# Patient Record
Sex: Female | Born: 1960 | Race: Black or African American | Hispanic: No | Marital: Single | Smoking: Former smoker
Health system: Southern US, Community
[De-identification: ages and names within clinical notes are randomized; demographics above are authoritative.]

## PROBLEM LIST (undated history)

## (undated) DIAGNOSIS — K5090 Crohn's disease, unspecified, without complications: Secondary | ICD-10-CM

## (undated) DIAGNOSIS — M8700 Idiopathic aseptic necrosis of unspecified bone: Secondary | ICD-10-CM

## (undated) DIAGNOSIS — K912 Postsurgical malabsorption, not elsewhere classified: Secondary | ICD-10-CM

## (undated) DIAGNOSIS — N183 Chronic kidney disease, stage 3 unspecified: Secondary | ICD-10-CM

## (undated) DIAGNOSIS — K219 Gastro-esophageal reflux disease without esophagitis: Secondary | ICD-10-CM

## (undated) DIAGNOSIS — K5790 Diverticulosis of intestine, part unspecified, without perforation or abscess without bleeding: Secondary | ICD-10-CM

## (undated) DIAGNOSIS — I10 Essential (primary) hypertension: Secondary | ICD-10-CM

## (undated) DIAGNOSIS — F32A Depression, unspecified: Secondary | ICD-10-CM

## (undated) DIAGNOSIS — E46 Unspecified protein-calorie malnutrition: Secondary | ICD-10-CM

## (undated) DIAGNOSIS — M810 Age-related osteoporosis without current pathological fracture: Secondary | ICD-10-CM

## (undated) DIAGNOSIS — G894 Chronic pain syndrome: Secondary | ICD-10-CM

## (undated) DIAGNOSIS — R222 Localized swelling, mass and lump, trunk: Secondary | ICD-10-CM

## (undated) DIAGNOSIS — H269 Unspecified cataract: Secondary | ICD-10-CM

## (undated) DIAGNOSIS — K859 Acute pancreatitis without necrosis or infection, unspecified: Secondary | ICD-10-CM

## (undated) DIAGNOSIS — F329 Major depressive disorder, single episode, unspecified: Secondary | ICD-10-CM

## (undated) DIAGNOSIS — E538 Deficiency of other specified B group vitamins: Secondary | ICD-10-CM

## (undated) DIAGNOSIS — D509 Iron deficiency anemia, unspecified: Secondary | ICD-10-CM

## (undated) DIAGNOSIS — F331 Major depressive disorder, recurrent, moderate: Principal | ICD-10-CM

## (undated) DIAGNOSIS — H6521 Chronic serous otitis media, right ear: Secondary | ICD-10-CM

## (undated) HISTORY — DX: Age-related osteoporosis without current pathological fracture: M81.0

## (undated) HISTORY — DX: Depression, unspecified: F32.A

## (undated) HISTORY — DX: Unspecified protein-calorie malnutrition: E46

## (undated) HISTORY — DX: Gastro-esophageal reflux disease without esophagitis: K21.9

## (undated) HISTORY — PX: IR US GUIDE VASC ACCESS LEFT: IMG2389

## (undated) HISTORY — DX: Unspecified cataract: H26.9

## (undated) HISTORY — PX: CHEST WALL RESECTION: SHX914

## (undated) HISTORY — PX: SMALL INTESTINE SURGERY: SHX150

## (undated) HISTORY — DX: Localized swelling, mass and lump, trunk: R22.2

## (undated) HISTORY — DX: Essential (primary) hypertension: I10

## (undated) HISTORY — DX: Crohn's disease, unspecified, without complications: K50.90

## (undated) HISTORY — DX: Idiopathic aseptic necrosis of unspecified bone: M87.00

## (undated) HISTORY — DX: Diverticulosis of intestine, part unspecified, without perforation or abscess without bleeding: K57.90

## (undated) HISTORY — PX: KNEE SURGERY: SHX244

## (undated) HISTORY — PX: UPPER GASTROINTESTINAL ENDOSCOPY: SHX188

## (undated) HISTORY — DX: Chronic pain syndrome: G89.4

## (undated) HISTORY — PX: ABDOMINAL HYSTERECTOMY: SHX81

## (undated) HISTORY — PX: ABDOMINAL SURGERY: SHX537

## (undated) HISTORY — DX: Acute pancreatitis without necrosis or infection, unspecified: K85.90

## (undated) HISTORY — PX: CHOLECYSTECTOMY: SHX55

## (undated) HISTORY — DX: Deficiency of other specified B group vitamins: E53.8

## (undated) HISTORY — DX: Iron deficiency anemia, unspecified: D50.9

---
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
## 1982-11-26 DIAGNOSIS — K50819 Crohn's disease of both small and large intestine with unspecified complications: Secondary | ICD-10-CM

## 1982-11-26 DIAGNOSIS — K50818 Crohn's disease of both small and large intestine with other complication: Secondary | ICD-10-CM

## 1982-11-26 HISTORY — DX: Crohn's disease of both small and large intestine with unspecified complications: K50.819

## 1982-11-26 HISTORY — PX: SALPINGOOPHORECTOMY: SHX82

## 1982-11-26 HISTORY — PX: PARTIAL HYSTERECTOMY: SHX80

## 1987-11-27 HISTORY — PX: APPENDECTOMY: SHX54

## 1988-11-26 HISTORY — PX: SALPINGOOPHORECTOMY: SHX82

## 1988-11-26 HISTORY — PX: TOTAL ABDOMINAL HYSTERECTOMY: SHX209

## 1999-03-27 HISTORY — PX: ILEOCECETOMY: SHX5857

## 1999-04-27 HISTORY — PX: ENTEROSTOMY CLOSURE: SHX935

## 1999-11-27 HISTORY — PX: ILEOSTOMY CLOSURE: SHX1784

## 2000-11-26 HISTORY — PX: SMALL INTESTINE SURGERY: SHX150

## 2001-11-26 HISTORY — PX: SMALL INTESTINE SURGERY: SHX150

## 2003-08-12 DIAGNOSIS — IMO0001 Reserved for inherently not codable concepts without codable children: Secondary | ICD-10-CM | POA: Insufficient documentation

## 2007-11-27 HISTORY — PX: LAPAROSCOPIC SMALL BOWEL RESECTION: SUR793

## 2011-06-01 DIAGNOSIS — M792 Neuralgia and neuritis, unspecified: Secondary | ICD-10-CM | POA: Insufficient documentation

## 2011-06-01 DIAGNOSIS — K5090 Crohn's disease, unspecified, without complications: Secondary | ICD-10-CM | POA: Insufficient documentation

## 2011-12-13 DIAGNOSIS — R197 Diarrhea, unspecified: Secondary | ICD-10-CM | POA: Insufficient documentation

## 2012-07-24 DIAGNOSIS — R7881 Bacteremia: Secondary | ICD-10-CM | POA: Insufficient documentation

## 2012-07-24 DIAGNOSIS — K565 Intestinal adhesions [bands], unspecified as to partial versus complete obstruction: Secondary | ICD-10-CM | POA: Insufficient documentation

## 2012-07-24 DIAGNOSIS — F32A Depression, unspecified: Secondary | ICD-10-CM | POA: Insufficient documentation

## 2012-11-26 DIAGNOSIS — Z8719 Personal history of other diseases of the digestive system: Secondary | ICD-10-CM

## 2014-05-17 LAB — COMP METABOLIC/HEPAT
ALT: 23 U/L (ref 12–78)
AST: 22 U/L (ref 15–37)
Albumin,Serum: 3.4 g/dL (ref 3.1–4.6)
Alkaline Phosphatase: 115 U/L (ref 45–117)
Anion Gap: 12
BUN: 22 mg/dL (ref 7–25)
Bilirubin, Direct: 0.2 mg/dL (ref 0.0–0.2)
CO2: 22 mmol/L (ref 21–32)
Calcium: 8.5 mg/dL (ref 8.2–10.1)
Chloride: 108 mmol/L (ref 98–109)
Creatinine: 1.07 mg/dL (ref 0.60–1.50)
EGFR IF NonAfrican American: 53.6 mL/min (ref >60)
Glucose: 78 mg/dL (ref 70–100)
Potassium: 3.4 mmol/L — ABNORMAL LOW (ref 3.5–5.1)
Sodium: 142 mmol/L (ref 135–145)
Total Bilirubin: 0.4 mg/dL (ref 0.2–1.0)
Total Protein: 7 g/dL (ref 6.4–8.2)
eGFR African American: >60 mL/min (ref >60)

## 2014-05-17 LAB — CBC WITH AUTO DIFFERENTIAL
Absolute Baso #: 0 10*3/uL (ref 0.0–0.2)
Absolute Eos #: 0.1 10*3/uL (ref 0.0–0.5)
Absolute Lymph #: 2 10*3/uL (ref 1.0–4.3)
Absolute Mono #: 0.2 10*3/uL (ref 0.0–0.8)
Absolute Neut #: 1.5 10*3/uL — ABNORMAL LOW (ref 2.2–8.2)
Basophils: 0.5 %
Eosinophils: 1.7 %
Granulocytes %: 39.8 %
Hematocrit: 25.3 % — ABNORMAL LOW (ref 35.0–47.0)
Hemoglobin: 8.1 g/dl — ABNORMAL LOW (ref 11.7–16.0)
Lymphocyte %: 52.9 %
MCH: 27.9 pg (ref 26.0–34.0)
MCHC: 32.1 % (ref 32.0–36.0)
MCV: 86.7 fl (ref 79.0–98.0)
MPV: 10 fl (ref 7.4–10.4)
Monocytes: 5.1 %
Platelets: 250 10*3/uL (ref 140–440)
RBC: 2.91 10*6/uL — ABNORMAL LOW (ref 3.80–5.20)
RDW: 15.2 % — ABNORMAL HIGH (ref 11.5–14.5)
WBC: 3.8 10*3/uL (ref 3.6–10.7)

## 2014-05-17 LAB — COMPREHENSIVE METABOLIC PANEL
ALT: 23 U/L (ref 12–78)
AST: 23 U/L (ref 15–37)
Albumin,Serum: 3.4 g/dL (ref 3.1–4.6)
Alkaline Phosphatase: 112 U/L (ref 45–117)
Anion Gap: 12
BUN: 22 mg/dL (ref 7–25)
CO2: 22 mmol/L (ref 21–32)
Calcium: 8.4 mg/dL (ref 8.2–10.1)
Chloride: 108 mmol/L (ref 98–109)
Creatinine: 1.03 mg/dL (ref 0.60–1.50)
EGFR IF NonAfrican American: 56 mL/min (ref >60)
Glucose: 78 mg/dL (ref 70–100)
Potassium: 3.6 mmol/L (ref 3.5–5.1)
Sodium: 142 mmol/L (ref 135–145)
Total Bilirubin: 0.4 mg/dL (ref 0.2–1.0)
Total Protein: 7 g/dL (ref 6.4–8.2)
eGFR African American: >60 mL/min (ref >60)

## 2014-05-17 LAB — MAGNESIUM: Magnesium: 1.9 mg/dL (ref 1.8–2.4)

## 2014-05-17 LAB — TRIGLYCERIDES: Triglycerides: 111 mg/dL (ref <150)

## 2014-05-17 LAB — PHOSPHORUS: Phosphorus: 3.8 mg/dL (ref 2.5–5.0)

## 2014-05-17 LAB — PROTHROMBIN TIME (PT)
INR: 0.98 (ref 0.90–1.10)
Protime: 10 s (ref 9.0–12.0)

## 2014-05-18 LAB — PREALBUMIN: Prealbumin: 27.2 mg/dL (ref 20.0–40.0)

## 2014-06-16 LAB — CBC
Hematocrit: 25.6 % — ABNORMAL LOW (ref 35.0–47.0)
Hemoglobin: 8.3 g/dl — ABNORMAL LOW (ref 11.7–16.0)
MCH: 26.9 pg (ref 26.0–34.0)
MCHC: 32.3 % (ref 32.0–36.0)
MCV: 83.4 fl (ref 79.0–98.0)
MPV: 9.7 fl (ref 7.4–10.4)
Platelets: 240 10*3/uL (ref 140–440)
RBC: 3.07 10*6/uL — ABNORMAL LOW (ref 3.80–5.20)
RDW: 15.7 % — ABNORMAL HIGH (ref 11.5–14.5)
WBC: 4.2 10*3/uL (ref 3.6–10.7)

## 2014-07-15 DIAGNOSIS — K90829 Short bowel syndrome, unspecified: Secondary | ICD-10-CM

## 2014-07-15 DIAGNOSIS — K912 Postsurgical malabsorption, not elsewhere classified: Secondary | ICD-10-CM | POA: Insufficient documentation

## 2014-07-15 HISTORY — DX: Postsurgical malabsorption, not elsewhere classified: K91.2

## 2014-07-15 HISTORY — DX: Short bowel syndrome, unspecified: K90.829

## 2014-07-15 NOTE — Progress Notes (Signed)
Subjective:      Patient ID: Angel Stevens is a 53 y.o. female.    HPI   The patient is a 53 year old female who presents for ongoing surgical monitoring of longstanding Crohn's disease.  The patient receives most of her care through [facility].  She has been in the hospital at [facility] each of the last three months and has received nine units of blood and IV iron during that time.  She currently has no change in her GI or general symptoms from baseline.  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  Over 51% of the office visit was spent in counseling in a 45 minute office visit.  The issues concerning SB transplant are discussed in depth with the patient and all of her questions are answered.  She is interested but intends to proceed slowly and cautiously with the process.  The patient is seen today with Ammerist Dorna MaiKrantz MA.    Review of Systems   Constitutional: Positive for fever, chills and fatigue. Negative for diaphoresis, activity change, appetite change and unexpected weight change.   Eyes: Negative for visual disturbance.   Respiratory: Positive for cough and shortness of breath.    Cardiovascular: Negative for chest pain, palpitations and leg swelling.   Gastrointestinal: Positive for nausea, abdominal pain, diarrhea, blood in stool and abdominal distention. Negative for vomiting, constipation, anal bleeding and rectal pain.   Genitourinary: Negative for difficulty urinating.   Musculoskeletal: Positive for myalgias and arthralgias.   Skin: Negative for rash.   Neurological: Negative for dizziness, seizures and headaches.   Hematological: Negative for adenopathy. Does not bruise/bleed easily.   Psychiatric/Behavioral: The patient is nervous/anxious.        Objective:   Physical Exam   Constitutional: She is oriented to person, place, and time. She appears well-developed and well-nourished. No distress.    HENT:   Head: Normocephalic and atraumatic.   Eyes: Conjunctivae and EOM are normal. Pupils are equal, round, and reactive to light.   Neck: Normal range of motion. Neck supple. No JVD present. No tracheal deviation present. No thyromegaly present.   Cardiovascular: Normal rate, regular rhythm, normal heart sounds and intact distal pulses.  Exam reveals no gallop and no friction rub.    No murmur heard.  Pulmonary/Chest: Effort normal and breath sounds normal. No stridor. No respiratory distress. She has no wheezes. She has no rales. She exhibits no tenderness.   Abdominal: Soft. Bowel sounds are normal. She exhibits no distension and no mass. There is no tenderness. There is no rebound and no guarding.   Musculoskeletal: Normal range of motion. She exhibits no edema or tenderness.   Lymphadenopathy:     She has no cervical adenopathy.   Neurological: She is alert and oriented to person, place, and time. No cranial nerve deficit. She exhibits normal muscle tone. Coordination normal.   Skin: Skin is warm and dry. No rash noted. She is not diaphoretic. No erythema. No pallor.   Psychiatric: She has a normal mood and affect. Her behavior is normal. Judgment and thought content normal.       Assessment:      1.  Short gut syndrome  2.  Malnutrition  3.  History of Crohn's disease without current activity       Plan:      1.  Discussion of possible intestinal transplant as noted above.  Patient  is considering the matter closely and intends to proceed with initial phases of investigation.  2.  Continue current medication regimen and health care plans.  3.  Office followup in 3 months.

## 2014-08-02 LAB — APTT: aPTT: 24.8 s (ref 20.0–30.5)

## 2014-08-02 LAB — COMPREHENSIVE METABOLIC PANEL
ALT: 27 U/L (ref 12–78)
AST: 23 U/L (ref 15–37)
Albumin,Serum: 3.5 g/dL (ref 3.1–4.6)
Alkaline Phosphatase: 124 U/L — ABNORMAL HIGH (ref 45–117)
Anion Gap: 9
BUN: 21 mg/dL (ref 7–25)
CO2: 25 mmol/L (ref 21–32)
Calcium: 8.7 mg/dL (ref 8.2–10.1)
Chloride: 108 mmol/L (ref 98–109)
Creatinine: 0.82 mg/dL (ref 0.60–1.50)
EGFR IF NonAfrican American: >60 mL/min (ref >60)
Glucose: 76 mg/dL (ref 70–100)
Potassium: 3.8 mmol/L (ref 3.5–5.1)
Sodium: 142 mmol/L (ref 135–145)
Total Bilirubin: 0.4 mg/dL (ref 0.2–1.0)
Total Protein: 6.9 g/dL (ref 6.4–8.2)
eGFR African American: >60 mL/min (ref >60)

## 2014-08-02 LAB — CBC
Hematocrit: 29.9 % — ABNORMAL LOW (ref 35.0–47.0)
Hemoglobin: 9.8 g/dl — ABNORMAL LOW (ref 11.7–16.0)
MCH: 27.8 pg (ref 26.0–34.0)
MCHC: 32.9 % (ref 32.0–36.0)
MCV: 84.7 fl (ref 79.0–98.0)
MPV: 9.9 fl (ref 7.4–10.4)
Platelets: 173 10*3/uL (ref 140–440)
RBC: 3.53 10*6/uL — ABNORMAL LOW (ref 3.80–5.20)
RDW: 21.2 % — ABNORMAL HIGH (ref 11.5–14.5)
WBC: 4 10*3/uL (ref 3.6–10.7)

## 2014-08-02 LAB — TRIGLYCERIDES: Triglycerides: 92 mg/dL (ref <150)

## 2014-08-02 LAB — PROTIME-INR
INR: 0.9 (ref 0.9–1.1)
Protime: 9.5 s (ref 9.0–12.0)

## 2014-08-02 LAB — PREALBUMIN: Prealbumin: 23.3 mg/dL (ref 20.0–40.0)

## 2014-08-02 LAB — PHOSPHORUS: Phosphorus: 3.7 mg/dL (ref 2.5–5.0)

## 2014-08-02 LAB — MAGNESIUM: Magnesium: 2.1 mg/dL (ref 1.8–2.4)

## 2014-09-04 LAB — VANCOMYCIN,RANDOM: Vancomycin: 25.4 ug/mL — ABNORMAL HIGH (ref 15.0–20.0)

## 2014-10-12 ENCOUNTER — Encounter: Attending: Colon & Rectal Surgery | Primary: Family Medicine

## 2014-11-10 ENCOUNTER — Ambulatory Visit
Admit: 2014-11-10 | Discharge: 2014-11-10 | Payer: MEDICARE | Attending: Colon & Rectal Surgery | Primary: Family Medicine

## 2014-11-10 DIAGNOSIS — K912 Postsurgical malabsorption, not elsewhere classified: Secondary | ICD-10-CM

## 2014-11-10 NOTE — Progress Notes (Signed)
Subjective:      Patient ID: Angel Stevens is a 53 y.o. female.    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  Most of her care is through [facility].  She has a new baseline.  She has been hospitalized for about a week every month.  Preparations for eventual intestinal transplantation are in full swing.  She is being evaluated for resection of a long standing lung mass in anticipation of immunosuppression for transplantation.  She reports no new symptoms.  She is fatigued at baseline.  She is on nighttime TPN.  She continues to have occasional line infections.  She is stable today.  She is seen in the office today with Leighton Ruffmy Hutton CMA.    Review of Systems   Constitutional: Positive for appetite change (decreased). Negative for fever, chills, diaphoresis, activity change, fatigue and unexpected weight change.   Eyes: Negative for visual disturbance.   Respiratory: Negative for cough and shortness of breath.    Cardiovascular: Negative for chest pain, palpitations and leg swelling.   Gastrointestinal: Positive for nausea, abdominal pain, diarrhea, abdominal distention and rectal pain. Negative for vomiting, constipation, blood in stool and anal bleeding.   Genitourinary: Negative for difficulty urinating.   Musculoskeletal: Positive for myalgias and arthralgias.   Skin: Negative for rash.   Neurological: Negative for dizziness, seizures and headaches.   Hematological: Negative for adenopathy. Does not bruise/bleed easily.   Psychiatric/Behavioral: The patient is nervous/anxious.        Objective:   Physical Exam   Constitutional: She is oriented to person, place, and time. No distress.   HENT:   Head: Normocephalic and atraumatic.   Eyes: Conjunctivae are normal. Pupils are equal, round, and reactive to light. No scleral icterus.   Neck: Normal range of motion. Neck supple. No JVD present. No tracheal deviation present. No thyromegaly present.    Cardiovascular: Normal rate, regular rhythm, normal heart sounds and intact distal pulses.  Exam reveals no gallop and no friction rub.    No murmur heard.  Pulmonary/Chest: Effort normal and breath sounds normal. No stridor. No respiratory distress. She has no wheezes. She has no rales. She exhibits no tenderness.   Abdominal: Soft. Bowel sounds are normal. She exhibits no distension and no mass. There is tenderness. There is guarding. There is no rebound.   Musculoskeletal: She exhibits no edema or tenderness.   Lymphadenopathy:     She has no cervical adenopathy.   Neurological: She is alert and oriented to person, place, and time.   Skin: Skin is warm and dry. She is not diaphoretic.   Psychiatric: She has a normal mood and affect. Her behavior is normal. Judgment and thought content normal.       Assessment:      1.  Short gut syndrome  2.  History of Crohn's disease  3.  Consideration of resection longstanding lung mass before intestinal transplant  4.  Consideration for intestinal transplantation       Plan:      1.  Discussion of the patient's various medical issues is held lasting 30 minutes.  Over half of the visit consisted of counseling.  2.  The patient is appropriately informed regarding the intestinal transplantation process, and is planning to undergo resection of the lung mass near March 2016 and intestinal transplantation sometime in 2016.  3.  Office visit in 3 months

## 2014-11-16 LAB — PHOSPHORUS: Phosphorus: 2.3 mg/dL — ABNORMAL LOW (ref 2.5–5.0)

## 2014-11-16 LAB — COMPREHENSIVE METABOLIC PANEL
ALT: 30 U/L (ref 12–78)
AST: 20 U/L (ref 15–37)
Albumin,Serum: 3.7 g/dL (ref 3.1–4.6)
Alkaline Phosphatase: 124 U/L — ABNORMAL HIGH (ref 45–117)
Anion Gap: 8
BUN: 17 mg/dL (ref 7–25)
CO2: 24 mmol/L (ref 21–32)
Calcium: 8.7 mg/dL (ref 8.2–10.1)
Chloride: 106 mmol/L (ref 98–109)
Creatinine: 0.85 mg/dL (ref 0.60–1.50)
EGFR IF NonAfrican American: >60 mL/min (ref >60)
Glucose: 82 mg/dL (ref 70–100)
Potassium: 3.3 mmol/L — ABNORMAL LOW (ref 3.5–5.1)
Sodium: 138 mmol/L (ref 135–145)
Total Bilirubin: 0.6 mg/dL (ref 0.2–1.0)
Total Protein: 7 g/dL (ref 6.4–8.2)
eGFR African American: >60 mL/min (ref >60)

## 2014-11-16 LAB — PREALBUMIN: Prealbumin: 27 mg/dL (ref 20.0–40.0)

## 2014-11-16 LAB — CBC
Hematocrit: 31.1 % — ABNORMAL LOW (ref 35.0–47.0)
Hemoglobin: 10.1 g/dl — ABNORMAL LOW (ref 11.7–16.0)
MCH: 30.4 pg (ref 26.0–34.0)
MCHC: 32.6 % (ref 32.0–36.0)
MCV: 93.3 fl (ref 79.0–98.0)
MPV: 9.7 fl (ref 7.4–10.4)
Platelets: 198 10*3/uL (ref 140–440)
RBC: 3.33 10*6/uL — ABNORMAL LOW (ref 3.80–5.20)
RDW: 13.4 % (ref 11.5–14.5)
WBC: 4.3 10*3/uL (ref 3.6–10.7)

## 2014-11-16 LAB — MAGNESIUM: Magnesium: 1.5 mg/dL — ABNORMAL LOW (ref 1.8–2.4)

## 2014-11-16 LAB — PROTIME/INR & PTT
INR: 0.96 s (ref 0.90–1.10)
Protime: 9.8 s (ref 9.0–12.0)
aPTT: 25.8 s (ref 20.0–30.5)

## 2014-11-16 LAB — TRIGLYCERIDES: Triglycerides: 113 mg/dL (ref <150)

## 2014-12-01 ENCOUNTER — Ambulatory Visit: Admit: 2014-12-01 | Discharge: 2014-12-01 | Payer: MEDICARE | Attending: Registered Nurse | Primary: Family Medicine

## 2014-12-01 DIAGNOSIS — F331 Major depressive disorder, recurrent, moderate: Secondary | ICD-10-CM

## 2014-12-01 MED ORDER — DULOXETINE HCL 30 MG PO CPEP
30 MG | ORAL_CAPSULE | Freq: Every day | ORAL | Status: DC
Start: 2014-12-01 — End: 2015-06-01

## 2014-12-01 MED ORDER — TRAZODONE HCL 100 MG PO TABS
100 MG | ORAL_TABLET | Freq: Every evening | ORAL | Status: DC
Start: 2014-12-01 — End: 2015-06-01

## 2014-12-01 MED ORDER — BUPROPION HCL ER (SR) 100 MG PO TB12
100 MG | ORAL_TABLET | Freq: Two times a day (BID) | ORAL | Status: DC
Start: 2014-12-01 — End: 2015-06-01

## 2014-12-01 NOTE — Telephone Encounter (Signed)
-----   Message from Erma Pintoebecca Myers sent at 12/01/2014  8:55 AM EST -----  Contact: [name]  Pt called in needing to make an appt with Dr. Kathryne Ginullado. Pt is stating that she is having pain in her colon on the left side.      Confirmed phone number

## 2014-12-01 NOTE — Progress Notes (Signed)
Subjective:        Chief Complaints:      Follow up          HPI:   Visit Duration: 11:40/12:10/3030min  Interim History: Pt present for f/u appt.  States she has made the decision to have a transplant of small bowel.  This is after much research and consultation with physician. As pt states her "gut is fried" and they think if they do not do something the infections will eventually kill her.  Pt is hopeful for treatment as she states the team is very capable. Pt looking for a caregiver, required by hospital post surgery.  This person needs to sign a consent and commit to a 2 mo time period.  Would like to find a family member or friend. Overall daughters stable.  Grand kids doing better.  Vonna KotykJay is doing better in school.  [name] has calmed down and has a counselor comes weekly.  Worry about twin sister- she has MS and recently developed a bed sore from lack of movement, subservient care.  Pt's physical health continues to greatly affect mental health.  Sleep is often poor as she is up to BR throughout the night, Appetite fair- due to abdominal issues.  Pt reports she was in the hospital several times in last few months.  Offered much support and encouragement.  Denies difficulties with meds.  Will f/u in approx 6 mo. Denies SI HI     Target Signs/Symptoms: anxiety, depression            Gyn History: n/a          Medications:   Current Outpatient Prescriptions   Medication Sig Dispense Refill   ??? Cholecalciferol (VITAMIN D-3 PO) Take 400 Units by mouth     ??? Cyanocobalamin (VITAMIN B-12) 1000 MCG SUBL Place 1 tablet under the tongue     ??? cycloSPORINE (RESTASIS) 0.05 % ophthalmic emulsion Apply to eye     ??? dexlansoprazole (DEXILANT) 60 MG CPDR capsule Take by mouth     ??? hydrALAZINE (APRESOLINE) 50 MG tablet Take 50 mg by mouth three times daily     ??? amylase-lipase-protease (CREON) 24000 UNITS per capsule Take 1 capsule by mouth     ??? POTASSIUM CHLORIDE PO Take 10 mEq by mouth     ??? traZODone (DESYREL) 50 MG  tablet Take 100 mg by mouth     ??? buPROPion (WELLBUTRIN SR) 100 MG SR tablet Take 100 mg by mouth     ??? dicyclomine (BENTYL) 10 MG capsule Take 20 mg by mouth every 8 hours     ??? DULoxetine (CYMBALTA) 30 MG capsule Take 60 mg by mouth     ??? estrogens, conjugated, (PREMARIN) 1.25 MG tablet Take 1.25 mg by mouth     ??? hydrocortisone (PROCTOSOL HC) 2.5 % rectal cream Place rectally     ??? ibandronate (BONIVA) 150 MG tablet Take 150 mg by mouth     ??? metoprolol (LOPRESSOR) 100 MG tablet Take 100 mg by mouth     ??? mometasone (NASONEX) 50 MCG/ACT nasal spray one daily     ??? promethazine (PHENERGAN) 25 MG tablet Take 25 mg by mouth     ??? sucralfate (CARAFATE) 1 GM/10ML suspension Take 1 g by mouth     ??? TPN ADULT Infuse intravenously 10 hr bag once a day     ??? acyclovir (ZOVIRAX) 400 MG tablet      ??? vitamin D (CHOLECALCIFEROL) 400 UNITS TABS  tablet Take 400 Units by mouth daily       No current facility-administered medications for this visit.             Allergies:   Allergies   Allergen Reactions   ??? Fentanyl    ??? Gabapentin Other (See Comments)     Spasms, adema   ??? Levsin [Hyoscyamine Sulfate] Other (See Comments)     Disorientation   ??? Lyrica [Pregabalin]    ??? Morphine    ??? Other      All elixirs: stomach upset:   ??? Topamax [Topiramate]             Objective:        Examination:   Mental Status Exam:   GENERAL OBSERVATIONS :.   Appearance appropriately dressed and healthy looking   Demeanor  cooperative  Activity normal.   Eye Contact good    SPEECH   Rate normal  Volume  appropriate  Articulation clear.   Coherent yes.   Spontaneous no.   MOOD & AFFECT :.   Depression  moderate  Anxiety moderate  Anger mild  Anhedonia  mild  Euphoria denies  Irritability mild  Affect appropriate  Associations logical.   Process Goal-Directed  THOUGHT CONTENT :.   Hallucinations No  Delusions. No  Suicidality no plan  Homicidality no plan   Judgement fair   Insight Good  Orientation time yes person yes situation yes place yes.    Attention fair   Concentration fair   Memory Recent: intact Remote: intact.   Language Naming: intact Repetition: intact.   Condition:  not changed  Prognosis:  fair           Examination:   Mental Status Exam:                ASSESSMENT/PLAN:    Janai was seen today for anxiety and depression.    Diagnoses and associated orders for this visit:    Major depressive disorder, recurrent episode, moderate ([name])        FOLLOW-UP:  6 mo

## 2014-12-02 NOTE — Telephone Encounter (Signed)
Request for an appointment reviewed with Dr. Kathryne Ginullado. Recommendation is for patient to follow-up with her GI physician, keep 3 month follow-up with Dr. Kathryne Ginullado as scheduled in March.

## 2014-12-03 NOTE — Telephone Encounter (Signed)
Called to notify pt of Dr. Cullado's recommendation. Pt verbalized understanding.

## 2014-12-16 DIAGNOSIS — M545 Low back pain, unspecified: Secondary | ICD-10-CM | POA: Insufficient documentation

## 2014-12-16 DIAGNOSIS — E559 Vitamin D deficiency, unspecified: Secondary | ICD-10-CM | POA: Insufficient documentation

## 2014-12-24 DIAGNOSIS — M810 Age-related osteoporosis without current pathological fracture: Secondary | ICD-10-CM

## 2014-12-24 HISTORY — DX: Age-related osteoporosis without current pathological fracture: M81.0

## 2014-12-30 DIAGNOSIS — Z789 Other specified health status: Secondary | ICD-10-CM | POA: Diagnosis present

## 2015-02-09 ENCOUNTER — Ambulatory Visit
Admit: 2015-02-09 | Discharge: 2015-02-09 | Payer: MEDICARE | Attending: Colon & Rectal Surgery | Primary: Family Medicine

## 2015-02-09 DIAGNOSIS — K912 Postsurgical malabsorption, not elsewhere classified: Secondary | ICD-10-CM

## 2015-02-09 NOTE — Progress Notes (Signed)
Assessment:     1. SGS (short gut syndrome) ([name])     2. Crohn's disease of both small and large intestine with rectal bleeding ([name])                 Plan:     1.  Education and instructions are given and all questions answered.  Over 50% of the 15 minute office visit was spent in counseling.  2.  Office visit in three months       Objective  History:     Angel Stevens is a 54 y.o. 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  This process/decision is to be finalized within the next month or two.  The patient has decided to proceed with this treatment if offered.  She has a benign right pleural based lesion.  According to the patient, the next step will be to undergo right thoracotomy and resection of this mass.  She has recently been admitted to [facility] with sepsis.  She is doing well now.  She remains on TPN.  She is seen in the office today with Aisling Scientist, research (life sciences).          Physical Exam:   BP 130/84 mmHg   Pulse 84   Temp(Src) 98.6 ??F (37 ??C)   Ht  (1.753 m)   Wt 169 lb 1.6 oz (76.703 kg)   BMI 24.96 kg/m2    Physical Exam   Constitutional: She is oriented to person, place, and time. She appears well-developed and well-nourished. No distress.   HENT:   Head: Atraumatic.   Eyes: Conjunctivae are normal. Pupils are equal, round, and reactive to light. No scleral icterus.   Neck: Normal range of motion. Neck supple. No JVD present. No tracheal deviation present. No thyromegaly present.   Cardiovascular: Normal rate, regular rhythm, normal heart sounds and intact distal pulses.  Exam reveals no gallop and no friction rub.    No murmur heard.  Pulmonary/Chest: Effort normal and breath sounds normal. No stridor. No respiratory distress. She has no wheezes. She has no rales. She exhibits no tenderness.   Abdominal: Soft. Bowel sounds are normal. She exhibits no distension and no mass. There is no  tenderness. There is no rebound and no guarding.   Musculoskeletal: Normal range of motion. She exhibits no edema or tenderness.   Lymphadenopathy:     She has no cervical adenopathy.   Neurological: She is alert and oriented to person, place, and time. Coordination normal.   Skin: Skin is warm and dry. No rash noted. She is not diaphoretic. No erythema. No pallor.   Psychiatric: She has a normal mood and affect. Her behavior is normal. Judgment and thought content normal.         ROS: Review of Systems as recorded by the medical assistant has been reviewed by me, and I agree except as noted in the HPI.    Past Medical History   Diagnosis Date   ??? Hypertension    ??? Depression    ??? Crohn disease ([name])    ??? Short gut syndrome    ??? Osteoarthritis    ??? Chronic abdominal pain        Past Surgical History   Procedure Laterality Date   ??? Colonoscopy     ??? Hysterectomy     ??? Knee surgery     ??? Abdomen surgery  Current Outpatient Prescriptions   Medication Sig Dispense Refill   ??? cycloSPORINE (RESTASIS) 0.05 % ophthalmic emulsion Use 1 Drop in both eyes twice daily.     ??? Iron Sucrose (VENOFER IV) Infuse 5 mL/mL intravenously every 30 days     ??? Zoledronic Acid (RECLAST IV) Infuse intravenously Indications: Pt undure of dosage     ??? Diphenoxylate-Atropine (LOMOTIL PO) Take by mouth     ??? traZODone (DESYREL) 100 MG tablet Take 2 tablets by mouth nightly 180 tablet 1   ??? buPROPion (WELLBUTRIN SR) 100 MG SR tablet Take 1 tablet by mouth 2 times daily 180 tablet 1   ??? DULoxetine (CYMBALTA) 30 MG capsule Take 2 capsules by mouth daily 180 capsule 1   ??? Cholecalciferol (VITAMIN D-3 PO) Take 400 Units by mouth     ??? Cyanocobalamin (VITAMIN B-12) 1000 MCG SUBL Place 1 tablet under the tongue     ??? POTASSIUM CHLORIDE PO Take 10 mEq by mouth     ??? dicyclomine (BENTYL) 10 MG capsule Take 20 mg by mouth every 8 hours     ??? hydrocortisone (PROCTOSOL HC) 2.5 % rectal cream Place rectally     ??? metoprolol (LOPRESSOR) 100 MG tablet  Take 100 mg by mouth     ??? mometasone (NASONEX) 50 MCG/ACT nasal spray one daily     ??? promethazine (PHENERGAN) 25 MG tablet Take 25 mg by mouth     ??? sucralfate (CARAFATE) 1 GM/10ML suspension Take 1 g by mouth     ??? vitamin D (CHOLECALCIFEROL) 400 UNITS TABS tablet Take 400 Units by mouth daily     ??? dexlansoprazole (DEXILANT) 60 MG CPDR capsule Take by mouth     ??? sodium chloride 0.9 % solution      ??? oxyCODONE-acetaminophen (PERCOCET) 5-325 MG per tablet   0   ??? estradiol (ESTRACE) 1 MG tablet   5   ??? hydrALAZINE (APRESOLINE) 50 MG tablet Take 50 mg by mouth three times daily     ??? amylase-lipase-protease (CREON) 24000 UNITS per capsule Take 1 capsule by mouth     ??? estrogens, conjugated, (PREMARIN) 1.25 MG tablet Take 1.25 mg by mouth     ??? ibandronate (BONIVA) 150 MG tablet Take 150 mg by mouth     ??? TPN ADULT Infuse intravenously 10 hr bag once a day       No current facility-administered medications for this visit.       Allergies:  Allergies   Allergen Reactions   ??? Fentanyl    ??? Gabapentin Other (See Comments)     Spasms, adema   ??? Levsin [Hyoscyamine Sulfate] Other (See Comments)     Disorientation   ??? Lyrica [Pregabalin]    ??? Morphine    ??? Other      All elixirs: stomach upset:   ??? Topamax [Topiramate]        History     Social History   ??? Marital Status: Single     Spouse Name: N/A     Number of Children: N/A   ??? Years of Education: N/A     Occupational History   ??? Not on file.     Social History Main Topics   ??? Smoking status: Former Smoker   ??? Smokeless tobacco: Not on file      Comment: Pt quit smoking 5-10 yrs ago   ??? Alcohol Use: Yes      Comment: occasional   ??? Drug Use: No   ???  Sexual Activity: Not on file     Other Topics Concern   ??? Not on file     Social History Narrative       Family History   Problem Relation Age of Onset   ??? High Blood Pressure Father              Electronically signed by Lindell NoeMichael Vishruth Seoane, MD on 02/09/2015 at 6:14 PM.

## 2015-02-09 NOTE — Progress Notes (Signed)
Review of Systems   Constitutional: Positive for activity change and appetite change. Negative for fever, chills, fatigue and unexpected weight change.   Respiratory: Negative for cough, chest tightness, shortness of breath and wheezing.    Cardiovascular: Negative for chest pain, palpitations and leg swelling.   Gastrointestinal: Positive for nausea, vomiting, abdominal pain, diarrhea and rectal pain. Negative for constipation and blood in stool.   Genitourinary: Negative for dysuria and difficulty urinating.   Musculoskeletal: Positive for myalgias, joint swelling and arthralgias.   Skin: Negative for rash and wound.   Allergic/Immunologic: Negative for environmental allergies, food allergies and immunocompromised state.   Neurological: Positive for weakness. Negative for dizziness and light-headedness.   Hematological: Negative for adenopathy. Does not bruise/bleed easily.   Psychiatric/Behavioral: Negative for suicidal ideas and self-injury.

## 2015-04-27 LAB — COMPREHENSIVE METABOLIC PANEL
ALT: 47 U/L (ref 12–78)
AST: 26 U/L (ref 15–37)
Albumin,Serum: 3.5 g/dL (ref 3.4–5.0)
Alkaline Phosphatase: 121 U/L — ABNORMAL HIGH (ref 45–117)
Anion Gap: 9 NA
BUN: 24 mg/dL (ref 7–25)
CO2: 27 mmol/L (ref 21–32)
Calcium: 8.4 mg/dL (ref 8.2–10.1)
Chloride: 109 mmol/L (ref 98–109)
Creatinine: 1.13 mg/dL (ref 0.55–1.40)
EGFR IF NonAfrican American: 50.2 mL/min (ref >60)
Glucose: 68 mg/dL — ABNORMAL LOW (ref 70–100)
Potassium: 3.4 mmol/L — ABNORMAL LOW (ref 3.5–5.1)
Sodium: 145 mmol/L (ref 135–145)
Total Bilirubin: 0.4 mg/dL (ref 0.2–1.0)
Total Protein: 6.9 g/dL (ref 6.4–8.2)
eGFR African American: >60 mL/min (ref >60)

## 2015-04-27 LAB — MAGNESIUM: Magnesium: 2.1 mg/dL (ref 1.8–2.4)

## 2015-04-27 LAB — PHOSPHORUS: Phosphorus: 2.6 mg/dL (ref 2.5–4.9)

## 2015-04-27 LAB — CBC WITH AUTO DIFFERENTIAL
Absolute Baso #: 0 10*3/uL (ref 0.0–0.2)
Absolute Eos #: 0.1 10*3/uL (ref 0.0–0.5)
Absolute Lymph #: 2.1 10*3/uL (ref 1.0–4.3)
Absolute Mono #: 0.3 10*3/uL (ref 0.0–0.8)
Absolute Neut #: 3 10*3/uL (ref 1.8–7.0)
Basophils: 0.3 %
Eosinophils: 0.9 %
Granulocytes %: 54.8 %
Hematocrit: 32.7 % — ABNORMAL LOW (ref 35.0–47.0)
Hemoglobin: 10.7 g/dL — ABNORMAL LOW (ref 11.7–16.0)
Lymphocyte %: 38.2 %
MCH: 31 pg (ref 26.0–34.0)
MCHC: 32.7 % (ref 32.0–36.0)
MCV: 94.9 fL (ref 79.0–98.0)
MPV: 9.2 fL (ref 7.4–10.4)
Monocytes: 5.8 %
Platelets: 204 10*3/uL (ref 140–440)
RBC: 3.45 10*6/uL — ABNORMAL LOW (ref 3.80–5.20)
RDW: 13.6 % (ref 11.5–14.5)
WBC: 5.6 10*3/uL (ref 3.6–10.7)

## 2015-04-27 LAB — TRIGLYCERIDES: Triglycerides: 154 mg/dL — AB (ref <150)

## 2015-04-27 LAB — PREALBUMIN: Prealbumin: 34.1 mg/dL (ref 20.0–40.0)

## 2015-05-03 DIAGNOSIS — K5080 Crohn's disease of both small and large intestine without complications: Secondary | ICD-10-CM | POA: Insufficient documentation

## 2015-05-03 DIAGNOSIS — M2550 Pain in unspecified joint: Secondary | ICD-10-CM | POA: Insufficient documentation

## 2015-05-11 ENCOUNTER — Encounter: Attending: Colon & Rectal Surgery | Primary: Family Medicine

## 2015-05-11 NOTE — Telephone Encounter (Signed)
-----   Message from Charmaine Downs sent at 05/11/2015 11:47 AM EDT -----  Contact: Byrd Hesselbach  Pt called to state that she would be late to her appt around 10:00. I notified the pt that we would need to r/s. Pt is having surgery on 06/13/15 and will call to schedule after she recovers from surgery.

## 2015-05-12 NOTE — Telephone Encounter (Signed)
Dr. Kathryne Gin informed, nothing additional to do at this time, will await the patient making contact with the office to schedule.

## 2015-06-01 ENCOUNTER — Ambulatory Visit: Admit: 2015-06-01 | Discharge: 2015-06-01 | Payer: MEDICARE | Attending: Registered Nurse | Primary: Family Medicine

## 2015-06-01 DIAGNOSIS — F331 Major depressive disorder, recurrent, moderate: Secondary | ICD-10-CM

## 2015-06-01 MED ORDER — BUPROPION HCL ER (SR) 100 MG PO TB12
100 MG | ORAL_TABLET | Freq: Two times a day (BID) | ORAL | 1 refills | Status: DC
Start: 2015-06-01 — End: 2015-12-02

## 2015-06-01 MED ORDER — TRAZODONE HCL 100 MG PO TABS
100 MG | ORAL_TABLET | Freq: Every evening | ORAL | 1 refills | Status: DC
Start: 2015-06-01 — End: 2015-12-02

## 2015-06-01 MED ORDER — DULOXETINE HCL 30 MG PO CPEP
30 MG | ORAL_CAPSULE | Freq: Every day | ORAL | 1 refills | Status: DC
Start: 2015-06-01 — End: 2015-12-02
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
## 2015-06-14 DIAGNOSIS — M8700 Idiopathic aseptic necrosis of unspecified bone: Secondary | ICD-10-CM | POA: Insufficient documentation

## 2015-07-12 ENCOUNTER — Encounter: Attending: Colon & Rectal Surgery | Primary: Family Medicine

## 2015-07-18 LAB — CBC WITH AUTO DIFFERENTIAL
Absolute Baso #: 0 10*3/uL (ref 0.0–0.2)
Absolute Eos #: 0 10*3/uL (ref 0.0–0.5)
Absolute Lymph #: 1.4 10*3/uL (ref 1.0–4.3)
Absolute Mono #: 0.5 10*3/uL (ref 0.0–0.8)
Absolute Neut #: 1.2 10*3/uL — ABNORMAL LOW (ref 1.8–7.0)
Basophils: 0.4 %
Eosinophils: 1.2 %
Granulocytes %: 37.8 %
Hematocrit: 30.5 % — ABNORMAL LOW (ref 35.0–47.0)
Hemoglobin: 10 g/dL — ABNORMAL LOW (ref 11.7–16.0)
Lymphocyte %: 44.6 %
MCH: 31.2 pg (ref 26.0–34.0)
MCHC: 32.9 % (ref 32.0–36.0)
MCV: 95 fL (ref 79.0–98.0)
MPV: 9.5 fL (ref 7.4–10.4)
Monocytes: 16 %
Platelets: 212 10*3/uL (ref 140–440)
RBC: 3.21 10*6/uL — ABNORMAL LOW (ref 3.80–5.20)
RDW: 13.8 % (ref 11.5–14.5)
WBC: 3.2 10*3/uL — ABNORMAL LOW (ref 3.6–10.7)

## 2015-07-18 LAB — COMPREHENSIVE METABOLIC PANEL
ALT: 29 U/L (ref 12–78)
AST: 19 U/L (ref 15–37)
Albumin,Serum: 2.8 g/dL — ABNORMAL LOW (ref 3.4–5.0)
Alkaline Phosphatase: 149 U/L — ABNORMAL HIGH (ref 45–117)
Anion Gap: 9 NA
BUN: 24 mg/dL (ref 7–25)
CO2: 25 mmol/L (ref 21–32)
Calcium: 7.9 mg/dL — ABNORMAL LOW (ref 8.2–10.1)
Chloride: 107 mmol/L (ref 98–109)
Creatinine: 1.02 mg/dL (ref 0.55–1.40)
EGFR IF NonAfrican American: 56.4 mL/min (ref >60)
Glucose: 100 mg/dL (ref 70–100)
Potassium: 3.7 mmol/L (ref 3.5–5.1)
Sodium: 141 mmol/L (ref 135–145)
Total Bilirubin: 0.2 mg/dL (ref 0.2–1.0)
Total Protein: 6.5 g/dL (ref 6.4–8.2)
eGFR African American: >60 mL/min (ref >60)

## 2015-07-18 LAB — MAGNESIUM: Magnesium: 2.2 mg/dL (ref 1.8–2.4)

## 2015-07-18 LAB — PHOSPHORUS: Phosphorus: 3.1 mg/dL (ref 2.5–4.9)

## 2015-07-21 ENCOUNTER — Ambulatory Visit
Admit: 2015-07-21 | Discharge: 2015-07-21 | Payer: MEDICARE | Attending: Colon & Rectal Surgery | Primary: Family Medicine

## 2015-07-21 DIAGNOSIS — K50811 Crohn's disease of both small and large intestine with rectal bleeding: Secondary | ICD-10-CM

## 2015-07-21 NOTE — Progress Notes (Signed)
Assessment:     1. Crohn's disease of both small and large intestine with rectal bleeding ([name])     2. SGS (short gut syndrome)                 Plan:     1.  Patient's history and records are reviewed.  2.  Patient's upcoming surgical plans are reviewed as well.  3.  No further workup necessary at present for my standpoint.  4.  Office visit in 3 months.       Objective   History:     Angel Stevens is a 54 y.o. female who presents today for ongoing follow-up for Crohn's disease and short gut syndrome.  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  She is doing well.  She has no chest pain or shortness of breath.  Her wound is healing well.  Her abdominal status is baseline.  She has had a recent hospital admission for line sepsis.  She is up and about at present and doing better than usual.  She is to be reevaluated in 2-3 months for consideration for addition to the intestinal transplant list.  She is seen in the office today with Leighton Ruff CMA.         Physical Exam:   BP 116/80 (Site: Left Arm, Position: Sitting, Cuff Size: Large Adult)  Pulse 60  Temp 98.1 ??F (36.7 ??C)  Ht 5\' 9"  (1.753 m)  Wt 175 lb 3.2 oz (79.5 kg)  BMI 25.87 kg/m2    Physical Exam   Constitutional: She is oriented to person, place, and time. She appears well-developed and well-nourished. No distress.   HENT:   Head: Normocephalic and atraumatic.   Eyes: Conjunctivae are normal. Pupils are equal, round, and reactive to light. No scleral icterus.   Neck: Normal range of motion. Neck supple. No JVD present. No tracheal deviation present. No thyromegaly present.   Cardiovascular: Normal rate, regular rhythm, normal heart sounds and intact distal pulses.  Exam reveals no gallop and no friction rub.    No murmur heard.  Pulmonary/Chest: Effort normal and breath sounds normal. No stridor. No respiratory distress. She has no wheezes. She has no rales. She exhibits no  tenderness.   Abdominal: Soft. Bowel sounds are normal. She exhibits no distension and no mass. There is tenderness. There is no rebound and no guarding.   Musculoskeletal: Normal range of motion. She exhibits no edema or tenderness.   Lymphadenopathy:     She has no cervical adenopathy.   Neurological: She is alert and oriented to person, place, and time. Coordination normal.   Skin: Skin is warm and dry. No rash noted. She is not diaphoretic. No erythema. No pallor.   Psychiatric: She has a normal mood and affect. Her behavior is normal. Judgment and thought content normal.         ROS: Review of Systems as recorded by the medical assistant has been reviewed by me, and I agree except as noted in the HPI.    Past Medical History   Diagnosis Date   ??? Chronic abdominal pain    ??? Crohn disease ([name])    ??? Depression    ??? Hypertension    ??? Osteoarthritis    ??? Short gut syndrome        Past Surgical History   Procedure Laterality Date   ??? Colonoscopy     ???  Hysterectomy     ??? Knee surgery     ??? Abdomen surgery     ??? Lung surgery       mass removed from right side       Current Outpatient Prescriptions   Medication Sig Dispense Refill   ??? ferric carboxymaltose (INJECTAFER) 750 MG/15ML SOLN IV solution Infuse 750 mg intravenously once     ??? Probiotic Product (PROBIOTIC DAILY PO) Take by mouth     ??? CefTAZidime 2 G SOLR Infuse intravenously     ??? oxyCODONE 5 MG capsule Take 5 mg by mouth every 4 hours as needed for Pain Indications: 2 every 4 hrs for post pain     ??? traZODone (DESYREL) 100 MG tablet Take 2 tablets by mouth nightly 180 tablet 1   ??? DULoxetine (CYMBALTA) 30 MG capsule Take 2 capsules by mouth daily 180 capsule 1   ??? buPROPion (WELLBUTRIN SR) 100 MG SR tablet Take 1 tablet by mouth 2 times daily 180 tablet 1   ??? cycloSPORINE (RESTASIS) 0.05 % ophthalmic emulsion Use 1 Drop in both eyes twice daily.     ??? dexlansoprazole (DEXILANT) 60 MG CPDR capsule Take by mouth     ??? sodium chloride 0.9 % solution  Indications: .45% currently      ??? estradiol (ESTRACE) 1 MG tablet   5   ??? Zoledronic Acid (RECLAST IV) Infuse intravenously Indications: Pt undure of dosage     ??? Diphenoxylate-Atropine (LOMOTIL PO) Take by mouth     ??? Cholecalciferol (VITAMIN D-3 PO) Take 400 Units by mouth     ??? Cyanocobalamin (VITAMIN B-12) 1000 MCG SUBL Place 1 tablet under the tongue     ??? POTASSIUM CHLORIDE PO Take 10 mEq by mouth     ??? dicyclomine (BENTYL) 10 MG capsule Take 20 mg by mouth every 8 hours     ??? hydrocortisone (PROCTOSOL HC) 2.5 % rectal cream Place rectally     ??? metoprolol (LOPRESSOR) 100 MG tablet Take 100 mg by mouth     ??? mometasone (NASONEX) 50 MCG/ACT nasal spray one daily     ??? promethazine (PHENERGAN) 25 MG tablet Take 25 mg by mouth     ??? sucralfate (CARAFATE) 1 GM/10ML suspension Take 1 g by mouth     ??? TPN ADULT Infuse intravenously 10 hr bag once a day     ??? vitamin D (CHOLECALCIFEROL) 400 UNITS TABS tablet Take 400 Units by mouth daily     ??? amylase-lipase-protease (CREON) 24000 UNITS per capsule Take 1 capsule by mouth       No current facility-administered medications for this visit.        Social History     Social History   ??? Marital status: Single     Spouse name: N/A   ??? Number of children: N/A   ??? Years of education: N/A     Occupational History   ??? Not on file.     Social History Main Topics   ??? Smoking status: Former Smoker   ??? Smokeless tobacco: Not on file      Comment: Pt quit smoking 5-10 yrs ago   ??? Alcohol use Yes      Comment: occasional   ??? Drug use: No   ??? Sexual activity: Not on file     Other Topics Concern   ??? Not on file     Social History Narrative       Family History  Problem Relation Age of Onset   ??? High Blood Pressure Father        Allergies:  Allergies   Allergen Reactions   ??? Fentanyl    ??? Gabapentin Other (See Comments)     Spasms, adema   ??? Levsin [Hyoscyamine Sulfate] Other (See Comments)     Disorientation   ??? Lyrica [Pregabalin]    ??? Morphine    ??? Other      All elixirs: stomach  upset:   ??? Topamax [Topiramate]        PCP: Gust RungAnita K Amlani    Electronically signed by Lindell NoeMichael Tamasha Laplante, MD on 07/21/2015 at 6:48 PM.

## 2015-07-21 NOTE — Progress Notes (Signed)
Review of Systems   Constitutional: Positive for appetite change, chills, diaphoresis (night sweats severe) and fever. Negative for activity change, fatigue and unexpected weight change.   Respiratory: Positive for shortness of breath. Negative for cough, chest tightness and wheezing.    Cardiovascular: Negative for chest pain, palpitations and leg swelling.   Gastrointestinal: Positive for abdominal pain, diarrhea, nausea and rectal pain. Negative for blood in stool, constipation and vomiting.   Genitourinary: Negative for difficulty urinating and dysuria.   Musculoskeletal: Positive for arthralgias (right knee). Negative for joint swelling and myalgias.   Skin: Negative for rash and wound.   Allergic/Immunologic: Positive for immunocompromised state (crohns disease). Negative for environmental allergies and food allergies.   Neurological: Positive for dizziness, weakness and light-headedness.   Hematological: Negative for adenopathy. Does not bruise/bleed easily.   Psychiatric/Behavioral: Negative for self-injury and suicidal ideas.

## 2015-08-23 LAB — COMPREHENSIVE METABOLIC PANEL
ALT: 123 U/L — ABNORMAL HIGH (ref 12–78)
AST: 72 U/L — ABNORMAL HIGH (ref 15–37)
Albumin,Serum: 3.7 g/dL (ref 3.4–5.0)
Alkaline Phosphatase: 172 U/L — ABNORMAL HIGH (ref 45–117)
Anion Gap: 10 NA
BUN: 28 mg/dL — ABNORMAL HIGH (ref 7–25)
CO2: 24 mmol/L (ref 21–32)
Calcium: 8.9 mg/dL (ref 8.2–10.1)
Chloride: 109 mmol/L (ref 98–109)
Creatinine: 0.97 mg/dL (ref 0.55–1.40)
EGFR IF NonAfrican American: 59.8 mL/min (ref >60)
Glucose: 97 mg/dL (ref 70–100)
Potassium: 4.3 mmol/L (ref 3.5–5.1)
Sodium: 143 mmol/L (ref 135–145)
Total Bilirubin: 0.4 mg/dL (ref 0.2–1.0)
Total Protein: 7.5 g/dL (ref 6.4–8.2)
eGFR African American: >60 mL/min (ref >60)

## 2015-08-23 LAB — CBC
Hematocrit: 37.1 % (ref 35.0–47.0)
Hemoglobin: 12.3 g/dL (ref 11.7–16.0)
MCH: 31.2 pg (ref 26.0–34.0)
MCHC: 33.1 % (ref 32.0–36.0)
MCV: 94.1 fL (ref 79.0–98.0)
MPV: 9.7 fL (ref 7.4–10.4)
Platelets: 243 10*3/uL (ref 140–440)
RBC: 3.94 10*6/uL (ref 3.80–5.20)
RDW: 13.2 % (ref 11.5–14.5)
WBC: 5.9 10*3/uL (ref 3.6–10.7)

## 2015-08-23 LAB — PHOSPHORUS: Phosphorus: 3.3 mg/dL (ref 2.5–4.9)

## 2015-08-23 LAB — MAGNESIUM: Magnesium: 2.1 mg/dL (ref 1.8–2.4)

## 2015-09-28 ENCOUNTER — Ambulatory Visit
Admit: 2015-09-28 | Discharge: 2015-09-28 | Payer: MEDICARE | Attending: Colon & Rectal Surgery | Primary: Family Medicine

## 2015-09-28 DIAGNOSIS — K50811 Crohn's disease of both small and large intestine with rectal bleeding: Secondary | ICD-10-CM

## 2015-09-28 NOTE — Progress Notes (Signed)
Review of Systems   Constitutional: Negative for activity change, appetite change, chills, fatigue, fever and unexpected weight change.   Respiratory: Negative for cough, chest tightness, shortness of breath and wheezing.    Cardiovascular: Negative for chest pain, palpitations and leg swelling.   Gastrointestinal: Positive for abdominal pain and diarrhea. Negative for blood in stool, constipation, nausea, rectal pain and vomiting.   Genitourinary: Negative for difficulty urinating and dysuria.   Musculoskeletal: Positive for arthralgias and joint swelling. Negative for myalgias.   Skin: Negative for rash and wound.   Allergic/Immunologic: Positive for immunocompromised state (crohns). Negative for environmental allergies and food allergies.   Neurological: Positive for weakness. Negative for dizziness and light-headedness.   Hematological: Negative for adenopathy. Does not bruise/bleed easily.   Psychiatric/Behavioral: Negative for self-injury and suicidal ideas.

## 2015-09-28 NOTE — Progress Notes (Signed)
Assessment:     1. Crohn's disease of both small and large intestine with rectal bleeding ([name])     2. SGS (short gut syndrome)                 Plan:     1.  [name] recent hospitalization and ongoing transplant workup are discussed as well as issues involved with her Crohn's disease.  The office visit was primarily counseling in nature.  Over 51% of the visit was spent in counseling.  The office visit was 30 minutes in duration.  2.  Education and instructions are given, and all questions are answered.  The patient shows good understanding.  3.  Office follow-up in 3 months.       Objective   History:     Angel Stevens is a 54 y.o. female who presents today for ongoing monitoring of Crohn's disease as well as counseling regarding upcoming intestinal transplantation.  The patient has [facility] been [facility].  She had another line infection.  This time the line had to be relocated from her left subclavian area to her right subclavian.  She does not report any arm swelling, fever, or wound drainage.  Her gastrointestinal symptoms are baseline.  She has recovered nicely from her excision of her benign right pleural lesion.  She has a cutaneous lesion in her upper midline abdomen which she reports is uncomfortable for her, but no lesion is noted on examination.         Physical Exam:   BP 130/80  Pulse 76  Temp 98.4 ??F (36.9 ??C)  Ht  (1.753 m)  Wt 181 lb 6.4 oz (82.3 kg)  BMI 26.79 kg/m2    Physical Exam   Constitutional: She is oriented to person, place, and time. She appears well-developed. No distress.   HENT:   Head: Normocephalic and atraumatic.   Eyes: Conjunctivae are normal. Pupils are equal, round, and reactive to light. No scleral icterus.   Pulmonary/Chest: Effort normal. No respiratory distress.   Abdominal: Soft. Bowel sounds are normal. She exhibits no distension and no mass. There is tenderness. There is no rebound and no guarding.   Abdominal wall tissues  and skin benign.  No lesion noted.   Musculoskeletal: Normal range of motion. She exhibits no edema or tenderness.   Neurological: She is alert and oriented to person, place, and time. Coordination normal.   Skin: Skin is warm and dry. No rash noted. She is not diaphoretic. No erythema. No pallor.   Psychiatric: She has a normal mood and affect. Her behavior is normal. Judgment and thought content normal.         ROS: Review of Systems as recorded by the medical assistant has been reviewed by me, and I agree except as noted in the HPI.    Past Medical History   Diagnosis Date   ??? Chronic abdominal pain    ??? Crohn disease ([name])    ??? Depression    ??? Hypertension    ??? Osteoarthritis    ??? Short gut syndrome        Past Surgical History   Procedure Laterality Date   ??? Colonoscopy     ??? Hysterectomy     ??? Knee surgery     ??? Abdomen surgery     ??? Lung surgery       mass removed from right side       Current Outpatient Prescriptions  Medication Sig Dispense Refill   ??? ferric carboxymaltose (INJECTAFER) 750 MG/15ML SOLN IV solution Infuse 750 mg intravenously once     ??? Probiotic Product (PROBIOTIC DAILY PO) Take by mouth     ??? CefTAZidime 2 G SOLR Infuse intravenously     ??? oxyCODONE 5 MG capsule Take 5 mg by mouth every 4 hours as needed for Pain Indications: 2 every 4 hrs for post pain     ??? traZODone (DESYREL) 100 MG tablet Take 2 tablets by mouth nightly 180 tablet 1   ??? DULoxetine (CYMBALTA) 30 MG capsule Take 2 capsules by mouth daily 180 capsule 1   ??? buPROPion (WELLBUTRIN SR) 100 MG SR tablet Take 1 tablet by mouth 2 times daily 180 tablet 1   ??? cycloSPORINE (RESTASIS) 0.05 % ophthalmic emulsion Use 1 Drop in both eyes twice daily.     ??? dexlansoprazole (DEXILANT) 60 MG CPDR capsule Take by mouth     ??? sodium chloride 0.9 % solution Indications: .45% currently      ??? estradiol (ESTRACE) 1 MG tablet   5   ??? Zoledronic Acid (RECLAST IV) Infuse intravenously Indications: Pt undure of dosage     ???  Diphenoxylate-Atropine (LOMOTIL PO) Take by mouth     ??? Cholecalciferol (VITAMIN D-3 PO) Take 400 Units by mouth     ??? Cyanocobalamin (VITAMIN B-12) 1000 MCG SUBL Place 1 tablet under the tongue     ??? POTASSIUM CHLORIDE PO Take 10 mEq by mouth     ??? dicyclomine (BENTYL) 10 MG capsule Take 20 mg by mouth every 8 hours     ??? hydrocortisone (PROCTOSOL HC) 2.5 % rectal cream Place rectally     ??? metoprolol (LOPRESSOR) 100 MG tablet Take 100 mg by mouth     ??? mometasone (NASONEX) 50 MCG/ACT nasal spray one daily     ??? promethazine (PHENERGAN) 25 MG tablet Take 25 mg by mouth     ??? sucralfate (CARAFATE) 1 GM/10ML suspension Take 1 g by mouth     ??? amylase-lipase-protease (CREON) 24000 UNITS per capsule Take 1 capsule by mouth     ??? TPN ADULT Infuse intravenously 10 hr bag once a day     ??? vitamin D (CHOLECALCIFEROL) 400 UNITS TABS tablet Take 400 Units by mouth daily       No current facility-administered medications for this visit.        Social History     Social History   ??? Marital status: Single     Spouse name: N/A   ??? Number of children: N/A   ??? Years of education: N/A     Occupational History   ??? Not on file.     Social History Main Topics   ??? Smoking status: Former Smoker   ??? Smokeless tobacco: Not on file      Comment: Pt quit smoking 5-10 yrs ago   ??? Alcohol use Yes      Comment: occasional   ??? Drug use: No   ??? Sexual activity: Not on file     Other Topics Concern   ??? Not on file     Social History Narrative       Family History   Problem Relation Age of Onset   ??? High Blood Pressure Father        Allergies:  Allergies   Allergen Reactions   ??? Fentanyl    ??? Gabapentin Other (See Comments)     Spasms, adema   ???  Levsin [Hyoscyamine Sulfate] Other (See Comments)     Disorientation   ??? Lyrica [Pregabalin]    ??? Morphine    ??? Other      All elixirs: stomach upset:   ??? Topamax [Topiramate]        PCP: Gust Rung    Electronically signed by Lindell Noe, MD on 09/28/2015 at 10:02 AM.

## 2015-12-02 ENCOUNTER — Ambulatory Visit: Admit: 2015-12-02 | Discharge: 2015-12-02 | Payer: MEDICARE | Attending: Registered Nurse | Primary: Family Medicine

## 2015-12-02 DIAGNOSIS — F331 Major depressive disorder, recurrent, moderate: Secondary | ICD-10-CM

## 2015-12-02 MED ORDER — BUPROPION HCL ER (SR) 100 MG PO TB12
100 MG | ORAL_TABLET | Freq: Two times a day (BID) | ORAL | 1 refills | Status: DC
Start: 2015-12-02 — End: 2016-05-23

## 2015-12-02 MED ORDER — TRAZODONE HCL 100 MG PO TABS
100 MG | ORAL_TABLET | Freq: Every evening | ORAL | 1 refills | Status: DC
Start: 2015-12-02 — End: 2016-05-23

## 2015-12-02 MED ORDER — DULOXETINE HCL 30 MG PO CPEP
30 MG | ORAL_CAPSULE | Freq: Every day | ORAL | 1 refills | Status: DC
Start: 2015-12-02 — End: 2016-05-23

## 2015-12-02 NOTE — Progress Notes (Signed)
Subjective:        Chief Complaints:      Follow up          HPI:   Visit Duration: 11:05/11:35/30 min  Interim History: Pt present for f/u appt.  Had lung surgery last yr(Aug)to remove mass.  Pt states it was bigger than they thought- only complication was the development of stridor and ended up back on ICU.  Pt has had  have many line infections- and was hospitalized for the infections. Pt states they are running out of sites to start an IV.  Stayed at daughters for Christmas as Dec was the only month she wasn't in the hospital.  Talked in length about I/O's, tracking everthing, getting enough fluids.  States she still needs a transplant of small bowel but cannot have the surgery yet because she doesn't have a 3 m caregiver set up.   Pt has still been helping with grandkids- this wears her out. Overall daughters stable.  Grand kids; Vonna Kotyk is doing better.Dema Severin (56yr) tried to put a pancake with syrup into his brother's butt... "Fascinated with butts".  108yr Marlene Bast- has had problems with potty training. Older sisters continue to be estranged, pt states she is done trying and wants to write them a letter informing them of that.   Worry about twin sister([name]) she has MS and is in an ECF.  Niece just found out her father is not her biological father, niece Clair Gulling was understandably upset but working through it.     Sleep and appetite good-is trying to lose weight.    Reports she has been getting out, going to daughters.  Trying to stay active.  Some depression noted- misses her mom, sister's condition- lives at [facility].   Offered much support and encouragement.  Denies difficulties with meds.  Will f/u in approx 6 mo. Denies SI HI     Target Signs/Symptoms: anxiety, depression            Gyn History: n/a          Medications:   Current Outpatient Prescriptions   Medication Sig Dispense Refill   ??? ferric carboxymaltose (INJECTAFER) 750 MG/15ML SOLN IV solution Infuse 750 mg intravenously once     ??? Probiotic  Product (PROBIOTIC DAILY PO) Take by mouth     ??? CefTAZidime 2 G SOLR Infuse intravenously     ??? oxyCODONE 5 MG capsule Take 5 mg by mouth every 4 hours as needed for Pain Indications: 2 every 4 hrs for post pain     ??? traZODone (DESYREL) 100 MG tablet Take 2 tablets by mouth nightly 180 tablet 1   ??? DULoxetine (CYMBALTA) 30 MG capsule Take 2 capsules by mouth daily 180 capsule 1   ??? buPROPion (WELLBUTRIN SR) 100 MG SR tablet Take 1 tablet by mouth 2 times daily 180 tablet 1   ??? cycloSPORINE (RESTASIS) 0.05 % ophthalmic emulsion Use 1 Drop in both eyes twice daily.     ??? dexlansoprazole (DEXILANT) 60 MG CPDR capsule Take by mouth     ??? sodium chloride 0.9 % solution Indications: .45% currently      ??? estradiol (ESTRACE) 1 MG tablet   5   ??? Zoledronic Acid (RECLAST IV) Infuse intravenously Indications: Pt undure of dosage     ??? Diphenoxylate-Atropine (LOMOTIL PO) Take by mouth     ??? Cholecalciferol (VITAMIN D-3 PO) Take 400 Units by mouth     ??? Cyanocobalamin (VITAMIN B-12) 1000 MCG  SUBL Place 1 tablet under the tongue     ??? amylase-lipase-protease (CREON) 24000 UNITS per capsule Take 1 capsule by mouth     ??? POTASSIUM CHLORIDE PO Take 10 mEq by mouth     ??? dicyclomine (BENTYL) 10 MG capsule Take 20 mg by mouth every 8 hours     ??? hydrocortisone (PROCTOSOL HC) 2.5 % rectal cream Place rectally     ??? metoprolol (LOPRESSOR) 100 MG tablet Take 100 mg by mouth     ??? mometasone (NASONEX) 50 MCG/ACT nasal spray one daily     ??? promethazine (PHENERGAN) 25 MG tablet Take 25 mg by mouth     ??? sucralfate (CARAFATE) 1 GM/10ML suspension Take 1 g by mouth     ??? TPN ADULT Infuse intravenously 10 hr bag once a day     ??? vitamin D (CHOLECALCIFEROL) 400 UNITS TABS tablet Take 400 Units by mouth daily       No current facility-administered medications for this visit.              Allergies:   Allergies   Allergen Reactions   ??? Fentanyl    ??? Gabapentin Other (See Comments)     Spasms, adema   ??? Levsin [Hyoscyamine Sulfate] Other (See  Comments)     Disorientation   ??? Lyrica [Pregabalin]    ??? Morphine    ??? Other      All elixirs: stomach upset:   ??? Topamax [Topiramate]             Objective:        Examination:   Mental Status Exam:   GENERAL OBSERVATIONS :.   Appearance appropriately dressed and healthy looking   Demeanor  cooperative  Activity normal.   Eye Contact good    SPEECH   Rate normal  Volume  appropriate  Articulation clear.   Coherent yes.   Spontaneous no.   MOOD & AFFECT :.   Depression  moderate  Anxiety moderate  Anger mild  Anhedonia  mild  Euphoria denies  Irritability mild  Affect appropriate  Associations logical.   Process Goal-Directed  THOUGHT CONTENT :.   Hallucinations No  Delusions. No  Suicidality no plan  Homicidality no plan   Judgement fair   Insight Good  Orientation time yes person yes situation yes place yes.   Attention fair   Concentration fair   Memory Recent: intact Remote: intact.   Language Naming: intact Repetition: intact.   Condition:  not changed  Prognosis:  fair           Examination:   Mental Status Exam:                ASSESSMENT/PLAN:    [name] was seen today for 6 month follow-up.    Diagnoses and all orders for this visit:    Major depressive disorder, recurrent episode, moderate ([name])      FOLLOW-UP:  6 mo

## 2015-12-29 ENCOUNTER — Encounter: Attending: Colon & Rectal Surgery | Primary: Family Medicine

## 2016-01-05 ENCOUNTER — Ambulatory Visit
Admit: 2016-01-05 | Discharge: 2016-01-05 | Payer: MEDICARE | Attending: Colon & Rectal Surgery | Primary: Family Medicine

## 2016-01-05 DIAGNOSIS — K50811 Crohn's disease of both small and large intestine with rectal bleeding: Secondary | ICD-10-CM

## 2016-01-05 NOTE — Progress Notes (Signed)
Assessment:     1. Crohn's disease of both small and large intestine with rectal bleeding ([name])     2. SGS (short gut syndrome)                 Plan:     1.  Continue follow-up through [facility] intestinal transplantation team and nutritional support through them as well  2.  Office follow-up in 3 months.       Objective   History:     Angel Stevens is a 55 y.o. female who presents today for ongoing follow-up of Crohn's disease with short gut syndrome.  The patient has been preparing for intestinal transplantation through the [facility] clinic, but is having difficulty lining up an outpatient caregiver.  She continues to have abdominal pain with diarrhea.  She is getting good nutritional support through the short gut team.  She has not had any recent febrile illnesses and has not had have a line change for some time.  She still has occasional Emergency Room visits and hospitalizations.  She currently denies fever chills sweats or systemic symptoms.         Physical Exam:   BP 130/80 (Site: Right Arm, Position: Sitting, Cuff Size: Small Adult)  Pulse 80  Temp 98.4 ??F (36.9 ??C)  Ht 5\' 9"  (1.753 m)  Wt 184 lb 11.2 oz (83.8 kg)  BMI 27.28 kg/m2    Physical Exam   Constitutional: She is oriented to person, place, and time. No distress.   HENT:   Head: Normocephalic and atraumatic.   Eyes: Conjunctivae are normal. Pupils are equal, round, and reactive to light. No scleral icterus.   Neck: Normal range of motion. Neck supple. No JVD present. No tracheal deviation present. No thyromegaly present.   Cardiovascular: Normal rate, regular rhythm, normal heart sounds and intact distal pulses.  Exam reveals no gallop and no friction rub.    No murmur heard.  Pulmonary/Chest: Effort normal and breath sounds normal. No stridor. No respiratory distress. She has no wheezes. She has no rales. She exhibits no tenderness.   Abdominal: Soft. Bowel sounds are normal. She exhibits no distension and no mass. There is tenderness.  There is no rebound and no guarding.   Musculoskeletal: Normal range of motion. She exhibits no edema or tenderness.   Lymphadenopathy:     She has no cervical adenopathy.   Neurological: She is alert and oriented to person, place, and time. Coordination normal.   Skin: Skin is warm and dry. No rash noted. She is not diaphoretic. No erythema. No pallor.   Psychiatric: She has a normal mood and affect. Her behavior is normal. Judgment and thought content normal.         ROS: Review of Systems as recorded by the medical assistant has been reviewed by me, and I agree except as noted in the HPI.    Past Medical History   Diagnosis Date   ??? Chronic abdominal pain    ??? Crohn disease ([name])    ??? Depression    ??? Hypertension    ??? Osteoarthritis    ??? Short gut syndrome    ??? Ventral hernia        Past Surgical History   Procedure Laterality Date   ??? Colonoscopy     ??? Hysterectomy     ??? Knee surgery     ??? Abdomen surgery     ??? Lung surgery       mass removed from  right side       Current Outpatient Prescriptions   Medication Sig Dispense Refill   ??? diclofenac sodium (VOLTAREN) 1 % GEL Apply 4 g topically     ??? diphenoxylate-atropine (LOMOTIL) 2.5-0.025 MG per tablet Take 3 tablets by mouth daily      ??? oxyCODONE (ROXICODONE) 5 MG immediate release tablet 3 times daily  .     ??? loperamide (IMODIUM) 2 MG capsule      ??? estradiol (ESTRACE) 2 MG tablet      ??? buPROPion (WELLBUTRIN SR) 100 MG extended release tablet Take 1 tablet by mouth 2 times daily 180 tablet 1   ??? DULoxetine (CYMBALTA) 30 MG extended release capsule Take 2 capsules by mouth daily 180 capsule 1   ??? traZODone (DESYREL) 100 MG tablet Take 2 tablets by mouth nightly 180 tablet 1   ??? ferric carboxymaltose (INJECTAFER) 750 MG/15ML SOLN IV solution Infuse 750 mg intravenously once Indications: every 3 months      ??? Probiotic Product (PROBIOTIC DAILY PO) Take by mouth     ??? CefTAZidime 2 G SOLR Infuse intravenously     ??? cycloSPORINE (RESTASIS) 0.05 % ophthalmic  emulsion Use 1 Drop in both eyes twice daily.     ??? sodium chloride 0.9 % solution Indications: .45% currently      ??? Zoledronic Acid (RECLAST IV) Infuse intravenously Indications: Pt undure of dosage     ??? Cholecalciferol (VITAMIN D-3 PO) Take 400 Units by mouth     ??? Cyanocobalamin (VITAMIN B-12) 1000 MCG SUBL Place 1 tablet under the tongue     ??? POTASSIUM CHLORIDE PO Take 10 mEq by mouth     ??? dicyclomine (BENTYL) 10 MG capsule Take 20 mg by mouth every 8 hours     ??? hydrocortisone (PROCTOSOL HC) 2.5 % rectal cream Place rectally     ??? metoprolol (LOPRESSOR) 100 MG tablet Take 100 mg by mouth     ??? mometasone (NASONEX) 50 MCG/ACT nasal spray one daily     ??? promethazine (PHENERGAN) 25 MG tablet Take 25 mg by mouth     ??? sucralfate (CARAFATE) 1 GM/10ML suspension Take 1 g by mouth     ??? TPN ADULT Infuse intravenously 10 hr bag once a day     ??? vitamin D (CHOLECALCIFEROL) 400 UNITS TABS tablet Take 400 Units by mouth daily     ??? oxyCODONE 5 MG capsule Take 5 mg by mouth every 4 hours as needed for Pain Indications: 2 every 4 hrs for post pain     ??? dexlansoprazole (DEXILANT) 60 MG CPDR capsule Take by mouth     ??? estradiol (ESTRACE) 1 MG tablet   5   ??? Diphenoxylate-Atropine (LOMOTIL PO) Take by mouth     ??? amylase-lipase-protease (CREON) 24000 UNITS per capsule Take 1 capsule by mouth       No current facility-administered medications for this visit.        Social History     Social History   ??? Marital status: Single     Spouse name: N/A   ??? Number of children: N/A   ??? Years of education: N/A     Occupational History   ??? Not on file.     Social History Main Topics   ??? Smoking status: Former Smoker   ??? Smokeless tobacco: Not on file      Comment: Pt quit smoking 5-10 yrs ago   ??? Alcohol use Yes  Comment: occasional   ??? Drug use: No   ??? Sexual activity: Not on file     Other Topics Concern   ??? Not on file     Social History Narrative       Family History   Problem Relation Age of Onset   ??? High Blood Pressure  Father        Allergies:  Allergies   Allergen Reactions   ??? Fentanyl    ??? Gabapentin Other (See Comments)     Spasms, adema   ??? Levsin [Hyoscyamine Sulfate] Other (See Comments)     Disorientation   ??? Lyrica [Pregabalin]    ??? Morphine    ??? Other      All elixirs: stomach upset:   ??? Topamax [Topiramate]        PCP: Gust Rung    Electronically signed by Lindell Noe, MD on 01/05/2016 at 2:23 PM.

## 2016-01-05 NOTE — Progress Notes (Signed)
Review of Systems   Constitutional: Positive for fatigue. Negative for activity change, appetite change, chills, fever and unexpected weight change.   Respiratory: Negative for cough, chest tightness, shortness of breath and wheezing.    Cardiovascular: Negative for chest pain, palpitations and leg swelling.   Gastrointestinal: Positive for abdominal pain, diarrhea, nausea and rectal pain. Negative for blood in stool, constipation and vomiting.   Genitourinary: Negative for difficulty urinating and dysuria.   Musculoskeletal: Positive for arthralgias and myalgias. Negative for joint swelling.   Skin: Negative for rash and wound.   Allergic/Immunologic: Negative for environmental allergies, food allergies and immunocompromised state.   Neurological: Positive for light-headedness. Negative for dizziness and weakness.   Hematological: Negative for adenopathy. Does not bruise/bleed easily.   Psychiatric/Behavioral: Negative for self-injury and suicidal ideas.

## 2016-03-20 NOTE — Other (Unsigned)
Patient Acct Nbr: [id number]   Primary AUTH/CERT:   Primary Insurance Company Name: [employer]  Primary Insurance Plan name: Medicare A  Primary Insurance Group Number:   Primary Insurance Plan Type: [employer]  Primary Insurance Policy Number: [id number]    Secondary AUTH/CERT:   Secondary Insurance Company Name: [employer]  Secondary Insurance Plan name: Medicare B  Secondary Insurance Group Number:   Secondary Insurance Plan Type: [employer] B  Secondary Insurance Policy Number: [id number]    Tertiary AUTH/CERT:   [employer]iary Insurance Company Name: [employer]  Tertiary Insurance Plan name: [employer]  Tertiary Insurance Group Number:   [employer]iary Insurance Plan Type: [employer]  Tertiary Insurance Policy Number: [id number]

## 2016-04-03 ENCOUNTER — Ambulatory Visit
Admit: 2016-04-03 | Discharge: 2016-04-03 | Payer: MEDICARE | Attending: Colon & Rectal Surgery | Primary: Family Medicine

## 2016-04-03 DIAGNOSIS — K912 Postsurgical malabsorption, not elsewhere classified: Secondary | ICD-10-CM

## 2016-04-03 NOTE — Progress Notes (Signed)
Review of Systems   Constitutional: Positive for appetite change. Negative for activity change, chills, fatigue, fever and unexpected weight change.   Respiratory: Negative for cough, chest tightness, shortness of breath and wheezing.    Cardiovascular: Negative for chest pain, palpitations and leg swelling.   Gastrointestinal: Positive for abdominal pain, diarrhea and nausea. Negative for blood in stool, constipation, rectal pain and vomiting.   Genitourinary: Negative for difficulty urinating and dysuria.   Musculoskeletal: Positive for arthralgias, back pain, joint swelling and myalgias.   Skin: Negative for rash and wound.   Allergic/Immunologic: Negative for environmental allergies, food allergies and immunocompromised state.   Neurological: Positive for weakness. Negative for dizziness and light-headedness.   Hematological: Negative for adenopathy. Bruises/bleeds easily.   Psychiatric/Behavioral: Negative for self-injury and suicidal ideas.

## 2016-04-03 NOTE — Progress Notes (Signed)
Assessment:     1. SGS (short gut syndrome)     2. Crohn's disease of both small and large intestine with rectal bleeding ([name])                 Plan:     1.  Ongoing multidisciplinary team follow up both from the standpoint of short gut syndrome and Crohn's disease.  2.  Social barriers to proceeding with small bowel transplantation.  The patient is working on this.  3.  Reinstitution of transplant candidacy process once social barriers overcome.  4.  Office visit in three months.       Objective   History:     Angel Stevens is a 55 y.o. female who presents today for ongoing follow up from the standpoint of Crohn's and short gut syndrome.  [facility]  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  She needs to find a home situation with 24 hour support for a significant period of time before she can be added to the small bowel transplant queue.  She currently is in a stable medical situation.         Physical Exam:   BP 124/80 (Site: Right Arm, Position: Sitting, Cuff Size: Small Adult)   Pulse 60   Temp 97.9 ??F (36.6 ??C)   Ht 5\' 9"  (1.753 m)   Wt 176 lb 12.8 oz (80.2 kg)   BMI 26.11 kg/m2    Physical Exam   Constitutional: She is oriented to person, place, and time. She appears well-developed and well-nourished.   HENT:   Head: Normocephalic and atraumatic.   Neck: Normal range of motion. Neck supple.   Abdominal: Soft. She exhibits no distension. There is no tenderness.   Musculoskeletal: Normal range of motion.   Neurological: She is alert and oriented to person, place, and time.   Skin: Skin is warm and dry. No rash noted.   Psychiatric: She has a normal mood and affect. Her behavior is normal. Judgment and thought content normal.   Nursing note and vitals reviewed.        ROS: Review of Systems as recorded by the medical assistant has been reviewed by me, and I agree except as noted in the HPI.    Past Medical History:    Diagnosis Date   ??? Chronic abdominal pain    ??? Crohn disease ([name])    ??? Depression    ??? Hypertension    ??? Osteoarthritis    ??? Short gut syndrome    ??? Ventral hernia        Past Surgical History:   Procedure Laterality Date   ??? ABDOMEN SURGERY     ??? COLONOSCOPY     ??? HYSTERECTOMY     ??? KNEE SURGERY     ??? LUNG SURGERY      mass removed from right side       Current Outpatient Prescriptions   Medication Sig Dispense Refill   ??? diclofenac sodium (VOLTAREN) 1 % GEL Apply 4 g topically     ??? oxyCODONE (ROXICODONE) 5 MG immediate release tablet 3 times daily  .     ??? loperamide (IMODIUM) 2 MG capsule      ??? estradiol (ESTRACE) 2 MG tablet      ??? buPROPion (WELLBUTRIN SR) 100 MG extended release tablet Take 1 tablet by mouth 2 times daily 180 tablet 1   ??? DULoxetine (  CYMBALTA) 30 MG extended release capsule Take 2 capsules by mouth daily 180 capsule 1   ??? traZODone (DESYREL) 100 MG tablet Take 2 tablets by mouth nightly 180 tablet 1   ??? ferric carboxymaltose (INJECTAFER) 750 MG/15ML SOLN IV solution Infuse 750 mg intravenously once Indications: every 3 months      ??? Probiotic Product (PROBIOTIC DAILY PO) Take by mouth     ??? CefTAZidime 2 G SOLR Infuse intravenously     ??? cycloSPORINE (RESTASIS) 0.05 % ophthalmic emulsion Use 1 Drop in both eyes twice daily.     ??? dexlansoprazole (DEXILANT) 60 MG CPDR capsule Take by mouth     ??? sodium chloride 0.9 % solution Indications: .45% currently      ??? Zoledronic Acid (RECLAST IV) Infuse intravenously Indications: Pt undure of dosage     ??? Diphenoxylate-Atropine (LOMOTIL PO) Take by mouth     ??? Cholecalciferol (VITAMIN D-3 PO) Take 4,000 Units by mouth      ??? Cyanocobalamin (VITAMIN B-12) 1000 MCG SUBL Place 1 tablet under the tongue     ??? dicyclomine (BENTYL) 10 MG capsule Take 20 mg by mouth every 8 hours     ??? hydrocortisone (PROCTOSOL HC) 2.5 % rectal cream Place rectally     ??? metoprolol (LOPRESSOR) 100 MG tablet Take 100 mg by mouth     ??? mometasone (NASONEX) 50 MCG/ACT nasal  spray one daily     ??? promethazine (PHENERGAN) 25 MG tablet Take 25 mg by mouth     ??? sucralfate (CARAFATE) 1 GM/10ML suspension Take 1 g by mouth     ??? TPN ADULT Infuse intravenously 10 hr bag once a day     ??? oxyCODONE 5 MG capsule Take 5 mg by mouth every 4 hours as needed for Pain Indications: 2 every 4 hrs for post pain     ??? estradiol (ESTRACE) 1 MG tablet   5   ??? amylase-lipase-protease (CREON) 24000 UNITS per capsule Take 1 capsule by mouth     ??? POTASSIUM CHLORIDE PO Take 10 mEq by mouth     ??? vitamin D (CHOLECALCIFEROL) 400 UNITS TABS tablet Take 400 Units by mouth daily       No current facility-administered medications for this visit.        Social History     Social History   ??? Marital status: Single     Spouse name: N/A   ??? Number of children: N/A   ??? Years of education: N/A     Occupational History   ??? Not on file.     Social History Main Topics   ??? Smoking status: Former Smoker   ??? Smokeless tobacco: Not on file      Comment: Pt quit smoking 5-10 yrs ago   ??? Alcohol use Yes      Comment: occasional   ??? Drug use: No   ??? Sexual activity: Not on file     Other Topics Concern   ??? Not on file     Social History Narrative       Family History   Problem Relation Age of Onset   ??? High Blood Pressure Father        Allergies:  Allergies   Allergen Reactions   ??? Fentanyl    ??? Gabapentin Other (See Comments)     Spasms, adema   ??? Levsin [Hyoscyamine Sulfate] Other (See Comments)     Disorientation   ??? Lyrica [Pregabalin]    ???  Morphine    ??? Other      All elixirs: stomach upset:   ??? Topamax [Topiramate]        PCP: Gust Rung, MD    Electronically signed by Lindell Noe, MD on 04/03/2016 at 7:00 PM.

## 2016-05-23 ENCOUNTER — Ambulatory Visit: Admit: 2016-05-23 | Discharge: 2016-05-23 | Payer: MEDICARE | Attending: Registered Nurse | Primary: Family Medicine

## 2016-05-23 DIAGNOSIS — F331 Major depressive disorder, recurrent, moderate: Secondary | ICD-10-CM

## 2016-05-23 MED ORDER — BUPROPION HCL ER (SR) 100 MG PO TB12
100 MG | ORAL_TABLET | Freq: Two times a day (BID) | ORAL | 1 refills | Status: DC
Start: 2016-05-23 — End: 2016-12-11

## 2016-05-23 MED ORDER — DULOXETINE HCL 30 MG PO CPEP
30 MG | ORAL_CAPSULE | Freq: Every day | ORAL | 1 refills | Status: DC
Start: 2016-05-23 — End: 2016-12-11

## 2016-05-23 MED ORDER — TRAZODONE HCL 100 MG PO TABS
100 MG | ORAL_TABLET | Freq: Every evening | ORAL | 1 refills | Status: DC
Start: 2016-05-23 — End: 2016-12-11

## 2016-05-23 NOTE — Progress Notes (Signed)
Subjective:        Chief Complaints:      6 mFollow up          HPI:   Visit Duration: 1:30/2:00/30 min  Interim History: Pt present for f/u appt.  Pt states she has been in the hospital every month with line infections in her gut.  Last adm 5/18-6/4 for yeast infection in her line and in her blood.  Pt has had  have many line infections- and has been  hospitalized for the infections. Pt states they are running out of sites to start an IV.  Had to stop the TPN as it a risk factor for further infection. Talked in length about I/O's, tracking everything, getting enough fluids.  States she still needs a transplant of small bowel but cannot have the surgery yet because she doesn't have a 3 m caregiver set up.   Pt has still been helping with grand kids- this wears her out. Overall daughters stable.  Grand kids; Angel Stevens is doing better- graduated from 5th grade- pt was able to attend, was not in the hospital.   Older sisters continue to be estranged, pt states she is done trying and wants to write them a letter informing them of that.   Worry about twin sister(Angel Stevens) she has MS and is in an ECF- she is palliative- prognosis poor.  Niece takes care of her mother Angel Stevens- as she is working 2 jobs and taking care of her son as well.   Sleep and appetite good-is trying to lose weight- lost approx 10+ lbs.    Offered much support and encouragement.  Denies difficulties with meds.  Will f/u in approx 6 mo. Denies SI HI     Target Signs/Symptoms: anxiety, depression            Gyn History: n/a          Medications:   Current Outpatient Prescriptions   Medication Sig Dispense Refill   ??? traZODone (DESYREL) 100 MG tablet Take 2 tablets by mouth nightly 180 tablet 1   ??? DULoxetine (CYMBALTA) 30 MG extended release capsule Take 2 capsules by mouth daily 180 capsule 1   ??? buPROPion (WELLBUTRIN SR) 100 MG extended release tablet Take 1 tablet by mouth 2 times daily 180 tablet 1   ??? diclofenac sodium (VOLTAREN) 1 % GEL Apply 4 g  topically     ??? oxyCODONE (ROXICODONE) 5 MG immediate release tablet 3 times daily  .     ??? loperamide (IMODIUM) 2 MG capsule      ??? estradiol (ESTRACE) 2 MG tablet      ??? ferric carboxymaltose (INJECTAFER) 750 MG/15ML SOLN IV solution Infuse 750 mg intravenously once Indications: every 3 months      ??? Probiotic Product (PROBIOTIC DAILY PO) Take by mouth     ??? CefTAZidime 2 G SOLR Infuse intravenously     ??? oxyCODONE 5 MG capsule Take 5 mg by mouth every 4 hours as needed for Pain Indications: 2 every 4 hrs for post pain     ??? cycloSPORINE (RESTASIS) 0.05 % ophthalmic emulsion Use 1 Drop in both eyes twice daily.     ??? dexlansoprazole (DEXILANT) 60 MG CPDR capsule Take by mouth     ??? sodium chloride 0.9 % solution Indications: .45% currently      ??? estradiol (ESTRACE) 1 MG tablet   5   ??? Zoledronic Acid (RECLAST IV) Infuse intravenously Indications: Pt undure of dosage     ???  Diphenoxylate-Atropine (LOMOTIL PO) Take by mouth     ??? Cholecalciferol (VITAMIN D-3 PO) Take 4,000 Units by mouth      ??? Cyanocobalamin (VITAMIN B-12) 1000 MCG SUBL Place 1 tablet under the tongue     ??? amylase-lipase-protease (CREON) 24000 UNITS per capsule Take 1 capsule by mouth     ??? POTASSIUM CHLORIDE PO Take 10 mEq by mouth     ??? dicyclomine (BENTYL) 10 MG capsule Take 20 mg by mouth every 8 hours     ??? hydrocortisone (PROCTOSOL HC) 2.5 % rectal cream Place rectally     ??? metoprolol (LOPRESSOR) 100 MG tablet Take 100 mg by mouth     ??? mometasone (NASONEX) 50 MCG/ACT nasal spray one daily     ??? promethazine (PHENERGAN) 25 MG tablet Take 25 mg by mouth     ??? sucralfate (CARAFATE) 1 GM/10ML suspension Take 1 g by mouth     ??? TPN ADULT Infuse intravenously 10 hr bag once a day     ??? vitamin D (CHOLECALCIFEROL) 400 UNITS TABS tablet Take 400 Units by mouth daily       No current facility-administered medications for this visit.              Allergies:   Allergies   Allergen Reactions   ??? Fentanyl    ??? Gabapentin Other (See Comments)      Spasms, adema   ??? Levsin [Hyoscyamine Sulfate] Other (See Comments)     Disorientation   ??? Lyrica [Pregabalin]    ??? Morphine    ??? Other      All elixirs: stomach upset:   ??? Topamax [Topiramate]             Objective:        Examination:   Mental Status Exam:   GENERAL OBSERVATIONS :.   Appearance appropriately dressed and healthy looking   Demeanor  cooperative  Activity normal.   Eye Contact good    SPEECH   Rate normal  Volume  appropriate  Articulation clear.   Coherent yes.   Spontaneous no.   MOOD & AFFECT :.   Depression  moderate  Anxiety moderate  Anger mild  Anhedonia  mild  Euphoria denies  Irritability mild  Affect appropriate  Associations logical.   Process Goal-Directed  THOUGHT CONTENT :.   Hallucinations No  Delusions. No  Suicidality no plan  Homicidality no plan   Judgement fair   Insight Good  Orientation time yes person yes situation yes place yes.   Attention fair   Concentration fair   Memory Recent: intact Remote: intact.   Language Naming: intact Repetition: intact.   Condition:  not changed  Prognosis:  fair           Examination:   Mental Status Exam:                ASSESSMENT/PLAN:    Angel Stevens was seen today for follow-up.    Diagnoses and all orders for this visit:    Major depressive disorder, recurrent episode, moderate ([name])  -     traZODone (DESYREL) 100 MG tablet; Take 2 tablets by mouth nightly  -     DULoxetine (CYMBALTA) 30 MG extended release capsule; Take 2 capsules by mouth daily  -     buPROPion (WELLBUTRIN SR) 100 MG extended release tablet; Take 1 tablet by mouth 2 times daily      FOLLOW-UP:  6 mo

## 2016-06-01 ENCOUNTER — Encounter: Attending: Registered Nurse | Primary: Family Medicine

## 2016-06-08 NOTE — Other (Unsigned)
Patient Acct Nbr: [id number]   Primary AUTH/CERT:   Primary Insurance Company Name: [employer]  Primary Insurance Plan name: Medicare A  Primary Insurance Group Number:   Primary Insurance Plan Type: [employer] A  Primary Insurance Policy Number: [id number] A    Secondary AUTH/CERT:   Secondary Insurance Company Name: [employer]  Secondary Insurance Plan name: Medicare B  Secondary Insurance Group Number:   Secondary Insurance Plan Type: [employer] B  Secondary Insurance Policy Number: [id number]    Tertiary AUTH/CERT:   UAL Corporationertiary Insurance Company Name: [employer]  Tertiary Insurance Plan name: [employer]  Tertiary Insurance Group Number:   [employer]iary Insurance Plan Type: [facility]  Tertiary Insurance Policy Number: [id number]

## 2016-06-09 LAB — COMPREHENSIVE METABOLIC PANEL
ALT: 96 U/L — ABNORMAL HIGH (ref 12–78)
AST: 65 U/L — ABNORMAL HIGH (ref 15–37)
Albumin,Serum: 3.5 g/dL (ref 3.4–5.0)
Alkaline Phosphatase: 156 U/L — ABNORMAL HIGH (ref 45–117)
Anion Gap: 9 NA
BUN: 25 mg/dL (ref 7–25)
CO2: 25 mmol/L (ref 21–32)
Calcium: 8.6 mg/dL (ref 8.2–10.1)
Chloride: 109 mmol/L (ref 98–109)
Creatinine: 1.21 mg/dL (ref 0.55–1.40)
EGFR IF NonAfrican American: 46.2 mL/min (ref >60)
Glucose: 80 mg/dL (ref 70–100)
Potassium: 5.1 mmol/L (ref 3.5–5.1)
Sodium: 143 mmol/L (ref 135–145)
Total Bilirubin: 0.5 mg/dL (ref 0.2–1.0)
Total Protein: 7.2 g/dL (ref 6.4–8.2)
eGFR African American: 55.9 mL/min (ref >60)

## 2016-06-09 LAB — CBC
Hematocrit: 28.9 % — ABNORMAL LOW (ref 35.0–47.0)
Hemoglobin: 9.6 g/dL — ABNORMAL LOW (ref 11.7–16.0)
MCH: 30.9 pg (ref 26.0–34.0)
MCHC: 33.1 % (ref 32.0–36.0)
MCV: 93.3 fL (ref 79.0–98.0)
MPV: 9.1 fL (ref 7.4–10.4)
Platelets: 217 10*3/uL (ref 140–440)
RBC: 3.1 10*6/uL — ABNORMAL LOW (ref 3.80–5.20)
RDW: 14.9 % — ABNORMAL HIGH (ref 11.5–14.5)
WBC: 4.6 10*3/uL (ref 3.6–10.7)

## 2016-06-09 LAB — PHOSPHORUS: Phosphorus: 3.3 mg/dL (ref 2.5–4.9)

## 2016-06-09 LAB — MAGNESIUM: Magnesium: 1.9 mg/dL (ref 1.8–2.4)

## 2016-06-12 NOTE — Other (Unsigned)
Patient Acct Nbr: [id number]   Primary AUTH/CERT:   Primary Insurance Company Name: [employer]  Primary Insurance Plan name: Medicare A  Primary Insurance Group Number:   Primary Insurance Plan Type: [employer] A  Primary Insurance Policy Number: [id number] A    Secondary AUTH/CERT:   Secondary Insurance Company Name: [employer]  Secondary Insurance Plan name: Medicare B  Secondary Insurance Group Number:   Secondary Insurance Plan Type: [employer] B  Secondary Insurance Policy Number: [id number]    Tertiary AUTH/CERT:   UAL [employer]ertiary Insurance Company Name: [employer]  Tertiary Insurance Plan name: [employer]  Tertiary Insurance Group Number:   [employer]iary Insurance Plan Type: [facility]  Tertiary Insurance Policy Number: [id number]

## 2016-06-18 ENCOUNTER — Inpatient Hospital Stay: Attending: Gastroenterology | Primary: Family Medicine

## 2016-06-18 NOTE — Other (Unsigned)
Patient Acct Nbr: [id number]   Primary AUTH/CERT:   Primary Insurance Company Name: [employer]  Primary Insurance Plan name: Medicare A  Primary Insurance Group Number:   Primary Insurance Plan Type: [place] A  Primary Insurance Policy Number: [id number] A    Secondary AUTH/CERT:   Secondary Insurance Company Name: [employer]  Secondary Insurance Plan name: Medicare B  Secondary Insurance Group Number:   Secondary Insurance Plan Type: [employer] Insurance Policy Number: [id number] A    Tertiary AUTH/CERT:   [employer] Corporation Name: [employer] Plan name: [employer] Group Number:   [employer] Plan Type: [facility] Number: [id number]

## 2016-06-19 LAB — VANCOMYCIN LEVEL, TROUGH: Vancomycin Tr: 22.6 ug/mL — ABNORMAL HIGH (ref 15.0–20.0)

## 2016-06-22 NOTE — Other (Unsigned)
Patient Acct Nbr: [id number]   Primary AUTH/CERT:   Primary Insurance Company Name: [employer]  Primary Insurance Plan name: Medicare A  Primary Insurance Group Number:   Primary Insurance Plan Type: [place] A  Primary Insurance Policy Number: [id number] A    Secondary AUTH/CERT:   Secondary Insurance Company Name: [employer]  Secondary Insurance Plan name: Medicare B  Secondary Insurance Group Number:   Secondary Insurance Plan Type: [employer] Insurance Policy Number: [id number] A    Tertiary AUTH/CERT:   UAL Corporation Name: [employer] Plan name: [employer] Group Number:   [employer] Plan Type: [facility]Point Number: [id number]

## 2016-06-23 LAB — VANCOMYCIN LEVEL, TROUGH: Vancomycin Tr: 10.3 ug/mL — ABNORMAL LOW (ref 15.0–20.0)

## 2016-06-25 NOTE — Other (Unsigned)
Patient Acct Nbr: [id number]   Primary AUTH/CERT:   Primary Insurance Company Name: [employer]  Primary Insurance Plan name: Medicare A  Primary Insurance Group Number:   Primary Insurance Plan Type: [place] A  Primary Insurance Policy Number: [id number] A    Secondary AUTH/CERT:   Secondary Insurance Company Name: [employer]  Secondary Insurance Plan name: Medicare B  Secondary Insurance Group Number:   Secondary Insurance Plan Type: [employer] Insurance Policy Number: [id number] A    Tertiary AUTH/CERT:   UAL Corporation Name: [employer] Plan name: [employer] Group Number:   [employer] Plan Type: WellPoint Number: [id number]

## 2016-06-26 LAB — CBC WITH AUTO DIFFERENTIAL
Absolute Baso #: 0 10*3/uL (ref 0.0–0.2)
Absolute Eos #: 0.1 10*3/uL (ref 0.0–0.5)
Absolute Lymph #: 2.1 10*3/uL (ref 1.0–4.3)
Absolute Mono #: 0.3 10*3/uL (ref 0.0–0.8)
Absolute Neut #: 1.8 10*3/uL (ref 1.8–7.0)
Basophils: 0.4 %
Eosinophils: 1.5 %
Granulocytes %: 41.7 %
Hematocrit: 29.2 % — ABNORMAL LOW (ref 35.0–47.0)
Hemoglobin: 9.8 g/dL — ABNORMAL LOW (ref 11.7–16.0)
Lymphocyte %: 49.7 %
MCH: 30.9 pg (ref 26.0–34.0)
MCHC: 33.5 % (ref 32.0–36.0)
MCV: 92.3 fL (ref 79.0–98.0)
MPV: 9.4 fL (ref 7.4–10.4)
Monocytes: 6.7 %
Platelets: 206 10*3/uL (ref 140–440)
RBC: 3.17 10*6/uL — ABNORMAL LOW (ref 3.80–5.20)
RDW: 14.4 % (ref 11.5–14.5)
WBC: 4.2 10*3/uL (ref 3.6–10.7)

## 2016-06-26 LAB — VANCOMYCIN LEVEL, TROUGH: Vancomycin Tr: 10.7 ug/mL — ABNORMAL LOW (ref 15.0–20.0)

## 2016-06-26 LAB — HEPATIC FUNCTION PANEL
ALT: 69 U/L (ref 12–78)
AST: 24 U/L (ref 15–37)
Albumin,Serum: 3.1 g/dL — ABNORMAL LOW (ref 3.4–5.0)
Alkaline Phosphatase: 107 U/L (ref 45–117)
Bilirubin, Direct: 0.2 mg/dL (ref 0.0–0.2)
Total Bilirubin: 0.5 mg/dL (ref 0.2–1.0)
Total Protein: 6.8 g/dL (ref 6.4–8.2)

## 2016-07-05 ENCOUNTER — Ambulatory Visit
Admit: 2016-07-05 | Discharge: 2016-07-05 | Payer: MEDICARE | Attending: Colon & Rectal Surgery | Primary: Family Medicine

## 2016-07-05 DIAGNOSIS — K50811 Crohn's disease of both small and large intestine with rectal bleeding: Secondary | ICD-10-CM

## 2016-07-05 NOTE — Progress Notes (Signed)
Review of Systems   Constitutional: Positive for fatigue. Negative for activity change, appetite change, chills, fever and unexpected weight change.   Respiratory: Negative for cough, chest tightness, shortness of breath and wheezing.    Cardiovascular: Negative for chest pain, palpitations and leg swelling.   Gastrointestinal: Positive for abdominal pain, diarrhea, nausea and rectal pain. Negative for blood in stool, constipation and vomiting.   Genitourinary: Negative for difficulty urinating and dysuria.   Musculoskeletal: Positive for arthralgias and myalgias.   Skin: Negative for rash and wound.   Allergic/Immunologic: Positive for immunocompromised state (crohns). Negative for environmental allergies and food allergies.   Neurological: Negative for dizziness, weakness and light-headedness.   Hematological: Negative for adenopathy. Does not bruise/bleed easily.   Psychiatric/Behavioral: Negative for self-injury and suicidal ideas.

## 2016-07-05 NOTE — Progress Notes (Signed)
Assessment:     1. Crohn's disease of both small and large intestine with rectal bleeding ([name])     2. SGS (short gut syndrome)                 Plan:     1.  No indication for further surgical workup or procedures present.  2.  Ongoing follow-up through gastroenterology and nutrition team.  3.  Office visit in 3 months.       Objective   History:     Angel Stevens is a 55 y.o. female who presents today for Crohn's disease follow-up.  She has had several hospitalizations both here and in [place] for line infections.  At one point she had Candida sepsis.  She has also had a endoscopically demonstrated recurrence of her Crohn's disease at the anastomosis.  At this point in time, small bowel transplant considerations are on hold.  The patient continues on home TPN.  She is in reasonably good spirits despite the numerous recent setbacks.  She currently denies fever chills or systemic symptoms.  She is back to her baseline from a gastrointestinal standpoint.  Gastroenterology is going to try 6-mercaptopurine.  She is currently on steroids.         Physical Exam:   BP 130/80   Pulse 78   Temp 98 ??F (36.7 ??C)   Ht 5\' 9"  (1.753 m)   Wt 176 lb 9.6 oz (80.1 kg)   BMI 26.08 kg/m2    Physical Exam   Constitutional: She is oriented to person, place, and time. Vital signs are normal. She appears well-developed.  Non-toxic appearance. She does not have a sickly appearance. She does not appear ill. No distress.   HENT:   Head: Normocephalic and atraumatic.   Nose: Nose normal.   Eyes: Conjunctivae and EOM are normal. Pupils are equal, round, and reactive to light. Right eye exhibits no discharge. Left eye exhibits no discharge. No scleral icterus.   Neck: Normal range of motion. Neck supple. No JVD present. No tracheal deviation present. No thyromegaly present.   Central line site intact.   Cardiovascular: Normal rate, regular rhythm, S1 normal, S2 normal, normal heart sounds and intact distal pulses.  Exam reveals no gallop  and no friction rub.    No murmur heard.  Pulmonary/Chest: Effort normal. No accessory muscle usage or stridor. No respiratory distress. She has wheezes. She has no rales. She exhibits no tenderness.   Abdominal: Soft. Bowel sounds are normal. She exhibits no distension and no mass. There is no hepatosplenomegaly. There is tenderness. There is no rebound, no guarding and no CVA tenderness. No hernia.   Musculoskeletal: She exhibits no edema or tenderness.   Lymphadenopathy:     She has no cervical adenopathy.   Neurological: She is alert and oriented to person, place, and time. Coordination normal.   Skin: Skin is warm and dry. No rash noted. She is not diaphoretic. No erythema. No pallor.   Psychiatric: She has a normal mood and affect. Her behavior is normal. Judgment and thought content normal.         ROS: Review of Systems as recorded by the medical assistant has been reviewed by me, and I agree except as noted in the HPI.    Past Medical History:   Diagnosis Date   ??? Chronic abdominal pain    ??? Crohn disease ([name])    ??? Depression    ??? Hypertension    ??? Osteoarthritis    ???  Short gut syndrome    ??? Ventral hernia        Past Surgical History:   Procedure Laterality Date   ??? ABDOMEN SURGERY     ??? COLONOSCOPY     ??? HYSTERECTOMY     ??? KNEE SURGERY     ??? LUNG SURGERY      mass removed from right side       Current Outpatient Prescriptions   Medication Sig Dispense Refill   ??? diclofenac (FLECTOR) 1.3 % patch Place 1 patch onto the skin     ??? PREDNISONE PO Take 30 mg by mouth     ??? traZODone (DESYREL) 100 MG tablet Take 2 tablets by mouth nightly 180 tablet 1   ??? DULoxetine (CYMBALTA) 30 MG extended release capsule Take 2 capsules by mouth daily 180 capsule 1   ??? buPROPion (WELLBUTRIN SR) 100 MG extended release tablet Take 1 tablet by mouth 2 times daily 180 tablet 1   ??? diclofenac sodium (VOLTAREN) 1 % GEL Apply 4 g topically     ??? loperamide (IMODIUM) 2 MG capsule      ??? estradiol (ESTRACE) 2 MG tablet      ???  Probiotic Product (PROBIOTIC DAILY PO) Take by mouth     ??? cycloSPORINE (RESTASIS) 0.05 % ophthalmic emulsion Use 1 Drop in both eyes twice daily.     ??? dexlansoprazole (DEXILANT) 60 MG CPDR capsule Take by mouth     ??? sodium chloride 0.9 % solution Indications: .45% currently      ??? estradiol (ESTRACE) 1 MG tablet   5   ??? Zoledronic Acid (RECLAST IV) Infuse intravenously Indications: Pt undure of dosage     ??? Diphenoxylate-Atropine (LOMOTIL PO) Take by mouth     ??? Cholecalciferol (VITAMIN D-3 PO) Take 4,000 Units by mouth      ??? Cyanocobalamin (VITAMIN B-12) 1000 MCG SUBL Place 1 tablet under the tongue     ??? dicyclomine (BENTYL) 10 MG capsule Take 20 mg by mouth every 8 hours     ??? hydrocortisone (PROCTOSOL HC) 2.5 % rectal cream Place rectally     ??? metoprolol (LOPRESSOR) 100 MG tablet Take 100 mg by mouth     ??? mometasone (NASONEX) 50 MCG/ACT nasal spray one daily     ??? promethazine (PHENERGAN) 25 MG tablet Take 25 mg by mouth     ??? sucralfate (CARAFATE) 1 GM/10ML suspension Take 1 g by mouth     ??? TPN ADULT Infuse intravenously 10 hr bag once a day     ??? vitamin D (CHOLECALCIFEROL) 400 UNITS TABS tablet Take 400 Units by mouth daily     ??? oxyCODONE (ROXICODONE) 5 MG immediate release tablet 3 times daily  .     ??? ferric carboxymaltose (INJECTAFER) 750 MG/15ML SOLN IV solution Infuse 750 mg intravenously once Indications: every 3 months      ??? CefTAZidime 2 G SOLR Infuse intravenously     ??? oxyCODONE 5 MG capsule Take 5 mg by mouth every 4 hours as needed for Pain Indications: 2 every 4 hrs for post pain     ??? amylase-lipase-protease (CREON) 24000 UNITS per capsule Take 1 capsule by mouth     ??? POTASSIUM CHLORIDE PO Take 10 mEq by mouth       No current facility-administered medications for this visit.        Social History     Social History   ??? Marital status: Single  Spouse name: N/A   ??? Number of children: N/A   ??? Years of education: N/A     Occupational History   ??? Not on file.     Social History Main  Topics   ??? Smoking status: Former Smoker   ??? Smokeless tobacco: Not on file      Comment: Pt quit smoking 5-10 yrs ago   ??? Alcohol use No      Comment: occasional   ??? Drug use: No   ??? Sexual activity: Not on file     Other Topics Concern   ??? Not on file     Social History Narrative       Family History   Problem Relation Age of Onset   ??? High Blood Pressure Father        Allergies:  Allergies   Allergen Reactions   ??? Fentanyl    ??? Gabapentin Other (See Comments)     Spasms, adema   ??? Levsin [Hyoscyamine Sulfate] Other (See Comments)     Disorientation   ??? Lyrica [Pregabalin]    ??? Morphine    ??? Other      All elixirs: stomach upset:   ??? Topamax [Topiramate]        PCP: Gust Rung, MD    Electronically signed by Lindell Noe, MD on 07/05/2016 at 7:46 PM.

## 2016-10-08 ENCOUNTER — Inpatient Hospital Stay: Admit: 2016-10-08 | Attending: Gastroenterology | Primary: Family Medicine

## 2016-10-08 MED ORDER — SODIUM CHLORIDE 0.9 % IV SOLN
0.9 % | INTRAVENOUS | Status: DC
Start: 2016-10-08 — End: 2016-10-09

## 2016-10-08 NOTE — Discharge Instructions (Signed)
???   Good nutrition is important when healing from an illness, injury, or surgery.  Follow any nutrition recommendations given to you during your hospital stay.   ??? If you were given an oral nutrition supplement while in the hospital, continue to take this supplement at home.  You can take it with meals, in-between meals, and/or before bedtime. These supplements can be purchased at most local grocery stores, pharmacies, and chain super-stores.   ??? If you have any questions about your diet or nutrition, call the hospital and ask for the dietitian.      Light diet today, resume normal diet tomorrow.

## 2016-10-08 NOTE — Other (Unsigned)
Patient Acct Nbr: [id number]   Primary AUTH/CERT:   Primary Insurance Company Name: [employer]  Primary Insurance Plan name: Medicare A  Primary Insurance Group Number:   Primary Insurance Plan Type: [employer] A  Primary Insurance Policy Number: [id number] A    Secondary AUTH/CERT:   Secondary Insurance Company Name: [employer]  Secondary Insurance Plan name: Medicare B  Secondary Insurance Group Number:   Secondary Insurance Plan Type: [employer] B  Secondary Insurance Policy Number: [id number]    Tertiary AUTH/CERT:   UAL [employer]ertiary Insurance Company Name: [employer]  Tertiary Insurance Plan name: [employer]  Tertiary Insurance Group Number:   [employer]iary Insurance Plan Type: [facility]  Tertiary Insurance Policy Number: [id number]

## 2016-10-08 NOTE — Discharge Instructions (Signed)
Colonoscopy: What to expect at home    ACTIVITY:  DO NOT DRIVE, OPERATE MACHINERY, OR DRINK ANY ALCOHOL TODAY.     Avoid making critical decisions, signing legal documents, or performing any activity that requires alertness for the rest of the day.     You may be bloated or have gas pains since air was introduced into the colon for the procedure. You may need to pass the gas throughout the day.     You may experience a small amount of rectal bleeding; this can be normal after your colonoscopy. Notify your physician if the bleeding is enough to saturate your clothes.  Rest the remainder of the day.    You may resume normal activity tomorrow.     You may return to work tomorrow.    DIET:  You may resume a normal diet unless notified or recommended by your physician.    You may be eager to eat a large meal after fasting, but it is a good idea to start with light meals and ease into solid foods the first day. (*)    If your stomach is upset, try clear liquids and bland, low-fat foods like plain toast or rice.  Drink plenty of fluids for the first 24 hours (unless your physician states otherwise).    MEDICATION:  Resume your normal home medications unless notified or recommended by your physician.    If you take blood thinners (such as Coumadin, Eliquis, Plavix, Aspirin, etc.) or anti-inflammatory medications (Advil, Motrin, Aleve, etc.), ask your physician when you may resume these medications.    FOLLOW-UP APPOINTMENT:  Follow up with or call your physician as needed.    When to call for help:  Call your doctor IMMEDIATELY or seek medical care if you experience:  ??? Severe pain or vomiting  ??? A large amount (filling the toilet) of maroon, bloody stools or tar-like stools  ??? Your belly is swollen and firm with severe pain  ??? A  fever greater than 101 degrees  ??? Redness or swelling of arm from the IV site for more than 48 hours  ??? Sudden onset of chest pain or shortness of breath   ??? If you become extremely dizzy or pass  out (lose consciousness)   **IF YOU ARE UNABLE TO REACH YOUR PHYSICIAN GO TO NEAREST EMERGENCY DEPARTMENT**

## 2016-10-08 NOTE — Anesthesia Post-Procedure Evaluation (Signed)
Department of Anesthesiology                             Post Anesthesia Note    Name: Angel PouchMaria R Aeschliman MRN: [id number] DOB: 1961-08-30    (Age-55 y.o.)     This note was authored with review of the notes of the PACU and/or Same day stay and/or direct communication with the patient with an anesthesia care provider.     POST  ANESTHESIA  EVALUATION    Patient Vitals for the past 2 hrs:   BP Temp Temp src Pulse Resp SpO2 Height Weight   10/08/16 1343 (!) 169/97 97.5 ??F (36.4 ??C) Temporal 61 16 99 % 5\' 8"  (1.727 m) 175 lb (79.4 kg)        BP (!) 169/97    Pulse 61    Temp 97.5 ??F (36.4 ??C) (Temporal)    Resp 16    Ht 5\' 8"  (1.727 m)    Wt 175 lb (79.4 kg)    SpO2 99%    BMI 26.61 kg/m??      Pain Level: 8    Vital signs: Respiratory and Cardiovascular function within normal limits (See Nursing Record)  Level of consciousness: Patient awake/ Able to participate      Return to baseline mental status: Yes Airway status: Normal   Pain controlled: Yes Dental injury: No   Nausea/Vomiting controlled: Yes Complications: no   Well hydrated: Yes      Patient Experience comments: None expressed    Electronically signed by Dimple NanasShawn M Alayshia Marini, CRNA on 10/08/2016 at 2:13 PM

## 2016-10-08 NOTE — H&P (Signed)
Interval History and Physical    I have interviewed and examined the patient and reviewed the recent History and Physical.  There have been no interval  changes to the recent H&P documentation.    The patient understands the planned procedure and its associated risks and benefits and agrees to proceed.    The procedural consent form has been signed.      Impression: Crphns disease with diarrhea and pain, short gut syndrome     Plan: colonsocopy

## 2016-10-09 ENCOUNTER — Ambulatory Visit
Admit: 2016-10-09 | Discharge: 2016-10-09 | Payer: MEDICARE | Attending: Colon & Rectal Surgery | Primary: Family Medicine

## 2016-10-09 DIAGNOSIS — K50811 Crohn's disease of both small and large intestine with rectal bleeding: Secondary | ICD-10-CM

## 2016-10-09 NOTE — Progress Notes (Signed)
Review of Systems   Constitutional: Positive for activity change (Dowb/Back Pain). Negative for appetite change, chills, fatigue, fever and unexpected weight change.   Respiratory: Positive for cough. Negative for chest tightness, shortness of breath and wheezing.    Cardiovascular: Negative for chest pain, palpitations and leg swelling.   Gastrointestinal: Positive for abdominal pain, diarrhea and nausea. Negative for blood in stool, constipation, rectal pain and vomiting.   Genitourinary: Negative for difficulty urinating and dysuria.   Musculoskeletal: Positive for arthralgias and joint swelling. Negative for myalgias.   Skin: Negative for rash and wound.   Allergic/Immunologic: Positive for immunocompromised state (Crohns). Negative for environmental allergies and food allergies.   Neurological: Positive for weakness. Negative for dizziness and light-headedness.   Hematological: Negative for adenopathy. Does not bruise/bleed easily.   Psychiatric/Behavioral: Negative for self-injury and suicidal ideas.

## 2016-10-09 NOTE — Progress Notes (Signed)
Assessment:     1. Crohn's disease of both small and large intestine with rectal bleeding ([name])     2. SGS (short gut syndrome)                 Plan:     1.  No further workup indicated at present  2.  Ongoing antibiotics for T6-T7 discitis  3.  Office visit in 3 months       Objective   History:     Angel PouchMaria R Stevens is a 55 y.o. female who presents today for Follow-up.  The patient has a history of recurrent Crohn's disease and short gut syndrome.  She has had a recent colonoscopy and the patient states that her he anastomosis recurrence of the Crohn's disease has been noted.  She has had numerous admissions recently to [facility].  She has had line infections and a line replacement is soon to occur.  She also has T6-T7 discitis which is being treated with long-term antibiotics.  She is understandably depressed regarding her medical picture, and counseling is provided.  Small bowel transplant considerations are on hold at present.         Physical Exam:   BP (!) 142/80    Pulse 68    Temp 98 ??F (36.7 ??C)    Ht 5\' 8"  (1.727 m)    Wt 176 lb (79.8 kg)    BMI 26.76 kg/m??     Physical Exam   Constitutional: She is oriented to person, place, and time. She appears well-developed. No distress.   HENT:   Head: Normocephalic and atraumatic.   Eyes: Conjunctivae are normal. Pupils are equal, round, and reactive to light. No scleral icterus.   Neck: Normal range of motion. Neck supple. No JVD present. No tracheal deviation present. No thyromegaly present.   Cardiovascular: Normal rate, regular rhythm and normal heart sounds.    Pulmonary/Chest: Effort normal and breath sounds normal. No stridor. No respiratory distress. She has no wheezes. She has no rales.   Abdominal: Soft. Bowel sounds are normal. She exhibits no distension and no mass. There is tenderness. There is no rebound and no guarding.   Musculoskeletal: She exhibits no edema or deformity.   Neurological: She is alert and oriented to person, place,  and time.   Skin: She is not diaphoretic.   Psychiatric: She has a normal mood and affect. Her behavior is normal. Judgment and thought content normal.         ROS: Review of Systems as recorded by the medical assistant has been reviewed by me, and I agree except as noted in the HPI.    Past Medical History:   Diagnosis Date   ??? Chronic abdominal pain    ??? Crohn disease ([name])    ??? Depression    ??? Hypertension    ??? Osteoarthritis    ??? Osteoporosis    ??? Short gut syndrome    ??? Ventral hernia        Past Surgical History:   Procedure Laterality Date   ??? ABDOMEN SURGERY     ??? COLONOSCOPY  10/08/2016    biopsies taken   ??? HYSTERECTOMY     ??? KNEE SURGERY     ??? LUNG SURGERY      mass removed from right side       Current Outpatient Prescriptions   Medication Sig Dispense Refill   ??? amLODIPine (NORVASC) 2.5 MG tablet Take 2.5 mg by mouth     ???  diclofenac (FLECTOR) 1.3 % patch Place 1 patch onto the skin     ??? traZODone (DESYREL) 100 MG tablet Take 2 tablets by mouth nightly 180 tablet 1   ??? DULoxetine (CYMBALTA) 30 MG extended release capsule Take 2 capsules by mouth daily 180 capsule 1   ??? buPROPion (WELLBUTRIN SR) 100 MG extended release tablet Take 1 tablet by mouth 2 times daily 180 tablet 1   ??? estradiol (ESTRACE) 2 MG tablet      ??? Probiotic Product (PROBIOTIC DAILY PO) Take by mouth     ??? CefTAZidime 2 G SOLR Infuse intravenously     ??? cycloSPORINE (RESTASIS) 0.05 % ophthalmic emulsion Use 1 Drop in both eyes twice daily.     ??? dexlansoprazole (DEXILANT) 60 MG CPDR capsule Take by mouth     ??? sodium chloride 0.9 % solution Indications: .45% currently      ??? Zoledronic Acid (RECLAST IV) Infuse intravenously Indications: Pt undure of dosage     ??? Diphenoxylate-Atropine (LOMOTIL PO) Take by mouth     ??? Cholecalciferol (VITAMIN D-3 PO) Take 4,000 Units by mouth      ??? Cyanocobalamin (VITAMIN B-12) 1000 MCG SUBL Place 1 tablet under the tongue     ??? dicyclomine (BENTYL) 10 MG capsule Take 20 mg by mouth every 8 hours      ??? hydrocortisone (PROCTOSOL HC) 2.5 % rectal cream Place rectally     ??? metoprolol (LOPRESSOR) 100 MG tablet Take 100 mg by mouth     ??? mometasone (NASONEX) 50 MCG/ACT nasal spray one daily     ??? promethazine (PHENERGAN) 25 MG tablet Take 25 mg by mouth     ??? sucralfate (CARAFATE) 1 GM/10ML suspension Take 1 g by mouth     ??? TPN ADULT Infuse intravenously 10 hr bag once a day     ??? vitamin D (CHOLECALCIFEROL) 400 UNITS TABS tablet Take 400 Units by mouth daily     ??? PREDNISONE PO Take 30 mg by mouth     ??? diclofenac sodium (VOLTAREN) 1 % GEL Apply 4 g topically     ??? loperamide (IMODIUM) 2 MG capsule      ??? ferric carboxymaltose (INJECTAFER) 750 MG/15ML SOLN IV solution Infuse 750 mg intravenously once Indications: every 3 months      ??? amylase-lipase-protease (CREON) 24000 UNITS per capsule Take 1 capsule by mouth     ??? POTASSIUM CHLORIDE PO Take 10 mEq by mouth       No current facility-administered medications for this visit.        Social History     Social History   ??? Marital status: Single     Spouse name: N/A   ??? Number of children: N/A   ??? Years of education: N/A     Occupational History   ??? Not on file.     Social History Main Topics   ??? Smoking status: Former Smoker   ??? Smokeless tobacco: Never Used      Comment: Pt quit smoking 5-10 yrs ago   ??? Alcohol use No      Comment: occasional   ??? Drug use: No   ??? Sexual activity: Not on file     Other Topics Concern   ??? Not on file     Social History Narrative   ??? No narrative on file       Family History   Problem Relation Age of Onset   ??? High Blood Pressure  Father        Allergies:  Allergies   Allergen Reactions   ??? Fentanyl And Related Itching   ??? Gabapentin Other (See Comments)     Spasms, swelling legs   ??? Levsin [Hyoscyamine Sulfate] Other (See Comments)     Disorientation   ??? Lyrica [Pregabalin]      Cough/wheezing   ??? Morphine Itching   ??? Other      All elixirs: stomach upset:   ??? Topamax [Topiramate]      dehydration       PCP: Gust Rung,  MD    Electronically signed by Lindell Noe, MD on 10/09/2016 at 2:24 PM.

## 2016-11-22 ENCOUNTER — Encounter: Attending: Registered Nurse | Primary: Family Medicine

## 2016-12-01 DIAGNOSIS — E860 Dehydration: Secondary | ICD-10-CM | POA: Insufficient documentation

## 2016-12-02 DIAGNOSIS — D649 Anemia, unspecified: Secondary | ICD-10-CM | POA: Insufficient documentation

## 2016-12-02 DIAGNOSIS — R52 Pain, unspecified: Secondary | ICD-10-CM | POA: Insufficient documentation

## 2016-12-02 DIAGNOSIS — I10 Essential (primary) hypertension: Secondary | ICD-10-CM | POA: Insufficient documentation

## 2016-12-11 ENCOUNTER — Ambulatory Visit: Admit: 2016-12-11 | Discharge: 2016-12-11 | Payer: MEDICARE | Attending: Registered Nurse | Primary: Family Medicine

## 2016-12-11 DIAGNOSIS — F331 Major depressive disorder, recurrent, moderate: Secondary | ICD-10-CM

## 2016-12-11 MED ORDER — DULOXETINE HCL 30 MG PO CPEP
30 | ORAL_CAPSULE | Freq: Every day | ORAL | 1 refills | Status: DC
Start: 2016-12-11 — End: 2017-07-12

## 2016-12-11 MED ORDER — TRAZODONE HCL 100 MG PO TABS
100 | ORAL_TABLET | Freq: Every evening | ORAL | 1 refills | Status: DC
Start: 2016-12-11 — End: 2017-07-12

## 2016-12-11 MED ORDER — BUPROPION HCL ER (SR) 100 MG PO TB12
100 | ORAL_TABLET | Freq: Two times a day (BID) | ORAL | 1 refills | Status: DC
Start: 2016-12-11 — End: 2017-07-12

## 2016-12-11 NOTE — Progress Notes (Signed)
Subjective:        Chief Complaints:      6 m follow up          HPI:   Visit Duration: 11:00/11:30/30 min  Interim History: Pt present for 28mf/u appt., established with this provider in Feb 2014.  Pt continues to have have many medical problems.  Recent infection in her line, had to have a new line put in her leg for antibiotic therapy.  Pt states she stopped eating and is getting nourishment thru TPN.  Did well in the Fall- then had multiple admission starting in Oct.  Pt states they told her they don't know what to do with her.  They want her to go back to [facility]- pt does not want to because she states it doesn't help.  They have been trying many medications- as an alternate to a small bowel transplant. Pt met someone online- communicating online and enjoying it.  He is out of the country working as a cCivil engineer, contracting  States this has helped her mood and has added an interest to her life.  Pt has still been helping with grand kids- this wears her out. Overall daughters stable.  [place].  Twin sister [place]u Mon[place] MS and is in an ECF- she is palliative- prognosis poor.  Offered much support and encouragement.  Denies difficulties with meds. In spite of many medical problems, mood stable on current mental health meds.  Denies cardiac symptoms at present.   Will f/u in approx 6 mo. Denies SI HI     Target Signs/Symptoms: some anxiety, some depression...             Gyn History: n/a          Medications:   Current Outpatient Prescriptions   Medication Sig Dispense Refill   ??? amLODIPine (NORVASC) 2.5 MG tablet Take 2.5 mg by mouth     ??? diclofenac (FLECTOR) 1.3 % patch Place 1 patch onto the skin     ??? PREDNISONE PO Take 30 mg by mouth     ??? traZODone (DESYREL) 100 MG tablet Take 2 tablets by mouth nightly 180 tablet 1   ??? DULoxetine (CYMBALTA) 30 MG extended release capsule Take 2 capsules by mouth daily 180 capsule 1   ??? buPROPion (WELLBUTRIN SR) 100 MG extended release tablet  Take 1 tablet by mouth 2 times daily 180 tablet 1   ??? diclofenac sodium (VOLTAREN) 1 % GEL Apply 4 g topically     ??? loperamide (IMODIUM) 2 MG capsule      ??? estradiol (ESTRACE) 2 MG tablet      ??? ferric carboxymaltose (INJECTAFER) 750 MG/15ML SOLN IV solution Infuse 750 mg intravenously once Indications: every 3 months      ??? Probiotic Product (PROBIOTIC DAILY PO) Take by mouth     ??? CefTAZidime 2 G SOLR Infuse intravenously     ??? cycloSPORINE (RESTASIS) 0.05 % ophthalmic emulsion Use 1 Drop in both eyes twice daily.     ??? dexlansoprazole (DEXILANT) 60 MG CPDR capsule Take by mouth     ??? sodium chloride 0.9 % solution Indications: .45% currently      ??? Zoledronic Acid (RECLAST IV) Infuse intravenously Indications: Pt undure of dosage     ??? Diphenoxylate-Atropine (LOMOTIL PO) Take by mouth     ??? Cholecalciferol (VITAMIN D-3 PO) Take 4,000 Units by mouth      ??? Cyanocobalamin (VITAMIN B-12) 1000 MCG  SUBL Place 1 tablet under the tongue     ??? amylase-lipase-protease (CREON) 24000 UNITS per capsule Take 1 capsule by mouth     ??? POTASSIUM CHLORIDE PO Take 10 mEq by mouth     ??? dicyclomine (BENTYL) 10 MG capsule Take 20 mg by mouth every 8 hours     ??? hydrocortisone (PROCTOSOL HC) 2.5 % rectal cream Place rectally     ??? metoprolol (LOPRESSOR) 100 MG tablet Take 100 mg by mouth     ??? mometasone (NASONEX) 50 MCG/ACT nasal spray one daily     ??? promethazine (PHENERGAN) 25 MG tablet Take 25 mg by mouth     ??? sucralfate (CARAFATE) 1 GM/10ML suspension Take 1 g by mouth     ??? TPN ADULT Infuse intravenously 10 hr bag once a day     ??? vitamin D (CHOLECALCIFEROL) 400 UNITS TABS tablet Take 400 Units by mouth daily       No current facility-administered medications for this visit.              Allergies:   Allergies   Allergen Reactions   ??? Fentanyl And Related Itching   ??? Gabapentin Other (See Comments)     Spasms, swelling legs   ??? Levsin [Hyoscyamine Sulfate] Other (See Comments)     Disorientation   ??? Lyrica [Pregabalin]       Cough/wheezing   ??? Morphine Itching   ??? Other      All elixirs: stomach upset:   ??? Topamax [Topiramate]      dehydration            Objective:        Examination:   Mental Status Exam:   GENERAL OBSERVATIONS :.   Appearance appropriately dressed and healthy looking   Demeanor  cooperative  Activity normal.   Eye Contact good    SPEECH   Rate normal  Volume  appropriate  Articulation clear.   Coherent yes.   Spontaneous no.   MOOD & AFFECT :.   Depression  moderate  Anxiety moderate  Anger mild  Anhedonia  mild  Euphoria denies  Irritability mild  Affect appropriate  Associations logical.   Process Goal-Directed  THOUGHT CONTENT :.   Hallucinations No  Delusions. No  Suicidality no plan  Homicidality no plan   Judgement fair   Insight Good  Orientation time yes person yes situation yes place yes.   Attention fair   Concentration fair   Memory Recent: intact Remote: intact.   Language Naming: intact Repetition: intact.   Condition:  not changed  Prognosis:  fair           Examination:   Mental Status Exam:                ASSESSMENT/PLAN:    Janavia was seen today for 6 month follow-up.    Diagnoses and all orders for this visit:    Major depressive disorder, recurrent episode, moderate ([name])    Beck's Depression Scale completed today. Completed form scanned. Score: 6  Partial remission of symptoms    FOLLOW-UP:  6 mo      Encouraged self care, healthy lifestyle

## 2017-01-08 ENCOUNTER — Encounter: Attending: Colon & Rectal Surgery | Primary: Family Medicine

## 2017-01-15 ENCOUNTER — Ambulatory Visit
Admit: 2017-01-15 | Discharge: 2017-01-15 | Payer: MEDICARE | Attending: Colon & Rectal Surgery | Primary: Family Medicine

## 2017-01-15 DIAGNOSIS — K50811 Crohn's disease of both small and large intestine with rectal bleeding: Secondary | ICD-10-CM

## 2017-01-15 NOTE — Progress Notes (Signed)
Review of Systems   Constitutional: Positive for appetite change (decreased) and fatigue. Negative for activity change, chills, fever and unexpected weight change.   Respiratory: Negative for cough, chest tightness, shortness of breath and wheezing.    Cardiovascular: Negative for chest pain, palpitations and leg swelling.   Gastrointestinal: Positive for abdominal pain, diarrhea, nausea and rectal pain. Negative for blood in stool, constipation and vomiting.   Genitourinary: Negative for difficulty urinating and dysuria.   Musculoskeletal: Positive for arthralgias. Negative for joint swelling and myalgias.   Skin: Negative for rash and wound.   Allergic/Immunologic: Positive for immunocompromised state. Negative for environmental allergies and food allergies.   Neurological: Negative for dizziness, weakness and light-headedness.   Hematological: Negative for adenopathy. Does not bruise/bleed easily.   Psychiatric/Behavioral: Negative for self-injury and suicidal ideas.

## 2017-01-16 NOTE — Progress Notes (Signed)
Assessment:     1. Crohn's disease of both small and large intestine with rectal bleeding ([name])     2. SGS (short gut syndrome)     3.      Multiple line sepsis episodes            Plan:     1.  Transplant considerations on hold.    2.  New central line in place  3.  Ongoing TPN  4.  Office visit in 3 months       Objective   History:     Angel Stevens is a 56 y.o. female who presents today for ongoing evaluation and care of Crohn's disease, short gut syndrome and line sepsis/access issues.  She is currently on a TPN holiday.  Gattex was attempted and failed due to intolerance.  She has a fresh right subclavian central line.  Transplant considerations are on hold due to lack of appropriate perioperative support.  [name] care as a whole are discussed as well as prognosis.  Greater than 51% of the 30 minute face to face encounter was spent discussing/counseling the patient.         Physical Exam:   BP 124/62 (Site: Right Arm, Position: Sitting, Cuff Size: Small Adult)    Pulse 76    Temp 98.3 ??F (36.8 ??C)    Ht 5\' 8"  (1.727 m)    Wt 165 lb 11.2 oz (75.2 kg)    BMI 25.19 kg/m??     Physical Exam   Constitutional: She appears well-developed and well-nourished.   Pulmonary/Chest: Effort normal. No respiratory distress.   Abdominal: Soft. She exhibits no distension. There is tenderness.   Nursing note and vitals reviewed.        ROS: Review of Systems as recorded by the medical assistant has been reviewed by me, and I agree except as noted in the HPI.    Past Medical History:   Diagnosis Date   ??? Chronic abdominal pain    ??? Crohn disease ([name])    ??? Depression    ??? Hypertension    ??? Osteoarthritis    ??? Osteoporosis    ??? Short gut syndrome    ??? Ventral hernia        Past Surgical History:   Procedure Laterality Date   ??? ABDOMEN SURGERY     ??? COLONOSCOPY  10/08/2016    biopsies taken   ??? HYSTERECTOMY     ??? KNEE SURGERY     ??? LUNG SURGERY      mass removed from right side       Current Outpatient Prescriptions    Medication Sig Dispense Refill   ??? traZODone (DESYREL) 100 MG tablet Take 2 tablets by mouth nightly 180 tablet 1   ??? DULoxetine (CYMBALTA) 30 MG extended release capsule Take 2 capsules by mouth daily 180 capsule 1   ??? buPROPion (WELLBUTRIN SR) 100 MG extended release tablet Take 1 tablet by mouth 2 times daily 180 tablet 1   ??? amLODIPine (NORVASC) 2.5 MG tablet Take 2.5 mg by mouth     ??? loperamide (IMODIUM) 2 MG capsule      ??? estradiol (ESTRACE) 2 MG tablet      ??? Probiotic Product (PROBIOTIC DAILY PO) Take by mouth     ??? cycloSPORINE (RESTASIS) 0.05 % ophthalmic emulsion Use 1 Drop in both eyes twice daily.     ??? dexlansoprazole (DEXILANT) 60 MG CPDR capsule Take by mouth     ???  sodium chloride 0.9 % solution Indications: .45% currently      ??? Zoledronic Acid (RECLAST IV) Infuse intravenously Indications: Pt undure of dosage     ??? Diphenoxylate-Atropine (LOMOTIL PO) Take by mouth     ??? Cholecalciferol (VITAMIN D-3 PO) Take 4,000 Units by mouth      ??? Cyanocobalamin (VITAMIN B-12) 1000 MCG SUBL Place 1 tablet under the tongue     ??? hydrocortisone (PROCTOSOL HC) 2.5 % rectal cream Place rectally     ??? metoprolol (LOPRESSOR) 100 MG tablet Take 100 mg by mouth     ??? mometasone (NASONEX) 50 MCG/ACT nasal spray one daily     ??? promethazine (PHENERGAN) 25 MG tablet Take 25 mg by mouth     ??? sucralfate (CARAFATE) 1 GM/10ML suspension Take 1 g by mouth     ??? TPN ADULT Infuse intravenously 10 hr bag once a day     ??? vitamin D (CHOLECALCIFEROL) 400 UNITS TABS tablet Take 400 Units by mouth daily     ??? ferric carboxymaltose (INJECTAFER) 750 MG/15ML SOLN IV solution Infuse 750 mg intravenously once Indications: every 3 months      ??? amylase-lipase-protease (CREON) 24000 UNITS per capsule Take 1 capsule by mouth     ??? dicyclomine (BENTYL) 10 MG capsule Take 20 mg by mouth every 8 hours       No current facility-administered medications for this visit.        Social History     Social History   ??? Marital status: Single      Spouse name: N/A   ??? Number of children: N/A   ??? Years of education: N/A     Occupational History   ??? Not on file.     Social History Main Topics   ??? Smoking status: Former Smoker   ??? Smokeless tobacco: Never Used      Comment: Pt quit smoking 5-10 yrs ago   ??? Alcohol use No      Comment: occasional   ??? Drug use: No   ??? Sexual activity: Not on file     Other Topics Concern   ??? Not on file     Social History Narrative   ??? No narrative on file       Family History   Problem Relation Age of Onset   ??? High Blood Pressure Father        Allergies:  Allergies   Allergen Reactions   ??? Fentanyl And Related Itching   ??? Gabapentin Other (See Comments)     Spasms, swelling legs   ??? Levsin [Hyoscyamine Sulfate] Other (See Comments)     Disorientation   ??? Lyrica [Pregabalin]      Cough/wheezing   ??? Morphine Itching   ??? Other      All elixirs: stomach upset:   ??? Topamax [Topiramate]      dehydration       PCP: Gust RungAnita K Amlani, MD    Electronically signed by Lindell NoeMichael Zaidee Rion, MD on 01/16/2017 at 9:00 AM.

## 2017-04-16 ENCOUNTER — Ambulatory Visit
Admit: 2017-04-16 | Discharge: 2017-04-16 | Payer: MEDICARE | Attending: Colon & Rectal Surgery | Primary: Family Medicine

## 2017-04-16 DIAGNOSIS — K912 Postsurgical malabsorption, not elsewhere classified: Secondary | ICD-10-CM

## 2017-04-16 NOTE — Progress Notes (Signed)
Review of Systems   Constitutional: Positive for fatigue. Negative for activity change, appetite change, chills, fever and unexpected weight change.   Respiratory: Negative for cough, chest tightness, shortness of breath and wheezing.    Cardiovascular: Negative for chest pain, palpitations and leg swelling.   Gastrointestinal: Positive for abdominal pain, diarrhea and nausea. Negative for blood in stool, constipation, rectal pain and vomiting.   Genitourinary: Negative for difficulty urinating and dysuria.   Musculoskeletal: Negative for arthralgias, joint swelling and myalgias.   Skin: Negative for rash and wound.   Allergic/Immunologic: Positive for environmental allergies. Negative for food allergies and immunocompromised state.   Neurological: Positive for light-headedness. Negative for dizziness and weakness.   Hematological: Negative for adenopathy. Does not bruise/bleed easily.   Psychiatric/Behavioral: Negative for self-injury and suicidal ideas.

## 2017-04-16 NOTE — Progress Notes (Signed)
Assessment:      Diagnosis Orders   1. SGS (short gut syndrome)                 Plan:     1.  Office visit in 3 months       Objective   History:     Angel Stevens is a 56 y.o. female who presents today for interval follow up.  Small bowel transplant on hold.  Considering bowel lengthening procedure.         Physical Exam:   BP 116/70 (Site: Right Arm, Position: Sitting, Cuff Size: Small Adult)   Pulse 60   Temp 98.6 F (37 C)   Ht 5\' 8"  (1.727 m)   Wt 165 lb (74.8 kg)   BMI 25.09 kg/m     Physical Exam   Constitutional: She appears well-developed and well-nourished.   Pulmonary/Chest: Effort normal. No respiratory distress.   Abdominal: Soft. She exhibits no distension. There is no tenderness.   Nursing note and vitals reviewed.        ROS: Review of Systems as recorded by the medical assistant has been reviewed by me, and I agree except as noted in the HPI.    Past Medical History:   Diagnosis Date   . Chronic abdominal pain    . Crohn disease ([name])    . Depression    . Hypertension    . Osteoarthritis    . Osteoporosis    . Short gut syndrome    . Ventral hernia        Past Surgical History:   Procedure Laterality Date   . ABDOMEN SURGERY     . COLONOSCOPY  10/08/2016    biopsies taken   . HYSTERECTOMY     . KNEE SURGERY     . LUNG SURGERY      mass removed from right side       Current Outpatient Prescriptions   Medication Sig Dispense Refill   . traZODone (DESYREL) 100 MG tablet Take 2 tablets by mouth nightly 180 tablet 1   . DULoxetine (CYMBALTA) 30 MG extended release capsule Take 2 capsules by mouth daily 180 capsule 1   . buPROPion (WELLBUTRIN SR) 100 MG extended release tablet Take 1 tablet by mouth 2 times daily 180 tablet 1   . amLODIPine (NORVASC) 2.5 MG tablet Take 2.5 mg by mouth     . loperamide (IMODIUM) 2 MG capsule      . estradiol (ESTRACE) 2 MG tablet      . ferric carboxymaltose (INJECTAFER) 750 MG/15ML SOLN IV solution Infuse 750 mg intravenously once Indications: every 3 months       . Probiotic Product (PROBIOTIC DAILY PO) Take by mouth     . cycloSPORINE (RESTASIS) 0.05 % ophthalmic emulsion Use 1 Drop in both eyes twice daily.     [name] Kitchen dexlansoprazole (DEXILANT) 60 MG CPDR capsule Take by mouth     . sodium chloride 0.9 % solution Indications: .45% currently      . Zoledronic Acid (RECLAST IV) Infuse intravenously Indications: Pt undure of dosage     . Diphenoxylate-Atropine (LOMOTIL PO) Take by mouth     . Cholecalciferol (VITAMIN D-3 PO) Take 4,000 Units by mouth      . Cyanocobalamin (VITAMIN B-12) 1000 MCG SUBL Place 1 tablet under the tongue     . amylase-lipase-protease (CREON) 24000 UNITS per capsule Take 1 capsule by mouth     . dicyclomine (BENTYL) 10  MG capsule Take 20 mg by mouth every 8 hours     . hydrocortisone (PROCTOSOL HC) 2.5 % rectal cream Place rectally     . metoprolol (LOPRESSOR) 100 MG tablet Take 100 mg by mouth     . mometasone (NASONEX) 50 MCG/ACT nasal spray one daily     . promethazine (PHENERGAN) 25 MG tablet Take 25 mg by mouth     . sucralfate (CARAFATE) 1 GM/10ML suspension Take 1 g by mouth     . TPN ADULT Infuse intravenously 10 hr bag once a day     . vitamin D (CHOLECALCIFEROL) 400 UNITS TABS tablet Take 400 Units by mouth daily       No current facility-administered medications for this visit.        Social History     Social History   . Marital status: Single     Spouse name: N/A   . Number of children: N/A   . Years of education: N/A     Occupational History   . Not on file.     Social History Main Topics   . Smoking status: Former Games developermoker   . Smokeless tobacco: Never Used      Comment: Pt quit smoking 5-10 yrs ago   . Alcohol use No      Comment: occasional   . Drug use: No   . Sexual activity: Not on file     Other Topics Concern   . Not on file     Social History Narrative   . No narrative on file       Family History   Problem Relation Age of Onset   . High Blood Pressure Father        Allergies:  Allergies   Allergen Reactions   . Fentanyl And  Related Itching   . Gabapentin Other (See Comments)     Spasms, swelling legs   . Levsin [Hyoscyamine Sulfate] Other (See Comments)     Disorientation   . Lyrica [Pregabalin]      Cough/wheezing   . Morphine Itching   . Other      All elixirs: stomach upset:   . Topamax [Topiramate]      dehydration       PCP: Gust RungAnita K Amlani, MD    Electronically signed by Lindell NoeMichael Chayce Rullo, MD on 04/16/2017 at 1:44 PM.

## 2017-06-10 ENCOUNTER — Encounter: Payer: MEDICARE | Attending: Registered Nurse | Primary: Family Medicine

## 2017-06-18 ENCOUNTER — Encounter: Payer: MEDICARE | Attending: Registered Nurse | Primary: Family Medicine

## 2017-07-12 MED ORDER — DULOXETINE HCL 30 MG PO CPEP
30 | ORAL_CAPSULE | Freq: Every day | ORAL | 1 refills | Status: DC
Start: 2017-07-12 — End: 2018-01-15

## 2017-07-12 MED ORDER — TRAZODONE HCL 100 MG PO TABS
100 | ORAL_TABLET | Freq: Every evening | ORAL | 1 refills | Status: DC
Start: 2017-07-12 — End: 2018-09-11

## 2017-07-12 MED ORDER — BUPROPION HCL ER (SR) 100 MG PO TB12
100 | ORAL_TABLET | Freq: Two times a day (BID) | ORAL | 1 refills | Status: DC
Start: 2017-07-12 — End: 2018-01-15

## 2017-07-12 NOTE — Progress Notes (Signed)
Subjective:        Chief Complaints:      6 m follow up          HPI:   Visit Duration: 11:00/11:30/30 min  Interim History: Pt present for 74m f/u appt., established with this provider in Feb 2014.  Pt continues to have have many medical problems.  Was in ED yesterday.  States she started to get sick again in June.  Back and forth to the ED- they couldn't figure out what was wrong. Finally admitted her- tests inconclusive.  Pt states they told her they don't know what to do with her. Pain medications give some relief. This continues to be a problem because she needs pain relief and is so limited.  They have been trying many medications- as an alternate to a small bowel transplant.  Still communicating with the man on line, this has helped her mood and has added an interest to her life.  Pt has still been helping with grand kids- this wears her out. Overall daughters stable.   Twin sister Lacie Draft has MS and is in an ECF- she is palliative- prognosis poor.  Offered much support and encouragement.  Denies difficulties with meds. In spite of many medical problems, mood stable on current mental health meds.  Denies cardiac symptoms at present.   Will f/u in approx 6 mo. Denies SI HI     Target Signs/Symptoms: some anxiety, some depression...             Gyn History: n/a          Medications:   Current Outpatient Prescriptions   Medication Sig Dispense Refill   ??? traZODone (DESYREL) 100 MG tablet Take 2 tablets by mouth nightly 180 tablet 1   ??? DULoxetine (CYMBALTA) 30 MG extended release capsule Take 2 capsules by mouth daily 180 capsule 1   ??? buPROPion (WELLBUTRIN SR) 100 MG extended release tablet Take 1 tablet by mouth 2 times daily 180 tablet 1   ??? amLODIPine (NORVASC) 2.5 MG tablet Take 2.5 mg by mouth     ??? loperamide (IMODIUM) 2 MG capsule      ??? estradiol (ESTRACE) 2 MG tablet      ??? ferric carboxymaltose (INJECTAFER) 750 MG/15ML SOLN IV solution Infuse 750 mg intravenously once Indications: every 3 months       ??? Probiotic Product (PROBIOTIC DAILY PO) Take by mouth     ??? cycloSPORINE (RESTASIS) 0.05 % ophthalmic emulsion Use 1 Drop in both eyes twice daily.     ??? dexlansoprazole (DEXILANT) 60 MG CPDR capsule Take by mouth     ??? sodium chloride 0.9 % solution Indications: .45% currently      ??? Zoledronic Acid (RECLAST IV) Infuse intravenously Indications: Pt undure of dosage     ??? Diphenoxylate-Atropine (LOMOTIL PO) Take by mouth     ??? Cholecalciferol (VITAMIN D-3 PO) Take 4,000 Units by mouth      ??? Cyanocobalamin (VITAMIN B-12) 1000 MCG SUBL Place 1 tablet under the tongue     ??? amylase-lipase-protease (CREON) 24000 UNITS per capsule Take 1 capsule by mouth     ??? dicyclomine (BENTYL) 10 MG capsule Take 20 mg by mouth every 8 hours     ??? hydrocortisone (PROCTOSOL HC) 2.5 % rectal cream Place rectally     ??? metoprolol (LOPRESSOR) 100 MG tablet Take 100 mg by mouth     ??? mometasone (NASONEX) 50 MCG/ACT nasal spray one daily     ???  promethazine (PHENERGAN) 25 MG tablet Take 25 mg by mouth     ??? sucralfate (CARAFATE) 1 GM/10ML suspension Take 1 g by mouth     ??? TPN ADULT Infuse intravenously 10 hr bag once a day     ??? vitamin D (CHOLECALCIFEROL) 400 UNITS TABS tablet Take 400 Units by mouth daily       No current facility-administered medications for this visit.              Allergies:   Allergies   Allergen Reactions   ??? Fentanyl And Related Itching   ??? Gabapentin Other (See Comments)     Spasms, swelling legs   ??? Levsin [Hyoscyamine Sulfate] Other (See Comments)     Disorientation   ??? Lyrica [Pregabalin]      Cough/wheezing   ??? Morphine Itching   ??? Other      All elixirs: stomach upset:   ??? Topamax [Topiramate]      dehydration            Objective:        Examination:   Mental Status Exam:   GENERAL OBSERVATIONS :.   Appearance appropriately dressed and healthy looking   Demeanor  cooperative  Activity normal.   Eye Contact good    SPEECH   Rate normal  Volume  appropriate  Articulation clear.   Coherent yes.   Spontaneous  no.   MOOD & AFFECT :.   Depression  moderate  Anxiety moderate  Anger mild  Anhedonia  mild  Euphoria denies  Irritability mild  Affect appropriate  Associations logical.   Process Goal-Directed  THOUGHT CONTENT :.   Hallucinations No  Delusions. No  Suicidality no plan  Homicidality no plan   Judgement fair   Insight Good  Orientation time yes person yes situation yes place yes.   Attention fair   Concentration fair   Memory Recent: intact Remote: intact.   Language Naming: intact Repetition: intact.   Condition:  not changed  Prognosis:  fair           Examination:   Mental Status Exam:                ASSESSMENT/PLAN:    Shunda was seen today for 6 month follow-up.    Diagnoses and all orders for this visit:    Major depressive disorder, recurrent episode, moderate ([name])  -     traZODone (DESYREL) 100 MG tablet; Take 2 tablets by mouth nightly  -     DULoxetine (CYMBALTA) 30 MG extended release capsule; Take 2 capsules by mouth daily  -     buPROPion (WELLBUTRIN SR) 100 MG extended release tablet; Take 1 tablet by mouth 2 times daily      Partial remission of symptoms    FOLLOW-UP:  6 mo      Encouraged self care, healthy lifestyle

## 2017-07-12 NOTE — Telephone Encounter (Signed)
Please print pet support letter for her dog

## 2017-07-16 NOTE — Telephone Encounter (Signed)
Letter in your box for signature. Would you like me to mail this to the pt?

## 2017-07-16 NOTE — Telephone Encounter (Signed)
Yes, thank you

## 2017-07-18 ENCOUNTER — Ambulatory Visit
Admit: 2017-07-18 | Discharge: 2017-07-18 | Payer: MEDICARE | Attending: Colon & Rectal Surgery | Primary: Family Medicine

## 2017-07-18 DIAGNOSIS — K912 Postsurgical malabsorption, not elsewhere classified: Secondary | ICD-10-CM

## 2017-07-18 NOTE — Progress Notes (Signed)
Review of Systems   Constitutional: Positive for appetite change (decreased), chills, fatigue and fever. Negative for activity change and unexpected weight change.   Respiratory: Negative for cough, chest tightness, shortness of breath and wheezing.    Cardiovascular: Negative for chest pain, palpitations and leg swelling.   Gastrointestinal: Positive for abdominal pain, diarrhea, nausea and vomiting. Negative for blood in stool, constipation and rectal pain.   Genitourinary: Positive for difficulty urinating. Negative for dysuria.   Musculoskeletal: Positive for arthralgias. Negative for joint swelling and myalgias.   Skin: Negative for rash and wound.   Allergic/Immunologic: Positive for immunocompromised state. Negative for environmental allergies and food allergies.   Neurological: Positive for light-headedness. Negative for dizziness and weakness.   Hematological: Negative for adenopathy. Does not bruise/bleed easily.   Psychiatric/Behavioral: Negative for self-injury and suicidal ideas.

## 2017-07-18 NOTE — Progress Notes (Signed)
The patient presents for interval follow up and counseling.  Greater than 51% of the face to face encounter was spent discussing/counseling the patient regarding the risks and benefits of upcoming surgical issues as well as the care plan for this patient.  Face to face encounter time was 15 minutes.  She will be discussing possible GI tract lengthening surgery next month at [facility] which seems like a reasonable surgical option.  SB transplant is on indefinite hold.  She has had a difficult month likely due to low grade line sepsis although this is in the process of being established.  She has had positive blood cultures with a new offending microbe.  She is stable on exam today.  Follow up in three months for ongoing monitoring.

## 2017-09-03 ENCOUNTER — Inpatient Hospital Stay: Attending: Family Medicine | Primary: Family Medicine

## 2017-09-03 ENCOUNTER — Encounter: Admit: 2017-09-03 | Primary: Family Medicine

## 2017-09-03 NOTE — Other (Unsigned)
Patient Acct Nbr: [id number]   Primary AUTH/CERT:   Primary Insurance Company Name: [employer]  Primary Insurance Plan name: Medicare A  Primary Insurance Group Number:   Primary Insurance Plan Type: [place] A  Primary Insurance Policy Number: [id number] A    Secondary AUTH/CERT:   Secondary Insurance Company Name: [employer]  Secondary Insurance Plan name: Medicare B  Secondary Insurance Group Number:   Secondary Insurance Plan Type: Vickii Chafe Insurance Policy Number: [id number] A    Tertiary AUTH/CERT:   [employer] Corporation Name: [employer] Plan name: [employer] Group Number:   [employer] Plan Type: WellPoint Number: [id number]

## 2017-09-13 DIAGNOSIS — B961 Klebsiella pneumoniae [K. pneumoniae] as the cause of diseases classified elsewhere: Secondary | ICD-10-CM | POA: Insufficient documentation

## 2017-09-13 DIAGNOSIS — R7881 Bacteremia: Secondary | ICD-10-CM | POA: Insufficient documentation

## 2017-10-01 NOTE — Other (Unsigned)
Patient Acct Nbr: [id number]   Primary AUTH/CERT:   Primary Insurance Company Name: [employer]  Primary Insurance Plan name: Medicare A  Primary Insurance Group Number:   Primary Insurance Plan Type: [place] A  Primary Insurance Policy Number: [id number] A    Secondary AUTH/CERT:   Secondary Insurance Company Name: [employer]  Secondary Insurance Plan name: Medicare B  Secondary Insurance Group Number:   Secondary Insurance Plan Type: Vickii ChafeMcare B  Secondary Insurance Policy Number: [id number]    Tertiary AUTH/CERT:   UAL Corporationertiary Insurance Company Name: DelphiMedicaid  Tertiary Insurance Plan name: DelphiMedicaid  Tertiary Insurance Group Number:   Fortune Br[employer]ertiary Insurance Plan Type: WellPointHealth  Tertiary Insurance Policy Number: [id number]

## 2017-10-04 ENCOUNTER — Inpatient Hospital Stay: Attending: Otolaryngology/Facial Plastic Surgery | Primary: Family Medicine

## 2017-10-22 ENCOUNTER — Ambulatory Visit
Admit: 2017-10-22 | Discharge: 2017-10-22 | Payer: MEDICARE | Attending: Colon & Rectal Surgery | Primary: Family Medicine

## 2017-10-22 DIAGNOSIS — K912 Postsurgical malabsorption, not elsewhere classified: Secondary | ICD-10-CM

## 2017-10-22 NOTE — Progress Notes (Signed)
The patient has had a difficult autumn.  Her twin sister died in October.  The patient herself has had difficulty with line infections and has had two more central lines placed since the last visit.  There is consideration of gallbladder surgery but the [facility] team feels that her first priority is gut lengthening surgery.  I agree.  The above considerations are discussed with the patient at length.  Greater than 51% of the 15 minute face to face encounter was spent discussing/counseling the patient regarding the risks and benefits of surgery as well as the preoperative and postoperative care plan for this patient.  Follow up in three months.

## 2017-10-22 NOTE — Progress Notes (Signed)
Review of Systems   Constitutional: Positive for appetite change. Negative for activity change, chills, fatigue, fever and unexpected weight change.   Respiratory: Positive for cough. Negative for chest tightness, shortness of breath and wheezing.    Cardiovascular: Negative for chest pain, palpitations and leg swelling.   Gastrointestinal: Positive for abdominal pain, diarrhea and nausea. Negative for blood in stool, constipation, rectal pain and vomiting.   Genitourinary: Negative for difficulty urinating and dysuria.   Musculoskeletal: Positive for arthralgias and myalgias. Negative for joint swelling.   Skin: Negative for rash and wound.   Allergic/Immunologic: Negative for environmental allergies, food allergies and immunocompromised state.   Neurological: Positive for weakness. Negative for dizziness and light-headedness.   Hematological: Negative for adenopathy. Does not bruise/bleed easily.   Psychiatric/Behavioral: Negative for self-injury and suicidal ideas.

## 2017-11-01 ENCOUNTER — Encounter

## 2017-11-01 ENCOUNTER — Encounter: Attending: Otolaryngology/Facial Plastic Surgery | Primary: Family Medicine

## 2017-11-01 NOTE — H&P (Deleted)
The clinician documented this note on the incorrect encounter. This information has been reviewed and the note has been moved to the correct visit. Please contact local medical records if there are any questions.

## 2017-11-01 NOTE — H&P (Signed)
10/15/2017 Angel Laudachel A Yeslin Delio, NP-C          Reason for Appointment   1. Pre-op testing   History of Present Illness   HPI:   56 y.o. caucasian female presents for pre-surgical testing for right-sided myringotomy with PE tube placement with Dr. Charlena CrossPhillip Khalil on 11/04/17. Patient c/o right-sided aural fullness and diminished hearing since 05/2017. On examination she was noted to have right-sided serous effusion with associated conductive hearing loss. This has not resolved with Flonase. Surgery was postponed earlier this month as the patient was finishing abx via PICC line due to sepsis from a prior PICC and Crohn's exacerbation.   Current Medications   Taking    Artificial Tear    Bentyl 10 MG Capsule 2 capsules Orally 3 times a day    BuPROPion HCl 100 MG Tablet 2 tablets Orally once a day    Calcium Carbonate 500 MG Tablet Chewable 1 tablet Orally BID    Carafate 1 GM Tablet 1 tablet Orally four times a day    Ciprofloxacin in D5W 400 MG/200ML Solution 200 ml Intravenous every 12 hrs    Cymbalta 60 MG Capsule Delayed Release Particles 1 capsule Orally Once a day    Dexilant 60 MG Capsule Delayed Release 1 capsule Orally Once a day    Entocort EC 3 MG Capsule Delayed Release Particles 2 tablets Orally once a day    Estradiol 2 MG Tablet 1 tablet Orally once a day    Fluconazole in Dextrose 200 MG/100ML Solution 100 ml Intravenous every 24 hrs    Loperamide HCl 2 MG Capsule 1 capsule as needed Orally BID    Metoprolol Tartrate 100 MG Tablet 1 tablet with food Orally Twice a day    Multivitamin Adult - Tablet as directed Orally    Norvasc 5 MG Tablet 1 tablet Orally Once a day    Nystatin , Notes: prn    Probiotic    Promethazine HCl 25 MG Tablet 1 tablet as needed Orally every 6 hrs    Reclast , Notes: once a year 07/17/207    Restasis 0.05 % Emulsion 1 drop into affected eye Ophthalmic Twice a day    Sodium Chloride 0.45 % Solution as directed Intravenous    TPN Electrolytes - Solution as directed Intravenous , Notes:  via PICC    Trazodone HCl 100 MG Tablet 1 tablet at bedtime Orally Once a day    Vitamin B 12 100 MCG Lozenge as directed Orally    Zofran 4 MG Tablet 1 tablet Orally every 6 hours as neede    Discontinued    Ampicillin-Sulbactam Sodium 15 (10-5) GM Solution Reconstituted as directed Intravenous , Notes: IV antibiotics until 11/11 (2 wks.)    Oxycodone HCl 10 MG Tablet 1 tablet as needed Orally every 6 hrs, Notes: #12 tablets, got from hospital    Medication List reviewed and reconciled with the patient       Past Medical History   Crohns disease - Dr. Viona GilmoreGacad.     Short gut syndrome. Chronic TPN since 2003 currently via right anterior chest PICC.     Hypertension.     Chronic Depression.     Osteoporosis.     Osteoarthritis.     Surgical History   Hysterectomy    6 abdominal surgeries in total with last in 2009- colon resection x 4, colostomy/colostomy reversal    Right knee surgery    PICC line - several in the past with most  recent placed 09/2017.    Patient denies past problems with general anesthesia.    Family History   Father: deceased 57 yrs, diagnosed with Hypertension   Mother: deceased 7 yrs, Dementia   3 sister(s) . 2daughter(s) - healthy.    father infection in lungs, chf One sister MS, high BP, 2nd sister diabetic, and thyroid problems, 3rd sister diabetic, lupus and eye problems.   Social History   Tobacco Use:   Smoking Are you a: Former Smoker, How long has it been since you last smoked? > 10 years, When did you stop smoking? 09/18/2010, When did you start smoking? 09/19/1979.   Miscellaneous 2:   no *Alcohol Use . no *Drug Use . Marital Status: single. Occupation/Employment: Disabled. no *Caffeine Use . Exercise/activity: yes, walks, ambulatory at home. Children: yes. * Lives with: alone.    Allergies   Duragesic-25: rash to adhesive - Allergy   Morphine Sulfate: itching - Allergy   Gabapentin: spasam, Edema - Side Effects   Lyrica: dizziness/ edema - Side Effects   Levicyn: leg swelling   Topamax:  dehydration   Hospitalization/Major Diagnostic Procedure   [facility] ER Visits for pain and bladder infection 05/2013   [facility] one and a half days for a blood transfusion 05/2013   [facility] one week for leg pain and MRSA 06/2013   last few years - frequent hospital admissions secondary to sepsis from tunneled PICC vs. colitis 2014 -2018   Crohn's disease exacerbation; sepsis. PICC replacement 09/2017   Review of Systems   General/Constitutional:   Patient Denies chills or fever. [name] Kitchen   HEENT/Neck:   --- NECK Patient denies tender or enlarged lymph nodes. See HPI for details.   Respiratory:   Patient denies pain with respiration, shortness of breath.   Cardiovascular:   Patient denies chest pain, heart palpitations, or dizziness.   Gastrointestinal:   Patient denies Significant history of Crohn's disease with acute exacerbation 10/28 and inpatient hospitalization x 6 days secondary to sepsis from PICC line and Crohn's exacerbation. New PICC was placed at that admission. Yesterday patient was seen in the ED due to PICC line occlusion and this was addressed. Finishing PICC line abx, Ciprofloxacin and Flagyl on 11/24. Back to baseline abdominal discomfort. Denies melena or hematochezia. Continues to follow with GI.[name] Kitchen   Genitourinary:   Patient denies dysuria, difficulty with urination.   Musculoskeletal:   General +chronic osteoarthritis hips bilat. with decrease ROM and weakness. .   Neurologic:   Patient denies new motor or sensory loss.      Vital Signs   Ht 67 in, Wt 159.6 lbs, BMI 24.99, BP 120/70, Pulse sitting 80, Temp 97.4, Oxygen sat % 97.   Examination   General Examination:  GENERAL APPEARANCE: Awake, alert and oriented x 3. In no acute distress or respiratory distress.   HEENT: Head: normocephalic, atraumatic. Eyes: Pupils Equal, Round, Reactive to Light (PERRL), Extra Ocular Muscles Intact (EOMI). Throat: Pharynx without erythema. Oropharynx class MP II.   NECK/THYROID: Neck: Supple, non-tender. There is no evidence of  cervical lymphadenopathy or supraclavicular adenopathy palpable bilaterally. No thromegaly or thyroid nodules identified on palpation. Full range of motion in the neck in all directions.   CARDIOVASCULAR: Regular rate and rhythm. Normal S1S2. No murmurs, gallops, rubs.   CHEST: Right anterior chest PICC.[name] Kitchen   RESPIRATORY: Clear to auscultation bilaterally and without wheezes, rhonchi, rales.   MUSCULOSKELETAL: Strength 5/5 in all 4 extremities.   EXTREMITIES: Full ROM x 4 extremities. Palpable peripheral  pulses x 4 extremities. No edema.   GASTROINTESTINAL: Bowel sounds present. Abdomen soft, non-tender/non-distended. No organomegaly or masses .   GENITOURINARY: Deferred.   RECTAL Deferred.      Assessments   1. Pre-op testing - Z01.818 (Primary)   2. Chronic serous otitis media, right ear - H65.21   3. Conductive hearing loss of right ear with unrestricted hearing of left ear - H90.11   4. Short gut syndrome - K91.2   5. Crohn''s disease with other complication, unspecified gastrointestinal tract location - K50.918   6. Essential hypertension - I10   Treatment   1. Pre-op testing   Notes: The patient has been provided verbal and written pre-surgical instructions and veralizes understanding. Scheduled for right-sided myringotomy with tube placement with Dr. Charlena CrossPhillip Khalil on 11/04/17.      2. Chronic serous otitis media, right ear   Notes:   Given chronic right-sided serous effusion the patient has elected to pursue surgical intervention. f/u as scheduled post-op.  [name] Kitchen.      3. Crohn''s disease with other complication, unspecified gastrointestinal tract location   Notes: Patient notes significant history of Crohn's disease and short gut syndrome with treatment including above medications as well as chronic TPN therapy. She follows with Dr. Viona GilmoreGacad. Patient was [facility] [place] visiting family. Diagnoses include Crohn's exacerbation as well as sepsis from PICC line. The  patient's PICC was replaced - tunneled PICC to right anterior chest. The patient was seen in the ED yesterday for occlusion of PICC of which was addressed. She is finishing Ciprofloxacin and Flagyl abx through PICC; abx end date 11/24. I have told the patient that as long as she is finished with these antibiotics, remains afebrile, and does not have recurrent complications then Dr. Heron NayKhalil will plan to proceed with surgery as planned. We will obtain updated labwork from Dr. Merceda ElksAmlani.      4. Essential hypertension   Notes: Controlled.  Patient instructed to take anti-hypertensive the morning of surgery with sips of water.          Follow Up   as scheduled post-op

## 2017-11-04 LAB — HEMOGLOBIN AND HEMATOCRIT
Hematocrit: 23.5 % — ABNORMAL LOW (ref 35.0–47.0)
Hemoglobin: 8.1 g/dL — ABNORMAL LOW (ref 11.7–16.0)

## 2017-11-04 MED ORDER — LIDOCAINE HCL (PF) 1 % IJ SOLN
1 % | Freq: Once | INTRAMUSCULAR | Status: DC | PRN
Start: 2017-11-04 — End: 2017-11-04

## 2017-11-04 MED ORDER — PROPOFOL 200 MG/20ML IV EMUL
200 | INTRAVENOUS | Status: AC
Start: 2017-11-04 — End: 2017-11-04

## 2017-11-04 MED ORDER — SUCCINYLCHOLINE CHLORIDE 20 MG/ML IJ SOLN
20 | INTRAMUSCULAR | Status: AC
Start: 2017-11-04 — End: 2017-11-04

## 2017-11-04 MED ORDER — OFLOXACIN 0.3 % OP SOLN
0.3 % | Freq: Two times a day (BID) | OPHTHALMIC | 0 refills | Status: AC
Start: 2017-11-04 — End: 2017-11-14

## 2017-11-04 MED ORDER — PROMETHAZINE HCL 25 MG/ML IJ SOLN
25 MG/ML | Freq: Once | INTRAMUSCULAR | Status: DC | PRN
Start: 2017-11-04 — End: 2017-11-04

## 2017-11-04 MED ORDER — OXYCODONE HCL 5 MG PO TABS
5 MG | ORAL | Status: DC | PRN
Start: 2017-11-04 — End: 2017-11-04

## 2017-11-04 MED ORDER — ACETAMINOPHEN 500 MG PO TABS
500 MG | Freq: Once | ORAL | Status: AC
Start: 2017-11-04 — End: 2017-11-04
  Administered 2017-11-04: 13:00:00 1000 mg via ORAL

## 2017-11-04 MED ORDER — ONDANSETRON HCL 4 MG/2ML IJ SOLN
4 MG/2ML | Freq: Once | INTRAMUSCULAR | Status: DC | PRN
Start: 2017-11-04 — End: 2017-11-04

## 2017-11-04 MED ORDER — ALPRAZOLAM 0.25 MG PO TBDP
0.25 MG | ORAL | Status: DC | PRN
Start: 2017-11-04 — End: 2017-11-04

## 2017-11-04 MED ORDER — DEXAMETHASONE SODIUM PHOSPHATE 4 MG/ML IJ SOLN
4 | INTRAMUSCULAR | Status: AC
Start: 2017-11-04 — End: 2017-11-04

## 2017-11-04 MED ORDER — MEPERIDINE HCL 25 MG/ML IJ SOLN
25 MG/ML | INTRAMUSCULAR | Status: DC | PRN
Start: 2017-11-04 — End: 2017-11-04

## 2017-11-04 MED ORDER — LABETALOL HCL 5 MG/ML IV SOLN
5 MG/ML | INTRAVENOUS | Status: DC | PRN
Start: 2017-11-04 — End: 2017-11-04

## 2017-11-04 MED ORDER — FAMOTIDINE 20 MG PO TABS
20 MG | Freq: Once | ORAL | Status: AC
Start: 2017-11-04 — End: 2017-11-04
  Administered 2017-11-04: 13:00:00 20 mg via ORAL

## 2017-11-04 MED ORDER — ESMOLOL HCL 100 MG/10ML IV SOLN
100 | INTRAVENOUS | Status: AC
Start: 2017-11-04 — End: 2017-11-04

## 2017-11-04 MED ORDER — HYDRALAZINE HCL 20 MG/ML IJ SOLN
20 MG/ML | INTRAMUSCULAR | Status: DC | PRN
Start: 2017-11-04 — End: 2017-11-04

## 2017-11-04 MED ORDER — LIDOCAINE HCL (PF) 2 % IJ SOLN
2 | INTRAMUSCULAR | Status: AC
Start: 2017-11-04 — End: 2017-11-04

## 2017-11-04 MED ORDER — DIPHENHYDRAMINE HCL 50 MG/ML IJ SOLN
50 MG/ML | Freq: Once | INTRAMUSCULAR | Status: DC | PRN
Start: 2017-11-04 — End: 2017-11-04

## 2017-11-04 MED ORDER — NORMAL SALINE FLUSH 0.9 % IV SOLN
0.9 % | Freq: Two times a day (BID) | INTRAVENOUS | Status: DC
Start: 2017-11-04 — End: 2017-11-04

## 2017-11-04 MED ORDER — LACTATED RINGERS IV SOLN
INTRAVENOUS | Status: DC
Start: 2017-11-04 — End: 2017-11-04
  Administered 2017-11-04: 13:00:00 via INTRAVENOUS

## 2017-11-04 MED ORDER — NORMAL SALINE FLUSH 0.9 % IV SOLN
0.9 % | INTRAVENOUS | Status: DC | PRN
Start: 2017-11-04 — End: 2017-11-04

## 2017-11-04 MED FILL — FAMOTIDINE 20 MG PO TABS: 20 mg | ORAL | Qty: 1

## 2017-11-04 MED FILL — MAPAP 500 MG PO TABS: 500 mg | ORAL | Qty: 2

## 2017-11-04 MED FILL — ESMOLOL HCL 100 MG/10ML IV SOLN: 100 MG/10ML | INTRAVENOUS | Qty: 10

## 2017-11-04 MED FILL — LIDOCAINE HCL (PF) 2 % IJ SOLN: 2 % | INTRAMUSCULAR | Qty: 5

## 2017-11-04 MED FILL — DEXAMETHASONE SODIUM PHOSPHATE 4 MG/ML IJ SOLN: 4 mg/mL | INTRAMUSCULAR | Qty: 2

## 2017-11-04 MED FILL — PROPOFOL 200 MG/20ML IV EMUL: 200 MG/20ML | INTRAVENOUS | Qty: 20

## 2017-11-04 MED FILL — ANECTINE 20 MG/ML IJ SOLN: 20 mg/mL | INTRAMUSCULAR | Qty: 10

## 2017-11-04 NOTE — Anesthesia Post-Procedure Evaluation (Signed)
Department of Anesthesiology                             Post Anesthesia Note    Name: Angel PouchMaria R Stevens MRN: [id number] DOB: 08-15-1961    (Age-56 y.o.)     This note was authored with review of the notes of the PACU and/or Same day stay and/or direct communication with the patient with an anesthesia care provider.     POST  ANESTHESIA  EVALUATION    No data found.       BP 128/71   Pulse 63   Temp 98.1 F (36.7 C) (Temporal)   Resp 16   Ht 5\' 8"  (1.727 m)   Wt 161 lb (73 kg)   SpO2 100%   BMI 24.48 kg/m      Pain Level: 0    Vital signs: Respiratory and Cardiovascular function within normal limits (See Nursing Record)  Level of consciousness: Patient awake/ Able to participate      Return to baseline mental status: Yes Airway status: Normal   Pain controlled: Yes Dental injury: No   Nausea/Vomiting controlled: Yes Complications: no   Well hydrated: Yes      Patient Experience comments: None expressed    Electronically signed by Dyane DustmanJACOB Rivaan Kendall, MD on 11/04/2017 at 3:04 PM

## 2017-11-04 NOTE — Other (Unsigned)
Patient Acct Nbr: [id number]   Primary AUTH/CERT:   Primary Insurance Company Name: [employer]  Primary Insurance Plan name: Medicare A  Primary Insurance Group Number:   Primary Insurance Plan Type: [place]care A  Primary Insurance Policy Number: [id number] A    Secondary AUTH/CERT:   Secondary Insurance Company Name: [employer]  Secondary Insurance Plan name: Medicare B  Secondary Insurance Group Number:   Secondary Insurance Plan Type: Vickii ChafeMcare B  Secondary Insurance Policy Number: [id number] A    Tertiary AUTH/CERT:   UAL [employer]ertiary Insurance Company Name: DelphiMedicaid  Tertiary Insurance Plan name: DelphiMedicaid  Tertiary Insurance Group Number:   Fortune Br[employer]ertiary Insurance Plan Type: WellPointHealth  Tertiary Insurance Policy Number: [id number]

## 2017-11-04 NOTE — Discharge Instructions (Signed)
[facility]  Phillip Khalil D.O.  Matt Lutz D.O.  [address]  [place], [place] [address]  [phone]    Post-Op Ear Tube Surgery Instructions    General  . Ear tubes have been placed in your ears to treat Eustachian tube dysfunction and its complications.  The Ear tubes remain in place on average 6 months to 2 years.  Please see the attached handout explaining ear tubes.    Diet:  . A short general anesthetic has been given, which may cause some nausea for the next 24 hours.  Advance diet as tolerated.  After the affects of anesthesia have worn off in 24 hours there are no dietary restrictions    Activity  . Resume normal activity    Medications  . Ear drops may be prescribed.  Use as directed on prescription. Save the remaining drops as you may need them from time to time if there is drainage from the ears.  . Discomfort can usually be relieved with Tylenol.  Avoid aspirin containing medications    Work/School    . You may return to work or school the day after the procedure    Follow-up    . A follow up appointment has been scheduled for 3 weeks after surgery.  While the ear tubes are in place you will need to be seen every 4 months.    Additional Information    . Bloody drainage from the ears during the first 3 days following surgery is to be expected.  Excessive blood or green drainage should be addressed by calling the office.    . Swimming or bathing while the ear tubes are in, are permitted without ear plugs.  However, if swimming in rivers, lakes, going underwater in pools or very soapy baths ear protection is recommended.  Ear plugs can be purchased at your local pharmacy.  . If you have any questions or concerns about your post-operative course please feel free to call the office

## 2017-11-04 NOTE — Op Note (Signed)
Surgeon: Heron NayKhalil    Assistant(s): Richrd PrimeGreenbaum    Anesthesia: General    EBL: Less than 1    Drains: None    Specimens: None    Complications: None    Disposition: Stable, extubated to PACU    Pre-procedure diagnosis: Right chronic serous otitis media     Post-procedure diagnosis: Same    Name of procedure: Right myringotomy with PE tube insertion     Indications: The patient is a 56 year old female who presented to the office complaining of right sided aural fullness and muffled hearing since 05/2017. Exam revealed a right middle ear effusion. 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 [name] symptoms did not resolve with appropriate use of nasal steroid spray. Due to failure of medical therapy, myringotomy with PE tube placement was recommended to the patient. Risks, benefits and alternative discussed. These included, but were not limited to bleeding, infection, persistent perforation after the tube should extrude, need for further surgery, chronic drainage from the ear, and general anesthesia related complications, as well as potential hearing loss.  They appeared to understand and asked me to proceed.        Findings: Right serous middle ear fluid noted     Description of procedure: [facility] Informed consent was signed. Patient was brought to the OR and placed in a supine position.   After induction of general LMA was placed. A time out was performed. The right ear was examined with the use of the operative microscope.  Cerumen was removed with wax loop.  There appeared to be fluid in the right middle ear behind an intact right tympanic membrane.  An incision was made anterior and inferior to the malleus, and a no 3 suction was used to suction amber-colored serous fluid from the right middle ear. A Reuter-Bobbin ear tube was placed, and floxin drops were administered.    The patient was then awakened from general anesthesia and returned to the  recovery room in satisfactory condition, where at this point, the family was given both verbal and written instructions with regard to postoperative care.

## 2017-11-04 NOTE — Progress Notes (Signed)
Patient arrived and ID verified. Vital signs stable. Report given. Call light in reach. Pt offered crackers and fluids. Denies nausea and dizziness. Belongings returned to pt

## 2017-11-04 NOTE — Brief Op Note (Signed)
Brief Postoperative Note    Angel PouchMaria R Stevens  Date of Birth:  Apr 18, 1961  [id number]    Pre-operative Diagnosis: Right chronic serous otitis media    Post-operative Diagnosis: Same    Procedure: Right myringotomy with tube placement    Anesthesia: General    Surgeons/Assistants: Jorene MinorsKhalil & Greenbaum    Estimated Blood Loss: less than 50     Complications: None    Specimens: Was Not Obtained    Findings: Right serous middle ear fluid     Electronically signed by Cathlyn ParsonsZachary Nafeesah Lapaglia, DO on 11/04/2017 at 8:49 AM

## 2017-11-05 MED FILL — OFLOXACIN 0.3 % OP SOLN: 0.3 % | OPHTHALMIC | Qty: 5

## 2017-11-26 HISTORY — PX: COLONOSCOPY: SHX174

## 2017-11-30 DIAGNOSIS — N1832 Chronic kidney disease, stage 3b: Secondary | ICD-10-CM | POA: Diagnosis present

## 2017-11-30 DIAGNOSIS — N183 Chronic kidney disease, stage 3 unspecified: Secondary | ICD-10-CM | POA: Diagnosis present

## 2018-01-15 ENCOUNTER — Ambulatory Visit: Admit: 2018-01-15 | Discharge: 2018-01-15 | Payer: MEDICARE | Attending: Registered Nurse | Primary: Family Medicine

## 2018-01-15 ENCOUNTER — Encounter: Attending: Registered Nurse | Primary: Family Medicine

## 2018-01-15 DIAGNOSIS — F331 Major depressive disorder, recurrent, moderate: Secondary | ICD-10-CM

## 2018-01-15 MED ORDER — BUPROPION HCL ER (SR) 100 MG PO TB12
100 | ORAL_TABLET | Freq: Two times a day (BID) | ORAL | 1 refills | Status: DC
Start: 2018-01-15 — End: 2018-06-27

## 2018-01-15 MED ORDER — DULOXETINE HCL 30 MG PO CPEP
30 | ORAL_CAPSULE | Freq: Every day | ORAL | 1 refills | Status: DC
Start: 2018-01-15 — End: 2018-06-27

## 2018-01-15 NOTE — Progress Notes (Signed)
Angel Stevens is a 57 y.o. female  Subjective:        Chief Complaints:      6 m follow up depression, anxiety          HPI:   Visit Duration: 9:3710:17/40 min  Interim History: Pt present for 5050m f/u appt., established with this provider in Feb 2014. Very late today because of weather- pt is very persistent about attending appts as she has to take scat or the bus.   Pt continues to have many medical problems.  Twin sister Angel Stevens died in Oct.  Discussed how poorly sisters treated pt.  This has made the grief more complicated.  End of Dec- pt was hospitalized- she was very ill with sepsis, renal failure-- became agitated, confused, couldn't walk.  All over pain is unbearable, endorses depression, anxiety.   medications give some relief. This continues to be a problem because she needs pain relief and is so limited.  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 Pt will have her long awaited surgery next Wed for her stomach r/t to Crohn's disease.  Pt is looking forward to hoping regain some of her life back.  Daughters will help with recovery.  Pt would like to have  "a few good years". Overall daughters stable. Discussed Angel Stevens's boys- Angel Stevens, Angel Stevens and Angel Stevens.  Daughter Angel Stevens has a boyfriend that has brought stability to her house.  Angel Stevens 2923yr is her oldest son and she has 2 girls, they are doing better.   Offered much support and encouragement.  Denies difficulties with meds. In spite of many medical problems, she attempts to manage depression and anxiety. Taking Trazodone prn. Denies cardiac symptoms at present.   Will f/u in approx 6 mo. Denies SI HI     Target Signs/Symptoms: some anxiety, some depression...             Gyn History: n/a          Medications:   Current Outpatient Prescriptions   Medication Sig Dispense Refill   . buPROPion (WELLBUTRIN SR) 100 MG extended release tablet Take 1 tablet by mouth 2 times daily 180 tablet 1   . DULoxetine (CYMBALTA) 30 MG extended  release capsule Take 2 capsules by mouth daily 180 capsule 1   . traZODone (DESYREL) 100 MG tablet Take 2 tablets by mouth nightly 180 tablet 1   . amLODIPine (NORVASC) 2.5 MG tablet Take 2.5 mg by mouth     . loperamide (IMODIUM) 2 MG capsule      . estradiol (ESTRACE) 2 MG tablet      . ferric carboxymaltose (INJECTAFER) 750 MG/15ML SOLN IV solution Infuse 750 mg intravenously once Indications: every 3 months      . Probiotic Product (PROBIOTIC DAILY PO) Take by mouth     . cycloSPORINE (RESTASIS) 0.05 % ophthalmic emulsion Use 1 Drop in both eyes twice daily.     [name] Kitchen. dexlansoprazole (DEXILANT) 60 MG CPDR capsule Take by mouth     . Zoledronic Acid (RECLAST IV) Infuse intravenously Indications: Pt undure of dosage     . Diphenoxylate-Atropine (LOMOTIL PO) Take by mouth     . Cyanocobalamin (VITAMIN B-12) 1000 MCG SUBL Place 1 tablet under the tongue     . amylase-lipase-protease (CREON) 24000 UNITS per capsule Take 1 capsule by mouth     . dicyclomine (BENTYL) 10 MG capsule Take 20 mg by mouth every 8 hours     .  hydrocortisone (PROCTOSOL HC) 2.5 % rectal cream Place rectally     . metoprolol (LOPRESSOR) 100 MG tablet Take 100 mg by mouth     . mometasone (NASONEX) 50 MCG/ACT nasal spray one daily     . promethazine (PHENERGAN) 25 MG tablet Take 25 mg by mouth     . sucralfate (CARAFATE) 1 GM/10ML suspension Take 1 g by mouth     . vitamin D (CHOLECALCIFEROL) 400 UNITS TABS tablet Take 400 Units by mouth daily       No current facility-administered medications for this visit.              Allergies:   Allergies   Allergen Reactions   . Fentanyl And Related Itching   . Gabapentin Other (See Comments)     Spasms, swelling legs   . Levsin [Hyoscyamine Sulfate] Other (See Comments)     Disorientation   . Lyrica [Pregabalin]      Cough/wheezing   . Morphine Itching   . Other      All elixirs: stomach upset:   . Topamax [Topiramate]      dehydration            Objective:        Examination:   Mental Status Exam:   GENERAL  OBSERVATIONS :.   Appearance appropriately dressed and healthy looking   Demeanor  cooperative  Activity normal.   Eye Contact good    SPEECH   Rate normal  Volume  appropriate  Articulation clear.   Coherent yes.   Spontaneous no.   MOOD & AFFECT :.   Depression  moderate  Anxiety moderate  Anger mild  Anhedonia  mild  Euphoria denies  Irritability mild  Affect appropriate  Associations logical.   Process Goal-Directed  THOUGHT CONTENT :.   Hallucinations No  Delusions. No  Suicidality no plan  Homicidality no plan   Judgement fair   Insight Good  Orientation time yes person yes situation yes place yes.   Attention fair   Concentration fair   Memory Recent: intact Remote: intact.   Language Naming: intact Repetition: intact.   Condition:  not changed  Prognosis:  fair           Examination:   Mental Status Exam:                ASSESSMENT/PLAN:    Jalani was seen today for depression and anxiety.    Diagnoses and all orders for this visit:    Major depressive disorder, recurrent episode, moderate ([name])  -     buPROPion (WELLBUTRIN SR) 100 MG extended release tablet; Take 1 tablet by mouth 2 times daily  -     DULoxetine (CYMBALTA) 30 MG extended release capsule; Take 2 capsules by mouth daily    Grief      Partial remission of symptoms    FOLLOW-UP:  6 mo      Encouraged self care, healthy lifestyle

## 2018-01-21 ENCOUNTER — Ambulatory Visit
Admit: 2018-01-21 | Discharge: 2018-01-21 | Payer: MEDICARE | Attending: Colon & Rectal Surgery | Primary: Family Medicine

## 2018-01-21 DIAGNOSIS — K50811 Crohn's disease of both small and large intestine with rectal bleeding: Secondary | ICD-10-CM

## 2018-01-21 NOTE — Progress Notes (Signed)
The patient is seen for discussion of upcoming surgery.  She is scheduled tomorrow [facility]  She has been hospitalized twice in January for sepsis.  She is currently back home.  She now has CKD stage III.  Her office exam is baseline.  She wants to know if it is the right thing to do at present.  The procedure was discussed in detail and I concur with proceeding with surgery.  Greater than 51% of the 30 minute face to face encounter was spent discussing/counseling the patient regarding the risks and benefits of surgery as well as the preoperative and postoperative care plan for this patient.  Office visit in 3 months for follow up.

## 2018-01-21 NOTE — Progress Notes (Signed)
Review of Systems   Constitutional: Positive for appetite change (decreased) and fatigue. Negative for activity change, chills, fever and unexpected weight change.   Respiratory: Negative for cough, chest tightness, shortness of breath and wheezing.    Cardiovascular: Negative for chest pain, palpitations and leg swelling.   Gastrointestinal: Positive for abdominal pain, diarrhea and nausea. Negative for blood in stool, constipation, rectal pain and vomiting.   Genitourinary: Negative for difficulty urinating and dysuria.   Musculoskeletal: Positive for arthralgias. Negative for joint swelling and myalgias.   Skin: Negative for rash and wound.   Allergic/Immunologic: Positive for immunocompromised state (chron's). Negative for environmental allergies and food allergies.   Neurological: Positive for weakness. Negative for dizziness and light-headedness.   Hematological: Negative for adenopathy. Does not bruise/bleed easily.   Psychiatric/Behavioral: Negative for self-injury and suicidal ideas.

## 2018-01-22 HISTORY — PX: CHOLECYSTECTOMY: SHX55

## 2018-01-22 HISTORY — PX: OMENTECTOMY: SHX2098

## 2018-01-22 HISTORY — PX: ABDOMINAL ADHESION SURGERY: SHX90

## 2018-01-22 HISTORY — PX: OTHER SURGICAL HISTORY: SHX169

## 2018-01-27 DIAGNOSIS — K56609 Unspecified intestinal obstruction, unspecified as to partial versus complete obstruction: Secondary | ICD-10-CM | POA: Insufficient documentation

## 2018-01-27 DIAGNOSIS — R945 Abnormal results of liver function studies: Secondary | ICD-10-CM

## 2018-01-27 DIAGNOSIS — R7989 Other specified abnormal findings of blood chemistry: Secondary | ICD-10-CM

## 2018-03-07 DIAGNOSIS — K529 Noninfective gastroenteritis and colitis, unspecified: Secondary | ICD-10-CM | POA: Insufficient documentation

## 2018-04-16 ENCOUNTER — Ambulatory Visit: Admit: 2018-04-16 | Discharge: 2018-04-16 | Payer: MEDICARE | Attending: Registered Nurse | Primary: Family Medicine

## 2018-04-16 DIAGNOSIS — F331 Major depressive disorder, recurrent, moderate: Secondary | ICD-10-CM

## 2018-04-16 MED ORDER — DULOXETINE HCL 60 MG PO CPEP
60 MG | ORAL_CAPSULE | Freq: Every day | ORAL | 1 refills | Status: DC
Start: 2018-04-16 — End: 2018-09-04

## 2018-04-16 NOTE — Progress Notes (Signed)
Angel Stevens is a 57 y.o. female  Subjective:        Chief Complaints:      6 m follow up depression, anxiety          HPI:   Visit Duration: 1:05/1:35/30 min  Interim History: Pt present for 58m f/u appt., established with this provider in Feb 2014. Had a major surgery in Feb to address the Crohn's disease.  Surgery took 11 hours to attempt to repair the small bowel. States "it was a Social worker".  Followed up with a small bowel follow through to determine if it was functioning properly. It was unbearable. Pt's pain was not resolved with the surgery but states they are finally giving her pain meds which provide some relief.   Twin sister Angel Stevens died in 09/02/23.  Pt is still grieving but it is complicated because sisters treated her poorly.   Pt would like to have  "a few good years". Overall daughters stable. Discussed [name]'s boys- Angel Stevens, Angel Stevens and Angel Stevens.  Daughter Angel Stevens has a boyfriend but he is OCD and it has caused  Angel Stevens 84yr is Angel Stevens's oldest son and she has 2 girls, they are doing better.   Offered much support and encouragement.  Due to increased depression, agreed to trial increase of Duloxetine.  Taking Trazodone prn. Denies cardiac symptoms at present.   Will f/u in approx 6 mo. Denies SI HI     Target Signs/Symptoms:  anxiety, depression...             Gyn History: n/a          Medications:   Current Outpatient Medications   Medication Sig Dispense Refill   ??? DULoxetine (CYMBALTA) 60 MG extended release capsule Take 1 capsule by mouth daily To be taken with  capsule to total 90 mg 90 capsule 1   ??? buPROPion (WELLBUTRIN SR) 100 MG extended release tablet Take 1 tablet by mouth 2 times daily 180 tablet 1   ??? DULoxetine (CYMBALTA) 30 MG extended release capsule Take 2 capsules by mouth daily 180 capsule 1   ??? traZODone (DESYREL) 100 MG tablet Take 2 tablets by mouth nightly 180 tablet 1   ??? amLODIPine (NORVASC) 2.5 MG tablet Take 2.5 mg by mouth     ??? loperamide (IMODIUM) 2 MG capsule      ??? estradiol (ESTRACE)  2 MG tablet      ??? ferric carboxymaltose (INJECTAFER) 750 MG/15ML SOLN IV solution Infuse 750 mg intravenously once Indications: every 3 months      ??? Probiotic Product (PROBIOTIC DAILY PO) Take by mouth     ??? cycloSPORINE (RESTASIS) 0.05 % ophthalmic emulsion Use 1 Drop in both eyes twice daily.     ??? dexlansoprazole (DEXILANT) 60 MG CPDR capsule Take by mouth     ??? Zoledronic Acid (RECLAST IV) Infuse intravenously Indications: Pt undure of dosage     ??? Diphenoxylate-Atropine (LOMOTIL PO) Take by mouth     ??? Cyanocobalamin (VITAMIN B-12) 1000 MCG SUBL Place 1 tablet under the tongue     ??? amylase-lipase-protease (CREON) 24000 UNITS per capsule Take 1 capsule by mouth     ??? dicyclomine (BENTYL) 10 MG capsule Take 20 mg by mouth every 8 hours     ??? hydrocortisone (PROCTOSOL HC) 2.5 % rectal cream Place rectally     ??? metoprolol (LOPRESSOR) 100 MG tablet Take 100 mg by mouth     ??? mometasone (NASONEX) 50 MCG/ACT nasal spray one daily     ???  promethazine (PHENERGAN) 25 MG tablet Take 25 mg by mouth     ??? sucralfate (CARAFATE) 1 GM/10ML suspension Take 1 g by mouth     ??? vitamin D (CHOLECALCIFEROL) 400 UNITS TABS tablet Take 400 Units by mouth daily       No current facility-administered medications for this visit.              Allergies:   Allergies   Allergen Reactions   ??? Fentanyl And Related Itching   ??? Gabapentin Other (See Comments)     Spasms, swelling legs   ??? Levsin [Hyoscyamine Sulfate] Other (See Comments)     Disorientation   ??? Lyrica [Pregabalin]      Cough/wheezing   ??? Morphine Itching   ??? Other      All elixirs: stomach upset:   ??? Topamax [Topiramate]      dehydration            Objective:        Examination:   Mental Status Exam:   GENERAL OBSERVATIONS :.   Appearance appropriately dressed and healthy looking   Demeanor  cooperative  Activity normal.   Eye Contact good    SPEECH   Rate normal  Volume  appropriate  Articulation clear.   Coherent yes.   Spontaneous no.   MOOD & AFFECT :.   Depression   moderate  Anxiety moderate  Anger mild  Anhedonia  mild  Euphoria denies  Irritability mild  Affect appropriate  Associations logical.   Process Goal-Directed  THOUGHT CONTENT :.   Hallucinations No  Delusions. No  Suicidality no plan  Homicidality no plan   Judgement fair   Insight Good  Orientation time yes person yes situation yes place yes.   Attention fair   Concentration fair   Memory Recent: intact Remote: intact.   Language Naming: intact Repetition: intact.   Condition:  not changed  Prognosis:  fair           Examination:   Mental Status Exam:                ASSESSMENT/PLAN:    Da was seen today for 3 month follow-up, depression and anxiety.    Diagnoses and all orders for this visit:    Major depressive disorder, recurrent episode, moderate ([name])  -     DULoxetine (CYMBALTA) 60 MG extended release capsule; Take 1 capsule by mouth daily To be taken with  capsule to total 90 mg          FOLLOW-UP:  6 mo      Encouraged self care, healthy lifestyle

## 2018-04-22 ENCOUNTER — Ambulatory Visit
Admit: 2018-04-22 | Discharge: 2018-04-22 | Payer: MEDICARE | Attending: Colon & Rectal Surgery | Primary: Family Medicine

## 2018-04-22 DIAGNOSIS — K50811 Crohn's disease of both small and large intestine with rectal bleeding: Secondary | ICD-10-CM

## 2018-04-22 NOTE — Progress Notes (Signed)
Review of Systems   Constitutional: Positive for appetite change. Negative for chills, fever and unexpected weight change.   Respiratory: Negative for cough and shortness of breath.    Cardiovascular: Negative for chest pain and leg swelling.   Gastrointestinal: Positive for abdominal pain, diarrhea and vomiting. Negative for blood in stool, constipation, nausea and rectal pain.   Genitourinary: Negative for difficulty urinating and dysuria.   Musculoskeletal: Positive for arthralgias and myalgias.   Skin: Negative for rash and wound.   Allergic/Immunologic: Negative for immunocompromised state.   Neurological: Positive for weakness. Negative for dizziness and light-headedness.   Hematological: Negative for adenopathy. Does not bruise/bleed easily.   Psychiatric/Behavioral: Negative for suicidal ideas.

## 2018-04-22 NOTE — Progress Notes (Signed)
The patient presents for follow up.  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  Cholecystectomy was also performed.  She has had a difficult recovery.  She is depressed by her own admission.  The ongoing adaptation to the procedure and the role of time is emphasized.  Greater than 51% of the 30 minute face to face encounter was spent discussing/counseling the patient regarding the postoperative care plan for this patient and highlighting the physiologic adaptation that should occur.  Follow up in 3 months.

## 2018-05-22 ENCOUNTER — Encounter: Attending: Otolaryngology/Facial Plastic Surgery | Primary: Family Medicine

## 2018-06-06 ENCOUNTER — Encounter: Attending: Otolaryngology/Facial Plastic Surgery | Primary: Family Medicine

## 2018-06-18 ENCOUNTER — Encounter: Attending: Registered Nurse | Primary: Family Medicine

## 2018-06-27 ENCOUNTER — Ambulatory Visit: Admit: 2018-06-27 | Discharge: 2018-06-27 | Payer: MEDICARE | Attending: Registered Nurse | Primary: Family Medicine

## 2018-06-27 DIAGNOSIS — F331 Major depressive disorder, recurrent, moderate: Secondary | ICD-10-CM

## 2018-06-27 MED ORDER — BUPROPION HCL ER (SR) 100 MG PO TB12
100 MG | ORAL_TABLET | Freq: Two times a day (BID) | ORAL | 1 refills | Status: DC
Start: 2018-06-27 — End: 2019-01-07

## 2018-06-27 MED ORDER — DULOXETINE HCL 30 MG PO CPEP
30 MG | ORAL_CAPSULE | Freq: Every day | ORAL | 1 refills | Status: DC
Start: 2018-06-27 — End: 2019-01-07

## 2018-06-27 NOTE — Progress Notes (Signed)
Angel Stevens is a 57 y.o. female  Subjective:        Chief Complaints:      6 m follow up depression, anxiety          HPI:   Visit Duration: 7:30/8:00/30 min  Interim History: Pt present for 2-59m f/u appt., established with this provider in Feb 2014. Had a major surgery in Feb to address the Crohn's disease.  Hx of this 11 hr surgery to attempt to repair the small bowel. States "it was a Social worker".  It was unbearable. Pt states she will not have another surgery.  Pt states she has a great team and they are still working on it and she may eventually have relief and is trying to keep hope.  Pt's pain was not resolved with the surgery but now is engaged in pain management and  pain meds are providing some relief.   Twin sister [name] died in Sep 15, 2024.  Pt is still grieving but it is complicated because sisters treated her poorly.   Pt would like to have  "a few good years". Daughters are in conflict and are not speaking.  Pt feels stuck in the middle. . Discussed [name]'s boys- [name], [name] and [name].  Daughter [name] has a boyfriend but he is OCD and it has caused [name] 38yr is [name]'s oldest son heartache and she has 2 girls. [name] is described as hateful and moody, same as her boyfriend.   Offered much support and encouragement.  Pt reports increase in Duloxetine last appt eased depression symptoms some.  Taking Trazodone prn. Denies cardiac symptoms at present.   Will f/u in approx 2-3 mo. Denies SI HI     Target Signs/Symptoms:  anxiety, depression...             Gyn History: n/a          Medications:   Current Outpatient Medications   Medication Sig Dispense Refill   . HYDROmorphone (DILAUDID) 4 MG tablet Take 4 mg by mouth.     . DULoxetine (CYMBALTA) 30 MG extended release capsule Take 1 capsule by mouth daily To be taken with the 60mg  to total 90 mg 90 capsule 1   . buPROPion (WELLBUTRIN SR) 100 MG extended release tablet Take 1 tablet by mouth 2 times daily 180 tablet 1   . DULoxetine (CYMBALTA) 60 MG extended release  capsule Take 1 capsule by mouth daily To be taken with 30mg  capsule to total 90 mg 90 capsule 1   . amLODIPine (NORVASC) 2.5 MG tablet Take 2.5 mg by mouth     . cycloSPORINE (RESTASIS) 0.05 % ophthalmic emulsion Use 1 Drop in both eyes twice daily.     [name] Kitchen dexlansoprazole (DEXILANT) 60 MG CPDR capsule Take by mouth     . Diphenoxylate-Atropine (LOMOTIL PO) Take by mouth     . Cyanocobalamin (VITAMIN B-12) 1000 MCG SUBL Place 1 tablet under the tongue     . amylase-lipase-protease (CREON) 24000 UNITS per capsule Take 1 capsule by mouth     . dicyclomine (BENTYL) 10 MG capsule Take 20 mg by mouth every 8 hours     . [START ON 07/04/2018] methadone (DOLOPHINE) 10 MG tablet Take 10 mg by mouth.     . traZODone (DESYREL) 100 MG tablet Take 2 tablets by mouth nightly 180 tablet 1   . loperamide (IMODIUM) 2 MG capsule      . estradiol (ESTRACE) 2 MG tablet      . ferric  carboxymaltose (INJECTAFER) 750 MG/15ML SOLN IV solution Infuse 750 mg intravenously once Indications: every 3 months      . Probiotic Product (PROBIOTIC DAILY PO) Take by mouth     . Zoledronic Acid (RECLAST IV) Infuse intravenously Indications: Pt undure of dosage     . hydrocortisone (PROCTOSOL HC) 2.5 % rectal cream Place rectally     . metoprolol (LOPRESSOR) 100 MG tablet Take 100 mg by mouth     . mometasone (NASONEX) 50 MCG/ACT nasal spray one daily     . promethazine (PHENERGAN) 25 MG tablet Take 25 mg by mouth     . sucralfate (CARAFATE) 1 GM/10ML suspension Take 1 g by mouth     . vitamin D (CHOLECALCIFEROL) 400 UNITS TABS tablet Take 400 Units by mouth daily       No current facility-administered medications for this visit.              Allergies:   Allergies   Allergen Reactions   . Fentanyl And Related Itching   . Gabapentin Other (See Comments)     Spasms, swelling legs   . Levsin [Hyoscyamine Sulfate] Other (See Comments)     Disorientation   . Lyrica [Pregabalin]      Cough/wheezing   . Morphine Itching   . Other      All elixirs: stomach  upset:   . Topamax [Topiramate]      dehydration            Objective:        Examination:   Mental Status Exam:   GENERAL OBSERVATIONS :.   Appearance appropriately dressed and healthy looking   Demeanor  cooperative  Activity normal.   Eye Contact good    SPEECH   Rate normal  Volume  appropriate  Articulation clear.   Coherent yes.   Spontaneous no.   MOOD & AFFECT :.   Depression  moderate  Anxiety moderate  Anger mild  Anhedonia  mild  Euphoria denies  Irritability mild  Affect appropriate  Associations logical.   Process Goal-Directed  THOUGHT CONTENT :.   Hallucinations No  Delusions. No  Suicidality no plan  Homicidality no plan   Judgement fair   Insight Good  Orientation time yes person yes situation yes place yes.   Attention fair   Concentration fair   Memory Recent: intact Remote: intact.   Language Naming: intact Repetition: intact.   Condition:  not changed  Prognosis:  fair           Examination:   Mental Status Exam:                ASSESSMENT/PLAN:    Angel Stevens was seen today for follow-up, depression, anxiety and stress.    Diagnoses and all orders for this visit:    Major depressive disorder, recurrent episode, moderate ([name])  -     DULoxetine (CYMBALTA) 30 MG extended release capsule; Take 1 capsule by mouth daily To be taken with the 60mg  to total 90 mg  -     buPROPion (WELLBUTRIN SR) 100 MG extended release tablet; Take 1 tablet by mouth 2 times daily          FOLLOW-UP:  6 mo      Encouraged self care, healthy lifestyle

## 2018-07-22 ENCOUNTER — Ambulatory Visit
Admit: 2018-07-22 | Discharge: 2018-07-22 | Payer: MEDICARE | Attending: Colon & Rectal Surgery | Primary: Family Medicine

## 2018-07-22 DIAGNOSIS — K50811 Crohn's disease of both small and large intestine with rectal bleeding: Secondary | ICD-10-CM

## 2018-07-22 NOTE — Progress Notes (Signed)
Review of Systems   Constitutional: Positive for unexpected weight change. Negative for chills and fever.   Eyes: Negative for visual disturbance.   Respiratory: Negative for cough and shortness of breath.    Cardiovascular: Negative for chest pain.   Gastrointestinal: Positive for abdominal pain, diarrhea, nausea, rectal pain and vomiting. Negative for blood in stool and constipation.   Genitourinary: Negative for difficulty urinating and dysuria.   Musculoskeletal: Positive for arthralgias and myalgias.   Skin: Negative for rash and wound.   Allergic/Immunologic: Negative for immunocompromised state.   Neurological: Positive for weakness. Negative for dizziness and light-headedness.

## 2018-07-22 NOTE — Progress Notes (Signed)
The patient presents for follow up and discussion of lifestyle adaptations 5 months s/p bowel lengthening surgery.  She has not yet fully adapted to the new anatomy.  Various aspects of her clinical course to date are discussed and possible solutions proffered.  Greater than 51% of the 30 minute face to face encounter was spent discussing/counseling the patient regarding the postoperative care plan for this patient.  Follow up in 3 months to reassess.

## 2018-08-04 ENCOUNTER — Ambulatory Visit
Admit: 2018-08-04 | Discharge: 2018-08-04 | Payer: MEDICARE | Attending: Foot and Ankle Surgery | Primary: Family Medicine

## 2018-08-04 DIAGNOSIS — L03116 Cellulitis of left lower limb: Secondary | ICD-10-CM

## 2018-08-04 NOTE — Progress Notes (Signed)
[facility]  [facility] [place]  [address]  [address]  [place] [address]  Dept: [phone]  Loc: [phone]          Angel Stevens  04/08/1961  [id number]  08/04/2018    HPI  Angel Stevens is a 57 y.o. female who is here today for evaluation of her bilateral feet. Patient was diagnosed with cellulitis in both lower legs and top of bilateral feet. She was admitted 07/18/18 with IV antibiotics.  She continues to have pain and swelling especially on the left side.  She made the appointment today because an x-ray was performed and she was told that she had a bone abnormality in the left leg.  She states that she had a similar abnormality at one point in her left hip.  She denies issues with sickle cell trait or disease.    Angel Stevens has a chief complaint of pain in bilateral feet.    Angel Stevens reports the current issue started approximately 1 month(s) ago.    Current symptoms: pain, swelling and redness    Angel Stevens has seen another medical professional for this problem: Yes; Patient was admitted in [facility] 07/18/18    Angel Stevens has tried or been treated with thefollowing: antibiotics, rest, avoids activities that aggravate the problem and finds shoes that are comfortable    Review of Systems  Surgical Risk Factors:   Allergies to Metals or Latex: NO   Have you been treated for a blood clot:  NO   Have you had a history of bleeding disorder:  NO   Have you had a history of Anesthetic problems: NO   Do you have tendency to bruise easily:  NO   Do you experience prolonged or excessive bleeding from cuts or after surgery:NO  General/Constitutional:     General: NO   Cancer:  NO   Acute/Chronic Infections: NO  HEENT/Neck:   Problems with theThroat:  NO   Problems with the Eyes:  NO   Problems with the Ears:  NO   Problems with the Nose and Sinuses:  NO  Endocrine:   Problems with Diabetes: NO   Problems with Thyroid Disorder: NO  Thorax:     Problems with  the Heart: NO   Problems with the Lung:  NO  Cardiovascular:   Problems with Circulation:  NO   Problems with High Blood pressure:  Yes, hypertension  Gastrointestinal:   Problems with Ulcers:  NO   Problems with the Liver:  Yes; Liver function elevated   Problems with Bowel Habits:  Yes, Crohns disease  Genitourinary:   Problems with the Genitals: NO   Urinary problems: NO   Kidney disease or stones:  Yes, Stage 3 kidney disease  Skin:    Any general problems:  NO  Neurologic:   Dizziness, blurred vision, headaches, problems with balance :  NO   Seizures or Stroke: NO  Psychiatric:   Emotional or Psychological disorders:  NO   Depression or Anxiety:  Yes     Allergies   Allergen Reactions   ??? Fentanyl And Related Itching   ??? Gabapentin Other (See Comments)     Spasms, swelling legs   ??? Levsin [Hyoscyamine Sulfate] Other (See Comments)     Disorientation   ??? Lyrica [Pregabalin]      Cough/wheezing   ??? Morphine Itching   ??? Other      All elixirs: stomach upset:   ???  Topamax [Topiramate]      dehydration     Current Outpatient Medications   Medication Sig Dispense Refill   ??? DULoxetine (CYMBALTA) 30 MG extended release capsule Take 1 capsule by mouth daily To be taken with the 60mg  to total 90 mg 90 capsule 1   ??? buPROPion (WELLBUTRIN SR) 100 MG extended release tablet Take 1 tablet by mouth 2 times daily 180 tablet 1   ??? DULoxetine (CYMBALTA) 60 MG extended release capsule Take 1 capsule by mouth daily To be taken with 30mg  capsule to total 90 mg 90 capsule 1   ??? traZODone (DESYREL) 100 MG tablet Take 2 tablets by mouth nightly 180 tablet 1   ??? amLODIPine (NORVASC) 2.5 MG tablet Take 2.5 mg by mouth     ??? loperamide (IMODIUM) 2 MG capsule      ??? estradiol (ESTRACE) 2 MG tablet      ??? ferric carboxymaltose (INJECTAFER) 750 MG/15ML SOLN IV solution Infuse 750 mg intravenously once Indications: every 3 months      ??? Probiotic Product (PROBIOTIC DAILY PO) Take by mouth     ??? cycloSPORINE (RESTASIS) 0.05 % ophthalmic  emulsion Use 1 Drop in both eyes twice daily.     ??? dexlansoprazole (DEXILANT) 60 MG CPDR capsule Take by mouth     ??? Zoledronic Acid (RECLAST IV) Infuse intravenously Indications: Pt undure of dosage     ??? Diphenoxylate-Atropine (LOMOTIL PO) Take by mouth     ??? Cyanocobalamin (VITAMIN B-12) 1000 MCG SUBL Place 1 tablet under the tongue     ??? dicyclomine (BENTYL) 10 MG capsule Take 20 mg by mouth every 8 hours     ??? hydrocortisone (PROCTOSOL HC) 2.5 % rectal cream Place rectally     ??? metoprolol (LOPRESSOR) 100 MG tablet Take 100 mg by mouth     ??? mometasone (NASONEX) 50 MCG/ACT nasal spray one daily     ??? promethazine (PHENERGAN) 25 MG tablet Take 25 mg by mouth     ??? sucralfate (CARAFATE) 1 GM/10ML suspension Take 1 g by mouth     ??? vitamin D (CHOLECALCIFEROL) 400 UNITS TABS tablet Take 400 Units by mouth daily     ??? amylase-lipase-protease (CREON) 24000 UNITS per capsule Take 1 capsule by mouth       No current facility-administered medications for this visit.      Past Medical History:   Diagnosis Date   ??? Cellulitis    ??? Chronic abdominal pain    ??? Crohn disease ([name])    ??? Depression    ??? Hypertension    ??? Osteoarthritis    ??? Osteoporosis    ??? Short gut syndrome    ??? Ventral hernia      Past Surgical History:   Procedure Laterality Date   ??? ABDOMEN SURGERY     ??? COLONOSCOPY  10/08/2016    biopsies taken   ??? HYSTERECTOMY     ??? KNEE SURGERY     ??? LUNG SURGERY      mass removed from right side   ??? MYRINGOTOMY Right 11/04/2017     Social History     Socioeconomic History   ??? Marital status: Single     Spouse name: Not on file   ??? Number of children: Not on file   ??? Years of education: Not on file   ??? Highest education level: Not on file   Occupational History   ??? Not on file   Social Needs   ???  Financial resource strain: Not on file   ??? Food insecurity:     Worry: Not on file     Inability: Not on file   ??? Transportation needs:     Medical: Not on file     Non-medical: Not on file   Tobacco Use   ??? Smoking status:  Former Smoker   ??? Smokeless tobacco: Never Used   ??? Tobacco comment: Pt quit smoking 5-10 yrs ago   Substance and Sexual Activity   ??? Alcohol use: No     Comment: occasional   ??? Drug use: No   ??? Sexual activity: Not on file   Lifestyle   ??? Physical activity:     Days per week: Not on file     Minutes per session: Not on file   ??? Stress: Not on file   Relationships   ??? Social connections:     Talks on phone: Not on file     Gets together: Not on file     Attends religious service: Not on file     Active member of club or organization: Not on file     Attends meetings of clubs or organizations: Not on file     Relationship status: Not on file   ??? Intimate partner violence:     Fear of current or ex partner: Not on file     Emotionally abused: Not on file     Physically abused: Not on file     Forced sexual activity: Not on file   Other Topics Concern   ??? Not on file   Social History Narrative   ??? Not on file     Family History   Problem Relation Age of Onset   ??? High Blood Pressure Father      Vitals:    08/04/18 1350   BP: (!) 159/77   Pulse: 62       Physical Exam   This is an age appropriate appearing female who is alert and oriented x 3.  The patient appears well nourished.  Psychiatric: The patient is able to verbalize normally and seems to have a good understanding of her situation.    HENT:   Head: Normocephalic.   Right Ear: External ear normal.   Left Ear: External ear normal.   Nose: Nose normal.   Neck: Normal range of motion.   Pulmonary/Chest: Effort normal. No accessory muscle usage. No respiratory distress.   Abdominal: Normal appearance.        Left lower extremity examination    Lymphatic System:  No signs of lymphedema, no lymphadenopathy  Mild swelling of the leg and ankle     Vascular:  Dorsalis pedis pulse: 2+  Posterior tibial pulse:  2+  Capillary refill is less than 3 seconds  Varicosities absent     Skin/nails:  Normal appearance, warm and dry.  No erythema is seen  No clubbing, no  cyanosis  Nails normal appearance  Hair growth present on foot and toes    Neurologic:  Sensation intact to light touch throughout the foot and the ankle, reflexes normal    Muscle:  Muscle strength testing:    Anterior tibialis: 5/5    Posterior tibialis: 5/5    Peroneus brevis:  5/5    Peroneus longus:  5/5    Gastrocsoleus:  5/5     Mildly tender to palpation in the lower third of the leg, the ankle and the foot.  No areas of  fluctuance or palpable.    Gait and Station:  Angel Stevens is able to ambulate with a normal gait  Angel Stevens is able to stand unassisted and maintains balance    No clinical photos were taken during today's visit     Radiographic finding:     I reviewed the Zaryiah's previous x-ray examination and noted the following findings.  3 nonweightbearing views left ankle were reviewed.  The ankle mortise syndesmosis are anatomically aligned.  No fractures or dislocations are seen.  Calcification is noted in the metaphyseal diaphyseal region of the distal tibia that most likely represents a bone infarct versus an enchondroma versus a bone island.  The process appears benign with no signs of bone erosion or other abnormality..    Lab Review:  No labs were reviewed/no labs were available for review this visit.    IMPRESSION:      ICD-10-CM    1. Cellulitis of left leg L03.116        MEDICAL DECISION MAKING:    I had a discussion with Angel Stevens to make sure she had a good understanding of what I think the main issues and diagnoses are that are affecting her today, and reviewed the plan going forward.    I explained to Angel Stevens that her leg will likely be swollen and somewhat tender for the next month or 2 as the swelling from the previous cellulitis resolves.  I explained that the finding on the x-rays has been present for many many years and does not represent a cause of her cellulitis nor is it present because of the cellulitis.  The findings on x-rays do not require any treatment or workup.    Return if symptoms worsen  or fail to improve.    Voice recognition was used for portions of this note and although it was reviewed prior to signing some incorrect words or phrases could be present.    Electronically signedby Moreen Fowler, MD on 08/04/18 at 1:48 PM

## 2018-08-28 DIAGNOSIS — K838 Other specified diseases of biliary tract: Secondary | ICD-10-CM | POA: Insufficient documentation

## 2018-08-29 ENCOUNTER — Encounter: Payer: MEDICARE | Attending: Registered Nurse | Primary: Family Medicine

## 2018-09-04 ENCOUNTER — Ambulatory Visit: Admit: 2018-09-04 | Discharge: 2018-09-04 | Payer: MEDICARE | Attending: Registered Nurse | Primary: Family Medicine

## 2018-09-04 DIAGNOSIS — F331 Major depressive disorder, recurrent, moderate: Secondary | ICD-10-CM

## 2018-09-04 MED ORDER — DULOXETINE HCL 60 MG PO CPEP
60 MG | ORAL_CAPSULE | Freq: Every day | ORAL | 1 refills | Status: DC
Start: 2018-09-04 — End: 2019-01-07

## 2018-09-04 NOTE — Progress Notes (Signed)
Angel Stevens is a 57 y.o. female  Subjective:        Chief Complaints:      2 m follow up depression, anxiety, stress          HPI:   Visit Duration: 1:30/2:10/40 min  Interim History: Pt present for 59m f/u appt., established with this provider in Feb 2014. Pt states she was treated for cellulitis on her legs, antibiotic was not effective- realized it was not cellulitis- not sure what it is.  Infection control is involved to determine cause.  Hx of major surgery in Feb r/t Crohn's disease pt described it as "a diaster".    Pt states she has a great team and they are still working on it and she may eventually have relief and is trying to keep hope.  Pt's pain was not resolved with the surgery but now is engaged in pain management and  pain meds are providing some relief.   Chronic health problems continue to be a road block for her mental health.  Twin sister [name] died last 09-29-23, just had the one yr anniversary.  [name]'s daughter does not speak to pt.   Pt's daughters are still  in conflict and are not speaking, trying to stay out of it. Discussed [name]'s boys- [name], [name] and [name]. Pt and Mason do not get along.  Talked in length about this, he has a different father and attends a different school and pt feels he gets special treatment.  Feels bad for [facility]- feels he gets picked on at school.  Pt has separated herself from him and has decided she will only visit when he is with other grandparents for a while.  This is a better alternative than conflict.  Daughter [name] still has the same boyfriend but due to his OCD - [name]'s son [name] 76yr and her  2 girls have struggled. [name] is described as hateful and moody, same as her boyfriend.   Offered much support and encouragement.  Pt reports previous increase in Duloxetine has helped with depressive symptoms some.  Taking Trazodone prn. Denies cardiac symptoms at present.   Will f/u in approx 2-3 mo. Denies SI HI     Target Signs/Symptoms:  anxiety, depression...              Gyn History: n/a          Medications:   Current Outpatient Medications   Medication Sig Dispense Refill   ??? DULoxetine (CYMBALTA) 60 MG extended release capsule Take 1 capsule by mouth daily To be taken with 30mg  capsule to total 90 mg 90 capsule 1   ??? DULoxetine (CYMBALTA) 30 MG extended release capsule Take 1 capsule by mouth daily To be taken with the 60mg  to total 90 mg 90 capsule 1   ??? buPROPion (WELLBUTRIN SR) 100 MG extended release tablet Take 1 tablet by mouth 2 times daily 180 tablet 1   ??? traZODone (DESYREL) 100 MG tablet Take 2 tablets by mouth nightly 180 tablet 1   ??? amLODIPine (NORVASC) 2.5 MG tablet Take 2.5 mg by mouth     ??? loperamide (IMODIUM) 2 MG capsule      ??? estradiol (ESTRACE) 2 MG tablet      ??? ferric carboxymaltose (INJECTAFER) 750 MG/15ML SOLN IV solution Infuse 750 mg intravenously once Indications: every 3 months      ??? Probiotic Product (PROBIOTIC DAILY PO) Take by mouth     ??? cycloSPORINE (RESTASIS) 0.05 % ophthalmic  emulsion Use 1 Drop in both eyes twice daily.     ??? dexlansoprazole (DEXILANT) 60 MG CPDR capsule Take by mouth     ??? Zoledronic Acid (RECLAST IV) Infuse intravenously Indications: Pt undure of dosage     ??? Diphenoxylate-Atropine (LOMOTIL PO) Take by mouth     ??? Cyanocobalamin (VITAMIN B-12) 1000 MCG SUBL Place 1 tablet under the tongue     ??? dicyclomine (BENTYL) 10 MG capsule Take 20 mg by mouth every 8 hours     ??? hydrocortisone (PROCTOSOL HC) 2.5 % rectal cream Place rectally     ??? metoprolol (LOPRESSOR) 100 MG tablet Take 100 mg by mouth     ??? mometasone (NASONEX) 50 MCG/ACT nasal spray one daily     ??? promethazine (PHENERGAN) 25 MG tablet Take 25 mg by mouth     ??? sucralfate (CARAFATE) 1 GM/10ML suspension Take 1 g by mouth     ??? vitamin D (CHOLECALCIFEROL) 400 UNITS TABS tablet Take 400 Units by mouth daily     ??? amylase-lipase-protease (CREON) 24000 UNITS per capsule Take 1 capsule by mouth       No current facility-administered medications for this  visit.              Allergies:   Allergies   Allergen Reactions   ??? Fentanyl And Related Itching   ??? Gabapentin Other (See Comments)     Spasms, swelling legs   ??? Levsin [Hyoscyamine Sulfate] Other (See Comments)     Disorientation   ??? Lyrica [Pregabalin]      Cough/wheezing   ??? Morphine Itching   ??? Other      All elixirs: stomach upset:   ??? Topamax [Topiramate]      dehydration            Objective:        Examination:   Mental Status Exam:   GENERAL OBSERVATIONS :.   Appearance appropriately dressed and healthy looking   Demeanor  cooperative  Activity normal.   Eye Contact good    SPEECH   Rate normal  Volume  appropriate  Articulation clear.   Coherent yes.   Spontaneous no.   MOOD & AFFECT :.   Depression  moderate  Anxiety moderate  Anger mild  Anhedonia  mild  Euphoria denies  Irritability mild  Affect appropriate  Associations logical.   Process Goal-Directed  THOUGHT CONTENT :.   Hallucinations No  Delusions. No  Suicidality no plan  Homicidality no plan   Judgement fair   Insight Good  Orientation time yes person yes situation yes place yes.   Attention fair   Concentration fair   Memory Recent: intact Remote: intact.   Language Naming: intact Repetition: intact.   Condition:  not changed  Prognosis:  fair           Examination:   Mental Status Exam:                ASSESSMENT/PLAN:    Derin was seen today for follow-up, depression, anxiety and stress.    Diagnoses and all orders for this visit:    Major depressive disorder, recurrent episode, moderate ([name])  -     DULoxetine (CYMBALTA) 60 MG extended release capsule; Take 1 capsule by mouth daily To be taken with 30mg  capsule to total 90 mg          FOLLOW-UP:  6 mo      Encouraged self care, healthy lifestyle

## 2018-09-11 ENCOUNTER — Encounter

## 2018-09-12 MED ORDER — TRAZODONE HCL 100 MG PO TABS
100 MG | ORAL_TABLET | Freq: Every evening | ORAL | 1 refills | Status: DC
Start: 2018-09-12 — End: 2019-01-07

## 2018-09-23 ENCOUNTER — Ambulatory Visit: Primary: Family Medicine

## 2018-09-30 ENCOUNTER — Ambulatory Visit: Primary: Family Medicine

## 2018-10-29 ENCOUNTER — Ambulatory Visit
Admit: 2018-10-29 | Discharge: 2018-10-29 | Payer: MEDICARE | Attending: Colon & Rectal Surgery | Primary: Family Medicine

## 2018-10-29 DIAGNOSIS — K50811 Crohn's disease of both small and large intestine with rectal bleeding: Secondary | ICD-10-CM

## 2018-10-29 NOTE — Progress Notes (Signed)
Assessment:      Diagnosis Orders   1. Crohn's disease of both small and large intestine with rectal bleeding ([name])     2. SGS (short gut syndrome)                 Plan:     1.  The patient is done with surgeries at this point.    2.  Continued follow up with her medical team.  3.  Office follow up here in three months.       Objective   History:     Angel Stevens is a 57 y.o. female who presents today for discussion/counseling regarding her Crohn's, short gut and gut lengthening surgery.  It has been none months since her surgery.  She is still on TPN.  Her weight has fallen over 2019 from 165 to 151 today.  She does better when she doesn't eat at all.  She has had no recent line sepsis or TPN problems.  Greater than 51% of the 30 minute face to face encounter was spent discussing/counseling the patient regarding her previous surgery as well as the postoperative care plan for this patient.         Physical Exam:   BP 122/76 (Site: Right Upper Arm, Position: Sitting, Cuff Size: Small Adult)    Pulse 104    Temp 98.5 ??F (36.9 ??C)    Ht 5\' 8"  (1.727 m)    Wt 151 lb (68.5 kg)    BMI 22.96 kg/m??     Physical Exam  Vitals signs and nursing note reviewed.   Constitutional:       Appearance: She is well-developed.   Pulmonary:      Effort: Pulmonary effort is normal. No respiratory distress.   Abdominal:      General: There is no distension.      Palpations: Abdomen is soft.      Tenderness: There is no tenderness.   Skin:     Comments: Chronic changes both legs in stocking distribution, unknown etiology.           ROS: Review of Systems as recordedby the medical assistant has been reviewed by me, and I agree except as noted inthe HPI.    Past Medical History:   Diagnosis Date   ??? Cellulitis    ??? Chronic abdominal pain    ??? Crohn disease ([name])    ??? Depression    ??? Hypertension    ??? Osteoarthritis    ??? Osteoporosis    ??? Short gut syndrome    ??? Ventral hernia        Past Surgical History:   Procedure Laterality Date    ??? ABDOMEN SURGERY     ??? COLONOSCOPY  10/08/2016    biopsies taken   ??? HYSTERECTOMY     ??? KNEE SURGERY     ??? LUNG SURGERY      mass removed from right side   ??? MYRINGOTOMY Right 11/04/2017       Current Outpatient Medications   Medication Sig Dispense Refill   ??? HYDROmorphone (DILAUDID) 4 MG tablet Take 4 mg by mouth every 3 hours as needed for Pain.     ??? methadone (DOLOPHINE) 10 MG tablet Take 10 mg by mouth every 4 hours as needed for Pain.     ??? sodium chloride 0.45 % infusion Infuse intravenously continuous     ??? Multiple Vitamin (MULTI-VITAMIN DAILY PO) Take by mouth     ???  traZODone (DESYREL) 100 MG tablet Take 2 tablets by mouth nightly 180 tablet 1   ??? DULoxetine (CYMBALTA) 60 MG extended release capsule Take 1 capsule by mouth daily To be taken with 30mg  capsule to total 90 mg 90 capsule 1   ??? DULoxetine (CYMBALTA) 30 MG extended release capsule Take 1 capsule by mouth daily To be taken with the 60mg  to total 90 mg 90 capsule 1   ??? buPROPion (WELLBUTRIN SR) 100 MG extended release tablet Take 1 tablet by mouth 2 times daily 180 tablet 1   ??? amLODIPine (NORVASC) 2.5 MG tablet Take 2.5 mg by mouth     ??? loperamide (IMODIUM) 2 MG capsule      ??? estradiol (ESTRACE) 2 MG tablet      ??? ferric carboxymaltose (INJECTAFER) 750 MG/15ML SOLN IV solution Infuse 750 mg intravenously once Indications: every 3 months      ??? Probiotic Product (PROBIOTIC DAILY PO) Take by mouth     ??? cycloSPORINE (RESTASIS) 0.05 % ophthalmic emulsion Use 1 Drop in both eyes twice daily.     ??? dexlansoprazole (DEXILANT) 60 MG CPDR capsule Take by mouth     ??? Zoledronic Acid (RECLAST IV) Infuse intravenously Indications: Pt undure of dosage     ??? Diphenoxylate-Atropine (LOMOTIL PO) Take by mouth     ??? Cyanocobalamin (VITAMIN B-12) 1000 MCG SUBL Place 1 tablet under the tongue     ??? dicyclomine (BENTYL) 10 MG capsule Take 20 mg by mouth every 8 hours     ??? hydrocortisone (PROCTOSOL HC) 2.5 % rectal cream Place rectally     ??? metoprolol  (LOPRESSOR) 100 MG tablet Take 100 mg by mouth     ??? promethazine (PHENERGAN) 25 MG tablet Take 25 mg by mouth     ??? sucralfate (CARAFATE) 1 GM/10ML suspension Take 1 g by mouth     ??? vitamin D (CHOLECALCIFEROL) 400 UNITS TABS tablet Take 400 Units by mouth daily     ??? potassium chloride (KLOR-CON M) 20 MEQ TBCR extended release tablet TAKE 1 TABLET BY MOUTH TWICE DAILY  2   ??? amylase-lipase-protease (CREON) 24000 UNITS per capsule Take 1 capsule by mouth     ??? mometasone (NASONEX) 50 MCG/ACT nasal spray one daily       No current facility-administered medications for this visit.        Social History     Socioeconomic History   ??? Marital status: Single     Spouse name: Not on file   ??? Number of children: Not on file   ??? Years of education: Not on file   ??? Highest education level: Not on file   Occupational History   ??? Not on file   Social Needs   ??? Financial resource strain: Not on file   ??? Food insecurity:     Worry: Not on file     Inability: Not on file   ??? Transportation needs:     Medical: Not on file     Non-medical: Not on file   Tobacco Use   ??? Smoking status: Former Smoker   ??? Smokeless tobacco: Never Used   ??? Tobacco comment: Pt quit smoking 5-10 yrs ago   Substance and Sexual Activity   ??? Alcohol use: No     Comment: occasional   ??? Drug use: No   ??? Sexual activity: Not on file   Lifestyle   ??? Physical activity:     Days per week:  Not on file     Minutes per session: Not on file   ??? Stress: Not on file   Relationships   ??? Social connections:     Talks on phone: Not on file     Gets together: Not on file     Attends religious service: Not on file     Active member of club or organization: Not on file     Attends meetings of clubs or organizations: Not on file     Relationship status: Not on file   ??? Intimate partner violence:     Fear of current or ex partner: Not on file     Emotionally abused: Not on file     Physically abused: Not on file     Forced sexual activity: Not on file   Other Topics Concern    ??? Not on file   Social History Narrative   ??? Not on file       Family History   Problem Relation Age of Onset   ??? High Blood Pressure Father        Allergies:  Allergies   Allergen Reactions   ??? Fentanyl And Related Itching   ??? Gabapentin Other (See Comments)     Spasms, swelling legs   ??? Levsin [Hyoscyamine Sulfate] Other (See Comments)     Disorientation   ??? Lyrica [Pregabalin]      Cough/wheezing   ??? Morphine Itching   ??? Other      All elixirs: stomach upset:   ??? Topamax [Topiramate]      dehydration       PCP: Gust Rung, MD    Electronically signedby Lindell Noe, MD on 10/29/2018 at 10:23 AM.

## 2018-10-29 NOTE — Progress Notes (Signed)
Review of Systems   Constitutional: Positive for unexpected weight change (per pt losing weight). Negative for chills and fever.   Eyes: Negative for visual disturbance.   Respiratory: Negative for cough and shortness of breath.    Cardiovascular: Negative for chest pain.   Gastrointestinal: Positive for abdominal pain, diarrhea, nausea and vomiting. Negative for blood in stool, constipation and rectal pain.   Genitourinary: Negative for difficulty urinating and dysuria.   Musculoskeletal: Positive for arthralgias. Negative for myalgias.   Skin: Negative for rash and wound.   Allergic/Immunologic: Positive for immunocompromised state (crohn's).   Neurological: Negative for dizziness, weakness and light-headedness.

## 2018-10-30 ENCOUNTER — Encounter: Payer: MEDICARE | Attending: Registered Nurse | Primary: Family Medicine

## 2018-11-03 DIAGNOSIS — D509 Iron deficiency anemia, unspecified: Secondary | ICD-10-CM

## 2018-11-06 ENCOUNTER — Ambulatory Visit: Admit: 2018-11-06 | Discharge: 2018-11-06 | Payer: MEDICARE | Attending: Registered Nurse | Primary: Family Medicine

## 2018-11-06 DIAGNOSIS — F331 Major depressive disorder, recurrent, moderate: Secondary | ICD-10-CM

## 2018-11-06 NOTE — Progress Notes (Signed)
Angel PouchMaria R Stevens is a 57 y.o. female  Subjective:        Chief Complaints:      2 m follow up depression, anxiety, stress          HPI:   Visit Duration: 3:10/3:40/30 min  Interim History: Pt present for 4456m f/u appt., established with this provider in Feb 2014. Pt is approx 30 min late and  walking with a cane today.  States she took bus to [place] and from there daughter drove her to [place] for Thanksgiving with pt's sister. Overall it was a nice holiday and they talked about some of their conflicts.  Pt states she is still having trouble with her legs-- was treated for cellulitis on her legs, antibiotic was not effective- realized it was not cellulitis- still not sure what it is.  Infection control is involved to determine cause.  Hx of major surgery in Feb r/t Crohn's disease pt described it as "a diaster".    Pt states she has a great team and they are still working on it and she may eventually have relief and is trying to keep hope.  Pt's pain was not resolved with the surgery but now is engaged in pain management and  pain meds are providing some relief.   Chronic health problems continue to be a road block for her mental health.  Twin sister Angel Stevens died last Oct.  Angel Stevens's daughter does not speak to pt.   Pt's daughters are still  in conflict and are not speaking, trying to stay out of it. Discussed [name]'s boys- Angel Stevens, Angel Stevens and [employer]ason. Pt and Mason do not get along.  Talked in length about this again as this is something that continues to bother pt, he has a different father and attends a different school and pt feels he gets special treatment.  Feels bad for [facility]- feels he gets picked on at school.  Pt has separated herself from him and has decided she will only visit when he is with other grandparents for a while.  This is a better alternative than conflict.  Daughter Vance PeperBoo still has the same boyfriend but due to his OCD - [name]'s son [name] 3036yr and her  2 girls have struggled. [name]. [name] is described as hateful and  moody, same as her boyfriend.   Offered much support and encouragement.  Pt reports previous increase in Duloxetine has helped with depressive symptoms some.  Taking Trazodone prn. Denies cardiac symptoms at present.   Will f/u in approx 2-3 mo. Denies SI HI     Target Signs/Symptoms:  anxiety, depression...             Gyn History: n/a          Medications:   Current Outpatient Medications   Medication Sig Dispense Refill   ??? potassium chloride (KLOR-CON M) 20 MEQ TBCR extended release tablet TAKE 1 TABLET BY MOUTH TWICE DAILY  2   ??? HYDROmorphone (DILAUDID) 4 MG tablet Take 4 mg by mouth every 3 hours as needed for Pain.     ??? methadone (DOLOPHINE) 10 MG tablet Take 10 mg by mouth every 4 hours as needed for Pain.     ??? sodium chloride 0.45 % infusion Infuse intravenously continuous     ??? Multiple Vitamin (MULTI-VITAMIN DAILY PO) Take by mouth     ??? traZODone (DESYREL) 100 MG tablet Take 2 tablets by mouth nightly 180 tablet 1   ??? DULoxetine (CYMBALTA) 60 MG extended release  capsule Take 1 capsule by mouth daily To be taken with 30mg  capsule to total 90 mg 90 capsule 1   ??? DULoxetine (CYMBALTA) 30 MG extended release capsule Take 1 capsule by mouth daily To be taken with the 60mg  to total 90 mg 90 capsule 1   ??? buPROPion (WELLBUTRIN SR) 100 MG extended release tablet Take 1 tablet by mouth 2 times daily 180 tablet 1   ??? amLODIPine (NORVASC) 2.5 MG tablet Take 2.5 mg by mouth     ??? loperamide (IMODIUM) 2 MG capsule      ??? estradiol (ESTRACE) 2 MG tablet      ??? ferric carboxymaltose (INJECTAFER) 750 MG/15ML SOLN IV solution Infuse 750 mg intravenously once Indications: every 3 months      ??? Probiotic Product (PROBIOTIC DAILY PO) Take by mouth     ??? cycloSPORINE (RESTASIS) 0.05 % ophthalmic emulsion Use 1 Drop in both eyes twice daily.     ??? dexlansoprazole (DEXILANT) 60 MG CPDR capsule Take by mouth     ??? Zoledronic Acid (RECLAST IV) Infuse intravenously Indications: Pt undure of dosage     ??? Diphenoxylate-Atropine  (LOMOTIL PO) Take by mouth     ??? Cyanocobalamin (VITAMIN B-12) 1000 MCG SUBL Place 1 tablet under the tongue     ??? dicyclomine (BENTYL) 10 MG capsule Take 20 mg by mouth every 8 hours     ??? hydrocortisone (PROCTOSOL HC) 2.5 % rectal cream Place rectally     ??? metoprolol (LOPRESSOR) 100 MG tablet Take 100 mg by mouth     ??? mometasone (NASONEX) 50 MCG/ACT nasal spray one daily     ??? promethazine (PHENERGAN) 25 MG tablet Take 25 mg by mouth     ??? sucralfate (CARAFATE) 1 GM/10ML suspension Take 1 g by mouth     ??? vitamin D (CHOLECALCIFEROL) 400 UNITS TABS tablet Take 400 Units by mouth daily     ??? amylase-lipase-protease (CREON) 24000 UNITS per capsule Take 1 capsule by mouth       No current facility-administered medications for this visit.              Allergies:   Allergies   Allergen Reactions   ??? Fentanyl And Related Itching   ??? Gabapentin Other (See Comments)     Spasms, swelling legs   ??? Levsin [Hyoscyamine Sulfate] Other (See Comments)     Disorientation   ??? Lyrica [Pregabalin]      Cough/wheezing   ??? Morphine Itching   ??? Other      All elixirs: stomach upset:   ??? Topamax [Topiramate]      dehydration            Objective:        Examination:   Mental Status Exam:   GENERAL OBSERVATIONS :.   Appearance appropriately dressed and healthy looking   Demeanor  cooperative  Activity normal.   Eye Contact good    SPEECH   Rate normal  Volume  appropriate  Articulation clear.   Coherent yes.   Spontaneous no.   MOOD & AFFECT :.   Depression  moderate  Anxiety moderate  Anger mild  Anhedonia  mild  Euphoria denies  Irritability mild  Affect appropriate  Associations logical.   Process Goal-Directed  THOUGHT CONTENT :.   Hallucinations No  Delusions. No  Suicidality no plan  Homicidality no plan   Judgement fair   Insight Good  Orientation time yes person yes situation yes place  yes.   Attention fair   Concentration fair   Memory Recent: intact Remote: intact.   Language Naming: intact Repetition: intact.   Condition:   not changed  Prognosis:  fair           Examination:   Mental Status Exam:                ASSESSMENT/PLAN:    Anushka was seen today for follow-up.    Diagnoses and all orders for this visit:    Major depressive disorder, recurrent episode, moderate ([name])          FOLLOW-UP:  6 mo      Encouraged self care, healthy lifestyle

## 2019-01-01 ENCOUNTER — Inpatient Hospital Stay: Admit: 2019-01-01 | Attending: Family Medicine | Primary: Family Medicine

## 2019-01-01 NOTE — Other (Unsigned)
Patient Acct Nbr: [id number]   Primary AUTH/CERT:   Primary Insurance Company Name: [employer]  Primary Insurance Plan name: Medicare A  Primary Insurance Group Number:   Primary Insurance Plan Type: [place] A  Primary Insurance Policy Number: [id number]    Secondary AUTH/CERT:   Secondary Insurance Company Name: [employer]  Secondary Insurance Plan name: Medicare B  Secondary Insurance Group Number:   Secondary Insurance Plan Type: Vick[employer] Insurance Policy Number: [id number]    Tertiary AUTH/CERT:   [employer] Name: [employer] Plan name: [employer] Group Number:   [employer] Plan Type: [employer] Number: [id number]

## 2019-01-07 ENCOUNTER — Ambulatory Visit: Admit: 2019-01-07 | Discharge: 2019-01-07 | Payer: MEDICARE | Attending: Registered Nurse | Primary: Family Medicine

## 2019-01-07 DIAGNOSIS — F331 Major depressive disorder, recurrent, moderate: Secondary | ICD-10-CM

## 2019-01-07 MED ORDER — DULOXETINE HCL 60 MG PO CPEP
60 MG | ORAL_CAPSULE | Freq: Every day | ORAL | 1 refills | Status: AC
Start: 2019-01-07 — End: ?

## 2019-01-07 MED ORDER — DULOXETINE HCL 30 MG PO CPEP
30 MG | ORAL_CAPSULE | Freq: Every day | ORAL | 1 refills | Status: AC
Start: 2019-01-07 — End: ?

## 2019-01-07 MED ORDER — BUPROPION HCL ER (SR) 100 MG PO TB12
100 MG | ORAL_TABLET | Freq: Two times a day (BID) | ORAL | 1 refills | Status: AC
Start: 2019-01-07 — End: ?

## 2019-01-07 MED ORDER — MIRTAZAPINE 15 MG PO TABS
15 MG | ORAL_TABLET | Freq: Every evening | ORAL | 0 refills | Status: AC
Start: 2019-01-07 — End: ?

## 2019-01-07 NOTE — Progress Notes (Signed)
Angel Stevens is a 58 y.o. female  Subjective:        Chief Complaints:      2 m follow up depression, anxiety, stress          HPI:   Visit Duration: 1:40/2:10/30 min  Interim History: Pt present for 63m f/u appt., established with this provider in Feb 2014. Pt is walking with a cane today.  Daughter Angel Stevens is planning to move to [facility] and pt would like to follow her and live down there with her.   Infection control is involved to determine cause.  Hx of major surgery last Feb r/t Crohn's disease pt described it as "a diaster".    Pt states she has a great team and they are still working on it and she may eventually have relief and is trying to keep hope.  Pt's pain was not resolved with the surgery but now is engaged in pain management and  pain meds are providing some relief.   Chronic health problems continue to be a road block for her mental health.  Twin sister Angel Stevens died last yr.  Angel Stevens's daughter does not speak to pt.   Pt's daughters are still  in conflict and are not speaking, trying to stay out of it. Discussed Angel Stevens- Angel Stevens, Angel Stevens and Angel Stevens. Pt and Mason do not get along.  Talked in length about this again as this is something that continues to bother pt, he has a different father and attends a different school and pt feels he gets special treatment.  Feels bad for [facility]- feels he gets picked on at school.  Pt has separated herself from him and has decided she will only visit when he is with other grandparents for a while.  This is a better alternative than conflict.  Daughter Angel Stevens still has the same boyfriend but due to his OCD - [name]'s son Angel Stevens 64yr and her  2 girls have struggled. Angel Stevens is described as hateful and moody, same as her boyfriend.   Offered much support and encouragement.  Pt reports previous increase in Duloxetine has helped with depressive symptoms some.  Taking Trazodone prn. States it is not working, plan to d/c and start trial of Mirtazapine.   Denies cardiac symptoms at present.    Will f/u in approx 2-3 mo. Denies SI HI     Target Signs/Symptoms:  anxiety, depression...             Gyn History: n/a          Medications:   Current Outpatient Medications   Medication Sig Dispense Refill   ??? potassium chloride (KLOR-CON M) 20 MEQ TBCR extended release tablet TAKE 1 TABLET BY MOUTH TWICE DAILY  2   ??? HYDROmorphone (DILAUDID) 4 MG tablet Take 4 mg by mouth every 3 hours as needed for Pain.     ??? methadone (DOLOPHINE) 10 MG tablet Take 10 mg by mouth every 4 hours as needed for Pain.     ??? sodium chloride 0.45 % infusion Infuse intravenously continuous     ??? Multiple Vitamin (MULTI-VITAMIN DAILY PO) Take by mouth     ??? traZODone (DESYREL) 100 MG tablet Take 2 tablets by mouth nightly 180 tablet 1   ??? DULoxetine (CYMBALTA) 60 MG extended release capsule Take 1 capsule by mouth daily To be taken with 30mg  capsule to total 90 mg 90 capsule 1   ??? DULoxetine (CYMBALTA) 30 MG extended release capsule Take 1 capsule by mouth  daily To be taken with the 60mg  to total 90 mg 90 capsule 1   ??? buPROPion (WELLBUTRIN SR) 100 MG extended release tablet Take 1 tablet by mouth 2 times daily 180 tablet 1   ??? amLODIPine (NORVASC) 2.5 MG tablet Take 2.5 mg by mouth     ??? loperamide (IMODIUM) 2 MG capsule      ??? estradiol (ESTRACE) 2 MG tablet      ??? ferric carboxymaltose (INJECTAFER) 750 MG/15ML SOLN IV solution Infuse 750 mg intravenously once Indications: every 3 months      ??? Probiotic Product (PROBIOTIC DAILY PO) Take by mouth     ??? cycloSPORINE (RESTASIS) 0.05 % ophthalmic emulsion Use 1 Drop in both eyes twice daily.     ??? dexlansoprazole (DEXILANT) 60 MG CPDR capsule Take by mouth     ??? Zoledronic Acid (RECLAST IV) Infuse intravenously Indications: Pt undure of dosage     ??? Diphenoxylate-Atropine (LOMOTIL PO) Take by mouth     ??? Cyanocobalamin (VITAMIN B-12) 1000 MCG SUBL Place 1 tablet under the tongue     ??? amylase-lipase-protease (CREON) 24000 UNITS per capsule Take 1 capsule by mouth     ??? dicyclomine  (BENTYL) 10 MG capsule Take 20 mg by mouth every 8 hours     ??? hydrocortisone (PROCTOSOL HC) 2.5 % rectal cream Place rectally     ??? metoprolol (LOPRESSOR) 100 MG tablet Take 100 mg by mouth     ??? mometasone (NASONEX) 50 MCG/ACT nasal spray one daily     ??? promethazine (PHENERGAN) 25 MG tablet Take 25 mg by mouth     ??? sucralfate (CARAFATE) 1 GM/10ML suspension Take 1 g by mouth     ??? vitamin D (CHOLECALCIFEROL) 400 UNITS TABS tablet Take 400 Units by mouth daily       No current facility-administered medications for this visit.              Allergies:   Allergies   Allergen Reactions   ??? Fentanyl And Related Itching   ??? Gabapentin Other (See Comments)     Spasms, swelling legs   ??? Levsin [Hyoscyamine Sulfate] Other (See Comments)     Disorientation   ??? Lyrica [Pregabalin]      Cough/wheezing   ??? Morphine Itching   ??? Other      All elixirs: stomach upset:   ??? Topamax [Topiramate]      dehydration            Objective:        Examination:   Mental Status Exam:   GENERAL OBSERVATIONS :.   Appearance appropriately dressed and healthy looking   Demeanor  cooperative  Activity normal.   Eye Contact good    SPEECH   Rate normal  Volume  appropriate  Articulation clear.   Coherent yes.   Spontaneous no.   MOOD & AFFECT :.   Depression  moderate  Anxiety moderate  Anger mild  Anhedonia  mild  Euphoria denies  Irritability mild  Affect appropriate  Associations logical.   Process Goal-Directed  THOUGHT CONTENT :.   Hallucinations No  Delusions. No  Suicidality no plan  Homicidality no plan   Judgement fair   Insight Good  Orientation time yes person yes situation yes place yes.   Attention fair   Concentration fair   Memory Recent: intact Remote: intact.   Language Naming: intact Repetition: intact.   Condition:  not changed  Prognosis:  fair  Examination:   Mental Status Exam:                ASSESSMENT/PLAN:    [name] was seen today for follow-up.    Diagnoses and all orders for this visit:    Major depressive  disorder, recurrent episode, moderate ([name])      Beck Depression Scale completed today. Completed form scanned. Score: 19    FOLLOW-UP:  6 mo      Encouraged self care, healthy lifestyle

## 2019-01-29 ENCOUNTER — Ambulatory Visit
Admit: 2019-01-29 | Discharge: 2019-01-29 | Payer: MEDICARE | Attending: Colon & Rectal Surgery | Primary: Family Medicine

## 2019-01-29 DIAGNOSIS — K50811 Crohn's disease of both small and large intestine with rectal bleeding: Secondary | ICD-10-CM

## 2019-01-29 NOTE — Progress Notes (Signed)
Assessment:      Diagnosis Orders   1. Crohn's disease of both small and large intestine with rectal bleeding ([name])     2. SGS (short gut syndrome)                 Plan:     1.  No further workup indicated at present  2.  Office visit in three months       Objective   History:     Angel Stevens is a 58 y.o. female who presents today for ongoing follow up.  She has not had to be hospitalized since last office visit.  She has had no episodes of line sepsis.  She continues to balance functional considerations as best as possible.  She is contemplating a move to [place] with one of her daughters.  Lifestyle considerations are discussed and optimization of factors considered.           Physical Exam:   BP (!) 140/80 (Site: Right Upper Arm, Position: Sitting, Cuff Size: Small Adult)    Pulse 64    Temp 98.9 ??F (37.2 ??C)    Ht  (1.727 m)    Wt 153 lb 3.2 oz (69.5 kg)    BMI 23.29 kg/m??     Physical Exam  Vitals signs and nursing note reviewed.   Constitutional:       Appearance: She is well-developed.   Pulmonary:      Effort: Pulmonary effort is normal. No respiratory distress.   Abdominal:      General: There is distension.      Palpations: Abdomen is soft.      Tenderness: There is abdominal tenderness.      Comments: Baseline abdominal exam.           ROS: Review of Systems as recordedby the medical assistant has been reviewed by me, and I agree except as noted inthe HPI.    Past Medical History:   Diagnosis Date   ??? Cellulitis    ??? Chronic abdominal pain    ??? Crohn disease ([name])    ??? Depression    ??? Hypertension    ??? Osteoarthritis    ??? Osteoporosis    ??? Short gut syndrome    ??? Ventral hernia        Past Surgical History:   Procedure Laterality Date   ??? ABDOMEN SURGERY     ??? COLONOSCOPY  10/08/2016    biopsies taken   ??? HYSTERECTOMY     ??? KNEE SURGERY     ??? LUNG SURGERY      mass removed from right side   ??? MYRINGOTOMY Right 11/04/2017       Current Outpatient Medications   Medication Sig Dispense  Refill   ??? mirtazapine (REMERON) 15 MG tablet Take 1 tablet by mouth nightly 30 tablet 0   ??? DULoxetine (CYMBALTA) 60 MG extended release capsule Take 1 capsule by mouth daily To be taken with  capsule to total 90 mg 90 capsule 1   ??? DULoxetine (CYMBALTA) 30 MG extended release capsule Take 1 capsule by mouth daily To be taken with the  to total 90 mg 90 capsule 1   ??? buPROPion (WELLBUTRIN SR) 100 MG extended release tablet Take 1 tablet by mouth 2 times daily 180 tablet 1   ??? potassium chloride (KLOR-CON M) 20 MEQ TBCR extended release tablet TAKE 1 TABLET BY MOUTH TWICE DAILY  2   ??? HYDROmorphone (DILAUDID)  4 MG tablet Take 4 mg by mouth every 3 hours as needed for Pain.     ??? methadone (DOLOPHINE) 10 MG tablet Take 10 mg by mouth every 4 hours as needed for Pain.     ??? sodium chloride 0.45 % infusion Infuse intravenously continuous     ??? Multiple Vitamin (MULTI-VITAMIN DAILY PO) Take by mouth     ??? amLODIPine (NORVASC) 2.5 MG tablet Take 2.5 mg by mouth     ??? loperamide (IMODIUM) 2 MG capsule      ??? estradiol (ESTRACE) 2 MG tablet      ??? ferric carboxymaltose (INJECTAFER) 750 MG/15ML SOLN IV solution Infuse 750 mg intravenously once Indications: every 3 months      ??? Probiotic Product (PROBIOTIC DAILY PO) Take by mouth     ??? cycloSPORINE (RESTASIS) 0.05 % ophthalmic emulsion Use 1 Drop in both eyes twice daily.     ??? dexlansoprazole (DEXILANT) 60 MG CPDR capsule Take by mouth     ??? Zoledronic Acid (RECLAST IV) Infuse intravenously Indications: Pt undure of dosage     ??? Diphenoxylate-Atropine (LOMOTIL PO) Take by mouth     ??? Cyanocobalamin (VITAMIN B-12) 1000 MCG SUBL Place 1 tablet under the tongue     ??? dicyclomine (BENTYL) 10 MG capsule Take 20 mg by mouth every 8 hours     ??? hydrocortisone (PROCTOSOL HC) 2.5 % rectal cream Place rectally     ??? metoprolol (LOPRESSOR) 100 MG tablet Take 100 mg by mouth     ??? mometasone (NASONEX) 50 MCG/ACT nasal spray one daily     ??? promethazine (PHENERGAN) 25 MG tablet  Take 25 mg by mouth     ??? sucralfate (CARAFATE) 1 GM/10ML suspension Take 1 g by mouth     ??? vitamin D (CHOLECALCIFEROL) 400 UNITS TABS tablet Take 400 Units by mouth daily     ??? amylase-lipase-protease (CREON) 24000 UNITS per capsule Take 1 capsule by mouth       No current facility-administered medications for this visit.        Social History     Socioeconomic History   ??? Marital status: Single     Spouse name: Not on file   ??? Number of children: Not on file   ??? Years of education: Not on file   ??? Highest education level: Not on file   Occupational History   ??? Not on file   Social Needs   ??? Financial resource strain: Not on file   ??? Food insecurity:     Worry: Not on file     Inability: Not on file   ??? Transportation needs:     Medical: Not on file     Non-medical: Not on file   Tobacco Use   ??? Smoking status: Former Smoker   ??? Smokeless tobacco: Never Used   ??? Tobacco comment: Pt quit smoking 5-10 yrs ago   Substance and Sexual Activity   ??? Alcohol use: No     Comment: occasional   ??? Drug use: No   ??? Sexual activity: Not on file   Lifestyle   ??? Physical activity:     Days per week: Not on file     Minutes per session: Not on file   ??? Stress: Not on file   Relationships   ??? Social connections:     Talks on phone: Not on file     Gets together: Not on file  Attends religious service: Not on file     Active member of club or organization: Not on file     Attends meetings of clubs or organizations: Not on file     Relationship status: Not on file   ??? Intimate partner violence:     Fear of current or ex partner: Not on file     Emotionally abused: Not on file     Physically abused: Not on file     Forced sexual activity: Not on file   Other Topics Concern   ??? Not on file   Social History Narrative   ??? Not on file       Family History   Problem Relation Age of Onset   ??? High Blood Pressure Father        Allergies:  Allergies   Allergen Reactions   ??? Fentanyl And Related Itching   ??? Gabapentin Other (See Comments)      Spasms, swelling legs   ??? Levsin [Hyoscyamine Sulfate] Other (See Comments)     Disorientation   ??? Lyrica [Pregabalin]      Cough/wheezing   ??? Morphine Itching   ??? Other      All elixirs: stomach upset:   ??? Topamax [Topiramate]      dehydration       PCP: Gust Rung, MD    Electronically signedby Lindell Noe, MD on 01/29/2019 at 1:11 PM.

## 2019-01-29 NOTE — Progress Notes (Signed)
Review of Systems   Constitutional: Negative for chills, fever and unexpected weight change.   Eyes: Positive for visual disturbance.   Respiratory: Negative for cough and shortness of breath.    Cardiovascular: Negative for chest pain.   Gastrointestinal: Positive for abdominal pain, diarrhea, nausea and vomiting. Negative for blood in stool, constipation and rectal pain.   Genitourinary: Negative for difficulty urinating and dysuria.   Musculoskeletal: Positive for arthralgias and myalgias.   Skin: Negative for rash and wound.   Allergic/Immunologic: Negative for immunocompromised state.   Neurological: Positive for weakness. Negative for dizziness and light-headedness.

## 2019-04-30 ENCOUNTER — Encounter: Attending: Colon & Rectal Surgery | Primary: Family Medicine

## 2019-08-04 ENCOUNTER — Ambulatory Visit
Admit: 2019-08-04 | Discharge: 2019-08-04 | Payer: MEDICARE | Attending: Colon & Rectal Surgery | Primary: Family Medicine

## 2019-08-04 DIAGNOSIS — K50811 Crohn's disease of both small and large intestine with rectal bleeding: Secondary | ICD-10-CM

## 2019-08-04 NOTE — Progress Notes (Signed)
Assessment:      Diagnosis Orders   1. Crohn's disease of both small and large intestine with rectal bleeding ([place])     2. SGS (short gut syndrome)     3.      Recurrent line infections  4.      Weight loss            Plan:     1.  No further workup indicated at present  2.  Attempting cefipime central line locks to decrease recurrent infection risk  3.  Patient may be moving to [place], [place] soon  4.  Office visit in 3 months       Objective   History:     Angel Stevens is a 58 y.o. female who presents today for ongoing follow up for Crohn's and short gut syndrome.  She had been free of central line infections for a quite a while but now has been admitted several times for this recently, the last time a month ago.  She is currently up and about and doing well, but has lost 20 lbs in the process.  She is in the process of moving to [place].  The timing of this is unclear at present.  She continues on TPN.  Greater than 51% of the 15 minute face to face encounter was spent discussing/counseling the patient regarding the care plan for this patient.             Physical Exam:   BP 110/66 (Site: Right Upper Arm, Position: Sitting, Cuff Size: Small Adult)    Pulse 76    Temp 98.1 ??F (36.7 ??C)    Ht 5\' 8"  (1.727 m)    Wt 132 lb (59.9 kg)    BMI 20.07 kg/m??     Physical Exam  Vitals signs and nursing note reviewed.   Constitutional:       General: She is not in acute distress.     Appearance: She is well-developed. She is not ill-appearing, toxic-appearing or diaphoretic.   HENT:      Head: Normocephalic and atraumatic.   Eyes:      General: No scleral icterus.     Conjunctiva/sclera: Conjunctivae normal.   Pulmonary:      Effort: Pulmonary effort is normal. No respiratory distress.   Abdominal:      General: There is no distension.      Palpations: Abdomen is soft.      Tenderness: There is no abdominal tenderness.   Skin:     General: Skin is warm and dry.   Neurological:      Mental Status: She is alert  and oriented to person, place, and time. Mental status is at baseline.   Psychiatric:         Mood and Affect: Mood normal.         Behavior: Behavior normal.         Thought Content: Thought content normal.         Judgment: Judgment normal.           ROS: Review of Systems as recordedby the medical assistant has been reviewed by me, and I agree except as noted inthe HPI.    Past Medical History:   Diagnosis Date   ??? Cellulitis    ??? Chronic abdominal pain    ??? Crohn disease ([place])    ??? Depression    ??? Hypertension    ??? Osteoarthritis    ??? Osteoporosis    ???  Short gut syndrome    ??? Ventral hernia        Past Surgical History:   Procedure Laterality Date   ??? ABDOMEN SURGERY     ??? COLONOSCOPY  10/08/2016    biopsies taken   ??? HYSTERECTOMY     ??? KNEE SURGERY     ??? LUNG SURGERY      mass removed from right side   ??? MYRINGOTOMY Right 11/04/2017       Current Outpatient Medications   Medication Sig Dispense Refill   ??? sulfamethoxazole-trimethoprim (BACTRIM DS;SEPTRA DS) 800-160 MG per tablet Take 2 tablets by mouth 2 times daily     ??? mirtazapine (REMERON) 15 MG tablet Take 1 tablet by mouth nightly 30 tablet 0   ??? DULoxetine (CYMBALTA) 60 MG extended release capsule Take 1 capsule by mouth daily To be taken with 30mg  capsule to total 90 mg 90 capsule 1   ??? DULoxetine (CYMBALTA) 30 MG extended release capsule Take 1 capsule by mouth daily To be taken with the 60mg  to total 90 mg 90 capsule 1   ??? buPROPion (WELLBUTRIN SR) 100 MG extended release tablet Take 1 tablet by mouth 2 times daily 180 tablet 1   ??? potassium chloride (KLOR-CON M) 20 MEQ TBCR extended release tablet TAKE 1 TABLET BY MOUTH TWICE DAILY  2   ??? HYDROmorphone (DILAUDID) 4 MG tablet Take 4 mg by mouth every 3 hours as needed for Pain.     ??? methadone (DOLOPHINE) 10 MG tablet Take 10 mg by mouth every 4 hours as needed for Pain.     ??? sodium chloride 0.45 % infusion Infuse intravenously continuous     ??? Multiple Vitamin (MULTI-VITAMIN DAILY PO) Take by mouth      ??? amLODIPine (NORVASC) 2.5 MG tablet Take 2.5 mg by mouth     ??? loperamide (IMODIUM) 2 MG capsule      ??? estradiol (ESTRACE) 2 MG tablet      ??? ferric carboxymaltose (INJECTAFER) 750 MG/15ML SOLN IV solution Infuse 750 mg intravenously once Indications: every 3 months      ??? Probiotic Product (PROBIOTIC DAILY PO) Take by mouth     ??? cycloSPORINE (RESTASIS) 0.05 % ophthalmic emulsion Use 1 Drop in both eyes twice daily.     ??? dexlansoprazole (DEXILANT) 60 MG CPDR capsule Take by mouth     ??? Zoledronic Acid (RECLAST IV) Infuse intravenously Indications: Pt undure of dosage     ??? Diphenoxylate-Atropine (LOMOTIL PO) Take by mouth     ??? Cyanocobalamin (VITAMIN B-12) 1000 MCG SUBL Place 1 tablet under the tongue     ??? dicyclomine (BENTYL) 10 MG capsule Take 20 mg by mouth every 8 hours     ??? hydrocortisone (PROCTOSOL HC) 2.5 % rectal cream Place rectally     ??? metoprolol (LOPRESSOR) 100 MG tablet Take 100 mg by mouth     ??? mometasone (NASONEX) 50 MCG/ACT nasal spray one daily     ??? promethazine (PHENERGAN) 25 MG tablet Take 25 mg by mouth     ??? sucralfate (CARAFATE) 1 GM/10ML suspension Take 1 g by mouth     ??? vitamin D (CHOLECALCIFEROL) 400 UNITS TABS tablet Take 400 Units by mouth daily     ??? amylase-lipase-protease (CREON) 24000 UNITS per capsule Take 1 capsule by mouth       No current facility-administered medications for this visit.        Social History  Socioeconomic History   ??? Marital status: Single     Spouse name: Not on file   ??? Number of children: Not on file   ??? Years of education: Not on file   ??? Highest education level: Not on file   Occupational History   ??? Not on file   Social Needs   ??? Financial resource strain: Not on file   ??? Food insecurity     Worry: Not on file     Inability: Not on file   ??? Transportation needs     Medical: Not on file     Non-medical: Not on file   Tobacco Use   ??? Smoking status: Former Smoker   ??? Smokeless tobacco: Never Used   ??? Tobacco comment: Pt quit smoking 5-10 yrs  ago   Substance and Sexual Activity   ??? Alcohol use: No     Comment: occasional   ??? Drug use: No   ??? Sexual activity: Not on file   Lifestyle   ??? Physical activity     Days per week: Not on file     Minutes per session: Not on file   ??? Stress: Not on file   Relationships   ??? Social Wellsite geologist on phone: Not on file     Gets together: Not on file     Attends religious service: Not on file     Active member of club or organization: Not on file     Attends meetings of clubs or organizations: Not on file     Relationship status: Not on file   ??? Intimate partner violence     Fear of current or ex partner: Not on file     Emotionally abused: Not on file     Physically abused: Not on file     Forced sexual activity: Not on file   Other Topics Concern   ??? Not on file   Social History Narrative   ??? Not on file       Family History   Problem Relation Age of Onset   ??? High Blood Pressure Father        Allergies:  Allergies   Allergen Reactions   ??? Fentanyl And Related Itching   ??? Gabapentin Other (See Comments)     Spasms, swelling legs   ??? Levsin [Hyoscyamine Sulfate] Other (See Comments)     Disorientation   ??? Lyrica [Pregabalin]      Cough/wheezing   ??? Morphine Itching   ??? Other      All elixirs: stomach upset:   ??? Topamax [Topiramate]      dehydration       PCP: Gust Rung, MD    Electronically signedby Lindell Noe, MD on 08/04/2019 at 4:04 PM.

## 2019-08-04 NOTE — Progress Notes (Signed)
Review of Systems   Constitutional: Positive for unexpected weight change (per patient losing weight ). Negative for chills and fever.   Eyes: Negative for visual disturbance.   Respiratory: Negative for cough and shortness of breath.    Cardiovascular: Negative for chest pain.   Gastrointestinal: Positive for diarrhea. Negative for abdominal pain, blood in stool, constipation, nausea, rectal pain and vomiting.   Genitourinary: Negative for difficulty urinating and dysuria.   Musculoskeletal: Positive for arthralgias. Negative for myalgias.   Skin: Negative for rash and wound.   Allergic/Immunologic: Positive for immunocompromised state (Crohn's).   Neurological: Negative for dizziness, weakness and light-headedness.

## 2019-08-27 DIAGNOSIS — B377 Candidal sepsis: Secondary | ICD-10-CM | POA: Insufficient documentation

## 2019-08-27 DIAGNOSIS — B49 Unspecified mycosis: Secondary | ICD-10-CM | POA: Insufficient documentation

## 2019-09-29 NOTE — Telephone Encounter (Signed)
Patient verbalized understanding of appointment date/time.

## 2019-09-29 NOTE — Telephone Encounter (Signed)
-----   Message from Archer Asa, RN sent at 09/28/2019  5:18 PM EST -----  Regarding: Appointment  Previously scheduled appointment moved to 10/01/19 @ 11:00 am with Dr. Kathryne Gin. Please contact patient with new appointment date & time.   ----- Message -----  From: Mickeal Skinner, MA  Sent: 09/28/2019  10:43 AM EST  To: Archer Asa, RN    Patient called stating that Dr. Kathryne Gin wanted to see patient for follow up before she moves. Per patient stating she is moving out this weekend, and is asking for an appointment for this week.

## 2019-10-01 ENCOUNTER — Ambulatory Visit
Admit: 2019-10-01 | Discharge: 2019-10-01 | Payer: MEDICARE | Attending: Colon & Rectal Surgery | Primary: Family Medicine

## 2019-10-01 DIAGNOSIS — K912 Postsurgical malabsorption, not elsewhere classified: Secondary | ICD-10-CM

## 2019-10-01 NOTE — Progress Notes (Signed)
Assessment:      Diagnosis Orders   1. SGS (short gut syndrome)     2. Crohn's disease of both small and large intestine with rectal bleeding ([place])                 Plan:     1.  Ongoing SBS/Crohn's care  2.  The patient is moving to be with family in [place]  3.  Follow up prn       Objective   History:     Angel Stevens is a 58 y.o. female who presents today for discussion of current treatment of her Crohn's disease and short gut syndrome.  She has been off of TPN for a month.  She is moving to [place] soon.  She currently has a left leg PICC line.  Greater than 51% of the 15 minute face to face encounter was spent discussing/counseling the patient as well as the care plan for this patient.         Physical Exam:   BP (!) 160/100 (Site: Right Upper Arm, Position: Sitting, Cuff Size: Small Adult)    Pulse 60    Temp 98.8 ??F (37.1 ??C)    Ht 5\' 8"  (1.727 m)    Wt 134 lb 3.2 oz (60.9 kg)    BMI 20.41 kg/m??     Physical Exam  Vitals signs and nursing note reviewed.   Constitutional:       Appearance: She is well-developed.   Pulmonary:      Effort: Pulmonary effort is normal. No respiratory distress.   Abdominal:      General: There is no distension.      Palpations: Abdomen is soft.      Tenderness: There is abdominal tenderness.           ROS: Review of Systems as recordedby the medical assistant has been reviewed by me, and I agree except as noted inthe HPI.    Past Medical History:   Diagnosis Date   ??? Cellulitis    ??? Chronic abdominal pain    ??? Crohn disease ([place])    ??? Depression    ??? Hypertension    ??? Osteoarthritis    ??? Osteoporosis    ??? Short gut syndrome    ??? Ventral hernia        Past Surgical History:   Procedure Laterality Date   ??? ABDOMEN SURGERY     ??? COLONOSCOPY  10/08/2016    biopsies taken   ??? HYSTERECTOMY     ??? KNEE SURGERY     ??? LUNG SURGERY      mass removed from right side   ??? MYRINGOTOMY Right 11/04/2017       Current Outpatient Medications   Medication Sig Dispense Refill   ???  mirtazapine (REMERON) 15 MG tablet Take 1 tablet by mouth nightly 30 tablet 0   ??? DULoxetine (CYMBALTA) 60 MG extended release capsule Take 1 capsule by mouth daily To be taken with 30mg  capsule to total 90 mg 90 capsule 1   ??? DULoxetine (CYMBALTA) 30 MG extended release capsule Take 1 capsule by mouth daily To be taken with the 60mg  to total 90 mg 90 capsule 1   ??? buPROPion (WELLBUTRIN SR) 100 MG extended release tablet Take 1 tablet by mouth 2 times daily 180 tablet 1   ??? potassium chloride (KLOR-CON M) 20 MEQ TBCR extended release tablet TAKE 1 TABLET BY MOUTH TWICE DAILY  2   ???  HYDROmorphone (DILAUDID) 4 MG tablet Take 4 mg by mouth every 3 hours as needed for Pain.     ??? methadone (DOLOPHINE) 10 MG tablet Take 10 mg by mouth every 4 hours as needed for Pain.     ??? sodium chloride 0.45 % infusion Infuse intravenously continuous     ??? Multiple Vitamin (MULTI-VITAMIN DAILY PO) Take by mouth     ??? amLODIPine (NORVASC) 2.5 MG tablet Take 2.5 mg by mouth     ??? loperamide (IMODIUM) 2 MG capsule      ??? estradiol (ESTRACE) 2 MG tablet      ??? ferric carboxymaltose (INJECTAFER) 750 MG/15ML SOLN IV solution Infuse 750 mg intravenously once Indications: every 3 months      ??? Probiotic Product (PROBIOTIC DAILY PO) Take by mouth     ??? cycloSPORINE (RESTASIS) 0.05 % ophthalmic emulsion Use 1 Drop in both eyes twice daily.     ??? dexlansoprazole (DEXILANT) 60 MG CPDR capsule Take by mouth     ??? Zoledronic Acid (RECLAST IV) Infuse intravenously Indications: Pt undure of dosage     ??? Diphenoxylate-Atropine (LOMOTIL PO) Take by mouth     ??? Cyanocobalamin (VITAMIN B-12) 1000 MCG SUBL Place 1 tablet under the tongue     ??? dicyclomine (BENTYL) 10 MG capsule Take 20 mg by mouth every 8 hours     ??? hydrocortisone (PROCTOSOL HC) 2.5 % rectal cream Place rectally     ??? metoprolol (LOPRESSOR) 100 MG tablet Take 100 mg by mouth     ??? mometasone (NASONEX) 50 MCG/ACT nasal spray one daily     ??? promethazine (PHENERGAN) 25 MG tablet Take 25 mg  by mouth     ??? sucralfate (CARAFATE) 1 GM/10ML suspension Take 1 g by mouth     ??? vitamin D (CHOLECALCIFEROL) 400 UNITS TABS tablet Take 400 Units by mouth daily     ??? amylase-lipase-protease (CREON) 24000 UNITS per capsule Take 1 capsule by mouth       No current facility-administered medications for this visit.        Social History     Socioeconomic History   ??? Marital status: Single     Spouse name: Not on file   ??? Number of children: Not on file   ??? Years of education: Not on file   ??? Highest education level: Not on file   Occupational History   ??? Not on file   Social Needs   ??? Financial resource strain: Not on file   ??? Food insecurity     Worry: Not on file     Inability: Not on file   ??? Transportation needs     Medical: Not on file     Non-medical: Not on file   Tobacco Use   ??? Smoking status: Former Smoker   ??? Smokeless tobacco: Never Used   ??? Tobacco comment: Pt quit smoking 5-10 yrs ago   Substance and Sexual Activity   ??? Alcohol use: No     Comment: occasional   ??? Drug use: No   ??? Sexual activity: Not on file   Lifestyle   ??? Physical activity     Days per week: Not on file     Minutes per session: Not on file   ??? Stress: Not on file   Relationships   ??? Social Wellsite geologist on phone: Not on file     Gets together: Not on file  Attends religious service: Not on file     Active member of club or organization: Not on file     Attends meetings of clubs or organizations: Not on file     Relationship status: Not on file   ??? Intimate partner violence     Fear of current or ex partner: Not on file     Emotionally abused: Not on file     Physically abused: Not on file     Forced sexual activity: Not on file   Other Topics Concern   ??? Not on file   Social History Narrative   ??? Not on file       Family History   Problem Relation Age of Onset   ??? High Blood Pressure Father        Allergies:  Allergies   Allergen Reactions   ??? Fentanyl And Related Itching   ??? Gabapentin Other (See Comments)     Spasms,  swelling legs   ??? Levsin [Hyoscyamine Sulfate] Other (See Comments)     Disorientation   ??? Lyrica [Pregabalin]      Cough/wheezing   ??? Morphine Itching   ??? Other      All elixirs: stomach upset:   ??? Topamax [Topiramate]      dehydration       PCP: Gust RungAnita K Amlani, MD    Electronically signedby Lindell NoeMichael Breckyn Ticas, MD on 10/01/2019 at 12:45 PM.

## 2019-10-01 NOTE — Progress Notes (Signed)
Review of Systems   Constitutional: Positive for fatigue. Negative for chills, fever and unexpected weight change.   Eyes: Negative for visual disturbance.   Respiratory: Negative for cough and shortness of breath.    Cardiovascular: Negative for chest pain.   Gastrointestinal: Positive for abdominal pain, diarrhea, nausea and vomiting. Negative for blood in stool, constipation and rectal pain.   Genitourinary: Negative for difficulty urinating and dysuria.   Musculoskeletal: Positive for arthralgias and myalgias.   Skin: Negative for rash and wound.   Allergic/Immunologic: Negative for immunocompromised state.   Neurological: Positive for dizziness, weakness and light-headedness.

## 2019-10-12 ENCOUNTER — Inpatient Hospital Stay (HOSPITAL_COMMUNITY)
Admission: EM | Admit: 2019-10-12 | Discharge: 2019-10-14 | DRG: 641 | Disposition: A | Discharge status: Home Health | Payer: Medicare Other | Attending: Internal Medicine | Admitting: Internal Medicine

## 2019-10-12 ENCOUNTER — Emergency Department (HOSPITAL_COMMUNITY): Payer: Medicare Other

## 2019-10-12 ENCOUNTER — Other Ambulatory Visit: Payer: Self-pay

## 2019-10-12 DIAGNOSIS — Z885 Allergy status to narcotic agent status: Secondary | ICD-10-CM

## 2019-10-12 DIAGNOSIS — E860 Dehydration: Secondary | ICD-10-CM | POA: Diagnosis present

## 2019-10-12 DIAGNOSIS — I10 Essential (primary) hypertension: Secondary | ICD-10-CM

## 2019-10-12 DIAGNOSIS — K50819 Crohn's disease of both small and large intestine with unspecified complications: Secondary | ICD-10-CM

## 2019-10-12 DIAGNOSIS — D649 Anemia, unspecified: Secondary | ICD-10-CM | POA: Diagnosis not present

## 2019-10-12 DIAGNOSIS — R197 Diarrhea, unspecified: Secondary | ICD-10-CM

## 2019-10-12 DIAGNOSIS — A498 Other bacterial infections of unspecified site: Secondary | ICD-10-CM | POA: Diagnosis present

## 2019-10-12 DIAGNOSIS — N179 Acute kidney failure, unspecified: Secondary | ICD-10-CM | POA: Diagnosis present

## 2019-10-12 DIAGNOSIS — R300 Dysuria: Secondary | ICD-10-CM

## 2019-10-12 DIAGNOSIS — R509 Fever, unspecified: Secondary | ICD-10-CM

## 2019-10-12 DIAGNOSIS — K50818 Crohn's disease of both small and large intestine with other complication: Secondary | ICD-10-CM

## 2019-10-12 DIAGNOSIS — K5010 Crohn's disease of large intestine without complications: Secondary | ICD-10-CM

## 2019-10-12 DIAGNOSIS — Z20828 Contact with and (suspected) exposure to other viral communicable diseases: Secondary | ICD-10-CM | POA: Diagnosis present

## 2019-10-12 DIAGNOSIS — I129 Hypertensive chronic kidney disease with stage 1 through stage 4 chronic kidney disease, or unspecified chronic kidney disease: Secondary | ICD-10-CM | POA: Diagnosis present

## 2019-10-12 DIAGNOSIS — K912 Postsurgical malabsorption, not elsewhere classified: Secondary | ICD-10-CM | POA: Diagnosis present

## 2019-10-12 DIAGNOSIS — E876 Hypokalemia: Secondary | ICD-10-CM | POA: Diagnosis not present

## 2019-10-12 DIAGNOSIS — Z884 Allergy status to anesthetic agent status: Secondary | ICD-10-CM

## 2019-10-12 DIAGNOSIS — G8929 Other chronic pain: Secondary | ICD-10-CM | POA: Diagnosis present

## 2019-10-12 DIAGNOSIS — K5090 Crohn's disease, unspecified, without complications: Secondary | ICD-10-CM | POA: Diagnosis present

## 2019-10-12 DIAGNOSIS — D509 Iron deficiency anemia, unspecified: Secondary | ICD-10-CM

## 2019-10-12 DIAGNOSIS — R600 Localized edema: Secondary | ICD-10-CM

## 2019-10-12 DIAGNOSIS — K529 Noninfective gastroenteritis and colitis, unspecified: Secondary | ICD-10-CM

## 2019-10-12 DIAGNOSIS — D631 Anemia in chronic kidney disease: Secondary | ICD-10-CM | POA: Diagnosis present

## 2019-10-12 DIAGNOSIS — N183 Chronic kidney disease, stage 3 unspecified: Secondary | ICD-10-CM | POA: Diagnosis present

## 2019-10-12 DIAGNOSIS — N390 Urinary tract infection, site not specified: Secondary | ICD-10-CM | POA: Diagnosis present

## 2019-10-12 LAB — COMPREHENSIVE METABOLIC PANEL
ALT: 43 U/L (ref 0–44)
AST: 27 U/L (ref 15–41)
Albumin: 2.7 g/dL — ABNORMAL LOW (ref 3.5–5.0)
Alkaline Phosphatase: 175 U/L — ABNORMAL HIGH (ref 38–126)
Anion gap: 12 (ref 5–15)
BUN: 20 mg/dL (ref 6–20)
CO2: 20 mmol/L — ABNORMAL LOW (ref 22–32)
Calcium: 8.5 mg/dL — ABNORMAL LOW (ref 8.9–10.3)
Chloride: 107 mmol/L (ref 98–111)
Creatinine, Ser: 1.75 mg/dL — ABNORMAL HIGH (ref 0.44–1.00)
GFR calc Af Amer: 37 mL/min — ABNORMAL LOW (ref >60)
GFR calc non Af Amer: 32 mL/min — ABNORMAL LOW (ref >60)
Glucose, Bld: 84 mg/dL (ref 70–99)
Potassium: 2.4 mmol/L — CL (ref 3.5–5.1)
Sodium: 139 mmol/L (ref 135–145)
Total Bilirubin: 1.2 mg/dL (ref 0.3–1.2)
Total Protein: 5.7 g/dL — ABNORMAL LOW (ref 6.5–8.1)

## 2019-10-12 LAB — BRAIN NATRIURETIC PEPTIDE: B Natriuretic Peptide: 253.9 pg/mL — ABNORMAL HIGH (ref 0.0–100.0)

## 2019-10-12 LAB — LACTIC ACID, PLASMA
Lactic Acid, Venous: 1 mmol/L (ref 0.5–1.9)
Lactic Acid, Venous: 0.9 mmol/L (ref 0.5–1.9)

## 2019-10-12 LAB — CBC
HCT: 24.7 % — ABNORMAL LOW (ref 36.0–46.0)
Hemoglobin: 8.1 g/dL — ABNORMAL LOW (ref 12.0–15.0)
MCH: 30.6 pg (ref 26.0–34.0)
MCHC: 32.8 g/dL (ref 30.0–36.0)
MCV: 93.2 fL (ref 80.0–100.0)
Platelets: 95 10*3/uL — ABNORMAL LOW (ref 150–400)
RBC: 2.65 MIL/uL — ABNORMAL LOW (ref 3.87–5.11)
RDW: 14.6 % (ref 11.5–15.5)
WBC: 9.8 10*3/uL (ref 4.0–10.5)
nRBC: 0 % (ref 0.0–0.2)

## 2019-10-12 LAB — LIPASE, BLOOD: Lipase: 19 U/L (ref 11–51)

## 2019-10-12 MED ORDER — HYDROMORPHONE HCL 1 MG/ML IJ SOLN
1.0000 mg | Freq: Once | INTRAMUSCULAR | Status: AC
  Administered 2019-10-12: 1 mg via INTRAVENOUS
  Filled 2019-10-12: qty 1

## 2019-10-12 MED ORDER — ACETAMINOPHEN 325 MG PO TABS
650.0000 mg | ORAL_TABLET | Freq: Four times a day (QID) | ORAL | Status: DC | PRN
  Administered 2019-10-14: 650 mg via ORAL
  Filled 2019-10-12 (×2): qty 2

## 2019-10-12 MED ORDER — SODIUM CHLORIDE 0.9 % IV SOLN: INTRAVENOUS | Status: DC | PRN

## 2019-10-12 MED ORDER — CHLORHEXIDINE GLUCONATE CLOTH 2 % EX PADS: 6.0000 | MEDICATED_PAD | Freq: Every day | CUTANEOUS | Status: DC

## 2019-10-12 MED ORDER — METOPROLOL TARTRATE 100 MG PO TABS
100.0000 mg | ORAL_TABLET | Freq: Two times a day (BID) | ORAL | Status: DC
  Administered 2019-10-13 – 2019-10-14 (×3): 100 mg via ORAL
  Filled 2019-10-12: qty 4
  Filled 2019-10-12 (×2): qty 1

## 2019-10-12 MED ORDER — AMLODIPINE BESYLATE 10 MG PO TABS
10.0000 mg | ORAL_TABLET | Freq: Every day | ORAL | Status: DC
  Administered 2019-10-13 – 2019-10-14 (×2): 10 mg via ORAL
  Filled 2019-10-12: qty 1
  Filled 2019-10-12: qty 2

## 2019-10-12 MED ORDER — POTASSIUM CHLORIDE 10 MEQ/100ML IV SOLN
10.0000 meq | INTRAVENOUS | Status: DC
  Administered 2019-10-12 (×2): 10 meq via INTRAVENOUS
  Filled 2019-10-12 (×2): qty 100

## 2019-10-12 MED ORDER — SULFAMETHOXAZOLE-TRIMETHOPRIM 800-160 MG PO TABS
1.0000 | ORAL_TABLET | Freq: Once | ORAL | Status: AC
  Administered 2019-10-12: 1 via ORAL
  Filled 2019-10-12: qty 1

## 2019-10-12 MED ORDER — ACETAMINOPHEN 325 MG PO TABS
650.0000 mg | ORAL_TABLET | Freq: Once | ORAL | Status: AC
  Administered 2019-10-12: 650 mg via ORAL
  Filled 2019-10-12: qty 2

## 2019-10-12 MED ORDER — SODIUM CHLORIDE 0.9% FLUSH: 10.0000 mL | Freq: Two times a day (BID) | INTRAVENOUS | Status: DC

## 2019-10-12 MED ORDER — SODIUM CHLORIDE 0.9% FLUSH: 10.0000 mL | INTRAVENOUS | Status: DC | PRN

## 2019-10-12 MED ORDER — ACETAMINOPHEN 650 MG RE SUPP: 650.0000 mg | Freq: Four times a day (QID) | RECTAL | Status: DC | PRN

## 2019-10-12 MED ORDER — ENOXAPARIN SODIUM 40 MG/0.4ML ~~LOC~~ SOLN
40.0000 mg | SUBCUTANEOUS | Status: DC
  Administered 2019-10-13 – 2019-10-14 (×2): 40 mg via SUBCUTANEOUS
  Filled 2019-10-12 (×2): qty 0.4

## 2019-10-12 MED ORDER — SODIUM CHLORIDE 0.9 % IV SOLN
INTRAVENOUS | Status: DC
  Administered 2019-10-12 – 2019-10-13 (×2): via INTRAVENOUS

## 2019-10-12 NOTE — Progress Notes (Signed)
Left thigh PICC assessed.  Appears WNL.  Great blood return and flushes well.

## 2019-10-12 NOTE — H&P (Signed)
History and Physical        Hospital Admission Note Date: 10/12/2019  Patient name: Caitlin Peters Medical record number: [id number] Date of birth: 14-Apr-1961 Age: 58 y.o. Gender: female  PCP: No primary care provider on file.    Patient coming from: home, recently moved from [place]   I have reviewed all records in the [facility].    Chief Complaint:  Abdominal Pain   HPI: Caitlin Peters is a 58 y.o. female with PMH significant for Crohn's Disease with multiple abdominal surgeries, chronic anemia requiring frequent transfusions, and lower extremity edema recently treated for cellulitis.   Patient moved here from [place] to live with her daughter. Has not yet established with PCP or GI. Patient recently admitted to [facility] in [place], [place] a few weeks ago. Reports last blood transfusion was in Sept. Admits to chronic anemia. 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 Normally takes 1L NS with Dextrose daily in her PICC line. She has not had this today.  Patient febrile today. Reports she has been febrile on and off and it always related to an infect in her abdomen or in her line. She denies COVID symptoms or exposure. Endorses some dysuria.   ED work-up/course:  Labs remarkable for hypokalemia to 2.4. Cr 1.7. HgB 8.1. No prior labs for comparison.  Chest Xray without cardiomegaly, pulmonary edema, infiltrates.  BNP elevated to 253.  Patient given Potassium 10 mEq x3 runs.  Given dose of Bactrim to treat for presumed UTI. UA not yet obtained.   Review of Systems: Positives marked in 'bold' Constitutional: Denies fever, chills, diaphoresis, poor appetite and fatigue.  HEENT: Denies photophobia, eye pain, redness, hearing loss, ear pain, congestion, sore throat, rhinorrhea, sneezing, mouth sores, trouble swallowing, neck pain, neck stiffness and tinnitus.   Respiratory: Denies SOB, DOE, cough,  chest tightness,  and wheezing.   Cardiovascular: Denies chest pain, palpitations and leg swelling.  Gastrointestinal: Denies nausea, vomiting, abdominal pain, diarrhea, constipation, blood in stool and abdominal distention.  Genitourinary: Denies dysuria, urgency, frequency, hematuria, flank pain and difficulty urinating.  Musculoskeletal: Denies myalgias, back pain, joint swelling, arthralgias and gait problem.  Skin: Denies pallor, rash and wound.  Neurological: Denies dizziness, seizures, syncope, weakness, light-headedness, numbness and headaches.  Hematological: Denies adenopathy. Easy bruising, personal or family bleeding history  Psychiatric/Behavioral: Denies suicidal ideation, mood changes, confusion, nervousness, sleep disturbance and agitation  Past Medical History: No past medical history on file.  Medications: Prior to Admission medications   Not on File    Allergies:   Allergies  Allergen Reactions   Fentanyl Rash   Morphine And Related Rash    Social History:  has no history on file for tobacco, alcohol, and drug.  Family History: No family history on file.  Physical Exam: Blood pressure (!) 144/76, pulse 73, temperature (!) 101.1 F (38.4 C), temperature source Oral, resp. rate 18, SpO2 96 %. General: Alert, awake, oriented x3, in no acute distress. Appears chronically ill and older than stated age.  Eyes: pink conjunctiva,anicteric sclera, pupils equal and reactive to light and accomodation, HEENT: normocephalic, atraumatic, oropharynx clear Neck: supple, no masses or lymphadenopathy,  no goiter, no bruits, no JVD CVS: Regular rate and rhythm, without murmurs, rubs or gallops. Negative Homan's Sign bilaterally. No calf tenderness or palpable cords.  Resp : Clear to auscultation bilaterally, no wheezing, rales or rhonchi. GI :Soft, non-distended. Multiple past surgical scars present. Generalized abdominal tenderness, no focal findings. No guarding or  rebound.  Musculoskeletal: No clubbing or cyanosis, positive pedal pulses. No contracture. ROM intact No CVA tenderness.  Neuro: Grossly intact, no focal neurological deficits, strength 5/5 upper and lower extremities bilaterally Psych: alert and oriented x 3, normal mood and affect Skin:1+ edema to bilateral LE to mid shin. Right calf with some skin discoloration and patches of erythema but no warmth or fluctuance. PICC line present to left anterior thigh without surrounding erythema, warmth, drainage or swelling.    LABS on Admission: I have personally reviewed all the labs and imagings below    Basic Metabolic Panel: Recent Labs  Lab 10/12/19 1703  NA 139  K 2.4*  CL 107  CO2 20*  GLUCOSE 84  BUN 20  CREATININE 1.75*  CALCIUM 8.5*   Liver Function Tests: Recent Labs  Lab 10/12/19 1703  AST 27  ALT 43  ALKPHOS 175*  BILITOT 1.2  PROT 5.7*  ALBUMIN 2.7*   Recent Labs  Lab 10/12/19 1703  LIPASE 19   No results for input(s): AMMONIA in the last 168 hours. CBC: Recent Labs  Lab 10/12/19 1703  WBC 9.8  HGB 8.1*  HCT 24.7*  MCV 93.2  PLT 95*   Cardiac Enzymes: No results for input(s): CKTOTAL, CKMB, CKMBINDEX, TROPONINI in the last 168 hours. BNP: Invalid input(s): POCBNP CBG: No results for input(s): GLUCAP in the last 168 hours.  Radiological Exams on Admission:  Ct Abdomen Pelvis Wo Contrast  Result Date: 10/12/2019 CLINICAL DATA:  58 year old female with abdominal pain, fever and lower extremity swelling. Crohn disease EXAM: CT ABDOMEN AND PELVIS WITHOUT CONTRAST TECHNIQUE: Multidetector CT imaging of the abdomen and pelvis was performed following the standard protocol without IV contrast. COMPARISON:  Chest radiographs earlier today. FINDINGS: Lower chest: Minor costophrenic angle atelectasis or scarring. Small surgical clips along the anterior right costophrenic angle. Cardiac size at the upper limits of normal. No pericardial or pleural effusion.  Hepatobiliary: Gallbladder seems to be surgically absent with dilated hepatic ducts at the porta hepatis and CBD approximately 11-12 millimeters diameter. Mild additional intrahepatic biliary ductal dilatation suspected. No discrete liver lesion in the absence of contrast. Pancreas: Poorly delineated in the absence of contrast, probably atrophied. Spleen: Negative noncontrast appearance. Adrenals/Urinary Tract: No adrenal gland enlargement identified. No hydronephrosis, perinephric stranding or nephrolithiasis. Mildly distended but otherwise unremarkable urinary bladder. Stomach/Bowel: Negative rectum. Sigmoid and descending colon are mildly distended with a mix of stool and fluid. Gas and fluid at the splenic flexure. Surgical clips in the left upper quadrant appear to be related to previous right hemicolectomy with a small bowel anastomosis to the distal transverse colon. But regional small bowel loops are poorly delineated in the absence of contrast. Numerous additional a bowel staples and surgical clips elsewhere in the mid and right abdomen where fluid-filled small bowel loops are at the upper limits of normal for size. The stomach seems to be decompressed. The duodenum is difficult to delineate. No free air.  No free fluid identified. Vascular/Lymphatic: Vascular patency is not evaluated in the absence of IV contrast. There is a catheter like device tunneled in the left proximal anterior thigh subcutaneous fat (series 3, image 85), which  courses in the left iliac vein to the confluence with the IVC. Mild Calcified aortic atherosclerosis. No lymphadenopathy is evident. Reproductive: The uterus appears to be surgically absent. Ovaries are diminutive or absent. Other: No pelvic free fluid. Musculoskeletal: Osteopenia. Mild lumbar scoliosis. Advanced lower lumbar facet degeneration. No acute osseous abnormality identified. IMPRESSION: 1. Extensive postoperative changes to bowel, with evidence of prior  hemicolectomy and left upper quadrant small to large bowel anastomosis. No bowel inflammation is evident in the absence of contrast. 2. Gallbladder seems to be surgically absent with intra-and extrahepatic biliary ductal enlargement. Correlate for hyperbilirubinemia. 3. No urinary calculus or obstructive uropathy. 4. Tunneled left femoral vein approach central venous catheter is in place. Electronically Signed   By: Genevie Ann M.D.   On: 10/12/2019 22:34   Dg Chest 2 View  Result Date: 10/12/2019 CLINICAL DATA:  Leg swelling EXAM: CHEST - 2 VIEW COMPARISON:  None. FINDINGS: Heart and mediastinal contours are within normal limits. No focal opacities or effusions. No acute bony abnormality. IMPRESSION: No active cardiopulmonary disease. Electronically Signed   By: Rolm Baptise M.D.   On: 10/12/2019 21:17      EKG: Independently reviewed. NSR. No ST elevation.    Assessment/Plan Hypokalemia due to excessive gastrointestinal loss of potassium Chronic diarrhea Crohn's disease ([place]) Patient with extensive GI history. Reports 7 abdominal surgeries for h/o Crohn's disease. She is on medications for this disease but did not bring her medication list with her. ED provider completed record release request from [facility]. Found to have hypokalemia to 2.4. Completed 3 runs of 10 mEq of potassium. Diarrhea has increased over the past two weeks likely resulting in this lab abnormality. While abdomen is diffusely tender, abdominal exam is otherwise benign. CT abdomen without an acute findings and no bowel inflammation present. Extensive postoperative changes noted. Does not appear to have an acute intraabdominal infection.  -admit to observation, telemetry due to hypokalemia  -repeat BMET in AM, replete K as needed  -obtain Mg given hypokalemia  -restart home medications one Rx list obtained  -will likely need GI consult inpatient vs. Establish care outpatient  -C diff pending  -GI panel pending       Fever of Unknown Origin  Patient febrile to 101.1 on arrival. Vital signs otherwise unremarkable. No leukocytosis present. Lactic acid normal. CXR without any signs PNA. Patient denies COVID-19 symptoms and exposure. Endorses mild dysuria. No evidence of intraabdominal infection on CT abdomen. No evidence of cellulitis around PICC line or to LE. Patient reports she has had intermittent fevers and providers in [place] unable to determine etiology. Received Bactrim x1 for empiric treatment of UTI. Does not meet criteria for sepsis.  -continue to monitor temperatures and vital signs  -COVID-19 test pending  -C diff pending  -blood cultures pending  -GI panel pending    AKI  Cr 1.75 at admission. Patient denies history of renal failure. No baseline lab available for comparison. Likely related to dehydration from gastrointestinal losses. No urinary calculus or obstructive uropathy present on CT abdomen. Patient received IVF bolus in ED.  -repeat BMET in AM  -IVF hydration overnight at 125 cc/hr -encouraged PO intake   Dysuria Endorses mild dysuria. Received Bactrim in ED x1.  -UA pending, will also send for culture  -hold off on further antibiotic therapy until further work up obtained     Bilateral lower extremity edema  Patient with 1+ LE edema to shins bilaterally. Reports was treated for cellulitis to LE a few  weeks ago. No evidence of cellulitis on exam; appears more like chronic vascular changes. No evidence of DVT on exam. BNP elevated to 253.9. However, patient reports normal echo recently in [place]. No evidence of cardiomegaly or pulmonary edema on CXR. No symptoms of heart failure exacerbation. May be elevated further in setting of AKI.  -await records for echo result   Anemia HgB 8.1 Normocytic. Per patient, chronic anemia with history of multiple transfusions. No comparison for baseline. Denies hematochezia or melena. Patient asymptomatic.  -repeat CBC in AM      Hypertension -continue home Metoprolol 100 mg BID and Norvasc 10 mg qd      DVT prophylaxis: Lovenox   CODE STATUS: Full   Consults called: None   Family Communication: Admission, patients condition and plan of care including tests being ordered have been discussed with the patient who indicates understanding and agree with the plan and Code Status  Admission status:   The medical decision making on this patient was of high complexity and the patient is at high risk for clinical deterioration, therefore this is a level 3 admission.  Severity of Illness:     Moderate  The appropriate patient status for this patient is OBSERVATION. Observation status is judged to be reasonable and necessary in order to provide the required intensity of service to ensure the patient's safety. [name] presenting symptoms, physical exam findings, and initial radiographic and laboratory data in the context of their medical condition is felt to place them at decreased risk for further clinical deterioration. Furthermore, it is anticipated that the patient will be medically stable for discharge from the hospital within 2 midnights of admission. The following factors support the patient status of observation.   " [name] presenting symptoms include abdominal pain, lower extremity edmea. " The physical exam findings include diffuse abdominal tenderness, 1+ pitting edema to LE. " The initial radiographic and laboratory data are elevated BNP, unremarkable CXR and CT abdomen, hypokalemia to 2.4.     Time Spent on Admission: 54 minutes      Melina Schools M.D. [facility] [phone] 10/12/2019, 11:40 PM

## 2019-10-12 NOTE — ED Triage Notes (Signed)
Pt endorses crohns disease, has abd pain and leg swelling. Just moved here from [place] and has not gotten a pcp yet. Febrile.

## 2019-10-12 NOTE — ED Notes (Signed)
Patient transported to CT 

## 2019-10-12 NOTE — ED Notes (Signed)
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  She is currently transitioning from [place] to live here w/ her daughter.  She has had 7 surgeries on her stomach, has had a recent blood transfusion.  She has a PICC line in her leg that was placed in [place].  Provider requested medical records be obtained from [facility] [place] in [place].  Pt signed release of information

## 2019-10-12 NOTE — ED Provider Notes (Addendum)
[facility] EMERGENCY DEPARTMENT Provider Note   CSN: [id number] Arrival date & time: 10/12/19  1630    History   Chief Complaint Chief Complaint  Patient presents with   Abdominal Pain   HPI Caitlin Peters is a 58 y.o. female with medical history significant for Crohn's disease, anemia requiring frequent transfusions, swelling of an unknown etiology who presents for evaluation of multiple complaints.  Patient states she was admitted to [facility] [place] [facility] at the end of October for similar complaints.  Patient states she has had "all over body swelling."  She denies prior history of CHF.  Patient states originally they thought her lower extremity edema and erythema with cellulitis however patient states they told on her last admission that she did not have any evidence of cellulitis.  Patient states she has been running intermittent fevers at home up to 102.0.  She has had cramping abdominal pain as well as diarrhea without melena or hematochezia.  States she takes home Dilaudid as well as methadone for her chronic pain.  Patient states she has a PICC line to her left anterior thigh.  Patient states she has had 4 different IV accesses 3 of which have been removed since the summer.  Patient states she recently moved here to be with her daughter and has not established GI or ECP follow-up.  She denies recent Covid exposures.  She states she does have some mild burning with urination.  She has been tolerating p.o. intake at home without difficulty.  She states she does have "all over pain."  Denies headache, neck stiffness, neck rigidity, congestion, rhinorrhea, sore throat, chest pain, shortness of breath, hemoptysis, unilateral weakness.  Denies additional aggravating or alleviating factors.  She states she did recently have a blood transfusion approximately 2 to 3 weeks ago.  Admits to chronic anemia. 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 Normally  takes 1L NS with Dextrose daily in her PICC line. She has not had this today.  History obtained from patient and past medical records.  No interpreter is used.     HPI  No past medical history on file.  There are no active problems to display for this patient.  Histories reviewed.   OB History   No obstetric history on file.      Home Medications    Prior to Admission medications   Not on File    Family History No family history on file.  Social History Social History   Tobacco Use   Smoking status: Not on file  Substance Use Topics   Alcohol use: Not on file   Drug use: Not on file     Allergies   Fentanyl and Morphine and related   Review of Systems Review of Systems  Constitutional: Positive for activity change and fever. Negative for appetite change, chills, diaphoresis and fatigue.  HENT: Negative.   Respiratory: Negative.   Cardiovascular: Positive for leg swelling. Negative for chest pain and palpitations.  Gastrointestinal: Positive for abdominal pain, diarrhea and nausea. Negative for abdominal distention, anal bleeding, blood in stool, constipation, rectal pain and vomiting.  Genitourinary: Positive for dysuria. Negative for decreased urine volume, difficulty urinating, flank pain, frequency, hematuria, pelvic pain and urgency.  Musculoskeletal: Negative.   Skin: Negative.   Neurological: Negative.   All other systems reviewed and are negative.  Physical Exam Updated Vital Signs BP (!) 144/76    Pulse 73    Temp (!) 101.1 F (38.4  C) (Oral)    Resp 18    SpO2 96%   Physical Exam Vitals signs and nursing note reviewed.  Constitutional:      General: She is not in acute distress.    Appearance: She is well-developed. She is not toxic-appearing. Ill appearance: Chronically ill appearing, appears older than stated age.  HENT:     Head: Normocephalic and atraumatic.     Mouth/Throat:     Mouth: Mucous membranes are moist.  Eyes:      Pupils: Pupils are equal, round, and reactive to light.  Neck:     Musculoskeletal: Normal range of motion.  Cardiovascular:     Rate and Rhythm: Normal rate.     Pulses:          Dorsalis pedis pulses are 2+ on the right side and 2+ on the left side.       Posterior tibial pulses are 2+ on the right side and 2+ on the left side.     Heart sounds: Normal heart sounds.  Pulmonary:     Effort: Pulmonary effort is normal. No respiratory distress.     Breath sounds: Normal breath sounds.  Abdominal:     General: There is no distension.     Palpations: Abdomen is soft.     Tenderness: There is generalized abdominal tenderness. There is no right CVA tenderness, left CVA tenderness, guarding or rebound.     Hernia: No hernia is present.     Comments: Soft, generalized tenderness to palpation.  No rebound or guarding.  Multiple, old abdominal wall skin incisions. No evidence of infectious process.  Musculoskeletal: Normal range of motion.     Comments: 1+ pitting edema to bilateral lower extremities to mid shin. There is mild Brawning of skin to mid calf however no warmth, fluctuance or induration. PIC line to anterior right thigh, no surrounding warmth, drainage, swelling. Compartments soft. Homans sign negative without calf tenderness, warmth.  Feet:     Right foot:     Skin integrity: Erythema present. No warmth.     Toenail Condition: Right toenails are abnormally thick.     Left foot:     Skin integrity: Erythema present. No warmth.     Toenail Condition: Left toenails are abnormally thick.  Skin:    General: Skin is warm and dry.     Capillary Refill: Capillary refill takes less than 2 seconds.     Comments: 1+ pitting edema to lower extremities to mid chin. No warmth  Neurological:     Mental Status: She is alert.     Comments: Intact sensation    ED Treatments / Results  Labs (all labs ordered are listed, but only abnormal results are displayed) Labs Reviewed  COMPREHENSIVE  METABOLIC PANEL - Abnormal; Notable for the following components:      Result Value   Potassium 2.4 (*)    CO2 20 (*)    Creatinine, Ser 1.75 (*)    Calcium 8.5 (*)    Total Protein 5.7 (*)    Albumin 2.7 (*)    Alkaline Phosphatase 175 (*)    GFR calc non Af Amer 32 (*)    GFR calc Af Amer 37 (*)    All other components within normal limits  CBC - Abnormal; Notable for the following components:   RBC 2.65 (*)    Hemoglobin 8.1 (*)    HCT 24.7 (*)    Platelets 95 (*)    All other components within  normal limits  BRAIN NATRIURETIC PEPTIDE - Abnormal; Notable for the following components:   B Natriuretic Peptide 253.9 (*)    All other components within normal limits  CULTURE, BLOOD (ROUTINE X 2)  CULTURE, BLOOD (ROUTINE X 2)  GASTROINTESTINAL PANEL BY PCR, STOOL (REPLACES STOOL CULTURE)  C DIFFICILE QUICK SCREEN W PCR REFLEX  SARS CORONAVIRUS 2 (TAT 6-24 HRS)  LIPASE, BLOOD  LACTIC ACID, PLASMA  LACTIC ACID, PLASMA  URINALYSIS, ROUTINE W REFLEX MICROSCOPIC  I-STAT BETA HCG BLOOD, ED ([facility], [facility], [facility] ONLY)    EKG EKG Interpretation  Date/Time:  Monday October 12 2019 21:35:15 EST Ventricular Rate:  70 PR Interval:  174 QRS Duration: 96 QT Interval:  430 QTC Calculation: 464 R Axis:   73 Text Interpretation: Normal sinus rhythm Nonspecific ST abnormality Abnormal ECG Confirmed by Veryl Speak [phone]) on 10/12/2019 10:07:56 PM   Radiology Ct Abdomen Pelvis Wo Contrast  Result Date: 10/12/2019 CLINICAL DATA:  58 year old female with abdominal pain, fever and lower extremity swelling. Crohn disease EXAM: CT ABDOMEN AND PELVIS WITHOUT CONTRAST TECHNIQUE: Multidetector CT imaging of the abdomen and pelvis was performed following the standard protocol without IV contrast. COMPARISON:  Chest radiographs earlier today. FINDINGS: Lower chest: Minor costophrenic angle atelectasis or scarring. Small surgical clips along the anterior right costophrenic angle. Cardiac size at the  upper limits of normal. No pericardial or pleural effusion. Hepatobiliary: Gallbladder seems to be surgically absent with dilated hepatic ducts at the porta hepatis and CBD approximately 11-12 millimeters diameter. Mild additional intrahepatic biliary ductal dilatation suspected. No discrete liver lesion in the absence of contrast. Pancreas: Poorly delineated in the absence of contrast, probably atrophied. Spleen: Negative noncontrast appearance. Adrenals/Urinary Tract: No adrenal gland enlargement identified. No hydronephrosis, perinephric stranding or nephrolithiasis. Mildly distended but otherwise unremarkable urinary bladder. Stomach/Bowel: Negative rectum. Sigmoid and descending colon are mildly distended with a mix of stool and fluid. Gas and fluid at the splenic flexure. Surgical clips in the left upper quadrant appear to be related to previous right hemicolectomy with a small bowel anastomosis to the distal transverse colon. But regional small bowel loops are poorly delineated in the absence of contrast. Numerous additional a bowel staples and surgical clips elsewhere in the mid and right abdomen where fluid-filled small bowel loops are at the upper limits of normal for size. The stomach seems to be decompressed. The duodenum is difficult to delineate. No free air.  No free fluid identified. Vascular/Lymphatic: Vascular patency is not evaluated in the absence of IV contrast. There is a catheter like device tunneled in the left proximal anterior thigh subcutaneous fat (series 3, image 85), which courses in the left iliac vein to the confluence with the IVC. Mild Calcified aortic atherosclerosis. No lymphadenopathy is evident. Reproductive: The uterus appears to be surgically absent. Ovaries are diminutive or absent. Other: No pelvic free fluid. Musculoskeletal: Osteopenia. Mild lumbar scoliosis. Advanced lower lumbar facet degeneration. No acute osseous abnormality identified. IMPRESSION: 1. Extensive  postoperative changes to bowel, with evidence of prior hemicolectomy and left upper quadrant small to large bowel anastomosis. No bowel inflammation is evident in the absence of contrast. 2. Gallbladder seems to be surgically absent with intra-and extrahepatic biliary ductal enlargement. Correlate for hyperbilirubinemia. 3. No urinary calculus or obstructive uropathy. 4. Tunneled left femoral vein approach central venous catheter is in place. Electronically Signed   By: Genevie Ann M.D.   On: 10/12/2019 22:34   Dg Chest 2 View  Result Date: 10/12/2019 CLINICAL DATA:  Leg swelling EXAM: CHEST - 2 VIEW COMPARISON:  None. FINDINGS: Heart and mediastinal contours are within normal limits. No focal opacities or effusions. No acute bony abnormality. IMPRESSION: No active cardiopulmonary disease. Electronically Signed   By: Rolm Baptise M.D.   On: 10/12/2019 21:17    Procedures .Critical Care Performed by: Nettie Elm, PA-C Authorized by: Nettie Elm, PA-C   Critical care provider statement:    Critical care time (minutes):  35   Critical care was necessary to treat or prevent imminent or life-threatening deterioration of the following conditions:  Metabolic crisis (Severe hypokalemia with EKG changes)   Critical care was time spent personally by me on the following activities:  Discussions with consultants, evaluation of patient's response to treatment, examination of patient, ordering and performing treatments and interventions, ordering and review of laboratory studies, ordering and review of radiographic studies, pulse oximetry, re-evaluation of patient's condition, obtaining history from patient or surrogate and review of old charts   (including critical care time)  Medications Ordered in ED Medications  potassium chloride 10 mEq in 100 mL IVPB (10 mEq Intravenous New Bag/Given 10/12/19 2154)  0.9 %  sodium chloride infusion (has no administration in time range)  sodium chloride flush  (NS) 0.9 % injection 10-40 mL (has no administration in time range)  sodium chloride flush (NS) 0.9 % injection 10-40 mL (has no administration in time range)  Chlorhexidine Gluconate Cloth 2 % PADS 6 each (has no administration in time range)  sulfamethoxazole-trimethoprim (BACTRIM DS) 800-160 MG per tablet 1 tablet (has no administration in time range)  acetaminophen (TYLENOL) tablet 650 mg (650 mg Oral Given 10/12/19 2206)  HYDROmorphone (DILAUDID) injection 1 mg (1 mg Intravenous Given 10/12/19 2129)  HYDROmorphone (DILAUDID) injection 1 mg (1 mg Intravenous Given 10/12/19 2239)   Initial Impression / Assessment and Plan / ED Course  I have reviewed the triage vital signs and the nursing notes.  Pertinent labs & imaging results that were available during my care of the patient were reviewed by me and considered in my medical decision making (see chart for details).  58 year old female appears chronically ill with complicated medical history who presents for evaluation of multiple complaints.  Resident of [place] however recently moved out to be with her daughter here in [place].  She has not established PCP or GI follow-up.  Patient is to multiple hospitalizations, most recently approximately 3 to 4 weeks ago.  Patient with chronic edema without proper CHF diagnosis.  Originally thought to be cellulitis however patient states on her last admission they told her this was not cellulitis.  She admits to cramping abdominal pain, diarrhea.  She gets multiple blood transfusions.  She has a PICC line to her left anterior thigh without any evidence of overlying infectious process.  Her abdomen is soft however diffusely tender to palpation.  She denies melena or hematochezia.  Does have 1+ pitting edema to her lower extremities to her mid shin.  There is mild broadening of skin however compartments are soft.  She is neurovascularly intact.  This does not appear to be infectious.  Denies any recent Covid  exposures.  States she is seen by pain clinic in [place] and takes methadone and Dilaudid daily for her chronic pain.  Has history of hypokalemia requiring IV potassium secondary to short gut syndrome from her multiple abdominal surgeries.  Is tolerating p.o. intake at home.  Has been running fevers up to 102 at home.  She does not  appear septic.  Initial labs obtained from triage. Nursing unable to get 2 set of blood cultures. Patient refusing additional attempt at blood cultures at this time.  Labs and imaging personally reviewed CBC without leukocytosis, hemoglobin 8.1, platelets 95--no previous to compare, patient denies melena, hematochezia however admits to chronic anemia requiring blood transfusion.  She denies any dizziness or lightheadedness. Metabolic panel with hypokalemia at 2.4, creatinine 1.75, GFR 37, alk phos 175 DG chest without cardiomegaly, pulmonary edema, pneumothorax,infiltrates Lactic acid 0.9 Lipase 19 BNP elevated at 253, no pulmonary edema, CP, SOB, no evidence of heart failure  1040: Patient with continued "all over pain." Does not appear septic and no leukocytosis. No evidence of DVT ion exam however cannot completely rule out given no [place] here tonight. No CP, SOB low suspicion PE. She does admit to prior cellulitis, urinary symptoms and fever diarrhea. Will given Oral bactrim to cover. Will need admission for symptomatic hypokalemia. No evidence of sepsis or sirs. Patient refusing cath for urine at this time.  I have attempted to get medical records from prior hospitals without success.  2300: Consult with Dr. Juleen China with [facility] who will evaluate patient for admission.  The patient appears reasonably stabilized for admission considering the current resources, flow, and capabilities available in the ED at this time, and I doubt any other [facility] requiring further screening and/or treatment in the ED prior to admission.  Discussed with attending who is in agreement with above  treatment, plan and disposition.       Caitlin Peters was evaluated in Emergency Department on 10/12/2019 for the symptoms described in the history of present illness. She was evaluated in the context of the global COVID-19 pandemic, which necessitated consideration that the patient might be at risk for infection with the SARS-CoV-2 virus that causes COVID-19. Institutional protocols and algorithms that pertain to the evaluation of patients at risk for COVID-19 are in a state of rapid change based on information released by regulatory bodies including the CDC and federal and state organizations. These policies and algorithms were followed during the patient's care in the ED.   10/29/19 1825: ADDENDUM to add Critical Care    Final Clinical Impressions(s) / ED Diagnoses   Final diagnoses:  Diarrhea, unspecified type  Fever, unspecified fever cause  Hypokalemia  Anemia, unspecified type    ED Discharge Orders    None       Lourene Hoston A, PA-C 10/12/19 2306    Veryl Speak, MD 10/12/19 2323    Othel Hoogendoorn A, PA-C 10/29/19 1825    Veryl Speak, MD 11/02/19 2307

## 2019-10-12 NOTE — ED Notes (Addendum)
Patient transported to X-ray 

## 2019-10-13 DIAGNOSIS — D631 Anemia in chronic kidney disease: Secondary | ICD-10-CM | POA: Diagnosis present

## 2019-10-13 DIAGNOSIS — K912 Postsurgical malabsorption, not elsewhere classified: Secondary | ICD-10-CM | POA: Diagnosis present

## 2019-10-13 DIAGNOSIS — I129 Hypertensive chronic kidney disease with stage 1 through stage 4 chronic kidney disease, or unspecified chronic kidney disease: Secondary | ICD-10-CM | POA: Diagnosis present

## 2019-10-13 DIAGNOSIS — E876 Hypokalemia: Secondary | ICD-10-CM | POA: Diagnosis present

## 2019-10-13 DIAGNOSIS — G8929 Other chronic pain: Secondary | ICD-10-CM | POA: Diagnosis present

## 2019-10-13 DIAGNOSIS — K5090 Crohn's disease, unspecified, without complications: Secondary | ICD-10-CM | POA: Diagnosis present

## 2019-10-13 DIAGNOSIS — N390 Urinary tract infection, site not specified: Secondary | ICD-10-CM | POA: Diagnosis present

## 2019-10-13 DIAGNOSIS — N183 Chronic kidney disease, stage 3 unspecified: Secondary | ICD-10-CM | POA: Diagnosis present

## 2019-10-13 DIAGNOSIS — D649 Anemia, unspecified: Secondary | ICD-10-CM

## 2019-10-13 DIAGNOSIS — R197 Diarrhea, unspecified: Secondary | ICD-10-CM | POA: Diagnosis not present

## 2019-10-13 DIAGNOSIS — E860 Dehydration: Secondary | ICD-10-CM | POA: Diagnosis present

## 2019-10-13 DIAGNOSIS — Z885 Allergy status to narcotic agent status: Secondary | ICD-10-CM | POA: Diagnosis not present

## 2019-10-13 DIAGNOSIS — Z20828 Contact with and (suspected) exposure to other viral communicable diseases: Secondary | ICD-10-CM | POA: Diagnosis present

## 2019-10-13 DIAGNOSIS — K529 Noninfective gastroenteritis and colitis, unspecified: Secondary | ICD-10-CM | POA: Diagnosis not present

## 2019-10-13 DIAGNOSIS — Z884 Allergy status to anesthetic agent status: Secondary | ICD-10-CM | POA: Diagnosis not present

## 2019-10-13 DIAGNOSIS — N179 Acute kidney failure, unspecified: Secondary | ICD-10-CM | POA: Diagnosis present

## 2019-10-13 DIAGNOSIS — R509 Fever, unspecified: Secondary | ICD-10-CM | POA: Insufficient documentation

## 2019-10-13 LAB — BASIC METABOLIC PANEL
Anion gap: 13 (ref 5–15)
Anion gap: 8 (ref 5–15)
BUN: 19 mg/dL (ref 6–20)
BUN: 17 mg/dL (ref 6–20)
CO2: 18 mmol/L — ABNORMAL LOW (ref 22–32)
CO2: 17 mmol/L — ABNORMAL LOW (ref 22–32)
Calcium: 8.2 mg/dL — ABNORMAL LOW (ref 8.9–10.3)
Calcium: 8 mg/dL — ABNORMAL LOW (ref 8.9–10.3)
Chloride: 110 mmol/L (ref 98–111)
Chloride: 111 mmol/L (ref 98–111)
Creatinine, Ser: 1.8 mg/dL — ABNORMAL HIGH (ref 0.44–1.00)
Creatinine, Ser: 1.57 mg/dL — ABNORMAL HIGH (ref 0.44–1.00)
GFR calc Af Amer: 35 mL/min — ABNORMAL LOW (ref >60)
GFR calc Af Amer: 42 mL/min — ABNORMAL LOW (ref >60)
GFR calc non Af Amer: 30 mL/min — ABNORMAL LOW (ref >60)
GFR calc non Af Amer: 36 mL/min — ABNORMAL LOW (ref >60)
Glucose, Bld: 78 mg/dL (ref 70–99)
Glucose, Bld: 117 mg/dL — ABNORMAL HIGH (ref 70–99)
Potassium: 2.5 mmol/L — CL (ref 3.5–5.1)
Potassium: 3.6 mmol/L (ref 3.5–5.1)
Sodium: 141 mmol/L (ref 135–145)
Sodium: 136 mmol/L (ref 135–145)

## 2019-10-13 LAB — URINALYSIS, ROUTINE W REFLEX MICROSCOPIC
Bilirubin Urine: NEGATIVE
Glucose, UA: NEGATIVE mg/dL
Hgb urine dipstick: NEGATIVE
Ketones, ur: NEGATIVE mg/dL
Nitrite: NEGATIVE
Protein, ur: NEGATIVE mg/dL
Specific Gravity, Urine: 1.008 (ref 1.005–1.030)
pH: 6 (ref 5.0–8.0)

## 2019-10-13 LAB — IRON AND TIBC
Iron: 10 ug/dL — ABNORMAL LOW (ref 28–170)
Saturation Ratios: 8 % — ABNORMAL LOW (ref 10.4–31.8)
TIBC: 122 ug/dL — ABNORMAL LOW (ref 250–450)
UIBC: 112 ug/dL

## 2019-10-13 LAB — FOLATE: Folate: 13.5 ng/mL (ref >5.9)

## 2019-10-13 LAB — SARS CORONAVIRUS 2 (TAT 6-24 HRS): SARS Coronavirus 2: NEGATIVE

## 2019-10-13 LAB — CBC
HCT: 22.8 % — ABNORMAL LOW (ref 36.0–46.0)
Hemoglobin: 7.6 g/dL — ABNORMAL LOW (ref 12.0–15.0)
MCH: 30.8 pg (ref 26.0–34.0)
MCHC: 33.3 g/dL (ref 30.0–36.0)
MCV: 92.3 fL (ref 80.0–100.0)
Platelets: 87 10*3/uL — ABNORMAL LOW (ref 150–400)
RBC: 2.47 MIL/uL — ABNORMAL LOW (ref 3.87–5.11)
RDW: 14.5 % (ref 11.5–15.5)
WBC: 6.4 10*3/uL (ref 4.0–10.5)
nRBC: 0 % (ref 0.0–0.2)

## 2019-10-13 LAB — URINE CULTURE

## 2019-10-13 LAB — MAGNESIUM: Magnesium: 1 mg/dL — ABNORMAL LOW (ref 1.7–2.4)

## 2019-10-13 LAB — RETICULOCYTES
Immature Retic Fract: 9.5 % (ref 2.3–15.9)
RBC.: 2.51 MIL/uL — ABNORMAL LOW (ref 3.87–5.11)
Retic Count, Absolute: 21.1 10*3/uL (ref 19.0–186.0)
Retic Ct Pct: 0.8 % (ref 0.4–3.1)

## 2019-10-13 LAB — C DIFFICILE QUICK SCREEN W PCR REFLEX
C Diff antigen: NEGATIVE
C Diff interpretation: NOT DETECTED
C Diff toxin: NEGATIVE

## 2019-10-13 LAB — FERRITIN: Ferritin: 226 ng/mL (ref 11–307)

## 2019-10-13 LAB — I-STAT BETA HCG BLOOD, ED (MC, WL, AP ONLY): I-stat hCG, quantitative: <5 m[IU]/mL (ref <5)

## 2019-10-13 LAB — HIV ANTIBODY (ROUTINE TESTING W REFLEX): HIV Screen 4th Generation wRfx: NONREACTIVE — AB

## 2019-10-13 LAB — VITAMIN B12: Vitamin B-12: 846 pg/mL (ref 180–914)

## 2019-10-13 MED ORDER — POTASSIUM CHLORIDE IN NACL 40-0.9 MEQ/L-% IV SOLN
INTRAVENOUS | Status: AC
  Filled 2019-10-13: qty 1000

## 2019-10-13 MED ORDER — POTASSIUM CHLORIDE 10 MEQ/100ML IV SOLN
10.0000 meq | INTRAVENOUS | Status: AC
  Administered 2019-10-13 (×4): 10 meq via INTRAVENOUS
  Filled 2019-10-13 (×4): qty 100

## 2019-10-13 MED ORDER — ENSURE ENLIVE PO LIQD: 237.0000 mL | Freq: Two times a day (BID) | ORAL | Status: DC

## 2019-10-13 MED ORDER — POTASSIUM CHLORIDE CRYS ER 20 MEQ PO TBCR
30.0000 meq | EXTENDED_RELEASE_TABLET | ORAL | Status: DC
  Administered 2019-10-13: 30 meq via ORAL
  Filled 2019-10-13: qty 1

## 2019-10-13 MED ORDER — POTASSIUM CHLORIDE 10 MEQ/100ML IV SOLN
INTRAVENOUS | Status: AC
  Administered 2019-10-13: 10 meq via INTRAVENOUS
  Filled 2019-10-13: qty 100

## 2019-10-13 MED ORDER — POTASSIUM CHLORIDE IN NACL 40-0.9 MEQ/L-% IV SOLN
INTRAVENOUS | Status: DC
  Administered 2019-10-13: 75 mL/h via INTRAVENOUS
  Filled 2019-10-13: qty 1000

## 2019-10-13 MED ORDER — SODIUM CHLORIDE 0.9 % IV SOLN: INTRAVENOUS | Status: DC

## 2019-10-13 MED ORDER — POTASSIUM CHLORIDE CRYS ER 20 MEQ PO TBCR
40.0000 meq | EXTENDED_RELEASE_TABLET | ORAL | Status: AC
  Administered 2019-10-13: 40 meq via ORAL
  Filled 2019-10-13: qty 2

## 2019-10-13 MED ORDER — ONDANSETRON HCL 4 MG/2ML IJ SOLN: 4.0000 mg | Freq: Four times a day (QID) | INTRAMUSCULAR | Status: DC | PRN

## 2019-10-13 MED ORDER — CHLORHEXIDINE GLUCONATE CLOTH 2 % EX PADS
6.0000 | MEDICATED_PAD | Freq: Every day | CUTANEOUS | Status: DC
  Administered 2019-10-13: 6 via TOPICAL

## 2019-10-13 MED ORDER — POTASSIUM CHLORIDE 10 MEQ/100ML IV SOLN
10.0000 meq | Freq: Once | INTRAVENOUS | Status: AC
  Administered 2019-10-13: 01:00:00 10 meq via INTRAVENOUS

## 2019-10-13 MED ORDER — MAGNESIUM SULFATE 4 GM/100ML IV SOLN
4.0000 g | Freq: Once | INTRAVENOUS | Status: AC
  Administered 2019-10-13: 4 g via INTRAVENOUS
  Filled 2019-10-13: qty 100

## 2019-10-13 NOTE — ED Notes (Signed)
Lunch Tray Ordered @ 1006.

## 2019-10-13 NOTE — ED Notes (Signed)
PAGED FM TO RN ELIZABETH--Caitlin Peters

## 2019-10-13 NOTE — ED Notes (Signed)
Diet ordered 

## 2019-10-13 NOTE — ED Notes (Signed)
Called pharmacy, will adjust potassium start times so that pt can get her third run of potassium.

## 2019-10-13 NOTE — ED Notes (Signed)
Admitting physician paged.

## 2019-10-13 NOTE — Progress Notes (Signed)
PROGRESS NOTE    Caitlin Peters  AXK:[id number] DOB: Jun 06, 1961 DOA: 10/12/2019 PCP: System, Pcp Not In  Brief Narrative: This is a chronically ill 58 year old female with Crohn's disease, multiple abdominal surgeries, chronic anemia, chronic pain, short gut syndrome was recently hospitalized at [facility] in [place] with multiple problems including a line infection, she reports that she is frequently hospitalized at least once a month for several years now. -During her last hospitalization 2 to 3 weeks ago she was treated for a line infection, they removed her PICC/central line and placed a central line in her left thigh, she was discharged home on 10/21, she gets a bag of saline with electrolytes daily via this line, did not have this the day prior to admission, she also requires frequent hospitalizations for anemia requiring blood transfusions as well.  She has chronic diarrhea and takes Lomotil 4 times daily, just got off TPN over a month ago. -Patient just moved to [facility] with her daughter 2 days ago and presented to the ED overnight with symptoms of " I just did not feel right", in addition she also reported low-grade fevers off and on for 2 to 3 days, also reported mild swelling in both arms and legs. -In the emergency room she was noted to have severe hypokalemia with potassium of 2.4, creatinine 1.7, hemoglobin of 8.1, chest x-ray noted cardiomegaly with mild pulmonary edema, she was given 3 rounds of IV potassium and Bactrim for presumed UTI,, urinalysis pending -CT abdomen pelvis noted sequelae of multiple abdominal surgeries but no acute findings  Assessment & Plan:   Hypokalemia due to excessive gastrointestinal loss of potassium, hypomagnesemia Chronic diarrhea, short gut syndrome Crohn's disease ([place]) -Complicated GI history at least 7 or 8 abdominal surgeries for Crohn's disease, reports being on multiple medications however did not bring her list of medicines,  pharmacy is attempting to obtain medication list -CT abdomen pelvis is unremarkable -Replace potassium aggressively, given IV magnesium as well overnight -Recheck tomorrow -Will resume home medications once confirmed -will need to establish care with gastroenterology in [place]    Fever of Unknown Origin  -She was febrile to 101 upon initial arrival to the ED overnight, afebrile since, COVID-19 PCR is negative, chest x-ray and CT abdomen pelvis are unremarkable -Given groin central line bacteremia is a possibility, no signs or symptoms of sepsis at this time, monitor without antibiotics, follow-up blood cultures -Follow-up GI pathogen panel, clinical suspicion is low  AKI versus CKD 3 -Baseline creatinine unknown, admission creatinine was 1.75 likely from GI losses -Continue gentle IV fluids will decrease rate to 75 for 12 more hours with potassium -Monitor urine output and kidney function    Trace bilateral lower extremity edema  -This is likely secondary to third spacing, requiring continuous fluid supplementation for short gut syndrome  Chronic anemia -patient reports that she frequently requires blood transfusions -No overt bleeding reported, check anemia panel, transfuse if drops less than 7, give IV iron if anemia panel suggests iron deficiency   Hypertension -continue home Metoprolol 100 mg BID and Norvasc 10 mg qd   DVT prophylaxis: Lovenox   CODE STATUS: Full   Consults called: None   Family Communication:  No family at bedside  Disposition: To be determined Antimicrobials:    Subjective: Sitting up in bed AAOx3, complains of chronic pain in her belly, complains of her trace edema in her legs Objective: Vitals:   10/13/19 0830 10/13/19 0835 10/13/19 1000 10/13/19 1100  BP:  [name] Kitchen)  143/68 (!) 156/77 138/72  Pulse: 81  69 64  Resp: 15  15 14   Temp:      TempSrc:      SpO2: 100%  100% 98%    Intake/Output Summary (Last 24 hours) at 10/13/2019 1414  Last data filed at 10/13/2019 1151 Gross per 24 hour  Intake 1301.14 ml  Output -  Net 1301.14 ml   There were no vitals filed for this visit.  Examination:  General exam: AAOx3, chronically ill female Respiratory system: Clear to auscultation. Cardiovascular system: S1 & S2 heard, RRR.  astrointestinal system: Abdomen is nondistended, soft and nontender.Normal bowel sounds heard. Central nervous system: Alert and oriented. No focal neurological deficits. Extremities: Trace edema Skin: No rashes, lesions or ulcers Psychiatry:  Mood & affect appropriate.     Data Reviewed:   CBC: Recent Labs  Lab 10/12/19 1703 10/13/19 0409  WBC 9.8 6.4  HGB 8.1* 7.6*  HCT 24.7* 22.8*  MCV 93.2 92.3  PLT 95* 87*   Basic Metabolic Panel: Recent Labs  Lab 10/12/19 1703 10/13/19 0408 10/13/19 0409  NA 139  --  141  K 2.4*  --  2.5*  CL 107  --  110  CO2 20*  --  18*  GLUCOSE 84  --  78  BUN 20  --  19  CREATININE 1.75*  --  1.80*  CALCIUM 8.5*  --  8.2*  MG  --  1.0*  --    GFR: CrCl cannot be calculated (Unknown ideal weight.). Liver Function Tests: Recent Labs  Lab 10/12/19 1703  AST 27  ALT 43  ALKPHOS 175*  BILITOT 1.2  PROT 5.7*  ALBUMIN 2.7*   Recent Labs  Lab 10/12/19 1703  LIPASE 19   No results for input(s): AMMONIA in the last 168 hours. Coagulation Profile: No results for input(s): INR, PROTIME in the last 168 hours. Cardiac Enzymes: No results for input(s): CKTOTAL, CKMB, CKMBINDEX, TROPONINI in the last 168 hours. BNP (last 3 results) No results for input(s): PROBNP in the last 8760 hours. HbA1C: No results for input(s): HGBA1C in the last 72 hours. CBG: No results for input(s): GLUCAP in the last 168 hours. Lipid Profile: No results for input(s): CHOL, HDL, LDLCALC, TRIG, CHOLHDL, LDLDIRECT in the last 72 hours. Thyroid Function Tests: No results for input(s): TSH, T4TOTAL, FREET4, T3FREE, THYROIDAB in the last 72 hours. Anemia Panel: No  results for input(s): VITAMINB12, FOLATE, FERRITIN, TIBC, IRON, RETICCTPCT in the last 72 hours. Urine analysis:    Component Value Date/Time   COLORURINE YELLOW 10/12/2019 2350   APPEARANCEUR HAZY (A) 10/12/2019 2350   LABSPEC 1.008 10/12/2019 2350   PHURINE 6.0 10/12/2019 2350   GLUCOSEU NEGATIVE 10/12/2019 2350   HGBUR NEGATIVE 10/12/2019 2350   BILIRUBINUR NEGATIVE 10/12/2019 2350   KETONESUR NEGATIVE 10/12/2019 2350   PROTEINUR NEGATIVE 10/12/2019 2350   NITRITE NEGATIVE 10/12/2019 2350   LEUKOCYTESUR SMALL (A) 10/12/2019 2350   Sepsis Labs: @LABRCNTIP (procalcitonin:4,lacticidven:4)  ) Recent Results (from the past 240 hour(s))  Blood culture (routine x 2)     Status: None (Preliminary result)   Collection Time: 10/12/19  8:59 PM   Specimen: BLOOD LEFT HAND  Result Value Ref Range Status   Specimen Description BLOOD LEFT HAND  Final   Special Requests   Final    BOTTLES DRAWN AEROBIC AND ANAEROBIC Blood Culture results may not be optimal due to an inadequate volume of blood received in culture bottles   Culture   Final  NO GROWTH < 12 HOURS Performed at [facility] [address] [place], [place] [address]    Report Status PENDING  Incomplete  SARS CORONAVIRUS 2 (TAT 6-24 HRS)     Status: None   Collection Time: 10/12/19 11:53 PM  Result Value Ref Range Status   SARS Coronavirus 2 NEGATIVE NEGATIVE Final    Comment: (NOTE) SARS-CoV-2 target nucleic acids are NOT DETECTED. The SARS-CoV-2 RNA is generally detectable in upper and lower respiratory specimens during the acute phase of infection. Negative results do not preclude SARS-CoV-2 infection, do not rule out co-infections with other pathogens, and should not be used as the sole basis for treatment or other patient management decisions. Negative results must be combined with clinical observations, patient history, and epidemiological information. The expected result is Negative. Fact Sheet for Patients:  SugarRoll.be Fact Sheet for Healthcare Providers: https://www.woods-mathews.com/ This test is not yet approved or cleared by the [place] FDA and  has been authorized for detection and/or diagnosis of SARS-CoV-2 by FDA under an Emergency Use Authorization (EUA). This EUA will remain  in effect (meaning this test can be used) for the duration of the COVID-19 declaration under Section 56 4(b)(1) of the Act, 21 U.S.C. section 360bbb-3(b)(1), unless the authorization is terminated or revoked sooner. Performed at [facility], [place] [address], [place], [place] [address]   Blood culture (routine x 2)     Status: None (Preliminary result)   Collection Time: 10/13/19  4:08 AM   Specimen: BLOOD  Result Value Ref Range Status   Specimen Description BLOOD RIGHT ARM  Final   Special Requests   Final    BOTTLES DRAWN AEROBIC AND ANAEROBIC Blood Culture adequate volume   Culture   Final    NO GROWTH < 12 HOURS Performed at [facility], [place] [address], [place], [place] [address]    Report Status PENDING  Incomplete         Radiology Studies: Ct Abdomen Pelvis Wo Contrast  Result Date: 10/12/2019 CLINICAL DATA:  58 year old female with abdominal pain, fever and lower extremity swelling. Crohn disease EXAM: CT ABDOMEN AND PELVIS WITHOUT CONTRAST TECHNIQUE: Multidetector CT imaging of the abdomen and pelvis was performed following the standard protocol without IV contrast. COMPARISON:  Chest radiographs earlier today. FINDINGS: Lower chest: Minor costophrenic angle atelectasis or scarring. Small surgical clips along the anterior right costophrenic angle. Cardiac size at the upper limits of normal. No pericardial or pleural effusion. Hepatobiliary: Gallbladder seems to be surgically absent with dilated hepatic ducts at the porta hepatis and CBD approximately 11-12 millimeters diameter. Mild additional intrahepatic biliary ductal dilatation  suspected. No discrete liver lesion in the absence of contrast. Pancreas: Poorly delineated in the absence of contrast, probably atrophied. Spleen: Negative noncontrast appearance. Adrenals/Urinary Tract: No adrenal gland enlargement identified. No hydronephrosis, perinephric stranding or nephrolithiasis. Mildly distended but otherwise unremarkable urinary bladder. Stomach/Bowel: Negative rectum. Sigmoid and descending colon are mildly distended with a mix of stool and fluid. Gas and fluid at the splenic flexure. Surgical clips in the left upper quadrant appear to be related to previous right hemicolectomy with a small bowel anastomosis to the distal transverse colon. But regional small bowel loops are poorly delineated in the absence of contrast. Numerous additional a bowel staples and surgical clips elsewhere in the mid and right abdomen where fluid-filled small bowel loops are at the upper limits of normal for size. The stomach seems to be decompressed. The duodenum is difficult  to delineate. No free air.  No free fluid identified. Vascular/Lymphatic: Vascular patency is not evaluated in the absence of IV contrast. There is a catheter like device tunneled in the left proximal anterior thigh subcutaneous fat (series 3, image 85), which courses in the left iliac vein to the confluence with the IVC. Mild Calcified aortic atherosclerosis. No lymphadenopathy is evident. Reproductive: The uterus appears to be surgically absent. Ovaries are diminutive or absent. Other: No pelvic free fluid. Musculoskeletal: Osteopenia. Mild lumbar scoliosis. Advanced lower lumbar facet degeneration. No acute osseous abnormality identified. IMPRESSION: 1. Extensive postoperative changes to bowel, with evidence of prior hemicolectomy and left upper quadrant small to large bowel anastomosis. No bowel inflammation is evident in the absence of contrast. 2. Gallbladder seems to be surgically absent with intra-and extrahepatic biliary ductal  enlargement. Correlate for hyperbilirubinemia. 3. No urinary calculus or obstructive uropathy. 4. Tunneled left femoral vein approach central venous catheter is in place. Electronically Signed   By: Genevie Ann M.D.   On: 10/12/2019 22:34   Dg Chest 2 View  Result Date: 10/12/2019 CLINICAL DATA:  Leg swelling EXAM: CHEST - 2 VIEW COMPARISON:  None. FINDINGS: Heart and mediastinal contours are within normal limits. No focal opacities or effusions. No acute bony abnormality. IMPRESSION: No active cardiopulmonary disease. Electronically Signed   By: Rolm Baptise M.D.   On: 10/12/2019 21:17        Scheduled Meds: . amLODipine  10 mg Oral Daily  . enoxaparin (LOVENOX) injection  40 mg Subcutaneous Q24H  . metoprolol tartrate  100 mg Oral BID   Continuous Infusions: . 0.9 % NaCl with KCl 40 mEq / L 75 mL/hr (10/13/19 1148)     LOS: 0 days    Time spent: 23mn    PDomenic Polite MD [facility] Page via [email or web address], password [email or web address] After 7PM please contact night-coverage  10/13/2019, 2:14 PM

## 2019-10-13 NOTE — ED Notes (Signed)
Patient placement returned page to [facility]. They stated they would notify Dr. Baltazar Najjar.

## 2019-10-13 NOTE — Progress Notes (Signed)
Pt arrived to [place] [facility]. Identified appropriately, alert and oriented x 4, VS stable, no signs of acute distress. Pt denied SOB, chest pain. Ambulated from stretcher to bed, steady gait.  Cardiac monitor placed on pt and CCMD notified. Pt oriented to room and equipment instructed to call for assistance, and call bell left within reach. Will continue to monitor pt and treat per MD orders.

## 2019-10-13 NOTE — ED Notes (Signed)
Ordered breakfast--Caitlin Peters 

## 2019-10-13 NOTE — ED Notes (Signed)
PAGED [facility] TO RN ELIZABETH--Javious Hallisey

## 2019-10-13 NOTE — ED Notes (Signed)
Messaged Pharmacy for Magnesium Sulfate.

## 2019-10-14 ENCOUNTER — Other Ambulatory Visit: Payer: Self-pay

## 2019-10-14 DIAGNOSIS — N179 Acute kidney failure, unspecified: Secondary | ICD-10-CM

## 2019-10-14 DIAGNOSIS — K5090 Crohn's disease, unspecified, without complications: Secondary | ICD-10-CM

## 2019-10-14 DIAGNOSIS — K529 Noninfective gastroenteritis and colitis, unspecified: Secondary | ICD-10-CM

## 2019-10-14 LAB — GASTROINTESTINAL PANEL BY PCR, STOOL (REPLACES STOOL CULTURE)

## 2019-10-14 LAB — CBC
HCT: 23.5 % — ABNORMAL LOW (ref 36.0–46.0)
Hemoglobin: 7.8 g/dL — ABNORMAL LOW (ref 12.0–15.0)
MCH: 30.8 pg (ref 26.0–34.0)
MCHC: 33.2 g/dL (ref 30.0–36.0)
MCV: 92.9 fL (ref 80.0–100.0)
Platelets: 99 10*3/uL — ABNORMAL LOW (ref 150–400)
RBC: 2.53 MIL/uL — ABNORMAL LOW (ref 3.87–5.11)
RDW: 14.7 % (ref 11.5–15.5)
WBC: 7.3 10*3/uL (ref 4.0–10.5)
nRBC: 0 % (ref 0.0–0.2)

## 2019-10-14 LAB — COMPREHENSIVE METABOLIC PANEL
ALT: 26 U/L (ref 0–44)
AST: 15 U/L (ref 15–41)
Albumin: 2.2 g/dL — ABNORMAL LOW (ref 3.5–5.0)
Alkaline Phosphatase: 163 U/L — ABNORMAL HIGH (ref 38–126)
Anion gap: 8 (ref 5–15)
BUN: 16 mg/dL (ref 6–20)
CO2: 20 mmol/L — ABNORMAL LOW (ref 22–32)
Calcium: 8.3 mg/dL — ABNORMAL LOW (ref 8.9–10.3)
Chloride: 113 mmol/L — ABNORMAL HIGH (ref 98–111)
Creatinine, Ser: 1.5 mg/dL — ABNORMAL HIGH (ref 0.44–1.00)
GFR calc Af Amer: 44 mL/min — ABNORMAL LOW (ref >60)
GFR calc non Af Amer: 38 mL/min — ABNORMAL LOW (ref >60)
Glucose, Bld: 100 mg/dL — ABNORMAL HIGH (ref 70–99)
Potassium: 3.9 mmol/L (ref 3.5–5.1)
Sodium: 141 mmol/L (ref 135–145)
Total Bilirubin: 0.7 mg/dL (ref 0.3–1.2)
Total Protein: 5.2 g/dL — ABNORMAL LOW (ref 6.5–8.1)

## 2019-10-14 MED ORDER — SODIUM CHLORIDE 0.9% FLUSH: 10.0000 mL | INTRAVENOUS | Status: DC | PRN

## 2019-10-14 MED ORDER — SODIUM CHLORIDE 0.9% FLUSH: 10.0000 mL | Freq: Two times a day (BID) | INTRAVENOUS | Status: DC

## 2019-10-14 MED ORDER — HYDROMORPHONE HCL 2 MG PO TABS
4.0000 mg | ORAL_TABLET | Freq: Once | ORAL | Status: AC
  Administered 2019-10-14: 4 mg via ORAL
  Filled 2019-10-14: qty 2

## 2019-10-14 MED ORDER — POTASSIUM CHLORIDE CRYS ER 20 MEQ PO TBCR: 40.0000 meq | EXTENDED_RELEASE_TABLET | Freq: Every day | ORAL | 0 refills | Status: DC

## 2019-10-14 NOTE — Progress Notes (Addendum)
CSW spoke with patient regarding having her medication sent to [facility]. She stated that she just refilled all her medications before moving down to live with her daughter and she confirmed she already picked up the potassium chloride SA (KLOR-CON) 20 MEQ tablet. She is declining having it processed at this time and will contact her PCP when it is time to be refilled. Patient to have her Medicaid transferred to [facility] and CSW will locate a local PCP to transition. Patient is requesting a home Doctor, general practice. CSW following up.   Caitlin Locus Seriyah Collison LCSW [phone]

## 2019-10-14 NOTE — Discharge Summary (Signed)
Physician Discharge Summary  Caitlin Peters TDD:[id number] DOB: 26-Jun-1961 DOA: 10/12/2019  PCP: System, Pcp Not In  Admit date: 10/12/2019 Discharge date: 10/14/2019  Admitted From: Home Disposition: Home  Recommendations for Outpatient Follow-up:  1. Follow up with PCP in 1 week with repeat CBC/BMP 2. Outpatient follow-up with gastroenterology 3. Follow up in ED if symptoms worsen or new appear   Home Health: RN Equipment/Devices: None  Discharge Condition: Stable CODE STATUS: Full Diet recommendation: Heart healthy  Brief/Interim Summary: 58 year old female with history of Crohn's disease, multiple abdominal surgeries, chronic anemia, chronic pain, short gut syndrome, multiple hospitalizations for diarrhea and line infection, recent hospitalization at [facility] in [place] with multiple problems including a line infection which resulted in removal of her PICC line and placement of a central line in her left thigh and subsequent discharge home on 09/16/2019 presented with symptoms of not feeling right along with low-grade fevers.  She was found to be hypokalemic on presentation with potassium of 2.4.  CT abdomen and pelvis showed sequelae of multiple abdominal surgeries but no acute findings.  She was treated with IV fluids and potassium was supplemented.  Patient was initially febrile but subsequently has had no temperature spikes.  Work-up has been negative for any kind of infection.  Currently she is afebrile and feels better.  She will be discharged home.  She will need to follow-up with a local PCP here along with GI evaluation and follow-up.  Discharge Diagnoses:   Hypokalemia due to excessive GI loss of potassium: Improved Hypomagnesemia Crohn's disease with history of multiple abdominal surgeries Chronic diarrhea/short gut syndrome -Complicated GI history with at least 7-8 abdominal surgeries for Crohn's disease -CT of the abdomen and pelvis  unremarkable -Potassium has been replaced.  Potassium has been normal.  Will discharge home on oral potassium supplementation.  Outpatient follow-up of BMP -She recently moved to [place].  She will need to have to establish with a local gastroenterologist for evaluation and follow-up  Fever -Was febrile to 101 on presentation.  COVID-19 negative.  Chest x-ray and CT of the abdomen and pelvis were unremarkable.  No evidence of bacteremia. -GI PCR negative.  Afebrile since admission.  Discharged home with no antibiotics.  Acute kidney injury on chronic renal disease stage III -Baseline creatinine unknown.  Treated with IV fluids.  Creatinine stable.  Outpatient follow-up  Anemia of chronic disease -No overt bleeding noted.  Hemoglobin 7.8.  Outpatient follow-up  Hypertension  -continue home regimen.  Discharge Instructions  Discharge Instructions    Ambulatory referral to Gastroenterology   Complete by: As directed    H/o Crohn's disease with multiple surgeries. Recently moved from [place]. Needs to establish GI here   Diet - low sodium heart healthy   Complete by: As directed    Increase activity slowly   Complete by: As directed      Allergies as of 10/14/2019      Reactions   Gabapentin Other (See Comments)   unknown   Lyrica [pregabalin] Other (See Comments)   unknown   Topamax [topiramate] Other (See Comments)   unknown   Fentanyl Rash   Morphine And Related Rash      Medication List    STOP taking these medications   sulfamethoxazole-trimethoprim 800-160 MG tablet Commonly known as: BACTRIM DS     TAKE these medications   amLODipine 5 MG tablet Commonly known as: NORVASC Take 5 mg by mouth daily.   buPROPion 100 MG 12 hr tablet  Commonly known as: WELLBUTRIN SR Take 100 mg by mouth 2 (two) times daily.   cycloSPORINE 0.05 % ophthalmic emulsion Commonly known as: RESTASIS Place 1 drop into both eyes 2 (two) times daily.   Dexilant 60 MG capsule Generic  drug: dexlansoprazole Take 60 mg by mouth daily.   dicyclomine 10 MG capsule Commonly known as: BENTYL Take 10 mg by mouth 3 (three) times daily before meals.   estradiol 2 MG tablet Commonly known as: ESTRACE Take 2 mg by mouth daily.   HYDROmorphone 4 MG tablet Commonly known as: DILAUDID Take 4 mg by mouth every 6 (six) hours as needed for moderate pain or severe pain.   methadone 10 MG tablet Commonly known as: DOLOPHINE Take 20 mg by mouth every 12 (twelve) hours.   metoprolol tartrate 100 MG tablet Commonly known as: LOPRESSOR Take 100 mg by mouth 2 (two) times daily.   potassium chloride SA 20 MEQ tablet Commonly known as: KLOR-CON Take 2 tablets (40 mEq total) by mouth daily.   promethazine 25 MG tablet Commonly known as: PHENERGAN Take 25 mg by mouth every 6 (six) hours as needed for nausea or vomiting.      Follow-up Information    PCP. Schedule an appointment as soon as possible for a visit in 1 week(s).   Why: with repeat cbc/bmp       Therapist, music at [facility]. Go on 10/15/2019.   Specialty: Family Medicine Why: New patient appointment at 1:30pm with Dr. Jerilee Hoh (arrive 32mns early). Please call upon arrival and the nurse will come to the car and check you in: [phone]  Contact information: [address] Follow up.   Specialty: Home Health Services Why: Home Health RN arranged.  Contact information: [phone]         Allergies  Allergen Reactions  . Gabapentin Other (See Comments)    unknown  . Lyrica [Pregabalin] Other (See Comments)    unknown  . Topamax [Topiramate] Other (See Comments)    unknown  . Fentanyl Rash  . Morphine And Related Rash    Consultations:  None   Procedures/Studies: Ct Abdomen Pelvis Wo Contrast  Result Date: 10/12/2019 CLINICAL DATA:  58year old female  with abdominal pain, fever and lower extremity swelling. Crohn disease EXAM: CT ABDOMEN AND PELVIS WITHOUT CONTRAST TECHNIQUE: Multidetector CT imaging of the abdomen and pelvis was performed following the standard protocol without IV contrast. COMPARISON:  Chest radiographs earlier today. FINDINGS: Lower chest: Minor costophrenic angle atelectasis or scarring. Small surgical clips along the anterior right costophrenic angle. Cardiac size at the upper limits of normal. No pericardial or pleural effusion. Hepatobiliary: Gallbladder seems to be surgically absent with dilated hepatic ducts at the porta hepatis and CBD approximately 11-12 millimeters diameter. Mild additional intrahepatic biliary ductal dilatation suspected. No discrete liver lesion in the absence of contrast. Pancreas: Poorly delineated in the absence of contrast, probably atrophied. Spleen: Negative noncontrast appearance. Adrenals/Urinary Tract: No adrenal gland enlargement identified. No hydronephrosis, perinephric stranding or nephrolithiasis. Mildly distended but otherwise unremarkable urinary bladder. Stomach/Bowel: Negative rectum. Sigmoid and descending colon are mildly distended with a mix of stool and fluid. Gas and fluid at the splenic flexure. Surgical clips in the left upper quadrant appear to be related to previous right hemicolectomy with a small bowel anastomosis to the distal transverse colon. But regional small bowel loops are poorly  delineated in the absence of contrast. Numerous additional a bowel staples and surgical clips elsewhere in the mid and right abdomen where fluid-filled small bowel loops are at the upper limits of normal for size. The stomach seems to be decompressed. The duodenum is difficult to delineate. No free air.  No free fluid identified. Vascular/Lymphatic: Vascular patency is not evaluated in the absence of IV contrast. There is a catheter like device tunneled in the left proximal anterior thigh subcutaneous fat  (series 3, image 85), which courses in the left iliac vein to the confluence with the IVC. Mild Calcified aortic atherosclerosis. No lymphadenopathy is evident. Reproductive: The uterus appears to be surgically absent. Ovaries are diminutive or absent. Other: No pelvic free fluid. Musculoskeletal: Osteopenia. Mild lumbar scoliosis. Advanced lower lumbar facet degeneration. No acute osseous abnormality identified. IMPRESSION: 1. Extensive postoperative changes to bowel, with evidence of prior hemicolectomy and left upper quadrant small to large bowel anastomosis. No bowel inflammation is evident in the absence of contrast. 2. Gallbladder seems to be surgically absent with intra-and extrahepatic biliary ductal enlargement. Correlate for hyperbilirubinemia. 3. No urinary calculus or obstructive uropathy. 4. Tunneled left femoral vein approach central venous catheter is in place. Electronically Signed   By: Genevie Ann M.D.   On: 10/12/2019 22:34   Dg Chest 2 View  Result Date: 10/12/2019 CLINICAL DATA:  Leg swelling EXAM: CHEST - 2 VIEW COMPARISON:  None. FINDINGS: Heart and mediastinal contours are within normal limits. No focal opacities or effusions. No acute bony abnormality. IMPRESSION: No active cardiopulmonary disease. Electronically Signed   By: Rolm Baptise M.D.   On: 10/12/2019 21:17       Subjective: Patient seen and examined at bedside.  She feels better and wants to home.  Denies any worsening fever, nausea, vomiting.  Discharge Exam: Vitals:   10/13/19 2243 10/14/19 0533  BP: 130/66 (!) 157/73  Pulse: 63 (!) 57  Resp:  16  Temp:  98.1 F (36.7 C)  SpO2:  100%    General: Pt is alert, awake, not in acute distress Cardiovascular: Intermittent mild bradycardia, S1/S2 + Respiratory: bilateral decreased breath sounds at bases Abdominal: Soft, NT, ND, bowel sounds +.  Multiple past surgical scars present Extremities: Left and is a PICC line present.  Trace lower extremity edema present,  no cyanosis    The results of significant diagnostics from this hospitalization (including imaging, microbiology, ancillary and laboratory) are listed below for reference.     Microbiology: Recent Results (from the past 240 hour(s))  Culture, Urine     Status: Abnormal   Collection Time: 10/12/19  1:35 AM   Specimen: Urine, Random  Result Value Ref Range Status   Specimen Description URINE, RANDOM  Final   Special Requests   Final    NONE Performed at [facility], [address] [place], [place] [address]    Culture MULTIPLE SPECIES PRESENT, SUGGEST RECOLLECTION (A)  Final   Report Status 10/13/2019 FINAL  Final  Blood culture (routine x 2)     Status: None (Preliminary result)   Collection Time: 10/12/19  8:59 PM   Specimen: BLOOD LEFT HAND  Result Value Ref Range Status   Specimen Description BLOOD LEFT HAND  Final   Special Requests   Final    BOTTLES DRAWN AEROBIC AND ANAEROBIC Blood Culture results may not be optimal due to an inadequate volume of blood received in culture bottles   Culture   Final    NO GROWTH  2 DAYS Performed at [facility], [place] [address] [place], [place] [address]    Report Status PENDING  Incomplete  Gastrointestinal Panel by PCR , Stool     Status: None   Collection Time: 10/12/19 10:25 PM   Specimen: STOOL  Result Value Ref Range Status   Campylobacter species NOT DETECTED NOT DETECTED Final   Plesimonas shigelloides NOT DETECTED NOT DETECTED Final   Salmonella species NOT DETECTED NOT DETECTED Final   Yersinia enterocolitica NOT DETECTED NOT DETECTED Final   Vibrio species NOT DETECTED NOT DETECTED Final   Vibrio cholerae NOT DETECTED NOT DETECTED Final   Enteroaggregative E coli (EAEC) NOT DETECTED NOT DETECTED Final   Enteropathogenic E coli (EPEC) NOT DETECTED NOT DETECTED Final   Enterotoxigenic E coli (ETEC) NOT DETECTED NOT DETECTED Final   Shiga like toxin producing E coli (STEC) NOT DETECTED NOT DETECTED Final    Shigella/Enteroinvasive E coli (EIEC) NOT DETECTED NOT DETECTED Final   Cryptosporidium NOT DETECTED NOT DETECTED Final   Cyclospora cayetanensis NOT DETECTED NOT DETECTED Final   Entamoeba histolytica NOT DETECTED NOT DETECTED Final   Giardia lamblia NOT DETECTED NOT DETECTED Final   Adenovirus F40/41 NOT DETECTED NOT DETECTED Final   Astrovirus NOT DETECTED NOT DETECTED Final   Norovirus GI/GII NOT DETECTED NOT DETECTED Final   Rotavirus A NOT DETECTED NOT DETECTED Final   Sapovirus (I, II, IV, and V) NOT DETECTED NOT DETECTED Final    Comment: Performed at [facility], [address] [place], [place] [address]  C Difficile Quick Screen w PCR reflex     Status: None   Collection Time: 10/12/19 10:25 PM   Specimen: STOOL  Result Value Ref Range Status   C Diff antigen NEGATIVE NEGATIVE Final   C Diff toxin NEGATIVE NEGATIVE Final   C Diff interpretation No C. difficile detected.  Final    Comment: Performed at [facility], [place] [address], [place], [place] [address]  SARS CORONAVIRUS 2 (TAT 6-24 HRS)     Status: None   Collection Time: 10/12/19 11:53 PM  Result Value Ref Range Status   SARS Coronavirus 2 NEGATIVE NEGATIVE Final    Comment: (NOTE) SARS-CoV-2 target nucleic acids are NOT DETECTED. The SARS-CoV-2 RNA is generally detectable in upper and lower respiratory specimens during the acute phase of infection. Negative results do not preclude SARS-CoV-2 infection, do not rule out co-infections with other pathogens, and should not be used as the sole basis for treatment or other patient management decisions. Negative results must be combined with clinical observations, patient history, and epidemiological information. The expected result is Negative. Fact Sheet for Patients: SugarRoll.be Fact Sheet for Healthcare Providers: https://www.woods-mathews.com/ This test is not yet approved or cleared by the [place] FDA  and  has been authorized for detection and/or diagnosis of SARS-CoV-2 by FDA under an Emergency Use Authorization (EUA). This EUA will remain  in effect (meaning this test can be used) for the duration of the COVID-19 declaration under Section 56 4(b)(1) of the Act, 21 U.S.C. section 360bbb-3(b)(1), unless the authorization is terminated or revoked sooner. Performed at [facility], [place] [address] [place], [place] [address]   Blood culture (routine x 2)     Status: None (Preliminary result)   Collection Time: 10/13/19  4:08 AM   Specimen: BLOOD  Result Value Ref Range Status   Specimen Description BLOOD RIGHT ARM  Final   Special Requests   Final  BOTTLES DRAWN AEROBIC AND ANAEROBIC Blood Culture adequate volume   Culture   Final    NO GROWTH 1 DAY Performed at [facility], [place] [address] [place], [place] [address]    Report Status PENDING  Incomplete     Labs: BNP (last 3 results) Recent Labs    10/12/19 2059  BNP 962.9*   Basic Metabolic Panel: Recent Labs  Lab 10/12/19 1703 10/13/19 0408 10/13/19 0409 10/13/19 1631 10/14/19 0501  NA 139  --  141 136 141  K 2.4*  --  2.5* 3.6 3.9  CL 107  --  110 111 113*  CO2 20*  --  18* 17* 20*  GLUCOSE 84  --  78 117* 100*  BUN 20  --  19 17 16   CREATININE 1.75*  --  1.80* 1.57* 1.50*  CALCIUM 8.5*  --  8.2* 8.0* 8.3*  MG  --  1.0*  --   --   --    Liver Function Tests: Recent Labs  Lab 10/12/19 1703 10/14/19 0501  AST 27 15  ALT 43 26  ALKPHOS 175* 163*  BILITOT 1.2 0.7  PROT 5.7* 5.2*  ALBUMIN 2.7* 2.2*   Recent Labs  Lab 10/12/19 1703  LIPASE 19   No results for input(s): AMMONIA in the last 168 hours. CBC: Recent Labs  Lab 10/12/19 1703 10/13/19 0409 10/14/19 0501  WBC 9.8 6.4 7.3  HGB 8.1* 7.6* 7.8*  HCT 24.7* 22.8* 23.5*  MCV 93.2 92.3 92.9  PLT 95* 87* 99*   Cardiac Enzymes: No results for input(s): CKTOTAL, CKMB, CKMBINDEX, TROPONINI in the last 168 hours. BNP: Invalid  input(s): POCBNP CBG: No results for input(s): GLUCAP in the last 168 hours. D-Dimer No results for input(s): DDIMER in the last 72 hours. Hgb A1c No results for input(s): HGBA1C in the last 72 hours. Lipid Profile No results for input(s): CHOL, HDL, LDLCALC, TRIG, CHOLHDL, LDLDIRECT in the last 72 hours. Thyroid function studies No results for input(s): TSH, T4TOTAL, T3FREE, THYROIDAB in the last 72 hours.  Invalid input(s): FREET3 Anemia work up Recent Labs    10/13/19 1631  VITAMINB12 846  FOLATE 13.5  FERRITIN 226  TIBC 122*  IRON 10*  RETICCTPCT 0.8   Urinalysis    Component Value Date/Time   COLORURINE YELLOW 10/12/2019 2350   APPEARANCEUR HAZY (A) 10/12/2019 2350   LABSPEC 1.008 10/12/2019 2350   PHURINE 6.0 10/12/2019 2350   GLUCOSEU NEGATIVE 10/12/2019 2350   HGBUR NEGATIVE 10/12/2019 2350   BILIRUBINUR NEGATIVE 10/12/2019 2350   KETONESUR NEGATIVE 10/12/2019 2350   PROTEINUR NEGATIVE 10/12/2019 2350   NITRITE NEGATIVE 10/12/2019 2350   LEUKOCYTESUR SMALL (A) 10/12/2019 2350   Sepsis Labs Invalid input(s): PROCALCITONIN,  WBC,  LACTICIDVEN Microbiology Recent Results (from the past 240 hour(s))  Culture, Urine     Status: Abnormal   Collection Time: 10/12/19  1:35 AM   Specimen: Urine, Random  Result Value Ref Range Status   Specimen Description URINE, RANDOM  Final   Special Requests   Final    NONE Performed at [facility], [address] [place], [place] [address]    Culture MULTIPLE SPECIES PRESENT, SUGGEST RECOLLECTION (A)  Final   Report Status 10/13/2019 FINAL  Final  Blood culture (routine x 2)     Status: None (Preliminary result)   Collection Time: 10/12/19  8:59 PM   Specimen: BLOOD LEFT HAND  Result Value Ref Range Status   Specimen Description BLOOD  LEFT HAND  Final   Special Requests   Final    BOTTLES DRAWN AEROBIC AND ANAEROBIC Blood Culture results may not be optimal due to an inadequate volume of blood received in culture  bottles   Culture   Final    NO GROWTH 2 DAYS Performed at [facility], [place] [address], [place], [place] [address]    Report Status PENDING  Incomplete  Gastrointestinal Panel by PCR , Stool     Status: None   Collection Time: 10/12/19 10:25 PM   Specimen: STOOL  Result Value Ref Range Status   Campylobacter species NOT DETECTED NOT DETECTED Final   Plesimonas shigelloides NOT DETECTED NOT DETECTED Final   Salmonella species NOT DETECTED NOT DETECTED Final   Yersinia enterocolitica NOT DETECTED NOT DETECTED Final   Vibrio species NOT DETECTED NOT DETECTED Final   Vibrio cholerae NOT DETECTED NOT DETECTED Final   Enteroaggregative E coli (EAEC) NOT DETECTED NOT DETECTED Final   Enteropathogenic E coli (EPEC) NOT DETECTED NOT DETECTED Final   Enterotoxigenic E coli (ETEC) NOT DETECTED NOT DETECTED Final   Shiga like toxin producing E coli (STEC) NOT DETECTED NOT DETECTED Final   Shigella/Enteroinvasive E coli (EIEC) NOT DETECTED NOT DETECTED Final   Cryptosporidium NOT DETECTED NOT DETECTED Final   Cyclospora cayetanensis NOT DETECTED NOT DETECTED Final   Entamoeba histolytica NOT DETECTED NOT DETECTED Final   Giardia lamblia NOT DETECTED NOT DETECTED Final   Adenovirus F40/41 NOT DETECTED NOT DETECTED Final   Astrovirus NOT DETECTED NOT DETECTED Final   Norovirus GI/GII NOT DETECTED NOT DETECTED Final   Rotavirus A NOT DETECTED NOT DETECTED Final   Sapovirus (I, II, IV, and V) NOT DETECTED NOT DETECTED Final    Comment: Performed at [facility], [address] [place], [place] [address]  C Difficile Quick Screen w PCR reflex     Status: None   Collection Time: 10/12/19 10:25 PM   Specimen: STOOL  Result Value Ref Range Status   C Diff antigen NEGATIVE NEGATIVE Final   C Diff toxin NEGATIVE NEGATIVE Final   C Diff interpretation No C. difficile detected.  Final    Comment: Performed at [facility], [place] [address] [place], [place] [address]  SARS  CORONAVIRUS 2 (TAT 6-24 HRS)     Status: None   Collection Time: 10/12/19 11:53 PM  Result Value Ref Range Status   SARS Coronavirus 2 NEGATIVE NEGATIVE Final    Comment: (NOTE) SARS-CoV-2 target nucleic acids are NOT DETECTED. The SARS-CoV-2 RNA is generally detectable in upper and lower respiratory specimens during the acute phase of infection. Negative results do not preclude SARS-CoV-2 infection, do not rule out co-infections with other pathogens, and should not be used as the sole basis for treatment or other patient management decisions. Negative results must be combined with clinical observations, patient history, and epidemiological information. The expected result is Negative. Fact Sheet for Patients: SugarRoll.be Fact Sheet for Healthcare Providers: https://www.woods-mathews.com/ This test is not yet approved or cleared by the [place] FDA and  has been authorized for detection and/or diagnosis of SARS-CoV-2 by FDA under an Emergency Use Authorization (EUA). This EUA will remain  in effect (meaning this test can be used) for the duration of the COVID-19 declaration under Section 56 4(b)(1) of the Act, 21 U.S.C. section 360bbb-3(b)(1), unless the authorization is terminated or revoked sooner. Performed at [facility], [place] [address], [place], [place] [address]   Blood  culture (routine x 2)     Status: None (Preliminary result)   Collection Time: 10/13/19  4:08 AM   Specimen: BLOOD  Result Value Ref Range Status   Specimen Description BLOOD RIGHT ARM  Final   Special Requests   Final    BOTTLES DRAWN AEROBIC AND ANAEROBIC Blood Culture adequate volume   Culture   Final    NO GROWTH 1 DAY Performed at [facility], [place] [address] [place], [place] [address]    Report Status PENDING  Incomplete     Time coordinating discharge: 35 minutes  SIGNED:   Aline August, MD  [facility] 10/14/2019, 2:14  PM

## 2019-10-14 NOTE — TOC Transition Note (Signed)
Transition of Care [facility]) - CM/SW Discharge Note   Patient Details  Name: Caitlin Peters MRN: [id number] Date of Birth: 1961-05-09  [facility] CM/SW Contact:  Benard Halsted, LCSW Phone Number: 10/14/2019, 12:56 PM   Clinical Narrative:    Patient was provided with [place] list and has accepted [facility] ([facility] unable to accept due to low staffing). [facility] able to accept the referral for RN visits. Patient in agreement to establish primary care at [facility] and has an appointment for tomorrow, which her daughter has agreed to take her to. No other needs identified at this time.     Final next level of care: Home w Home Health Services Barriers to Discharge: No Barriers Identified   Patient Goals and CMS Choice   CMS Medicare.gov Compare Post Acute Care list provided to:: Patient Choice offered to / list presented to : Patient  Discharge Placement                       Discharge Plan and Services In-house Referral: Clinical Social Work Discharge Planning Services: CM Consult Post Acute Care Choice: Home Health          DME Arranged: N/A         HH Arranged: RN [place] Agency: [facility] Date [place]: 10/14/19 Time [place]: 5465 Representative spoke with at [place]: Cassie  Social Determinants of Health ([place]) Interventions     Readmission Risk Interventions No flowsheet data found.

## 2019-10-15 ENCOUNTER — Ambulatory Visit: Payer: Medicare Other | Admitting: Internal Medicine

## 2019-10-16 ENCOUNTER — Other Ambulatory Visit: Payer: Self-pay

## 2019-10-16 ENCOUNTER — Encounter: Payer: Self-pay | Admitting: Gastroenterology

## 2019-10-16 ENCOUNTER — Ambulatory Visit (INDEPENDENT_AMBULATORY_CARE_PROVIDER_SITE_OTHER): Payer: Medicare Other | Admitting: Internal Medicine

## 2019-10-16 ENCOUNTER — Encounter: Payer: Self-pay | Admitting: Internal Medicine

## 2019-10-16 VITALS — BP 150/80 | HR 79 | Temp 98.2°F | Wt 130.2 lb

## 2019-10-16 DIAGNOSIS — K50818 Crohn's disease of both small and large intestine with other complication: Secondary | ICD-10-CM

## 2019-10-16 DIAGNOSIS — F329 Major depressive disorder, single episode, unspecified: Secondary | ICD-10-CM | POA: Insufficient documentation

## 2019-10-16 DIAGNOSIS — H539 Unspecified visual disturbance: Secondary | ICD-10-CM

## 2019-10-16 DIAGNOSIS — K912 Postsurgical malabsorption, not elsewhere classified: Secondary | ICD-10-CM | POA: Diagnosis not present

## 2019-10-16 DIAGNOSIS — K529 Noninfective gastroenteritis and colitis, unspecified: Secondary | ICD-10-CM

## 2019-10-16 DIAGNOSIS — E876 Hypokalemia: Secondary | ICD-10-CM

## 2019-10-16 DIAGNOSIS — E46 Unspecified protein-calorie malnutrition: Secondary | ICD-10-CM | POA: Insufficient documentation

## 2019-10-16 DIAGNOSIS — I10 Essential (primary) hypertension: Secondary | ICD-10-CM | POA: Diagnosis not present

## 2019-10-16 DIAGNOSIS — M816 Localized osteoporosis [Lequesne]: Secondary | ICD-10-CM

## 2019-10-16 DIAGNOSIS — E538 Deficiency of other specified B group vitamins: Secondary | ICD-10-CM

## 2019-10-16 DIAGNOSIS — F3341 Major depressive disorder, recurrent, in partial remission: Secondary | ICD-10-CM

## 2019-10-16 DIAGNOSIS — R600 Localized edema: Secondary | ICD-10-CM

## 2019-10-16 DIAGNOSIS — M810 Age-related osteoporosis without current pathological fracture: Secondary | ICD-10-CM | POA: Insufficient documentation

## 2019-10-16 DIAGNOSIS — K219 Gastro-esophageal reflux disease without esophagitis: Secondary | ICD-10-CM

## 2019-10-16 DIAGNOSIS — E43 Unspecified severe protein-calorie malnutrition: Secondary | ICD-10-CM

## 2019-10-16 DIAGNOSIS — G894 Chronic pain syndrome: Secondary | ICD-10-CM

## 2019-10-16 DIAGNOSIS — K90829 Short bowel syndrome, unspecified: Secondary | ICD-10-CM

## 2019-10-16 DIAGNOSIS — Z95828 Presence of other vascular implants and grafts: Secondary | ICD-10-CM

## 2019-10-16 DIAGNOSIS — K5090 Crohn's disease, unspecified, without complications: Secondary | ICD-10-CM | POA: Insufficient documentation

## 2019-10-16 DIAGNOSIS — K50918 Crohn's disease, unspecified, with other complication: Secondary | ICD-10-CM | POA: Diagnosis not present

## 2019-10-16 LAB — CBC WITH DIFFERENTIAL/PLATELET
Basophils Absolute: 0 10*3/uL (ref 0.0–0.1)
Basophils Relative: 0.3 % (ref 0.0–3.0)
Eosinophils Absolute: 0 10*3/uL (ref 0.0–0.7)
Eosinophils Relative: 0.3 % (ref 0.0–5.0)
HCT: 26 % — ABNORMAL LOW (ref 36.0–46.0)
Hemoglobin: 8.6 g/dL — ABNORMAL LOW (ref 12.0–15.0)
Lymphocytes Relative: 19.3 % (ref 12.0–46.0)
Lymphs Abs: 1 10*3/uL (ref 0.7–4.0)
MCHC: 33.2 g/dL (ref 30.0–36.0)
MCV: 92.3 fl (ref 78.0–100.0)
Monocytes Absolute: 0.3 10*3/uL (ref 0.1–1.0)
Monocytes Relative: 5.9 % (ref 3.0–12.0)
Neutro Abs: 3.7 10*3/uL (ref 1.4–7.7)
Neutrophils Relative %: 74.2 % (ref 43.0–77.0)
Platelets: 162 10*3/uL (ref 150.0–400.0)
RBC: 2.82 Mil/uL — ABNORMAL LOW (ref 3.87–5.11)
RDW: 15.4 % (ref 11.5–15.5)
WBC: 5 10*3/uL (ref 4.0–10.5)

## 2019-10-16 LAB — COMPREHENSIVE METABOLIC PANEL
ALT: 22 U/L (ref 0–35)
AST: 13 U/L (ref 0–37)
Albumin: 3.5 g/dL (ref 3.5–5.2)
Alkaline Phosphatase: 186 U/L — ABNORMAL HIGH (ref 39–117)
BUN: 20 mg/dL (ref 6–23)
CO2: 18 mEq/L — ABNORMAL LOW (ref 19–32)
Calcium: 8.7 mg/dL (ref 8.4–10.5)
Chloride: 111 mEq/L (ref 96–112)
Creatinine, Ser: 1.56 mg/dL — ABNORMAL HIGH (ref 0.40–1.20)
GFR: 41.16 mL/min — ABNORMAL LOW (ref >60.00)
Glucose, Bld: 80 mg/dL (ref 70–99)
Potassium: 3.8 mEq/L (ref 3.5–5.1)
Sodium: 139 mEq/L (ref 135–145)
Total Bilirubin: 0.5 mg/dL (ref 0.2–1.2)
Total Protein: 6.2 g/dL (ref 6.0–8.3)

## 2019-10-16 LAB — TSH: TSH: 0.54 u[IU]/mL (ref 0.35–4.50)

## 2019-10-16 LAB — VITAMIN B12: Vitamin B-12: 1078 pg/mL — ABNORMAL HIGH (ref 211–911)

## 2019-10-16 LAB — VITAMIN D 25 HYDROXY (VIT D DEFICIENCY, FRACTURES): VITD: 53.6 ng/mL (ref 30.00–100.00)

## 2019-10-16 MED ORDER — AMLODIPINE BESYLATE 5 MG PO TABS: 5.0000 mg | ORAL_TABLET | Freq: Every day | ORAL | 1 refills | Status: DC

## 2019-10-16 MED ORDER — DEXILANT 60 MG PO CPDR: 60.0000 mg | DELAYED_RELEASE_CAPSULE | Freq: Every day | ORAL | 1 refills | Status: DC

## 2019-10-16 MED ORDER — PROMETHAZINE HCL 25 MG PO TABS: 25.0000 mg | ORAL_TABLET | Freq: Four times a day (QID) | ORAL | 0 refills | Status: DC | PRN

## 2019-10-16 MED ORDER — POTASSIUM CHLORIDE CRYS ER 20 MEQ PO TBCR: 40.0000 meq | EXTENDED_RELEASE_TABLET | Freq: Every day | ORAL | 1 refills | Status: DC

## 2019-10-16 MED ORDER — LOPERAMIDE HCL 2 MG PO CAPS: 2.0000 mg | ORAL_CAPSULE | ORAL | 0 refills | Status: DC | PRN

## 2019-10-16 NOTE — Patient Instructions (Signed)
-  Nice seeing you today!!'  -Lab work today; will notify you once results are available.  -Schedule follow up in 3 months or sooner as needed.

## 2019-10-16 NOTE — Progress Notes (Signed)
New Patient Office Visit     This visit occurred during the SARS-CoV-2 public health emergency.  Safety protocols were in place, including screening questions prior to the visit, additional usage of staff PPE, and extensive cleaning of exam room while observing appropriate contact time as indicated for disinfecting solutions.    CC/Reason for Visit: Establish care, discuss chronic medical conditions, medication refills Previous PCP: in [place] Last Visit: 2 months ago aprox  HPI: Caitlin Peters is a 58 y.o. female who is coming in today for the above mentioned reasons. Her daughter is with her today. She has moved from [place] to be with her daughter due to her chronic illnesses.  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 She has developed chronic diarrhea, malabsorption and severe protein-caloric malnutrition. She has been receiving TNA since 2003. Her course has been further complicated my multiple hospital admissions for sepsis and line infections. Just prior to her moving to [place], she had a left femoral tunneled PICC placed. "I have no veins left in my body". As of last week she has made the decision to "never go back on TNA again". She frequently has electrolyte deficiencies (hypoMg and hypoK) related to her chronic diarrhea. States "Crohn's has damaged my eyes too". She also has a h/o ??hypothyroidism (she was told she needed medications for a thyroid problem but she left [place] before they were started). Also has a h/o HTN, osteoporosis, vit B12 deficiency, GERD and depression.  She also has chronic pain related to Crohn's and was being managed by a pain clinic on methadone and hydromorphone. She needs refills of several of her medications today, also requesting supplies to assist with PICC care at home.   Past Medical/Surgical History: Past Medical History:   Diagnosis Date  . Chronic pain syndrome   . Crohn disease ([place])   . Depression   . GERD (gastroesophageal reflux disease)   . HTN (hypertension)   . Malnutrition ([place])   . Osteoporosis   . Short gut syndrome   . Vitamin B12 deficiency     Past Surgical History:  Procedure Laterality Date  . ABDOMINAL SURGERY      Social History:  reports that she has quit smoking. She has never used smokeless tobacco. She reports previous alcohol use. No history on file for drug.  Allergies: Allergies  Allergen Reactions  . Gabapentin Other (See Comments)    unknown  . Lyrica [Pregabalin] Other (See Comments)    unknown  . Topamax [Topiramate] Other (See Comments)    unknown  . Fentanyl Rash  . Morphine And Related Rash    Family History:  No family history of heart disease, cancer, stroke that she is aware of.   Current Outpatient Medications:  .  acetaminophen (TYLENOL) 325 MG tablet, Take 650 mg by mouth every 6 (six) hours as needed., Disp: , Rfl:  .  amLODipine (NORVASC) 5 MG tablet, Take 1 tablet (5 mg total) by mouth daily., Disp: 90 tablet, Rfl: 1 .  buPROPion (WELLBUTRIN SR) 100 MG 12 hr tablet, Take 100 mg by mouth 2 (two) times daily., Disp: , Rfl:  .  cycloSPORINE (RESTASIS) 0.05 % ophthalmic emulsion, Place 1 drop into both eyes 2 (two) times daily., Disp: , Rfl:  .  dexlansoprazole (DEXILANT) 60 MG capsule, Take 1 capsule (60 mg total) by mouth daily., Disp: 90 capsule, Rfl: 1 .  dicyclomine (BENTYL) 10 MG capsule, Take 10 mg by mouth 3 (three) times daily before meals., Disp: , Rfl:  .  estradiol (ESTRACE) 2 MG tablet, Take 2 mg by mouth daily., Disp: , Rfl:  .  HYDROmorphone (DILAUDID) 4 MG tablet, Take 4 mg by mouth every 6 (six) hours as needed for moderate pain or severe pain., Disp: , Rfl:  .  methadone (DOLOPHINE) 10 MG tablet, Take 20 mg by mouth every 12 (twelve) hours., Disp: , Rfl:  .  metoprolol tartrate (LOPRESSOR) 100 MG tablet, Take 100 mg by mouth 2 (two)  times daily., Disp: , Rfl:  .  potassium chloride SA (KLOR-CON) 20 MEQ tablet, Take 2 tablets (40 mEq total) by mouth daily., Disp: 90 tablet, Rfl: 1 .  promethazine (PHENERGAN) 25 MG tablet, Take 1 tablet (25 mg total) by mouth every 6 (six) hours as needed for nausea or vomiting., Disp: 90 tablet, Rfl: 0 .  loperamide (IMODIUM) 2 MG capsule, Take 1 capsule (2 mg total) by mouth as needed for diarrhea or loose stools., Disp: 90 capsule, Rfl: 0  Review of Systems:  Constitutional: Denies fever, chills, diaphoresis. She is always fatigued but this is not new. HEENT: Denies photophobia, eye pain, redness, hearing loss, ear pain, congestion, sore throat, rhinorrhea, sneezing, mouth sores, trouble swallowing, neck pain, neck stiffness and tinnitus.   Respiratory: Denies SOB, DOE, cough, chest tightness,  and wheezing.   Cardiovascular: Denies chest pain, palpitations and leg swelling.  Gastrointestinal: Denies nausea, vomiting, constipation, blood in stool and abdominal distention.  Genitourinary: Denies dysuria, urgency, frequency, hematuria, flank pain and difficulty urinating.  Endocrine: Denies: hot or cold intolerance, sweats, changes in hair or nails, polyuria, polydipsia. Musculoskeletal: Denies myalgias, back pain, joint swelling, arthralgias and gait problem.  Skin: Denies pallor, rash and wound.  Neurological: Denies dizziness, seizures, syncope, weakness, light-headedness, numbness and headaches.  Hematological: Denies adenopathy. Easy bruising, personal or family bleeding history  Psychiatric/Behavioral: Denies suicidal ideation, mood changes, confusion, nervousness, sleep disturbance and agitation    Physical Exam: Vitals:   10/16/19 1419  BP: (!) 150/80  Pulse: 79  Temp: 98.2 F (36.8 C)  TempSrc: Temporal  SpO2: 97%  Weight: 130 lb 3.2 oz (59.1 kg)     Constitutional: NAD, calm, comfortable Eyes: PERRL, lids and conjunctivae normal ENMT: Mucous membranes are moist.   Respiratory: clear to auscultation bilaterally, no wheezing, no crackles. Normal respiratory effort. No accessory muscle use.  Cardiovascular: Regular rate and rhythm, no murmurs / rubs / gallops. No extremity edema. 2+ pedal pulses. No carotid bruits.  Abdomen: no tenderness, no masses palpated. No hepatosplenomegaly. Bowel sounds positive.  Musculoskeletal: no clubbing / cyanosis. No joint deformity upper and lower extremities. Good ROM, no contractures. Normal muscle tone.  Skin: no rashes, lesions, ulcers. No induration. Tunneled catheter in left femoral artery is clean, no secretions. Neurologic: grossly intact and nonfocal  Psychiatric: Normal judgment and insight. Alert and oriented x 3. Normal mood.    Impression and Plan:  Crohn's disease with other complication, unspecified gastrointestinal tract location [facility])  Short gut syndrome Status post peripherally inserted central catheter (PICC) central line placement. Hypokalemia  Severe protein-calorie malnutrition ([name]) Vitamin B12 deficiency Chronic Diarrhea  -GI referral, dietitian, refill meds, check labs. -HHRN for line care. -She wants to try and stay off TNA for now.  Bilateral lower extremity edema -Likely due to decreased oncotic pressure and hypoalbuminemia due to malabsorption syndrome. -Advised high protein diet and dietitian referral.  Chronic pain syndrome -  On methadone and hydromorphone.  - Plan: Ambulatory referral to Pain Clinic  Vision changes  - Plan: Ambulatory referral to Ophthalmology  Localized osteoporosis without current pathological fracture -Find prior DEXA results, not currently on meds.  Essential hypertension  -BP elevated in office today. -Advised ambulatory BP monitoring and will address at follow up visits.    Patient Instructions  -Nice seeing you today!!'  -Lab work today; will notify you once results are available.  -Schedule follow up in 3 months or sooner as needed.      [name], MD [facility]

## 2019-10-17 LAB — CULTURE, BLOOD (ROUTINE X 2): Culture: NO GROWTH

## 2019-10-18 LAB — CULTURE, BLOOD (ROUTINE X 2)
Culture: NO GROWTH
Special Requests: ADEQUATE

## 2019-10-19 ENCOUNTER — Telehealth: Payer: Self-pay | Admitting: Internal Medicine

## 2019-10-19 DIAGNOSIS — K50918 Crohn's disease, unspecified, with other complication: Secondary | ICD-10-CM

## 2019-10-19 DIAGNOSIS — E43 Unspecified severe protein-calorie malnutrition: Secondary | ICD-10-CM

## 2019-10-19 DIAGNOSIS — K50818 Crohn's disease of both small and large intestine with other complication: Secondary | ICD-10-CM

## 2019-10-19 DIAGNOSIS — K912 Postsurgical malabsorption, not elsewhere classified: Secondary | ICD-10-CM

## 2019-10-19 DIAGNOSIS — Z95828 Presence of other vascular implants and grafts: Secondary | ICD-10-CM

## 2019-10-19 NOTE — Telephone Encounter (Signed)
Mo with [facility] called to state pt needs order for infusions and any other labs she may need sent to [facility]. Stated pt gets them daily and only has 4 left. Please advise.

## 2019-10-19 NOTE — Telephone Encounter (Signed)
Caitlin Peters called to add that A medication interaction was flagged in their system for Methadone and Trazadone and also between the Pts potassium chloride and dicyclomine (BENTYL / please advise

## 2019-10-20 NOTE — Telephone Encounter (Signed)
potassium chloride and dicyclomine (BENTYL) was just and [employer] with patient about what supplies are needed for her pic line.  Fax sent to [facility] with supplies needed for pic line.  Mo with [facility] is aware.

## 2019-10-20 NOTE — Telephone Encounter (Signed)
Mo with [facility] would like a callback as soon as possible at [phone] in regards to patient only having 2 days left of medical supplies and will be out for the holiday.

## 2019-10-20 NOTE — Telephone Encounter (Signed)
Not sure what infusions and supplies they are referring to? Ok to order any medical supplies they are requesting. Medication interaction noted. Those will be prescribed by pain management.

## 2019-10-20 NOTE — Telephone Encounter (Signed)
Never filled by Dr Jerilee Hoh.  Okay to refill?

## 2019-10-21 NOTE — Telephone Encounter (Signed)
Mo with [facility] would like a call back from Dr Jerilee Hoh   Call back [phone]

## 2019-10-21 NOTE — Telephone Encounter (Signed)
Debbie with [facility] calling.  States that she is returning a call to [place] about supplies for pt.

## 2019-10-26 NOTE — Telephone Encounter (Signed)
Called Mo. No  Answer. LVM for a return call

## 2019-10-27 ENCOUNTER — Inpatient Hospital Stay (HOSPITAL_COMMUNITY)
Admission: EM | Admit: 2019-10-27 | Discharge: 2019-10-31 | DRG: 641 | Disposition: A | Discharge status: Home Health | Payer: Medicare Other | Attending: Internal Medicine | Admitting: Internal Medicine

## 2019-10-27 ENCOUNTER — Other Ambulatory Visit: Payer: Self-pay

## 2019-10-27 ENCOUNTER — Encounter (HOSPITAL_COMMUNITY): Payer: Self-pay | Admitting: Emergency Medicine

## 2019-10-27 DIAGNOSIS — K50818 Crohn's disease of both small and large intestine with other complication: Secondary | ICD-10-CM | POA: Diagnosis present

## 2019-10-27 DIAGNOSIS — K219 Gastro-esophageal reflux disease without esophagitis: Secondary | ICD-10-CM | POA: Diagnosis present

## 2019-10-27 DIAGNOSIS — D649 Anemia, unspecified: Secondary | ICD-10-CM | POA: Diagnosis present

## 2019-10-27 DIAGNOSIS — K5010 Crohn's disease of large intestine without complications: Secondary | ICD-10-CM | POA: Diagnosis present

## 2019-10-27 DIAGNOSIS — Z79891 Long term (current) use of opiate analgesic: Secondary | ICD-10-CM

## 2019-10-27 DIAGNOSIS — D509 Iron deficiency anemia, unspecified: Secondary | ICD-10-CM | POA: Diagnosis present

## 2019-10-27 DIAGNOSIS — D692 Other nonthrombocytopenic purpura: Secondary | ICD-10-CM | POA: Diagnosis present

## 2019-10-27 DIAGNOSIS — Z9049 Acquired absence of other specified parts of digestive tract: Secondary | ICD-10-CM

## 2019-10-27 DIAGNOSIS — Z87891 Personal history of nicotine dependence: Secondary | ICD-10-CM

## 2019-10-27 DIAGNOSIS — R601 Generalized edema: Secondary | ICD-10-CM

## 2019-10-27 DIAGNOSIS — G894 Chronic pain syndrome: Secondary | ICD-10-CM | POA: Diagnosis present

## 2019-10-27 DIAGNOSIS — R296 Repeated falls: Secondary | ICD-10-CM | POA: Diagnosis present

## 2019-10-27 DIAGNOSIS — Z888 Allergy status to other drugs, medicaments and biological substances status: Secondary | ICD-10-CM

## 2019-10-27 DIAGNOSIS — K912 Postsurgical malabsorption, not elsewhere classified: Secondary | ICD-10-CM | POA: Diagnosis present

## 2019-10-27 DIAGNOSIS — I129 Hypertensive chronic kidney disease with stage 1 through stage 4 chronic kidney disease, or unspecified chronic kidney disease: Secondary | ICD-10-CM | POA: Diagnosis present

## 2019-10-27 DIAGNOSIS — K5090 Crohn's disease, unspecified, without complications: Secondary | ICD-10-CM | POA: Diagnosis present

## 2019-10-27 DIAGNOSIS — E46 Unspecified protein-calorie malnutrition: Secondary | ICD-10-CM | POA: Diagnosis not present

## 2019-10-27 DIAGNOSIS — Z79899 Other long term (current) drug therapy: Secondary | ICD-10-CM

## 2019-10-27 DIAGNOSIS — N1832 Chronic kidney disease, stage 3b: Secondary | ICD-10-CM | POA: Diagnosis present

## 2019-10-27 DIAGNOSIS — R509 Fever, unspecified: Secondary | ICD-10-CM | POA: Diagnosis present

## 2019-10-27 DIAGNOSIS — K529 Noninfective gastroenteritis and colitis, unspecified: Secondary | ICD-10-CM | POA: Diagnosis present

## 2019-10-27 DIAGNOSIS — I10 Essential (primary) hypertension: Secondary | ICD-10-CM | POA: Diagnosis present

## 2019-10-27 DIAGNOSIS — Z885 Allergy status to narcotic agent status: Secondary | ICD-10-CM

## 2019-10-27 DIAGNOSIS — W19XXXA Unspecified fall, initial encounter: Secondary | ICD-10-CM | POA: Diagnosis present

## 2019-10-27 DIAGNOSIS — F329 Major depressive disorder, single episode, unspecified: Secondary | ICD-10-CM | POA: Diagnosis present

## 2019-10-27 DIAGNOSIS — N179 Acute kidney failure, unspecified: Secondary | ICD-10-CM | POA: Diagnosis present

## 2019-10-27 DIAGNOSIS — M810 Age-related osteoporosis without current pathological fracture: Secondary | ICD-10-CM | POA: Diagnosis present

## 2019-10-27 DIAGNOSIS — Z6820 Body mass index (BMI) 20.0-20.9, adult: Secondary | ICD-10-CM

## 2019-10-27 DIAGNOSIS — R809 Proteinuria, unspecified: Secondary | ICD-10-CM

## 2019-10-27 DIAGNOSIS — Z95828 Presence of other vascular implants and grafts: Secondary | ICD-10-CM

## 2019-10-27 DIAGNOSIS — E876 Hypokalemia: Secondary | ICD-10-CM | POA: Diagnosis present

## 2019-10-27 DIAGNOSIS — Z90710 Acquired absence of both cervix and uterus: Secondary | ICD-10-CM

## 2019-10-27 DIAGNOSIS — Z20828 Contact with and (suspected) exposure to other viral communicable diseases: Secondary | ICD-10-CM | POA: Diagnosis present

## 2019-10-27 DIAGNOSIS — N189 Chronic kidney disease, unspecified: Secondary | ICD-10-CM | POA: Diagnosis present

## 2019-10-27 DIAGNOSIS — D638 Anemia in other chronic diseases classified elsewhere: Secondary | ICD-10-CM | POA: Diagnosis present

## 2019-10-27 DIAGNOSIS — K50819 Crohn's disease of both small and large intestine with unspecified complications: Secondary | ICD-10-CM | POA: Diagnosis present

## 2019-10-27 HISTORY — DX: Chronic kidney disease, stage 3 unspecified: N18.30

## 2019-10-27 HISTORY — DX: Generalized edema: R60.1

## 2019-10-27 MED ORDER — SODIUM CHLORIDE 0.9% FLUSH: 3.0000 mL | Freq: Once | INTRAVENOUS | Status: DC

## 2019-10-27 NOTE — ED Triage Notes (Signed)
Pt from home with ems c.o bilateral leg swelling for the past week and she woke up this morning with bilateral eye swelling. Hx of crohns. Denies abd pain n/v/d. Pt a.o

## 2019-10-27 NOTE — ED Notes (Signed)
Patient denies to be stuck. Patient wants blood work drawn from her L femoral PICC line.

## 2019-10-27 NOTE — Telephone Encounter (Signed)
Mo returned call

## 2019-10-28 ENCOUNTER — Emergency Department (HOSPITAL_COMMUNITY): Payer: Medicare Other

## 2019-10-28 DIAGNOSIS — G894 Chronic pain syndrome: Secondary | ICD-10-CM

## 2019-10-28 DIAGNOSIS — D692 Other nonthrombocytopenic purpura: Secondary | ICD-10-CM | POA: Diagnosis present

## 2019-10-28 DIAGNOSIS — K912 Postsurgical malabsorption, not elsewhere classified: Secondary | ICD-10-CM | POA: Diagnosis present

## 2019-10-28 DIAGNOSIS — F329 Major depressive disorder, single episode, unspecified: Secondary | ICD-10-CM | POA: Diagnosis present

## 2019-10-28 DIAGNOSIS — I10 Essential (primary) hypertension: Secondary | ICD-10-CM

## 2019-10-28 DIAGNOSIS — D649 Anemia, unspecified: Secondary | ICD-10-CM

## 2019-10-28 DIAGNOSIS — Z90710 Acquired absence of both cervix and uterus: Secondary | ICD-10-CM | POA: Diagnosis not present

## 2019-10-28 DIAGNOSIS — K529 Noninfective gastroenteritis and colitis, unspecified: Secondary | ICD-10-CM

## 2019-10-28 DIAGNOSIS — Z79891 Long term (current) use of opiate analgesic: Secondary | ICD-10-CM | POA: Diagnosis not present

## 2019-10-28 DIAGNOSIS — K219 Gastro-esophageal reflux disease without esophagitis: Secondary | ICD-10-CM | POA: Diagnosis present

## 2019-10-28 DIAGNOSIS — Z87891 Personal history of nicotine dependence: Secondary | ICD-10-CM | POA: Diagnosis not present

## 2019-10-28 DIAGNOSIS — W19XXXA Unspecified fall, initial encounter: Secondary | ICD-10-CM | POA: Diagnosis present

## 2019-10-28 DIAGNOSIS — N179 Acute kidney failure, unspecified: Secondary | ICD-10-CM | POA: Diagnosis present

## 2019-10-28 DIAGNOSIS — Z888 Allergy status to other drugs, medicaments and biological substances status: Secondary | ICD-10-CM | POA: Diagnosis not present

## 2019-10-28 DIAGNOSIS — Z885 Allergy status to narcotic agent status: Secondary | ICD-10-CM | POA: Diagnosis not present

## 2019-10-28 DIAGNOSIS — E46 Unspecified protein-calorie malnutrition: Secondary | ICD-10-CM | POA: Diagnosis present

## 2019-10-28 DIAGNOSIS — R601 Generalized edema: Secondary | ICD-10-CM | POA: Diagnosis present

## 2019-10-28 DIAGNOSIS — R296 Repeated falls: Secondary | ICD-10-CM | POA: Diagnosis present

## 2019-10-28 DIAGNOSIS — Z9049 Acquired absence of other specified parts of digestive tract: Secondary | ICD-10-CM | POA: Diagnosis not present

## 2019-10-28 DIAGNOSIS — M810 Age-related osteoporosis without current pathological fracture: Secondary | ICD-10-CM | POA: Diagnosis present

## 2019-10-28 DIAGNOSIS — E43 Unspecified severe protein-calorie malnutrition: Secondary | ICD-10-CM

## 2019-10-28 DIAGNOSIS — Z79899 Other long term (current) drug therapy: Secondary | ICD-10-CM | POA: Diagnosis not present

## 2019-10-28 DIAGNOSIS — N1832 Chronic kidney disease, stage 3b: Secondary | ICD-10-CM | POA: Diagnosis present

## 2019-10-28 DIAGNOSIS — I129 Hypertensive chronic kidney disease with stage 1 through stage 4 chronic kidney disease, or unspecified chronic kidney disease: Secondary | ICD-10-CM | POA: Diagnosis present

## 2019-10-28 DIAGNOSIS — E876 Hypokalemia: Secondary | ICD-10-CM | POA: Diagnosis present

## 2019-10-28 DIAGNOSIS — R809 Proteinuria, unspecified: Secondary | ICD-10-CM | POA: Diagnosis present

## 2019-10-28 DIAGNOSIS — D638 Anemia in other chronic diseases classified elsewhere: Secondary | ICD-10-CM | POA: Diagnosis present

## 2019-10-28 DIAGNOSIS — K5090 Crohn's disease, unspecified, without complications: Secondary | ICD-10-CM | POA: Diagnosis present

## 2019-10-28 DIAGNOSIS — K50818 Crohn's disease of both small and large intestine with other complication: Secondary | ICD-10-CM

## 2019-10-28 DIAGNOSIS — N189 Chronic kidney disease, unspecified: Secondary | ICD-10-CM

## 2019-10-28 DIAGNOSIS — Z20828 Contact with and (suspected) exposure to other viral communicable diseases: Secondary | ICD-10-CM | POA: Diagnosis present

## 2019-10-28 LAB — HEMOGLOBIN A1C
Hgb A1c MFr Bld: 4.7 % — ABNORMAL LOW (ref 4.8–5.6)
Mean Plasma Glucose: 88.19 mg/dL

## 2019-10-28 LAB — URINALYSIS, ROUTINE W REFLEX MICROSCOPIC
Bilirubin Urine: NEGATIVE
Glucose, UA: NEGATIVE mg/dL
Hgb urine dipstick: NEGATIVE
Ketones, ur: NEGATIVE mg/dL
Nitrite: NEGATIVE
Protein, ur: 30 mg/dL — AB
Specific Gravity, Urine: 1.017 (ref 1.005–1.030)
pH: 5 (ref 5.0–8.0)

## 2019-10-28 LAB — SODIUM, URINE, RANDOM: Sodium, Ur: 41 mmol/L

## 2019-10-28 LAB — COMPREHENSIVE METABOLIC PANEL
ALT: 104 U/L — ABNORMAL HIGH (ref 0–44)
AST: 28 U/L (ref 15–41)
Albumin: 2.8 g/dL — ABNORMAL LOW (ref 3.5–5.0)
Alkaline Phosphatase: 180 U/L — ABNORMAL HIGH (ref 38–126)
Anion gap: 12 (ref 5–15)
BUN: 31 mg/dL — ABNORMAL HIGH (ref 6–20)
CO2: 16 mmol/L — ABNORMAL LOW (ref 22–32)
Calcium: 8.3 mg/dL — ABNORMAL LOW (ref 8.9–10.3)
Chloride: 113 mmol/L — ABNORMAL HIGH (ref 98–111)
Creatinine, Ser: 2.1 mg/dL — ABNORMAL HIGH (ref 0.44–1.00)
GFR calc Af Amer: 29 mL/min — ABNORMAL LOW (ref >60)
GFR calc non Af Amer: 25 mL/min — ABNORMAL LOW (ref >60)
Glucose, Bld: 109 mg/dL — ABNORMAL HIGH (ref 70–99)
Potassium: 3.1 mmol/L — ABNORMAL LOW (ref 3.5–5.1)
Sodium: 141 mmol/L (ref 135–145)
Total Bilirubin: 0.4 mg/dL (ref 0.3–1.2)
Total Protein: 6.1 g/dL — ABNORMAL LOW (ref 6.5–8.1)

## 2019-10-28 LAB — CBC WITH DIFFERENTIAL/PLATELET
Abs Immature Granulocytes: 0.01 10*3/uL (ref 0.00–0.07)
Basophils Absolute: 0 10*3/uL (ref 0.0–0.1)
Basophils Relative: 0 %
Eosinophils Absolute: 0 10*3/uL (ref 0.0–0.5)
Eosinophils Relative: 1 %
HCT: 24.9 % — ABNORMAL LOW (ref 36.0–46.0)
Hemoglobin: 7.9 g/dL — ABNORMAL LOW (ref 12.0–15.0)
Immature Granulocytes: 0 %
Lymphocytes Relative: 33 %
Lymphs Abs: 1.4 10*3/uL (ref 0.7–4.0)
MCH: 30.3 pg (ref 26.0–34.0)
MCHC: 31.7 g/dL (ref 30.0–36.0)
MCV: 95.4 fL (ref 80.0–100.0)
Monocytes Absolute: 0.5 10*3/uL (ref 0.1–1.0)
Monocytes Relative: 12 %
Neutro Abs: 2.2 10*3/uL (ref 1.7–7.7)
Neutrophils Relative %: 54 %
Platelets: 232 10*3/uL (ref 150–400)
RBC: 2.61 MIL/uL — ABNORMAL LOW (ref 3.87–5.11)
RDW: 14.7 % (ref 11.5–15.5)
WBC: 4.1 10*3/uL (ref 4.0–10.5)
nRBC: 0 % (ref 0.0–0.2)

## 2019-10-28 LAB — BRAIN NATRIURETIC PEPTIDE: B Natriuretic Peptide: 162.4 pg/mL — ABNORMAL HIGH (ref 0.0–100.0)

## 2019-10-28 LAB — CBG MONITORING, ED: Glucose-Capillary: 114 mg/dL — ABNORMAL HIGH (ref 70–99)

## 2019-10-28 LAB — MAGNESIUM
Magnesium: 0.8 mg/dL — CL (ref 1.7–2.4)
Magnesium: 1.7 mg/dL (ref 1.7–2.4)

## 2019-10-28 LAB — PHOSPHORUS
Phosphorus: 3.9 mg/dL (ref 2.5–4.6)
Phosphorus: 1.6 mg/dL — ABNORMAL LOW (ref 2.5–4.6)

## 2019-10-28 LAB — SARS CORONAVIRUS 2 (TAT 6-24 HRS): SARS Coronavirus 2: NEGATIVE

## 2019-10-28 LAB — CREATININE, URINE, RANDOM: Creatinine, Urine: 129.81 mg/dL

## 2019-10-28 MED ORDER — LOPERAMIDE HCL 2 MG PO CAPS
2.0000 mg | ORAL_CAPSULE | ORAL | Status: DC | PRN
  Administered 2019-10-29: 2 mg via ORAL
  Filled 2019-10-28: qty 1

## 2019-10-28 MED ORDER — AMLODIPINE BESYLATE 5 MG PO TABS
5.0000 mg | ORAL_TABLET | Freq: Every day | ORAL | Status: DC
  Administered 2019-10-28 – 2019-10-31 (×4): 5 mg via ORAL
  Filled 2019-10-28 (×4): qty 1

## 2019-10-28 MED ORDER — ALBUTEROL SULFATE (2.5 MG/3ML) 0.083% IN NEBU: 2.5000 mg | INHALATION_SOLUTION | Freq: Four times a day (QID) | RESPIRATORY_TRACT | Status: DC | PRN

## 2019-10-28 MED ORDER — PANTOPRAZOLE SODIUM 40 MG PO TBEC
40.0000 mg | DELAYED_RELEASE_TABLET | Freq: Two times a day (BID) | ORAL | Status: DC
  Administered 2019-10-28 – 2019-10-31 (×7): 40 mg via ORAL
  Filled 2019-10-28 (×7): qty 1

## 2019-10-28 MED ORDER — POTASSIUM CHLORIDE CRYS ER 20 MEQ PO TBCR
40.0000 meq | EXTENDED_RELEASE_TABLET | Freq: Every day | ORAL | Status: DC
  Administered 2019-10-28 – 2019-10-31 (×4): 40 meq via ORAL
  Filled 2019-10-28 (×4): qty 2

## 2019-10-28 MED ORDER — MAGNESIUM SULFATE 4 GM/100ML IV SOLN
4.0000 g | INTRAVENOUS | Status: AC
  Administered 2019-10-28: 4 g via INTRAVENOUS
  Filled 2019-10-28: qty 100

## 2019-10-28 MED ORDER — TRAVASOL 10 % IV SOLN
INTRAVENOUS | Status: AC
  Administered 2019-10-28: 17:00:00 via INTRAVENOUS
  Filled 2019-10-28: qty 399.6

## 2019-10-28 MED ORDER — CARBAMIDE PEROXIDE 6.5 % OT SOLN
5.0000 [drp] | Freq: Two times a day (BID) | OTIC | Status: DC
  Administered 2019-10-29 – 2019-10-31 (×5): 5 [drp] via OTIC
  Filled 2019-10-28: qty 15

## 2019-10-28 MED ORDER — POTASSIUM CHLORIDE CRYS ER 20 MEQ PO TBCR
30.0000 meq | EXTENDED_RELEASE_TABLET | ORAL | Status: AC
  Administered 2019-10-28: 30 meq via ORAL
  Filled 2019-10-28: qty 2

## 2019-10-28 MED ORDER — METOPROLOL TARTRATE 100 MG PO TABS
100.0000 mg | ORAL_TABLET | Freq: Two times a day (BID) | ORAL | Status: DC
  Administered 2019-10-28 – 2019-10-31 (×6): 100 mg via ORAL
  Filled 2019-10-28 (×3): qty 1
  Filled 2019-10-28: qty 4
  Filled 2019-10-28 (×4): qty 1

## 2019-10-28 MED ORDER — CHLORHEXIDINE GLUCONATE CLOTH 2 % EX PADS
6.0000 | MEDICATED_PAD | Freq: Every day | CUTANEOUS | Status: DC
  Administered 2019-10-29 – 2019-10-31 (×3): 6 via TOPICAL

## 2019-10-28 MED ORDER — INSULIN ASPART 100 UNIT/ML ~~LOC~~ SOLN
0.0000 [IU] | Freq: Four times a day (QID) | SUBCUTANEOUS | Status: DC
  Administered 2019-10-29 – 2019-10-30 (×4): 1 [IU] via SUBCUTANEOUS

## 2019-10-28 MED ORDER — SODIUM CHLORIDE 0.9% FLUSH
3.0000 mL | Freq: Two times a day (BID) | INTRAVENOUS | Status: DC
  Administered 2019-10-30 – 2019-10-31 (×2): 3 mL via INTRAVENOUS

## 2019-10-28 MED ORDER — ACETAMINOPHEN 650 MG RE SUPP: 650.0000 mg | Freq: Four times a day (QID) | RECTAL | Status: DC | PRN

## 2019-10-28 MED ORDER — HYDROMORPHONE HCL 2 MG PO TABS
4.0000 mg | ORAL_TABLET | Freq: Four times a day (QID) | ORAL | Status: DC | PRN
  Administered 2019-10-28 – 2019-10-31 (×4): 4 mg via ORAL
  Filled 2019-10-28 (×5): qty 2

## 2019-10-28 MED ORDER — METHADONE HCL 10 MG PO TABS
20.0000 mg | ORAL_TABLET | Freq: Two times a day (BID) | ORAL | Status: DC
  Administered 2019-10-28 – 2019-10-31 (×7): 20 mg via ORAL
  Filled 2019-10-28 (×7): qty 2

## 2019-10-28 MED ORDER — PROMETHAZINE HCL 25 MG PO TABS
25.0000 mg | ORAL_TABLET | Freq: Four times a day (QID) | ORAL | Status: DC | PRN
  Administered 2019-10-28 – 2019-10-29 (×2): 25 mg via ORAL
  Filled 2019-10-28 (×2): qty 1

## 2019-10-28 MED ORDER — HEPARIN SODIUM (PORCINE) 5000 UNIT/ML IJ SOLN
5000.0000 [IU] | Freq: Three times a day (TID) | INTRAMUSCULAR | Status: DC
  Administered 2019-10-28 – 2019-10-31 (×10): 5000 [IU] via SUBCUTANEOUS
  Filled 2019-10-28 (×10): qty 1

## 2019-10-28 MED ORDER — PIPERACILLIN-TAZOBACTAM 3.375 G IVPB
3.3750 g | Freq: Three times a day (TID) | INTRAVENOUS | Status: DC
  Administered 2019-10-29 – 2019-10-30 (×5): 3.375 g via INTRAVENOUS
  Filled 2019-10-28 (×4): qty 50

## 2019-10-28 MED ORDER — ONDANSETRON HCL 4 MG/2ML IJ SOLN
4.0000 mg | Freq: Once | INTRAMUSCULAR | Status: AC
  Administered 2019-10-28: 4 mg via INTRAVENOUS
  Filled 2019-10-28: qty 2

## 2019-10-28 MED ORDER — SODIUM CHLORIDE 0.9% FLUSH
10.0000 mL | INTRAVENOUS | Status: DC | PRN
  Administered 2019-10-31: 10 mL
  Filled 2019-10-28: qty 40

## 2019-10-28 MED ORDER — ENOXAPARIN SODIUM 40 MG/0.4ML ~~LOC~~ SOLN: 40.0000 mg | SUBCUTANEOUS | Status: DC

## 2019-10-28 MED ORDER — DEXTROSE 5 % IV BOLUS
1000.0000 mL | Freq: Every day | INTRAVENOUS | Status: DC
  Administered 2019-10-28 – 2019-10-29 (×3): 1000 mL via INTRAVENOUS

## 2019-10-28 MED ORDER — HYDROMORPHONE HCL 1 MG/ML IJ SOLN
0.5000 mg | INTRAMUSCULAR | Status: DC | PRN
  Administered 2019-10-28 – 2019-10-30 (×6): 0.5 mg via INTRAVENOUS
  Filled 2019-10-28 (×7): qty 1

## 2019-10-28 MED ORDER — ACETAMINOPHEN 325 MG PO TABS
650.0000 mg | ORAL_TABLET | Freq: Four times a day (QID) | ORAL | Status: DC | PRN
  Administered 2019-10-28 – 2019-10-31 (×3): 650 mg via ORAL
  Filled 2019-10-28 (×3): qty 2

## 2019-10-28 MED ORDER — SODIUM PHOSPHATES 45 MMOLE/15ML IV SOLN
20.0000 mmol | Freq: Once | INTRAVENOUS | Status: AC
  Administered 2019-10-29: 20 mmol via INTRAVENOUS
  Filled 2019-10-28: qty 6.67

## 2019-10-28 MED ORDER — CYCLOSPORINE 0.05 % OP EMUL
1.0000 [drp] | Freq: Two times a day (BID) | OPHTHALMIC | Status: DC
  Administered 2019-10-28 – 2019-10-31 (×6): 1 [drp] via OPHTHALMIC
  Filled 2019-10-28 (×8): qty 30

## 2019-10-28 MED ORDER — DICYCLOMINE HCL 10 MG PO CAPS
10.0000 mg | ORAL_CAPSULE | Freq: Three times a day (TID) | ORAL | Status: DC
  Administered 2019-10-28 (×2): 10 mg via ORAL
  Filled 2019-10-28 (×2): qty 1

## 2019-10-28 MED ORDER — VANCOMYCIN HCL 10 G IV SOLR
1250.0000 mg | INTRAVENOUS | Status: DC
  Administered 2019-10-29: 1250 mg via INTRAVENOUS
  Filled 2019-10-28: qty 1250

## 2019-10-28 MED ORDER — HYDROMORPHONE HCL 1 MG/ML IJ SOLN
1.0000 mg | Freq: Once | INTRAMUSCULAR | Status: AC
  Administered 2019-10-28: 1 mg via INTRAVENOUS
  Filled 2019-10-28: qty 1

## 2019-10-28 NOTE — ED Provider Notes (Signed)
[facility] EMERGENCY DEPARTMENT Provider Note   CSN: [id number] Arrival date & time: 10/27/19  1616     History   Chief Complaint Chief Complaint  Patient presents with  . Leg Swelling  . Facial Swelling    HPI Caitlin Peters is a 58 y.o. female.     Patient presents to the emergency department for evaluation of generalized swelling.  Patient reports that this has been an ongoing issue for her but it is the worst it has ever been.  Patient has a history of severe Crohn's disease, status post 7 different surgeries.  She was previously on TPN but stopped it herself 3 or 4 weeks ago.  She now self administers IV hydration 1 L every day.  She has noticed progressive increase of the swelling of her legs with diffuse pain.  Today she has noticed swelling of her upper extremities and swelling around her face.  She has not had any chest pain or shortness of breath.  No fever.     Past Medical History:  Diagnosis Date  . Chronic pain syndrome   . Crohn disease ([place])   . Depression   . GERD (gastroesophageal reflux disease)   . HTN (hypertension)   . Malnutrition ([place])   . Osteoporosis   . Short gut syndrome   . Vitamin B12 deficiency     Patient Active Problem List   Diagnosis Date Noted  . Anasarca 10/28/2019  . Crohn disease ([place])   . Malnutrition ([place])   . Short gut syndrome   . HTN (hypertension)   . Vitamin B12 deficiency   . Osteoporosis   . Depression   . GERD (gastroesophageal reflux disease)   . Chronic pain syndrome   . Hypokalemia due to excessive gastrointestinal loss of potassium 10/13/2019  . Diarrhea   . Fever   . Hypokalemia 10/12/2019  . Chronic diarrhea 10/12/2019  . Dysuria 10/12/2019  . Bilateral lower extremity edema 10/12/2019  . Crohn's disease ([place]) 10/12/2019  . Anemia 10/12/2019  . Hypertension 10/12/2019  . AKI (acute kidney injury) ([place]) 10/12/2019    Past Surgical History:  Procedure Laterality Date  .  ABDOMINAL SURGERY       OB History   No obstetric history on file.      Home Medications    Prior to Admission medications   Medication Sig Start Date End Date Taking? Authorizing Provider  acetaminophen (TYLENOL) 325 MG tablet Take 650 mg by mouth every 6 (six) hours as needed for mild pain.    Yes [provider]  amLODipine (NORVASC) 5 MG tablet Take 1 tablet (5 mg total) by mouth daily. 10/16/19  Yes [name], [name], MD  buPROPion [facility] SR) 100 MG 12 hr tablet Take 100 mg by mouth 2 (two) times daily.   Yes [provider]  cycloSPORINE (RESTASIS) 0.05 % ophthalmic emulsion Place 1 drop into both eyes 2 (two) times daily.   Yes [provider]  dexlansoprazole (DEXILANT) 60 MG capsule Take 1 capsule (60 mg total) by mouth daily. 10/16/19  Yes [name], [name], MD  dicyclomine (BENTYL) 10 MG capsule Take 10 mg by mouth 3 (three) times daily before meals.   Yes [provider]  estradiol (ESTRACE) 2 MG tablet Take 2 mg by mouth daily.   Yes [provider]  HYDROmorphone (DILAUDID) 4 MG tablet Take 4 mg by mouth every 6 (six) hours as needed for moderate pain or severe pain.  Yes [provider]  loperamide (IMODIUM) 2 MG capsule Take 1 capsule (2 mg total) by mouth as needed for diarrhea or loose stools. 10/16/19  Yes [name], [name], MD  methadone (DOLOPHINE) 10 MG tablet Take 20 mg by mouth every 12 (twelve) hours.   Yes [provider]  metoprolol tartrate (LOPRESSOR) 100 MG tablet Take 100 mg by mouth 2 (two) times daily.   Yes [provider]  potassium chloride SA (KLOR-CON) 20 MEQ tablet Take 2 tablets (40 mEq total) by mouth daily. 10/16/19  Yes [name], [name], MD  promethazine (PHENERGAN) 25 MG tablet Take 1 tablet (25 mg total) by mouth every 6 (six) hours as needed for nausea or vomiting. 10/16/19  Yes Erline Hau, MD    Family History  History reviewed. No pertinent family history.  Social History Social History   Tobacco Use  . Smoking status: Former Research scientist (life sciences)  . Smokeless tobacco: Never Used  Substance Use Topics  . Alcohol use: Not Currently  . Drug use: Not on file     Allergies   Gabapentin, Lyrica [pregabalin], Topamax [topiramate], Fentanyl, and Morphine and related   Review of Systems Review of Systems  Constitutional: Negative for fever.  Respiratory: Negative for shortness of breath.   Cardiovascular: Positive for leg swelling.  All other systems reviewed and are negative.    Physical Exam Updated Vital Signs BP (!) 169/79   Pulse 61   Temp 98.4 F (36.9 C) (Oral)   Resp 11   SpO2 100%   Physical Exam Vitals signs and nursing note reviewed.  Constitutional:      General: She is not in acute distress.    Appearance: Normal appearance. She is well-developed.  HENT:     Head: Normocephalic and atraumatic.     Right Ear: Hearing normal.     Left Ear: Hearing normal.     Nose: Nose normal.  Eyes:     Conjunctiva/sclera: Conjunctivae normal.     Pupils: Pupils are equal, round, and reactive to light.  Neck:     Musculoskeletal: Normal range of motion and neck supple.  Cardiovascular:     Rate and Rhythm: Regular rhythm.     Heart sounds: S1 normal and S2 normal. No murmur. No friction rub. No gallop.   Pulmonary:     Effort: Pulmonary effort is normal. No respiratory distress.     Breath sounds: Normal breath sounds.  Chest:     Chest wall: No tenderness.  Abdominal:     General: Bowel sounds are normal.     Palpations: Abdomen is soft.     Tenderness: There is no abdominal tenderness. There is no guarding or rebound. Negative signs include Murphy's sign and McBurney's sign.     Hernia: No hernia is present.  Musculoskeletal: Normal range of motion.     Right lower leg: Edema present.     Left lower leg: Edema present.  Skin:    General: Skin is warm and dry.     Findings: Rash  (purpura left knee) present.  Neurological:     Mental Status: She is alert and oriented to person, place, and time.     GCS: GCS eye subscore is 4. GCS verbal subscore is 5. GCS motor subscore is 6.     Cranial Nerves: No cranial nerve deficit.     Sensory: No sensory deficit.     Coordination: Coordination normal.  Psychiatric:  Speech: Speech normal.        Behavior: Behavior normal.        Thought Content: Thought content normal.      ED Treatments / Results  Labs (all labs ordered are listed, but only abnormal results are displayed) Labs Reviewed  COMPREHENSIVE METABOLIC PANEL - Abnormal; Notable for the following components:      Result Value   Potassium 3.1 (*)    Chloride 113 (*)    CO2 16 (*)    Glucose, Bld 109 (*)    BUN 31 (*)    Creatinine, Ser 2.10 (*)    Calcium 8.3 (*)    Total Protein 6.1 (*)    Albumin 2.8 (*)    ALT 104 (*)    Alkaline Phosphatase 180 (*)    GFR calc non Af Amer 25 (*)    GFR calc Af Amer 29 (*)    All other components within normal limits  CBC WITH DIFFERENTIAL/PLATELET - Abnormal; Notable for the following components:   RBC 2.61 (*)    Hemoglobin 7.9 (*)    HCT 24.9 (*)    All other components within normal limits  BRAIN NATRIURETIC PEPTIDE - Abnormal; Notable for the following components:   B Natriuretic Peptide 162.4 (*)    All other components within normal limits  URINALYSIS, ROUTINE W REFLEX MICROSCOPIC - Abnormal; Notable for the following components:   APPearance HAZY (*)    Protein, ur 30 (*)    Leukocytes,Ua TRACE (*)    Bacteria, UA RARE (*)    All other components within normal limits  SARS CORONAVIRUS 2 (TAT 6-24 HRS)    EKG EKG Interpretation  Date/Time:  Wednesday October 28 2019 05:27:56 EST Ventricular Rate:  60 PR Interval:    QRS Duration: 86 QT Interval:  429 QTC Calculation: 429 R Axis:   57 Text Interpretation: Sinus rhythm Consider left ventricular hypertrophy No significant change since  last tracing Confirmed by Orpah Greek ([id number]) on 10/28/2019 5:33:19 AM   Radiology Dg Chest Port 1 View  Result Date: 10/28/2019 CLINICAL DATA:  Edema. New onset facial swelling. Patient complains of leg swelling for the last week. EXAM: PORTABLE CHEST 1 VIEW COMPARISON:  Two-view chest x-ray 10/12/2019 FINDINGS: The heart size and mediastinal contours are within normal limits. Both lungs are clear. The visualized skeletal structures are unremarkable. Right perihilar clips are again noted. IMPRESSION: Negative one-view chest x-ray Electronically Signed   By: San Morelle M.D.   On: 10/28/2019 05:00    Procedures Procedures (including critical care time)  Medications Ordered in ED Medications  sodium chloride flush (NS) 0.9 % injection 3 mL (3 mLs Intravenous Not Given 10/28/19 0409)  HYDROmorphone (DILAUDID) injection 1 mg (1 mg Intravenous Given 10/28/19 0526)  ondansetron (ZOFRAN) injection 4 mg (4 mg Intravenous Given 10/28/19 0526)     Initial Impression / Assessment and Plan / ED Course  I have reviewed the triage vital signs and the nursing notes.  Pertinent labs & imaging results that were available during my care of the patient were reviewed by me and considered in my medical decision making (see chart for details).        Patient presents to the emergency department for evaluation of generalized swelling.  Patient has evidence of anasarca on evaluation.  She does have a history of chronic swelling but significantly worsened over the last few days.  Patient previously living in the [place] area and treated there including at [facility]  clinic, recently moved here to be closer to her family.  Etiology of her anasarca is likely multifactorial.  Patient has had multiple bowel resections and was previously on TPN but stopped it herself.  She is therefore clearly malnourished with low albumin and proteins.  Additionally she has proteinuria.  No evidence of congestive heart  failure.  Will require hospitalization for further management.  Final Clinical Impressions(s) / ED Diagnoses   Final diagnoses:  Anasarca  Proteinuria, unspecified type  AKI (acute kidney injury) [facility])    ED Discharge Orders    None       Betsey Holiday Gwenyth Allegra, MD 10/28/19 0630

## 2019-10-28 NOTE — ED Notes (Signed)
PAGED ADMITTING PER RN  

## 2019-10-28 NOTE — H&P (Addendum)
History and Physical    Caitlin Peters KNL:[id number] DOB: 11-13-1961 DOA: 10/27/2019  Referring MD/NP/PA: Shela Leff, MD PCP: [name], [name], MD  Patient coming from: home  Chief Complaint: Swelling of face, arms, and legs  I have personally briefly reviewed patient's old medical records in [place]   HPI: Caitlin Peters is a 58 y.o. female with medical history significant of hypertension, Crohn's disease s/p multiple with residual short gut syndrome, chronic anemia requiring blood transfusions, chronic pain syndrome, depression, and osteoporosis.  Patient presents with complaints of progressively worsening swelling of her face, arms, and legs.  She moved from [place], [place] at the beginning of November and has been staying with her daughter.  Ultimately she had to move here because she was unable to care for herself at home.  She reports that she had 7 previous bowel resections, and had been on TPN since 2003.  However, patient reportedly self stopped TPN in October prior to her move here.  She states that she was tired of the multiple line infections as the reason for stopping.  Since stopping the TPN she reports that she has been falling more frequently, had generalized weakness, and lost approximately 30 pounds.  Reports having intermittent fevers that is more so chronic and diarrhea improved where she currently is only having 4 bowel movements per day on average.  She is in the process of establishing care with a PCP, but still needs a pain management specialist.  She reports having chronic abdominal pain from her previous surgeries.  Denies having any blood in stools, vomiting, chest pain, shortness of breath, or cough symptoms.  She was last admitted in the hospital in November at [facility] for diarrhea, but at that time TPN had not been restarted.   ED Course: Upon admission into the emergency department patient was seen to be afebrile with blood pressures 152/74  -180/78, and all other vital signs maintained.  Labs significant for hemoglobin of 7.9, potassium 3.1, BUN 31, creatinine 2.1, alkaline phosphatase 180, albumin 2.8, AST 28, ALT 104, and BNP 162.4.  Chest x-ray was otherwise clear.  Urinalysis did not show any signs of infection.  Patient having given 1 g of Dilaudid IV and Zofran.  TRH called to admit.  Review of Systems  Constitutional: Positive for fever and malaise/fatigue.  HENT: Negative for congestion and nosebleeds.   Eyes: Negative for pain and redness.  Respiratory: Negative for cough and shortness of breath.   Cardiovascular: Positive for leg swelling. Negative for chest pain.  Gastrointestinal: Positive for abdominal pain and diarrhea. Negative for nausea and vomiting.  Genitourinary: Negative for dysuria and hematuria.  Musculoskeletal: Positive for falls and myalgias.  Skin: Positive for rash.  Neurological: Positive for weakness. Negative for focal weakness and loss of consciousness.  Psychiatric/Behavioral: Negative for hallucinations and memory loss.    Past Medical History:  Diagnosis Date  . Chronic pain syndrome   . Crohn disease ([place])   . Depression   . GERD (gastroesophageal reflux disease)   . HTN (hypertension)   . Malnutrition ([place])   . Osteoporosis   . Short gut syndrome   . Vitamin B12 deficiency     Past Surgical History:  Procedure Laterality Date  . ABDOMINAL SURGERY       reports that she has quit smoking. She has never used smokeless tobacco. She reports previous alcohol use. No history on file for drug.  Allergies  Allergen Reactions  . Gabapentin Other (  See Comments)    unknown  . Lyrica [Pregabalin] Other (See Comments)    unknown  . Topamax [Topiramate] Other (See Comments)    unknown  . Fentanyl Rash  . Morphine And Related Rash    History reviewed. No pertinent family history.  Prior to Admission medications   Medication Sig Start Date End Date Taking? Authorizing Provider   acetaminophen (TYLENOL) 325 MG tablet Take 650 mg by mouth every 6 (six) hours as needed for mild pain.    Yes [provider]  amLODipine (NORVASC) 5 MG tablet Take 1 tablet (5 mg total) by mouth daily. 10/16/19  Yes [name], [name], MD  buPROPion [facility] SR) 100 MG 12 hr tablet Take 100 mg by mouth 2 (two) times daily.   Yes [provider]  cycloSPORINE (RESTASIS) 0.05 % ophthalmic emulsion Place 1 drop into both eyes 2 (two) times daily.   Yes [provider]  dexlansoprazole (DEXILANT) 60 MG capsule Take 1 capsule (60 mg total) by mouth daily. 10/16/19  Yes [name], [name], MD  dicyclomine (BENTYL) 10 MG capsule Take 10 mg by mouth 3 (three) times daily before meals.   Yes [provider]  estradiol (ESTRACE) 2 MG tablet Take 2 mg by mouth daily.   Yes [provider]  HYDROmorphone (DILAUDID) 4 MG tablet Take 4 mg by mouth every 6 (six) hours as needed for moderate pain or severe pain.   Yes [provider]  loperamide (IMODIUM) 2 MG capsule Take 1 capsule (2 mg total) by mouth as needed for diarrhea or loose stools. 10/16/19  Yes [name], [name], MD  methadone (DOLOPHINE) 10 MG tablet Take 20 mg by mouth every 12 (twelve) hours.   Yes [provider]  metoprolol tartrate (LOPRESSOR) 100 MG tablet Take 100 mg by mouth 2 (two) times daily.   Yes [provider]  potassium chloride SA (KLOR-CON) 20 MEQ tablet Take 2 tablets (40 mEq total) by mouth daily. 10/16/19  Yes [name], [name], MD  promethazine (PHENERGAN) 25 MG tablet Take 1 tablet (25 mg total) by mouth every 6 (six) hours as needed for nausea or vomiting. 10/16/19  Yes [name], [name], MD    Physical Exam:  Constitutional: Thin elderly female who appears to be in no acute distress at this time Vitals:   10/28/19 0445 10/28/19 0515 10/28/19 0530 10/28/19 0545  BP: (!) 169/79 (!) 175/77 (!) 173/79 (!)  180/78  Pulse:  62 (!) 59 (!) 58  Resp: 11 15 16 13   Temp:      TempSrc:      SpO2:  100% 98% 100%   Eyes: PERRL, lids and conjunctivae normal ENMT: Mucous membranes are moist. Posterior pharynx clear of any exudate or lesions.   Neck: normal, supple, no masses, no thyromegaly Respiratory: clear to auscultation bilaterally, no wheezing, no crackles. Normal respiratory effort. No accessory muscle use.  Cardiovascular: Regular rate and rhythm, no murmurs / rubs / gallops.  Mild swelling of the extremities appreciated. 2+ pedal pulses. No carotid bruits.  Left thigh catheter appreciated. Abdomen: Healed previous abdominal surgery scar.  Bowel sounds present.  Significant distention appreciated. Musculoskeletal: no clubbing / cyanosis. No joint deformity upper and lower extremities. Good ROM, no contractures. Normal muscle tone.  Skin: Nonspecific rash noted of the left knee. Neurologic: CN 2-12 grossly intact. Sensation intact, DTR normal. Strength 5/5 in all 4.  Psychiatric: Normal judgment and insight. Alert and oriented x  3. Normal mood.     Labs on Admission: I have personally reviewed following labs and imaging studies  CBC: Recent Labs  Lab 10/28/19 0427  WBC 4.1  NEUTROABS 2.2  HGB 7.9*  HCT 24.9*  MCV 95.4  PLT 831   Basic Metabolic Panel: Recent Labs  Lab 10/28/19 0427  NA 141  K 3.1*  CL 113*  CO2 16*  GLUCOSE 109*  BUN 31*  CREATININE 2.10*  CALCIUM 8.3*   GFR: CrCl cannot be calculated (Unknown ideal weight.). Liver Function Tests: Recent Labs  Lab 10/28/19 0427  AST 28  ALT 104*  ALKPHOS 180*  BILITOT 0.4  PROT 6.1*  ALBUMIN 2.8*   No results for input(s): LIPASE, AMYLASE in the last 168 hours. No results for input(s): AMMONIA in the last 168 hours. Coagulation Profile: No results for input(s): INR, PROTIME in the last 168 hours. Cardiac Enzymes: No results for input(s): CKTOTAL, CKMB, CKMBINDEX, TROPONINI in the last 168 hours. BNP (last 3  results) No results for input(s): PROBNP in the last 8760 hours. HbA1C: No results for input(s): HGBA1C in the last 72 hours. CBG: No results for input(s): GLUCAP in the last 168 hours. Lipid Profile: No results for input(s): CHOL, HDL, LDLCALC, TRIG, CHOLHDL, LDLDIRECT in the last 72 hours. Thyroid Function Tests: No results for input(s): TSH, T4TOTAL, FREET4, T3FREE, THYROIDAB in the last 72 hours. Anemia Panel: No results for input(s): VITAMINB12, FOLATE, FERRITIN, TIBC, IRON, RETICCTPCT in the last 72 hours. Urine analysis:    Component Value Date/Time   COLORURINE YELLOW 10/28/2019 0524   APPEARANCEUR HAZY (A) 10/28/2019 0524   LABSPEC 1.017 10/28/2019 0524   PHURINE 5.0 10/28/2019 0524   GLUCOSEU NEGATIVE 10/28/2019 0524   HGBUR NEGATIVE 10/28/2019 0524   BILIRUBINUR NEGATIVE 10/28/2019 0524   KETONESUR NEGATIVE 10/28/2019 0524   PROTEINUR 30 (A) 10/28/2019 0524   NITRITE NEGATIVE 10/28/2019 0524   LEUKOCYTESUR TRACE (A) 10/28/2019 0524   Sepsis Labs: No results found for this or any previous visit (from the past 240 hour(s)).   Radiological Exams on Admission: Dg Chest Port 1 View  Result Date: 10/28/2019 CLINICAL DATA:  Edema. New onset facial swelling. Patient complains of leg swelling for the last week. EXAM: PORTABLE CHEST 1 VIEW COMPARISON:  Two-view chest x-ray 10/12/2019 FINDINGS: The heart size and mediastinal contours are within normal limits. Both lungs are clear. The visualized skeletal structures are unremarkable. Right perihilar clips are again noted. IMPRESSION: Negative one-view chest x-ray Electronically Signed   By: San Morelle M.D.   On: 10/28/2019 05:00    EKG: Independently reviewed.  Sinus rhythm at 60 bpm  Assessment/Plan Anasarca suspected secondary to protein calorie malnutrition: Acute.  Patient presents with swelling of the face and lower extremities.  Labs reveal significant hypoalbuminemia.  TSH was noted to be at lower limit of  normal at 0.54 on 11/20.  At this time suspect patient is likely third spacing due to poor nutritional status. -Admit to a medical telemetry bed -Check prealbumin -Pharmacy consult for TPN  -Consult dietitian  Crohn's disease, short gut syndrome, diarrhea: Chronic.  Patient with history of 7 previous bowel resections due to Crohn's disease resulting in short syndrome.  Currently only reports having 4 bowel movements per day. -Continue Imodium prn and Bentyl  Acute kidney injury superimposed on chronic kidney disease stage III: At baseline patient's creatinine had been around 1.5 previously.  Based off of the current clinical picture suspect patient's kidney function we will improve once  able to get more fluid intravascularly.  Patient previously had been on 1 L of D5W daily. -Check urine creatinine and urine sodium to calculate FeNa -1 L of D5W daily -Recheck creatinine in a.m.  Hypokalemia: Acute on chronic.  Patient presents with a potassium 3.1, but is on oral supplementation of 40 mEq of potassium chloride daily.  Suspect chronic potassium losses due to diarrhea. -Give additional 30 mEq p.o. x1 dose -Continue regimen   Essential hypertension: Uncontrolled.  On admission blood pressures elevated up to 180/78.  Home medications include amlodipine 5 mg daily and metoprolol 100 mg twice daily. -Continue home regimen -Consider need of an adjustment home medications if blood pressures remain uncontrolled   Falls: Patient reports having multiple falls at home.  Suspect secondary to weakness due to poor nutrition, but secondary causes include polypharmacy with pain medications. -PT/OT to eval and treat  Normocytic anemia: Chronic.  Hemoglobin 7.9 g/dL which appears around her baseline per review of records from last month.  Patient denies any reports of bleeding.  Normally we will be transfused if hemoglobin less than 6. -Continue to monitor  Depression: Patient on Wellbutrin 100 mg twice  daily. -Continue Wellbutrin   Chronic pain syndrome: Patient reports having chronic pain.  Since moving she has not been able to establish care with a pain management specialist.  On Dilaudid 4 mg every 6 hours as needed for pain and methadone 20 mg twice daily.  -Continue current pain regimen  GERD -Continue pharmacy substitution for Dexilant  DVT prophylaxis: Heparin Code Status: Full Family Communication: No family present at bedside Disposition Plan: Likely discharge home in 1 to 2 days Consults called: Pharmacy Admission status: Observation  Norval Morton MD [facility] Pager [phone]   If 7PM-7AM, please contact night-coverage [email or web address] Password [facility]  10/28/2019, 7:52 AM

## 2019-10-28 NOTE — ED Notes (Signed)
RN attempted to obtain blood from IV line due to extreme hard stick; IV line has blown and will need IV team consult for new IV line and blood draw-Monique,RN

## 2019-10-28 NOTE — ED Notes (Signed)
Lunch Tray Ordered @ 1310.

## 2019-10-28 NOTE — ED Notes (Signed)
ED TO INPATIENT HANDOFF REPORT  ED Nurse Name and Phone #: Gwyndolyn Saxon [phone]  S Name/Age/Gender [name] 58 y.o. female Room/Bed: 051C/051C  Code Status   Code Status: Full Code  Home/SNF/Other Home Patient oriented to: self, place, time and situation Is this baseline? Yes   Triage Complete: Triage complete  Chief Complaint leg and eye swelling  Triage Note Pt from home with ems c.o bilateral leg swelling for the past week and she woke up this morning with bilateral eye swelling. Hx of crohns. Denies abd pain n/v/d. Pt a.o   Allergies Allergies  Allergen Reactions  . Gabapentin Other (See Comments)    unknown  . Lyrica [Pregabalin] Other (See Comments)    unknown  . Topamax [Topiramate] Other (See Comments)    unknown  . Fentanyl Rash  . Morphine And Related Rash    Level of Care/Admitting Diagnosis ED Disposition    ED Disposition Condition [facility] Area: [place] [[id number]]  Level of Care: Telemetry Medical [104]  Covid Evaluation: Asymptomatic Screening Protocol (No Symptoms)  Diagnosis: Anasarca [102585]  Admitting Physician: Norval Morton [[id number]]  Attending Physician: Norval Morton [[id number]]  Estimated length of stay: past midnight tomorrow  Certification:: I certify this patient will need inpatient services for at least 2 midnights  PT Class (Do Not Modify): Inpatient [101]  PT Acc Code (Do Not Modify): Private [1]       B Medical/Surgery History Past Medical History:  Diagnosis Date  . Chronic pain syndrome   . Crohn disease ([place])   . Depression   . GERD (gastroesophageal reflux disease)   . HTN (hypertension)   . Malnutrition ([place])   . Osteoporosis   . Short gut syndrome   . Vitamin B12 deficiency    Past Surgical History:  Procedure Laterality Date  . ABDOMINAL SURGERY       A IV Location/Drains/Wounds Patient Lines/Drains/Airways Status   Active Line/Drains/Airways    Name:   Placement date:   Placement time:   Site:   Days:   Peripheral IV 10/28/19 Right;Anterior Forearm   10/28/19    1727    Forearm   less than 1   Peripheral IV 10/28/19 Right;Lateral Forearm   10/28/19    2219    Forearm   less than 1   PICC Single Lumen 10/12/19 PICC Left Femoral   10/12/19    -    Femoral   16          Intake/Output Last 24 hours  Intake/Output Summary (Last 24 hours) at 10/28/2019 2229 Last data filed at 10/28/2019 1604 Gross per 24 hour  Intake 99.93 ml  Output -  Net 99.93 ml    Labs/Imaging Results for orders placed or performed during the hospital encounter of 10/27/19 (from the past 48 hour(s))  Comprehensive metabolic panel     Status: Abnormal   Collection Time: 10/28/19  4:27 AM  Result Value Ref Range   Sodium 141 135 - 145 mmol/L   Potassium 3.1 (L) 3.5 - 5.1 mmol/L   Chloride 113 (H) 98 - 111 mmol/L   CO2 16 (L) 22 - 32 mmol/L   Glucose, Bld 109 (H) 70 - 99 mg/dL   BUN 31 (H) 6 - 20 mg/dL   Creatinine, Ser 2.10 (H) 0.44 - 1.00 mg/dL   Calcium 8.3 (L) 8.9 - 10.3 mg/dL   Total Protein 6.1 (L) 6.5 - 8.1 g/dL   Albumin 2.8 (  L) 3.5 - 5.0 g/dL   AST 28 15 - 41 U/L   ALT 104 (H) 0 - 44 U/L   Alkaline Phosphatase 180 (H) 38 - 126 U/L   Total Bilirubin 0.4 0.3 - 1.2 mg/dL   GFR calc non Af Amer 25 (L) >60 mL/min   GFR calc Af Amer 29 (L) >60 mL/min   Anion gap 12 5 - 15    Comment: Performed at [facility] [address] [place], [place] [address]  CBC with Differential     Status: Abnormal   Collection Time: 10/28/19  4:27 AM  Result Value Ref Range   WBC 4.1 4.0 - 10.5 K/uL   RBC 2.61 (L) 3.87 - 5.11 MIL/uL   Hemoglobin 7.9 (L) 12.0 - 15.0 g/dL   HCT 24.9 (L) 36.0 - 46.0 %   MCV 95.4 80.0 - 100.0 fL   MCH 30.3 26.0 - 34.0 pg   MCHC 31.7 30.0 - 36.0 g/dL   RDW 14.7 11.5 - 15.5 %   Platelets 232 150 - 400 K/uL   nRBC 0.0 0.0 - 0.2 %   Neutrophils Relative % 54 %   Neutro Abs 2.2 1.7 - 7.7 K/uL   Lymphocytes Relative 33 %    Lymphs Abs 1.4 0.7 - 4.0 K/uL   Monocytes Relative 12 %   Monocytes Absolute 0.5 0.1 - 1.0 K/uL   Eosinophils Relative 1 %   Eosinophils Absolute 0.0 0.0 - 0.5 K/uL   Basophils Relative 0 %   Basophils Absolute 0.0 0.0 - 0.1 K/uL   Immature Granulocytes 0 %   Abs Immature Granulocytes 0.01 0.00 - 0.07 K/uL    Comment: Performed at [facility] [address] [place], [place] [address]  Brain natriuretic peptide     Status: Abnormal   Collection Time: 10/28/19  4:35 AM  Result Value Ref Range   B Natriuretic Peptide 162.4 (H) 0.0 - 100.0 pg/mL    Comment: Performed at [facility] [address], [place], [place] [address]  SARS CORONAVIRUS 2 (TAT 6-24 HRS) Nasopharyngeal Nasopharyngeal Swab     Status: None   Collection Time: 10/28/19  4:50 AM   Specimen: Nasopharyngeal Swab  Result Value Ref Range   SARS Coronavirus 2 NEGATIVE NEGATIVE    Comment: (NOTE) SARS-CoV-2 target nucleic acids are NOT DETECTED. The SARS-CoV-2 RNA is generally detectable in upper and lower respiratory specimens during the acute phase of infection. Negative results do not preclude SARS-CoV-2 infection, do not rule out co-infections with other pathogens, and should not be used as the sole basis for treatment or other patient management decisions. Negative results must be combined with clinical observations, patient history, and epidemiological information. The expected result is Negative. Fact Sheet for Patients: SugarRoll.be Fact Sheet for Healthcare Providers: https://www.woods-mathews.com/ This test is not yet approved or cleared by the [place] FDA and  has been authorized for detection and/or diagnosis of SARS-CoV-2 by FDA under an Emergency Use Authorization (EUA). This EUA will remain  in effect (meaning this test can be used) for the duration of the COVID-19 declaration under Section 56 4(b)(1) of the Act, 21 U.S.C. section  360bbb-3(b)(1), unless the authorization is terminated or revoked sooner. Performed at [facility], [place] [address], [place], [place] [address]   Urinalysis, Routine w reflex microscopic     Status: Abnormal   Collection Time: 10/28/19  5:24 AM  Result Value Ref Range   Color, Urine  YELLOW YELLOW   APPearance HAZY (A) CLEAR   Specific Gravity, Urine 1.017 1.005 - 1.030   pH 5.0 5.0 - 8.0   Glucose, UA NEGATIVE NEGATIVE mg/dL   Hgb urine dipstick NEGATIVE NEGATIVE   Bilirubin Urine NEGATIVE NEGATIVE   Ketones, ur NEGATIVE NEGATIVE mg/dL   Protein, ur 30 (A) NEGATIVE mg/dL   Nitrite NEGATIVE NEGATIVE   Leukocytes,Ua TRACE (A) NEGATIVE   RBC / HPF 0-5 0 - 5 RBC/hpf   WBC, UA 0-5 0 - 5 WBC/hpf   Bacteria, UA RARE (A) NONE SEEN   Squamous Epithelial / LPF 6-10 0 - 5   Mucus PRESENT     Comment: Performed at [facility], [place] [address], [place], [place] [address]  Creatinine, urine, random     Status: None   Collection Time: 10/28/19  5:24 AM  Result Value Ref Range   Creatinine, Urine 129.81 mg/dL    Comment: Performed at [facility] [address] [place], [place] [address]  Sodium, urine, random     Status: None   Collection Time: 10/28/19  5:24 AM  Result Value Ref Range   Sodium, Ur 41 mmol/L    Comment: Performed at [facility] [address] [place], [place] [address]  Magnesium     Status: Abnormal   Collection Time: 10/28/19  9:22 AM  Result Value Ref Range   Magnesium 0.8 (LL) 1.7 - 2.4 mg/dL    Comment: CRITICAL RESULT CALLED TO, READ BACK BY AND VERIFIED WITH: J.BLUE,RN 1058 10/28/2019 CLARK,S Performed at [facility], [place] [address] [place], [place] [address]   Phosphorus     Status: None   Collection Time: 10/28/19  9:22 AM  Result Value Ref Range   Phosphorus 3.9 2.5 - 4.6 mg/dL    Comment: Performed at [facility] [address], [place], [place] [address]  CBG monitoring, ED     Status: Abnormal   Collection  Time: 10/28/19  7:26 PM  Result Value Ref Range   Glucose-Capillary 114 (H) 70 - 99 mg/dL   Comment 1 Notify RN    Comment 2 Document in Chart    Dg Chest Port 1 View  Result Date: 10/28/2019 CLINICAL DATA:  Edema. New onset facial swelling. Patient complains of leg swelling for the last week. EXAM: PORTABLE CHEST 1 VIEW COMPARISON:  Two-view chest x-ray 10/12/2019 FINDINGS: The heart size and mediastinal contours are within normal limits. Both lungs are clear. The visualized skeletal structures are unremarkable. Right perihilar clips are again noted. IMPRESSION: Negative one-view chest x-ray Electronically Signed   By: San Morelle M.D.   On: 10/28/2019 05:00    Pending Labs Unresulted Labs (From admission, onward)    Start     Ordered   11/02/19 0500  CBC  (TPN Lab Panel)  Every Monday (0500),   R     10/28/19 0921   11/02/19 0500  Differential  (TPN Lab Panel)  Every Monday (0500),   R     10/28/19 0921   11/02/19 0500  Triglycerides  (TPN Lab Panel)  Every Monday (0500),   R     10/28/19 0921   11/02/19 0500  Prealbumin  (TPN Lab Panel)  Every Monday (0500),   R     10/28/19 0921   10/29/19 0500  Comprehensive metabolic panel  (TPN Lab Panel)  Tomorrow morning,   R     10/28/19 0921   10/29/19  0500  Prealbumin  (TPN Lab Panel)  Tomorrow morning,   R     10/28/19 0921   10/29/19 0500  Magnesium  (TPN Lab Panel)  Tomorrow morning,   R     10/28/19 0921   10/29/19 0500  Phosphorus  (TPN Lab Panel)  Tomorrow morning,   R     10/28/19 0921   10/29/19 0500  Triglycerides  (TPN Lab Panel)  Tomorrow morning,   R     10/28/19 0921   10/29/19 0500  CBC  (TPN Lab Panel)  Tomorrow morning,   R     10/28/19 0921   10/29/19 0500  Differential  (TPN Lab Panel)  Tomorrow morning,   R     10/28/19 0921   10/29/19 0500  Comprehensive metabolic panel  (TPN Lab Panel)  Every Mon,Thu (0500),   R     10/28/19 0921   10/28/19 1117  Hemoglobin A1c  Once,   STAT    Comments: To assess prior  glycemic control    10/28/19 1117   10/28/19 0937  Magnesium  Add-on,   AD     10/28/19 0937   10/28/19 0937  Phosphorus  Add-on,   AD     10/28/19 0937   10/28/19 0922  Magnesium  (TPN Lab Panel)  Every Mon,Thu (0500),   R     10/28/19 0921   10/28/19 0922  Phosphorus  (TPN Lab Panel)  Every Mon,Thu (0500),   R     10/28/19 0921          Vitals/Pain Today's Vitals   10/28/19 1406 10/28/19 1500 10/28/19 1515 10/28/19 2115  BP:  (!) 142/73    Pulse:      Resp:  11 20   Temp:      TempSrc:      SpO2:      Weight:      Height:      PainSc: 6    3     Isolation Precautions No active isolations  Medications Medications  sodium chloride flush (NS) 0.9 % injection 3 mL (3 mLs Intravenous Not Given 10/28/19 0409)  methadone (DOLOPHINE) tablet 20 mg (20 mg Oral Given 10/28/19 0906)  HYDROmorphone (DILAUDID) tablet 4 mg (4 mg Oral Given 10/28/19 0900)  amLODipine (NORVASC) tablet 5 mg (5 mg Oral Given 10/28/19 0906)  metoprolol tartrate (LOPRESSOR) tablet 100 mg (100 mg Oral Given 10/28/19 0906)  pantoprazole (PROTONIX) EC tablet 40 mg (40 mg Oral Given 10/28/19 0905)  cycloSPORINE (RESTASIS) 0.05 % ophthalmic emulsion 1 drop (1 drop Both Eyes Given 10/28/19 1444)  promethazine (PHENERGAN) tablet 25 mg (25 mg Oral Given 10/28/19 0900)  potassium chloride SA (KLOR-CON) CR tablet 40 mEq (40 mEq Oral Given 10/28/19 0905)  loperamide (IMODIUM) capsule 2 mg (has no administration in time range)  dicyclomine (BENTYL) capsule 10 mg (10 mg Oral Given 10/28/19 1730)  sodium chloride flush (NS) 0.9 % injection 3 mL (3 mLs Intravenous Not Given 10/28/19 0906)  acetaminophen (TYLENOL) tablet 650 mg (has no administration in time range)    Or  acetaminophen (TYLENOL) suppository 650 mg (has no administration in time range)  albuterol (PROVENTIL) (2.5 MG/3ML) 0.083% nebulizer solution 2.5 mg (has no administration in time range)  heparin injection 5,000 Units (5,000 Units Subcutaneous Given 10/28/19  1444)  carbamide peroxide (DEBROX) 6.5 % OTIC (EAR) solution 5 drop (5 drops Left EAR Not Given 10/28/19 1445)  HYDROmorphone (DILAUDID) injection 0.5 mg (has no administration in time  range)  dextrose 5 % bolus 1,000 mL (1,000 mLs Intravenous New Bag/Given 10/28/19 1403)  TPN ADULT (ION) ( Intravenous New Bag/Given 10/28/19 1712)  insulin aspart (novoLOG) injection 0-9 Units (0 Units Subcutaneous Not Given 10/28/19 1930)  sodium chloride flush (NS) 0.9 % injection 10-40 mL (has no administration in time range)  Chlorhexidine Gluconate Cloth 2 % PADS 6 each (has no administration in time range)  HYDROmorphone (DILAUDID) injection 1 mg (1 mg Intravenous Given 10/28/19 0526)  ondansetron (ZOFRAN) injection 4 mg (4 mg Intravenous Given 10/28/19 0526)  potassium chloride SA (KLOR-CON) CR tablet 30 mEq (30 mEq Oral Given 10/28/19 0859)  magnesium sulfate IVPB 4 g 100 mL (0 g Intravenous Stopped 10/28/19 1604)    Mobility walks with person assist High fall risk   Focused Assessments    R Recommendations: See Admitting Provider Note  Report given to:   Additional Notes: patient is A&Ox 4 and ambulates with stand by assist; 10 pm meds are still due at this time; IV team placed new IV site and Dextrose at 250 ml/hr restarted; TPN running in single lumen Femoral central line-Monique,RN

## 2019-10-28 NOTE — ED Notes (Signed)
Magnesium 0.8

## 2019-10-28 NOTE — ED Notes (Signed)
IV team at bedside 

## 2019-10-28 NOTE — ED Notes (Signed)
PT with patient for eval

## 2019-10-28 NOTE — ED Notes (Signed)
Per Pharmacy only 1 L of Dextrose per day; pt has received 1L of Dextrose in ED-Monique,RN

## 2019-10-28 NOTE — Progress Notes (Signed)
PHARMACY - TOTAL PARENTERAL NUTRITION CONSULT NOTE  Indication: Short gut syndrome  Patient Measurements: Height: 5' 8"  (172.7 cm) Weight: 130 lb (59 kg) IBW/kg (Calculated) : 63.9   Body mass index is 19.77 kg/m. Usual Weight: 150-155 lbs (68-70 kg)  Assessment:  58 YOF presented 10/27/19 with bilateral leg and eye swelling.  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  Patient took herself off of TPN to see if she could sustain without it.  She is tired of frequent line infections and hospitalizations.  MD suspects anasarca is secondary to [facility] and Pharmacy has been consulted to restart TPN.  Patient reports that she receives TPN only 2 times a week and on days that she does not get TPN she receives 1L of D5W [facility] pharmacist reports D51/2NS and TPN 3 times per week).  She also endorses a 30-lb weight loss in 30-45 days since she was off TPN.  Patient is at risk for refeeding syndrome.  Glucose / Insulin: no hx DM - AM glucose WNL, not on SSI Electrolytes: K 3.1 (12mq PO daily + 3107m x1), high CL and low CO2, Mag 0.8 Renal: AKI - SCr up 2.1, BUN 31 LFTs / TGs: alk phos/ALT elevated 180/104, others WNL Prealbumin / albumin: albumin 2.8 Intake / Output; MIVF:  GI Imaging: Surgeries / Procedures:   Central access: PICC placed 10/12/19 (recent line fxn and line replaced) TPN start date: 10/28/19  Nutritional Goals (RD rec pending): 1750-2000 kCal, 90-105gm protein per day  Current Nutrition:  Heart diet  [facility]:  AdFabio [phone] in Sept 2020:  TPN 3 times per week, 150037mver 12 hrs, no lipid = 428 kCal, 80gm AA.  D51/2NS 1L on days not on TPN. [place]PN  Plan:  Start TPN at 30 mL/hr at 1800 (goal rate ~75 ml/hr) TPN will provide 40g AA, 104g CHO and 22g ILE for a total of 738 kCal, meeting ~40% of patient needs Electrolytes in TPN: standard  except increased Mag, max acetate today Add standard MVI and trace elements to TPN Initiate sensitive SSI Q6H and adjust as needed Mag sulfate 4gm >> repeat labs this evening F/U AM labs, starting mIVF  Jaren Kearn D. DanMina MarbleharmD, BCPS, [place]CC[place]/12/2018, 11:06 AM

## 2019-10-28 NOTE — Evaluation (Signed)
Physical Therapy Evaluation Patient Details Name: Caitlin Peters MRN: [id number] DOB: [birth year] Today's Date: 10/28/2019   History of Present Illness  Patient is a 58 y/o female admitted with generalized swelling after going off her TPN 4-5 weeks ago and keeping up with IV fluids daily.  She has PMH of Crohn's disease with 7 previous surgeries.  Clinical Impression  Patient presents with decreased mobility admitting to significant decline since discontinuing TPN.  She currently needs S for most mobility with RW and has had several falls lately (some due to falling asleep per her report.)  She has family assist since moving to [facility] and is comfortable progressing mobility at home with their help.  Previously lived alone in [place] and walked her dog 4-5 times daily outside.  Feel she will benefit from skilled PT in the acute setting to maximize mobility and ensure safety for home entry up flight of steps to her daughter's apartment.  She declines follow up PT at this time.      Follow Up Recommendations No PT follow up    Equipment Recommendations  Rolling walker with 5" wheels    Recommendations for Other Services       Precautions / Restrictions Precautions Precautions: Fall Precaution Comments: reports 3-4 falls since moving here from [place], [place] last month, reports falls asleep      Mobility  Bed Mobility Overal bed mobility: Needs Assistance Bed Mobility: Supine to Sit     Supine to sit: Modified independent (Device/Increase time)        Transfers Overall transfer level: Needs assistance Equipment used: Rolling walker (2 wheeled) Transfers: Sit to/from Stand Sit to Stand: Supervision         General transfer comment: assist for safety with lines  Ambulation/Gait Ambulation/Gait assistance: Supervision Gait Distance (Feet): 200 Feet Assistive device: Rolling walker (2 wheeled) Gait Pattern/deviations: Step-through pattern;Step-to pattern     General Gait  Details: slow pace, but stable with RW, happy to get up and walk  Stairs            Wheelchair Mobility    Modified Rankin (Stroke Patients Only)       Balance Overall balance assessment: Needs assistance   Sitting balance-Leahy Scale: Good     Standing balance support: No upper extremity supported Standing balance-Leahy Scale: Fair Standing balance comment: initially standing without device with S for balance                             Pertinent Vitals/Pain Pain Assessment: Faces Faces Pain Scale: Hurts even more Pain Location: feet Pain Descriptors / Indicators: Burning Pain Intervention(s): Monitored during session;Repositioned;Limited activity within patient's tolerance    Home Living Family/patient expects to be discharged to:: Private residence Living Arrangements: Children Available Help at Discharge: Family;Available 24 hours/day Type of Home: Apartment Home Access: Stairs to enter Entrance Stairs-Rails: Right Entrance Stairs-Number of Steps: flight Home Layout: One level Home Equipment: Walker - 4 wheels;Cane - single point      Prior Function Level of Independence: Needs assistance   Gait / Transfers Assistance Needed: grandsons help her on stairs, she uses cane vs rollator independently for ambulation  ADL's / Homemaking Assistance Needed: reports family has watched her in the shower and been available since she moved here  Comments: evidently was very active and independent prior to going off TPN, walked her dog 4-5 times a day     Hand Dominance  Extremity/Trunk Assessment   Upper Extremity Assessment Upper Extremity Assessment: LUE deficits/detail;RUE deficits/detail RUE Deficits / Details: AROM WFL, strength grossly 3-4/5 throughout with some numbness in fingers, noted redness as well in hands, reports swelling is better LUE Deficits / Details: AROM WFL, strength grossly 3-4/5 throughout with some numbness in fingers,  noted redness as well in hands, reports swelling is better    Lower Extremity Assessment Lower Extremity Assessment: LLE deficits/detail;RLE deficits/detail RLE Deficits / Details: AROM WFL, strength hip flexion 3-/5, knee extension 4-/5, ankle DF 4+/5, tenderness on feet LLE Deficits / Details: AROM WFL, strength hip flexion 3-/5, knee extension 4-/5, ankle DF 4+/5, tenderness on feet       Communication   Communication: No difficulties  Cognition Arousal/Alertness: Awake/alert Behavior During Therapy: WFL for tasks assessed/performed Overall Cognitive Status: Within Functional Limits for tasks assessed                                        General Comments General comments (skin integrity, edema, etc.): toileted in bathroom with S    Exercises     Assessment/Plan    PT Assessment Patient needs continued PT services  PT Problem List Decreased strength;Decreased mobility;Decreased safety awareness;Decreased balance;Pain       PT Treatment Interventions DME instruction;Stair training;Therapeutic activities;Balance training;Patient/family education;Therapeutic exercise;Functional mobility training;Gait training    PT Goals (Current goals can be found in the Care Plan section)  Acute Rehab PT Goals Patient Stated Goal: to return to independent PT Goal Formulation: With patient Time For Goal Achievement: 11/11/19 Potential to Achieve Goals: Good    Frequency Min 3X/week   Barriers to discharge        Co-evaluation               AM-PAC PT "6 Clicks" Mobility  Outcome Measure Help needed turning from your back to your side while in a flat bed without using bedrails?: None Help needed moving from lying on your back to sitting on the side of a flat bed without using bedrails?: None Help needed moving to and from a bed to a chair (including a wheelchair)?: None Help needed standing up from a chair using your arms (e.g., wheelchair or bedside chair)?:  None Help needed to walk in hospital room?: None Help needed climbing 3-5 steps with a railing? : A Little 6 Click Score: 23    End of Session   Activity Tolerance: Patient tolerated treatment well Patient left: in bed;with call bell/phone within reach   PT Visit Diagnosis: Muscle weakness (generalized) (M62.81)    Time: 1779-3903 PT Time Calculation (min) (ACUTE ONLY): 34 min   Charges:   PT Evaluation $PT Eval Moderate Complexity: 1 Mod PT Treatments $Gait Training: 8-22 mins        Magda Kiel, [place] [phone] 10/28/2019   Reginia Naas 10/28/2019, 2:25 PM

## 2019-10-28 NOTE — Progress Notes (Signed)
Pharmacy Antibiotic Note  Caitlin Peters is a 58 y.o. female admitted on 10/27/2019 with anasarca.  Pharmacy has been consulted for Vancomycin/Zosyn dosing for fever of unknown origin. WBC WNL. Acute on chronic kidney disease. Short gut syndrome, had been on chronic TPN.   Plan: Vancomycin 1250 mg IV q48h >>Estimated AUC: 513 Zosyn 3.375G IV q8h to be infused over 4 hours Trend WBC, temp, renal function  Drug levels as indicated Will need to r/o bacteremia with PICC in place  Height: 5' 8"  (172.7 cm) Weight: 134 lb 6.4 oz (61 kg) IBW/kg (Calculated) : 63.9  Temp (24hrs), Avg:100.7 F (38.2 C), Min:98.4 F (36.9 C), Max:103 F (39.4 C)  Recent Labs  Lab 10/28/19 0427  WBC 4.1  CREATININE 2.10*    Estimated Creatinine Clearance: 28.1 mL/min (A) (by C-G formula based on SCr of 2.1 mg/dL (H)).    Allergies  Allergen Reactions  . Gabapentin Other (See Comments)    unknown  . Lyrica [Pregabalin] Other (See Comments)    unknown  . Topamax [Topiramate] Other (See Comments)    unknown  . Fentanyl Rash  . Morphine And Related Rash   Narda Bonds, PharmD, BCPS Clinical Pharmacist Phone: [phone]

## 2019-10-29 ENCOUNTER — Encounter (HOSPITAL_COMMUNITY): Payer: Self-pay | Admitting: General Practice

## 2019-10-29 LAB — DIFFERENTIAL
Abs Immature Granulocytes: 0.09 10*3/uL — ABNORMAL HIGH (ref 0.00–0.07)
Basophils Absolute: 0 10*3/uL (ref 0.0–0.1)
Basophils Relative: 0 %
Eosinophils Absolute: 0 10*3/uL (ref 0.0–0.5)
Eosinophils Relative: 0 %
Immature Granulocytes: 1 %
Lymphocytes Relative: 6 %
Lymphs Abs: 0.8 10*3/uL (ref 0.7–4.0)
Monocytes Absolute: 0.5 10*3/uL (ref 0.1–1.0)
Monocytes Relative: 4 %
Neutro Abs: 11.6 10*3/uL — ABNORMAL HIGH (ref 1.7–7.7)
Neutrophils Relative %: 89 %

## 2019-10-29 LAB — COMPREHENSIVE METABOLIC PANEL
ALT: 60 U/L — ABNORMAL HIGH (ref 0–44)
AST: 16 U/L (ref 15–41)
Albumin: 2.2 g/dL — ABNORMAL LOW (ref 3.5–5.0)
Alkaline Phosphatase: 148 U/L — ABNORMAL HIGH (ref 38–126)
Anion gap: 9 (ref 5–15)
BUN: 28 mg/dL — ABNORMAL HIGH (ref 6–20)
CO2: 17 mmol/L — ABNORMAL LOW (ref 22–32)
Calcium: 7.8 mg/dL — ABNORMAL LOW (ref 8.9–10.3)
Chloride: 113 mmol/L — ABNORMAL HIGH (ref 98–111)
Creatinine, Ser: 1.95 mg/dL — ABNORMAL HIGH (ref 0.44–1.00)
GFR calc Af Amer: 32 mL/min — ABNORMAL LOW (ref >60)
GFR calc non Af Amer: 28 mL/min — ABNORMAL LOW (ref >60)
Glucose, Bld: 115 mg/dL — ABNORMAL HIGH (ref 70–99)
Potassium: 3.1 mmol/L — ABNORMAL LOW (ref 3.5–5.1)
Sodium: 139 mmol/L (ref 135–145)
Total Bilirubin: 0.6 mg/dL (ref 0.3–1.2)
Total Protein: 5.2 g/dL — ABNORMAL LOW (ref 6.5–8.1)

## 2019-10-29 LAB — CBC
HCT: 22 % — ABNORMAL LOW (ref 36.0–46.0)
Hemoglobin: 7.3 g/dL — ABNORMAL LOW (ref 12.0–15.0)
MCH: 30.8 pg (ref 26.0–34.0)
MCHC: 33.2 g/dL (ref 30.0–36.0)
MCV: 92.8 fL (ref 80.0–100.0)
Platelets: 136 10*3/uL — ABNORMAL LOW (ref 150–400)
RBC: 2.37 MIL/uL — ABNORMAL LOW (ref 3.87–5.11)
RDW: 14.6 % (ref 11.5–15.5)
WBC: 13 10*3/uL — ABNORMAL HIGH (ref 4.0–10.5)
nRBC: 0 % (ref 0.0–0.2)

## 2019-10-29 LAB — GLUCOSE, CAPILLARY
Glucose-Capillary: 112 mg/dL — ABNORMAL HIGH (ref 70–99)
Glucose-Capillary: 115 mg/dL — ABNORMAL HIGH (ref 70–99)
Glucose-Capillary: 121 mg/dL — ABNORMAL HIGH (ref 70–99)
Glucose-Capillary: 125 mg/dL — ABNORMAL HIGH (ref 70–99)
Glucose-Capillary: 313 mg/dL — ABNORMAL HIGH (ref 70–99)

## 2019-10-29 LAB — TRIGLYCERIDES: Triglycerides: 76 mg/dL (ref <150)

## 2019-10-29 LAB — PHOSPHORUS: Phosphorus: 4.1 mg/dL (ref 2.5–4.6)

## 2019-10-29 LAB — PREALBUMIN: Prealbumin: 12.6 mg/dL — ABNORMAL LOW (ref 18–38)

## 2019-10-29 LAB — MAGNESIUM: Magnesium: 1.6 mg/dL — ABNORMAL LOW (ref 1.7–2.4)

## 2019-10-29 MED ORDER — BUPROPION HCL ER (SR) 100 MG PO TB12
100.0000 mg | ORAL_TABLET | Freq: Two times a day (BID) | ORAL | Status: DC
  Administered 2019-10-29 – 2019-10-31 (×5): 100 mg via ORAL
  Filled 2019-10-29 (×5): qty 1

## 2019-10-29 MED ORDER — TRAZODONE HCL 100 MG PO TABS: 200.0000 mg | ORAL_TABLET | Freq: Every evening | ORAL | Status: DC | PRN

## 2019-10-29 MED ORDER — SUCRALFATE 1 G PO TABS
1.0000 g | ORAL_TABLET | Freq: Three times a day (TID) | ORAL | Status: DC
  Administered 2019-10-29 – 2019-10-31 (×11): 1 g via ORAL
  Filled 2019-10-29 (×11): qty 1

## 2019-10-29 MED ORDER — POTASSIUM CHLORIDE 10 MEQ/100ML IV SOLN
10.0000 meq | INTRAVENOUS | Status: AC
  Administered 2019-10-29 (×3): 10 meq via INTRAVENOUS
  Filled 2019-10-29 (×3): qty 100

## 2019-10-29 MED ORDER — SUCRALFATE 1 G PO TABS: 1.0000 g | ORAL_TABLET | Freq: Three times a day (TID) | ORAL | Status: DC

## 2019-10-29 MED ORDER — TRAVASOL 10 % IV SOLN
INTRAVENOUS | Status: AC
  Administered 2019-10-29: 18:00:00 via INTRAVENOUS
  Filled 2019-10-29: qty 666

## 2019-10-29 MED ORDER — DICYCLOMINE HCL 10 MG PO CAPS
20.0000 mg | ORAL_CAPSULE | Freq: Three times a day (TID) | ORAL | Status: DC
  Administered 2019-10-29 – 2019-10-31 (×9): 20 mg via ORAL
  Filled 2019-10-29 (×9): qty 2

## 2019-10-29 MED ORDER — DIPHENOXYLATE-ATROPINE 2.5-0.025 MG PO TABS
1.0000 | ORAL_TABLET | Freq: Four times a day (QID) | ORAL | Status: DC | PRN
  Administered 2019-10-31: 1 via ORAL
  Filled 2019-10-29: qty 1

## 2019-10-29 MED ORDER — ESTRADIOL 2 MG PO TABS
2.0000 mg | ORAL_TABLET | Freq: Every day | ORAL | Status: DC
  Administered 2019-10-29 – 2019-10-31 (×3): 2 mg via ORAL
  Filled 2019-10-29 (×3): qty 1

## 2019-10-29 MED ORDER — MAGNESIUM SULFATE 2 GM/50ML IV SOLN
2.0000 g | Freq: Once | INTRAVENOUS | Status: AC
  Administered 2019-10-29: 2 g via INTRAVENOUS
  Filled 2019-10-29: qty 50

## 2019-10-29 MED ORDER — BUDESONIDE 3 MG PO CPEP
9.0000 mg | ORAL_CAPSULE | Freq: Every day | ORAL | Status: DC
  Administered 2019-10-29 – 2019-10-31 (×3): 9 mg via ORAL
  Filled 2019-10-29 (×3): qty 3

## 2019-10-29 MED ORDER — DULOXETINE HCL 60 MG PO CPEP
90.0000 mg | ORAL_CAPSULE | Freq: Every day | ORAL | Status: DC
  Administered 2019-10-29 – 2019-10-31 (×3): 90 mg via ORAL
  Filled 2019-10-29 (×3): qty 1

## 2019-10-29 MED ORDER — POTASSIUM CHLORIDE 10 MEQ/50ML IV SOLN
10.0000 meq | INTRAVENOUS | Status: DC
  Filled 2019-10-29 (×3): qty 50

## 2019-10-29 MED ORDER — PANCRELIPASE (LIP-PROT-AMYL) 12000-38000 UNITS PO CPEP
36000.0000 [IU] | ORAL_CAPSULE | Freq: Three times a day (TID) | ORAL | Status: DC
  Administered 2019-10-29 – 2019-10-31 (×9): 36000 [IU] via ORAL
  Filled 2019-10-29 (×9): qty 3

## 2019-10-29 NOTE — Evaluation (Signed)
Occupational Therapy Evaluation Patient Details Name: Caitlin Peters MRN: [id number] DOB: May 06, 1961 Today's Date: 10/29/2019    History of Present Illness Patient is a 58 y/o female admitted with generalized swelling after going off her TPN 4-5 weeks ago and keeping up with IV fluids daily.  She has PMH of Crohn's disease with 7 previous surgeries.   Clinical Impression   Pt admitted with the above diagnoses and presents with below problem list. PTA pt was mod I with most ADLs, supervision with shower transfers. Pt presents at/close to baseline with ADLs. Lives with daughter and 2 grandchildren. Discussed fall prevention. No further acute OT needs indicated. OT signing off.     Follow Up Recommendations  No OT follow up;Supervision - Intermittent    Equipment Recommendations  None recommended by OT    Recommendations for Other Services       Precautions / Restrictions Precautions Precautions: Fall Precaution Comments: reports 3-4 falls since moving here from [place], [place] last month, reports falls asleep during prolonged toileting due to Crohn's  Restrictions Weight Bearing Restrictions: No      Mobility Bed Mobility Overal bed mobility: Modified Independent                Transfers Overall transfer level: Needs assistance Equipment used: Rolling walker (2 wheeled) Transfers: Sit to/from Stand Sit to Stand: Supervision         General transfer comment: assist for safety with lines    Balance Overall balance assessment: Needs assistance   Sitting balance-Leahy Scale: Good     Standing balance support: No upper extremity supported Standing balance-Leahy Scale: Fair Standing balance comment: initially standing without device with S for balance                           ADL either performed or assessed with clinical judgement   ADL Overall ADL's : Needs assistance/impaired Eating/Feeding: Set up;Sitting   Grooming: Set up;Sitting   Upper Body  Bathing: Set up;Sitting   Lower Body Bathing: Supervison/ safety;Sit to/from stand   Upper Body Dressing : Set up;Sitting   Lower Body Dressing: Supervision/safety;Sit to/from stand   Toilet Transfer: Supervision/safety;Ambulation   Toileting- Clothing Manipulation and Hygiene: Supervision/safety   Tub/ Shower Transfer: Supervision/safety   Functional mobility during ADLs: Supervision/safety;Rolling walker General ADL Comments: Pt completed household distance functional mobility with good toleration.      Vision         Perception     Praxis      Pertinent Vitals/Pain Pain Assessment: Faces Faces Pain Scale: Hurts a little bit Pain Location: unspecified Pain Intervention(s): Monitored during session     Hand Dominance     Extremity/Trunk Assessment Upper Extremity Assessment Upper Extremity Assessment: Generalized weakness   Lower Extremity Assessment Lower Extremity Assessment: Defer to PT evaluation       Communication Communication Communication: No difficulties   Cognition Arousal/Alertness: Awake/alert Behavior During Therapy: WFL for tasks assessed/performed Overall Cognitive Status: Within Functional Limits for tasks assessed                                     General Comments       Exercises     Shoulder Instructions      Home Living Family/patient expects to be discharged to:: Private residence Living Arrangements: Children;Other (Comment)(daughter and 2 grandkids) Available Help at Discharge: Family;Available 24  hours/day Type of Home: Apartment Home Access: Stairs to enter Entrance Stairs-Number of Steps: flight Entrance Stairs-Rails: Right Home Layout: One level               Home Equipment: Walker - 4 wheels;Cane - single point          Prior Functioning/Environment Level of Independence: Needs assistance  Gait / Transfers Assistance Needed: grandsons help her on stairs, she uses cane vs rollator  independently for ambulation ADL's / Homemaking Assistance Needed: reports family has watched her in the shower and been available since she moved here   Comments: evidently was very active and independent prior to going off TPN, walked her dog 4-5 times a day        OT Problem List:        OT Treatment/Interventions:      OT Goals(Current goals can be found in the care plan section) Acute Rehab OT Goals Patient Stated Goal: to return to independent  OT Frequency:     Barriers to D/C:            Co-evaluation              AM-PAC OT "6 Clicks" Daily Activity     Outcome Measure Help from another person eating meals?: None Help from another person taking care of personal grooming?: None Help from another person toileting, which includes using toliet, bedpan, or urinal?: None Help from another person bathing (including washing, rinsing, drying)?: None Help from another person to put on and taking off regular upper body clothing?: None Help from another person to put on and taking off regular lower body clothing?: None 6 Click Score: 24   End of Session Equipment Utilized During Treatment: Rolling walker  Activity Tolerance: Patient tolerated treatment well Patient left: in bed;with call bell/phone within reach  OT Visit Diagnosis: Unsteadiness on feet (R26.81);Muscle weakness (generalized) (M62.81)                Time: 0131-4388 OT Time Calculation (min): 21 min Charges:  OT General Charges $OT Visit: 1 Visit OT Evaluation $OT Eval Low Complexity: [place], OT Acute Rehabilitation Services Pager: [phone] Office: [phone]   Hortencia Pilar 10/29/2019, 11:20 AM

## 2019-10-29 NOTE — Progress Notes (Signed)
OT Cancellation Note  Patient Details Name: Caitlin Peters MRN: [id number] DOB: 05/22/1961   Cancelled Treatment:    Reason Eval/Treat Not Completed: Other (comment). Pt requesting OT come back in a bit. Pt states she would like to rest a bit after having just eaten breakfast. Plan to reattempt.  Tyrone Schimke, OT Acute Rehabilitation Services Pager: [phone] Office: [phone]  10/29/2019, 9:28 AM

## 2019-10-29 NOTE — Progress Notes (Signed)
PROGRESS NOTE    Caitlin Peters  DGL:[id number] DOB: [birth year] DOA: 10/27/2019 PCP: [name], [name], MD     Brief Narrative:  Caitlin Peters is a 58 y.o. female with medical history significant of hypertension, Crohn's disease s/p multiple bowel resection surgeries with residual short gut syndrome, chronic anemia requiring blood transfusions, chronic pain syndrome on methadone, depression, and osteoporosis.  Patient presents with complaints of progressively worsening swelling of her face, arms, and legs.  She moved from [place], [place] at the beginning of November and has been staying with her daughter.  Ultimately she had to move here because she was unable to care for herself at home.  She reports that she had 7 previous bowel resections, and had been on TPN since 2003, and numerous line associated infections.  However, patient reportedly self stopped TPN in October prior to her move here.  She states that she was tired of the multiple line infections as the reason for stopping.  Since stopping the TPN, she reports that she has been falling more frequently, had generalized weakness, and lost approximately 30 pounds.  Reports having intermittent fevers that is more so chronic and diarrhea improved where she currently is only having 4 bowel movements per day on average. She reports having a GI appointment with Dr. Tarri Glenn ([facility]) on 11/25/2019.   New events last 24 hours / Subjective: Feeling much better since restarting TPN. She wanted to stop TPN and trial PO but realized that she does better on TPN. Worried regarding recurrent line infection.   Assessment & Plan:   Principal Problem:   Anasarca Active Problems:   Chronic diarrhea   Crohn's disease ([place])   Anemia   Hypokalemia due to excessive gastrointestinal loss of potassium   Crohn disease ([name])   Malnutrition ([name])   HTN (hypertension)   Chronic pain syndrome   Acute kidney injury superimposed on chronic kidney  disease ([place])   Falls   Anasarca suspected secondary to protein calorie malnutrition -Patient presented with swelling of the face and lower extremities.  Labs reveal significant hypoalbuminemia.  At this time suspect patient is likely third spacing due to poor nutritional status. -Patient agreeable to resume TPN   Acute kidney injury superimposed on chronic kidney disease stage IIIb -At baseline patient's creatinine had been around 1.5 previously -Improving, trend   Fever -Concern for bacteremia, has had left femoral PICC chronically -UA negative  -Blood culture pending  -Empiric vanco/zosyn   Crohn's disease, short gut syndrome, chronic diarrhea -Patient with history of 7 previous bowel resections due to Crohn's disease resulting in short syndrome.  Currently only reports having 4 bowel movements per day. -Continue lomotil, imodium, bentyl, entocort   Hypokalemia -Replace, trend   Hypomagnesemia -Replace, trend    Essential hypertension -Continue amlodipine, metoprolol  -BP stable   Falls -Patient reports having multiple falls at home.  Suspect secondary to weakness due to poor nutrition, but secondary causes include polypharmacy with pain medications. -PT/OT to eval  Chronic normocytic anemia -Baseline Hgb around 8  -Stable, no signs of bleeding, monitor   Depression -Continue Wellbutrin, trazodone   Chronic pain syndrome -She was referred to pain clinic as outpatient -Continue home regimen, dilaudid, methadone, cymbalta   S/p total hysterectomy -Continue estrace   GERD -Continue PPI    DVT prophylaxis: Subq hep  Code Status: Full Family Communication: None at bedside Disposition Plan: Pending clinical improvement   Consultants:   None  Procedures:   None  Antimicrobials:  Anti-infectives (From admission, onward)   Start     Dose/Rate Route Frequency Ordered Stop   10/29/19 0030  vancomycin (VANCOCIN) 1,250 mg in sodium chloride 0.9 %  250 mL IVPB     1,250 mg 166.7 mL/hr over 90 Minutes Intravenous Every 48 hours 10/28/19 2352     10/29/19 0000  piperacillin-tazobactam (ZOSYN) IVPB 3.375 g     3.375 g 12.5 mL/hr over 240 Minutes Intravenous Every 8 hours 10/28/19 2352          Objective: Vitals:   10/28/19 1515 10/28/19 2313 10/29/19 0113 10/29/19 0622  BP:  140/63 (!) 115/52 (!) 121/55  Pulse:  82 67 (!) 54  Resp: 20 17 18 18   Temp:  (!) 103 F (39.4 C) 99.5 F (37.5 C) 98 F (36.7 C)  TempSrc:  Oral Oral Oral  SpO2:  96% 100% 100%  Weight:  61 kg    Height:  5' 8"  (1.727 m)      Intake/Output Summary (Last 24 hours) at 10/29/2019 1128 Last data filed at 10/28/2019 2241 Gross per 24 hour  Intake 1099.93 ml  Output -  Net 1099.93 ml   Filed Weights   10/28/19 0909 10/28/19 0944 10/28/19 2313  Weight: 59.1 kg 59 kg 61 kg    Examination:  General exam: Appears calm and comfortable  Respiratory system: Clear to auscultation. Respiratory effort normal. No respiratory distress. No conversational dyspnea.  Cardiovascular system: S1 & S2 heard, RRR. No murmurs. Trace pedal edema. Gastrointestinal system: Abdomen is nondistended, soft and nontender. Normal bowel sounds heard. Central nervous system: Alert and oriented. No focal neurological deficits. Speech clear.  Extremities: Symmetric in appearance  Skin: No rashes, lesions or ulcers on exposed skin, PICC left femoral in place Psychiatry: Judgement and insight appear normal. Mood & affect appropriate.   Data Reviewed: I have personally reviewed following labs and imaging studies  CBC: Recent Labs  Lab 10/28/19 0427 10/29/19 0737  WBC 4.1 13.0*  NEUTROABS 2.2 11.6*  HGB 7.9* 7.3*  HCT 24.9* 22.0*  MCV 95.4 92.8  PLT 232 976*   Basic Metabolic Panel: Recent Labs  Lab 10/28/19 0427 10/28/19 0922 10/28/19 2222 10/29/19 0737  NA 141  --   --  139  K 3.1*  --   --  3.1*  CL 113*  --   --  113*  CO2 16*  --   --  17*  GLUCOSE 109*  --    --  115*  BUN 31*  --   --  28*  CREATININE 2.10*  --   --  1.95*  CALCIUM 8.3*  --   --  7.8*  MG  --  0.8* 1.7 1.6*  PHOS  --  3.9 1.6* 4.1   GFR: Estimated Creatinine Clearance: 30.3 mL/min (A) (by C-G formula based on SCr of 1.95 mg/dL (H)). Liver Function Tests: Recent Labs  Lab 10/28/19 0427 10/29/19 0737  AST 28 16  ALT 104* 60*  ALKPHOS 180* 148*  BILITOT 0.4 0.6  PROT 6.1* 5.2*  ALBUMIN 2.8* 2.2*   No results for input(s): LIPASE, AMYLASE in the last 168 hours. No results for input(s): AMMONIA in the last 168 hours. Coagulation Profile: No results for input(s): INR, PROTIME in the last 168 hours. Cardiac Enzymes: No results for input(s): CKTOTAL, CKMB, CKMBINDEX, TROPONINI in the last 168 hours. BNP (last 3 results) No results for input(s): PROBNP in the last 8760 hours. HbA1C: Recent Labs    10/28/19  2223  HGBA1C 4.7*   CBG: Recent Labs  Lab 10/28/19 1926 10/29/19 0017 10/29/19 0618  GLUCAP 114* 115* 121*   Lipid Profile: Recent Labs    10/29/19 0739  TRIG 76   Thyroid Function Tests: No results for input(s): TSH, T4TOTAL, FREET4, T3FREE, THYROIDAB in the last 72 hours. Anemia Panel: No results for input(s): VITAMINB12, FOLATE, FERRITIN, TIBC, IRON, RETICCTPCT in the last 72 hours. Sepsis Labs: No results for input(s): PROCALCITON, LATICACIDVEN in the last 168 hours.  Recent Results (from the past 240 hour(s))  SARS CORONAVIRUS 2 (TAT 6-24 HRS) Nasopharyngeal Nasopharyngeal Swab     Status: None   Collection Time: 10/28/19  4:50 AM   Specimen: Nasopharyngeal Swab  Result Value Ref Range Status   SARS Coronavirus 2 NEGATIVE NEGATIVE Final    Comment: (NOTE) SARS-CoV-2 target nucleic acids are NOT DETECTED. The SARS-CoV-2 RNA is generally detectable in upper and lower respiratory specimens during the acute phase of infection. Negative results do not preclude SARS-CoV-2 infection, do not rule out co-infections with other pathogens, and should  not be used as the sole basis for treatment or other patient management decisions. Negative results must be combined with clinical observations, patient history, and epidemiological information. The expected result is Negative. Fact Sheet for Patients: SugarRoll.be Fact Sheet for Healthcare Providers: https://www.woods-mathews.com/ This test is not yet approved or cleared by the [place] FDA and  has been authorized for detection and/or diagnosis of SARS-CoV-2 by FDA under an Emergency Use Authorization (EUA). This EUA will remain  in effect (meaning this test can be used) for the duration of the COVID-19 declaration under Section 56 4(b)(1) of the Act, 21 U.S.C. section 360bbb-3(b)(1), unless the authorization is terminated or revoked sooner. Performed at [facility], [place] [address], [place], [place] [address]       Radiology Studies: Dg Chest Port 1 View  Result Date: 10/28/2019 CLINICAL DATA:  Edema. New onset facial swelling. Patient complains of leg swelling for the last week. EXAM: PORTABLE CHEST 1 VIEW COMPARISON:  Two-view chest x-ray 10/12/2019 FINDINGS: The heart size and mediastinal contours are within normal limits. Both lungs are clear. The visualized skeletal structures are unremarkable. Right perihilar clips are again noted. IMPRESSION: Negative one-view chest x-ray Electronically Signed   By: San Morelle M.D.   On: 10/28/2019 05:00      Scheduled Meds: . amLODipine  5 mg Oral Daily  . budesonide  9 mg Oral Daily  . buPROPion  100 mg Oral BID  . carbamide peroxide  5 drop Left EAR BID  . Chlorhexidine Gluconate Cloth  6 each Topical Daily  . cycloSPORINE  1 drop Both Eyes BID  . dicyclomine  20 mg Oral TID AC  . DULoxetine  90 mg Oral Daily  . estradiol  2 mg Oral Daily  . heparin injection (subcutaneous)  5,000 Units Subcutaneous Q8H  . insulin aspart  0-9 Units Subcutaneous Q6H  .  lipase/protease/amylase  36,000 Units Oral TID AC  . methadone  20 mg Oral Q12H  . metoprolol tartrate  100 mg Oral BID  . pantoprazole  40 mg Oral BID  . potassium chloride SA  40 mEq Oral Daily  . sodium chloride flush  3 mL Intravenous Once  . sodium chloride flush  3 mL Intravenous Q12H  . sucralfate  1 g Oral TID AC & HS   Continuous Infusions: . magnesium sulfate bolus IVPB    . piperacillin-tazobactam (ZOSYN)  IV 3.375 g (  10/29/19 0644)  . potassium chloride    . TPN ADULT (ION) 30 mL/hr at 10/28/19 1712  . TPN ADULT (ION)    . vancomycin 1,250 mg (10/29/19 0512)     LOS: 1 day      Time spent: 35 minutes   Dessa Phi, DO [facility] 10/29/2019, 11:28 AM   Available via Epic secure chat 7am-7pm After these hours, please refer to coverage provider listed on [email or web address]

## 2019-10-29 NOTE — Progress Notes (Signed)
Initial Nutrition Assessment  DOCUMENTATION CODES:   Not applicable  INTERVENTION:   -TPN management per pharmacy -Continue heart healthy diet   NUTRITION DIAGNOSIS:   Increased nutrient needs related to chronic illness(short gut syndrome, crohn's disease) as evidenced by estimated needs.  GOAL:   Patient will meet greater than or equal to 90% of their needs  MONITOR:   PO intake, Weight trends, Skin, Labs, I & O's  REASON FOR ASSESSMENT:   Malnutrition Screening Tool, Consult New TPN/TNA  ASSESSMENT:   Caitlin Peters is a 58 y.o. female with medical history significant of hypertension, Crohn's disease s/p multiple with residual short gut syndrome, chronic anemia requiring blood transfusions, chronic pain syndrome, depression, and osteoporosis.  Patient presents with complaints of progressively worsening swelling of her face, arms, and legs.  She moved from [place], [place] at the beginning of November and has been staying with her daughter.  Ultimately she had to move here because she was unable to care for herself at home.  She reports that she had 7 previous bowel resections, and had been on TPN since 2003.  However, patient reportedly self stopped TPN in October prior to her move here.  She states that she was tired of the multiple line infections as the reason for stopping.  Since stopping the TPN she reports that she has been falling more frequently, had generalized weakness, and lost approximately 30 pounds.  Reports having intermittent fevers that is more so chronic and diarrhea improved where she currently is only having 4 bowel movements per day on average.  She is in the process of establishing care with a PCP, but still needs a pain management specialist.  She reports having chronic abdominal pain from her previous surgeries.  Denies having any blood in stools, vomiting, chest pain, shortness of breath, or cough symptoms.  She was last admitted in the hospital in November at  [facility] for diarrhea, but at that time TPN had not been restarted.  Pt admitted with anasarca suspected secondary to protein calorie malnutrition.   Reviewed I/O's: +1.1 L x 24 hours  Per chart review, pt with history of crohn's disease, short gut syndrome, and diarrhea. Pt has had 7 previous bowel resections secondary to crohn's disease.   Case discussed with RNCM and RN. Pt discontinued TPN approximately 2 months PTA secondary to recurrent line infections. She recently relocated to [facility] from [place] due to functional decline (pt now lives with her daughter). Pt agreeable to re-start TPN.   Spoke with pt at bedside, who was eating lunch at time of visit. She reports she has a great appetite (noted pt consumed approximately 50% of breakfast). Pt refused RD interview and nutrition-focused physical exam ("I want to eat now and I do not like cold food").   Pt currently receiving TPN at 30 ml/hr, which provides 738 kcals and 40 grams protein, which meets 40% of estimated kcal needs and 42% of estimated protein needs. Per pharmacy notes, plan to increase TPN to 50 ml/hr at 1800, which provides 1230 kcals and 67 grams protein, which meets 66% of estimated kcal needs and 71% of estimated protein needs.   Wt has been stable over the past month. Pt reports a 30# wt loss per chart review. Per documented wt hx, wt has been stable over the past month. Pt is at high risk for malnutrition given pt stopping TPN. Due to short gut gut syndrome, suspect pt is unable to absorb adequate nutrition via PO route and  will continue to require TPN to maximize nutritional status.   Labs reviewed: K: 3.1, Mg: 1.6, CBGS: 121 (inpatient orders for glycemic control are 0-9 units insulin aspart every 6 hours).   Diet Order:   Diet Order            Diet Heart Room service appropriate? Yes; Fluid consistency: Thin  Diet effective now              EDUCATION NEEDS:   No education needs have been identified at this  time  Skin:  Skin Assessment: Reviewed RN Assessment  Last BM:  10/29/19  Height:   Ht Readings from Last 1 Encounters:  10/28/19 5' 8"  (1.727 m)    Weight:   Wt Readings from Last 1 Encounters:  10/28/19 61 kg    Ideal Body Weight:  63.6 kg  BMI:  Body mass index is 20.44 kg/m.  Estimated Nutritional Needs:   Kcal:  1850-2050  Protein:  95-110 grams  Fluid:  > 1.8 L   Caitlin Noah A. Caitlin Peters, RD, LDN, [place] Registered Dietitian II Certified Diabetes Care and Education Specialist Pager: [phone] After hours Pager: [phone]

## 2019-10-29 NOTE — Progress Notes (Addendum)
PHARMACY - TOTAL PARENTERAL NUTRITION CONSULT NOTE  Indication: Short gut syndrome  Patient Measurements: Height: 5' 8"  (172.7 cm) Weight: 134 lb 6.4 oz (61 kg) IBW/kg (Calculated) : 63.9 TPN AdjBW (KG): 61 Body mass index is 20.44 kg/m. Usual Weight: 150-155 lbs (68-70 kg)  Assessment:  58 YOF presented 10/27/19 with bilateral leg and eye swelling.  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  Patient took herself off of TPN to see if she could sustain without it.  She is tired of frequent line infections and hospitalizations.  MD suspects anasarca is secondary to [facility] and Pharmacy has been consulted to restart TPN.  Patient reports that she receives TPN only 2 times a week and on days that she does not get TPN she receives 1L of D5W [facility] pharmacist reports D51/2NS and TPN 3 times per week).  She also endorses a 30-lb weight loss in 30-45 days since she was off TPN.  Patient is at risk for refeeding syndrome.  Glucose / Insulin: no hx DM, A1c 4.7% - CBGs controlled, minimal SSI use Electrolytes: K 3.1 post 30mq PO total (471m PO daily), high CL and low CO2, Mag up to 1.6 post 4gm, Phos 4.1 (just completed NaPhos 20 mmol ~30 min prior to lab draw, anticipate it trending down) Renal: AKI - SCr down 1.95, BUN down 28 LFTs / TGs: alk phos/ALT improved to 148/60, AST / tbili / TG WNL Prealbumin / albumin: BL prealbumin slightly low at 12.6, albumin 2.2 Intake / Output; MIVF: BM x2, D5W 1L daily, net +1L GI Imaging: N/A Surgeries / Procedures: N/A  Central access: PICC placed 10/12/19 (recent line fxn and line replaced) TPN start date: 10/28/19  Nutritional Goals (RD rec pending): 1750-2000 kCal, 90-105gm protein per day  Current Nutrition:  Heart diet TPN  [facility]:  AdFabio [phone] in Sept 2020:  TPN 3 times per week, 150039mver 12 hrs, no lipid = 428 kCal, 80gm AA.  D51/2NS 1L on days not on  TPN. [place] without [place]PN  Plan:  Increase TPN to 50 ml/hr at 1800 (goal rate ~75 ml/hr) TPN will provide 67g AA, 174g CHO and 37g ILE for a total of 1230 kCal, meeting ~66% of patient needs Electrolytes in TPN: increase all lytes with increased TPN rate, max acetate Add standard MVI and trace elements to TPN Continue sensitive SSI Q6H and adjust as needed Hold D5W 1L daily for now per discussion with Dr. ChoMaylene Roesn daily TPN currently KCL x 3 runs (continue KCL 57m75maily as ordered) Mag sulfate 2gm IV x 1 F/U AM labs, cycle TPN once stable on goal rate  Niza Soderholm D. DangMina MarblearmD, BCPS, BCCCSoperton3/2020, 10:26 AM

## 2019-10-29 NOTE — TOC Initial Note (Addendum)
Transition of Care [facility]) - Initial/Assessment Note    Patient Details  Name: Caitlin Peters MRN: [id number] Date of Birth: 10/16/1961  Transition of Care [facility]) CM/SW Contact:    Marilu Favre, RN Phone Number: 10/29/2019, 12:21 PM  Clinical Narrative:                  Confirmed face sheet information with patient at bedside. Patient recently moved here from [place] to live with her daughter. She is active with [facility] home [facility], will need orders for resumption. Await PT/OT evals for any DME or PT recommendations for home. Patient stopped TPN on her own while she was in [place] due to frequent line infections. She does want to restart home TPN. Notified Cassie with [facility] of admission. Pam with [facility] will also follow.  In [place] patient was one TPN two days a week and hydration the other days. Will need MD order.   Expected Discharge Plan: [place]     Patient Goals and CMS Choice Patient states their goals for this hospitalization and ongoing recovery are:: to go home CMS Medicare.gov Compare Post Acute Care list provided to:: Patient Choice offered to / list presented to : Patient  Expected Discharge Plan and Services Expected Discharge Plan: [place]   Discharge Planning Services: CM Consult Post Acute Care Choice: [place] arrangements for the past 2 months: Apartment                 DME Arranged: N/A                    Prior Living Arrangements/Services Living arrangements for the past 2 months: Apartment Lives with:: Adult Children Patient language and need for interpreter reviewed:: Yes Do you feel safe going back to the place where you live?: Yes      Need for Family Participation in Patient Care: Yes (Comment) Care giver support system in place?: Yes (comment) Current home services: Home RN Criminal Activity/Legal Involvement Pertinent to Current Situation/Hospitalization: No - Comment as  needed  Activities of Daily Living Home Assistive Devices/Equipment: Cane (specify quad or straight), Walker (specify type), Eyeglasses ADL Screening (condition at time of admission) Patient's cognitive ability adequate to safely complete daily activities?: Yes Is the patient deaf or have difficulty hearing?: Yes Does the patient have difficulty seeing, even when wearing glasses/contacts?: No Does the patient have difficulty concentrating, remembering, or making decisions?: Yes Patient able to express need for assistance with ADLs?: Yes Does the patient have difficulty dressing or bathing?: No Independently performs ADLs?: Yes (appropriate for developmental age) Does the patient have difficulty walking or climbing stairs?: No Weakness of Legs: Both Weakness of Arms/Hands: None  Permission Sought/Granted   Permission granted to share information with : Yes, Verbal Permission Granted     Permission granted to share info w AGENCY: [facility] and [facility]        Emotional Assessment Appearance:: Appears stated age Attitude/Demeanor/Rapport: Engaged Affect (typically observed): Accepting Orientation: : Oriented to Self, Oriented to Place, Oriented to  Time, Oriented to Situation Alcohol / Substance Use: Not Applicable Psych Involvement: No (comment)  Admission diagnosis:  Anasarca [R60.1] AKI (acute kidney injury) ([place]) [N17.9] Proteinuria, unspecified type [R80.9] Patient Active Problem List   Diagnosis Date Noted  . Anasarca 10/28/2019  . Acute kidney injury superimposed on chronic kidney disease ([place]) 10/28/2019  . Falls 10/28/2019  . Crohn disease ([place])   .  Malnutrition ([place])   . Short gut syndrome   . HTN (hypertension)   . Vitamin B12 deficiency   . Osteoporosis   . Depression   . GERD (gastroesophageal reflux disease)   . Chronic pain syndrome   . Hypokalemia due to excessive gastrointestinal loss of potassium 10/13/2019  . Diarrhea   . Fever   .  Hypokalemia 10/12/2019  . Chronic diarrhea 10/12/2019  . Dysuria 10/12/2019  . Bilateral lower extremity edema 10/12/2019  . Crohn's disease ([place]) 10/12/2019  . Anemia 10/12/2019  . Hypertension 10/12/2019  . AKI (acute kidney injury) ([place]) 10/12/2019   PCP:  [name], [name], MD Pharmacy:   [facility] [address] [place] [address] Phone: [phone] Fax: [phone]     Social Determinants of Health (SDOH) Interventions    Readmission Risk Interventions No flowsheet data found.

## 2019-10-29 NOTE — Progress Notes (Signed)
PT Cancellation Note  Patient Details Name: Caitlin Peters MRN: [id number] DOB: 1961/06/16   Cancelled Treatment:    Reason Eval/Treat Not Completed: Patient declined, no reason specified. Pt refusing PT x2 attempts. Pt initially wanting to eat her lunch and stating that she had already seen PT and OT and she doesn't need PT and was signed off. Attempted second time and explained PT needed to make sure she was safe on stairs before she went home. Pt reported she was fine on the stairs and has multiple family members who help her. Pt stated she uses the handrail and has family in front, to the side and behind her when she performs stairs. Pt states she has had enough falls and issues so she is not going to fall going up the stairs. Pt asking to be signed off from PT caseload.  Zachary George PT, DPT 1:42 PM,10/29/19  Sahid Borba Drucilla Chalet 10/29/2019, 1:39 PM

## 2019-10-30 LAB — PHOSPHORUS: Phosphorus: 2.8 mg/dL (ref 2.5–4.6)

## 2019-10-30 LAB — BASIC METABOLIC PANEL
Anion gap: 7 (ref 5–15)
BUN: 29 mg/dL — ABNORMAL HIGH (ref 6–20)
CO2: 16 mmol/L — ABNORMAL LOW (ref 22–32)
Calcium: 8.3 mg/dL — ABNORMAL LOW (ref 8.9–10.3)
Chloride: 113 mmol/L — ABNORMAL HIGH (ref 98–111)
Creatinine, Ser: 1.94 mg/dL — ABNORMAL HIGH (ref 0.44–1.00)
GFR calc Af Amer: 32 mL/min — ABNORMAL LOW (ref >60)
GFR calc non Af Amer: 28 mL/min — ABNORMAL LOW (ref >60)
Glucose, Bld: 111 mg/dL — ABNORMAL HIGH (ref 70–99)
Potassium: 3.8 mmol/L (ref 3.5–5.1)
Sodium: 136 mmol/L (ref 135–145)

## 2019-10-30 LAB — CBC
HCT: 21.1 % — ABNORMAL LOW (ref 36.0–46.0)
Hemoglobin: 6.9 g/dL — CL (ref 12.0–15.0)
MCH: 30.3 pg (ref 26.0–34.0)
MCHC: 32.7 g/dL (ref 30.0–36.0)
MCV: 92.5 fL (ref 80.0–100.0)
Platelets: 125 10*3/uL — ABNORMAL LOW (ref 150–400)
RBC: 2.28 MIL/uL — ABNORMAL LOW (ref 3.87–5.11)
RDW: 14.9 % (ref 11.5–15.5)
WBC: 11.2 10*3/uL — ABNORMAL HIGH (ref 4.0–10.5)
nRBC: 0 % (ref 0.0–0.2)

## 2019-10-30 LAB — IRON AND TIBC
Iron: 11 ug/dL — ABNORMAL LOW (ref 28–170)
Saturation Ratios: 9 % — ABNORMAL LOW (ref 10.4–31.8)
TIBC: 123 ug/dL — ABNORMAL LOW (ref 250–450)
UIBC: 112 ug/dL

## 2019-10-30 LAB — PREPARE RBC (CROSSMATCH)

## 2019-10-30 LAB — GLUCOSE, CAPILLARY
Glucose-Capillary: 116 mg/dL — ABNORMAL HIGH (ref 70–99)
Glucose-Capillary: 120 mg/dL — ABNORMAL HIGH (ref 70–99)
Glucose-Capillary: 128 mg/dL — ABNORMAL HIGH (ref 70–99)
Glucose-Capillary: 131 mg/dL — ABNORMAL HIGH (ref 70–99)
Glucose-Capillary: 138 mg/dL — ABNORMAL HIGH (ref 70–99)
Glucose-Capillary: 232 mg/dL — ABNORMAL HIGH (ref 70–99)

## 2019-10-30 LAB — FERRITIN: Ferritin: 223 ng/mL (ref 11–307)

## 2019-10-30 LAB — HEMOGLOBIN AND HEMATOCRIT, BLOOD
HCT: 26.3 % — ABNORMAL LOW (ref 36.0–46.0)
Hemoglobin: 8.4 g/dL — ABNORMAL LOW (ref 12.0–15.0)

## 2019-10-30 LAB — ABO/RH: ABO/RH(D): O POS

## 2019-10-30 LAB — MAGNESIUM: Magnesium: 2.1 mg/dL (ref 1.7–2.4)

## 2019-10-30 MED ORDER — TRAVASOL 10 % IV SOLN
INTRAVENOUS | Status: AC
  Administered 2019-10-30: 18:00:00 via INTRAVENOUS
  Filled 2019-10-30: qty 1044

## 2019-10-30 MED ORDER — SODIUM CHLORIDE 0.9% IV SOLUTION
Freq: Once | INTRAVENOUS | Status: AC
  Administered 2019-10-30: 10:00:00 via INTRAVENOUS

## 2019-10-30 NOTE — Progress Notes (Signed)
Follow up with phlebotomist type and screen order that is not drawn yet. Said she's aware of said order.

## 2019-10-30 NOTE — Progress Notes (Signed)
PHARMACY - TOTAL PARENTERAL NUTRITION CONSULT NOTE  Indication: Short gut syndrome  Patient Measurements: Height: _0  (172.7 cm) Weight: 134 lb 6.4 oz (61 kg) IBW/kg (Calculated) : 63.9 TPN AdjBW (KG): 61 Body mass index is 20.44 kg/m. Usual Weight: 150-155 lbs (68-70 kg)  Assessment:  58 YOF presented 10/27/19 with bilateral leg and eye swelling.  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  Patient took herself off of TPN to see if she could sustain without it.  She is tired of frequent line infections and hospitalizations.  MD suspects anasarca is secondary to protein calorie malnutrition and pharmacy has been consulted to restart TPN.  Patient reports that she receives TPN only 2 times a week and on days that she does not get TPN she receives 1L of D5W [facility] pharmacist reports D51/2NS and TPN 3 times per week).  She also endorses a 30-lb weight loss in 30-45 days since she was off TPN.  Patient is at risk for refeeding syndrome.  Glucose / Insulin: no hx DM, A1c 4.7% - CBGs controlled, minimal SSI use Electrolytes: K 3.8 post 18mq IV and 428m PO, high CL and low CO2, Mag up to 2 after 2gm yesterday, Phos 2.8 Renal: AKI - SCr down 1.94, BUN down 29 LFTs / TGs: alk phos/ALT improved to 148/60, AST / tbili / TG WNL Prealbumin / albumin: Baseline prealbumin slightly low at 12.6, albumin 2.2 Intake / Output; MIVF: bowel movement x2 last on 12/3, Unmeasured urine occurrences x4 GI Imaging: N/A Surgeries / Procedures: N/A  Central access: PICC placed 10/12/19 (recent line fxn and line replaced) TPN start date: 10/28/19  Nutritional Goals (RD recommendations on 12/4): 1850-2050 kCal, 95-110gm protein per day Goal TPN rate is 75 mL/hr (provides 104 g of protein and 1863 kcals per day)   Current Nutrition:  Heart diet -100% intake TPN  [facility]:  AdFabio [phone] in Sept 2020:  TPN 3 times per  week, 150092mver 12 hrs, no lipid = 428 kCal, 80gm AA.  D51/2NS 1L on days not on TPN. [place] without TPN  Plan:  Increase TPN to goal rate of 75 ml/hr at 1800. TPN will provide 104g AA, 261g CHO and 55.8g ILE for a total of 1863 kCal, meeting ~100% of patient needs Electrolytes in TPN: increase all lytes with increased TPN rate, max acetate Add standard MVI and trace elements to TPN Continue sensitive SSI Q6H and adjust as needed Continue KCL 42m31maily as ordered F/U AM labs, cycle TPN once stable on goal rate  JessSloan LeiterarmD, BCPS, BCCCP Clinical Pharmacist Please refer to [facility] 10/30/2019, 9:19 AM

## 2019-10-30 NOTE — Progress Notes (Signed)
CRITICAL VALUE ALERT Critical Value: Hg 6.9  Date & Time Notied: 10/30/19   5901  Provider Notified: X. Blount, NP  Orders Received/Actions taken: Yes

## 2019-10-30 NOTE — Plan of Care (Signed)
  Problem: Health Behavior/Discharge Planning: Goal: Ability to manage health-related needs will improve Outcome: Progressing   Problem: Activity: Goal: Risk for activity intolerance will decrease Outcome: Progressing   Problem: Nutrition: Goal: Adequate nutrition will be maintained Outcome: Progressing

## 2019-10-30 NOTE — Progress Notes (Signed)
PROGRESS NOTE    Caitlin Peters  OJJ:[id number] DOB: 08-05-1961 DOA: 10/27/2019 PCP: [name], [name], MD     Brief Narrative:  Caitlin Peters is a 58 y.o. female with medical history significant of hypertension, Crohn's disease s/p multiple bowel resection surgeries with residual short gut syndrome, chronic anemia requiring blood transfusions, chronic pain syndrome on methadone, depression, and osteoporosis.  Patient presents with complaints of progressively worsening swelling of her face, arms, and legs.  She moved from [place], [place] at the beginning of November and has been staying with her daughter.  Ultimately she had to move here because she was unable to care for herself at home.  She reports that she had 7 previous bowel resections, and had been on TPN since 2003, and numerous line associated infections.  However, patient reportedly self stopped TPN in October prior to her move here.  She states that she was tired of the multiple line infections as the reason for stopping.  Since stopping the TPN, she reports that she has been falling more frequently, had generalized weakness, and lost approximately 30 pounds.  Reports having intermittent fevers that is more so chronic and diarrhea improved where she currently is only having 4 bowel movements per day on average. She reports having a GI appointment with Dr. Tarri Glenn ([facility]) on 11/25/2019.   New events last 24 hours / Subjective: Continues to feel better.  Tolerating diet without any issues.  Diarrhea is at her baseline.  Peripheral edema has also improved.  Did not notice any bleeding.  She states that she has had anemia chronically due to iron deficiency.  Assessment & Plan:   Principal Problem:   Anasarca Active Problems:   Chronic diarrhea   Crohn's disease ([place])   Anemia   Hypokalemia due to excessive gastrointestinal loss of potassium   Crohn disease ([name])   Malnutrition ([name])   HTN (hypertension)   Chronic pain  syndrome   Acute kidney injury superimposed on chronic kidney disease ([place])   Falls   Anasarca suspected secondary to protein calorie malnutrition -Patient presented with swelling of the face and lower extremities.  Labs reveal significant hypoalbuminemia.  At this time suspect patient is likely third spacing due to poor nutritional status. -Patient agreeable to resume TPN  -Improving edema   Acute kidney injury superimposed on chronic kidney disease stage IIIb -At baseline patient's creatinine had been around 1.5 previously -Improving, trend   Fever -Initial concern for bacteremia, has had left femoral PICC chronically -UA negative  -Blood culture negative to date -Afebrile.  Stop empiric antibiotics and watch for fevers.  Crohn's disease, short gut syndrome, chronic diarrhea -Patient with history of 7 previous bowel resections due to Crohn's disease resulting in short syndrome.  Currently only reports having 4 bowel movements per day. -Continue lomotil, imodium, bentyl, entocort  -Stable    Essential hypertension -Continue amlodipine, metoprolol  -BP stable   Falls -Patient reports having multiple falls at home.  Suspect secondary to weakness due to poor nutrition, but secondary causes include polypharmacy with pain medications. -Has refused to work with PT OT   Chronic normocytic anemia -Baseline Hgb around 8  -Hgb fell to 6.9 this morning, no acute sign of bleeding -Transfuse 1u pRBC -Check iron, ferritin   Depression -Continue Wellbutrin, trazodone   Chronic pain syndrome -She was referred to pain clinic as outpatient -Continue home regimen, dilaudid, methadone, cymbalta   S/p total hysterectomy -Continue estrace   GERD -Continue PPI  DVT prophylaxis: Subq hep  Code Status: Full Family Communication: None at bedside Disposition Plan: Pending clinical improvement, home health on discharge    Consultants:   None  Procedures:   None    Antimicrobials:  Anti-infectives (From admission, onward)   Start     Dose/Rate Route Frequency Ordered Stop   10/29/19 0030  vancomycin (VANCOCIN) 1,250 mg in sodium chloride 0.9 % 250 mL IVPB     1,250 mg 166.7 mL/hr over 90 Minutes Intravenous Every 48 hours 10/28/19 2352     10/29/19 0000  piperacillin-tazobactam (ZOSYN) IVPB 3.375 g     3.375 g 12.5 mL/hr over 240 Minutes Intravenous Every 8 hours 10/28/19 2352         Objective: Vitals:   10/29/19 2052 10/30/19 0431 10/30/19 0937 10/30/19 1001  BP: (!) 92/47 123/64 123/65 128/77  Pulse: 66 60 64 70  Resp: 20 18 18 18   Temp: 99.3 F (37.4 C) 98.4 F (36.9 C) 98.5 F (36.9 C) 98.1 F (36.7 C)  TempSrc: Oral Oral Oral Oral  SpO2: 100% 100% 100% 100%  Weight:      Height:        Intake/Output Summary (Last 24 hours) at 10/30/2019 1256 Last data filed at 10/30/2019 0600 Gross per 24 hour  Intake 2251.22 ml  Output -  Net 2251.22 ml   Filed Weights   10/28/19 0909 10/28/19 0944 10/28/19 2313  Weight: 59.1 kg 59 kg 61 kg    Examination: General exam: Appears calm and comfortable  Respiratory system: Clear to auscultation. Respiratory effort normal. Cardiovascular system: S1 & S2 heard, RRR. No pedal edema. Gastrointestinal system: Abdomen is nondistended, soft and nontender. Normal bowel sounds heard. Central nervous system: Alert and oriented. Non focal exam. Speech clear  Extremities: Symmetric in appearance bilaterally  Skin: No rashes, lesions or ulcers on exposed skin  Psychiatry: Judgement and insight appear stable. Mood & affect appropriate.    Data Reviewed: I have personally reviewed following labs and imaging studies  CBC: Recent Labs  Lab 10/28/19 0427 10/29/19 0737 10/30/19 0157  WBC 4.1 13.0* 11.2*  NEUTROABS 2.2 11.6*  --   HGB 7.9* 7.3* 6.9*  HCT 24.9* 22.0* 21.1*  MCV 95.4 92.8 92.5  PLT 232 136* 786*   Basic Metabolic Panel: Recent Labs  Lab 10/28/19 0427 10/28/19 0922 10/28/19  2222 10/29/19 0737 10/30/19 0157  NA 141  --   --  139 136  K 3.1*  --   --  3.1* 3.8  CL 113*  --   --  113* 113*  CO2 16*  --   --  17* 16*  GLUCOSE 109*  --   --  115* 111*  BUN 31*  --   --  28* 29*  CREATININE 2.10*  --   --  1.95* 1.94*  CALCIUM 8.3*  --   --  7.8* 8.3*  MG  --  0.8* 1.7 1.6* 2.1  PHOS  --  3.9 1.6* 4.1 2.8   GFR: Estimated Creatinine Clearance: 30.4 mL/min (A) (by C-G formula based on SCr of 1.94 mg/dL (H)). Liver Function Tests: Recent Labs  Lab 10/28/19 0427 10/29/19 0737  AST 28 16  ALT 104* 60*  ALKPHOS 180* 148*  BILITOT 0.4 0.6  PROT 6.1* 5.2*  ALBUMIN 2.8* 2.2*   No results for input(s): LIPASE, AMYLASE in the last 168 hours. No results for input(s): AMMONIA in the last 168 hours. Coagulation Profile: No results for input(s): INR, PROTIME in  the last 168 hours. Cardiac Enzymes: No results for input(s): CKTOTAL, CKMB, CKMBINDEX, TROPONINI in the last 168 hours. BNP (last 3 results) No results for input(s): PROBNP in the last 8760 hours. HbA1C: Recent Labs    10/28/19 2223  HGBA1C 4.7*   CBG: Recent Labs  Lab 10/29/19 1721 10/30/19 0032 10/30/19 0041 10/30/19 0516 10/30/19 1142  GLUCAP 125* 232* 138* 120* 131*   Lipid Profile: Recent Labs    10/29/19 0739  TRIG 76   Thyroid Function Tests: No results for input(s): TSH, T4TOTAL, FREET4, T3FREE, THYROIDAB in the last 72 hours. Anemia Panel: No results for input(s): VITAMINB12, FOLATE, FERRITIN, TIBC, IRON, RETICCTPCT in the last 72 hours. Sepsis Labs: No results for input(s): PROCALCITON, LATICACIDVEN in the last 168 hours.  Recent Results (from the past 240 hour(s))  SARS CORONAVIRUS 2 (TAT 6-24 HRS) Nasopharyngeal Nasopharyngeal Swab     Status: None   Collection Time: 10/28/19  4:50 AM   Specimen: Nasopharyngeal Swab  Result Value Ref Range Status   SARS Coronavirus 2 NEGATIVE NEGATIVE Final    Comment: (NOTE) SARS-CoV-2 target nucleic acids are NOT DETECTED. The  SARS-CoV-2 RNA is generally detectable in upper and lower respiratory specimens during the acute phase of infection. Negative results do not preclude SARS-CoV-2 infection, do not rule out co-infections with other pathogens, and should not be used as the sole basis for treatment or other patient management decisions. Negative results must be combined with clinical observations, patient history, and epidemiological information. The expected result is Negative. Fact Sheet for Patients: SugarRoll.be Fact Sheet for Healthcare Providers: https://www.woods-mathews.com/ This test is not yet approved or cleared by the [place] FDA and  has been authorized for detection and/or diagnosis of SARS-CoV-2 by FDA under an Emergency Use Authorization (EUA). This EUA will remain  in effect (meaning this test can be used) for the duration of the COVID-19 declaration under Section 56 4(b)(1) of the Act, 21 U.S.C. section 360bbb-3(b)(1), unless the authorization is terminated or revoked sooner. Performed at [facility], [place] [address] [place], [place] [address]   Culture, blood (routine x 2)     Status: None (Preliminary result)   Collection Time: 10/29/19 12:17 AM   Specimen: BLOOD LEFT FOREARM  Result Value Ref Range Status   Specimen Description BLOOD LEFT FOREARM  Final   Special Requests   Final    BOTTLES DRAWN AEROBIC ONLY Blood Culture results may not be optimal due to an inadequate volume of blood received in culture bottles   Culture   Final    NO GROWTH 1 DAY Performed at [facility], [place] [address], [place], [place] [address]    Report Status PENDING  Incomplete  Culture, blood (routine x 2)     Status: None (Preliminary result)   Collection Time: 10/29/19 12:23 AM   Specimen: BLOOD LEFT HAND  Result Value Ref Range Status   Specimen Description BLOOD LEFT HAND  Final   Special Requests   Final    BOTTLES DRAWN AEROBIC AND  ANAEROBIC Blood Culture adequate volume   Culture   Final    NO GROWTH 1 DAY Performed at [facility], [place] [address] [place], [place] [address]    Report Status PENDING  Incomplete      Radiology Studies: No results found.    Scheduled Meds: . amLODipine  5 mg Oral Daily  . budesonide  9 mg Oral Daily  . buPROPion  100 mg Oral BID  .  carbamide peroxide  5 drop Left EAR BID  . Chlorhexidine Gluconate Cloth  6 each Topical Daily  . cycloSPORINE  1 drop Both Eyes BID  . dicyclomine  20 mg Oral TID AC  . DULoxetine  90 mg Oral Daily  . estradiol  2 mg Oral Daily  . heparin injection (subcutaneous)  5,000 Units Subcutaneous Q8H  . insulin aspart  0-9 Units Subcutaneous Q6H  . lipase/protease/amylase  36,000 Units Oral TID AC  . methadone  20 mg Oral Q12H  . metoprolol tartrate  100 mg Oral BID  . pantoprazole  40 mg Oral BID  . potassium chloride SA  40 mEq Oral Daily  . sodium chloride flush  3 mL Intravenous Once  . sodium chloride flush  3 mL Intravenous Q12H  . sucralfate  1 g Oral TID AC & HS   Continuous Infusions: . piperacillin-tazobactam (ZOSYN)  IV 3.375 g (10/30/19 0518)  . TPN ADULT (ION) 50 mL/hr at 10/29/19 1734  . TPN ADULT (ION)    . vancomycin Stopped (10/29/19 3818)     LOS: 2 days      Time spent: 25 minutes   Dessa Phi, DO [facility] 10/30/2019, 12:56 PM   Available via Epic secure chat 7am-7pm After these hours, please refer to coverage provider listed on [email or web address]

## 2019-10-30 NOTE — Progress Notes (Signed)
OT Cancellation Note  Patient Details Name: WRENLEY SAYED MRN: [id number] DOB: 1960-12-13   Cancelled Treatment:   New OT order received 10/29/2019.  OT eval completed prior to second order placed. All OT needs were addressed at that time with no further OT needs identified.   Lucille Passy, OTR/L [place] Pager [phone] Office [phone]     Lucille Passy M 10/30/2019, 5:24 AM

## 2019-10-30 NOTE — Progress Notes (Signed)
Physical Therapy Treatment Patient Details Name: Caitlin Peters MRN: [id number] DOB: Apr 25, 1961 Today's Date: 10/30/2019    History of Present Illness Patient is a 58 y/o female admitted with generalized swelling after going off her TPN 4-5 weeks ago and keeping up with IV fluids daily.  She has PMH of Crohn's disease with 7 previous surgeries.    PT Comments    On arrival to room RN present and informed PTA that pt had just started blood transfusion for low Hgb. Pt not medically ready to participate with therapy however pt had several questions about d/c, equipment, and home care. Took time to address all pt questions and updated equipment recommendations to include shower chair with a back, as pt has moved recently and no longer had a built in seat for her shower. Equipment updates discussed with evaluating PT. Pt had previously requested to be take off PT case load, however after talking she stated PT could check in on her again, when she had fewer lines. Will continue to follow acutely for mobility progression.     Follow Up Recommendations  No PT follow up     Equipment Recommendations  Rolling walker with 5" wheels(Shower chair (pt states 3in1 is too large for her tub))    Recommendations for Other Services       Precautions / Restrictions Precautions Precautions: Fall Precaution Comments: reports 3-4 falls since moving here from [place], [place] last month, reports falls asleep during prolonged toileting due to Crohn's  Restrictions Weight Bearing Restrictions: No    Mobility  Bed Mobility                  Transfers                    Ambulation/Gait                 Stairs             Wheelchair Mobility    Modified Rankin (Stroke Patients Only)       Balance                                            Cognition Arousal/Alertness: Awake/alert Behavior During Therapy: WFL for tasks assessed/performed Overall  Cognitive Status: Within Functional Limits for tasks assessed                                        Exercises      General Comments        Pertinent Vitals/Pain Pain Assessment: Faces Faces Pain Scale: No hurt    Home Living                      Prior Function            PT Goals (current goals can now be found in the care plan section) Acute Rehab PT Goals Patient Stated Goal: to return to independent PT Goal Formulation: With patient Time For Goal Achievement: 11/11/19 Potential to Achieve Goals: Good Progress towards PT goals: Progressing toward goals    Frequency    Min 3X/week      PT Plan Equipment recommendations need to be updated    Co-evaluation  AM-PAC PT "6 Clicks" Mobility   Outcome Measure  Help needed turning from your back to your side while in a flat bed without using bedrails?: None Help needed moving from lying on your back to sitting on the side of a flat bed without using bedrails?: None Help needed moving to and from a bed to a chair (including a wheelchair)?: None Help needed standing up from a chair using your arms (e.g., wheelchair or bedside chair)?: None Help needed to walk in hospital room?: None Help needed climbing 3-5 steps with a railing? : A Little 6 Click Score: 23    End of Session         PT Visit Diagnosis: Muscle weakness (generalized) (M62.81)     Time: 2111-5520 PT Time Calculation (min) (ACUTE ONLY): 18 min  Charges:  $Self Care/Home Management: 8-22                     Benjiman Core, PTA Pager [phone] Acute Rehab   Allena Katz 10/30/2019, 1:54 PM

## 2019-10-31 LAB — CBC
HCT: 26.4 % — ABNORMAL LOW (ref 36.0–46.0)
Hemoglobin: 8.8 g/dL — ABNORMAL LOW (ref 12.0–15.0)
MCH: 30.2 pg (ref 26.0–34.0)
MCHC: 33.3 g/dL (ref 30.0–36.0)
MCV: 90.7 fL (ref 80.0–100.0)
Platelets: 144 10*3/uL — ABNORMAL LOW (ref 150–400)
RBC: 2.91 MIL/uL — ABNORMAL LOW (ref 3.87–5.11)
RDW: 15.3 % (ref 11.5–15.5)
WBC: 5.9 10*3/uL (ref 4.0–10.5)
nRBC: 0 % (ref 0.0–0.2)

## 2019-10-31 LAB — BASIC METABOLIC PANEL
Anion gap: 8 (ref 5–15)
BUN: 29 mg/dL — ABNORMAL HIGH (ref 6–20)
CO2: 17 mmol/L — ABNORMAL LOW (ref 22–32)
Calcium: 8.9 mg/dL (ref 8.9–10.3)
Chloride: 114 mmol/L — ABNORMAL HIGH (ref 98–111)
Creatinine, Ser: 1.81 mg/dL — ABNORMAL HIGH (ref 0.44–1.00)
GFR calc Af Amer: 35 mL/min — ABNORMAL LOW (ref >60)
GFR calc non Af Amer: 30 mL/min — ABNORMAL LOW (ref >60)
Glucose, Bld: 104 mg/dL — ABNORMAL HIGH (ref 70–99)
Potassium: 4 mmol/L (ref 3.5–5.1)
Sodium: 139 mmol/L (ref 135–145)

## 2019-10-31 LAB — GLUCOSE, CAPILLARY
Glucose-Capillary: 93 mg/dL (ref 70–99)
Glucose-Capillary: 112 mg/dL — ABNORMAL HIGH (ref 70–99)

## 2019-10-31 LAB — PHOSPHORUS: Phosphorus: 3.3 mg/dL (ref 2.5–4.6)

## 2019-10-31 LAB — MAGNESIUM: Magnesium: 2.1 mg/dL (ref 1.7–2.4)

## 2019-10-31 MED ORDER — SUCRALFATE 1 G PO TABS: 1.0000 g | ORAL_TABLET | Freq: Three times a day (TID) | ORAL | Status: DC

## 2019-10-31 MED ORDER — TRAVASOL 10 % IV SOLN
INTRAVENOUS | Status: DC
  Filled 2019-10-31: qty 1044

## 2019-10-31 MED ORDER — DULOXETINE HCL 30 MG PO CPEP: 90.0000 mg | ORAL_CAPSULE | Freq: Every day | ORAL | 3 refills | Status: DC

## 2019-10-31 MED ORDER — PANCRELIPASE (LIP-PROT-AMYL) 36000-114000 UNITS PO CPEP: 36000.0000 [IU] | ORAL_CAPSULE | Freq: Three times a day (TID) | ORAL | Status: DC

## 2019-10-31 MED ORDER — DIPHENOXYLATE-ATROPINE 2.5-0.025 MG PO TABS: 1.0000 | ORAL_TABLET | Freq: Four times a day (QID) | ORAL | 0 refills | Status: DC | PRN

## 2019-10-31 MED ORDER — TRAZODONE HCL 100 MG PO TABS: 200.0000 mg | ORAL_TABLET | Freq: Every evening | ORAL | Status: DC | PRN

## 2019-10-31 MED ORDER — HEPARIN SOD (PORK) LOCK FLUSH 100 UNIT/ML IV SOLN
250.0000 [IU] | INTRAVENOUS | Status: AC | PRN
  Administered 2019-10-31: 250 [IU]
  Filled 2019-10-31: qty 2.5

## 2019-10-31 MED ORDER — BUDESONIDE 3 MG PO CPEP: 9.0000 mg | ORAL_CAPSULE | Freq: Every day | ORAL | Status: DC

## 2019-10-31 NOTE — Discharge Summary (Signed)
Physician Discharge Summary  [name]:[id number] DOB: 14-Oct-1961 DOA: 10/27/2019  PCP: [name], [name], MD  Admit date: 10/27/2019 Discharge date: 10/31/2019  Admitted From: Home Disposition:  Home   Recommendations for Outpatient Follow-up:  1. Follow up with PCP in 1 week 2. Follow up with GI, Dr. Tarri Glenn, as scheduled 12/30  3. Repeat BMP to check creatinine as an outpatient, creatinine improved throughout hospitalization 4. Follow-up on final blood culture results obtained on 12/3. Negative at day of discharge.   Discharge Condition: Stable CODE STATUS: Full  Diet recommendation: Regular diet + TPN   Brief/Interim Summary: Caitlin Marsan Harrisonis a 58 y.o.femalewith medical history significant ofhypertension, Crohn's disease s/pmultiple bowel resection surgeries with residual short gut syndrome,chronic anemia requiring blood transfusions,chronic pain syndrome on methadone, depression, and osteoporosis. Patient presentswith complaints of progressively worsening swelling of her face, arms, and legs. She moved from  [place], [place] at the beginning of November and has been staying with her daughter. Ultimately she had to move here because she was unable to care for herself at home. She reports that she had 7 previous bowel resections, and had been on TPN since 2003, and numerous line associated infections. However, patient reportedly self stopped TPN in October prior to her move here. She states that she was tired of the multiple line infections as the reason for stopping. Since stopping the TPN, she reports that she has been falling more frequently, had generalized weakness, and lost approximately 30 pounds. Reports having intermittent fevers that is more so chronic and diarrhea improved where she currently is only having 4bowel movements per day on average. She reports having a GI appointment with Dr. Tarri Glenn ([facility]) on 11/25/2019.   She was resumed on TPN  with improvement in her swelling, AKI.  Due to fever, blood cultures were obtained which were negative.  Antibiotics were stopped and patient remained afebrile.  Discharge Diagnoses:  Principal Problem:   Anasarca Active Problems:   Chronic diarrhea   Crohn's disease ([place])   Anemia   Hypokalemia due to excessive gastrointestinal loss of potassium   Crohn disease ([name])   Malnutrition ([name])   HTN (hypertension)   Chronic pain syndrome   Acute kidney injury superimposed on chronic kidney disease ([place])   Falls   Anasarcasuspected secondary to protein calorie malnutrition -Patient presented with swelling of the face and lower extremities. Labs reveal significant hypoalbuminemia.  At this time suspect patient is likely third spacing due to poor nutritional status. -Patient agreeable to resume TPN -Improving edema   Acute kidney injury superimposed on chronic kidney disease stage IIIb -At baseline patient's creatinine had been around 1.5 previously -Improving, Cr 1.8 on day of discharge   Fever -Initial concern for bacteremia, has had left femoral PICC chronically -UA negative  -Blood culture negative to date -Empiric antibiotics stopped, patient has remained afebrile  Crohn's disease, short gut syndrome, chronic diarrhea -Patient with history of 7 previous bowel resections due to Crohn's disease resulting in short syndrome.Currently only reports having 4 bowel movements per day. -Continue lomotil, imodium, bentyl, entocort  -Stable   Essential hypertension -Continue amlodipine, metoprolol  -BP stable   Falls -Patient reports having multiple falls at home. Suspect secondary to weakness due to poor nutrition, but secondary causes include polypharmacy with pain medications. -PT evaluated patient, no further PT follow-up recommended  Chronic normocytic anemia, of chronic disease -Baseline Hgb around 8  -Hgb fell to 6.9 12/4, no acute sign of bleeding, transfused 1u  pRBC -Hgb  stable this morning 8.8  Depression -Continue Wellbutrin, trazodone   Chronic pain syndrome -She was referred to pain clinic as outpatient -Continue home regimen, dilaudid, methadone, cymbalta   S/p total hysterectomy -Continue estrace   GERD -Continue PPI    Discharge Instructions  Discharge Instructions    Call MD for:  difficulty breathing, headache or visual disturbances   Complete by: As directed    Call MD for:  extreme fatigue   Complete by: As directed    Call MD for:  hives   Complete by: As directed    Call MD for:  persistant dizziness or light-headedness   Complete by: As directed    Call MD for:  persistant nausea and vomiting   Complete by: As directed    Call MD for:  severe uncontrolled pain   Complete by: As directed    Call MD for:  temperature >100.4   Complete by: As directed    Diet general   Complete by: As directed    Discharge instructions   Complete by: As directed    You were cared for by a hospitalist during your hospital stay. If you have any questions about your discharge medications or the care you received while you were in the hospital after you are discharged, you can call the unit and ask to speak with the hospitalist on call if the hospitalist that took care of you is not available. Once you are discharged, your primary care physician will handle any further medical issues. Please note that NO REFILLS for any discharge medications will be authorized once you are discharged, as it is imperative that you return to your primary care physician (or establish a relationship with a primary care physician if you do not have one) for your aftercare needs so that they can reassess your need for medications and monitor your lab values.   Increase activity slowly   Complete by: As directed      Allergies as of 10/31/2019      Reactions   Gabapentin Other (See Comments)   unknown   Lyrica [pregabalin] Other (See Comments)   unknown    Topamax [topiramate] Other (See Comments)   unknown   Fentanyl Rash   Morphine And Related Rash      Medication List    TAKE these medications   acetaminophen 325 MG tablet Commonly known as: TYLENOL Take 650 mg by mouth every 6 (six) hours as needed for mild pain.   amLODipine 5 MG tablet Commonly known as: NORVASC Take 1 tablet (5 mg total) by mouth daily.   budesonide 3 MG 24 hr capsule Commonly known as: ENTOCORT EC Take 3 capsules (9 mg total) by mouth daily. Start taking on: November 01, 2019   buPROPion 100 MG 12 hr tablet Commonly known as: WELLBUTRIN SR Take 100 mg by mouth 2 (two) times daily.   cycloSPORINE 0.05 % ophthalmic emulsion Commonly known as: RESTASIS Place 1 drop into both eyes 2 (two) times daily.   Dexilant 60 MG capsule Generic drug: dexlansoprazole Take 1 capsule (60 mg total) by mouth daily.   dicyclomine 10 MG capsule Commonly known as: BENTYL Take 10 mg by mouth 3 (three) times daily before meals.   diphenoxylate-atropine 2.5-0.025 MG tablet Commonly known as: LOMOTIL Take 1 tablet by mouth 4 (four) times daily as needed for diarrhea or loose stools.   DULoxetine 30 MG capsule Commonly known as: CYMBALTA Take 3 capsules (90 mg total) by mouth daily. Start taking  on: November 01, 2019   estradiol 2 MG tablet Commonly known as: ESTRACE Take 2 mg by mouth daily.   HYDROmorphone 4 MG tablet Commonly known as: DILAUDID Take 4 mg by mouth every 6 (six) hours as needed for moderate pain or severe pain.   lipase/protease/amylase 36000 UNITS Cpep capsule Commonly known as: CREON Take 1 capsule (36,000 Units total) by mouth 3 (three) times daily before meals.   loperamide 2 MG capsule Commonly known as: IMODIUM Take 1 capsule (2 mg total) by mouth as needed for diarrhea or loose stools.   methadone 10 MG tablet Commonly known as: DOLOPHINE Take 20 mg by mouth every 12 (twelve) hours.   metoprolol tartrate 100 MG tablet Commonly  known as: LOPRESSOR Take 100 mg by mouth 2 (two) times daily.   potassium chloride SA 20 MEQ tablet Commonly known as: KLOR-CON Take 2 tablets (40 mEq total) by mouth daily.   promethazine 25 MG tablet Commonly known as: PHENERGAN Take 1 tablet (25 mg total) by mouth every 6 (six) hours as needed for nausea or vomiting.   sucralfate 1 g tablet Commonly known as: CARAFATE Take 1 tablet (1 g total) by mouth 4 (four) times daily -  before meals and at bedtime.   traZODone 100 MG tablet Commonly known as: DESYREL Take 2 tablets (200 mg total) by mouth at bedtime as needed for sleep.            Durable Medical Equipment  (From admission, onward)         Start     Ordered   10/31/19 1121  For home use only DME Shower stool  Once     10/31/19 1120   10/31/19 1120  For home use only DME Walker rolling  Once    Question:  Patient needs a walker to treat with the following condition  Answer:  Fall   10/31/19 1120         Follow-up Information    [name], [name], MD. Schedule an appointment as soon as possible for a visit in 1 week(s).   Specialty: Internal Medicine Contact information: [place] [address] [phone]        Thornton Park, MD. Go on 11/25/2019.   Specialty: Gastroenterology Contact information: [place] [address] [phone]          Allergies  Allergen Reactions  . Gabapentin Other (See Comments)    unknown  . Lyrica [Pregabalin] Other (See Comments)    unknown  . Topamax [Topiramate] Other (See Comments)    unknown  . Fentanyl Rash  . Morphine And Related Rash    Consultations:  None    Procedures/Studies: Ct Abdomen Pelvis Wo Contrast  Result Date: 10/12/2019 CLINICAL DATA:  58 year old female with abdominal pain, fever and lower extremity swelling. Crohn disease EXAM: CT ABDOMEN AND PELVIS WITHOUT CONTRAST TECHNIQUE: Multidetector CT imaging of the abdomen and pelvis  was performed following the standard protocol without IV contrast. COMPARISON:  Chest radiographs earlier today. FINDINGS: Lower chest: Minor costophrenic angle atelectasis or scarring. Small surgical clips along the anterior right costophrenic angle. Cardiac size at the upper limits of normal. No pericardial or pleural effusion. Hepatobiliary: Gallbladder seems to be surgically absent with dilated hepatic ducts at the porta hepatis and CBD approximately 11-12 millimeters diameter. Mild additional intrahepatic biliary ductal dilatation suspected. No discrete liver lesion in the absence of contrast. Pancreas: Poorly delineated in the absence of contrast, probably atrophied.  Spleen: Negative noncontrast appearance. Adrenals/Urinary Tract: No adrenal gland enlargement identified. No hydronephrosis, perinephric stranding or nephrolithiasis. Mildly distended but otherwise unremarkable urinary bladder. Stomach/Bowel: Negative rectum. Sigmoid and descending colon are mildly distended with a mix of stool and fluid. Gas and fluid at the splenic flexure. Surgical clips in the left upper quadrant appear to be related to previous right hemicolectomy with a small bowel anastomosis to the distal transverse colon. But regional small bowel loops are poorly delineated in the absence of contrast. Numerous additional a bowel staples and surgical clips elsewhere in the mid and right abdomen where fluid-filled small bowel loops are at the upper limits of normal for size. The stomach seems to be decompressed. The duodenum is difficult to delineate. No free air.  No free fluid identified. Vascular/Lymphatic: Vascular patency is not evaluated in the absence of IV contrast. There is a catheter like device tunneled in the left proximal anterior thigh subcutaneous fat (series 3, image 85), which courses in the left iliac vein to the confluence with the IVC. Mild Calcified aortic atherosclerosis. No lymphadenopathy is evident. Reproductive:  The uterus appears to be surgically absent. Ovaries are diminutive or absent. Other: No pelvic free fluid. Musculoskeletal: Osteopenia. Mild lumbar scoliosis. Advanced lower lumbar facet degeneration. No acute osseous abnormality identified. IMPRESSION: 1. Extensive postoperative changes to bowel, with evidence of prior hemicolectomy and left upper quadrant small to large bowel anastomosis. No bowel inflammation is evident in the absence of contrast. 2. Gallbladder seems to be surgically absent with intra-and extrahepatic biliary ductal enlargement. Correlate for hyperbilirubinemia. 3. No urinary calculus or obstructive uropathy. 4. Tunneled left femoral vein approach central venous catheter is in place. Electronically Signed   By: Genevie Ann M.D.   On: 10/12/2019 22:34   Dg Chest 2 View  Result Date: 10/12/2019 CLINICAL DATA:  Leg swelling EXAM: CHEST - 2 VIEW COMPARISON:  None. FINDINGS: Heart and mediastinal contours are within normal limits. No focal opacities or effusions. No acute bony abnormality. IMPRESSION: No active cardiopulmonary disease. Electronically Signed   By: Rolm Baptise M.D.   On: 10/12/2019 21:17   Dg Chest Port 1 View  Result Date: 10/28/2019 CLINICAL DATA:  Edema. New onset facial swelling. Patient complains of leg swelling for the last week. EXAM: PORTABLE CHEST 1 VIEW COMPARISON:  Two-view chest x-ray 10/12/2019 FINDINGS: The heart size and mediastinal contours are within normal limits. Both lungs are clear. The visualized skeletal structures are unremarkable. Right perihilar clips are again noted. IMPRESSION: Negative one-view chest x-ray Electronically Signed   By: San Morelle M.D.   On: 10/28/2019 05:00       Discharge Exam: Vitals:   10/30/19 2151 10/30/19 2158  BP: (!) 136/59 138/62  Pulse: (!) 59 65  Resp: 18   Temp: 98.6 F (37 C)   SpO2: 100%     General: Pt is alert, awake, not in acute distress Cardiovascular: RRR, S1/S2 +, no lower extremity  edema Respiratory: CTA bilaterally, no wheezing, no rhonchi, no respiratory distress, no conversational dyspnea  Abdominal: Soft, NT, ND, bowel sounds + Extremities: no edema, no cyanosis Psych: Normal mood and affect, stable judgement and insight     The results of significant diagnostics from this hospitalization (including imaging, microbiology, ancillary and laboratory) are listed below for reference.     Microbiology: Recent Results (from the past 240 hour(s))  SARS CORONAVIRUS 2 (TAT 6-24 HRS) Nasopharyngeal Nasopharyngeal Swab     Status: None   Collection Time: 10/28/19  4:50  AM   Specimen: Nasopharyngeal Swab  Result Value Ref Range Status   SARS Coronavirus 2 NEGATIVE NEGATIVE Final    Comment: (NOTE) SARS-CoV-2 target nucleic acids are NOT DETECTED. The SARS-CoV-2 RNA is generally detectable in upper and lower respiratory specimens during the acute phase of infection. Negative results do not preclude SARS-CoV-2 infection, do not rule out co-infections with other pathogens, and should not be used as the sole basis for treatment or other patient management decisions. Negative results must be combined with clinical observations, patient history, and epidemiological information. The expected result is Negative. Fact Sheet for Patients: SugarRoll.be Fact Sheet for Healthcare Providers: https://www.woods-mathews.com/ This test is not yet approved or cleared by the [place] FDA and  has been authorized for detection and/or diagnosis of SARS-CoV-2 by FDA under an Emergency Use Authorization (EUA). This EUA will remain  in effect (meaning this test can be used) for the duration of the COVID-19 declaration under Section 56 4(b)(1) of the Act, 21 U.S.C. section 360bbb-3(b)(1), unless the authorization is terminated or revoked sooner. Performed at [facility], [place] [address], [place], [place] [address]   Culture, blood  (routine x 2)     Status: None (Preliminary result)   Collection Time: 10/29/19 12:17 AM   Specimen: BLOOD LEFT FOREARM  Result Value Ref Range Status   Specimen Description BLOOD LEFT FOREARM  Final   Special Requests   Final    BOTTLES DRAWN AEROBIC ONLY Blood Culture results may not be optimal due to an inadequate volume of blood received in culture bottles   Culture   Final    NO GROWTH 1 DAY Performed at [facility], [place] [address] [place], [place] [address]    Report Status PENDING  Incomplete  Culture, blood (routine x 2)     Status: None (Preliminary result)   Collection Time: 10/29/19 12:23 AM   Specimen: BLOOD LEFT HAND  Result Value Ref Range Status   Specimen Description BLOOD LEFT HAND  Final   Special Requests   Final    BOTTLES DRAWN AEROBIC AND ANAEROBIC Blood Culture adequate volume   Culture   Final    NO GROWTH 1 DAY Performed at [facility], [place] [address] [place] [employer], [place] [address]    Report Status PENDING  Incomplete     Labs: BNP (last 3 results) Recent Labs    10/12/19 2059 10/28/19 0435  BNP 253.9* 269.4*   Basic Metabolic Panel: Recent Labs  Lab 10/28/19 0427 10/28/19 0922 10/28/19 2222 10/29/19 0737 10/30/19 0157 10/31/19 0842  NA 141  --   --  139 136 139  K 3.1*  --   --  3.1* 3.8 4.0  CL 113*  --   --  113* 113* 114*  CO2 16*  --   --  17* 16* 17*  GLUCOSE 109*  --   --  115* 111* 104*  BUN 31*  --   --  28* 29* 29*  CREATININE 2.10*  --   --  1.95* 1.94* 1.81*  CALCIUM 8.3*  --   --  7.8* 8.3* 8.9  MG  --  0.8* 1.7 1.6* 2.1 2.1  PHOS  --  3.9 1.6* 4.1 2.8 3.3   Liver Function Tests: Recent Labs  Lab 10/28/19 0427 10/29/19 0737  AST 28 16  ALT 104* 60*  ALKPHOS 180* 148*  BILITOT 0.4 0.6  PROT 6.1* 5.2*  ALBUMIN 2.8* 2.2*   No results for  input(s): LIPASE, AMYLASE in the last 168 hours. No results for input(s): AMMONIA in the last 168 hours. CBC: Recent Labs  Lab 10/28/19 0427 10/29/19 0737  10/30/19 0157 10/30/19 2015 10/31/19 0842  WBC 4.1 13.0* 11.2*  --  5.9  NEUTROABS 2.2 11.6*  --   --   --   HGB 7.9* 7.3* 6.9* 8.4* 8.8*  HCT 24.9* 22.0* 21.1* 26.3* 26.4*  MCV 95.4 92.8 92.5  --  90.7  PLT 232 136* 125*  --  144*   Cardiac Enzymes: No results for input(s): CKTOTAL, CKMB, CKMBINDEX, TROPONINI in the last 168 hours. BNP: Invalid input(s): POCBNP CBG: Recent Labs  Lab 10/30/19 0516 10/30/19 1142 10/30/19 1728 10/30/19 2331 10/31/19 0506  GLUCAP 120* 131* 116* 128* 93   D-Dimer No results for input(s): DDIMER in the last 72 hours. Hgb A1c Recent Labs    10/28/19 2223  HGBA1C 4.7*   Lipid Profile Recent Labs    10/29/19 0739  TRIG 76   Thyroid function studies No results for input(s): TSH, T4TOTAL, T3FREE, THYROIDAB in the last 72 hours.  Invalid input(s): FREET3 Anemia work up Recent Labs    10/29/19 0737  FERRITIN 223  TIBC 123*  IRON 11*   Urinalysis    Component Value Date/Time   COLORURINE YELLOW 10/28/2019 0524   APPEARANCEUR HAZY (A) 10/28/2019 0524   LABSPEC 1.017 10/28/2019 0524   PHURINE 5.0 10/28/2019 0524   GLUCOSEU NEGATIVE 10/28/2019 0524   HGBUR NEGATIVE 10/28/2019 0524   [place] NEGATIVE 10/28/2019 0524   KETONESUR NEGATIVE 10/28/2019 0524   PROTEINUR 30 (A) 10/28/2019 0524   NITRITE NEGATIVE 10/28/2019 0524   LEUKOCYTESUR TRACE (A) 10/28/2019 0524   Sepsis Labs Invalid input(s): PROCALCITONIN,  WBC,  LACTICIDVEN Microbiology Recent Results (from the past 240 hour(s))  SARS CORONAVIRUS 2 (TAT 6-24 HRS) Nasopharyngeal Nasopharyngeal Swab     Status: None   Collection Time: 10/28/19  4:50 AM   Specimen: Nasopharyngeal Swab  Result Value Ref Range Status   SARS Coronavirus 2 NEGATIVE NEGATIVE Final    Comment: (NOTE) SARS-CoV-2 target nucleic acids are NOT DETECTED. The SARS-CoV-2 RNA is generally detectable in upper and lower respiratory specimens during the acute phase of infection. Negative results do not  preclude SARS-CoV-2 infection, do not rule out co-infections with other pathogens, and should not be used as the sole basis for treatment or other patient management decisions. Negative results must be combined with clinical observations, patient history, and epidemiological information. The expected result is Negative. Fact Sheet for Patients: SugarRoll.be Fact Sheet for Healthcare Providers: https://www.woods-mathews.com/ This test is not yet approved or cleared by the [place] FDA and  has been authorized for detection and/or diagnosis of SARS-CoV-2 by FDA under an Emergency Use Authorization (EUA). This EUA will remain  in effect (meaning this test can be used) for the duration of the COVID-19 declaration under Section 56 4(b)(1) of the Act, 21 U.S.C. section 360bbb-3(b)(1), unless the authorization is terminated or revoked sooner. Performed at [facility], [place] [address] [place], [place] [address]   Culture, blood (routine x 2)     Status: None (Preliminary result)   Collection Time: 10/29/19 12:17 AM   Specimen: BLOOD LEFT FOREARM  Result Value Ref Range Status   Specimen Description BLOOD LEFT FOREARM  Final   Special Requests   Final    BOTTLES DRAWN AEROBIC ONLY Blood Culture results may not be optimal due to an inadequate volume of blood received in culture  bottles   Culture   Final    NO GROWTH 1 DAY Performed at [facility], [place] [address], [place], [place] [address]    Report Status PENDING  Incomplete  Culture, blood (routine x 2)     Status: None (Preliminary result)   Collection Time: 10/29/19 12:23 AM   Specimen: BLOOD LEFT HAND  Result Value Ref Range Status   Specimen Description BLOOD LEFT HAND  Final   Special Requests   Final    BOTTLES DRAWN AEROBIC AND ANAEROBIC Blood Culture adequate volume   Culture   Final    NO GROWTH 1 DAY Performed at [facility], [place] [address]  [place], [place] [address]    Report Status PENDING  Incomplete     Patient was seen and examined on the day of discharge and was found to be in stable condition. Time coordinating discharge: 35 minutes including assessment and coordination of care, as well as examination of the patient.   SIGNED:  Dessa Phi, DO [facility] 10/31/2019, 11:23 AM

## 2019-10-31 NOTE — Progress Notes (Signed)
Patient discharged to home with instructions, CVL flushed by IV team for home use as ordered.

## 2019-10-31 NOTE — Progress Notes (Signed)
PHARMACY - TOTAL PARENTERAL NUTRITION CONSULT NOTE  Indication: Short gut syndrome  Patient Measurements: Height: 5' 8"  (172.7 cm) Weight: 134 lb 6.4 oz (61 kg) IBW/kg (Calculated) : 63.9 TPN AdjBW (KG): 61 Body mass index is 20.44 kg/m. Usual Weight: 150-155 lbs (68-70 kg)  Assessment:  58 YOF presented 10/27/19 with bilateral leg and eye swelling.  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  Patient took herself off of TPN to see if she could sustain without it.  She is tired of frequent line infections and hospitalizations.  MD suspects anasarca is secondary to protein calorie malnutrition and pharmacy has been consulted to restart TPN.  Patient reports that she receives TPN only 2 times a week and on days that she does not get TPN she receives 1L of [facility] pharmacist reports D51/2NS and TPN 3 times per week).  She also endorses a 30-lb weight loss in 30-45 days since she was off TPN.  Patient is at risk for refeeding syndrome.  Glucose / Insulin: no hx DM, A1c 4.7% - CBGs controlled, minimal SSI use Electrolytes: high CL and low CO2, others within normal limits Renal: AKI - SCr down 1.81, BUN down 29 LFTs / TGs: alk phos/ALT improved to 148/60, AST / tbili / TG WNL Prealbumin / albumin: Baseline prealbumin slightly low at 12.6, albumin 2.2 Intake / Output; MIVF: bowel movement x2 last on 12/4, Unmeasured urine occurrences x4 GI Imaging: N/A Surgeries / Procedures: N/A  Central access: PICC placed 10/12/19 (recent line fxn and line replaced) TPN start date: 10/28/19  Nutritional Goals (RD recommendations on 12/4): 1850-2050 kCal, 95-110gm protein per day Goal TPN rate is 75 mL/hr (provides 104 g of protein and 1863 kcals per day)   Current Nutrition:  Heart diet -100% intake TPN  [facility]:  Fabio Bering [phone] Formula in Sept 2020:  TPN 3 times per week, 1569m over 12 hrs, no lipid = 428 kCal,  80gm AA.  D51/2NS 1L on days not on TPN. [place]  Plan:  Continue TPN at goal rate of 75 ml/hr at 1800. Plan to start cycling in AM if continues to tolerate.  TPN will provide 104g AA, 261g CHO and 55.8g ILE for a total of 1863 kCal, meeting ~100% of patient needs Electrolytes in TPN: continue current lytes, max acetate Add standard MVI and trace elements to TPN Continue sensitive SSI Q6H and adjust as needed Monitor K on scheduled KCL 420m daily and K in TPN F/U TPN labs, cycle TPN once stable on goal rate  JeSloan LeiterPharmD, BCPS, BCCCP Clinical Pharmacist Please refer to [facility]or MCRidgewayumbers 10/31/2019, 8:41 AM

## 2019-10-31 NOTE — TOC Transition Note (Signed)
Transition of Care [facility]) - CM/SW Discharge Note   Patient Details  Name: LACHANDA BUCZEK MRN: [id number] Date of Birth: 05/22/1961  [facility]) CM/SW Contact:  Claudie Leach, RN Phone Number: [phone] 10/31/2019, 1:07 PM   Clinical Narrative:    Patient to dc home with [place] TPN.  Original plan was for patient to d/c Monday with custom TPN infusions.  Per Dr. Maylene Roes, patient may d/c today with plan for [facility] infusion tomorrow with [facility] visit from [facility]. Patient receiving TPN in hospital today.  Patient desires to dc today. Discussed at length with Pam, [facility] infusion nurse and Cassie, RN liason for [facility] to coordinate care.  [facility] pharmacist will dose TPN.     RW ordered from [employer].  RW will be delivered to room around 2:30.    Patient verbalizes understanding that RN should be present when she starts infusions since this is new pump, etc.  Also she may incur a $90/day charge if  [facility] Medicaid application is rejected- patient is in application process.      Final next level of care: [place] Barriers to Discharge: No Barriers Identified   Patient Goals and CMS Choice Patient states their goals for this hospitalization and ongoing recovery are:: to go home CMS Medicare.gov Compare Post Acute Care list provided to:: Patient Choice offered to / list presented to : Patient   Discharge Plan and Services   Discharge Planning Services: CM Consult Post Acute Care Choice: Home Health          DME Arranged: IV pump/equipment DME Agency: ([facility]) Date DME Agency Contacted: 10/31/19 Time DME Agency Contacted: [phone] Representative spoke with at DME Agency: Pam     Date [place]: 10/31/19 Time [place]: 1307 Representative spoke with at [place]: [place] Interventions     Readmission Risk Interventions Readmission Risk Prevention Plan 10/30/2019  Transportation  Screening Complete  PCP or Specialist Appt within 3-5 Days Not Complete  Not Complete comments pending d/c date to arrange  [place] or [place] Complete  Social Work Consult for [place] Planning/Counseling Not Complete  SW consult not completed comments RNCM met with pt  Palliative Care Screening Not Applicable  Medication Review Press photographer) Complete

## 2019-11-02 ENCOUNTER — Telehealth: Payer: Self-pay

## 2019-11-02 ENCOUNTER — Encounter: Payer: Self-pay | Admitting: Internal Medicine

## 2019-11-02 LAB — TYPE AND SCREEN
ABO/RH(D): O POS
Antibody Screen: NEGATIVE
Unit division: 0
Unit division: 0
Unit division: 0

## 2019-11-02 LAB — BPAM RBC
Blood Product Expiration Date: 202012312359
Blood Product Expiration Date: 202012312359
Blood Product Expiration Date: 202101092359
ISSUE DATE / TIME: 202012040933
ISSUE DATE / TIME: 202012051416
Unit Type and Rh: 5100
Unit Type and Rh: 5100
Unit Type and Rh: 5100

## 2019-11-02 NOTE — Telephone Encounter (Signed)
Transition Care Management Follow-up Telephone Call  Date of discharge and from where: 10/31/2019 from [facility]  How have you been since you were released from the hospital? "She is doing pretty good".   Any questions or concerns? Yes   "Her current order for TPN is for 7 days. Will she be able to go back down to 3 days"?  Items Reviewed:  Did the pt receive and understand the discharge instructions provided? Yes   Medications obtained and verified? Yes   Any new allergies since your discharge? Yes   Dietary orders reviewed? Yes  Do you have support at home? Yes   Other (ie: DME, Home Health, etc) yes for TPN     Functional Questionnaire: (I = Independent and D = Dependent) ADL's: "I help her when she needs something"  Bathing/Dressing-    Meal Prep-   Eating-   Maintaining continence-   Transferring/Ambulation-   Managing Meds-    Follow up appointments reviewed:    PCP Hospital f/u appt confirmed? Yes    Specialist Hospital f/u appt confirmed? Yes  Scheduled to see gastroenterology.  Are transportation arrangements needed? No   If their condition worsens, is the pt aware to call  their PCP or go to the ED? Yes  Was the patient provided with contact information for the PCP's office or ED? Yes  Was the pt encouraged to call back with questions or concerns? Yes

## 2019-11-03 ENCOUNTER — Encounter: Attending: Colon & Rectal Surgery | Primary: Family Medicine

## 2019-11-03 ENCOUNTER — Telehealth: Payer: Self-pay | Admitting: Internal Medicine

## 2019-11-03 LAB — CULTURE, BLOOD (ROUTINE X 2)
Culture: NO GROWTH
Culture: NO GROWTH
Special Requests: ADEQUATE

## 2019-11-03 NOTE — Telephone Encounter (Signed)
Unclear as to what the question here is??

## 2019-11-03 NOTE — Telephone Encounter (Signed)
Elizabeth from [facility] called in regarding patient medication problem . Generic Tpn . Please advise    The bag was not mixed patient only received small amount

## 2019-11-05 NOTE — Telephone Encounter (Signed)
ok 

## 2019-11-05 NOTE — Telephone Encounter (Signed)
Noted  

## 2019-11-05 NOTE — Telephone Encounter (Signed)
I called Caitlin Peters and she stated this should have been an FYI as this was her fault in which an error was made.  She stated she did not realize the Rx was Clindamx-a generic TPN 2 and she thought it was hydration.  Stated she called the pharmacy, 200cc of lytes went in, labs were drawn, she checked the pt and she is OK.  Caitlin Peters stated she started the regular costumized medication for the pt.  Message sent to Dr Jerilee Hoh.

## 2019-11-06 NOTE — Telephone Encounter (Signed)
Left message for patient to call back. CRM created.

## 2019-11-09 ENCOUNTER — Ambulatory Visit: Payer: Medicare Other | Admitting: Skilled Nursing Facility1

## 2019-11-10 NOTE — Telephone Encounter (Signed)
Left message on machine returning Mo's call.

## 2019-11-11 ENCOUNTER — Telehealth: Payer: Self-pay | Admitting: *Deleted

## 2019-11-11 NOTE — Telephone Encounter (Signed)
Copied from [place] [id number]. Topic: General - Inquiry >> Oct 27, 2019  3:07 PM Alease Frame wrote: Reason for CRM: Patient daughter called in needing a call back from Dr Jerilee Hoh nurse . Pease advise

## 2019-11-12 ENCOUNTER — Telehealth: Payer: Self-pay | Admitting: Internal Medicine

## 2019-11-12 NOTE — Telephone Encounter (Signed)
[name] calling with [facility] called and stated that pt got up in the middle of the night and feel asleep on the toilet and then fell and hit her head. Pt does have a bruised left eye. [name] states that she has a small knot on left side of head but she seems fine. FYI

## 2019-11-12 NOTE — Telephone Encounter (Signed)
Chantel with [facility] called in to follow up on VO request faxed  For re-certification for home health for date 10/18/19    [phone] Fax  [phone] phone

## 2019-11-12 NOTE — Telephone Encounter (Signed)
Message Routed to PCP CMA 

## 2019-11-12 NOTE — Telephone Encounter (Signed)
She is not on blood thinners so probably ok to observe for now. If HA, confusion, blurry vision, needs ED for head imaging.  [place]

## 2019-11-12 NOTE — Telephone Encounter (Signed)
[name] is aware

## 2019-11-12 NOTE — Telephone Encounter (Signed)
Spoke with [name] (nurse) and nothing further is needed.

## 2019-11-18 ENCOUNTER — Other Ambulatory Visit: Payer: Self-pay | Admitting: Internal Medicine

## 2019-11-18 ENCOUNTER — Encounter: Payer: Self-pay | Admitting: Internal Medicine

## 2019-11-18 ENCOUNTER — Telehealth: Payer: Self-pay | Admitting: *Deleted

## 2019-11-18 DIAGNOSIS — R600 Localized edema: Secondary | ICD-10-CM

## 2019-11-18 DIAGNOSIS — G894 Chronic pain syndrome: Secondary | ICD-10-CM

## 2019-11-18 DIAGNOSIS — K50918 Crohn's disease, unspecified, with other complication: Secondary | ICD-10-CM

## 2019-11-18 NOTE — Telephone Encounter (Signed)
Okay to refer? 

## 2019-11-18 NOTE — Telephone Encounter (Signed)
Copied from [place] [id number]. Topic: General - Other >> Nov 18, 2019 11:36 AM Leward Quan A wrote: Reason for CRM: Patient called to inquire of Dr Jerilee Hoh about the referral to pain management for her per patient she is almost out of pain medication and will need some more soon. She also wanted to inform that her leg swelling is going away and that upon going to the bathroom for a BM there is a lot of pain from her back to her belly then it goes away after once she have had a movement.

## 2019-11-18 NOTE — Telephone Encounter (Signed)
Message routed to PCP CMA  

## 2019-11-18 NOTE — Telephone Encounter (Signed)
Please review for refill promethazine  25 mg tab, which med can not be delegated And for estrace 2 mg tab, med was prescribed by historical provider and need discrete dispense values to fill. Dr. Jerilee Hoh

## 2019-11-18 NOTE — Telephone Encounter (Signed)
Copied from [place] [id number]. Topic: Quick Communication - Rx Refill/Question >> Nov 18, 2019 11:32 AM Leward Quan A wrote: Medication: promethazine (PHENERGAN) 25 MG tablet, estradiol (ESTRACE) 2 MG tablet   Has the patient contacted their pharmacy? Yes.   (Agent: If no, request that the patient contact the pharmacy for the refill.) (Agent: If yes, when and what did the pharmacy advise?)  Preferred Pharmacy (with phone number or street name): [facility], [place].  Phone:  [phone] Fax:  [phone]     Agent: Please be advised that RX refills may take up to 3 business days. We ask that you follow-up with your pharmacy.

## 2019-11-19 MED ORDER — PROMETHAZINE HCL 25 MG PO TABS: 25.0000 mg | ORAL_TABLET | Freq: Four times a day (QID) | ORAL | 0 refills | Status: DC | PRN

## 2019-11-19 NOTE — Telephone Encounter (Signed)
Orders faxed

## 2019-11-19 NOTE — Telephone Encounter (Signed)
Referral placed.

## 2019-11-19 NOTE — Telephone Encounter (Signed)
Pretty sure a referral was placed when she came in the office. OK to refer again.

## 2019-11-19 NOTE — Addendum Note (Signed)
Addended by: Westley Hummer B on: 11/19/2019 07:29 AM   Modules accepted: Orders

## 2019-11-23 ENCOUNTER — Telehealth: Payer: Self-pay

## 2019-11-23 DIAGNOSIS — K912 Postsurgical malabsorption, not elsewhere classified: Secondary | ICD-10-CM

## 2019-11-23 DIAGNOSIS — K50918 Crohn's disease, unspecified, with other complication: Secondary | ICD-10-CM

## 2019-11-23 DIAGNOSIS — G894 Chronic pain syndrome: Secondary | ICD-10-CM

## 2019-11-23 NOTE — Telephone Encounter (Signed)
Copied from [place] [id number]. Topic: General - Other >> Nov 23, 2019 10:22 AM Greggory Keen D wrote: Reason for CRM: pt called saying she needs refills on the Dilaudid 4 mg and Methadone 10 mg.  These were medications she use to get from her other doctor in [place].  She is also asking a referral to pain mgmt  CB@  [phone]  [address]

## 2019-11-24 NOTE — Telephone Encounter (Signed)
Pt called and stated the place that she was referred to does not help with her pain medication problem. Please see referral for 10/16/19 (pain Management). This was denied because they do not take issue.  Pt states that she does not have an appointment yet. Pt is very upset on the phone because no one has reached out to. Pt states that is having withdrawals because she has not had medication and would like a call as soon as possible. Please advise

## 2019-11-24 NOTE — Telephone Encounter (Signed)
Ok to pend meds for 1 month. She needs a pain management appointment. I have answered this in a separate note.  Caitlin Peters

## 2019-11-24 NOTE — Telephone Encounter (Signed)
Referrals have been placed for [facility] and [facility].

## 2019-11-24 NOTE — Addendum Note (Signed)
Addended by: Westley Hummer B on: 11/24/2019 01:18 PM   Modules accepted: Orders

## 2019-11-24 NOTE — Telephone Encounter (Signed)
Does she have an appointment with pain management already? If so ok to pend meds for 1 month.  [place]

## 2019-11-24 NOTE — Telephone Encounter (Signed)
General 11/19/2019 10:16 AM Rudean Curt, Caitlin Peters - -  Note   Sent to work que - once review pt will get a call to schedule 3-4 weeks for review  [facility] Medical clinic in [place], [place] Address: [address], [place], [place] [address] Phone: [phone]

## 2019-11-24 NOTE — Progress Notes (Addendum)
Referring Provider: [name], [facility]* Primary Care Physician:  [name], [name], MD  Reason for Consultation:  Diarrhea. 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 Marcy Siren at the [facility] Recent diarrhea, worsened off chronic opiates Uncontrolled chronic pain Poor p.o. intake due to pain with concerns for dehydration  Multifactorial iron deficiency anemia    -Crohn's disease, short bowel syndrome with malabsorption, and recurrent line sepsis  Review of records suggest ongoing symptoms over the last year.  Most recent evaluation for active Crohn's disease was endoscopically and histologically normal.  Recent cross-sectional abdominal imaging with contrast in October and without contrast in November has shown no bowel abnormalities.   She is having challenges managing her short gut syndrome since relocating to [place].  Sounds like she managed many of her symptoms with narcotics and it has been difficult for her to find a prescriber in this area.  With concerns for dehydration, she asked about going to the ED for evaluation. Discussing with her daughter, this seem appropriate given her concerns. It also allows for screening for electrolyte abnormalities.  Given her complicated history, challenges managing short gut syndrome requiring tertiary level care in [place], need for long-term TPN, and chronic narcotics that she attributes to Crohn's and SGS pain, I have recommended referral to a tertiary care center to establish care with an IBD Clinic and/or Gut Rehabilitation clinic.    PLAN: - I recommended that she go to the emergency room today to evaluate for dehydration and obtain IV fluids - Referral to [facility] to establish care with Intestinal Transplant program as this is the closest  tertiary care center to her daughter - Consider labs (ESR, CRP, fecal calprotectin) and contrasted cross-sectional imaging to restage her Crohn's if she is unable to be seen at a tertiary care center in a timely fashion  Please see the "Patient Instructions" section for addition details about the plan.  HPI: Caitlin Peters is a 58 y.o. female referred by Dr. [name] for a history of Crohn's disease. The history is obtained through the patient, her daughter who is a Marine scientist and accompanies her to this appointment, her electronic health record and records through [facility]. [facility] clinic since 2012 for management of malabsorption.  Her daughter is with her today. She has moved from [place] to be with her daughter due to her chronic illnesses.  She has a complicated past medical history.  She has Crohn's Disease requiring 7 prior surgeries and multiple complications culminating in short gut syndrome requiring TPN since 2003. She has developed chronic diarrhea, malabsorption, iron deficiency anemia requiring IV iron, and severe protein-caloric malnutrition. She has required multiple hospital admissions for sepsis and line infections. Just prior to her moving to [place], she had a left femoral tunneled PICC placed. She has frequent electrolyte deficiencies including hypomagnesemia and hypokalemia related to her diarrhea.   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  She had a possible mobile echodensity within a small pericardial recess that was resected 06/13/2015 with a right thoracotomy.   She had a partial  hysterectomy with left salpingo-oophorectomy in 1984.  She had a right salpingo-oophorectomy in 1990.  She had a cholecystectomy in 2018.  She also has chronic  pain related to Crohn's and was being managed by a pain clinic on methadone and hydromorphone.  She has had difficulty finding a pain clinic in the [place] area.  Crohn's diagnosed at age 28. She notes that it's been hard to take care of it due to multiple complications over the years.   Followed by the small bowel transplant clinic at the [facility] prior to moving to [place].  She underwent a full evaluation for intestinal transplant however she was not ultimately listed due to compliance and inability to identify a caregiver.  Her daughter is a Marine scientist in [place] but is very busy both personally and professionally with young children.  IBD history:  Surgical history: Colon resection with abdominal stoma May 2000, repair of intestinal leak June 2000, reversal of abdominal stoma March 2001, small bowel resection 2002, small bowel resection 2003, small bowel resection for obstruction 2009, ex lap/lysis of adhesions/tap procedure/open cholecystectomy/omentectomy 01/22/2018. Short gut syndrome since 2003 when she started TPN.  Surgical records from the [facility] clinic note approximately 120 cm of small bowel attached to her proximal transverse colon through the rectum.  She has had PICC line catheters off and on since 2000.  Ileocolonic anastomosis stricture has been dilated in the past. Current medications: Budesonide 9 mg daily, dicyclomine 20 mg 3 times daily, Imodium as needed, Creon Prior medications:  "I can't remember all of that stuff. They have given me so many drugs." Pentasa, Asacol, long-term steroids, probiotics, Lomotil, Benefiber, Flagyl, ciprofloxacin, cholestyramine. Not previously on biologics.  Last colonoscopy: 06/09/2018 at the [facility] with Dr. Marcy Siren.  Patent side-to-side ileocolonic anastomosis.  Healthy-appearing mucosa.  Small bowel mucosa with no diagnostic alteration.  Colonic mucosa with no diagnostic alteration.  No evidence of granulomas or  dysplasia. Fecal calprotectin: 52.7 09/16/18 Last EGD: 05/20/2017 with Dr. Salomon Mast was normal.  Biopsies showed no evidence of Crohn's disease. Most recent abdominal imaging:  - CT abd/pelvis with IV contrast 09/09/19 in [place] showed no acute process, possible constipation, numerous surgical clips from prior bowel surgeries, mild biliary dilatation related to prior cholecystectomy - CT abd/pelvis without contrast 10/12/19: Extensive postoperative changes with no bowel inflammation evident: Surgically absent gallbladder with intra and extra biliary ductal enlargement Extraintestinal manifestations: Right knee, iritis of the right eye, uveitis Family history of IBD: Paternal first cousin with Crohn's x 2 Comorbidities: History of multiple line infections, frequent readmission for dehydration and electrolyte abnormalities, narcotic dependency, home TPN since 2003, osteoporosis, stenosed upper veins, and history of noncompliance to PN infusion per pump report in 2017, C. Difficile   Has not eaten for 4 days. Poor appetite. Some nausea and vomiting. She thinks she is dehydrated. She thinks she needs her pain medications and has been unable to find a pain clinic in our region.  Has ongoing diarrhea preceded by cramping. Previously having 10-15 BM daily. On narcotics the frequency is 3-4 BM.  She has had no bowel movement today but attributes this to not eating.  Having trouble getting narcotics filled since moving to [place]. Lower abdominal cramping that precedes defecation and can be so severe that she tries to minimize bowel movements.    Chronic, diffuse, non-radiating abdominal pain is worse without her pain medications. No other identified relieving features.    Past Medical History:  Diagnosis Date  . Anasarca 10/2019  .  Chronic pain syndrome   . CKD (chronic kidney disease), stage III   . Crohn disease ([place])   . Crohn disease ([place])   . Depression   . Diverticulosis   . GERD  (gastroesophageal reflux disease)   . HTN (hypertension)   . Malnutrition ([place])   . Osteoporosis   . Pancreatitis   . Short gut syndrome   . Vitamin B12 deficiency     Past Surgical History:  Procedure Laterality Date  . ABDOMINAL HYSTERECTOMY    . ABDOMINAL SURGERY    . CHOLECYSTECTOMY    . COLONOSCOPY  2019  . UPPER GASTROINTESTINAL ENDOSCOPY      Current Outpatient Medications  Medication Sig Dispense Refill  . acetaminophen (TYLENOL) 325 MG tablet Take 650 mg by mouth every 6 (six) hours as needed for mild pain.     [name] Kitchen amLODipine (NORVASC) 5 MG tablet Take 1 tablet (5 mg total) by mouth daily. 90 tablet 1  . budesonide (ENTOCORT EC) 3 MG 24 hr capsule Take 3 capsules (9 mg total) by mouth daily.    [name] Kitchen buPROPion (WELLBUTRIN SR) 100 MG 12 hr tablet Take 100 mg by mouth 2 (two) times daily.    . cycloSPORINE (RESTASIS) 0.05 % ophthalmic emulsion Place 1 drop into both eyes 2 (two) times daily.    [name] Kitchen dexlansoprazole (DEXILANT) 60 MG capsule Take 1 capsule (60 mg total) by mouth daily. 90 capsule 1  . dicyclomine (BENTYL) 10 MG capsule Take 10 mg by mouth 3 (three) times daily before meals.    . diphenoxylate-atropine (LOMOTIL) 2.5-0.025 MG tablet Take 1 tablet by mouth 4 (four) times daily as needed for diarrhea or loose stools. 30 tablet 0  . DULoxetine (CYMBALTA) 30 MG capsule Take 3 capsules (90 mg total) by mouth daily.  3  . estradiol (ESTRACE) 2 MG tablet Take 2 mg by mouth daily.    [name] Kitchen HYDROmorphone (DILAUDID) 4 MG tablet Take 4 mg by mouth every 6 (six) hours as needed for moderate pain or severe pain.    [name] Kitchen lipase/protease/amylase (CREON) 36000 UNITS CPEP capsule Take 1 capsule (36,000 Units total) by mouth 3 (three) times daily before meals. 270 capsule   . loperamide (IMODIUM) 2 MG capsule Take 1 capsule (2 mg total) by mouth as needed for diarrhea or loose stools. 90 capsule 0  . methadone (DOLOPHINE) 10 MG tablet Take 20 mg by mouth every 12 (twelve) hours.    . metoprolol  tartrate (LOPRESSOR) 100 MG tablet Take 100 mg by mouth 2 (two) times daily.    . potassium chloride SA (KLOR-CON) 20 MEQ tablet Take 2 tablets (40 mEq total) by mouth daily. 90 tablet 1  . promethazine (PHENERGAN) 25 MG tablet Take 1 tablet (25 mg total) by mouth every 6 (six) hours as needed for nausea or vomiting. 90 tablet 0  . sucralfate (CARAFATE) 1 g tablet Take 1 tablet (1 g total) by mouth 4 (four) times daily -  before meals and at bedtime.    . traZODone (DESYREL) 100 MG tablet Take 2 tablets (200 mg total) by mouth at bedtime as needed for sleep.     No current facility-administered medications for this visit.    Allergies as of 11/25/2019 - Review Complete 11/25/2019  Allergen Reaction Noted  . Gabapentin Other (See Comments) 10/13/2019  . Lyrica [pregabalin] Other (See Comments) 10/13/2019  . Topamax [topiramate] Other (See Comments) 10/13/2019  . Fentanyl Rash 10/12/2019  . Morphine and related Rash 10/12/2019  Family History  Problem Relation Age of Onset  . Breast cancer Sister   . Colon cancer Other     Social History   Socioeconomic History  . Marital status: Single    Spouse name: Not on file  . Number of children: Not on file  . Years of education: Not on file  . Highest education level: Not on file  Occupational History  . Not on file  Tobacco Use  . Smoking status: Former Research scientist (life sciences)  . Smokeless tobacco: Never Used  Substance and Sexual Activity  . Alcohol use: Not Currently  . Drug use: Never  . Sexual activity: Not on file  Other Topics Concern  . Not on file  Social History Narrative  . Not on file   Social Determinants of Health   Financial Resource Strain:   . Difficulty of Paying Living Expenses: Not on file  Food Insecurity:   . Worried About Charity fundraiser in the Last Year: Not on file  . Ran Out of Food in the Last Year: Not on file  Transportation Needs:   . Lack of Transportation (Medical): Not on file  . Lack of  Transportation (Non-Medical): Not on file  Physical Activity:   . Days of Exercise per Week: Not on file  . Minutes of Exercise per Session: Not on file  Stress:   . Feeling of Stress : Not on file  Social Connections:   . Frequency of Communication with Friends and Family: Not on file  . Frequency of Social Gatherings with Friends and Family: Not on file  . Attends Religious Services: Not on file  . Active Member of Clubs or Organizations: Not on file  . Attends Archivist Meetings: Not on file  . Marital Status: Not on file  Intimate Partner Violence:   . Fear of Current or Ex-Partner: Not on file  . Emotionally Abused: Not on file  . Physically Abused: Not on file  . Sexually Abused: Not on file    Review of Systems: 12 system ROS is negative except as noted above with the addition of anxiety, arthritis, back pain, depression, fatigue, hearing problems, muscle pains, night sweats, insomnia, recent lower extremity edema.   Physical Exam: General:   Alert, thin woman in NAD.  She appears her stated age.  Stands during some of her visit. TPN 20 hours daily. TPN infusing in her left left thigh during this exam.  Head:  Normocephalic and atraumatic. Eyes:  Sclera clear, no icterus.   Conjunctiva pink. Ears:  Normal auditory acuity. Nose:  No deformity, discharge,  or lesions. Mouth:  No deformity or lesions.   Neck:  Supple; no masses or thyromegaly. Lungs:  Clear throughout to auscultation.   No wheezes. Heart:  Regular rate and rhythm; no murmurs.  No lower extremity edema. Abdomen:  Thin, soft, diffusely tender, multiple well healed surgical scars,  nondistended, normal bowel sounds, no rebound or guarding. No hepatosplenomegaly.   Rectal:  Deferred  Msk:  Symmetrical. No boney deformities. Tender over her lower back.  LAD: No inguinal or umbilical LAD Extremities:  No clubbing or edema. Neurologic:  Alert and  oriented x4;  grossly nonfocal Skin:  Intact without  significant lesions or rashes. Psych:  Alert and cooperative. Normal mood and affect.    Demoni Parmar L. Tarri Glenn, MD, MPH 11/25/2019, 11:12 AM

## 2019-11-25 ENCOUNTER — Emergency Department (HOSPITAL_COMMUNITY): Payer: Medicare Other

## 2019-11-25 ENCOUNTER — Other Ambulatory Visit: Payer: Self-pay

## 2019-11-25 ENCOUNTER — Inpatient Hospital Stay (HOSPITAL_COMMUNITY)
Admission: EM | Admit: 2019-11-25 | Discharge: 2019-11-30 | DRG: 444 | Disposition: A | Discharge status: Home Health | Payer: Medicare Other | Attending: Internal Medicine | Admitting: Internal Medicine

## 2019-11-25 ENCOUNTER — Encounter (HOSPITAL_COMMUNITY): Payer: Self-pay | Admitting: Emergency Medicine

## 2019-11-25 ENCOUNTER — Ambulatory Visit (INDEPENDENT_AMBULATORY_CARE_PROVIDER_SITE_OTHER): Payer: Medicare Other | Admitting: Gastroenterology

## 2019-11-25 ENCOUNTER — Encounter: Payer: Self-pay | Admitting: Gastroenterology

## 2019-11-25 VITALS — BP 124/70 | HR 51 | Temp 97.8°F | Ht 68.0 in | Wt 128.0 lb

## 2019-11-25 DIAGNOSIS — K5790 Diverticulosis of intestine, part unspecified, without perforation or abscess without bleeding: Secondary | ICD-10-CM | POA: Diagnosis present

## 2019-11-25 DIAGNOSIS — R197 Diarrhea, unspecified: Secondary | ICD-10-CM | POA: Diagnosis present

## 2019-11-25 DIAGNOSIS — R945 Abnormal results of liver function studies: Secondary | ICD-10-CM | POA: Diagnosis not present

## 2019-11-25 DIAGNOSIS — B9689 Other specified bacterial agents as the cause of diseases classified elsewhere: Secondary | ICD-10-CM | POA: Diagnosis present

## 2019-11-25 DIAGNOSIS — H269 Unspecified cataract: Secondary | ICD-10-CM | POA: Diagnosis present

## 2019-11-25 DIAGNOSIS — Z9119 Patient's noncompliance with other medical treatment and regimen: Secondary | ICD-10-CM

## 2019-11-25 DIAGNOSIS — Z83511 Family history of glaucoma: Secondary | ICD-10-CM

## 2019-11-25 DIAGNOSIS — Z832 Family history of diseases of the blood and blood-forming organs and certain disorders involving the immune mechanism: Secondary | ICD-10-CM

## 2019-11-25 DIAGNOSIS — N1831 Chronic kidney disease, stage 3a: Secondary | ICD-10-CM | POA: Diagnosis present

## 2019-11-25 DIAGNOSIS — T400X5A Adverse effect of opium, initial encounter: Secondary | ICD-10-CM | POA: Diagnosis present

## 2019-11-25 DIAGNOSIS — G894 Chronic pain syndrome: Secondary | ICD-10-CM | POA: Diagnosis present

## 2019-11-25 DIAGNOSIS — Z9049 Acquired absence of other specified parts of digestive tract: Secondary | ICD-10-CM

## 2019-11-25 DIAGNOSIS — R7881 Bacteremia: Secondary | ICD-10-CM | POA: Diagnosis not present

## 2019-11-25 DIAGNOSIS — E43 Unspecified severe protein-calorie malnutrition: Secondary | ICD-10-CM | POA: Diagnosis present

## 2019-11-25 DIAGNOSIS — Z79891 Long term (current) use of opiate analgesic: Secondary | ICD-10-CM

## 2019-11-25 DIAGNOSIS — D638 Anemia in other chronic diseases classified elsewhere: Secondary | ICD-10-CM | POA: Diagnosis present

## 2019-11-25 DIAGNOSIS — K861 Other chronic pancreatitis: Secondary | ICD-10-CM | POA: Diagnosis present

## 2019-11-25 DIAGNOSIS — H209 Unspecified iridocyclitis: Secondary | ICD-10-CM | POA: Diagnosis present

## 2019-11-25 DIAGNOSIS — Z803 Family history of malignant neoplasm of breast: Secondary | ICD-10-CM

## 2019-11-25 DIAGNOSIS — M879 Osteonecrosis, unspecified: Secondary | ICD-10-CM | POA: Diagnosis present

## 2019-11-25 DIAGNOSIS — K831 Obstruction of bile duct: Secondary | ICD-10-CM | POA: Diagnosis present

## 2019-11-25 DIAGNOSIS — R1084 Generalized abdominal pain: Secondary | ICD-10-CM | POA: Diagnosis present

## 2019-11-25 DIAGNOSIS — K5090 Crohn's disease, unspecified, without complications: Secondary | ICD-10-CM | POA: Diagnosis present

## 2019-11-25 DIAGNOSIS — K912 Postsurgical malabsorption, not elsewhere classified: Secondary | ICD-10-CM

## 2019-11-25 DIAGNOSIS — Z79899 Other long term (current) drug therapy: Secondary | ICD-10-CM

## 2019-11-25 DIAGNOSIS — Z820 Family history of epilepsy and other diseases of the nervous system: Secondary | ICD-10-CM

## 2019-11-25 DIAGNOSIS — W3400XS Accidental discharge from unspecified firearms or gun, sequela: Secondary | ICD-10-CM

## 2019-11-25 DIAGNOSIS — R109 Unspecified abdominal pain: Secondary | ICD-10-CM | POA: Diagnosis not present

## 2019-11-25 DIAGNOSIS — F1120 Opioid dependence, uncomplicated: Secondary | ICD-10-CM | POA: Diagnosis present

## 2019-11-25 DIAGNOSIS — Z888 Allergy status to other drugs, medicaments and biological substances status: Secondary | ICD-10-CM | POA: Diagnosis not present

## 2019-11-25 DIAGNOSIS — Z7952 Long term (current) use of systemic steroids: Secondary | ICD-10-CM

## 2019-11-25 DIAGNOSIS — K50818 Crohn's disease of both small and large intestine with other complication: Secondary | ICD-10-CM | POA: Diagnosis present

## 2019-11-25 DIAGNOSIS — E860 Dehydration: Secondary | ICD-10-CM | POA: Diagnosis present

## 2019-11-25 DIAGNOSIS — K8309 Other cholangitis: Principal | ICD-10-CM

## 2019-11-25 DIAGNOSIS — Z87891 Personal history of nicotine dependence: Secondary | ICD-10-CM

## 2019-11-25 DIAGNOSIS — K909 Intestinal malabsorption, unspecified: Secondary | ICD-10-CM

## 2019-11-25 DIAGNOSIS — Z20822 Contact with and (suspected) exposure to covid-19: Secondary | ICD-10-CM | POA: Diagnosis present

## 2019-11-25 DIAGNOSIS — D696 Thrombocytopenia, unspecified: Secondary | ICD-10-CM | POA: Diagnosis present

## 2019-11-25 DIAGNOSIS — R112 Nausea with vomiting, unspecified: Secondary | ICD-10-CM | POA: Diagnosis present

## 2019-11-25 DIAGNOSIS — D72829 Elevated white blood cell count, unspecified: Secondary | ICD-10-CM | POA: Diagnosis not present

## 2019-11-25 DIAGNOSIS — K5010 Crohn's disease of large intestine without complications: Secondary | ICD-10-CM | POA: Diagnosis present

## 2019-11-25 DIAGNOSIS — K5150 Left sided colitis without complications: Secondary | ICD-10-CM | POA: Diagnosis present

## 2019-11-25 DIAGNOSIS — D509 Iron deficiency anemia, unspecified: Secondary | ICD-10-CM | POA: Diagnosis present

## 2019-11-25 DIAGNOSIS — K50018 Crohn's disease of small intestine with other complication: Secondary | ICD-10-CM | POA: Diagnosis not present

## 2019-11-25 DIAGNOSIS — F329 Major depressive disorder, single episode, unspecified: Secondary | ICD-10-CM | POA: Diagnosis present

## 2019-11-25 DIAGNOSIS — Z800 Family history of malignant neoplasm of digestive organs: Secondary | ICD-10-CM

## 2019-11-25 DIAGNOSIS — Z8249 Family history of ischemic heart disease and other diseases of the circulatory system: Secondary | ICD-10-CM

## 2019-11-25 DIAGNOSIS — K219 Gastro-esophageal reflux disease without esophagitis: Secondary | ICD-10-CM | POA: Diagnosis present

## 2019-11-25 DIAGNOSIS — R748 Abnormal levels of other serum enzymes: Secondary | ICD-10-CM | POA: Diagnosis present

## 2019-11-25 DIAGNOSIS — Z681 Body mass index (BMI) 19 or less, adult: Secondary | ICD-10-CM | POA: Diagnosis not present

## 2019-11-25 DIAGNOSIS — K50118 Crohn's disease of large intestine with other complication: Secondary | ICD-10-CM | POA: Diagnosis not present

## 2019-11-25 DIAGNOSIS — I129 Hypertensive chronic kidney disease with stage 1 through stage 4 chronic kidney disease, or unspecified chronic kidney disease: Secondary | ICD-10-CM | POA: Diagnosis present

## 2019-11-25 DIAGNOSIS — R609 Edema, unspecified: Secondary | ICD-10-CM | POA: Diagnosis not present

## 2019-11-25 DIAGNOSIS — E876 Hypokalemia: Secondary | ICD-10-CM | POA: Diagnosis present

## 2019-11-25 DIAGNOSIS — K50819 Crohn's disease of both small and large intestine with unspecified complications: Secondary | ICD-10-CM | POA: Diagnosis not present

## 2019-11-25 DIAGNOSIS — F419 Anxiety disorder, unspecified: Secondary | ICD-10-CM | POA: Diagnosis present

## 2019-11-25 DIAGNOSIS — M818 Other osteoporosis without current pathological fracture: Secondary | ICD-10-CM | POA: Diagnosis present

## 2019-11-25 DIAGNOSIS — E538 Deficiency of other specified B group vitamins: Secondary | ICD-10-CM | POA: Diagnosis present

## 2019-11-25 DIAGNOSIS — Z833 Family history of diabetes mellitus: Secondary | ICD-10-CM

## 2019-11-25 DIAGNOSIS — R7989 Other specified abnormal findings of blood chemistry: Secondary | ICD-10-CM

## 2019-11-25 DIAGNOSIS — T380X5A Adverse effect of glucocorticoids and synthetic analogues, initial encounter: Secondary | ICD-10-CM | POA: Diagnosis present

## 2019-11-25 LAB — CBC WITH DIFFERENTIAL/PLATELET
Abs Immature Granulocytes: 0.02 10*3/uL (ref 0.00–0.07)
Basophils Absolute: 0 10*3/uL (ref 0.0–0.1)
Basophils Relative: 1 %
Eosinophils Absolute: 0.1 10*3/uL (ref 0.0–0.5)
Eosinophils Relative: 2 %
HCT: 28.2 % — ABNORMAL LOW (ref 36.0–46.0)
Hemoglobin: 8.9 g/dL — ABNORMAL LOW (ref 12.0–15.0)
Immature Granulocytes: 1 %
Lymphocytes Relative: 25 %
Lymphs Abs: 1.1 10*3/uL (ref 0.7–4.0)
MCH: 29.3 pg (ref 26.0–34.0)
MCHC: 31.6 g/dL (ref 30.0–36.0)
MCV: 92.8 fL (ref 80.0–100.0)
Monocytes Absolute: 0.3 10*3/uL (ref 0.1–1.0)
Monocytes Relative: 7 %
Neutro Abs: 2.9 10*3/uL (ref 1.7–7.7)
Neutrophils Relative %: 64 %
Platelets: 174 10*3/uL (ref 150–400)
RBC: 3.04 MIL/uL — ABNORMAL LOW (ref 3.87–5.11)
RDW: 14.6 % (ref 11.5–15.5)
WBC: 4.4 10*3/uL (ref 4.0–10.5)
nRBC: 0 % (ref 0.0–0.2)

## 2019-11-25 LAB — URINALYSIS, ROUTINE W REFLEX MICROSCOPIC
Bilirubin Urine: NEGATIVE
Glucose, UA: NEGATIVE mg/dL
Hgb urine dipstick: NEGATIVE
Ketones, ur: NEGATIVE mg/dL
Leukocytes,Ua: NEGATIVE
Nitrite: NEGATIVE
Protein, ur: 30 mg/dL — AB
Specific Gravity, Urine: 1.021 (ref 1.005–1.030)
pH: 6 (ref 5.0–8.0)

## 2019-11-25 LAB — C DIFFICILE QUICK SCREEN W PCR REFLEX
C Diff antigen: NEGATIVE
C Diff interpretation: NOT DETECTED
C Diff toxin: NEGATIVE

## 2019-11-25 LAB — COMPREHENSIVE METABOLIC PANEL
ALT: 20 U/L (ref 0–44)
AST: 14 U/L — ABNORMAL LOW (ref 15–41)
Albumin: 3.5 g/dL (ref 3.5–5.0)
Alkaline Phosphatase: 167 U/L — ABNORMAL HIGH (ref 38–126)
Anion gap: 9 (ref 5–15)
BUN: 39 mg/dL — ABNORMAL HIGH (ref 6–20)
CO2: 26 mmol/L (ref 22–32)
Calcium: 9.4 mg/dL (ref 8.9–10.3)
Chloride: 103 mmol/L (ref 98–111)
Creatinine, Ser: 1.37 mg/dL — ABNORMAL HIGH (ref 0.44–1.00)
GFR calc Af Amer: 49 mL/min — ABNORMAL LOW (ref >60)
GFR calc non Af Amer: 42 mL/min — ABNORMAL LOW (ref >60)
Glucose, Bld: 98 mg/dL (ref 70–99)
Potassium: 4 mmol/L (ref 3.5–5.1)
Sodium: 138 mmol/L (ref 135–145)
Total Bilirubin: 0.9 mg/dL (ref 0.3–1.2)
Total Protein: 7.2 g/dL (ref 6.5–8.1)

## 2019-11-25 LAB — LIPASE, BLOOD: Lipase: 24 U/L (ref 11–51)

## 2019-11-25 LAB — LACTIC ACID, PLASMA
Lactic Acid, Venous: 0.5 mmol/L (ref 0.5–1.9)
Lactic Acid, Venous: 2.1 mmol/L (ref 0.5–1.9)

## 2019-11-25 LAB — SEDIMENTATION RATE: Sed Rate: 35 mm/h — ABNORMAL HIGH (ref 0–22)

## 2019-11-25 LAB — SARS CORONAVIRUS 2 (TAT 6-24 HRS): SARS Coronavirus 2: NEGATIVE

## 2019-11-25 LAB — C-REACTIVE PROTEIN: CRP: 1 mg/dL — ABNORMAL HIGH

## 2019-11-25 MED ORDER — HYDROMORPHONE HCL 1 MG/ML IJ SOLN
1.0000 mg | Freq: Once | INTRAMUSCULAR | Status: AC
  Administered 2019-11-25: 1 mg via INTRAVENOUS
  Filled 2019-11-25: qty 1

## 2019-11-25 MED ORDER — ONDANSETRON HCL 4 MG/2ML IJ SOLN
4.0000 mg | Freq: Four times a day (QID) | INTRAMUSCULAR | Status: DC | PRN
  Administered 2019-11-26 – 2019-11-30 (×4): 4 mg via INTRAVENOUS
  Filled 2019-11-25 (×3): qty 2

## 2019-11-25 MED ORDER — CIPROFLOXACIN IN D5W 400 MG/200ML IV SOLN
400.0000 mg | Freq: Two times a day (BID) | INTRAVENOUS | Status: DC
  Administered 2019-11-26 (×2): 400 mg via INTRAVENOUS
  Filled 2019-11-25 (×2): qty 200

## 2019-11-25 MED ORDER — METRONIDAZOLE IN NACL 5-0.79 MG/ML-% IV SOLN
500.0000 mg | Freq: Three times a day (TID) | INTRAVENOUS | Status: DC
  Administered 2019-11-25 – 2019-11-30 (×14): 500 mg via INTRAVENOUS
  Filled 2019-11-25 (×14): qty 100

## 2019-11-25 MED ORDER — IOHEXOL 9 MG/ML PO SOLN
ORAL | Status: AC
  Administered 2019-11-25: 1000 mL via ORAL
  Filled 2019-11-25: qty 1000

## 2019-11-25 MED ORDER — PROMETHAZINE HCL 25 MG/ML IJ SOLN: 25.0000 mg | Freq: Once | INTRAMUSCULAR | Status: DC

## 2019-11-25 MED ORDER — PROCHLORPERAZINE EDISYLATE 10 MG/2ML IJ SOLN
10.0000 mg | Freq: Four times a day (QID) | INTRAMUSCULAR | Status: DC | PRN
  Administered 2019-11-26 – 2019-11-30 (×13): 10 mg via INTRAVENOUS
  Filled 2019-11-25 (×14): qty 2

## 2019-11-25 MED ORDER — PROMETHAZINE HCL 25 MG/ML IJ SOLN
25.0000 mg | Freq: Once | INTRAMUSCULAR | Status: AC
  Administered 2019-11-25: 25 mg via INTRAVENOUS
  Filled 2019-11-25: qty 1

## 2019-11-25 MED ORDER — PROCHLORPERAZINE EDISYLATE 10 MG/2ML IJ SOLN
10.0000 mg | Freq: Once | INTRAMUSCULAR | Status: AC
  Administered 2019-11-25: 10 mg via INTRAVENOUS
  Filled 2019-11-25: qty 2

## 2019-11-25 MED ORDER — HYDROMORPHONE HCL 1 MG/ML IJ SOLN
1.0000 mg | INTRAMUSCULAR | Status: DC | PRN
  Administered 2019-11-26 – 2019-11-30 (×24): 1 mg via INTRAVENOUS
  Filled 2019-11-25 (×24): qty 1

## 2019-11-25 MED ORDER — ACETAMINOPHEN 650 MG RE SUPP: 650.0000 mg | Freq: Four times a day (QID) | RECTAL | Status: DC | PRN

## 2019-11-25 MED ORDER — DIPHENHYDRAMINE HCL 50 MG/ML IJ SOLN
25.0000 mg | Freq: Once | INTRAMUSCULAR | Status: AC
  Administered 2019-11-25: 25 mg via INTRAVENOUS
  Filled 2019-11-25: qty 1

## 2019-11-25 MED ORDER — IOHEXOL 9 MG/ML PO SOLN
500.0000 mL | ORAL | Status: AC
  Administered 2019-11-25: 500 mL via ORAL

## 2019-11-25 MED ORDER — KETOROLAC TROMETHAMINE 15 MG/ML IJ SOLN: 15.0000 mg | Freq: Four times a day (QID) | INTRAMUSCULAR | Status: DC | PRN

## 2019-11-25 MED ORDER — SODIUM CHLORIDE 0.9 % IV BOLUS
1000.0000 mL | Freq: Once | INTRAVENOUS | Status: AC
  Administered 2019-11-25: 1000 mL via INTRAVENOUS

## 2019-11-25 MED ORDER — HEPARIN SODIUM (PORCINE) 5000 UNIT/ML IJ SOLN
5000.0000 [IU] | Freq: Three times a day (TID) | INTRAMUSCULAR | Status: DC
  Administered 2019-11-26 – 2019-11-30 (×13): 5000 [IU] via SUBCUTANEOUS
  Filled 2019-11-25 (×13): qty 1

## 2019-11-25 MED ORDER — SODIUM CHLORIDE 0.9 % IV SOLN: INTRAVENOUS | Status: DC

## 2019-11-25 MED ORDER — ACETAMINOPHEN 325 MG PO TABS
650.0000 mg | ORAL_TABLET | Freq: Four times a day (QID) | ORAL | Status: DC | PRN
  Administered 2019-11-25 – 2019-11-28 (×4): 650 mg via ORAL
  Filled 2019-11-25 (×4): qty 2

## 2019-11-25 MED ORDER — PROMETHAZINE HCL 25 MG/ML IJ SOLN
25.0000 mg | Freq: Four times a day (QID) | INTRAMUSCULAR | Status: DC | PRN
  Administered 2019-11-26 (×3): 25 mg via INTRAVENOUS
  Filled 2019-11-25 (×3): qty 1

## 2019-11-25 MED ORDER — IOHEXOL 300 MG/ML  SOLN
80.0000 mL | Freq: Once | INTRAMUSCULAR | Status: AC | PRN
  Administered 2019-11-25: 80 mL via INTRAVENOUS

## 2019-11-25 MED ORDER — ONDANSETRON HCL 4 MG PO TABS: 4.0000 mg | ORAL_TABLET | Freq: Four times a day (QID) | ORAL | Status: DC | PRN

## 2019-11-25 NOTE — Patient Instructions (Signed)
If you are age 58 or older, your body mass index should be between 23-30. Your Body mass index is 19.46 kg/m. If this is out of the aforementioned range listed, please consider follow up with your Primary Care Provider.  If you are age 77 or younger, your body mass index should be between 19-25. Your Body mass index is 19.46 kg/m. If this is out of the aformentioned range listed, please consider follow up with your Primary Care Provider.   Due to recent changes in healthcare laws, you may see the results of your imaging and laboratory studies on MyChart before your provider has had a chance to review them.  We understand that in some cases there may be results that are confusing or concerning to you. Not all laboratory results come back in the same time frame and the provider may be waiting for multiple results in order to interpret others.  Please give [place] 48 hours in order for your provider to thoroughly review all the results before contacting the office for clarification of your results.

## 2019-11-25 NOTE — ED Provider Notes (Signed)
[facility] DEPT Provider Note   CSN: [id number] Arrival date & time: 11/25/19  1148     History Chief Complaint  Patient presents with  . Abdominal Pain    Caitlin Peters is a 58 y.o. female.  The history is provided by the patient and medical records. No language interpreter was used.  Abdominal Pain Pain location:  Generalized Pain quality: aching   Pain radiates to:  Does not radiate Pain severity:  Severe Onset quality:  Gradual Duration:  5 days Timing:  Constant Progression:  Waxing and waning Chronicity:  Chronic Context: previous surgery   Context: not suspicious food intake   Relieved by:  Nothing Worsened by:  Palpation and eating Ineffective treatments:  None tried Associated symptoms: diarrhea, fatigue, nausea and vomiting   Associated symptoms: no chest pain, no chills, no constipation, no cough, no dysuria, no fever and no shortness of breath        Past Medical History:  Diagnosis Date  . Anasarca 10/2019  . AVN (avascular necrosis of bone) ([place])   . Cataract   . Chronic pain syndrome   . CKD (chronic kidney disease), stage III   . Crohn disease ([place])   . Crohn disease ([place])   . Depression   . Diverticulosis   . GERD (gastroesophageal reflux disease)   . HTN (hypertension)   . IDA (iron deficiency anemia)   . Malnutrition ([place])   . Mass in chest   . Osteoporosis   . Pancreatitis   . Short gut syndrome   . Vitamin B12 deficiency     Patient Active Problem List   Diagnosis Date Noted  . Anasarca 10/28/2019  . Acute kidney injury superimposed on chronic kidney disease ([place]) 10/28/2019  . Falls 10/28/2019  . Crohn disease ([place])   . Malnutrition ([place])   . Short gut syndrome   . HTN (hypertension)   . Vitamin B12 deficiency   . Osteoporosis   . Depression   . GERD (gastroesophageal reflux disease)   . Chronic pain syndrome   . Hypokalemia due to excessive gastrointestinal loss of potassium 10/13/2019    . Diarrhea   . Fever   . Hypokalemia 10/12/2019  . Chronic diarrhea 10/12/2019  . Dysuria 10/12/2019  . Bilateral lower extremity edema 10/12/2019  . Crohn's disease ([place]) 10/12/2019  . Anemia 10/12/2019  . Hypertension 10/12/2019  . AKI (acute kidney injury) ([place]) 10/12/2019    Past Surgical History:  Procedure Laterality Date  . ABDOMINAL HYSTERECTOMY    . ABDOMINAL SURGERY     colon resection with abdominal stoma, repair of intestinal leak, reversal of abdominal stoma  . CHEST WALL RESECTION     right thoracotomy,resection of chest mass with anterior rib and reconstruction using prosthetic mesh and video arthroscopy  . CHOLECYSTECTOMY    . COLONOSCOPY  2019  . IR US GUIDE VASC ACCESS LEFT     x 2 06/17/19 and 09/14/2019  . KNEE SURGERY     right knee   . SMALL INTESTINE SURGERY     x3  . UPPER GASTROINTESTINAL ENDOSCOPY       OB History   No obstetric history on file.     Family History  Problem Relation Age of Onset  . Breast cancer Sister   . Multiple sclerosis Sister   . Diabetes Sister   . Lupus Sister   . Colon cancer Other   . Crohn's disease Other   . Seizures Mother   .  Glaucoma Mother   . CAD Father   . Heart disease Father   . Hypertension Father     Social History   Tobacco Use  . Smoking status: Former Research scientist (life sciences)  . Smokeless tobacco: Never Used  Substance Use Topics  . Alcohol use: Not Currently  . Drug use: Never    Home Medications Prior to Admission medications   Medication Sig Start Date End Date Taking? Authorizing Provider  acetaminophen (TYLENOL) 325 MG tablet Take 650 mg by mouth every 6 (six) hours as needed for mild pain.     [provider]  amLODipine (NORVASC) 5 MG tablet Take 1 tablet (5 mg total) by mouth daily. 10/16/19   [name], [name], MD  budesonide (ENTOCORT EC) 3 MG 24 hr capsule Take 3 capsules (9 mg total) by mouth daily. 11/01/19   Dessa Phi, DO  buPROPion (WELLBUTRIN SR) 100 MG 12 hr  tablet Take 100 mg by mouth 2 (two) times daily.    [provider]  cycloSPORINE (RESTASIS) 0.05 % ophthalmic emulsion Place 1 drop into both eyes 2 (two) times daily.    [provider]  dexlansoprazole (DEXILANT) 60 MG capsule Take 1 capsule (60 mg total) by mouth daily. 10/16/19   [name], [name], MD  dicyclomine (BENTYL) 10 MG capsule Take 10 mg by mouth 3 (three) times daily before meals.    [provider]  diphenoxylate-atropine (LOMOTIL) 2.5-0.025 MG tablet Take 1 tablet by mouth 4 (four) times daily as needed for diarrhea or loose stools. 10/31/19   Dessa Phi, DO  DULoxetine (CYMBALTA) 30 MG capsule Take 3 capsules (90 mg total) by mouth daily. 11/01/19   Dessa Phi, DO  estradiol (ESTRACE) 2 MG tablet Take 2 mg by mouth daily.    [provider]  HYDROmorphone (DILAUDID) 4 MG tablet Take 4 mg by mouth every 6 (six) hours as needed for moderate pain or severe pain.    [provider]  lipase/protease/amylase (CREON) 36000 UNITS CPEP capsule Take 1 capsule (36,000 Units total) by mouth 3 (three) times daily before meals. 10/31/19   Dessa Phi, DO  loperamide (IMODIUM) 2 MG capsule Take 1 capsule (2 mg total) by mouth as needed for diarrhea or loose stools. 10/16/19   [name], [name], MD  methadone (DOLOPHINE) 10 MG tablet Take 20 mg by mouth every 12 (twelve) hours.    [provider]  metoprolol tartrate (LOPRESSOR) 100 MG tablet Take 100 mg by mouth 2 (two) times daily.    [provider]  potassium chloride SA (KLOR-CON) 20 MEQ tablet Take 2 tablets (40 mEq total) by mouth daily. 10/16/19   [name], [name], MD  promethazine (PHENERGAN) 25 MG tablet Take 1 tablet (25 mg total) by mouth every 6 (six) hours as needed for nausea or vomiting. 11/19/19   [name], [name], MD  sucralfate (CARAFATE) 1 g tablet Take 1 tablet (1 g total) by mouth 4 (four) times daily -  before meals and  at bedtime. 10/31/19   Dessa Phi, DO  traZODone (DESYREL) 100 MG tablet Take 2 tablets (200 mg total) by mouth at bedtime as needed for sleep. 10/31/19   Dessa Phi, DO    Allergies    Gabapentin, Lyrica [pregabalin], Topamax [topiramate], Fentanyl, and Morphine and related  Review of Systems   Review of Systems  Constitutional: Positive for fatigue and unexpected weight change (wtr loss). Negative for chills, diaphoresis and fever.  Respiratory: Negative for  cough, chest tightness and shortness of breath.   Cardiovascular: Negative for chest pain, palpitations and leg swelling.  Gastrointestinal: Positive for abdominal pain, diarrhea, nausea and vomiting. Negative for constipation.  Genitourinary: Negative for dysuria, flank pain and frequency.  Musculoskeletal: Negative for back pain, neck pain and neck stiffness.  Skin: Negative for rash and wound.  Neurological: Negative for light-headedness and headaches.  Psychiatric/Behavioral: Negative for agitation.  All other systems reviewed and are negative.   Physical Exam Updated Vital Signs BP (!) 166/70 (BP Location: Left Arm)   Pulse 67   Temp 99 F (37.2 C) (Oral)   Resp 18   Ht 5' 8"  (1.727 m)   Wt 56.2 kg   SpO2 100%   BMI 18.85 kg/m   Physical Exam Vitals and nursing note reviewed.  Constitutional:      General: She is not in acute distress.    Appearance: She is well-developed. She is not ill-appearing, toxic-appearing or diaphoretic.  HENT:     Head: Normocephalic and atraumatic.     Right Ear: External ear normal.     Left Ear: External ear normal.     Nose: Nose normal.  Eyes:     General: No scleral icterus.    Conjunctiva/sclera: Conjunctivae normal.  Cardiovascular:     Rate and Rhythm: Normal rate.     Heart sounds: Normal heart sounds. No murmur.  Pulmonary:     Effort: Pulmonary effort is normal. No respiratory distress.     Breath sounds: Normal breath sounds. No stridor. No wheezing, rhonchi  or rales.  Chest:     Chest wall: No tenderness.  Abdominal:     General: Abdomen is flat. A surgical scar is present. Bowel sounds are normal. There is no distension.     Tenderness: There is generalized abdominal tenderness. There is no right CVA tenderness, left CVA tenderness, guarding or rebound.  Musculoskeletal:     Cervical back: Normal range of motion and neck supple.  Skin:    General: Skin is warm.     Findings: No erythema or rash.  Neurological:     General: No focal deficit present.     Mental Status: She is alert and oriented to person, place, and time.     Motor: No abnormal muscle tone.     Coordination: Coordination normal.     Deep Tendon Reflexes: Reflexes are normal and symmetric.  Psychiatric:        Mood and Affect: Mood normal.     ED Results / Procedures / Treatments   Labs (all labs ordered are listed, but only abnormal results are displayed) Labs Reviewed  URINE CULTURE  SARS CORONAVIRUS 2 (TAT 6-24 HRS)  CBC WITH DIFFERENTIAL/PLATELET  COMPREHENSIVE METABOLIC PANEL  LACTIC ACID, PLASMA  LACTIC ACID, PLASMA  LIPASE, BLOOD  URINALYSIS, ROUTINE W REFLEX MICROSCOPIC    EKG None  Radiology DG Hip [place] W or [place] Pelvis 1 View Left  Result Date: 11/25/2019 CLINICAL DATA:  PICC line placement EXAM: DG HIP (WITH OR WITHOUT PELVIS) 1V PORT LEFT COMPARISON:  October 12, 2019. FINDINGS: There is a central venous catheter projecting over the patient's left pelvis. The tip of this catheter is not visualized on this exam. Exact positioning cannot be determined on this study. There is a sclerotic area within the left iliac bone, stable from prior study. IMPRESSION: PICC line not fully visualized on this exam. Exact positioning cannot be determined. Electronically Signed   By: Jamie Kato.D.  On: 11/25/2019 16:14    Procedures Procedures (including critical care time)  Medications Ordered in ED Medications  sodium chloride 0.9 % bolus  1,000 mL (has no administration in time range)  HYDROmorphone (DILAUDID) injection 1 mg (has no administration in time range)  promethazine (PHENERGAN) injection 25 mg (has no administration in time range)  iohexol (OMNIPAQUE) 9 MG/ML oral solution 500 mL (has no administration in time range)  iohexol (OMNIPAQUE) 9 MG/ML oral solution (1,000 mLs Oral Contrast Given 11/25/19 1533)    ED Course  I have reviewed the triage vital signs and the nursing notes.  Pertinent labs & imaging results that were available during my care of the patient were reviewed by me and considered in my medical decision making (see chart for details).    MDM Rules/Calculators/A&P                      JASMINEMARIE SHERRARD is a 58 y.o. 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  Patient reports that she is from [place] where she has had most of her care.  She reports that she has not yet been able to get established successfully with a pain team here in [place] and reports that she saw her gastroenterologist today who felt that she needed to come to the emergency department and she was unable to be helped.  Patient says that for the last 2 days she has not been to eat anything or drink anything due to nausea vomiting and abdominal pain.  She is concerned she may be having a Crohn's flare.  She reports no significant urinary symptoms or reports diarrhea that is worse than her normal.  She reports that the pain is unbearable and she has no pain medicine at home.  She reports no new fevers or chills or respiratory symptoms or chest pain.  She denies trauma.  She denies any known Covid contacts.  She denies other complaints.  On exam, she has  diffuse abdominal pain.  She has surgical scars on her abdomen from previous surgery.  She has left leg line in place for her TPN which finished before I examined her.  She had no leg tenderness or leg swelling.  She does have some tenderness on her low back because she feels she is "wasting away" and her skin is becoming more thin with pain with her tailbone.  Lungs were clear and chest was nontender.  She did have bowel sounds.  Clinically I am concerned patient may have Crohn's flare based on her history of similar symptoms.  We had a discussion and she is agreeable to getting labs, urinalysis, and CT scan to look for flare.  She will also be given pain medicine, nausea, and fluids.  Anticipate reassessment after work-up to determine if she is stable for discharge home.    Final Clinical Impression(s) / ED Diagnoses Final diagnoses:  Generalized abdominal pain  Diarrhea, unspecified type  Intractable vomiting with nausea, unspecified vomiting type    Clinical Impression: 1. Generalized abdominal pain   2. Diarrhea, unspecified type   3. Intractable vomiting with nausea, unspecified vomiting type     Disposition: Care transferred to oncoming team while awaiting results of diagnostic work-up and reassessment after medications and  fluids.  This note was prepared with assistance of Systems analyst. Occasional wrong-word or sound-a-like substitutions may have occurred due to the inherent limitations of voice recognition software.      Yifan Auker, Gwenyth Allegra, MD 11/25/19 [phone]

## 2019-11-25 NOTE — ED Triage Notes (Addendum)
Patient reports hx Chron's. States worsening abdominal pain with diarrhea and unable to eat x5 days. Getting TPN at this time.  Patient reports she is a difficult stick and is requesting blood draw once in treatment room.

## 2019-11-25 NOTE — ED Notes (Signed)
Notified Tom, RN pt temp 101.0. Tylenol was administered, flagyl started and blood drawn.

## 2019-11-25 NOTE — ED Notes (Signed)
Pt assisted on and off of the bedpan. Pt requesting more pain medication. EDP made aware.

## 2019-11-25 NOTE — ED Notes (Signed)
Pt transported to CT ?

## 2019-11-25 NOTE — ED Notes (Signed)
Pt in room stating "help me, please help me." Pt stated she has restless legs and is also having arm pain. EDP made aware.

## 2019-11-25 NOTE — ED Notes (Signed)
Pt placed on bedpan

## 2019-11-25 NOTE — ED Notes (Addendum)
Pt has PICC line that was placed in [place], in order to use the Xray needs to be performed. Dr. Billy Fischer notified and requested Xray.

## 2019-11-25 NOTE — ED Provider Notes (Signed)
  Physical Exam  BP 132/72   Pulse 96   Temp 100 F (37.8 C) (Oral)   Resp 18   Ht 5' 8"  (1.727 m)   Wt 56.2 kg   SpO2 94%   BMI 18.85 kg/m   Physical Exam  ED Course/Procedures     Procedures  MDM   Received care of patient at 3:30 PM from Dr. Sherry Ruffing.  Please see his note for prior history, physical and care.  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 Tarri Glenn today in gastroenterology clinic and referred to the emergency department with concern for abdominal pain, diarrhea, dehydration.  Labs and CT pending.  CT shows concern for proctocolitis.  No leukocytosis.  Discussed with Dr. Havery Moros.  Given overall clinical picture, history, CT findings, will order ESR, CRP, hydrate patient, not initiate steroids at this time.  While in the emergency department, patient's temperature has continued to rise, and is 100.0 at time of my evaluation.  Given her history of prior bacteremia and line infections, ordered blood cultures.  She is not technically febrile at this time, does not show signs of sepsis, will hold off on further antibiotics in ED, especially given concern for possible underlying C. difficile infection-0-however will admit for continued monitoring, blood cx, possible abx, hydration, pain control.         Gareth Morgan, MD 11/26/19 [phone]

## 2019-11-25 NOTE — ED Notes (Signed)
Pt had large bowel movement in bed. Pt was cleaned, linens were changed and new gown was placed on pt. Stool sample collected, EDP made aware.

## 2019-11-26 ENCOUNTER — Inpatient Hospital Stay (HOSPITAL_COMMUNITY): Payer: Medicare Other | Admitting: Certified Registered Nurse Anesthetist

## 2019-11-26 ENCOUNTER — Encounter (HOSPITAL_COMMUNITY)
Admission: EM | Disposition: A | Discharge status: Home Health | Payer: Self-pay | Source: Home / Self Care | Attending: Internal Medicine

## 2019-11-26 ENCOUNTER — Inpatient Hospital Stay (HOSPITAL_COMMUNITY): Payer: Medicare Other

## 2019-11-26 ENCOUNTER — Encounter (HOSPITAL_COMMUNITY): Payer: Self-pay | Admitting: Internal Medicine

## 2019-11-26 DIAGNOSIS — K912 Postsurgical malabsorption, not elsewhere classified: Secondary | ICD-10-CM

## 2019-11-26 DIAGNOSIS — K8309 Other cholangitis: Principal | ICD-10-CM

## 2019-11-26 DIAGNOSIS — R197 Diarrhea, unspecified: Secondary | ICD-10-CM

## 2019-11-26 DIAGNOSIS — D72829 Elevated white blood cell count, unspecified: Secondary | ICD-10-CM

## 2019-11-26 DIAGNOSIS — R945 Abnormal results of liver function studies: Secondary | ICD-10-CM

## 2019-11-26 DIAGNOSIS — K831 Obstruction of bile duct: Secondary | ICD-10-CM

## 2019-11-26 DIAGNOSIS — K50118 Crohn's disease of large intestine with other complication: Secondary | ICD-10-CM

## 2019-11-26 HISTORY — PX: BILIARY DILATION: SHX6850

## 2019-11-26 HISTORY — PX: REMOVAL OF STONES: SHX5545

## 2019-11-26 HISTORY — PX: ERCP: SHX5425

## 2019-11-26 HISTORY — PX: SPHINCTEROTOMY: SHX5544

## 2019-11-26 LAB — BLOOD CULTURE ID PANEL (REFLEXED)
Acinetobacter baumannii: NOT DETECTED
Candida albicans: NOT DETECTED
Candida glabrata: NOT DETECTED
Candida krusei: NOT DETECTED
Candida parapsilosis: NOT DETECTED
Candida tropicalis: NOT DETECTED
Carbapenem resistance: NOT DETECTED
Enterobacter cloacae complex: DETECTED — AB
Enterobacteriaceae species: DETECTED — AB
Enterococcus species: NOT DETECTED
Escherichia coli: NOT DETECTED
Haemophilus influenzae: NOT DETECTED
Klebsiella oxytoca: NOT DETECTED
Klebsiella pneumoniae: NOT DETECTED
Listeria monocytogenes: NOT DETECTED
Neisseria meningitidis: NOT DETECTED
Proteus species: NOT DETECTED
Pseudomonas aeruginosa: NOT DETECTED
Serratia marcescens: NOT DETECTED
Staphylococcus aureus (BCID): NOT DETECTED
Staphylococcus species: NOT DETECTED
Streptococcus agalactiae: NOT DETECTED
Streptococcus pneumoniae: NOT DETECTED
Streptococcus pyogenes: NOT DETECTED
Streptococcus species: NOT DETECTED

## 2019-11-26 LAB — COMPREHENSIVE METABOLIC PANEL
ALT: 1668 U/L — ABNORMAL HIGH (ref 0–44)
AST: 2103 U/L — ABNORMAL HIGH (ref 15–41)
Albumin: 2.8 g/dL — ABNORMAL LOW (ref 3.5–5.0)
Alkaline Phosphatase: 443 U/L — ABNORMAL HIGH (ref 38–126)
Anion gap: 12 (ref 5–15)
BUN: 36 mg/dL — ABNORMAL HIGH (ref 6–20)
CO2: 20 mmol/L — ABNORMAL LOW (ref 22–32)
Calcium: 8.7 mg/dL — ABNORMAL LOW (ref 8.9–10.3)
Chloride: 104 mmol/L (ref 98–111)
Creatinine, Ser: 1.79 mg/dL — ABNORMAL HIGH (ref 0.44–1.00)
GFR calc Af Amer: 36 mL/min — ABNORMAL LOW (ref >60)
GFR calc non Af Amer: 31 mL/min — ABNORMAL LOW (ref >60)
Glucose, Bld: 90 mg/dL (ref 70–99)
Potassium: 4 mmol/L (ref 3.5–5.1)
Sodium: 136 mmol/L (ref 135–145)
Total Bilirubin: 2.4 mg/dL — ABNORMAL HIGH (ref 0.3–1.2)
Total Protein: 6.2 g/dL — ABNORMAL LOW (ref 6.5–8.1)

## 2019-11-26 LAB — CBC
HCT: 28.4 % — ABNORMAL LOW (ref 36.0–46.0)
HCT: 26.3 % — ABNORMAL LOW (ref 36.0–46.0)
HCT: 27.8 % — ABNORMAL LOW (ref 36.0–46.0)
Hemoglobin: 8.9 g/dL — ABNORMAL LOW (ref 12.0–15.0)
Hemoglobin: 8.4 g/dL — ABNORMAL LOW (ref 12.0–15.0)
Hemoglobin: 8.7 g/dL — ABNORMAL LOW (ref 12.0–15.0)
MCH: 29.2 pg (ref 26.0–34.0)
MCH: 30.1 pg (ref 26.0–34.0)
MCH: 29.9 pg (ref 26.0–34.0)
MCHC: 31.3 g/dL (ref 30.0–36.0)
MCHC: 31.9 g/dL (ref 30.0–36.0)
MCHC: 31.3 g/dL (ref 30.0–36.0)
MCV: 93.1 fL (ref 80.0–100.0)
MCV: 94.3 fL (ref 80.0–100.0)
MCV: 95.5 fL (ref 80.0–100.0)
Platelets: 130 10*3/uL — ABNORMAL LOW (ref 150–400)
Platelets: 117 10*3/uL — ABNORMAL LOW (ref 150–400)
Platelets: 122 10*3/uL — ABNORMAL LOW (ref 150–400)
RBC: 3.05 MIL/uL — ABNORMAL LOW (ref 3.87–5.11)
RBC: 2.79 MIL/uL — ABNORMAL LOW (ref 3.87–5.11)
RBC: 2.91 MIL/uL — ABNORMAL LOW (ref 3.87–5.11)
RDW: 14.8 % (ref 11.5–15.5)
RDW: 14.9 % (ref 11.5–15.5)
RDW: 15.1 % (ref 11.5–15.5)
WBC: 6.2 10*3/uL (ref 4.0–10.5)
WBC: 7.6 10*3/uL (ref 4.0–10.5)
WBC: 16.8 10*3/uL — ABNORMAL HIGH (ref 4.0–10.5)
nRBC: 0 % (ref 0.0–0.2)
nRBC: 0 % (ref 0.0–0.2)
nRBC: 0 % (ref 0.0–0.2)

## 2019-11-26 LAB — C-REACTIVE PROTEIN: CRP: 1.7 mg/dL — ABNORMAL HIGH (ref <1.0)

## 2019-11-26 LAB — URINE CULTURE: Culture: <10000 — AB

## 2019-11-26 LAB — LACTIC ACID, PLASMA: Lactic Acid, Venous: 2.6 mmol/L (ref 0.5–1.9)

## 2019-11-26 LAB — BRAIN NATRIURETIC PEPTIDE: B Natriuretic Peptide: 179.1 pg/mL — ABNORMAL HIGH (ref 0.0–100.0)

## 2019-11-26 LAB — CREATININE, SERUM
Creatinine, Ser: 1.41 mg/dL — ABNORMAL HIGH (ref 0.44–1.00)
GFR calc Af Amer: 47 mL/min — ABNORMAL LOW (ref >60)
GFR calc non Af Amer: 41 mL/min — ABNORMAL LOW (ref >60)

## 2019-11-26 SURGERY — ERCP, WITH INTERVENTION IF INDICATED
Anesthesia: Choice

## 2019-11-26 SURGERY — ERCP, WITH INTERVENTION IF INDICATED
Anesthesia: General

## 2019-11-26 MED ORDER — LIDOCAINE 2% (20 MG/ML) 5 ML SYRINGE
INTRAMUSCULAR | Status: AC
  Filled 2019-11-26: qty 5

## 2019-11-26 MED ORDER — DULOXETINE HCL 30 MG PO CPEP
90.0000 mg | ORAL_CAPSULE | Freq: Every day | ORAL | Status: DC
  Administered 2019-11-27 – 2019-11-30 (×4): 90 mg via ORAL
  Filled 2019-11-26 (×5): qty 3

## 2019-11-26 MED ORDER — ROCURONIUM BROMIDE 10 MG/ML (PF) SYRINGE
PREFILLED_SYRINGE | INTRAVENOUS | Status: AC
  Filled 2019-11-26: qty 10

## 2019-11-26 MED ORDER — SODIUM CHLORIDE 0.9 % IV SOLN: INTRAVENOUS | Status: DC

## 2019-11-26 MED ORDER — ZOLEDRONIC ACID 5 MG/100ML IV SOLN: 5.0000 mg | Freq: Once | INTRAVENOUS | Status: DC

## 2019-11-26 MED ORDER — METOPROLOL TARTRATE 50 MG PO TABS
100.0000 mg | ORAL_TABLET | Freq: Two times a day (BID) | ORAL | Status: DC
  Administered 2019-11-26 – 2019-11-30 (×9): 100 mg via ORAL
  Filled 2019-11-26 (×8): qty 2
  Filled 2019-11-26: qty 4

## 2019-11-26 MED ORDER — CHLORHEXIDINE GLUCONATE CLOTH 2 % EX PADS
6.0000 | MEDICATED_PAD | Freq: Every day | CUTANEOUS | Status: DC
  Administered 2019-11-26 – 2019-11-30 (×5): 6 via TOPICAL

## 2019-11-26 MED ORDER — FENTANYL CITRATE (PF) 250 MCG/5ML IJ SOLN
INTRAMUSCULAR | Status: DC | PRN
  Administered 2019-11-26: 100 ug via INTRAVENOUS

## 2019-11-26 MED ORDER — ONDANSETRON HCL 4 MG/2ML IJ SOLN
INTRAMUSCULAR | Status: AC
  Filled 2019-11-26: qty 2

## 2019-11-26 MED ORDER — ESTRADIOL 1 MG PO TABS
2.0000 mg | ORAL_TABLET | Freq: Every day | ORAL | Status: DC
  Administered 2019-11-27 – 2019-11-30 (×4): 2 mg via ORAL
  Filled 2019-11-26 (×5): qty 2

## 2019-11-26 MED ORDER — SODIUM CHLORIDE 0.9% FLUSH: 10.0000 mL | INTRAVENOUS | Status: DC | PRN

## 2019-11-26 MED ORDER — PHENYLEPHRINE 40 MCG/ML (10ML) SYRINGE FOR IV PUSH (FOR BLOOD PRESSURE SUPPORT)
PREFILLED_SYRINGE | INTRAVENOUS | Status: DC | PRN
  Administered 2019-11-26 (×2): 80 ug via INTRAVENOUS
  Administered 2019-11-26: 120 ug via INTRAVENOUS

## 2019-11-26 MED ORDER — DEXAMETHASONE SODIUM PHOSPHATE 10 MG/ML IJ SOLN
INTRAMUSCULAR | Status: AC
  Filled 2019-11-26: qty 1

## 2019-11-26 MED ORDER — SUCCINYLCHOLINE CHLORIDE 200 MG/10ML IV SOSY
PREFILLED_SYRINGE | INTRAVENOUS | Status: DC | PRN
  Administered 2019-11-26: 50 mg via INTRAVENOUS

## 2019-11-26 MED ORDER — DICYCLOMINE HCL 20 MG PO TABS
20.0000 mg | ORAL_TABLET | Freq: Three times a day (TID) | ORAL | Status: DC
  Administered 2019-11-26 – 2019-11-30 (×11): 20 mg via ORAL
  Filled 2019-11-26 (×12): qty 1

## 2019-11-26 MED ORDER — TRAZODONE HCL 100 MG PO TABS
200.0000 mg | ORAL_TABLET | Freq: Every evening | ORAL | Status: DC | PRN
  Administered 2019-11-26: 200 mg via ORAL
  Filled 2019-11-26: qty 2

## 2019-11-26 MED ORDER — LACTATED RINGERS IV SOLN: INTRAVENOUS | Status: DC | PRN

## 2019-11-26 MED ORDER — PHENYLEPHRINE 40 MCG/ML (10ML) SYRINGE FOR IV PUSH (FOR BLOOD PRESSURE SUPPORT)
PREFILLED_SYRINGE | INTRAVENOUS | Status: AC
  Filled 2019-11-26: qty 10

## 2019-11-26 MED ORDER — BUDESONIDE 3 MG PO CPEP
9.0000 mg | ORAL_CAPSULE | Freq: Every day | ORAL | Status: DC
  Administered 2019-11-27 – 2019-11-30 (×4): 9 mg via ORAL
  Filled 2019-11-26 (×5): qty 3

## 2019-11-26 MED ORDER — LEVOFLOXACIN IN D5W 750 MG/150ML IV SOLN
750.0000 mg | INTRAVENOUS | Status: DC
  Administered 2019-11-26 – 2019-11-28 (×2): 750 mg via INTRAVENOUS
  Filled 2019-11-26 (×3): qty 150

## 2019-11-26 MED ORDER — BUPROPION HCL ER (SR) 100 MG PO TB12
200.0000 mg | ORAL_TABLET | Freq: Every day | ORAL | Status: DC
  Administered 2019-11-27 – 2019-11-30 (×4): 200 mg via ORAL
  Filled 2019-11-26 (×5): qty 2

## 2019-11-26 MED ORDER — INDOMETHACIN 50 MG RE SUPP
RECTAL | Status: AC
  Filled 2019-11-26: qty 2

## 2019-11-26 MED ORDER — DEXAMETHASONE SODIUM PHOSPHATE 10 MG/ML IJ SOLN
INTRAMUSCULAR | Status: DC | PRN
  Administered 2019-11-26: 10 mg via INTRAVENOUS

## 2019-11-26 MED ORDER — HYDROMORPHONE HCL 2 MG PO TABS: 4.0000 mg | ORAL_TABLET | Freq: Four times a day (QID) | ORAL | Status: DC | PRN

## 2019-11-26 MED ORDER — MIDAZOLAM HCL 2 MG/2ML IJ SOLN
INTRAMUSCULAR | Status: AC
  Filled 2019-11-26: qty 2

## 2019-11-26 MED ORDER — POTASSIUM CHLORIDE CRYS ER 20 MEQ PO TBCR
40.0000 meq | EXTENDED_RELEASE_TABLET | Freq: Every day | ORAL | Status: DC
  Administered 2019-11-27 – 2019-11-28 (×2): 40 meq via ORAL
  Filled 2019-11-26 (×2): qty 2

## 2019-11-26 MED ORDER — INDOMETHACIN 50 MG RE SUPP
RECTAL | Status: DC | PRN
  Administered 2019-11-26: 100 mg via RECTAL

## 2019-11-26 MED ORDER — PROPOFOL 10 MG/ML IV BOLUS
INTRAVENOUS | Status: DC | PRN
  Administered 2019-11-26: 150 mg via INTRAVENOUS

## 2019-11-26 MED ORDER — SODIUM CHLORIDE 0.9 % IV SOLN
INTRAVENOUS | Status: AC | PRN
  Administered 2019-11-26: 1000 mL via INTRAMUSCULAR

## 2019-11-26 MED ORDER — ROCURONIUM BROMIDE 10 MG/ML (PF) SYRINGE
PREFILLED_SYRINGE | INTRAVENOUS | Status: DC | PRN
  Administered 2019-11-26: 50 mg via INTRAVENOUS

## 2019-11-26 MED ORDER — SUCCINYLCHOLINE CHLORIDE 200 MG/10ML IV SOSY
PREFILLED_SYRINGE | INTRAVENOUS | Status: AC
  Filled 2019-11-26: qty 10

## 2019-11-26 MED ORDER — PANTOPRAZOLE SODIUM 40 MG PO TBEC
40.0000 mg | DELAYED_RELEASE_TABLET | Freq: Every day | ORAL | Status: DC
  Administered 2019-11-27 – 2019-11-30 (×4): 40 mg via ORAL
  Filled 2019-11-26 (×5): qty 1

## 2019-11-26 MED ORDER — GLUCAGON HCL RDNA (DIAGNOSTIC) 1 MG IJ SOLR
INTRAMUSCULAR | Status: DC | PRN
  Administered 2019-11-26 (×2): .2 mg via INTRAVENOUS

## 2019-11-26 MED ORDER — PROPOFOL 10 MG/ML IV BOLUS
INTRAVENOUS | Status: AC
  Filled 2019-11-26: qty 20

## 2019-11-26 MED ORDER — FENTANYL CITRATE (PF) 100 MCG/2ML IJ SOLN
INTRAMUSCULAR | Status: AC
  Filled 2019-11-26: qty 2

## 2019-11-26 MED ORDER — MIDAZOLAM HCL 5 MG/5ML IJ SOLN
INTRAMUSCULAR | Status: DC | PRN
  Administered 2019-11-26: 2 mg via INTRAVENOUS

## 2019-11-26 MED ORDER — ALPRAZOLAM 0.5 MG PO TABS: 0.5000 mg | ORAL_TABLET | Freq: Once | ORAL | Status: DC

## 2019-11-26 MED ORDER — FENTANYL CITRATE (PF) 250 MCG/5ML IJ SOLN
INTRAMUSCULAR | Status: AC
  Filled 2019-11-26: qty 5

## 2019-11-26 MED ORDER — GLUCAGON HCL RDNA (DIAGNOSTIC) 1 MG IJ SOLR
INTRAMUSCULAR | Status: AC
  Filled 2019-11-26: qty 2

## 2019-11-26 MED ORDER — LIDOCAINE 2% (20 MG/ML) 5 ML SYRINGE
INTRAMUSCULAR | Status: DC | PRN
  Administered 2019-11-26: 60 mg via INTRAVENOUS

## 2019-11-26 MED ORDER — METHADONE HCL 10 MG PO TABS
20.0000 mg | ORAL_TABLET | Freq: Two times a day (BID) | ORAL | Status: DC
  Administered 2019-11-26 – 2019-11-30 (×8): 20 mg via ORAL
  Filled 2019-11-26 (×8): qty 2

## 2019-11-26 NOTE — Progress Notes (Signed)
Pharmacy Antibiotic Note  Caitlin Peters is a 58 y.o. female admitted on 11/25/2019 with Intra-abdominal infection.  Pharmacy has been consulted for Ciprofloxacin dosing.  Plan: Ciprofloxacin 487m iv q12hr  Height: 5' 8"  (172.7 cm) Weight: 124 lb (56.2 kg) IBW/kg (Calculated) : 63.9  Temp (24hrs), Avg:100.6 F (38.1 C), Min:97.8 F (36.6 C), Max:102.6 F (39.2 C)  Recent Labs  Lab 11/25/19 1642 11/25/19 2050 11/25/19 2335 11/26/19 0209  WBC 4.4  --  6.2 7.6  CREATININE 1.37*  --  1.41*  --   LATICACIDVEN 0.5 2.1*  --  2.6*    Estimated Creatinine Clearance: 38.6 mL/min (A) (by C-G formula based on SCr of 1.41 mg/dL (H)).    Allergies  Allergen Reactions  . Cefepime Other (See Comments)    Neurotoxicity occurring in setting of AKI. Ceftriaxone tolerated during same admit  . Gabapentin Other (See Comments)    unknown  . Hyoscyamine Hives and Swelling    Legs swelling   Disorientation  . Lyrica [Pregabalin] Other (See Comments)    unknown  . Meperidine Hives    Other reaction(s): GI Upset Due to [place]   . Topamax [Topiramate] Other (See Comments)    unknown  . Fentanyl Rash  . Morphine And Related Rash    Antimicrobials this admission: Ciprofloxacin 11/25/2019 >> Flagyl 11/25/2019 >>   Dose adjustments this admission: -  Microbiology results: -  Thank you for allowing pharmacy to be a part of this patient's care.  GNani SkillernCrowford 11/26/2019 4:25 AM

## 2019-11-26 NOTE — Transfer of Care (Signed)
Immediate Anesthesia Transfer of Care Note  Patient: Caitlin Peters  Procedure(s) Performed: ENDOSCOPIC RETROGRADE CHOLANGIOPANCREATOGRAPHY (ERCP) (N/A ) REMOVAL OF STONES SPHINCTEROTOMY BILIARY DILATION  Patient Location: PACU  Anesthesia Type:General  Level of Consciousness: awake and patient cooperative  Airway & Oxygen Therapy: Patient Spontanous Breathing and Patient connected to face mask  Post-op Assessment: Report given to RN and Post -op Vital signs reviewed and stable  Post vital signs: Reviewed and stable  Last Vitals:  Vitals Value Taken Time  BP    Temp    Pulse    Resp    SpO2      Last Pain:  Vitals:   11/26/19 1418  TempSrc: Oral  PainSc: 8       Patients Stated Pain Goal: 2 (79/81/02 5486)  Complications: No apparent anesthesia complications

## 2019-11-26 NOTE — Anesthesia Postprocedure Evaluation (Signed)
Anesthesia Post Note  Patient: Caitlin Peters  Procedure(s) Performed: ENDOSCOPIC RETROGRADE CHOLANGIOPANCREATOGRAPHY (ERCP) (N/A ) REMOVAL OF STONES SPHINCTEROTOMY BILIARY DILATION     Patient location during evaluation: PACU Anesthesia Type: General Level of consciousness: sedated Pain management: pain level controlled Vital Signs Assessment: post-procedure vital signs reviewed and stable Respiratory status: spontaneous breathing and respiratory function stable Cardiovascular status: stable Postop Assessment: no apparent nausea or vomiting Anesthetic complications: no    Last Vitals:  Vitals:   11/26/19 1658 11/26/19 1721  BP: 140/70 (!) 153/69  Pulse: 81 76  Resp: 14 16  Temp:  36.6 C  SpO2: 97% 100%           Caitlin Peters

## 2019-11-26 NOTE — Consult Note (Addendum)
Referring Provider:  EDP Primary Care Physician:  [name], [name], MD Primary Gastroenterologist:  Dr. Tarri Glenn  Reason for Consultation:  Crohn's disease  HPI: Caitlin Peters is a 58 y.o. female who recently relocated to [place] from [place].  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  She had been followed by the [facility] clinic since 2012 for management of malabsorption.  She has required 7 prior surgeries and multiple complications, once again resulting in short gut syndrome requiring TPN since 2003.  She has chronic diarrhea, malabsorption, iron deficiency anemia requiring IV iron infusions, and severe protein-caloric malnutrition.  She has required multiple hospitalizations for sepsis and line infections.  Just prior to moving to [place] she had a left femoral tunneled PICC placed.  She has frequent electrolyte deficiencies including hypomagnesemia and hypokalemia related to her diarrhea.  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  She has chronic pain related to Crohn's disease and was being managed by pain clinic on methadone and hydromorphone, but she has had difficulty finding pain management since moving to [place].  Was diagnosed with Crohn's at age 84.  She had also been followed by the small bowel transplant clinic at [facility] clinic prior to moving here.  She underwent full evaluation for intestinal transplant, however, she was not ultimately listed due to compliance and inability to identify a caregiver.  Her daughter is a Marine scientist in [place] but is very busy both personally and professionally with young children.  They recently relocated her here so that they could help with her medical illnesses.      IBD history:  Surgical history: Colon resection with abdominal stoma May 2000, repair of intestinal leak June 2000, reversal of abdominal stoma March 2001, small bowel resection 2002, small bowel resection 2003, small bowel resection for obstruction 2009, ex lap/lysis of adhesions/tap procedure/open cholecystectomy/omentectomy 01/22/2018. Short gut syndrome since 2003 when she started TPN.  Surgical records from the [facility] clinic note approximately 120 cm of small bowel attached to her proximal transverse colon through the rectum.  She has had PICC line catheters off and on since 2000.  Ileocolonic anastomosis stricture has been dilated in the past. Current medications: Budesonide 9 mg daily, dicyclomine 20 mg 3 times daily, Imodium as needed, Creon Prior medications:  "I can't remember all of that stuff. They have given me so many drugs." Pentasa, Asacol, long-term steroids, probiotics, Lomotil, Benefiber, Flagyl, ciprofloxacin, cholestyramine. Not previously on biologics.  She tells me that steroids eventually just stopped working. Last colonoscopy: 06/09/2018 at the [facility] with Dr. Marcy Siren.  Patent side-to-side ileocolonic anastomosis.  Healthy-appearing mucosa.  Small bowel mucosa with no diagnostic alteration.  Colonic mucosa with no diagnostic alteration.  No evidence of granulomas or dysplasia. Fecal calprotectin: 52.7 09/16/18 Last EGD: 05/20/2017 with Dr. Salomon Mast was normal.  Biopsies showed no evidence of Crohn's disease. Most recent abdominal imaging:  - CT abd/pelvis with IV contrast 09/09/19 in [place] showed no acute process, possible constipation, numerous surgical clips from prior bowel surgeries, mild biliary dilatation related to prior cholecystectomy - CT abd/pelvis without contrast 10/12/19: Extensive postoperative changes with no bowel inflammation evident: Surgically absent gallbladder with intra and extra biliary ductal enlargement Extraintestinal manifestations:  Right knee, iritis of the right eye, uveitis Family history of  IBD: Paternal first cousin with Crohn's x 2 Comorbidities: History of multiple line infections, frequent readmission for dehydration and electrolyte abnormalities, narcotic dependency, home TPN since 2003, osteoporosis, stenosed upper veins, and history of noncompliance to PN infusion per pump report in 2017, C. Difficile.  All of this information was obtained from Dr. Tarri Glenn office note from yesterday, December 30, as it was the first time that she had seen the patient to establish care.  She extensively reviewed care everywhere records and obtained the information from both the patient and her daughter who accompanied to her visit.  Nonetheless, patient ended up being referred to the hospital/ER due to the fact that she had not eaten in several days, appeared dehydrated, not able to manage pain, having ongoing diarrhea, complaints of abdominal pain.  Upon evaluation in the emergency department a CT scan of the abdomen and pelvis was performed with contrast that showed the following:  IMPRESSION: 1. Descending colon and sigmoid colon bowel wall thickening as well as rectal wall thickening as can be seen with proctocolitis secondary to an inflammatory etiology given the patient's history of Crohn's disease.  Also, of note, CBD is 12 mm.  Labs initially showed a normal white blood cell count, but this morning it was elevated at 16.8.  She was afebrile up to 102.6 degrees overnight.  LFTs were normal yesterday on admission except for mildly elevated alk phos at 167.  Today, however, they are significantly elevated with an AST of 2103, ALT 1668, alk phos 443, total bilirubin 2.4.  Sodium and potassium levels appear normal.  Lactic acid elevated.  C. difficile negative.  Sed rate elevated at 35, CRP elevated at 1.0 yesterday, but 1.7 today.  GI pathogen panel is pending.  Phosphorus and magnesium levels have not been checked here, but were normal  earlier this month.  Total protein is low at 6.2, albumin low at 2.8.  She has been started on IV Cipro and Flagyl.  Past Medical History:  Diagnosis Date  . Anasarca 10/2019  . AVN (avascular necrosis of bone) ([place])   . Cataract   . Chronic pain syndrome   . CKD (chronic kidney disease), stage III   . Crohn disease ([place])   . Crohn disease ([place])   . Depression   . Diverticulosis   . GERD (gastroesophageal reflux disease)   . HTN (hypertension)   . IDA (iron deficiency anemia)   . Malnutrition ([place])   . Mass in chest   . Osteoporosis   . Pancreatitis   . Short gut syndrome   . Vitamin B12 deficiency     Past Surgical History:  Procedure Laterality Date  . ABDOMINAL HYSTERECTOMY    . ABDOMINAL SURGERY     colon resection with abdominal stoma, repair of intestinal leak, reversal of abdominal stoma  . CHEST WALL RESECTION     right thoracotomy,resection of chest mass with anterior rib and reconstruction using prosthetic mesh and video arthroscopy  . CHOLECYSTECTOMY    . COLONOSCOPY  2019  . IR US GUIDE VASC ACCESS LEFT     x 2 06/17/19 and 09/14/2019  . KNEE SURGERY     right knee   . SMALL INTESTINE SURGERY     x3  . UPPER GASTROINTESTINAL ENDOSCOPY      Prior to Admission medications   Medication Sig Start Date End Date Taking? Authorizing Provider  acetaminophen (TYLENOL) 325 MG tablet Take 650 mg by mouth every 6 (six) hours as needed for mild pain.  Yes [provider]  amLODipine (NORVASC) 5 MG tablet Take 1 tablet (5 mg total) by mouth daily. Patient taking differently: Take 10 mg by mouth daily.  10/16/19  Yes [name], [name], MD  budesonide (ENTOCORT EC) 3 MG 24 hr capsule Take 3 capsules (9 mg total) by mouth daily. 11/01/19  Yes Dessa Phi, DO  buPROPion [facility] SR) 100 MG 12 hr tablet Take 200 mg by mouth daily.    Yes [provider]  calcium carbonate (OS-CAL) 1250 (500 Ca) MG chewable tablet Chew 1 tablet by mouth 2  (two) times daily.   Yes [provider]  cholecalciferol (VITAMIN D3) 25 MCG (1000 UT) tablet Take 2,000 Units by mouth daily.   Yes [provider]  cycloSPORINE (RESTASIS) 0.05 % ophthalmic emulsion Place 1 drop into both eyes 2 (two) times daily.   Yes [provider]  dexlansoprazole (DEXILANT) 60 MG capsule Take 1 capsule (60 mg total) by mouth daily. 10/16/19  Yes [name], [name], MD  Dextran 70-Hypromellose 0.1-0.3 % SOLN Place 1 drop into both eyes 4 (four) times daily.   Yes [provider]  dicyclomine (BENTYL) 20 MG tablet Take 20 mg by mouth 3 (three) times daily. 10/26/19  Yes [provider]  diphenoxylate-atropine (LOMOTIL) 2.5-0.025 MG tablet Take 1 tablet by mouth 4 (four) times daily as needed for diarrhea or loose stools. 10/31/19  Yes Dessa Phi, DO  DULoxetine (CYMBALTA) 30 MG capsule Take 3 capsules (90 mg total) by mouth daily. 11/01/19  Yes Dessa Phi, DO  estradiol (ESTRACE) 2 MG tablet Take 2 mg by mouth daily.   Yes [provider]  HYDROmorphone (DILAUDID) 4 MG tablet Take 4 mg by mouth every 6 (six) hours as needed for moderate pain or severe pain.   Yes [provider]  lipase/protease/amylase (CREON) 36000 UNITS CPEP capsule Take 1 capsule (36,000 Units total) by mouth 3 (three) times daily before meals. Patient taking differently: Take 108,000 Units by mouth 3 (three) times daily before meals.  10/31/19  Yes Dessa Phi, DO  loperamide (IMODIUM) 2 MG capsule Take 1 capsule (2 mg total) by mouth as needed for diarrhea or loose stools. 10/16/19  Yes [name], [name], MD  methadone (DOLOPHINE) 10 MG tablet Take 20 mg by mouth every 12 (twelve) hours.   Yes [provider]  metoprolol tartrate (LOPRESSOR) 100 MG tablet Take 100 mg by mouth 2 (two) times daily.   Yes [provider]  Multiple Vitamins-Minerals (MULTIVITAMIN ADULT PO) Take 1 tablet by mouth daily.    Yes [provider]  potassium chloride SA (KLOR-CON) 20 MEQ tablet Take 2 tablets (40 mEq total) by mouth daily. 10/16/19  Yes [name], [name], MD  Probiotic Product (PROBIOTIC-10 PO) Take 1 capsule by mouth daily.   Yes [provider]  promethazine (PHENERGAN) 25 MG tablet Take 1 tablet (25 mg total) by mouth every 6 (six) hours as needed for nausea or vomiting. 11/19/19  Yes [name], [name], MD  sucralfate (CARAFATE) 1 g tablet Take 1 tablet (1 g total) by mouth 4 (four) times daily -  before meals and at bedtime. 10/31/19  Yes Dessa Phi, DO  traZODone (DESYREL) 100 MG tablet Take 2 tablets (200 mg total) by mouth at bedtime as needed for sleep. 10/31/19  Yes Dessa Phi, DO  vitamin B-12 (CYANOCOBALAMIN) 100 MCG tablet Take 100 mcg by mouth daily.   Yes [provider]  zoledronic  acid (RECLAST) 5 MG/100ML SOLN injection Inject 5 mg into the vein once.   Yes [provider]    Current Facility-Administered Medications  Medication Dose Route Frequency Provider Last Rate Last Admin  . 0.9 %  sodium chloride infusion   Intravenous Continuous Bunnie Pion Z, DO 100 mL/hr at 11/25/19 2327 New Bag at 11/25/19 2327  . acetaminophen (TYLENOL) tablet 650 mg  650 mg Oral Q6H PRN Bunnie Pion Z, DO   650 mg at 11/26/19 3734   Or  . acetaminophen (TYLENOL) suppository 650 mg  650 mg Rectal Q6H PRN Bunnie Pion Z, DO      . Chlorhexidine Gluconate Cloth 2 % PADS 6 each  6 each Topical Daily Bunnie Pion Z, DO      . ciprofloxacin (CIPRO) IVPB 400 mg  400 mg Intravenous BID Bunnie Pion Z, DO 200 mL/hr at 11/26/19 0046 400 mg at 11/26/19 0046  . heparin injection 5,000 Units  5,000 Units Subcutaneous Q8H Bunnie Pion Z, DO   5,000 Units at 11/26/19 2876  . HYDROmorphone (DILAUDID) injection 1 mg  1 mg Intravenous Q4H PRN Bunnie Pion Z, DO   1 mg at 11/26/19 0845  . metroNIDAZOLE (FLAGYL) IVPB 500 mg  500 mg Intravenous Q8H  Bunnie Pion Z, DO 100 mL/hr at 11/26/19 0510 500 mg at 11/26/19 0510  . ondansetron (ZOFRAN) tablet 4 mg  4 mg Oral Q6H PRN Bunnie Pion Z, DO       Or  . ondansetron [facility]) injection 4 mg  4 mg Intravenous Q6H PRN Bunnie Pion Z, DO      . prochlorperazine (COMPAZINE) injection 10 mg  10 mg Intravenous Q6H PRN Bunnie Pion Z, DO   10 mg at 11/26/19 0445  . promethazine (PHENERGAN) injection 25 mg  25 mg Intravenous Q6H PRN Bunnie Pion Z, DO   25 mg at 11/26/19 8115  . sodium chloride flush (NS) 0.9 % injection 10-40 mL  10-40 mL Intracatheter PRN Bunnie Pion Z, DO        Allergies as of 11/25/2019 - Review Complete 11/25/2019  Allergen Reaction Noted  . Cefepime Other (See Comments) 11/27/2017  . Gabapentin Other (See Comments) 10/13/2019  . Hyoscyamine Hives and Swelling 07/15/2014  . Lyrica [pregabalin] Other (See Comments) 10/13/2019  . Meperidine Hives 08/14/2011  . Topamax [topiramate] Other (See Comments) 10/13/2019  . Fentanyl Rash 10/12/2019  . Morphine and related Rash 10/12/2019    Family History  Problem Relation Age of Onset  . Breast cancer Sister   . Multiple sclerosis Sister   . Diabetes Sister   . Lupus Sister   . Colon cancer Other   . Crohn's disease Other   . Seizures Mother   . Glaucoma Mother   . CAD Father   . Heart disease Father   . Hypertension Father     Social History   Socioeconomic History  . Marital status: Single    Spouse name: Not on file  . Number of children: Not on file  . Years of education: Not on file  . Highest education level: Not on file  Occupational History  . Not on file  Tobacco Use  . Smoking status: Former Research scientist (life sciences)  . Smokeless tobacco: Never Used  Substance and Sexual Activity  . Alcohol use: Not Currently  . Drug use: Never  . Sexual activity: Not on file  Other Topics Concern  . Not on file  Social History Narrative  . Not on file  Social Determinants of Health   Financial Resource Strain:    . Difficulty of Paying Living Expenses: Not on file  Food Insecurity:   . Worried About Charity fundraiser in the Last Year: Not on file  . Ran Out of Food in the Last Year: Not on file  Transportation Needs:   . Lack of Transportation (Medical): Not on file  . Lack of Transportation (Non-Medical): Not on file  Physical Activity:   . Days of Exercise per Week: Not on file  . Minutes of Exercise per Session: Not on file  Stress:   . Feeling of Stress : Not on file  Social Connections:   . Frequency of Communication with Friends and Family: Not on file  . Frequency of Social Gatherings with Friends and Family: Not on file  . Attends Religious Services: Not on file  . Active Member of Clubs or Organizations: Not on file  . Attends Archivist Meetings: Not on file  . Marital Status: Not on file  Intimate Partner Violence:   . Fear of Current or Ex-Partner: Not on file  . Emotionally Abused: Not on file  . Physically Abused: Not on file  . Sexually Abused: Not on file    Review of Systems: ROS is O/W negative except as mentioned in HPI.  Physical Exam: Vital signs in last 24 hours: Temp:  [97.8 F (36.6 C)-102.6 F (39.2 C)] 99.6 F (37.6 C) (12/31 0804) Pulse Rate:  [51-98] 85 (12/31 0804) Resp:  [18-20] 18 (12/31 0804) BP: (122-174)/(59-89) 122/59 (12/31 0804) SpO2:  [94 %-100 %] 100 % (12/31 0804) Weight:  [56.2 kg-58.1 kg] 56.2 kg (12/30 1157) Last BM Date: 11/26/19 General:  Alert, thin and chronically ill-appearing, pleasant and cooperative in NAD Head:  Normocephalic and atraumatic. Eyes:  Sclera clear, no icterus.  Conjunctiva pink. Ears:  Normal auditory acuity. Mouth:  No deformity or lesions.   Lungs:  Clear throughout to auscultation.  No wheezes, crackles, or rhonchi.  Heart:  Regular rate and rhythm; no murmurs, clicks, rubs, or gallops. Abdomen:  Soft, thin, non-distended.  BS present.  Multiple well-healed scars noted on abdomen.  TTP in  epigastrium/RUQ and lower abdomen. Msk:  Symmetrical without gross deformities. Pulses:  Normal pulses noted. Extremities:  Without clubbing or edema.  Left tunneled femoral PICC noted. Neurologic:  Alert and oriented x 4;  grossly normal neurologically. Skin:  Intact without significant lesions or rashes. Psych:  Alert and cooperative. Normal mood and affect.  Intake/Output from previous day: 12/30 0701 - 12/31 0700 In: 2837.2 [I.V.:459.5; IV Piggyback:2377.7] Out: -   Lab Results: Recent Labs    11/25/19 2335 11/26/19 0209 11/26/19 0630  WBC 6.2 7.6 16.8*  HGB 8.9* 8.4* 8.7*  HCT 28.4* 26.3* 27.8*  PLT 130* 117* 122*   BMET Recent Labs    11/25/19 1642 11/25/19 2335 11/26/19 0630  NA 138  --  136  K 4.0  --  4.0  CL 103  --  104  CO2 26  --  20*  GLUCOSE 98  --  90  BUN 39*  --  36*  CREATININE 1.37* 1.41* 1.79*  CALCIUM 9.4  --  8.7*   LFT Recent Labs    11/26/19 0630  PROT 6.2*  ALBUMIN 2.8*  AST 2,103*  ALT 1,668*  ALKPHOS 443*  BILITOT 2.4*   Studies/Results: CT ABDOMEN PELVIS W CONTRAST  Result Date: 11/25/2019 CLINICAL DATA:  Crohn's exacerbation. Abdominal pain, nausea, vomiting, diarrhea EXAM:  CT ABDOMEN AND PELVIS WITH CONTRAST TECHNIQUE: Multidetector CT imaging of the abdomen and pelvis was performed using the standard protocol following bolus administration of intravenous contrast. CONTRAST:  33m OMNIPAQUE IOHEXOL 300 MG/ML  SOLN COMPARISON:  10/12/2019 FINDINGS: Lower chest: No acute abnormality. Hepatobiliary: Normal liver. Gallbladder is surgically absent. Intrahepatic and extrahepatic biliary ductal dilatation unchanged compared with the prior examination with the common bile duct measuring 12 mm unchanged from the prior exam. Pancreas: Unremarkable. No pancreatic ductal dilatation or surrounding inflammatory changes. Spleen: Normal in size without focal abnormality. Adrenals/Urinary Tract: Adrenal glands are unremarkable. Kidneys are normal,  without renal calculi, focal lesion, or hydronephrosis. Bladder is unremarkable. Stomach/Bowel: Stomach is normal. Descending colon and sigmoid colon bowel wall thickening as well as rectal wall thickening as can be seen with proctocolitis secondary to an inflammatory etiology given the patient's history of Crohn's disease. Prior right hemicolectomy with small bowel anastomosis to the transverse colon. No pneumatosis, pneumoperitoneum or portal venous gas. Vascular/Lymphatic: Normal caliber abdominal aorta with mild atherosclerosis. Left femoral vein central venous catheter with the tip in the IVC. No lymphadenopathy. Reproductive: Status post hysterectomy. No adnexal masses. Other: No abdominal wall hernia or abnormality. No abdominopelvic ascites. Musculoskeletal: No acute osseous abnormality. No aggressive osseous lesion. IMPRESSION: 1. Descending colon and sigmoid colon bowel wall thickening as well as rectal wall thickening as can be seen with proctocolitis secondary to an inflammatory etiology given the patient's history of Crohn's disease. 2.  Aortic Atherosclerosis (ICD10-I70.0). Electronically Signed   By: HKathreen Devoid  On: 11/25/2019 18:57   DG Hip Port ULoraneW or WTexasPelvis 1 View Left  Result Date: 11/25/2019 CLINICAL DATA:  PICC line placement EXAM: DG HIP (WITH OR WITHOUT PELVIS) 1V PORT LEFT COMPARISON:  October 12, 2019. FINDINGS: There is a central venous catheter projecting over the patient's left pelvis. The tip of this catheter is not visualized on this exam. Exact positioning cannot be determined on this study. There is a sclerotic area within the left iliac bone, stable from prior study. IMPRESSION: PICC line not fully visualized on this exam. Exact positioning cannot be determined. Electronically Signed   By: CConstance HolsterM.D.   On: 11/25/2019 16:14    IMPRESSION:  *59year old female with complicated longstanding history of Crohn's disease and associated short gut syndrome  after multiple surgeries.  [facility] [place]  CT scan in the emergency department shows findings of colitis.  C. difficile negative.  GI pathogen panel pending.  At this point she has been placed on both IV Cipro and Flagyl.  Has never been on biologics. *More acutely she has developed a significant elevation in her LFTs just overnight.  She was febrile, has developed a leukocytosis, and has right upper quadrant pain and tenderness on exam.  Concern for cholangitis.  Initially we had plan for discussed MRCP, but after discussion with ERCP endoscopist we have decided to proceed with ERCP today instead. *Protein calorie malnutrition with frequent electrolyte disturbances: Fortunately electrolytes look okay here.  PLAN: *ERCP later today with Dr. GLyndel Safe *Continue IV Cipro and Flagyl. *Trend LFTs, CBC, electrolyte. *Further recommendations regarding findings of colitis pending results of ERCP. *Ultimately it looks like the [facility] to establish care with an IBD clinic and/or go to rehabilitation clinic/small bowel transplant center.   JLaban Emperor Zehr  11/26/2019, 9:05 AM    Attending physician's note   I  have taken a history, examined the patient and reviewed the chart. I agree with the Advanced Practitioner's note, impression and recommendations.  58 year old female with multiple comorbidities, history of Crohn's disease s/p multiple surgeries, short gut syndrome on chronic TPN through PICC line. Patient states she has been off TPN for 4 months though per Dr. Tarri Glenn notes she had TPN infusing during office visit  Status post cholecystectomy with dilated CBD 12 mm, with fever, leukocytosis and acute elevation in total bili, alk phos, AST and ALT concerning for ascending cholangitis  She has chronic diarrhea, CT abdomen pelvis showed sigmoid and descending colon wall thickening with no  pneumatosis or mesenteric stranding.  She is currently not on any maintenance therapy for Crohn's C. difficile negative, follow-up GI pathogen panel  Plan for emergent ERCP this afternoon by Dr. Lyndel Safe for possible ascending cholangitis We will switch to IV levofloxacin and Flagyl  Will hold off starting steroids for Crohn's given acute infection  Raliegh Ip Denzil Magnuson , MD [phone]

## 2019-11-26 NOTE — H&P (View-Only) (Signed)
 Referring Provider:  EDP Primary Care Physician:  Hernandez Acosta, Estela Y, MD Primary Gastroenterologist:  Dr. Beavers  Reason for Consultation:  Crohn's disease  HPI: Caitlin Peters is a 58 y.o. female who recently relocated to [place] from [place].  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  She had been followed by the [facility] since 2012 for management of malabsorption.  She has required 7 prior surgeries and multiple complications, once again resulting in short gut syndrome requiring TPN since 2003.  She has chronic diarrhea, malabsorption, iron deficiency anemia requiring IV iron infusions, and severe protein-caloric malnutrition.  She has required multiple hospitalizations for sepsis and line infections.  Just prior to moving to  she had a left femoral tunneled PICC placed.  She has frequent electrolyte deficiencies including hypomagnesemia and hypokalemia related to her diarrhea.  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  She has chronic pain related to Crohn's disease and was being managed by pain clinic on methadone and hydromorphone, but she has had difficulty finding pain management since moving to [place].  Was diagnosed with Crohn's at age 22.  She had also been followed by the small bowel transplant clinic at [facility] prior to moving here.  She underwent full evaluation for intestinal transplant, however, she was not ultimately listed due to compliance and inability to identify a caregiver.  Her daughter is a nurse in [place] but is very busy both personally and professionally with young children.  They recently relocated her here so that they could help with her medical illnesses.      IBD history:  Surgical history: Colon resection with abdominal stoma May 2000, repair of intestinal leak June 2000, reversal of abdominal stoma March 2001, small bowel resection 2002, small bowel resection 2003, small bowel resection for obstruction 2009, ex lap/lysis of adhesions/tap procedure/open cholecystectomy/omentectomy 01/22/2018. Short gut syndrome since 2003 when she started TPN.  Surgical records from the [facility] note approximately 120 cm of small bowel attached to her proximal transverse colon through the rectum.  She has had PICC line catheters off and on since 2000.  Ileocolonic anastomosis stricture has been dilated in the past. Current medications: Budesonide 9 mg daily, dicyclomine 20 mg 3 times daily, Imodium as needed, Creon Prior medications:  "I can't remember all of that stuff. They have given me so many drugs." Pentasa, Asacol, long-term steroids, probiotics, Lomotil, Benefiber, Flagyl, ciprofloxacin, cholestyramine. Not previously on biologics.  She tells me that steroids eventually just stopped working. Last colonoscopy: 06/09/2018 at the [facility] with Dr. Miguel Regueiro.  Patent side-to-side ileocolonic anastomosis.  Healthy-appearing mucosa.  Small bowel mucosa with no diagnostic alteration.  Colonic mucosa with no diagnostic alteration.  No evidence of granulomas or dysplasia. Fecal calprotectin: 52.7 09/16/18 Last EGD: 05/20/2017 with Dr. Zuccaro was normal.  Biopsies showed no evidence of Crohn's disease. Most recent abdominal imaging:  - CT abd/pelvis with IV contrast 09/09/19 in [place] showed no acute process, possible constipation, numerous surgical clips from prior bowel surgeries, mild biliary dilatation related to prior cholecystectomy - CT abd/pelvis without contrast 10/12/19: Extensive postoperative changes with no bowel inflammation evident: Surgically absent gallbladder with intra and extra biliary ductal enlargement Extraintestinal manifestations:  Right knee, iritis of the right eye, uveitis Family history of   IBD: Paternal first cousin with Crohn's x 2 Comorbidities: History of multiple line infections, frequent readmission for dehydration and electrolyte abnormalities, narcotic dependency, home TPN since 2003, osteoporosis, stenosed upper veins, and history of noncompliance to PN infusion per pump report in 2017, C. Difficile.  All of this information was obtained from Dr. Beavers office note from yesterday, December 30, as it was the first time that she had seen the patient to establish care.  She extensively reviewed care everywhere records and obtained the information from both the patient and her daughter who accompanied to her visit.  Nonetheless, patient ended up being referred to the hospital/ER due to the fact that she had not eaten in several days, appeared dehydrated, not able to manage pain, having ongoing diarrhea, complaints of abdominal pain.  Upon evaluation in the emergency department a CT scan of the abdomen and pelvis was performed with contrast that showed the following:  IMPRESSION: 1. Descending colon and sigmoid colon bowel wall thickening as well as rectal wall thickening as can be seen with proctocolitis secondary to an inflammatory etiology given the patient's history of Crohn's disease.  Also, of note, CBD is 12 mm.  Labs initially showed a normal white blood cell count, but this morning it was elevated at 16.8.  She was afebrile up to 102.6 degrees overnight.  LFTs were normal yesterday on admission except for mildly elevated alk phos at 167.  Today, however, they are significantly elevated with an AST of 2103, ALT 1668, alk phos 443, total bilirubin 2.4.  Sodium and potassium levels appear normal.  Lactic acid elevated.  C. difficile negative.  Sed rate elevated at 35, CRP elevated at 1.0 yesterday, but 1.7 today.  GI pathogen panel is pending.  Phosphorus and magnesium levels have not been checked here, but were normal  earlier this month.  Total protein is low at 6.2, albumin low at 2.8.  She has been started on IV Cipro and Flagyl.  Past Medical History:  Diagnosis Date  . Anasarca 10/2019  . AVN (avascular necrosis of bone) ([name])   . Cataract   . Chronic pain syndrome   . CKD (chronic kidney disease), stage III   . Crohn disease ([name])   . Crohn disease ([name])   . Depression   . Diverticulosis   . GERD (gastroesophageal reflux disease)   . HTN (hypertension)   . IDA (iron deficiency anemia)   . Malnutrition ([name])   . Mass in chest   . Osteoporosis   . Pancreatitis   . Short gut syndrome   . Vitamin B12 deficiency     Past Surgical History:  Procedure Laterality Date  . ABDOMINAL HYSTERECTOMY    . ABDOMINAL SURGERY     colon resection with abdominal stoma, repair of intestinal leak, reversal of abdominal stoma  . CHEST WALL RESECTION     right thoracotomy,resection of chest mass with anterior rib and reconstruction using prosthetic mesh and video arthroscopy  . CHOLECYSTECTOMY    . COLONOSCOPY  2019  . IR US GUIDE VASC ACCESS LEFT     x 2 06/17/19 and 09/14/2019  . KNEE SURGERY     right knee   . SMALL INTESTINE SURGERY     x3  . UPPER GASTROINTESTINAL ENDOSCOPY      Prior to Admission medications   Medication Sig Start Date End Date Taking? Authorizing Provider  acetaminophen (TYLENOL) 325 MG tablet Take 650 mg by mouth every 6 (six) hours as needed for mild pain.      Yes [provider]  amLODipine (NORVASC) 5 MG tablet Take 1 tablet (5 mg total) by mouth daily. Patient taking differently: Take 10 mg by mouth daily.  10/16/19  Yes Hernandez Acosta, Estela Y, MD  budesonide (ENTOCORT EC) 3 MG 24 hr capsule Take 3 capsules (9 mg total) by mouth daily. 11/01/19  Yes Choi, Jennifer, DO  buPROPion (WELLBUTRIN SR) 100 MG 12 hr tablet Take 200 mg by mouth daily.    Yes [provider]  calcium carbonate (OS-CAL) 1250 (500 Ca) MG chewable tablet Chew 1 tablet by mouth 2  (two) times daily.   Yes [provider]  cholecalciferol (VITAMIN D3) 25 MCG (1000 UT) tablet Take 2,000 Units by mouth daily.   Yes [provider]  cycloSPORINE (RESTASIS) 0.05 % ophthalmic emulsion Place 1 drop into both eyes 2 (two) times daily.   Yes [provider]  dexlansoprazole (DEXILANT) 60 MG capsule Take 1 capsule (60 mg total) by mouth daily. 10/16/19  Yes Hernandez Acosta, Estela Y, MD  Dextran 70-Hypromellose 0.1-0.3 % SOLN Place 1 drop into both eyes 4 (four) times daily.   Yes [provider]  dicyclomine (BENTYL) 20 MG tablet Take 20 mg by mouth 3 (three) times daily. 10/26/19  Yes [provider]  diphenoxylate-atropine (LOMOTIL) 2.5-0.025 MG tablet Take 1 tablet by mouth 4 (four) times daily as needed for diarrhea or loose stools. 10/31/19  Yes Choi, Jennifer, DO  DULoxetine (CYMBALTA) 30 MG capsule Take 3 capsules (90 mg total) by mouth daily. 11/01/19  Yes Choi, Jennifer, DO  estradiol (ESTRACE) 2 MG tablet Take 2 mg by mouth daily.   Yes [provider]  HYDROmorphone (DILAUDID) 4 MG tablet Take 4 mg by mouth every 6 (six) hours as needed for moderate pain or severe pain.   Yes [provider]  lipase/protease/amylase (CREON) 36000 UNITS CPEP capsule Take 1 capsule (36,000 Units total) by mouth 3 (three) times daily before meals. Patient taking differently: Take 108,000 Units by mouth 3 (three) times daily before meals.  10/31/19  Yes Choi, Jennifer, DO  loperamide (IMODIUM) 2 MG capsule Take 1 capsule (2 mg total) by mouth as needed for diarrhea or loose stools. 10/16/19  Yes Hernandez Acosta, Estela Y, MD  methadone (DOLOPHINE) 10 MG tablet Take 20 mg by mouth every 12 (twelve) hours.   Yes [provider]  metoprolol tartrate (LOPRESSOR) 100 MG tablet Take 100 mg by mouth 2 (two) times daily.   Yes [provider]  Multiple Vitamins-Minerals (MULTIVITAMIN ADULT PO) Take 1 tablet by mouth daily.    Yes [provider]  potassium chloride SA (KLOR-CON) 20 MEQ tablet Take 2 tablets (40 mEq total) by mouth daily. 10/16/19  Yes Hernandez Acosta, Estela Y, MD  Probiotic Product (PROBIOTIC-10 PO) Take 1 capsule by mouth daily.   Yes [provider]  promethazine (PHENERGAN) 25 MG tablet Take 1 tablet (25 mg total) by mouth every 6 (six) hours as needed for nausea or vomiting. 11/19/19  Yes Hernandez Acosta, Estela Y, MD  sucralfate (CARAFATE) 1 g tablet Take 1 tablet (1 g total) by mouth 4 (four) times daily -  before meals and at bedtime. 10/31/19  Yes Choi, Jennifer, DO  traZODone (DESYREL) 100 MG tablet Take 2 tablets (200 mg total) by mouth at bedtime as needed for sleep. 10/31/19  Yes Choi, Jennifer, DO  vitamin B-12 (CYANOCOBALAMIN) 100 MCG tablet Take 100 mcg by mouth daily.   Yes [provider]  zoledronic   acid (RECLAST) 5 MG/100ML SOLN injection Inject 5 mg into the vein once.   Yes [provider]    Current Facility-Administered Medications  Medication Dose Route Frequency Provider Last Rate Last Admin  . 0.9 %  sodium chloride infusion   Intravenous Continuous Khan, Mohammad Z, DO 100 mL/hr at 11/25/19 2327 New Bag at 11/25/19 2327  . acetaminophen (TYLENOL) tablet 650 mg  650 mg Oral Q6H PRN Khan, Mohammad Z, DO   650 mg at 11/26/19 0834   Or  . acetaminophen (TYLENOL) suppository 650 mg  650 mg Rectal Q6H PRN Khan, Mohammad Z, DO      . Chlorhexidine Gluconate Cloth 2 % PADS 6 each  6 each Topical Daily Khan, Mohammad Z, DO      . ciprofloxacin (CIPRO) IVPB 400 mg  400 mg Intravenous BID Khan, Mohammad Z, DO 200 mL/hr at 11/26/19 0046 400 mg at 11/26/19 0046  . heparin injection 5,000 Units  5,000 Units Subcutaneous Q8H Khan, Mohammad Z, DO   5,000 Units at 11/26/19 0512  . HYDROmorphone (DILAUDID) injection 1 mg  1 mg Intravenous Q4H PRN Khan, Mohammad Z, DO   1 mg at 11/26/19 0845  . metroNIDAZOLE (FLAGYL) IVPB 500 mg  500 mg Intravenous Q8H  Khan, Mohammad Z, DO 100 mL/hr at 11/26/19 0510 500 mg at 11/26/19 0510  . ondansetron (ZOFRAN) tablet 4 mg  4 mg Oral Q6H PRN Khan, Mohammad Z, DO       Or  . ondansetron (ZOFRAN) injection 4 mg  4 mg Intravenous Q6H PRN Khan, Mohammad Z, DO      . prochlorperazine (COMPAZINE) injection 10 mg  10 mg Intravenous Q6H PRN Khan, Mohammad Z, DO   10 mg at 11/26/19 0445  . promethazine (PHENERGAN) injection 25 mg  25 mg Intravenous Q6H PRN Khan, Mohammad Z, DO   25 mg at 11/26/19 0836  . sodium chloride flush (NS) 0.9 % injection 10-40 mL  10-40 mL Intracatheter PRN Khan, Mohammad Z, DO        Allergies as of 11/25/2019 - Review Complete 11/25/2019  Allergen Reaction Noted  . Cefepime Other (See Comments) 11/27/2017  . Gabapentin Other (See Comments) 10/13/2019  . Hyoscyamine Hives and Swelling 07/15/2014  . Lyrica [pregabalin] Other (See Comments) 10/13/2019  . Meperidine Hives 08/14/2011  . Topamax [topiramate] Other (See Comments) 10/13/2019  . Fentanyl Rash 10/12/2019  . Morphine and related Rash 10/12/2019    Family History  Problem Relation Age of Onset  . Breast cancer Sister   . Multiple sclerosis Sister   . Diabetes Sister   . Lupus Sister   . Colon cancer Other   . Crohn's disease Other   . Seizures Mother   . Glaucoma Mother   . CAD Father   . Heart disease Father   . Hypertension Father     Social History   Socioeconomic History  . Marital status: Single    Spouse name: Not on file  . Number of children: Not on file  . Years of education: Not on file  . Highest education level: Not on file  Occupational History  . Not on file  Tobacco Use  . Smoking status: Former Smoker  . Smokeless tobacco: Never Used  Substance and Sexual Activity  . Alcohol use: Not Currently  . Drug use: Never  . Sexual activity: Not on file  Other Topics Concern  . Not on file  Social History Narrative  . Not on file     Social Determinants of Health   Financial Resource Strain:    . Difficulty of Paying Living Expenses: Not on file  Food Insecurity:   . Worried About Running Out of Food in the Last Year: Not on file  . Ran Out of Food in the Last Year: Not on file  Transportation Needs:   . Lack of Transportation (Medical): Not on file  . Lack of Transportation (Non-Medical): Not on file  Physical Activity:   . Days of Exercise per Week: Not on file  . Minutes of Exercise per Session: Not on file  Stress:   . Feeling of Stress : Not on file  Social Connections:   . Frequency of Communication with Friends and Family: Not on file  . Frequency of Social Gatherings with Friends and Family: Not on file  . Attends Religious Services: Not on file  . Active Member of Clubs or Organizations: Not on file  . Attends Club or Organization Meetings: Not on file  . Marital Status: Not on file  Intimate Partner Violence:   . Fear of Current or Ex-Partner: Not on file  . Emotionally Abused: Not on file  . Physically Abused: Not on file  . Sexually Abused: Not on file    Review of Systems: ROS is O/W negative except as mentioned in HPI.  Physical Exam: Vital signs in last 24 hours: Temp:  [97.8 F (36.6 C)-102.6 F (39.2 C)] 99.6 F (37.6 C) (12/31 0804) Pulse Rate:  [51-98] 85 (12/31 0804) Resp:  [18-20] 18 (12/31 0804) BP: (122-174)/(59-89) 122/59 (12/31 0804) SpO2:  [94 %-100 %] 100 % (12/31 0804) Weight:  [56.2 kg-58.1 kg] 56.2 kg (12/30 1157) Last BM Date: 11/26/19 General:  Alert, thin and chronically ill-appearing, pleasant and cooperative in NAD Head:  Normocephalic and atraumatic. Eyes:  Sclera clear, no icterus.  Conjunctiva pink. Ears:  Normal auditory acuity. Mouth:  No deformity or lesions.   Lungs:  Clear throughout to auscultation.  No wheezes, crackles, or rhonchi.  Heart:  Regular rate and rhythm; no murmurs, clicks, rubs, or gallops. Abdomen:  Soft, thin, non-distended.  BS present.  Multiple well-healed scars noted on abdomen.  TTP in  epigastrium/RUQ and lower abdomen. Msk:  Symmetrical without gross deformities. Pulses:  Normal pulses noted. Extremities:  Without clubbing or edema.  Left tunneled femoral PICC noted. Neurologic:  Alert and oriented x 4;  grossly normal neurologically. Skin:  Intact without significant lesions or rashes. Psych:  Alert and cooperative. Normal mood and affect.  Intake/Output from previous day: 12/30 0701 - 12/31 0700 In: 2837.2 [I.V.:459.5; IV Piggyback:2377.7] Out: -   Lab Results: Recent Labs    11/25/19 2335 11/26/19 0209 11/26/19 0630  WBC 6.2 7.6 16.8*  HGB 8.9* 8.4* 8.7*  HCT 28.4* 26.3* 27.8*  PLT 130* 117* 122*   BMET Recent Labs    11/25/19 1642 11/25/19 2335 11/26/19 0630  NA 138  --  136  K 4.0  --  4.0  CL 103  --  104  CO2 26  --  20*  GLUCOSE 98  --  90  BUN 39*  --  36*  CREATININE 1.37* 1.41* 1.79*  CALCIUM 9.4  --  8.7*   LFT Recent Labs    11/26/19 0630  PROT 6.2*  ALBUMIN 2.8*  AST 2,103*  ALT 1,668*  ALKPHOS 443*  BILITOT 2.4*   Studies/Results: CT ABDOMEN PELVIS W CONTRAST  Result Date: 11/25/2019 CLINICAL DATA:  Crohn's exacerbation. Abdominal pain, nausea, vomiting, diarrhea EXAM:   CT ABDOMEN AND PELVIS WITH CONTRAST TECHNIQUE: Multidetector CT imaging of the abdomen and pelvis was performed using the standard protocol following bolus administration of intravenous contrast. CONTRAST:  80mL OMNIPAQUE IOHEXOL 300 MG/ML  SOLN COMPARISON:  10/12/2019 FINDINGS: Lower chest: No acute abnormality. Hepatobiliary: Normal liver. Gallbladder is surgically absent. Intrahepatic and extrahepatic biliary ductal dilatation unchanged compared with the prior examination with the common bile duct measuring 12 mm unchanged from the prior exam. Pancreas: Unremarkable. No pancreatic ductal dilatation or surrounding inflammatory changes. Spleen: Normal in size without focal abnormality. Adrenals/Urinary Tract: Adrenal glands are unremarkable. Kidneys are normal,  without renal calculi, focal lesion, or hydronephrosis. Bladder is unremarkable. Stomach/Bowel: Stomach is normal. Descending colon and sigmoid colon bowel wall thickening as well as rectal wall thickening as can be seen with proctocolitis secondary to an inflammatory etiology given the patient's history of Crohn's disease. Prior right hemicolectomy with small bowel anastomosis to the transverse colon. No pneumatosis, pneumoperitoneum or portal venous gas. Vascular/Lymphatic: Normal caliber abdominal aorta with mild atherosclerosis. Left femoral vein central venous catheter with the tip in the IVC. No lymphadenopathy. Reproductive: Status post hysterectomy. No adnexal masses. Other: No abdominal wall hernia or abnormality. No abdominopelvic ascites. Musculoskeletal: No acute osseous abnormality. No aggressive osseous lesion. IMPRESSION: 1. Descending colon and sigmoid colon bowel wall thickening as well as rectal wall thickening as can be seen with proctocolitis secondary to an inflammatory etiology given the patient's history of Crohn's disease. 2.  Aortic Atherosclerosis (ICD10-I70.0). Electronically Signed   By: Hetal  Patel   On: 11/25/2019 18:57   DG Hip Port Unilat W or Wo Pelvis 1 View Left  Result Date: 11/25/2019 CLINICAL DATA:  PICC line placement EXAM: DG HIP (WITH OR WITHOUT PELVIS) 1V PORT LEFT COMPARISON:  October 12, 2019. FINDINGS: There is a central venous catheter projecting over the patient's left pelvis. The tip of this catheter is not visualized on this exam. Exact positioning cannot be determined on this study. There is a sclerotic area within the left iliac bone, stable from prior study. IMPRESSION: PICC line not fully visualized on this exam. Exact positioning cannot be determined. Electronically Signed   By: Christopher  Green M.D.   On: 11/25/2019 16:14    IMPRESSION:  *58-year-old female with complicated longstanding history of Crohn's disease and associated short gut syndrome  after multiple surgeries.  Previously followed by the [place] [facility], but recently relocated to [place].  CT scan in the emergency department shows findings of colitis.  C. difficile negative.  GI pathogen panel pending.  At this point she has been placed on both IV Cipro and Flagyl.  Has never been on biologics. *More acutely she has developed a significant elevation in her LFTs just overnight.  She was febrile, has developed a leukocytosis, and has right upper quadrant pain and tenderness on exam.  Concern for cholangitis.  Initially we had plan for discussed MRCP, but after discussion with ERCP endoscopist we have decided to proceed with ERCP today instead. *Protein calorie malnutrition with frequent electrolyte disturbances: Fortunately electrolytes look okay here.  PLAN: *ERCP later today with Dr. Gupta. *Continue IV Cipro and Flagyl. *Trend LFTs, CBC, electrolyte. *Further recommendations regarding findings of colitis pending results of ERCP. *Ultimately it looks like the plan is to refer her to [place]/tertiary care center to establish care with an IBD clinic and/or go to rehabilitation clinic/small bowel transplant center.   Jessica D. Zehr  11/26/2019, 9:05 AM    Attending physician's note   I   have taken a history, examined the patient and reviewed the chart. I agree with the Advanced Practitioner's note, impression and recommendations.  58-year-old female with multiple comorbidities, history of Crohn's disease s/p multiple surgeries, short gut syndrome on chronic TPN through PICC line. Patient states she has been off TPN for 4 months though per Dr. Beavers notes she had TPN infusing during office visit  Status post cholecystectomy with dilated CBD 12 mm, with fever, leukocytosis and acute elevation in total bili, alk phos, AST and ALT concerning for ascending cholangitis  She has chronic diarrhea, CT abdomen pelvis showed sigmoid and descending colon wall thickening with no  pneumatosis or mesenteric stranding.  She is currently not on any maintenance therapy for Crohn's C. difficile negative, follow-up GI pathogen panel  Plan for emergent ERCP this afternoon by Dr. Gupta for possible ascending cholangitis We will switch to IV levofloxacin and Flagyl  Will hold off starting steroids for Crohn's given acute infection  K. Veena Lyrik Buresh , MD [phone]    

## 2019-11-26 NOTE — Progress Notes (Signed)
PHARMACY - PHYSICIAN COMMUNICATION CRITICAL VALUE ALERT - BLOOD CULTURE IDENTIFICATION (BCID)  Caitlin Peters is an 58 y.o. female who presented to [facility] on 11/25/2019 with a chief complaint of  Intractable abdominal pain with nausea vomiting and diarrhea   Name of physician (or Provider) Contacted: Dr. Broadus John  Current antibiotics: cipro and flagyl  Changes to prescribed antibiotics recommended:  Pt allergic to cefepime Stop cipro and start levaquin 728m IV q48h, continue flagyl for proctocolotis  Results for orders placed or performed during the hospital encounter of 11/25/19  Blood Culture ID Panel (Reflexed) (Collected: 11/25/2019  8:50 PM)  Result Value Ref Range   Enterococcus species NOT DETECTED NOT DETECTED   Listeria monocytogenes NOT DETECTED NOT DETECTED   Staphylococcus species NOT DETECTED NOT DETECTED   Staphylococcus aureus (BCID) NOT DETECTED NOT DETECTED   Streptococcus species NOT DETECTED NOT DETECTED   Streptococcus agalactiae NOT DETECTED NOT DETECTED   Streptococcus pneumoniae NOT DETECTED NOT DETECTED   Streptococcus pyogenes NOT DETECTED NOT DETECTED   Acinetobacter baumannii NOT DETECTED NOT DETECTED   Enterobacteriaceae species DETECTED (A) NOT DETECTED   Enterobacter cloacae complex DETECTED (A) NOT DETECTED   Escherichia coli NOT DETECTED NOT DETECTED   Klebsiella oxytoca NOT DETECTED NOT DETECTED   Klebsiella pneumoniae NOT DETECTED NOT DETECTED   Proteus species NOT DETECTED NOT DETECTED   Serratia marcescens NOT DETECTED NOT DETECTED   Carbapenem resistance NOT DETECTED NOT DETECTED   Haemophilus influenzae NOT DETECTED NOT DETECTED   Neisseria meningitidis NOT DETECTED NOT DETECTED   Pseudomonas aeruginosa NOT DETECTED NOT DETECTED   Candida albicans NOT DETECTED NOT DETECTED   Candida glabrata NOT DETECTED NOT DETECTED   Candida krusei NOT DETECTED NOT DETECTED   Candida parapsilosis NOT DETECTED NOT DETECTED   Candida tropicalis NOT  DETECTED NOT DETECTED    EDolly RiasRPh 11/26/2019, 1:05 PM

## 2019-11-26 NOTE — H&P (Signed)
History and Physical    Caitlin Peters ZDG:[id number] DOB: 06-Nov-1961 DOA: 11/25/2019  PCP: [name], [name], MD (Confirm with patient/family/NH records and if not entered, this has to be entered at [facility] point of entry) Patient coming from: Home  I have personally briefly reviewed patient's old medical records in [place]  Chief Complaint: Intractable abdominal pain with nausea vomiting and diarrhea  HPI: Caitlin Peters is a 58 y.o. female with medical history significant of hypertension, Crohn's disease s/p colectomy and short gut syndrome and chronic diarrhea presented to ED for evaluation of worsening abdominal pain along with nausea, vomiting and diarrhea.  Patient states that patient states that she is having severe abdominal pain and left lower quadrant that is colicky in nature, 8 out of 10 on pain scale, nonradiating, getting better and worse with nothing.  Patient states that she is unable to eat anything or drink because of severe nausea and vomiting and she is concerned that she is having Crohn's flare.  Patient further mentioned that she had an appointment with her gastroenterologist today who felt that she should go to the ED.  Patient states that abdominal pain is getting worse but denies fever, chills, chest pain, difficulty with breathing, loss of taste and smell sensation.  Patient also denies any known Covid contacts.  Patient further reported that she is chronically on TPN.  ED Course: On arrival to the ED, patient was very anxious because of pain and her blood pressure was 150/77, heart rate 100, respiratory rate 20 and oxygen saturation 93% on room air.  On labs patient had no leukocytosis but elevated creatinine and alkaline phosphatase.  UA was also negative.  CT abdomen/pelvis showed descending colon, sigmoid colon as well as rectal wall thickening most probably proctocolitis in the setting of Crohn's disease.  Patient was managed with IV Dilaudid, IV  fluids, promethazine the ED.  Review of Systems: As per HPI otherwise 10 point review of systems negative.    Past Medical History:  Diagnosis Date  . Anasarca 10/2019  . AVN (avascular necrosis of bone) ([place])   . Cataract   . Chronic pain syndrome   . CKD (chronic kidney disease), stage III   . Crohn disease ([place])   . Crohn disease ([place])   . Depression   . Diverticulosis   . GERD (gastroesophageal reflux disease)   . HTN (hypertension)   . IDA (iron deficiency anemia)   . Malnutrition ([place])   . Mass in chest   . Osteoporosis   . Pancreatitis   . Short gut syndrome   . Vitamin B12 deficiency     Past Surgical History:  Procedure Laterality Date  . ABDOMINAL HYSTERECTOMY    . ABDOMINAL SURGERY     colon resection with abdominal stoma, repair of intestinal leak, reversal of abdominal stoma  . CHEST WALL RESECTION     right thoracotomy,resection of chest mass with anterior rib and reconstruction using prosthetic mesh and video arthroscopy  . CHOLECYSTECTOMY    . COLONOSCOPY  2019  . IR US GUIDE VASC ACCESS LEFT     x 2 06/17/19 and 09/14/2019  . KNEE SURGERY     right knee   . SMALL INTESTINE SURGERY     x3  . UPPER GASTROINTESTINAL ENDOSCOPY       reports that she has quit smoking. She has never used smokeless tobacco. She reports previous alcohol use. She reports that she does not use drugs.  Allergies  Allergen Reactions  . Cefepime Other (See Comments)    Neurotoxicity occurring in setting of AKI. Ceftriaxone tolerated during same admit  . Gabapentin Other (See Comments)    unknown  . Hyoscyamine Hives and Swelling    Legs swelling   Disorientation  . Lyrica [Pregabalin] Other (See Comments)    unknown  . Meperidine Hives    Other reaction(s): GI Upset Due to [place]   . Topamax [Topiramate] Other (See Comments)    unknown  . Fentanyl Rash  . Morphine And Related Rash    Family History  Problem Relation Age of Onset  . Breast cancer Sister   .  Multiple sclerosis Sister   . Diabetes Sister   . Lupus Sister   . Colon cancer Other   . Crohn's disease Other   . Seizures Mother   . Glaucoma Mother   . CAD Father   . Heart disease Father   . Hypertension Father     Prior to Admission medications   Medication Sig Start Date End Date Taking? Authorizing Provider  acetaminophen (TYLENOL) 325 MG tablet Take 650 mg by mouth every 6 (six) hours as needed for mild pain.    Yes [provider]  amLODipine (NORVASC) 5 MG tablet Take 1 tablet (5 mg total) by mouth daily. Patient taking differently: Take 10 mg by mouth daily.  10/16/19  Yes [name], [name], MD  budesonide (ENTOCORT EC) 3 MG 24 hr capsule Take 3 capsules (9 mg total) by mouth daily. 11/01/19  Yes Dessa Phi, DO  buPROPion [facility] SR) 100 MG 12 hr tablet Take 200 mg by mouth daily.    Yes [provider]  calcium carbonate (OS-CAL) 1250 (500 Ca) MG chewable tablet Chew 1 tablet by mouth 2 (two) times daily.   Yes [provider]  cholecalciferol (VITAMIN D3) 25 MCG (1000 UT) tablet Take 2,000 Units by mouth daily.   Yes [provider]  cycloSPORINE (RESTASIS) 0.05 % ophthalmic emulsion Place 1 drop into both eyes 2 (two) times daily.   Yes [provider]  dexlansoprazole (DEXILANT) 60 MG capsule Take 1 capsule (60 mg total) by mouth daily. 10/16/19  Yes [name], [name], MD  Dextran 70-Hypromellose 0.1-0.3 % SOLN Place 1 drop into both eyes 4 (four) times daily.   Yes [provider]  dicyclomine (BENTYL) 20 MG tablet Take 20 mg by mouth 3 (three) times daily. 10/26/19  Yes [provider]  diphenoxylate-atropine (LOMOTIL) 2.5-0.025 MG tablet Take 1 tablet by mouth 4 (four) times daily as needed for diarrhea or loose stools. 10/31/19  Yes Dessa Phi, DO  DULoxetine (CYMBALTA) 30 MG capsule Take 3 capsules (90 mg total) by mouth daily. 11/01/19  Yes Dessa Phi, DO  estradiol (ESTRACE)  2 MG tablet Take 2 mg by mouth daily.   Yes [provider]  HYDROmorphone (DILAUDID) 4 MG tablet Take 4 mg by mouth every 6 (six) hours as needed for moderate pain or severe pain.   Yes [provider]  lipase/protease/amylase (CREON) 36000 UNITS CPEP capsule Take 1 capsule (36,000 Units total) by mouth 3 (three) times daily before meals. Patient taking differently: Take 108,000 Units by mouth 3 (three) times daily before meals.  10/31/19  Yes Dessa Phi, DO  loperamide (IMODIUM) 2 MG capsule Take 1 capsule (2 mg total) by mouth as needed for diarrhea or loose stools. 10/16/19  Yes Erline Hau, MD  methadone (DOLOPHINE) 10 MG tablet Take 20  mg by mouth every 12 (twelve) hours.   Yes [provider]  metoprolol tartrate (LOPRESSOR) 100 MG tablet Take 100 mg by mouth 2 (two) times daily.   Yes [provider]  Multiple Vitamins-Minerals (MULTIVITAMIN ADULT PO) Take 1 tablet by mouth daily.   Yes [provider]  potassium chloride SA (KLOR-CON) 20 MEQ tablet Take 2 tablets (40 mEq total) by mouth daily. 10/16/19  Yes [name], [name], MD  Probiotic Product (PROBIOTIC-10 PO) Take 1 capsule by mouth daily.   Yes [provider]  promethazine (PHENERGAN) 25 MG tablet Take 1 tablet (25 mg total) by mouth every 6 (six) hours as needed for nausea or vomiting. 11/19/19  Yes [name], [name], MD  sucralfate (CARAFATE) 1 g tablet Take 1 tablet (1 g total) by mouth 4 (four) times daily -  before meals and at bedtime. 10/31/19  Yes Dessa Phi, DO  traZODone (DESYREL) 100 MG tablet Take 2 tablets (200 mg total) by mouth at bedtime as needed for sleep. 10/31/19  Yes Dessa Phi, DO  vitamin B-12 (CYANOCOBALAMIN) 100 MCG tablet Take 100 mcg by mouth daily.   Yes [provider]  zoledronic acid (RECLAST) 5 MG/100ML SOLN injection Inject 5 mg into the vein once.   Yes [provider]    Physical  Exam: Vitals:   11/26/19 0028 11/26/19 0107 11/26/19 0212 11/26/19 0403  BP:   137/67 130/61  Pulse:   79 85  Resp:   20 20  Temp: (!) 102.6 F (39.2 C) (!) 100.7 F (38.2 C) 100.2 F (37.9 C) 99.7 F (37.6 C)  TempSrc: Oral Oral Oral Oral  SpO2:   100% 99%  Weight:      Height:        Constitutional: NAD, calm, comfortable Vitals:   11/26/19 0028 11/26/19 0107 11/26/19 0212 11/26/19 0403  BP:   137/67 130/61  Pulse:   79 85  Resp:   20 20  Temp: (!) 102.6 F (39.2 C) (!) 100.7 F (38.2 C) 100.2 F (37.9 C) 99.7 F (37.6 C)  TempSrc: Oral Oral Oral Oral  SpO2:   100% 99%  Weight:      Height:       Eyes: PERRL, lids and conjunctivae normal ENMT: Mucous membranes are moist. Posterior pharynx clear of any exudate or lesions.Normal dentition.  Neck: normal, supple, no masses, no thyromegaly Respiratory: clear to auscultation bilaterally, no wheezing, no crackles. Normal respiratory effort. No accessory muscle use.  Cardiovascular: Regular rate and rhythm, no murmurs / rubs / gallops. No extremity edema. 2+ pedal pulses. No carotid bruits.  Abdomen: Soft, nondistended but severe tenderness on deep palpation in left lower quadrant.  No hepatosplenomegaly.  Bowel sounds positive. Musculoskeletal: no clubbing / cyanosis. No joint deformity upper and lower extremities. Good ROM, no contractures. Normal muscle tone.  Skin: no rashes, lesions, ulcers. No induration Neurologic: CN 2-12 grossly intact. Sensation intact, DTR normal. Strength 5/5 in all 4.  Psychiatric: Normal judgment and insight. Alert and oriented x 3. Normal mood.   Labs on Admission: I have personally reviewed following labs and imaging studies  CBC: Recent Labs  Lab 11/25/19 1642 11/25/19 2335 11/26/19 0209  WBC 4.4 6.2 7.6  NEUTROABS 2.9  --   --   HGB 8.9* 8.9* 8.4*  HCT 28.2* 28.4* 26.3*  MCV 92.8 93.1 94.3  PLT 174 130* 496*   Basic Metabolic Panel: Recent Labs  Lab 11/25/19 1642  11/25/19 2335  NA 138  --   K 4.0  --   CL 103  --   CO2 26  --   GLUCOSE 98  --   BUN 39*  --   CREATININE 1.37* 1.41*  CALCIUM 9.4  --    GFR: Estimated Creatinine Clearance: 38.6 mL/min (A) (by C-G formula based on SCr of 1.41 mg/dL (H)). Liver Function Tests: Recent Labs  Lab 11/25/19 1642  AST 14*  ALT 20  ALKPHOS 167*  BILITOT 0.9  PROT 7.2  ALBUMIN 3.5   Recent Labs  Lab 11/25/19 1642  LIPASE 24   No results for input(s): AMMONIA in the last 168 hours. Coagulation Profile: No results for input(s): INR, PROTIME in the last 168 hours. Cardiac Enzymes: No results for input(s): CKTOTAL, CKMB, CKMBINDEX, TROPONINI in the last 168 hours. BNP (last 3 results) No results for input(s): PROBNP in the last 8760 hours. HbA1C: No results for input(s): HGBA1C in the last 72 hours. CBG: No results for input(s): GLUCAP in the last 168 hours. Lipid Profile: No results for input(s): CHOL, HDL, LDLCALC, TRIG, CHOLHDL, LDLDIRECT in the last 72 hours. Thyroid Function Tests: No results for input(s): TSH, T4TOTAL, FREET4, T3FREE, THYROIDAB in the last 72 hours. Anemia Panel: No results for input(s): VITAMINB12, FOLATE, FERRITIN, TIBC, IRON, RETICCTPCT in the last 72 hours. Urine analysis:    Component Value Date/Time   COLORURINE YELLOW 11/25/2019 1642   APPEARANCEUR HAZY (A) 11/25/2019 1642   LABSPEC 1.021 11/25/2019 1642   PHURINE 6.0 11/25/2019 1642   GLUCOSEU NEGATIVE 11/25/2019 1642   HGBUR NEGATIVE 11/25/2019 1642   Gleason 11/25/2019 1642   [place] 11/25/2019 1642   PROTEINUR 30 (A) 11/25/2019 1642   NITRITE NEGATIVE 11/25/2019 1642   LEUKOCYTESUR NEGATIVE 11/25/2019 1642    Radiological Exams on Admission: CT ABDOMEN PELVIS W CONTRAST  Result Date: 11/25/2019 CLINICAL DATA:  Crohn's exacerbation. Abdominal pain, nausea, vomiting, diarrhea EXAM: CT ABDOMEN AND PELVIS WITH CONTRAST TECHNIQUE: Multidetector CT imaging of the abdomen and  pelvis was performed using the standard protocol following bolus administration of intravenous contrast. CONTRAST:  51m OMNIPAQUE IOHEXOL 300 MG/ML  SOLN COMPARISON:  10/12/2019 FINDINGS: Lower chest: No acute abnormality. Hepatobiliary: Normal liver. Gallbladder is surgically absent. Intrahepatic and extrahepatic biliary ductal dilatation unchanged compared with the prior examination with the common bile duct measuring 12 mm unchanged from the prior exam. Pancreas: Unremarkable. No pancreatic ductal dilatation or surrounding inflammatory changes. Spleen: Normal in size without focal abnormality. Adrenals/Urinary Tract: Adrenal glands are unremarkable. Kidneys are normal, without renal calculi, focal lesion, or hydronephrosis. Bladder is unremarkable. Stomach/Bowel: Stomach is normal. Descending colon and sigmoid colon bowel wall thickening as well as rectal wall thickening as can be seen with proctocolitis secondary to an inflammatory etiology given the patient's history of Crohn's disease. Prior right hemicolectomy with small bowel anastomosis to the transverse colon. No pneumatosis, pneumoperitoneum or portal venous gas. Vascular/Lymphatic: Normal caliber abdominal aorta with mild atherosclerosis. Left femoral vein central venous catheter with the tip in the IVC. No lymphadenopathy. Reproductive: Status post hysterectomy. No adnexal masses. Other: No abdominal wall hernia or abnormality. No abdominopelvic ascites. Musculoskeletal: No acute osseous abnormality. No aggressive osseous lesion. IMPRESSION: 1. Descending colon and sigmoid colon bowel wall thickening as well as rectal wall thickening as can be seen with proctocolitis secondary to an inflammatory etiology given the patient's history of Crohn's disease. 2.  Aortic Atherosclerosis (ICD10-I70.0). Electronically Signed   By: HKathreen Devoid  On: 11/25/2019 18:57  DG Hip [place] or [place] Pelvis 1 View Left  Result Date: 11/25/2019 CLINICAL DATA:  PICC  line placement EXAM: DG HIP (WITH OR WITHOUT PELVIS) 1V PORT LEFT COMPARISON:  October 12, 2019. FINDINGS: There is a central venous catheter projecting over the patient's left pelvis. The tip of this catheter is not visualized on this exam. Exact positioning cannot be determined on this study. There is a sclerotic area within the left iliac bone, stable from prior study. IMPRESSION: PICC line not fully visualized on this exam. Exact positioning cannot be determined. Electronically Signed   By: Constance Holster M.D.   On: 11/25/2019 16:14      Assessment/Plan Principal Problem:   Intractable abdominal pain Intra-abdominal pain most likely secondary to proctocolitis as per CT abdomen/pelvis. Patient is also having temperature of 101 at this time. IV ciprofloxacin and IV metronidazole ordered. IV fluids IV Phenergan 25 mg every 6 hours as needed for nausea and vomiting ordered. Compazine 10 mg IV every 6 hours as needed for nausea and vomiting also ordered. C. difficile was ordered for diarrhea and came back negative  IV Dilaudid 1 mg every 4 hours as needed for severe abdominal pain. Tylenol for fever as needed.  Active Problems:   Crohn's disease ([place]) See above    Diarrhea C. difficile negative Fluids IV ciprofloxacin and metronidazole  Chronic kidney disease Creatinine at baseline    DVT prophylaxis: Heparin Code Status: Full code Admission status: Inpatient/MedSurg   Edmonia Lynch MD [facility]   If 7PM-7AM, please contact night-coverage [email or web address]   11/26/2019, 5:43 AM

## 2019-11-26 NOTE — Anesthesia Procedure Notes (Signed)
Procedure Name: Intubation Date/Time: 11/26/2019 3:22 PM Performed by: Claudia Desanctis, CRNA Pre-anesthesia Checklist: Patient identified, Emergency Drugs available, Suction available and Patient being monitored Patient Re-evaluated:Patient Re-evaluated prior to induction Oxygen Delivery Method: Circle system utilized Preoxygenation: Pre-oxygenation with 100% oxygen Induction Type: IV induction Ventilation: Mask ventilation without difficulty Laryngoscope Size: 2 and Miller Grade View: Grade I Tube type: Oral Tube size: 7.5 mm Number of attempts: 1 Airway Equipment and Method: Stylet Placement Confirmation: ETT inserted through vocal cords under direct vision,  positive ETCO2 and breath sounds checked- equal and bilateral Secured at: 21 cm Tube secured with: Tape Dental Injury: Teeth and Oropharynx as per pre-operative assessment

## 2019-11-26 NOTE — Op Note (Signed)
[facility] Patient Name: Caitlin Peters Procedure Date: 11/26/2019 MRN: [id number] Attending MD: Jackquline Denmark , MD Date of Birth: 1961/11/08 CSN: [id number] Age: 58 Admit Type: Inpatient Procedure:                ERCP Indications:              Suspected ascending cholangitis in a patient with                            complicated Crohn's disease. S/P cholecystectomy in                            past. Providers:                Jackquline Denmark, MD, Carlyn Reichert, RN Referring MD:              Medicines:                General Anesthesia, Levaquin, Flagyl, Indocin                            suppositories Complications:            No immediate complications. Estimated Blood Loss:     Estimated blood loss: none. Procedure:                Pre-Anesthesia Assessment:                           - Prior to the procedure, a History and Physical                            was performed, and patient medications and                            allergies were reviewed. [name] tolerance of                            previous anesthesia was also reviewed. The risks                            and benefits of the procedure and the sedation                            options and risks were discussed with the patient.                            All questions were answered, and informed consent                            was obtained. Prior Anticoagulants: The patient has                            taken no previous anticoagulant or antiplatelet                            agents. ASA Grade Assessment: III - A  patient with                            severe systemic disease. After reviewing the risks                            and benefits, the patient was deemed in                            satisfactory condition to undergo the procedure.                           After obtaining informed consent, the scope was                            passed under direct vision. Throughout the                          procedure, the patient's blood pressure, pulse, and                            oxygen saturations were monitored continuously. The                            [id number]-Q180V ([id number] Olympus duodenoscope was                            introduced through the mouth, and used to inject                            contrast into and used to inject contrast into the                            bile duct. The ERCP was accomplished without                            difficulty. The patient tolerated the procedure                            well. Scope In: Scope Out: Findings:      The scout film was normal with surgical clips, c/w previous       cholecystectomy. The esophagus was successfully intubated under direct       vision. The scope was advanced to a normal major papilla in the       descending duodenum without detailed examination of the pharynx, larynx       and associated structures, and upper GI tract. The upper GI tract was       grossly normal. Major papilla was normal. The bile duct was deeply       cannulated with the short-nosed traction sphincterotome. Contrast was       injected. I personally interpreted the bile duct images. Image quality       was adequate.      The biliary orifice was stenotic. This appeared benign. The entire       biliary tree was dilated with CBD measuring  15 mm. It was torturous and       "sigmoid" in appearance. The lower third of the main bile duct contained       vague filling defect(s) thought to be sludge. An 8 mm biliary       sphincterotomy was made with a traction (standard) sphincterotome using       ERBE electrocautery (ENDOCUT mode) at 12 o'clock position. There was       brisk drainage of green bile. No pus. There was no post-sphincterotomy       bleeding. Dilation of the common bile duct with a 12-13.5-15 mm balloon       (to a maximum balloon size of 13.5 mm x 1 min each) was performed. The       biliary tree was swept with a  15 mm balloon starting at the bifurcation.       Sludge was swept from the duct. No well-formed stones.      Thereafter, postocclusion cholangiogram was performed with a 15 mm       balloon, injection above. There were no residual filling defects. No       evidence of PSC or any strictures. The cystic duct remnant did fill.      Pancreatic duct was intentionally not cannulated to decrease risk of       pancreatitis. Impression:               -Biliary papillary stenosis, benign.                           -Choledocholithiasis (sludge) s/p biliary                            sphincterotomy, sphincteroplasty followed by                            balloon extraction.                           -No PSC.                           -Previous cholecystectomy. Moderate Sedation:      none Recommendation:           - Watch for pancreatitis, bleeding, perforation,                            and cholangitis.                           - Return patient to hospital ward for ongoing care.                           - Avoid nonsteroidal anti-inflammatory medicines x                            7 days.                           - Clear liquid diet today at 6 pm. Low fat diet in  AM (ordered)                           - Can restart heparin in a.m.for DVT prophylaxis.                           - Recheck LFTs in AM.                           - Continue antibiotics x 7 days.                           - Return to GI clinic in 4 weeks.                           - The findings and recommendations were discussed                            with the patient's family. Procedure Code(s):        --- Professional ---                           [phone], 15, Endoscopic retrograde                            cholangiopancreatography (ERCP); with                            trans-endoscopic balloon dilation of                            biliary/pancreatic duct(s) or of ampulla                             (sphincteroplasty), including sphincterotomy, when                            performed, each duct                           [id number], Endoscopic retrograde                            cholangiopancreatography (ERCP); with removal of                            calculi/debris from biliary/pancreatic duct(s)                           88916, Endoscopic catheterization of the biliary                            ductal system, radiological supervision and                            interpretation Diagnosis Code(s):        --- Professional ---  K83.1, Obstruction of bile duct CPT copyright 2019 American [employer] Association. All rights reserved. The codes documented in this report are preliminary and upon coder review may  be revised to meet current compliance requirements. Jackquline Denmark, MD 11/26/2019 5:12:09 PM This report has been signed electronically. Number of Addenda: 0

## 2019-11-26 NOTE — Interval H&P Note (Signed)
History and Physical Interval Note:  11/26/2019 3:19 PM  Caitlin Peters  has presented today for surgery, with the diagnosis of Cholangitis.  The various methods of treatment have been discussed with the patient and family. After consideration of risks, benefits and other options for treatment, the patient has consented to  Procedure(s): ENDOSCOPIC RETROGRADE CHOLANGIOPANCREATOGRAPHY (ERCP) (N/A) as a surgical intervention.  [name] history has been reviewed, patient examined, no change in status, stable for surgery.  I have reviewed the patient's chart and labs.  Questions were answered to the patient's satisfaction.     Jackquline Denmark

## 2019-11-26 NOTE — Progress Notes (Signed)
   11/25/19 2359  Vitals  Temp (!) 102.2 F (39 C)  BP (!) 152/80  MAP (mmHg) 99  BP Location Right Arm  BP Method Automatic  Patient Position (if appropriate) Lying  Pulse Rate 87  Oxygen Therapy  SpO2 100 %  O2 Device Room Air  MEWS Score  MEWS RR 0  MEWS Pulse 0  MEWS Systolic 0  MEWS LOC 0  MEWS Temp 2  MEWS Score 2  MEWS Score Color Yellow  MEWS Assessment  Is this an acute change? Yes  Mews initiated on call notiied

## 2019-11-26 NOTE — Anesthesia Preprocedure Evaluation (Addendum)
Anesthesia Evaluation  Patient identified by MRN, date of birth, ID band Patient awake    Reviewed: Allergy & Precautions, NPO status , Patient's Chart, lab work & pertinent test results  History of Anesthesia Complications Negative for: history of anesthetic complications  Airway Mallampati: II  TM Distance: >3 FB Neck ROM: Full    Dental no notable dental hx. (+) Dental Advisory Given   Pulmonary neg pulmonary ROS, former smoker,    Pulmonary exam normal        Cardiovascular hypertension, Pt. on medications and Pt. on home beta blockers Normal cardiovascular exam     Neuro/Psych PSYCHIATRIC DISORDERS Depression negative neurological ROS     GI/Hepatic negative GI ROS, Neg liver ROS, Crohn's Ds   Endo/Other  negative endocrine ROS  Renal/GU Renal InsufficiencyRenal disease     Musculoskeletal negative musculoskeletal ROS (+)   Abdominal   Peds  Hematology negative hematology ROS (+) anemia ,   Anesthesia Other Findings Day of surgery medications reviewed with the patient.  Reproductive/Obstetrics                            Anesthesia Physical Anesthesia Plan  ASA: III and emergent  Anesthesia Plan: General   Post-op Pain Management:    Induction: Intravenous  PONV Risk Score and Plan: 3 and Ondansetron, Dexamethasone and Diphenhydramine  Airway Management Planned: Oral ETT  Additional Equipment:   Intra-op Plan:   Post-operative Plan: Extubation in OR  Informed Consent: I have reviewed the patients History and Physical, chart, labs and discussed the procedure including the risks, benefits and alternatives for the proposed anesthesia with the patient or authorized representative who has indicated his/her understanding and acceptance.     Dental advisory given  Plan Discussed with: CRNA and Anesthesiologist  Anesthesia Plan Comments:        Anesthesia Quick  Evaluation

## 2019-11-26 NOTE — Progress Notes (Signed)
PROGRESS NOTE    Caitlin Peters  DZH:[id number] DOB: [birth year] DOA: 11/25/2019 PCP: [name], [name], MD  Brief Narrative: This is a chronically ill 58 year old female with debilitating Crohn's disease for 36 years requiring 7-8 surgeries so far, she was followed by [facility] clinic in [place], just recently moved to [place] in October 2020 to be closer to her daughters. 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 -She has chronic pain with narcotic dependency, presented to the ED early this morning with worsening abdominal pain nausea vomiting and diarrhea, she had a CT abdomen pelvis done in the ED which was concerning for pancolitis, subsequently she had a temp of 102 and profound worsening LFTs with AST ALT in the 2000 range  Assessment & Plan:   Abdominal pain nausea vomiting, fever -Proctocolitis -Severe worsening LFTs -Continue IV fluids today, continue ciprofloxacin and Flagyl -MRCP today to rule out choledocholithiasis, concern for cholangitis -Also follow-up blood cultures, she has a left groin PICC line which could be a source for bacteremia   Severe debilitating Crohn's disease for 36 years -Short gut syndrome on chronic TPN since 2003 -Now with proctocolitis, continue antibiotics as noted above -Resume TPN pending above workup   Chronic pain, narcotic dependence -Resume home regimen of methadone and PO dilaudid   DVT prophylaxis: Heparin subcutaneous Code Status: Full code Family Communication: No family at bedside Disposition Plan: Home pending above work-up  Consultants:   Gastroenterology   Procedures:   Antimicrobials:    Subjective: -Febrile to 102 overnight, complains of acute on chronic diffuse abdominal pain, no nausea or vomiting today, chronic diarrhea reported  Objective: Vitals:   11/26/19 0212 11/26/19 0403 11/26/19 0804 11/26/19 1248  BP: 137/67  130/61 (!) 122/59 133/76  Pulse: 79 85 85 85  Resp: 20 20 18 18   Temp: 100.2 F (37.9 C) 99.7 F (37.6 C) 99.6 F (37.6 C) 99 F (37.2 C)  TempSrc: Oral Oral    SpO2: 100% 99% 100% 100%  Weight:      Height:        Intake/Output Summary (Last 24 hours) at 11/26/2019 1253 Last data filed at 11/26/2019 0600 Gross per 24 hour  Intake 2837.23 ml  Output -  Net 2837.23 ml   Filed Weights   11/25/19 1157  Weight: 56.2 kg    Examination:  General exam: Extremely cachectic chronically ill African-American female, laying in bed, no distress Respiratory system: Decreased breath sounds the bases Cardiovascular system: S1 & S2 heard, RRR.  Gastrointestinal system: Soft, multiple surgical scars, bowel sounds increased, mild diffuse tenderness Central nervous system: Alert and oriented. No focal neurological deficits. Extremities: No edema, left femoral PICC line noted Skin: No rashes, lesions or ulcers Psychiatry: Pleasant, normal insight and judgment    Data Reviewed:   CBC: Recent Labs  Lab 11/25/19 1642 11/25/19 2335 11/26/19 0209 11/26/19 0630  WBC 4.4 6.2 7.6 16.8*  NEUTROABS 2.9  --   --   --   HGB 8.9* 8.9* 8.4* 8.7*  HCT 28.2* 28.4* 26.3* 27.8*  MCV 92.8 93.1 94.3 95.5  PLT 174 130* 117* 419*   Basic Metabolic Panel: Recent Labs  Lab 11/25/19 1642 11/25/19 2335 11/26/19 0630  NA 138  --  136  K 4.0  --  4.0  CL 103  --  104  CO2 26  --  20*  GLUCOSE 98  --  90  BUN  39*  --  36*  CREATININE 1.37* 1.41* 1.79*  CALCIUM 9.4  --  8.7*   GFR: Estimated Creatinine Clearance: 30.4 mL/min (A) (by C-G formula based on SCr of 1.79 mg/dL (H)). Liver Function Tests: Recent Labs  Lab 11/25/19 1642 11/26/19 0630  AST 14* 2,103*  ALT 20 1,668*  ALKPHOS 167* 443*  BILITOT 0.9 2.4*  PROT 7.2 6.2*  ALBUMIN 3.5 2.8*   Recent Labs  Lab 11/25/19 1642  LIPASE 24   No results for input(s): AMMONIA in the last 168 hours. Coagulation Profile: No results for  input(s): INR, PROTIME in the last 168 hours. Cardiac Enzymes: No results for input(s): CKTOTAL, CKMB, CKMBINDEX, TROPONINI in the last 168 hours. BNP (last 3 results) No results for input(s): PROBNP in the last 8760 hours. HbA1C: No results for input(s): HGBA1C in the last 72 hours. CBG: No results for input(s): GLUCAP in the last 168 hours. Lipid Profile: No results for input(s): CHOL, HDL, LDLCALC, TRIG, CHOLHDL, LDLDIRECT in the last 72 hours. Thyroid Function Tests: No results for input(s): TSH, T4TOTAL, FREET4, T3FREE, THYROIDAB in the last 72 hours. Anemia Panel: No results for input(s): VITAMINB12, FOLATE, FERRITIN, TIBC, IRON, RETICCTPCT in the last 72 hours. Urine analysis:    Component Value Date/Time   COLORURINE YELLOW 11/25/2019 1642   APPEARANCEUR HAZY (A) 11/25/2019 1642   LABSPEC 1.021 11/25/2019 1642   PHURINE 6.0 11/25/2019 1642   GLUCOSEU NEGATIVE 11/25/2019 1642   HGBUR NEGATIVE 11/25/2019 [place] 11/25/2019 1642   [place] 11/25/2019 1642   PROTEINUR 30 (A) 11/25/2019 1642   NITRITE NEGATIVE 11/25/2019 1642   LEUKOCYTESUR NEGATIVE 11/25/2019 1642   Sepsis Labs: @LABRCNTIP (procalcitonin:4,lacticidven:4)  ) Recent Results (from the past 240 hour(s))  SARS CORONAVIRUS 2 (TAT 6-24 HRS) Nasopharyngeal Nasopharyngeal Swab     Status: None   Collection Time: 11/25/19  4:42 PM   Specimen: Nasopharyngeal Swab  Result Value Ref Range Status   SARS Coronavirus 2 NEGATIVE NEGATIVE Final    Comment: (NOTE) SARS-CoV-2 target nucleic acids are NOT DETECTED. The SARS-CoV-2 RNA is generally detectable in upper and lower respiratory specimens during the acute phase of infection. Negative results do not preclude SARS-CoV-2 infection, do not rule out co-infections with other pathogens, and should not be used as the sole basis for treatment or other patient management decisions. Negative results must be combined with clinical observations,  patient history, and epidemiological information. The expected result is Negative. Fact Sheet for Patients: SugarRoll.be Fact Sheet for Healthcare Providers: https://www.woods-mathews.com/ This test is not yet approved or cleared by the [place] FDA and  has been authorized for detection and/or diagnosis of SARS-CoV-2 by FDA under an Emergency Use Authorization (EUA). This EUA will remain  in effect (meaning this test can be used) for the duration of the COVID-19 declaration under Section 56 4(b)(1) of the Act, 21 U.S.C. section 360bbb-3(b)(1), unless the authorization is terminated or revoked sooner. Performed at [facility], [place] [address], [place], [place] [address]   C difficile quick scan w PCR reflex     Status: None   Collection Time: 11/25/19  8:50 PM   Specimen: STOOL  Result Value Ref Range Status   C Diff antigen NEGATIVE NEGATIVE Final   C Diff toxin NEGATIVE NEGATIVE Final   C Diff interpretation No C. difficile detected.  Final    Comment: Performed at [facility], [place] [address] [place],  [address]  Blood culture (routine x  2)     Status: None (Preliminary result)   Collection Time: 11/25/19  8:50 PM   Specimen: BLOOD  Result Value Ref Range Status   Specimen Description   Final    BLOOD PICC LINE Performed at [facility], [place] [address] [place], [place] [address]    Special Requests   Final    BOTTLES DRAWN AEROBIC AND ANAEROBIC Blood Culture adequate volume Performed at [facility] [address], [place], [place] [address]    Culture  Setup Time   Final    GRAM NEGATIVE RODS IN BOTH AEROBIC AND ANAEROBIC BOTTLES Organism ID to follow CRITICAL RESULT CALLED TO, READ BACK BY AND VERIFIED WITH: Christean Grief PharmD 12:35 11/26/19 ([email or web address]) Performed at [facility], [address], [place], [place] [address]    Culture GRAM NEGATIVE RODS   Final   Report Status PENDING  Incomplete  Blood Culture ID Panel (Reflexed)     Status: Abnormal   Collection Time: 11/25/19  8:50 PM  Result Value Ref Range Status   Enterococcus species NOT DETECTED NOT DETECTED Final   Listeria monocytogenes NOT DETECTED NOT DETECTED Final   Staphylococcus species NOT DETECTED NOT DETECTED Final   Staphylococcus aureus (BCID) NOT DETECTED NOT DETECTED Final   Streptococcus species NOT DETECTED NOT DETECTED Final   Streptococcus agalactiae NOT DETECTED NOT DETECTED Final   Streptococcus pneumoniae NOT DETECTED NOT DETECTED Final   Streptococcus pyogenes NOT DETECTED NOT DETECTED Final   Acinetobacter baumannii NOT DETECTED NOT DETECTED Final   Enterobacteriaceae species DETECTED (A) NOT DETECTED Final    Comment: Enterobacteriaceae represent a large family of gram-negative bacteria, not a single organism. CRITICAL RESULT CALLED TO, READ BACK BY AND VERIFIED WITH: Christean Grief PharmD 12:35 11/26/19 ([email or web address])    Enterobacter cloacae complex DETECTED (A) NOT DETECTED Final    Comment: CRITICAL RESULT CALLED TO, READ BACK BY AND VERIFIED WITH: Christean Grief PharmD 12:35 11/26/19 ([email or web address])    Escherichia coli NOT DETECTED NOT DETECTED Final   Klebsiella oxytoca NOT DETECTED NOT DETECTED Final   Klebsiella pneumoniae NOT DETECTED NOT DETECTED Final   Proteus species NOT DETECTED NOT DETECTED Final   Serratia marcescens NOT DETECTED NOT DETECTED Final   Carbapenem resistance NOT DETECTED NOT DETECTED Final   Haemophilus influenzae NOT DETECTED NOT DETECTED Final   Neisseria meningitidis NOT DETECTED NOT DETECTED Final   Pseudomonas aeruginosa NOT DETECTED NOT DETECTED Final   Candida albicans NOT DETECTED NOT DETECTED Final   Candida glabrata NOT DETECTED NOT DETECTED Final   Candida krusei NOT DETECTED NOT DETECTED Final   Candida parapsilosis NOT DETECTED NOT DETECTED Final   Candida tropicalis NOT DETECTED NOT DETECTED Final    Comment: Performed at [facility], [place] [address] [place], [place] [address]  Blood culture (routine x 2)     Status: None (Preliminary result)   Collection Time: 11/26/19  6:30 AM   Specimen: BLOOD  Result Value Ref Range Status   Specimen Description   Final    BLOOD RIGHT ARM Performed at [facility] [address] [place], [place] [address]    Special Requests   Final    BOTTLES DRAWN AEROBIC ONLY Blood Culture adequate volume Performed at [facility] [address] [place], [place] [address]    Culture   Final    NO GROWTH < 12 HOURS Performed at [facility] [address], [place], [place]  [address]    Report Status PENDING  Incomplete         Radiology Studies: CT ABDOMEN PELVIS W CONTRAST  Result Date: 11/25/2019 CLINICAL DATA:  Crohn's exacerbation. Abdominal pain, nausea, vomiting, diarrhea EXAM: CT ABDOMEN AND PELVIS WITH CONTRAST TECHNIQUE: Multidetector CT imaging of the abdomen and pelvis was performed using the standard protocol following bolus administration of intravenous contrast. CONTRAST:  40m OMNIPAQUE IOHEXOL 300 MG/ML  SOLN COMPARISON:  10/12/2019 FINDINGS: Lower chest: No acute abnormality. Hepatobiliary: Normal liver. Gallbladder is surgically absent. Intrahepatic and extrahepatic biliary ductal dilatation unchanged compared with the prior examination with the common bile duct measuring 12 mm unchanged from the prior exam. Pancreas: Unremarkable. No pancreatic ductal dilatation or surrounding inflammatory changes. Spleen: Normal in size without focal abnormality. Adrenals/Urinary Tract: Adrenal glands are unremarkable. Kidneys are normal, without renal calculi, focal lesion, or hydronephrosis. Bladder is unremarkable. Stomach/Bowel: Stomach is normal. Descending colon and sigmoid colon bowel wall thickening as well as rectal wall thickening as can be seen with proctocolitis secondary to an inflammatory etiology given the patient's  history of Crohn's disease. Prior right hemicolectomy with small bowel anastomosis to the transverse colon. No pneumatosis, pneumoperitoneum or portal venous gas. Vascular/Lymphatic: Normal caliber abdominal aorta with mild atherosclerosis. Left femoral vein central venous catheter with the tip in the IVC. No lymphadenopathy. Reproductive: Status post hysterectomy. No adnexal masses. Other: No abdominal wall hernia or abnormality. No abdominopelvic ascites. Musculoskeletal: No acute osseous abnormality. No aggressive osseous lesion. IMPRESSION: 1. Descending colon and sigmoid colon bowel wall thickening as well as rectal wall thickening as can be seen with proctocolitis secondary to an inflammatory etiology given the patient's history of Crohn's disease. 2.  Aortic Atherosclerosis (ICD10-I70.0). Electronically Signed   By: HKathreen Devoid  On: 11/25/2019 18:57   DG Hip [place] Left  Result Date: 11/25/2019 CLINICAL DATA:  PICC line placement EXAM: DG HIP (WITH OR WITHOUT PELVIS) 1V PORT LEFT COMPARISON:  October 12, 2019. FINDINGS: There is a central venous catheter projecting over the patient's left pelvis. The tip of this catheter is not visualized on this exam. Exact positioning cannot be determined on this study. There is a sclerotic area within the left iliac bone, stable from prior study. IMPRESSION: PICC line not fully visualized on this exam. Exact positioning cannot be determined. Electronically Signed   By: CConstance HolsterM.D.   On: 11/25/2019 16:14        Scheduled Meds: . ALPRAZolam  0.5 mg Oral Once  . Chlorhexidine Gluconate Cloth  6 each Topical Daily  . heparin  5,000 Units Subcutaneous Q8H   Continuous Infusions: . sodium chloride 100 mL/hr at 11/25/19 2327  . sodium chloride    . ciprofloxacin 400 mg (11/26/19 0942)  . metronidazole 500 mg (11/26/19 0510)     LOS: 1 day    Time spent: 442m    PrDomenic PoliteMD [facility]   11/26/2019, 12:53 PM

## 2019-11-27 DIAGNOSIS — K50018 Crohn's disease of small intestine with other complication: Secondary | ICD-10-CM

## 2019-11-27 DIAGNOSIS — R109 Unspecified abdominal pain: Secondary | ICD-10-CM

## 2019-11-27 DIAGNOSIS — R7881 Bacteremia: Secondary | ICD-10-CM

## 2019-11-27 LAB — CBC WITH DIFFERENTIAL/PLATELET
Abs Immature Granulocytes: 1.06 10*3/uL — ABNORMAL HIGH (ref 0.00–0.07)
Basophils Absolute: 0 10*3/uL (ref 0.0–0.1)
Basophils Relative: 0 %
Eosinophils Absolute: 0 10*3/uL (ref 0.0–0.5)
Eosinophils Relative: 0 %
HCT: 27.2 % — ABNORMAL LOW (ref 36.0–46.0)
Hemoglobin: 8.7 g/dL — ABNORMAL LOW (ref 12.0–15.0)
Immature Granulocytes: 5 %
Lymphocytes Relative: 5 %
Lymphs Abs: 1.2 10*3/uL (ref 0.7–4.0)
MCH: 30 pg (ref 26.0–34.0)
MCHC: 32 g/dL (ref 30.0–36.0)
MCV: 93.8 fL (ref 80.0–100.0)
Monocytes Absolute: 0.8 10*3/uL (ref 0.1–1.0)
Monocytes Relative: 4 %
Neutro Abs: 18.9 10*3/uL — ABNORMAL HIGH (ref 1.7–7.7)
Neutrophils Relative %: 86 %
Platelets: 108 10*3/uL — ABNORMAL LOW (ref 150–400)
RBC: 2.9 MIL/uL — ABNORMAL LOW (ref 3.87–5.11)
RDW: 14.9 % (ref 11.5–15.5)
WBC: 21.9 10*3/uL — ABNORMAL HIGH (ref 4.0–10.5)
nRBC: 0 % (ref 0.0–0.2)

## 2019-11-27 LAB — VITAMIN B12: Vitamin B-12: 1698 pg/mL — ABNORMAL HIGH (ref 180–914)

## 2019-11-27 LAB — RETICULOCYTES
Immature Retic Fract: 12.5 % (ref 2.3–15.9)
RBC.: 2.67 MIL/uL — ABNORMAL LOW (ref 3.87–5.11)
Retic Count, Absolute: 29.9 10*3/uL (ref 19.0–186.0)
Retic Ct Pct: 1.1 % (ref 0.4–3.1)

## 2019-11-27 LAB — COMPREHENSIVE METABOLIC PANEL
ALT: 1001 U/L — ABNORMAL HIGH (ref 0–44)
AST: 548 U/L — ABNORMAL HIGH (ref 15–41)
Albumin: 2.9 g/dL — ABNORMAL LOW (ref 3.5–5.0)
Alkaline Phosphatase: 343 U/L — ABNORMAL HIGH (ref 38–126)
Anion gap: 10 (ref 5–15)
BUN: 42 mg/dL — ABNORMAL HIGH (ref 6–20)
CO2: 20 mmol/L — ABNORMAL LOW (ref 22–32)
Calcium: 8.6 mg/dL — ABNORMAL LOW (ref 8.9–10.3)
Chloride: 111 mmol/L (ref 98–111)
Creatinine, Ser: 1.75 mg/dL — ABNORMAL HIGH (ref 0.44–1.00)
GFR calc Af Amer: 37 mL/min — ABNORMAL LOW (ref >60)
GFR calc non Af Amer: 32 mL/min — ABNORMAL LOW (ref >60)
Glucose, Bld: 90 mg/dL (ref 70–99)
Potassium: 4.7 mmol/L (ref 3.5–5.1)
Sodium: 141 mmol/L (ref 135–145)
Total Bilirubin: 3.4 mg/dL — ABNORMAL HIGH (ref 0.3–1.2)
Total Protein: 5.9 g/dL — ABNORMAL LOW (ref 6.5–8.1)

## 2019-11-27 LAB — IRON AND TIBC
Iron: 169 ug/dL (ref 28–170)
Saturation Ratios: 88 % — ABNORMAL HIGH (ref 10.4–31.8)
TIBC: 192 ug/dL — ABNORMAL LOW (ref 250–450)
UIBC: 23 ug/dL

## 2019-11-27 LAB — FOLATE: Folate: 19.8 ng/mL (ref >5.9)

## 2019-11-27 LAB — FERRITIN: Ferritin: 794 ng/mL — ABNORMAL HIGH (ref 11–307)

## 2019-11-27 MED ORDER — PROMETHAZINE HCL 25 MG/ML IJ SOLN
6.2500 mg | Freq: Four times a day (QID) | INTRAMUSCULAR | Status: DC | PRN
  Administered 2019-11-28 – 2019-11-30 (×4): 6.25 mg via INTRAVENOUS
  Filled 2019-11-27 (×4): qty 1

## 2019-11-27 NOTE — Progress Notes (Addendum)
[facility] Progress Note  CC:  Crohn's disease   Subjective: He reports feeling well this morning.  No nausea or vomiting.  No abdominal pain.  She is tolerating clear liquids.  Objective:  Vital signs in last 24 hours: Temp:  [97.9 F (36.6 C)-99 F (37.2 C)] 98.5 F (36.9 C) (01/01 0614) Pulse Rate:  [64-85] 66 (01/01 0927) Resp:  [14-20] 20 (01/01 0614) BP: (116-155)/(55-80) 146/80 (01/01 0927) SpO2:  [95 %-100 %] 99 % (01/01 0614) Weight:  [56.2 kg] 56.2 kg (12/31 1418) Last BM Date: 11/26/19 General:   Alert,  Well-developed in no acute distress. Eyes: Sclera nonicteric. Heart: Regular rate and rhythm, no murmurs. Pulm: Breath sounds clear throughout. Abdomen: Soft, nondistended, positive bowel sounds to all 4 quadrants, no HSM. Extremities:  Without edema. Neurologic:  Alert and  oriented x4;  grossly normal neurologically. Psych:  Alert and cooperative. Normal mood and affect.  Intake/Output from previous day: 12/31 0701 - 01/01 0700 In: 3133.9 [I.V.:2554.4; IV Piggyback:579.5] Out: 0  Intake/Output this shift: No intake/output data recorded.  Lab Results: Recent Labs    11/26/19 0209 11/26/19 0630 11/27/19 0414  WBC 7.6 16.8* 21.9*  HGB 8.4* 8.7* 8.7*  HCT 26.3* 27.8* 27.2*  PLT 117* 122* 108*   BMET Recent Labs    11/25/19 1642 11/25/19 2335 11/26/19 0630 11/27/19 0414  NA 138  --  136 141  K 4.0  --  4.0 4.7  CL 103  --  104 111  CO2 26  --  20* 20*  GLUCOSE 98  --  90 90  BUN 39*  --  36* 42*  CREATININE 1.37* 1.41* 1.79* 1.75*  CALCIUM 9.4  --  8.7* 8.6*   LFT Recent Labs    11/27/19 0414  PROT 5.9*  ALBUMIN 2.9*  AST 548*  ALT 1,001*  ALKPHOS 343*  BILITOT 3.4*   PT/INR No results for input(s): LABPROT, INR in the last 72 hours. Hepatitis Panel No results for input(s): HEPBSAG, HCVAB, HEPAIGM, HEPBIGM in the last 72 hours.  CT ABDOMEN PELVIS W CONTRAST  Result Date: 11/25/2019 CLINICAL DATA:  Crohn's  exacerbation. Abdominal pain, nausea, vomiting, diarrhea EXAM: CT ABDOMEN AND PELVIS WITH CONTRAST TECHNIQUE: Multidetector CT imaging of the abdomen and pelvis was performed using the standard protocol following bolus administration of intravenous contrast. CONTRAST:  60m OMNIPAQUE IOHEXOL 300 MG/ML  SOLN COMPARISON:  10/12/2019 FINDINGS: Lower chest: No acute abnormality. Hepatobiliary: Normal liver. Gallbladder is surgically absent. Intrahepatic and extrahepatic biliary ductal dilatation unchanged compared with the prior examination with the common bile duct measuring 12 mm unchanged from the prior exam. Pancreas: Unremarkable. No pancreatic ductal dilatation or surrounding inflammatory changes. Spleen: Normal in size without focal abnormality. Adrenals/Urinary Tract: Adrenal glands are unremarkable. Kidneys are normal, without renal calculi, focal lesion, or hydronephrosis. Bladder is unremarkable. Stomach/Bowel: Stomach is normal. Descending colon and sigmoid colon bowel wall thickening as well as rectal wall thickening as can be seen with proctocolitis secondary to an inflammatory etiology given the patient's history of Crohn's disease. Prior right hemicolectomy with small bowel anastomosis to the transverse colon. No pneumatosis, pneumoperitoneum or portal venous gas. Vascular/Lymphatic: Normal caliber abdominal aorta with mild atherosclerosis. Left femoral vein central venous catheter with the tip in the IVC. No lymphadenopathy. Reproductive: Status post hysterectomy. No adnexal masses. Other: No abdominal wall hernia or abnormality. No abdominopelvic ascites. Musculoskeletal: No acute osseous abnormality. No aggressive osseous lesion. IMPRESSION: 1. Descending colon and sigmoid colon bowel wall  thickening as well as rectal wall thickening as can be seen with proctocolitis secondary to an inflammatory etiology given the patient's history of Crohn's disease. 2.  Aortic Atherosclerosis (ICD10-I70.0).  Electronically Signed   By: Kathreen Devoid   On: 11/25/2019 18:57   DG ERCP BILIARY & PANCREATIC DUCTS  Result Date: 11/26/2019 CLINICAL DATA:  ERCP with sphincterotomy. EXAM: ERCP TECHNIQUE: Multiple spot images obtained with the fluoroscopic device and submitted for interpretation post-procedure. FLUOROSCOPY TIME:  Fluoroscopy Time:  7 minutes and 1 second Number of Acquired Spot Images: 17 COMPARISON:  CT dated 11/25/2019 FINDINGS: There is cannulation of the common bile duct with injection of contrast demonstrating intrahepatic and extrahepatic biliary ductal dilatation. The patient appears to have undergone balloon dilatation of the distal common bile duct and sphincter. IMPRESSION: ERCP as above. These images were submitted for radiologic interpretation only. Please see the procedural report for the amount of contrast and the fluoroscopy time utilized. Electronically Signed   By: Constance Holster M.D.   On: 11/26/2019 20:54   DG Hip [place] Pelvis 1 View Left  Result Date: 11/25/2019 CLINICAL DATA:  PICC line placement EXAM: DG HIP (WITH OR WITHOUT PELVIS) 1V PORT LEFT COMPARISON:  October 12, 2019. FINDINGS: There is a central venous catheter projecting over the patient's left pelvis. The tip of this catheter is not visualized on this exam. Exact positioning cannot be determined on this study. There is a sclerotic area within the left iliac bone, stable from prior study. IMPRESSION: PICC line not fully visualized on this exam. Exact positioning cannot be determined. Electronically Signed   By: Constance Holster M.D.   On: 11/25/2019 16:14    Assessment / Plan: 30.  59 year old female with a complex past medical history significant for Crohn's disease, short gut syndrome from multiple intestinal surgeries previously followed by the [facility] clinic. Previously on chronic TPN.  He presented to the ED with abdominal pain.  Abdominal/pelvic CT identified left-sided colitis.  C.  difficile negative.  GI pathogen pending.  Her symptoms have improved with IV Cipro and Flagyl. -Await GI panel results -Patient referred to [facility] to establish care with the intestinal transplant program as  recommended by Dr. Tarri Glenn  2.  RUQ pain, acute elevation in her LFTs with leukocytosis. She underwent an ERCP 11/26/2019 with Dr. Lyndel Safe which identified biliary papillary stenosis, choledocholithiasis with sludge status post biliary sphincterotomy, sphincteroplasty followed by balloon extraction. Postocclusion cholangiogram was performed with a 15 mm balloon, injection above. There were no residual filling defects. No evidence of PSC or any strictures. The cystic duct remnant did fill.  She is on Levaquin 750 mg IV every 48 hours and Flagyl 500 mg IV every 8 hours. -Repeat CBC and hepatic panel in a.m. -Continue antibiotics for total of 7 days -Continue Pantoprazole 40 mg p.o. daily -Advance to soft diet -Zofran as needed -IV fluid per the hospitalist  Further recommendations per Dr. Silverio Decamp   Principal Problem:   Intractable abdominal pain Active Problems:   Crohn's disease ([place])   Diarrhea     LOS: 2 days   Noralyn Pick  11/27/2019, 11:25 AM   Attending physician's note   I have taken an interval history, reviewed the chart and examined the patient. I agree with the Advanced Practitioner's note, impression and recommendations.   Ascending cholangitis and bacteremia On Levaquin and Flagyl S/p ERCP with sphincterotomy, sphincteroplasty and balloon sweep.  No evidence of PSC biliary stricture.  No  CBD stones.  LFT trending down, no fever, is hemodynamically stable.  Advance diet as tolerated  Continue supportive care per hospitalist team   Damaris Hippo , MD [phone]

## 2019-11-27 NOTE — Progress Notes (Signed)
PROGRESS NOTE    Caitlin Peters  MVH:[id number] DOB: 07-11-1961 DOA: 11/25/2019 PCP: [name], [name], MD  Brief Narrative: This is a chronically ill 59 year old female with debilitating Crohn's disease for 36 years requiring 7-8 surgeries so far, she was followed by [facility] clinic in [place], just recently moved to [place] in October 2020 to be closer to her daughters. 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 -She has chronic pain with narcotic dependency, presented to the ED early this morning with worsening abdominal pain nausea vomiting and diarrhea, she had a CT abdomen pelvis done in the ED which was concerning for pancolitis, subsequently she had a temp of 102 and profound worsening LFTs with AST ALT in the 2000 range  Assessment & Plan:   Acute ascending cholangitis Enterobacter bacteremia  -Status post urgent ERCP with sphincterotomy, sphincteroplasty, no evidence of PSC or stricture noted -Clinically improving, continue IV levofloxacin and Flagyl, repeat blood cultures -Advance diet as tolerated -Continue IV fluids today -Restart TPN from tomorrow  Severe Crohn's disease with proctocolitis -Continue IV Levaquin and Flagyl as above -Liquid diet, advance as tolerated -On chronic TPN, hold this for now, will consider restarting it tomorrow, patient reports that she has low fat or no fat in her TPN due to liver enzyme abnormalities -Not appropriate for steroids or any other immunomodulators for Crohn's at this time in the setting of ascending colitis and bacteremia   Severe debilitating Crohn's disease for 36 years -Short gut syndrome on chronic TPN since 2003 -Now with proctocolitis, continue antibiotics as noted above -Resume TPN tomorrow   Chronic pain, narcotic dependence -Resume home regimen of methadone and PO dilaudid  Anemia of chronic disease -Check anemia panel   DVT  prophylaxis: Heparin subcutaneous Code Status: Full code Family Communication: No family at bedside Disposition Plan: Home pending above work-up  Consultants:   Gastroenterology   Procedures:   Antimicrobials:    Subjective: -Feels better today, has chronic abdominal discomfort but reports improvement in her pain from yesterday -No vomiting, tolerated ERCP and stenting without problems last night  Objective: Vitals:   11/26/19 2024 11/27/19 0434 11/27/19 0614 11/27/19 0927  BP: 139/70 (!) 144/75 (!) 116/55 (!) 146/80  Pulse: 70 74 64 66  Resp: 19 20 20    Temp: 98.4 F (36.9 C) 98.7 F (37.1 C) 98.5 F (36.9 C)   TempSrc: Oral Oral Oral   SpO2: 96% 99% 99%   Weight:      Height:        Intake/Output Summary (Last 24 hours) at 11/27/2019 1421 Last data filed at 11/27/2019 0600 Gross per 24 hour  Intake 3133.89 ml  Output 0 ml  Net 3133.89 ml   Filed Weights   11/25/19 1157 11/26/19 1418  Weight: 56.2 kg 56.2 kg    Examination:  Gen: Chronically ill, thinly built African-American female, sitting up in bed, no distress HEENT: PERRLA, Neck supple, no JVD Lungs: Decreased breath sounds the bases CVS: RRR,No Gallops,Rubs or new Murmurs Abd: Soft, numerous surgical scars, bowel sounds present, mild diffuse tenderness Extremities: No edema, left femoral PICC line noted Skin: no new rashes Psychiatry: Pleasant, normal insight and judgment    Data Reviewed:   CBC: Recent Labs  Lab 11/25/19 1642 11/25/19 2335 11/26/19 0209 11/26/19 0630 11/27/19 0414  WBC 4.4 6.2 7.6 16.8* 21.9*  NEUTROABS 2.9  --   --   --  18.9*  HGB 8.9* 8.9* 8.4* 8.7* 8.7*  HCT 28.2* 28.4* 26.3* 27.8* 27.2*  MCV 92.8 93.1 94.3 95.5 93.8  PLT 174 130* 117* 122* 037*   Basic Metabolic Panel: Recent Labs  Lab 11/25/19 1642 11/25/19 2335 11/26/19 0630 11/27/19 0414  NA 138  --  136 141  K 4.0  --  4.0 4.7  CL 103  --  104 111  CO2 26  --  20* 20*  GLUCOSE 98  --  90 90  BUN 39*   --  36* 42*  CREATININE 1.37* 1.41* 1.79* 1.75*  CALCIUM 9.4  --  8.7* 8.6*   GFR: Estimated Creatinine Clearance: 31.1 mL/min (A) (by C-G formula based on SCr of 1.75 mg/dL (H)). Liver Function Tests: Recent Labs  Lab 11/25/19 1642 11/26/19 0630 11/27/19 0414  AST 14* 2,103* 548*  ALT 20 1,668* 1,001*  ALKPHOS 167* 443* 343*  BILITOT 0.9 2.4* 3.4*  PROT 7.2 6.2* 5.9*  ALBUMIN 3.5 2.8* 2.9*   Recent Labs  Lab 11/25/19 1642  LIPASE 24   No results for input(s): AMMONIA in the last 168 hours. Coagulation Profile: No results for input(s): INR, PROTIME in the last 168 hours. Cardiac Enzymes: No results for input(s): CKTOTAL, CKMB, CKMBINDEX, TROPONINI in the last 168 hours. BNP (last 3 results) No results for input(s): PROBNP in the last 8760 hours. HbA1C: No results for input(s): HGBA1C in the last 72 hours. CBG: No results for input(s): GLUCAP in the last 168 hours. Lipid Profile: No results for input(s): CHOL, HDL, LDLCALC, TRIG, CHOLHDL, LDLDIRECT in the last 72 hours. Thyroid Function Tests: No results for input(s): TSH, T4TOTAL, FREET4, T3FREE, THYROIDAB in the last 72 hours. Anemia Panel: No results for input(s): VITAMINB12, FOLATE, FERRITIN, TIBC, IRON, RETICCTPCT in the last 72 hours. Urine analysis:    Component Value Date/Time   COLORURINE YELLOW 11/25/2019 1642   APPEARANCEUR HAZY (A) 11/25/2019 1642   LABSPEC 1.021 11/25/2019 1642   PHURINE 6.0 11/25/2019 1642   GLUCOSEU NEGATIVE 11/25/2019 1642   HGBUR NEGATIVE 11/25/2019 1642   BILIRUBINUR NEGATIVE 11/25/2019 1642   KETONESUR NEGATIVE 11/25/2019 1642   PROTEINUR 30 (A) 11/25/2019 1642   NITRITE NEGATIVE 11/25/2019 1642   LEUKOCYTESUR NEGATIVE 11/25/2019 1642   Sepsis Labs: @LABRCNTIP (procalcitonin:4,lacticidven:4)  ) Recent Results (from the past 240 hour(s))  Urine culture     Status: Abnormal   Collection Time: 11/25/19  4:42 PM   Specimen: Urine, Random  Result Value Ref Range Status    Specimen Description   Final    URINE, RANDOM Performed at [facility], [place] [address] [place], [place] [address]    Special Requests   Final    NONE Performed at [facility], [place] [address], [place], [place] [address]    Culture (A)  Final    <10,000 COLONIES/mL INSIGNIFICANT GROWTH Performed at [facility] [address] [place], [place] [address]    Report Status 11/26/2019 FINAL  Final  SARS CORONAVIRUS 2 (TAT 6-24 HRS) Nasopharyngeal Nasopharyngeal Swab     Status: None   Collection Time: 11/25/19  4:42 PM   Specimen: Nasopharyngeal Swab  Result Value Ref Range Status   SARS Coronavirus 2 NEGATIVE NEGATIVE Final    Comment: (NOTE) SARS-CoV-2 target nucleic acids are NOT DETECTED. The SARS-CoV-2 RNA is generally detectable in upper and lower respiratory specimens during the acute phase of infection. Negative results do not preclude SARS-CoV-2 infection, do not rule out co-infections with other pathogens,  and should not be used as the sole basis for treatment or other patient management decisions. Negative results must be combined with clinical observations, patient history, and epidemiological information. The expected result is Negative. Fact Sheet for Patients: SugarRoll.be Fact Sheet for Healthcare Providers: https://www.woods-mathews.com/ This test is not yet approved or cleared by the [place] FDA and  has been authorized for detection and/or diagnosis of SARS-CoV-2 by FDA under an Emergency Use Authorization (EUA). This EUA will remain  in effect (meaning this test can be used) for the duration of the COVID-19 declaration under Section 56 4(b)(1) of the Act, 21 U.S.C. section 360bbb-3(b)(1), unless the authorization is terminated or revoked sooner. Performed at [facility], [place] [address], [place], [place] [address]   C difficile quick scan w PCR reflex     Status:  None   Collection Time: 11/25/19  8:50 PM   Specimen: STOOL  Result Value Ref Range Status   C Diff antigen NEGATIVE NEGATIVE Final   C Diff toxin NEGATIVE NEGATIVE Final   C Diff interpretation No C. difficile detected.  Final    Comment: Performed at [facility], [place] [address], [place], [place] [address]  Blood culture (routine x 2)     Status: Abnormal (Preliminary result)   Collection Time: 11/25/19  8:50 PM   Specimen: BLOOD  Result Value Ref Range Status   Specimen Description   Final    BLOOD PICC LINE Performed at [facility], [place] [address], [place], [place] [address]    Special Requests   Final    BOTTLES DRAWN AEROBIC AND ANAEROBIC Blood Culture adequate volume Performed at [name] [address], [place], [place] [address]    Culture  Setup Time   Final    GRAM NEGATIVE RODS IN BOTH AEROBIC AND ANAEROBIC BOTTLES CRITICAL RESULT CALLED TO, READ BACK BY AND VERIFIED WITH: Christean Grief PharmD 12:35 11/26/19 ([email or web address]) Performed at [facility], [place] [address], [place], [place] [address]    Culture ENTEROBACTER CLOACAE (A)  Final   Report Status PENDING  Incomplete  Blood Culture ID Panel (Reflexed)     Status: Abnormal   Collection Time: 11/25/19  8:50 PM  Result Value Ref Range Status   Enterococcus species NOT DETECTED NOT DETECTED Final   Listeria monocytogenes NOT DETECTED NOT DETECTED Final   Staphylococcus species NOT DETECTED NOT DETECTED Final   Staphylococcus aureus (BCID) NOT DETECTED NOT DETECTED Final   Streptococcus species NOT DETECTED NOT DETECTED Final   Streptococcus agalactiae NOT DETECTED NOT DETECTED Final   Streptococcus pneumoniae NOT DETECTED NOT DETECTED Final   Streptococcus pyogenes NOT DETECTED NOT DETECTED Final   Acinetobacter baumannii NOT DETECTED NOT DETECTED Final   Enterobacteriaceae species DETECTED (A) NOT DETECTED Final    Comment: Enterobacteriaceae represent a  large family of gram-negative bacteria, not a single organism. CRITICAL RESULT CALLED TO, READ BACK BY AND VERIFIED WITH: Christean Grief PharmD 12:35 11/26/19 ([email or web address])    Enterobacter cloacae complex DETECTED (A) NOT DETECTED Final    Comment: CRITICAL RESULT CALLED TO, READ BACK BY AND VERIFIED WITH: Christean Grief PharmD 12:35 11/26/19 ([email or web address])    Escherichia coli NOT DETECTED NOT DETECTED Final   Klebsiella oxytoca NOT DETECTED NOT DETECTED Final   Klebsiella pneumoniae NOT DETECTED NOT DETECTED Final   Proteus species NOT DETECTED NOT DETECTED Final   Serratia marcescens NOT DETECTED NOT DETECTED Final   Carbapenem resistance NOT DETECTED NOT DETECTED  Final   Haemophilus influenzae NOT DETECTED NOT DETECTED Final   Neisseria meningitidis NOT DETECTED NOT DETECTED Final   Pseudomonas aeruginosa NOT DETECTED NOT DETECTED Final   Candida albicans NOT DETECTED NOT DETECTED Final   Candida glabrata NOT DETECTED NOT DETECTED Final   Candida krusei NOT DETECTED NOT DETECTED Final   Candida parapsilosis NOT DETECTED NOT DETECTED Final   Candida tropicalis NOT DETECTED NOT DETECTED Final    Comment: Performed at [facility], [place] [address] [place], [name] [address]  Blood culture (routine x 2)     Status: None (Preliminary result)   Collection Time: 11/26/19  6:30 AM   Specimen: BLOOD  Result Value Ref Range Status   Specimen Description   Final    BLOOD RIGHT ARM Performed at [place] [address], [place], [place] [address]    Special Requests   Final    BOTTLES DRAWN AEROBIC ONLY Blood Culture adequate volume Performed at [facility] [address] [place], [place] [address]    Culture   Final    NO GROWTH 1 DAY Performed at [facility], [place] [address], [place], [place] [address]    Report Status PENDING  Incomplete         Radiology Studies: CT ABDOMEN PELVIS W CONTRAST  Result Date: 11/25/2019 CLINICAL DATA:   Crohn's exacerbation. Abdominal pain, nausea, vomiting, diarrhea EXAM: CT ABDOMEN AND PELVIS WITH CONTRAST TECHNIQUE: Multidetector CT imaging of the abdomen and pelvis was performed using the standard protocol following bolus administration of intravenous contrast. CONTRAST:  38m OMNIPAQUE IOHEXOL 300 MG/ML  SOLN COMPARISON:  10/12/2019 FINDINGS: Lower chest: No acute abnormality. Hepatobiliary: Normal liver. Gallbladder is surgically absent. Intrahepatic and extrahepatic biliary ductal dilatation unchanged compared with the prior examination with the common bile duct measuring 12 mm unchanged from the prior exam. Pancreas: Unremarkable. No pancreatic ductal dilatation or surrounding inflammatory changes. Spleen: Normal in size without focal abnormality. Adrenals/Urinary Tract: Adrenal glands are unremarkable. Kidneys are normal, without renal calculi, focal lesion, or hydronephrosis. Bladder is unremarkable. Stomach/Bowel: Stomach is normal. Descending colon and sigmoid colon bowel wall thickening as well as rectal wall thickening as can be seen with proctocolitis secondary to an inflammatory etiology given the patient's history of Crohn's disease. Prior right hemicolectomy with small bowel anastomosis to the transverse colon. No pneumatosis, pneumoperitoneum or portal venous gas. Vascular/Lymphatic: Normal caliber abdominal aorta with mild atherosclerosis. Left femoral vein central venous catheter with the tip in the IVC. No lymphadenopathy. Reproductive: Status post hysterectomy. No adnexal masses. Other: No abdominal wall hernia or abnormality. No abdominopelvic ascites. Musculoskeletal: No acute osseous abnormality. No aggressive osseous lesion. IMPRESSION: 1. Descending colon and sigmoid colon bowel wall thickening as well as rectal wall thickening as can be seen with proctocolitis secondary to an inflammatory etiology given the patient's history of Crohn's disease. 2.  Aortic Atherosclerosis (ICD10-I70.0).  Electronically Signed   By: HKathreen Devoid  On: 11/25/2019 18:57   DG ERCP BILIARY & PANCREATIC DUCTS  Result Date: 11/26/2019 CLINICAL DATA:  ERCP with sphincterotomy. EXAM: ERCP TECHNIQUE: Multiple spot images obtained with the fluoroscopic device and submitted for interpretation post-procedure. FLUOROSCOPY TIME:  Fluoroscopy Time:  7 minutes and 1 second Number of Acquired Spot Images: 17 COMPARISON:  CT dated 11/25/2019 FINDINGS: There is cannulation of the common bile duct with injection of contrast demonstrating intrahepatic and extrahepatic biliary ductal dilatation. The patient appears to have undergone balloon dilatation of the  distal common bile duct and sphincter. IMPRESSION: ERCP as above. These images were submitted for radiologic interpretation only. Please see the procedural report for the amount of contrast and the fluoroscopy time utilized. Electronically Signed   By: Constance Holster M.D.   On: 11/26/2019 20:54   DG Hip [place] or [place] Pelvis 1 View Left  Result Date: 11/25/2019 CLINICAL DATA:  PICC line placement EXAM: DG HIP (WITH OR WITHOUT PELVIS) 1V PORT LEFT COMPARISON:  October 12, 2019. FINDINGS: There is a central venous catheter projecting over the patient's left pelvis. The tip of this catheter is not visualized on this exam. Exact positioning cannot be determined on this study. There is a sclerotic area within the left iliac bone, stable from prior study. IMPRESSION: PICC line not fully visualized on this exam. Exact positioning cannot be determined. Electronically Signed   By: Constance Holster M.D.   On: 11/25/2019 16:14        Scheduled Meds: . budesonide  9 mg Oral Daily  . buPROPion  200 mg Oral Daily  . Chlorhexidine Gluconate Cloth  6 each Topical Daily  . dicyclomine  20 mg Oral TID  . DULoxetine  90 mg Oral Daily  . estradiol  2 mg Oral Daily  . heparin  5,000 Units Subcutaneous Q8H  . methadone  20 mg Oral Q12H  . metoprolol tartrate  100 mg  Oral BID  . pantoprazole  40 mg Oral Daily  . potassium chloride SA  40 mEq Oral Daily   Continuous Infusions: . sodium chloride 75 mL/hr at 11/27/19 0932  . levofloxacin (LEVAQUIN) IV 750 mg (11/26/19 2257)  . metronidazole 500 mg (11/27/19 0532)     LOS: 2 days    Time spent: 49mn  PDomenic Polite MD [facility]  11/27/2019, 2:21 PM

## 2019-11-28 ENCOUNTER — Encounter: Payer: Self-pay | Admitting: *Deleted

## 2019-11-28 DIAGNOSIS — R1084 Generalized abdominal pain: Secondary | ICD-10-CM

## 2019-11-28 DIAGNOSIS — K8309 Other cholangitis: Secondary | ICD-10-CM

## 2019-11-28 LAB — GI PATHOGEN PANEL BY PCR, STOOL
Adenovirus F 40/41: NOT DETECTED
Astrovirus: NOT DETECTED
Campylobacter by PCR: NOT DETECTED
Cryptosporidium by PCR: NOT DETECTED
Cyclospora cayetanensis: NOT DETECTED
E coli (ETEC) LT/ST: NOT DETECTED
E coli (STEC): NOT DETECTED
Entamoeba histolytica: NOT DETECTED
Enteroaggregative E coli: NOT DETECTED
Enteropathogenic E coli: NOT DETECTED
G lamblia by PCR: NOT DETECTED
Norovirus GI/GII: NOT DETECTED
Plesiomonas shigelloides: NOT DETECTED
Rotavirus A by PCR: NOT DETECTED
Salmonella by PCR: NOT DETECTED
Sapovirus: NOT DETECTED
Shigella by PCR: NOT DETECTED
Vibrio cholerae: NOT DETECTED
Vibrio: NOT DETECTED
Yersinia enterocolitica: NOT DETECTED

## 2019-11-28 LAB — BASIC METABOLIC PANEL
Anion gap: 8 (ref 5–15)
BUN: 32 mg/dL — ABNORMAL HIGH (ref 6–20)
CO2: 20 mmol/L — ABNORMAL LOW (ref 22–32)
Calcium: 8.6 mg/dL — ABNORMAL LOW (ref 8.9–10.3)
Chloride: 112 mmol/L — ABNORMAL HIGH (ref 98–111)
Creatinine, Ser: 1.76 mg/dL — ABNORMAL HIGH (ref 0.44–1.00)
GFR calc Af Amer: 36 mL/min — ABNORMAL LOW (ref >60)
GFR calc non Af Amer: 31 mL/min — ABNORMAL LOW (ref >60)
Glucose, Bld: 111 mg/dL — ABNORMAL HIGH (ref 70–99)
Potassium: 3.9 mmol/L (ref 3.5–5.1)
Sodium: 140 mmol/L (ref 135–145)

## 2019-11-28 LAB — CBC WITH DIFFERENTIAL/PLATELET
Abs Immature Granulocytes: 0.11 10*3/uL — ABNORMAL HIGH (ref 0.00–0.07)
Basophils Absolute: 0 10*3/uL (ref 0.0–0.1)
Basophils Relative: 0 %
Eosinophils Absolute: 0.1 10*3/uL (ref 0.0–0.5)
Eosinophils Relative: 1 %
HCT: 26.2 % — ABNORMAL LOW (ref 36.0–46.0)
Hemoglobin: 8.1 g/dL — ABNORMAL LOW (ref 12.0–15.0)
Immature Granulocytes: 1 %
Lymphocytes Relative: 16 %
Lymphs Abs: 2 10*3/uL (ref 0.7–4.0)
MCH: 30 pg (ref 26.0–34.0)
MCHC: 30.9 g/dL (ref 30.0–36.0)
MCV: 97 fL (ref 80.0–100.0)
Monocytes Absolute: 0.5 10*3/uL (ref 0.1–1.0)
Monocytes Relative: 4 %
Neutro Abs: 9.7 10*3/uL — ABNORMAL HIGH (ref 1.7–7.7)
Neutrophils Relative %: 78 %
Platelets: 121 10*3/uL — ABNORMAL LOW (ref 150–400)
RBC: 2.7 MIL/uL — ABNORMAL LOW (ref 3.87–5.11)
RDW: 15.2 % (ref 11.5–15.5)
WBC: 12.4 10*3/uL — ABNORMAL HIGH (ref 4.0–10.5)
nRBC: 0 % (ref 0.0–0.2)

## 2019-11-28 LAB — HEPATIC FUNCTION PANEL
ALT: 638 U/L — ABNORMAL HIGH (ref 0–44)
AST: 163 U/L — ABNORMAL HIGH (ref 15–41)
Albumin: 2.9 g/dL — ABNORMAL LOW (ref 3.5–5.0)
Alkaline Phosphatase: 332 U/L — ABNORMAL HIGH (ref 38–126)
Bilirubin, Direct: 0.7 mg/dL — ABNORMAL HIGH (ref 0.0–0.2)
Indirect Bilirubin: 0.6 mg/dL (ref 0.3–0.9)
Total Bilirubin: 1.3 mg/dL — ABNORMAL HIGH (ref 0.3–1.2)
Total Protein: 6.5 g/dL (ref 6.5–8.1)

## 2019-11-28 LAB — CULTURE, BLOOD (ROUTINE X 2): Special Requests: ADEQUATE

## 2019-11-28 LAB — GLUCOSE, CAPILLARY: Glucose-Capillary: 96 mg/dL (ref 70–99)

## 2019-11-28 LAB — LIPASE, BLOOD: Lipase: 18 U/L (ref 11–51)

## 2019-11-28 MED ORDER — DIPHENOXYLATE-ATROPINE 2.5-0.025 MG PO TABS: 1.0000 | ORAL_TABLET | Freq: Four times a day (QID) | ORAL | Status: DC | PRN

## 2019-11-28 MED ORDER — LOPERAMIDE HCL 2 MG PO CAPS
2.0000 mg | ORAL_CAPSULE | Freq: Four times a day (QID) | ORAL | Status: DC | PRN
  Administered 2019-11-28 – 2019-11-30 (×5): 2 mg via ORAL
  Filled 2019-11-28 (×6): qty 1

## 2019-11-28 MED ORDER — TRAVASOL 10 % IV SOLN
INTRAVENOUS | Status: AC
  Filled 2019-11-28: qty 990

## 2019-11-28 MED ORDER — INSULIN ASPART 100 UNIT/ML ~~LOC~~ SOLN: 0.0000 [IU] | SUBCUTANEOUS | Status: DC

## 2019-11-28 NOTE — Progress Notes (Signed)
PHARMACY - TOTAL PARENTERAL NUTRITION CONSULT NOTE   Indication: Chronic TPN for Crohn's Disease  Patient Measurements: Height: 5' 8"  (172.7 cm) Weight: 123 lb 14.4 oz (56.2 kg) IBW/kg (Calculated) : 63.9 TPN AdjBW (KG): 56.2 Body mass index is 18.84 kg/m.  Assessment:  59 yo F presents with dehydration, worsening abdominal pain.  Hx of debilitating Crohns disease s/p small bowel resections. Has been on chronic TPN since 2003. More recently has been cycling TPN over 20 hrs. Hard stick and has PICC in left femoral. Also has enterobacter bacteremia. Pharmacy consulted to restart TPN.  Current PTA TPN ([facility] [phone]) 1835m over 20 hrs Dextrose 261 g Plenamine 90 g SMOFlipid 40 g Electrolytes -Na Acetate 948m -Potassium Phos 27 mMol -Ca gluconate 9 mEq -Mag Sulfate 16 mEq -Potassium acetate 51 mEq -Multitrace5conc 75m45mGlucose / Insulin: No hx of DM. CBGs < 180 Electrolytes: Lytes wnl exc K 4.7. Had scheduled K ordered so d/c'd. Did receive 1/2 dose before stopping. Renal: SCr 1.75.  LFTs / TGs: LFTs and Tbili elevated potentially in part due to chronic TPN. Uses lower amounts of lipids. Prealbumin / albumin: Albumin 2.9, pre-albumin pending Intake / Output; MIVF: 3x urine occurrences yesterday, still having diarrhea GI Imaging: 12/30 CT shows proctocolitis Surgeries / Procedures:  Significant bowel resections in the past  Central access: Single lumen PICC (Placed in October) TPN start date: PTA TPN > held > plan to restart 1/3  Nutritional Goals (per RD recommendation on **): KCal: 1800-2000, Protein: 90-100g, Fluid: > 2 L  Goal cyclic TPN is 1,82,330QTer 20 hrs. (provides 95 g of protein and 2000 kcals per day)  Current Nutrition:  Soft diet for comfort  Plan:  Restart cyclic TPN. Infuse total volume of 1800 over 20 hours. Start at 61m45m x 1 hour, then increase to 95ml33mx 18 hours, then decrease back to 61ml/82m 1  hour Electrolytes in TPN: 50mEq/30m Na, 20mEq/L63mK, 5mEq/L o61ma, 5mEq/L of62m, and 15mmol/L o42mos. Cl:Ac ratio 1:1 Add standard MVI MWFand trace elements to TPN Initiate custom Sensitive SSI and adjust as needed  Monitor TPN labs on Mon/Thurs, tomorrow F/U RD recs  Cicely Ortner,Reginia Naas1:08 AM

## 2019-11-28 NOTE — Progress Notes (Signed)
PROGRESS NOTE    Caitlin Peters  XNT:[id number] DOB: 1961/01/16 DOA: 11/25/2019 PCP: [name], [name], MD  Brief Narrative: This is a chronically ill 59 year old female with debilitating Crohn's disease for 36 years requiring 7-8 surgeries so far, she was followed by [facility] clinic in [place], just recently moved to [place] in October 2020 to be closer to her daughters. 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 -Found to have ascending cholangitis, with Enterobacter bacteremia, gastroenterology consulted, she underwent ERCP with sphincterotomy -Slowly improving, on IV antibiotics  Assessment & Plan:   Acute ascending cholangitis Enterobacter bacteremia  -Status post urgent ERCP with sphincterotomy, sphincteroplasty, no evidence of PSC or stricture noted -Clinically improving, continue IV levofloxacin and Flagyl, repeat blood cultures negative x1 day -Advance diet as tolerated -Stop IV fluids today, mild edema noted -Restart TPN -d/w ID Dr.Comer, since this is gram-negative bacteremia with source control as above and limited access options with left femoral PICC, plan to leave PICC line in, continue IVLevaquin for now and then change to p.o. to rx for 10days  Severe Crohn's disease with proctocolitis -Continue IV Levaquin and Flagyl as above -Liquid diet, advance as tolerated -On chronic TPN, restart this, pharmacy consult -Not appropriate for steroids or any other immunomodulators for Crohn's at this time in the setting of ascending colitis and bacteremia   Severe debilitating  Crohn's disease for 36 years -Short gut syndrome on chronic TPN since 2003 -Now with proctocolitis, continue antibiotics as noted above -Resume TPN today   Chronic pain, narcotic dependence -Continue home regimen of methadone and PO dilaudid  Anemia of chronic disease -Anemia panel consistent with chronic disease, monitor  Chronic kidney disease stage III -Creatinine stable around 1.7 which likely  Severe protein calorie malnutrition -Restart TPN   DVT prophylaxis: Heparin subcutaneous Code Status: Full code Family Communication: No family at bedside Disposition Plan: Home pending above work-up  Consultants:   Gastroenterology   Procedures:   Antimicrobials:    Subjective: -Complains of mild swelling in her left arm, both legs, denies any dyspnea or cough, abdominal discomfort is improving  Objective: Vitals:   11/27/19 0927 11/27/19 1428 11/27/19 2141 11/28/19 0355  BP: (!) 146/80 (!) 114/102 (!) 142/67   Pulse: 66 76 69 (!) 50  Resp:  18 20 20   Temp:  98.5 F (36.9 C) 98.9 F (37.2 C) 99 F (37.2 C)  TempSrc:  Oral Oral Oral  SpO2:  98% 99% 95%  Weight:      Height:        Intake/Output Summary (Last 24 hours) at 11/28/2019 1315 Last data filed at 11/28/2019 0600 Gross per 24 hour  Intake 1928.74 ml  Output --  Net 1928.74 ml   Filed Weights   11/25/19 1157 11/26/19 1418  Weight: 56.2 kg 56.2 kg    Examination:  Gen: Chronically ill female, sitting up in bed, pleasant, AAO times HEENT: PERRLA, Neck supple, no JVD Lungs: Decreased breath sounds both bases CVS: RRR,No Gallops,Rubs or new Murmurs Abd: Soft, extensive surgical scars, mild right-sided tenderness, bowel sounds present Extremities: Trace lower extremity and  left arm edema, left femoral PICC line Skin: no new rashes Psychiatry: Pleasant, normal insight and judgment    Data Reviewed:   CBC: Recent Labs  Lab 11/25/19 1642 11/25/19 2335 11/26/19 0209 11/26/19 0630 11/27/19 0414  11/28/19 0700  WBC 4.4 6.2 7.6 16.8* 21.9* 12.4*  NEUTROABS 2.9  --   --   --  18.9* 9.7*  HGB 8.9* 8.9* 8.4* 8.7* 8.7* 8.1*  HCT 28.2* 28.4* 26.3* 27.8* 27.2* 26.2*  MCV 92.8 93.1 94.3 95.5 93.8 97.0  PLT 174 130* 117* 122* 108* 428*   Basic Metabolic Panel: Recent Labs  Lab 11/25/19 1642 11/25/19 2335 11/26/19 0630 11/27/19 0414 11/28/19 0700  NA 138  --  136 141 140  K 4.0  --  4.0 4.7 3.9  CL 103  --  104 111 112*  CO2 26  --  20* 20* 20*  GLUCOSE 98  --  90 90 111*  BUN 39*  --  36* 42* 32*  CREATININE 1.37* 1.41* 1.79* 1.75* 1.76*  CALCIUM 9.4  --  8.7* 8.6* 8.6*   GFR: Estimated Creatinine Clearance: 30.9 mL/min (A) (by C-G formula based on SCr of 1.76 mg/dL (H)). Liver Function Tests: Recent Labs  Lab 11/25/19 1642 11/26/19 0630 11/27/19 0414 11/28/19 0700  AST 14* 2,103* 548* 163*  ALT 20 1,668* 1,001* 638*  ALKPHOS 167* 443* 343* 332*  BILITOT 0.9 2.4* 3.4* 1.3*  PROT 7.2 6.2* 5.9* 6.5  ALBUMIN 3.5 2.8* 2.9* 2.9*   Recent Labs  Lab 11/25/19 1642 11/28/19 0700  LIPASE 24 18   No results for input(s): AMMONIA in the last 168 hours. Coagulation Profile: No results for input(s): INR, PROTIME in the last 168 hours. Cardiac Enzymes: No results for input(s): CKTOTAL, CKMB, CKMBINDEX, TROPONINI in the last 168 hours. BNP (last 3 results) No results for input(s): PROBNP in the last 8760 hours. HbA1C: No results for input(s): HGBA1C in the last 72 hours. CBG: No results for input(s): GLUCAP in the last 168 hours. Lipid Profile: No results for input(s): CHOL, HDL, LDLCALC, TRIG, CHOLHDL, LDLDIRECT in the last 72 hours. Thyroid Function Tests: No results for input(s): TSH, T4TOTAL, FREET4, T3FREE, THYROIDAB in the last 72 hours. Anemia Panel: Recent Labs    11/27/19 1645  VITAMINB12 1,698*  FOLATE 19.8  FERRITIN 794*  TIBC 192*  IRON 169  RETICCTPCT 1.1   Urine analysis:    Component Value Date/Time   COLORURINE YELLOW 11/25/2019 1642    APPEARANCEUR HAZY (A) 11/25/2019 1642   LABSPEC 1.021 11/25/2019 1642   PHURINE 6.0 11/25/2019 1642   GLUCOSEU NEGATIVE 11/25/2019 1642   HGBUR NEGATIVE 11/25/2019 1642   BILIRUBINUR NEGATIVE 11/25/2019 1642   KETONESUR NEGATIVE 11/25/2019 1642   PROTEINUR 30 (A) 11/25/2019 1642   NITRITE NEGATIVE 11/25/2019 1642   LEUKOCYTESUR NEGATIVE 11/25/2019 1642   Sepsis Labs: @LABRCNTIP (procalcitonin:4,lacticidven:4)  ) Recent Results (from the past 240 hour(s))  Urine culture     Status: Abnormal   Collection Time: 11/25/19  4:42 PM   Specimen: Urine, Random  Result Value Ref Range Status   Specimen Description   Final    URINE, RANDOM Performed at [facility] [address] [place], [place] [address]    Special Requests   Final    NONE Performed at [facility], [place] [address] [place], [place] [address]    Culture (A)  Final    <10,000 COLONIES/mL INSIGNIFICANT GROWTH Performed at [facility]  [address], [place], [place] [address]    Report Status 11/26/2019 FINAL  Final  SARS CORONAVIRUS 2 (TAT 6-24 HRS) Nasopharyngeal Nasopharyngeal Swab     Status: None   Collection Time: 11/25/19  4:42 PM   Specimen: Nasopharyngeal Swab  Result Value Ref Range Status   SARS Coronavirus 2 NEGATIVE NEGATIVE Final    Comment: (NOTE) SARS-CoV-2 target nucleic acids are NOT DETECTED. The SARS-CoV-2 RNA is generally detectable in upper and lower respiratory specimens during the acute phase of infection. Negative results do not preclude SARS-CoV-2 infection, do not rule out co-infections with other pathogens, and should not be used as the sole basis for treatment or other patient management decisions. Negative results must be combined with clinical observations, patient history, and epidemiological information. The expected result is Negative. Fact Sheet for Patients: SugarRoll.be Fact Sheet for Healthcare  Providers: https://www.woods-mathews.com/ This test is not yet approved or cleared by the [place] FDA and  has been authorized for detection and/or diagnosis of SARS-CoV-2 by FDA under an Emergency Use Authorization (EUA). This EUA will remain  in effect (meaning this test can be used) for the duration of the COVID-19 declaration under Section 56 4(b)(1) of the Act, 21 U.S.C. section 360bbb-3(b)(1), unless the authorization is terminated or revoked sooner. Performed at [facility], [place] [address] [place], [place] [address]   C difficile quick scan w PCR reflex     Status: None   Collection Time: 11/25/19  8:50 PM   Specimen: STOOL  Result Value Ref Range Status   C Diff antigen NEGATIVE NEGATIVE Final   C Diff toxin NEGATIVE NEGATIVE Final   C Diff interpretation No C. difficile detected.  Final    Comment: Performed at [facility], [place] [address] [place], [place] [address]  GI pathogen panel by PCR, stool     Status: None   Collection Time: 11/25/19  8:50 PM   Specimen: STOOL  Result Value Ref Range Status   Plesiomonas shigelloides NOT DETECTED NOT DETECTED Final   Yersinia enterocolitica NOT DETECTED NOT DETECTED Final   Vibrio NOT DETECTED NOT DETECTED Final   Enteropathogenic E coli NOT DETECTED NOT DETECTED Final   E coli (ETEC) LT/ST NOT DETECTED NOT DETECTED Final   E coli 8185 by PCR Not applicable NOT DETECTED Final   Cryptosporidium by PCR NOT DETECTED NOT DETECTED Final   Entamoeba histolytica NOT DETECTED NOT DETECTED Final   Adenovirus F 40/41 NOT DETECTED NOT DETECTED Final   Norovirus GI/GII NOT DETECTED NOT DETECTED Final   Sapovirus NOT DETECTED NOT DETECTED Final    Comment: (NOTE) Performed At: [facility] [place], [place] [id number] Rush Farmer MD KO:[id number]    Vibrio cholerae NOT DETECTED NOT DETECTED Final   Campylobacter by PCR NOT DETECTED NOT DETECTED Final   Salmonella by  PCR NOT DETECTED NOT DETECTED Final   E coli (STEC) NOT DETECTED NOT DETECTED Final   Enteroaggregative E coli NOT DETECTED NOT DETECTED Final   Shigella by PCR NOT DETECTED NOT DETECTED Final   Cyclospora cayetanensis NOT DETECTED NOT DETECTED Final   Astrovirus NOT DETECTED NOT DETECTED Final   G lamblia by PCR NOT DETECTED NOT DETECTED Final   Rotavirus A by PCR NOT DETECTED NOT DETECTED Final  Blood culture (routine x 2)     Status: Abnormal   Collection Time: 11/25/19  8:50 PM   Specimen: BLOOD  Result Value Ref Range Status   Specimen Description  Final    BLOOD PICC LINE Performed at [facility], [place] [address], [place], [place] [address]    Special Requests   Final    BOTTLES DRAWN AEROBIC AND ANAEROBIC Blood Culture adequate volume Performed at [facility] [address], [place], [place] [address]    Culture  Setup Time   Final    GRAM NEGATIVE RODS IN BOTH AEROBIC AND ANAEROBIC BOTTLES CRITICAL RESULT CALLED TO, READ BACK BY AND VERIFIED WITH: Christean Grief PharmD 12:35 11/26/19 ([email or web address]) Performed at [facility], [place] [address] [place], [place] [address]    Culture ENTEROBACTER CLOACAE (A)  Final   Report Status 11/28/2019 FINAL  Final   Organism ID, Bacteria ENTEROBACTER CLOACAE  Final      Susceptibility   Enterobacter cloacae - MIC*    CEFAZOLIN RESISTANT Resistant     CEFEPIME <=0.12 SENSITIVE Sensitive     CEFTAZIDIME <=1 SENSITIVE Sensitive     CIPROFLOXACIN <=0.25 SENSITIVE Sensitive     GENTAMICIN <=1 SENSITIVE Sensitive     IMIPENEM <=0.25 SENSITIVE Sensitive     TRIMETH/SULFA <=20 SENSITIVE Sensitive     PIP/TAZO 8 SENSITIVE Sensitive     * ENTEROBACTER CLOACAE  Blood Culture ID Panel (Reflexed)     Status: Abnormal   Collection Time: 11/25/19  8:50 PM  Result Value Ref Range Status   Enterococcus species NOT DETECTED NOT DETECTED Final   Listeria monocytogenes NOT DETECTED NOT DETECTED Final    Staphylococcus species NOT DETECTED NOT DETECTED Final   Staphylococcus aureus (BCID) NOT DETECTED NOT DETECTED Final   Streptococcus species NOT DETECTED NOT DETECTED Final   Streptococcus agalactiae NOT DETECTED NOT DETECTED Final   Streptococcus pneumoniae NOT DETECTED NOT DETECTED Final   Streptococcus pyogenes NOT DETECTED NOT DETECTED Final   Acinetobacter baumannii NOT DETECTED NOT DETECTED Final   Enterobacteriaceae species DETECTED (A) NOT DETECTED Final    Comment: Enterobacteriaceae represent a large family of gram-negative bacteria, not a single organism. CRITICAL RESULT CALLED TO, READ BACK BY AND VERIFIED WITH: Christean Grief PharmD 12:35 11/26/19 ([email or web address])    Enterobacter cloacae complex DETECTED (A) NOT DETECTED Final    Comment: CRITICAL RESULT CALLED TO, READ BACK BY AND VERIFIED WITH: Christean Grief PharmD 12:35 11/26/19 ([email or web address])    Escherichia coli NOT DETECTED NOT DETECTED Final   Klebsiella oxytoca NOT DETECTED NOT DETECTED Final   Klebsiella pneumoniae NOT DETECTED NOT DETECTED Final   Proteus species NOT DETECTED NOT DETECTED Final   Serratia marcescens NOT DETECTED NOT DETECTED Final   Carbapenem resistance NOT DETECTED NOT DETECTED Final   Haemophilus influenzae NOT DETECTED NOT DETECTED Final   Neisseria meningitidis NOT DETECTED NOT DETECTED Final   Pseudomonas aeruginosa NOT DETECTED NOT DETECTED Final   Candida albicans NOT DETECTED NOT DETECTED Final   Candida glabrata NOT DETECTED NOT DETECTED Final   Candida krusei NOT DETECTED NOT DETECTED Final   Candida parapsilosis NOT DETECTED NOT DETECTED Final   Candida tropicalis NOT DETECTED NOT DETECTED Final    Comment: Performed at [facility], [place] [address], [place], [place] [address]  Blood culture (routine x 2)     Status: None (Preliminary result)   Collection Time: 11/26/19  6:30 AM   Specimen: BLOOD  Result Value Ref Range Status   Specimen Description   Final    BLOOD RIGHT ARM Performed at [facility] [address], [place], [place] [address]    Special Requests  Final    BOTTLES DRAWN AEROBIC ONLY Blood Culture adequate volume Performed at [facility] [address], [place], [place] [address]    Culture   Final    NO GROWTH 2 DAYS Performed at [facility] [address], [place], [place] [address]    Report Status PENDING  Incomplete  Culture, blood (routine x 2)     Status: None (Preliminary result)   Collection Time: 11/27/19 10:02 AM   Specimen: Right Antecubital; Blood  Result Value Ref Range Status   Specimen Description   Final    RIGHT ANTECUBITAL Performed at [facility] [address], [place], [place] [address]    Special Requests   Final    BOTTLES DRAWN AEROBIC ONLY Blood Culture adequate volume Performed at [facility] [address], [place], [place] [address]    Culture   Final    NO GROWTH < 24 HOURS Performed at [facility] [address], [place], [place] [address]    Report Status PENDING  Incomplete  Culture, blood (routine x 2)     Status: None (Preliminary result)   Collection Time: 11/27/19 10:02 AM   Specimen: Right Antecubital; Blood  Result Value Ref Range Status   Specimen Description   Final    RIGHT ANTECUBITAL Performed at [facility] [address], [place], [place] [address]    Special Requests   Final    BOTTLES DRAWN AEROBIC ONLY Blood Culture results may not be optimal due to an inadequate volume of blood received in culture bottles Performed at [facility] [address], [place], [place] [address]    Culture   Final    NO GROWTH < 24 HOURS Performed at [facility] [address] [place], [place] [address]    Report Status PENDING  Incomplete         Radiology Studies: DG ERCP BILIARY & PANCREATIC DUCTS  Result Date: 11/26/2019 CLINICAL DATA:  ERCP with  sphincterotomy. EXAM: ERCP TECHNIQUE: Multiple spot images obtained with the fluoroscopic device and submitted for interpretation post-procedure. FLUOROSCOPY TIME:  Fluoroscopy Time:  7 minutes and 1 second Number of Acquired Spot Images: 17 COMPARISON:  CT dated 11/25/2019 FINDINGS: There is cannulation of the common bile duct with injection of contrast demonstrating intrahepatic and extrahepatic biliary ductal dilatation. The patient appears to have undergone balloon dilatation of the distal common bile duct and sphincter. IMPRESSION: ERCP as above. These images were submitted for radiologic interpretation only. Please see the procedural report for the amount of contrast and the fluoroscopy time utilized. Electronically Signed   By: Constance Holster M.D.   On: 11/26/2019 20:54        Scheduled Meds: . budesonide  9 mg Oral Daily  . buPROPion  200 mg Oral Daily  . Chlorhexidine Gluconate Cloth  6 each Topical Daily  . dicyclomine  20 mg Oral TID  . DULoxetine  90 mg Oral Daily  . estradiol  2 mg Oral Daily  . heparin  5,000 Units Subcutaneous Q8H  . insulin aspart  0-9 Units Subcutaneous 4 times per day  . methadone  20 mg Oral Q12H  . metoprolol tartrate  100 mg Oral BID  . pantoprazole  40 mg Oral Daily   Continuous Infusions: . levofloxacin (LEVAQUIN) IV 750 mg (11/26/19 2257)  . metronidazole 500 mg (11/28/19 0644)  . TPN CYCLIC-ADULT (ION)  LOS: 3 days   Time spent: 21mn  PDomenic Polite MD [facility]  11/28/2019, 1:15 PM

## 2019-11-28 NOTE — Progress Notes (Addendum)
[facility] Progress Note  CC:  Crohn's disease, elevated LFTs  Subjective: She has passed 4 loose brown stools today. No rectal bleeding. No significant abdominal pain. She is tolerating a soft diet. TPN to be started this evening.    Objective:  Vital signs in last 24 hours: Temp:  [98.5 F (36.9 C)-99 F (37.2 C)] 99 F (37.2 C) (01/02 0355) Pulse Rate:  [50-76] 50 (01/02 0355) Resp:  [18-20] 20 (01/02 0355) BP: (114-146)/(67-102) 142/67 (01/01 2141) SpO2:  [95 %-99 %] 95 % (01/02 0355) Last BM Date: 11/27/19 General:   Alert well developed 59 year old female in NAD. Eyes: Sclera nonicteric. Heart: RRR, no murmur. Pulm: Lungs clear throughout.  Abdomen: Soft, nondistended, nontender, + BS x 4 quads. Midline scar intact. No HSM. Extremities:  Without edema. Neurologic:  Alert and  oriented x4;  grossly normal neurologically. Psych:  Alert and cooperative. Normal mood and affect.  Intake/Output from previous day: 01/01 0701 - 01/02 0700 In: 1928.7 [I.V.:1675.1; IV Piggyback:253.6] Out: -  Intake/Output this shift: Total I/O In: 1157.9 [I.V.:998.1; IV Piggyback:159.8] Out: -   Lab Results: Recent Labs    11/26/19 0209 11/26/19 0630 11/27/19 0414  WBC 7.6 16.8* 21.9*  HGB 8.4* 8.7* 8.7*  HCT 26.3* 27.8* 27.2*  PLT 117* 122* 108*   BMET Recent Labs    11/25/19 1642 11/25/19 2335 11/26/19 0630 11/27/19 0414  NA 138  --  136 141  K 4.0  --  4.0 4.7  CL 103  --  104 111  CO2 26  --  20* 20*  GLUCOSE 98  --  90 90  BUN 39*  --  36* 42*  CREATININE 1.37* 1.41* 1.79* 1.75*  CALCIUM 9.4  --  8.7* 8.6*   LFT Recent Labs    11/27/19 0414  PROT 5.9*  ALBUMIN 2.9*  AST 548*  ALT 1,001*  ALKPHOS 343*  BILITOT 3.4*   PT/INR No results for input(s): LABPROT, INR in the last 72 hours. Hepatitis Panel No results for input(s): HEPBSAG, HCVAB, HEPAIGM, HEPBIGM in the last 72 hours.  DG ERCP BILIARY & PANCREATIC DUCTS  Result Date:  11/26/2019 CLINICAL DATA:  ERCP with sphincterotomy. EXAM: ERCP TECHNIQUE: Multiple spot images obtained with the fluoroscopic device and submitted for interpretation post-procedure. FLUOROSCOPY TIME:  Fluoroscopy Time:  7 minutes and 1 second Number of Acquired Spot Images: 17 COMPARISON:  CT dated 11/25/2019 FINDINGS: There is cannulation of the common bile duct with injection of contrast demonstrating intrahepatic and extrahepatic biliary ductal dilatation. The patient appears to have undergone balloon dilatation of the distal common bile duct and sphincter. IMPRESSION: ERCP as above. These images were submitted for radiologic interpretation only. Please see the procedural report for the amount of contrast and the fluoroscopy time utilized. Electronically Signed   By: Constance Holster M.D.   On: 11/26/2019 20:54    Assessment / Plan:  27. 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 On chronic TPN which has been on hold. She presented to the ED 12/30 with N/V, abdominal pain and diarrhea. An Abdominal/pelvic CT 12/20 identified left-sided colitis. C. difficile and GI pathogen panel 12/30 were negative.  Her symptoms have improved with IV Cipro and Flagyl. She is not on steroids secondary to enterobacter bacteremia.  -continue Levaquin and Flagyl IV, transition to po antibiotics at time of discharge -patient referred to  [facility] to establish care with the intestinal transplant program as  recommended by Dr. Tarri Glenn  2.  RUQ pain, acute elevation in her LFTs with leukocytosis consistent with ascending cholangitis. She underwent an ERCP 11/26/2019 with Dr. Lyndel Safe which identified biliary papillary stenosis, choledocholithiasis with sludge status post biliary sphincterotomy, sphincteroplasty and balloon extraction. Postocclusion cholangiogram was performed  without residual filling defects. No evidence of PSC or any strictures. The cystic duct remnant did fill.  Blood cultures 12/30 grew gram negative rods, Enterobacter Cloacae. She is on Levaquin 750 mg IV every 48 hours and Flagyl 500 mg IV every 8 hours. She remains afebrile. WBC down to 12.4. LFTs drifting downward.  Alk phos 332. AST 163. ALT 638. T. Bili 1.3.  -repeat blood cultures 1/1 pending -continue Levaquin and Flagyl IV -plan to restart TPN, ordered by Dr. Jacinta Shoe  3. Chronic anemia.  Iron 169. TIBC 192. Ferritin 794. No evidence of active GI bleeding.  4. Thrombocytopenia. PLT drifting downward. Admission PLT 174 >>130>>117>>108. Today PLT count up to 121.  CTAP 12/30 identified intrahepatic and extrahepatic biliary duct dilatation otherwise normal liver without evidence of cirrhosis. No splenomegaly.   5. Chronic pain on  Methadone and Dilaudid      Principal Problem:   Intractable abdominal pain Active Problems:   Crohn's disease ([place])   Diarrhea     LOS: 3 days   Noralyn Pick  11/28/2019, 6:50 AM   Attending physician's note   I have taken an interval history, reviewed the chart and examined the patient. I agree with the Advanced Practitioner's note, impression and recommendations.   Damaris Hippo , MD [phone]

## 2019-11-29 LAB — COMPREHENSIVE METABOLIC PANEL
ALT: 448 U/L — ABNORMAL HIGH (ref 0–44)
AST: 115 U/L — ABNORMAL HIGH (ref 15–41)
Albumin: 3.1 g/dL — ABNORMAL LOW (ref 3.5–5.0)
Alkaline Phosphatase: 367 U/L — ABNORMAL HIGH (ref 38–126)
Anion gap: 9 (ref 5–15)
BUN: 26 mg/dL — ABNORMAL HIGH (ref 6–20)
CO2: 19 mmol/L — ABNORMAL LOW (ref 22–32)
Calcium: 8.8 mg/dL — ABNORMAL LOW (ref 8.9–10.3)
Chloride: 110 mmol/L (ref 98–111)
Creatinine, Ser: 1.47 mg/dL — ABNORMAL HIGH (ref 0.44–1.00)
GFR calc Af Amer: 45 mL/min — ABNORMAL LOW (ref >60)
GFR calc non Af Amer: 39 mL/min — ABNORMAL LOW (ref >60)
Glucose, Bld: 124 mg/dL — ABNORMAL HIGH (ref 70–99)
Potassium: 3.8 mmol/L (ref 3.5–5.1)
Sodium: 138 mmol/L (ref 135–145)
Total Bilirubin: 2.4 mg/dL — ABNORMAL HIGH (ref 0.3–1.2)
Total Protein: 6.6 g/dL (ref 6.5–8.1)

## 2019-11-29 LAB — CBC
HCT: 29.4 % — ABNORMAL LOW (ref 36.0–46.0)
Hemoglobin: 9.1 g/dL — ABNORMAL LOW (ref 12.0–15.0)
MCH: 29.5 pg (ref 26.0–34.0)
MCHC: 31 g/dL (ref 30.0–36.0)
MCV: 95.5 fL (ref 80.0–100.0)
Platelets: 113 10*3/uL — ABNORMAL LOW (ref 150–400)
RBC: 3.08 MIL/uL — ABNORMAL LOW (ref 3.87–5.11)
RDW: 14.7 % (ref 11.5–15.5)
WBC: 5.3 10*3/uL (ref 4.0–10.5)
nRBC: 0 % (ref 0.0–0.2)

## 2019-11-29 LAB — GLUCOSE, CAPILLARY
Glucose-Capillary: 102 mg/dL — ABNORMAL HIGH (ref 70–99)
Glucose-Capillary: 105 mg/dL — ABNORMAL HIGH (ref 70–99)
Glucose-Capillary: 113 mg/dL — ABNORMAL HIGH (ref 70–99)
Glucose-Capillary: 123 mg/dL — ABNORMAL HIGH (ref 70–99)
Glucose-Capillary: 128 mg/dL — ABNORMAL HIGH (ref 70–99)
Glucose-Capillary: 86 mg/dL (ref 70–99)

## 2019-11-29 LAB — DIFFERENTIAL
Abs Immature Granulocytes: 0.25 10*3/uL — ABNORMAL HIGH (ref 0.00–0.07)
Basophils Absolute: 0 10*3/uL (ref 0.0–0.1)
Basophils Relative: 0 %
Eosinophils Absolute: 0 10*3/uL (ref 0.0–0.5)
Eosinophils Relative: 1 %
Immature Granulocytes: 5 %
Lymphocytes Relative: 17 %
Lymphs Abs: 0.9 10*3/uL (ref 0.7–4.0)
Monocytes Absolute: 0.2 10*3/uL (ref 0.1–1.0)
Monocytes Relative: 4 %
Neutro Abs: 3.8 10*3/uL (ref 1.7–7.7)
Neutrophils Relative %: 73 %

## 2019-11-29 LAB — PREALBUMIN: Prealbumin: 22.4 mg/dL (ref 18–38)

## 2019-11-29 LAB — TRIGLYCERIDES: Triglycerides: 32 mg/dL (ref <150)

## 2019-11-29 LAB — PHOSPHORUS: Phosphorus: 3 mg/dL (ref 2.5–4.6)

## 2019-11-29 LAB — MAGNESIUM: Magnesium: 1.6 mg/dL — ABNORMAL LOW (ref 1.7–2.4)

## 2019-11-29 MED ORDER — CYCLOSPORINE 0.05 % OP EMUL
1.0000 [drp] | Freq: Two times a day (BID) | OPHTHALMIC | Status: DC
  Administered 2019-11-29 – 2019-11-30 (×3): 1 [drp] via OPHTHALMIC
  Filled 2019-11-29 (×5): qty 30

## 2019-11-29 MED ORDER — LABETALOL HCL 5 MG/ML IV SOLN
10.0000 mg | Freq: Four times a day (QID) | INTRAVENOUS | Status: DC | PRN
  Administered 2019-11-29: 10 mg via INTRAVENOUS
  Filled 2019-11-29 (×2): qty 4

## 2019-11-29 MED ORDER — TRAVASOL 10 % IV SOLN
INTRAVENOUS | Status: AC
  Filled 2019-11-29: qty 990

## 2019-11-29 MED ORDER — INSULIN ASPART 100 UNIT/ML ~~LOC~~ SOLN
0.0000 [IU] | SUBCUTANEOUS | Status: DC
  Administered 2019-11-29: 1 [IU] via SUBCUTANEOUS

## 2019-11-29 MED ORDER — PANCRELIPASE (LIP-PROT-AMYL) 36000-114000 UNITS PO CPEP
108000.0000 [IU] | ORAL_CAPSULE | Freq: Three times a day (TID) | ORAL | Status: DC
  Administered 2019-11-29 – 2019-11-30 (×3): 108000 [IU] via ORAL
  Filled 2019-11-29 (×4): qty 3

## 2019-11-29 MED ORDER — MAGNESIUM SULFATE 2 GM/50ML IV SOLN
2.0000 g | Freq: Once | INTRAVENOUS | Status: AC
  Administered 2019-11-29: 2 g via INTRAVENOUS
  Filled 2019-11-29: qty 50

## 2019-11-29 NOTE — Progress Notes (Signed)
[facility] ROUNDING NOTE   Subjective: Diarrhea is improving, not having liquid stool. She started on TPN, tolerating it well.  Overall feels better  Objective: Vital signs in last 24 hours: Temp:  [98.3 F (36.8 C)-98.8 F (37.1 C)] 98.3 F (36.8 C) (01/03 0802) Pulse Rate:  [56-75] 56 (01/03 0802) Resp:  [12-20] 12 (01/03 0802) BP: (148-179)/(67-81) 159/81 (01/03 0802) SpO2:  [96 %-100 %] 98 % (01/03 0802) Last BM Date: 11/27/19 General: NAD Lungs: clear Heart: s1s2 Abdomen: Soft no distention and nontender Ext: No edema PICC line in the left femoral    Intake/Output from previous day: No intake/output data recorded. Intake/Output this shift: No intake/output data recorded.   Lab Results: Recent Labs    11/27/19 0414 11/28/19 0700  WBC 21.9* 12.4*  HGB 8.7* 8.1*  PLT 108* 121*  MCV 93.8 97.0   BMET Recent Labs    11/27/19 0414 11/28/19 0700  NA 141 140  K 4.7 3.9  CL 111 112*  CO2 20* 20*  GLUCOSE 90 111*  BUN 42* 32*  CREATININE 1.75* 1.76*  CALCIUM 8.6* 8.6*   LFT Recent Labs    11/27/19 0414 11/28/19 0700  PROT 5.9* 6.5  ALBUMIN 2.9* 2.9*  AST 548* 163*  ALT 1,001* 638*  ALKPHOS 343* 332*  BILITOT 3.4* 1.3*  BILIDIR  --  0.7*  IBILI  --  0.6   PT/INR No results for input(s): INR in the last 72 hours.    Imaging/Other results: No results found.    Assessment &Plan  59 year old female with history of Crohn's disease s/p multiple surgeries at [facility], short gut syndrome on chronic TPN admitted with worsening abdominal pain, nausea, vomiting and diarrhea Obstructive jaundice with fever and leukocytosis concerning for ascending cholangitis S/p ERCP with sphincterotomy and sphincteroplasty. LFT trending down.  Total bili 1.3 and alk phos down to 332  Enterobacter bacteremia, on IV Unasyn and Flagyl Given difficult access, plan was to keep the PICC line in place per hospitalist team  Crohn's disease: Mild  bowel wall thickening noted on CT, currently not on any chronic maintenance therapy for Crohn's Holding off starting prednisone given cholangitis and bacteremia  Diarrhea improved with Entocort and Lomotil  Thrombocytopenia: Chronic, no splenomegaly or features of portal hypertension based on imaging Can follow-up with hematology as outpatient  Chronic anemia likely multifactorial, no overt GI bleeding  Chronic pain on methadone and Dilaudid  Follow-up with GI as outpatient on discharge  GI will sign off, available if have any questions    K. Denzil Magnuson , MD [phone]  [facility]

## 2019-11-29 NOTE — Progress Notes (Signed)
PHARMACY - TOTAL PARENTERAL NUTRITION CONSULT NOTE   Indication: Chronic TPN for Crohn's Disease  Patient Measurements: Height: 5' 8"  (172.7 cm) Weight: 123 lb 14.4 oz (56.2 kg) IBW/kg (Calculated) : 63.9 TPN AdjBW (KG): 56.2 Body mass index is 18.84 kg/m.  Assessment:  59 yo F presents with dehydration, worsening abdominal pain.  Hx of debilitating Crohns disease s/p small bowel resections. Has been on chronic TPN since 2003. More recently has been cycling TPN over 20 hrs. Hard stick and has PICC in left femoral. Also has enterobacter bacteremia. Pharmacy consulted to restart TPN.  Current PTA TPN ([facility] [phone]) 1874m over 20 hrs Dextrose 261 g Plenamine 90 g SMOFlipid 40 g Electrolytes -Na Acetate 945m -Potassium Phos 27 mMol -Ca gluconate 9 mEq -Mag Sulfate 16 mEq -Potassium acetate 51 mEq -Multitrace5conc 45m35mGlucose / Insulin: No hx of DM. CBGs < 180 on SSI Electrolytes: wnl exc Mg borderline low at 1.6 Renal: SCr down to 1.47. LFTs / TGs: LFTs elevated but trending down and Tbili elevated (2.4) potentially in part due to chronic TPN. Only has lipids in 2 TPN bags per week usually, will continue lower amounts of lipids while inpatient Prealbumin / albumin: Albumin 2.9, pre-albumin 22.4 Intake / Output; MIVF: 3x urine occurrences yesterday, still having diarrhea GI Imaging: 12/30 CT shows proctocolitis Surgeries / Procedures:  Significant bowel resections in the past  Central access: Single lumen PICC (Placed in October) TPN start date: PTA TPN > held > plan to restart 1/3  Nutritional Goals (per RD recommendation on **): KCal: 1800-2000, Protein: 90-100g, Fluid: > 2 L  Goal cyclic TPN is 1,87,121FXer 18 hrs. (provides 99 g of protein and 1,840 kcals per day)  Current Nutrition:  Soft diet for comfort  Plan:  Continue cyclic TPN. Infuse total volume of 1800 over 18 hours. Start at 74m52m x 1 hour, then increase to  106ml48mx 16 hours, then decrease back to 74ml/29m 1 hour Electrolytes in TPN: 50mEq/66m Na, increase to 40mEq/L85mK, 5mEq/L o50ma, increase to 10mEq/L o50m, and 15mmol/L o545mos. Cl:Ac ratio 1:1 Add standard MVI MWFand trace elements to TPN Continue custom Sensitive SSI and adjust as needed  Monitor TPN labs on Mon/Thurs F/U RD recs  Annalissa Murphey J 11/29/2019,10:26 AM

## 2019-11-29 NOTE — Progress Notes (Signed)
PROGRESS NOTE    Caitlin Peters  AVW:[id number] DOB: 1961/01/08 DOA: 11/25/2019 PCP: [name], [name], MD   Brief Narrative: Patient is a 59 year old female with history of debilitating Crohn's disease for 36 years requiring 7-8 surgeries, small bowel obstructions on multiple occasion, short gut syndrome, on chronic TPN since 2003, chronic pain with narcotic dependency who presented to the emergency department on 12/30 with complaints of worsening abdominal pain, nausea, vomiting and diarrhea.  CT abdomen/pelvis done in the emergency department was positive of pancolitis.  She was also febrile on presentation.  She was found to have worsening LFTs with ALT/AST in the range of 2K.  Found to have ascending cholangitis with Enterobacter bacteremia.  GI was consulted.  Underwent ERCP with sphincterotomy.  Overall status significantly improving.  Assessment & Plan:   Principal Problem:   Intractable abdominal pain Active Problems:   Crohn's disease ([place])   Diarrhea   Cholangitis   Generalized abdominal pain   Acute ascending colitis/Enterobacter bacteremia: Status post urgent ERCP with sphincterotomy, sphincteroplasty, no evidence of PSC or stricture noted.  Blood cultures showed Enterobacter.  Clinically improving.  Currently on IV levofloxacin and Flagyl.  Repeat blood cultures have been negative.Soft  diet has been tolerated.  IV fluids stopped.  TPN started. LFT trending down. Discussed with ID and recommended to continue IV Levaquin and change to oral when appropriate to complete course of total  10 days.  She has a left femoral PICC and she will be discharged with PICC line.  Severe Crohn's disease with proctocolitis: Continue IV Levaquin/Flagyl as above.  Currently on soft diet.  On chronic TPN.  Not a started on prednisone due to concern for sepsis/bacteremia.  On budesonide currently. She has severe debilitating Crohn's disease for last 36 years.  She has history of short  gut syndrome and is on chronic TPN since 2003.  Chronic pain syndrome/narcotic dependence: Continue home regimen of methadone and oral Dilaudid  Anemia of chronic disease: H&H stable.  Continue to monitor  Hypertension: Currently blood pressure stable.  On amlodipine at home.  Chronic kidney disease stage IIIa: Baseline creatinine around 1.7.  Currently kidney function stable  Severe protein calorie malnutrition: Continue TPN.  Diarrhea: Improved with Entocort, Lomotil.  Thrombocytopenia: Chronic.  Follow as an outpatient.        DVT prophylaxis:Heparin [place] Code Status: Full code Family Communication: None present at the bedside Disposition Plan: Home in next 48 to 72 hours   Consultants: None  Procedures: As above Antimicrobials:  Anti-infectives (From admission, onward)   Start     Dose/Rate Route Frequency Ordered Stop   11/26/19 2200  levofloxacin (LEVAQUIN) IVPB 750 mg     750 mg 100 mL/hr over 90 Minutes Intravenous Every 48 hours 11/26/19 1307     11/25/19 2315  ciprofloxacin (CIPRO) IVPB 400 mg  Status:  Discontinued     400 mg 200 mL/hr over 60 Minutes Intravenous 2 times daily 11/25/19 2302 11/26/19 1307   11/25/19 2300  metroNIDAZOLE (FLAGYL) IVPB 500 mg     500 mg 100 mL/hr over 60 Minutes Intravenous Every 8 hours 11/25/19 2257        Subjective:  Patient seen and examined at the bedside this morning.  Hemodynamically stable.  Looks very comfortable today.  Ate her breakfast.  Abdominal pain is well controlled.  Objective: Vitals:   11/28/19 2042 11/29/19 0022 11/29/19 0436 11/29/19 0802  BP: (!) 168/76 (!) 179/71 (!) 165/80 (!) 159/81  Pulse: 61  75 63 (!) 56  Resp: 20 18 19 12   Temp: 98.5 F (36.9 C) 98.7 F (37.1 C) 98.8 F (37.1 C) 98.3 F (36.8 C)  TempSrc: Oral Oral Oral   SpO2: 96% 100% 100% 98%  Weight:      Height:       No intake or output data in the 24 hours ending 11/29/19 1314 Filed Weights   11/25/19 1157 11/26/19 1418    Weight: 56.2 kg 56.2 kg    Examination:  General exam: Deconditioned, debilitated,Not in distress,average built HEENT:PERRL,Oral mucosa moist, Ear/Nose normal on gross exam Respiratory system: Bilateral equal air entry, normal vesicular breath sounds, no wheezes or crackles  Cardiovascular system: S1 & S2 heard, RRR. No JVD, murmurs, rubs, gallops or clicks. No pedal edema. Gastrointestinal system: Abdomen is nondistended, soft and has mild generalized tenderness. No organomegaly or masses felt. Normal bowel sounds heard. Central nervous system: Alert and oriented. No focal neurological deficits. Extremities: No edema, no clubbing ,no cyanosis, distal peripheral pulses palpable. Skin: No rashes, lesions or ulcers,no icterus ,no pallor   Data Reviewed: I have personally reviewed following labs and imaging studies  CBC: Recent Labs  Lab 11/25/19 1642 11/26/19 0209 11/26/19 0630 11/27/19 0414 11/28/19 0700 11/29/19 1025  WBC 4.4 7.6 16.8* 21.9* 12.4* 5.3  NEUTROABS 2.9  --   --  18.9* 9.7* 3.8  HGB 8.9* 8.4* 8.7* 8.7* 8.1* 9.1*  HCT 28.2* 26.3* 27.8* 27.2* 26.2* 29.4*  MCV 92.8 94.3 95.5 93.8 97.0 95.5  PLT 174 117* 122* 108* 121* 426*   Basic Metabolic Panel: Recent Labs  Lab 11/25/19 1642 11/25/19 2335 11/26/19 0630 11/27/19 0414 11/28/19 0700 11/29/19 1025  NA 138  --  136 141 140 138  K 4.0  --  4.0 4.7 3.9 3.8  CL 103  --  104 111 112* 110  CO2 26  --  20* 20* 20* 19*  GLUCOSE 98  --  90 90 111* 124*  BUN 39*  --  36* 42* 32* 26*  CREATININE 1.37* 1.41* 1.79* 1.75* 1.76* 1.47*  CALCIUM 9.4  --  8.7* 8.6* 8.6* 8.8*  MG  --   --   --   --   --  1.6*  PHOS  --   --   --   --   --  3.0   GFR: Estimated Creatinine Clearance: 37 mL/min (A) (by C-G formula based on SCr of 1.47 mg/dL (H)). Liver Function Tests: Recent Labs  Lab 11/25/19 1642 11/26/19 0630 11/27/19 0414 11/28/19 0700 11/29/19 1025  AST 14* 2,103* 548* 163* 115*  ALT 20 1,668* 1,001* 638*  448*  ALKPHOS 167* 443* 343* 332* 367*  BILITOT 0.9 2.4* 3.4* 1.3* 2.4*  PROT 7.2 6.2* 5.9* 6.5 6.6  ALBUMIN 3.5 2.8* 2.9* 2.9* 3.1*   Recent Labs  Lab 11/25/19 1642 11/28/19 0700  LIPASE 24 18   No results for input(s): AMMONIA in the last 168 hours. Coagulation Profile: No results for input(s): INR, PROTIME in the last 168 hours. Cardiac Enzymes: No results for input(s): CKTOTAL, CKMB, CKMBINDEX, TROPONINI in the last 168 hours. BNP (last 3 results) No results for input(s): PROBNP in the last 8760 hours. HbA1C: No results for input(s): HGBA1C in the last 72 hours. CBG: Recent Labs  Lab 11/28/19 2038 11/29/19 0015 11/29/19 0418 11/29/19 0758 11/29/19 1126  GLUCAP 96 102* 86 123* 128*   Lipid Profile: No results for input(s): CHOL, HDL, LDLCALC, TRIG, CHOLHDL, LDLDIRECT in the last 72  hours. Thyroid Function Tests: No results for input(s): TSH, T4TOTAL, FREET4, T3FREE, THYROIDAB in the last 72 hours. Anemia Panel: Recent Labs    11/27/19 1645  VITAMINB12 1,698*  FOLATE 19.8  FERRITIN 794*  TIBC 192*  IRON 169  RETICCTPCT 1.1   Sepsis Labs: Recent Labs  Lab 11/25/19 1642 11/25/19 2050 11/26/19 0209  LATICACIDVEN 0.5 2.1* 2.6*    Recent Results (from the past 240 hour(s))  Urine culture     Status: Abnormal   Collection Time: 11/25/19  4:42 PM   Specimen: Urine, Random  Result Value Ref Range Status   Specimen Description   Final    URINE, RANDOM Performed at [facility] [address] [place], [place] [address]    Special Requests   Final    NONE Performed at [facility], [place] [address] [place], [place] [address]    Culture (A)  Final    <10,000 COLONIES/mL INSIGNIFICANT GROWTH Performed at [facility] [address] [place], [place] [address]    Report Status 11/26/2019 FINAL  Final  SARS CORONAVIRUS 2 (TAT 6-24 HRS) Nasopharyngeal Nasopharyngeal Swab     Status: None   Collection Time:  11/25/19  4:42 PM   Specimen: Nasopharyngeal Swab  Result Value Ref Range Status   SARS Coronavirus 2 NEGATIVE NEGATIVE Final    Comment: (NOTE) SARS-CoV-2 target nucleic acids are NOT DETECTED. The SARS-CoV-2 RNA is generally detectable in upper and lower respiratory specimens during the acute phase of infection. Negative results do not preclude SARS-CoV-2 infection, do not rule out co-infections with other pathogens, and should not be used as the sole basis for treatment or other patient management decisions. Negative results must be combined with clinical observations, patient history, and epidemiological information. The expected result is Negative. Fact Sheet for Patients: SugarRoll.be Fact Sheet for Healthcare Providers: https://www.woods-mathews.com/ This test is not yet approved or cleared by the [place] FDA and  has been authorized for detection and/or diagnosis of SARS-CoV-2 by FDA under an Emergency Use Authorization (EUA). This EUA will remain  in effect (meaning this test can be used) for the duration of the COVID-19 declaration under Section 56 4(b)(1) of the Act, 21 U.S.C. section 360bbb-3(b)(1), unless the authorization is terminated or revoked sooner. Performed at [facility], [place] [address] [place], [place] [address]   C difficile quick scan w PCR reflex     Status: None   Collection Time: 11/25/19  8:50 PM   Specimen: STOOL  Result Value Ref Range Status   C Diff antigen NEGATIVE NEGATIVE Final   C Diff toxin NEGATIVE NEGATIVE Final   C Diff interpretation No C. difficile detected.  Final    Comment: Performed at [facility], [place] [address], [place], [place] [address]  GI pathogen panel by PCR, stool     Status: None   Collection Time: 11/25/19  8:50 PM   Specimen: STOOL  Result Value Ref Range Status   Plesiomonas shigelloides NOT DETECTED NOT DETECTED Final   Yersinia enterocolitica  NOT DETECTED NOT DETECTED Final   Vibrio NOT DETECTED NOT DETECTED Final   Enteropathogenic E coli NOT DETECTED NOT DETECTED Final   E coli (ETEC) LT/ST NOT DETECTED NOT DETECTED Final   E coli 4982 by PCR Not applicable NOT DETECTED Final   Cryptosporidium by PCR NOT DETECTED NOT DETECTED Final   Entamoeba histolytica NOT DETECTED NOT DETECTED Final   Adenovirus F 40/41 NOT DETECTED  NOT DETECTED Final   Norovirus GI/GII NOT DETECTED NOT DETECTED Final   Sapovirus NOT DETECTED NOT DETECTED Final    Comment: (NOTE) Performed At: [facility] [place], [place] [id number] Rush Farmer MD TD:[id number]    Vibrio cholerae NOT DETECTED NOT DETECTED Final   Campylobacter by PCR NOT DETECTED NOT DETECTED Final   Salmonella by PCR NOT DETECTED NOT DETECTED Final   E coli (STEC) NOT DETECTED NOT DETECTED Final   Enteroaggregative E coli NOT DETECTED NOT DETECTED Final   Shigella by PCR NOT DETECTED NOT DETECTED Final   Cyclospora cayetanensis NOT DETECTED NOT DETECTED Final   Astrovirus NOT DETECTED NOT DETECTED Final   G lamblia by PCR NOT DETECTED NOT DETECTED Final   Rotavirus A by PCR NOT DETECTED NOT DETECTED Final  Blood culture (routine x 2)     Status: Abnormal   Collection Time: 11/25/19  8:50 PM   Specimen: BLOOD  Result Value Ref Range Status   Specimen Description   Final    BLOOD PICC LINE Performed at [facility], [place] [address], [place], [place] [address]    Special Requests   Final    BOTTLES DRAWN AEROBIC AND ANAEROBIC Blood Culture adequate volume Performed at [facility] [address] [place], [place] [address]    Culture  Setup Time   Final    GRAM NEGATIVE RODS IN BOTH AEROBIC AND ANAEROBIC BOTTLES CRITICAL RESULT CALLED TO, READ BACK BY AND VERIFIED WITH: Christean Grief PharmD 12:35 11/26/19 ([email or web address]) Performed at [facility], [place] [address] [place], [place] [address]    Culture ENTEROBACTER  CLOACAE (A)  Final   Report Status 11/28/2019 FINAL  Final   Organism ID, Bacteria ENTEROBACTER CLOACAE  Final      Susceptibility   Enterobacter cloacae - MIC*    CEFAZOLIN RESISTANT Resistant     CEFEPIME <=0.12 SENSITIVE Sensitive     CEFTAZIDIME <=1 SENSITIVE Sensitive     CIPROFLOXACIN <=0.25 SENSITIVE Sensitive     GENTAMICIN <=1 SENSITIVE Sensitive     IMIPENEM <=0.25 SENSITIVE Sensitive     TRIMETH/SULFA <=20 SENSITIVE Sensitive     PIP/TAZO 8 SENSITIVE Sensitive     * ENTEROBACTER CLOACAE  Blood Culture ID Panel (Reflexed)     Status: Abnormal   Collection Time: 11/25/19  8:50 PM  Result Value Ref Range Status   Enterococcus species NOT DETECTED NOT DETECTED Final   Listeria monocytogenes NOT DETECTED NOT DETECTED Final   Staphylococcus species NOT DETECTED NOT DETECTED Final   Staphylococcus aureus (BCID) NOT DETECTED NOT DETECTED Final   Streptococcus species NOT DETECTED NOT DETECTED Final   Streptococcus agalactiae NOT DETECTED NOT DETECTED Final   Streptococcus pneumoniae NOT DETECTED NOT DETECTED Final   Streptococcus pyogenes NOT DETECTED NOT DETECTED Final   Acinetobacter baumannii NOT DETECTED NOT DETECTED Final   Enterobacteriaceae species DETECTED (A) NOT DETECTED Final    Comment: Enterobacteriaceae represent a large family of gram-negative bacteria, not a single organism. CRITICAL RESULT CALLED TO, READ BACK BY AND VERIFIED WITH: Christean Grief PharmD 12:35 11/26/19 ([email or web address])    Enterobacter cloacae complex DETECTED (A) NOT DETECTED Final    Comment: CRITICAL RESULT CALLED TO, READ BACK BY AND VERIFIED WITH: Christean Grief PharmD 12:35 11/26/19 ([email or web address])    Escherichia coli NOT DETECTED NOT DETECTED Final   Klebsiella oxytoca NOT DETECTED NOT DETECTED Final   Klebsiella pneumoniae NOT DETECTED NOT DETECTED Final   Proteus species NOT  DETECTED NOT DETECTED Final   Serratia marcescens NOT DETECTED NOT DETECTED Final   Carbapenem resistance NOT DETECTED NOT DETECTED  Final   Haemophilus influenzae NOT DETECTED NOT DETECTED Final   Neisseria meningitidis NOT DETECTED NOT DETECTED Final   Pseudomonas aeruginosa NOT DETECTED NOT DETECTED Final   Candida albicans NOT DETECTED NOT DETECTED Final   Candida glabrata NOT DETECTED NOT DETECTED Final   Candida krusei NOT DETECTED NOT DETECTED Final   Candida parapsilosis NOT DETECTED NOT DETECTED Final   Candida tropicalis NOT DETECTED NOT DETECTED Final    Comment: Performed at [facility], [place] [address] [place], [place] [address]  Blood culture (routine x 2)     Status: None (Preliminary result)   Collection Time: 11/26/19  6:30 AM   Specimen: BLOOD  Result Value Ref Range Status   Specimen Description   Final    BLOOD RIGHT ARM Performed at [facility] [address] [place], [place] [address]    Special Requests   Final    BOTTLES DRAWN AEROBIC ONLY Blood Culture adequate volume Performed at [facility] [address] [place], [place] [address]    Culture   Final    NO GROWTH 3 DAYS Performed at [facility], [place] [address] [place], [place] [address]    Report Status PENDING  Incomplete  Culture, blood (routine x 2)     Status: None (Preliminary result)   Collection Time: 11/27/19 10:02 AM   Specimen: Right Antecubital; Blood  Result Value Ref Range Status   Specimen Description   Final    RIGHT ANTECUBITAL Performed at [facility] [address] [place], [place] [address]    Special Requests   Final    BOTTLES DRAWN AEROBIC ONLY Blood Culture adequate volume Performed at [facility] [address], [place], [place] [address]    Culture   Final    NO GROWTH 2 DAYS Performed at [facility] [address] [facility], [place], [place] [address]    Report Status PENDING  Incomplete  Culture, blood (routine x 2)     Status: None (Preliminary result)   Collection Time: 11/27/19 10:02 AM    Specimen: Right Antecubital; Blood  Result Value Ref Range Status   Specimen Description   Final    RIGHT ANTECUBITAL Performed at [facility] [address], [place], [place] [address]    Special Requests   Final    BOTTLES DRAWN AEROBIC ONLY Blood Culture results may not be optimal due to an inadequate volume of blood received in culture bottles Performed at [facility] [address] [place], [place] [address]    Culture   Final    NO GROWTH 2 DAYS Performed at [facility] [address], [place], [place] [address]    Report Status PENDING  Incomplete         Radiology Studies: No results found.      Scheduled Meds: . budesonide  9 mg Oral Daily  . buPROPion  200 mg Oral Daily  . Chlorhexidine Gluconate Cloth  6 each Topical Daily  . cycloSPORINE  1 drop Both Eyes BID  . dicyclomine  20 mg Oral TID  . DULoxetine  90 mg Oral Daily  . estradiol  2 mg Oral Daily  . heparin  5,000 Units Subcutaneous Q8H  . insulin aspart  0-9 Units Subcutaneous 4 times per day  .  lipase/protease/amylase  108,000 Units Oral TID AC  . methadone  20 mg Oral Q12H  . metoprolol tartrate  100 mg Oral BID  . pantoprazole  40 mg Oral Daily   Continuous Infusions: . levofloxacin (LEVAQUIN) IV 750 mg (11/28/19 2300)  . magnesium sulfate bolus IVPB    . metronidazole 500 mg (11/29/19 0553)  . TPN CYCLIC-ADULT (ION) 47 mL/hr at 11/29/19 1227  . TPN CYCLIC-ADULT (ION)       LOS: 4 days    Time spent: 25 mins.More than 50% of that time was spent in counseling and/or coordination of care.      Shelly Coss, MD [facility] Pager [phone]  If 7PM-7AM, please contact night-coverage [email or web address] Password [email or web address] 11/29/2019, 1:14 PM

## 2019-11-29 NOTE — Evaluation (Signed)
Physical Therapy Evaluation Patient Details Name: Caitlin Peters MRN: [id number] DOB: Jan 29, 1961 Today's Date: 11/29/2019   History of Present Illness  59 yo female admitted with abd pain, colitis. Hx of Crohns  Clinical Impression  On eval, pt required Min assist for mobility. She walked ~350 feet around the unit. Did not use a walker today but she reports she usually uses a RW vs cane for ambulation safety. Discussed d/c plan-pt plans to return home. She is agreeable to HHPT f/u. Will continue to follow and progress activity as tolerated.     Follow Up Recommendations Home health PT    Equipment Recommendations  None recommended by PT    Recommendations for Other Services       Precautions / Restrictions Precautions Precautions: Fall Precaution Comments: history of multiple falls Restrictions Weight Bearing Restrictions: No      Mobility  Bed Mobility Overal bed mobility: Modified Independent                Transfers Overall transfer level: Needs assistance   Transfers: Sit to/from Stand Sit to Stand: Supervision            Ambulation/Gait Ambulation/Gait assistance: Min assist Gait Distance (Feet): 350 Feet Assistive device: None Gait Pattern/deviations: Drifts right/left     General Gait Details: Intermittent assist to steady. LOB x 1 when turning head to talk to me-assist given to prevent fall. Pt  Stairs            Wheelchair Mobility    Modified Rankin (Stroke Patients Only)       Balance Overall balance assessment: Needs assistance           Standing balance-Leahy Scale: Fair                               Pertinent Vitals/Pain Pain Assessment: Faces Faces Pain Scale: No hurt    Home Living Family/patient expects to be discharged to:: Private residence Living Arrangements: Children Available Help at Discharge: Family;Available 24 hours/day Type of Home: Apartment Home Access: Stairs to enter Entrance  Stairs-Rails: Right Entrance Stairs-Number of Steps: flight Home Layout: One level Home Equipment: Walker - 4 wheels;Cane - single point      Prior Function Level of Independence: Needs assistance   Gait / Transfers Assistance Needed: grandsons help her on stairs, she uses cane vs rollator independently for ambulation  ADL's / Homemaking Assistance Needed: reports family has watched her in the shower and been available since she moved here  Comments: was very active and independent prior to going off TPN, walked her dog 4-5 times a day     Hand Dominance        Extremity/Trunk Assessment   Upper Extremity Assessment Upper Extremity Assessment: Overall WFL for tasks assessed    Lower Extremity Assessment Lower Extremity Assessment: Generalized weakness    Cervical / Trunk Assessment Cervical / Trunk Assessment: Kyphotic  Communication   Communication: No difficulties  Cognition Arousal/Alertness: Awake/alert Behavior During Therapy: WFL for tasks assessed/performed Overall Cognitive Status: Within Functional Limits for tasks assessed                                        General Comments      Exercises     Assessment/Plan    PT Assessment Patient needs continued PT services  PT  Problem List Decreased balance;Decreased strength       PT Treatment Interventions Gait training;DME instruction;Therapeutic activities;Therapeutic exercise;Patient/family education;Balance training;Functional mobility training    PT Goals (Current goals can be found in the Care Plan section)  Acute Rehab PT Goals Patient Stated Goal: none stated PT Goal Formulation: With patient Time For Goal Achievement: 12/13/19 Potential to Achieve Goals: Good    Frequency Min 3X/week   Barriers to discharge        Co-evaluation               AM-PAC PT "6 Clicks" Mobility  Outcome Measure Help needed turning from your back to your side while in a flat bed without  using bedrails?: None Help needed moving from lying on your back to sitting on the side of a flat bed without using bedrails?: None Help needed moving to and from a bed to a chair (including a wheelchair)?: A Little Help needed standing up from a chair using your arms (e.g., wheelchair or bedside chair)?: A Little Help needed to walk in hospital room?: A Little Help needed climbing 3-5 steps with a railing? : A Little 6 Click Score: 20    End of Session Equipment Utilized During Treatment: Gait belt Activity Tolerance: Patient tolerated treatment well Patient left: in bed;with call bell/phone within reach   PT Visit Diagnosis: Unsteadiness on feet (R26.81);History of falling (Z91.81);Repeated falls (R29.6)    Time: 1443-1500 PT Time Calculation (min) (ACUTE ONLY): 17 min   Charges:   PT Evaluation $PT Eval Moderate Complexity: 1 Mod          Tomeko Scoville P,, PT Acute Rehabilitation    .

## 2019-11-29 NOTE — Progress Notes (Signed)
Initial Nutrition Assessment  INTERVENTION:   -Cyclic TPN management per Pharmacy -Continue soft diet for comfort  NUTRITION DIAGNOSIS:   Increased nutrient needs related to chronic illness(Crohn's disease, short gut syndrome) as evidenced by estimated needs.  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 Has been on chronic cyclic TPN at home.  Currently receiving ~1839 kcals and 99g protein from cyclic TPN x 18 hours.   Patient is on a soft diet for comfort only. Due to short gut syndrome, suspect pt is unable to absorb adequate nutrition via PO intakes and will continue to require TPN to maximize nutritional status.   Admission weight: 124 lbs.  Pt has lost 11 lbs since 12/2 (8% wt loss x 1 month, significant for time frame).   Medications: Creon capsule TID, IV Mg sulfate, IV Zofran PRN, IV Compazine, IV Phenergan  Labs reviewed: CBGs: 123-128 Phos WNL Low Mg TG: 32  NUTRITION - FOCUSED PHYSICAL EXAM:  Working remotely.  Diet Order:   Diet Order            DIET  SOFT Room service appropriate? Yes; Fluid consistency: Thin  Diet effective now              EDUCATION NEEDS:   No education needs have been identified at this time  Skin:  Skin Assessment: Reviewed RN Assessment  Last BM:  1/2 -type 7  Height:   Ht Readings from Last 1 Encounters:  11/25/19 5' 8"  (1.727 m)    Weight:   Wt Readings from Last 1 Encounters:  11/26/19 56.2 kg    Ideal Body Weight:  63.6 kg  BMI:  Body mass index is 18.84 kg/m.  Estimated Nutritional Needs:   Kcal:  1850-2050  Protein:  95-110g  Fluid:  2L/day  Clayton Bibles, MS, RD, LDN Inpatient Clinical Dietitian Pager: [phone] After Hours Pager: [phone]

## 2019-11-30 LAB — DIFFERENTIAL
Abs Immature Granulocytes: 0.15 10*3/uL — ABNORMAL HIGH (ref 0.00–0.07)
Basophils Absolute: 0 10*3/uL (ref 0.0–0.1)
Basophils Relative: 1 %
Eosinophils Absolute: 0.1 10*3/uL (ref 0.0–0.5)
Eosinophils Relative: 2 %
Immature Granulocytes: 4 %
Lymphocytes Relative: 24 %
Lymphs Abs: 1 10*3/uL (ref 0.7–4.0)
Monocytes Absolute: 0.3 10*3/uL (ref 0.1–1.0)
Monocytes Relative: 7 %
Neutro Abs: 2.8 10*3/uL (ref 1.7–7.7)
Neutrophils Relative %: 62 %

## 2019-11-30 LAB — COMPREHENSIVE METABOLIC PANEL
ALT: 299 U/L — ABNORMAL HIGH (ref 0–44)
AST: 51 U/L — ABNORMAL HIGH (ref 15–41)
Albumin: 2.9 g/dL — ABNORMAL LOW (ref 3.5–5.0)
Alkaline Phosphatase: 309 U/L — ABNORMAL HIGH (ref 38–126)
Anion gap: 7 (ref 5–15)
BUN: 33 mg/dL — ABNORMAL HIGH (ref 6–20)
CO2: 18 mmol/L — ABNORMAL LOW (ref 22–32)
Calcium: 9.1 mg/dL (ref 8.9–10.3)
Chloride: 111 mmol/L (ref 98–111)
Creatinine, Ser: 1.7 mg/dL — ABNORMAL HIGH (ref 0.44–1.00)
GFR calc Af Amer: 38 mL/min — ABNORMAL LOW (ref >60)
GFR calc non Af Amer: 33 mL/min — ABNORMAL LOW (ref >60)
Glucose, Bld: 114 mg/dL — ABNORMAL HIGH (ref 70–99)
Potassium: 4.4 mmol/L (ref 3.5–5.1)
Sodium: 136 mmol/L (ref 135–145)
Total Bilirubin: 1.2 mg/dL (ref 0.3–1.2)
Total Protein: 6.4 g/dL — ABNORMAL LOW (ref 6.5–8.1)

## 2019-11-30 LAB — TRIGLYCERIDES: Triglycerides: 24 mg/dL (ref <150)

## 2019-11-30 LAB — GLUCOSE, CAPILLARY
Glucose-Capillary: 115 mg/dL — ABNORMAL HIGH (ref 70–99)
Glucose-Capillary: 89 mg/dL (ref 70–99)
Glucose-Capillary: 104 mg/dL — ABNORMAL HIGH (ref 70–99)

## 2019-11-30 LAB — CBC
HCT: 26.9 % — ABNORMAL LOW (ref 36.0–46.0)
Hemoglobin: 8 g/dL — ABNORMAL LOW (ref 12.0–15.0)
MCH: 28.9 pg (ref 26.0–34.0)
MCHC: 29.7 g/dL — ABNORMAL LOW (ref 30.0–36.0)
MCV: 97.1 fL (ref 80.0–100.0)
Platelets: 108 10*3/uL — ABNORMAL LOW (ref 150–400)
RBC: 2.77 MIL/uL — ABNORMAL LOW (ref 3.87–5.11)
RDW: 14.9 % (ref 11.5–15.5)
WBC: 4.3 10*3/uL (ref 4.0–10.5)
nRBC: 0 % (ref 0.0–0.2)

## 2019-11-30 LAB — MAGNESIUM: Magnesium: 2.1 mg/dL (ref 1.7–2.4)

## 2019-11-30 LAB — PHOSPHORUS: Phosphorus: 3.8 mg/dL (ref 2.5–4.6)

## 2019-11-30 LAB — PREALBUMIN: Prealbumin: 22.2 mg/dL (ref 18–38)

## 2019-11-30 MED ORDER — METHADONE HCL 10 MG PO TABS: 20.0000 mg | ORAL_TABLET | Freq: Two times a day (BID) | ORAL | 0 refills | Status: DC

## 2019-11-30 MED ORDER — TRAVASOL 10 % IV SOLN
INTRAVENOUS | Status: DC
  Filled 2019-11-30: qty 990

## 2019-11-30 MED ORDER — LEVOFLOXACIN 750 MG PO TABS: 750.0000 mg | ORAL_TABLET | ORAL | 0 refills | Status: AC

## 2019-11-30 MED ORDER — HYDROMORPHONE HCL 4 MG PO TABS: 4.0000 mg | ORAL_TABLET | Freq: Four times a day (QID) | ORAL | 0 refills | Status: DC | PRN

## 2019-11-30 MED ORDER — METRONIDAZOLE 500 MG PO TABS: 500.0000 mg | ORAL_TABLET | Freq: Three times a day (TID) | ORAL | 0 refills | Status: AC

## 2019-11-30 MED ORDER — HEPARIN SOD (PORK) LOCK FLUSH 100 UNIT/ML IV SOLN
250.0000 [IU] | INTRAVENOUS | Status: AC | PRN
  Administered 2019-11-30: 250 [IU]
  Filled 2019-11-30: qty 2.5

## 2019-11-30 NOTE — TOC Progression Note (Addendum)
Transition of Care [facility]) - Progression Note    Patient Details  Name: HAELEE BOLEN MRN: [id number] Date of Birth: [birth year]  Transition of [facility]) CM/SW [place], [place] Phone Number: 11/30/2019, 11:39 AM  Clinical Narrative:    CSW met with the patient at bedside to discuss discharge plan home with continued TPN. Patient has been receiving TPN since 2003. Patient is active with [facility] for IV infusion and [facility] for RN. CSW reached out to [facility] to notify them of resumption orders and patient discharge today.  Patient reports she will need "additional bags". CSW notified Pam. Patient reports she has DME and declines physical therapy. She reports she walks daily with her grandchildren and will continue to do so.    Expected Discharge Plan: [place] Barriers to Discharge: No Barriers Identified   Expected Discharge Plan and Services Expected Discharge Plan: [place] In-house Referral: Clinical Social Work Discharge Planning Services: CM Consult Post Acute Care Choice: [place] arrangements for the past 2 months: [place] Expected Discharge Date: 11/30/19                         HH Arranged: TPN, RN [place] Agency: Tour manager, [facility] Date [place]: 11/30/19 Time [place]: [id number] Representative spoke with at [place]: [place] (SDOH) Interventions    Readmission Risk Interventions Readmission Risk Prevention Plan 11/30/2019 10/30/2019  Transportation Screening Complete Complete  PCP or Specialist Appt within 3-5 Days - Not Complete  Not Complete comments - pending d/c date to arrange  [place] or [place] - Complete  Social Work Consult for [place] Planning/Counseling - Not Complete  SW consult not completed comments - RNCM met with pt  Palliative Care Screening - Not Applicable   Medication Review Press photographer) Complete Complete  PCP or Specialist appointment within 3-5 days of discharge Complete -  [place] or Home Care Consult Complete -  SW Recovery Care/Counseling Consult Complete -  Palliative Care Screening Not Applicable -  [place] Not Applicable -

## 2019-11-30 NOTE — Discharge Summary (Signed)
Physician Discharge Summary  Caitlin Peters MLY:[id number] DOB: 1961-05-09 DOA: 11/25/2019  PCP: [name], [name], MD  Admit date: 11/25/2019 Discharge date: 11/30/2019  Admitted From: Home Disposition:  Home  Discharge Condition:Stable CODE STATUS:FULL Diet recommendation: Soft diet Brief/Interim Summary:  Patient is a 59 year old female with history of debilitating Crohn's disease for 36 years requiring 7-8 surgeries, small bowel obstructions on multiple occasion, short gut syndrome, on chronic TPN since 2003, chronic pain with narcotic dependency who presented to the emergency department on 12/30 with complaints of worsening abdominal pain, nausea, vomiting and diarrhea.  CT abdomen/pelvis done in the emergency department was positive of pancolitis.  She was also febrile on presentation.  She was found to have worsening LFTs with ALT/AST in the range of 2K.  Found to have ascending cholangitis with Enterobacter bacteremia.  GI was consulted.  Underwent ERCP with sphincterotomy.  Overall status significantly improved.  Abdominal pain has significantly improved.  Currently she is tolerating soft diet.  Hemodynamically stable.  She is stable for discharge to home today with oral antibiotics.  She needs to follow-up with her PCP and GI as an outpatient.  She is on TPN at home and has home health services arranged.  Following problems were addressed during hospitalization:  Acute ascending colitis/Enterobacter bacteremia: Status post urgent ERCP with sphincterotomy, sphincteroplasty, no evidence of PSC or stricture noted.  Blood cultures showed Enterobacter.  Clinically improving.  She was on IV levofloxacin and Flagyl.  Repeat blood cultures have been negative.Soft  diet has been tolerated.  IV fluids stopped.  TPN restarted. LFT trending down. Discussed with ID and recommended to continue IV Levaquin and change to oral when appropriate to complete course of total  10-14 days.  She has a  left femoral PICC and she will be discharged with PICC line.  Severe Crohn's disease with proctocolitis:   Currently on soft diet.  On chronic TPN.  Not a started on prednisone due to concern for sepsis/bacteremia.  On budesonide currently. She has severe debilitating Crohn's disease for last 36 years.  She has history of short gut syndrome and is on chronic TPN since 2003.Follow up with GI as soon as possible.  Chronic pain syndrome/narcotic dependence: Continue home regimen of methadone and oral Dilaudid  Anemia of chronic disease: H&H stable.  Continue to monitor  Hypertension: Currently blood pressure stable.  On amlodipine at home.  Chronic kidney disease stage IIIa: Baseline creatinine around 1.7.  Currently kidney function stable  Severe protein calorie malnutrition: Continue TPN.  Diarrhea: Improved with Entocort, Lomotil.  Thrombocytopenia: Chronic.  Follow as an outpatient.   Discharge Diagnoses:  Principal Problem:   Intractable abdominal pain Active Problems:   Crohn's disease ([place])   Diarrhea   Cholangitis   Generalized abdominal pain    Discharge Instructions  Discharge Instructions    Diet general   Complete by: As directed    soft   Discharge instructions   Complete by: As directed    1)Please take prescribed medications as instructed. 2)Follow up with your PCP in a week.  Do a CBC, BMP test during the follow-up 3)Follow up with your gastroenterologist on the next available appointment 4)Follow up with home health services.   Increase activity slowly   Complete by: As directed      Allergies as of 11/30/2019      Reactions   Cefepime Other (See Comments)   Neurotoxicity occurring in setting of AKI. Ceftriaxone tolerated during same admit   Gabapentin  Other (See Comments)   unknown   Hyoscyamine Hives, Swelling   Legs swelling Disorientation   Lyrica [pregabalin] Other (See Comments)   unknown   Meperidine Hives   Other reaction(s): GI  Upset Due to Chrones   Topamax [topiramate] Other (See Comments)   unknown   Fentanyl Rash   Morphine And Related Rash      Medication List    STOP taking these medications   Reclast 5 MG/100ML Soln injection Generic drug: zoledronic acid     TAKE these medications   acetaminophen 325 MG tablet Commonly known as: TYLENOL Take 650 mg by mouth every 6 (six) hours as needed for mild pain.   amLODipine 5 MG tablet Commonly known as: NORVASC Take 1 tablet (5 mg total) by mouth daily. What changed: how much to take   budesonide 3 MG 24 hr capsule Commonly known as: ENTOCORT EC Take 3 capsules (9 mg total) by mouth daily.   buPROPion 100 MG 12 hr tablet Commonly known as: WELLBUTRIN SR Take 200 mg by mouth daily.   calcium carbonate 1250 (500 Ca) MG chewable tablet Commonly known as: OS-CAL Chew 1 tablet by mouth 2 (two) times daily.   cholecalciferol 25 MCG (1000 UT) tablet Commonly known as: VITAMIN D3 Take 2,000 Units by mouth daily.   cycloSPORINE 0.05 % ophthalmic emulsion Commonly known as: RESTASIS Place 1 drop into both eyes 2 (two) times daily.   Dexilant 60 MG capsule Generic drug: dexlansoprazole Take 1 capsule (60 mg total) by mouth daily.   Dextran 70-Hypromellose 0.1-0.3 % Soln Place 1 drop into both eyes 4 (four) times daily.   dicyclomine 20 MG tablet Commonly known as: BENTYL Take 20 mg by mouth 3 (three) times daily.   diphenoxylate-atropine 2.5-0.025 MG tablet Commonly known as: LOMOTIL Take 1 tablet by mouth 4 (four) times daily as needed for diarrhea or loose stools.   DULoxetine 30 MG capsule Commonly known as: CYMBALTA Take 3 capsules (90 mg total) by mouth daily.   estradiol 2 MG tablet Commonly known as: ESTRACE Take 2 mg by mouth daily.   HYDROmorphone 4 MG tablet Commonly known as: DILAUDID Take 1 tablet (4 mg total) by mouth every 6 (six) hours as needed for moderate pain or severe pain.   levofloxacin 750 MG  tablet Commonly known as: Levaquin Take 1 tablet (750 mg total) by mouth every other day for 5 doses.   lipase/protease/amylase 36000 UNITS Cpep capsule Commonly known as: CREON Take 1 capsule (36,000 Units total) by mouth 3 (three) times daily before meals. What changed: how much to take   loperamide 2 MG capsule Commonly known as: IMODIUM Take 1 capsule (2 mg total) by mouth as needed for diarrhea or loose stools.   methadone 10 MG tablet Commonly known as: DOLOPHINE Take 2 tablets (20 mg total) by mouth every 12 (twelve) hours.   metoprolol tartrate 100 MG tablet Commonly known as: LOPRESSOR Take 100 mg by mouth 2 (two) times daily.   metroNIDAZOLE 500 MG tablet Commonly known as: Flagyl Take 1 tablet (500 mg total) by mouth 3 (three) times daily for 5 days.   MULTIVITAMIN ADULT PO Take 1 tablet by mouth daily.   potassium chloride SA 20 MEQ tablet Commonly known as: KLOR-CON Take 2 tablets (40 mEq total) by mouth daily.   PROBIOTIC-10 PO Take 1 capsule by mouth daily.   promethazine 25 MG tablet Commonly known as: PHENERGAN Take 1 tablet (25 mg total) by mouth every 6 (  six) hours as needed for nausea or vomiting.   sucralfate 1 g tablet Commonly known as: CARAFATE Take 1 tablet (1 g total) by mouth 4 (four) times daily -  before meals and at bedtime.   traZODone 100 MG tablet Commonly known as: DESYREL Take 2 tablets (200 mg total) by mouth at bedtime as needed for sleep.   vitamin B-12 100 MCG tablet Commonly known as: CYANOCOBALAMIN Take 100 mcg by mouth daily.      Follow-up Information    Thornton Park, MD Follow up on 12/29/2019.   Specialty: Gastroenterology Why: at 9:50am Contact information: [place] [address] [phone]        [name], [name], MD. Schedule an appointment as soon as possible for a visit in 1 week(s).   Specialty: Internal Medicine Contact information: [place]  [address] [phone]          Allergies  Allergen Reactions  . Cefepime Other (See Comments)    Neurotoxicity occurring in setting of AKI. Ceftriaxone tolerated during same admit  . Gabapentin Other (See Comments)    unknown  . Hyoscyamine Hives and Swelling    Legs swelling   Disorientation  . Lyrica [Pregabalin] Other (See Comments)    unknown  . Meperidine Hives    Other reaction(s): GI Upset Due to Chrones   . Topamax [Topiramate] Other (See Comments)    unknown  . Fentanyl Rash  . Morphine And Related Rash    Consultations:  GI   Procedures/Studies: CT ABDOMEN PELVIS W CONTRAST  Result Date: 11/25/2019 CLINICAL DATA:  Crohn's exacerbation. Abdominal pain, nausea, vomiting, diarrhea EXAM: CT ABDOMEN AND PELVIS WITH CONTRAST TECHNIQUE: Multidetector CT imaging of the abdomen and pelvis was performed using the standard protocol following bolus administration of intravenous contrast. CONTRAST:  11m OMNIPAQUE IOHEXOL 300 MG/ML  SOLN COMPARISON:  10/12/2019 FINDINGS: Lower chest: No acute abnormality. Hepatobiliary: Normal liver. Gallbladder is surgically absent. Intrahepatic and extrahepatic biliary ductal dilatation unchanged compared with the prior examination with the common bile duct measuring 12 mm unchanged from the prior exam. Pancreas: Unremarkable. No pancreatic ductal dilatation or surrounding inflammatory changes. Spleen: Normal in size without focal abnormality. Adrenals/Urinary Tract: Adrenal glands are unremarkable. Kidneys are normal, without renal calculi, focal lesion, or hydronephrosis. Bladder is unremarkable. Stomach/Bowel: Stomach is normal. Descending colon and sigmoid colon bowel wall thickening as well as rectal wall thickening as can be seen with proctocolitis secondary to an inflammatory etiology given the patient's history of Crohn's disease. Prior right hemicolectomy with small bowel anastomosis to the transverse colon. No pneumatosis,  pneumoperitoneum or portal venous gas. Vascular/Lymphatic: Normal caliber abdominal aorta with mild atherosclerosis. Left femoral vein central venous catheter with the tip in the IVC. No lymphadenopathy. Reproductive: Status post hysterectomy. No adnexal masses. Other: No abdominal wall hernia or abnormality. No abdominopelvic ascites. Musculoskeletal: No acute osseous abnormality. No aggressive osseous lesion. IMPRESSION: 1. Descending colon and sigmoid colon bowel wall thickening as well as rectal wall thickening as can be seen with proctocolitis secondary to an inflammatory etiology given the patient's history of Crohn's disease. 2.  Aortic Atherosclerosis (ICD10-I70.0). Electronically Signed   By: HKathreen Devoid  On: 11/25/2019 18:57   DG ERCP BILIARY & PANCREATIC DUCTS  Result Date: 11/26/2019 CLINICAL DATA:  ERCP with sphincterotomy. EXAM: ERCP TECHNIQUE: Multiple spot images obtained with the fluoroscopic device and submitted for interpretation post-procedure. FLUOROSCOPY TIME:  Fluoroscopy Time:  7 minutes and 1 second Number  of Acquired Spot Images: 17 COMPARISON:  CT dated 11/25/2019 FINDINGS: There is cannulation of the common bile duct with injection of contrast demonstrating intrahepatic and extrahepatic biliary ductal dilatation. The patient appears to have undergone balloon dilatation of the distal common bile duct and sphincter. IMPRESSION: ERCP as above. These images were submitted for radiologic interpretation only. Please see the procedural report for the amount of contrast and the fluoroscopy time utilized. Electronically Signed   By: Constance Holster M.D.   On: 11/26/2019 20:54   DG Hip [name] W or [place] Pelvis 1 View Left  Result Date: 11/25/2019 CLINICAL DATA:  PICC line placement EXAM: DG HIP (WITH OR WITHOUT PELVIS) 1V PORT LEFT COMPARISON:  October 12, 2019. FINDINGS: There is a central venous catheter projecting over the patient's left pelvis. The tip of this catheter is not  visualized on this exam. Exact positioning cannot be determined on this study. There is a sclerotic area within the left iliac bone, stable from prior study. IMPRESSION: PICC line not fully visualized on this exam. Exact positioning cannot be determined. Electronically Signed   By: Constance Holster M.D.   On: 11/25/2019 16:14       Subjective: Patient seen and examined at the bedside this morning.  Hemodynamically stable for discharge.  Discussed with daughter about discharge planning.  Discharge Exam: Vitals:   11/30/19 0627 11/30/19 0628  BP: (!) 150/72   Pulse: (!) 42 66  Resp: 20   Temp: 98.5 F (36.9 C)   SpO2: 98% 98%   Vitals:   11/30/19 0050 11/30/19 0300 11/30/19 0627 11/30/19 0628  BP: 132/66  (!) 150/72   Pulse: (!) 46 60 (!) 42 66  Resp: 20  20   Temp: 98.7 F (37.1 C)  98.5 F (36.9 C)   TempSrc:   Oral   SpO2: 97%  98% 98%  Weight:      Height:        General: Pt is alert, awake, not in acute distress Cardiovascular: RRR, S1/S2 +, no rubs, no gallops Respiratory: CTA bilaterally, no wheezing, no rhonchi Abdominal: Soft, NT, ND, bowel sounds + Extremities: no edema, no cyanosis    The results of significant diagnostics from this hospitalization (including imaging, microbiology, ancillary and laboratory) are listed below for reference.     Microbiology: Recent Results (from the past 240 hour(s))  Urine culture     Status: Abnormal   Collection Time: 11/25/19  4:42 PM   Specimen: Urine, Random  Result Value Ref Range Status   Specimen Description   Final    URINE, RANDOM Performed at [facility] [address], [place], [place] [address]    Special Requests   Final    NONE Performed at [facility], [place] [address] [place], [place] [address]    Culture (A)  Final    <10,000 COLONIES/mL INSIGNIFICANT GROWTH Performed at [facility] [address] [place], [place] [address]    Report Status  11/26/2019 FINAL  Final  SARS CORONAVIRUS 2 (TAT 6-24 HRS) Nasopharyngeal Nasopharyngeal Swab     Status: None   Collection Time: 11/25/19  4:42 PM   Specimen: Nasopharyngeal Swab  Result Value Ref Range Status   SARS Coronavirus 2 NEGATIVE NEGATIVE Final    Comment: (NOTE) SARS-CoV-2 target nucleic acids are NOT DETECTED. The SARS-CoV-2 RNA is generally detectable in upper and lower respiratory specimens during the acute phase of infection. Negative results do not preclude SARS-CoV-2  infection, do not rule out co-infections with other pathogens, and should not be used as the sole basis for treatment or other patient management decisions. Negative results must be combined with clinical observations, patient history, and epidemiological information. The expected result is Negative. Fact Sheet for Patients: SugarRoll.be Fact Sheet for Healthcare Providers: https://www.woods-mathews.com/ This test is not yet approved or cleared by the [place] FDA and  has been authorized for detection and/or diagnosis of SARS-CoV-2 by FDA under an Emergency Use Authorization (EUA). This EUA will remain  in effect (meaning this test can be used) for the duration of the COVID-19 declaration under Section 56 4(b)(1) of the Act, 21 U.S.C. section 360bbb-3(b)(1), unless the authorization is terminated or revoked sooner. Performed at [facility], [place] [address], [place], [place] [address]   C difficile quick scan w PCR reflex     Status: None   Collection Time: 11/25/19  8:50 PM   Specimen: STOOL  Result Value Ref Range Status   C Diff antigen NEGATIVE NEGATIVE Final   C Diff toxin NEGATIVE NEGATIVE Final   C Diff interpretation No C. difficile detected.  Final    Comment: Performed at [facility], [place] [address], [place], [place] [address]  GI pathogen panel by PCR, stool     Status: None   Collection Time: 11/25/19  8:50 PM    Specimen: STOOL  Result Value Ref Range Status   Plesiomonas shigelloides NOT DETECTED NOT DETECTED Final   Yersinia enterocolitica NOT DETECTED NOT DETECTED Final   Vibrio NOT DETECTED NOT DETECTED Final   Enteropathogenic E coli NOT DETECTED NOT DETECTED Final   E coli (ETEC) LT/ST NOT DETECTED NOT DETECTED Final   E coli 5638 by PCR Not applicable NOT DETECTED Final   Cryptosporidium by PCR NOT DETECTED NOT DETECTED Final   Entamoeba histolytica NOT DETECTED NOT DETECTED Final   Adenovirus F 40/41 NOT DETECTED NOT DETECTED Final   Norovirus GI/GII NOT DETECTED NOT DETECTED Final   Sapovirus NOT DETECTED NOT DETECTED Final    Comment: (NOTE) Performed At: [facility] [place], [place] [id number] Rush Farmer MD OT:[id number]    Vibrio cholerae NOT DETECTED NOT DETECTED Final   Campylobacter by PCR NOT DETECTED NOT DETECTED Final   Salmonella by PCR NOT DETECTED NOT DETECTED Final   E coli (STEC) NOT DETECTED NOT DETECTED Final   Enteroaggregative E coli NOT DETECTED NOT DETECTED Final   Shigella by PCR NOT DETECTED NOT DETECTED Final   Cyclospora cayetanensis NOT DETECTED NOT DETECTED Final   Astrovirus NOT DETECTED NOT DETECTED Final   G lamblia by PCR NOT DETECTED NOT DETECTED Final   Rotavirus A by PCR NOT DETECTED NOT DETECTED Final  Blood culture (routine x 2)     Status: Abnormal   Collection Time: 11/25/19  8:50 PM   Specimen: BLOOD  Result Value Ref Range Status   Specimen Description   Final    BLOOD PICC LINE Performed at [facility], [place] [address] [place], [place] [address]    Special Requests   Final    BOTTLES DRAWN AEROBIC AND ANAEROBIC Blood Culture adequate volume Performed at [facility] [address], [place], [place] [address]    Culture  Setup Time   Final    GRAM NEGATIVE RODS IN BOTH AEROBIC AND ANAEROBIC BOTTLES CRITICAL RESULT CALLED TO, READ BACK BY AND VERIFIED WITH: Christean Grief  PharmD 12:35 11/26/19 ([email or web address]) Performed  at [facility], [place] [address], [place], [place] [address]    Culture ENTEROBACTER CLOACAE (A)  Final   Report Status 11/28/2019 FINAL  Final   Organism ID, Bacteria ENTEROBACTER CLOACAE  Final      Susceptibility   Enterobacter cloacae - MIC*    CEFAZOLIN RESISTANT Resistant     CEFEPIME <=0.12 SENSITIVE Sensitive     CEFTAZIDIME <=1 SENSITIVE Sensitive     CIPROFLOXACIN <=0.25 SENSITIVE Sensitive     GENTAMICIN <=1 SENSITIVE Sensitive     IMIPENEM <=0.25 SENSITIVE Sensitive     TRIMETH/SULFA <=20 SENSITIVE Sensitive     PIP/TAZO 8 SENSITIVE Sensitive     * ENTEROBACTER CLOACAE  Blood Culture ID Panel (Reflexed)     Status: Abnormal   Collection Time: 11/25/19  8:50 PM  Result Value Ref Range Status   Enterococcus species NOT DETECTED NOT DETECTED Final   Listeria monocytogenes NOT DETECTED NOT DETECTED Final   Staphylococcus species NOT DETECTED NOT DETECTED Final   Staphylococcus aureus (BCID) NOT DETECTED NOT DETECTED Final   Streptococcus species NOT DETECTED NOT DETECTED Final   Streptococcus agalactiae NOT DETECTED NOT DETECTED Final   Streptococcus pneumoniae NOT DETECTED NOT DETECTED Final   Streptococcus pyogenes NOT DETECTED NOT DETECTED Final   Acinetobacter baumannii NOT DETECTED NOT DETECTED Final   Enterobacteriaceae species DETECTED (A) NOT DETECTED Final    Comment: Enterobacteriaceae represent a large family of gram-negative bacteria, not a single organism. CRITICAL RESULT CALLED TO, READ BACK BY AND VERIFIED WITH: Christean Grief PharmD 12:35 11/26/19 ([email or web address])    Enterobacter cloacae complex DETECTED (A) NOT DETECTED Final    Comment: CRITICAL RESULT CALLED TO, READ BACK BY AND VERIFIED WITH: Christean Grief PharmD 12:35 11/26/19 ([email or web address])    Escherichia coli NOT DETECTED NOT DETECTED Final   Klebsiella oxytoca NOT DETECTED NOT DETECTED Final   Klebsiella pneumoniae NOT DETECTED NOT DETECTED Final   Proteus species NOT  DETECTED NOT DETECTED Final   Serratia marcescens NOT DETECTED NOT DETECTED Final   Carbapenem resistance NOT DETECTED NOT DETECTED Final   Haemophilus influenzae NOT DETECTED NOT DETECTED Final   Neisseria meningitidis NOT DETECTED NOT DETECTED Final   Pseudomonas aeruginosa NOT DETECTED NOT DETECTED Final   Candida albicans NOT DETECTED NOT DETECTED Final   Candida glabrata NOT DETECTED NOT DETECTED Final   Candida krusei NOT DETECTED NOT DETECTED Final   Candida parapsilosis NOT DETECTED NOT DETECTED Final   Candida tropicalis NOT DETECTED NOT DETECTED Final    Comment: Performed at [facility], [place] [address], [place], [place] [address]  Blood culture (routine x 2)     Status: None (Preliminary result)   Collection Time: 11/26/19  6:30 AM   Specimen: BLOOD  Result Value Ref Range Status   Specimen Description   Final    BLOOD RIGHT ARM Performed at [facility] [address], [place], [place] [address]    Special Requests   Final    BOTTLES DRAWN AEROBIC ONLY Blood Culture adequate volume Performed at [facility] [address], [place], [place] [address]    Culture   Final    NO GROWTH 3 DAYS Performed at [facility], [place] [address], [place], [place] [address]    Report Status PENDING  Incomplete  Culture, blood (routine x 2)     Status: None (Preliminary result)   Collection Time: 11/27/19 10:02 AM   Specimen: Right Antecubital; Blood  Result Value Ref  Range Status   Specimen Description   Final    RIGHT ANTECUBITAL Performed at [facility] [address] [place], [place] [address]    Special Requests   Final    BOTTLES DRAWN AEROBIC ONLY Blood Culture adequate volume Performed at [facility] [address] [place], [place] [address]    Culture   Final    NO GROWTH 2 DAYS Performed at [facility] [address], [place], [place] [address]    Report Status  PENDING  Incomplete  Culture, blood (routine x 2)     Status: None (Preliminary result)   Collection Time: 11/27/19 10:02 AM   Specimen: Right Antecubital; Blood  Result Value Ref Range Status   Specimen Description   Final    RIGHT ANTECUBITAL Performed at [facility] [address] [place], [place] [address]    Special Requests   Final    BOTTLES DRAWN AEROBIC ONLY Blood Culture results may not be optimal due to an inadequate volume of blood received in culture bottles Performed at [facility] [address] [place], [place] [address]    Culture   Final    NO GROWTH 2 DAYS Performed at [facility] [address] [place], [place] [address]    Report Status PENDING  Incomplete     Labs: BNP (last 3 results) Recent Labs    10/12/19 2059 10/28/19 0435 11/26/19 0209  BNP 253.9* 162.4* 203.5*   Basic Metabolic Panel: Recent Labs  Lab 11/26/19 0630 11/27/19 0414 11/28/19 0700 11/29/19 1025 11/30/19 0554  NA 136 141 140 138 136  K 4.0 4.7 3.9 3.8 4.4  CL 104 111 112* 110 111  CO2 20* 20* 20* 19* 18*  GLUCOSE 90 90 111* 124* 114*  BUN 36* 42* 32* 26* 33*  CREATININE 1.79* 1.75* 1.76* 1.47* 1.70*  CALCIUM 8.7* 8.6* 8.6* 8.8* 9.1  MG  --   --   --  1.6* 2.1  PHOS  --   --   --  3.0 3.8   Liver Function Tests: Recent Labs  Lab 11/26/19 0630 11/27/19 0414 11/28/19 0700 11/29/19 1025 11/30/19 0554  AST 2,103* 548* 163* 115* 51*  ALT 1,668* 1,001* 638* 448* 299*  ALKPHOS 443* 343* 332* 367* 309*  BILITOT 2.4* 3.4* 1.3* 2.4* 1.2  PROT 6.2* 5.9* 6.5 6.6 6.4*  ALBUMIN 2.8* 2.9* 2.9* 3.1* 2.9*   Recent Labs  Lab 11/25/19 1642 11/28/19 0700  LIPASE 24 18   No results for input(s): AMMONIA in the last 168 hours. CBC: Recent Labs  Lab 11/25/19 1642 11/26/19 0630 11/27/19 0414 11/28/19 0700 11/29/19 1025 11/30/19 0554  WBC 4.4 16.8* 21.9* 12.4* 5.3 4.3  NEUTROABS 2.9  --  18.9* 9.7* 3.8 2.8  HGB 8.9* 8.7*  8.7* 8.1* 9.1* 8.0*  HCT 28.2* 27.8* 27.2* 26.2* 29.4* 26.9*  MCV 92.8 95.5 93.8 97.0 95.5 97.1  PLT 174 122* 108* 121* 113* 108*   Cardiac Enzymes: No results for input(s): CKTOTAL, CKMB, CKMBINDEX, TROPONINI in the last 168 hours. BNP: Invalid input(s): POCBNP CBG: Recent Labs  Lab 11/29/19 1126 11/29/19 1614 11/29/19 2140 11/30/19 0624 11/30/19 0804  GLUCAP 128* 105* 113* 115* 89   D-Dimer No results for input(s): DDIMER in the last 72 hours. Hgb A1c No results for input(s): HGBA1C in the last 72 hours. Lipid Profile Recent Labs    11/29/19 1025 11/30/19 0554  TRIG 32 24  Thyroid function studies No results for input(s): TSH, T4TOTAL, T3FREE, THYROIDAB in the last 72 hours.  Invalid input(s): FREET3 Anemia work up Recent Labs    11/27/19 1645  VITAMINB12 1,698*  FOLATE 19.8  FERRITIN 794*  TIBC 192*  IRON 169  RETICCTPCT 1.1   Urinalysis    Component Value Date/Time   COLORURINE YELLOW 11/25/2019 1642   APPEARANCEUR HAZY (A) 11/25/2019 1642   LABSPEC 1.021 11/25/2019 1642   PHURINE 6.0 11/25/2019 1642   GLUCOSEU NEGATIVE 11/25/2019 1642   HGBUR NEGATIVE 11/25/2019 1642   [place] 11/25/2019 1642   [place] 11/25/2019 1642   PROTEINUR 30 (A) 11/25/2019 1642   NITRITE NEGATIVE 11/25/2019 1642   LEUKOCYTESUR NEGATIVE 11/25/2019 1642   Sepsis Labs Invalid input(s): PROCALCITONIN,  WBC,  [facility] Microbiology Recent Results (from the past 240 hour(s))  Urine culture     Status: Abnormal   Collection Time: 11/25/19  4:42 PM   Specimen: Urine, Random  Result Value Ref Range Status   Specimen Description   Final    URINE, RANDOM Performed at [facility], [place] [address] [place], [place] [address]    Special Requests   Final    NONE Performed at [facility], [place] [address], [place], [place] [address]    Culture (A)  Final    <10,000 COLONIES/mL INSIGNIFICANT GROWTH Performed at  [place] [address], [place], [place] [address]    Report Status 11/26/2019 FINAL  Final  SARS CORONAVIRUS 2 (TAT 6-24 HRS) Nasopharyngeal Nasopharyngeal Swab     Status: None   Collection Time: 11/25/19  4:42 PM   Specimen: Nasopharyngeal Swab  Result Value Ref Range Status   SARS Coronavirus 2 NEGATIVE NEGATIVE Final    Comment: (NOTE) SARS-CoV-2 target nucleic acids are NOT DETECTED. The SARS-CoV-2 RNA is generally detectable in upper and lower respiratory specimens during the acute phase of infection. Negative results do not preclude SARS-CoV-2 infection, do not rule out co-infections with other pathogens, and should not be used as the sole basis for treatment or other patient management decisions. Negative results must be combined with clinical observations, patient history, and epidemiological information. The expected result is Negative. Fact Sheet for Patients: SugarRoll.be Fact Sheet for Healthcare Providers: https://www.woods-mathews.com/ This test is not yet approved or cleared by the [place] FDA and  has been authorized for detection and/or diagnosis of SARS-CoV-2 by FDA under an Emergency Use Authorization (EUA). This EUA will remain  in effect (meaning this test can be used) for the duration of the COVID-19 declaration under Section 56 4(b)(1) of the Act, 21 U.S.C. section 360bbb-3(b)(1), unless the authorization is terminated or revoked sooner. Performed at [facility], [place] [address], [place], [place] [address]   C difficile quick scan w PCR reflex     Status: None   Collection Time: 11/25/19  8:50 PM   Specimen: STOOL  Result Value Ref Range Status   C Diff antigen NEGATIVE NEGATIVE Final   C Diff toxin NEGATIVE NEGATIVE Final   C Diff interpretation No C. difficile detected.  Final    Comment: Performed at [facility], [place] [address], [place], [place] [address]  GI  pathogen panel by PCR, stool     Status: None   Collection Time: 11/25/19  8:50 PM   Specimen: STOOL  Result Value Ref Range Status   Plesiomonas shigelloides NOT DETECTED NOT DETECTED Final   Yersinia enterocolitica NOT DETECTED  NOT DETECTED Final   Vibrio NOT DETECTED NOT DETECTED Final   Enteropathogenic E coli NOT DETECTED NOT DETECTED Final   E coli (ETEC) LT/ST NOT DETECTED NOT DETECTED Final   E coli 5465 by PCR Not applicable NOT DETECTED Final   Cryptosporidium by PCR NOT DETECTED NOT DETECTED Final   Entamoeba histolytica NOT DETECTED NOT DETECTED Final   Adenovirus F 40/41 NOT DETECTED NOT DETECTED Final   Norovirus GI/GII NOT DETECTED NOT DETECTED Final   Sapovirus NOT DETECTED NOT DETECTED Final    Comment: (NOTE) Performed At: [facility] [place], [place] [id number] Rush Farmer MD EX:[id number]    Vibrio cholerae NOT DETECTED NOT DETECTED Final   Campylobacter by PCR NOT DETECTED NOT DETECTED Final   Salmonella by PCR NOT DETECTED NOT DETECTED Final   E coli (STEC) NOT DETECTED NOT DETECTED Final   Enteroaggregative E coli NOT DETECTED NOT DETECTED Final   Shigella by PCR NOT DETECTED NOT DETECTED Final   Cyclospora cayetanensis NOT DETECTED NOT DETECTED Final   Astrovirus NOT DETECTED NOT DETECTED Final   G lamblia by PCR NOT DETECTED NOT DETECTED Final   Rotavirus A by PCR NOT DETECTED NOT DETECTED Final  Blood culture (routine x 2)     Status: Abnormal   Collection Time: 11/25/19  8:50 PM   Specimen: BLOOD  Result Value Ref Range Status   Specimen Description   Final    BLOOD PICC LINE Performed at [facility], [place] [address], [place], [place] [address]    Special Requests   Final    BOTTLES DRAWN AEROBIC AND ANAEROBIC Blood Culture adequate volume Performed at [place] [address], [place], [place] [address]    Culture  Setup Time   Final    GRAM NEGATIVE RODS IN BOTH AEROBIC AND  ANAEROBIC BOTTLES CRITICAL RESULT CALLED TO, READ BACK BY AND VERIFIED WITH: Christean Grief PharmD 12:35 11/26/19 ([email or web address]) Performed at [facility], [place] [address], [place], [place] [address]    Culture ENTEROBACTER CLOACAE (A)  Final   Report Status 11/28/2019 FINAL  Final   Organism ID, Bacteria ENTEROBACTER CLOACAE  Final      Susceptibility   Enterobacter cloacae - MIC*    CEFAZOLIN RESISTANT Resistant     CEFEPIME <=0.12 SENSITIVE Sensitive     CEFTAZIDIME <=1 SENSITIVE Sensitive     CIPROFLOXACIN <=0.25 SENSITIVE Sensitive     GENTAMICIN <=1 SENSITIVE Sensitive     IMIPENEM <=0.25 SENSITIVE Sensitive     TRIMETH/SULFA <=20 SENSITIVE Sensitive     PIP/TAZO 8 SENSITIVE Sensitive     * ENTEROBACTER CLOACAE  Blood Culture ID Panel (Reflexed)     Status: Abnormal   Collection Time: 11/25/19  8:50 PM  Result Value Ref Range Status   Enterococcus species NOT DETECTED NOT DETECTED Final   Listeria monocytogenes NOT DETECTED NOT DETECTED Final   Staphylococcus species NOT DETECTED NOT DETECTED Final   Staphylococcus aureus (BCID) NOT DETECTED NOT DETECTED Final   Streptococcus species NOT DETECTED NOT DETECTED Final   Streptococcus agalactiae NOT DETECTED NOT DETECTED Final   Streptococcus pneumoniae NOT DETECTED NOT DETECTED Final   Streptococcus pyogenes NOT DETECTED NOT DETECTED Final   Acinetobacter baumannii NOT DETECTED NOT DETECTED Final   Enterobacteriaceae species DETECTED (A) NOT DETECTED Final    Comment: Enterobacteriaceae represent a large family of gram-negative bacteria, not a single organism. CRITICAL RESULT CALLED TO, READ BACK BY AND VERIFIED WITH:  Christean Grief PharmD 12:35 11/26/19 ([email or web address])    Enterobacter cloacae complex DETECTED (A) NOT DETECTED Final    Comment: CRITICAL RESULT CALLED TO, READ BACK BY AND VERIFIED WITH: Christean Grief PharmD 12:35 11/26/19 (wilsonm)    Escherichia coli NOT DETECTED NOT DETECTED Final   Klebsiella oxytoca NOT DETECTED NOT DETECTED  Final   Klebsiella pneumoniae NOT DETECTED NOT DETECTED Final   Proteus species NOT DETECTED NOT DETECTED Final   Serratia marcescens NOT DETECTED NOT DETECTED Final   Carbapenem resistance NOT DETECTED NOT DETECTED Final   Haemophilus influenzae NOT DETECTED NOT DETECTED Final   Neisseria meningitidis NOT DETECTED NOT DETECTED Final   Pseudomonas aeruginosa NOT DETECTED NOT DETECTED Final   Candida albicans NOT DETECTED NOT DETECTED Final   Candida glabrata NOT DETECTED NOT DETECTED Final   Candida krusei NOT DETECTED NOT DETECTED Final   Candida parapsilosis NOT DETECTED NOT DETECTED Final   Candida tropicalis NOT DETECTED NOT DETECTED Final    Comment: Performed at [facility], [place] [address], [place], [place] [address]  Blood culture (routine x 2)     Status: None (Preliminary result)   Collection Time: 11/26/19  6:30 AM   Specimen: BLOOD  Result Value Ref Range Status   Specimen Description   Final    BLOOD RIGHT ARM Performed at [place] [address], [place], [place] [address]    Special Requests   Final    BOTTLES DRAWN AEROBIC ONLY Blood Culture adequate volume Performed at [place] [address] [place], [place] [address]    Culture   Final    NO GROWTH 3 DAYS Performed at [facility], [place] [address] [place], [place] [address]    Report Status PENDING  Incomplete  Culture, blood (routine x 2)     Status: None (Preliminary result)   Collection Time: 11/27/19 10:02 AM   Specimen: Right Antecubital; Blood  Result Value Ref Range Status   Specimen Description   Final    RIGHT ANTECUBITAL Performed at [place] [address], [place], [place] [address]    Special Requests   Final    BOTTLES DRAWN AEROBIC ONLY Blood Culture adequate volume Performed at [place] [address], [place], [place] [address]    Culture   Final    NO GROWTH 2 DAYS Performed at  [place] [address] [place], [place] [address]    Report Status PENDING  Incomplete  Culture, blood (routine x 2)     Status: None (Preliminary result)   Collection Time: 11/27/19 10:02 AM   Specimen: Right Antecubital; Blood  Result Value Ref Range Status   Specimen Description   Final    RIGHT ANTECUBITAL Performed at [place] [address], [place], [place] [address]    Special Requests   Final    BOTTLES DRAWN AEROBIC ONLY Blood Culture results may not be optimal due to an inadequate volume of blood received in culture bottles Performed at [place] [address], [place], [place] [address]    Culture   Final    NO GROWTH 2 DAYS Performed at [place] [address], [place], [place] [address]    Report Status PENDING  Incomplete    Please note: You were cared for by a hospitalist during your hospital stay. Once you are discharged, your primary care physician will handle any further  medical issues. Please note that NO REFILLS for any discharge medications will be authorized once you are discharged, as it is imperative that you return to your primary care physician (or establish a relationship with a primary care physician if you do not have one) for your post hospital discharge needs so that they can reassess your need for medications and monitor your lab values.    Time coordinating discharge: 40 minutes  SIGNED:   Shelly Coss, MD  [facility] 11/30/2019, 11:08 AM Pager [phone]  If 7PM-7AM, please contact night-coverage [email or web address] Password [email or web address]

## 2019-11-30 NOTE — Progress Notes (Signed)
PHARMACY - TOTAL PARENTERAL NUTRITION CONSULT NOTE   Indication: Chronic TPN for Crohn's Disease  Patient Measurements: Height: 5' 8"  (172.7 cm) Weight: 123 lb 14.4 oz (56.2 kg) IBW/kg (Calculated) : 63.9 TPN AdjBW (KG): 56.2 Body mass index is 18.84 kg/m.  Assessment:  59 yo F presents with dehydration, worsening abdominal pain.  Hx of debilitating Crohns disease s/p small bowel resections. Has been on chronic TPN since 2003. More recently has been cycling TPN over 20 hrs. Hard stick and has PICC in left femoral. Also has enterobacter bacteremia on levofloxacin. Pharmacy consulted to restart TPN on 1/2.  Current PTA TPN ([facility] [id number]) 1863m over 20 hrs Dextrose 261 g Plenamine 90 g SMOFlipid 40 g Electrolytes -Na Acetate 962m -Potassium Phos 27 mMol -Ca gluconate 9 mEq -Mag Sulfate 16 mEq -Potassium acetate 51 mEq -Multitrace5conc 32m8mGlucose / Insulin: No hx of DM. CBGs range 89 - 124. Goal 80-150. 1 unit SSI/24 hrs. Electrolytes: WNL including corrected Ca of 10 Renal: SCr 1.7 appears to be at baseline. BUN 33. LFTs / TGs: LFTs remain elevated but are trending down.  Tbili (1.2) trended down to WNL. Only has lipids in 2 TPN bags per week usually, will continue lower amounts of lipids while inpatient Prealbumin / albumin: Albumin 2.9 (low), pre-albumin 22.2 (WNL) Intake / Output; MIVF: 8x urine occurrences yesterday GI Imaging: 12/30 CT shows proctocolitis Surgeries / Procedures:  Significant bowel resections in the past 12/31: ERCP  Central access: Single lumen PICC (Placed in October) TPN start date: PTA TPN > held > was resumed on 11/28/19  Nutritional Goals (per RD recommendation on 1/3): KCal: 1850-2050, Protein: 95-110g, Fluid: 2 L/day  Goal cyclic TPN is 1,86,244 over 18 hrs. (provides 99 g of protein and 1,840 kcals per day)  Current Nutrition:  Soft diet for comfort, TPN  Plan:   Continue cyclic TPN. Infuse total  volume of 1800 over 18 hours. Start at 75m18m x 1 hour, then increase to 106ml68mx 16 hours, then decrease back to 75ml/76m 1 hour then stop  Electrolytes in TPN: Decrease Ca, no other changes.  50mEq/37m Na, 40mEq/L28mK, 4mEq/L o10ma, 10mEq/L o68m, and 15mmol/L o82mos. Cl:Ac ratio 1:2 (change)  Add standard MVI MWF and trace elements to TPN  Continue custom Sensitive SSI (@ 0600, 1300, 1600, @ 2000) and adjust as needed   Monitor TPN labs on Mon/Thurs, and as needed  Jumanah Hynson M SwayLenis Noon4/2021,9:59 AM

## 2019-11-30 NOTE — Care Management Important Message (Signed)
Important Message  Patient Details IM Letter given to [place] Case Manager to present to the Patient Name: Caitlin Peters MRN: [id number] Date of Birth: 07-02-1961   Medicare Important Message Given:  Yes     [name] 11/30/2019, 11:41 AM

## 2019-12-01 ENCOUNTER — Telehealth: Payer: Self-pay

## 2019-12-01 ENCOUNTER — Telehealth: Payer: Self-pay | Admitting: *Deleted

## 2019-12-01 LAB — CULTURE, BLOOD (ROUTINE X 2)
Culture: NO GROWTH
Special Requests: ADEQUATE

## 2019-12-01 NOTE — Telephone Encounter (Signed)
Transition Care Management Follow-up Telephone Call  Date of discharge and from where: 11/30/19 from [place]   How have you been since you were released from the hospital? "I'm feeling a lot better than I have in a long time and those people over there pampered me".   Any questions or concerns? Yes ; concern about running out of pain medication. Message sent to [place] for Dr. Jerilee Hoh and message sent to clinical manager regarding communication issue at patient request.  Items Reviewed:  Did the pt receive and understand the discharge instructions provided? Yes   Medications obtained and verified? Yes   Any new allergies since your discharge? No   Dietary orders reviewed? Yes  Do you have support at home? Yes   Other (ie: DME, Home Health, etc) yes   Functional Questionnaire: (I = Independent and D = Dependent) ADL's: Patient is able to perform most ADL's independently but she lives with her daughter and she is there to help if needed.   Bathing/Dressing-    Meal Prep-   Eating-   Maintaining continence-   Transferring/Ambulation-   Managing Meds-    Follow up appointments reviewed:    PCP Hospital f/u appt confirmed? Yes  Scheduled for telephone visit with Dr. Jerilee Hoh 12/15/19 8:00am.  [facility] f/u appt confirmed? Yes  Scheduled to gastroenterology Dr. Tarri Glenn 12/29/19 9:50am.  Are transportation arrangements needed? No   If their condition worsens, is the pt aware to call  their PCP or go to the ED? Yes  Was the patient provided with contact information for the PCP's office or ED? Yes  Was the pt encouraged to call back with questions or concerns? Yes

## 2019-12-01 NOTE — Telephone Encounter (Signed)
Caitlin Peters is aware.

## 2019-12-01 NOTE — Telephone Encounter (Signed)
Copied from [place] [id number]. Topic: General - Inquiry >> Dec 01, 2019 10:15 AM Richardo Priest, NT wrote: Reason for CRM: [name] called in stating she wanted to notify PCP that they have resumed care on patient. Please advise. Call back is [phone].

## 2019-12-01 NOTE — Telephone Encounter (Signed)
Ok

## 2019-12-02 ENCOUNTER — Telehealth: Payer: Self-pay | Admitting: *Deleted

## 2019-12-02 LAB — CULTURE, BLOOD (ROUTINE X 2)
Culture: NO GROWTH
Culture: NO GROWTH
Special Requests: ADEQUATE

## 2019-12-02 NOTE — Telephone Encounter (Signed)
-----   Message from Christiane Ha, LPN sent at 2/0/9106  1:56 PM EST ----- Regarding: pain medications Please refer to telephone message note 11/23/19 where Dr. Jerilee Hoh stated "ok to pend for 1 mo". 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 She uses 4/day of each. She has not heard from any pain management office yet for a new patient appointment. Could you please take care of this? Thank you.

## 2019-12-03 ENCOUNTER — Encounter: Payer: Self-pay | Admitting: Internal Medicine

## 2019-12-05 ENCOUNTER — Encounter (HOSPITAL_COMMUNITY): Payer: Self-pay | Admitting: Emergency Medicine

## 2019-12-05 ENCOUNTER — Emergency Department (HOSPITAL_COMMUNITY)
Admission: EM | Admit: 2019-12-05 | Discharge: 2019-12-06 | Disposition: A | Discharge status: Home / Self Care | Payer: Medicare Other | Attending: Emergency Medicine | Admitting: Emergency Medicine

## 2019-12-05 ENCOUNTER — Other Ambulatory Visit: Payer: Self-pay

## 2019-12-05 DIAGNOSIS — K625 Hemorrhage of anus and rectum: Secondary | ICD-10-CM | POA: Diagnosis present

## 2019-12-05 DIAGNOSIS — R197 Diarrhea, unspecified: Secondary | ICD-10-CM | POA: Diagnosis not present

## 2019-12-05 DIAGNOSIS — Z87891 Personal history of nicotine dependence: Secondary | ICD-10-CM | POA: Diagnosis not present

## 2019-12-05 DIAGNOSIS — Z8719 Personal history of other diseases of the digestive system: Secondary | ICD-10-CM | POA: Insufficient documentation

## 2019-12-05 DIAGNOSIS — D649 Anemia, unspecified: Secondary | ICD-10-CM

## 2019-12-05 DIAGNOSIS — I129 Hypertensive chronic kidney disease with stage 1 through stage 4 chronic kidney disease, or unspecified chronic kidney disease: Secondary | ICD-10-CM | POA: Insufficient documentation

## 2019-12-05 DIAGNOSIS — R1084 Generalized abdominal pain: Secondary | ICD-10-CM | POA: Insufficient documentation

## 2019-12-05 DIAGNOSIS — Z79899 Other long term (current) drug therapy: Secondary | ICD-10-CM | POA: Insufficient documentation

## 2019-12-05 DIAGNOSIS — N183 Chronic kidney disease, stage 3 unspecified: Secondary | ICD-10-CM | POA: Insufficient documentation

## 2019-12-05 LAB — POC OCCULT BLOOD, ED: Fecal Occult Bld: NEGATIVE

## 2019-12-05 MED ORDER — HYDROMORPHONE HCL 1 MG/ML IJ SOLN
1.0000 mg | Freq: Once | INTRAMUSCULAR | Status: AC
  Administered 2019-12-06: 1 mg via INTRAVENOUS
  Filled 2019-12-05: qty 1

## 2019-12-05 MED ORDER — ONDANSETRON HCL 4 MG/2ML IJ SOLN
4.0000 mg | Freq: Once | INTRAMUSCULAR | Status: AC
  Administered 2019-12-06: 4 mg via INTRAVENOUS
  Filled 2019-12-05: qty 2

## 2019-12-05 MED ORDER — SODIUM CHLORIDE 0.9% FLUSH: 3.0000 mL | Freq: Once | INTRAVENOUS | Status: DC

## 2019-12-05 NOTE — ED Triage Notes (Signed)
Patient lives at home with her daughter. She presents with bright red blood per rectum and vomiting for 2 days. She also had a fall 2 days ago and hit her head. She now has a hematoma. Not on blood thinners. The patient was given 263mg of Fentanyl for abdominal pain and Zofran by EMS through PICC in the left thigh. The patient is normally on TPN 20 hours a day, but has been off now for 2 hours. HX [place]   EMS vitals: 132/60 BP 82-70 HR 96% O2 sat on room air

## 2019-12-06 DIAGNOSIS — R1084 Generalized abdominal pain: Secondary | ICD-10-CM | POA: Diagnosis not present

## 2019-12-06 LAB — URINALYSIS, ROUTINE W REFLEX MICROSCOPIC
Bilirubin Urine: NEGATIVE
Glucose, UA: NEGATIVE mg/dL
Hgb urine dipstick: NEGATIVE
Ketones, ur: NEGATIVE mg/dL
Nitrite: NEGATIVE
Protein, ur: NEGATIVE mg/dL
Specific Gravity, Urine: 1.019 (ref 1.005–1.030)
pH: 6 (ref 5.0–8.0)

## 2019-12-06 LAB — CBC
HCT: 25.4 % — ABNORMAL LOW (ref 36.0–46.0)
Hemoglobin: 7.9 g/dL — ABNORMAL LOW (ref 12.0–15.0)
MCH: 29.6 pg (ref 26.0–34.0)
MCHC: 31.1 g/dL (ref 30.0–36.0)
MCV: 95.1 fL (ref 80.0–100.0)
Platelets: 198 10*3/uL (ref 150–400)
RBC: 2.67 MIL/uL — ABNORMAL LOW (ref 3.87–5.11)
RDW: 15.8 % — ABNORMAL HIGH (ref 11.5–15.5)
WBC: 11.6 10*3/uL — ABNORMAL HIGH (ref 4.0–10.5)
nRBC: 0 % (ref 0.0–0.2)

## 2019-12-06 LAB — PREGNANCY, URINE: Preg Test, Ur: NEGATIVE

## 2019-12-06 LAB — COMPREHENSIVE METABOLIC PANEL
ALT: 61 U/L — ABNORMAL HIGH (ref 0–44)
AST: 26 U/L (ref 15–41)
Albumin: 3.3 g/dL — ABNORMAL LOW (ref 3.5–5.0)
Alkaline Phosphatase: 184 U/L — ABNORMAL HIGH (ref 38–126)
Anion gap: 10 (ref 5–15)
BUN: 37 mg/dL — ABNORMAL HIGH (ref 6–20)
CO2: 19 mmol/L — ABNORMAL LOW (ref 22–32)
Calcium: 8.9 mg/dL (ref 8.9–10.3)
Chloride: 109 mmol/L (ref 98–111)
Creatinine, Ser: 1.75 mg/dL — ABNORMAL HIGH (ref 0.44–1.00)
GFR calc Af Amer: 37 mL/min — ABNORMAL LOW (ref >60)
GFR calc non Af Amer: 32 mL/min — ABNORMAL LOW (ref >60)
Glucose, Bld: 89 mg/dL (ref 70–99)
Potassium: 3.4 mmol/L — ABNORMAL LOW (ref 3.5–5.1)
Sodium: 138 mmol/L (ref 135–145)
Total Bilirubin: 1.1 mg/dL (ref 0.3–1.2)
Total Protein: 6.8 g/dL (ref 6.5–8.1)

## 2019-12-06 LAB — TYPE AND SCREEN
ABO/RH(D): O POS
Antibody Screen: NEGATIVE

## 2019-12-06 LAB — ABO/RH: ABO/RH(D): O POS

## 2019-12-06 LAB — LIPASE, BLOOD: Lipase: 19 U/L (ref 11–51)

## 2019-12-06 MED ORDER — HYDROMORPHONE HCL 4 MG PO TABS: 4.0000 mg | ORAL_TABLET | Freq: Four times a day (QID) | ORAL | 0 refills | Status: DC | PRN

## 2019-12-06 MED ORDER — ONDANSETRON HCL 4 MG/2ML IJ SOLN
4.0000 mg | Freq: Once | INTRAMUSCULAR | Status: AC
  Administered 2019-12-06: 01:00:00 4 mg via INTRAVENOUS
  Filled 2019-12-06: qty 2

## 2019-12-06 MED ORDER — HEPARIN SOD (PORK) LOCK FLUSH 100 UNIT/ML IV SOLN
INTRAVENOUS | Status: AC
  Administered 2019-12-06: 03:00:00 500 [IU]
  Filled 2019-12-06: qty 5

## 2019-12-06 MED ORDER — HYDROMORPHONE HCL 1 MG/ML IJ SOLN
1.0000 mg | Freq: Once | INTRAMUSCULAR | Status: AC
  Administered 2019-12-06: 1 mg via INTRAVENOUS
  Filled 2019-12-06: qty 1

## 2019-12-06 NOTE — ED Provider Notes (Signed)
[facility] DEPT Provider Note   CSN: [id number] Arrival date & time: 12/05/19  2210     History Chief Complaint  Patient presents with  . Rectal Bleeding  . Abdominal Pain  . Emesis    Caitlin Peters is a 59 y.o. female.  The history is provided by the patient.  Rectal Bleeding Quality:  Bright red Amount:  Moderate Timing:  Intermittent Chronicity:  New Relieved by:  None tried Worsened by:  Nothing Associated symptoms: abdominal pain and vomiting   Associated symptoms: no fever and no hematemesis   Risk factors: no anticoagulant use   Abdominal Pain Associated symptoms: diarrhea, hematochezia, nausea and vomiting   Associated symptoms: no chest pain, no cough, no dysuria, no fever and no hematemesis   Emesis Associated symptoms: abdominal pain and diarrhea   Associated symptoms: no cough and no fever   Patient with extensive history including Crohn's that required multiple surgeries, patient is on TPN, chronic kidney disease, chronic pain syndrome presents with abdominal cramping and diarrhea.  Patient reports she was in the hospital recently and was discharged earlier this month.  Over the past 2 to 3 days she has had increasing cramping, loose stool and red blood in her stool.  No vomiting blood.     Past Medical History:  Diagnosis Date  . Anasarca 10/2019  . AVN (avascular necrosis of bone) ([place])   . Cataract   . Chronic pain syndrome   . CKD (chronic kidney disease), stage III   . Crohn disease ([place])   . Crohn disease ([place])   . Depression   . Diverticulosis   . GERD (gastroesophageal reflux disease)   . HTN (hypertension)   . IDA (iron deficiency anemia)   . Malnutrition ([place])   . Mass in chest   . Osteoporosis   . Pancreatitis   . Short gut syndrome   . Vitamin B12 deficiency     Patient Active Problem List   Diagnosis Date Noted  . Cholangitis   . Generalized abdominal pain   . Intractable abdominal pain  11/25/2019  . Anasarca 10/28/2019  . Acute kidney injury superimposed on chronic kidney disease ([place]) 10/28/2019  . Falls 10/28/2019  . Crohn disease ([place])   . Malnutrition ([place])   . Short gut syndrome   . HTN (hypertension)   . Vitamin B12 deficiency   . Osteoporosis   . Depression   . GERD (gastroesophageal reflux disease)   . Chronic pain syndrome   . Hypokalemia due to excessive gastrointestinal loss of potassium 10/13/2019  . Diarrhea   . Fever   . Hypokalemia 10/12/2019  . Chronic diarrhea 10/12/2019  . Dysuria 10/12/2019  . Bilateral lower extremity edema 10/12/2019  . Crohn's disease ([place]) 10/12/2019  . Anemia 10/12/2019  . Hypertension 10/12/2019  . AKI (acute kidney injury) ([place]) 10/12/2019    Past Surgical History:  Procedure Laterality Date  . ABDOMINAL HYSTERECTOMY    . ABDOMINAL SURGERY     colon resection with abdominal stoma, repair of intestinal leak, reversal of abdominal stoma  . BILIARY DILATION  11/26/2019   Procedure: BILIARY DILATION;  Surgeon: Jackquline Denmark, MD;  Location: [facility];  Service: Endoscopy;;  . CHEST WALL RESECTION     right thoracotomy,resection of chest mass with anterior rib and reconstruction using prosthetic mesh and video arthroscopy  . CHOLECYSTECTOMY    . COLONOSCOPY  2019  . ERCP N/A 11/26/2019   Procedure: ENDOSCOPIC RETROGRADE CHOLANGIOPANCREATOGRAPHY (ERCP);  Surgeon: Lyndel Safe,  Peyton Bottoms, MD;  Location: [facility];  Service: Endoscopy;  Laterality: N/A;  . IR US GUIDE VASC ACCESS LEFT     x 2 06/17/19 and 09/14/2019  . KNEE SURGERY     right knee   . REMOVAL OF STONES  11/26/2019   Procedure: REMOVAL OF STONES;  Surgeon: Jackquline Denmark, MD;  Location: [facility];  Service: Endoscopy;;  . SMALL INTESTINE SURGERY     x3  . SPHINCTEROTOMY  11/26/2019   Procedure: SPHINCTEROTOMY;  Surgeon: Jackquline Denmark, MD;  Location: [facility];  Service: Endoscopy;;  . UPPER GASTROINTESTINAL ENDOSCOPY       OB History   No  obstetric history on file.     Family History  Problem Relation Age of Onset  . Breast cancer Sister   . Multiple sclerosis Sister   . Diabetes Sister   . Lupus Sister   . Colon cancer Other   . Crohn's disease Other   . Seizures Mother   . Glaucoma Mother   . CAD Father   . Heart disease Father   . Hypertension Father     Social History   Tobacco Use  . Smoking status: Former Research scientist (life sciences)  . Smokeless tobacco: Never Used  Substance Use Topics  . Alcohol use: Not Currently  . Drug use: Never    Home Medications Prior to Admission medications   Medication Sig Start Date End Date Taking? Authorizing Provider  acetaminophen (TYLENOL) 325 MG tablet Take 650 mg by mouth every 6 (six) hours as needed for mild pain.     [provider]  amLODipine (NORVASC) 5 MG tablet Take 1 tablet (5 mg total) by mouth daily. Patient taking differently: Take 10 mg by mouth daily.  10/16/19   [name], [name], MD  budesonide (ENTOCORT EC) 3 MG 24 hr capsule Take 3 capsules (9 mg total) by mouth daily. 11/01/19   Dessa Phi, DO  buPROPion (WELLBUTRIN SR) 100 MG 12 hr tablet Take 200 mg by mouth daily.     [provider]  calcium carbonate (OS-CAL) 1250 (500 Ca) MG chewable tablet Chew 1 tablet by mouth 2 (two) times daily.    [provider]  cholecalciferol (VITAMIN D3) 25 MCG (1000 UT) tablet Take 2,000 Units by mouth daily.    [provider]  cycloSPORINE (RESTASIS) 0.05 % ophthalmic emulsion Place 1 drop into both eyes 2 (two) times daily.    [provider]  dexlansoprazole (DEXILANT) 60 MG capsule Take 1 capsule (60 mg total) by mouth daily. 10/16/19   [name], [name], MD  Dextran 70-Hypromellose 0.1-0.3 % SOLN Place 1 drop into both eyes 4 (four) times daily.    [provider]  dicyclomine (BENTYL) 20 MG tablet Take 20 mg by mouth 3 (three) times daily. 10/26/19   [provider]  diphenoxylate-atropine  (LOMOTIL) 2.5-0.025 MG tablet Take 1 tablet by mouth 4 (four) times daily as needed for diarrhea or loose stools. 10/31/19   Dessa Phi, DO  DULoxetine (CYMBALTA) 30 MG capsule Take 3 capsules (90 mg total) by mouth daily. 11/01/19   Dessa Phi, DO  estradiol (ESTRACE) 2 MG tablet Take 2 mg by mouth daily.    [provider]  HYDROmorphone (DILAUDID) 4 MG tablet Take 1 tablet (4 mg total) by mouth every 6 (six) hours as needed for moderate pain or severe pain. 11/30/19   Shelly Coss, MD  levofloxacin (LEVAQUIN) 750 MG tablet Take 1 tablet (750 mg total) by mouth  every other day for 5 doses. 11/30/19 12/09/19  Shelly Coss, MD  lipase/protease/amylase (CREON) 36000 UNITS CPEP capsule Take 1 capsule (36,000 Units total) by mouth 3 (three) times daily before meals. Patient taking differently: Take 108,000 Units by mouth 3 (three) times daily before meals.  10/31/19   Dessa Phi, DO  loperamide (IMODIUM) 2 MG capsule Take 1 capsule (2 mg total) by mouth as needed for diarrhea or loose stools. 10/16/19   [name], [name], MD  methadone (DOLOPHINE) 10 MG tablet Take 2 tablets (20 mg total) by mouth every 12 (twelve) hours. 11/30/19   Shelly Coss, MD  metoprolol tartrate (LOPRESSOR) 100 MG tablet Take 100 mg by mouth 2 (two) times daily.    [provider]  Multiple Vitamins-Minerals (MULTIVITAMIN ADULT PO) Take 1 tablet by mouth daily.    [provider]  potassium chloride SA (KLOR-CON) 20 MEQ tablet Take 2 tablets (40 mEq total) by mouth daily. 10/16/19   [name], [name], MD  Probiotic Product (PROBIOTIC-10 PO) Take 1 capsule by mouth daily.    [provider]  promethazine (PHENERGAN) 25 MG tablet Take 1 tablet (25 mg total) by mouth every 6 (six) hours as needed for nausea or vomiting. 11/19/19   [name], [name], MD  sucralfate (CARAFATE) 1 g tablet Take 1 tablet (1 g total) by mouth 4 (four) times daily -  before meals and  at bedtime. 10/31/19   Dessa Phi, DO  traZODone (DESYREL) 100 MG tablet Take 2 tablets (200 mg total) by mouth at bedtime as needed for sleep. 10/31/19   Dessa Phi, DO  vitamin B-12 (CYANOCOBALAMIN) 100 MCG tablet Take 100 mcg by mouth daily.    [provider]    Allergies    Cefepime, Gabapentin, Hyoscyamine, Lyrica [pregabalin], Meperidine, Topamax [topiramate], Fentanyl, and Morphine and related  Review of Systems   Review of Systems  Constitutional: Negative for fever.  Respiratory: Negative for cough.   Cardiovascular: Negative for chest pain.  Gastrointestinal: Positive for abdominal pain, blood in stool, diarrhea, hematochezia, nausea and vomiting. Negative for hematemesis.  Genitourinary: Negative for dysuria.  All other systems reviewed and are negative.   Physical Exam Updated Vital Signs BP (!) 144/66 (BP Location: Right Arm)   Pulse 76   Temp 98.8 F (37.1 C) (Oral)   Resp 16   Ht 1.727 m (5' 8" )   Wt 56.7 kg   SpO2 100%   BMI 19.01 kg/m   Physical Exam CONSTITUTIONAL: Chronically ill-appearing, no acute distress HEAD: Normocephalic/atraumatic EYES: EOMI/PERRL ENMT: Mucous membranes moist NECK: supple no meningeal signs SPINE/BACK:entire spine nontender CV: S1/S2 noted, no murmurs/rubs/gallops noted LUNGS: Lungs are clear to auscultation bilaterally, no apparent distress ABDOMEN: soft, well-healed midline scar noted, diffuse mild tenderness, decreased bowel sounds no GU:no cva tenderness Rectal-no blood or melena, no masses, appropriate sphincter tone, female chaperone present Hemoccult negative NEURO: Pt is awake/alert/appropriate, moves all extremitiesx4.  No facial droop.   EXTREMITIES: pulses normal/equal, full ROM, TPN line noted to left femoral region SKIN: warm, color normal PSYCH: no abnormalities of mood noted, alert and oriented to situation  ED Results / Procedures / Treatments   Labs (all labs ordered are listed, but only  abnormal results are displayed) Labs Reviewed  COMPREHENSIVE METABOLIC PANEL - Abnormal; Notable for the following components:      Result Value   Potassium 3.4 (*)    CO2 19 (*)    BUN 37 (*)    Creatinine,  Ser 1.75 (*)    Albumin 3.3 (*)    ALT 61 (*)    Alkaline Phosphatase 184 (*)    GFR calc non Af Amer 32 (*)    GFR calc Af Amer 37 (*)    All other components within normal limits  CBC - Abnormal; Notable for the following components:   WBC 11.6 (*)    RBC 2.67 (*)    Hemoglobin 7.9 (*)    HCT 25.4 (*)    RDW 15.8 (*)    All other components within normal limits  URINALYSIS, ROUTINE W REFLEX MICROSCOPIC - Abnormal; Notable for the following components:   APPearance HAZY (*)    Leukocytes,Ua TRACE (*)    Bacteria, UA RARE (*)    All other components within normal limits  URINE CULTURE  LIPASE, BLOOD  PREGNANCY, URINE  POC OCCULT BLOOD, ED  I-STAT BETA HCG BLOOD, ED ([facility], [facility], [facility] ONLY)  TYPE AND SCREEN    EKG None  Radiology No results found.  Procedures Procedures  Medications Ordered in ED Medications  sodium chloride flush (NS) 0.9 % injection 3 mL (3 mLs Intravenous Not Given 12/06/19 0047)  HYDROmorphone (DILAUDID) injection 1 mg (1 mg Intravenous Given 12/06/19 0018)  ondansetron (ZOFRAN) injection 4 mg (4 mg Intravenous Given 12/06/19 0018)  HYDROmorphone (DILAUDID) injection 1 mg (1 mg Intravenous Given 12/06/19 0128)  ondansetron (ZOFRAN) injection 4 mg (4 mg Intravenous Given 12/06/19 0128)    ED Course  I have reviewed the triage vital signs and the nursing notes.  Pertinent labs results that were available during my care of the patient were reviewed by me and considered in my medical decision making (see chart for details).    MDM Rules/Calculators/A&P                      12:19 AM Patient with recent hospital stay discharged on January 4 Patient was found to have ascending cholangitis, required urgent ERCP with sphincterotomy She also had  proctocolitis at that time Today's issue includes increasing abdominal cramping as well as blood in her stool.  At this time patient appears appropriate, and stable.  Labs are pending. She did mention to nurse about a recent fall, the patient did not mention this to me 2:16 AM Patient did report recent fall.  She typically falls asleep on the toilet and will fall.  This occurred over 24 hours ago, no vomiting, feels improved.  Defer imaging, no other acute traumatic injury reported  Patient feels improved.  Labs near baseline, patient has a history of chronic anemia with minimal change She is in no acute distress.  She is not vomiting. She appears appropriate for discharge home.  Patient is in the process of setting up with pain management has not had ongoing pain management this time.  Per narcotic database she did just recently run out of Dilaudid.  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  I am concerned that she could undergo opiate withdrawal.  I did prescribe her a short course of Dilaudid tablets, and she will follow-up as an outpatient. No signs of acute abdominal emergency at this time Final Clinical Impression(s) / ED Diagnoses Final diagnoses:  Generalized abdominal pain  Diarrhea, unspecified type  Chronic anemia    Rx / DC Orders ED Discharge Orders         Ordered    HYDROmorphone (DILAUDID) 4 MG tablet  Every 6 hours PRN  12/06/19 0215           Ripley Fraise, MD 12/06/19 [phone]

## 2019-12-06 NOTE — ED Notes (Signed)
Pt states that she is unable to stand for orthostatic vital signs at this time.

## 2019-12-06 NOTE — ED Notes (Signed)
Pt is calling her ride home.

## 2019-12-07 ENCOUNTER — Emergency Department (HOSPITAL_COMMUNITY)
Admission: EM | Admit: 2019-12-07 | Discharge: 2019-12-08 | Disposition: A | Discharge status: Home / Self Care | Payer: Medicare Other | Attending: Emergency Medicine | Admitting: Emergency Medicine

## 2019-12-07 ENCOUNTER — Telehealth: Payer: Self-pay | Admitting: Gastroenterology

## 2019-12-07 ENCOUNTER — Emergency Department (HOSPITAL_COMMUNITY): Payer: Medicare Other

## 2019-12-07 ENCOUNTER — Encounter (HOSPITAL_COMMUNITY): Payer: Self-pay

## 2019-12-07 ENCOUNTER — Other Ambulatory Visit: Payer: Self-pay

## 2019-12-07 DIAGNOSIS — Z79899 Other long term (current) drug therapy: Secondary | ICD-10-CM | POA: Diagnosis not present

## 2019-12-07 DIAGNOSIS — K529 Noninfective gastroenteritis and colitis, unspecified: Secondary | ICD-10-CM | POA: Diagnosis not present

## 2019-12-07 DIAGNOSIS — R109 Unspecified abdominal pain: Secondary | ICD-10-CM | POA: Diagnosis present

## 2019-12-07 DIAGNOSIS — Z87891 Personal history of nicotine dependence: Secondary | ICD-10-CM | POA: Diagnosis not present

## 2019-12-07 DIAGNOSIS — D631 Anemia in chronic kidney disease: Secondary | ICD-10-CM | POA: Diagnosis not present

## 2019-12-07 DIAGNOSIS — I129 Hypertensive chronic kidney disease with stage 1 through stage 4 chronic kidney disease, or unspecified chronic kidney disease: Secondary | ICD-10-CM | POA: Insufficient documentation

## 2019-12-07 DIAGNOSIS — E876 Hypokalemia: Secondary | ICD-10-CM | POA: Diagnosis not present

## 2019-12-07 DIAGNOSIS — N183 Chronic kidney disease, stage 3 unspecified: Secondary | ICD-10-CM | POA: Diagnosis not present

## 2019-12-07 DIAGNOSIS — D649 Anemia, unspecified: Secondary | ICD-10-CM

## 2019-12-07 LAB — URINE CULTURE

## 2019-12-07 MED ORDER — SODIUM CHLORIDE 0.9% FLUSH
3.0000 mL | Freq: Once | INTRAVENOUS | Status: AC
  Administered 2019-12-08: 3 mL via INTRAVENOUS

## 2019-12-07 MED ORDER — SODIUM CHLORIDE 0.9 % IV BOLUS
1000.0000 mL | Freq: Once | INTRAVENOUS | Status: AC
  Administered 2019-12-07: 1000 mL via INTRAVENOUS

## 2019-12-07 MED ORDER — HYDROMORPHONE HCL 1 MG/ML IJ SOLN
0.5000 mg | Freq: Once | INTRAMUSCULAR | Status: AC
  Administered 2019-12-07: 0.5 mg via INTRAVENOUS
  Filled 2019-12-07: qty 1

## 2019-12-07 NOTE — ED Notes (Signed)
Pt has a line and will get blood from it.

## 2019-12-07 NOTE — ED Provider Notes (Signed)
[facility] DEPT Provider Note   CSN: [id number] Arrival date & time: 12/07/19  1241     History Chief Complaint  Patient presents with   Abdominal Pain   Nausea    Caitlin Peters is a 59 y.o. female history AVM, chronic pain, CKD, Crohn's, diverticulosis, GERD, hypertension, iron deficiency anemia, short gut syndrome, on TPN.  Patient reports history of chronic abdominal pain but increasing in intensity over the last 3 days.  She describes a burning mid abdominal pain constant worsened with eating movement palpation no alleviating factors, no radiation, moderate-severe in intensity.  Reports history of chronic diarrhea due to Crohn's without change, no blood.  Denies fever/chills, headache, chest pain/shortness of breath, cough, vomiting, normal/tingling, weakness, fall/injury or additional concerns.  Seen in ED 12/05/2019, reported rectal bleeding at that time with abdominal pain nausea and vomiting.  Hemoccult was negative.  Patient states that since discharge rectal bleeding resolved.  Blood work was baseline, there was concern for opiate withdrawal medications.  Short course of Dilaudid tablets.  Of note was admitted on 11/25/2019 for a sending cholangitis with Enterobacter bacteremia, underwent ERCP and sphincterotomy with improvement. HPI     Past Medical History:  Diagnosis Date   Anasarca 10/2019   AVN (avascular necrosis of bone) ([name])    Cataract    Chronic pain syndrome    CKD (chronic kidney disease), stage III    Crohn disease ([name])    Crohn disease ([name])    Depression    Diverticulosis    GERD (gastroesophageal reflux disease)    HTN (hypertension)    IDA (iron deficiency anemia)    Malnutrition ([name])    Mass in chest    Osteoporosis    Pancreatitis    Short gut syndrome    Vitamin B12 deficiency     Patient Active Problem List   Diagnosis Date Noted   Cholangitis    Generalized abdominal pain     Intractable abdominal pain 11/25/2019   Anasarca 10/28/2019   Acute kidney injury superimposed on chronic kidney disease ([place]) 10/28/2019   Falls 10/28/2019   Crohn disease ([place])    Malnutrition ([place])    Short gut syndrome    HTN (hypertension)    Vitamin B12 deficiency    Osteoporosis    Depression    GERD (gastroesophageal reflux disease)    Chronic pain syndrome    Hypokalemia due to excessive gastrointestinal loss of potassium 10/13/2019   Diarrhea    Fever    Hypokalemia 10/12/2019   Chronic diarrhea 10/12/2019   Dysuria 10/12/2019   Bilateral lower extremity edema 10/12/2019   Crohn's disease ([place]) 10/12/2019   Anemia 10/12/2019   Hypertension 10/12/2019   AKI (acute kidney injury) ([place]) 10/12/2019    Past Surgical History:  Procedure Laterality Date   ABDOMINAL HYSTERECTOMY     ABDOMINAL SURGERY     colon resection with abdominal stoma, repair of intestinal leak, reversal of abdominal stoma   BILIARY DILATION  11/26/2019   Procedure: BILIARY DILATION;  Surgeon: Jackquline Denmark, MD;  Location: [facility];  Service: Endoscopy;;   CHEST WALL RESECTION     right thoracotomy,resection of chest mass with anterior rib and reconstruction using prosthetic mesh and video arthroscopy   CHOLECYSTECTOMY     COLONOSCOPY  2019   ERCP N/A 11/26/2019   Procedure: ENDOSCOPIC RETROGRADE CHOLANGIOPANCREATOGRAPHY (ERCP);  Surgeon: Jackquline Denmark, MD;  Location: [facility];  Service: Endoscopy;  Laterality: N/A;   IR US GUIDE VASC  ACCESS LEFT     x 2 06/17/19 and 09/14/2019   KNEE SURGERY     right knee    REMOVAL OF STONES  11/26/2019   Procedure: REMOVAL OF STONES;  Surgeon: Jackquline Denmark, MD;  Location: [facility];  Service: Endoscopy;;   SMALL INTESTINE SURGERY     x3   SPHINCTEROTOMY  11/26/2019   Procedure: SPHINCTEROTOMY;  Surgeon: Jackquline Denmark, MD;  Location: [facility];  Service: Endoscopy;;   UPPER GASTROINTESTINAL ENDOSCOPY        OB History   No obstetric history on file.     Family History  Problem Relation Age of Onset   Breast cancer Sister    Multiple sclerosis Sister    Diabetes Sister    Lupus Sister    Colon cancer Other    Crohn's disease Other    Seizures Mother    Glaucoma Mother    CAD Father    Heart disease Father    Hypertension Father     Social History   Tobacco Use   Smoking status: Former Smoker   Smokeless tobacco: Never Used  Substance Use Topics   Alcohol use: Not Currently   Drug use: Never    Home Medications Prior to Admission medications   Medication Sig Start Date End Date Taking? Authorizing Provider  acetaminophen (TYLENOL) 325 MG tablet Take 650 mg by mouth every 6 (six) hours as needed for mild pain.    Yes [provider]  amLODipine (NORVASC) 5 MG tablet Take 1 tablet (5 mg total) by mouth daily. Patient taking differently: Take 10 mg by mouth daily.  10/16/19  Yes [name], [name], MD  budesonide (ENTOCORT EC) 3 MG 24 hr capsule Take 3 capsules (9 mg total) by mouth daily. 11/01/19  Yes Dessa Phi, DO  buPROPion  [facility] SR) 100 MG 12 hr tablet Take 200 mg by mouth daily.    Yes [provider]  calcium carbonate (OS-CAL) 1250 (500 Ca) MG chewable tablet Chew 1 tablet by mouth 2 (two) times daily.   Yes [provider]  cholecalciferol (VITAMIN D3) 25 MCG (1000 UT) tablet Take 2,000 Units by mouth daily.   Yes [provider]  cycloSPORINE (RESTASIS) 0.05 % ophthalmic emulsion Place 1 drop into both eyes 2 (two) times daily.   Yes [provider]  dexlansoprazole (DEXILANT) 60 MG capsule Take 1 capsule (60 mg total) by mouth daily. 10/16/19  Yes [name], [name], MD  Dextran 70-Hypromellose 0.1-0.3 % SOLN Place 1 drop into both eyes 4 (four) times daily.   Yes [provider]  dicyclomine (BENTYL) 20 MG tablet Take 20 mg by mouth 3 (three) times daily. 10/26/19  Yes  [provider]  diphenoxylate-atropine (LOMOTIL) 2.5-0.025 MG tablet Take 1 tablet by mouth 4 (four) times daily as needed for diarrhea or loose stools. 10/31/19  Yes Dessa Phi, DO  DULoxetine (CYMBALTA) 30 MG capsule Take 3 capsules (90 mg total) by mouth daily. 11/01/19  Yes Dessa Phi, DO  estradiol (ESTRACE) 2 MG tablet Take 2 mg by mouth daily.   Yes [provider]  HYDROmorphone (DILAUDID) 4 MG tablet Take 1 tablet (4 mg total) by mouth every 6 (six) hours as needed for severe pain. 12/06/19  Yes Ripley Fraise, MD  levofloxacin (LEVAQUIN) 750 MG tablet Take 1 tablet (750 mg total) by mouth every other day for 5 doses. 11/30/19 12/09/19 Yes Shelly Coss, MD  lipase/protease/amylase (CREON) 36000 UNITS CPEP capsule Take 1  capsule (36,000 Units total) by mouth 3 (three) times daily before meals. Patient taking differently: Take 108,000 Units by mouth 3 (three) times daily before meals.  10/31/19  Yes Dessa Phi, DO  loperamide (IMODIUM) 2 MG capsule Take 1 capsule (2 mg total) by mouth as needed for diarrhea or loose stools. 10/16/19  Yes [name], [name], MD  methadone (DOLOPHINE) 10 MG tablet Take 2 tablets (20 mg total) by mouth every 12 (twelve) hours. 11/30/19  Yes Shelly Coss, MD  metoprolol tartrate (LOPRESSOR) 100 MG tablet Take 100 mg by mouth 2 (two) times daily.   Yes [provider]  Multiple Vitamins-Minerals (MULTIVITAMIN ADULT PO) Take 1 tablet by mouth daily.   Yes [provider]  potassium chloride SA (KLOR-CON) 20 MEQ tablet Take 2 tablets (40 mEq total) by mouth daily. 10/16/19  Yes [name], [name], MD  Probiotic Product (PROBIOTIC-10 PO) Take 1 capsule by mouth daily.   Yes [provider]  promethazine (PHENERGAN) 25 MG tablet Take 1 tablet (25 mg total) by mouth every 6 (six) hours as needed for nausea or vomiting. 11/19/19  Yes [name], [name], MD  sucralfate (CARAFATE) 1 g tablet  Take 1 tablet (1 g total) by mouth 4 (four) times daily -  before meals and at bedtime. 10/31/19  Yes Dessa Phi, DO  traZODone (DESYREL) 100 MG tablet Take 2 tablets (200 mg total) by mouth at bedtime as needed for sleep. 10/31/19  Yes Dessa Phi, DO  vitamin B-12 (CYANOCOBALAMIN) 100 MCG tablet Take 100 mcg by mouth daily.   Yes [provider]  ciprofloxacin (CIPRO) 500 MG tablet Take 1 tablet (500 mg total) by mouth every 12 (twelve) hours for 5 days. 12/08/19 12/13/19  Nuala Alpha A, PA-C  metroNIDAZOLE (FLAGYL) 500 MG tablet Take 1 tablet (500 mg total) by mouth 3 (three) times daily for 5 days. 12/08/19 12/13/19  Nuala Alpha A, PA-C  potassium chloride (KLOR-CON) 10 MEQ tablet Take 2 tablets (20 mEq total) by mouth daily for 2 days. 12/08/19 12/10/19  Nuala Alpha A, PA-C    Allergies    Cefepime, Gabapentin, Hyoscyamine, Lyrica [pregabalin], Meperidine, Topamax [topiramate], Fentanyl, and Morphine and related  Review of Systems   Review of Systems Ten systems are reviewed and are negative for acute change except as noted in the HPI  Physical Exam Updated Vital Signs BP (!) 151/64    Pulse 64    Temp 99 F (37.2 C) (Oral)    Resp 13    Ht 5' 8"  (1.727 m)    Wt 59 kg    SpO2 99%    BMI 19.77 kg/m   Physical Exam Constitutional:      General: She is not in acute distress.    Appearance: Normal appearance. She is well-developed. She is not ill-appearing or diaphoretic.  HENT:     Head: Normocephalic and atraumatic.     Right Ear: External ear normal.     Left Ear: External ear normal.     Nose: Nose normal.  Eyes:     General: Vision grossly intact. Gaze aligned appropriately.     Pupils: Pupils are equal, round, and reactive to light.  Neck:     Trachea: Trachea and phonation normal. No tracheal deviation.  Cardiovascular:     Rate and Rhythm: Normal rate and regular rhythm.  Pulmonary:     Effort: Pulmonary effort is normal. No respiratory distress.    Abdominal:  General: There is no distension.     Palpations: Abdomen is soft.     Tenderness: There is generalized abdominal tenderness and tenderness in the periumbilical area. There is guarding. There is no rebound. Negative signs include Murphy's sign and McBurney's sign.  Genitourinary:    Comments: Deferred by patient Musculoskeletal:        General: Normal range of motion.     Cervical back: Normal range of motion.  Skin:    General: Skin is warm and dry.  Neurological:     Mental Status: She is alert.     GCS: GCS eye subscore is 4. GCS verbal subscore is 5. GCS motor subscore is 6.     Comments: Speech is clear and goal oriented, follows commands Major Cranial nerves without deficit, no facial droop Moves extremities without ataxia, coordination intact  Psychiatric:        Behavior: Behavior normal.     ED Results / Procedures / Treatments   Labs (all labs ordered are listed, but only abnormal results are displayed) Labs Reviewed  COMPREHENSIVE METABOLIC PANEL - Abnormal; Notable for the following components:      Result Value   Potassium 3.1 (*)    BUN 31 (*)    Creatinine, Ser 1.20 (*)    Albumin 3.2 (*)    Alkaline Phosphatase 150 (*)    GFR calc non Af Amer 50 (*)    GFR calc Af Amer 58 (*)    All other components within normal limits  URINALYSIS, ROUTINE W REFLEX MICROSCOPIC - Abnormal; Notable for the following components:   APPearance HAZY (*)    Protein, ur 30 (*)    Leukocytes,Ua TRACE (*)    Bacteria, UA RARE (*)    All other components within normal limits  CBC WITH DIFFERENTIAL/PLATELET - Abnormal; Notable for the following components:   RBC 2.58 (*)    Hemoglobin 7.6 (*)    HCT 24.0 (*)    RDW 15.8 (*)    All other components within normal limits  GI PATHOGEN PANEL BY PCR, STOOL  C DIFFICILE QUICK SCREEN W PCR REFLEX  LIPASE, BLOOD  I-STAT BETA HCG BLOOD, ED ([facility], [facility], [facility] ONLY)    EKG None  Radiology CT ABDOMEN PELVIS W  CONTRAST  Result Date: 12/08/2019 CLINICAL DATA:  Abdominal pain EXAM: CT ABDOMEN AND PELVIS WITH CONTRAST TECHNIQUE: Multidetector CT imaging of the abdomen and pelvis was performed using the standard protocol following bolus administration of intravenous contrast. CONTRAST:  65m OMNIPAQUE IOHEXOL 300 MG/ML  SOLN COMPARISON:  November 25, 2019 FINDINGS: Lower chest: The visualized heart size within normal limits. No pericardial fluid/thickening. No hiatal hernia. The visualized portions of the lungs are clear. Hepatobiliary: The liver is normal in density without focal abnormality.The main portal vein is patent. The patient is status post cholecystectomy. Pancreas: Unremarkable. No pancreatic ductal dilatation or surrounding inflammatory changes. Spleen: Normal in size without focal abnormality. Adrenals/Urinary Tract: Both adrenal glands appear normal. The kidneys and collecting system appear normal without evidence of urinary tract calculus or hydronephrosis. Bladder is unremarkable. Stomach/Bowel: The stomach in grossly unremarkable. The patient has had a prior partial colectomy with ileocolonic anastomosis in the transverse colon. There is mildly dilated fluid and air-filled descending colon and sigmoid colon to the level of the sigmoid rectal junction where there appears to be question of diffuse wall thickening and a focal area of narrowing this is best seen on series 2, image 70. There is mild surrounding mesenteric  edema seen within this region. This was seen on prior exam. Vascular/Lymphatic: There are no enlarged mesenteric, retroperitoneal, or pelvic lymph nodes. Again noted is a left femoral venous catheter with the tip terminating in the IVC. Scattered aortic atherosclerosis. Reproductive: The prostate is unremarkable. Other: There is diffuse mild anasarca. Musculoskeletal: No acute or significant osseous findings. IMPRESSION: Mildly dilated air and fluid-filled descending colon and sigmoid colon  with a focal area wall thickening/narrowing at the sigmoid rectal junction. This could be due to focal colitis, infectious or inflammatory. Aortic Atherosclerosis (ICD10-I70.0). Electronically Signed   By: Prudencio Pair M.D.   On: 12/08/2019 02:44   DG Hip Unilat W or Wo Pelvis 1 View Right  Result Date: 12/07/2019 CLINICAL DATA:  PICC line placement EXAM: DG HIP (WITH OR WITHOUT PELVIS) 1V RIGHT COMPARISON:  None. FINDINGS: Left groin PICC line is in place. The tip is in the lower abdomen, likely near the confluence of the iliac veins. IMPRESSION: Left groin PICC tip in the lower abdomen, likely at the confluence of the iliac veins. Electronically Signed   By: Rolm Baptise M.D.   On: 12/07/2019 23:14    Procedures Procedures (including critical care time)  Medications Ordered in ED Medications  sodium chloride (PF) 0.9 % injection (has no administration in time range)  metroNIDAZOLE (FLAGYL) tablet 500 mg (has no administration in time range)  ciprofloxacin (CIPRO) tablet 500 mg (has no administration in time range)  sodium chloride flush (NS) 0.9 % injection 3 mL (3 mLs Intravenous Given 12/08/19 0036)  sodium chloride 0.9 % bolus 1,000 mL (0 mLs Intravenous Stopped 12/08/19 0140)  HYDROmorphone (DILAUDID) injection 0.5 mg (0.5 mg Intravenous Given 12/07/19 2358)  ondansetron (ZOFRAN) injection 2 mg (2 mg Intravenous Given 12/08/19 0031)  HYDROmorphone (DILAUDID) injection 1 mg (1 mg Intravenous Given 12/08/19 0035)  iohexol (OMNIPAQUE) 300 MG/ML solution 100 mL (80 mLs Intravenous Contrast Given 12/08/19 0220)  ondansetron (ZOFRAN) injection 2 mg (2 mg Intravenous Given 12/08/19 0314)  HYDROmorphone (DILAUDID) injection 0.5 mg (0.5 mg Intravenous Given 12/08/19 0325)  potassium chloride SA (KLOR-CON) CR tablet 40 mEq (40 mEq Oral Given 12/08/19 0324)    ED Course  I have reviewed the triage vital signs and the nursing notes.  Pertinent labs & imaging results that were available during my care  of the patient were reviewed by me and considered in my medical decision making (see chart for details).  Clinical Course as of Dec 08 427  Tue Dec 08, 2019  0349 Stool panel; cipro flagyl and outpatient follow-up   [BM]    Clinical Course User Index [BM] Deliah Boston, PA-C   MDM Rules/Calculators/A&P                     CBC with hemoglobin 7.6, similar to prior, patient denies symptoms of anemia, also denies melena or blood in the stool CMP with BUN 31, creatinine 1.2 improved from prior Lipase within normal limits Urinalysis with leukocytes, WBCs, protein, bacteria, squamous cells, appears contaminated, likely dehydrated patient without urinary symptoms doubt UTI Beta-hCG negative DG Hip for nursing PICC access:  IMPRESSION:  Left groin PICC tip in the lower abdomen, likely at the confluence  of the iliac veins.    CT abdomen pelvis:  IMPRESSION:  Mildly dilated air and fluid-filled descending colon and sigmoid  colon with a focal area wall thickening/narrowing at the sigmoid  rectal junction. This could be due to focal colitis, infectious or  inflammatory.  Aortic Atherosclerosis (ICD10-I70.0).  - Patient reassessed resting comfortably no acute distress reports return of pain and nausea, will give additional pain medication and nausea medication.  3:49 AM: Discussed case with Dr. Bryan Lemma, advises stool panel and Cipro 500 mg twice daily, Flagyl 500 mg 3 times daily and outpatient follow-up, he is sending a note to Dr. Lyndel Safe. - Patient reassessment, ambulating around the room dressing herself, reports she is feeling better and is requesting to leave.  She states understanding of care plan and results as above she has no further questions or concerns.  She is calling her ride to pick her up to take her home.  Patient advised to follow incidental findings and to follow-up with PCP and GI regarding them.  At this time there does not appear to be any evidence of an  acute emergency medical condition and the patient appears stable for discharge with appropriate outpatient follow up. Diagnosis was discussed with patient who verbalizes understanding of care plan and is agreeable to discharge. I have discussed return precautions with patient who verbalizes understanding of return precautions. Patient encouraged to follow-up with their PCP and GI. All questions answered.  Patient's case discussed with Dr. Betsey Holiday who agrees with plan to discharge with follow-up.   Note: Portions of this report may have been transcribed using voice recognition software. Every effort was made to ensure accuracy; however, inadvertent computerized transcription errors may still be present. Final Clinical Impression(s) / ED Diagnoses Final diagnoses:  Colitis  Hypokalemia  Anemia, unspecified type    Rx / DC Orders ED Discharge Orders         Ordered    ciprofloxacin (CIPRO) 500 MG tablet  Every 12 hours     12/08/19 0419    metroNIDAZOLE (FLAGYL) 500 MG tablet  3 times daily     12/08/19 0419    potassium chloride (KLOR-CON) 10 MEQ tablet  Daily     12/08/19 0419           Deliah Boston, PA-C 12/08/19 0457    Orpah Greek, MD 12/08/19 [phone]

## 2019-12-07 NOTE — ED Triage Notes (Signed)
Patient c/o mid abdominal pain that radiates into the back and nausea x 3 days. Patient reports a recent hospitalization. Patient has a history of Crohn's

## 2019-12-07 NOTE — ED Notes (Signed)
Caitlin Peters, [place] verified placement of PICC line and okayed use of PICC for labs and CT.

## 2019-12-07 NOTE — Telephone Encounter (Signed)
The patient reports abd pain, resembling the pain that caused her to go to the ED previously. The patient has been scheduled with JL on 1/12 at 1:30 pm. She was unsure if she could be seen at this time and stated she would call back to confirm the appt.

## 2019-12-07 NOTE — ED Notes (Signed)
Patient states she has a PICC line and wants her blood from the PICC line.

## 2019-12-08 ENCOUNTER — Ambulatory Visit: Payer: Medicare Other | Admitting: Physician Assistant

## 2019-12-08 ENCOUNTER — Emergency Department (HOSPITAL_COMMUNITY): Payer: Medicare Other

## 2019-12-08 ENCOUNTER — Encounter (HOSPITAL_COMMUNITY): Payer: Self-pay

## 2019-12-08 DIAGNOSIS — K529 Noninfective gastroenteritis and colitis, unspecified: Secondary | ICD-10-CM | POA: Diagnosis not present

## 2019-12-08 LAB — URINALYSIS, ROUTINE W REFLEX MICROSCOPIC
Bilirubin Urine: NEGATIVE
Glucose, UA: NEGATIVE mg/dL
Hgb urine dipstick: NEGATIVE
Ketones, ur: NEGATIVE mg/dL
Nitrite: NEGATIVE
Protein, ur: 30 mg/dL — AB
Specific Gravity, Urine: 1.019 (ref 1.005–1.030)
pH: 6 (ref 5.0–8.0)

## 2019-12-08 LAB — CBC WITH DIFFERENTIAL/PLATELET
Abs Immature Granulocytes: 0.04 10*3/uL (ref 0.00–0.07)
Basophils Absolute: 0 10*3/uL (ref 0.0–0.1)
Basophils Relative: 0 %
Eosinophils Absolute: 0.1 10*3/uL (ref 0.0–0.5)
Eosinophils Relative: 2 %
HCT: 24 % — ABNORMAL LOW (ref 36.0–46.0)
Hemoglobin: 7.6 g/dL — ABNORMAL LOW (ref 12.0–15.0)
Immature Granulocytes: 1 %
Lymphocytes Relative: 33 %
Lymphs Abs: 1.3 10*3/uL (ref 0.7–4.0)
MCH: 29.5 pg (ref 26.0–34.0)
MCHC: 31.7 g/dL (ref 30.0–36.0)
MCV: 93 fL (ref 80.0–100.0)
Monocytes Absolute: 0.2 10*3/uL (ref 0.1–1.0)
Monocytes Relative: 5 %
Neutro Abs: 2.4 10*3/uL (ref 1.7–7.7)
Neutrophils Relative %: 59 %
Platelets: 189 10*3/uL (ref 150–400)
RBC: 2.58 MIL/uL — ABNORMAL LOW (ref 3.87–5.11)
RDW: 15.8 % — ABNORMAL HIGH (ref 11.5–15.5)
WBC: 4.1 10*3/uL (ref 4.0–10.5)
nRBC: 0 % (ref 0.0–0.2)

## 2019-12-08 LAB — COMPREHENSIVE METABOLIC PANEL
ALT: 36 U/L (ref 0–44)
AST: 18 U/L (ref 15–41)
Albumin: 3.2 g/dL — ABNORMAL LOW (ref 3.5–5.0)
Alkaline Phosphatase: 150 U/L — ABNORMAL HIGH (ref 38–126)
Anion gap: 10 (ref 5–15)
BUN: 31 mg/dL — ABNORMAL HIGH (ref 6–20)
CO2: 23 mmol/L (ref 22–32)
Calcium: 9 mg/dL (ref 8.9–10.3)
Chloride: 108 mmol/L (ref 98–111)
Creatinine, Ser: 1.2 mg/dL — ABNORMAL HIGH (ref 0.44–1.00)
GFR calc Af Amer: 58 mL/min — ABNORMAL LOW (ref >60)
GFR calc non Af Amer: 50 mL/min — ABNORMAL LOW (ref >60)
Glucose, Bld: 97 mg/dL (ref 70–99)
Potassium: 3.1 mmol/L — ABNORMAL LOW (ref 3.5–5.1)
Sodium: 141 mmol/L (ref 135–145)
Total Bilirubin: 1 mg/dL (ref 0.3–1.2)
Total Protein: 6.6 g/dL (ref 6.5–8.1)

## 2019-12-08 LAB — I-STAT BETA HCG BLOOD, ED (MC, WL, AP ONLY): I-stat hCG, quantitative: <5 m[IU]/mL (ref <5)

## 2019-12-08 LAB — LIPASE, BLOOD: Lipase: 26 U/L (ref 11–51)

## 2019-12-08 MED ORDER — ONDANSETRON HCL 4 MG/2ML IJ SOLN
2.0000 mg | Freq: Once | INTRAMUSCULAR | Status: AC
  Administered 2019-12-08: 2 mg via INTRAVENOUS
  Filled 2019-12-08: qty 2

## 2019-12-08 MED ORDER — HYDROMORPHONE HCL 1 MG/ML IJ SOLN
1.0000 mg | Freq: Once | INTRAMUSCULAR | Status: AC
  Administered 2019-12-08: 1 mg via INTRAVENOUS
  Filled 2019-12-08: qty 1

## 2019-12-08 MED ORDER — HYDROMORPHONE HCL 1 MG/ML IJ SOLN: 0.5000 mg | Freq: Once | INTRAMUSCULAR | Status: DC

## 2019-12-08 MED ORDER — METRONIDAZOLE 500 MG PO TABS
500.0000 mg | ORAL_TABLET | Freq: Once | ORAL | Status: AC
  Administered 2019-12-08: 500 mg via ORAL
  Filled 2019-12-08: qty 1

## 2019-12-08 MED ORDER — CIPROFLOXACIN HCL 500 MG PO TABS
500.0000 mg | ORAL_TABLET | Freq: Once | ORAL | Status: AC
  Administered 2019-12-08: 500 mg via ORAL
  Filled 2019-12-08: qty 1

## 2019-12-08 MED ORDER — POTASSIUM CHLORIDE 10 MEQ/100ML IV SOLN
10.0000 meq | Freq: Once | INTRAVENOUS | Status: DC
  Filled 2019-12-08: qty 100

## 2019-12-08 MED ORDER — POTASSIUM CHLORIDE ER 10 MEQ PO TBCR: 20.0000 meq | EXTENDED_RELEASE_TABLET | Freq: Every day | ORAL | 0 refills | Status: DC

## 2019-12-08 MED ORDER — POTASSIUM CHLORIDE CRYS ER 20 MEQ PO TBCR
40.0000 meq | EXTENDED_RELEASE_TABLET | Freq: Once | ORAL | Status: AC
  Administered 2019-12-08: 40 meq via ORAL
  Filled 2019-12-08: qty 2

## 2019-12-08 MED ORDER — IOHEXOL 300 MG/ML  SOLN
100.0000 mL | Freq: Once | INTRAMUSCULAR | Status: AC | PRN
  Administered 2019-12-08: 80 mL via INTRAVENOUS

## 2019-12-08 MED ORDER — HEPARIN SOD (PORK) LOCK FLUSH 100 UNIT/ML IV SOLN
INTRAVENOUS | Status: AC
  Filled 2019-12-08: qty 5

## 2019-12-08 MED ORDER — CIPROFLOXACIN HCL 500 MG PO TABS: 500.0000 mg | ORAL_TABLET | Freq: Two times a day (BID) | ORAL | 0 refills | Status: AC

## 2019-12-08 MED ORDER — SODIUM CHLORIDE (PF) 0.9 % IJ SOLN
INTRAMUSCULAR | Status: AC
  Filled 2019-12-08: qty 50

## 2019-12-08 MED ORDER — HYDROMORPHONE HCL 1 MG/ML IJ SOLN
0.5000 mg | Freq: Once | INTRAMUSCULAR | Status: AC
  Administered 2019-12-08: 0.5 mg via INTRAVENOUS
  Filled 2019-12-08: qty 1

## 2019-12-08 MED ORDER — METRONIDAZOLE 500 MG PO TABS: 500.0000 mg | ORAL_TABLET | Freq: Three times a day (TID) | ORAL | 0 refills | Status: AC

## 2019-12-08 MED ORDER — HYDROCODONE-ACETAMINOPHEN 5-325 MG PO TABS
1.0000 | ORAL_TABLET | Freq: Once | ORAL | Status: DC
  Filled 2019-12-08: qty 1

## 2019-12-08 NOTE — Discharge Instructions (Addendum)
You have been diagnosed today with colitis, asymptomatic anemia, hypokalemia.  At this time there does not appear to be the presence of an emergent medical condition, however there is always the potential for conditions to change. Please read and follow the below instructions.  Please return to the Emergency Department immediately for any new or worsening symptoms. Please be sure to follow up with your Primary Care Provider within one week regarding your visit today; please call their office to schedule an appointment even if you are feeling better for a follow-up visit. Please take the antibiotics Cipro and Flagyl as prescribed to help with your symptoms.  Please drink plenty of water and get plenty of rest.  Please call your gastroenterologist tomorrow morning to schedule a follow-up appointment for this week for reevaluation. Your potassium level was low today, please take the potassium supplement as prescribed.  Additionally your hemoglobin level was low today as well.  Please discuss these levels with your primary care provider and your gastroenterologist at your next visit as repeat blood work will be needed for recheck. Additionally your CT scan today showed atherosclerosis discussed this with your primary care provider at your next visit.  Get help right away if you: Have a fever that does not go away with treatment. Develop chills. Have extreme weakness, fainting, or dehydration. Have repeated vomiting. Develop severe pain in your abdomen. Pass bloody or tarry stool. Have chest pain. Have shortness of breath. Have vomiting or diarrhea that lasts for more than 2 days. Faint. You are very weak. You are short of breath. You have pain in your abdomen or chest. You are dizzy or feel faint. You have trouble concentrating. You have bloody or black, tarry stools. You vomit repeatedly or you vomit up blood. You have any new/concerning or worsening of symptoms  Please read the additional  information packets attached to your discharge summary.  Do not take your medicine if  develop an itchy rash, swelling in your mouth or lips, or difficulty breathing; call 911 and seek immediate emergency medical attention if this occurs.  Note: Portions of this text may have been transcribed using voice recognition software. Every effort was made to ensure accuracy; however, inadvertent computerized transcription errors may still be present.

## 2019-12-08 NOTE — ED Notes (Signed)
Pt ambulatory to and from bathroom with a steady gait. Standby assistance from staff.

## 2019-12-08 NOTE — ED Notes (Addendum)
Pt back from radiology. Pt requesting pain and nausea medication. PA notified

## 2019-12-10 ENCOUNTER — Other Ambulatory Visit: Payer: Self-pay | Admitting: Internal Medicine

## 2019-12-10 ENCOUNTER — Telehealth: Payer: Self-pay | Admitting: *Deleted

## 2019-12-10 MED ORDER — PANCRELIPASE (LIP-PROT-AMYL) 36000-114000 UNITS PO CPEP: 108000.0000 [IU] | ORAL_CAPSULE | Freq: Three times a day (TID) | ORAL | Status: DC

## 2019-12-10 MED ORDER — BUDESONIDE 3 MG PO CPEP: 9.0000 mg | ORAL_CAPSULE | Freq: Every day | ORAL | Status: DC

## 2019-12-10 MED ORDER — ESTRADIOL 2 MG PO TABS: 2.0000 mg | ORAL_TABLET | Freq: Every day | ORAL | 1 refills | Status: DC

## 2019-12-10 NOTE — Telephone Encounter (Signed)
Copied from [place] [phone]. Topic: General - Other >> Dec 10, 2019  8:15 AM Keene Breath wrote: Reason for CRM: Patient refused PT services and nursing services. Any questions please call at [phone]

## 2019-12-10 NOTE — Telephone Encounter (Signed)
Requested medication (s) are due for refill today:  yes  Requested medication (s) are on the active medication list:  yes  Future visit scheduled:  Yes  Last Refill: Estradiol- filled by Historical provider                   Creon- 10/31/19; #270; stated pt. Taking differently (3 tid?)                    Budesonide   Requested Prescriptions  Pending Prescriptions Disp Refills   estradiol (ESTRACE) 2 MG tablet       Sig: Take 1 tablet (2 mg total) by mouth daily.      OB/GYN:  Estrogens Failed - 12/10/2019 11:13 AM      Failed - Last BP in normal range    BP Readings from Last 1 Encounters:  12/08/19 (!) 151/64          Passed - Mammogram is up-to-date per Health Maintenance      Passed - Valid encounter within last 12 months    Recent Outpatient Visits           1 month ago Crohn's disease with other complication, unspecified gastrointestinal tract location [facility])   [place] at [facility], [name], MD       Future Appointments             In 5 days [name], [name], MD [place] at [place], [place]   In [place], MD [employer] [facility] at [place], [facility]              budesonide (ENTOCORT EC) 3 MG 24 hr capsule       Sig: Take 3 capsules (9 mg total) by mouth daily.      Not Delegated - Gastroenterology: Inflammatory Bowel Disease (Oral) - budesonide Failed - 12/10/2019 11:13 AM      Failed - This refill cannot be delegated      Failed - Last BP in normal range    BP Readings from Last 1 Encounters:  12/08/19 (!) 151/64          Passed - Valid encounter within last 6 months    Recent Outpatient Visits           1 month ago Crohn's disease with other complication, unspecified gastrointestinal tract location [facility])   [place] at [facility], [name], MD       Future Appointments             In 5 days [name], [name], MD [place] at  [place], [place]   In [place], [name], MD [employer] at [place], [facility]              lipase/protease/amylase (CREON) 36000 UNITS CPEP capsule 270 capsule     Sig: Take 1 capsule (36,000 Units total) by mouth 3 (three) times daily before meals.      Off-Protocol Failed - 12/10/2019 11:13 AM      Failed - Medication not assigned to a protocol, review manually.      Passed - Valid encounter within last 12 months    Recent Outpatient Visits           1 month ago Crohn's disease with other complication, unspecified gastrointestinal tract location [facility])   [place] at [facility], [name], MD       Future Appointments  In 5 days [name], [name], MD [place] at [place], [place]   In 1 month [name], [name], MD [employer] at [place], [facility]

## 2019-12-10 NOTE — Telephone Encounter (Signed)
Medication Refill - Medication: estradiol (ESTRACE) 2 MG tablet, lipase/protease/amylase (CREON) 36000 UNITS CPEP capsule, budesonide (ENTOCORT EC) 3 MG 24 hr capsule   Has the patient contacted their pharmacy? No. Pt states she is completely out of these medications. Please advise.  (Agent: If no, request that the patient contact the pharmacy for the refill.) (Agent: If yes, when and what did the pharmacy advise?)  Preferred Pharmacy (with phone number or street name):  [place], [address] [place].  [place]. [place] [address]  Phone: [phone] Fax: [phone]  Not a 24 hour pharmacy; exact hours not known.    Agent: Please be advised that RX refills may take up to 3 business days. We ask that you follow-up with your pharmacy.

## 2019-12-14 ENCOUNTER — Encounter (HOSPITAL_COMMUNITY): Payer: Self-pay | Admitting: Emergency Medicine

## 2019-12-14 ENCOUNTER — Other Ambulatory Visit: Payer: Self-pay

## 2019-12-14 DIAGNOSIS — N183 Chronic kidney disease, stage 3 unspecified: Secondary | ICD-10-CM | POA: Diagnosis not present

## 2019-12-14 DIAGNOSIS — R1084 Generalized abdominal pain: Secondary | ICD-10-CM | POA: Diagnosis not present

## 2019-12-14 DIAGNOSIS — Z79899 Other long term (current) drug therapy: Secondary | ICD-10-CM | POA: Diagnosis not present

## 2019-12-14 DIAGNOSIS — R5383 Other fatigue: Secondary | ICD-10-CM | POA: Insufficient documentation

## 2019-12-14 DIAGNOSIS — R197 Diarrhea, unspecified: Secondary | ICD-10-CM | POA: Insufficient documentation

## 2019-12-14 DIAGNOSIS — I129 Hypertensive chronic kidney disease with stage 1 through stage 4 chronic kidney disease, or unspecified chronic kidney disease: Secondary | ICD-10-CM | POA: Insufficient documentation

## 2019-12-14 DIAGNOSIS — R109 Unspecified abdominal pain: Secondary | ICD-10-CM | POA: Diagnosis present

## 2019-12-14 LAB — URINALYSIS, ROUTINE W REFLEX MICROSCOPIC
Bilirubin Urine: NEGATIVE
Glucose, UA: NEGATIVE mg/dL
Hgb urine dipstick: NEGATIVE
Ketones, ur: NEGATIVE mg/dL
Nitrite: NEGATIVE
Protein, ur: 30 mg/dL — AB
Specific Gravity, Urine: 1.019 (ref 1.005–1.030)
pH: 7 (ref 5.0–8.0)

## 2019-12-14 LAB — COMPREHENSIVE METABOLIC PANEL
ALT: 22 U/L (ref 0–44)
AST: 27 U/L (ref 15–41)
Albumin: 3.6 g/dL (ref 3.5–5.0)
Alkaline Phosphatase: 125 U/L (ref 38–126)
Anion gap: 9 (ref 5–15)
BUN: 37 mg/dL — ABNORMAL HIGH (ref 6–20)
CO2: 28 mmol/L (ref 22–32)
Calcium: 9.7 mg/dL (ref 8.9–10.3)
Chloride: 102 mmol/L (ref 98–111)
Creatinine, Ser: 1.21 mg/dL — ABNORMAL HIGH (ref 0.44–1.00)
GFR calc Af Amer: 57 mL/min — ABNORMAL LOW (ref >60)
GFR calc non Af Amer: 49 mL/min — ABNORMAL LOW (ref >60)
Glucose, Bld: 95 mg/dL (ref 70–99)
Potassium: 3.7 mmol/L (ref 3.5–5.1)
Sodium: 139 mmol/L (ref 135–145)
Total Bilirubin: 0.7 mg/dL (ref 0.3–1.2)
Total Protein: 7.3 g/dL (ref 6.5–8.1)

## 2019-12-14 LAB — CBC
HCT: 29.5 % — ABNORMAL LOW (ref 36.0–46.0)
Hemoglobin: 9.2 g/dL — ABNORMAL LOW (ref 12.0–15.0)
MCH: 29.4 pg (ref 26.0–34.0)
MCHC: 31.2 g/dL (ref 30.0–36.0)
MCV: 94.2 fL (ref 80.0–100.0)
Platelets: 222 10*3/uL (ref 150–400)
RBC: 3.13 MIL/uL — ABNORMAL LOW (ref 3.87–5.11)
RDW: 15.8 % — ABNORMAL HIGH (ref 11.5–15.5)
WBC: 3.6 10*3/uL — ABNORMAL LOW (ref 4.0–10.5)
nRBC: 0 % (ref 0.0–0.2)

## 2019-12-14 LAB — I-STAT BETA HCG BLOOD, ED (MC, WL, AP ONLY): I-stat hCG, quantitative: <5 m[IU]/mL (ref <5)

## 2019-12-14 LAB — LIPASE, BLOOD: Lipase: 23 U/L (ref 11–51)

## 2019-12-14 NOTE — ED Notes (Signed)
Patient has IV in leg-poor veins for IV stick

## 2019-12-14 NOTE — ED Triage Notes (Signed)
Per [facility] pt from home for abd pains, fatigue and diarrhea for week. Pt has TPN line in her leg.  144/60, 64HR, 100% on RA, CBG 120, 98.1.

## 2019-12-15 ENCOUNTER — Emergency Department (HOSPITAL_COMMUNITY)
Admission: EM | Admit: 2019-12-15 | Discharge: 2019-12-15 | Disposition: A | Discharge status: Home / Self Care | Payer: Medicare Other | Attending: Emergency Medicine | Admitting: Emergency Medicine

## 2019-12-15 ENCOUNTER — Ambulatory Visit (INDEPENDENT_AMBULATORY_CARE_PROVIDER_SITE_OTHER): Payer: Medicare Other | Admitting: Internal Medicine

## 2019-12-15 ENCOUNTER — Encounter (HOSPITAL_COMMUNITY): Payer: Self-pay | Admitting: Emergency Medicine

## 2019-12-15 ENCOUNTER — Emergency Department (HOSPITAL_COMMUNITY)
Admission: EM | Admit: 2019-12-15 | Discharge: 2019-12-15 | Disposition: A | Discharge status: Home / Self Care | Payer: Medicare Other | Source: Home / Self Care | Attending: Emergency Medicine | Admitting: Emergency Medicine

## 2019-12-15 ENCOUNTER — Other Ambulatory Visit: Payer: Self-pay

## 2019-12-15 DIAGNOSIS — Z79899 Other long term (current) drug therapy: Secondary | ICD-10-CM | POA: Insufficient documentation

## 2019-12-15 DIAGNOSIS — R110 Nausea: Secondary | ICD-10-CM

## 2019-12-15 DIAGNOSIS — G894 Chronic pain syndrome: Secondary | ICD-10-CM | POA: Diagnosis not present

## 2019-12-15 DIAGNOSIS — N183 Chronic kidney disease, stage 3 unspecified: Secondary | ICD-10-CM | POA: Insufficient documentation

## 2019-12-15 DIAGNOSIS — K219 Gastro-esophageal reflux disease without esophagitis: Secondary | ICD-10-CM

## 2019-12-15 DIAGNOSIS — K50818 Crohn's disease of both small and large intestine with other complication: Secondary | ICD-10-CM | POA: Diagnosis not present

## 2019-12-15 DIAGNOSIS — R1084 Generalized abdominal pain: Secondary | ICD-10-CM

## 2019-12-15 DIAGNOSIS — I129 Hypertensive chronic kidney disease with stage 1 through stage 4 chronic kidney disease, or unspecified chronic kidney disease: Secondary | ICD-10-CM | POA: Insufficient documentation

## 2019-12-15 DIAGNOSIS — F3341 Major depressive disorder, recurrent, in partial remission: Secondary | ICD-10-CM | POA: Diagnosis not present

## 2019-12-15 DIAGNOSIS — E43 Unspecified severe protein-calorie malnutrition: Secondary | ICD-10-CM

## 2019-12-15 MED ORDER — HYDROMORPHONE HCL 1 MG/ML IJ SOLN
2.0000 mg | Freq: Once | INTRAMUSCULAR | Status: AC
  Administered 2019-12-15: 11:00:00 2 mg via INTRAVENOUS
  Filled 2019-12-15: qty 2

## 2019-12-15 MED ORDER — SODIUM CHLORIDE 0.9 % IV BOLUS
1000.0000 mL | Freq: Once | INTRAVENOUS | Status: AC
  Administered 2019-12-15: 1000 mL via INTRAVENOUS

## 2019-12-15 MED ORDER — PROMETHAZINE HCL 25 MG/ML IJ SOLN
12.5000 mg | Freq: Once | INTRAMUSCULAR | Status: AC
  Administered 2019-12-15: 12.5 mg via INTRAVENOUS
  Filled 2019-12-15: qty 1

## 2019-12-15 MED ORDER — PROCHLORPERAZINE EDISYLATE 10 MG/2ML IJ SOLN
10.0000 mg | Freq: Once | INTRAMUSCULAR | Status: AC
  Administered 2019-12-15: 10 mg via INTRAVENOUS
  Filled 2019-12-15: qty 2

## 2019-12-15 MED ORDER — BUPROPION HCL ER (SR) 100 MG PO TB12: 200.0000 mg | ORAL_TABLET | Freq: Every day | ORAL | 2 refills | Status: DC

## 2019-12-15 MED ORDER — SUCRALFATE 1 GM/10ML PO SUSP: 1.0000 g | Freq: Three times a day (TID) | ORAL | 2 refills | Status: DC

## 2019-12-15 MED ORDER — HYDROMORPHONE HCL 1 MG/ML IJ SOLN
2.0000 mg | Freq: Once | INTRAMUSCULAR | Status: AC
  Administered 2019-12-15: 14:00:00 2 mg via INTRAVENOUS
  Filled 2019-12-15: qty 2

## 2019-12-15 MED ORDER — PANCRELIPASE (LIP-PROT-AMYL) 36000-114000 UNITS PO CPEP: 108000.0000 [IU] | ORAL_CAPSULE | Freq: Three times a day (TID) | ORAL | 1 refills | Status: AC

## 2019-12-15 MED ORDER — BUDESONIDE 3 MG PO CPEP: 9.0000 mg | ORAL_CAPSULE | Freq: Every day | ORAL | 2 refills | Status: DC

## 2019-12-15 NOTE — ED Notes (Signed)
Got patient another warm blanket patient is resting with call bell in reach

## 2019-12-15 NOTE — ED Notes (Signed)
While in lobby PT info this writer she will be leaving her son is on his way to pick her up. RN have been made aware

## 2019-12-15 NOTE — ED Notes (Signed)
Got patient into a gown on the monitor patient is resting with call bell in reach got patient a warm blanket  °

## 2019-12-15 NOTE — ED Provider Notes (Signed)
[facility] EMERGENCY DEPARTMENT Provider Note    CSN: [id number] Arrival date & time: 12/15/19  1001     History Chief Complaint  Patient presents with  . Emesis  . Diarrhea    Caitlin Peters is a very pleasant 59 y.o. female w/ an unfortunate history of severe Crohn's disease complicated by bowel resections, short-gut syndrome, multiple line infections, and TPN dependency for several years, presenting to the ED with abdominal pain.  The patient was discharged from the hospital most recently on 1/4, found to have ascending cholangitis with enterobacter bacteremia, underwent ERCP with sphincteromy at that time.  She was discharged and subsequently seen in the ED on 1/10 for abdominal pain and emesis and rectal bleeding.  She had a CT abd/pelvis performed on 1/12 consistent with possible focal colitis, and was treated with cipro and flagyl, which she is finishing now.  She returns to the ED complaining of worsening abdominal pain, difficultly keeping down food, and weakness.  She tried coming to [facility] ED yesterday but left after a reported 14 hour wait.  Her bloodwork at that time showed stable CMP (Cr at baseline 1.2, K+ 3.7, Na 139), stable LFT's, no anion gap, normal lipase, and stable CBC (hgb 9.2 above her baseline, CBC 3.6).   She has a chronic left femoral picc line, and tells me this is her last available vascular access site.  She receives TPN through this.   HPI     Past Medical History:  Diagnosis Date  . Anasarca 10/2019  . AVN (avascular necrosis of bone) ([place])   . Cataract   . Chronic pain syndrome   . CKD (chronic kidney disease), stage III   . Crohn disease ([place])   . Crohn disease ([place])   . Depression   . Diverticulosis   . GERD (gastroesophageal reflux disease)   . HTN (hypertension)   . IDA (iron deficiency anemia)   . Malnutrition ([place])   . Mass in chest   . Osteoporosis   . Pancreatitis   . Short gut syndrome   . Vitamin B12 deficiency      Patient Active Problem List   Diagnosis Date Noted  . Cholangitis   . Generalized abdominal pain   . Intractable abdominal pain 11/25/2019  . Anasarca 10/28/2019  . Acute kidney injury superimposed on chronic kidney disease ([place]) 10/28/2019  . Falls 10/28/2019  . Crohn disease ([place])   . Malnutrition ([place])   . Short gut syndrome   . HTN (hypertension)   . Vitamin B12 deficiency   . Osteoporosis   . Depression   . GERD (gastroesophageal reflux disease)   . Chronic pain syndrome   . Hypokalemia due to excessive gastrointestinal loss of potassium 10/13/2019  . Diarrhea   . Fever   . Hypokalemia 10/12/2019  . Chronic diarrhea 10/12/2019  . Dysuria 10/12/2019  . Bilateral lower extremity edema 10/12/2019  . Crohn's disease ([place]) 10/12/2019  . Anemia 10/12/2019  . Hypertension 10/12/2019  . AKI (acute kidney injury) ([place]) 10/12/2019    Past Surgical History:  Procedure Laterality Date  . ABDOMINAL HYSTERECTOMY    . ABDOMINAL SURGERY     colon resection with abdominal stoma, repair of intestinal leak, reversal of abdominal stoma  . BILIARY DILATION  11/26/2019   Procedure: BILIARY DILATION;  Surgeon: Jackquline Denmark, MD;  Location: [facility] ENDOSCOPY;  Service: Endoscopy;;  . CHEST WALL RESECTION     right thoracotomy,resection of chest mass with anterior rib and reconstruction  using prosthetic mesh and video arthroscopy  . CHOLECYSTECTOMY    . COLONOSCOPY  2019  . ERCP N/A 11/26/2019   Procedure: ENDOSCOPIC RETROGRADE CHOLANGIOPANCREATOGRAPHY (ERCP);  Surgeon: Jackquline Denmark, MD;  Location: [facility];  Service: Endoscopy;  Laterality: N/A;  . IR US GUIDE VASC ACCESS LEFT     x 2 06/17/19 and 09/14/2019  . KNEE SURGERY     right knee   . REMOVAL OF STONES  11/26/2019   Procedure: REMOVAL OF STONES;  Surgeon: Jackquline Denmark, MD;  Location: [facility] ENDOSCOPY;  Service: Endoscopy;;  . SMALL INTESTINE SURGERY     x3  . SPHINCTEROTOMY  11/26/2019   Procedure: SPHINCTEROTOMY;  Surgeon:  Jackquline Denmark, MD;  Location: [facility];  Service: Endoscopy;;  . UPPER GASTROINTESTINAL ENDOSCOPY       OB History   No obstetric history on file.     Family History  Problem Relation Age of Onset  . Breast cancer Sister   . Multiple sclerosis Sister   . Diabetes Sister   . Lupus Sister   . Colon cancer Other   . Crohn's disease Other   . Seizures Mother   . Glaucoma Mother   . CAD Father   . Heart disease Father   . Hypertension Father     Social History   Tobacco Use  . Smoking status: Former Research scientist (life sciences)  . Smokeless tobacco: Never Used  Substance Use Topics  . Alcohol use: Not Currently  . Drug use: Never    Home Medications Prior to Admission medications   Medication Sig Start Date End Date Taking? Authorizing Provider  acetaminophen (TYLENOL) 325 MG tablet Take 650 mg by mouth every 6 (six) hours as needed for mild pain.    Yes [provider]  amLODipine (NORVASC) 5 MG tablet Take 1 tablet (5 mg total) by mouth daily. 10/16/19  Yes [name], [name], MD  budesonide (ENTOCORT EC) 3 MG 24 hr capsule Take 3 capsules (9 mg total) by mouth daily. 12/15/19 03/14/20 Yes Erline Hau, MD  buPROPion [facility] SR) 100 MG 12 hr tablet Take 2 tablets (200 mg total) by mouth daily. 12/15/19 03/14/20 Yes Erline Hau, MD  calcium carbonate (OS-CAL) 1250 (500 Ca) MG chewable tablet Chew 1 tablet by mouth 2 (two) times daily.   Yes [provider]  cholecalciferol (VITAMIN D3) 25 MCG (1000 UT) tablet Take 2,000 Units by mouth daily.   Yes [provider]  cycloSPORINE (RESTASIS) 0.05 % ophthalmic emulsion Place 1 drop into both eyes 2 (two) times daily.   Yes [provider]  dexlansoprazole (DEXILANT) 60 MG capsule Take 1 capsule (60 mg total) by mouth daily. 10/16/19  Yes [name], [name], MD  Dextran 70-Hypromellose 0.1-0.3 % SOLN Place 1 drop into both eyes 4 (four) times daily.   Yes [provider]  dicyclomine (BENTYL) 20 MG tablet Take 20 mg by mouth 3 (three) times daily. 10/26/19  Yes [provider]  diphenoxylate-atropine (LOMOTIL) 2.5-0.025 MG tablet Take 1 tablet by mouth 4 (four) times daily as needed for diarrhea or loose stools. 10/31/19  Yes Dessa Phi, DO  DULoxetine (CYMBALTA) 30 MG capsule Take 3 capsules (90 mg total) by mouth daily. 11/01/19  Yes Dessa Phi, DO  estradiol (ESTRACE) 2 MG tablet Take 1 tablet (2 mg total) by mouth daily. 12/10/19  Yes [name], [name], MD  HYDROmorphone (DILAUDID) 4 MG tablet Take 1 tablet (4 mg total) by mouth  every 6 (six) hours as needed for severe pain. 12/06/19  Yes Ripley Fraise, MD  lipase/protease/amylase (CREON) 36000 UNITS CPEP capsule Take 3 capsules (108,000 Units total) by mouth 3 (three) times daily before meals. 12/15/19 03/14/20 Yes Erline Hau, MD  loperamide (IMODIUM) 2 MG capsule Take 1 capsule (2 mg total) by mouth as needed for diarrhea or loose stools. 10/16/19  Yes [name], [name], MD  metoprolol tartrate (LOPRESSOR) 100 MG tablet Take 100 mg by mouth 2 (two) times daily.   Yes [provider]  Multiple Vitamins-Minerals (MULTIVITAMIN ADULT PO) Take 1 tablet by mouth daily.   Yes [provider]  Omega-3 1000 MG CAPS Take 1 capsule by mouth daily.   Yes [provider]  potassium chloride SA (KLOR-CON) 20 MEQ tablet Take 2 tablets (40 mEq total) by mouth daily. 10/16/19  Yes Erline Hau, MD  PRESCRIPTION MEDICATION Home TPN [employer] in [facility]   Yes [provider]  Probiotic Product (PROBIOTIC-10 PO) Take 1 capsule by mouth daily.   Yes [provider]  promethazine (PHENERGAN) 25 MG tablet Take 1 tablet (25 mg total) by mouth every 6 (six) hours as needed for nausea or vomiting. 11/19/19  Yes [name], [name], MD  sucralfate (CARAFATE) 1 GM/10ML suspension Take 10 mLs (1 g total)  by mouth 4 (four) times daily -  with meals and at bedtime. 12/15/19  Yes [name], [name], MD  traZODone (DESYREL) 100 MG tablet Take 2 tablets (200 mg total) by mouth at bedtime as needed for sleep. 10/31/19  Yes Dessa Phi, DO  vitamin B-12 (CYANOCOBALAMIN) 100 MCG tablet Take 1,000 mcg by mouth daily.    Yes [provider]  methadone (DOLOPHINE) 10 MG tablet Take 2 tablets (20 mg total) by mouth every 12 (twelve) hours. Patient not taking: Reported on 12/15/2019 11/30/19   Shelly Coss, MD  potassium chloride (KLOR-CON) 10 MEQ tablet Take 2 tablets (20 mEq total) by mouth daily for 2 days. Patient not taking: Reported on 12/15/2019 12/08/19 12/15/19  Nuala Alpha A, PA-C    Allergies    Cefepime, Gabapentin, Hyoscyamine, Lyrica [pregabalin], Meperidine, Topamax [topiramate], Fentanyl, and Morphine and related  Review of Systems   Review of Systems  Constitutional: Positive for fatigue. Negative for chills.  Respiratory: Negative for cough and shortness of breath.   Cardiovascular: Negative for chest pain and palpitations.  Gastrointestinal: Positive for abdominal pain, diarrhea and vomiting. Negative for blood in stool.  Neurological: Negative for syncope and light-headedness.  Psychiatric/Behavioral: Negative for agitation and confusion.  All other systems reviewed and are negative.   Physical Exam Updated Vital Signs BP (!) 160/71   Pulse 62   Temp 98.5 F (36.9 C) (Oral)   Resp 15   Ht 5' 8"  (1.727 m)   Wt 59 kg   SpO2 100%   BMI 19.77 kg/m   Physical Exam Vitals and nursing note reviewed.  Constitutional:      General: She is not in acute distress.    Appearance: She is well-developed.     Comments: Thin  HENT:     Head: Normocephalic and atraumatic.  Eyes:     Conjunctiva/sclera: Conjunctivae normal.  Cardiovascular:     Rate and Rhythm: Normal rate and regular rhythm.     Pulses: Normal pulses.  Pulmonary:     Effort: Pulmonary effort  is normal. No respiratory distress.     Breath sounds: Normal breath sounds.  Abdominal:     General: There is no distension.     Palpations: Abdomen is soft.  Musculoskeletal:     Cervical back: Neck supple.  Skin:    General: Skin is warm and dry.     Comments: Left femoral picc line Multiple scars at line insertion sites along subclavian sites and upper arms  Neurological:     Mental Status: She is alert.  Psychiatric:        Mood and Affect: Mood normal.        Behavior: Behavior normal.     ED Results / Procedures / Treatments   Labs (all labs ordered are listed, but only abnormal results are displayed) Labs Reviewed - No data to display  EKG None  Radiology No results found.  Procedures Procedures (including critical care time)  Medications Ordered in ED Medications  HYDROmorphone (DILAUDID) injection 2 mg (2 mg Intravenous Given 12/15/19 1120)  promethazine (PHENERGAN) injection 12.5 mg (12.5 mg Intravenous Given 12/15/19 1123)  sodium chloride 0.9 % bolus 1,000 mL (0 mLs Intravenous Stopped 12/15/19 1341)  HYDROmorphone (DILAUDID) injection 2 mg (2 mg Intravenous Given 12/15/19 1345)  prochlorperazine (COMPAZINE) injection 10 mg (10 mg Intravenous Given 12/15/19 1347)    ED Course  I have reviewed the triage vital signs and the nursing notes.  Pertinent labs & imaging results that were available during my care of the patient were reviewed by me and considered in my medical decision making (see chart for details).  59 yo female w/ history as noted above presenting to the ED with abdominal pain, cramping, weakness, and poor PO intake.  She struggles with very severe crohn's disease and short-gut syndrome, and receives TPN supplemental nutrition (though she does take PO as well).    She is overall pleasant and well appearing on exam.  Her vitals are wnl.  We reviewed her labs from yesterday which were reassuring - I do not suspect she is septic or has a crohn's flare  with these numbers, and she does suggest that this is more of her chronic pain than a new process.  She is still finishing her flagyl course for colitis treated last week.  I do not believe she needs repeat bloodwork, repeat CT, or additional emergent workup given her clinical presentation.  We'll give her IV fluids, nausea meds, and dilaudid. She feels like this combination will help get her over the hump to tolerating PO fully again, and is grateful to forego a repeat workup at this time.   She is working on finding a Designer, multimedia at [employer] (her prior doctors were at [facility] before she moved her).  I do sympathize with her condition and wish her well.  Clinical Course as of Dec 14 1754  Tue Dec 15, 2019  1457 Feeling much better, will discharge   [MT]    Clinical Course User Index [MT] Maxx Calaway, Carola Rhine, MD    Final Clinical Impression(s) / ED Diagnoses Final diagnoses:  Generalized abdominal pain  Nausea    Rx / DC Orders ED Discharge Orders    None       Wyvonnia Dusky, MD 12/15/19 1757

## 2019-12-15 NOTE — Progress Notes (Signed)
Virtual Visit via Telephone Note  I connected with Caitlin Peters on 12/15/19 at  8:00 AM EST by telephone and verified that I am speaking with the correct person using two identifiers.   I discussed the limitations, risks, security and privacy concerns of performing an evaluation and management service by telephone and the availability of in person appointments. I also discussed with the patient that there may be a patient responsible charge related to this service. The patient expressed understanding and agreed to proceed.  Location patient: home Location provider: work office Participants present for the call: patient, provider Patient did not have a visit in the prior 7 days to address this/these issue(s).   History of Present Illness:  She has scheduled this visit to discuss chronic concerns and for medication refills.  Since I last saw her she has been having continued issues secondary to her Crohn's disease.  She is now back on TPN, she was hospitalized in December with ascending cholangitis with Enterobacter bacteremia.  It appears she was not able to visit the pain clinic in time and she ran out of her chronic narcotics and went into mild withdrawals.  She has seen 1 pain management specialist but they do not prescribe methadone so she is seeing another one later this week.  She was referred to local GI who thought she was too complex and needed to be referred to a tertiary care center.  It appears there have been some delays in that referral she is also upset about that as well.  She has had continued issues with anorexia, decreased energy, abdominal pain, heartburn.  She went to the ED yesterday and left after she had been waiting for 14 hours.   Observations/Objective: Patient sounds cheerful and well on the phone. I do not appreciate any increased work of breathing. Speech and thought processing are grossly intact. Patient reported vitals: None reported   Current  Outpatient Medications:  .  acetaminophen (TYLENOL) 325 MG tablet, Take 650 mg by mouth every 6 (six) hours as needed for mild pain. , Disp: , Rfl:  .  amLODipine (NORVASC) 5 MG tablet, Take 1 tablet (5 mg total) by mouth daily., Disp: 90 tablet, Rfl: 1 .  budesonide (ENTOCORT EC) 3 MG 24 hr capsule, Take 3 capsules (9 mg total) by mouth daily., Disp: 90 capsule, Rfl: 2 .  buPROPion (WELLBUTRIN SR) 100 MG 12 hr tablet, Take 2 tablets (200 mg total) by mouth daily., Disp: 60 tablet, Rfl: 2 .  calcium carbonate (OS-CAL) 1250 (500 Ca) MG chewable tablet, Chew 1 tablet by mouth 2 (two) times daily., Disp: , Rfl:  .  cholecalciferol (VITAMIN D3) 25 MCG (1000 UT) tablet, Take 2,000 Units by mouth daily., Disp: , Rfl:  .  cycloSPORINE (RESTASIS) 0.05 % ophthalmic emulsion, Place 1 drop into both eyes 2 (two) times daily., Disp: , Rfl:  .  dexlansoprazole (DEXILANT) 60 MG capsule, Take 1 capsule (60 mg total) by mouth daily., Disp: 90 capsule, Rfl: 1 .  Dextran 70-Hypromellose 0.1-0.3 % SOLN, Place 1 drop into both eyes 4 (four) times daily., Disp: , Rfl:  .  dicyclomine (BENTYL) 20 MG tablet, Take 20 mg by mouth 3 (three) times daily., Disp: , Rfl:  .  diphenoxylate-atropine (LOMOTIL) 2.5-0.025 MG tablet, Take 1 tablet by mouth 4 (four) times daily as needed for diarrhea or loose stools., Disp: 30 tablet, Rfl: 0 .  DULoxetine (CYMBALTA) 30 MG capsule, Take 3 capsules (90 mg total) by  mouth daily., Disp: , Rfl: 3 .  estradiol (ESTRACE) 2 MG tablet, Take 1 tablet (2 mg total) by mouth daily., Disp: 90 tablet, Rfl: 1 .  HYDROmorphone (DILAUDID) 4 MG tablet, Take 1 tablet (4 mg total) by mouth every 6 (six) hours as needed for severe pain., Disp: 10 tablet, Rfl: 0 .  lipase/protease/amylase (CREON) 36000 UNITS CPEP capsule, Take 3 capsules (108,000 Units total) by mouth 3 (three) times daily before meals., Disp: 180 capsule, Rfl: 1 .  loperamide (IMODIUM) 2 MG capsule, Take 1 capsule (2 mg total) by mouth as  needed for diarrhea or loose stools., Disp: 90 capsule, Rfl: 0 .  methadone (DOLOPHINE) 10 MG tablet, Take 2 tablets (20 mg total) by mouth every 12 (twelve) hours. (Patient not taking: Reported on 12/15/2019), Disp: 20 tablet, Rfl: 0 .  metoprolol tartrate (LOPRESSOR) 100 MG tablet, Take 100 mg by mouth 2 (two) times daily., Disp: , Rfl:  .  Multiple Vitamins-Minerals (MULTIVITAMIN ADULT PO), Take 1 tablet by mouth daily., Disp: , Rfl:  .  potassium chloride SA (KLOR-CON) 20 MEQ tablet, Take 2 tablets (40 mEq total) by mouth daily., Disp: 90 tablet, Rfl: 1 .  Probiotic Product (PROBIOTIC-10 PO), Take 1 capsule by mouth daily., Disp: , Rfl:  .  promethazine (PHENERGAN) 25 MG tablet, Take 1 tablet (25 mg total) by mouth every 6 (six) hours as needed for nausea or vomiting., Disp: 90 tablet, Rfl: 0 .  traZODone (DESYREL) 100 MG tablet, Take 2 tablets (200 mg total) by mouth at bedtime as needed for sleep., Disp:  , Rfl:  .  vitamin B-12 (CYANOCOBALAMIN) 100 MCG tablet, Take 1,000 mcg by mouth daily. , Disp: , Rfl:  .  Omega-3 1000 MG CAPS, Take 1 capsule by mouth daily., Disp: , Rfl:  .  potassium chloride (KLOR-CON) 10 MEQ tablet, Take 2 tablets (20 mEq total) by mouth daily for 2 days. (Patient not taking: Reported on 12/15/2019), Disp: 4 tablet, Rfl: 0 .  sucralfate (CARAFATE) 1 GM/10ML suspension, Take 10 mLs (1 g total) by mouth 4 (four) times daily -  with meals and at bedtime., Disp: 420 mL, Rfl: 2  Review of Systems:  Constitutional: Denies fever, chills, diaphoresis. HEENT: Denies photophobia, eye pain, redness, hearing loss, ear pain, congestion, sore throat, rhinorrhea, sneezing, mouth sores, trouble swallowing, neck pain, neck stiffness and tinnitus.   Respiratory: Denies SOB, DOE, cough, chest tightness,  and wheezing.   Cardiovascular: Denies chest pain, palpitations and leg swelling.  Gastrointestinal: Denies nausea, vomiting,  diarrhea, constipation, blood in stool and abdominal  distention.  Genitourinary: Denies dysuria, urgency, frequency, hematuria, flank pain and difficulty urinating.  Endocrine: Denies: hot or cold intolerance, sweats, changes in hair or nails, polyuria, polydipsia. Musculoskeletal: Denies myalgias, back pain, joint swelling, arthralgias and gait problem.  Skin: Denies pallor, rash and wound.  Neurological: Denies dizziness, seizures, syncope, weakness, light-headedness, numbness and headaches.  Hematological: Denies adenopathy. Easy bruising, personal or family bleeding history  Psychiatric/Behavioral: Denies suicidal ideation, mood changes, confusion, nervousness, sleep disturbance and agitation   Assessment and Plan:  Gastroesophageal reflux disease without esophagitis  -Still having issues despite Dexilant, refill Carafate, and Pepcid.  Crohn's disease of both small and large intestine with other complication ([place])  -She is requesting refills of Entocort and Creon. -Follow-up mainly with GI.  Recurrent major depressive disorder, in partial remission ([place]) -Refill her Wellbutrin.  Chronic pain syndrome -This will need to be managed by pain management specialist given chronic methadone.  Severe protein-calorie malnutrition ([place]) -Now back on TPN.    I discussed the assessment and treatment plan with the patient. The patient was provided an opportunity to ask questions and all were answered. The patient agreed with the plan and demonstrated an understanding of the instructions.   The patient was advised to call back or seek an in-person evaluation if the symptoms worsen or if the condition fails to improve as anticipated.  I provided 22 minutes of non-face-to-face time during this encounter.   [name], MD [facility]

## 2019-12-15 NOTE — ED Triage Notes (Signed)
C/o lower abd pain, vomiting, diarrhea, and body aches x 2 weeks.  States she has history of Crohn's.  Went to [facility] yesterday and LWBS after 14 hr wait.  Pt requesting referral for specialist for Crohn's.

## 2019-12-15 NOTE — ED Notes (Signed)
While this Probation officer was in the lobby, pt yelled at [employer] "I'm leaving since no one will take care of me". Pt encouraged to wait to be seen by a provider, as pt has been updated every few hours with vital signs and what she is waiting for; pt informed she was not being ignored. Pt continually going to registration desk every few minutes to ask to be seen after triage nursing staff updated pt on her wait time.

## 2019-12-15 NOTE — Discharge Instructions (Addendum)
Please follow up with your GI doctor, or your primary care doctor to set up with a Crohn's specialist.   You bloodwork in the ER yesterday was stable and did not show signs of new infection or inflammation.

## 2019-12-18 NOTE — Telephone Encounter (Signed)
Patient called and wanted you to know that she moved in with her Daughter in [place] and wanted to thank you for all of you kindness throughout the years.

## 2019-12-23 ENCOUNTER — Telehealth: Payer: Self-pay | Admitting: Gastroenterology

## 2019-12-23 ENCOUNTER — Other Ambulatory Visit: Payer: Self-pay | Admitting: Internal Medicine

## 2019-12-23 NOTE — Telephone Encounter (Signed)
Ruben Reason, pharmacist from [facility] requested to speak to you regarding this pt.

## 2019-12-23 NOTE — Telephone Encounter (Signed)
Spoke to Ms. Jeannine Kitten who will fax orders for TPN from 11/02/19,11/10/19, and 11/30/19 that need to be signed by Dr. Tarri Glenn and faxed back.

## 2019-12-23 NOTE — Telephone Encounter (Signed)
Left message for Ms. Jeannine Kitten to call back.

## 2019-12-29 ENCOUNTER — Ambulatory Visit: Payer: Medicare Other | Admitting: Gastroenterology

## 2019-12-30 ENCOUNTER — Telehealth: Payer: Self-pay | Admitting: Internal Medicine

## 2019-12-30 ENCOUNTER — Other Ambulatory Visit: Payer: Self-pay | Admitting: Internal Medicine

## 2019-12-30 NOTE — Telephone Encounter (Signed)
Patient needs refills on Loperamide and Trazadone. Patient has one day left.  Pharmacy: [facility] on [facility] [place]

## 2019-12-30 NOTE — Telephone Encounter (Signed)
Ok to refill for 30 days  

## 2019-12-30 NOTE — Telephone Encounter (Signed)
Attempted to call patient but unable to leave a message.

## 2019-12-30 NOTE — Telephone Encounter (Signed)
Patient is requesting a call back to discuss is it is ok to get the Covid vaccine or not.  She states she has had health issues and is unsure whether to get it.

## 2019-12-30 NOTE — Telephone Encounter (Signed)
Patient received a call--returning call to office.

## 2019-12-31 ENCOUNTER — Telehealth: Payer: Self-pay | Admitting: Gastroenterology

## 2019-12-31 MED ORDER — LOPERAMIDE HCL 2 MG PO CAPS: 2.0000 mg | ORAL_CAPSULE | ORAL | 0 refills | Status: DC | PRN

## 2019-12-31 MED ORDER — TRAZODONE HCL 100 MG PO TABS: 200.0000 mg | ORAL_TABLET | Freq: Every evening | ORAL | 0 refills | Status: DC | PRN

## 2019-12-31 NOTE — Telephone Encounter (Signed)
Beverly from [facility] is calling states they sent over three orders for TPN on this patient. Wanted to know if they have been received and\or taken care of.

## 2019-12-31 NOTE — Telephone Encounter (Signed)
Spoke with Rise Paganini and told [place] per [facility] CMA, Dr. Tarri Glenn transferred this patient and her care including all TPN orders to [facility] and to follow up with [facility].

## 2019-12-31 NOTE — Telephone Encounter (Signed)
Spoke with patient and advised her to get a Covid vaccine.

## 2019-12-31 NOTE — Telephone Encounter (Signed)
This patients care has been transferred to [facility] and they will take care of all TPN orders.

## 2020-01-02 ENCOUNTER — Inpatient Hospital Stay (HOSPITAL_COMMUNITY): Payer: Medicare Other

## 2020-01-02 ENCOUNTER — Inpatient Hospital Stay (HOSPITAL_COMMUNITY)
Admission: EM | Admit: 2020-01-02 | Discharge: 2020-01-11 | DRG: 252 | Disposition: A | Discharge status: Home Health | Payer: Medicare Other | Attending: Internal Medicine | Admitting: Internal Medicine

## 2020-01-02 ENCOUNTER — Encounter (HOSPITAL_COMMUNITY): Payer: Self-pay | Admitting: Radiology

## 2020-01-02 ENCOUNTER — Other Ambulatory Visit: Payer: Self-pay

## 2020-01-02 ENCOUNTER — Emergency Department (HOSPITAL_COMMUNITY): Payer: Medicare Other

## 2020-01-02 DIAGNOSIS — R911 Solitary pulmonary nodule: Secondary | ICD-10-CM | POA: Diagnosis present

## 2020-01-02 DIAGNOSIS — K529 Noninfective gastroenteritis and colitis, unspecified: Secondary | ICD-10-CM | POA: Diagnosis not present

## 2020-01-02 DIAGNOSIS — T80211A Bloodstream infection due to central venous catheter, initial encounter: Principal | ICD-10-CM | POA: Diagnosis present

## 2020-01-02 DIAGNOSIS — E8770 Fluid overload, unspecified: Secondary | ICD-10-CM | POA: Diagnosis present

## 2020-01-02 DIAGNOSIS — R7881 Bacteremia: Secondary | ICD-10-CM | POA: Diagnosis not present

## 2020-01-02 DIAGNOSIS — K838 Other specified diseases of biliary tract: Secondary | ICD-10-CM | POA: Diagnosis not present

## 2020-01-02 DIAGNOSIS — K8689 Other specified diseases of pancreas: Secondary | ICD-10-CM | POA: Diagnosis present

## 2020-01-02 DIAGNOSIS — Z6820 Body mass index (BMI) 20.0-20.9, adult: Secondary | ICD-10-CM | POA: Diagnosis not present

## 2020-01-02 DIAGNOSIS — R10815 Periumbilic abdominal tenderness: Secondary | ICD-10-CM | POA: Diagnosis not present

## 2020-01-02 DIAGNOSIS — K219 Gastro-esophageal reflux disease without esophagitis: Secondary | ICD-10-CM | POA: Diagnosis present

## 2020-01-02 DIAGNOSIS — K5010 Crohn's disease of large intestine without complications: Secondary | ICD-10-CM | POA: Diagnosis not present

## 2020-01-02 DIAGNOSIS — Z7951 Long term (current) use of inhaled steroids: Secondary | ICD-10-CM

## 2020-01-02 DIAGNOSIS — Z9989 Dependence on other enabling machines and devices: Secondary | ICD-10-CM | POA: Diagnosis not present

## 2020-01-02 DIAGNOSIS — R7401 Elevation of levels of liver transaminase levels: Secondary | ICD-10-CM | POA: Diagnosis present

## 2020-01-02 DIAGNOSIS — F329 Major depressive disorder, single episode, unspecified: Secondary | ICD-10-CM | POA: Diagnosis present

## 2020-01-02 DIAGNOSIS — E538 Deficiency of other specified B group vitamins: Secondary | ICD-10-CM | POA: Diagnosis present

## 2020-01-02 DIAGNOSIS — N179 Acute kidney failure, unspecified: Secondary | ICD-10-CM | POA: Diagnosis not present

## 2020-01-02 DIAGNOSIS — K5090 Crohn's disease, unspecified, without complications: Secondary | ICD-10-CM | POA: Diagnosis present

## 2020-01-02 DIAGNOSIS — Y831 Surgical operation with implant of artificial internal device as the cause of abnormal reaction of the patient, or of later complication, without mention of misadventure at the time of the procedure: Secondary | ICD-10-CM | POA: Diagnosis present

## 2020-01-02 DIAGNOSIS — N170 Acute kidney failure with tubular necrosis: Secondary | ICD-10-CM | POA: Diagnosis present

## 2020-01-02 DIAGNOSIS — Z79899 Other long term (current) drug therapy: Secondary | ICD-10-CM

## 2020-01-02 DIAGNOSIS — D638 Anemia in other chronic diseases classified elsewhere: Secondary | ICD-10-CM | POA: Diagnosis present

## 2020-01-02 DIAGNOSIS — Z95828 Presence of other vascular implants and grafts: Secondary | ICD-10-CM | POA: Diagnosis not present

## 2020-01-02 DIAGNOSIS — Z803 Family history of malignant neoplasm of breast: Secondary | ICD-10-CM

## 2020-01-02 DIAGNOSIS — Z79890 Hormone replacement therapy (postmenopausal): Secondary | ICD-10-CM

## 2020-01-02 DIAGNOSIS — N189 Chronic kidney disease, unspecified: Secondary | ICD-10-CM | POA: Diagnosis not present

## 2020-01-02 DIAGNOSIS — I871 Compression of vein: Secondary | ICD-10-CM | POA: Diagnosis present

## 2020-01-02 DIAGNOSIS — Z789 Other specified health status: Secondary | ICD-10-CM | POA: Diagnosis not present

## 2020-01-02 DIAGNOSIS — Z8249 Family history of ischemic heart disease and other diseases of the circulatory system: Secondary | ICD-10-CM

## 2020-01-02 DIAGNOSIS — G894 Chronic pain syndrome: Secondary | ICD-10-CM | POA: Diagnosis present

## 2020-01-02 DIAGNOSIS — T80219A Unspecified infection due to central venous catheter, initial encounter: Secondary | ICD-10-CM

## 2020-01-02 DIAGNOSIS — R509 Fever, unspecified: Secondary | ICD-10-CM

## 2020-01-02 DIAGNOSIS — Z832 Family history of diseases of the blood and blood-forming organs and certain disorders involving the immune mechanism: Secondary | ICD-10-CM

## 2020-01-02 DIAGNOSIS — Z20822 Contact with and (suspected) exposure to covid-19: Secondary | ICD-10-CM | POA: Diagnosis present

## 2020-01-02 DIAGNOSIS — M810 Age-related osteoporosis without current pathological fracture: Secondary | ICD-10-CM | POA: Diagnosis present

## 2020-01-02 DIAGNOSIS — E43 Unspecified severe protein-calorie malnutrition: Secondary | ICD-10-CM | POA: Diagnosis present

## 2020-01-02 DIAGNOSIS — D61818 Other pancytopenia: Secondary | ICD-10-CM | POA: Diagnosis present

## 2020-01-02 DIAGNOSIS — K50118 Crohn's disease of large intestine with other complication: Secondary | ICD-10-CM | POA: Diagnosis not present

## 2020-01-02 DIAGNOSIS — N1832 Chronic kidney disease, stage 3b: Secondary | ICD-10-CM | POA: Diagnosis present

## 2020-01-02 DIAGNOSIS — K912 Postsurgical malabsorption, not elsewhere classified: Secondary | ICD-10-CM | POA: Diagnosis not present

## 2020-01-02 DIAGNOSIS — Z800 Family history of malignant neoplasm of digestive organs: Secondary | ICD-10-CM

## 2020-01-02 DIAGNOSIS — R7989 Other specified abnormal findings of blood chemistry: Secondary | ICD-10-CM

## 2020-01-02 DIAGNOSIS — I10 Essential (primary) hypertension: Secondary | ICD-10-CM | POA: Diagnosis not present

## 2020-01-02 DIAGNOSIS — R10814 Left lower quadrant abdominal tenderness: Secondary | ICD-10-CM | POA: Diagnosis not present

## 2020-01-02 DIAGNOSIS — I129 Hypertensive chronic kidney disease with stage 1 through stage 4 chronic kidney disease, or unspecified chronic kidney disease: Secondary | ICD-10-CM | POA: Diagnosis present

## 2020-01-02 DIAGNOSIS — Z87891 Personal history of nicotine dependence: Secondary | ICD-10-CM | POA: Diagnosis not present

## 2020-01-02 DIAGNOSIS — Z8619 Personal history of other infectious and parasitic diseases: Secondary | ICD-10-CM | POA: Diagnosis not present

## 2020-01-02 DIAGNOSIS — E869 Volume depletion, unspecified: Secondary | ICD-10-CM | POA: Diagnosis present

## 2020-01-02 DIAGNOSIS — B952 Enterococcus as the cause of diseases classified elsewhere: Secondary | ICD-10-CM | POA: Diagnosis present

## 2020-01-02 DIAGNOSIS — K50819 Crohn's disease of both small and large intestine with unspecified complications: Secondary | ICD-10-CM | POA: Diagnosis present

## 2020-01-02 DIAGNOSIS — Z888 Allergy status to other drugs, medicaments and biological substances status: Secondary | ICD-10-CM | POA: Diagnosis not present

## 2020-01-02 DIAGNOSIS — Z8719 Personal history of other diseases of the digestive system: Secondary | ICD-10-CM | POA: Diagnosis not present

## 2020-01-02 DIAGNOSIS — R10816 Epigastric abdominal tenderness: Secondary | ICD-10-CM | POA: Diagnosis not present

## 2020-01-02 DIAGNOSIS — N183 Chronic kidney disease, stage 3 unspecified: Secondary | ICD-10-CM | POA: Diagnosis not present

## 2020-01-02 DIAGNOSIS — Z83511 Family history of glaucoma: Secondary | ICD-10-CM

## 2020-01-02 DIAGNOSIS — K50119 Crohn's disease of large intestine with unspecified complications: Secondary | ICD-10-CM | POA: Diagnosis not present

## 2020-01-02 DIAGNOSIS — Z9049 Acquired absence of other specified parts of digestive tract: Secondary | ICD-10-CM | POA: Diagnosis not present

## 2020-01-02 DIAGNOSIS — Z881 Allergy status to other antibiotic agents status: Secondary | ICD-10-CM | POA: Diagnosis not present

## 2020-01-02 DIAGNOSIS — R748 Abnormal levels of other serum enzymes: Secondary | ICD-10-CM | POA: Diagnosis present

## 2020-01-02 DIAGNOSIS — R1084 Generalized abdominal pain: Secondary | ICD-10-CM | POA: Diagnosis not present

## 2020-01-02 DIAGNOSIS — K50818 Crohn's disease of both small and large intestine with other complication: Secondary | ICD-10-CM | POA: Diagnosis present

## 2020-01-02 DIAGNOSIS — Z885 Allergy status to narcotic agent status: Secondary | ICD-10-CM | POA: Diagnosis not present

## 2020-01-02 DIAGNOSIS — Z833 Family history of diabetes mellitus: Secondary | ICD-10-CM

## 2020-01-02 DIAGNOSIS — B9689 Other specified bacterial agents as the cause of diseases classified elsewhere: Secondary | ICD-10-CM | POA: Diagnosis not present

## 2020-01-02 DIAGNOSIS — Z9889 Other specified postprocedural states: Secondary | ICD-10-CM | POA: Diagnosis not present

## 2020-01-02 DIAGNOSIS — Z820 Family history of epilepsy and other diseases of the nervous system: Secondary | ICD-10-CM

## 2020-01-02 LAB — URINALYSIS, ROUTINE W REFLEX MICROSCOPIC
Bacteria, UA: NONE SEEN
Bilirubin Urine: NEGATIVE
Glucose, UA: NEGATIVE mg/dL
Hgb urine dipstick: NEGATIVE
Ketones, ur: NEGATIVE mg/dL
Leukocytes,Ua: NEGATIVE
Nitrite: NEGATIVE
Protein, ur: 30 mg/dL — AB
Specific Gravity, Urine: 1.017 (ref 1.005–1.030)
pH: 6 (ref 5.0–8.0)

## 2020-01-02 LAB — CBC
HCT: 26.3 % — ABNORMAL LOW (ref 36.0–46.0)
Hemoglobin: 8.5 g/dL — ABNORMAL LOW (ref 12.0–15.0)
MCH: 30.1 pg (ref 26.0–34.0)
MCHC: 32.3 g/dL (ref 30.0–36.0)
MCV: 93.3 fL (ref 80.0–100.0)
Platelets: 191 10*3/uL (ref 150–400)
RBC: 2.82 MIL/uL — ABNORMAL LOW (ref 3.87–5.11)
RDW: 14.5 % (ref 11.5–15.5)
WBC: 8.1 10*3/uL (ref 4.0–10.5)
nRBC: 0 % (ref 0.0–0.2)

## 2020-01-02 LAB — COMPREHENSIVE METABOLIC PANEL
ALT: 403 U/L — ABNORMAL HIGH (ref 0–44)
AST: 447 U/L — ABNORMAL HIGH (ref 15–41)
Albumin: 2.7 g/dL — ABNORMAL LOW (ref 3.5–5.0)
Alkaline Phosphatase: 300 U/L — ABNORMAL HIGH (ref 38–126)
Anion gap: 11 (ref 5–15)
BUN: 46 mg/dL — ABNORMAL HIGH (ref 6–20)
CO2: 24 mmol/L (ref 22–32)
Calcium: 9 mg/dL (ref 8.9–10.3)
Chloride: 102 mmol/L (ref 98–111)
Creatinine, Ser: 1.63 mg/dL — ABNORMAL HIGH (ref 0.44–1.00)
GFR calc Af Amer: 40 mL/min — ABNORMAL LOW (ref >60)
GFR calc non Af Amer: 34 mL/min — ABNORMAL LOW (ref >60)
Glucose, Bld: 102 mg/dL — ABNORMAL HIGH (ref 70–99)
Potassium: 3.5 mmol/L (ref 3.5–5.1)
Sodium: 137 mmol/L (ref 135–145)
Total Bilirubin: 0.9 mg/dL (ref 0.3–1.2)
Total Protein: 6.3 g/dL — ABNORMAL LOW (ref 6.5–8.1)

## 2020-01-02 LAB — BLOOD CULTURE ID PANEL (REFLEXED)
Acinetobacter baumannii: NOT DETECTED
Candida albicans: NOT DETECTED
Candida glabrata: NOT DETECTED
Candida krusei: NOT DETECTED
Candida parapsilosis: NOT DETECTED
Candida tropicalis: NOT DETECTED
Carbapenem resistance: NOT DETECTED
Enterobacter cloacae complex: DETECTED — AB
Enterobacteriaceae species: DETECTED — AB
Enterococcus species: NOT DETECTED
Escherichia coli: NOT DETECTED
Haemophilus influenzae: NOT DETECTED
Klebsiella oxytoca: NOT DETECTED
Klebsiella pneumoniae: NOT DETECTED
Listeria monocytogenes: NOT DETECTED
Neisseria meningitidis: NOT DETECTED
Proteus species: NOT DETECTED
Pseudomonas aeruginosa: NOT DETECTED
Serratia marcescens: NOT DETECTED
Staphylococcus aureus (BCID): NOT DETECTED
Staphylococcus species: NOT DETECTED
Streptococcus agalactiae: NOT DETECTED
Streptococcus pneumoniae: NOT DETECTED
Streptococcus pyogenes: NOT DETECTED
Streptococcus species: NOT DETECTED

## 2020-01-02 LAB — CBC WITH DIFFERENTIAL/PLATELET
Abs Immature Granulocytes: 0.04 10*3/uL (ref 0.00–0.07)
Basophils Absolute: 0 10*3/uL (ref 0.0–0.1)
Basophils Relative: 0 %
Eosinophils Absolute: 0 10*3/uL (ref 0.0–0.5)
Eosinophils Relative: 0 %
HCT: 26 % — ABNORMAL LOW (ref 36.0–46.0)
Hemoglobin: 8.4 g/dL — ABNORMAL LOW (ref 12.0–15.0)
Immature Granulocytes: 1 %
Lymphocytes Relative: 4 %
Lymphs Abs: 0.3 10*3/uL — ABNORMAL LOW (ref 0.7–4.0)
MCH: 30.4 pg (ref 26.0–34.0)
MCHC: 32.3 g/dL (ref 30.0–36.0)
MCV: 94.2 fL (ref 80.0–100.0)
Monocytes Absolute: 0.1 10*3/uL (ref 0.1–1.0)
Monocytes Relative: 1 %
Neutro Abs: 7.6 10*3/uL (ref 1.7–7.7)
Neutrophils Relative %: 94 %
Platelets: 190 10*3/uL (ref 150–400)
RBC: 2.76 MIL/uL — ABNORMAL LOW (ref 3.87–5.11)
RDW: 14.7 % (ref 11.5–15.5)
WBC: 8.1 10*3/uL (ref 4.0–10.5)
nRBC: 0 % (ref 0.0–0.2)

## 2020-01-02 LAB — HEPATITIS PANEL, ACUTE
HCV Ab: NONREACTIVE
Hep A IgM: NONREACTIVE
Hep B C IgM: NONREACTIVE
Hepatitis B Surface Ag: NONREACTIVE

## 2020-01-02 LAB — LIPASE, BLOOD: Lipase: 16 U/L (ref 11–51)

## 2020-01-02 LAB — PROTIME-INR
INR: 1.1 (ref 0.8–1.2)
Prothrombin Time: 13.6 seconds (ref 11.4–15.2)

## 2020-01-02 LAB — NOVEL CORONAVIRUS, NAA (HOSP ORDER, SEND-OUT TO REF LAB; TAT 18-24 HRS): SARS-CoV-2, NAA: NOT DETECTED

## 2020-01-02 LAB — LACTIC ACID, PLASMA: Lactic Acid, Venous: 0.5 mmol/L (ref 0.5–1.9)

## 2020-01-02 MED ORDER — PROBIOTIC-10 PO CHEW: CHEWABLE_TABLET | Freq: Every day | ORAL | Status: DC

## 2020-01-02 MED ORDER — AMLODIPINE BESYLATE 5 MG PO TABS
5.0000 mg | ORAL_TABLET | Freq: Every day | ORAL | Status: DC
  Administered 2020-01-03 – 2020-01-11 (×9): 5 mg via ORAL
  Filled 2020-01-02 (×10): qty 1

## 2020-01-02 MED ORDER — PANTOPRAZOLE SODIUM 40 MG PO TBEC
40.0000 mg | DELAYED_RELEASE_TABLET | Freq: Every day | ORAL | Status: DC
  Administered 2020-01-03 – 2020-01-11 (×9): 40 mg via ORAL
  Filled 2020-01-02 (×10): qty 1

## 2020-01-02 MED ORDER — POTASSIUM CHLORIDE CRYS ER 20 MEQ PO TBCR
40.0000 meq | EXTENDED_RELEASE_TABLET | Freq: Every day | ORAL | Status: DC
  Administered 2020-01-03 – 2020-01-08 (×6): 40 meq via ORAL
  Filled 2020-01-02 (×6): qty 2

## 2020-01-02 MED ORDER — HYDROMORPHONE HCL 1 MG/ML IJ SOLN
1.0000 mg | INTRAMUSCULAR | Status: DC | PRN
  Administered 2020-01-02 – 2020-01-03 (×7): 2 mg via INTRAVENOUS
  Administered 2020-01-03: 1 mg via INTRAVENOUS
  Administered 2020-01-03 – 2020-01-04 (×5): 2 mg via INTRAVENOUS
  Filled 2020-01-02 (×8): qty 2
  Filled 2020-01-02: qty 1
  Filled 2020-01-02 (×4): qty 2

## 2020-01-02 MED ORDER — FAMOTIDINE 20 MG PO TABS
20.0000 mg | ORAL_TABLET | Freq: Every day | ORAL | Status: DC
  Administered 2020-01-02 – 2020-01-10 (×8): 20 mg via ORAL
  Filled 2020-01-02 (×9): qty 1

## 2020-01-02 MED ORDER — IOHEXOL 300 MG/ML  SOLN
75.0000 mL | Freq: Once | INTRAMUSCULAR | Status: AC | PRN
  Administered 2020-01-02: 75 mL via INTRAVENOUS

## 2020-01-02 MED ORDER — ONDANSETRON HCL 4 MG PO TABS: 4.0000 mg | ORAL_TABLET | Freq: Four times a day (QID) | ORAL | Status: DC | PRN

## 2020-01-02 MED ORDER — SUCRALFATE 1 GM/10ML PO SUSP
1.0000 g | Freq: Three times a day (TID) | ORAL | Status: DC
  Administered 2020-01-02 – 2020-01-11 (×14): 1 g via ORAL
  Filled 2020-01-02 (×20): qty 10

## 2020-01-02 MED ORDER — ESTRADIOL 1 MG PO TABS
2.0000 mg | ORAL_TABLET | Freq: Every day | ORAL | Status: DC
  Administered 2020-01-03 – 2020-01-11 (×9): 2 mg via ORAL
  Filled 2020-01-02 (×11): qty 2

## 2020-01-02 MED ORDER — HYDROMORPHONE HCL 2 MG PO TABS: 4.0000 mg | ORAL_TABLET | Freq: Four times a day (QID) | ORAL | Status: DC | PRN

## 2020-01-02 MED ORDER — ENOXAPARIN SODIUM 40 MG/0.4ML ~~LOC~~ SOLN
40.0000 mg | SUBCUTANEOUS | Status: DC
  Administered 2020-01-02 – 2020-01-05 (×4): 40 mg via SUBCUTANEOUS
  Filled 2020-01-02 (×5): qty 0.4

## 2020-01-02 MED ORDER — DICYCLOMINE HCL 20 MG PO TABS
20.0000 mg | ORAL_TABLET | Freq: Three times a day (TID) | ORAL | Status: DC
  Administered 2020-01-02 – 2020-01-11 (×25): 20 mg via ORAL
  Filled 2020-01-02 (×29): qty 1

## 2020-01-02 MED ORDER — PANCRELIPASE (LIP-PROT-AMYL) 36000-114000 UNITS PO CPEP
108000.0000 [IU] | ORAL_CAPSULE | Freq: Three times a day (TID) | ORAL | Status: DC
  Administered 2020-01-09 – 2020-01-11 (×8): 108000 [IU] via ORAL
  Filled 2020-01-02 (×29): qty 3

## 2020-01-02 MED ORDER — LORAZEPAM 2 MG/ML IJ SOLN
INTRAMUSCULAR | Status: AC
  Filled 2020-01-02: qty 1

## 2020-01-02 MED ORDER — TRAZODONE HCL 100 MG PO TABS
200.0000 mg | ORAL_TABLET | Freq: Every evening | ORAL | Status: DC | PRN
  Administered 2020-01-04 – 2020-01-05 (×2): 200 mg via ORAL
  Filled 2020-01-02 (×4): qty 2

## 2020-01-02 MED ORDER — LORAZEPAM 2 MG/ML IJ SOLN
1.0000 mg | Freq: Once | INTRAMUSCULAR | Status: AC | PRN
  Administered 2020-01-02: 1 mg via INTRAVENOUS

## 2020-01-02 MED ORDER — METOPROLOL TARTRATE 100 MG PO TABS
100.0000 mg | ORAL_TABLET | Freq: Two times a day (BID) | ORAL | Status: DC
  Administered 2020-01-02 – 2020-01-11 (×17): 100 mg via ORAL
  Filled 2020-01-02 (×18): qty 1

## 2020-01-02 MED ORDER — RISAQUAD PO CAPS
1.0000 | ORAL_CAPSULE | Freq: Every day | ORAL | Status: DC
  Administered 2020-01-03 – 2020-01-11 (×9): 1 via ORAL
  Filled 2020-01-02 (×10): qty 1

## 2020-01-02 MED ORDER — HYPROMELLOSE (GONIOSCOPIC) 2.5 % OP SOLN
1.0000 [drp] | Freq: Four times a day (QID) | OPHTHALMIC | Status: DC
  Administered 2020-01-02 – 2020-01-10 (×12): 1 [drp] via OPHTHALMIC
  Filled 2020-01-02 (×3): qty 15

## 2020-01-02 MED ORDER — HYDROMORPHONE HCL 1 MG/ML IJ SOLN
1.0000 mg | Freq: Once | INTRAMUSCULAR | Status: AC
  Administered 2020-01-02: 1 mg via INTRAVENOUS
  Filled 2020-01-02: qty 1

## 2020-01-02 MED ORDER — CHLORHEXIDINE GLUCONATE CLOTH 2 % EX PADS
6.0000 | MEDICATED_PAD | Freq: Every day | CUTANEOUS | Status: DC
  Administered 2020-01-03 – 2020-01-11 (×9): 6 via TOPICAL

## 2020-01-02 MED ORDER — SODIUM CHLORIDE 0.9 % IV SOLN
2.0000 g | Freq: Three times a day (TID) | INTRAVENOUS | Status: DC
  Filled 2020-01-02 (×2): qty 2

## 2020-01-02 MED ORDER — ACETAMINOPHEN 325 MG PO TABS
650.0000 mg | ORAL_TABLET | Freq: Four times a day (QID) | ORAL | Status: DC | PRN
  Administered 2020-01-03 – 2020-01-09 (×5): 650 mg via ORAL
  Filled 2020-01-02 (×6): qty 2

## 2020-01-02 MED ORDER — ONDANSETRON HCL 4 MG/2ML IJ SOLN
4.0000 mg | Freq: Four times a day (QID) | INTRAMUSCULAR | Status: DC | PRN
  Administered 2020-01-02 – 2020-01-11 (×26): 4 mg via INTRAVENOUS
  Filled 2020-01-02 (×27): qty 2

## 2020-01-02 MED ORDER — BUDESONIDE 3 MG PO CPEP
9.0000 mg | ORAL_CAPSULE | Freq: Every day | ORAL | Status: DC
  Administered 2020-01-03 – 2020-01-11 (×8): 9 mg via ORAL
  Filled 2020-01-02 (×11): qty 3

## 2020-01-02 MED ORDER — MORPHINE SULFATE (PF) 4 MG/ML IV SOLN
4.0000 mg | Freq: Once | INTRAVENOUS | Status: AC
  Administered 2020-01-02: 4 mg via INTRAVENOUS
  Filled 2020-01-02: qty 1

## 2020-01-02 MED ORDER — PROMETHAZINE HCL 25 MG/ML IJ SOLN
25.0000 mg | Freq: Four times a day (QID) | INTRAMUSCULAR | Status: DC | PRN
  Administered 2020-01-02 – 2020-01-11 (×28): 25 mg via INTRAVENOUS
  Filled 2020-01-02 (×29): qty 1

## 2020-01-02 MED ORDER — ONDANSETRON HCL 4 MG/2ML IJ SOLN
4.0000 mg | Freq: Once | INTRAMUSCULAR | Status: AC
  Administered 2020-01-02: 4 mg via INTRAVENOUS
  Filled 2020-01-02: qty 2

## 2020-01-02 MED ORDER — HYDROMORPHONE HCL 1 MG/ML IJ SOLN
0.5000 mg | Freq: Once | INTRAMUSCULAR | Status: AC
  Administered 2020-01-02: 0.5 mg via INTRAVENOUS
  Filled 2020-01-02: qty 1

## 2020-01-02 MED ORDER — SODIUM CHLORIDE 0.9 % IV SOLN: INTRAVENOUS | Status: AC

## 2020-01-02 MED ORDER — CYCLOSPORINE 0.05 % OP EMUL
1.0000 [drp] | Freq: Two times a day (BID) | OPHTHALMIC | Status: DC
  Administered 2020-01-02 – 2020-01-11 (×18): 1 [drp] via OPHTHALMIC
  Filled 2020-01-02 (×20): qty 1

## 2020-01-02 MED ORDER — SODIUM CHLORIDE 0.9% FLUSH
3.0000 mL | Freq: Two times a day (BID) | INTRAVENOUS | Status: DC
  Administered 2020-01-02 – 2020-01-10 (×6): 3 mL via INTRAVENOUS

## 2020-01-02 MED ORDER — DEXTRAN 70-HYPROMELLOSE 0.1-0.3 % OP SOLN: 1.0000 [drp] | Freq: Four times a day (QID) | OPHTHALMIC | Status: DC

## 2020-01-02 MED ORDER — DIPHENOXYLATE-ATROPINE 2.5-0.025 MG PO TABS
1.0000 | ORAL_TABLET | Freq: Four times a day (QID) | ORAL | Status: DC | PRN
  Administered 2020-01-07: 1 via ORAL
  Filled 2020-01-02: qty 1

## 2020-01-02 MED ORDER — SODIUM CHLORIDE 0.9% FLUSH: 10.0000 mL | INTRAVENOUS | Status: DC | PRN

## 2020-01-02 MED ORDER — ALBUTEROL SULFATE (2.5 MG/3ML) 0.083% IN NEBU: 2.5000 mg | INHALATION_SOLUTION | Freq: Four times a day (QID) | RESPIRATORY_TRACT | Status: DC | PRN

## 2020-01-02 MED ORDER — PIPERACILLIN-TAZOBACTAM 3.375 G IVPB
3.3750 g | Freq: Three times a day (TID) | INTRAVENOUS | Status: DC
  Administered 2020-01-02 – 2020-01-04 (×5): 3.375 g via INTRAVENOUS
  Filled 2020-01-02 (×5): qty 50

## 2020-01-02 MED ORDER — SODIUM CHLORIDE 0.9 % IV BOLUS
1000.0000 mL | Freq: Once | INTRAVENOUS | Status: AC
  Administered 2020-01-02: 1000 mL via INTRAVENOUS

## 2020-01-02 MED ORDER — ACETAMINOPHEN 650 MG RE SUPP: 650.0000 mg | Freq: Four times a day (QID) | RECTAL | Status: DC | PRN

## 2020-01-02 MED ORDER — DULOXETINE HCL 60 MG PO CPEP
90.0000 mg | ORAL_CAPSULE | Freq: Every day | ORAL | Status: DC
  Administered 2020-01-03 – 2020-01-11 (×9): 90 mg via ORAL
  Filled 2020-01-02 (×9): qty 1

## 2020-01-02 MED ORDER — BUPROPION HCL ER (SR) 100 MG PO TB12
200.0000 mg | ORAL_TABLET | Freq: Every day | ORAL | Status: DC
  Administered 2020-01-03 – 2020-01-11 (×9): 200 mg via ORAL
  Filled 2020-01-02 (×9): qty 2

## 2020-01-02 NOTE — ED Notes (Signed)
Report called to [facility]

## 2020-01-02 NOTE — Progress Notes (Signed)
Received consult for TPN.  Will order labs and initiate tomorrow evening as ordered after today's cutoff  Barth Kirks, PharmD, BCPS, BCCCP Clinical Pharmacist [phone]  Please check AMION for all [place] numbers  01/02/2020 2:14 PM

## 2020-01-02 NOTE — H&P (Addendum)
History and Physical    RAENETTE SAKATA RFF:[id number] DOB: Apr 24, 1961 DOA: 01/02/2020  Referring MD/NP/PA: Coral Ceo, PA-C PCP: [name], [name], MD  Patient coming from: Home  Chief Complaint: Abdominal pain and diarrhea.  I have personally briefly reviewed patient's old medical records in [place]   HPI: Caitlin Peters is a 59 y.o. female with medical history significant of hypertension, Crohn's disease s/p short gut syndrome, SBO, on TPN, chronic anemia requiring blood transfusions, chronic pain syndrome, depression, and osteoporosis, and GERD.  She presents with complaints of abdominal pain and diarrhea over the last few days.  Pain is severe located in the lower part of her abdomen with radiation to her back.  She has been having anywhere from 7-8 bowel movements per day with some incontinence.  Stools have been green in color and denies any signs of blood.  Associated symptoms include complaints of fever, nausea, dry heaves, no appetite, and generalized malaise.  Tried utilizing Tylenol for fever and her home pain medications without improvement in symptoms.  Her left femoral catheter was changed 2 days ago and she continues to be on TPN.   Denies any redness or erythema around her catheter line.  Currently being followed by Dr. Tarri Glenn of [facility] and Dr. Domenica Fail at [facility].  Since moving from [place] in November of last year this is the patient's fourth admission into the hospital.  Denies having any dysuria, chest pain, shortness of breath, or cough or any recent falls since December.  ED Course: Upon admission to the emergency department patient was noted to be febrile up to 101, but all other vital signs relatively within normal limits.  Labs significant for WBC 8.1, hemoglobin 8.4, BUN 46, creatinine 1.63, alkaline phosphatase 300, AST 447, ALT 403, lipase 16, and total bilirubin 0.9.  CT scan of the abdomen and pelvis revealed unchanged mildly dilated loops of  bowel and gas left abdomen pelvis with mild circumferential wall thickening possibly representing colitis, mild splenomegaly, and a right 6 mm lower lobe nodule [facility] had been formally consulted recommended checking several labs.  Patient had been given 1 L normal saline IV fluids, Zofran, morphine, and Dilaudid.  COVID-19 screening pending.  Review of Systems  Constitutional: Positive for fever and malaise/fatigue.  HENT: Negative for congestion and nosebleeds.   Eyes: Negative for photophobia and discharge.  Respiratory: Negative for cough and shortness of breath.   Cardiovascular: Negative for chest pain and leg swelling.  Gastrointestinal: Positive for abdominal pain, diarrhea and nausea. Negative for blood in stool.  Genitourinary: Negative for dysuria.  Musculoskeletal: Positive for back pain. Negative for falls.  Skin: Negative for itching.  Neurological: Positive for weakness. Negative for focal weakness.  Psychiatric/Behavioral: Negative for memory loss. The patient is nervous/anxious.     Past Medical History:  Diagnosis Date  . Anasarca 10/2019  . AVN (avascular necrosis of bone) ([place])   . Cataract   . Chronic pain syndrome   . CKD (chronic kidney disease), stage III   . Crohn disease ([place])   . Crohn disease ([place])   . Depression   . Diverticulosis   . GERD (gastroesophageal reflux disease)   . HTN (hypertension)   . IDA (iron deficiency anemia)   . Malnutrition ([place])   . Mass in chest   . Osteoporosis   . Pancreatitis   . Short gut syndrome   . Vitamin B12 deficiency     Past Surgical History:  Procedure Laterality  Date  . ABDOMINAL HYSTERECTOMY    . ABDOMINAL SURGERY     colon resection with abdominal stoma, repair of intestinal leak, reversal of abdominal stoma  . BILIARY DILATION  11/26/2019   Procedure: BILIARY DILATION;  Surgeon: Jackquline Denmark, MD;  Location: [facility];  Service: Endoscopy;;  . CHEST WALL RESECTION     right thoracotomy,resection  of chest mass with anterior rib and reconstruction using prosthetic mesh and video arthroscopy  . CHOLECYSTECTOMY    . COLONOSCOPY  2019  . ERCP N/A 11/26/2019   Procedure: ENDOSCOPIC RETROGRADE CHOLANGIOPANCREATOGRAPHY (ERCP);  Surgeon: Jackquline Denmark, MD;  Location: [facility];  Service: Endoscopy;  Laterality: N/A;  . IR US GUIDE VASC ACCESS LEFT     x 2 06/17/19 and 09/14/2019  . KNEE SURGERY     right knee   . REMOVAL OF STONES  11/26/2019   Procedure: REMOVAL OF STONES;  Surgeon: Jackquline Denmark, MD;  Location: [facility];  Service: Endoscopy;;  . SMALL INTESTINE SURGERY     x3  . SPHINCTEROTOMY  11/26/2019   Procedure: SPHINCTEROTOMY;  Surgeon: Jackquline Denmark, MD;  Location: [facility];  Service: Endoscopy;;  . UPPER GASTROINTESTINAL ENDOSCOPY       reports that she has quit smoking. She has never used smokeless tobacco. She reports previous alcohol use. She reports that she does not use drugs.  Allergies  Allergen Reactions  . Cefepime Other (See Comments)    Neurotoxicity occurring in setting of AKI. Ceftriaxone tolerated during same admit  . Gabapentin Other (See Comments)    unknown  . Hyoscyamine Hives and Swelling    Legs swelling   Disorientation  . Lyrica [Pregabalin] Other (See Comments)    unknown  . Meperidine Hives    Other reaction(s): GI Upset Due to [place]   . Topamax [Topiramate] Other (See Comments)    unknown  . Fentanyl Rash  . Morphine And Related Rash    Family History  Problem Relation Age of Onset  . Breast cancer Sister   . Multiple sclerosis Sister   . Diabetes Sister   . Lupus Sister   . Colon cancer Other   . Crohn's disease Other   . Seizures Mother   . Glaucoma Mother   . CAD Father   . Heart disease Father   . Hypertension Father     Prior to Admission medications   Medication Sig Start Date End Date Taking? Authorizing Provider  acetaminophen (TYLENOL) 325 MG tablet Take 650 mg by mouth every 6 (six) hours as needed  for mild pain.    Yes [provider]  amLODipine (NORVASC) 5 MG tablet Take 1 tablet (5 mg total) by mouth daily. 10/16/19  Yes [name], [name], MD  budesonide (ENTOCORT EC) 3 MG 24 hr capsule Take 3 capsules (9 mg total) by mouth daily. 12/15/19 03/14/20 Yes Erline Hau, MD  buPROPion [facility] SR) 100 MG 12 hr tablet Take 2 tablets (200 mg total) by mouth daily. 12/15/19 03/14/20 Yes Erline Hau, MD  calcium carbonate (OS-CAL) 1250 (500 Ca) MG chewable tablet Chew 1 tablet by mouth 2 (two) times daily.   Yes [provider]  cholecalciferol (VITAMIN D3) 25 MCG (1000 UT) tablet Take 2,000 Units by mouth daily.   Yes [provider]  cycloSPORINE (RESTASIS) 0.05 % ophthalmic emulsion Place 1 drop into both eyes 2 (two) times daily.   Yes [provider]  dexlansoprazole (DEXILANT) 60 MG capsule Take 1 capsule (  60 mg total) by mouth daily. 10/16/19  Yes [name], [name], MD  Dextran 70-Hypromellose 0.1-0.3 % SOLN Place 1 drop into both eyes 4 (four) times daily.   Yes [provider]  dicyclomine (BENTYL) 20 MG tablet Take 20 mg by mouth 3 (three) times daily. 10/26/19  Yes [provider]  diphenoxylate-atropine (LOMOTIL) 2.5-0.025 MG tablet Take 1 tablet by mouth 4 (four) times daily as needed for diarrhea or loose stools. 10/31/19  Yes Dessa Phi, DO  DULoxetine (CYMBALTA) 30 MG capsule Take 3 capsules (90 mg total) by mouth daily. 11/01/19  Yes Dessa Phi, DO  estradiol (ESTRACE) 2 MG tablet Take 1 tablet (2 mg total) by mouth daily. 12/10/19  Yes [name], [name], MD  famotidine (PEPCID) 20 MG tablet Take 20 mg by mouth at bedtime.   Yes [provider]  HYDROmorphone (DILAUDID) 4 MG tablet Take 1 tablet (4 mg total) by mouth every 6 (six) hours as needed for severe pain. 12/06/19  Yes Ripley Fraise, MD  lipase/protease/amylase (CREON) 36000 UNITS CPEP capsule Take 3  capsules (108,000 Units total) by mouth 3 (three) times daily before meals. 12/15/19 03/14/20 Yes Erline Hau, MD  loperamide (IMODIUM) 2 MG capsule Take 1 capsule (2 mg total) by mouth as needed for diarrhea or loose stools. 12/31/19  Yes [name], [name], MD  metoprolol tartrate (LOPRESSOR) 100 MG tablet Take 100 mg by mouth 2 (two) times daily.   Yes [provider]  Multiple Vitamins-Minerals (MULTIVITAMIN ADULT PO) Take 1 tablet by mouth daily.   Yes [provider]  Omega-3 1000 MG CAPS Take 1 capsule by mouth daily.   Yes [provider]  potassium chloride SA (KLOR-CON) 20 MEQ tablet Take 2 tablets (40 mEq total) by mouth daily. 10/16/19  Yes [name], [name], MD  Probiotic Product (PROBIOTIC-10 PO) Take 1 capsule by mouth daily.   Yes [provider]  promethazine (PHENERGAN) 25 MG tablet TAKE 1 TABLET BY MOUTH EVERY 6 HOURS AS NEEDED FOR NAUSEA FOR VOMITING Patient taking differently: Take 25 mg by mouth every 6 (six) hours as needed for nausea or vomiting.  12/23/19  Yes [name], [name], MD  sucralfate (CARAFATE) 1 GM/10ML suspension Take 10 mLs (1 g total) by mouth 4 (four) times daily -  with meals and at bedtime. 12/15/19  Yes [name], [name], MD  traZODone (DESYREL) 100 MG tablet Take 2 tablets (200 mg total) by mouth at bedtime as needed for sleep. 12/31/19  Yes [name], [name], MD  vitamin B-12 (CYANOCOBALAMIN) 100 MCG tablet Take 1,000 mcg by mouth daily.    Yes [provider]  methadone (DOLOPHINE) 10 MG tablet Take 2 tablets (20 mg total) by mouth every 12 (twelve) hours. Patient not taking: Reported on 12/15/2019 11/30/19   Shelly Coss, MD  potassium chloride (KLOR-CON) 10 MEQ tablet Take 2 tablets (20 mEq total) by mouth daily for 2 days. Patient not taking: Reported on 01/02/2020 12/08/19 01/01/29  Deliah Boston, PA-C  PRESCRIPTION MEDICATION Home TPN [facility] [place]    [provider]    Physical Exam:  Constitutional: Middle-aged female who appears to be Vitals:   01/02/20 0645 01/02/20 0730 01/02/20 0945 01/02/20 1015  BP: (!) 147/75 (!) 151/65 (!) 156/76 (!) 144/64  Pulse: 79 76  87  Resp: 18 18  17   Temp:      TempSrc:  SpO2: 100% 100% 99% 100%   Eyes: PERRL, lids and conjunctivae normal ENMT: Mucous membranes are dry. Posterior pharynx clear of any exudate or lesions.  Neck: normal, supple, no masses, no thyromegaly Respiratory: clear to auscultation bilaterally, no wheezing, no crackles. Normal respiratory effort. No accessory muscle use.  Cardiovascular: Regular rate and rhythm, no murmurs / rubs / gallops. No extremity edema. 2+ pedal pulses. No carotid bruits.  Abdomen: Mildly distended with tenderness to palpation of the left lower quadrant.  No guarding or peritonitis signs appreciated.   Musculoskeletal: no clubbing / cyanosis. No joint deformity upper and lower extremities. Good ROM, no contractures. Normal muscle tone.  Skin: no rashes, lesions, ulcers. No induration Neurologic: CN 2-12 grossly intact. Sensation intact, DTR normal. Strength 5/5 in all 4.  Psychiatric: Normal judgment and insight. Alert and oriented x 3. Normal mood.     Labs on Admission: I have personally reviewed following labs and imaging studies  CBC: Recent Labs  Lab 01/02/20 0231  WBC 8.1  8.1  NEUTROABS 7.6  HGB 8.4*  8.5*  HCT 26.0*  26.3*  MCV 94.2  93.3  PLT 190  517   Basic Metabolic Panel: Recent Labs  Lab 01/02/20 0231  NA 137  K 3.5  CL 102  CO2 24  GLUCOSE 102*  BUN 46*  CREATININE 1.63*  CALCIUM 9.0   GFR: CrCl cannot be calculated (Unknown ideal weight.). Liver Function Tests: Recent Labs  Lab 01/02/20 0231  AST 447*  ALT 403*  ALKPHOS 300*  BILITOT 0.9  PROT 6.3*  ALBUMIN 2.7*   Recent Labs  Lab 01/02/20 0231  LIPASE 16   No results for input(s): AMMONIA in the last 168  hours. Coagulation Profile: No results for input(s): INR, PROTIME in the last 168 hours. Cardiac Enzymes: No results for input(s): CKTOTAL, CKMB, CKMBINDEX, TROPONINI in the last 168 hours. BNP (last 3 results) No results for input(s): PROBNP in the last 8760 hours. HbA1C: No results for input(s): HGBA1C in the last 72 hours. CBG: No results for input(s): GLUCAP in the last 168 hours. Lipid Profile: No results for input(s): CHOL, HDL, LDLCALC, TRIG, CHOLHDL, LDLDIRECT in the last 72 hours. Thyroid Function Tests: No results for input(s): TSH, T4TOTAL, FREET4, T3FREE, THYROIDAB in the last 72 hours. Anemia Panel: No results for input(s): VITAMINB12, FOLATE, FERRITIN, TIBC, IRON, RETICCTPCT in the last 72 hours. Urine analysis:    Component Value Date/Time   COLORURINE YELLOW 01/02/2020 0220   APPEARANCEUR CLEAR 01/02/2020 0220   LABSPEC 1.017 01/02/2020 0220   PHURINE 6.0 01/02/2020 0220   GLUCOSEU NEGATIVE 01/02/2020 0220   HGBUR NEGATIVE 01/02/2020 0220   BILIRUBINUR NEGATIVE 01/02/2020 0220   KETONESUR NEGATIVE 01/02/2020 0220   PROTEINUR 30 (A) 01/02/2020 0220   NITRITE NEGATIVE 01/02/2020 0220   LEUKOCYTESUR NEGATIVE 01/02/2020 0220   Sepsis Labs: Recent Results (from the past 240 hour(s))  Blood culture (routine x 2)     Status: None (Preliminary result)   Collection Time: 01/02/20  5:30 AM   Specimen: BLOOD  Result Value Ref Range Status   Specimen Description BLOOD RIGHT ANTECUBITAL  Final   Special Requests   Final    BOTTLES DRAWN AEROBIC AND ANAEROBIC Blood Culture results may not be optimal due to an inadequate volume of blood received in culture bottles   Culture PENDING  Incomplete   Report Status PENDING  Incomplete     Radiological Exams on Admission: CT ABDOMEN PELVIS W CONTRAST  Result Date: 01/02/2020  CLINICAL DATA:  59 year old female with acute abdominal and pelvic pain with fever. History of Crohn's disease and chronic kidney disease. EXAM: CT  ABDOMEN AND PELVIS WITH CONTRAST TECHNIQUE: Multidetector CT imaging of the abdomen and pelvis was performed using the standard protocol following bolus administration of intravenous contrast. CONTRAST:  31m OMNIPAQUE IOHEXOL 300 MG/ML  SOLN COMPARISON:  12/08/2019 and prior CTs FINDINGS: Lower chest: A 6 mm RIGHT LOWER lobe nodule (series 4: Image 4) is noted. It is difficult to determine if this area was imaged on prior abdomen/pelvic CTs. Hepatobiliary: The liver is unremarkable. The patient is status post cholecystectomy. Mild CBD and intrahepatic biliary dilatation again noted. Pancreas: Mild pancreatic ductal dilatation is unchanged. No focal hepatic abnormalities noted. Spleen: Mild splenomegaly appears increased. Adrenals/Urinary Tract: The kidneys, adrenal glands and bladder are unremarkable. Stomach/Bowel: Evidence of partial colectomy with ileocolonic anastomosis again noted. Unchanged mildly distended fluid and gas-filled colon within the LEFT abdomen and pelvis with equivocal mild circumferential wall thickening. No dilated small bowel loops are noted. Little significant changes noted since the prior study. Vascular/Lymphatic: A LEFT femoral catheter is noted. Mild aortic atherosclerotic calcification noted without aneurysm. No definite enlarged nodes identified. Reproductive: Status post hysterectomy. No adnexal masses. Other: No ascites, abscess or pneumoperitoneum. Musculoskeletal: No acute or suspicious bony abnormality. IMPRESSION: 1. Unchanged mildly distended loops of fluid and gas filled colon within the LEFT abdomen and pelvis with equivocal mild circumferential wall thickening. This may represent a colitis. No evidence of small bowel obstruction or pneumoperitoneum. 2. Mild splenomegaly, apparent slightly increased from the prior study. 3. 6 mm RIGHT LOWER lobe nodule. Non-contrast chest CT at 6-12 months is recommended. If the nodule is stable at time of repeat CT, then future CT at 18-24  months (from today's scan) is considered optional for low-risk patients, but is recommended for high-risk patients. This recommendation follows the consensus statement: Guidelines for Management of Incidental Pulmonary Nodules Detected on CT Images: From the Fleischner Society 2017; Radiology 2017; 284:228-243. 4. Aortic Atherosclerosis (ICD10-I70.0). Electronically Signed   By: JMargarette CanadaM.D.   On: 01/02/2020 09:14    Assessment/Plan Suspected Crohn's colitis, short gut syndrome, chronic diarrhea: Acute.  Patient presents with complaints of severe lower abdominal pain with radiation into her back and diarrhea.  CT imaging consistent with possible Crohn's colitis.  Gastroenterology consulted and we will see the patient and give additional recommendations. -Admit to a MedSurg bed -N.p.o. except meds -Follow-up C. Difficile and GI panel -Continue budesonide  -IV Dilaudid for pain control antiemetics -Appreciate GI consultative services  Acute kidney injury superimposed on chronic kidney disease 3B: Patient presents with creatinine elevated up to 1.63 with BUN 46.  Previously creatinine had been 1.21 on 1/18 elevated BUN to creatinine ratio suggest a prerenal cause of symptoms. -Normal saline IV fluids at 100 mL/h -Check kidney function in a.m.  Elevated liver enzymes: Acute.  Patient found to have alkaline phosphatase 300, AST 447, and ALT 403 with total bilirubin within normal limits.  Review of records notes that patient has had previous elevations in liver enzymes during previous hospitalizations that resolved with IV fluids. Question the possibility of TPN induced hepatitis versus ascending cholangitis given fever.  -Follow-up antismooth muscle antibody, Epstein-Barr virus IgM, CMV IgG, hepatitis panel, ANA, and IgG -Recheck CMP in a.m. -MRI/MRCP per GI  Fever: Patient presented and was found to have fever up to 101 F, but no other signs of sepsis appreciated.  Suspect secondary to colitis.   Hospitalization  from 11/25/2019 noted Enterobacter cloacae that was resistant to cefazolin.  -Follow-up blood cultures  TPN, with protein calorie malnutrition  -Continue TPN per pharmacy  Essential hypertension: Home medications include amlodipine 5 mg daily and metoprolol 100 mg twice daily. -Continue home regimen  Chronic pain syndrome: Patient on Dilaudid 4 mg every 4 hours as needed for pain at home. -Switch to IV Dilaudid during acute period  Anemia of chronic disease: Hemoglobin 8.4 which appears near patient's baseline upon review of records. -Continue to monitor  GERD: Patient normally takes Carafate 1 g 3 times daily with meals, Dexilant 60 mg every morning and 20 mg of Pepcid at night. -Continue current home regimen  Pulmonary nodule: Noted to be 6 mm noted in the right lower lobe.  Patient reports that she chronically gets these nodules that spontaneously resolved -Recommend repeat follow-up CT outpatient setting 6 months  Depression -Continue Wellbutrin and trazodone  DVT prophylaxis: lovenox  Code Status: Full Family Communication: No family present at bedside Disposition Plan: Possible discharge home in 2 to 3 days Consults called: GI Admission status: Inpatient  Norval Morton MD [facility] Pager [phone]   If 7PM-7AM, please contact night-coverage [email or web address] Password [email or web address]  01/02/2020, 11:21 AM

## 2020-01-02 NOTE — Progress Notes (Signed)
PHARMACY - PHYSICIAN COMMUNICATION CRITICAL VALUE ALERT - BLOOD CULTURE IDENTIFICATION (BCID)  Caitlin Peters is an 59 y.o. female who presented to [facility] on 01/02/2020 with a chief complaint of Crohn's flare  Assessment: BCID result with Enterobacter cloacae complex no kpc detected. Recently had same bacteria grow in  11/25/19 BCx. Clinically stable: WBC wnl, LA wnl, fever 101 on admit now afebrile, but with 2/2 blood cultures positive. Has femoral PICC line changed 2 days PTA and is on TPN. Suggest starting cefepime for bacteremia.    Name of physician (or Provider) ContactedKennon Holter NP  Current antibiotics: none  Changes to prescribed antibiotics recommended:  Recommendations accepted by provider  Results for orders placed or performed during the hospital encounter of 01/02/20  Blood Culture ID Panel (Reflexed) (Collected: 01/02/2020  5:30 AM)  Result Value Ref Range   Enterococcus species NOT DETECTED NOT DETECTED   Listeria monocytogenes NOT DETECTED NOT DETECTED   Staphylococcus species NOT DETECTED NOT DETECTED   Staphylococcus aureus (BCID) NOT DETECTED NOT DETECTED   Streptococcus species NOT DETECTED NOT DETECTED   Streptococcus agalactiae NOT DETECTED NOT DETECTED   Streptococcus pneumoniae NOT DETECTED NOT DETECTED   Streptococcus pyogenes NOT DETECTED NOT DETECTED   Acinetobacter baumannii NOT DETECTED NOT DETECTED   Enterobacteriaceae species DETECTED (A) NOT DETECTED   Enterobacter cloacae complex DETECTED (A) NOT DETECTED   Escherichia coli NOT DETECTED NOT DETECTED   Klebsiella oxytoca NOT DETECTED NOT DETECTED   Klebsiella pneumoniae NOT DETECTED NOT DETECTED   Proteus species NOT DETECTED NOT DETECTED   Serratia marcescens NOT DETECTED NOT DETECTED   Carbapenem resistance NOT DETECTED NOT DETECTED   Haemophilus influenzae NOT DETECTED NOT DETECTED   Neisseria meningitidis NOT DETECTED NOT DETECTED   Pseudomonas aeruginosa NOT DETECTED NOT DETECTED   Candida  albicans NOT DETECTED NOT DETECTED   Candida glabrata NOT DETECTED NOT DETECTED   Candida krusei NOT DETECTED NOT DETECTED   Candida parapsilosis NOT DETECTED NOT DETECTED   Candida tropicalis NOT DETECTED NOT DETECTED   Benetta Spar, PharmD, BCPS, BCCP Clinical Pharmacist  Please check AMION for all [place] phone numbers After 10:00 PM, call [place] [phone]

## 2020-01-02 NOTE — ED Provider Notes (Signed)
Care assumed from [facility] [place], PA-C at shift change. See her note for full H&P.  Per her note, "Caitlin Peters is a 59 y.o. female with a history of short gut syndrome, Crohn disease, GERD, HTN who presents to the emergency department with a chief complaint of abdominal pain and fever.  The patient endorses fever accompanied by chills, bilateral lower abdominal pain, and frequent, forceful "green" diarrhea that began over the last few days.  She reports that the symptoms are similar to Crohn flares that she has had in the past.  Reports that she has been trying to manage her fever at home with Tylenol, but her pain and symptoms have been continuing to progress since onset.  She reports that she was having nausea and dry heaving over the last few days, but the symptoms have since resolved.  She denies dysuria, hematuria, vaginal pain or discharge, chest pain, shortness of breath, back pain, vomiting, rash, melena, hematochezia, rectal pain.  The patient was recently established with a Crohn's specialist, Dr. Domenica Fail at [facility].  She is also followed by Dr. Tarri Glenn with gastroenterology at [facility].  She currently takes 9 mg of budesonide daily for Crohn's.  She has a history of 7-8 previous surgeries, small bowel obstructions on multiple occasions, and has been on chronic TPN since 2003.  She also has a history of chronic pain and takes methadone and oral Dilaudid.   She was most recently admitted from 12/30 to 01/04 with complaints of abdominal pain, nausea, vomiting, diarrhea.  She was found to have pancolitis and worsening LFTs.  She was also found to have ascending cholangitis with Enterobacter bacteremia.  She underwent ERCP with sphincterotomy and improved.  She was discharged home with oral Levaquin after receiving IV Levaquin during her admission.  She has a left femoral PICC line in place.  She has been compliant with her home TPN."  GI: Dr. Karmen Bongo Recent referral to [facility] Crohn's  specialist: Dr. Domenica Fail  Physical Exam  BP (!) 144/64   Pulse 87   Temp 98.3 F (36.8 C) (Oral)   Resp 17   SpO2 100%   Physical Exam Vitals and nursing note reviewed.  Constitutional:      General: She is not in acute distress.    Appearance: She is well-developed.     Comments: Sitting up in bed  HENT:     Head: Normocephalic and atraumatic.  Eyes:     Conjunctiva/sclera: Conjunctivae normal.  Cardiovascular:     Rate and Rhythm: Normal rate.  Pulmonary:     Effort: Pulmonary effort is normal.  Musculoskeletal:        General: Normal range of motion.     Cervical back: Neck supple.  Skin:    General: Skin is warm and dry.  Neurological:     Mental Status: She is alert.     Comments: Clear speech, moving all extremities     ED Course/Procedures     Procedures Results for orders placed or performed during the hospital encounter of 01/02/20  Blood culture (routine x 2)   Specimen: BLOOD  Result Value Ref Range   Specimen Description BLOOD RIGHT ANTECUBITAL    Special Requests      BOTTLES DRAWN AEROBIC AND ANAEROBIC Blood Culture results may not be optimal due to an inadequate volume of blood received in culture bottles   Culture PENDING    Report Status PENDING   Lipase, blood  Result Value Ref Range   Lipase 16  11 - 51 U/L  Comprehensive metabolic panel  Result Value Ref Range   Sodium 137 135 - 145 mmol/L   Potassium 3.5 3.5 - 5.1 mmol/L   Chloride 102 98 - 111 mmol/L   CO2 24 22 - 32 mmol/L   Glucose, Bld 102 (H) 70 - 99 mg/dL   BUN 46 (H) 6 - 20 mg/dL   Creatinine, Ser 1.63 (H) 0.44 - 1.00 mg/dL   Calcium 9.0 8.9 - 10.3 mg/dL   Total Protein 6.3 (L) 6.5 - 8.1 g/dL   Albumin 2.7 (L) 3.5 - 5.0 g/dL   AST 447 (H) 15 - 41 U/L   ALT 403 (H) 0 - 44 U/L   Alkaline Phosphatase 300 (H) 38 - 126 U/L   Total Bilirubin 0.9 0.3 - 1.2 mg/dL   GFR calc non Af Amer 34 (L) >60 mL/min   GFR calc Af Amer 40 (L) >60 mL/min   Anion gap 11 5 - 15  CBC  Result Value  Ref Range   WBC 8.1 4.0 - 10.5 K/uL   RBC 2.82 (L) 3.87 - 5.11 MIL/uL   Hemoglobin 8.5 (L) 12.0 - 15.0 g/dL   HCT 26.3 (L) 36.0 - 46.0 %   MCV 93.3 80.0 - 100.0 fL   MCH 30.1 26.0 - 34.0 pg   MCHC 32.3 30.0 - 36.0 g/dL   RDW 14.5 11.5 - 15.5 %   Platelets 191 150 - 400 K/uL   nRBC 0.0 0.0 - 0.2 %  Urinalysis, Routine w reflex microscopic  Result Value Ref Range   Color, Urine YELLOW YELLOW   APPearance CLEAR CLEAR   Specific Gravity, Urine 1.017 1.005 - 1.030   pH 6.0 5.0 - 8.0   Glucose, UA NEGATIVE NEGATIVE mg/dL   Hgb urine dipstick NEGATIVE NEGATIVE   Bilirubin Urine NEGATIVE NEGATIVE   Ketones, ur NEGATIVE NEGATIVE mg/dL   Protein, ur 30 (A) NEGATIVE mg/dL   Nitrite NEGATIVE NEGATIVE   Leukocytes,Ua NEGATIVE NEGATIVE   RBC / HPF 0-5 0 - 5 RBC/hpf   WBC, UA 0-5 0 - 5 WBC/hpf   Bacteria, UA NONE SEEN NONE SEEN   Squamous Epithelial / LPF 0-5 0 - 5  CBC with Differential/Platelet  Result Value Ref Range   WBC 8.1 4.0 - 10.5 K/uL   RBC 2.76 (L) 3.87 - 5.11 MIL/uL   Hemoglobin 8.4 (L) 12.0 - 15.0 g/dL   HCT 26.0 (L) 36.0 - 46.0 %   MCV 94.2 80.0 - 100.0 fL   MCH 30.4 26.0 - 34.0 pg   MCHC 32.3 30.0 - 36.0 g/dL   RDW 14.7 11.5 - 15.5 %   Platelets 190 150 - 400 K/uL   nRBC 0.0 0.0 - 0.2 %   Neutrophils Relative % 94 %   Neutro Abs 7.6 1.7 - 7.7 K/uL   Lymphocytes Relative 4 %   Lymphs Abs 0.3 (L) 0.7 - 4.0 K/uL   Monocytes Relative 1 %   Monocytes Absolute 0.1 0.1 - 1.0 K/uL   Eosinophils Relative 0 %   Eosinophils Absolute 0.0 0.0 - 0.5 K/uL   Basophils Relative 0 %   Basophils Absolute 0.0 0.0 - 0.1 K/uL   Immature Granulocytes 1 %   Abs Immature Granulocytes 0.04 0.00 - 0.07 K/uL  Lactic acid, plasma  Result Value Ref Range   Lactic Acid, Venous 0.5 0.5 - 1.9 mmol/L   CT ABDOMEN PELVIS W CONTRAST  Result Date: 01/02/2020 CLINICAL DATA:  59 year old female with acute  abdominal and pelvic pain with fever. History of Crohn's disease and chronic kidney disease.  EXAM: CT ABDOMEN AND PELVIS WITH CONTRAST TECHNIQUE: Multidetector CT imaging of the abdomen and pelvis was performed using the standard protocol following bolus administration of intravenous contrast. CONTRAST:  40m OMNIPAQUE IOHEXOL 300 MG/ML  SOLN COMPARISON:  12/08/2019 and prior CTs FINDINGS: Lower chest: A 6 mm RIGHT LOWER lobe nodule (series 4: Image 4) is noted. It is difficult to determine if this area was imaged on prior abdomen/pelvic CTs. Hepatobiliary: The liver is unremarkable. The patient is status post cholecystectomy. Mild CBD and intrahepatic biliary dilatation again noted. Pancreas: Mild pancreatic ductal dilatation is unchanged. No focal hepatic abnormalities noted. Spleen: Mild splenomegaly appears increased. Adrenals/Urinary Tract: The kidneys, adrenal glands and bladder are unremarkable. Stomach/Bowel: Evidence of partial colectomy with ileocolonic anastomosis again noted. Unchanged mildly distended fluid and gas-filled colon within the LEFT abdomen and pelvis with equivocal mild circumferential wall thickening. No dilated small bowel loops are noted. Little significant changes noted since the prior study. Vascular/Lymphatic: A LEFT femoral catheter is noted. Mild aortic atherosclerotic calcification noted without aneurysm. No definite enlarged nodes identified. Reproductive: Status post hysterectomy. No adnexal masses. Other: No ascites, abscess or pneumoperitoneum. Musculoskeletal: No acute or suspicious bony abnormality. IMPRESSION: 1. Unchanged mildly distended loops of fluid and gas filled colon within the LEFT abdomen and pelvis with equivocal mild circumferential wall thickening. This may represent a colitis. No evidence of small bowel obstruction or pneumoperitoneum. 2. Mild splenomegaly, apparent slightly increased from the prior study. 3. 6 mm RIGHT LOWER lobe nodule. Non-contrast chest CT at 6-12 months is recommended. If the nodule is stable at time of repeat CT, then future CT at  18-24 months (from today's scan) is considered optional for low-risk patients, but is recommended for high-risk patients. This recommendation follows the consensus statement: Guidelines for Management of Incidental Pulmonary Nodules Detected on CT Images: From the Fleischner Society 2017; Radiology 2017; 284:228-243. 4. Aortic Atherosclerosis (ICD10-I70.0). Electronically Signed   By: JMargarette CanadaM.D.   On: 01/02/2020 09:14   CT ABDOMEN PELVIS W CONTRAST  Result Date: 12/08/2019 CLINICAL DATA:  Abdominal pain EXAM: CT ABDOMEN AND PELVIS WITH CONTRAST TECHNIQUE: Multidetector CT imaging of the abdomen and pelvis was performed using the standard protocol following bolus administration of intravenous contrast. CONTRAST:  857mOMNIPAQUE IOHEXOL 300 MG/ML  SOLN COMPARISON:  November 25, 2019 FINDINGS: Lower chest: The visualized heart size within normal limits. No pericardial fluid/thickening. No hiatal hernia. The visualized portions of the lungs are clear. Hepatobiliary: The liver is normal in density without focal abnormality.The main portal vein is patent. The patient is status post cholecystectomy. Pancreas: Unremarkable. No pancreatic ductal dilatation or surrounding inflammatory changes. Spleen: Normal in size without focal abnormality. Adrenals/Urinary Tract: Both adrenal glands appear normal. The kidneys and collecting system appear normal without evidence of urinary tract calculus or hydronephrosis. Bladder is unremarkable. Stomach/Bowel: The stomach in grossly unremarkable. The patient has had a prior partial colectomy with ileocolonic anastomosis in the transverse colon. There is mildly dilated fluid and air-filled descending colon and sigmoid colon to the level of the sigmoid rectal junction where there appears to be question of diffuse wall thickening and a focal area of narrowing this is best seen on series 2, image 70. There is mild surrounding mesenteric edema seen within this region. This was seen  on prior exam. Vascular/Lymphatic: There are no enlarged mesenteric, retroperitoneal, or pelvic lymph nodes. Again noted is a left femoral venous  catheter with the tip terminating in the IVC. Scattered aortic atherosclerosis. Reproductive: The prostate is unremarkable. Other: There is diffuse mild anasarca. Musculoskeletal: No acute or significant osseous findings. IMPRESSION: Mildly dilated air and fluid-filled descending colon and sigmoid colon with a focal area wall thickening/narrowing at the sigmoid rectal junction. This could be due to focal colitis, infectious or inflammatory. Aortic Atherosclerosis (ICD10-I70.0). Electronically Signed   By: Prudencio Pair M.D.   On: 12/08/2019 02:44   DG Hip Unilat W or Wo Pelvis 1 View Right  Result Date: 12/07/2019 CLINICAL DATA:  PICC line placement EXAM: DG HIP (WITH OR WITHOUT PELVIS) 1V RIGHT COMPARISON:  None. FINDINGS: Left groin PICC line is in place. The tip is in the lower abdomen, likely near the confluence of the iliac veins. IMPRESSION: Left groin PICC tip in the lower abdomen, likely at the confluence of the iliac veins. Electronically Signed   By: Rolm Baptise M.D.   On: 12/07/2019 23:14     MDM   59 y/o F with a h/o Crohn's disease, short gut syndrome, who presents to the ED today for eval of several day h/o bilat lower abd pain, diarrhea, and chills. States sxs feel similar to her past Crohn's flares.  Of note, recent admission for choledocholithiasis and has had cholecystectomy. ERCP on 11/26/19.   Reviewed labs Cbc w/o leukocytosis, anemia present but relatively stable CMP with elevated AST/ALT and alk phos which is new (no RUQ TTP), BUN/Cr slightly elevated from prior (likely prerenal in setting of diarrhea and hypovolemia), no significant electrolyte derangement Lipase WNL UA neg for UTI Blood cultures obtained Lactic acid neg  CT abd/pelvis with unchanged mildly distended loops of fluid and gas filled colon within the LEFT abdomen  and pelvis with equivocal mild circumferential wall thickening. This may represent a colitis. No evidence of small bowel obstruction or pneumoperitoneum. Mild splenomegaly, apparent slightly increased from the prior study. There a 6 mm RIGHT LOWER lobe nodule. 4. Aortic Atherosclerosis  Pt initially febrile but VS otherwise stable and labs do not reveal and signs of sepsis. She states fever is normal for her during a crohn's flare. Plan is to consult GI And admit for further tx.  10:39 AM CONSULT With Carl Best, NP with gastroenterology who recommends obtaining mult labs. She recommended holding abx and steroid until GI can assess the patient.   11:22 AM CONSULT With Dr. Tamala Julian with hospitalist service who accepts patient for admission    B[place] 01/02/20 1122    Maudie Flakes, MD 01/07/20 [phone]

## 2020-01-02 NOTE — Progress Notes (Addendum)
Pharmacy Antibiotic Note  Caitlin Peters is a 59 y.o. female admitted on 01/02/2020 with E cloacae  bacteremia.  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   Patient with femoral PICC for TPN just changed 2d PTA. Grew E cloacae 12/30 and was on levofloxacin then ciprofloxacin and metronidazole from 1/4 - 1/16. Cultures positive within 12hr of drawing. Febrile to 101 on admit but afebrile since without antibiotics. WBC wnl, LA wnl.    Patient with documented reaction of CNS toxicity with cefepime during AKI but tolerated ceftriaxone. No hx of receiving cefepime here. Current renal function appears to be at baseline. Spoke with RN who was unable to get any meaningful information from patient due to patient just receiving lorazepam for MRI. Spoke with NP and changed to pip/tazo for safety.   Plan: Pip/tazo 3.375g Q8 hr  Monitor clinical status, renal fx, f/u duration   Height: 5' 8"  (172.7 cm) Weight: 137 lb 9.1 oz (62.4 kg) IBW/kg (Calculated) : 63.9  Temp (24hrs), Avg:99.8 F (37.7 C), Min:98.3 F (36.8 C), Max:101 F (38.3 C)  Recent Labs  Lab 01/02/20 0231 01/02/20 0734  WBC 8.1  8.1  --   CREATININE 1.63*  --   LATICACIDVEN  --  0.5    Estimated Creatinine Clearance: 37.1 mL/min (A) (by C-G formula based on SCr of 1.63 mg/dL (H)).    Allergies  Allergen Reactions  . Cefepime Other (See Comments)    Neurotoxicity occurring in setting of AKI. Ceftriaxone tolerated during same admit  . Gabapentin Other (See Comments)    unknown  . Hyoscyamine Hives and Swelling    Legs swelling   Disorientation  . Lyrica [Pregabalin] Other (See Comments)    unknown  . Meperidine Hives    Other reaction(s): GI Upset Due to [place]   . Topamax [Topiramate] Other (See Comments)    unknown  . Fentanyl Rash  . Morphine And Related Rash    Antimicrobials this admission: Pip/tazo 2/6 >>    Microbiology results: 2/6  BCx: E cloacae   Benetta Spar, PharmD, BCPS, BCCP Clinical Pharmacist  Please check AMION for all [place] phone numbers After 10:00 PM, call [place] [phone]

## 2020-01-02 NOTE — ED Provider Notes (Signed)
[place] EMERGENCY DEPARTMENT Provider Note   CSN: [id number] Arrival date & time: 01/02/20  0211     History Chief Complaint  Patient presents with  . Abdominal Pain    Caitlin Peters is a 59 y.o. female with a history of short gut syndrome, Crohn disease, GERD, HTN who presents to the emergency department with a chief complaint of abdominal pain and fever.  The patient endorses fever accompanied by chills, bilateral lower abdominal pain, and frequent, forceful "green" diarrhea that began over the last few days.  She reports that the symptoms are similar to Crohn flares that she has had in the past.  Reports that she has been trying to manage her fever at home with Tylenol, but her pain and symptoms have been continuing to progress since onset.  She reports that she was having nausea and dry heaving over the last few days, but the symptoms have since resolved.  She denies dysuria, hematuria, vaginal pain or discharge, chest pain, shortness of breath, back pain, vomiting, rash, melena, hematochezia, rectal pain.  The patient was recently established with a Crohn's specialist, Dr. Domenica Fail at [facility].  She is also followed by Dr. Tarri Glenn with gastroenterology at [facility].  She currently takes 9 mg of budesonide daily for Crohn's.  She has a history of 7-8 previous surgeries, small bowel obstructions on multiple occasions, and has been on chronic TPN since 2003.  She also has a history of chronic pain and takes methadone and oral Dilaudid.   She was most recently admitted from 12/30 to 01/04 with complaints of abdominal pain, nausea, vomiting, diarrhea.  She was found to have pancolitis and worsening LFTs.  She was also found to have ascending cholangitis with Enterobacter bacteremia.  She underwent ERCP with sphincterotomy and improved.  She was discharged home with oral Levaquin after receiving IV Levaquin during her admission.  She has a left femoral PICC line in place.  She has  been compliant with her home TPN.   The history is provided by the patient. No language interpreter was used.       Past Medical History:  Diagnosis Date  . Anasarca 10/2019  . AVN (avascular necrosis of bone) ([place])   . Cataract   . Chronic pain syndrome   . CKD (chronic kidney disease), stage III   . Crohn disease ([place])   . Crohn disease ([place])   . Depression   . Diverticulosis   . GERD (gastroesophageal reflux disease)   . HTN (hypertension)   . IDA (iron deficiency anemia)   . Malnutrition ([place])   . Mass in chest   . Osteoporosis   . Pancreatitis   . Short gut syndrome   . Vitamin B12 deficiency     Patient Active Problem List   Diagnosis Date Noted  . Cholangitis   . Generalized abdominal pain   . Intractable abdominal pain 11/25/2019  . Anasarca 10/28/2019  . Acute kidney injury superimposed on chronic kidney disease ([place]) 10/28/2019  . Falls 10/28/2019  . Crohn disease ([place])   . Malnutrition ([place])   . Short gut syndrome   . HTN (hypertension)   . Vitamin B12 deficiency   . Osteoporosis   . Depression   . GERD (gastroesophageal reflux disease)   . Chronic pain syndrome   . Hypokalemia due to excessive gastrointestinal loss of potassium 10/13/2019  . Diarrhea   . Fever   . Hypokalemia 10/12/2019  . Chronic diarrhea 10/12/2019  . Dysuria 10/12/2019  .  Bilateral lower extremity edema 10/12/2019  . Crohn's disease ([place]) 10/12/2019  . Anemia 10/12/2019  . Hypertension 10/12/2019  . AKI (acute kidney injury) ([place]) 10/12/2019    Past Surgical History:  Procedure Laterality Date  . ABDOMINAL HYSTERECTOMY    . ABDOMINAL SURGERY     colon resection with abdominal stoma, repair of intestinal leak, reversal of abdominal stoma  . BILIARY DILATION  11/26/2019   Procedure: BILIARY DILATION;  Surgeon: Jackquline Denmark, MD;  Location: [facility] ENDOSCOPY;  Service: Endoscopy;;  . CHEST WALL RESECTION     right thoracotomy,resection of chest mass with anterior rib and  reconstruction using prosthetic mesh and video arthroscopy  . CHOLECYSTECTOMY    . COLONOSCOPY  2019  . ERCP N/A 11/26/2019   Procedure: ENDOSCOPIC RETROGRADE CHOLANGIOPANCREATOGRAPHY (ERCP);  Surgeon: Jackquline Denmark, MD;  Location: [facility];  Service: Endoscopy;  Laterality: N/A;  . IR US GUIDE VASC ACCESS LEFT     x 2 06/17/19 and 09/14/2019  . KNEE SURGERY     right knee   . REMOVAL OF STONES  11/26/2019   Procedure: REMOVAL OF STONES;  Surgeon: Jackquline Denmark, MD;  Location: [facility] ENDOSCOPY;  Service: Endoscopy;;  . SMALL INTESTINE SURGERY     x3  . SPHINCTEROTOMY  11/26/2019   Procedure: SPHINCTEROTOMY;  Surgeon: Jackquline Denmark, MD;  Location: [facility];  Service: Endoscopy;;  . UPPER GASTROINTESTINAL ENDOSCOPY       OB History   No obstetric history on file.     Family History  Problem Relation Age of Onset  . Breast cancer Sister   . Multiple sclerosis Sister   . Diabetes Sister   . Lupus Sister   . Colon cancer Other   . Crohn's disease Other   . Seizures Mother   . Glaucoma Mother   . CAD Father   . Heart disease Father   . Hypertension Father     Social History   Tobacco Use  . Smoking status: Former Research scientist (life sciences)  . Smokeless tobacco: Never Used  Substance Use Topics  . Alcohol use: Not Currently  . Drug use: Never    Home Medications Prior to Admission medications   Medication Sig Start Date End Date Taking? Authorizing Provider  acetaminophen (TYLENOL) 325 MG tablet Take 650 mg by mouth every 6 (six) hours as needed for mild pain.    Yes [provider]  amLODipine (NORVASC) 5 MG tablet Take 1 tablet (5 mg total) by mouth daily. 10/16/19  Yes [name], [name], MD  budesonide (ENTOCORT EC) 3 MG 24 hr capsule Take 3 capsules (9 mg total) by mouth daily. 12/15/19 03/14/20 Yes Erline Hau, MD  buPROPion [facility] SR) 100 MG 12 hr tablet Take 2 tablets (200 mg total) by mouth daily. 12/15/19 03/14/20 Yes Erline Hau, MD  calcium carbonate (OS-CAL) 1250 (500 Ca) MG chewable tablet Chew 1 tablet by mouth 2 (two) times daily.   Yes [provider]  cholecalciferol (VITAMIN D3) 25 MCG (1000 UT) tablet Take 2,000 Units by mouth daily.   Yes [provider]  cycloSPORINE (RESTASIS) 0.05 % ophthalmic emulsion Place 1 drop into both eyes 2 (two) times daily.   Yes [provider]  dexlansoprazole (DEXILANT) 60 MG capsule Take 1 capsule (60 mg total) by mouth daily. 10/16/19  Yes [name], [name], MD  Dextran 70-Hypromellose 0.1-0.3 % SOLN Place 1 drop into both eyes 4 (four) times daily.   Yes [provider]  dicyclomine (BENTYL) 20 MG tablet Take 20 mg by mouth 3 (three) times daily. 10/26/19  Yes [provider]  diphenoxylate-atropine (LOMOTIL) 2.5-0.025 MG tablet Take 1 tablet by mouth 4 (four) times daily as needed for diarrhea or loose stools. 10/31/19  Yes Dessa Phi, DO  DULoxetine (CYMBALTA) 30 MG capsule Take 3 capsules (90 mg total) by mouth daily. 11/01/19  Yes Dessa Phi, DO  estradiol (ESTRACE) 2 MG tablet Take 1 tablet (2 mg total) by mouth daily. 12/10/19  Yes [name], [name], MD  famotidine (PEPCID) 20 MG tablet Take 20 mg by mouth at bedtime.   Yes [provider]  HYDROmorphone (DILAUDID) 4 MG tablet Take 1 tablet (4 mg total) by mouth every 6 (six) hours as needed for severe pain. 12/06/19  Yes Ripley Fraise, MD  lipase/protease/amylase (CREON) 36000 UNITS CPEP capsule Take 3 capsules (108,000 Units total) by mouth 3 (three) times daily before meals. 12/15/19 03/14/20 Yes Erline Hau, MD  loperamide (IMODIUM) 2 MG capsule Take 1 capsule (2 mg total) by mouth as needed for diarrhea or loose stools. 12/31/19  Yes [name], [name], MD  metoprolol tartrate (LOPRESSOR) 100 MG tablet Take 100 mg by mouth 2 (two) times daily.   Yes [provider]  Multiple Vitamins-Minerals (MULTIVITAMIN ADULT  PO) Take 1 tablet by mouth daily.   Yes [provider]  Omega-3 1000 MG CAPS Take 1 capsule by mouth daily.   Yes [provider]  potassium chloride SA (KLOR-CON) 20 MEQ tablet Take 2 tablets (40 mEq total) by mouth daily. 10/16/19  Yes [name], [name], MD  Probiotic Product (PROBIOTIC-10 PO) Take 1 capsule by mouth daily.   Yes [provider]  promethazine (PHENERGAN) 25 MG tablet TAKE 1 TABLET BY MOUTH EVERY 6 HOURS AS NEEDED FOR NAUSEA FOR VOMITING Patient taking differently: Take 25 mg by mouth every 6 (six) hours as needed for nausea or vomiting.  12/23/19  Yes [name], [name], MD  sucralfate (CARAFATE) 1 GM/10ML suspension Take 10 mLs (1 g total) by mouth 4 (four) times daily -  with meals and at bedtime. 12/15/19  Yes [name], [name], MD  traZODone (DESYREL) 100 MG tablet Take 2 tablets (200 mg total) by mouth at bedtime as needed for sleep. 12/31/19  Yes [name], [name], MD  vitamin B-12 (CYANOCOBALAMIN) 100 MCG tablet Take 1,000 mcg by mouth daily.    Yes [provider]  methadone (DOLOPHINE) 10 MG tablet Take 2 tablets (20 mg total) by mouth every 12 (twelve) hours. Patient not taking: Reported on 12/15/2019 11/30/19   Shelly Coss, MD  potassium chloride (KLOR-CON) 10 MEQ tablet Take 2 tablets (20 mEq total) by mouth daily for 2 days. Patient not taking: Reported on 01/02/2020 12/08/19 01/01/29  Deliah Boston, PA-C  PRESCRIPTION MEDICATION Home TPN [facility]     [provider]    Allergies    Cefepime, Gabapentin, Hyoscyamine, Lyrica [pregabalin], Meperidine, Topamax [topiramate], Fentanyl, and Morphine and related  Review of Systems   Review of Systems  Constitutional: Positive for chills and fever. Negative for activity change, diaphoresis and fatigue.  Respiratory: Negative for shortness of breath.   Cardiovascular: Negative for chest pain.  Gastrointestinal: Positive for  abdominal pain, diarrhea and nausea. Negative for blood in stool, constipation and vomiting.  Genitourinary: Negative for dysuria and urgency.  Musculoskeletal: Negative for back pain, gait problem, joint swelling, myalgias, neck pain and  neck stiffness.  Skin: Negative for rash.  Allergic/Immunologic: Negative for immunocompromised state.  Neurological: Positive for weakness. Negative for dizziness, syncope, numbness and headaches.  Psychiatric/Behavioral: Negative for confusion.    Physical Exam Updated Vital Signs BP (!) 151/65   Pulse 76   Temp 98.3 F (36.8 C) (Oral)   Resp 18   SpO2 100%   Physical Exam Vitals and nursing note reviewed.  Constitutional:      General: She is not in acute distress.    Comments: Chronically ill-appearing thin female  HENT:     Head: Normocephalic.  Eyes:     Conjunctiva/sclera: Conjunctivae normal.  Cardiovascular:     Rate and Rhythm: Normal rate and regular rhythm.     Pulses: Normal pulses.     Heart sounds: Normal heart sounds. No murmur. No friction rub. No gallop.   Pulmonary:     Effort: Pulmonary effort is normal. No respiratory distress.     Breath sounds: No stridor. No wheezing, rhonchi or rales.  Chest:     Chest wall: No tenderness.  Abdominal:     General: There is no distension.     Palpations: Abdomen is soft.     Tenderness: There is abdominal tenderness.     Comments: Abdomen is distended.  There is some firmness, but it is not hard.  Although she is diffusely tender to palpation throughout the abdomen, maximal tenderness is in the bilateral lower abdomen.  No rebound or guarding.  Hypoactive bowel sounds in all 4 quadrants.  No tenderness over McBurney's point.  Negative Murphy sign.  No CVA tenderness bilaterally.  Musculoskeletal:     Cervical back: Neck supple.     Right lower leg: No edema.     Left lower leg: No edema.  Skin:    General: Skin is warm.     Coloration: Skin is not jaundiced.     Findings: No  rash.     Comments: PICC line is in place to the left groin, upper thigh with no surrounding redness or tenderness.  Neurological:     Mental Status: She is alert.  Psychiatric:        Behavior: Behavior normal.     ED Results / Procedures / Treatments   Labs (all labs ordered are listed, but only abnormal results are displayed) Labs Reviewed  COMPREHENSIVE METABOLIC PANEL - Abnormal; Notable for the following components:      Result Value   Glucose, Bld 102 (*)    BUN 46 (*)    Creatinine, Ser 1.63 (*)    Total Protein 6.3 (*)    Albumin 2.7 (*)    AST 447 (*)    ALT 403 (*)    Alkaline Phosphatase 300 (*)    GFR calc non Af Amer 34 (*)    GFR calc Af Amer 40 (*)    All other components within normal limits  CBC - Abnormal; Notable for the following components:   RBC 2.82 (*)    Hemoglobin 8.5 (*)    HCT 26.3 (*)    All other components within normal limits  URINALYSIS, ROUTINE W REFLEX MICROSCOPIC - Abnormal; Notable for the following components:   Protein, ur 30 (*)    All other components within normal limits  CBC WITH DIFFERENTIAL/PLATELET - Abnormal; Notable for the following components:   RBC 2.76 (*)    Hemoglobin 8.4 (*)    HCT 26.0 (*)    Lymphs Abs 0.3 (*)  All other components within normal limits  CULTURE, BLOOD (ROUTINE X 2)  CULTURE, BLOOD (ROUTINE X 2)  NOVEL CORONAVIRUS, NAA (HOSP ORDER, SEND-OUT TO REF LAB; TAT 18-24 HRS)  LIPASE, BLOOD  LACTIC ACID, PLASMA  LACTIC ACID, PLASMA    EKG None  Radiology No results found.  Procedures Procedures (including critical care time)  Medications Ordered in ED Medications  HYDROmorphone (DILAUDID) injection 1 mg (1 mg Intravenous Given 01/02/20 0534)  sodium chloride 0.9 % bolus 1,000 mL (0 mLs Intravenous Stopped 01/02/20 0654)  ondansetron (ZOFRAN) injection 4 mg (4 mg Intravenous Given 01/02/20 0654)  HYDROmorphone (DILAUDID) injection 0.5 mg (0.5 mg Intravenous Given 01/02/20 0654)  iohexol  (OMNIPAQUE) 300 MG/ML solution 75 mL (75 mLs Intravenous Contrast Given 01/02/20 0813)    ED Course  I have reviewed the triage vital signs and the nursing notes.  Pertinent labs & imaging results that were available during my care of the patient were reviewed by me and considered in my medical decision making (see chart for details).    MDM Rules/Calculators/A&P                      59 year old female with a history of short gut syndrome, Crohn disease, GERD, HTN who presents to the emergency department with fever, chills, worsening green diarrhea, and bilateral lower abdominal pain.  On exam, she is chronically ill-appearing, but does not appear toxic at this time.  Abdomen is moderately distended with some firmness, but not hard.  Hypoactive bowel sounds.  Although she is diffusely tender to palpation, maximal tenderness is in the bilateral lower quadrants.  There is no tenderness over McBurney's point.  PICC line is in place to the left groin with no surrounding redness or tenderness.  Labs are notable for alkaline phosphatase of 300, AST of 447, and ALT of 403.  These are acutely elevated from 01/18 when all 3 labs were normal.  Per chart review, she did have ascending cholangitis during recent hospital admission and underwent ERCP.  On exam, she has no focal tenderness in the right upper quadrant.  Although she was afebrile on arrival, she is otherwise hemodynamically stable.  Fever has since resolved.  She has no leukocytosis.  No left shift on differential.  Lactate is pending.  She does not meet sepsis criteria at this time.  Given her history of Crohn's disease, I think it is reasonable to hold antibiotics and repeat CT abdomen pelvis.  She does have a slight AKI and fluids have been ordered.  Pain medication has been ordered.  Patient care transferred to [facility] at the end of my shift pending CT A/P, likely GI consult and admission. Patient presentation, ED course, and plan of care  discussed with review of all pertinent labs and imaging. Please see his/her note for further details regarding further ED course and disposition.  Final Clinical Impression(s) / ED Diagnoses Final diagnoses:  None    Rx / DC Orders ED Discharge Orders    None       Joanne Gavel, PA-C 01/02/20 0831    Maudie Flakes, MD 01/07/20 [phone]

## 2020-01-02 NOTE — Consult Note (Signed)
Referring Provider: No ref. provider found Primary Care Physician:  [name], [name], MD Primary Gastroenterologist:  [place]   Reason for Consultation:  Crohn's  flare   HPI: Caitlin Peters is a 59 y.o. female past medical history of depression, hypertension, osteoporosis, chronic pain syndrome on Methadone and oral Dilaudid, hypothyroidism, chronic kidney disease stage III, Crohn's disease initially diagnosed in 1984 and short gut syndrome (previously followed by the center for shortgut rehabilitation therapy at the [facility] clinic since 2012 for management of malabsorption). She has been on TPN since 2003.  Surgical history significant for 7-8 bowel surgeries. She had a possible mobile echodensity within a small pericardial recess that was resected 06/13/2015 with a right thoracotomy. She had a partial hysterectomy with left salpingo-oophorectomy in 1984.  She had a right salpingo-oophorectomy in 1990.  She had a cholecystectomy in 2018. She was initially seen in our office by Dr. Tarri Glenn 11/25/2019 and she referred to IBD specialist Dr. Domenica Fail at [facility]. She is taking Budesonide 9 mg daily for her Crohn's disease.  Her last colonoscopy was  06/09/2018 at the [facility] with Dr. Marcy Siren which showed patent side-to-side ileocolonic anastomosis, healthy-appearing mucosa, small bowel mucosa with no diagnostic alteration. Colonic mucosa with no diagnostic alteration.  No evidence of granulomas or dysplasia.  In review of her records, she was admitted to the hospital 12/31- 1/04 with nausea, vomiting, diarrhea and abdominal pain.  An abdominal/pelvic CT at that time identified pancolitis with elevated LFTs. She was diagnosed with ascending cholangitis with Enterobacter bacteremia.  She underwent an ERCP with sphincterotomy 12/31 which showed sludge, no stones were extracted.  Her abdominal pain and LFTs improved and she was discharged home on oral Levaquin.   She developed  nausea/vomiting worsening lower abdominal pain and a fever of 101 on 2/5. She presented to the [facility] ED for further evaluation.  She reports having chronic upper and lower abdominal pain, her lower abdominal pain has progressively worsened over the past few days. She is passing 8 watery nonbloody stools daily which is her normal bowel pattern. She is urinating clear yellow urine. She is on TPN 20hrs daily which she stated she has been compliant with. No NSAIDS. No alcohol or drug use.   ED course 01/02/2020: Sodium 137.  Potassium 3.5.  Chloride 102.  Glucose 102.  BUN 46.  Creatinine 1.63.  Calcium 9.0.  Alk phos 300.  Albumin 2.7.  Lipase 16.  AST 447.  ALT 403.  Total bili 0.9.  WBC 8.1.  Hemoglobin 8.4.  Hematocrit 26.0.  MCV 94.2.  Platelet 190.  Lactic acid 0.5.  Abdominal/pelvic CT with contrast: -Hepatobiliary: The liver is unremarkable. The patient is status post cholecystectomy. Mild CBD and intrahepatic biliary dilatation again noted. -Pancreas: Mild pancreatic ductal dilatation is unchanged. No focal hepatic abnormalities noted. -Unchanged mildly distended loops of fluid and gas filled colon within the LEFT abdomen and pelvis with equivocal mild circumferential wall thickening. This may represent a colitis. No evidence of small bowel obstruction or pneumoperitoneum. -Mild splenomegaly, apparent slightly increased from the prior study.  -6 mm RIGHT LOWER lobe nodule. Non-contrast chest CT at 6-12 months is recommended.   -Aortic Atherosclerosis   ERCP 11/26/2019: -Biliary papillary stenosis, benign. -Choledocholithiasis (sludge) s/p biliary sphincterotomy, sphincteroplasty followed by balloon extraction. -No PSC. -Previous cholecystectomy.   Past Medical History:  Diagnosis Date  . Anasarca 10/2019  . AVN (avascular necrosis of bone) ([place])   . Cataract   . Chronic pain  syndrome   . CKD (chronic kidney disease), stage III   . Crohn disease ([place])   . Crohn disease ([place])     . Depression   . Diverticulosis   . GERD (gastroesophageal reflux disease)   . HTN (hypertension)   . IDA (iron deficiency anemia)   . Malnutrition ([place])   . Mass in chest   . Osteoporosis   . Pancreatitis   . Short gut syndrome   . Vitamin B12 deficiency     Past Surgical History:  Procedure Laterality Date  . ABDOMINAL HYSTERECTOMY    . ABDOMINAL SURGERY     colon resection with abdominal stoma, repair of intestinal leak, reversal of abdominal stoma  . BILIARY DILATION  11/26/2019   Procedure: BILIARY DILATION;  Surgeon: Jackquline Denmark, MD;  Location: [facility];  Service: Endoscopy;;  . CHEST WALL RESECTION     right thoracotomy,resection of chest mass with anterior rib and reconstruction using prosthetic mesh and video arthroscopy  . CHOLECYSTECTOMY    . COLONOSCOPY  2019  . ERCP N/A 11/26/2019   Procedure: ENDOSCOPIC RETROGRADE CHOLANGIOPANCREATOGRAPHY (ERCP);  Surgeon: Jackquline Denmark, MD;  Location: [facility];  Service: Endoscopy;  Laterality: N/A;  . IR US GUIDE VASC ACCESS LEFT     x 2 06/17/19 and 09/14/2019  . KNEE SURGERY     right knee   . REMOVAL OF STONES  11/26/2019   Procedure: REMOVAL OF STONES;  Surgeon: Jackquline Denmark, MD;  Location: [facility];  Service: Endoscopy;;  . SMALL INTESTINE SURGERY     x3  . SPHINCTEROTOMY  11/26/2019   Procedure: SPHINCTEROTOMY;  Surgeon: Jackquline Denmark, MD;  Location: [facility];  Service: Endoscopy;;  . UPPER GASTROINTESTINAL ENDOSCOPY      Prior to Admission medications   Medication Sig Start Date End Date Taking? Authorizing Provider  acetaminophen (TYLENOL) 325 MG tablet Take 650 mg by mouth every 6 (six) hours as needed for mild pain.    Yes [provider]  amLODipine (NORVASC) 5 MG tablet Take 1 tablet (5 mg total) by mouth daily. 10/16/19  Yes [name], [name], MD  budesonide (ENTOCORT EC) 3 MG 24 hr capsule Take 3 capsules (9 mg total) by mouth daily. 12/15/19 03/14/20 Yes Erline Hau, MD  buPROPion [facility] SR) 100 MG 12 hr tablet Take 2 tablets (200 mg total) by mouth daily. 12/15/19 03/14/20 Yes Erline Hau, MD  calcium carbonate (OS-CAL) 1250 (500 Ca) MG chewable tablet Chew 1 tablet by mouth 2 (two) times daily.   Yes [provider]  cholecalciferol (VITAMIN D3) 25 MCG (1000 UT) tablet Take 2,000 Units by mouth daily.   Yes [provider]  cycloSPORINE (RESTASIS) 0.05 % ophthalmic emulsion Place 1 drop into both eyes 2 (two) times daily.   Yes [provider]  dexlansoprazole (DEXILANT) 60 MG capsule Take 1 capsule (60 mg total) by mouth daily. 10/16/19  Yes [name], [name], MD  Dextran 70-Hypromellose 0.1-0.3 % SOLN Place 1 drop into both eyes 4 (four) times daily.   Yes [provider]  dicyclomine (BENTYL) 20 MG tablet Take 20 mg by mouth 3 (three) times daily. 10/26/19  Yes [provider]  diphenoxylate-atropine (LOMOTIL) 2.5-0.025 MG tablet Take 1 tablet by mouth 4 (four) times daily as needed for diarrhea or loose stools. 10/31/19  Yes Dessa Phi, DO  DULoxetine (CYMBALTA) 30 MG capsule Take 3 capsules (90 mg total) by mouth daily. 11/01/19  Yes Dessa Phi,  DO  estradiol (ESTRACE) 2 MG tablet Take 1 tablet (2 mg total) by mouth daily. 12/10/19  Yes [name], [name], MD  famotidine (PEPCID) 20 MG tablet Take 20 mg by mouth at bedtime.   Yes [provider]  HYDROmorphone (DILAUDID) 4 MG tablet Take 1 tablet (4 mg total) by mouth every 6 (six) hours as needed for severe pain. 12/06/19  Yes Ripley Fraise, MD  lipase/protease/amylase (CREON) 36000 UNITS CPEP capsule Take 3 capsules (108,000 Units total) by mouth 3 (three) times daily before meals. 12/15/19 03/14/20 Yes Erline Hau, MD  loperamide (IMODIUM) 2 MG capsule Take 1 capsule (2 mg total) by mouth as needed for diarrhea or loose stools. 12/31/19  Yes [name], [name], MD  metoprolol  tartrate (LOPRESSOR) 100 MG tablet Take 100 mg by mouth 2 (two) times daily.   Yes [provider]  Multiple Vitamins-Minerals (MULTIVITAMIN ADULT PO) Take 1 tablet by mouth daily.   Yes [provider]  Omega-3 1000 MG CAPS Take 1 capsule by mouth daily.   Yes [provider]  potassium chloride SA (KLOR-CON) 20 MEQ tablet Take 2 tablets (40 mEq total) by mouth daily. 10/16/19  Yes [name], [name], MD  Probiotic Product (PROBIOTIC-10 PO) Take 1 capsule by mouth daily.   Yes [provider]  promethazine (PHENERGAN) 25 MG tablet TAKE 1 TABLET BY MOUTH EVERY 6 HOURS AS NEEDED FOR NAUSEA FOR VOMITING Patient taking differently: Take 25 mg by mouth every 6 (six) hours as needed for nausea or vomiting.  12/23/19  Yes [name], [name], MD  sucralfate (CARAFATE) 1 GM/10ML suspension Take 10 mLs (1 g total) by mouth 4 (four) times daily -  with meals and at bedtime. 12/15/19  Yes [name], [name], MD  traZODone (DESYREL) 100 MG tablet Take 2 tablets (200 mg total) by mouth at bedtime as needed for sleep. 12/31/19  Yes [name], [name], MD  vitamin B-12 (CYANOCOBALAMIN) 100 MCG tablet Take 1,000 mcg by mouth daily.    Yes [provider]  methadone (DOLOPHINE) 10 MG tablet Take 2 tablets (20 mg total) by mouth every 12 (twelve) hours. Patient not taking: Reported on 12/15/2019 11/30/19   Shelly Coss, MD  potassium chloride (KLOR-CON) 10 MEQ tablet Take 2 tablets (20 mEq total) by mouth daily for 2 days. Patient not taking: Reported on 01/02/2020 12/08/19 01/01/29  Deliah Boston, PA-C  PRESCRIPTION MEDICATION Home TPN [employer] in [facility] [place]    [provider]    Current Facility-Administered Medications  Medication Dose Route Frequency Provider Last Rate Last Admin  . 0.9 %  sodium chloride infusion   Intravenous Continuous Fuller Plan A, MD 100 mL/hr at 01/02/20 1409 New Bag at 01/02/20 1409  .  acetaminophen (TYLENOL) tablet 650 mg  650 mg Oral Q6H PRN Norval Morton, MD       Or  . acetaminophen (TYLENOL) suppository 650 mg  650 mg Rectal Q6H PRN Fuller Plan A, MD      . acidophilus (RISAQUAD) capsule 1 capsule  1 capsule Oral Daily Skeet Simmer, RPH      . albuterol (PROVENTIL) (2.5 MG/3ML) 0.083% nebulizer solution 2.5 mg  2.5 mg Nebulization Q6H PRN Smith, Rondell A, MD      . amLODipine (NORVASC) tablet 5 mg  5 mg Oral Daily Smith, Rondell A, MD      . budesonide (ENTOCORT EC) 24 hr capsule 9 mg  9 mg Oral Daily Smith, Rondell A, MD      . cycloSPORINE (RESTASIS) 0.05 % ophthalmic emulsion 1 drop  1 drop Both Eyes BID Smith, Rondell A, MD      . dicyclomine (BENTYL) tablet 20 mg  20 mg Oral TID Fuller Plan A, MD      . diphenoxylate-atropine (LOMOTIL) 2.5-0.025 MG per tablet 1 tablet  1 tablet Oral QID PRN Smith, Rondell A, MD      . enoxaparin (LOVENOX) injection 40 mg  40 mg Subcutaneous Q24H Smith, Rondell A, MD   40 mg at 01/02/20 1416  . estradiol (ESTRACE) tablet 2 mg  2 mg Oral Daily Smith, Rondell A, MD      . famotidine (PEPCID) tablet 20 mg  20 mg Oral QHS Smith, Rondell A, MD      . HYDROmorphone (DILAUDID) injection 1-2 mg  1-2 mg Intravenous Q2H PRN Fuller Plan A, MD   2 mg at 01/02/20 1410  . hydroxypropyl methylcellulose / hypromellose (ISOPTO TEARS / GONIOVISC) 2.5 % ophthalmic solution 1 drop  1 drop Both Eyes QID Skeet Simmer, [facility]      . lipase/protease/amylase (CREON) capsule 108,000 Units  108,000 Units Oral TID AC Smith, Rondell A, MD      . metoprolol tartrate (LOPRESSOR) tablet 100 mg  100 mg Oral BID Smith, Rondell A, MD      . ondansetron (ZOFRAN) tablet 4 mg  4 mg Oral Q6H PRN Fuller Plan A, MD       Or  . ondansetron (ZOFRAN) injection 4 mg  4 mg Intravenous Q6H PRN Smith, Rondell A, MD      . pantoprazole (PROTONIX) EC tablet 40 mg  40 mg Oral Daily Smith, Rondell A, MD      . promethazine (PHENERGAN) injection 25 mg  25 mg  Intravenous Q6H PRN Tamala Julian, Rondell A, MD   25 mg at 01/02/20 1411  . sodium chloride flush (NS) 0.9 % injection 3 mL  3 mL Intravenous Q12H Smith, Rondell A, MD   3 mL at 01/02/20 1427  . sucralfate (CARAFATE) 1 GM/10ML suspension 1 g  1 g Oral TID WC & HS Fuller Plan A, MD        Allergies as of 01/02/2020 - Review Complete 01/02/2020  Allergen Reaction Noted  . Cefepime Other (See Comments) 11/27/2017  . Gabapentin Other (See Comments) 10/13/2019  . Hyoscyamine Hives and Swelling 07/15/2014  . Lyrica [pregabalin] Other (See Comments) 10/13/2019  . Meperidine Hives 08/14/2011  . Topamax [topiramate] Other (See Comments) 10/13/2019  . Fentanyl Rash 10/12/2019  . Morphine and related Rash 10/12/2019    Family History  Problem Relation Age of Onset  . Breast cancer Sister   . Multiple sclerosis Sister   . Diabetes Sister   . Lupus Sister   . Colon cancer Other   . Crohn's disease Other   . Seizures Mother   . Glaucoma Mother   . CAD Father   . Heart disease Father   . Hypertension Father     Social History   Socioeconomic History  . Marital status: Single    Spouse name: Not on file  . Number of children: Not on file  . Years of education: Not on file  . Highest education level: Not on file  Occupational History  . Not on file  Tobacco Use  . Smoking status: Former Research scientist (life sciences)  . Smokeless tobacco: Never Used  Substance and Sexual Activity  . Alcohol use:  Not Currently  . Drug use: Never  . Sexual activity: Not on file  Other Topics Concern  . Not on file  Social History Narrative  . Not on file   Social Determinants of Health   Financial Resource Strain:   . Difficulty of Paying Living Expenses: Not on file  Food Insecurity:   . Worried About Charity fundraiser in the Last Year: Not on file  . Ran Out of Food in the Last Year: Not on file  Transportation Needs:   . Lack of Transportation (Medical): Not on file  . Lack of Transportation (Non-Medical): Not  on file  Physical Activity:   . Days of Exercise per Week: Not on file  . Minutes of Exercise per Session: Not on file  Stress:   . Feeling of Stress : Not on file  Social Connections:   . Frequency of Communication with Friends and Family: Not on file  . Frequency of Social Gatherings with Friends and Family: Not on file  . Attends Religious Services: Not on file  . Active Member of Clubs or Organizations: Not on file  . Attends Archivist Meetings: Not on file  . Marital Status: Not on file  Intimate Partner Violence:   . Fear of Current or Ex-Partner: Not on file  . Emotionally Abused: Not on file  . Physically Abused: Not on file  . Sexually Abused: Not on file   ROS: See HPI, all other systems reviewed and are negative  Physical Exam: Vital signs in last 24 hours: Temp:  [98.3 F (36.8 C)-101 F (38.3 C)] 98.8 F (37.1 C) (02/06 1335) Pulse Rate:  [39-88] 72 (02/06 1335) Resp:  [13-19] 19 (02/06 1335) BP: (120-156)/(64-93) 127/79 (02/06 1335) SpO2:  [96 %-100 %] 100 % (02/06 1335) Weight:  [62.4 kg] 62.4 kg (02/06 1428) Last BM Date: 01/02/20 General:  Alert 32-year-old female in no acute distress. Head:  Normocephalic and atraumatic. Eyes:  No scleral icterus. Conjunctiva pink. Ears:  Normal auditory acuity. Nose:  No deformity, discharge or lesions. Mouth: No deformity or lesions.   Neck:  Supple. No lymphadenopathy or thyromegaly.  Lungs: Breath sounds clear throughout. Heart: RRR, systolic murmur. Abdomen:  Flat, generalized tenderness LUQ, epigastric and throughout the lower abdomen without rebound or guarding.+ BS x 4 quads. No HSM. Rectal:  Deferred. Msk:  Symmetrical without gross deformities.  Pulses:  Normal pulses noted. Extremities:  Without clubbing or edema. Neurologic:  Alert and  oriented x4. No focal deficits.  Skin:  Intact without significant lesions or rashes. Psych:  Alert and cooperative. Normal mood and affect.  Intake/Output  from previous day: 02/05 0701 - 02/06 0700 In: 1000 [IV Piggyback:1000] Out: -  Intake/Output this shift: No intake/output data recorded.  Lab Results: Recent Labs    01/02/20 0231  WBC 8.1  8.1  HGB 8.4*  8.5*  HCT 26.0*  26.3*  PLT 190  191   BMET Recent Labs    01/02/20 0231  NA 137  K 3.5  CL 102  CO2 24  GLUCOSE 102*  BUN 46*  CREATININE 1.63*  CALCIUM 9.0   LFT Recent Labs    01/02/20 0231  PROT 6.3*  ALBUMIN 2.7*  AST 447*  ALT 403*  ALKPHOS 300*  BILITOT 0.9   PT/INR Recent Labs    01/02/20 1139  LABPROT 13.6  INR 1.1   Hepatitis Panel Recent Labs    01/02/20 1139  HEPBSAG PENDING  HCVAB NON REACTIVE  HEPAIGM NON REACTIVE  HEPBIGM NON REACTIVE      Studies/Results: CT ABDOMEN PELVIS W CONTRAST  Result Date: 01/02/2020 CLINICAL DATA:  59 year old female with acute abdominal and pelvic pain with fever. History of Crohn's disease and chronic kidney disease. EXAM: CT ABDOMEN AND PELVIS WITH CONTRAST TECHNIQUE: Multidetector CT imaging of the abdomen and pelvis was performed using the standard protocol following bolus administration of intravenous contrast. CONTRAST:  87m OMNIPAQUE IOHEXOL 300 MG/ML  SOLN COMPARISON:  12/08/2019 and prior CTs FINDINGS: Lower chest: A 6 mm RIGHT LOWER lobe nodule (series 4: Image 4) is noted. It is difficult to determine if this area was imaged on prior abdomen/pelvic CTs. Hepatobiliary: The liver is unremarkable. The patient is status post cholecystectomy. Mild CBD and intrahepatic biliary dilatation again noted. Pancreas: Mild pancreatic ductal dilatation is unchanged. No focal hepatic abnormalities noted. Spleen: Mild splenomegaly appears increased. Adrenals/Urinary Tract: The kidneys, adrenal glands and bladder are unremarkable. Stomach/Bowel: Evidence of partial colectomy with ileocolonic anastomosis again noted. Unchanged mildly distended fluid and gas-filled colon within the LEFT abdomen and pelvis with  equivocal mild circumferential wall thickening. No dilated small bowel loops are noted. Little significant changes noted since the prior study. Vascular/Lymphatic: A LEFT femoral catheter is noted. Mild aortic atherosclerotic calcification noted without aneurysm. No definite enlarged nodes identified. Reproductive: Status post hysterectomy. No adnexal masses. Other: No ascites, abscess or pneumoperitoneum. Musculoskeletal: No acute or suspicious bony abnormality. IMPRESSION: 1. Unchanged mildly distended loops of fluid and gas filled colon within the LEFT abdomen and pelvis with equivocal mild circumferential wall thickening. This may represent a colitis. No evidence of small bowel obstruction or pneumoperitoneum. 2. Mild splenomegaly, apparent slightly increased from the prior study. 3. 6 mm RIGHT LOWER lobe nodule. Non-contrast chest CT at 6-12 months is recommended. If the nodule is stable at time of repeat CT, then future CT at 18-24 months (from today's scan) is considered optional for low-risk patients, but is recommended for high-risk patients. This recommendation follows the consensus statement: Guidelines for Management of Incidental Pulmonary Nodules Detected on CT Images: From the Fleischner Society 2017; Radiology 2017; 284:228-243. 4. Aortic Atherosclerosis (ICD10-I70.0). Electronically Signed   By: JMargarette CanadaM.D.   On: 01/02/2020 09:14    IMPRESSION/PLAN:  177 59year old female with Complex Crohn's disease presents to the ED with N/V, diarrhea and lower abdominal pain. CTAP showed mild left sided colitis.  T-max 101F at 2am, afebrile since then. WBC 8.1. -clear liquid diet -pain management per the hospitalist -Zofran IV Q 6 hrs PRN -Check c.diff and GI panel -PPI po QD  2. Elevated LFTs. CTAP with mild CBD and intrahepatic biliary dilatation and mild pancreatic duct dilatation which is unchanged from prior image studies. Questionable hx of ascending cholangitis 12/31-11/30/2019.    -abdominal MRI/MRCP without contrast as patient had CTAP with IV contrast earlier today and creatine level rising 1.21 -->1.63.  -Empiric antibiotic for possible cholangitis not recommended at this time as verified by Dr. MRush Landmark Await  MRCP results.  -CBC, BMP and lipase level in am   3. AKI on CKD.  Further recommendations per Dr. MCorena PilgrimMDorathy Daft 01/02/2020, 2:59 PM

## 2020-01-02 NOTE — ED Triage Notes (Signed)
Pt c/o abd pain with fever. States hx of Crohn's and staph.

## 2020-01-02 NOTE — Progress Notes (Signed)
Patient went to MRI.

## 2020-01-03 DIAGNOSIS — K5090 Crohn's disease, unspecified, without complications: Secondary | ICD-10-CM

## 2020-01-03 DIAGNOSIS — N183 Chronic kidney disease, stage 3 unspecified: Secondary | ICD-10-CM

## 2020-01-03 DIAGNOSIS — K912 Postsurgical malabsorption, not elsewhere classified: Secondary | ICD-10-CM

## 2020-01-03 DIAGNOSIS — R7881 Bacteremia: Secondary | ICD-10-CM

## 2020-01-03 DIAGNOSIS — Z9889 Other specified postprocedural states: Secondary | ICD-10-CM

## 2020-01-03 DIAGNOSIS — K529 Noninfective gastroenteritis and colitis, unspecified: Secondary | ICD-10-CM

## 2020-01-03 DIAGNOSIS — Z95828 Presence of other vascular implants and grafts: Secondary | ICD-10-CM

## 2020-01-03 DIAGNOSIS — R748 Abnormal levels of other serum enzymes: Secondary | ICD-10-CM

## 2020-01-03 DIAGNOSIS — Z8719 Personal history of other diseases of the digestive system: Secondary | ICD-10-CM

## 2020-01-03 DIAGNOSIS — K8689 Other specified diseases of pancreas: Secondary | ICD-10-CM

## 2020-01-03 DIAGNOSIS — Z87891 Personal history of nicotine dependence: Secondary | ICD-10-CM

## 2020-01-03 DIAGNOSIS — Z9989 Dependence on other enabling machines and devices: Secondary | ICD-10-CM

## 2020-01-03 DIAGNOSIS — K5010 Crohn's disease of large intestine without complications: Secondary | ICD-10-CM

## 2020-01-03 DIAGNOSIS — B952 Enterococcus as the cause of diseases classified elsewhere: Secondary | ICD-10-CM

## 2020-01-03 DIAGNOSIS — Z9049 Acquired absence of other specified parts of digestive tract: Secondary | ICD-10-CM

## 2020-01-03 LAB — CBC
HCT: 24.6 % — ABNORMAL LOW (ref 36.0–46.0)
Hemoglobin: 8 g/dL — ABNORMAL LOW (ref 12.0–15.0)
MCH: 30.7 pg (ref 26.0–34.0)
MCHC: 32.5 g/dL (ref 30.0–36.0)
MCV: 94.3 fL (ref 80.0–100.0)
Platelets: 116 10*3/uL — ABNORMAL LOW (ref 150–400)
RBC: 2.61 MIL/uL — ABNORMAL LOW (ref 3.87–5.11)
RDW: 14.6 % (ref 11.5–15.5)
WBC: 8.1 10*3/uL (ref 4.0–10.5)
nRBC: 0 % (ref 0.0–0.2)

## 2020-01-03 LAB — COMPREHENSIVE METABOLIC PANEL
ALT: 224 U/L — ABNORMAL HIGH (ref 0–44)
AST: 100 U/L — ABNORMAL HIGH (ref 15–41)
Albumin: 2.4 g/dL — ABNORMAL LOW (ref 3.5–5.0)
Alkaline Phosphatase: 239 U/L — ABNORMAL HIGH (ref 38–126)
Anion gap: 9 (ref 5–15)
BUN: 35 mg/dL — ABNORMAL HIGH (ref 6–20)
CO2: 23 mmol/L (ref 22–32)
Calcium: 8.8 mg/dL — ABNORMAL LOW (ref 8.9–10.3)
Chloride: 108 mmol/L (ref 98–111)
Creatinine, Ser: 1.66 mg/dL — ABNORMAL HIGH (ref 0.44–1.00)
GFR calc Af Amer: 39 mL/min — ABNORMAL LOW (ref >60)
GFR calc non Af Amer: 34 mL/min — ABNORMAL LOW (ref >60)
Glucose, Bld: 67 mg/dL — ABNORMAL LOW (ref 70–99)
Potassium: 4 mmol/L (ref 3.5–5.1)
Sodium: 140 mmol/L (ref 135–145)
Total Bilirubin: 0.8 mg/dL (ref 0.3–1.2)
Total Protein: 5.6 g/dL — ABNORMAL LOW (ref 6.5–8.1)

## 2020-01-03 LAB — DIFFERENTIAL
Abs Immature Granulocytes: 0.05 10*3/uL (ref 0.00–0.07)
Basophils Absolute: 0 10*3/uL (ref 0.0–0.1)
Basophils Relative: 0 %
Eosinophils Absolute: 0 10*3/uL (ref 0.0–0.5)
Eosinophils Relative: 0 %
Immature Granulocytes: 1 %
Lymphocytes Relative: 10 %
Lymphs Abs: 0.8 10*3/uL (ref 0.7–4.0)
Monocytes Absolute: 0.6 10*3/uL (ref 0.1–1.0)
Monocytes Relative: 7 %
Neutro Abs: 6.6 10*3/uL (ref 1.7–7.7)
Neutrophils Relative %: 82 %

## 2020-01-03 LAB — C DIFFICILE QUICK SCREEN W PCR REFLEX
C Diff antigen: NEGATIVE
C Diff interpretation: NOT DETECTED
C Diff toxin: NEGATIVE

## 2020-01-03 LAB — MAGNESIUM: Magnesium: 1.7 mg/dL (ref 1.7–2.4)

## 2020-01-03 LAB — PREALBUMIN: Prealbumin: 22.2 mg/dL (ref 18–38)

## 2020-01-03 LAB — PHOSPHORUS: Phosphorus: 3.8 mg/dL (ref 2.5–4.6)

## 2020-01-03 LAB — IGG: IgG (Immunoglobin G), Serum: 1059 mg/dL (ref 586–1602)

## 2020-01-03 LAB — CMV IGM: CMV IgM: 46.8 AU/mL — ABNORMAL HIGH (ref 0.0–29.9)

## 2020-01-03 LAB — TRIGLYCERIDES: Triglycerides: 298 mg/dL — ABNORMAL HIGH (ref <150)

## 2020-01-03 LAB — LIPASE, BLOOD: Lipase: 13 U/L (ref 11–51)

## 2020-01-03 MED ORDER — ALTEPLASE 2 MG IJ SOLR
2.0000 mg | Freq: Once | INTRAMUSCULAR | Status: AC
  Administered 2020-01-03: 2 mg
  Filled 2020-01-03: qty 2

## 2020-01-03 NOTE — Consult Note (Signed)
[facility] for Infectious Disease    Date of Admission:  01/02/2020   Total days of antibiotics: 1 zosyn               Reason for Consult: Enterobacter bacteremia    Referring Provider: Erlinda Hong   Assessment: Enterococcal bacteremia Crohn's  TPN dependent L sided colitis Diarrhea (C diff -) CKD 3  Plan: 1. Continue zosyn 2. Repeat BCx.  3. Await Caitlin Peters BCx sensi from current isolate.  4. Hold on removing Caitlin Peters PIC at this time.   Comment Very difficult case as she has recurrent bacteremia in face of retained line. Would seem this needs to be pulled. Balancing this against Caitlin Peters lack of access sites (she has venous distension in Caitlin Peters neck which she attributes to Caitlin Peters prev lines there) is difficult. As well, this BCx and Caitlin Peters prev BCx were both done in the face of active hepatitis and colitis.   Thank you so much for this interesting consult,  Principal Problem:   Crohn's colitis ([place]) Active Problems:   Fever   Short gut syndrome   HTN (hypertension)   GERD (gastroesophageal reflux disease)   Chronic pain syndrome   Acute kidney injury superimposed on chronic kidney disease ([name])   On total parenteral nutrition (TPN)   Elevated liver enzymes   . acidophilus  1 capsule Oral Daily  . amLODipine  5 mg Oral Daily  . budesonide  9 mg Oral Daily  . buPROPion  200 mg Oral Daily  . Chlorhexidine Gluconate Cloth  6 each Topical Daily  . cycloSPORINE  1 drop Both Eyes BID  . dicyclomine  20 mg Oral TID  . DULoxetine  90 mg Oral Daily  . enoxaparin (LOVENOX) injection  40 mg Subcutaneous Q24H  . estradiol  2 mg Oral Daily  . famotidine  20 mg Oral QHS  . hydroxypropyl methylcellulose / hypromellose  1 drop Both Eyes QID  . lipase/protease/amylase  108,000 Units Oral TID AC  . metoprolol tartrate  100 mg Oral BID  . pantoprazole  40 mg Oral Daily  . potassium chloride SA  40 mEq Oral Daily  . sodium chloride flush  3 mL Intravenous Q12H  . sucralfate  1 g Oral TID WC &  HS    HPI: Caitlin Peters is a 59 y.o. female with hx of Crohn's disease, short gut (multiple previous surgeries, SBO), and TPN dependence (since 2003). She came to ED on 2-6 with diarrhea that she felt was consistent with Caitlin Peters Crohn's disease. She also had fever which was not improving with tylenol.  In ED she was febrile to 101, WBC 8.1. She had a CT scan that showed bliary dilatation post cholecystectomy and possible colitis.   Caitlin Peters most recent hospital stay was 12-30 to 1-4 with pancolitis, hepatitis, cholangitis and enterobacter bacteremia.  She has a L femoral line in place since October 2020.   Caitlin Peters BCx were noted on 2-6 to have E cloacae (non-esbl). 4/4. She was started on zosyn.  She underwent MRCP today notable for biliary and pancreatic ductal dilatation (improved vs 12-30). Caitlin Peters LFTs have improved from adm.  Hepatic Function Latest Ref Rng & Units 01/03/2020 01/02/2020 12/14/2019  Total Protein 6.5 - 8.1 g/dL 5.6(L) 6.3(L) 7.3  Albumin 3.5 - 5.0 g/dL 2.4(L) 2.7(L) 3.6  AST 15 - 41 U/L 100(H) 447(H) 27  ALT 0 - 44 U/L 224(H) 403(H) 22  Alk Phosphatase 38 - 126 U/L 239(H)  300(H) 125  Total Bilirubin 0.3 - 1.2 mg/dL 0.8 0.9 0.7  Bilirubin, Direct 0.0 - 0.2 mg/dL - - -      Review of Systems: Review of Systems  Constitutional: Positive for chills, fever, malaise/fatigue and weight loss.  HENT:       No oral sores or thrush.   Respiratory: Negative for cough and shortness of breath.   Gastrointestinal: Positive for abdominal pain, diarrhea, nausea and vomiting.  Genitourinary: Negative for dysuria.  Please see HPI. All other systems reviewed and negative.   Past Medical History:  Diagnosis Date  . Anasarca 10/2019  . AVN (avascular necrosis of bone) ([place])   . Cataract   . Chronic pain syndrome   . CKD (chronic kidney disease), stage III   . Crohn disease ([place])   . Crohn disease ([place])   . Depression   . Diverticulosis   . GERD (gastroesophageal reflux disease)   . HTN  (hypertension)   . IDA (iron deficiency anemia)   . Malnutrition ([place])   . Mass in chest   . Osteoporosis   . Pancreatitis   . Short gut syndrome   . Vitamin B12 deficiency     Social History   Tobacco Use  . Smoking status: Former Research scientist (life sciences)  . Smokeless tobacco: Never Used  Substance Use Topics  . Alcohol use: Not Currently  . Drug use: Never    Family History  Problem Relation Age of Onset  . Breast cancer Sister   . Multiple sclerosis Sister   . Diabetes Sister   . Lupus Sister   . Colon cancer Other   . Crohn's disease Other   . Seizures Mother   . Glaucoma Mother   . CAD Father   . Heart disease Father   . Hypertension Father      Medications:  Scheduled: . acidophilus  1 capsule Oral Daily  . amLODipine  5 mg Oral Daily  . budesonide  9 mg Oral Daily  . buPROPion  200 mg Oral Daily  . Chlorhexidine Gluconate Cloth  6 each Topical Daily  . cycloSPORINE  1 drop Both Eyes BID  . dicyclomine  20 mg Oral TID  . DULoxetine  90 mg Oral Daily  . enoxaparin (LOVENOX) injection  40 mg Subcutaneous Q24H  . estradiol  2 mg Oral Daily  . famotidine  20 mg Oral QHS  . hydroxypropyl methylcellulose / hypromellose  1 drop Both Eyes QID  . lipase/protease/amylase  108,000 Units Oral TID AC  . metoprolol tartrate  100 mg Oral BID  . pantoprazole  40 mg Oral Daily  . potassium chloride SA  40 mEq Oral Daily  . sodium chloride flush  3 mL Intravenous Q12H  . sucralfate  1 g Oral TID WC & HS    Abtx:  Anti-infectives (From admission, onward)   Start     Dose/Rate Route Frequency Ordered Stop   01/02/20 2100  ceFEPIme (MAXIPIME) 2 g in sodium chloride 0.9 % 100 mL IVPB  Status:  Discontinued     2 g 200 mL/hr over 30 Minutes Intravenous Every 8 hours 01/02/20 2032 01/02/20 2057   01/02/20 2100  piperacillin-tazobactam (ZOSYN) IVPB 3.375 g     3.375 g 12.5 mL/hr over 240 Minutes Intravenous Every 8 hours 01/02/20 2057        The past medical history, family history  and social history were reviewed/updated in EPIC   OBJECTIVE: Blood pressure (!) 143/75, pulse 66, temperature 98.8 F (37.1  C), temperature source Oral, resp. rate 16, height 5' 8"  (1.727 m), weight 62.4 kg, SpO2 100 %.  Physical Exam Constitutional:      General: She is not in acute distress.    Appearance: Normal appearance. She is not ill-appearing or toxic-appearing.  HENT:     Mouth/Throat:     Mouth: Mucous membranes are moist.     Pharynx: No oropharyngeal exudate.  Neck:     Comments: Venous distension.  Cardiovascular:     Rate and Rhythm: Normal rate and regular rhythm.  Pulmonary:     Effort: Pulmonary effort is normal.     Breath sounds: Normal breath sounds.  Abdominal:     General: Bowel sounds are normal. There is no distension.     Palpations: Abdomen is soft.     Tenderness: There is abdominal tenderness (band like, across lower abd. ). There is guarding.  Musculoskeletal:        General: No swelling.     Cervical back: Normal range of motion and neck supple.     Right lower leg: No edema.     Left lower leg: No edema.  Skin:      Neurological:     Mental Status: She is alert.  Psychiatric:        Mood and Affect: Mood normal.     Lab Results Results for orders placed or performed during the hospital encounter of 01/02/20 (from the past 48 hour(s))  Urinalysis, Routine w reflex microscopic     Status: Abnormal   Collection Time: 01/02/20  2:20 AM  Result Value Ref Range   Color, Urine YELLOW YELLOW   APPearance CLEAR CLEAR   Specific Gravity, Urine 1.017 1.005 - 1.030   pH 6.0 5.0 - 8.0   Glucose, UA NEGATIVE NEGATIVE mg/dL   Hgb urine dipstick NEGATIVE NEGATIVE   Bilirubin Urine NEGATIVE NEGATIVE   Ketones, ur NEGATIVE NEGATIVE mg/dL   Protein, ur 30 (A) NEGATIVE mg/dL   Nitrite NEGATIVE NEGATIVE   Leukocytes,Ua NEGATIVE NEGATIVE   RBC / HPF 0-5 0 - 5 RBC/hpf   WBC, UA 0-5 0 - 5 WBC/hpf   Bacteria, UA NONE SEEN NONE SEEN   Squamous  Epithelial / LPF 0-5 0 - 5    Comment: Performed at [facility], [place] [address], [place], [place] [address]  Lipase, blood     Status: None   Collection Time: 01/02/20  2:31 AM  Result Value Ref Range   Lipase 16 11 - 51 U/L    Comment: Performed at [facility], [place] [address] [place], [place] [address]  Comprehensive metabolic panel     Status: Abnormal   Collection Time: 01/02/20  2:31 AM  Result Value Ref Range   Sodium 137 135 - 145 mmol/L   Potassium 3.5 3.5 - 5.1 mmol/L   Chloride 102 98 - 111 mmol/L   CO2 24 22 - 32 mmol/L   Glucose, Bld 102 (H) 70 - 99 mg/dL   BUN 46 (H) 6 - 20 mg/dL   Creatinine, Ser 1.63 (H) 0.44 - 1.00 mg/dL   Calcium 9.0 8.9 - 10.3 mg/dL   Total Protein 6.3 (L) 6.5 - 8.1 g/dL   Albumin 2.7 (L) 3.5 - 5.0 g/dL   AST 447 (H) 15 - 41 U/L   ALT 403 (H) 0 - 44 U/L   Alkaline Phosphatase 300 (H) 38 - 126 U/L   Total Bilirubin 0.9 0.3 - 1.2 mg/dL  GFR calc non Af Amer 34 (L) >60 mL/min   GFR calc Af Amer 40 (L) >60 mL/min   Anion gap 11 5 - 15    Comment: Performed at [facility] [address] [place], [place] [address]  CBC     Status: Abnormal   Collection Time: 01/02/20  2:31 AM  Result Value Ref Range   WBC 8.1 4.0 - 10.5 K/uL   RBC 2.82 (L) 3.87 - 5.11 MIL/uL   Hemoglobin 8.5 (L) 12.0 - 15.0 g/dL   HCT 26.3 (L) 36.0 - 46.0 %   MCV 93.3 80.0 - 100.0 fL   MCH 30.1 26.0 - 34.0 pg   MCHC 32.3 30.0 - 36.0 g/dL   RDW 14.5 11.5 - 15.5 %   Platelets 191 150 - 400 K/uL   nRBC 0.0 0.0 - 0.2 %    Comment: Performed at [facility], [place] [address] [place], [place] [address]  CBC with Differential/Platelet     Status: Abnormal   Collection Time: 01/02/20  2:31 AM  Result Value Ref Range   WBC 8.1 4.0 - 10.5 K/uL   RBC 2.76 (L) 3.87 - 5.11 MIL/uL   Hemoglobin 8.4 (L) 12.0 - 15.0 g/dL   HCT 26.0 (L) 36.0 - 46.0 %   MCV 94.2 80.0 - 100.0 fL   MCH 30.4 26.0 - 34.0 pg   MCHC 32.3 30.0 - 36.0 g/dL   RDW 14.7 11.5 - 15.5 %    Platelets 190 150 - 400 K/uL   nRBC 0.0 0.0 - 0.2 %   Neutrophils Relative % 94 %   Neutro Abs 7.6 1.7 - 7.7 K/uL   Lymphocytes Relative 4 %   Lymphs Abs 0.3 (L) 0.7 - 4.0 K/uL   Monocytes Relative 1 %   Monocytes Absolute 0.1 0.1 - 1.0 K/uL   Eosinophils Relative 0 %   Eosinophils Absolute 0.0 0.0 - 0.5 K/uL   Basophils Relative 0 %   Basophils Absolute 0.0 0.0 - 0.1 K/uL   Immature Granulocytes 1 %   Abs Immature Granulocytes 0.04 0.00 - 0.07 K/uL    Comment: Performed at [facility], [place] [address], [place], [place] [address]  Blood culture (routine x 2)     Status: None (Preliminary result)   Collection Time: 01/02/20  5:30 AM   Specimen: BLOOD  Result Value Ref Range   Specimen Description BLOOD RIGHT ANTECUBITAL    Special Requests      BOTTLES DRAWN AEROBIC AND ANAEROBIC Blood Culture results may not be optimal due to an inadequate volume of blood received in culture bottles   Culture  Setup Time      GRAM NEGATIVE RODS IN BOTH AEROBIC AND ANAEROBIC BOTTLES CRITICAL RESULT CALLED TO, READ BACK BY AND VERIFIED WITH: PHARMD LYDIA C. [id number] FCP    Culture      GRAM NEGATIVE RODS CULTURE REINCUBATED FOR BETTER GROWTH Performed at [facility], [place] [address] [place], [place] [address]    Report Status PENDING   Blood culture (routine x 2)     Status: None (Preliminary result)   Collection Time: 01/02/20  5:30 AM   Specimen: BLOOD  Result Value Ref Range   Specimen Description BLOOD LEFT ANTECUBITAL    Special Requests      BOTTLES DRAWN AEROBIC AND ANAEROBIC Blood Culture adequate volume   Culture  Setup Time      GRAM NEGATIVE RODS IN BOTH AEROBIC  AND ANAEROBIC BOTTLES    Culture      GRAM NEGATIVE RODS CULTURE REINCUBATED FOR BETTER GROWTH Performed at [facility], [place] [address] [place], [place] [address]    Report Status PENDING   Blood Culture ID Panel (Reflexed)     Status: Abnormal   Collection Time: 01/02/20  5:30 AM  Result  Value Ref Range   Enterococcus species NOT DETECTED NOT DETECTED   Listeria monocytogenes NOT DETECTED NOT DETECTED   Staphylococcus species NOT DETECTED NOT DETECTED   Staphylococcus aureus (BCID) NOT DETECTED NOT DETECTED   Streptococcus species NOT DETECTED NOT DETECTED   Streptococcus agalactiae NOT DETECTED NOT DETECTED   Streptococcus pneumoniae NOT DETECTED NOT DETECTED   Streptococcus pyogenes NOT DETECTED NOT DETECTED   Acinetobacter baumannii NOT DETECTED NOT DETECTED   Enterobacteriaceae species DETECTED (A) NOT DETECTED    Comment: Enterobacteriaceae represent a large family of gram-negative bacteria, not a single organism. CRITICAL RESULT CALLED TO, READ BACK BY AND VERIFIED WITH: PHARMD LYDIA C. [id number] FCP    Enterobacter cloacae complex DETECTED (A) NOT DETECTED    Comment: CRITICAL RESULT CALLED TO, READ BACK BY AND VERIFIED WITH: PHARMD LYDIA C. [phone] FCP    Escherichia coli NOT DETECTED NOT DETECTED   Klebsiella oxytoca NOT DETECTED NOT DETECTED   Klebsiella pneumoniae NOT DETECTED NOT DETECTED   Proteus species NOT DETECTED NOT DETECTED   Serratia marcescens NOT DETECTED NOT DETECTED   Carbapenem resistance NOT DETECTED NOT DETECTED   Haemophilus influenzae NOT DETECTED NOT DETECTED   Neisseria meningitidis NOT DETECTED NOT DETECTED   Pseudomonas aeruginosa NOT DETECTED NOT DETECTED   Candida albicans NOT DETECTED NOT DETECTED   Candida glabrata NOT DETECTED NOT DETECTED   Candida krusei NOT DETECTED NOT DETECTED   Candida parapsilosis NOT DETECTED NOT DETECTED   Candida tropicalis NOT DETECTED NOT DETECTED    Comment: Performed at [facility] [address] [place], [place] [address]  Lactic acid, plasma     Status: None   Collection Time: 01/02/20  7:34 AM  Result Value Ref Range   Lactic Acid, Venous 0.5 0.5 - 1.9 mmol/L    Comment: Performed at [facility] [address] [place], [place] [address]  Novel Coronavirus, NAA (Hosp  order, Send-out to Ref Lab; TAT 18-24 hrs     Status: None   Collection Time: 01/02/20  7:48 AM   Specimen: Nasopharyngeal Swab; Respiratory  Result Value Ref Range   SARS-CoV-2, NAA NOT DETECTED NOT DETECTED    Comment: (NOTE) This nucleic acid amplification test was developed and its performance characteristics determined by [employer]. Nucleic acid amplification tests include RT-PCR and TMA. This test has not been FDA cleared or approved. This test has been authorized by FDA under an Emergency Use Authorization (EUA). This test is only authorized for the duration of time the declaration that circumstances exist justifying the authorization of the emergency use of in vitro diagnostic tests for detection of SARS-CoV-2 virus and/or diagnosis of COVID-19 infection under section 564(b)(1) of the Act, 21 U.S.C. 322GUR-4(Y) (1), unless the authorization is terminated or revoked sooner. When diagnostic testing is negative, the possibility of a false negative result should be considered in the context of a patient's recent exposures and the presence of clinical signs and symptoms consistent with COVID-19. An individual without symptoms of COVID- 19 and who is not shedding SARS-CoV-2  virus would expect to have a negative (not detected) result in  this assay. Performed At: [facility] [place], [place] [id number] Rush Farmer MD [id number]    Coronavirus Source NASOPHARYNGEAL     Comment: Performed at [facility], [place] [address] [place], [place] [address]  IgG     Status: None   Collection Time: 01/02/20 11:39 AM  Result Value Ref Range   IgG (Immunoglobin G), Serum 1,059 586 - 1,602 mg/dL    Comment: (NOTE) Performed At: [facility] [place], [place] [id number] Rush Farmer MD [id number]   Hepatitis panel, acute     Status: None   Collection Time: 01/02/20 11:39 AM  Result Value Ref Range   Hepatitis B Surface  Ag NON REACTIVE NON REACTIVE   HCV Ab NON REACTIVE NON REACTIVE    Comment: (NOTE) Nonreactive HCV antibody screen is consistent with no HCV infections,  unless recent infection is suspected or other evidence exists to indicate HCV infection.    Hep A IgM NON REACTIVE NON REACTIVE   Hep B C IgM NON REACTIVE NON REACTIVE    Comment: Performed at [facility], [place] [address] [place], [place] [address]  CMV IgM     Status: Abnormal   Collection Time: 01/02/20 11:39 AM  Result Value Ref Range   CMV IgM 46.8 (H) 0.0 - 29.9 AU/mL    Comment: (NOTE)                                Negative         <30.0                                Equivocal  30.0 - 34.9                                Positive         >34.9 A positive result is generally indicative of acute infection, reactivation or persistent IgM production. Performed At: [facility] [place], [place] [id number] Rush Farmer MD [id number]   Protime-INR     Status: None   Collection Time: 01/02/20 11:39 AM  Result Value Ref Range   Prothrombin Time 13.6 11.4 - 15.2 seconds   INR 1.1 0.8 - 1.2    Comment: (NOTE) INR goal varies based on device and disease states. Performed at [facility], [place] [address], [place], [place] [address]   C difficile quick scan w PCR reflex     Status: None   Collection Time: 01/03/20 12:11 AM   Specimen: STOOL  Result Value Ref Range   C Diff antigen NEGATIVE NEGATIVE   C Diff toxin NEGATIVE NEGATIVE   C Diff interpretation No C. difficile detected.     Comment: Performed at [facility], [place] [address] [place],  [address]  Comprehensive metabolic panel     Status: Abnormal   Collection Time: 01/03/20  3:17 AM  Result Value Ref Range   Sodium 140 135 - 145 mmol/L   Potassium 4.0 3.5 - 5.1 mmol/L   Chloride 108 98 - 111 mmol/L   CO2 23 22 - 32 mmol/L   Glucose, Bld 67 (L) 70 - 99 mg/dL   BUN 35 (H) 6 - 20 mg/dL   Creatinine, Ser 1.66 (H)  0.44 - 1.00 mg/dL   Calcium 8.8 (L) 8.9 - 10.3 mg/dL   Total Protein 5.6 (L) 6.5 - 8.1 g/dL   Albumin 2.4 (L) 3.5 - 5.0 g/dL   AST 100 (H) 15 - 41 U/L   ALT 224 (H) 0 - 44 U/L   Alkaline Phosphatase 239 (H) 38 - 126 U/L   Total Bilirubin 0.8 0.3 - 1.2 mg/dL   GFR calc non Af Amer 34 (L) >60 mL/min   GFR calc Af Amer 39 (L) >60 mL/min   Anion gap 9 5 - 15    Comment: Performed at [facility] [address] [place], [place] [address]  CBC     Status: Abnormal   Collection Time: 01/03/20  3:17 AM  Result Value Ref Range   WBC 8.1 4.0 - 10.5 K/uL   RBC 2.61 (L) 3.87 - 5.11 MIL/uL   Hemoglobin 8.0 (L) 12.0 - 15.0 g/dL   HCT 24.6 (L) 36.0 - 46.0 %   MCV 94.3 80.0 - 100.0 fL   MCH 30.7 26.0 - 34.0 pg   MCHC 32.5 30.0 - 36.0 g/dL   RDW 14.6 11.5 - 15.5 %   Platelets 116 (L) 150 - 400 K/uL    Comment: REPEATED TO VERIFY PLATELET COUNT CONFIRMED BY SMEAR    nRBC 0.0 0.0 - 0.2 %    Comment: Performed at [facility], [place] [address], [place], [place] [address]  Prealbumin     Status: None   Collection Time: 01/03/20  3:17 AM  Result Value Ref Range   Prealbumin 22.2 18 - 38 mg/dL    Comment: Performed at [facility] [address], [place], [place] [address]  Magnesium     Status: None   Collection Time: 01/03/20  3:17 AM  Result Value Ref Range   Magnesium 1.7 1.7 - 2.4 mg/dL    Comment: Performed at [facility] [address] [place], [place] [address]  Phosphorus     Status: None   Collection Time: 01/03/20  3:17 AM  Result Value Ref Range   Phosphorus 3.8 2.5 - 4.6 mg/dL    Comment: Performed at [facility] [address], [place], [place] [address]  Triglycerides     Status: Abnormal   Collection Time: 01/03/20  3:17 AM  Result Value Ref Range   Triglycerides 298 (H) <150 mg/dL    Comment: Performed at [facility] [address], [place], [place] [address]  Differential     Status: None   Collection Time: 01/03/20  3:17 AM    Result Value Ref Range   Neutrophils Relative % 82 %   Neutro Abs 6.6 1.7 - 7.7 K/uL   Lymphocytes Relative 10 %   Lymphs Abs 0.8 0.7 - 4.0 K/uL   Monocytes Relative 7 %   Monocytes Absolute 0.6 0.1 - 1.0 K/uL   Eosinophils Relative 0 %   Eosinophils Absolute 0.0 0.0 - 0.5 K/uL   Basophils Relative 0 %   Basophils Absolute 0.0 0.0 - 0.1 K/uL   Immature Granulocytes 1 %   Abs Immature Granulocytes 0.05 0.00 - 0.07 K/uL   Ovalocytes PRESENT     Comment: Performed at [facility], [place] [address] [place], [place] [address]  Lipase, blood     Status: None   Collection Time: 01/03/20  3:17 AM  Result Value Ref Range   Lipase 13 11 - 51 U/L  Comment: Performed at [facility], [place] [address], [place], [place] [address]      Component Value Date/Time   SDES BLOOD RIGHT ANTECUBITAL 01/02/2020 0530   SDES BLOOD LEFT ANTECUBITAL 01/02/2020 0530   SPECREQUEST  01/02/2020 0530    BOTTLES DRAWN AEROBIC AND ANAEROBIC Blood Culture results may not be optimal due to an inadequate volume of blood received in culture bottles   SPECREQUEST  01/02/2020 0530    BOTTLES DRAWN AEROBIC AND ANAEROBIC Blood Culture adequate volume   CULT  01/02/2020 0530    GRAM NEGATIVE RODS CULTURE REINCUBATED FOR BETTER GROWTH Performed at [facility], [place] [address] [place], [place] [address]    CULT  01/02/2020 0530    GRAM NEGATIVE RODS CULTURE REINCUBATED FOR BETTER GROWTH Performed at [facility], [place] [address] [place], [place] [address]    REPTSTATUS PENDING 01/02/2020 0530   REPTSTATUS PENDING 01/02/2020 0530   CT ABDOMEN PELVIS W CONTRAST  Result Date: 01/02/2020 CLINICAL DATA:  59 year old female with acute abdominal and pelvic pain with fever. History of Crohn's disease and chronic kidney disease. EXAM: CT ABDOMEN AND PELVIS WITH CONTRAST TECHNIQUE: Multidetector CT imaging of the abdomen and pelvis was performed using the standard protocol following bolus administration  of intravenous contrast. CONTRAST:  74m OMNIPAQUE IOHEXOL 300 MG/ML  SOLN COMPARISON:  12/08/2019 and prior CTs FINDINGS: Lower chest: A 6 mm RIGHT LOWER lobe nodule (series 4: Image 4) is noted. It is difficult to determine if this area was imaged on prior abdomen/pelvic CTs. Hepatobiliary: The liver is unremarkable. The patient is status post cholecystectomy. Mild CBD and intrahepatic biliary dilatation again noted. Pancreas: Mild pancreatic ductal dilatation is unchanged. No focal hepatic abnormalities noted. Spleen: Mild splenomegaly appears increased. Adrenals/Urinary Tract: The kidneys, adrenal glands and bladder are unremarkable. Stomach/Bowel: Evidence of partial colectomy with ileocolonic anastomosis again noted. Unchanged mildly distended fluid and gas-filled colon within the LEFT abdomen and pelvis with equivocal mild circumferential wall thickening. No dilated small bowel loops are noted. Little significant changes noted since the prior study. Vascular/Lymphatic: A LEFT femoral catheter is noted. Mild aortic atherosclerotic calcification noted without aneurysm. No definite enlarged nodes identified. Reproductive: Status post hysterectomy. No adnexal masses. Other: No ascites, abscess or pneumoperitoneum. Musculoskeletal: No acute or suspicious bony abnormality. IMPRESSION: 1. Unchanged mildly distended loops of fluid and gas filled colon within the LEFT abdomen and pelvis with equivocal mild circumferential wall thickening. This may represent a colitis. No evidence of small bowel obstruction or pneumoperitoneum. 2. Mild splenomegaly, apparent slightly increased from the prior study. 3. 6 mm RIGHT LOWER lobe nodule. Non-contrast chest CT at 6-12 months is recommended. If the nodule is stable at time of repeat CT, then future CT at 18-24 months (from today's scan) is considered optional for low-risk patients, but is recommended for high-risk patients. This recommendation follows the consensus statement:  Guidelines for Management of Incidental Pulmonary Nodules Detected on CT Images: From the Fleischner Society 2017; Radiology 2017; 284:228-243. 4. Aortic Atherosclerosis (ICD10-I70.0). Electronically Signed   By: JMargarette CanadaM.D.   On: 01/02/2020 09:14   MR ABDOMEN MRCP WO CONTRAST  Result Date: 01/03/2020 CLINICAL DATA:  Elevated liver enzymes.  Mild biliary distention. EXAM: MRI ABDOMEN WITHOUT CONTRAST  (INCLUDING MRCP) TECHNIQUE: Multiplanar multisequence MR imaging of the abdomen was performed. Heavily T2-weighted images of the biliary and pancreatic ducts were obtained, and three-dimensional MRCP images were rendered by post processing. COMPARISON:  CT scan 01/02/2020.  CT  scan 11/25/2019. FINDINGS: Lower chest: Heart size upper normal. Hepatobiliary: Liver measures 20 cm craniocaudal length. No focal abnormality in the liver parenchyma on this noncontrast study. Gallbladder surgically absent. Only minimal prominence of the intrahepatic bile ducts with extrahepatic common duct measuring 9 mm diameter in the hepatoduodenal ligament. Common bile duct measures 10 mm in the head of the pancreas. Overall, the intra and extrahepatic biliary ducts are decreased in distention when comparing back to the CT of 11/25/2019. The common bile duct was 14 mm in diameter when remeasured in the same dimension at the same level on that study. No evidence for choledocholithiasis. Pancreas: As before, the main pancreatic duct is dilated throughout its length from the tail to the ampulla. Dilatation measures in the 5-6 mm range, also decrease since 11/25/2019 when I remeasure at a similar level at 8 mm. No focal mass lesion noted within the pancreatic parenchyma on this noncontrast motion degraded study. Spleen: Upper normal at 12.5 cm craniocaudal length. No focal abnormality. Adrenals/Urinary Tract: No adrenal nodule or mass. Kidneys unremarkable. Stomach/Bowel: Stomach is nondistended. No obvious small bowel dilatation. Colon  in the left abdomen is distended, better demonstrated on recent CT scan. Vascular/Lymphatic: No abdominal aortic aneurysm. No bulky abdominal lymphadenopathy evident. Other:  No intraperitoneal free fluid. Musculoskeletal: No abnormal marrow signal within the visualized bony anatomy. IMPRESSION: 1. Motion degraded study, obscuring fine detail. 2. Mild intra and extrahepatic biliary duct dilatation to the level of the ampulla. The degree of distention has decreased when comparing to CT scan of 11/25/2019. No choledocholithiasis. No obstructing mass lesion evident on this noncontrast motion degraded study. 3. Diffuse dilatation of the main pancreatic duct throughout its length again noted. Distention has also decreased when comparing to 11/25/2019. Pancreatic duct distention extends from the tail to the ampulla. No intraluminal stone or obstructing lesion evident. 4. Hepatomegaly Electronically Signed   By: Misty Stanley M.D.   On: 01/03/2020 09:20   Recent Results (from the past 240 hour(s))  Blood culture (routine x 2)     Status: None (Preliminary result)   Collection Time: 01/02/20  5:30 AM   Specimen: BLOOD  Result Value Ref Range Status   Specimen Description BLOOD RIGHT ANTECUBITAL  Final   Special Requests   Final    BOTTLES DRAWN AEROBIC AND ANAEROBIC Blood Culture results may not be optimal due to an inadequate volume of blood received in culture bottles   Culture  Setup Time   Final    GRAM NEGATIVE RODS IN BOTH AEROBIC AND ANAEROBIC BOTTLES CRITICAL RESULT CALLED TO, READ BACK BY AND VERIFIED WITH: PHARMD LYDIA C. [id number] FCP    Culture   Final    GRAM NEGATIVE RODS CULTURE REINCUBATED FOR BETTER GROWTH Performed at [facility], [place] [address] [place],  [address]    Report Status PENDING  Incomplete  Blood culture (routine x 2)     Status: None (Preliminary result)   Collection Time: 01/02/20  5:30 AM   Specimen: BLOOD  Result Value Ref Range Status   Specimen  Description BLOOD LEFT ANTECUBITAL  Final   Special Requests   Final    BOTTLES DRAWN AEROBIC AND ANAEROBIC Blood Culture adequate volume   Culture  Setup Time   Final    GRAM NEGATIVE RODS IN BOTH AEROBIC AND ANAEROBIC BOTTLES    Culture   Final    GRAM NEGATIVE RODS CULTURE REINCUBATED FOR BETTER GROWTH Performed at [facility], [place]  [address], [place], [place] [address]    Report Status PENDING  Incomplete  Blood Culture ID Panel (Reflexed)     Status: Abnormal   Collection Time: 01/02/20  5:30 AM  Result Value Ref Range Status   Enterococcus species NOT DETECTED NOT DETECTED Final   Listeria monocytogenes NOT DETECTED NOT DETECTED Final   Staphylococcus species NOT DETECTED NOT DETECTED Final   Staphylococcus aureus (BCID) NOT DETECTED NOT DETECTED Final   Streptococcus species NOT DETECTED NOT DETECTED Final   Streptococcus agalactiae NOT DETECTED NOT DETECTED Final   Streptococcus pneumoniae NOT DETECTED NOT DETECTED Final   Streptococcus pyogenes NOT DETECTED NOT DETECTED Final   Acinetobacter baumannii NOT DETECTED NOT DETECTED Final   Enterobacteriaceae species DETECTED (A) NOT DETECTED Final    Comment: Enterobacteriaceae represent a large family of gram-negative bacteria, not a single organism. CRITICAL RESULT CALLED TO, READ BACK BY AND VERIFIED WITH: PHARMD LYDIA C. [phone] FCP    Enterobacter cloacae complex DETECTED (A) NOT DETECTED Final    Comment: CRITICAL RESULT CALLED TO, READ BACK BY AND VERIFIED WITH: PHARMD LYDIA C. [phone] FCP    Escherichia coli NOT DETECTED NOT DETECTED Final   Klebsiella oxytoca NOT DETECTED NOT DETECTED Final   Klebsiella pneumoniae NOT DETECTED NOT DETECTED Final   Proteus species NOT DETECTED NOT DETECTED Final   Serratia marcescens NOT DETECTED NOT DETECTED Final   Carbapenem resistance NOT DETECTED NOT DETECTED Final   Haemophilus influenzae NOT DETECTED NOT DETECTED Final   Neisseria meningitidis NOT DETECTED  NOT DETECTED Final   Pseudomonas aeruginosa NOT DETECTED NOT DETECTED Final   Candida albicans NOT DETECTED NOT DETECTED Final   Candida glabrata NOT DETECTED NOT DETECTED Final   Candida krusei NOT DETECTED NOT DETECTED Final   Candida parapsilosis NOT DETECTED NOT DETECTED Final   Candida tropicalis NOT DETECTED NOT DETECTED Final    Comment: Performed at [facility], [place] [address] [place], [place] [address]  Novel Coronavirus, NAA (Hosp order, Send-out to Ref Lab; TAT 18-24 hrs     Status: None   Collection Time: 01/02/20  7:48 AM   Specimen: Nasopharyngeal Swab; Respiratory  Result Value Ref Range Status   SARS-CoV-2, NAA NOT DETECTED NOT DETECTED Final    Comment: (NOTE) This nucleic acid amplification test was developed and its performance characteristics determined by [employer]. Nucleic acid amplification tests include RT-PCR and TMA. This test has not been FDA cleared or approved. This test has been authorized by FDA under an Emergency Use Authorization (EUA). This test is only authorized for the duration of time the declaration that circumstances exist justifying the authorization of the emergency use of in vitro diagnostic tests for detection of SARS-CoV-2 virus and/or diagnosis of COVID-19 infection under section 564(b)(1) of the Act, 21 U.S.C. 333LKT-6(Y) (1), unless the authorization is terminated or revoked sooner. When diagnostic testing is negative, the possibility of a false negative result should be considered in the context of a patient's recent exposures and the presence of clinical signs and symptoms consistent with COVID-19. An individual without symptoms of COVID- 19 and who is not shedding SARS-CoV-2  virus would expect to have a negative (not detected) result in this assay. Performed At: [facility] [address] [place], [place] [id number] Rush Farmer MD KA:[id number]    [place]  Final    Comment:  Performed at [facility], [place] [address] [place], [place] [address]  C difficile quick scan w PCR reflex  Status: None   Collection Time: 01/03/20 12:11 AM   Specimen: STOOL  Result Value Ref Range Status   C Diff antigen NEGATIVE NEGATIVE Final   C Diff toxin NEGATIVE NEGATIVE Final   C Diff interpretation No C. difficile detected.  Final    Comment: Performed at [facility], [name] [address], [place], [place] [address]    Microbiology: Recent Results (from the past 240 hour(s))  Blood culture (routine x 2)     Status: None (Preliminary result)   Collection Time: 01/02/20  5:30 AM   Specimen: BLOOD  Result Value Ref Range Status   Specimen Description BLOOD RIGHT ANTECUBITAL  Final   Special Requests   Final    BOTTLES DRAWN AEROBIC AND ANAEROBIC Blood Culture results may not be optimal due to an inadequate volume of blood received in culture bottles   Culture  Setup Time   Final    GRAM NEGATIVE RODS IN BOTH AEROBIC AND ANAEROBIC BOTTLES CRITICAL RESULT CALLED TO, READ BACK BY AND VERIFIED WITH: PHARMD LYDIA C. [phone] FCP    Culture   Final    GRAM NEGATIVE RODS CULTURE REINCUBATED FOR BETTER GROWTH Performed at [facility], [place] [address], [place], [place] [address]    Report Status PENDING  Incomplete  Blood culture (routine x 2)     Status: None (Preliminary result)   Collection Time: 01/02/20  5:30 AM   Specimen: BLOOD  Result Value Ref Range Status   Specimen Description BLOOD LEFT ANTECUBITAL  Final   Special Requests   Final    BOTTLES DRAWN AEROBIC AND ANAEROBIC Blood Culture adequate volume   Culture  Setup Time   Final    GRAM NEGATIVE RODS IN BOTH AEROBIC AND ANAEROBIC BOTTLES    Culture   Final    GRAM NEGATIVE RODS CULTURE REINCUBATED FOR BETTER GROWTH Performed at [facility], [place] [address], [place], [place] [address]    Report Status PENDING  Incomplete  Blood Culture ID Panel (Reflexed)     Status: Abnormal    Collection Time: 01/02/20  5:30 AM  Result Value Ref Range Status   Enterococcus species NOT DETECTED NOT DETECTED Final   Listeria monocytogenes NOT DETECTED NOT DETECTED Final   Staphylococcus species NOT DETECTED NOT DETECTED Final   Staphylococcus aureus (BCID) NOT DETECTED NOT DETECTED Final   Streptococcus species NOT DETECTED NOT DETECTED Final   Streptococcus agalactiae NOT DETECTED NOT DETECTED Final   Streptococcus pneumoniae NOT DETECTED NOT DETECTED Final   Streptococcus pyogenes NOT DETECTED NOT DETECTED Final   Acinetobacter baumannii NOT DETECTED NOT DETECTED Final   Enterobacteriaceae species DETECTED (A) NOT DETECTED Final    Comment: Enterobacteriaceae represent a large family of gram-negative bacteria, not a single organism. CRITICAL RESULT CALLED TO, READ BACK BY AND VERIFIED WITH: PHARMD LYDIA C. [phone] FCP    Enterobacter cloacae complex DETECTED (A) NOT DETECTED Final    Comment: CRITICAL RESULT CALLED TO, READ BACK BY AND VERIFIED WITH: PHARMD LYDIA C. [phone] FCP    Escherichia coli NOT DETECTED NOT DETECTED Final   Klebsiella oxytoca NOT DETECTED NOT DETECTED Final   Klebsiella pneumoniae NOT DETECTED NOT DETECTED Final   Proteus species NOT DETECTED NOT DETECTED Final   Serratia marcescens NOT DETECTED NOT DETECTED Final   Carbapenem resistance NOT DETECTED NOT DETECTED Final   Haemophilus influenzae NOT DETECTED NOT DETECTED Final   Neisseria meningitidis NOT DETECTED NOT DETECTED Final  Pseudomonas aeruginosa NOT DETECTED NOT DETECTED Final   Candida albicans NOT DETECTED NOT DETECTED Final   Candida glabrata NOT DETECTED NOT DETECTED Final   Candida krusei NOT DETECTED NOT DETECTED Final   Candida parapsilosis NOT DETECTED NOT DETECTED Final   Candida tropicalis NOT DETECTED NOT DETECTED Final    Comment: Performed at [facility], [place] [address] [place], [place] [address]  Novel Coronavirus, NAA (Hosp order, Send-out to Ref Lab; TAT  18-24 hrs     Status: None   Collection Time: 01/02/20  7:48 AM   Specimen: Nasopharyngeal Swab; Respiratory  Result Value Ref Range Status   SARS-CoV-2, NAA NOT DETECTED NOT DETECTED Final    Comment: (NOTE) This nucleic acid amplification test was developed and its performance characteristics determined by [employer]. Nucleic acid amplification tests include RT-PCR and TMA. This test has not been FDA cleared or approved. This test has been authorized by FDA under an Emergency Use Authorization (EUA). This test is only authorized for the duration of time the declaration that circumstances exist justifying the authorization of the emergency use of in vitro diagnostic tests for detection of SARS-CoV-2 virus and/or diagnosis of COVID-19 infection under section 564(b)(1) of the Act, 21 U.S.C. 601VIF-5(P) (1), unless the authorization is terminated or revoked sooner. When diagnostic testing is negative, the possibility of a false negative result should be considered in the context of a patient's recent exposures and the presence of clinical signs and symptoms consistent with COVID-19. An individual without symptoms of COVID- 19 and who is not shedding SARS-CoV-2  virus would expect to have a negative (not detected) result in this assay. Performed At: [facility] [address] [place], [place] [id number] Rush Farmer MD JW:[id number]    [place]  Final    Comment: Performed at [facility], [place] [address] [place], [place] [address]  C difficile quick scan w PCR reflex     Status: None   Collection Time: 01/03/20 12:11 AM   Specimen: STOOL  Result Value Ref Range Status   C Diff antigen NEGATIVE NEGATIVE Final   C Diff toxin NEGATIVE NEGATIVE Final   C Diff interpretation No C. difficile detected.  Final    Comment: Performed at [facility], [place] [address], [place], [place] [address]    Radiographs and labs were personally  reviewed by me.   Bobby Rumpf, MD [facility] [phone] 01/03/2020, 5:51 PM

## 2020-01-03 NOTE — Progress Notes (Signed)
Received consult for TPN.  Per H&P, patient had her femoral PICC changed 2 days ago. BCID now positive with GNR - kleb  Patient may need line replacement?  Discussed with Dr Erlinda Hong - Will hold TPN tonight for evaluation of line/need for replacement Will f/u in am  Barth Kirks, PharmD, BCPS, BCCCP Clinical Pharmacist [phone]  Please check AMION for all [place] numbers  01/03/2020 8:29 AM

## 2020-01-03 NOTE — Progress Notes (Addendum)
PROGRESS NOTE  Caitlin Peters KDT:[id number] DOB: [birth year] DOA: 01/02/2020 PCP: [name], [name], MD  Brief summary:   H/o Crohn's disease s/p short gut syndrome on home tpn presents to the ED due to fever and ab pain.  Since moving from [place] to [place] in November of last year this is the patient's fourth admission into the hospital.   HPI/Recap of past 24 hours:  Fever resolved, currently denies pain, no nausea, no vomiting Has a large mushy bm Hemodynamically stable  Assessment/Plan: Principal Problem:   Crohn's colitis ([place]) Active Problems:   Fever   Short gut syndrome   HTN (hypertension)   GERD (gastroesophageal reflux disease)   Chronic pain syndrome   Acute kidney injury superimposed on chronic kidney disease ([name])   On total parenteral nutrition (TPN)   Elevated liver enzymes  Crohn's colitis, short gut syndrome, chronic diarrhea:  Elevate LFT with mild biliary and pancreatic duct dilatation -Patient presents with fever and complaints of severe lower abdominal pain with radiation into her back and diarrhea.   -CT imaging showed mild left sided colitis,  possible Crohn's colitis ?  -cdiff negative  -he is currently on entocort, iv zosyn -diet order per GI, will follow GI recommendation   Recurrent enterobacter Bacteremia Currently Hemodynamically stable, no leukocytosis but presents with  fever   currently on zosyn Source From gi? She also has a left thigh femoral picc that was placed in 08/2019 in [place] (per patient she has not other vascular access left, that is why she had a femoral line placed for home TPN) Hold off TPN for now ID consulted    AKI on CKDIII, anemia of chronic disease HGB stable at baseline  UA no bacteria, no obstructive nephropathy on CT scan cr baseline 1.2, on presentation is 1.6 Continue hydration, repeat BMP in the morning, renal dosing meds  Essential hypertension: Home medications include amlodipine 5 mg  daily and metoprolol 100 mg twice daily. -Continue home regimen  Chronic pain syndrome: Patient on Dilaudid 4 mg every 4 hours as needed for pain at home. -Switch to IV Dilaudid during acute period  Pulmonary nodule: Noted to be 6 mm noted in the right lower lobe.  Patient reports that she chronically gets these nodules that spontaneously resolved -Recommend repeat follow-up CT outpatient setting 6 months  DVT Prophylaxis: Lovenox  Code Status: Full Family Communication: patient   Disposition Plan: Not ready to discharge   Consultants:  GI  ID  Procedures:  none  Antibiotics:  zosyn   Objective: BP 134/64 (BP Location: Right Arm)   Pulse 63   Temp 99.1 F (37.3 C) (Oral)   Resp 17   Ht 5' 8"  (1.727 m)   Wt 62.4 kg   SpO2 100%   BMI 20.92 kg/m   Intake/Output Summary (Last 24 hours) at 01/03/2020 0855 Last data filed at 01/03/2020 0300 Gross per 24 hour  Intake 330.95 ml  Output 950 ml  Net -619.05 ml   Filed Weights   01/02/20 1428  Weight: 62.4 kg    Exam: Patient is examined daily including today on 01/03/2020, exams remain the same as of yesterday except that has changed    General:  NAD  Cardiovascular: RRR  Respiratory: CTABL  Abdomen: mild tenderness, but Soft/ND, positive BS  Musculoskeletal: No Edema  Neuro: alert, oriented   Data Reviewed: Basic Metabolic Panel: Recent Labs  Lab 01/02/20 0231 01/03/20 0317  NA 137 140  K 3.5 4.0  CL 102 108  CO2 24 23  GLUCOSE 102* 67*  BUN 46* 35*  CREATININE 1.63* 1.66*  CALCIUM 9.0 8.8*  MG  --  1.7  PHOS  --  3.8   Liver Function Tests: Recent Labs  Lab 01/02/20 0231 01/03/20 0317  AST 447* 100*  ALT 403* 224*  ALKPHOS 300* 239*  BILITOT 0.9 0.8  PROT 6.3* 5.6*  ALBUMIN 2.7* 2.4*   Recent Labs  Lab 01/02/20 0231 01/03/20 0317  LIPASE 16 13   No results for input(s): AMMONIA in the last 168 hours. CBC: Recent Labs  Lab 01/02/20 0231 01/03/20 0317  WBC 8.1  8.1 8.1   NEUTROABS 7.6 6.6  HGB 8.4*  8.5* 8.0*  HCT 26.0*  26.3* 24.6*  MCV 94.2  93.3 94.3  PLT 190  191 116*   Cardiac Enzymes:   No results for input(s): CKTOTAL, CKMB, CKMBINDEX, TROPONINI in the last 168 hours. BNP (last 3 results) Recent Labs    10/12/19 2059 10/28/19 0435 11/26/19 0209  BNP 253.9* 162.4* 179.1*    ProBNP (last 3 results) No results for input(s): PROBNP in the last 8760 hours.  CBG: No results for input(s): GLUCAP in the last 168 hours.  Recent Results (from the past 240 hour(s))  Blood culture (routine x 2)     Status: None (Preliminary result)   Collection Time: 01/02/20  5:30 AM   Specimen: BLOOD  Result Value Ref Range Status   Specimen Description BLOOD RIGHT ANTECUBITAL  Final   Special Requests   Final    BOTTLES DRAWN AEROBIC AND ANAEROBIC Blood Culture results may not be optimal due to an inadequate volume of blood received in culture bottles   Culture  Setup Time   Final    GRAM NEGATIVE RODS IN BOTH AEROBIC AND ANAEROBIC BOTTLES CRITICAL RESULT CALLED TO, READ BACK BY AND VERIFIED WITH: [place] C. [phone] FCP Performed at [facility], [place] [address], [place], [place] [address]    Culture GRAM NEGATIVE RODS  Final   Report Status PENDING  Incomplete  Blood culture (routine x 2)     Status: None (Preliminary result)   Collection Time: 01/02/20  5:30 AM   Specimen: BLOOD  Result Value Ref Range Status   Specimen Description BLOOD LEFT ANTECUBITAL  Final   Special Requests   Final    BOTTLES DRAWN AEROBIC AND ANAEROBIC Blood Culture adequate volume   Culture  Setup Time   Final    GRAM NEGATIVE RODS IN BOTH AEROBIC AND ANAEROBIC BOTTLES Performed at [facility], [place] [address], [place], [place] [address]    Culture GRAM NEGATIVE RODS  Final   Report Status PENDING  Incomplete  Blood Culture ID Panel (Reflexed)     Status: Abnormal   Collection Time: 01/02/20  5:30 AM  Result Value Ref Range Status    Enterococcus species NOT DETECTED NOT DETECTED Final   Listeria monocytogenes NOT DETECTED NOT DETECTED Final   Staphylococcus species NOT DETECTED NOT DETECTED Final   Staphylococcus aureus (BCID) NOT DETECTED NOT DETECTED Final   Streptococcus species NOT DETECTED NOT DETECTED Final   Streptococcus agalactiae NOT DETECTED NOT DETECTED Final   Streptococcus pneumoniae NOT DETECTED NOT DETECTED Final   Streptococcus pyogenes NOT DETECTED NOT DETECTED Final   Acinetobacter baumannii NOT DETECTED NOT DETECTED Final   Enterobacteriaceae species DETECTED (A) NOT DETECTED Final    Comment: Enterobacteriaceae represent a large family of gram-negative bacteria, not a  single organism. CRITICAL RESULT CALLED TO, READ BACK BY AND VERIFIED WITH: PHARMD LYDIA C. [phone] FCP    Enterobacter cloacae complex DETECTED (A) NOT DETECTED Final    Comment: CRITICAL RESULT CALLED TO, READ BACK BY AND VERIFIED WITH: PHARMD LYDIA C. [phone] FCP    Escherichia coli NOT DETECTED NOT DETECTED Final   Klebsiella oxytoca NOT DETECTED NOT DETECTED Final   Klebsiella pneumoniae NOT DETECTED NOT DETECTED Final   Proteus species NOT DETECTED NOT DETECTED Final   Serratia marcescens NOT DETECTED NOT DETECTED Final   Carbapenem resistance NOT DETECTED NOT DETECTED Final   Haemophilus influenzae NOT DETECTED NOT DETECTED Final   Neisseria meningitidis NOT DETECTED NOT DETECTED Final   Pseudomonas aeruginosa NOT DETECTED NOT DETECTED Final   Candida albicans NOT DETECTED NOT DETECTED Final   Candida glabrata NOT DETECTED NOT DETECTED Final   Candida krusei NOT DETECTED NOT DETECTED Final   Candida parapsilosis NOT DETECTED NOT DETECTED Final   Candida tropicalis NOT DETECTED NOT DETECTED Final    Comment: Performed at [facility], [place] [address] [place], [place] [address]  Novel Coronavirus, NAA (Hosp order, Send-out to Ref Lab; TAT 18-24 hrs     Status: None   Collection Time: 01/02/20  7:48 AM    Specimen: Nasopharyngeal Swab; Respiratory  Result Value Ref Range Status   SARS-CoV-2, NAA NOT DETECTED NOT DETECTED Final    Comment: (NOTE) This nucleic acid amplification test was developed and its performance characteristics determined by [employer]. Nucleic acid amplification tests include RT-PCR and TMA. This test has not been FDA cleared or approved. This test has been authorized by FDA under an Emergency Use Authorization (EUA). This test is only authorized for the duration of time the declaration that circumstances exist justifying the authorization of the emergency use of in vitro diagnostic tests for detection of SARS-CoV-2 virus and/or diagnosis of COVID-19 infection under section 564(b)(1) of the Act, 21 U.S.C. 027XAJ-2(I) (1), unless the authorization is terminated or revoked sooner. When diagnostic testing is negative, the possibility of a false negative result should be considered in the context of a patient's recent exposures and the presence of clinical signs and symptoms consistent with COVID-19. An individual without symptoms of COVID- 19 and who is not shedding SARS-CoV-2  virus would expect to have a negative (not detected) result in this assay. Performed At: [facility] [address] [place], [place] [id number] Rush Farmer MD OB:[id number]    [place]  Final    Comment: Performed at [facility], [place] [address] [place], [place] [address]  C difficile quick scan w PCR reflex     Status: None   Collection Time: 01/03/20 12:11 AM   Specimen: STOOL  Result Value Ref Range Status   C Diff antigen NEGATIVE NEGATIVE Final   C Diff toxin NEGATIVE NEGATIVE Final   C Diff interpretation No C. difficile detected.  Final    Comment: Performed at [facility], [place] [address], [place], [place] [address]     Studies: No results found.  Scheduled Meds: . acidophilus  1 capsule Oral Daily  . amLODipine  5 mg  Oral Daily  . budesonide  9 mg Oral Daily  . buPROPion  200 mg Oral Daily  . Chlorhexidine Gluconate Cloth  6 each Topical Daily  . cycloSPORINE  1 drop Both Eyes BID  . dicyclomine  20 mg Oral TID  . DULoxetine  90 mg Oral  Daily  . enoxaparin (LOVENOX) injection  40 mg Subcutaneous Q24H  . estradiol  2 mg Oral Daily  . famotidine  20 mg Oral QHS  . hydroxypropyl methylcellulose / hypromellose  1 drop Both Eyes QID  . lipase/protease/amylase  108,000 Units Oral TID AC  . metoprolol tartrate  100 mg Oral BID  . pantoprazole  40 mg Oral Daily  . potassium chloride SA  40 mEq Oral Daily  . sodium chloride flush  3 mL Intravenous Q12H  . sucralfate  1 g Oral TID WC & HS    Continuous Infusions: . sodium chloride 100 mL/hr at 01/03/20 0247  . piperacillin-tazobactam (ZOSYN)  IV 3.375 g (01/03/20 8478)     Time spent: 5mns, case discussed with ID Dr HJohnnye Sima I have personally reviewed and interpreted on  01/03/2020 daily labs,  imagings as discussed above under date review session and assessment and plans.  I reviewed all nursing notes, pharmacy notes, consultant notes,  vitals, pertinent old records  I have discussed plan of care as described above with RN , patient  on 01/03/2020   FFlorencia ReasonsMD, PhD, FACP  [facility]  Available via Epic secure chat 7am-7pm for nonurgent issues Please page for urgent issues, pager number available through [email or web address] .   01/03/2020, 8:55 AM  LOS: 1 day

## 2020-01-03 NOTE — Progress Notes (Signed)
[facility] Progress Note  CC:  Chron's flare, elevated LFTs  Subjective: No significant abdominal pain. No BM today. Slight nausea. No vomiting. Tolerating clear liquids. Off TPN since admitted to the hospital.    Objective:  Vital signs in last 24 hours: Temp:  [98.7 F (37.1 C)-99.1 F (37.3 C)] 99.1 F (37.3 C) (02/07 0505) Pulse Rate:  [63-72] 63 (02/07 0505) Resp:  [14-19] 17 (02/07 0505) BP: (127-151)/(64-79) 134/64 (02/07 0505) SpO2:  [100 %] 100 % (02/07 0505) Weight:  [62.4 kg] 62.4 kg (02/06 1428) Last BM Date: 01/02/20 General:  Alert 59 year old female in NAD. Heart: RRR, no murmur. Pulm:  Breath sounds clear throughout. Abdomen: Flat, nondistended, lack of adipose. Mild tenderness throughout the abdomen except RUQ nontender, no rebound or guarding. +BS x 4 quads. Large midline scar intact.  Extremities:  Without edema. Neurologic:  Alert and  oriented x4;  grossly normal neurologically. Psych:  Alert and cooperative. Normal mood and affect.  Intake/Output from previous day: 02/06 0701 - 02/07 0700 In: 331 [I.V.:281; IV Piggyback:50] Out: 950 [Urine:500; Stool:450] Intake/Output this shift: Total I/O In: 0  Out: 400 [Urine:400]  Lab Results: Recent Labs    01/02/20 0231 01/03/20 0317  WBC 8.1  8.1 8.1  HGB 8.4*  8.5* 8.0*  HCT 26.0*  26.3* 24.6*  PLT 190  191 116*   BMET Recent Labs    01/02/20 0231 01/03/20 0317  NA 137 140  K 3.5 4.0  CL 102 108  CO2 24 23  GLUCOSE 102* 67*  BUN 46* 35*  CREATININE 1.63* 1.66*  CALCIUM 9.0 8.8*   LFT Recent Labs    01/03/20 0317  PROT 5.6*  ALBUMIN 2.4*  AST 100*  ALT 224*  ALKPHOS 239*  BILITOT 0.8   PT/INR Recent Labs    01/02/20 1139  LABPROT 13.6  INR 1.1   Hepatitis Panel Recent Labs    01/02/20 1139  HEPBSAG NON REACTIVE  HCVAB NON REACTIVE  HEPAIGM NON REACTIVE  HEPBIGM NON REACTIVE    CT ABDOMEN PELVIS W CONTRAST  Result Date: 01/02/2020 CLINICAL  DATA:  59 year old female with acute abdominal and pelvic pain with fever. History of Crohn's disease and chronic kidney disease. EXAM: CT ABDOMEN AND PELVIS WITH CONTRAST TECHNIQUE: Multidetector CT imaging of the abdomen and pelvis was performed using the standard protocol following bolus administration of intravenous contrast. CONTRAST:  31m OMNIPAQUE IOHEXOL 300 MG/ML  SOLN COMPARISON:  12/08/2019 and prior CTs FINDINGS: Lower chest: A 6 mm RIGHT LOWER lobe nodule (series 4: Image 4) is noted. It is difficult to determine if this area was imaged on prior abdomen/pelvic CTs. Hepatobiliary: The liver is unremarkable. The patient is status post cholecystectomy. Mild CBD and intrahepatic biliary dilatation again noted. Pancreas: Mild pancreatic ductal dilatation is unchanged. No focal hepatic abnormalities noted. Spleen: Mild splenomegaly appears increased. Adrenals/Urinary Tract: The kidneys, adrenal glands and bladder are unremarkable. Stomach/Bowel: Evidence of partial colectomy with ileocolonic anastomosis again noted. Unchanged mildly distended fluid and gas-filled colon within the LEFT abdomen and pelvis with equivocal mild circumferential wall thickening. No dilated small bowel loops are noted. Little significant changes noted since the prior study. Vascular/Lymphatic: A LEFT femoral catheter is noted. Mild aortic atherosclerotic calcification noted without aneurysm. No definite enlarged nodes identified. Reproductive: Status post hysterectomy. No adnexal masses. Other: No ascites, abscess or pneumoperitoneum. Musculoskeletal: No acute or suspicious bony abnormality. IMPRESSION: 1. Unchanged mildly distended loops of fluid and gas filled colon  within the LEFT abdomen and pelvis with equivocal mild circumferential wall thickening. This may represent a colitis. No evidence of small bowel obstruction or pneumoperitoneum. 2. Mild splenomegaly, apparent slightly increased from the prior study. 3. 6 mm RIGHT LOWER  lobe nodule. Non-contrast chest CT at 6-12 months is recommended. If the nodule is stable at time of repeat CT, then future CT at 18-24 months (from today's scan) is considered optional for low-risk patients, but is recommended for high-risk patients. This recommendation follows the consensus statement: Guidelines for Management of Incidental Pulmonary Nodules Detected on CT Images: From the Fleischner Society 2017; Radiology 2017; 284:228-243. 4. Aortic Atherosclerosis (ICD10-I70.0). Electronically Signed   By: Margarette Canada M.D.   On: 01/02/2020 09:14   MR ABDOMEN MRCP WO CONTRAST  Result Date: 01/03/2020 CLINICAL DATA:  Elevated liver enzymes.  Mild biliary distention. EXAM: MRI ABDOMEN WITHOUT CONTRAST  (INCLUDING MRCP) TECHNIQUE: Multiplanar multisequence MR imaging of the abdomen was performed. Heavily T2-weighted images of the biliary and pancreatic ducts were obtained, and three-dimensional MRCP images were rendered by post processing. COMPARISON:  CT scan 01/02/2020.  CT scan 11/25/2019. FINDINGS: Lower chest: Heart size upper normal. Hepatobiliary: Liver measures 20 cm craniocaudal length. No focal abnormality in the liver parenchyma on this noncontrast study. Gallbladder surgically absent. Only minimal prominence of the intrahepatic bile ducts with extrahepatic common duct measuring 9 mm diameter in the hepatoduodenal ligament. Common bile duct measures 10 mm in the head of the pancreas. Overall, the intra and extrahepatic biliary ducts are decreased in distention when comparing back to the CT of 11/25/2019. The common bile duct was 14 mm in diameter when remeasured in the same dimension at the same level on that study. No evidence for choledocholithiasis. Pancreas: As before, the main pancreatic duct is dilated throughout its length from the tail to the ampulla. Dilatation measures in the 5-6 mm range, also decrease since 11/25/2019 when I remeasure at a similar level at 8 mm. No focal mass lesion noted  within the pancreatic parenchyma on this noncontrast motion degraded study. Spleen: Upper normal at 12.5 cm craniocaudal length. No focal abnormality. Adrenals/Urinary Tract: No adrenal nodule or mass. Kidneys unremarkable. Stomach/Bowel: Stomach is nondistended. No obvious small bowel dilatation. Colon in the left abdomen is distended, better demonstrated on recent CT scan. Vascular/Lymphatic: No abdominal aortic aneurysm. No bulky abdominal lymphadenopathy evident. Other:  No intraperitoneal free fluid. Musculoskeletal: No abnormal marrow signal within the visualized bony anatomy. IMPRESSION: 1. Motion degraded study, obscuring fine detail. 2. Mild intra and extrahepatic biliary duct dilatation to the level of the ampulla. The degree of distention has decreased when comparing to CT scan of 11/25/2019. No choledocholithiasis. No obstructing mass lesion evident on this noncontrast motion degraded study. 3. Diffuse dilatation of the main pancreatic duct throughout its length again noted. Distention has also decreased when comparing to 11/25/2019. Pancreatic duct distention extends from the tail to the ampulla. No intraluminal stone or obstructing lesion evident. 4. Hepatomegaly Electronically Signed   By: Misty Stanley M.D.   On: 01/03/2020 09:20    Assessment / Plan:  67. 59 year old female with Complex Crohn's disease presents to the ED with  Fever 101F, N/V, diarrhea and lower abdominal pain. CTAP 2/7 showed mild left sided colitis. C.diff negative. GI panel pending.  -clear liquid diet -pain management per the hospitalist  -Zofran IV Q 6 hrs PRN -PPI po QD -? Should Entocort be dc'd in setting of enterobacteriaceae bacteremia, further recommendation per Dr. Rush Landmark  2. Elevated LFTs. CTAP with mild CBD and intrahepatic biliary dilatation and mild pancreatic duct dilatation which is unchanged from prior image studies.  MRCP wo contrast 2/6 mild identified intra and extra hepatic biliary dilatation  to the level of the ampulla, no mass or choledocholithiasis, diffuse dilation of the main pancreatic duct with distension. Hepatomegaly noted. Questionable hx of ascending cholangitis 12/31-11/30/2019.  WBC 8.1. Blood cx + for enterobacteriaceae 2/6. Started on Zosyn. Tmax 1.1 on 2/6. She is afebrile today. Acute hepatitis panel negative. Alk Phos 239. AST 100. ALT 224. T. Bili 0.8. -Repeat hepatic panel and lipase in am, if LFTs remain elevated to consider EUS -Continue Zosyn IV Q 8 hrs   3. AKI on CKD.  Cr. 1.66 Continue IV NS @ 100cc/hr  4. Anemia. Hg 8.0. HCT 24.6. MCV 94.3.   Further recommendations per Dr. Rush Landmark       Principal Problem:   Crohn's colitis [facility]) Active Problems:   Fever   Short gut syndrome   HTN (hypertension)   GERD (gastroesophageal reflux disease)   Chronic pain syndrome   Acute kidney injury superimposed on chronic kidney disease ([place])   On total parenteral nutrition (TPN)   Elevated liver enzymes     LOS: 1 day   Noralyn Pick  01/03/2020, 1:11 PM

## 2020-01-04 DIAGNOSIS — Z8619 Personal history of other infectious and parasitic diseases: Secondary | ICD-10-CM

## 2020-01-04 DIAGNOSIS — K50118 Crohn's disease of large intestine with other complication: Secondary | ICD-10-CM

## 2020-01-04 DIAGNOSIS — B9689 Other specified bacterial agents as the cause of diseases classified elsewhere: Secondary | ICD-10-CM

## 2020-01-04 DIAGNOSIS — Z888 Allergy status to other drugs, medicaments and biological substances status: Secondary | ICD-10-CM

## 2020-01-04 DIAGNOSIS — R7881 Bacteremia: Secondary | ICD-10-CM

## 2020-01-04 DIAGNOSIS — Z881 Allergy status to other antibiotic agents status: Secondary | ICD-10-CM

## 2020-01-04 DIAGNOSIS — T80219A Unspecified infection due to central venous catheter, initial encounter: Secondary | ICD-10-CM

## 2020-01-04 DIAGNOSIS — Z885 Allergy status to narcotic agent status: Secondary | ICD-10-CM

## 2020-01-04 LAB — CBC
HCT: 24.2 % — ABNORMAL LOW (ref 36.0–46.0)
Hemoglobin: 7.5 g/dL — ABNORMAL LOW (ref 12.0–15.0)
MCH: 29.8 pg (ref 26.0–34.0)
MCHC: 31 g/dL (ref 30.0–36.0)
MCV: 96 fL (ref 80.0–100.0)
Platelets: 144 10*3/uL — ABNORMAL LOW (ref 150–400)
RBC: 2.52 MIL/uL — ABNORMAL LOW (ref 3.87–5.11)
RDW: 14.6 % (ref 11.5–15.5)
WBC: 6.1 10*3/uL (ref 4.0–10.5)
nRBC: 0 % (ref 0.0–0.2)

## 2020-01-04 LAB — COMPREHENSIVE METABOLIC PANEL
ALT: 163 U/L — ABNORMAL HIGH (ref 0–44)
AST: 48 U/L — ABNORMAL HIGH (ref 15–41)
Albumin: 2.3 g/dL — ABNORMAL LOW (ref 3.5–5.0)
Alkaline Phosphatase: 213 U/L — ABNORMAL HIGH (ref 38–126)
Anion gap: 9 (ref 5–15)
BUN: 26 mg/dL — ABNORMAL HIGH (ref 6–20)
CO2: 21 mmol/L — ABNORMAL LOW (ref 22–32)
Calcium: 8.7 mg/dL — ABNORMAL LOW (ref 8.9–10.3)
Chloride: 109 mmol/L (ref 98–111)
Creatinine, Ser: 1.59 mg/dL — ABNORMAL HIGH (ref 0.44–1.00)
GFR calc Af Amer: 41 mL/min — ABNORMAL LOW (ref >60)
GFR calc non Af Amer: 35 mL/min — ABNORMAL LOW (ref >60)
Glucose, Bld: 78 mg/dL (ref 70–99)
Potassium: 4.3 mmol/L (ref 3.5–5.1)
Sodium: 139 mmol/L (ref 135–145)
Total Bilirubin: 0.9 mg/dL (ref 0.3–1.2)
Total Protein: 5.9 g/dL — ABNORMAL LOW (ref 6.5–8.1)

## 2020-01-04 LAB — ANA: Anti Nuclear Antibody (ANA): NEGATIVE

## 2020-01-04 LAB — PREALBUMIN: Prealbumin: 18.5 mg/dL (ref 18–38)

## 2020-01-04 LAB — DIFFERENTIAL
Abs Immature Granulocytes: 0.03 10*3/uL (ref 0.00–0.07)
Basophils Absolute: 0 10*3/uL (ref 0.0–0.1)
Basophils Relative: 0 %
Eosinophils Absolute: 0 10*3/uL (ref 0.0–0.5)
Eosinophils Relative: 1 %
Immature Granulocytes: 1 %
Lymphocytes Relative: 16 %
Lymphs Abs: 1 10*3/uL (ref 0.7–4.0)
Monocytes Absolute: 0.6 10*3/uL (ref 0.1–1.0)
Monocytes Relative: 10 %
Neutro Abs: 4.4 10*3/uL (ref 1.7–7.7)
Neutrophils Relative %: 72 %

## 2020-01-04 LAB — MAGNESIUM: Magnesium: 1.6 mg/dL — ABNORMAL LOW (ref 1.7–2.4)

## 2020-01-04 LAB — EPSTEIN-BARR VIRUS VCA, IGM: EBV VCA IgM: <36 U/mL (ref 0.0–35.9)

## 2020-01-04 LAB — TRIGLYCERIDES: Triglycerides: 214 mg/dL — ABNORMAL HIGH (ref <150)

## 2020-01-04 LAB — PHOSPHORUS: Phosphorus: 3.2 mg/dL (ref 2.5–4.6)

## 2020-01-04 MED ORDER — PIPERACILLIN-TAZOBACTAM 3.375 G IVPB
3.3750 g | Freq: Three times a day (TID) | INTRAVENOUS | Status: DC
  Administered 2020-01-04 – 2020-01-05 (×3): 3.375 g via INTRAVENOUS
  Filled 2020-01-04 (×3): qty 50

## 2020-01-04 MED ORDER — HYDROMORPHONE HCL 1 MG/ML IJ SOLN
1.0000 mg | INTRAMUSCULAR | Status: DC | PRN
  Administered 2020-01-04 – 2020-01-05 (×5): 1 mg via INTRAVENOUS
  Filled 2020-01-04 (×5): qty 1

## 2020-01-04 MED ORDER — MAGNESIUM SULFATE 2 GM/50ML IV SOLN
2.0000 g | Freq: Once | INTRAVENOUS | Status: AC
  Administered 2020-01-04: 2 g via INTRAVENOUS
  Filled 2020-01-04 (×2): qty 50

## 2020-01-04 MED ORDER — HYDROMORPHONE HCL 2 MG PO TABS
4.0000 mg | ORAL_TABLET | ORAL | Status: DC | PRN
  Administered 2020-01-04 (×2): 4 mg via ORAL
  Filled 2020-01-04 (×2): qty 2

## 2020-01-04 MED ORDER — BOOST / RESOURCE BREEZE PO LIQD CUSTOM
1.0000 | Freq: Three times a day (TID) | ORAL | Status: DC
  Administered 2020-01-05 – 2020-01-10 (×2): 1 via ORAL

## 2020-01-04 NOTE — Progress Notes (Signed)
Pharmacy Antibiotic Note  Caitlin Peters is a 59 y.o. female admitted on 01/02/2020 with E cloacae  bacteremia.  Pharmacy has been consulted for pip/tazo. Patient with h/o neurotoxicity with cefepime    Patient with femoral PICC for TPN just changed 2d PTA. Grew E cloacae 12/30 and was on levofloxacin then ciprofloxacin and metronidazole from 1/4 - 1/16. Cultures positive within 12hr of drawing. Febrile to 101 on admit but afebrile since.. WBC wnl, LA wnl.   ID consulted   Plan: Continue Pip/tazobactam 3.375g Q8 hr  Monitor clinical status, renal fx, f/u duration   Height: 5' 8"  (172.7 cm) Weight: 137 lb 9.1 oz (62.4 kg) IBW/kg (Calculated) : 63.9  Temp (24hrs), Avg:98.6 F (37 C), Min:98.2 F (36.8 C), Max:98.8 F (37.1 C)  Recent Labs  Lab 01/02/20 0231 01/02/20 0734 01/03/20 0317 01/04/20 0341  WBC 8.1  8.1  --  8.1 6.1  CREATININE 1.63*  --  1.66* 1.59*  LATICACIDVEN  --  0.5  --   --     Estimated Creatinine Clearance: 38 mL/min (A) (by C-G formula based on SCr of 1.59 mg/dL (H)).    Allergies  Allergen Reactions  . Cefepime Other (See Comments)    Neurotoxicity occurring in setting of AKI. Ceftriaxone tolerated during same admit  . Gabapentin Other (See Comments)    unknown  . Hyoscyamine Hives and Swelling    Legs swelling   Disorientation  . Lyrica [Pregabalin] Other (See Comments)    unknown  . Meperidine Hives    Other reaction(s): GI Upset Due to [place]   . Topamax [Topiramate] Other (See Comments)    unknown  . Fentanyl Rash  . Morphine And Related Rash    Antimicrobials this admission: Pip/tazo 2/6 >>    Microbiology results: 2/6 BCx: E cloacae   Zared Knoth A. Levada Dy, PharmD, BCPS, FNKF Clinical Pharmacist [place] Please utilize Amion for appropriate phone number to reach the unit pharmacist ([place])

## 2020-01-04 NOTE — Progress Notes (Addendum)
[facility] Progress Note  CC:  Crohn's, elevated LFT's  Subjective:  Reports abdominal pain and pain "all over".  No BM since sample for taken for stool studies.  Objective:  Vital signs in last 24 hours: Temp:  [98.2 F (36.8 C)-98.8 F (37.1 C)] 98.2 F (36.8 C) (02/08 0420) Pulse Rate:  [57-66] 63 (02/08 0900) Resp:  [16-18] 16 (02/08 0420) BP: (143-174)/(65-75) 174/73 (02/08 0900) SpO2:  [100 %] 100 % (02/08 0420) Last BM Date: 01/02/20 General:  Alert, Well-developed, in NAD Heart:  Regular rate and rhythm; no murmurs Pulm:  CTAB.  No increased WOB. Abdomen:  Soft, non-distended.  BS present.  Mild tenderness throughout the abdomen.  Large midline scar noted. Extremities:  Without edema. Neurologic:  Alert and oriented x 4;  grossly normal neurologically. Psych:  Alert and cooperative. Normal mood and affect.  Intake/Output from previous day: 02/07 0701 - 02/08 0700 In: 3292.1 [I.V.:3092.1; IV Piggyback:200] Out: 1600 [Urine:1600]  Lab Results: Recent Labs    01/02/20 0231 01/03/20 0317 01/04/20 0341  WBC 8.1  8.1 8.1 6.1  HGB 8.4*  8.5* 8.0* 7.5*  HCT 26.0*  26.3* 24.6* 24.2*  PLT 190  191 116* 144*   BMET Recent Labs    01/02/20 0231 01/03/20 0317 01/04/20 0341  NA 137 140 139  K 3.5 4.0 4.3  CL 102 108 109  CO2 24 23 21*  GLUCOSE 102* 67* 78  BUN 46* 35* 26*  CREATININE 1.63* 1.66* 1.59*  CALCIUM 9.0 8.8* 8.7*   LFT Recent Labs    01/04/20 0341  PROT 5.9*  ALBUMIN 2.3*  AST 48*  ALT 163*  ALKPHOS 213*  BILITOT 0.9   PT/INR Recent Labs    01/02/20 1139  LABPROT 13.6  INR 1.1   Hepatitis Panel Recent Labs    01/02/20 1139  HEPBSAG NON REACTIVE  HCVAB NON REACTIVE  HEPAIGM NON REACTIVE  HEPBIGM NON REACTIVE    MR ABDOMEN MRCP WO CONTRAST  Result Date: 01/03/2020 CLINICAL DATA:  Elevated liver enzymes.  Mild biliary distention. EXAM: MRI ABDOMEN WITHOUT CONTRAST  (INCLUDING MRCP) TECHNIQUE: Multiplanar  multisequence MR imaging of the abdomen was performed. Heavily T2-weighted images of the biliary and pancreatic ducts were obtained, and three-dimensional MRCP images were rendered by post processing. COMPARISON:  CT scan 01/02/2020.  CT scan 11/25/2019. FINDINGS: Lower chest: Heart size upper normal. Hepatobiliary: Liver measures 20 cm craniocaudal length. No focal abnormality in the liver parenchyma on this noncontrast study. Gallbladder surgically absent. Only minimal prominence of the intrahepatic bile ducts with extrahepatic common duct measuring 9 mm diameter in the hepatoduodenal ligament. Common bile duct measures 10 mm in the head of the pancreas. Overall, the intra and extrahepatic biliary ducts are decreased in distention when comparing back to the CT of 11/25/2019. The common bile duct was 14 mm in diameter when remeasured in the same dimension at the same level on that study. No evidence for choledocholithiasis. Pancreas: As before, the main pancreatic duct is dilated throughout its length from the tail to the ampulla. Dilatation measures in the 5-6 mm range, also decrease since 11/25/2019 when I remeasure at a similar level at 8 mm. No focal mass lesion noted within the pancreatic parenchyma on this noncontrast motion degraded study. Spleen: Upper normal at 12.5 cm craniocaudal length. No focal abnormality. Adrenals/Urinary Tract: No adrenal nodule or mass. Kidneys unremarkable. Stomach/Bowel: Stomach is nondistended. No obvious small bowel dilatation. Colon in the left abdomen is distended,  better demonstrated on recent CT scan. Vascular/Lymphatic: No abdominal aortic aneurysm. No bulky abdominal lymphadenopathy evident. Other:  No intraperitoneal free fluid. Musculoskeletal: No abnormal marrow signal within the visualized bony anatomy. IMPRESSION: 1. Motion degraded study, obscuring fine detail. 2. Mild intra and extrahepatic biliary duct dilatation to the level of the ampulla. The degree of  distention has decreased when comparing to CT scan of 11/25/2019. No choledocholithiasis. No obstructing mass lesion evident on this noncontrast motion degraded study. 3. Diffuse dilatation of the main pancreatic duct throughout its length again noted. Distention has also decreased when comparing to 11/25/2019. Pancreatic duct distention extends from the tail to the ampulla. No intraluminal stone or obstructing lesion evident. 4. Hepatomegaly Electronically Signed   By: Misty Stanley M.D.   On: 01/03/2020 09:20   Assessment / Recommendations: 54. 59 year old female with complex Crohn's disease presents to the ED with Fever 101F, N/V, diarrhea and lower abdominal pain. CTAP 2/7 showed mild left sided colitis.C.diff negative. GI panel pending.  -clear liquid diet -pain management per the hospitalist  -antiemetics prn -PPI po QD -Continue Entocort 9 mg daily. -May need to consider adding Miralax or other gentle bowel regimen soon if she continues without further BM's.  2. Elevated LFTs. CTAPwith mild CBD and intrahepatic biliary dilatation and mild pancreatic duct dilatation which is unchanged from prior image studies. MRCP wo contrast 2/6 mild identified intra and extra hepatic biliary dilatation to the level of the ampulla, no mass or choledocholithiasis, diffuse dilation of the main pancreatic duct with distension. Hepatomegaly noted. Questionable hx of ascending cholangitis 12/31-11/30/2019 at which time she underwent ERCP with sphincterotomy and sphincteroplasy.  WBC normal. Blood cx + for enterobacteriaceae 2/6. Started on Zosyn. Tmax 1.1 on 2/6. She is afebrile today. Acute hepatitis panel negative. Alk Phos 213. AST 48. ALT 163. T. Bili 0.9.  All improved from yesterday. -Follow hepatic panel.  May need outpatient EUS to evaluate chronic dilated appearance of the PD and ampulla.  No ERCP planned unless LFT's rise again of if she has persistent bacteremia. -Repeat blood cultures pending. -Continue  Zosyn IV Q 8 hrs per ID.  3. AKI on CKD.  Cr. 1.59 Continue IV NS @ 100cc/hr  4. Anemia. Hg 7.5 down from 8.0 grams yesterday. MCV 96.  No evidence of GI bleeding.     LOS: 2 days   Caitlin Peters. Caitlin Peters  01/04/2020, 10:26 AM     Attending Physician Note   I have taken an interval history, reviewed the chart and examined the patient. I agree with the Advanced Practitioner's note, impression and recommendations.   Crohn's disease, short gut syndrome, Enterobacter bacteremia, biliary dilation with ERCP sphincterotomy/sphincteroplasty on 12/31. MRICP on 2/6 was motion degraded and showed decreased biliary dilation, no biliary filling defects and PD remains dilated. LFTs steadily improving. GI tract, IV line, or less likely a biliary source, of bacteremia.   Continue Zosyn per ID recommendations Continue Entocort PPI qd No plans for repeat ERCP unless LFTs increase and suggest a biliary etiology Trend CMP, CBC Consider outpatient EUS to further evaluate dilated PD GI will follow  Lucio Edward, MD [facility]

## 2020-01-04 NOTE — Progress Notes (Addendum)
Pharmacy Consult - TPN  62 yof with history of complex Crohn's disease on TPN PTA for short gut syndrome presenting 01/02/20 with abdominal pain and diarrhea. Patient reportedly has access issues and per H&P, had her femoral PICC changed 2 days PTA.   Patient has recurrent bacteremia with retained line. BCx from 01/02/20 are growing enterobacter in 4 of 4 bottles. ID and GI are following. Patient may need line replacement but difficult case with previous access issues. GI started CLD.  Plan: Discussed with Dr Sharlet Salina of [facility] - continue to hold TPN tonight for evaluation of line/need for replacement F/u repeat BCx, ID and GI plans F/u further plans 2/9 AM [facility] [phone]) states they are faxing patient's current home TPN Rx today Give mag sulfate 2g IV x 1 and f/u labs AM labs   Elicia Lamp, PharmD, BCPS Please check AMION for all [facility] contact numbers Clinical Pharmacist 01/04/2020 9:52 AM

## 2020-01-04 NOTE — Progress Notes (Signed)
[facility] for Infectious Disease  Date of Admission:  01/02/2020     Total days of antibiotics 3         ASSESSMENT:  Ms. Mies is a 59 y/o female with complex Crohn's disease requiring chronic TPN presenting with fever and found to have Enterobacter cloacae complex bacteremia with concern for femoral line infection. Repeat blood cultures obtained today with sensitivities from previous culture pending. We discussed her extensive gastrointestinal history and limited venous access which make this a challenging situation as the ideal thing would be to remove the line, however we may be either able to treat through the line infection or consider possible exchange given her chronic TPN need. Will continue Zosyn pending sensitivities. She has had previous success with ethanol locks through the [facility].  PLAN:  1. Continue piperacillin-tazobactam.  2. Consider line exchange when bacteremia cleared 3. Monitor repeat cultures for clearance of bacteremia 4. Crohn's management per GI team.   Principal Problem:   Crohn's colitis ([place]) Active Problems:   Fever   Short gut syndrome   HTN (hypertension)   GERD (gastroesophageal reflux disease)   Chronic pain syndrome   Acute kidney injury superimposed on chronic kidney disease ([place])   On total parenteral nutrition (TPN)   Elevated liver enzymes   . acidophilus  1 capsule Oral Daily  . amLODipine  5 mg Oral Daily  . budesonide  9 mg Oral Daily  . buPROPion  200 mg Oral Daily  . Chlorhexidine Gluconate Cloth  6 each Topical Daily  . cycloSPORINE  1 drop Both Eyes BID  . dicyclomine  20 mg Oral TID  . DULoxetine  90 mg Oral Daily  . enoxaparin (LOVENOX) injection  40 mg Subcutaneous Q24H  . estradiol  2 mg Oral Daily  . famotidine  20 mg Oral QHS  . hydroxypropyl methylcellulose / hypromellose  1 drop Both Eyes QID  . lipase/protease/amylase  108,000 Units Oral TID AC  . metoprolol tartrate  100 mg Oral BID  .  pantoprazole  40 mg Oral Daily  . potassium chloride SA  40 mEq Oral Daily  . sodium chloride flush  3 mL Intravenous Q12H  . sucralfate  1 g Oral TID WC & HS    SUBJECTIVE:  Afebrile overnight with no acute events. Has several questions regarding treatment options including possibility of ethanol locks to prevent infection. Remains on TPN. Having generalized abdominal pain.   Allergies  Allergen Reactions  . Cefepime Other (See Comments)    Neurotoxicity occurring in setting of AKI. Ceftriaxone tolerated during same admit  . Gabapentin Other (See Comments)    unknown  . Hyoscyamine Hives and Swelling    Legs swelling   Disorientation  . Lyrica [Pregabalin] Other (See Comments)    unknown  . Meperidine Hives    Other reaction(s): GI Upset Due to [place]   . Topamax [Topiramate] Other (See Comments)    unknown  . Fentanyl Rash  . Morphine And Related Rash     Review of Systems: Review of Systems  Constitutional: Negative for chills, fever and weight loss.  Respiratory: Negative for cough, shortness of breath and wheezing.   Cardiovascular: Negative for chest pain and leg swelling.  Gastrointestinal: Positive for abdominal pain. Negative for constipation, diarrhea, nausea and vomiting.  Skin: Negative for rash.      OBJECTIVE: Vitals:   01/03/20 1622 01/03/20 2050 01/04/20 0420 01/04/20 0900  BP: (!) 143/75 (!) 153/65 (!) 148/68 (!) 174/73  Pulse: 66 65 (!) 57 63  Resp: 16 18 16    Temp: 98.8 F (37.1 C) 98.7 F (37.1 C) 98.2 F (36.8 C)   TempSrc: Oral Oral Oral   SpO2: 100% 100% 100%   Weight:      Height:       Body mass index is 20.92 kg/m.  Physical Exam Constitutional:      General: She is not in acute distress.    Appearance: She is well-developed.  Cardiovascular:     Rate and Rhythm: Normal rate and regular rhythm.     Heart sounds: Normal heart sounds.  Pulmonary:     Effort: Pulmonary effort is normal.     Breath sounds: Normal breath  sounds.  Skin:    General: Skin is warm and dry.  Neurological:     Mental Status: She is alert and oriented to person, place, and time.  Psychiatric:        Behavior: Behavior normal.        Thought Content: Thought content normal.        Judgment: Judgment normal.     Lab Results Lab Results  Component Value Date   WBC 6.1 01/04/2020   HGB 7.5 (L) 01/04/2020   HCT 24.2 (L) 01/04/2020   MCV 96.0 01/04/2020   PLT 144 (L) 01/04/2020    Lab Results  Component Value Date   CREATININE 1.59 (H) 01/04/2020   BUN 26 (H) 01/04/2020   NA 139 01/04/2020   K 4.3 01/04/2020   CL 109 01/04/2020   CO2 21 (L) 01/04/2020    Lab Results  Component Value Date   ALT 163 (H) 01/04/2020   AST 48 (H) 01/04/2020   ALKPHOS 213 (H) 01/04/2020   BILITOT 0.9 01/04/2020     Microbiology: Recent Results (from the past 240 hour(s))  Blood culture (routine x 2)     Status: None (Preliminary result)   Collection Time: 01/02/20  5:30 AM   Specimen: BLOOD  Result Value Ref Range Status   Specimen Description BLOOD RIGHT ANTECUBITAL  Final   Special Requests   Final    BOTTLES DRAWN AEROBIC AND ANAEROBIC Blood Culture results may not be optimal due to an inadequate volume of blood received in culture bottles   Culture  Setup Time   Final    GRAM NEGATIVE RODS IN BOTH AEROBIC AND ANAEROBIC BOTTLES CRITICAL RESULT CALLED TO, READ BACK BY AND VERIFIED WITH: PHARMD LYDIA C. [id number] FCP    Culture   Final    GRAM NEGATIVE RODS IDENTIFICATION AND SUSCEPTIBILITIES TO FOLLOW Performed at [facility], [place] [address], [place], [place] [address]    Report Status PENDING  Incomplete  Blood culture (routine x 2)     Status: None (Preliminary result)   Collection Time: 01/02/20  5:30 AM   Specimen: BLOOD  Result Value Ref Range Status   Specimen Description BLOOD LEFT ANTECUBITAL  Final   Special Requests   Final    BOTTLES DRAWN AEROBIC AND ANAEROBIC Blood Culture adequate volume    Culture  Setup Time   Final    GRAM NEGATIVE RODS IN BOTH AEROBIC AND ANAEROBIC BOTTLES    Culture   Final    GRAM NEGATIVE RODS IDENTIFICATION AND SUSCEPTIBILITIES TO FOLLOW Performed at [facility], [place] [address], [place], [place] [address]    Report Status PENDING  Incomplete  Blood Culture ID Panel (Reflexed)     Status: Abnormal  Collection Time: 01/02/20  5:30 AM  Result Value Ref Range Status   Enterococcus species NOT DETECTED NOT DETECTED Final   Listeria monocytogenes NOT DETECTED NOT DETECTED Final   Staphylococcus species NOT DETECTED NOT DETECTED Final   Staphylococcus aureus (BCID) NOT DETECTED NOT DETECTED Final   Streptococcus species NOT DETECTED NOT DETECTED Final   Streptococcus agalactiae NOT DETECTED NOT DETECTED Final   Streptococcus pneumoniae NOT DETECTED NOT DETECTED Final   Streptococcus pyogenes NOT DETECTED NOT DETECTED Final   Acinetobacter baumannii NOT DETECTED NOT DETECTED Final   Enterobacteriaceae species DETECTED (A) NOT DETECTED Final    Comment: Enterobacteriaceae represent a large family of gram-negative bacteria, not a single organism. CRITICAL RESULT CALLED TO, READ BACK BY AND VERIFIED WITH: PHARMD LYDIA C. [id number] FCP    Enterobacter cloacae complex DETECTED (A) NOT DETECTED Final    Comment: CRITICAL RESULT CALLED TO, READ BACK BY AND VERIFIED WITH: PHARMD LYDIA C. [id number] FCP    Escherichia coli NOT DETECTED NOT DETECTED Final   Klebsiella oxytoca NOT DETECTED NOT DETECTED Final   Klebsiella pneumoniae NOT DETECTED NOT DETECTED Final   Proteus species NOT DETECTED NOT DETECTED Final   Serratia marcescens NOT DETECTED NOT DETECTED Final   Carbapenem resistance NOT DETECTED NOT DETECTED Final   Haemophilus influenzae NOT DETECTED NOT DETECTED Final   Neisseria meningitidis NOT DETECTED NOT DETECTED Final   Pseudomonas aeruginosa NOT DETECTED NOT DETECTED Final   Candida albicans NOT DETECTED NOT DETECTED Final    Candida glabrata NOT DETECTED NOT DETECTED Final   Candida krusei NOT DETECTED NOT DETECTED Final   Candida parapsilosis NOT DETECTED NOT DETECTED Final   Candida tropicalis NOT DETECTED NOT DETECTED Final    Comment: Performed at [facility], [place] [address] [place], [place] [address]  Novel Coronavirus, NAA (Hosp order, Send-out to Ref Lab; TAT 18-24 hrs     Status: None   Collection Time: 01/02/20  7:48 AM   Specimen: Nasopharyngeal Swab; Respiratory  Result Value Ref Range Status   SARS-CoV-2, NAA NOT DETECTED NOT DETECTED Final    Comment: (NOTE) This nucleic acid amplification test was developed and its performance characteristics determined by [employer]. Nucleic acid amplification tests include RT-PCR and TMA. This test has not been FDA cleared or approved. This test has been authorized by FDA under an Emergency Use Authorization (EUA). This test is only authorized for the duration of time the declaration that circumstances exist justifying the authorization of the emergency use of in vitro diagnostic tests for detection of SARS-CoV-2 virus and/or diagnosis of COVID-19 infection under section 564(b)(1) of the Act, 21 U.S.C. 263FHL-4(T) (1), unless the authorization is terminated or revoked sooner. When diagnostic testing is negative, the possibility of a false negative result should be considered in the context of a patient's recent exposures and the presence of clinical signs and symptoms consistent with COVID-19. An individual without symptoms of COVID- 19 and who is not shedding SARS-CoV-2  virus would expect to have a negative (not detected) result in this assay. Performed At: [facility] [address] [place], [place] [id number] Rush Farmer MD DS:[id number]    [place]  Final    Comment: Performed at [facility], [place] [address], [place], [place] [address]  C difficile quick scan w PCR reflex     Status: None    Collection Time: 01/03/20 12:11 AM   Specimen: STOOL  Result Value Ref Range Status   C  Diff antigen NEGATIVE NEGATIVE Final   C Diff toxin NEGATIVE NEGATIVE Final   C Diff interpretation No C. difficile detected.  Final    Comment: Performed at [facility], [place] [address] [place],  [address]     Terri Piedra, [place] for [facility] [phone] Pager  01/04/2020  11:23 AM

## 2020-01-04 NOTE — Progress Notes (Signed)
  Progress Note   Date: 01/04/2020  Patient Name: Caitlin Peters        MRN#: [id number]  Clarification of diagnosis:  severe malnutrition

## 2020-01-04 NOTE — Progress Notes (Signed)
Initial Nutrition Assessment  DOCUMENTATION CODES:   Not applicable  INTERVENTION:   -Boost Breeze po TID, each supplement provides 250 kcal and 9 grams of protein -RD will follow for diet advancement and supplement as appropriate -RD to follow for ability to resume TPN  NUTRITION DIAGNOSIS:   Increased nutrient needs related to chronic illness(short gut sundrome secondary to crohn's disease) as evidenced by estimated needs.  GOAL:   Patient will meet greater than or equal to 90% of their needs  MONITOR:   PO intake, Supplement acceptance, Diet advancement, Labs, Weight trends, Skin  REASON FOR ASSESSMENT:   Consult New TPN/TNA  ASSESSMENT:   Caitlin Peters is a 59 y.o. female with medical history significant of hypertension, Crohn's disease s/p short gut syndrome, SBO, on TPN, chronic anemia requiring blood transfusions, chronic pain syndrome, depression, and osteoporosis, and GERD.  She presents with complaints of abdominal pain and diarrhea over the last few days.  Pain is severe located in the lower part of her abdomen with radiation to her back.  She has been having anywhere from 7-8 bowel movements per day with some incontinence.  Stools have been green in color and denies any signs of blood.  Associated symptoms include complaints of fever, nausea, dry heaves, no appetite, and generalized malaise.  Tried utilizing Tylenol for fever and her home pain medications without improvement in symptoms.  Her left femoral catheter was changed 2 days ago and she continues to be on TPN.   Denies any redness or erythema around her catheter line.  Currently being followed by Dr. Tarri Glenn of [facility] and Dr. Domenica Fail at [facility].  Since moving from [place] in November of last year this is the patient's fourth admission into the hospital.  Denies having any dysuria, chest pain, shortness of breath, or cough or any recent falls since December.  Reviewed I/O's: +1.7 L x 24 hours and +2.1 L since  admission  UOP: 1.6 L x 24 hours  Pt receiving nursing care at time of visit. RD unable to obtain further nutrition-related history at this time.   Pt is familiar to this RD due to prior admission. Pt with history of crohn's disease, short gut syndrome, and diarrhea. Pt has had 7 previous bowel resections secondary to crohn's disease. She receives chronic TPN due to malabsorption related to short gut syndrome. Pt consumes a soft det for comfort, however. Of note, pt has relocated to [place] from [place] approximately 4 months ago secondary to functional decline.   Reviewed wt hx; pt has experienced progressive wt gain over the past 3 months.   Per pharmacy note, plan to hold TPN secondary to bacteremia. ID and GI following; pt may need to replace line, however, femoral line placed 2 days PTA and has had issues with previous access.   Albumin has a half-life of 21 days and is strongly affected by stress response and inflammatory process, therefore, do not expect to see an improvement in this lab value during acute hospitalization. When a patient presents with low albumin, it is likely skewed due to the acute inflammatory response.  Unless it is suspected that patient had poor PO intake or malnutrition prior to admission, then RD should not be consulted solely for low albumin. Note that low albumin is no longer used to diagnose malnutrition; [facility] uses the new malnutrition guidelines published by the American Society for Parenteral and Enteral Nutrition (A.S.P.E.N.) and the Academy of Nutrition and Dietetics (AND).    Medications  reviewed and include creon and 0.9% sodium chloride infusion @ 100 ml/hr.   Labs reviewed: Mg: 1.6.   Diet Order:   Diet Order            Diet clear liquid Room service appropriate? Yes; Fluid consistency: Thin  Diet effective now              EDUCATION NEEDS:   No education needs have been identified at this time  Skin:  Skin Assessment: Reviewed RN  Assessment  Last BM:  01/03/20  Height:   Ht Readings from Last 1 Encounters:  01/02/20 5' 8"  (1.727 m)    Weight:   Wt Readings from Last 1 Encounters:  01/02/20 62.4 kg    Ideal Body Weight:  63.6 kg  BMI:  Body mass index is 20.92 kg/m.  Estimated Nutritional Needs:   Kcal:  1950-2150  Protein:  95-110 grams  Fluid:  > 1.9 L    Loistine Chance, RD, LDN, [place] Registered Dietitian II Certified Diabetes Care and Education Specialist Please refer to [facility] for RD and/or RD on-call/weekend/after hours pager

## 2020-01-04 NOTE — Progress Notes (Addendum)
PROGRESS NOTE    Caitlin Peters  AST:[id number] DOB: 04-May-1961 DOA: 01/02/2020 PCP: [name], [name], MD  Brief Narrative:  H/o Crohn's disease s/pshort gut syndrome on home tpn presents to the ED due to fever and ab pain.  Since moving from [place] to [place] in November of last year this is the patient's fourth admission into the hospital. Prior bacteremia with femoral access for TPN.  Assessment & Plan:   Principal Problem:   Crohn's colitis ([place]) Active Problems:   Fever   Short gut syndrome   HTN (hypertension)   GERD (gastroesophageal reflux disease)   Chronic pain syndrome   Acute kidney injury superimposed on chronic kidney disease ([name])   On total parenteral nutrition (TPN)   Elevated liver enzymes  Crohn's colitis, short gut syndrome, chronic diarrhea:  Elevate LFT with mild biliary and pancreatic duct dilatation -Patient presents with fever and complaints of severe lower abdominal pain with radiation into her back and diarrhea. -CT imaging showed mild left sided colitis,  possible Crohn's colitis ? -cdiff negative  -he is currently on entocort, iv zosyn -diet order per GI, will follow GI recommendation -GI please to comment if TPN can be held acutely until negative blood cultures   Recurrent enterobacter Bacteremia Currently Hemodynamically stable, no leukocytosis but presents with  fever   currently on zosyn Source From gi? She also has a left thigh femoral picc that was placed in 08/2019 in [place] (per patient she has not other vascular access left, that is why she had a femoral line placed for home TPN) Hold off TPN for now ID consulted    AKI on CKDIII, anemia of chronic disease HGB stable at baseline  UA no bacteria, no obstructive nephropathy on CT scan cr baseline 1.2, on presentation is 1.6 Continue hydration, repeat BMP in the morning, renal dosing meds  Essential hypertension: Home medications include amlodipine 5 mg daily  and metoprolol 100 mg twice daily. -Continue home regimen  Protein calorie malnutrition: -on chronic TPN due to short gut syndrome -severe, albumin 2.4  Chronic pain syndrome: Patient on Dilaudid 4 mg every 4 hours as needed for pain at home. -Switch to PO Dilaudid home dosing   Pulmonary nodule: Noted to be 6 mm noted in the right lower lobe. Patient reports that she chronically gets these nodules that spontaneously resolved -Recommend repeat follow-up CT outpatient setting 6 months  DVT prophylaxis: Lovenox Code Status: full Family Communication: patient only Disposition Plan:  . Patient came from: home           . Anticipated d/c place: home . Barriers to d/c OR conditions which need to be met to effect a safe d/c: current bacteremia and awaiting repeat blood cultures negative as well as culture sensitivities   Consultants:   GI  ID  Antimicrobials:   Zosyn start date 01/02/20   Subjective: Feeling some improvement. No BM in several days. Does have nausea which is chronic. Denies chest pains or SOB.   Objective: Vitals:   01/03/20 1622 01/03/20 2050 01/04/20 0420 01/04/20 0900  BP: (!) 143/75 (!) 153/65 (!) 148/68 (!) 174/73  Pulse: 66 65 (!) 57 63  Resp: 16 18 16    Temp: 98.8 F (37.1 C) 98.7 F (37.1 C) 98.2 F (36.8 C)   TempSrc: Oral Oral Oral   SpO2: 100% 100% 100%   Weight:      Height:        Intake/Output Summary (Last 24 hours) at 01/04/2020  1210 Last data filed at 01/04/2020 0535 Gross per 24 hour  Intake 3292.1 ml  Output 1200 ml  Net 2092.1 ml   Filed Weights   01/02/20 1428  Weight: 62.4 kg    Examination:  General exam: Appears calm and comfortable  Respiratory system: Clear to auscultation. Respiratory effort normal. Cardiovascular system: S1 & S2 heard, RRR. No JVD, murmurs, rubs, gallops or clicks. No pedal edema. Gastrointestinal system: Abdomen is nondistended, soft and diffuse tender. No organomegaly or masses felt. Normal bowel  sounds heard. Central nervous system: Alert and oriented. No focal neurological deficits. Extremities: Symmetric 5 x 5 power. Diffuse tenderness Skin: No rashes, lesions or ulcers Psychiatry: Judgement and insight appear normal. Mood & affect appropriate.   Data Reviewed: I have personally reviewed following labs and imaging studies  CBC: Recent Labs  Lab 01/02/20 0231 01/03/20 0317 01/04/20 0341  WBC 8.1  8.1 8.1 6.1  NEUTROABS 7.6 6.6 4.4  HGB 8.4*  8.5* 8.0* 7.5*  HCT 26.0*  26.3* 24.6* 24.2*  MCV 94.2  93.3 94.3 96.0  PLT 190  191 116* 694*   Basic Metabolic Panel: Recent Labs  Lab 01/02/20 0231 01/03/20 0317 01/04/20 0341  NA 137 140 139  K 3.5 4.0 4.3  CL 102 108 109  CO2 24 23 21*  GLUCOSE 102* 67* 78  BUN 46* 35* 26*  CREATININE 1.63* 1.66* 1.59*  CALCIUM 9.0 8.8* 8.7*  MG  --  1.7 1.6*  PHOS  --  3.8 3.2   GFR: Estimated Creatinine Clearance: 38 mL/min (A) (by C-G formula based on SCr of 1.59 mg/dL (H)). Liver Function Tests: Recent Labs  Lab 01/02/20 0231 01/03/20 0317 01/04/20 0341  AST 447* 100* 48*  ALT 403* 224* 163*  ALKPHOS 300* 239* 213*  BILITOT 0.9 0.8 0.9  PROT 6.3* 5.6* 5.9*  ALBUMIN 2.7* 2.4* 2.3*   Recent Labs  Lab 01/02/20 0231 01/03/20 0317  LIPASE 16 13   No results for input(s): AMMONIA in the last 168 hours. Coagulation Profile: Recent Labs  Lab 01/02/20 1139  INR 1.1   Cardiac Enzymes: No results for input(s): CKTOTAL, CKMB, CKMBINDEX, TROPONINI in the last 168 hours. BNP (last 3 results) No results for input(s): PROBNP in the last 8760 hours. HbA1C: No results for input(s): HGBA1C in the last 72 hours. CBG: No results for input(s): GLUCAP in the last 168 hours. Lipid Profile: Recent Labs    01/03/20 0317 01/04/20 0341  TRIG 298* 214*   Thyroid Function Tests: No results for input(s): TSH, T4TOTAL, FREET4, T3FREE, THYROIDAB in the last 72 hours. Anemia Panel: No results for input(s): VITAMINB12,  FOLATE, FERRITIN, TIBC, IRON, RETICCTPCT in the last 72 hours. Sepsis Labs: Recent Labs  Lab 01/02/20 0734  LATICACIDVEN 0.5    Recent Results (from the past 240 hour(s))  Blood culture (routine x 2)     Status: Abnormal (Preliminary result)   Collection Time: 01/02/20  5:30 AM   Specimen: BLOOD  Result Value Ref Range Status   Specimen Description BLOOD RIGHT ANTECUBITAL  Final   Special Requests   Final    BOTTLES DRAWN AEROBIC AND ANAEROBIC Blood Culture results may not be optimal due to an inadequate volume of blood received in culture bottles   Culture  Setup Time   Final    GRAM NEGATIVE RODS IN BOTH AEROBIC AND ANAEROBIC BOTTLES CRITICAL RESULT CALLED TO, READ BACK BY AND VERIFIED WITH: PHARMD LYDIA C. [id number] FCP    Culture (A)  Final    ENTEROBACTER CLOACAE SUSCEPTIBILITIES TO FOLLOW Performed at [facility], [place] [address], [place], [place] [address]    Report Status PENDING  Incomplete  Blood culture (routine x 2)     Status: None (Preliminary result)   Collection Time: 01/02/20  5:30 AM   Specimen: BLOOD  Result Value Ref Range Status   Specimen Description BLOOD LEFT ANTECUBITAL  Final   Special Requests   Final    BOTTLES DRAWN AEROBIC AND ANAEROBIC Blood Culture adequate volume   Culture  Setup Time   Final    GRAM NEGATIVE RODS IN BOTH AEROBIC AND ANAEROBIC BOTTLES    Culture   Final    GRAM NEGATIVE RODS IDENTIFICATION AND SUSCEPTIBILITIES TO FOLLOW Performed at [facility], [place] [address], [place], [place] [address]    Report Status PENDING  Incomplete  Blood Culture ID Panel (Reflexed)     Status: Abnormal   Collection Time: 01/02/20  5:30 AM  Result Value Ref Range Status   Enterococcus species NOT DETECTED NOT DETECTED Final   Listeria monocytogenes NOT DETECTED NOT DETECTED Final   Staphylococcus species NOT DETECTED NOT DETECTED Final   Staphylococcus aureus (BCID) NOT DETECTED NOT DETECTED Final   Streptococcus species NOT  DETECTED NOT DETECTED Final   Streptococcus agalactiae NOT DETECTED NOT DETECTED Final   Streptococcus pneumoniae NOT DETECTED NOT DETECTED Final   Streptococcus pyogenes NOT DETECTED NOT DETECTED Final   Acinetobacter baumannii NOT DETECTED NOT DETECTED Final   Enterobacteriaceae species DETECTED (A) NOT DETECTED Final    Comment: Enterobacteriaceae represent a large family of gram-negative bacteria, not a single organism. CRITICAL RESULT CALLED TO, READ BACK BY AND VERIFIED WITH: PHARMD LYDIA C. [id number] FCP    Enterobacter cloacae complex DETECTED (A) NOT DETECTED Final    Comment: CRITICAL RESULT CALLED TO, READ BACK BY AND VERIFIED WITH: PHARMD LYDIA C. [id number] FCP    Escherichia coli NOT DETECTED NOT DETECTED Final   Klebsiella oxytoca NOT DETECTED NOT DETECTED Final   Klebsiella pneumoniae NOT DETECTED NOT DETECTED Final   Proteus species NOT DETECTED NOT DETECTED Final   Serratia marcescens NOT DETECTED NOT DETECTED Final   Carbapenem resistance NOT DETECTED NOT DETECTED Final   Haemophilus influenzae NOT DETECTED NOT DETECTED Final   Neisseria meningitidis NOT DETECTED NOT DETECTED Final   Pseudomonas aeruginosa NOT DETECTED NOT DETECTED Final   Candida albicans NOT DETECTED NOT DETECTED Final   Candida glabrata NOT DETECTED NOT DETECTED Final   Candida krusei NOT DETECTED NOT DETECTED Final   Candida parapsilosis NOT DETECTED NOT DETECTED Final   Candida tropicalis NOT DETECTED NOT DETECTED Final    Comment: Performed at  Hospital Lab, [place] [address] [place], [place] [address]  Novel Coronavirus, NAA (Hosp order, Send-out to Ref Lab; TAT 18-24 hrs     Status: None   Collection Time: 01/02/20  7:48 AM   Specimen: Nasopharyngeal Swab; Respiratory  Result Value Ref Range Status   SARS-CoV-2, NAA NOT DETECTED NOT DETECTED Final    Comment: (NOTE) This nucleic acid amplification test was developed and its performance characteristics determined by [employer]. Nucleic acid amplification tests include RT-PCR and TMA. This test has not been FDA cleared or approved. This test has been authorized by FDA under an Emergency Use Authorization (EUA). This test is only authorized for the duration of time the declaration that circumstances exist justifying the authorization of the emergency use of in vitro diagnostic  tests for detection of SARS-CoV-2 virus and/or diagnosis of COVID-19 infection under section 564(b)(1) of the Act, 21 U.S.C. 097DZH-2(D) (1), unless the authorization is terminated or revoked sooner. When diagnostic testing is negative, the possibility of a false negative result should be considered in the context of a patient's recent exposures and the presence of clinical signs and symptoms consistent with COVID-19. An individual without symptoms of COVID- 19 and who is not shedding SARS-CoV-2  virus would expect to have a negative (not detected) result in this assay. Performed At: [facility] [address] [place], [place] [id number] Rush Farmer MD DQ:[id number]    [place]  Final    Comment: Performed at [facility], [place] [address], [place], [place] [address]  C difficile quick scan w PCR reflex     Status: None   Collection Time: 01/03/20 12:11 AM   Specimen: STOOL  Result Value Ref Range Status   C Diff antigen NEGATIVE NEGATIVE Final   C Diff toxin NEGATIVE NEGATIVE Final   C Diff interpretation No C. difficile detected.  Final    Comment: Performed at [facility], [place] [address] [place],  [address]     Radiology Studies: MR ABDOMEN MRCP WO CONTRAST  Result Date: 01/03/2020 CLINICAL DATA:  Elevated liver enzymes.  Mild biliary distention. EXAM: MRI ABDOMEN WITHOUT CONTRAST  (INCLUDING MRCP) TECHNIQUE: Multiplanar multisequence MR imaging of the abdomen was performed. Heavily T2-weighted images of the biliary and pancreatic ducts were obtained, and  three-dimensional MRCP images were rendered by post processing. COMPARISON:  CT scan 01/02/2020.  CT scan 11/25/2019. FINDINGS: Lower chest: Heart size upper normal. Hepatobiliary: Liver measures 20 cm craniocaudal length. No focal abnormality in the liver parenchyma on this noncontrast study. Gallbladder surgically absent. Only minimal prominence of the intrahepatic bile ducts with extrahepatic common duct measuring 9 mm diameter in the hepatoduodenal ligament. Common bile duct measures 10 mm in the head of the pancreas. Overall, the intra and extrahepatic biliary ducts are decreased in distention when comparing back to the CT of 11/25/2019. The common bile duct was 14 mm in diameter when remeasured in the same dimension at the same level on that study. No evidence for choledocholithiasis. Pancreas: As before, the main pancreatic duct is dilated throughout its length from the tail to the ampulla. Dilatation measures in the 5-6 mm range, also decrease since 11/25/2019 when I remeasure at a similar level at 8 mm. No focal mass lesion noted within the pancreatic parenchyma on this noncontrast motion degraded study. Spleen: Upper normal at 12.5 cm craniocaudal length. No focal abnormality. Adrenals/Urinary Tract: No adrenal nodule or mass. Kidneys unremarkable. Stomach/Bowel: Stomach is nondistended. No obvious small bowel dilatation. Colon in the left abdomen is distended, better demonstrated on recent CT scan. Vascular/Lymphatic: No abdominal aortic aneurysm. No bulky abdominal lymphadenopathy evident. Other:  No intraperitoneal free fluid. Musculoskeletal: No abnormal marrow signal within the visualized bony anatomy. IMPRESSION: 1. Motion degraded study, obscuring fine detail. 2. Mild intra and extrahepatic biliary duct dilatation to the level of the ampulla. The degree of distention has decreased when comparing to CT scan of 11/25/2019. No choledocholithiasis. No obstructing mass lesion evident on this noncontrast  motion degraded study. 3. Diffuse dilatation of the main pancreatic duct throughout its length again noted. Distention has also decreased when comparing to 11/25/2019. Pancreatic duct distention extends from the tail to the ampulla. No intraluminal stone or obstructing lesion evident. 4. Hepatomegaly Electronically Signed   By: Randall Hiss  Tery Sanfilippo M.D.   On: 01/03/2020 09:20   Scheduled Meds: . acidophilus  1 capsule Oral Daily  . amLODipine  5 mg Oral Daily  . budesonide  9 mg Oral Daily  . buPROPion  200 mg Oral Daily  . Chlorhexidine Gluconate Cloth  6 each Topical Daily  . cycloSPORINE  1 drop Both Eyes BID  . dicyclomine  20 mg Oral TID  . DULoxetine  90 mg Oral Daily  . enoxaparin (LOVENOX) injection  40 mg Subcutaneous Q24H  . estradiol  2 mg Oral Daily  . famotidine  20 mg Oral QHS  . hydroxypropyl methylcellulose / hypromellose  1 drop Both Eyes QID  . lipase/protease/amylase  108,000 Units Oral TID AC  . metoprolol tartrate  100 mg Oral BID  . pantoprazole  40 mg Oral Daily  . potassium chloride SA  40 mEq Oral Daily  . sodium chloride flush  3 mL Intravenous Q12H  . sucralfate  1 g Oral TID WC & HS   Continuous Infusions: . sodium chloride 100 mL/hr at 01/04/20 1028  . magnesium sulfate bolus IVPB       LOS: 2 days   Time spent: 25  Hoyt Koch, MD [facility]  To contact the attending provider between 7A-7P or the covering provider during after hours 7P-7A, please log into the web site [email or web address] and access using universal [place] password for that web site. If you do not have the password, please call the hospital operator.  01/04/2020, 12:10 PM

## 2020-01-05 ENCOUNTER — Encounter (HOSPITAL_COMMUNITY): Payer: Self-pay | Admitting: Internal Medicine

## 2020-01-05 ENCOUNTER — Telehealth: Payer: Self-pay | Admitting: Gastroenterology

## 2020-01-05 DIAGNOSIS — R10814 Left lower quadrant abdominal tenderness: Secondary | ICD-10-CM

## 2020-01-05 DIAGNOSIS — K50119 Crohn's disease of large intestine with unspecified complications: Secondary | ICD-10-CM

## 2020-01-05 DIAGNOSIS — R10816 Epigastric abdominal tenderness: Secondary | ICD-10-CM

## 2020-01-05 DIAGNOSIS — R10815 Periumbilic abdominal tenderness: Secondary | ICD-10-CM

## 2020-01-05 LAB — COMPREHENSIVE METABOLIC PANEL
ALT: 119 U/L — ABNORMAL HIGH (ref 0–44)
AST: 27 U/L (ref 15–41)
Albumin: 2.3 g/dL — ABNORMAL LOW (ref 3.5–5.0)
Alkaline Phosphatase: 232 U/L — ABNORMAL HIGH (ref 38–126)
Anion gap: 9 (ref 5–15)
BUN: 14 mg/dL (ref 6–20)
CO2: 23 mmol/L (ref 22–32)
Calcium: 8.9 mg/dL (ref 8.9–10.3)
Chloride: 107 mmol/L (ref 98–111)
Creatinine, Ser: 1.53 mg/dL — ABNORMAL HIGH (ref 0.44–1.00)
GFR calc Af Amer: 43 mL/min — ABNORMAL LOW (ref >60)
GFR calc non Af Amer: 37 mL/min — ABNORMAL LOW (ref >60)
Glucose, Bld: 96 mg/dL (ref 70–99)
Potassium: 4 mmol/L (ref 3.5–5.1)
Sodium: 139 mmol/L (ref 135–145)
Total Bilirubin: 1.1 mg/dL (ref 0.3–1.2)
Total Protein: 6.2 g/dL — ABNORMAL LOW (ref 6.5–8.1)

## 2020-01-05 LAB — CULTURE, BLOOD (ROUTINE X 2): Special Requests: ADEQUATE

## 2020-01-05 LAB — GLUCOSE, CAPILLARY
Glucose-Capillary: 90 mg/dL (ref 70–99)
Glucose-Capillary: 118 mg/dL — ABNORMAL HIGH (ref 70–99)

## 2020-01-05 LAB — PHOSPHORUS: Phosphorus: 2.9 mg/dL (ref 2.5–4.6)

## 2020-01-05 LAB — MAGNESIUM: Magnesium: 1.8 mg/dL (ref 1.7–2.4)

## 2020-01-05 MED ORDER — SODIUM CHLORIDE 0.9 % IV SOLN
1.0000 g | INTRAVENOUS | Status: DC
  Administered 2020-01-05 – 2020-01-11 (×7): 1000 mg via INTRAVENOUS
  Filled 2020-01-05 (×7): qty 1

## 2020-01-05 MED ORDER — TRAVASOL 10 % IV SOLN
INTRAVENOUS | Status: AC
  Filled 2020-01-05: qty 499.2

## 2020-01-05 MED ORDER — HYDROMORPHONE HCL 1 MG/ML IJ SOLN
1.0000 mg | INTRAMUSCULAR | Status: DC | PRN
  Administered 2020-01-05: 2 mg via INTRAVENOUS
  Administered 2020-01-05: 1 mg via INTRAVENOUS
  Administered 2020-01-05 – 2020-01-06 (×6): 2 mg via INTRAVENOUS
  Administered 2020-01-06 – 2020-01-07 (×3): 1 mg via INTRAVENOUS
  Filled 2020-01-05 (×2): qty 2
  Filled 2020-01-05 (×2): qty 1
  Filled 2020-01-05 (×3): qty 2
  Filled 2020-01-05: qty 1
  Filled 2020-01-05 (×3): qty 2

## 2020-01-05 NOTE — Progress Notes (Signed)
[facility] for Infectious Disease  Date of Admission:  01/02/2020     Total days of antibiotics 4         ASSESSMENT:  Ms. Pingley has Enterobacter cloacae bacteremia with concern for femoral line seeding. She has limited venous access as she has been receiving chronic TPN. Sensitivities shows resistance to only to Cefazolin, however with increased risk of AmpC development with ceftriaxone will change piperacillin-tazobactam to ertapenem. She will need prolonged IV therapy and would be ideal for catheter exchange given limited site options with 2 lumen cathter given need to TPN and antimicrobial therapy. Current cultures have been negative for at least the last 24 hours and would be okay to replace cathter in the next 24-48 hours if cultures remain negative. Ok to resume TPN after next dose of ertapenem with possible cathter exchange. [facility] has confirmed they are able to perform ethanol locks if needed.   PLAN:  1. Change piperacillin-tazobactam to ertapenem.  2. Monitor cultures for continued clearance of bacteremia. 3. Exchange cathter once bacteremia is cleared in the next 24-48 hours.   Principal Problem:   Crohn's colitis ([place]) Active Problems:   Fever   Short gut syndrome   HTN (hypertension)   GERD (gastroesophageal reflux disease)   Chronic pain syndrome   Acute kidney injury superimposed on chronic kidney disease ([name])   On total parenteral nutrition (TPN)   Elevated liver enzymes   Gram-negative bacteremia   Infection of peripherally inserted central venous catheter (PICC)   . acidophilus  1 capsule Oral Daily  . amLODipine  5 mg Oral Daily  . budesonide  9 mg Oral Daily  . buPROPion  200 mg Oral Daily  . Chlorhexidine Gluconate Cloth  6 each Topical Daily  . cycloSPORINE  1 drop Both Eyes BID  . dicyclomine  20 mg Oral TID  . DULoxetine  90 mg Oral Daily  . enoxaparin (LOVENOX) injection  40 mg Subcutaneous Q24H  . estradiol  2 mg Oral Daily    . famotidine  20 mg Oral QHS  . feeding supplement  1 Container Oral TID BM  . hydroxypropyl methylcellulose / hypromellose  1 drop Both Eyes QID  . lipase/protease/amylase  108,000 Units Oral TID AC  . metoprolol tartrate  100 mg Oral BID  . pantoprazole  40 mg Oral Daily  . potassium chloride SA  40 mEq Oral Daily  . sodium chloride flush  3 mL Intravenous Q12H  . sucralfate  1 g Oral TID WC & HS    SUBJECTIVE:  Afebrile overnight with increased pain with increased liquid intake as well as increase bowel movements.   Allergies  Allergen Reactions  . Cefepime Other (See Comments)    Neurotoxicity occurring in setting of AKI. Ceftriaxone tolerated during same admit  . Gabapentin Other (See Comments)    unknown  . Hyoscyamine Hives and Swelling    Legs swelling   Disorientation  . Lyrica [Pregabalin] Other (See Comments)    unknown  . Meperidine Hives    Other reaction(s): GI Upset Due to [place]   . Topamax [Topiramate] Other (See Comments)    unknown  . Fentanyl Rash  . Morphine And Related Rash     Review of Systems: Review of Systems  Constitutional: Negative for chills, fever and weight loss.  Respiratory: Negative for cough, shortness of breath and wheezing.   Cardiovascular: Negative for chest pain and leg swelling.  Gastrointestinal: Negative for abdominal pain, constipation, diarrhea,  nausea and vomiting.  Skin: Negative for rash.    OBJECTIVE: Vitals:   01/04/20 1655 01/04/20 2234 01/05/20 0409 01/05/20 1000  BP: (!) 153/68 (!) 156/77 (!) 172/70   Pulse: 63 (!) 56 (!) 56   Resp: 16 16 19    Temp: 98.9 F (37.2 C) 98.7 F (37.1 C) 99.5 F (37.5 C)   TempSrc: Oral Oral Oral   SpO2: 100% 100% 99%   Weight:    60.1 kg  Height:       Body mass index is 20.16 kg/m.  Physical Exam Constitutional:      General: She is not in acute distress.    Appearance: She is well-developed.     Comments: Lying in bed with head of bed elevated; pleasant.    Cardiovascular:     Rate and Rhythm: Normal rate and regular rhythm.     Heart sounds: Normal heart sounds.  Pulmonary:     Effort: Pulmonary effort is normal.     Breath sounds: Normal breath sounds.  Abdominal:     Tenderness: There is abdominal tenderness in the epigastric area, periumbilical area and left lower quadrant.  Skin:    General: Skin is warm and dry.  Neurological:     Mental Status: She is alert and oriented to person, place, and time.  Psychiatric:        Behavior: Behavior normal.        Thought Content: Thought content normal.        Judgment: Judgment normal.     Lab Results Lab Results  Component Value Date   WBC 6.1 01/04/2020   HGB 7.5 (L) 01/04/2020   HCT 24.2 (L) 01/04/2020   MCV 96.0 01/04/2020   PLT 144 (L) 01/04/2020    Lab Results  Component Value Date   CREATININE 1.53 (H) 01/05/2020   BUN 14 01/05/2020   NA 139 01/05/2020   K 4.0 01/05/2020   CL 107 01/05/2020   CO2 23 01/05/2020    Lab Results  Component Value Date   ALT 119 (H) 01/05/2020   AST 27 01/05/2020   ALKPHOS 232 (H) 01/05/2020   BILITOT 1.1 01/05/2020     Microbiology: Recent Results (from the past 240 hour(s))  Blood culture (routine x 2)     Status: Abnormal   Collection Time: 01/02/20  5:30 AM   Specimen: BLOOD  Result Value Ref Range Status   Specimen Description BLOOD RIGHT ANTECUBITAL  Final   Special Requests   Final    BOTTLES DRAWN AEROBIC AND ANAEROBIC Blood Culture results may not be optimal due to an inadequate volume of blood received in culture bottles   Culture  Setup Time   Final    GRAM NEGATIVE RODS IN BOTH AEROBIC AND ANAEROBIC BOTTLES CRITICAL RESULT CALLED TO, READ BACK BY AND VERIFIED WITH: PHARMD LYDIA C. [phone] FCP Performed at [facility], [place] [address], [place], [place] [address]    Culture ENTEROBACTER CLOACAE (A)  Final   Report Status 01/05/2020 FINAL  Final   Organism ID, Bacteria ENTEROBACTER CLOACAE  Final       Susceptibility   Enterobacter cloacae - MIC*    CEFAZOLIN >=64 RESISTANT Resistant     CEFEPIME <=0.12 SENSITIVE Sensitive     CEFTAZIDIME <=1 SENSITIVE Sensitive     CIPROFLOXACIN <=0.25 SENSITIVE Sensitive     GENTAMICIN <=1 SENSITIVE Sensitive     IMIPENEM <=0.25 SENSITIVE Sensitive     TRIMETH/SULFA <=20 SENSITIVE Sensitive  PIP/TAZO 8 SENSITIVE Sensitive     * ENTEROBACTER CLOACAE  Blood culture (routine x 2)     Status: Abnormal   Collection Time: 01/02/20  5:30 AM   Specimen: BLOOD  Result Value Ref Range Status   Specimen Description BLOOD LEFT ANTECUBITAL  Final   Special Requests   Final    BOTTLES DRAWN AEROBIC AND ANAEROBIC Blood Culture adequate volume   Culture  Setup Time   Final    GRAM NEGATIVE RODS IN BOTH AEROBIC AND ANAEROBIC BOTTLES    Culture (A)  Final    ENTEROBACTER CLOACAE SUSCEPTIBILITIES PERFORMED ON PREVIOUS CULTURE WITHIN THE LAST 5 DAYS. Performed at [facility], [place] [address] [place], [place] [address]    Report Status 01/05/2020 FINAL  Final  Blood Culture ID Panel (Reflexed)     Status: Abnormal   Collection Time: 01/02/20  5:30 AM  Result Value Ref Range Status   Enterococcus species NOT DETECTED NOT DETECTED Final   Listeria monocytogenes NOT DETECTED NOT DETECTED Final   Staphylococcus species NOT DETECTED NOT DETECTED Final   Staphylococcus aureus (BCID) NOT DETECTED NOT DETECTED Final   Streptococcus species NOT DETECTED NOT DETECTED Final   Streptococcus agalactiae NOT DETECTED NOT DETECTED Final   Streptococcus pneumoniae NOT DETECTED NOT DETECTED Final   Streptococcus pyogenes NOT DETECTED NOT DETECTED Final   Acinetobacter baumannii NOT DETECTED NOT DETECTED Final   Enterobacteriaceae species DETECTED (A) NOT DETECTED Final    Comment: Enterobacteriaceae represent a large family of gram-negative bacteria, not a single organism. CRITICAL RESULT CALLED TO, READ BACK BY AND VERIFIED WITH: PHARMD LYDIA C. [phone] FCP      Enterobacter cloacae complex DETECTED (A) NOT DETECTED Final    Comment: CRITICAL RESULT CALLED TO, READ BACK BY AND VERIFIED WITH: PHARMD LYDIA C. [phone] FCP    Escherichia coli NOT DETECTED NOT DETECTED Final   Klebsiella oxytoca NOT DETECTED NOT DETECTED Final   Klebsiella pneumoniae NOT DETECTED NOT DETECTED Final   Proteus species NOT DETECTED NOT DETECTED Final   Serratia marcescens NOT DETECTED NOT DETECTED Final   Carbapenem resistance NOT DETECTED NOT DETECTED Final   Haemophilus influenzae NOT DETECTED NOT DETECTED Final   Neisseria meningitidis NOT DETECTED NOT DETECTED Final   Pseudomonas aeruginosa NOT DETECTED NOT DETECTED Final   Candida albicans NOT DETECTED NOT DETECTED Final   Candida glabrata NOT DETECTED NOT DETECTED Final   Candida krusei NOT DETECTED NOT DETECTED Final   Candida parapsilosis NOT DETECTED NOT DETECTED Final   Candida tropicalis NOT DETECTED NOT DETECTED Final    Comment: Performed at [facility], [place] [address] [place], [place] [address]  Novel Coronavirus, NAA (Hosp order, Send-out to Ref Lab; TAT 18-24 hrs     Status: None   Collection Time: 01/02/20  7:48 AM   Specimen: Nasopharyngeal Swab; Respiratory  Result Value Ref Range Status   SARS-CoV-2, NAA NOT DETECTED NOT DETECTED Final    Comment: (NOTE) This nucleic acid amplification test was developed and its performance characteristics determined by [employer]. Nucleic acid amplification tests include RT-PCR and TMA. This test has not been FDA cleared or approved. This test has been authorized by FDA under an Emergency Use Authorization (EUA). This test is only authorized for the duration of time the declaration that circumstances exist justifying the authorization of the emergency use of in vitro diagnostic tests for detection of SARS-CoV-2 virus and/or diagnosis of COVID-19 infection under section 564(b)(1) of the  Act, 21 U.S.C. 902IOX-7(D) (1), unless the  authorization is terminated or revoked sooner. When diagnostic testing is negative, the possibility of a false negative result should be considered in the context of a patient's recent exposures and the presence of clinical signs and symptoms consistent with COVID-19. An individual without symptoms of COVID- 19 and who is not shedding SARS-CoV-2  virus would expect to have a negative (not detected) result in this assay. Performed At: [facility] [address] [place], [place] [id number] Rush Farmer MD ST:[id number]    [place]  Final    Comment: Performed at [facility], [place] [address], [place], [place] [address]  C difficile quick scan w PCR reflex     Status: None   Collection Time: 01/03/20 12:11 AM   Specimen: STOOL  Result Value Ref Range Status   C Diff antigen NEGATIVE NEGATIVE Final   C Diff toxin NEGATIVE NEGATIVE Final   C Diff interpretation No C. difficile detected.  Final    Comment: Performed at [facility], [place] [address] [place], [place] [address]  Culture, blood (Routine X 2) w Reflex to ID Panel     Status: None (Preliminary result)   Collection Time: 01/04/20  6:47 AM   Specimen: BLOOD RIGHT HAND  Result Value Ref Range Status   Specimen Description BLOOD RIGHT HAND  Final   Special Requests   Final    BOTTLES DRAWN AEROBIC ONLY Blood Culture results may not be optimal due to an inadequate volume of blood received in culture bottles   Culture   Final    NO GROWTH 1 DAY Performed at [facility], [place] [address] [place], [place] [address]    Report Status PENDING  Incomplete     Terri Piedra, [place] [facility] [phone] Pager  01/05/2020  1:13 PM

## 2020-01-05 NOTE — Telephone Encounter (Signed)
Caitlin Peters from [facility] is calling saying they have not yet received any orders for this patient.

## 2020-01-05 NOTE — Telephone Encounter (Signed)
Spoke to Caitlin Peters again to tell them Dr. Tarri Glenn did not see the patient until 11/24/20. I told them, I could not find in the chart where Dr. Tarri Glenn had any contact with the patient while admitted.

## 2020-01-05 NOTE — Progress Notes (Addendum)
Progress Note    ASSESSMENT AND PLAN:   46. 59 yo female with complex medical history . She has Crohn's disease s/p multiple bowel surgeries / short gut on chronic TPN. Maintained on Entocort. We have referred her to [facility] for management. Admitted with lower abdominal pain. CT scan suggests possibly mild focal left colitis. She had same findings on CT scan mid January so not clear how / why this would cause such an exacerbation in pain. She did have diarrhea but C-diff negative. Her bowel movements are now at baseline though she continues to have associated lower abdominal pain.    2. Abnormal liver tests. - Admitted late December with concern for ascending cholangitis. 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 Underwent sphincterotomy, sphincteroplasty and balloon extraction.. No evidence for PSC stricture. Liver tests normalized. Now with recurrent elevation in liver tests ( enzymes in 400s). MRCP >> mild intra / extrahepatic biliary duct dilation to level of ampulla and diffusely dilated PD. Liver tests improving. She may have passed a stone which could account for abnormal liver tests and bacteremia.  -No ERCP planned. May need EUS at some point to further evaluate bile duct dilation -Doubt enterobacter secondary to "colitis" on CT scan  3. Enterobacter bacteremia - source? Passed biliary stone maybe or from LLE femoral line?  SUBJECTIVE    Still having LLQ pain associated with but not improved with bowel movements.    OBJECTIVE:     Vital signs in last 24 hours: Temp:  [98.7 F (37.1 C)-99.5 F (37.5 C)] 99.5 F (37.5 C) (02/09 0409) Pulse Rate:  [56-63] 56 (02/09 0409) Resp:  [16-19] 19 (02/09 0409) BP: (153-172)/(68-77) 172/70 (02/09 0409) SpO2:  [99 %-100 %] 99 % (02/09 0409) Last BM Date: 01/05/20 General:   Alert, well-developed female in NAD EENT:  Normal hearing, non icteric sclera   Heart:  Regular rate and rhythm;  No lower extremity  edema   Pulm: Normal respiratory effort   Abdomen:  Soft, nondistended, diffuse lower abdominal tenderness. Normal bowel sounds.          Neurologic:  Alert and  oriented x4;  grossly normal neurologically. Psych:  Pleasant, cooperative.  Normal mood and affect.   Intake/Output from previous day: 02/08 0701 - 02/09 0700 In: 3270.7 [P.O.:600; I.V.:2527.2; IV Piggyback:143.5] Out: 3000 [Urine:3000] Intake/Output this shift: No intake/output data recorded.  Lab Results: Recent Labs    01/03/20 0317 01/04/20 0341  WBC 8.1 6.1  HGB 8.0* 7.5*  HCT 24.6* 24.2*  PLT 116* 144*   BMET Recent Labs    01/03/20 0317 01/04/20 0341 01/05/20 0500  NA 140 139 139  K 4.0 4.3 4.0  CL 108 109 107  CO2 23 21* 23  GLUCOSE 67* 78 96  BUN 35* 26* 14  CREATININE 1.66* 1.59* 1.53*  CALCIUM 8.8* 8.7* 8.9   LFT Recent Labs    01/05/20 0500  PROT 6.2*  ALBUMIN 2.3*  AST 27  ALT 119*  ALKPHOS 232*  BILITOT 1.1   PT/INR Recent Labs    01/02/20 1139  LABPROT 13.6  INR 1.1   Hepatitis Panel Recent Labs    01/02/20 1139  HEPBSAG NON REACTIVE  HCVAB NON REACTIVE  HEPAIGM NON REACTIVE  HEPBIGM NON REACTIVE    No results found.   Principal Problem:   Crohn's colitis ([place]) Active Problems:   Fever   Short gut syndrome   HTN (hypertension)   GERD (gastroesophageal reflux disease)  Chronic pain syndrome   Acute kidney injury superimposed on chronic kidney disease ([name])   On total parenteral nutrition (TPN)   Elevated liver enzymes   Gram-negative bacteremia   Infection of peripherally inserted central venous catheter (PICC)     LOS: 3 days   Caitlin Peters ,NP 01/05/2020, 10:33 AM  I have discussed the case with the PA, and that is the plan I formulated. I personally interviewed and examined the patient.  Bacteremia is under control, clinically her systemic infection is improved.  She is on appropriate antibiotic therapy with assistance of the infectious disease  service.  Plan is for a central line catheter change prior to discharge.  She has chronic lower abdominal pain and diarrhea related to her longstanding complex Crohn's disease with multiple prior surgeries and short gut syndrome.  Subspecialty care with an IBD physician at academic center continue after discharge, and she will be seen locally by Dr. Tarri Glenn at our practice.  Transaminases steadily improving, alkaline phosphatase relatively stagnant, MRI/MRCP did not show any apparent choledocholithiasis this admission.  Clinical suspicion is that biliary sludge in a tortuous common bile duct caused transient bacteremia before finally passing/clearing.  LFTs recently normalized after the hospitalization with choledocholithiasis relieved by ERCP, indicating that she does not appear to have baseline elevation of LFTs.  No ERCP is planned at present.  We have no further testing nor change in treatment to recommend at this point.  Therefore, will sign off and help arrange follow-up when the patient is ready for discharge.  Total time 25 minutes  Nelida Meuse III Office: [phone]

## 2020-01-05 NOTE — Progress Notes (Signed)
PROGRESS NOTE    Caitlin Peters  MLY:[id number] DOB: 27-Jan-1961 DOA: 01/02/2020 PCP: [name], [name], MD   Brief Narrative:  H/oCrohn's disease s/pshort gut syndromeon home tpn presents to the ED due to fever and ab pain.  Since [facility] [place] [facility] of last year this is the patient's fourth admission into the hospital.Prior bacteremia with femoral access for TPN.  Interim history: Found to be bacteremic, awaiting 1 more day negative repeat blood cultures for line replacement and ethanol capping per ID. ID and GI following. Overall stable.   Assessment & Plan:   Principal Problem:   Crohn's colitis ([place]) Active Problems:   Fever   Short gut syndrome   HTN (hypertension)   GERD (gastroesophageal reflux disease)   Chronic pain syndrome   Acute kidney injury superimposed on chronic kidney disease ([name])   On total parenteral nutrition (TPN)   Elevated liver enzymes   Gram-negative bacteremia   Infection of peripherally inserted central venous catheter (PICC)  Crohn's colitis, short gut syndrome, chronic diarrhea: Elevate LFT with mild biliary and pancreatic duct dilatation -Patient presents withfever andcomplaints of severe lower abdominal pain with radiation into her back and diarrhea. -CT imagingshowed mild left sided colitis,possible Crohn's colitis ? -cdiff negative  -he is currently on entocort, iv zosyn -diet order per GI,will follow GI recommendation  RecurrententerobacterBacteremia CurrentlyHemodynamically stable,no leukocytosis butpresents withfever  currently on zosyn SourceFrom gi? She also has a left thigh femoral picc that was placed in 08/2019 in [place](per patient she has not other vascular access left, that is why she had a femoral line placed for home TPN) Resume TPN today as repeat blood culture negative 1 day so far ID consultedwill likely need line replacement 01/06/20 assuming repeat blood  cultures still negative then and may do ethanol capping to help also She has failed oral suppressive therapy in the past  AKI on CKDIII,anemia of chronic disease HGB stable at baseline UA no bacteria,no obstructive nephropathyonCT scan crbaseline 1.2,on presentation is 1.6 Continue hydration, repeat BMP in the morning,renal dosing meds  Essential hypertension: Home medications include amlodipine 5 mg daily and metoprolol 100 mg twice daily. -Continue home regimen  Protein calorie malnutrition: -on chronic TPN due to short gut syndrome -severe, albumin 2.4 -resume home TPN today  Chronic pain syndrome: Patient on Dilaudid 4 mg every 4 hours as needed for pain at home. -Switch back to prior IV dilaudid 1-2 mg q 4 hour dosing as she did not do well with oral dilaudid yesterday even with breakthrough IV dilaudid   Pulmonary nodule: Noted to be 6 mm noted in the right lower lobe. Patient reports that she chronically gets these nodules that spontaneously resolved -Recommend repeat follow-up CT outpatient setting 6 months  DVT prophylaxis:lovenox Code Status: full Family Communication: patient only Disposition Plan:  . Patient came from: home           . Anticipated d/c place: home . Barriers to d/c OR conditions which need to be met to effect a safe d/c: needs negative blood cultures for line replacement hopefully 01/06/20 with ethanol capping, then will be stable for discharge   Consultants:   ID  GI  Antimicrobials:   Zosyn start date 01/02/20, stop date 01/05/20  Ertapenem start date 01/05/20  Subjective: Feeling more in pain today, did not appreciate going to home dosing of pain meds. Had 10 episodes diarrhea yesterday with trying clear liquids. Trying some red popsicle this morning and no problems yet. Does  have abdominal pain from the diarrhea yesterday diffuse. Back hurting more than usual. Denies chest pains or SOB. Denies fevers or chills.    Objective: Vitals:   01/04/20 1655 01/04/20 2234 01/05/20 0409 01/05/20 1000  BP: (!) 153/68 (!) 156/77 (!) 172/70   Pulse: 63 (!) 56 (!) 56   Resp: 16 16 19    Temp: 98.9 F (37.2 C) 98.7 F (37.1 C) 99.5 F (37.5 C)   TempSrc: Oral Oral Oral   SpO2: 100% 100% 99%   Weight:    60.1 kg  Height:        Intake/Output Summary (Last 24 hours) at 01/05/2020 1131 Last data filed at 01/05/2020 1050 Gross per 24 hour  Intake 3670.7 ml  Output 3000 ml  Net 670.7 ml   Filed Weights   01/02/20 1428 01/05/20 1000  Weight: 62.4 kg 60.1 kg    Examination:  General exam: Appears calm and comfortable, some discomfort Respiratory system: Clear to auscultation. Respiratory effort normal. Cardiovascular system: S1 & S2 heard, RRR. No JVD, murmurs, rubs, gallops or clicks. No pedal edema. Gastrointestinal system: Abdomen is nondistended, soft and diffusely tender. No organomegaly or masses felt. Normal bowel sounds heard. Central nervous system: Alert and oriented. No focal neurological deficits. Extremities: Symmetric 5 x 5 power. Skin: No rashes, lesions or ulcers Psychiatry: Judgement and insight appear normal. Mood & affect appropriate.   Data Reviewed: I have personally reviewed following labs and imaging studies  CBC: Recent Labs  Lab 01/02/20 0231 01/03/20 0317 01/04/20 0341  WBC 8.1  8.1 8.1 6.1  NEUTROABS 7.6 6.6 4.4  HGB 8.4*  8.5* 8.0* 7.5*  HCT 26.0*  26.3* 24.6* 24.2*  MCV 94.2  93.3 94.3 96.0  PLT 190  191 116* 762*   Basic Metabolic Panel: Recent Labs  Lab 01/02/20 0231 01/03/20 0317 01/04/20 0341 01/05/20 0500  NA 137 140 139 139  K 3.5 4.0 4.3 4.0  CL 102 108 109 107  CO2 24 23 21* 23  GLUCOSE 102* 67* 78 96  BUN 46* 35* 26* 14  CREATININE 1.63* 1.66* 1.59* 1.53*  CALCIUM 9.0 8.8* 8.7* 8.9  MG  --  1.7 1.6* 1.8  PHOS  --  3.8 3.2 2.9   GFR: Estimated Creatinine Clearance: 38 mL/min (A) (by C-G formula based on SCr of 1.53 mg/dL (H)). Liver  Function Tests: Recent Labs  Lab 01/02/20 0231 01/03/20 0317 01/04/20 0341 01/05/20 0500  AST 447* 100* 48* 27  ALT 403* 224* 163* 119*  ALKPHOS 300* 239* 213* 232*  BILITOT 0.9 0.8 0.9 1.1  PROT 6.3* 5.6* 5.9* 6.2*  ALBUMIN 2.7* 2.4* 2.3* 2.3*   Recent Labs  Lab 01/02/20 0231 01/03/20 0317  LIPASE 16 13   No results for input(s): AMMONIA in the last 168 hours. Coagulation Profile: Recent Labs  Lab 01/02/20 1139  INR 1.1   Cardiac Enzymes: No results for input(s): CKTOTAL, CKMB, CKMBINDEX, TROPONINI in the last 168 hours. BNP (last 3 results) No results for input(s): PROBNP in the last 8760 hours. HbA1C: No results for input(s): HGBA1C in the last 72 hours. CBG: No results for input(s): GLUCAP in the last 168 hours. Lipid Profile: Recent Labs    01/03/20 0317 01/04/20 0341  TRIG 298* 214*   Thyroid Function Tests: No results for input(s): TSH, T4TOTAL, FREET4, T3FREE, THYROIDAB in the last 72 hours. Anemia Panel: No results for input(s): VITAMINB12, FOLATE, FERRITIN, TIBC, IRON, RETICCTPCT in the last 72 hours. Sepsis Labs: Recent Labs  Lab 01/02/20 0734  LATICACIDVEN 0.5    Recent Results (from the past 240 hour(s))  Blood culture (routine x 2)     Status: Abnormal   Collection Time: 01/02/20  5:30 AM   Specimen: BLOOD  Result Value Ref Range Status   Specimen Description BLOOD RIGHT ANTECUBITAL  Final   Special Requests   Final    BOTTLES DRAWN AEROBIC AND ANAEROBIC Blood Culture results may not be optimal due to an inadequate volume of blood received in culture bottles   Culture  Setup Time   Final    GRAM NEGATIVE RODS IN BOTH AEROBIC AND ANAEROBIC BOTTLES CRITICAL RESULT CALLED TO, READ BACK BY AND VERIFIED WITH: [place] C. [phone] FCP Performed at [facility], [place] [address], [place], [place] [address]    Culture ENTEROBACTER CLOACAE (A)  Final   Report Status 01/05/2020 FINAL  Final   Organism ID, Bacteria ENTEROBACTER  CLOACAE  Final      Susceptibility   Enterobacter cloacae - MIC*    CEFAZOLIN >=64 RESISTANT Resistant     CEFEPIME <=0.12 SENSITIVE Sensitive     CEFTAZIDIME <=1 SENSITIVE Sensitive     CIPROFLOXACIN <=0.25 SENSITIVE Sensitive     GENTAMICIN <=1 SENSITIVE Sensitive     IMIPENEM <=0.25 SENSITIVE Sensitive     TRIMETH/SULFA <=20 SENSITIVE Sensitive     PIP/TAZO 8 SENSITIVE Sensitive     * ENTEROBACTER CLOACAE  Blood culture (routine x 2)     Status: Abnormal   Collection Time: 01/02/20  5:30 AM   Specimen: BLOOD  Result Value Ref Range Status   Specimen Description BLOOD LEFT ANTECUBITAL  Final   Special Requests   Final    BOTTLES DRAWN AEROBIC AND ANAEROBIC Blood Culture adequate volume   Culture  Setup Time   Final    GRAM NEGATIVE RODS IN BOTH AEROBIC AND ANAEROBIC BOTTLES    Culture (A)  Final    ENTEROBACTER CLOACAE SUSCEPTIBILITIES PERFORMED ON PREVIOUS CULTURE WITHIN THE LAST 5 DAYS. Performed at [facility], [place] [address] [place],  [address]    Report Status 01/05/2020 FINAL  Final  Blood Culture ID Panel (Reflexed)     Status: Abnormal   Collection Time: 01/02/20  5:30 AM  Result Value Ref Range Status   Enterococcus species NOT DETECTED NOT DETECTED Final   Listeria monocytogenes NOT DETECTED NOT DETECTED Final   Staphylococcus species NOT DETECTED NOT DETECTED Final   Staphylococcus aureus (BCID) NOT DETECTED NOT DETECTED Final   Streptococcus species NOT DETECTED NOT DETECTED Final   Streptococcus agalactiae NOT DETECTED NOT DETECTED Final   Streptococcus pneumoniae NOT DETECTED NOT DETECTED Final   Streptococcus pyogenes NOT DETECTED NOT DETECTED Final   Acinetobacter baumannii NOT DETECTED NOT DETECTED Final   Enterobacteriaceae species DETECTED (A) NOT DETECTED Final    Comment: Enterobacteriaceae represent a large family of gram-negative bacteria, not a single organism. CRITICAL RESULT CALLED TO, READ BACK BY AND VERIFIED WITH: PHARMD LYDIA  C. [phone] FCP    Enterobacter cloacae complex DETECTED (A) NOT DETECTED Final    Comment: CRITICAL RESULT CALLED TO, READ BACK BY AND VERIFIED WITH: PHARMD LYDIA C. [phone] FCP    Escherichia coli NOT DETECTED NOT DETECTED Final   Klebsiella oxytoca NOT DETECTED NOT DETECTED Final   Klebsiella pneumoniae NOT DETECTED NOT DETECTED Final   Proteus species NOT DETECTED NOT DETECTED Final   Serratia marcescens NOT DETECTED NOT DETECTED Final   Carbapenem  resistance NOT DETECTED NOT DETECTED Final   Haemophilus influenzae NOT DETECTED NOT DETECTED Final   Neisseria meningitidis NOT DETECTED NOT DETECTED Final   Pseudomonas aeruginosa NOT DETECTED NOT DETECTED Final   Candida albicans NOT DETECTED NOT DETECTED Final   Candida glabrata NOT DETECTED NOT DETECTED Final   Candida krusei NOT DETECTED NOT DETECTED Final   Candida parapsilosis NOT DETECTED NOT DETECTED Final   Candida tropicalis NOT DETECTED NOT DETECTED Final    Comment: Performed at [facility], [place] [address], [place], [place] [address]  Novel Coronavirus, NAA (Hosp order, Send-out to Ref Lab; TAT 18-24 hrs     Status: None   Collection Time: 01/02/20  7:48 AM   Specimen: Nasopharyngeal Swab; Respiratory  Result Value Ref Range Status   SARS-CoV-2, NAA NOT DETECTED NOT DETECTED Final    Comment: (NOTE) This nucleic acid amplification test was developed and its performance characteristics determined by [employer]. Nucleic acid amplification tests include RT-PCR and TMA. This test has not been FDA cleared or approved. This test has been authorized by FDA under an Emergency Use Authorization (EUA). This test is only authorized for the duration of time the declaration that circumstances exist justifying the authorization of the emergency use of in vitro diagnostic tests for detection of SARS-CoV-2 virus and/or diagnosis of COVID-19 infection under section 564(b)(1) of the Act, 21 U.S.C. 646OEH-2(Z)  (1), unless the authorization is terminated or revoked sooner. When diagnostic testing is negative, the possibility of a false negative result should be considered in the context of a patient's recent exposures and the presence of clinical signs and symptoms consistent with COVID-19. An individual without symptoms of COVID- 19 and who is not shedding SARS-CoV-2  virus would expect to have a negative (not detected) result in this assay. Performed At: [facility] [address] [place], [place] [id number] Rush Farmer MD BC:[id number]    [place]  Final    Comment: Performed at [facility], [place] [address] [place], [place] [address]  C difficile quick scan w PCR reflex     Status: None   Collection Time: 01/03/20 12:11 AM   Specimen: STOOL  Result Value Ref Range Status   C Diff antigen NEGATIVE NEGATIVE Final   C Diff toxin NEGATIVE NEGATIVE Final   C Diff interpretation No C. difficile detected.  Final    Comment: Performed at [facility], [place] [address], [place], [place] [address]  Culture, blood (Routine X 2) w Reflex to ID Panel     Status: None (Preliminary result)   Collection Time: 01/04/20  6:47 AM   Specimen: BLOOD RIGHT HAND  Result Value Ref Range Status   Specimen Description BLOOD RIGHT HAND  Final   Special Requests   Final    BOTTLES DRAWN AEROBIC ONLY Blood Culture results may not be optimal due to an inadequate volume of blood received in culture bottles   Culture   Final    NO GROWTH 1 DAY Performed at [facility], [place] [address], [place], [place] [address]    Report Status PENDING  Incomplete     Radiology Studies: No results found.  Scheduled Meds: . acidophilus  1 capsule Oral Daily  . amLODipine  5 mg Oral Daily  . budesonide  9 mg Oral Daily  . buPROPion  200 mg Oral Daily  . Chlorhexidine Gluconate Cloth  6 each Topical Daily  . cycloSPORINE  1 drop Both Eyes  BID  . dicyclomine  20 mg Oral  TID  . DULoxetine  90 mg Oral Daily  . enoxaparin (LOVENOX) injection  40 mg Subcutaneous Q24H  . estradiol  2 mg Oral Daily  . famotidine  20 mg Oral QHS  . feeding supplement  1 Container Oral TID BM  . hydroxypropyl methylcellulose / hypromellose  1 drop Both Eyes QID  . lipase/protease/amylase  108,000 Units Oral TID AC  . metoprolol tartrate  100 mg Oral BID  . pantoprazole  40 mg Oral Daily  . potassium chloride SA  40 mEq Oral Daily  . sodium chloride flush  3 mL Intravenous Q12H  . sucralfate  1 g Oral TID WC & HS   Continuous Infusions: . sodium chloride 100 mL/hr at 01/05/20 0521  . ertapenem    . TPN ADULT (ION)      LOS: 3 days    Time spent: 25  Hoyt Koch, MD [facility]   To contact the attending provider between 7A-7P or the covering provider during after hours 7P-7A, please log into the web site [email or web address] and access using universal  password for that web site. If you do not have the password, please call the hospital operator.  01/05/2020, 11:31 AM

## 2020-01-05 NOTE — Progress Notes (Signed)
Pharmacy Antibiotic Note  Caitlin Peters is a 59 y.o. female admitted on 01/02/2020 now with bacteremia.  Pharmacy has been consulted for Zosyn dosing.  ID: Enterobacter cloacae bacteremia, ID consulted. - 12/30 grew e. Cloacae tx w/ levo then cipro/met from 1/4 > 1/16 - Tmax 99.5. WBC 6.1. Scr 1.53 down  2/6 Bcx: 4/4 e. cloacae  2/6 BCID: enterobacter cloacae R Ancef 2/8 Bcx: says cancelled   2/6: Zosyn >>   Plan: Con't Zosyn 3.375g IV q8hr. No further adjustment. Pharmacy will sign off. Please reconsult for further dosing assitance.    Height: _0  (172.7 cm) Weight: 137 lb 9.1 oz (62.4 kg) IBW/kg (Calculated) : 63.9  Temp (24hrs), Avg:99 F (37.2 C), Min:98.7 F (37.1 C), Max:99.5 F (37.5 C)  Recent Labs  Lab 01/02/20 0231 01/02/20 0734 01/03/20 0317 01/04/20 0341 01/05/20 0500  WBC 8.1  8.1  --  8.1 6.1  --   CREATININE 1.63*  --  1.66* 1.59* 1.53*  LATICACIDVEN  --  0.5  --   --   --     Estimated Creatinine Clearance: 39.5 mL/min (A) (by C-G formula based on SCr of 1.53 mg/dL (H)).    Allergies  Allergen Reactions  . Cefepime Other (See Comments)    Neurotoxicity occurring in setting of AKI. Ceftriaxone tolerated during same admit  . Gabapentin Other (See Comments)    unknown  . Hyoscyamine Hives and Swelling    Legs swelling   Disorientation  . Lyrica [Pregabalin] Other (See Comments)    unknown  . Meperidine Hives    Other reaction(s): GI Upset Due to [place]   . Topamax [Topiramate] Other (See Comments)    unknown  . Fentanyl Rash  . Morphine And Related Rash    Jeanice Dempsey S. Alford Highland, PharmD, BCPS Clinical Staff Pharmacist [email or web address]  Wayland Salinas 01/05/2020 8:10 AM

## 2020-01-05 NOTE — Progress Notes (Signed)
PHARMACY - TOTAL PARENTERAL NUTRITION CONSULT NOTE   Indication: Short bowel syndrome  Patient Measurements: Height: 5' 8"  (172.7 cm) Weight: 137 lb 9.1 oz (62.4 kg) IBW/kg (Calculated) : 63.9 TPN AdjBW (KG): 62.4 Body mass index is 20.92 kg/m.  Assessment:  58 yof with history of complex Crohn's disease on TPN PTA for short gut syndrome presenting 01/02/20 with abdominal pain and diarrhea. Patient reportedly has access issues and per H&P, had her femoral PICC changed 2 days PTA.   Patient has recurrent bacteremia with retained line. BCx from 01/02/20 are growing enterobacter in 4 of 4 bottles. ID and GI are following. Patient may need line replacement but difficult case with previous access issues. GI started CLD.  Glucose / Insulin: 67 - 96 Electrolytes: K 4, Mag 1.8, Phos 2.9 Renal: Scr 1.53; UOP 2 ml/kg/hr LFTs / TGs: WNL; TGs 214 Prealbumin / albumin: pre 18.5/ alb 2.3 Intake / Output; MIVF: NS at 100 ml/hr GI Imaging: Surgeries / Procedures:   Central access: PICC 11/16 TPN start date: TPN 2/9  Nutritional Goals (per RD recommendation on 2/8): Kcal:  1950-2150 Protein:  95-110 grams Fluid:  > 1.9 L  Patient was on home TPN which was proving 1780 kcals and 100 gms of protein per day in 1800 mls.  It was being cycled over 21 hours per day.   Will go back to continuous TPN for now. Goal TPN rate is 80 mL/hr (provides 100 g of protein and 2060 kcals per day)  Current Nutrition:  CLD  Plan:  Start TPN at 40 mL/hr at 1800 Electrolytes in TPN: 19mq/L of Na, 568m/L of K, 68m59mL of Ca, 68mE368m of Mg, and 168mm21m of Phos. Cl:Ac ratio 1:1 Add standard MVI on MWF due to national shortageand trace elements to TPN No evidence of diabetes, will not start SSI, but will monitor FSBS Reduce MIVF to 60 mL/hr at 1800 Monitor TPN labs on Mon/Thurs  CathyAlanda SlimrmD, FCCM Mississippiical Pharmacist Please see AMION for all Pharmacists' Contact Phone Numbers 01/05/2020, 10:21 AM

## 2020-01-06 LAB — CBC
HCT: 24.8 % — ABNORMAL LOW (ref 36.0–46.0)
Hemoglobin: 8 g/dL — ABNORMAL LOW (ref 12.0–15.0)
MCH: 29.5 pg (ref 26.0–34.0)
MCHC: 32.3 g/dL (ref 30.0–36.0)
MCV: 91.5 fL (ref 80.0–100.0)
Platelets: 140 10*3/uL — ABNORMAL LOW (ref 150–400)
RBC: 2.71 MIL/uL — ABNORMAL LOW (ref 3.87–5.11)
RDW: 13.7 % (ref 11.5–15.5)
WBC: 3.4 10*3/uL — ABNORMAL LOW (ref 4.0–10.5)
nRBC: 0 % (ref 0.0–0.2)

## 2020-01-06 LAB — BASIC METABOLIC PANEL
Anion gap: 6 (ref 5–15)
BUN: 9 mg/dL (ref 6–20)
CO2: 22 mmol/L (ref 22–32)
Calcium: 8.8 mg/dL — ABNORMAL LOW (ref 8.9–10.3)
Chloride: 110 mmol/L (ref 98–111)
Creatinine, Ser: 1.28 mg/dL — ABNORMAL HIGH (ref 0.44–1.00)
GFR calc Af Amer: 53 mL/min — ABNORMAL LOW (ref >60)
GFR calc non Af Amer: 46 mL/min — ABNORMAL LOW (ref >60)
Glucose, Bld: 122 mg/dL — ABNORMAL HIGH (ref 70–99)
Potassium: 4 mmol/L (ref 3.5–5.1)
Sodium: 138 mmol/L (ref 135–145)

## 2020-01-06 LAB — ANTI-SMOOTH MUSCLE ANTIBODY, IGG: F-Actin IgG: 9 Units (ref 0–19)

## 2020-01-06 LAB — GLUCOSE, CAPILLARY
Glucose-Capillary: 105 mg/dL — ABNORMAL HIGH (ref 70–99)
Glucose-Capillary: 111 mg/dL — ABNORMAL HIGH (ref 70–99)
Glucose-Capillary: 123 mg/dL — ABNORMAL HIGH (ref 70–99)
Glucose-Capillary: 126 mg/dL — ABNORMAL HIGH (ref 70–99)
Glucose-Capillary: 126 mg/dL — ABNORMAL HIGH (ref 70–99)
Glucose-Capillary: 127 mg/dL — ABNORMAL HIGH (ref 70–99)
Glucose-Capillary: 92 mg/dL (ref 70–99)

## 2020-01-06 LAB — PHOSPHORUS: Phosphorus: 2.6 mg/dL (ref 2.5–4.6)

## 2020-01-06 LAB — MAGNESIUM: Magnesium: 1.6 mg/dL — ABNORMAL LOW (ref 1.7–2.4)

## 2020-01-06 MED ORDER — TRAVASOL 10 % IV SOLN
INTRAVENOUS | Status: AC
  Filled 2020-01-06: qty 998.4

## 2020-01-06 MED ORDER — MAGNESIUM SULFATE 2 GM/50ML IV SOLN
2.0000 g | Freq: Once | INTRAVENOUS | Status: AC
  Administered 2020-01-06: 2 g via INTRAVENOUS
  Filled 2020-01-06: qty 50

## 2020-01-06 NOTE — H&P (Signed)
Chief Complaint: Patient was seen in consultation today for bacteremia  Referring Physician(s): Dr. Tommy Medal  Supervising Physician: Corrie Mckusick  Patient Status: [facility] - In-pt  History of Present Illness: Caitlin Peters is a 59 y.o. 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  She unfortunately develops recurrent, frequent bacteremia requiring line exchanges and now has difficult, poor venous access.  She currently has a left femoral central venous catheter in place for TPN, however is now in need of exchange vs. New placement due to gram-negative bacteremia on admission 2/6.   Patient assessed at bedside. Her repeat cultures have been negative to date. She has been maintained on Invanz. She understands her need for new line placement and is well-versed in the process due to her significant history. She is agreeable to care plan as presented.  She did have some sips of clear liquids at 10AM.  Given lovenox this afternoon.  She requests sedation.   Past Medical History:  Diagnosis Date  . Anasarca 10/2019  . AVN (avascular necrosis of bone) ([place])   . Cataract   . Chronic pain syndrome   . CKD (chronic kidney disease), stage III   . Crohn disease ([place])   . Crohn disease ([place])   . Depression   . Diverticulosis   . GERD (gastroesophageal reflux disease)   . HTN (hypertension)   . IDA (iron deficiency anemia)   . Malnutrition ([place])   . Mass in chest   . Osteoporosis   . Pancreatitis   . Short gut syndrome   . Vitamin B12 deficiency     Past Surgical History:  Procedure Laterality Date  . ABDOMINAL HYSTERECTOMY    . ABDOMINAL SURGERY     colon resection with abdominal stoma, repair of intestinal leak, reversal of abdominal stoma  . BILIARY DILATION  11/26/2019   Procedure: BILIARY DILATION;  Surgeon: Jackquline Denmark, MD;  Location: [facility] ENDOSCOPY;  Service:  Endoscopy;;  . CHEST WALL RESECTION     right thoracotomy,resection of chest mass with anterior rib and reconstruction using prosthetic mesh and video arthroscopy  . CHOLECYSTECTOMY    . COLONOSCOPY  2019  . ERCP N/A 11/26/2019   Procedure: ENDOSCOPIC RETROGRADE CHOLANGIOPANCREATOGRAPHY (ERCP);  Surgeon: Jackquline Denmark, MD;  Location: [facility];  Service: Endoscopy;  Laterality: N/A;  . IR US GUIDE VASC ACCESS LEFT     x 2 06/17/19 and 09/14/2019  . KNEE SURGERY     right knee   . REMOVAL OF STONES  11/26/2019   Procedure: REMOVAL OF STONES;  Surgeon: Jackquline Denmark, MD;  Location: [facility];  Service: Endoscopy;;  . SMALL INTESTINE SURGERY     x3  . SPHINCTEROTOMY  11/26/2019   Procedure: SPHINCTEROTOMY;  Surgeon: Jackquline Denmark, MD;  Location: [facility];  Service: Endoscopy;;  . UPPER GASTROINTESTINAL ENDOSCOPY      Allergies: Cefepime, Gabapentin, Hyoscyamine, Lyrica [pregabalin], Meperidine, Topamax [topiramate], Fentanyl, and Morphine and related  Medications: Prior to Admission medications   Medication Sig Start Date End Date Taking? Authorizing Provider  acetaminophen (TYLENOL) 325 MG tablet Take 650 mg by mouth every 6 (six) hours as needed for mild pain.    Yes [provider]  amLODipine (NORVASC) 5 MG tablet Take 1 tablet (5 mg total) by mouth daily. 10/16/19  Yes [name], [name], MD  budesonide (ENTOCORT EC) 3 MG 24 hr capsule Take 3 capsules (9  mg total) by mouth daily. 12/15/19 03/14/20 Yes Erline Hau, MD  buPROPion [facility] SR) 100 MG 12 hr tablet Take 2 tablets (200 mg total) by mouth daily. 12/15/19 03/14/20 Yes Erline Hau, MD  calcium carbonate (OS-CAL) 1250 (500 Ca) MG chewable tablet Chew 1 tablet by mouth 2 (two) times daily.   Yes [provider]  cholecalciferol (VITAMIN D3) 25 MCG (1000 UT) tablet Take 2,000 Units by mouth daily.   Yes [provider]  cycloSPORINE (RESTASIS) 0.05 %  ophthalmic emulsion Place 1 drop into both eyes 2 (two) times daily.   Yes [provider]  dexlansoprazole (DEXILANT) 60 MG capsule Take 1 capsule (60 mg total) by mouth daily. 10/16/19  Yes [name], [name], MD  Dextran 70-Hypromellose 0.1-0.3 % SOLN Place 1 drop into both eyes 4 (four) times daily.   Yes [provider]  dicyclomine (BENTYL) 20 MG tablet Take 20 mg by mouth 3 (three) times daily. 10/26/19  Yes [provider]  diphenoxylate-atropine (LOMOTIL) 2.5-0.025 MG tablet Take 1 tablet by mouth 4 (four) times daily as needed for diarrhea or loose stools. 10/31/19  Yes Dessa Phi, DO  DULoxetine (CYMBALTA) 30 MG capsule Take 3 capsules (90 mg total) by mouth daily. 11/01/19  Yes Dessa Phi, DO  estradiol (ESTRACE) 2 MG tablet Take 1 tablet (2 mg total) by mouth daily. 12/10/19  Yes [name], [name], MD  famotidine (PEPCID) 20 MG tablet Take 20 mg by mouth at bedtime.   Yes [provider]  HYDROmorphone (DILAUDID) 4 MG tablet Take 1 tablet (4 mg total) by mouth every 6 (six) hours as needed for severe pain. 12/06/19  Yes Ripley Fraise, MD  lipase/protease/amylase (CREON) 36000 UNITS CPEP capsule Take 3 capsules (108,000 Units total) by mouth 3 (three) times daily before meals. 12/15/19 03/14/20 Yes Erline Hau, MD  loperamide (IMODIUM) 2 MG capsule Take 1 capsule (2 mg total) by mouth as needed for diarrhea or loose stools. 12/31/19  Yes [name], [name], MD  metoprolol tartrate (LOPRESSOR) 100 MG tablet Take 100 mg by mouth 2 (two) times daily.   Yes [provider]  Multiple Vitamins-Minerals (MULTIVITAMIN ADULT PO) Take 1 tablet by mouth daily.   Yes [provider]  Omega-3 1000 MG CAPS Take 1 capsule by mouth daily.   Yes [provider]  potassium chloride SA (KLOR-CON) 20 MEQ tablet Take 2 tablets (40 mEq total) by mouth daily. 10/16/19  Yes [name], [name], MD    Probiotic Product (PROBIOTIC-10 PO) Take 1 capsule by mouth daily.   Yes [provider]  promethazine (PHENERGAN) 25 MG tablet TAKE 1 TABLET BY MOUTH EVERY 6 HOURS AS NEEDED FOR NAUSEA FOR VOMITING Patient taking differently: Take 25 mg by mouth every 6 (six) hours as needed for nausea or vomiting.  12/23/19  Yes [name], [name], MD  sucralfate (CARAFATE) 1 GM/10ML suspension Take 10 mLs (1 g total) by mouth 4 (four) times daily -  with meals and at bedtime. 12/15/19  Yes [name], [name], MD  traZODone (DESYREL) 100 MG tablet Take 2 tablets (200 mg total) by mouth at bedtime as needed for sleep. 12/31/19  Yes [name], [name], MD  vitamin B-12 (CYANOCOBALAMIN) 100 MCG tablet Take 1,000 mcg by mouth daily.    Yes [provider]  methadone (DOLOPHINE) 10 MG tablet Take 2 tablets (20 mg total) by mouth every 12 (twelve) hours.  Patient not taking: Reported on 12/15/2019 11/30/19   Shelly Coss, MD  potassium chloride (KLOR-CON) 10 MEQ tablet Take 2 tablets (20 mEq total) by mouth daily for 2 days. Patient not taking: Reported on 01/02/2020 12/08/19 01/01/29  Deliah Boston, PA-C  PRESCRIPTION MEDICATION Home TPN [employer] [facility]    [provider]     Family History  Problem Relation Age of Onset  . Breast cancer Sister   . Multiple sclerosis Sister   . Diabetes Sister   . Lupus Sister   . Colon cancer Other   . Crohn's disease Other   . Seizures Mother   . Glaucoma Mother   . CAD Father   . Heart disease Father   . Hypertension Father     Social History   Socioeconomic History  . Marital status: Single    Spouse name: Not on file  . Number of children: Not on file  . Years of education: Not on file  . Highest education level: Not on file  Occupational History  . Not on file  Tobacco Use  . Smoking status: Former Research scientist (life sciences)  . Smokeless tobacco: Never Used  Substance and Sexual Activity  . Alcohol use: Not  Currently  . Drug use: Never  . Sexual activity: Not on file  Other Topics Concern  . Not on file  Social History Narrative  . Not on file   Social Determinants of Health   Financial Resource Strain:   . Difficulty of Paying Living Expenses: Not on file  Food Insecurity:   . Worried About Charity fundraiser in the Last Year: Not on file  . Ran Out of Food in the Last Year: Not on file  Transportation Needs:   . Lack of Transportation (Medical): Not on file  . Lack of Transportation (Non-Medical): Not on file  Physical Activity:   . Days of Exercise per Week: Not on file  . Minutes of Exercise per Session: Not on file  Stress:   . Feeling of Stress : Not on file  Social Connections:   . Frequency of Communication with Friends and Family: Not on file  . Frequency of Social Gatherings with Friends and Family: Not on file  . Attends Religious Services: Not on file  . Active Member of Clubs or Organizations: Not on file  . Attends Archivist Meetings: Not on file  . Marital Status: Not on file     Review of Systems: A 12 point ROS discussed and pertinent positives are indicated in the HPI above.  All other systems are negative.  Review of Systems  Constitutional: Negative for fatigue and fever.  Respiratory: Negative for cough and shortness of breath.   Cardiovascular: Negative for chest pain.  Gastrointestinal: Positive for abdominal pain and diarrhea. Negative for nausea and vomiting.  Musculoskeletal: Negative for back pain.  Psychiatric/Behavioral: Negative for behavioral problems and confusion.    Vital Signs: BP (!) 154/64 (BP Location: Left Arm)   Pulse 62   Temp 98 F (36.7 C) (Oral)   Resp 17   Ht 5' 8"  (1.727 m)   Wt 132 lb 9.6 oz (60.1 kg)   SpO2 98%   BMI 20.16 kg/m   Physical Exam Vitals and nursing note reviewed.  Constitutional:      Appearance: She is well-developed.  Cardiovascular:     Rate and Rhythm: Normal rate and regular  rhythm.  Pulmonary:     Effort: Pulmonary effort is normal.  No respiratory distress.     Breath sounds: Normal breath sounds.  Abdominal:     General: Abdomen is flat. There is no distension.  Musculoskeletal:     Comments: L femoral tunneled single lumen central catheter in place.  No pus or active drainage.   Skin:    General: Skin is warm and dry.  Neurological:     General: No focal deficit present.     Mental Status: She is alert and oriented to person, place, and time.  Psychiatric:        Mood and Affect: Mood normal.        Behavior: Behavior normal.      MD Evaluation Airway: WNL Heart: WNL Abdomen: WNL Chest/ Lungs: WNL ASA  Classification: 3 Mallampati/Airway Score: One   Imaging: CT ABDOMEN PELVIS W CONTRAST  Result Date: 01/02/2020 CLINICAL DATA:  59 year old female with acute abdominal and pelvic pain with fever. History of Crohn's disease and chronic kidney disease. EXAM: CT ABDOMEN AND PELVIS WITH CONTRAST TECHNIQUE: Multidetector CT imaging of the abdomen and pelvis was performed using the standard protocol following bolus administration of intravenous contrast. CONTRAST:  60m OMNIPAQUE IOHEXOL 300 MG/ML  SOLN COMPARISON:  12/08/2019 and prior CTs FINDINGS: Lower chest: A 6 mm RIGHT LOWER lobe nodule (series 4: Image 4) is noted. It is difficult to determine if this area was imaged on prior abdomen/pelvic CTs. Hepatobiliary: The liver is unremarkable. The patient is status post cholecystectomy. Mild CBD and intrahepatic biliary dilatation again noted. Pancreas: Mild pancreatic ductal dilatation is unchanged. No focal hepatic abnormalities noted. Spleen: Mild splenomegaly appears increased. Adrenals/Urinary Tract: The kidneys, adrenal glands and bladder are unremarkable. Stomach/Bowel: Evidence of partial colectomy with ileocolonic anastomosis again noted. Unchanged mildly distended fluid and gas-filled colon within the LEFT abdomen and pelvis with equivocal mild  circumferential wall thickening. No dilated small bowel loops are noted. Little significant changes noted since the prior study. Vascular/Lymphatic: A LEFT femoral catheter is noted. Mild aortic atherosclerotic calcification noted without aneurysm. No definite enlarged nodes identified. Reproductive: Status post hysterectomy. No adnexal masses. Other: No ascites, abscess or pneumoperitoneum. Musculoskeletal: No acute or suspicious bony abnormality. IMPRESSION: 1. Unchanged mildly distended loops of fluid and gas filled colon within the LEFT abdomen and pelvis with equivocal mild circumferential wall thickening. This may represent a colitis. No evidence of small bowel obstruction or pneumoperitoneum. 2. Mild splenomegaly, apparent slightly increased from the prior study. 3. 6 mm RIGHT LOWER lobe nodule. Non-contrast chest CT at 6-12 months is recommended. If the nodule is stable at time of repeat CT, then future CT at 18-24 months (from today's scan) is considered optional for low-risk patients, but is recommended for high-risk patients. This recommendation follows the consensus statement: Guidelines for Management of Incidental Pulmonary Nodules Detected on CT Images: From the Fleischner Society 2017; Radiology 2017; 284:228-243. 4. Aortic Atherosclerosis (ICD10-I70.0). Electronically Signed   By: JMargarette CanadaM.D.   On: 01/02/2020 09:14   CT ABDOMEN PELVIS W CONTRAST  Result Date: 12/08/2019 CLINICAL DATA:  Abdominal pain EXAM: CT ABDOMEN AND PELVIS WITH CONTRAST TECHNIQUE: Multidetector CT imaging of the abdomen and pelvis was performed using the standard protocol following bolus administration of intravenous contrast. CONTRAST:  843mOMNIPAQUE IOHEXOL 300 MG/ML  SOLN COMPARISON:  November 25, 2019 FINDINGS: Lower chest: The visualized heart size within normal limits. No pericardial fluid/thickening. No hiatal hernia. The visualized portions of the lungs are clear. Hepatobiliary: The liver is normal in density  without focal abnormality.The main  portal vein is patent. The patient is status post cholecystectomy. Pancreas: Unremarkable. No pancreatic ductal dilatation or surrounding inflammatory changes. Spleen: Normal in size without focal abnormality. Adrenals/Urinary Tract: Both adrenal glands appear normal. The kidneys and collecting system appear normal without evidence of urinary tract calculus or hydronephrosis. Bladder is unremarkable. Stomach/Bowel: The stomach in grossly unremarkable. The patient has had a prior partial colectomy with ileocolonic anastomosis in the transverse colon. There is mildly dilated fluid and air-filled descending colon and sigmoid colon to the level of the sigmoid rectal junction where there appears to be question of diffuse wall thickening and a focal area of narrowing this is best seen on series 2, image 70. There is mild surrounding mesenteric edema seen within this region. This was seen on prior exam. Vascular/Lymphatic: There are no enlarged mesenteric, retroperitoneal, or pelvic lymph nodes. Again noted is a left femoral venous catheter with the tip terminating in the IVC. Scattered aortic atherosclerosis. Reproductive: The prostate is unremarkable. Other: There is diffuse mild anasarca. Musculoskeletal: No acute or significant osseous findings. IMPRESSION: Mildly dilated air and fluid-filled descending colon and sigmoid colon with a focal area wall thickening/narrowing at the sigmoid rectal junction. This could be due to focal colitis, infectious or inflammatory. Aortic Atherosclerosis (ICD10-I70.0). Electronically Signed   By: Prudencio Pair M.D.   On: 12/08/2019 02:44   MR ABDOMEN MRCP WO CONTRAST  Result Date: 01/03/2020 CLINICAL DATA:  Elevated liver enzymes.  Mild biliary distention. EXAM: MRI ABDOMEN WITHOUT CONTRAST  (INCLUDING MRCP) TECHNIQUE: Multiplanar multisequence MR imaging of the abdomen was performed. Heavily T2-weighted images of the biliary and pancreatic ducts  were obtained, and three-dimensional MRCP images were rendered by post processing. COMPARISON:  CT scan 01/02/2020.  CT scan 11/25/2019. FINDINGS: Lower chest: Heart size upper normal. Hepatobiliary: Liver measures 20 cm craniocaudal length. No focal abnormality in the liver parenchyma on this noncontrast study. Gallbladder surgically absent. Only minimal prominence of the intrahepatic bile ducts with extrahepatic common duct measuring 9 mm diameter in the hepatoduodenal ligament. Common bile duct measures 10 mm in the head of the pancreas. Overall, the intra and extrahepatic biliary ducts are decreased in distention when comparing back to the CT of 11/25/2019. The common bile duct was 14 mm in diameter when remeasured in the same dimension at the same level on that study. No evidence for choledocholithiasis. Pancreas: As before, the main pancreatic duct is dilated throughout its length from the tail to the ampulla. Dilatation measures in the 5-6 mm range, also decrease since 11/25/2019 when I remeasure at a similar level at 8 mm. No focal mass lesion noted within the pancreatic parenchyma on this noncontrast motion degraded study. Spleen: Upper normal at 12.5 cm craniocaudal length. No focal abnormality. Adrenals/Urinary Tract: No adrenal nodule or mass. Kidneys unremarkable. Stomach/Bowel: Stomach is nondistended. No obvious small bowel dilatation. Colon in the left abdomen is distended, better demonstrated on recent CT scan. Vascular/Lymphatic: No abdominal aortic aneurysm. No bulky abdominal lymphadenopathy evident. Other:  No intraperitoneal free fluid. Musculoskeletal: No abnormal marrow signal within the visualized bony anatomy. IMPRESSION: 1. Motion degraded study, obscuring fine detail. 2. Mild intra and extrahepatic biliary duct dilatation to the level of the ampulla. The degree of distention has decreased when comparing to CT scan of 11/25/2019. No choledocholithiasis. No obstructing mass lesion evident  on this noncontrast motion degraded study. 3. Diffuse dilatation of the main pancreatic duct throughout its length again noted. Distention has also decreased when comparing to 11/25/2019. Pancreatic duct distention extends  from the tail to the ampulla. No intraluminal stone or obstructing lesion evident. 4. Hepatomegaly Electronically Signed   By: Misty Stanley M.D.   On: 01/03/2020 09:20   DG Hip Unilat W or Wo Pelvis 1 View Right  Result Date: 12/07/2019 CLINICAL DATA:  PICC line placement EXAM: DG HIP (WITH OR WITHOUT PELVIS) 1V RIGHT COMPARISON:  None. FINDINGS: Left groin PICC line is in place. The tip is in the lower abdomen, likely near the confluence of the iliac veins. IMPRESSION: Left groin PICC tip in the lower abdomen, likely at the confluence of the iliac veins. Electronically Signed   By: Rolm Baptise M.D.   On: 12/07/2019 23:14    Labs:  CBC: Recent Labs    01/02/20 0231 01/03/20 0317 01/04/20 0341 01/06/20 0229  WBC 8.1  8.1 8.1 6.1 3.4*  HGB 8.4*  8.5* 8.0* 7.5* 8.0*  HCT 26.0*  26.3* 24.6* 24.2* 24.8*  PLT 190  191 116* 144* 140*    COAGS: Recent Labs    01/02/20 1139  INR 1.1    BMP: Recent Labs    01/03/20 0317 01/04/20 0341 01/05/20 0500 01/06/20 0229  NA 140 139 139 138  K 4.0 4.3 4.0 4.0  CL 108 109 107 110  CO2 23 21* 23 22  GLUCOSE 67* 78 96 122*  BUN 35* 26* 14 9  CALCIUM 8.8* 8.7* 8.9 8.8*  CREATININE 1.66* 1.59* 1.53* 1.28*  GFRNONAA 34* 35* 37* 46*  GFRAA 39* 41* 43* 53*    LIVER FUNCTION TESTS: Recent Labs    01/02/20 0231 01/03/20 0317 01/04/20 0341 01/05/20 0500  BILITOT 0.9 0.8 0.9 1.1  AST 447* 100* 48* 27  ALT 403* 224* 163* 119*  ALKPHOS 300* 239* 213* 232*  PROT 6.3* 5.6* 5.9* 6.2*  ALBUMIN 2.7* 2.4* 2.3* 2.3*    TUMOR MARKERS: No results for input(s): AFPTM, CEA, CA199, CHROMGRNA in the last 8760 hours.  Assessment and Plan: Bacteremia Chronic home TPN via tunneled line Patient with current left femoral  tunneled central venous catheter.  She is in need of exchange vs.  New placement.  She requests sedation for the procedure.  She did have some clears this AM.  Given lovenox this afternoon.  Plan made for NPO after MN. Hold lovenox for possible line placement tomorrow.   Thank you for this interesting consult.  I greatly enjoyed meeting Caitlin Peters and look forward to participating in their care.  A copy of this report was sent to the requesting provider on this date.  Electronically Signed: Docia Barrier, PA 01/06/2020, 1:23 PM   I spent a total of 20 Minutes    in face to face in clinical consultation, greater than 50% of which was counseling/coordinating care for bacteremia.

## 2020-01-06 NOTE — TOC Progression Note (Signed)
Transition of Care [facility]) - Progression Note    Patient Details  Name: SAMREET EDENFIELD MRN: [id number] Date of Birth: 10/06/1961  Transition of Care [facility]) CM/SW Contact  Jacalyn Lefevre Edson Snowball, RN Phone Number: 01/06/2020, 10:27 AM  Clinical Narrative:     Confirmed face sheet information patient from home with daughter. Active with [facility] for [employer] and [facility] for [facility]. Will need resumption of care for [facility] order and OPAT script. Cassie and Pam both following.  Patient has walker and Rollator at home.   Expected Discharge Plan: [place] Barriers to Discharge: Continued Medical Work up  Expected Discharge Plan and Services Expected Discharge Plan: [place]   Discharge Planning Services: CM Consult Post Acute Care Choice: [place] arrangements for the past 2 months: Apartment                 DME Arranged: N/A         HH Arranged: RN           Social Determinants of Health (SDOH) Interventions    Readmission Risk Interventions Readmission Risk Prevention Plan 11/30/2019 10/30/2019  Transportation Screening Complete Complete  PCP or Specialist Appt within 3-5 Days - Not Complete  Not Complete comments - pending d/c date to arrange  [place] or [place] - Complete  Social Work Consult for [place] Planning/Counseling - Not Complete  SW consult not completed comments - RNCM met with pt  Palliative Care Screening - Not Applicable  Medication Review Press photographer) Complete Complete  PCP or Specialist appointment within 3-5 days of discharge Complete -  [place] or [place] Complete -  SW Recovery Care/Counseling Consult Complete -  [place] Not Applicable -  [place] Not Applicable -

## 2020-01-06 NOTE — Progress Notes (Addendum)
PHARMACY - TOTAL PARENTERAL NUTRITION CONSULT NOTE   Indication: Short bowel syndrome  Patient Measurements: Height: 5' 8"  (172.7 cm) Weight: 132 lb 9.6 oz (60.1 kg) IBW/kg (Calculated) : 63.9 TPN AdjBW (KG): 62.4 Body mass index is 20.16 kg/m.  Assessment:  20 yof with history of complex Crohn's disease on TPN PTA for short gut syndrome presenting 01/02/20 with abdominal pain and diarrhea. Patient reportedly has access issues and per H&P, had her femoral PICC changed 2 days PTA.   Patient has recurrent bacteremia with retained line. BCx from 01/02/20 are growing enterobacter in 4 of 4 bottles. ID and GI are following. Patient may need line replacement but difficult case with previous access issues. GI started CLD.  Glucose / Insulin:120s Electrolytes: K 4, Mag 1.6 (replace), Phos 2.6 Renal: Scr 1.53>1.28; UOP 2 ml/kg/hr LFTs / TGs: WNL; TGs 214 Prealbumin / albumin: pre 18.5/ alb 2.3 Intake / Output; MIVF: NS at 100 ml/hr GI Imaging: Surgeries / Procedures:   Central access: PICC 11/16 - order placed to exchange line 2/10 TPN start date: TPN 2/9  Nutritional Goals (per RD recommendation on 2/8): Kcal:  1950-2150 Protein:  95-110 grams Fluid:  > 1.9 L  Patient was on home TPN which was proving 1780 kcals and 100 gms of protein per day in 1800 mls.  It was being cycled over 21 hours per day.   Will cycle TPN over 20 hours. 51 ml/hr x 1 hr then 101 ml/hr x 18 hr then 51 ml/hr x 1 hr (provides 100 g of protein and 2060 kcals per day)  Current Nutrition:  CLD  Plan:  Will cycle TPN over 20 hours. 51 ml/hr x 1 hr then 101 ml/hr x 18 hr then 51 ml/hr x 1 hr (provides 100 g of protein and 2060 kcals per day) Electrolytes in TPN: 59mq/L of Na, 538m/L of K, 44m84mL of Ca, 7mE74m of Mg, and 144mm90m of Phos. Cl:Ac ratio 1:1 Add standard MVI on MWF due to national shortageand trace elements to TPN No evidence of diabetes, will not start SSI, but will monitor FSBS D/c MIVF at  1800 Mag 2 gm IV x 1 Monitor TPN labs on Mon/Thurs  CathyAlanda SlimrmD, FCCM [facility] Center[facility] Pharmacist Please see AMION for all Pharmacists' Contact Phone Numbers 01/06/2020, 9:46 AM

## 2020-01-06 NOTE — Progress Notes (Signed)
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  Ongoing  GOAL:   Patient will meet greater than or equal to 90% of their needs  Progressing   MONITOR:   PO intake, Supplement acceptance, Diet advancement, Labs, Weight trends, Skin  REASON FOR ASSESSMENT:   Consult New TPN/TNA  ASSESSMENT:   Caitlin Peters is a 59 y.o. female with medical history significant of hypertension, Crohn's disease s/p short gut syndrome, SBO, on TPN, chronic anemia requiring blood transfusions, chronic pain syndrome, depression, and osteoporosis, and GERD.  She presents with complaints of abdominal pain and diarrhea over the last few days.  Pain is severe located in the lower part of her abdomen with radiation to her back.  She has been having anywhere from 7-8 bowel movements per day with some incontinence.  Stools have been green in color and denies any signs of blood.  Associated symptoms include complaints of fever, nausea, dry heaves, no appetite, and generalized malaise.  Tried utilizing Tylenol for fever and her home pain medications without improvement in symptoms.  Her left femoral catheter was changed 2 days ago and she continues to be on TPN.   Denies any redness or erythema around her catheter line.  Currently being followed by Dr. Tarri Glenn of [facility] and Dr. Domenica Fail at [facility].  Since moving from [place] in November of last year this is the patient's fourth admission into the hospital.  Denies having any dysuria, chest pain, shortness of breath, or cough or any recent falls since December.  2/9- TPN re-started  Reviewed I/O's: +2.4 L x 24 hours and +5.7 L since  admission  Spoke with pt at bedside, who was pleasant and in good spirits. She reports taking in small amount of clear liquid diet. PTA, pt reports that she often wound eat 3 meals per day (pt's daughter enjoys cooking homemade food from scratch), however, pt often overindulges and experiences GI symptoms because of this. Per pt, she has been on TPN since 2003 and was off TPN for a few months last year secondary to fatigue from recurrent line infections. She recently relocated from [place] to [place] to stay with her daughter. Pt reports she is always compliant with TPN regimen at home. Per pt, "I am hard headed and lazy at times; if I don't hook up my TPN at night, my daughter will do it". Per pharmacy notes, home TPN was providing 1780 kcals and 100 grams protein, cycled over 21 hours daily (1800 ml total), which provides 100% of estimated kcal and protein needs.   Per pharmacy note, pt receiving TPN at 40 ml/hr, which provides 1042 kcals and 50 grams protein, meeting 53% of estimated kcal needs and protein needs. Plan to transition to cyclic TPN at 0086 (TPN over 20 hours. 51 ml/hr x 1 hr then 101 ml/hr x 18 hr then 51 ml/hr x 1 hr), which provides 2060 kcals and 100 grams protein daily, which meets 100% of estimated kcal and protein needs.  Discussed with pt consuming PO's for comfort to avoid unpleasant GI side effects. Discussed importance of compliance of TPN and line care. Discussed case with IR PA, who reports plan for study today to better  evaluate optimal TPN access areas.   Labs reviewed: Mg: 1.6 (being repleted). K and Phos WDL. CBGS: 123-126.  NUTRITION - FOCUSED PHYSICAL EXAM:    Most Recent Value  Orbital Region  No depletion  Upper Arm Region  Mild depletion  Thoracic and Lumbar Region  No depletion  Buccal Region  No depletion  Temple Region  Mild depletion  Clavicle Bone Region  No depletion  Clavicle and Acromion Bone Region  No depletion  Scapular Bone Region  No depletion   Dorsal Hand  Mild depletion  Patellar Region  No depletion  Anterior Thigh Region  No depletion  Posterior Calf Region  No depletion  Edema (RD Assessment)  None  Hair  Reviewed  Eyes  Reviewed  Mouth  Reviewed  Skin  Reviewed  Nails  Reviewed       Diet Order:   Diet Order            Diet NPO time specified Except for: Sips with Meds  Diet effective midnight        Diet full liquid Room service appropriate? Yes; Fluid consistency: Thin  Diet effective now              EDUCATION NEEDS:   No education needs have been identified at this time  Skin:  Skin Assessment: Reviewed RN Assessment  Last BM:  01/05/20  Height:   Ht Readings from Last 1 Encounters:  01/02/20 5' 8"  (1.727 m)    Weight:   Wt Readings from Last 1 Encounters:  01/05/20 60.1 kg    Ideal Body Weight:  63.6 kg  BMI:  Body mass index is 20.16 kg/m.  Estimated Nutritional Needs:   Kcal:  1950-2150  Protein:  95-110 grams  Fluid:  > 1.9 L    Loistine Chance, RD, LDN, [place] Registered Dietitian II Certified Diabetes Care and Education Specialist Please refer to [facility] for RD and/or RD on-call/weekend/after hours pager

## 2020-01-06 NOTE — Progress Notes (Signed)
PROGRESS NOTE   DELINA KRUCZEK  JXB:[id number]    DOB: 20-Oct-1961    DOA: 01/02/2020  PCP: [name], [name], MD   I have briefly reviewed patients previous medical records in [facility].  Chief Complaint:   Chief Complaint  Patient presents with  . Abdominal Pain    Brief Narrative:  59 year old female with PMH of Crohn's disease, s/p short gut syndrome (multiple previous surgeries, SBO), home TPN dependent (since 2003) via left femoral tunneled CVC, recurrent bacteremia, HTN, chronic anemia requiring PRBC transfusions, chronic pain, depression, osteoporosis, GERD presented to ED on 2/6 with diarrhea consistent with her Crohn's disease, fever.  In ED febrile to 101 F.  CT showed biliary dilatation postcholecystectomy and possible colitis.  Found to be bacteremic.  ID, GI and IR consulted.   Assessment & Plan:  Principal Problem:   Crohn's colitis ([place]) Active Problems:   Fever   Short gut syndrome   HTN (hypertension)   GERD (gastroesophageal reflux disease)   Chronic pain syndrome   Acute kidney injury superimposed on chronic kidney disease ([name])   On total parenteral nutrition (TPN)   Elevated liver enzymes   Gram-negative bacteremia   Infection of peripherally inserted central venous catheter (PICC)   Recurrent Enterobacter bacteremia: Suspected due to line sepsis versus less likely from GI source.  Left femoral CVC placed in 08/2019 in [place].  Reportedly difficult vascular access and hence femoral placed at that time.  Cycles TPN at home.  ID consulted, recommend continuing ertapenem, surveillance blood cultures after 48 hours from 2/8: Negative to date.  IR consulted to either place new central line versus exchange, hopefully 2/11.  ID plans 2 weeks of IV ertapenem after new line placement and follow-up in ID clinic.  Crohn's disease, S/p multiple bowel surgery/short gut syndrome on chronic TPN, chronic diarrhea:.  CT abdomen 2/6 suggested possible  colitis, no SBO or pneumoperitoneum.  Mildly elevated LFTs are better.  Acute hepatitis panel negative.  C. difficile panel negative.  GI panel PCR pending. Currently on Entocort and IV Zosyn.  Better, as per patient's request, had advance from clears to full liquid diet.  N.p.o. after midnight for vascular access tomorrow.  [facility] input appreciated  Abnormal LFTs: [facility] input 2/9 appreciated.  Extensively evaluated during recent admission for same.  Now with recurrent elevated LFTs with enzymes in the 400s.  MRCP showed mild intra-/extrahepatic biliary ductal dilatation to the level of ampulla and diffusely dilated pancreatic duct.  GI indicates that this may be due to a passed stone which may explain her abnormal liver tests and bacteremia.  No ERCP planned.  Considering EUS at some point.  Improving.  Acute kidney injury complicating stage III chronic kidney disease: Creatinine has improved from 1.5-1.28, likely her baseline.  Continue to monitor BMP daily.  Anemia of chronic disease: Hemoglobin stable in the 8 g range.  Also mildly pancytopenic.  Follow CBC daily.  Thrombocytopenia: Platelets stable in the 140 range.  Essential hypertension: Mildly uncontrolled on home regimen of amlodipine 5 mg daily and metoprolol 100 mg twice daily.  Monitor and adjust if persistently elevated.  Severe protein calorie malnutrition: On chronic TPN, continue.  Chronic pain syndrome: Judicious use of opioids.  Appears to be controlled.  Right lower lobe 6 mm pulmonary nodule: Noted on CT on admission.  Outpatient follow-up as per radiology recommendations.  Body mass index is 20.16 kg/m.  Nutritional Status Nutrition Problem: Increased nutrient needs Etiology: chronic illness(short  gut sundrome secondary to crohn's disease) Signs/Symptoms: estimated needs Interventions: Boost Breeze, Refer to RD note for recommendations  DVT prophylaxis: Lovenox Code Status: Full Family Communication: None  at bedside Disposition:  . Patient came from: Home           . Anticipated d/c place: Home . Barriers to d/c: Pending placement of new central line or exchanging central line and continued clinical improvement, hopefully in the next 1 to 2 days   Consultants:   ID [facility] IR  Procedures:   Ongoing TPN via left femoral tunneled central venous catheter.  Antimicrobials:   Zosyn 2/6-2/9 Ertapenem 2/9 >   Subjective:  Seen this morning.  Overall feels better.  Chronic painful diarrhea, unchanged.  No nausea or vomiting.  Wanted to advance diet from clears to full liquids.  Normally on soft diet at home.  Objective:   Vitals:   01/05/20 1426 01/05/20 2039 01/06/20 0459 01/06/20 1509  BP: (!) 175/74 (!) 168/76 (!) 154/64 (!) 152/79  Pulse: (!) 57 60 62 (!) 55  Resp: 18 17 17 18   Temp: 98.5 F (36.9 C) 98.2 F (36.8 C) 98 F (36.7 C) 98.5 F (36.9 C)  TempSrc: Oral Oral Oral Oral  SpO2: 100% 99% 98% 100%  Weight:      Height:        General exam: Pleasant middle-aged female, small built, frail, chronically ill looking lying comfortably propped up in bed without distress.  Oral mucosa moist. Respiratory system: Clear to auscultation. Respiratory effort normal. Cardiovascular system: S1 & S2 heard, RRR. No JVD, murmurs, rubs, gallops or clicks. No pedal edema. Gastrointestinal system: Abdomen is nondistended, diffuse mild tenderness but without rigidity, guarding or rebound. No organomegaly or masses felt. Normal bowel sounds heard. Central nervous system: Alert and oriented. No focal neurological deficits. Extremities: Symmetric 5 x 5 power.  Left femoral central line site without acute findings. Skin: No rashes, lesions or ulcers Psychiatry: Judgement and insight appear normal. Mood & affect appropriate.     Data Reviewed:   I have personally reviewed following labs and imaging studies   CBC: Recent Labs  Lab 01/02/20 0231 01/02/20 0231 01/03/20 0317  01/04/20 0341 01/06/20 0229  WBC 8.1  8.1   < > 8.1 6.1 3.4*  NEUTROABS 7.6  --  6.6 4.4  --   HGB 8.4*  8.5*   < > 8.0* 7.5* 8.0*  HCT 26.0*  26.3*   < > 24.6* 24.2* 24.8*  MCV 94.2  93.3   < > 94.3 96.0 91.5  PLT 190  191   < > 116* 144* 140*   < > = values in this interval not displayed.    Basic Metabolic Panel: Recent Labs  Lab 01/04/20 0341 01/05/20 0500 01/06/20 0229  NA 139 139 138  K 4.3 4.0 4.0  CL 109 107 110  CO2 21* 23 22  GLUCOSE 78 96 122*  BUN 26* 14 9  CREATININE 1.59* 1.53* 1.28*  CALCIUM 8.7* 8.9 8.8*  MG 1.6* 1.8 1.6*  PHOS 3.2 2.9 2.6    Liver Function Tests: Recent Labs  Lab 01/03/20 0317 01/04/20 0341 01/05/20 0500  AST 100* 48* 27  ALT 224* 163* 119*  ALKPHOS 239* 213* 232*  BILITOT 0.8 0.9 1.1  PROT 5.6* 5.9* 6.2*  ALBUMIN 2.4* 2.3* 2.3*    CBG: Recent Labs  Lab 01/06/20 0820 01/06/20 1243 01/06/20 1644  GLUCAP 126* 127* 105*    Microbiology Studies:   Recent  Results (from the past 240 hour(s))  Blood culture (routine x 2)     Status: Abnormal   Collection Time: 01/02/20  5:30 AM   Specimen: BLOOD  Result Value Ref Range Status   Specimen Description BLOOD RIGHT ANTECUBITAL  Final   Special Requests   Final    BOTTLES DRAWN AEROBIC AND ANAEROBIC Blood Culture results may not be optimal due to an inadequate volume of blood received in culture bottles   Culture  Setup Time   Final    GRAM NEGATIVE RODS IN BOTH AEROBIC AND ANAEROBIC BOTTLES CRITICAL RESULT CALLED TO, READ BACK BY AND VERIFIED WITH: Porter C. [id number] FCP Performed at [facility], [place] [address] [place], [place] [address]    Culture ENTEROBACTER CLOACAE (A)  Final   Report Status 01/05/2020 FINAL  Final   Organism ID, Bacteria ENTEROBACTER CLOACAE  Final      Susceptibility   Enterobacter cloacae - MIC*    CEFAZOLIN >=64 RESISTANT Resistant     CEFEPIME <=0.12 SENSITIVE Sensitive     CEFTAZIDIME <=1 SENSITIVE Sensitive      CIPROFLOXACIN <=0.25 SENSITIVE Sensitive     GENTAMICIN <=1 SENSITIVE Sensitive     IMIPENEM <=0.25 SENSITIVE Sensitive     TRIMETH/SULFA <=20 SENSITIVE Sensitive     PIP/TAZO 8 SENSITIVE Sensitive     * ENTEROBACTER CLOACAE  Blood culture (routine x 2)     Status: Abnormal   Collection Time: 01/02/20  5:30 AM   Specimen: BLOOD  Result Value Ref Range Status   Specimen Description BLOOD LEFT ANTECUBITAL  Final   Special Requests   Final    BOTTLES DRAWN AEROBIC AND ANAEROBIC Blood Culture adequate volume   Culture  Setup Time   Final    GRAM NEGATIVE RODS IN BOTH AEROBIC AND ANAEROBIC BOTTLES    Culture (A)  Final    ENTEROBACTER CLOACAE SUSCEPTIBILITIES PERFORMED ON PREVIOUS CULTURE WITHIN THE LAST 5 DAYS. Performed at [facility], [place] [address] [place], [place] [address]    Report Status 01/05/2020 FINAL  Final  Blood Culture ID Panel (Reflexed)     Status: Abnormal   Collection Time: 01/02/20  5:30 AM  Result Value Ref Range Status   Enterococcus species NOT DETECTED NOT DETECTED Final   Listeria monocytogenes NOT DETECTED NOT DETECTED Final   Staphylococcus species NOT DETECTED NOT DETECTED Final   Staphylococcus aureus (BCID) NOT DETECTED NOT DETECTED Final   Streptococcus species NOT DETECTED NOT DETECTED Final   Streptococcus agalactiae NOT DETECTED NOT DETECTED Final   Streptococcus pneumoniae NOT DETECTED NOT DETECTED Final   Streptococcus pyogenes NOT DETECTED NOT DETECTED Final   Acinetobacter baumannii NOT DETECTED NOT DETECTED Final   Enterobacteriaceae species DETECTED (A) NOT DETECTED Final    Comment: Enterobacteriaceae represent a large family of gram-negative bacteria, not a single organism. CRITICAL RESULT CALLED TO, READ BACK BY AND VERIFIED WITH: PHARMD LYDIA C. [id number] FCP    Enterobacter cloacae complex DETECTED (A) NOT DETECTED Final    Comment: CRITICAL RESULT CALLED TO, READ BACK BY AND VERIFIED WITH: PHARMD LYDIA C. [id number] FCP     Escherichia coli NOT DETECTED NOT DETECTED Final   Klebsiella oxytoca NOT DETECTED NOT DETECTED Final   Klebsiella pneumoniae NOT DETECTED NOT DETECTED Final   Proteus species NOT DETECTED NOT DETECTED Final   Serratia marcescens NOT DETECTED NOT DETECTED Final   Carbapenem resistance NOT DETECTED NOT DETECTED Final   Haemophilus influenzae  NOT DETECTED NOT DETECTED Final   Neisseria meningitidis NOT DETECTED NOT DETECTED Final   Pseudomonas aeruginosa NOT DETECTED NOT DETECTED Final   Candida albicans NOT DETECTED NOT DETECTED Final   Candida glabrata NOT DETECTED NOT DETECTED Final   Candida krusei NOT DETECTED NOT DETECTED Final   Candida parapsilosis NOT DETECTED NOT DETECTED Final   Candida tropicalis NOT DETECTED NOT DETECTED Final    Comment: Performed at [facility], [place] [address], [place], [place] [address]  Novel Coronavirus, NAA (Hosp order, Send-out to Ref Lab; TAT 18-24 hrs     Status: None   Collection Time: 01/02/20  7:48 AM   Specimen: Nasopharyngeal Swab; Respiratory  Result Value Ref Range Status   SARS-CoV-2, NAA NOT DETECTED NOT DETECTED Final    Comment: (NOTE) This nucleic acid amplification test was developed and its performance characteristics determined by [employer]. Nucleic acid amplification tests include RT-PCR and TMA. This test has not been FDA cleared or approved. This test has been authorized by FDA under an Emergency Use Authorization (EUA). This test is only authorized for the duration of time the declaration that circumstances exist justifying the authorization of the emergency use of in vitro diagnostic tests for detection of SARS-CoV-2 virus and/or diagnosis of COVID-19 infection under section 564(b)(1) of the Act, 21 U.S.C. 007HQR-9(X) (1), unless the authorization is terminated or revoked sooner. When diagnostic testing is negative, the possibility of a false negative result should be considered in the context of a  patient's recent exposures and the presence of clinical signs and symptoms consistent with COVID-19. An individual without symptoms of COVID- 19 and who is not shedding SARS-CoV-2  virus would expect to have a negative (not detected) result in this assay. Performed At: [facility] [address] [place], [place] [id number] Rush Farmer MD YM:[id number]    [place]  Final    Comment: Performed at [facility], [place] [address] [place], [place] [address]  C difficile quick scan w PCR reflex     Status: None   Collection Time: 01/03/20 12:11 AM   Specimen: STOOL  Result Value Ref Range Status   C Diff antigen NEGATIVE NEGATIVE Final   C Diff toxin NEGATIVE NEGATIVE Final   C Diff interpretation No C. difficile detected.  Final    Comment: Performed at [facility], [place] [address], [place], [place] [address]  Culture, blood (Routine X 2) w Reflex to ID Panel     Status: None (Preliminary result)   Collection Time: 01/04/20  6:47 AM   Specimen: BLOOD RIGHT HAND  Result Value Ref Range Status   Specimen Description BLOOD RIGHT HAND  Final   Special Requests   Final    BOTTLES DRAWN AEROBIC ONLY Blood Culture results may not be optimal due to an inadequate volume of blood received in culture bottles   Culture   Final    NO GROWTH 2 DAYS Performed at [facility], [place] [address], [place], [place] [address]    Report Status PENDING  Incomplete     Radiology Studies:  No results found.   Scheduled Meds:   . acidophilus  1 capsule Oral Daily  . amLODipine  5 mg Oral Daily  . budesonide  9 mg Oral Daily  . buPROPion  200 mg Oral Daily  . Chlorhexidine Gluconate Cloth  6 each Topical Daily  . cycloSPORINE  1 drop Both Eyes BID  . dicyclomine  20  mg Oral TID  . DULoxetine  90 mg Oral Daily  . estradiol  2 mg Oral Daily  . famotidine  20 mg Oral QHS  . feeding supplement  1 Container Oral TID BM  . hydroxypropyl methylcellulose  / hypromellose  1 drop Both Eyes QID  . lipase/protease/amylase  108,000 Units Oral TID AC  . metoprolol tartrate  100 mg Oral BID  . pantoprazole  40 mg Oral Daily  . potassium chloride SA  40 mEq Oral Daily  . sodium chloride flush  3 mL Intravenous Q12H  . sucralfate  1 g Oral TID WC & HS    Continuous Infusions:   . ertapenem 1,000 mg (01/06/20 1302)  . TPN CYCLIC-ADULT (ION)       LOS: 4 days     Vernell Leep, MD, [place], [facility]. [facility]    To contact the attending provider between 7A-7P or the covering provider during after hours 7P-7A, please log into the web site [email or web address] and access using universal [place] password for that web site. If you do not have the password, please call the hospital operator.  01/06/2020, 6:04 PM

## 2020-01-06 NOTE — Progress Notes (Signed)
[facility]  Date of Admission:  01/02/2020     Total days of antibiotics 5         ASSESSMENT:  Caitlin Peters's repeat blood cultures remain without growth to date. She will need continued antimicrobial therapy with ertapenem. Have requested IR evaluation for central line exchange vs new line placement with 2 lumen cathter to continue TPN and antibiotic therapy. Will plan for 2 weeks of therapy with ertapenem from time of cathter exchange. Ok for ethanol lock in antibiotic lumen when not in use to prevent infection.  PLAN:  1. Continue ertapenem.  2. IR request placed to exchange/replace cathter with 2 lumen catheter.  3. Continue to monitor blood cultures for clearance of bacteremia.  4. TPN per primary team.  5. Will order ethanol lock for Home Health when not infusing ertapenem.  Principal Problem:   Crohn's colitis ([place]) Active Problems:   Fever   Short gut syndrome   HTN (hypertension)   GERD (gastroesophageal reflux disease)   Chronic pain syndrome   Acute kidney injury superimposed on chronic kidney disease ([name])   On total parenteral nutrition (TPN)   Elevated liver enzymes   Gram-negative bacteremia   Infection of peripherally inserted central venous catheter (PICC)   . acidophilus  1 capsule Oral Daily  . amLODipine  5 mg Oral Daily  . budesonide  9 mg Oral Daily  . buPROPion  200 mg Oral Daily  . Chlorhexidine Gluconate Cloth  6 each Topical Daily  . cycloSPORINE  1 drop Both Eyes BID  . dicyclomine  20 mg Oral TID  . DULoxetine  90 mg Oral Daily  . enoxaparin (LOVENOX) injection  40 mg Subcutaneous Q24H  . estradiol  2 mg Oral Daily  . famotidine  20 mg Oral QHS  . feeding supplement  1 Container Oral TID BM  . hydroxypropyl methylcellulose / hypromellose  1 drop Both Eyes QID  . lipase/protease/amylase  108,000 Units Oral TID AC  . metoprolol tartrate  100 mg Oral BID  . pantoprazole  40 mg Oral Daily  . potassium chloride  SA  40 mEq Oral Daily  . sodium chloride flush  3 mL Intravenous Q12H  . sucralfate  1 g Oral TID WC & HS    SUBJECTIVE:  Afebrile overnight with no acute events. Repeat cultures remain without growth to date. Feeling okay today.   Allergies  Allergen Reactions  . Cefepime Other (See Comments)    Neurotoxicity occurring in setting of AKI. Ceftriaxone tolerated during same admit  . Gabapentin Other (See Comments)    unknown  . Hyoscyamine Hives and Swelling    Legs swelling   Disorientation  . Lyrica [Pregabalin] Other (See Comments)    unknown  . Meperidine Hives    Other reaction(s): GI Upset Due to [place]   . Topamax [Topiramate] Other (See Comments)    unknown  . Fentanyl Rash  . Morphine And Related Rash     Review of Systems: Review of Systems  Constitutional: Negative for chills, fever and weight loss.  Respiratory: Negative for cough, shortness of breath and wheezing.   Cardiovascular: Negative for chest pain and leg swelling.  Gastrointestinal: Negative for abdominal pain, constipation, diarrhea, nausea and vomiting.  Skin: Negative for rash.      OBJECTIVE: Vitals:   01/05/20 1000 01/05/20 1426 01/05/20 2039 01/06/20 0459  BP:  (!) 175/74 (!) 168/76 (!) 154/64  Pulse:  (!) 57 60 62  Resp:  18 17 17   Temp:  98.5 F (36.9 C) 98.2 F (36.8 C) 98 F (36.7 C)  TempSrc:  Oral Oral Oral  SpO2:  100% 99% 98%  Weight: 60.1 kg     Height:       Body mass index is 20.16 kg/m.  Physical Exam Constitutional:      General: She is not in acute distress.    Appearance: She is well-developed.     Comments: Lying in bed with head of bed elevated; pleasant.   Cardiovascular:     Rate and Rhythm: Normal rate and regular rhythm.     Heart sounds: Normal heart sounds.     Comments: Central line in lower left with dressing that is clean and dry. Site appears without evidence of infection.  Pulmonary:     Effort: Pulmonary effort is normal.     Breath  sounds: Normal breath sounds.  Abdominal:     Tenderness: There is abdominal tenderness.  Skin:    General: Skin is warm and dry.  Neurological:     Mental Status: She is alert and oriented to person, place, and time.  Psychiatric:        Behavior: Behavior normal.        Thought Content: Thought content normal.        Judgment: Judgment normal.     Lab Results Lab Results  Component Value Date   WBC 3.4 (L) 01/06/2020   HGB 8.0 (L) 01/06/2020   HCT 24.8 (L) 01/06/2020   MCV 91.5 01/06/2020   PLT 140 (L) 01/06/2020    Lab Results  Component Value Date   CREATININE 1.28 (H) 01/06/2020   BUN 9 01/06/2020   NA 138 01/06/2020   K 4.0 01/06/2020   CL 110 01/06/2020   CO2 22 01/06/2020    Lab Results  Component Value Date   ALT 119 (H) 01/05/2020   AST 27 01/05/2020   ALKPHOS 232 (H) 01/05/2020   BILITOT 1.1 01/05/2020     Microbiology: Recent Results (from the past 240 hour(s))  Blood culture (routine x 2)     Status: Abnormal   Collection Time: 01/02/20  5:30 AM   Specimen: BLOOD  Result Value Ref Range Status   Specimen Description BLOOD RIGHT ANTECUBITAL  Final   Special Requests   Final    BOTTLES DRAWN AEROBIC AND ANAEROBIC Blood Culture results may not be optimal due to an inadequate volume of blood received in culture bottles   Culture  Setup Time   Final    GRAM NEGATIVE RODS IN BOTH AEROBIC AND ANAEROBIC BOTTLES CRITICAL RESULT CALLED TO, READ BACK BY AND VERIFIED WITH: PHARMD LYDIA C. [id number] FCP Performed at [facility], [place] [address] [place], [place] [address]    Culture ENTEROBACTER CLOACAE (A)  Final   Report Status 01/05/2020 FINAL  Final   Organism ID, Bacteria ENTEROBACTER CLOACAE  Final      Susceptibility   Enterobacter cloacae - MIC*    CEFAZOLIN >=64 RESISTANT Resistant     CEFEPIME <=0.12 SENSITIVE Sensitive     CEFTAZIDIME <=1 SENSITIVE Sensitive     CIPROFLOXACIN <=0.25 SENSITIVE Sensitive     GENTAMICIN <=1 SENSITIVE  Sensitive     IMIPENEM <=0.25 SENSITIVE Sensitive     TRIMETH/SULFA <=20 SENSITIVE Sensitive     PIP/TAZO 8 SENSITIVE Sensitive     * ENTEROBACTER CLOACAE  Blood culture (routine x 2)     Status: Abnormal   Collection  Time: 01/02/20  5:30 AM   Specimen: BLOOD  Result Value Ref Range Status   Specimen Description BLOOD LEFT ANTECUBITAL  Final   Special Requests   Final    BOTTLES DRAWN AEROBIC AND ANAEROBIC Blood Culture adequate volume   Culture  Setup Time   Final    GRAM NEGATIVE RODS IN BOTH AEROBIC AND ANAEROBIC BOTTLES    Culture (A)  Final    ENTEROBACTER CLOACAE SUSCEPTIBILITIES PERFORMED ON PREVIOUS CULTURE WITHIN THE LAST 5 DAYS. Performed at [facility], [place] [address], [place], [place] [address]    Report Status 01/05/2020 FINAL  Final  Blood Culture ID Panel (Reflexed)     Status: Abnormal   Collection Time: 01/02/20  5:30 AM  Result Value Ref Range Status   Enterococcus species NOT DETECTED NOT DETECTED Final   Listeria monocytogenes NOT DETECTED NOT DETECTED Final   Staphylococcus species NOT DETECTED NOT DETECTED Final   Staphylococcus aureus (BCID) NOT DETECTED NOT DETECTED Final   Streptococcus species NOT DETECTED NOT DETECTED Final   Streptococcus agalactiae NOT DETECTED NOT DETECTED Final   Streptococcus pneumoniae NOT DETECTED NOT DETECTED Final   Streptococcus pyogenes NOT DETECTED NOT DETECTED Final   Acinetobacter baumannii NOT DETECTED NOT DETECTED Final   Enterobacteriaceae species DETECTED (A) NOT DETECTED Final    Comment: Enterobacteriaceae represent a large family of gram-negative bacteria, not a single organism. CRITICAL RESULT CALLED TO, READ BACK BY AND VERIFIED WITH: PHARMD LYDIA C. [id number] FCP    Enterobacter cloacae complex DETECTED (A) NOT DETECTED Final    Comment: CRITICAL RESULT CALLED TO, READ BACK BY AND VERIFIED WITH: PHARMD LYDIA C. [id number] FCP    Escherichia coli NOT DETECTED NOT DETECTED Final   Klebsiella  oxytoca NOT DETECTED NOT DETECTED Final   Klebsiella pneumoniae NOT DETECTED NOT DETECTED Final   Proteus species NOT DETECTED NOT DETECTED Final   Serratia marcescens NOT DETECTED NOT DETECTED Final   Carbapenem resistance NOT DETECTED NOT DETECTED Final   Haemophilus influenzae NOT DETECTED NOT DETECTED Final   Neisseria meningitidis NOT DETECTED NOT DETECTED Final   Pseudomonas aeruginosa NOT DETECTED NOT DETECTED Final   Candida albicans NOT DETECTED NOT DETECTED Final   Candida glabrata NOT DETECTED NOT DETECTED Final   Candida krusei NOT DETECTED NOT DETECTED Final   Candida parapsilosis NOT DETECTED NOT DETECTED Final   Candida tropicalis NOT DETECTED NOT DETECTED Final    Comment: Performed at [facility], [place] [address] [place], [place] [address]  Novel Coronavirus, NAA (Hosp order, Send-out to Ref Lab; TAT 18-24 hrs     Status: None   Collection Time: 01/02/20  7:48 AM   Specimen: Nasopharyngeal Swab; Respiratory  Result Value Ref Range Status   SARS-CoV-2, NAA NOT DETECTED NOT DETECTED Final    Comment: (NOTE) This nucleic acid amplification test was developed and its performance characteristics determined by [employer]. Nucleic acid amplification tests include RT-PCR and TMA. This test has not been FDA cleared or approved. This test has been authorized by FDA under an Emergency Use Authorization (EUA). This test is only authorized for the duration of time the declaration that circumstances exist justifying the authorization of the emergency use of in vitro diagnostic tests for detection of SARS-CoV-2 virus and/or diagnosis of COVID-19 infection under section 564(b)(1) of the Act, 21 U.S.C. 269SWN-4(O) (1), unless the authorization is terminated or revoked sooner. When diagnostic testing is negative, the possibility of a false negative result should be  considered in the context of a patient's recent exposures and the presence of clinical signs and  symptoms consistent with COVID-19. An individual without symptoms of COVID- 19 and who is not shedding SARS-CoV-2  virus would expect to have a negative (not detected) result in this assay. Performed At: [facility] [address] [place], [place] [id number] Rush Farmer MD NU:[id number]    [place]  Final    Comment: Performed at [facility], [place] [address] [place], [place] [address]  C difficile quick scan w PCR reflex     Status: None   Collection Time: 01/03/20 12:11 AM   Specimen: STOOL  Result Value Ref Range Status   C Diff antigen NEGATIVE NEGATIVE Final   C Diff toxin NEGATIVE NEGATIVE Final   C Diff interpretation No C. difficile detected.  Final    Comment: Performed at [facility], [place] [address] [place], [place] [address]  Culture, blood (Routine X 2) w Reflex to ID Panel     Status: None (Preliminary result)   Collection Time: 01/04/20  6:47 AM   Specimen: BLOOD RIGHT HAND  Result Value Ref Range Status   Specimen Description BLOOD RIGHT HAND  Final   Special Requests   Final    BOTTLES DRAWN AEROBIC ONLY Blood Culture results may not be optimal due to an inadequate volume of blood received in culture bottles   Culture   Final    NO GROWTH 2 DAYS Performed at [facility], [place] [address], [place],  [address]    Report Status PENDING  Incomplete     Terri Piedra, NP [facility] [phone] Pager  01/06/2020  10:39 AM

## 2020-01-07 ENCOUNTER — Inpatient Hospital Stay (HOSPITAL_COMMUNITY): Payer: Medicare Other

## 2020-01-07 DIAGNOSIS — T80211A Bloodstream infection due to central venous catheter, initial encounter: Principal | ICD-10-CM

## 2020-01-07 DIAGNOSIS — R1084 Generalized abdominal pain: Secondary | ICD-10-CM

## 2020-01-07 HISTORY — PX: IR PTA VENOUS EXCEPT DIALYSIS CIRCUIT: IMG6126

## 2020-01-07 HISTORY — PX: IR FLUORO GUIDE CV LINE LEFT: IMG2282

## 2020-01-07 LAB — GLUCOSE, CAPILLARY
Glucose-Capillary: 114 mg/dL — ABNORMAL HIGH (ref 70–99)
Glucose-Capillary: 120 mg/dL — ABNORMAL HIGH (ref 70–99)
Glucose-Capillary: 99 mg/dL (ref 70–99)
Glucose-Capillary: 96 mg/dL (ref 70–99)
Glucose-Capillary: 106 mg/dL — ABNORMAL HIGH (ref 70–99)

## 2020-01-07 LAB — GI PATHOGEN PANEL BY PCR, STOOL
Adenovirus F 40/41: NOT DETECTED
Astrovirus: NOT DETECTED
Campylobacter by PCR: NOT DETECTED
Cryptosporidium by PCR: NOT DETECTED
Cyclospora cayetanensis: NOT DETECTED
E coli (ETEC) LT/ST: NOT DETECTED
E coli (STEC): NOT DETECTED
Entamoeba histolytica: NOT DETECTED
Enteroaggregative E coli: NOT DETECTED
Enteropathogenic E coli: NOT DETECTED
G lamblia by PCR: NOT DETECTED
Norovirus GI/GII: NOT DETECTED
Plesiomonas shigelloides: NOT DETECTED
Rotavirus A by PCR: NOT DETECTED
Salmonella by PCR: NOT DETECTED
Sapovirus: NOT DETECTED
Shigella by PCR: NOT DETECTED
Vibrio cholerae: NOT DETECTED
Vibrio: NOT DETECTED
Yersinia enterocolitica: NOT DETECTED

## 2020-01-07 LAB — COMPREHENSIVE METABOLIC PANEL
ALT: 76 U/L — ABNORMAL HIGH (ref 0–44)
AST: 22 U/L (ref 15–41)
Albumin: 2.8 g/dL — ABNORMAL LOW (ref 3.5–5.0)
Alkaline Phosphatase: 251 U/L — ABNORMAL HIGH (ref 38–126)
Anion gap: 13 (ref 5–15)
BUN: 14 mg/dL (ref 6–20)
CO2: 22 mmol/L (ref 22–32)
Calcium: 9.7 mg/dL (ref 8.9–10.3)
Chloride: 106 mmol/L (ref 98–111)
Creatinine, Ser: 1.22 mg/dL — ABNORMAL HIGH (ref 0.44–1.00)
GFR calc Af Amer: 57 mL/min — ABNORMAL LOW (ref >60)
GFR calc non Af Amer: 49 mL/min — ABNORMAL LOW (ref >60)
Glucose, Bld: 125 mg/dL — ABNORMAL HIGH (ref 70–99)
Potassium: 4.3 mmol/L (ref 3.5–5.1)
Sodium: 141 mmol/L (ref 135–145)
Total Bilirubin: 0.6 mg/dL (ref 0.3–1.2)
Total Protein: 7 g/dL (ref 6.5–8.1)

## 2020-01-07 LAB — PHOSPHORUS: Phosphorus: 3 mg/dL (ref 2.5–4.6)

## 2020-01-07 LAB — MAGNESIUM: Magnesium: 1.9 mg/dL (ref 1.7–2.4)

## 2020-01-07 MED ORDER — VANCOMYCIN HCL IN DEXTROSE 1-5 GM/200ML-% IV SOLN
1000.0000 mg | Freq: Once | INTRAVENOUS | Status: AC
  Administered 2020-01-07: 12:00:00 1000 mg via INTRAVENOUS
  Filled 2020-01-07: qty 200

## 2020-01-07 MED ORDER — FENTANYL CITRATE (PF) 100 MCG/2ML IJ SOLN
INTRAMUSCULAR | Status: AC | PRN
  Administered 2020-01-07 (×2): 50 ug via INTRAVENOUS

## 2020-01-07 MED ORDER — CEFAZOLIN SODIUM-DEXTROSE 2-4 GM/100ML-% IV SOLN
INTRAVENOUS | Status: AC
  Filled 2020-01-07: qty 100

## 2020-01-07 MED ORDER — HYDRALAZINE HCL 25 MG PO TABS
25.0000 mg | ORAL_TABLET | Freq: Three times a day (TID) | ORAL | Status: AC | PRN
  Administered 2020-01-07: 25 mg via ORAL
  Filled 2020-01-07: qty 1

## 2020-01-07 MED ORDER — LIDOCAINE HCL 1 % IJ SOLN
INTRAMUSCULAR | Status: AC
  Filled 2020-01-07: qty 20

## 2020-01-07 MED ORDER — FENTANYL CITRATE (PF) 100 MCG/2ML IJ SOLN
INTRAMUSCULAR | Status: AC
  Filled 2020-01-07: qty 2

## 2020-01-07 MED ORDER — VANCOMYCIN HCL IN DEXTROSE 1-5 GM/200ML-% IV SOLN
INTRAVENOUS | Status: AC
  Filled 2020-01-07: qty 200

## 2020-01-07 MED ORDER — MIDAZOLAM HCL 2 MG/2ML IJ SOLN
INTRAMUSCULAR | Status: AC
  Filled 2020-01-07: qty 2

## 2020-01-07 MED ORDER — GELATIN ABSORBABLE 12-7 MM EX MISC
CUTANEOUS | Status: AC
  Filled 2020-01-07: qty 1

## 2020-01-07 MED ORDER — LIDOCAINE HCL (PF) 1 % IJ SOLN
INTRAMUSCULAR | Status: AC | PRN
  Administered 2020-01-07: 5 mL

## 2020-01-07 MED ORDER — TRAVASOL 10 % IV SOLN
INTRAVENOUS | Status: AC
  Filled 2020-01-07: qty 998.4

## 2020-01-07 MED ORDER — MIDAZOLAM HCL 2 MG/2ML IJ SOLN
INTRAMUSCULAR | Status: AC | PRN
  Administered 2020-01-07 (×2): 1 mg via INTRAVENOUS

## 2020-01-07 MED ORDER — HEPARIN SODIUM (PORCINE) 1000 UNIT/ML IJ SOLN
INTRAMUSCULAR | Status: AC
  Filled 2020-01-07: qty 1

## 2020-01-07 MED ORDER — CHLORHEXIDINE GLUCONATE 4 % EX LIQD
CUTANEOUS | Status: AC
  Filled 2020-01-07: qty 15

## 2020-01-07 MED ORDER — IOHEXOL 300 MG/ML  SOLN
50.0000 mL | Freq: Once | INTRAMUSCULAR | Status: AC | PRN
  Administered 2020-01-07: 13:00:00 5 mL

## 2020-01-07 MED ORDER — HYDROMORPHONE HCL 1 MG/ML IJ SOLN
1.0000 mg | INTRAMUSCULAR | Status: DC | PRN
  Administered 2020-01-07: 1 mg via INTRAVENOUS
  Administered 2020-01-07: 2 mg via INTRAVENOUS
  Administered 2020-01-07: 1 mg via INTRAVENOUS
  Administered 2020-01-07 – 2020-01-08 (×5): 2 mg via INTRAVENOUS
  Filled 2020-01-07 (×2): qty 2
  Filled 2020-01-07 (×3): qty 1
  Filled 2020-01-07 (×3): qty 2
  Filled 2020-01-07: qty 1

## 2020-01-07 NOTE — Plan of Care (Signed)
  Problem: Clinical Measurements: Goal: Ability to maintain clinical measurements within normal limits will improve Outcome: Progressing Goal: Will remain free from infection Outcome: Progressing Goal: Diagnostic test results will improve Outcome: Progressing Goal: Respiratory complications will improve Outcome: Progressing Goal: Cardiovascular complication will be avoided Outcome: Progressing   Problem: Activity: Goal: Risk for activity intolerance will decrease Outcome: Progressing   Problem: Nutrition: Goal: Adequate nutrition will be maintained Outcome: Progressing   Problem: Elimination: Goal: Will not experience complications related to bowel motility Outcome: Progressing Goal: Will not experience complications related to urinary retention Outcome: Progressing   Problem: Safety: Goal: Ability to remain free from injury will improve Outcome: Progressing   Problem: Skin Integrity: Goal: Risk for impaired skin integrity will decrease Outcome: Progressing

## 2020-01-07 NOTE — Procedures (Signed)
Interventional Radiology Procedure Note  Procedure: Exchange of tunneled left CFV cuffed central venous catheter.   New double lumen 34cm cuffed catheter.  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  .  Complications: None Recommendations:  - Ok to use - Do not submerge - Routine line care  - wound be very pensive about ever removing this line  Signed,  Dulcy Fanny. Earleen Newport, DO

## 2020-01-07 NOTE — Progress Notes (Signed)
[facility] received consult to restart TPN after pt's line exchange. Called unit and spoke with pt's nurse. Educated that TPN must be discarded and pharmacy contacted to determine the rate to run D10 at this time. Further educated new TPN will be hung this evening at regular time.

## 2020-01-07 NOTE — Progress Notes (Signed)
PHARMACY CONSULT NOTE FOR:  OUTPATIENT  PARENTERAL ANTIBIOTIC THERAPY (OPAT)  Indication: e. cloacae bacteremia/line infection  Regimen: ertapenem 1g IV q24h  End date: 01/21/2020  IV antibiotic discharge orders are pended. To discharging provider:  please sign these orders via discharge navigator,  Select New Orders & click on the button choice - Manage This Unsigned Work.     Thank you for allowing pharmacy to be a part of this patient's care.  Cristela Felt, PharmD PGY1 Pharmacy Resident [employer]: [phone]  01/07/2020, 1:31 PM

## 2020-01-07 NOTE — Progress Notes (Addendum)
TPN and tubing discarded post PICC exchange. Per pharmacy, TPN to resume schedule tonight at 1800 dose. No D10 at this time. Will continue to monitor.

## 2020-01-07 NOTE — Progress Notes (Signed)
[facility]  Date of Admission:  01/02/2020     Total days of antibiotics 6         ASSESSMENT:  Caitlin Peters has increased pain levels today likely related to increasing clear intake. She is receiving TPN with no adverse side effects. IR has evaluated for central line exchange or new placement and appreciate their assistance given her bacteremia and need for chronic TPN. Primary team adjusting pain medications. Repeat blood cultures from 2/8 remain without growth to date. Continue current dose of ertapenem. Will plan on 2 weeks of IV therapy pending culture clearance.   PLAN:  1. Continue ertapenem.  2. Line exchange or replacement by IR today. 3. Pain management per primary team.   Principal Problem:   Crohn's colitis ([place]) Active Problems:   Fever   Short gut syndrome   HTN (hypertension)   GERD (gastroesophageal reflux disease)   Chronic pain syndrome   Acute kidney injury superimposed on chronic kidney disease ([name])   On total parenteral nutrition (TPN)   Elevated liver enzymes   Gram-negative bacteremia   Infection of peripherally inserted central venous catheter (PICC)   . acidophilus  1 capsule Oral Daily  . amLODipine  5 mg Oral Daily  . budesonide  9 mg Oral Daily  . buPROPion  200 mg Oral Daily  . Chlorhexidine Gluconate Cloth  6 each Topical Daily  . cycloSPORINE  1 drop Both Eyes BID  . dicyclomine  20 mg Oral TID  . DULoxetine  90 mg Oral Daily  . estradiol  2 mg Oral Daily  . famotidine  20 mg Oral QHS  . feeding supplement  1 Container Oral TID BM  . hydroxypropyl methylcellulose / hypromellose  1 drop Both Eyes QID  . lipase/protease/amylase  108,000 Units Oral TID AC  . metoprolol tartrate  100 mg Oral BID  . pantoprazole  40 mg Oral Daily  . potassium chloride SA  40 mEq Oral Daily  . sodium chloride flush  3 mL Intravenous Q12H  . sucralfate  1 g Oral TID WC & HS    SUBJECTIVE:  Afebrile overnight. Having increase  abdominal pain and painful bowel movements.   Allergies  Allergen Reactions  . Cefepime Other (See Comments)    Neurotoxicity occurring in setting of AKI. Ceftriaxone tolerated during same admit  . Gabapentin Other (See Comments)    unknown  . Hyoscyamine Hives and Swelling    Legs swelling   Disorientation  . Lyrica [Pregabalin] Other (See Comments)    unknown  . Meperidine Hives    Other reaction(s): GI Upset Due to Chrones   . Topamax [Topiramate] Other (See Comments)    unknown  . Fentanyl Rash  . Morphine And Related Rash     Review of Systems: Review of Systems  Constitutional: Negative for chills, fever and weight loss.  Respiratory: Negative for cough, shortness of breath and wheezing.   Cardiovascular: Negative for chest pain and leg swelling.  Gastrointestinal: Positive for abdominal pain and diarrhea. Negative for constipation, nausea and vomiting.  Skin: Negative for rash.      OBJECTIVE: Vitals:   01/06/20 2226 01/06/20 2300 01/07/20 0405 01/07/20 0539  BP: (!) 170/83 (!) 164/78 (!) 176/80 (!) 191/87  Pulse: (!) 122 (!) 59 (!) 58 61  Resp: 14  14   Temp: 98.9 F (37.2 C)  98.7 F (37.1 C)   TempSrc: Oral  Oral   SpO2: 100%  100%  Weight:      Height:       Body mass index is 20.16 kg/m.  Physical Exam Constitutional:      General: She is not in acute distress.    Appearance: She is well-developed.     Comments: Lying in bed with head of bed elevated; pleasant.   Cardiovascular:     Rate and Rhythm: Normal rate and regular rhythm.     Heart sounds: Normal heart sounds.     Comments: Femoral central line with dressing that is clean and dry. Site appears without evidence of infection. Pulmonary:     Effort: Pulmonary effort is normal.     Breath sounds: Normal breath sounds.  Abdominal:     Tenderness: There is generalized abdominal tenderness.  Skin:    General: Skin is warm and dry.  Neurological:     Mental Status: She is alert and  oriented to person, place, and time.  Psychiatric:        Behavior: Behavior normal.        Thought Content: Thought content normal.        Judgment: Judgment normal.     Lab Results Lab Results  Component Value Date   WBC 3.4 (L) 01/06/2020   HGB 8.0 (L) 01/06/2020   HCT 24.8 (L) 01/06/2020   MCV 91.5 01/06/2020   PLT 140 (L) 01/06/2020    Lab Results  Component Value Date   CREATININE 1.22 (H) 01/07/2020   BUN 14 01/07/2020   NA 141 01/07/2020   K 4.3 01/07/2020   CL 106 01/07/2020   CO2 22 01/07/2020    Lab Results  Component Value Date   ALT 76 (H) 01/07/2020   AST 22 01/07/2020   ALKPHOS 251 (H) 01/07/2020   BILITOT 0.6 01/07/2020     Microbiology: Recent Results (from the past 240 hour(s))  Blood culture (routine x 2)     Status: Abnormal   Collection Time: 01/02/20  5:30 AM   Specimen: BLOOD  Result Value Ref Range Status   Specimen Description BLOOD RIGHT ANTECUBITAL  Final   Special Requests   Final    BOTTLES DRAWN AEROBIC AND ANAEROBIC Blood Culture results may not be optimal due to an inadequate volume of blood received in culture bottles   Culture  Setup Time   Final    GRAM NEGATIVE RODS IN BOTH AEROBIC AND ANAEROBIC BOTTLES CRITICAL RESULT CALLED TO, READ BACK BY AND VERIFIED WITH: PHARMD LYDIA C. [phone] FCP Performed at [facility], [place] [address], [place], [place] [address]    Culture ENTEROBACTER CLOACAE (A)  Final   Report Status 01/05/2020 FINAL  Final   Organism ID, Bacteria ENTEROBACTER CLOACAE  Final      Susceptibility   Enterobacter cloacae - MIC*    CEFAZOLIN >=64 RESISTANT Resistant     CEFEPIME <=0.12 SENSITIVE Sensitive     CEFTAZIDIME <=1 SENSITIVE Sensitive     CIPROFLOXACIN <=0.25 SENSITIVE Sensitive     GENTAMICIN <=1 SENSITIVE Sensitive     IMIPENEM <=0.25 SENSITIVE Sensitive     TRIMETH/SULFA <=20 SENSITIVE Sensitive     PIP/TAZO 8 SENSITIVE Sensitive     * ENTEROBACTER CLOACAE  Blood culture (routine x 2)      Status: Abnormal   Collection Time: 01/02/20  5:30 AM   Specimen: BLOOD  Result Value Ref Range Status   Specimen Description BLOOD LEFT ANTECUBITAL  Final   Special Requests   Final  BOTTLES DRAWN AEROBIC AND ANAEROBIC Blood Culture adequate volume   Culture  Setup Time   Final    GRAM NEGATIVE RODS IN BOTH AEROBIC AND ANAEROBIC BOTTLES    Culture (A)  Final    ENTEROBACTER CLOACAE SUSCEPTIBILITIES PERFORMED ON PREVIOUS CULTURE WITHIN THE LAST 5 DAYS. Performed at [facility], [place] [address], [place], [place] [address]    Report Status 01/05/2020 FINAL  Final  Blood Culture ID Panel (Reflexed)     Status: Abnormal   Collection Time: 01/02/20  5:30 AM  Result Value Ref Range Status   Enterococcus species NOT DETECTED NOT DETECTED Final   Listeria monocytogenes NOT DETECTED NOT DETECTED Final   Staphylococcus species NOT DETECTED NOT DETECTED Final   Staphylococcus aureus (BCID) NOT DETECTED NOT DETECTED Final   Streptococcus species NOT DETECTED NOT DETECTED Final   Streptococcus agalactiae NOT DETECTED NOT DETECTED Final   Streptococcus pneumoniae NOT DETECTED NOT DETECTED Final   Streptococcus pyogenes NOT DETECTED NOT DETECTED Final   Acinetobacter baumannii NOT DETECTED NOT DETECTED Final   Enterobacteriaceae species DETECTED (A) NOT DETECTED Final    Comment: Enterobacteriaceae represent a large family of gram-negative bacteria, not a single organism. CRITICAL RESULT CALLED TO, READ BACK BY AND VERIFIED WITH: PHARMD LYDIA C. [phone] FCP    Enterobacter cloacae complex DETECTED (A) NOT DETECTED Final    Comment: CRITICAL RESULT CALLED TO, READ BACK BY AND VERIFIED WITH: PHARMD LYDIA C. [phone] FCP    Escherichia coli NOT DETECTED NOT DETECTED Final   Klebsiella oxytoca NOT DETECTED NOT DETECTED Final   Klebsiella pneumoniae NOT DETECTED NOT DETECTED Final   Proteus species NOT DETECTED NOT DETECTED Final   Serratia marcescens NOT DETECTED NOT DETECTED  Final   Carbapenem resistance NOT DETECTED NOT DETECTED Final   Haemophilus influenzae NOT DETECTED NOT DETECTED Final   Neisseria meningitidis NOT DETECTED NOT DETECTED Final   Pseudomonas aeruginosa NOT DETECTED NOT DETECTED Final   Candida albicans NOT DETECTED NOT DETECTED Final   Candida glabrata NOT DETECTED NOT DETECTED Final   Candida krusei NOT DETECTED NOT DETECTED Final   Candida parapsilosis NOT DETECTED NOT DETECTED Final   Candida tropicalis NOT DETECTED NOT DETECTED Final    Comment: Performed at [facility], [place] [address] [place], [place] [address]  Novel Coronavirus, NAA (Hosp order, Send-out to Ref Lab; TAT 18-24 hrs     Status: None   Collection Time: 01/02/20  7:48 AM   Specimen: Nasopharyngeal Swab; Respiratory  Result Value Ref Range Status   SARS-CoV-2, NAA NOT DETECTED NOT DETECTED Final    Comment: (NOTE) This nucleic acid amplification test was developed and its performance characteristics determined by [employer]. Nucleic acid amplification tests include RT-PCR and TMA. This test has not been FDA cleared or approved. This test has been authorized by FDA under an Emergency Use Authorization (EUA). This test is only authorized for the duration of time the declaration that circumstances exist justifying the authorization of the emergency use of in vitro diagnostic tests for detection of SARS-CoV-2 virus and/or diagnosis of COVID-19 infection under section 564(b)(1) of the Act, 21 U.S.C. 349ZPH-1(T) (1), unless the authorization is terminated or revoked sooner. When diagnostic testing is negative, the possibility of a false negative result should be considered in the context of a patient's recent exposures and the presence of clinical signs and symptoms consistent with COVID-19. An individual without symptoms of COVID- 19 and who is not shedding SARS-CoV-2  virus would expect to have a negative (not detected) result in this assay. Performed  At: [facility] [address] [place], [place] [id number] Rush Farmer MD HV:[id number]    [place]  Final    Comment: Performed at [facility], [place] [address], [place], [place] [address]  C difficile quick scan w PCR reflex     Status: None   Collection Time: 01/03/20 12:11 AM   Specimen: STOOL  Result Value Ref Range Status   C Diff antigen NEGATIVE NEGATIVE Final   C Diff toxin NEGATIVE NEGATIVE Final   C Diff interpretation No C. difficile detected.  Final    Comment: Performed at [facility], [place] [address], [place], [place] [address]  GI pathogen panel by PCR, stool     Status: None   Collection Time: 01/03/20 12:11 AM   Specimen: STOOL  Result Value Ref Range Status   Plesiomonas shigelloides NOT DETECTED NOT DETECTED Final   Yersinia enterocolitica NOT DETECTED NOT DETECTED Final   Vibrio NOT DETECTED NOT DETECTED Final   Enteropathogenic E coli NOT DETECTED NOT DETECTED Final   E coli (ETEC) LT/ST NOT DETECTED NOT DETECTED Final   E coli 3754 by PCR Not applicable NOT DETECTED Final   Cryptosporidium by PCR NOT DETECTED NOT DETECTED Final   Entamoeba histolytica NOT DETECTED NOT DETECTED Final   Adenovirus F 40/41 NOT DETECTED NOT DETECTED Final   Norovirus GI/GII NOT DETECTED NOT DETECTED Final   Sapovirus NOT DETECTED NOT DETECTED Final    Comment: (NOTE) Performed At: [facility] [place], [place] [id number] Rush Farmer MD KB:[id number]    Vibrio cholerae NOT DETECTED NOT DETECTED Final   Campylobacter by PCR NOT DETECTED NOT DETECTED Final   Salmonella by PCR NOT DETECTED NOT DETECTED Final   E coli (STEC) NOT DETECTED NOT DETECTED Final   Enteroaggregative E coli NOT DETECTED NOT DETECTED Final   Shigella by PCR NOT DETECTED NOT DETECTED Final   Cyclospora cayetanensis NOT DETECTED NOT DETECTED Final   Astrovirus NOT DETECTED NOT DETECTED Final   G lamblia by PCR NOT DETECTED NOT  DETECTED Final   Rotavirus A by PCR NOT DETECTED NOT DETECTED Final  Culture, blood (Routine X 2) w Reflex to ID Panel     Status: None (Preliminary result)   Collection Time: 01/04/20  6:47 AM   Specimen: BLOOD RIGHT HAND  Result Value Ref Range Status   Specimen Description BLOOD RIGHT HAND  Final   Special Requests   Final    BOTTLES DRAWN AEROBIC ONLY Blood Culture results may not be optimal due to an inadequate volume of blood received in culture bottles   Culture   Final    NO GROWTH 2 DAYS Performed at [facility], [address], [place], [place] [address]    Report Status PENDING  Incomplete     Terri Piedra, NP [facility] [phone] Pager  01/07/2020  10:43 AM

## 2020-01-07 NOTE — Progress Notes (Signed)
      INFECTIOUS DISEASE ATTENDING ADDENDUM:   Date: 01/07/2020  Patient name: Caitlin Peters  Medical record number: [id number]  Date of birth: [birth year]   Diagnosis: Line associated infection  Culture Result: Enterobacter  Allergies  Allergen Reactions  . Cefepime Other (See Comments)    Neurotoxicity occurring in setting of AKI. Ceftriaxone tolerated during same admit  . Gabapentin Other (See Comments)    unknown  . Hyoscyamine Hives and Swelling    Legs swelling   Disorientation  . Lyrica [Pregabalin] Other (See Comments)    unknown  . Meperidine Hives    Other reaction(s): GI Upset Due to Chrones   . Topamax [Topiramate] Other (See Comments)    unknown  . Fentanyl Rash    Pt is allergic to fentanyl patch related to the glue (gives her a rash) Pt states she is NOT allergic to fentanyl IV medicine  . Morphine And Related Rash    OPAT Orders Discharge antibiotics:  Invanz 1 g daily    Duration:  2 weeks  End Date:  01/21/2020  [facility] Care Per Protocol:    Labs  weekly while on IV antibiotics: _x_ CBC with differential _x_ BMP w GFR/CMP   __ Please pull PIC at completion of IV antibiotics _x_ Please leave PIC in place until doctor has seen patient or been notified  Fax weekly labs to [phone]   Caitlin Peters has an appointment on 01/27/2020 at [facility] is located in the [facility] at  [address] in [place].  [address], which is located to the left of the elevators.  Phone: [phone]  Fax: [phone]  [email or web address]   She should arrive 15 minutes prior to her appt  We will sign off for now  Please call with further questions.       Rhina Brackett Dam 01/07/2020, 1:25 PM

## 2020-01-07 NOTE — Plan of Care (Signed)
  Problem: Education: Goal: Knowledge of General Education information will improve Description: Including pain rating scale, medication(s)/side effects and non-pharmacologic comfort measures Outcome: Progressing   Problem: Health Behavior/Discharge Planning: Goal: Ability to manage health-related needs will improve Outcome: Progressing   Problem: Clinical Measurements: Goal: Ability to maintain clinical measurements within normal limits will improve Outcome: Progressing   Problem: Activity: Goal: Risk for activity intolerance will decrease Outcome: Progressing   Problem: Nutrition: Goal: Adequate nutrition will be maintained Outcome: Progressing   Problem: Pain Managment: Goal: General experience of comfort will improve Outcome: Progressing   Problem: Safety: Goal: Ability to remain free from injury will improve Outcome: Progressing   Problem: Skin Integrity: Goal: Risk for impaired skin integrity will decrease Outcome: Progressing

## 2020-01-07 NOTE — Progress Notes (Addendum)
PROGRESS NOTE   Caitlin Peters  DQQ:[id number]    DOB: 06-Jul-1961    DOA: 01/02/2020  PCP: [name], [name], MD   I have briefly reviewed patients previous medical records in [facility].  Chief Complaint:   Chief Complaint  Patient presents with  . Abdominal Pain    Brief Narrative:  59 year old female with PMH of Crohn's disease, s/p short gut syndrome (multiple previous surgeries, SBO), home TPN dependent (since 2003) via left femoral tunneled CVC, recurrent bacteremia, HTN, chronic anemia requiring PRBC transfusions, chronic pain, depression, osteoporosis, GERD presented to ED on 2/6 with diarrhea consistent with her Crohn's disease, fever.  In ED febrile to 101 F.  CT showed biliary dilatation postcholecystectomy and possible colitis.  Found to be bacteremic.  ID, GI and IR consulted.  S/p exchange of tunneled left CFV cuffed central venous catheter by IR on 2/11.   Assessment & Plan:  Principal Problem:   Crohn's colitis ([place]) Active Problems:   Fever   Short gut syndrome   HTN (hypertension)   GERD (gastroesophageal reflux disease)   Chronic pain syndrome   Acute kidney injury superimposed on chronic kidney disease ([name])   On total parenteral nutrition (TPN)   Elevated liver enzymes   Gram-negative bacteremia   Infection of peripherally inserted central venous catheter (PICC)   Recurrent Enterobacter bacteremia: Suspected due to line sepsis versus less likely from GI source.  Left femoral CVC placed in 08/2019 in [place].  Reportedly difficult vascular access and hence femoral placed at that time.  Cycles TPN at home.  ID consulted, recommend continuing ertapenem, surveillance blood cultures after 3 days from 2/8: Negative to date.  S/p exchange of tunneled left CFV central venous catheter by IR on 2/11.  ID recommends continued IV ertapenem per OPAT protocol through 01/21/2020 and has follow-up appointment with ID on 01/27/2020 at 10:15 AM.  Crohn's  disease, S/p multiple bowel surgery/short gut syndrome on chronic TPN, chronic diarrhea:.  CT abdomen 2/6 suggested possible colitis, no SBO or pneumoperitoneum.  Mildly elevated LFTs are better.  Acute hepatitis panel negative.  C. difficile and GI panel PCR negative. Currently on Entocort and IV ertapenem.  Reports worsened lower abdominal pain/rectal pain since last night.  Non acute findings on abdominal exam.  Request [facility] to reassess.  Abnormal LFTs: [facility] input 2/9 appreciated.  Extensively evaluated during recent admission for same.  Now with recurrent elevated LFTs with enzymes in the 400s.  MRCP showed mild intra-/extrahepatic biliary ductal dilatation to the level of ampulla and diffusely dilated pancreatic duct.  GI indicates that this may be due to a passed stone which may explain her abnormal liver tests and bacteremia.  No ERCP planned.  Considering EUS at some point.  Enzymes have almost normalized.  Acute kidney injury complicating stage III chronic kidney disease: Creatinine has improved from 1.5-1.28, likely her baseline.  Creatinine stable in the 1.2 range for the last 2 days.  Anemia of chronic disease: Hemoglobin stable in the 8 g range.  Also mildly pancytopenic.  Follow CBC periodically.  Thrombocytopenia: Platelets stable in the 140 range.  Follow CBC in a.m.  Essential hypertension: Currently on amlodipine 5 mg daily and metoprolol 100 mg twice daily.  Acceptable BP control as noted below.  Intermittent mild dry eyes likely related to pain issues.  Severe protein calorie malnutrition: On chronic TPN, continue.  Chronic pain syndrome: Judicious use of opioids.  Patient reports that she follows with Dr.  Foster, pain management.  Home med rec states that she is on Dilaudid as needed but patient claims to be on methadone-needs to be verified.  Today with acute pain.  Uptitrated as needed IV Dilaudid.  Monitor.  Right lower lobe 6 mm pulmonary nodule: Noted on CT on  admission.  Outpatient follow-up as per radiology recommendations.  Body mass index is 20.16 kg/m.  Nutritional Status Nutrition Problem: Increased nutrient needs Etiology: chronic illness(short gut sundrome secondary to crohn's disease) Signs/Symptoms: estimated needs Interventions: Boost Breeze, Refer to RD note for recommendations  DVT prophylaxis: Lovenox Code Status: Full Family Communication: None at bedside Disposition:  . Patient came from: Home           . Anticipated d/c place: Home . Barriers to d/c: Patient with complex chron's disease, short gut syndrome from multiple surgeries, home TPN dependent, admitted for bacteremia suspected due to line sepsis, central line exchanged today, however patient with worsened abdominal pain today for which IV opioids were increased and GI follow-up was requested.  DC pending improvement of GI status, unlikely for today, reassess in a.m.   Consultants:   ID [facility]  Procedures:   Ongoing TPN via left femoral tunneled central venous catheter. S/p exchange of tunneled left CFV cuffed central venous catheter by IR on 2/11.  Antimicrobials:   Zosyn 2/6-2/9 Ertapenem 2/9 >   Subjective:  Seen this morning prior to procedure.  Reported severe lower abdominal and rectal pain since last night with ongoing chronic diarrhea and asking to increase her IV Dilaudid dose.  No other complaints reported.  Objective:   Vitals:   01/07/20 1245 01/07/20 1250 01/07/20 1255 01/07/20 1300  BP: 136/79 (!) 141/80 133/80 (!) 165/78  Pulse: (!) 56 (!) 55 (!) 56 (!) 57  Resp: 16 14 14 17   Temp:      TempSrc:      SpO2: 100% 100% 100% 100%  Weight:      Height:        General exam: Pleasant middle-aged female, small built, frail, chronically ill looking lying uncomfortably in bed in some painful distress.  Oral mucosa moist. Respiratory system: Clear to auscultation.  No increased work of breathing. Cardiovascular system: S1 & S2 heard,  RRR. No JVD, murmurs, rubs, gallops or clicks. No pedal edema. Gastrointestinal system: Abdomen is nondistended, lower abdominal mild tenderness with voluntary guarding but no rigidity or rebound.  No organomegaly or masses appreciated.  Normal bowel sounds heard. Central nervous system: Alert and oriented. No focal neurological deficits. Extremities: Symmetric 5 x 5 power.  Left femoral central line site without acute findings. Skin: No rashes, lesions or ulcers Psychiatry: Judgement and insight appear normal. Mood & affect anxious and in pain.     Data Reviewed:   I have personally reviewed following labs and imaging studies   CBC: Recent Labs  Lab 01/02/20 0231 01/02/20 0231 01/03/20 0317 01/04/20 0341 01/06/20 0229  WBC 8.1  8.1   < > 8.1 6.1 3.4*  NEUTROABS 7.6  --  6.6 4.4  --   HGB 8.4*  8.5*   < > 8.0* 7.5* 8.0*  HCT 26.0*  26.3*   < > 24.6* 24.2* 24.8*  MCV 94.2  93.3   < > 94.3 96.0 91.5  PLT 190  191   < > 116* 144* 140*   < > = values in this interval not displayed.    Basic Metabolic Panel: Recent Labs  Lab 01/05/20 0500 01/06/20 0229 01/07/20  0516  NA 139 138 141  K 4.0 4.0 4.3  CL 107 110 106  CO2 23 22 22   GLUCOSE 96 122* 125*  BUN 14 9 14   CREATININE 1.53* 1.28* 1.22*  CALCIUM 8.9 8.8* 9.7  MG 1.8 1.6* 1.9  PHOS 2.9 2.6 3.0    Liver Function Tests: Recent Labs  Lab 01/04/20 0341 01/05/20 0500 01/07/20 0516  AST 48* 27 22  ALT 163* 119* 76*  ALKPHOS 213* 232* 251*  BILITOT 0.9 1.1 0.6  PROT 5.9* 6.2* 7.0  ALBUMIN 2.3* 2.3* 2.8*    CBG: Recent Labs  Lab 01/07/20 0407 01/07/20 0730 01/07/20 1317  GLUCAP 114* 120* 99    Microbiology Studies:   Recent Results (from the past 240 hour(s))  Blood culture (routine x 2)     Status: Abnormal   Collection Time: 01/02/20  5:30 AM   Specimen: BLOOD  Result Value Ref Range Status   Specimen Description BLOOD RIGHT ANTECUBITAL  Final   Special Requests   Final    BOTTLES DRAWN  AEROBIC AND ANAEROBIC Blood Culture results may not be optimal due to an inadequate volume of blood received in culture bottles   Culture  Setup Time   Final    GRAM NEGATIVE RODS IN BOTH AEROBIC AND ANAEROBIC BOTTLES CRITICAL RESULT CALLED TO, READ BACK BY AND VERIFIED WITH: PHARMD LYDIA C. [phone] FCP Performed at [facility], [place] [address], [place], [place] [address]    Culture ENTEROBACTER CLOACAE (A)  Final   Report Status 01/05/2020 FINAL  Final   Organism ID, Bacteria ENTEROBACTER CLOACAE  Final      Susceptibility   Enterobacter cloacae - MIC*    CEFAZOLIN >=64 RESISTANT Resistant     CEFEPIME <=0.12 SENSITIVE Sensitive     CEFTAZIDIME <=1 SENSITIVE Sensitive     CIPROFLOXACIN <=0.25 SENSITIVE Sensitive     GENTAMICIN <=1 SENSITIVE Sensitive     IMIPENEM <=0.25 SENSITIVE Sensitive     TRIMETH/SULFA <=20 SENSITIVE Sensitive     PIP/TAZO 8 SENSITIVE Sensitive     * ENTEROBACTER CLOACAE  Blood culture (routine x 2)     Status: Abnormal   Collection Time: 01/02/20  5:30 AM   Specimen: BLOOD  Result Value Ref Range Status   Specimen Description BLOOD LEFT ANTECUBITAL  Final   Special Requests   Final    BOTTLES DRAWN AEROBIC AND ANAEROBIC Blood Culture adequate volume   Culture  Setup Time   Final    GRAM NEGATIVE RODS IN BOTH AEROBIC AND ANAEROBIC BOTTLES    Culture (A)  Final    ENTEROBACTER CLOACAE SUSCEPTIBILITIES PERFORMED ON PREVIOUS CULTURE WITHIN THE LAST 5 DAYS. Performed at [facility], [place] [address], [place], [place] [address]    Report Status 01/05/2020 FINAL  Final  Blood Culture ID Panel (Reflexed)     Status: Abnormal   Collection Time: 01/02/20  5:30 AM  Result Value Ref Range Status   Enterococcus species NOT DETECTED NOT DETECTED Final   Listeria monocytogenes NOT DETECTED NOT DETECTED Final   Staphylococcus species NOT DETECTED NOT DETECTED Final   Staphylococcus aureus (BCID) NOT DETECTED NOT DETECTED Final   Streptococcus  species NOT DETECTED NOT DETECTED Final   Streptococcus agalactiae NOT DETECTED NOT DETECTED Final   Streptococcus pneumoniae NOT DETECTED NOT DETECTED Final   Streptococcus pyogenes NOT DETECTED NOT DETECTED Final   Acinetobacter baumannii NOT DETECTED NOT DETECTED Final   Enterobacteriaceae species DETECTED (A) NOT  DETECTED Final    Comment: Enterobacteriaceae represent a large family of gram-negative bacteria, not a single organism. CRITICAL RESULT CALLED TO, READ BACK BY AND VERIFIED WITH: PHARMD LYDIA C. [id number] FCP    Enterobacter cloacae complex DETECTED (A) NOT DETECTED Final    Comment: CRITICAL RESULT CALLED TO, READ BACK BY AND VERIFIED WITH: PHARMD LYDIA C. [phone] FCP    Escherichia coli NOT DETECTED NOT DETECTED Final   Klebsiella oxytoca NOT DETECTED NOT DETECTED Final   Klebsiella pneumoniae NOT DETECTED NOT DETECTED Final   Proteus species NOT DETECTED NOT DETECTED Final   Serratia marcescens NOT DETECTED NOT DETECTED Final   Carbapenem resistance NOT DETECTED NOT DETECTED Final   Haemophilus influenzae NOT DETECTED NOT DETECTED Final   Neisseria meningitidis NOT DETECTED NOT DETECTED Final   Pseudomonas aeruginosa NOT DETECTED NOT DETECTED Final   Candida albicans NOT DETECTED NOT DETECTED Final   Candida glabrata NOT DETECTED NOT DETECTED Final   Candida krusei NOT DETECTED NOT DETECTED Final   Candida parapsilosis NOT DETECTED NOT DETECTED Final   Candida tropicalis NOT DETECTED NOT DETECTED Final    Comment: Performed at [facility], [place] [address], [place], [place] [address]  Novel Coronavirus, NAA (Hosp order, Send-out to Ref Lab; TAT 18-24 hrs     Status: None   Collection Time: 01/02/20  7:48 AM   Specimen: Nasopharyngeal Swab; Respiratory  Result Value Ref Range Status   SARS-CoV-2, NAA NOT DETECTED NOT DETECTED Final    Comment: (NOTE) This nucleic acid amplification test was developed and its performance characteristics determined by  [employer]. Nucleic acid amplification tests include RT-PCR and TMA. This test has not been FDA cleared or approved. This test has been authorized by FDA under an Emergency Use Authorization (EUA). This test is only authorized for the duration of time the declaration that circumstances exist justifying the authorization of the emergency use of in vitro diagnostic tests for detection of SARS-CoV-2 virus and/or diagnosis of COVID-19 infection under section 564(b)(1) of the Act, 21 U.S.C. 572IOM-3(T) (1), unless the authorization is terminated or revoked sooner. When diagnostic testing is negative, the possibility of a false negative result should be considered in the context of a patient's recent exposures and the presence of clinical signs and symptoms consistent with COVID-19. An individual without symptoms of COVID- 19 and who is not shedding SARS-CoV-2  virus would expect to have a negative (not detected) result in this assay. Performed At: [facility] [address] [place], [place] [id number] Rush Farmer MD [place]:[id number]    [place]  Final    Comment: Performed at [facility], [place] [address] [place], [place] [address]  C difficile quick scan w PCR reflex     Status: None   Collection Time: 01/03/20 12:11 AM   Specimen: STOOL  Result Value Ref Range Status   C Diff antigen NEGATIVE NEGATIVE Final   C Diff toxin NEGATIVE NEGATIVE Final   C Diff interpretation No C. difficile detected.  Final    Comment: Performed at [facility], [place] [address], [place], [place] [address]  GI pathogen panel by PCR, stool     Status: None   Collection Time: 01/03/20 12:11 AM   Specimen: STOOL  Result Value Ref Range Status   Plesiomonas shigelloides NOT DETECTED NOT DETECTED Final   Yersinia enterocolitica NOT DETECTED NOT DETECTED Final   Vibrio NOT DETECTED NOT DETECTED Final   Enteropathogenic E coli  NOT DETECTED NOT DETECTED Final     E coli (ETEC) LT/ST NOT DETECTED NOT DETECTED Final   E coli 4580 by PCR Not applicable NOT DETECTED Final   Cryptosporidium by PCR NOT DETECTED NOT DETECTED Final   Entamoeba histolytica NOT DETECTED NOT DETECTED Final   Adenovirus F 40/41 NOT DETECTED NOT DETECTED Final   Norovirus GI/GII NOT DETECTED NOT DETECTED Final   Sapovirus NOT DETECTED NOT DETECTED Final    Comment: (NOTE) Performed At: [facility] [place], [place] [id number] Rush Farmer MD NL:[id number]    Vibrio cholerae NOT DETECTED NOT DETECTED Final   Campylobacter by PCR NOT DETECTED NOT DETECTED Final   Salmonella by PCR NOT DETECTED NOT DETECTED Final   E coli (STEC) NOT DETECTED NOT DETECTED Final   Enteroaggregative E coli NOT DETECTED NOT DETECTED Final   Shigella by PCR NOT DETECTED NOT DETECTED Final   Cyclospora cayetanensis NOT DETECTED NOT DETECTED Final   Astrovirus NOT DETECTED NOT DETECTED Final   G lamblia by PCR NOT DETECTED NOT DETECTED Final   Rotavirus A by PCR NOT DETECTED NOT DETECTED Final  Culture, blood (Routine X 2) w Reflex to ID Panel     Status: None (Preliminary result)   Collection Time: 01/04/20  6:47 AM   Specimen: BLOOD RIGHT HAND  Result Value Ref Range Status   Specimen Description BLOOD RIGHT HAND  Final   Special Requests   Final    BOTTLES DRAWN AEROBIC ONLY Blood Culture results may not be optimal due to an inadequate volume of blood received in culture bottles   Culture   Final    NO GROWTH 3 DAYS Performed at [facility], [address] [place], [place] [address]    Report Status PENDING  Incomplete     Radiology Studies:  No results found.   Scheduled Meds:   . acidophilus  1 capsule Oral Daily  . amLODipine  5 mg Oral Daily  . budesonide  9 mg Oral Daily  . buPROPion  200 mg Oral Daily  . chlorhexidine      . Chlorhexidine Gluconate Cloth  6 each Topical Daily  . cycloSPORINE  1 drop Both Eyes BID  . dicyclomine  20 mg  Oral TID  . DULoxetine  90 mg Oral Daily  . estradiol  2 mg Oral Daily  . famotidine  20 mg Oral QHS  . feeding supplement  1 Container Oral TID BM  . hydroxypropyl methylcellulose / hypromellose  1 drop Both Eyes QID  . lidocaine      . lipase/protease/amylase  108,000 Units Oral TID AC  . metoprolol tartrate  100 mg Oral BID  . pantoprazole  40 mg Oral Daily  . potassium chloride SA  40 mEq Oral Daily  . sodium chloride flush  3 mL Intravenous Q12H  . sucralfate  1 g Oral TID WC & HS    Continuous Infusions:   . ertapenem 1,000 mg (01/06/20 1302)  . TPN CYCLIC-ADULT (ION) 973 mL/hr at 01/06/20 1949  . TPN CYCLIC-ADULT (ION)       LOS: 5 days     Vernell Leep, MD, [place], [facility]. [facility]    To contact the attending provider between 7A-7P or the covering provider during after hours 7P-7A, please log into the web site [email or web address] and access using universal [place] password for that web site. If you do not have the password, please call the hospital operator.  01/07/2020, 1:48  PM

## 2020-01-07 NOTE — Progress Notes (Signed)
PHARMACY - TOTAL PARENTERAL NUTRITION CONSULT NOTE   Indication: Short bowel syndrome  Patient Measurements: Height: 5' 8"  (172.7 cm) Weight: 132 lb 9.6 oz (60.1 kg) IBW/kg (Calculated) : 63.9 TPN AdjBW (KG): 62.4 Body mass index is 20.16 kg/m.  Assessment:  63 yof with history of complex Crohn's disease on TPN PTA for short gut syndrome presenting 01/02/20 with abdominal pain and diarrhea. Patient reportedly has access issues and per H&P, had her femoral PICC changed 2 days PTA.   Patient has recurrent bacteremia with retained line. BCx from 01/02/20 are growing enterobacter in 4 of 4 bottles. ID and GI are following. Patient may need line replacement but difficult case with previous access issues. GI started CLD.  Glucose / Insulin: 110s - 120s Electrolytes: K 4.3, Mag 1.9, Phos 3 Renal: Scr 1.53>1.28>1.22; UOP 2 ml/kg/hr LFTs / TGs: WNL; TGs 214 Prealbumin / albumin: pre 18.5/ alb 2.8 Intake / Output; MIVF: 600 mls urine + 2 undocumented urine occurances, 1 stool, MIVF off GI Imaging: Surgeries / Procedures:   Central access: PICC 11/16 - order placed to exchange line 2/11 TPN start date: TPN 2/9  Nutritional Goals (per RD recommendation on 2/8): Kcal:  1950-2150 Protein:  95-110 grams Fluid:  > 1.9 L  Patient was on home TPN which was proving 1780 kcals and 100 gms of protein per day in 1800 mls.  It was being cycled over 21 hours per day.   Will cycle TPN over 20 hours. 51 ml/hr x 1 hr then 101 ml/hr x 18 hr then 51 ml/hr x 1 hr (provides 100 g of protein and 2060 kcals per day)  Current Nutrition:  CLD  Plan:  Will cycle TPN over 20 hours. 51 ml/hr x 1 hr then 101 ml/hr x 18 hr then 51 ml/hr x 1 hr (provides 100 g of protein and 2060 kcals per day) Electrolytes in TPN: 37mq/L of Na, 540m/L of K, 71m64mL of Ca, 7mE771m of Mg, and 171mm11m of Phos. Cl:Ac ratio 1:1 Add standard MVI on MWF due to national shortage and trace elements to TPN No evidence of diabetes, will not  start SSI, but will monitor FSBS Monitor TPN labs on Mon/Thurs   CathyAlanda SlimrmD, FCCM [facility] Pharmacist Please see AMION for all Pharmacists' Contact Phone Numbers 01/07/2020, 7:20 AM

## 2020-01-07 NOTE — Progress Notes (Addendum)
Progress Note    ASSESSMENT AND PLAN:   62. 59 yo female with complex Crohn's disease s/p multiple bowel surgeries / short gut on chronic TPN. Maintained on Entocort.  Admitted days ago with acute on chronic abdomina pain and diarrhea.  CT scan suggested left sided colitis and unchanged (or maybe improved) compared to scan in December and January.  Called back to see patient for worsening lower abdominal pain with associated nausea. The lower abdominal cramping occurs prior to and during defecation, present since December. Etiology of pain unclear.  --I don't think repeat imaging would be helpful at this point .   --Bowel movements at baseline except darker than normal. Doubt blood, hgb has been stable for days --She is getting fentanyl and Dilaudid. --on chronic bentyl   -- ? Consider try of Amitriptyline for chronic pain. Dr. Loletha Carrow to see later today  2. ? Hx of ascending cholangitis late December. Underwent ERCP >> papillary stenosis, choledocholithiasis / sludge. Got sphincterotomy and balloon extraction. No evidence for PSC stricture. Liver tests normalized. ID following. On Invanz. IR exchanged catheter today.   3.  Enterobacter bacteremia possibly from chronic central line.    SUBJECTIVE    Lower abdominal cramping starts prior to defecation and last several minutes afterwards. Stools are baseline as far as frequency and consistency but are darker than normal. This pain has been present since December but getting more intense and she has associated nausea.   OBJECTIVE:     Vital signs in last 24 hours: Temp:  [98.5 F (36.9 C)-98.9 F (37.2 C)] 98.5 F (36.9 C) (02/11 1400) Pulse Rate:  [55-122] 57 (02/11 1400) Resp:  [12-19] 16 (02/11 1400) BP: (121-191)/(71-93) 150/71 (02/11 1400) SpO2:  [100 %] 100 % (02/11 1400) Last BM Date: 01/07/20 General:   Alert, well-developed female in NAD EENT:  Normal hearing, non icteric sclera   Heart:  Regular rate and rhythm;  No  lower extremity edema   Pulm: Normal respiratory effort   Abdomen:  Soft, nondistended, nontender.  Normal bowel sounds.          Neurologic:  Alert and  oriented x4;  grossly normal neurologically. Psych:  Pleasant, cooperative.  Normal mood and affect.   Intake/Output from previous day: 02/10 0701 - 02/11 0700 In: 2865.3 [P.O.:480; I.V.:2235.3; IV Piggyback:150] Out: 1200 [Urine:1200] Intake/Output this shift: Total I/O In: -  Out: 450 [Urine:450]  Lab Results: Recent Labs    01/06/20 0229  WBC 3.4*  HGB 8.0*  HCT 24.8*  PLT 140*   BMET Recent Labs    01/05/20 0500 01/06/20 0229 01/07/20 0516  NA 139 138 141  K 4.0 4.0 4.3  CL 107 110 106  CO2 23 22 22   GLUCOSE 96 122* 125*  BUN 14 9 14   CREATININE 1.53* 1.28* 1.22*  CALCIUM 8.9 8.8* 9.7   LFT Recent Labs    01/07/20 0516  PROT 7.0  ALBUMIN 2.8*  AST 22  ALT 76*  ALKPHOS 251*  BILITOT 0.6   PT/INR No results for input(s): LABPROT, INR in the last 72 hours. Hepatitis Panel No results for input(s): HEPBSAG, HCVAB, HEPAIGM, HEPBIGM in the last 72 hours.  No results found.  Principal Problem:   Crohn's colitis ([place]) Active Problems:   Fever   Short gut syndrome   HTN (hypertension)   GERD (gastroesophageal reflux disease)   Chronic pain syndrome   Acute kidney injury superimposed on chronic kidney disease ([place])   On total  parenteral nutrition (TPN)   Elevated liver enzymes   Gram-negative bacteremia   Infection of peripherally inserted central venous catheter (PICC)     LOS: 5 days   Tye Savoy ,NP 01/07/2020, 3:20 PM  I have discussed the case with the PA, and that is the plan I formulated. I personally interviewed and examined the patient.  This is a manifestation of the patient's waxing and waning chronic abdominal pain related to multiple prior surgeries and severe Crohn's disease.  It does not seem to represent a new process.  No additional testing planned at present.  I  recommended she speak with her chronic pain specialist when she is due to see them in several weeks and consider adjunctive medicine such as SSRI/SNRI, TCA, gabapentin for example.  I would not recommend starting such things in the hospital, given her medical complexity and need for close follow-up with a dedicated pain specialist.  Total time 20 minutes  Nelida Meuse III Office: [phone]

## 2020-01-07 NOTE — Progress Notes (Signed)
[facility] notified that GI panel is negative inquired if Enteric precautions be discontinued. Arthor Captain LPN

## 2020-01-07 NOTE — Progress Notes (Signed)
[facility] notified that patient has BP 191/87 HR 61

## 2020-01-08 LAB — GLUCOSE, CAPILLARY
Glucose-Capillary: 101 mg/dL — ABNORMAL HIGH (ref 70–99)
Glucose-Capillary: 104 mg/dL — ABNORMAL HIGH (ref 70–99)
Glucose-Capillary: 116 mg/dL — ABNORMAL HIGH (ref 70–99)
Glucose-Capillary: 75 mg/dL (ref 70–99)
Glucose-Capillary: 92 mg/dL (ref 70–99)
Glucose-Capillary: 96 mg/dL (ref 70–99)

## 2020-01-08 LAB — COMPREHENSIVE METABOLIC PANEL
ALT: 53 U/L — ABNORMAL HIGH (ref 0–44)
AST: 18 U/L (ref 15–41)
Albumin: 2.4 g/dL — ABNORMAL LOW (ref 3.5–5.0)
Alkaline Phosphatase: 233 U/L — ABNORMAL HIGH (ref 38–126)
Anion gap: 6 (ref 5–15)
BUN: 26 mg/dL — ABNORMAL HIGH (ref 6–20)
CO2: 21 mmol/L — ABNORMAL LOW (ref 22–32)
Calcium: 9.2 mg/dL (ref 8.9–10.3)
Chloride: 111 mmol/L (ref 98–111)
Creatinine, Ser: 1.43 mg/dL — ABNORMAL HIGH (ref 0.44–1.00)
GFR calc Af Amer: 47 mL/min — ABNORMAL LOW (ref >60)
GFR calc non Af Amer: 40 mL/min — ABNORMAL LOW (ref >60)
Glucose, Bld: 120 mg/dL — ABNORMAL HIGH (ref 70–99)
Potassium: 4.4 mmol/L (ref 3.5–5.1)
Sodium: 138 mmol/L (ref 135–145)
Total Bilirubin: 0.4 mg/dL (ref 0.3–1.2)
Total Protein: 6.2 g/dL — ABNORMAL LOW (ref 6.5–8.1)

## 2020-01-08 LAB — CBC
HCT: 24.5 % — ABNORMAL LOW (ref 36.0–46.0)
Hemoglobin: 8.1 g/dL — ABNORMAL LOW (ref 12.0–15.0)
MCH: 30.3 pg (ref 26.0–34.0)
MCHC: 33.1 g/dL (ref 30.0–36.0)
MCV: 91.8 fL (ref 80.0–100.0)
Platelets: 177 10*3/uL (ref 150–400)
RBC: 2.67 MIL/uL — ABNORMAL LOW (ref 3.87–5.11)
RDW: 13.3 % (ref 11.5–15.5)
WBC: 4.8 10*3/uL (ref 4.0–10.5)
nRBC: 0 % (ref 0.0–0.2)

## 2020-01-08 MED ORDER — ENOXAPARIN SODIUM 40 MG/0.4ML ~~LOC~~ SOLN
40.0000 mg | Freq: Every day | SUBCUTANEOUS | Status: DC
  Administered 2020-01-08 – 2020-01-11 (×4): 40 mg via SUBCUTANEOUS
  Filled 2020-01-08 (×5): qty 0.4

## 2020-01-08 MED ORDER — METHADONE HCL 10 MG PO TABS
10.0000 mg | ORAL_TABLET | Freq: Three times a day (TID) | ORAL | Status: DC
  Administered 2020-01-08 – 2020-01-11 (×10): 10 mg via ORAL
  Filled 2020-01-08 (×10): qty 1

## 2020-01-08 MED ORDER — SODIUM CHLORIDE 0.9 % IV SOLN: INTRAVENOUS | Status: AC

## 2020-01-08 MED ORDER — TRAVASOL 10 % IV SOLN
INTRAVENOUS | Status: AC
  Filled 2020-01-08: qty 998.4

## 2020-01-08 MED ORDER — HYDROMORPHONE HCL 1 MG/ML IJ SOLN
1.0000 mg | INTRAMUSCULAR | Status: DC | PRN
  Administered 2020-01-08 – 2020-01-11 (×18): 1 mg via INTRAVENOUS
  Filled 2020-01-08 (×18): qty 1

## 2020-01-08 NOTE — Progress Notes (Addendum)
PROGRESS NOTE   Caitlin Peters  QJJ:[id number]    DOB: Aug 22, 1961    DOA: 01/02/2020  PCP: [name], [name], MD   I have briefly reviewed patients previous medical records in [facility].  Chief Complaint:   Chief Complaint  Patient presents with  . Abdominal Pain    Brief Narrative:  59 year old female with PMH of Crohn's disease, s/p short gut syndrome (multiple previous surgeries, SBO), home TPN dependent (since 2003) via left femoral tunneled CVC, recurrent bacteremia, HTN, chronic anemia requiring PRBC transfusions, chronic pain, depression, osteoporosis, GERD presented to ED on 2/6 with diarrhea consistent with her Crohn's disease, fever.  In ED febrile to 101 F.  CT showed biliary dilatation postcholecystectomy and possible colitis.  Found to be bacteremic.  ID, GI and IR consulted.  S/p exchange of tunneled left CFV cuffed central venous catheter by IR on 2/11.  Appears to have some acute kidney injury today, treating with IV fluids and follow BMP in a.m.   Assessment & Plan:  Principal Problem:   Crohn's colitis ([place]) Active Problems:   Fever   Short gut syndrome   HTN (hypertension)   GERD (gastroesophageal reflux disease)   Chronic pain syndrome   Acute kidney injury superimposed on chronic kidney disease ([name])   On total parenteral nutrition (TPN)   Elevated liver enzymes   Gram-negative bacteremia   Infection of peripherally inserted central venous catheter (PICC)   Recurrent Enterobacter bacteremia: Suspected due to line infection (did not meet sepsis criteria) versus less likely from GI source.  Left femoral CVC placed in 08/2019 in [place].  Reportedly difficult vascular access and hence femoral placed at that time.  Cycles TPN at home.  ID consulted, recommend continuing ertapenem, surveillance blood cultures after 4 days from 2/8: Negative to date.  S/p exchange of tunneled left CFV central venous catheter by IR on 2/11.  ID recommends continued  IV ertapenem per OPAT protocol through 01/21/2020 and has follow-up appointment with ID on 01/27/2020 at 10:15 AM.  Crohn's disease, S/p multiple bowel surgery/short gut syndrome on chronic TPN, chronic diarrhea:.  CT abdomen 2/6 suggested possible colitis, no SBO or pneumoperitoneum.  Mildly elevated LFTs have almost normalized.  Acute hepatitis panel negative. C. difficile and GI panel PCR negative. Currently on Entocort and IV ertapenem.  On 2/11 reported worsening abdominal pain, evaluated by [facility], indicate that this is a manifestation of the patient's waxing and waning chronic abdominal pain related to her complex Crohn's disease and not a new process, no further testing planned and recommend outpatient follow-up with her pain MD for management.  Improved today.  Abnormal LFTs: [facility] input 2/9 appreciated.  Extensively evaluated during recent admission for same.  Now with recurrent elevated LFTs with enzymes in the 400s.  MRCP showed mild intra-/extrahepatic biliary ductal dilatation to the level of ampulla and diffusely dilated pancreatic duct.  GI indicates that this may be due to a passed stone which may explain her abnormal liver tests and bacteremia.  No ERCP planned.  Considering EUS at some point.  Enzymes have almost normalized.  Acute kidney injury complicating stage III chronic kidney disease: Creatinine has improved from 1.5-1.28, likely her baseline.  Creatinine has again worsened from 1.2-1.4.  May be related to poor oral intake yesterday and holding TPN for changing central line.  Brief and gentle IV fluids today.  Continue TPN.  Follow BMP in a.m.  Anemia of chronic disease: Hemoglobin stable in the 8  g range.  Pancytopenia resolved.  Follow CBC periodically.  Thrombocytopenia: Resolved today.  Essential hypertension: Currently on amlodipine 5 mg daily and metoprolol 100 mg twice daily.  BP better today.  Intermittent elevations may be related to pain issues.  Severe protein  calorie malnutrition: On chronic TPN, continue.  Discussed with pharmacy.  Chronic pain syndrome: Judicious use of opioids.  Patient reports that she follows with Dr. Royce Macadamia, pain management.  I confirmed with pharmacy that patient does actually take methadone scheduled and as needed Dilaudid at home.  Resume methadone and cut back on IV Dilaudid in preparation for discharge home on prior home regimen tomorrow.  Patient agrees.  Right lower lobe 6 mm pulmonary nodule: Noted on CT on admission.  Outpatient follow-up as per radiology recommendations.  Body mass index is 20.16 kg/m.  Nutritional Status Nutrition Problem: Increased nutrient needs Etiology: chronic illness(short gut sundrome secondary to crohn's disease) Signs/Symptoms: estimated needs Interventions: Boost B[place]ze, Refer to RD note for recommendations  DVT prophylaxis: Lovenox Code Status: Full Family Communication: None at bedside.  I discussed in detail with patient's daughter via phone, updated care and answered questions. Disposition:  . Patient came from: Home           . Anticipated d/c place: Home . Barriers to d/c: Patient with complex chron's disease, short gut syndrome from multiple surgeries, home TPN dependent, admitted for bacteremia suspected due to line sepsis, central line exchanged yesterday, abdominal pain is better but need to monitor while IV pain medicines are reduced and home methadone is resumed, also has developed new acute kidney injury requiring IV fluids and an additional day of monitoring and repeat labs.  Hopeful discharge home 2/13   Consultants:   ID [facility] IR  Procedures:   Ongoing TPN via left femoral tunneled central venous catheter. S/p exchange of tunneled left CFV cuffed central venous catheter by IR on 2/11.  Antimicrobials:   Zosyn 2/6-2/9 Ertapenem 2/9 >   Subjective:  Abdominal pain is better.  No new complaints reported.  Wishes to advance to soft diet.  Objective:    Vitals:   01/07/20 1400 01/07/20 2119 01/08/20 0401 01/08/20 1418  BP: (!) 150/71 (!) 146/77 (!) 193/78 135/62  Pulse: (!) 57 (!) 57 (!) 58 76  Resp: 16 16 18 18   Temp: 98.5 F (36.9 C) 98.8 F (37.1 C) 98.2 F (36.8 C) 98.4 F (36.9 C)  TempSrc: Oral Oral Oral Oral  SpO2: 100% 100% 100% 100%  Weight:      Height:       Patient was examined with a female nurse tech as chaperone in the room.  General exam: Pleasant middle-aged female, small built, frail, chronically ill looking sitting up comfortably in bed this morning.  Looks improved compared to yesterday.  Not in distress. Respiratory system: Clear to auscultation.  No increased work of breathing. Cardiovascular system: S1 and S2 heard, RRR.  No JVD, murmurs or pedal edema Gastrointestinal system: Abdomen is nondistended, soft, minimal lower abdominal tenderness without rigidity, guarding or rebound.  Normal bowel sounds heard. Central nervous system: Alert and oriented. No focal neurological deficits. Extremities: Symmetric 5 x 5 power.  New left femoral central line site without acute findings. Skin: No rashes, lesions or ulcers Psychiatry: Judgement and insight appear normal. Mood & affect anxious and in pain.     Data Reviewed:   I have personally reviewed following labs and imaging studies   CBC: Recent Labs  Lab 01/02/20 0231 01/02/20  0231 01/03/20 0317 01/03/20 0317 01/04/20 0341 01/06/20 0229 01/08/20 0447  WBC 8.1  8.1   < > 8.1   < > 6.1 3.4* 4.8  NEUTROABS 7.6  --  6.6  --  4.4  --   --   HGB 8.4*  8.5*   < > 8.0*   < > 7.5* 8.0* 8.1*  HCT 26.0*  26.3*   < > 24.6*   < > 24.2* 24.8* 24.5*  MCV 94.2  93.3   < > 94.3   < > 96.0 91.5 91.8  PLT 190  191   < > 116*   < > 144* 140* 177   < > = values in this interval not displayed.    Basic Metabolic Panel: Recent Labs  Lab 01/05/20 0500 01/05/20 0500 01/06/20 0229 01/07/20 0516 01/08/20 0447  NA 139   < > 138 141 138  K 4.0   < > 4.0 4.3  4.4  CL 107   < > 110 106 111  CO2 23   < > 22 22 21*  GLUCOSE 96   < > 122* 125* 120*  BUN 14   < > 9 14 26*  CREATININE 1.53*   < > 1.28* 1.22* 1.43*  CALCIUM 8.9   < > 8.8* 9.7 9.2  MG 1.8  --  1.6* 1.9  --   PHOS 2.9  --  2.6 3.0  --    < > = values in this interval not displayed.    Liver Function Tests: Recent Labs  Lab 01/05/20 0500 01/07/20 0516 01/08/20 0447  AST 27 22 18   ALT 119* 76* 53*  ALKPHOS 232* 251* 233*  BILITOT 1.1 0.6 0.4  PROT 6.2* 7.0 6.2*  ALBUMIN 2.3* 2.8* 2.4*    CBG: Recent Labs  Lab 01/08/20 0018 01/08/20 0358 01/08/20 0751  GLUCAP 116* 104* 96    Microbiology Studies:   Recent Results (from the past 240 hour(s))  Blood culture (routine x 2)     Status: Abnormal   Collection Time: 01/02/20  5:30 AM   Specimen: BLOOD  Result Value Ref Range Status   Specimen Description BLOOD RIGHT ANTECUBITAL  Final   Special Requests   Final    BOTTLES DRAWN AEROBIC AND ANAEROBIC Blood Culture results may not be optimal due to an inadequate volume of blood received in culture bottles   Culture  Setup Time   Final    GRAM NEGATIVE RODS IN BOTH AEROBIC AND ANAEROBIC BOTTLES CRITICAL RESULT CALLED TO, READ BACK BY AND VERIFIED WITH: PHARMD LYDIA C. [phone] FCP Performed at [facility], [place] [address], [place], [place] [address]    Culture ENTEROBACTER CLOACAE (A)  Final   Report Status 01/05/2020 FINAL  Final   Organism ID, Bacteria ENTEROBACTER CLOACAE  Final      Susceptibility   Enterobacter cloacae - MIC*    CEFAZOLIN >=64 RESISTANT Resistant     CEFEPIME <=0.12 SENSITIVE Sensitive     CEFTAZIDIME <=1 SENSITIVE Sensitive     CIPROFLOXACIN <=0.25 SENSITIVE Sensitive     GENTAMICIN <=1 SENSITIVE Sensitive     IMIPENEM <=0.25 SENSITIVE Sensitive     TRIMETH/SULFA <=20 SENSITIVE Sensitive     PIP/TAZO 8 SENSITIVE Sensitive     * ENTEROBACTER CLOACAE  Blood culture (routine x 2)     Status: Abnormal   Collection Time: 01/02/20  5:30  AM   Specimen: BLOOD  Result Value Ref Range Status  Specimen Description BLOOD LEFT ANTECUBITAL  Final   Special Requests   Final    BOTTLES DRAWN AEROBIC AND ANAEROBIC Blood Culture adequate volume   Culture  Setup Time   Final    GRAM NEGATIVE RODS IN BOTH AEROBIC AND ANAEROBIC BOTTLES    Culture (A)  Final    ENTEROBACTER CLOACAE SUSCEPTIBILITIES PERFORMED ON PREVIOUS CULTURE WITHIN THE LAST 5 DAYS. Performed at [facility], [place] [address], [place], [place] [address]    Report Status 01/05/2020 FINAL  Final  Blood Culture ID Panel (Reflexed)     Status: Abnormal   Collection Time: 01/02/20  5:30 AM  Result Value Ref Range Status   Enterococcus species NOT DETECTED NOT DETECTED Final   Listeria monocytogenes NOT DETECTED NOT DETECTED Final   Staphylococcus species NOT DETECTED NOT DETECTED Final   Staphylococcus aureus (BCID) NOT DETECTED NOT DETECTED Final   Streptococcus species NOT DETECTED NOT DETECTED Final   Streptococcus agalactiae NOT DETECTED NOT DETECTED Final   Streptococcus pneumoniae NOT DETECTED NOT DETECTED Final   Streptococcus pyogenes NOT DETECTED NOT DETECTED Final   Acinetobacter baumannii NOT DETECTED NOT DETECTED Final   Enterobacteriaceae species DETECTED (A) NOT DETECTED Final    Comment: Enterobacteriaceae represent a large family of gram-negative bacteria, not a single organism. CRITICAL RESULT CALLED TO, READ BACK BY AND VERIFIED WITH: PHARMD LYDIA C. [phone] FCP    Enterobacter cloacae complex DETECTED (A) NOT DETECTED Final    Comment: CRITICAL RESULT CALLED TO, READ BACK BY AND VERIFIED WITH: PHARMD LYDIA C. [id number] FCP    Escherichia coli NOT DETECTED NOT DETECTED Final   Klebsiella oxytoca NOT DETECTED NOT DETECTED Final   Klebsiella pneumoniae NOT DETECTED NOT DETECTED Final   Proteus species NOT DETECTED NOT DETECTED Final   Serratia marcescens NOT DETECTED NOT DETECTED Final   Carbapenem resistance NOT DETECTED NOT  DETECTED Final   Haemophilus influenzae NOT DETECTED NOT DETECTED Final   Neisseria meningitidis NOT DETECTED NOT DETECTED Final   Pseudomonas aeruginosa NOT DETECTED NOT DETECTED Final   Candida albicans NOT DETECTED NOT DETECTED Final   Candida glabrata NOT DETECTED NOT DETECTED Final   Candida krusei NOT DETECTED NOT DETECTED Final   Candida parapsilosis NOT DETECTED NOT DETECTED Final   Candida tropicalis NOT DETECTED NOT DETECTED Final    Comment: Performed at [facility], [place] [address] [place], [place] [address]  Novel Coronavirus, NAA (Hosp order, Send-out to Ref Lab; TAT 18-24 hrs     Status: None   Collection Time: 01/02/20  7:48 AM   Specimen: Nasopharyngeal Swab; Respiratory  Result Value Ref Range Status   SARS-CoV-2, NAA NOT DETECTED NOT DETECTED Final    Comment: (NOTE) This nucleic acid amplification test was developed and its performance characteristics determined by [employer]. Nucleic acid amplification tests include RT-PCR and TMA. This test has not been FDA cleared or approved. This test has been authorized by FDA under an Emergency Use Authorization (EUA). This test is only authorized for the duration of time the declaration that circumstances exist justifying the authorization of the emergency use of in vitro diagnostic tests for detection of SARS-CoV-2 virus and/or diagnosis of COVID-19 infection under section 564(b)(1) of the Act, 21 U.S.C. 237SEG-3(T) (1), unless the authorization is terminated or revoked sooner. When diagnostic testing is negative, the possibility of a false negative result should be considered in the context of a patient's recent exposures and the presence of clinical signs and symptoms  consistent with COVID-19. An individual without symptoms of COVID- 19 and who is not shedding SARS-CoV-2  virus would expect to have a negative (not detected) result in this assay. Performed At: [facility] [address]  [place], [place] [id number] Rush Farmer MD WE:[id number]    [place]  Final    Comment: Performed at [facility], [place] [address], [place], [place] [address]  C difficile quick scan w PCR reflex     Status: None   Collection Time: 01/03/20 12:11 AM   Specimen: STOOL  Result Value Ref Range Status   C Diff antigen NEGATIVE NEGATIVE Final   C Diff toxin NEGATIVE NEGATIVE Final   C Diff interpretation No C. difficile detected.  Final    Comment: Performed at [facility], [place] [address] [place], [place] [address]  GI pathogen panel by PCR, stool     Status: None   Collection Time: 01/03/20 12:11 AM   Specimen: STOOL  Result Value Ref Range Status   Plesiomonas shigelloides NOT DETECTED NOT DETECTED Final   Yersinia enterocolitica NOT DETECTED NOT DETECTED Final   Vibrio NOT DETECTED NOT DETECTED Final   Enteropathogenic E coli NOT DETECTED NOT DETECTED Final   E coli (ETEC) LT/ST NOT DETECTED NOT DETECTED Final   E coli 2458 by PCR Not applicable NOT DETECTED Final   Cryptosporidium by PCR NOT DETECTED NOT DETECTED Final   Entamoeba histolytica NOT DETECTED NOT DETECTED Final   Adenovirus F 40/41 NOT DETECTED NOT DETECTED Final   Norovirus GI/GII NOT DETECTED NOT DETECTED Final   Sapovirus NOT DETECTED NOT DETECTED Final    Comment: (NOTE) Performed At: [facility] [place], [place] [id number] Rush Farmer MD KN:[id number]    Vibrio cholerae NOT DETECTED NOT DETECTED Final   Campylobacter by PCR NOT DETECTED NOT DETECTED Final   Salmonella by PCR NOT DETECTED NOT DETECTED Final   E coli (STEC) NOT DETECTED NOT DETECTED Final   Enteroaggregative E coli NOT DETECTED NOT DETECTED Final   Shigella by PCR NOT DETECTED NOT DETECTED Final   Cyclospora cayetanensis NOT DETECTED NOT DETECTED Final   Astrovirus NOT DETECTED NOT DETECTED Final   G lamblia by PCR NOT DETECTED NOT DETECTED Final   Rotavirus A by PCR NOT  DETECTED NOT DETECTED Final  Culture, blood (Routine X 2) w Reflex to ID Panel     Status: None (Preliminary result)   Collection Time: 01/04/20  6:47 AM   Specimen: BLOOD RIGHT HAND  Result Value Ref Range Status   Specimen Description BLOOD RIGHT HAND  Final   Special Requests   Final    BOTTLES DRAWN AEROBIC ONLY Blood Culture results may not be optimal due to an inadequate volume of blood received in culture bottles   Culture   Final    NO GROWTH 4 DAYS Performed at [facility], [address] [place], [place] [address]    Report Status PENDING  Incomplete     Radiology Studies:  No results found.   Scheduled Meds:   . acidophilus  1 capsule Oral Daily  . amLODipine  5 mg Oral Daily  . budesonide  9 mg Oral Daily  . buPROPion  200 mg Oral Daily  . Chlorhexidine Gluconate Cloth  6 each Topical Daily  . cycloSPORINE  1 drop Both Eyes BID  . dicyclomine  20 mg Oral TID  . DULoxetine  90 mg Oral Daily  . enoxaparin (  LOVENOX) injection  40 mg Subcutaneous Daily  . estradiol  2 mg Oral Daily  . famotidine  20 mg Oral QHS  . feeding supplement  1 Container Oral TID BM  . hydroxypropyl methylcellulose / hypromellose  1 drop Both Eyes QID  . lipase/protease/amylase  108,000 Units Oral TID AC  . metoprolol tartrate  100 mg Oral BID  . pantoprazole  40 mg Oral Daily  . potassium chloride SA  40 mEq Oral Daily  . sodium chloride flush  3 mL Intravenous Q12H  . sucralfate  1 g Oral TID WC & HS    Continuous Infusions:   . ertapenem 1,000 mg (01/08/20 1351)  . TPN CYCLIC-ADULT (ION)       LOS: 6 days     Vernell Leep, MD, [place], [facility]. [facility]    To contact the attending provider between 7A-7P or the covering provider during after hours 7P-7A, please log into the web site [email or web address] and access using universal [place] password for that web site. If you do not have the password, please call the hospital operator.  01/08/2020, 2:58 PM

## 2020-01-08 NOTE — Plan of Care (Signed)
  Problem: Education: Goal: Knowledge of General Education information will improve Description: Including pain rating scale, medication(s)/side effects and non-pharmacologic comfort measures Outcome: Progressing   Problem: Health Behavior/Discharge Planning: Goal: Ability to manage health-related needs will improve Outcome: Progressing   Problem: Clinical Measurements: Goal: Ability to maintain clinical measurements within normal limits will improve Outcome: Progressing   Problem: Nutrition: Goal: Adequate nutrition will be maintained Outcome: Progressing   Problem: Elimination: Goal: Will not experience complications related to bowel motility Outcome: Progressing Goal: Will not experience complications related to urinary retention Outcome: Progressing   Problem: Pain Managment: Goal: General experience of comfort will improve Outcome: Progressing   Problem: Safety: Goal: Ability to remain free from injury will improve Outcome: Progressing   Problem: Skin Integrity: Goal: Risk for impaired skin integrity will decrease Outcome: Progressing

## 2020-01-08 NOTE — Progress Notes (Signed)
PHARMACY - TOTAL PARENTERAL NUTRITION CONSULT NOTE   Indication: Short bowel syndrome  Patient Measurements: Height: 5' 8"  (172.7 cm) Weight: 132 lb 9.6 oz (60.1 kg) IBW/kg (Calculated) : 63.9 TPN AdjBW (KG): 62.4 Body mass index is 20.16 kg/m.  Assessment:  66 yof with history of complex Crohn's disease on TPN PTA for short gut syndrome presenting 01/02/20 with abdominal pain and diarrhea. Patient reportedly has access issues and per H&P, had her femoral PICC changed 2 days PTA.   Patient has recurrent bacteremia with retained line. BCx from 01/02/20 are growing enterobacter in 4 of 4 bottles. ID and GI are following. Patient may need line replacement but difficult case with previous access issues. GI started CLD.  Glucose / Insulin: 110s - 120s Electrolytes: Na/K/Ca at goal, no mg and phos drawn today  Renal: Scr 1.43 (fluctuates 1.2-1.7); BUN 9-26; UOP not well documented LFTs / TGs: ALT trended down; TGs 214 Prealbumin / albumin: pre 18.5/ alb 2.4 Intake / Output; MIVF: 450 mls urine + 3 undocumented urine occurances, 3 stool, MIVF off GI Imaging: Surgeries / Procedures: 2/11 PICC exchange  Central access: PICC 11/16 - line exchanged 2/11 TPN start date: TPN 2/9 (also on it at home)   Nutritional Goals (per RD recommendation on 2/8): Kcal:  1950-2150 Protein:  95-110 grams Fluid:  > 1.9 L  Patient was on home TPN which was proving 1780 kcals and 100 gms of protein per day in 1800 mls.  It was being cycled over 21 hours per day.  Will cycle TPN over 20 hours. 51 ml/hr x 1 hr then 101 ml/hr x 18 hr then 51 ml/hr x 1 hr (provides 100 g of protein and 2060 kcals per day)  2/11 PM - TPN discarded after PICC exchange, held around 1400, no D10 given as BGs were normal   Current Nutrition:  CLD  Plan:  Will cycle TPN over 20 hours (close to home regimen). 51 ml/hr x 1 hr then 101 ml/hr x 18 hr then 51 ml/hr x 1 hr (provides 100 g of protein and 2060 kcals per day) Electrolytes  in TPN: continue 76mq/L of Na, 527m/L of K, 12m26mL of Ca, 7mE78m of Mg, and 112mm33m of Phos. Cl:Ac ratio 1:1 Add standard MVI on MWF due to national shortage and trace elements to TPN No evidence of diabetes, will not start SSI, but will monitor FSBS Monitor TPN labs on Mon/Thurs given very stable    LydiaBenetta SparrmD, BCPS, BCCP Clinical Pharmacist  Please check AMION for all [facility] numbers After 10:00 PM, call Main [place]

## 2020-01-09 LAB — GLUCOSE, CAPILLARY
Glucose-Capillary: 101 mg/dL — ABNORMAL HIGH (ref 70–99)
Glucose-Capillary: 107 mg/dL — ABNORMAL HIGH (ref 70–99)
Glucose-Capillary: 82 mg/dL (ref 70–99)
Glucose-Capillary: 85 mg/dL (ref 70–99)
Glucose-Capillary: 93 mg/dL (ref 70–99)
Glucose-Capillary: 97 mg/dL (ref 70–99)
Glucose-Capillary: 98 mg/dL (ref 70–99)

## 2020-01-09 LAB — CULTURE, BLOOD (ROUTINE X 2): Culture: NO GROWTH

## 2020-01-09 LAB — BASIC METABOLIC PANEL
Anion gap: 6 (ref 5–15)
BUN: 31 mg/dL — ABNORMAL HIGH (ref 6–20)
CO2: 20 mmol/L — ABNORMAL LOW (ref 22–32)
Calcium: 9.2 mg/dL (ref 8.9–10.3)
Chloride: 113 mmol/L — ABNORMAL HIGH (ref 98–111)
Creatinine, Ser: 1.56 mg/dL — ABNORMAL HIGH (ref 0.44–1.00)
GFR calc Af Amer: 42 mL/min — ABNORMAL LOW (ref >60)
GFR calc non Af Amer: 36 mL/min — ABNORMAL LOW (ref >60)
Glucose, Bld: 97 mg/dL (ref 70–99)
Potassium: 4.8 mmol/L (ref 3.5–5.1)
Sodium: 139 mmol/L (ref 135–145)

## 2020-01-09 MED ORDER — TRAVASOL 10 % IV SOLN
INTRAVENOUS | Status: AC
  Filled 2020-01-09: qty 998.4

## 2020-01-09 MED ORDER — SODIUM CHLORIDE 0.9 % IV SOLN: INTRAVENOUS | Status: DC

## 2020-01-09 NOTE — Progress Notes (Signed)
PHARMACY - TOTAL PARENTERAL NUTRITION CONSULT NOTE   Indication: Short bowel syndrome  Patient Measurements: Height: 5' 8"  (172.7 cm) Weight: 132 lb 9.6 oz (60.1 kg) IBW/kg (Calculated) : 63.9 TPN AdjBW (KG): 62.4 Body mass index is 20.16 kg/m.  Assessment:  58 yof with history of complex Crohn's disease on TPN PTA for short gut syndrome presenting 01/02/20 with abdominal pain and diarrhea. Patient reportedly has access issues and per H&P, had her femoral PICC changed 2 days PTA.   Patient has recurrent bacteremia with retained line. BCx from 01/02/20 are growing enterobacter in 4 of 4 bottles. ID and GI are following. Patient may need line replacement but difficult case with previous access issues. GI started CLD.  Glucose / Insulin: 96 - 101 Electrolytes: Na/K/Ca at goal, no mg and phos drawn today  Renal: Scr 1.43>1.56 (fluctuates 1.2-1.7); BUN 26>31; UOP not well documented LFTs / TGs: ALT trended down; TGs 214 Prealbumin / albumin: pre 18.5/ alb 2.4 Intake / Output; MIVF: 3 unmeasured urine occurances, 2 stools, MIVF off GI Imaging: Surgeries / Procedures: 2/11 PICC exchange  Central access: PICC 11/16 - line exchanged 2/11 TPN start date: TPN 2/9 (also on it at home)   Nutritional Goals (per RD recommendation on 2/8): Kcal:  1950-2150 Protein:  95-110 grams Fluid:  > 1.9 L  Patient was on home TPN which was proving 1780 kcals and 100 gms of protein per day in 1800 mls.  It was being cycled over 21 hours per day.  Will cycle TPN over 20 hours. 51 ml/hr x 1 hr then 101 ml/hr x 18 hr then 51 ml/hr x 1 hr (provides 100 g of protein and 2060 kcals per day)  Current Nutrition:  CLD  Plan:  Will cycle TPN over 20 hours (close to home regimen). 51 ml/hr x 1 hr then 101 ml/hr x 18 hr then 51 ml/hr x 1 hr (provides 100 g of protein and 2060 kcals per day) Electrolytes in TPN: continue 49mq/L of Na, 562m/L of K, 63m763mL of Ca, 7mE36m of Mg, and 163mm50m of Phos. Cl:Ac ratio  1:1 Add standard MVI on MWF due to national shortage and trace elements to TPN No evidence of diabetes, will not start SSI, but will monitor FSBS Monitor TPN labs    CathyAlanda SlimrmD, FCCM [facility] Pharmacist Please see AMION for all Pharmacists' Contact Phone Numbers 01/09/2020, 9:39 AM

## 2020-01-09 NOTE — Plan of Care (Signed)

## 2020-01-09 NOTE — Progress Notes (Signed)
PROGRESS NOTE   Caitlin Peters  UMP:[id number]    DOB: [birth year]    DOA: 01/02/2020  PCP: [name], [name], MD   I have briefly reviewed patients previous medical records in [facility].  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  In ED febrile to 101 F.  CT showed biliary dilatation postcholecystectomy and possible colitis.  Found to be bacteremic.  ID, GI and IR consulted.  S/p exchange of tunneled left CFV cuffed central venous catheter by IR on 2/11.  Appears to have some acute kidney injury which has worsened compared to yesterday despite IV fluids, increased IV fluids.  Follow BMP in a.m.   Assessment & Plan:  Principal Problem:   Crohn's colitis ([place]) Active Problems:   Fever   Short gut syndrome   HTN (hypertension)   GERD (gastroesophageal reflux disease)   Chronic pain syndrome   Acute kidney injury superimposed on chronic kidney disease ([name])   On total parenteral nutrition (TPN)   Elevated liver enzymes   Gram-negative bacteremia   Infection of peripherally inserted central venous catheter (PICC)   Recurrent Enterobacter bacteremia: Suspected due to line infection (did not meet sepsis criteria) versus less likely from GI source.  Left femoral CVC placed in 08/2019 in [place].  Reportedly difficult vascular access and hence femoral placed at that time.  Cycles TPN at home.  ID consulted, recommend continuing ertapenem, surveillance blood cultures after 4 days from 2/8: Negative to date.  S/p exchange of tunneled left CFV central venous  catheter by IR on 2/11.  ID recommends continued IV ertapenem per OPAT protocol through 01/21/2020 and has follow-up appointment with ID on 01/27/2020 at 10:15 AM.  Crohn's disease, S/p multiple bowel surgery/short gut syndrome on chronic TPN, chronic diarrhea:.  CT abdomen 2/6 suggested possible colitis, no SBO or pneumoperitoneum.  Mildly elevated LFTs have almost normalized.  Acute hepatitis panel negative. C. difficile and GI panel PCR negative. Currently on Entocort and IV ertapenem.  On 2/11 reported worsening abdominal pain, evaluated by [facility], indicate that this is a manifestation of the patient's waxing and waning chronic abdominal pain related to her complex Crohn's disease and not a new process, no further testing planned and recommend outpatient follow-up with her pain MD for management.  Patient reports much improved pain control after starting home dose of methadone.  Abnormal LFTs: [facility] input 2/9 appreciated.  Extensively evaluated during recent admission for same.  Now with recurrent elevated LFTs with enzymes in the 400s.  MRCP showed mild intra-/extrahepatic biliary ductal dilatation to the level of ampulla and diffusely dilated pancreatic duct.  GI indicates that this may be due to a passed stone which may explain her abnormal liver tests and bacteremia.  No ERCP planned.  Considering EUS at some point.  Enzymes have almost normalized.  Acute kidney injury complicating stage III chronic kidney disease: Creatinine has improved from 1.5-1.28, likely her baseline.  Creatinine has again worsened from 1.2-1.4.  Despite IV fluids yesterday, creatinine has further increased to 1.56.  Increased IV fluids.  Follow BMP in a.m.  Reviewed med list,  no nephrotoxic's noted.  Anemia of chronic disease: Hemoglobin stable in the 8 g range.  Pancytopenia resolved.  Follow CBC periodically.  Thrombocytopenia: Resolved today.  Essential hypertension: Currently on amlodipine 5 mg daily and  metoprolol 100 mg twice daily.  Better controlled.  Severe protein calorie malnutrition: On chronic TPN, continue.  Discussed with pharmacy.  Chronic pain syndrome: Judicious use of opioids.  Patient reports that she follows with Dr. Royce Macadamia, pain management.  Continue home dose of methadone.  Discussed with patient regarding minimizing IV Dilaudid use, prefers to continue as needed IV Dilaudid while hospitalized due to pain issues prior to resuming home oral medications at discharge.  Right lower lobe 6 mm pulmonary nodule: Noted on CT on admission.  Outpatient follow-up as per radiology recommendations.  Body mass index is 20.16 kg/m.  Nutritional Status Nutrition Problem: Increased nutrient needs Etiology: chronic illness(short gut sundrome secondary to crohn's disease) Signs/Symptoms: estimated needs Interventions: [place]ze, Refer to RD note for recommendations  DVT prophylaxis: Lovenox Code Status: Full Family Communication: None at bedside.  I discussed in detail with patient's daughter via phone, updated care and answered questions. Disposition:   Patient came from: Home            Anticipated d/c place: Home  Barriers to d/c: Worsening acute kidney injury.   Consultants:   ID [facility] IR  Procedures:   Ongoing TPN via left femoral tunneled central venous catheter. S/p exchange of tunneled left CFV cuffed central venous catheter by IR on 2/11.  Antimicrobials:   Zosyn 2/6-2/9 Ertapenem 2/9 >   Subjective:  "My pain is much better controlled".  Tolerating soft diet without nausea or vomiting.  Objective:   Vitals:   01/08/20 0401 01/08/20 1418 01/08/20 2147 01/09/20 0447  BP: (!) 193/78 135/62 (!) 153/62 (!) 155/66  Pulse: (!) 58 76 83 96  Resp: 18 18    Temp: 98.2 F (36.8 C) 98.4 F (36.9 C) 98.2 F (36.8 C) 98.3 F (36.8 C)  TempSrc: Oral Oral  Oral  SpO2: 100% 100% 100% 100%  Weight:      Height:       Patient examined along with female RN  in room.  General exam: Pleasant middle-aged female, small built, frail, chronically ill looking seen ambulating from her bed to her room door without difficulty.  Did not appear in any distress. Respiratory system: Clear to auscultation.  No increased work of breathing. Cardiovascular system: S1 and S2 heard, RRR.  No JVD, murmurs or pedal edema Gastrointestinal system: Abdomen is nondistended, soft and nontender.  Normal bowel sounds heard. Central nervous system: Alert and oriented. No focal neurological deficits. Extremities: Symmetric 5 x 5 power.  New left femoral central line site without acute findings.  No edema of extremities. Skin: No rashes, lesions or ulcers Psychiatry: Judgement and insight appear normal. Mood & affect anxious and in pain.     Data Reviewed:   I have personally reviewed following labs and imaging studies   CBC: Recent Labs  Lab 01/03/20 0317 01/03/20 0317 01/04/20 0341 01/06/20 0229 01/08/20 0447  WBC 8.1   < > 6.1 3.4* 4.8  NEUTROABS 6.6  --  4.4  --   --   HGB 8.0*   < > 7.5* 8.0* 8.1*  HCT 24.6*   < > 24.2* 24.8* 24.5*  MCV 94.3   < > 96.0 91.5 91.8  PLT 116*   < > 144* 140* 177   < > = values  in this interval not displayed.    Basic Metabolic Panel: Recent Labs  Lab 01/05/20 0500 01/05/20 0500 01/06/20 0229 01/06/20 0229 01/07/20 0516 01/08/20 0447 01/09/20 0401  NA 139   < > 138   < > 141 138 139  K 4.0   < > 4.0   < > 4.3 4.4 4.8  CL 107   < > 110   < > 106 111 113*  CO2 23   < > 22   < > 22 21* 20*  GLUCOSE 96   < > 122*   < > 125* 120* 97  BUN 14   < > 9   < > 14 26* 31*  CREATININE 1.53*   < > 1.28*   < > 1.22* 1.43* 1.56*  CALCIUM 8.9   < > 8.8*   < > 9.7 9.2 9.2  MG 1.8  --  1.6*  --  1.9  --   --   PHOS 2.9  --  2.6  --  3.0  --   --    < > = values in this interval not displayed.    Liver Function Tests: Recent Labs  Lab 01/05/20 0500 01/07/20 0516 01/08/20 0447  AST 27 22 18   ALT 119* 76* 53*  ALKPHOS 232*  251* 233*  BILITOT 1.1 0.6 0.4  PROT 6.2* 7.0 6.2*  ALBUMIN 2.3* 2.8* 2.4*    CBG: Recent Labs  Lab 01/09/20 0444 01/09/20 0725 01/09/20 1158  GLUCAP 97 101* 98    Microbiology Studies:   Recent Results (from the past 240 hour(s))  Blood culture (routine x 2)     Status: Abnormal   Collection Time: 01/02/20  5:30 AM   Specimen: BLOOD  Result Value Ref Range Status   Specimen Description BLOOD RIGHT ANTECUBITAL  Final   Special Requests   Final    BOTTLES DRAWN AEROBIC AND ANAEROBIC Blood Culture results may not be optimal due to an inadequate volume of blood received in culture bottles   Culture  Setup Time   Final    GRAM NEGATIVE RODS IN BOTH AEROBIC AND ANAEROBIC BOTTLES CRITICAL RESULT CALLED TO, READ BACK BY AND VERIFIED WITH: PHARMD LYDIA C. [id number] FCP Performed at [facility], [name] [address], [place], [place] [address]    Culture ENTEROBACTER CLOACAE (A)  Final   Report Status 01/05/2020 FINAL  Final   Organism ID, Bacteria ENTEROBACTER CLOACAE  Final      Susceptibility   Enterobacter cloacae - MIC*    CEFAZOLIN >=64 RESISTANT Resistant     CEFEPIME <=0.12 SENSITIVE Sensitive     CEFTAZIDIME <=1 SENSITIVE Sensitive     CIPROFLOXACIN <=0.25 SENSITIVE Sensitive     GENTAMICIN <=1 SENSITIVE Sensitive     IMIPENEM <=0.25 SENSITIVE Sensitive     TRIMETH/SULFA <=20 SENSITIVE Sensitive     PIP/TAZO 8 SENSITIVE Sensitive     * ENTEROBACTER CLOACAE  Blood culture (routine x 2)     Status: Abnormal   Collection Time: 01/02/20  5:30 AM   Specimen: BLOOD  Result Value Ref Range Status   Specimen Description BLOOD LEFT ANTECUBITAL  Final   Special Requests   Final    BOTTLES DRAWN AEROBIC AND ANAEROBIC Blood Culture adequate volume   Culture  Setup Time   Final    GRAM NEGATIVE RODS IN BOTH AEROBIC AND ANAEROBIC BOTTLES    Culture (A)  Final    ENTEROBACTER CLOACAE SUSCEPTIBILITIES PERFORMED ON PREVIOUS CULTURE  WITHIN THE LAST 5 DAYS. Performed at  [facility], [place] [address], [place], [place] [address]    Report Status 01/05/2020 FINAL  Final  Blood Culture ID Panel (Reflexed)     Status: Abnormal   Collection Time: 01/02/20  5:30 AM  Result Value Ref Range Status   Enterococcus species NOT DETECTED NOT DETECTED Final   Listeria monocytogenes NOT DETECTED NOT DETECTED Final   Staphylococcus species NOT DETECTED NOT DETECTED Final   Staphylococcus aureus (BCID) NOT DETECTED NOT DETECTED Final   Streptococcus species NOT DETECTED NOT DETECTED Final   Streptococcus agalactiae NOT DETECTED NOT DETECTED Final   Streptococcus pneumoniae NOT DETECTED NOT DETECTED Final   Streptococcus pyogenes NOT DETECTED NOT DETECTED Final   Acinetobacter baumannii NOT DETECTED NOT DETECTED Final   Enterobacteriaceae species DETECTED (A) NOT DETECTED Final    Comment: Enterobacteriaceae represent a large family of gram-negative bacteria, not a single organism. CRITICAL RESULT CALLED TO, READ BACK BY AND VERIFIED WITH: PHARMD LYDIA C. [id number] FCP    Enterobacter cloacae complex DETECTED (A) NOT DETECTED Final    Comment: CRITICAL RESULT CALLED TO, READ BACK BY AND VERIFIED WITH: PHARMD LYDIA C. [id number] FCP    Escherichia coli NOT DETECTED NOT DETECTED Final   Klebsiella oxytoca NOT DETECTED NOT DETECTED Final   Klebsiella pneumoniae NOT DETECTED NOT DETECTED Final   Proteus species NOT DETECTED NOT DETECTED Final   Serratia marcescens NOT DETECTED NOT DETECTED Final   Carbapenem resistance NOT DETECTED NOT DETECTED Final   Haemophilus influenzae NOT DETECTED NOT DETECTED Final   Neisseria meningitidis NOT DETECTED NOT DETECTED Final   Pseudomonas aeruginosa NOT DETECTED NOT DETECTED Final   Candida albicans NOT DETECTED NOT DETECTED Final   Candida glabrata NOT DETECTED NOT DETECTED Final   Candida krusei NOT DETECTED NOT DETECTED Final   Candida parapsilosis NOT DETECTED NOT DETECTED Final   Candida tropicalis NOT DETECTED NOT  DETECTED Final    Comment: Performed at [facility], [place] [address] [place], [place] [address]  Novel Coronavirus, NAA (Hosp order, Send-out to Ref Lab; TAT 18-24 hrs     Status: None   Collection Time: 01/02/20  7:48 AM   Specimen: Nasopharyngeal Swab; Respiratory  Result Value Ref Range Status   SARS-CoV-2, NAA NOT DETECTED NOT DETECTED Final    Comment: (NOTE) This nucleic acid amplification test was developed and its performance characteristics determined by [employer]. Nucleic acid amplification tests include RT-PCR and TMA. This test has not been FDA cleared or approved. This test has been authorized by FDA under an Emergency Use Authorization (EUA). This test is only authorized for the duration of time the declaration that circumstances exist justifying the authorization of the emergency use of in vitro diagnostic tests for detection of SARS-CoV-2 virus and/or diagnosis of COVID-19 infection under section 564(b)(1) of the Act, 21 U.S.C. 916XIH-0(T) (1), unless the authorization is terminated or revoked sooner. When diagnostic testing is negative, the possibility of a false negative result should be considered in the context of a patient's recent exposures and the presence of clinical signs and symptoms consistent with COVID-19. An individual without symptoms of COVID- 19 and who is not shedding SARS-CoV-2  virus would expect to have a negative (not detected) result in this assay. Performed At: [facility] [address] [place], [place] [id number] Rush Farmer MD JZ:[id number]    [place]  Final    Comment: Performed at [facility],  [address], [place], [place] [address]  C difficile quick scan w PCR reflex     Status: None   Collection Time: 01/03/20 12:11 AM   Specimen: STOOL  Result Value Ref Range Status   C Diff antigen NEGATIVE NEGATIVE Final   C Diff toxin NEGATIVE NEGATIVE Final   C Diff  interpretation No C. difficile detected.  Final    Comment: Performed at [facility], [place] [address], [place], [place] [address]  GI pathogen panel by PCR, stool     Status: None   Collection Time: 01/03/20 12:11 AM   Specimen: STOOL  Result Value Ref Range Status   Plesiomonas shigelloides NOT DETECTED NOT DETECTED Final   Yersinia enterocolitica NOT DETECTED NOT DETECTED Final   Vibrio NOT DETECTED NOT DETECTED Final   Enteropathogenic E coli NOT DETECTED NOT DETECTED Final   E coli (ETEC) LT/ST NOT DETECTED NOT DETECTED Final   E coli 7121 by PCR Not applicable NOT DETECTED Final   Cryptosporidium by PCR NOT DETECTED NOT DETECTED Final   Entamoeba histolytica NOT DETECTED NOT DETECTED Final   Adenovirus F 40/41 NOT DETECTED NOT DETECTED Final   Norovirus GI/GII NOT DETECTED NOT DETECTED Final   Sapovirus NOT DETECTED NOT DETECTED Final    Comment: (NOTE) Performed At: [facility] [place], [place] [id number] Rush Farmer MD DI:[id number]    Vibrio cholerae NOT DETECTED NOT DETECTED Final   Campylobacter by PCR NOT DETECTED NOT DETECTED Final   Salmonella by PCR NOT DETECTED NOT DETECTED Final   E coli (STEC) NOT DETECTED NOT DETECTED Final   Enteroaggregative E coli NOT DETECTED NOT DETECTED Final   Shigella by PCR NOT DETECTED NOT DETECTED Final   Cyclospora cayetanensis NOT DETECTED NOT DETECTED Final   Astrovirus NOT DETECTED NOT DETECTED Final   G lamblia by PCR NOT DETECTED NOT DETECTED Final   Rotavirus A by PCR NOT DETECTED NOT DETECTED Final  Culture, blood (Routine X 2) w Reflex to ID Panel     Status: None (Preliminary result)   Collection Time: 01/04/20  6:47 AM   Specimen: BLOOD RIGHT HAND  Result Value Ref Range Status   Specimen Description BLOOD RIGHT HAND  Final   Special Requests   Final    BOTTLES DRAWN AEROBIC ONLY Blood Culture results may not be optimal due to an inadequate volume of blood received in culture bottles    Culture   Final    NO GROWTH 4 DAYS Performed at [facility], [address] [place], [place] [address]    Report Status PENDING  Incomplete     Radiology Studies:  No results found.   Scheduled Meds:    acidophilus  1 capsule Oral Daily   amLODipine  5 mg Oral Daily   budesonide  9 mg Oral Daily   buPROPion  200 mg Oral Daily   Chlorhexidine Gluconate Cloth  6 each Topical Daily   cycloSPORINE  1 drop Both Eyes BID   dicyclomine  20 mg Oral TID   DULoxetine  90 mg Oral Daily   enoxaparin (LOVENOX) injection  40 mg Subcutaneous Daily   estradiol  2 mg Oral Daily   famotidine  20 mg Oral QHS   feeding supplement  1 Container Oral TID BM   hydroxypropyl methylcellulose / hypromellose  1 drop Both Eyes QID   lipase/protease/amylase  108,000 Units Oral TID AC   methadone  10 mg Oral Q8H  metoprolol tartrate  100 mg Oral BID   pantoprazole  40 mg Oral Daily   sodium chloride flush  3 mL Intravenous Q12H   sucralfate  1 g Oral TID WC & HS    Continuous Infusions:    sodium chloride 75 mL/hr at 01/09/20 1143   ertapenem 1,000 mg (74/12/87 8676)   TPN CYCLIC-ADULT (ION) 720 mL/hr at 94/70/96 2836   TPN CYCLIC-ADULT (ION)       LOS: 7 days     Vernell Leep, MD, [place], [facility]. [facility]    To contact the attending provider between 7A-7P or the covering provider during after hours 7P-7A, please log into the web site [email or web address] and access using universal [place] password for that web site. If you do not have the password, please call the hospital operator.  01/09/2020, 12:49 PM

## 2020-01-10 ENCOUNTER — Inpatient Hospital Stay (HOSPITAL_COMMUNITY): Payer: Medicare Other

## 2020-01-10 LAB — BASIC METABOLIC PANEL
Anion gap: 6 (ref 5–15)
BUN: 36 mg/dL — ABNORMAL HIGH (ref 6–20)
CO2: 19 mmol/L — ABNORMAL LOW (ref 22–32)
Calcium: 9 mg/dL (ref 8.9–10.3)
Chloride: 114 mmol/L — ABNORMAL HIGH (ref 98–111)
Creatinine, Ser: 1.71 mg/dL — ABNORMAL HIGH (ref 0.44–1.00)
GFR calc Af Amer: 38 mL/min — ABNORMAL LOW (ref >60)
GFR calc non Af Amer: 32 mL/min — ABNORMAL LOW (ref >60)
Glucose, Bld: 90 mg/dL (ref 70–99)
Potassium: 4.2 mmol/L (ref 3.5–5.1)
Sodium: 139 mmol/L (ref 135–145)

## 2020-01-10 LAB — GLUCOSE, CAPILLARY
Glucose-Capillary: 85 mg/dL (ref 70–99)
Glucose-Capillary: 84 mg/dL (ref 70–99)
Glucose-Capillary: 81 mg/dL (ref 70–99)
Glucose-Capillary: 85 mg/dL (ref 70–99)
Glucose-Capillary: 94 mg/dL (ref 70–99)
Glucose-Capillary: 97 mg/dL (ref 70–99)

## 2020-01-10 LAB — URINALYSIS, ROUTINE W REFLEX MICROSCOPIC
Bilirubin Urine: NEGATIVE
Glucose, UA: NEGATIVE mg/dL
Hgb urine dipstick: NEGATIVE
Ketones, ur: NEGATIVE mg/dL
Nitrite: NEGATIVE
Protein, ur: NEGATIVE mg/dL
Specific Gravity, Urine: 1.012 (ref 1.005–1.030)
pH: 5 (ref 5.0–8.0)

## 2020-01-10 LAB — PHOSPHORUS: Phosphorus: 4.6 mg/dL (ref 2.5–4.6)

## 2020-01-10 LAB — SODIUM, URINE, RANDOM: Sodium, Ur: 66 mmol/L

## 2020-01-10 LAB — CREATININE, URINE, RANDOM: Creatinine, Urine: 38.97 mg/dL

## 2020-01-10 LAB — MAGNESIUM: Magnesium: 1.8 mg/dL (ref 1.7–2.4)

## 2020-01-10 MED ORDER — TRAVASOL 10 % IV SOLN
INTRAVENOUS | Status: AC
  Filled 2020-01-10: qty 998.4

## 2020-01-10 NOTE — Progress Notes (Signed)
PROGRESS NOTE   Caitlin Peters  SPQ:[id number]    DOB: [birth year]    DOA: 01/02/2020  PCP: [name], [name], MD   I have briefly reviewed patients previous medical records in [facility].  Chief Complaint:   Chief Complaint  Patient presents with  . Abdominal Pain    Brief Narrative:  59 year old female with PMH of Crohn's disease, s/p short gut syndrome (multiple previous surgeries, SBO), home TPN dependent (since 2003) via left femoral tunneled CVC, recurrent bacteremia, HTN, chronic anemia requiring PRBC transfusions, chronic pain, depression, osteoporosis, GERD presented to ED on 2/6 with diarrhea consistent with her Crohn's disease, fever.  In ED febrile to 101 F.  CT showed biliary dilatation postcholecystectomy and possible colitis.  Found to be bacteremic.  ID, GI and IR consulted.  S/p exchange of tunneled left CFV cuffed central venous catheter by IR on 2/11.  Hospital course complicated by acute on chronic kidney disease.  Nephrology consulted on 2/14.   Assessment & Plan:  Principal Problem:   Crohn's colitis ([place]) Active Problems:   Fever   Short gut syndrome   HTN (hypertension)   GERD (gastroesophageal reflux disease)   Chronic pain syndrome   Acute kidney injury superimposed on chronic kidney disease ([name])   On total parenteral nutrition (TPN)   Elevated liver enzymes   Gram-negative bacteremia   Infection of peripherally inserted central venous catheter (PICC)   Recurrent Enterobacter bacteremia: Suspected due to line infection (did not meet sepsis criteria) versus less likely from GI source.  Left femoral CVC placed in 08/2019 in [place].  Reportedly difficult vascular access and hence femoral placed at that time.  Cycles TPN at home.  ID consulted, recommend continuing ertapenem, surveillance blood cultures from 2/8: Negative, final report.  S/p exchange of tunneled left CFV central venous catheter by IR on 2/11.  ID recommends continued IV  ertapenem per OPAT protocol through 01/21/2020 and has follow-up appointment with ID on 01/27/2020 at 10:15 AM.  Crohn's disease, S/p multiple bowel surgery/short gut syndrome on chronic TPN, chronic diarrhea:.  CT abdomen 2/6 suggested possible colitis, no SBO or pneumoperitoneum.  Mildly elevated LFTs have almost normalized.  Acute hepatitis panel negative. C. difficile and GI panel PCR negative. Currently on Entocort and IV ertapenem.  On 2/11 reported worsening abdominal pain, evaluated by [facility], indicate that this is a manifestation of the patient's waxing and waning chronic abdominal pain related to her complex Crohn's disease and not a new process, no further testing planned and recommend outpatient follow-up with her pain MD for management.  Patient reports much improved pain control after starting home dose of methadone.  Stable.  Abnormal LFTs: [facility] input 2/9 appreciated.  Extensively evaluated during recent admission for same.  Now with recurrent elevated LFTs with enzymes in the 400s.  MRCP showed mild intra-/extrahepatic biliary ductal dilatation to the level of ampulla and diffusely dilated pancreatic duct.  GI indicates that this may be due to a passed stone which may explain her abnormal liver tests and bacteremia.  No ERCP planned.  Considering EUS at some point.  Enzymes have almost normalized.  Acute kidney injury complicating stage III chronic kidney disease: Creatinine has improved from 1.5-1.28, likely her baseline.  Creatinine progressively worsened despite IV fluid hydration, up to 1.7 on 2/14.  Nephrology consultation appreciated.  Suspect hemodynamically mediated, possibly with mild ATN and FENa 2%.  Checking repeat urine microscopy, no concerning medications on list.  Avoid nephrotoxins.  Clinically euvolemic or hypervolemic.  Follow BMP in a.m.  Anemia of chronic disease: Hemoglobin stable in the 8 g range.  Pancytopenia resolved.  Follow CBC  periodically.  Thrombocytopenia: Resolved today.  Essential hypertension: Currently on amlodipine 5 mg daily and metoprolol 100 mg twice daily.  Acceptable control.  Severe protein calorie malnutrition: On chronic TPN, continue.  Discussed with pharmacy.  Chronic pain syndrome: Judicious use of opioids.  Patient reports that she follows with Dr. Royce Macadamia, pain management.  Continue home dose of methadone.  Discussed with patient regarding minimizing IV Dilaudid use, prefers to continue as needed IV Dilaudid while hospitalized due to pain issues prior to resuming home oral medications at discharge.  Right lower lobe 6 mm pulmonary nodule: Noted on CT on admission.  Outpatient follow-up as per radiology recommendations.  Body mass index is 20.16 kg/m.  Nutritional Status Nutrition Problem: Increased nutrient needs Etiology: chronic illness(short gut sundrome secondary to crohn's disease) Signs/Symptoms: estimated needs Interventions: Boost Breeze, Refer to RD note for recommendations  DVT prophylaxis: Lovenox Code Status: Full Family Communication: None at bedside.  I discussed in detail with patient's daughter via phone 2/12, updated care and answered questions. Disposition:  . Patient came from: Home           . Anticipated d/c place: Home . Barriers to d/c: Worsening acute kidney injury.   Consultants:   ID [facility] IR  Procedures:   Ongoing TPN via left femoral tunneled central venous catheter. S/p exchange of tunneled left CFV cuffed central venous catheter by IR on 2/11.  Antimicrobials:   Zosyn 2/6-2/9 Ertapenem 2/9 >   Subjective:  No new complaints.  No pain reported.  Indicates that she has had issues with acute kidney injury during previous hospital admissions necessitating nephrology consultation.  Does not follow with nephrology as outpatient.  Objective:   Vitals:   01/09/20 1636 01/09/20 1957 01/10/20 0410 01/10/20 1031  BP: (!) 126/56 140/73 (!) 118/58  (!) 150/71  Pulse: 93 86 76 64  Resp: 17 18 18    Temp: 98.3 F (36.8 C) 98.8 F (37.1 C) 99.2 F (37.3 C)   TempSrc: Oral Oral Oral   SpO2: 98% 100% 100%   Weight:      Height:       Patient examined along with female nurse tech in room.  General exam: Pleasant middle-aged female, small built, frail, chronically ill looking sitting up comfortably in bed without distress. Respiratory system: Clear to auscultation.  No increased work of breathing. Cardiovascular system: S1 and S2 heard, RRR.  No JVD, murmurs. Gastrointestinal system: Abdomen is nondistended, soft and nontender.  Normal bowel sounds heard. Central nervous system: Alert and oriented. No focal neurological deficits. Extremities: Symmetric 5 x 5 power.  New left femoral central line site without acute findings.  No edema of extremities. Skin: No rashes, lesions or ulcers Psychiatry: Judgement and insight appear normal. Mood & affect pleasant and appropriate    Data Reviewed:   I have personally reviewed following labs and imaging studies   CBC: Recent Labs  Lab 01/04/20 0341 01/06/20 0229 01/08/20 0447  WBC 6.1 3.4* 4.8  NEUTROABS 4.4  --   --   HGB 7.5* 8.0* 8.1*  HCT 24.2* 24.8* 24.5*  MCV 96.0 91.5 91.8  PLT 144* 140* 481    Basic Metabolic Panel: Recent Labs  Lab 01/06/20 0229 01/06/20 0229 01/07/20 0516 01/07/20 0516 01/08/20 0447 01/09/20 0401 01/10/20 0330  NA 138   < > 141   < >  138 139 139  K 4.0   < > 4.3   < > 4.4 4.8 4.2  CL 110   < > 106   < > 111 113* 114*  CO2 22   < > 22   < > 21* 20* 19*  GLUCOSE 122*   < > 125*   < > 120* 97 90  BUN 9   < > 14   < > 26* 31* 36*  CREATININE 1.28*   < > 1.22*   < > 1.43* 1.56* 1.71*  CALCIUM 8.8*   < > 9.7   < > 9.2 9.2 9.0  MG 1.6*  --  1.9  --   --   --  1.8  PHOS 2.6  --  3.0  --   --   --  4.6   < > = values in this interval not displayed.    Liver Function Tests: Recent Labs  Lab 01/05/20 0500 01/07/20 0516 01/08/20 0447  AST 27  22 18   ALT 119* 76* 53*  ALKPHOS 232* 251* 233*  BILITOT 1.1 0.6 0.4  PROT 6.2* 7.0 6.2*  ALBUMIN 2.3* 2.8* 2.4*    CBG: Recent Labs  Lab 01/10/20 0407 01/10/20 0726 01/10/20 1229  GLUCAP 85 84 81    Microbiology Studies:   Recent Results (from the past 240 hour(s))  Blood culture (routine x 2)     Status: Abnormal   Collection Time: 01/02/20  5:30 AM   Specimen: BLOOD  Result Value Ref Range Status   Specimen Description BLOOD RIGHT ANTECUBITAL  Final   Special Requests   Final    BOTTLES DRAWN AEROBIC AND ANAEROBIC Blood Culture results may not be optimal due to an inadequate volume of blood received in culture bottles   Culture  Setup Time   Final    GRAM NEGATIVE RODS IN BOTH AEROBIC AND ANAEROBIC BOTTLES CRITICAL RESULT CALLED TO, READ BACK BY AND VERIFIED WITH: [place] C. [phone] FCP Performed at [facility], [place] [address], [place], [place] [address]    Culture ENTEROBACTER CLOACAE (A)  Final   Report Status 01/05/2020 FINAL  Final   Organism ID, Bacteria ENTEROBACTER CLOACAE  Final      Susceptibility   Enterobacter cloacae - MIC*    CEFAZOLIN >=64 RESISTANT Resistant     CEFEPIME <=0.12 SENSITIVE Sensitive     CEFTAZIDIME <=1 SENSITIVE Sensitive     CIPROFLOXACIN <=0.25 SENSITIVE Sensitive     GENTAMICIN <=1 SENSITIVE Sensitive     IMIPENEM <=0.25 SENSITIVE Sensitive     TRIMETH/SULFA <=20 SENSITIVE Sensitive     PIP/TAZO 8 SENSITIVE Sensitive     * ENTEROBACTER CLOACAE  Blood culture (routine x 2)     Status: Abnormal   Collection Time: 01/02/20  5:30 AM   Specimen: BLOOD  Result Value Ref Range Status   Specimen Description BLOOD LEFT ANTECUBITAL  Final   Special Requests   Final    BOTTLES DRAWN AEROBIC AND ANAEROBIC Blood Culture adequate volume   Culture  Setup Time   Final    GRAM NEGATIVE RODS IN BOTH AEROBIC AND ANAEROBIC BOTTLES    Culture (A)  Final    ENTEROBACTER CLOACAE SUSCEPTIBILITIES PERFORMED ON PREVIOUS CULTURE  WITHIN THE LAST 5 DAYS. Performed at [facility], [place] [address] [place], [place] [address]    Report Status 01/05/2020 FINAL  Final  Blood Culture ID Panel (Reflexed)     Status: Abnormal  Collection Time: 01/02/20  5:30 AM  Result Value Ref Range Status   Enterococcus species NOT DETECTED NOT DETECTED Final   Listeria monocytogenes NOT DETECTED NOT DETECTED Final   Staphylococcus species NOT DETECTED NOT DETECTED Final   Staphylococcus aureus (BCID) NOT DETECTED NOT DETECTED Final   Streptococcus species NOT DETECTED NOT DETECTED Final   Streptococcus agalactiae NOT DETECTED NOT DETECTED Final   Streptococcus pneumoniae NOT DETECTED NOT DETECTED Final   Streptococcus pyogenes NOT DETECTED NOT DETECTED Final   Acinetobacter baumannii NOT DETECTED NOT DETECTED Final   Enterobacteriaceae species DETECTED (A) NOT DETECTED Final    Comment: Enterobacteriaceae represent a large family of gram-negative bacteria, not a single organism. CRITICAL RESULT CALLED TO, READ BACK BY AND VERIFIED WITH: PHARMD L[place] C. [phone] FCP    Enterobacter cloacae complex DETECTED (A) NOT DETECTED Final    Comment: CRITICAL RESULT CALLED TO, READ BACK BY AND VERIFIED WITH: PHARMD LYDIA C. [phone] FCP    Escherichia coli NOT DETECTED NOT DETECTED Final   Klebsiella oxytoca NOT DETECTED NOT DETECTED Final   Klebsiella pneumoniae NOT DETECTED NOT DETECTED Final   Proteus species NOT DETECTED NOT DETECTED Final   Serratia marcescens NOT DETECTED NOT DETECTED Final   Carbapenem resistance NOT DETECTED NOT DETECTED Final   Haemophilus influenzae NOT DETECTED NOT DETECTED Final   Neisseria meningitidis NOT DETECTED NOT DETECTED Final   Pseudomonas aeruginosa NOT DETECTED NOT DETECTED Final   Candida albicans NOT DETECTED NOT DETECTED Final   Candida glabrata NOT DETECTED NOT DETECTED Final   Candida krusei NOT DETECTED NOT DETECTED Final   Candida parapsilosis NOT DETECTED NOT DETECTED Final    Candida tropicalis NOT DETECTED NOT DETECTED Final    Comment: Performed at [facility], [place] [address], [place], [place] [address]  Novel Coronavirus, NAA (Hosp order, Send-out to Ref Lab; TAT 18-24 hrs     Status: None   Collection Time: 01/02/20  7:48 AM   Specimen: Nasopharyngeal Swab; Respiratory  Result Value Ref Range Status   SARS-CoV-2, NAA NOT DETECTED NOT DETECTED Final    Comment: (NOTE) This nucleic acid amplification test was developed and its performance characteristics determined by [employer]. Nucleic acid amplification tests include RT-PCR and TMA. This test has not been FDA cleared or approved. This test has been authorized by FDA under an Emergency Use Authorization (EUA). This test is only authorized for the duration of time the declaration that circumstances exist justifying the authorization of the emergency use of in vitro diagnostic tests for detection of SARS-CoV-2 virus and/or diagnosis of COVID-19 infection under section 564(b)(1) of the Act, 21 U.S.C. 893YBO-1(B) (1), unless the authorization is terminated or revoked sooner. When diagnostic testing is negative, the possibility of a false negative result should be considered in the context of a patient's recent exposures and the presence of clinical signs and symptoms consistent with COVID-19. An individual without symptoms of COVID- 19 and who is not shedding SARS-CoV-2  virus would expect to have a negative (not detected) result in this assay. Performed At: [facility] [address] [place], [place] [id number] Rush Farmer MD PO:[id number]    [place]  Final    Comment: Performed at [facility], [place] [address] [place], [place] [address]  C difficile quick scan w PCR reflex     Status: None   Collection Time: 01/03/20 12:11 AM   Specimen: STOOL  Result Value Ref Range Status   C Diff  antigen NEGATIVE NEGATIVE Final   C Diff toxin NEGATIVE  NEGATIVE Final   C Diff interpretation No C. difficile detected.  Final    Comment: Performed at [facility], [place] [address], [place], [place] [address]  GI pathogen panel by PCR, stool     Status: None   Collection Time: 01/03/20 12:11 AM   Specimen: STOOL  Result Value Ref Range Status   Plesiomonas shigelloides NOT DETECTED NOT DETECTED Final   Yersinia enterocolitica NOT DETECTED NOT DETECTED Final   Vibrio NOT DETECTED NOT DETECTED Final   Enteropathogenic E coli NOT DETECTED NOT DETECTED Final   E coli (ETEC) LT/ST NOT DETECTED NOT DETECTED Final   E coli 7793 by PCR Not applicable NOT DETECTED Final   Cryptosporidium by PCR NOT DETECTED NOT DETECTED Final   Entamoeba histolytica NOT DETECTED NOT DETECTED Final   Adenovirus F 40/41 NOT DETECTED NOT DETECTED Final   Norovirus GI/GII NOT DETECTED NOT DETECTED Final   Sapovirus NOT DETECTED NOT DETECTED Final    Comment: (NOTE) Performed At: [facility] [place], [place] [id number] Rush Farmer MD AQ:[id number]    Vibrio cholerae NOT DETECTED NOT DETECTED Final   Campylobacter by PCR NOT DETECTED NOT DETECTED Final   Salmonella by PCR NOT DETECTED NOT DETECTED Final   E coli (STEC) NOT DETECTED NOT DETECTED Final   Enteroaggregative E coli NOT DETECTED NOT DETECTED Final   Shigella by PCR NOT DETECTED NOT DETECTED Final   Cyclospora cayetanensis NOT DETECTED NOT DETECTED Final   Astrovirus NOT DETECTED NOT DETECTED Final   G lamblia by PCR NOT DETECTED NOT DETECTED Final   Rotavirus A by PCR NOT DETECTED NOT DETECTED Final  Culture, blood (Routine X 2) w Reflex to ID Panel     Status: None   Collection Time: 01/04/20  6:47 AM   Specimen: BLOOD RIGHT HAND  Result Value Ref Range Status   Specimen Description BLOOD RIGHT HAND  Final   Special Requests   Final    BOTTLES DRAWN AEROBIC ONLY Blood Culture results may not be optimal due to an inadequate volume of blood received in culture bottles    Culture   Final    NO GROWTH 5 DAYS Performed at [facility], [address], [place], [place] [address]    Report Status 01/09/2020 FINAL  Final     Radiology Studies:  No results found.   Scheduled Meds:   . acidophilus  1 capsule Oral Daily  . amLODipine  5 mg Oral Daily  . budesonide  9 mg Oral Daily  . buPROPion  200 mg Oral Daily  . Chlorhexidine Gluconate Cloth  6 each Topical Daily  . cycloSPORINE  1 drop Both Eyes BID  . dicyclomine  20 mg Oral TID  . DULoxetine  90 mg Oral Daily  . enoxaparin (LOVENOX) injection  40 mg Subcutaneous Daily  . estradiol  2 mg Oral Daily  . famotidine  20 mg Oral QHS  . feeding supplement  1 Container Oral TID BM  . hydroxypropyl methylcellulose / hypromellose  1 drop Both Eyes QID  . lipase/protease/amylase  108,000 Units Oral TID AC  . methadone  10 mg Oral Q8H  . metoprolol tartrate  100 mg Oral BID  . pantoprazole  40 mg Oral Daily  . sodium chloride flush  3 mL Intravenous Q12H  . sucralfate  1 g Oral TID WC & HS    Continuous Infusions:   .  ertapenem 1,000 mg (01/10/20 1332)  . TPN CYCLIC-ADULT (ION) 51 mL/hr at 01/10/20 1237  . TPN CYCLIC-ADULT (ION)       LOS: 8 days     Vernell Leep, MD, [place], [facility]    To contact the attending provider between 7A-7P or the covering provider during after hours 7P-7A, please log into the web site [email or web address] and access using universal [place] password for that web site. If you do not have the password, please call the hospital operator.  01/10/2020, 1:45 PM

## 2020-01-10 NOTE — Progress Notes (Addendum)
PHARMACY - TOTAL PARENTERAL NUTRITION CONSULT NOTE   Indication: Short bowel syndrome  Patient Measurements: Height: 5' 8"  (172.7 cm) Weight: 132 lb 9.6 oz (60.1 kg) IBW/kg (Calculated) : 63.9 TPN AdjBW (KG): 62.4 Body mass index is 20.16 kg/m.  Assessment:  71 yof with history of complex Crohn's disease on TPN PTA for short gut syndrome presenting 01/02/20 with abdominal pain and diarrhea. Patient reportedly has access issues and per H&P, had her femoral PICC changed 2 days PTA.   Patient has recurrent bacteremia with retained line. BCx from 01/02/20 are growing enterobacter in 4 of 4 bottles. ID and GI are following. Patient may need line replacement but difficult case with previous access issues. GI started CLD.  Glucose / Insulin: 96 - 101 Electrolytes: Na 139, K 4.2, Mag 1.8 and Phos 4.6  Renal: Scr 1.43>1.56>1.71 (worsening - nephrology consulted); BUN 26>31>36 LFTs / TGs: ALT trended down; TGs 214 Prealbumin / albumin: pre 18.5/ alb 2.4 Intake / Output; MIVF: 5 unmeasured urine occurances, 2 stools, MIVF off GI Imaging: Surgeries / Procedures: 2/11 PICC exchange  Central access: PICC 11/16 - line exchanged 2/11 TPN start date: TPN 2/9 (also on it at home)   Nutritional Goals (per RD recommendation on 2/8): Kcal:  1950-2150 Protein:  95-110 grams Fluid:  > 1.9 L  Patient was on home TPN which was proving 1780 kcals and 100 gms of protein per day in 1800 mls.  It was being cycled over 21 hours per day.  Will cycle TPN over 20 hours. 51 ml/hr x 1 hr then 101 ml/hr x 18 hr then 51 ml/hr x 1 hr (provides 100 g of protein and 2060 kcals per day)  Current Nutrition:  CLD  Plan:  Will cycle TPN over 20 hours (close to home regimen). 51 ml/hr x 1 hr then 101 ml/hr x 18 hr then 51 ml/hr x 1 hr (provides 100 g of protein and 2060 kcals per day) Electrolytes in TPN: continue 359mq/L of Na, 5759m/L of K, 59m53mL of Ca, 7mE68m of Mg, and 159mm56m of Phos. Cl:Ac ratio 1:1 Add standard  MVI on MWF due to national shortage and trace elements to TPN Will d/c FSBS Monitor TPN labs closely with worsening renal function F/u nephrology consult   Caitlin Peters, FCCM [facility] Hospital[facility] Pharmacist Please see AMION for all Pharmacists' Contact Phone Numbers 01/10/2020, 9:15 AM   ADDENDUM: TPN rate was never turned up for the previous 24 hour TPN.  So, patient only got 51 ml/hr x 20 hours on 2/13-2/14.  Caitlin Peters, FCCM [facility] Please see AMION for all Pharmacists' Contact Phone Numbers 01/10/2020, 2:33 PM

## 2020-01-10 NOTE — Consult Note (Signed)
Reason for Consult: Acute kidney injury on chronic kidney disease stage III Referring Physician: Vernell Leep, MD [facility])  HPI:  59 year old African-American woman with past medical history significant for Crohn disease status post multiple abdominal surgeries and short gut syndrome, malnutrition on chronic TPN via left femoral tunneled line, hypertension, chronic anemia requiring blood transfusion, chronic abdominal pain, GERD, osteoporosis and what appears to be baseline chronic kidney disease stage III with stable state creatinine ranging 1.2-1.5 based on records available.  She was admitted to the hospital 8 days ago with diarrhea and fever with work-up showing colitis with Enterobacter bacteremia treated with ertapenem and exchange of tunneled catheter.  Unfortunately, urine output is not accurately charted although patient reports "putting out a lot of urine".  Concern raised with rising creatinine in spite of intravenous fluids.  The patient reports prior history of acute kidney injury that was hemodynamically mediated and responded to fluids during prior admissions and recent problems with anasarca when she temporarily stopped her TPN with consequent malnutrition/hypoalbuminemia.  She denies any NSAID use and does not have any hematuria.  Urinalysis on admission was negative for RBCs and showed trace proteinuria.  Fractional excretion of sodium is 2%.  She had a CT scan of the abdomen and pelvis 8 days ago with iodinated contrast.    01/02/2020  01/04/2020  01/07/2020  01/08/2020  01/09/2020  01/10/2020   BUN 46 (H) 26 (H) 14 26 (H) 31 (H) 36 (H)  Creatinine 1.63 (H) 1.59 (H) 1.22 (H) 1.43 (H) 1.56 (H) 1.71 (H)    Past Medical History:  Diagnosis Date  . Anasarca 10/2019  . AVN (avascular necrosis of bone) ([place])   . Cataract   . Chronic pain syndrome   . CKD (chronic kidney disease), stage III   . Crohn disease ([place])   . Crohn disease ([place])   . Depression   . Diverticulosis   . GERD  (gastroesophageal reflux disease)   . HTN (hypertension)   . IDA (iron deficiency anemia)   . Malnutrition ([place])   . Mass in chest   . Osteoporosis   . Pancreatitis   . Short gut syndrome   . Vitamin B12 deficiency     Past Surgical History:  Procedure Laterality Date  . ABDOMINAL HYSTERECTOMY    . ABDOMINAL SURGERY     colon resection with abdominal stoma, repair of intestinal leak, reversal of abdominal stoma  . BILIARY DILATION  11/26/2019   Procedure: BILIARY DILATION;  Surgeon: Jackquline Denmark, MD;  Location: [facility];  Service: Endoscopy;;  . CHEST WALL RESECTION     right thoracotomy,resection of chest mass with anterior rib and reconstruction using prosthetic mesh and video arthroscopy  . CHOLECYSTECTOMY    . COLONOSCOPY  2019  . ERCP N/A 11/26/2019   Procedure: ENDOSCOPIC RETROGRADE CHOLANGIOPANCREATOGRAPHY (ERCP);  Surgeon: Jackquline Denmark, MD;  Location: [facility];  Service: Endoscopy;  Laterality: N/A;  . IR FLUORO GUIDE CV LINE LEFT  01/07/2020  . IR PTA VENOUS EXCEPT DIALYSIS CIRCUIT  01/07/2020  . IR US GUIDE VASC ACCESS LEFT     x 2 06/17/19 and 09/14/2019  . KNEE SURGERY     right knee   . REMOVAL OF STONES  11/26/2019   Procedure: REMOVAL OF STONES;  Surgeon: Jackquline Denmark, MD;  Location: [facility];  Service: Endoscopy;;  . SMALL INTESTINE SURGERY     x3  . SPHINCTEROTOMY  11/26/2019   Procedure: SPHINCTEROTOMY;  Surgeon: Jackquline Denmark, MD;  Location: [facility];  Service: Endoscopy;;  . UPPER GASTROINTESTINAL ENDOSCOPY      Family History  Problem Relation Age of Onset  . Breast cancer Sister   . Multiple sclerosis Sister   . Diabetes Sister   . Lupus Sister   . Colon cancer Other   . Crohn's disease Other   . Seizures Mother   . Glaucoma Mother   . CAD Father   . Heart disease Father   . Hypertension Father     Social History:  reports that she has quit smoking. She has never used smokeless tobacco. She reports previous alcohol use. She  reports that she does not use drugs.  Allergies:  Allergies  Allergen Reactions  . Cefepime Other (See Comments)    Neurotoxicity occurring in setting of AKI. Ceftriaxone tolerated during same admit  . Gabapentin Other (See Comments)    unknown  . Hyoscyamine Hives and Swelling    Legs swelling   Disorientation  . Lyrica [Pregabalin] Other (See Comments)    unknown  . Meperidine Hives    Other reaction(s): GI Upset Due to [place]   . Topamax [Topiramate] Other (See Comments)    unknown  . Fentanyl Rash    Pt is allergic to fentanyl patch related to the glue (gives her a rash) Pt states she is NOT allergic to fentanyl IV medicine  . Morphine And Related Rash    Medications:  Scheduled: . acidophilus  1 capsule Oral Daily  . amLODipine  5 mg Oral Daily  . budesonide  9 mg Oral Daily  . buPROPion  200 mg Oral Daily  . Chlorhexidine Gluconate Cloth  6 each Topical Daily  . cycloSPORINE  1 drop Both Eyes BID  . dicyclomine  20 mg Oral TID  . DULoxetine  90 mg Oral Daily  . enoxaparin (LOVENOX) injection  40 mg Subcutaneous Daily  . estradiol  2 mg Oral Daily  . famotidine  20 mg Oral QHS  . feeding supplement  1 Container Oral TID BM  . hydroxypropyl methylcellulose / hypromellose  1 drop Both Eyes QID  . lipase/protease/amylase  108,000 Units Oral TID AC  . methadone  10 mg Oral Q8H  . metoprolol tartrate  100 mg Oral BID  . pantoprazole  40 mg Oral Daily  . sodium chloride flush  3 mL Intravenous Q12H  . sucralfate  1 g Oral TID WC & HS    BMP Latest Ref Rng & Units 01/10/2020 01/09/2020 01/08/2020  Glucose 70 - 99 mg/dL 90 97 120(H)  BUN 6 - 20 mg/dL 36(H) 31(H) 26(H)  Creatinine 0.44 - 1.00 mg/dL 1.71(H) 1.56(H) 1.43(H)  Sodium 135 - 145 mmol/L 139 139 138  Potassium 3.5 - 5.1 mmol/L 4.2 4.8 4.4  Chloride 98 - 111 mmol/L 114(H) 113(H) 111  CO2 22 - 32 mmol/L 19(L) 20(L) 21(L)  Calcium 8.9 - 10.3 mg/dL 9.0 9.2 9.2   CBC Latest Ref Rng & Units 01/08/2020  01/06/2020 01/04/2020  WBC 4.0 - 10.5 K/uL 4.8 3.4(L) 6.1  Hemoglobin 12.0 - 15.0 g/dL 8.1(L) 8.0(L) 7.5(L)  Hematocrit 36.0 - 46.0 % 24.5(L) 24.8(L) 24.2(L)  Platelets 150 - 400 K/uL 177 140(L) 144(L)     No results found.  Review of Systems  Constitutional: Positive for fatigue. Negative for chills and fever.  HENT: Negative for congestion, sore throat and trouble swallowing.   Eyes: Negative.   Cardiovascular: Negative.   Gastrointestinal: Positive for abdominal pain. Negative for blood in stool, diarrhea, nausea and vomiting.  Endocrine: Negative for polyuria.  Genitourinary: Negative for dysuria, frequency and hematuria.  Musculoskeletal: Negative for back pain, joint swelling and myalgias.  Skin: Negative.   Neurological: Positive for weakness. Negative for dizziness and light-headedness.   Blood pressure (!) 150/71, pulse 64, temperature 99.2 F (37.3 C), temperature source Oral, resp. rate 18, height 5' 8"  (1.727 m), weight 60.1 kg, SpO2 100 %. Physical Exam  Nursing note and vitals reviewed. Constitutional: She is oriented to person, place, and time. She appears well-developed and well-nourished. No distress.  HENT:  Head: Normocephalic and atraumatic.  Mouth/Throat: Oropharynx is clear and moist.  Eyes: Pupils are equal, round, and reactive to light. Conjunctivae and EOM are normal. No scleral icterus.  Neck: No JVD present.  Cardiovascular: Normal rate, regular rhythm and normal heart sounds.  No murmur heard. Respiratory: Effort normal and breath sounds normal. She has no wheezes. She has no rales.  GI: Soft. Bowel sounds are normal. There is abdominal tenderness. There is no rebound and no guarding.  Multiple abdominal wall scars from previous surgeries  Musculoskeletal:        General: Edema present.     Cervical back: Normal range of motion and neck supple.     Comments: Trace ankle edema.  Left femoral cuffed CVC in situ connected to TPN.  Neurological: She is  alert and oriented to person, place, and time.  Skin: Skin is warm and dry. No rash noted.  Psychiatric:  With childlike affect    Assessment/Plan: 1.  Acute kidney injury on chronic kidney disease stage III: Suspect that this is hemodynamically mediated possibly with mild ATN given lack of response to intravenous fluids as well as current fractional excretion of sodium.  I will repeat a urinalysis to look for any hematuria or significant proteinuria and obtain a renal ultrasound to look for any developing obstruction as well as assess renal echogenicity.  Clinically, she is euvolemic to possibly slightly hypervolemic and does not have any acute electrolyte abnormalities.  Medications reviewed to verify renal dosing.  Not on any obvious nephrotoxins.  We will continue to follow daily labs to decide on additional management. 2.  Recurrent Enterobacter bacteremia: Suspected to be from line infection with ongoing TPN.  On antibiotic coverage with ertapenem and status post exchange of left femoral CVC. 3.  Crohn's disease status post multiple surgeries with consequent shortcut syndrome: On chronic TPN, patient reports that she would like to see a nutritionist at some point. 4.  Anemia of chronic disease: Associated with her chronic malnutrition/malabsorption.  PRBC transfusion per primary service.  [name] K. 01/10/2020, 12:22 PM

## 2020-01-11 ENCOUNTER — Encounter (HOSPITAL_COMMUNITY): Payer: Self-pay | Admitting: Internal Medicine

## 2020-01-11 LAB — PREALBUMIN: Prealbumin: 25.4 mg/dL (ref 18–38)

## 2020-01-11 LAB — CBC
HCT: 23.3 % — ABNORMAL LOW (ref 36.0–46.0)
HCT: 24.5 % — ABNORMAL LOW (ref 36.0–46.0)
Hemoglobin: 7.2 g/dL — ABNORMAL LOW (ref 12.0–15.0)
Hemoglobin: 7.7 g/dL — ABNORMAL LOW (ref 12.0–15.0)
MCH: 29.8 pg (ref 26.0–34.0)
MCH: 32 pg (ref 26.0–34.0)
MCHC: 30.9 g/dL (ref 30.0–36.0)
MCHC: 31.4 g/dL (ref 30.0–36.0)
MCV: 96.3 fL (ref 80.0–100.0)
MCV: 101.7 fL — ABNORMAL HIGH (ref 80.0–100.0)
Platelets: 238 10*3/uL (ref 150–400)
Platelets: 236 10*3/uL (ref 150–400)
RBC: 2.42 MIL/uL — ABNORMAL LOW (ref 3.87–5.11)
RBC: 2.41 MIL/uL — ABNORMAL LOW (ref 3.87–5.11)
RDW: 13.9 % (ref 11.5–15.5)
RDW: 13.9 % (ref 11.5–15.5)
WBC: 3.6 10*3/uL — ABNORMAL LOW (ref 4.0–10.5)
WBC: 3.9 10*3/uL — ABNORMAL LOW (ref 4.0–10.5)
nRBC: 0 % (ref 0.0–0.2)
nRBC: 0 % (ref 0.0–0.2)

## 2020-01-11 LAB — DIFFERENTIAL
Abs Immature Granulocytes: 0.02 10*3/uL (ref 0.00–0.07)
Basophils Absolute: 0 10*3/uL (ref 0.0–0.1)
Basophils Relative: 0 %
Eosinophils Absolute: 0.1 10*3/uL (ref 0.0–0.5)
Eosinophils Relative: 3 %
Immature Granulocytes: 1 %
Lymphocytes Relative: 30 %
Lymphs Abs: 1.1 10*3/uL (ref 0.7–4.0)
Monocytes Absolute: 0.2 10*3/uL (ref 0.1–1.0)
Monocytes Relative: 6 %
Neutro Abs: 2.2 10*3/uL (ref 1.7–7.7)
Neutrophils Relative %: 60 %

## 2020-01-11 LAB — COMPREHENSIVE METABOLIC PANEL
ALT: 77 U/L — ABNORMAL HIGH (ref 0–44)
AST: 50 U/L — ABNORMAL HIGH (ref 15–41)
Albumin: 2.5 g/dL — ABNORMAL LOW (ref 3.5–5.0)
Alkaline Phosphatase: 170 U/L — ABNORMAL HIGH (ref 38–126)
Anion gap: 7 (ref 5–15)
BUN: 34 mg/dL — ABNORMAL HIGH (ref 6–20)
CO2: 18 mmol/L — ABNORMAL LOW (ref 22–32)
Calcium: 9.1 mg/dL (ref 8.9–10.3)
Chloride: 114 mmol/L — ABNORMAL HIGH (ref 98–111)
Creatinine, Ser: 1.58 mg/dL — ABNORMAL HIGH (ref 0.44–1.00)
GFR calc Af Amer: 41 mL/min — ABNORMAL LOW (ref >60)
GFR calc non Af Amer: 36 mL/min — ABNORMAL LOW (ref >60)
Glucose, Bld: 99 mg/dL (ref 70–99)
Potassium: 4.3 mmol/L (ref 3.5–5.1)
Sodium: 139 mmol/L (ref 135–145)
Total Bilirubin: 0.5 mg/dL (ref 0.3–1.2)
Total Protein: 6 g/dL — ABNORMAL LOW (ref 6.5–8.1)

## 2020-01-11 LAB — GLUCOSE, CAPILLARY
Glucose-Capillary: 93 mg/dL (ref 70–99)
Glucose-Capillary: 101 mg/dL — ABNORMAL HIGH (ref 70–99)
Glucose-Capillary: 98 mg/dL (ref 70–99)
Glucose-Capillary: 89 mg/dL (ref 70–99)

## 2020-01-11 LAB — MAGNESIUM: Magnesium: 1.9 mg/dL (ref 1.7–2.4)

## 2020-01-11 LAB — PHOSPHORUS: Phosphorus: 4.6 mg/dL (ref 2.5–4.6)

## 2020-01-11 LAB — TRIGLYCERIDES: Triglycerides: 56 mg/dL (ref <150)

## 2020-01-11 MED ORDER — ERTAPENEM IV (FOR PTA / DISCHARGE USE ONLY): 1.0000 g | INTRAVENOUS | 0 refills | Status: AC

## 2020-01-11 MED ORDER — TRAVASOL 10 % IV SOLN
INTRAVENOUS | Status: DC
  Filled 2020-01-11: qty 998.4

## 2020-01-11 MED ORDER — HEPARIN SOD (PORK) LOCK FLUSH 100 UNIT/ML IV SOLN
250.0000 [IU] | INTRAVENOUS | Status: DC | PRN
  Filled 2020-01-11: qty 2.5

## 2020-01-11 MED ORDER — METHADONE HCL 10 MG PO TABS: 10.0000 mg | ORAL_TABLET | Freq: Three times a day (TID) | ORAL | Status: DC

## 2020-01-11 NOTE — Progress Notes (Signed)
PHARMACY - TOTAL PARENTERAL NUTRITION CONSULT NOTE   Indication: Short bowel syndrome  Patient Measurements: Height: 5' 8"  (172.7 cm) Weight: 132 lb 9.6 oz (60.1 kg) IBW/kg (Calculated) : 63.9 TPN AdjBW (KG): 62.4 Body mass index is 20.16 kg/m.  Assessment:  66 yof with history of complex Crohn's disease on TPN PTA for short gut syndrome presenting 01/02/20 with abdominal pain and diarrhea. Patient reportedly has access issues and per H&P, had her femoral PICC changed 2 days PTA.   Patient has recurrent bacteremia with retained line. BCx from 01/02/20 are growing enterobacter in 4 of 4 bottles. ID and GI are following. Patient had line replaced this admission.   Glucose / Insulin: 90s (on half rate TPN; DC'd FSBS), no insulin Electrolytes: Chloride 114, CO2 18, Phos 4.6, Mg 1.9 Renal: Scr 1.58, BUN 34 down trending;  LFTs / TGs: AST 50, ALT 77, Alk phos 170 all mildly elevated, TG 56 (on half rate TPN) Prealbumin / albumin: pre 25.4 up, alb 2.5 Intake / Output; MIVF: Soft diet, 1.1 ml/kg/hr, BM x2 GI Imaging: Surgeries / Procedures: 2/11 PICC exchange  Central access: PICC 11/16 - line exchanged 2/11 TPN start date: TPN 2/9 (also on it at home)   Nutritional Goals (per RD recommendation on 2/8): Kcal:  1950-2150 Protein:  95-110 grams Fluid:  > 1.9 L  Patient was on home TPN which was proving 1780 kcals and 100 gms of protein per day in 1800 mls.  It was being cycled over 21 hours per day.  Will cycle TPN over 20 hours. 51 ml/hr x 1 hr then 101 ml/hr x 18 hr then 51 ml/hr x 1 hr (provides 100 g of protein and 2060 kcals per day)   2/13-2/14: TPN rate was never turned up for the previous 24 hour TPN.  So, patient only got 51 ml/hr x 20 hours   Current Nutrition:  CLD  Plan:  Will cycle TPN over 20 hours (close to home regimen). 51 ml/hr x 1 hr then 101 ml/hr x 18 hr then 51 ml/hr x 1 hr (provides 100 g of protein and 2060 kcals per day) - Confirmed with RN that rate is  correctly 2/15 Electrolytes in TPN: continue 44mq/L of Na, 5146m/L of K, 46m31mL of Ca, increase to 53m66m of Mg, and decrease to 53mm27m of Phos. Change Cl:Ac ratio 1:2  Add standard MVI on MWF due to national shortage and trace elements to TPN Monitor TPN labs closely with worsening renal function   LydiaBenetta SparrmD, BCPS, BCCP Clinical Pharmacist  Please check AMION for all [facility] numbers After 10:00 PM, call [address]

## 2020-01-11 NOTE — Progress Notes (Signed)
Patient ID: Caitlin Peters, female   DOB: 04/06/1961, 59 y.o.   MRN: [id number] S: Feels well, no new complaints. O:BP (!) 132/58 (BP Location: Left Arm)   Pulse 60   Temp 98.5 F (36.9 C) (Oral)   Resp 18   Ht 5' 8"  (1.727 m)   Wt 60.1 kg   SpO2 99%   BMI 20.16 kg/m   Intake/Output Summary (Last 24 hours) at 01/11/2020 1638 Last data filed at 01/11/2020 1419 Gross per 24 hour  Intake 863.67 ml  Output 1450 ml  Net -586.33 ml   Intake/Output: I/O last 3 completed shifts: In: 1978.5 [P.O.:770; I.V.:1208.5] Out: 1650 [Urine:1650]  Intake/Output this shift:  Total I/O In: 480 [P.O.:480] Out: 150 [Urine:150] Weight change:  Gen: NAD CVS: no rub Resp: cta Abd: +BS, soft, NT Ext: no edema  Recent Labs  Lab 01/05/20 0500 01/06/20 0229 01/07/20 0516 01/08/20 0447 01/09/20 0401 01/10/20 0330 01/11/20 0305  NA 139 138 141 138 139 139 139  K 4.0 4.0 4.3 4.4 4.8 4.2 4.3  CL 107 110 106 111 113* 114* 114*  CO2 23 22 22  21* 20* 19* 18*  GLUCOSE 96 122* 125* 120* 97 90 99  BUN 14 9 14  26* 31* 36* 34*  CREATININE 1.53* 1.28* 1.22* 1.43* 1.56* 1.71* 1.58*  ALBUMIN 2.3*  --  2.8* 2.4*  --   --  2.5*  CALCIUM 8.9 8.8* 9.7 9.2 9.2 9.0 9.1  PHOS 2.9 2.6 3.0  --   --  4.6 4.6  AST 27  --  22 18  --   --  50*  ALT 119*  --  76* 53*  --   --  77*   Liver Function Tests: Recent Labs  Lab 01/07/20 0516 01/08/20 0447 01/11/20 0305  AST 22 18 50*  ALT 76* 53* 77*  ALKPHOS 251* 233* 170*  BILITOT 0.6 0.4 0.5  PROT 7.0 6.2* 6.0*  ALBUMIN 2.8* 2.4* 2.5*   No results for input(s): LIPASE, AMYLASE in the last 168 hours. No results for input(s): AMMONIA in the last 168 hours. CBC: Recent Labs  Lab 01/06/20 0229 01/06/20 0229 01/08/20 0447 01/11/20 0305 01/11/20 1158  WBC 3.4*   < > 4.8 3.6* 3.9*  NEUTROABS  --   --   --  2.2  --   HGB 8.0*   < > 8.1* 7.2* 7.7*  HCT 24.8*   < > 24.5* 23.3* 24.5*  MCV 91.5  --  91.8 96.3 101.7*  PLT 140*   < > 177 238 236   < > =  values in this interval not displayed.   Cardiac Enzymes: No results for input(s): CKTOTAL, CKMB, CKMBINDEX, TROPONINI in the last 168 hours. CBG: Recent Labs  Lab 01/10/20 2308 01/11/20 0424 01/11/20 0744 01/11/20 1145 01/11/20 1630  GLUCAP 97 93 101* 98 89    Iron Studies: No results for input(s): IRON, TIBC, TRANSFERRIN, FERRITIN in the last 72 hours. Studies/Results: US RENAL  Result Date: 01/10/2020 CLINICAL DATA:  Acute kidney injury EXAM: RENAL / URINARY TRACT ULTRASOUND COMPLETE COMPARISON:  CT 01/02/2020 FINDINGS: Right Kidney: Renal measurements: 9.1 by 4.2 x 5.9 cm = volume: 117.8 mL. Cortex is echogenic. No hydronephrosis or mass Left Kidney: Renal measurements: 8.8 x 4.7 x 4.7 cm = volume: 101 mL. Cortex is echogenic. No hydronephrosis or mass. Bladder: Appears normal for degree of bladder distention. Other: Dilated bowel loops containing echogenic material. IMPRESSION: Increased cortical echogenicity consistent with medical renal disease. Negative  for hydronephrosis Electronically Signed   By: Donavan Foil M.D.   On: 01/10/2020 15:17   . acidophilus  1 capsule Oral Daily  . amLODipine  5 mg Oral Daily  . budesonide  9 mg Oral Daily  . buPROPion  200 mg Oral Daily  . Chlorhexidine Gluconate Cloth  6 each Topical Daily  . cycloSPORINE  1 drop Both Eyes BID  . dicyclomine  20 mg Oral TID  . DULoxetine  90 mg Oral Daily  . enoxaparin (LOVENOX) injection  40 mg Subcutaneous Daily  . estradiol  2 mg Oral Daily  . famotidine  20 mg Oral QHS  . feeding supplement  1 Container Oral TID BM  . hydroxypropyl methylcellulose / hypromellose  1 drop Both Eyes QID  . lipase/protease/amylase  108,000 Units Oral TID AC  . methadone  10 mg Oral Q8H  . metoprolol tartrate  100 mg Oral BID  . pantoprazole  40 mg Oral Daily  . sodium chloride flush  3 mL Intravenous Q12H  . sucralfate  1 g Oral TID WC & HS    BMET    Component Value Date/Time   NA 139 01/11/2020 0305   K 4.3  01/11/2020 0305   CL 114 (H) 01/11/2020 0305   CO2 18 (L) 01/11/2020 0305   GLUCOSE 99 01/11/2020 0305   BUN 34 (H) 01/11/2020 0305   CREATININE 1.58 (H) 01/11/2020 0305   CALCIUM 9.1 01/11/2020 0305   GFRNONAA 36 (L) 01/11/2020 0305   GFRAA 41 (L) 01/11/2020 0305   CBC    Component Value Date/Time   WBC 3.9 (L) 01/11/2020 1158   RBC 2.41 (L) 01/11/2020 1158   HGB 7.7 (L) 01/11/2020 1158   HCT 24.5 (L) 01/11/2020 1158   PLT 236 01/11/2020 1158   MCV 101.7 (H) 01/11/2020 1158   MCH 32.0 01/11/2020 1158   MCHC 31.4 01/11/2020 1158   RDW 13.9 01/11/2020 1158   LYMPHSABS 1.1 01/11/2020 0305   MONOABS 0.2 01/11/2020 0305   EOSABS 0.1 01/11/2020 0305   BASOSABS 0.0 01/11/2020 0305     Assessment/Plan:  1. AKI/CKD stage 3- presumably hemodynamically mediated with mild ATN in setting of volume depletion.  Improving with IVF's.  Discussed case with Dr. Algis Liming to increase volume of free water given with TPN as an outpatient and recheck renal panel later this week.   2. Recurrent enterobacter bacteremia- presumably due to line infection.  On abx and s/p exchange of left femoral central venous catheter 3. Crohn's disease- with short gut syndrome and chronic diarrhea 4. Severe caloric and protein malnutrition on TPN at home 5. Anemia of chronic disease- stable  Donetta Potts, MD [employer] [phone]

## 2020-01-11 NOTE — Discharge Instructions (Addendum)

## 2020-01-11 NOTE — TOC Progression Note (Addendum)
Transition of Care [facility]) - Progression Note    Patient Details  Name: Caitlin Peters MRN: [id number] Date of Birth: 1961-08-24  Transition of Care [facility]) CM/SW Contact  Maryclare Labrador, RN Phone Number: 01/11/2020, 3:24 PM  Clinical Narrative:  Roel Cluck confirms that pt has been accepted for both TPN (resumption) and IV antibiotics.  [facility] will also resume HHRN and SOC will begin on tomorrow.   CM confirmed plan with pt.  Pt informed CM that she already has a PCP appt on 01/19/20.  Pt's daughter will transport her home at discharge.  No other CM needs determined - CM signing of  Expected Discharge Plan: [place] Barriers to Discharge: Continued Medical Work up  Expected Discharge Plan and Services Expected Discharge Plan: [place]   Discharge Planning Services: CM Consult Post Acute Care Choice: [place] arrangements for the past 2 months: Apartment                 DME Arranged: N/A         HH Arranged: RN           Social Determinants of Health (SDOH) Interventions    Readmission Risk Interventions Readmission Risk Prevention Plan 11/30/2019 10/30/2019  Transportation Screening Complete Complete  PCP or Specialist Appt within 3-5 Days - Not Complete  Not Complete comments - pending d/c date to arrange  [place] or [place] - Complete  Social Work Consult for [place] Planning/Counseling - Not Complete  SW consult not completed comments - RNCM met with pt  Palliative Care Screening - Not Applicable  Medication Review Press photographer) Complete Complete  PCP or Specialist appointment within 3-5 days of discharge Complete -  [place] or Home Care Consult Complete -  SW Recovery Care/Counseling Consult Complete -  Palliative Care Screening Not Applicable -  [place] Not Applicable -

## 2020-01-11 NOTE — Plan of Care (Signed)
  Problem: Education: Goal: Knowledge of General Education information will improve Description: Including pain rating scale, medication(s)/side effects and non-pharmacologic comfort measures Outcome: Progressing   Problem: Health Behavior/Discharge Planning: Goal: Ability to manage health-related needs will improve Outcome: Progressing   Problem: Clinical Measurements: Goal: Will remain free from infection Outcome: Progressing   Problem: Activity: Goal: Risk for activity intolerance will decrease Outcome: Progressing   Problem: Nutrition: Goal: Adequate nutrition will be maintained Outcome: Progressing   Problem: Pain Managment: Goal: General experience of comfort will improve Outcome: Progressing   Problem: Safety: Goal: Ability to remain free from injury will improve Outcome: Progressing   Problem: Skin Integrity: Goal: Risk for impaired skin integrity will decrease Outcome: Progressing

## 2020-01-11 NOTE — Discharge Summary (Signed)
Physician Discharge Summary  Caitlin Peters QMG:[id number] DOB: 02/23/1961  PCP: [name], [name], MD  Admitted from: Home Discharged to: Home  Admit date: 01/02/2020 Discharge date: 01/11/2020  Recommendations for Outpatient Follow-up:   Follow-up Information    [name], [name], MD. Schedule an appointment as soon as possible for a visit in 1 week(s).   Specialty: Internal Medicine Why: Please follow-up labs that will be drawn by home health services per TPN and OPAT protocol. Contact information: [address] [place] [address] [phone]            Home Health: RN Equipment/Devices: None  Discharge Condition: Improved and stable CODE STATUS: Full Diet recommendation: Heart healthy diet/soft diet as tolerated.  Patient is also chronically on TPN.  Discharge Diagnoses:  Principal Problem:   Crohn's colitis ([place]) Active Problems:   Fever   Short gut syndrome   HTN (hypertension)   GERD (gastroesophageal reflux disease)   Chronic pain syndrome   Acute kidney injury superimposed on chronic kidney disease ([name])   On total parenteral nutrition (TPN)   Elevated liver enzymes   Gram-negative bacteremia   Infection of peripherally inserted central venous catheter (PICC)   Brief Summary: 59 year old female with PMH of Crohn's disease, s/p short gut syndrome (multiple previous surgeries, SBO), home TPN dependent (since 2003) via left femoral tunneled CVC, recurrent bacteremia, HTN, chronic anemia requiring PRBC transfusions, chronic pain, depression, osteoporosis, GERD presented to ED on 2/6 with diarrhea consistent with her Crohn's disease, fever.  In ED febrile to 101 F.  CT showed biliary dilatation postcholecystectomy and possible colitis.  Found to be bacteremic.  ID, GI and IR consulted.  S/p exchange of tunneled left CFV cuffed central venous catheter by IR on 2/11.  Hospital course complicated by acute on chronic kidney disease.   Nephrology consulted on 2/14.   Assessment & Plan:   Recurrent Enterobacter bacteremia: Suspected due to line infection (did not meet sepsis criteria) versus less likely from GI source.  Left femoral CVC placed in 08/2019 in [place].  Reportedly difficult vascular access and hence femoral placed at that time.  Cycles TPN at home.  ID consulted, recommend continuing ertapenem, surveillance blood cultures from 2/8: Negative, final report.  S/p exchange of tunneled left CFV central venous catheter by IR on 2/11.  ID recommends continued IV ertapenem per OPAT protocol through 01/21/2020 and has follow-up appointment with ID on 01/27/2020 at 10:15 AM.  Crohn's disease, S/p multiple bowel surgery/short gut syndrome on chronic TPN, chronic diarrhea:.  CT abdomen 2/6 suggested possible colitis, no SBO or pneumoperitoneum.  Mildly elevated LFTs have almost normalized.  Acute hepatitis panel negative. C. difficile and GI panel PCR negative. Currently on Entocort and IV ertapenem.  On 2/11 reported worsening abdominal pain, evaluated by [facility], indicate that this is a manifestation of the patient's waxing and waning chronic abdominal pain related to her complex Crohn's disease and not a new process, no further testing planned and recommend outpatient follow-up with her pain MD for management.  Patient reports much improved pain control after starting home dose of methadone.  Stable.  Abnormal LFTs: [facility] input 2/9 appreciated.  Extensively evaluated during recent admission for same.  Now with recurrent elevated LFTs with enzymes in the 400s.  MRCP showed mild intra-/extrahepatic biliary ductal dilatation to the level of ampulla and diffusely dilated pancreatic duct.  GI indicates that this may be due to a passed stone which may explain her abnormal  liver tests and bacteremia.  No ERCP planned.  Considering EUS at some point.  Enzymes have almost normalized.  Acute kidney injury complicating stage III  chronic kidney disease: Creatinine has improved from 1.5-1.28, likely her baseline.  Creatinine progressively worsened despite IV fluid hydration, up to 1.7 on 2/14.  Nephrology consultation appreciated.  Suspect hemodynamically mediated, possibly with mild ATN and FENa 2%.  Checking repeat urine microscopy, no concerning medications on list.  Avoid nephrotoxins.  Clinically euvolemic or hypervolemic. Creatinine has improved to 1.58.  I discussed with rounding nephrologist today who has cleared her for discharge and advises increased IV fluids at home.  I personally talked to the patient's home health pharmacist who advised me that patient is on daily TPN 1800 mL of which 400 ml is free water.  We agreed on increasing her total TPN to 2000 mL/day of which 600 mL will be free water.  Pharmacist will continue to follow patient's labs.  Renal ultrasound showed medical renal disease and no hydronephrosis.  Anemia of chronic disease: Hemoglobin stable in the 8 g range.  Pancytopenia resolved.  Hemoglobin this morning was 7.2 which is a significant drop in the absence of overt bleeding.  Repeated hemoglobin and up to 7.7.  Patient states that her hemoglobin is usually in the 7 g range.  Follow CBC as outpatient.  Thrombocytopenia: Resolved  Essential hypertension: Currently on amlodipine 5 mg daily and metoprolol 100 mg twice daily.  Acceptable control.  Severe protein calorie malnutrition: On chronic TPN, continue.   Chronic pain syndrome: Judicious use of opioids.  Patient reports that she follows with Dr. Royce Macadamia, pain management.  Continue home dose of methadone and as needed Dilaudid.  No new prescriptions given for opioids.  Right lower lobe 6 mm pulmonary nodule: Noted on CT on admission.  Outpatient follow-up as per radiology recommendations.  Body mass index is 20.16 kg/m.  Nutritional Status Nutrition Problem: Increased nutrient needs Etiology: chronic illness(short gut sundrome  secondary to crohn's disease) Signs/Symptoms: estimated needs Interventions: Boost Breeze, Refer to RD note for recommendations    Consultants:   ID [facility] IR  Procedures:   Ongoing TPN via left femoral tunneled central venous catheter. S/p exchange of tunneled left CFV cuffed central venous catheter by IR on 2/11.   Discharge Instructions  Discharge Instructions    Call MD for:  difficulty breathing, headache or visual disturbances   Complete by: As directed    Call MD for:  extreme fatigue   Complete by: As directed    Call MD for:  persistant dizziness or light-headedness   Complete by: As directed    Call MD for:  persistant nausea and vomiting   Complete by: As directed    Call MD for:  redness, tenderness, or signs of infection (pain, swelling, redness, odor or green/yellow discharge around incision site)   Complete by: As directed    Call MD for:  severe uncontrolled pain   Complete by: As directed    Call MD for:  temperature >100.4   Complete by: As directed    Diet - low sodium heart healthy   Complete by: As directed    Soft diet.   Home infusion instructions   Complete by: As directed    Instructions: Flushing of vascular access device: 0.9% NaCl pre/post medication administration and prn patency; Heparin 100 u/ml, 67m for implanted ports and Heparin 10u/ml, 529mfor all other central venous catheters.   Increase activity slowly   Complete  by: As directed        Medication List    STOP taking these medications   loperamide 2 MG capsule Commonly known as: IMODIUM   potassium chloride 10 MEQ tablet Commonly known as: KLOR-CON     TAKE these medications   acetaminophen 325 MG tablet Commonly known as: TYLENOL Take 650 mg by mouth every 6 (six) hours as needed for mild pain.   amLODipine 5 MG tablet Commonly known as: NORVASC Take 1 tablet (5 mg total) by mouth daily.   budesonide 3 MG 24 hr capsule Commonly known as: ENTOCORT EC Take 3  capsules (9 mg total) by mouth daily.   buPROPion 100 MG 12 hr tablet Commonly known as: WELLBUTRIN SR Take 2 tablets (200 mg total) by mouth daily.   calcium carbonate 1250 (500 Ca) MG chewable tablet Commonly known as: OS-CAL Chew 1 tablet by mouth 2 (two) times daily.   cholecalciferol 25 MCG (1000 UNIT) tablet Commonly known as: VITAMIN D3 Take 2,000 Units by mouth daily.   cycloSPORINE 0.05 % ophthalmic emulsion Commonly known as: RESTASIS Place 1 drop into both eyes 2 (two) times daily.   Dexilant 60 MG capsule Generic drug: dexlansoprazole Take 1 capsule (60 mg total) by mouth daily.   Dextran 70-Hypromellose 0.1-0.3 % Soln Place 1 drop into both eyes 4 (four) times daily.   dicyclomine 20 MG tablet Commonly known as: BENTYL Take 20 mg by mouth 3 (three) times daily.   diphenoxylate-atropine 2.5-0.025 MG tablet Commonly known as: LOMOTIL Take 1 tablet by mouth 4 (four) times daily as needed for diarrhea or loose stools.   DULoxetine 30 MG capsule Commonly known as: CYMBALTA Take 3 capsules (90 mg total) by mouth daily.   ertapenem  IVPB Commonly known as: INVANZ Inject 1 g into the vein daily for 14 days. Indication:  E. [place]acae bacteremia/line infection Last Day of Therapy:  01/21/2020 Labs - Once weekly:  CBC/D and BMP, Labs - Every other week:  ESR and CRP   estradiol 2 MG tablet Commonly known as: ESTRACE Take 1 tablet (2 mg total) by mouth daily.   famotidine 20 MG tablet Commonly known as: PEPCID Take 20 mg by mouth at bedtime.   HYDROmorphone 4 MG tablet Commonly known as: Dilaudid Take 1 tablet (4 mg total) by mouth every 6 (six) hours as needed for severe pain.   lipase/protease/amylase 36000 UNITS Cpep capsule Commonly known as: CREON Take 3 capsules (108,000 Units total) by mouth 3 (three) times daily before meals.   methadone 10 MG tablet Commonly known as: DOLOPHINE Take 1 tablet (10 mg total) by mouth every 8 (eight) hours.    metoprolol tartrate 100 MG tablet Commonly known as: LOPRESSOR Take 100 mg by mouth 2 (two) times daily.   MULTIVITAMIN ADULT PO Take 1 tablet by mouth daily.   Omega-3 1000 MG Caps Take 1 capsule by mouth daily.   PRESCRIPTION MEDICATION Home TPN [facility] in [place]   PROBIOTIC-10 PO Take 1 capsule by mouth daily.   promethazine 25 MG tablet Commonly known as: PHENERGAN TAKE 1 TABLET BY MOUTH EVERY 6 HOURS AS NEEDED FOR NAUSEA FOR VOMITING What changed: See the new instructions.   sucralfate 1 GM/10ML suspension Commonly known as: Carafate Take 10 mLs (1 g total) by mouth 4 (four) times daily -  with meals and at bedtime.   traZODone 100 MG tablet Commonly known as: DESYREL Take 2 tablets (200 mg total) by mouth at bedtime as  needed for sleep.   vitamin B-12 100 MCG tablet Commonly known as: CYANOCOBALAMIN Take 1,000 mcg by mouth daily.      Allergies  Allergen Reactions  . Cefepime Other (See Comments)    Neurotoxicity occurring in setting of AKI. Ceftriaxone tolerated during same admit  . Gabapentin Other (See Comments)    unknown  . Hyoscyamine Hives and Swelling    Legs swelling   Disorientation  . Lyrica [Pregabalin] Other (See Comments)    unknown  . Meperidine Hives    Other reaction(s): GI Upset Due to [place]   . Topamax [Topiramate] Other (See Comments)    unknown  . Fentanyl Rash    Pt is allergic to fentanyl patch related to the glue (gives her a rash) Pt states she is NOT allergic to fentanyl IV medicine  . Morphine And Related Rash      Procedures/Studies: CT ABDOMEN PELVIS W CONTRAST  Result Date: 01/02/2020 CLINICAL DATA:  59 year old female with acute abdominal and pelvic pain with fever. History of Crohn's disease and chronic kidney disease. EXAM: CT ABDOMEN AND PELVIS WITH CONTRAST TECHNIQUE: Multidetector CT imaging of the abdomen and pelvis was performed using the standard protocol following bolus administration of  intravenous contrast. CONTRAST:  39m OMNIPAQUE IOHEXOL 300 MG/ML  SOLN COMPARISON:  12/08/2019 and prior CTs FINDINGS: Lower chest: A 6 mm RIGHT LOWER lobe nodule (series 4: Image 4) is noted. It is difficult to determine if this area was imaged on prior abdomen/pelvic CTs. Hepatobiliary: The liver is unremarkable. The patient is status post cholecystectomy. Mild CBD and intrahepatic biliary dilatation again noted. Pancreas: Mild pancreatic ductal dilatation is unchanged. No focal hepatic abnormalities noted. Spleen: Mild splenomegaly appears increased. Adrenals/Urinary Tract: The kidneys, adrenal glands and bladder are unremarkable. Stomach/Bowel: Evidence of partial colectomy with ileocolonic anastomosis again noted. Unchanged mildly distended fluid and gas-filled colon within the LEFT abdomen and pelvis with equivocal mild circumferential wall thickening. No dilated small bowel loops are noted. Little significant changes noted since the prior study. Vascular/Lymphatic: A LEFT femoral catheter is noted. Mild aortic atherosclerotic calcification noted without aneurysm. No definite enlarged nodes identified. Reproductive: Status post hysterectomy. No adnexal masses. Other: No ascites, abscess or pneumoperitoneum. Musculoskeletal: No acute or suspicious bony abnormality. IMPRESSION: 1. Unchanged mildly distended loops of fluid and gas filled colon within the LEFT abdomen and pelvis with equivocal mild circumferential wall thickening. This may represent a colitis. No evidence of small bowel obstruction or pneumoperitoneum. 2. Mild splenomegaly, apparent slightly increased from the prior study. 3. 6 mm RIGHT LOWER lobe nodule. Non-contrast chest CT at 6-12 months is recommended. If the nodule is stable at time of repeat CT, then future CT at 18-24 months (from today's scan) is considered optional for low-risk patients, but is recommended for high-risk patients. This recommendation follows the consensus statement:  Guidelines for Management of Incidental Pulmonary Nodules Detected on CT Images: From the Fleischner Society 2017; Radiology 2017; 284:228-243. 4. Aortic Atherosclerosis (ICD10-I70.0). Electronically Signed   By: JMargarette CanadaM.D.   On: 01/02/2020 09:14   [place]NAL  Result Date: 01/10/2020 CLINICAL DATA:  Acute kidney injury EXAM: RENAL / URINARY TRACT ULTRASOUND COMPLETE COMPARISON:  CT 01/02/2020 FINDINGS: Right Kidney: Renal measurements: 9.1 by 4.2 x 5.9 cm = volume: 117.8 mL. Cortex is echogenic. No hydronephrosis or mass Left Kidney: Renal measurements: 8.8 x 4.7 x 4.7 cm = volume: 101 mL. Cortex is echogenic. No hydronephrosis or mass. Bladder: Appears normal for degree of bladder distention. Other:  Dilated bowel loops containing echogenic material. IMPRESSION: Increased cortical echogenicity consistent with medical renal disease. Negative for hydronephrosis Electronically Signed   By: Donavan Foil M.D.   On: 01/10/2020 15:17   MR ABDOMEN MRCP WO CONTRAST  Result Date: 01/03/2020 CLINICAL DATA:  Elevated liver enzymes.  Mild biliary distention. EXAM: MRI ABDOMEN WITHOUT CONTRAST  (INCLUDING MRCP) TECHNIQUE: Multiplanar multisequence MR imaging of the abdomen was performed. Heavily T2-weighted images of the biliary and pancreatic ducts were obtained, and three-dimensional MRCP images were rendered by post processing. COMPARISON:  CT scan 01/02/2020.  CT scan 11/25/2019. FINDINGS: Lower chest: Heart size upper normal. Hepatobiliary: Liver measures 20 cm craniocaudal length. No focal abnormality in the liver parenchyma on this noncontrast study. Gallbladder surgically absent. Only minimal prominence of the intrahepatic bile ducts with extrahepatic common duct measuring 9 mm diameter in the hepatoduodenal ligament. Common bile duct measures 10 mm in the head of the pancreas. Overall, the intra and extrahepatic biliary ducts are decreased in distention when comparing back to the CT of 11/25/2019. The common  bile duct was 14 mm in diameter when remeasured in the same dimension at the same level on that study. No evidence for choledocholithiasis. Pancreas: As before, the main pancreatic duct is dilated throughout its length from the tail to the ampulla. Dilatation measures in the 5-6 mm range, also decrease since 11/25/2019 when I remeasure at a similar level at 8 mm. No focal mass lesion noted within the pancreatic parenchyma on this noncontrast motion degraded study. Spleen: Upper normal at 12.5 cm craniocaudal length. No focal abnormality. Adrenals/Urinary Tract: No adrenal nodule or mass. Kidneys unremarkable. Stomach/Bowel: Stomach is nondistended. No obvious small bowel dilatation. Colon in the left abdomen is distended, better demonstrated on recent CT scan. Vascular/Lymphatic: No abdominal aortic aneurysm. No bulky abdominal lymphadenopathy evident. Other:  No intraperitoneal free fluid. Musculoskeletal: No abnormal marrow signal within the visualized bony anatomy. IMPRESSION: 1. Motion degraded study, obscuring fine detail. 2. Mild intra and extrahepatic biliary duct dilatation to the level of the ampulla. The degree of distention has decreased when comparing to CT scan of 11/25/2019. No choledocholithiasis. No obstructing mass lesion evident on this noncontrast motion degraded study. 3. Diffuse dilatation of the main pancreatic duct throughout its length again noted. Distention has also decreased when comparing to 11/25/2019. Pancreatic duct distention extends from the tail to the ampulla. No intraluminal stone or obstructing lesion evident. 4. Hepatomegaly Electronically Signed   By: Misty Stanley M.D.   On: 01/03/2020 09:20   IR Fluoro Guide CV Line Left  Result Date: 01/07/2020 INDICATION: 59 year old female with a history of a presumed line infection referred for exchange. Given her history of poor venous access, we elected to exchange the indwelling drain. She has a history of short gut syndrome,  secondary to inflammatory bowel disease and prior surgery, with ongoing need for TPN EXAM: IMAGE GUIDED EXCHANGE OF TUNNELED CENTRAL VENOUS CATHETER BALLOON ANGIOPLASTY OF VENOUS STENOSIS MEDICATIONS: None ANESTHESIA/SEDATION: Moderate (conscious) sedation was employed during this procedure. A total of Versed 2.0 mg and Fentanyl 100 mcg was administered intravenously. Moderate Sedation Time: 40 minutes. [name] level of consciousness and vital signs were monitored continuously by radiology nursing throughout the procedure under my direct supervision. FLUOROSCOPY TIME:  Fluoroscopy Time: 8 minutes 42 seconds (40 mGy). COMPLICATIONS: None PROCEDURE: Informed written consent was obtained from the patient after a thorough discussion of the procedural risks, benefits and alternatives. All questions were addressed. Maximal Sterile Barrier Technique was utilized including  caps, mask, sterile gowns, sterile gloves, sterile drape, hand hygiene and skin antiseptic. A timeout was performed prior to the initiation of the procedure. Patient was positioned supine position on the fluoroscopy table. The left inguinal region and the indwelling left tunneled cuffed central venous line were prepped and draped in the usual sterile fashion. 1% lidocaine was used for local anesthesia. The retention suture was ligated and a wire was passed centrally through the indwelling cuffed central venous catheter. Wired was placed into the lower IVC. The indwelling catheter was freed from the tract with blunt dissection and removed on the wire. A new single-lumen cuffed central venous catheter (single-lumen PICC modified at 35 cm) was then passed on the wire into position. Passing this over the wire, we did identify a narrowed region within the left iliac system where the catheter had difficulty passing. Ultimately the single-lumen catheter was passed into the IVC, with the cuff terminating in a suitable location in the soft tissue tract. Upon  initially placing this single-lumen catheter the patient then stated that she had intended to have a double lumen catheter which she had discussed with her GI physicians. This was the first she head requested the double-lumen catheter and so we capitulated and removed the single-lumen cuffed catheter from the wire for placement of a double-lumen cuffed central venous catheter. The 6 French double-lumen cuffed catheter was ligated at 35 cm and attempted to pass over the wire. This would not pass, encountering stenosis in the distal iliac system. With 2 tandem wires through both channels the wire would still not pass with the additional stiffness. Ultimately we elected to balloon angioplasty the length of the venous tract anticipating a fibrin sheath. The double-lumen catheter was passed as far cranially in the iliac system as possible which was just inside the region of the inguinal ligament. A second 180 cm length fathom 014 wire was passed through the second lumen, in tandem to the first wire. The fathom wire was passed in the IVC. The catheter was removed and then a 4 mm x 150 mm coyote balloon was passed on the fathom wire, for balloon angioplasty along the length of the venous stenosis. The balloon was removed from the wire and then the catheter was passed easily over the fathom wire into the IVC. The tip of the catheter ultimately terminate in the lower IVC above the confluence of the iliac veins. The cuff was within the soft tissue tract. Catheter was sutured in position and a sterile dressing was placed. Patient tolerated the procedure well and remained hemodynamically stable throughout. No complications were encountered and no significant blood loss. IMPRESSION: Status post exchange of left tunneled central venous catheter with the cuff, with placement of a new double lumen 6 French cuffed catheter measuring 34 cm. The catheter would only pass through the venous tract on the wire after 4 mm balloon  angioplasty of the tract, presumably of a restrictive fibrin sheath. Signed, Dulcy Fanny. Dellia Nims, RPVI [facility] Electronically Signed   By: Corrie Mckusick D.O.   On: 01/07/2020 17:02   IR PTA VENOUS EXCEPT DIALYSIS CIRCUIT  Result Date: 01/07/2020 INDICATION: 59 year old female with a history of a presumed line infection referred for exchange. Given her history of poor venous access, we elected to exchange the indwelling drain. She has a history of short gut syndrome, secondary to inflammatory bowel disease and prior surgery, with ongoing need for TPN EXAM: IMAGE GUIDED EXCHANGE OF TUNNELED CENTRAL VENOUS  CATHETER BALLOON ANGIOPLASTY OF VENOUS STENOSIS MEDICATIONS: None ANESTHESIA/SEDATION: Moderate (conscious) sedation was employed during this procedure. A total of Versed 2.0 mg and Fentanyl 100 mcg was administered intravenously. Moderate Sedation Time: 40 minutes. [name] level of consciousness and vital signs were monitored continuously by radiology nursing throughout the procedure under my direct supervision. FLUOROSCOPY TIME:  Fluoroscopy Time: 8 minutes 42 seconds (40 mGy). COMPLICATIONS: None PROCEDURE: Informed written consent was obtained from the patient after a thorough discussion of the procedural risks, benefits and alternatives. All questions were addressed. Maximal Sterile Barrier Technique was utilized including caps, mask, sterile gowns, sterile gloves, sterile drape, hand hygiene and skin antiseptic. A timeout was performed prior to the initiation of the procedure. Patient was positioned supine position on the fluoroscopy table. The left inguinal region and the indwelling left tunneled cuffed central venous line were prepped and draped in the usual sterile fashion. 1% lidocaine was used for local anesthesia. The retention suture was ligated and a wire was passed centrally through the indwelling cuffed central venous catheter.  Wired was placed into the lower IVC. The indwelling catheter was freed from the tract with blunt dissection and removed on the wire. A new single-lumen cuffed central venous catheter (single-lumen PICC modified at 35 cm) was then passed on the wire into position. Passing this over the wire, we did identify a narrowed region within the left iliac system where the catheter had difficulty passing. Ultimately the single-lumen catheter was passed into the IVC, with the cuff terminating in a suitable location in the soft tissue tract. Upon initially placing this single-lumen catheter the patient then stated that she had intended to have a double lumen catheter which she had discussed with her GI physicians. This was the first she head requested the double-lumen catheter and so we capitulated and removed the single-lumen cuffed catheter from the wire for placement of a double-lumen cuffed central venous catheter. The 6 French double-lumen cuffed catheter was ligated at 35 cm and attempted to pass over the wire. This would not pass, encountering stenosis in the distal iliac system. With 2 tandem wires through both channels the wire would still not pass with the additional stiffness. Ultimately we elected to balloon angioplasty the length of the venous tract anticipating a fibrin sheath. The double-lumen catheter was passed as far cranially in the iliac system as possible which was just inside the region of the inguinal ligament. A second 180 cm length fathom 014 wire was passed through the second lumen, in tandem to the first wire. The fathom wire was passed in the IVC. The catheter was removed and then a 4 mm x 150 mm coyote balloon was passed on the fathom wire, for balloon angioplasty along the length of the venous stenosis. The balloon was removed from the wire and then the catheter was passed easily over the fathom wire into the IVC. The tip of the catheter ultimately terminate in the lower IVC above the confluence of  the iliac veins. The cuff was within the soft tissue tract. Catheter was sutured in position and a sterile dressing was placed. Patient tolerated the procedure well and remained hemodynamically stable throughout. No complications were encountered and no significant blood loss. IMPRESSION: Status post exchange of left tunneled central venous catheter with the cuff, with placement of a new double lumen 6 French cuffed catheter measuring 34 cm. The catheter would only pass through the venous tract on the wire after 4 mm balloon angioplasty of the tract, presumably of  a restrictive fibrin sheath. Signed, Dulcy Fanny. Dellia Nims, RPVI [facility] Electronically Signed   By: Corrie Mckusick D.O.   On: 01/07/2020 17:02      Subjective: Patient denies complaints.  Ready to go home.  Tolerating diet.  No pain reported today.  No bleeding.  As per RN, no acute issues noted.   Discharge Exam:  Vitals:   01/10/20 1803 01/10/20 2058 01/11/20 0426 01/11/20 1305  BP: 121/70 (!) 134/51 (!) 145/65 (!) 132/58  Pulse: 97 70 76 60  Resp: 18 18 18 18   Temp: 99.3 F (37.4 C) 98.9 F (37.2 C) 98.7 F (37.1 C) 98.5 F (36.9 C)  TempSrc: Oral Oral Oral Oral  SpO2: 96% 100% 100% 99%  Weight:      Height:        General exam: Pleasant middle-aged female, small built, frail, chronically ill looking sitting up comfortably in bed without distress. Respiratory system: Clear to auscultation.  No increased work of breathing. Cardiovascular system: S1 and S2 heard, RRR.  No JVD, murmurs. Gastrointestinal system: Abdomen is nondistended, soft and nontender.  Normal bowel sounds heard. Central nervous system: Alert and oriented. No focal neurological deficits. Extremities: Symmetric 5 x 5 power.  New left femoral central line site without acute findings.  No edema of extremities. Skin: No rashes, lesions or ulcers Psychiatry: Judgement and insight appear normal. Mood  & affect pleasant and appropriate     The results of significant diagnostics from this hospitalization (including imaging, microbiology, ancillary and laboratory) are listed below for reference.     Microbiology: Recent Results (from the past 240 hour(s))  Blood culture (routine x 2)     Status: Abnormal   Collection Time: 01/02/20  5:30 AM   Specimen: BLOOD  Result Value Ref Range Status   Specimen Description BLOOD RIGHT ANTECUBITAL  Final   Special Requests   Final    BOTTLES DRAWN AEROBIC AND ANAEROBIC Blood Culture results may not be optimal due to an inadequate volume of blood received in culture bottles   Culture  Setup Time   Final    GRAM NEGATIVE RODS IN BOTH AEROBIC AND ANAEROBIC BOTTLES CRITICAL RESULT CALLED TO, READ BACK BY AND VERIFIED WITH: Dillingham C. [phone] FCP Performed at [facility], [place] [address] [place], [place] [address]    Culture ENTEROBACTER CLOACAE (A)  Final   Report Status 01/05/2020 FINAL  Final   Organism ID, Bacteria ENTEROBACTER CLOACAE  Final      Susceptibility   Enterobacter cloacae - MIC*    CEFAZOLIN >=64 RESISTANT Resistant     CEFEPIME <=0.12 SENSITIVE Sensitive     CEFTAZIDIME <=1 SENSITIVE Sensitive     CIPROFLOXACIN <=0.25 SENSITIVE Sensitive     GENTAMICIN <=1 SENSITIVE Sensitive     IMIPENEM <=0.25 SENSITIVE Sensitive     TRIMETH/SULFA <=20 SENSITIVE Sensitive     PIP/TAZO 8 SENSITIVE Sensitive     * ENTEROBACTER CLOACAE  Blood culture (routine x 2)     Status: Abnormal   Collection Time: 01/02/20  5:30 AM   Specimen: BLOOD  Result Value Ref Range Status   Specimen Description BLOOD LEFT ANTECUBITAL  Final   Special Requests   Final    BOTTLES DRAWN AEROBIC AND ANAEROBIC Blood Culture adequate volume   Culture  Setup Time   Final    GRAM NEGATIVE RODS IN BOTH AEROBIC AND ANAEROBIC BOTTLES    Culture (A)  Final  ENTEROBACTER CLOACAE SUSCEPTIBILITIES PERFORMED ON PREVIOUS CULTURE WITHIN THE LAST 5  DAYS. Performed at [facility], [place] [address], [place], [place] [address]    Report Status 01/05/2020 FINAL  Final  Blood Culture ID Panel (Reflexed)     Status: Abnormal   Collection Time: 01/02/20  5:30 AM  Result Value Ref Range Status   Enterococcus species NOT DETECTED NOT DETECTED Final   Listeria monocytogenes NOT DETECTED NOT DETECTED Final   Staphylococcus species NOT DETECTED NOT DETECTED Final   Staphylococcus aureus (BCID) NOT DETECTED NOT DETECTED Final   Streptococcus species NOT DETECTED NOT DETECTED Final   Streptococcus agalactiae NOT DETECTED NOT DETECTED Final   Streptococcus pneumoniae NOT DETECTED NOT DETECTED Final   Streptococcus pyogenes NOT DETECTED NOT DETECTED Final   Acinetobacter baumannii NOT DETECTED NOT DETECTED Final   Enterobacteriaceae species DETECTED (A) NOT DETECTED Final    Comment: Enterobacteriaceae represent a large family of gram-negative bacteria, not a single organism. CRITICAL RESULT CALLED TO, READ BACK BY AND VERIFIED WITH: PHARMD LYDIA C. [id number] FCP    Enterobacter cloacae complex DETECTED (A) NOT DETECTED Final    Comment: CRITICAL RESULT CALLED TO, READ BACK BY AND VERIFIED WITH: PHARMD LYDIA C. [phone] FCP    Escherichia coli NOT DETECTED NOT DETECTED Final   Klebsiella oxytoca NOT DETECTED NOT DETECTED Final   Klebsiella pneumoniae NOT DETECTED NOT DETECTED Final   Proteus species NOT DETECTED NOT DETECTED Final   Serratia marcescens NOT DETECTED NOT DETECTED Final   Carbapenem resistance NOT DETECTED NOT DETECTED Final   Haemophilus influenzae NOT DETECTED NOT DETECTED Final   Neisseria meningitidis NOT DETECTED NOT DETECTED Final   Pseudomonas aeruginosa NOT DETECTED NOT DETECTED Final   Candida albicans NOT DETECTED NOT DETECTED Final   Candida glabrata NOT DETECTED NOT DETECTED Final   Candida krusei NOT DETECTED NOT DETECTED Final   Candida parapsilosis NOT DETECTED NOT DETECTED Final   Candida tropicalis  NOT DETECTED NOT DETECTED Final    Comment: Performed at [facility], [place] [address] [place], [place] [address]  Novel Coronavirus, NAA (Hosp order, Send-out to Ref Lab; TAT 18-24 hrs     Status: None   Collection Time: 01/02/20  7:48 AM   Specimen: Nasopharyngeal Swab; Respiratory  Result Value Ref Range Status   SARS-CoV-2, NAA NOT DETECTED NOT DETECTED Final    Comment: (NOTE) This nucleic acid amplification test was developed and its performance characteristics determined by [employer]. Nucleic acid amplification tests include RT-PCR and TMA. This test has not been FDA cleared or approved. This test has been authorized by FDA under an Emergency Use Authorization (EUA). This test is only authorized for the duration of time the declaration that circumstances exist justifying the authorization of the emergency use of in vitro diagnostic tests for detection of SARS-CoV-2 virus and/or diagnosis of COVID-19 infection under section 564(b)(1) of the Act, 21 U.S.C. 916XIH-0(T) (1), unless the authorization is terminated or revoked sooner. When diagnostic testing is negative, the possibility of a false negative result should be considered in the context of a patient's recent exposures and the presence of clinical signs and symptoms consistent with COVID-19. An individual without symptoms of COVID- 19 and who is not shedding SARS-CoV-2  virus would expect to have a negative (not detected) result in this assay. Performed At: [facility] [address] [place], [place] [id number] Rush Farmer MD JZ:[id number]    [place]  Final  Comment: Performed at [facility], [place] [address] [place], [place] [address]  C difficile quick scan w PCR reflex     Status: None   Collection Time: 01/03/20 12:11 AM   Specimen: STOOL  Result Value Ref Range Status   C Diff antigen NEGATIVE NEGATIVE Final   C Diff toxin NEGATIVE NEGATIVE Final   C  Diff interpretation No C. difficile detected.  Final    Comment: Performed at [facility], [place] [address] [place], [place] [address]  GI pathogen panel by PCR, stool     Status: None   Collection Time: 01/03/20 12:11 AM   Specimen: STOOL  Result Value Ref Range Status   Plesiomonas shigelloides NOT DETECTED NOT DETECTED Final   Yersinia enterocolitica NOT DETECTED NOT DETECTED Final   Vibrio NOT DETECTED NOT DETECTED Final   Enteropathogenic E coli NOT DETECTED NOT DETECTED Final   E coli (ETEC) LT/ST NOT DETECTED NOT DETECTED Final   E coli 3220 by PCR Not applicable NOT DETECTED Final   Cryptosporidium by PCR NOT DETECTED NOT DETECTED Final   Entamoeba histolytica NOT DETECTED NOT DETECTED Final   Adenovirus F 40/41 NOT DETECTED NOT DETECTED Final   Norovirus GI/GII NOT DETECTED NOT DETECTED Final   Sapovirus NOT DETECTED NOT DETECTED Final    Comment: (NOTE) Performed At: [facility] [place], [place] [id number] Rush Farmer MD JS:[id number]    Vibrio cholerae NOT DETECTED NOT DETECTED Final   Campylobacter by PCR NOT DETECTED NOT DETECTED Final   Salmonella by PCR NOT DETECTED NOT DETECTED Final   E coli (STEC) NOT DETECTED NOT DETECTED Final   Enteroaggregative E coli NOT DETECTED NOT DETECTED Final   Shigella by PCR NOT DETECTED NOT DETECTED Final   Cyclospora cayetanensis NOT DETECTED NOT DETECTED Final   Astrovirus NOT DETECTED NOT DETECTED Final   G lamblia by PCR NOT DETECTED NOT DETECTED Final   Rotavirus A by PCR NOT DETECTED NOT DETECTED Final  Culture, blood (Routine X 2) w Reflex to ID Panel     Status: None   Collection Time: 01/04/20  6:47 AM   Specimen: BLOOD RIGHT HAND  Result Value Ref Range Status   Specimen Description BLOOD RIGHT HAND  Final   Special Requests   Final    BOTTLES DRAWN AEROBIC ONLY Blood Culture results may not be optimal due to an inadequate volume of blood received in culture bottles   Culture   Final     NO GROWTH 5 DAYS Performed at [facility], [address], [place], [place] [address]    Report Status 01/09/2020 FINAL  Final     Labs: CBC: Recent Labs  Lab 01/06/20 0229 01/08/20 0447 01/11/20 0305 01/11/20 1158  WBC 3.4* 4.8 3.6* 3.9*  NEUTROABS  --   --  2.2  --   HGB 8.0* 8.1* 7.2* 7.7*  HCT 24.8* 24.5* 23.3* 24.5*  MCV 91.5 91.8 96.3 101.7*  PLT 140* 177 238 062    Basic Metabolic Panel: Recent Labs  Lab 01/05/20 0500 01/05/20 0500 01/06/20 0229 01/06/20 0229 01/07/20 0516 01/08/20 0447 01/09/20 0401 01/10/20 0330 01/11/20 0305  NA 139   < > 138   < > 141 138 139 139 139  K 4.0   < > 4.0   < > 4.3 4.4 4.8 4.2 4.3  CL 107   < > 110   < > 106 111 113* 114* 114*  CO2 23   < >  22   < > 22 21* 20* 19* 18*  GLUCOSE 96   < > 122*   < > 125* 120* 97 90 99  BUN 14   < > 9   < > 14 26* 31* 36* 34*  CREATININE 1.53*   < > 1.28*   < > 1.22* 1.43* 1.56* 1.71* 1.58*  CALCIUM 8.9   < > 8.8*   < > 9.7 9.2 9.2 9.0 9.1  MG 1.8  --  1.6*  --  1.9  --   --  1.8 1.9  PHOS 2.9  --  2.6  --  3.0  --   --  4.6 4.6   < > = values in this interval not displayed.    Liver Function Tests: Recent Labs  Lab 01/05/20 0500 01/07/20 0516 01/08/20 0447 01/11/20 0305  AST 27 22 18  50*  ALT 119* 76* 53* 77*  ALKPHOS 232* 251* 233* 170*  BILITOT 1.1 0.6 0.4 0.5  PROT 6.2* 7.0 6.2* 6.0*  ALBUMIN 2.3* 2.8* 2.4* 2.5*    CBG: Recent Labs  Lab 01/10/20 2058 01/10/20 2308 01/11/20 0424 01/11/20 0744 01/11/20 1145  GLUCAP 94 97 93 101* 98     Lipid Profile Recent Labs    01/11/20 0305  TRIG 56    Urinalysis    Component Value Date/Time   COLORURINE YELLOW 01/10/2020 1732   APPEARANCEUR HAZY (A) 01/10/2020 1732   LABSPEC 1.012 01/10/2020 1732   PHURINE 5.0 01/10/2020 1732   GLUCOSEU NEGATIVE 01/10/2020 1732   HGBUR NEGATIVE 01/10/2020 1732   BILIRUBINUR NEGATIVE 01/10/2020 1732   KETONESUR NEGATIVE 01/10/2020 1732   PROTEINUR NEGATIVE 01/10/2020 1732    NITRITE NEGATIVE 01/10/2020 1732   LEUKOCYTESUR TRACE (A) 01/10/2020 1732      Time coordinating discharge: 50 minutes  SIGNED:  Vernell Leep, MD, FACP, [facility]. [facility]  To contact the attending provider between 7A-7P or the covering provider during after hours 7P-7A, please log into the web site [email or web address] and access using universal [place] password for that web site. If you do not have the password, please call the hospital operator.

## 2020-01-11 NOTE — Progress Notes (Signed)
AVS given and reviewed with pt. Medications discussed and printed prescription provided to pt. All questions answered to satisfaction. Pt verbalized understanding of information given. Pt to be escorted off the unit with all belongings via wheelchair by staff member.

## 2020-01-13 ENCOUNTER — Telehealth: Payer: Self-pay

## 2020-01-13 NOTE — Telephone Encounter (Signed)
Transition Care Management Follow-up Telephone Call  Date of discharge and from where: 01/11/20 from [facility]  How have you been since you were released from the hospital? "feeling much better"  Any questions or concerns? No   Items Reviewed:  Did the pt receive and understand the discharge instructions provided? Yes   Medications obtained and verified? Yes   Any new allergies since your discharge? No   Dietary orders reviewed? Yes  Do you have support at home? Yes   Other (ie: DME, Home Health, etc) Home health RN and TPN equipment  Functional Questionnaire: (I = Independent and D = Dependent) ADL's: independent with most ADL's  Bathing/Dressing-    Meal Prep-   Eating-   Maintaining continence-   Transferring/Ambulation-   Managing Meds-    Follow up appointments reviewed:    PCP Hospital f/u appt confirmed? Yes  Scheduled to see Dutch Quint, FNP on 01/19/20 @ 0830.  [facility] f/u appt confirmed? No    Are transportation arrangements needed? No   If their condition worsens, is the pt aware to call  their PCP or go to the ED? Yes  Was the patient provided with contact information for the PCP's office or ED? Yes  Was the pt encouraged to call back with questions or concerns? Yes

## 2020-01-15 ENCOUNTER — Encounter: Payer: Self-pay | Admitting: Infectious Disease

## 2020-01-15 ENCOUNTER — Telehealth: Payer: Self-pay | Admitting: Internal Medicine

## 2020-01-15 NOTE — Telephone Encounter (Signed)
Left message on machine for Caitlin Peters Attempted to call daughter but unable to leave a message.

## 2020-01-15 NOTE — Telephone Encounter (Signed)
Spoke with daughter and virtual visit is now in office visit and she will get her xray at the same visit.

## 2020-01-15 NOTE — Telephone Encounter (Signed)
Debbie from [facility] wanted to inform that pick line white piece is sticking out about 7cm. Small blood return but not enough to draw labs.    Debbie: [phone]

## 2020-01-19 ENCOUNTER — Encounter: Payer: Self-pay | Admitting: Family

## 2020-01-19 ENCOUNTER — Telehealth: Payer: Self-pay | Admitting: Internal Medicine

## 2020-01-19 ENCOUNTER — Ambulatory Visit (INDEPENDENT_AMBULATORY_CARE_PROVIDER_SITE_OTHER): Payer: Medicare Other | Admitting: Family

## 2020-01-19 ENCOUNTER — Other Ambulatory Visit: Payer: Self-pay

## 2020-01-19 ENCOUNTER — Ambulatory Visit: Payer: Medicare Other | Admitting: Internal Medicine

## 2020-01-19 ENCOUNTER — Ambulatory Visit (INDEPENDENT_AMBULATORY_CARE_PROVIDER_SITE_OTHER): Payer: Medicare Other

## 2020-01-19 VITALS — BP 120/80 | HR 60 | Temp 97.3°F | Wt 126.7 lb

## 2020-01-19 DIAGNOSIS — E1169 Type 2 diabetes mellitus with other specified complication: Secondary | ICD-10-CM | POA: Diagnosis not present

## 2020-01-19 DIAGNOSIS — K219 Gastro-esophageal reflux disease without esophagitis: Secondary | ICD-10-CM | POA: Diagnosis not present

## 2020-01-19 DIAGNOSIS — R112 Nausea with vomiting, unspecified: Secondary | ICD-10-CM

## 2020-01-19 DIAGNOSIS — E785 Hyperlipidemia, unspecified: Secondary | ICD-10-CM

## 2020-01-19 DIAGNOSIS — Z95828 Presence of other vascular implants and grafts: Secondary | ICD-10-CM

## 2020-01-19 DIAGNOSIS — K50818 Crohn's disease of both small and large intestine with other complication: Secondary | ICD-10-CM

## 2020-01-19 LAB — H. PYLORI ANTIBODY, IGG: H Pylori IgG: NEGATIVE

## 2020-01-19 NOTE — Patient Instructions (Signed)

## 2020-01-19 NOTE — Progress Notes (Signed)
Established Patient Office Visit  Subjective:  Patient ID: Caitlin Peters, female    DOB: 1961-03-27  Age: 59 y.o. MRN: [id number]  CC: No chief complaint on file.   HPI Caitlin Peters presents with complaints of the PICC line in her left femoral vein moving approx 7 mm. She has a long history of Crohn's disease and has had multiple lines placed. She reports recently being discharged from the hospital and has had increased nausea with 2 episodes of vomiting over the last 5 days. She reports never having H pylori bacteria. She has had increased heartburn and burping. She reports her daughter cooking all meals from scratch and feels like she eats healthy. She is concerned about the COVID-19 vaccine and wants to know if it is safe for her. Rheumatologist encouraged her to get it but wanted the PCP perspective.   Would like paperwork completed for the SCAT bus since her daughter will be returning to work soon.  Past Medical History:  Diagnosis Date  . Anasarca 10/2019  . AVN (avascular necrosis of bone) ([place])   . Cataract   . Chronic pain syndrome   . CKD (chronic kidney disease), stage III   . Crohn disease ([place])   . Crohn disease ([place])   . Depression   . Diverticulosis   . GERD (gastroesophageal reflux disease)   . HTN (hypertension)   . IDA (iron deficiency anemia)   . Malnutrition ([place])   . Mass in chest   . Osteoporosis   . Pancreatitis   . Short gut syndrome   . Vitamin B12 deficiency     Past Surgical History:  Procedure Laterality Date  . ABDOMINAL HYSTERECTOMY    . ABDOMINAL SURGERY     colon resection with abdominal stoma, repair of intestinal leak, reversal of abdominal stoma  . BILIARY DILATION  11/26/2019   Procedure: BILIARY DILATION;  Surgeon: Jackquline Denmark, MD;  Location: [facility];  Service: Endoscopy;;  . CHEST WALL RESECTION     right thoracotomy,resection of chest mass with anterior rib and reconstruction using prosthetic mesh and video arthroscopy    . CHOLECYSTECTOMY    . COLONOSCOPY  2019  . ERCP N/A 11/26/2019   Procedure: ENDOSCOPIC RETROGRADE CHOLANGIOPANCREATOGRAPHY (ERCP);  Surgeon: Jackquline Denmark, MD;  Location: [facility];  Service: Endoscopy;  Laterality: N/A;  . IR FLUORO GUIDE CV LINE LEFT  01/07/2020  . IR PTA VENOUS EXCEPT DIALYSIS CIRCUIT  01/07/2020  . IR US GUIDE VASC ACCESS LEFT     x 2 06/17/19 and 09/14/2019  . KNEE SURGERY     right knee   . REMOVAL OF STONES  11/26/2019   Procedure: REMOVAL OF STONES;  Surgeon: Jackquline Denmark, MD;  Location: [facility];  Service: Endoscopy;;  . SMALL INTESTINE SURGERY     x3  . SPHINCTEROTOMY  11/26/2019   Procedure: SPHINCTEROTOMY;  Surgeon: Jackquline Denmark, MD;  Location: [facility];  Service: Endoscopy;;  . UPPER GASTROINTESTINAL ENDOSCOPY      Family History  Problem Relation Age of Onset  . Breast cancer Sister   . Multiple sclerosis Sister   . Diabetes Sister   . Lupus Sister   . Colon cancer Other   . Crohn's disease Other   . Seizures Mother   . Glaucoma Mother   . CAD Father   . Heart disease Father   . Hypertension Father     Social History   Socioeconomic History  . Marital status: Single    Spouse  name: Not on file  . Number of children: Not on file  . Years of education: Not on file  . Highest education level: Not on file  Occupational History  . Not on file  Tobacco Use  . Smoking status: Former Research scientist (life sciences)  . Smokeless tobacco: Never Used  Substance and Sexual Activity  . Alcohol use: Not Currently  . Drug use: Never  . Sexual activity: Not on file  Other Topics Concern  . Not on file  Social History Narrative  . Not on file   Social Determinants of Health   Financial Resource Strain:   . Difficulty of Paying Living Expenses: Not on file  Food Insecurity:   . Worried About Charity fundraiser in the Last Year: Not on file  . Ran Out of Food in the Last Year: Not on file  Transportation Needs:   . Lack of Transportation (Medical): Not  on file  . Lack of Transportation (Non-Medical): Not on file  Physical Activity:   . Days of Exercise per Week: Not on file  . Minutes of Exercise per Session: Not on file  Stress:   . Feeling of Stress : Not on file  Social Connections:   . Frequency of Communication with Friends and Family: Not on file  . Frequency of Social Gatherings with Friends and Family: Not on file  . Attends Religious Services: Not on file  . Active Member of Clubs or Organizations: Not on file  . Attends Archivist Meetings: Not on file  . Marital Status: Not on file  Intimate Partner Violence:   . Fear of Current or Ex-Partner: Not on file  . Emotionally Abused: Not on file  . Physically Abused: Not on file  . Sexually Abused: Not on file    Outpatient Medications Prior to Visit  Medication Sig Dispense Refill  . acetaminophen (TYLENOL) 325 MG tablet Take 650 mg by mouth every 6 (six) hours as needed for mild pain.     [name] Kitchen amLODipine (NORVASC) 5 MG tablet Take 1 tablet (5 mg total) by mouth daily. 90 tablet 1  . budesonide (ENTOCORT EC) 3 MG 24 hr capsule Take 3 capsules (9 mg total) by mouth daily. 90 capsule 2  . buPROPion (WELLBUTRIN SR) 100 MG 12 hr tablet Take 2 tablets (200 mg total) by mouth daily. 60 tablet 2  . calcium carbonate (OS-CAL) 1250 (500 Ca) MG chewable tablet Chew 1 tablet by mouth 2 (two) times daily.    . cholecalciferol (VITAMIN D3) 25 MCG (1000 UT) tablet Take 2,000 Units by mouth daily.    . cycloSPORINE (RESTASIS) 0.05 % ophthalmic emulsion Place 1 drop into both eyes 2 (two) times daily.    [name] Kitchen dexlansoprazole (DEXILANT) 60 MG capsule Take 1 capsule (60 mg total) by mouth daily. 90 capsule 1  . Dextran 70-Hypromellose 0.1-0.3 % SOLN Place 1 drop into both eyes 4 (four) times daily.    [name] Kitchen dicyclomine (BENTYL) 20 MG tablet Take 20 mg by mouth 3 (three) times daily.    . diphenoxylate-atropine (LOMOTIL) 2.5-0.025 MG tablet Take 1 tablet by mouth 4 (four) times daily as needed  for diarrhea or loose stools. 30 tablet 0  . DULoxetine (CYMBALTA) 30 MG capsule Take 3 capsules (90 mg total) by mouth daily.  3  . ertapenem (INVANZ) IVPB Inject 1 g into the vein daily for 14 days. Indication:  E. [place]ae bacteremia/line infection Last Day of Therapy:  01/21/2020 Labs - Once weekly:  CBC/D  and BMP, Labs - Every other week:  ESR and CRP 14 Units 0  . estradiol (ESTRACE) 2 MG tablet Take 1 tablet (2 mg total) by mouth daily. 90 tablet 1  . famotidine (PEPCID) 20 MG tablet Take 20 mg by mouth at bedtime.    [name] Kitchen HYDROmorphone (DILAUDID) 4 MG tablet Take 1 tablet (4 mg total) by mouth every 6 (six) hours as needed for severe pain. 10 tablet 0  . lipase/protease/amylase (CREON) 36000 UNITS CPEP capsule Take 3 capsules (108,000 Units total) by mouth 3 (three) times daily before meals. 180 capsule 1  . methadone (DOLOPHINE) 10 MG tablet Take 1 tablet (10 mg total) by mouth every 8 (eight) hours.    . metoprolol tartrate (LOPRESSOR) 100 MG tablet Take 100 mg by mouth 2 (two) times daily.    . Multiple Vitamins-Minerals (MULTIVITAMIN ADULT PO) Take 1 tablet by mouth daily.    . Omega-3 1000 MG CAPS Take 1 capsule by mouth daily.    [name] Kitchen PRESCRIPTION MEDICATION Home TPN [facility]    . Probiotic Product (PROBIOTIC-10 PO) Take 1 capsule by mouth daily.    . promethazine (PHENERGAN) 25 MG tablet TAKE 1 TABLET BY MOUTH EVERY 6 HOURS AS NEEDED FOR NAUSEA FOR VOMITING (Patient taking differently: Take 25 mg by mouth every 6 (six) hours as needed for nausea or vomiting. ) 90 tablet 0  . sucralfate (CARAFATE) 1 GM/10ML suspension Take 10 mLs (1 g total) by mouth 4 (four) times daily -  with meals and at bedtime. 420 mL 2  . traZODone (DESYREL) 100 MG tablet Take 2 tablets (200 mg total) by mouth at bedtime as needed for sleep. 60 tablet 0  . vitamin B-12 (CYANOCOBALAMIN) 100 MCG tablet Take 1,000 mcg by mouth daily.      No facility-administered medications prior to visit.     Allergies  Allergen Reactions  . Cefepime Other (See Comments)    Neurotoxicity occurring in setting of AKI. Ceftriaxone tolerated during same admit  . Gabapentin Other (See Comments)    unknown  . Hyoscyamine Hives and Swelling    Legs swelling   Disorientation  . Lyrica [Pregabalin] Other (See Comments)    unknown  . Meperidine Hives    Other reaction(s): GI Upset Due to [place]   . Topamax [Topiramate] Other (See Comments)    unknown  . Fentanyl Rash    Pt is allergic to fentanyl patch related to the glue (gives her a rash) Pt states she is NOT allergic to fentanyl IV medicine  . Morphine And Related Rash    ROS Review of Systems  Constitutional: Negative.  Negative for activity change.  Respiratory: Negative.   Cardiovascular: Negative.   Gastrointestinal: Negative.  Negative for abdominal distention and blood in stool.  Endocrine: Negative.   Musculoskeletal: Negative.   Allergic/Immunologic: Negative.   Neurological: Negative.   Psychiatric/Behavioral: Negative.       Objective:    Physical Exam  Constitutional: She is oriented to person, place, and time. She appears well-developed.  Slim but nourished  Neck: No thyromegaly present.  Cardiovascular: Normal rate, regular rhythm and normal heart sounds.  PICC line in the right groin. Good blood return. Minimally displaced ~77m  Pulmonary/Chest: Effort normal and breath sounds normal.  Abdominal: Soft. Bowel sounds are normal. She exhibits no distension. There is no abdominal tenderness.  Musculoskeletal:        General: Normal range of motion.     Cervical back: Normal  range of motion and neck supple.     Comments: Ambulates well with a walker  Neurological: She is alert and oriented to person, place, and time.  Skin: Skin is warm and dry.  Psychiatric: She has a normal mood and affect.    There were no vitals taken for this visit. Wt Readings from Last 3 Encounters:  01/05/20 132 lb 9.6 oz (60.1  kg)  12/15/19 130 lb (59 kg)  12/07/19 130 lb (59 kg)     Health Maintenance Due  Topic Date Due  . PAP SMEAR-Modifier  03/19/1982    There are no preventive care reminders to display for this patient.  Lab Results  Component Value Date   TSH 0.54 10/16/2019   Lab Results  Component Value Date   WBC 3.9 (L) 01/11/2020   HGB 7.7 (L) 01/11/2020   HCT 24.5 (L) 01/11/2020   MCV 101.7 (H) 01/11/2020   PLT 236 01/11/2020   Lab Results  Component Value Date   NA 139 01/11/2020   K 4.3 01/11/2020   CO2 18 (L) 01/11/2020   GLUCOSE 99 01/11/2020   BUN 34 (H) 01/11/2020   CREATININE 1.58 (H) 01/11/2020   BILITOT 0.5 01/11/2020   ALKPHOS 170 (H) 01/11/2020   AST 50 (H) 01/11/2020   ALT 77 (H) 01/11/2020   PROT 6.0 (L) 01/11/2020   ALBUMIN 2.5 (L) 01/11/2020   CALCIUM 9.1 01/11/2020   ANIONGAP 7 01/11/2020   GFR 41.16 (L) 10/16/2019   No results found for: CHOL No results found for: HDL No results found for: [facility] Lab Results  Component Value Date   TRIG 56 01/11/2020   No results found for: [facility] Lab Results  Component Value Date   HGBA1C 4.7 (L) 10/28/2019      Assessment & Plan:  1. PICC line partial displacement: Xray obtained to confirm.  2. Nausea/Vomiting: Hpylori bacteria obtained. Continued Dexilant and Promethazine as needed 3. Crohn's Disease 4. Decreased mobility: SCAT paperwork completed and given to patient    Follow-up: 3 months, pending labs, and sooner as needed.   Kennyth Arnold

## 2020-01-19 NOTE — Telephone Encounter (Signed)
[place] and has been on TPN at home for several years. Carolynn Sayers the IV nurse with [facility] stated they have had orders back in December to treat her up until now so they are trying to get those orders accepted until the pt completes her transfer to the doc she had seen previously in [place].   Pam stated she wanted to inform the PCP that she will be dropping off a packet of orders-TPN and management of the TPN  Pam can be reached at [phone]

## 2020-01-19 NOTE — Telephone Encounter (Signed)
Left message on machine for patient that the [employer] forms are ready to be picked up.

## 2020-01-20 NOTE — Telephone Encounter (Signed)
Spoke with Debbie at [facility] and given [employer].

## 2020-01-26 ENCOUNTER — Telehealth: Payer: Self-pay

## 2020-01-26 NOTE — Telephone Encounter (Signed)
COVID-19 Pre-Screening Questions:01/26/20   Do you currently have a fever (>100 F), chills or unexplained body aches? NO  Are you currently experiencing new cough, shortness of breath, sore throat, runny nose? NO  .  Have you recently travelled outside the state of [place] in the last 14 days?NO  Have you been in contact with someone that is currently pending confirmation of Covid19 testing or has been confirmed to have the [place] virus?  NO  **If the patient answers NO to ALL questions -  advise the patient to please call the clinic before coming to the office should any symptoms develop.

## 2020-01-27 ENCOUNTER — Ambulatory Visit (INDEPENDENT_AMBULATORY_CARE_PROVIDER_SITE_OTHER): Payer: Medicare Other | Admitting: Infectious Disease

## 2020-01-27 ENCOUNTER — Other Ambulatory Visit: Payer: Self-pay

## 2020-01-27 ENCOUNTER — Encounter: Payer: Self-pay | Admitting: Infectious Disease

## 2020-01-27 VITALS — Wt 133.0 lb

## 2020-01-27 DIAGNOSIS — K50818 Crohn's disease of both small and large intestine with other complication: Secondary | ICD-10-CM | POA: Diagnosis not present

## 2020-01-27 DIAGNOSIS — T80219D Unspecified infection due to central venous catheter, subsequent encounter: Secondary | ICD-10-CM | POA: Diagnosis present

## 2020-01-27 DIAGNOSIS — R7881 Bacteremia: Secondary | ICD-10-CM

## 2020-01-27 DIAGNOSIS — K90829 Short bowel syndrome, unspecified: Secondary | ICD-10-CM

## 2020-01-27 DIAGNOSIS — K912 Postsurgical malabsorption, not elsewhere classified: Secondary | ICD-10-CM | POA: Diagnosis not present

## 2020-01-27 NOTE — Progress Notes (Signed)
Subjective:  Chief complaint: She has some concerns about the appearance of the PICC line at its entry site being a white color where is purple the remainder of its length.\    Patient ID: Caitlin Peters, female    DOB: 1961-09-06, 59 y.o.   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  She currently has a femoral IV access PICC line that was exchanged during her most recent hospitalization when we saw her in February.  She was admitted in February with Enterobacter cloacae bacteremia she was treated with Zosyn then changed to ertapenem for ease of administration at home.  Bacteremia cleared and radiology were able to exchange a new line over her old IV access site.  We had made plans to try to have her have alcohol locks for her PICC line but apparently her home health company that she is working with is not doing that.  She is complete her antimicrobials is doing well she did have some concern about the appearance of the PICC line as it enters into the skin and I examined this with Orland Mustard here.  She has no evidence of return of symptoms at this point in time but unfortunately given her history we know we will likely see her have recurrent bacteremias in the future   Past Medical History:  Diagnosis Date  . Anasarca 10/2019  . AVN (avascular necrosis of bone) ([place])   . Cataract   . Chronic pain syndrome   . CKD (chronic kidney disease), stage III   . Crohn disease ([place])   . Crohn disease ([place])   . Depression   . Diverticulosis   . GERD (gastroesophageal reflux disease)   . HTN (hypertension)   . IDA (iron deficiency anemia)   . Malnutrition ([place])   . Mass in chest   .  Osteoporosis   . Pancreatitis   . Short gut syndrome   . Vitamin B12 deficiency     Past Surgical History:  Procedure Laterality Date  . ABDOMINAL HYSTERECTOMY    . ABDOMINAL SURGERY     colon resection with abdominal stoma, repair of intestinal leak, reversal of abdominal stoma  . BILIARY DILATION  11/26/2019   Procedure: BILIARY DILATION;  Surgeon: Jackquline Denmark, MD;  Location: [facility];  Service: Endoscopy;;  . CHEST WALL RESECTION     right thoracotomy,resection of chest mass with anterior rib and reconstruction using prosthetic mesh and video arthroscopy  . CHOLECYSTECTOMY    . COLONOSCOPY  2019  . ERCP N/A 11/26/2019   Procedure: ENDOSCOPIC RETROGRADE CHOLANGIOPANCREATOGRAPHY (ERCP);  Surgeon: Jackquline Denmark, MD;  Location: [facility];  Service: Endoscopy;  Laterality: N/A;  . IR FLUORO GUIDE CV LINE LEFT  01/07/2020  . IR PTA VENOUS EXCEPT DIALYSIS CIRCUIT  01/07/2020  . IR US GUIDE VASC ACCESS LEFT     x 2 06/17/19 and 09/14/2019  . KNEE SURGERY     right knee   . REMOVAL OF STONES  11/26/2019   Procedure: REMOVAL OF STONES;  Surgeon: Jackquline Denmark, MD;  Location: [facility];  Service: Endoscopy;;  . SMALL INTESTINE SURGERY     x3  . SPHINCTEROTOMY  11/26/2019   Procedure: SPHINCTEROTOMY;  Surgeon: Jackquline Denmark, MD;  Location: [facility];  Service: Endoscopy;;  . UPPER GASTROINTESTINAL ENDOSCOPY      Family History  Problem Relation Age of Onset  . Breast cancer Sister   . Multiple sclerosis Sister   . Diabetes Sister   . Lupus Sister   . Colon cancer Other   . Crohn's disease Other   . Seizures Mother   . Glaucoma Mother   . CAD Father   . Heart disease Father   . Hypertension Father       Social History   Socioeconomic History  . Marital status: Single    Spouse name: Not on file  . Number of children: Not on file  . Years of education: Not on file  . Highest education level: Not on file  Occupational History  . Not on file  Tobacco Use  .  Smoking status: Former Research scientist (life sciences)  . Smokeless tobacco: Never Used  Substance and Sexual Activity  . Alcohol use: Not Currently  . Drug use: Never  . Sexual activity: Not on file  Other Topics Concern  . Not on file  Social History Narrative  . Not on file   Social Determinants of Health   Financial Resource Strain:   . Difficulty of Paying Living Expenses: Not on file  Food Insecurity:   . Worried About Charity fundraiser in the Last Year: Not on file  . Ran Out of Food in the Last Year: Not on file  Transportation Needs:   . Lack of Transportation (Medical): Not on file  . Lack of Transportation (Non-Medical): Not on file  Physical Activity:   . Days of Exercise per Week: Not on file  . Minutes of Exercise per Session: Not on file  Stress:   . Feeling of Stress : Not on file  Social Connections:   . Frequency of Communication with Friends and Family: Not on file  . Frequency of Social Gatherings with Friends and Family: Not on file  . Attends Religious Services: Not on file  . Active Member of Clubs or Organizations: Not on file  . Attends Archivist Meetings: Not on file  . Marital Status: Not on file    Allergies  Allergen Reactions  . Cefepime Other (See Comments)    Neurotoxicity occurring in setting of AKI. Ceftriaxone tolerated during same admit  . Gabapentin Other (See Comments)    unknown  . Hyoscyamine Hives and Swelling    Legs swelling   Disorientation  . Lyrica [Pregabalin] Other (See Comments)    unknown  . Meperidine Hives    Other reaction(s): GI Upset Due to Chrones   . Topamax [Topiramate] Other (See Comments)    unknown  . Fentanyl Rash    Pt is allergic to fentanyl patch related to the glue (gives her a rash) Pt states she is NOT allergic to fentanyl IV medicine  . Morphine And Related Rash     Current Outpatient Medications:  .  acetaminophen (TYLENOL) 325 MG tablet, Take 650 mg by mouth every 6 (six) hours as needed for mild  pain. , Disp: , Rfl:  .  amLODipine (NORVASC) 5 MG tablet, Take 1 tablet (5 mg total) by mouth daily., Disp: 90 tablet, Rfl: 1 .  budesonide (ENTOCORT EC) 3 MG 24 hr capsule, Take 3 capsules (9 mg total) by mouth daily., Disp: 90 capsule, Rfl: 2 .  buPROPion (WELLBUTRIN SR) 100 MG 12 hr tablet, Take 2 tablets (200 mg total)  by mouth daily., Disp: 60 tablet, Rfl: 2 .  calcium carbonate (OS-CAL) 1250 (500 Ca) MG chewable tablet, Chew 1 tablet by mouth 2 (two) times daily., Disp: , Rfl:  .  cholecalciferol (VITAMIN D3) 25 MCG (1000 UT) tablet, Take 2,000 Units by mouth daily., Disp: , Rfl:  .  cycloSPORINE (RESTASIS) 0.05 % ophthalmic emulsion, Place 1 drop into both eyes 2 (two) times daily., Disp: , Rfl:  .  dexlansoprazole (DEXILANT) 60 MG capsule, Take 1 capsule (60 mg total) by mouth daily., Disp: 90 capsule, Rfl: 1 .  Dextran 70-Hypromellose 0.1-0.3 % SOLN, Place 1 drop into both eyes 4 (four) times daily., Disp: , Rfl:  .  dicyclomine (BENTYL) 20 MG tablet, Take 20 mg by mouth 3 (three) times daily., Disp: , Rfl:  .  diphenoxylate-atropine (LOMOTIL) 2.5-0.025 MG tablet, Take 1 tablet by mouth 4 (four) times daily as needed for diarrhea or loose stools., Disp: 30 tablet, Rfl: 0 .  DULoxetine (CYMBALTA) 30 MG capsule, Take 3 capsules (90 mg total) by mouth daily., Disp: , Rfl: 3 .  estradiol (ESTRACE) 2 MG tablet, Take 1 tablet (2 mg total) by mouth daily., Disp: 90 tablet, Rfl: 1 .  famotidine (PEPCID) 20 MG tablet, Take 20 mg by mouth at bedtime., Disp: , Rfl:  .  HYDROmorphone (DILAUDID) 4 MG tablet, Take 1 tablet (4 mg total) by mouth every 6 (six) hours as needed for severe pain., Disp: 10 tablet, Rfl: 0 .  lipase/protease/amylase (CREON) 36000 UNITS CPEP capsule, Take 3 capsules (108,000 Units total) by mouth 3 (three) times daily before meals., Disp: 180 capsule, Rfl: 1 .  methadone (DOLOPHINE) 10 MG tablet, Take 1 tablet (10 mg total) by mouth every 8 (eight) hours., Disp: , Rfl:  .   metoprolol tartrate (LOPRESSOR) 100 MG tablet, Take 100 mg by mouth 2 (two) times daily., Disp: , Rfl:  .  Multiple Vitamins-Minerals (MULTIVITAMIN ADULT PO), Take 1 tablet by mouth daily., Disp: , Rfl:  .  Omega-3 1000 MG CAPS, Take 1 capsule by mouth daily., Disp: , Rfl:  .  PRESCRIPTION MEDICATION, Home TPN [employer] in [facility], Disp: , Rfl:  .  Probiotic Product (PROBIOTIC-10 PO), Take 1 capsule by mouth daily., Disp: , Rfl:  .  promethazine (PHENERGAN) 25 MG tablet, TAKE 1 TABLET BY MOUTH EVERY 6 HOURS AS NEEDED FOR NAUSEA FOR VOMITING (Patient taking differently: Take 25 mg by mouth every 6 (six) hours as needed for nausea or vomiting. ), Disp: 90 tablet, Rfl: 0 .  sucralfate (CARAFATE) 1 GM/10ML suspension, Take 10 mLs (1 g total) by mouth 4 (four) times daily -  with meals and at bedtime., Disp: 420 mL, Rfl: 2 .  traZODone (DESYREL) 100 MG tablet, Take 2 tablets (200 mg total) by mouth at bedtime as needed for sleep., Disp: 60 tablet, Rfl: 0 .  vitamin B-12 (CYANOCOBALAMIN) 100 MCG tablet, Take 1,000 mcg by mouth daily. , Disp: , Rfl:    Review of Systems  Constitutional: Negative for activity change, appetite change, chills, diaphoresis, fatigue, fever and unexpected weight change.  HENT: Negative for congestion, rhinorrhea, sinus pressure, sneezing, sore throat and trouble swallowing.   Eyes: Negative for photophobia and visual disturbance.  Respiratory: Negative for cough, chest tightness, shortness of breath, wheezing and stridor.   Cardiovascular: Negative for chest pain, palpitations and leg swelling.  Gastrointestinal: Negative for abdominal distention, abdominal pain, anal bleeding, blood in stool, constipation, diarrhea, nausea and vomiting.  Genitourinary: Negative for difficulty  urinating, dysuria, flank pain and hematuria.  Musculoskeletal: Negative for arthralgias, back pain, gait problem, joint swelling and myalgias.  Skin: Negative for color change, pallor, rash and  wound.  Neurological: Negative for dizziness, tremors, weakness and light-headedness.  Hematological: Negative for adenopathy. Does not bruise/bleed easily.  Psychiatric/Behavioral: Negative for agitation, behavioral problems, confusion, decreased concentration, dysphoric mood and sleep disturbance.       Objective:   Physical Exam Constitutional:      General: She is not in acute distress.    Appearance: She is well-developed. She is not diaphoretic.  HENT:     Head: Normocephalic and atraumatic.     Mouth/Throat:     Pharynx: No oropharyngeal exudate.  Eyes:     General: No scleral icterus.    Conjunctiva/sclera: Conjunctivae normal.  Cardiovascular:     Rate and Rhythm: Normal rate and regular rhythm.  Pulmonary:     Effort: Pulmonary effort is normal. No respiratory distress.     Breath sounds: No wheezing.  Abdominal:     General: There is no distension.  Musculoskeletal:        General: No tenderness.     Cervical back: Normal range of motion and neck supple.  Skin:    General: Skin is warm and dry.     Coloration: Skin is not pale.     Findings: No erythema or rash.  Neurological:     Mental Status: She is alert and oriented to person, place, and time.     Motor: No abnormal muscle tone.     Coordination: Coordination normal.  Psychiatric:        Attention and Perception: Attention and perception normal.        Mood and Affect: Affect normal. Mood is anxious.        Speech: Speech normal.        Behavior: Behavior normal.        Thought Content: Thought content normal.        Cognition and Memory: Cognition and memory normal.        Judgment: Judgment normal.    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  Continue to monitor for recurrent infections  and I have offered her the possibility that she could be seen the either here or if her primary care physician is willing to be seen in the clinic when and if she develops symptoms of line infection so that we could directly admitted to the hospital rather than have her have to go to the emergency department.        Assessment & Plan:

## 2020-01-28 ENCOUNTER — Telehealth: Payer: Self-pay

## 2020-01-28 NOTE — Telephone Encounter (Signed)
Attempted to call patient in regards to some paper work she left in office. Would like to ask patient if she would like paperwork mailed to her home address or would she prefer to pick it up in office. Unable to reach patient at this time. Will leave copy at front desk in case she calls back. [place]

## 2020-01-31 ENCOUNTER — Other Ambulatory Visit: Payer: Self-pay | Admitting: Internal Medicine

## 2020-02-04 ENCOUNTER — Telehealth: Payer: Self-pay | Admitting: Internal Medicine

## 2020-02-04 DIAGNOSIS — Z1231 Encounter for screening mammogram for malignant neoplasm of breast: Secondary | ICD-10-CM

## 2020-02-04 MED ORDER — LOPERAMIDE HCL 2 MG PO CAPS: 2.0000 mg | ORAL_CAPSULE | ORAL | 0 refills | Status: DC | PRN

## 2020-02-04 NOTE — Telephone Encounter (Addendum)
Pt states she is due for a mammogram and needs a referral. She would like Jerilee Hoh to pick who to see.   Pt can be reached at [phone]  Also needs a mediation refilled: Loperamide 75m  Pharmacy: [facility] [phone]

## 2020-02-04 NOTE — Telephone Encounter (Signed)
Order for mammogram placed and refill sent.

## 2020-02-17 ENCOUNTER — Other Ambulatory Visit: Payer: Self-pay | Admitting: Internal Medicine

## 2020-02-17 ENCOUNTER — Other Ambulatory Visit: Payer: Self-pay

## 2020-02-17 ENCOUNTER — Ambulatory Visit
Admission: RE | Admit: 2020-02-17 | Discharge: 2020-02-17 | Disposition: A | Discharge status: Home / Self Care | Payer: Medicare Other | Source: Ambulatory Visit | Attending: Internal Medicine | Admitting: Internal Medicine

## 2020-02-17 DIAGNOSIS — Z1231 Encounter for screening mammogram for malignant neoplasm of breast: Secondary | ICD-10-CM

## 2020-02-25 ENCOUNTER — Ambulatory Visit: Payer: Medicare Other | Attending: Internal Medicine

## 2020-02-25 DIAGNOSIS — Z23 Encounter for immunization: Secondary | ICD-10-CM

## 2020-02-25 NOTE — Progress Notes (Signed)
   Covid-19 Vaccination Clinic  Name:  JAYLIANI WANNER    MRN: [id number] DOB: 1961-01-01  02/25/2020  Ms. Moore was observed post Covid-19 immunization for 15 minutes without incident. She was provided with Vaccine Information Sheet and instruction to access the V-Safe system.   Ms. Wahler was instructed to call 911 with any severe reactions post vaccine: [name] Kitchen Difficulty breathing  . Swelling of face and throat  . A fast heartbeat  . A bad rash all over body  . Dizziness and weakness   Immunizations Administered    Name Date Dose VIS Date Route   Pfizer COVID-19 Vaccine 02/25/2020 12:59 PM 0.3 mL 11/06/2019 Intramuscular   Manufacturer: [place]   Lot: [id number]   [place]: [id number]

## 2020-03-03 ENCOUNTER — Other Ambulatory Visit: Payer: Self-pay | Admitting: Internal Medicine

## 2020-03-03 MED ORDER — PROMETHAZINE HCL 25 MG PO TABS: ORAL_TABLET | ORAL | 0 refills | Status: DC

## 2020-03-03 MED ORDER — METOPROLOL TARTRATE 100 MG PO TABS: 100.0000 mg | ORAL_TABLET | Freq: Two times a day (BID) | ORAL | 1 refills | Status: DC

## 2020-03-03 NOTE — Telephone Encounter (Signed)
metoprolol tartrate (LOPRESSOR) 100 MG tablet and promethazine sent to   And Promethazine sent to  [place], [place]. Phone:  [phone]  Fax:  [phone]

## 2020-03-04 ENCOUNTER — Encounter (HOSPITAL_COMMUNITY): Payer: Self-pay | Admitting: Emergency Medicine

## 2020-03-04 ENCOUNTER — Inpatient Hospital Stay (HOSPITAL_COMMUNITY)
Admission: EM | Admit: 2020-03-04 | Discharge: 2020-03-11 | DRG: 314 | Disposition: A | Discharge status: Home Health | Payer: Medicare Other | Attending: Internal Medicine | Admitting: Internal Medicine

## 2020-03-04 ENCOUNTER — Other Ambulatory Visit: Payer: Self-pay

## 2020-03-04 DIAGNOSIS — A4159 Other Gram-negative sepsis: Secondary | ICD-10-CM | POA: Diagnosis present

## 2020-03-04 DIAGNOSIS — K838 Other specified diseases of biliary tract: Secondary | ICD-10-CM | POA: Diagnosis present

## 2020-03-04 DIAGNOSIS — F1120 Opioid dependence, uncomplicated: Secondary | ICD-10-CM | POA: Diagnosis present

## 2020-03-04 DIAGNOSIS — Z90710 Acquired absence of both cervix and uterus: Secondary | ICD-10-CM

## 2020-03-04 DIAGNOSIS — K219 Gastro-esophageal reflux disease without esophagitis: Secondary | ICD-10-CM | POA: Diagnosis present

## 2020-03-04 DIAGNOSIS — K2950 Unspecified chronic gastritis without bleeding: Secondary | ICD-10-CM | POA: Diagnosis present

## 2020-03-04 DIAGNOSIS — F329 Major depressive disorder, single episode, unspecified: Secondary | ICD-10-CM | POA: Diagnosis present

## 2020-03-04 DIAGNOSIS — Z888 Allergy status to other drugs, medicaments and biological substances status: Secondary | ICD-10-CM

## 2020-03-04 DIAGNOSIS — Z803 Family history of malignant neoplasm of breast: Secondary | ICD-10-CM

## 2020-03-04 DIAGNOSIS — A419 Sepsis, unspecified organism: Secondary | ICD-10-CM

## 2020-03-04 DIAGNOSIS — G92 Toxic encephalopathy: Secondary | ICD-10-CM | POA: Diagnosis present

## 2020-03-04 DIAGNOSIS — D509 Iron deficiency anemia, unspecified: Secondary | ICD-10-CM | POA: Diagnosis present

## 2020-03-04 DIAGNOSIS — D631 Anemia in chronic kidney disease: Secondary | ICD-10-CM | POA: Diagnosis present

## 2020-03-04 DIAGNOSIS — Z789 Other specified health status: Secondary | ICD-10-CM | POA: Diagnosis present

## 2020-03-04 DIAGNOSIS — K912 Postsurgical malabsorption, not elsewhere classified: Secondary | ICD-10-CM | POA: Diagnosis present

## 2020-03-04 DIAGNOSIS — N1832 Chronic kidney disease, stage 3b: Secondary | ICD-10-CM | POA: Diagnosis present

## 2020-03-04 DIAGNOSIS — Z800 Family history of malignant neoplasm of digestive organs: Secondary | ICD-10-CM

## 2020-03-04 DIAGNOSIS — G894 Chronic pain syndrome: Secondary | ICD-10-CM | POA: Diagnosis present

## 2020-03-04 DIAGNOSIS — Z79899 Other long term (current) drug therapy: Secondary | ICD-10-CM

## 2020-03-04 DIAGNOSIS — Y848 Other medical procedures as the cause of abnormal reaction of the patient, or of later complication, without mention of misadventure at the time of the procedure: Secondary | ICD-10-CM | POA: Diagnosis present

## 2020-03-04 DIAGNOSIS — K5090 Crohn's disease, unspecified, without complications: Secondary | ICD-10-CM | POA: Diagnosis present

## 2020-03-04 DIAGNOSIS — Z87891 Personal history of nicotine dependence: Secondary | ICD-10-CM

## 2020-03-04 DIAGNOSIS — Z820 Family history of epilepsy and other diseases of the nervous system: Secondary | ICD-10-CM

## 2020-03-04 DIAGNOSIS — T80211A Bloodstream infection due to central venous catheter, initial encounter: Principal | ICD-10-CM | POA: Diagnosis present

## 2020-03-04 DIAGNOSIS — D61818 Other pancytopenia: Secondary | ICD-10-CM | POA: Diagnosis present

## 2020-03-04 DIAGNOSIS — I129 Hypertensive chronic kidney disease with stage 1 through stage 4 chronic kidney disease, or unspecified chronic kidney disease: Secondary | ICD-10-CM | POA: Diagnosis present

## 2020-03-04 DIAGNOSIS — F32A Depression, unspecified: Secondary | ICD-10-CM | POA: Diagnosis present

## 2020-03-04 DIAGNOSIS — R945 Abnormal results of liver function studies: Secondary | ICD-10-CM

## 2020-03-04 DIAGNOSIS — Z832 Family history of diseases of the blood and blood-forming organs and certain disorders involving the immune mechanism: Secondary | ICD-10-CM

## 2020-03-04 DIAGNOSIS — Z79891 Long term (current) use of opiate analgesic: Secondary | ICD-10-CM

## 2020-03-04 DIAGNOSIS — Z8249 Family history of ischemic heart disease and other diseases of the circulatory system: Secondary | ICD-10-CM

## 2020-03-04 DIAGNOSIS — R6520 Severe sepsis without septic shock: Secondary | ICD-10-CM

## 2020-03-04 DIAGNOSIS — Z833 Family history of diabetes mellitus: Secondary | ICD-10-CM

## 2020-03-04 DIAGNOSIS — Z83511 Family history of glaucoma: Secondary | ICD-10-CM

## 2020-03-04 DIAGNOSIS — I10 Essential (primary) hypertension: Secondary | ICD-10-CM | POA: Diagnosis present

## 2020-03-04 DIAGNOSIS — H269 Unspecified cataract: Secondary | ICD-10-CM | POA: Diagnosis present

## 2020-03-04 DIAGNOSIS — R7881 Bacteremia: Secondary | ICD-10-CM

## 2020-03-04 DIAGNOSIS — N179 Acute kidney failure, unspecified: Secondary | ICD-10-CM | POA: Diagnosis present

## 2020-03-04 DIAGNOSIS — Z9049 Acquired absence of other specified parts of digestive tract: Secondary | ICD-10-CM

## 2020-03-04 DIAGNOSIS — R7989 Other specified abnormal findings of blood chemistry: Secondary | ICD-10-CM

## 2020-03-04 DIAGNOSIS — M810 Age-related osteoporosis without current pathological fracture: Secondary | ICD-10-CM | POA: Diagnosis present

## 2020-03-04 DIAGNOSIS — Z20822 Contact with and (suspected) exposure to covid-19: Secondary | ICD-10-CM | POA: Diagnosis present

## 2020-03-04 NOTE — ED Triage Notes (Addendum)
Per ems, pt from home. Pt reports body aches, nausea and fever of 101.38F with ems, pt took 555m Tylenol before ems arrival at home (unsure of what time). Hx of Crohns dz. Pt ambulatory with assistance, A&Ox4. EMS VSS. Reports temp of 103F at home. Pt had first Covid vaccine last week.

## 2020-03-05 ENCOUNTER — Encounter (HOSPITAL_COMMUNITY): Payer: Self-pay | Admitting: Radiology

## 2020-03-05 ENCOUNTER — Emergency Department (HOSPITAL_COMMUNITY): Payer: Medicare Other

## 2020-03-05 DIAGNOSIS — I10 Essential (primary) hypertension: Secondary | ICD-10-CM

## 2020-03-05 DIAGNOSIS — N1832 Chronic kidney disease, stage 3b: Secondary | ICD-10-CM

## 2020-03-05 DIAGNOSIS — A419 Sepsis, unspecified organism: Secondary | ICD-10-CM | POA: Diagnosis present

## 2020-03-05 DIAGNOSIS — G894 Chronic pain syndrome: Secondary | ICD-10-CM | POA: Diagnosis present

## 2020-03-05 DIAGNOSIS — Z789 Other specified health status: Secondary | ICD-10-CM

## 2020-03-05 DIAGNOSIS — R509 Fever, unspecified: Secondary | ICD-10-CM | POA: Diagnosis not present

## 2020-03-05 DIAGNOSIS — K50119 Crohn's disease of large intestine with unspecified complications: Secondary | ICD-10-CM

## 2020-03-05 DIAGNOSIS — I899 Noninfective disorder of lymphatic vessels and lymph nodes, unspecified: Secondary | ICD-10-CM | POA: Diagnosis not present

## 2020-03-05 DIAGNOSIS — Z95828 Presence of other vascular implants and grafts: Secondary | ICD-10-CM | POA: Diagnosis not present

## 2020-03-05 DIAGNOSIS — K3189 Other diseases of stomach and duodenum: Secondary | ICD-10-CM | POA: Diagnosis not present

## 2020-03-05 DIAGNOSIS — K5090 Crohn's disease, unspecified, without complications: Secondary | ICD-10-CM | POA: Diagnosis present

## 2020-03-05 DIAGNOSIS — D61818 Other pancytopenia: Secondary | ICD-10-CM | POA: Diagnosis present

## 2020-03-05 DIAGNOSIS — K8050 Calculus of bile duct without cholangitis or cholecystitis without obstruction: Secondary | ICD-10-CM | POA: Diagnosis not present

## 2020-03-05 DIAGNOSIS — D72819 Decreased white blood cell count, unspecified: Secondary | ICD-10-CM | POA: Diagnosis not present

## 2020-03-05 DIAGNOSIS — Z888 Allergy status to other drugs, medicaments and biological substances status: Secondary | ICD-10-CM | POA: Diagnosis not present

## 2020-03-05 DIAGNOSIS — Z87891 Personal history of nicotine dependence: Secondary | ICD-10-CM

## 2020-03-05 DIAGNOSIS — Z9989 Dependence on other enabling machines and devices: Secondary | ICD-10-CM

## 2020-03-05 DIAGNOSIS — A4159 Other Gram-negative sepsis: Secondary | ICD-10-CM | POA: Diagnosis present

## 2020-03-05 DIAGNOSIS — Z8249 Family history of ischemic heart disease and other diseases of the circulatory system: Secondary | ICD-10-CM | POA: Diagnosis not present

## 2020-03-05 DIAGNOSIS — Z833 Family history of diabetes mellitus: Secondary | ICD-10-CM | POA: Diagnosis not present

## 2020-03-05 DIAGNOSIS — R7881 Bacteremia: Secondary | ICD-10-CM | POA: Diagnosis not present

## 2020-03-05 DIAGNOSIS — K912 Postsurgical malabsorption, not elsewhere classified: Secondary | ICD-10-CM

## 2020-03-05 DIAGNOSIS — Z803 Family history of malignant neoplasm of breast: Secondary | ICD-10-CM | POA: Diagnosis not present

## 2020-03-05 DIAGNOSIS — F329 Major depressive disorder, single episode, unspecified: Secondary | ICD-10-CM | POA: Diagnosis present

## 2020-03-05 DIAGNOSIS — Z8619 Personal history of other infectious and parasitic diseases: Secondary | ICD-10-CM

## 2020-03-05 DIAGNOSIS — Z9889 Other specified postprocedural states: Secondary | ICD-10-CM | POA: Diagnosis not present

## 2020-03-05 DIAGNOSIS — Z832 Family history of diseases of the blood and blood-forming organs and certain disorders involving the immune mechanism: Secondary | ICD-10-CM | POA: Diagnosis not present

## 2020-03-05 DIAGNOSIS — Z820 Family history of epilepsy and other diseases of the nervous system: Secondary | ICD-10-CM | POA: Diagnosis not present

## 2020-03-05 DIAGNOSIS — I129 Hypertensive chronic kidney disease with stage 1 through stage 4 chronic kidney disease, or unspecified chronic kidney disease: Secondary | ICD-10-CM | POA: Diagnosis present

## 2020-03-05 DIAGNOSIS — R945 Abnormal results of liver function studies: Secondary | ICD-10-CM | POA: Diagnosis not present

## 2020-03-05 DIAGNOSIS — K8689 Other specified diseases of pancreas: Secondary | ICD-10-CM | POA: Diagnosis not present

## 2020-03-05 DIAGNOSIS — G92 Toxic encephalopathy: Secondary | ICD-10-CM | POA: Diagnosis present

## 2020-03-05 DIAGNOSIS — N179 Acute kidney failure, unspecified: Secondary | ICD-10-CM | POA: Diagnosis present

## 2020-03-05 DIAGNOSIS — M810 Age-related osteoporosis without current pathological fracture: Secondary | ICD-10-CM | POA: Diagnosis present

## 2020-03-05 DIAGNOSIS — Z79899 Other long term (current) drug therapy: Secondary | ICD-10-CM | POA: Diagnosis not present

## 2020-03-05 DIAGNOSIS — T80211A Bloodstream infection due to central venous catheter, initial encounter: Secondary | ICD-10-CM | POA: Diagnosis present

## 2020-03-05 DIAGNOSIS — Y848 Other medical procedures as the cause of abnormal reaction of the patient, or of later complication, without mention of misadventure at the time of the procedure: Secondary | ICD-10-CM | POA: Diagnosis present

## 2020-03-05 DIAGNOSIS — R933 Abnormal findings on diagnostic imaging of other parts of digestive tract: Secondary | ICD-10-CM | POA: Diagnosis not present

## 2020-03-05 DIAGNOSIS — R7989 Other specified abnormal findings of blood chemistry: Secondary | ICD-10-CM | POA: Diagnosis not present

## 2020-03-05 DIAGNOSIS — K50819 Crohn's disease of both small and large intestine with unspecified complications: Secondary | ICD-10-CM | POA: Diagnosis not present

## 2020-03-05 DIAGNOSIS — Z800 Family history of malignant neoplasm of digestive organs: Secondary | ICD-10-CM | POA: Diagnosis not present

## 2020-03-05 DIAGNOSIS — K838 Other specified diseases of biliary tract: Secondary | ICD-10-CM | POA: Diagnosis present

## 2020-03-05 DIAGNOSIS — B9689 Other specified bacterial agents as the cause of diseases classified elsewhere: Secondary | ICD-10-CM | POA: Diagnosis not present

## 2020-03-05 DIAGNOSIS — F1120 Opioid dependence, uncomplicated: Secondary | ICD-10-CM | POA: Diagnosis present

## 2020-03-05 DIAGNOSIS — D509 Iron deficiency anemia, unspecified: Secondary | ICD-10-CM | POA: Diagnosis present

## 2020-03-05 DIAGNOSIS — Z20822 Contact with and (suspected) exposure to covid-19: Secondary | ICD-10-CM | POA: Diagnosis present

## 2020-03-05 LAB — BLOOD CULTURE ID PANEL (REFLEXED)

## 2020-03-05 LAB — CBC WITH DIFFERENTIAL/PLATELET
Abs Immature Granulocytes: 0.01 10*3/uL (ref 0.00–0.07)
Basophils Absolute: 0 10*3/uL (ref 0.0–0.1)
Basophils Relative: 0 %
Eosinophils Absolute: 0 10*3/uL (ref 0.0–0.5)
Eosinophils Relative: 1 %
HCT: 30.6 % — ABNORMAL LOW (ref 36.0–46.0)
Hemoglobin: 9.9 g/dL — ABNORMAL LOW (ref 12.0–15.0)
Immature Granulocytes: 1 %
Lymphocytes Relative: 10 %
Lymphs Abs: 0.2 10*3/uL — ABNORMAL LOW (ref 0.7–4.0)
MCH: 31 pg (ref 26.0–34.0)
MCHC: 32.4 g/dL (ref 30.0–36.0)
MCV: 95.9 fL (ref 80.0–100.0)
Monocytes Absolute: 0 10*3/uL — ABNORMAL LOW (ref 0.1–1.0)
Monocytes Relative: 1 %
Neutro Abs: 1.3 10*3/uL — ABNORMAL LOW (ref 1.7–7.7)
Neutrophils Relative %: 87 %
Platelets: 123 10*3/uL — ABNORMAL LOW (ref 150–400)
RBC: 3.19 MIL/uL — ABNORMAL LOW (ref 3.87–5.11)
RDW: 14.3 % (ref 11.5–15.5)
WBC: 1.5 10*3/uL — ABNORMAL LOW (ref 4.0–10.5)
nRBC: 0 % (ref 0.0–0.2)

## 2020-03-05 LAB — URINALYSIS, ROUTINE W REFLEX MICROSCOPIC
Bacteria, UA: NONE SEEN
Bilirubin Urine: NEGATIVE
Glucose, UA: NEGATIVE mg/dL
Hgb urine dipstick: NEGATIVE
Ketones, ur: NEGATIVE mg/dL
Leukocytes,Ua: NEGATIVE
Nitrite: NEGATIVE
Protein, ur: 100 mg/dL — AB
Specific Gravity, Urine: 1.024 (ref 1.005–1.030)
pH: 7 (ref 5.0–8.0)

## 2020-03-05 LAB — COMPREHENSIVE METABOLIC PANEL
ALT: 607 U/L — ABNORMAL HIGH (ref 0–44)
AST: 884 U/L — ABNORMAL HIGH (ref 15–41)
Albumin: 3.1 g/dL — ABNORMAL LOW (ref 3.5–5.0)
Alkaline Phosphatase: 253 U/L — ABNORMAL HIGH (ref 38–126)
Anion gap: 10 (ref 5–15)
BUN: 39 mg/dL — ABNORMAL HIGH (ref 6–20)
CO2: 26 mmol/L (ref 22–32)
Calcium: 9.1 mg/dL (ref 8.9–10.3)
Chloride: 104 mmol/L (ref 98–111)
Creatinine, Ser: 1.58 mg/dL — ABNORMAL HIGH (ref 0.44–1.00)
GFR calc Af Amer: 41 mL/min — ABNORMAL LOW (ref >60)
GFR calc non Af Amer: 36 mL/min — ABNORMAL LOW (ref >60)
Glucose, Bld: 79 mg/dL (ref 70–99)
Potassium: 3.8 mmol/L (ref 3.5–5.1)
Sodium: 140 mmol/L (ref 135–145)
Total Bilirubin: 1 mg/dL (ref 0.3–1.2)
Total Protein: 6.8 g/dL (ref 6.5–8.1)

## 2020-03-05 LAB — PROTIME-INR
INR: 1 (ref 0.8–1.2)
Prothrombin Time: 13.4 seconds (ref 11.4–15.2)

## 2020-03-05 LAB — RESPIRATORY PANEL BY RT PCR (FLU A&B, COVID)
Influenza A by PCR: NEGATIVE
Influenza B by PCR: NEGATIVE
SARS Coronavirus 2 by RT PCR: NEGATIVE

## 2020-03-05 LAB — PROCALCITONIN: Procalcitonin: 84.79 ng/mL

## 2020-03-05 LAB — CK: Total CK: 67 U/L (ref 38–234)

## 2020-03-05 LAB — LACTIC ACID, PLASMA: Lactic Acid, Venous: 1.4 mmol/L (ref 0.5–1.9)

## 2020-03-05 LAB — APTT: aPTT: 24 seconds (ref 24–36)

## 2020-03-05 MED ORDER — AMLODIPINE BESYLATE 5 MG PO TABS
5.0000 mg | ORAL_TABLET | Freq: Every day | ORAL | Status: DC
  Administered 2020-03-06 – 2020-03-11 (×6): 5 mg via ORAL
  Filled 2020-03-05 (×9): qty 1

## 2020-03-05 MED ORDER — HYDROMORPHONE HCL 1 MG/ML IJ SOLN
1.0000 mg | Freq: Once | INTRAMUSCULAR | Status: AC
  Administered 2020-03-05: 1 mg via INTRAVENOUS
  Filled 2020-03-05: qty 1

## 2020-03-05 MED ORDER — METOPROLOL TARTRATE 100 MG PO TABS
100.0000 mg | ORAL_TABLET | Freq: Two times a day (BID) | ORAL | Status: DC
  Administered 2020-03-05 – 2020-03-11 (×10): 100 mg via ORAL
  Filled 2020-03-05 (×12): qty 1

## 2020-03-05 MED ORDER — METHADONE HCL 5 MG PO TABS
5.0000 mg | ORAL_TABLET | Freq: Every day | ORAL | Status: DC
  Administered 2020-03-05 – 2020-03-11 (×23): 5 mg via ORAL
  Filled 2020-03-05 (×25): qty 1

## 2020-03-05 MED ORDER — VANCOMYCIN HCL IN DEXTROSE 1-5 GM/200ML-% IV SOLN
1000.0000 mg | Freq: Once | INTRAVENOUS | Status: AC
  Administered 2020-03-05: 1000 mg via INTRAVENOUS
  Filled 2020-03-05: qty 200

## 2020-03-05 MED ORDER — ONDANSETRON HCL 4 MG PO TABS: 4.0000 mg | ORAL_TABLET | Freq: Four times a day (QID) | ORAL | Status: DC | PRN

## 2020-03-05 MED ORDER — SODIUM CHLORIDE 0.9% FLUSH
3.0000 mL | Freq: Two times a day (BID) | INTRAVENOUS | Status: DC
  Administered 2020-03-05 – 2020-03-11 (×9): 3 mL via INTRAVENOUS

## 2020-03-05 MED ORDER — PANTOPRAZOLE SODIUM 40 MG PO TBEC
40.0000 mg | DELAYED_RELEASE_TABLET | Freq: Every day | ORAL | Status: DC
  Administered 2020-03-05 – 2020-03-08 (×4): 40 mg via ORAL
  Filled 2020-03-05: qty 1
  Filled 2020-03-05: qty 2
  Filled 2020-03-05 (×3): qty 1

## 2020-03-05 MED ORDER — ACETAMINOPHEN 650 MG RE SUPP
650.0000 mg | Freq: Four times a day (QID) | RECTAL | Status: DC | PRN
  Administered 2020-03-05: 650 mg via RECTAL
  Filled 2020-03-05: qty 1

## 2020-03-05 MED ORDER — SODIUM CHLORIDE 0.9 % IV BOLUS
1000.0000 mL | Freq: Once | INTRAVENOUS | Status: AC
  Administered 2020-03-05: 01:00:00 1000 mL via INTRAVENOUS

## 2020-03-05 MED ORDER — ENOXAPARIN SODIUM 40 MG/0.4ML ~~LOC~~ SOLN
40.0000 mg | SUBCUTANEOUS | Status: AC
  Administered 2020-03-05 – 2020-03-07 (×3): 40 mg via SUBCUTANEOUS
  Filled 2020-03-05 (×5): qty 0.4

## 2020-03-05 MED ORDER — HYPROMELLOSE (GONIOSCOPIC) 2.5 % OP SOLN
1.0000 [drp] | Freq: Four times a day (QID) | OPHTHALMIC | Status: DC | PRN
  Filled 2020-03-05 (×2): qty 15

## 2020-03-05 MED ORDER — DICYCLOMINE HCL 20 MG PO TABS
20.0000 mg | ORAL_TABLET | Freq: Three times a day (TID) | ORAL | Status: DC
  Administered 2020-03-05 – 2020-03-11 (×16): 20 mg via ORAL
  Filled 2020-03-05 (×21): qty 1

## 2020-03-05 MED ORDER — ONDANSETRON HCL 4 MG/2ML IJ SOLN
4.0000 mg | Freq: Four times a day (QID) | INTRAMUSCULAR | Status: DC | PRN
  Administered 2020-03-05 – 2020-03-11 (×16): 4 mg via INTRAVENOUS
  Filled 2020-03-05 (×18): qty 2

## 2020-03-05 MED ORDER — FAMOTIDINE 20 MG PO TABS: 20.0000 mg | ORAL_TABLET | Freq: Every day | ORAL | Status: DC

## 2020-03-05 MED ORDER — VANCOMYCIN HCL 750 MG/150ML IV SOLN
750.0000 mg | INTRAVENOUS | Status: DC
  Administered 2020-03-05: 750 mg via INTRAVENOUS
  Filled 2020-03-05 (×2): qty 150

## 2020-03-05 MED ORDER — ACETAMINOPHEN 500 MG PO TABS
1000.0000 mg | ORAL_TABLET | Freq: Once | ORAL | Status: DC
  Filled 2020-03-05: qty 2

## 2020-03-05 MED ORDER — DIPHENOXYLATE-ATROPINE 2.5-0.025 MG PO TABS: 1.0000 | ORAL_TABLET | Freq: Four times a day (QID) | ORAL | Status: DC | PRN

## 2020-03-05 MED ORDER — TRAZODONE HCL 100 MG PO TABS
200.0000 mg | ORAL_TABLET | Freq: Every evening | ORAL | Status: DC | PRN
  Administered 2020-03-08 – 2020-03-10 (×2): 200 mg via ORAL
  Filled 2020-03-05 (×2): qty 2

## 2020-03-05 MED ORDER — DEXTRAN 70-HYPROMELLOSE 0.1-0.3 % OP SOLN
1.0000 [drp] | Freq: Four times a day (QID) | OPHTHALMIC | Status: DC
  Filled 2020-03-05 (×4): qty 15

## 2020-03-05 MED ORDER — METRONIDAZOLE IN NACL 5-0.79 MG/ML-% IV SOLN
500.0000 mg | Freq: Three times a day (TID) | INTRAVENOUS | Status: DC
  Administered 2020-03-05 – 2020-03-06 (×4): 500 mg via INTRAVENOUS
  Filled 2020-03-05 (×4): qty 100

## 2020-03-05 MED ORDER — ONDANSETRON HCL 4 MG/2ML IJ SOLN
4.0000 mg | Freq: Once | INTRAMUSCULAR | Status: AC
  Administered 2020-03-05: 4 mg via INTRAVENOUS
  Filled 2020-03-05: qty 2

## 2020-03-05 MED ORDER — DULOXETINE HCL 60 MG PO CPEP
90.0000 mg | ORAL_CAPSULE | Freq: Every day | ORAL | Status: DC
  Administered 2020-03-05 – 2020-03-11 (×6): 90 mg via ORAL
  Filled 2020-03-05 (×6): qty 1

## 2020-03-05 MED ORDER — PROMETHAZINE HCL 25 MG RE SUPP
25.0000 mg | Freq: Four times a day (QID) | RECTAL | Status: DC | PRN
  Filled 2020-03-05: qty 1

## 2020-03-05 MED ORDER — METRONIDAZOLE IN NACL 5-0.79 MG/ML-% IV SOLN
500.0000 mg | Freq: Once | INTRAVENOUS | Status: AC
  Administered 2020-03-05: 500 mg via INTRAVENOUS
  Filled 2020-03-05: qty 100

## 2020-03-05 MED ORDER — SUCRALFATE 1 GM/10ML PO SUSP
1.0000 g | Freq: Three times a day (TID) | ORAL | Status: DC
  Administered 2020-03-05 – 2020-03-11 (×19): 1 g via ORAL
  Filled 2020-03-05 (×20): qty 10

## 2020-03-05 MED ORDER — HYDROMORPHONE HCL 2 MG PO TABS
4.0000 mg | ORAL_TABLET | Freq: Four times a day (QID) | ORAL | Status: DC | PRN
  Administered 2020-03-05: 4 mg via ORAL
  Filled 2020-03-05 (×2): qty 2

## 2020-03-05 MED ORDER — CYCLOSPORINE 0.05 % OP EMUL
1.0000 [drp] | Freq: Two times a day (BID) | OPHTHALMIC | Status: DC
  Administered 2020-03-05 – 2020-03-11 (×10): 1 [drp] via OPHTHALMIC
  Filled 2020-03-05 (×14): qty 1

## 2020-03-05 MED ORDER — HYDROMORPHONE HCL 1 MG/ML IJ SOLN
1.0000 mg | INTRAMUSCULAR | Status: DC | PRN
  Administered 2020-03-05 – 2020-03-11 (×28): 1 mg via INTRAVENOUS
  Filled 2020-03-05 (×30): qty 1

## 2020-03-05 MED ORDER — IOHEXOL 300 MG/ML  SOLN
80.0000 mL | Freq: Once | INTRAMUSCULAR | Status: AC | PRN
  Administered 2020-03-05: 80 mL via INTRAVENOUS

## 2020-03-05 MED ORDER — FAMOTIDINE 20 MG PO TABS
20.0000 mg | ORAL_TABLET | Freq: Two times a day (BID) | ORAL | Status: DC
  Administered 2020-03-05 – 2020-03-11 (×10): 20 mg via ORAL
  Filled 2020-03-05 (×10): qty 1

## 2020-03-05 MED ORDER — SODIUM CHLORIDE 0.9 % IV SOLN
1.0000 g | Freq: Three times a day (TID) | INTRAVENOUS | Status: DC
  Administered 2020-03-05 – 2020-03-06 (×4): 1 g via INTRAVENOUS
  Filled 2020-03-05 (×5): qty 1

## 2020-03-05 MED ORDER — ACETAMINOPHEN 325 MG PO TABS: 650.0000 mg | ORAL_TABLET | Freq: Four times a day (QID) | ORAL | Status: DC | PRN

## 2020-03-05 MED ORDER — HYDROMORPHONE HCL 1 MG/ML IJ SOLN: 1.0000 mg | Freq: Four times a day (QID) | INTRAMUSCULAR | Status: DC

## 2020-03-05 MED ORDER — LOPERAMIDE HCL 2 MG PO CAPS
2.0000 mg | ORAL_CAPSULE | ORAL | Status: DC | PRN
  Administered 2020-03-09: 2 mg via ORAL
  Filled 2020-03-05: qty 1

## 2020-03-05 MED ORDER — BUPROPION HCL ER (SR) 100 MG PO TB12
200.0000 mg | ORAL_TABLET | Freq: Every day | ORAL | Status: DC
  Administered 2020-03-06 – 2020-03-11 (×6): 200 mg via ORAL
  Filled 2020-03-05 (×7): qty 2

## 2020-03-05 MED ORDER — ACETAMINOPHEN 650 MG RE SUPP
650.0000 mg | Freq: Once | RECTAL | Status: AC
  Administered 2020-03-05: 650 mg via RECTAL
  Filled 2020-03-05: qty 1

## 2020-03-05 MED ORDER — BUDESONIDE 3 MG PO CPEP
9.0000 mg | ORAL_CAPSULE | Freq: Every day | ORAL | Status: DC
  Administered 2020-03-06 – 2020-03-11 (×6): 9 mg via ORAL
  Filled 2020-03-05 (×7): qty 3

## 2020-03-05 MED ORDER — LACTATED RINGERS IV SOLN: INTRAVENOUS | Status: DC

## 2020-03-05 MED ORDER — SODIUM CHLORIDE 0.9 % IV SOLN
2.0000 g | Freq: Once | INTRAVENOUS | Status: AC
  Administered 2020-03-05: 2 g via INTRAVENOUS
  Filled 2020-03-05: qty 2

## 2020-03-05 MED ORDER — ESTRADIOL 1 MG PO TABS
2.0000 mg | ORAL_TABLET | Freq: Every day | ORAL | Status: DC
  Administered 2020-03-06 – 2020-03-11 (×6): 2 mg via ORAL
  Filled 2020-03-05 (×3): qty 2
  Filled 2020-03-05: qty 1
  Filled 2020-03-05 (×3): qty 2
  Filled 2020-03-05 (×2): qty 1

## 2020-03-05 MED ORDER — SODIUM CHLORIDE 0.9 % IV BOLUS
1000.0000 mL | Freq: Once | INTRAVENOUS | Status: AC
  Administered 2020-03-05: 1000 mL via INTRAVENOUS

## 2020-03-05 NOTE — Progress Notes (Signed)
RN made aware that IV team can't pull patient's tunneled picc line and they will need to contact IR.

## 2020-03-05 NOTE — Progress Notes (Signed)
Pharmacy Antibiotic Note  Caitlin Peters is a 60 y.o. female admitted on 03/04/2020 with fever/chills, possible sepsis.  Pharmacy has been consulted for .vna and Aztreonam dosing.  Vancomycin 1 g IV given in ED at  0230  Plan: Vancomycin 750 mg IV q24h Est AUC 441 Aztreonam 1 g IV q8h  Height: 5' 8"  (172.7 cm) Weight: 63.5 kg (140 lb) IBW/kg (Calculated) : 63.9  Temp (24hrs), Avg:102 F (38.9 C), Min:101.1 F (38.4 C), Max:102.9 F (39.4 C)  Recent Labs  Lab 03/05/20 0048  WBC 1.5*  CREATININE 1.58*  LATICACIDVEN 1.4    Estimated Creatinine Clearance: 38.9 mL/min (A) (by C-G formula based on SCr of 1.58 mg/dL (H)).    Allergies  Allergen Reactions  . Cefepime Other (See Comments)    Neurotoxicity occurring in setting of AKI. Ceftriaxone tolerated during same admit  . Gabapentin Other (See Comments)    unknown  . Hyoscyamine Hives and Swelling    Legs swelling   Disorientation  . Lyrica [Pregabalin] Other (See Comments)    unknown  . Meperidine Hives    Other reaction(s): GI Upset Due to Chrones   . Topamax [Topiramate] Other (See Comments)    unknown  . Fentanyl Rash    Pt is allergic to fentanyl patch related to the glue (gives her a rash) Pt states she is NOT allergic to fentanyl IV medicine  . Morphine And Related Rash    Caryl Pina 03/05/2020 7:35 AM

## 2020-03-05 NOTE — ED Notes (Signed)
Dr. Wyvonnia Dusky made aware of ongoing elevated temperature (101 F) despite tylenol.

## 2020-03-05 NOTE — ED Notes (Addendum)
Pt refuses po Tylenol for her pain/temp, states that she doesn't "do good" with pills. She requests suppository tylenol & something stronger for her pain via IV. MD Lorin Mercy notified.

## 2020-03-05 NOTE — ED Notes (Signed)
CT contacted regarding patient's CT abdomen, CT advised they had not taken patient for scan due to lack of peripheral IV. This RN advised CT patient has PICC line and refused PIV while in ED.

## 2020-03-05 NOTE — Progress Notes (Signed)
PHARMACY - PHYSICIAN COMMUNICATION CRITICAL VALUE ALERT - BLOOD CULTURE IDENTIFICATION (BCID)  Caitlin Peters is an 59 y.o. female who presented to [facility] on 03/04/2020 with a chief complaint of abdominal pain.   Assessment:  Has been having fevers - suspected PIC line infection. 1 set of blood cultures positive for GNR - BCID showing nothing. ID following.  Name of physician (or Provider) Contacted: X. Blount, APP  Current antibiotics: Aztreonam and vancomycin  Changes to prescribed antibiotics recommended:  Will continue to monitor Bcx - will defer to ID, could consider de-escalation from vancomycin pending results.   Results for orders placed or performed during the hospital encounter of 01/02/20  Blood Culture ID Panel (Reflexed) (Collected: 01/02/2020  5:30 AM)  Result Value Ref Range   Enterococcus species NOT DETECTED NOT DETECTED   Listeria monocytogenes NOT DETECTED NOT DETECTED   Staphylococcus species NOT DETECTED NOT DETECTED   Staphylococcus aureus (BCID) NOT DETECTED NOT DETECTED   Streptococcus species NOT DETECTED NOT DETECTED   Streptococcus agalactiae NOT DETECTED NOT DETECTED   Streptococcus pneumoniae NOT DETECTED NOT DETECTED   Streptococcus pyogenes NOT DETECTED NOT DETECTED   Acinetobacter baumannii NOT DETECTED NOT DETECTED   Enterobacteriaceae species DETECTED (A) NOT DETECTED   Enterobacter cloacae complex DETECTED (A) NOT DETECTED   Escherichia coli NOT DETECTED NOT DETECTED   Klebsiella oxytoca NOT DETECTED NOT DETECTED   Klebsiella pneumoniae NOT DETECTED NOT DETECTED   Proteus species NOT DETECTED NOT DETECTED   Serratia marcescens NOT DETECTED NOT DETECTED   Carbapenem resistance NOT DETECTED NOT DETECTED   Haemophilus influenzae NOT DETECTED NOT DETECTED   Neisseria meningitidis NOT DETECTED NOT DETECTED   Pseudomonas aeruginosa NOT DETECTED NOT DETECTED   Candida albicans NOT DETECTED NOT DETECTED   Candida glabrata NOT DETECTED NOT DETECTED    Candida krusei NOT DETECTED NOT DETECTED   Candida parapsilosis NOT DETECTED NOT DETECTED   Candida tropicalis NOT DETECTED NOT DETECTED   Antonietta Jewel, PharmD, BCCCP Clinical Pharmacist  Phone: [phone] 03/05/2020 9:41 PM  Please check AMION for all [facility] Pharmacy phone numbers After 10:00 PM, call [place] [phone]

## 2020-03-05 NOTE — ED Notes (Signed)
Patient transported to CT 

## 2020-03-05 NOTE — Consult Note (Addendum)
[facility]    Date of Admission:  03/04/2020   Total days of antibiotics: 0 vanco/aztreonam               Reason for Consult: Fever    Referring Provider: Lorin Mercy MD   Assessment: Fever sepsis Suspected PIC line infection Multiple previous BSI Short Gut syndrome  Crohn's Disease  Plan: 1. Would continue her current anbx. She is at risk for fungemia as well.  2. She had severe reaction to cephalosporin 3. Low threshold to pull her line (we discussed that we would pull it and she agreed) 4. Await her BCx 5. Suspect her LFTs and leukopenia are due to sepsis. Will watch.   Thank you so much for this interesting consult,  Active Problems:   Sepsis ([place])   . amLODipine  5 mg Oral Daily  . budesonide  9 mg Oral Daily  . buPROPion  200 mg Oral Daily  . cycloSPORINE  1 drop Both Eyes BID  . Dextran 70-Hypromellose  1 drop Both Eyes QID  . dicyclomine  20 mg Oral TID  . DULoxetine  90 mg Oral Daily  . enoxaparin (LOVENOX) injection  40 mg Subcutaneous Q24H  . estradiol  2 mg Oral Daily  . famotidine  20 mg Oral QHS  .  HYDROmorphone (DILAUDID) injection  1 mg Intravenous Once  . methadone  5 mg Oral See admin instructions  . metoprolol tartrate  100 mg Oral BID  . pantoprazole  40 mg Oral Daily  . sodium chloride flush  3 mL Intravenous Q12H  . sucralfate  1 g Oral TID WC & HS    HPI: Caitlin Peters is a 59 y.o. female with hx of Crohn's disease, short gut/home TPN and L femoral PIC line. She was in hospital 01-02-2020 with Enterobacter (R-ancef)  bacteremia related to her PIC.  She comes to ED on 4-9 with temp 101.8 at home as well as headache, myalgias, diffuse abd pain, and n/v. Her CT abd did not show acute change. She was noted to have new leukopenia and increased LFTs, and Cr at baseline.   She was started on vanco/aztreonam.   She states her line has been positional, has had difficulty getting blood out at times.  Placed 12-2019  in [facility] (where pt from).   Review of Systems: Review of Systems  Constitutional: Negative for fever.  HENT: Negative for sore throat.   Respiratory: Negative for cough.   Gastrointestinal: Positive for diarrhea. Negative for abdominal pain.  Genitourinary: Negative for dysuria.  Please see HPI. All other systems reviewed and negative.   Past Medical History:  Diagnosis Date  . Anasarca 10/2019  . AVN (avascular necrosis of bone) ([place])   . Cataract   . Chronic pain syndrome   . CKD (chronic kidney disease), stage III   . Crohn disease ([place])   . Crohn disease ([place])   . Depression   . Diverticulosis   . GERD (gastroesophageal reflux disease)   . HTN (hypertension)   . IDA (iron deficiency anemia)   . Malnutrition ([place])   . Mass in chest   . Osteoporosis   . Pancreatitis   . Short gut syndrome   . Vitamin B12 deficiency     Social History   Tobacco Use  . Smoking status: Former Research scientist (life sciences)  . Smokeless tobacco: Never Used  Substance Use Topics  . Alcohol use: Not Currently  .  Drug use: Never    Family History  Problem Relation Age of Onset  . Breast cancer Sister   . Multiple sclerosis Sister   . Diabetes Sister   . Lupus Sister   . Colon cancer Other   . Crohn's disease Other   . Seizures Mother   . Glaucoma Mother   . CAD Father   . Heart disease Father   . Hypertension Father      Medications:  Scheduled: . amLODipine  5 mg Oral Daily  . budesonide  9 mg Oral Daily  . buPROPion  200 mg Oral Daily  . cycloSPORINE  1 drop Both Eyes BID  . dicyclomine  20 mg Oral TID  . DULoxetine  90 mg Oral Daily  . enoxaparin (LOVENOX) injection  40 mg Subcutaneous Q24H  . estradiol  2 mg Oral Daily  . famotidine  20 mg Oral QHS  . methadone  5 mg Oral 5 X Daily  . metoprolol tartrate  100 mg Oral BID  . pantoprazole  40 mg Oral Daily  . sodium chloride flush  3 mL Intravenous Q12H  . sucralfate  1 g Oral TID WC & HS    Abtx:  Anti-infectives (From  admission, onward)   Start     Dose/Rate Route Frequency Ordered Stop   03/05/20 1130  metroNIDAZOLE (FLAGYL) IVPB 500 mg     500 mg 100 mL/hr over 60 Minutes Intravenous Every 8 hours 03/05/20 1111     03/05/20 1000  aztreonam (AZACTAM) 1 g in sodium chloride 0.9 % 100 mL IVPB     1 g 200 mL/hr over 30 Minutes Intravenous Every 8 hours 03/05/20 0738     03/05/20 1000  vancomycin (VANCOREADY) IVPB 750 mg/150 mL     750 mg 150 mL/hr over 60 Minutes Intravenous Every 24 hours 03/05/20 0738     03/05/20 0045  aztreonam (AZACTAM) 2 g in sodium chloride 0.9 % 100 mL IVPB     2 g 200 mL/hr over 30 Minutes Intravenous  Once 03/05/20 0031 03/05/20 0158   03/05/20 0045  metroNIDAZOLE (FLAGYL) IVPB 500 mg     500 mg 100 mL/hr over 60 Minutes Intravenous  Once 03/05/20 0031 03/05/20 0346   03/05/20 0045  vancomycin (VANCOCIN) IVPB 1000 mg/200 mL premix     1,000 mg 200 mL/hr over 60 Minutes Intravenous  Once 03/05/20 0031 03/05/20 0450        OBJECTIVE: Blood pressure (!) 155/80, pulse 78, temperature (!) 100.8 F (38.2 C), temperature source Oral, resp. rate 19, height 5' 8"  (1.727 m), weight 63.5 kg, SpO2 100 %.  Physical Exam Constitutional:      General: She is not in acute distress.    Appearance: Normal appearance. She is not ill-appearing or toxic-appearing.  HENT:     Mouth/Throat:     Mouth: Mucous membranes are moist.     Pharynx: No oropharyngeal exudate.  Eyes:     Extraocular Movements: Extraocular movements intact.     Pupils: Pupils are equal, round, and reactive to light.  Cardiovascular:     Rate and Rhythm: Normal rate and regular rhythm.  Pulmonary:     Effort: Pulmonary effort is normal.     Breath sounds: Normal breath sounds.  Abdominal:     General: Bowel sounds are normal. There is no distension.     Palpations: Abdomen is soft.     Tenderness: There is no abdominal tenderness.  Musculoskeletal:     Cervical  back: Normal range of motion and neck supple.       Right lower leg: No edema.     Left lower leg: No edema.       Legs:  Neurological:     Mental Status: She is alert.     Lab Results Results for orders placed or performed during the hospital encounter of 03/04/20 (from the past 48 hour(s))  Lactic acid, plasma     Status: None   Collection Time: 03/05/20 12:48 AM  Result Value Ref Range   Lactic Acid, Venous 1.4 0.5 - 1.9 mmol/L    Comment: Performed at [facility], [address] [place], [place] [address]  Comprehensive metabolic panel     Status: Abnormal   Collection Time: 03/05/20 12:48 AM  Result Value Ref Range   Sodium 140 135 - 145 mmol/L   Potassium 3.8 3.5 - 5.1 mmol/L   Chloride 104 98 - 111 mmol/L   CO2 26 22 - 32 mmol/L   Glucose, Bld 79 70 - 99 mg/dL    Comment: Glucose reference range applies only to samples taken after fasting for at least 8 hours.   BUN 39 (H) 6 - 20 mg/dL   Creatinine, Ser 1.58 (H) 0.44 - 1.00 mg/dL   Calcium 9.1 8.9 - 10.3 mg/dL   Total Protein 6.8 6.5 - 8.1 g/dL   Albumin 3.1 (L) 3.5 - 5.0 g/dL   AST 884 (H) 15 - 41 U/L   ALT 607 (H) 0 - 44 U/L   Alkaline Phosphatase 253 (H) 38 - 126 U/L   Total Bilirubin 1.0 0.3 - 1.2 mg/dL   GFR calc non Af Amer 36 (L) >60 mL/min   GFR calc Af Amer 41 (L) >60 mL/min   Anion gap 10 5 - 15    Comment: Performed at [facility] [address], [place], [place] [address]  CBC WITH DIFFERENTIAL     Status: Abnormal   Collection Time: 03/05/20 12:48 AM  Result Value Ref Range   WBC 1.5 (L) 4.0 - 10.5 K/uL   RBC 3.19 (L) 3.87 - 5.11 MIL/uL   Hemoglobin 9.9 (L) 12.0 - 15.0 g/dL   HCT 30.6 (L) 36.0 - 46.0 %   MCV 95.9 80.0 - 100.0 fL   MCH 31.0 26.0 - 34.0 pg   MCHC 32.4 30.0 - 36.0 g/dL   RDW 14.3 11.5 - 15.5 %   Platelets 123 (L) 150 - 400 K/uL   nRBC 0.0 0.0 - 0.2 %   Neutrophils Relative % 87 %   Neutro Abs 1.3 (L) 1.7 - 7.7 K/uL   Lymphocytes Relative 10 %   Lymphs Abs 0.2 (L) 0.7 - 4.0 K/uL   Monocytes Relative 1 %    Monocytes Absolute 0.0 (L) 0.1 - 1.0 K/uL   Eosinophils Relative 1 %   Eosinophils Absolute 0.0 0.0 - 0.5 K/uL   Basophils Relative 0 %   Basophils Absolute 0.0 0.0 - 0.1 K/uL   Immature Granulocytes 1 %   Abs Immature Granulocytes 0.01 0.00 - 0.07 K/uL   Ovalocytes PRESENT     Comment: Performed at [facility], [address], [place],  [address]  APTT     Status: None   Collection Time: 03/05/20 12:48 AM  Result Value Ref Range   aPTT 24 24 - 36 seconds    Comment: Performed at [facility] [address] [place],  [address]  Protime-INR  Status: None   Collection Time: 03/05/20 12:48 AM  Result Value Ref Range   Prothrombin Time 13.4 11.4 - 15.2 seconds   INR 1.0 0.8 - 1.2    Comment: (NOTE) INR goal varies based on device and disease states. Performed at [facility], [place] [address], [place], [place] [address]   CK     Status: None   Collection Time: 03/05/20 12:48 AM  Result Value Ref Range   Total CK 67 38 - 234 U/L    Comment: Performed at [facility], [place] [address], [place], [place] [address]  Respiratory Panel by RT PCR (Flu A&B, Covid) - Nasopharyngeal Swab     Status: None   Collection Time: 03/05/20 12:48 AM   Specimen: Nasopharyngeal Swab  Result Value Ref Range   SARS Coronavirus 2 by RT PCR NEGATIVE NEGATIVE    Comment: (NOTE) SARS-CoV-2 target nucleic acids are NOT DETECTED. The SARS-CoV-2 RNA is generally detectable in upper respiratoy specimens during the acute phase of infection. The lowest concentration of SARS-CoV-2 viral copies this assay can detect is 131 copies/mL. A negative result does not preclude SARS-Cov-2 infection and should not be used as the sole basis for treatment or other patient management decisions. A negative result may occur with  improper specimen collection/handling, submission of specimen other than nasopharyngeal swab, presence of viral mutation(s) within the areas targeted by this  assay, and inadequate number of viral copies (<131 copies/mL). A negative result must be combined with clinical observations, patient history, and epidemiological information. The expected result is Negative. Fact Sheet for Patients:  PinkCheek.be Fact Sheet for Healthcare Providers:  GravelBags.it This test is not yet ap proved or cleared by the [place] FDA and  has been authorized for detection and/or diagnosis of SARS-CoV-2 by FDA under an Emergency Use Authorization (EUA). This EUA will remain  in effect (meaning this test can be used) for the duration of the COVID-19 declaration under Section 564(b)(1) of the Act, 21 U.S.C. section 360bbb-3(b)(1), unless the authorization is terminated or revoked sooner.    Influenza A by PCR NEGATIVE NEGATIVE   Influenza B by PCR NEGATIVE NEGATIVE    Comment: (NOTE) The Xpert Xpress SARS-CoV-2/FLU/RSV assay is intended as an aid in  the diagnosis of influenza from Nasopharyngeal swab specimens and  should not be used as a sole basis for treatment. Nasal washings and  aspirates are unacceptable for Xpert Xpress SARS-CoV-2/FLU/RSV  testing. Fact Sheet for Patients: PinkCheek.be Fact Sheet for Healthcare Providers: GravelBags.it This test is not yet approved or cleared by the [place] FDA and  has been authorized for detection and/or diagnosis of SARS-CoV-2 by  FDA under an Emergency Use Authorization (EUA). This EUA will remain  in effect (meaning this test can be used) for the duration of the  Covid-19 declaration under Section 564(b)(1) of the Act, 21  U.S.C. section 360bbb-3(b)(1), unless the authorization is  terminated or revoked. Performed at [facility], [place] [address], [place], [place] [address]   Urinalysis, Routine w reflex microscopic     Status: Abnormal   Collection Time: 03/05/20  7:53 AM  Result  Value Ref Range   Color, Urine YELLOW YELLOW   APPearance CLEAR CLEAR   Specific Gravity, Urine 1.024 1.005 - 1.030   pH 7.0 5.0 - 8.0   Glucose, UA NEGATIVE NEGATIVE mg/dL   Hgb urine dipstick NEGATIVE NEGATIVE   Bilirubin Urine NEGATIVE NEGATIVE   Ketones, ur NEGATIVE NEGATIVE mg/dL  Protein, ur 100 (A) NEGATIVE mg/dL   Nitrite NEGATIVE NEGATIVE   Leukocytes,Ua NEGATIVE NEGATIVE   RBC / HPF 0-5 0 - 5 RBC/hpf   WBC, UA 0-5 0 - 5 WBC/hpf   Bacteria, UA NONE SEEN NONE SEEN   Squamous Epithelial / LPF 0-5 0 - 5   Mucus PRESENT     Comment: Performed at [facility], [place] [address] [place], [place] [address]      Component Value Date/Time   SDES BLOOD RIGHT HAND 01/04/2020 0647   SPECREQUEST  01/04/2020 9470    BOTTLES DRAWN AEROBIC ONLY Blood Culture results may not be optimal due to an inadequate volume of blood received in culture bottles   CULT  01/04/2020 0647    NO GROWTH 5 DAYS Performed at [facility], [place] [address] [place], [place] [address]    REPTSTATUS 01/09/2020 FINAL 01/04/2020 0647   CT ABDOMEN PELVIS W CONTRAST  Result Date: 03/05/2020 CLINICAL DATA:  Crohn's disease.  Sepsis EXAM: CT ABDOMEN AND PELVIS WITH CONTRAST TECHNIQUE: Multidetector CT imaging of the abdomen and pelvis was performed using the standard protocol following bolus administration of intravenous contrast. CONTRAST:  67m OMNIPAQUE IOHEXOL 300 MG/ML  SOLN COMPARISON:  CT 01/02/2020 FINDINGS: Lower chest: Lung bases are clear. Hepatobiliary: No focal hepatic lesion. Postcholecystectomy. No biliary dilatation. Pancreas: There is moderate dilatation of the pancreatic duct to 5 mm unchanged from prior. Moderate dilatation of the common bile duct to 7 mm. These findings are similar to most recent CT exam of 01/02/2020 and improved from CT of 11/25/2019. Sphincterotomy 11/26/2019 Spleen: Normal spleen Adrenals/urinary tract: Adrenal glands and kidneys are normal. The ureters and bladder  normal. Stomach/Bowel: Stomach is normal. No bowel obstruction. There multiple surgical anastomosis is in the small bowel. No evidence of bowel obstruction or acute inflammation identified. No abscess. Apparent partial RIGHT hemicolectomy. No inflammation of the colon identified. No stricturing or obstruction. Vascular/Lymphatic: Abdominal aorta is normal caliber with atherosclerotic calcification. There is no retroperitoneal or periportal lymphadenopathy. No pelvic lymphadenopathy. LEFT femoral central venous line noted. Reproductive: Post hysterectomy. Other: No free fluid.  Abdominal abscess. Musculoskeletal: No aggressive osseous lesion. IMPRESSION: 1. No acute abdominopelvic process identified. 2. Multiple small-bowel and colon anastomoses without evidence of acute inflammation, stricturing, or bowel obstruction. 3. Dilatation of the common bile duct and pancreatic duct persistent but much improved from CT 11/25/2019 following sphincterotomy. Electronically Signed   By: SSuzy BouchardM.D.   On: 03/05/2020 06:36   DG Chest Port 1 View  Result Date: 03/05/2020 CLINICAL DATA:  Fever. EXAM: PORTABLE CHEST 1 VIEW COMPARISON:  October 28, 2019 FINDINGS: The lungs are hyperinflated. There is no evidence of acute infiltrate, pleural effusion or pneumothorax. Radiopaque surgical clips are seen overlying the suprahilar region on the right and lateral aspect of the right lung base. The heart size and mediastinal contours are within normal limits. The visualized skeletal structures are unremarkable. IMPRESSION: No active disease. Electronically Signed   By: TVirgina NorfolkM.D.   On: 03/05/2020 01:44   Recent Results (from the past 240 hour(s))  Respiratory Panel by RT PCR (Flu A&B, Covid) - Nasopharyngeal Swab     Status: None   Collection Time: 03/05/20 12:48 AM   Specimen: Nasopharyngeal Swab  Result Value Ref Range Status   SARS Coronavirus 2 by RT PCR NEGATIVE NEGATIVE Final    Comment:  (NOTE) SARS-CoV-2 target nucleic acids are NOT DETECTED. The SARS-CoV-2 RNA is generally detectable in upper  respiratoy specimens during the acute phase of infection. The lowest concentration of SARS-CoV-2 viral copies this assay can detect is 131 copies/mL. A negative result does not preclude SARS-Cov-2 infection and should not be used as the sole basis for treatment or other patient management decisions. A negative result may occur with  improper specimen collection/handling, submission of specimen other than nasopharyngeal swab, presence of viral mutation(s) within the areas targeted by this assay, and inadequate number of viral copies (<131 copies/mL). A negative result must be combined with clinical observations, patient history, and epidemiological information. The expected result is Negative. Fact Sheet for Patients:  PinkCheek.be Fact Sheet for Healthcare Providers:  GravelBags.it This test is not yet ap proved or cleared by the [place] FDA and  has been authorized for detection and/or diagnosis of SARS-CoV-2 by FDA under an Emergency Use Authorization (EUA). This EUA will remain  in effect (meaning this test can be used) for the duration of the COVID-19 declaration under Section 564(b)(1) of the Act, 21 U.S.C. section 360bbb-3(b)(1), unless the authorization is terminated or revoked sooner.    Influenza A by PCR NEGATIVE NEGATIVE Final   Influenza B by PCR NEGATIVE NEGATIVE Final    Comment: (NOTE) The Xpert Xpress SARS-CoV-2/FLU/RSV assay is intended as an aid in  the diagnosis of influenza from Nasopharyngeal swab specimens and  should not be used as a sole basis for treatment. Nasal washings and  aspirates are unacceptable for Xpert Xpress SARS-CoV-2/FLU/RSV  testing. Fact Sheet for Patients: PinkCheek.be Fact Sheet for Healthcare  Providers: GravelBags.it This test is not yet approved or cleared by the [place] FDA and  has been authorized for detection and/or diagnosis of SARS-CoV-2 by  FDA under an Emergency Use Authorization (EUA). This EUA will remain  in effect (meaning this test can be used) for the duration of the  Covid-19 declaration under Section 564(b)(1) of the Act, 21  U.S.C. section 360bbb-3(b)(1), unless the authorization is  terminated or revoked. Performed at [facility], [place] [address] [place],  [address]     Microbiology: Recent Results (from the past 240 hour(s))  Respiratory Panel by RT PCR (Flu A&B, Covid) - Nasopharyngeal Swab     Status: None   Collection Time: 03/05/20 12:48 AM   Specimen: Nasopharyngeal Swab  Result Value Ref Range Status   SARS Coronavirus 2 by RT PCR NEGATIVE NEGATIVE Final    Comment: (NOTE) SARS-CoV-2 target nucleic acids are NOT DETECTED. The SARS-CoV-2 RNA is generally detectable in upper respiratoy specimens during the acute phase of infection. The lowest concentration of SARS-CoV-2 viral copies this assay can detect is 131 copies/mL. A negative result does not preclude SARS-Cov-2 infection and should not be used as the sole basis for treatment or other patient management decisions. A negative result may occur with  improper specimen collection/handling, submission of specimen other than nasopharyngeal swab, presence of viral mutation(s) within the areas targeted by this assay, and inadequate number of viral copies (<131 copies/mL). A negative result must be combined with clinical observations, patient history, and epidemiological information. The expected result is Negative. Fact Sheet for Patients:  PinkCheek.be Fact Sheet for Healthcare Providers:  GravelBags.it This test is not yet ap proved or cleared by the [place] FDA and  has been  authorized for detection and/or diagnosis of SARS-CoV-2 by FDA under an Emergency Use Authorization (EUA). This EUA will remain  in effect (meaning this test can be used) for the duration of the COVID-19 declaration under  Section 564(b)(1) of the Act, 21 U.S.C. section 360bbb-3(b)(1), unless the authorization is terminated or revoked sooner.    Influenza A by PCR NEGATIVE NEGATIVE Final   Influenza B by PCR NEGATIVE NEGATIVE Final    Comment: (NOTE) The Xpert Xpress SARS-CoV-2/FLU/RSV assay is intended as an aid in  the diagnosis of influenza from Nasopharyngeal swab specimens and  should not be used as a sole basis for treatment. Nasal washings and  aspirates are unacceptable for Xpert Xpress SARS-CoV-2/FLU/RSV  testing. Fact Sheet for Patients: PinkCheek.be Fact Sheet for Healthcare Providers: GravelBags.it This test is not yet approved or cleared by the [place] FDA and  has been authorized for detection and/or diagnosis of SARS-CoV-2 by  FDA under an Emergency Use Authorization (EUA). This EUA will remain  in effect (meaning this test can be used) for the duration of the  Covid-19 declaration under Section 564(b)(1) of the Act, 21  U.S.C. section 360bbb-3(b)(1), unless the authorization is  terminated or revoked. Performed at [facility], [place] [address] [place], [place] [address]     Radiographs and labs were personally reviewed by me.   Bobby Rumpf, MD [facility] [place] [facility] [phone] 03/05/2020, 11:21 AM

## 2020-03-05 NOTE — ED Notes (Signed)
ED Provider at bedside. 

## 2020-03-05 NOTE — H&P (Signed)
History and Physical    [name] BCW:[id number] DOB: 01-26-1961 DOA: 03/04/2020  PCP: [name], [name], MD Consultants:  Tommy Medal - ID; Jain/Beavers - GI; Royce Macadamia - pain; [place] - colon surgery Stephanie Acre, [place]) Patient coming from:  Home - lives with daughter and 2 grandchildren - she moved here in November to live with family; NOK: Daughter, [phone]  Chief Complaint: Abdominal pain  HPI: Caitlin Peters is a 59 y.o. female with medical history significant of short gut syndrome with malnutrition, on TPN, associated with Crohn's disease and multiple resections; iron deficiency anemia requiring recurrent transfusions; recurrent bacteremia; HTN; stage 3 CKD; and chronic pain syndrome presenting with sepsis.  She was last hospitalized from 2/6-15 with recurrent Enterobacter bacteremia thought to be associated with a line infection (vs. Less likely GI source).  She had exchange on tunneled left CVC by IR on 2/11 and was consulted on by IR, GI, and ID as well as nephrology for acute on chronic kidney disease.  She reports that she was doing very well yesterday.  She started getting fevers and pain.  She took meds like she always does but her daughter called the ambulance because she wasn't doing well.  It is usually either the gut or the line that causes the problem.  She started to have painful diarrhea, was scheduled to see GI on 4/13.  She reports that her ducts are inflamed; they went in and looked at them and he cut a piece to try to straighten it.  She was ok until this week.  She has burning in and out.  She is having 5-6 stools a day, sometimes more if she eats.  She told her pain doctor what was going on and he said to take her pain meds like she has been and to get with GI.  When she is at home, she doesn't always take pain pills but then they don't work during flares.  "And I've got to come in and they put me on my Dilaudid IV and Phenergan.  I don't do anything by mouth because my  gut isn't good at this time."  She had some vomiting last week but none recently.  She does have nausea.  No fevers "until now."  She is also asking for ice chips and sips.    ED Course:  Carryover, per Dr. Marlowe Sax:  History of short gut syndrome, Crohn's on TPN via left leg PICC, history of Enterobacter bacteremia presenting as code sepsis. Febrile and tachypneic. Neutropenic. Lactic acid normal. Covid negative. Chest x-ray negative. CT abdomen pelvis showing chronic stable CBD dilation (history of CBD stone for which she has had sphincterotomy done before). CT showing no acute finding.  Started on aztreonam, vancomycin, and Flagyl.  GI consulted.  Review of Systems: As per HPI; otherwise review of systems reviewed and negative.   Ambulatory Status:  Ambulates with rolling walker, tries to walk at least 2-3 times a day  COVID Vaccine Status:   First shot  Past Medical History:  Diagnosis Date  . Anasarca 10/2019  . AVN (avascular necrosis of bone) ([place])   . Cataract   . Chronic pain syndrome   . CKD (chronic kidney disease), stage III   . Crohn disease ([place])   . Crohn disease ([place])   . Depression   . Diverticulosis   . GERD (gastroesophageal reflux disease)   . HTN (hypertension)   . IDA (iron deficiency anemia)   . Malnutrition ([place])   .  Mass in chest   . Osteoporosis   . Pancreatitis   . Short gut syndrome   . Vitamin B12 deficiency     Past Surgical History:  Procedure Laterality Date  . ABDOMINAL HYSTERECTOMY    . ABDOMINAL SURGERY     colon resection with abdominal stoma, repair of intestinal leak, reversal of abdominal stoma  . BILIARY DILATION  11/26/2019   Procedure: BILIARY DILATION;  Surgeon: Jackquline Denmark, MD;  Location: [facility];  Service: Endoscopy;;  . CHEST WALL RESECTION     right thoracotomy,resection of chest mass with anterior rib and reconstruction using prosthetic mesh and video arthroscopy  . CHOLECYSTECTOMY    . COLONOSCOPY  2019  .  ERCP N/A 11/26/2019   Procedure: ENDOSCOPIC RETROGRADE CHOLANGIOPANCREATOGRAPHY (ERCP);  Surgeon: Jackquline Denmark, MD;  Location: [facility];  Service: Endoscopy;  Laterality: N/A;  . IR FLUORO GUIDE CV LINE LEFT  01/07/2020  . IR PTA VENOUS EXCEPT DIALYSIS CIRCUIT  01/07/2020  . IR US GUIDE VASC ACCESS LEFT     x 2 06/17/19 and 09/14/2019  . KNEE SURGERY     right knee   . REMOVAL OF STONES  11/26/2019   Procedure: REMOVAL OF STONES;  Surgeon: Jackquline Denmark, MD;  Location: [facility];  Service: Endoscopy;;  . SMALL INTESTINE SURGERY     x3  . SPHINCTEROTOMY  11/26/2019   Procedure: SPHINCTEROTOMY;  Surgeon: Jackquline Denmark, MD;  Location: [facility];  Service: Endoscopy;;  . UPPER GASTROINTESTINAL ENDOSCOPY      Social History   Socioeconomic History  . Marital status: Single    Spouse name: Not on file  . Number of children: Not on file  . Years of education: Not on file  . Highest education level: Not on file  Occupational History  . Occupation: disabled  Tobacco Use  . Smoking status: Former Research scientist (life sciences)  . Smokeless tobacco: Never Used  Substance and Sexual Activity  . Alcohol use: Not Currently  . Drug use: Never  . Sexual activity: Not on file  Other Topics Concern  . Not on file  Social History Narrative  . Not on file   Social Determinants of Health   Financial Resource Strain:   . Difficulty of Paying Living Expenses:   Food Insecurity:   . Worried About Charity fundraiser in the Last Year:   . Arboriculturist in the Last Year:   Transportation Needs:   . Film/video editor (Medical):   [name] Kitchen Lack of Transportation (Non-Medical):   Physical Activity:   . Days of Exercise per Week:   . Minutes of Exercise per Session:   Stress:   . Feeling of Stress :   Social Connections:   . Frequency of Communication with Friends and Family:   . Frequency of Social Gatherings with Friends and Family:   . Attends Religious Services:   . Active Member of Clubs or  Organizations:   . Attends Archivist Meetings:   [name] Kitchen Marital Status:   Intimate Partner Violence:   . Fear of Current or Ex-Partner:   . Emotionally Abused:   [name] Kitchen Physically Abused:   . Sexually Abused:     Allergies  Allergen Reactions  . Cefepime Other (See Comments)    Neurotoxicity occurring in setting of AKI. Ceftriaxone tolerated during same admit  . Gabapentin Other (See Comments)    unknown  . Hyoscyamine Hives and Swelling    Legs swelling   Disorientation  . Lyrica [Pregabalin]  Other (See Comments)    unknown  . Meperidine Hives    Other reaction(s): GI Upset Due to [place]   . Topamax [Topiramate] Other (See Comments)    unknown  . Fentanyl Rash    Pt is allergic to fentanyl patch related to the glue (gives her a rash) Pt states she is NOT allergic to fentanyl IV medicine  . Morphine And Related Rash    Family History  Problem Relation Age of Onset  . Breast cancer Sister   . Multiple sclerosis Sister   . Diabetes Sister   . Lupus Sister   . Colon cancer Other   . Crohn's disease Other   . Seizures Mother   . Glaucoma Mother   . CAD Father   . Heart disease Father   . Hypertension Father     Prior to Admission medications   Medication Sig Start Date End Date Taking? Authorizing Provider  acetaminophen (TYLENOL) 325 MG tablet Take 650 mg by mouth every 6 (six) hours as needed for mild pain.     [provider]  amLODipine (NORVASC) 5 MG tablet Take 1 tablet (5 mg total) by mouth daily. 10/16/19   [name], [name], MD  budesonide (ENTOCORT EC) 3 MG 24 hr capsule Take 3 capsules (9 mg total) by mouth daily. 12/15/19 03/14/20  [name], [name], MD  buPROPion (WELLBUTRIN SR) 100 MG 12 hr tablet Take 2 tablets (200 mg total) by mouth daily. 12/15/19 03/14/20  [name], [name], MD  calcium carbonate (OS-CAL) 1250 (500 Ca) MG chewable tablet Chew 1 tablet by mouth 2 (two) times daily.    [provider]    cholecalciferol (VITAMIN D3) 25 MCG (1000 UT) tablet Take 2,000 Units by mouth daily.    [provider]  cycloSPORINE (RESTASIS) 0.05 % ophthalmic emulsion Place 1 drop into both eyes 2 (two) times daily.    [provider]  dexlansoprazole (DEXILANT) 60 MG capsule Take 1 capsule (60 mg total) by mouth daily. 10/16/19   [name], [name], MD  Dextran 70-Hypromellose 0.1-0.3 % SOLN Place 1 drop into both eyes 4 (four) times daily.    [provider]  dicyclomine (BENTYL) 20 MG tablet Take 20 mg by mouth 3 (three) times daily. 10/26/19   [provider]  diphenoxylate-atropine (LOMOTIL) 2.5-0.025 MG tablet Take 1 tablet by mouth 4 (four) times daily as needed for diarrhea or loose stools. 10/31/19   Dessa Phi, DO  DULoxetine (CYMBALTA) 30 MG capsule Take 3 capsules (90 mg total) by mouth daily. 11/01/19   Dessa Phi, DO  estradiol (ESTRACE) 2 MG tablet Take 1 tablet (2 mg total) by mouth daily. 12/10/19   [name], [name], MD  famotidine (PEPCID) 20 MG tablet Take 20 mg by mouth at bedtime.    [provider]  HYDROmorphone (DILAUDID) 4 MG tablet Take 1 tablet (4 mg total) by mouth every 6 (six) hours as needed for severe pain. 12/06/19   Ripley Fraise, MD  lipase/protease/amylase (CREON) 36000 UNITS CPEP capsule Take 3 capsules (108,000 Units total) by mouth 3 (three) times daily before meals. 12/15/19 03/14/20  [name], [name], MD  loperamide (IMODIUM) 2 MG capsule Take 1 capsule (2 mg total) by mouth as needed for diarrhea or loose stools. 02/04/20   [name], [name], MD  methadone (DOLOPHINE) 10 MG tablet Take 1 tablet (10 mg total) by mouth every 8 (eight) hours. 01/11/20   Hongalgi, Lenis Dickinson,  MD  metoprolol tartrate (LOPRESSOR) 100 MG tablet Take 1 tablet (100 mg total) by mouth 2 (two) times daily. 03/03/20   [name], [name], MD  Multiple Vitamins-Minerals (MULTIVITAMIN ADULT PO) Take 1 tablet by  mouth daily.    [provider]  Omega-3 1000 MG CAPS Take 1 capsule by mouth daily.    [provider]  PRESCRIPTION MEDICATION Home TPN [facility] [place]    [provider]  Probiotic Product (PROBIOTIC-10 PO) Take 1 capsule by mouth daily.    [provider]  promethazine (PHENERGAN) 25 MG tablet TAKE 1 TABLET BY MOUTH EVERY 6 HOURS AS NEEDED FOR NAUSEA AND VOMITING 03/03/20   [name], [name], MD  sucralfate (CARAFATE) 1 GM/10ML suspension Take 10 mLs (1 g total) by mouth 4 (four) times daily -  with meals and at bedtime. 12/15/19   [name], [name], MD  traZODone (DESYREL) 100 MG tablet Take 2 tablets (200 mg total) by mouth at bedtime as needed for sleep. 12/31/19   [name], [name], MD  vitamin B-12 (CYANOCOBALAMIN) 100 MCG tablet Take 1,000 mcg by mouth daily.     [provider]    Physical Exam: Vitals:   03/05/20 0815 03/05/20 0816 03/05/20 0830 03/05/20 0918  BP: (!) 163/72  (!) 161/92 (!) 155/80  Pulse: (!) 38  79 78  Resp: (!) 26  20 19   Temp:  (!) 101 F (38.3 C)  (!) 100.8 F (38.2 C)  TempSrc:  Oral  Oral  SpO2: 98%  100% 100%  Weight:      Height:         . General:  Intermittent moaning in pain and at other times very specific in her requests for pain medications and the like . Eyes:   EOMI, normal lids, iris . ENT:  grossly normal hearing, lips & tongue, mmm . Neck:  no LAD, masses or thyromegaly . Cardiovascular:  RRR, no m/r/g. No LE edema.  [name] Kitchen Respiratory:   CTA bilaterally with no wheezes/rales/rhonchi.  Normal to mildly increased respiratory effort. . Abdomen:  Diffusely TTP, normal BS throughout . Skin:  no rash or induration seen on limited exam . Musculoskeletal:  grossly normal tone BUE/BLE, good ROM, no bony abnormality; L femoral central line in place . Psychiatric:  grossly normal mood and affect, speech fluent and appropriate, AOx3, repeated intermittent moaning and  request for pain medication alternating with calm and appropriate demeanor . Neurologic:  CN 2-12 grossly intact, moves all extremities in coordinated fashion    Radiological Exams on Admission: CT ABDOMEN PELVIS W CONTRAST  Result Date: 03/05/2020 CLINICAL DATA:  Crohn's disease.  Sepsis EXAM: CT ABDOMEN AND PELVIS WITH CONTRAST TECHNIQUE: Multidetector CT imaging of the abdomen and pelvis was performed using the standard protocol following bolus administration of intravenous contrast. CONTRAST:  66m OMNIPAQUE IOHEXOL 300 MG/ML  SOLN COMPARISON:  CT 01/02/2020 FINDINGS: Lower chest: Lung bases are clear. Hepatobiliary: No focal hepatic lesion. Postcholecystectomy. No biliary dilatation. Pancreas: There is moderate dilatation of the pancreatic duct to 5 mm unchanged from prior. Moderate dilatation of the common bile duct to 7 mm. These findings are similar to most recent CT exam of 01/02/2020 and improved from CT of 11/25/2019. Sphincterotomy 11/26/2019 Spleen: Normal spleen Adrenals/urinary tract: Adrenal glands and kidneys are normal. The ureters and bladder normal. Stomach/Bowel: Stomach is normal. No bowel obstruction. There multiple surgical anastomosis is in the small bowel. No evidence of bowel obstruction  or acute inflammation identified. No abscess. Apparent partial RIGHT hemicolectomy. No inflammation of the colon identified. No stricturing or obstruction. Vascular/Lymphatic: Abdominal aorta is normal caliber with atherosclerotic calcification. There is no retroperitoneal or periportal lymphadenopathy. No pelvic lymphadenopathy. LEFT femoral central venous line noted. Reproductive: Post hysterectomy. Other: No free fluid.  Abdominal abscess. Musculoskeletal: No aggressive osseous lesion. IMPRESSION: 1. No acute abdominopelvic process identified. 2. Multiple small-bowel and colon anastomoses without evidence of acute inflammation, stricturing, or bowel obstruction. 3. Dilatation of the common bile  duct and pancreatic duct persistent but much improved from CT 11/25/2019 following sphincterotomy. Electronically Signed   By: Suzy Bouchard M.D.   On: 03/05/2020 06:36   DG Chest Port 1 View  Result Date: 03/05/2020 CLINICAL DATA:  Fever. EXAM: PORTABLE CHEST 1 VIEW COMPARISON:  October 28, 2019 FINDINGS: The lungs are hyperinflated. There is no evidence of acute infiltrate, pleural effusion or pneumothorax. Radiopaque surgical clips are seen overlying the suprahilar region on the right and lateral aspect of the right lung base. The heart size and mediastinal contours are within normal limits. The visualized skeletal structures are unremarkable. IMPRESSION: No active disease. Electronically Signed   By: Virgina Norfolk M.D.   On: 03/05/2020 01:44    EKG: Independently reviewed.  NSR with rate 85; nonspecific ST changes with no evidence of acute ischemia; NSCSLT   Labs on Admission: I have personally reviewed the available labs and imaging studies at the time of the admission.  Pertinent labs:   BUN 39/Creatinine 1.58/GFR 36 - stable AP 253; 170 on 2/15 Albumin 3.1 AST 884/ALT 607; 50/77 on 2/15 CK 67 Lactate 1.4 WBC 1.5; 3.9 on 2/15 Hgb 9.9; 7.7 on 2/15 Platelets 123; 236 on 2/15 INR 1.0 Respiratory panel PCR negative UA: 100 protein Blood and urine cultures are pending   Assessment/Plan Principal Problem:   Sepsis ([name]) Active Problems:   Crohn disease ([name])   Short gut syndrome   HTN (hypertension)   Depression   Chronic pain syndrome   On total parenteral nutrition (TPN)   Elevated LFTs   Pancytopenia ([name])   Stage 3b chronic kidney disease    Sepsis physiology -SIRS criteria in this patient includes: Leukopenia, fever, tachycardia, tachypnea  -Patient has no current evidence of acute organ failure at this time -While awaiting blood cultures, this appears to be a preseptic condition. -Sepsis protocol initiated -Suspected source is either GI/Crohn's colitis or  line infection -She previously had Enterobacter bacteremia in 12/2019 -She had left femoral CVC placed in 08/2019 in [place], [place] and uses this line for TPN; exchange of this catheter was performed by IR on 2/11 -Blood and urine cultures pending -Will admit due to: probable bacteremia -Treat with IV Aztreonam/Flagyl/Vanc pending ID consult -ID consultation has been requested -Will add HIV -Will trend lactate to ensure improvement -Will order procalcitonin level.  Antibiotics would not be indicated for PCT <0.1 and probably should not be used for < 0.25.  >0.5 indicates infection and >>0.5 indicates more serious disease.  As the procalcitonin level normalizes, it will be reasonable to consider de-escalation of antibiotic coverage.  Short gut syndrome associated with Crohn's disease, on TPN, with elevated LFTs -She has had multiple resections due to Crohn's and now has short gut syndrome -Her imaging today was actually quite reassuring with no obvious active disease and improving ductal dilation -However, she complains of severe pain and also has significant LFT elevation -Will request GI consult; EUS has been considered in the past -TPN per  pharmacy was ordered, but pharmacy has recommended that we hold this pending blood cultures -Continue budesonide -Continue Bentyl, Lomotil, Imodium for diarrhea and abdominal cramping -Continue Dexilant (formulary substitution for Protonix), Pepcid -Hold pancreatic enzymes and probiotic while NPO -Continue Carafate  HTN -Continue Norvasc, Metoprolol  Chronic pain syndrome -Takes home Methadone and Dilaudid -I have reviewed this patient in the [place] Controlled Substances Reporting System.  She is receiving medications from only one provider and appears to be taking them as prescribed. -She is at increased risk of opioid misuse, diversion, or overdose. -She was given a one-time dose of Dilaudid IV for pain control this AM but will now resume her PO pain  control regimen with Methadone 5 mg 5 times daily (recently changed by her pain doctor, Dr. Royce Macadamia) and prn PO Dilaudid  Depression -Continue Wellbutrin and Trazodone  Pancytopenia -Also with pancytopenia during prior hospitalization, although WBC count is clearly worse at this time -Hgb is actually improved now -Will follow as her presumed sepsis is treated  Stage 3b CKD -Appears to be at/near baseline at this time -Had ATN during her prior hospitalization  -Free water in her TPN was increased following last hospitalization -Anemia has improved since last hospitalization      Note: This patient has been tested and is negative for the novel coronavirus COVID-19.    DVT prophylaxis:  Lovenox for now, but may need to hold if platelets continue to trend down Code Status:  Full - confirmed with patient Family Communication: None present Disposition Plan:  The patient is from: home  Anticipated d/c is to: home  Anticipated d/c date is: unknown currently; will depend on clinical response to treatment  Patient is currently: acutely ill Consults called: GI; ID Admission status: Admit - It is my clinical opinion that admission to [facility] is reasonable and necessary because of the expectation that this patient will require hospital care that crosses at least 2 midnights to treat this condition based on the medical complexity of the problems presented.  Given the aforementioned information, the predictability of an adverse outcome is felt to be significant.    Karmen Bongo MD [facility]   How to contact the [facility] Attending or Consulting provider [place] or covering provider during after hours [place], for this patient?  1. Check the care team in [facility] and look for a) attending/consulting TRH provider listed and b) the [facility] team listed 2. Log into [email or web address] and use [place]'s universal password to access. If you do not have the password, please contact the hospital  operator. 3. Locate the [facility] provider you are looking for under [facility] and page to a number that you can be directly reached. 4. If you still have difficulty reaching the provider, please page the [facility] (Director on Call) for the Hospitalists listed on amion for assistance.   03/05/2020, 1:16 PM

## 2020-03-05 NOTE — Progress Notes (Addendum)
Noted TPN Consult to resume TPN from home  Patient with femoral CVC access but has recent hx of infections from lines. Here now with concern for repeat infection  Discussed with Dr Lorin Mercy. Will hold off on resuming TPN until blood cultures return.  Will f/u in am  Home TPN has been placed under Chart Review>>Media>>Photos>> 4/10 1253  Barth Kirks, PharmD, BCPS, BCCCP Clinical Pharmacist [phone]  Please check AMION for all [place] numbers  03/05/2020 12:21 PM

## 2020-03-05 NOTE — Consult Note (Signed)
Consultation  Referring Provider: Dr Carmon Sails Primary Care Physician:  [name], [name], MD Primary Gastroenterologist:  [facility]  Reason for Consultation: Short gut syndrome, history of Crohn's, chronic TPN, chronic pain, elevated LFTs  HPI: Caitlin Peters is a 59 y.o. female , known to Dr. Tarri Glenn, and seen initially in December 2020.  Patient recently moved to [place] to stay with family.  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  Had numerous surgeries over the years, the last was an exploratory lap with lysis of adhesions in February 2019.  He has a chronic pain syndrome and has been opioid dependent for years, currently on Dilaudid 4 mg every 6 hours at home and methadone She is followed by pain management. She has been TPN dependent since 2003.  [facility] the [facility]. She has had issues with recurrent line sepsis with multiple PICC lines over the years. Was admitted in February 2021 with line sepsis secondary to Enterobacter and had exchange of PICC line at her left lower extremity site. Is maintained on cyclic TPN at home 16 hours/day and eats a regular diet. She says she had been doing very well over the past 6 weeks or so, and had abrupt onset yesterday with fever to 102 at home, chills and developed generalized body pain which is usual for her when she gets infected.  She says she is having a lot of pain all over and that when she has these episodes the oral Dilaudid is not nearly as effective and requires IV Dilaudid.  Work-up since admit with chest x-ray unremarkable. Blood cultures pending, on aztreonam vancomycin and Flagyl CT of the abdomen pelvis today shows chronic stable CBD dilation to 7 mm multiple surgical anastomoses prior right hemicolectomy, no evidence of active Crohn's or active  intra-abdominal inflammatory process.  Labs show WBC 1.5, hemoglobin 9.9, platelets 123 T bili 1.0 alk phos 253/ALT 607/AST 884, albumin 3.1 BUN 39/creatinine 1.58 INR 1.0 Procalcitonin 84/lactate 1.4    Patient has had chronically elevated LFTs secondary to TPN, parameters elevated today over her baseline     Past Medical History:  Diagnosis Date  . Anasarca 10/2019  . AVN (avascular necrosis of bone) ([place])   . Cataract   . Chronic pain syndrome   . CKD (chronic kidney disease), stage III   . Crohn disease ([place])   . Crohn disease ([place])   . Depression   . Diverticulosis   . GERD (gastroesophageal reflux disease)   . HTN (hypertension)   . IDA (iron deficiency anemia)   . Malnutrition ([place])   . Mass in chest   . Osteoporosis   . Pancreatitis   . Short gut syndrome   . Vitamin B12 deficiency     Past Surgical History:  Procedure Laterality Date  . ABDOMINAL HYSTERECTOMY    . ABDOMINAL SURGERY     colon resection with abdominal stoma, repair of intestinal leak, reversal of abdominal stoma  . BILIARY DILATION  11/26/2019   Procedure: BILIARY DILATION;  Surgeon: Jackquline Denmark, MD;  Location: [facility] ENDOS[facility];  Service: Endoscopy;;  . CHEST WALL RESECTION     right thoracotomy,resection of chest mass with anterior rib and reconstruction using prosthetic mesh and video arthroscopy  . CHOLECYSTECTOMY    . COLONOSCOPY  2019  . ERCP N/A 11/26/2019   Procedure: ENDOSCOPIC RETROGRADE CHOLANGIOPANCREATOGRAPHY (ERCP);  Surgeon:  Jackquline Denmark, MD;  Location: [facility];  Service: Endoscopy;  Laterality: N/A;  . IR FLUORO GUIDE CV LINE LEFT  01/07/2020  . IR PTA VENOUS EXCEPT DIALYSIS CIRCUIT  01/07/2020  . IR US GUIDE VASC ACCESS LEFT     x 2 06/17/19 and 09/14/2019  . KNEE SURGERY     right knee   . REMOVAL OF STONES  11/26/2019   Procedure: REMOVAL OF STONES;  Surgeon: Jackquline Denmark, MD;  Location: [facility];  Service: Endoscopy;;  . SMALL INTESTINE SURGERY     x3  .  SPHINCTEROTOMY  11/26/2019   Procedure: SPHINCTEROTOMY;  Surgeon: Jackquline Denmark, MD;  Location: [facility];  Service: Endoscopy;;  . UPPER GASTROINTESTINAL ENDOSCOPY      Prior to Admission medications   Medication Sig Start Date End Date Taking? Authorizing Provider  acetaminophen (TYLENOL) 325 MG tablet Take 650 mg by mouth every 6 (six) hours as needed for mild pain.    Yes [provider]  amLODipine (NORVASC) 5 MG tablet Take 1 tablet (5 mg total) by mouth daily. 10/16/19  Yes [name], [name], MD  budesonide (ENTOCORT EC) 3 MG 24 hr capsule Take 3 capsules (9 mg total) by mouth daily. 12/15/19 03/14/20 Yes Erline Hau, MD  buPROPion [facility] SR) 100 MG 12 hr tablet Take 2 tablets (200 mg total) by mouth daily. 12/15/19 03/14/20 Yes Erline Hau, MD  calcium carbonate (OS-CAL) 1250 (500 Ca) MG chewable tablet Chew 1 tablet by mouth 2 (two) times daily.   Yes [provider]  cholecalciferol (VITAMIN D3) 25 MCG (1000 UT) tablet Take 2,000 Units by mouth daily.   Yes [provider]  cycloSPORINE (RESTASIS) 0.05 % ophthalmic emulsion Place 1 drop into both eyes 2 (two) times daily.   Yes [provider]  dexlansoprazole (DEXILANT) 60 MG capsule Take 1 capsule (60 mg total) by mouth daily. 10/16/19  Yes [name], [name], MD  Dextran 70-Hypromellose 0.1-0.3 % SOLN Place 1 drop into both eyes 4 (four) times daily.   Yes [provider]  dicyclomine (BENTYL) 20 MG tablet Take 20 mg by mouth 3 (three) times daily. 10/26/19  Yes [provider]  diphenoxylate-atropine (LOMOTIL) 2.5-0.025 MG tablet Take 1 tablet by mouth 4 (four) times daily as needed for diarrhea or loose stools. 10/31/19  Yes Dessa Phi, DO  DULoxetine (CYMBALTA) 30 MG capsule Take 3 capsules (90 mg total) by mouth daily. 11/01/19  Yes Dessa Phi, DO  estradiol (ESTRACE) 2 MG tablet Take 1 tablet (2 mg total) by mouth daily.  12/10/19  Yes [name], [name], MD  famotidine (PEPCID) 20 MG tablet Take 20 mg by mouth at bedtime.   Yes [provider]  HYDROmorphone (DILAUDID) 4 MG tablet Take 1 tablet (4 mg total) by mouth every 6 (six) hours as needed for severe pain. 12/06/19  Yes Ripley Fraise, MD  lipase/protease/amylase (CREON) 36000 UNITS CPEP capsule Take 3 capsules (108,000 Units total) by mouth 3 (three) times daily before meals. 12/15/19 03/14/20 Yes Erline Hau, MD  loperamide (IMODIUM) 2 MG capsule Take 1 capsule (2 mg total) by mouth as needed for diarrhea or loose stools. 02/04/20  Yes [name], [name], MD  methadone (DOLOPHINE) 10 MG tablet Take 1 tablet (10 mg total) by mouth every 8 (eight) hours. Patient taking differently: Take 5 mg by mouth See admin instructions. 5 times daily 01/11/20  Yes Hongalgi, Lenis Dickinson, MD  metoprolol  tartrate (LOPRESSOR) 100 MG tablet Take 1 tablet (100 mg total) by mouth 2 (two) times daily. 03/03/20  Yes [name], [name], MD  Multiple Vitamins-Minerals (MULTIVITAMIN ADULT PO) Take 1 tablet by mouth daily.   Yes [provider]  Omega-3 1000 MG CAPS Take 1 capsule by mouth daily.   Yes [provider]  potassium chloride SA (KLOR-CON) 20 MEQ tablet Take 40 mEq by mouth daily.   Yes [provider]  PRESCRIPTION MEDICATION Home TPN [employer] in [place]   Yes [provider]  Probiotic Product (PROBIOTIC-10 PO) Take 1 capsule by mouth daily.   Yes [provider]  promethazine (PHENERGAN) 25 MG tablet TAKE 1 TABLET BY MOUTH EVERY 6 HOURS AS NEEDED FOR NAUSEA AND VOMITING 03/03/20  Yes [name], [name], MD  sucralfate (CARAFATE) 1 GM/10ML suspension Take 10 mLs (1 g total) by mouth 4 (four) times daily -  with meals and at bedtime. 12/15/19  Yes [name], [name], MD  traZODone (DESYREL) 100 MG tablet Take 2 tablets (200 mg total) by mouth at bedtime as needed for  sleep. 12/31/19  Yes [name], [name], MD  vitamin B-12 (CYANOCOBALAMIN) 100 MCG tablet Take 1,000 mcg by mouth daily.    Yes [provider]    Current Facility-Administered Medications  Medication Dose Route Frequency Provider Last Rate Last Admin  . acetaminophen (TYLENOL) tablet 650 mg  650 mg Oral Q6H PRN Karmen Bongo, MD       Or  . acetaminophen (TYLENOL) suppository 650 mg  650 mg Rectal Q6H PRN Karmen Bongo, MD      . amLODipine (NORVASC) tablet 5 mg  5 mg Oral Daily Karmen Bongo, MD      . aztreonam (AZACTAM) 1 g in sodium chloride 0.9 % 100 mL IVPB  1 g Intravenous Lynne Logan, MD 200 mL/hr at 03/05/20 1215 1 g at 03/05/20 1215  . budesonide (ENTOCORT EC) 24 hr capsule 9 mg  9 mg Oral Daily Karmen Bongo, MD      . buPROPion [facility] SR) 12 hr tablet 200 mg  200 mg Oral Daily Karmen Bongo, MD      . cycloSPORINE (RESTASIS) 0.05 % ophthalmic emulsion 1 drop  1 drop Both Eyes BID Karmen Bongo, MD   1 drop at 03/05/20 1224  . dicyclomine (BENTYL) tablet 20 mg  20 mg Oral TID Karmen Bongo, MD      . diphenoxylate-atropine (LOMOTIL) 2.5-0.025 MG per tablet 1 tablet  1 tablet Oral QID PRN Karmen Bongo, MD      . DULoxetine (CYMBALTA) DR capsule 90 mg  90 mg Oral Daily Karmen Bongo, MD      . enoxaparin (LOVENOX) injection 40 mg  40 mg Subcutaneous Q24H Karmen Bongo, MD   40 mg at 03/05/20 1223  . estradiol (ESTRACE) tablet 2 mg  2 mg Oral Daily Karmen Bongo, MD      . famotidine (PEPCID) tablet 20 mg  20 mg Oral QHS Karmen Bongo, MD      . HYDROmorphone (DILAUDID) tablet 4 mg  4 mg Oral Q6H PRN Karmen Bongo, MD   4 mg at 03/05/20 1635  . hydroxypropyl methylcellulose / hypromellose (ISOPTO TEARS / GONIOVISC) 2.5 % ophthalmic solution 1 drop  1 drop Both Eyes QID PRN Karmen Bongo, MD      . lactated ringers infusion   Intravenous Continuous Karmen Bongo, MD 100 mL/hr at 03/05/20 1601 New Bag at 03/05/20 1601  .  loperamide (IMODIUM) capsule 2 mg  2 mg Oral PRN Karmen Bongo, MD      . methadone (DOLOPHINE) tablet 5 mg  5 mg Oral 5 X Daily Karmen Bongo, MD      . metoprolol tartrate (LOPRESSOR) tablet 100 mg  100 mg Oral BID Karmen Bongo, MD      . metroNIDAZOLE (FLAGYL) IVPB 500 mg  500 mg Intravenous Lynne Logan, MD 100 mL/hr at 03/05/20 1542 500 mg at 03/05/20 1542  . ondansetron (ZOFRAN) tablet 4 mg  4 mg Oral Q6H PRN Karmen Bongo, MD       Or  . ondansetron [facility]) injection 4 mg  4 mg Intravenous Q6H PRN Karmen Bongo, MD   4 mg at 03/05/20 1209  . pantoprazole (PROTONIX) EC tablet 40 mg  40 mg Oral Daily Karmen Bongo, MD      . promethazine (PHENERGAN) suppository 25 mg  25 mg Rectal Q6H PRN Karmen Bongo, MD      . sodium chloride flush (NS) 0.9 % injection 3 mL  3 mL Intravenous Q12H Karmen Bongo, MD   3 mL at 03/05/20 1225  . sucralfate (CARAFATE) 1 GM/10ML suspension 1 g  1 g Oral TID WC & HS Karmen Bongo, MD      . traZODone (DESYREL) tablet 200 mg  200 mg Oral QHS PRN Karmen Bongo, MD      . vancomycin [facility]) IVPB 750 mg/150 mL  750 mg Intravenous Q24H Karmen Bongo, MD 150 mL/hr at 03/05/20 1221 750 mg at 03/05/20 1221    Allergies as of 03/04/2020 - Review Complete 03/04/2020  Allergen Reaction Noted  . Cefepime Other (See Comments) 11/27/2017  . Gabapentin Other (See Comments) 10/13/2019  . Hyoscyamine Hives and Swelling 07/15/2014  . Lyrica [pregabalin] Other (See Comments) 10/13/2019  . Meperidine Hives 08/14/2011  . Topamax [topiramate] Other (See Comments) 10/13/2019  . Fentanyl Rash 10/12/2019  . Morphine and related Rash 10/12/2019    Family History  Problem Relation Age of Onset  . Breast cancer Sister   . Multiple sclerosis Sister   . Diabetes Sister   . Lupus Sister   . Colon cancer Other   . Crohn's disease Other   . Seizures Mother   . Glaucoma Mother   . CAD Father   . Heart disease Father   . Hypertension Father       Social History   Socioeconomic History  . Marital status: Single    Spouse name: Not on file  . Number of children: Not on file  . Years of education: Not on file  . Highest education level: Not on file  Occupational History  . Occupation: disabled  Tobacco Use  . Smoking status: Former Research scientist (life sciences)  . Smokeless tobacco: Never Used  Substance and Sexual Activity  . Alcohol use: Not Currently  . Drug use: Never  . Sexual activity: Not on file  Other Topics Concern  . Not on file  Social History Narrative  . Not on file   Social Determinants of Health   Financial Resource Strain:   . Difficulty of Paying Living Expenses:   Food Insecurity:   . Worried About Charity fundraiser in the Last Year:   . Arboriculturist in the Last Year:   Transportation Needs:   . Film/video editor (Medical):   [name] Kitchen Lack of Transportation (Non-Medical):   Physical Activity:   . Days of Exercise per Week:   . Minutes of Exercise per  Session:   Stress:   . Feeling of Stress :   Social Connections:   . Frequency of Communication with Friends and Family:   . Frequency of Social Gatherings with Friends and Family:   . Attends Religious Services:   . Active Member of Clubs or Organizations:   . Attends Archivist Meetings:   [name] Kitchen Marital Status:   Intimate Partner Violence:   . Fear of Current or Ex-Partner:   . Emotionally Abused:   [name] Kitchen Physically Abused:   . Sexually Abused:     Review of Systems: Pertinent positive and negative review of systems were noted in the above HPI section.  All other review of systems was otherwise negative.  Physical Exam: Vital signs in last 24 hours: Temp:  [99.6 F (37.6 C)-102.9 F (39.4 C)] 100.9 F (38.3 C) (04/10 1644) Pulse Rate:  [38-90] 80 (04/10 1644) Resp:  [15-28] 19 (04/10 1644) BP: (132-168)/(61-92) 158/65 (04/10 1644) SpO2:  [95 %-100 %] 99 % (04/10 1644) Weight:  [63.5 kg] 63.5 kg (04/09 2347) Last BM Date: 03/04/20 General:    Alert,  Well-developed, well-nourished, African-American female pleasant and cooperative in NAD Head:  Normocephalic and atraumatic. Eyes:  Sclera clear, no icterus.   Conjunctiva pink. Ears:  Normal auditory acuity. Nose:  No deformity, discharge,  or lesions. Mouth:  No deformity or lesions.   Neck:  Supple; no masses or thyromegaly. Lungs:  Clear throughout to auscultation.   No wheezes, crackles, or rhonchi. Heart:  Regular rate and rhythm; no murmurs, clicks, rubs,  or gallops. Abdomen:  Soft, there is some tenderness across the upper abdomen, no guarding BS active,nonpalp mass or hsm.   Rectal:  Deferred  Msk:  Symmetrical without gross deformities. . Pulses:  Normal pulses noted. Extremities:  Without clubbing or edema.  PICC line in the left upper thigh Neurologic:  Alert and  oriented x4;  grossly normal neurologically. Skin:  Intact without significant lesions or rashes.. Psych:  Alert and cooperative. Normal mood and affect.  Intake/Output from previous day: 04/09 0701 - 04/10 0700 In: 2200 [IV Piggyback:2200] Out: -  Intake/Output this shift: Total I/O In: -  Out: 700 [Urine:700]  Lab Results: Recent Labs    03/05/20 0048  WBC 1.5*  HGB 9.9*  HCT 30.6*  PLT 123*   BMET Recent Labs    03/05/20 0048  NA 140  K 3.8  CL 104  CO2 26  GLUCOSE 79  BUN 39*  CREATININE 1.58*  CALCIUM 9.1   LFT Recent Labs    03/05/20 0048  PROT 6.8  ALBUMIN 3.1*  AST 884*  ALT 607*  ALKPHOS 253*  BILITOT 1.0   PT/INR Recent Labs    03/05/20 0048  LABPROT 13.4  INR 1.0   Hepatitis Panel No results for input(s): HEPBSAG, HCVAB, HEPAIGM, HEPBIGM in the last 72 hours.        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 She has been TPN dependent since 2003 Admitted with fever and generalized pain onset yesterday and consistent with multiple prior  episodes of line sepsis  Thus far work-up negative for intra-abdominal source for sepsis. Suspect recurrent PICC line sepsis  #2 chronic pain syndrome-on chronic oral Dilaudid and methadone at home followed by pain management. Pain management currently inadequate and generally requires IV Dilaudid with episodes of sepsis and generalized pain  #3 elevated LFTs-chronic secondary to  chronic TPN, however parameters significantly elevated on admit over her baseline.  This may be secondary to sepsis  #4 chronic kidney disease stage III #5 hypertension  Plan; PICC line needs to be removed, and Placed in the right lower extremity.  Resume TPN, once new line place d Await blood cultures Start clear liquids and advance as tolerated I think it is reasonable to give her IV Dilaudid for a few days if needed for pain control given she is been opioid dependent for multiple years. Will stop oral Dilaudid for now and have ordered Dilaudid 1 mg every 4 hours as needed Continue methadone Follow LFTs     Anajulia Leyendecker EsterwoodPA-C  03/05/2020, 5:20 PM

## 2020-03-05 NOTE — ED Provider Notes (Signed)
[facility] EMERGENCY DEPARTMENT Provider Note   CSN: [id number] Arrival date & time: 03/04/20  2344     History Chief Complaint  Patient presents with  . Fever    Caitlin Peters is a 59 y.o. female.  Patient with history of Crohn's disease, short gut syndrome with TPN.  PICC line in her left leg.  With a 1 day history of body aches, chills, nausea, vomiting and fever to 103.  States symptoms started this morning and she has been sore and achy all day with headache and body ache and several episodes of nausea and vomiting.  She has diffuse abdominal pain on the left side that feels similar to her Crohn's disease.  States she has pasty diarrhea stools which are unchanged.  With several episodes of vomiting today.  Some pain with urination but no blood in the urine.  Denies any cough, chest pain or shortness of breath.  Has a headache and diffuse body aches.  She took Tylenol at home but is unsure what time  The history is provided by the patient and the EMS personnel.       Past Medical History:  Diagnosis Date  . Anasarca 10/2019  . AVN (avascular necrosis of bone) ([place])   . Cataract   . Chronic pain syndrome   . CKD (chronic kidney disease), stage III   . Crohn disease ([place])   . Crohn disease ([place])   . Depression   . Diverticulosis   . GERD (gastroesophageal reflux disease)   . HTN (hypertension)   . IDA (iron deficiency anemia)   . Malnutrition ([place])   . Mass in chest   . Osteoporosis   . Pancreatitis   . Short gut syndrome   . Vitamin B12 deficiency     Patient Active Problem List   Diagnosis Date Noted  . Gram-negative bacteremia   . Infection of peripherally inserted central venous catheter (PICC)   . On total parenteral nutrition (TPN) 01/02/2020  . Elevated liver enzymes 01/02/2020  . Cholangitis   . Generalized abdominal pain   . Intractable abdominal pain 11/25/2019  . Anasarca 10/28/2019  . Acute kidney injury superimposed on chronic  kidney disease ([place]) 10/28/2019  . Falls 10/28/2019  . Crohn disease ([place])   . Malnutrition ([place])   . Short gut syndrome   . HTN (hypertension)   . Vitamin B12 deficiency   . Osteoporosis   . Depression   . GERD (gastroesophageal reflux disease)   . Chronic pain syndrome   . Hypokalemia due to excessive gastrointestinal loss of potassium 10/13/2019  . Diarrhea   . Fever   . Hypokalemia 10/12/2019  . Chronic diarrhea 10/12/2019  . Dysuria 10/12/2019  . Bilateral lower extremity edema 10/12/2019  . Crohn's colitis ([place]) 10/12/2019  . Anemia 10/12/2019  . Hypertension 10/12/2019  . AKI (acute kidney injury) ([place]) 10/12/2019    Past Surgical History:  Procedure Laterality Date  . ABDOMINAL HYSTERECTOMY    . ABDOMINAL SURGERY     colon resection with abdominal stoma, repair of intestinal leak, reversal of abdominal stoma  . BILIARY DILATION  11/26/2019   Procedure: BILIARY DILATION;  Surgeon: Jackquline Denmark, MD;  Location: [facility] ENDOSCOPY;  Service: Endoscopy;;  . CHEST WALL RESECTION     right thoracotomy,resection of chest mass with anterior rib and reconstruction using prosthetic mesh and video arthroscopy  . CHOLECYSTECTOMY    . COLONOSCOPY  2019  . ERCP N/A 11/26/2019   Procedure: ENDOSCOPIC RETROGRADE  CHOLANGIOPANCREATOGRAPHY (ERCP);  Surgeon: Jackquline Denmark, MD;  Location: [facility];  Service: Endoscopy;  Laterality: N/A;  . IR FLUORO GUIDE CV LINE LEFT  01/07/2020  . IR PTA VENOUS EXCEPT DIALYSIS CIRCUIT  01/07/2020  . IR US GUIDE VASC ACCESS LEFT     x 2 06/17/19 and 09/14/2019  . KNEE SURGERY     right knee   . REMOVAL OF STONES  11/26/2019   Procedure: REMOVAL OF STONES;  Surgeon: Jackquline Denmark, MD;  Location: [facility] ENDOSCOPY;  Service: Endoscopy;;  . SMALL INTESTINE SURGERY     x3  . SPHINCTEROTOMY  11/26/2019   Procedure: SPHINCTEROTOMY;  Surgeon: Jackquline Denmark, MD;  Location: [facility];  Service: Endoscopy;;  . UPPER GASTROINTESTINAL ENDOSCOPY       OB History    No obstetric history on file.     Family History  Problem Relation Age of Onset  . Breast cancer Sister   . Multiple sclerosis Sister   . Diabetes Sister   . Lupus Sister   . Colon cancer Other   . Crohn's disease Other   . Seizures Mother   . Glaucoma Mother   . CAD Father   . Heart disease Father   . Hypertension Father     Social History   Tobacco Use  . Smoking status: Former Research scientist (life sciences)  . Smokeless tobacco: Never Used  Substance Use Topics  . Alcohol use: Not Currently  . Drug use: Never    Home Medications Prior to Admission medications   Medication Sig Start Date End Date Taking? Authorizing Provider  acetaminophen (TYLENOL) 325 MG tablet Take 650 mg by mouth every 6 (six) hours as needed for mild pain.     [provider]  amLODipine (NORVASC) 5 MG tablet Take 1 tablet (5 mg total) by mouth daily. 10/16/19   [name], [name], MD  budesonide (ENTOCORT EC) 3 MG 24 hr capsule Take 3 capsules (9 mg total) by mouth daily. 12/15/19 03/14/20  [name], [name], MD  buPROPion (WELLBUTRIN SR) 100 MG 12 hr tablet Take 2 tablets (200 mg total) by mouth daily. 12/15/19 03/14/20  [name], [name], MD  calcium carbonate (OS-CAL) 1250 (500 Ca) MG chewable tablet Chew 1 tablet by mouth 2 (two) times daily.    [provider]  cholecalciferol (VITAMIN D3) 25 MCG (1000 UT) tablet Take 2,000 Units by mouth daily.    [provider]  cycloSPORINE (RESTASIS) 0.05 % ophthalmic emulsion Place 1 drop into both eyes 2 (two) times daily.    [provider]  dexlansoprazole (DEXILANT) 60 MG capsule Take 1 capsule (60 mg total) by mouth daily. 10/16/19   [name], [name], MD  Dextran 70-Hypromellose 0.1-0.3 % SOLN Place 1 drop into both eyes 4 (four) times daily.    [provider]  dicyclomine (BENTYL) 20 MG tablet Take 20 mg by mouth 3 (three) times daily. 10/26/19   [provider]  diphenoxylate-atropine  (LOMOTIL) 2.5-0.025 MG tablet Take 1 tablet by mouth 4 (four) times daily as needed for diarrhea or loose stools. 10/31/19   Dessa Phi, DO  DULoxetine (CYMBALTA) 30 MG capsule Take 3 capsules (90 mg total) by mouth daily. 11/01/19   Dessa Phi, DO  estradiol (ESTRACE) 2 MG tablet Take 1 tablet (2 mg total) by mouth daily. 12/10/19   [name], [name], MD  famotidine (PEPCID) 20 MG tablet Take 20 mg by mouth at bedtime.    [provider]  HYDROmorphone (DILAUDID) 4 MG tablet Take 1 tablet (4 mg total) by mouth every 6 (six) hours as needed for severe pain. 12/06/19   Ripley Fraise, MD  lipase/protease/amylase (CREON) 36000 UNITS CPEP capsule Take 3 capsules (108,000 Units total) by mouth 3 (three) times daily before meals. 12/15/19 03/14/20  [name], [name], MD  loperamide (IMODIUM) 2 MG capsule Take 1 capsule (2 mg total) by mouth as needed for diarrhea or loose stools. 02/04/20   [name], [name], MD  methadone (DOLOPHINE) 10 MG tablet Take 1 tablet (10 mg total) by mouth every 8 (eight) hours. 01/11/20   Hongalgi, Lenis Dickinson, MD  metoprolol tartrate (LOPRESSOR) 100 MG tablet Take 1 tablet (100 mg total) by mouth 2 (two) times daily. 03/03/20   [name], [name], MD  Multiple Vitamins-Minerals (MULTIVITAMIN ADULT PO) Take 1 tablet by mouth daily.    [provider]  Omega-3 1000 MG CAPS Take 1 capsule by mouth daily.    [provider]  PRESCRIPTION MEDICATION Home TPN [facility] [place]    [provider]  Probiotic Product (PROBIOTIC-10 PO) Take 1 capsule by mouth daily.    [provider]  promethazine (PHENERGAN) 25 MG tablet TAKE 1 TABLET BY MOUTH EVERY 6 HOURS AS NEEDED FOR NAUSEA AND VOMITING 03/03/20   [name], [name], MD  sucralfate (CARAFATE) 1 GM/10ML suspension Take 10 mLs (1 g total) by mouth 4 (four) times daily -  with meals and at bedtime. 12/15/19   [name], [name], MD    traZODone (DESYREL) 100 MG tablet Take 2 tablets (200 mg total) by mouth at bedtime as needed for sleep. 12/31/19   [name], [name], MD  vitamin B-12 (CYANOCOBALAMIN) 100 MCG tablet Take 1,000 mcg by mouth daily.     [provider]    Allergies    Cefepime, Gabapentin, Hyoscyamine, Lyrica [pregabalin], Meperidine, Topamax [topiramate], Fentanyl, and Morphine and related  Review of Systems   Review of Systems  Constitutional: Positive for activity change, appetite change, chills, fatigue and fever.  HENT: Negative for congestion and rhinorrhea.   Respiratory: Negative for cough, chest tightness and shortness of breath.   Cardiovascular: Negative for chest pain and leg swelling.  Gastrointestinal: Positive for abdominal pain, nausea and vomiting.  Genitourinary: Positive for dysuria.  Musculoskeletal: Positive for arthralgias and myalgias.  Skin: Negative for rash.  Neurological: Positive for weakness and headaches.    all other systems are negative except as noted in the HPI and PMH.   Physical Exam Updated Vital Signs BP (!) 156/76 (BP Location: Right Arm)   Pulse 82   Temp (!) 102.9 F (39.4 C) (Oral)   Resp 18   Ht 5' 8"  (1.727 m)   Wt 63.5 kg   SpO2 100%   BMI 21.29 kg/m   Physical Exam Vitals and nursing note reviewed.  Constitutional:      General: She is not in acute distress.    Appearance: She is well-developed. She is ill-appearing.     Comments: Uncomfortable, chronically ill appearing  HENT:     Head: Normocephalic and atraumatic.     Mouth/Throat:     Pharynx: No oropharyngeal exudate.  Eyes:     Conjunctiva/sclera: Conjunctivae normal.     Pupils: Pupils are equal, round, and reactive to light.  Neck:     Comments: No meningismus. Cardiovascular:     Rate and Rhythm: Normal rate and regular rhythm.  Heart sounds: Normal heart sounds. No murmur.  Pulmonary:     Effort: Pulmonary effort is normal. No respiratory distress.      Breath sounds: Normal breath sounds.  Abdominal:     Palpations: Abdomen is soft.     Tenderness: There is abdominal tenderness. There is guarding. There is no rebound.     Comments: Distended abdomen, diffusely tender with voluntary guarding  Musculoskeletal:        General: No tenderness. Normal range of motion.     Cervical back: Normal range of motion and neck supple.     Comments: PICC line left thigh appears clean  Skin:    General: Skin is warm.  Neurological:     Mental Status: She is alert and oriented to person, place, and time.     Cranial Nerves: No cranial nerve deficit.     Motor: No abnormal muscle tone.     Coordination: Coordination normal.     Comments:  5/5 strength throughout. CN 2-12 intact.Equal grip strength.   Psychiatric:        Behavior: Behavior normal.     ED Results / Procedures / Treatments   Labs (all labs ordered are listed, but only abnormal results are displayed) Labs Reviewed  COMPREHENSIVE METABOLIC PANEL - Abnormal; Notable for the following components:      Result Value   BUN 39 (*)    Creatinine, Ser 1.58 (*)    Albumin 3.1 (*)    AST 884 (*)    ALT 607 (*)    Alkaline Phosphatase 253 (*)    GFR calc non Af Amer 36 (*)    GFR calc Af Amer 41 (*)    All other components within normal limits  CBC WITH DIFFERENTIAL/PLATELET - Abnormal; Notable for the following components:   WBC 1.5 (*)    RBC 3.19 (*)    Hemoglobin 9.9 (*)    HCT 30.6 (*)    Platelets 123 (*)    Neutro Abs 1.3 (*)    Lymphs Abs 0.2 (*)    Monocytes Absolute 0.0 (*)    All other components within normal limits  RESPIRATORY PANEL BY RT PCR (FLU A&B, COVID)  CULTURE, BLOOD (ROUTINE X 2)  CULTURE, BLOOD (ROUTINE X 2)  URINE CULTURE  LACTIC ACID, PLASMA  APTT  PROTIME-INR  CK  URINALYSIS, ROUTINE W REFLEX MICROSCOPIC  I-STAT BETA HCG BLOOD, ED ([facility], [facility], [facility] ONLY)    EKG EKG Interpretation  Date/Time:  Saturday March 05 2020 00:58:29 EDT Ventricular Rate:   85 PR Interval:    QRS Duration: 86 QT Interval:  348 QTC Calculation: 414 R Axis:   61 Text Interpretation: Sinus arrhythmia Consider left ventricular hypertrophy No significant change was found Confirmed by Ezequiel Essex [id number]) on 03/05/2020 1:16:31 AM   Radiology CT ABDOMEN PELVIS W CONTRAST  Result Date: 03/05/2020 CLINICAL DATA:  Crohn's disease.  Sepsis EXAM: CT ABDOMEN AND PELVIS WITH CONTRAST TECHNIQUE: Multidetector CT imaging of the abdomen and pelvis was performed using the standard protocol following bolus administration of intravenous contrast. CONTRAST:  45m OMNIPAQUE IOHEXOL 300 MG/ML  SOLN COMPARISON:  CT 01/02/2020 FINDINGS: Lower chest: Lung bases are clear. Hepatobiliary: No focal hepatic lesion. Postcholecystectomy. No biliary dilatation. Pancreas: There is moderate dilatation of the pancreatic duct to 5 mm unchanged from prior. Moderate dilatation of the common bile duct to 7 mm. These findings are similar to most recent CT exam of 01/02/2020 and improved from CT of 11/25/2019. Sphincterotomy 11/26/2019  Spleen: Normal spleen Adrenals/urinary tract: Adrenal glands and kidneys are normal. The ureters and bladder normal. Stomach/Bowel: Stomach is normal. No bowel obstruction. There multiple surgical anastomosis is in the small bowel. No evidence of bowel obstruction or acute inflammation identified. No abscess. Apparent partial RIGHT hemicolectomy. No inflammation of the colon identified. No stricturing or obstruction. Vascular/Lymphatic: Abdominal aorta is normal caliber with atherosclerotic calcification. There is no retroperitoneal or periportal lymphadenopathy. No pelvic lymphadenopathy. LEFT femoral central venous line noted. Reproductive: Post hysterectomy. Other: No free fluid.  Abdominal abscess. Musculoskeletal: No aggressive osseous lesion. IMPRESSION: 1. No acute abdominopelvic process identified. 2. Multiple small-bowel and colon anastomoses without evidence of acute  inflammation, stricturing, or bowel obstruction. 3. Dilatation of the common bile duct and pancreatic duct persistent but much improved from CT 11/25/2019 following sphincterotomy. Electronically Signed   By: Suzy Bouchard M.D.   On: 03/05/2020 06:36   DG Chest Port 1 View  Result Date: 03/05/2020 CLINICAL DATA:  Fever. EXAM: PORTABLE CHEST 1 VIEW COMPARISON:  October 28, 2019 FINDINGS: The lungs are hyperinflated. There is no evidence of acute infiltrate, pleural effusion or pneumothorax. Radiopaque surgical clips are seen overlying the suprahilar region on the right and lateral aspect of the right lung base. The heart size and mediastinal contours are within normal limits. The visualized skeletal structures are unremarkable. IMPRESSION: No active disease. Electronically Signed   By: Virgina Norfolk M.D.   On: 03/05/2020 01:44    Procedures .Critical Care Performed by: Ezequiel Essex, MD Authorized by: Ezequiel Essex, MD   Critical care provider statement:    Critical care time (minutes):  45   Critical care was necessary to treat or prevent imminent or life-threatening deterioration of the following conditions:  Sepsis   Critical care was time spent personally by me on the following activities:  Discussions with consultants, evaluation of patient's response to treatment, examination of patient, ordering and performing treatments and interventions, ordering and review of laboratory studies, ordering and review of radiographic studies, pulse oximetry, re-evaluation of patient's condition, obtaining history from patient or surrogate and review of old charts   (including critical care time)  Medications Ordered in ED Medications - No data to display  ED Course  I have reviewed the triage vital signs and the nursing notes.  Pertinent labs & imaging results that were available during my care of the patient were reviewed by me and considered in my medical decision making (see chart for  details).    MDM Rules/Calculators/A&P                      Patient with indwelling catheter here with body aches, chills, fever, nausea, vomiting abdominal pain.  She is febrile and tachycardic on arrival.  Code sepsis was activated.  Patient given broad-spectrum antibiotics after cultures obtained.  Start IV fluid resuscitation.  Patient admitted in February with Enterobacter bacteremia-treated with ertapenem.  She reports an allergy to cefepime.  Lactate is normal.  Patient found to have worsening transaminitis.  Creatinine is stable. Leukopenia of 1.5. [place] 1305.  Blood pressure mental status remained stable in the ED.  She is given broad-spectrum antibiotics and IV fluids.  Culture sent.  CT scan obtained shows no acute pathology.  Does have CBD dilation looks to be improved from previous. Will likely need GI evaluation this admission in setting of elevated LFTs and CBD dilation.  Her source of infection may be her PICC line. Admission discussed with Dr. Marlowe Sax.  Caitlin Peters was evaluated in Emergency Department on 03/05/2020 for the symptoms described in the history of present illness. She was evaluated in the context of the global COVID-19 pandemic, which necessitated consideration that the patient might be at risk for infection with the SARS-CoV-2 virus that causes COVID-19. Institutional protocols and algorithms that pertain to the evaluation of patients at risk for COVID-19 are in a state of rapid change based on information released by regulatory bodies including the CDC and federal and state organizations. These policies and algorithms were followed during the patient's care in the ED.   Final Clinical Impression(s) / ED Diagnoses Final diagnoses:  Sepsis with encephalopathy without septic shock, due to unspecified organism [facility])    Rx / [place] Orders ED Discharge Orders    None       Leila Schuff, Annie Main, MD 03/05/20 [phone]

## 2020-03-06 ENCOUNTER — Inpatient Hospital Stay (HOSPITAL_COMMUNITY): Payer: Medicare Other

## 2020-03-06 DIAGNOSIS — I10 Essential (primary) hypertension: Secondary | ICD-10-CM | POA: Diagnosis not present

## 2020-03-06 DIAGNOSIS — G894 Chronic pain syndrome: Secondary | ICD-10-CM | POA: Diagnosis not present

## 2020-03-06 DIAGNOSIS — R519 Headache, unspecified: Secondary | ICD-10-CM

## 2020-03-06 DIAGNOSIS — M549 Dorsalgia, unspecified: Secondary | ICD-10-CM

## 2020-03-06 DIAGNOSIS — R7881 Bacteremia: Secondary | ICD-10-CM

## 2020-03-06 DIAGNOSIS — R109 Unspecified abdominal pain: Secondary | ICD-10-CM

## 2020-03-06 LAB — DIFFERENTIAL
Abs Immature Granulocytes: 0.03 10*3/uL (ref 0.00–0.07)
Basophils Absolute: 0 10*3/uL (ref 0.0–0.1)
Basophils Relative: 0 %
Eosinophils Absolute: 0 10*3/uL (ref 0.0–0.5)
Eosinophils Relative: 1 %
Immature Granulocytes: 1 %
Lymphocytes Relative: 10 %
Lymphs Abs: 0.6 10*3/uL — ABNORMAL LOW (ref 0.7–4.0)
Monocytes Absolute: 0.3 10*3/uL (ref 0.1–1.0)
Monocytes Relative: 5 %
Neutro Abs: 4.9 10*3/uL (ref 1.7–7.7)
Neutrophils Relative %: 83 %

## 2020-03-06 LAB — PHOSPHORUS: Phosphorus: 2.6 mg/dL (ref 2.5–4.6)

## 2020-03-06 LAB — CBC
HCT: 29.6 % — ABNORMAL LOW (ref 36.0–46.0)
Hemoglobin: 9.4 g/dL — ABNORMAL LOW (ref 12.0–15.0)
MCH: 29.9 pg (ref 26.0–34.0)
MCHC: 31.8 g/dL (ref 30.0–36.0)
MCV: 94.3 fL (ref 80.0–100.0)
Platelets: 97 10*3/uL — ABNORMAL LOW (ref 150–400)
RBC: 3.14 MIL/uL — ABNORMAL LOW (ref 3.87–5.11)
RDW: 14.6 % (ref 11.5–15.5)
WBC: 5.9 10*3/uL (ref 4.0–10.5)
nRBC: 0 % (ref 0.0–0.2)

## 2020-03-06 LAB — RAPID URINE DRUG SCREEN, HOSP PERFORMED
Amphetamines: NOT DETECTED
Barbiturates: NOT DETECTED
Benzodiazepines: NOT DETECTED
Cocaine: NOT DETECTED
Opiates: POSITIVE — AB
Tetrahydrocannabinol: NOT DETECTED

## 2020-03-06 LAB — TRIGLYCERIDES: Triglycerides: 310 mg/dL — ABNORMAL HIGH (ref <150)

## 2020-03-06 LAB — COMPREHENSIVE METABOLIC PANEL
ALT: 912 U/L — ABNORMAL HIGH (ref 0–44)
AST: 556 U/L — ABNORMAL HIGH (ref 15–41)
Albumin: 2.6 g/dL — ABNORMAL LOW (ref 3.5–5.0)
Alkaline Phosphatase: 285 U/L — ABNORMAL HIGH (ref 38–126)
Anion gap: 9 (ref 5–15)
BUN: 26 mg/dL — ABNORMAL HIGH (ref 6–20)
CO2: 20 mmol/L — ABNORMAL LOW (ref 22–32)
Calcium: 8.9 mg/dL (ref 8.9–10.3)
Chloride: 109 mmol/L (ref 98–111)
Creatinine, Ser: 1.41 mg/dL — ABNORMAL HIGH (ref 0.44–1.00)
GFR calc Af Amer: 47 mL/min — ABNORMAL LOW (ref >60)
GFR calc non Af Amer: 41 mL/min — ABNORMAL LOW (ref >60)
Glucose, Bld: 68 mg/dL — ABNORMAL LOW (ref 70–99)
Potassium: 4 mmol/L (ref 3.5–5.1)
Sodium: 138 mmol/L (ref 135–145)
Total Bilirubin: 2.4 mg/dL — ABNORMAL HIGH (ref 0.3–1.2)
Total Protein: 6.4 g/dL — ABNORMAL LOW (ref 6.5–8.1)

## 2020-03-06 LAB — PREALBUMIN: Prealbumin: 22.2 mg/dL (ref 18–38)

## 2020-03-06 LAB — MAGNESIUM: Magnesium: 1.9 mg/dL (ref 1.7–2.4)

## 2020-03-06 MED ORDER — SULFAMETHOXAZOLE-TRIMETHOPRIM 400-80 MG/5ML IV SOLN
320.0000 mg | Freq: Three times a day (TID) | INTRAVENOUS | Status: DC
  Administered 2020-03-06 – 2020-03-08 (×6): 320 mg via INTRAVENOUS
  Filled 2020-03-06 (×9): qty 20

## 2020-03-06 MED ORDER — LORAZEPAM 2 MG/ML IJ SOLN
1.0000 mg | INTRAMUSCULAR | Status: DC | PRN
  Administered 2020-03-06: 1 mg via INTRAVENOUS
  Filled 2020-03-06: qty 1

## 2020-03-06 MED ORDER — DEXTROSE IN LACTATED RINGERS 5 % IV SOLN: INTRAVENOUS | Status: DC

## 2020-03-06 MED ORDER — DOCUSATE SODIUM 100 MG PO CAPS
100.0000 mg | ORAL_CAPSULE | Freq: Every day | ORAL | Status: DC
  Administered 2020-03-06 – 2020-03-07 (×2): 100 mg via ORAL
  Filled 2020-03-06 (×3): qty 1

## 2020-03-06 NOTE — Progress Notes (Signed)
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  Assessment & Plan:   Principal Problem:   Sepsis ([place]) Active Problems:   Crohn disease ([place])   Short gut syndrome   HTN (hypertension)   Depression   Chronic pain syndrome   On total parenteral nutrition (TPN)   Elevated LFTs   Pancytopenia ([name])   Stage 3b chronic kidney disease  Sepsis physiology -Pt presented with Leukopenia, fever, tachycardia, tachypnea  -Suspected source is either GI/Crohn's colitis or line infection -Pt was started on IV Aztreonam/Flagyl/Vanc. ID consulted -Blood cx pos for stenotrophomonas -Per ID, d/c above abx and transition to IV bactrim. 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 Lorin Mercy reviewed this patient in the [place] Controlled Substances Reporting System.  She is receiving medications from only one provider and appears to be taking them as prescribed. -Continue with analgesics as needed  Depression -Continue Wellbutrin and Trazodone  Pancytopenia -WBC improved, however plts trending down to <100k -Possible resultant to septic  picture  Stage 3b CKD -Appears to be at/near baseline at this time -Had ATN during her prior hospitalization  -repeat bmet in AM   DVT prophylaxis: Lovenox subq Code Status: Full Family Communication: Pt in room, family not at bedside  Status is: Inpatient  Remains inpatient appropriate because:IV treatments appropriate due to intensity of illness or inability to take PO   Dispo: The patient is from: Home              Anticipated d/c is to: Home              Anticipated d/c date is: > 3 days              Patient currently is not medically stable to d/c.        Consultants:   Infectious disease  GI  Procedures:     Antimicrobials: Anti-infectives (From admission, onward)   Start     Dose/Rate Route Frequency Ordered Stop   03/06/20 1400  sulfamethoxazole-trimethoprim (BACTRIM) 320 mg in dextrose 5 % 500 mL IVPB     320 mg 346.7 mL/hr over 90 Minutes Intravenous Every 8 hours 03/06/20 1351     03/05/20 1130  metroNIDAZOLE (FLAGYL) IVPB 500 mg  Status:  Discontinued     500 mg 100 mL/hr over 60 Minutes Intravenous Every 8 hours 03/05/20 1111 03/06/20 1351   03/05/20 1000  aztreonam (AZACTAM) 1 g in sodium chloride 0.9 % 100 mL IVPB  Status:  Discontinued     1 g 200 mL/hr over 30 Minutes Intravenous Every 8 hours 03/05/20 0738 03/06/20 1351   03/05/20 1000  vancomycin (VANCOREADY) IVPB 750 mg/150 mL  Status:  Discontinued  750 mg 150 mL/hr over 60 Minutes Intravenous Every 24 hours 03/05/20 0738 03/06/20 1102   03/05/20 0045  aztreonam (AZACTAM) 2 g in sodium chloride 0.9 % 100 mL IVPB     2 g 200 mL/hr over 30 Minutes Intravenous  Once 03/05/20 0031 03/05/20 0158   03/05/20 0045  metroNIDAZOLE (FLAGYL) IVPB 500 mg     500 mg 100 mL/hr over 60 Minutes Intravenous  Once 03/05/20 0031 03/05/20 0346   03/05/20 0045  vancomycin (VANCOCIN) IVPB 1000 mg/200 mL premix     1,000 mg 200 mL/hr over 60 Minutes Intravenous  Once 03/05/20 0031 03/05/20 0450        Subjective: Complaining of continued mid-abdominal discomfort  Objective: Vitals:   03/05/20 1644 03/05/20 2015 03/05/20 2023 03/06/20 0854  BP: (!) 158/65  136/66 (!) 170/92  Pulse: 80  67 (!) 53  Resp: 19 15 12 19   Temp: (!) 100.9 F (38.3 C)  98.6 F (37 C) 98.1 F (36.7 C)  TempSrc: Oral  Oral   SpO2: 99%  99% 100%  Weight:      Height:        Intake/Output Summary (Last 24 hours) at 03/06/2020 1353 Last data filed at 03/05/2020 2025 Gross per 24 hour  Intake 377.15 ml  Output 800 ml  Net -422.85 ml   Filed Weights   03/04/20 2347 03/05/20 1223  Weight: 63.5 kg 66.9 kg    Examination:  General exam: Appears calm and comfortable  Respiratory system: Clear to auscultation. Respiratory effort normal. Cardiovascular system: S1 & S2 heard, Regular Gastrointestinal system: decreased BS, generally tender Central nervous system: Alert and oriented. No focal neurological deficits. Extremities: Symmetric 5 x 5 power. Skin: No rashes, lesions Psychiatry: Judgement and insight appear normal. Mood & affect appropriate.   Data Reviewed: I have personally reviewed following labs and imaging studies  CBC: Recent Labs  Lab 03/05/20 0048 03/06/20 0623  WBC 1.5* 5.9  NEUTROABS 1.3* 4.9  HGB 9.9* 9.4*  HCT 30.6* 29.6*  MCV 95.9 94.3  PLT 123* 97*   Basic Metabolic Panel: Recent Labs  Lab 03/05/20 0048 03/06/20 0623  NA 140 138  K 3.8 4.0  CL 104 109  CO2 26 20*  GLUCOSE 79 68*  BUN 39* 26*  CREATININE 1.58* 1.41*  CALCIUM 9.1 8.9  MG  --  1.9  PHOS  --  2.6   GFR: Estimated Creatinine Clearance: 43.9 mL/min (A) (by C-G formula based on SCr of 1.41 mg/dL (H)). Liver Function Tests: Recent Labs  Lab 03/05/20 0048 03/06/20 0623  AST 884* 556*  ALT 607* 912*  ALKPHOS 253* 285*  BILITOT 1.0 2.4*  PROT 6.8 6.4*  ALBUMIN 3.1* 2.6*   No results for input(s): LIPASE, AMYLASE in the last 168 hours. No results for input(s): AMMONIA in the last 168  hours. Coagulation Profile: Recent Labs  Lab 03/05/20 0048  INR 1.0   Cardiac Enzymes: Recent Labs  Lab 03/05/20 0048  CKTOTAL 67   BNP (last 3 results) No results for input(s): PROBNP in the last 8760 hours. HbA1C: No results for input(s): HGBA1C in the last 72 hours. CBG: No results for input(s): GLUCAP in the last 168 hours. Lipid Profile: Recent Labs    03/06/20 0623  TRIG 310*   Thyroid Function Tests: No results for input(s): TSH, T4TOTAL, FREET4, T3FREE, THYROIDAB in the last 72 hours. Anemia Panel: No results for input(s): VITAMINB12, FOLATE, FERRITIN, TIBC, IRON, RETICCTPCT in the last 72 hours. Sepsis  Labs: Recent Labs  Lab 03/05/20 0048 03/05/20 1405  PROCALCITON  --  84.79  LATICACIDVEN 1.4  --     Recent Results (from the past 240 hour(s))  Respiratory Panel by RT PCR (Flu A&B, Covid) - Nasopharyngeal Swab     Status: None   Collection Time: 03/05/20 12:48 AM   Specimen: Nasopharyngeal Swab  Result Value Ref Range Status   SARS Coronavirus 2 by RT PCR NEGATIVE NEGATIVE Final    Comment: (NOTE) SARS-CoV-2 target nucleic acids are NOT DETECTED. The SARS-CoV-2 RNA is generally detectable in upper respiratoy specimens during the acute phase of infection. The lowest concentration of SARS-CoV-2 viral copies this assay can detect is 131 copies/mL. A negative result does not preclude SARS-Cov-2 infection and should not be used as the sole basis for treatment or other patient management decisions. A negative result may occur with  improper specimen collection/handling, submission of specimen other than nasopharyngeal swab, presence of viral mutation(s) within the areas targeted by this assay, and inadequate number of viral copies (<131 copies/mL). A negative result must be combined with clinical observations, patient history, and epidemiological information. The expected result is Negative. Fact Sheet for Patients:   PinkCheek.be Fact Sheet for Healthcare Providers:  GravelBags.it This test is not yet ap proved or cleared by the [place] FDA and  has been authorized for detection and/or diagnosis of SARS-CoV-2 by FDA under an Emergency Use Authorization (EUA). This EUA will remain  in effect (meaning this test can be used) for the duration of the COVID-19 declaration under Section 564(b)(1) of the Act, 21 U.S.C. section 360bbb-3(b)(1), unless the authorization is terminated or revoked sooner.    Influenza A by PCR NEGATIVE NEGATIVE Final   Influenza B by PCR NEGATIVE NEGATIVE Final    Comment: (NOTE) The Xpert Xpress SARS-CoV-2/FLU/RSV assay is intended as an aid in  the diagnosis of influenza from Nasopharyngeal swab specimens and  should not be used as a sole basis for treatment. Nasal washings and  aspirates are unacceptable for Xpert Xpress SARS-CoV-2/FLU/RSV  testing. Fact Sheet for Patients: PinkCheek.be Fact Sheet for Healthcare Providers: GravelBags.it This test is not yet approved or cleared by the [place] FDA and  has been authorized for detection and/or diagnosis of SARS-CoV-2 by  FDA under an Emergency Use Authorization (EUA). This EUA will remain  in effect (meaning this test can be used) for the duration of the  Covid-19 declaration under Section 564(b)(1) of the Act, 21  U.S.C. section 360bbb-3(b)(1), unless the authorization is  terminated or revoked. Performed at [facility], [place] [address] [place], [place] [address]   Blood Culture (routine x 2)     Status: None (Preliminary result)   Collection Time: 03/05/20 12:51 AM   Specimen: BLOOD  Result Value Ref Range Status   Specimen Description BLOOD RIGHT WRIST  Final   Special Requests   Final    BOTTLES DRAWN AEROBIC AND ANAEROBIC Blood Culture results may not be optimal due to an inadequate  volume of blood received in culture bottles   Culture   Final    NO GROWTH 1 DAY Performed at [facility], [place] [address], [place], [place] [address]    Report Status PENDING  Incomplete  Blood Culture (routine x 2)     Status: Abnormal (Preliminary result)   Collection Time: 03/05/20  1:06 AM   Specimen: BLOOD  Result Value Ref Range Status   Specimen Description BLOOD LEFT FEMORAL ARTERY  Final   Special Requests   Final    BOTTLES DRAWN AEROBIC AND ANAEROBIC Blood Culture results may not be optimal due to an excessive volume of blood received in culture bottles   Culture  Setup Time   Final    GRAM NEGATIVE RODS IN BOTH AEROBIC AND ANAEROBIC BOTTLES CRITICAL RESULT CALLED TO, READ BACK BY AND VERIFIED WITH: PHARMD K HURTH [id number] AT 1726 BY CM    Culture (A)  Final    STENOTROPHOMONAS MALTOPHILIA SUSCEPTIBILITIES TO FOLLOW Performed at [facility], [place] [address] [place], [place] [address]    Report Status PENDING  Incomplete  Blood Culture ID Panel (Reflexed)     Status: None   Collection Time: 03/05/20  1:06 AM  Result Value Ref Range Status   Enterococcus species NOT DETECTED NOT DETECTED Final   Listeria monocytogenes NOT DETECTED NOT DETECTED Final   Staphylococcus species NOT DETECTED NOT DETECTED Final   Staphylococcus aureus (BCID) NOT DETECTED NOT DETECTED Final   Streptococcus species NOT DETECTED NOT DETECTED Final   Streptococcus agalactiae NOT DETECTED NOT DETECTED Final   Streptococcus pneumoniae NOT DETECTED NOT DETECTED Final   Streptococcus pyogenes NOT DETECTED NOT DETECTED Final   Acinetobacter baumannii NOT DETECTED NOT DETECTED Final   Enterobacteriaceae species NOT DETECTED NOT DETECTED Final   Enterobacter cloacae complex NOT DETECTED NOT DETECTED Final   Escherichia coli NOT DETECTED NOT DETECTED Final   Klebsiella oxytoca NOT DETECTED NOT DETECTED Final   Klebsiella pneumoniae NOT DETECTED NOT DETECTED Final   Proteus species NOT  DETECTED NOT DETECTED Final   Serratia marcescens NOT DETECTED NOT DETECTED Final   Haemophilus influenzae NOT DETECTED NOT DETECTED Final   Neisseria meningitidis NOT DETECTED NOT DETECTED Final   Pseudomonas aeruginosa NOT DETECTED NOT DETECTED Final   Candida albicans NOT DETECTED NOT DETECTED Final   Candida glabrata NOT DETECTED NOT DETECTED Final   Candida krusei NOT DETECTED NOT DETECTED Final   Candida parapsilosis NOT DETECTED NOT DETECTED Final   Candida tropicalis NOT DETECTED NOT DETECTED Final    Comment: Performed at [facility], [place] [address], [place], [place] [address]  Urine culture     Status: Abnormal (Preliminary result)   Collection Time: 03/05/20  8:05 AM   Specimen: In/Out Cath Urine  Result Value Ref Range Status   Specimen Description IN/OUT CATH URINE  Final   Special Requests NONE  Final   Culture (A)  Final    30,000 COLONIES/mL STAPHYLOCOCCUS EPIDERMIDIS SUSCEPTIBILITIES TO FOLLOW Performed at [facility], [place] [address], [place],  [address]    Report Status PENDING  Incomplete     Radiology Studies: CT ABDOMEN PELVIS W CONTRAST  Result Date: 03/05/2020 CLINICAL DATA:  Crohn's disease.  Sepsis EXAM: CT ABDOMEN AND PELVIS WITH CONTRAST TECHNIQUE: Multidetector CT imaging of the abdomen and pelvis was performed using the standard protocol following bolus administration of intravenous contrast. CONTRAST:  58m OMNIPAQUE IOHEXOL 300 MG/ML  SOLN COMPARISON:  CT 01/02/2020 FINDINGS: Lower chest: Lung bases are clear. Hepatobiliary: No focal hepatic lesion. Postcholecystectomy. No biliary dilatation. Pancreas: There is moderate dilatation of the pancreatic duct to 5 mm unchanged from prior. Moderate dilatation of the common bile duct to 7 mm. These findings are similar to most recent CT exam of 01/02/2020 and improved from CT of 11/25/2019. Sphincterotomy 11/26/2019 Spleen: Normal spleen Adrenals/urinary tract: Adrenal glands and kidneys are  normal. The ureters and bladder normal. Stomach/Bowel: Stomach is normal. No  bowel obstruction. There multiple surgical anastomosis is in the small bowel. No evidence of bowel obstruction or acute inflammation identified. No abscess. Apparent partial RIGHT hemicolectomy. No inflammation of the colon identified. No stricturing or obstruction. Vascular/Lymphatic: Abdominal aorta is normal caliber with atherosclerotic calcification. There is no retroperitoneal or periportal lymphadenopathy. No pelvic lymphadenopathy. LEFT femoral central venous line noted. Reproductive: Post hysterectomy. Other: No free fluid.  Abdominal abscess. Musculoskeletal: No aggressive osseous lesion. IMPRESSION: 1. No acute abdominopelvic process identified. 2. Multiple small-bowel and colon anastomoses without evidence of acute inflammation, stricturing, or bowel obstruction. 3. Dilatation of the common bile duct and pancreatic duct persistent but much improved from CT 11/25/2019 following sphincterotomy. Electronically Signed   By: Suzy Bouchard M.D.   On: 03/05/2020 06:36   DG Chest Port 1 View  Result Date: 03/05/2020 CLINICAL DATA:  Fever. EXAM: PORTABLE CHEST 1 VIEW COMPARISON:  October 28, 2019 FINDINGS: The lungs are hyperinflated. There is no evidence of acute infiltrate, pleural effusion or pneumothorax. Radiopaque surgical clips are seen overlying the suprahilar region on the right and lateral aspect of the right lung base. The heart size and mediastinal contours are within normal limits. The visualized skeletal structures are unremarkable. IMPRESSION: No active disease. Electronically Signed   By: Virgina Norfolk M.D.   On: 03/05/2020 01:44    Scheduled Meds: . amLODipine  5 mg Oral Daily  . budesonide  9 mg Oral Daily  . buPROPion  200 mg Oral Daily  . cycloSPORINE  1 drop Both Eyes BID  . dicyclomine  20 mg Oral TID  . docusate sodium  100 mg Oral Daily  . DULoxetine  90 mg Oral Daily  . enoxaparin (LOVENOX)  injection  40 mg Subcutaneous Q24H  . estradiol  2 mg Oral Daily  . famotidine  20 mg Oral BID  . methadone  5 mg Oral 5 X Daily  . metoprolol tartrate  100 mg Oral BID  . pantoprazole  40 mg Oral Daily  . sodium chloride flush  3 mL Intravenous Q12H  . sucralfate  1 g Oral TID WC & HS   Continuous Infusions: . dextrose 5% lactated ringers 100 mL/hr at 03/06/20 1003  . sulfamethoxazole-trimethoprim       LOS: 1 day   Marylu Lund, MD [facility] Pager On Amion  If 7PM-7AM, please contact night-coverage 03/06/2020, 1:53 PM

## 2020-03-06 NOTE — Progress Notes (Signed)
Pharmacy Antibiotic Note  Caitlin Peters is a 59 y.o. female admitted on 03/04/2020 with stenotrophomonas bacteremia.  Pharmacy has been consulted for IV Bactrim dosing.  Blood cultures growing stenotrophomonas in one of two sets. WBC wnl, afebrile, PCT 84, LA 1.4.  Spoke with Dr. Johnnye Sima, ok to DC aztreonam and metronidazole and stare IV Bactrim. Patient has CKD, renal function improved since admit, ClCr ~40 ml/min, K wnl. Will watch renal fx closely.   Plan: Start IV Bactrim 77m/kg Q8 hr  Monitor cultures, clinical status, renal fx, potassium level  F/u end date   Height: 5' 8"  (172.7 cm) Weight: 66.9 kg (147 lb 7.8 oz) IBW/kg (Calculated) : 63.9  Temp (24hrs), Avg:99.3 F (37.4 C), Min:98.1 F (36.7 C), Max:100.9 F (38.3 C)  Recent Labs  Lab 03/05/20 0048 03/06/20 0623  WBC 1.5* 5.9  CREATININE 1.58* 1.41*  LATICACIDVEN 1.4  --     Estimated Creatinine Clearance: 43.9 mL/min (A) (by C-G formula based on SCr of 1.41 mg/dL (H)).    Antimicrobials this admission: TMP/SMX 4/11>  Aztreonam 4/10 >>4/11 Vanc  4/10 >>4/11  MTZ 4/10 >>4/11  Microbiology results: 4/10 Bcx (1/2) >> stenotrophomonas  4/10 Ucx >> 30k Staph epidermidis  Thank you for allowing pharmacy to be a part of this patient's care.  LBenetta Spar PharmD, BCPS, BCCP Clinical Pharmacist  Please check AMION for all [place] numbers After 10:00 PM, call [phone]

## 2020-03-06 NOTE — Progress Notes (Signed)
PHARMACY - TOTAL PARENTERAL NUTRITION CONSULT NOTE   Indication: Short bowel syndrome  Patient Measurements: Height: 5' 8"  (172.7 cm) Weight: 66.9 kg (147 lb 7.8 oz) IBW/kg (Calculated) : 63.9 TPN AdjBW (KG): 63.5 Body mass index is 22.43 kg/m.  Assessment:  7 yof with history of complex Crohn's disease on TPN PTA for short gut syndrome presenting 01/02/20 with abdominal pain and diarrhea. Patient was admitted in February with bacteremia. Line was replaced and discharged on IV antibiotics.   Patient returns 4/10 with concern for recurrent infection with fevers. TPN has been held on admit with concern for bacteremia. Blood cx have returned positive for Gram Neg Rods, now pending IR to remove femoral PICC and replace  Glucose / Insulin: < 100s - D5LR added to prevent hypoglycemia  Electrolytes: WNL Renal: SCr 1.41 BUN 26 LFTs / TGs: LFTs elevated. AST 884>556, ALT 607>912 Tbili 1 > 2.4 Trigs 310 Prealbumin / albumin: pre 22.2, alb 2.6 Intake / Output; MIVF: UOP 0.5 mL/kg/hr GI Imaging: CT abd on admit - neg Surgeries / Procedures:   Central access: pending PICC exchange  TPN start date: PTA  Nutritional Goals (per RD recommendation on 2/8): Kcal:  1950-2150 Protein:  95-110 grams Fluid:  > 1.9 L  Home TPN - 2L cycled over 16 hours Protein 100 gm, Dex 300 gm, Lipids 40 g  Current Nutrition:  CLD  Plan:  Continue to hold TPN pending PICC exchange TPN Labs will continue F/U Mon for ability to resume  Barth Kirks, PharmD, BCPS, BCCCP Clinical Pharmacist [phone]  Please check AMION for all [place] numbers  03/06/2020 9:36 AM

## 2020-03-06 NOTE — Progress Notes (Signed)
Progress Note   Subjective  Patient feels better however LAEs going up. She does have chronic abdominal discomfort, does endorse some upper abdominal discomfort. Waiting to have PICC line pulled.    Objective   Vital signs in last 24 hours: Temp:  [98.1 F (36.7 C)-100.9 F (38.3 C)] 98.1 F (36.7 C) (04/11 0854) Pulse Rate:  [53-80] 53 (04/11 0854) Resp:  [12-19] 19 (04/11 0854) BP: (136-170)/(65-92) 170/92 (04/11 0854) SpO2:  [99 %-100 %] 100 % (04/11 0854) Last BM Date: 03/04/20 General:    AA female in NAD Abdomen:  Soft, mild epigastric tenderness to palpation, nondistended.  Extremities:  Without edema. Neurologic:  Alert and oriented,  grossly normal neurologically. Psych:  Cooperative. Normal mood and affect.  Intake/Output from previous day: 04/10 0701 - 04/11 0700 In: 377.2 [I.V.:377.2] Out: 800 [Urine:800] Intake/Output this shift: No intake/output data recorded.  Lab Results: Recent Labs    03/05/20 0048 03/06/20 0623  WBC 1.5* 5.9  HGB 9.9* 9.4*  HCT 30.6* 29.6*  PLT 123* 97*   BMET Recent Labs    03/05/20 0048 03/06/20 0623  NA 140 138  K 3.8 4.0  CL 104 109  CO2 26 20*  GLUCOSE 79 68*  BUN 39* 26*  CREATININE 1.58* 1.41*  CALCIUM 9.1 8.9   LFT Recent Labs    03/06/20 0623  PROT 6.4*  ALBUMIN 2.6*  AST 556*  ALT 912*  ALKPHOS 285*  BILITOT 2.4*   PT/INR Recent Labs    03/05/20 0048  LABPROT 13.4  INR 1.0    Studies/Results: CT ABDOMEN PELVIS W CONTRAST  Result Date: 03/05/2020 CLINICAL DATA:  Crohn's disease.  Sepsis EXAM: CT ABDOMEN AND PELVIS WITH CONTRAST TECHNIQUE: Multidetector CT imaging of the abdomen and pelvis was performed using the standard protocol following bolus administration of intravenous contrast. CONTRAST:  68m OMNIPAQUE IOHEXOL 300 MG/ML  SOLN COMPARISON:  CT 01/02/2020 FINDINGS: Lower chest: Lung bases are clear. Hepatobiliary: No focal hepatic lesion. Postcholecystectomy. No biliary dilatation.  Pancreas: There is moderate dilatation of the pancreatic duct to 5 mm unchanged from prior. Moderate dilatation of the common bile duct to 7 mm. These findings are similar to most recent CT exam of 01/02/2020 and improved from CT of 11/25/2019. Sphincterotomy 11/26/2019 Spleen: Normal spleen Adrenals/urinary tract: Adrenal glands and kidneys are normal. The ureters and bladder normal. Stomach/Bowel: Stomach is normal. No bowel obstruction. There multiple surgical anastomosis is in the small bowel. No evidence of bowel obstruction or acute inflammation identified. No abscess. Apparent partial RIGHT hemicolectomy. No inflammation of the colon identified. No stricturing or obstruction. Vascular/Lymphatic: Abdominal aorta is normal caliber with atherosclerotic calcification. There is no retroperitoneal or periportal lymphadenopathy. No pelvic lymphadenopathy. LEFT femoral central venous line noted. Reproductive: Post hysterectomy. Other: No free fluid.  Abdominal abscess. Musculoskeletal: No aggressive osseous lesion. IMPRESSION: 1. No acute abdominopelvic process identified. 2. Multiple small-bowel and colon anastomoses without evidence of acute inflammation, stricturing, or bowel obstruction. 3. Dilatation of the common bile duct and pancreatic duct persistent but much improved from CT 11/25/2019 following sphincterotomy. Electronically Signed   By: SSuzy BouchardM.D.   On: 03/05/2020 06:36   DG Chest Port 1 View  Result Date: 03/05/2020 CLINICAL DATA:  Fever. EXAM: PORTABLE CHEST 1 VIEW COMPARISON:  October 28, 2019 FINDINGS: The lungs are hyperinflated. There is no evidence of acute infiltrate, pleural effusion or pneumothorax. Radiopaque surgical clips are seen overlying the suprahilar region on the right and lateral aspect  of the right lung base. The heart size and mediastinal contours are within normal limits. The visualized skeletal structures are unremarkable. IMPRESSION: No active disease.  Electronically Signed   By: Virgina Norfolk M.D.   On: 03/05/2020 01:44       Assessment / Plan:    59 y/o female with history of Crohn's disease and short gut syndrome following multiple resections, chronic pain, TPN dependent since 2003.  She has had recurrent line sepsis over the years.  See initial consult note for details. History of recurrent LAE elevations in the past, leading to ERCP in December for possible papillary stenosis. There has been some suspicion in the past that biliary sludge and a tortuous common bile duct could be causing her transient bacteremia.    Admitted again with bacteremia, gram negative rods. Concern for recurrent line infection vs. Cholangitis. PICC line to be pulled. ALT, AP, and now bili have elevated overnight. Will plan for MRCP (non contrast due to renal disease) today, she will need an anxiolytic for this, defer to primary team given her pain medication requirements. Dr. Rush Landmark will assume the GI service tomorrow, I will discuss case with him, we will need to discuss role of ERCP, MRCP may be helpful to clarify the situation.  Sunbury Cellar, MD [facility]

## 2020-03-06 NOTE — Progress Notes (Addendum)
INFECTIOUS DISEASE PROGRESS NOTE  ID: Caitlin RICCARDI is a 59 y.o. female with  Principal Problem:   Sepsis ([place]) Active Problems:   Crohn disease ([place])   Short gut syndrome   HTN (hypertension)   Depression   Chronic pain syndrome   On total parenteral nutrition (TPN)   Elevated LFTs   Pancytopenia ([name])   Stage 3b chronic kidney disease  Subjective: C/o back pain, abd pain, headache  Abtx:  Anti-infectives (From admission, onward)   Start     Dose/Rate Route Frequency Ordered Stop   03/05/20 1130  metroNIDAZOLE (FLAGYL) IVPB 500 mg     500 mg 100 mL/hr over 60 Minutes Intravenous Every 8 hours 03/05/20 1111     03/05/20 1000  aztreonam (AZACTAM) 1 g in sodium chloride 0.9 % 100 mL IVPB     1 g 200 mL/hr over 30 Minutes Intravenous Every 8 hours 03/05/20 0738     03/05/20 1000  vancomycin (VANCOREADY) IVPB 750 mg/150 mL  Status:  Discontinued     750 mg 150 mL/hr over 60 Minutes Intravenous Every 24 hours 03/05/20 0738 03/06/20 1102   03/05/20 0045  aztreonam (AZACTAM) 2 g in sodium chloride 0.9 % 100 mL IVPB     2 g 200 mL/hr over 30 Minutes Intravenous  Once 03/05/20 0031 03/05/20 0158   03/05/20 0045  metroNIDAZOLE (FLAGYL) IVPB 500 mg     500 mg 100 mL/hr over 60 Minutes Intravenous  Once 03/05/20 0031 03/05/20 0346   03/05/20 0045  vancomycin (VANCOCIN) IVPB 1000 mg/200 mL premix     1,000 mg 200 mL/hr over 60 Minutes Intravenous  Once 03/05/20 0031 03/05/20 0450      Medications:  Scheduled: . amLODipine  5 mg Oral Daily  . budesonide  9 mg Oral Daily  . buPROPion  200 mg Oral Daily  . cycloSPORINE  1 drop Both Eyes BID  . dicyclomine  20 mg Oral TID  . docusate sodium  100 mg Oral Daily  . DULoxetine  90 mg Oral Daily  . enoxaparin (LOVENOX) injection  40 mg Subcutaneous Q24H  . estradiol  2 mg Oral Daily  . famotidine  20 mg Oral BID  . methadone  5 mg Oral 5 X Daily  . metoprolol tartrate  100 mg Oral BID  . pantoprazole  40 mg Oral Daily  .  sodium chloride flush  3 mL Intravenous Q12H  . sucralfate  1 g Oral TID WC & HS    Objective: Vital signs in last 24 hours: Temp:  [98.1 F (36.7 C)-100.9 F (38.3 C)] 98.1 F (36.7 C) (04/11 0854) Pulse Rate:  [53-80] 53 (04/11 0854) Resp:  [12-19] 19 (04/11 0854) BP: (136-170)/(65-92) 170/92 (04/11 0854) SpO2:  [99 %-100 %] 100 % (04/11 0854) Weight:  [66.9 kg] 66.9 kg (04/10 1223)   General appearance: alert, cooperative and mild distress Resp: clear to auscultation bilaterally Cardio: regular rate and rhythm GI: normal findings: bowel sounds normal and soft, non-tender and abnormal findings:  distended Extremities: L thigh PIC clean  Lab Results Recent Labs    03/05/20 0048 03/06/20 0623  WBC 1.5* 5.9  HGB 9.9* 9.4*  HCT 30.6* 29.6*  NA 140 138  K 3.8 4.0  CL 104 109  CO2 26 20*  BUN 39* 26*  CREATININE 1.58* 1.41*   Liver Panel Recent Labs    03/05/20 0048 03/06/20 0623  PROT 6.8 6.4*  ALBUMIN 3.1* 2.6*  AST 884* 556*  ALT 607* 912*  ALKPHOS 253* 285*  BILITOT 1.0 2.4*   Sedimentation Rate No results for input(s): ESRSEDRATE in the last 72 hours. C-Reactive Protein No results for input(s): CRP in the last 72 hours.  Microbiology: Recent Results (from the past 240 hour(s))  Respiratory Panel by RT PCR (Flu A&B, Covid) - Nasopharyngeal Swab     Status: None   Collection Time: 03/05/20 12:48 AM   Specimen: Nasopharyngeal Swab  Result Value Ref Range Status   SARS Coronavirus 2 by RT PCR NEGATIVE NEGATIVE Final    Comment: (NOTE) SARS-CoV-2 target nucleic acids are NOT DETECTED. The SARS-CoV-2 RNA is generally detectable in upper respiratoy specimens during the acute phase of infection. The lowest concentration of SARS-CoV-2 viral copies this assay can detect is 131 copies/mL. A negative result does not preclude SARS-Cov-2 infection and should not be used as the sole basis for treatment or other patient management decisions. A negative result may  occur with  improper specimen collection/handling, submission of specimen other than nasopharyngeal swab, presence of viral mutation(s) within the areas targeted by this assay, and inadequate number of viral copies (<131 copies/mL). A negative result must be combined with clinical observations, patient history, and epidemiological information. The expected result is Negative. Fact Sheet for Patients:  PinkCheek.be Fact Sheet for Healthcare Providers:  GravelBags.it This test is not yet ap proved or cleared by the [place] FDA and  has been authorized for detection and/or diagnosis of SARS-CoV-2 by FDA under an Emergency Use Authorization (EUA). This EUA will remain  in effect (meaning this test can be used) for the duration of the COVID-19 declaration under Section 564(b)(1) of the Act, 21 U.S.C. section 360bbb-3(b)(1), unless the authorization is terminated or revoked sooner.    Influenza A by PCR NEGATIVE NEGATIVE Final   Influenza B by PCR NEGATIVE NEGATIVE Final    Comment: (NOTE) The Xpert Xpress SARS-CoV-2/FLU/RSV assay is intended as an aid in  the diagnosis of influenza from Nasopharyngeal swab specimens and  should not be used as a sole basis for treatment. Nasal washings and  aspirates are unacceptable for Xpert Xpress SARS-CoV-2/FLU/RSV  testing. Fact Sheet for Patients: PinkCheek.be Fact Sheet for Healthcare Providers: GravelBags.it This test is not yet approved or cleared by the [place] FDA and  has been authorized for detection and/or diagnosis of SARS-CoV-2 by  FDA under an Emergency Use Authorization (EUA). This EUA will remain  in effect (meaning this test can be used) for the duration of the  Covid-19 declaration under Section 564(b)(1) of the Act, 21  U.S.C. section 360bbb-3(b)(1), unless the authorization is  terminated or  revoked. Performed at [facility], [place] [address], [place], [place] [address]   Blood Culture (routine x 2)     Status: None (Preliminary result)   Collection Time: 03/05/20 12:51 AM   Specimen: BLOOD  Result Value Ref Range Status   Specimen Description BLOOD RIGHT WRIST  Final   Special Requests   Final    BOTTLES DRAWN AEROBIC AND ANAEROBIC Blood Culture results may not be optimal due to an inadequate volume of blood received in culture bottles   Culture   Final    NO GROWTH 1 DAY Performed at [facility], [place] [address] [place],  [address]    Report Status PENDING  Incomplete  Blood Culture (routine x 2)     Status: None (Preliminary result)   Collection Time: 03/05/20  1:06 AM   Specimen: BLOOD  Result Value Ref Range Status   Specimen Description BLOOD LEFT FEMORAL ARTERY  Final   Special Requests   Final    BOTTLES DRAWN AEROBIC AND ANAEROBIC Blood Culture results may not be optimal due to an excessive volume of blood received in culture bottles   Culture  Setup Time   Final    GRAM NEGATIVE RODS IN BOTH AEROBIC AND ANAEROBIC BOTTLES CRITICAL RESULT CALLED TO, READ BACK BY AND VERIFIED WITH: PHARMD K HURTH [id number] AT 1726 BY CM Performed at [facility], [place] [address] [place], [place] [address]    Culture GRAM NEGATIVE RODS  Final   Report Status PENDING  Incomplete  Blood Culture ID Panel (Reflexed)     Status: None   Collection Time: 03/05/20  1:06 AM  Result Value Ref Range Status   Enterococcus species NOT DETECTED NOT DETECTED Final   Listeria monocytogenes NOT DETECTED NOT DETECTED Final   Staphylococcus species NOT DETECTED NOT DETECTED Final   Staphylococcus aureus (BCID) NOT DETECTED NOT DETECTED Final   Streptococcus species NOT DETECTED NOT DETECTED Final   Streptococcus agalactiae NOT DETECTED NOT DETECTED Final   Streptococcus pneumoniae NOT DETECTED NOT DETECTED Final   Streptococcus pyogenes NOT DETECTED NOT DETECTED Final    Acinetobacter baumannii NOT DETECTED NOT DETECTED Final   Enterobacteriaceae species NOT DETECTED NOT DETECTED Final   Enterobacter cloacae complex NOT DETECTED NOT DETECTED Final   Escherichia coli NOT DETECTED NOT DETECTED Final   Klebsiella oxytoca NOT DETECTED NOT DETECTED Final   Klebsiella pneumoniae NOT DETECTED NOT DETECTED Final   Proteus species NOT DETECTED NOT DETECTED Final   Serratia marcescens NOT DETECTED NOT DETECTED Final   Haemophilus influenzae NOT DETECTED NOT DETECTED Final   Neisseria meningitidis NOT DETECTED NOT DETECTED Final   Pseudomonas aeruginosa NOT DETECTED NOT DETECTED Final   Candida albicans NOT DETECTED NOT DETECTED Final   Candida glabrata NOT DETECTED NOT DETECTED Final   Candida krusei NOT DETECTED NOT DETECTED Final   Candida parapsilosis NOT DETECTED NOT DETECTED Final   Candida tropicalis NOT DETECTED NOT DETECTED Final    Comment: Performed at [facility], [place] [address], [place], [place] [address]  Urine culture     Status: None (Preliminary result)   Collection Time: 03/05/20  8:05 AM   Specimen: In/Out Cath Urine  Result Value Ref Range Status   Specimen Description IN/OUT CATH URINE  Final   Special Requests NONE  Final   Culture   Final    CULTURE REINCUBATED FOR BETTER GROWTH Performed at [facility], [place] [address], [place], [place] [address]    Report Status PENDING  Incomplete    Studies/Results: CT ABDOMEN PELVIS W CONTRAST  Result Date: 03/05/2020 CLINICAL DATA:  Crohn's disease.  Sepsis EXAM: CT ABDOMEN AND PELVIS WITH CONTRAST TECHNIQUE: Multidetector CT imaging of the abdomen and pelvis was performed using the standard protocol following bolus administration of intravenous contrast. CONTRAST:  13m OMNIPAQUE IOHEXOL 300 MG/ML  SOLN COMPARISON:  CT 01/02/2020 FINDINGS: Lower chest: Lung bases are clear. Hepatobiliary: No focal hepatic lesion. Postcholecystectomy. No biliary dilatation. Pancreas: There is moderate  dilatation of the pancreatic duct to 5 mm unchanged from prior. Moderate dilatation of the common bile duct to 7 mm. These findings are similar to most recent CT exam of 01/02/2020 and improved from CT of 11/25/2019. Sphincterotomy 11/26/2019 Spleen: Normal spleen Adrenals/urinary tract: Adrenal glands and kidneys are normal. The ureters and bladder normal. Stomach/Bowel: Stomach  is normal. No bowel obstruction. There multiple surgical anastomosis is in the small bowel. No evidence of bowel obstruction or acute inflammation identified. No abscess. Apparent partial RIGHT hemicolectomy. No inflammation of the colon identified. No stricturing or obstruction. Vascular/Lymphatic: Abdominal aorta is normal caliber with atherosclerotic calcification. There is no retroperitoneal or periportal lymphadenopathy. No pelvic lymphadenopathy. LEFT femoral central venous line noted. Reproductive: Post hysterectomy. Other: No free fluid.  Abdominal abscess. Musculoskeletal: No aggressive osseous lesion. IMPRESSION: 1. No acute abdominopelvic process identified. 2. Multiple small-bowel and colon anastomoses without evidence of acute inflammation, stricturing, or bowel obstruction. 3. Dilatation of the common bile duct and pancreatic duct persistent but much improved from CT 11/25/2019 following sphincterotomy. Electronically Signed   By: Suzy Bouchard M.D.   On: 03/05/2020 06:36   DG Chest Port 1 View  Result Date: 03/05/2020 CLINICAL DATA:  Fever. EXAM: PORTABLE CHEST 1 VIEW COMPARISON:  October 28, 2019 FINDINGS: The lungs are hyperinflated. There is no evidence of acute infiltrate, pleural effusion or pneumothorax. Radiopaque surgical clips are seen overlying the suprahilar region on the right and lateral aspect of the right lung base. The heart size and mediastinal contours are within normal limits. The visualized skeletal structures are unremarkable. IMPRESSION: No active disease. Electronically Signed   By: Virgina Norfolk M.D.   On: 03/05/2020 01:44     Assessment/Plan: Fever Sepsis BCx 2/4 GNR Suspected PIC line infection Multiple previous BSI Short Gut syndrome  Crohn's Disease  Total days of antibiotics: 1 vanco/aztreonam  Stop vanco Consult placed for IR for pic removal Fever better today Await further info from bcx Repeat BCx when PIC removed.  Repeat PIC when BCx cleared Evaluate increased LFTs (this would be the perfect situation for hepatic candidiasis). CT negative for liver lesions.      Bobby Rumpf MD, FACP Infectious Diseases (pager) [phone] [email or web address] 03/06/2020, 11:02 AM  LOS: 1 day

## 2020-03-07 ENCOUNTER — Inpatient Hospital Stay (HOSPITAL_COMMUNITY): Payer: Medicare Other

## 2020-03-07 DIAGNOSIS — K8689 Other specified diseases of pancreas: Secondary | ICD-10-CM

## 2020-03-07 DIAGNOSIS — K50819 Crohn's disease of both small and large intestine with unspecified complications: Secondary | ICD-10-CM | POA: Diagnosis not present

## 2020-03-07 DIAGNOSIS — R933 Abnormal findings on diagnostic imaging of other parts of digestive tract: Secondary | ICD-10-CM

## 2020-03-07 DIAGNOSIS — B9689 Other specified bacterial agents as the cause of diseases classified elsewhere: Secondary | ICD-10-CM

## 2020-03-07 LAB — COMPREHENSIVE METABOLIC PANEL
ALT: 645 U/L — ABNORMAL HIGH (ref 0–44)
AST: 243 U/L — ABNORMAL HIGH (ref 15–41)
Albumin: 2.7 g/dL — ABNORMAL LOW (ref 3.5–5.0)
Alkaline Phosphatase: 262 U/L — ABNORMAL HIGH (ref 38–126)
Anion gap: 9 (ref 5–15)
BUN: 16 mg/dL (ref 6–20)
CO2: 21 mmol/L — ABNORMAL LOW (ref 22–32)
Calcium: 9.1 mg/dL (ref 8.9–10.3)
Chloride: 106 mmol/L (ref 98–111)
Creatinine, Ser: 1.53 mg/dL — ABNORMAL HIGH (ref 0.44–1.00)
GFR calc Af Amer: 43 mL/min — ABNORMAL LOW (ref >60)
GFR calc non Af Amer: 37 mL/min — ABNORMAL LOW (ref >60)
Glucose, Bld: 105 mg/dL — ABNORMAL HIGH (ref 70–99)
Potassium: 4 mmol/L (ref 3.5–5.1)
Sodium: 136 mmol/L (ref 135–145)
Total Bilirubin: 1.4 mg/dL — ABNORMAL HIGH (ref 0.3–1.2)
Total Protein: 6.3 g/dL — ABNORMAL LOW (ref 6.5–8.1)

## 2020-03-07 LAB — DIFFERENTIAL
Abs Immature Granulocytes: 0.01 10*3/uL (ref 0.00–0.07)
Basophils Absolute: 0 10*3/uL (ref 0.0–0.1)
Basophils Relative: 0 %
Eosinophils Absolute: 0.1 10*3/uL (ref 0.0–0.5)
Eosinophils Relative: 2 %
Immature Granulocytes: 0 %
Lymphocytes Relative: 17 %
Lymphs Abs: 0.9 10*3/uL (ref 0.7–4.0)
Monocytes Absolute: 0.4 10*3/uL (ref 0.1–1.0)
Monocytes Relative: 8 %
Neutro Abs: 3.8 10*3/uL (ref 1.7–7.7)
Neutrophils Relative %: 73 %

## 2020-03-07 LAB — CULTURE, BLOOD (ROUTINE X 2)

## 2020-03-07 LAB — URINE CULTURE: Culture: 30000 — AB

## 2020-03-07 LAB — CBC
HCT: 26.6 % — ABNORMAL LOW (ref 36.0–46.0)
Hemoglobin: 8.7 g/dL — ABNORMAL LOW (ref 12.0–15.0)
MCH: 30.3 pg (ref 26.0–34.0)
MCHC: 32.7 g/dL (ref 30.0–36.0)
MCV: 92.7 fL (ref 80.0–100.0)
Platelets: 101 10*3/uL — ABNORMAL LOW (ref 150–400)
RBC: 2.87 MIL/uL — ABNORMAL LOW (ref 3.87–5.11)
RDW: 14.2 % (ref 11.5–15.5)
WBC: 5.3 10*3/uL (ref 4.0–10.5)
nRBC: 0 % (ref 0.0–0.2)

## 2020-03-07 LAB — MAGNESIUM: Magnesium: 1.8 mg/dL (ref 1.7–2.4)

## 2020-03-07 LAB — PHOSPHORUS: Phosphorus: 3 mg/dL (ref 2.5–4.6)

## 2020-03-07 LAB — PREALBUMIN: Prealbumin: 20.7 mg/dL (ref 18–38)

## 2020-03-07 LAB — TRIGLYCERIDES: Triglycerides: 88 mg/dL (ref <150)

## 2020-03-07 MED ORDER — SALINE SPRAY 0.65 % NA SOLN
1.0000 | NASAL | Status: DC | PRN
  Filled 2020-03-07: qty 44

## 2020-03-07 MED ORDER — CHLORHEXIDINE GLUCONATE CLOTH 2 % EX PADS
6.0000 | MEDICATED_PAD | Freq: Every day | CUTANEOUS | Status: DC
  Administered 2020-03-07 – 2020-03-11 (×4): 6 via TOPICAL

## 2020-03-07 MED ORDER — VANCOMYCIN HCL IN DEXTROSE 1-5 GM/200ML-% IV SOLN
1000.0000 mg | INTRAVENOUS | Status: AC
  Filled 2020-03-07: qty 200

## 2020-03-07 MED ORDER — SODIUM CHLORIDE 0.9% FLUSH: 10.0000 mL | INTRAVENOUS | Status: DC | PRN

## 2020-03-07 MED ORDER — SODIUM CHLORIDE 0.9% FLUSH
10.0000 mL | Freq: Two times a day (BID) | INTRAVENOUS | Status: DC
  Administered 2020-03-07: 10 mL
  Administered 2020-03-10: 20 mL
  Administered 2020-03-11: 10 mL

## 2020-03-07 NOTE — Progress Notes (Signed)
Initial Nutrition Assessment  DOCUMENTATION CODES:   Not applicable  INTERVENTION:   -Continue clear liquid diet for comfort -RD will follow for resumption of TPN  NUTRITION DIAGNOSIS:   Increased nutrient needs related to chronic illness(short gut syndrome secondary to crohn's disease) as evidenced by estimated needs.  GOAL:   Patient will meet greater than or equal to 90% of their needs  MONITOR:   PO intake, Diet advancement, Labs, Weight trends, Skin  REASON FOR ASSESSMENT:   Consult New TPN/TNA  ASSESSMENT:   Caitlin Peters is a 59 y.o. female with medical history significant of short gut syndrome with malnutrition, on TPN, associated with Crohn's disease and multiple resections; iron deficiency anemia requiring recurrent transfusions; recurrent bacteremia; HTN; stage 3 CKD; and chronic pain syndrome presenting with sepsis.  She was last hospitalized from 2/6-15 with recurrent Enterobacter bacteremia thought to be associated with a line infection (vs. Less likely GI source).  She had exchange on tunneled left CVC by IR on 2/11 and was consulted on by IR, GI, and ID as well as nephrology for acute on chronic kidney disease.  Pt admitted with sepsis.   Reviewed I/O's: +1.1 L x 24 hours and +2.8 L since admission  UOP: 1 L x 24 hours  Per GI notes, concern for recurrent line infection vs cholangitis. Plan for ERCP/ MRCP on 03/08/20.   Spoke with pt at bedside, who was pleasant and in good spirits today. She reports consuming clear liquids without difficulty. Pt is familiar to this RD due to multiple prior hospitalizations. She reports making good progress since last hospitalization approximately 2 months ago. Per her report, she has been infusing her cyclic TPN daily herself without issues. She also eats for comfort- grazing on snacks and consuming one small meal per day, such as tacos.   Pt reports weight gain (125# to 145#) over the past few months. Noted a 11.8% wt  gain over the past 3 months per wt hx. She has noticed that she is more active and has improved muscle tone; shares stories with this RD about walking with her grandchildren to the [employer] gas station near her home to purchase them treats.   Per pharmacy notes, plan to remove PICC line ASAP. No plans to resume TPN until PICC line can safely be replaced.    Labs reviewed.   NUTRITION - FOCUSED PHYSICAL EXAM:    Most Recent Value  Orbital Region  No depletion  Upper Arm Region  No depletion  Thoracic and Lumbar Region  No depletion  Buccal Region  No depletion  Temple Region  No depletion  Clavicle Bone Region  No depletion  Clavicle and Acromion Bone Region  No depletion  Scapular Bone Region  No depletion  Dorsal Hand  No depletion  Patellar Region  No depletion  Anterior Thigh Region  No depletion  Posterior Calf Region  No depletion  Edema (RD Assessment)  None  Hair  Reviewed  Eyes  Reviewed  Mouth  Reviewed  Skin  Reviewed  Nails  Reviewed       Diet Order:   Diet Order            Diet NPO time specified Except for: Sips with Meds  Diet effective midnight        Diet clear liquid Room service appropriate? Yes; Fluid consistency: Thin  Diet effective now              EDUCATION NEEDS:   Education needs  have been addressed  Skin:  Skin Assessment: Reviewed RN Assessment  Last BM:  03/04/20  Height:   Ht Readings from Last 1 Encounters:  03/04/20 5' 8"  (1.727 m)    Weight:   Wt Readings from Last 1 Encounters:  03/05/20 66.9 kg    Ideal Body Weight:  63.6 kg  BMI:  Body mass index is 22.43 kg/m.  Estimated Nutritional Needs:   Kcal:  2000-2200  Protein:  100-115 grams  Fluid:  > 2 L    Loistine Chance, RD, LDN, [place] Registered Dietitian II Certified Diabetes Care and Education Specialist Please refer to [facility] for RD and/or RD on-call/weekend/after hours pager

## 2020-03-07 NOTE — Progress Notes (Signed)
Patient Status: [facility] - In-pt  Assessment and Plan: Patient in need of venous access.   Patient with history of Crohn's disease, short gut syndrome with malnutrition requiring TPN, recurrent bacteremia recently seen in IR for tunneled central venous catheter exchange with Dr. Earleen Newport 01/07/20.  At that time, a large fibrin sheath was noted requiring intervention as well as angioplasty to pass double lumen 6 Fr cuffed catheter.   Patient readmitted with bacteremia, blood cultures positive for stenotrophomonas maltophilia.  PA assessed at bedside.  Line in place.  No purulence, however cuff is out.   IR consulted for tunneled central venous catheter removal.  After multiple discussions today with primary team, ID, and patient ultimately a plan was made to reattempt catheter exchange. This was notably difficult in February and patient with history of several previous lines likely limited her IV access sites.   After discussion, patient is agreeable.  Plan for possible tunneled central venous catheter exchange in IR tomorrow.  Vancomycin ordered.  NPO p MN.   Risks and benefits discussed with the patient including, but not limited to bleeding, infection, vascular injury, pneumothorax which may require chest tube placement, air embolism or even death  All of the patient's questions were answered, patient is agreeable to proceed. Consent signed and in chart.  ______________________________________________________________________   History of Present Illness: Caitlin Peters is a 59 y.o. female with past medical history of Crohn's disease, short gut syndrome with malnutrition, on home TPN who has had recurrent bacteremia who presented to [facility] ED with fevers and pain.  She was recently admitted with Enterobacter bacteremia in February 2021 at which time she underwent tunneled line exchange.  This was complicated by large fibrin sheath requiring prolonged sedation time as well as angioplasty in  order to replace double lumen tunneled central venous catheter. She is now in need of line holiday vs. Exchange vs. Replacement.  Allergies and medications reviewed.   Review of Systems: A 12 point ROS discussed and pertinent positives are indicated in the HPI above.  All other systems are negative.  Review of Systems  Constitutional: Negative for fatigue and fever.  Respiratory: Negative for cough and shortness of breath.   Cardiovascular: Negative for chest pain.  Gastrointestinal: Negative for abdominal pain, nausea and vomiting.  Genitourinary: Negative for dysuria.  Musculoskeletal: Negative for back pain.  Psychiatric/Behavioral: Negative for behavioral problems and confusion.    Vital Signs: BP (!) 152/69   Pulse 60   Temp 98.2 F (36.8 C)   Resp 18   Ht 5' 8"  (1.727 m)   Wt 147 lb 7.8 oz (66.9 kg)   SpO2 98%   BMI 22.43 kg/m   Physical Exam Vitals and nursing note reviewed.  Constitutional:      General: She is not in acute distress.    Appearance: Normal appearance. She is not ill-appearing.  HENT:     Mouth/Throat:     Mouth: Mucous membranes are moist.     Pharynx: Oropharynx is clear.  Cardiovascular:     Rate and Rhythm: Normal rate. Rhythm irregular.  Pulmonary:     Effort: Pulmonary effort is normal.     Breath sounds: Normal breath sounds.  Abdominal:     General: Abdomen is flat.  Musculoskeletal:        General: Normal range of motion.     Comments: L tunneled central venous catheter in place, cuff retracted out of skin.   Neurological:     General: No focal  deficit present.     Mental Status: She is alert and oriented to person, place, and time. Mental status is at baseline.  Psychiatric:        Mood and Affect: Mood normal.        Behavior: Behavior normal.        Thought Content: Thought content normal.        Judgment: Judgment normal.      Imaging reviewed.   Labs:  COAGS: Recent Labs    01/02/20 1139 03/05/20 0048  INR 1.1  1.0  APTT  --  24    BMP: Recent Labs    01/11/20 0305 03/05/20 0048 03/06/20 0623 03/07/20 0508  NA 139 140 138 136  K 4.3 3.8 4.0 4.0  CL 114* 104 109 106  CO2 18* 26 20* 21*  GLUCOSE 99 79 68* 105*  BUN 34* 39* 26* 16  CALCIUM 9.1 9.1 8.9 9.1  CREATININE 1.58* 1.58* 1.41* 1.53*  GFRNONAA 36* 36* 41* 37*  GFRAA 41* 41* 47* 43*       Electronically Signed: Docia Barrier, PA 03/07/2020, 3:21 PM   I spent a total of 25 minutes in face to face in clinical consultation, greater than 50% of which was counseling/coordinating care for venous access.

## 2020-03-07 NOTE — H&P (View-Only) (Signed)
Progress Note    ASSESSMENT AND PLAN:   # Sepsis  --ID following --BC 2/4 GNR --PIC line is suspected to be source. Multiple previous infections. Awaiting PIC removal  # Crohn's disease s/p multiple resections / short gut syndrome and TNA dependent --Continue Budesonide  # Abnormal liver chemistries / bilary duct dilation.  --Liver tests improved overnight. Ak phos 285 to 262. AST 550s to 243, ALT 912 to 645 and Tbili 2.4 to 1.4 --MRCP shows persistent diffuse PD dialtion but degrese of DBD dilation is now normal but there is mild prominence of intrahepatic ducts. No evidence for choledocholithiasis but distal CBD poorly visualized. Biliary sludge or ampullary lesions cannot be excluded on the noncontrast examination --For further evaluation patient will be scheduled for ERCP, +/- EUS tomorrow. The benefits and risks of the procedures including infection, bleeding, perforation and pancreatitis were discussed with the patient and she agrees to proceed.  --INR I normal at 1.0. Check liver chemistries in am --Will hold Lovenox after today's dose in preparation for procedures  # Chronic [place] anemia, stable.    SUBJECTIVE    Feels better today compared to yesterday.    OBJECTIVE:     Vital signs in last 24 hours: Temp:  [98.1 F (36.7 C)-98.2 F (36.8 C)] 98.2 F (36.8 C) (04/11 2038) Pulse Rate:  [53-54] 54 (04/11 2038) Resp:  [17-19] 18 (04/11 2038) BP: (131-170)/(65-92) 136/65 (04/11 2038) SpO2:  [89 %-100 %] 89 % (04/11 2038) Last BM Date: 03/04/20 General:   Alert, well-developed female in NAD Heart:  Regular rate and rhythm;  No lower extremity edema   Pulm: Normal respiratory effort   Abdomen:  Soft, nondistended, nontender.  Normal bowel sounds.          Neurologic:  Alert and  oriented x4;  grossly normal neurologically. Psych:  Pleasant, cooperative.  Normal mood and affect.   Intake/Output from previous day: 04/11 0701 - 04/12 0700 In: 2058 [I.V.:1538;  IV Piggyback:520] Out: 1000 [Urine:1000] Intake/Output this shift: No intake/output data recorded.  Lab Results: Recent Labs    03/05/20 0048 03/06/20 0623 03/07/20 0508  WBC 1.5* 5.9 5.3  HGB 9.9* 9.4* 8.7*  HCT 30.6* 29.6* 26.6*  PLT 123* 97* 101*   BMET Recent Labs    03/05/20 0048 03/06/20 0623 03/07/20 0508  NA 140 138 136  K 3.8 4.0 4.0  CL 104 109 106  CO2 26 20* 21*  GLUCOSE 79 68* 105*  BUN 39* 26* 16  CREATININE 1.58* 1.41* 1.53*  CALCIUM 9.1 8.9 9.1   LFT Recent Labs    03/07/20 0508  PROT 6.3*  ALBUMIN 2.7*  AST 243*  ALT 645*  ALKPHOS 262*  BILITOT 1.4*   PT/INR Recent Labs    03/05/20 0048  LABPROT 13.4  INR 1.0   Hepatitis Panel No results for input(s): HEPBSAG, HCVAB, HEPAIGM, HEPBIGM in the last 72 hours.  MR ABDOMEN MRCP WO CONTRAST  Result Date: 03/07/2020 CLINICAL DATA:  59 year old female with history of jaundice. Abnormal liver enzymes. EXAM: MRI ABDOMEN WITHOUT CONTRAST  (INCLUDING MRCP) TECHNIQUE: Multiplanar multisequence MR imaging of the abdomen was performed. Heavily T2-weighted images of the biliary and pancreatic ducts were obtained, and three-dimensional MRCP images were rendered by post processing. COMPARISON:  CT the abdomen and pelvis 03/05/2020. FINDINGS: Comment: Today's study is limited for detection and characterization of visceral and/or vascular lesions by lack of IV gadolinium. There is also considerable respiratory motion on many of the pulse  sequences. Lower chest: Cardiomegaly. Trace right pleural effusion lying dependently. Hepatobiliary: No definite suspicious cystic or solid hepatic lesions are confidently identified on today's noncontrast examination. MRCP images demonstrate very mild prominence of the intrahepatic bile ducts. Common bile duct is currently normal in size measuring up to 6 mm in the porta hepatis. The distal common bile duct immediately before the level of the ampulla is not visualized. No definite  filling defect in the common bile duct to clearly indicate the presence of choledocholithiasis. Status post cholecystectomy. Pancreas: No pancreatic mass. MRCP images again demonstrate diffuse pancreatic ductal dilatation which extends to the level of the ampulla with the main pancreatic duct measuring up to 6 mm in the body and head of the pancreas. No peripancreatic fluid collections or inflammatory changes. Spleen: Spleen appears borderline enlarged measuring 11.1 x 5.0 x 10.6 cm (estimated splenic volume of 294 mL). Adrenals/Urinary Tract: Unenhanced appearance of the kidneys and bilateral adrenal glands is grossly unremarkable. No hydroureteronephrosis in the visualized portions of the abdomen. Stomach/Bowel: Moderate gaseous distension in the visualized colon. Otherwise, unremarkable. Vascular/Lymphatic: No aneurysm identified in the visualized abdominal vasculature. No lymphadenopathy noted in the abdomen. Other: No significant volume of ascites noted in the visualized portions of the peritoneal cavity. Musculoskeletal: No aggressive appearing osseous lesions are noted in the visualized portions of the skeleton. IMPRESSION: 1. Persistent diffuse pancreatic ductal dilatation, similar to prior exam from 01/02/2020. Compared to the prior study, the degree of common bile duct dilatation has decreased and is now within normal limits, although there continues to be some very mild prominence of the intrahepatic bile ducts. The distal common bile duct at and immediately proximal to the ampulla is poorly demonstrated on today's noncontrast examination. Although there is no discrete filling defect to suggest choledocholithiasis, the lack of visualization makes it difficult to exclude the presence of biliary sludge in the distal common bile duct or obstructing ampullary lesion on today's noncontrast examination. 2. Cardiomegaly. 3. Trace right pleural effusion lying dependently. Electronically Signed   By: Vinnie Langton M.D.   On: 03/07/2020 07:13    Principal Problem:   Sepsis ([place]) Active Problems:   Crohn disease ([place])   Short gut syndrome   HTN (hypertension)   Depression   Chronic pain syndrome   On total parenteral nutrition (TPN)   Bacteremia   Elevated LFTs   Pancytopenia ([place])   Stage 3b chronic kidney disease     LOS: 2 days   Caitlin Peters ,NP 03/07/2020, 8:51 AM

## 2020-03-07 NOTE — Progress Notes (Signed)
PHARMACY - TOTAL PARENTERAL NUTRITION CONSULT NOTE   Indication: Short bowel syndrome  Patient Measurements: Height: 5' 8"  (172.7 cm) Weight: 66.9 kg (147 lb 7.8 oz) IBW/kg (Calculated) : 63.9 TPN AdjBW (KG): 63.5 Body mass index is 22.43 kg/m.  Assessment:  55 yof with history of complex Crohn's disease on TPN PTA for short gut syndrome presenting 01/02/20 with abdominal pain and diarrhea. Patient was admitted in February with bacteremia. Line was replaced and discharged on IV antibiotics.   Patient returns 4/10 with concern for recurrent infection with fevers. TPN has been held on admit with concern for bacteremia. Blood cx have returned positive for stenotrophomonas, now pending IR to remove femoral PICC and replace. ID recommends to repeat blood cultures when PICC removed and to replace the PICC when the blood cultures are cleared.   Glucose / Insulin: AM glucose 105 on D5LR (added to prevent hypoglycemia) Electrolytes: WNL Renal: SCr 1.53, BUN 16 LFTs / TGs: LFTs elevated but trending down, AST [phone], ALT 607>912>645 Tbili 1 > 2.4>1.4 Trigs down to 88 Prealbumin / albumin: pre 20.7, alb 2.7 Intake / Output; MIVF: UOP 0.6 mL/kg/hr GI Imaging: CT abd on admit - neg Surgeries / Procedures:   Central access: pending PICC exchange  TPN start date: PTA  Nutritional Goals (per RD recommendation on 2/8): Kcal:  1950-2150 Protein:  95-110 grams Fluid:  > 1.9 L  Home TPN - 2L cycled over 16 hours Protein 100 gm, Dex 300 gm, Lipids 40 g  Current Nutrition:  NPO  Plan:  Continue to hold TPN pending PICC exchange TPN Labs will continue *Pharmacy will sign off for now. Please re-consult when blood culture has cleared and central access replaced so TPN can be restarted.   Salome Arnt, PharmD, BCPS Clinical Pharmacist Please see AMION for all pharmacy numbers 03/07/2020 9:26 AM

## 2020-03-07 NOTE — Progress Notes (Signed)
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  Assessment & Plan:   Principal Problem:   Sepsis ([place]) Active Problems:   Crohn disease ([place])   Short gut syndrome   HTN (hypertension)   Depression   Chronic pain syndrome   On total parenteral nutrition (TPN)   Bacteremia   Elevated LFTs   Pancytopenia ([name])   Stage 3b chronic kidney disease  Sepsis with stenotrophomonas bacteremia present on admit -Pt presented with Leukopenia, fever, tachycardia, tachypnea  -Suspected source is either GI/Crohn's colitis or line infection -Pt was started on IV Aztreonam/Flagyl/Vanc. ID consulted -Blood cx pos for stenotrophomonas -Per ID, d/c above abx and transition to IV bactrim. -ID initially recommended d/c PICC, repeat blood cx and if blood cx neg, then place new PICC at that time. Discussed with IR. 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 -GI recs for EGD with ERCP 4/13  HTN -Continue Norvasc, Metoprolol as pt tolerates  Chronic pain syndrome -Takes home Methadone and Dilaudid -Dr. Lorin Mercy reviewed this patient in the [place]  Controlled Substances Reporting System.  She is receiving medications from only one provider and appears to be taking them as prescribed. -Continue with analgesics as pt needs  Depression -Continue Wellbutrin and Trazodone  Pancytopenia -WBC improved, however plts trending down to <100k -Possible resultant to septic picture  Stage 3b CKD -Appears to be at/near baseline at this time -Had ATN during her prior hospitalization  -recheck bmet in AM   DVT prophylaxis: Lovenox subq Code Status: Full Family Communication: Pt in room, family not at bedside  Status is: Inpatient  Remains inpatient appropriate because:IV treatments appropriate due to intensity of illness or inability to take PO   Dispo: The patient is from: Home              Anticipated d/c is to: Home              Anticipated d/c date is: > 3 days              Patient currently is not medically stable to d/c.        Consultants:   Infectious disease  GI  IR  Procedures:     Antimicrobials: Anti-infectives (From admission, onward)   Start     Dose/Rate Route Frequency Ordered Stop   03/06/20 1400  sulfamethoxazole-trimethoprim (BACTRIM) 320 mg in dextrose 5 % 500 mL IVPB     320 mg 346.7 mL/hr over 90 Minutes Intravenous Every 8 hours 03/06/20 1351     03/05/20 1130  metroNIDAZOLE (FLAGYL) IVPB 500 mg  Status:  Discontinued     500 mg 100 mL/hr over 60 Minutes Intravenous Every 8 hours 03/05/20  1111 03/06/20 1351   03/05/20 1000  aztreonam (AZACTAM) 1 g in sodium chloride 0.9 % 100 mL IVPB  Status:  Discontinued     1 g 200 mL/hr over 30 Minutes Intravenous Every 8 hours 03/05/20 0738 03/06/20 1351   03/05/20 1000  vancomycin (VANCOREADY) IVPB 750 mg/150 mL  Status:  Discontinued     750 mg 150 mL/hr over 60 Minutes Intravenous Every 24 hours 03/05/20 0738 03/06/20 1102   03/05/20 0045  aztreonam (AZACTAM) 2 g in sodium chloride 0.9 % 100 mL IVPB     2 g 200 mL/hr over 30 Minutes Intravenous   Once 03/05/20 0031 03/05/20 0158   03/05/20 0045  metroNIDAZOLE (FLAGYL) IVPB 500 mg     500 mg 100 mL/hr over 60 Minutes Intravenous  Once 03/05/20 0031 03/05/20 0346   03/05/20 0045  vancomycin (VANCOCIN) IVPB 1000 mg/200 mL premix     1,000 mg 200 mL/hr over 60 Minutes Intravenous  Once 03/05/20 0031 03/05/20 0450      Subjective: Still with some abd discomfort but feeling better today  Objective: Vitals:   03/06/20 0854 03/06/20 1809 03/06/20 2038 03/07/20 1241  BP: (!) 170/92 131/65 136/65 (!) 152/69  Pulse: (!) 53 (!) 53 (!) 54 60  Resp: 19 17 18    Temp: 98.1 F (36.7 C)  98.2 F (36.8 C)   TempSrc:      SpO2: 100% 100% (!) 89% 98%  Weight:      Height:        Intake/Output Summary (Last 24 hours) at 03/07/2020 1354 Last data filed at 03/07/2020 0304 Gross per 24 hour  Intake 2057.98 ml  Output 1000 ml  Net 1057.98 ml   Filed Weights   03/04/20 2347 03/05/20 1223  Weight: 63.5 kg 66.9 kg    Examination: General exam: Conversant, in no acute distress Respiratory system: normal chest rise, clear, no audible wheezing Cardiovascular system: regular rhythm, s1-s2 Gastrointestinal system: Nondistended, nontender, pos BS Central nervous system: No seizures, no tremors Extremities: No cyanosis, no joint deformities Skin: No rashes, no pallor Psychiatry: Affect normal // no auditory hallucinations   Data Reviewed: I have personally reviewed following labs and imaging studies  CBC: Recent Labs  Lab 03/05/20 0048 03/06/20 0623 03/07/20 0508  WBC 1.5* 5.9 5.3  NEUTROABS 1.3* 4.9 3.8  HGB 9.9* 9.4* 8.7*  HCT 30.6* 29.6* 26.6*  MCV 95.9 94.3 92.7  PLT 123* 97* 016*   Basic Metabolic Panel: Recent Labs  Lab 03/05/20 0048 03/06/20 0623 03/07/20 0508  NA 140 138 136  K 3.8 4.0 4.0  CL 104 109 106  CO2 26 20* 21*  GLUCOSE 79 68* 105*  BUN 39* 26* 16  CREATININE 1.58* 1.41* 1.53*  CALCIUM 9.1 8.9 9.1  MG  --  1.9 1.8  PHOS  --  2.6 3.0    GFR: Estimated Creatinine Clearance: 40.4 mL/min (A) (by C-G formula based on SCr of 1.53 mg/dL (H)). Liver Function Tests: Recent Labs  Lab 03/05/20 0048 03/06/20 0623 03/07/20 0508  AST 884* 556* 243*  ALT 607* 912* 645*  ALKPHOS 253* 285* 262*  BILITOT 1.0 2.4* 1.4*  PROT 6.8 6.4* 6.3*  ALBUMIN 3.1* 2.6* 2.7*   No results for input(s): LIPASE, AMYLASE in the last 168 hours. No results for input(s): AMMONIA in the last 168 hours. Coagulation Profile: Recent Labs  Lab 03/05/20 0048  INR 1.0   Cardiac Enzymes: Recent Labs  Lab 03/05/20 0048  CKTOTAL 67  BNP (last 3 results) No results for input(s): PROBNP in the last 8760 hours. HbA1C: No results for input(s): HGBA1C in the last 72 hours. CBG: No results for input(s): GLUCAP in the last 168 hours. Lipid Profile: Recent Labs    03/06/20 0623 03/07/20 0508  TRIG 310* 88   Thyroid Function Tests: No results for input(s): TSH, T4TOTAL, FREET4, T3FREE, THYROIDAB in the last 72 hours. Anemia Panel: No results for input(s): VITAMINB12, FOLATE, FERRITIN, TIBC, IRON, RETICCTPCT in the last 72 hours. Sepsis Labs: Recent Labs  Lab 03/05/20 0048 03/05/20 1405  PROCALCITON  --  84.79  LATICACIDVEN 1.4  --     Recent Results (from the past 240 hour(s))  Respiratory Panel by RT PCR (Flu A&B, Covid) - Nasopharyngeal Swab     Status: None   Collection Time: 03/05/20 12:48 AM   Specimen: Nasopharyngeal Swab  Result Value Ref Range Status   SARS Coronavirus 2 by RT PCR NEGATIVE NEGATIVE Final    Comment: (NOTE) SARS-CoV-2 target nucleic acids are NOT DETECTED. The SARS-CoV-2 RNA is generally detectable in upper respiratoy specimens during the acute phase of infection. The lowest concentration of SARS-CoV-2 viral copies this assay can detect is 131 copies/mL. A negative result does not preclude SARS-Cov-2 infection and should not be used as the sole basis for treatment or other patient management decisions. A  negative result may occur with  improper specimen collection/handling, submission of specimen other than nasopharyngeal swab, presence of viral mutation(s) within the areas targeted by this assay, and inadequate number of viral copies (<131 copies/mL). A negative result must be combined with clinical observations, patient history, and epidemiological information. The expected result is Negative. Fact Sheet for Patients:  PinkCheek.be Fact Sheet for Healthcare Providers:  GravelBags.it This test is not yet ap proved or cleared by the [place] FDA and  has been authorized for detection and/or diagnosis of SARS-CoV-2 by FDA under an Emergency Use Authorization (EUA). This EUA will remain  in effect (meaning this test can be used) for the duration of the COVID-19 declaration under Section 564(b)(1) of the Act, 21 U.S.C. section 360bbb-3(b)(1), unless the authorization is terminated or revoked sooner.    Influenza A by PCR NEGATIVE NEGATIVE Final   Influenza B by PCR NEGATIVE NEGATIVE Final    Comment: (NOTE) The Xpert Xpress SARS-CoV-2/FLU/RSV assay is intended as an aid in  the diagnosis of influenza from Nasopharyngeal swab specimens and  should not be used as a sole basis for treatment. Nasal washings and  aspirates are unacceptable for Xpert Xpress SARS-CoV-2/FLU/RSV  testing. Fact Sheet for Patients: PinkCheek.be Fact Sheet for Healthcare Providers: GravelBags.it This test is not yet approved or cleared by the [place] FDA and  has been authorized for detection and/or diagnosis of SARS-CoV-2 by  FDA under an Emergency Use Authorization (EUA). This EUA will remain  in effect (meaning this test can be used) for the duration of the  Covid-19 declaration under Section 564(b)(1) of the Act, 21  U.S.C. section 360bbb-3(b)(1), unless the authorization is   terminated or revoked. Performed at [facility], [place] [address], [place], [place] [address]   Blood Culture (routine x 2)     Status: None (Preliminary result)   Collection Time: 03/05/20 12:51 AM   Specimen: BLOOD  Result Value Ref Range Status   Specimen Description BLOOD RIGHT WRIST  Final   Special Requests   Final    BOTTLES DRAWN AEROBIC AND ANAEROBIC Blood Culture results may not  be optimal due to an inadequate volume of blood received in culture bottles   Culture   Final    NO GROWTH 2 DAYS Performed at [facility], [place] [address], [place], [place] [address]    Report Status PENDING  Incomplete  Blood Culture (routine x 2)     Status: Abnormal   Collection Time: 03/05/20  1:06 AM   Specimen: BLOOD  Result Value Ref Range Status   Specimen Description BLOOD LEFT FEMORAL ARTERY  Final   Special Requests   Final    BOTTLES DRAWN AEROBIC AND ANAEROBIC Blood Culture results may not be optimal due to an excessive volume of blood received in culture bottles   Culture  Setup Time   Final    GRAM NEGATIVE RODS IN BOTH AEROBIC AND ANAEROBIC BOTTLES CRITICAL RESULT CALLED TO, READ BACK BY AND VERIFIED WITH: PHARMD K HURTH [id number] AT 1726 BY CM Performed at [facility], [place] [address], [place], [place] [address]    Culture STENOTROPHOMONAS MALTOPHILIA (A)  Final   Report Status 03/07/2020 FINAL  Final   Organism ID, Bacteria STENOTROPHOMONAS MALTOPHILIA  Final      Susceptibility   Stenotrophomonas maltophilia - MIC*    LEVOFLOXACIN 0.5 SENSITIVE Sensitive     TRIMETH/SULFA <=20 SENSITIVE Sensitive     * STENOTROPHOMONAS MALTOPHILIA  Blood Culture ID Panel (Reflexed)     Status: None   Collection Time: 03/05/20  1:06 AM  Result Value Ref Range Status   Enterococcus species NOT DETECTED NOT DETECTED Final   Listeria monocytogenes NOT DETECTED NOT DETECTED Final   Staphylococcus species NOT DETECTED NOT DETECTED Final   Staphylococcus aureus (BCID) NOT  DETECTED NOT DETECTED Final   Streptococcus species NOT DETECTED NOT DETECTED Final   Streptococcus agalactiae NOT DETECTED NOT DETECTED Final   Streptococcus pneumoniae NOT DETECTED NOT DETECTED Final   Streptococcus pyogenes NOT DETECTED NOT DETECTED Final   Acinetobacter baumannii NOT DETECTED NOT DETECTED Final   Enterobacteriaceae species NOT DETECTED NOT DETECTED Final   Enterobacter cloacae complex NOT DETECTED NOT DETECTED Final   Escherichia coli NOT DETECTED NOT DETECTED Final   Klebsiella oxytoca NOT DETECTED NOT DETECTED Final   Klebsiella pneumoniae NOT DETECTED NOT DETECTED Final   Proteus species NOT DETECTED NOT DETECTED Final   Serratia marcescens NOT DETECTED NOT DETECTED Final   Haemophilus influenzae NOT DETECTED NOT DETECTED Final   Neisseria meningitidis NOT DETECTED NOT DETECTED Final   Pseudomonas aeruginosa NOT DETECTED NOT DETECTED Final   Candida albicans NOT DETECTED NOT DETECTED Final   Candida glabrata NOT DETECTED NOT DETECTED Final   Candida krusei NOT DETECTED NOT DETECTED Final   Candida parapsilosis NOT DETECTED NOT DETECTED Final   Candida tropicalis NOT DETECTED NOT DETECTED Final    Comment: Performed at [facility], [place] [address] [place], [place] [address]  Urine culture     Status: Abnormal   Collection Time: 03/05/20  8:05 AM   Specimen: In/Out Cath Urine  Result Value Ref Range Status   Specimen Description IN/OUT CATH URINE  Final   Special Requests   Final    NONE Performed at [facility], [place] [address] [place], [place] [address]    Culture 30,000 COLONIES/mL STAPHYLOCOCCUS EPIDERMIDIS (A)  Final   Report Status 03/07/2020 FINAL  Final   Organism ID, Bacteria STAPHYLOCOCCUS EPIDERMIDIS (A)  Final      Susceptibility   Staphylococcus epidermidis - MIC*  CIPROFLOXACIN 4 RESISTANT Resistant     GENTAMICIN <=0.5 SENSITIVE Sensitive     NITROFURANTOIN <=16 SENSITIVE Sensitive     OXACILLIN >=4 RESISTANT Resistant      TETRACYCLINE >=16 RESISTANT Resistant     TRIMETH/SULFA <=10 SENSITIVE Sensitive     CLINDAMYCIN >=8 RESISTANT Resistant     RIFAMPIN 1 SENSITIVE Sensitive     Inducible Clindamycin NEGATIVE Sensitive     * 30,000 COLONIES/mL STAPHYLOCOCCUS EPIDERMIDIS     Radiology Studies: MR ABDOMEN MRCP WO CONTRAST  Result Date: 03/07/2020 CLINICAL DATA:  59 year old female with history of jaundice. Abnormal liver enzymes. EXAM: MRI ABDOMEN WITHOUT CONTRAST  (INCLUDING MRCP) TECHNIQUE: Multiplanar multisequence MR imaging of the abdomen was performed. Heavily T2-weighted images of the biliary and pancreatic ducts were obtained, and three-dimensional MRCP images were rendered by post processing. COMPARISON:  CT the abdomen and pelvis 03/05/2020. FINDINGS: Comment: Today's study is limited for detection and characterization of visceral and/or vascular lesions by lack of IV gadolinium. There is also considerable respiratory motion on many of the pulse sequences. Lower chest: Cardiomegaly. Trace right pleural effusion lying dependently. Hepatobiliary: No definite suspicious cystic or solid hepatic lesions are confidently identified on today's noncontrast examination. MRCP images demonstrate very mild prominence of the intrahepatic bile ducts. Common bile duct is currently normal in size measuring up to 6 mm in the porta hepatis. The distal common bile duct immediately before the level of the ampulla is not visualized. No definite filling defect in the common bile duct to clearly indicate the presence of choledocholithiasis. Status post cholecystectomy. Pancreas: No pancreatic mass. MRCP images again demonstrate diffuse pancreatic ductal dilatation which extends to the level of the ampulla with the main pancreatic duct measuring up to 6 mm in the body and head of the pancreas. No peripancreatic fluid collections or inflammatory changes. Spleen: Spleen appears borderline enlarged measuring 11.1 x 5.0 x 10.6 cm (estimated  splenic volume of 294 mL). Adrenals/Urinary Tract: Unenhanced appearance of the kidneys and bilateral adrenal glands is grossly unremarkable. No hydroureteronephrosis in the visualized portions of the abdomen. Stomach/Bowel: Moderate gaseous distension in the visualized colon. Otherwise, unremarkable. Vascular/Lymphatic: No aneurysm identified in the visualized abdominal vasculature. No lymphadenopathy noted in the abdomen. Other: No significant volume of ascites noted in the visualized portions of the peritoneal cavity. Musculoskeletal: No aggressive appearing osseous lesions are noted in the visualized portions of the skeleton. IMPRESSION: 1. Persistent diffuse pancreatic ductal dilatation, similar to prior exam from 01/02/2020. Compared to the prior study, the degree of common bile duct dilatation has decreased and is now within normal limits, although there continues to be some very mild prominence of the intrahepatic bile ducts. The distal common bile duct at and immediately proximal to the ampulla is poorly demonstrated on today's noncontrast examination. Although there is no discrete filling defect to suggest choledocholithiasis, the lack of visualization makes it difficult to exclude the presence of biliary sludge in the distal common bile duct or obstructing ampullary lesion on today's noncontrast examination. 2. Cardiomegaly. 3. Trace right pleural effusion lying dependently. Electronically Signed   By: Vinnie Langton M.D.   On: 03/07/2020 07:13    Scheduled Meds: . amLODipine  5 mg Oral Daily  . budesonide  9 mg Oral Daily  . buPROPion  200 mg Oral Daily  . cycloSPORINE  1 drop Both Eyes BID  . dicyclomine  20 mg Oral TID  . docusate sodium  100 mg Oral Daily  . DULoxetine  90 mg Oral  Daily  . estradiol  2 mg Oral Daily  . famotidine  20 mg Oral BID  . methadone  5 mg Oral 5 X Daily  . metoprolol tartrate  100 mg Oral BID  . pantoprazole  40 mg Oral Daily  . sodium chloride flush  3 mL  Intravenous Q12H  . sucralfate  1 g Oral TID WC & HS   Continuous Infusions: . dextrose 5% lactated ringers 100 mL/hr at 03/06/20 1003  . sulfamethoxazole-trimethoprim 320 mg (03/07/20 0651)     LOS: 2 days   Marylu Lund, MD [facility] Pager On Amion  If 7PM-7AM, please contact night-coverage 03/07/2020, 1:54 PM

## 2020-03-07 NOTE — Progress Notes (Signed)
[facility]   Reason for visit: Follow up on bacteremia  Interval History: no new complaints.  Has not had picc removed yet since she has no other access.  Fever curve declined.  WBC wnl.   Remains on ceftriaxone   Physical Exam: Constitutional:  Vitals:   03/06/20 1809 03/06/20 2038  BP: 131/65 136/65  Pulse: (!) 53 (!) 54  Resp: 17 18  Temp:  98.2 F (36.8 C)  SpO2: 100% (!) 89%   patient appears in NAD Eyes: anicteric Respiratory: Normal respiratory effort; CTA B Cardiovascular: RRR  Review of Systems: Constitutional: negative for fevers and chills Gastrointestinal: negative for diarrhea  Lab Results  Component Value Date   WBC 5.3 03/07/2020   HGB 8.7 (L) 03/07/2020   HCT 26.6 (L) 03/07/2020   MCV 92.7 03/07/2020   PLT 101 (L) 03/07/2020    Lab Results  Component Value Date   CREATININE 1.53 (H) 03/07/2020   BUN 16 03/07/2020   NA 136 03/07/2020   K 4.0 03/07/2020   CL 106 03/07/2020   CO2 21 (L) 03/07/2020    Lab Results  Component Value Date   ALT 645 (H) 03/07/2020   AST 243 (H) 03/07/2020   ALKPHOS 262 (H) 03/07/2020     Microbiology: Recent Results (from the past 240 hour(s))  Respiratory Panel by RT PCR (Flu A&B, Covid) - Nasopharyngeal Swab     Status: None   Collection Time: 03/05/20 12:48 AM   Specimen: Nasopharyngeal Swab  Result Value Ref Range Status   SARS Coronavirus 2 by RT PCR NEGATIVE NEGATIVE Final    Comment: (NOTE) SARS-CoV-2 target nucleic acids are NOT DETECTED. The SARS-CoV-2 RNA is generally detectable in upper respiratoy specimens during the acute phase of infection. The lowest concentration of SARS-CoV-2 viral copies this assay can detect is 131 copies/mL. A negative result does not preclude SARS-Cov-2 infection and should not be used as the sole basis for treatment or other patient management decisions. A negative result may occur with  improper specimen collection/handling, submission of  specimen other than nasopharyngeal swab, presence of viral mutation(s) within the areas targeted by this assay, and inadequate number of viral copies (<131 copies/mL). A negative result must be combined with clinical observations, patient history, and epidemiological information. The expected result is Negative. Fact Sheet for Patients:  PinkCheek.be Fact Sheet for Healthcare Providers:  GravelBags.it This test is not yet ap proved or cleared by the [place] FDA and  has been authorized for detection and/or diagnosis of SARS-CoV-2 by FDA under an Emergency Use Authorization (EUA). This EUA will remain  in effect (meaning this test can be used) for the duration of the COVID-19 declaration under Section 564(b)(1) of the Act, 21 U.S.C. section 360bbb-3(b)(1), unless the authorization is terminated or revoked sooner.    Influenza A by PCR NEGATIVE NEGATIVE Final   Influenza B by PCR NEGATIVE NEGATIVE Final    Comment: (NOTE) The Xpert Xpress SARS-CoV-2/FLU/RSV assay is intended as an aid in  the diagnosis of influenza from Nasopharyngeal swab specimens and  should not be used as a sole basis for treatment. Nasal washings and  aspirates are unacceptable for Xpert Xpress SARS-CoV-2/FLU/RSV  testing. Fact Sheet for Patients: PinkCheek.be Fact Sheet for Healthcare Providers: GravelBags.it This test is not yet approved or cleared by the [place] FDA and  has been authorized for detection and/or diagnosis of SARS-CoV-2 by  FDA under an Emergency Use Authorization (EUA). This EUA will  remain  in effect (meaning this test can be used) for the duration of the  Covid-19 declaration under Section 564(b)(1) of the Act, 21  U.S.C. section 360bbb-3(b)(1), unless the authorization is  terminated or revoked. Performed at [facility], [place] [address], [place], [place]  [address]   Blood Culture (routine x 2)     Status: None (Preliminary result)   Collection Time: 03/05/20 12:51 AM   Specimen: BLOOD  Result Value Ref Range Status   Specimen Description BLOOD RIGHT WRIST  Final   Special Requests   Final    BOTTLES DRAWN AEROBIC AND ANAEROBIC Blood Culture results may not be optimal due to an inadequate volume of blood received in culture bottles   Culture   Final    NO GROWTH 2 DAYS Performed at [facility], [place] [address] [place], [place] [address]    Report Status PENDING  Incomplete  Blood Culture (routine x 2)     Status: Abnormal   Collection Time: 03/05/20  1:06 AM   Specimen: BLOOD  Result Value Ref Range Status   Specimen Description BLOOD LEFT FEMORAL ARTERY  Final   Special Requests   Final    BOTTLES DRAWN AEROBIC AND ANAEROBIC Blood Culture results may not be optimal due to an excessive volume of blood received in culture bottles   Culture  Setup Time   Final    GRAM NEGATIVE RODS IN BOTH AEROBIC AND ANAEROBIC BOTTLES CRITICAL RESULT CALLED TO, READ BACK BY AND VERIFIED WITH: PHARMD K HURTH [id number] AT 1726 BY CM Performed at [facility], [place] [address] [place], [place] [address]    Culture STENOTROPHOMONAS MALTOPHILIA (A)  Final   Report Status 03/07/2020 FINAL  Final   Organism ID, Bacteria STENOTROPHOMONAS MALTOPHILIA  Final      Susceptibility   Stenotrophomonas maltophilia - MIC*    LEVOFLOXACIN 0.5 SENSITIVE Sensitive     TRIMETH/SULFA <=20 SENSITIVE Sensitive     * STENOTROPHOMONAS MALTOPHILIA  Blood Culture ID Panel (Reflexed)     Status: None   Collection Time: 03/05/20  1:06 AM  Result Value Ref Range Status   Enterococcus species NOT DETECTED NOT DETECTED Final   Listeria monocytogenes NOT DETECTED NOT DETECTED Final   Staphylococcus species NOT DETECTED NOT DETECTED Final   Staphylococcus aureus (BCID) NOT DETECTED NOT DETECTED Final   Streptococcus species NOT DETECTED NOT DETECTED Final   Streptococcus  agalactiae NOT DETECTED NOT DETECTED Final   Streptococcus pneumoniae NOT DETECTED NOT DETECTED Final   Streptococcus pyogenes NOT DETECTED NOT DETECTED Final   Acinetobacter baumannii NOT DETECTED NOT DETECTED Final   Enterobacteriaceae species NOT DETECTED NOT DETECTED Final   Enterobacter cloacae complex NOT DETECTED NOT DETECTED Final   Escherichia coli NOT DETECTED NOT DETECTED Final   Klebsiella oxytoca NOT DETECTED NOT DETECTED Final   Klebsiella pneumoniae NOT DETECTED NOT DETECTED Final   Proteus species NOT DETECTED NOT DETECTED Final   Serratia marcescens NOT DETECTED NOT DETECTED Final   Haemophilus influenzae NOT DETECTED NOT DETECTED Final   Neisseria meningitidis NOT DETECTED NOT DETECTED Final   Pseudomonas aeruginosa NOT DETECTED NOT DETECTED Final   Candida albicans NOT DETECTED NOT DETECTED Final   Candida glabrata NOT DETECTED NOT DETECTED Final   Candida krusei NOT DETECTED NOT DETECTED Final   Candida parapsilosis NOT DETECTED NOT DETECTED Final   Candida tropicalis NOT DETECTED NOT DETECTED Final    Comment: Performed at [facility], [address] [place], [place] [address]  Urine culture     Status: Abnormal   Collection Time: 03/05/20  8:05 AM   Specimen: In/Out Cath Urine  Result Value Ref Range Status   Specimen Description IN/OUT CATH URINE  Final   Special Requests   Final    NONE Performed at [facility], [place] [address] [place], [place] [address]    Culture 30,000 COLONIES/mL STAPHYLOCOCCUS EPIDERMIDIS (A)  Final   Report Status 03/07/2020 FINAL  Final   Organism ID, Bacteria STAPHYLOCOCCUS EPIDERMIDIS (A)  Final      Susceptibility   Staphylococcus epidermidis - MIC*    CIPROFLOXACIN 4 RESISTANT Resistant     GENTAMICIN <=0.5 SENSITIVE Sensitive     NITROFURANTOIN <=16 SENSITIVE Sensitive     OXACILLIN >=4 RESISTANT Resistant     TETRACYCLINE >=16 RESISTANT Resistant     TRIMETH/SULFA <=10 SENSITIVE Sensitive     CLINDAMYCIN >=8  RESISTANT Resistant     RIFAMPIN 1 SENSITIVE Sensitive     Inducible Clindamycin NEGATIVE Sensitive     * 30,000 COLONIES/mL STAPHYLOCOCCUS EPIDERMIDIS    Impression/Plan:  1. Bacteremia - Stenotrophomonas positive.  On bactrim and will continue with IV therapy.  I agree with line removal as soon as possible.  Will repeat blood cultures after line removal.    2.  Short gut - will wait to assure clearance prior to replacing picc line.

## 2020-03-07 NOTE — Progress Notes (Addendum)
Progress Note    ASSESSMENT AND PLAN:   # Sepsis  --ID following --BC 2/4 GNR --PIC line is suspected to be source. Multiple previous infections. Awaiting PIC removal  # Crohn's disease s/p multiple resections / short gut syndrome and TNA dependent --Continue Budesonide  # Abnormal liver chemistries / bilary duct dilation.  --Liver tests improved overnight. Ak phos 285 to 262. AST 550s to 243, ALT 912 to 645 and Tbili 2.4 to 1.4 --MRCP shows persistent diffuse PD dialtion but degrese of DBD dilation is now normal but there is mild prominence of intrahepatic ducts. No evidence for choledocholithiasis but distal CBD poorly visualized. Biliary sludge or ampullary lesions cannot be excluded on the noncontrast examination --For further evaluation patient will be scheduled for ERCP, +/- EUS tomorrow. The benefits and risks of the procedures including infection, bleeding, perforation and pancreatitis were discussed with the patient and she agrees to proceed.  --INR I normal at 1.0. Check liver chemistries in am --Will hold Lovenox after today's dose in preparation for procedures  # Chronic [place] anemia, stable.    SUBJECTIVE    Feels better today compared to yesterday.    OBJECTIVE:     Vital signs in last 24 hours: Temp:  [98.1 F (36.7 C)-98.2 F (36.8 C)] 98.2 F (36.8 C) (04/11 2038) Pulse Rate:  [53-54] 54 (04/11 2038) Resp:  [17-19] 18 (04/11 2038) BP: (131-170)/(65-92) 136/65 (04/11 2038) SpO2:  [89 %-100 %] 89 % (04/11 2038) Last BM Date: 03/04/20 General:   Alert, well-developed female in NAD Heart:  Regular rate and rhythm;  No lower extremity edema   Pulm: Normal respiratory effort   Abdomen:  Soft, nondistended, nontender.  Normal bowel sounds.          Neurologic:  Alert and  oriented x4;  grossly normal neurologically. Psych:  Pleasant, cooperative.  Normal mood and affect.   Intake/Output from previous day: 04/11 0701 - 04/12 0700 In: 2058 [I.V.:1538;  IV Piggyback:520] Out: 1000 [Urine:1000] Intake/Output this shift: No intake/output data recorded.  Lab Results: Recent Labs    03/05/20 0048 03/06/20 0623 03/07/20 0508  WBC 1.5* 5.9 5.3  HGB 9.9* 9.4* 8.7*  HCT 30.6* 29.6* 26.6*  PLT 123* 97* 101*   BMET Recent Labs    03/05/20 0048 03/06/20 0623 03/07/20 0508  NA 140 138 136  K 3.8 4.0 4.0  CL 104 109 106  CO2 26 20* 21*  GLUCOSE 79 68* 105*  BUN 39* 26* 16  CREATININE 1.58* 1.41* 1.53*  CALCIUM 9.1 8.9 9.1   LFT Recent Labs    03/07/20 0508  PROT 6.3*  ALBUMIN 2.7*  AST 243*  ALT 645*  ALKPHOS 262*  BILITOT 1.4*   PT/INR Recent Labs    03/05/20 0048  LABPROT 13.4  INR 1.0   Hepatitis Panel No results for input(s): HEPBSAG, HCVAB, HEPAIGM, HEPBIGM in the last 72 hours.  MR ABDOMEN MRCP WO CONTRAST  Result Date: 03/07/2020 CLINICAL DATA:  59 year old female with history of jaundice. Abnormal liver enzymes. EXAM: MRI ABDOMEN WITHOUT CONTRAST  (INCLUDING MRCP) TECHNIQUE: Multiplanar multisequence MR imaging of the abdomen was performed. Heavily T2-weighted images of the biliary and pancreatic ducts were obtained, and three-dimensional MRCP images were rendered by post processing. COMPARISON:  CT the abdomen and pelvis 03/05/2020. FINDINGS: Comment: Today's study is limited for detection and characterization of visceral and/or vascular lesions by lack of IV gadolinium. There is also considerable respiratory motion on many of the pulse  sequences. Lower chest: Cardiomegaly. Trace right pleural effusion lying dependently. Hepatobiliary: No definite suspicious cystic or solid hepatic lesions are confidently identified on today's noncontrast examination. MRCP images demonstrate very mild prominence of the intrahepatic bile ducts. Common bile duct is currently normal in size measuring up to 6 mm in the porta hepatis. The distal common bile duct immediately before the level of the ampulla is not visualized. No definite  filling defect in the common bile duct to clearly indicate the presence of choledocholithiasis. Status post cholecystectomy. Pancreas: No pancreatic mass. MRCP images again demonstrate diffuse pancreatic ductal dilatation which extends to the level of the ampulla with the main pancreatic duct measuring up to 6 mm in the body and head of the pancreas. No peripancreatic fluid collections or inflammatory changes. Spleen: Spleen appears borderline enlarged measuring 11.1 x 5.0 x 10.6 cm (estimated splenic volume of 294 mL). Adrenals/Urinary Tract: Unenhanced appearance of the kidneys and bilateral adrenal glands is grossly unremarkable. No hydroureteronephrosis in the visualized portions of the abdomen. Stomach/Bowel: Moderate gaseous distension in the visualized colon. Otherwise, unremarkable. Vascular/Lymphatic: No aneurysm identified in the visualized abdominal vasculature. No lymphadenopathy noted in the abdomen. Other: No significant volume of ascites noted in the visualized portions of the peritoneal cavity. Musculoskeletal: No aggressive appearing osseous lesions are noted in the visualized portions of the skeleton. IMPRESSION: 1. Persistent diffuse pancreatic ductal dilatation, similar to prior exam from 01/02/2020. Compared to the prior study, the degree of common bile duct dilatation has decreased and is now within normal limits, although there continues to be some very mild prominence of the intrahepatic bile ducts. The distal common bile duct at and immediately proximal to the ampulla is poorly demonstrated on today's noncontrast examination. Although there is no discrete filling defect to suggest choledocholithiasis, the lack of visualization makes it difficult to exclude the presence of biliary sludge in the distal common bile duct or obstructing ampullary lesion on today's noncontrast examination. 2. Cardiomegaly. 3. Trace right pleural effusion lying dependently. Electronically Signed   By: Vinnie Langton M.D.   On: 03/07/2020 07:13    Principal Problem:   Sepsis ([place]) Active Problems:   Crohn disease ([place])   Short gut syndrome   HTN (hypertension)   Depression   Chronic pain syndrome   On total parenteral nutrition (TPN)   Bacteremia   Elevated LFTs   Pancytopenia ([place])   Stage 3b chronic kidney disease     LOS: 2 days   Tye Savoy ,NP 03/07/2020, 8:51 AM

## 2020-03-08 ENCOUNTER — Encounter (HOSPITAL_COMMUNITY): Payer: Self-pay | Admitting: Internal Medicine

## 2020-03-08 ENCOUNTER — Encounter (HOSPITAL_COMMUNITY)
Admission: EM | Disposition: A | Discharge status: Home Health | Payer: Self-pay | Source: Home / Self Care | Attending: Internal Medicine

## 2020-03-08 ENCOUNTER — Inpatient Hospital Stay (HOSPITAL_COMMUNITY): Payer: Medicare Other

## 2020-03-08 ENCOUNTER — Inpatient Hospital Stay (HOSPITAL_COMMUNITY): Payer: Medicare Other | Admitting: Anesthesiology

## 2020-03-08 DIAGNOSIS — R945 Abnormal results of liver function studies: Secondary | ICD-10-CM

## 2020-03-08 DIAGNOSIS — K50819 Crohn's disease of both small and large intestine with unspecified complications: Secondary | ICD-10-CM | POA: Diagnosis not present

## 2020-03-08 DIAGNOSIS — K3189 Other diseases of stomach and duodenum: Secondary | ICD-10-CM

## 2020-03-08 DIAGNOSIS — K8050 Calculus of bile duct without cholangitis or cholecystitis without obstruction: Secondary | ICD-10-CM

## 2020-03-08 DIAGNOSIS — I899 Noninfective disorder of lymphatic vessels and lymph nodes, unspecified: Secondary | ICD-10-CM

## 2020-03-08 DIAGNOSIS — G894 Chronic pain syndrome: Secondary | ICD-10-CM | POA: Diagnosis not present

## 2020-03-08 HISTORY — PX: BIOPSY: SHX5522

## 2020-03-08 HISTORY — PX: ESOPHAGOGASTRODUODENOSCOPY: SHX5428

## 2020-03-08 HISTORY — PX: BILIARY DILATION: SHX6850

## 2020-03-08 HISTORY — PX: ERCP: SHX5425

## 2020-03-08 HISTORY — PX: EUS: SHX5427

## 2020-03-08 HISTORY — PX: REMOVAL OF STONES: SHX5545

## 2020-03-08 LAB — COMPREHENSIVE METABOLIC PANEL
ALT: 460 U/L — ABNORMAL HIGH (ref 0–44)
AST: 122 U/L — ABNORMAL HIGH (ref 15–41)
Albumin: 2.7 g/dL — ABNORMAL LOW (ref 3.5–5.0)
Alkaline Phosphatase: 270 U/L — ABNORMAL HIGH (ref 38–126)
Anion gap: 7 (ref 5–15)
BUN: 11 mg/dL (ref 6–20)
CO2: 23 mmol/L (ref 22–32)
Calcium: 9.1 mg/dL (ref 8.9–10.3)
Chloride: 106 mmol/L (ref 98–111)
Creatinine, Ser: 1.64 mg/dL — ABNORMAL HIGH (ref 0.44–1.00)
GFR calc Af Amer: 40 mL/min — ABNORMAL LOW (ref >60)
GFR calc non Af Amer: 34 mL/min — ABNORMAL LOW (ref >60)
Glucose, Bld: 93 mg/dL (ref 70–99)
Potassium: 3.8 mmol/L (ref 3.5–5.1)
Sodium: 136 mmol/L (ref 135–145)
Total Bilirubin: 1.1 mg/dL (ref 0.3–1.2)
Total Protein: 6.4 g/dL — ABNORMAL LOW (ref 6.5–8.1)

## 2020-03-08 SURGERY — ERCP, WITH INTERVENTION IF INDICATED
Anesthesia: General

## 2020-03-08 MED ORDER — PHENYLEPHRINE 40 MCG/ML (10ML) SYRINGE FOR IV PUSH (FOR BLOOD PRESSURE SUPPORT)
PREFILLED_SYRINGE | INTRAVENOUS | Status: DC | PRN
  Administered 2020-03-08: 160 ug via INTRAVENOUS
  Administered 2020-03-08 (×2): 120 ug via INTRAVENOUS
  Administered 2020-03-08: 160 ug via INTRAVENOUS

## 2020-03-08 MED ORDER — GLUCAGON HCL RDNA (DIAGNOSTIC) 1 MG IJ SOLR
INTRAMUSCULAR | Status: AC
  Filled 2020-03-08: qty 2

## 2020-03-08 MED ORDER — ONDANSETRON HCL 4 MG/2ML IJ SOLN
INTRAMUSCULAR | Status: DC | PRN
  Administered 2020-03-08: 4 mg via INTRAVENOUS

## 2020-03-08 MED ORDER — FENTANYL CITRATE (PF) 100 MCG/2ML IJ SOLN
INTRAMUSCULAR | Status: AC
  Filled 2020-03-08: qty 2

## 2020-03-08 MED ORDER — DEXAMETHASONE SODIUM PHOSPHATE 10 MG/ML IJ SOLN
INTRAMUSCULAR | Status: DC | PRN
  Administered 2020-03-08: 10 mg via INTRAVENOUS

## 2020-03-08 MED ORDER — ONDANSETRON HCL 4 MG/2ML IJ SOLN: 4.0000 mg | Freq: Once | INTRAMUSCULAR | Status: DC | PRN

## 2020-03-08 MED ORDER — FENTANYL CITRATE (PF) 100 MCG/2ML IJ SOLN
INTRAMUSCULAR | Status: DC | PRN
  Administered 2020-03-08 (×2): 50 ug via INTRAVENOUS

## 2020-03-08 MED ORDER — INDOMETHACIN 50 MG RE SUPP
RECTAL | Status: DC | PRN
  Administered 2020-03-08: 100 mg via RECTAL

## 2020-03-08 MED ORDER — SULFAMETHOXAZOLE-TRIMETHOPRIM 400-80 MG/5ML IV SOLN
320.0000 mg | Freq: Three times a day (TID) | INTRAVENOUS | Status: DC
  Administered 2020-03-09 – 2020-03-11 (×7): 320 mg via INTRAVENOUS
  Filled 2020-03-08 (×11): qty 20

## 2020-03-08 MED ORDER — PHENYLEPHRINE HCL-NACL 10-0.9 MG/250ML-% IV SOLN
INTRAVENOUS | Status: DC | PRN
  Administered 2020-03-08: 50 ug/min via INTRAVENOUS

## 2020-03-08 MED ORDER — INDOMETHACIN 50 MG RE SUPP
RECTAL | Status: AC
  Filled 2020-03-08: qty 2

## 2020-03-08 MED ORDER — SODIUM CHLORIDE 0.9 % IV SOLN
INTRAVENOUS | Status: DC | PRN
  Administered 2020-03-08: 100 mL

## 2020-03-08 MED ORDER — PIPERACILLIN-TAZOBACTAM 3.375 G IVPB 30 MIN
3.3750 g | Freq: Once | INTRAVENOUS | Status: AC
  Administered 2020-03-08: 3.375 g via INTRAVENOUS

## 2020-03-08 MED ORDER — SODIUM CHLORIDE 0.9 % IV SOLN: INTRAVENOUS | Status: DC

## 2020-03-08 MED ORDER — PROPOFOL 10 MG/ML IV BOLUS
INTRAVENOUS | Status: DC | PRN
  Administered 2020-03-08: 150 mg via INTRAVENOUS

## 2020-03-08 MED ORDER — CIPROFLOXACIN IN D5W 400 MG/200ML IV SOLN
INTRAVENOUS | Status: AC
  Filled 2020-03-08: qty 200

## 2020-03-08 MED ORDER — ALBUMIN HUMAN 5 % IV SOLN: INTRAVENOUS | Status: DC | PRN

## 2020-03-08 MED ORDER — EPHEDRINE SULFATE-NACL 50-0.9 MG/10ML-% IV SOSY
PREFILLED_SYRINGE | INTRAVENOUS | Status: DC | PRN
  Administered 2020-03-08: 15 mg via INTRAVENOUS

## 2020-03-08 MED ORDER — SUCCINYLCHOLINE CHLORIDE 20 MG/ML IJ SOLN
INTRAMUSCULAR | Status: DC | PRN
  Administered 2020-03-08: 80 mg via INTRAVENOUS

## 2020-03-08 MED ORDER — GLUCAGON HCL RDNA (DIAGNOSTIC) 1 MG IJ SOLR
INTRAMUSCULAR | Status: DC | PRN
  Administered 2020-03-08 (×3): .25 mg via INTRAVENOUS

## 2020-03-08 MED ORDER — FENTANYL CITRATE (PF) 100 MCG/2ML IJ SOLN: 25.0000 ug | INTRAMUSCULAR | Status: DC | PRN

## 2020-03-08 MED ORDER — LACTATED RINGERS IV SOLN: INTRAVENOUS | Status: DC

## 2020-03-08 NOTE — Anesthesia Procedure Notes (Signed)
Procedure Name: Intubation Date/Time: 03/08/2020 11:26 AM Performed by: Lance Coon, CRNA Pre-anesthesia Checklist: Patient identified, Emergency Drugs available, Suction available, Patient being monitored and Timeout performed Patient Re-evaluated:Patient Re-evaluated prior to induction Oxygen Delivery Method: Circle system utilized Preoxygenation: Pre-oxygenation with 100% oxygen Induction Type: IV induction, Rapid sequence and Cricoid Pressure applied Laryngoscope Size: Miller and 2 Grade View: Grade I Tube type: Oral Tube size: 7.0 mm Number of attempts: 1 Airway Equipment and Method: Stylet Placement Confirmation: ETT inserted through vocal cords under direct vision,  positive ETCO2 and breath sounds checked- equal and bilateral Secured at: 21 cm Tube secured with: Tape Dental Injury: Teeth and Oropharynx as per pre-operative assessment

## 2020-03-08 NOTE — Op Note (Signed)
[facility] Patient Name: Caitlin Peters Procedure Date : 03/08/2020 MRN: [id number] Attending MD: Justice Britain , MD Date of Birth: 02/19/1961 CSN: [id number] Age: 59 Admit Type: Inpatient Procedure:                Upper EUS Indications:              Common bile duct dilation (acquired) seen on MRCP,                            Dilated pancreatic duct on MRCP, Elevated liver                            enzymes Providers:                Justice Britain, MD, Carlyn Reichert, RN, Cherylynn Ridges, Technician, Lance Coon, CRNA Referring MD:             Thornton Park MD, MD, [facility] Medicines:                General Anesthesia Complications:            No immediate complications. Estimated Blood Loss:     Estimated blood loss was minimal. Procedure:                Pre-Anesthesia Assessment:                           - Prior to the procedure, a History and Physical                            was performed, and patient medications and                            allergies were reviewed. [name] tolerance of                            previous anesthesia was also reviewed. The risks                            and benefits of the procedure and the sedation                            options and risks were discussed with the patient.                            All questions were answered, and informed consent                            was obtained. Prior Anticoagulants: The patient has                            taken Lovenox (enoxaparin), last dose was 1 day  prior to procedure. ASA Grade Assessment: III - A                            patient with severe systemic disease. After                            reviewing the risks and benefits, the patient was                            deemed in satisfactory condition to undergo the                            procedure.                           After  obtaining informed consent, the endoscope was                            passed under direct vision. Throughout the                            procedure, the patient's blood pressure, pulse, and                            oxygen saturations were monitored continuously. The                            [id number]-[id number] ([id number]) Olympus gastroscope was                            introduced through the mouth, and advanced to the                            second part of duodenum. The [id number] ([id number])                            Olympus Linear EUS was introduced through the                            mouth, and advanced to the duodenum for ultrasound                            examination from the stomach and duodenum. The                            upper EUS was accomplished without difficulty. The                            patient tolerated the procedure. Scope In: Scope Out: Findings:      ENDOSCOPIC FINDING: :      No gross lesions were noted in the entire esophagus.      A J-shaped deformity was found in the gastric body and in the gastric       antrum.      Patchy  mildly erythematous mucosa without bleeding was found in the       entire examined stomach. Biopsies were taken with a cold forceps for       histology and Helicobacter pylori testing.      A mild angulation deformity was found in the duodenal bulb, in the first       portion of the duodenum and in the second portion of the duodenum.      There was evidence of a patent previous sphincterotomy at the ampulla.      ENDOSONOGRAPHIC FINDING: :      There was dilation in the common bile duct which was 5.9 mm. The CHD was       4.3 mm. Bile duct debris/stones were not clearly delineated.      The pancreatic duct had a dilated endosonographic appearance in the       pancreatic head (4.3 mm), genu of the pancreas (4.6 mm), body of the       pancreas (2.3 mm) and tail of the pancreas (2.0 mm). There was some mild       side branch  dilation in the body.      There was no sign of masses, cysts, calcifications on endosonographic       evaluation in the pancreatic head, genu of the pancreas, pancreatic body       and pancreatic tail.      Endosonographic imaging of the ampulla showed no intramural       (subepithelial) lesion.      Endosonographic imaging in the visualized portion of the liver showed no       mass.      No malignant-appearing lymph nodes were visualized in the celiac region       (level 20), peripancreatic region and porta hepatis region.      The celiac region was visualized. Impression:               EGD Impression:                           - No gross lesions in esophagus.                           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                           - Erythematous mucosa in the stomach. Biopsied.                           - Duodenal angulation deformity.                           - Patent previous sphincterotomy was found at                            ampulla.                           EUS Impression:                           -  There was mild dilation in the common bile duct.                            No stones/sludge noted.                           - The pancreatic duct had a dilated endosonographic                            appearance in the pancreatic head, genu of the                            pancreas, body of the pancreas and tail of the                            pancreas. Mild SB dilation noted in the body.                           - There was no sign of significant pathology in the                            pancreatic head, genu of the pancreas, pancreatic                            body and pancreatic tail otherwise in parenchymal                            evaluation.                           - No malignant-appearing lymph nodes were                            visualized in the celiac region (level  20),                            peripancreatic region and porta hepatis region. Recommendation:           - Proceed to scheduled ERCP.                           - Observe patient's clinical course.                           - Consider repeat MRI/MRCP in 49-month to follow                            up her stable Pancreatic Duct dilation.                           - Await path results.                           - The findings and recommendations were discussed  with the patient.                           - The findings and recommendations were discussed                            with the referring physician. Procedure Code(s):        --- Professional ---                           [phone], Esophagogastroduodenoscopy, flexible,                            transoral; with endoscopic ultrasound examination                            limited to the esophagus, stomach or duodenum, and                            adjacent structures                           43239, Esophagogastroduodenoscopy, flexible,                            transoral; with biopsy, single or multiple Diagnosis Code(s):        --- Professional ---                           K31.89, Other diseases of stomach and duodenum                           Z98.0, Intestinal bypass and anastomosis status                           K83.8, Other specified diseases of biliary tract                           I89.9, Noninfective disorder of lymphatic vessels                            and lymph nodes, unspecified                           K86.89, Other specified diseases of pancreas                           R74.8, Abnormal levels of other serum enzymes                           R93.3, Abnormal findings on diagnostic imaging of                            other parts of digestive tract CPT copyright 2019 [employer]. All rights reserved. The codes documented in this report are preliminary and upon coder  review may  be revised to meet current compliance requirements.  Justice Britain, MD 03/08/2020 6:43:48 PM Number of Addenda: 0

## 2020-03-08 NOTE — Transfer of Care (Signed)
Immediate Anesthesia Transfer of Care Note  Patient: Caitlin Peters  Procedure(s) Performed: ENDOSCOPIC RETROGRADE CHOLANGIOPANCREATOGRAPHY (ERCP) (N/A ) UPPER ENDOSCOPIC ULTRASOUND (EUS) LINEAR (N/A ) ESOPHAGOGASTRODUODENOSCOPY (EGD) (N/A ) BIOPSY BILIARY DILATION REMOVAL OF STONES  Patient Location: Endoscopy Unit  Anesthesia Type:General  Level of Consciousness: drowsy and patient cooperative  Airway & Oxygen Therapy: Patient Spontanous Breathing  Post-op Assessment: Report given to RN and Post -op Vital signs reviewed and stable  Post vital signs: Reviewed and stable  Last Vitals:  Vitals Value Taken Time  BP 192/73 03/08/20 1329  Temp    Pulse 70 03/08/20 1332  Resp 15 03/08/20 1332  SpO2 99 % 03/08/20 1332  Vitals shown include unvalidated device data.  Last Pain:  Vitals:   03/08/20 1010  TempSrc: Oral  PainSc: 7       Patients Stated Pain Goal: 0 (19/15/50 2714)  Complications: No apparent anesthesia complications

## 2020-03-08 NOTE — Anesthesia Postprocedure Evaluation (Signed)
Anesthesia Post Note  Patient: Caitlin Peters  Procedure(s) Performed: ENDOSCOPIC RETROGRADE CHOLANGIOPANCREATOGRAPHY (ERCP) (N/A ) UPPER ENDOSCOPIC ULTRASOUND (EUS) LINEAR (N/A ) ESOPHAGOGASTRODUODENOSCOPY (EGD) (N/A ) BIOPSY BILIARY DILATION REMOVAL OF STONES     Patient location during evaluation: PACU Anesthesia Type: General Level of consciousness: awake and alert, oriented and patient cooperative Pain management: pain level controlled Vital Signs Assessment: post-procedure vital signs reviewed and stable Respiratory status: spontaneous breathing, nonlabored ventilation and respiratory function stable Cardiovascular status: blood pressure returned to baseline and stable Postop Assessment: no apparent nausea or vomiting Anesthetic complications: no    Last Vitals:  Vitals:   03/08/20 1339 03/08/20 1349  BP: (!) 190/71 (!) 195/69  Pulse: 68 68  Resp: 15 13  Temp:    SpO2: 96% 98%    Last Pain:  Vitals:   03/08/20 1010  TempSrc: Oral  PainSc: [place]

## 2020-03-08 NOTE — Anesthesia Preprocedure Evaluation (Signed)
Anesthesia Evaluation  Patient identified by MRN, date of birth, ID band Patient awake    Reviewed: Allergy & Precautions, NPO status , Patient's Chart, lab work & pertinent test results, reviewed documented beta blocker date and time   History of Anesthesia Complications Negative for: history of anesthetic complications  Airway Mallampati: II  TM Distance: >3 FB Neck ROM: Full    Dental no notable dental hx. (+) Dental Advisory Given   Pulmonary neg pulmonary ROS, former smoker,    Pulmonary exam normal breath sounds clear to auscultation       Cardiovascular hypertension, Pt. on medications and Pt. on home beta blockers Normal cardiovascular exam Rhythm:Regular Rate:Normal     Neuro/Psych PSYCHIATRIC DISORDERS Depression negative neurological ROS     GI/Hepatic GERD  Medicated and Controlled,(+) Hepatitis -, UnspecifiedAbnormal LFTs, hx biliary sludge/bacteremia  Crohn's Dx, short gut syndrome, pancreatitis   Endo/Other  negative endocrine ROS  Renal/GU Renal InsufficiencyRenal diseaseCKD 3, Cr 1.64  negative genitourinary   Musculoskeletal Chronic pain- on mathadone  Hx AVN   Abdominal Normal abdominal exam  (+)   Peds  Hematology  (+) Blood dyscrasia, anemia , H/H 8.7/26.6, plt 101   Anesthesia Other Findings Chronic malnutrition   Reproductive/Obstetrics negative OB ROS                             Anesthesia Physical Anesthesia Plan  ASA: III  Anesthesia Plan: General   Post-op Pain Management:    Induction: Intravenous, Rapid sequence and Cricoid pressure planned  PONV Risk Score and Plan: 4 or greater and Ondansetron, Dexamethasone, Midazolam and Treatment may vary due to age or medical condition  Airway Management Planned: Oral ETT  Additional Equipment: None  Intra-op Plan:   Post-operative Plan: Extubation in OR  Informed Consent: I have reviewed the patients  History and Physical, chart, labs and discussed the procedure including the risks, benefits and alternatives for the proposed anesthesia with the patient or authorized representative who has indicated his/her understanding and acceptance.     Dental advisory given  Plan Discussed with: CRNA  Anesthesia Plan Comments:         Anesthesia Quick Evaluation

## 2020-03-08 NOTE — Interval H&P Note (Signed)
History and Physical Interval Note:  03/08/2020 11:01 AM  Caitlin Peters  has presented today for surgery, with the diagnosis of Abnormal LFTs, hx of Biliary sludge, Hx of bacteremia, Dilated PD.  The various methods of treatment have been discussed with the patient and family. After consideration of risks, benefits and other options for treatment, the patient has consented to  Procedure(s): ENDOSCOPIC RETROGRADE CHOLANGIOPANCREATOGRAPHY (ERCP) (N/A) UPPER ENDOSCOPIC ULTRASOUND (EUS) LINEAR (N/A) as a surgical intervention.  [name] history has been reviewed, patient examined, no change in status, stable for surgery.  I have reviewed the patient's chart and labs.  Questions were answered to the patient's satisfaction.    The risks of EUS including bleeding, infection, aspiration pneumonia and intestinal perforation were discussed as was the possibility it may not give a definitive diagnosis.  If a biopsy of the pancreas is done as part of the EUS, there is an additional risk of pancreatitis at the rate of about 1%.  It was explained that procedure related pancreatitis is typically mild, although can be severe and even life threatening, which is why we do not perform random pancreatic biopsies and only biopsy a lesion we feel is concerning enough to warrant the risk.  The risks of an ERCP were discussed at length, including but not limited to the risk of perforation, bleeding, abdominal pain, post-ERCP pancreatitis (while usually mild can be severe and even life threatening).   [employer]

## 2020-03-08 NOTE — Op Note (Signed)
[facility] Patient Name: Caitlin Peters Procedure Date : 03/08/2020 MRN: [id number] Attending MD: Justice Britain , MD Date of Birth: [birth year] CSN: [id number] Age: 59 Admit Type: Inpatient Procedure:                ERCP Indications:              Evaluation and possible treatment of bile duct                            stone(s), Exclusion of ascending cholangitis,                            Abnormal liver function test, Prior Endoscopic                            Retrograde Cholangiopancreatography, History of                            previous biliary sludge s/p ERCP in setting of GNR                            bacteremia - query recurrent nature in sigmoidized                            CBD Providers:                Justice Britain, MD, Carlyn Reichert, RN, Cherylynn Ridges, Technician, Lance Coon, CRNA Referring MD:             Thornton Park MD, MD, [facility] Medicines:                General Anesthesia, Zosyn 3.375 g, Indomethacin 100                            mg PR Complications:            No immediate complications. Estimated Blood Loss:     Estimated blood loss was minimal. Procedure:                Pre-Anesthesia Assessment:                           - Prior to the procedure, a History and Physical                            was performed, and patient medications and                            allergies were reviewed. [name] tolerance of                            previous anesthesia was also reviewed. The risks                            and benefits  of the procedure and the sedation                            options and risks were discussed with the patient.                            All questions were answered, and informed consent                            was obtained. Prior Anticoagulants: The patient has                            taken Lovenox (enoxaparin), last dose was 1 day         prior to procedure. ASA Grade Assessment: III - A                            patient with severe systemic disease. After                            reviewing the risks and benefits, the patient was                            deemed in satisfactory condition to undergo the                            procedure.                           - Prior to the procedure, a History and Physical                            was performed, and patient medications and                            allergies were reviewed. [name] tolerance of                            previous anesthesia was also reviewed. The risks                            and benefits of the procedure and the sedation                            options and risks were discussed with the patient.                            All questions were answered, and informed consent                            was obtained. Prior Anticoagulants: The patient has                            taken Lovenox (enoxaparin), last dose was 1 day  prior to procedure. ASA Grade Assessment: III - A                            patient with severe systemic disease. After                            reviewing the risks and benefits, the patient was                            deemed in satisfactory condition to undergo the                            procedure.                           After obtaining informed consent, the scope was                            passed under direct vision. Throughout the                            procedure, the patient's blood pressure, pulse, and                            oxygen saturations were monitored continuously. The                            [id number]-Q180V ([id number] Olympus Duodensocope was                            introduced through the mouth, and used to inject                            contrast into and used to inject contrast into the                            bile duct. The ERCP was  accomplished without                            difficulty. The patient tolerated the procedure. Scope In: Scope Out: Findings:      A scout film of the abdomen was obtained. Surgical clips, consistent       with a previous cholecystectomy, were seen in the area of the right       upper quadrant of the abdomen.      The esophagus was successfully intubated under direct vision without       detailed examination of the pharynx, larynx, and associated structures,       and upper GI tract. A biliary sphincterotomy had been performed. The       sphincterotomy appeared open.      A short 0.035 inch Soft Jagwire was passed into the biliary tree. The       Autotome sphincterotome was passed over the guidewire and the bile duct       was then deeply cannulated. Contrast was injected. I personally  interpreted the bile duct images. Ductal flow of contrast was adequate.       Image quality was adequate. Contrast extended to the hepatic ducts.       Opacification of the entire biliary tree except for the gallbladder was       successful. The main bile duct was mildly dilated and was sigmoid       featured. The largest diameter was 11 mm. Dilation of the common bile       duct with a 09-06-11 mm balloon (to a maximum balloon size of 12 mm)       dilator was successful as a sphincteroplasty for 4 minutes total. To       discover objects, the biliary tree was swept with a retrieval balloon       starting at the bifurcation. Nothing was found. An occlusion       cholangiogram was performed that showed no further significant biliary       pathology and did show an improvement for a partial time period of the       sigmoidized biliary tree.      A pancreatogram was not performed.      The duodenoscope was withdrawn from the patient. Impression:               - Prior biliary sphincterotomy appeared open.                           - The entire main bile duct was mildly dilated.                            - Sphincteroplasty performed.                           - The biliary tree was swept and nothing was found. Recommendation:           - The patient will be observed post-procedure,                            until all discharge criteria are met.                           - Return patient to hospital ward for ongoing care.                           - Check liver enzymes (AST, ALT, alkaline                            phosphatase, bilirubin) in the morning.                           - Observe patient's clinical course.                           - Watch for pancreatitis, bleeding, perforation,                            and cholangitis.                           -  Recommend no chemical VTE/DVT prophylaxis for 48                            hours in order to decrease risk of                            post-sphincterotomy bleeding. If anticoagulation is                            necessary, then would recommend heparin drip                            without bolus in 12-hours.                           - The findings and recommendations were discussed                            with the patient.                           - The findings and recommendations were discussed                            with the referring physician. Procedure Code(s):        --- Professional ---                           [phone], Endoscopic retrograde                            cholangiopancreatography (ERCP); with                            trans-endoscopic balloon dilation of                            biliary/pancreatic duct(s) or of ampulla                            (sphincteroplasty), including sphincterotomy, when                            performed, each duct Diagnosis Code(s):        --- Professional ---                           K80.50, Calculus of bile duct without cholangitis                            or cholecystitis without obstruction                           R94.5, Abnormal results of liver  function studies                           Z98.890, Other specified postprocedural states  K83.8, Other specified diseases of biliary tract CPT copyright 2019 [employer]. All rights reserved. The codes documented in this report are preliminary and upon coder review may  be revised to meet current compliance requirements. Justice Britain, MD 03/08/2020 6:17:52 PM Number of Addenda: 0

## 2020-03-08 NOTE — Progress Notes (Signed)
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  Assessment & Plan:   Principal Problem:   Sepsis ([place]) Active Problems:   Crohn disease ([place])   Short gut syndrome   HTN (hypertension)   Depression   Chronic pain syndrome   On total parenteral nutrition (TPN)   Bacteremia   Elevated LFTs   Pancytopenia ([name])   Stage 3b chronic kidney disease  Sepsis with stenotrophomonas bacteremia present on admit -Pt presented with Leukopenia, fever, tachycardia, tachypnea  -Suspected source is either GI/Crohn's colitis or line infection -Pt was started on IV Aztreonam/Flagyl/Vanc. ID consulted -Blood cx pos for stenotrophomonas -Per ID, d/c above abx and transition to IV bactrim. -ID initially recommended d/c PICC, repeat blood cx and if blood cx neg, then place new PICC at that time. Discussed with IR. Patient is known to have very poor IV access with current line requiring angioplasty to be placed -Given very limited IV access, discussed with ID who now recommends change out PICC and to treat with 21 days of IV bactrim -Pending IR following. 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 -Pt underwent EGD and ERCP on 4/13. Discussed with GI. Findings of dilated biliary ducts with no mass, unremarkable ERCP. Per GI, ok to  advance to low fat diet after PICC today  HTN -Continue Norvasc, Metoprolol as tolerated  Chronic pain syndrome -Takes home Methadone and Dilaudid -Dr. Lorin Mercy reviewed this patient in the [place] Controlled Substances Reporting System.  She is receiving medications from only one provider and appears to be taking them as prescribed. -Continue with analgesics as tolerated  Depression -Continue Wellbutrin and Trazodone as tolerated  Pancytopenia -WBC improved, now over 100k -Possible resultant of presenting septic picture  Stage 3b CKD -Appears to be at/near baseline at this time -Had ATN during her prior hospitalization  -recheck bmet in AM   DVT prophylaxis: Lovenox subq Code Status: Full Family Communication: Pt in room, family not at bedside  Status is: Inpatient  Remains inpatient appropriate because:IV treatments appropriate due to intensity of illness or inability to take PO   Dispo: The patient is from: Home              Anticipated d/c is to: Home              Anticipated d/c date is: > 3 days              Patient currently is not medically stable to d/c.        Consultants:   Infectious disease  GI  IR  Procedures:   EGD and ERCP 4/13  Antimicrobials: Anti-infectives (From admission, onward)   Start     Dose/Rate Route Frequency Ordered Stop   03/08/20 1145  vancomycin (VANCOCIN) IVPB 1000 mg/200 mL premix     1,000 mg 200 mL/hr over 60 Minutes Intravenous To Radiology 03/07/20 1728 03/09/20 1145  03/08/20 1145  piperacillin-tazobactam (ZOSYN) IVPB 3.375 g     3.375 g 100 mL/hr over 30 Minutes Intravenous  Once 03/08/20 1143 03/08/20 1147   03/06/20 1400  sulfamethoxazole-trimethoprim (BACTRIM) 320 mg in dextrose 5 % 500 mL IVPB     320 mg 346.7 mL/hr over 90 Minutes Intravenous Every 8 hours 03/06/20 1351     03/05/20 1130  metroNIDAZOLE (FLAGYL) IVPB 500 mg  Status:  Discontinued     500 mg 100 mL/hr over 60 Minutes Intravenous Every 8  hours 03/05/20 1111 03/06/20 1351   03/05/20 1000  aztreonam (AZACTAM) 1 g in sodium chloride 0.9 % 100 mL IVPB  Status:  Discontinued     1 g 200 mL/hr over 30 Minutes Intravenous Every 8 hours 03/05/20 0738 03/06/20 1351   03/05/20 1000  vancomycin (VANCOREADY) IVPB 750 mg/150 mL  Status:  Discontinued     750 mg 150 mL/hr over 60 Minutes Intravenous Every 24 hours 03/05/20 0738 03/06/20 1102   03/05/20 0045  aztreonam (AZACTAM) 2 g in sodium chloride 0.9 % 100 mL IVPB     2 g 200 mL/hr over 30 Minutes Intravenous  Once 03/05/20 0031 03/05/20 0158   03/05/20 0045  metroNIDAZOLE (FLAGYL) IVPB 500 mg     500 mg 100 mL/hr over 60 Minutes Intravenous  Once 03/05/20 0031 03/05/20 0346   03/05/20 0045  vancomycin (VANCOCIN) IVPB 1000 mg/200 mL premix     1,000 mg 200 mL/hr over 60 Minutes Intravenous  Once 03/05/20 0031 03/05/20 0450      Subjective: Seen after EGD and ERCP. Pt remains sedated  Objective: Vitals:   03/08/20 1010 03/08/20 1329 03/08/20 1339 03/08/20 1349  BP: (!) 191/73 (!) 192/73 (!) 190/71 (!) 195/69  Pulse: 61 69 68 68  Resp: 17 14 15 13   Temp: 98.2 F (36.8 C)     TempSrc: Oral     SpO2: 100% 98% 96% 98%  Weight: 66.9 kg     Height: 5' 8"  (1.727 m)       Intake/Output Summary (Last 24 hours) at 03/08/2020 1451 Last data filed at 03/08/2020 1326 Gross per 24 hour  Intake 1250 ml  Output --  Net 1250 ml   Filed Weights   03/04/20 2347 03/05/20 1223 03/08/20 1010  Weight: 63.5 kg 66.9 kg 66.9 kg    Examination: General exam: Asleep, laying in bed, in nad Respiratory system: Normal respiratory effort, no wheezing Cardiovascular system: regular rate, s1, s2 Gastrointestinal system: Soft, nondistended, positive BS Central nervous system: CN2-12 grossly intact, strength intact Extremities: Perfused, no clubbing Skin: Normal skin turgor, no notable skin lesions seen Psychiatry: unable to assess as pt remains sedated   Data Reviewed: I have personally  reviewed following labs and imaging studies  CBC: Recent Labs  Lab 03/05/20 0048 03/06/20 0623 03/07/20 0508  WBC 1.5* 5.9 5.3  NEUTROABS 1.3* 4.9 3.8  HGB 9.9* 9.4* 8.7*  HCT 30.6* 29.6* 26.6*  MCV 95.9 94.3 92.7  PLT 123* 97* 701*   Basic Metabolic Panel: Recent Labs  Lab 03/05/20 0048 03/06/20 0623 03/07/20 0508 03/08/20 0612  NA 140 138 136 136  K 3.8 4.0 4.0 3.8  CL 104 109 106 106  CO2 26 20* 21* 23  GLUCOSE 79 68* 105* 93  BUN 39* 26* 16 11  CREATININE 1.58* 1.41* 1.53* 1.64*  CALCIUM 9.1 8.9 9.1 9.1  MG  --  1.9 1.8  --   PHOS  --  2.6 3.0  --  GFR: Estimated Creatinine Clearance: 37.7 mL/min (A) (by C-G formula based on SCr of 1.64 mg/dL (H)). Liver Function Tests: Recent Labs  Lab 03/05/20 0048 03/06/20 0623 03/07/20 0508 03/08/20 0612  AST 884* 556* 243* 122*  ALT 607* 912* 645* 460*  ALKPHOS 253* 285* 262* 270*  BILITOT 1.0 2.4* 1.4* 1.1  PROT 6.8 6.4* 6.3* 6.4*  ALBUMIN 3.1* 2.6* 2.7* 2.7*   No results for input(s): LIPASE, AMYLASE in the last 168 hours. No results for input(s): AMMONIA in the last 168 hours. Coagulation Profile: Recent Labs  Lab 03/05/20 0048  INR 1.0   Cardiac Enzymes: Recent Labs  Lab 03/05/20 0048  CKTOTAL 67   BNP (last 3 results) No results for input(s): PROBNP in the last 8760 hours. HbA1C: No results for input(s): HGBA1C in the last 72 hours. CBG: No results for input(s): GLUCAP in the last 168 hours. Lipid Profile: Recent Labs    03/06/20 0623 03/07/20 0508  TRIG 310* 88   Thyroid Function Tests: No results for input(s): TSH, T4TOTAL, FREET4, T3FREE, THYROIDAB in the last 72 hours. Anemia Panel: No results for input(s): VITAMINB12, FOLATE, FERRITIN, TIBC, IRON, RETICCTPCT in the last 72 hours. Sepsis Labs: Recent Labs  Lab 03/05/20 0048 03/05/20 1405  PROCALCITON  --  84.79  LATICACIDVEN 1.4  --     Recent Results (from the past 240 hour(s))  Respiratory Panel by RT PCR (Flu A&B, Covid)  - Nasopharyngeal Swab     Status: None   Collection Time: 03/05/20 12:48 AM   Specimen: Nasopharyngeal Swab  Result Value Ref Range Status   SARS Coronavirus 2 by RT PCR NEGATIVE NEGATIVE Final    Comment: (NOTE) SARS-CoV-2 target nucleic acids are NOT DETECTED. The SARS-CoV-2 RNA is generally detectable in upper respiratoy specimens during the acute phase of infection. The lowest concentration of SARS-CoV-2 viral copies this assay can detect is 131 copies/mL. A negative result does not preclude SARS-Cov-2 infection and should not be used as the sole basis for treatment or other patient management decisions. A negative result may occur with  improper specimen collection/handling, submission of specimen other than nasopharyngeal swab, presence of viral mutation(s) within the areas targeted by this assay, and inadequate number of viral copies (<131 copies/mL). A negative result must be combined with clinical observations, patient history, and epidemiological information. The expected result is Negative. Fact Sheet for Patients:  PinkCheek.be Fact Sheet for Healthcare Providers:  GravelBags.it This test is not yet ap proved or cleared by the [place] FDA and  has been authorized for detection and/or diagnosis of SARS-CoV-2 by FDA under an Emergency Use Authorization (EUA). This EUA will remain  in effect (meaning this test can be used) for the duration of the COVID-19 declaration under Section 564(b)(1) of the Act, 21 U.S.C. section 360bbb-3(b)(1), unless the authorization is terminated or revoked sooner.    Influenza A by PCR NEGATIVE NEGATIVE Final   Influenza B by PCR NEGATIVE NEGATIVE Final    Comment: (NOTE) The Xpert Xpress SARS-CoV-2/FLU/RSV assay is intended as an aid in  the diagnosis of influenza from Nasopharyngeal swab specimens and  should not be used as a sole basis for treatment. Nasal washings and    aspirates are unacceptable for Xpert Xpress SARS-CoV-2/FLU/RSV  testing. Fact Sheet for Patients: PinkCheek.be Fact Sheet for Healthcare Providers: GravelBags.it This test is not yet approved or cleared by the [place] FDA and  has been authorized for detection and/or diagnosis of SARS-CoV-2 by  FDA under an  Emergency Use Authorization (EUA). This EUA will remain  in effect (meaning this test can be used) for the duration of the  Covid-19 declaration under Section 564(b)(1) of the Act, 21  U.S.C. section 360bbb-3(b)(1), unless the authorization is  terminated or revoked. Performed at [facility], [place] [address] [place], [place] [address]   Blood Culture (routine x 2)     Status: None (Preliminary result)   Collection Time: 03/05/20 12:51 AM   Specimen: BLOOD  Result Value Ref Range Status   Specimen Description BLOOD RIGHT WRIST  Final   Special Requests   Final    BOTTLES DRAWN AEROBIC AND ANAEROBIC Blood Culture results may not be optimal due to an inadequate volume of blood received in culture bottles   Culture   Final    NO GROWTH 3 DAYS Performed at [facility], [place] [address], [place], [place] [address]    Report Status PENDING  Incomplete  Blood Culture (routine x 2)     Status: Abnormal   Collection Time: 03/05/20  1:06 AM   Specimen: BLOOD  Result Value Ref Range Status   Specimen Description BLOOD LEFT FEMORAL ARTERY  Final   Special Requests   Final    BOTTLES DRAWN AEROBIC AND ANAEROBIC Blood Culture results may not be optimal due to an excessive volume of blood received in culture bottles   Culture  Setup Time   Final    GRAM NEGATIVE RODS IN BOTH AEROBIC AND ANAEROBIC BOTTLES CRITICAL RESULT CALLED TO, READ BACK BY AND VERIFIED WITH: PHARMD K HURTH [id number] AT 1726 BY CM Performed at [facility], [place] [address], [place], [place] [address]    Culture STENOTROPHOMONAS MALTOPHILIA  (A)  Final   Report Status 03/07/2020 FINAL  Final   Organism ID, Bacteria STENOTROPHOMONAS MALTOPHILIA  Final      Susceptibility   Stenotrophomonas maltophilia - MIC*    LEVOFLOXACIN 0.5 SENSITIVE Sensitive     TRIMETH/SULFA <=20 SENSITIVE Sensitive     * STENOTROPHOMONAS MALTOPHILIA  Blood Culture ID Panel (Reflexed)     Status: None   Collection Time: 03/05/20  1:06 AM  Result Value Ref Range Status   Enterococcus species NOT DETECTED NOT DETECTED Final   Listeria monocytogenes NOT DETECTED NOT DETECTED Final   Staphylococcus species NOT DETECTED NOT DETECTED Final   Staphylococcus aureus (BCID) NOT DETECTED NOT DETECTED Final   Streptococcus species NOT DETECTED NOT DETECTED Final   Streptococcus agalactiae NOT DETECTED NOT DETECTED Final   Streptococcus pneumoniae NOT DETECTED NOT DETECTED Final   Streptococcus pyogenes NOT DETECTED NOT DETECTED Final   Acinetobacter baumannii NOT DETECTED NOT DETECTED Final   Enterobacteriaceae species NOT DETECTED NOT DETECTED Final   Enterobacter cloacae complex NOT DETECTED NOT DETECTED Final   Escherichia coli NOT DETECTED NOT DETECTED Final   Klebsiella oxytoca NOT DETECTED NOT DETECTED Final   Klebsiella pneumoniae NOT DETECTED NOT DETECTED Final   Proteus species NOT DETECTED NOT DETECTED Final   Serratia marcescens NOT DETECTED NOT DETECTED Final   Haemophilus influenzae NOT DETECTED NOT DETECTED Final   Neisseria meningitidis NOT DETECTED NOT DETECTED Final   Pseudomonas aeruginosa NOT DETECTED NOT DETECTED Final   Candida albicans NOT DETECTED NOT DETECTED Final   Candida glabrata NOT DETECTED NOT DETECTED Final   Candida krusei NOT DETECTED NOT DETECTED Final   Candida parapsilosis NOT DETECTED NOT DETECTED Final   Candida tropicalis NOT DETECTED NOT DETECTED Final    Comment: Performed  at [facility], [place] [address] [place], [place] [address]  Urine culture     Status: Abnormal   Collection Time: 03/05/20  8:05 AM    Specimen: In/Out Cath Urine  Result Value Ref Range Status   Specimen Description IN/OUT CATH URINE  Final   Special Requests   Final    NONE Performed at [facility], [place] [address], [place], [place] [address]    Culture 30,000 COLONIES/mL STAPHYLOCOCCUS EPIDERMIDIS (A)  Final   Report Status 03/07/2020 FINAL  Final   Organism ID, Bacteria STAPHYLOCOCCUS EPIDERMIDIS (A)  Final      Susceptibility   Staphylococcus epidermidis - MIC*    CIPROFLOXACIN 4 RESISTANT Resistant     GENTAMICIN <=0.5 SENSITIVE Sensitive     NITROFURANTOIN <=16 SENSITIVE Sensitive     OXACILLIN >=4 RESISTANT Resistant     TETRACYCLINE >=16 RESISTANT Resistant     TRIMETH/SULFA <=10 SENSITIVE Sensitive     CLINDAMYCIN >=8 RESISTANT Resistant     RIFAMPIN 1 SENSITIVE Sensitive     Inducible Clindamycin NEGATIVE Sensitive     * 30,000 COLONIES/mL STAPHYLOCOCCUS EPIDERMIDIS     Radiology Studies: MR ABDOMEN MRCP WO CONTRAST  Result Date: 03/07/2020 CLINICAL DATA:  59 year old female with history of jaundice. Abnormal liver enzymes. EXAM: MRI ABDOMEN WITHOUT CONTRAST  (INCLUDING MRCP) TECHNIQUE: Multiplanar multisequence MR imaging of the abdomen was performed. Heavily T2-weighted images of the biliary and pancreatic ducts were obtained, and three-dimensional MRCP images were rendered by post processing. COMPARISON:  CT the abdomen and pelvis 03/05/2020. FINDINGS: Comment: Today's study is limited for detection and characterization of visceral and/or vascular lesions by lack of IV gadolinium. There is also considerable respiratory motion on many of the pulse sequences. Lower chest: Cardiomegaly. Trace right pleural effusion lying dependently. Hepatobiliary: No definite suspicious cystic or solid hepatic lesions are confidently identified on today's noncontrast examination. MRCP images demonstrate very mild prominence of the intrahepatic bile ducts. Common bile duct is currently normal in size measuring up to 6 mm  in the porta hepatis. The distal common bile duct immediately before the level of the ampulla is not visualized. No definite filling defect in the common bile duct to clearly indicate the presence of choledocholithiasis. Status post cholecystectomy. Pancreas: No pancreatic mass. MRCP images again demonstrate diffuse pancreatic ductal dilatation which extends to the level of the ampulla with the main pancreatic duct measuring up to 6 mm in the body and head of the pancreas. No peripancreatic fluid collections or inflammatory changes. Spleen: Spleen appears borderline enlarged measuring 11.1 x 5.0 x 10.6 cm (estimated splenic volume of 294 mL). Adrenals/Urinary Tract: Unenhanced appearance of the kidneys and bilateral adrenal glands is grossly unremarkable. No hydroureteronephrosis in the visualized portions of the abdomen. Stomach/Bowel: Moderate gaseous distension in the visualized colon. Otherwise, unremarkable. Vascular/Lymphatic: No aneurysm identified in the visualized abdominal vasculature. No lymphadenopathy noted in the abdomen. Other: No significant volume of ascites noted in the visualized portions of the peritoneal cavity. Musculoskeletal: No aggressive appearing osseous lesions are noted in the visualized portions of the skeleton. IMPRESSION: 1. Persistent diffuse pancreatic ductal dilatation, similar to prior exam from 01/02/2020. Compared to the prior study, the degree of common bile duct dilatation has decreased and is now within normal limits, although there continues to be some very mild prominence of the intrahepatic bile ducts. The distal common bile duct at and immediately proximal to the ampulla is poorly demonstrated on today's noncontrast examination. Although there is no  discrete filling defect to suggest choledocholithiasis, the lack of visualization makes it difficult to exclude the presence of biliary sludge in the distal common bile duct or obstructing ampullary lesion on today's  noncontrast examination. 2. Cardiomegaly. 3. Trace right pleural effusion lying dependently. Electronically Signed   By: Vinnie Langton M.D.   On: 03/07/2020 07:13    Scheduled Meds: . amLODipine  5 mg Oral Daily  . budesonide  9 mg Oral Daily  . buPROPion  200 mg Oral Daily  . Chlorhexidine Gluconate Cloth  6 each Topical Daily  . cycloSPORINE  1 drop Both Eyes BID  . dicyclomine  20 mg Oral TID  . docusate sodium  100 mg Oral Daily  . DULoxetine  90 mg Oral Daily  . estradiol  2 mg Oral Daily  . famotidine  20 mg Oral BID  . methadone  5 mg Oral 5 X Daily  . metoprolol tartrate  100 mg Oral BID  . pantoprazole  40 mg Oral Daily  . sodium chloride flush  10-40 mL Intracatheter Q12H  . sodium chloride flush  3 mL Intravenous Q12H  . sucralfate  1 g Oral TID WC & HS   Continuous Infusions: . dextrose 5% lactated ringers 100 mL/hr at 03/07/20 1745  . sulfamethoxazole-trimethoprim 320 mg (03/08/20 1224)  . vancomycin       LOS: 3 days   Marylu Lund, MD [facility] Pager On Amion  If 7PM-7AM, please contact night-coverage 03/08/2020, 2:51 PM

## 2020-03-09 ENCOUNTER — Encounter: Payer: Self-pay | Admitting: *Deleted

## 2020-03-09 ENCOUNTER — Other Ambulatory Visit: Payer: Self-pay | Admitting: Physician Assistant

## 2020-03-09 ENCOUNTER — Inpatient Hospital Stay (HOSPITAL_COMMUNITY): Payer: Medicare Other

## 2020-03-09 DIAGNOSIS — Z9889 Other specified postprocedural states: Secondary | ICD-10-CM

## 2020-03-09 DIAGNOSIS — K7200 Acute and subacute hepatic failure without coma: Secondary | ICD-10-CM

## 2020-03-09 DIAGNOSIS — R7881 Bacteremia: Secondary | ICD-10-CM | POA: Diagnosis not present

## 2020-03-09 DIAGNOSIS — R945 Abnormal results of liver function studies: Secondary | ICD-10-CM | POA: Diagnosis not present

## 2020-03-09 DIAGNOSIS — R6520 Severe sepsis without septic shock: Secondary | ICD-10-CM

## 2020-03-09 DIAGNOSIS — A419 Sepsis, unspecified organism: Secondary | ICD-10-CM | POA: Diagnosis not present

## 2020-03-09 DIAGNOSIS — N179 Acute kidney failure, unspecified: Secondary | ICD-10-CM

## 2020-03-09 DIAGNOSIS — G894 Chronic pain syndrome: Secondary | ICD-10-CM | POA: Diagnosis not present

## 2020-03-09 DIAGNOSIS — G9340 Encephalopathy, unspecified: Secondary | ICD-10-CM

## 2020-03-09 HISTORY — PX: IR FLUORO GUIDE CV LINE LEFT: IMG2282

## 2020-03-09 LAB — BASIC METABOLIC PANEL
Anion gap: 11 (ref 5–15)
BUN: 21 mg/dL — ABNORMAL HIGH (ref 6–20)
CO2: 17 mmol/L — ABNORMAL LOW (ref 22–32)
Calcium: 8.5 mg/dL — ABNORMAL LOW (ref 8.9–10.3)
Chloride: 106 mmol/L (ref 98–111)
Creatinine, Ser: 2.05 mg/dL — ABNORMAL HIGH (ref 0.44–1.00)
GFR calc Af Amer: 30 mL/min — ABNORMAL LOW (ref >60)
GFR calc non Af Amer: 26 mL/min — ABNORMAL LOW (ref >60)
Glucose, Bld: 70 mg/dL (ref 70–99)
Potassium: 3.4 mmol/L — ABNORMAL LOW (ref 3.5–5.1)
Sodium: 134 mmol/L — ABNORMAL LOW (ref 135–145)

## 2020-03-09 LAB — SURGICAL PATHOLOGY

## 2020-03-09 MED ORDER — LIDOCAINE HCL 1 % IJ SOLN
INTRAMUSCULAR | Status: AC
  Filled 2020-03-09: qty 20

## 2020-03-09 MED ORDER — MIDAZOLAM HCL 2 MG/2ML IJ SOLN
INTRAMUSCULAR | Status: AC
  Filled 2020-03-09: qty 2

## 2020-03-09 MED ORDER — MIDAZOLAM HCL 2 MG/2ML IJ SOLN
INTRAMUSCULAR | Status: AC | PRN
  Administered 2020-03-09 (×2): 1 mg via INTRAVENOUS

## 2020-03-09 MED ORDER — VANCOMYCIN HCL IN DEXTROSE 1-5 GM/200ML-% IV SOLN
INTRAVENOUS | Status: AC
  Administered 2020-03-09: 1000 mg via INTRAVENOUS
  Filled 2020-03-09: qty 200

## 2020-03-09 MED ORDER — LIDOCAINE HCL (PF) 1 % IJ SOLN
INTRAMUSCULAR | Status: AC | PRN
  Administered 2020-03-09: 5 mL

## 2020-03-09 MED ORDER — FENTANYL CITRATE (PF) 100 MCG/2ML IJ SOLN
INTRAMUSCULAR | Status: AC | PRN
  Administered 2020-03-09 (×3): 50 ug via INTRAVENOUS

## 2020-03-09 MED ORDER — FENTANYL CITRATE (PF) 100 MCG/2ML IJ SOLN
INTRAMUSCULAR | Status: AC
  Filled 2020-03-09: qty 2

## 2020-03-09 MED ORDER — PANCRELIPASE (LIP-PROT-AMYL) 36000-114000 UNITS PO CPEP
108000.0000 [IU] | ORAL_CAPSULE | Freq: Three times a day (TID) | ORAL | Status: DC
  Administered 2020-03-10 – 2020-03-11 (×2): 108000 [IU] via ORAL
  Filled 2020-03-09 (×8): qty 3

## 2020-03-09 MED ORDER — CHLORHEXIDINE GLUCONATE 4 % EX LIQD
CUTANEOUS | Status: AC
  Filled 2020-03-09: qty 15

## 2020-03-09 NOTE — Progress Notes (Addendum)
Daily Rounding Note  03/09/2020, 5:03 PM  LOS: 4 days   SUBJECTIVE:   Chief complaint:  Short gut, Crohn's.  tpn dependent.     PICC line in left groin replaced this AM.  \ Stools loose per her norm Asked me to order creon.  She is tolerating solids.    OBJECTIVE:         Vital signs in last 24 hours:    Temp:  [98.1 F (36.7 C)-98.4 F (36.9 C)] 98.4 F (36.9 C) (04/14 1631) Pulse Rate:  [58-70] 60 (04/14 1631) Resp:  [12-20] 20 (04/14 1631) BP: (112-143)/(51-83) 124/58 (04/14 1631) SpO2:  [97 %-100 %] 98 % (04/14 1631) Last BM Date: 03/07/20 Filed Weights   03/04/20 2347 03/05/20 1223 03/08/20 1010  Weight: 63.5 kg 66.9 kg 66.9 kg   General: looks well   Heart: rrr Chest: clear bil Abdomen: soft, diffuse tenderness w/o g/r  Extremities: no CCE Neuro/Psych:  Alert, appropriate.  No confusion.  Good historian.  No tramors.     Intake/Output from previous day: 04/13 0701 - 04/14 0700 In: 1750 [I.V.:1000; IV Piggyback:750] Out: -   Intake/Output this shift: No intake/output data recorded.  Lab Results: Recent Labs    03/07/20 0508  WBC 5.3  HGB 8.7*  HCT 26.6*  PLT 101*   BMET Recent Labs    03/07/20 0508 03/08/20 0612 03/09/20 0934  NA 136 136 134*  K 4.0 3.8 3.4*  CL 106 106 106  CO2 21* 23 17*  GLUCOSE 105* 93 70  BUN 16 11 21*  CREATININE 1.53* 1.64* 2.05*  CALCIUM 9.1 9.1 8.5*   LFT Recent Labs    03/07/20 0508 03/08/20 0612  PROT 6.3* 6.4*  ALBUMIN 2.7* 2.7*  AST 243* 122*  ALT 645* 460*  ALKPHOS 262* 270*  BILITOT 1.4* 1.1   PT/INR No results for input(s): LABPROT, INR in the last 72 hours. Hepatitis Panel No results for input(s): HEPBSAG, HCVAB, HEPAIGM, HEPBIGM in the last 72 hours.  Studies/Results: IR Fluoro Guide CV Line Left  Result Date: 03/09/2020 CLINICAL DATA:  Crohn's disease, difficult peripheral access, long-term indwelling left tunneled femoral venous  catheter. The subcutaneous cuff has pulled back. Sepsis, catheter exchange requested EXAM: TUNNELED CENTRAL VENOUS CATHETER EXCHANGE/REVISION FLUOROSCOPIC GUIDANCE TECHNIQUE: The procedure, risks, benefits, and alternatives were explained to the patient. Questions regarding the procedure were encouraged and answered. The patient understands and consents to the procedure. As antibiotic prophylaxis, VANCOMYCIN 1 G was ordered pre-procedure and administered intravenously within one hour of incision. The left femoral tunneled dual-lumen central venous catheter and surrounding skin were prepped using maximum barrier technique including cap and mask, sterile gown, sterile gloves, large sterile sheet, and Chlorhexidine as cutaneous antisepsis. The region was infiltrated locally with 1% lidocaine. Intravenous Fentanyl 155mg and Versed 251mwere administered as conscious sedation during continuous monitoring of the patient's level of consciousness and physiological / cardiorespiratory status by the radiology RN, with a total moderate sedation time of 14 minutes. The catheter was cut and withdrawn over a 018 wire, over which a new 87F dual-lumen cuffed 39 cm power injectable catheter was advanced , positioned with its tip in the infrarenal IVC. Both lumens flushed and aspirated well. Spot radiograph confirms good catheter position. Catheter was flushed per protocol. Catheter secured externally with O Prolene suture. The patient tolerated the procedure well. COMPLICATIONS: COMPLICATIONS None immediate FLUOROSCOPY TIME:  6 seconds COMPARISON:  None IMPRESSION: 1. Technically  successful exchange and revision of tunneled left femoral dual-lumen power-injectable central venous catheter. Electronically Signed   By: Lucrezia Europe M.D.   On: 03/09/2020 15:49   DG ERCP  Result Date: 03/08/2020 CLINICAL DATA:  59 year old female with pancreatic ductal dilatation. EXAM: ERCP TECHNIQUE: Multiple spot images obtained with the fluoroscopic  device and submitted for interpretation post-procedure. FLUOROSCOPY TIME:  Fluoroscopy Time:  4 minutes 46 seconds COMPARISON:  None. FINDINGS: Limited intraoperative fluoroscopic spot images during ERCP. Initial image demonstrates the endoscope projecting over the upper abdomen. Subsequently there is cannulation of the ampulla with a safety wire and retrograde infusion of contrast partially opacifying the extrahepatic biliary ducts. Deployment of a retrieval balloon. There is also evidence of PTA of the distal common bile duct/ampulla Surgical changes of cholecystectomy. IMPRESSION: Limited images during ERCP demonstrates deployment of a retrieval balloon and PTA of the extrahepatic biliary system at the ampulla. Please refer to the dictated operative report for full details of intraoperative findings and procedure. Electronically Signed   By: Corrie Mckusick D.O.   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  No debris found at balloon sweep.  4/13 EUS:  J shped gastric deformity.  Gastric erythema, bxd.  dusodenal deformity.  Mild dilation CBD w/o debris.  Dilated PD.  No patholology or masses in pancreas.  No lymphadenopathy.    *    Crohn's disease.  Multiple surgeries and resections with resulting short gut.  TPN since 2003.  Followed by center for gut rehap since 2012.    *   PICC line sepsis.  PCR panel positive for Stenotrophomonas.  IV Bactrim thru 5/4 per ID.    PICC line has not been removed.  ? If she has a location for different access.     PLAN   *   Creon 108 K units with meals.  Stopped colace.   GI fup is with dr Domenica Fail in [facility], she dose not follow w Dr Tarri Glenn who referred her to  [facility], Dr Brayton Layman.      Caitlin Peters  03/09/2020, 5:03 PM Phone [phone]

## 2020-03-09 NOTE — Progress Notes (Addendum)
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  Infectious disease as well as gastroenterology consulted.  Subjective / 24h Interval events: She is doing well, awaiting to go down and get her PICC line replaced.  No nausea or vomiting.  Assessment & Plan: Principal Problem Sepsis with stenotrophomonas bacteremia, present on admission-patient met sepsis criteria on admission with leukopenia, fever, tachycardia, tachypnea.  Blood cultures eventually speciated stenotrophomonas and infectious disease were consulted.  She was initially on broad-spectrum antibiotics and eventually transitioned to IV Bactrim upon blood culture speciation.  She has very poor peripheral access, and ideally 48 hours of PICC line window would have been indicated however due to very limited IV access her femoral PICC line has been exchanged by IR on 4/14.  Discussed with Dr. Linus Salmons over the phone, will need surveillance cultures.  He will follow.  Active Problems Short gut syndrome/history of Crohn's disease on chronic TPN/elevated liver enzyme tests -patient with a history of multiple resections due to Crohn's, now having short gut syndrome.  GI consulted, continue budesonide, Bentyl, Lomotil/Imodium for diarrhea and abdominal cramping.  Continue PPI.  Patient underwent an MRCP which showed biliary ductal dilatation, and eventually underwent an EGD and ERCP on 4/13, relatively unremarkable.  LFTs improving some today  Essential hypertension-continue Norvasc, metoprolol, blood pressure within acceptable parameters  Chronic pain syndrome-at home she takes both methadone and Dilaudid which is an   unusual regimen, recommend outpatient follow-up  Depression-continue Wellbutrin, trazodone  Pancytopenia-likely in the setting of sepsis, improving labs tomorrow  Acute kidney injury on chronic kidney disease stage IIIb-Baseline creatinine 1.4-1.6, increasing today to 2.0, avoid nephrotoxins, continue fluids, repeat renal function tomorrow   Scheduled Meds: . amLODipine  5 mg Oral Daily  . budesonide  9 mg Oral Daily  . buPROPion  200 mg Oral Daily  . chlorhexidine      . Chlorhexidine Gluconate Cloth  6 each Topical Daily  . cycloSPORINE  1 drop Both Eyes BID  . dicyclomine  20 mg Oral TID  . docusate sodium  100 mg Oral Daily  . DULoxetine  90 mg Oral Daily  . estradiol  2 mg Oral Daily  . famotidine  20 mg Oral BID  . fentaNYL      . fentaNYL      . lidocaine      . methadone  5 mg Oral 5 X Daily  . metoprolol tartrate  100 mg Oral BID  . midazolam      . pantoprazole  40 mg Oral Daily  . sodium chloride flush  10-40 mL Intracatheter Q12H  . sodium chloride flush  3 mL Intravenous Q12H  . sucralfate  1 g Oral TID WC & HS   Continuous Infusions: . dextrose 5% lactated ringers 100 mL/hr at 03/09/20 1256  . sulfamethoxazole-trimethoprim 320 mg (03/09/20 0616)   PRN Meds:.acetaminophen **OR** acetaminophen, diphenoxylate-atropine, HYDROmorphone (DILAUDID) injection, hydroxypropyl methylcellulose / hypromellose, loperamide, LORazepam, ondansetron **OR** ondansetron (ZOFRAN) IV, promethazine, sodium chloride, sodium chloride flush, traZODone  DVT prophylaxis: on hold post ERCP Code Status: Full code Family Communication: no family at bedside  Patient admitted from: home Anticipated d/c place: home Barriers to d/c: Worsening creatinine, pending ID  clearance  Consultants:  GI  Procedures:  ERCP 4/13 Impression:               - Prior biliary sphincterotomy appeared open.                           - The entire main bile duct was mildly dilated.                           -  Sphincteroplasty performed.                           - The biliary tree was swept and nothing was found. Recommendation:           - The patient will be observed post-procedure,                            until all discharge criteria are met.                           - Return patient to hospital ward for ongoing care.                           - Check liver enzymes (AST, ALT, alkaline                            phosphatase, bilirubin) in the morning.                           - Observe patient's clinical course.                           - Watch for pancreatitis, bleeding, perforation,                            and cholangitis.                           - Recommend no chemical VTE/DVT prophylaxis for 48                            hours in order to decrease risk of                            post-sphincterotomy bleeding. If anticoagulation is                            necessary, then would recommend heparin drip                            without bolus in 12-hours.                           - The findings and recommendations were discussed                            with  the patient.                           - The findings and recommendations were discussed                            with the referring physician.  EGD / EUS 4/13 Impression:               EGD Impression:                           - No gross lesions in esophagus.                           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                           - Erythematous mucosa in the stomach. Biopsied.                           - Duodenal angulation deformity.                           - Patent previous sphincterotomy was found at                            ampulla.                           EUS Impression:                           - There was mild dilation in the common bile duct.                            No stones/sludge noted.                            - The pancreatic duct had a dilated endosonographic                            appearance in the pancreatic head, genu of the                            pancreas, body of the pancreas and tail of the                            pancreas. Mild SB dilation noted in the body.                           - There was no sign of significant pathology in the  pancreatic head, genu of the pancreas, pancreatic                            body and pancreatic tail otherwise in parenchymal                            evaluation.                           - No malignant-appearing lymph nodes were                            visualized in the celiac region (level 20),                            peripancreatic region and porta hepatis region. Recommendation:           - Proceed to scheduled ERCP.                           - Observe patient's clinical course.                           - Consider repeat MRI/MRCP in 49-month to follow                            up her stable Pancreatic Duct dilation.                           - Await path results.  Microbiology  Blood cultures 4/10-Stenotrophomonas maltophilia  Antimicrobials: IV Bactrim   Objective: Vitals:   03/09/20 1140 03/09/20 1145 03/09/20 1150 03/09/20 1155  BP: (!) 143/76 134/83 135/77 130/66  Pulse: 63 70 63 65  Resp: _0 Temp:      TempSrc:      SpO2: 100% 100% 100% 100%  Weight:      Height:        Intake/Output Summary (Last 24 hours) at 03/09/2020 1334 Last data filed at 03/09/2020 0616 Gross per 24 hour  Intake 500 ml  Output --  Net 500 ml   Filed Weights   03/04/20 2347 03/05/20 1223 03/08/20 1010  Weight: 63.5 kg 66.9 kg 66.9 kg    Examination:  Constitutional: NAD Eyes: no scleral icterus ENMT: Mucous membranes are moist.  Neck: normal, supple Respiratory: clear to auscultation bilaterally, no wheezing, no crackles. Normal respiratory effort. No accessory muscle use.   Cardiovascular: Regular rate and rhythm, no murmurs / rubs / gallops. No LE edema. Good peripheral pulses Abdomen: non distended, no tenderness. Bowel sounds positive.  Musculoskeletal: no clubbing / cyanosis.  Skin: no rashes Neurologic: Nonfocal   Data Reviewed: I have independently reviewed following labs and imaging studies   CBC: Recent Labs  Lab 03/05/20 0048 03/06/20 0623 03/07/20 0508  WBC 1.5* 5.9 5.3  NEUTROABS 1.3* 4.9 3.8  HGB 9.9* 9.4* 8.7*  HCT 30.6* 29.6* 26.6*  MCV 95.9 94.3 92.7  PLT 123* 97* 1638   Basic Metabolic Panel: Recent Labs  Lab 03/05/20 0048 03/06/20 0623 03/07/20 0508 03/08/20 0612 03/09/20 0934  NA 140 138 136 136 134*  K 3.8 4.0 4.0  3.8 3.4*  CL 104 109 106 106 106  CO2 26 20* 21* 23 17*  GLUCOSE 79 68* 105* 93 70  BUN 39* 26* 16 11 21*  CREATININE 1.58* 1.41* 1.53* 1.64* 2.05*  CALCIUM 9.1 8.9 9.1 9.1 8.5*  MG  --  1.9 1.8  --   --   PHOS  --  2.6 3.0  --   --    Liver Function Tests: Recent Labs  Lab 03/05/20 0048 03/06/20 0623 03/07/20 0508 03/08/20 0612  AST 884* 556* 243* 122*  ALT 607* 912* 645* 460*  ALKPHOS 253* 285* 262* 270*  BILITOT 1.0 2.4* 1.4* 1.1  PROT 6.8 6.4* 6.3* 6.4*  ALBUMIN 3.1* 2.6* 2.7* 2.7*   Coagulation Profile: Recent Labs  Lab 03/05/20 0048  INR 1.0   HbA1C: No results for input(s): HGBA1C in the last 72 hours. CBG: No results for input(s): GLUCAP in the last 168 hours.  Recent Results (from the past 240 hour(s))  Respiratory Panel by RT PCR (Flu A&B, Covid) - Nasopharyngeal Swab     Status: None   Collection Time: 03/05/20 12:48 AM   Specimen: Nasopharyngeal Swab  Result Value Ref Range Status   SARS Coronavirus 2 by RT PCR NEGATIVE NEGATIVE Final    Comment: (NOTE) SARS-CoV-2 target nucleic acids are NOT DETECTED. The SARS-CoV-2 RNA is generally detectable in upper respiratoy specimens during the acute phase of infection. The lowest concentration of SARS-CoV-2 viral copies this  assay can detect is 131 copies/mL. A negative result does not preclude SARS-Cov-2 infection and should not be used as the sole basis for treatment or other patient management decisions. A negative result may occur with  improper specimen collection/handling, submission of specimen other than nasopharyngeal swab, presence of viral mutation(s) within the areas targeted by this assay, and inadequate number of viral copies (<131 copies/mL). A negative result must be combined with clinical observations, patient history, and epidemiological information. The expected result is Negative. Fact Sheet for Patients:  PinkCheek.be Fact Sheet for Healthcare Providers:  GravelBags.it This test is not yet ap proved or cleared by the [place] FDA and  has been authorized for detection and/or diagnosis of SARS-CoV-2 by FDA under an Emergency Use Authorization (EUA). This EUA will remain  in effect (meaning this test can be used) for the duration of the COVID-19 declaration under Section 564(b)(1) of the Act, 21 U.S.C. section 360bbb-3(b)(1), unless the authorization is terminated or revoked sooner.    Influenza A by PCR NEGATIVE NEGATIVE Final   Influenza B by PCR NEGATIVE NEGATIVE Final    Comment: (NOTE) The Xpert Xpress SARS-CoV-2/FLU/RSV assay is intended as an aid in  the diagnosis of influenza from Nasopharyngeal swab specimens and  should not be used as a sole basis for treatment. Nasal washings and  aspirates are unacceptable for Xpert Xpress SARS-CoV-2/FLU/RSV  testing. Fact Sheet for Patients: PinkCheek.be Fact Sheet for Healthcare Providers: GravelBags.it This test is not yet approved or cleared by the [place] FDA and  has been authorized for detection and/or diagnosis of SARS-CoV-2 by  FDA under an Emergency Use Authorization (EUA). This EUA will remain  in  effect (meaning this test can be used) for the duration of the  Covid-19 declaration under Section 564(b)(1) of the Act, 21  U.S.C. section 360bbb-3(b)(1), unless the authorization is  terminated or revoked. Performed at [facility], [place] [address], [place], [place] [address]   Blood Culture (routine x 2)     Status:  None (Preliminary result)   Collection Time: 03/05/20 12:51 AM   Specimen: BLOOD  Result Value Ref Range Status   Specimen Description BLOOD RIGHT WRIST  Final   Special Requests   Final    BOTTLES DRAWN AEROBIC AND ANAEROBIC Blood Culture results may not be optimal due to an inadequate volume of blood received in culture bottles Performed at [facility] [address], [place], [place] [address]    Culture NO GROWTH 4 DAYS  Final   Report Status PENDING  Incomplete  Blood Culture (routine x 2)     Status: Abnormal   Collection Time: 03/05/20  1:06 AM   Specimen: BLOOD  Result Value Ref Range Status   Specimen Description BLOOD LEFT FEMORAL ARTERY  Final   Special Requests   Final    BOTTLES DRAWN AEROBIC AND ANAEROBIC Blood Culture results may not be optimal due to an excessive volume of blood received in culture bottles   Culture  Setup Time   Final    GRAM NEGATIVE RODS IN BOTH AEROBIC AND ANAEROBIC BOTTLES CRITICAL RESULT CALLED TO, READ BACK BY AND VERIFIED WITH: PHARMD K HURTH [id number] AT 1726 BY CM Performed at [facility], [place] [address], [place], [place] [address]    Culture STENOTROPHOMONAS MALTOPHILIA (A)  Final   Report Status 03/07/2020 FINAL  Final   Organism ID, Bacteria STENOTROPHOMONAS MALTOPHILIA  Final      Susceptibility   Stenotrophomonas maltophilia - MIC*    LEVOFLOXACIN 0.5 SENSITIVE Sensitive     TRIMETH/SULFA <=20 SENSITIVE Sensitive     * STENOTROPHOMONAS MALTOPHILIA  Blood Culture ID Panel (Reflexed)     Status: None   Collection Time: 03/05/20  1:06 AM  Result Value Ref Range Status   Enterococcus species NOT  DETECTED NOT DETECTED Final   Listeria monocytogenes NOT DETECTED NOT DETECTED Final   Staphylococcus species NOT DETECTED NOT DETECTED Final   Staphylococcus aureus (BCID) NOT DETECTED NOT DETECTED Final   Streptococcus species NOT DETECTED NOT DETECTED Final   Streptococcus agalactiae NOT DETECTED NOT DETECTED Final   Streptococcus pneumoniae NOT DETECTED NOT DETECTED Final   Streptococcus pyogenes NOT DETECTED NOT DETECTED Final   Acinetobacter baumannii NOT DETECTED NOT DETECTED Final   Enterobacteriaceae species NOT DETECTED NOT DETECTED Final   Enterobacter cloacae complex NOT DETECTED NOT DETECTED Final   Escherichia coli NOT DETECTED NOT DETECTED Final   Klebsiella oxytoca NOT DETECTED NOT DETECTED Final   Klebsiella pneumoniae NOT DETECTED NOT DETECTED Final   Proteus species NOT DETECTED NOT DETECTED Final   Serratia marcescens NOT DETECTED NOT DETECTED Final   Haemophilus influenzae NOT DETECTED NOT DETECTED Final   Neisseria meningitidis NOT DETECTED NOT DETECTED Final   Pseudomonas aeruginosa NOT DETECTED NOT DETECTED Final   Candida albicans NOT DETECTED NOT DETECTED Final   Candida glabrata NOT DETECTED NOT DETECTED Final   Candida krusei NOT DETECTED NOT DETECTED Final   Candida parapsilosis NOT DETECTED NOT DETECTED Final   Candida tropicalis NOT DETECTED NOT DETECTED Final    Comment: Performed at [facility], [place] [address] [place], [place] [address]  Urine culture     Status: Abnormal   Collection Time: 03/05/20  8:05 AM   Specimen: In/Out Cath Urine  Result Value Ref Range Status   Specimen Description IN/OUT CATH URINE  Final   Special Requests   Final    NONE Performed at [facility], [place] [address] [place], [place]  [address]    Culture 30,000 COLONIES/mL STAPHYLOCOCCUS EPIDERMIDIS (A)  Final   Report Status 03/07/2020 FINAL  Final   Organism ID, Bacteria STAPHYLOCOCCUS EPIDERMIDIS (A)  Final      Susceptibility   Staphylococcus epidermidis  - MIC*    CIPROFLOXACIN 4 RESISTANT Resistant     GENTAMICIN <=0.5 SENSITIVE Sensitive     NITROFURANTOIN <=16 SENSITIVE Sensitive     OXACILLIN >=4 RESISTANT Resistant     TETRACYCLINE >=16 RESISTANT Resistant     TRIMETH/SULFA <=10 SENSITIVE Sensitive     CLINDAMYCIN >=8 RESISTANT Resistant     RIFAMPIN 1 SENSITIVE Sensitive     Inducible Clindamycin NEGATIVE Sensitive     * 30,000 COLONIES/mL STAPHYLOCOCCUS EPIDERMIDIS     Radiology Studies: No results found.   Marzetta Board, MD, PhD [facility]  Between 7 am - 7 pm I am available, please contact me via Amion or Securechat  Between 7 pm - 7 am I am not available, please contact night coverage MD/APP via Amion

## 2020-03-09 NOTE — Procedures (Signed)
  Procedure: Exchange/revise tunneled L fem DL CVC   EBL:   minimal Complications:  none immediate  See full dictation in [facility].  Dillard Cannon MD Main # [phone] Pager  [phone]

## 2020-03-09 NOTE — Progress Notes (Signed)
Pharmacy Antibiotic Note  Caitlin Peters is a 59 y.o. female admitted on 03/04/2020 with stenotrophomonas bacteremia.  Pharmacy has been consulted for IV Bactrim dosing.  Blood cultures growing stenotrophomonas in one of two sets. Currently on IV Bactrim. Patient has CKD, renal function improved since admit, ClCr is borderline at 30 ml/min with increasing SCr. K has trended down.   Plan: Continue IV Bactrim 47m/kg Q8 hr. If renal fx worsens further, will consider dosage adjustment  Monitor cultures, clinical status, renal fx, potassium level  F/u end date   Height: 5' 8"  (172.7 cm) Weight: 66.9 kg (147 lb 7.8 oz) IBW/kg (Calculated) : 63.9  Temp (24hrs), Avg:98.2 F (36.8 C), Min:98.1 F (36.7 C), Max:98.4 F (36.9 C)  Recent Labs  Lab 03/05/20 0048 03/06/20 0623 03/07/20 0508 03/08/20 0612 03/09/20 0934  WBC 1.5* 5.9 5.3  --   --   CREATININE 1.58* 1.41* 1.53* 1.64* 2.05*  LATICACIDVEN 1.4  --   --   --   --     Estimated Creatinine Clearance: 30.2 mL/min (A) (by C-G formula based on SCr of 2.05 mg/dL (H)).    Antimicrobials this admission: TMP/SMX 4/11>  Aztreonam 4/10 >>4/11 Vanc  4/10 >>4/11  MTZ 4/10 >>4/11  Microbiology results: 4/10 Bcx (1/2) >> stenotrophomonas  4/10 Ucx >> 30k Staph epidermidis  Thank you for allowing pharmacy to be a part of this patient's care.  BAlbertina Parr PharmD., BCPS, BCCCP Clinical Pharmacist Clinical phone for 03/09/20 until 3:30pm: [phone] after 3:30pm, please refer to [facility]for [facility]-specific pharmacist

## 2020-03-09 NOTE — Progress Notes (Signed)
[facility]   Reason for visit: Follow up on bacteremia  Interval History: no new issues.  Afebrile.  Line exchanged.     Physical Exam: Constitutional:  Vitals:   03/09/20 1150 03/09/20 1155  BP: 135/77 130/66  Pulse: 63 65  Resp: 18 16  Temp:    SpO2: 100% 100%   patient appears in NAD Eyes: anicteric Respiratory: Normal respiratory effort; CTA B Cardiovascular: RRR  Review of Systems: Constitutional: negative for fevers and chills Gastrointestinal: negative for diarrhea  Lab Results  Component Value Date   WBC 5.3 03/07/2020   HGB 8.7 (L) 03/07/2020   HCT 26.6 (L) 03/07/2020   MCV 92.7 03/07/2020   PLT 101 (L) 03/07/2020    Lab Results  Component Value Date   CREATININE 2.05 (H) 03/09/2020   BUN 21 (H) 03/09/2020   NA 134 (L) 03/09/2020   K 3.4 (L) 03/09/2020   CL 106 03/09/2020   CO2 17 (L) 03/09/2020    Lab Results  Component Value Date   ALT 460 (H) 03/08/2020   AST 122 (H) 03/08/2020   ALKPHOS 270 (H) 03/08/2020     Microbiology: Recent Results (from the past 240 hour(s))  Respiratory Panel by RT PCR (Flu A&B, Covid) - Nasopharyngeal Swab     Status: None   Collection Time: 03/05/20 12:48 AM   Specimen: Nasopharyngeal Swab  Result Value Ref Range Status   SARS Coronavirus 2 by RT PCR NEGATIVE NEGATIVE Final    Comment: (NOTE) SARS-CoV-2 target nucleic acids are NOT DETECTED. The SARS-CoV-2 RNA is generally detectable in upper respiratoy specimens during the acute phase of infection. The lowest concentration of SARS-CoV-2 viral copies this assay can detect is 131 copies/mL. A negative result does not preclude SARS-Cov-2 infection and should not be used as the sole basis for treatment or other patient management decisions. A negative result may occur with  improper specimen collection/handling, submission of specimen other than nasopharyngeal swab, presence of viral mutation(s) within the areas targeted by this  assay, and inadequate number of viral copies (<131 copies/mL). A negative result must be combined with clinical observations, patient history, and epidemiological information. The expected result is Negative. Fact Sheet for Patients:  PinkCheek.be Fact Sheet for Healthcare Providers:  GravelBags.it This test is not yet ap proved or cleared by the [place] FDA and  has been authorized for detection and/or diagnosis of SARS-CoV-2 by FDA under an Emergency Use Authorization (EUA). This EUA will remain  in effect (meaning this test can be used) for the duration of the COVID-19 declaration under Section 564(b)(1) of the Act, 21 U.S.C. section 360bbb-3(b)(1), unless the authorization is terminated or revoked sooner.    Influenza A by PCR NEGATIVE NEGATIVE Final   Influenza B by PCR NEGATIVE NEGATIVE Final    Comment: (NOTE) The Xpert Xpress SARS-CoV-2/FLU/RSV assay is intended as an aid in  the diagnosis of influenza from Nasopharyngeal swab specimens and  should not be used as a sole basis for treatment. Nasal washings and  aspirates are unacceptable for Xpert Xpress SARS-CoV-2/FLU/RSV  testing. Fact Sheet for Patients: PinkCheek.be Fact Sheet for Healthcare Providers: GravelBags.it This test is not yet approved or cleared by the [place] FDA and  has been authorized for detection and/or diagnosis of SARS-CoV-2 by  FDA under an Emergency Use Authorization (EUA). This EUA will remain  in effect (meaning this test can be used) for the duration of the  Covid-19 declaration under Section 564(b)(1)  of the Act, 21  U.S.C. section 360bbb-3(b)(1), unless the authorization is  terminated or revoked. Performed at [facility], [place] [address], [place], [place] [address]   Blood Culture (routine x 2)     Status: None (Preliminary result)   Collection Time: 03/05/20  12:51 AM   Specimen: BLOOD  Result Value Ref Range Status   Specimen Description BLOOD RIGHT WRIST  Final   Special Requests   Final    BOTTLES DRAWN AEROBIC AND ANAEROBIC Blood Culture results may not be optimal due to an inadequate volume of blood received in culture bottles Performed at [facility] [address], [place], [place] [address]    Culture NO GROWTH 4 DAYS  Final   Report Status PENDING  Incomplete  Blood Culture (routine x 2)     Status: Abnormal   Collection Time: 03/05/20  1:06 AM   Specimen: BLOOD  Result Value Ref Range Status   Specimen Description BLOOD LEFT FEMORAL ARTERY  Final   Special Requests   Final    BOTTLES DRAWN AEROBIC AND ANAEROBIC Blood Culture results may not be optimal due to an excessive volume of blood received in culture bottles   Culture  Setup Time   Final    GRAM NEGATIVE RODS IN BOTH AEROBIC AND ANAEROBIC BOTTLES CRITICAL RESULT CALLED TO, READ BACK BY AND VERIFIED WITH: PHARMD K HURTH [id number] AT 1726 BY CM Performed at [facility], [place] [address], [place], [place] [address]    Culture STENOTROPHOMONAS MALTOPHILIA (A)  Final   Report Status 03/07/2020 FINAL  Final   Organism ID, Bacteria STENOTROPHOMONAS MALTOPHILIA  Final      Susceptibility   Stenotrophomonas maltophilia - MIC*    LEVOFLOXACIN 0.5 SENSITIVE Sensitive     TRIMETH/SULFA <=20 SENSITIVE Sensitive     * STENOTROPHOMONAS MALTOPHILIA  Blood Culture ID Panel (Reflexed)     Status: None   Collection Time: 03/05/20  1:06 AM  Result Value Ref Range Status   Enterococcus species NOT DETECTED NOT DETECTED Final   Listeria monocytogenes NOT DETECTED NOT DETECTED Final   Staphylococcus species NOT DETECTED NOT DETECTED Final   Staphylococcus aureus (BCID) NOT DETECTED NOT DETECTED Final   Streptococcus species NOT DETECTED NOT DETECTED Final   Streptococcus agalactiae NOT DETECTED NOT DETECTED Final   Streptococcus pneumoniae NOT DETECTED NOT DETECTED Final    Streptococcus pyogenes NOT DETECTED NOT DETECTED Final   Acinetobacter baumannii NOT DETECTED NOT DETECTED Final   Enterobacteriaceae species NOT DETECTED NOT DETECTED Final   Enterobacter cloacae complex NOT DETECTED NOT DETECTED Final   Escherichia coli NOT DETECTED NOT DETECTED Final   Klebsiella oxytoca NOT DETECTED NOT DETECTED Final   Klebsiella pneumoniae NOT DETECTED NOT DETECTED Final   Proteus species NOT DETECTED NOT DETECTED Final   Serratia marcescens NOT DETECTED NOT DETECTED Final   Haemophilus influenzae NOT DETECTED NOT DETECTED Final   Neisseria meningitidis NOT DETECTED NOT DETECTED Final   Pseudomonas aeruginosa NOT DETECTED NOT DETECTED Final   Candida albicans NOT DETECTED NOT DETECTED Final   Candida glabrata NOT DETECTED NOT DETECTED Final   Candida krusei NOT DETECTED NOT DETECTED Final   Candida parapsilosis NOT DETECTED NOT DETECTED Final   Candida tropicalis NOT DETECTED NOT DETECTED Final    Comment: Performed at [facility], [place] [address] [place],  [address]  Urine culture     Status: Abnormal   Collection Time: 03/05/20  8:05 AM  Specimen: In/Out Cath Urine  Result Value Ref Range Status   Specimen Description IN/OUT CATH URINE  Final   Special Requests   Final    NONE Performed at [facility], [address] [place], [place] [address]    Culture 30,000 COLONIES/mL STAPHYLOCOCCUS EPIDERMIDIS (A)  Final   Report Status 03/07/2020 FINAL  Final   Organism ID, Bacteria STAPHYLOCOCCUS EPIDERMIDIS (A)  Final      Susceptibility   Staphylococcus epidermidis - MIC*    CIPROFLOXACIN 4 RESISTANT Resistant     GENTAMICIN <=0.5 SENSITIVE Sensitive     NITROFURANTOIN <=16 SENSITIVE Sensitive     OXACILLIN >=4 RESISTANT Resistant     TETRACYCLINE >=16 RESISTANT Resistant     TRIMETH/SULFA <=10 SENSITIVE Sensitive     CLINDAMYCIN >=8 RESISTANT Resistant     RIFAMPIN 1 SENSITIVE Sensitive     Inducible Clindamycin NEGATIVE Sensitive      * 30,000 COLONIES/mL STAPHYLOCOCCUS EPIDERMIDIS    Impression/Plan:  1. Bacteremia - Stenotrophomonas positive. On Bactrim and will continue for 3 weeks through May 4th.  I prefer IV due to line infection.   I will arrange follow up with Dr. Tommy Medal after treatment completion  2.  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  It is unclear if she has other locations for access if her current access is permanently removed therefore a catheter exchange was done again.  She appropriately does not want to be without a line and hard to put in a new one with ongoing bacteremia.  If bacteremia reoccurs, it might be prudent to treat through for 3-5 days then attempt to find a different access prior to removal of the line.

## 2020-03-09 NOTE — Progress Notes (Signed)
PHARMACY CONSULT NOTE FOR:  OUTPATIENT  PARENTERAL ANTIBIOTIC THERAPY (OPAT)  Indication: Stenotrophomonas bacteremia Regimen: IV sulfamethoxazole-trimethoprim (320 mg trimethoprim q8 hours) End date: 03/29/20  IV antibiotic discharge orders are pended. To discharging provider:  please sign these orders via discharge navigator,  Select New Orders & click on the button choice - Manage This Unsigned Work.     Thank you for allowing pharmacy to be a part of this patient's care.  Sherren Kerns, PharmD PGY1 Acute Care Pharmacy Resident 03/09/2020, 3:28 PM

## 2020-03-10 ENCOUNTER — Encounter: Payer: Self-pay | Admitting: Gastroenterology

## 2020-03-10 DIAGNOSIS — G894 Chronic pain syndrome: Secondary | ICD-10-CM | POA: Diagnosis not present

## 2020-03-10 DIAGNOSIS — R945 Abnormal results of liver function studies: Secondary | ICD-10-CM | POA: Diagnosis not present

## 2020-03-10 DIAGNOSIS — A419 Sepsis, unspecified organism: Secondary | ICD-10-CM | POA: Diagnosis not present

## 2020-03-10 DIAGNOSIS — R7881 Bacteremia: Secondary | ICD-10-CM | POA: Diagnosis not present

## 2020-03-10 DIAGNOSIS — A498 Other bacterial infections of unspecified site: Secondary | ICD-10-CM | POA: Diagnosis present

## 2020-03-10 LAB — COMPREHENSIVE METABOLIC PANEL
ALT: 233 U/L — ABNORMAL HIGH (ref 0–44)
AST: 41 U/L (ref 15–41)
Albumin: 2.9 g/dL — ABNORMAL LOW (ref 3.5–5.0)
Alkaline Phosphatase: 247 U/L — ABNORMAL HIGH (ref 38–126)
Anion gap: 7 (ref 5–15)
BUN: 18 mg/dL (ref 6–20)
CO2: 22 mmol/L (ref 22–32)
Calcium: 9 mg/dL (ref 8.9–10.3)
Chloride: 110 mmol/L (ref 98–111)
Creatinine, Ser: 1.88 mg/dL — ABNORMAL HIGH (ref 0.44–1.00)
GFR calc Af Amer: 34 mL/min — ABNORMAL LOW (ref >60)
GFR calc non Af Amer: 29 mL/min — ABNORMAL LOW (ref >60)
Glucose, Bld: 85 mg/dL (ref 70–99)
Potassium: 3.5 mmol/L (ref 3.5–5.1)
Sodium: 139 mmol/L (ref 135–145)
Total Bilirubin: 0.9 mg/dL (ref 0.3–1.2)
Total Protein: 6.4 g/dL — ABNORMAL LOW (ref 6.5–8.1)

## 2020-03-10 LAB — CBC
HCT: 24.3 % — ABNORMAL LOW (ref 36.0–46.0)
Hemoglobin: 8 g/dL — ABNORMAL LOW (ref 12.0–15.0)
MCH: 30.5 pg (ref 26.0–34.0)
MCHC: 32.9 g/dL (ref 30.0–36.0)
MCV: 92.7 fL (ref 80.0–100.0)
Platelets: 122 10*3/uL — ABNORMAL LOW (ref 150–400)
RBC: 2.62 MIL/uL — ABNORMAL LOW (ref 3.87–5.11)
RDW: 14.6 % (ref 11.5–15.5)
WBC: 2.7 10*3/uL — ABNORMAL LOW (ref 4.0–10.5)
nRBC: 0 % (ref 0.0–0.2)

## 2020-03-10 LAB — CULTURE, BLOOD (ROUTINE X 2): Culture: NO GROWTH

## 2020-03-10 LAB — PHOSPHORUS: Phosphorus: 2.8 mg/dL (ref 2.5–4.6)

## 2020-03-10 LAB — MAGNESIUM: Magnesium: 1.6 mg/dL — ABNORMAL LOW (ref 1.7–2.4)

## 2020-03-10 MED ORDER — PANTOPRAZOLE SODIUM 20 MG PO TBEC
20.0000 mg | DELAYED_RELEASE_TABLET | Freq: Every day | ORAL | Status: DC
  Administered 2020-03-10 – 2020-03-11 (×2): 20 mg via ORAL
  Filled 2020-03-10 (×3): qty 1

## 2020-03-10 MED ORDER — SULFAMETHOXAZOLE-TRIMETHOPRIM 400-80 MG/5ML IV SOLN: 320.0000 mg | Freq: Three times a day (TID) | INTRAVENOUS | 0 refills | Status: AC

## 2020-03-10 NOTE — Discharge Summary (Signed)
Physician Discharge Summary  [name]:[id number] DOB: [birth year] DOA: 03/04/2020  PCP: [name], [name], MD  Admit date: 03/04/2020 Discharge date: 03/10/2020  Admitted From: home Disposition:  home  Recommendations for Outpatient Follow-up:  1. Follow up with PCP in 1-2 weeks 2. Follow up with Dr Tommy Medal with ID  Home Health: RN Equipment/Devices: femoral tunneled PICC line  Discharge Condition: stable CODE STATUS: Full code Diet recommendation: regular, on TPN  HPI: Per admitting MD, Caitlin Peters is a 59 y.o. female with medical history significant of short gut syndrome with malnutrition, on TPN, associated with Crohn's disease and multiple resections; iron deficiency anemia requiring recurrent transfusions; recurrent bacteremia; HTN; stage 3 CKD; and chronic pain syndrome presenting with sepsis.  She was last hospitalized from 2/6-15 with recurrent Enterobacter bacteremia thought to be associated with a line infection (vs. Less likely GI source).  She had exchange on tunneled left CVC by IR on 2/11 and was consulted on by IR, GI, and ID as well as nephrology for acute on chronic kidney disease. She reports that she was doing very well yesterday.  She started getting fevers and pain.  She took meds like she always does but her daughter called the ambulance because she wasn't doing well.  It is usually either the gut or the line that causes the problem.  She started to have painful diarrhea, was scheduled to see GI on 4/13.  She reports that her ducts are inflamed; they went in and looked at them and he cut a piece to try to straighten it.  She was ok until this week.  She has burning in and out.  She is having 5-6 stools a day, sometimes more if she eats.  She told her pain doctor what was going on and he said to take her pain meds like she has been and to get with GI.  When she is at home, she doesn't always take pain pills but then they don't work during flares.   "And I've got to come in and they put me on my Dilaudid IV and Phenergan.  I don't do anything by mouth because my gut isn't good at this time."  She had some vomiting last week but none recently.  She does have nausea.  No fevers "until now."  She is also asking for ice chips and sips.   Hospital Course / Discharge diagnoses: Principal Problem Sepsis with stenotrophomonas bacteremia, present on admission-patient met sepsis criteria on admission with leukopenia, fever, tachycardia, tachypnea.  Blood cultures eventually speciated stenotrophomonas and infectious disease were consulted.  She was initially on broad-spectrum antibiotics and eventually transitioned to IV Bactrim upon blood culture speciation.  She has very poor peripheral access, and ideally 48 hours of PICC line window would have been indicated however due to very limited IV access her femoral PICC line has been exchanged by IR on 4/14.  Infectious disease recommends 3 weeks of IV Bactrim with end date 03/29/2020.  She will have repeat blood cultures as an outpatient close to end date.  She will follow up with ID, an appointment will be arranged with Dr. Drucilla Schmidt  Active Problems Short gut syndrome/history of Crohn's disease on chronic TPN/elevated liver enzyme tests -patient with a history of multiple resections due to Crohn's, now having short gut syndrome.  GI consulted, continue budesonide, Bentyl, Lomotil/Imodium for diarrhea and abdominal cramping. Patient underwent an MRCP which showed biliary ductal dilatation, and eventually underwent an EGD and ERCP  on 4/13, relatively unremarkable.  LFTs improving Essential hypertension-continue Norvasc, metoprolol, blood pressure within acceptable parameters  Chronic pain syndrome-at home she takes both methadone and Dilaudid which is an unusual regimen, recommend outpatient follow-up Depression-continue Wellbutrin, trazodone Pancytopenia-likely in the setting of sepsis, stable Acute kidney injury  on chronic kidney disease stage IIIb-Baseline creatinine 1.4-1.6, increased at one point up to 2.0 but back down and improving upon discharge  Discharge Instructions  Discharge Instructions    Home infusion instructions   Complete by: As directed    Instructions: Flushing of vascular access device: 0.9% NaCl pre/post medication administration and prn patency; Heparin 100 u/ml, 67m for implanted ports and Heparin 10u/ml, 594mfor all other central venous catheters.     Allergies as of 03/10/2020      Reactions   Cefepime Other (See Comments)   Neurotoxicity occurring in setting of AKI. Ceftriaxone tolerated during same admit   Gabapentin Other (See Comments)   unknown   Hyoscyamine Hives, Swelling   Legs swelling Disorientation   Lyrica [pregabalin] Other (See Comments)   unknown   Meperidine Hives   Other reaction(s): GI Upset Due to Ch[place]   Topamax [topiramate] Other (See Comments)   unknown   Fentanyl Rash   Pt is allergic to fentanyl patch related to the glue (gives her a rash) Pt states she is NOT allergic to fentanyl IV medicine   Morphine And Related Rash      Medication List    TAKE these medications   acetaminophen 325 MG tablet Commonly known as: TYLENOL Take 650 mg by mouth every 6 (six) hours as needed for mild pain.   amLODipine 5 MG tablet Commonly known as: NORVASC Take 1 tablet (5 mg total) by mouth daily.   budesonide 3 MG 24 hr capsule Commonly known as: ENTOCORT EC Take 3 capsules (9 mg total) by mouth daily.   buPROPion 100 MG 12 hr tablet Commonly known as: WELLBUTRIN SR Take 2 tablets (200 mg total) by mouth daily.   calcium carbonate 1250 (500 Ca) MG chewable tablet Commonly known as: OS-CAL Chew 1 tablet by mouth 2 (two) times daily.   cholecalciferol 25 MCG (1000 UNIT) tablet Commonly known as: VITAMIN D3 Take 2,000 Units by mouth daily.   cycloSPORINE 0.05 % ophthalmic emulsion Commonly known as: RESTASIS Place 1 drop into both  eyes 2 (two) times daily.   Dexilant 60 MG capsule Generic drug: dexlansoprazole Take 1 capsule (60 mg total) by mouth daily.   Dextran 70-Hypromellose 0.1-0.3 % Soln Place 1 drop into both eyes 4 (four) times daily.   dicyclomine 20 MG tablet Commonly known as: BENTYL Take 20 mg by mouth 3 (three) times daily.   diphenoxylate-atropine 2.5-0.025 MG tablet Commonly known as: LOMOTIL Take 1 tablet by mouth 4 (four) times daily as needed for diarrhea or loose stools.   DULoxetine 30 MG capsule Commonly known as: CYMBALTA Take 3 capsules (90 mg total) by mouth daily.   estradiol 2 MG tablet Commonly known as: ESTRACE Take 1 tablet (2 mg total) by mouth daily.   famotidine 20 MG tablet Commonly known as: PEPCID Take 20 mg by mouth at bedtime.   HYDROmorphone 4 MG tablet Commonly known as: Dilaudid Take 1 tablet (4 mg total) by mouth every 6 (six) hours as needed for severe pain.   lipase/protease/amylase 36000 UNITS Cpep capsule Commonly known as: CREON Take 3 capsules (108,000 Units total) by mouth 3 (three) times daily before meals.   loperamide 2 MG  capsule Commonly known as: IMODIUM Take 1 capsule (2 mg total) by mouth as needed for diarrhea or loose stools.   methadone 10 MG tablet Commonly known as: DOLOPHINE Take 1 tablet (10 mg total) by mouth every 8 (eight) hours. What changed:   how much to take  when to take this  additional instructions   metoprolol tartrate 100 MG tablet Commonly known as: LOPRESSOR Take 1 tablet (100 mg total) by mouth 2 (two) times daily.   MULTIVITAMIN ADULT PO Take 1 tablet by mouth daily.   Omega-3 1000 MG Caps Take 1 capsule by mouth daily.   potassium chloride SA 20 MEQ tablet Commonly known as: KLOR-CON Take 40 mEq by mouth daily.   PRESCRIPTION MEDICATION Home TPN [facility] [place]   PROBIOTIC-10 PO Take 1 capsule by mouth daily.   promethazine 25 MG tablet Commonly known as: PHENERGAN TAKE 1  TABLET BY MOUTH EVERY 6 HOURS AS NEEDED FOR NAUSEA AND VOMITING   sucralfate 1 GM/10ML suspension Commonly known as: Carafate Take 10 mLs (1 g total) by mouth 4 (four) times daily -  with meals and at bedtime.   sulfamethoxazole-trimethoprim 400-80 MG/5ML injection Commonly known as: BACTRIM Inject 20 mLs (320 mg of trimethoprim total) into the vein every 8 (eight) hours for 20 days. indication:  Stenotrophomonas Bacteremia Last Day of Therapy:  03/29/20 Labs - Sunday/Monday:  CBC/D, BMP Labs - Thursday:  BMP Labs - Every other week:  ESR and CRP   traZODone 100 MG tablet Commonly known as: DESYREL Take 2 tablets (200 mg total) by mouth at bedtime as needed for sleep.   vitamin B-12 100 MCG tablet Commonly known as: CYANOCOBALAMIN Take 1,000 mcg by mouth daily.            Home Infusion Instuctions  (From admission, onward)         Start     Ordered   03/10/20 0000  Home infusion instructions    Question:  Instructions  Answer:  Flushing of vascular access device: 0.9% NaCl pre/post medication administration and prn patency; Heparin 100 u/ml, 59m for implanted ports and Heparin 10u/ml, 52mfor all other central venous catheters.   03/10/20 0846         Follow-up Information    VaTommy MedalCoLavell IslamMD Follow up.   Specialty: Infectious Diseases Why: ID will set up your follow up apointment Contact information: [phone]           Consultations:  GI  ID  Procedures/Studies: ERCP 4/13 Impression: - Prior biliary sphincterotomy appeared open. - The entire main bile duct was mildly dilated. - Sphincteroplasty performed. - The biliary tree was swept and nothing was found. Recommendation: - The patient will be observed post-procedure,  until all discharge criteria are  met. - Return patient to hospital ward for ongoing care. - Check liver enzymes (AST, ALT, alkaline  phosphatase, bilirubin) in the morning. - Observe patient's clinical course. - Watch for pancreatitis, bleeding, perforation,  and cholangitis. - Recommend no chemical VTE/DVT prophylaxis for 48  hours in order to decrease risk of  post-sphincterotomy bleeding. If anticoagulation is  necessary, then would recommend heparin drip  without bolus in 12-hours. - The findings and recommendations were discussed  with the patient. - The findings and recommendations were discussed  with the referring physician.  EGD / EUS 4/13 Impression: EGD Impression: - No gross lesions in esophagus. - Acquired J-shaped deformity in the  gastric body  and in the gastric antrum. - Erythematous mucosa in the stomach. Biopsied. - Duodenal angulation deformity. - Patent previous sphincterotomy was found at  ampulla. EUS Impression: - There was mild dilation in the common bile duct.  No stones/sludge noted. - The pancreatic duct had a dilated endosonographic  appearance in the pancreatic head, genu of the  pancreas, body of the pancreas and tail of the   pancreas. Mild SB dilation noted in the body. - There was no sign of significant pathology in the  pancreatic head, genu of the pancreas, pancreatic  body and pancreatic tail otherwise in parenchymal  evaluation. - No malignant-appearing lymph nodes were  visualized in the celiac region (level 20),  peripancreatic region and porta hepatis region. Recommendation: - Proceed to scheduled ERCP. - Observe patient's clinical course. - Consider repeat MRI/MRCP in 70-month to follow  up her stable Pancreatic Duct dilation. - Await path results.  CT ABDOMEN PELVIS W CONTRAST  Result Date: 03/05/2020 CLINICAL DATA:  Crohn's disease.  Sepsis EXAM: CT ABDOMEN AND PELVIS WITH CONTRAST TECHNIQUE: Multidetector CT imaging of the abdomen and pelvis was performed using the standard protocol following bolus administration of intravenous contrast. CONTRAST:  860mOMNIPAQUE IOHEXOL 300 MG/ML  SOLN COMPARISON:  CT 01/02/2020 FINDINGS: Lower chest: Lung bases are clear. Hepatobiliary: No focal hepatic lesion. Postcholecystectomy. No biliary dilatation. Pancreas: There is moderate dilatation of the pancreatic duct to 5 mm unchanged from prior. Moderate dilatation of the common bile duct to 7 mm. These findings are similar to most recent CT exam of 01/02/2020 and improved from CT of 11/25/2019. Sphincterotomy 11/26/2019 Spleen: Normal spleen Adrenals/urinary tract: Adrenal glands and kidneys are normal. The ureters and bladder normal. Stomach/Bowel: Stomach is normal. No bowel obstruction. There multiple surgical anastomosis is in the small bowel. No evidence of bowel obstruction or acute inflammation  identified. No abscess. Apparent partial RIGHT hemicolectomy. No inflammation of the colon identified. No stricturing or obstruction. Vascular/Lymphatic: Abdominal aorta is normal caliber with atherosclerotic calcification. There is no retroperitoneal or periportal lymphadenopathy. No pelvic lymphadenopathy. LEFT femoral central venous line noted. Reproductive: Post hysterectomy. Other: No free fluid.  Abdominal abscess. Musculoskeletal: No aggressive osseous lesion. IMPRESSION: 1. No acute abdominopelvic process identified. 2. Multiple small-bowel and colon anastomoses without evidence of acute inflammation, stricturing, or bowel obstruction. 3. Dilatation of the common bile duct and pancreatic duct persistent but much improved from CT 11/25/2019 following sphincterotomy. Electronically Signed   By: StSuzy Bouchard.D.   On: 03/05/2020 06:36   MR ABDOMEN MRCP WO CONTRAST  Result Date: 03/07/2020 CLINICAL DATA:  89ear old female with history of jaundice. Abnormal liver enzymes. EXAM: MRI ABDOMEN WITHOUT CONTRAST  (INCLUDING MRCP) TECHNIQUE: Multiplanar multisequence MR imaging of the abdomen was performed. Heavily T2-weighted images of the biliary and pancreatic ducts were obtained, and three-dimensional MRCP images were rendered by post processing. COMPARISON:  CT the abdomen and pelvis 03/05/2020. FINDINGS: Comment: Today's study is limited for detection and characterization of visceral and/or vascular lesions by lack of IV gadolinium. There is also considerable respiratory motion on many of the pulse sequences. Lower chest: Cardiomegaly. Trace right pleural effusion lying dependently. Hepatobiliary: No definite suspicious cystic or solid hepatic lesions are confidently identified on today's noncontrast examination. MRCP images demonstrate very mild prominence of the intrahepatic bile ducts. Common bile duct is currently normal in size measuring up to 6 mm in the porta hepatis. The distal common bile duct  immediately before the level of the ampulla is not  visualized. No definite filling defect in the common bile duct to clearly indicate the presence of choledocholithiasis. Status post cholecystectomy. Pancreas: No pancreatic mass. MRCP images again demonstrate diffuse pancreatic ductal dilatation which extends to the level of the ampulla with the main pancreatic duct measuring up to 6 mm in the body and head of the pancreas. No peripancreatic fluid collections or inflammatory changes. Spleen: Spleen appears borderline enlarged measuring 11.1 x 5.0 x 10.6 cm (estimated splenic volume of 294 mL). Adrenals/Urinary Tract: Unenhanced appearance of the kidneys and bilateral adrenal glands is grossly unremarkable. No hydroureteronephrosis in the visualized portions of the abdomen. Stomach/Bowel: Moderate gaseous distension in the visualized colon. Otherwise, unremarkable. Vascular/Lymphatic: No aneurysm identified in the visualized abdominal vasculature. No lymphadenopathy noted in the abdomen. Other: No significant volume of ascites noted in the visualized portions of the peritoneal cavity. Musculoskeletal: No aggressive appearing osseous lesions are noted in the visualized portions of the skeleton. IMPRESSION: 1. Persistent diffuse pancreatic ductal dilatation, similar to prior exam from 01/02/2020. Compared to the prior study, the degree of common bile duct dilatation has decreased and is now within normal limits, although there continues to be some very mild prominence of the intrahepatic bile ducts. The distal common bile duct at and immediately proximal to the ampulla is poorly demonstrated on today's noncontrast examination. Although there is no discrete filling defect to suggest choledocholithiasis, the lack of visualization makes it difficult to exclude the presence of biliary sludge in the distal common bile duct or obstructing ampullary lesion on today's noncontrast examination. 2. Cardiomegaly. 3. Trace right  pleural effusion lying dependently. Electronically Signed   By: Vinnie Langton M.D.   On: 03/07/2020 07:13   IR Fluoro Guide CV Line Left  Result Date: 03/09/2020 CLINICAL DATA:  Crohn's disease, difficult peripheral access, long-term indwelling left tunneled femoral venous catheter. The subcutaneous cuff has pulled back. Sepsis, catheter exchange requested EXAM: TUNNELED CENTRAL VENOUS CATHETER EXCHANGE/REVISION FLUOROSCOPIC GUIDANCE TECHNIQUE: The procedure, risks, benefits, and alternatives were explained to the patient. Questions regarding the procedure were encouraged and answered. The patient understands and consents to the procedure. As antibiotic prophylaxis, VANCOMYCIN 1 G was ordered pre-procedure and administered intravenously within one hour of incision. The left femoral tunneled dual-lumen central venous catheter and surrounding skin were prepped using maximum barrier technique including cap and mask, sterile gown, sterile gloves, large sterile sheet, and Chlorhexidine as cutaneous antisepsis. The region was infiltrated locally with 1% lidocaine. Intravenous Fentanyl 136mg and Versed 279mwere administered as conscious sedation during continuous monitoring of the patient's level of consciousness and physiological / cardiorespiratory status by the radiology RN, with a total moderate sedation time of 14 minutes. The catheter was cut and withdrawn over a 018 wire, over which a new 80F dual-lumen cuffed 39 cm power injectable catheter was advanced , positioned with its tip in the infrarenal IVC. Both lumens flushed and aspirated well. Spot radiograph confirms good catheter position. Catheter was flushed per protocol. Catheter secured externally with O Prolene suture. The patient tolerated the procedure well. COMPLICATIONS: COMPLICATIONS None immediate FLUOROSCOPY TIME:  6 seconds COMPARISON:  None IMPRESSION: 1. Technically successful exchange and revision of tunneled left femoral dual-lumen  power-injectable central venous catheter. Electronically Signed   By: D Lucrezia Europe.D.   On: 03/09/2020 15:49   DG Chest Port 1 View  Result Date: 03/05/2020 CLINICAL DATA:  Fever. EXAM: PORTABLE CHEST 1 VIEW COMPARISON:  October 28, 2019 FINDINGS: The lungs are hyperinflated. There is no evidence of acute  infiltrate, pleural effusion or pneumothorax. Radiopaque surgical clips are seen overlying the suprahilar region on the right and lateral aspect of the right lung base. The heart size and mediastinal contours are within normal limits. The visualized skeletal structures are unremarkable. IMPRESSION: No active disease. Electronically Signed   By: Virgina Norfolk M.D.   On: 03/05/2020 01:44   DG ERCP  Result Date: 03/08/2020 CLINICAL DATA:  59 year old female with pancreatic ductal dilatation. EXAM: ERCP TECHNIQUE: Multiple spot images obtained with the fluoroscopic device and submitted for interpretation post-procedure. FLUOROSCOPY TIME:  Fluoroscopy Time:  4 minutes 46 seconds COMPARISON:  None. FINDINGS: Limited intraoperative fluoroscopic spot images during ERCP. Initial image demonstrates the endoscope projecting over the upper abdomen. Subsequently there is cannulation of the ampulla with a safety wire and retrograde infusion of contrast partially opacifying the extrahepatic biliary ducts. Deployment of a retrieval balloon. There is also evidence of PTA of the distal common bile duct/ampulla Surgical changes of cholecystectomy. IMPRESSION: Limited images during ERCP demonstrates deployment of a retrieval balloon and PTA of the extrahepatic biliary system at the ampulla. Please refer to the dictated operative report for full details of intraoperative findings and procedure. Electronically Signed   By: Corrie Mckusick D.O.   On: 03/08/2020 15:17   MM 3D SCREEN BREAST BILATERAL  Addendum Date: 03/09/2020   ADDENDUM REPORT: 03/09/2020 14:52 ADDENDUM: Prior outside films have been obtained without  significant change. Electronically Signed   By: Margarette Canada M.D.   On: 03/09/2020 14:52   Result Date: 03/09/2020 CLINICAL DATA:  Screening. EXAM: DIGITAL SCREENING BILATERAL MAMMOGRAM WITH TOMO AND CAD COMPARISON:  None. ACR Breast Density Category b: There are scattered areas of fibroglandular density. FINDINGS: There are no findings suspicious for malignancy. Images were processed with CAD. IMPRESSION: No mammographic evidence of malignancy. A result letter of this screening mammogram will be mailed directly to the patient. RECOMMENDATION: Screening mammogram in one year. (Code:SM-B-01Y) BI-RADS CATEGORY  1: Negative. Electronically Signed: By: Margarette Canada M.D. On: 03/07/2020 15:19      Subjective: - no chest pain, shortness of breath, no abdominal pain, nausea or vomiting.   Discharge Exam: BP (!) 153/78 (BP Location: Right Arm)   Pulse 61   Temp 98.3 F (36.8 C) (Oral)   Resp 18   Ht 5' 8"  (1.727 m)   Wt 66.9 kg   SpO2 94%   BMI 22.43 kg/m   General: Pt is alert, awake, not in acute distress Cardiovascular: RRR, S1/S2 +, no rubs, no gallops Respiratory: CTA bilaterally, no wheezing, no rhonchi Abdominal: Soft, NT, ND, bowel sounds + Extremities: no edema, no cyanosis    The results of significant diagnostics from this hospitalization (including imaging, microbiology, ancillary and laboratory) are listed below for reference.     Microbiology: Recent Results (from the past 240 hour(s))  Respiratory Panel by RT PCR (Flu A&B, Covid) - Nasopharyngeal Swab     Status: None   Collection Time: 03/05/20 12:48 AM   Specimen: Nasopharyngeal Swab  Result Value Ref Range Status   SARS Coronavirus 2 by RT PCR NEGATIVE NEGATIVE Final    Comment: (NOTE) SARS-CoV-2 target nucleic acids are NOT DETECTED. The SARS-CoV-2 RNA is generally detectable in upper respiratoy specimens during the acute phase of infection. The lowest concentration of SARS-CoV-2 viral copies this assay can detect  is 131 copies/mL. A negative result does not preclude SARS-Cov-2 infection and should not be used as the sole basis for treatment or other patient management decisions. A  negative result may occur with  improper specimen collection/handling, submission of specimen other than nasopharyngeal swab, presence of viral mutation(s) within the areas targeted by this assay, and inadequate number of viral copies (<131 copies/mL). A negative result must be combined with clinical observations, patient history, and epidemiological information. The expected result is Negative. Fact Sheet for Patients:  PinkCheek.be Fact Sheet for Healthcare Providers:  GravelBags.it This test is not yet ap proved or cleared by the [place] FDA and  has been authorized for detection and/or diagnosis of SARS-CoV-2 by FDA under an Emergency Use Authorization (EUA). This EUA will remain  in effect (meaning this test can be used) for the duration of the COVID-19 declaration under Section 564(b)(1) of the Act, 21 U.S.C. section 360bbb-3(b)(1), unless the authorization is terminated or revoked sooner.    Influenza A by PCR NEGATIVE NEGATIVE Final   Influenza B by PCR NEGATIVE NEGATIVE Final    Comment: (NOTE) The Xpert Xpress SARS-CoV-2/FLU/RSV assay is intended as an aid in  the diagnosis of influenza from Nasopharyngeal swab specimens and  should not be used as a sole basis for treatment. Nasal washings and  aspirates are unacceptable for Xpert Xpress SARS-CoV-2/FLU/RSV  testing. Fact Sheet for Patients: PinkCheek.be Fact Sheet for Healthcare Providers: GravelBags.it This test is not yet approved or cleared by the [place] FDA and  has been authorized for detection and/or diagnosis of SARS-CoV-2 by  FDA under an Emergency Use Authorization (EUA). This EUA will remain  in effect (meaning this  test can be used) for the duration of the  Covid-19 declaration under Section 564(b)(1) of the Act, 21  U.S.C. section 360bbb-3(b)(1), unless the authorization is  terminated or revoked. Performed at [facility], [place] [address] [place], [place] [address]   Blood Culture (routine x 2)     Status: None (Preliminary result)   Collection Time: 03/05/20 12:51 AM   Specimen: BLOOD  Result Value Ref Range Status   Specimen Description BLOOD RIGHT WRIST  Final   Special Requests   Final    BOTTLES DRAWN AEROBIC AND ANAEROBIC Blood Culture results may not be optimal due to an inadequate volume of blood received in culture bottles Performed at [facility] [address] [place], [place] [address]    Culture NO GROWTH 4 DAYS  Final   Report Status PENDING  Incomplete  Blood Culture (routine x 2)     Status: Abnormal   Collection Time: 03/05/20  1:06 AM   Specimen: BLOOD  Result Value Ref Range Status   Specimen Description BLOOD LEFT FEMORAL ARTERY  Final   Special Requests   Final    BOTTLES DRAWN AEROBIC AND ANAEROBIC Blood Culture results may not be optimal due to an excessive volume of blood received in culture bottles   Culture  Setup Time   Final    GRAM NEGATIVE RODS IN BOTH AEROBIC AND ANAEROBIC BOTTLES CRITICAL RESULT CALLED TO, READ BACK BY AND VERIFIED WITH: PHARMD K HURTH [id number] AT 1726 BY CM Performed at [facility], [place] [address], [place], [place] [address]    Culture STENOTROPHOMONAS MALTOPHILIA (A)  Final   Report Status 03/07/2020 FINAL  Final   Organism ID, Bacteria STENOTROPHOMONAS MALTOPHILIA  Final      Susceptibility   Stenotrophomonas maltophilia - MIC*    LEVOFLOXACIN 0.5 SENSITIVE Sensitive     TRIMETH/SULFA <=20 SENSITIVE Sensitive     * STENOTROPHOMONAS MALTOPHILIA  Blood Culture ID Panel (Reflexed)  Status: None   Collection Time: 03/05/20  1:06 AM  Result Value Ref Range Status   Enterococcus species NOT DETECTED NOT DETECTED  Final   Listeria monocytogenes NOT DETECTED NOT DETECTED Final   Staphylococcus species NOT DETECTED NOT DETECTED Final   Staphylococcus aureus (BCID) NOT DETECTED NOT DETECTED Final   Streptococcus species NOT DETECTED NOT DETECTED Final   Streptococcus agalactiae NOT DETECTED NOT DETECTED Final   Streptococcus pneumoniae NOT DETECTED NOT DETECTED Final   Streptococcus pyogenes NOT DETECTED NOT DETECTED Final   Acinetobacter baumannii NOT DETECTED NOT DETECTED Final   Enterobacteriaceae species NOT DETECTED NOT DETECTED Final   Enterobacter cloacae complex NOT DETECTED NOT DETECTED Final   Escherichia coli NOT DETECTED NOT DETECTED Final   Klebsiella oxytoca NOT DETECTED NOT DETECTED Final   Klebsiella pneumoniae NOT DETECTED NOT DETECTED Final   Proteus species NOT DETECTED NOT DETECTED Final   Serratia marcescens NOT DETECTED NOT DETECTED Final   Haemophilus influenzae NOT DETECTED NOT DETECTED Final   Neisseria meningitidis NOT DETECTED NOT DETECTED Final   Pseudomonas aeruginosa NOT DETECTED NOT DETECTED Final   Candida albicans NOT DETECTED NOT DETECTED Final   Candida glabrata NOT DETECTED NOT DETECTED Final   Candida krusei NOT DETECTED NOT DETECTED Final   Candida parapsilosis NOT DETECTED NOT DETECTED Final   Candida tropicalis NOT DETECTED NOT DETECTED Final    Comment: Performed at [facility], [place]. [address] [place], [place] [address]  Urine culture     Status: Abnormal   Collection Time: 03/05/20  8:05 AM   Specimen: In/Out Cath Urine  Result Value Ref Range Status   Specimen Description IN/OUT CATH URINE  Final   Special Requests   Final    NONE Performed at [facility], [place] [address], [place], [place] [address]    Culture 30,000 COLONIES/mL STAPHYLOCOCCUS EPIDERMIDIS (A)  Final   Report Status 03/07/2020 FINAL  Final   Organism ID, Bacteria STAPHYLOCOCCUS EPIDERMIDIS (A)  Final      Susceptibility   Staphylococcus epidermidis - MIC*     CIPROFLOXACIN 4 RESISTANT Resistant     GENTAMICIN <=0.5 SENSITIVE Sensitive     NITROFURANTOIN <=16 SENSITIVE Sensitive     OXACILLIN >=4 RESISTANT Resistant     TETRACYCLINE >=16 RESISTANT Resistant     TRIMETH/SULFA <=10 SENSITIVE Sensitive     CLINDAMYCIN >=8 RESISTANT Resistant     RIFAMPIN 1 SENSITIVE Sensitive     Inducible Clindamycin NEGATIVE Sensitive     * 30,000 COLONIES/mL STAPHYLOCOCCUS EPIDERMIDIS     Labs: Basic Metabolic Panel: Recent Labs  Lab 03/06/20 0623 03/07/20 0508 03/08/20 0612 03/09/20 0934 03/10/20 0500  NA 138 136 136 134* 139  K 4.0 4.0 3.8 3.4* 3.5  CL 109 106 106 106 110  CO2 20* 21* 23 17* 22  GLUCOSE 68* 105* 93 70 85  BUN 26* 16 11 21* 18  CREATININE 1.41* 1.53* 1.64* 2.05* 1.88*  CALCIUM 8.9 9.1 9.1 8.5* 9.0  MG 1.9 1.8  --   --  1.6*  PHOS 2.6 3.0  --   --  2.8   Liver Function Tests: Recent Labs  Lab 03/05/20 0048 03/06/20 0623 03/07/20 0508 03/08/20 0612 03/10/20 0500  AST 884* 556* 243* 122* 41  ALT 607* 912* 645* 460* 233*  ALKPHOS 253* 285* 262* 270* 247*  BILITOT 1.0 2.4* 1.4* 1.1 0.9  PROT 6.8 6.4* 6.3* 6.4* 6.4*  ALBUMIN 3.1* 2.6* 2.7* 2.7* 2.9*   CBC:  Recent Labs  Lab 03/05/20 0048 03/06/20 0623 03/07/20 0508 03/10/20 0500  WBC 1.5* 5.9 5.3 2.7*  NEUTROABS 1.3* 4.9 3.8  --   HGB 9.9* 9.4* 8.7* 8.0*  HCT 30.6* 29.6* 26.6* 24.3*  MCV 95.9 94.3 92.7 92.7  PLT 123* 97* 101* 122*   CBG: No results for input(s): GLUCAP in the last 168 hours. Hgb A1c No results for input(s): HGBA1C in the last 72 hours. Lipid Profile No results for input(s): CHOL, HDL, LDLCALC, TRIG, CHOLHDL, LDLDIRECT in the last 72 hours. Thyroid function studies No results for input(s): TSH, T4TOTAL, T3FREE, THYROIDAB in the last 72 hours.  Invalid input(s): FREET3 Urinalysis    Component Value Date/Time   COLORURINE YELLOW 03/05/2020 [place] 03/05/2020 0753   LABSPEC 1.024 03/05/2020 0753   PHURINE 7.0 03/05/2020  0753   GLUCOSEU NEGATIVE 03/05/2020 0753   HGBUR NEGATIVE 03/05/2020 0753   BILIRUBINUR NEGATIVE 03/05/2020 [place] 03/05/2020 0753   PROTEINUR 100 (A) 03/05/2020 0753   NITRITE NEGATIVE 03/05/2020 0753   LEUKOCYTESUR NEGATIVE 03/05/2020 0753    FURTHER DISCHARGE INSTRUCTIONS:   Get Medicines reviewed and adjusted: Please take all your medications with you for your next visit with your Primary MD   Laboratory/radiological data: Please request your Primary MD to go over all hospital tests and procedure/radiological results at the follow up, please ask your Primary MD to get all Hospital records sent to his/her office.   In some cases, they will be blood work, cultures and biopsy results pending at the time of your discharge. Please request that your primary care M.D. goes through all the records of your hospital data and follows up on these results.   Also Note the following: If you experience worsening of your admission symptoms, develop shortness of breath, life threatening emergency, suicidal or homicidal thoughts you must seek medical attention immediately by calling 911 or calling your MD immediately  if symptoms less severe.   You must read complete instructions/literature along with all the possible adverse reactions/side effects for all the Medicines you take and that have been prescribed to you. Take any new Medicines after you have completely understood and accpet all the possible adverse reactions/side effects.    Do not drive when taking Pain medications or sleeping medications (Benzodaizepines)   Do not take more than prescribed Pain, Sleep and Anxiety Medications. It is not advisable to combine anxiety,sleep and pain medications without talking with your primary care practitioner   Special Instructions: If you have smoked or chewed Tobacco  in the last 2 yrs please stop smoking, stop any regular Alcohol  and or any Recreational drug use.   Wear Seat belts  while driving.   Please note: You were cared for by a hospitalist during your hospital stay. Once you are discharged, your primary care physician will handle any further medical issues. Please note that NO REFILLS for any discharge medications will be authorized once you are discharged, as it is imperative that you return to your primary care physician (or establish a relationship with a primary care physician if you do not have one) for your post hospital discharge needs so that they can reassess your need for medications and monitor your lab values.  Time coordinating discharge: 40 minutes  SIGNED:  Marzetta Board, MD, PhD 03/10/2020, 8:48 AM

## 2020-03-10 NOTE — Plan of Care (Signed)

## 2020-03-10 NOTE — TOC Transition Note (Signed)
Transition of Care [facility]) - CM/SW Discharge Note   Patient Details  Name: Caitlin Peters MRN: [id number] Date of Birth: 04-Nov-1961  Transition of Care [facility]) CM/SW Contact:  Atilano Median, LCSW Phone Number: 03/10/2020, 2:49 PM   Clinical Narrative:     Discharged home with home health services for IV abx and TPN. Resumption orders sent to [facility]. [facility] following for TPN and IV abx. Carolynn Sayers will meet with patient at the bedside to provide teaching. Family will provide transport home. No other needs at this time. Case closed to this CSW.   Final next level of care: Home w Home Health Services Barriers to Discharge: Barriers Resolved   Patient Goals and CMS Choice   CMS Medicare.gov Compare Post Acute Care list provided to:: Patient Choice offered to / list presented to : Patient  Discharge Placement                       Discharge Plan and Services                          HH Arranged: RN [facility] Agency: [facility] Date [place]: 03/10/20 Time [place]: [place] Representative spoke with at [place]: [place] (Ord) Interventions     Readmission Risk Interventions Readmission Risk Prevention Plan 11/30/2019 10/30/2019  Transportation Screening Complete Complete  PCP or Specialist Appt within 3-5 Days - Not Complete  Not Complete comments - pending d/c date to arrange  [place] or [place] - Complete  Social Work Consult for [place] Planning/Counseling - Not Complete  SW consult not completed comments - RNCM met with pt  Palliative Care Screening - Not Applicable  Medication Review Press photographer) Complete Complete  PCP or Specialist appointment within 3-5 days of discharge Complete -  [place] or [place] Complete -  SW Recovery Care/Counseling Consult Complete -  Palliative Care Screening Not Applicable -  [place] Not Applicable -

## 2020-03-11 NOTE — Discharge Instructions (Signed)
Sepsis, Diagnosis, Adult Sepsis is a serious bodily reaction to an infection. The infection that triggers sepsis may be from a bacteria, virus, or fungus. Sepsis can result from an infection in any part of your body. Infections that commonly lead to sepsis include skin, lung, and urinary tract infections. Sepsis is a medical emergency that must be treated right away in a hospital. In severe cases, it can lead to septic shock. Septic shock can weaken your heart and cause your blood pressure to drop. This can cause your central nervous system and your body's organs to stop working. What are the causes? This condition is caused by a severe reaction to infections from bacteria, viruses, or fungus. The germs that most often lead to sepsis include:  Escherichia coli (E. coli) bacteria.  Staphylococcus aureus (staph) bacteria.  Some types of Streptococcus bacteria. The most common infections affect these organs:  The lung (pneumonia).  The kidneys or bladder (urinary tract infection).  The skin (cellulitis).  The bowel, gallbladder, or pancreas. What increases the risk? You are more likely to develop this condition if:  Your body's disease-fighting system (immune system) is weakened.  You are age 59 or older.  You are female.  You had surgery or you have been hospitalized.  You have these devices inserted into your body: ? A small, thin tube (catheter). ? IV line. ? Breathing tube. ? Drainage tube.  You are not getting enough nutrients from food (malnourished).  You have a long-term (chronic) disease, such as cancer, lung disease, kidney disease, or diabetes.  You are African American. What are the signs or symptoms? Symptoms of this condition may include:  Fever.  Chills or feeling very cold.  Confusion or anxiety.  Fatigue.  Muscle aches.  Shortness of breath.  Nausea and vomiting.  Urinating much less than usual.  Fast heart rate (tachycardia).  Rapid  breathing (hyperventilation).  Changes in skin color. Your skin may look blotchy, pale, or blue.  Cool, clammy, or sweaty skin.  Skin rash. Other symptoms depend on the source of your infection. How is this diagnosed? This condition is diagnosed based on:  Your symptoms.  Your medical history.  A physical exam. Other tests may also be done to find out the cause of the infection and how severe the sepsis is. These tests may include:  Blood tests.  Urine tests.  Swabs from other areas of your body that may have an infection. These samples may be tested (cultured) to find out what type of bacteria is causing the infection.  Chest X-ray to check for pneumonia. Other imaging tests, such as a CT scan, may also be done.  Lumbar puncture. This removes a small amount of the fluid that surrounds your brain and spinal cord. The fluid is then examined for infection. How is this treated? This condition must be treated in a hospital. Based on the cause of your infection, you may be given an antibiotic, antiviral, or antifungal medicine. You may also receive:  Fluids through an IV.  Oxygen and breathing assistance.  Medicines to increase your blood pressure.  Kidney dialysis. This process cleans your blood if your kidneys have failed.  Surgery to remove infected tissue.  Blood transfusion if needed.  Medicine to prevent blood clots.  Nutrients to correct imbalances in basic body function (metabolism). You may: ? Receive important salts and minerals (electrolytes) through an IV. ? Have your blood sugar level adjusted. Follow these instructions at home: Medicines   Take over-the-counter and  prescription medicines only as told by your health care provider.  If you were prescribed an antibiotic, antiviral, or antifungal medicine, take it as told by your health care provider. Do not stop taking the medicine even if you start to feel better. General instructions  If you have a  catheter or other indwelling device, ask to have it removed as soon as possible.  Keep all follow-up visits as told by your health care provider. This is important. Contact a health care provider if:  You do not feel like you are getting better or regaining strength.  You are having trouble coping with your recovery.  You frequently feel tired.  You feel worse or do not seem to get better after surgery.  You think you may have an infection after surgery. Get help right away if:  You have any symptoms of sepsis.  You have difficulty breathing.  You have a rapid or skipping heartbeat.  You become confused or disoriented.  You have a high fever.  Your skin becomes blotchy, pale, or blue.  You have an infection that is getting worse or not getting better. These symptoms may represent a serious problem that is an emergency. Do not wait to see if the symptoms will go away. Get medical help right away. Call your local emergency services (911 in the [place] Do not drive yourself to the hospital. Summary  Sepsis is a medical emergency that requires immediate treatment in a hospital.  This condition is caused by a severe reaction to infections from bacteria, viruses, or fungus.  Based on the cause of your infection, you may be given an antibiotic, antiviral, or antifungal medicine.  Treatment may also include IV fluids, breathing assistance, and kidney dialysis. This information is not intended to replace advice given to you by your health care provider. Make sure you discuss any questions you have with your health care provider. Document Revised: 06/20/2018 Document Reviewed: 06/20/2018 Elsevier Patient Education  [place].  Hypertension, Adult Hypertension is another name for high blood pressure. High blood pressure forces your heart to work harder to pump blood. This can cause problems over time. There are two numbers in a blood pressure reading. There is a top number  (systolic) over a bottom number (diastolic). It is best to have a blood pressure that is below 120/80. Healthy choices can help lower your blood pressure, or you may need medicine to help lower it. What are the causes? The cause of this condition is not known. Some conditions may be related to high blood pressure. What increases the risk?  Smoking.  Having type 2 diabetes mellitus, high cholesterol, or both.  Not getting enough exercise or physical activity.  Being overweight.  Having too much fat, sugar, calories, or salt (sodium) in your diet.  Drinking too much alcohol.  Having long-term (chronic) kidney disease.  Having a family history of high blood pressure.  Age. Risk increases with age.  Race. You may be at higher risk if you are African American.  Gender. Men are at higher risk than women before age 58. After age 21, women are at higher risk than men.  Having obstructive sleep apnea.  Stress. What are the signs or symptoms?  High blood pressure may not cause symptoms. Very high blood pressure (hypertensive crisis) may cause: ? Headache. ? Feelings of worry or nervousness (anxiety). ? Shortness of breath. ? Nosebleed. ? A feeling of being sick to your stomach (nausea). ? Throwing up (vomiting). ? Changes  in how you see. ? Very bad chest pain. ? Seizures. How is this treated?  This condition is treated by making healthy lifestyle changes, such as: ? Eating healthy foods. ? Exercising more. ? Drinking less alcohol.  Your health care provider may prescribe medicine if lifestyle changes are not enough to get your blood pressure under control, and if: ? Your top number is above 130. ? Your bottom number is above 80.  Your personal target blood pressure may vary. Follow these instructions at home: Eating and drinking   If told, follow the DASH eating plan. To follow this plan: ? Fill one half of your plate at each meal with fruits and vegetables. ? Fill  one fourth of your plate at each meal with whole grains. Whole grains include whole-wheat pasta, brown rice, and whole-grain bread. ? Eat or drink low-fat dairy products, such as skim milk or low-fat yogurt. ? Fill one fourth of your plate at each meal with low-fat (lean) proteins. Low-fat proteins include fish, chicken without skin, eggs, beans, and tofu. ? Avoid fatty meat, cured and processed meat, or chicken with skin. ? Avoid pre-made or processed food.  Eat less than 1,500 mg of salt each day.  Do not drink alcohol if: ? Your doctor tells you not to drink. ? You are pregnant, may be pregnant, or are planning to become pregnant.  If you drink alcohol: ? Limit how much you use to:  0-1 drink a day for women.  0-2 drinks a day for men. ? Be aware of how much alcohol is in your drink. In the [place] one drink equals one 12 oz bottle of beer (355 mL), one 5 oz glass of wine (148 mL), or one 1 oz glass of hard liquor (44 mL). Lifestyle   Work with your doctor to stay at a healthy weight or to lose weight. Ask your doctor what the best weight is for you.  Get at least 30 minutes of exercise most days of the week. This may include walking, swimming, or biking.  Get at least 30 minutes of exercise that strengthens your muscles (resistance exercise) at least 3 days a week. This may include lifting weights or doing Pilates.  Do not use any products that contain nicotine or tobacco, such as cigarettes, e-cigarettes, and chewing tobacco. If you need help quitting, ask your doctor.  Check your blood pressure at home as told by your doctor.  Keep all follow-up visits as told by your doctor. This is important. Medicines  Take over-the-counter and prescription medicines only as told by your doctor. Follow directions carefully.  Do not skip doses of blood pressure medicine. The medicine does not work as well if you skip doses. Skipping doses also puts you at risk for problems.  Ask your  doctor about side effects or reactions to medicines that you should watch for. Contact a doctor if you:  Think you are having a reaction to the medicine you are taking.  Have headaches that keep coming back (recurring).  Feel dizzy.  Have swelling in your ankles.  Have trouble with your vision. Get help right away if you:  Get a very bad headache.  Start to feel mixed up (confused).  Feel weak or numb.  Feel faint.  Have very bad pain in your: ? Chest. ? Belly (abdomen).  Throw up more than once.  Have trouble breathing. Summary  Hypertension is another name for high blood pressure.  High blood pressure forces your heart  to work harder to pump blood.  For most people, a normal blood pressure is less than 120/80.  Making healthy choices can help lower blood pressure. If your blood pressure does not get lower with healthy choices, you may need to take medicine. This information is not intended to replace advice given to you by your health care provider. Make sure you discuss any questions you have with your health care provider. Document Revised: 07/23/2018 Document Reviewed: 07/23/2018 Elsevier Patient Education  2020 [employer].

## 2020-03-14 ENCOUNTER — Telehealth: Payer: Self-pay | Admitting: *Deleted

## 2020-03-14 NOTE — Telephone Encounter (Signed)
Attempted to contact patient. No answer. LVM for patient to return call.

## 2020-03-16 ENCOUNTER — Telehealth: Payer: Self-pay | Admitting: Infectious Disease

## 2020-03-16 NOTE — Telephone Encounter (Signed)
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

## 2020-03-17 ENCOUNTER — Telehealth: Payer: Self-pay

## 2020-03-17 NOTE — Telephone Encounter (Signed)
Patient called office today with questions for MD. Patient would like to know if she would be able to get vaccine after being discharged from Hospital. She is scheduled to get vaccine and would like to know if she is okay with pursuing. [name]

## 2020-03-17 NOTE — Telephone Encounter (Signed)
definitely

## 2020-03-17 NOTE — Telephone Encounter (Signed)
Spoke with [place] office managing patients TPN + abx regimen. Relayed orders to change from Bactrim to IV levaquin every other day, 572m IV. Will check to see if patient has received levaquin before, if not will require first dose at Short Stay. Will forward to provider for orders.  Orlandus Borowski RLorita Officer RN

## 2020-03-17 NOTE — Telephone Encounter (Signed)
Confirmed with pharmacy that patient received oral Levaquin in January; will not need to go to short stay for first dose. Confirmed that the end date will remain 03/30/20. Pharmacist stated that they'll have patient begin IV levaquin on 4/23. Provider notified.   Angad Nabers Lorita Officer, RN

## 2020-03-21 ENCOUNTER — Other Ambulatory Visit: Payer: Self-pay | Admitting: Internal Medicine

## 2020-03-21 ENCOUNTER — Ambulatory Visit: Payer: Medicare Other | Attending: Internal Medicine

## 2020-03-21 DIAGNOSIS — Z23 Encounter for immunization: Secondary | ICD-10-CM

## 2020-03-21 NOTE — Progress Notes (Signed)
   Covid-19 Vaccination Clinic  Name:  ROYAL BEIRNE    MRN: [id number] DOB: [birth year]  03/21/2020  Ms. Santore was observed post Covid-19 immunization for 30 minutes based on pre-vaccination screening without incident. She was provided with Vaccine Information Sheet and instruction to access the V-Safe system.   Ms. Kohli was instructed to call 911 with any severe reactions post vaccine: [name] Kitchen Difficulty breathing  . Swelling of face and throat  . A fast heartbeat  . A bad rash all over body  . Dizziness and weakness   Immunizations Administered    Name Date Dose VIS Date Route   Pfizer COVID-19 Vaccine 03/21/2020  5:04 PM 0.3 mL 01/20/2019 Intramuscular   Manufacturer: [place]   Lot: [id number]   [place]: [id number]

## 2020-03-31 ENCOUNTER — Encounter: Payer: Self-pay | Admitting: Infectious Disease

## 2020-04-01 ENCOUNTER — Other Ambulatory Visit: Payer: Self-pay | Admitting: Internal Medicine

## 2020-04-01 DIAGNOSIS — K50818 Crohn's disease of both small and large intestine with other complication: Secondary | ICD-10-CM

## 2020-04-10 ENCOUNTER — Other Ambulatory Visit: Payer: Self-pay | Admitting: Internal Medicine

## 2020-04-11 ENCOUNTER — Telehealth: Payer: Self-pay | Admitting: Infectious Disease

## 2020-04-11 NOTE — Telephone Encounter (Signed)
And cr was better its all good

## 2020-04-11 NOTE — Telephone Encounter (Signed)
Seeing labs with her serum creatinine above 2 and mention of being on Bactrim I thought she was changed to a fluoroquinolone?

## 2020-04-11 NOTE — Telephone Encounter (Signed)
Yes it looks like she was switched in April by Dorie. Sorry my mistake.

## 2020-04-13 ENCOUNTER — Ambulatory Visit (INDEPENDENT_AMBULATORY_CARE_PROVIDER_SITE_OTHER): Payer: Medicare Other | Admitting: Infectious Disease

## 2020-04-13 ENCOUNTER — Observation Stay (HOSPITAL_COMMUNITY): Payer: Medicare Other

## 2020-04-13 ENCOUNTER — Other Ambulatory Visit: Payer: Self-pay

## 2020-04-13 ENCOUNTER — Encounter: Payer: Self-pay | Admitting: Infectious Disease

## 2020-04-13 ENCOUNTER — Inpatient Hospital Stay (HOSPITAL_COMMUNITY)
Admission: AD | Admit: 2020-04-13 | Discharge: 2020-04-18 | DRG: 386 | Disposition: A | Discharge status: Home Health | Payer: Medicare Other | Source: Ambulatory Visit | Attending: Internal Medicine | Admitting: Internal Medicine

## 2020-04-13 ENCOUNTER — Telehealth: Payer: Self-pay

## 2020-04-13 VITALS — BP 169/81 | HR 66 | Temp 97.9°F | Wt 148.0 lb

## 2020-04-13 DIAGNOSIS — Z79890 Hormone replacement therapy (postmenopausal): Secondary | ICD-10-CM

## 2020-04-13 DIAGNOSIS — M545 Low back pain: Secondary | ICD-10-CM | POA: Diagnosis present

## 2020-04-13 DIAGNOSIS — Z79891 Long term (current) use of opiate analgesic: Secondary | ICD-10-CM

## 2020-04-13 DIAGNOSIS — N189 Chronic kidney disease, unspecified: Secondary | ICD-10-CM | POA: Diagnosis present

## 2020-04-13 DIAGNOSIS — E876 Hypokalemia: Secondary | ICD-10-CM | POA: Diagnosis present

## 2020-04-13 DIAGNOSIS — K219 Gastro-esophageal reflux disease without esophagitis: Secondary | ICD-10-CM | POA: Diagnosis present

## 2020-04-13 DIAGNOSIS — R109 Unspecified abdominal pain: Secondary | ICD-10-CM | POA: Diagnosis not present

## 2020-04-13 DIAGNOSIS — K5090 Crohn's disease, unspecified, without complications: Secondary | ICD-10-CM | POA: Diagnosis present

## 2020-04-13 DIAGNOSIS — K5010 Crohn's disease of large intestine without complications: Principal | ICD-10-CM | POA: Diagnosis present

## 2020-04-13 DIAGNOSIS — K8500 Idiopathic acute pancreatitis without necrosis or infection: Secondary | ICD-10-CM

## 2020-04-13 DIAGNOSIS — G894 Chronic pain syndrome: Secondary | ICD-10-CM | POA: Diagnosis present

## 2020-04-13 DIAGNOSIS — Z6822 Body mass index (BMI) 22.0-22.9, adult: Secondary | ICD-10-CM

## 2020-04-13 DIAGNOSIS — Z881 Allergy status to other antibiotic agents status: Secondary | ICD-10-CM

## 2020-04-13 DIAGNOSIS — D638 Anemia in other chronic diseases classified elsewhere: Secondary | ICD-10-CM | POA: Diagnosis present

## 2020-04-13 DIAGNOSIS — Z79899 Other long term (current) drug therapy: Secondary | ICD-10-CM

## 2020-04-13 DIAGNOSIS — M810 Age-related osteoporosis without current pathological fracture: Secondary | ICD-10-CM | POA: Diagnosis present

## 2020-04-13 DIAGNOSIS — R300 Dysuria: Secondary | ICD-10-CM | POA: Diagnosis present

## 2020-04-13 DIAGNOSIS — Z789 Other specified health status: Secondary | ICD-10-CM | POA: Diagnosis not present

## 2020-04-13 DIAGNOSIS — F329 Major depressive disorder, single episode, unspecified: Secondary | ICD-10-CM | POA: Diagnosis present

## 2020-04-13 DIAGNOSIS — Z888 Allergy status to other drugs, medicaments and biological substances status: Secondary | ICD-10-CM

## 2020-04-13 DIAGNOSIS — Z87891 Personal history of nicotine dependence: Secondary | ICD-10-CM

## 2020-04-13 DIAGNOSIS — K5790 Diverticulosis of intestine, part unspecified, without perforation or abscess without bleeding: Secondary | ICD-10-CM | POA: Diagnosis present

## 2020-04-13 DIAGNOSIS — E440 Moderate protein-calorie malnutrition: Secondary | ICD-10-CM | POA: Diagnosis present

## 2020-04-13 DIAGNOSIS — Z800 Family history of malignant neoplasm of digestive organs: Secondary | ICD-10-CM

## 2020-04-13 DIAGNOSIS — A498 Other bacterial infections of unspecified site: Secondary | ICD-10-CM | POA: Diagnosis not present

## 2020-04-13 DIAGNOSIS — N179 Acute kidney failure, unspecified: Secondary | ICD-10-CM | POA: Diagnosis present

## 2020-04-13 DIAGNOSIS — K50818 Crohn's disease of both small and large intestine with other complication: Secondary | ICD-10-CM | POA: Diagnosis present

## 2020-04-13 DIAGNOSIS — K50819 Crohn's disease of both small and large intestine with unspecified complications: Secondary | ICD-10-CM | POA: Diagnosis present

## 2020-04-13 DIAGNOSIS — E162 Hypoglycemia, unspecified: Secondary | ICD-10-CM | POA: Diagnosis not present

## 2020-04-13 DIAGNOSIS — K912 Postsurgical malabsorption, not elsewhere classified: Secondary | ICD-10-CM

## 2020-04-13 DIAGNOSIS — K859 Acute pancreatitis without necrosis or infection, unspecified: Secondary | ICD-10-CM

## 2020-04-13 DIAGNOSIS — N1832 Chronic kidney disease, stage 3b: Secondary | ICD-10-CM | POA: Diagnosis present

## 2020-04-13 DIAGNOSIS — E538 Deficiency of other specified B group vitamins: Secondary | ICD-10-CM | POA: Diagnosis present

## 2020-04-13 DIAGNOSIS — Z8249 Family history of ischemic heart disease and other diseases of the circulatory system: Secondary | ICD-10-CM

## 2020-04-13 DIAGNOSIS — R112 Nausea with vomiting, unspecified: Secondary | ICD-10-CM | POA: Diagnosis present

## 2020-04-13 DIAGNOSIS — T80219D Unspecified infection due to central venous catheter, subsequent encounter: Secondary | ICD-10-CM | POA: Diagnosis not present

## 2020-04-13 DIAGNOSIS — I10 Essential (primary) hypertension: Secondary | ICD-10-CM | POA: Diagnosis present

## 2020-04-13 DIAGNOSIS — R600 Localized edema: Secondary | ICD-10-CM | POA: Diagnosis present

## 2020-04-13 DIAGNOSIS — Z885 Allergy status to narcotic agent status: Secondary | ICD-10-CM

## 2020-04-13 DIAGNOSIS — E872 Acidosis: Secondary | ICD-10-CM | POA: Diagnosis not present

## 2020-04-13 DIAGNOSIS — K8689 Other specified diseases of pancreas: Secondary | ICD-10-CM | POA: Diagnosis present

## 2020-04-13 DIAGNOSIS — I129 Hypertensive chronic kidney disease with stage 1 through stage 4 chronic kidney disease, or unspecified chronic kidney disease: Secondary | ICD-10-CM | POA: Diagnosis present

## 2020-04-13 DIAGNOSIS — E46 Unspecified protein-calorie malnutrition: Secondary | ICD-10-CM | POA: Diagnosis present

## 2020-04-13 HISTORY — DX: Acute pancreatitis without necrosis or infection, unspecified: K85.90

## 2020-04-13 LAB — COMPREHENSIVE METABOLIC PANEL
ALT: 27 U/L (ref 0–44)
AST: 18 U/L (ref 15–41)
Albumin: 3.4 g/dL — ABNORMAL LOW (ref 3.5–5.0)
Alkaline Phosphatase: 112 U/L (ref 38–126)
Anion gap: 11 (ref 5–15)
BUN: 53 mg/dL — ABNORMAL HIGH (ref 6–20)
CO2: 27 mmol/L (ref 22–32)
Calcium: 9.4 mg/dL (ref 8.9–10.3)
Chloride: 101 mmol/L (ref 98–111)
Creatinine, Ser: 1.74 mg/dL — ABNORMAL HIGH (ref 0.44–1.00)
GFR calc Af Amer: 37 mL/min — ABNORMAL LOW (ref >60)
GFR calc non Af Amer: 32 mL/min — ABNORMAL LOW (ref >60)
Glucose, Bld: 97 mg/dL (ref 70–99)
Potassium: 3.3 mmol/L — ABNORMAL LOW (ref 3.5–5.1)
Sodium: 139 mmol/L (ref 135–145)
Total Bilirubin: 0.7 mg/dL (ref 0.3–1.2)
Total Protein: 7 g/dL (ref 6.5–8.1)

## 2020-04-13 LAB — CBC
HCT: 26.8 % — ABNORMAL LOW (ref 36.0–46.0)
Hemoglobin: 8.8 g/dL — ABNORMAL LOW (ref 12.0–15.0)
MCH: 31 pg (ref 26.0–34.0)
MCHC: 32.8 g/dL (ref 30.0–36.0)
MCV: 94.4 fL (ref 80.0–100.0)
Platelets: 163 10*3/uL (ref 150–400)
RBC: 2.84 MIL/uL — ABNORMAL LOW (ref 3.87–5.11)
RDW: 14.1 % (ref 11.5–15.5)
WBC: 4.8 10*3/uL (ref 4.0–10.5)
nRBC: 0 % (ref 0.0–0.2)

## 2020-04-13 LAB — MAGNESIUM: Magnesium: 2.2 mg/dL (ref 1.7–2.4)

## 2020-04-13 LAB — LIPASE, BLOOD: Lipase: 17 U/L (ref 11–51)

## 2020-04-13 MED ORDER — HYDROCORTISONE (PERIANAL) 2.5 % EX CREA
1.0000 "application " | TOPICAL_CREAM | Freq: Four times a day (QID) | CUTANEOUS | Status: DC | PRN
  Filled 2020-04-13: qty 28.35

## 2020-04-13 MED ORDER — ACETAMINOPHEN 650 MG RE SUPP: 650.0000 mg | Freq: Four times a day (QID) | RECTAL | Status: DC | PRN

## 2020-04-13 MED ORDER — TRAZODONE HCL 100 MG PO TABS: 200.0000 mg | ORAL_TABLET | Freq: Every evening | ORAL | Status: DC | PRN

## 2020-04-13 MED ORDER — VITAMIN B-12 1000 MCG PO TABS
1000.0000 ug | ORAL_TABLET | Freq: Every day | ORAL | Status: DC
  Filled 2020-04-13: qty 1

## 2020-04-13 MED ORDER — DULOXETINE HCL 60 MG PO CPEP: 60.0000 mg | ORAL_CAPSULE | Freq: Every day | ORAL | Status: DC

## 2020-04-13 MED ORDER — IOHEXOL 9 MG/ML PO SOLN
ORAL | Status: AC
  Filled 2020-04-13: qty 1000

## 2020-04-13 MED ORDER — MUSCLE RUB 10-15 % EX CREA
1.0000 "application " | TOPICAL_CREAM | CUTANEOUS | Status: DC | PRN
  Filled 2020-04-13: qty 85

## 2020-04-13 MED ORDER — DULOXETINE HCL 60 MG PO CPEP
90.0000 mg | ORAL_CAPSULE | Freq: Every day | ORAL | Status: DC
  Filled 2020-04-13: qty 1

## 2020-04-13 MED ORDER — FAMOTIDINE 20 MG PO TABS
20.0000 mg | ORAL_TABLET | Freq: Every day | ORAL | Status: DC
  Filled 2020-04-13 (×3): qty 1

## 2020-04-13 MED ORDER — SUCRALFATE 1 GM/10ML PO SUSP
1.0000 g | Freq: Three times a day (TID) | ORAL | Status: DC
  Administered 2020-04-16: 1 g via ORAL
  Filled 2020-04-13 (×6): qty 10

## 2020-04-13 MED ORDER — BUPROPION HCL ER (SR) 100 MG PO TB12
200.0000 mg | ORAL_TABLET | Freq: Every day | ORAL | Status: DC
  Filled 2020-04-13 (×5): qty 2

## 2020-04-13 MED ORDER — GUAIFENESIN-DM 100-10 MG/5ML PO SYRP: 5.0000 mL | ORAL_SOLUTION | ORAL | Status: DC | PRN

## 2020-04-13 MED ORDER — LORATADINE 10 MG PO TABS: 10.0000 mg | ORAL_TABLET | Freq: Every day | ORAL | Status: DC | PRN

## 2020-04-13 MED ORDER — HYDRALAZINE HCL 20 MG/ML IJ SOLN: 10.0000 mg | INTRAMUSCULAR | Status: DC | PRN

## 2020-04-13 MED ORDER — ACETAMINOPHEN 325 MG PO TABS
650.0000 mg | ORAL_TABLET | Freq: Four times a day (QID) | ORAL | Status: DC | PRN
  Administered 2020-04-13: 650 mg via ORAL
  Filled 2020-04-13: qty 2

## 2020-04-13 MED ORDER — ONDANSETRON HCL 4 MG/2ML IJ SOLN
4.0000 mg | Freq: Four times a day (QID) | INTRAMUSCULAR | Status: DC | PRN
  Administered 2020-04-13 – 2020-04-18 (×2): 4 mg via INTRAVENOUS
  Filled 2020-04-13 (×2): qty 2

## 2020-04-13 MED ORDER — ONDANSETRON HCL 4 MG PO TABS: 4.0000 mg | ORAL_TABLET | Freq: Four times a day (QID) | ORAL | Status: DC | PRN

## 2020-04-13 MED ORDER — BUPROPION HCL ER (SR) 100 MG PO TB12: 200.0000 mg | ORAL_TABLET | Freq: Every day | ORAL | Status: DC

## 2020-04-13 MED ORDER — POLYETHYLENE GLYCOL 3350 17 G PO PACK: 17.0000 g | PACK | Freq: Every day | ORAL | Status: DC | PRN

## 2020-04-13 MED ORDER — LACTATED RINGERS IV SOLN: INTRAVENOUS | Status: AC

## 2020-04-13 MED ORDER — AMLODIPINE BESYLATE 5 MG PO TABS
5.0000 mg | ORAL_TABLET | Freq: Every day | ORAL | Status: DC
  Administered 2020-04-14 – 2020-04-18 (×5): 5 mg via ORAL
  Filled 2020-04-13 (×5): qty 1

## 2020-04-13 MED ORDER — SODIUM CHLORIDE (PF) 0.9 % IJ SOLN
INTRAMUSCULAR | Status: AC
  Filled 2020-04-13: qty 50

## 2020-04-13 MED ORDER — SENNOSIDES-DOCUSATE SODIUM 8.6-50 MG PO TABS: 2.0000 | ORAL_TABLET | Freq: Every evening | ORAL | Status: DC | PRN

## 2020-04-13 MED ORDER — CYCLOSPORINE 0.05 % OP EMUL
1.0000 [drp] | Freq: Two times a day (BID) | OPHTHALMIC | Status: DC
  Administered 2020-04-13 – 2020-04-18 (×10): 1 [drp] via OPHTHALMIC
  Filled 2020-04-13 (×11): qty 1

## 2020-04-13 MED ORDER — PHENOL 1.4 % MT LIQD
1.0000 | OROMUCOSAL | Status: DC | PRN
  Filled 2020-04-13: qty 177

## 2020-04-13 MED ORDER — HYDROMORPHONE HCL 2 MG PO TABS: 4.0000 mg | ORAL_TABLET | Freq: Four times a day (QID) | ORAL | Status: DC | PRN

## 2020-04-13 MED ORDER — ADULT MULTIVITAMIN W/MINERALS CH
ORAL_TABLET | Freq: Every day | ORAL | Status: DC
  Filled 2020-04-13: qty 1

## 2020-04-13 MED ORDER — VITAMIN D 25 MCG (1000 UNIT) PO TABS
2000.0000 [IU] | ORAL_TABLET | Freq: Every day | ORAL | Status: DC
  Filled 2020-04-13: qty 2

## 2020-04-13 MED ORDER — HYDROCORTISONE 1 % EX CREA
1.0000 "application " | TOPICAL_CREAM | Freq: Three times a day (TID) | CUTANEOUS | Status: DC | PRN
  Filled 2020-04-13: qty 28

## 2020-04-13 MED ORDER — HYDROMORPHONE HCL 1 MG/ML IJ SOLN
1.0000 mg | INTRAMUSCULAR | Status: DC | PRN
  Administered 2020-04-13 – 2020-04-15 (×12): 1 mg via INTRAVENOUS
  Filled 2020-04-13 (×12): qty 1

## 2020-04-13 MED ORDER — SODIUM CHLORIDE 0.9% FLUSH
10.0000 mL | INTRAVENOUS | Status: DC | PRN
  Administered 2020-04-14 – 2020-04-16 (×3): 10 mL

## 2020-04-13 MED ORDER — LIP MEDEX EX OINT: 1.0000 "application " | TOPICAL_OINTMENT | CUTANEOUS | Status: DC | PRN

## 2020-04-13 MED ORDER — OMEGA-3-ACID ETHYL ESTERS 1 G PO CAPS
1000.0000 mg | ORAL_CAPSULE | Freq: Every day | ORAL | Status: DC
  Filled 2020-04-13: qty 1

## 2020-04-13 MED ORDER — SALINE SPRAY 0.65 % NA SOLN
1.0000 | NASAL | Status: DC | PRN
  Filled 2020-04-13: qty 44

## 2020-04-13 MED ORDER — OXYCODONE HCL 5 MG PO TABS
5.0000 mg | ORAL_TABLET | ORAL | Status: DC | PRN
  Administered 2020-04-16 – 2020-04-18 (×2): 5 mg via ORAL
  Filled 2020-04-13 (×3): qty 1

## 2020-04-13 MED ORDER — PANCRELIPASE (LIP-PROT-AMYL) 12000-38000 UNITS PO CPEP: 36000.0000 [IU] | ORAL_CAPSULE | Freq: Three times a day (TID) | ORAL | Status: DC

## 2020-04-13 MED ORDER — POLYVINYL ALCOHOL 1.4 % OP SOLN
1.0000 [drp] | Freq: Four times a day (QID) | OPHTHALMIC | Status: DC
  Administered 2020-04-13 – 2020-04-18 (×17): 1 [drp] via OPHTHALMIC
  Filled 2020-04-13: qty 15

## 2020-04-13 MED ORDER — BUDESONIDE 3 MG PO CPEP
9.0000 mg | ORAL_CAPSULE | Freq: Every day | ORAL | Status: DC
  Filled 2020-04-13: qty 3

## 2020-04-13 MED ORDER — POTASSIUM CHLORIDE 10 MEQ/100ML IV SOLN
10.0000 meq | INTRAVENOUS | Status: AC
  Administered 2020-04-13 – 2020-04-14 (×4): 10 meq via INTRAVENOUS
  Filled 2020-04-13 (×4): qty 100

## 2020-04-13 MED ORDER — PROMETHAZINE HCL 25 MG PO TABS
25.0000 mg | ORAL_TABLET | Freq: Four times a day (QID) | ORAL | Status: DC | PRN
  Administered 2020-04-14: 25 mg via ORAL
  Filled 2020-04-13 (×2): qty 1

## 2020-04-13 MED ORDER — PANTOPRAZOLE SODIUM 40 MG PO TBEC
40.0000 mg | DELAYED_RELEASE_TABLET | Freq: Every day | ORAL | Status: DC
  Filled 2020-04-13: qty 1

## 2020-04-13 MED ORDER — HEPARIN SODIUM (PORCINE) 5000 UNIT/ML IJ SOLN
5000.0000 [IU] | Freq: Three times a day (TID) | INTRAMUSCULAR | Status: DC
  Administered 2020-04-13 – 2020-04-18 (×15): 5000 [IU] via SUBCUTANEOUS
  Filled 2020-04-13 (×16): qty 1

## 2020-04-13 MED ORDER — METOPROLOL TARTRATE 50 MG PO TABS
100.0000 mg | ORAL_TABLET | Freq: Two times a day (BID) | ORAL | Status: DC
  Administered 2020-04-13 – 2020-04-18 (×10): 100 mg via ORAL
  Filled 2020-04-13 (×10): qty 2

## 2020-04-13 MED ORDER — SODIUM CHLORIDE 0.9% FLUSH
10.0000 mL | Freq: Two times a day (BID) | INTRAVENOUS | Status: DC
  Administered 2020-04-15 – 2020-04-17 (×3): 10 mL

## 2020-04-13 MED ORDER — METHADONE HCL 5 MG PO TABS
5.0000 mg | ORAL_TABLET | Freq: Every day | ORAL | Status: DC
  Administered 2020-04-14 – 2020-04-18 (×22): 5 mg via ORAL
  Filled 2020-04-13 (×23): qty 1

## 2020-04-13 MED ORDER — CHLORHEXIDINE GLUCONATE CLOTH 2 % EX PADS
6.0000 | MEDICATED_PAD | Freq: Every day | CUTANEOUS | Status: DC
  Administered 2020-04-13 – 2020-04-18 (×6): 6 via TOPICAL

## 2020-04-13 MED ORDER — POLYVINYL ALCOHOL 1.4 % OP SOLN
1.0000 [drp] | OPHTHALMIC | Status: DC | PRN
  Filled 2020-04-13: qty 15

## 2020-04-13 MED ORDER — ALUM & MAG HYDROXIDE-SIMETH 200-200-20 MG/5ML PO SUSP: 30.0000 mL | ORAL | Status: DC | PRN

## 2020-04-13 NOTE — Telephone Encounter (Signed)
Per MD called [facility] admissions to get patient admitted for abdominal pain/ pancreatitis. Admissions will contact patient once bed is available. Patient will go home and wait for bed.  Understands she must answer call. Patient will have phone on her all day. If patient is not available for any reason okay to call Darleene Cleaver (daughter) P: [phone]. [place]

## 2020-04-13 NOTE — Progress Notes (Signed)
Patient started to take contrast.

## 2020-04-13 NOTE — Progress Notes (Signed)
Subjective:  Chief complaint: Severe abdominal pain largely in her epigastric and left upper quadrant    Patient ID: Caitlin Peters, female    DOB: 11-Oct-1961, 59 y.o.   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  She currently has a femoral IV access PICC line that was exchanged during her most recent hospitalization when we saw her in February.  She was admitted in February with Enterobacter cloacae bacteremia she was treated with Zosyn then changed to ertapenem for ease of administration at home.  Bacteremia cleared and radiology were able to exchange a new line over her old IV access site.  Since I last saw her she unfortunately developed a stenotrophomonas bacteremia associated with her line.  There went PICC line exchange on 14 April.    She is initially treated with Bactrim which was complicated by acute on chronic elevation in her serum creatinine.  We changed her over to IV levofloxacin that she completed.  She returns to clinic for routine follow-up.  However the last 3 or 4 days she has had excruciating pain that has been not relieved by her not eating at all.  Has been doing her TPN at home and been taking extra opiate doses to try to manage her pain but today is having progressively worse pain.  It is painful her to make any bowel movements and certainly even walking causes pain in her epigastrium and left upper quadrant  Past Medical History:  Diagnosis Date  . Anasarca 10/2019  . AVN (avascular necrosis of bone) ([place])   . Cataract   . Chronic pain syndrome   . CKD (chronic kidney disease), stage III   . Crohn disease ([place])   . Crohn disease ([place])   . Depression   .  Diverticulosis   . GERD (gastroesophageal reflux disease)   . HTN (hypertension)   . IDA (iron deficiency anemia)   . Malnutrition ([place])   . Mass in chest   . Osteoporosis   . Pancreatitis   . Short gut syndrome   . Vitamin B12 deficiency     Past Surgical History:  Procedure Laterality Date  . ABDOMINAL HYSTERECTOMY    . ABDOMINAL SURGERY     colon resection with abdominal stoma, repair of intestinal leak, reversal of abdominal stoma  . BILIARY DILATION  11/26/2019   Procedure: BILIARY DILATION;  Surgeon: Jackquline Denmark, MD;  Location: [facility];  Service: Endoscopy;;  . BILIARY DILATION  03/08/2020   Procedure: BILIARY DILATION;  Surgeon: Irving Copas., MD;  Location: [place];  Service: Gastroenterology;;  . BIOPSY  03/08/2020   Procedure: BIOPSY;  Surgeon: Irving Copas., MD;  Location: [place];  Service: Gastroenterology;;  . CHEST WALL RESECTION     right thoracotomy,resection of chest mass with anterior rib and reconstruction using prosthetic mesh and video arthroscopy  . CHOLECYSTECTOMY    . COLONOSCOPY  2019  . ERCP N/A 11/26/2019   Procedure: ENDOSCOPIC RETROGRADE CHOLANGIOPANCREATOGRAPHY (ERCP);  Surgeon: Jackquline Denmark, MD;  Location: [facility];  Service: Endoscopy;  Laterality: N/A;  . ERCP N/A 03/08/2020   Procedure: ENDOSCOPIC RETROGRADE CHOLANGIOPANCREATOGRAPHY (ERCP);  Surgeon: Rush Landmark Telford Nab., MD;  Location: [place];  Service:  Gastroenterology;  Laterality: N/A;  . ESOPHAGOGASTRODUODENOSCOPY N/A 03/08/2020   Procedure: ESOPHAGOGASTRODUODENOSCOPY (EGD);  Surgeon: Irving Copas., MD;  Location: [place];  Service: Gastroenterology;  Laterality: N/A;  . EUS N/A 03/08/2020   Procedure: UPPER ENDOSCOPIC ULTRASOUND (EUS) LINEAR;  Surgeon: Irving Copas., MD;  Location: [place];  Service: Gastroenterology;  Laterality: N/A;  . IR FLUORO GUIDE CV LINE LEFT  01/07/2020  . IR FLUORO GUIDE CV LINE LEFT   03/09/2020  . IR PTA VENOUS EXCEPT DIALYSIS CIRCUIT  01/07/2020  . IR US GUIDE VASC ACCESS LEFT     x 2 06/17/19 and 09/14/2019  . KNEE SURGERY     right knee   . REMOVAL OF STONES  11/26/2019   Procedure: REMOVAL OF STONES;  Surgeon: Jackquline Denmark, MD;  Location: [facility];  Service: Endoscopy;;  . REMOVAL OF STONES  03/08/2020   Procedure: REMOVAL OF STONES;  Surgeon: Irving Copas., MD;  Location: [place];  Service: Gastroenterology;;  . SMALL INTESTINE SURGERY     x3  . SPHINCTEROTOMY  11/26/2019   Procedure: SPHINCTEROTOMY;  Surgeon: Jackquline Denmark, MD;  Location: [facility];  Service: Endoscopy;;  . UPPER GASTROINTESTINAL ENDOSCOPY      Family History  Problem Relation Age of Onset  . Breast cancer Sister   . Multiple sclerosis Sister   . Diabetes Sister   . Lupus Sister   . Colon cancer Other   . Crohn's disease Other   . Seizures Mother   . Glaucoma Mother   . CAD Father   . Heart disease Father   . Hypertension Father       Social History   Socioeconomic History  . Marital status: Single    Spouse name: Not on file  . Number of children: Not on file  . Years of education: Not on file  . Highest education level: Not on file  Occupational History  . Occupation: disabled  Tobacco Use  . Smoking status: Former Research scientist (life sciences)  . Smokeless tobacco: Never Used  Substance and Sexual Activity  . Alcohol use: Not Currently  . Drug use: Never  . Sexual activity: Not on file  Other Topics Concern  . Not on file  Social History Narrative  . Not on file   Social Determinants of Health   Financial Resource Strain:   . Difficulty of Paying Living Expenses:   Food Insecurity:   . Worried About Charity fundraiser in the Last Year:   . Arboriculturist in the Last Year:   Transportation Needs:   . Film/video editor (Medical):   [name] Kitchen Lack of Transportation (Non-Medical):   Physical Activity:   . Days of Exercise per Week:   . Minutes of Exercise per  Session:   Stress:   . Feeling of Stress :   Social Connections:   . Frequency of Communication with Friends and Family:   . Frequency of Social Gatherings with Friends and Family:   . Attends Religious Services:   . Active Member of Clubs or Organizations:   . Attends Archivist Meetings:   [name] Kitchen Marital Status:     Allergies  Allergen Reactions  . Cefepime Other (See Comments)    Neurotoxicity occurring in setting of AKI. Ceftriaxone tolerated during same admit  . Gabapentin Other (See Comments)    unknown  . Hyoscyamine Hives and Swelling    Legs swelling   Disorientation  . Lyrica [Pregabalin] Other (See Comments)  unknown  . Meperidine Hives    Other reaction(s): GI Upset Due to [place]   . Topamax [Topiramate] Other (See Comments)    unknown  . Fentanyl Rash    Pt is allergic to fentanyl patch related to the glue (gives her a rash) Pt states she is NOT allergic to fentanyl IV medicine  . Morphine And Related Rash     Current Outpatient Medications:  .  acetaminophen (TYLENOL) 325 MG tablet, Take 650 mg by mouth every 6 (six) hours as needed for mild pain. , Disp: , Rfl:  .  amLODipine (NORVASC) 5 MG tablet, Take 1 tablet (5 mg total) by mouth daily., Disp: 90 tablet, Rfl: 1 .  calcium carbonate (OS-CAL) 1250 (500 Ca) MG chewable tablet, Chew 1 tablet by mouth 2 (two) times daily., Disp: , Rfl:  .  cholecalciferol (VITAMIN D3) 25 MCG (1000 UT) tablet, Take 2,000 Units by mouth daily., Disp: , Rfl:  .  cycloSPORINE (RESTASIS) 0.05 % ophthalmic emulsion, Place 1 drop into both eyes 2 (two) times daily., Disp: , Rfl:  .  dexlansoprazole (DEXILANT) 60 MG capsule, Take 1 capsule (60 mg total) by mouth daily., Disp: 90 capsule, Rfl: 1 .  Dextran 70-Hypromellose 0.1-0.3 % SOLN, Place 1 drop into both eyes 4 (four) times daily., Disp: , Rfl:  .  dicyclomine (BENTYL) 20 MG tablet, Take 20 mg by mouth 3 (three) times daily., Disp: , Rfl:  .  diphenoxylate-atropine  (LOMOTIL) 2.5-0.025 MG tablet, Take 1 tablet by mouth 4 (four) times daily as needed for diarrhea or loose stools., Disp: 30 tablet, Rfl: 0 .  DULoxetine (CYMBALTA) 30 MG capsule, Take 3 capsules (90 mg total) by mouth daily., Disp: , Rfl: 3 .  estradiol (ESTRACE) 2 MG tablet, Take 1 tablet (2 mg total) by mouth daily., Disp: 90 tablet, Rfl: 1 .  famotidine (PEPCID) 20 MG tablet, Take 20 mg by mouth at bedtime., Disp: , Rfl:  .  HYDROmorphone (DILAUDID) 4 MG tablet, Take 1 tablet (4 mg total) by mouth every 6 (six) hours as needed for severe pain., Disp: 10 tablet, Rfl: 0 .  loperamide (IMODIUM) 2 MG capsule, TAKE 1 CAPSULE BY MOUTH   AS NEEDED FOR DIARRHEA OR  LOOSE  STOOLS, Disp: 90 capsule, Rfl: 0 .  methadone (DOLOPHINE) 10 MG tablet, Take 1 tablet (10 mg total) by mouth every 8 (eight) hours. (Patient taking differently: Take 5 mg by mouth See admin instructions. 5 times daily), Disp: , Rfl:  .  metoprolol tartrate (LOPRESSOR) 100 MG tablet, Take 1 tablet (100 mg total) by mouth 2 (two) times daily., Disp: 90 tablet, Rfl: 1 .  Multiple Vitamins-Minerals (MULTIVITAMIN ADULT PO), Take 1 tablet by mouth daily., Disp: , Rfl:  .  Omega-3 1000 MG CAPS, Take 1 capsule by mouth daily., Disp: , Rfl:  .  potassium chloride SA (KLOR-CON) 20 MEQ tablet, Take 40 mEq by mouth daily., Disp: , Rfl:  .  PRESCRIPTION MEDICATION, Home TPN [employer] in [facility], Disp: , Rfl:  .  Probiotic Product (PROBIOTIC-10 PO), Take 1 capsule by mouth daily., Disp: , Rfl:  .  promethazine (PHENERGAN) 25 MG tablet, TAKE 1 TABLET BY MOUTH EVERY 6 HOURS AS NEEDED FOR NAUSEA AND VOMITING, Disp: 90 tablet, Rfl: 0 .  sucralfate (CARAFATE) 1 GM/10ML suspension, Take 10 mLs (1 g total) by mouth 4 (four) times daily -  with meals and at bedtime., Disp: 420 mL, Rfl: 2 .  traZODone (DESYREL) 100 MG  tablet, Take 2 tablets (200 mg total) by mouth at bedtime as needed for sleep., Disp: 60 tablet, Rfl: 0 .  vitamin B-12  (CYANOCOBALAMIN) 100 MCG tablet, Take 1,000 mcg by mouth daily. , Disp: , Rfl:  .  budesonide (ENTOCORT EC) 3 MG 24 hr capsule, TAKE 3 CAPSULES BY MOUTH ONCE DAILY, Disp: 90 capsule, Rfl: 0 .  buPROPion (WELLBUTRIN SR) 100 MG 12 hr tablet, Take 2 tablets (200 mg total) by mouth daily., Disp: 60 tablet, Rfl: 2   Review of Systems  Constitutional: Negative for activity change, appetite change, chills, diaphoresis, fatigue, fever and unexpected weight change.  HENT: Negative for congestion, rhinorrhea, sinus pressure, sneezing, sore throat and trouble swallowing.   Eyes: Negative for photophobia and visual disturbance.  Respiratory: Negative for cough, chest tightness, shortness of breath, wheezing and stridor.   Cardiovascular: Negative for chest pain, palpitations and leg swelling.  Gastrointestinal: Positive for abdominal distention and abdominal pain. Negative for anal bleeding, blood in stool, constipation, diarrhea, nausea and vomiting.  Genitourinary: Negative for difficulty urinating, dysuria, flank pain and hematuria.  Musculoskeletal: Negative for arthralgias, back pain, gait problem, joint swelling and myalgias.  Skin: Negative for color change, pallor, rash and wound.  Neurological: Negative for dizziness, tremors, weakness and light-headedness.  Hematological: Negative for adenopathy. Does not bruise/bleed easily.  Psychiatric/Behavioral: Negative for agitation, behavioral problems, confusion, decreased concentration, dysphoric mood and sleep disturbance.       Objective:   Physical Exam Constitutional:      General: She is not in acute distress.    Appearance: She is well-developed. She is not diaphoretic.  HENT:     Head: Normocephalic and atraumatic.     Mouth/Throat:     Pharynx: No oropharyngeal exudate.  Eyes:     General: No scleral icterus.    Conjunctiva/sclera: Conjunctivae normal.  Cardiovascular:     Rate and Rhythm: Normal rate and regular rhythm.  Pulmonary:      Effort: Pulmonary effort is normal. No respiratory distress.     Breath sounds: No wheezing.  Abdominal:     General: There is distension.     Tenderness: There is generalized abdominal tenderness and tenderness in the epigastric area and left upper quadrant. There is rebound.  Musculoskeletal:        General: No tenderness.     Cervical back: Normal range of motion and neck supple.  Skin:    General: Skin is warm and dry.     Coloration: Skin is not pale.     Findings: No erythema or rash.  Neurological:     Mental Status: She is alert and oriented to person, place, and time.     Motor: No abnormal muscle tone.     Coordination: Coordination normal.  Psychiatric:        Attention and Perception: Attention and perception normal.        Mood and Affect: Affect normal. Mood is anxious.        Speech: Speech normal.        Behavior: Behavior normal.        Thought Content: Thought content normal.        Cognition and Memory: Cognition and memory normal.        Judgment: Judgment normal.    PICC line entry site is clean dry and intact Apr 13, 2020           Assessment & Plan:    Severe abdominal pain: She has a history of  prior pancreatitis and believes she is having a flare of it now.  Do not see how she is going to possibly manage this at home as she has been trying to do by not eating and taking extra opiate doses.  I would like to have her admitted to the hospital for IV hydration pain control and as necessary imaging and consultation with gastroenterology.  History of recurrent PICC line infections: Her current PICC line is clean dry and intact and is not overtly infected.  She has no systemic symptoms and I do not see any reason to get blood cultures in her at this time.

## 2020-04-13 NOTE — Progress Notes (Signed)
PHARMACY - TOTAL PARENTERAL NUTRITION CONSULT NOTE   Indication: short gut syndrome, on TPN PTA  Patient Measurements:    Usual Weight: 67.1 kg 04/13/2020 Ht: 68 inches BMI: 22.5  Assessment: 59 yo with PMH Chohn's disease, short gut syndrome on TPN, HTN, chronic pain, depression, CKD3b, GERD. Admitted 5/19 PM for persistent N/V - acute pancreatitis.  Pharmacy consulted to continue TPN as PTA.   Glucose / Insulin: no hx DM Electrolytes:  Renal:  LFTs / TGs:  Prealbumin / albumin:  Intake / Output; MIVF:  GI Imaging: Surgeries / Procedures:   Central access:  TPN start date:   Nutritional Goals (per RD recommendation on ):  Current Nutrition: TPN  Home TPN per [employer] Previously: 2 Liter cycle over 16 hours protien 100 gm Dextrose 300 gm Lipids 40 gm  Plan:  TPN ordered past 12 noon cut off TPN to start 5/20 at 1800 RD consult and TPN labs ordered for 5/20  Caitlin Peters, Pharm.D 04/13/2020 6:56 PM

## 2020-04-13 NOTE — H&P (Addendum)
History and Physical    KEIRAN SIAS XBM:[id number] DOB: May 08, 1961 DOA: 04/13/2020  PCP: [name], [name], MD Patient coming from: Infectious disease clinic  Chief Complaint: Abdominal pain and nausea  HPI: Caitlin Peters is a 59 y.o. 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  Typically takes Imodium 4 times daily and has 4 bowel movements but despite of doing this during this time she was having about 10 watery bowel movements daily.  She also reports of some dysuria.  Denies any fevers chills and other complaints.  Patient has left groin PICC line in place for TPN, also supplements orally as tolerated.  She called her infectious disease provider Dr. Drucilla Schmidt today who advised her to come to the hospital for further evaluation.   Patient admitted in April and discharged on 4/15.  Diagnosed with sepsis with stenotrophomonas bacteremia, initially received broad-spectrum antibiotics then transition to IV Bactrim for total of 3 weeks until 03/29/2020.  Femoral PICC line was replaced on 4/14 by IR.  MRCP showed dilated biliary ducts therefore also had EGD/ERCP on 4/13 which was relatively unremarkable.  Patient has had extensive history of Crohn's disease with short gut syndrome, used to follow-up [facility] clinic now at [facility] with gastroenterology.  Currently only on budesonide.  Review of Systems: As per HPI otherwise 10 point review of systems negative.  Review of Systems Otherwise negative except as per HPI, including: General: Denies fever, chills, night sweats or unintended weight loss. Resp: Denies cough, wheezing, shortness of breath. Cardiac: Denies chest pain, palpitations, orthopnea, paroxysmal  nocturnal dyspnea. GI: Denies constipation GU: Denies dysuria, frequency, hesitancy or incontinence MS: Denies muscle aches, joint pain or swelling Neuro: Denies headache, neurologic deficits (focal weakness, numbness, tingling), abnormal gait Psych: Denies anxiety, depression, SI/HI/AVH Skin: Denies new rashes or lesions ID: Denies sick contacts, exotic exposures, travel  Past Medical History:  Diagnosis Date  . Acute pancreatitis 04/13/2020  . Anasarca 10/2019  . AVN (avascular necrosis of bone) ([place])   . Cataract   . Chronic pain syndrome   . CKD (chronic kidney disease), stage III   . Crohn disease ([place])   . Crohn disease ([place])   . Depression   . Diverticulosis   . GERD (gastroesophageal reflux disease)   . HTN (hypertension)   . IDA (iron deficiency anemia)   . Malnutrition ([place])   . Mass in chest   . Osteoporosis   . Pancreatitis   . Short gut syndrome   . Vitamin B12 deficiency     Past Surgical History:  Procedure Laterality Date  . ABDOMINAL HYSTERECTOMY    . ABDOMINAL SURGERY     colon resection with abdominal stoma, repair of intestinal leak, reversal of abdominal stoma  . BILIARY DILATION  11/26/2019   Procedure: BILIARY DILATION;  Surgeon: Jackquline Denmark, MD;  Location: [facility];  Service: Endoscopy;;  . BILIARY DILATION  03/08/2020   Procedure: BILIARY DILATION;  Surgeon: Irving Copas., MD;  Location: [place];  Service: Gastroenterology;;  . BIOPSY  03/08/2020   Procedure: BIOPSY;  Surgeon: Irving Copas., MD;  Location: [place];  Service: Gastroenterology;;  . CHEST WALL RESECTION     right thoracotomy,resection of chest mass with anterior rib and reconstruction using prosthetic mesh and video arthroscopy  .  CHOLECYSTECTOMY    . COLONOSCOPY  2019  . ERCP N/A 11/26/2019   Procedure: ENDOSCOPIC RETROGRADE CHOLANGIOPANCREATOGRAPHY (ERCP);  Surgeon: Jackquline Denmark, MD;  Location: [facility];  Service: Endoscopy;  Laterality:  N/A;  . ERCP N/A 03/08/2020   Procedure: ENDOSCOPIC RETROGRADE CHOLANGIOPANCREATOGRAPHY (ERCP);  Surgeon: Irving Copas., MD;  Location: [place];  Service: Gastroenterology;  Laterality: N/A;  . ESOPHAGOGASTRODUODENOSCOPY N/A 03/08/2020   Procedure: ESOPHAGOGASTRODUODENOSCOPY (EGD);  Surgeon: Irving Copas., MD;  Location: [place];  Service: Gastroenterology;  Laterality: N/A;  . EUS N/A 03/08/2020   Procedure: UPPER ENDOSCOPIC ULTRASOUND (EUS) LINEAR;  Surgeon: Irving Copas., MD;  Location: [place];  Service: Gastroenterology;  Laterality: N/A;  . IR FLUORO GUIDE CV LINE LEFT  01/07/2020  . IR FLUORO GUIDE CV LINE LEFT  03/09/2020  . IR PTA VENOUS EXCEPT DIALYSIS CIRCUIT  01/07/2020  . IR US GUIDE VASC ACCESS LEFT     x 2 06/17/19 and 09/14/2019  . KNEE SURGERY     right knee   . REMOVAL OF STONES  11/26/2019   Procedure: REMOVAL OF STONES;  Surgeon: Jackquline Denmark, MD;  Location: [facility];  Service: Endoscopy;;  . REMOVAL OF STONES  03/08/2020   Procedure: REMOVAL OF STONES;  Surgeon: Irving Copas., MD;  Location: [place];  Service: Gastroenterology;;  . SMALL INTESTINE SURGERY     x3  . SPHINCTEROTOMY  11/26/2019   Procedure: SPHINCTEROTOMY;  Surgeon: Jackquline Denmark, MD;  Location: [facility];  Service: Endoscopy;;  . UPPER GASTROINTESTINAL ENDOSCOPY      SOCIAL HISTORY:  reports that she has quit smoking. She has never used smokeless tobacco. She reports previous alcohol use. She reports that she does not use drugs.  Allergies  Allergen Reactions  . Cefepime Other (See Comments)    Neurotoxicity occurring in setting of AKI. Ceftriaxone tolerated during same admit  . Gabapentin Other (See Comments)    unknown  . Hyoscyamine Hives and Swelling    Legs swelling   Disorientation  . Lyrica [Pregabalin] Other (See Comments)    unknown  . Meperidine Hives    Other reaction(s): GI Upset Due to [place]   . Topamax  [Topiramate] Other (See Comments)    unknown  . Fentanyl Rash    Pt is allergic to fentanyl patch related to the glue (gives her a rash) Pt states she is NOT allergic to fentanyl IV medicine  . Morphine And Related Rash    FAMILY HISTORY: Family History  Problem Relation Age of Onset  . Breast cancer Sister   . Multiple sclerosis Sister   . Diabetes Sister   . Lupus Sister   . Colon cancer Other   . Crohn's disease Other   . Seizures Mother   . Glaucoma Mother   . CAD Father   . Heart disease Father   . Hypertension Father      Prior to Admission medications   Medication Sig Start Date End Date Taking? Authorizing Provider  acetaminophen (TYLENOL) 325 MG tablet Take 650 mg by mouth every 6 (six) hours as needed for mild pain.     [provider]  amLODipine (NORVASC) 5 MG tablet Take 1 tablet (5 mg total) by mouth daily. 10/16/19   [name], [name], MD  budesonide (ENTOCORT EC) 3 MG 24 hr capsule TAKE 3 CAPSULES BY MOUTH ONCE DAILY 04/05/20   [name], [name], MD  buPROPion [facility] SR) 100 MG 12 hr tablet Take 2 tablets (200  mg total) by mouth daily. 12/15/19 03/14/20  [name], [name], MD  calcium carbonate (OS-CAL) 1250 (500 Ca) MG chewable tablet Chew 1 tablet by mouth 2 (two) times daily.    [provider]  cholecalciferol (VITAMIN D3) 25 MCG (1000 UT) tablet Take 2,000 Units by mouth daily.    [provider]  cycloSPORINE (RESTASIS) 0.05 % ophthalmic emulsion Place 1 drop into both eyes 2 (two) times daily.    [provider]  dexlansoprazole (DEXILANT) 60 MG capsule Take 1 capsule (60 mg total) by mouth daily. 10/16/19   [name], [name], MD  Dextran 70-Hypromellose 0.1-0.3 % SOLN Place 1 drop into both eyes 4 (four) times daily.    [provider]  dicyclomine (BENTYL) 20 MG tablet Take 20 mg by mouth 3 (three) times daily. 10/26/19   [provider]  diphenoxylate-atropine  (LOMOTIL) 2.5-0.025 MG tablet Take 1 tablet by mouth 4 (four) times daily as needed for diarrhea or loose stools. 10/31/19   Dessa Phi, DO  DULoxetine (CYMBALTA) 30 MG capsule Take 3 capsules (90 mg total) by mouth daily. 11/01/19   Dessa Phi, DO  DULoxetine (CYMBALTA) 60 MG capsule Take 60 mg by mouth daily. 12/09/19   [provider]  ertapenem [facility]) 1 g injection  01/14/20   [provider]  estradiol (ESTRACE) 2 MG tablet Take 1 tablet (2 mg total) by mouth daily. 12/10/19   [name], [name], MD  famotidine (PEPCID) 20 MG tablet Take 20 mg by mouth at bedtime.    [provider]  HYDROmorphone (DILAUDID) 4 MG tablet Take 1 tablet (4 mg total) by mouth every 6 (six) hours as needed for severe pain. 12/06/19   Ripley Fraise, MD  levofloxacin [facility]) 500 MG/100ML SOLN  03/23/20   [provider]  loperamide (IMODIUM) 2 MG capsule TAKE 1 CAPSULE BY MOUTH   AS NEEDED FOR DIARRHEA OR  LOOSE  STOOLS 03/22/20   [name], [name], MD  methadone (DOLOPHINE) 10 MG tablet Take 1 tablet (10 mg total) by mouth every 8 (eight) hours. Patient taking differently: Take 5 mg by mouth See admin instructions. 5 times daily 01/11/20   Hongalgi, Lenis Dickinson, MD  methadone (DOLOPHINE) 5 MG tablet TAKE 1 TABLET BY MOUTH FIVE TIMES DAILY. MAY TAKE 1 TABLET SPACED AS WORKS BEST FOR PAIN CONTROL. DO NOT EXCEED 5 TABLETS A DAY. 03/29/20   [provider]  metoprolol tartrate (LOPRESSOR) 100 MG tablet Take 1 tablet (100 mg total) by mouth 2 (two) times daily. 03/03/20   [name], [name], MD  Multiple Vitamins-Minerals (MULTIVITAMIN ADULT PO) Take 1 tablet by mouth daily.    [provider]  Omega-3 1000 MG CAPS Take 1 capsule by mouth daily.    [provider]  potassium chloride SA (KLOR-CON) 20 MEQ tablet Take 40 mEq by mouth daily.    [provider]  PRESCRIPTION MEDICATION Home TPN [employer] in [facility]     [provider]  Probiotic Product (PROBIOTIC-10 PO) Take 1 capsule by mouth daily.    [provider]  promethazine (PHENERGAN) 25 MG tablet TAKE 1 TABLET BY MOUTH EVERY 6 HOURS AS NEEDED FOR NAUSEA AND VOMITING 04/12/20   [name], [name], MD  sucralfate (CARAFATE) 1 GM/10ML suspension Take 10 mLs (1 g total) by mouth 4 (four) times daily -  with meals and at bedtime. 12/15/19   [name], [name], MD  traZODone ([place])  100 MG tablet Take 2 tablets (200 mg total) by mouth at bedtime as needed for sleep. 12/31/19   [name], [name], MD  vitamin B-12 (CYANOCOBALAMIN) 100 MCG tablet Take 1,000 mcg by mouth daily.     [provider]    Physical Exam: Vitals:   04/13/20 1759  BP: (!) 148/68  Pulse: (!) 42  Resp: 16  Temp: 99.1 F (37.3 C)  TempSrc: Oral  SpO2: 100%      Constitutional: NAD, calm, comfortable Eyes: PERRL, lids and conjunctivae normal ENMT: Mucous membranes are moist. Posterior pharynx clear of any exudate or lesions.Normal dentition.  Neck: normal, supple, no masses, no thyromegaly Respiratory: clear to auscultation bilaterally, no wheezing, no crackles. Normal respiratory effort. No accessory muscle use.  Cardiovascular: Regular rate and rhythm, no murmurs / rubs / gallops. No extremity edema. 2+ pedal pulses. No carotid bruits.  Abdomen: Abdomen is nontender nondistended.  Hypoactive bowel sounds. Musculoskeletal: no clubbing / cyanosis. No joint deformity upper and lower extremities. Good ROM, no contractures. Normal muscle tone.  Skin: no rashes, lesions, ulcers. No induration Neurologic: CN 2-12 grossly intact. Sensation intact, DTR normal. Strength 5/5 in all 4.  Psychiatric: Normal judgment and insight. Alert and oriented x 3. Normal mood.   Left groin PICC line noted.  Labs on Admission: I have personally reviewed following labs and imaging studies  CBC: No results for input(s): WBC, NEUTROABS, HGB, HCT,  MCV, PLT in the last 168 hours. Basic Metabolic Panel: No results for input(s): NA, K, CL, CO2, GLUCOSE, BUN, CREATININE, CALCIUM, MG, PHOS in the last 168 hours. GFR: CrCl cannot be calculated (Patient's most recent lab result is older than the maximum 21 days allowed.). Liver Function Tests: No results for input(s): AST, ALT, ALKPHOS, BILITOT, PROT, ALBUMIN in the last 168 hours. No results for input(s): LIPASE, AMYLASE in the last 168 hours. No results for input(s): AMMONIA in the last 168 hours. Coagulation Profile: No results for input(s): INR, PROTIME in the last 168 hours. Cardiac Enzymes: No results for input(s): CKTOTAL, CKMB, CKMBINDEX, TROPONINI in the last 168 hours. BNP (last 3 results) No results for input(s): PROBNP in the last 8760 hours. HbA1C: No results for input(s): HGBA1C in the last 72 hours. CBG: No results for input(s): GLUCAP in the last 168 hours. Lipid Profile: No results for input(s): CHOL, HDL, LDLCALC, TRIG, CHOLHDL, LDLDIRECT in the last 72 hours. Thyroid Function Tests: No results for input(s): TSH, T4TOTAL, FREET4, T3FREE, THYROIDAB in the last 72 hours. Anemia Panel: No results for input(s): VITAMINB12, FOLATE, FERRITIN, TIBC, IRON, RETICCTPCT in the last 72 hours. Urine analysis:    Component Value Date/Time   COLORURINE YELLOW 03/05/2020 0753   APPEARANCEUR CLEAR 03/05/2020 0753   LABSPEC 1.024 03/05/2020 0753   PHURINE 7.0 03/05/2020 0753   GLUCOSEU NEGATIVE 03/05/2020 0753   HGBUR NEGATIVE 03/05/2020 0753   BILIRUBINUR NEGATIVE 03/05/2020 [place] 03/05/2020 0753   PROTEINUR 100 (A) 03/05/2020 0753   NITRITE NEGATIVE 03/05/2020 0753   LEUKOCYTESUR NEGATIVE 03/05/2020 0753   Sepsis Labs: !!!!!!!!!!!!!!!!!!!!!!!!!!!!!!!!!!!!!!!!!!!! @LABRCNTIP (procalcitonin:4,lacticidven:4) )No results found for this or any previous visit (from the past 240 hour(s)).   Radiological Exams on Admission: No results found.   All images  have been reviewed by me personally.  EKG: Independently reviewed.   Assessment/Plan Principal Problem:   Intractable nausea and vomiting Active Problems:   Bilateral lower extremity edema   Crohn's colitis ([place])   Hypertension   Crohn disease ([place])  Malnutrition ([place])   Short gut syndrome   HTN (hypertension)   Acute kidney injury superimposed on chronic kidney disease ([name])   Stage 3b chronic kidney disease    Intractable nausea and vomiting   History of short gut syndrome associated with Crohn's disease, multiple resection Moderate protein calorie malnutrition, TPN dependent since 2003 History of chronic pancreatitis Increase in frequency of diarrhea -Admit patient to the hospital. -CT abdomen pelvis with p.o. contrast. -N.p.o., slowly advance diet as tolerated.  Accu-Cheks every 6 hours while n.p.o. -Lactated ringer 100 cc/h, can switch over to D5 half-normal saline if necessary -GI consult in the morning-previously seen [facility] -GI panel, C. difficile ordered.  Typically 5 watery bowel movements daily, over the last 4 days she has had 10 daily.  Recent antibiotic use.  Addendum 10:30 PM-CT abdomen pelvis with p.o. contrast-negative for any acute intra-abdominal pathology.  Dysuria -Check UA.  Hypokalemia -Repletion ordered.  Essential hypertension -Norvasc, metoprolol  Chronic pain -Methadone 5 mg daily and Dilaudid.  History of depression -Trazodone, Wellbutrin 219m , Cymbalta 90 mg daily  CKD stage IIIb -Baseline creatinine 1.6.  GERD -PPI, Carafate  Pharmacy consulted for TPN.  If patient ends up staying, this will start tomorrow at 6 PM   DVT prophylaxis: Subcutaneous heparin Code Status: Full code Family Communication: None Consults called: None Admission status: MedSurg admission  Status is: Observation  The patient remains OBS appropriate and will d/c before 2 midnights.  Dispo: The patient is from: Home              Anticipated  d/c is to: Home              Anticipated d/c date is: 1 day              Patient currently is not medically stable to d/c.  Due to intractable nausea vomiting she is requiring IV fluids.  Further lab work has been ordered depending on this, we will also order CT of the abdomen pelvis.       Time Spent: 65 minutes.  >50% of the time was devoted to discussing the patients care, assessment, plan and disposition with other care givers along with counseling the patient about the risks and benefits of treatment.    Courtne Lighty CArsenio LoaderMD [facility]  If 7PM-7AM, please contact night-coverage   04/13/2020, 6:19 PM

## 2020-04-14 ENCOUNTER — Encounter (HOSPITAL_COMMUNITY): Payer: Self-pay | Admitting: Internal Medicine

## 2020-04-14 DIAGNOSIS — K50819 Crohn's disease of both small and large intestine with unspecified complications: Secondary | ICD-10-CM

## 2020-04-14 DIAGNOSIS — K5790 Diverticulosis of intestine, part unspecified, without perforation or abscess without bleeding: Secondary | ICD-10-CM | POA: Diagnosis present

## 2020-04-14 DIAGNOSIS — Z79890 Hormone replacement therapy (postmenopausal): Secondary | ICD-10-CM | POA: Diagnosis not present

## 2020-04-14 DIAGNOSIS — G894 Chronic pain syndrome: Secondary | ICD-10-CM | POA: Diagnosis present

## 2020-04-14 DIAGNOSIS — K8689 Other specified diseases of pancreas: Secondary | ICD-10-CM | POA: Diagnosis present

## 2020-04-14 DIAGNOSIS — Z87891 Personal history of nicotine dependence: Secondary | ICD-10-CM | POA: Diagnosis not present

## 2020-04-14 DIAGNOSIS — I129 Hypertensive chronic kidney disease with stage 1 through stage 4 chronic kidney disease, or unspecified chronic kidney disease: Secondary | ICD-10-CM | POA: Diagnosis present

## 2020-04-14 DIAGNOSIS — E876 Hypokalemia: Secondary | ICD-10-CM | POA: Diagnosis present

## 2020-04-14 DIAGNOSIS — E440 Moderate protein-calorie malnutrition: Secondary | ICD-10-CM | POA: Diagnosis present

## 2020-04-14 DIAGNOSIS — K219 Gastro-esophageal reflux disease without esophagitis: Secondary | ICD-10-CM | POA: Diagnosis present

## 2020-04-14 DIAGNOSIS — N1832 Chronic kidney disease, stage 3b: Secondary | ICD-10-CM | POA: Diagnosis present

## 2020-04-14 DIAGNOSIS — M545 Low back pain: Secondary | ICD-10-CM | POA: Diagnosis present

## 2020-04-14 DIAGNOSIS — E162 Hypoglycemia, unspecified: Secondary | ICD-10-CM | POA: Diagnosis not present

## 2020-04-14 DIAGNOSIS — F329 Major depressive disorder, single episode, unspecified: Secondary | ICD-10-CM | POA: Diagnosis present

## 2020-04-14 DIAGNOSIS — Z6822 Body mass index (BMI) 22.0-22.9, adult: Secondary | ICD-10-CM | POA: Diagnosis not present

## 2020-04-14 DIAGNOSIS — N179 Acute kidney failure, unspecified: Secondary | ICD-10-CM | POA: Diagnosis present

## 2020-04-14 DIAGNOSIS — M810 Age-related osteoporosis without current pathological fracture: Secondary | ICD-10-CM | POA: Diagnosis present

## 2020-04-14 DIAGNOSIS — Z79891 Long term (current) use of opiate analgesic: Secondary | ICD-10-CM | POA: Diagnosis not present

## 2020-04-14 DIAGNOSIS — E538 Deficiency of other specified B group vitamins: Secondary | ICD-10-CM | POA: Diagnosis present

## 2020-04-14 DIAGNOSIS — E872 Acidosis: Secondary | ICD-10-CM | POA: Diagnosis not present

## 2020-04-14 DIAGNOSIS — K912 Postsurgical malabsorption, not elsewhere classified: Secondary | ICD-10-CM | POA: Diagnosis present

## 2020-04-14 DIAGNOSIS — K5010 Crohn's disease of large intestine without complications: Secondary | ICD-10-CM | POA: Diagnosis present

## 2020-04-14 DIAGNOSIS — Z79899 Other long term (current) drug therapy: Secondary | ICD-10-CM | POA: Diagnosis not present

## 2020-04-14 DIAGNOSIS — D638 Anemia in other chronic diseases classified elsewhere: Secondary | ICD-10-CM | POA: Diagnosis present

## 2020-04-14 DIAGNOSIS — R300 Dysuria: Secondary | ICD-10-CM | POA: Diagnosis present

## 2020-04-14 DIAGNOSIS — R112 Nausea with vomiting, unspecified: Secondary | ICD-10-CM | POA: Diagnosis present

## 2020-04-14 LAB — COMPREHENSIVE METABOLIC PANEL
ALT: 31 U/L (ref 0–44)
AST: 28 U/L (ref 15–41)
Albumin: 3.1 g/dL — ABNORMAL LOW (ref 3.5–5.0)
Alkaline Phosphatase: 110 U/L (ref 38–126)
Anion gap: 8 (ref 5–15)
BUN: 41 mg/dL — ABNORMAL HIGH (ref 6–20)
CO2: 25 mmol/L (ref 22–32)
Calcium: 8.8 mg/dL — ABNORMAL LOW (ref 8.9–10.3)
Chloride: 105 mmol/L (ref 98–111)
Creatinine, Ser: 1.61 mg/dL — ABNORMAL HIGH (ref 0.44–1.00)
GFR calc Af Amer: 40 mL/min — ABNORMAL LOW (ref >60)
GFR calc non Af Amer: 35 mL/min — ABNORMAL LOW (ref >60)
Glucose, Bld: 83 mg/dL (ref 70–99)
Potassium: 3.7 mmol/L (ref 3.5–5.1)
Sodium: 138 mmol/L (ref 135–145)
Total Bilirubin: 0.8 mg/dL (ref 0.3–1.2)
Total Protein: 6.4 g/dL — ABNORMAL LOW (ref 6.5–8.1)

## 2020-04-14 LAB — GASTROINTESTINAL PANEL BY PCR, STOOL (REPLACES STOOL CULTURE)

## 2020-04-14 LAB — URINALYSIS, ROUTINE W REFLEX MICROSCOPIC
Bilirubin Urine: NEGATIVE
Glucose, UA: NEGATIVE mg/dL
Hgb urine dipstick: NEGATIVE
Ketones, ur: NEGATIVE mg/dL
Nitrite: NEGATIVE
Protein, ur: NEGATIVE mg/dL
Specific Gravity, Urine: 1.006 (ref 1.005–1.030)
pH: 6 (ref 5.0–8.0)

## 2020-04-14 LAB — CBC
HCT: 27 % — ABNORMAL LOW (ref 36.0–46.0)
Hemoglobin: 8.7 g/dL — ABNORMAL LOW (ref 12.0–15.0)
MCH: 30.9 pg (ref 26.0–34.0)
MCHC: 32.2 g/dL (ref 30.0–36.0)
MCV: 95.7 fL (ref 80.0–100.0)
Platelets: 157 10*3/uL (ref 150–400)
RBC: 2.82 MIL/uL — ABNORMAL LOW (ref 3.87–5.11)
RDW: 14 % (ref 11.5–15.5)
WBC: 3.6 10*3/uL — ABNORMAL LOW (ref 4.0–10.5)
nRBC: 0 % (ref 0.0–0.2)

## 2020-04-14 LAB — TRIGLYCERIDES: Triglycerides: 94 mg/dL (ref <150)

## 2020-04-14 LAB — DIFFERENTIAL
Abs Immature Granulocytes: 0.01 10*3/uL (ref 0.00–0.07)
Basophils Absolute: 0 10*3/uL (ref 0.0–0.1)
Basophils Relative: 0 %
Eosinophils Absolute: 0.1 10*3/uL (ref 0.0–0.5)
Eosinophils Relative: 4 %
Immature Granulocytes: 0 %
Lymphocytes Relative: 53 %
Lymphs Abs: 1.9 10*3/uL (ref 0.7–4.0)
Monocytes Absolute: 0.2 10*3/uL (ref 0.1–1.0)
Monocytes Relative: 6 %
Neutro Abs: 1.3 10*3/uL — ABNORMAL LOW (ref 1.7–7.7)
Neutrophils Relative %: 37 %

## 2020-04-14 LAB — C DIFFICILE QUICK SCREEN W PCR REFLEX
C Diff antigen: NEGATIVE
C Diff interpretation: NOT DETECTED
C Diff toxin: NEGATIVE

## 2020-04-14 LAB — PHOSPHORUS: Phosphorus: 3.5 mg/dL (ref 2.5–4.6)

## 2020-04-14 LAB — PREALBUMIN: Prealbumin: 26 mg/dL (ref 18–38)

## 2020-04-14 LAB — GLUCOSE, CAPILLARY
Glucose-Capillary: 133 mg/dL — ABNORMAL HIGH (ref 70–99)
Glucose-Capillary: 66 mg/dL — ABNORMAL LOW (ref 70–99)
Glucose-Capillary: 75 mg/dL (ref 70–99)
Glucose-Capillary: 81 mg/dL (ref 70–99)
Glucose-Capillary: 83 mg/dL (ref 70–99)

## 2020-04-14 LAB — MAGNESIUM: Magnesium: 2.1 mg/dL (ref 1.7–2.4)

## 2020-04-14 MED ORDER — PROMETHAZINE HCL 25 MG/ML IJ SOLN
6.2500 mg | Freq: Four times a day (QID) | INTRAMUSCULAR | Status: DC | PRN
  Administered 2020-04-16 – 2020-04-18 (×5): 6.25 mg via INTRAVENOUS
  Filled 2020-04-14 (×5): qty 1

## 2020-04-14 MED ORDER — DEXTROSE 50 % IV SOLN
INTRAVENOUS | Status: AC
  Filled 2020-04-14: qty 50

## 2020-04-14 MED ORDER — INSULIN ASPART 100 UNIT/ML ~~LOC~~ SOLN
0.0000 [IU] | SUBCUTANEOUS | Status: DC
  Administered 2020-04-16: 1 [IU] via SUBCUTANEOUS

## 2020-04-14 MED ORDER — DEXTROSE 50 % IV SOLN
12.5000 g | INTRAVENOUS | Status: AC
  Administered 2020-04-14: 12.5 g via INTRAVENOUS

## 2020-04-14 MED ORDER — DEXTROSE-NACL 5-0.9 % IV SOLN: INTRAVENOUS | Status: DC

## 2020-04-14 MED ORDER — TRAVASOL 10 % IV SOLN
INTRAVENOUS | Status: AC
  Filled 2020-04-14: qty 1000

## 2020-04-14 MED ORDER — LACTATED RINGERS IV SOLN: INTRAVENOUS | Status: DC

## 2020-04-14 MED ORDER — BUDESONIDE 3 MG PO CPEP
6.0000 mg | ORAL_CAPSULE | Freq: Every day | ORAL | Status: DC
  Filled 2020-04-14 (×4): qty 2

## 2020-04-14 NOTE — Progress Notes (Signed)
Dr Maren Beach aware that pt afraid of fluid over load with TNA running and IVFs. IVF rate decreased to 30/hr per MD order received.

## 2020-04-14 NOTE — Progress Notes (Signed)
PHARMACY - TOTAL PARENTERAL NUTRITION CONSULT NOTE   Indication: short gut syndrome, on TPN PTA  Patient Measurements:Height: 5' 8"  (172.7 cm) Weight: 67.1 kg (148 lb) IBW/kg (Calculated) : 63.9 TPN AdjBW (KG): 67.1 Usual Weight: 67.1 kg 04/13/2020 Ht: 68 inches BMI: 22.5  Assessment: 59 yo with PMH Chohn's disease, short gut syndrome on TPN, HTN, chronic pain, depression, CKD3b, GERD. Admitted 5/19 PM for persistent N/V - acute pancreatitis.  Pharmacy consulted to continue TPN as PTA.   Glucose / Insulin: no hx DM, glucose at goal <150 Electrolytes: WNL Renal: Scr 1.61 which appears to be her baseline LFTs / TGs: LFTs WNL, TG 94 on 5/20 Prealbumin / albumin: Prealbumin 26 on 5/20 Intake / Output; MIVF: LR @ 19m/hr; no output recorded GI Imaging: 5/19 CT abdomen: no acute findings Surgeries / Procedures:   Central access:  +PICC line TPN start date: Has been on TPN since 2003 for short gut syndrome  Nutritional Goals (per RD recommendation on 04/14/20): Kcal:  2000-2200 Protein:  100-115g Fluid:  2L/day  Current Nutrition: TPN  Home TPN per [employer] Previously: 2 Liter cycle over 16 hours protien 100 gm Dextrose 300 gm Lipids 40 gm  Plan:  Continue home cyclic TPN over 149S(from 6p-->10a daily) Electrolytes in TPN: 580m/L of Na, 5035mL of K, 3.5mE63m of Ca, 5mEq24mof Mg, and 10mmo52mof Phos. Cl:Ac ratio 1:1 Add trace elements to TPN.  Oral MVI daily.  Check CBGs and cover with sensitive SSI per cyclic TPN protocol (1h after TPN started, 2h after TPN stopped, once while TPN infusing & once while off TPN)  LR @ 100ml/h54mr 8h daily-->Hold LR while TPN infusing.   Monitor TPN labs on Mon/Thurs  MichellNetta CedarsD, BCPS 04/14/2020 11:46 AM

## 2020-04-14 NOTE — Progress Notes (Addendum)
PROGRESS NOTE    Caitlin Peters  WNI:[id number] DOB: [birth year] DOA: 04/13/2020 PCP: [name], [name], MD   Chef Complaints: diarrhea, generalized cramping abdominal pain and nausea x4 days.  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 Drucilla Schmidt.  Recent admission in April with sepsis with stenotrophomonas bacteremia transition to Bactrim x3 weeks until 04/06/2020, femoral PICC line replaced 4/14 by IR, MRCP showed dilated biliary duct therefore also had EGD/ERCP on 4/13, relatively unremarkable.  Subjective:  C/o pain in lower abdomen- worse before and during BM, no nausea or vomitting- took nausea meds which helped, prefers phenergan iv- as she takes phenergan at home. Pain in lower back on left side- chronic reports she is in hospital every month either with line infection or gut infections C  Diff and gip negative. She goes to [facility] afebrile overnight.  Labs pending. Just had Bm loose-greenish  Assessment & Plan:  Intractable nausea and vomiting and diarrhea in the setting of short gut syndrome with Crohn's disease: Seen in the ID clinic, CT abdomen no acute finding. GI panel.  C. difficile was negative. Consult GI, keep n.p.o., continue IV fluid hydration, symptomatic management.  Longstanding Crohn's disease status post multiple resections resulting into short gut syndrome: Symptomatic management as above.  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  Moderate protein calorie months and on TPN since 2003: Dietitian consult, along to augment nutrition.  Pharmacy consulted for  TPN.  PICC on the left femoral dressing changed yesterday appears clean dry no drainage  Dysuria: UA relatively stable with negative nitrite, trace leukocytes, WBC 0-5.  Hypokalemia-was repleted.  Repeat.  Essential hypertension: BP stable on amlodipine and metoprolol.  Chronic pain/depression on methadone 5 mg and Dilaudid.  On trazodone Wellbutrin and Cymbalta.  CKD stage IIIb baseline creatinine 1.6.  Stable.  At risk of renal failure continue hydration.  GERD continue PPI and Carafate.  DVT prophylaxisHeparin Code Status: FULL Family Communication: plan of care discussed with patient at bedside.  Status is: Observation The patient will require care spanning > 2 midnights and should be moved to inpatient because: Persistent severe electrolyte disturbances, IV treatments appropriate due to intensity of illness or inability to take PO, Inpatient level of care appropriate due to severity of illness and symptomatic with abdominal pain diarrhea Dispo: The patient is from: Home              Anticipated d/c is to: Home              Anticipated d/c date is: 2 days Nutrition: Diet Order            Diet NPO time specified  Diet effective now              Consultants:see note  Procedures:see note Microbiology:see note  Medications: Scheduled Meds: . amLODipine  5 mg Oral Daily  . budesonide  9 mg Oral Daily  . buPROPion  200 mg Oral Daily  . Chlorhexidine Gluconate Cloth  6 each Topical Daily  . cholecalciferol  2,000 Units Oral Daily  . cycloSPORINE  1 drop Both Eyes BID  .  DULoxetine  90 mg Oral Daily  . famotidine  20 mg Oral QHS  . heparin  5,000 Units Subcutaneous Q8H  . lipase/protease/amylase  36,000 Units Oral TID with meals  . methadone  5 mg Oral 5 X Daily  . metoprolol tartrate  100 mg Oral BID  . multivitamin with minerals   Oral Daily  . omega-3 acid ethyl esters  1,000 mg Oral Daily  . pantoprazole  40 mg Oral Daily  . polyvinyl alcohol  1 drop Both Eyes QID   . sodium chloride flush  10-40 mL Intracatheter Q12H  . sucralfate  1 g Oral TID WC & HS  . vitamin B-12  1,000 mcg Oral Daily   Continuous Infusions: . lactated ringers 100 mL/hr at 04/13/20 1937    Antimicrobials: Anti-infectives (From admission, onward)   None       Objective: Vitals: Today's Vitals   04/14/20 0342 04/14/20 0611 04/14/20 0617 04/14/20 0921  BP:  124/69  128/60  Pulse:  (!) 59  93  Resp:  18    Temp:  98.6 F (37 C)    TempSrc:  Oral    SpO2:  100%    Weight:      Height:      PainSc: Asleep  10-Worst pain ever     Intake/Output Summary (Last 24 hours) at 04/14/2020 1108 Last data filed at 04/14/2020 1038 Gross per 24 hour  Intake 786.69 ml  Output 0 ml  Net 786.69 ml   Filed Weights   04/13/20 2035  Weight: 67.1 kg   Weight change:    Intake/Output from previous day: 05/19 0701 - 05/20 0700 In: 776.7 [I.V.:471.8; IV Piggyback:304.9] Out: 0  Intake/Output this shift: Total I/O In: 10 [I.V.:10] Out: -   Examination:  General exam: AAOx3,thin, frail, on RA. HEENT:Oral mucosa moist, Ear/Nose WNL grossly,dentition normal. Respiratory system: bilaterally clear, on RA,no wheezing or crackles,no use of accessory muscle, non tender. Cardiovascular system: S1 & S2 +, regular, No JVD. Gastrointestinal system: Abdomen soft, Tender lower abdomen,ND, BS+. Nervous System:Alert, awake, moving extremities and grossly nonfocal Extremities: No edema, distal peripheral pulses palpable.  Skin: No rashes,no icterus. MSK: Normal muscle bulk,tone, power Left thigh with PICC line.  Data Reviewed: I have personally reviewed following labs and imaging studies CBC: Recent Labs  Lab 04/13/20 1920 04/14/20 1043  WBC 4.8 3.6*  NEUTROABS  --  1.3*  HGB 8.8* 8.7*  HCT 26.8* 27.0*  MCV 94.4 95.7  PLT 163 416   Basic Metabolic Panel: Recent Labs  Lab 04/13/20 1920  NA 139  K 3.3*  CL 101  CO2 27  GLUCOSE 97  BUN 53*  CREATININE 1.74*  CALCIUM  9.4  MG 2.2   GFR: Estimated Creatinine Clearance: 35.1 mL/min (A) (by C-G formula based on SCr of 1.74 mg/dL (H)). Liver Function Tests: Recent Labs  Lab 04/13/20 1920  AST 18  ALT 27  ALKPHOS 112  BILITOT 0.7  PROT 7.0  ALBUMIN 3.4*   Recent Labs  Lab 04/13/20 1920  LIPASE 17   No results for input(s): AMMONIA in the last 168 hours. Coagulation Profile: No results for input(s): INR, PROTIME in the last 168 hours. Cardiac Enzymes: No results for input(s): CKTOTAL, CKMB, CKMBINDEX, TROPONINI in the last 168 hours. BNP (last 3 results) No results for input(s): PROBNP in the last 8760 hours. HbA1C: No results for input(s): HGBA1C in the last 72 hours. CBG: Recent Labs  Lab 04/14/20 0042 04/14/20 0609  GLUCAP  75 81   Lipid Profile: No results for input(s): CHOL, HDL, LDLCALC, TRIG, CHOLHDL, LDLDIRECT in the last 72 hours. Thyroid Function Tests: No results for input(s): TSH, T4TOTAL, FREET4, T3FREE, THYROIDAB in the last 72 hours. Anemia Panel: No results for input(s): VITAMINB12, FOLATE, FERRITIN, TIBC, IRON, RETICCTPCT in the last 72 hours. Sepsis Labs: No results for input(s): PROCALCITON, LATICACIDVEN in the last 168 hours.  Recent Results (from the past 240 hour(s))  Gastrointestinal Panel by PCR , Stool     Status: None   Collection Time: 04/13/20  8:43 PM   Specimen: Rectum; Stool  Result Value Ref Range Status   Campylobacter species NOT DETECTED NOT DETECTED Final   Plesimonas shigelloides NOT DETECTED NOT DETECTED Final   Salmonella species NOT DETECTED NOT DETECTED Final   Yersinia enterocolitica NOT DETECTED NOT DETECTED Final   Vibrio species NOT DETECTED NOT DETECTED Final   Vibrio cholerae NOT DETECTED NOT DETECTED Final   Enteroaggregative E coli (EAEC) NOT DETECTED NOT DETECTED Final   Enteropathogenic E coli (EPEC) NOT DETECTED NOT DETECTED Final   Enterotoxigenic E coli (ETEC) NOT DETECTED NOT DETECTED Final   Shiga like toxin producing E  coli (STEC) NOT DETECTED NOT DETECTED Final   Shigella/Enteroinvasive E coli (EIEC) NOT DETECTED NOT DETECTED Final   Cryptosporidium NOT DETECTED NOT DETECTED Final   Cyclospora cayetanensis NOT DETECTED NOT DETECTED Final   Entamoeba histolytica NOT DETECTED NOT DETECTED Final   Giardia lamblia NOT DETECTED NOT DETECTED Final   Adenovirus F40/41 NOT DETECTED NOT DETECTED Final   Astrovirus NOT DETECTED NOT DETECTED Final   Norovirus GI/GII NOT DETECTED NOT DETECTED Final   Rotavirus A NOT DETECTED NOT DETECTED Final   Sapovirus (I, II, IV, and V) NOT DETECTED NOT DETECTED Final    Comment: Performed at [facility], [address] [place], [place] [address]  C Difficile Quick Screen w PCR reflex     Status: None   Collection Time: 04/14/20  2:57 AM   Specimen: STOOL  Result Value Ref Range Status   C Diff antigen NEGATIVE NEGATIVE Final   C Diff toxin NEGATIVE NEGATIVE Final   C Diff interpretation No C. difficile detected.  Final    Comment: Performed at [facility], [place] [address] [place], [place] [address]      Radiology Studies: CT ABDOMEN PELVIS WO CONTRAST  Result Date: 04/13/2020 CLINICAL DATA:  Nausea and vomiting. Patient has history of cholecystectomy, appendectomy, and previous colon surgery for Crohn's disease. EXAM: CT ABDOMEN AND PELVIS WITHOUT CONTRAST TECHNIQUE: Multidetector CT imaging of the abdomen and pelvis was performed following the standard protocol without IV contrast. COMPARISON:  CT 03/05/2020, MRCP 03/07/2020 FINDINGS: Lower chest: The lung bases are clear. Hepatobiliary: Minimal pneumobilia in the left lobe likely related to prior ERCP. No focal hepatic lesion. Prior cholecystectomy. No biliary dilatation on noncontrast exam. Pancreas: Proximal pancreatic ductal dilatation of 5 mm which appears similar to prior exam. There is suggestion of retained contrast in the pancreatic duct from prior ERCP. No peripancreatic fat stranding.  No peripancreatic fluid collection. Spleen: Normal in size without focal abnormality. Adrenals/Urinary Tract: No adrenal nodule. No hydronephrosis or perinephric edema. No renal stone. Urinary bladder is decompressed. Stomach/Bowel: Stomach is unremarkable. Duodenum approaches the midline with small-bowel than courses in the right abdomen, unchanged. There is no evidence of bowel obstruction or inflammation. Prior right hemicolectomy. Additional chain sutures within small bowel. Enteric contrast reaches the ileocolic junction. Mild gaseous distension of transverse,  descending, and to a lesser extent sigmoid colon is also seen on prior exam. Scattered liquid stool in the colon. No colonic wall thickening or inflammation. Vascular/Lymphatic: Left common femoral venous catheter tip is in the IVC. Mild aortic atherosclerosis. No bulky adenopathy, evaluation for enlarged lymph nodes is limited in the absence of IV contrast and paucity of intra-abdominal fat. Reproductive: Status post hysterectomy. No adnexal masses. Other: No ascites, free air, or significant inflammatory change. No body wall hernia. Musculoskeletal: There are no acute or suspicious osseous abnormalities. Chronic avascular necrosis of the left femoral head. Chronic bone island in the left pelvis. IMPRESSION: 1. No acute findings in the abdomen/pelvis. 2. Unchanged pancreatic ductal dilatation without evidence of pancreatitis/pancreatic inflammation. 3. No acute bowel inflammation. Mild chronic colonic distension, unchanged from prior exam. No acute inflammation. Aortic Atherosclerosis (ICD10-I70.0). Electronically Signed   By: Keith Rake M.D.   On: 04/13/2020 21:37     LOS: 0 days   Antonieta Pert, MD [facility]  04/14/2020, 11:08 AM

## 2020-04-14 NOTE — Consult Note (Addendum)
Referring Provider:  [facility]         Primary Care Physician:  Caitlin Peters, Caitlin Halsted, MD Primary Gastroenterologist:   Caitlin Park, MD           We were asked to see this patient for:    Nausea and vomiting            ASSESSMENT /  PLAN    59 yo female with pmh significant for Crohn's disease, short gut syndrome, CKD3, osteoporosis, HTN, B12 deficiency, anxiety, GERD, cholecystectomy, partial hysterectomy.   # Longstanding Crohn's disease, s/p multiple resections resulting in short gut syndrome.  --No active inflammation on non-contrast CT scan this admission nor on CT scan w/ contrast during admission last month.  --On chronic TNA. Recurrent line infections over the years  --Chronic pain managed with narcotics --Most recently treated with Entocort --Established care with Dr. Domenica Peters at [facility] on 04/01/20. Entocort being tapered with plans to discontinue. She is on 6 mg at home but 9 ordered inpatient so will decrease dose.  --Might be retrying Gattex in the future.  --Continue home anti-diarrheals. --Continue TNA  --Her pain seems controlled at the moment. She is mainly concerned about nausea.  --Hopefully discharge soon  # Nausea and vomiting --On phenergan 25 mg around the clock at home.  --She is not getting Phenergan in the hospital so this should be resumed.  --Says Zofran doesn't work for her.     # Chronic abdominal pain.  --No acute abnormalities on CT scan this admission.  -- Sees Dr. Royce Peters - pain management. Takes Methadone 5 mg five times daily and Dilaudid 4 mg QID --Asking for increase in pain medications. -   # Chronically dilated PD --Evaluated by 03/08/20 by EUS/ ERCP ( Dr Rush Landmark) --EUS : mild dilation in the common bile duct. No stones/sludge noted.The pancreatic duct had a dilated endosonographic appearance in the pancreatic head, genu of the pancreas, body of the pancreas and tail of the pancreas. Mild SB dilation noted in the body. -  There was no sign of significant pathology in the pancreatic head, genu of the pancreas, pancreatic body and pancreatic tail otherwise in parenchymal evaluation. --ERCP findings: Prior biliary sphincterotomy appeared open, the entire main bile duct was mildly dilated, sphincteroplasty performed, the biliary tree was swept and nothing was found.   HPI:    Chief Complaint: abdominal pain   Caitlin Peters is a 59 y.o. female with Crohn's disease and short gut syndrome. She has been on TPN for years.  She has a history of recurrent line sepsis. She was followed by Dr. Marcy Peters at the [facility] but relocated to [place] in November 2020. Since then she has spent most of the time in the hospital in [place] ( recurrent line sepsis). She did she [place] in the offic in December 2020.     Patient has has numerous surgeries for Crohn's disease culminating in short gut syndrome requiring TPN since 2003. She has chronic pain managed with narcotics.   11/25/19 Established care in office with Dr. Tarri Peters who referred her to [facility]. She had an initial visit with Caitlin Layman, MD on 04/01/20.    04/01/20 - Dr. Domenica Peters began taking dose of Entocort, currently 6 mg day). She was prescribed Lomotil QID and Imodium increased to QID. Darrhea better during the day  ( 2-3 times / day) but having 4-5 episodes during the night.  Also, Gattex was discussed. She didn't tolerate it before at  [facility] (caused abdominal pain) but Dr. Domenica Peters wants to try it again.   04/13/20 - Saw ID yesterday. She was having severe abdominal pain so arrangements made for her to be admitted. Pain started 4 days ago. If would start just prior to [facility], get worse after defecation. The pain is in really generalized in nature but also radiating down into left leg. This is not a new pain, has had it many times through the years but getting worse. Pain sometimes correlates with Crohn's flare but other times not. She had one episode  of nausea and vomiting when pain started 4 days ago and none since. Is still nauseated. Takes phenergan at home  Q 6 hours at home every day, not getting it here. Zofran doesn't work for her.    Past Medical History:  Diagnosis Date  . Acute pancreatitis 04/13/2020  . Anasarca 10/2019  . AVN (avascular necrosis of bone) ([place])   . Cataract   . Chronic pain syndrome   . CKD (chronic kidney disease), stage III   . Crohn disease ([place])   . Crohn disease ([place])   . Depression   . Diverticulosis   . GERD (gastroesophageal reflux disease)   . HTN (hypertension)   . IDA (iron deficiency anemia)   . Malnutrition ([place])   . Mass in chest   . Osteoporosis   . Pancreatitis   . Short gut syndrome   . Vitamin B12 deficiency     Past Surgical History:  Procedure Laterality Date  . ABDOMINAL HYSTERECTOMY    . ABDOMINAL SURGERY     colon resection with abdominal stoma, repair of intestinal leak, reversal of abdominal stoma  . BILIARY DILATION  11/26/2019   Procedure: BILIARY DILATION;  Surgeon: Jackquline Denmark, MD;  Location: [facility];  Service: Endoscopy;;  . BILIARY DILATION  03/08/2020   Procedure: BILIARY DILATION;  Surgeon: Irving Copas., MD;  Location: [place];  Service: Gastroenterology;;  . BIOPSY  03/08/2020   Procedure: BIOPSY;  Surgeon: Irving Copas., MD;  Location: [place];  Service: Gastroenterology;;  . CHEST WALL RESECTION     right thoracotomy,resection of chest mass with anterior rib and reconstruction using prosthetic mesh and video arthroscopy  . CHOLECYSTECTOMY    . COLONOSCOPY  2019  . ERCP N/A 11/26/2019   Procedure: ENDOSCOPIC RETROGRADE CHOLANGIOPANCREATOGRAPHY (ERCP);  Surgeon: Jackquline Denmark, MD;  Location: [facility];  Service: Endoscopy;  Laterality: N/A;  . ERCP N/A 03/08/2020   Procedure: ENDOSCOPIC RETROGRADE CHOLANGIOPANCREATOGRAPHY (ERCP);  Surgeon: Irving Copas., MD;  Location: [place];  Service: Gastroenterology;   Laterality: N/A;  . ESOPHAGOGASTRODUODENOSCOPY N/A 03/08/2020   Procedure: ESOPHAGOGASTRODUODENOSCOPY (EGD);  Surgeon: Irving Copas., MD;  Location: [place];  Service: Gastroenterology;  Laterality: N/A;  . EUS N/A 03/08/2020   Procedure: UPPER ENDOSCOPIC ULTRASOUND (EUS) LINEAR;  Surgeon: Irving Copas., MD;  Location: [place];  Service: Gastroenterology;  Laterality: N/A;  . IR FLUORO GUIDE CV LINE LEFT  01/07/2020  . IR FLUORO GUIDE CV LINE LEFT  03/09/2020  . IR PTA VENOUS EXCEPT DIALYSIS CIRCUIT  01/07/2020  . IR US GUIDE VASC ACCESS LEFT     x 2 06/17/19 and 09/14/2019  . KNEE SURGERY     right knee   . REMOVAL OF STONES  11/26/2019   Procedure: REMOVAL OF STONES;  Surgeon: Jackquline Denmark, MD;  Location: [facility];  Service: Endoscopy;;  . REMOVAL OF STONES  03/08/2020   Procedure: REMOVAL OF STONES;  Surgeon:  Mansouraty, Telford Nab., MD;  Location: [place];  Service: Gastroenterology;;  . SMALL INTESTINE SURGERY     x3  . SPHINCTEROTOMY  11/26/2019   Procedure: SPHINCTEROTOMY;  Surgeon: Jackquline Denmark, MD;  Location: [facility];  Service: Endoscopy;;  . UPPER GASTROINTESTINAL ENDOSCOPY      Prior to Admission medications   Medication Sig Start Date End Date Taking? Authorizing Provider  acetaminophen (TYLENOL) 325 MG tablet Take 650 mg by mouth every 6 (six) hours as needed for mild pain.    Yes [provider]  amLODipine (NORVASC) 5 MG tablet Take 1 tablet (5 mg total) by mouth daily. 10/16/19  Yes Caitlin Peters, Caitlin Halsted, MD  budesonide (ENTOCORT EC) 3 MG 24 hr capsule TAKE 3 CAPSULES BY MOUTH ONCE DAILY Patient taking differently: Take 6 mg by mouth daily.  04/05/20  Yes Caitlin Peters, Caitlin Halsted, MD  buPROPion [facility] SR) 100 MG 12 hr tablet Take 200 mg by mouth daily.   Yes [provider]  cholecalciferol (VITAMIN D3) 25 MCG (1000 UT) tablet Take 2,000 Units by mouth daily.   Yes [provider]  cycloSPORINE  (RESTASIS) 0.05 % ophthalmic emulsion Place 1 drop into both eyes 2 (two) times daily.   Yes [provider]  dexlansoprazole (DEXILANT) 60 MG capsule Take 1 capsule (60 mg total) by mouth daily. 10/16/19  Yes Caitlin Peters, Caitlin Halsted, MD  Dextran 70-Hypromellose 0.1-0.3 % SOLN Place 1 drop into both eyes 4 (four) times daily.   Yes [provider]  dicyclomine (BENTYL) 20 MG tablet Take 20 mg by mouth 3 (three) times daily. 10/26/19  Yes [provider]  diphenoxylate-atropine (LOMOTIL) 2.5-0.025 MG tablet Take 1 tablet by mouth 4 (four) times daily as needed for diarrhea or loose stools. 10/31/19  Yes Dessa Phi, DO  DULoxetine (CYMBALTA) 30 MG capsule Take 3 capsules (90 mg total) by mouth daily. 11/01/19  Yes Dessa Phi, DO  DULoxetine (CYMBALTA) 60 MG capsule Take 60 mg by mouth daily. 12/09/19  Yes [provider]  estradiol (ESTRACE) 2 MG tablet Take 1 tablet (2 mg total) by mouth daily. 12/10/19  Yes Caitlin Peters, Caitlin Halsted, MD  famotidine (PEPCID) 20 MG tablet Take 20 mg by mouth at bedtime.   Yes [provider]  HYDROmorphone (DILAUDID) 4 MG tablet Take 1 tablet (4 mg total) by mouth every 6 (six) hours as needed for severe pain. 12/06/19  Yes Ripley Fraise, MD  lipase/protease/amylase (CREON) 36000 UNITS CPEP capsule Take 36,000 Units by mouth 3 (three) times daily as needed (with meals).   Yes [provider]  loperamide (IMODIUM) 2 MG capsule TAKE 1 CAPSULE BY MOUTH   AS NEEDED FOR DIARRHEA OR  LOOSE  STOOLS Patient taking differently: Take 2 mg by mouth in the morning, at noon, in the evening, and at bedtime.  03/22/20  Yes Caitlin Peters, Caitlin Halsted, MD  methadone (DOLOPHINE) 10 MG tablet Take 1 tablet (10 mg total) by mouth every 8 (eight) hours. Patient taking differently: Take 5 mg by mouth See admin instructions. 5 times daily 01/11/20  Yes Hongalgi, Lenis Dickinson, MD  methadone (DOLOPHINE) 5 MG tablet Take 5 mg by mouth 5  (five) times daily.  03/29/20  Yes [provider]  metoprolol tartrate (LOPRESSOR) 100 MG tablet Take 1 tablet (100 mg total) by mouth 2 (two) times daily. 03/03/20  Yes Caitlin Peters, Caitlin Halsted, MD  Multiple Vitamins-Minerals (MULTIVITAMIN ADULT PO) Take 1 tablet by mouth daily.  Yes [provider]  Omega-3 1000 MG CAPS Take 1 capsule by mouth daily.   Yes [provider]  potassium chloride SA (KLOR-CON) 20 MEQ tablet Take 40 mEq by mouth daily.   Yes [provider]  PRESCRIPTION MEDICATION Home TPN [employer] in [place]   Yes [provider]  Probiotic Product (PROBIOTIC-10 PO) Take 1 capsule by mouth daily.   Yes [provider]  promethazine (PHENERGAN) 25 MG tablet TAKE 1 TABLET BY MOUTH EVERY 6 HOURS AS NEEDED FOR NAUSEA AND VOMITING Patient taking differently: Take 25 mg by mouth every 6 (six) hours. TAKE 1 TABLET BY MOUTH EVERY 6 HOURS AS NEEDED FOR NAUSEA AND VOMITING 04/12/20  Yes Caitlin Peters, Caitlin Halsted, MD  sucralfate (CARAFATE) 1 GM/10ML suspension Take 10 mLs (1 g total) by mouth 4 (four) times daily -  with meals and at bedtime. 12/15/19  Yes Caitlin Peters, Caitlin Halsted, MD  traZODone (DESYREL) 100 MG tablet Take 2 tablets (200 mg total) by mouth at bedtime as needed for sleep. 12/31/19  Yes Caitlin Peters, Caitlin Halsted, MD  vitamin B-12 (CYANOCOBALAMIN) 100 MCG tablet Take 1,000 mcg by mouth daily.    Yes [provider]  buPROPion (WELLBUTRIN SR) 100 MG 12 hr tablet Take 2 tablets (200 mg total) by mouth daily. 12/15/19 03/14/20  Erline Hau, MD    Current Facility-Administered Medications  Medication Dose Route Frequency Provider Last Rate Last Admin  . acetaminophen (TYLENOL) tablet 650 mg  650 mg Oral Q6H PRN Damita Lack, MD   650 mg at 04/13/20 2034   Or  . acetaminophen (TYLENOL) suppository 650 mg  650 mg Rectal Q6H PRN Amin, Ankit Chirag, MD      . alum & mag hydroxide-simeth  (MAALOX/MYLANTA) 200-200-20 MG/5ML suspension 30 mL  30 mL Oral Q4H PRN Amin, Ankit Chirag, MD      . amLODipine (NORVASC) tablet 5 mg  5 mg Oral Daily Amin, Ankit Chirag, MD   5 mg at 04/14/20 0917  . budesonide (ENTOCORT EC) 24 hr capsule 9 mg  9 mg Oral Daily Amin, Ankit Chirag, MD      . buPROPion (WELLBUTRIN SR) 12 hr tablet 200 mg  200 mg Oral Daily Amin, Ankit Chirag, MD      . Chlorhexidine Gluconate Cloth 2 % PADS 6 each  6 each Topical Daily Amin, Jeanella Flattery, MD   6 each at 04/14/20 1030  . cholecalciferol (VITAMIN D3) tablet 2,000 Units  2,000 Units Oral Daily Amin, Ankit Chirag, MD      . cycloSPORINE (RESTASIS) 0.05 % ophthalmic emulsion 1 drop  1 drop Both Eyes BID Amin, Jeanella Flattery, MD   1 drop at 04/14/20 0925  . DULoxetine (CYMBALTA) DR capsule 90 mg  90 mg Oral Daily Amin, Ankit Chirag, MD      . famotidine (PEPCID) tablet 20 mg  20 mg Oral QHS Amin, Ankit Chirag, MD      . guaiFENesin-dextromethorphan (ROBITUSSIN DM) 100-10 MG/5ML syrup 5 mL  5 mL Oral Q4H PRN Amin, Ankit Chirag, MD      . heparin injection 5,000 Units  5,000 Units Subcutaneous Q8H Amin, Ankit Chirag, MD   5,000 Units at 04/14/20 0514  . hydrALAZINE (APRESOLINE) injection 10 mg  10 mg Intravenous Q4H PRN Amin, Ankit Chirag, MD      . hydrocortisone (ANUSOL-HC) 2.5 % rectal cream 1 application  1 application Topical QID PRN Damita Lack, MD      .  hydrocortisone cream 1 % 1 application  1 application Topical TID PRN Amin, Ankit Chirag, MD      . HYDROmorphone (DILAUDID) injection 1 mg  1 mg Intravenous Q3H PRN Damita Lack, MD   1 mg at 04/14/20 1207  . HYDROmorphone (DILAUDID) tablet 4 mg  4 mg Oral Q6H PRN Amin, Ankit Chirag, MD      . lactated ringers infusion   Intravenous Continuous Damita Lack, MD 100 mL/hr at 04/13/20 1937 New Bag at 04/13/20 1937  . lip balm (CARMEX) ointment 1 application  1 application Topical PRN Amin, Ankit Chirag, MD      . lipase/protease/amylase (CREON) capsule  36,000 Units  36,000 Units Oral TID with meals Amin, Ankit Chirag, MD      . loratadine (CLARITIN) tablet 10 mg  10 mg Oral Daily PRN Amin, Ankit Chirag, MD      . methadone (DOLOPHINE) tablet 5 mg  5 mg Oral 5 X Daily Amin, Ankit Chirag, MD   5 mg at 04/14/20 3244  . metoprolol tartrate (LOPRESSOR) tablet 100 mg  100 mg Oral BID Damita Lack, MD   100 mg at 04/14/20 0102  . multivitamin with minerals tablet   Oral Daily Amin, Ankit Chirag, MD      . Muscle Rub CREA 1 application  1 application Topical PRN Amin, Ankit Chirag, MD      . omega-3 acid ethyl esters (LOVAZA) capsule 1,000 mg  1,000 mg Oral Daily Amin, Ankit Chirag, MD      . ondansetron (ZOFRAN) tablet 4 mg  4 mg Oral Q6H PRN Amin, Ankit Chirag, MD       Or  . ondansetron (ZOFRAN) injection 4 mg  4 mg Intravenous Q6H PRN Amin, Jeanella Flattery, MD   4 mg at 04/13/20 1938  . oxyCODONE (Oxy IR/ROXICODONE) immediate release tablet 5 mg  5 mg Oral Q4H PRN Amin, Ankit Chirag, MD      . pantoprazole (PROTONIX) EC tablet 40 mg  40 mg Oral Daily Amin, Ankit Chirag, MD      . phenol (CHLORASEPTIC) mouth spray 1 spray  1 spray Mouth/Throat PRN Amin, Ankit Chirag, MD      . polyethylene glycol (MIRALAX / GLYCOLAX) packet 17 g  17 g Oral Daily PRN Amin, Ankit Chirag, MD      . polyvinyl alcohol (LIQUIFILM TEARS) 1.4 % ophthalmic solution 1 drop  1 drop Both Eyes PRN Amin, Ankit Chirag, MD      . polyvinyl alcohol (LIQUIFILM TEARS) 1.4 % ophthalmic solution 1 drop  1 drop Both Eyes QID Amin, Ankit Chirag, MD   1 drop at 04/13/20 2230  . promethazine (PHENERGAN) tablet 25 mg  25 mg Oral Q6H PRN Amin, Ankit Chirag, MD      . senna-docusate (Senokot-S) tablet 2 tablet  2 tablet Oral QHS PRN Amin, Ankit Chirag, MD      . sodium chloride (OCEAN) 0.65 % nasal spray 1 spray  1 spray Each Nare PRN Amin, Ankit Chirag, MD      . sodium chloride flush (NS) 0.9 % injection 10-40 mL  10-40 mL Intracatheter Q12H Amin, Ankit Chirag, MD      . sodium chloride  flush (NS) 0.9 % injection 10-40 mL  10-40 mL Intracatheter PRN Amin, Ankit Chirag, MD   10 mL at 04/14/20 1038  . sucralfate (CARAFATE) 1 GM/10ML suspension 1 g  1 g Oral TID WC & HS Amin, Jeanella Flattery, MD      .  traZODone (DESYREL) tablet 200 mg  200 mg Oral QHS PRN Amin, Ankit Chirag, MD      . vitamin B-12 (CYANOCOBALAMIN) tablet 1,000 mcg  1,000 mcg Oral Daily Amin, Jeanella Flattery, MD        Allergies as of 04/13/2020 - Review Complete 04/13/2020  Allergen Reaction Noted  . Cefepime Other (See Comments) 11/27/2017  . Gabapentin Other (See Comments) 10/13/2019  . Hyoscyamine Hives and Swelling 07/15/2014  . Lyrica [pregabalin] Other (See Comments) 10/13/2019  . Meperidine Hives 08/14/2011  . Topamax [topiramate] Other (See Comments) 10/13/2019  . Fentanyl Rash 10/12/2019  . Morphine and related Rash 10/12/2019    Family History  Problem Relation Age of Onset  . Breast cancer Sister   . Multiple sclerosis Sister   . Diabetes Sister   . Lupus Sister   . Colon cancer Other   . Crohn's disease Other   . Seizures Mother   . Glaucoma Mother   . CAD Father   . Heart disease Father   . Hypertension Father     Social History   Socioeconomic History  . Marital status: Single    Spouse name: Not on file  . Number of children: Not on file  . Years of education: Not on file  . Highest education level: Not on file  Occupational History  . Occupation: disabled  Tobacco Use  . Smoking status: Former Research scientist (life sciences)  . Smokeless tobacco: Never Used  Substance and Sexual Activity  . Alcohol use: Not Currently  . Drug use: Never  . Sexual activity: Not on file  Other Topics Concern  . Not on file  Social History Narrative  . Not on file   Social Determinants of Health   Financial Resource Strain:   . Difficulty of Paying Living Expenses:   Food Insecurity:   . Worried About Charity fundraiser in the Last Year:   . Arboriculturist in the Last Year:   Transportation Needs:   .  Film/video editor (Medical):   [name] Kitchen Lack of Transportation (Non-Medical):   Physical Activity:   . Days of Exercise per Week:   . Minutes of Exercise per Session:   Stress:   . Feeling of Stress :   Social Connections:   . Frequency of Communication with Friends and Family:   . Frequency of Social Gatherings with Friends and Family:   . Attends Religious Services:   . Active Member of Clubs or Organizations:   . Attends Archivist Meetings:   [name] Kitchen Marital Status:   Intimate Partner Violence:   . Fear of Current or Ex-Partner:   . Emotionally Abused:   [name] Kitchen Physically Abused:   . Sexually Abused:     Review of Systems: All systems reviewed and negative except where noted in HPI.  Physical Exam: Vital signs in last 24 hours: Temp:  [98.3 F (36.8 C)-99.1 F (37.3 C)] 98.6 F (37 C) (05/20 6378) Pulse Rate:  [42-93] 93 (05/20 0921) Resp:  [16-18] 18 (05/20 0611) BP: (122-148)/(60-71) 128/60 (05/20 0921) SpO2:  [100 %] 100 % (05/20 5885) Weight:  [67.1 kg] 67.1 kg (05/19 2035)   General:   Alert, female in NAD Psych:  Pleasant, cooperative. Normal mood and affect. Eyes:  Pupils equal, sclera clear, no icterus.   Conjunctiva pink. Ears:  Normal auditory acuity. Nose:  No deformity, discharge,  or lesions. Neck:  Supple; no masses Lungs:  Clear throughout to auscultation.   No wheezes, crackles, or  rhonchi.  Heart:  Regular rate and rhythm; no murmurs, no lower extremity edema Abdomen:  Soft, non-distended, diffuse tenderness to very light palpation.  BS active, no palp mass   Rectal:  Deferred  Msk:  Symmetrical without gross deformities. . Neurologic:  Alert and  oriented x4;  grossly normal neurologically. Skin:  Intact without significant lesions or rashes.   Intake/Output from previous day: 05/19 0701 - 05/20 0700 In: 776.7 [I.V.:471.8; IV Piggyback:304.9] Out: 0  Intake/Output this shift: Total I/O In: 306.1 [I.V.:306.1] Out: -   Lab Results: Recent  Labs    04/13/20 1920 04/14/20 1043  WBC 4.8 3.6*  HGB 8.8* 8.7*  HCT 26.8* 27.0*  PLT 163 157   BMET Recent Labs    04/13/20 1920 04/14/20 1043  NA 139 138  K 3.3* 3.7  CL 101 105  CO2 27 25  GLUCOSE 97 83  BUN 53* 41*  CREATININE 1.74* 1.61*  CALCIUM 9.4 8.8*   LFT Recent Labs    04/14/20 1043  PROT 6.4*  ALBUMIN 3.1*  AST 28  ALT 31  ALKPHOS 110  BILITOT 0.8   PT/INR No results for input(s): LABPROT, INR in the last 72 hours. Hepatitis Panel No results for input(s): HEPBSAG, HCVAB, HEPAIGM, HEPBIGM in the last 72 hours.   . CBC Latest Ref Rng & Units 04/14/2020 04/13/2020 03/10/2020  WBC 4.0 - 10.5 K/uL 3.6(L) 4.8 2.7(L)  Hemoglobin 12.0 - 15.0 g/dL 8.7(L) 8.8(L) 8.0(L)  Hematocrit 36.0 - 46.0 % 27.0(L) 26.8(L) 24.3(L)  Platelets 150 - 400 K/uL 157 163 122(L)    . CMP Latest Ref Rng & Units 04/14/2020 04/13/2020 03/10/2020  Glucose 70 - 99 mg/dL 83 97 85  BUN 6 - 20 mg/dL 41(H) 53(H) 18  Creatinine 0.44 - 1.00 mg/dL 1.61(H) 1.74(H) 1.88(H)  Sodium 135 - 145 mmol/L 138 139 139  Potassium 3.5 - 5.1 mmol/L 3.7 3.3(L) 3.5  Chloride 98 - 111 mmol/L 105 101 110  CO2 22 - 32 mmol/L 25 27 22   Calcium 8.9 - 10.3 mg/dL 8.8(L) 9.4 9.0  Total Protein 6.5 - 8.1 g/dL 6.4(L) 7.0 6.4(L)  Total Bilirubin 0.3 - 1.2 mg/dL 0.8 0.7 0.9  Alkaline Phos 38 - 126 U/L 110 112 247(H)  AST 15 - 41 U/L 28 18 41  ALT 0 - 44 U/L 31 27 233(H)   Studies/Results: CT ABDOMEN PELVIS WO CONTRAST  Result Date: 04/13/2020 CLINICAL DATA:  Nausea and vomiting. Patient has history of cholecystectomy, appendectomy, and previous colon surgery for Crohn's disease. EXAM: CT ABDOMEN AND PELVIS WITHOUT CONTRAST TECHNIQUE: Multidetector CT imaging of the abdomen and pelvis was performed following the standard protocol without IV contrast. COMPARISON:  CT 03/05/2020, MRCP 03/07/2020 FINDINGS: Lower chest: The lung bases are clear. Hepatobiliary: Minimal pneumobilia in the left lobe likely related to  prior ERCP. No focal hepatic lesion. Prior cholecystectomy. No biliary dilatation on noncontrast exam. Pancreas: Proximal pancreatic ductal dilatation of 5 mm which appears similar to prior exam. There is suggestion of retained contrast in the pancreatic duct from prior ERCP. No peripancreatic fat stranding. No peripancreatic fluid collection. Spleen: Normal in size without focal abnormality. Adrenals/Urinary Tract: No adrenal nodule. No hydronephrosis or perinephric edema. No renal stone. Urinary bladder is decompressed. Stomach/Bowel: Stomach is unremarkable. Duodenum approaches the midline with small-bowel than courses in the right abdomen, unchanged. There is no evidence of bowel obstruction or inflammation. Prior right hemicolectomy. Additional chain sutures within small bowel. Enteric contrast reaches the ileocolic junction. Mild gaseous  distension of transverse, descending, and to a lesser extent sigmoid colon is also seen on prior exam. Scattered liquid stool in the colon. No colonic wall thickening or inflammation. Vascular/Lymphatic: Left common femoral venous catheter tip is in the IVC. Mild aortic atherosclerosis. No bulky adenopathy, evaluation for enlarged lymph nodes is limited in the absence of IV contrast and paucity of intra-abdominal fat. Reproductive: Status post hysterectomy. No adnexal masses. Other: No ascites, free air, or significant inflammatory change. No body wall hernia. Musculoskeletal: There are no acute or suspicious osseous abnormalities. Chronic avascular necrosis of the left femoral head. Chronic bone island in the left pelvis. IMPRESSION: 1. No acute findings in the abdomen/pelvis. 2. Unchanged pancreatic ductal dilatation without evidence of pancreatitis/pancreatic inflammation. 3. No acute bowel inflammation. Mild chronic colonic distension, unchanged from prior exam. No acute inflammation. Aortic Atherosclerosis (ICD10-I70.0). Electronically Signed   By: Keith Rake M.D.    On: 04/13/2020 21:37    Tye Savoy, NP-C @  04/14/2020, 12:07 PM   ________________________________________________________________________  [facility] Heckler GI MD note:  I personally examined the patient, reviewed the data and agree with the assessment and plan described above. NO sign of abscess or acute bowel or pancreas inflammation.  I suspect a lot of her pains are related to severe adhesive disease. She would like an increase in her pain meds, I will leave that to hosp team. Hopefully restarting her home phenergan will help her nausea.  Needs pain control but otherwise no real changes in her Crohn's therapy. She will follow up with Dr. Domenica Peters at [facility].  Please call or page with any further questions or concerns.   Owens Loffler, MD [facility] Gastroenterology Pager [phone]

## 2020-04-14 NOTE — Progress Notes (Signed)
Initial Nutrition Assessment  INTERVENTION:   -Cyclic TPN per Pharmacy  NUTRITION DIAGNOSIS:   Increased nutrient needs related to chronic illness(short gut syndrome) as evidenced by estimated needs.  GOAL:   Patient will meet greater than or equal to 90% of their needs  MONITOR:   PO intake, Labs, Weight trends, I & O's(TPN)  REASON FOR ASSESSMENT:   Consult New TPN/TNA  ASSESSMENT:   59 y.o. 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  Typically takes Imodium 4 times daily and has 4 bowel movements but despite of doing this during this time she was having about 10 watery bowel movements daily.  Patient reports not consuming any PO for a week d/t pancreatitis symptoms. Pt has been on home TPN since 2003 d/t short gut syndrome. Pt states typically she has honey wheat toast for breakfast and some watermelon. Drinks fruit smoothies sometimes during the day. States the current TPN regimen works for her and she is happy with where her weight is.  Weight is currently stable.   TPN to resume today. Home regimen provides 100g protein, 300g dextrose and 40g lipids per Pharmacy note.  I/Os: +786 ml since admit  Medications: Vitamin D, Creon, Multivitamin with minerals daily, Lovaza, Vitamin B-12, KCl  Labs reviewed: Low K  NUTRITION - FOCUSED PHYSICAL EXAM:    Most Recent Value  Orbital Region  No depletion  Upper Arm Region  No depletion  Thoracic and Lumbar Region  No depletion  Buccal Region  No depletion  Temple Region  No depletion  Clavicle Bone Region  No depletion  Clavicle and Acromion Bone Region  No depletion  Scapular Bone Region  No depletion  Dorsal Hand  No depletion  Patellar Region  No  depletion  Anterior Thigh Region  No depletion  Posterior Calf Region  No depletion  Edema (RD Assessment)  None       Diet Order:   Diet Order            Diet NPO time specified  Diet effective now              EDUCATION NEEDS:   No education needs have been identified at this time  Skin:  Skin Assessment: Reviewed RN Assessment  Last BM:  PTA  Height:   Ht Readings from Last 1 Encounters:  04/13/20 5' 8"  (1.727 m)    Weight:   Wt Readings from Last 1 Encounters:  04/13/20 67.1 kg   BMI:  Body mass index is 22.5 kg/m.  Estimated Nutritional Needs:   Kcal:  2000-2200  Protein:  100-115g  Fluid:  2L/day    Clayton Bibles, MS, RD, LDN Inpatient Clinical Dietitian Contact information available via Amion

## 2020-04-14 NOTE — Progress Notes (Signed)
Hypoglycemic Event  CBG: 66  Treatment: D50 57m = 12.535m Symptoms: None  Follow-up CBG: [id number] Result:[id number]  Possible Reasons for Event: Other : NPO status  Comments/MD notified: 1830 Dr RaMaren Beachotified. 1864d Changed IVF.     Caitlin Peters

## 2020-04-15 LAB — COMPREHENSIVE METABOLIC PANEL
ALT: 27 U/L (ref 0–44)
AST: 22 U/L (ref 15–41)
Albumin: 3.1 g/dL — ABNORMAL LOW (ref 3.5–5.0)
Alkaline Phosphatase: 114 U/L (ref 38–126)
Anion gap: 5 (ref 5–15)
BUN: 42 mg/dL — ABNORMAL HIGH (ref 6–20)
CO2: 26 mmol/L (ref 22–32)
Calcium: 9 mg/dL (ref 8.9–10.3)
Chloride: 108 mmol/L (ref 98–111)
Creatinine, Ser: 1.49 mg/dL — ABNORMAL HIGH (ref 0.44–1.00)
GFR calc Af Amer: 44 mL/min — ABNORMAL LOW (ref >60)
GFR calc non Af Amer: 38 mL/min — ABNORMAL LOW (ref >60)
Glucose, Bld: 117 mg/dL — ABNORMAL HIGH (ref 70–99)
Potassium: 4.5 mmol/L (ref 3.5–5.1)
Sodium: 139 mmol/L (ref 135–145)
Total Bilirubin: 0.7 mg/dL (ref 0.3–1.2)
Total Protein: 6.3 g/dL — ABNORMAL LOW (ref 6.5–8.1)

## 2020-04-15 LAB — CBC
HCT: 25.8 % — ABNORMAL LOW (ref 36.0–46.0)
Hemoglobin: 8.3 g/dL — ABNORMAL LOW (ref 12.0–15.0)
MCH: 30.7 pg (ref 26.0–34.0)
MCHC: 32.2 g/dL (ref 30.0–36.0)
MCV: 95.6 fL (ref 80.0–100.0)
Platelets: 155 10*3/uL (ref 150–400)
RBC: 2.7 MIL/uL — ABNORMAL LOW (ref 3.87–5.11)
RDW: 13.7 % (ref 11.5–15.5)
WBC: 3.3 10*3/uL — ABNORMAL LOW (ref 4.0–10.5)
nRBC: 0 % (ref 0.0–0.2)

## 2020-04-15 LAB — GLUCOSE, CAPILLARY
Glucose-Capillary: 113 mg/dL — ABNORMAL HIGH (ref 70–99)
Glucose-Capillary: 114 mg/dL — ABNORMAL HIGH (ref 70–99)
Glucose-Capillary: 114 mg/dL — ABNORMAL HIGH (ref 70–99)
Glucose-Capillary: 116 mg/dL — ABNORMAL HIGH (ref 70–99)
Glucose-Capillary: 119 mg/dL — ABNORMAL HIGH (ref 70–99)
Glucose-Capillary: 72 mg/dL (ref 70–99)
Glucose-Capillary: 84 mg/dL (ref 70–99)

## 2020-04-15 LAB — MAGNESIUM: Magnesium: 2.1 mg/dL (ref 1.7–2.4)

## 2020-04-15 MED ORDER — RISAQUAD PO CAPS: ORAL_CAPSULE | Freq: Every day | ORAL | Status: DC

## 2020-04-15 MED ORDER — TRAVASOL 10 % IV SOLN
INTRAVENOUS | Status: AC
  Filled 2020-04-15: qty 1000

## 2020-04-15 MED ORDER — DEXTROSE-NACL 5-0.9 % IV SOLN: INTRAVENOUS | Status: DC

## 2020-04-15 MED ORDER — HYDROMORPHONE HCL 1 MG/ML IJ SOLN
1.0000 mg | INTRAMUSCULAR | Status: DC | PRN
  Administered 2020-04-15 (×2): 2 mg via INTRAVENOUS
  Administered 2020-04-15: 1 mg via INTRAVENOUS
  Administered 2020-04-15 – 2020-04-18 (×22): 2 mg via INTRAVENOUS
  Filled 2020-04-15 (×14): qty 2
  Filled 2020-04-15: qty 1
  Filled 2020-04-15 (×3): qty 2
  Filled 2020-04-15: qty 1
  Filled 2020-04-15 (×7): qty 2

## 2020-04-15 MED ORDER — LOPERAMIDE HCL 2 MG PO CAPS
2.0000 mg | ORAL_CAPSULE | Freq: Four times a day (QID) | ORAL | Status: DC
  Administered 2020-04-15 – 2020-04-18 (×14): 2 mg via ORAL
  Filled 2020-04-15 (×14): qty 1

## 2020-04-15 MED ORDER — DIPHENOXYLATE-ATROPINE 2.5-0.025 MG PO TABS
1.0000 | ORAL_TABLET | Freq: Four times a day (QID) | ORAL | Status: DC | PRN
  Filled 2020-04-15: qty 1

## 2020-04-15 MED ORDER — DICYCLOMINE HCL 20 MG PO TABS
20.0000 mg | ORAL_TABLET | Freq: Three times a day (TID) | ORAL | Status: DC
  Filled 2020-04-15 (×4): qty 1

## 2020-04-15 NOTE — Progress Notes (Signed)
PROGRESS NOTE    Caitlin Peters  FAO:[id number] DOB: 04-17-1961 DOA: 04/13/2020 PCP: [name], [name], MD   Chef Complaints: diarrhea, generalized cramping abdominal pain and nausea x4 days.  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 Drucilla Schmidt.  Recent admission in April with sepsis with stenotrophomonas bacteremia transition to Bactrim x3 weeks until 04/06/2020, femoral PICC line replaced 4/14 by IR, MRCP showed dilated biliary duct therefore also had EGD/ERCP on 4/13, relatively unremarkable.  Subjective:  5x BM yesterday with pain in lower abdomen worse before and during BM Creat improving on ivf and tpn "I have tried everything at home" still c/o ongoing abdominal pain and asking to increase her iv pain meds Pain in lower back on left side- chronic Reports she is in hospital every month either with line infection or gut infections C  Diff and gip negative.  Assessment & Plan:  Intractable nausea, vomiting, abdominal pain and diarrhea in the setting of short gut syndrome with Crohn's disease: pain likley from her severe adhesive disease.CT abdomen no acute finding. GI panel.  C. difficile was negative. [facility] on board. Cont on IV fluid hydration, symptomatic management, pain control w prn iv dilaudid 1-2 mg q3hr, at home on 4 mg q6h and methadone. Cont home methadone. Resume imodium and lomotil per home schedule, and also bentyl.  Longstanding Crohn's disease: status post multiple resections resulting into short gut syndrome: Symptomatic management as above.  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  Anemia of chronic disease- Monitor Hb  Moderate protein calorie malnutrition: Continue home TPN, on since 2003: Dietitian consulted and cont to augment nutrition, cont TPN. PICC on the left femoral dressing changed yesterday appears clean dry no drainage  Hypoglycemia due to poor p.o. intake, short gut syndrome.  Was placed on dextrose IV fluids briefly and will stop as patient is on TPN.  Sugar improving.  Dysuria: UA relatively stable with negative nitrite, trace leukocytes, WBC 0-5.  Hypokalemia-was repleted.  Essential hypertension: Continue amlodipine metoprolol and monitor.   Chronic pain/depression on methadone 5 mg and Dilaudid.  On trazodone Wellbutrin and Cymbalta.  CKD stage IIIb baseline creatinine 1.6, STABLE.At risk of renal failure continue hydration.  GERD continue PPI and Carafate.  DVT prophylaxisHeparin Code Status: FULL Family Communication: plan of care discussed with patient at bedside.  Status is: Inpatient Remains inpatient appropriate because:IV treatments appropriate due to intensity of illness or inability to take PO, Inpatient level of care appropriate due to severity of illness and ongoing management of short gut syndrome with intractable pain, diarrhea  Dispo: The patient is from: Home              Anticipated d/c is to: Home              Anticipated d/c date is: 2 days  Pt is not medically stable  Nutrition: Diet Order            Diet NPO time specified  Diet effective now              Consultants:see note  Procedures:see note Microbiology:see note  Medications: Scheduled  Meds: . acidophilus   Oral Daily  . amLODipine  5 mg Oral Daily  . budesonide  6 mg Oral Daily  . buPROPion  200 mg Oral Daily  . Chlorhexidine Gluconate Cloth  6 each Topical Daily  . cholecalciferol  2,000 Units Oral Daily  . cycloSPORINE  1 drop Both Eyes BID  . dicyclomine  20 mg Oral TID  . DULoxetine  90 mg Oral Daily  . famotidine  20 mg  Oral QHS  . heparin  5,000 Units Subcutaneous Q8H  . insulin aspart  0-9 Units Subcutaneous 4 times per day  . lipase/protease/amylase  36,000 Units Oral TID with meals  . loperamide  2 mg Oral Q6H  . methadone  5 mg Oral 5 X Daily  . metoprolol tartrate  100 mg Oral BID  . multivitamin with minerals   Oral Daily  . omega-3 acid ethyl esters  1,000 mg Oral Daily  . pantoprazole  40 mg Oral Daily  . polyvinyl alcohol  1 drop Both Eyes QID  . sodium chloride flush  10-40 mL Intracatheter Q12H  . sucralfate  1 g Oral TID WC & HS  . vitamin B-12  1,000 mcg Oral Daily   Continuous Infusions: . [START ON 04/16/2020] dextrose 5 % and 0.9% NaCl    . TPN CYCLIC-ADULT (ION)      Antimicrobials: Anti-infectives (From admission, onward)   None       Objective: Vitals: Today's Vitals   04/15/20 0746 04/15/20 0812 04/15/20 0934 04/15/20 1003  BP:      Pulse:      Resp:      Temp:      TempSrc:      SpO2:      Weight:      Height:      PainSc: 8  3  10-Worst pain ever 3     Intake/Output Summary (Last 24 hours) at 04/15/2020 1127 Last data filed at 04/15/2020 1000 Gross per 24 hour  Intake 2338.96 ml  Output 0 ml  Net 2338.96 ml   Filed Weights   04/13/20 2035  Weight: 67.1 kg   Weight change:    Intake/Output from previous day: 05/20 0701 - 05/21 0700 In: 2071.9 [I.V.:2071.9] Out: 0  Intake/Output this shift: Total I/O In: 573.1 [I.V.:573.1] Out: 0   Examination:  General exam: AAO , in pain, drying, weak HEENT:Oral mucosa moist, Ear/Nose WNL grossly, dentition normal. Respiratory system: bilaterally clear,no wheezing or crackles,no use of accessory muscle Cardiovascular system: S1 & S2 +, No JVD,. Gastrointestinal system: Abdomen soft, Tender lower abdomen,BS+ Nervous System:Alert, awake, moving extremities and grossly nonfocal Extremities: No edema, distal peripheral pulses palpable.  Skin: No rashes,no icterus. MSK: Normal muscle bulk,tone, power Left  thigh with femoral line  Data Reviewed: I have personally reviewed following labs and imaging studies CBC: Recent Labs  Lab 04/13/20 1920 04/14/20 1043 04/15/20 0436  WBC 4.8 3.6* 3.3*  NEUTROABS  --  1.3*  --   HGB 8.8* 8.7* 8.3*  HCT 26.8* 27.0* 25.8*  MCV 94.4 95.7 95.6  PLT 163 157 993   Basic Metabolic Panel: Recent Labs  Lab 04/13/20 1920 04/14/20 1043 04/15/20 0436  NA 139 138 139  K 3.3* 3.7 4.5  CL 101 105 108  CO2 27 25 26   GLUCOSE 97 83 117*  BUN 53* 41* 42*  CREATININE 1.74* 1.61* 1.49*  CALCIUM 9.4 8.8* 9.0  MG 2.2 2.1 2.1  PHOS  --  3.5  --  GFR: Estimated Creatinine Clearance: 41 mL/min (A) (by C-G formula based on SCr of 1.49 mg/dL (H)). Liver Function Tests: Recent Labs  Lab 04/13/20 1920 04/14/20 1043 04/15/20 0436  AST 18 28 22   ALT 27 31 27   ALKPHOS 112 110 114  BILITOT 0.7 0.8 0.7  PROT 7.0 6.4* 6.3*  ALBUMIN 3.4* 3.1* 3.1*   Recent Labs  Lab 04/13/20 1920  LIPASE 17   No results for input(s): AMMONIA in the last 168 hours. Coagulation Profile: No results for input(s): INR, PROTIME in the last 168 hours. Cardiac Enzymes: No results for input(s): CKTOTAL, CKMB, CKMBINDEX, TROPONINI in the last 168 hours. BNP (last 3 results) No results for input(s): PROBNP in the last 8760 hours. HbA1C: No results for input(s): HGBA1C in the last 72 hours. CBG: Recent Labs  Lab 04/14/20 1854 04/14/20 2109 04/15/20 0020 04/15/20 0458 04/15/20 0754  GLUCAP 133* 113* 114* 116* 119*   Lipid Profile: Recent Labs    04/14/20 1043  TRIG 94   Thyroid Function Tests: No results for input(s): TSH, T4TOTAL, FREET4, T3FREE, THYROIDAB in the last 72 hours. Anemia Panel: No results for input(s): VITAMINB12, FOLATE, FERRITIN, TIBC, IRON, RETICCTPCT in the last 72 hours. Sepsis Labs: No results for input(s): PROCALCITON, LATICACIDVEN in the last 168 hours.  Recent Results (from the past 240 hour(s))  Gastrointestinal Panel by PCR , Stool      Status: None   Collection Time: 04/13/20  8:43 PM   Specimen: Rectum; Stool  Result Value Ref Range Status   Campylobacter species NOT DETECTED NOT DETECTED Final   Plesimonas shigelloides NOT DETECTED NOT DETECTED Final   Salmonella species NOT DETECTED NOT DETECTED Final   Yersinia enterocolitica NOT DETECTED NOT DETECTED Final   Vibrio species NOT DETECTED NOT DETECTED Final   Vibrio cholerae NOT DETECTED NOT DETECTED Final   Enteroaggregative E coli (EAEC) NOT DETECTED NOT DETECTED Final   Enteropathogenic E coli (EPEC) NOT DETECTED NOT DETECTED Final   Enterotoxigenic E coli (ETEC) NOT DETECTED NOT DETECTED Final   Shiga like toxin producing E coli (STEC) NOT DETECTED NOT DETECTED Final   Shigella/Enteroinvasive E coli (EIEC) NOT DETECTED NOT DETECTED Final   Cryptosporidium NOT DETECTED NOT DETECTED Final   Cyclospora cayetanensis NOT DETECTED NOT DETECTED Final   Entamoeba histolytica NOT DETECTED NOT DETECTED Final   Giardia lamblia NOT DETECTED NOT DETECTED Final   Adenovirus F40/41 NOT DETECTED NOT DETECTED Final   Astrovirus NOT DETECTED NOT DETECTED Final   Norovirus GI/GII NOT DETECTED NOT DETECTED Final   Rotavirus A NOT DETECTED NOT DETECTED Final   Sapovirus (I, II, IV, and V) NOT DETECTED NOT DETECTED Final    Comment: Performed at [facility], [address] [place], [place] [address]  C Difficile Quick Screen w PCR reflex     Status: None   Collection Time: 04/14/20  2:57 AM   Specimen: STOOL  Result Value Ref Range Status   C Diff antigen NEGATIVE NEGATIVE Final   C Diff toxin NEGATIVE NEGATIVE Final   C Diff interpretation No C. difficile detected.  Final    Comment: Performed at [facility], [place] [address] [place], [place] [address]      Radiology Studies: CT ABDOMEN PELVIS WO CONTRAST  Result Date: 04/13/2020 CLINICAL DATA:  Nausea and vomiting. Patient has history of cholecystectomy, appendectomy, and previous colon  surgery for Crohn's disease. EXAM: CT ABDOMEN AND PELVIS WITHOUT CONTRAST TECHNIQUE: Multidetector CT imaging of the abdomen and  pelvis was performed following the standard protocol without IV contrast. COMPARISON:  CT 03/05/2020, MRCP 03/07/2020 FINDINGS: Lower chest: The lung bases are clear. Hepatobiliary: Minimal pneumobilia in the left lobe likely related to prior ERCP. No focal hepatic lesion. Prior cholecystectomy. No biliary dilatation on noncontrast exam. Pancreas: Proximal pancreatic ductal dilatation of 5 mm which appears similar to prior exam. There is suggestion of retained contrast in the pancreatic duct from prior ERCP. No peripancreatic fat stranding. No peripancreatic fluid collection. Spleen: Normal in size without focal abnormality. Adrenals/Urinary Tract: No adrenal nodule. No hydronephrosis or perinephric edema. No renal stone. Urinary bladder is decompressed. Stomach/Bowel: Stomach is unremarkable. Duodenum approaches the midline with small-bowel than courses in the right abdomen, unchanged. There is no evidence of bowel obstruction or inflammation. Prior right hemicolectomy. Additional chain sutures within small bowel. Enteric contrast reaches the ileocolic junction. Mild gaseous distension of transverse, descending, and to a lesser extent sigmoid colon is also seen on prior exam. Scattered liquid stool in the colon. No colonic wall thickening or inflammation. Vascular/Lymphatic: Left common femoral venous catheter tip is in the IVC. Mild aortic atherosclerosis. No bulky adenopathy, evaluation for enlarged lymph nodes is limited in the absence of IV contrast and paucity of intra-abdominal fat. Reproductive: Status post hysterectomy. No adnexal masses. Other: No ascites, free air, or significant inflammatory change. No body wall hernia. Musculoskeletal: There are no acute or suspicious osseous abnormalities. Chronic avascular necrosis of the left femoral head. Chronic bone island in the left  pelvis. IMPRESSION: 1. No acute findings in the abdomen/pelvis. 2. Unchanged pancreatic ductal dilatation without evidence of pancreatitis/pancreatic inflammation. 3. No acute bowel inflammation. Mild chronic colonic distension, unchanged from prior exam. No acute inflammation. Aortic Atherosclerosis (ICD10-I70.0). Electronically Signed   By: Keith Rake M.D.   On: 04/13/2020 21:37     LOS: 1 day   Antonieta Pert, MD [facility]  04/15/2020, 11:27 AM

## 2020-04-15 NOTE — Progress Notes (Signed)
PHARMACY - TOTAL PARENTERAL NUTRITION CONSULT NOTE   Indication: short gut syndrome, on TPN PTA  Patient Measurements:Height: 5' 8"  (172.7 cm) Weight: 67.1 kg (148 lb) IBW/kg (Calculated) : 63.9 TPN AdjBW (KG): 67.1 Usual Weight: 67.1 kg 04/13/2020 Ht: 68 inches BMI: 22.5  Assessment: 59 yo with PMH Chohn's disease, short gut syndrome on TPN, HTN, chronic pain, depression, CKD3b, GERD. Admitted 5/19 PM for persistent N/V - acute pancreatitis.  Pharmacy consulted to continue TPN as PTA.   Glucose / Insulin: no hx DM, glucose at goal <150 since TPN started, no insulin administered in past 24h Electrolytes: WNL Renal: Scr 1.61 which appears to be her baseline LFTs / TGs: LFTs WNL, TG 94 on 5/20 Prealbumin / albumin: Prealbumin 26 on 5/20 Intake / Output; MIVF: D5NS @ 80m/hr; no output recorded GI Imaging: 5/19 CT abdomen: no acute findings Surgeries / Procedures:   Central access:  +PICC line TPN start date: Has been on TPN since 2003 for short gut syndrome  Nutritional Goals (per RD recommendation on 04/14/20): Kcal:  2000-2200 Protein:  100-115g Fluid:  2L/day  Current Nutrition: TPN  Home TPN per [employer] Previously: 2 Liter cycle over 16 hours protien 100 gm Dextrose 300 gm Lipids 40 gm  Plan:  Continue home cyclic TPN over 150V(from 6p-->10a daily) Electrolytes in TPN: 536m/L of Na, 5065mL of K, 3.5mE56m of Ca, 5mEq6mof Mg, and 10mmo51mof Phos. Cl:Ac ratio 1:1 Add trace elements to TPN.  Oral MVI daily.  Check CBGs and cover with sensitive SSI per cyclic TPN protocol (1h after TPN started, 2h after TPN stopped, once while TPN infusing & once while off TPN)  D5NS @ 30ml/h94m>Hold MIVF while TPN infusing.   Monitor TPN labs on Mon/Thurs  MichellNetta CedarsD, BCPS 04/15/2020 7:46 AM

## 2020-04-16 LAB — BASIC METABOLIC PANEL
Anion gap: 6 (ref 5–15)
BUN: 33 mg/dL — ABNORMAL HIGH (ref 6–20)
CO2: 24 mmol/L (ref 22–32)
Calcium: 9.2 mg/dL (ref 8.9–10.3)
Chloride: 107 mmol/L (ref 98–111)
Creatinine, Ser: 1.26 mg/dL — ABNORMAL HIGH (ref 0.44–1.00)
GFR calc Af Amer: 54 mL/min — ABNORMAL LOW (ref >60)
GFR calc non Af Amer: 47 mL/min — ABNORMAL LOW (ref >60)
Glucose, Bld: 125 mg/dL — ABNORMAL HIGH (ref 70–99)
Potassium: 4.3 mmol/L (ref 3.5–5.1)
Sodium: 137 mmol/L (ref 135–145)

## 2020-04-16 LAB — MAGNESIUM: Magnesium: 2 mg/dL (ref 1.7–2.4)

## 2020-04-16 LAB — GLUCOSE, CAPILLARY
Glucose-Capillary: 103 mg/dL — ABNORMAL HIGH (ref 70–99)
Glucose-Capillary: 104 mg/dL — ABNORMAL HIGH (ref 70–99)
Glucose-Capillary: 114 mg/dL — ABNORMAL HIGH (ref 70–99)
Glucose-Capillary: 124 mg/dL — ABNORMAL HIGH (ref 70–99)
Glucose-Capillary: 126 mg/dL — ABNORMAL HIGH (ref 70–99)
Glucose-Capillary: 80 mg/dL (ref 70–99)
Glucose-Capillary: 92 mg/dL (ref 70–99)

## 2020-04-16 LAB — CBC
HCT: 26.9 % — ABNORMAL LOW (ref 36.0–46.0)
Hemoglobin: 8.6 g/dL — ABNORMAL LOW (ref 12.0–15.0)
MCH: 30.5 pg (ref 26.0–34.0)
MCHC: 32 g/dL (ref 30.0–36.0)
MCV: 95.4 fL (ref 80.0–100.0)
Platelets: 156 10*3/uL (ref 150–400)
RBC: 2.82 MIL/uL — ABNORMAL LOW (ref 3.87–5.11)
RDW: 13.5 % (ref 11.5–15.5)
WBC: 3.8 10*3/uL — ABNORMAL LOW (ref 4.0–10.5)
nRBC: 0 % (ref 0.0–0.2)

## 2020-04-16 MED ORDER — TRAVASOL 10 % IV SOLN
INTRAVENOUS | Status: AC
  Filled 2020-04-16: qty 1000

## 2020-04-16 MED ORDER — DIPHENOXYLATE-ATROPINE 2.5-0.025 MG PO TABS
1.0000 | ORAL_TABLET | Freq: Four times a day (QID) | ORAL | Status: DC
  Administered 2020-04-16 – 2020-04-18 (×11): 1 via ORAL
  Filled 2020-04-16 (×10): qty 1

## 2020-04-16 NOTE — Progress Notes (Signed)
PHARMACY - TOTAL PARENTERAL NUTRITION CONSULT NOTE   Indication: short gut syndrome, on TPN PTA  Patient Measurements:Height: 5' 8"  (172.7 cm) Weight: 67.1 kg (148 lb) IBW/kg (Calculated) : 63.9 TPN AdjBW (KG): 67.1 Usual Weight: 67.1 kg 04/13/2020 Ht: 68 inches BMI: 22.5  Assessment: 59 yo with PMH Chohn's disease, short gut syndrome on TPN, HTN, chronic pain, depression, CKD3b, GERD. Admitted 5/19 PM for persistent N/V - acute pancreatitis.  Pharmacy consulted to continue TPN as PTA.   Glucose / Insulin: no hx DM, glucose at goal <150 since TPN started, 1 unit of insulin administered in past 24h Electrolytes: WNL, stable  Renal: Scr 1.61 which appears to be her baseline LFTs / TGs: LFTs WNL, TG 94 on 5/20 Prealbumin / albumin: Prealbumin 26 on 5/20 Intake / Output; MIVF: D5NS @ 68m/hr; no output recorded GI Imaging: 5/19 CT abdomen: no acute findings Surgeries / Procedures:   Central access:  +PICC line TPN start date: Has been on TPN since 2003 for short gut syndrome  Nutritional Goals (per RD recommendation on 04/14/20): Kcal:  2000-2200 Protein:  100-115g Fluid:  2L/day  Current Nutrition: TPN  Home TPN per [employer] Previously: 2 Liter cycle over 16 hours protien 100 gm Dextrose 300 gm Lipids 40 gm  Plan:   Continue home cyclic TPN over 147M(from 6p-->10a daily)  Electrolytes in TPN: 534m/L of Na, 5057mL of K, 3.5mE10m of Ca, 5mEq41mof Mg, and 10mmo36mof Phos. Cl:Ac ratio 1:1  Add trace elements to TPN.  Oral MVI daily.   Check CBGs and cover with sensitive SSI per cyclic TPN protocol (1h after TPN started, 2h after TPN stopped, once while TPN infusing & once while off TPN)   D5NS @ 30ml/h33m>Hold MIVF while TPN infusing.    BMP, magnesium, phosphorus with AM labs   Monitor TPN labs on Mon/Thurs   Lise Pincus Royetta AsalD, BCPS 04/16/2020 10:18 AM

## 2020-04-16 NOTE — Progress Notes (Signed)
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 Drucilla Schmidt.  Recent admission in April with sepsis with stenotrophomonas bacteremia transition to Bactrim x3 weeks until 04/06/2020, femoral PICC line replaced 4/14 by IR, MRCP showed dilated biliary duct therefore also had EGD/ERCP on 4/13, relatively unremarkable.  Subjective: Having some nausea- wants iv phenergan. Afebrile T-max 98.8 and afebrile. BM 12 times in past 24 hr.  Wants both imodium and lomotil Pain better with iv dilaudid  "I have tried everything at home" still c/o ongoing abdominal pain and asking to increase her iv pain meds Reports she is in hospital every month either with line infection or gut infections  Assessment & Plan:  Intractable nausea, vomiting, abdominal pain and diarrhea: Moderate to severe episode in the setting of short gut syndrome with Crohn's disease: pain likley from her severe adhesive disease.CT abdomen no acute finding. GI panel.  C. difficile was negative.  Seen by [facility] on board.  Continue on symptomatic management with hydration, TPN, pain management w prn iv dilaudid 1-2 mg q3hr, Imodium and Lomotil and bentyl. At home on dilaudid 4 mg q6h and methadone.  imodium and lomotil per home schedule, and also bentyl.  Longstanding Crohn's disease: s/p multiple resections resulting into short gut syndrome: Symptomatic  management as above.  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  Anemia of chronic disease- Monitor Hb  Moderate protein calorie malnutrition, chronically on TPN since 2003: Dietitian on board, continue TPN per pharmacy.  Hypoglycemia due to poor intake now resolved.  Continue TPN off IV dextrose.   Dysuria: UA relatively stable with negative nitrite, trace leukocytes, WBC 0-5.  Hypokalemia-was repleted.  Hypertension: BP uncontrolled continue home amlodipine metoprolol and monitor. use prn meds  Chronic pain/depression on methadone 5 mg and Dilaudid.  On trazodone Wellbutrin and Cymbalta.  CKD stage IIIb baseline creatinine 1.6, STABLE.At risk of renal failure continue hydration.  GERD continue PPI and Carafate.  DVT prophylaxisHeparin Code Status: FULL Family Communication: plan of care discussed with patient at bedside.  Status is: Inpatient Remains inpatient appropriate because:IV treatments appropriate due to intensity of illness or inability to take PO, Inpatient level of care appropriate due to severity of illness and ongoing management of short gut syndrome with intractable pain, diarrhea  Dispo: The patient is from: Home              Anticipated d/c is to: Home              Anticipated d/c date is: 2 days  Pt is not medically stable  Nutrition: Diet Order            Diet NPO time specified  Diet effective now              Consultants:see note  Procedures:see note Microbiology:see note  Medications: Scheduled Meds: . acidophilus   Oral Daily  . amLODipine  5 mg Oral Daily  . budesonide  6 mg Oral Daily  . buPROPion  200 mg Oral Daily  . Chlorhexidine Gluconate Cloth  6 each Topical Daily  . cholecalciferol  2,000 Units Oral Daily  . cycloSPORINE  1 drop Both Eyes BID  . dicyclomine  20 mg Oral TID  . DULoxetine  90 mg Oral Daily  . famotidine  20 mg Oral QHS  . heparin  5,000 Units Subcutaneous Q8H  . insulin aspart  0-9 Units Subcutaneous 4 times  per day  . lipase/protease/amylase  36,000 Units Oral TID with meals  . loperamide  2 mg Oral Q6H  . methadone  5 mg Oral 5 X Daily  . metoprolol tartrate  100 mg Oral BID  . multivitamin with minerals   Oral Daily  . omega-3 acid ethyl esters  1,000 mg Oral Daily  . pantoprazole  40 mg Oral Daily  . polyvinyl alcohol  1 drop Both Eyes QID  . sodium chloride flush  10-40 mL Intracatheter Q12H  . sucralfate  1 g Oral TID WC & HS  . vitamin B-12  1,000 mcg Oral Daily   Continuous Infusions: . dextrose 5 % and 0.9% NaCl    . TPN CYCLIC-ADULT (ION) 161 mL/hr at 04/15/20 2033    Antimicrobials: Anti-infectives (From admission, onward)   None       Objective: Vitals: Today's Vitals   04/16/20 0232 04/16/20 0556 04/16/20 0627 04/16/20 0759  BP:  (!) 167/75    Pulse:  (!) 102    Resp:  18    Temp:  98.8 F (37.1 C)    TempSrc:  Oral    SpO2:  94%    Weight:      Height:      PainSc: 8   9  10-Worst pain ever    Intake/Output Summary (Last 24 hours) at 04/16/2020 0924 Last data filed at 04/16/2020 0800 Gross per 24 hour  Intake 1925.29 ml  Output 0 ml  Net 1925.29 ml   Filed Weights   04/13/20 2035  Weight: 67.1 kg   Weight change:    Intake/Output from previous day: 05/21 0701 - 05/22 0700 In: 1925.3 [I.V.:1925.3] Out: 0  Intake/Output this shift: No intake/output data recorded.  Examination:  General exam: AAOx3 , NAD, weak appearing. HEENT:Oral mucosa moist, Ear/Nose WNL grossly, dentition normal. Respiratory system: bilaterally clear,no wheezing or crackles,no use of accessory muscle Cardiovascular system: S1 & S2 +, No JVD,. Gastrointestinal system: midline scar+, Abdomen soft, Tender on lower abdomen with voluntary guarding,ND, BS+ Nervous System:Alert, awake, moving extremities and grossly nonfocal Extremities: No edema, distal peripheral pulses palpable.  Skin: No rashes,no icterus. MSK: Normal muscle bulk,tone, power   Data Reviewed: I have  personally reviewed following labs and imaging studies CBC: Recent Labs  Lab 04/13/20 1920 04/14/20 1043 04/15/20 0436 04/16/20 0420  WBC 4.8 3.6* 3.3* 3.8*  NEUTROABS  --  1.3*  --   --   HGB 8.8* 8.7* 8.3* 8.6*  HCT 26.8* 27.0* 25.8* 26.9*  MCV 94.4 95.7 95.6 95.4  PLT 163 157 155 096   Basic Metabolic Panel: Recent Labs  Lab 04/13/20 1920 04/14/20 1043 04/15/20 0436 04/16/20 0420  NA 139 138 139 137  K 3.3* 3.7 4.5 4.3  CL 101 105 108 107  CO2 27 25 26 24   GLUCOSE 97 83 117* 125*  BUN 53* 41* 42* 33*  CREATININE 1.74* 1.61* 1.49* 1.26*  CALCIUM 9.4 8.8* 9.0 9.2  MG 2.2 2.1 2.1 2.0  PHOS  --  3.5  --   --    GFR: Estimated Creatinine Clearance: 48.5 mL/min (A) (by C-G formula based on SCr of 1.26 mg/dL (H)). Liver Function Tests: Recent Labs  Lab 04/13/20 1920 04/14/20 1043 04/15/20 0436  AST 18 28 22   ALT 27 31 27   ALKPHOS 112 110 114  BILITOT 0.7 0.8 0.7  PROT 7.0 6.4* 6.3*  ALBUMIN 3.4* 3.1* 3.1*   Recent Labs  Lab 04/13/20 1920  LIPASE 17   No results for input(s): AMMONIA in the last 168 hours. Coagulation Profile: No results for input(s): INR, PROTIME in the last 168 hours. Cardiac Enzymes: No results for input(s): CKTOTAL, CKMB, CKMBINDEX, TROPONINI in the last 168 hours. BNP (last 3 results) No results for input(s): PROBNP in the last 8760 hours. HbA1C: No results for input(s): HGBA1C in the last 72 hours. CBG: Recent Labs  Lab 04/15/20 1605 04/15/20 2032 04/16/20 0010 04/16/20 0403 04/16/20 0810  GLUCAP 72 114* 114* 126* 103*   Lipid Profile: Recent Labs    04/14/20 1043  TRIG 94   Thyroid Function Tests: No results for input(s): TSH, T4TOTAL, FREET4, T3FREE, THYROIDAB in the last 72 hours. Anemia Panel: No results for input(s): VITAMINB12, FOLATE, FERRITIN, TIBC, IRON, RETICCTPCT in the last 72 hours. Sepsis Labs: No results for input(s): PROCALCITON, LATICACIDVEN in the last 168 hours.  Recent Results (from the past 240  hour(s))  Gastrointestinal Panel by PCR , Stool     Status: None   Collection Time: 04/13/20  8:43 PM   Specimen: Rectum; Stool  Result Value Ref Range Status   Campylobacter species NOT DETECTED NOT DETECTED Final   Plesimonas shigelloides NOT DETECTED NOT DETECTED Final   Salmonella species NOT DETECTED NOT DETECTED Final   Yersinia enterocolitica NOT DETECTED NOT DETECTED Final   Vibrio species NOT DETECTED NOT DETECTED Final   Vibrio cholerae NOT DETECTED NOT DETECTED Final   Enteroaggregative E coli (EAEC) NOT DETECTED NOT DETECTED Final   Enteropathogenic E coli (EPEC) NOT DETECTED NOT DETECTED Final   Enterotoxigenic E coli (ETEC) NOT DETECTED NOT DETECTED Final   Shiga like toxin producing E coli (STEC) NOT DETECTED NOT DETECTED Final   Shigella/Enteroinvasive E coli (EIEC) NOT DETECTED NOT DETECTED Final   Cryptosporidium NOT DETECTED NOT DETECTED Final   Cyclospora cayetanensis NOT DETECTED NOT DETECTED Final   Entamoeba histolytica NOT DETECTED NOT DETECTED Final   Giardia lamblia NOT DETECTED NOT DETECTED Final   Adenovirus F40/41 NOT DETECTED NOT DETECTED Final   Astrovirus NOT DETECTED NOT DETECTED Final   Norovirus GI/GII NOT DETECTED NOT DETECTED Final   Rotavirus A NOT DETECTED NOT DETECTED Final   Sapovirus (I, II, IV, and V) NOT DETECTED NOT DETECTED Final    Comment: Performed at [facility], [address] [place], [place] [address]  C Difficile Quick Screen w PCR reflex     Status: None   Collection Time: 04/14/20  2:57 AM   Specimen: STOOL  Result Value Ref Range Status   C Diff antigen NEGATIVE NEGATIVE Final   C Diff toxin NEGATIVE NEGATIVE Final   C Diff interpretation No C. difficile detected.  Final    Comment: Performed at [facility], [place] [address], [place], [place] [address]      Radiology Studies: No results found.   LOS: 2 days   Antonieta Pert, MD [facility]  04/16/2020, 9:24 AM

## 2020-04-17 LAB — CBC
HCT: 27.3 % — ABNORMAL LOW (ref 36.0–46.0)
Hemoglobin: 8.8 g/dL — ABNORMAL LOW (ref 12.0–15.0)
MCH: 30.9 pg (ref 26.0–34.0)
MCHC: 32.2 g/dL (ref 30.0–36.0)
MCV: 95.8 fL (ref 80.0–100.0)
Platelets: 165 10*3/uL (ref 150–400)
RBC: 2.85 MIL/uL — ABNORMAL LOW (ref 3.87–5.11)
RDW: 13.6 % (ref 11.5–15.5)
WBC: 3.5 10*3/uL — ABNORMAL LOW (ref 4.0–10.5)
nRBC: 0 % (ref 0.0–0.2)

## 2020-04-17 LAB — PHOSPHORUS: Phosphorus: 3.5 mg/dL (ref 2.5–4.6)

## 2020-04-17 LAB — BASIC METABOLIC PANEL
Anion gap: 7 (ref 5–15)
BUN: 33 mg/dL — ABNORMAL HIGH (ref 6–20)
CO2: 21 mmol/L — ABNORMAL LOW (ref 22–32)
Calcium: 9.3 mg/dL (ref 8.9–10.3)
Chloride: 109 mmol/L (ref 98–111)
Creatinine, Ser: 1.27 mg/dL — ABNORMAL HIGH (ref 0.44–1.00)
GFR calc Af Amer: 53 mL/min — ABNORMAL LOW (ref >60)
GFR calc non Af Amer: 46 mL/min — ABNORMAL LOW (ref >60)
Glucose, Bld: 95 mg/dL (ref 70–99)
Potassium: 4.4 mmol/L (ref 3.5–5.1)
Sodium: 137 mmol/L (ref 135–145)

## 2020-04-17 LAB — GLUCOSE, CAPILLARY
Glucose-Capillary: 113 mg/dL — ABNORMAL HIGH (ref 70–99)
Glucose-Capillary: 119 mg/dL — ABNORMAL HIGH (ref 70–99)
Glucose-Capillary: 98 mg/dL (ref 70–99)
Glucose-Capillary: 79 mg/dL (ref 70–99)
Glucose-Capillary: 116 mg/dL — ABNORMAL HIGH (ref 70–99)

## 2020-04-17 LAB — MAGNESIUM: Magnesium: 2 mg/dL (ref 1.7–2.4)

## 2020-04-17 MED ORDER — TRAVASOL 10 % IV SOLN
INTRAVENOUS | Status: AC
  Filled 2020-04-17: qty 1000

## 2020-04-17 NOTE — Progress Notes (Signed)
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 Drucilla Schmidt.  Recent admission in April with sepsis with stenotrophomonas bacteremia transition to Bactrim x3 weeks until 04/06/2020, femoral PICC line replaced 4/14 by IR, MRCP showed dilated biliary duct therefore also had EGD/ERCP on 4/13, relatively unremarkable.  Subjective:  Feels better. Pain is better on current regimen. BM slowed down to 6 x. normally have 4-5 BM/day and 5-6 during night at home. No nausea and vomiting.  "I have tried everything at home" for pain and did not get better so had to come to hospital Reports she is in hospital every month either with line infection or gut problems.  Assessment & Plan:  Intractable nausea vomiting abdominal pain and diarrhea, in the setting of short gut syndrome/Crohn's disease: Pain likely from severe adhesive disease, CT abdomen did not show any acute finding.  Diarrhea likely chronic, C. difficile and GI panel was negative.  Seen by GI and signed off.  Currently on pain management IV Dilaudid, IV fluids nausea medication. Cont TPN, pain management w prn iv dilaudid 1-2 mg q3hr, Imodium and Lomotil and bentyl-Home regimen.  Overall feeling much improved today.At home on dilaudid 4 mg q6h and methadone.  Uncontrolled hypertension: Likely due to pain, continue pain management, continue amlodipine and metoprolol.   Metabolic acidosis bicarb low at 21, anion gap  normal.  Continue TPN.  Longstanding Crohn's disease status post multiple resection followed at [facility] and [facility].  Continue on symptomatic management as above.    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  Anemia of chronic disease hemoglobin low but stable.  Monitor.    Moderate protein calorie malnutrition, chronically on TPN since 2003: Dietitian on board, continue TPN per pharmacy.  Hypoglycemia: Resolved.    Dysuria: UA relatively stable with negative nitrite, trace leukocytes, WBC 0-5.  Hypokalemia-was repleted.  Chronic pain/depression on methadone 5 mg and Dilaudid.  On trazodone Wellbutrin and Cymbalta.  CKD stage IIIb baseline creatinine 1.6, STABLE.At risk of renal failure continue hydration.  GERD continue PPI and Carafate.  DVT prophylaxisHeparin Code Status: FULL Family Communication: plan of care discussed with patient at bedside.  Status is: Inpatient Remains inpatient appropriate because:IV treatments appropriate due to intensity of illness or inability to take PO, Inpatient level of care appropriate due to severity of illness and ongoing management of short gut syndrome with intractable pain, diarrhea  Dispo: The patient is from: Home              Anticipated d/c is to: Home              Anticipated d/c date is: 1 day Pt is not medically stable  Nutrition: Diet Order            Diet NPO time specified  Diet effective now              Consultants:see note  Procedures:see note Microbiology:see note  Medications: Scheduled Meds: . acidophilus   Oral  Daily  . amLODipine  5 mg Oral Daily  . budesonide  6 mg Oral Daily  . buPROPion  200 mg Oral Daily  . Chlorhexidine Gluconate Cloth  6 each Topical Daily  . cholecalciferol  2,000 Units Oral Daily  . cycloSPORINE  1 drop Both Eyes BID  . dicyclomine  20 mg Oral TID  . diphenoxylate-atropine  1 tablet Oral QID  . DULoxetine  90 mg Oral Daily  . famotidine  20 mg Oral QHS  . heparin   5,000 Units Subcutaneous Q8H  . insulin aspart  0-9 Units Subcutaneous 4 times per day  . lipase/protease/amylase  36,000 Units Oral TID with meals  . loperamide  2 mg Oral Q6H  . methadone  5 mg Oral 5 X Daily  . metoprolol tartrate  100 mg Oral BID  . multivitamin with minerals   Oral Daily  . omega-3 acid ethyl esters  1,000 mg Oral Daily  . pantoprazole  40 mg Oral Daily  . polyvinyl alcohol  1 drop Both Eyes QID  . sodium chloride flush  10-40 mL Intracatheter Q12H  . sucralfate  1 g Oral TID WC & HS  . vitamin B-12  1,000 mcg Oral Daily   Continuous Infusions: . dextrose 5 % and 0.9% NaCl Stopped (04/16/20 1748)  . TPN CYCLIC-ADULT (ION) 981 mL/hr at 04/16/20 1924  . TPN CYCLIC-ADULT (ION)      Antimicrobials: Anti-infectives (From admission, onward)   None       Objective: Vitals: Today's Vitals   04/17/20 0208 04/17/20 0501 04/17/20 0533 04/17/20 0600  BP:  (!) 163/69    Pulse:  64    Resp:  16    Temp:  98.3 F (36.8 C)    TempSrc:  Oral    SpO2:  100%    Weight:      Height:      PainSc: Asleep 9  0-No pain 0-No pain    Intake/Output Summary (Last 24 hours) at 04/17/2020 0902 Last data filed at 04/17/2020 0600 Gross per 24 hour  Intake 2258.17 ml  Output 0 ml  Net 2258.17 ml   Filed Weights   04/13/20 2035  Weight: 67.1 kg   Weight change:    Intake/Output from previous day: 05/22 0701 - 05/23 0700 In: 2258.2 [P.O.:120; I.V.:2138.2] Out: 0  Intake/Output this shift: No intake/output data recorded.  Examination:  General exam: AAO X3, NAD, weak appearing. HEENT:Oral mucosa moist, Ear/Nose WNL grossly, dentition normal. Respiratory system: bilaterally clear,no wheezing or crackles,no use of accessory muscle Cardiovascular system: S1 & S2 +, No JVD,. Gastrointestinal system: Abdomen soft, mild tenderness on lower abdomen, bowel sounds present, previous midline scar present.  Nervous System:Alert, awake, moving extremities and grossly  nonfocal Extremities: No edema, distal peripheral pulses palpable.  Skin: No rashes,no icterus. MSK: Normal muscle bulk,tone, power.  Data Reviewed: I have personally reviewed following labs and imaging studies CBC: Recent Labs  Lab 04/13/20 1920 04/14/20 1043 04/15/20 0436 04/16/20 0420 04/17/20 0325  WBC 4.8 3.6* 3.3* 3.8* 3.5*  NEUTROABS  --  1.3*  --   --   --   HGB 8.8* 8.7* 8.3* 8.6* 8.8*  HCT 26.8* 27.0* 25.8* 26.9* 27.3*  MCV 94.4 95.7 95.6 95.4 95.8  PLT 163 157 155 156 191   Basic Metabolic Panel: Recent Labs  Lab 04/13/20 1920 04/14/20 1043 04/15/20 0436 04/16/20 0420 04/17/20 0325  NA 139 138 139 137 137  K 3.3* 3.7 4.5 4.3 4.4  CL  101 105 108 107 109  CO2 27 25 26 24  21*  GLUCOSE 97 83 117* 125* 95  BUN 53* 41* 42* 33* 33*  CREATININE 1.74* 1.61* 1.49* 1.26* 1.27*  CALCIUM 9.4 8.8* 9.0 9.2 9.3  MG 2.2 2.1 2.1 2.0 2.0  PHOS  --  3.5  --   --  3.5   GFR: Estimated Creatinine Clearance: 48.1 mL/min (A) (by C-G formula based on SCr of 1.27 mg/dL (H)). Liver Function Tests: Recent Labs  Lab 04/13/20 1920 04/14/20 1043 04/15/20 0436  AST 18 28 22   ALT 27 31 27   ALKPHOS 112 110 114  BILITOT 0.7 0.8 0.7  PROT 7.0 6.4* 6.3*  ALBUMIN 3.4* 3.1* 3.1*   Recent Labs  Lab 04/13/20 1920  LIPASE 17   No results for input(s): AMMONIA in the last 168 hours. Coagulation Profile: No results for input(s): INR, PROTIME in the last 168 hours. Cardiac Enzymes: No results for input(s): CKTOTAL, CKMB, CKMBINDEX, TROPONINI in the last 168 hours. BNP (last 3 results) No results for input(s): PROBNP in the last 8760 hours. HbA1C: No results for input(s): HGBA1C in the last 72 hours. CBG: Recent Labs  Lab 04/16/20 1614 04/16/20 2030 04/16/20 2349 04/17/20 0454 04/17/20 0736  GLUCAP 80 92 124* 113* 119*   Lipid Profile: Recent Labs    04/14/20 1043  TRIG 94   Thyroid Function Tests: No results for input(s): TSH, T4TOTAL, FREET4, T3FREE, THYROIDAB in  the last 72 hours. Anemia Panel: No results for input(s): VITAMINB12, FOLATE, FERRITIN, TIBC, IRON, RETICCTPCT in the last 72 hours. Sepsis Labs: No results for input(s): PROCALCITON, LATICACIDVEN in the last 168 hours.  Recent Results (from the past 240 hour(s))  Gastrointestinal Panel by PCR , Stool     Status: None   Collection Time: 04/13/20  8:43 PM   Specimen: Rectum; Stool  Result Value Ref Range Status   Campylobacter species NOT DETECTED NOT DETECTED Final   Plesimonas shigelloides NOT DETECTED NOT DETECTED Final   Salmonella species NOT DETECTED NOT DETECTED Final   Yersinia enterocolitica NOT DETECTED NOT DETECTED Final   Vibrio species NOT DETECTED NOT DETECTED Final   Vibrio cholerae NOT DETECTED NOT DETECTED Final   Enteroaggregative E coli (EAEC) NOT DETECTED NOT DETECTED Final   Enteropathogenic E coli (EPEC) NOT DETECTED NOT DETECTED Final   Enterotoxigenic E coli (ETEC) NOT DETECTED NOT DETECTED Final   Shiga like toxin producing E coli (STEC) NOT DETECTED NOT DETECTED Final   Shigella/Enteroinvasive E coli (EIEC) NOT DETECTED NOT DETECTED Final   Cryptosporidium NOT DETECTED NOT DETECTED Final   Cyclospora cayetanensis NOT DETECTED NOT DETECTED Final   Entamoeba histolytica NOT DETECTED NOT DETECTED Final   Giardia lamblia NOT DETECTED NOT DETECTED Final   Adenovirus F40/41 NOT DETECTED NOT DETECTED Final   Astrovirus NOT DETECTED NOT DETECTED Final   Norovirus GI/GII NOT DETECTED NOT DETECTED Final   Rotavirus A NOT DETECTED NOT DETECTED Final   Sapovirus (I, II, IV, and V) NOT DETECTED NOT DETECTED Final    Comment: Performed at [facility], [address] [place], [place] [address]  C Difficile Quick Screen w PCR reflex     Status: None   Collection Time: 04/14/20  2:57 AM   Specimen: STOOL  Result Value Ref Range Status   C Diff antigen NEGATIVE NEGATIVE Final   C Diff toxin NEGATIVE NEGATIVE Final   C Diff interpretation No C. difficile  detected.  Final    Comment: Performed  at [facility], [place] [address] [place], [place] [address]  Radiology Studies: No results found.   LOS: 3 days   Antonieta Pert, MD [facility]  04/17/2020, 9:02 AM

## 2020-04-17 NOTE — Progress Notes (Signed)
PHARMACY - TOTAL PARENTERAL NUTRITION CONSULT NOTE   Indication: short gut syndrome, on TPN PTA  Patient Measurements:Height: 5' 8"  (172.7 cm) Weight: 67.1 kg (148 lb) IBW/kg (Calculated) : 63.9 TPN AdjBW (KG): 67.1 Usual Weight: 67.1 kg 04/13/2020 Ht: 68 inches BMI: 22.5  Assessment: 59 yo with PMH Chohn's disease, short gut syndrome on TPN, HTN, chronic pain, depression, CKD3b, GERD. Admitted 5/19 PM for persistent N/V - acute pancreatitis.  Pharmacy consulted to continue TPN as PTA.   Glucose / Insulin: no hx DM, glucose at goal <150 since TPN started, 0 unit of insulin administered in past 24h Electrolytes: WNL, stable  Renal: Scr 1.27 trended down, which appears to be her baseline LFTs / TGs: LFTs WNL, TG 94 on 5/20 Prealbumin / albumin: Prealbumin 26 on 5/20 Intake / Output; MIVF: D5NS @ 78m/hr; no output recorded GI Imaging: 5/19 CT abdomen: no acute findings Surgeries / Procedures:   Central access:  +PICC line TPN start date: Has been on TPN since 2003 for short gut syndrome  Nutritional Goals (per RD recommendation on 04/14/20): Kcal:  2000-2200 Protein:  100-115g Fluid:  2L/day  Current Nutrition: TPN  Home TPN per [employer] Previously: 2 Liter cycle over 16 hours protien 100 gm Dextrose 300 gm Lipids 40 gm  Plan:   Continue home cyclic TPN over 189Q(from 6p-->10a daily)  Electrolytes in TPN: 568m/L of Na, 5067mL of K, 3.5mE52m of Ca, 5mEq80mof Mg, and 10mmo75mof Phos. Cl:Ac ratio 1:1  Add trace elements to TPN.  Oral MVI daily.   Check CBGs and cover with sensitive SSI per cyclic TPN protocol (1h after TPN started, 2h after TPN stopped, once while TPN infusing & once while off TPN)   D5NS @ 30ml/h70m>Hold MIVF while TPN infusing.    BMP, magnesium, phosphorus with AM labs   Monitor TPN labs on Mon/Thurs   Courtne Lighty Royetta AsalD, BCPS 04/17/2020 8:21 AM

## 2020-04-18 ENCOUNTER — Other Ambulatory Visit: Payer: Self-pay

## 2020-04-18 LAB — CBC
HCT: 28.6 % — ABNORMAL LOW (ref 36.0–46.0)
Hemoglobin: 9 g/dL — ABNORMAL LOW (ref 12.0–15.0)
MCH: 30.3 pg (ref 26.0–34.0)
MCHC: 31.5 g/dL (ref 30.0–36.0)
MCV: 96.3 fL (ref 80.0–100.0)
Platelets: 177 10*3/uL (ref 150–400)
RBC: 2.97 MIL/uL — ABNORMAL LOW (ref 3.87–5.11)
RDW: 13.4 % (ref 11.5–15.5)
WBC: 3.4 10*3/uL — ABNORMAL LOW (ref 4.0–10.5)
nRBC: 0 % (ref 0.0–0.2)

## 2020-04-18 LAB — COMPREHENSIVE METABOLIC PANEL
ALT: 33 U/L (ref 0–44)
AST: 31 U/L (ref 15–41)
Albumin: 3.2 g/dL — ABNORMAL LOW (ref 3.5–5.0)
Alkaline Phosphatase: 140 U/L — ABNORMAL HIGH (ref 38–126)
Anion gap: 8 (ref 5–15)
BUN: 37 mg/dL — ABNORMAL HIGH (ref 6–20)
CO2: 19 mmol/L — ABNORMAL LOW (ref 22–32)
Calcium: 9.2 mg/dL (ref 8.9–10.3)
Chloride: 109 mmol/L (ref 98–111)
Creatinine, Ser: 1.26 mg/dL — ABNORMAL HIGH (ref 0.44–1.00)
GFR calc Af Amer: 54 mL/min — ABNORMAL LOW (ref >60)
GFR calc non Af Amer: 47 mL/min — ABNORMAL LOW (ref >60)
Glucose, Bld: 100 mg/dL — ABNORMAL HIGH (ref 70–99)
Potassium: 4.7 mmol/L (ref 3.5–5.1)
Sodium: 136 mmol/L (ref 135–145)
Total Bilirubin: 0.6 mg/dL (ref 0.3–1.2)
Total Protein: 6.9 g/dL (ref 6.5–8.1)

## 2020-04-18 LAB — DIFFERENTIAL
Abs Immature Granulocytes: 0 10*3/uL (ref 0.00–0.07)
Basophils Absolute: 0 10*3/uL (ref 0.0–0.1)
Basophils Relative: 0 %
Eosinophils Absolute: 0.2 10*3/uL (ref 0.0–0.5)
Eosinophils Relative: 6 %
Immature Granulocytes: 0 %
Lymphocytes Relative: 39 %
Lymphs Abs: 1.3 10*3/uL (ref 0.7–4.0)
Monocytes Absolute: 0.2 10*3/uL (ref 0.1–1.0)
Monocytes Relative: 6 %
Neutro Abs: 1.7 10*3/uL (ref 1.7–7.7)
Neutrophils Relative %: 49 %

## 2020-04-18 LAB — PREALBUMIN: Prealbumin: 24.1 mg/dL (ref 18–38)

## 2020-04-18 LAB — GLUCOSE, CAPILLARY
Glucose-Capillary: 113 mg/dL — ABNORMAL HIGH (ref 70–99)
Glucose-Capillary: 113 mg/dL — ABNORMAL HIGH (ref 70–99)
Glucose-Capillary: 125 mg/dL — ABNORMAL HIGH (ref 70–99)
Glucose-Capillary: 75 mg/dL (ref 70–99)
Glucose-Capillary: 85 mg/dL (ref 70–99)

## 2020-04-18 LAB — TRIGLYCERIDES: Triglycerides: 58 mg/dL (ref <150)

## 2020-04-18 LAB — MAGNESIUM: Magnesium: 2 mg/dL (ref 1.7–2.4)

## 2020-04-18 LAB — PHOSPHORUS: Phosphorus: 4 mg/dL (ref 2.5–4.6)

## 2020-04-18 MED ORDER — TRAVASOL 10 % IV SOLN
INTRAVENOUS | Status: DC
  Filled 2020-04-18: qty 1000

## 2020-04-18 NOTE — TOC Initial Note (Addendum)
Transition of Care [facility]) - Initial/Assessment Note    Patient Details  Name: Caitlin Peters MRN: [id number] Date of Birth: 1961-05-02  Transition of Care [facility]) CM/SW Contact:    Lia Hopping, [place] Phone Number: 04/18/2020, 3:10 PM  Clinical Narrative:                 Patient notified CSW she is active with [facility] for RN services. Agency following patient and will resume RN care at discharge. Orders placed by physician. Patient is knowledgeable of TPN services. Patient has DME, declines any physical  therapy services.  No other needs identified CSW will sign off.   Expected Discharge Plan: [place] Barriers to Discharge: No Barriers Identified   Patient Goals and CMS Choice   CMS Medicare.gov Compare Post Acute Care list provided to:: (Active with [facility]/Will resume care) Choice offered to / list presented to : NA  Expected Discharge Plan and Services Expected Discharge Plan: [place] Choice: [place] arrangements for the past 2 months: Apartment Expected Discharge Date: 04/18/20                         HH Arranged: RN [place] Agency: [place] Date [place]: 04/18/20 Time [place]: 1508 Representative spoke with at [place]: [place] Arrangements/Services Living arrangements for the past 2 months: [place] with:: Self   Do you feel safe going back to the place where you live?: Yes      Need for Family Participation in Patient Care: Yes (Comment) Care giver support system in place?: Yes (comment) Current home services: DME, Home RN Criminal Activity/Legal Involvement Pertinent to Current Situation/Hospitalization: No - Comment as needed  Activities of Daily Living Home Assistive Devices/Equipment: Environmental consultant (specify type) ADL Screening (condition at time of admission) Patient's cognitive ability adequate to safely  complete daily activities?: Yes Is the patient deaf or have difficulty hearing?: No Does the patient have difficulty seeing, even when wearing glasses/contacts?: No Does the patient have difficulty concentrating, remembering, or making decisions?: No Patient able to express need for assistance with ADLs?: Yes Does the patient have difficulty dressing or bathing?: No Independently performs ADLs?: Yes (appropriate for developmental age) Does the patient have difficulty walking or climbing stairs?: Yes Weakness of Legs: Both Weakness of Arms/Hands: Both  Permission Sought/Granted   Permission granted to share information with : Yes, Verbal Permission Granted              Emotional Assessment Appearance:: Appears stated age Attitude/Demeanor/Rapport: Engaged Affect (typically observed): Accepting Orientation: : Oriented to Self, Oriented to Place, Oriented to  Time, Oriented to Situation Alcohol / Substance Use: Not Applicable Psych Involvement: No (comment)  Admission diagnosis:  Intractable nausea and vomiting [R11.2] Patient Active Problem List   Diagnosis Date Noted  . Acute pancreatitis 04/13/2020  . Intractable nausea and vomiting 04/13/2020  . Infection due to Stenotrophomonas maltophilia 03/10/2020  . Sepsis ([place]) 03/05/2020  . Abnormal LFTs 03/05/2020  . Pancytopenia ([place]) 03/05/2020  . Stage 3b chronic kidney disease 03/05/2020  . Bacteremia   . Infection of peripherally inserted central venous catheter (PICC)   . On total parenteral nutrition (TPN) 01/02/2020  . Elevated liver enzymes 01/02/2020  . Cholangitis   . Generalized abdominal pain   . Intractable abdominal pain 11/25/2019  . Anasarca 10/28/2019  .  Acute kidney injury superimposed on chronic kidney disease ([place]) 10/28/2019  . Falls 10/28/2019  . Crohn disease ([place])   . Malnutrition ([place])   . Short gut syndrome   . HTN (hypertension)   . Vitamin B12 deficiency   . Osteoporosis   . Depression   .  GERD (gastroesophageal reflux disease)   . Chronic pain syndrome   . Hypokalemia due to excessive gastrointestinal loss of potassium 10/13/2019  . Diarrhea   . Fever   . Hypokalemia 10/12/2019  . Chronic diarrhea 10/12/2019  . Dysuria 10/12/2019  . Bilateral lower extremity edema 10/12/2019  . Crohn's colitis ([place]) 10/12/2019  . Anemia 10/12/2019  . Hypertension 10/12/2019  . AKI (acute kidney injury) ([place]) 10/12/2019   PCP:  [name], [name], MD Pharmacy:   [facility], [place] [place]. [place] [address] Phone: [phone] Fax: [phone]     Social Determinants of Health (SDOH) Interventions    Readmission Risk Interventions Readmission Risk Prevention Plan 11/30/2019 10/30/2019  Transportation Screening Complete Complete  PCP or Specialist Appt within 3-5 Days - Not Complete  Not Complete comments - pending d/c date to arrange  [place] or [place] - Complete  Social Work Consult for [place] Planning/Counseling - Not Complete  SW consult not completed comments - RNCM met with pt  Palliative Care Screening - Not Applicable  Medication Review Press photographer) Complete Complete  PCP or Specialist appointment within 3-5 days of discharge Complete -  [place] or [place] Complete -  SW Recovery Care/Counseling Consult Complete -  Palliative Care Screening Not Applicable -  [place] Not Applicable -

## 2020-04-18 NOTE — Progress Notes (Signed)
PHARMACY - TOTAL PARENTERAL NUTRITION CONSULT NOTE   Indication: short gut syndrome, on TPN PTA  Patient Measurements Height: 5' 8"  (172.7 cm) Weight: 67.1 kg (148 lb) IBW/kg (Calculated) : 63.9 TPN AdjBW (KG): 67.1  Assessment: 59 yo with PMH Crohn's disease, short gut syndrome on TPN, HTN, chronic pain, depression, CKD3b, GERD. Admitted 5/19 PM for persistent N/V - acute pancreatitis. Pharmacy consulted to continue TPN as PTA.   Glucose / Insulin: no hx DM, CBGs at goal < 150. 0 units of insulin administered in past 24h.  Electrolytes: CO2 low. All others WNL.   Renal: SCr 1.26, stable, appears to be her baseline LFTs / TGs: AST/ALT, Tbili WNL, Alk Phos elevated/increased to 140. TG 94 (5/20), 58 (5/24) Prealbumin / albumin: Prealbumin 26 (5/20), 24.1 (5/24); Albumin 3.2  Intake / Output; MIVF: D5NS @ 7m/hr while TPN off; multiple urine and stool occurrences, but not measured  GI Imaging: 5/19 CT abdomen pelvis: no acute findings Surgeries / Procedures:   Central access: PICC line TPN start date: Has been on TPN since 2003 for short gut syndrome  Nutritional Goals (per RD recommendation on 04/14/20): Kcal:  2000-2200 Protein:  100-115g Fluid:  2L/day  Current Nutrition: TPN  Home TPN per [employer] as below:  2 Liter cycle over 16 hours Protein 100g, Dextrose 300g, Lipids 40g  Plan:   Continue home cyclic TPN over 100K(from 6pm --> 10am daily)  Electrolytes in TPN: 565m/L of Na, 5022mL of K, 3.5mE72m of Ca, 5mEq63mof Mag, and 10mmo65mof Phos. Cl:Ac ratio to 1:2  Add trace elements to TPN.  Add MVI to TPN tonight also, as patient refused PO MVI this morning.    Check CBGs and cover with sensitive SSI per cyclic TPN protocol (1h after TPN started, 2h after TPN stopped, once while TPN infusing & once while off TPN)   D5NS @ 30ml/h62m> Hold MIVF while TPN infusing.    BMET, Mag, Phos in AM  Monitor TPN labs on Mon/Thurs   Cristal Qadir GLindell SparD, BCPS Clinical  Pharmacist  04/18/2020 10:26 AM

## 2020-04-18 NOTE — Discharge Summary (Signed)
Physician Discharge Summary  Caitlin Peters BSW:[id number] DOB: Jun 12, 1961 DOA: 04/13/2020  PCP: [name], [name], MD  Admit date: 04/13/2020 Discharge date: 04/18/2020  Admitted From: home Disposition:  home  Recommendations for Outpatient Follow-up:  1. Follow up with PCP and with your pain management clinic and GI doctor in 1 week 2. Please obtain BMP/CBC in one week 3. 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 Drucilla Schmidt.  Recent admission in April with sepsis with stenotrophomonas bacteremia transition to Bactrim x3 weeks until 04/06/2020, femoral PICC line replaced 4/14 by IR, MRCP showed dilated biliary duct therefore also had EGD/ERCP on 4/13, relatively unremarkable. Patient was admitted managed conservatively.  CT abdomen no acute finding.  C. difficile and GI panel negative. She was resumed on her TPN, continue on pain management with IV and oral opiates and antispasmodic and antidiarrheal medication. Patient has slowly improved.At this time pain is controlled. She feels well enough to go home today and she is going to follow-up with her pain management at [facility]   Discharge Diagnoses:  Assessment & Plan:  Intractable nausea vomiting abdominal pain and diarrhea in the setting of short gut syndrome  Crohn's disease pain likely from severe adhesive disease from Crohn's.   CT abdomen no acute finding.  C. difficile and GI panel negative. She was resumed on her TPN, at this time patient medically stable.  She will continue on her home pain management along with antispasmodic and antidiarrheal medication.  She will continue her home budesonide capsule.  Uncontrolled hypertension: Continue home medication with amlodipine metoprolol.  Continue pain control.  Blood pressure is acceptable.  Metabolic acidosis mild, continue hydration with TPN.    Longstanding Crohn's disease , history of multiple resection followed at [facility].  Continue plan as per #1.  Follow-up with her GI doctor.  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  Anemia of chronic disease hemoglobin low but stable.  Monitor intermittently.    Moderate protein calorie malnutrition on TPN since 2003, dietitian on board appreciate input.  Continue TPN.    Hypoglycemia: Resolved.    Dysuria: UA relatively stable with negative nitrite, trace leukocytes, WBC 0-5.  Hypokalemia-was repleted.  Chronic pain/depression on methadone 5 mg and Dilaudid.  On trazodone Wellbutrin and Cymbalta.  CKD stage IIIb baseline creatinine 1.6, improved to 1.2 likely had some AKI.  Continue TPN.    GERD continue PPI and Carafate. Consults:  gi  Subjective: Resting well pain overall stable, but not has significantly improved.  She feels well and would like to go home today.  Discharge Exam: Vitals:   04/18/20 0612 04/18/20 1322  BP: (!) 167/68 (!) 147/59  Pulse: 63 65  Resp: 17 16  Temp: 99.2  F (37.3 C) 98.8 F (37.1 C)  SpO2: 100% 100%   General: Pt is alert, awake, not in acute distress Cardiovascular: RRR, S1/S2 +, no rubs, no gallops Respiratory: CTA bilaterally, no wheezing, no rhonchi Abdominal: Soft, mildly tender in lower abdomen with surgical scar, nondistended no guarding guarding or rigidity, BS+   Extremities: no edema, no cyanosis  Discharge Instructions  Discharge Instructions    Discharge instructions   Complete by: As directed    Please follow-up with your pain management doctor and your GI doctor in 4 to 5 days, get CBC BMP checked in 4 to 5 days.  Please call call MD or return to ER for similar or worsening recurring problem that brought you to hospital or if any fever,nausea/vomiting,abdominal pain, uncontrolled pain, chest pain,  shortness of breath or any other alarming symptoms.  Please follow-up your doctor as instructed in a week time and call the office for appointment.  Please avoid alcohol, smoking, or any other illicit substance and maintain healthy habits including taking your regular medications as prescribed.  You were cared for by a hospitalist during your hospital stay. If you have any questions about your discharge medications or the care you received while you were in the hospital after you are discharged, you can call the unit and ask to speak with the hospitalist on call if the hospitalist that took care of you is not available.  Once you are discharged, your primary care physician will handle any further medical issues. Please note that NO REFILLS for any discharge medications will be authorized once you are discharged, as it is imperative that you return to your primary care physician (or establish a relationship with a primary care physician if you do not have one) for your aftercare needs so that they can reassess your need for medications and monitor your lab values   Increase activity slowly   Complete by: As directed      Allergies as of 04/18/2020      Reactions   Cefepime Other (See Comments)   Neurotoxicity occurring in setting of AKI. Ceftriaxone tolerated during same admit   Gabapentin Other (See Comments)   unknown   Hyoscyamine Hives, Swelling   Legs swelling Disorientation   Lyrica [pregabalin] Other (See Comments)   unknown    Meperidine Hives   Other reaction(s): GI Upset Due to Chrones   Topamax [topiramate] Other (See Comments)   unknown   Fentanyl Rash   Pt is allergic to fentanyl patch related to the glue (gives her a rash) Pt states she is NOT allergic to fentanyl IV medicine   Morphine And Related Rash      Medication List    TAKE these medications   acetaminophen 325 MG tablet Commonly known as: TYLENOL Take 650 mg by mouth every 6 (six) hours as needed for mild pain.   amLODipine 5 MG tablet Commonly known as: NORVASC Take 1 tablet (5 mg total) by mouth daily.   budesonide 3 MG 24 hr capsule Commonly known as: ENTOCORT EC TAKE 3 CAPSULES BY MOUTH ONCE DAILY What changed: how much to take   Wellbutrin SR 100 MG 12 hr tablet Generic drug: buPROPion Take 200 mg by mouth daily.   buPROPion 100 MG 12 hr tablet Commonly known as: WELLBUTRIN SR Take 2 tablets (200 mg total) by mouth daily.   cholecalciferol 25 MCG (1000 UNIT) tablet Commonly known as: VITAMIN D3 Take 2,000 Units by mouth daily.   Creon 36000 UNITS Cpep  capsule Generic drug: lipase/protease/amylase Take 36,000 Units by mouth 3 (three) times daily as needed (with meals).   cycloSPORINE 0.05 % ophthalmic emulsion Commonly known as: RESTASIS Place 1 drop into both eyes 2 (two) times daily.   Dexilant 60 MG capsule Generic drug: dexlansoprazole Take 1 capsule (60 mg total) by mouth daily.   Dextran 70-Hypromellose 0.1-0.3 % Soln Place 1 drop into both eyes 4 (four) times daily.   dicyclomine 20 MG tablet Commonly known as: BENTYL Take 20 mg by mouth 3 (three) times daily.   diphenoxylate-atropine 2.5-0.025 MG tablet Commonly known as: LOMOTIL Take 1 tablet by mouth 4 (four) times daily as needed for diarrhea or loose stools.   DULoxetine 30 MG capsule Commonly known as: CYMBALTA Take 3 capsules (90 mg total) by mouth daily.   DULoxetine 60 MG capsule Commonly known as: CYMBALTA Take 60 mg by mouth daily.    estradiol 2 MG tablet Commonly known as: ESTRACE Take 1 tablet (2 mg total) by mouth daily.   famotidine 20 MG tablet Commonly known as: PEPCID Take 20 mg by mouth at bedtime.   HYDROmorphone 4 MG tablet Commonly known as: Dilaudid Take 1 tablet (4 mg total) by mouth every 6 (six) hours as needed for severe pain.   loperamide 2 MG capsule Commonly known as: IMODIUM TAKE 1 CAPSULE BY MOUTH   AS NEEDED FOR DIARRHEA OR  LOOSE  STOOLS What changed: See the new instructions.   methadone 10 MG tablet Commonly known as: DOLOPHINE Take 1 tablet (10 mg total) by mouth every 8 (eight) hours. What changed:   how much to take  when to take this  additional instructions   methadone 5 MG tablet Commonly known as: DOLOPHINE Take 5 mg by mouth 5 (five) times daily. What changed: Another medication with the same name was changed. Make sure you understand how and when to take each.   metoprolol tartrate 100 MG tablet Commonly known as: LOPRESSOR Take 1 tablet (100 mg total) by mouth 2 (two) times daily.   MULTIVITAMIN ADULT PO Take 1 tablet by mouth daily.   Omega-3 1000 MG Caps Take 1 capsule by mouth daily.   potassium chloride SA 20 MEQ tablet Commonly known as: KLOR-CON Take 40 mEq by mouth daily.   PRESCRIPTION MEDICATION Home TPN [facility] [place]   PROBIOTIC-10 PO Take 1 capsule by mouth daily.   promethazine 25 MG tablet Commonly known as: PHENERGAN TAKE 1 TABLET BY MOUTH EVERY 6 HOURS AS NEEDED FOR NAUSEA AND VOMITING What changed: See the new instructions.   sucralfate 1 GM/10ML suspension Commonly known as: Carafate Take 10 mLs (1 g total) by mouth 4 (four) times daily -  with meals and at bedtime.   traZODone 100 MG tablet Commonly known as: DESYREL Take 2 tablets (200 mg total) by mouth at bedtime as needed for sleep.   vitamin B-12 100 MCG tablet Commonly known as: CYANOCOBALAMIN Take 1,000 mcg by mouth daily.       Allergies   Allergen Reactions  . Cefepime Other (See Comments)    Neurotoxicity occurring in setting of AKI. Ceftriaxone tolerated during same admit  . Gabapentin Other (See Comments)    unknown  . Hyoscyamine Hives and Swelling    Legs swelling   Disorientation  . Lyrica [Pregabalin] Other (See Comments)    unknown  . Meperidine Hives    Other reaction(s): GI Upset Due to Chrones   . Topamax [Topiramate] Other (See Comments)  unknown  . Fentanyl Rash    Pt is allergic to fentanyl patch related to the glue (gives her a rash) Pt states she is NOT allergic to fentanyl IV medicine  . Morphine And Related Rash    The results of significant diagnostics from this hospitalization (including imaging, microbiology, ancillary and laboratory) are listed below for reference.    Microbiology: Recent Results (from the past 240 hour(s))  Gastrointestinal Panel by PCR , Stool     Status: None   Collection Time: 04/13/20  8:43 PM   Specimen: Rectum; Stool  Result Value Ref Range Status   Campylobacter species NOT DETECTED NOT DETECTED Final   Plesimonas shigelloides NOT DETECTED NOT DETECTED Final   Salmonella species NOT DETECTED NOT DETECTED Final   Yersinia enterocolitica NOT DETECTED NOT DETECTED Final   Vibrio species NOT DETECTED NOT DETECTED Final   Vibrio cholerae NOT DETECTED NOT DETECTED Final   Enteroaggregative E coli (EAEC) NOT DETECTED NOT DETECTED Final   Enteropathogenic E coli (EPEC) NOT DETECTED NOT DETECTED Final   Enterotoxigenic E coli (ETEC) NOT DETECTED NOT DETECTED Final   Shiga like toxin producing E coli (STEC) NOT DETECTED NOT DETECTED Final   Shigella/Enteroinvasive E coli (EIEC) NOT DETECTED NOT DETECTED Final   Cryptosporidium NOT DETECTED NOT DETECTED Final   Cyclospora cayetanensis NOT DETECTED NOT DETECTED Final   Entamoeba histolytica NOT DETECTED NOT DETECTED Final   Giardia lamblia NOT DETECTED NOT DETECTED Final   Adenovirus F40/41 NOT DETECTED NOT DETECTED  Final   Astrovirus NOT DETECTED NOT DETECTED Final   Norovirus GI/GII NOT DETECTED NOT DETECTED Final   Rotavirus A NOT DETECTED NOT DETECTED Final   Sapovirus (I, II, IV, and V) NOT DETECTED NOT DETECTED Final    Comment: Performed at [facility], [address] [place], [place] [address]  C Difficile Quick Screen w PCR reflex     Status: None   Collection Time: 04/14/20  2:57 AM   Specimen: STOOL  Result Value Ref Range Status   C Diff antigen NEGATIVE NEGATIVE Final   C Diff toxin NEGATIVE NEGATIVE Final   C Diff interpretation No C. difficile detected.  Final    Comment: Performed at [facility], [place] [address], [place], [place] [address]    Procedures/Studies: CT ABDOMEN PELVIS WO CONTRAST  Result Date: 04/13/2020 CLINICAL DATA:  Nausea and vomiting. Patient has history of cholecystectomy, appendectomy, and previous colon surgery for Crohn's disease. EXAM: CT ABDOMEN AND PELVIS WITHOUT CONTRAST TECHNIQUE: Multidetector CT imaging of the abdomen and pelvis was performed following the standard protocol without IV contrast. COMPARISON:  CT 03/05/2020, MRCP 03/07/2020 FINDINGS: Lower chest: The lung bases are clear. Hepatobiliary: Minimal pneumobilia in the left lobe likely related to prior ERCP. No focal hepatic lesion. Prior cholecystectomy. No biliary dilatation on noncontrast exam. Pancreas: Proximal pancreatic ductal dilatation of 5 mm which appears similar to prior exam. There is suggestion of retained contrast in the pancreatic duct from prior ERCP. No peripancreatic fat stranding. No peripancreatic fluid collection. Spleen: Normal in size without focal abnormality. Adrenals/Urinary Tract: No adrenal nodule. No hydronephrosis or perinephric edema. No renal stone. Urinary bladder is decompressed. Stomach/Bowel: Stomach is unremarkable. Duodenum approaches the midline with small-bowel than courses in the right abdomen, unchanged. There is no evidence of bowel  obstruction or inflammation. Prior right hemicolectomy. Additional chain sutures within small bowel. Enteric contrast reaches the ileocolic junction. Mild gaseous distension of transverse, descending, and to a lesser extent sigmoid colon  is also seen on prior exam. Scattered liquid stool in the colon. No colonic wall thickening or inflammation. Vascular/Lymphatic: Left common femoral venous catheter tip is in the IVC. Mild aortic atherosclerosis. No bulky adenopathy, evaluation for enlarged lymph nodes is limited in the absence of IV contrast and paucity of intra-abdominal fat. Reproductive: Status post hysterectomy. No adnexal masses. Other: No ascites, free air, or significant inflammatory change. No body wall hernia. Musculoskeletal: There are no acute or suspicious osseous abnormalities. Chronic avascular necrosis of the left femoral head. Chronic bone island in the left pelvis. IMPRESSION: 1. No acute findings in the abdomen/pelvis. 2. Unchanged pancreatic ductal dilatation without evidence of pancreatitis/pancreatic inflammation. 3. No acute bowel inflammation. Mild chronic colonic distension, unchanged from prior exam. No acute inflammation. Aortic Atherosclerosis (ICD10-I70.0). Electronically Signed   By: Keith Rake M.D.   On: 04/13/2020 21:37    Labs: BNP (last 3 results) Recent Labs    10/12/19 2059 10/28/19 0435 11/26/19 0209  BNP 253.9* 162.4* 466.5*   Basic Metabolic Panel: Recent Labs  Lab 04/14/20 1043 04/15/20 0436 04/16/20 0420 04/17/20 0325 04/18/20 0340  NA 138 139 137 137 136  K 3.7 4.5 4.3 4.4 4.7  CL 105 108 107 109 109  CO2 25 26 24  21* 19*  GLUCOSE 83 117* 125* 95 100*  BUN 41* 42* 33* 33* 37*  CREATININE 1.61* 1.49* 1.26* 1.27* 1.26*  CALCIUM 8.8* 9.0 9.2 9.3 9.2  MG 2.1 2.1 2.0 2.0 2.0  PHOS 3.5  --   --  3.5 4.0   Liver Function Tests: Recent Labs  Lab 04/13/20 1920 04/14/20 1043 04/15/20 0436 04/18/20 0340  AST 18 28 22 31   ALT 27 31 27  33   ALKPHOS 112 110 114 140*  BILITOT 0.7 0.8 0.7 0.6  PROT 7.0 6.4* 6.3* 6.9  ALBUMIN 3.4* 3.1* 3.1* 3.2*   Recent Labs  Lab 04/13/20 1920  LIPASE 17   No results for input(s): AMMONIA in the last 168 hours. CBC: Recent Labs  Lab 04/14/20 1043 04/15/20 0436 04/16/20 0420 04/17/20 0325 04/18/20 0340  WBC 3.6* 3.3* 3.8* 3.5* 3.4*  NEUTROABS 1.3*  --   --   --  1.7  HGB 8.7* 8.3* 8.6* 8.8* 9.0*  HCT 27.0* 25.8* 26.9* 27.3* 28.6*  MCV 95.7 95.6 95.4 95.8 96.3  PLT 157 155 156 165 177   Cardiac Enzymes: No results for input(s): CKTOTAL, CKMB, CKMBINDEX, TROPONINI in the last 168 hours. BNP: Invalid input(s): POCBNP CBG: Recent Labs  Lab 04/17/20 2016 04/18/20 0057 04/18/20 0333 04/18/20 0733 04/18/20 1213  GLUCAP 116* 113* 113* 125* 85   D-Dimer No results for input(s): DDIMER in the last 72 hours. Hgb A1c No results for input(s): HGBA1C in the last 72 hours. Lipid Profile Recent Labs    04/18/20 0340  TRIG 58   Thyroid function studies No results for input(s): TSH, T4TOTAL, T3FREE, THYROIDAB in the last 72 hours.  Invalid input(s): FREET3 Anemia work up No results for input(s): VITAMINB12, FOLATE, FERRITIN, TIBC, IRON, RETICCTPCT in the last 72 hours. Urinalysis    Component Value Date/Time   COLORURINE YELLOW 04/14/2020 0257   APPEARANCEUR CLEAR 04/14/2020 0257   LABSPEC 1.006 04/14/2020 0257   PHURINE 6.0 04/14/2020 0257   GLUCOSEU NEGATIVE 04/14/2020 0257   HGBUR NEGATIVE 04/14/2020 0257   Hyampom 04/14/2020 [place] 04/14/2020 0257   PROTEINUR NEGATIVE 04/14/2020 0257   NITRITE NEGATIVE 04/14/2020 [place] (A) 04/14/2020 0257  Sepsis Labs Invalid input(s): PROCALCITONIN,  WBC,  LACTICIDVEN Microbiology Recent Results (from the past 240 hour(s))  Gastrointestinal Panel by PCR , Stool     Status: None   Collection Time: 04/13/20  8:43 PM   Specimen: Rectum; Stool  Result Value Ref Range Status    Campylobacter species NOT DETECTED NOT DETECTED Final   Plesimonas shigelloides NOT DETECTED NOT DETECTED Final   Salmonella species NOT DETECTED NOT DETECTED Final   Yersinia enterocolitica NOT DETECTED NOT DETECTED Final   Vibrio species NOT DETECTED NOT DETECTED Final   Vibrio cholerae NOT DETECTED NOT DETECTED Final   Enteroaggregative E coli (EAEC) NOT DETECTED NOT DETECTED Final   Enteropathogenic E coli (EPEC) NOT DETECTED NOT DETECTED Final   Enterotoxigenic E coli (ETEC) NOT DETECTED NOT DETECTED Final   Shiga like toxin producing E coli (STEC) NOT DETECTED NOT DETECTED Final   Shigella/Enteroinvasive E coli (EIEC) NOT DETECTED NOT DETECTED Final   Cryptosporidium NOT DETECTED NOT DETECTED Final   Cyclospora cayetanensis NOT DETECTED NOT DETECTED Final   Entamoeba histolytica NOT DETECTED NOT DETECTED Final   Giardia lamblia NOT DETECTED NOT DETECTED Final   Adenovirus F40/41 NOT DETECTED NOT DETECTED Final   Astrovirus NOT DETECTED NOT DETECTED Final   Norovirus GI/GII NOT DETECTED NOT DETECTED Final   Rotavirus A NOT DETECTED NOT DETECTED Final   Sapovirus (I, II, IV, and V) NOT DETECTED NOT DETECTED Final    Comment: Performed at [facility], [address] [place], [place] [address]  C Difficile Quick Screen w PCR reflex     Status: None   Collection Time: 04/14/20  2:57 AM   Specimen: STOOL  Result Value Ref Range Status   C Diff antigen NEGATIVE NEGATIVE Final   C Diff toxin NEGATIVE NEGATIVE Final   C Diff interpretation No C. difficile detected.  Final    Comment: Performed at [facility], [place] [address], [place], [place] [address]     Time coordinating discharge: 25 minutes  SIGNED: Antonieta Pert, MD  [facility] 04/18/2020, 1:58 PM  If 7PM-7AM, please contact night-coverage [email or web address]

## 2020-04-18 NOTE — Progress Notes (Signed)
Discharge instructions given to pt and all questions were answered.  

## 2020-04-19 ENCOUNTER — Telehealth: Payer: Self-pay | Admitting: *Deleted

## 2020-04-19 ENCOUNTER — Ambulatory Visit (INDEPENDENT_AMBULATORY_CARE_PROVIDER_SITE_OTHER): Payer: Medicare Other | Admitting: Internal Medicine

## 2020-04-19 ENCOUNTER — Encounter: Payer: Self-pay | Admitting: Internal Medicine

## 2020-04-19 VITALS — BP 120/78 | Temp 97.2°F | Wt 144.1 lb

## 2020-04-19 DIAGNOSIS — N1832 Chronic kidney disease, stage 3b: Secondary | ICD-10-CM | POA: Diagnosis not present

## 2020-04-19 DIAGNOSIS — R112 Nausea with vomiting, unspecified: Secondary | ICD-10-CM | POA: Diagnosis not present

## 2020-04-19 DIAGNOSIS — E43 Unspecified severe protein-calorie malnutrition: Secondary | ICD-10-CM | POA: Diagnosis not present

## 2020-04-19 DIAGNOSIS — Z09 Encounter for follow-up examination after completed treatment for conditions other than malignant neoplasm: Secondary | ICD-10-CM

## 2020-04-19 DIAGNOSIS — K50819 Crohn's disease of both small and large intestine with unspecified complications: Secondary | ICD-10-CM

## 2020-04-19 DIAGNOSIS — I10 Essential (primary) hypertension: Secondary | ICD-10-CM

## 2020-04-19 DIAGNOSIS — G894 Chronic pain syndrome: Secondary | ICD-10-CM

## 2020-04-19 NOTE — Progress Notes (Signed)
Hospital follow-up visit     CC/Reason for Visit: Hospitalization follow-up  HPI: Caitlin Peters is a 59 y.o. female who is coming in today for the above mentioned reasons, specifically transitional care services face-to-face visit.    Dates hospitalized: 04/13/2020-04/18/2020 Days since discharge from hospital: 1 Patient was discharged from the hospital to: Home Reason for admission to hospital: Intractable nausea, vomiting, diarrhea Date of interactive phone contact with patient and/or caregiver: 04/19/2020  I have reviewed in detail patient's discharge summary plus pertinent specific notes, labs, and images from the hospitalization.  Patient was advised to go to the emergency department straight from ID appointment due to severe abdominal pain, nausea, vomiting.  She has a history of Crohn's disease with multiple bowel resections and short gut syndrome with chronic pain followed by pain management clinic and GI at [facility]  She has had multiple PICC line infections and bacteremia most recently in April, she has completed a course of Bactrim for this.  Patient was managed conservatively.  CT scan of the abdomen with no acute finding, C. difficile and GI panel were negative.  She was resumed on her TPN, she was continued on pain management and antispasmodics and improved and was discharged home.  It has been less than 24 hours since her hospital discharge.  She states she has some chronic low-level abdominal pain but is feeling well today.  There were some concerns with elevated blood pressures during hospitalization but her blood pressure today is normal at 120/78.  She also has subacute on chronic stage III kidney disease but her creatinine had gone down to her baseline of 1.2 on hospital discharge.  Medication reconciliation was done today and patient is taking meds as recommended by discharging hospitalist/specialist.  Yes   Past Medical/Surgical History: Past Medical  History:  Diagnosis Date  . Acute pancreatitis 04/13/2020  . Anasarca 10/2019  . AVN (avascular necrosis of bone) ([place])   . Cataract   . Chronic pain syndrome   . CKD (chronic kidney disease), stage III   . Crohn disease ([place])   . Crohn disease ([place])   . Depression   . Diverticulosis   . GERD (gastroesophageal reflux disease)   . HTN (hypertension)   . IDA (iron deficiency anemia)   . Malnutrition ([place])   . Mass in chest   . Osteoporosis   . Pancreatitis   . Short gut syndrome   . Vitamin B12 deficiency     Past Surgical History:  Procedure Laterality Date  . ABDOMINAL HYSTERECTOMY    . ABDOMINAL SURGERY     colon resection with abdominal stoma, repair of intestinal leak, reversal of abdominal stoma  . BILIARY DILATION  11/26/2019   Procedure: BILIARY DILATION;  Surgeon: Jackquline Denmark, MD;  Location: [facility];  Service: Endoscopy;;  . BILIARY DILATION  03/08/2020   Procedure: BILIARY DILATION;  Surgeon: Irving Copas., MD;  Location: [place];  Service: Gastroenterology;;  . BIOPSY  03/08/2020   Procedure: BIOPSY;  Surgeon: Irving Copas., MD;  Location: [place];  Service: Gastroenterology;;  . CHEST WALL RESECTION     right thoracotomy,resection of chest mass with anterior rib and reconstruction using prosthetic mesh and video arthroscopy  . CHOLECYSTECTOMY    . COLONOSCOPY  2019  . ERCP N/A 11/26/2019   Procedure: ENDOSCOPIC RETROGRADE CHOLANGIOPANCREATOGRAPHY (ERCP);  Surgeon: Jackquline Denmark, MD;  Location: [facility];  Service: Endoscopy;  Laterality: N/A;  . ERCP N/A 03/08/2020  Procedure: ENDOSCOPIC RETROGRADE CHOLANGIOPANCREATOGRAPHY (ERCP);  Surgeon: Irving Copas., MD;  Location: [place];  Service: Gastroenterology;  Laterality: N/A;  . ESOPHAGOGASTRODUODENOSCOPY N/A 03/08/2020   Procedure: ESOPHAGOGASTRODUODENOSCOPY (EGD);  Surgeon: Irving Copas., MD;  Location: [place];  Service: Gastroenterology;   Laterality: N/A;  . EUS N/A 03/08/2020   Procedure: UPPER ENDOSCOPIC ULTRASOUND (EUS) LINEAR;  Surgeon: Irving Copas., MD;  Location: [place];  Service: Gastroenterology;  Laterality: N/A;  . IR FLUORO GUIDE CV LINE LEFT  01/07/2020  . IR FLUORO GUIDE CV LINE LEFT  03/09/2020  . IR PTA VENOUS EXCEPT DIALYSIS CIRCUIT  01/07/2020  . IR US GUIDE VASC ACCESS LEFT     x 2 06/17/19 and 09/14/2019  . KNEE SURGERY     right knee   . REMOVAL OF STONES  11/26/2019   Procedure: REMOVAL OF STONES;  Surgeon: Jackquline Denmark, MD;  Location: [facility];  Service: Endoscopy;;  . REMOVAL OF STONES  03/08/2020   Procedure: REMOVAL OF STONES;  Surgeon: Irving Copas., MD;  Location: [place];  Service: Gastroenterology;;  . SMALL INTESTINE SURGERY     x3  . SPHINCTEROTOMY  11/26/2019   Procedure: SPHINCTEROTOMY;  Surgeon: Jackquline Denmark, MD;  Location: [facility];  Service: Endoscopy;;  . UPPER GASTROINTESTINAL ENDOSCOPY      Social History:  reports that she has quit smoking. She has never used smokeless tobacco. She reports previous alcohol use. She reports that she does not use drugs.  Allergies: Allergies  Allergen Reactions  . Cefepime Other (See Comments)    Neurotoxicity occurring in setting of AKI. Ceftriaxone tolerated during same admit  . Gabapentin Other (See Comments)    unknown  . Hyoscyamine Hives and Swelling    Legs swelling   Disorientation  . Lyrica [Pregabalin] Other (See Comments)    unknown  . Meperidine Hives    Other reaction(s): GI Upset Due to [place]   . Topamax [Topiramate] Other (See Comments)    unknown  . Fentanyl Rash    Pt is allergic to fentanyl patch related to the glue (gives her a rash) Pt states she is NOT allergic to fentanyl IV medicine  . Morphine And Related Rash    Family History:  Family History  Problem Relation Age of Onset  . Breast cancer Sister   . Multiple sclerosis Sister   . Diabetes Sister   . Lupus Sister    . Colon cancer Other   . Crohn's disease Other   . Seizures Mother   . Glaucoma Mother   . CAD Father   . Heart disease Father   . Hypertension Father      Current Outpatient Medications:  .  acetaminophen (TYLENOL) 325 MG tablet, Take 650 mg by mouth every 6 (six) hours as needed for mild pain. , Disp: , Rfl:  .  amLODipine (NORVASC) 5 MG tablet, Take 1 tablet (5 mg total) by mouth daily., Disp: 90 tablet, Rfl: 1 .  budesonide (ENTOCORT EC) 3 MG 24 hr capsule, TAKE 3 CAPSULES BY MOUTH ONCE DAILY (Patient taking differently: Take 6 mg by mouth daily. ), Disp: 90 capsule, Rfl: 0 .  buPROPion (WELLBUTRIN SR) 100 MG 12 hr tablet, 200 mg. 2 tablets in the morning, Disp: , Rfl:  .  cholecalciferol (VITAMIN D3) 25 MCG (1000 UT) tablet, Take 2,000 Units by mouth daily., Disp: , Rfl:  .  cycloSPORINE (RESTASIS) 0.05 % ophthalmic emulsion, Place 1 drop into both eyes 2 (two) times daily.,  Disp: , Rfl:  .  dexlansoprazole (DEXILANT) 60 MG capsule, Take 1 capsule (60 mg total) by mouth daily., Disp: 90 capsule, Rfl: 1 .  Dextran 70-Hypromellose 0.1-0.3 % SOLN, Place 1 drop into both eyes 4 (four) times daily., Disp: , Rfl:  .  dicyclomine (BENTYL) 20 MG tablet, Take 20 mg by mouth 3 (three) times daily., Disp: , Rfl:  .  diphenoxylate-atropine (LOMOTIL) 2.5-0.025 MG tablet, Take 1 tablet by mouth 4 (four) times daily as needed for diarrhea or loose stools., Disp: 30 tablet, Rfl: 0 .  DULoxetine (CYMBALTA) 30 MG capsule, Take 3 capsules (90 mg total) by mouth daily., Disp: , Rfl: 3 .  DULoxetine (CYMBALTA) 60 MG capsule, Take 60 mg by mouth daily., Disp: , Rfl:  .  estradiol (ESTRACE) 2 MG tablet, Take 1 tablet (2 mg total) by mouth daily., Disp: 90 tablet, Rfl: 1 .  famotidine (PEPCID) 20 MG tablet, Take 20 mg by mouth at bedtime., Disp: , Rfl:  .  HYDROmorphone (DILAUDID) 4 MG tablet, Take 1 tablet (4 mg total) by mouth every 6 (six) hours as needed for severe pain., Disp: 10 tablet, Rfl: 0 .   lipase/protease/amylase (CREON) 36000 UNITS CPEP capsule, Take 36,000 Units by mouth 3 (three) times daily as needed (with meals)., Disp: , Rfl:  .  loperamide (IMODIUM) 2 MG capsule, TAKE 1 CAPSULE BY MOUTH   AS NEEDED FOR DIARRHEA OR  LOOSE  STOOLS (Patient taking differently: Take 2 mg by mouth in the morning, at noon, in the evening, and at bedtime. ), Disp: 90 capsule, Rfl: 0 .  methadone (DOLOPHINE) 10 MG tablet, Take 1 tablet (10 mg total) by mouth every 8 (eight) hours. (Patient taking differently: Take 5 mg by mouth See admin instructions. 5 times daily), Disp: , Rfl:  .  methadone (DOLOPHINE) 5 MG tablet, Take 5 mg by mouth 5 (five) times daily. , Disp: , Rfl:  .  metoprolol tartrate (LOPRESSOR) 100 MG tablet, Take 1 tablet (100 mg total) by mouth 2 (two) times daily., Disp: 90 tablet, Rfl: 1 .  Multiple Vitamins-Minerals (MULTIVITAMIN ADULT PO), Take 1 tablet by mouth daily., Disp: , Rfl:  .  Omega-3 1000 MG CAPS, Take 1 capsule by mouth daily., Disp: , Rfl:  .  potassium chloride SA (KLOR-CON) 20 MEQ tablet, Take 40 mEq by mouth daily., Disp: , Rfl:  .  PRESCRIPTION MEDICATION, Home TPN [employer] [place], Disp: , Rfl:  .  Probiotic Product (PROBIOTIC-10 PO), Take 1 capsule by mouth daily., Disp: , Rfl:  .  promethazine (PHENERGAN) 25 MG tablet, TAKE 1 TABLET BY MOUTH EVERY 6 HOURS AS NEEDED FOR NAUSEA AND VOMITING (Patient taking differently: Take 25 mg by mouth every 6 (six) hours. TAKE 1 TABLET BY MOUTH EVERY 6 HOURS AS NEEDED FOR NAUSEA AND VOMITING), Disp: 90 tablet, Rfl: 0 .  sucralfate (CARAFATE) 1 GM/10ML suspension, Take 10 mLs (1 g total) by mouth 4 (four) times daily -  with meals and at bedtime., Disp: 420 mL, Rfl: 2 .  traZODone (DESYREL) 100 MG tablet, Take 2 tablets (200 mg total) by mouth at bedtime as needed for sleep., Disp: 60 tablet, Rfl: 0 .  vitamin B-12 (CYANOCOBALAMIN) 100 MCG tablet, Take 1,000 mcg by mouth daily. , Disp: , Rfl:   Review of Systems:    Constitutional: Denies fever, chills, diaphoresis and fatigue.  HEENT: Denies photophobia, eye pain, redness, hearing loss, ear pain, congestion, sore throat, rhinorrhea, sneezing, mouth sores, trouble  swallowing, neck pain, neck stiffness and tinnitus.   Respiratory: Denies SOB, DOE, cough, chest tightness,  and wheezing.   Cardiovascular: Denies chest pain, palpitations and leg swelling.  Gastrointestinal: Denies nausea, vomiting,  constipation, blood in stool and abdominal distention.  Genitourinary: Denies dysuria, urgency, frequency, hematuria, flank pain and difficulty urinating.  Endocrine: Denies: hot or cold intolerance, sweats, changes in hair or nails, polyuria, polydipsia. Musculoskeletal: Denies myalgias, back pain, joint swelling, arthralgias and gait problem.  Skin: Denies pallor, rash and wound.  Neurological: Denies dizziness, seizures, syncope, weakness, light-headedness, numbness and headaches.  Hematological: Denies adenopathy. Easy bruising, personal or family bleeding history  Psychiatric/Behavioral: Denies suicidal ideation, mood changes, confusion, nervousness, sleep disturbance and agitation    Physical Exam: Vitals:   04/19/20 0720  BP: 120/78  Temp: (!) 97.2 F (36.2 C)  TempSrc: Temporal  Weight: 144 lb 1.6 oz (65.4 kg)    Body mass index is 21.91 kg/m.   Constitutional: NAD, calm, comfortable Eyes: PERRL, lids and conjunctivae normal ENMT: Mucous membranes are moist.  Respiratory: clear to auscultation bilaterally, no wheezing, no crackles. Normal respiratory effort. No accessory muscle use.  Cardiovascular: Regular rate and rhythm, no murmurs / rubs / gallops. No extremity edema.  Abdomen: no tenderness to palpation, no masses palpated. No hepatosplenomegaly. Bowel sounds positive.  Musculoskeletal: no clubbing / cyanosis. No joint deformity upper and lower extremities. Good ROM, no contractures. Normal muscle tone.  Skin: no rashes, lesions,  ulcers. No induration Neurologic: Grossly intact and nonfocal Psychiatric: Normal judgment and insight. Alert and oriented x 3. Normal mood.    Impression and Plan:  Hospital discharge follow-up Intractable nausea and vomiting Stage 3b chronic kidney disease Severe protein-calorie malnutrition ([name]) Crohn's disease of small and large intestines with complication ([place]) Chronic pain syndrome Essential hypertension  Hospital records have been reviewed, she is doing well since discharge with only minimal, low-grade abdominal pain.  No nausea or vomiting.  She has chronic diarrhea but this is unchanged from prior baseline due to short gut syndrome.  She has follow-up with her pain management clinic next week.  She has home health that comes out to her house once a week to manage her PICC line and her TPN.  Her blood pressure is stable today.  Medical decision making of moderate complexity was utilized today.  Patient Instructions  -Nice seeing you today!!  -Please schedule your wellness visit for 3 months. Come in fasting that day.       [name], MD [place] [facility]

## 2020-04-19 NOTE — Telephone Encounter (Signed)
Transition Care Management Follow-up Telephone Call   Date discharged?01/20/20   How have you been since you were released from the hospital? "feel better but very sore".   Do you understand why you were in the hospital? yes   Do you understand the discharge instructions? yes   Where were you discharged to? home   Items Reviewed:  Medications reviewed: yes  Allergies reviewed: yes  Dietary changes reviewed: yes  Referrals reviewed: yes   Functional Questionnaire:   Activities of Daily Living (ADLs):   She states they are independent in the following: all States they require assistance with the following: none - lives/ has help with daughter   Any transportation issues/concerns?: no   Any patient concerns? Yes - results and HTN   Confirmed importance and date/time of follow-up visits scheduled yes  Provider Appointment booked with Dr Jerilee Hoh 01/21/00  Confirmed with patient if condition begins to worsen call PCP or go to the ER.  Patient was given the office number and encouraged to call back with question or concerns.  : yes

## 2020-04-19 NOTE — Patient Instructions (Signed)
-  Nice seeing you today!!  -Please schedule your wellness visit for 3 months. Come in fasting that day.

## 2020-04-20 ENCOUNTER — Encounter: Payer: Self-pay | Admitting: Infectious Disease

## 2020-04-26 ENCOUNTER — Telehealth: Payer: Self-pay | Admitting: Infectious Disease

## 2020-04-26 NOTE — Telephone Encounter (Signed)
I was fax labs on Caitlin Peters today and her creatinine is up to 2.51 liver function tests are also a bit out of whack with AST and ALT of 62 and 88 and an alkaline phosphatase of 237.  She needs workup for her acute on chronic renal.  LFT problems may be chronic  Can someone no longer make me responsible for her labs because I do NOT want to followup them while she is NOT on antibiotics

## 2020-04-26 NOTE — Telephone Encounter (Signed)
Spoke with Caitlin Peters who state Dr Domenica Fail is the physician responsible for patient's labs. Spoke with Melissa at [facility], they will forward all labs to TPN team, not Dr Hubert Azure, Lanice Schwab, RN

## 2020-04-27 NOTE — Telephone Encounter (Signed)
Thanks so much. 

## 2020-05-05 ENCOUNTER — Observation Stay (HOSPITAL_COMMUNITY): Payer: Medicare Other

## 2020-05-05 ENCOUNTER — Inpatient Hospital Stay (HOSPITAL_COMMUNITY)
Admission: AD | Admit: 2020-05-05 | Discharge: 2020-05-10 | DRG: 872 | Disposition: A | Discharge status: Home Health | Payer: Medicare Other | Attending: Internal Medicine | Admitting: Internal Medicine

## 2020-05-05 ENCOUNTER — Encounter (HOSPITAL_COMMUNITY): Payer: Self-pay

## 2020-05-05 ENCOUNTER — Emergency Department (HOSPITAL_COMMUNITY): Payer: Medicare Other

## 2020-05-05 DIAGNOSIS — K8689 Other specified diseases of pancreas: Secondary | ICD-10-CM | POA: Diagnosis present

## 2020-05-05 DIAGNOSIS — R7881 Bacteremia: Secondary | ICD-10-CM | POA: Diagnosis not present

## 2020-05-05 DIAGNOSIS — F329 Major depressive disorder, single episode, unspecified: Secondary | ICD-10-CM | POA: Diagnosis present

## 2020-05-05 DIAGNOSIS — K219 Gastro-esophageal reflux disease without esophagitis: Secondary | ICD-10-CM | POA: Diagnosis present

## 2020-05-05 DIAGNOSIS — Z833 Family history of diabetes mellitus: Secondary | ICD-10-CM

## 2020-05-05 DIAGNOSIS — K5090 Crohn's disease, unspecified, without complications: Secondary | ICD-10-CM | POA: Diagnosis present

## 2020-05-05 DIAGNOSIS — Z79891 Long term (current) use of opiate analgesic: Secondary | ICD-10-CM

## 2020-05-05 DIAGNOSIS — B965 Pseudomonas (aeruginosa) (mallei) (pseudomallei) as the cause of diseases classified elsewhere: Secondary | ICD-10-CM | POA: Diagnosis present

## 2020-05-05 DIAGNOSIS — R945 Abnormal results of liver function studies: Secondary | ICD-10-CM | POA: Diagnosis present

## 2020-05-05 DIAGNOSIS — Z8719 Personal history of other diseases of the digestive system: Secondary | ICD-10-CM

## 2020-05-05 DIAGNOSIS — Z8249 Family history of ischemic heart disease and other diseases of the circulatory system: Secondary | ICD-10-CM

## 2020-05-05 DIAGNOSIS — Z9049 Acquired absence of other specified parts of digestive tract: Secondary | ICD-10-CM

## 2020-05-05 DIAGNOSIS — E876 Hypokalemia: Secondary | ICD-10-CM | POA: Diagnosis present

## 2020-05-05 DIAGNOSIS — R7989 Other specified abnormal findings of blood chemistry: Secondary | ICD-10-CM

## 2020-05-05 DIAGNOSIS — I10 Essential (primary) hypertension: Secondary | ICD-10-CM | POA: Diagnosis present

## 2020-05-05 DIAGNOSIS — R509 Fever, unspecified: Secondary | ICD-10-CM | POA: Diagnosis not present

## 2020-05-05 DIAGNOSIS — T80219A Unspecified infection due to central venous catheter, initial encounter: Secondary | ICD-10-CM

## 2020-05-05 DIAGNOSIS — Z789 Other specified health status: Secondary | ICD-10-CM | POA: Diagnosis present

## 2020-05-05 DIAGNOSIS — Z820 Family history of epilepsy and other diseases of the nervous system: Secondary | ICD-10-CM

## 2020-05-05 DIAGNOSIS — N189 Chronic kidney disease, unspecified: Secondary | ICD-10-CM

## 2020-05-05 DIAGNOSIS — R1030 Lower abdominal pain, unspecified: Secondary | ICD-10-CM

## 2020-05-05 DIAGNOSIS — Z87891 Personal history of nicotine dependence: Secondary | ICD-10-CM

## 2020-05-05 DIAGNOSIS — Z79899 Other long term (current) drug therapy: Secondary | ICD-10-CM

## 2020-05-05 DIAGNOSIS — F32A Depression, unspecified: Secondary | ICD-10-CM | POA: Diagnosis present

## 2020-05-05 DIAGNOSIS — Z803 Family history of malignant neoplasm of breast: Secondary | ICD-10-CM

## 2020-05-05 DIAGNOSIS — N179 Acute kidney failure, unspecified: Secondary | ICD-10-CM | POA: Diagnosis present

## 2020-05-05 DIAGNOSIS — Z87898 Personal history of other specified conditions: Secondary | ICD-10-CM

## 2020-05-05 DIAGNOSIS — N1831 Chronic kidney disease, stage 3a: Secondary | ICD-10-CM | POA: Diagnosis present

## 2020-05-05 DIAGNOSIS — I129 Hypertensive chronic kidney disease with stage 1 through stage 4 chronic kidney disease, or unspecified chronic kidney disease: Secondary | ICD-10-CM | POA: Diagnosis present

## 2020-05-05 DIAGNOSIS — D509 Iron deficiency anemia, unspecified: Secondary | ICD-10-CM | POA: Diagnosis present

## 2020-05-05 DIAGNOSIS — D631 Anemia in chronic kidney disease: Secondary | ICD-10-CM | POA: Diagnosis present

## 2020-05-05 DIAGNOSIS — Z20822 Contact with and (suspected) exposure to covid-19: Secondary | ICD-10-CM | POA: Diagnosis present

## 2020-05-05 DIAGNOSIS — Z832 Family history of diseases of the blood and blood-forming organs and certain disorders involving the immune mechanism: Secondary | ICD-10-CM

## 2020-05-05 DIAGNOSIS — K912 Postsurgical malabsorption, not elsewhere classified: Secondary | ICD-10-CM

## 2020-05-05 DIAGNOSIS — Z79890 Hormone replacement therapy (postmenopausal): Secondary | ICD-10-CM

## 2020-05-05 LAB — CBC WITH DIFFERENTIAL/PLATELET
Abs Immature Granulocytes: 0.02 10*3/uL (ref 0.00–0.07)
Basophils Absolute: 0 10*3/uL (ref 0.0–0.1)
Basophils Relative: 0 %
Eosinophils Absolute: 0 10*3/uL (ref 0.0–0.5)
Eosinophils Relative: 0 %
HCT: 26.6 % — ABNORMAL LOW (ref 36.0–46.0)
Hemoglobin: 8.7 g/dL — ABNORMAL LOW (ref 12.0–15.0)
Immature Granulocytes: 0 %
Lymphocytes Relative: 5 %
Lymphs Abs: 0.3 10*3/uL — ABNORMAL LOW (ref 0.7–4.0)
MCH: 31.3 pg (ref 26.0–34.0)
MCHC: 32.7 g/dL (ref 30.0–36.0)
MCV: 95.7 fL (ref 80.0–100.0)
Monocytes Absolute: 0.2 10*3/uL (ref 0.1–1.0)
Monocytes Relative: 3 %
Neutro Abs: 5.4 10*3/uL (ref 1.7–7.7)
Neutrophils Relative %: 92 %
Platelets: 123 10*3/uL — ABNORMAL LOW (ref 150–400)
RBC: 2.78 MIL/uL — ABNORMAL LOW (ref 3.87–5.11)
RDW: 13.7 % (ref 11.5–15.5)
WBC: 6 10*3/uL (ref 4.0–10.5)
nRBC: 0 % (ref 0.0–0.2)

## 2020-05-05 LAB — URINALYSIS, ROUTINE W REFLEX MICROSCOPIC
Bilirubin Urine: NEGATIVE
Glucose, UA: NEGATIVE mg/dL
Hgb urine dipstick: NEGATIVE
Ketones, ur: NEGATIVE mg/dL
Leukocytes,Ua: NEGATIVE
Nitrite: NEGATIVE
Protein, ur: NEGATIVE mg/dL
Specific Gravity, Urine: 1.016 (ref 1.005–1.030)
pH: 7 (ref 5.0–8.0)

## 2020-05-05 LAB — COMPREHENSIVE METABOLIC PANEL
ALT: 604 U/L — ABNORMAL HIGH (ref 0–44)
AST: 637 U/L — ABNORMAL HIGH (ref 15–41)
Albumin: 3.3 g/dL — ABNORMAL LOW (ref 3.5–5.0)
Alkaline Phosphatase: 187 U/L — ABNORMAL HIGH (ref 38–126)
Anion gap: 8 (ref 5–15)
BUN: 50 mg/dL — ABNORMAL HIGH (ref 6–20)
CO2: 28 mmol/L (ref 22–32)
Calcium: 8.5 mg/dL — ABNORMAL LOW (ref 8.9–10.3)
Chloride: 102 mmol/L (ref 98–111)
Creatinine, Ser: 1.88 mg/dL — ABNORMAL HIGH (ref 0.44–1.00)
GFR calc Af Amer: 33 mL/min — ABNORMAL LOW (ref >60)
GFR calc non Af Amer: 29 mL/min — ABNORMAL LOW (ref >60)
Glucose, Bld: 85 mg/dL (ref 70–99)
Potassium: 3.2 mmol/L — ABNORMAL LOW (ref 3.5–5.1)
Sodium: 138 mmol/L (ref 135–145)
Total Bilirubin: 0.9 mg/dL (ref 0.3–1.2)
Total Protein: 6.7 g/dL (ref 6.5–8.1)

## 2020-05-05 LAB — SARS CORONAVIRUS 2 BY RT PCR (HOSPITAL ORDER, PERFORMED IN ~~LOC~~ HOSPITAL LAB): SARS Coronavirus 2: NEGATIVE

## 2020-05-05 LAB — ACETAMINOPHEN LEVEL: Acetaminophen (Tylenol), Serum: <10 ug/mL — ABNORMAL LOW (ref 10–30)

## 2020-05-05 LAB — LIPASE, BLOOD: Lipase: 19 U/L (ref 11–51)

## 2020-05-05 LAB — MAGNESIUM: Magnesium: 2.1 mg/dL (ref 1.7–2.4)

## 2020-05-05 LAB — LACTIC ACID, PLASMA: Lactic Acid, Venous: 0.8 mmol/L (ref 0.5–1.9)

## 2020-05-05 MED ORDER — ONDANSETRON HCL 4 MG/2ML IJ SOLN
4.0000 mg | Freq: Four times a day (QID) | INTRAMUSCULAR | Status: DC | PRN
  Administered 2020-05-06 – 2020-05-10 (×17): 4 mg via INTRAVENOUS
  Filled 2020-05-05 (×18): qty 2

## 2020-05-05 MED ORDER — METOPROLOL TARTRATE 25 MG PO TABS
100.0000 mg | ORAL_TABLET | Freq: Two times a day (BID) | ORAL | Status: DC
  Administered 2020-05-05 – 2020-05-10 (×10): 100 mg via ORAL
  Filled 2020-05-05 (×10): qty 4

## 2020-05-05 MED ORDER — SODIUM CHLORIDE 0.9 % IV SOLN
2.0000 g | Freq: Once | INTRAVENOUS | Status: AC
  Administered 2020-05-05: 2 g via INTRAVENOUS
  Filled 2020-05-05: qty 2

## 2020-05-05 MED ORDER — BUPROPION HCL ER (SR) 100 MG PO TB12
200.0000 mg | ORAL_TABLET | Freq: Every day | ORAL | Status: DC
  Administered 2020-05-06 – 2020-05-10 (×5): 200 mg via ORAL
  Filled 2020-05-05 (×5): qty 2

## 2020-05-05 MED ORDER — PANTOPRAZOLE SODIUM 40 MG PO TBEC
40.0000 mg | DELAYED_RELEASE_TABLET | Freq: Every day | ORAL | Status: DC
  Administered 2020-05-07 – 2020-05-10 (×4): 40 mg via ORAL
  Filled 2020-05-05 (×3): qty 1

## 2020-05-05 MED ORDER — VANCOMYCIN HCL IN DEXTROSE 1-5 GM/200ML-% IV SOLN
1000.0000 mg | Freq: Once | INTRAVENOUS | Status: DC
  Filled 2020-05-05: qty 200

## 2020-05-05 MED ORDER — ACETAMINOPHEN 500 MG PO TABS
1000.0000 mg | ORAL_TABLET | Freq: Once | ORAL | Status: AC
  Administered 2020-05-05: 1000 mg via ORAL
  Filled 2020-05-05: qty 2

## 2020-05-05 MED ORDER — PROMETHAZINE HCL 25 MG/ML IJ SOLN
12.5000 mg | Freq: Four times a day (QID) | INTRAMUSCULAR | Status: DC | PRN
  Administered 2020-05-05 – 2020-05-10 (×18): 12.5 mg via INTRAVENOUS
  Filled 2020-05-05 (×18): qty 1

## 2020-05-05 MED ORDER — SODIUM CHLORIDE 0.9 % IV BOLUS
1000.0000 mL | Freq: Once | INTRAVENOUS | Status: AC
  Administered 2020-05-05: 1000 mL via INTRAVENOUS

## 2020-05-05 MED ORDER — HYDROMORPHONE HCL 1 MG/ML IJ SOLN
1.0000 mg | Freq: Once | INTRAMUSCULAR | Status: AC
  Administered 2020-05-05: 1 mg via INTRAVENOUS
  Filled 2020-05-05: qty 1

## 2020-05-05 MED ORDER — DEXTRAN 70-HYPROMELLOSE 0.1-0.3 % OP SOLN: 1.0000 [drp] | Freq: Four times a day (QID) | OPHTHALMIC | Status: DC

## 2020-05-05 MED ORDER — FENTANYL CITRATE (PF) 100 MCG/2ML IJ SOLN
50.0000 ug | Freq: Once | INTRAMUSCULAR | Status: AC
  Administered 2020-05-05: 50 ug via INTRAVENOUS
  Filled 2020-05-05: qty 2

## 2020-05-05 MED ORDER — SODIUM CHLORIDE 0.9% FLUSH: 3.0000 mL | Freq: Once | INTRAVENOUS | Status: DC

## 2020-05-05 MED ORDER — POTASSIUM CHLORIDE 2 MEQ/ML IV SOLN
INTRAVENOUS | Status: DC
  Filled 2020-05-05 (×7): qty 1000

## 2020-05-05 MED ORDER — POLYVINYL ALCOHOL 1.4 % OP SOLN
1.0000 [drp] | Freq: Four times a day (QID) | OPHTHALMIC | Status: DC
  Administered 2020-05-05 – 2020-05-10 (×17): 1 [drp] via OPHTHALMIC
  Filled 2020-05-05 (×2): qty 15

## 2020-05-05 MED ORDER — AMLODIPINE BESYLATE 5 MG PO TABS
5.0000 mg | ORAL_TABLET | Freq: Every day | ORAL | Status: DC
  Administered 2020-05-06 – 2020-05-07 (×2): 5 mg via ORAL
  Filled 2020-05-05 (×2): qty 1

## 2020-05-05 MED ORDER — CYCLOSPORINE 0.05 % OP EMUL
1.0000 [drp] | Freq: Two times a day (BID) | OPHTHALMIC | Status: DC
  Administered 2020-05-05 – 2020-05-10 (×10): 1 [drp] via OPHTHALMIC
  Filled 2020-05-05 (×11): qty 1

## 2020-05-05 MED ORDER — BUDESONIDE 3 MG PO CPEP
6.0000 mg | ORAL_CAPSULE | Freq: Every day | ORAL | Status: DC
  Filled 2020-05-05: qty 2

## 2020-05-05 MED ORDER — METRONIDAZOLE IN NACL 5-0.79 MG/ML-% IV SOLN
500.0000 mg | Freq: Once | INTRAVENOUS | Status: AC
  Administered 2020-05-05: 500 mg via INTRAVENOUS
  Filled 2020-05-05: qty 100

## 2020-05-05 MED ORDER — HEPARIN SODIUM (PORCINE) 5000 UNIT/ML IJ SOLN
5000.0000 [IU] | Freq: Three times a day (TID) | INTRAMUSCULAR | Status: DC
  Administered 2020-05-06 – 2020-05-10 (×10): 5000 [IU] via SUBCUTANEOUS
  Filled 2020-05-05 (×11): qty 1

## 2020-05-05 MED ORDER — POTASSIUM CHLORIDE CRYS ER 20 MEQ PO TBCR
40.0000 meq | EXTENDED_RELEASE_TABLET | Freq: Every day | ORAL | Status: DC
  Administered 2020-05-06: 40 meq via ORAL
  Filled 2020-05-05: qty 2

## 2020-05-05 MED ORDER — PANCRELIPASE (LIP-PROT-AMYL) 36000-114000 UNITS PO CPEP
36000.0000 [IU] | ORAL_CAPSULE | Freq: Three times a day (TID) | ORAL | Status: DC | PRN
  Administered 2020-05-10: 36000 [IU] via ORAL
  Filled 2020-05-05 (×2): qty 1

## 2020-05-05 MED ORDER — SUCRALFATE 1 GM/10ML PO SUSP
1.0000 g | Freq: Four times a day (QID) | ORAL | Status: DC
  Filled 2020-05-05 (×9): qty 10

## 2020-05-05 MED ORDER — TRAZODONE HCL 100 MG PO TABS
200.0000 mg | ORAL_TABLET | Freq: Every evening | ORAL | Status: DC | PRN
  Administered 2020-05-06: 200 mg via ORAL
  Filled 2020-05-05 (×2): qty 2

## 2020-05-05 MED ORDER — POTASSIUM CHLORIDE 10 MEQ/100ML IV SOLN
10.0000 meq | INTRAVENOUS | Status: AC
  Administered 2020-05-05 (×2): 10 meq via INTRAVENOUS
  Filled 2020-05-05 (×2): qty 100

## 2020-05-05 MED ORDER — BUDESONIDE 3 MG PO CPEP
6.0000 mg | ORAL_CAPSULE | Freq: Every day | ORAL | Status: DC
  Administered 2020-05-06 – 2020-05-10 (×5): 6 mg via ORAL
  Filled 2020-05-05 (×5): qty 2

## 2020-05-05 MED ORDER — HYDROMORPHONE HCL 2 MG/ML IJ SOLN
2.0000 mg | INTRAMUSCULAR | Status: DC | PRN
  Administered 2020-05-05 – 2020-05-10 (×37): 2 mg via INTRAVENOUS
  Filled 2020-05-05 (×37): qty 1

## 2020-05-05 MED ORDER — ONDANSETRON HCL 4 MG PO TABS: 4.0000 mg | ORAL_TABLET | Freq: Four times a day (QID) | ORAL | Status: DC | PRN

## 2020-05-05 MED ORDER — DULOXETINE HCL 30 MG PO CPEP
90.0000 mg | ORAL_CAPSULE | Freq: Every day | ORAL | Status: DC
  Administered 2020-05-07 – 2020-05-10 (×4): 90 mg via ORAL
  Filled 2020-05-05 (×5): qty 3

## 2020-05-05 MED ORDER — VANCOMYCIN HCL 1500 MG/300ML IV SOLN
1500.0000 mg | Freq: Once | INTRAVENOUS | Status: AC
  Administered 2020-05-05: 1500 mg via INTRAVENOUS
  Filled 2020-05-05: qty 300

## 2020-05-05 MED ORDER — POTASSIUM CHLORIDE 2 MEQ/ML IV SOLN
INTRAVENOUS | Status: DC
  Filled 2020-05-05: qty 1000

## 2020-05-05 MED ORDER — DIPHENOXYLATE-ATROPINE 2.5-0.025 MG PO TABS
1.0000 | ORAL_TABLET | Freq: Four times a day (QID) | ORAL | Status: DC | PRN
  Administered 2020-05-06 – 2020-05-08 (×2): 1 via ORAL
  Filled 2020-05-05 (×3): qty 1

## 2020-05-05 MED ORDER — DICYCLOMINE HCL 20 MG PO TABS
20.0000 mg | ORAL_TABLET | Freq: Three times a day (TID) | ORAL | Status: DC
  Administered 2020-05-06 – 2020-05-10 (×13): 20 mg via ORAL
  Filled 2020-05-05 (×15): qty 1

## 2020-05-05 MED ORDER — TRAMADOL HCL 50 MG PO TABS
50.0000 mg | ORAL_TABLET | Freq: Three times a day (TID) | ORAL | Status: DC | PRN
  Filled 2020-05-05: qty 1

## 2020-05-05 MED ORDER — ONDANSETRON HCL 4 MG/2ML IJ SOLN
4.0000 mg | Freq: Once | INTRAMUSCULAR | Status: AC
  Administered 2020-05-05: 4 mg via INTRAVENOUS
  Filled 2020-05-05: qty 2

## 2020-05-05 NOTE — Progress Notes (Signed)
Pt triggered yellow mews due to pulse 115. Pt had just transferred to bed. Re-cheched pulse 15 minutes later, pulse was normal. Camera operator notified.

## 2020-05-05 NOTE — H&P (Signed)
History and Physical    Caitlin Peters JTT:[id number] DOB: 1960-12-05 DOA: 05/05/2020  PCP: [name], [name], MD  Patient coming from: Home  I have personally briefly reviewed patient's old medical records in [place]  Chief Complaint: Fever, abdominal pain  HPI: Caitlin Peters is a 59 y.o. 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  Patient reports multiple hospitalizations for recurrent Crohn's disease related abdominal pain and frequent PICC line infections.  She is most recently hospitalized from 04/13/2020-04/18/2020 for significant nausea, vomiting, diarrhea, and abdominal pain.  She was managed with supportive care and discharged home.  Patient states she has been managing fairly well since then until earlier today (05/05/2020) when she developed a fever up to 102 Fahrenheit.  She had associated chills and body aches.  She has continued nausea and abdominal pain however feels this is less severe and less frequent than when she was hospitalized last time.  She feels that her left femoral PICC line is functioning well and has not seen any skin changes or obvious wounds.  She denies any associated cough, chest pain, dyspnea, dysuria.  She reports chronic diarrhea which is becoming less frequent and only occurring up to 3 times per day now.  She feels that she is not able to maintain adequate oral intake at this time.  ED Course:  Initial vitals showed BP 153/70, pulse 68, RR 16, temp 103.0 Fahrenheit, SPO2 99% on room air.  WBC 6.0, hemoglobin 8.7, platelets 123,000, sodium 138, potassium 3.2, bicarb 28, BUN 50, creatinine 1.88 (1.26 on 04/18/2020), serum glucose 85,  AST 637, ALT 604, alk phos 187, total bilirubin 0.9, lactic acid 0.8.  Acute hepatitis panel was ordered and pending.  Urinalysis was negative for UTI.  Blood cultures were obtained and pending.  SARS-CoV-2 PCR is obtained and negative.  CT abdomen/pelvis without contrast was negative for acute findings, stable pneumobilia seen without evidence of bili obstruction, diffuse pancreatic ductal dilatation is seen again, stable postop changes from prior bowel resection without evidence of obstruction, inflammatory process or abnormal fluid collection seen.  Patient was given 2 L normal saline, IV K 10 mEq x 2 runs, Dilaudid, fentanyl, Zofran, and empiric IV vancomycin/aztreonam/Flagyl.  The hospitalist service was consulted to admit for further evaluation and management.  Review of Systems: All systems reviewed and are negative except as documented in history of present illness above.   Past Medical History:  Diagnosis Date  . Acute pancreatitis 04/13/2020  . Anasarca 10/2019  . AVN (avascular necrosis of bone) ([place])   . Cataract   . Chronic pain syndrome   . CKD (chronic kidney disease), stage III   . Crohn disease ([place])   . Crohn disease ([place])   . Depression   . Diverticulosis   . GERD (gastroesophageal reflux disease)   . HTN (hypertension)   . IDA (iron deficiency anemia)   . Malnutrition ([place])   . Mass in chest   . Osteoporosis   . Pancreatitis   . Short gut syndrome   . Vitamin B12 deficiency     Past Surgical History:  Procedure Laterality Date  . ABDOMINAL HYSTERECTOMY    . ABDOMINAL SURGERY     colon resection with abdominal  stoma, repair of intestinal leak, reversal of abdominal stoma  . BILIARY DILATION  11/26/2019   Procedure: BILIARY DILATION;  Surgeon: Jackquline Denmark, MD;  Location: [facility];  Service: Endoscopy;;  . BILIARY DILATION  03/08/2020   Procedure: BILIARY DILATION;  Surgeon: Irving Copas., MD;  Location: [facility];  Service:  Gastroenterology;;  . BIOPSY  03/08/2020   Procedure: BIOPSY;  Surgeon: Irving Copas., MD;  Location: [facility];  Service: Gastroenterology;;  . CHEST WALL RESECTION     right thoracotomy,resection of chest mass with anterior rib and reconstruction using prosthetic mesh and video arthroscopy  . CHOLECYSTECTOMY    . COLONOSCOPY  2019  . ERCP N/A 11/26/2019   Procedure: ENDOSCOPIC RETROGRADE CHOLANGIOPANCREATOGRAPHY (ERCP);  Surgeon: Jackquline Denmark, MD;  Location: [facility];  Service: Endoscopy;  Laterality: N/A;  . ERCP N/A 03/08/2020   Procedure: ENDOSCOPIC RETROGRADE CHOLANGIOPANCREATOGRAPHY (ERCP);  Surgeon: Irving Copas., MD;  Location: [facility];  Service: Gastroenterology;  Laterality: N/A;  . ESOPHAGOGASTRODUODENOSCOPY N/A 03/08/2020   Procedure: ESOPHAGOGASTRODUODENOSCOPY (EGD);  Surgeon: Irving Copas., MD;  Location: [facility];  Service: Gastroenterology;  Laterality: N/A;  . EUS N/A 03/08/2020   Procedure: UPPER ENDOSCOPIC ULTRASOUND (EUS) LINEAR;  Surgeon: Irving Copas., MD;  Location: [facility];  Service: Gastroenterology;  Laterality: N/A;  . IR FLUORO GUIDE CV LINE LEFT  01/07/2020  . IR FLUORO GUIDE CV LINE LEFT  03/09/2020  . IR PTA VENOUS EXCEPT DIALYSIS CIRCUIT  01/07/2020  . IR US GUIDE VASC ACCESS LEFT     x 2 06/17/19 and 09/14/2019  . KNEE SURGERY     right knee   . REMOVAL OF STONES  11/26/2019   Procedure: REMOVAL OF STONES;  Surgeon: Jackquline Denmark, MD;  Location: [facility];  Service: Endoscopy;;  . REMOVAL OF STONES  03/08/2020   Procedure: REMOVAL OF STONES;  Surgeon: Irving Copas., MD;  Location: [facility];  Service: Gastroenterology;;  . SMALL INTESTINE SURGERY     x3  . SPHINCTEROTOMY  11/26/2019   Procedure: SPHINCTEROTOMY;  Surgeon: Jackquline Denmark, MD;  Location: [facility];  Service: Endoscopy;;  . UPPER GASTROINTESTINAL ENDOSCOPY      Social History:  reports that she has quit smoking. She  has never used smokeless tobacco. She reports previous alcohol use. She reports that she does not use drugs.  Allergies  Allergen Reactions  . Cefepime Other (See Comments)    Neurotoxicity occurring in setting of AKI. Ceftriaxone tolerated during same admit  . Gabapentin Other (See Comments)    unknown  . Hyoscyamine Hives and Swelling    Legs swelling   Disorientation  . Lyrica [Pregabalin] Other (See Comments)    unknown  . Meperidine Hives    Other reaction(s): GI Upset Due to [place]   . Topamax [Topiramate] Other (See Comments)    unknown  . Fentanyl Rash    Pt is allergic to fentanyl patch related to the glue (gives her a rash) Pt states she is NOT allergic to fentanyl IV medicine  . Morphine And Related Rash    Family History  Problem Relation Age of Onset  . Breast cancer Sister   . Multiple sclerosis Sister   . Diabetes Sister   . Lupus Sister   . Colon cancer Other   . Crohn's disease Other   . Seizures Mother   . Glaucoma Mother   . CAD Father   . Heart disease Father   . Hypertension Father  Prior to Admission medications   Medication Sig Start Date End Date Taking? Authorizing Provider  acetaminophen (TYLENOL) 325 MG tablet Take 650 mg by mouth every 6 (six) hours as needed for mild pain.    Yes [provider]  amLODipine (NORVASC) 5 MG tablet Take 1 tablet (5 mg total) by mouth daily. 10/16/19  Yes [name], [name], MD  budesonide (ENTOCORT EC) 3 MG 24 hr capsule TAKE 3 CAPSULES BY MOUTH ONCE DAILY Patient taking differently: Take 6 mg by mouth daily.  04/05/20  Yes [name], [name], MD  buPROPion [facility] SR) 100 MG 12 hr tablet Take 200 mg by mouth daily.    Yes [provider]  cholecalciferol (VITAMIN D3) 25 MCG (1000 UT) tablet Take 1,000 Units by mouth daily.    Yes [provider]  cycloSPORINE (RESTASIS) 0.05 % ophthalmic emulsion Place 1 drop into both eyes 2 (two) times daily.   Yes  [provider]  dexlansoprazole (DEXILANT) 60 MG capsule Take 1 capsule (60 mg total) by mouth daily. 10/16/19  Yes [name], [name], MD  Dextran 70-Hypromellose 0.1-0.3 % SOLN Place 1 drop into both eyes 4 (four) times daily.   Yes [provider]  dicyclomine (BENTYL) 20 MG tablet Take 20 mg by mouth 3 (three) times daily. 10/26/19  Yes [provider]  diphenoxylate-atropine (LOMOTIL) 2.5-0.025 MG tablet Take 1 tablet by mouth 4 (four) times daily as needed for diarrhea or loose stools. 10/31/19  Yes Dessa Phi, DO  DULoxetine (CYMBALTA) 30 MG capsule Take 3 capsules (90 mg total) by mouth daily. Patient taking differently: Take 90 mg by mouth daily. Takes along with 60 mg tablet (Total 90 mg) 11/01/19  Yes Dessa Phi, DO  DULoxetine (CYMBALTA) 60 MG capsule Take 60 mg by mouth daily. Takes along with 30 mg tablet (Total 90 mg) 12/09/19  Yes [provider]  estradiol (ESTRACE) 2 MG tablet Take 1 tablet (2 mg total) by mouth daily. 12/10/19  Yes [name], [name], MD  famotidine (PEPCID) 20 MG tablet Take 20 mg by mouth at bedtime.   Yes [provider]  HYDROmorphone (DILAUDID) 4 MG tablet Take 1 tablet (4 mg total) by mouth every 6 (six) hours as needed for severe pain. 12/06/19  Yes Ripley Fraise, MD  lipase/protease/amylase (CREON) 36000 UNITS CPEP capsule Take 36,000 Units by mouth 3 (three) times daily as needed (with meals for digestion).    Yes [provider]  loperamide (IMODIUM) 2 MG capsule TAKE 1 CAPSULE BY MOUTH   AS NEEDED FOR DIARRHEA OR  LOOSE  STOOLS Patient taking differently: Take 2 mg by mouth in the morning, at noon, in the evening, and at bedtime.  03/22/20  Yes [name], [name], MD  methadone (DOLOPHINE) 5 MG tablet Take 5 mg by mouth 5 (five) times daily.  03/29/20  Yes [provider]  metoprolol tartrate (LOPRESSOR) 100 MG tablet Take 1 tablet (100 mg total) by mouth 2 (two)  times daily. 03/03/20  Yes [name], [name], MD  Multiple Vitamins-Minerals (MULTIVITAMIN ADULT PO) Take 1 tablet by mouth daily.   Yes [provider]  Omega-3 1000 MG CAPS Take 1,000 mg by mouth daily.    Yes [provider]  potassium chloride SA (KLOR-CON) 20 MEQ tablet Take 40 mEq by mouth daily.   Yes [provider]  PRESCRIPTION MEDICATION Inject 1 each into the vein daily. Home TPN . [employer] in [facility]  [place] . 1 bag for 16 hours.   Yes [provider]  Probiotic Product (PROBIOTIC-10 PO) Take 2 capsules by mouth daily.    Yes [provider]  promethazine (PHENERGAN) 25 MG tablet TAKE 1 TABLET BY MOUTH EVERY 6 HOURS AS NEEDED FOR NAUSEA AND VOMITING Patient taking differently: Take 25 mg by mouth every 6 (six) hours.  04/12/20  Yes [name], [name], MD  traZODone (DESYREL) 100 MG tablet Take 2 tablets (200 mg total) by mouth at bedtime as needed for sleep. 12/31/19  Yes [name], [name], MD  vitamin B-12 (CYANOCOBALAMIN) 100 MCG tablet Take 1,000 mcg by mouth daily.    Yes [provider]  methadone (DOLOPHINE) 10 MG tablet Take 1 tablet (10 mg total) by mouth every 8 (eight) hours. Patient not taking: Reported on 05/05/2020 01/11/20   Modena Jansky, MD  sucralfate (CARAFATE) 1 GM/10ML suspension Take 10 mLs (1 g total) by mouth 4 (four) times daily -  with meals and at bedtime. Patient taking differently: Take 1 g by mouth 4 (four) times daily.  12/15/19   Erline Hau, MD    Physical Exam: Vitals:   05/05/20 1800 05/05/20 1830 05/05/20 1900 05/05/20 1941  BP: (!) 104/48 (!) 111/56 109/64 (!) 124/57  Pulse:   66 62  Resp:   16 16  Temp:    98.1 F (36.7 C)  TempSrc:    Oral  SpO2:   100% 99%   Constitutional: Resting supine in bed, NAD, calm, comfortable Eyes: PERRL, lids and conjunctivae normal ENMT: Mucous membranes are dry. Posterior pharynx clear of any exudate or  lesions. Neck: normal, supple, no masses. Respiratory: clear to auscultation bilaterally, no wheezing, no crackles. Normal respiratory effort. No accessory muscle use.  Cardiovascular: Regular rate and rhythm, soft systolic murmur. No extremity edema. 2+ pedal pulses. Abdomen: Generalized abdominal tenderness, no masses palpated. No hepatosplenomegaly. Bowel sounds positive.  Musculoskeletal: no clubbing / cyanosis. No joint deformity upper and lower extremities. Good ROM, no contractures. Normal muscle tone.  Skin: Left femoral PICC line appears clean, dry, intact.  No rashes, lesions, ulcers. No induration.  Multiple well-healed abdominal surgical scars. Neurologic: CN 2-12 grossly intact. Sensation intact, Strength 5/5 in all 4.  Psychiatric: Normal judgment and insight. Alert and oriented x 3. Normal mood.    Labs on Admission: I have personally reviewed following labs and imaging studies  CBC: Recent Labs  Lab 05/05/20 1539  WBC 6.0  NEUTROABS 5.4  HGB 8.7*  HCT 26.6*  MCV 95.7  PLT 607*   Basic Metabolic Panel: Recent Labs  Lab 05/05/20 1539  NA 138  K 3.2*  CL 102  CO2 28  GLUCOSE 85  BUN 50*  CREATININE 1.88*  CALCIUM 8.5*   GFR: CrCl cannot be calculated (Unknown ideal weight.). Liver Function Tests: Recent Labs  Lab 05/05/20 1539  AST 637*  ALT 604*  ALKPHOS 187*  BILITOT 0.9  PROT 6.7  ALBUMIN 3.3*   No results for input(s): LIPASE, AMYLASE in the last 168 hours. No results for input(s): AMMONIA in the last 168 hours. Coagulation Profile: No results for input(s): INR, PROTIME in the last 168 hours. Cardiac Enzymes: No results for input(s): CKTOTAL, CKMB, CKMBINDEX, TROPONINI in the last 168 hours. BNP (last 3 results) No results for input(s): PROBNP in the last 8760 hours. HbA1C: No results for input(s): HGBA1C in the last 72 hours. CBG: No results for input(s): GLUCAP in the last 168  hours. Lipid Profile: No results for input(s): CHOL, HDL,  LDLCALC, TRIG, CHOLHDL, LDLDIRECT in the last 72 hours. Thyroid Function Tests: No results for input(s): TSH, T4TOTAL, FREET4, T3FREE, THYROIDAB in the last 72 hours. Anemia Panel: No results for input(s): VITAMINB12, FOLATE, FERRITIN, TIBC, IRON, RETICCTPCT in the last 72 hours. Urine analysis:    Component Value Date/Time   COLORURINE YELLOW 05/05/2020 [place] 05/05/2020 1513   LABSPEC 1.016 05/05/2020 1513   PHURINE 7.0 05/05/2020 1513   GLUCOSEU NEGATIVE 05/05/2020 1513   HGBUR NEGATIVE 05/05/2020 1513   BILIRUBINUR NEGATIVE 05/05/2020 1513   KETONESUR NEGATIVE 05/05/2020 1513   PROTEINUR NEGATIVE 05/05/2020 1513   NITRITE NEGATIVE 05/05/2020 1513   [place] 05/05/2020 1513    Radiological Exams on Admission: CT Abdomen Pelvis Wo Contrast  Result Date: 05/05/2020 CLINICAL DATA:  Severe abdominal pain.  Fever.  Crohn disease. EXAM: CT ABDOMEN AND PELVIS WITHOUT CONTRAST TECHNIQUE: Multidetector CT imaging of the abdomen and pelvis was performed following the standard protocol without IV contrast. COMPARISON:  04/13/2020 FINDINGS: Lower chest: No acute findings. Hepatobiliary: No mass visualized on this unenhanced exam. Prior cholecystectomy. Pneumobilia again seen, without evidence of biliary obstruction. Pancreas: No mass or inflammatory process visualized on this unenhanced exam. Diffuse pancreatic ductal dilatation is demonstrated. Spleen:  Within normal limits in size. Adrenals/Urinary tract: No evidence of urolithiasis or hydronephrosis. Unremarkable unopacified urinary bladder. Stomach/Bowel: Stable postop changes from previous bowel resection. No evidence of obstruction, inflammatory process, or abnormal fluid collections. Vascular/Lymphatic: No pathologically enlarged lymph nodes identified. No evidence of abdominal aortic aneurysm. Left femoral vein catheter remains in place with tip in the IVC. Reproductive: Prior hysterectomy noted. Adnexal regions  are unremarkable in appearance. Other:  None. Musculoskeletal:  No suspicious bone lesions identified. IMPRESSION: Stable exam. No evidence of active Crohn disease or other acute findings. Electronically Signed   By: Marlaine Hind M.D.   On: 05/05/2020 17:49    EKG: Not performed.  Assessment/Plan Principal Problem:   Febrile illness Active Problems:   Hypokalemia   Crohn disease ([place])   Short gut syndrome   HTN (hypertension)   Depression   Acute kidney injury superimposed on chronic kidney disease ([name])   On total parenteral nutrition (TPN)   Abnormal LFTs  Caitlin Peters is a 59 y.o. 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  Febrile illness: Fever up to 103 Fahrenheit.  No obvious source however has had recurrent PICC line infections.  Left femoral PICC line Lasix changed 03/09/2020 and appears clean, dry, and intact.  Patient received empiric IV vancomycin, aztreonam, and Flagyl while in the ED.  Urinalysis is negative for UTI. -Follow blood cultures, hold further antibiotics for now -Obtain chest x-ray -Monitor fever curve  Extensive Crohn's disease s/p multiple bowel resections and resultant short bowel syndrome now TPN dependent with chronic abdominal pain: Follows with [facility], Dr. Domenica Fail. -Keep n.p.o. for now except sips with meds -Continue IV hydration with LR @ 100 mL/hr overnight -Hold TPN for now -Continue pain control with as needed oral tramadol and IV Dilaudid 2 mg q3h only for severe pain with hold parameters  AKI on CKD stage III: Creatinine 1.88 on admission compared to 1.26 on  04/18/2020. -Continue IV fluid hydration overnight and repeat labs in  the morning  Abnormal LFTs: AST 637, ALT 604, alk phos 187 with total bilirubin 0.9.  CT abdomen/pelvis shows stable chronic changes without acute findings. -Follow acute hepatitis panel -Repeat labs in the morning  Chronic pancreatic duct dilatation: Seen again on CT abdomen/pelvis and appears stable.  Hypokalemia: Replete and follow.  Hypertension: Continue amlodipine and Lopressor.  Anemia of chronic disease and iron deficiency: Chronic and stable without obvious bleeding.  Continue to monitor.  Depression: Continue Wellbutrin and Cymbalta.  Chronic pain: Continue tramadol and IV Dilaudid as needed with hold parameters as above.  DVT prophylaxis: Subcutaneous heparin Code Status: Full code, confirmed with patient Family Communication: Discussed with patient, she has discussed with family Disposition Plan: From home, likely discharge to home pending further culture data/fever work-up. Consults called: None Admission status:  Status is: Observation  The patient remains OBS appropriate and will d/c before 2 midnights.  Dispo: The patient is from: Home              Anticipated d/c is to: Home              Anticipated d/c date is: 1 day pending fever work-up              Patient currently is not medically stable to d/c.   Zada Finders MD [facility]  If 7PM-7AM, please contact night-coverage [email or web address]  05/05/2020, 8:39 PM

## 2020-05-05 NOTE — ED Provider Notes (Signed)
[facility] DEPT Provider Note   CSN: [id number] Arrival date & time: 05/05/20  1430     History Chief Complaint  Patient presents with  . Fever    Caitlin Peters is a 59 y.o. female with past medical history significant for chronic pain, CKD, Crohn's, GERD, hypertension, chronic malnutrition, short gut syndrome who presents for evaluation of fever and abdominal pain.  Was discharged on 04/15/2020 for similar symptoms however did not have a fever at that time.  Patient states she has had chills and body aches x24 hours.  States she is having worsening lower abdominal pain.  She has had some burning with urination.  Has chronic diarrhea without melena or bright red blood per rectum.  She has been not been able to tolerate any p.o. intake as she prefers to "rest my gut."  Has not taken anything for her fever.  Denies headache, lightheadedness, dizziness, congestion, rhinorrhea, sore throat, cough, chest pain, shortness of breath, rashes or lesions.  No known Covid exposures.  She does have a PICC line to her left femoral region.  She has had multiple line infections in the past.  She was previously followed by ID.  She is not currently on antibiotics.  Denies additional aggravating or alleviating factors.  History obtained from patient and past medical records.  No interpreter is used.  HPI     Past Medical History:  Diagnosis Date  . Acute pancreatitis 04/13/2020  . Anasarca 10/2019  . AVN (avascular necrosis of bone) ([place])   . Cataract   . Chronic pain syndrome   . CKD (chronic kidney disease), stage III   . Crohn disease ([place])   . Crohn disease ([place])   . Depression   . Diverticulosis   . GERD (gastroesophageal reflux disease)   . HTN (hypertension)   . IDA (iron deficiency anemia)   . Malnutrition ([place])   . Mass in chest   . Osteoporosis   . Pancreatitis   . Short gut syndrome   . Vitamin B12 deficiency     Patient Active Problem List    Diagnosis Date Noted  . Febrile illness 05/05/2020  . Acute pancreatitis 04/13/2020  . Intractable nausea and vomiting 04/13/2020  . Infection due to Stenotrophomonas maltophilia 03/10/2020  . Sepsis ([place]) 03/05/2020  . Abnormal LFTs 03/05/2020  . Pancytopenia ([place]) 03/05/2020  . Stage 3b chronic kidney disease 03/05/2020  . Bacteremia   . Infection of peripherally inserted central venous catheter (PICC)   . On total parenteral nutrition (TPN) 01/02/2020  . Elevated liver enzymes 01/02/2020  . Cholangitis   . Generalized abdominal pain   . Intractable abdominal pain 11/25/2019  . Anasarca 10/28/2019  . Acute kidney injury superimposed on chronic kidney disease ([place]) 10/28/2019  . Falls 10/28/2019  . Crohn disease ([place])   . Malnutrition ([place])   . Short gut syndrome   . HTN (hypertension)   . Vitamin B12 deficiency   . Osteoporosis   . Depression   . GERD (gastroesophageal reflux disease)   . Chronic pain syndrome   . Hypokalemia due to excessive gastrointestinal loss of potassium 10/13/2019  . Diarrhea   . Fever   . Hypokalemia 10/12/2019  . Chronic diarrhea 10/12/2019  . Dysuria 10/12/2019  . Bilateral lower extremity edema 10/12/2019  . Crohn's colitis ([place]) 10/12/2019  . Anemia 10/12/2019  . Hypertension 10/12/2019  . AKI (acute kidney injury) ([place]) 10/12/2019    Past Surgical History:  Procedure Laterality Date  .  ABDOMINAL HYSTERECTOMY    . ABDOMINAL SURGERY     colon resection with abdominal stoma, repair of intestinal leak, reversal of abdominal stoma  . BILIARY DILATION  11/26/2019   Procedure: BILIARY DILATION;  Surgeon: Jackquline Denmark, MD;  Location: [facility];  Service: Endoscopy;;  . BILIARY DILATION  03/08/2020   Procedure: BILIARY DILATION;  Surgeon: Irving Copas., MD;  Location: [facility];  Service: Gastroenterology;;  . BIOPSY  03/08/2020   Procedure: BIOPSY;  Surgeon: Irving Copas., MD;  Location: ;  Service:  Gastroenterology;;  . CHEST WALL RESECTION     right thoracotomy,resection of chest mass with anterior rib and reconstruction using prosthetic mesh and video arthroscopy  . CHOLECYSTECTOMY    . COLONOSCOPY  2019  . ERCP N/A 11/26/2019   Procedure: ENDOSCOPIC RETROGRADE CHOLANGIOPANCREATOGRAPHY (ERCP);  Surgeon: Jackquline Denmark, MD;  Location: [facility];  Service: Endoscopy;  Laterality: N/A;  . ERCP N/A 03/08/2020   Procedure: ENDOSCOPIC RETROGRADE CHOLANGIOPANCREATOGRAPHY (ERCP);  Surgeon: Irving Copas., MD;  Location: [facility];  Service: Gastroenterology;  Laterality: N/A;  . ESOPHAGOGASTRODUODENOSCOPY N/A 03/08/2020   Procedure: ESOPHAGOGASTRODUODENOSCOPY (EGD);  Surgeon: Irving Copas., MD;  Location: [facility];  Service: Gastroenterology;  Laterality: N/A;  . EUS N/A 03/08/2020   Procedure: UPPER ENDOSCOPIC ULTRASOUND (EUS) LINEAR;  Surgeon: Irving Copas., MD;  Location: [facility];  Service: Gastroenterology;  Laterality: N/A;  . IR FLUORO GUIDE CV LINE LEFT  01/07/2020  . IR FLUORO GUIDE CV LINE LEFT  03/09/2020  . IR PTA VENOUS EXCEPT DIALYSIS CIRCUIT  01/07/2020  . IR US GUIDE VASC ACCESS LEFT     x 2 06/17/19 and 09/14/2019  . KNEE SURGERY     right knee   . REMOVAL OF STONES  11/26/2019   Procedure: REMOVAL OF STONES;  Surgeon: Jackquline Denmark, MD;  Location: [facility];  Service: Endoscopy;;  . REMOVAL OF STONES  03/08/2020   Procedure: REMOVAL OF STONES;  Surgeon: Irving Copas., MD;  Location: [facility];  Service: Gastroenterology;;  . SMALL INTESTINE SURGERY     x3  . SPHINCTEROTOMY  11/26/2019   Procedure: SPHINCTEROTOMY;  Surgeon: Jackquline Denmark, MD;  Location: [facility];  Service: Endoscopy;;  . UPPER GASTROINTESTINAL ENDOSCOPY       OB History   No obstetric history on file.     Family History  Problem Relation Age of Onset  . Breast cancer Sister   . Multiple sclerosis Sister   . Diabetes Sister   . Lupus Sister    . Colon cancer Other   . Crohn's disease Other   . Seizures Mother   . Glaucoma Mother   . CAD Father   . Heart disease Father   . Hypertension Father     Social History   Tobacco Use  . Smoking status: Former Research scientist (life sciences)  . Smokeless tobacco: Never Used  Vaping Use  . Vaping Use: Never used  Substance Use Topics  . Alcohol use: Not Currently  . Drug use: Never    Home Medications Prior to Admission medications   Medication Sig Start Date End Date Taking? Authorizing Provider  acetaminophen (TYLENOL) 325 MG tablet Take 650 mg by mouth every 6 (six) hours as needed for mild pain.    Yes [provider]  amLODipine (NORVASC) 5 MG tablet Take 1 tablet (5 mg total) by mouth daily. 10/16/19  Yes [name], [name], MD  budesonide (ENTOCORT EC) 3 MG 24 hr capsule TAKE 3 CAPSULES  BY MOUTH ONCE DAILY Patient taking differently: Take 6 mg by mouth daily.  04/05/20  Yes [name], [name], MD  buPROPion [facility] SR) 100 MG 12 hr tablet Take 200 mg by mouth daily.    Yes [provider]  cholecalciferol (VITAMIN D3) 25 MCG (1000 UT) tablet Take 1,000 Units by mouth daily.    Yes [provider]  cycloSPORINE (RESTASIS) 0.05 % ophthalmic emulsion Place 1 drop into both eyes 2 (two) times daily.   Yes [provider]  dexlansoprazole (DEXILANT) 60 MG capsule Take 1 capsule (60 mg total) by mouth daily. 10/16/19  Yes [name], [name], MD  Dextran 70-Hypromellose 0.1-0.3 % SOLN Place 1 drop into both eyes 4 (four) times daily.   Yes [provider]  dicyclomine (BENTYL) 20 MG tablet Take 20 mg by mouth 3 (three) times daily. 10/26/19  Yes [provider]  diphenoxylate-atropine (LOMOTIL) 2.5-0.025 MG tablet Take 1 tablet by mouth 4 (four) times daily as needed for diarrhea or loose stools. 10/31/19  Yes Dessa Phi, DO  DULoxetine (CYMBALTA) 30 MG capsule Take 3 capsules (90 mg total) by mouth daily. Patient taking  differently: Take 90 mg by mouth daily. Takes along with 60 mg tablet (Total 90 mg) 11/01/19  Yes Dessa Phi, DO  DULoxetine (CYMBALTA) 60 MG capsule Take 60 mg by mouth daily. Takes along with 30 mg tablet (Total 90 mg) 12/09/19  Yes [provider]  estradiol (ESTRACE) 2 MG tablet Take 1 tablet (2 mg total) by mouth daily. 12/10/19  Yes [name], [name], MD  famotidine (PEPCID) 20 MG tablet Take 20 mg by mouth at bedtime.   Yes [provider]  HYDROmorphone (DILAUDID) 4 MG tablet Take 1 tablet (4 mg total) by mouth every 6 (six) hours as needed for severe pain. 12/06/19  Yes Ripley Fraise, MD  lipase/protease/amylase (CREON) 36000 UNITS CPEP capsule Take 36,000 Units by mouth 3 (three) times daily as needed (with meals for digestion).    Yes [provider]  loperamide (IMODIUM) 2 MG capsule TAKE 1 CAPSULE BY MOUTH   AS NEEDED FOR DIARRHEA OR  LOOSE  STOOLS Patient taking differently: Take 2 mg by mouth in the morning, at noon, in the evening, and at bedtime.  03/22/20  Yes [name], [name], MD  methadone (DOLOPHINE) 5 MG tablet Take 5 mg by mouth 5 (five) times daily.  03/29/20  Yes [provider]  metoprolol tartrate (LOPRESSOR) 100 MG tablet Take 1 tablet (100 mg total) by mouth 2 (two) times daily. 03/03/20  Yes [name], [name], MD  Multiple Vitamins-Minerals (MULTIVITAMIN ADULT PO) Take 1 tablet by mouth daily.   Yes [provider]  Omega-3 1000 MG CAPS Take 1,000 mg by mouth daily.    Yes [provider]  potassium chloride SA (KLOR-CON) 20 MEQ tablet Take 40 mEq by mouth daily.   Yes [provider]  PRESCRIPTION MEDICATION Inject 1 each into the vein daily. Home TPN . [employer] in [employer] . 1 bag for 16 hours.   Yes [provider]  Probiotic Product (PROBIOTIC-10 PO) Take 2 capsules by mouth daily.    Yes [provider]  promethazine (PHENERGAN) 25 MG tablet TAKE 1  TABLET BY MOUTH EVERY 6 HOURS AS NEEDED FOR NAUSEA AND VOMITING Patient taking differently: Take 25 mg by mouth every 6 (six) hours.  04/12/20  Yes [name], [name], MD  traZODone (DESYREL) 100 MG  tablet Take 2 tablets (200 mg total) by mouth at bedtime as needed for sleep. 12/31/19  Yes [name], [name], MD  vitamin B-12 (CYANOCOBALAMIN) 100 MCG tablet Take 1,000 mcg by mouth daily.    Yes [provider]  methadone (DOLOPHINE) 10 MG tablet Take 1 tablet (10 mg total) by mouth every 8 (eight) hours. Patient not taking: Reported on 05/05/2020 01/11/20   Modena Jansky, MD  sucralfate (CARAFATE) 1 GM/10ML suspension Take 10 mLs (1 g total) by mouth 4 (four) times daily -  with meals and at bedtime. Patient taking differently: Take 1 g by mouth 4 (four) times daily.  12/15/19   [name], [name], MD   Allergies    Cefepime, Gabapentin, Hyoscyamine, Lyrica [pregabalin], Meperidine, Topamax [topiramate], Fentanyl, and Morphine and related  Review of Systems   Review of Systems  Constitutional: Positive for appetite change, fatigue and fever.  HENT: Negative.   Respiratory: Negative.   Cardiovascular: Negative.   Gastrointestinal: Positive for abdominal pain, diarrhea (Chronic) and nausea. Negative for abdominal distention, anal bleeding, blood in stool, constipation, rectal pain and vomiting.  Genitourinary: Negative.   Musculoskeletal: Positive for myalgias. Negative for arthralgias, back pain, gait problem, joint swelling, neck pain and neck stiffness.  Skin: Negative.   Neurological: Positive for weakness (Generalized).  All other systems reviewed and are negative.  Physical Exam Updated Vital Signs BP 135/66 (BP Location: Right Arm)   Pulse 62   Temp 98.1 F (36.7 C) (Oral)   Resp 16   Ht 5' 8"  (1.727 m)   Wt 67.6 kg   SpO2 100%   BMI 22.66 kg/m   Physical Exam Vitals and nursing note reviewed.  Constitutional:      General: She is not in  acute distress.    Appearance: She is well-developed. She is ill-appearing (Chronically ill apearing). She is not toxic-appearing.  HENT:     Head: Normocephalic and atraumatic.     Nose: Nose normal.     Mouth/Throat:     Mouth: Mucous membranes are dry.  Eyes:     Pupils: Pupils are equal, round, and reactive to light.  Cardiovascular:     Rate and Rhythm: Normal rate.     Pulses: Normal pulses.     Heart sounds: Normal heart sounds.  Pulmonary:     Effort: Pulmonary effort is normal. No respiratory distress.     Breath sounds: Normal breath sounds.  Abdominal:     General: Bowel sounds are normal. There is no distension.     Palpations: Abdomen is soft.     Tenderness: There is abdominal tenderness in the right lower quadrant, suprapubic area and left lower quadrant. There is no right CVA tenderness, left CVA tenderness, guarding or rebound. Negative signs include Murphy's sign and McBurney's sign.     Hernia: No hernia is present.     Comments: Diffuse tenderness throughout abdomen without rebound or guarding  Musculoskeletal:        General: Normal range of motion.     Cervical back: Normal range of motion.     Comments: Moves all 4 extremities without difficulty.  PICC line to left femoral region.  Skin:    General: Skin is warm and dry.     Capillary Refill: Capillary refill takes less than 2 seconds.     Comments: PICC to left femoral region without surrounding erythema, warmth, infectious process  Neurological:     Mental Status: She is alert.  Cranial Nerves: Cranial nerves are intact.     Sensory: Sensation is intact.     Motor: Motor function is intact.     Coordination: Coordination is intact.     Gait: Gait is intact.     Comments: CN 2 through 12 grossly intact Ambulatory with out difficulty     ED Results / Procedures / Treatments   Labs (all labs ordered are listed, but only abnormal results are displayed) Labs Reviewed  COMPREHENSIVE METABOLIC PANEL  - Abnormal; Notable for the following components:      Result Value   Potassium 3.2 (*)    BUN 50 (*)    Creatinine, Ser 1.88 (*)    Calcium 8.5 (*)    Albumin 3.3 (*)    AST 637 (*)    ALT 604 (*)    Alkaline Phosphatase 187 (*)    GFR calc non Af Amer 29 (*)    GFR calc Af Amer 33 (*)    All other components within normal limits  CBC WITH DIFFERENTIAL/PLATELET - Abnormal; Notable for the following components:   RBC 2.78 (*)    Hemoglobin 8.7 (*)    HCT 26.6 (*)    Platelets 123 (*)    Lymphs Abs 0.3 (*)    All other components within normal limits  SARS CORONAVIRUS 2 BY RT PCR (HOSPITAL ORDER, [facility])  CULTURE, BLOOD (ROUTINE X 2)  CULTURE, BLOOD (ROUTINE X 2)  LACTIC ACID, PLASMA  URINALYSIS, ROUTINE W REFLEX MICROSCOPIC  HEPATITIS PANEL, ACUTE  ACETAMINOPHEN LEVEL  MAGNESIUM    EKG None  Radiology CT Abdomen Pelvis Wo Contrast  Result Date: 05/05/2020 CLINICAL DATA:  Severe abdominal pain.  Fever.  Crohn disease. EXAM: CT ABDOMEN AND PELVIS WITHOUT CONTRAST TECHNIQUE: Multidetector CT imaging of the abdomen and pelvis was performed following the standard protocol without IV contrast. COMPARISON:  04/13/2020 FINDINGS: Lower chest: No acute findings. Hepatobiliary: No mass visualized on this unenhanced exam. Prior cholecystectomy. Pneumobilia again seen, without evidence of biliary obstruction. Pancreas: No mass or inflammatory process visualized on this unenhanced exam. Diffuse pancreatic ductal dilatation is demonstrated. Spleen:  Within normal limits in size. Adrenals/Urinary tract: No evidence of urolithiasis or hydronephrosis. Unremarkable unopacified urinary bladder. Stomach/Bowel: Stable postop changes from previous bowel resection. No evidence of obstruction, inflammatory process, or abnormal fluid collections. Vascular/Lymphatic: No pathologically enlarged lymph nodes identified. No evidence of abdominal aortic aneurysm. Left femoral vein  catheter remains in place with tip in the IVC. Reproductive: Prior hysterectomy noted. Adnexal regions are unremarkable in appearance. Other:  None. Musculoskeletal:  No suspicious bone lesions identified. IMPRESSION: Stable exam. No evidence of active Crohn disease or other acute findings. Electronically Signed   By: Marlaine Hind M.D.   On: 05/05/2020 17:49    Procedures Procedures (including critical care time)  Medications Ordered in ED Medications  aztreonam (AZACTAM) 2 g in sodium chloride 0.9 % 100 mL IVPB (2 g Intravenous New Bag/Given 05/05/20 2056)  metroNIDAZOLE (FLAGYL) IVPB 500 mg (500 mg Intravenous New Bag/Given 05/05/20 2054)  vancomycin (VANCOREADY) IVPB 1500 mg/300 mL (has no administration in time range)  acetaminophen (TYLENOL) tablet 1,000 mg (1,000 mg Oral Given 05/05/20 1543)  sodium chloride 0.9 % bolus 1,000 mL (0 mLs Intravenous Stopped 05/05/20 1903)  ondansetron (ZOFRAN) injection 4 mg (4 mg Intravenous Given 05/05/20 1633)  HYDROmorphone (DILAUDID) injection 1 mg (1 mg Intravenous Given 05/05/20 1633)  potassium chloride 10 mEq in 100 mL IVPB (0 mEq  Intravenous Stopped 05/05/20 1903)  sodium chloride 0.9 % bolus 1,000 mL (1,000 mLs Intravenous New Bag/Given 05/05/20 1907)  fentaNYL (SUBLIMAZE) injection 50 mcg (50 mcg Intravenous Given 05/05/20 1907)    ED Course  I have reviewed the triage vital signs and the nursing notes.  Pertinent labs & imaging results that were available during my care of the patient were reviewed by me and considered in my medical decision making (see chart for details).  60 year old female chronically ill presents for evaluation of fever and abdominal pain.  She is febrile here however has no tachycardia, tachypnea or hypoxia.  She does not appear septic.  She has a PICC line to her left femoral region which she receives chronic daily pain medicines as well as IV fluids.  History of short gut, colitis.  No upper respiratory symptoms.  Heart and  lungs clear.  No rashes or lesions.  Plan on labs, imaging, reassess.  Patient does have history of recurrent bacteremia due to recurrent PICC line infections however she does not meet sepsis criteria currently.  She is no tachycardia, tachypnea, hypoxia.  Will hold on antibiotics and calling code sepsis at this time.  Labs and imaging personally viewed and interpreted: CBC without leukocytosis, hemoglobin 8.7 similar to prior Metabolic panel with mild hypokalemia to 3.2 will give replacement, creatinine 1.88, up from prior labs here however per [facility] this is improvement.  She has elevated LFTs without elevation or bilirubin.  Her alk phos is actually decreased from her prior labs however her AST and ALT are elevated from previous.  She is post cholecystectomy. Lactic acid 0.8 Blood cultures obtained and pending Urinalysis negative Covid negative CT abdomen pelvis without acute findings.    1800: Patient reassessed.  Continues to have 9/10 pain.  Will give additional pain medicine.  Has had some soft blood pressures however maps greater than 65.  Given fluids.  1945: Patient reassessed.  Continuing to need additional pain medication.  Patient has defervesced with Tylenol.  Discussed her elevated LFTs which patient states is chronic.  I do see some elevation with her GI at [facility] however her AST and ALT are significantly elevated into the 600s.  She has no upper abdominal pain.  CT although without contrast does not show evidence of duct dilation.  She is post cholecystectomy.  We will add on Tylenol, hepatitis panel.  Low suspicion for cholangitis, choledocholithiasis.  No upper respiratory symptoms, urinalysis negative, no rashes or lesions to suggest additional source of infection.  Patient does not been able to provide stool sample to obtain to rule out C. difficile as cause of her diarrhea however patient states her diarrhea is chronic and at baseline.  Last antibiotic use was IV Bactrim greater than 1  month ago. No recent travel, sick contacts.  Patient does not appear septic however has history of recurrent bacteremia due to PICC line infections.  Given this lactic acid and blood cultures were obtained.  Will start on broad-spectrum antibiotics per attending, Dr. Billy Fischer.  Will admit for intractable abdominal pain as well as follow-up on cultures to rule out sepsis.  On repeat exam patient does not have a surgical abdomin and there are no peritoneal signs.  No indication of appendicitis, bowel obstruction, bowel perforation, cholecystitis, diverticulitis, PID or ectopic pregnancy.   Consult with Dr. Posey Pronto with [facility] to evaluate patient for admission.   Patient discussed with attending, Dr. Billy Fischer who agrees above treatment, plan disposition.  The patient appears reasonably stabilized for admission  considering the current resources, flow, and capabilities available in the ED at this time, and I doubt any other [facility] requiring further screening and/or treatment in the ED prior to admission.    MDM Rules/Calculators/A&P                           Final Clinical Impression(s) / ED Diagnoses Final diagnoses:  Lower abdominal pain  Fever, unspecified fever cause  Hx of bacteremia  Hx of Crohn's disease  Short gut syndrome  Hypokalemia  Acute renal failure superimposed on chronic kidney disease, unspecified CKD stage, unspecified acute renal failure type [facility])    Rx / DC Orders ED Discharge Orders    None       Shaheem Pichon A, PA-C 05/05/20 2105    Gareth Morgan, MD 05/06/20 1654

## 2020-05-05 NOTE — ED Triage Notes (Addendum)
Patient reports her home health nurse came to check on her PICC line today and temp was 102.  Patient c/o body aches and pain.  C/O abdominal pain that is worse than normal  9/10 pain   Wheelchair in triage.   PICC line placed last October per patient   Hx. Chron's

## 2020-05-05 NOTE — Progress Notes (Signed)
A consult was received from an ED provider for Vancomycin and Aztreonam per pharmacy dosing.  [name] profile has been reviewed for ht/wt/allergies/indication/available labs.    A one time order has been placed for Vancomycin 1561m IV and Aztreonam 2g IV.  Further antibiotics/pharmacy consults should be ordered by admitting physician if indicated.                       Thank you, GLuiz Ochoa6/08/2020  8:47 PM

## 2020-05-06 ENCOUNTER — Inpatient Hospital Stay (HOSPITAL_COMMUNITY): Payer: Medicare Other

## 2020-05-06 ENCOUNTER — Other Ambulatory Visit: Payer: Self-pay

## 2020-05-06 DIAGNOSIS — Z832 Family history of diseases of the blood and blood-forming organs and certain disorders involving the immune mechanism: Secondary | ICD-10-CM | POA: Diagnosis not present

## 2020-05-06 DIAGNOSIS — I10 Essential (primary) hypertension: Secondary | ICD-10-CM

## 2020-05-06 DIAGNOSIS — R7881 Bacteremia: Principal | ICD-10-CM

## 2020-05-06 DIAGNOSIS — N179 Acute kidney failure, unspecified: Secondary | ICD-10-CM

## 2020-05-06 DIAGNOSIS — Z803 Family history of malignant neoplasm of breast: Secondary | ICD-10-CM | POA: Diagnosis not present

## 2020-05-06 DIAGNOSIS — B965 Pseudomonas (aeruginosa) (mallei) (pseudomallei) as the cause of diseases classified elsewhere: Secondary | ICD-10-CM | POA: Diagnosis present

## 2020-05-06 DIAGNOSIS — Z9049 Acquired absence of other specified parts of digestive tract: Secondary | ICD-10-CM | POA: Diagnosis not present

## 2020-05-06 DIAGNOSIS — D631 Anemia in chronic kidney disease: Secondary | ICD-10-CM | POA: Diagnosis present

## 2020-05-06 DIAGNOSIS — K912 Postsurgical malabsorption, not elsewhere classified: Secondary | ICD-10-CM

## 2020-05-06 DIAGNOSIS — R509 Fever, unspecified: Secondary | ICD-10-CM | POA: Diagnosis present

## 2020-05-06 DIAGNOSIS — K5090 Crohn's disease, unspecified, without complications: Secondary | ICD-10-CM | POA: Diagnosis present

## 2020-05-06 DIAGNOSIS — Z789 Other specified health status: Secondary | ICD-10-CM

## 2020-05-06 DIAGNOSIS — Z833 Family history of diabetes mellitus: Secondary | ICD-10-CM | POA: Diagnosis not present

## 2020-05-06 DIAGNOSIS — K50819 Crohn's disease of both small and large intestine with unspecified complications: Secondary | ICD-10-CM | POA: Diagnosis not present

## 2020-05-06 DIAGNOSIS — D509 Iron deficiency anemia, unspecified: Secondary | ICD-10-CM | POA: Diagnosis present

## 2020-05-06 DIAGNOSIS — Z820 Family history of epilepsy and other diseases of the nervous system: Secondary | ICD-10-CM | POA: Diagnosis not present

## 2020-05-06 DIAGNOSIS — Z79890 Hormone replacement therapy (postmenopausal): Secondary | ICD-10-CM | POA: Diagnosis not present

## 2020-05-06 DIAGNOSIS — R945 Abnormal results of liver function studies: Secondary | ICD-10-CM

## 2020-05-06 DIAGNOSIS — K8689 Other specified diseases of pancreas: Secondary | ICD-10-CM | POA: Diagnosis present

## 2020-05-06 DIAGNOSIS — N1831 Chronic kidney disease, stage 3a: Secondary | ICD-10-CM | POA: Diagnosis present

## 2020-05-06 DIAGNOSIS — E876 Hypokalemia: Secondary | ICD-10-CM

## 2020-05-06 DIAGNOSIS — F3341 Major depressive disorder, recurrent, in partial remission: Secondary | ICD-10-CM

## 2020-05-06 DIAGNOSIS — N189 Chronic kidney disease, unspecified: Secondary | ICD-10-CM

## 2020-05-06 DIAGNOSIS — Z8249 Family history of ischemic heart disease and other diseases of the circulatory system: Secondary | ICD-10-CM | POA: Diagnosis not present

## 2020-05-06 DIAGNOSIS — K219 Gastro-esophageal reflux disease without esophagitis: Secondary | ICD-10-CM | POA: Diagnosis present

## 2020-05-06 DIAGNOSIS — Z79891 Long term (current) use of opiate analgesic: Secondary | ICD-10-CM | POA: Diagnosis not present

## 2020-05-06 DIAGNOSIS — I129 Hypertensive chronic kidney disease with stage 1 through stage 4 chronic kidney disease, or unspecified chronic kidney disease: Secondary | ICD-10-CM | POA: Diagnosis present

## 2020-05-06 DIAGNOSIS — Z87891 Personal history of nicotine dependence: Secondary | ICD-10-CM | POA: Diagnosis not present

## 2020-05-06 DIAGNOSIS — Z79899 Other long term (current) drug therapy: Secondary | ICD-10-CM | POA: Diagnosis not present

## 2020-05-06 DIAGNOSIS — Z20822 Contact with and (suspected) exposure to covid-19: Secondary | ICD-10-CM | POA: Diagnosis present

## 2020-05-06 LAB — HEPATITIS PANEL, ACUTE
HCV Ab: NONREACTIVE
Hep A IgM: NONREACTIVE
Hep B C IgM: NONREACTIVE
Hepatitis B Surface Ag: NONREACTIVE

## 2020-05-06 LAB — BLOOD CULTURE ID PANEL (REFLEXED)

## 2020-05-06 LAB — CBC
HCT: 25.3 % — ABNORMAL LOW (ref 36.0–46.0)
Hemoglobin: 8.2 g/dL — ABNORMAL LOW (ref 12.0–15.0)
MCH: 31.7 pg (ref 26.0–34.0)
MCHC: 32.4 g/dL (ref 30.0–36.0)
MCV: 97.7 fL (ref 80.0–100.0)
Platelets: 102 10*3/uL — ABNORMAL LOW (ref 150–400)
RBC: 2.59 MIL/uL — ABNORMAL LOW (ref 3.87–5.11)
RDW: 13.9 % (ref 11.5–15.5)
WBC: 10.5 10*3/uL (ref 4.0–10.5)
nRBC: 0 % (ref 0.0–0.2)

## 2020-05-06 LAB — COMPREHENSIVE METABOLIC PANEL
ALT: 640 U/L — ABNORMAL HIGH (ref 0–44)
AST: 442 U/L — ABNORMAL HIGH (ref 15–41)
Albumin: 3.2 g/dL — ABNORMAL LOW (ref 3.5–5.0)
Alkaline Phosphatase: 186 U/L — ABNORMAL HIGH (ref 38–126)
Anion gap: 9 (ref 5–15)
BUN: 45 mg/dL — ABNORMAL HIGH (ref 6–20)
CO2: 25 mmol/L (ref 22–32)
Calcium: 8.6 mg/dL — ABNORMAL LOW (ref 8.9–10.3)
Chloride: 103 mmol/L (ref 98–111)
Creatinine, Ser: 1.64 mg/dL — ABNORMAL HIGH (ref 0.44–1.00)
GFR calc Af Amer: 39 mL/min — ABNORMAL LOW (ref >60)
GFR calc non Af Amer: 34 mL/min — ABNORMAL LOW (ref >60)
Glucose, Bld: 77 mg/dL (ref 70–99)
Potassium: 4 mmol/L (ref 3.5–5.1)
Sodium: 137 mmol/L (ref 135–145)
Total Bilirubin: 1.1 mg/dL (ref 0.3–1.2)
Total Protein: 6.6 g/dL (ref 6.5–8.1)

## 2020-05-06 MED ORDER — PIPERACILLIN-TAZOBACTAM 3.375 G IVPB
3.3750 g | Freq: Three times a day (TID) | INTRAVENOUS | Status: DC
  Administered 2020-05-06 – 2020-05-10 (×12): 3.375 g via INTRAVENOUS
  Filled 2020-05-06 (×13): qty 50

## 2020-05-06 MED ORDER — PIPERACILLIN-TAZOBACTAM 3.375 G IVPB
3.3750 g | Freq: Two times a day (BID) | INTRAVENOUS | Status: DC
  Administered 2020-05-06: 3.375 g via INTRAVENOUS
  Filled 2020-05-06: qty 50

## 2020-05-06 MED ORDER — CHLORHEXIDINE GLUCONATE CLOTH 2 % EX PADS
6.0000 | MEDICATED_PAD | Freq: Every day | CUTANEOUS | Status: DC
  Administered 2020-05-06 – 2020-05-10 (×4): 6 via TOPICAL

## 2020-05-06 NOTE — Progress Notes (Signed)
Initial Nutrition Assessment  INTERVENTION:   -Resume cyclic TPN if admitted for >24 hours  NUTRITION DIAGNOSIS:   Increased nutrient needs related to chronic illness (short gut syndrome) as evidenced by estimated needs.  GOAL:   Patient will meet greater than or equal to 90% of their needs  MONITOR:   PO intake, Supplement acceptance, Labs, Weight trends, I & O's (TPN)  REASON FOR ASSESSMENT:   Malnutrition Screening Tool    ASSESSMENT:   59 y.o. 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  **RD working remotely**  Patient familiar to this RD given recent admission in May. Pt on cyclic TPN at home. Pt's home diet consists of fruit smoothies, toast and sometimes has tacos. Pt does not use protein supplements at home.  Pt was admitted for fever. Pt not consuming any PO at this time to "rest her gut". Would resume TPN if pt is expected to remain at hospital for >24 hours.  Per weight records, weight has remained stable.  Labs reviewed. Medications: KLOR-CON, Carafate, Lactated ringers, IV Zofran, IV Phenergan  NUTRITION - FOCUSED PHYSICAL EXAM:  RD working remotely. Pt was seen during previous admission in May, no depletions noted during that assessment.  Diet Order:   Diet Order            Diet NPO time specified Except for: Sips with Meds  Diet effective now                 EDUCATION NEEDS:   No education needs have been identified at this time  Skin:  Skin Assessment: Reviewed RN Assessment  Last BM:  6/11 -type 7  Height:   Ht Readings from Last 1 Encounters:  05/05/20 5' 8"  (1.727 m)    Weight:   Wt Readings from Last 1 Encounters:   05/05/20 67.6 kg    Ideal Body Weight:     BMI:  Body mass index is 22.66 kg/m.  Estimated Nutritional Needs:   Kcal:  2000-2200  Protein:  100-115g  Fluid:  2L/day  Clayton Bibles, MS, RD, LDN Inpatient Clinical Dietitian Contact information available via Amion

## 2020-05-06 NOTE — Progress Notes (Signed)
PROGRESS NOTE    Caitlin Peters  QZE:[id number] DOB: Mar 06, 1961 DOA: 05/05/2020 PCP: [name], [name], MD   Brief Narrative:   Caitlin Peters is a 59 y.o. 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  Patient reports multiple hospitalizations for recurrent Crohn's disease related abdominal pain and frequent PICC line infections.  She is most recently hospitalized from 04/13/2020-04/18/2020 for significant nausea, vomiting, diarrhea, and abdominal pain.  She was managed with supportive care and discharged home.  Patient states she has been managing fairly well since then until earlier today (05/05/2020) when she developed a fever up to 102 Fahrenheit.  She had associated chills and body aches.  She has continued nausea and abdominal pain however feels this is less severe and less frequent than when she was hospitalized last time.  She feels that her left femoral PICC line is functioning well and has not seen any skin changes or obvious wounds.  She denies any associated cough, chest pain, dyspnea, dysuria.  She reports chronic diarrhea which is becoming less frequent and only occurring up to 3 times per day now.  She feels that she is not able to maintain adequate oral intake at this time.  6/11: Bld Cx w/ gram variable rods. Change abx to zosyn. Awaiting final culture results. Check c diff, GI PCR. Check RUQ [place] for LFTs.   Assessment & Plan:   Principal Problem:   Febrile illness Active Problems:   Hypokalemia   Crohn disease ([place])   Short gut syndrome   HTN (hypertension)   Depression   Acute kidney injury superimposed on chronic kidney disease ([name])   On total  parenteral nutrition (TPN)   Abnormal LFTs   Febrile illness, acute  Febrile illness Bacteremia     - gram variable bacteremia     - continue zosyn for now     - fever is improved     - ?Line holiday  Extensive Crohn's disease s/p multiple bowel resections and resultant short bowel syndrome now TPN dependent with chronic abdominal pain:     - Follows with [facility], Dr. Domenica Fail.     - Keep n.p.o. for now except sips with meds     - Continue holding TPN for now     - Continue pain control with as needed oral tramadol and IV Dilaudid 2 mg q3h only for severe pain with hold parameters     - LR at 125cc/hr, follow  AKI on CKD stage III:     - Creatinine 1.88 on admission compared to 1.26 on 04/18/2020.     - SCr down to 1.64; continue LR  Abnormal LFTs:     - at admission: AST 637, ALT 604, alk phos 187 with total bilirubin 0.9.       - CT abdomen/pelvis shows stable chronic changes without acute findings.     - Hepatitis panel is negative     - RUQ [place] pending  Chronic pancreatic duct dilatation:     - Seen again on CT abdomen/pelvis and appears stable.  Hypokalemia     - resolved, check Mg2+  Hypertension:     - Continue amlodipine and Lopressor.     - BP is acceptable  Anemia  of chronic disease and iron deficiency:     - no evidence of bleed, monitor  Depression:     - continue Wellbutrin and Cymbalta.  Chronic pain:     - continue tramadol and IV Dilaudid as needed with hold parameters as above.  DVT prophylaxis: heparin Code Status: FULL   Status is: Inpatient  Remains inpatient appropriate because:IV treatments appropriate due to intensity of illness or inability to take PO   Dispo: The patient is from: Home              Anticipated d/c is to: Home              Anticipated d/c date is: 3 days              Patient currently is not medically stable to d/c.  Consultants:   None  Antimicrobials:  . zosyn   ROS:  Reports D, N, ab pain . Remainder 10-pt  ROS is negative for all not previously mentioned.  Subjective: "It happens all the time. Sometimes it's just my gut."  Objective: Vitals:   05/06/20 0228 05/06/20 0616 05/06/20 1026 05/06/20 1600  BP: 136/60 (!) 131/57 132/76 (!) 148/76  Pulse: 86 68 61 (!) 58  Resp: 17 16 16 16   Temp: 98.3 F (36.8 C) 98.5 F (36.9 C) 99.1 F (37.3 C) 98.3 F (36.8 C)  TempSrc: Oral Oral Oral Oral  SpO2: 100% 97% 94% 97%  Weight:      Height:        Intake/Output Summary (Last 24 hours) at 05/06/2020 1645 Last data filed at 05/06/2020 1500 Gross per 24 hour  Intake 2927.03 ml  Output --  Net 2927.03 ml   Filed Weights   05/05/20 2039  Weight: 67.6 kg    Examination:  General: 59 y.o. female resting in bed in NAD Cardiovascular: RRR, +S1, S2, no m/g/r, equal pulses throughout Respiratory: CTABL, no w/r/r, normal WOB GI: BS+, ND, TTP epigastric region no masses noted, no organomegaly noted MSK: No e/c/c Neuro: A&O x 3, no focal deficits Psyc: Appropriate interaction and affect, calm/cooperative   Data Reviewed: I have personally reviewed following labs and imaging studies.  CBC: Recent Labs  Lab 05/05/20 1539 05/06/20 0525  WBC 6.0 10.5  NEUTROABS 5.4  --   HGB 8.7* 8.2*  HCT 26.6* 25.3*  MCV 95.7 97.7  PLT 123* 967*   Basic Metabolic Panel: Recent Labs  Lab 05/05/20 1539 05/06/20 0525  NA 138 137  K 3.2* 4.0  CL 102 103  CO2 28 25  GLUCOSE 85 77  BUN 50* 45*  CREATININE 1.88* 1.64*  CALCIUM 8.5* 8.6*  MG 2.1  --    GFR: Estimated Creatinine Clearance: 37.3 mL/min (A) (by C-G formula based on SCr of 1.64 mg/dL (H)). Liver Function Tests: Recent Labs  Lab 05/05/20 1539 05/06/20 0525  AST 637* 442*  ALT 604* 640*  ALKPHOS 187* 186*  BILITOT 0.9 1.1  PROT 6.7 6.6  ALBUMIN 3.3* 3.2*   Recent Labs  Lab 05/05/20 2119  LIPASE 19   No results for input(s): AMMONIA in the last 168 hours. Coagulation Profile: No results for input(s): INR, PROTIME in  the last 168 hours. Cardiac Enzymes: No results for input(s): CKTOTAL, CKMB, CKMBINDEX, TROPONINI in the last 168 hours. BNP (last 3 results) No results for input(s): PROBNP in the last 8760 hours. HbA1C: No results for input(s): HGBA1C in the last 72 hours. CBG: No results for input(s): GLUCAP in  the last 168 hours. Lipid Profile: No results for input(s): CHOL, HDL, LDLCALC, TRIG, CHOLHDL, LDLDIRECT in the last 72 hours. Thyroid Function Tests: No results for input(s): TSH, T4TOTAL, FREET4, T3FREE, THYROIDAB in the last 72 hours. Anemia Panel: No results for input(s): VITAMINB12, FOLATE, FERRITIN, TIBC, IRON, RETICCTPCT in the last 72 hours. Sepsis Labs: Recent Labs  Lab 05/05/20 1539  LATICACIDVEN 0.8    Recent Results (from the past 240 hour(s))  SARS Coronavirus 2 by RT PCR (hospital order, performed in [facility] hospital lab) Nasopharyngeal Nasopharyngeal Swab     Status: None   Collection Time: 05/05/20  3:23 PM   Specimen: Nasopharyngeal Swab  Result Value Ref Range Status   SARS Coronavirus 2 NEGATIVE NEGATIVE Final    Comment: (NOTE) SARS-CoV-2 target nucleic acids are NOT DETECTED.  The SARS-CoV-2 RNA is generally detectable in upper and lower respiratory specimens during the acute phase of infection. The lowest concentration of SARS-CoV-2 viral copies this assay can detect is 250 copies / mL. A negative result does not preclude SARS-CoV-2 infection and should not be used as the sole basis for treatment or other patient management decisions.  A negative result may occur with improper specimen collection / handling, submission of specimen other than nasopharyngeal swab, presence of viral mutation(s) within the areas targeted by this assay, and inadequate number of viral copies (<250 copies / mL). A negative result must be combined with clinical observations, patient history, and epidemiological information.  Fact Sheet for Patients:    StrictlyIdeas.no  Fact Sheet for Healthcare Providers: BankingDealers.co.za  This test is not yet approved or  cleared by the [place] FDA and has been authorized for detection and/or diagnosis of SARS-CoV-2 by FDA under an Emergency Use Authorization (EUA).  This EUA will remain in effect (meaning this test can be used) for the duration of the COVID-19 declaration under Section 564(b)(1) of the Act, 21 U.S.C. section 360bbb-3(b)(1), unless the authorization is terminated or revoked sooner.  Performed at [facility], [place] [address], [place], [place] [address]   Blood culture (routine x 2)     Status: Abnormal (Preliminary result)   Collection Time: 05/05/20  3:39 PM   Specimen: BLOOD  Result Value Ref Range Status   Specimen Description   Final    BLOOD PICC LINE Performed at [facility], [place] [address], [place], [place] [address]    Special Requests   Final    BOTTLES DRAWN AEROBIC AND ANAEROBIC Blood Culture adequate volume Performed at [place] [address] [place], [place] [address]    Culture  Setup Time (A)  Final    GRAM VARIABLE ROD IN BOTH AEROBIC AND ANAEROBIC BOTTLES CRITICAL RESULT CALLED TO, READ BACK BY AND VERIFIED WITH: PHARMD M HICKS [id number] AT 920 AM BY CM    Culture   Final    NO GROWTH < 24 HOURS Performed at [facility], [place] [address], [place], [place] [address]    Report Status PENDING  Incomplete  Blood Culture ID Panel (Reflexed)     Status: None   Collection Time: 05/05/20  3:39 PM  Result Value Ref Range Status   Enterococcus species NOT DETECTED NOT DETECTED Final   Listeria monocytogenes NOT DETECTED NOT DETECTED Final   Staphylococcus species NOT DETECTED NOT DETECTED Final   Staphylococcus aureus (BCID) NOT DETECTED NOT DETECTED Final   Streptococcus species NOT DETECTED NOT DETECTED Final   Streptococcus agalactiae  NOT  DETECTED NOT DETECTED Final   Streptococcus pneumoniae NOT DETECTED NOT DETECTED Final   Streptococcus pyogenes NOT DETECTED NOT DETECTED Final   Acinetobacter baumannii NOT DETECTED NOT DETECTED Final   Enterobacteriaceae species NOT DETECTED NOT DETECTED Final   Enterobacter cloacae complex NOT DETECTED NOT DETECTED Final   Escherichia coli NOT DETECTED NOT DETECTED Final   Klebsiella oxytoca NOT DETECTED NOT DETECTED Final   Klebsiella pneumoniae NOT DETECTED NOT DETECTED Final   Proteus species NOT DETECTED NOT DETECTED Final   Serratia marcescens NOT DETECTED NOT DETECTED Final   Haemophilus influenzae NOT DETECTED NOT DETECTED Final   Neisseria meningitidis NOT DETECTED NOT DETECTED Final   Pseudomonas aeruginosa NOT DETECTED NOT DETECTED Final   Candida albicans NOT DETECTED NOT DETECTED Final   Candida glabrata NOT DETECTED NOT DETECTED Final   Candida krusei NOT DETECTED NOT DETECTED Final   Candida parapsilosis NOT DETECTED NOT DETECTED Final   Candida tropicalis NOT DETECTED NOT DETECTED Final    Comment: Performed at [facility], [place] [address], [place], [place] [address]  Blood culture (routine x 2)     Status: Abnormal (Preliminary result)   Collection Time: 05/05/20  3:55 PM   Specimen: BLOOD  Result Value Ref Range Status   Specimen Description   Final    BLOOD PICC LINE Performed at [facility], [place] [address] [place], [place] [address]    Special Requests   Final    BOTTLES DRAWN AEROBIC AND ANAEROBIC Blood Culture adequate volume Performed at [place] [address] [place], [place] [address]    Culture  Setup Time GRAM VARIABLE ROD AEROBIC BOTTLE ONLY  (A)  Final   Culture   Final    NO GROWTH < 24 HOURS Performed at [facility], [place] [address], [place], [place] [address]    Report Status PENDING  Incomplete      Radiology Studies: CT Abdomen Pelvis Wo Contrast  Result Date:  05/05/2020 CLINICAL DATA:  Severe abdominal pain.  Fever.  Crohn disease. EXAM: CT ABDOMEN AND PELVIS WITHOUT CONTRAST TECHNIQUE: Multidetector CT imaging of the abdomen and pelvis was performed following the standard protocol without IV contrast. COMPARISON:  04/13/2020 FINDINGS: Lower chest: No acute findings. Hepatobiliary: No mass visualized on this unenhanced exam. Prior cholecystectomy. Pneumobilia again seen, without evidence of biliary obstruction. Pancreas: No mass or inflammatory process visualized on this unenhanced exam. Diffuse pancreatic ductal dilatation is demonstrated. Spleen:  Within normal limits in size. Adrenals/Urinary tract: No evidence of urolithiasis or hydronephrosis. Unremarkable unopacified urinary bladder. Stomach/Bowel: Stable postop changes from previous bowel resection. No evidence of obstruction, inflammatory process, or abnormal fluid collections. Vascular/Lymphatic: No pathologically enlarged lymph nodes identified. No evidence of abdominal aortic aneurysm. Left femoral vein catheter remains in place with tip in the IVC. Reproductive: Prior hysterectomy noted. Adnexal regions are unremarkable in appearance. Other:  None. Musculoskeletal:  No suspicious bone lesions identified. IMPRESSION: Stable exam. No evidence of active Crohn disease or other acute findings. Electronically Signed   By: Marlaine Hind M.D.   On: 05/05/2020 17:49   DG Chest 2 View  Result Date: 05/05/2020 CLINICAL DATA:  Fever. EXAM: CHEST - 2 VIEW COMPARISON:  March 05, 2020 FINDINGS: There is no evidence of acute infiltrate, pleural effusion or pneumothorax. Surgical clips are seen along the right perihilar region and right lung base. The heart size and mediastinal contours are within normal limits. The visualized skeletal structures are unremarkable. IMPRESSION: No  active cardiopulmonary disease. Electronically Signed   By: Virgina Norfolk M.D.   On: 05/05/2020 21:43     Scheduled Meds: . amLODipine  5  mg Oral Daily  . budesonide  6 mg Oral Daily  . buPROPion  200 mg Oral Daily  . Chlorhexidine Gluconate Cloth  6 each Topical Q0600  . cycloSPORINE  1 drop Both Eyes BID  . dicyclomine  20 mg Oral TID  . DULoxetine  90 mg Oral Daily  . heparin  5,000 Units Subcutaneous Q8H  . metoprolol tartrate  100 mg Oral BID  . pantoprazole  40 mg Oral Daily  . polyvinyl alcohol  1 drop Both Eyes QID  . potassium chloride SA  40 mEq Oral Daily  . sucralfate  1 g Oral QID   Continuous Infusions: . lactated ringers with kcl 125 mL/hr at 05/06/20 1047  . piperacillin-tazobactam (ZOSYN)  IV       LOS: 0 days    Time spent: 25 minutes spent in the coordination of care today.    Jonnie Finner, DO [facility]  If 7PM-7AM, please contact night-coverage [email or web address] 05/06/2020, 4:45 PM

## 2020-05-07 DIAGNOSIS — B965 Pseudomonas (aeruginosa) (mallei) (pseudomallei) as the cause of diseases classified elsewhere: Secondary | ICD-10-CM

## 2020-05-07 LAB — COMPREHENSIVE METABOLIC PANEL
ALT: 441 U/L — ABNORMAL HIGH (ref 0–44)
AST: 171 U/L — ABNORMAL HIGH (ref 15–41)
Albumin: 3.1 g/dL — ABNORMAL LOW (ref 3.5–5.0)
Alkaline Phosphatase: 163 U/L — ABNORMAL HIGH (ref 38–126)
Anion gap: 9 (ref 5–15)
BUN: 29 mg/dL — ABNORMAL HIGH (ref 6–20)
CO2: 23 mmol/L (ref 22–32)
Calcium: 9.2 mg/dL (ref 8.9–10.3)
Chloride: 106 mmol/L (ref 98–111)
Creatinine, Ser: 1.63 mg/dL — ABNORMAL HIGH (ref 0.44–1.00)
GFR calc Af Amer: 40 mL/min — ABNORMAL LOW (ref >60)
GFR calc non Af Amer: 34 mL/min — ABNORMAL LOW (ref >60)
Glucose, Bld: 76 mg/dL (ref 70–99)
Potassium: 4.4 mmol/L (ref 3.5–5.1)
Sodium: 138 mmol/L (ref 135–145)
Total Bilirubin: 1.1 mg/dL (ref 0.3–1.2)
Total Protein: 6.6 g/dL (ref 6.5–8.1)

## 2020-05-07 LAB — CBC WITH DIFFERENTIAL/PLATELET
Abs Immature Granulocytes: 0.02 10*3/uL (ref 0.00–0.07)
Basophils Absolute: 0 10*3/uL (ref 0.0–0.1)
Basophils Relative: 0 %
Eosinophils Absolute: 0.1 10*3/uL (ref 0.0–0.5)
Eosinophils Relative: 2 %
HCT: 26.3 % — ABNORMAL LOW (ref 36.0–46.0)
Hemoglobin: 8.3 g/dL — ABNORMAL LOW (ref 12.0–15.0)
Immature Granulocytes: 0 %
Lymphocytes Relative: 17 %
Lymphs Abs: 1.1 10*3/uL (ref 0.7–4.0)
MCH: 30.9 pg (ref 26.0–34.0)
MCHC: 31.6 g/dL (ref 30.0–36.0)
MCV: 97.8 fL (ref 80.0–100.0)
Monocytes Absolute: 0.3 10*3/uL (ref 0.1–1.0)
Monocytes Relative: 5 %
Neutro Abs: 4.9 10*3/uL (ref 1.7–7.7)
Neutrophils Relative %: 76 %
Platelets: 107 10*3/uL — ABNORMAL LOW (ref 150–400)
RBC: 2.69 MIL/uL — ABNORMAL LOW (ref 3.87–5.11)
RDW: 13.9 % (ref 11.5–15.5)
WBC: 6.4 10*3/uL (ref 4.0–10.5)
nRBC: 0 % (ref 0.0–0.2)

## 2020-05-07 LAB — C DIFFICILE QUICK SCREEN W PCR REFLEX
C Diff antigen: NEGATIVE
C Diff interpretation: NOT DETECTED
C Diff toxin: NEGATIVE

## 2020-05-07 LAB — MAGNESIUM: Magnesium: 1.8 mg/dL (ref 1.7–2.4)

## 2020-05-07 MED ORDER — HYDRALAZINE HCL 10 MG PO TABS: 10.0000 mg | ORAL_TABLET | Freq: Three times a day (TID) | ORAL | Status: DC | PRN

## 2020-05-07 MED ORDER — AMLODIPINE BESYLATE 10 MG PO TABS
10.0000 mg | ORAL_TABLET | Freq: Every day | ORAL | Status: DC
  Administered 2020-05-08 – 2020-05-10 (×3): 10 mg via ORAL
  Filled 2020-05-07 (×3): qty 1

## 2020-05-07 MED ORDER — HYDRALAZINE HCL 10 MG PO TABS: 10.0000 mg | ORAL_TABLET | Freq: Three times a day (TID) | ORAL | Status: DC

## 2020-05-07 MED ORDER — DEXTROSE-NACL 5-0.9 % IV SOLN: INTRAVENOUS | Status: DC

## 2020-05-07 MED ORDER — LIP MEDEX EX OINT
TOPICAL_OINTMENT | CUTANEOUS | Status: AC
  Filled 2020-05-07: qty 7

## 2020-05-07 NOTE — Progress Notes (Signed)
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  Patient reports multiple hospitalizations for recurrent Crohn's disease related abdominal pain and frequent PICC line infections. She is most recently hospitalized from 04/13/2020-04/18/2020 for significant nausea, vomiting, diarrhea, and abdominal pain. She was managed with supportive care and discharged home.  Patient states she has been managing fairly well since then until earlier today (05/05/2020) when she developed a fever up to 102 Fahrenheit. She had associated chills and body aches. She has continued nausea and abdominal pain however feels this is less severe and less frequent than when she was hospitalized last time. She feels that her left femoral PICC line is functioning well and has not seen any skin changes or obvious wounds. She denies any associated cough, chest pain, dyspnea, dysuria. She reports chronic diarrhea which is becoming less frequent and only occurring up to 3 times per day now. She feels that she is not able to maintain adequate oral intake at this time.  6/11: Bld Cx w/ gram variable rods. Change abx to zosyn. Awaiting final culture results. Check c diff, GI PCR. Check RUQ [place] for LFTs.  6/12: LFTs improved. Bld Cx positive for pseudomonas putida. Awaiting sensitivities. Continue zosyn. Needs a line holiday. Place PVL and pull PICC. Needs BP control. Add hydralazine.   Assessment & Plan:   Principal Problem:   Febrile illness Active  Problems:   Hypokalemia   Crohn disease ([place])   Short gut syndrome   HTN (hypertension)   Depression   Acute kidney injury superimposed on chronic kidney disease ([name])   On total parenteral nutrition (TPN)   Abnormal LFTs   Febrile illness, acute  Febrile illness Bacteremia, Pseudomonas Putida     - gram variable bacteremia     - continue zosyn for now     - fever is improved     - 6/12: Pseudomonas Putida growing in Bld Cx. Awaiting sensitivities. Needs PICC holiday. I have spoken with IR. Appreciate assistance. Continue zosyn for now.   Extensive Crohn's disease s/p multiple bowel resections and resultant short bowel syndrome now TPN dependent with chronic abdominal pain:     - Follows with [facility], Dr. Domenica Fail.     - Keep n.p.o. for now except sips with meds     - Continue holding TPN for now     - Continue pain control with as needed oral tramadol and IV Dilaudid 2 mg q3h only for severe pain with hold parameters     - LR at 125cc/hr, follow     - 6/12: Decrease LR to 75cc/hr  AKI on CKD stage III:     - Creatinine 1.88 on admission compared to 1.26 on 04/18/2020.     - SCr down to 1.64; continue LR     - 6/12: Scr stable at 1.63; continuing fluids. Follow.   Abnormal LFTs:     - at admission: AST 637, ALT 604, alk phos 187 with total bilirubin 0.9.       - CT abdomen/pelvis shows stable chronic changes without acute findings.     -  Hepatitis panel is negative     - RUQ [place] pending     - 6/12: LFTs improved. RUQ noted. Follow.   Chronic pancreatic duct dilatation:     - Seen again on CT abdomen/pelvis and appears stable.  Hypokalemia     - resolved, check Mg2+     - 6/12: Mg2+ ok, K+ ok.   Hypertension:     - Continue amlodipine and Lopressor.     - BP is acceptable     - 6/12: increase amlodipine to 34m, add PRN hydralazine  Anemia of chronic disease and iron deficiency:     - no evidence of bleed, monitor  Depression:     - continue Wellbutrin and  Cymbalta.  Chronic pain:     - continue tramadol and IV Dilaudid as needed with hold parameters as above.  DVT prophylaxis: heparin Code Status: FULL   Status is: Inpatient  Remains inpatient appropriate because:IV treatments appropriate due to intensity of illness or inability to take PO   Dispo: The patient is from: Home              Anticipated d/c is to: Home              Anticipated d/c date is: > 3 days              Patient currently is not medically stable to d/c.  Antimicrobials:  . zosyn   ROS:  Reports N. Denies CP, dyspnea. Remainder 10-pt ROS is negative for all not previously mentioned.  Subjective: "I've had all that before."  Objective: Vitals:   05/06/20 1600 05/06/20 2123 05/07/20 0633 05/07/20 1351  BP: (!) 148/76 (!) 150/67 (!) 167/69 (!) 175/77  Pulse: (!) 58 (!) 55 (!) 51 (!) 58  Resp: 16 16 17 18   Temp: 98.3 F (36.8 C) 98.4 F (36.9 C) 98.6 F (37 C) 98.6 F (37 C)  TempSrc: Oral Oral Oral Oral  SpO2: 97% 97% 96% 97%  Weight:      Height:       No intake or output data in the 24 hours ending 05/07/20 1602 Filed Weights   05/05/20 2039  Weight: 67.6 kg    Examination:  General: 59y.o. female resting in bed in NAD Cardiovascular: RRR, +S1, S2, no m/g/r, equal pulses throughout Respiratory: CTABL, no w/r/r, normal WOB GI: BS+, NDNT, no masses noted, no organomegaly noted MSK: No e/c/c Neuro: A&O x 3, no focal deficits Psyc: Appropriate interaction and affect, calm/cooperative  Data Reviewed: I have personally reviewed following labs and imaging studies.  CBC: Recent Labs  Lab 05/05/20 1539 05/06/20 0525 05/07/20 0500  WBC 6.0 10.5 6.4  NEUTROABS 5.4  --  4.9  HGB 8.7* 8.2* 8.3*  HCT 26.6* 25.3* 26.3*  MCV 95.7 97.7 97.8  PLT 123* 102* 1818   Basic Metabolic Panel: Recent Labs  Lab 05/05/20 1539 05/06/20 0525 05/07/20 0500  NA 138 137 138  K 3.2* 4.0 4.4  CL 102 103 106  CO2 28 25 23   GLUCOSE 85 77 76  BUN 50*  45* 29*  CREATININE 1.88* 1.64* 1.63*  CALCIUM 8.5* 8.6* 9.2  MG 2.1  --  1.8   GFR: Estimated Creatinine Clearance: 37.5 mL/min (A) (by C-G formula based on SCr of 1.63 mg/dL (H)). Liver Function Tests: Recent Labs  Lab 05/05/20 1539 05/06/20 0525 05/07/20 0500  AST 637* 442* 171*  ALT 604* 640* 441*  ALKPHOS 187* 186* 163*  BILITOT 0.9  1.1 1.1  PROT 6.7 6.6 6.6  ALBUMIN 3.3* 3.2* 3.1*   Recent Labs  Lab 05/05/20 2119  LIPASE 19   No results for input(s): AMMONIA in the last 168 hours. Coagulation Profile: No results for input(s): INR, PROTIME in the last 168 hours. Cardiac Enzymes: No results for input(s): CKTOTAL, CKMB, CKMBINDEX, TROPONINI in the last 168 hours. BNP (last 3 results) No results for input(s): PROBNP in the last 8760 hours. HbA1C: No results for input(s): HGBA1C in the last 72 hours. CBG: No results for input(s): GLUCAP in the last 168 hours. Lipid Profile: No results for input(s): CHOL, HDL, LDLCALC, TRIG, CHOLHDL, LDLDIRECT in the last 72 hours. Thyroid Function Tests: No results for input(s): TSH, T4TOTAL, FREET4, T3FREE, THYROIDAB in the last 72 hours. Anemia Panel: No results for input(s): VITAMINB12, FOLATE, FERRITIN, TIBC, IRON, RETICCTPCT in the last 72 hours. Sepsis Labs: Recent Labs  Lab 05/05/20 1539  LATICACIDVEN 0.8    Recent Results (from the past 240 hour(s))  SARS Coronavirus 2 by RT PCR (hospital order, performed in [facility] hospital lab) Nasopharyngeal Nasopharyngeal Swab     Status: None   Collection Time: 05/05/20  3:23 PM   Specimen: Nasopharyngeal Swab  Result Value Ref Range Status   SARS Coronavirus 2 NEGATIVE NEGATIVE Final    Comment: (NOTE) SARS-CoV-2 target nucleic acids are NOT DETECTED.  The SARS-CoV-2 RNA is generally detectable in upper and lower respiratory specimens during the acute phase of infection. The lowest concentration of SARS-CoV-2 viral copies this assay can detect is 250 copies / mL. A  negative result does not preclude SARS-CoV-2 infection and should not be used as the sole basis for treatment or other patient management decisions.  A negative result may occur with improper specimen collection / handling, submission of specimen other than nasopharyngeal swab, presence of viral mutation(s) within the areas targeted by this assay, and inadequate number of viral copies (<250 copies / mL). A negative result must be combined with clinical observations, patient history, and epidemiological information.  Fact Sheet for Patients:   StrictlyIdeas.no  Fact Sheet for Healthcare Providers: BankingDealers.co.za  This test is not yet approved or  cleared by the [place] FDA and has been authorized for detection and/or diagnosis of SARS-CoV-2 by FDA under an Emergency Use Authorization (EUA).  This EUA will remain in effect (meaning this test can be used) for the duration of the COVID-19 declaration under Section 564(b)(1) of the Act, 21 U.S.C. section 360bbb-3(b)(1), unless the authorization is terminated or revoked sooner.  Performed at [facility], [place] [address] [place], [place] [address]   Blood culture (routine x 2)     Status: Abnormal (Preliminary result)   Collection Time: 05/05/20  3:39 PM   Specimen: BLOOD  Result Value Ref Range Status   Specimen Description   Final    BLOOD PICC LINE Performed at [facility], [place] [address], [place], [place] [address]    Special Requests   Final    BOTTLES DRAWN AEROBIC AND ANAEROBIC Blood Culture adequate volume Performed at [place] [address], [place], [place] [address]    Culture  Setup Time (A)  Final    GRAM VARIABLE ROD IN BOTH AEROBIC AND ANAEROBIC BOTTLES CRITICAL RESULT CALLED TO, READ BACK BY AND VERIFIED WITH: PHARMD M HICKS [id number] AT 920 AM BY CM    Culture (A)  Final    PSEUDOMONAS  PUTIDA SUSCEPTIBILITIES TO FOLLOW Performed at [facility]  [facility], [place] [address], [place], [place] [address]    Report Status PENDING  Incomplete  Blood Culture ID Panel (Reflexed)     Status: None   Collection Time: 05/05/20  3:39 PM  Result Value Ref Range Status   Enterococcus species NOT DETECTED NOT DETECTED Final   Listeria monocytogenes NOT DETECTED NOT DETECTED Final   Staphylococcus species NOT DETECTED NOT DETECTED Final   Staphylococcus aureus (BCID) NOT DETECTED NOT DETECTED Final   Streptococcus species NOT DETECTED NOT DETECTED Final   Streptococcus agalactiae NOT DETECTED NOT DETECTED Final   Streptococcus pneumoniae NOT DETECTED NOT DETECTED Final   Streptococcus pyogenes NOT DETECTED NOT DETECTED Final   Acinetobacter baumannii NOT DETECTED NOT DETECTED Final   Enterobacteriaceae species NOT DETECTED NOT DETECTED Final   Enterobacter cloacae complex NOT DETECTED NOT DETECTED Final   Escherichia coli NOT DETECTED NOT DETECTED Final   Klebsiella oxytoca NOT DETECTED NOT DETECTED Final   Klebsiella pneumoniae NOT DETECTED NOT DETECTED Final   Proteus species NOT DETECTED NOT DETECTED Final   Serratia marcescens NOT DETECTED NOT DETECTED Final   Haemophilus influenzae NOT DETECTED NOT DETECTED Final   Neisseria meningitidis NOT DETECTED NOT DETECTED Final   Pseudomonas aeruginosa NOT DETECTED NOT DETECTED Final   Candida albicans NOT DETECTED NOT DETECTED Final   Candida glabrata NOT DETECTED NOT DETECTED Final   Candida krusei NOT DETECTED NOT DETECTED Final   Candida parapsilosis NOT DETECTED NOT DETECTED Final   Candida tropicalis NOT DETECTED NOT DETECTED Final    Comment: Performed at [facility], [place] [address], [place], [place] [address]  Blood culture (routine x 2)     Status: Abnormal (Preliminary result)   Collection Time: 05/05/20  3:55 PM   Specimen: BLOOD  Result Value Ref Range Status   Specimen Description   Final    BLOOD PICC LINE Performed  at [facility], [place] [address] [place], [place] [address]    Special Requests   Final    BOTTLES DRAWN AEROBIC AND ANAEROBIC Blood Culture adequate volume Performed at [place] [facility] [address], [place], [place] [address]    Culture  Setup Time (A)  Final    GRAM VARIABLE ROD AEROBIC BOTTLE ONLY CRITICAL VALUE NOTED.  VALUE IS CONSISTENT WITH PREVIOUSLY REPORTED AND CALLED VALUE. Performed at [facility], [place] [address], [place],  [address]    Culture PSEUDOMONAS PUTIDA (A)  Final   Report Status PENDING  Incomplete      Radiology Studies: CT Abdomen Pelvis Wo Contrast  Result Date: 05/05/2020 CLINICAL DATA:  Severe abdominal pain.  Fever.  Crohn disease. EXAM: CT ABDOMEN AND PELVIS WITHOUT CONTRAST TECHNIQUE: Multidetector CT imaging of the abdomen and pelvis was performed following the standard protocol without IV contrast. COMPARISON:  04/13/2020 FINDINGS: Lower chest: No acute findings. Hepatobiliary: No mass visualized on this unenhanced exam. Prior cholecystectomy. Pneumobilia again seen, without evidence of biliary obstruction. Pancreas: No mass or inflammatory process visualized on this unenhanced exam. Diffuse pancreatic ductal dilatation is demonstrated. Spleen:  Within normal limits in size. Adrenals/Urinary tract: No evidence of urolithiasis or hydronephrosis. Unremarkable unopacified urinary bladder. Stomach/Bowel: Stable postop changes from previous bowel resection. No evidence of obstruction, inflammatory process, or abnormal fluid collections. Vascular/Lymphatic: No pathologically enlarged lymph nodes identified. No evidence of abdominal aortic aneurysm. Left femoral vein catheter remains in place with tip in the IVC. Reproductive: Prior hysterectomy noted. Adnexal regions are unremarkable in appearance.  Other:  None. Musculoskeletal:  No suspicious bone lesions identified. IMPRESSION: Stable exam. No evidence of active  Crohn disease or other acute findings. Electronically Signed   By: Marlaine Hind M.D.   On: 05/05/2020 17:49   DG Chest 2 View  Result Date: 05/05/2020 CLINICAL DATA:  Fever. EXAM: CHEST - 2 VIEW COMPARISON:  March 05, 2020 FINDINGS: There is no evidence of acute infiltrate, pleural effusion or pneumothorax. Surgical clips are seen along the right perihilar region and right lung base. The heart size and mediastinal contours are within normal limits. The visualized skeletal structures are unremarkable. IMPRESSION: No active cardiopulmonary disease. Electronically Signed   By: Virgina Norfolk M.D.   On: 05/05/2020 21:43   US Abdomen Limited RUQ  Result Date: 05/06/2020 CLINICAL DATA:  Elevated LFTs. EXAM: ULTRASOUND ABDOMEN LIMITED RIGHT UPPER QUADRANT COMPARISON:  CT yesterday. FINDINGS: Gallbladder: Surgically absent. Common bile duct: Diameter: 5 mm. Liver: Pneumobilia on prior CT is not well demonstrated by ultrasound. Within normal limits in parenchymal echogenicity. Portal vein is patent on color Doppler imaging with normal direction of blood flow towards the liver. Other: Trace free fluid adjacent to the liver, no additional ascites. Increased echogenicity of the right kidney suggestive of medical renal disease. IMPRESSION: 1. No explanation for LFTs.  Normal hepatic echogenicity. 2. Pneumobilia on recent CT not well demonstrated sonographically. 3. Post cholecystectomy without biliary dilatation. Electronically Signed   By: Keith Rake M.D.   On: 05/06/2020 18:30     Scheduled Meds: . amLODipine  5 mg Oral Daily  . budesonide  6 mg Oral Daily  . buPROPion  200 mg Oral Daily  . Chlorhexidine Gluconate Cloth  6 each Topical Q0600  . cycloSPORINE  1 drop Both Eyes BID  . dicyclomine  20 mg Oral TID  . DULoxetine  90 mg Oral Daily  . heparin  5,000 Units Subcutaneous Q8H  . metoprolol tartrate  100 mg Oral BID  . pantoprazole  40 mg Oral Daily  . polyvinyl alcohol  1 drop Both Eyes QID   . sucralfate  1 g Oral QID   Continuous Infusions: . lactated ringers with kcl 100 mL/hr at 05/07/20 1133  . piperacillin-tazobactam (ZOSYN)  IV 3.375 g (05/07/20 1135)     LOS: 1 day    Time spent: 25 minutes spent in the coordination of care today.    Jonnie Finner, DO [facility]  If 7PM-7AM, please contact night-coverage [email or web address] 05/07/2020, 4:02 PM

## 2020-05-07 NOTE — Progress Notes (Signed)
Patient's PICC line currently being used for antibiotic administration. Per bedside RN request IR will remove the PICC line after antibiotics administration is complete. Should completion not occur until after hours PICC line removal will be scheduled for 6.13.21

## 2020-05-08 LAB — CBC WITH DIFFERENTIAL/PLATELET
Abs Immature Granulocytes: 0.01 10*3/uL (ref 0.00–0.07)
Basophils Absolute: 0 10*3/uL (ref 0.0–0.1)
Basophils Relative: 0 %
Eosinophils Absolute: 0.1 10*3/uL (ref 0.0–0.5)
Eosinophils Relative: 2 %
HCT: 28.3 % — ABNORMAL LOW (ref 36.0–46.0)
Hemoglobin: 9.1 g/dL — ABNORMAL LOW (ref 12.0–15.0)
Immature Granulocytes: 0 %
Lymphocytes Relative: 25 %
Lymphs Abs: 1 10*3/uL (ref 0.7–4.0)
MCH: 31 pg (ref 26.0–34.0)
MCHC: 32.2 g/dL (ref 30.0–36.0)
MCV: 96.3 fL (ref 80.0–100.0)
Monocytes Absolute: 0.4 10*3/uL (ref 0.1–1.0)
Monocytes Relative: 9 %
Neutro Abs: 2.5 10*3/uL (ref 1.7–7.7)
Neutrophils Relative %: 64 %
Platelets: 121 10*3/uL — ABNORMAL LOW (ref 150–400)
RBC: 2.94 MIL/uL — ABNORMAL LOW (ref 3.87–5.11)
RDW: 13.3 % (ref 11.5–15.5)
WBC: 4 10*3/uL (ref 4.0–10.5)
nRBC: 0 % (ref 0.0–0.2)

## 2020-05-08 LAB — GASTROINTESTINAL PANEL BY PCR, STOOL (REPLACES STOOL CULTURE)

## 2020-05-08 LAB — COMPREHENSIVE METABOLIC PANEL
ALT: 333 U/L — ABNORMAL HIGH (ref 0–44)
AST: 88 U/L — ABNORMAL HIGH (ref 15–41)
Albumin: 3.3 g/dL — ABNORMAL LOW (ref 3.5–5.0)
Alkaline Phosphatase: 186 U/L — ABNORMAL HIGH (ref 38–126)
Anion gap: 10 (ref 5–15)
BUN: 18 mg/dL (ref 6–20)
CO2: 26 mmol/L (ref 22–32)
Calcium: 9 mg/dL (ref 8.9–10.3)
Chloride: 104 mmol/L (ref 98–111)
Creatinine, Ser: 1.61 mg/dL — ABNORMAL HIGH (ref 0.44–1.00)
GFR calc Af Amer: 40 mL/min — ABNORMAL LOW (ref >60)
GFR calc non Af Amer: 35 mL/min — ABNORMAL LOW (ref >60)
Glucose, Bld: 99 mg/dL (ref 70–99)
Potassium: 3.6 mmol/L (ref 3.5–5.1)
Sodium: 140 mmol/L (ref 135–145)
Total Bilirubin: 1.3 mg/dL — ABNORMAL HIGH (ref 0.3–1.2)
Total Protein: 7.4 g/dL (ref 6.5–8.1)

## 2020-05-08 LAB — CULTURE, BLOOD (ROUTINE X 2)
Special Requests: ADEQUATE
Special Requests: ADEQUATE

## 2020-05-08 LAB — MAGNESIUM: Magnesium: 1.7 mg/dL (ref 1.7–2.4)

## 2020-05-08 MED ORDER — HYDRALAZINE HCL 25 MG PO TABS
25.0000 mg | ORAL_TABLET | Freq: Three times a day (TID) | ORAL | Status: DC
  Administered 2020-05-08 – 2020-05-09 (×4): 25 mg via ORAL
  Filled 2020-05-08 (×4): qty 1

## 2020-05-08 NOTE — Consult Note (Addendum)
Chief Complaint: Concern for left femoral PICC line infection - PICC exchange due to poor venous access  Referring Physician(s): Dr. Sylvan Cheese Supervising Physician: Daryll Brod  Patient Status: [facility] - In-pt  History of Present Illness: Caitlin Peters is a 59 y.o. 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 TPN depenendent. Presented to the emergency department with a fevers. Cultures from PICC line shows pseudomonas putida.  Team initially requesting a line holiday however after direct discussion about the patient's limited venous access the request has been changed to PICC exchange. This was discussed with the patient who is in agreement with the plan of care  Past Medical History:  Diagnosis Date  . Acute pancreatitis 04/13/2020  . Anasarca 10/2019  . AVN (avascular necrosis of bone) ([place])   . Cataract   . Chronic pain syndrome   . CKD (chronic kidney disease), stage III   . Crohn disease ([place])   . Crohn disease ([place])   . Depression   . Diverticulosis   . GERD (gastroesophageal reflux disease)   . HTN (hypertension)   . IDA (iron deficiency anemia)   . Malnutrition ([place])   . Mass in chest   . Osteoporosis   . Pancreatitis   . Short gut syndrome   . Vitamin B12 deficiency     Past Surgical History:  Procedure Laterality Date  . ABDOMINAL HYSTERECTOMY    . ABDOMINAL SURGERY     colon resection with abdominal stoma, repair of intestinal leak, reversal of abdominal stoma  . BILIARY DILATION  11/26/2019   Procedure: BILIARY DILATION;  Surgeon: Jackquline Denmark, MD;  Location: [facility];  Service: Endoscopy;;  . BILIARY DILATION  03/08/2020   Procedure: BILIARY DILATION;  Surgeon: Irving Copas., MD;  Location: [place];  Service: Gastroenterology;;  . BIOPSY  03/08/2020   Procedure: BIOPSY;  Surgeon: Irving Copas., MD;  Location: [place];  Service: Gastroenterology;;  .  CHEST WALL RESECTION     right thoracotomy,resection of chest mass with anterior rib and reconstruction using prosthetic mesh and video arthroscopy  . CHOLECYSTECTOMY    . COLONOSCOPY  2019  . ERCP N/A 11/26/2019   Procedure: ENDOSCOPIC RETROGRADE CHOLANGIOPANCREATOGRAPHY (ERCP);  Surgeon: Jackquline Denmark, MD;  Location: [facility];  Service: Endoscopy;  Laterality: N/A;  . ERCP N/A 03/08/2020   Procedure: ENDOSCOPIC RETROGRADE CHOLANGIOPANCREATOGRAPHY (ERCP);  Surgeon: Irving Copas., MD;  Location: [place];  Service: Gastroenterology;  Laterality: N/A;  . ESOPHAGOGASTRODUODENOSCOPY N/A 03/08/2020   Procedure: ESOPHAGOGASTRODUODENOSCOPY (EGD);  Surgeon: Irving Copas., MD;  Location: [place];  Service: Gastroenterology;  Laterality: N/A;  . EUS N/A 03/08/2020   Procedure: UPPER ENDOSCOPIC ULTRASOUND (EUS) LINEAR;  Surgeon: Irving Copas., MD;  Location: [place];  Service: Gastroenterology;  Laterality: N/A;  . IR FLUORO GUIDE CV LINE LEFT  01/07/2020  . IR FLUORO GUIDE CV LINE LEFT  03/09/2020  . IR PTA VENOUS EXCEPT DIALYSIS CIRCUIT  01/07/2020  . IR US GUIDE VASC ACCESS LEFT     x 2 06/17/19 and 09/14/2019  . KNEE SURGERY     right knee   . REMOVAL OF STONES  11/26/2019   Procedure: REMOVAL OF STONES;  Surgeon: Jackquline Denmark, MD;  Location: [facility];  Service: Endoscopy;;  . REMOVAL OF STONES  03/08/2020   Procedure: REMOVAL OF STONES;  Surgeon: Irving Copas., MD;  Location: [place];  Service: Gastroenterology;;  . SMALL INTESTINE SURGERY  x3  . SPHINCTEROTOMY  11/26/2019   Procedure: SPHINCTEROTOMY;  Surgeon: Jackquline Denmark, MD;  Location: [facility];  Service: Endoscopy;;  . UPPER GASTROINTESTINAL ENDOSCOPY      Allergies: Cefepime, Gabapentin, Hyoscyamine, Lyrica [pregabalin], Meperidine, Topamax [topiramate], Fentanyl, and Morphine and related  Medications: Prior to Admission medications   Medication Sig Start Date End  Date Taking? Authorizing Provider  acetaminophen (TYLENOL) 325 MG tablet Take 650 mg by mouth every 6 (six) hours as needed for mild pain.    Yes [provider]  amLODipine (NORVASC) 5 MG tablet Take 1 tablet (5 mg total) by mouth daily. 10/16/19  Yes [name], [name], MD  budesonide (ENTOCORT EC) 3 MG 24 hr capsule TAKE 3 CAPSULES BY MOUTH ONCE DAILY Patient taking differently: Take 6 mg by mouth daily.  04/05/20  Yes [name], [name], MD  buPROPion [facility] SR) 100 MG 12 hr tablet Take 200 mg by mouth daily.    Yes [provider]  cholecalciferol (VITAMIN D3) 25 MCG (1000 UT) tablet Take 1,000 Units by mouth daily.    Yes [provider]  cycloSPORINE (RESTASIS) 0.05 % ophthalmic emulsion Place 1 drop into both eyes 2 (two) times daily.   Yes [provider]  dexlansoprazole (DEXILANT) 60 MG capsule Take 1 capsule (60 mg total) by mouth daily. 10/16/19  Yes [name], [name], MD  Dextran 70-Hypromellose 0.1-0.3 % SOLN Place 1 drop into both eyes 4 (four) times daily.   Yes [provider]  dicyclomine (BENTYL) 20 MG tablet Take 20 mg by mouth 3 (three) times daily. 10/26/19  Yes [provider]  diphenoxylate-atropine (LOMOTIL) 2.5-0.025 MG tablet Take 1 tablet by mouth 4 (four) times daily as needed for diarrhea or loose stools. 10/31/19  Yes Dessa Phi, DO  DULoxetine (CYMBALTA) 30 MG capsule Take 3 capsules (90 mg total) by mouth daily. Patient taking differently: Take 90 mg by mouth daily. Takes along with 60 mg tablet (Total 90 mg) 11/01/19  Yes Dessa Phi, DO  DULoxetine (CYMBALTA) 60 MG capsule Take 60 mg by mouth daily. Takes along with 30 mg tablet (Total 90 mg) 12/09/19  Yes [provider]  estradiol (ESTRACE) 2 MG tablet Take 1 tablet (2 mg total) by mouth daily. 12/10/19  Yes [name], [name], MD  famotidine (PEPCID) 20 MG tablet Take 20 mg by mouth at bedtime.   Yes [provider]  HYDROmorphone (DILAUDID) 4 MG tablet Take 1 tablet (4 mg total) by mouth every 6 (six) hours as needed for severe pain. 12/06/19  Yes Ripley Fraise, MD  lipase/protease/amylase (CREON) 36000 UNITS CPEP capsule Take 36,000 Units by mouth 3 (three) times daily as needed (with meals for digestion).    Yes [provider]  loperamide (IMODIUM) 2 MG capsule TAKE 1 CAPSULE BY MOUTH   AS NEEDED FOR DIARRHEA OR  LOOSE  STOOLS Patient taking differently: Take 2 mg by mouth in the morning, at noon, in the evening, and at bedtime.  03/22/20  Yes [name], [name], MD  methadone (DOLOPHINE) 5 MG tablet Take 5 mg by mouth 5 (five) times daily.  03/29/20  Yes [provider]  metoprolol tartrate (LOPRESSOR) 100 MG tablet Take 1 tablet (100 mg total) by mouth 2 (two) times daily. 03/03/20  Yes [name], [name], MD  Multiple Vitamins-Minerals (MULTIVITAMIN ADULT PO) Take 1 tablet by mouth daily.   Yes [provider]  Omega-3 1000 MG CAPS Take 1,000  mg by mouth daily.    Yes [provider]  potassium chloride SA (KLOR-CON) 20 MEQ tablet Take 40 mEq by mouth daily.   Yes [provider]  PRESCRIPTION MEDICATION Inject 1 each into the vein daily. Home TPN . [employer] in [facility] . 1 bag for 16 hours.   Yes [provider]  Probiotic Product (PROBIOTIC-10 PO) Take 2 capsules by mouth daily.    Yes [provider]  promethazine (PHENERGAN) 25 MG tablet TAKE 1 TABLET BY MOUTH EVERY 6 HOURS AS NEEDED FOR NAUSEA AND VOMITING Patient taking differently: Take 25 mg by mouth every 6 (six) hours.  04/12/20  Yes [name], [name], MD  traZODone (DESYREL) 100 MG tablet Take 2 tablets (200 mg total) by mouth at bedtime as needed for sleep. 12/31/19  Yes [name], [name], MD  vitamin B-12 (CYANOCOBALAMIN) 100 MCG tablet Take 1,000 mcg by mouth daily.    Yes [provider]  methadone (DOLOPHINE) 10 MG  tablet Take 1 tablet (10 mg total) by mouth every 8 (eight) hours. Patient not taking: Reported on 05/05/2020 01/11/20   Modena Jansky, MD  sucralfate (CARAFATE) 1 GM/10ML suspension Take 10 mLs (1 g total) by mouth 4 (four) times daily -  with meals and at bedtime. Patient taking differently: Take 1 g by mouth 4 (four) times daily.  12/15/19   Erline Hau, MD     Family History  Problem Relation Age of Onset  . Breast cancer Sister   . Multiple sclerosis Sister   . Diabetes Sister   . Lupus Sister   . Colon cancer Other   . Crohn's disease Other   . Seizures Mother   . Glaucoma Mother   . CAD Father   . Heart disease Father   . Hypertension Father     Social History   Socioeconomic History  . Marital status: Single    Spouse name: Not on file  . Number of children: Not on file  . Years of education: Not on file  . Highest education level: Not on file  Occupational History  . Occupation: disabled  Tobacco Use  . Smoking status: Former Research scientist (life sciences)  . Smokeless tobacco: Never Used  Vaping Use  . Vaping Use: Never used  Substance and Sexual Activity  . Alcohol use: Not Currently  . Drug use: Never  . Sexual activity: Not on file  Other Topics Concern  . Not on file  Social History Narrative  . Not on file   Social Determinants of Health   Financial Resource Strain:   . Difficulty of Paying Living Expenses:   Food Insecurity:   . Worried About Charity fundraiser in the Last Year:   . Arboriculturist in the Last Year:   Transportation Needs:   . Film/video editor (Medical):   [name] Kitchen Lack of Transportation (Non-Medical):   Physical Activity:   . Days of Exercise per Week:   . Minutes of Exercise per Session:   Stress:   . Feeling of Stress :   Social Connections:   . Frequency of Communication with Friends and Family:   . Frequency of Social Gatherings with Friends and Family:   . Attends Religious Services:   . Active Member of Clubs or  Organizations:   . Attends Archivist Meetings:   [name] Kitchen Marital Status:     Review of Systems: A 12 point ROS discussed and pertinent positives  are indicated in the HPI above.  All other systems are negative.  Review of Systems  Constitutional: Positive for fatigue. Negative for fever.  HENT: Negative for congestion.   Respiratory: Negative for cough and shortness of breath.   Gastrointestinal: Positive for nausea. Negative for abdominal pain, diarrhea and vomiting.    Vital Signs: BP (!) 179/76 (BP Location: Right Arm)   Pulse (!) 54   Temp 98.5 F (36.9 C) (Oral)   Resp 18   Ht 5' 8"  (1.727 m)   Wt 149 lb (67.6 kg)   SpO2 100%   BMI 22.66 kg/m   Physical Exam Vitals and nursing note reviewed.  Constitutional:      Appearance: She is well-developed.  HENT:     Head: Normocephalic and atraumatic.  Eyes:     Conjunctiva/sclera: Conjunctivae normal.  Cardiovascular:     Rate and Rhythm: Normal rate and regular rhythm.     Pulses: Normal pulses.     Heart sounds: Normal heart sounds.  Pulmonary:     Effort: Pulmonary effort is normal.  Musculoskeletal:        General: Normal range of motion.     Cervical back: Normal range of motion.  Skin:    General: Skin is warm.  Neurological:     Mental Status: She is alert and oriented to person, place, and time.     Imaging: CT Abdomen Pelvis Wo Contrast  Result Date: 05/05/2020 CLINICAL DATA:  Severe abdominal pain.  Fever.  Crohn disease. EXAM: CT ABDOMEN AND PELVIS WITHOUT CONTRAST TECHNIQUE: Multidetector CT imaging of the abdomen and pelvis was performed following the standard protocol without IV contrast. COMPARISON:  04/13/2020 FINDINGS: Lower chest: No acute findings. Hepatobiliary: No mass visualized on this unenhanced exam. Prior cholecystectomy. Pneumobilia again seen, without evidence of biliary obstruction. Pancreas: No mass or inflammatory process visualized on this unenhanced exam. Diffuse pancreatic  ductal dilatation is demonstrated. Spleen:  Within normal limits in size. Adrenals/Urinary tract: No evidence of urolithiasis or hydronephrosis. Unremarkable unopacified urinary bladder. Stomach/Bowel: Stable postop changes from previous bowel resection. No evidence of obstruction, inflammatory process, or abnormal fluid collections. Vascular/Lymphatic: No pathologically enlarged lymph nodes identified. No evidence of abdominal aortic aneurysm. Left femoral vein catheter remains in place with tip in the IVC. Reproductive: Prior hysterectomy noted. Adnexal regions are unremarkable in appearance. Other:  None. Musculoskeletal:  No suspicious bone lesions identified. IMPRESSION: Stable exam. No evidence of active Crohn disease or other acute findings. Electronically Signed   By: Marlaine Hind M.D.   On: 05/05/2020 17:49   CT ABDOMEN PELVIS WO CONTRAST  Result Date: 04/13/2020 CLINICAL DATA:  Nausea and vomiting. Patient has history of cholecystectomy, appendectomy, and previous colon surgery for Crohn's disease. EXAM: CT ABDOMEN AND PELVIS WITHOUT CONTRAST TECHNIQUE: Multidetector CT imaging of the abdomen and pelvis was performed following the standard protocol without IV contrast. COMPARISON:  CT 03/05/2020, MRCP 03/07/2020 FINDINGS: Lower chest: The lung bases are clear. Hepatobiliary: Minimal pneumobilia in the left lobe likely related to prior ERCP. No focal hepatic lesion. Prior cholecystectomy. No biliary dilatation on noncontrast exam. Pancreas: Proximal pancreatic ductal dilatation of 5 mm which appears similar to prior exam. There is suggestion of retained contrast in the pancreatic duct from prior ERCP. No peripancreatic fat stranding. No peripancreatic fluid collection. Spleen: Normal in size without focal abnormality. Adrenals/Urinary Tract: No adrenal nodule. No hydronephrosis or perinephric edema. No renal stone. Urinary bladder is decompressed. Stomach/Bowel: Stomach is unremarkable. Duodenum  approaches the midline  with small-bowel than courses in the right abdomen, unchanged. There is no evidence of bowel obstruction or inflammation. Prior right hemicolectomy. Additional chain sutures within small bowel. Enteric contrast reaches the ileocolic junction. Mild gaseous distension of transverse, descending, and to a lesser extent sigmoid colon is also seen on prior exam. Scattered liquid stool in the colon. No colonic wall thickening or inflammation. Vascular/Lymphatic: Left common femoral venous catheter tip is in the IVC. Mild aortic atherosclerosis. No bulky adenopathy, evaluation for enlarged lymph nodes is limited in the absence of IV contrast and paucity of intra-abdominal fat. Reproductive: Status post hysterectomy. No adnexal masses. Other: No ascites, free air, or significant inflammatory change. No body wall hernia. Musculoskeletal: There are no acute or suspicious osseous abnormalities. Chronic avascular necrosis of the left femoral head. Chronic bone island in the left pelvis. IMPRESSION: 1. No acute findings in the abdomen/pelvis. 2. Unchanged pancreatic ductal dilatation without evidence of pancreatitis/pancreatic inflammation. 3. No acute bowel inflammation. Mild chronic colonic distension, unchanged from prior exam. No acute inflammation. Aortic Atherosclerosis (ICD10-I70.0). Electronically Signed   By: Keith Rake M.D.   On: 04/13/2020 21:37   DG Chest 2 View  Result Date: 05/05/2020 CLINICAL DATA:  Fever. EXAM: CHEST - 2 VIEW COMPARISON:  March 05, 2020 FINDINGS: There is no evidence of acute infiltrate, pleural effusion or pneumothorax. Surgical clips are seen along the right perihilar region and right lung base. The heart size and mediastinal contours are within normal limits. The visualized skeletal structures are unremarkable. IMPRESSION: No active cardiopulmonary disease. Electronically Signed   By: Virgina Norfolk M.D.   On: 05/05/2020 21:43   US Abdomen Limited  RUQ  Result Date: 05/06/2020 CLINICAL DATA:  Elevated LFTs. EXAM: ULTRASOUND ABDOMEN LIMITED RIGHT UPPER QUADRANT COMPARISON:  CT yesterday. FINDINGS: Gallbladder: Surgically absent. Common bile duct: Diameter: 5 mm. Liver: Pneumobilia on prior CT is not well demonstrated by ultrasound. Within normal limits in parenchymal echogenicity. Portal vein is patent on color Doppler imaging with normal direction of blood flow towards the liver. Other: Trace free fluid adjacent to the liver, no additional ascites. Increased echogenicity of the right kidney suggestive of medical renal disease. IMPRESSION: 1. No explanation for LFTs.  Normal hepatic echogenicity. 2. Pneumobilia on recent CT not well demonstrated sonographically. 3. Post cholecystectomy without biliary dilatation. Electronically Signed   By: Keith Rake M.D.   On: 05/06/2020 18:30    Labs:  CBC: Recent Labs    05/05/20 1539 05/06/20 0525 05/07/20 0500 05/08/20 0500  WBC 6.0 10.5 6.4 4.0  HGB 8.7* 8.2* 8.3* 9.1*  HCT 26.6* 25.3* 26.3* 28.3*  PLT 123* 102* 107* 121*    COAGS: Recent Labs    01/02/20 1139 03/05/20 0048  INR 1.1 1.0  APTT  --  24    BMP: Recent Labs    05/05/20 1539 05/06/20 0525 05/07/20 0500 05/08/20 0500  NA 138 137 138 140  K 3.2* 4.0 4.4 3.6  CL 102 103 106 104  CO2 28 25 23 26   GLUCOSE 85 77 76 99  BUN 50* 45* 29* 18  CALCIUM 8.5* 8.6* 9.2 9.0  CREATININE 1.88* 1.64* 1.63* 1.61*  GFRNONAA 29* 34* 34* 35*  GFRAA 33* 39* 40* 40*    LIVER FUNCTION TESTS: Recent Labs    05/05/20 1539 05/06/20 0525 05/07/20 0500 05/08/20 0500  BILITOT 0.9 1.1 1.1 1.3*  AST 637* 442* 171* 88*  ALT 604* 640* 441* 333*  ALKPHOS 187* 186* 163* 186*  PROT 6.7 6.6  6.6 7.4  ALBUMIN 3.3* 3.2* 3.1* 3.3*    Assessment and Plan:  59 y.o, female inpatient. History of anemia, extensive Crohn's disease with multiple bowel resections resulting in short bowel syndrome. TPN dependent. Presented to the emergency  department with a fevers. Cultures from PICC line shows pseudomonas putida.  Team initially requesting a line holiday however after direct discussion about the patient's limited venous access the request has been changed to PICC exchange. This was discussed with the patient who is in agreement with the plan of care  Pertinent Imaging 6.10.21 - Chest xray  Pertinent IR History 4.14.21 - Left femoral line exchange due to concern for infection 2.11.21 - Left femoral line exchange with angioplasty  due to concern for infection.  Pertinent Allergies Fentanyl Morphine Cefepime  Cultures from PICC positive for psuedomonas putida  All labs and medications are within acceptable parameters.  Patient is afebrile.  Risks and benefits of image guided PICC placement was discussed with the patient including, but not limited to bleeding, infection, pneumothorax, or fibrin sheath development and need for additional procedures.  All of the patient's questions were answered, patient is agreeable to proceed. Consent signed and in chart.  IR consulted for possible PICC line exchange. Initial request was for PICC line removal and line holiday however after discussion with the Team and Patient about her limited venous access the request was changed Case has been reviewed and procedure approved by Dr. Annamaria Boots.  Patient tentatively scheduled for 6.14.21.  Team instructed to: Keep Patient to be NPO after midnight Hold prophylactic anticoagulatio the day of the scheduled procedure.   IR will call patient when ready.    Thank you for this interesting consult.  I greatly enjoyed meeting Caitlin Peters and look forward to participating in their care.  A copy of this report was sent to the requesting provider on this date.  Electronically Signed: Avel Peace, NP 05/08/2020, 9:11 AM   I spent a total of 40 Minutes    in face to face in clinical consultation, greater than 50% of which was  counseling/coordinating care for PICC line exchange.

## 2020-05-08 NOTE — Progress Notes (Signed)
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  Patient reports multiple hospitalizations for recurrent Crohn's disease related abdominal pain and frequent PICC line infections. She is most recently hospitalized from 04/13/2020-04/18/2020 for significant nausea, vomiting, diarrhea, and abdominal pain. She was managed with supportive care and discharged home.  Patient states she has been managing fairly well since then until earlier today (05/05/2020) when she developed a fever up to 102 Fahrenheit. She had associated chills and body aches. She has continued nausea and abdominal pain however feels this is less severe and less frequent than when she was hospitalized last time. She feels that her left femoral PICC line is functioning well and has not seen any skin changes or obvious wounds. She denies any associated cough, chest pain, dyspnea, dysuria. She reports chronic diarrhea which is becoming less frequent and only occurring up to 3 times per day now. She feels that she is not able to maintain adequate oral intake at this time.  6/11: Bld Cx w/ gram variable rods. Change abx to zosyn. Awaiting final culture results. Check c diff, GI PCR. Check RUQ [place] for LFTs.  6/12: LFTs improved. Bld Cx positive for pseudomonas putida. Awaiting sensitivities. Continue zosyn. Needs a line holiday. Place PVL and pull PICC. Needs BP control. Add hydralazine.  6/13: She has had great difficulty with PICC access in the past. Spoke with  IR. Will attempt PICC exchange instead for concern of loss of access altogether. Rpt Bld Cx. Start CLD and advance as tolerated. Spoke with ID. Will continue zosyn for an additional 3 weeks. They will set up follow up in their clinic.   Assessment & Plan:   Principal Problem:   Febrile illness Active Problems:   Hypokalemia   Crohn disease ([place])   Short gut syndrome   HTN (hypertension)   Depression   Acute kidney injury superimposed on chronic kidney disease ([name])   On total parenteral nutrition (TPN)   Abnormal LFTs   Febrile illness, acute  Febrile illness Bacteremia, Pseudomonas Putida - gram variable bacteremia - continue zosyn for now - fever is improved - 6/12: Pseudomonas Putida growing in Bld Cx. Awaiting sensitivities. Needs PICC holiday. I have spoken with IR. Appreciate assistance. Continue zosyn for now.      - 6/13: to have PICC exchange tomorrow (essentially down to her last usable spot and their is significant concern that she would not regain access if the PICC was pulled). D/t this, she will need 3 weeks zosyn. ID will follow up in clinic.   Extensive Crohn's disease s/p multiple bowel resections and resultant short bowel syndrome now TPN dependent with chronic abdominal pain: - Follows with [facility], Dr. Domenica Fail. - Keep n.p.o. for now except sips with meds - Continue holding TPN for now - Continue pain control with as needed oral tramadol and IV Dilaudid 2 mg q3h only for severe pain with hold parameters - LR at 125cc/hr, follow     - 6/13: CLD, advance as tolerated. D5NS running at 50cc/hr.  AKI on CKD stage III: - Creatinine 1.88 on admission  compared to 1.26 on 04/18/2020. - 6/13: Scr stable at 1.61; follow.   Abnormal LFTs: - at admission: AST 637, ALT 604, alk phos 187 with total bilirubin 0.9.  - CT abdomen/pelvis shows stable chronic changes without acute findings. - Hepatitis panel is  negative - RUQ [place] pending     - 6/12: LFTs improved. RUQ noted. Follow.   Chronic pancreatic duct dilatation: - Seen again on CT abdomen/pelvis and appears stable.  Hypokalemia - resolved, check Mg2+     - 6/12: Mg2+ ok, K+ ok.   Hypertension: - Continue amlodipine and Lopressor. - BP is acceptable     - 6/13: schedule hydralazine 50m TID  Anemia of chronic disease and iron deficiency: - no evidence of bleed, monitor  Depression: - continue Wellbutrin and Cymbalta.  Chronic pain: - continue tramadol and IV Dilaudid as needed with hold parameters as above.  DVT prophylaxis: heparin Code Status: FULL Family Communication: Patient has decline for me to update family.   Status is: Inpatient  Remains inpatient appropriate because:IV treatments appropriate due to intensity of illness or inability to take PO   Dispo: The patient is from: Home              Anticipated d/c is to: Home              Anticipated d/c date is: 3 days              Patient currently is not medically stable to d/c.  Antimicrobials:  . Zosyn   ROS:  Denies CP, N, V, ab pain . Remainder 10-pt ROS is negative for all not previously mentioned.  Subjective: "I like to let them live their lives."  Objective: Vitals:   05/07/20 1351 05/07/20 2140 05/07/20 2259 05/08/20 0556  BP: (!) 175/77 (!) 189/93 (!) 186/77 (!) 179/76  Pulse: (!) 58 (!) 57 (!) 56 (!) 54  Resp: 18 16  18   Temp: 98.6 F (37 C) 98.8 F (37.1 C)  98.5 F (36.9 C)  TempSrc: Oral Oral  Oral  SpO2: 97% 97% 97% 100%  Weight:      Height:        Intake/Output Summary (Last 24 hours) at 05/08/2020 1322 Last data filed at 05/08/2020 00932Gross per 24 hour  Intake 3159.36 ml  Output --  Net 3159.36 ml   Filed Weights   05/05/20 2039  Weight: 67.6 kg    Examination:  General: 59y.o. female resting in bed in NAD Cardiovascular: RRR, +S1, S2, no m/g/r, equal pulses  throughout Respiratory: CTABL, no w/r/r, normal WOB GI: BS+, NDNT, no masses noted, no organomegaly noted MSK: No e/c/c Neuro: Alert to name, follows commands Psyc: Appropriate interaction and affect, calm/cooperative   Data Reviewed: I have personally reviewed following labs and imaging studies.  CBC: Recent Labs  Lab 05/05/20 1539 05/06/20 0525 05/07/20 0500 05/08/20 0500  WBC 6.0 10.5 6.4 4.0  NEUTROABS 5.4  --  4.9 2.5  HGB 8.7* 8.2* 8.3* 9.1*  HCT 26.6* 25.3* 26.3* 28.3*  MCV 95.7 97.7 97.8 96.3  PLT 123* 102* 107* 1671   Basic Metabolic Panel: Recent Labs  Lab 05/05/20 1539 05/06/20 0525 05/07/20 0500 05/08/20 0500  NA 138 137 138 140  K 3.2* 4.0 4.4 3.6  CL 102 103 106 104  CO2 28 25 23 26   GLUCOSE 85 77 76 99  BUN 50* 45* 29* 18  CREATININE 1.88* 1.64* 1.63* 1.61*  CALCIUM 8.5* 8.6* 9.2 9.0  MG 2.1  --  1.8 1.7   GFR: Estimated Creatinine Clearance: 38 mL/min (A) (by C-G formula based on SCr of 1.61 mg/dL (H)). Liver Function Tests: Recent Labs  Lab 05/05/20 1539 05/06/20 0525 05/07/20 0500 05/08/20 0500  AST 637* 442* 171* 88*  ALT 604* 640* 441* 333*  ALKPHOS 187* 186* 163* 186*  BILITOT 0.9 1.1 1.1 1.3*  PROT 6.7 6.6 6.6 7.4  ALBUMIN 3.3* 3.2* 3.1* 3.3*   Recent Labs  Lab 05/05/20 2119  LIPASE 19   No results for input(s): AMMONIA in the last 168 hours. Coagulation Profile: No results for input(s): INR, PROTIME in the last 168 hours. Cardiac Enzymes: No results for input(s): CKTOTAL, CKMB, CKMBINDEX, TROPONINI in the last 168 hours. BNP (last 3 results) No results for input(s): PROBNP in the last 8760 hours. HbA1C: No results for input(s): HGBA1C in the last 72 hours. CBG: No results for input(s): GLUCAP in the last 168 hours. Lipid Profile: No results for input(s): CHOL, HDL, LDLCALC, TRIG, CHOLHDL, LDLDIRECT in the last 72 hours. Thyroid Function Tests: No results for input(s): TSH, T4TOTAL, FREET4, T3FREE, THYROIDAB in the last  72 hours. Anemia Panel: No results for input(s): VITAMINB12, FOLATE, FERRITIN, TIBC, IRON, RETICCTPCT in the last 72 hours. Sepsis Labs: Recent Labs  Lab 05/05/20 1539  LATICACIDVEN 0.8    Recent Results (from the past 240 hour(s))  SARS Coronavirus 2 by RT PCR (hospital order, performed in [facility] hospital lab) Nasopharyngeal Nasopharyngeal Swab     Status: None   Collection Time: 05/05/20  3:23 PM   Specimen: Nasopharyngeal Swab  Result Value Ref Range Status   SARS Coronavirus 2 NEGATIVE NEGATIVE Final    Comment: (NOTE) SARS-CoV-2 target nucleic acids are NOT DETECTED.  The SARS-CoV-2 RNA is generally detectable in upper and lower respiratory specimens during the acute phase of infection. The lowest concentration of SARS-CoV-2 viral copies this assay can detect is 250 copies / mL. A negative result does not preclude SARS-CoV-2 infection and should not be used as the sole basis for treatment or other patient management decisions.  A negative result may occur with improper specimen collection / handling, submission of specimen other than nasopharyngeal swab, presence of viral mutation(s) within the areas targeted by this assay, and inadequate number of viral copies (<250 copies / mL). A negative result must be combined with clinical observations, patient history, and epidemiological information.  Fact Sheet for Patients:   StrictlyIdeas.no  Fact Sheet for Healthcare Providers: BankingDealers.co.za  This test is not yet approved or  cleared by the [place] FDA and has been authorized for detection and/or diagnosis of SARS-CoV-2 by FDA under an Emergency Use Authorization (EUA).  This EUA will remain in effect (meaning this test can be used) for the duration of the COVID-19 declaration under Section 564(b)(1) of the Act, 21 U.S.C. section 360bbb-3(b)(1), unless the authorization is terminated or revoked  sooner.  Performed at [facility], [place] [address] [place], [place] [address]   Blood culture (routine x 2)     Status: Abnormal   Collection Time: 05/05/20  3:39 PM   Specimen: BLOOD  Result Value Ref Range Status   Specimen Description   Final    BLOOD PICC LINE Performed at [facility] [address], [place], [place] [address]    Special Requests   Final    BOTTLES DRAWN AEROBIC AND ANAEROBIC Blood Culture adequate volume Performed at [address] [place], [place] [address]    Culture  Setup Time (A)  Final    GRAM VARIABLE ROD IN BOTH AEROBIC AND ANAEROBIC BOTTLES CRITICAL RESULT CALLED TO, READ BACK BY AND VERIFIED WITH: PHARMD M HICKS [id number] AT 920 AM BY CM Performed at [facility], [address] [place], [place] [address]    Culture PSEUDOMONAS PUTIDA (A)  Final   Report Status 05/08/2020 FINAL  Final   Organism ID, Bacteria PSEUDOMONAS PUTIDA  Final      Susceptibility   Pseudomonas putida - MIC*    CEFTAZIDIME 2 SENSITIVE Sensitive     CIPROFLOXACIN <=0.25 SENSITIVE Sensitive     GENTAMICIN <=1 SENSITIVE Sensitive     IMIPENEM 1 SENSITIVE Sensitive     PIP/TAZO 8 SENSITIVE Sensitive     CEFEPIME <=1 SENSITIVE Sensitive     * PSEUDOMONAS PUTIDA  Blood Culture ID Panel (Reflexed)     Status: None   Collection Time: 05/05/20  3:39 PM  Result Value Ref Range Status   Enterococcus species NOT DETECTED NOT DETECTED Final   Listeria monocytogenes NOT DETECTED NOT DETECTED Final   Staphylococcus species NOT DETECTED NOT DETECTED Final   Staphylococcus aureus (BCID) NOT DETECTED NOT DETECTED Final   Streptococcus species NOT DETECTED NOT DETECTED Final   Streptococcus agalactiae NOT DETECTED NOT DETECTED Final   Streptococcus pneumoniae NOT DETECTED NOT DETECTED Final   Streptococcus pyogenes NOT DETECTED NOT DETECTED Final   Acinetobacter baumannii NOT DETECTED NOT DETECTED Final    Enterobacteriaceae species NOT DETECTED NOT DETECTED Final   Enterobacter cloacae complex NOT DETECTED NOT DETECTED Final   Escherichia coli NOT DETECTED NOT DETECTED Final   Klebsiella oxytoca NOT DETECTED NOT DETECTED Final   Klebsiella pneumoniae NOT DETECTED NOT DETECTED Final   Proteus species NOT DETECTED NOT DETECTED Final   Serratia marcescens NOT DETECTED NOT DETECTED Final   Haemophilus influenzae NOT DETECTED NOT DETECTED Final   Neisseria meningitidis NOT DETECTED NOT DETECTED Final   Pseudomonas aeruginosa NOT DETECTED NOT DETECTED Final   Candida albicans NOT DETECTED NOT DETECTED Final   Candida glabrata NOT DETECTED NOT DETECTED Final   Candida krusei NOT DETECTED NOT DETECTED Final   Candida parapsilosis NOT DETECTED NOT DETECTED Final   Candida tropicalis NOT DETECTED NOT DETECTED Final    Comment: Performed at [facility], [place] [address], [place], [place] [address]  Blood culture (routine x 2)     Status: Abnormal   Collection Time: 05/05/20  3:55 PM   Specimen: BLOOD  Result Value Ref Range Status   Specimen Description   Final    BLOOD PICC LINE Performed at [facility] [address] [place], [place] [address]    Special Requests   Final    BOTTLES DRAWN AEROBIC AND ANAEROBIC Blood Culture adequate volume Performed at [facility] [address], [place], [place] [address]    Culture  Setup Time (A)  Final    GRAM VARIABLE ROD AEROBIC BOTTLE ONLY CRITICAL VALUE NOTED.  VALUE IS CONSISTENT WITH PREVIOUSLY REPORTED AND CALLED VALUE.    Culture (A)  Final    PSEUDOMONAS PUTIDA SUSCEPTIBILITIES PERFORMED ON PREVIOUS CULTURE WITHIN THE LAST 5 DAYS. Performed at [facility], [place] [address], [place], [place] [address]    Report Status 05/08/2020 FINAL  Final  C Difficile Quick Screen w PCR reflex     Status: None   Collection Time: 05/06/20  3:58 PM  Specimen: Stool  Result Value Ref Range Status   C  Diff antigen NEGATIVE NEGATIVE Final   C Diff toxin NEGATIVE NEGATIVE Final   C Diff interpretation No C. difficile detected.  Final    Comment: VALID Performed at [facility], [place] [address], [place], [place] [address]       Radiology Studies: US Abdomen Limited RUQ  Result Date: 05/06/2020 CLINICAL DATA:  Elevated LFTs. EXAM: ULTRASOUND ABDOMEN LIMITED RIGHT UPPER QUADRANT COMPARISON:  CT yesterday. FINDINGS: Gallbladder: Surgically absent. Common bile duct: Diameter: 5 mm. Liver: Pneumobilia on prior CT is not well demonstrated by ultrasound. Within normal limits in parenchymal echogenicity. Portal vein is patent on color Doppler imaging with normal direction of blood flow towards the liver. Other: Trace free fluid adjacent to the liver, no additional ascites. Increased echogenicity of the right kidney suggestive of medical renal disease. IMPRESSION: 1. No explanation for LFTs.  Normal hepatic echogenicity. 2. Pneumobilia on recent CT not well demonstrated sonographically. 3. Post cholecystectomy without biliary dilatation. Electronically Signed   By: Keith Rake M.D.   On: 05/06/2020 18:30     Scheduled Meds: . amLODipine  10 mg Oral Daily  . budesonide  6 mg Oral Daily  . buPROPion  200 mg Oral Daily  . Chlorhexidine Gluconate Cloth  6 each Topical Q0600  . cycloSPORINE  1 drop Both Eyes BID  . dicyclomine  20 mg Oral TID  . DULoxetine  90 mg Oral Daily  . heparin  5,000 Units Subcutaneous Q8H  . hydrALAZINE  25 mg Oral Q8H  . metoprolol tartrate  100 mg Oral BID  . pantoprazole  40 mg Oral Daily  . polyvinyl alcohol  1 drop Both Eyes QID  . sucralfate  1 g Oral QID   Continuous Infusions: . dextrose 5 % and 0.9% NaCl 75 mL/hr at 05/08/20 0557  . piperacillin-tazobactam (ZOSYN)  IV 3.375 g (05/08/20 0321)     LOS: 2 days    Time spent: 25 minutes spent in the coordination of care today.    Jonnie Finner, DO [facility]  If 7PM-7AM,  please contact night-coverage [email or web address] 05/08/2020, 1:22 PM

## 2020-05-08 NOTE — TOC Initial Note (Signed)
Transition of Care [facility]) - Initial/Assessment Note    Patient Details  Name: Caitlin Peters MRN: [id number] Date of Birth: September 27, 1961  Transition of Care (TOC) CM/SW Contact:    Joaquin Courts, RN Phone Number: 05/08/2020, 3:37 PM  Clinical Narrative:      CM spoke with MD, who reports patient will need home IV antibiotics.  Plan is to exchange PICC on Monday 05/09/20, with an anticipated dc date for Tuesday 6/15 at the earliest.  MD anticipated IV zosyn for three weeks.  Patient is currently on home TPN and is followed by Carolynn Sayers with [facility] and [facility]mings for [facility] services.  Carolynn Sayers notified of need for home antibiotics and will follow.               Expected Discharge Plan: [place] Barriers to Discharge: Continued Medical Work up   Patient Goals and CMS Choice Patient states their goals for this hospitalization and ongoing recovery are:: to go home CMS Medicare.gov Compare Post Acute Care list provided to:: Patient Choice offered to / list presented to : Patient  Expected Discharge Plan and Services Expected Discharge Plan: [place]   Discharge Planning Services: CM Consult Post Acute Care Choice: Resumption of Svcs/PTA Provider Living arrangements for the past 2 months: Apartment                           HH Arranged: RN, IV Antibiotics HH Agency: [facility], [facility] Date [facility] Agency Contacted: 05/08/20 Time HH Agency Contacted: 1536 Representative spoke with at [place]: Carolynn Sayers  Prior Living Arrangements/Services Living arrangements for the past 2 months: [place] with:: Self Patient language and need for interpreter reviewed:: Yes Do you feel safe going back to the place where you live?: Yes      Need for Family Participation in Patient Care: Yes (Comment) Care giver support system in place?: Yes (comment)   Criminal Activity/Legal Involvement Pertinent to Current  Situation/Hospitalization: No - Comment as needed  Activities of Daily Living Home Assistive Devices/Equipment: Walker (specify type) ADL Screening (condition at time of admission) Patient's cognitive ability adequate to safely complete daily activities?: Yes Is the patient deaf or have difficulty hearing?: No Does the patient have difficulty seeing, even when wearing glasses/contacts?: No Does the patient have difficulty concentrating, remembering, or making decisions?: No Patient able to express need for assistance with ADLs?: Yes Does the patient have difficulty dressing or bathing?: No Independently performs ADLs?: Yes (appropriate for developmental age) Does the patient have difficulty walking or climbing stairs?: Yes Weakness of Legs: Both Weakness of Arms/Hands: Both  Permission Sought/Granted                  Emotional Assessment              Admission diagnosis:  Hypokalemia [E87.6] Short gut syndrome [K91.2] Lower abdominal pain [R10.30] Fever [R50.9] Febrile illness [R50.9] Hx of Crohn's disease [Z87.19] Hx of bacteremia [Z87.898] Fever, unspecified fever cause [R50.9] Acute renal failure superimposed on chronic kidney disease, unspecified CKD stage, unspecified acute renal failure type ([place]) [N17.9, N18.9] Febrile illness, acute [R50.9] Patient Active Problem List   Diagnosis Date Noted  . Febrile illness, acute 05/06/2020  . Febrile illness 05/05/2020  . Acute pancreatitis 04/13/2020  . Intractable nausea and vomiting 04/13/2020  . Infection due to Stenotrophomonas maltophilia 03/10/2020  . Sepsis ([place]) 03/05/2020  . Abnormal LFTs  03/05/2020  . Pancytopenia ([place]) 03/05/2020  . Stage 3b chronic kidney disease 03/05/2020  . Bacteremia   . Infection of peripherally inserted central venous catheter (PICC)   . On total parenteral nutrition (TPN) 01/02/2020  . Elevated liver enzymes 01/02/2020  . Cholangitis   . Generalized abdominal pain   .  Intractable abdominal pain 11/25/2019  . Anasarca 10/28/2019  . Acute kidney injury superimposed on chronic kidney disease ([place]) 10/28/2019  . Falls 10/28/2019  . Crohn disease ([place])   . Malnutrition ([place])   . Short gut syndrome   . HTN (hypertension)   . Vitamin B12 deficiency   . Osteoporosis   . Depression   . GERD (gastroesophageal reflux disease)   . Chronic pain syndrome   . Hypokalemia due to excessive gastrointestinal loss of potassium 10/13/2019  . Diarrhea   . Fever   . Hypokalemia 10/12/2019  . Chronic diarrhea 10/12/2019  . Dysuria 10/12/2019  . Bilateral lower extremity edema 10/12/2019  . Crohn's colitis ([place]) 10/12/2019  . Anemia 10/12/2019  . Hypertension 10/12/2019  . AKI (acute kidney injury) ([place]) 10/12/2019   PCP:  [name], [name], MD Pharmacy:   [facility], [place] [place] [address] Phone: [phone] Fax: [phone]     Social Determinants of Health (SDOH) Interventions    Readmission Risk Interventions Readmission Risk Prevention Plan 11/30/2019 10/30/2019  Transportation Screening Complete Complete  PCP or Specialist Appt within 3-5 Days - Not Complete  Not Complete comments - pending d/c date to arrange  [place] or [place] - Complete  Social Work Consult for [place] Planning/Counseling - Not Complete  SW consult not completed comments - RNCM met with pt  Palliative Care Screening - Not Applicable  Medication Review Press photographer) Complete Complete  PCP or Specialist appointment within 3-5 days of discharge Complete -  [place] or [place] Complete -  SW Recovery Care/Counseling Consult Complete -  Palliative Care Screening Not Applicable -  [place] Not Applicable -

## 2020-05-09 ENCOUNTER — Other Ambulatory Visit (HOSPITAL_COMMUNITY): Payer: Medicare Other

## 2020-05-09 ENCOUNTER — Inpatient Hospital Stay (HOSPITAL_COMMUNITY): Payer: Medicare Other

## 2020-05-09 HISTORY — PX: IR FLUORO GUIDE CV LINE LEFT: IMG2282

## 2020-05-09 LAB — CBC WITH DIFFERENTIAL/PLATELET
Abs Immature Granulocytes: 0 10*3/uL (ref 0.00–0.07)
Basophils Absolute: 0 10*3/uL (ref 0.0–0.1)
Basophils Relative: 0 %
Eosinophils Absolute: 0.1 10*3/uL (ref 0.0–0.5)
Eosinophils Relative: 2 %
HCT: 28.2 % — ABNORMAL LOW (ref 36.0–46.0)
Hemoglobin: 9.1 g/dL — ABNORMAL LOW (ref 12.0–15.0)
Immature Granulocytes: 0 %
Lymphocytes Relative: 42 %
Lymphs Abs: 1.2 10*3/uL (ref 0.7–4.0)
MCH: 30.7 pg (ref 26.0–34.0)
MCHC: 32.3 g/dL (ref 30.0–36.0)
MCV: 95.3 fL (ref 80.0–100.0)
Monocytes Absolute: 0.2 10*3/uL (ref 0.1–1.0)
Monocytes Relative: 7 %
Neutro Abs: 1.4 10*3/uL — ABNORMAL LOW (ref 1.7–7.7)
Neutrophils Relative %: 49 %
Platelets: 128 10*3/uL — ABNORMAL LOW (ref 150–400)
RBC: 2.96 MIL/uL — ABNORMAL LOW (ref 3.87–5.11)
RDW: 13.2 % (ref 11.5–15.5)
WBC: 2.8 10*3/uL — ABNORMAL LOW (ref 4.0–10.5)
nRBC: 0 % (ref 0.0–0.2)

## 2020-05-09 LAB — COMPREHENSIVE METABOLIC PANEL
ALT: 235 U/L — ABNORMAL HIGH (ref 0–44)
AST: 49 U/L — ABNORMAL HIGH (ref 15–41)
Albumin: 3 g/dL — ABNORMAL LOW (ref 3.5–5.0)
Alkaline Phosphatase: 183 U/L — ABNORMAL HIGH (ref 38–126)
Anion gap: 9 (ref 5–15)
BUN: 13 mg/dL (ref 6–20)
CO2: 24 mmol/L (ref 22–32)
Calcium: 8.9 mg/dL (ref 8.9–10.3)
Chloride: 107 mmol/L (ref 98–111)
Creatinine, Ser: 1.72 mg/dL — ABNORMAL HIGH (ref 0.44–1.00)
GFR calc Af Amer: 37 mL/min — ABNORMAL LOW (ref >60)
GFR calc non Af Amer: 32 mL/min — ABNORMAL LOW (ref >60)
Glucose, Bld: 108 mg/dL — ABNORMAL HIGH (ref 70–99)
Potassium: 3.3 mmol/L — ABNORMAL LOW (ref 3.5–5.1)
Sodium: 140 mmol/L (ref 135–145)
Total Bilirubin: 0.9 mg/dL (ref 0.3–1.2)
Total Protein: 6.8 g/dL (ref 6.5–8.1)

## 2020-05-09 LAB — MAGNESIUM: Magnesium: 1.7 mg/dL (ref 1.7–2.4)

## 2020-05-09 MED ORDER — FENTANYL CITRATE (PF) 100 MCG/2ML IJ SOLN
INTRAMUSCULAR | Status: AC | PRN
  Administered 2020-05-09 (×2): 50 ug via INTRAVENOUS

## 2020-05-09 MED ORDER — MIDAZOLAM HCL 2 MG/2ML IJ SOLN
INTRAMUSCULAR | Status: AC
  Filled 2020-05-09: qty 2

## 2020-05-09 MED ORDER — LIDOCAINE HCL 1 % IJ SOLN
INTRAMUSCULAR | Status: AC
  Filled 2020-05-09: qty 20

## 2020-05-09 MED ORDER — MIDAZOLAM HCL 2 MG/2ML IJ SOLN
INTRAMUSCULAR | Status: AC | PRN
  Administered 2020-05-09 (×2): 1 mg via INTRAVENOUS

## 2020-05-09 MED ORDER — HYDRALAZINE HCL 50 MG PO TABS
50.0000 mg | ORAL_TABLET | Freq: Three times a day (TID) | ORAL | Status: DC
  Administered 2020-05-09 – 2020-05-10 (×4): 50 mg via ORAL
  Filled 2020-05-09 (×5): qty 1

## 2020-05-09 MED ORDER — LIDOCAINE-EPINEPHRINE (PF) 1 %-1:200000 IJ SOLN
INTRAMUSCULAR | Status: AC | PRN
  Administered 2020-05-09: 5 mL

## 2020-05-09 MED ORDER — FENTANYL CITRATE (PF) 100 MCG/2ML IJ SOLN
INTRAMUSCULAR | Status: AC
  Filled 2020-05-09: qty 2

## 2020-05-09 MED ORDER — POTASSIUM CHLORIDE 10 MEQ/100ML IV SOLN
10.0000 meq | INTRAVENOUS | Status: AC
  Administered 2020-05-09 (×4): 10 meq via INTRAVENOUS
  Filled 2020-05-09 (×4): qty 100

## 2020-05-09 NOTE — Progress Notes (Signed)
PHARMACY CONSULT NOTE FOR:  OUTPATIENT  PARENTERAL ANTIBIOTIC THERAPY (OPAT)  Indication: Pseudomonas Putida bacteremia Regimen: Infuse piperacillin/tazobactam 13.5gm daily as continuous infusion over 24h End date: 05/30/2020  IV antibiotic discharge orders are pended. To discharging provider:  please sign these orders via discharge navigator,  Select New Orders & click on the button choice - Manage This Unsigned Work.     Thank you for allowing pharmacy to be a part of this patient's care.  Doreene Eland, PharmD, BCPS.   Work Cell: [phone] 05/09/2020 12:49 PM

## 2020-05-09 NOTE — Care Management Important Message (Signed)
Important Message  Patient Details IM Letter given to Marney Doctor RN Case Manager to present to the Patient Name: Caitlin Peters MRN: [id number] Date of Birth: [birth year]   Medicare Important Message Given:  Yes     [name] 05/09/2020, 9:52 AM

## 2020-05-09 NOTE — Procedures (Signed)
Interventional Radiology Procedure Note  Procedure: Exchange of left femoral tunneled PowerPICC  Complications: None  Estimated Blood Loss: None  Recommendations: - Catheter ready for use   Signed,  Criselda Peaches, MD

## 2020-05-09 NOTE — Progress Notes (Signed)
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  Patient reports multiple hospitalizations for recurrent Crohn's disease related abdominal pain and frequent PICC line infections. She is most recently hospitalized from 04/13/2020-04/18/2020 for significant nausea, vomiting, diarrhea, and abdominal pain. She was managed with supportive care and discharged home.  Patient states she has been managing fairly well since then until earlier today (05/05/2020) when she developed a fever up to 102 Fahrenheit. She had associated chills and body aches. She has continued nausea and abdominal pain however feels this is less severe and less frequent than when she was hospitalized last time. She feels that her left femoral PICC line is functioning well and has not seen any skin changes or obvious wounds. She denies any associated cough, chest pain, dyspnea, dysuria. She reports chronic diarrhea which is becoming less frequent and only occurring up to 3 times per day now. She feels that she is not able to maintain adequate oral intake at this time.  6/13: She has had great difficulty with PICC access in the past. Spoke with IR. Will attempt PICC exchange instead for concern of loss of access altogether. Rpt Bld Cx. Start CLD and advance as tolerated. Spoke with ID. Will continue zosyn for an additional 3 weeks. They will set up follow up in their clinic.  6/14: PICC exchange today. 3 weeks of zosyn start today. Check echo prior to discharge.  Repeat Bld Cx are negative to date. Let's get at least another day of negative bld Cx result and we can look for discharge.    Assessment & Plan:   Principal Problem:   Febrile illness Active Problems:   Hypokalemia   Crohn disease ([place])   Short gut syndrome   HTN (hypertension)   Depression   Acute kidney injury superimposed on chronic kidney disease ([name])   On total parenteral nutrition (TPN)   Abnormal LFTs   Febrile illness, acute  Febrile illness Bacteremia, Pseudomonas Putida - gram variable bacteremia - continue zosyn for now - fever is improved -6/12: Pseudomonas Putida growing in Bld Cx. Awaiting sensitivities. Needs PICC holiday. I have spoken with IR. Appreciate assistance. Continue zosyn for now.     - 6/13: to have PICC exchange tomorrow (essentially down to her last usable spot and their is significant concern that she would not regain access if the PICC was pulled). D/t this, she will need 3 weeks zosyn. ID will follow up in clinic.      - 6/14: check echo; PICC exchange today (this starts 3 weeks zosyn); rpt bld cx are neg to date; hopeful for discharge in next 24 -48 hours.  Extensive Crohn's disease s/p multiple bowel resections and resultant short bowel syndrome now TPN dependent with chronic abdominal pain: - Follows with [facility], Dr. Domenica Fail. - Keep n.p.o. for now except sips with meds - Continue holding TPN for now - Continue pain control with as needed oral tramadol and IV Dilaudid 2 mg q3h only for severe pain with hold parameters - LR at 125cc/hr, follow - 6/13: CLD, advance as tolerated. D5NS running at  50cc/hr.  AKI on CKD stage III: - Creatinine 1.88 on admission compared to 1.26 on 04/18/2020. - 6/14: Scr stable at 1.7  Abnormal LFTs: - at admission: AST 637, ALT 604, alk phos 187 with total bilirubin 0.9.  - CT abdomen/pelvis shows stable chronic changes without acute findings. -  Hepatitis panel is negative - RUQ [place] pending - 6/12: LFTs improved. RUQ noted. Follow.  Chronic pancreatic duct dilatation: - Seen again on CT abdomen/pelvis and appears stable.  Hypokalemia - Mg2+ ok, replace K+ today  Hypertension: - Continue amlodipine and Lopressor. - BP is acceptable - 6/13: schedule hydralazine 18m TID  Anemia of chronic disease and iron deficiency: - no evidence of bleed, monitor  Depression: - continue Wellbutrin and Cymbalta.  Chronic pain: - continue tramadol and IV Dilaudid as needed with hold parameters as above.  DVT prophylaxis: heparin Code Status: FULL   Status is: Inpatient  Remains inpatient appropriate because:IV treatments appropriate due to intensity of illness or inability to take PO   Dispo: The patient is from: Home              Anticipated d/c is to: Home              Anticipated d/c date is: 1 day              Patient currently is not medically stable to d/c.  Consultants:   IR  ID  Procedures:   PICC Exchange 6/14  Antimicrobials:  . zosyn   ROS:  Denies CP, N, V, ab pain . Remainder 10-pt ROS is negative for all not previously mentioned.  Subjective: "You think I'll be able to go tomorrow?"  Objective: Vitals:   05/09/20 0919 05/09/20 0925 05/09/20 0930 05/09/20 1012  BP: (!) 193/92 (!) 151/97  (!) 166/71  Pulse: 60 62 (!) 56 (!) 51  Resp: 19 (!) 28 (!) 29 16  Temp:    98.4 F (36.9 C)  TempSrc:    Oral  SpO2: 100% 97% 100% 97%  Weight:      Height:        Intake/Output Summary (Last 24 hours) at 05/09/2020 1340 Last data filed at 05/08/2020 1825 Gross per 24 hour  Intake 2746.43 ml  Output --  Net 2746.43 ml   Filed Weights   05/05/20 2039  Weight: 67.6 kg    Examination:  General: 59y.o. female resting in bed in NAD Cardiovascular: RRR, +S1, S2, no m/g/r, equal pulses throughout Respiratory: CTABL, no w/r/r, normal WOB GI: BS+, NDNT,  soft MSK: No e/c/c Neuro: A&O x 3, no focal deficits Psyc: Appropriate interaction and affect, calm/cooperative   Data Reviewed: I have personally reviewed following labs and imaging studies.  CBC: Recent Labs  Lab 05/05/20 1539 05/06/20 0525 05/07/20 0500 05/08/20 0500 05/09/20 0500  WBC 6.0 10.5 6.4 4.0 2.8*  NEUTROABS 5.4  --  4.9 2.5 1.4*  HGB 8.7* 8.2* 8.3* 9.1* 9.1*  HCT 26.6* 25.3* 26.3* 28.3* 28.2*  MCV 95.7 97.7 97.8 96.3 95.3  PLT 123* 102* 107* 121* 1250   Basic Metabolic Panel: Recent Labs  Lab 05/05/20 1539 05/06/20 0525 05/07/20 0500 05/08/20 0500 05/09/20 0500  NA 138 137 138 140 140  K 3.2* 4.0 4.4 3.6 3.3*  CL 102 103 106 104 107  CO2 28 25 23 26 24   GLUCOSE 85 77 76 99 108*  BUN 50* 45* 29* 18 13  CREATININE 1.88* 1.64* 1.63* 1.61* 1.72*  CALCIUM 8.5* 8.6* 9.2  9.0 8.9  MG 2.1  --  1.8 1.7 1.7   GFR: Estimated Creatinine Clearance: 35.5 mL/min (A) (by C-G formula based on SCr of 1.72 mg/dL (H)). Liver Function Tests: Recent Labs  Lab 05/05/20 1539 05/06/20 0525 05/07/20 0500 05/08/20 0500 05/09/20 0500  AST 637* 442* 171* 88* 49*  ALT 604* 640* 441* 333* 235*  ALKPHOS 187* 186* 163* 186* 183*  BILITOT 0.9 1.1 1.1 1.3* 0.9  PROT 6.7 6.6 6.6 7.4 6.8  ALBUMIN 3.3* 3.2* 3.1* 3.3* 3.0*   Recent Labs  Lab 05/05/20 2119  LIPASE 19   No results for input(s): AMMONIA in the last 168 hours. Coagulation Profile: No results for input(s): INR, PROTIME in the last 168 hours. Cardiac Enzymes: No results for input(s): CKTOTAL, CKMB, CKMBINDEX, TROPONINI in the last 168 hours. BNP (last 3 results) No results for input(s): PROBNP in the last 8760 hours. HbA1C: No results for input(s): HGBA1C in the last 72 hours. CBG: No results for input(s): GLUCAP in the last 168 hours. Lipid Profile: No results for input(s): CHOL, HDL, LDLCALC, TRIG, CHOLHDL, LDLDIRECT in the last 72 hours. Thyroid Function Tests: No results for input(s): TSH, T4TOTAL,  FREET4, T3FREE, THYROIDAB in the last 72 hours. Anemia Panel: No results for input(s): VITAMINB12, FOLATE, FERRITIN, TIBC, IRON, RETICCTPCT in the last 72 hours. Sepsis Labs: Recent Labs  Lab 05/05/20 1539  LATICACIDVEN 0.8    Recent Results (from the past 240 hour(s))  SARS Coronavirus 2 by RT PCR (hospital order, performed in [facility] hospital lab) Nasopharyngeal Nasopharyngeal Swab     Status: None   Collection Time: 05/05/20  3:23 PM   Specimen: Nasopharyngeal Swab  Result Value Ref Range Status   SARS Coronavirus 2 NEGATIVE NEGATIVE Final    Comment: (NOTE) SARS-CoV-2 target nucleic acids are NOT DETECTED.  The SARS-CoV-2 RNA is generally detectable in upper and lower respiratory specimens during the acute phase of infection. The lowest concentration of SARS-CoV-2 viral copies this assay can detect is 250 copies / mL. A negative result does not preclude SARS-CoV-2 infection and should not be used as the sole basis for treatment or other patient management decisions.  A negative result may occur with improper specimen collection / handling, submission of specimen other than nasopharyngeal swab, presence of viral mutation(s) within the areas targeted by this assay, and inadequate number of viral copies (<250 copies / mL). A negative result must be combined with clinical observations, patient history, and epidemiological information.  Fact Sheet for Patients:   StrictlyIdeas.no  Fact Sheet for Healthcare Providers: BankingDealers.co.za  This test is not yet approved or  cleared by the [place] FDA and has been authorized for detection and/or diagnosis of SARS-CoV-2 by FDA under an Emergency Use Authorization (EUA).  This EUA will remain in effect (meaning this test can be used) for the duration of the COVID-19 declaration under Section 564(b)(1) of the Act, 21 U.S.C. section 360bbb-3(b)(1), unless the authorization is  terminated or revoked sooner.  Performed at [facility], [place] [address] [place], [place] [address]   Blood culture (routine x 2)     Status: Abnormal   Collection Time: 05/05/20  3:39 PM   Specimen: BLOOD  Result Value Ref Range Status   Specimen Description   Final    BLOOD PICC LINE Performed at [place] [address] [place], [place] [address]    Special Requests   Final    BOTTLES DRAWN AEROBIC AND ANAEROBIC Blood  Culture adequate volume Performed at [place] [address] [place], [place] [address]    Culture  Setup Time (A)  Final    GRAM VARIABLE ROD IN BOTH AEROBIC AND ANAEROBIC BOTTLES CRITICAL RESULT CALLED TO, READ BACK BY AND VERIFIED WITH: PHARMD M HICKS [id number] AT 920 AM BY CM Performed at [facility], [place] [address], [place], [place] [address]    Culture PSEUDOMONAS PUTIDA (A)  Final   Report Status 05/08/2020 FINAL  Final   Organism ID, Bacteria PSEUDOMONAS PUTIDA  Final      Susceptibility   Pseudomonas putida - MIC*    CEFTAZIDIME 2 SENSITIVE Sensitive     CIPROFLOXACIN <=0.25 SENSITIVE Sensitive     GENTAMICIN <=1 SENSITIVE Sensitive     IMIPENEM 1 SENSITIVE Sensitive     PIP/TAZO 8 SENSITIVE Sensitive     CEFEPIME <=1 SENSITIVE Sensitive     * PSEUDOMONAS PUTIDA  Blood Culture ID Panel (Reflexed)     Status: None   Collection Time: 05/05/20  3:39 PM  Result Value Ref Range Status   Enterococcus species NOT DETECTED NOT DETECTED Final   Listeria monocytogenes NOT DETECTED NOT DETECTED Final   Staphylococcus species NOT DETECTED NOT DETECTED Final   Staphylococcus aureus (BCID) NOT DETECTED NOT DETECTED Final   Streptococcus species NOT DETECTED NOT DETECTED Final   Streptococcus agalactiae NOT DETECTED NOT DETECTED Final   Streptococcus pneumoniae NOT DETECTED NOT DETECTED Final   Streptococcus pyogenes NOT DETECTED NOT DETECTED Final   Acinetobacter baumannii NOT DETECTED  NOT DETECTED Final   Enterobacteriaceae species NOT DETECTED NOT DETECTED Final   Enterobacter cloacae complex NOT DETECTED NOT DETECTED Final   Escherichia coli NOT DETECTED NOT DETECTED Final   Klebsiella oxytoca NOT DETECTED NOT DETECTED Final   Klebsiella pneumoniae NOT DETECTED NOT DETECTED Final   Proteus species NOT DETECTED NOT DETECTED Final   Serratia marcescens NOT DETECTED NOT DETECTED Final   Haemophilus influenzae NOT DETECTED NOT DETECTED Final   Neisseria meningitidis NOT DETECTED NOT DETECTED Final   Pseudomonas aeruginosa NOT DETECTED NOT DETECTED Final   Candida albicans NOT DETECTED NOT DETECTED Final   Candida glabrata NOT DETECTED NOT DETECTED Final   Candida krusei NOT DETECTED NOT DETECTED Final   Candida parapsilosis NOT DETECTED NOT DETECTED Final   Candida tropicalis NOT DETECTED NOT DETECTED Final    Comment: Performed at [facility], [place] [address] [place], [place] [address]  Blood culture (routine x 2)     Status: Abnormal   Collection Time: 05/05/20  3:55 PM   Specimen: BLOOD  Result Value Ref Range Status   Specimen Description   Final    BLOOD PICC LINE Performed at [facility] [address] [place], [place] [address]    Special Requests   Final    BOTTLES DRAWN AEROBIC AND ANAEROBIC Blood Culture adequate volume Performed at [facility] [place] [address] [place], [place] [address]    Culture  Setup Time (A)  Final    GRAM VARIABLE ROD AEROBIC BOTTLE ONLY CRITICAL VALUE NOTED.  VALUE IS CONSISTENT WITH PREVIOUSLY REPORTED AND CALLED VALUE.    Culture (A)  Final    PSEUDOMONAS PUTIDA SUSCEPTIBILITIES PERFORMED ON PREVIOUS CULTURE WITHIN THE LAST 5 DAYS. Performed at [facility], [place] [address], [place], [place] [address]    Report Status 05/08/2020 FINAL  Final  Gastrointestinal Panel by PCR , Stool  Status: None   Collection Time: 05/06/20  3:58 PM   Specimen: Stool  Result Value  Ref Range Status   Campylobacter species NOT DETECTED NOT DETECTED Final   Plesimonas shigelloides NOT DETECTED NOT DETECTED Final   Salmonella species NOT DETECTED NOT DETECTED Final   Yersinia enterocolitica NOT DETECTED NOT DETECTED Final   Vibrio species NOT DETECTED NOT DETECTED Final   Vibrio cholerae NOT DETECTED NOT DETECTED Final   Enteroaggregative E coli (EAEC) NOT DETECTED NOT DETECTED Final   Enteropathogenic E coli (EPEC) NOT DETECTED NOT DETECTED Final   Enterotoxigenic E coli (ETEC) NOT DETECTED NOT DETECTED Final   Shiga like toxin producing E coli (STEC) NOT DETECTED NOT DETECTED Final   Shigella/Enteroinvasive E coli (EIEC) NOT DETECTED NOT DETECTED Final   Cryptosporidium NOT DETECTED NOT DETECTED Final   Cyclospora cayetanensis NOT DETECTED NOT DETECTED Final   Entamoeba histolytica NOT DETECTED NOT DETECTED Final   Giardia lamblia NOT DETECTED NOT DETECTED Final   Adenovirus F40/41 NOT DETECTED NOT DETECTED Final   Astrovirus NOT DETECTED NOT DETECTED Final   Norovirus GI/GII NOT DETECTED NOT DETECTED Final   Rotavirus A NOT DETECTED NOT DETECTED Final   Sapovirus (I, II, IV, and V) NOT DETECTED NOT DETECTED Final    Comment: Performed at [facility], [place] [address] [place], [place] [address]  C Difficile Quick Screen w PCR reflex     Status: None   Collection Time: 05/06/20  3:58 PM   Specimen: Stool  Result Value Ref Range Status   C Diff antigen NEGATIVE NEGATIVE Final   C Diff toxin NEGATIVE NEGATIVE Final   C Diff interpretation No C. difficile detected.  Final    Comment: VALID Performed at [facility], [place] [address] [place], [place] [address]   Culture, blood (routine x 2)     Status: None (Preliminary result)   Collection Time: 05/08/20  2:20 PM   Specimen: Right Antecubital; Blood  Result Value Ref Range Status   Specimen Description   Final    RIGHT ANTECUBITAL Performed at [facility]  [address] [place], [place] [address]    Special Requests   Final    BOTTLES DRAWN AEROBIC ONLY Blood Culture results may not be optimal due to an excessive volume of blood received in culture bottles Performed at [place] [address] [place], [place] [address]    Culture   Final    NO GROWTH < 24 HOURS Performed at [place] [address] [place], [place] [address]    Report Status PENDING  Incomplete  Culture, blood (routine x 2)     Status: None (Preliminary result)   Collection Time: 05/08/20  2:34 PM   Specimen: BLOOD RIGHT HAND  Result Value Ref Range Status   Specimen Description   Final    BLOOD RIGHT HAND Performed at [place] [address], [place], [place] [address]    Special Requests   Final    BOTTLES DRAWN AEROBIC ONLY Blood Culture adequate volume Performed at [facility] [address] [place], [place] [address]    Culture   Final    NO GROWTH < 24 HOURS Performed at [facility] [address] [place], [place] [address]    Report Status PENDING  Incomplete      Radiology Studies: IR Fluoro Guide CV Line Left  Result Date: 05/09/2020 INDICATION: 59 year old female  with Crohn's disease and short gut syndrome chronically dependent upon TPN. She has had numerous long-term central venous access is dating back to 2003. She has reportedly occluded in her arms and neck region bilaterally. She currently has a chronic indwelling left femoral tunneled PICC which gets exchanged whenever she develops bacteremia. She is currently admitted with bacteremia and presents for line exchange. EXAM: IR LEFT FLUORO GUIDE CV LINE MEDICATIONS: Patient is currently an inpatient and receiving antibiotics. No additional antibiotic prophylaxis was administered. ANESTHESIA/SEDATION: Moderate (conscious) sedation was employed during this procedure. A total of Versed 2 mg and Fentanyl 100 mcg was  administered intravenously. Moderate Sedation Time: 10 minutes. [name] level of consciousness and vital signs were monitored continuously by radiology nursing throughout the procedure under my direct supervision. FLUOROSCOPY TIME:  Fluoroscopy Time: 0 minutes 12 seconds (1 mGy). COMPLICATIONS: None immediate. PROCEDURE: Informed written consent was obtained from the patient after a thorough discussion of the procedural risks, benefits and alternatives. All questions were addressed. Maximal Sterile Barrier Technique was utilized including caps, mask, sterile gowns, sterile gloves, sterile drape, hand hygiene and skin antiseptic. A timeout was performed prior to the initiation of the procedure. Local anesthesia was attained by infiltration with 1% lidocaine. The existing retention suture was cut and removed. The fabric cuff was freed from the surrounding soft tissues and pulled out of the skin. The PICC was then cut and removed over a wire. The tip was severed and reserved for culture. A new dual lumen PICC was cut to 40 cm. The peel-away sheath was advanced over the wire and into the common femoral vein. The wire was exchanged for a platinum plus wire. The PICC was then advanced over the wire and position with the tip in the inferior vena cava. The catheter flushed and aspirated easily. The catheter was flushed and capped before being secured to the skin with 0 Prolene suture. Sterile bandages were placed. IMPRESSION: Successful exchange for a new tunneled 40 cm dual lumen power PICC. The catheter tip is in the inferior vena cava and ready for immediate use. Electronically Signed   By: Jacqulynn Cadet M.D.   On: 05/09/2020 10:09     Scheduled Meds: . amLODipine  10 mg Oral Daily  . budesonide  6 mg Oral Daily  . buPROPion  200 mg Oral Daily  . Chlorhexidine Gluconate Cloth  6 each Topical Q0600  . cycloSPORINE  1 drop Both Eyes BID  . dicyclomine  20 mg Oral TID  . DULoxetine  90 mg Oral Daily  .  fentaNYL      . heparin  5,000 Units Subcutaneous Q8H  . hydrALAZINE  50 mg Oral Q8H  . lidocaine      . metoprolol tartrate  100 mg Oral BID  . midazolam      . pantoprazole  40 mg Oral Daily  . polyvinyl alcohol  1 drop Both Eyes QID  . sucralfate  1 g Oral QID   Continuous Infusions: . dextrose 5 % and 0.9% NaCl 50 mL/hr at 05/08/20 2019  . piperacillin-tazobactam (ZOSYN)  IV 3.375 g (05/09/20 1306)     LOS: 3 days    Time spent: 25 minutes spent in the coordination of care today.    Jonnie Finner, DO [facility]  If 7PM-7AM, please contact night-coverage [email or web address] 05/09/2020, 1:40 PM

## 2020-05-10 ENCOUNTER — Inpatient Hospital Stay (HOSPITAL_COMMUNITY): Payer: Medicare Other

## 2020-05-10 DIAGNOSIS — R7881 Bacteremia: Secondary | ICD-10-CM

## 2020-05-10 LAB — ECHOCARDIOGRAM COMPLETE
Height: 68 in
Weight: 2384 oz

## 2020-05-10 LAB — CBC WITH DIFFERENTIAL/PLATELET
Abs Immature Granulocytes: 0.01 10*3/uL (ref 0.00–0.07)
Basophils Absolute: 0 10*3/uL (ref 0.0–0.1)
Basophils Relative: 0 %
Eosinophils Absolute: 0.1 10*3/uL (ref 0.0–0.5)
Eosinophils Relative: 3 %
HCT: 29 % — ABNORMAL LOW (ref 36.0–46.0)
Hemoglobin: 9.5 g/dL — ABNORMAL LOW (ref 12.0–15.0)
Immature Granulocytes: 0 %
Lymphocytes Relative: 51 %
Lymphs Abs: 1.8 10*3/uL (ref 0.7–4.0)
MCH: 31 pg (ref 26.0–34.0)
MCHC: 32.8 g/dL (ref 30.0–36.0)
MCV: 94.8 fL (ref 80.0–100.0)
Monocytes Absolute: 0.2 10*3/uL (ref 0.1–1.0)
Monocytes Relative: 5 %
Neutro Abs: 1.4 10*3/uL — ABNORMAL LOW (ref 1.7–7.7)
Neutrophils Relative %: 41 %
Platelets: 132 10*3/uL — ABNORMAL LOW (ref 150–400)
RBC: 3.06 MIL/uL — ABNORMAL LOW (ref 3.87–5.11)
RDW: 13.3 % (ref 11.5–15.5)
WBC: 3.5 10*3/uL — ABNORMAL LOW (ref 4.0–10.5)
nRBC: 0 % (ref 0.0–0.2)

## 2020-05-10 LAB — COMPREHENSIVE METABOLIC PANEL
ALT: 182 U/L — ABNORMAL HIGH (ref 0–44)
AST: 37 U/L (ref 15–41)
Albumin: 3.3 g/dL — ABNORMAL LOW (ref 3.5–5.0)
Alkaline Phosphatase: 199 U/L — ABNORMAL HIGH (ref 38–126)
Anion gap: 8 (ref 5–15)
BUN: 11 mg/dL (ref 6–20)
CO2: 23 mmol/L (ref 22–32)
Calcium: 9.2 mg/dL (ref 8.9–10.3)
Chloride: 110 mmol/L (ref 98–111)
Creatinine, Ser: 1.72 mg/dL — ABNORMAL HIGH (ref 0.44–1.00)
GFR calc Af Amer: 37 mL/min — ABNORMAL LOW (ref >60)
GFR calc non Af Amer: 32 mL/min — ABNORMAL LOW (ref >60)
Glucose, Bld: 93 mg/dL (ref 70–99)
Potassium: 3.3 mmol/L — ABNORMAL LOW (ref 3.5–5.1)
Sodium: 141 mmol/L (ref 135–145)
Total Bilirubin: 1.1 mg/dL (ref 0.3–1.2)
Total Protein: 7 g/dL (ref 6.5–8.1)

## 2020-05-10 LAB — MAGNESIUM: Magnesium: 1.7 mg/dL (ref 1.7–2.4)

## 2020-05-10 MED ORDER — POTASSIUM CHLORIDE 10 MEQ/100ML IV SOLN
10.0000 meq | INTRAVENOUS | Status: AC
  Administered 2020-05-10 (×4): 10 meq via INTRAVENOUS
  Filled 2020-05-10 (×2): qty 100

## 2020-05-10 MED ORDER — HYDRALAZINE HCL 50 MG PO TABS: 50.0000 mg | ORAL_TABLET | Freq: Three times a day (TID) | ORAL | 0 refills | Status: DC

## 2020-05-10 MED ORDER — AMLODIPINE BESYLATE 10 MG PO TABS: 10.0000 mg | ORAL_TABLET | Freq: Every day | ORAL | 0 refills | Status: DC

## 2020-05-10 MED ORDER — PIPERACILLIN-TAZOBACTAM IV (FOR PTA / DISCHARGE USE ONLY)
13.5000 g | INTRAVENOUS | 0 refills | Status: AC
Start: 2020-05-10 — End: 2020-05-30

## 2020-05-10 NOTE — Progress Notes (Signed)
  Echocardiogram 2D Echocardiogram has been performed.  Caitlin Peters 05/10/2020, 2:27 PM

## 2020-05-10 NOTE — Discharge Summary (Signed)
Physician Discharge Summary  Caitlin Peters WPY:[id number] DOB: [birth year] DOA: 05/05/2020  PCP: [name], [name], MD  Admit date: 05/05/2020 Discharge date: 05/10/2020  Admitted From: Home Disposition:  Discharged to home.   Recommendations for Outpatient Follow-up:  1. Follow up with PCP in 1 weeks.  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  Patient reports multiple hospitalizations for recurrent Crohn's disease related abdominal pain and frequent PICC line infections. She is most recently hospitalized from 04/13/2020-04/18/2020 for significant nausea, vomiting, diarrhea, and abdominal pain. She was managed with supportive care and discharged home.  Patient states she has been managing fairly well since then until earlier today (05/05/2020) when she developed a fever up to 102 Fahrenheit. She had associated chills and body aches. She has continued nausea and abdominal pain however feels this is less severe and less frequent than when she was hospitalized last time. She feels that her left femoral PICC line is functioning well and has not seen any skin changes or obvious wounds. She denies any associated cough, chest pain, dyspnea, dysuria. She reports chronic diarrhea which is becoming less frequent and only occurring up to 3 times per day now. She feels that she is not able to maintain adequate oral intake at this time.  6/15: Repeat Bld Cx continue to be negative. Continue zosyn at discharge. Follow up with ID. Office will call with appt time.   Discharge  Diagnoses:  Principal Problem:   Febrile illness Active Problems:   Hypokalemia   Crohn disease ([place])   Short gut syndrome   HTN (hypertension)   Depression   Acute kidney injury superimposed on chronic kidney disease ([name])   On total parenteral nutrition (TPN)   Abnormal LFTs   Febrile illness, acute  Febrile illness Bacteremia, Pseudomonas Putida - gram variable bacteremia - continue zosyn for now - fever is improved -Pseudomonas Putida growing in Bld Cx.. - 6/13: to have PICC exchange tomorrow (essentially down to her last usable spot and their is significant concern that she would not regain access if the PICC was pulled). D/t this, she will need 3 weeks zosyn. ID will follow up in clinic.     - 6/14: PICC exchange today (this starts 3 weeks zosyn); rpt bld cx are neg to date; hopeful for discharge in next 24 -48 hours.     - 6/15: rpt Bld Cx remain negative today. No fevers. Vitals are ok. She is good to discharge to home and complete 3 weeks zosyn. Follow up with PCP in 1 week. Follow up with ID (office will call with appt).   Extensive Crohn's disease s/p multiple bowel resections and resultant short bowel syndrome now TPN dependent with chronic abdominal pain: - Follows with [facility], Dr. Domenica Fail. - Keep n.p.o. for now except sips with meds - Continue holding TPN for now - Continue pain control with as needed oral tramadol and IV Dilaudid 2 mg q3h only for severe pain with hold parameters - LR at 125cc/hr, follow -6/15: she wanted to try a soft diet today. Will discharge today w/ TPN supplement.   AKI on CKD stage III: - Creatinine 1.88 on admission compared  to 1.26 on 04/18/2020. -6/15: Scr is stable today at 1.7. UOP is ok and she is not acidotic. Can follow up outpt.   Abnormal LFTs: - at admission: AST 637, ALT 604, alk phos 187 with total bilirubin 0.9.  - CT abdomen/pelvis shows stable chronic changes  without acute findings. - Hepatitis panel is negative - RUQ [place] pending - 6/15: this was secondary to TPN use; LFTs are normalizing.   Chronic pancreatic duct dilatation: - Seen again on CT abdomen/pelvis and appears stable.  Hypokalemia - Mg2+ ok, replace K+ today     - 6/15: continue home regimen at discharge.  Hypertension: - Continue amlodipine and Lopressor. - BP is acceptable - 6/13:schedule hydralazine 41m TID  Anemia of chronic disease and iron deficiency: - no evidence of bleed, monitor  Depression: - continue Wellbutrin and Cymbalta.  Chronic pain: - continue tramadol and IV Dilaudid as needed with hold parameters as above.  Discharge Instructions   Allergies as of 05/10/2020      Reactions   Cefepime Other (See Comments)   Neurotoxicity occurring in setting of AKI. Ceftriaxone tolerated during same admit   Gabapentin Other (See Comments)   unknown   Hyoscyamine Hives, Swelling   Legs swelling Disorientation   Lyrica [pregabalin] Other (See Comments)   unknown   Meperidine Hives   Other reaction(s): GI Upset Due to Chrones   Topamax [topiramate] Other (See Comments)   unknown   Fentanyl Rash   Pt is allergic to fentanyl patch related to the glue (gives her a rash) Pt states she is NOT allergic to fentanyl IV medicine   Morphine And Related Rash      Medication List    TAKE these medications   acetaminophen 325 MG tablet Commonly known as: TYLENOL Take 650 mg by mouth every 6 (six) hours as needed for mild pain.   amLODipine 10 MG tablet Commonly known as: NORVASC Take 1 tablet (10 mg total) by mouth daily. Start taking on: May 11, 2020 What changed:   medication strength  how much to take   budesonide 3 MG 24 hr capsule Commonly known as: ENTOCORT EC TAKE 3 CAPSULES BY MOUTH ONCE DAILY What changed: how much to take   cholecalciferol 25 MCG (1000 UNIT) tablet Commonly known as:  VITAMIN D3 Take 1,000 Units by mouth daily.   Creon 36000 UNITS Cpep capsule Generic drug: lipase/protease/amylase Take 36,000 Units by mouth 3 (three) times daily as needed (with meals for digestion).   cycloSPORINE 0.05 % ophthalmic emulsion Commonly known as: RESTASIS Place 1 drop into both eyes 2 (two) times daily.   Dexilant 60 MG capsule Generic drug: dexlansoprazole Take 1 capsule (60 mg total) by mouth daily.   Dextran 70-Hypromellose 0.1-0.3 % Soln Place 1 drop into both eyes 4 (four) times daily.   dicyclomine 20 MG tablet Commonly known as: BENTYL Take 20 mg by mouth 3 (three) times daily.   diphenoxylate-atropine 2.5-0.025 MG tablet Commonly known as: LOMOTIL Take 1 tablet by mouth 4 (four) times daily as needed for diarrhea or loose stools.   DULoxetine 30 MG capsule Commonly known as: CYMBALTA Take 3 capsules (90 mg total) by mouth daily. What changed: additional instructions   DULoxetine 60 MG capsule Commonly known as: CYMBALTA Take 60 mg by mouth daily. Takes along with 30 mg tablet (Total 90 mg) What changed: Another medication with the same name was changed. Make sure you understand how and when to take each.   estradiol 2 MG  tablet Commonly known as: ESTRACE Take 1 tablet (2 mg total) by mouth daily.   famotidine 20 MG tablet Commonly known as: PEPCID Take 20 mg by mouth at bedtime.   hydrALAZINE 50 MG tablet Commonly known as: APRESOLINE Take 1 tablet (50 mg total) by mouth 3 (three) times daily.   HYDROmorphone 4 MG tablet Commonly known as: Dilaudid Take 1 tablet (4 mg total) by mouth every 6 (six) hours as needed for severe pain.   loperamide 2 MG capsule Commonly known as: IMODIUM TAKE 1 CAPSULE BY MOUTH   AS NEEDED FOR DIARRHEA OR  LOOSE  STOOLS What changed: See the new instructions.   methadone 5 MG tablet Commonly known as: DOLOPHINE Take 5 mg by mouth 5 (five) times daily. What changed: Another medication with the same name was  removed. Continue taking this medication, and follow the directions you see here.   metoprolol tartrate 100 MG tablet Commonly known as: LOPRESSOR Take 1 tablet (100 mg total) by mouth 2 (two) times daily.   MULTIVITAMIN ADULT PO Take 1 tablet by mouth daily.   Omega-3 1000 MG Caps Take 1,000 mg by mouth daily.   piperacillin-tazobactam  IVPB Commonly known as: ZOSYN Inject 13.5 g into the vein continuous for 20 days. Infuse piperacillin/tazobactam 13.5gm daily as continuous infusion over 24h Indication: Pseudomonas Putida bacteremia First Dose: Yes Last Day of Therapy:  05/30/2020 Labs - Once weekly:  CBC/D and BMP, Labs - Every other week:  ESR and CRP Method of administration: Elastomeric (Continuous infusion) Method of administration may be changed at the discretion of home infusion pharmacist based upon assessment of the patient and/or caregiver's ability to self-administer the medication ordered.   potassium chloride SA 20 MEQ tablet Commonly known as: KLOR-CON Take 40 mEq by mouth daily.   PRESCRIPTION MEDICATION Inject 1 each into the vein daily. Home TPN . [employer] in [facility] . 1 bag for 16 hours.   PROBIOTIC-10 PO Take 2 capsules by mouth daily.   promethazine 25 MG tablet Commonly known as: PHENERGAN TAKE 1 TABLET BY MOUTH EVERY 6 HOURS AS NEEDED FOR NAUSEA AND VOMITING What changed: See the new instructions.   sucralfate 1 GM/10ML suspension Commonly known as: Carafate Take 10 mLs (1 g total) by mouth 4 (four) times daily -  with meals and at bedtime. What changed: when to take this   traZODone 100 MG tablet Commonly known as: DESYREL Take 2 tablets (200 mg total) by mouth at bedtime as needed for sleep.   vitamin B-12 100 MCG tablet Commonly known as: CYANOCOBALAMIN Take 1,000 mcg by mouth daily.   Wellbutrin SR 100 MG 12 hr tablet Generic drug: buPROPion Take 200 mg by mouth daily.            Discharge Care Instructions  (From  admission, onward)         Start     Ordered   05/10/20 0000  Change dressing on IV access line weekly and PRN  (Home infusion instructions - [facility] )        05/10/20 1204          Allergies  Allergen Reactions  . Cefepime Other (See Comments)    Neurotoxicity occurring in setting of AKI. Ceftriaxone tolerated during same admit  . Gabapentin Other (See Comments)    unknown  . Hyoscyamine Hives and Swelling    Legs swelling   Disorientation  . Lyrica [Pregabalin] Other (See Comments)    unknown  .  Meperidine Hives    Other reaction(s): GI Upset Due to Chrones   . Topamax [Topiramate] Other (See Comments)    unknown  . Fentanyl Rash    Pt is allergic to fentanyl patch related to the glue (gives her a rash) Pt states she is NOT allergic to fentanyl IV medicine  . Morphine And Related Rash    Procedures/Studies: CT Abdomen Pelvis Wo Contrast  Result Date: 05/05/2020 CLINICAL DATA:  Severe abdominal pain.  Fever.  Crohn disease. EXAM: CT ABDOMEN AND PELVIS WITHOUT CONTRAST TECHNIQUE: Multidetector CT imaging of the abdomen and pelvis was performed following the standard protocol without IV contrast. COMPARISON:  04/13/2020 FINDINGS: Lower chest: No acute findings. Hepatobiliary: No mass visualized on this unenhanced exam. Prior cholecystectomy. Pneumobilia again seen, without evidence of biliary obstruction. Pancreas: No mass or inflammatory process visualized on this unenhanced exam. Diffuse pancreatic ductal dilatation is demonstrated. Spleen:  Within normal limits in size. Adrenals/Urinary tract: No evidence of urolithiasis or hydronephrosis. Unremarkable unopacified urinary bladder. Stomach/Bowel: Stable postop changes from previous bowel resection. No evidence of obstruction, inflammatory process, or abnormal fluid collections. Vascular/Lymphatic: No pathologically enlarged lymph nodes identified. No evidence of abdominal aortic aneurysm. Left femoral vein  catheter remains in place with tip in the IVC. Reproductive: Prior hysterectomy noted. Adnexal regions are unremarkable in appearance. Other:  None. Musculoskeletal:  No suspicious bone lesions identified. IMPRESSION: Stable exam. No evidence of active Crohn disease or other acute findings. Electronically Signed   By: Marlaine Hind M.D.   On: 05/05/2020 17:49   CT ABDOMEN PELVIS WO CONTRAST  Result Date: 04/13/2020 CLINICAL DATA:  Nausea and vomiting. Patient has history of cholecystectomy, appendectomy, and previous colon surgery for Crohn's disease. EXAM: CT ABDOMEN AND PELVIS WITHOUT CONTRAST TECHNIQUE: Multidetector CT imaging of the abdomen and pelvis was performed following the standard protocol without IV contrast. COMPARISON:  CT 03/05/2020, MRCP 03/07/2020 FINDINGS: Lower chest: The lung bases are clear. Hepatobiliary: Minimal pneumobilia in the left lobe likely related to prior ERCP. No focal hepatic lesion. Prior cholecystectomy. No biliary dilatation on noncontrast exam. Pancreas: Proximal pancreatic ductal dilatation of 5 mm which appears similar to prior exam. There is suggestion of retained contrast in the pancreatic duct from prior ERCP. No peripancreatic fat stranding. No peripancreatic fluid collection. Spleen: Normal in size without focal abnormality. Adrenals/Urinary Tract: No adrenal nodule. No hydronephrosis or perinephric edema. No renal stone. Urinary bladder is decompressed. Stomach/Bowel: Stomach is unremarkable. Duodenum approaches the midline with small-bowel than courses in the right abdomen, unchanged. There is no evidence of bowel obstruction or inflammation. Prior right hemicolectomy. Additional chain sutures within small bowel. Enteric contrast reaches the ileocolic junction. Mild gaseous distension of transverse, descending, and to a lesser extent sigmoid colon is also seen on prior exam. Scattered liquid stool in the colon. No colonic wall thickening or inflammation.  Vascular/Lymphatic: Left common femoral venous catheter tip is in the IVC. Mild aortic atherosclerosis. No bulky adenopathy, evaluation for enlarged lymph nodes is limited in the absence of IV contrast and paucity of intra-abdominal fat. Reproductive: Status post hysterectomy. No adnexal masses. Other: No ascites, free air, or significant inflammatory change. No body wall hernia. Musculoskeletal: There are no acute or suspicious osseous abnormalities. Chronic avascular necrosis of the left femoral head. Chronic bone island in the left pelvis. IMPRESSION: 1. No acute findings in the abdomen/pelvis. 2. Unchanged pancreatic ductal dilatation without evidence of pancreatitis/pancreatic inflammation. 3. No acute bowel inflammation. Mild chronic colonic distension, unchanged from prior exam. No  acute inflammation. Aortic Atherosclerosis (ICD10-I70.0). Electronically Signed   By: Keith Rake M.D.   On: 04/13/2020 21:37   DG Chest 2 View  Result Date: 05/05/2020 CLINICAL DATA:  Fever. EXAM: CHEST - 2 VIEW COMPARISON:  March 05, 2020 FINDINGS: There is no evidence of acute infiltrate, pleural effusion or pneumothorax. Surgical clips are seen along the right perihilar region and right lung base. The heart size and mediastinal contours are within normal limits. The visualized skeletal structures are unremarkable. IMPRESSION: No active cardiopulmonary disease. Electronically Signed   By: Virgina Norfolk M.D.   On: 05/05/2020 21:43   IR Fluoro Guide CV Line Left  Result Date: 05/09/2020 INDICATION: 59 year old female with Crohn's disease and short gut syndrome chronically dependent upon TPN. She has had numerous long-term central venous access is dating back to 2003. She has reportedly occluded in her arms and neck region bilaterally. She currently has a chronic indwelling left femoral tunneled PICC which gets exchanged whenever she develops bacteremia. She is currently admitted with bacteremia and presents for  line exchange. EXAM: IR LEFT FLUORO GUIDE CV LINE MEDICATIONS: Patient is currently an inpatient and receiving antibiotics. No additional antibiotic prophylaxis was administered. ANESTHESIA/SEDATION: Moderate (conscious) sedation was employed during this procedure. A total of Versed 2 mg and Fentanyl 100 mcg was administered intravenously. Moderate Sedation Time: 10 minutes. [name] level of consciousness and vital signs were monitored continuously by radiology nursing throughout the procedure under my direct supervision. FLUOROSCOPY TIME:  Fluoroscopy Time: 0 minutes 12 seconds (1 mGy). COMPLICATIONS: None immediate. PROCEDURE: Informed written consent was obtained from the patient after a thorough discussion of the procedural risks, benefits and alternatives. All questions were addressed. Maximal Sterile Barrier Technique was utilized including caps, mask, sterile gowns, sterile gloves, sterile drape, hand hygiene and skin antiseptic. A timeout was performed prior to the initiation of the procedure. Local anesthesia was attained by infiltration with 1% lidocaine. The existing retention suture was cut and removed. The fabric cuff was freed from the surrounding soft tissues and pulled out of the skin. The PICC was then cut and removed over a wire. The tip was severed and reserved for culture. A new dual lumen PICC was cut to 40 cm. The peel-away sheath was advanced over the wire and into the common femoral vein. The wire was exchanged for a platinum plus wire. The PICC was then advanced over the wire and position with the tip in the inferior vena cava. The catheter flushed and aspirated easily. The catheter was flushed and capped before being secured to the skin with 0 Prolene suture. Sterile bandages were placed. IMPRESSION: Successful exchange for a new tunneled 40 cm dual lumen power PICC. The catheter tip is in the inferior vena cava and ready for immediate use. Electronically Signed   By: Jacqulynn Cadet M.D.   On: 05/09/2020 10:09   US Abdomen Limited RUQ  Result Date: 05/06/2020 CLINICAL DATA:  Elevated LFTs. EXAM: ULTRASOUND ABDOMEN LIMITED RIGHT UPPER QUADRANT COMPARISON:  CT yesterday. FINDINGS: Gallbladder: Surgically absent. Common bile duct: Diameter: 5 mm. Liver: Pneumobilia on prior CT is not well demonstrated by ultrasound. Within normal limits in parenchymal echogenicity. Portal vein is patent on color Doppler imaging with normal direction of blood flow towards the liver. Other: Trace free fluid adjacent to the liver, no additional ascites. Increased echogenicity of the right kidney suggestive of medical renal disease. IMPRESSION: 1. No explanation for LFTs.  Normal hepatic echogenicity. 2. Pneumobilia on recent CT not well  demonstrated sonographically. 3. Post cholecystectomy without biliary dilatation. Electronically Signed   By: Keith Rake M.D.   On: 05/06/2020 18:30     Subjective: "That would be great."  Discharge Exam: Vitals:   05/09/20 1957 05/10/20 0712  BP: (!) 147/69 (!) 155/67  Pulse: (!) 56 60  Resp: 18 18  Temp: 98.6 F (37 C) 98.7 F (37.1 C)  SpO2: 98% 98%   Vitals:   05/09/20 1012 05/09/20 1639 05/09/20 1957 05/10/20 0712  BP: (!) 166/71 132/72 (!) 147/69 (!) 155/67  Pulse: (!) 51 (!) 57 (!) 56 60  Resp: 16 18 18 18   Temp: 98.4 F (36.9 C) 98.7 F (37.1 C) 98.6 F (37 C) 98.7 F (37.1 C)  TempSrc: Oral Oral Oral Oral  SpO2: 97% 98% 98% 98%  Weight:      Height:       General: 59 y.o. female resting in bed in NAD Cardiovascular: RRR, +S1, S2, no m/g/r Respiratory: CTABL, no w/r/r, normal WOB GI: BS+, NDNT, no masses noted, no organomegaly noted MSK: No e/c/c Neuro: A&O x 3, no focal deficits Psyc: Appropriate interaction and affect, calm/cooperative   The results of significant diagnostics from this hospitalization (including imaging, microbiology, ancillary and laboratory) are listed below for reference.      Microbiology: Recent Results (from the past 240 hour(s))  SARS Coronavirus 2 by RT PCR (hospital order, performed in [facility] hospital lab) Nasopharyngeal Nasopharyngeal Swab     Status: None   Collection Time: 05/05/20  3:23 PM   Specimen: Nasopharyngeal Swab  Result Value Ref Range Status   SARS Coronavirus 2 NEGATIVE NEGATIVE Final    Comment: (NOTE) SARS-CoV-2 target nucleic acids are NOT DETECTED.  The SARS-CoV-2 RNA is generally detectable in upper and lower respiratory specimens during the acute phase of infection. The lowest concentration of SARS-CoV-2 viral copies this assay can detect is 250 copies / mL. A negative result does not preclude SARS-CoV-2 infection and should not be used as the sole basis for treatment or other patient management decisions.  A negative result may occur with improper specimen collection / handling, submission of specimen other than nasopharyngeal swab, presence of viral mutation(s) within the areas targeted by this assay, and inadequate number of viral copies (<250 copies / mL). A negative result must be combined with clinical observations, patient history, and epidemiological information.  Fact Sheet for Patients:   StrictlyIdeas.no  Fact Sheet for Healthcare Providers: BankingDealers.co.za  This test is not yet approved or  cleared by the [place] FDA and has been authorized for detection and/or diagnosis of SARS-CoV-2 by FDA under an Emergency Use Authorization (EUA).  This EUA will remain in effect (meaning this test can be used) for the duration of the COVID-19 declaration under Section 564(b)(1) of the Act, 21 U.S.C. section 360bbb-3(b)(1), unless the authorization is terminated or revoked sooner.  Performed at [facility], [place] [address], [place], [place] [address]   Blood culture (routine x 2)     Status: Abnormal   Collection Time: 05/05/20  3:39 PM    Specimen: BLOOD  Result Value Ref Range Status   Specimen Description   Final    BLOOD PICC LINE Performed at [facility] [address], [place], [place] [address]    Special Requests   Final    BOTTLES DRAWN AEROBIC AND ANAEROBIC Blood Culture adequate volume Performed at [facility] [address] [place], [place] [address]  Culture  Setup Time (A)  Final    GRAM VARIABLE ROD IN BOTH AEROBIC AND ANAEROBIC BOTTLES CRITICAL RESULT CALLED TO, READ BACK BY AND VERIFIED WITH: Yale M HICKS [id number] AT 74 AM BY CM Performed at [facility], [place] [address] [place], [place] [address]    Culture PSEUDOMONAS PUTIDA (A)  Final   Report Status 05/08/2020 FINAL  Final   Organism ID, Bacteria PSEUDOMONAS PUTIDA  Final      Susceptibility   Pseudomonas putida - MIC*    CEFTAZIDIME 2 SENSITIVE Sensitive     CIPROFLOXACIN <=0.25 SENSITIVE Sensitive     GENTAMICIN <=1 SENSITIVE Sensitive     IMIPENEM 1 SENSITIVE Sensitive     PIP/TAZO 8 SENSITIVE Sensitive     CEFEPIME <=1 SENSITIVE Sensitive     * PSEUDOMONAS PUTIDA  Blood Culture ID Panel (Reflexed)     Status: None   Collection Time: 05/05/20  3:39 PM  Result Value Ref Range Status   Enterococcus species NOT DETECTED NOT DETECTED Final   Listeria monocytogenes NOT DETECTED NOT DETECTED Final   Staphylococcus species NOT DETECTED NOT DETECTED Final   Staphylococcus aureus (BCID) NOT DETECTED NOT DETECTED Final   Streptococcus species NOT DETECTED NOT DETECTED Final   Streptococcus agalactiae NOT DETECTED NOT DETECTED Final   Streptococcus pneumoniae NOT DETECTED NOT DETECTED Final   Streptococcus pyogenes NOT DETECTED NOT DETECTED Final   Acinetobacter baumannii NOT DETECTED NOT DETECTED Final   Enterobacteriaceae species NOT DETECTED NOT DETECTED Final   Enterobacter cloacae complex NOT DETECTED NOT DETECTED Final   Escherichia coli NOT DETECTED NOT DETECTED Final   Klebsiella oxytoca  NOT DETECTED NOT DETECTED Final   Klebsiella pneumoniae NOT DETECTED NOT DETECTED Final   Proteus species NOT DETECTED NOT DETECTED Final   Serratia marcescens NOT DETECTED NOT DETECTED Final   Haemophilus influenzae NOT DETECTED NOT DETECTED Final   Neisseria meningitidis NOT DETECTED NOT DETECTED Final   Pseudomonas aeruginosa NOT DETECTED NOT DETECTED Final   Candida albicans NOT DETECTED NOT DETECTED Final   Candida glabrata NOT DETECTED NOT DETECTED Final   Candida krusei NOT DETECTED NOT DETECTED Final   Candida parapsilosis NOT DETECTED NOT DETECTED Final   Candida tropicalis NOT DETECTED NOT DETECTED Final    Comment: Performed at [facility], [place] [address], [place], [place] [address]  Blood culture (routine x 2)     Status: Abnormal   Collection Time: 05/05/20  3:55 PM   Specimen: BLOOD  Result Value Ref Range Status   Specimen Description   Final    BLOOD PICC LINE Performed at [facility] [address] [place], [place] [address]    Special Requests   Final    BOTTLES DRAWN AEROBIC AND ANAEROBIC Blood Culture adequate volume Performed at [facility] [address], [place], [place] [address]    Culture  Setup Time (A)  Final    GRAM VARIABLE ROD AEROBIC BOTTLE ONLY CRITICAL VALUE NOTED.  VALUE IS CONSISTENT WITH PREVIOUSLY REPORTED AND CALLED VALUE.    Culture (A)  Final    PSEUDOMONAS PUTIDA SUSCEPTIBILITIES PERFORMED ON PREVIOUS CULTURE WITHIN THE LAST 5 DAYS. Performed at [facility], [place] [address], [place], [place] [address]    Report Status 05/08/2020 FINAL  Final  Gastrointestinal Panel by PCR , Stool     Status: None   Collection Time: 05/06/20  3:58 PM   Specimen: Stool  Result Value Ref Range  Status   Campylobacter species NOT DETECTED NOT DETECTED Final   Plesimonas shigelloides NOT DETECTED NOT DETECTED Final   Salmonella species NOT DETECTED NOT DETECTED Final   Yersinia enterocolitica NOT  DETECTED NOT DETECTED Final   Vibrio species NOT DETECTED NOT DETECTED Final   Vibrio cholerae NOT DETECTED NOT DETECTED Final   Enteroaggregative E coli (EAEC) NOT DETECTED NOT DETECTED Final   Enteropathogenic E coli (EPEC) NOT DETECTED NOT DETECTED Final   Enterotoxigenic E coli (ETEC) NOT DETECTED NOT DETECTED Final   Shiga like toxin producing E coli (STEC) NOT DETECTED NOT DETECTED Final   Shigella/Enteroinvasive E coli (EIEC) NOT DETECTED NOT DETECTED Final   Cryptosporidium NOT DETECTED NOT DETECTED Final   Cyclospora cayetanensis NOT DETECTED NOT DETECTED Final   Entamoeba histolytica NOT DETECTED NOT DETECTED Final   Giardia lamblia NOT DETECTED NOT DETECTED Final   Adenovirus F40/41 NOT DETECTED NOT DETECTED Final   Astrovirus NOT DETECTED NOT DETECTED Final   Norovirus GI/GII NOT DETECTED NOT DETECTED Final   Rotavirus A NOT DETECTED NOT DETECTED Final   Sapovirus (I, II, IV, and V) NOT DETECTED NOT DETECTED Final    Comment: Performed at [facility], [place] [place], [place] [address]  C Difficile Quick Screen w PCR reflex     Status: None   Collection Time: 05/06/20  3:58 PM   Specimen: Stool  Result Value Ref Range Status   C Diff antigen NEGATIVE NEGATIVE Final   C Diff toxin NEGATIVE NEGATIVE Final   C Diff interpretation No C. difficile detected.  Final    Comment: VALID Performed at [facility], [place] [address] [place], [place] [address]   Culture, blood (routine x 2)     Status: None (Preliminary result)   Collection Time: 05/08/20  2:20 PM   Specimen: Right Antecubital; Blood  Result Value Ref Range Status   Specimen Description   Final    RIGHT ANTECUBITAL Performed at [facility] [address], [place], [place] [address]    Special Requests   Final    BOTTLES DRAWN AEROBIC ONLY Blood Culture results may not be optimal due to an excessive volume of blood received in culture bottles Performed at  [facility] [address] [place], [place] [address]    Culture   Final    NO GROWTH 2 DAYS Performed at [facility] [address], [place], [place] [address]    Report Status PENDING  Incomplete  Culture, blood (routine x 2)     Status: None (Preliminary result)   Collection Time: 05/08/20  2:34 PM   Specimen: BLOOD RIGHT HAND  Result Value Ref Range Status   Specimen Description   Final    BLOOD RIGHT HAND Performed at [facility] [address], [place], [place] [address]    Special Requests   Final    BOTTLES DRAWN AEROBIC ONLY Blood Culture adequate volume Performed at [facility] [address], [place], [place] [address]    Culture   Final    NO GROWTH 2 DAYS Performed at [facility] [address], [place], [place] [address]    Report Status PENDING  Incomplete  Cath Tip Culture     Status: None (Preliminary result)   Collection Time: 05/09/20  4:50 PM   Specimen: Catheter Tip; Other  Result Value Ref Range Status   Specimen Description   Final  CATH TIP Performed at [facility], [place] [address], [place], [place] [address]    Special Requests   Final    NONE Performed at [facility], [place] [address], [place], [place] [address]    Culture   Final    NO GROWTH < 24 HOURS Performed at [facility] [address] [place],  [address]    Report Status PENDING  Incomplete     Labs: BNP (last 3 results) Recent Labs    10/12/19 2059 10/28/19 0435 11/26/19 0209  BNP 253.9* 162.4* 704.8*   Basic Metabolic Panel: Recent Labs  Lab 05/05/20 1539 05/05/20 1539 05/06/20 0525 05/07/20 0500 05/08/20 0500 05/09/20 0500 05/10/20 0530  NA 138   < > 137 138 140 140 141  K 3.2*   < > 4.0 4.4 3.6 3.3* 3.3*  CL 102   < > 103 106 104 107 110  CO2 28   < > 25 23 26 24 23   GLUCOSE 85   < > 77 76 99 108* 93  BUN 50*   < > 45* 29* 18 13  11   CREATININE 1.88*   < > 1.64* 1.63* 1.61* 1.72* 1.72*  CALCIUM 8.5*   < > 8.6* 9.2 9.0 8.9 9.2  MG 2.1  --   --  1.8 1.7 1.7 1.7   < > = values in this interval not displayed.   Liver Function Tests: Recent Labs  Lab 05/06/20 0525 05/07/20 0500 05/08/20 0500 05/09/20 0500 05/10/20 0530  AST 442* 171* 88* 49* 37  ALT 640* 441* 333* 235* 182*  ALKPHOS 186* 163* 186* 183* 199*  BILITOT 1.1 1.1 1.3* 0.9 1.1  PROT 6.6 6.6 7.4 6.8 7.0  ALBUMIN 3.2* 3.1* 3.3* 3.0* 3.3*   Recent Labs  Lab 05/05/20 2119  LIPASE 19   No results for input(s): AMMONIA in the last 168 hours. CBC: Recent Labs  Lab 05/05/20 1539 05/05/20 1539 05/06/20 0525 05/07/20 0500 05/08/20 0500 05/09/20 0500 05/10/20 0530  WBC 6.0   < > 10.5 6.4 4.0 2.8* 3.5*  NEUTROABS 5.4  --   --  4.9 2.5 1.4* 1.4*  HGB 8.7*   < > 8.2* 8.3* 9.1* 9.1* 9.5*  HCT 26.6*   < > 25.3* 26.3* 28.3* 28.2* 29.0*  MCV 95.7   < > 97.7 97.8 96.3 95.3 94.8  PLT 123*   < > 102* 107* 121* 128* 132*   < > = values in this interval not displayed.   Cardiac Enzymes: No results for input(s): CKTOTAL, CKMB, CKMBINDEX, TROPONINI in the last 168 hours. BNP: Invalid input(s): POCBNP CBG: No results for input(s): GLUCAP in the last 168 hours. D-Dimer No results for input(s): DDIMER in the last 72 hours. Hgb A1c No results for input(s): HGBA1C in the last 72 hours. Lipid Profile No results for input(s): CHOL, HDL, LDLCALC, TRIG, CHOLHDL, LDLDIRECT in the last 72 hours. Thyroid function studies No results for input(s): TSH, T4TOTAL, T3FREE, THYROIDAB in the last 72 hours.  Invalid input(s): FREET3 Anemia work up No results for input(s): VITAMINB12, FOLATE, FERRITIN, TIBC, IRON, RETICCTPCT in the last 72 hours. Urinalysis    Component Value Date/Time   COLORURINE YELLOW 05/05/2020 1513   APPEARANCEUR CLEAR 05/05/2020 1513   LABSPEC 1.016 05/05/2020 1513   PHURINE 7.0 05/05/2020 1513   GLUCOSEU NEGATIVE 05/05/2020 1513   HGBUR  NEGATIVE 05/05/2020 [place] 05/05/2020 1513   [place] 05/05/2020 1513  PROTEINUR NEGATIVE 05/05/2020 1513   NITRITE NEGATIVE 05/05/2020 1513   LEUKOCYTESUR NEGATIVE 05/05/2020 1513   Sepsis Labs Invalid input(s): PROCALCITONIN,  WBC,  LACTICIDVEN Microbiology Recent Results (from the past 240 hour(s))  SARS Coronavirus 2 by RT PCR (hospital order, performed in [facility] hospital lab) Nasopharyngeal Nasopharyngeal Swab     Status: None   Collection Time: 05/05/20  3:23 PM   Specimen: Nasopharyngeal Swab  Result Value Ref Range Status   SARS Coronavirus 2 NEGATIVE NEGATIVE Final    Comment: (NOTE) SARS-CoV-2 target nucleic acids are NOT DETECTED.  The SARS-CoV-2 RNA is generally detectable in upper and lower respiratory specimens during the acute phase of infection. The lowest concentration of SARS-CoV-2 viral copies this assay can detect is 250 copies / mL. A negative result does not preclude SARS-CoV-2 infection and should not be used as the sole basis for treatment or other patient management decisions.  A negative result may occur with improper specimen collection / handling, submission of specimen other than nasopharyngeal swab, presence of viral mutation(s) within the areas targeted by this assay, and inadequate number of viral copies (<250 copies / mL). A negative result must be combined with clinical observations, patient history, and epidemiological information.  Fact Sheet for Patients:   StrictlyIdeas.no  Fact Sheet for Healthcare Providers: BankingDealers.co.za  This test is not yet approved or  cleared by the [place] FDA and has been authorized for detection and/or diagnosis of SARS-CoV-2 by FDA under an Emergency Use Authorization (EUA).  This EUA will remain in effect (meaning this test can be used) for the duration of the COVID-19 declaration under Section 564(b)(1) of the Act,  21 U.S.C. section 360bbb-3(b)(1), unless the authorization is terminated or revoked sooner.  Performed at [facility], [place] [address] [place], [place] [address]   Blood culture (routine x 2)     Status: Abnormal   Collection Time: 05/05/20  3:39 PM   Specimen: BLOOD  Result Value Ref Range Status   Specimen Description   Final    BLOOD PICC LINE Performed at [facility] [address], [place], [place] [address]    Special Requests   Final    BOTTLES DRAWN AEROBIC AND ANAEROBIC Blood Culture adequate volume Performed at [facility] [address], [place], [place] [address]    Culture  Setup Time (A)  Final    GRAM VARIABLE ROD IN BOTH AEROBIC AND ANAEROBIC BOTTLES CRITICAL RESULT CALLED TO, READ BACK BY AND VERIFIED WITH: PHARMD M HICKS [id number] AT 920 AM BY CM Performed at [facility], [place] [address], [place], [place] [address]    Culture PSEUDOMONAS PUTIDA (A)  Final   Report Status 05/08/2020 FINAL  Final   Organism ID, Bacteria PSEUDOMONAS PUTIDA  Final      Susceptibility   Pseudomonas putida - MIC*    CEFTAZIDIME 2 SENSITIVE Sensitive     CIPROFLOXACIN <=0.25 SENSITIVE Sensitive     GENTAMICIN <=1 SENSITIVE Sensitive     IMIPENEM 1 SENSITIVE Sensitive     PIP/TAZO 8 SENSITIVE Sensitive     CEFEPIME <=1 SENSITIVE Sensitive     * PSEUDOMONAS PUTIDA  Blood Culture ID Panel (Reflexed)     Status: None   Collection Time: 05/05/20  3:39 PM  Result Value Ref Range Status   Enterococcus species NOT DETECTED NOT DETECTED Final   Listeria monocytogenes NOT DETECTED NOT DETECTED Final   Staphylococcus species NOT DETECTED NOT DETECTED  Final   Staphylococcus aureus (BCID) NOT DETECTED NOT DETECTED Final   Streptococcus species NOT DETECTED NOT DETECTED Final   Streptococcus agalactiae NOT DETECTED NOT DETECTED Final   Streptococcus pneumoniae NOT DETECTED NOT DETECTED Final   Streptococcus pyogenes NOT  DETECTED NOT DETECTED Final   Acinetobacter baumannii NOT DETECTED NOT DETECTED Final   Enterobacteriaceae species NOT DETECTED NOT DETECTED Final   Enterobacter cloacae complex NOT DETECTED NOT DETECTED Final   Escherichia coli NOT DETECTED NOT DETECTED Final   Klebsiella oxytoca NOT DETECTED NOT DETECTED Final   Klebsiella pneumoniae NOT DETECTED NOT DETECTED Final   Proteus species NOT DETECTED NOT DETECTED Final   Serratia marcescens NOT DETECTED NOT DETECTED Final   Haemophilus influenzae NOT DETECTED NOT DETECTED Final   Neisseria meningitidis NOT DETECTED NOT DETECTED Final   Pseudomonas aeruginosa NOT DETECTED NOT DETECTED Final   Candida albicans NOT DETECTED NOT DETECTED Final   Candida glabrata NOT DETECTED NOT DETECTED Final   Candida krusei NOT DETECTED NOT DETECTED Final   Candida parapsilosis NOT DETECTED NOT DETECTED Final   Candida tropicalis NOT DETECTED NOT DETECTED Final    Comment: Performed at [facility], [place] [address] [place], [place] [address]  Blood culture (routine x 2)     Status: Abnormal   Collection Time: 05/05/20  3:55 PM   Specimen: BLOOD  Result Value Ref Range Status   Specimen Description   Final    BLOOD PICC LINE Performed at [facility] [address], [place], [place] [address]    Special Requests   Final    BOTTLES DRAWN AEROBIC AND ANAEROBIC Blood Culture adequate volume Performed at [facility] [address] [place], [place] [address]    Culture  Setup Time (A)  Final    GRAM VARIABLE ROD AEROBIC BOTTLE ONLY CRITICAL VALUE NOTED.  VALUE IS CONSISTENT WITH PREVIOUSLY REPORTED AND CALLED VALUE.    Culture (A)  Final    PSEUDOMONAS PUTIDA SUSCEPTIBILITIES PERFORMED ON PREVIOUS CULTURE WITHIN THE LAST 5 DAYS. Performed at [facility], [place] [address], [place], [place] [address]    Report Status 05/08/2020 FINAL  Final  Gastrointestinal Panel by PCR , Stool     Status: None    Collection Time: 05/06/20  3:58 PM   Specimen: Stool  Result Value Ref Range Status   Campylobacter species NOT DETECTED NOT DETECTED Final   Plesimonas shigelloides NOT DETECTED NOT DETECTED Final   Salmonella species NOT DETECTED NOT DETECTED Final   Yersinia enterocolitica NOT DETECTED NOT DETECTED Final   Vibrio species NOT DETECTED NOT DETECTED Final   Vibrio cholerae NOT DETECTED NOT DETECTED Final   Enteroaggregative E coli (EAEC) NOT DETECTED NOT DETECTED Final   Enteropathogenic E coli (EPEC) NOT DETECTED NOT DETECTED Final   Enterotoxigenic E coli (ETEC) NOT DETECTED NOT DETECTED Final   Shiga like toxin producing E coli (STEC) NOT DETECTED NOT DETECTED Final   Shigella/Enteroinvasive E coli (EIEC) NOT DETECTED NOT DETECTED Final   Cryptosporidium NOT DETECTED NOT DETECTED Final   Cyclospora cayetanensis NOT DETECTED NOT DETECTED Final   Entamoeba histolytica NOT DETECTED NOT DETECTED Final   Giardia lamblia NOT DETECTED NOT DETECTED Final   Adenovirus F40/41 NOT DETECTED NOT DETECTED Final   Astrovirus NOT DETECTED NOT DETECTED Final   Norovirus GI/GII NOT DETECTED NOT DETECTED Final   Rotavirus A NOT DETECTED NOT DETECTED Final   Sapovirus (I, II, IV, and V) NOT DETECTED NOT  DETECTED Final    Comment: Performed at [facility], [place], [place] [address]  C Difficile Quick Screen w PCR reflex     Status: None   Collection Time: 05/06/20  3:58 PM   Specimen: Stool  Result Value Ref Range Status   C Diff antigen NEGATIVE NEGATIVE Final   C Diff toxin NEGATIVE NEGATIVE Final   C Diff interpretation No C. difficile detected.  Final    Comment: VALID Performed at [facility], [place] [address] [place], [place] [address]   Culture, blood (routine x 2)     Status: None (Preliminary result)   Collection Time: 05/08/20  2:20 PM   Specimen: Right Antecubital; Blood  Result Value Ref Range Status   Specimen Description   Final     RIGHT ANTECUBITAL Performed at [facility] [address], [place], [place] [address]    Special Requests   Final    BOTTLES DRAWN AEROBIC ONLY Blood Culture results may not be optimal due to an excessive volume of blood received in culture bottles Performed at [facility] [address] [place], [place] [address]    Culture   Final    NO GROWTH 2 DAYS Performed at [facility] [address], [place], [place] [address]    Report Status PENDING  Incomplete  Culture, blood (routine x 2)     Status: None (Preliminary result)   Collection Time: 05/08/20  2:34 PM   Specimen: BLOOD RIGHT HAND  Result Value Ref Range Status   Specimen Description   Final    BLOOD RIGHT HAND Performed at [facility] [address] [place], [place] [address]    Special Requests   Final    BOTTLES DRAWN AEROBIC ONLY Blood Culture adequate volume Performed at [facility] [address] [place], [place] [address]    Culture   Final    NO GROWTH 2 DAYS Performed at [facility] [address], [place], [place] [address]    Report Status PENDING  Incomplete  Cath Tip Culture     Status: None (Preliminary result)   Collection Time: 05/09/20  4:50 PM   Specimen: Catheter Tip; Other  Result Value Ref Range Status   Specimen Description   Final    CATH TIP Performed at [facility] [address], [place], [place] [address]    Special Requests   Final    NONE Performed at [facility], [place] [address] [place], [place] [address]    Culture   Final    NO GROWTH < 24 HOURS Performed at [facility] [address], [place],  [address]    Report Status PENDING  Incomplete     Time coordinating discharge: 35 minutes  SIGNED:   Jonnie Finner, DO  [facility] 05/10/2020, 11:56 AM   If 7PM-7AM, please contact night-coverage [email or web address]

## 2020-05-11 ENCOUNTER — Other Ambulatory Visit: Payer: Self-pay | Admitting: *Deleted

## 2020-05-11 ENCOUNTER — Telehealth: Payer: Self-pay | Admitting: *Deleted

## 2020-05-11 MED ORDER — PANCRELIPASE (LIP-PROT-AMYL) 36000-114000 UNITS PO CPEP: 36000.0000 [IU] | ORAL_CAPSULE | Freq: Three times a day (TID) | ORAL | 0 refills | Status: DC | PRN

## 2020-05-11 NOTE — Telephone Encounter (Signed)
Attempted to contact patient. No answer. LVM for patient to return call.

## 2020-05-11 NOTE — Telephone Encounter (Signed)
Historical medication. Okay to refill?

## 2020-05-11 NOTE — Telephone Encounter (Signed)
Ok to refill 

## 2020-05-11 NOTE — Telephone Encounter (Signed)
Transition Care Management Follow-up Telephone Call   Date discharged? 05/10/2020   How have you been since you were released from the hospital? " My normal Im okay just tired"    Do you understand why you were in the hospital? yes   Do you understand the discharge instructions? yes   Where were you discharged to? Home    Items Reviewed:  Medications reviewed: yes  Allergies reviewed: yes  Dietary changes reviewed: yes  Referrals reviewed: N/A    Functional Questionnaire:   Activities of Daily Living (ADLs):   She states they are independent in the following: ambulation, bathing and hygiene, continence, grooming, toileting and dressing States they require assistance with the following: feeding   Any transportation issues/concerns?: no   Any patient concerns? no   Confirmed importance and date/time of follow-up visits scheduled yes  Provider Appointment booked with Dr. Jerilee Hoh 05/24/2020 at 3:30PM  Confirmed with patient if condition begins to worsen call PCP or go to the ER.  Patient was given the office number and encouraged to call back with question or concerns.  : yes

## 2020-05-11 NOTE — Telephone Encounter (Signed)
[facility] RN was speaking with patient for hospital follow up. Patient reports she needs a refill on Creon medication. Clinic RN seen this was rx'd by a historical MD. Patietn reports she needs this for her stomach with her TPN feeding.

## 2020-05-13 LAB — CULTURE, BLOOD (ROUTINE X 2)
Culture: NO GROWTH
Culture: NO GROWTH
Special Requests: ADEQUATE

## 2020-05-13 LAB — CATH TIP CULTURE: Culture: NO GROWTH

## 2020-05-16 ENCOUNTER — Encounter

## 2020-05-16 ENCOUNTER — Telehealth: Payer: Self-pay | Admitting: *Deleted

## 2020-05-16 ENCOUNTER — Ambulatory Visit (INDEPENDENT_AMBULATORY_CARE_PROVIDER_SITE_OTHER): Payer: Medicare Other | Admitting: Infectious Disease

## 2020-05-16 ENCOUNTER — Other Ambulatory Visit: Payer: Self-pay

## 2020-05-16 ENCOUNTER — Other Ambulatory Visit: Payer: Self-pay | Admitting: Internal Medicine

## 2020-05-16 VITALS — Wt 146.2 lb

## 2020-05-16 DIAGNOSIS — T80219D Unspecified infection due to central venous catheter, subsequent encounter: Secondary | ICD-10-CM | POA: Diagnosis not present

## 2020-05-16 DIAGNOSIS — K50819 Crohn's disease of both small and large intestine with unspecified complications: Secondary | ICD-10-CM

## 2020-05-16 DIAGNOSIS — K912 Postsurgical malabsorption, not elsewhere classified: Secondary | ICD-10-CM

## 2020-05-16 NOTE — Telephone Encounter (Signed)
Need to confirm end date for IV zosyn per PICC.  [facility] has 7/5 as stop date per [facility]. Is this correct as is or does it need to be extended? Please advise. Landis Gandy, RN

## 2020-05-16 NOTE — Progress Notes (Signed)
Subjective:  Chief complaint: followup for another line infection   Patient ID: Caitlin Peters, female    DOB: 02-03-1961, 59 y.o.   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  She currently has a femoral IV access PICC line that was exchanged during her most recent hospitalization when we saw her in February.  She was admitted in February with Enterobacter cloacae bacteremia she was treated with Zosyn then changed to ertapenem for ease of administration at home.  Bacteremia cleared and radiology were able to exchange a new line over her old IV access site.  Since I last saw her she unfortunately developed a stenotrophomonas bacteremia associated with her line.  There went PICC line exchange on 14 April.    She is initially treated with Bactrim which was complicated by acute on chronic elevation in her serum creatinine.  We changed her over to IV levofloxacin that she completed.  When I last saw her she was having excruciating abdominal pain and I had her directly admitted to the hospital. Since then she has developed YET another line infection this time with pseudomonas putida and is currently on continuous zosyn infusion.  Past Medical History:  Diagnosis Date  . Acute pancreatitis 04/13/2020  . Anasarca 10/2019  . AVN (avascular necrosis of bone) ([place])   . Cataract   . Chronic pain syndrome   . CKD (chronic kidney disease), stage III   . Crohn disease ([place])   . Crohn disease ([place])   . Depression   . Diverticulosis   . GERD (gastroesophageal reflux disease)   . HTN (hypertension)   . IDA (iron deficiency anemia)   . Malnutrition ([place])   . Mass in chest   . Osteoporosis   .  Pancreatitis   . Short gut syndrome   . Vitamin B12 deficiency     Past Surgical History:  Procedure Laterality Date  . ABDOMINAL HYSTERECTOMY    . ABDOMINAL SURGERY     colon resection with abdominal stoma, repair of intestinal leak, reversal of abdominal stoma  . BILIARY DILATION  11/26/2019   Procedure: BILIARY DILATION;  Surgeon: Jackquline Denmark, MD;  Location: [facility];  Service: Endoscopy;;  . BILIARY DILATION  03/08/2020   Procedure: BILIARY DILATION;  Surgeon: Irving Copas., MD;  Location: [place];  Service: Gastroenterology;;  . BIOPSY  03/08/2020   Procedure: BIOPSY;  Surgeon: Irving Copas., MD;  Location: [place];  Service: Gastroenterology;;  . CHEST WALL RESECTION     right thoracotomy,resection of chest mass with anterior rib and reconstruction using prosthetic mesh and video arthroscopy  . CHOLECYSTECTOMY    . COLONOSCOPY  2019  . ERCP N/A 11/26/2019   Procedure: ENDOSCOPIC RETROGRADE CHOLANGIOPANCREATOGRAPHY (ERCP);  Surgeon: Jackquline Denmark, MD;  Location: [facility];  Service: Endoscopy;  Laterality: N/A;  . ERCP N/A 03/08/2020   Procedure: ENDOSCOPIC RETROGRADE CHOLANGIOPANCREATOGRAPHY (ERCP);  Surgeon: Irving Copas., MD;  Location: [place];  Service: Gastroenterology;  Laterality: N/A;  . ESOPHAGOGASTRODUODENOSCOPY N/A 03/08/2020   Procedure: ESOPHAGOGASTRODUODENOSCOPY (EGD);  Surgeon: Irving Copas., MD;  Location: [place];  Service: Gastroenterology;  Laterality: N/A;  . EUS N/A 03/08/2020   Procedure: UPPER ENDOSCOPIC ULTRASOUND (EUS)  LINEAR;  Surgeon: Rush Landmark Telford Nab., MD;  Location: [place];  Service: Gastroenterology;  Laterality: N/A;  . IR FLUORO GUIDE CV LINE LEFT  01/07/2020  . IR FLUORO GUIDE CV LINE LEFT  03/09/2020  . IR FLUORO GUIDE CV LINE LEFT  05/09/2020  . IR PTA VENOUS EXCEPT DIALYSIS CIRCUIT  01/07/2020  . IR US GUIDE VASC ACCESS LEFT     x 2 06/17/19 and 09/14/2019  . KNEE SURGERY      right knee   . REMOVAL OF STONES  11/26/2019   Procedure: REMOVAL OF STONES;  Surgeon: Jackquline Denmark, MD;  Location: [facility];  Service: Endoscopy;;  . REMOVAL OF STONES  03/08/2020   Procedure: REMOVAL OF STONES;  Surgeon: Irving Copas., MD;  Location: [place];  Service: Gastroenterology;;  . SMALL INTESTINE SURGERY     x3  . SPHINCTEROTOMY  11/26/2019   Procedure: SPHINCTEROTOMY;  Surgeon: Jackquline Denmark, MD;  Location: [facility];  Service: Endoscopy;;  . UPPER GASTROINTESTINAL ENDOSCOPY      Family History  Problem Relation Age of Onset  . Breast cancer Sister   . Multiple sclerosis Sister   . Diabetes Sister   . Lupus Sister   . Colon cancer Other   . Crohn's disease Other   . Seizures Mother   . Glaucoma Mother   . CAD Father   . Heart disease Father   . Hypertension Father       Social History   Socioeconomic History  . Marital status: Single    Spouse name: Not on file  . Number of children: Not on file  . Years of education: Not on file  . Highest education level: Not on file  Occupational History  . Occupation: disabled  Tobacco Use  . Smoking status: Former Research scientist (life sciences)  . Smokeless tobacco: Never Used  Vaping Use  . Vaping Use: Never used  Substance and Sexual Activity  . Alcohol use: Not Currently  . Drug use: Never  . Sexual activity: Not on file  Other Topics Concern  . Not on file  Social History Narrative  . Not on file   Social Determinants of Health   Financial Resource Strain:   . Difficulty of Paying Living Expenses:   Food Insecurity:   . Worried About Charity fundraiser in the Last Year:   . Arboriculturist in the Last Year:   Transportation Needs:   . Film/video editor (Medical):   [name] Kitchen Lack of Transportation (Non-Medical):   Physical Activity:   . Days of Exercise per Week:   . Minutes of Exercise per Session:   Stress:   . Feeling of Stress :   Social Connections:   . Frequency of Communication with  Friends and Family:   . Frequency of Social Gatherings with Friends and Family:   . Attends Religious Services:   . Active Member of Clubs or Organizations:   . Attends Archivist Meetings:   [name] Kitchen Marital Status:     Allergies  Allergen Reactions  . Cefepime Other (See Comments)    Neurotoxicity occurring in setting of AKI. Ceftriaxone tolerated during same admit  . Gabapentin Other (See Comments)    unknown  . Hyoscyamine Hives and Swelling    Legs swelling   Disorientation  . Lyrica [Pregabalin] Other (See Comments)    unknown  . Meperidine Hives    Other reaction(s): GI Upset Due to [place]   . Topamax [Topiramate] Other (See Comments)  unknown  . Fentanyl Rash    Pt is allergic to fentanyl patch related to the glue (gives her a rash) Pt states she is NOT allergic to fentanyl IV medicine  . Morphine And Related Rash     Current Outpatient Medications:  .  acetaminophen (TYLENOL) 325 MG tablet, Take 650 mg by mouth every 6 (six) hours as needed for mild pain. , Disp: , Rfl:  .  amLODipine (NORVASC) 10 MG tablet, Take 1 tablet (10 mg total) by mouth daily., Disp: 30 tablet, Rfl: 0 .  budesonide (ENTOCORT EC) 3 MG 24 hr capsule, TAKE 3 CAPSULES BY MOUTH ONCE DAILY (Patient taking differently: Take 6 mg by mouth daily. ), Disp: 90 capsule, Rfl: 0 .  buPROPion (WELLBUTRIN SR) 100 MG 12 hr tablet, Take 200 mg by mouth daily. , Disp: , Rfl:  .  cholecalciferol (VITAMIN D3) 25 MCG (1000 UT) tablet, Take 1,000 Units by mouth daily. , Disp: , Rfl:  .  cycloSPORINE (RESTASIS) 0.05 % ophthalmic emulsion, Place 1 drop into both eyes 2 (two) times daily., Disp: , Rfl:  .  dexlansoprazole (DEXILANT) 60 MG capsule, Take 1 capsule (60 mg total) by mouth daily., Disp: 90 capsule, Rfl: 1 .  Dextran 70-Hypromellose 0.1-0.3 % SOLN, Place 1 drop into both eyes 4 (four) times daily., Disp: , Rfl:  .  dicyclomine (BENTYL) 20 MG tablet, Take 20 mg by mouth 3 (three) times daily., Disp:  , Rfl:  .  diphenoxylate-atropine (LOMOTIL) 2.5-0.025 MG tablet, Take 1 tablet by mouth 4 (four) times daily as needed for diarrhea or loose stools., Disp: 30 tablet, Rfl: 0 .  DULoxetine (CYMBALTA) 30 MG capsule, Take 3 capsules (90 mg total) by mouth daily. (Patient taking differently: Take 90 mg by mouth daily. Takes along with 60 mg tablet (Total 90 mg)), Disp: , Rfl: 3 .  DULoxetine (CYMBALTA) 60 MG capsule, Take 60 mg by mouth daily. Takes along with 30 mg tablet (Total 90 mg), Disp: , Rfl:  .  estradiol (ESTRACE) 2 MG tablet, Take 1 tablet (2 mg total) by mouth daily., Disp: 90 tablet, Rfl: 1 .  famotidine (PEPCID) 20 MG tablet, Take 20 mg by mouth at bedtime., Disp: , Rfl:  .  hydrALAZINE (APRESOLINE) 50 MG tablet, Take 1 tablet (50 mg total) by mouth 3 (three) times daily., Disp: 90 tablet, Rfl: 0 .  HYDROmorphone (DILAUDID) 4 MG tablet, Take 1 tablet (4 mg total) by mouth every 6 (six) hours as needed for severe pain., Disp: 10 tablet, Rfl: 0 .  lipase/protease/amylase (CREON) 36000 UNITS CPEP capsule, Take 1 capsule (36,000 Units total) by mouth 3 (three) times daily as needed (with meals for digestion)., Disp: 180 capsule, Rfl: 0 .  loperamide (IMODIUM) 2 MG capsule, TAKE 1 CAPSULE BY MOUTH   AS NEEDED FOR DIARRHEA OR  LOOSE  STOOLS (Patient taking differently: Take 2 mg by mouth in the morning, at noon, in the evening, and at bedtime. ), Disp: 90 capsule, Rfl: 0 .  methadone (DOLOPHINE) 5 MG tablet, Take 5 mg by mouth 5 (five) times daily. , Disp: , Rfl:  .  metoprolol tartrate (LOPRESSOR) 100 MG tablet, Take 1 tablet (100 mg total) by mouth 2 (two) times daily., Disp: 90 tablet, Rfl: 1 .  Multiple Vitamins-Minerals (MULTIVITAMIN ADULT PO), Take 1 tablet by mouth daily., Disp: , Rfl:  .  Omega-3 1000 MG CAPS, Take 1,000 mg by mouth daily. , Disp: , Rfl:  .  piperacillin-tazobactam (ZOSYN) IVPB,  Inject 13.5 g into the vein continuous for 20 days. Infuse piperacillin/tazobactam 13.5gm daily  as continuous infusion over 24h Indication: Pseudomonas Putida bacteremia First Dose: Yes Last Day of Therapy:  05/30/2020 Labs - Once weekly:  CBC/D and BMP, Labs - Every other week:  ESR and CRP Method of administration: Elastomeric (Continuous infusion) Method of administration may be changed at the discretion of home infusion pharmacist based upon assessment of the patient and/or caregiver's ability to self-administer the medication ordered., Disp: 20 Units, Rfl: 0 .  potassium chloride SA (KLOR-CON) 20 MEQ tablet, Take 40 mEq by mouth daily., Disp: , Rfl:  .  PRESCRIPTION MEDICATION, Inject 1 each into the vein daily. Home TPN . [employer] in [facility] . 1 bag for 16 hours., Disp: , Rfl:  .  Probiotic Product (PROBIOTIC-10 PO), Take 2 capsules by mouth daily. , Disp: , Rfl:  .  promethazine (PHENERGAN) 25 MG tablet, TAKE 1 TABLET BY MOUTH EVERY 6 HOURS AS NEEDED FOR NAUSEA AND VOMITING (Patient taking differently: Take 25 mg by mouth every 6 (six) hours. ), Disp: 90 tablet, Rfl: 0 .  sucralfate (CARAFATE) 1 GM/10ML suspension, Take 10 mLs (1 g total) by mouth 4 (four) times daily -  with meals and at bedtime. (Patient taking differently: Take 1 g by mouth 4 (four) times daily. ), Disp: 420 mL, Rfl: 2 .  traZODone (DESYREL) 100 MG tablet, Take 2 tablets (200 mg total) by mouth at bedtime as needed for sleep., Disp: 60 tablet, Rfl: 0 .  vitamin B-12 (CYANOCOBALAMIN) 100 MCG tablet, Take 1,000 mcg by mouth daily. , Disp: , Rfl:    Review of Systems  Constitutional: Negative for activity change, appetite change, chills, diaphoresis, fatigue, fever and unexpected weight change.  HENT: Negative for congestion, rhinorrhea, sinus pressure, sneezing, sore throat and trouble swallowing.   Eyes: Negative for photophobia and visual disturbance.  Respiratory: Negative for cough, chest tightness, shortness of breath, wheezing and stridor.   Cardiovascular: Negative for chest pain, palpitations and leg  swelling.  Gastrointestinal: Positive for anal bleeding. Negative for blood in stool, constipation, diarrhea, nausea and vomiting.  Genitourinary: Negative for difficulty urinating, dysuria, flank pain and hematuria.  Musculoskeletal: Negative for arthralgias, back pain, gait problem, joint swelling and myalgias.  Skin: Negative for color change, pallor, rash and wound.  Neurological: Negative for dizziness, tremors, weakness and light-headedness.  Hematological: Negative for adenopathy. Does not bruise/bleed easily.  Psychiatric/Behavioral: Negative for agitation, behavioral problems, confusion, decreased concentration, dysphoric mood and sleep disturbance.       Objective:   Physical Exam Constitutional:      General: She is not in acute distress.    Appearance: She is well-developed. She is not diaphoretic.  HENT:     Head: Normocephalic and atraumatic.     Mouth/Throat:     Pharynx: No oropharyngeal exudate.  Eyes:     General: No scleral icterus.    Conjunctiva/sclera: Conjunctivae normal.  Cardiovascular:     Rate and Rhythm: Normal rate and regular rhythm.  Pulmonary:     Effort: Pulmonary effort is normal. No respiratory distress.     Breath sounds: No wheezing.  Abdominal:     General: There is distension.     Tenderness: There is generalized abdominal tenderness and tenderness in the epigastric area and left upper quadrant. There is rebound.  Musculoskeletal:        General: No tenderness.     Cervical back: Normal range of motion and neck  supple.  Skin:    General: Skin is warm and dry.     Coloration: Skin is not pale.     Findings: No erythema or rash.  Neurological:     Mental Status: She is alert and oriented to person, place, and time.     Motor: No abnormal muscle tone.     Coordination: Coordination normal.  Psychiatric:        Attention and Perception: Attention and perception normal.        Mood and Affect: Affect normal. Mood is anxious.         Speech: Speech normal.        Behavior: Behavior normal.        Thought Content: Thought content normal.        Cognition and Memory: Cognition and memory normal.        Judgment: Judgment normal.    PICC line entry site is clean dry and intact Apr 13, 2020     May 16, 2020:          Assessment & Plan:    History of recurrent PICC line infections: Her current PICC line is clean dry and intact and is not overtly infected and she is getting IV zosyn at present  She will followup with me in August

## 2020-05-16 NOTE — Telephone Encounter (Signed)
Per most recent note [place], not seen for > 1 yr.  Refill not appropriate at this time.    Electronically signed by Vita Barley, APRN - CNP on 05/16/2020 at 3:28 PM

## 2020-05-16 NOTE — Telephone Encounter (Signed)
Cymbalta 30 mg & 60 mg both last sent 01/07/19 with 1 refill

## 2020-05-17 NOTE — Telephone Encounter (Signed)
05/30/2020 is fine

## 2020-05-23 ENCOUNTER — Other Ambulatory Visit: Payer: Self-pay | Admitting: Internal Medicine

## 2020-05-24 ENCOUNTER — Encounter: Payer: Self-pay | Admitting: Internal Medicine

## 2020-05-24 ENCOUNTER — Other Ambulatory Visit: Payer: Self-pay

## 2020-05-24 ENCOUNTER — Ambulatory Visit (INDEPENDENT_AMBULATORY_CARE_PROVIDER_SITE_OTHER): Payer: Medicare Other | Admitting: Internal Medicine

## 2020-05-24 VITALS — BP 110/70 | HR 95 | Temp 97.7°F | Ht 68.0 in | Wt 145.1 lb

## 2020-05-24 DIAGNOSIS — R200 Anesthesia of skin: Secondary | ICD-10-CM

## 2020-05-24 DIAGNOSIS — R112 Nausea with vomiting, unspecified: Secondary | ICD-10-CM

## 2020-05-24 DIAGNOSIS — N1832 Chronic kidney disease, stage 3b: Secondary | ICD-10-CM

## 2020-05-24 DIAGNOSIS — K50819 Crohn's disease of both small and large intestine with unspecified complications: Secondary | ICD-10-CM | POA: Diagnosis not present

## 2020-05-24 DIAGNOSIS — E785 Hyperlipidemia, unspecified: Secondary | ICD-10-CM

## 2020-05-24 DIAGNOSIS — R7881 Bacteremia: Secondary | ICD-10-CM

## 2020-05-24 DIAGNOSIS — K219 Gastro-esophageal reflux disease without esophagitis: Secondary | ICD-10-CM

## 2020-05-24 DIAGNOSIS — Z09 Encounter for follow-up examination after completed treatment for conditions other than malignant neoplasm: Secondary | ICD-10-CM

## 2020-05-24 DIAGNOSIS — R202 Paresthesia of skin: Secondary | ICD-10-CM

## 2020-05-24 DIAGNOSIS — G894 Chronic pain syndrome: Secondary | ICD-10-CM

## 2020-05-24 DIAGNOSIS — I10 Essential (primary) hypertension: Secondary | ICD-10-CM

## 2020-05-24 DIAGNOSIS — E1169 Type 2 diabetes mellitus with other specified complication: Secondary | ICD-10-CM

## 2020-05-24 MED ORDER — DULOXETINE HCL 30 MG PO CPEP: 90.0000 mg | ORAL_CAPSULE | Freq: Every day | ORAL | 1 refills | Status: DC

## 2020-05-24 MED ORDER — DIPHENOXYLATE-ATROPINE 2.5-0.025 MG PO TABS: 1.0000 | ORAL_TABLET | Freq: Four times a day (QID) | ORAL | 0 refills | Status: DC | PRN

## 2020-05-24 NOTE — Patient Instructions (Signed)
-  Nice seeing you today!!  -Lab work today; will notify you once results are available.  -Return for your physical as scheduled for later this year. Please come in fasting that day.

## 2020-05-24 NOTE — Progress Notes (Signed)
Hospital follow-up visit     CC/Reason for Visit: Hospitalization follow-up  HPI: Caitlin Peters is a 59 y.o. female who is coming in today for the above mentioned reasons, specifically transitional care services face-to-face visit.    Dates hospitalized: 05/05/2020-05/10/2020 Days since discharge from hospital: 14 Patient was discharged from the hospital to: Home Reason for admission to hospital: Pseudomonas bacteremia Date of interactive phone contact with patient and/or caregiver: 05/11/2020  I have reviewed in detail patient's discharge summary plus pertinent specific notes, labs, and images from the hospitalization.  She presented to the hospital on 6/10 due to a fever of 102 with chills and body aches.  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  During the hospital she was found to have Pseudomonas bacteremia.  Because this is her last viable IV line, PICC was exchanged on 6/13.  Plan is for her to be on Zosyn for 3 weeks which will finish on July 5.  She has already seen infectious diseases in follow-up.  Since hospital discharge she has been doing relatively well other than feeling excessively fatigued.  She has developed worsening of the tingling of her right greater than left hands.  She states it feels like pinpricks and they are numb frequently.  Medication reconciliation was done today and patient is taking meds as recommended by discharging hospitalist/specialist.  Yes   Past Medical/Surgical History: Past Medical History:  Diagnosis Date  . Acute pancreatitis 04/13/2020  . Anasarca 10/2019  . AVN (avascular necrosis of bone) ([place])   . Cataract   . Chronic pain syndrome   . CKD (chronic kidney disease), stage III   . Crohn disease ([place])   . Crohn disease ([place])   . Depression   .  Diverticulosis   . GERD (gastroesophageal reflux disease)   . HTN (hypertension)   . IDA (iron deficiency anemia)   . Malnutrition ([place])   . Mass in chest   . Osteoporosis   . Pancreatitis   . Short gut syndrome   . Vitamin B12 deficiency     Past Surgical History:  Procedure Laterality Date  . ABDOMINAL HYSTERECTOMY    . ABDOMINAL SURGERY     colon resection with abdominal stoma, repair of intestinal leak, reversal of abdominal stoma  . BILIARY DILATION  11/26/2019   Procedure: BILIARY DILATION;  Surgeon: Jackquline Denmark, MD;  Location: [facility];  Service: Endoscopy;;  . BILIARY DILATION  03/08/2020   Procedure: BILIARY DILATION;  Surgeon: Irving Copas., MD;  Location: [place];  Service: Gastroenterology;;  . BIOPSY  03/08/2020   Procedure: BIOPSY;  Surgeon: Irving Copas., MD;  Location: [place];  Service: Gastroenterology;;  . CHEST WALL RESECTION     right thoracotomy,resection of chest mass with anterior rib and reconstruction using prosthetic mesh and video arthroscopy  . CHOLECYSTECTOMY    . COLONOSCOPY  2019  . ERCP N/A 11/26/2019   Procedure: ENDOSCOPIC RETROGRADE CHOLANGIOPANCREATOGRAPHY (ERCP);  Surgeon: Jackquline Denmark, MD;  Location: [facility];  Service: Endoscopy;  Laterality: N/A;  . ERCP N/A 03/08/2020   Procedure: ENDOSCOPIC RETROGRADE CHOLANGIOPANCREATOGRAPHY (ERCP);  Surgeon: Irving Copas., MD;  Location: [place];  Service: Gastroenterology;  Laterality: N/A;  . ESOPHAGOGASTRODUODENOSCOPY N/A 03/08/2020   Procedure: ESOPHAGOGASTRODUODENOSCOPY (EGD);  Surgeon: Rush Landmark Telford Nab., MD;  Location: [place];  Service:  Gastroenterology;  Laterality: N/A;  . EUS N/A 03/08/2020   Procedure: UPPER ENDOSCOPIC ULTRASOUND (EUS) LINEAR;  Surgeon: Irving Copas., MD;  Location: [place];  Service: Gastroenterology;  Laterality: N/A;  . IR FLUORO GUIDE CV LINE LEFT  01/07/2020  . IR FLUORO GUIDE CV LINE LEFT   03/09/2020  . IR FLUORO GUIDE CV LINE LEFT  05/09/2020  . IR PTA VENOUS EXCEPT DIALYSIS CIRCUIT  01/07/2020  . IR US GUIDE VASC ACCESS LEFT     x 2 06/17/19 and 09/14/2019  . KNEE SURGERY     right knee   . REMOVAL OF STONES  11/26/2019   Procedure: REMOVAL OF STONES;  Surgeon: Jackquline Denmark, MD;  Location: [facility];  Service: Endoscopy;;  . REMOVAL OF STONES  03/08/2020   Procedure: REMOVAL OF STONES;  Surgeon: Irving Copas., MD;  Location: [place];  Service: Gastroenterology;;  . SMALL INTESTINE SURGERY     x3  . SPHINCTEROTOMY  11/26/2019   Procedure: SPHINCTEROTOMY;  Surgeon: Jackquline Denmark, MD;  Location: [facility];  Service: Endoscopy;;  . UPPER GASTROINTESTINAL ENDOSCOPY      Social History:  reports that she has quit smoking. She has never used smokeless tobacco. She reports previous alcohol use. She reports that she does not use drugs.  Allergies: Allergies  Allergen Reactions  . Cefepime Other (See Comments)    Neurotoxicity occurring in setting of AKI. Ceftriaxone tolerated during same admit  . Gabapentin Other (See Comments)    unknown  . Hyoscyamine Hives and Swelling    Legs swelling   Disorientation  . Lyrica [Pregabalin] Other (See Comments)    unknown  . Meperidine Hives    Other reaction(s): GI Upset Due to [place]   . Topamax [Topiramate] Other (See Comments)    unknown  . Fentanyl Rash    Pt is allergic to fentanyl patch related to the glue (gives her a rash) Pt states she is NOT allergic to fentanyl IV medicine  . Morphine And Related Rash    Family History:  Family History  Problem Relation Age of Onset  . Breast cancer Sister   . Multiple sclerosis Sister   . Diabetes Sister   . Lupus Sister   . Colon cancer Other   . Crohn's disease Other   . Seizures Mother   . Glaucoma Mother   . CAD Father   . Heart disease Father   . Hypertension Father      Current Outpatient Medications:  .  acetaminophen (TYLENOL) 325 MG  tablet, Take 650 mg by mouth every 6 (six) hours as needed for mild pain. , Disp: , Rfl:  .  amLODipine (NORVASC) 10 MG tablet, Take 1 tablet (10 mg total) by mouth daily., Disp: 30 tablet, Rfl: 0 .  budesonide (ENTOCORT EC) 3 MG 24 hr capsule, TAKE 3 CAPSULES BY MOUTH ONCE DAILY (Patient taking differently: Take 6 mg by mouth daily. ), Disp: 90 capsule, Rfl: 0 .  buPROPion (WELLBUTRIN SR) 100 MG 12 hr tablet, Take 2 tablets by mouth once daily, Disp: 60 tablet, Rfl: 0 .  cholecalciferol (VITAMIN D3) 25 MCG (1000 UT) tablet, Take 1,000 Units by mouth daily. , Disp: , Rfl:  .  cycloSPORINE (RESTASIS) 0.05 % ophthalmic emulsion, Place 1 drop into both eyes 2 (two) times daily., Disp: , Rfl:  .  dexlansoprazole (DEXILANT) 60 MG capsule, Take 1 capsule (60 mg total) by mouth daily., Disp: 90 capsule, Rfl: 1 .  Dextran 70-Hypromellose 0.1-0.3 %  SOLN, Place 1 drop into both eyes 4 (four) times daily., Disp: , Rfl:  .  dicyclomine (BENTYL) 20 MG tablet, Take 20 mg by mouth 3 (three) times daily., Disp: , Rfl:  .  diphenoxylate-atropine (LOMOTIL) 2.5-0.025 MG tablet, Take 1 tablet by mouth 4 (four) times daily as needed for diarrhea or loose stools., Disp: 30 tablet, Rfl: 0 .  DULoxetine (CYMBALTA) 30 MG capsule, Take 3 capsules (90 mg total) by mouth daily., Disp: 90 capsule, Rfl: 1 .  DULoxetine (CYMBALTA) 60 MG capsule, Take 60 mg by mouth daily. Takes along with 30 mg tablet (Total 90 mg), Disp: , Rfl:  .  estradiol (ESTRACE) 2 MG tablet, Take 1 tablet (2 mg total) by mouth daily., Disp: 90 tablet, Rfl: 1 .  famotidine (PEPCID) 20 MG tablet, Take 20 mg by mouth at bedtime., Disp: , Rfl:  .  hydrALAZINE (APRESOLINE) 50 MG tablet, Take 1 tablet (50 mg total) by mouth 3 (three) times daily., Disp: 90 tablet, Rfl: 0 .  HYDROmorphone (DILAUDID) 4 MG tablet, Take 1 tablet (4 mg total) by mouth every 6 (six) hours as needed for severe pain., Disp: 10 tablet, Rfl: 0 .  lipase/protease/amylase (CREON) 36000 UNITS  CPEP capsule, Take 1 capsule (36,000 Units total) by mouth 3 (three) times daily as needed (with meals for digestion)., Disp: 180 capsule, Rfl: 0 .  loperamide (IMODIUM) 2 MG capsule, TAKE 1 CAPSULE BY MOUTH AS NEEDED FOR DIARRHEA OR  LOOSE  STOOLS, Disp: 90 capsule, Rfl: 0 .  methadone (DOLOPHINE) 5 MG tablet, Take 5 mg by mouth 5 (five) times daily. , Disp: , Rfl:  .  metoprolol tartrate (LOPRESSOR) 100 MG tablet, Take 1 tablet (100 mg total) by mouth 2 (two) times daily., Disp: 90 tablet, Rfl: 1 .  Multiple Vitamins-Minerals (MULTIVITAMIN ADULT PO), Take 1 tablet by mouth daily., Disp: , Rfl:  .  Omega-3 1000 MG CAPS, Take 1,000 mg by mouth daily. , Disp: , Rfl:  .  piperacillin-tazobactam (ZOSYN) IVPB, Inject 13.5 g into the vein continuous for 20 days. Infuse piperacillin/tazobactam 13.5gm daily as continuous infusion over 24h Indication: Pseudomonas [place] bacteremia First Dose: Yes Last Day of Therapy:  05/30/2020 Labs - Once weekly:  CBC/D and BMP, Labs - Every other week:  ESR and CRP Method of administration: Elastomeric (Continuous infusion) Method of administration may be changed at the discretion of home infusion pharmacist based upon assessment of the patient and/or caregiver's ability to self-administer the medication ordered., Disp: 20 Units, Rfl: 0 .  potassium chloride SA (KLOR-CON) 20 MEQ tablet, Take 40 mEq by mouth daily., Disp: , Rfl:  .  PRESCRIPTION MEDICATION, Inject 1 each into the vein daily. Home TPN . [employer] in [facility] . 1 bag for 16 hours., Disp: , Rfl:  .  Probiotic Product (PROBIOTIC-10 PO), Take 2 capsules by mouth daily. , Disp: , Rfl:  .  promethazine (PHENERGAN) 25 MG tablet, TAKE 1 TABLET BY MOUTH EVERY 6 HOURS AS NEEDED FOR NAUSEA AND VOMITING, Disp: 90 tablet, Rfl: 0 .  sucralfate (CARAFATE) 1 GM/10ML suspension, Take 10 mLs (1 g total) by mouth 4 (four) times daily -  with meals and at bedtime. (Patient taking differently: Take 1 g by mouth 4 (four) times  daily. ), Disp: 420 mL, Rfl: 2 .  traZODone (DESYREL) 100 MG tablet, Take 2 tablets (200 mg total) by mouth at bedtime as needed for sleep., Disp: 60 tablet, Rfl: 0 .  vitamin B-12 (CYANOCOBALAMIN) 100  MCG tablet, Take 1,000 mcg by mouth daily. , Disp: , Rfl:   Review of Systems:  Constitutional: Denies fever, chills, diaphoresis, appetite change. HEENT: Denies photophobia, eye pain, redness, hearing loss, ear pain, congestion, sore throat, rhinorrhea, sneezing, mouth sores, trouble swallowing, neck pain, neck stiffness and tinnitus.   Respiratory: Denies SOB, DOE, cough, chest tightness,  and wheezing.   Cardiovascular: Denies chest pain, palpitations and leg swelling.  Gastrointestinal: Denies nausea, vomiting, abdominal pain, diarrhea, constipation, blood in stool and abdominal distention.  Genitourinary: Denies dysuria, urgency, frequency, hematuria, flank pain and difficulty urinating.  Endocrine: Denies: hot or cold intolerance, sweats, changes in hair or nails, polyuria, polydipsia. Musculoskeletal: Denies myalgias, back pain, joint swelling, arthralgias and gait problem.  Skin: Denies pallor, rash and wound.  Neurological: Denies dizziness, seizures, syncope, weakness, light-headedness and headaches.  Hematological: Denies adenopathy. Easy bruising, personal or family bleeding history  Psychiatric/Behavioral: Denies suicidal ideation, mood changes, confusion, nervousness, sleep disturbance and agitation    Physical Exam: Vitals:   05/24/20 1536  BP: 110/70  Pulse: 95  Temp: 97.7 F (36.5 C)  TempSrc: Temporal  SpO2: 98%  Weight: 145 lb 1.6 oz (65.8 kg)  Height: 5' 8"  (1.727 m)    Body mass index is 22.06 kg/m.   Constitutional: NAD, calm, comfortable Eyes: PERRL, lids and conjunctivae normal, wears corrective lenses ENMT: Mucous membranes are moist. Respiratory: clear to auscultation bilaterally, no wheezing, no crackles. Normal respiratory effort. No accessory muscle  use.  Cardiovascular: Regular rate and rhythm, no murmurs / rubs / gallops. No extremity edema. Abdomen: No tenderness to palpation Neurologic: Grossly intact and nonfocal. Psychiatric: Normal judgment and insight. Alert and oriented x 3. Normal mood.    Impression and Plan:  Hospital discharge follow-up   Stage 3b chronic kidney disease Crohn's disease of small and large intestines with complication ([name]) Chronic pain syndrome  Essential hypertension  Hyperlipidemia associated with type 2 diabetes mellitus ([name])  Gastroesophageal reflux disease without esophagitis Bacteremia  -She has been doing relatively well post discharge. -She is eating well and has been able to maintain and slightly increase her weight. -Her PICC site appears clean, she will be on Zosyn until July 5. -She has follow-up with her GI at [facility] in August.  Numbness and tingling in both hands - Plan: Vitamin B12, VITAMIN D 25 Hydroxy (Vit-D Deficiency, Fractures), TSH, Basic metabolic panel, Magnesium -Highly suspicious for vitamin B12 deficiency given her history of Crohn's disease and short gut syndrome.    Medical decision making of moderate complexity was utilized today.  Patient Instructions  -Nice seeing you today!!  -Lab work today; will notify you once results are available.  -Return for your physical as scheduled for later this year. Please come in fasting that day.     [name], MD [place] [facility]

## 2020-05-24 NOTE — Telephone Encounter (Signed)
OK to pull after last dose? Will need to confirm with [facility].

## 2020-05-24 NOTE — Addendum Note (Signed)
Addended by: Marrion Coy on: 05/24/2020 04:10 PM   Modules accepted: Orders

## 2020-05-24 NOTE — Telephone Encounter (Signed)
No she is TPN dependent which is the reason we'll be here again

## 2020-05-25 LAB — BASIC METABOLIC PANEL
BUN: 33 mg/dL — ABNORMAL HIGH (ref 6–23)
CO2: 26 mEq/L (ref 19–32)
Calcium: 9.4 mg/dL (ref 8.4–10.5)
Chloride: 103 mEq/L (ref 96–112)
Creatinine, Ser: 2.14 mg/dL — ABNORMAL HIGH (ref 0.40–1.20)
GFR: 28.52 mL/min — ABNORMAL LOW (ref >60.00)
Glucose, Bld: 68 mg/dL — ABNORMAL LOW (ref 70–99)
Potassium: 3.6 mEq/L (ref 3.5–5.1)
Sodium: 139 mEq/L (ref 135–145)

## 2020-05-25 LAB — VITAMIN D 25 HYDROXY (VIT D DEFICIENCY, FRACTURES): VITD: 44.31 ng/mL (ref 30.00–100.00)

## 2020-05-25 LAB — VITAMIN B12: Vitamin B-12: 710 pg/mL (ref 211–911)

## 2020-05-25 LAB — MAGNESIUM: Magnesium: 2.4 mg/dL (ref 1.5–2.5)

## 2020-05-25 LAB — TSH: TSH: 0.54 u[IU]/mL (ref 0.35–4.50)

## 2020-05-25 NOTE — Telephone Encounter (Signed)
Confirmed with [facility] that PICC is to remain after last dose as patient is TPN dependent per Dr. Tommy Medal.   Carlean Purl, RN

## 2020-05-31 ENCOUNTER — Telehealth: Payer: Self-pay | Admitting: Internal Medicine

## 2020-05-31 NOTE — Telephone Encounter (Signed)
FYI Spoke with patient and no further nose bleeds today.

## 2020-05-31 NOTE — Telephone Encounter (Signed)
Pt is calling in stating that she has been having nose bleeds (heavy with clots feel that she can taste it) this past Saturday 05/28/2020 and Sunday 05/29/2020 lasting about 30 mins at a time and a short one (very light) on Monday 05/30/2020 after getting out of the shower it stopped.  With nose bleeds pt states she did not seek medical care was not worried due to her daughter being a nurse and aware of pts health condition.  Right hand is getting worse.

## 2020-06-02 ENCOUNTER — Telehealth: Payer: Self-pay | Admitting: *Deleted

## 2020-06-02 DIAGNOSIS — F419 Anxiety disorder, unspecified: Secondary | ICD-10-CM

## 2020-06-02 NOTE — Telephone Encounter (Signed)
DULoxetine (CYMBALTA) 30 MG capsule is not covered by [employer].  Would you like to change the medication or start a PA?

## 2020-06-02 NOTE — Telephone Encounter (Signed)
This is a chronic med for her. Let's start PA please.

## 2020-06-03 DIAGNOSIS — F419 Anxiety disorder, unspecified: Secondary | ICD-10-CM | POA: Insufficient documentation

## 2020-06-03 NOTE — Telephone Encounter (Signed)
Prior Auth approved Dx anxiety Ref: [id number]

## 2020-06-03 NOTE — Telephone Encounter (Signed)
Caitlin Peters from [facility] called to verify the diagnosis for this Rx   [phone] [phone]

## 2020-06-03 NOTE — Telephone Encounter (Signed)
Prior auth started  Key: [id number]

## 2020-06-06 ENCOUNTER — Telehealth: Payer: Self-pay | Admitting: Internal Medicine

## 2020-06-06 DIAGNOSIS — M816 Localized osteoporosis [Lequesne]: Secondary | ICD-10-CM

## 2020-06-06 DIAGNOSIS — I10 Essential (primary) hypertension: Secondary | ICD-10-CM

## 2020-06-06 NOTE — Progress Notes (Signed)
  Chronic Care Management   Note  06/06/2020 Name: Caitlin Peters MRN: [id number] DOB: 23-Jul-1961  Caitlin Peters is a 59 y.o. year old female who is a primary care patient of [name], [name], MD. I reached out to [name] by phone today in response to a referral sent by Ms. Bubba Hales Weygandt's PCP, [name], [name], MD.   Caitlin Peters was given information about Chronic Care Management services today including:  1. CCM service includes personalized support from designated clinical staff supervised by her physician, including individualized plan of care and coordination with other care providers 2. 24/7 contact phone numbers for assistance for urgent and routine care needs. 3. Service will only be billed when office clinical staff spend 20 minutes or more in a month to coordinate care. 4. Only one practitioner may furnish and bill the service in a calendar month. 5. The patient may stop CCM services at any time (effective at the end of the month) by phone call to the office staff.   Patient agreed to services and verbal consent obtained.   Follow up plan:  [place]

## 2020-06-07 NOTE — Telephone Encounter (Signed)
Has been approved through 11/25/20.

## 2020-06-10 ENCOUNTER — Other Ambulatory Visit: Payer: Self-pay | Admitting: Internal Medicine

## 2020-06-20 NOTE — Chronic Care Management (AMB) (Signed)
Chronic Care Management Pharmacy  Name: Caitlin Peters  MRN: [id number] DOB: March 24, 1961   Chief Complaint/ HPI  Caitlin Peters,  59 y.o. , female presents for their Initial CCM visit with the clinical pharmacist In office.  PCP : [name], [name], MD Patient Care Team: [name], [name], MD as PCP - General (Internal Medicine) Germaine Pomfret, [facility] as Pharmacist (Pharmacist)  Their chronic conditions include: Hypertension, GERD, Chronic Kidney Disease, Depression, Osteoporosis and Chronic Pain, Chron's Disease   Office Visits: 05/24/20: Patient presented to Dr. Jerilee Hoh for hospital follow-up. Kidney function worsened. Patient to continued Zosyn until 05/30/20.  04/19/20: Patient presented to Dr. Jerilee Hoh for hospital follow-up.   Consult Visit: 05/16/20: Patient presented to Dr. Roderic Scarce (ID) for PICC infection. PICC line dry, intact, no signs of infections. To continue with Zosyn.  6/10-6/15/21: Patient hospitalized for Pseudomonas bacteremia. Repeat Cx on 6/15 negative. 5/19-5/24/21: Patient  Hospitalized for intractable nausea and vomiting. Home TPN infusions resumed.  04/13/20: Patient presented to Dr. Roderic Scarce (ID) for infection follow-up. Patient hospitalized for concerns of pancreatitis.  04/01/20: Patient presented to Dr. Domenica Fail (GI) for chron's disease with short bowel syndrome.  03/04/20-03/11/20: Patient hospitalized for sepsis. IV Bactrim until 03/29/20.   Allergies  Allergen Reactions  . Cefepime Other (See Comments)    Neurotoxicity occurring in setting of AKI. Ceftriaxone tolerated during same admit  . Gabapentin Other (See Comments)    unknown  . Hyoscyamine Hives and Swelling    Legs swelling   Disorientation  . Lyrica [Pregabalin] Other (See Comments)    unknown  . Meperidine Hives    Other reaction(s): GI Upset Due to Chrones   . Topamax [Topiramate] Other (See Comments)    unknown  . Fentanyl Rash    Pt is allergic to fentanyl  patch related to the glue (gives her a rash) Pt states she is NOT allergic to fentanyl IV medicine  . Morphine And Related Rash    Medications: Outpatient Encounter Medications as of 06/22/2020  Medication Sig  . acetaminophen (TYLENOL) 325 MG tablet Take 650 mg by mouth every 6 (six) hours as needed for mild pain.   . budesonide (ENTOCORT EC) 3 MG 24 hr capsule TAKE 3 CAPSULES BY MOUTH ONCE DAILY (Patient taking differently: Take 4 mg by mouth daily. )  . buPROPion (WELLBUTRIN SR) 100 MG 12 hr tablet Take 2 tablets by mouth once daily  . calcium carbonate (TUMS - DOSED IN MG ELEMENTAL CALCIUM) 500 MG chewable tablet Chew 1 tablet by mouth 3 (three) times daily as needed for indigestion or heartburn.  . cholecalciferol (VITAMIN D3) 25 MCG (1000 UT) tablet Take 1,000 Units by mouth daily.   . cycloSPORINE (RESTASIS) 0.05 % ophthalmic emulsion Place 1 drop into both eyes 2 (two) times daily.  [name] Kitchen dexlansoprazole (DEXILANT) 60 MG capsule Take 1 capsule (60 mg total) by mouth daily.  [name] Kitchen Dextran 70-Hypromellose 0.1-0.3 % SOLN Place 1 drop into both eyes 4 (four) times daily.  [name] Kitchen dicyclomine (BENTYL) 20 MG tablet Take 20 mg by mouth 3 (three) times daily.  . diphenoxylate-atropine (LOMOTIL) 2.5-0.025 MG tablet Take 1 tablet by mouth 4 (four) times daily as needed for diarrhea or loose stools.  . DULoxetine (CYMBALTA) 30 MG capsule Take 3 capsules (90 mg total) by mouth daily.  [name] Kitchen estradiol (ESTRACE) 2 MG tablet Take 1 tablet (2 mg total) by mouth daily.  . famotidine (PEPCID) 20 MG tablet Take 20 mg by mouth at bedtime.  [name] Kitchen  HYDROmorphone (DILAUDID) 4 MG tablet Take 1 tablet (4 mg total) by mouth every 6 (six) hours as needed for severe pain.  [name] Kitchen lipase/protease/amylase (CREON) 36000 UNITS CPEP capsule Take 1 capsule (36,000 Units total) by mouth 3 (three) times daily as needed (with meals for digestion).  [name] Kitchen loperamide (IMODIUM) 2 MG capsule TAKE 1 CAPSULE BY MOUTH AS NEEDED FOR DIARRHEA OR  LOOSE  STOOLS    . methadone (DOLOPHINE) 5 MG tablet Take 5 mg by mouth 5 (five) times daily.   . metoprolol tartrate (LOPRESSOR) 100 MG tablet Take 1 tablet (100 mg total) by mouth 2 (two) times daily.  . Multiple Vitamins-Minerals (MULTIVITAMIN ADULT PO) Take 1 tablet by mouth daily.  . Omega-3 1000 MG CAPS Take 1,000 mg by mouth daily.   . potassium chloride SA (KLOR-CON) 20 MEQ tablet Take 40 mEq by mouth daily.  [name] Kitchen PRESCRIPTION MEDICATION Inject 1 each into the vein daily. Home TPN . [employer] in [facility] . 1 bag for 16 hours.  . Probiotic Product (PROBIOTIC-10 PO) Take 2 capsules by mouth daily.   . promethazine (PHENERGAN) 25 MG tablet TAKE 1 TABLET BY MOUTH EVERY 6 HOURS AS NEEDED FOR NAUSEA AND VOMITING  . sucralfate (CARAFATE) 1 GM/10ML suspension Take 10 mLs (1 g total) by mouth 4 (four) times daily -  with meals and at bedtime. (Patient taking differently: Take 1 g by mouth 4 (four) times daily. )  . traZODone (DESYREL) 100 MG tablet Take 2 tablets (200 mg total) by mouth at bedtime as needed for sleep.  . vitamin B-12 (CYANOCOBALAMIN) 100 MCG tablet Take 1,000 mcg by mouth daily.   [name] Kitchen amLODipine (NORVASC) 10 MG tablet Take 1 tablet (10 mg total) by mouth daily.  . DULoxetine (CYMBALTA) 60 MG capsule Take 60 mg by mouth daily. Takes along with 30 mg tablet (Total 90 mg) (Patient not taking: Reported on 06/22/2020)  . hydrALAZINE (APRESOLINE) 50 MG tablet Take 1 tablet (50 mg total) by mouth 3 (three) times daily.   No facility-administered encounter medications on file as of 06/22/2020.     Current Diagnosis/Assessment:  SDOH Interventions     Most Recent Value  SDOH Interventions  Financial Strain Interventions Intervention Not Indicated  Transportation Interventions Intervention Not Indicated      Goals Addressed            This Visit's Progress   . Patient [place] (see longitudinal plan of care for additional care plan information)  Current Barriers:   . Chronic Disease Management support, education, and care coordination needs related to Hypertension, GERD, Chronic Kidney Disease, Depression, Osteoporosis and Chronic Pain, Chron's Disease    Hypertension BP Readings from Last 3 Encounters:  05/24/20 110/70  05/10/20 (!) 165/70  04/19/20 120/78   . Pharmacist Clinical Goal(s): o Over the next 90 days, patient will work with PharmD and providers to achieve BP goal <140/90 . Current regimen:  . Amlodipine 10 mg daily  . Hydralazine 50 mg three times daily  . Metoprolol tartrate 100 mg twice daily  . Interventions: o Discussed low salt diet and exercising as tolerated extensively . Patient self care activities - Over the next 90 days, patient will: o Check blood pressure weekly, document, and provide at future appointments o Ensure daily salt intake < 2300 mg/day Osteoporosis . Pharmacist Clinical Goal(s) o Over the next 90 days, patient will work with PharmD and providers to maintain bone health and  prevent fractures  . Current regimen:  o Vitamin D3 1000 units daily  o Tums 500 mg three times daily as needed o Reclast 5 mg injection every 12 months . Interventions: o Recommend rechecking DEXA scan  . Patient self care activities - Over the next 90 days, patient will: o Maintain [phone] units of vitamin D daily o Maintain 1200 mg of calcium daily from dietary and supplemental sources.  Medication management . Pharmacist Clinical Goal(s): o Over the next 90 days, patient will work with PharmD and providers to achieve optimal medication adherence . Current pharmacy: [facility] . Interventions o Comprehensive medication review performed. o Utilize [facility] for medication synchronization, packaging and delivery o Verbal consent obtained for [facility] enhanced pharmacy services (medication synchronization, adherence packaging, delivery coordination). A medication sync plan was created to allow patient to get all  medications delivered once every 30 to 90 days per patient preference. Patient understands they have freedom to choose pharmacy and clinical pharmacist will coordinate care between all prescribers and [facility].  . Patient self care activities - Over the next 90 days, patient will: o Take medications as prescribed o Report any questions or concerns to PharmD and/or provider(s)      Hypertension   BP goal is:  <140/90  Office blood pressures are  BP Readings from Last 3 Encounters:  05/24/20 110/70  05/10/20 (!) 165/70  04/19/20 120/78   CMP Latest Ref Rng & Units 05/24/2020 05/10/2020 05/09/2020  Glucose 70 - 99 mg/dL 68(L) 93 108(H)  BUN 6 - 23 mg/dL 33(H) 11 13  Creatinine 0.40 - 1.20 mg/dL 2.14(H) 1.72(H) 1.72(H)  Sodium 135 - 145 mEq/L 139 141 140  Potassium 3.5 - 5.1 mEq/L 3.6 3.3(L) 3.3(L)  Chloride 96 - 112 mEq/L 103 110 107  CO2 19 - 32 mEq/L 26 23 24   Calcium 8.4 - 10.5 mg/dL 9.4 9.2 8.9  Total Protein 6.5 - 8.1 g/dL - 7.0 6.8  Total Bilirubin 0.3 - 1.2 mg/dL - 1.1 0.9  Alkaline Phos 38 - 126 U/L - 199(H) 183(H)  AST 15 - 41 U/L - 37 49(H)  ALT 0 - 44 U/L - 182(H) 235(H)     Patient checks BP at home weekly when home health nurse arrives.  Patient home BP readings are ranging: 140-150/60-80   Patient has failed these meds in the past: n/a Patient is currently uncontrolled on the following medications:  . Amlodipine 10 mg daily  . Hydralazine 50 mg TID  . Metoprolol tartrate 100 mg BID   We discussed diet and exercise extensively. Tolerating changes to blood pressure regimen well.   Plan  Continue current medications   Osteopenia / Osteoporosis   Last DEXA Scan: "It's been a while" last checked in [place].    T-Score femoral neck: n/a  T-Score total hip: n/a  T-Score lumbar spine: n/a  T-Score forearm radius: n/a  10-year probability of major osteoporotic fracture: n/a  10-year probability of hip fracture: n/a  VITD  Date Value Ref Range Status    05/24/2020 44.31 30.00 - 100.00 ng/mL Final     Patient is a candidate for pharmacologic treatment.  Patient has failed these meds in past:  Patient is currently controlled on the following medications:  [name] Kitchen Vitamin D3 1000 units daily  . Tums (Typically takes TID)  . Reclast (taking for ~10 years), last dose ~August 2020.   We discussed:  Recommend [phone] units of vitamin D daily. Recommend 1200 mg of calcium daily from  dietary and supplemental sources.  Plan  Recommend follow-up DEXA Scan.  Given duration of bisphosphonate therapy, would be reasonable to begin bisphosphonate holiday at this time.   GERD   Patient has failed these meds in past: n/a Patient is currently controlled on the following medications:  . Dexlansoprazole 60 mg daily  . Famotidine 20 mg QHS  . Tums TID PRN   Plan  Continue current medications  Chron's Disease / Short Bowel Syndrome    Patient has failed these meds in past: Gattex Patient is currently uncontrolled on the following medications:  . Budesonide 54m (2 caps qAM) . Creon 36,000 units TID PRN  . Dicyclomine 20 mg TID  . Lomotil 2.5-0.025 mg QID PRN  . Loperamide 2 mg PRN  . Probiotic 2 caps daily  . Promethazine 25 mg q6hr PRN  . Sucralfate 1g/129mQID  . TPN   We discussed:  Diarrhea worsening recently, patient states she has been having more frequent, painful diarrhea despite adherence to above regimen. Patient states she may be doing a retrial of Gattex at a lower dose to try and provide relief to her short gut symptoms.   Plan  Continue current medications   Chronic Pain   [facility] failed these meds in past: n/a Patient is currently controlled on the following medications:  . APAP 325 mg 2 tabs q6hr PRN (headaches/ muscle pain)  . Hydromorphone 4 mg q6hr PRN  . Methadone 5 mg five times daily   We discussed:  Pain is mostly well controlled, although she states it can be very painful if she ever  forgets to take her pain medications. She states she is "diligent" and almost always takes the maximum prescribed doses of Dilaudid and methadone. She has Naloxone at home.   Plan  Continue current medications   Depression / Anxiety   PHQ9 Score:  PHQ9 SCORE ONLY 04/19/2020 10/16/2019  PHQ-9 Total Score 4 2   GAD7 Score: No flowsheet data found.  Patient has failed these meds in past: n/a Patient is currently controlled on the following medications:  . Duloxetine 30 mg 3 caps daily  . Bupropion SR 100 mg 2 tablets daily   We discussed:  Mood has been well managed on current regimen.   Plan  Recommend decreasing Bupropion to 150 mg daily (Recommended max dose for CrCl 15-60 mL/min)  Insomnia    Patient has failed these meds in past: n/a Patient is currently controlled on the following medications:  . Trazodone 100 mg 2 tabs QHS PRN (once monthly)   We discussed:  Sleep is mostly well controlled, reports she will take trazodone only when she has not slept for multiple days in a row. When she does take it she reports feeling very sedated the next day.   Plan  Recommend decreasing Trazodone to 100 mg QHS PRN.   Misc / OTC    . Cyclosporine 0.05% soln 1 drop BID  . Dextran 70-hypromellose 0.1-0.3% soln 1 drop QID  . Estradiol 2 mg daily  . Multivitamin daily (Centrum 50+)  . Omega-3 1000 mg daily  . Potassium Chloride 40 mEq daily  . Vitamin B12 1000 mcg daily   Plan  Continue current medications   Medication Management   [place] [place] Uses pill box? Yes, but feels overwhelmed with trying to keep up with all of her medications herself.   Plan  Utilize [facility] for medication synchronization, packaging and delivery  Verbal consent obtained for [facility] enhanced pharmacy services (medication synchronization, adherence packaging, delivery coordination). A medication sync plan was created to allow patient to get all  medications delivered once every 30 to 90 days per patient preference. Patient understands they have freedom to choose pharmacy and clinical pharmacist will coordinate care between all prescribers and [facility].  Follow up: 3 month phone visit  [facility] [place]

## 2020-06-21 ENCOUNTER — Ambulatory Visit: Payer: Medicare Other | Admitting: Internal Medicine

## 2020-06-21 NOTE — Addendum Note (Signed)
Addended by: Westley Hummer B on: 06/21/2020 10:22 AM   Modules accepted: Orders

## 2020-06-21 NOTE — Progress Notes (Signed)
Apolonio Schneiders,  Can you please place a referral for this patient to be seen for chronic care management?   Thanks,  Doristine Section Clinical Pharmacist [facility] [place]

## 2020-06-22 ENCOUNTER — Ambulatory Visit: Payer: Medicare Other

## 2020-06-22 DIAGNOSIS — M816 Localized osteoporosis [Lequesne]: Secondary | ICD-10-CM

## 2020-06-22 DIAGNOSIS — I10 Essential (primary) hypertension: Secondary | ICD-10-CM

## 2020-06-22 NOTE — Patient Instructions (Addendum)
Visit Information It was great speaking with you today!  Please let me know if you have any questions about our visit.  Goals Addressed            This Visit's Progress   . Patient Caitlin Peters (see longitudinal plan of care for additional care plan information)  Current Barriers:  . Chronic Disease Management support, education, and care coordination needs related to Hypertension, GERD, Chronic Kidney Disease, Depression, Osteoporosis and Chronic Pain, Chron's Disease    Hypertension BP Readings from Last 3 Encounters:  05/24/20 110/70  05/10/20 (!) 165/70  04/19/20 120/78   . Pharmacist Clinical Goal(s): o Over the next 90 days, patient will work with PharmD and providers to achieve BP goal <140/90 . Current regimen:  . Amlodipine 10 mg daily  . Hydralazine 50 mg three times daily  . Metoprolol tartrate 100 mg twice daily  . Interventions: o Discussed low salt diet and exercising as tolerated extensively . Patient self care activities - Over the next 90 days, patient will: o Check blood pressure weekly, document, and provide at future appointments o Ensure daily salt intake < 2300 mg/day Osteoporosis . Pharmacist Clinical Goal(s) o Over the next 90 days, patient will work with PharmD and providers to maintain bone health and prevent fractures  . Current regimen:  o Vitamin D3 1000 units daily  o Tums 500 mg three times daily as needed o Reclast 5 mg injection every 12 months . Interventions: o Recommend rechecking DEXA scan  . Patient self care activities - Over the next 90 days, patient will: o Maintain [phone] units of vitamin D daily o Maintain 1200 mg of calcium daily from dietary and supplemental sources.  Medication management . Pharmacist Clinical Goal(s): o Over the next 90 days, patient will work with PharmD and providers to achieve optimal medication adherence . Current pharmacy: [facility] . Interventions o Comprehensive medication review  performed. o Utilize [facility] for medication synchronization, packaging and delivery o Verbal consent obtained for [facility] enhanced pharmacy services (medication synchronization, adherence packaging, delivery coordination). A medication sync plan was created to allow patient to get all medications delivered once every 30 to 90 days per patient preference. Patient understands they have freedom to choose pharmacy and clinical pharmacist will coordinate care between all prescribers and [facility].  . Patient self care activities - Over the next 90 days, patient will: o Take medications as prescribed o Report any questions or concerns to PharmD and/or provider(s)       Caitlin Peters was given information about Chronic Care Management services today including:  1. CCM service includes personalized support from designated clinical staff supervised by her physician, including individualized plan of care and coordination with other care providers 2. 24/7 contact phone numbers for assistance for urgent and routine care needs. 3. Standard insurance, coinsurance, copays and deductibles apply for chronic care management only during months in which we provide at least 20 minutes of these services. Most insurances cover these services at 100%, however patients may be responsible for any copay, coinsurance and/or deductible if applicable. This service may help you avoid the need for more expensive face-to-face services. 4. Only one practitioner may furnish and bill the service in a calendar month. 5. The patient may stop CCM services at any time (effective at the end of the month) by phone call to the office staff.  Patient agreed to services and verbal consent obtained.  The patient verbalized understanding of instructions provided today and agreed to receive a mailed copy of patient instruction and/or educational materials. Telephone follow up appointment with pharmacy team member  scheduled for: 09/20/20 at 8:30 AM   [name] at [facility]  [phone]

## 2020-06-23 ENCOUNTER — Other Ambulatory Visit: Payer: Self-pay

## 2020-06-23 ENCOUNTER — Encounter: Payer: Self-pay | Admitting: Internal Medicine

## 2020-06-23 ENCOUNTER — Ambulatory Visit (INDEPENDENT_AMBULATORY_CARE_PROVIDER_SITE_OTHER): Payer: Medicare Other | Admitting: Internal Medicine

## 2020-06-23 VITALS — BP 110/64 | HR 61 | Temp 98.3°F | Wt 145.1 lb

## 2020-06-23 DIAGNOSIS — Z780 Asymptomatic menopausal state: Secondary | ICD-10-CM | POA: Diagnosis not present

## 2020-06-23 DIAGNOSIS — I10 Essential (primary) hypertension: Secondary | ICD-10-CM | POA: Diagnosis not present

## 2020-06-23 DIAGNOSIS — L821 Other seborrheic keratosis: Secondary | ICD-10-CM

## 2020-06-23 DIAGNOSIS — Z13820 Encounter for screening for osteoporosis: Secondary | ICD-10-CM | POA: Diagnosis not present

## 2020-06-23 DIAGNOSIS — R200 Anesthesia of skin: Secondary | ICD-10-CM

## 2020-06-23 DIAGNOSIS — R202 Paresthesia of skin: Secondary | ICD-10-CM

## 2020-06-23 MED ORDER — AMLODIPINE BESYLATE 10 MG PO TABS: 10.0000 mg | ORAL_TABLET | Freq: Every day | ORAL | 1 refills | Status: DC

## 2020-06-23 NOTE — Patient Instructions (Signed)
-  Nice seeing you today!!  -Referral to sports medicine today, for your hand.  -Bone density scan requested.  -Schedule follow up in 6 months.

## 2020-06-23 NOTE — Progress Notes (Signed)
Established Patient Office Visit     This visit occurred during the SARS-CoV-2 public health emergency.  Safety protocols were in place, including screening questions prior to the visit, additional usage of staff PPE, and extensive cleaning of exam room while observing appropriate contact time as indicated for disinfecting solutions.    CC/Reason for Visit: Discuss several acute concerns  HPI: Caitlin Peters is a 59 y.o. female who is coming in today for the above mentioned reasons. Past Medical History is mainly significant for Crohn's disease with multiple bowel resections and resultant short gut syndrome, multiple PICC line infections requiring hospitalizations.  She is here today to discuss an acute concern   1.  She continues to have right greater than left hand tingling.  It is, of all of her fingertips and she describes swelling of the thenar eminence.  #2 she had a nosebleed the weekend of July 4.  It was self controlled.  3.  She wants to know if we want to continue her amlodipine and if so needs a refill.  4.  She is wondering whether a DEXA scan would be a good option for her.  5.  She has 2 "cysts" on her scalp that she would like me to look at.   Past Medical/Surgical History: Past Medical History:  Diagnosis Date  . Acute pancreatitis 04/13/2020  . Anasarca 10/2019  . AVN (avascular necrosis of bone) ([place])   . Cataract   . Chronic pain syndrome   . CKD (chronic kidney disease), stage III   . Crohn disease ([place])   . Crohn disease ([place])   . Depression   . Diverticulosis   . GERD (gastroesophageal reflux disease)   . HTN (hypertension)   . IDA (iron deficiency anemia)   . Malnutrition ([place])   . Mass in chest   . Osteoporosis   . Pancreatitis   . Short gut syndrome   . Vitamin B12 deficiency     Past Surgical History:  Procedure Laterality Date  . ABDOMINAL HYSTERECTOMY    . ABDOMINAL SURGERY     colon resection with abdominal stoma, repair of  intestinal leak, reversal of abdominal stoma  . BILIARY DILATION  11/26/2019   Procedure: BILIARY DILATION;  Surgeon: Jackquline Denmark, MD;  Location: [facility];  Service: Endoscopy;;  . BILIARY DILATION  03/08/2020   Procedure: BILIARY DILATION;  Surgeon: Irving Copas., MD;  Location: [place];  Service: Gastroenterology;;  . BIOPSY  03/08/2020   Procedure: BIOPSY;  Surgeon: Irving Copas., MD;  Location: [place];  Service: Gastroenterology;;  . CHEST WALL RESECTION     right thoracotomy,resection of chest mass with anterior rib and reconstruction using prosthetic mesh and video arthroscopy  . CHOLECYSTECTOMY    . COLONOSCOPY  2019  . ERCP N/A 11/26/2019   Procedure: ENDOSCOPIC RETROGRADE CHOLANGIOPANCREATOGRAPHY (ERCP);  Surgeon: Jackquline Denmark, MD;  Location: [facility];  Service: Endoscopy;  Laterality: N/A;  . ERCP N/A 03/08/2020   Procedure: ENDOSCOPIC RETROGRADE CHOLANGIOPANCREATOGRAPHY (ERCP);  Surgeon: Irving Copas., MD;  Location: [place];  Service: Gastroenterology;  Laterality: N/A;  . ESOPHAGOGASTRODUODENOSCOPY N/A 03/08/2020   Procedure: ESOPHAGOGASTRODUODENOSCOPY (EGD);  Surgeon: Irving Copas., MD;  Location: [place];  Service: Gastroenterology;  Laterality: N/A;  . EUS N/A 03/08/2020   Procedure: UPPER ENDOSCOPIC ULTRASOUND (EUS) LINEAR;  Surgeon: Irving Copas., MD;  Location: [place];  Service: Gastroenterology;  Laterality: N/A;  . IR FLUORO GUIDE CV LINE LEFT  01/07/2020  .  IR FLUORO GUIDE CV LINE LEFT  03/09/2020  . IR FLUORO GUIDE CV LINE LEFT  05/09/2020  . IR PTA VENOUS EXCEPT DIALYSIS CIRCUIT  01/07/2020  . IR US GUIDE VASC ACCESS LEFT     x 2 06/17/19 and 09/14/2019  . KNEE SURGERY     right knee   . REMOVAL OF STONES  11/26/2019   Procedure: REMOVAL OF STONES;  Surgeon: Jackquline Denmark, MD;  Location: [facility];  Service: Endoscopy;;  . REMOVAL OF STONES  03/08/2020   Procedure: REMOVAL OF STONES;   Surgeon: Irving Copas., MD;  Location: [place];  Service: Gastroenterology;;  . SMALL INTESTINE SURGERY     x3  . SPHINCTEROTOMY  11/26/2019   Procedure: SPHINCTEROTOMY;  Surgeon: Jackquline Denmark, MD;  Location: [facility];  Service: Endoscopy;;  . UPPER GASTROINTESTINAL ENDOSCOPY      Social History:  reports that she has quit smoking. She has never used smokeless tobacco. She reports previous alcohol use. She reports that she does not use drugs.  Allergies: Allergies  Allergen Reactions  . Cefepime Other (See Comments)    Neurotoxicity occurring in setting of AKI. Ceftriaxone tolerated during same admit  . Gabapentin Other (See Comments)    unknown  . Hyoscyamine Hives and Swelling    Legs swelling   Disorientation  . Lyrica [Pregabalin] Other (See Comments)    unknown  . Meperidine Hives    Other reaction(s): GI Upset Due to [place]   . Topamax [Topiramate] Other (See Comments)    unknown  . Fentanyl Rash    Pt is allergic to fentanyl patch related to the glue (gives her a rash) Pt states she is NOT allergic to fentanyl IV medicine  . Morphine And Related Rash    Family History:  Family History  Problem Relation Age of Onset  . Breast cancer Sister   . Multiple sclerosis Sister   . Diabetes Sister   . Lupus Sister   . Colon cancer Other   . Crohn's disease Other   . Seizures Mother   . Glaucoma Mother   . CAD Father   . Heart disease Father   . Hypertension Father      Current Outpatient Medications:  .  acetaminophen (TYLENOL) 325 MG tablet, Take 650 mg by mouth every 6 (six) hours as needed for mild pain. , Disp: , Rfl:  .  budesonide (ENTOCORT EC) 3 MG 24 hr capsule, TAKE 3 CAPSULES BY MOUTH ONCE DAILY (Patient taking differently: Take 4 mg by mouth daily. ), Disp: 90 capsule, Rfl: 0 .  buPROPion (WELLBUTRIN SR) 100 MG 12 hr tablet, Take 2 tablets by mouth once daily, Disp: 60 tablet, Rfl: 0 .  calcium carbonate (TUMS - DOSED IN MG  ELEMENTAL CALCIUM) 500 MG chewable tablet, Chew 1 tablet by mouth 3 (three) times daily as needed for indigestion or heartburn., Disp: , Rfl:  .  cholecalciferol (VITAMIN D3) 25 MCG (1000 UT) tablet, Take 1,000 Units by mouth daily. , Disp: , Rfl:  .  cycloSPORINE (RESTASIS) 0.05 % ophthalmic emulsion, Place 1 drop into both eyes 2 (two) times daily., Disp: , Rfl:  .  dexlansoprazole (DEXILANT) 60 MG capsule, Take 1 capsule (60 mg total) by mouth daily., Disp: 90 capsule, Rfl: 1 .  Dextran 70-Hypromellose 0.1-0.3 % SOLN, Place 1 drop into both eyes 4 (four) times daily., Disp: , Rfl:  .  dicyclomine (BENTYL) 20 MG tablet, Take 20 mg by mouth 3 (three) times daily., Disp: ,  Rfl:  .  diphenoxylate-atropine (LOMOTIL) 2.5-0.025 MG tablet, Take 1 tablet by mouth 4 (four) times daily as needed for diarrhea or loose stools., Disp: 30 tablet, Rfl: 0 .  DULoxetine (CYMBALTA) 30 MG capsule, Take 3 capsules (90 mg total) by mouth daily., Disp: 90 capsule, Rfl: 1 .  DULoxetine (CYMBALTA) 60 MG capsule, Take 60 mg by mouth daily. Takes along with 30 mg tablet (Total 90 mg), Disp: , Rfl:  .  estradiol (ESTRACE) 2 MG tablet, Take 1 tablet (2 mg total) by mouth daily., Disp: 90 tablet, Rfl: 1 .  famotidine (PEPCID) 20 MG tablet, Take 20 mg by mouth at bedtime., Disp: , Rfl:  .  HYDROmorphone (DILAUDID) 4 MG tablet, Take 1 tablet (4 mg total) by mouth every 6 (six) hours as needed for severe pain., Disp: 10 tablet, Rfl: 0 .  lipase/protease/amylase (CREON) 36000 UNITS CPEP capsule, Take 1 capsule (36,000 Units total) by mouth 3 (three) times daily as needed (with meals for digestion)., Disp: 180 capsule, Rfl: 0 .  loperamide (IMODIUM) 2 MG capsule, TAKE 1 CAPSULE BY MOUTH AS NEEDED FOR DIARRHEA OR  LOOSE  STOOLS, Disp: 90 capsule, Rfl: 0 .  methadone (DOLOPHINE) 5 MG tablet, Take 5 mg by mouth 5 (five) times daily. , Disp: , Rfl:  .  metoprolol tartrate (LOPRESSOR) 100 MG tablet, Take 1 tablet (100 mg total) by mouth  2 (two) times daily., Disp: 90 tablet, Rfl: 1 .  Multiple Vitamins-Minerals (MULTIVITAMIN ADULT PO), Take 1 tablet by mouth daily., Disp: , Rfl:  .  Omega-3 1000 MG CAPS, Take 1,000 mg by mouth daily. , Disp: , Rfl:  .  potassium chloride SA (KLOR-CON) 20 MEQ tablet, Take 40 mEq by mouth daily., Disp: , Rfl:  .  PRESCRIPTION MEDICATION, Inject 1 each into the vein daily. Home TPN . [employer] in [facility] . 1 bag for 16 hours., Disp: , Rfl:  .  Probiotic Product (PROBIOTIC-10 PO), Take 2 capsules by mouth daily. , Disp: , Rfl:  .  promethazine (PHENERGAN) 25 MG tablet, TAKE 1 TABLET BY MOUTH EVERY 6 HOURS AS NEEDED FOR NAUSEA AND VOMITING, Disp: 90 tablet, Rfl: 0 .  sucralfate (CARAFATE) 1 GM/10ML suspension, Take 10 mLs (1 g total) by mouth 4 (four) times daily -  with meals and at bedtime. (Patient taking differently: Take 1 g by mouth 4 (four) times daily. ), Disp: 420 mL, Rfl: 2 .  traZODone (DESYREL) 100 MG tablet, Take 2 tablets (200 mg total) by mouth at bedtime as needed for sleep., Disp: 60 tablet, Rfl: 0 .  vitamin B-12 (CYANOCOBALAMIN) 100 MCG tablet, Take 1,000 mcg by mouth daily. , Disp: , Rfl:  .  amLODipine (NORVASC) 10 MG tablet, Take 1 tablet (10 mg total) by mouth daily., Disp: 90 tablet, Rfl: 1 .  hydrALAZINE (APRESOLINE) 50 MG tablet, Take 1 tablet (50 mg total) by mouth 3 (three) times daily., Disp: 90 tablet, Rfl: 0  Review of Systems:  Constitutional: Denies fever, chills, diaphoresis, appetite change and fatigue.  HEENT: Denies photophobia, eye pain, redness, hearing loss, ear pain, congestion, sore throat, rhinorrhea, sneezing, mouth sores, trouble swallowing, neck pain, neck stiffness and tinnitus.   Respiratory: Denies SOB, DOE, cough, chest tightness,  and wheezing.   Cardiovascular: Denies chest pain, palpitations and leg swelling.  Gastrointestinal: Denies nausea, vomiting,  constipation, blood in stool and abdominal distention.  Genitourinary: Denies dysuria,  urgency, frequency, hematuria, flank pain and difficulty urinating.  Endocrine: Denies: hot  or cold intolerance, sweats, changes in hair or nails, polyuria, polydipsia. Musculoskeletal: Denies myalgias, back pain, joint swelling, arthralgias and gait problem.  Skin: Denies pallor, rash and wound.  Neurological: Denies dizziness, seizures, syncope, weakness, light-headedness and headaches.  Hematological: Denies adenopathy. Easy bruising, personal or family bleeding history  Psychiatric/Behavioral: Denies suicidal ideation, mood changes, confusion, nervousness, sleep disturbance and agitation    Physical Exam: Vitals:   06/23/20 1108  BP: (!) 110/64  Pulse: 61  Temp: 98.3 F (36.8 C)  TempSrc: Temporal  SpO2: 98%  Weight: 145 lb 1.6 oz (65.8 kg)    Body mass index is 22.06 kg/m.   Constitutional: NAD, calm, comfortable Eyes: PERRL, lids and conjunctivae normal, 2 verrucous lesions of her scalp right above her left ear, 1 is bleeding. ENMT: Mucous membranes are moist. Respiratory: clear to auscultation bilaterally, no wheezing, no crackles. Normal respiratory effort. No accessory muscle use.  Cardiovascular: Regular rate and rhythm, no murmurs / rubs / gallops. No extremity edema.  Abdomen: no tenderness, no masses palpated. No hepatosplenomegaly. Bowel sounds positive.  Neurologic: Grossly intact and nonfocal Psychiatric: Normal judgment and insight. Alert and oriented x 3. Normal mood.    Impression and Plan:  Essential hypertension  -Well-controlled, refill amlodipine 10 mg.  Numbness and tingling in both hands -B12, TSH was normal recently. -Not classic for carpal tunnel as tingling is on all 5 fingertips, for now I will refer to sports medicine for further evaluation, might benefit from neurology referral and maybe even use of gabapentin.  Screening for osteoporosis Postmenopausal  -She has been on steroids multiple times throughout her lifetime, it is reasonable to  order a DEXA scan today.  Verrucous papule  - Plan: Ambulatory referral to Dermatology -These lesions are unlikely to be malignant, however they bleed whenever she has a haircut and so I believe removal would be helpful.    Patient Instructions  -Nice seeing you today!!  -Referral to sports medicine today, for your hand.  -Bone density scan requested.  -Schedule follow up in 6 months.     [name], MD  Primary Care at [facility]

## 2020-06-27 ENCOUNTER — Encounter (HOSPITAL_COMMUNITY): Payer: Self-pay

## 2020-06-27 ENCOUNTER — Other Ambulatory Visit: Payer: Self-pay

## 2020-06-27 ENCOUNTER — Inpatient Hospital Stay (HOSPITAL_COMMUNITY)
Admission: EM | Admit: 2020-06-27 | Discharge: 2020-07-02 | DRG: 864 | Disposition: A | Discharge status: Home Health | Payer: Medicare Other | Attending: Internal Medicine | Admitting: Internal Medicine

## 2020-06-27 ENCOUNTER — Emergency Department (HOSPITAL_COMMUNITY): Payer: Medicare Other

## 2020-06-27 DIAGNOSIS — K50118 Crohn's disease of large intestine with other complication: Secondary | ICD-10-CM | POA: Diagnosis not present

## 2020-06-27 DIAGNOSIS — N179 Acute kidney failure, unspecified: Secondary | ICD-10-CM | POA: Diagnosis present

## 2020-06-27 DIAGNOSIS — K861 Other chronic pancreatitis: Secondary | ICD-10-CM | POA: Diagnosis present

## 2020-06-27 DIAGNOSIS — N1832 Chronic kidney disease, stage 3b: Secondary | ICD-10-CM | POA: Diagnosis present

## 2020-06-27 DIAGNOSIS — M810 Age-related osteoporosis without current pathological fracture: Secondary | ICD-10-CM | POA: Diagnosis present

## 2020-06-27 DIAGNOSIS — I10 Essential (primary) hypertension: Secondary | ICD-10-CM | POA: Diagnosis present

## 2020-06-27 DIAGNOSIS — K50818 Crohn's disease of both small and large intestine with other complication: Secondary | ICD-10-CM | POA: Diagnosis present

## 2020-06-27 DIAGNOSIS — Z79891 Long term (current) use of opiate analgesic: Secondary | ICD-10-CM

## 2020-06-27 DIAGNOSIS — G894 Chronic pain syndrome: Secondary | ICD-10-CM | POA: Diagnosis present

## 2020-06-27 DIAGNOSIS — I129 Hypertensive chronic kidney disease with stage 1 through stage 4 chronic kidney disease, or unspecified chronic kidney disease: Secondary | ICD-10-CM | POA: Diagnosis present

## 2020-06-27 DIAGNOSIS — R519 Headache, unspecified: Secondary | ICD-10-CM | POA: Diagnosis present

## 2020-06-27 DIAGNOSIS — R7401 Elevation of levels of liver transaminase levels: Secondary | ICD-10-CM | POA: Diagnosis present

## 2020-06-27 DIAGNOSIS — Z20822 Contact with and (suspected) exposure to covid-19: Secondary | ICD-10-CM | POA: Diagnosis present

## 2020-06-27 DIAGNOSIS — D631 Anemia in chronic kidney disease: Secondary | ICD-10-CM

## 2020-06-27 DIAGNOSIS — Z79890 Hormone replacement therapy (postmenopausal): Secondary | ICD-10-CM

## 2020-06-27 DIAGNOSIS — F3341 Major depressive disorder, recurrent, in partial remission: Secondary | ICD-10-CM | POA: Diagnosis not present

## 2020-06-27 DIAGNOSIS — Z881 Allergy status to other antibiotic agents status: Secondary | ICD-10-CM

## 2020-06-27 DIAGNOSIS — Z79899 Other long term (current) drug therapy: Secondary | ICD-10-CM

## 2020-06-27 DIAGNOSIS — K219 Gastro-esophageal reflux disease without esophagitis: Secondary | ICD-10-CM | POA: Diagnosis present

## 2020-06-27 DIAGNOSIS — F329 Major depressive disorder, single episode, unspecified: Secondary | ICD-10-CM | POA: Diagnosis present

## 2020-06-27 DIAGNOSIS — Z8619 Personal history of other infectious and parasitic diseases: Secondary | ICD-10-CM

## 2020-06-27 DIAGNOSIS — Z789 Other specified health status: Secondary | ICD-10-CM | POA: Diagnosis present

## 2020-06-27 DIAGNOSIS — F32A Depression, unspecified: Secondary | ICD-10-CM | POA: Diagnosis present

## 2020-06-27 DIAGNOSIS — D649 Anemia, unspecified: Secondary | ICD-10-CM | POA: Diagnosis present

## 2020-06-27 DIAGNOSIS — E538 Deficiency of other specified B group vitamins: Secondary | ICD-10-CM | POA: Diagnosis present

## 2020-06-27 DIAGNOSIS — Z885 Allergy status to narcotic agent status: Secondary | ICD-10-CM

## 2020-06-27 DIAGNOSIS — Z9049 Acquired absence of other specified parts of digestive tract: Secondary | ICD-10-CM

## 2020-06-27 DIAGNOSIS — D696 Thrombocytopenia, unspecified: Secondary | ICD-10-CM | POA: Diagnosis present

## 2020-06-27 DIAGNOSIS — Z8249 Family history of ischemic heart disease and other diseases of the circulatory system: Secondary | ICD-10-CM

## 2020-06-27 DIAGNOSIS — Z87891 Personal history of nicotine dependence: Secondary | ICD-10-CM

## 2020-06-27 DIAGNOSIS — R509 Fever, unspecified: Secondary | ICD-10-CM | POA: Diagnosis not present

## 2020-06-27 DIAGNOSIS — K5010 Crohn's disease of large intestine without complications: Secondary | ICD-10-CM | POA: Diagnosis present

## 2020-06-27 DIAGNOSIS — R6510 Systemic inflammatory response syndrome (SIRS) of non-infectious origin without acute organ dysfunction: Secondary | ICD-10-CM | POA: Diagnosis present

## 2020-06-27 DIAGNOSIS — R748 Abnormal levels of other serum enzymes: Secondary | ICD-10-CM | POA: Diagnosis present

## 2020-06-27 DIAGNOSIS — Z90710 Acquired absence of both cervix and uterus: Secondary | ICD-10-CM

## 2020-06-27 DIAGNOSIS — D509 Iron deficiency anemia, unspecified: Secondary | ICD-10-CM | POA: Diagnosis present

## 2020-06-27 DIAGNOSIS — Z888 Allergy status to other drugs, medicaments and biological substances status: Secondary | ICD-10-CM

## 2020-06-27 DIAGNOSIS — K90829 Short bowel syndrome, unspecified: Secondary | ICD-10-CM | POA: Diagnosis present

## 2020-06-27 DIAGNOSIS — R945 Abnormal results of liver function studies: Secondary | ICD-10-CM | POA: Diagnosis present

## 2020-06-27 DIAGNOSIS — J9601 Acute respiratory failure with hypoxia: Secondary | ICD-10-CM | POA: Diagnosis not present

## 2020-06-27 DIAGNOSIS — K912 Postsurgical malabsorption, not elsewhere classified: Secondary | ICD-10-CM | POA: Diagnosis present

## 2020-06-27 DIAGNOSIS — K50819 Crohn's disease of both small and large intestine with unspecified complications: Secondary | ICD-10-CM | POA: Diagnosis present

## 2020-06-27 LAB — CBC WITH DIFFERENTIAL/PLATELET
Abs Immature Granulocytes: 0.03 10*3/uL (ref 0.00–0.07)
Basophils Absolute: 0 10*3/uL (ref 0.0–0.1)
Basophils Relative: 0 %
Eosinophils Absolute: 0.1 10*3/uL (ref 0.0–0.5)
Eosinophils Relative: 1 %
HCT: 28.9 % — ABNORMAL LOW (ref 36.0–46.0)
Hemoglobin: 9.3 g/dL — ABNORMAL LOW (ref 12.0–15.0)
Immature Granulocytes: 1 %
Lymphocytes Relative: 9 %
Lymphs Abs: 0.5 10*3/uL — ABNORMAL LOW (ref 0.7–4.0)
MCH: 31.2 pg (ref 26.0–34.0)
MCHC: 32.2 g/dL (ref 30.0–36.0)
MCV: 97 fL (ref 80.0–100.0)
Monocytes Absolute: 0.1 10*3/uL (ref 0.1–1.0)
Monocytes Relative: 2 %
Neutro Abs: 5 10*3/uL (ref 1.7–7.7)
Neutrophils Relative %: 87 %
Platelets: 143 10*3/uL — ABNORMAL LOW (ref 150–400)
RBC: 2.98 MIL/uL — ABNORMAL LOW (ref 3.87–5.11)
RDW: 13.3 % (ref 11.5–15.5)
WBC: 5.7 10*3/uL (ref 4.0–10.5)
nRBC: 0 % (ref 0.0–0.2)

## 2020-06-27 LAB — COMPREHENSIVE METABOLIC PANEL
ALT: 164 U/L — ABNORMAL HIGH (ref 0–44)
AST: 216 U/L — ABNORMAL HIGH (ref 15–41)
Albumin: 3.6 g/dL (ref 3.5–5.0)
Alkaline Phosphatase: 254 U/L — ABNORMAL HIGH (ref 38–126)
Anion gap: 12 (ref 5–15)
BUN: 52 mg/dL — ABNORMAL HIGH (ref 6–20)
CO2: 28 mmol/L (ref 22–32)
Calcium: 9.1 mg/dL (ref 8.9–10.3)
Chloride: 99 mmol/L (ref 98–111)
Creatinine, Ser: 1.96 mg/dL — ABNORMAL HIGH (ref 0.44–1.00)
GFR calc Af Amer: 32 mL/min — ABNORMAL LOW (ref >60)
GFR calc non Af Amer: 27 mL/min — ABNORMAL LOW (ref >60)
Glucose, Bld: 76 mg/dL (ref 70–99)
Potassium: 3.8 mmol/L (ref 3.5–5.1)
Sodium: 139 mmol/L (ref 135–145)
Total Bilirubin: 1.2 mg/dL (ref 0.3–1.2)
Total Protein: 7.3 g/dL (ref 6.5–8.1)

## 2020-06-27 LAB — LIPASE, BLOOD: Lipase: 20 U/L (ref 11–51)

## 2020-06-27 LAB — PROCALCITONIN: Procalcitonin: 3.32 ng/mL

## 2020-06-27 LAB — LACTIC ACID, PLASMA: Lactic Acid, Venous: 0.9 mmol/L (ref 0.5–1.9)

## 2020-06-27 MED ORDER — DIPHENOXYLATE-ATROPINE 2.5-0.025 MG PO TABS
1.0000 | ORAL_TABLET | Freq: Four times a day (QID) | ORAL | Status: DC | PRN
  Administered 2020-07-01 – 2020-07-02 (×3): 1 via ORAL
  Filled 2020-06-27 (×3): qty 1

## 2020-06-27 MED ORDER — ACETAMINOPHEN 325 MG PO TABS
650.0000 mg | ORAL_TABLET | Freq: Once | ORAL | Status: AC
  Administered 2020-06-27: 650 mg via ORAL
  Filled 2020-06-27: qty 2

## 2020-06-27 MED ORDER — METOPROLOL TARTRATE 50 MG PO TABS
100.0000 mg | ORAL_TABLET | Freq: Two times a day (BID) | ORAL | Status: DC
  Administered 2020-06-27 – 2020-07-02 (×10): 100 mg via ORAL
  Filled 2020-06-27 (×4): qty 2
  Filled 2020-06-27: qty 4
  Filled 2020-06-27 (×5): qty 2

## 2020-06-27 MED ORDER — HYDROMORPHONE HCL 1 MG/ML IJ SOLN
1.0000 mg | Freq: Once | INTRAMUSCULAR | Status: AC
  Administered 2020-06-27: 1 mg via INTRAVENOUS
  Filled 2020-06-27: qty 1

## 2020-06-27 MED ORDER — PIPERACILLIN-TAZOBACTAM 3.375 G IVPB 30 MIN
3.3750 g | Freq: Once | INTRAVENOUS | Status: AC
  Administered 2020-06-27: 3.375 g via INTRAVENOUS
  Filled 2020-06-27: qty 50

## 2020-06-27 MED ORDER — ACETAMINOPHEN 325 MG PO TABS
650.0000 mg | ORAL_TABLET | Freq: Four times a day (QID) | ORAL | Status: DC | PRN
  Administered 2020-06-30 (×2): 650 mg via ORAL
  Filled 2020-06-27 (×3): qty 2

## 2020-06-27 MED ORDER — VANCOMYCIN HCL IN DEXTROSE 1-5 GM/200ML-% IV SOLN
1000.0000 mg | INTRAVENOUS | Status: DC
  Administered 2020-06-28 – 2020-06-29 (×2): 1000 mg via INTRAVENOUS
  Filled 2020-06-27: qty 200

## 2020-06-27 MED ORDER — SUCRALFATE 1 GM/10ML PO SUSP
1.0000 g | Freq: Three times a day (TID) | ORAL | Status: DC
  Administered 2020-06-27 – 2020-07-02 (×16): 1 g via ORAL
  Filled 2020-06-27 (×17): qty 10

## 2020-06-27 MED ORDER — METHADONE HCL 5 MG PO TABS
5.0000 mg | ORAL_TABLET | Freq: Every day | ORAL | Status: DC
  Administered 2020-06-27 – 2020-07-02 (×24): 5 mg via ORAL
  Filled 2020-06-27 (×23): qty 1

## 2020-06-27 MED ORDER — FENTANYL CITRATE (PF) 100 MCG/2ML IJ SOLN
50.0000 ug | Freq: Once | INTRAMUSCULAR | Status: AC
  Administered 2020-06-27: 50 ug via INTRAVENOUS
  Filled 2020-06-27: qty 2

## 2020-06-27 MED ORDER — BUPROPION HCL ER (SR) 100 MG PO TB12
200.0000 mg | ORAL_TABLET | Freq: Every day | ORAL | Status: DC
  Administered 2020-06-28 – 2020-07-02 (×5): 200 mg via ORAL
  Filled 2020-06-27 (×6): qty 2

## 2020-06-27 MED ORDER — VITAMIN B-12 1000 MCG PO TABS
1000.0000 ug | ORAL_TABLET | Freq: Every day | ORAL | Status: DC
  Administered 2020-06-28 – 2020-07-02 (×5): 1000 ug via ORAL
  Filled 2020-06-27 (×6): qty 1

## 2020-06-27 MED ORDER — BUDESONIDE 3 MG PO CPEP
9.0000 mg | ORAL_CAPSULE | Freq: Every day | ORAL | Status: DC
  Administered 2020-06-28 – 2020-07-02 (×5): 9 mg via ORAL
  Filled 2020-06-27 (×6): qty 3

## 2020-06-27 MED ORDER — POTASSIUM CHLORIDE CRYS ER 20 MEQ PO TBCR
40.0000 meq | EXTENDED_RELEASE_TABLET | Freq: Every day | ORAL | Status: DC
  Administered 2020-06-28 – 2020-07-02 (×5): 40 meq via ORAL
  Filled 2020-06-27 (×5): qty 2

## 2020-06-27 MED ORDER — AMLODIPINE BESYLATE 10 MG PO TABS
10.0000 mg | ORAL_TABLET | Freq: Every day | ORAL | Status: DC
  Administered 2020-06-28 – 2020-07-02 (×5): 10 mg via ORAL
  Filled 2020-06-27 (×5): qty 1

## 2020-06-27 MED ORDER — DIPHENHYDRAMINE HCL 50 MG/ML IJ SOLN
25.0000 mg | Freq: Once | INTRAMUSCULAR | Status: AC
  Administered 2020-06-27: 25 mg via INTRAVENOUS
  Filled 2020-06-27: qty 1

## 2020-06-27 MED ORDER — FAMOTIDINE 20 MG PO TABS
20.0000 mg | ORAL_TABLET | Freq: Every day | ORAL | Status: DC
  Administered 2020-06-27 – 2020-07-01 (×5): 20 mg via ORAL
  Filled 2020-06-27 (×5): qty 1

## 2020-06-27 MED ORDER — TRAMADOL HCL 50 MG PO TABS: 50.0000 mg | ORAL_TABLET | Freq: Four times a day (QID) | ORAL | Status: DC | PRN

## 2020-06-27 MED ORDER — SODIUM CHLORIDE 0.9 % IV SOLN: INTRAVENOUS | Status: AC

## 2020-06-27 MED ORDER — DULOXETINE HCL 60 MG PO CPEP
90.0000 mg | ORAL_CAPSULE | Freq: Every day | ORAL | Status: DC
  Administered 2020-06-28 – 2020-07-02 (×5): 90 mg via ORAL
  Filled 2020-06-27 (×5): qty 1

## 2020-06-27 MED ORDER — HYDROMORPHONE HCL 1 MG/ML IJ SOLN
0.5000 mg | INTRAMUSCULAR | Status: DC | PRN
  Administered 2020-06-28 (×4): 0.5 mg via INTRAVENOUS
  Filled 2020-06-27: qty 0.5
  Filled 2020-06-27 (×2): qty 1
  Filled 2020-06-27: qty 0.5

## 2020-06-27 MED ORDER — VANCOMYCIN HCL IN DEXTROSE 1-5 GM/200ML-% IV SOLN
1000.0000 mg | Freq: Once | INTRAVENOUS | Status: AC
  Administered 2020-06-27: 1000 mg via INTRAVENOUS
  Filled 2020-06-27: qty 200

## 2020-06-27 MED ORDER — LACTATED RINGERS IV BOLUS (SEPSIS)
1000.0000 mL | Freq: Once | INTRAVENOUS | Status: AC
  Administered 2020-06-27: 1000 mL via INTRAVENOUS

## 2020-06-27 MED ORDER — PANCRELIPASE (LIP-PROT-AMYL) 12000-38000 UNITS PO CPEP
36000.0000 [IU] | ORAL_CAPSULE | Freq: Three times a day (TID) | ORAL | Status: DC | PRN
  Administered 2020-07-01 – 2020-07-02 (×3): 36000 [IU] via ORAL
  Filled 2020-06-27: qty 1
  Filled 2020-06-27 (×3): qty 3

## 2020-06-27 MED ORDER — ONDANSETRON HCL 4 MG/2ML IJ SOLN
4.0000 mg | Freq: Once | INTRAMUSCULAR | Status: AC
  Administered 2020-06-27: 4 mg via INTRAVENOUS
  Filled 2020-06-27: qty 2

## 2020-06-27 MED ORDER — ESTRADIOL 1 MG PO TABS
2.0000 mg | ORAL_TABLET | Freq: Every day | ORAL | Status: DC
  Administered 2020-06-28 – 2020-07-02 (×5): 2 mg via ORAL
  Filled 2020-06-27 (×6): qty 2

## 2020-06-27 MED ORDER — PANTOPRAZOLE SODIUM 40 MG PO TBEC
40.0000 mg | DELAYED_RELEASE_TABLET | Freq: Every day | ORAL | Status: DC
  Administered 2020-06-28 – 2020-07-02 (×5): 40 mg via ORAL
  Filled 2020-06-27 (×5): qty 1

## 2020-06-27 MED ORDER — DICYCLOMINE HCL 20 MG PO TABS
20.0000 mg | ORAL_TABLET | Freq: Three times a day (TID) | ORAL | Status: DC
  Administered 2020-06-27 – 2020-07-02 (×15): 20 mg via ORAL
  Filled 2020-06-27 (×15): qty 1

## 2020-06-27 MED ORDER — PIPERACILLIN-TAZOBACTAM 3.375 G IVPB
3.3750 g | Freq: Three times a day (TID) | INTRAVENOUS | Status: DC
  Administered 2020-06-28 – 2020-06-30 (×7): 3.375 g via INTRAVENOUS
  Filled 2020-06-27 (×9): qty 50

## 2020-06-27 MED ORDER — HYDRALAZINE HCL 50 MG PO TABS
50.0000 mg | ORAL_TABLET | Freq: Three times a day (TID) | ORAL | Status: DC
  Administered 2020-06-27 – 2020-07-01 (×12): 50 mg via ORAL
  Filled 2020-06-27 (×11): qty 1
  Filled 2020-06-27: qty 2

## 2020-06-27 NOTE — Progress Notes (Signed)
A consult was received from an ED physician for vancomycin and zosyn per pharmacy dosing.  [name] profile has been reviewed for ht/wt/allergies/indication/available labs.   A one time order has been placed for vancomycin 1g and zosyn 3.375g.  Further antibiotics/pharmacy consults should be ordered by admitting physician if indicated.                       Thank you, Pharmacy: [phone] 06/27/2020  8:20 PM

## 2020-06-27 NOTE — H&P (Signed)
History and Physical    Caitlin Peters:[id number] DOB: [birth year] DOA: 06/27/2020  PCP: [name], [name], MD  Patient coming from: Home  I have personally briefly reviewed patient's old medical records in [place]  Chief Complaint: Fever and fatigue  HPI: Caitlin Peters is a 59 y.o. female with medical history significant for dependent since 2003, frequent PICC line infections, CKD stage III, hypertension, anemia of chronic disease and iron deficiency, GERD, depression, and chronic pain who presents to the ED for evaluation of fever.  Starting yesterday she began to feel very fatigued and tired.  Falling asleep just doing her laundry.  Then today she began to notice hot and cold chills and her daughter checked her temperature and found it to be 102F and prompted her to present to the ED since she has history of recurrent infections and is on TPN.  She denies any nausea or vomiting.  She has chronic diarrhea and goes about 5 times a day and this has remained unchanged.  Denies any bright red blood per rectum or dark stools.  However has noted more left-sided spasm abdominal pain in the last 2 days.  Denies any dysuria, increased urine frequency or urgency.  Denies any new cough, runny nose, chest pain or shortness of breath. She last had TPN this morning denies any issues with her line located on her left anterior thigh.  She continues to take some meals by mouth but has noted decrease appetite lately.  She was last hospitalized from 05/05/20-05/10/20 where presented with fever of 120F, nausea, abdominal pain and found to have pseudomonas [place] bacteremia and was kept on IV Zosyn for 3 weeks.  Her left thigh PICC line was last changed around 6/14.   ED Course: Febrile up to 100.24F and became hypoxic down to 88% following receiving IV Dilaudid for pain had to be placed on 2 L O2 via nasal cannula. No leukocytosis, hemoglobin with anemia remained stable.  Normal lactate of  0.9.  LFTs although this is chronic from TPN use.  CT abdomen pelvis with no acute findings. Review of Systems:  Constitutional: No Weight Change, + Fever ENT/Mouth: No sore throat, No Rhinorrhea Eyes: No Eye Pain, No Vision Changes Cardiovascular: No Chest Pain, no SOB Respiratory: No Cough, No Sputum Gastrointestinal: No Nausea, No Vomiting, + Diarrhea, No Constipation, + Pain Genitourinary: no Urinary Incontinence, No Urgency, No Flank Pain Musculoskeletal: No Arthralgias, No Myalgias Skin: No Skin Lesions, No Pruritus, Neuro: no Weakness, No Numbness Psych: No Anxiety/Panic, No Depression, + decrease appetite Heme/Lymph: No Bruising, No Bleeding  Past Medical History:  Diagnosis Date  . Acute pancreatitis 04/13/2020  . Anasarca 10/2019  . AVN (avascular necrosis of bone) ([place])   . Cataract   . Chronic pain syndrome   . CKD (chronic kidney disease), stage III   . Crohn disease ([place])   . Crohn disease ([place])   . Depression   . Diverticulosis   . GERD (gastroesophageal reflux disease)   . HTN (hypertension)   . IDA (iron deficiency anemia)   . Malnutrition ([place])   . Mass in chest   . Osteoporosis   . Pancreatitis   . Short gut syndrome   . Vitamin B12 deficiency     Past Surgical History:  Procedure Laterality Date  . ABDOMINAL HYSTERECTOMY    . ABDOMINAL SURGERY     colon resection with abdominal stoma, repair of intestinal leak, reversal of abdominal stoma  . BILIARY DILATION  11/26/2019   Procedure: BILIARY DILATION;  Surgeon: Jackquline Denmark, MD;  Location: [facility];  Service: Endoscopy;;  . BILIARY DILATION  03/08/2020   Procedure: BILIARY DILATION;  Surgeon: Irving Copas., MD;  Location: [facility];  Service: Gastroenterology;;  . BIOPSY  03/08/2020   Procedure: BIOPSY;  Surgeon: Irving Copas., MD;  Location: [facility];  Service: Gastroenterology;;  . CHEST WALL RESECTION     right thoracotomy,resection of chest mass with anterior rib  and reconstruction using prosthetic mesh and video arthroscopy  . CHOLECYSTECTOMY    . COLONOSCOPY  2019  . ERCP N/A 11/26/2019   Procedure: ENDOSCOPIC RETROGRADE CHOLANGIOPANCREATOGRAPHY (ERCP);  Surgeon: Jackquline Denmark, MD;  Location: [facility];  Service: Endoscopy;  Laterality: N/A;  . ERCP N/A 03/08/2020   Procedure: ENDOSCOPIC RETROGRADE CHOLANGIOPANCREATOGRAPHY (ERCP);  Surgeon: Irving Copas., MD;  Location: [facility];  Service: Gastroenterology;  Laterality: N/A;  . ESOPHAGOGASTRODUODENOSCOPY N/A 03/08/2020   Procedure: ESOPHAGOGASTRODUODENOSCOPY (EGD);  Surgeon: Irving Copas., MD;  Location: [facility];  Service: Gastroenterology;  Laterality: N/A;  . EUS N/A 03/08/2020   Procedure: UPPER ENDOSCOPIC ULTRASOUND (EUS) LINEAR;  Surgeon: Irving Copas., MD;  Location: [facility];  Service: Gastroenterology;  Laterality: N/A;  . IR FLUORO GUIDE CV LINE LEFT  01/07/2020  . IR FLUORO GUIDE CV LINE LEFT  03/09/2020  . IR FLUORO GUIDE CV LINE LEFT  05/09/2020  . IR PTA VENOUS EXCEPT DIALYSIS CIRCUIT  01/07/2020  . IR US GUIDE VASC ACCESS LEFT     x 2 06/17/19 and 09/14/2019  . KNEE SURGERY     right knee   . REMOVAL OF STONES  11/26/2019   Procedure: REMOVAL OF STONES;  Surgeon: Jackquline Denmark, MD;  Location: [facility];  Service: Endoscopy;;  . REMOVAL OF STONES  03/08/2020   Procedure: REMOVAL OF STONES;  Surgeon: Irving Copas., MD;  Location: [facility];  Service: Gastroenterology;;  . SMALL INTESTINE SURGERY     x3  . SPHINCTEROTOMY  11/26/2019   Procedure: SPHINCTEROTOMY;  Surgeon: Jackquline Denmark, MD;  Location: [facility];  Service: Endoscopy;;  . UPPER GASTROINTESTINAL ENDOSCOPY       reports that she has quit smoking. She has never used smokeless tobacco. She reports previous alcohol use. She reports that she does not use drugs. Social History  Allergies  Allergen Reactions  . Cefepime Other (See Comments)    Neurotoxicity  occurring in setting of AKI. Ceftriaxone tolerated during same admit  . Gabapentin Other (See Comments)    unknown  . Hyoscyamine Hives and Swelling    Legs swelling   Disorientation  . Lyrica [Pregabalin] Other (See Comments)    unknown  . Meperidine Hives    Other reaction(s): GI Upset Due to Chrones   . Topamax [Topiramate] Other (See Comments)    unknown  . Fentanyl Rash    Pt is allergic to fentanyl patch related to the glue (gives her a rash) Pt states she is NOT allergic to fentanyl IV medicine  . Morphine And Related Rash    Family History  Problem Relation Age of Onset  . Breast cancer Sister   . Multiple sclerosis Sister   . Diabetes Sister   . Lupus Sister   . Colon cancer Other   . Crohn's disease Other   . Seizures Mother   . Glaucoma Mother   . CAD Father   . Heart disease Father   . Hypertension Father      Prior to Admission  medications   Medication Sig Start Date End Date Taking? Authorizing Provider  acetaminophen (TYLENOL) 325 MG tablet Take 650 mg by mouth every 6 (six) hours as needed for mild pain.    Yes [provider]  amLODipine (NORVASC) 10 MG tablet Take 1 tablet (10 mg total) by mouth daily. 06/23/20 07/23/20 Yes Erline Hau, MD  budesonide (ENTOCORT EC) 3 MG 24 hr capsule TAKE 3 CAPSULES BY MOUTH ONCE DAILY Patient taking differently: Take 3 mg by mouth daily.  04/05/20  Yes [name], [name], MD  buPROPion [facility] SR) 100 MG 12 hr tablet Take 2 tablets by mouth once daily 05/24/20  Yes [name], [name], MD  calcium carbonate (TUMS - DOSED IN MG ELEMENTAL CALCIUM) 500 MG chewable tablet Chew 1 tablet by mouth 3 (three) times daily as needed for indigestion or heartburn.   Yes [provider]  cholecalciferol (VITAMIN D3) 25 MCG (1000 UT) tablet Take 1,000 Units by mouth daily.    Yes [provider]  cycloSPORINE (RESTASIS) 0.05 % ophthalmic emulsion Place 1 drop into both eyes 2  (two) times daily.   Yes [provider]  dexlansoprazole (DEXILANT) 60 MG capsule Take 1 capsule (60 mg total) by mouth daily. 10/16/19  Yes [name], [name], MD  Dextran 70-Hypromellose 0.1-0.3 % SOLN Place 1 drop into both eyes 4 (four) times daily.   Yes [provider]  dicyclomine (BENTYL) 20 MG tablet Take 20 mg by mouth 3 (three) times daily. 10/26/19  Yes [provider]  diphenoxylate-atropine (LOMOTIL) 2.5-0.025 MG tablet Take 1 tablet by mouth 4 (four) times daily as needed for diarrhea or loose stools. 05/24/20  Yes [name], [name], MD  DULoxetine (CYMBALTA) 30 MG capsule Take 3 capsules (90 mg total) by mouth daily. 05/24/20  Yes [name], [name], MD  estradiol (ESTRACE) 2 MG tablet Take 1 tablet (2 mg total) by mouth daily. 12/10/19  Yes [name], [name], MD  famotidine (PEPCID) 20 MG tablet Take 20 mg by mouth at bedtime.   Yes [provider]  hydrALAZINE (APRESOLINE) 50 MG tablet Take 50 mg by mouth 3 (three) times daily.   Yes [provider]  HYDROmorphone (DILAUDID) 4 MG tablet Take 1 tablet (4 mg total) by mouth every 6 (six) hours as needed for severe pain. 12/06/19  Yes Ripley Fraise, MD  lipase/protease/amylase (CREON) 36000 UNITS CPEP capsule Take 1 capsule (36,000 Units total) by mouth 3 (three) times daily as needed (with meals for digestion). 05/11/20  Yes [name], [name], MD  loperamide (IMODIUM) 2 MG capsule TAKE 1 CAPSULE BY MOUTH AS NEEDED FOR DIARRHEA OR  LOOSE  STOOLS Patient taking differently: Take 2 mg by mouth as needed for diarrhea or loose stools.  05/24/20  Yes Erline Hau, MD  methadone (DOLOPHINE) 5 MG tablet Take 5 mg by mouth 5 (five) times daily.  03/29/20  Yes [provider]  metoprolol tartrate (LOPRESSOR) 100 MG tablet Take 1 tablet (100 mg total) by mouth 2 (two) times daily. 03/03/20  Yes [name], [name], MD  Multiple  Vitamins-Minerals (MULTIVITAMIN ADULT PO) Take 1 tablet by mouth daily.   Yes [provider]  Omega-3 1000 MG CAPS Take 1,000 mg by mouth daily.    Yes [provider]  potassium chloride SA (KLOR-CON) 20 MEQ tablet Take 40 mEq by mouth daily.   Yes [provider]  PRESCRIPTION MEDICATION Inject 1  each into the vein daily. Home TPN . [employer] in [facility] . 1 bag for 16 hours.   Yes [provider]  Probiotic Product (PROBIOTIC-10 PO) Take 1 capsule by mouth daily.    Yes [provider]  promethazine (PHENERGAN) 25 MG tablet TAKE 1 TABLET BY MOUTH EVERY 6 HOURS AS NEEDED FOR NAUSEA AND VOMITING Patient taking differently: Take 25 mg by mouth every 6 (six) hours as needed for nausea or vomiting. TAKE 1 TABLET BY MOUTH EVERY 6 HOURS AS NEEDED FOR NAUSEA AND VOMITING 06/10/20  Yes [name], [name], MD  sucralfate (CARAFATE) 1 GM/10ML suspension Take 10 mLs (1 g total) by mouth 4 (four) times daily -  with meals and at bedtime. Patient taking differently: Take 1 g by mouth 4 (four) times daily.  12/15/19  Yes [name], [name], MD  traZODone (DESYREL) 100 MG tablet Take 2 tablets (200 mg total) by mouth at bedtime as needed for sleep. 12/31/19  Yes [name], [name], MD  vitamin B-12 (CYANOCOBALAMIN) 100 MCG tablet Take 1,000 mcg by mouth daily.    Yes [provider]  hydrALAZINE (APRESOLINE) 50 MG tablet Take 1 tablet (50 mg total) by mouth 3 (three) times daily. 05/10/20 06/09/20  Jonnie Finner, DO    Physical Exam: Vitals:   06/27/20 1944 06/27/20 2044 06/27/20 2112 06/27/20 2130  BP: (!) 152/140  127/68 111/76  Pulse: 80  95 84  Resp: 17  (!) 23 (!) 21  Temp: (!) 100.9 F (38.3 C)     TempSrc: Oral     SpO2: 99%  94% 98%  Weight:  65.8 kg    Height:  5' 8"  (1.727 m)      Constitutional: NAD, calm, comfortable, chronically ill appearing and fatigued female sitting at 40 degree incline in bed Vitals:    06/27/20 1944 06/27/20 2044 06/27/20 2112 06/27/20 2130  BP: (!) 152/140  127/68 111/76  Pulse: 80  95 84  Resp: 17  (!) 23 (!) 21  Temp: (!) 100.9 F (38.3 C)     TempSrc: Oral     SpO2: 99%  94% 98%  Weight:  65.8 kg    Height:  5' 8"  (1.727 m)     Eyes: PERRL, lids and conjunctivae normal ENMT: Mucous membranes are moist.  Neck: normal, supple Respiratory: clear to auscultation bilaterally, no wheezing, no crackles. Normal respiratory effort on 2L O2 via [place] with 97% saturation. No accessory muscle use.  Cardiovascular: Regular rate and rhythm, no murmurs / rubs / gallops. No extremity edema. Abdomen: diffuse tenderness with palpation but no rebound guarding or rigidity, no masses palpated.  Bowel sounds positive.  Musculoskeletal: no clubbing / cyanosis. No joint deformity upper and lower extremities. Good ROM, no contractures. Normal muscle tone. PICC line in place on anterior left thigh without erythema or drainage. Skin: no rashes, lesions, ulcers. No induration Neurologic: Patient appears fatigued and would drift off to sleep during evaluation.  CN 2-12 grossly intact. Sensation intact,  Strength 5/5 in all 4.  Psychiatric: Normal judgment and insight. Alert and oriented x 3. Normal pleasant mood.     Labs on Admission: I have personally reviewed following labs and imaging studies  CBC: Recent Labs  Lab 06/27/20 2000  WBC 5.7  NEUTROABS 5.0  HGB 9.3*  HCT 28.9*  MCV 97.0  PLT 185*   Basic Metabolic Panel: Recent Labs  Lab 06/27/20 2000  NA 139  K 3.8  CL 99  CO2 28  GLUCOSE 76  BUN 52*  CREATININE 1.96*  CALCIUM 9.1   GFR: Estimated Creatinine Clearance: 31.2 mL/min (A) (by C-G formula based on SCr of 1.96 mg/dL (H)). Liver Function Tests: Recent Labs  Lab 06/27/20 2000  AST 216*  ALT 164*  ALKPHOS 254*  BILITOT 1.2  PROT 7.3  ALBUMIN 3.6   Recent Labs  Lab 06/27/20 2000  LIPASE 20   No results for input(s): AMMONIA in the last 168  hours. Coagulation Profile: No results for input(s): INR, PROTIME in the last 168 hours. Cardiac Enzymes: No results for input(s): CKTOTAL, CKMB, CKMBINDEX, TROPONINI in the last 168 hours. BNP (last 3 results) No results for input(s): PROBNP in the last 8760 hours. HbA1C: No results for input(s): HGBA1C in the last 72 hours. CBG: No results for input(s): GLUCAP in the last 168 hours. Lipid Profile: No results for input(s): CHOL, HDL, LDLCALC, TRIG, CHOLHDL, LDLDIRECT in the last 72 hours. Thyroid Function Tests: No results for input(s): TSH, T4TOTAL, FREET4, T3FREE, THYROIDAB in the last 72 hours. Anemia Panel: No results for input(s): VITAMINB12, FOLATE, FERRITIN, TIBC, IRON, RETICCTPCT in the last 72 hours. Urine analysis:    Component Value Date/Time   COLORURINE YELLOW 05/05/2020 [place] 05/05/2020 1513   LABSPEC 1.016 05/05/2020 1513   PHURINE 7.0 05/05/2020 1513   GLUCOSEU NEGATIVE 05/05/2020 1513   HGBUR NEGATIVE 05/05/2020 1513   BILIRUBINUR NEGATIVE 05/05/2020 1513   KETONESUR NEGATIVE 05/05/2020 1513   PROTEINUR NEGATIVE 05/05/2020 1513   NITRITE NEGATIVE 05/05/2020 1513   LEUKOCYTESUR NEGATIVE 05/05/2020 1513    Radiological Exams on Admission: CT ABDOMEN PELVIS WO CONTRAST  Result Date: 06/27/2020 CLINICAL DATA:  Fever x1 day with abdominal pain. EXAM: CT ABDOMEN AND PELVIS WITHOUT CONTRAST TECHNIQUE: Multidetector CT imaging of the abdomen and pelvis was performed following the standard protocol without IV contrast. COMPARISON:  May 05, 2020 FINDINGS: Lower chest: No acute abnormality. Hepatobiliary: No focal liver abnormality is seen. Status post cholecystectomy. Moderate severity pneumobilia is noted. This is seen on the prior study. Pancreas: There is stable dilatation of the pancreatic duct. The pancreatic parenchyma is normal in appearance. Spleen: Normal in size without focal abnormality. Adrenals/Urinary Tract: Adrenal glands are  unremarkable. Kidneys are normal, without renal calculi, focal lesion, or hydronephrosis. Bladder is unremarkable. Stomach/Bowel: Stomach is within normal limits. The appendix is not identified. Surgically anastomosed bowel is seen within the mid and lower abdomen. No evidence of bowel dilatation. Vascular/Lymphatic: No significant vascular findings are present. The venous catheter is seen entering via the left groin. This is unchanged in position when compared to the prior exam. No enlarged abdominal or pelvic lymph nodes. Reproductive: Status post hysterectomy. No adnexal masses. Other: No abdominal wall hernia or abnormality. No abdominopelvic ascites. Musculoskeletal: A stable 1.6 cm x 0.8 cm sclerotic focus is seen adjacent to the anterolateral aspect of the left acetabulum. No acute osseous abnormalities are identified. IMPRESSION: 1. Evidence of prior cholecystectomy. 2. Stable dilatation of the pancreatic duct. 3. Stable sclerotic focus adjacent to the anterolateral aspect of the left acetabulum. This may represent a bone island. Electronically Signed   By: Virgina Norfolk M.D.   On: 06/27/2020 20:23   DG Chest Port 1 View  Result Date: 06/27/2020 CLINICAL DATA:  Fever.  Abdominal pain.  Headache. EXAM: PORTABLE CHEST 1 VIEW COMPARISON:  None. FINDINGS: Heart size is normal. Surgical clips project over the right hilum and chest. These are stable. No  edema or effusion is present. No airspace disease is evident. The visualized soft tissues and bony thorax are unremarkable. IMPRESSION: No acute cardiopulmonary disease or significant interval change. Electronically Signed   By: San Morelle M.D.   On: 06/27/2020 20:16      Assessment/Plan  Fever of unknown origin With history of bacteremia and is TPN dependent Hold TPN for tonight Continue IV vancomycin and Zosyn.  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 Domenica Fail. Keep n.p.o. for now except sips with meds Hold TPN pain control with PRN oral tramadol and IV Dilaudid 0.83m q3h only for severe pain  Continue home methadone- verified [place] substance record with pharmacy that this is current  Acute hypoxia following IV opioid Patient with some desaturation on 80% following IV Dilaudid.  Judicious use of opioids for her chronic pain. Continue to monitor O2 and wean as tolerated  Chronic kidney disease stage IIIb Stable creatinine  Transaminitis Chronic TPN use.  Stable  Chronic pancreatic duct dilatation Seen on previous CT abdomen pelvis imaging and is stable  Hypertension Continue amlodipine  Anemia of chronic disease/iron deficiency Hemoglobin stable  Depression Continue Wellbutrin and Cymbalta   Status is: Observation  The patient remains OBS appropriate and will d/c before 2 midnights.  Dispo: The patient is from: Home              Anticipated d/c is to: Home              Anticipated d/c date is: 1 day              Patient currently is not medically stable to d/c.         COrene DesanctisDO [facility]   If 7PM-7AM, please contact night-coverage [email or web address]   06/27/2020, 9:42 PM

## 2020-06-27 NOTE — Progress Notes (Signed)
Pharmacy Antibiotic Note  Caitlin Peters is a 59 y.o. female on chronic TPN with history of recurrent bacteremia admitted on 06/27/2020 with fever.  Pharmacy has been consulted for vancomycin and zosyn dosing.  Plan: Zosyn 3.375gm IV q8h (4hr extended infusions) Vancomycin 1g IV q24h Check vancomycin trough at steady state if > 5 days Follow up renal function & cultures  Height: 5' 8"  (172.7 cm) Weight: 65.8 kg (145 lb) IBW/kg (Calculated) : 63.9  Temp (24hrs), Avg:100.9 F (38.3 C), Min:100.9 F (38.3 C), Max:100.9 F (38.3 C)  Recent Labs  Lab 06/27/20 2000  WBC 5.7  CREATININE 1.96*    Estimated Creatinine Clearance: 31.2 mL/min (A) (by C-G formula based on SCr of 1.96 mg/dL (H)).    Allergies  Allergen Reactions  . Cefepime Other (See Comments)    Neurotoxicity occurring in setting of AKI. Ceftriaxone tolerated during same admit  . Gabapentin Other (See Comments)    unknown  . Hyoscyamine Hives and Swelling    Legs swelling   Disorientation  . Lyrica [Pregabalin] Other (See Comments)    unknown  . Meperidine Hives    Other reaction(s): GI Upset Due to Chrones   . Topamax [Topiramate] Other (See Comments)    unknown  . Fentanyl Rash    Pt is allergic to fentanyl patch related to the glue (gives her a rash) Pt states she is NOT allergic to fentanyl IV medicine  . Morphine And Related Rash    Antimicrobials this admission:  8/2 Vanc >> 8/2 Zosyn >>  Dose adjustments this admission:   Microbiology results:  8/2 BCx: 8/2 UCx: 8/2 COVID:  Thank you for allowing pharmacy to be a part of this patient's care.  Peggyann Juba, PharmD, BCPS Pharmacy: [phone] 06/27/2020 9:22 PM

## 2020-06-27 NOTE — ED Triage Notes (Signed)
Pt arrived via [facility] from home CC fever X 1 day 102.1, abd pain chronic, and HA. Per EMS a/OX4, stand assist  557m tylenol at 1815 today   Hx Chrons, and abd surgery, and frequent infections, tunnel line in left thigh

## 2020-06-27 NOTE — ED Notes (Signed)
Unable to give medications at this time. Consulted Pharmacy to verify.

## 2020-06-27 NOTE — ED Provider Notes (Addendum)
[facility] DEPT Provider Note   CSN: [id number] Arrival date & time: 06/27/20  1930     History Chief Complaint  Patient presents with  . Abdominal Pain  . Fever    Caitlin Peters is a 59 y.o. female.  Patient with history of Crohn's disease with chronic IV catheter for TPN.  Multiple admissions for fever and line infections in the past.  History of short gut syndrome.  Has had some abdominal pain in her usual diarrhea.  Noticed fever today.  Her IV line in her left thigh was last changed back in June.  Denies any cough or sputum production.  No urinary symptoms.  The history is provided by the patient.  Fever Temp source:  Oral Severity:  Mild Onset quality:  Gradual Timing:  Constant Progression:  Unchanged Chronicity:  New Relieved by:  Nothing Worsened by:  Nothing Associated symptoms: chills   Associated symptoms: no chest pain, no cough, no dysuria, no ear pain, no rash, no sore throat and no vomiting   Risk factors: no sick contacts        Past Medical History:  Diagnosis Date  . Acute pancreatitis 04/13/2020  . Anasarca 10/2019  . AVN (avascular necrosis of bone) ([place])   . Cataract   . Chronic pain syndrome   . CKD (chronic kidney disease), stage III   . Crohn disease ([place])   . Crohn disease ([place])   . Depression   . Diverticulosis   . GERD (gastroesophageal reflux disease)   . HTN (hypertension)   . IDA (iron deficiency anemia)   . Malnutrition ([place])   . Mass in chest   . Osteoporosis   . Pancreatitis   . Short gut syndrome   . Vitamin B12 deficiency     Patient Active Problem List   Diagnosis Date Noted  . Fever of unknown origin 06/27/2020  . Anxiety 06/03/2020  . Febrile illness, acute 05/06/2020  . Febrile illness 05/05/2020  . Acute pancreatitis 04/13/2020  . Intractable nausea and vomiting 04/13/2020  . Infection due to Stenotrophomonas maltophilia 03/10/2020  . Sepsis ([place]) 03/05/2020  . Abnormal LFTs  03/05/2020  . Pancytopenia ([place]) 03/05/2020  . Stage 3b chronic kidney disease 03/05/2020  . Bacteremia   . Infection of peripherally inserted central venous catheter (PICC)   . On total parenteral nutrition (TPN) 01/02/2020  . Elevated liver enzymes 01/02/2020  . Cholangitis   . Generalized abdominal pain   . Intractable abdominal pain 11/25/2019  . Anasarca 10/28/2019  . Acute kidney injury superimposed on chronic kidney disease ([place]) 10/28/2019  . Falls 10/28/2019  . Crohn disease ([place])   . Malnutrition ([place])   . Short gut syndrome   . HTN (hypertension)   . Vitamin B12 deficiency   . Osteoporosis   . Depression   . GERD (gastroesophageal reflux disease)   . Chronic pain syndrome   . Hypokalemia due to excessive gastrointestinal loss of potassium 10/13/2019  . Diarrhea   . Fever   . Hypokalemia 10/12/2019  . Chronic diarrhea 10/12/2019  . Dysuria 10/12/2019  . Bilateral lower extremity edema 10/12/2019  . Crohn's colitis ([place]) 10/12/2019  . Anemia 10/12/2019  . Hypertension 10/12/2019  . AKI (acute kidney injury) ([place]) 10/12/2019    Past Surgical History:  Procedure Laterality Date  . ABDOMINAL HYSTERECTOMY    . ABDOMINAL SURGERY     colon resection with abdominal stoma, repair of intestinal leak, reversal of abdominal stoma  . BILIARY DILATION  11/26/2019   Procedure: BILIARY DILATION;  Surgeon: Jackquline Denmark, MD;  Location: [facility];  Service: Endoscopy;;  . BILIARY DILATION  03/08/2020   Procedure: BILIARY DILATION;  Surgeon: Irving Copas., MD;  Location: [facility];  Service: Gastroenterology;;  . BIOPSY  03/08/2020   Procedure: BIOPSY;  Surgeon: Irving Copas., MD;  Location: [facility];  Service: Gastroenterology;;  . CHEST WALL RESECTION     right thoracotomy,resection of chest mass with anterior rib and reconstruction using prosthetic mesh and video arthroscopy  . CHOLECYSTECTOMY    . COLONOSCOPY  2019  . ERCP N/A 11/26/2019    Procedure: ENDOSCOPIC RETROGRADE CHOLANGIOPANCREATOGRAPHY (ERCP);  Surgeon: Jackquline Denmark, MD;  Location: [facility];  Service: Endoscopy;  Laterality: N/A;  . ERCP N/A 03/08/2020   Procedure: ENDOSCOPIC RETROGRADE CHOLANGIOPANCREATOGRAPHY (ERCP);  Surgeon: Irving Copas., MD;  Location: [facility];  Service: Gastroenterology;  Laterality: N/A;  . ESOPHAGOGASTRODUODENOSCOPY N/A 03/08/2020   Procedure: ESOPHAGOGASTRODUODENOSCOPY (EGD);  Surgeon: Irving Copas., MD;  Location: [facility];  Service: Gastroenterology;  Laterality: N/A;  . EUS N/A 03/08/2020   Procedure: UPPER ENDOSCOPIC ULTRASOUND (EUS) LINEAR;  Surgeon: Irving Copas., MD;  Location: [facility];  Service: Gastroenterology;  Laterality: N/A;  . IR FLUORO GUIDE CV LINE LEFT  01/07/2020  . IR FLUORO GUIDE CV LINE LEFT  03/09/2020  . IR FLUORO GUIDE CV LINE LEFT  05/09/2020  . IR PTA VENOUS EXCEPT DIALYSIS CIRCUIT  01/07/2020  . IR US GUIDE VASC ACCESS LEFT     x 2 06/17/19 and 09/14/2019  . KNEE SURGERY     right knee   . REMOVAL OF STONES  11/26/2019   Procedure: REMOVAL OF STONES;  Surgeon: Jackquline Denmark, MD;  Location: [facility];  Service: Endoscopy;;  . REMOVAL OF STONES  03/08/2020   Procedure: REMOVAL OF STONES;  Surgeon: Irving Copas., MD;  Location: [facility];  Service: Gastroenterology;;  . SMALL INTESTINE SURGERY     x3  . SPHINCTEROTOMY  11/26/2019   Procedure: SPHINCTEROTOMY;  Surgeon: Jackquline Denmark, MD;  Location: [facility];  Service: Endoscopy;;  . UPPER GASTROINTESTINAL ENDOSCOPY       OB History   No obstetric history on file.     Family History  Problem Relation Age of Onset  . Breast cancer Sister   . Multiple sclerosis Sister   . Diabetes Sister   . Lupus Sister   . Colon cancer Other   . Crohn's disease Other   . Seizures Mother   . Glaucoma Mother   . CAD Father   . Heart disease Father   . Hypertension Father     Social History   Tobacco Use    . Smoking status: Former Research scientist (life sciences)  . Smokeless tobacco: Never Used  Vaping Use  . Vaping Use: Never used  Substance Use Topics  . Alcohol use: Not Currently  . Drug use: Never    Home Medications Prior to Admission medications   Medication Sig Start Date End Date Taking? Authorizing Provider  acetaminophen (TYLENOL) 325 MG tablet Take 650 mg by mouth every 6 (six) hours as needed for mild pain.    Yes [provider]  amLODipine (NORVASC) 10 MG tablet Take 1 tablet (10 mg total) by mouth daily. 06/23/20 07/23/20 Yes Erline Hau, MD  budesonide (ENTOCORT EC) 3 MG 24 hr capsule TAKE 3 CAPSULES BY MOUTH ONCE DAILY Patient taking differently: Take 3 mg by mouth daily.  04/05/20  Yes [name],  [name], MD  buPROPion [facility] SR) 100 MG 12 hr tablet Take 2 tablets by mouth once daily 05/24/20  Yes [name], [name], MD  calcium carbonate (TUMS - DOSED IN MG ELEMENTAL CALCIUM) 500 MG chewable tablet Chew 1 tablet by mouth 3 (three) times daily as needed for indigestion or heartburn.   Yes [provider]  cholecalciferol (VITAMIN D3) 25 MCG (1000 UT) tablet Take 1,000 Units by mouth daily.    Yes [provider]  cycloSPORINE (RESTASIS) 0.05 % ophthalmic emulsion Place 1 drop into both eyes 2 (two) times daily.   Yes [provider]  dexlansoprazole (DEXILANT) 60 MG capsule Take 1 capsule (60 mg total) by mouth daily. 10/16/19  Yes [name], [name], MD  Dextran 70-Hypromellose 0.1-0.3 % SOLN Place 1 drop into both eyes 4 (four) times daily.   Yes [provider]  dicyclomine (BENTYL) 20 MG tablet Take 20 mg by mouth 3 (three) times daily. 10/26/19  Yes [provider]  diphenoxylate-atropine (LOMOTIL) 2.5-0.025 MG tablet Take 1 tablet by mouth 4 (four) times daily as needed for diarrhea or loose stools. 05/24/20  Yes [name], [name], MD  DULoxetine (CYMBALTA) 30 MG capsule Take 3 capsules (90 mg  total) by mouth daily. 05/24/20  Yes [name], [name], MD  estradiol (ESTRACE) 2 MG tablet Take 1 tablet (2 mg total) by mouth daily. 12/10/19  Yes [name], [name], MD  famotidine (PEPCID) 20 MG tablet Take 20 mg by mouth at bedtime.   Yes [provider]  hydrALAZINE (APRESOLINE) 50 MG tablet Take 50 mg by mouth 3 (three) times daily.   Yes [provider]  HYDROmorphone (DILAUDID) 4 MG tablet Take 1 tablet (4 mg total) by mouth every 6 (six) hours as needed for severe pain. 12/06/19  Yes Ripley Fraise, MD  lipase/protease/amylase (CREON) 36000 UNITS CPEP capsule Take 1 capsule (36,000 Units total) by mouth 3 (three) times daily as needed (with meals for digestion). 05/11/20  Yes [name], [name], MD  loperamide (IMODIUM) 2 MG capsule TAKE 1 CAPSULE BY MOUTH AS NEEDED FOR DIARRHEA OR  LOOSE  STOOLS Patient taking differently: Take 2 mg by mouth as needed for diarrhea or loose stools.  05/24/20  Yes Erline Hau, MD  methadone (DOLOPHINE) 5 MG tablet Take 5 mg by mouth 5 (five) times daily.  03/29/20  Yes [provider]  metoprolol tartrate (LOPRESSOR) 100 MG tablet Take 1 tablet (100 mg total) by mouth 2 (two) times daily. 03/03/20  Yes [name], [name], MD  Multiple Vitamins-Minerals (MULTIVITAMIN ADULT PO) Take 1 tablet by mouth daily.   Yes [provider]  Omega-3 1000 MG CAPS Take 1,000 mg by mouth daily.    Yes [provider]  potassium chloride SA (KLOR-CON) 20 MEQ tablet Take 40 mEq by mouth daily.   Yes [provider]  PRESCRIPTION MEDICATION Inject 1 each into the vein daily. Home TPN . [employer] in [facility] . 1 bag for 16 hours.   Yes [provider]  Probiotic Product (PROBIOTIC-10 PO) Take 1 capsule by mouth daily.    Yes [provider]  promethazine (PHENERGAN) 25 MG tablet TAKE 1 TABLET BY MOUTH EVERY 6 HOURS AS NEEDED FOR NAUSEA AND VOMITING Patient  taking differently: Take 25 mg by mouth every 6 (six) hours as needed for nausea or vomiting. TAKE 1 TABLET BY MOUTH EVERY 6 HOURS AS NEEDED FOR NAUSEA  AND VOMITING 06/10/20  Yes [name], [name], MD  sucralfate (CARAFATE) 1 GM/10ML suspension Take 10 mLs (1 g total) by mouth 4 (four) times daily -  with meals and at bedtime. Patient taking differently: Take 1 g by mouth 4 (four) times daily.  12/15/19  Yes [name], [name], MD  traZODone (DESYREL) 100 MG tablet Take 2 tablets (200 mg total) by mouth at bedtime as needed for sleep. 12/31/19  Yes [name], [name], MD  vitamin B-12 (CYANOCOBALAMIN) 100 MCG tablet Take 1,000 mcg by mouth daily.    Yes [provider]  hydrALAZINE (APRESOLINE) 50 MG tablet Take 1 tablet (50 mg total) by mouth 3 (three) times daily. 05/10/20 06/09/20  Cherylann Ratel A, DO    Allergies    Cefepime, Gabapentin, Hyoscyamine, Lyrica [pregabalin], Meperidine, Topamax [topiramate], Fentanyl, and Morphine and related  Review of Systems   Review of Systems  Constitutional: Positive for chills and fever.  HENT: Negative for ear pain and sore throat.   Eyes: Negative for pain and visual disturbance.  Respiratory: Negative for cough and shortness of breath.   Cardiovascular: Negative for chest pain and palpitations.  Gastrointestinal: Positive for abdominal pain. Negative for vomiting.  Genitourinary: Negative for dysuria and hematuria.  Musculoskeletal: Negative for arthralgias and back pain.  Skin: Negative for color change and rash.  Neurological: Negative for seizures and syncope.  All other systems reviewed and are negative.   Physical Exam Updated Vital Signs  ED Triage Vitals  Enc Vitals Group     BP 06/27/20 1944 (!) 152/140     Pulse Rate 06/27/20 1944 80     Resp 06/27/20 1944 17     Temp 06/27/20 1944 (!) 100.9 F (38.3 C)     Temp Source 06/27/20 1944 Oral     SpO2 06/27/20 1938 97 %     Weight --      Height --       Head Circumference --      Peak Flow --      Pain Score 06/27/20 1944 9     Pain Loc --      Pain Edu? --      Excl. in [place]? --     Physical Exam Vitals and nursing note reviewed.  Constitutional:      General: She is not in acute distress.    Appearance: She is well-developed.  HENT:     Head: Normocephalic and atraumatic.     Mouth/Throat:     Mouth: Mucous membranes are moist.  Eyes:     Extraocular Movements: Extraocular movements intact.     Conjunctiva/sclera: Conjunctivae normal.  Cardiovascular:     Rate and Rhythm: Normal rate and regular rhythm.     Heart sounds: Normal heart sounds. No murmur heard.   Pulmonary:     Effort: Pulmonary effort is normal. No respiratory distress.     Breath sounds: Normal breath sounds.  Abdominal:     General: Abdomen is flat. There is no distension.     Palpations: Abdomen is soft.     Tenderness: There is abdominal tenderness in the right lower quadrant, suprapubic area and left lower quadrant.  Musculoskeletal:     Cervical back: Neck supple.  Skin:    General: Skin is warm and dry.     Comments: Central catheter line in the left thigh without any signs of erythema  Neurological:     Mental Status: She is alert.  ED Results / Procedures / Treatments   Labs (all labs ordered are listed, but only abnormal results are displayed) Labs Reviewed  COMPREHENSIVE METABOLIC PANEL - Abnormal; Notable for the following components:      Result Value   BUN 52 (*)    Creatinine, Ser 1.96 (*)    AST 216 (*)    ALT 164 (*)    Alkaline Phosphatase 254 (*)    GFR calc non Af Amer 27 (*)    GFR calc Af Amer 32 (*)    All other components within normal limits  CBC WITH DIFFERENTIAL/PLATELET - Abnormal; Notable for the following components:   RBC 2.98 (*)    Hemoglobin 9.3 (*)    HCT 28.9 (*)    Platelets 143 (*)    Lymphs Abs 0.5 (*)    All other components within normal limits  URINE CULTURE  CULTURE, BLOOD (ROUTINE X 2)    CULTURE, BLOOD (ROUTINE X 2)  SARS CORONAVIRUS 2 BY RT PCR (HOSPITAL ORDER, [facility])  LIPASE, BLOOD  LACTIC ACID, PLASMA  LACTIC ACID, PLASMA  URINALYSIS, ROUTINE W REFLEX MICROSCOPIC  PROTIME-INR  APTT  CBC  COMPREHENSIVE METABOLIC PANEL    EKG EKG Interpretation  Date/Time:  Monday June 27 2020 21:17:30 EDT Ventricular Rate:  89 PR Interval:    QRS Duration: 89 QT Interval:  393 QTC Calculation: 479 R Axis:   64 Text Interpretation: Sinus rhythm Abnormal R-wave progression, early transition Confirmed by Lennice Sites [phone]) on 06/27/2020 9:19:53 PM   Radiology CT ABDOMEN PELVIS WO CONTRAST  Result Date: 06/27/2020 CLINICAL DATA:  Fever x1 day with abdominal pain. EXAM: CT ABDOMEN AND PELVIS WITHOUT CONTRAST TECHNIQUE: Multidetector CT imaging of the abdomen and pelvis was performed following the standard protocol without IV contrast. COMPARISON:  May 05, 2020 FINDINGS: Lower chest: No acute abnormality. Hepatobiliary: No focal liver abnormality is seen. Status post cholecystectomy. Moderate severity pneumobilia is noted. This is seen on the prior study. Pancreas: There is stable dilatation of the pancreatic duct. The pancreatic parenchyma is normal in appearance. Spleen: Normal in size without focal abnormality. Adrenals/Urinary Tract: Adrenal glands are unremarkable. Kidneys are normal, without renal calculi, focal lesion, or hydronephrosis. Bladder is unremarkable. Stomach/Bowel: Stomach is within normal limits. The appendix is not identified. Surgically anastomosed bowel is seen within the mid and lower abdomen. No evidence of bowel dilatation. Vascular/Lymphatic: No significant vascular findings are present. The venous catheter is seen entering via the left groin. This is unchanged in position when compared to the prior exam. No enlarged abdominal or pelvic lymph nodes. Reproductive: Status post hysterectomy. No adnexal masses. Other: No abdominal  wall hernia or abnormality. No abdominopelvic ascites. Musculoskeletal: A stable 1.6 cm x 0.8 cm sclerotic focus is seen adjacent to the anterolateral aspect of the left acetabulum. No acute osseous abnormalities are identified. IMPRESSION: 1. Evidence of prior cholecystectomy. 2. Stable dilatation of the pancreatic duct. 3. Stable sclerotic focus adjacent to the anterolateral aspect of the left acetabulum. This may represent a bone island. Electronically Signed   By: Virgina Norfolk M.D.   On: 06/27/2020 20:23   DG Chest Port 1 View  Result Date: 06/27/2020 CLINICAL DATA:  Fever.  Abdominal pain.  Headache. EXAM: PORTABLE CHEST 1 VIEW COMPARISON:  None. FINDINGS: Heart size is normal. Surgical clips project over the right hilum and chest. These are stable. No edema or effusion is present. No airspace disease is evident. The visualized soft tissues and  bony thorax are unremarkable. IMPRESSION: No acute cardiopulmonary disease or significant interval change. Electronically Signed   By: San Morelle M.D.   On: 06/27/2020 20:16    Procedures .Critical Care Performed by: Lennice Sites, DO Authorized by: Lennice Sites, DO   Critical care provider statement:    Critical care time (minutes):  40   Critical care was necessary to treat or prevent imminent or life-threatening deterioration of the following conditions:  Sepsis   Critical care was time spent personally by me on the following activities:  Blood draw for specimens, development of treatment plan with patient or surrogate, discussions with primary provider, evaluation of patient's response to treatment, examination of patient, obtaining history from patient or surrogate, ordering and performing treatments and interventions, ordering and review of laboratory studies, pulse oximetry, ordering and review of radiographic studies, re-evaluation of patient's condition and review of old charts   I assumed direction of critical care for this  patient from another provider in my specialty: no     (including critical care time)  Medications Ordered in ED Medications  vancomycin (VANCOCIN) IVPB 1000 mg/200 mL premix (has no administration in time range)  acetaminophen (TYLENOL) tablet 650 mg (has no administration in time range)  lactated ringers bolus 1,000 mL (1,000 mLs Intravenous New Bag/Given 06/27/20 2044)  fentaNYL (SUBLIMAZE) injection 50 mcg (50 mcg Intravenous Given 06/27/20 2048)  ondansetron (ZOFRAN) injection 4 mg (4 mg Intravenous Given 06/27/20 2045)  acetaminophen (TYLENOL) tablet 650 mg (650 mg Oral Given 06/27/20 2048)  piperacillin-tazobactam (ZOSYN) IVPB 3.375 g (0 g Intravenous Stopped 06/27/20 2117)  diphenhydrAMINE (BENADRYL) injection 25 mg (25 mg Intravenous Given 06/27/20 2109)  HYDROmorphone (DILAUDID) injection 1 mg (1 mg Intravenous Given 06/27/20 2111)    ED Course  I have reviewed the triage vital signs and the nursing notes.  Pertinent labs & imaging results that were available during my care of the patient were reviewed by me and considered in my medical decision making (see chart for details).    MDM Rules/Calculators/A&P                          Caitlin Peters is a 59 year old female history of CKD, short gut syndrome, Crohn's status post multiple surgeries on chronic TPN who presents to the ED with fever, abdominal pain.  Patient febrile but otherwise unremarkable vitals.  Fever started today.  Has had multiple PICC line infections in the past and bacteremia.  Most recently in June.  No other Covid type symptoms or respiratory symptoms or GU symptoms.  Sepsis labs have been ordered and empirically will start patient on vancomycin and Zosyn.  Will get a CT scan of the abdomen and pelvis to evaluate for any intra-abdominal infection although she does have some aspect of chronic abdominal pain.  Anticipate admission given fever with PICC line.  PICC line currently in the left thigh with no surrounding erythema  or drainage.  Chest x-ray without any signs of infection.  CT scan of abdomen and pelvis overall with no acute findings.  No colitis or abscesses.  No significant leukocytosis, anemia, electrolyte abnormality.  Creatinine at baseline.  Liver enzymes mildly elevated.  Have been elevated in the past.  Given her history of bacteremia from line infections and as well as patient being on TPN she is high risk for bacteremia.  Will admit for further antibiotics and to await blood cultures.  Hemodynamically stable to my care.  This chart  was dictated using voice recognition software.  Despite best efforts to proofread,  errors can occur which can change the documentation meaning.    Final Clinical Impression(s) / ED Diagnoses Final diagnoses:  Fever of unknown origin    Rx / DC Orders ED Discharge Orders    None       Lennice Sites, DO 06/27/20 2055    Lennice Sites, DO 06/27/20 2120

## 2020-06-28 ENCOUNTER — Other Ambulatory Visit: Payer: Self-pay | Admitting: *Deleted

## 2020-06-28 DIAGNOSIS — K219 Gastro-esophageal reflux disease without esophagitis: Secondary | ICD-10-CM | POA: Diagnosis present

## 2020-06-28 DIAGNOSIS — F3341 Major depressive disorder, recurrent, in partial remission: Secondary | ICD-10-CM | POA: Diagnosis not present

## 2020-06-28 DIAGNOSIS — K5010 Crohn's disease of large intestine without complications: Secondary | ICD-10-CM | POA: Diagnosis present

## 2020-06-28 DIAGNOSIS — D631 Anemia in chronic kidney disease: Secondary | ICD-10-CM | POA: Diagnosis present

## 2020-06-28 DIAGNOSIS — R6510 Systemic inflammatory response syndrome (SIRS) of non-infectious origin without acute organ dysfunction: Secondary | ICD-10-CM | POA: Diagnosis present

## 2020-06-28 DIAGNOSIS — I129 Hypertensive chronic kidney disease with stage 1 through stage 4 chronic kidney disease, or unspecified chronic kidney disease: Secondary | ICD-10-CM | POA: Diagnosis present

## 2020-06-28 DIAGNOSIS — Z9049 Acquired absence of other specified parts of digestive tract: Secondary | ICD-10-CM | POA: Diagnosis not present

## 2020-06-28 DIAGNOSIS — Z20822 Contact with and (suspected) exposure to covid-19: Secondary | ICD-10-CM | POA: Diagnosis present

## 2020-06-28 DIAGNOSIS — R509 Fever, unspecified: Secondary | ICD-10-CM | POA: Diagnosis present

## 2020-06-28 DIAGNOSIS — R748 Abnormal levels of other serum enzymes: Secondary | ICD-10-CM | POA: Diagnosis present

## 2020-06-28 DIAGNOSIS — J9601 Acute respiratory failure with hypoxia: Secondary | ICD-10-CM | POA: Diagnosis not present

## 2020-06-28 DIAGNOSIS — N179 Acute kidney failure, unspecified: Secondary | ICD-10-CM | POA: Diagnosis present

## 2020-06-28 DIAGNOSIS — D509 Iron deficiency anemia, unspecified: Secondary | ICD-10-CM | POA: Diagnosis present

## 2020-06-28 DIAGNOSIS — R7401 Elevation of levels of liver transaminase levels: Secondary | ICD-10-CM | POA: Diagnosis present

## 2020-06-28 DIAGNOSIS — A419 Sepsis, unspecified organism: Secondary | ICD-10-CM

## 2020-06-28 DIAGNOSIS — Z90710 Acquired absence of both cervix and uterus: Secondary | ICD-10-CM | POA: Diagnosis not present

## 2020-06-28 DIAGNOSIS — D696 Thrombocytopenia, unspecified: Secondary | ICD-10-CM | POA: Diagnosis present

## 2020-06-28 DIAGNOSIS — Z789 Other specified health status: Secondary | ICD-10-CM | POA: Diagnosis not present

## 2020-06-28 DIAGNOSIS — F329 Major depressive disorder, single episode, unspecified: Secondary | ICD-10-CM | POA: Diagnosis present

## 2020-06-28 DIAGNOSIS — K50118 Crohn's disease of large intestine with other complication: Secondary | ICD-10-CM | POA: Diagnosis not present

## 2020-06-28 DIAGNOSIS — K861 Other chronic pancreatitis: Secondary | ICD-10-CM | POA: Diagnosis present

## 2020-06-28 DIAGNOSIS — G894 Chronic pain syndrome: Secondary | ICD-10-CM | POA: Diagnosis present

## 2020-06-28 DIAGNOSIS — N1832 Chronic kidney disease, stage 3b: Secondary | ICD-10-CM | POA: Diagnosis present

## 2020-06-28 DIAGNOSIS — Z8619 Personal history of other infectious and parasitic diseases: Secondary | ICD-10-CM | POA: Diagnosis not present

## 2020-06-28 DIAGNOSIS — K912 Postsurgical malabsorption, not elsewhere classified: Secondary | ICD-10-CM | POA: Diagnosis present

## 2020-06-28 DIAGNOSIS — R945 Abnormal results of liver function studies: Secondary | ICD-10-CM | POA: Diagnosis present

## 2020-06-28 LAB — URINALYSIS, ROUTINE W REFLEX MICROSCOPIC
Bilirubin Urine: NEGATIVE
Glucose, UA: NEGATIVE mg/dL
Hgb urine dipstick: NEGATIVE
Ketones, ur: NEGATIVE mg/dL
Leukocytes,Ua: NEGATIVE
Nitrite: NEGATIVE
Protein, ur: NEGATIVE mg/dL
Specific Gravity, Urine: 1.011 (ref 1.005–1.030)
pH: 8 (ref 5.0–8.0)

## 2020-06-28 LAB — COMPREHENSIVE METABOLIC PANEL
ALT: 150 U/L — ABNORMAL HIGH (ref 0–44)
AST: 131 U/L — ABNORMAL HIGH (ref 15–41)
Albumin: 3 g/dL — ABNORMAL LOW (ref 3.5–5.0)
Alkaline Phosphatase: 205 U/L — ABNORMAL HIGH (ref 38–126)
Anion gap: 8 (ref 5–15)
BUN: 48 mg/dL — ABNORMAL HIGH (ref 6–20)
CO2: 30 mmol/L (ref 22–32)
Calcium: 8.9 mg/dL (ref 8.9–10.3)
Chloride: 100 mmol/L (ref 98–111)
Creatinine, Ser: 1.93 mg/dL — ABNORMAL HIGH (ref 0.44–1.00)
GFR calc Af Amer: 32 mL/min — ABNORMAL LOW (ref >60)
GFR calc non Af Amer: 28 mL/min — ABNORMAL LOW (ref >60)
Glucose, Bld: 90 mg/dL (ref 70–99)
Potassium: 4.3 mmol/L (ref 3.5–5.1)
Sodium: 138 mmol/L (ref 135–145)
Total Bilirubin: 1.2 mg/dL (ref 0.3–1.2)
Total Protein: 6.4 g/dL — ABNORMAL LOW (ref 6.5–8.1)

## 2020-06-28 LAB — CBC
HCT: 25.5 % — ABNORMAL LOW (ref 36.0–46.0)
Hemoglobin: 8.3 g/dL — ABNORMAL LOW (ref 12.0–15.0)
MCH: 31.3 pg (ref 26.0–34.0)
MCHC: 32.5 g/dL (ref 30.0–36.0)
MCV: 96.2 fL (ref 80.0–100.0)
Platelets: 116 10*3/uL — ABNORMAL LOW (ref 150–400)
RBC: 2.65 MIL/uL — ABNORMAL LOW (ref 3.87–5.11)
RDW: 13.5 % (ref 11.5–15.5)
WBC: 10.7 10*3/uL — ABNORMAL HIGH (ref 4.0–10.5)
nRBC: 0 % (ref 0.0–0.2)

## 2020-06-28 LAB — PROTIME-INR
INR: 1 (ref 0.8–1.2)
Prothrombin Time: 13.2 seconds (ref 11.4–15.2)

## 2020-06-28 LAB — PROCALCITONIN: Procalcitonin: 16.52 ng/mL

## 2020-06-28 LAB — SARS CORONAVIRUS 2 BY RT PCR (HOSPITAL ORDER, PERFORMED IN ~~LOC~~ HOSPITAL LAB): SARS Coronavirus 2: NEGATIVE

## 2020-06-28 LAB — APTT: aPTT: 28 seconds (ref 24–36)

## 2020-06-28 MED ORDER — TRAZODONE HCL 100 MG PO TABS: 200.0000 mg | ORAL_TABLET | Freq: Every evening | ORAL | 0 refills | Status: DC | PRN

## 2020-06-28 MED ORDER — PROMETHAZINE HCL 25 MG PO TABS: 25.0000 mg | ORAL_TABLET | Freq: Four times a day (QID) | ORAL | 0 refills | Status: DC | PRN

## 2020-06-28 MED ORDER — ESTRADIOL 2 MG PO TABS: 2.0000 mg | ORAL_TABLET | Freq: Every day | ORAL | 1 refills | Status: DC

## 2020-06-28 MED ORDER — BUTALBITAL-APAP-CAFFEINE 50-325-40 MG PO TABS
1.0000 | ORAL_TABLET | ORAL | Status: DC | PRN
  Administered 2020-06-28: 1 via ORAL
  Filled 2020-06-28: qty 1

## 2020-06-28 MED ORDER — DEXILANT 60 MG PO CPDR: 60.0000 mg | DELAYED_RELEASE_CAPSULE | Freq: Every day | ORAL | 1 refills | Status: DC

## 2020-06-28 MED ORDER — PANCRELIPASE (LIP-PROT-AMYL) 36000-114000 UNITS PO CPEP: 36000.0000 [IU] | ORAL_CAPSULE | Freq: Three times a day (TID) | ORAL | 0 refills | Status: DC | PRN

## 2020-06-28 MED ORDER — DIPHENHYDRAMINE HCL 50 MG/ML IJ SOLN
25.0000 mg | Freq: Once | INTRAMUSCULAR | Status: AC
  Administered 2020-06-28: 25 mg via INTRAVENOUS
  Filled 2020-06-28: qty 1

## 2020-06-28 MED ORDER — SUCRALFATE 1 GM/10ML PO SUSP: 1.0000 g | Freq: Three times a day (TID) | ORAL | 2 refills | Status: DC

## 2020-06-28 MED ORDER — SODIUM CHLORIDE 0.9 % IV SOLN: INTRAVENOUS | Status: DC | PRN

## 2020-06-28 MED ORDER — BUPROPION HCL ER (SR) 100 MG PO TB12: 200.0000 mg | ORAL_TABLET | Freq: Every day | ORAL | 0 refills | Status: DC

## 2020-06-28 MED ORDER — HYDRALAZINE HCL 50 MG PO TABS: 50.0000 mg | ORAL_TABLET | Freq: Three times a day (TID) | ORAL | 2 refills | Status: DC

## 2020-06-28 MED ORDER — POTASSIUM CHLORIDE CRYS ER 20 MEQ PO TBCR: 40.0000 meq | EXTENDED_RELEASE_TABLET | Freq: Every day | ORAL | 0 refills | Status: DC

## 2020-06-28 MED ORDER — METOPROLOL TARTRATE 100 MG PO TABS: 100.0000 mg | ORAL_TABLET | Freq: Two times a day (BID) | ORAL | 1 refills | Status: DC

## 2020-06-28 MED ORDER — ONDANSETRON 4 MG PO TBDP: 4.0000 mg | ORAL_TABLET | Freq: Three times a day (TID) | ORAL | Status: DC | PRN

## 2020-06-28 MED ORDER — ONDANSETRON HCL 4 MG/2ML IJ SOLN
4.0000 mg | Freq: Three times a day (TID) | INTRAMUSCULAR | Status: DC | PRN
  Administered 2020-06-28 – 2020-07-02 (×9): 4 mg via INTRAVENOUS
  Filled 2020-06-28 (×9): qty 2

## 2020-06-28 MED ORDER — HYDROMORPHONE HCL 1 MG/ML IJ SOLN
1.0000 mg | INTRAMUSCULAR | Status: DC | PRN
  Administered 2020-06-28 – 2020-07-02 (×26): 1 mg via INTRAVENOUS
  Filled 2020-06-28 (×27): qty 1

## 2020-06-28 NOTE — ED Notes (Signed)
ED bladder scanner currently not working.

## 2020-06-28 NOTE — Telephone Encounter (Signed)
-----   Message from Germaine Pomfret, [facility] sent at 06/28/2020  1:29 PM EDT ----- Regarding: Transfer of Medications to [name],  The patient wishes to use [facility] going forward. To help [place] transition the patient to the pharmacy, can you please put in new refills of her chronic medications so that we may fill them for her the next time they are due?   She needs refills for:  Bupropion SR 100 mg  Dexlansoprazole 60 mg  Estradiol 2 mg  Hydralazine 50 mg  Creon 36,000 units Loperamide 2 mg  Metoprolol Tartrate 100 mg  Potassium Chloride 20 mEq  Promethazine 25 mg  Sucralfate 1g/47m Susp.  Trazodone 100 mg   Thanks, ADoristine SectionClinical Pharmacist [facility]

## 2020-06-28 NOTE — Progress Notes (Signed)
Initial Nutrition Assessment  INTERVENTION:   -Recommend resuming TPN now that pt is admitted  NUTRITION DIAGNOSIS:   Increased nutrient needs related to chronic illness, altered GI function (SGS) as evidenced by estimated needs.  GOAL:   Patient will meet greater than or equal to 90% of their needs  MONITOR:   Labs, Weight trends, I & O's (TPN)  REASON FOR ASSESSMENT:   Malnutrition Screening Tool    ASSESSMENT:   59 y.o. female with medical history significant for dependent since 2003, frequent PICC line infections, CKD stage III, hypertension, anemia of chronic disease and iron deficiency, GERD, depression, and chronic pain who presents to the ED for evaluation of fever.  RD familiar with pt from previous admissions this year. Pt on chronic TPN at home r/t short gut syndrome. Pt has chronic diarrhea. Pt consumes PO for comfort. Does not drink protein supplements at home. Currently NPO. Receiving vitamin/mineral supplements PO.   Recommend resuming TPN now that pt is admitted.  No weight changes since last admission.  Labs reviewed. Medications: KLOR-CON, Carafate, Vitamin B-12, IV Zofran  NUTRITION - FOCUSED PHYSICAL EXAM:  Will attempt at follow-up  Diet Order:   Diet Order            Diet NPO time specified Except for: Sips with Meds  Diet effective now                 EDUCATION NEEDS:   No education needs have been identified at this time  Skin:  Skin Assessment: Reviewed RN Assessment  Last BM:  8/3 -type 7  Height:   Ht Readings from Last 1 Encounters:  06/27/20 5' 8"  (1.727 m)    Weight:   Wt Readings from Last 1 Encounters:  06/27/20 65.8 kg   BMI:  Body mass index is 22.05 kg/m.  Estimated Nutritional Needs:   Kcal:  2000-2200  Protein:  100-115g  Fluid:  2L/day  Clayton Bibles, MS, RD, LDN Inpatient Clinical Dietitian Contact information available via Amion

## 2020-06-28 NOTE — ED Notes (Signed)
Bladder scan showed 775 mL

## 2020-06-28 NOTE — Progress Notes (Addendum)
PROGRESS NOTE    Caitlin Peters  VHQ:[id number] DOB: [birth year] DOA: 06/27/2020 PCP: [name], [name], MD   Brief Narrative:  HPI per Dr. Ileene Musa on 06/27/20 Caitlin Peters is a 59 y.o. female with medical history significant for dependent since 2003, frequent PICC line infections, CKD stage III, hypertension, anemia of chronic disease and iron deficiency, GERD, depression, and chronic pain who presents to the ED for evaluation of fever.  Starting yesterday she began to feel very fatigued and tired.  Falling asleep just doing her laundry.  Then today she began to notice hot and cold chills and her daughter checked her temperature and found it to be 102F and prompted her to present to the ED since she has history of recurrent infections and is on TPN.  She denies any nausea or vomiting.  She has chronic diarrhea and goes about 5 times a day and this has remained unchanged.  Denies any bright red blood per rectum or dark stools.  However has noted more left-sided spasm abdominal pain in the last 2 days.  Denies any dysuria, increased urine frequency or urgency.  Denies any new cough, runny nose, chest pain or shortness of breath. She last had TPN this morning denies any issues with her line located on her left anterior thigh.  She continues to take some meals by mouth but has noted decrease appetite lately.  She was last hospitalized from 05/05/20-05/10/20 where presented with fever of 120F, nausea, abdominal pain and found to have pseudomonas [place] bacteremia and was kept on IV Zosyn for 3 weeks.  Her left thigh PICC line was last changed around 6/14.   ED Course: Febrile up to 100.16F and became hypoxic down to 88% following receiving IV Dilaudid for pain had to be placed on 2 L O2 via nasal cannula. No leukocytosis, hemoglobin with anemia remained stable.  Normal lactate of 0.9.  LFTs although this is chronic from TPN use.  **Interim History She continues to have abdominal pain and  pain with her diarrhea and infectious diseases was consulted for further evaluation recommendations and Dr. Maudie Mercury will see the patient in the morning.  We will continue IV vancomycin and IV Zosyn for now given that she has had a history of Pseudomonas Pituda bacteremia.  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 Domenica Fail. -Keep n.p.o. for now except sips with meds for now and may need to discuss with GI -Continue with budesonide 9 mg p.o. daily -Hold TPN pain control with PRN oral tramadol and IV Dilaudid 0.10m q3h only for severe pain; Dilaudid was increased to 1 mg every 3 hours given her continued pain    -Also continue with her Creon -Continue home methadone- verified [place] substance record with pharmacy that this is current  Acute respiratory failure with hypoxia following IV opiate administration -Patient with some desaturation on 80% following IV Dilaudid.   -Judicious use of opioids for her chronic pain.  I have upped her Dilaudid from 1.5 mg to 1 mg as her pain was not being adequately controlled but will not further escalate -Continue to monitor O2 and wean as tolerated  Chronic kidney disease stage IIIb -Stable creatinine. -Patient's BUNs/creatinine went from 52/1.96 is now 48/1.93 -Avoid nephrotoxic medications, contrast dyes, hypotension and renally adjust medications X-repeat CMP in a.m.  Transaminitis/Abnormal LFTs -Secondary to chronic TPN use.  -Patient's LFTs are actually improving and AST went from 216 and is now 131 and ALT went from 160s worse to 150 -Continue monitor and trend hepatic function panel-if necessary will obtain an right upper quadrant ultrasound as well as an acute hepatitis panel -Repeat CMP in a.m.  Chronic pancreatic duct dilatation -Seen on previous CT abdomen pelvis imaging and is stable -Continue with Creon 36,000 units p.o. 3 times daily as needed for meals for digestion  Hypertension -Continue Amlodipine 10 mg p.o. daily metoprolol tartrate 100 mg p.o. twice daily as well as hydralazine 50 mg p.o. 3 times daily -Patient blood pressure is remaining stable at 121/67 -Continue monitor blood pressures per protocol  Anemia of chronic disease/iron deficiency -Patient's hemoglobin/hematocrit went from 9.3/20.9 is now 8.3/25.5 and likely a dilutional drop in the setting of her IV fluid resuscitation  -Check anemia panel in a.m. -Continue to monitor for signs and symptoms of bleeding; currently no overt bleeding noted -Repeat CBC in a.m.  Depression -Continue Wellbutrin and Duloxetine 90 mg p.o. daily  Thrombocytopenia -Patient's platelet  count went from 143,000 is now 116,000 -Continue to monitor for signs and symptoms bleeding; currently no overt bleeding noted -Repeat CBC in a.m.  Headache -Not amenable to acetaminophen -We will try Fioricet 1 tab every 4 hours as needed for headache  GERD -Continue with Pantoprazole and famotidine  DVT prophylaxis: SCDs Code Status: FULL CODE  -Family Communication: No family present at bedside  Disposition Plan: Pending further clinical improvement and evaluation of her fever.  ID has been consulted and will need to make sure that we have a safe discharge disposition for the patient and the patient can be resumed on TPN given her short gut syndrome  Status is: Inpatient  Remains inpatient appropriate because:Ongoing diagnostic testing needed not appropriate for outpatient work up, Unsafe d/c plan, IV treatments appropriate due to intensity of illness or inability to take PO and Inpatient level of care appropriate due to severity of illness   Dispo: The patient is from: Home              Anticipated d/c is to: TBD              Anticipated d/c date is: 2 days              Patient currently  is not medically stable to d/c.  Consultants:   Infectious Diseases   Procedures: None  Antimicrobials:  Anti-infectives (From admission, onward)   Start     Dose/Rate Route Frequency Ordered Stop   06/28/20 2200  vancomycin (VANCOCIN) IVPB 1000 mg/200 mL premix     Discontinue     1,000 mg 200 mL/hr over 60 Minutes Intravenous Every 24 hours 06/27/20 2127     06/28/20 0400  piperacillin-tazobactam (ZOSYN) IVPB 3.375 g     Discontinue     3.375 g 12.5 mL/hr over 240 Minutes Intravenous Every 8 hours 06/27/20 2127     06/27/20 2030  piperacillin-tazobactam (ZOSYN) IVPB 3.375 g        3.375 g 100 mL/hr over 30 Minutes Intravenous  Once 06/27/20 2018 06/27/20 2117   06/27/20 2030  vancomycin (VANCOCIN) IVPB 1000 mg/200 mL premix        1,000 mg 200 mL/hr over 60 Minutes Intravenous   Once 06/27/20 2018 06/27/20 2236     Subjective: And examined at bedside and states that her head was still hurting and she is also having abdominal pain and rectal pain.  No nausea or vomiting.  Continues to have significant amount of diarrhea.  Feels a little bit better today but complained of a headache.  Have asked ID to evaluate as she currently sees Dr. Drucilla Schmidt in clinic.  No other concerns or complaints at this time and feels okay.  Objective: Vitals:   06/28/20 0933 06/28/20 1120 06/28/20 1505 06/28/20 1821  BP: (!) 130/57 140/71 130/83 121/67  Pulse: (!) 57 79 (!) 57 (!) 59  Resp: 16 18 17 16   Temp: 98.9 F (37.2 C) 99.2 F (37.3 C) 98.8 F (37.1 C) 98.1 F (36.7 C)  TempSrc: Oral Oral Oral Oral  SpO2: 96% 97% 95% 96%  Weight:      Height:        Intake/Output Summary (Last 24 hours) at 06/28/2020 1831 Last data filed at 06/28/2020 1548 Gross per 24 hour  Intake 2840.93 ml  Output 1300 ml  Net 1540.93 ml   Filed Weights   06/27/20 2044  Weight: 65.8 kg   Examination: Physical Exam:  Constitutional: Thin chronically ill-appearing African-American female currently in NAD and appears calm but slightly uncomfortable Eyes: Lids and conjunctivae normal, sclerae anicteric  ENMT: External Ears, Nose appear normal. Grossly normal hearing.  Neck: Appears normal, supple, no cervical masses, normal ROM, no appreciable thyromegaly; no JVD Respiratory: Diminished to auscultation bilaterally with unlabored breathing, no wheezing, rales, rhonchi or crackles. Normal respiratory effort and patient is not tachypenic. No accessory muscle use.  Cardiovascular: RRR, no murmurs / rubs / gallops. S1 and S2 auscultated.  Abdomen: Soft, tender to palpate, non-distended. No masses palpated. No appreciable hepatosplenomegaly. Bowel sounds positive.  GU: Deferred. Musculoskeletal: No clubbing / cyanosis of digits/nails. No joint deformity upper and lower extremities.  Has a PICC line in the left  thigh Skin: No rashes, lesions, ulcers on limited skin evaluation. No induration; Warm and dry.  Neurologic: CN 2-12 grossly intact with no focal deficits. Romberg sign cerebellar reflexes not assessed.  Psychiatric: Normal judgment and insight. Alert and oriented x 3. Normal mood and appropriate affect.   Data Reviewed: I have personally reviewed following labs and imaging studies  CBC: Recent Labs  Lab 06/27/20 2000 06/28/20 0450  WBC 5.7 10.7*  NEUTROABS 5.0  --   HGB 9.3* 8.3*  HCT 28.9* 25.5*  MCV 97.0 96.2  PLT 143* 116*  Basic Metabolic Panel: Recent Labs  Lab 06/27/20 2000 06/28/20 0450  NA 139 138  K 3.8 4.3  CL 99 100  CO2 28 30  GLUCOSE 76 90  BUN 52* 48*  CREATININE 1.96* 1.93*  CALCIUM 9.1 8.9   GFR: Estimated Creatinine Clearance: 31.7 mL/min (A) (by C-G formula based on SCr of 1.93 mg/dL (H)). Liver Function Tests: Recent Labs  Lab 06/27/20 2000 06/28/20 0450  AST 216* 131*  ALT 164* 150*  ALKPHOS 254* 205*  BILITOT 1.2 1.2  PROT 7.3 6.4*  ALBUMIN 3.6 3.0*   Recent Labs  Lab 06/27/20 2000  LIPASE 20   No results for input(s): AMMONIA in the last 168 hours. Coagulation Profile: Recent Labs  Lab 06/27/20 2043  INR 1.0   Cardiac Enzymes: No results for input(s): CKTOTAL, CKMB, CKMBINDEX, TROPONINI in the last 168 hours. BNP (last 3 results) No results for input(s): PROBNP in the last 8760 hours. HbA1C: No results for input(s): HGBA1C in the last 72 hours. CBG: No results for input(s): GLUCAP in the last 168 hours. Lipid Profile: No results for input(s): CHOL, HDL, LDLCALC, TRIG, CHOLHDL, LDLDIRECT in the last 72 hours. Thyroid Function Tests: No results for input(s): TSH, T4TOTAL, FREET4, T3FREE, THYROIDAB in the last 72 hours. Anemia Panel: No results for input(s): VITAMINB12, FOLATE, FERRITIN, TIBC, IRON, RETICCTPCT in the last 72 hours. Sepsis Labs: Recent Labs  Lab 06/27/20 2000 06/27/20 2042 06/28/20 0450  PROCALCITON  3.32  --  16.52  LATICACIDVEN  --  0.9  --     Recent Results (from the past 240 hour(s))  Blood Culture (routine x 2)     Status: None (Preliminary result)   Collection Time: 06/27/20  8:00 PM   Specimen: BLOOD  Result Value Ref Range Status   Specimen Description   Final    BLOOD SITE NOT SPECIFIED Performed at [facility] [address], [place], [place] [address]    Special Requests   Final    BOTTLES DRAWN AEROBIC ONLY Blood Culture results may not be optimal due to an inadequate volume of blood received in culture bottles Performed at [facility] [address], [place], [place] [address]    Culture   Final    NO GROWTH < 24 HOURS Performed at [facility] [address], [place], [place] [address]    Report Status PENDING  Incomplete  Blood Culture (routine x 2)     Status: None (Preliminary result)   Collection Time: 06/27/20  8:42 PM   Specimen: BLOOD  Result Value Ref Range Status   Specimen Description   Final    BLOOD LEFT ANTECUBITAL Performed at [facility] [address] [place], [place] [address]    Special Requests   Final    BOTTLES DRAWN AEROBIC AND ANAEROBIC Blood Culture adequate volume Performed at [facility] [address], [place], [place] [address]    Culture   Final    NO GROWTH < 24 HOURS Performed at [facility] [address], [place],  [address]    Report Status PENDING  Incomplete  SARS Coronavirus 2 by RT PCR (hospital order, performed in [facility] hospital lab) Nasopharyngeal Nasopharyngeal Swab     Status: None   Collection Time: 06/28/20  1:00 AM   Specimen: Nasopharyngeal Swab  Result Value Ref Range Status   SARS Coronavirus 2 NEGATIVE NEGATIVE Final    Comment: (NOTE) SARS-CoV-2 target  nucleic acids are NOT DETECTED.  The SARS-CoV-2 RNA is generally detectable in upper and lower respiratory specimens during the acute phase of infection.  The lowest concentration of SARS-CoV-2 viral copies this assay can detect is 250 copies / mL. A negative result does not preclude SARS-CoV-2 infection and should not be used as the sole basis for treatment or other patient management decisions.  A negative result may occur with improper specimen collection / handling, submission of specimen other than nasopharyngeal swab, presence of viral mutation(s) within the areas targeted by this assay, and inadequate number of viral copies (<250 copies / mL). A negative result must be combined with clinical observations, patient history, and epidemiological information.  Fact Sheet for Patients:   StrictlyIdeas.no  Fact Sheet for Healthcare Providers: BankingDealers.co.za  This test is not yet approved or  cleared by the [place] FDA and has been authorized for detection and/or diagnosis of SARS-CoV-2 by FDA under an Emergency Use Authorization (EUA).  This EUA will remain in effect (meaning this test can be used) for the duration of the COVID-19 declaration under Section 564(b)(1) of the Act, 21 U.S.C. section 360bbb-3(b)(1), unless the authorization is terminated or revoked sooner.  Performed at [facility], [place] [address] [place], [place] [address]      RN Pressure Injury Documentation:     Estimated body mass index is 22.05 kg/m as calculated from the following:   Height as of this encounter: 5' 8"  (1.727 m).   Weight as of this encounter: 65.8 kg.  Malnutrition Type:  Nutrition Problem: Increased nutrient needs Etiology: chronic illness, altered GI function (SGS)   Malnutrition Characteristics:  Signs/Symptoms: estimated needs   Nutrition Interventions:  Interventions: Refer to RD note for recommendations   Radiology Studies: CT ABDOMEN PELVIS WO CONTRAST  Result Date: 06/27/2020 CLINICAL DATA:  Fever x1 day with abdominal pain. EXAM: CT ABDOMEN  AND PELVIS WITHOUT CONTRAST TECHNIQUE: Multidetector CT imaging of the abdomen and pelvis was performed following the standard protocol without IV contrast. COMPARISON:  May 05, 2020 FINDINGS: Lower chest: No acute abnormality. Hepatobiliary: No focal liver abnormality is seen. Status post cholecystectomy. Moderate severity pneumobilia is noted. This is seen on the prior study. Pancreas: There is stable dilatation of the pancreatic duct. The pancreatic parenchyma is normal in appearance. Spleen: Normal in size without focal abnormality. Adrenals/Urinary Tract: Adrenal glands are unremarkable. Kidneys are normal, without renal calculi, focal lesion, or hydronephrosis. Bladder is unremarkable. Stomach/Bowel: Stomach is within normal limits. The appendix is not identified. Surgically anastomosed bowel is seen within the mid and lower abdomen. No evidence of bowel dilatation. Vascular/Lymphatic: No significant vascular findings are present. The venous catheter is seen entering via the left groin. This is unchanged in position when compared to the prior exam. No enlarged abdominal or pelvic lymph nodes. Reproductive: Status post hysterectomy. No adnexal masses. Other: No abdominal wall hernia or abnormality. No abdominopelvic ascites. Musculoskeletal: A stable 1.6 cm x 0.8 cm sclerotic focus is seen adjacent to the anterolateral aspect of the left acetabulum. No acute osseous abnormalities are identified. IMPRESSION: 1. Evidence of prior cholecystectomy. 2. Stable dilatation of the pancreatic duct. 3. Stable sclerotic focus adjacent to the anterolateral aspect of the left acetabulum. This may represent a bone island. Electronically Signed   By: Virgina Norfolk M.D.   On: 06/27/2020 20:23   DG Chest Port 1 View  Result Date: 06/27/2020 CLINICAL DATA:  Fever.  Abdominal pain.  Headache. EXAM: PORTABLE CHEST 1 VIEW  COMPARISON:  None. FINDINGS: Heart size is normal. Surgical clips project over the right hilum and  chest. These are stable. No edema or effusion is present. No airspace disease is evident. The visualized soft tissues and bony thorax are unremarkable. IMPRESSION: No acute cardiopulmonary disease or significant interval change. Electronically Signed   By: San Morelle M.D.   On: 06/27/2020 20:16   Scheduled Meds: . amLODipine  10 mg Oral Daily  . budesonide  9 mg Oral Daily  . buPROPion  200 mg Oral Daily  . dicyclomine  20 mg Oral TID  . DULoxetine  90 mg Oral Daily  . estradiol  2 mg Oral Daily  . famotidine  20 mg Oral QHS  . hydrALAZINE  50 mg Oral TID  . methadone  5 mg Oral 5 X Daily  . metoprolol tartrate  100 mg Oral BID  . pantoprazole  40 mg Oral Daily  . potassium chloride SA  40 mEq Oral Daily  . sucralfate  1 g Oral TID WC & HS  . vitamin B-12  1,000 mcg Oral Daily   Continuous Infusions: . sodium chloride 100 mL/hr at 06/28/20 0131  . piperacillin-tazobactam 3.375 g (06/28/20 1210)  . vancomycin      LOS: 0 days   Kerney Elbe, DO [facility] PAGER is on [place]  If 7PM-7AM, please contact night-coverage [email or web address]

## 2020-06-28 NOTE — ED Notes (Signed)
Patient stating she has the urge to urinate, but has been unable to do so.

## 2020-06-28 NOTE — ED Notes (Signed)
Pt was made aware for need of urine specimen.

## 2020-06-29 DIAGNOSIS — I129 Hypertensive chronic kidney disease with stage 1 through stage 4 chronic kidney disease, or unspecified chronic kidney disease: Secondary | ICD-10-CM

## 2020-06-29 DIAGNOSIS — R945 Abnormal results of liver function studies: Secondary | ICD-10-CM

## 2020-06-29 LAB — CBC WITH DIFFERENTIAL/PLATELET
Abs Immature Granulocytes: 0.03 10*3/uL (ref 0.00–0.07)
Basophils Absolute: 0 10*3/uL (ref 0.0–0.1)
Basophils Relative: 0 %
Eosinophils Absolute: 0.1 10*3/uL (ref 0.0–0.5)
Eosinophils Relative: 2 %
HCT: 29.3 % — ABNORMAL LOW (ref 36.0–46.0)
Hemoglobin: 9.3 g/dL — ABNORMAL LOW (ref 12.0–15.0)
Immature Granulocytes: 0 %
Lymphocytes Relative: 15 %
Lymphs Abs: 1 10*3/uL (ref 0.7–4.0)
MCH: 31.5 pg (ref 26.0–34.0)
MCHC: 31.7 g/dL (ref 30.0–36.0)
MCV: 99.3 fL (ref 80.0–100.0)
Monocytes Absolute: 0.3 10*3/uL (ref 0.1–1.0)
Monocytes Relative: 5 %
Neutro Abs: 5.3 10*3/uL (ref 1.7–7.7)
Neutrophils Relative %: 78 %
Platelets: 135 10*3/uL — ABNORMAL LOW (ref 150–400)
RBC: 2.95 MIL/uL — ABNORMAL LOW (ref 3.87–5.11)
RDW: 13.6 % (ref 11.5–15.5)
WBC: 6.8 10*3/uL (ref 4.0–10.5)
nRBC: 0 % (ref 0.0–0.2)

## 2020-06-29 LAB — PROCALCITONIN: Procalcitonin: 12.75 ng/mL

## 2020-06-29 LAB — URINE CULTURE: Culture: NO GROWTH

## 2020-06-29 LAB — C DIFFICILE QUICK SCREEN W PCR REFLEX
C Diff antigen: NEGATIVE
C Diff interpretation: NOT DETECTED
C Diff toxin: NEGATIVE

## 2020-06-29 LAB — CREATININE, SERUM
Creatinine, Ser: 1.96 mg/dL — ABNORMAL HIGH (ref 0.44–1.00)
GFR calc Af Amer: 32 mL/min — ABNORMAL LOW (ref >60)
GFR calc non Af Amer: 27 mL/min — ABNORMAL LOW (ref >60)

## 2020-06-29 NOTE — Consult Note (Signed)
[facility] for Infectious Disease    Date of Admission:  06/27/2020           Day 3 vancomycin        Day 3 piperacillin tazobactam              Reason for Consult: Transient fever    Referring Provider: Dr. Florencia Reasons  Assessment: The cause of her transient fever remains uncertain.  It sounds similar to her pattern goal for the last 2 decades.  She tells me that she does not believe her catheter is currently infected.  If she remains afebrile and blood cultures remain negative I would recommend stopping empiric antibiotics tomorrow and letting her go home.  Plan: 1. I will follow up in the morning  Active Problems:   Fever   Crohn's colitis ([place])   Short gut syndrome   On total parenteral nutrition (TPN)   Anemia   HTN (hypertension)   Depression   Elevated liver enzymes   Stage 3b chronic kidney disease   Scheduled Meds: . amLODipine  10 mg Oral Daily  . budesonide  9 mg Oral Daily  . buPROPion  200 mg Oral Daily  . dicyclomine  20 mg Oral TID  . DULoxetine  90 mg Oral Daily  . estradiol  2 mg Oral Daily  . famotidine  20 mg Oral QHS  . hydrALAZINE  50 mg Oral TID  . methadone  5 mg Oral 5 X Daily  . metoprolol tartrate  100 mg Oral BID  . pantoprazole  40 mg Oral Daily  . potassium chloride SA  40 mEq Oral Daily  . sucralfate  1 g Oral TID WC & HS  . vitamin B-12  1,000 mcg Oral Daily   Continuous Infusions: . sodium chloride 100 mL/hr at 06/29/20 0944  . sodium chloride Stopped (06/29/20 9485)  . piperacillin-tazobactam 3.375 g (06/29/20 1359)  . vancomycin Stopped (06/28/20 2228)   PRN Meds:.sodium chloride, acetaminophen, butalbital-acetaminophen-caffeine, diphenoxylate-atropine, HYDROmorphone (DILAUDID) injection, lipase/protease/amylase, ondansetron (ZOFRAN) IV **OR** ondansetron, traMADol  HPI: Caitlin Peters is a 59 y.o. female with a history of longstanding Crohn's disease who has undergone 13 separate abdominal surgeries over the last  several decades.  She developed short gut syndrome and started on TPN in 2003.  She has had multiple hospitalizations over the past 2 decades, often after developing fever.  On some occasions her blood cultures have been positive and she was felt to have PICC associated infection.  On other occasions she has had fever with negative blood cultures.  She recalls that on many occasions her antibiotics were stopped and she seemed to do fine for the next month before developing problems again.  She moved here from [place] last November.  She has had 8 hospitalizations since that time.  She had transient fever last November and again in December.  Blood cultures were negative and she was discharged after several days on no antibiotics.  She had Enterobacter bacteremia in late December.  Her left femoral PICC line was left in because of no other access was available.  She was treated with 14 days of levofloxacin with improvement.  She developed recurrent Enterobacter bacteremia in February.  She had exchange of the left femoral catheter and was treated with 3 weeks of IV ertapenem.  She was readmitted in April with stenotrophomonas bacteremia.  She underwent left femoral catheter exchange again and was treated with 3 weeks of IV  trimethoprim sulfamethoxazole.  She was hospitalized in early June with Pseudomonas putida bacteremia.  She underwent repeat left femoral catheter exchange and received 3 weeks of IV piperacillin tazobactam completing therapy on 05/30/2020.  She became progressively weaker and had a temperature of 102 degrees leading to admission here 2 days ago.  Her temperature was 100.2 degrees on admission but she rapidly became afebrile and has done well since admission on empiric vancomycin and piperacillin tazobactam.  Blood cultures remain negative.   Review of Systems: Review of Systems  Constitutional: Positive for fever and malaise/fatigue. Negative for chills, diaphoresis and weight loss.  HENT:  Negative for congestion and sore throat.   Respiratory: Positive for shortness of breath. Negative for cough.   Cardiovascular: Negative for chest pain.  Gastrointestinal: Positive for abdominal pain and diarrhea. Negative for blood in stool, nausea and vomiting.  Genitourinary: Negative for dysuria.  Musculoskeletal: Negative for back pain and joint pain.  Neurological: Negative for headaches.    Past Medical History:  Diagnosis Date  . Acute pancreatitis 04/13/2020  . Anasarca 10/2019  . AVN (avascular necrosis of bone) ([place])   . Cataract   . Chronic pain syndrome   . CKD (chronic kidney disease), stage III   . Crohn disease ([place])   . Crohn disease ([place])   . Depression   . Diverticulosis   . GERD (gastroesophageal reflux disease)   . HTN (hypertension)   . IDA (iron deficiency anemia)   . Malnutrition ([place])   . Mass in chest   . Osteoporosis   . Pancreatitis   . Short gut syndrome   . Vitamin B12 deficiency     Social History   Tobacco Use  . Smoking status: Former Research scientist (life sciences)  . Smokeless tobacco: Never Used  Vaping Use  . Vaping Use: Never used  Substance Use Topics  . Alcohol use: Not Currently  . Drug use: Never    Family History  Problem Relation Age of Onset  . Breast cancer Sister   . Multiple sclerosis Sister   . Diabetes Sister   . Lupus Sister   . Colon cancer Other   . Crohn's disease Other   . Seizures Mother   . Glaucoma Mother   . CAD Father   . Heart disease Father   . Hypertension Father    Allergies  Allergen Reactions  . Cefepime Other (See Comments)    Neurotoxicity occurring in setting of AKI. Ceftriaxone tolerated during same admit  . Gabapentin Other (See Comments)    unknown  . Hyoscyamine Hives and Swelling    Legs swelling   Disorientation  . Lyrica [Pregabalin] Other (See Comments)    unknown  . Meperidine Hives    Other reaction(s): GI Upset Due to [place]   . Topamax [Topiramate] Other (See Comments)    unknown  .  Fentanyl Rash    Pt is allergic to fentanyl patch related to the glue (gives her a rash) Pt states she is NOT allergic to fentanyl IV medicine  . Morphine And Related Rash    OBJECTIVE: Blood pressure 129/68, pulse 60, temperature (!) 97.5 F (36.4 C), temperature source Oral, resp. rate 20, height 5' 8"  (1.727 m), weight 65.8 kg, SpO2 98 %.  Physical Exam Constitutional:      Comments: She is very pleasant and talkative.  Cardiovascular:     Rate and Rhythm: Normal rate and regular rhythm.     Heart sounds: No murmur heard.   Pulmonary:  Effort: Pulmonary effort is normal.     Breath sounds: Normal breath sounds.  Abdominal:     Palpations: Abdomen is soft.     Tenderness: There is no abdominal tenderness.  Musculoskeletal:     Comments: She has a PICC in her proximal left thigh.  The exit site looks good without any redness or discharge.  There is no tenderness or swelling around the site.  Skin:    Findings: No rash.  Psychiatric:        Mood and Affect: Mood normal.     Lab Results Lab Results  Component Value Date   WBC 6.8 06/29/2020   HGB 9.3 (L) 06/29/2020   HCT 29.3 (L) 06/29/2020   MCV 99.3 06/29/2020   PLT 135 (L) 06/29/2020    Lab Results  Component Value Date   CREATININE 1.96 (H) 06/29/2020   BUN 48 (H) 06/28/2020   NA 138 06/28/2020   K 4.3 06/28/2020   CL 100 06/28/2020   CO2 30 06/28/2020    Lab Results  Component Value Date   ALT 150 (H) 06/28/2020   AST 131 (H) 06/28/2020   ALKPHOS 205 (H) 06/28/2020   BILITOT 1.2 06/28/2020     Microbiology: Recent Results (from the past 240 hour(s))  Blood Culture (routine x 2)     Status: None (Preliminary result)   Collection Time: 06/27/20  8:00 PM   Specimen: BLOOD  Result Value Ref Range Status   Specimen Description   Final    BLOOD SITE NOT SPECIFIED Performed at [facility], [address] [place], [place] [address]    Special Requests   Final    BOTTLES DRAWN AEROBIC ONLY  Blood Culture results may not be optimal due to an inadequate volume of blood received in culture bottles Performed at [facility] [address] [place], [place] [address]    Culture   Final    NO GROWTH 2 DAYS Performed at [facility] [address], [place], [place] [address]    Report Status PENDING  Incomplete  Blood Culture (routine x 2)     Status: None (Preliminary result)   Collection Time: 06/27/20  8:42 PM   Specimen: BLOOD  Result Value Ref Range Status   Specimen Description   Final    BLOOD LEFT ANTECUBITAL Performed at [facility] [address] [place], [place] [address]    Special Requests   Final    BOTTLES DRAWN AEROBIC AND ANAEROBIC Blood Culture adequate volume Performed at [facility] [address], [place], [place] [address]    Culture   Final    NO GROWTH 2 DAYS Performed at [facility] [address], [place],  [address]    Report Status PENDING  Incomplete  SARS Coronavirus 2 by RT PCR (hospital order, performed in [facility] hospital lab) Nasopharyngeal Nasopharyngeal Swab     Status: None   Collection Time: 06/28/20  1:00 AM   Specimen: Nasopharyngeal Swab  Result Value Ref Range Status   SARS Coronavirus 2 NEGATIVE NEGATIVE Final    Comment: (NOTE) SARS-CoV-2 target nucleic acids are NOT DETECTED.  The SARS-CoV-2 RNA is generally detectable in upper and lower respiratory specimens during the acute phase of infection. The lowest concentration of SARS-CoV-2 viral copies this assay can detect is 250 copies / mL. A negative result does not preclude SARS-CoV-2 infection and should not be used as the sole basis  for treatment or other patient management decisions.  A negative result may occur with improper specimen collection / handling, submission of specimen other than nasopharyngeal swab, presence of viral mutation(s) within the areas targeted by this assay, and  inadequate number of viral copies (<250 copies / mL). A negative result must be combined with clinical observations, patient history, and epidemiological information.  Fact Sheet for Patients:   StrictlyIdeas.no  Fact Sheet for Healthcare Providers: BankingDealers.co.za  This test is not yet approved or  cleared by the [place] FDA and has been authorized for detection and/or diagnosis of SARS-CoV-2 by FDA under an Emergency Use Authorization (EUA).  This EUA will remain in effect (meaning this test can be used) for the duration of the COVID-19 declaration under Section 564(b)(1) of the Act, 21 U.S.C. section 360bbb-3(b)(1), unless the authorization is terminated or revoked sooner.  Performed at [facility], [place] [address] [place], [place] [address]   Urine culture     Status: None   Collection Time: 06/28/20  4:50 AM   Specimen: In/Out Cath Urine  Result Value Ref Range Status   Specimen Description   Final    IN/OUT CATH URINE Performed at [facility] [address], [place], [place] [address]    Special Requests   Final    NONE Performed at [facility], [place] [address], [place], [place] [address]    Culture   Final    NO GROWTH Performed at [facility], [place] [address] [place], [place] [address]    Report Status 06/29/2020 FINAL  Final    Michel Bickers, MD [facility] for [facility] [phone] pager   [phone] cell 06/29/2020, 3:54 PM

## 2020-06-29 NOTE — TOC Initial Note (Signed)
Transition of Care [facility]) - Initial/Assessment Note    Patient Details  Name: Caitlin Peters MRN: [id number] Date of Birth: 07-27-1961  [facility] CM/SW Contact:    Lynnell Catalan, RN Phone Number: 06/29/2020, 2:11 PM  Clinical Narrative:                 Pt from home with [facility] and IV TPN. [facility] was providing HHRN and [facility] was providing IV infusion services. Pam with [facility] contacted this CM to alert of prior services. Pam to follow pt throughout stay and can resume care at dc. TOC will continue to follow along and assist with dc planning as needed.  Expected Discharge Plan: [place] Barriers to Discharge: Continued Medical Work up  Expected Discharge Plan and Services Expected Discharge Plan: [place]   Discharge Planning Services: CM Consult   Living arrangements for the past 2 months: Apartment                           HH Arranged: RN (IV TPN) HH Agency: [facility], [facility] Date [place]: 06/29/20 Time HH Agency Contacted: 34 Representative spoke with at [place]: Pam at [place] Arrangements/Services Living arrangements for the past 2 months: [place] with:: Adult Children          Need for Family Participation in Patient Care: Yes (Comment) Care giver support system in place?: Yes (comment) Current home services: Home RN, Other (comment) (Home TPN)    Activities of Daily Living Home Assistive Devices/Equipment: Environmental consultant (specify type) ADL Screening (condition at time of admission) Patient's cognitive ability adequate to safely complete daily activities?: Yes Is the patient deaf or have difficulty hearing?: No Does the patient have difficulty seeing, even when wearing glasses/contacts?: No Does the patient have difficulty concentrating, remembering, or making decisions?: No Patient able to express need for assistance with ADLs?: Yes Does the patient have  difficulty dressing or bathing?: No Independently performs ADLs?: Yes (appropriate for developmental age) Does the patient have difficulty walking or climbing stairs?: Yes Weakness of Legs: Both Weakness of Arms/Hands: Both  Admission diagnosis:  Fever of unknown origin [R50.9] Patient Active Problem List   Diagnosis Date Noted  . Fever of unknown origin 06/27/2020  . Anxiety 06/03/2020  . Febrile illness, acute 05/06/2020  . Febrile illness 05/05/2020  . Acute pancreatitis 04/13/2020  . Intractable nausea and vomiting 04/13/2020  . Infection due to Stenotrophomonas maltophilia 03/10/2020  . Sepsis ([place]) 03/05/2020  . Abnormal LFTs 03/05/2020  . Pancytopenia ([place]) 03/05/2020  . Stage 3b chronic kidney disease 03/05/2020  . Bacteremia   . Infection of peripherally inserted central venous catheter (PICC)   . On total parenteral nutrition (TPN) 01/02/2020  . Elevated liver enzymes 01/02/2020  . Cholangitis   . Generalized abdominal pain   . Intractable abdominal pain 11/25/2019  . Anasarca 10/28/2019  . Acute kidney injury superimposed on chronic kidney disease ([place]) 10/28/2019  . Falls 10/28/2019  . Crohn disease ([place])   . Malnutrition ([place])   . Short gut syndrome   . HTN (hypertension)   . Vitamin B12 deficiency   . Osteoporosis   . Depression   . GERD (gastroesophageal reflux disease)   . Chronic pain syndrome   . Hypokalemia due to excessive gastrointestinal loss of potassium 10/13/2019  . Diarrhea   . Fever   . Hypokalemia 10/12/2019  .  Chronic diarrhea 10/12/2019  . Dysuria 10/12/2019  . Bilateral lower extremity edema 10/12/2019  . Crohn's colitis ([place]) 10/12/2019  . Anemia 10/12/2019  . Hypertension 10/12/2019  . AKI (acute kidney injury) ([place]) 10/12/2019   PCP:  [name], [name], MD Pharmacy:   [facility] - [place], [place] - [address] [place] [address] Phone: [phone] Fax:  [phone]     Social Determinants of Health (SDOH) Interventions    Readmission Risk Interventions Readmission Risk Prevention Plan 06/29/2020 11/30/2019 10/30/2019  Transportation Screening Complete Complete Complete  PCP or Specialist Appt within 3-5 Days - - Not Complete  Not Complete comments - - pending d/c date to arrange  [place] or [place] - - Complete  Social Work Consult for [place] Planning/Counseling - - Not Complete  SW consult not completed comments - - RNCM met with pt  Palliative Care Screening - - Not Applicable  Medication Review ([place]) Complete Complete Complete  PCP or Specialist appointment within 3-5 days of discharge Complete Complete -  [place] or Home Care Consult Complete Complete -  SW Recovery Care/Counseling Consult Complete Complete -  Palliative Care Screening Not Applicable Not Applicable -  [place] Not Applicable Not Applicable -

## 2020-06-29 NOTE — Progress Notes (Addendum)
PROGRESS NOTE    Caitlin Peters  ZJQ:[id number] DOB: 1961/06/26 DOA: 06/27/2020 PCP: Caitlin Peters    Chief Complaint  Patient presents with  . Abdominal Pain  . Fever    Brief Narrative:  HPI per Caitlin Peters on 06/27/20 Caitlin Peters a 59 y.o.femalewith medical history significant fordependent since 2003, frequent PICC line infections, CKD stage III, hypertension, anemia of chronic disease and iron deficiency, GERD, depression, and chronic pain who presents to the ED for evaluation of fever.  Starting yesterday she began to feel very fatigued and tired. Falling asleep just doing her laundry. Then today she began to notice hot and cold chills and her daughter checked her temperature and found it to be 102Fand prompted her to present to the ED since she has history of recurrent infections and is on TPN. She denies any nausea or vomiting. She has chronic diarrhea and goes about 5 times a day and this has remained unchanged. Denies any bright red blood per rectum or dark stools. However has noted more left-sided spasm abdominal pain in the last 2 days. Denies any dysuria, increased urine frequency or urgency. Denies any new cough, runny nose, chest pain or shortness of breath. She last had TPN this morning denies any issues with her line located on her left anterior thigh. She continues to take some meals by mouth but has noted decrease appetite lately.  She was last hospitalized from 05/05/20-05/10/20 where presented with fever of 120F, nausea, abdominal pain and found to have pseudomonas [place] bacteremia and was kept on IV Zosyn for 3 weeks.Her left thigh PICC line was last changed around 6/14.   ED Course:Febrile up to 100.29F andbecame hypoxic down to 88% following receiving IV Dilaudid for pain had to be placed on 2 L O2 via nasal cannula. No leukocytosis, hemoglobin with anemia remained stable. Normal lactate of 0.9. LFTs although this is chronic  from TPN use.     Subjective:  Reports lower abdominal pain, she has chronic ab pain but pain gets worse when she is sick She has watery diarrhea but reports she has it on and off Fever subsided No n/v tpn on hold, getting ivf She remains npo,   Assessment & Plan:   Principal Problem:   Fever of unknown origin Active Problems:   Crohn's colitis ([place])   Anemia   Short gut syndrome   HTN (hypertension)   Depression   On total parenteral nutrition (TPN)   Elevated liver enzymes   Stage 3b chronic kidney disease   Fever of unknown origin -With history of Pseudomonas [place] bacteremia and is TPN dependent; has a PICC line in her left leg -on admission as she met SIRS criteria of having a temperature of 100.9, tachycardia as well as a tachypnea and now has a leukocytosis of 10.7 -Blood culture has been negative -She is treated vancomycin and Zosyn since admission, reports feeling better this morning -Currently TPN on hold , ID consulted   Extensive Crohn's disease s/p multiple bowel resection and 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 -Continue with budesonide 9 mg p.o. daily  Acute respiratory failure with hypoxia following IV opiate administration -Patient with some desaturation on 80% following IV Dilaudid.  -She is on room air this morning , no hypoxia ,monitor  Chronic transaminitis/Abnormal LFTs -Secondary to chronic TPN use -LFT close to baseline  Chronic pancreatic duct  dilatation -Seen on previous CT abdomen pelvis imaging and is stable -Continue with Creon 36,000 units p.o. 3 times daily as needed for meals for digestion  Hypertension -Continue Amlodipine 10 mg p.o. daily metoprolol tartrate 100 mg p.o. twice daily as well as hydralazine 50 mg p.o. 3 times daily -Patient blood pressure is remaining stable at 121/67 -Continue monitor blood pressures  per protocol  Chronic kidney disease stage IIIb with anemia of chronic disease and iron deficiency -Stable creatinine. -Patient's BUNs/creatinine went from 52/1.96 is now 48/1.93 -Avoid nephrotoxic medications, contrast dyes, hypotension and renally adjust medications X- -Hgb stable at baseline  Chronic mild thrombocytopenia -Platelet range from 1 16-1 35, close to baseline  Chronic pain She is on methadone, she is followed by pain clinic  DVT prophylaxis: SCDs Start: 06/27/20 2115   Code Status: Full Family Communication: Patient, she declined my offer to call her family Disposition:   Status is: Inpatient   Dispo: The patient is from: home              Anticipated d/c is to: home              Anticipated d/c date is: TBD              Patient currently on clears, needs ID consult  Consultants:   ID Caitlin Peters   Procedures:   none  Antimicrobials:   Vank Zosyn     Objective: Vitals:   06/28/20 1120 06/28/20 1505 06/28/20 1821 06/29/20 0536  BP: 140/71 130/83 121/67 (!) 160/72  Pulse: 79 (!) 57 (!) 59 (!) 54  Resp: _0 Temp: 99.2 F (37.3 C) 98.8 F (37.1 C) 98.1 F (36.7 C) 98.2 F (36.8 C)  TempSrc: Oral Oral Oral Oral  SpO2: 97% 95% 96% 100%  Weight:      Height:        Intake/Output Summary (Last 24 hours) at 06/29/2020 1056 Last data filed at 06/29/2020 0700 Gross per 24 hour  Intake 2194.15 ml  Output 600 ml  Net 1594.15 ml   Filed Weights   06/27/20 2044  Weight: 65.8 kg    Examination:  General exam: calm, NAD Respiratory system: Clear to auscultation. Respiratory effort normal. Cardiovascular system: S1 & S2 heard, RRR. No JVD, no murmur, No pedal edema. Gastrointestinal system: prior healed midline surgical scar, Abdomen is nondistended, soft and nontender.  Normal bowel sounds heard. Central nervous system: Alert and oriented. No focal neurological deficits. Extremities: Symmetric 5 x 5 power. picc line in left thigh    Skin: No rashes, lesions or ulcers Psychiatry: Judgement and insight appear normal. Mood & affect appropriate.     Data Reviewed: I have personally reviewed following labs and imaging studies  CBC: Recent Labs  Lab 06/27/20 2000 06/28/20 0450  WBC 5.7 10.7*  NEUTROABS 5.0  --   HGB 9.3* 8.3*  HCT 28.9* 25.5*  MCV 97.0 96.2  PLT 143* 116*    Basic Metabolic Panel: Recent Labs  Lab 06/27/20 2000 06/28/20 0450 06/29/20 0810  NA 139 138  --   K 3.8 4.3  --   CL 99 100  --   CO2 28 30  --   GLUCOSE 76 90  --   BUN 52* 48*  --   CREATININE 1.96* 1.93* 1.96*  CALCIUM 9.1 8.9  --     GFR: Estimated Creatinine Clearance: 31.2 mL/min (A) (by C-G formula based on SCr of 1.96 mg/dL (H)).  Liver  Function Tests: Recent Labs  Lab 06/27/20 2000 06/28/20 0450  AST 216* 131*  ALT 164* 150*  ALKPHOS 254* 205*  BILITOT 1.2 1.2  PROT 7.3 6.4*  ALBUMIN 3.6 3.0*    CBG: No results for input(s): GLUCAP in the last 168 hours.   Recent Results (from the past 240 hour(s))  Blood Culture (routine x 2)     Status: None (Preliminary result)   Collection Time: 06/27/20  8:00 PM   Specimen: BLOOD  Result Value Ref Range Status   Specimen Description   Final    BLOOD SITE NOT SPECIFIED Performed at [facility], [address] [place], [place] [address]    Special Requests   Final    BOTTLES DRAWN AEROBIC ONLY Blood Culture results may not be optimal due to an inadequate volume of blood received in culture bottles Performed at [facility] [address], [place], [place] [address]    Culture   Final    NO GROWTH 2 DAYS Performed at [place] [address], [place], [place] [address]    Report Status PENDING  Incomplete  Blood Culture (routine x 2)     Status: None (Preliminary result)   Collection Time: 06/27/20  8:42 PM   Specimen: BLOOD  Result Value Ref Range Status   Specimen Description   Final    BLOOD LEFT  ANTECUBITAL Performed at [facility] [address] [place], [place] [address]    Special Requests   Final    BOTTLES DRAWN AEROBIC AND ANAEROBIC Blood Culture adequate volume Performed at [facility] [address], [place], [place] [address]    Culture   Final    NO GROWTH 2 DAYS Performed at [facility] [place] [address] [place],  [address]    Report Status PENDING  Incomplete  SARS Coronavirus 2 by RT PCR (hospital order, performed in [facility] hospital lab) Nasopharyngeal Nasopharyngeal Swab     Status: None   Collection Time: 06/28/20  1:00 AM   Specimen: Nasopharyngeal Swab  Result Value Ref Range Status   SARS Coronavirus 2 NEGATIVE NEGATIVE Final    Comment: (NOTE) SARS-CoV-2 target nucleic acids are NOT DETECTED.  The SARS-CoV-2 RNA is generally detectable in upper and lower respiratory specimens during the acute phase of infection. The lowest concentration of SARS-CoV-2 viral copies this assay can detect is 250 copies / mL. A negative result does not preclude SARS-CoV-2 infection and should not be used as the sole basis for treatment or other patient management decisions.  A negative result may occur with improper specimen collection / handling, submission of specimen other than nasopharyngeal swab, presence of viral mutation(s) within the areas targeted by this assay, and inadequate number of viral copies (<250 copies / mL). A negative result must be combined with clinical observations, patient history, and epidemiological information.  Fact Sheet for Patients:   StrictlyIdeas.no  Fact Sheet for Healthcare Providers: BankingDealers.co.za  This test is not yet approved or  cleared by the [place] FDA and has been authorized for detection and/or diagnosis of SARS-CoV-2 by FDA under an Emergency Use Authorization (EUA).  This EUA will remain in effect (meaning  this test can be used) for the duration of the COVID-19 declaration under Section 564(b)(1) of the Act, 21 U.S.C. section 360bbb-3(b)(1), unless the authorization is terminated or revoked sooner.  Performed at [facility], [address] [place], [place]  [address]   Urine culture     Status: None   Collection Time: 06/28/20  4:50 AM   Specimen: In/Out Cath Urine  Result Value Ref Range Status   Specimen Description   Final    IN/OUT CATH URINE Performed at [facility], [place] [address], [place], [place] [address]    Special Requests   Final    NONE Performed at [facility], [place] [address] [place], [place] [address]    Culture   Final    NO GROWTH Performed at [facility], [place] [address] [place], [place] [address]    Report Status 06/29/2020 FINAL  Final         Radiology Studies: CT ABDOMEN PELVIS WO CONTRAST  Result Date: 06/27/2020 CLINICAL DATA:  Fever x1 day with abdominal pain. EXAM: CT ABDOMEN AND PELVIS WITHOUT CONTRAST TECHNIQUE: Multidetector CT imaging of the abdomen and pelvis was performed following the standard protocol without IV contrast. COMPARISON:  May 05, 2020 FINDINGS: Lower chest: No acute abnormality. Hepatobiliary: No focal liver abnormality is seen. Status post cholecystectomy. Moderate severity pneumobilia is noted. This is seen on the prior study. Pancreas: There is stable dilatation of the pancreatic duct. The pancreatic parenchyma is normal in appearance. Spleen: Normal in size without focal abnormality. Adrenals/Urinary Tract: Adrenal glands are unremarkable. Kidneys are normal, without renal calculi, focal lesion, or hydronephrosis. Bladder is unremarkable. Stomach/Bowel: Stomach is within normal limits. The appendix is not identified. Surgically anastomosed bowel is seen within the mid and lower abdomen. No evidence of bowel dilatation. Vascular/Lymphatic: No significant vascular  findings are present. The venous catheter is seen entering via the left groin. This is unchanged in position when compared to the prior exam. No enlarged abdominal or pelvic lymph nodes. Reproductive: Status post hysterectomy. No adnexal masses. Other: No abdominal wall hernia or abnormality. No abdominopelvic ascites. Musculoskeletal: A stable 1.6 cm x 0.8 cm sclerotic focus is seen adjacent to the anterolateral aspect of the left acetabulum. No acute osseous abnormalities are identified. IMPRESSION: 1. Evidence of prior cholecystectomy. 2. Stable dilatation of the pancreatic duct. 3. Stable sclerotic focus adjacent to the anterolateral aspect of the left acetabulum. This may represent a bone island. Electronically Signed   By: Virgina Norfolk M.D.   On: 06/27/2020 20:23   DG Chest Port 1 View  Result Date: 06/27/2020 CLINICAL DATA:  Fever.  Abdominal pain.  Headache. EXAM: PORTABLE CHEST 1 VIEW COMPARISON:  None. FINDINGS: Heart size is normal. Surgical clips project over the right hilum and chest. These are stable. No edema or effusion is present. No airspace disease is evident. The visualized soft tissues and bony thorax are unremarkable. IMPRESSION: No acute cardiopulmonary disease or significant interval change. Electronically Signed   By: San Morelle M.D.   On: 06/27/2020 20:16        Scheduled Meds: . amLODipine  10 mg Oral Daily  . budesonide  9 mg Oral Daily  . buPROPion  200 mg Oral Daily  . dicyclomine  20 mg Oral TID  . DULoxetine  90 mg Oral Daily  . estradiol  2 mg Oral Daily  . famotidine  20 mg Oral QHS  . hydrALAZINE  50 mg Oral TID  . methadone  5 mg Oral 5 X Daily  . metoprolol tartrate  100 mg Oral BID  . pantoprazole  40 mg Oral Daily  . potassium chloride SA  40 mEq Oral Daily  . sucralfate  1 g  Oral TID WC & HS  . vitamin B-12  1,000 mcg Oral Daily   Continuous Infusions: . sodium chloride 100 mL/hr at 06/29/20 0944  . sodium chloride Stopped (06/29/20  8592)  . piperacillin-tazobactam Stopped (06/29/20 9244)  . vancomycin Stopped (06/28/20 2228)     LOS: 1 day   Time spent: 35 mins Greater than 50% of this time was spent in counseling, explanation of diagnosis, planning of further management, and coordination of care.  I have personally reviewed and interpreted on  06/29/2020 daily labs,  imagings as discussed above under date review session and assessment and plans.  I reviewed all nursing notes, pharmacy notes, consultant notes,  vitals, pertinent old records  I have discussed plan of care as described above with RN , patient  on 06/29/2020  Voice Recognition /Dragon dictation system was used to create this note, attempts have been made to correct errors. Please contact the author with questions and/or clarifications.   Florencia Reasons, MD PhD FACP [facility]  Available via Epic secure chat 7am-7pm for nonurgent issues Please page for urgent issues To page the attending provider between 7A-7P or the covering provider during after hours 7P-7A, please log into the web site [email or web address] and access using universal [place] password for that web site. If you do not have the password, please call the hospital operator.    06/29/2020, 10:56 AM

## 2020-06-30 ENCOUNTER — Ambulatory Visit: Payer: Medicare Other | Admitting: Family Medicine

## 2020-06-30 LAB — MAGNESIUM: Magnesium: 1.9 mg/dL (ref 1.7–2.4)

## 2020-06-30 LAB — COMPREHENSIVE METABOLIC PANEL
ALT: 86 U/L — ABNORMAL HIGH (ref 0–44)
AST: 42 U/L — ABNORMAL HIGH (ref 15–41)
Albumin: 3 g/dL — ABNORMAL LOW (ref 3.5–5.0)
Alkaline Phosphatase: 176 U/L — ABNORMAL HIGH (ref 38–126)
Anion gap: 7 (ref 5–15)
BUN: 21 mg/dL — ABNORMAL HIGH (ref 6–20)
CO2: 22 mmol/L (ref 22–32)
Calcium: 9.1 mg/dL (ref 8.9–10.3)
Chloride: 107 mmol/L (ref 98–111)
Creatinine, Ser: 1.82 mg/dL — ABNORMAL HIGH (ref 0.44–1.00)
GFR calc Af Amer: 35 mL/min — ABNORMAL LOW (ref >60)
GFR calc non Af Amer: 30 mL/min — ABNORMAL LOW (ref >60)
Glucose, Bld: 94 mg/dL (ref 70–99)
Potassium: 3.7 mmol/L (ref 3.5–5.1)
Sodium: 136 mmol/L (ref 135–145)
Total Bilirubin: 0.8 mg/dL (ref 0.3–1.2)
Total Protein: 6.7 g/dL (ref 6.5–8.1)

## 2020-06-30 LAB — LACTIC ACID, PLASMA: Lactic Acid, Venous: 0.8 mmol/L (ref 0.5–1.9)

## 2020-06-30 MED ORDER — HYDROMORPHONE HCL 4 MG PO TABS: 4.0000 mg | ORAL_TABLET | Freq: Four times a day (QID) | ORAL | 0 refills | Status: DC | PRN

## 2020-06-30 MED ORDER — CHLORHEXIDINE GLUCONATE CLOTH 2 % EX PADS
6.0000 | MEDICATED_PAD | Freq: Every day | CUTANEOUS | Status: DC
  Administered 2020-07-01: 6 via TOPICAL

## 2020-06-30 MED ORDER — SODIUM CHLORIDE 0.9% FLUSH: 10.0000 mL | INTRAVENOUS | Status: DC | PRN

## 2020-06-30 MED ORDER — METHADONE HCL 5 MG PO TABS: 5.0000 mg | ORAL_TABLET | Freq: Every day | ORAL | 0 refills | Status: DC

## 2020-06-30 MED ORDER — SODIUM CHLORIDE 0.9% FLUSH
10.0000 mL | Freq: Two times a day (BID) | INTRAVENOUS | Status: DC
  Administered 2020-06-30 – 2020-07-01 (×3): 10 mL

## 2020-06-30 MED ORDER — VANCOMYCIN HCL IN DEXTROSE 1-5 GM/200ML-% IV SOLN
1000.0000 mg | INTRAVENOUS | Status: DC
  Administered 2020-06-30: 1000 mg via INTRAVENOUS
  Filled 2020-06-30: qty 200

## 2020-06-30 MED ORDER — HYDROMORPHONE HCL 2 MG/ML IJ SOLN
2.0000 mg | Freq: Once | INTRAMUSCULAR | Status: AC
  Administered 2020-06-30: 2 mg via INTRAVENOUS
  Filled 2020-06-30: qty 1

## 2020-06-30 MED ORDER — PIPERACILLIN-TAZOBACTAM 3.375 G IVPB
3.3750 g | Freq: Three times a day (TID) | INTRAVENOUS | Status: DC
  Administered 2020-06-30 – 2020-07-01 (×3): 3.375 g via INTRAVENOUS
  Filled 2020-06-30 (×4): qty 50

## 2020-06-30 NOTE — Progress Notes (Signed)
PHARMACY - TOTAL PARENTERAL NUTRITION CONSULT NOTE   Indication: Short bowel syndrome  Patient Measurements: Height: 5' 8"  (172.7 cm) Weight: 65.8 kg (145 lb) IBW/kg (Calculated) : 63.9 TPN AdjBW (KG): 65.8 Body mass index is 22.05 kg/m.   Assessment: 59 y/o F on chronic TPN for short gut syndrome. Patient has chronic diarrhea and consumes PO food for comfort. Patient is admitted with fever of unknown origin.   Glucose / Insulin: CBGs WNL Electrolytes: WNL Renal: SCr 1.82 near baseline LFTs / TGs: LFTs w/ mild elevation Prealbumin / albumin: pending/ 3 Intake / Output; MIVF: NS @ 100, food for comfort, UOP [place] GI Imaging: Surgeries / Procedures:   Central access: 6/14 TPN start date: 8/5  Nutritional Goals (per RD recommendation on 8/3): kCal: 2000-2200, Protein: 110-115, Fluid: 2L/day  Current Nutrition:  Regular diet, eats for comfort  Plan:  Order TPN labs NS @ 100 ml/hr, eating so don't need a dextrose componenet Monitor TPN labs on Mon/Thurs, initial labs in AM  [place], Nicki Reaper D 06/30/2020,3:28 PM

## 2020-06-30 NOTE — Progress Notes (Signed)
   06/30/20 1632  Assess: MEWS Score  Temp (!) 102.5 F (39.2 C)  BP (!) 138/56  Pulse Rate 74  Resp 15  SpO2 98 %  O2 Device Room Air  Assess: MEWS Score  MEWS Temp 2  MEWS Systolic 0  MEWS Pulse 0  MEWS RR 0  MEWS LOC 0  MEWS Score 2  MEWS Score Color Yellow  Assess: if the MEWS score is Yellow or Red  Were vital signs taken at a resting state? Yes  Focused Assessment No change from prior assessment  Early Detection of Sepsis Score *See Row Information* Medium  MEWS guidelines implemented *See Row Information* Yes  Treat  MEWS Interventions Administered prn meds/treatments  Take Vital Signs  Increase Vital Sign Frequency  Yellow: Q 2hr X 2 then Q 4hr X 2, if remains yellow, continue Q 4hrs  Escalate  MEWS: Escalate Yellow: discuss with charge nurse/RN and consider discussing with provider and RRT  Notify: Charge Nurse/RN  Name of Charge Nurse/RN Notified self  Notify: Provider  Provider Name/Title Dr Erlinda Hong  Date Provider Notified 06/30/20  Time Provider Notified 1635  Notification Type Page  Notification Reason Change in status  Response No new orders  Date of Provider Response 06/30/20  Time of Provider Response 5831  Document  Patient Outcome Stabilized after interventions  Progress note created (see row info) Yes

## 2020-06-30 NOTE — Progress Notes (Signed)
Patient ID: Caitlin Peters, female   DOB: 04-Oct-1961, 59 y.o.   MRN: [id number]         [facility] for Infectious Disease  Date of Admission:  06/27/2020           Day 4 vancomycin        Day 4 piperacillin tazobactam ASSESSMENT: She remains afebrile and blood cultures are negative at 72 hours.  I recommend discontinuing empiric antibiotics and discharging her home.  PLAN: 1. Discontinue vancomycin and piperacillin tazobactam 2. Recommend discharge home 3. She has follow-up in our clinic next week 4. I will sign off now  Active Problems:   Fever   Crohn's colitis ([place])   Short gut syndrome   On total parenteral nutrition (TPN)   Anemia   HTN (hypertension)   Depression   Elevated liver enzymes   Stage 3b chronic kidney disease   Scheduled Meds: . amLODipine  10 mg Oral Daily  . budesonide  9 mg Oral Daily  . buPROPion  200 mg Oral Daily  . dicyclomine  20 mg Oral TID  . DULoxetine  90 mg Oral Daily  . estradiol  2 mg Oral Daily  . famotidine  20 mg Oral QHS  . hydrALAZINE  50 mg Oral TID  . methadone  5 mg Oral 5 X Daily  . metoprolol tartrate  100 mg Oral BID  . pantoprazole  40 mg Oral Daily  . potassium chloride SA  40 mEq Oral Daily  . sucralfate  1 g Oral TID WC & HS  . vitamin B-12  1,000 mcg Oral Daily   Continuous Infusions: . sodium chloride 100 mL/hr at 06/29/20 1746  . sodium chloride Stopped (06/29/20 2376)  . piperacillin-tazobactam 3.375 g (06/30/20 0600)  . vancomycin 1,000 mg (06/29/20 1948)   PRN Meds:.sodium chloride, acetaminophen, butalbital-acetaminophen-caffeine, diphenoxylate-atropine, HYDROmorphone (DILAUDID) injection, lipase/protease/amylase, ondansetron (ZOFRAN) IV **OR** ondansetron, traMADol   SUBJECTIVE: She feels well and back to her normal baseline.  Review of Systems: Review of Systems  Constitutional: Negative for chills, diaphoresis and fever.  Gastrointestinal: Positive for diarrhea. Negative for abdominal pain,  nausea and vomiting.    Allergies  Allergen Reactions  . Cefepime Other (See Comments)    Neurotoxicity occurring in setting of AKI. Ceftriaxone tolerated during same admit  . Gabapentin Other (See Comments)    unknown  . Hyoscyamine Hives and Swelling    Legs swelling   Disorientation  . Lyrica [Pregabalin] Other (See Comments)    unknown  . Meperidine Hives    Other reaction(s): GI Upset Due to Chrones   . Topamax [Topiramate] Other (See Comments)    unknown  . Fentanyl Rash    Pt is allergic to fentanyl patch related to the glue (gives her a rash) Pt states she is NOT allergic to fentanyl IV medicine  . Morphine And Related Rash    OBJECTIVE: Vitals:   06/29/20 0536 06/29/20 1317 06/29/20 2101 06/30/20 0604  BP: (!) 160/72 129/68 132/67 (!) 158/67  Pulse: (!) 54 60 (!) 54 61  Resp: 18 20  16   Temp: 98.2 F (36.8 C) (!) 97.5 F (36.4 C) 98.2 F (36.8 C) 98.3 F (36.8 C)  TempSrc: Oral Oral Oral Oral  SpO2: 100% 98% 94% 100%  Weight:      Height:       Body mass index is 22.05 kg/m.  Physical Exam Constitutional:      Comments: She is resting quietly in bed.  She is  in very good spirits.  Cardiovascular:     Rate and Rhythm: Normal rate and regular rhythm.     Heart sounds: No murmur heard.   Pulmonary:     Effort: Pulmonary effort is normal.     Breath sounds: Normal breath sounds.  Abdominal:     Palpations: Abdomen is soft.     Tenderness: There is no abdominal tenderness.  Skin:    Comments: Her left femoral PICC site looks good.  Psychiatric:        Mood and Affect: Mood normal.     Lab Results Lab Results  Component Value Date   WBC 6.8 06/29/2020   HGB 9.3 (L) 06/29/2020   HCT 29.3 (L) 06/29/2020   MCV 99.3 06/29/2020   PLT 135 (L) 06/29/2020    Lab Results  Component Value Date   CREATININE 1.82 (H) 06/30/2020   BUN 21 (H) 06/30/2020   NA 136 06/30/2020   K 3.7 06/30/2020   CL 107 06/30/2020   CO2 22 06/30/2020    Lab  Results  Component Value Date   ALT 86 (H) 06/30/2020   AST 42 (H) 06/30/2020   ALKPHOS 176 (H) 06/30/2020   BILITOT 0.8 06/30/2020     Microbiology: Recent Results (from the past 240 hour(s))  Blood Culture (routine x 2)     Status: None (Preliminary result)   Collection Time: 06/27/20  8:00 PM   Specimen: BLOOD  Result Value Ref Range Status   Specimen Description   Final    BLOOD SITE NOT SPECIFIED Performed at [facility], [place] [address] [place], [place] [address]    Special Requests   Final    BOTTLES DRAWN AEROBIC ONLY Blood Culture results may not be optimal due to an inadequate volume of blood received in culture bottles Performed at [place] [address] [place], [place] [address]    Culture   Final    NO GROWTH 2 DAYS Performed at [facility] [address] [place], [place] [address]    Report Status PENDING  Incomplete  Blood Culture (routine x 2)     Status: None (Preliminary result)   Collection Time: 06/27/20  8:42 PM   Specimen: BLOOD  Result Value Ref Range Status   Specimen Description   Final    BLOOD LEFT ANTECUBITAL Performed at [facility] [address], [place], [place] [address]    Special Requests   Final    BOTTLES DRAWN AEROBIC AND ANAEROBIC Blood Culture adequate volume Performed at [facility] [address], [place], [place] [address]    Culture   Final    NO GROWTH 2 DAYS Performed at [place] [facility] [address], [place],  [address]    Report Status PENDING  Incomplete  SARS Coronavirus 2 by RT PCR (hospital order, performed in [facility] hospital lab) Nasopharyngeal Nasopharyngeal Swab     Status: None   Collection Time: 06/28/20  1:00 AM   Specimen: Nasopharyngeal Swab  Result Value Ref Range Status   SARS Coronavirus 2 NEGATIVE NEGATIVE Final    Comment: (NOTE) SARS-CoV-2 target nucleic acids are NOT DETECTED.  The SARS-CoV-2 RNA is  generally detectable in upper and lower respiratory specimens during the acute phase of infection. The lowest concentration of SARS-CoV-2 viral copies this assay can detect is 250 copies / mL. A negative result does not preclude SARS-CoV-2 infection and should not be used as  the sole basis for treatment or other patient management decisions.  A negative result may occur with improper specimen collection / handling, submission of specimen other than nasopharyngeal swab, presence of viral mutation(s) within the areas targeted by this assay, and inadequate number of viral copies (<250 copies / mL). A negative result must be combined with clinical observations, patient history, and epidemiological information.  Fact Sheet for Patients:   StrictlyIdeas.no  Fact Sheet for Healthcare Providers: BankingDealers.co.za  This test is not yet approved or  cleared by the [place] FDA and has been authorized for detection and/or diagnosis of SARS-CoV-2 by FDA under an Emergency Use Authorization (EUA).  This EUA will remain in effect (meaning this test can be used) for the duration of the COVID-19 declaration under Section 564(b)(1) of the Act, 21 U.S.C. section 360bbb-3(b)(1), unless the authorization is terminated or revoked sooner.  Performed at [facility], [place] [address], [place], [place] [address]   Urine culture     Status: None   Collection Time: 06/28/20  4:50 AM   Specimen: In/Out Cath Urine  Result Value Ref Range Status   Specimen Description   Final    IN/OUT CATH URINE Performed at [place] [address], [place], [place] [address]    Special Requests   Final    NONE Performed at [facility], [place] [address], [place], [place] [address]    Culture   Final    NO GROWTH Performed at [facility], [place] [address] [place],  [address]    Report Status  06/29/2020 FINAL  Final  C Difficile Quick Screen w PCR reflex     Status: None   Collection Time: 06/29/20  3:28 PM   Specimen: STOOL  Result Value Ref Range Status   C Diff antigen NEGATIVE NEGATIVE Final   C Diff toxin NEGATIVE NEGATIVE Final   C Diff interpretation No C. difficile detected.  Final    Comment: Performed at [facility], [place] [address] [place],  [address]    Michel Bickers, [place] [facility] [phone] pager   [phone] cell 06/30/2020, 9:19 AM

## 2020-06-30 NOTE — Progress Notes (Signed)
Pharmacy Antibiotic Note  Caitlin Peters is a 59 y.o. female on chronic TPN with history of recurrent bacteremia admitted on 06/27/2020 with fever.  Pharmacy has been consulted for vancomycin and zosyn dosing. Antibiotics d/c 8/5 per ID recommendation with planned discharge cnacelled due to patient developing chills and fever.   Plan: Resume Zosyn 3.375gm IV q8h (4hr extended infusions) Resume Vancomycin 1g IV q24h Check vancomycin trough at steady state if > 5 days Follow up renal function & cultures  Height: 5' 8"  (172.7 cm) Weight: 65.8 kg (145 lb) IBW/kg (Calculated) : 63.9  Temp (24hrs), Avg:98.8 F (37.1 C), Min:98.2 F (36.8 C), Max:100 F (37.8 C)  Recent Labs  Lab 06/27/20 2000 06/27/20 2042 06/28/20 0450 06/29/20 0810 06/29/20 1114 06/30/20 0555  WBC 5.7  --  10.7*  --  6.8  --   CREATININE 1.96*  --  1.93* 1.96*  --  1.82*  LATICACIDVEN  --  0.9  --   --   --  0.8    Estimated Creatinine Clearance: 33.6 mL/min (A) (by C-G formula based on SCr of 1.82 mg/dL (H)).    Allergies  Allergen Reactions  . Cefepime Other (See Comments)    Neurotoxicity occurring in setting of AKI. Ceftriaxone tolerated during same admit  . Gabapentin Other (See Comments)    unknown  . Hyoscyamine Hives and Swelling    Legs swelling   Disorientation  . Lyrica [Pregabalin] Other (See Comments)    unknown  . Meperidine Hives    Other reaction(s): GI Upset Due to Chrones   . Topamax [Topiramate] Other (See Comments)    unknown  . Fentanyl Rash    Pt is allergic to fentanyl patch related to the glue (gives her a rash) Pt states she is NOT allergic to fentanyl IV medicine  . Morphine And Related Rash    Antimicrobials this admission:  8/2 Vanc >> 8/2 Zosyn >>  Dose adjustments this admission:   Microbiology results:  8/2 BCx: NGTD 8/5 BCx:  8/3 UCx: NGF 8/2 COVID: neg 8/4 C diff: neg   Thank you for allowing pharmacy to be a part of this patient's care.  Ulice Dash, PharmD, BCPS 06/30/2020 3:13 PM

## 2020-06-30 NOTE — Progress Notes (Signed)
PROGRESS NOTE    Caitlin Peters  BLT:[id number] DOB: 09/02/1961 DOA: 06/27/2020 PCP: [name], [name], MD    Chief Complaint  Patient presents with  . Abdominal Pain  . Fever    Brief Narrative:  HPI per Dr. Ileene Musa on 06/27/20 Caitlin Hales Harrisonis a 59 y.o.femalewith medical history significant fordependent since 2003, frequent PICC line infections, CKD stage III, hypertension, anemia of chronic disease and iron deficiency, GERD, depression, and chronic pain who presents to the ED for evaluation of fever.  Starting yesterday she began to feel very fatigued and tired. Falling asleep just doing her laundry. Then today she began to notice hot and cold chills and her daughter checked her temperature and found it to be 102Fand prompted her to present to the ED since she has history of recurrent infections and is on TPN. She denies any nausea or vomiting. She has chronic diarrhea and goes about 5 times a day and this has remained unchanged. Denies any bright red blood per rectum or dark stools. However has noted more left-sided spasm abdominal pain in the last 2 days. Denies any dysuria, increased urine frequency or urgency. Denies any new cough, runny nose, chest pain or shortness of breath. She last had TPN this morning denies any issues with her line located on her left anterior thigh. She continues to take some meals by mouth but has noted decrease appetite lately.  She was last hospitalized from 05/05/20-05/10/20 where presented with fever of 120F, nausea, abdominal pain and found to have pseudomonas [place] bacteremia and was kept on IV Zosyn for 3 weeks.Her left thigh PICC line was last changed around 6/14.   ED Course:Febrile up to 100.5F andbecame hypoxic down to 88% following receiving IV Dilaudid for pain had to be placed on 2 L O2 via nasal cannula. No leukocytosis, hemoglobin with anemia remained stable. Normal lactate of 0.9. LFTs although this is chronic  from TPN use.   Subjective:  Plan discharge canceled, due to patient started have shaking chills, temperature recheck 99.2 and then 100  Assessment & Plan:   Active Problems:   Crohn's colitis ([place])   Anemia   Fever   Short gut syndrome   HTN (hypertension)   Depression   On total parenteral nutrition (TPN)   Elevated liver enzymes   Stage 3b chronic kidney disease   Fever of unknown origin,  -With history ofPseudomonas Pitudabacteremia and is TPN dependent;has a PICC line in her left leg -on admission as she met SIRS criteria of having a temperature of 100.9, tachycardia as well as a tachypnea and now has a leukocytosis of 10.7 -Blood culture has been negative, UA unremarkable, chest x-ray no acute findings -C. difficile negative -CT abdomen pelvis no acute findings -She is treated vancomycin and Zosyn since admission,reports feeling better on 8/5 am, planned to discharge, however started to have shaking chills temperature start to go up in afternoon while waiting for discharge , discharge canceled , repeat blood culture and UA  -Case discussed with infectious disease Dr. Megan Salon who recommend restart Vanco and Zosyn, can resume TPN per Dr. 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 Domenica Fail.  Follow-up with pain clinic -CT abdomen pelvis on presentation no acute findings -Continue with budesonide 9 mg p.o. daily  Acuterespiratory failure with hypoxia following IV opiate administration -Patient with some desaturation  on 80% following IV Dilaudid. -She is on room air this morning,no hypoxia -Resolved  Chronic transaminitis/Abnormal LFTs -History of cholecystectomy -CT abdomen pelvis no acute findings -Secondary to chronic TPN use -LFT close to baseline  Chronic pancreatic duct  dilatation -Seen on previous CT abdomen pelvis imaging and is stable -Continue with Creon 36,000 units p.o. 3 times daily as needed for meals for digestion  Hypertension -ContinueAmlodipine10 mg p.o. daily metoprolol tartrate 100 mg p.o. twice daily as well as hydralazine 50 mg p.o. 3 times daily -Patient blood pressure is remaining stable   Chronic kidney disease stage IIIbwith anemia of chronic diseaseand irondeficiency -Stable creatinine. -Patient's BUNs/creatinine went from 52/1.96 is now 21/1.82 -Avoid nephrotoxic medications -Hgb stable at baseline  Chronic mild thrombocytopenia -Platelet range from 1 16-1 35,close to baseline  Chronic pain She is on methadone and as needed Dilaudid chronically She missed her pain clinic appointment due to hospitalization , she request 1 week of supply of methadone and Dilaudid at discharge , she is to follow-up with pain clinic next week.  DVT prophylaxis: SCDs Start: 06/27/20 2115  Code Status:Full Family Communication:Patient,she declined my offer to call her family Disposition:  Status is: Inpatient   Dispo: The patient is from:home Anticipated d/c is QI:ONGE nticipated d/c date is: To be determined              Patient currently have shaking chills,  temperature start to go up   Consultants:  ID Dr Megan Salon  Procedures:  none  Antimicrobials: Vanc Zosyn     Objective: Vitals:   06/29/20 2101 06/30/20 0604 06/30/20 1245 06/30/20 1344  BP: 132/67 (!) 158/67 (!) 146/65   Pulse: (!) 54 61 (!) 59   Resp:  16 15   Temp: 98.2 F (36.8 C) 98.3 F (36.8 C) 98.2 F (36.8 C) 99.2 F (37.3 C)  TempSrc: Oral Oral Oral Oral  SpO2: 94% 100% 96%   Weight:      Height:        Intake/Output Summary (Last 24 hours) at 06/30/2020 1455 Last data filed at 06/29/2020 1746 Gross per 24 hour  Intake 1500.77 ml  Output 400 ml  Net 1100.77 ml   Filed Weights   06/27/20 2044   Weight: 65.8 kg    Examination:  General exam: calm, NAD Respiratory system: Clear to auscultation. Respiratory effort normal. Cardiovascular system: S1 & S2 heard, RRR. No JVD, no murmur, No pedal edema. Gastrointestinal system: Abdomen is nondistended, soft and nontender. No organomegaly or masses felt. Normal bowel sounds heard. Central nervous system: Alert and oriented. No focal neurological deficits. Extremities: Symmetric 5 x 5 power. Skin: No rashes, lesions or ulcers Psychiatry: Judgement and insight appear normal. Mood & affect appropriate.     Data Reviewed: I have personally reviewed following labs and imaging studies  CBC: Recent Labs  Lab 06/27/20 2000 06/28/20 0450 06/29/20 1114  WBC 5.7 10.7* 6.8  NEUTROABS 5.0  --  5.3  HGB 9.3* 8.3* 9.3*  HCT 28.9* 25.5* 29.3*  MCV 97.0 96.2 99.3  PLT 143* 116* 135*    Basic Metabolic Panel: Recent Labs  Lab 06/27/20 2000 06/28/20 0450 06/29/20 0810 06/30/20 0555  NA 139 138  --  136  K 3.8 4.3  --  3.7  CL 99 100  --  107  CO2 28 30  --  22  GLUCOSE 76 90  --  94  BUN 52* 48*  --  21*  CREATININE 1.96* 1.93* 1.96* 1.82*  CALCIUM 9.1 8.9  --  9.1  MG  --   --   --  1.9    GFR: Estimated Creatinine Clearance: 33.6 mL/min (A) (by C-G formula based on SCr of 1.82 mg/dL (H)).  Liver Function Tests: Recent Labs  Lab 06/27/20 2000 06/28/20 0450 06/30/20 0555  AST 216* 131* 42*  ALT 164* 150* 86*  ALKPHOS 254* 205* 176*  BILITOT 1.2 1.2 0.8  PROT 7.3 6.4* 6.7  ALBUMIN 3.6 3.0* 3.0*    CBG: No results for input(s): GLUCAP in the last 168 hours.   Recent Results (from the past 240 hour(s))  Blood Culture (routine x 2)     Status: None (Preliminary result)   Collection Time: 06/27/20  8:00 PM   Specimen: BLOOD  Result Value Ref Range Status   Specimen Description   Final    BLOOD SITE NOT SPECIFIED Performed at [facility], [address], [place], [place] [address]    Special Requests    Final    BOTTLES DRAWN AEROBIC ONLY Blood Culture results may not be optimal due to an inadequate volume of blood received in culture bottles Performed at [facility] [address] [place], [place] [address]    Culture   Final    NO GROWTH 3 DAYS Performed at [facility], [place] [address], [place], [place] [address]    Report Status PENDING  Incomplete  Blood Culture (routine x 2)     Status: None (Preliminary result)   Collection Time: 06/27/20  8:42 PM   Specimen: BLOOD  Result Value Ref Range Status   Specimen Description   Final    BLOOD LEFT ANTECUBITAL Performed at [facility] [address], [place], [place] [address]    Special Requests   Final    BOTTLES DRAWN AEROBIC AND ANAEROBIC Blood Culture adequate volume Performed at [facility] [address], [place], [place] [address]    Culture   Final    NO GROWTH 3 DAYS Performed at [facility], [place] [address] [place], [place] [address]    Report Status PENDING  Incomplete  SARS Coronavirus 2 by RT PCR (hospital order, performed in [facility] hospital lab) Nasopharyngeal Nasopharyngeal Swab     Status: None   Collection Time: 06/28/20  1:00 AM   Specimen: Nasopharyngeal Swab  Result Value Ref Range Status   SARS Coronavirus 2 NEGATIVE NEGATIVE Final    Comment: (NOTE) SARS-CoV-2 target nucleic acids are NOT DETECTED.  The SARS-CoV-2 RNA is generally detectable in upper and lower respiratory specimens during the acute phase of infection. The lowest concentration of SARS-CoV-2 viral copies this assay can detect is 250 copies / mL. A negative result does not preclude SARS-CoV-2 infection and should not be used as the sole basis for treatment or other patient management decisions.  A negative result may occur with improper specimen collection / handling, submission of specimen other than nasopharyngeal swab, presence of viral mutation(s) within  the areas targeted by this assay, and inadequate number of viral copies (<250 copies / mL). A negative result must be combined with clinical observations, patient history, and epidemiological information.  Fact Sheet for Patients:   StrictlyIdeas.no  Fact Sheet for Healthcare Providers: BankingDealers.co.za  This test is not yet approved or  cleared by the [place] FDA and has been authorized for detection and/or diagnosis of SARS-CoV-2 by FDA under an Emergency Use Authorization (EUA).  This  EUA will remain in effect (meaning this test can be used) for the duration of the COVID-19 declaration under Section 564(b)(1) of the Act, 21 U.S.C. section 360bbb-3(b)(1), unless the authorization is terminated or revoked sooner.  Performed at [facility], [place] [address], [place], [place] [address]   Urine culture     Status: None   Collection Time: 06/28/20  4:50 AM   Specimen: In/Out Cath Urine  Result Value Ref Range Status   Specimen Description   Final    IN/OUT CATH URINE Performed at [facility] [address] [place], [place] [address]    Special Requests   Final    NONE Performed at [facility], [place] [address], [place], [place] [address]    Culture   Final    NO GROWTH Performed at [facility], [place] [address] [place], [place] [address]    Report Status 06/29/2020 FINAL  Final  C Difficile Quick Screen w PCR reflex     Status: None   Collection Time: 06/29/20  3:28 PM   Specimen: STOOL  Result Value Ref Range Status   C Diff antigen NEGATIVE NEGATIVE Final   C Diff toxin NEGATIVE NEGATIVE Final   C Diff interpretation No C. difficile detected.  Final    Comment: Performed at [facility], [place] [address] [place],  [address]         Radiology Studies: No results found.      Scheduled Meds: . amLODipine  10 mg Oral  Daily  . budesonide  9 mg Oral Daily  . buPROPion  200 mg Oral Daily  . dicyclomine  20 mg Oral TID  . DULoxetine  90 mg Oral Daily  . estradiol  2 mg Oral Daily  . famotidine  20 mg Oral QHS  . hydrALAZINE  50 mg Oral TID  . methadone  5 mg Oral 5 X Daily  . metoprolol tartrate  100 mg Oral BID  . pantoprazole  40 mg Oral Daily  . potassium chloride SA  40 mEq Oral Daily  . sucralfate  1 g Oral TID WC & HS  . vitamin B-12  1,000 mcg Oral Daily   Continuous Infusions: . sodium chloride 100 mL/hr at 06/29/20 1746  . sodium chloride Stopped (06/29/20 0642)     LOS: 2 days   Time spent: 77mns Greater than 50% of this time was spent in counseling, explanation of diagnosis, planning of further management, and coordination of care.  I have personally reviewed and interpreted on  06/30/2020 daily labs,  imagings as discussed above under date review session and assessment and plans.  I reviewed all nursing notes, pharmacy notes, consultant notes,  vitals, pertinent old records  I have discussed plan of care as described above with RN , patient and family on 06/30/2020  Voice Recognition /Dragon dictation system was used to create this note, attempts have been made to correct errors. Please contact the author with questions and/or clarifications.   FFlorencia Reasons MD PhD FACP [facility]  Available via Epic secure chat 7am-7pm for nonurgent issues Please page for urgent issues To page the attending provider between 7A-7P or the covering provider during after hours 7P-7A, please log into the web site [email or web address] and access using universal [place] password for that web site. If you do not have the password, please call the hospital operator.    06/30/2020, 2:55 PM

## 2020-06-30 NOTE — TOC Transition Note (Signed)
Transition of Care [facility]) - CM/SW Discharge Note   Patient Details  Name: Caitlin Peters MRN: [id number] Date of Birth: 09-30-1961  Transition of Care [facility] CM/SW Contact:  Jael Kostick, Marjie Skiff, RN Phone Number: 06/30/2020, 10:10 AM   Clinical Narrative:    Pt to dc home today and resume TPN. Pam from [employer] alerted. Pam has also connected with [employer]   Discharge Plan and Services   Discharge Planning Services: CM Consult                      HH Arranged: RN (IV TPN) [place] Agency: [facility], [employer] Date [facility] Agency Contacted: 06/29/20 Time HH Agency Contacted: 1110 Representative spoke with at [place]: [place] at [place] ([place]) Interventions     Readmission Risk Interventions Readmission Risk Prevention Plan 06/29/2020 11/30/2019 10/30/2019  Transportation Screening Complete Complete Complete  PCP or Specialist Appt within 3-5 Days - - Not Complete  Not Complete comments - - pending d/c date to arrange  [place] or [place] - - Complete  Social Work Consult for [place] Planning/Counseling - - Not Complete  SW consult not completed comments - - RNCM met with pt  Palliative Care Screening - - Not Applicable  Medication Review Press photographer) Complete Complete Complete  PCP or Specialist appointment within 3-5 days of discharge Complete Complete -  [place] or Home Care Consult Complete Complete -  SW Recovery Care/Counseling Consult Complete Complete -  Palliative Care Screening Not Applicable Not Applicable -  [place] Not Applicable Not Applicable -

## 2020-06-30 NOTE — Discharge Summary (Signed)
Discharge Summary  [name]:[id number] DOB: 1961-11-26  PCP: [name], [name], MD  Admit date: 06/27/2020 Discharge date: 06/30/2020  Time spent: 80mns, more than 50% time spent on coordination of care.   Recommendations for Outpatient Follow-up:  1. F/u with PCP within a week  for hospital discharge follow up, repeat cbc/bmp at follow up 2. Follow-up with infectious disease Dr. VLucianne Leidam in 1 week 3. Follow-up with [facility] in 1 week  Discharge Diagnoses:  Active Hospital Problems   Diagnosis Date Noted  . Stage 3b chronic kidney disease 03/05/2020  . On total parenteral nutrition (TPN) 01/02/2020  . Elevated liver enzymes 01/02/2020  . Short gut syndrome   . HTN (hypertension)   . Depression   . Fever   . Cro[place]'s [place] ([place] 10/12/2019  . Anemia 10/12/2019    Resolved Hospital Problems  No resolved problems to display.    Discharge Condition: stable  Diet recommendation: Regular diet, continue home TPN  Filed Weights   06/27/20 2044  Weight: 65.8 kg    History of present illness: (Per admitting MD Dr.  CIleene Musa Chief Complaint: Fever and fatigue  HPI: Caitlin FORMISANOis a 59y.o. female with medical history significant for dependent since 2003, frequent PICC line infections, CKD stage III, hypertension, anemia of chronic disease and iron deficiency, GERD, depression, and chronic pain who presents to the ED for evaluation of fever.  Starting yesterday she began to feel very fatigued and tired.  Falling asleep just doing her laundry.  Then today she began to notice hot and cold chills and her daughter checked her temperature and found it to be 102F and prompted her to present to the ED since she has history of recurrent infections and is on TPN.  She denies any nausea or vomiting.  She has chronic diarrhea and goes about 5 times a day and this has remained unchanged.  Denies any bright red blood per rectum or dark stools.  However has noted more  left-sided spasm abdominal pain in the last 2 days.  Denies any dysuria, increased urine frequency or urgency.  Denies any new cough, runny nose, chest pain or shortness of breath. She last had TPN this morning denies any issues with her line located on her left anterior thigh.  She continues to take some meals by mouth but has noted decrease appetite lately.  She was last hospitalized from 05/05/20-05/10/20 where presented with fever of 120F, nausea, abdominal pain and found to have pseudomonas [place] bacteremia and was kept on IV Zosyn for 3 weeks.  Her left thigh PICC line was last changed around 6/14.   ED Course: Febrile up to 100.73F and became hypoxic down to 88% following receiving IV Dilaudid for pain had to be placed on 2 L O2 via nasal cannula. No leukocytosis, hemoglobin with anemia remained stable.  Normal lactate of 0.9.  LFTs although this is chronic from TPN use.  CT abdomen pelvis with no acute findings.  Hospital Course:  Active Problems:   [place]'s [place] ([place]   Anemia   Fever   Short gut syndrome   HTN (hypertension)   Depression   On total parenteral nutrition (TPN)   Elevated liver enzymes   Stage 3b chronic kidney disease  Fever of unknown origin, sepsis ruled out -With history ofPseudomonas Pitudabacteremia and is TPN dependent;has a PICC line in her left leg -on admission as she met SIRS criteria of having a temperature of 100.9, tachycardia as  well as a tachypnea and now has a leukocytosis of 10.7 -Blood culture has been negative, UA unremarkable, chest x-ray no acute findings -C. difficile negative -CT abdomen pelvis no acute findings -She is treated vancomycin and Zosyn since admission, reports feeling better the next morning morning -Infectious disease consulted -TPN on hold initially per ID recommendation, clinically her symptom has resolved, she is feeling back to her baseline, cultures are nonrevealing, antibiotic discontinued per ID  recommendation -Patient is cleared to discharge home and resume home TPN per ID recommendation -She is going to follow-up with infectious disease Dr. 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 Domenica Fail.  Follow-up with pain clinic -CT abdomen pelvis on presentation no acute findings -Continue with budesonide 9 mg p.o. daily  Acuterespiratory failure with hypoxia following IV opiate administration -Patient with some desaturation on 80% following IV Dilaudid. -She is on room air this morning , no hypoxia -Resolved  Chronic transaminitis/Abnormal LFTs -Secondary to chronic TPN use -LFT close to baseline  Chronic pancreatic duct dilatation -Seen on previous CT abdomen pelvis imaging and is stable -Continue with Creon 36,000 units p.o. 3 times daily as needed for meals for digestion  Hypertension -ContinueAmlodipine10 mg p.o. daily metoprolol tartrate 100 mg p.o. twice daily as well as hydralazine 50 mg p.o. 3 times daily -Patient blood pressure is remaining stable   Chronic kidney disease stage IIIb with anemia of chronic disease and iron deficiency -Stable creatinine. -Patient's BUNs/creatinine went from 52/1.96 is now 21/1.82 -Avoid nephrotoxic medications -Hgb stable at baseline  Chronic mild thrombocytopenia -Platelet range from 1 16-1 35, close to baseline  Chronic pain She is on methadone and as needed Dilaudid chronically She missed her pain clinic appointment due to hospitalization , she request 1 week of supply of methadone and Dilaudid at discharge , she is to follow-up with pain clinic next week.  DVT prophylaxis: SCDs Start: 06/27/20 2115  Code Status: Full Family Communication: Patient, she declined my offer to call her family Disposition:   Status is: Inpatient   Dispo:  The patient is from: home  Anticipated d/c is to: home    Consultants:   ID Dr Megan Salon   Procedures:   none  Antimicrobials:   Vank Zosyn   Discharge Exam: BP (!) 158/67 (BP Location: Right Arm)   Pulse 61   Temp 98.3 F (36.8 C) (Oral)   Resp 16   Ht _0  (1.727 m)   Wt 65.8 kg   SpO2 100%   BMI 22.05 kg/m   General: NAD, pleasant Cardiovascular: RRR Respiratory: Clear to auscultation bilaterally  Discharge Instructions You were cared for by a hospitalist during your hospital stay. If you have any questions about your discharge medications or the care you received while you were in the hospital after you are discharged, you can call the unit and asked to speak with the hospitalist on call if the hospitalist that took care of you is not available. Once you are discharged, your primary care physician will handle any further medical issues. Please note that NO REFILLS for any discharge medications will be authorized once you are discharged, as it is imperative that you return to your primary care physician (or establish a relationship with a primary care physician if you do not have one) for your aftercare needs so that they can reassess your  need for medications and monitor your lab values.  Discharge Instructions    Increase activity slowly   Complete by: As directed      Allergies as of 06/30/2020      Reactions   Cefepime Other (See Comments)   Neurotoxicity occurring in setting of AKI. Ceftriaxone tolerated during same admit   Gabapentin Other (See Comments)   unknown   Hyoscyamine Hives, Swelling   Legs swelling Disorientation   Lyrica [pregabalin] Other (See Comments)   unknown   Meperidine Hives   Other reaction(s): GI Upset Due to Chrones   Topamax [topiramate] Other (See Comments)   unknown   Fentanyl Rash   Pt is allergic to fentanyl patch related to the glue (gives her a rash) Pt states she is NOT allergic to fentanyl IV  medicine   Morphine And Related Rash      Medication List    TAKE these medications   acetaminophen 325 MG tablet Commonly known as: TYLENOL Take 650 mg by mouth every 6 (six) hours as needed for mild pain.   amLODipine 10 MG tablet Commonly known as: NORVASC Take 1 tablet (10 mg total) by mouth daily.   budesonide 3 MG 24 hr capsule Commonly known as: ENTOCORT EC TAKE 3 CAPSULES BY MOUTH ONCE DAILY What changed: how much to take   buPROPion 100 MG 12 hr tablet Commonly known as: WELLBUTRIN SR Take 2 tablets (200 mg total) by mouth daily.   calcium carbonate 500 MG chewable tablet Commonly known as: TUMS - dosed in mg elemental calcium Chew 1 tablet by mouth 3 (three) times daily as needed for indigestion or heartburn.   cholecalciferol 25 MCG (1000 UNIT) tablet Commonly known as: VITAMIN D3 Take 1,000 Units by mouth daily.   cycloSPORINE 0.05 % ophthalmic emulsion Commonly known as: RESTASIS Place 1 drop into both eyes 2 (two) times daily.   Dexilant 60 MG capsule Generic drug: dexlansoprazole Take 1 capsule (60 mg total) by mouth daily.   Dextran 70-Hypromellose 0.1-0.3 % Soln Place 1 drop into both eyes 4 (four) times daily.   dicyclomine 20 MG tablet Commonly known as: BENTYL Take 20 mg by mouth 3 (three) times daily.   diphenoxylate-atropine 2.5-0.025 MG tablet Commonly known as: LOMOTIL Take 1 tablet by mouth 4 (four) times daily as needed for diarrhea or loose stools.   DULoxetine 30 MG capsule Commonly known as: CYMBALTA Take 3 capsules (90 mg total) by mouth daily.   estradiol 2 MG tablet Commonly known as: ESTRACE Take 1 tablet (2 mg total) by mouth daily.   famotidine 20 MG tablet Commonly known as: PEPCID Take 20 mg by mouth at bedtime.   hydrALAZINE 50 MG tablet Commonly known as: APRESOLINE Take 1 tablet (50 mg total) by mouth 3 (three) times daily. What changed: Another medication with the same name was removed. Continue taking this  medication, and follow the directions you see here.   HYDROmorphone 4 MG tablet Commonly known as: Dilaudid Take 1 tablet (4 mg total) by mouth every 6 (six) hours as needed for severe pain.   lipase/protease/amylase 36000 UNITS Cpep capsule Commonly known as: Creon Take 1 capsule (36,000 Units total) by mouth 3 (three) times daily as needed (with meals for digestion).   loperamide 2 MG capsule Commonly known as: IMODIUM TAKE 1 CAPSULE BY MOUTH AS NEEDED FOR DIARRHEA OR  LOOSE  STOOLS What changed: See the new instructions.   methadone 5 MG tablet Commonly known as: DOLOPHINE Take 1  tablet (5 mg total) by mouth 5 (five) times daily.   metoprolol tartrate 100 MG tablet Commonly known as: LOPRESSOR Take 1 tablet (100 mg total) by mouth 2 (two) times daily.   MULTIVITAMIN ADULT PO Take 1 tablet by mouth daily.   Omega-3 1000 MG Caps Take 1,000 mg by mouth daily.   potassium chloride SA 20 MEQ tablet Commonly known as: KLOR-CON Take 2 tablets (40 mEq total) by mouth daily.   PRESCRIPTION MEDICATION Inject 1 each into the vein daily. Home TPN . [facility] . 1 bag for 16 hours. [phone]   PROBIOTIC-10 PO Take 1 capsule by mouth daily.   promethazine 25 MG tablet Commonly known as: PHENERGAN Take 1 tablet (25 mg total) by mouth every 6 (six) hours as needed for nausea or vomiting. TAKE 1 TABLET BY MOUTH EVERY 6 HOURS AS NEEDED FOR NAUSEA AND VOMITING What changed: See the new instructions.   sucralfate 1 GM/10ML suspension Commonly known as: Carafate Take 10 mLs (1 g total) by mouth 4 (four) times daily -  with meals and at bedtime. What changed: when to take this   traZODone 100 MG tablet Commonly known as: DESYREL Take 2 tablets (200 mg total) by mouth at bedtime as needed for sleep.   vitamin B-12 100 MCG tablet Commonly known as: CYANOCOBALAMIN Take 1,000 mcg by mouth daily.      Allergies  Allergen Reactions  . Cefepime  Other (See Comments)    Neurotoxicity occurring in setting of AKI. Ceftriaxone tolerated during same admit  . Gabapentin Other (See Comments)    unknown  . Hyoscyamine Hives and Swelling    Legs swelling   Disorientation  . Lyrica [Pregabalin] Other (See Comments)    unknown  . Meperidine Hives    Other reaction(s): GI Upset Due to Chrones   . Topamax [Topiramate] Other (See Comments)    unknown  . Fentanyl Rash    Pt is allergic to fentanyl patch related to the glue (gives her a rash) Pt states she is NOT allergic to fentanyl IV medicine  . Morphine And Related Rash    Follow-up Information    [name], [name], MD Follow up in 1 week(s).   Specialty: Internal Medicine Contact information: [place] [address] [phone]        [facility]              Follow up in 1 week(s).   Contact information: [place] [phone]               The results of significant diagnostics from this hospitalization (including imaging, microbiology, ancillary and laboratory) are listed below for reference.    Significant Diagnostic Studies: CT ABDOMEN PELVIS WO CONTRAST  Result Date: 06/27/2020 CLINICAL DATA:  Fever x1 day with abdominal pain. EXAM: CT ABDOMEN AND PELVIS WITHOUT CONTRAST TECHNIQUE: Multidetector CT imaging of the abdomen and pelvis was performed following the standard protocol without IV contrast. COMPARISON:  May 05, 2020 FINDINGS: Lower chest: No acute abnormality. Hepatobiliary: No focal liver abnormality is seen. Status post cholecystectomy. Moderate severity pneumobilia is noted. This is seen on the prior study. Pancreas: There is stable dilatation of the pancreatic duct. The pancreatic parenchyma is normal in appearance. Spleen: Normal in size without focal abnormality. Adrenals/Urinary Tract: Adrenal glands are unremarkable. Kidneys are normal, without renal  calculi, focal lesion, or hydronephrosis. Bladder  is unremarkable. Stomach/Bowel: Stomach is within normal limits. The appendix is not identified. Surgically anastomosed bowel is seen within the mid and lower abdomen. No evidence of bowel dilatation. Vascular/Lymphatic: No significant vascular findings are present. The venous catheter is seen entering via the left groin. This is unchanged in position when compared to the prior exam. No enlarged abdominal or pelvic lymph nodes. Reproductive: Status post hysterectomy. No adnexal masses. Other: No abdominal wall hernia or abnormality. No abdominopelvic ascites. Musculoskeletal: A stable 1.6 cm x 0.8 cm sclerotic focus is seen adjacent to the anterolateral aspect of the left acetabulum. No acute osseous abnormalities are identified. IMPRESSION: 1. Evidence of prior cholecystectomy. 2. Stable dilatation of the pancreatic duct. 3. Stable sclerotic focus adjacent to the anterolateral aspect of the left acetabulum. This may represent a bone island. Electronically Signed   By: Virgina Norfolk M.D.   On: 06/27/2020 20:23   DG Chest Port 1 View  Result Date: 06/27/2020 CLINICAL DATA:  Fever.  Abdominal pain.  Headache. EXAM: PORTABLE CHEST 1 VIEW COMPARISON:  None. FINDINGS: Heart size is normal. Surgical clips project over the right hilum and chest. These are stable. No edema or effusion is present. No airspace disease is evident. The visualized soft tissues and bony thorax are unremarkable. IMPRESSION: No acute cardiopulmonary disease or significant interval change. Electronically Signed   By: San Morelle M.D.   On: 06/27/2020 20:16    Microbiology: Recent Results (from the past 240 hour(s))  Blood Culture (routine x 2)     Status: None (Preliminary result)   Collection Time: 06/27/20  8:00 PM   Specimen: BLOOD  Result Value Ref Range Status   Specimen Description   Final    BLOOD SITE NOT SPECIFIED Performed at [facility], [address] [place], [place] [address]    Special Requests   Final    BOTTLES DRAWN AEROBIC ONLY Blood Culture results may not be optimal due to an inadequate volume of blood received in culture bottles Performed at [facility] [address] [place], [place] [address]    Culture   Final    NO GROWTH 2 DAYS Performed at [facility] [address] [place], [place] [address]    Report Status PENDING  Incomplete  Blood Culture (routine x 2)     Status: None (Preliminary result)   Collection Time: 06/27/20  8:42 PM   Specimen: BLOOD  Result Value Ref Range Status   Specimen Description   Final    BLOOD LEFT ANTECUBITAL Performed at [facility] [address] [place], [place] [address]    Special Requests   Final    BOTTLES DRAWN AEROBIC AND ANAEROBIC Blood Culture adequate volume Performed at [facility] [address] [place], [place] [address]    Culture   Final    NO GROWTH 2 DAYS Performed at [facility] [address], [place], [place] [address]    Report Status PENDING  Incomplete  SARS Coronavirus 2 by RT PCR (hospital order, performed in [facility] hospital lab) Nasopharyngeal Nasopharyngeal Swab     Status: None   Collection Time: 06/28/20  1:00 AM   Specimen: Nasopharyngeal Swab  Result Value Ref Range Status   SARS Coronavirus 2 NEGATIVE NEGATIVE Final    Comment: (NOTE) SARS-CoV-2 target nucleic acids are NOT DETECTED.  The SARS-CoV-2 RNA is generally detectable in upper and lower respiratory specimens during the acute phase  of infection. The lowest concentration of SARS-CoV-2 viral copies this assay can detect is 250 copies / mL. A negative result does not preclude SARS-CoV-2 infection and should not be used as the sole basis for treatment or other patient management decisions.  A negative result may occur with improper specimen collection / handling, submission of specimen other than  nasopharyngeal swab, presence of viral mutation(s) within the areas targeted by this assay, and inadequate number of viral copies (<250 copies / mL). A negative result must be combined with clinical observations, patient history, and epidemiological information.  Fact Sheet for Patients:   StrictlyIdeas.no  Fact Sheet for Healthcare Providers: BankingDealers.co.za  This test is not yet approved or  cleared by the [place] FDA and has been authorized for detection and/or diagnosis of SARS-CoV-2 by FDA under an Emergency Use Authorization (EUA).  This EUA will remain in effect (meaning this test can be used) for the duration of the COVID-19 declaration under Section 564(b)(1) of the Act, 21 U.S.C. section 360bbb-3(b)(1), unless the authorization is terminated or revoked sooner.  Performed at [facility], [place] [address], [place], [place] [address]   Urine culture     Status: None   Collection Time: 06/28/20  4:50 AM   Specimen: In/Out Cath Urine  Result Value Ref Range Status   Specimen Description   Final    IN/OUT CATH URINE Performed at [facility] [address], [place], [place] [address]    Special Requests   Final    NONE Performed at [facility], [place] [address], [place], [place] [address]    Culture   Final    NO GROWTH Performed at [facility], [place] [address], [place], [place] [address]    Report Status 06/29/2020 FINAL  Final  C Difficile Quick Screen w PCR reflex     Status: None   Collection Time: 06/29/20  3:28 PM   Specimen: STOOL  Result Value Ref Range Status   C Diff antigen NEGATIVE NEGATIVE Final   C Diff toxin NEGATIVE NEGATIVE Final   C Diff interpretation No C. difficile detected.  Final    Comment: Performed at [facility], [place] [address] [place], [place] [address]     Labs: Basic Metabolic Panel: Recent Labs   Lab 06/27/20 2000 06/28/20 0450 06/29/20 0810 06/30/20 0555  NA 139 138  --  136  K 3.8 4.3  --  3.7  CL 99 100  --  107  CO2 28 30  --  22  GLUCOSE 76 90  --  94  BUN 52* 48*  --  21*  CREATININE 1.96* 1.93* 1.96* 1.82*  CALCIUM 9.1 8.9  --  9.1  MG  --   --   --  1.9   Liver Function Tests: Recent Labs  Lab 06/27/20 2000 06/28/20 0450 06/30/20 0555  AST 216* 131* 42*  ALT 164* 150* 86*  ALKPHOS 254* 205* 176*  BILITOT 1.2 1.2 0.8  PROT 7.3 6.4* 6.7  ALBUMIN 3.6 3.0* 3.0*   Recent Labs  Lab 06/27/20 2000  LIPASE 20   No results for input(s): AMMONIA in the last 168 hours. CBC: Recent Labs  Lab 06/27/20 2000 06/28/20 0450 06/29/20 1114  WBC 5.7 10.7* 6.8  NEUTROABS 5.0  --  5.3  HGB 9.3* 8.3* 9.3*  HCT 28.9* 25.5* 29.3*  MCV 97.0 96.2 99.3  PLT 143* 116* 135*   Cardiac Enzymes: No results  for input(s): CKTOTAL, CKMB, CKMBINDEX, TROPONINI in the last 168 hours. BNP: BNP (last 3 results) Recent Labs    10/12/19 2059 10/28/19 0435 11/26/19 0209  BNP 253.9* 162.4* 179.1*    ProBNP (last 3 results) No results for input(s): PROBNP in the last 8760 hours.  CBG: No results for input(s): GLUCAP in the last 168 hours.     Signed:  Florencia Reasons MD, PhD, FACP  [facility] 06/30/2020, 10:42 AM

## 2020-07-01 ENCOUNTER — Other Ambulatory Visit: Payer: Self-pay | Admitting: Internal Medicine

## 2020-07-01 LAB — COMPREHENSIVE METABOLIC PANEL
ALT: 79 U/L — ABNORMAL HIGH (ref 0–44)
AST: 38 U/L (ref 15–41)
Albumin: 2.9 g/dL — ABNORMAL LOW (ref 3.5–5.0)
Alkaline Phosphatase: 238 U/L — ABNORMAL HIGH (ref 38–126)
Anion gap: 9 (ref 5–15)
BUN: 14 mg/dL (ref 6–20)
CO2: 22 mmol/L (ref 22–32)
Calcium: 8.9 mg/dL (ref 8.9–10.3)
Chloride: 108 mmol/L (ref 98–111)
Creatinine, Ser: 1.79 mg/dL — ABNORMAL HIGH (ref 0.44–1.00)
GFR calc Af Amer: 35 mL/min — ABNORMAL LOW (ref >60)
GFR calc non Af Amer: 30 mL/min — ABNORMAL LOW (ref >60)
Glucose, Bld: 82 mg/dL (ref 70–99)
Potassium: 3.5 mmol/L (ref 3.5–5.1)
Sodium: 139 mmol/L (ref 135–145)
Total Bilirubin: 1.3 mg/dL — ABNORMAL HIGH (ref 0.3–1.2)
Total Protein: 6.7 g/dL (ref 6.5–8.1)

## 2020-07-01 LAB — CBC
HCT: 26.1 % — ABNORMAL LOW (ref 36.0–46.0)
Hemoglobin: 8.7 g/dL — ABNORMAL LOW (ref 12.0–15.0)
MCH: 31.2 pg (ref 26.0–34.0)
MCHC: 33.3 g/dL (ref 30.0–36.0)
MCV: 93.5 fL (ref 80.0–100.0)
Platelets: 117 10*3/uL — ABNORMAL LOW (ref 150–400)
RBC: 2.79 MIL/uL — ABNORMAL LOW (ref 3.87–5.11)
RDW: 13.2 % (ref 11.5–15.5)
WBC: 10.4 10*3/uL (ref 4.0–10.5)
nRBC: 0 % (ref 0.0–0.2)

## 2020-07-01 LAB — DIFFERENTIAL
Abs Immature Granulocytes: 0.05 10*3/uL (ref 0.00–0.07)
Basophils Absolute: 0 10*3/uL (ref 0.0–0.1)
Basophils Relative: 0 %
Eosinophils Absolute: 0 10*3/uL (ref 0.0–0.5)
Eosinophils Relative: 0 %
Immature Granulocytes: 1 %
Lymphocytes Relative: 10 %
Lymphs Abs: 1 10*3/uL (ref 0.7–4.0)
Monocytes Absolute: 0.5 10*3/uL (ref 0.1–1.0)
Monocytes Relative: 5 %
Neutro Abs: 8.7 10*3/uL — ABNORMAL HIGH (ref 1.7–7.7)
Neutrophils Relative %: 84 %

## 2020-07-01 LAB — GLUCOSE, CAPILLARY
Glucose-Capillary: 121 mg/dL — ABNORMAL HIGH (ref 70–99)
Glucose-Capillary: 127 mg/dL — ABNORMAL HIGH (ref 70–99)
Glucose-Capillary: 130 mg/dL — ABNORMAL HIGH (ref 70–99)

## 2020-07-01 LAB — PREALBUMIN: Prealbumin: 25.1 mg/dL (ref 18–38)

## 2020-07-01 LAB — TRIGLYCERIDES: Triglycerides: 154 mg/dL — ABNORMAL HIGH (ref <150)

## 2020-07-01 LAB — LACTIC ACID, PLASMA: Lactic Acid, Venous: 0.8 mmol/L (ref 0.5–1.9)

## 2020-07-01 LAB — PHOSPHORUS: Phosphorus: 2.5 mg/dL (ref 2.5–4.6)

## 2020-07-01 LAB — SEDIMENTATION RATE: Sed Rate: 36 mm/hr — ABNORMAL HIGH (ref 0–22)

## 2020-07-01 LAB — MAGNESIUM: Magnesium: 1.6 mg/dL — ABNORMAL LOW (ref 1.7–2.4)

## 2020-07-01 LAB — C-REACTIVE PROTEIN: CRP: 9.8 mg/dL — ABNORMAL HIGH (ref <1.0)

## 2020-07-01 MED ORDER — TRAVASOL 10 % IV SOLN
INTRAVENOUS | Status: AC
  Filled 2020-07-01: qty 990

## 2020-07-01 MED ORDER — INSULIN ASPART 100 UNIT/ML ~~LOC~~ SOLN
0.0000 [IU] | SUBCUTANEOUS | Status: DC
  Administered 2020-07-01 (×2): 1 [IU] via SUBCUTANEOUS

## 2020-07-01 MED ORDER — SODIUM CHLORIDE 0.9 % IV SOLN: INTRAVENOUS | Status: DC

## 2020-07-01 MED ORDER — LOPERAMIDE HCL 2 MG PO CAPS
2.0000 mg | ORAL_CAPSULE | ORAL | Status: DC | PRN
  Administered 2020-07-01 – 2020-07-02 (×3): 2 mg via ORAL
  Filled 2020-07-01 (×3): qty 1

## 2020-07-01 MED ORDER — HYDRALAZINE HCL 50 MG PO TABS
75.0000 mg | ORAL_TABLET | Freq: Three times a day (TID) | ORAL | Status: DC
  Administered 2020-07-01 – 2020-07-02 (×3): 75 mg via ORAL
  Filled 2020-07-01 (×3): qty 1

## 2020-07-01 MED ORDER — TRAZODONE HCL 50 MG PO TABS
100.0000 mg | ORAL_TABLET | Freq: Once | ORAL | Status: AC
  Administered 2020-07-01: 100 mg via ORAL
  Filled 2020-07-01: qty 2

## 2020-07-01 MED ORDER — LEVOFLOXACIN 500 MG PO TABS: 500.0000 mg | ORAL_TABLET | Freq: Every day | ORAL | Status: DC

## 2020-07-01 MED ORDER — LEVOFLOXACIN 750 MG PO TABS
750.0000 mg | ORAL_TABLET | ORAL | Status: DC
  Administered 2020-07-01 – 2020-07-02 (×2): 750 mg via ORAL
  Filled 2020-07-01 (×2): qty 1

## 2020-07-01 MED ORDER — MAGNESIUM SULFATE 2 GM/50ML IV SOLN
2.0000 g | Freq: Once | INTRAVENOUS | Status: AC
  Administered 2020-07-01: 2 g via INTRAVENOUS
  Filled 2020-07-01: qty 50

## 2020-07-01 MED ORDER — INSULIN ASPART 100 UNIT/ML ~~LOC~~ SOLN: 0.0000 [IU] | SUBCUTANEOUS | Status: DC

## 2020-07-01 NOTE — Progress Notes (Addendum)
PHARMACY - TOTAL PARENTERAL NUTRITION CONSULT NOTE   Indication: Short bowel syndrome  Patient Measurements: Height: 5' 8"  (172.7 cm) Weight: 65.8 kg (145 lb) IBW/kg (Calculated) : 63.9 TPN AdjBW (KG): 65.8 Body mass index is 22.05 kg/m.   Assessment: 59 y/o F on chronic TPN for short gut syndrome. Patient has chronic diarrhea and consumes PO food for comfort. Patient is admitted with fever of unknown origin.   Glucose / Insulin: CBGs 82-94 (not on insulin) Electrolytes: K 3.5, mag low 1.6, Phos 2.5; other lytes wnl Renal: hx CKD, SCr trending down 1.79  LFTs / TGs: ALT mildly elevated and trending down, Tbili up 1.3; alk phos up 238 - TG 154 (8/6) --> only gets lipid in TPN 5 days a week at home Prealbumin / albumin: 25.1/ 2.9 Intake / Output; MIVF: NS @ 100, food for comfort, UOP OK GI Imaging: Surgeries / Procedures:   Central access: 6/14 TPN start date: 8/5  Nutritional Goals (per RD recommendation on 8/3): kCal: 2000-2200, Protein: 110-115, Fluid: 2L/day  Goal 16 hr cyclic TPN  (provides 99 g of protein and 2143 kcals per day)   Current Nutrition:  - cyclic TPN for 16 hrs (home formula uploaded to [place] in "media" section) PTA, cont inpatient on 8/6 - Regular diet, eats for comfort  Plan:  Magnesium sulfate 2gm IV x1 now  - Start 16 hr cyclic TPN at 0355 - Will give TPN without lipid on Wednesdays and Thursdays; TPN with lipids on all other days - Electrolytes in TPN: 42mq/L of Na, 5642m/L of K, 42m50mL of Ca, 42mE26m of Mg, and 10mm32m of Phos. Cl:Ac ratio 1:1 - Add standard MVI and trace elements to TPN - Initiate Sensitive q8h SSI  (5am, 11a, 5pm)  and adjust as needed - Reduce MIVF to 10 mL/hr at 1800 - Monitor TPN labs on Mon/Thurs - cmet, mag and phos on 8/7  Nadeem Romanoski P 07/01/2020,8:22 AM

## 2020-07-01 NOTE — Progress Notes (Signed)
Nutrition Follow-up  INTERVENTION:   -Cyclic TPN management per Pharmacy  NUTRITION DIAGNOSIS:   Increased nutrient needs related to chronic illness, altered GI function (SGS) as evidenced by estimated needs.  Ongoing  GOAL:   Patient will meet greater than or equal to 90% of their needs  Will meet with cyclic TPN  MONITOR:   Labs, Weight trends, I & O's (TPN)  REASON FOR ASSESSMENT:   Consult New TPN/TNA  ASSESSMENT:   59 y.o. female with medical history significant for dependent since 2003, frequent PICC line infections, CKD stage III, hypertension, anemia of chronic disease and iron deficiency, GERD, depression, and chronic pain who presents to the ED for evaluation of fever.  Pt's TPN resumed, 16 hr cyclic provides 0626 kcals and 99g protein.  Per chart review, pt's home TPN was providing 1584 kcals and 75g protein daily. Pt with increased needs given recurrent fevers.   Medications: KLOR-CON, Carafate, Vitamin B-12, IV Mg sulfate, IV Zofran   Labs reviewed: CBGs: 121 Low Mg Phos WNL TG: 154  Diet Order:   Diet Order            Diet regular Room service appropriate? Yes; Fluid consistency: Thin  Diet effective now                 EDUCATION NEEDS:   No education needs have been identified at this time  Skin:  Skin Assessment: Reviewed RN Assessment  Last BM:  8/3 -type 7  Height:   Ht Readings from Last 1 Encounters:  06/27/20 5' 8"  (1.727 m)    Weight:   Wt Readings from Last 1 Encounters:  06/27/20 65.8 kg    BMI:  Body mass index is 22.05 kg/m.  Estimated Nutritional Needs:   Kcal:  2000-2200  Protein:  100-115g  Fluid:  2L/day  Clayton Bibles, MS, RD, LDN Inpatient Clinical Dietitian Contact information available via Amion

## 2020-07-01 NOTE — Progress Notes (Signed)
Patient ID: Caitlin Peters, female   DOB: 08-31-1961, 59 y.o.   MRN: [id number]         [facility] for Infectious Disease  Date of Admission:  06/27/2020           Day 5 vancomycin        Day 5 piperacillin tazobactam ASSESSMENT: The source for her recurrent fevers remains uncertain.  So far all cultures have been negative.  I am concerned about the potential for nephrotoxicity with a combination of vancomycin and piperacillin tazobactam.  Her most recent blood culture isolates are all susceptible to quinolone antibiotics.  I will switch to oral levofloxacin now.  PLAN: 1. Change antibiotics to oral levofloxacin 2. Monitor temperature and await results of blood cultures  Active Problems:   Fever   Crohn's colitis ([name])   Short gut syndrome   On total parenteral nutrition (TPN)   Anemia   HTN (hypertension)   Depression   Elevated liver enzymes   Stage 3b chronic kidney disease   Scheduled Meds: . amLODipine  10 mg Oral Daily  . budesonide  9 mg Oral Daily  . buPROPion  200 mg Oral Daily  . Chlorhexidine Gluconate Cloth  6 each Topical Daily  . dicyclomine  20 mg Oral TID  . DULoxetine  90 mg Oral Daily  . estradiol  2 mg Oral Daily  . famotidine  20 mg Oral QHS  . hydrALAZINE  50 mg Oral TID  . insulin aspart  0-9 Units Subcutaneous 3 times per day  . methadone  5 mg Oral 5 X Daily  . metoprolol tartrate  100 mg Oral BID  . pantoprazole  40 mg Oral Daily  . potassium chloride SA  40 mEq Oral Daily  . sodium chloride flush  10-40 mL Intracatheter Q12H  . sucralfate  1 g Oral TID WC & HS  . vitamin B-12  1,000 mcg Oral Daily   Continuous Infusions: . sodium chloride 100 mL/hr at 07/01/20 0600  . sodium chloride Stopped (06/29/20 6389)  . sodium chloride    . piperacillin-tazobactam (ZOSYN)  IV 3.375 g (07/01/20 3734)  . TPN CYCLIC-ADULT (ION)    . vancomycin 1,000 mg (06/30/20 2056)   PRN Meds:.sodium chloride, acetaminophen,  butalbital-acetaminophen-caffeine, diphenoxylate-atropine, HYDROmorphone (DILAUDID) injection, lipase/protease/amylase, ondansetron (ZOFRAN) IV **OR** ondansetron, sodium chloride flush, traMADol   SUBJECTIVE: She was ready for discharge yesterday but then had a temperature of 102.5 degrees.  She developed diffuse myalgias.  Her antibiotics were continued.  She feels completely back to normal today.  She has not had any other new symptoms to suggest a source for the fever.  Blood cultures remain negative.  Review of Systems: Review of Systems  Constitutional: Positive for fever and malaise/fatigue. Negative for chills and diaphoresis.  Respiratory: Negative for cough and shortness of breath.   Cardiovascular: Negative for chest pain.  Gastrointestinal: Positive for diarrhea. Negative for abdominal pain, nausea and vomiting.  Genitourinary: Negative for dysuria.  Musculoskeletal: Positive for myalgias.  Skin: Negative for rash.  Neurological: Negative for headaches.    Allergies  Allergen Reactions  . Cefepime Other (See Comments)    Neurotoxicity occurring in setting of AKI. Ceftriaxone tolerated during same admit  . Gabapentin Other (See Comments)    unknown  . Hyoscyamine Hives and Swelling    Legs swelling   Disorientation  . Lyrica [Pregabalin] Other (See Comments)    unknown  . Meperidine Hives    Other reaction(s): GI Upset Due  to Chrones   . Topamax [Topiramate] Other (See Comments)    unknown  . Fentanyl Rash    Pt is allergic to fentanyl patch related to the glue (gives her a rash) Pt states she is NOT allergic to fentanyl IV medicine  . Morphine And Related Rash    OBJECTIVE: Vitals:   06/30/20 1845 06/30/20 2139 07/01/20 0203 07/01/20 0550  BP: (!) 160/69 (!) 142/59 (!) 153/67 (!) 179/68  Pulse: 74 65 63 (!) 54  Resp: 15 16 16 16   Temp: (!) 102.5 F (39.2 C) (!) 101 F (38.3 C) 99.3 F (37.4 C) 98.4 F (36.9 C)  TempSrc: Oral Oral Oral Oral  SpO2: 98%  96% 97% 98%  Weight:      Height:       Body mass index is 22.05 kg/m.  Physical Exam Constitutional:      Comments: She appears perfectly healthy.  She is alert, pleasant and talkative as usual.  Cardiovascular:     Rate and Rhythm: Normal rate and regular rhythm.     Heart sounds: No murmur heard.   Pulmonary:     Effort: Pulmonary effort is normal.     Breath sounds: Normal breath sounds.  Abdominal:     General: There is no distension.     Palpations: Abdomen is soft.     Tenderness: There is abdominal tenderness.     Comments: She says she always has some tenderness on her left abdomen.  Musculoskeletal:        General: No swelling or tenderness.  Skin:    Findings: No rash.     Comments: Her left femoral PICC site looks good.  Neurological:     General: No focal deficit present.  Psychiatric:        Mood and Affect: Mood normal.     Lab Results Lab Results  Component Value Date   WBC 10.4 07/01/2020   HGB 8.7 (L) 07/01/2020   HCT 26.1 (L) 07/01/2020   MCV 93.5 07/01/2020   PLT 117 (L) 07/01/2020    Lab Results  Component Value Date   CREATININE 1.79 (H) 07/01/2020   BUN 14 07/01/2020   NA 139 07/01/2020   K 3.5 07/01/2020   CL 108 07/01/2020   CO2 22 07/01/2020    Lab Results  Component Value Date   ALT 79 (H) 07/01/2020   AST 38 07/01/2020   ALKPHOS 238 (H) 07/01/2020   BILITOT 1.3 (H) 07/01/2020     Microbiology: Recent Results (from the past 240 hour(s))  Blood Culture (routine x 2)     Status: None (Preliminary result)   Collection Time: 06/27/20  8:00 PM   Specimen: BLOOD  Result Value Ref Range Status   Specimen Description   Final    BLOOD SITE NOT SPECIFIED Performed at [facility], [place] [address] [place], [place] [address]    Special Requests   Final    BOTTLES DRAWN AEROBIC ONLY Blood Culture results may not be optimal due to an inadequate volume of blood received in culture bottles Performed at [facility] [address], [place], [place] [address]    Culture   Final    NO GROWTH 3 DAYS Performed at [facility], [place] [address], [place], [place] [address]    Report Status PENDING  Incomplete  Blood Culture (routine x 2)     Status: None (Preliminary result)   Collection Time: 06/27/20  8:42 PM  Specimen: BLOOD  Result Value Ref Range Status   Specimen Description   Final    BLOOD LEFT ANTECUBITAL Performed at [facility] [address] [place], [place] [address]    Special Requests   Final    BOTTLES DRAWN AEROBIC AND ANAEROBIC Blood Culture adequate volume Performed at [facility] [address] [place], [place] [address]    Culture   Final    NO GROWTH 3 DAYS Performed at [facility], [place] [address], [place], [place] [address]    Report Status PENDING  Incomplete  SARS Coronavirus 2 by RT PCR (hospital order, performed in [facility] hospital lab) Nasopharyngeal Nasopharyngeal Swab     Status: None   Collection Time: 06/28/20  1:00 AM   Specimen: Nasopharyngeal Swab  Result Value Ref Range Status   SARS Coronavirus 2 NEGATIVE NEGATIVE Final    Comment: (NOTE) SARS-CoV-2 target nucleic acids are NOT DETECTED.  The SARS-CoV-2 RNA is generally detectable in upper and lower respiratory specimens during the acute phase of infection. The lowest concentration of SARS-CoV-2 viral copies this assay can detect is 250 copies / mL. A negative result does not preclude SARS-CoV-2 infection and should not be used as the sole basis for treatment or other patient management decisions.  A negative result may occur with improper specimen collection / handling, submission of specimen other than nasopharyngeal swab, presence of viral mutation(s) within the areas targeted by this assay, and inadequate number of viral copies (<250 copies / mL). A negative result must be combined with clinical observations, patient history, and  epidemiological information.  Fact Sheet for Patients:   StrictlyIdeas.no  Fact Sheet for Healthcare Providers: BankingDealers.co.za  This test is not yet approved or  cleared by the [place] FDA and has been authorized for detection and/or diagnosis of SARS-CoV-2 by FDA under an Emergency Use Authorization (EUA).  This EUA will remain in effect (meaning this test can be used) for the duration of the COVID-19 declaration under Section 564(b)(1) of the Act, 21 U.S.C. section 360bbb-3(b)(1), unless the authorization is terminated or revoked sooner.  Performed at [facility], [place] [address], [place], [place] [address]   Urine culture     Status: None   Collection Time: 06/28/20  4:50 AM   Specimen: In/Out Cath Urine  Result Value Ref Range Status   Specimen Description   Final    IN/OUT CATH URINE Performed at [facility] [address], [place], [place] [address]    Special Requests   Final    NONE Performed at [facility], [place] [address] [place], [place] [address]    Culture   Final    NO GROWTH Performed at [facility], [place] [address], [place], [place] [address]    Report Status 06/29/2020 FINAL  Final  C Difficile Quick Screen w PCR reflex     Status: None   Collection Time: 06/29/20  3:28 PM   Specimen: STOOL  Result Value Ref Range Status   C Diff antigen NEGATIVE NEGATIVE Final   C Diff toxin NEGATIVE NEGATIVE Final   C Diff interpretation No C. difficile detected.  Final    Comment: Performed at [facility], [place] [address], [place], [place] [address]    Michel Bickers, [facility] for Infectious [place] [phone] pager   [phone] cell 07/01/2020, 10:19 AM

## 2020-07-01 NOTE — Progress Notes (Signed)
PROGRESS NOTE    Caitlin Peters  JQZ:[id number] DOB: 11/10/1961 DOA: 06/27/2020 PCP: [name], [name], MD    Chief Complaint  Patient presents with  . Abdominal Pain  . Fever    Brief Narrative:  HPI per Dr. Ileene Musa on 06/27/20 Caitlin Peters a 59 y.o.femalewith medical history significant fordependent since 2003, frequent PICC line infections, CKD stage III, hypertension, anemia of chronic disease and iron deficiency, GERD, depression, and chronic pain who presents to the ED for evaluation of fever.  Starting yesterday she began to feel very fatigued and tired. Falling asleep just doing her laundry. Then today she began to notice hot and cold chills and her daughter checked her temperature and found it to be 102Fand prompted her to present to the ED since she has history of recurrent infections and is on TPN. She denies any nausea or vomiting. She has chronic diarrhea and goes about 5 times a day and this has remained unchanged. Denies any bright red blood per rectum or dark stools. However has noted more left-sided spasm abdominal pain in the last 2 days. Denies any dysuria, increased urine frequency or urgency. Denies any new cough, runny nose, chest pain or shortness of breath. She last had TPN this morning denies any issues with her line located on her left anterior thigh. She continues to take some meals by mouth but has noted decrease appetite lately.  She was last hospitalized from 05/05/20-05/10/20 where presented with fever of 120F, nausea, abdominal pain and found to have pseudomonas [place] bacteremia and was kept on IV Zosyn for 3 weeks.Her left thigh PICC line was last changed around 6/14.   ED Course:Febrile up to 100.64F andbecame hypoxic down to 88% following receiving IV Dilaudid for pain had to be placed on 2 L O2 via nasal cannula. No leukocytosis, hemoglobin with anemia remained stable. Normal lactate of 0.9. LFTs although this is chronic  from TPN use.   Subjective:   She spiked fever 102.5 yesterday afternoon, fever subsided after restarting IV antibiotics  Assessment & Plan:   Active Problems:   Crohn's colitis ([place])   Anemia   Fever   Short gut syndrome   HTN (hypertension)   Depression   On total parenteral nutrition (TPN)   Elevated liver enzymes   Stage 3b chronic kidney disease   Fever of unknown origin,  -With history ofPseudomonas Pitudabacteremia and is TPN dependent;has a PICC line in her left leg -on admission as she met SIRS criteria of having a temperature of 100.9, tachycardia as well as a tachypnea and now has a leukocytosis of 10.7 -Blood culture has been negative, UA unremarkable, chest x-ray no acute findings -C. difficile negative -CT abdomen pelvis no acute findings -She is treated vancomycin and Zosyn since admission,reports feeling better on 8/5 am, planned to discharge, however started to have shaking chills temperature start to go up in afternoon while waiting for discharge , discharge canceled , repeat blood culture no growth , UA unremarkable -Fever subsided after restarting Vanco and Zosyn -Infectious disease Dr. Megan Salon recommended change to Levaquin and monitor fever curve,  can resume TPN per Dr. 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 Domenica Fail.  Follow-up with pain clinic -CT abdomen pelvis on presentation no acute findings -Continue with budesonide 9 mg p.o. daily  Acuterespiratory failure with hypoxia following IV  opiate administration -Patient with some desaturation on 80% following IV Dilaudid. -She is on room air this morning,no hypoxia -Resolved  Chronic transaminitis/Abnormal LFTs -History of cholecystectomy -CT abdomen pelvis no acute findings -Secondary to chronic TPN use -LFT close to  baseline  Chronic pancreatic duct dilatation -Seen on previous CT abdomen pelvis imaging and is stable -Continue with Creon 36,000 units p.o. 3 times daily as needed for meals for digestion  Hypertension -ContinueAmlodipine10 mg p.o. daily metoprolol tartrate 100 mg p.o. twice daily as well as hydralazine 50 mg p.o. 3 times daily -Patient blood pressure is remaining stable   Chronic kidney disease stage IIIbwith anemia of chronic diseaseand irondeficiency -Stable creatinine. -Patient's BUNs/creatinine went from 52/1.96 is now 21/1.82 -Avoid nephrotoxic medications -Hgb stable at baseline  Chronic mild thrombocytopenia -Platelet range from 1 16-1 35,close to baseline  Chronic pain She is on methadone and as needed Dilaudid chronically She missed her pain clinic appointment due to hospitalization , she request 1 week of supply of methadone and Dilaudid at discharge , she is to follow-up with pain clinic next week.  DVT prophylaxis: SCDs Start: 06/27/20 2115  Code Status:Full Family Communication:Patient,she declined my offer to call her family Disposition:  Status is: Inpatient   Dispo: The patient is from:home Anticipated d/c is MH:DQQI nticipated d/c date is: Possible tomorrow if no fever                 Consultants:  ID Dr Megan Salon  Procedures:  none  Antimicrobials: Vanc Zosyn and Levaquin     Objective: Vitals:   06/29/20 2101 06/30/20 0604 06/30/20 1245 06/30/20 1344  BP: 132/67 (!) 158/67 (!) 146/65   Pulse: (!) 54 61 (!) 59   Resp:  16 15   Temp: 98.2 F (36.8 C) 98.3 F (36.8 C) 98.2 F (36.8 C) 99.2 F (37.3 C)  TempSrc: Oral Oral Oral Oral  SpO2: 94% 100% 96%   Weight:      Height:        Intake/Output Summary (Last 24 hours) at 06/30/2020 1455 Last data filed at 06/29/2020 1746 Gross per 24 hour  Intake 1500.77 ml  Output 400 ml  Net 1100.77 ml   Filed Weights   06/27/20 2044    Weight: 65.8 kg    Examination:  General exam: calm, NAD Respiratory system: Clear to auscultation. Respiratory effort normal. Cardiovascular system: S1 & S2 heard, RRR. No JVD, no murmur, No pedal edema. Gastrointestinal system: Abdomen is nondistended, soft and nontender. No organomegaly or masses felt. Normal bowel sounds heard. Central nervous system: Alert and oriented. No focal neurological deficits. Extremities: Symmetric 5 x 5 power. Skin: No rashes, lesions or ulcers Psychiatry: Judgement and insight appear normal. Mood & affect appropriate.     Data Reviewed: I have personally reviewed following labs and imaging studies  CBC: Recent Labs  Lab 06/27/20 2000 06/28/20 0450 06/29/20 1114  WBC 5.7 10.7* 6.8  NEUTROABS 5.0  --  5.3  HGB 9.3* 8.3* 9.3*  HCT 28.9* 25.5* 29.3*  MCV 97.0 96.2 99.3  PLT 143* 116* 135*    Basic Metabolic Panel: Recent Labs  Lab 06/27/20 2000 06/28/20 0450 06/29/20 0810 06/30/20 0555  NA 139 138  --  136  K 3.8 4.3  --  3.7  CL 99 100  --  107  CO2 28 30  --  22  GLUCOSE 76 90  --  94  BUN 52* 48*  --  21*  CREATININE 1.96* 1.93* 1.96* 1.82*  CALCIUM 9.1 8.9  --  9.1  MG  --   --   --  1.9    GFR: Estimated Creatinine Clearance: 33.6 mL/min (A) (by C-G formula based on SCr of 1.82 mg/dL (H)).  Liver Function Tests: Recent Labs  Lab 06/27/20 2000 06/28/20 0450 06/30/20 0555  AST 216* 131* 42*  ALT 164* 150* 86*  ALKPHOS 254* 205* 176*  BILITOT 1.2 1.2 0.8  PROT 7.3 6.4* 6.7  ALBUMIN 3.6 3.0* 3.0*    CBG: No results for input(s): GLUCAP in the last 168 hours.   Recent Results (from the past 240 hour(s))  Blood Culture (routine x 2)     Status: None (Preliminary result)   Collection Time: 06/27/20  8:00 PM   Specimen: BLOOD  Result Value Ref Range Status   Specimen Description   Final    BLOOD SITE NOT SPECIFIED Performed at [facility], [address], [place], [place] [address]    Special Requests    Final    BOTTLES DRAWN AEROBIC ONLY Blood Culture results may not be optimal due to an inadequate volume of blood received in culture bottles Performed at [facility] [address] [place], [place] [address]    Culture   Final    NO GROWTH 3 DAYS Performed at [facility], [place] [address], [place], [place] [address]    Report Status PENDING  Incomplete  Blood Culture (routine x 2)     Status: None (Preliminary result)   Collection Time: 06/27/20  8:42 PM   Specimen: BLOOD  Result Value Ref Range Status   Specimen Description   Final    BLOOD LEFT ANTECUBITAL Performed at [facility] [address], [place], [place] [address]    Special Requests   Final    BOTTLES DRAWN AEROBIC AND ANAEROBIC Blood Culture adequate volume Performed at [facility] [address], [place], [place] [address]    Culture   Final    NO GROWTH 3 DAYS Performed at [facility], [place] [address] [place], [place] [address]    Report Status PENDING  Incomplete  SARS Coronavirus 2 by RT PCR (hospital order, performed in [facility] hospital lab) Nasopharyngeal Nasopharyngeal Swab     Status: None   Collection Time: 06/28/20  1:00 AM   Specimen: Nasopharyngeal Swab  Result Value Ref Range Status   SARS Coronavirus 2 NEGATIVE NEGATIVE Final    Comment: (NOTE) SARS-CoV-2 target nucleic acids are NOT DETECTED.  The SARS-CoV-2 RNA is generally detectable in upper and lower respiratory specimens during the acute phase of infection. The lowest concentration of SARS-CoV-2 viral copies this assay can detect is 250 copies / mL. A negative result does not preclude SARS-CoV-2 infection and should not be used as the sole basis for treatment or other patient management decisions.  A negative result may occur with improper specimen collection / handling, submission of specimen other than nasopharyngeal swab, presence of viral mutation(s) within  the areas targeted by this assay, and inadequate number of viral copies (<250 copies / mL). A negative result must be combined with clinical observations, patient history, and epidemiological information.  Fact Sheet for Patients:   StrictlyIdeas.no  Fact Sheet for Healthcare Providers: BankingDealers.co.za  This test is not yet approved or  cleared by the [place] FDA and has been authorized for detection and/or diagnosis of SARS-CoV-2 by FDA under an Emergency Use Authorization (EUA).  This  EUA will remain in effect (meaning this test can be used) for the duration of the COVID-19 declaration under Section 564(b)(1) of the Act, 21 U.S.C. section 360bbb-3(b)(1), unless the authorization is terminated or revoked sooner.  Performed at [facility], [place] [address], [place], [place] [address]   Urine culture     Status: None   Collection Time: 06/28/20  4:50 AM   Specimen: In/Out Cath Urine  Result Value Ref Range Status   Specimen Description   Final    IN/OUT CATH URINE Performed at [facility] [address] [place], [place] [address]    Special Requests   Final    NONE Performed at [facility], [place] [address], [place], [place] [address]    Culture   Final    NO GROWTH Performed at [facility], [place] [address] [place], [place] [address]    Report Status 06/29/2020 FINAL  Final  C Difficile Quick Screen w PCR reflex     Status: None   Collection Time: 06/29/20  3:28 PM   Specimen: STOOL  Result Value Ref Range Status   C Diff antigen NEGATIVE NEGATIVE Final   C Diff toxin NEGATIVE NEGATIVE Final   C Diff interpretation No C. difficile detected.  Final    Comment: Performed at [facility], [place] [address] [place],  [address]         Radiology Studies: No results found.      Scheduled Meds: . amLODipine  10 mg Oral  Daily  . budesonide  9 mg Oral Daily  . buPROPion  200 mg Oral Daily  . dicyclomine  20 mg Oral TID  . DULoxetine  90 mg Oral Daily  . estradiol  2 mg Oral Daily  . famotidine  20 mg Oral QHS  . hydrALAZINE  50 mg Oral TID  . methadone  5 mg Oral 5 X Daily  . metoprolol tartrate  100 mg Oral BID  . pantoprazole  40 mg Oral Daily  . potassium chloride SA  40 mEq Oral Daily  . sucralfate  1 g Oral TID WC & HS  . vitamin B-12  1,000 mcg Oral Daily   Continuous Infusions: . sodium chloride 100 mL/hr at 06/29/20 1746  . sodium chloride Stopped (06/29/20 0642)     LOS: 2 days   Time spent: 77mns Greater than 50% of this time was spent in counseling, explanation of diagnosis, planning of further management, and coordination of care.  I have personally reviewed and interpreted on  06/30/2020 daily labs,  imagings as discussed above under date review session and assessment and plans.  I reviewed all nursing notes, pharmacy notes, consultant notes,  vitals, pertinent old records  I have discussed plan of care as described above with RN , patient and family on 06/30/2020  Voice Recognition /Dragon dictation system was used to create this note, attempts have been made to correct errors. Please contact the author with questions and/or clarifications.   FFlorencia Reasons MD PhD FACP [facility]  Available via Epic secure chat 7am-7pm for nonurgent issues Please page for urgent issues To page the attending provider between 7A-7P or the covering provider during after hours 7P-7A, please log into the web site [email or web address] and access using universal [place] password for that web site. If you do not have the password, please call the hospital operator.    06/30/2020, 2:55 PM

## 2020-07-02 DIAGNOSIS — K5010 Crohn's disease of large intestine without complications: Secondary | ICD-10-CM

## 2020-07-02 LAB — CULTURE, BLOOD (ROUTINE X 2)
Culture: NO GROWTH
Culture: NO GROWTH
Special Requests: ADEQUATE

## 2020-07-02 LAB — COMPREHENSIVE METABOLIC PANEL
ALT: 57 U/L — ABNORMAL HIGH (ref 0–44)
AST: 24 U/L (ref 15–41)
Albumin: 2.7 g/dL — ABNORMAL LOW (ref 3.5–5.0)
Alkaline Phosphatase: 188 U/L — ABNORMAL HIGH (ref 38–126)
Anion gap: 9 (ref 5–15)
BUN: 23 mg/dL — ABNORMAL HIGH (ref 6–20)
CO2: 19 mmol/L — ABNORMAL LOW (ref 22–32)
Calcium: 9.3 mg/dL (ref 8.9–10.3)
Chloride: 110 mmol/L (ref 98–111)
Creatinine, Ser: 1.63 mg/dL — ABNORMAL HIGH (ref 0.44–1.00)
GFR calc Af Amer: 40 mL/min — ABNORMAL LOW (ref >60)
GFR calc non Af Amer: 34 mL/min — ABNORMAL LOW (ref >60)
Glucose, Bld: 127 mg/dL — ABNORMAL HIGH (ref 70–99)
Potassium: 4.3 mmol/L (ref 3.5–5.1)
Sodium: 138 mmol/L (ref 135–145)
Total Bilirubin: 0.7 mg/dL (ref 0.3–1.2)
Total Protein: 6.4 g/dL — ABNORMAL LOW (ref 6.5–8.1)

## 2020-07-02 LAB — MAGNESIUM: Magnesium: 2.1 mg/dL (ref 1.7–2.4)

## 2020-07-02 LAB — GLUCOSE, CAPILLARY
Glucose-Capillary: 136 mg/dL — ABNORMAL HIGH (ref 70–99)
Glucose-Capillary: 106 mg/dL — ABNORMAL HIGH (ref 70–99)
Glucose-Capillary: 119 mg/dL — ABNORMAL HIGH (ref 70–99)

## 2020-07-02 LAB — PHOSPHORUS: Phosphorus: 2 mg/dL — ABNORMAL LOW (ref 2.5–4.6)

## 2020-07-02 MED ORDER — LEVOFLOXACIN 750 MG PO TABS: 750.0000 mg | ORAL_TABLET | ORAL | 0 refills | Status: AC

## 2020-07-02 MED ORDER — HEPARIN SOD (PORK) LOCK FLUSH 100 UNIT/ML IV SOLN
500.0000 [IU] | Freq: Once | INTRAVENOUS | Status: DC
  Filled 2020-07-02: qty 5

## 2020-07-02 MED ORDER — SODIUM PHOSPHATES 45 MMOLE/15ML IV SOLN
10.0000 mmol | Freq: Once | INTRAVENOUS | Status: AC
  Administered 2020-07-02: 10 mmol via INTRAVENOUS
  Filled 2020-07-02: qty 3.33

## 2020-07-02 MED ORDER — TRAVASOL 10 % IV SOLN
INTRAVENOUS | Status: DC
  Filled 2020-07-02: qty 990

## 2020-07-02 NOTE — Progress Notes (Signed)
PHARMACY - TOTAL PARENTERAL NUTRITION CONSULT NOTE   Indication: Short bowel syndrome  Patient Measurements: Height: 5' 8"  (172.7 cm) Weight: 65.8 kg (145 lb) IBW/kg (Calculated) : 63.9 TPN AdjBW (KG): 65.8 Body mass index is 22.05 kg/m.   Assessment: 59 y/o F on chronic TPN for short gut syndrome. Patient has chronic diarrhea and consumes PO food for comfort. Patient is admitted with fever of unknown origin.   Glucose / Insulin: CBGs at goal of less than 150: 121-136 (not on insulin) Electrolytes: Lytes WNL except phos low at 2 Renal: hx CKD, SCr trending down 1.63 LFTs / TGs: ALT mildly elevated and trending down, Tbili 0.7 improved; alk phos 188 improved - TG 154 (8/6) --> only gets lipid in TPN 5 days a week at home Prealbumin / albumin: 25.1/ 2.9 Intake / Output; MIVF: NS @ KVO, food for comfort, UOP OK GI Imaging: Surgeries / Procedures:   Central access: 6/14 TPN start date: 8/5  Nutritional Goals (per RD recommendation on 8/3): kCal: 2000-2200, Protein: 110-115, Fluid: 2L/day  Goal 16 hr cyclic TPN  (provides 99 g of protein and 2143 kcals per day)   Current Nutrition:  - cyclic TPN for 16 hrs (home formula uploaded to [place] in "media" section) PTA, cont inpatient on 8/6 - Regular diet, eats for comfort  Plan:  NaPhos 53mol x 1 this AM  - Start 16 hr cyclic TPN at [address]- Will give TPN without lipid on Wednesdays and Thursdays; TPN with lipids on all other days - Electrolytes in TPN: 593m/L of Na, 5019mL of K, 5mE10m of Ca, 5mEq8mof Mg, and 15mmo25mof Phos. Cl:Ac ratio 1:2 - Add standard MVI and trace elements to TPN - Initiate Sensitive q8h SSI  (5am, 11a, 5pm)  and adjust as needed - Continue MIVF at 10 mL/hr at 1800 - Monitor TPN labs on Mon/Thurs   Jovonne Wilton,Kara Mead021,9:52 AM

## 2020-07-02 NOTE — Progress Notes (Signed)
Patient discharged to home with family, discharge instructions reviewed with patient who verbalized understanding.

## 2020-07-02 NOTE — Progress Notes (Signed)
Patient ID: Caitlin Peters, female   DOB: 1961/10/28, 59 y.o.   MRN: [id number]         [facility] for Infectious Disease  Date of Admission:  06/27/2020           Day 6 antibiotics        Day to levofloxacin ASSESSMENT: The source for her recurrent fevers remains uncertain.  All blood cultures remain negative.  She is clinically well.  I recommend discharge home with 4 more days of oral levofloxacin.  PLAN: 1. Discharge home on levofloxacin 2. She has follow-up scheduled in our clinic next week  Active Problems:   Fever   Crohn's colitis ([place])   Short gut syndrome   On total parenteral nutrition (TPN)   Anemia   HTN (hypertension)   Depression   Elevated liver enzymes   Stage 3b chronic kidney disease   Scheduled Meds: . amLODipine  10 mg Oral Daily  . budesonide  9 mg Oral Daily  . buPROPion  200 mg Oral Daily  . Chlorhexidine Gluconate Cloth  6 each Topical Daily  . dicyclomine  20 mg Oral TID  . DULoxetine  90 mg Oral Daily  . estradiol  2 mg Oral Daily  . famotidine  20 mg Oral QHS  . hydrALAZINE  75 mg Oral TID  . insulin aspart  0-9 Units Subcutaneous 3 times per day  . levofloxacin  750 mg Oral Q48H  . methadone  5 mg Oral 5 X Daily  . metoprolol tartrate  100 mg Oral BID  . pantoprazole  40 mg Oral Daily  . potassium chloride SA  40 mEq Oral Daily  . sodium chloride flush  10-40 mL Intracatheter Q12H  . sucralfate  1 g Oral TID WC & HS  . vitamin B-12  1,000 mcg Oral Daily   Continuous Infusions: . sodium chloride Stopped (06/29/20 4097)  . sodium chloride    . sodium phosphate  Dextrose 5% IVPB    . TPN CYCLIC-ADULT (ION)     PRN Meds:.sodium chloride, acetaminophen, butalbital-acetaminophen-caffeine, diphenoxylate-atropine, HYDROmorphone (DILAUDID) injection, lipase/protease/amylase, loperamide, ondansetron (ZOFRAN) IV **OR** ondansetron, sodium chloride flush, traMADol   SUBJECTIVE: She feels well.  She is eager to go home.  Review of  Systems: Review of Systems  Constitutional: Negative for chills, diaphoresis, fever and malaise/fatigue.  Respiratory: Negative for cough and shortness of breath.   Cardiovascular: Negative for chest pain.  Gastrointestinal: Positive for diarrhea. Negative for abdominal pain, nausea and vomiting.  Genitourinary: Negative for dysuria.  Skin: Negative for rash.  Neurological: Negative for headaches.    Allergies  Allergen Reactions  . Cefepime Other (See Comments)    Neurotoxicity occurring in setting of AKI. Ceftriaxone tolerated during same admit  . Gabapentin Other (See Comments)    unknown  . Hyoscyamine Hives and Swelling    Legs swelling   Disorientation  . Lyrica [Pregabalin] Other (See Comments)    unknown  . Meperidine Hives    Other reaction(s): GI Upset Due to [place]   . Topamax [Topiramate] Other (See Comments)    unknown  . Fentanyl Rash    Pt is allergic to fentanyl patch related to the glue (gives her a rash) Pt states she is NOT allergic to fentanyl IV medicine  . Morphine And Related Rash    OBJECTIVE: Vitals:   07/01/20 0550 07/01/20 1300 07/01/20 2127 07/02/20 0618  BP: (!) 179/68 (!) 180/70 (!) 144/64 (!) 167/70  Pulse: (!) 54 60 (!)  58 (!) 55  Resp: 16 18 16 15   Temp: 98.4 F (36.9 C) 98.6 F (37 C) 98.5 F (36.9 C) 98.5 F (36.9 C)  TempSrc: Oral Oral Oral Oral  SpO2: 98% 95% 97% 98%  Weight:      Height:       Body mass index is 22.05 kg/m.  Physical Exam Constitutional:      Comments: She is pleasant and talkative as usual.  Cardiovascular:     Rate and Rhythm: Normal rate and regular rhythm.     Heart sounds: No murmur heard.   Pulmonary:     Effort: Pulmonary effort is normal.     Breath sounds: Normal breath sounds.  Abdominal:     General: There is no distension.     Palpations: Abdomen is soft.     Tenderness: There is abdominal tenderness.     Comments: She says she always has some tenderness on her left abdomen.    Musculoskeletal:        General: No swelling or tenderness.  Skin:    Findings: No rash.     Comments: Her left femoral PICC site looks good.  Neurological:     General: No focal deficit present.  Psychiatric:        Mood and Affect: Mood normal.     Lab Results Lab Results  Component Value Date   WBC 10.4 07/01/2020   HGB 8.7 (L) 07/01/2020   HCT 26.1 (L) 07/01/2020   MCV 93.5 07/01/2020   PLT 117 (L) 07/01/2020    Lab Results  Component Value Date   CREATININE 1.63 (H) 07/02/2020   BUN 23 (H) 07/02/2020   NA 138 07/02/2020   K 4.3 07/02/2020   CL 110 07/02/2020   CO2 19 (L) 07/02/2020    Lab Results  Component Value Date   ALT 57 (H) 07/02/2020   AST 24 07/02/2020   ALKPHOS 188 (H) 07/02/2020   BILITOT 0.7 07/02/2020     Microbiology: Recent Results (from the past 240 hour(s))  Blood Culture (routine x 2)     Status: None   Collection Time: 06/27/20  8:00 PM   Specimen: BLOOD  Result Value Ref Range Status   Specimen Description   Final    BLOOD SITE NOT SPECIFIED Performed at [facility], [address], [place], [place] [address]    Special Requests   Final    BOTTLES DRAWN AEROBIC ONLY Blood Culture results may not be optimal due to an inadequate volume of blood received in culture bottles Performed at [facility] [address] [place], [place] [address]    Culture   Final    NO GROWTH 5 DAYS Performed at [facility], [place] [address] [place], [place] [address]    Report Status 07/02/2020 FINAL  Final  Blood Culture (routine x 2)     Status: None   Collection Time: 06/27/20  8:42 PM   Specimen: BLOOD  Result Value Ref Range Status   Specimen Description   Final    BLOOD LEFT ANTECUBITAL Performed at [facility] [address] [place], [place] [address]    Special Requests   Final    BOTTLES DRAWN AEROBIC AND ANAEROBIC Blood Culture adequate volume Performed at [facility] [address] [place], [place] [address]    Culture   Final    NO GROWTH 5 DAYS Performed at [facility], [address] [place], [place] [address]    Report Status 07/02/2020 FINAL  Final  SARS Coronavirus 2 by RT PCR (hospital order, performed in [facility] hospital lab) Nasopharyngeal Nasopharyngeal Swab     Status: None   Collection Time: 06/28/20  1:00 AM   Specimen: Nasopharyngeal Swab  Result Value Ref Range Status   SARS Coronavirus 2 NEGATIVE NEGATIVE Final    Comment: (NOTE) SARS-CoV-2 target nucleic acids are NOT DETECTED.  The SARS-CoV-2 RNA is generally detectable in upper and lower respiratory specimens during the acute phase of infection. The lowest concentration of SARS-CoV-2 viral copies this assay can detect is 250 copies / mL. A negative result does not preclude SARS-CoV-2 infection and should not be used as the sole basis for treatment or other patient management decisions.  A negative result may occur with improper specimen collection / handling, submission of specimen other than nasopharyngeal swab, presence of viral mutation(s) within the areas targeted by this assay, and inadequate number of viral copies (<250 copies / mL). A negative result must be combined with clinical observations, patient history, and epidemiological information.  Fact Sheet for Patients:   StrictlyIdeas.no  Fact Sheet for Healthcare Providers: BankingDealers.co.za  This test is not yet approved or  cleared by the [place] FDA and has been authorized for detection and/or diagnosis of SARS-CoV-2 by FDA under an Emergency Use Authorization (EUA).  This EUA will remain in effect (meaning this test can be used) for the duration of the COVID-19 declaration under Section 564(b)(1) of the Act, 21 U.S.C. section 360bbb-3(b)(1), unless the authorization is terminated or revoked sooner.  Performed at [facility], [place] [address] [place], [place] [address]   Urine culture     Status: None   Collection Time: 06/28/20  4:50 AM   Specimen: In/Out Cath Urine  Result Value Ref Range Status   Specimen Description   Final    IN/OUT CATH URINE Performed at [facility] [address] [place], [place] [address]    Special Requests   Final    NONE Performed at [facility], [place] [address], [place], [place] [address]    Culture   Final    NO GROWTH Performed at [facility], [place] [address], [place], [place] [address]    Report Status 06/29/2020 FINAL  Final  C Difficile Quick Screen w PCR reflex     Status: None   Collection Time: 06/29/20  3:28 PM   Specimen: STOOL  Result Value Ref Range Status   C Diff antigen NEGATIVE NEGATIVE Final   C Diff toxin NEGATIVE NEGATIVE Final   C Diff interpretation No C. difficile detected.  Final    Comment: Performed at [facility], [place] [address] [place], [place] [address]  Culture, blood (routine x 2)     Status: None (Preliminary result)   Collection Time: 06/30/20  3:47 PM   Specimen: BLOOD  Result Value Ref Range Status   Specimen Description   Final    BLOOD RIGHT HAND Performed at [facility] [address] [place], [place] [address]    Special Requests   Final    BOTTLES DRAWN AEROBIC ONLY Blood Culture adequate volume Performed at [facility] [address] [place], [place] [address]    Culture   Final    NO GROWTH 2 DAYS Performed at [facility] [address], [place], [place] [address]  Report Status PENDING  Incomplete  Culture, blood (routine x 2)     Status: None (Preliminary result)   Collection Time: 06/30/20  3:47 PM   Specimen: BLOOD  Result Value Ref Range Status   Specimen Description   Final    BLOOD LEFT WRIST Performed at [facility] [address], [place],  [place] [address]    Special Requests   Final    BOTTLES DRAWN AEROBIC ONLY Blood Culture adequate volume Performed at [place] [facility] [address], [place], [place] [address]    Culture   Final    NO GROWTH 2 DAYS Performed at [facility] [address] [place], [place] [address]    Report Status PENDING  Incomplete    Michel Bickers, MD [facility] [place] [facility] [phone] pager   [phone] cell 07/02/2020, 10:44 AM

## 2020-07-02 NOTE — Discharge Summary (Addendum)
Discharge Summary  Caitlin Peters KDT:[id number] DOB: 14-Feb-1961  PCP: [name], [name], MD  Admit date: 06/27/2020 Discharge date: 07/02/2020  Time spent: 3mns, more than 50% time spent on coordination of care.   Recommendations for Outpatient Follow-up:  1. F/u with PCP within a week  for hospital discharge follow up, repeat cbc/bmp at follow up 2. Follow-up with infectious disease Dr. VLucianne Leidam in 1 week 3. Follow-up with [facility] in 1 week 4. Resume home health  Discharge Diagnoses:  Active Hospital Problems   Diagnosis Date Noted  . Stage 3b chronic kidney disease 03/05/2020  . On total parenteral nutrition (TPN) 01/02/2020  . Elevated liver enzymes 01/02/2020  . Short gut syndrome   . HTN (hypertension)   . Depression   . Fever   . Crohn's colitis ([facility] 10/12/2019  . Anemia 10/12/2019    Resolved Hospital Problems  No resolved problems to display.    Discharge Condition: stable  Diet recommendation: Regular diet, continue home TPN  Filed Weights   06/27/20 2044  Weight: 65.8 kg    History of present illness: (Per admitting MD Dr.  CIleene Musa Chief Complaint: Fever and fatigue  HPI: Caitlin GLAHNis a 59y.o. female with medical history significant for dependent since 2003, frequent PICC line infections, CKD stage III, hypertension, anemia of chronic disease and iron deficiency, GERD, depression, and chronic pain who presents to the ED for evaluation of fever.  Starting yesterday she began to feel very fatigued and tired.  Falling asleep just doing her laundry.  Then today she began to notice hot and cold chills and her daughter checked her temperature and found it to be 102F and prompted her to present to the ED since she has history of recurrent infections and is on TPN.  She denies any nausea or vomiting.  She has chronic diarrhea and goes about 5 times a day and this has remained unchanged.  Denies any bright red blood per rectum or dark stools.   However has noted more left-sided spasm abdominal pain in the last 2 days.  Denies any dysuria, increased urine frequency or urgency.  Denies any new cough, runny nose, chest pain or shortness of breath. She last had TPN this morning denies any issues with her line located on her left anterior thigh.  She continues to take some meals by mouth but has noted decrease appetite lately.  She was last hospitalized from 05/05/20-05/10/20 where presented with fever of 120F, nausea, abdominal pain and found to have pseudomonas [place] bacteremia and was kept on IV Zosyn for 3 weeks.  Her left thigh PICC line was last changed around 6/14.   ED Course: Febrile up to 100.60F and became hypoxic down to 88% following receiving IV Dilaudid for pain had to be placed on 2 L O2 via nasal cannula. No leukocytosis, hemoglobin with anemia remained stable.  Normal lactate of 0.9.  LFTs although this is chronic from TPN use.  CT abdomen pelvis with no acute findings.  Hospital Course:  Active Problems:   Crohn's colitis ([facility]   Anemia   Fever   Short gut syndrome   HTN (hypertension)   Depression   On total parenteral nutrition (TPN)   Elevated liver enzymes   Stage 3b chronic kidney disease   Fever of unknown origin, no source identified,  But spiked high fever after stopping antibiotics, fever resolved after restarting antibiotics -With history ofPseudomonas Pitudabacteremia and is TPN dependent;has a PICC line in  her left leg -on admission as she met SIRS criteria of having a temperature of 100.9, tachycardia as well as a tachypnea and now has a leukocytosis of 10.7 -Blood culture has been negative,UA unremarkable,chest x-ray no acute findings -C. difficile negative -CT abdomen pelvis no acute findings -She is treated vancomycin and Zosyn since admission,reports feeling betteron 8/5 am, planned to discharge, however started to have shaking chills temperature start to go up in afternoon while waiting  for discharge , discharge canceled , repeat blood culture no growth , UA unremarkable -Fever subsided after restarting Vanco and Zosyn -Infectious disease Dr. Megan Salon recommended change to Levaquin , no reoccurring fever after changing to oral antibiotics , she is cleared to discharge home on oral Levaquin every 48 hours for 4 days by infectious disease . - resume home TPN per ID recommendation -She is going to follow-up with infectious disease Dr. 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 Domenica Fail.  Follow-up with pain clinic -CT abdomen pelvis on presentation no acute findings -Continue with budesonide 9 mg p.o. daily -Report chronic diarrhea, C. difficile negative  Hypophosphatemia: Replaced  Hypomagnesemia; replaced  Acuterespiratory failure with hypoxia following IV opiate administration -Patient with some desaturation on 80% following IV Dilaudid. -She is on room air this morning , no hypoxia -Resolved  Chronic transaminitis/Abnormal LFTs -likely Secondary to chronic TPN use -CT ab on presentation showed :  "Hepatobiliary: No focal liver abnormality is seen. Status post cholecystectomy. Moderate severity pneumobilia is noted. This is seen on the prior study." -Total bilirubin 1.2, AST 216, ALT 164, alk phos 254 on presentation -Total bilirubin 0.7, AST 24, ALT 57, alk phos 188 at discharge  Chronic pancreatic duct dilatation -Seen on previous CT abdomen pelvis imaging and is stable -Continue with Creon 36,000 units p.o. 3 times daily as needed for meals for digestion  Hypertension -ContinueAmlodipine10 mg p.o. daily metoprolol tartrate 100 mg p.o. twice daily as well as hydralazine 50 mg p.o. 3 times daily -Patient blood pressure is remaining stable   AKI on Chronic kidney disease  stage IIIb with anemia of chronic disease and iron deficiency --Patient's BUNs/creatinine  52/1.96 on presentation , BUN/creatinine 23/1.63 at discharge -Avoid nephrotoxic medications -Hgb stable at baseline  Chronic mild thrombocytopenia -Platelet range from 1 16-1 35, close to baseline  Chronic pain She is on methadone and as needed Dilaudid chronically She missed her pain clinic appointment due to hospitalization , she request 1 week of supply of methadone and Dilaudid at discharge , she is to follow-up with pain clinic next week.  DVT prophylaxis: SCDs Start: 06/27/20 2115  Code Status: Full Family Communication: Patient, she declined my offer to call her family Disposition:   Status is: Inpatient   Dispo: The patient is from: home with home health  Anticipated d/c is to: home with home health    Consultants:   ID Dr Megan Salon   Procedures:   none  Antimicrobials:   Vanc Zosyn and Levaquin   Discharge Exam: BP (!) 167/70 (BP Location: Left Arm)   Pulse (!) 55   Temp 98.5 F (36.9 C) (Oral)   Resp 15   Ht 5' 8"  (1.727 m)   Wt 65.8 kg   SpO2 98%   BMI 22.05 kg/m   General: NAD, pleasant Cardiovascular: RRR Respiratory: Clear to auscultation bilaterally  Discharge Instructions You were cared for by a hospitalist during your hospital stay. If you have any questions about your discharge medications or the care you received while you were in the hospital after you are discharged, you can call the unit and asked to speak with the hospitalist on call if the hospitalist that took care of you is not available. Once you are discharged, your primary care physician will handle any further medical issues. Please note that NO REFILLS for any discharge medications will be authorized once you are discharged, as it is imperative that you return to your primary care physician (or establish a relationship with a primary care physician if you do  not have one) for your aftercare needs so that they can reassess your need for medications and monitor your lab values.  Discharge Instructions    Increase activity slowly   Complete by: As directed      Allergies as of 07/02/2020      Reactions   Cefepime Other (See Comments)   Neurotoxicity occurring in setting of AKI. Ceftriaxone tolerated during same admit   Gabapentin Other (See Comments)   unknown   Hyoscyamine Hives, Swelling   Legs swelling Disorientation   Lyrica [pregabalin] Other (See Comments)   unknown   Meperidine Hives   Other reaction(s): GI Upset Due to Chrones   Topamax [topiramate] Other (See Comments)   unknown   Fentanyl Rash   Pt is allergic to fentanyl patch related to the glue (gives her a rash) Pt states she is NOT allergic to fentanyl IV medicine   Morphine And Related Rash      Medication List    TAKE these medications   acetaminophen 325 MG tablet Commonly known as: TYLENOL Take 650 mg by mouth every 6 (six) hours as needed for mild pain.   amLODipine 10 MG tablet Commonly known as: NORVASC Take 1 tablet (10 mg total) by mouth daily.   budesonide 3 MG 24 hr capsule Commonly known as: ENTOCORT EC TAKE 3 CAPSULES BY MOUTH ONCE DAILY What changed: how much to take   buPROPion 100 MG 12 hr tablet Commonly known as: WELLBUTRIN SR Take 2 tablets (200 mg total) by mouth daily.   calcium carbonate 500 MG chewable tablet Commonly known as: TUMS - dosed in mg elemental calcium Chew 1 tablet by mouth 3 (three) times daily as needed for indigestion or heartburn.   cholecalciferol 25 MCG (1000 UNIT) tablet Commonly known as: VITAMIN D3 Take 1,000 Units by mouth daily.   cycloSPORINE 0.05 % ophthalmic emulsion Commonly known as: RESTASIS Place 1 drop into both eyes 2 (two) times daily.   Dexilant 60 MG capsule Generic drug: dexlansoprazole Take 1 capsule (60 mg total) by mouth daily.   Dextran 70-Hypromellose 0.1-0.3 % Soln Place 1 drop  into both eyes 4 (four) times daily.   dicyclomine 20 MG tablet Commonly known as: BENTYL Take 20 mg by mouth 3 (three) times daily.   diphenoxylate-atropine 2.5-0.025 MG tablet Commonly known as: LOMOTIL Take 1 tablet by mouth 4 (four) times daily as needed for diarrhea or loose stools.   DULoxetine 30 MG capsule Commonly known as: CYMBALTA Take 3 capsules (90 mg total) by mouth daily.   estradiol 2 MG tablet Commonly known as: ESTRACE Take 1 tablet (2 mg total) by mouth daily.   famotidine 20 MG tablet Commonly known as: PEPCID Take 20 mg by mouth at bedtime.   hydrALAZINE 50 MG tablet Commonly known as: APRESOLINE Take 1  tablet (50 mg total) by mouth 3 (three) times daily. What changed: Another medication with the same name was removed. Continue taking this medication, and follow the directions you see here.   HYDROmorphone 4 MG tablet Commonly known as: Dilaudid Take 1 tablet (4 mg total) by mouth every 6 (six) hours as needed for severe pain.   levofloxacin 750 MG tablet Commonly known as: LEVAQUIN Take 1 tablet (750 mg total) by mouth every other day for 4 days. Start taking on: July 03, 2020   lipase/protease/amylase 36000 UNITS Cpep capsule Commonly known as: Creon Take 1 capsule (36,000 Units total) by mouth 3 (three) times daily as needed (with meals for digestion).   loperamide 2 MG capsule Commonly known as: IMODIUM TAKE 1 CAPSULE BY MOUTH AS NEEDED FOR DIARRHEA OR  LOOSE  STOOLS What changed: See the new instructions.   methadone 5 MG tablet Commonly known as: DOLOPHINE Take 1 tablet (5 mg total) by mouth 5 (five) times daily.   metoprolol tartrate 100 MG tablet Commonly known as: LOPRESSOR Take 1 tablet (100 mg total) by mouth 2 (two) times daily.   MULTIVITAMIN ADULT PO Take 1 tablet by mouth daily.   Omega-3 1000 MG Caps Take 1,000 mg by mouth daily.   potassium chloride SA 20 MEQ tablet Commonly known as: KLOR-CON Take 2 tablets (40 mEq  total) by mouth daily.   PRESCRIPTION MEDICATION Inject 1 each into the vein daily. Home TPN . [facility] . 1 bag for 16 hours. [phone]   PROBIOTIC-10 PO Take 1 capsule by mouth daily.   promethazine 25 MG tablet Commonly known as: PHENERGAN Take 1 tablet (25 mg total) by mouth every 6 (six) hours as needed for nausea or vomiting. TAKE 1 TABLET BY MOUTH EVERY 6 HOURS AS NEEDED FOR NAUSEA AND VOMITING What changed: See the new instructions.   sucralfate 1 GM/10ML suspension Commonly known as: Carafate Take 10 mLs (1 g total) by mouth 4 (four) times daily -  with meals and at bedtime. What changed: when to take this   traZODone 100 MG tablet Commonly known as: DESYREL Take 2 tablets (200 mg total) by mouth at bedtime as needed for sleep.   vitamin B-12 100 MCG tablet Commonly known as: CYANOCOBALAMIN Take 1,000 mcg by mouth daily.      Allergies  Allergen Reactions  . Cefepime Other (See Comments)    Neurotoxicity occurring in setting of AKI. Ceftriaxone tolerated during same admit  . Gabapentin Other (See Comments)    unknown  . Hyoscyamine Hives and Swelling    Legs swelling   Disorientation  . Lyrica [Pregabalin] Other (See Comments)    unknown  . Meperidine Hives    Other reaction(s): GI Upset Due to Chrones   . Topamax [Topiramate] Other (See Comments)    unknown  . Fentanyl Rash    Pt is allergic to fentanyl patch related to the glue (gives her a rash) Pt states she is NOT allergic to fentanyl IV medicine  . Morphine And Related Rash    Follow-up Information    [name], [name], MD Follow up in 1 week(s).   Specialty: Internal Medicine Contact information: [place] [address] [phone]        [facility]              Follow up in 1 week(s).   Contact information: [place]  [place] [phone]                The results of significant diagnostics from this hospitalization (including imaging, microbiology, ancillary and laboratory) are listed below for reference.    Significant Diagnostic Studies: CT ABDOMEN PELVIS WO CONTRAST  Result Date: 06/27/2020 CLINICAL DATA:  Fever x1 day with abdominal pain. EXAM: CT ABDOMEN AND PELVIS WITHOUT CONTRAST TECHNIQUE: Multidetector CT imaging of the abdomen and pelvis was performed following the standard protocol without IV contrast. COMPARISON:  May 05, 2020 FINDINGS: Lower chest: No acute abnormality. Hepatobiliary: No focal liver abnormality is seen. Status post cholecystectomy. Moderate severity pneumobilia is noted. This is seen on the prior study. Pancreas: There is stable dilatation of the pancreatic duct. The pancreatic parenchyma is normal in appearance. Spleen: Normal in size without focal abnormality. Adrenals/Urinary Tract: Adrenal glands are unremarkable. Kidneys are normal, without renal calculi, focal lesion, or hydronephrosis. Bladder is unremarkable. Stomach/Bowel: Stomach is within normal limits. The appendix is not identified. Surgically anastomosed bowel is seen within the mid and lower abdomen. No evidence of bowel dilatation. Vascular/Lymphatic: No significant vascular findings are present. The venous catheter is seen entering via the left groin. This is unchanged in position when compared to the prior exam. No enlarged abdominal or pelvic lymph nodes. Reproductive: Status post hysterectomy. No adnexal masses. Other: No abdominal wall hernia or abnormality. No abdominopelvic ascites. Musculoskeletal: A stable 1.6 cm x 0.8 cm sclerotic focus is seen adjacent to the anterolateral aspect of the left acetabulum. No acute osseous abnormalities are identified. IMPRESSION: 1. Evidence of prior cholecystectomy. 2. Stable dilatation of the pancreatic duct. 3. Stable sclerotic focus adjacent to the anterolateral aspect of the left acetabulum. This may  represent a bone island. Electronically Signed   By: Virgina Norfolk M.D.   On: 06/27/2020 20:23   DG Chest Port 1 View  Result Date: 06/27/2020 CLINICAL DATA:  Fever.  Abdominal pain.  Headache. EXAM: PORTABLE CHEST 1 VIEW COMPARISON:  None. FINDINGS: Heart size is normal. Surgical clips project over the right hilum and chest. These are stable. No edema or effusion is present. No airspace disease is evident. The visualized soft tissues and bony thorax are unremarkable. IMPRESSION: No acute cardiopulmonary disease or significant interval change. Electronically Signed   By: San Morelle M.D.   On: 06/27/2020 20:16    Microbiology: Recent Results (from the past 240 hour(s))  Blood Culture (routine x 2)     Status: None   Collection Time: 06/27/20  8:00 PM   Specimen: BLOOD  Result Value Ref Range Status   Specimen Description   Final    BLOOD SITE NOT SPECIFIED Performed at [facility], [address] [place], [place] [address]    Special Requests   Final    BOTTLES DRAWN AEROBIC ONLY Blood Culture results may not be optimal due to an inadequate volume of blood received in culture bottles Performed at [facility] [address] [place], [place] [address]    Culture   Final    NO GROWTH 5 DAYS Performed at [facility], [place] [address] [place], [place] [address]    Report Status 07/02/2020 FINAL  Final  Blood Culture (routine x 2)     Status: None   Collection Time: 06/27/20  8:42 PM   Specimen: BLOOD  Result Value Ref Range Status   Specimen Description   Final    BLOOD LEFT ANTECUBITAL Performed at [facility], [place] [address] [place], [place] [address]    Special Requests   Final    BOTTLES DRAWN AEROBIC AND ANAEROBIC Blood Culture adequate volume Performed at [facility] [address], [place], [place] [address]    Culture   Final    NO GROWTH 5 DAYS Performed at [facility],  [place] [address] [place], [place] [address]    Report Status 07/02/2020 FINAL  Final  SARS Coronavirus 2 by RT PCR (hospital order, performed in [facility] hospital lab) Nasopharyngeal Nasopharyngeal Swab     Status: None   Collection Time: 06/28/20  1:00 AM   Specimen: Nasopharyngeal Swab  Result Value Ref Range Status   SARS Coronavirus 2 NEGATIVE NEGATIVE Final    Comment: (NOTE) SARS-CoV-2 target nucleic acids are NOT DETECTED.  The SARS-CoV-2 RNA is generally detectable in upper and lower respiratory specimens during the acute phase of infection. The lowest concentration of SARS-CoV-2 viral copies this assay can detect is 250 copies / mL. A negative result does not preclude SARS-CoV-2 infection and should not be used as the sole basis for treatment or other patient management decisions.  A negative result may occur with improper specimen collection / handling, submission of specimen other than nasopharyngeal swab, presence of viral mutation(s) within the areas targeted by this assay, and inadequate number of viral copies (<250 copies / mL). A negative result must be combined with clinical observations, patient history, and epidemiological information.  Fact Sheet for Patients:   StrictlyIdeas.no  Fact Sheet for Healthcare Providers: BankingDealers.co.za  This test is not yet approved or  cleared by the [place] FDA and has been authorized for detection and/or diagnosis of SARS-CoV-2 by FDA under an Emergency Use Authorization (EUA).  This EUA will remain in effect (meaning this test can be used) for the duration of the COVID-19 declaration under Section 564(b)(1) of the Act, 21 U.S.C. section 360bbb-3(b)(1), unless the authorization is terminated or revoked sooner.  Performed at [facility], [place] [address], [place], [place] [address]   Urine culture     Status: None   Collection Time: 06/28/20  4:50 AM    Specimen: In/Out Cath Urine  Result Value Ref Range Status   Specimen Description   Final    IN/OUT CATH URINE Performed at [facility] [address] [place], [place] [address]    Special Requests   Final    NONE Performed at [facility], [place] [address], [place], [place] [address]    Culture   Final    NO GROWTH Performed at [facility], [place] [address] [place], [place] [address]    Report Status 06/29/2020 FINAL  Final  C Difficile Quick Screen w PCR reflex     Status: None   Collection Time: 06/29/20  3:28 PM   Specimen: STOOL  Result Value Ref Range Status   C Diff antigen NEGATIVE NEGATIVE Final   C Diff toxin NEGATIVE NEGATIVE Final   C Diff interpretation No C. difficile detected.  Final    Comment: Performed at [facility], [place] [address] [place],  [address]  Culture, blood (routine x 2)     Status: None (Preliminary result)   Collection Time: 06/30/20  3:47 PM   Specimen: BLOOD  Result Value Ref Range Status   Specimen Description   Final    BLOOD RIGHT HAND Performed at [facility]  [address] [place], [place] [address]    Special Requests   Final    BOTTLES DRAWN AEROBIC ONLY Blood Culture adequate volume Performed at [facility] [address], [place], [place] [address]    Culture   Final    NO GROWTH 2 DAYS Performed at [facility] [address] [place], [place] [address]    Report Status PENDING  Incomplete  Culture, blood (routine x 2)     Status: None (Preliminary result)   Collection Time: 06/30/20  3:47 PM   Specimen: BLOOD  Result Value Ref Range Status   Specimen Description   Final    BLOOD LEFT WRIST Performed at [facility] [address], [place], [place] [address]    Special Requests   Final    BOTTLES DRAWN AEROBIC ONLY Blood Culture adequate volume Performed at [facility] [address], [place], [place] [address]    Culture   Final    NO GROWTH 2 DAYS Performed at [facility] [address], [place], [place] [address]    Report Status PENDING  Incomplete     Labs: Basic Metabolic Panel: Recent Labs  Lab 06/27/20 2000 06/27/20 2000 06/28/20 0450 06/29/20 0810 06/30/20 0555 07/01/20 0544 07/02/20 0600  NA 139  --  138  --  136 139 138  K 3.8  --  4.3  --  3.7 3.5 4.3  CL 99  --  100  --  107 108 110  CO2 28  --  30  --  22 22 19*  GLUCOSE 76  --  90  --  94 82 127*  BUN 52*  --  48*  --  21* 14 23*  CREATININE 1.96*   < > 1.93* 1.96* 1.82* 1.79* 1.63*  CALCIUM 9.1  --  8.9  --  9.1 8.9 9.3  MG  --   --   --   --  1.9 1.6* 2.1  PHOS  --   --   --   --   --  2.5 2.0*   < > = values in this interval not displayed.   Liver Function Tests: Recent Labs  Lab 06/27/20 2000 06/28/20 0450 06/30/20 0555 07/01/20 0544 07/02/20 0600  AST 216* 131* 42* 38 24  ALT 164* 150* 86* 79* 57*  ALKPHOS 254* 205* 176* 238* 188*  BILITOT 1.2 1.2 0.8 1.3* 0.7  PROT 7.3 6.4* 6.7 6.7 6.4*  ALBUMIN 3.6 3.0* 3.0* 2.9* 2.7*   Recent Labs  Lab 06/27/20 2000  LIPASE 20   No results for input(s): AMMONIA in the last 168 hours. CBC: Recent Labs  Lab 06/27/20 2000 06/28/20 0450 06/29/20 1114 07/01/20 0544  WBC 5.7 10.7* 6.8 10.4  NEUTROABS 5.0  --  5.3 8.7*  HGB 9.3* 8.3* 9.3* 8.7*  HCT 28.9* 25.5* 29.3* 26.1*  MCV 97.0 96.2 99.3 93.5  PLT 143* 116* 135* 117*   Cardiac Enzymes: No results for input(s): CKTOTAL, CKMB, CKMBINDEX, TROPONINI in the last 168 hours. BNP: BNP (last 3 results) Recent Labs    10/12/19 2059 10/28/19 0435 11/26/19 0209  BNP 253.9* 162.4* 179.1*    ProBNP (last 3 results) No results for input(s): PROBNP in the last 8760 hours.  CBG: Recent Labs  Lab 07/01/20 1054 07/01/20 1724 07/01/20 2130 07/02/20 0634 07/02/20 1033  GLUCAP 121* 127* 130* 136* 106*       Signed:  Florencia Reasons MD, PhD,  Rosalita Chessman  [facility] 07/02/2020, 12:40 PM

## 2020-07-04 ENCOUNTER — Telehealth: Payer: Self-pay | Admitting: *Deleted

## 2020-07-04 ENCOUNTER — Telehealth: Payer: Self-pay | Admitting: Internal Medicine

## 2020-07-04 ENCOUNTER — Telehealth: Payer: Self-pay

## 2020-07-04 MED ORDER — LOPERAMIDE HCL 2 MG PO CAPS: ORAL_CAPSULE | ORAL | 0 refills | Status: DC

## 2020-07-04 NOTE — Telephone Encounter (Signed)
Transition Care Management Follow-up Telephone Call  Date of discharge and from where: [facility] 07/02/2020   How have you been since you were released from the hospital? Patient has started having fevers again today. Max temp was 100 degrees has tried tylenol with no relief  Any questions or concerns? No  Items Reviewed:  Did the pt receive and understand the discharge instructions provided? Yes   Medications obtained and verified? Yes   Any new allergies since your discharge? No   Dietary orders reviewed? Yes  Do you have support at home? Yes daughter lives in home with patient   Functional Questionnaire: (I = Independent and D = Dependent) ADLs:   Bathing/Dressing- I  Meal Prep- D  Eating- I  Maintaining continence- I  Transferring/Ambulation- I with a device   Managing Meds- D  Follow up appointments reviewed:   PCP Hospital f/u appt confirmed? Yes  Scheduled to see Caitlin Peters  on 07/08/2020 @ 9:30 am.  [facility] f/u appt confirmed? Yes  Scheduled to see Caitlin Peters on 07/06/2020  @ 10:00 am .  Are transportation arrangements needed? No   If their condition worsens, is the pt aware to call PCP or go to the Emergency Dept.? Yes  Was the patient provided with contact information for the PCP's office or ED? Yes  Was to pt encouraged to call back with questions or concerns? Yes

## 2020-07-04 NOTE — Telephone Encounter (Signed)
-----   Message from Germaine Pomfret, [facility] sent at 07/04/2020 10:02 AM EDT ----- Regarding: Refill Request Wendie Simmer,   Can you please send in a refill of loperamide 2 mg capsules into [facility] if you are able? Thanks,   Doristine Section Clinical Pharmacist [facility] [place]

## 2020-07-04 NOTE — Telephone Encounter (Signed)
Patient and daughter made aware. Thanks

## 2020-07-04 NOTE — Telephone Encounter (Signed)
Rx done. 

## 2020-07-04 NOTE — Telephone Encounter (Signed)
If she is admitted she should tell the on-call physician to alert infectious disease physician on-call what ever hospital she is at

## 2020-07-04 NOTE — Telephone Encounter (Signed)
Received call from patient's home health nurse. Patient is refusing to see nurse because she is feeling bad. Home health nurse advise her to go to ED. LPN called   Patient  who states she is now feeling a little better her temp is down to 100.0 after taking Tylenol. Advised patient to please be evaluated at the nearest ED if needed. Patient agreed. Caitlin Peters

## 2020-07-04 NOTE — Telephone Encounter (Signed)
Patient calls stating she starting running a fever of 101.0 this morning. Says Dr. Tommy Medal told her to call if she did have any fevers. Patient states she also had and episode of vomiting/dirreha but she also has crohn's disease. Patient is scheduled with [facility] on 07/06/20. Routing to Doctor [employer] for advise.  [name]

## 2020-07-05 LAB — CULTURE, BLOOD (ROUTINE X 2)
Culture: NO GROWTH
Culture: NO GROWTH
Special Requests: ADEQUATE
Special Requests: ADEQUATE

## 2020-07-05 NOTE — Telephone Encounter (Signed)
Thank you :)

## 2020-07-06 ENCOUNTER — Encounter: Payer: Self-pay | Admitting: Infectious Disease

## 2020-07-06 ENCOUNTER — Ambulatory Visit (INDEPENDENT_AMBULATORY_CARE_PROVIDER_SITE_OTHER): Payer: Medicare Other | Admitting: Infectious Disease

## 2020-07-06 ENCOUNTER — Other Ambulatory Visit: Payer: Self-pay

## 2020-07-06 ENCOUNTER — Telehealth: Payer: Self-pay

## 2020-07-06 VITALS — BP 155/71 | HR 59 | Temp 98.5°F | Wt 144.0 lb

## 2020-07-06 DIAGNOSIS — K912 Postsurgical malabsorption, not elsewhere classified: Secondary | ICD-10-CM | POA: Diagnosis not present

## 2020-07-06 DIAGNOSIS — E1169 Type 2 diabetes mellitus with other specified complication: Secondary | ICD-10-CM

## 2020-07-06 DIAGNOSIS — K50819 Crohn's disease of both small and large intestine with unspecified complications: Secondary | ICD-10-CM

## 2020-07-06 DIAGNOSIS — E785 Hyperlipidemia, unspecified: Secondary | ICD-10-CM

## 2020-07-06 DIAGNOSIS — R509 Fever, unspecified: Secondary | ICD-10-CM | POA: Diagnosis not present

## 2020-07-06 DIAGNOSIS — T80219D Unspecified infection due to central venous catheter, subsequent encounter: Secondary | ICD-10-CM

## 2020-07-06 DIAGNOSIS — I10 Essential (primary) hypertension: Secondary | ICD-10-CM

## 2020-07-06 NOTE — Progress Notes (Signed)
Subjective:  Chief complaint: followup for another line infection   Patient ID: Caitlin Peters, female    DOB: 10-Sep-1961, 59 y.o.   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  She currently has a femoral IV access PICC line that was exchanged during her most recent hospitalization when we saw her in February.  She was admitted in February with Enterobacter cloacae bacteremia she was treated with Zosyn then changed to ertapenem for ease of administration at home.  Bacteremia cleared and radiology were able to exchange a new line over her old IV access site.  She then unfortunately developed a stenotrophomonas bacteremia associated with her line.  There went PICC line exchange on 14 April.    She is initially treated with Bactrim which was complicated by acute on chronic elevation in her serum creatinine.  We changed her over to IV levofloxacin that she completed.  I then admitted her for severe abdominal pain which was worked up at [facility].  Since I last saw her she was admitted to [facility] and seen by my partner Dr. Megan Salon on 4 August. Fortunately her blood cultures were negative. A clear-cut cause of her fever was not ascertained. She was treated With levofloxacin that she completed today.  She did have onset of a temperature to 101 and called our clinic a few days after being discharged in the hospital but the fever defervesced.  She is not having any other symptoms right now and cannot recall other symptoms besides the fever when she was hospitalized.  Her line is clean dry and intact Past Medical History:  Diagnosis Date  . Acute pancreatitis 04/13/2020  .  Anasarca 10/2019  . AVN (avascular necrosis of bone) ([place])   . Cataract   . Chronic pain syndrome   . CKD (chronic kidney disease), stage III   . Crohn disease ([place])   . Crohn disease ([place])   . Depression   . Diverticulosis   . GERD (gastroesophageal reflux disease)   . HTN (hypertension)   . IDA (iron deficiency anemia)   . Malnutrition ([place])   . Mass in chest   . Osteoporosis   . Pancreatitis   . Short gut syndrome   . Vitamin B12 deficiency     Past Surgical History:  Procedure Laterality Date  . ABDOMINAL HYSTERECTOMY    . ABDOMINAL SURGERY     colon resection with abdominal stoma, repair of intestinal leak, reversal of abdominal stoma  . BILIARY DILATION  11/26/2019   Procedure: BILIARY DILATION;  Surgeon: Jackquline Denmark, MD;  Location: [facility];  Service: Endoscopy;;  . BILIARY DILATION  03/08/2020   Procedure: BILIARY DILATION;  Surgeon: Irving Copas., MD;  Location: [place];  Service: Gastroenterology;;  . BIOPSY  03/08/2020   Procedure: BIOPSY;  Surgeon: Irving Copas., MD;  Location: [place];  Service: Gastroenterology;;  . CHEST WALL RESECTION     right thoracotomy,resection of chest mass with anterior rib and reconstruction using prosthetic mesh and video arthroscopy  . CHOLECYSTECTOMY    . COLONOSCOPY  2019  . ERCP N/A 11/26/2019   Procedure: ENDOSCOPIC RETROGRADE CHOLANGIOPANCREATOGRAPHY (ERCP);  Surgeon: Jackquline Denmark, MD;  Location:  [facility];  Service: Endoscopy;  Laterality: N/A;  . ERCP N/A 03/08/2020   Procedure: ENDOSCOPIC RETROGRADE CHOLANGIOPANCREATOGRAPHY (ERCP);  Surgeon: Irving Copas., MD;  Location: [place];  Service: Gastroenterology;  Laterality: N/A;  . ESOPHAGOGASTRODUODENOSCOPY N/A 03/08/2020   Procedure: ESOPHAGOGASTRODUODENOSCOPY (EGD);  Surgeon: Irving Copas., MD;  Location: [place];  Service: Gastroenterology;  Laterality: N/A;  . EUS N/A 03/08/2020   Procedure: UPPER ENDOSCOPIC  ULTRASOUND (EUS) LINEAR;  Surgeon: Irving Copas., MD;  Location: [place];  Service: Gastroenterology;  Laterality: N/A;  . IR FLUORO GUIDE CV LINE LEFT  01/07/2020  . IR FLUORO GUIDE CV LINE LEFT  03/09/2020  . IR FLUORO GUIDE CV LINE LEFT  05/09/2020  . IR PTA VENOUS EXCEPT DIALYSIS CIRCUIT  01/07/2020  . IR US GUIDE VASC ACCESS LEFT     x 2 06/17/19 and 09/14/2019  . KNEE SURGERY     right knee   . REMOVAL OF STONES  11/26/2019   Procedure: REMOVAL OF STONES;  Surgeon: Jackquline Denmark, MD;  Location: [facility];  Service: Endoscopy;;  . REMOVAL OF STONES  03/08/2020   Procedure: REMOVAL OF STONES;  Surgeon: Irving Copas., MD;  Location: [place];  Service: Gastroenterology;;  . SMALL INTESTINE SURGERY     x3  . SPHINCTEROTOMY  11/26/2019   Procedure: SPHINCTEROTOMY;  Surgeon: Jackquline Denmark, MD;  Location: [facility];  Service: Endoscopy;;  . UPPER GASTROINTESTINAL ENDOSCOPY      Family History  Problem Relation Age of Onset  . Breast cancer Sister   . Multiple sclerosis Sister   . Diabetes Sister   . Lupus Sister   . Colon cancer Other   . Crohn's disease Other   . Seizures Mother   . Glaucoma Mother   . CAD Father   . Heart disease Father   . Hypertension Father       Social History   Socioeconomic History  . Marital status: Single    Spouse name: Not on file  . Number of children: Not on file  . Years of education: Not on file  . Highest education level: Not on file  Occupational History  . Occupation: disabled  Tobacco Use  . Smoking status: Former Research scientist (life sciences)  . Smokeless tobacco: Never Used  Vaping Use  . Vaping Use: Never used  Substance and Sexual Activity  . Alcohol use: Not Currently  . Drug use: Never  . Sexual activity: Not on file  Other Topics Concern  . Not on file  Social History Narrative  . Not on file   Social Determinants of Health   Financial Resource Strain: Low Risk   . Difficulty of Paying Living Expenses: Not  hard at all  Food Insecurity:   . Worried About Charity fundraiser in the Last Year:   . Arboriculturist in the Last Year:   Transportation Needs: No Transportation Needs  . Lack of Transportation (Medical): No  . Lack of Transportation (Non-Medical): No  Physical Activity:   . Days of Exercise per Week:   . Minutes of Exercise per Session:   Stress:   . Feeling of Stress :   Social Connections:   . Frequency of Communication with Friends and Family:   . Frequency of Social Gatherings with Friends and Family:   . Attends Religious Services:   . Active Member of Clubs or Organizations:   . Attends Archivist Meetings:   [name] Kitchen Marital Status:  Allergies  Allergen Reactions  . Cefepime Other (See Comments)    Neurotoxicity occurring in setting of AKI. Ceftriaxone tolerated during same admit  . Gabapentin Other (See Comments)    unknown  . Hyoscyamine Hives and Swelling    Legs swelling   Disorientation  . Lyrica [Pregabalin] Other (See Comments)    unknown  . Meperidine Hives    Other reaction(s): GI Upset Due to Chrones   . Topamax [Topiramate] Other (See Comments)    unknown  . Fentanyl Rash    Pt is allergic to fentanyl patch related to the glue (gives her a rash) Pt states she is NOT allergic to fentanyl IV medicine  . Morphine And Related Rash     Current Outpatient Medications:  .  acetaminophen (TYLENOL) 325 MG tablet, Take 650 mg by mouth every 6 (six) hours as needed for mild pain. , Disp: , Rfl:  .  amLODipine (NORVASC) 10 MG tablet, Take 1 tablet (10 mg total) by mouth daily., Disp: 90 tablet, Rfl: 1 .  budesonide (ENTOCORT EC) 3 MG 24 hr capsule, TAKE 3 CAPSULES BY MOUTH ONCE DAILY (Patient taking differently: Take 3 mg by mouth daily. ), Disp: 90 capsule, Rfl: 0 .  buPROPion (WELLBUTRIN SR) 100 MG 12 hr tablet, Take 2 tablets by mouth once daily, Disp: 60 tablet, Rfl: 5 .  calcium carbonate (TUMS - DOSED IN MG ELEMENTAL CALCIUM) 500 MG chewable  tablet, Chew 1 tablet by mouth 3 (three) times daily as needed for indigestion or heartburn., Disp: , Rfl:  .  cholecalciferol (VITAMIN D3) 25 MCG (1000 UT) tablet, Take 1,000 Units by mouth daily. , Disp: , Rfl:  .  cycloSPORINE (RESTASIS) 0.05 % ophthalmic emulsion, Place 1 drop into both eyes 2 (two) times daily., Disp: , Rfl:  .  dexlansoprazole (DEXILANT) 60 MG capsule, Take 1 capsule (60 mg total) by mouth daily., Disp: 90 capsule, Rfl: 1 .  Dextran 70-Hypromellose 0.1-0.3 % SOLN, Place 1 drop into both eyes 4 (four) times daily., Disp: , Rfl:  .  dicyclomine (BENTYL) 20 MG tablet, Take 20 mg by mouth 3 (three) times daily., Disp: , Rfl:  .  diphenoxylate-atropine (LOMOTIL) 2.5-0.025 MG tablet, Take 1 tablet by mouth 4 (four) times daily as needed for diarrhea or loose stools., Disp: 30 tablet, Rfl: 0 .  DULoxetine (CYMBALTA) 30 MG capsule, Take 3 capsules (90 mg total) by mouth daily., Disp: 90 capsule, Rfl: 1 .  estradiol (ESTRACE) 2 MG tablet, Take 1 tablet (2 mg total) by mouth daily., Disp: 90 tablet, Rfl: 1 .  famotidine (PEPCID) 20 MG tablet, Take 20 mg by mouth at bedtime., Disp: , Rfl:  .  hydrALAZINE (APRESOLINE) 50 MG tablet, Take 1 tablet (50 mg total) by mouth 3 (three) times daily., Disp: 90 tablet, Rfl: 2 .  HYDROmorphone (DILAUDID) 4 MG tablet, Take 1 tablet (4 mg total) by mouth every 6 (six) hours as needed for severe pain., Disp: 30 tablet, Rfl: 0 .  lipase/protease/amylase (CREON) 36000 UNITS CPEP capsule, Take 1 capsule (36,000 Units total) by mouth 3 (three) times daily as needed (with meals for digestion)., Disp: 180 capsule, Rfl: 0 .  loperamide (IMODIUM) 2 MG capsule, TAKE 1 CAPSULE BY MOUTH AS NEEDED FOR DIARRHEA OR  LOOSE  STOOLS, Disp: 90 capsule, Rfl: 0 .  methadone (DOLOPHINE) 5 MG tablet, Take 1 tablet (5 mg total) by mouth 5 (five) times daily., Disp: 30 tablet, Rfl: 0 .  metoprolol tartrate (LOPRESSOR) 100 MG  tablet, Take 1 tablet (100 mg total) by mouth 2 (two)  times daily., Disp: 90 tablet, Rfl: 1 .  Multiple Vitamins-Minerals (MULTIVITAMIN ADULT PO), Take 1 tablet by mouth daily., Disp: , Rfl:  .  Omega-3 1000 MG CAPS, Take 1,000 mg by mouth daily. , Disp: , Rfl:  .  potassium chloride SA (KLOR-CON) 20 MEQ tablet, Take 2 tablets (40 mEq total) by mouth daily., Disp: 90 tablet, Rfl: 0 .  PRESCRIPTION MEDICATION, Inject 1 each into the vein daily. Home TPN . [facility] . 1 bag for 16 hours. [phone], Disp: , Rfl:  .  Probiotic Product (PROBIOTIC-10 PO), Take 1 capsule by mouth daily. , Disp: , Rfl:  .  promethazine (PHENERGAN) 25 MG tablet, Take 1 tablet (25 mg total) by mouth every 6 (six) hours as needed for nausea or vomiting. TAKE 1 TABLET BY MOUTH EVERY 6 HOURS AS NEEDED FOR NAUSEA AND VOMITING, Disp: 20 tablet, Rfl: 0 .  sucralfate (CARAFATE) 1 GM/10ML suspension, Take 10 mLs (1 g total) by mouth 4 (four) times daily -  with meals and at bedtime., Disp: 420 mL, Rfl: 2 .  traZODone (DESYREL) 100 MG tablet, Take 2 tablets (200 mg total) by mouth at bedtime as needed for sleep., Disp: 60 tablet, Rfl: 0 .  vitamin B-12 (CYANOCOBALAMIN) 100 MCG tablet, Take 1,000 mcg by mouth daily. , Disp: , Rfl:  .  levofloxacin (LEVAQUIN) 750 MG tablet, Take 1 tablet (750 mg total) by mouth every other day for 4 days. (Patient not taking: Reported on 07/06/2020), Disp: 2 tablet, Rfl: 0   Review of Systems  Constitutional: Positive for fever. Negative for activity change, appetite change, chills, diaphoresis, fatigue and unexpected weight change.  HENT: Negative for congestion, rhinorrhea, sinus pressure, sneezing, sore throat and trouble swallowing.   Eyes: Negative for photophobia and visual disturbance.  Respiratory: Negative for cough, chest tightness, shortness of breath, wheezing and stridor.   Cardiovascular: Negative for chest pain, palpitations and leg swelling.  Gastrointestinal: Negative for blood in stool, constipation,  diarrhea, nausea and vomiting.  Genitourinary: Negative for difficulty urinating, dysuria, flank pain and hematuria.  Musculoskeletal: Negative for arthralgias, back pain, gait problem, joint swelling and myalgias.  Skin: Negative for color change, pallor, rash and wound.  Neurological: Negative for dizziness, tremors, weakness and light-headedness.  Hematological: Negative for adenopathy. Does not bruise/bleed easily.  Psychiatric/Behavioral: Negative for agitation, behavioral problems, confusion, decreased concentration, dysphoric mood and sleep disturbance.       Objective:   Physical Exam Constitutional:      General: She is not in acute distress.    Appearance: She is well-developed. She is not diaphoretic.  HENT:     Head: Normocephalic and atraumatic.     Mouth/Throat:     Pharynx: No oropharyngeal exudate.  Eyes:     General: No scleral icterus.    Conjunctiva/sclera: Conjunctivae normal.  Cardiovascular:     Rate and Rhythm: Normal rate and regular rhythm.  Pulmonary:     Effort: Pulmonary effort is normal. No respiratory distress.     Breath sounds: No wheezing.  Abdominal:     General: There is no distension.     Tenderness: There is no abdominal tenderness.  Musculoskeletal:        General: No tenderness.     Cervical back: Normal range of motion and neck supple.  Skin:    General: Skin is warm and dry.     Coloration: Skin  is not pale.     Findings: No erythema or rash.  Neurological:     Mental Status: She is alert and oriented to person, place, and time.     Motor: No abnormal muscle tone.     Coordination: Coordination normal.  Psychiatric:        Attention and Perception: Attention and perception normal.        Mood and Affect: Mood and affect normal. Mood is not anxious.        Speech: Speech normal.        Behavior: Behavior normal.        Thought Content: Thought content normal.        Cognition and Memory: Cognition and memory normal.         Judgment: Judgment normal.    PICC line entry site is clean dry and intact Apr 13, 2020     May 16, 2020:     Line is clean today     Assessment & Plan:  FUO: Not clear what caused this that seem to get better on Levaquin although she did have an isolated temperature after discharge. Hopefully she does not have another bloodstream infection she certainly did not grow bacteria in the blood cultures on admission this time.  History of recurrent PICC line infections: Her current PICC line is clean dry and intact and is not overtly infected    Gut syndrome continue on TPN with limited IV access options

## 2020-07-06 NOTE — Progress Notes (Signed)
Chronic Care Management Pharmacy Assistant   Name: Caitlin Peters  MRN: [id number] DOB: [birth year]  Reason for Encounter: Medication Review/General Adherence Call.   PCP : [name], [name], MD  Allergies:   Allergies  Allergen Reactions  . Cefepime Other (See Comments)    Neurotoxicity occurring in setting of AKI. Ceftriaxone tolerated during same admit  . Gabapentin Other (See Comments)    unknown  . Hyoscyamine Hives and Swelling    Legs swelling   Disorientation  . Lyrica [Pregabalin] Other (See Comments)    unknown  . Meperidine Hives    Other reaction(s): GI Upset Due to Chrones   . Topamax [Topiramate] Other (See Comments)    unknown  . Fentanyl Rash    Pt is allergic to fentanyl patch related to the glue (gives her a rash) Pt states she is NOT allergic to fentanyl IV medicine  . Morphine And Related Rash    Medications: Outpatient Encounter Medications as of 07/06/2020  Medication Sig  . acetaminophen (TYLENOL) 325 MG tablet Take 650 mg by mouth every 6 (six) hours as needed for mild pain.   [name] Kitchen amLODipine (NORVASC) 10 MG tablet Take 1 tablet (10 mg total) by mouth daily.  . budesonide (ENTOCORT EC) 3 MG 24 hr capsule TAKE 3 CAPSULES BY MOUTH ONCE DAILY (Patient taking differently: Take 3 mg by mouth daily. )  . buPROPion (WELLBUTRIN SR) 100 MG 12 hr tablet Take 2 tablets by mouth once daily  . calcium carbonate (TUMS - DOSED IN MG ELEMENTAL CALCIUM) 500 MG chewable tablet Chew 1 tablet by mouth 3 (three) times daily as needed for indigestion or heartburn.  . cholecalciferol (VITAMIN D3) 25 MCG (1000 UT) tablet Take 1,000 Units by mouth daily.   . cycloSPORINE (RESTASIS) 0.05 % ophthalmic emulsion Place 1 drop into both eyes 2 (two) times daily.  [name] Kitchen dexlansoprazole (DEXILANT) 60 MG capsule Take 1 capsule (60 mg total) by mouth daily.  [name] Kitchen Dextran 70-Hypromellose 0.1-0.3 % SOLN Place 1 drop into both eyes 4 (four) times daily.  [name] Kitchen dicyclomine (BENTYL) 20  MG tablet Take 20 mg by mouth 3 (three) times daily.  . diphenoxylate-atropine (LOMOTIL) 2.5-0.025 MG tablet Take 1 tablet by mouth 4 (four) times daily as needed for diarrhea or loose stools.  . DULoxetine (CYMBALTA) 30 MG capsule Take 3 capsules (90 mg total) by mouth daily.  [name] Kitchen estradiol (ESTRACE) 2 MG tablet Take 1 tablet (2 mg total) by mouth daily.  . famotidine (PEPCID) 20 MG tablet Take 20 mg by mouth at bedtime.  . hydrALAZINE (APRESOLINE) 50 MG tablet Take 1 tablet (50 mg total) by mouth 3 (three) times daily.  [name] Kitchen HYDROmorphone (DILAUDID) 4 MG tablet Take 1 tablet (4 mg total) by mouth every 6 (six) hours as needed for severe pain.  [name] Kitchen levofloxacin (LEVAQUIN) 750 MG tablet Take 1 tablet (750 mg total) by mouth every other day for 4 days. (Patient not taking: Reported on 07/06/2020)  . lipase/protease/amylase (CREON) 36000 UNITS CPEP capsule Take 1 capsule (36,000 Units total) by mouth 3 (three) times daily as needed (with meals for digestion).  [name] Kitchen loperamide (IMODIUM) 2 MG capsule TAKE 1 CAPSULE BY MOUTH AS NEEDED FOR DIARRHEA OR&nbsp;&nbsp;LOOSE&nbsp;&nbsp;STOOLS  . methadone (DOLOPHINE) 5 MG tablet Take 1 tablet (5 mg total) by mouth 5 (five) times daily.  . metoprolol tartrate (LOPRESSOR) 100 MG tablet Take 1 tablet (100 mg total) by mouth 2 (two) times daily.  . Multiple Vitamins-Minerals (MULTIVITAMIN ADULT PO) Take  1 tablet by mouth daily.  . Omega-3 1000 MG CAPS Take 1,000 mg by mouth daily.   . potassium chloride SA (KLOR-CON) 20 MEQ tablet Take 2 tablets (40 mEq total) by mouth daily.  [name] Kitchen PRESCRIPTION MEDICATION Inject 1 each into the vein daily. Home TPN . [facility] [place] . 1 bag for 16 hours. [phone]  . Probiotic Product (PROBIOTIC-10 PO) Take 1 capsule by mouth daily.   . promethazine (PHENERGAN) 25 MG tablet Take 1 tablet (25 mg total) by mouth every 6 (six) hours as needed for nausea or vomiting. TAKE 1 TABLET BY MOUTH EVERY 6 HOURS AS NEEDED FOR  NAUSEA AND VOMITING  . sucralfate (CARAFATE) 1 GM/10ML suspension Take 10 mLs (1 g total) by mouth 4 (four) times daily -  with meals and at bedtime.  . traZODone (DESYREL) 100 MG tablet Take 2 tablets (200 mg total) by mouth at bedtime as needed for sleep.  . vitamin B-12 (CYANOCOBALAMIN) 100 MCG tablet Take 1,000 mcg by mouth daily.    No facility-administered encounter medications on file as of 07/06/2020.    Current Diagnosis: Patient Active Problem List   Diagnosis Date Noted  . Anxiety 06/03/2020  . Acute pancreatitis 04/13/2020  . Infection due to Stenotrophomonas maltophilia 03/10/2020  . Abnormal LFTs 03/05/2020  . Pancytopenia ([place]) 03/05/2020  . Stage 3b chronic kidney disease 03/05/2020  . Infection of peripherally inserted central venous catheter (PICC)   . On total parenteral nutrition (TPN) 01/02/2020  . Elevated liver enzymes 01/02/2020  . Cholangitis   . Anasarca 10/28/2019  . Acute kidney injury superimposed on chronic kidney disease ([place]) 10/28/2019  . Falls 10/28/2019  . Crohn disease ([place])   . Malnutrition ([place])   . Short gut syndrome   . HTN (hypertension)   . Vitamin B12 deficiency   . Osteoporosis   . Depression   . GERD (gastroesophageal reflux disease)   . Chronic pain syndrome   . Hypokalemia due to excessive gastrointestinal loss of potassium 10/13/2019  . Diarrhea   . Fever   . Hypokalemia 10/12/2019  . Chronic diarrhea 10/12/2019  . Dysuria 10/12/2019  . Bilateral lower extremity edema 10/12/2019  . Crohn's colitis ([place]) 10/12/2019  . Anemia 10/12/2019  . Hypertension 10/12/2019  . AKI (acute kidney injury) ([place]) 10/12/2019    Goals Addressed   None     Follow-Up:  Pharmacist Review   Spoke with patient daughter , Patient daughter stated Patient has started having fevers again on 07/04/2020. Max temp was 100 degrees has tried tylenol extra strength with relief. Patient stated on Tuesday 07/05/2020 the highest her temp was 99.0 and has  been normal since. Patient daughter stated her mother has little energy and fatigue.  Patient has appointment on 07/08/2020 with her PCP [name]  at 9:30 A.M  Patient daughter has no concerns or question for the clinical pharmaisct Junius Argyle.  Patient daughter stated no refills of her medications are needed at his time   Patient daughter reported ,  Patient was seen today 07/06/2020 and the provider Lucianne Lei Dam,Cornelius sent in a two week supply of Hydromorphone 72m , patient pick it up at a local pharmacy.  BEsterbrookPharmacist Assistant [phone]

## 2020-07-07 ENCOUNTER — Encounter: Payer: Self-pay | Admitting: Family Medicine

## 2020-07-07 ENCOUNTER — Ambulatory Visit (INDEPENDENT_AMBULATORY_CARE_PROVIDER_SITE_OTHER): Payer: Medicare Other | Admitting: Family Medicine

## 2020-07-07 ENCOUNTER — Ambulatory Visit (INDEPENDENT_AMBULATORY_CARE_PROVIDER_SITE_OTHER): Payer: Medicare Other

## 2020-07-07 VITALS — BP 130/70 | HR 43 | Ht 68.0 in | Wt 146.6 lb

## 2020-07-07 DIAGNOSIS — R202 Paresthesia of skin: Secondary | ICD-10-CM | POA: Diagnosis not present

## 2020-07-07 NOTE — Patient Instructions (Addendum)
Thank you for coming in today.  Plan for nerve study.  You should hear from Neurology soon about this.   Get xray today.   Return about 1-2 weeks after nerve study is done.   Let me know if you have a problem getting the nerve study.

## 2020-07-07 NOTE — Progress Notes (Signed)
Subjective:    CC: B hand pain and numbness/tingling, R>L  I, Molly Weber, LAT, ATC, am serving as scribe for Dr. [name].  HPI: Pt is a 59 y/o female presenting w/ c/o chronic B hand numbness/tingling, R>L.  She notes that it occurs in both upper extremities from her thumb to her fifth digit.  This occurs during activity and at rest.  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  This is been ongoing for quite a while.  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   Objective:    Vitals:   07/07/20 1233  BP: 130/70  Pulse: (!) 43  SpO2: 99%   General: Well Developed, well nourished, and in no acute distress.   MSK: C-spine normal-appearing nontender normal motion.  Negative Spurling's test. Extremity strength is intact. Hands and wrist normal-appearing bilaterally.  Negative Tinel's mildly positive Phalen's test.  Also his cap refill and sensation are intact distally.  Lab and Radiology Results X-ray images C-spine obtained today personally and independently reviewed Mild DDD and neuroforaminal stenosis lower cervical spine.  No acute fractures or severe malalignment. Await for radiology review  Component     Latest Ref Rng & Units 05/24/2020 06/27/2020  WBC     4.0 - 10.5 K/uL  5.7  RBC     3.87 - 5.11 MIL/uL  2.98 (L)  Hemoglobin     12.0 - 15.0 g/dL  9.3 (L)  HCT     36 - 46 %  28.9 (L)  MCV     80.0 - 100.0 fL  97.0  MCH     26.0 - 34.0 pg  31.2  MCHC     30.0 - 36.0 g/dL  32.2  RDW     11.5 - 15.5 %  13.3  Platelets     150 - 400 K/uL  143 (L)  nRBC     0.0 - 0.2 %  0.0  Neutrophils     %  87  NEUT#     1.7 - 7.7  K/uL  5.0  Lymphocytes     %  9  Lymphocyte #     0.7 - 4.0 K/uL  0.5 (L)  Monocytes Relative     %  2  Monocyte #     0 - 1 K/uL  0.1  Eosinophil     %  1  Eosinophils Absolute     0 - 0 K/uL  0.1  Basophil     %  0  Basophils Absolute     0 - 0 K/uL  0.0  Immature Granulocytes     %  1  Abs Immature Granulocytes     0.00 - 0.07 K/uL  0.03  Sodium     135 - 145 mmol/L  139  Potassium     3.5 - 5.1 mmol/L  3.8  Chloride     98 - 111 mmol/L  99  CO2     22 - 32 mmol/L  28  Glucose     70 - 99 mg/dL  76  BUN     6 - 20 mg/dL  52 (H)  Creatinine     0.44 - 1.00  mg/dL  1.96 (H)  Calcium     8.9 - 10.3 mg/dL  9.1  Total Protein     6.5 - 8.1 g/dL  7.3  Albumin     3.5 - 5.0 g/dL  3.6  AST     15 - 41 U/L  216 (H)  ALT     0 - 44 U/L  164 (H)  Alkaline Phosphatase     38 - 126 U/L  254 (H)  Total Bilirubin     0.3 - 1.2 mg/dL  1.2  GFR, Est Non African American     >60 mL/min  27 (L)  GFR, Est African American     >60 mL/min  32 (L)  Anion gap     5 - 15  12  TSH     0.35 - 4.50 uIU/mL 0.54   VITD     30.00 - 100.00 ng/mL 44.31   Vitamin B12     211 - 911 pg/mL 710     Impression and Recommendations:    Assessment and Plan: 59 y.o. female with paresthesias.  Patient certainly has lots of medical conditions that could predispose her to neuropathy or other causes of paresthesia.  She had some labs obtained relatively recently that showed normal B12 vitamin D and TSH.  Electrolytes are abnormal but not drastically so.  Plan for further work-up including nerve conduction study and x-ray C-spine.  Nerve conduction study nondiagnostic would likely proceed with MRI C-spine.  Recheck after nerve conduction study..   Orders Placed This Encounter  Procedures  . DG Cervical Spine Complete    Standing Status:   Future    Number of Occurrences:   1    Standing Expiration Date:   07/07/2021    Order Specific Question:   Reason for Exam (SYMPTOM  OR DIAGNOSIS  REQUIRED)    Answer:   eval possible cervical radiculopathy C6-7 - 8    Order Specific Question:   Is patient pregnant?    Answer:   No    Order Specific Question:   Preferred imaging location?    Answer:   [facility]    Order Specific Question:   Radiology Contrast Protocol - do NOT remove file path    Answer:   [email or web address]  . Ambulatory referral to Neurology    Referral Priority:   Routine    Referral Type:   Consultation    Referral Reason:   Specialty Services Required    Requested Specialty:   Neurology    Number of Visits Requested:   1  . NCV with EMG(electromyography)    Standing Status:   Future    Standing Expiration Date:   07/07/2021    Order Specific Question:   Where should this test be performed?    Answer:   [facility]   No orders of the defined types were placed in this encounter.   Discussed warning signs or symptoms. Please see discharge instructions. Patient expresses understanding.   The above documentation has been reviewed and is accurate and complete [name], M.D.

## 2020-07-08 ENCOUNTER — Encounter: Payer: Self-pay | Admitting: Internal Medicine

## 2020-07-08 ENCOUNTER — Ambulatory Visit (INDEPENDENT_AMBULATORY_CARE_PROVIDER_SITE_OTHER): Payer: Medicare Other | Admitting: Internal Medicine

## 2020-07-08 ENCOUNTER — Other Ambulatory Visit: Payer: Self-pay

## 2020-07-08 VITALS — BP 130/80 | HR 70 | Temp 98.3°F | Wt 146.3 lb

## 2020-07-08 DIAGNOSIS — Z09 Encounter for follow-up examination after completed treatment for conditions other than malignant neoplasm: Secondary | ICD-10-CM

## 2020-07-08 DIAGNOSIS — K5090 Crohn's disease, unspecified, without complications: Secondary | ICD-10-CM

## 2020-07-08 DIAGNOSIS — R509 Fever, unspecified: Secondary | ICD-10-CM

## 2020-07-08 NOTE — Patient Instructions (Signed)
-  Nice seeing you today!!  -Schedule your follow up in 6 months.

## 2020-07-08 NOTE — Progress Notes (Signed)
Hospital follow-up visit     CC/Reason for Visit: Hospitalization follow-up  HPI: Caitlin Peters is a 59 y.o. female who is coming in today for the above mentioned reasons, specifically transitional care services face-to-face visit.    Dates hospitalized: 06/27/20-07/02/20 Days since discharge from hospital: 6 days Patient was discharged from the hospital to: Home Reason for admission to hospital: Fever Date of interactive phone contact with patient and/or caregiver: 07/04/2020  I have reviewed in detail patient's discharge summary plus pertinent specific notes, labs, and images from the hospitalization.  Yes She had a temperature of 102 at home.  She has a history of Crohn's disease with short gut syndrome on TPN as well as multiple line infections.  Thankfully this time her blood cultures were negative.  She was seen by ID and was given a course of levofloxacin which she has completed.  She has been doing well since her discharge.  She is up 1 pound since her last visit.  She has followed up with ID since her discharge.  Medication reconciliation was done today and patient is taking meds as recommended by discharging hospitalist/specialist.  Yes   Past Medical/Surgical History: Past Medical History:  Diagnosis Date  . Acute pancreatitis 04/13/2020  . Anasarca 10/2019  . AVN (avascular necrosis of bone) ([place])   . Cataract   . Chronic pain syndrome   . CKD (chronic kidney disease), stage III   . Crohn disease ([place])   . Crohn disease ([place])   . Depression   . Diverticulosis   . GERD (gastroesophageal reflux disease)   . HTN (hypertension)   . IDA (iron deficiency anemia)   . Malnutrition ([place])   . Mass in chest   . Osteoporosis   . Pancreatitis   . Short gut syndrome   . Vitamin B12 deficiency     Past Surgical History:  Procedure Laterality Date  . ABDOMINAL HYSTERECTOMY    . ABDOMINAL SURGERY     colon resection with abdominal stoma, repair of intestinal leak,  reversal of abdominal stoma  . BILIARY DILATION  11/26/2019   Procedure: BILIARY DILATION;  Surgeon: Jackquline Denmark, MD;  Location: [facility];  Service: Endoscopy;;  . BILIARY DILATION  03/08/2020   Procedure: BILIARY DILATION;  Surgeon: Irving Copas., MD;  Location: [place];  Service: Gastroenterology;;  . BIOPSY  03/08/2020   Procedure: BIOPSY;  Surgeon: Irving Copas., MD;  Location: [place];  Service: Gastroenterology;;  . CHEST WALL RESECTION     right thoracotomy,resection of chest mass with anterior rib and reconstruction using prosthetic mesh and video arthroscopy  . CHOLECYSTECTOMY    . COLONOSCOPY  2019  . ERCP N/A 11/26/2019   Procedure: ENDOSCOPIC RETROGRADE CHOLANGIOPANCREATOGRAPHY (ERCP);  Surgeon: Jackquline Denmark, MD;  Location: [facility];  Service: Endoscopy;  Laterality: N/A;  . ERCP N/A 03/08/2020   Procedure: ENDOSCOPIC RETROGRADE CHOLANGIOPANCREATOGRAPHY (ERCP);  Surgeon: Irving Copas., MD;  Location: [place];  Service: Gastroenterology;  Laterality: N/A;  . ESOPHAGOGASTRODUODENOSCOPY N/A 03/08/2020   Procedure: ESOPHAGOGASTRODUODENOSCOPY (EGD);  Surgeon: Irving Copas., MD;  Location: [place];  Service: Gastroenterology;  Laterality: N/A;  . EUS N/A 03/08/2020   Procedure: UPPER ENDOSCOPIC ULTRASOUND (EUS) LINEAR;  Surgeon: Irving Copas., MD;  Location: [place];  Service: Gastroenterology;  Laterality: N/A;  . IR FLUORO GUIDE CV LINE LEFT  01/07/2020  . IR FLUORO GUIDE CV LINE LEFT  03/09/2020  . IR FLUORO GUIDE CV LINE LEFT  05/09/2020  .  IR PTA VENOUS EXCEPT DIALYSIS CIRCUIT  01/07/2020  . IR US GUIDE VASC ACCESS LEFT     x 2 06/17/19 and 09/14/2019  . KNEE SURGERY     right knee   . REMOVAL OF STONES  11/26/2019   Procedure: REMOVAL OF STONES;  Surgeon: Jackquline Denmark, MD;  Location: [facility];  Service: Endoscopy;;  . REMOVAL OF STONES  03/08/2020   Procedure: REMOVAL OF STONES;  Surgeon:  Irving Copas., MD;  Location: [place];  Service: Gastroenterology;;  . SMALL INTESTINE SURGERY     x3  . SPHINCTEROTOMY  11/26/2019   Procedure: SPHINCTEROTOMY;  Surgeon: Jackquline Denmark, MD;  Location: [facility];  Service: Endoscopy;;  . UPPER GASTROINTESTINAL ENDOSCOPY      Social History:  reports that she has quit smoking. She has never used smokeless tobacco. She reports previous alcohol use. She reports that she does not use drugs.  Allergies: Allergies  Allergen Reactions  . Cefepime Other (See Comments)    Neurotoxicity occurring in setting of AKI. Ceftriaxone tolerated during same admit  . Gabapentin Other (See Comments)    unknown  . Hyoscyamine Hives and Swelling    Legs swelling   Disorientation  . Lyrica [Pregabalin] Other (See Comments)    unknown  . Meperidine Hives    Other reaction(s): GI Upset Due to [place]   . Topamax [Topiramate] Other (See Comments)    unknown  . Fentanyl Rash    Pt is allergic to fentanyl patch related to the glue (gives her a rash) Pt states she is NOT allergic to fentanyl IV medicine  . Morphine And Related Rash    Family History:  Family History  Problem Relation Age of Onset  . Breast cancer Sister   . Multiple sclerosis Sister   . Diabetes Sister   . Lupus Sister   . Colon cancer Other   . Crohn's disease Other   . Seizures Mother   . Glaucoma Mother   . CAD Father   . Heart disease Father   . Hypertension Father      Current Outpatient Medications:  .  acetaminophen (TYLENOL) 325 MG tablet, Take 650 mg by mouth every 6 (six) hours as needed for mild pain. , Disp: , Rfl:  .  amLODipine (NORVASC) 10 MG tablet, Take 1 tablet (10 mg total) by mouth daily., Disp: 90 tablet, Rfl: 1 .  budesonide (ENTOCORT EC) 3 MG 24 hr capsule, TAKE 3 CAPSULES BY MOUTH ONCE DAILY (Patient taking differently: Take 3 mg by mouth daily. ), Disp: 90 capsule, Rfl: 0 .  buPROPion (WELLBUTRIN SR) 100 MG 12 hr tablet, Take 2  tablets by mouth once daily, Disp: 60 tablet, Rfl: 5 .  calcium carbonate (TUMS - DOSED IN MG ELEMENTAL CALCIUM) 500 MG chewable tablet, Chew 1 tablet by mouth 3 (three) times daily as needed for indigestion or heartburn., Disp: , Rfl:  .  cholecalciferol (VITAMIN D3) 25 MCG (1000 UT) tablet, Take 1,000 Units by mouth daily. , Disp: , Rfl:  .  cycloSPORINE (RESTASIS) 0.05 % ophthalmic emulsion, Place 1 drop into both eyes 2 (two) times daily., Disp: , Rfl:  .  dexlansoprazole (DEXILANT) 60 MG capsule, Take 1 capsule (60 mg total) by mouth daily., Disp: 90 capsule, Rfl: 1 .  Dextran 70-Hypromellose 0.1-0.3 % SOLN, Place 1 drop into both eyes 4 (four) times daily., Disp: , Rfl:  .  dicyclomine (BENTYL) 20 MG tablet, Take 20 mg by mouth 3 (three) times daily.,  Disp: , Rfl:  .  diphenoxylate-atropine (LOMOTIL) 2.5-0.025 MG tablet, Take 1 tablet by mouth 4 (four) times daily as needed for diarrhea or loose stools., Disp: 30 tablet, Rfl: 0 .  DULoxetine (CYMBALTA) 30 MG capsule, Take 3 capsules (90 mg total) by mouth daily., Disp: 90 capsule, Rfl: 1 .  estradiol (ESTRACE) 2 MG tablet, Take 1 tablet (2 mg total) by mouth daily., Disp: 90 tablet, Rfl: 1 .  famotidine (PEPCID) 20 MG tablet, Take 20 mg by mouth at bedtime., Disp: , Rfl:  .  hydrALAZINE (APRESOLINE) 50 MG tablet, Take 1 tablet (50 mg total) by mouth 3 (three) times daily., Disp: 90 tablet, Rfl: 2 .  HYDROmorphone (DILAUDID) 4 MG tablet, Take 1 tablet (4 mg total) by mouth every 6 (six) hours as needed for severe pain., Disp: 30 tablet, Rfl: 0 .  lipase/protease/amylase (CREON) 36000 UNITS CPEP capsule, Take 1 capsule (36,000 Units total) by mouth 3 (three) times daily as needed (with meals for digestion)., Disp: 180 capsule, Rfl: 0 .  loperamide (IMODIUM) 2 MG capsule, TAKE 1 CAPSULE BY MOUTH AS NEEDED FOR DIARRHEA OR&nbsp;&nbsp;LOOSE&nbsp;&nbsp;STOOLS, Disp: 90 capsule, Rfl: 0 .  methadone (DOLOPHINE) 5 MG tablet, Take 1 tablet (5 mg total) by  mouth 5 (five) times daily., Disp: 30 tablet, Rfl: 0 .  metoprolol tartrate (LOPRESSOR) 100 MG tablet, Take 1 tablet (100 mg total) by mouth 2 (two) times daily., Disp: 90 tablet, Rfl: 1 .  Multiple Vitamins-Minerals (MULTIVITAMIN ADULT PO), Take 1 tablet by mouth daily., Disp: , Rfl:  .  Omega-3 1000 MG CAPS, Take 1,000 mg by mouth daily. , Disp: , Rfl:  .  potassium chloride SA (KLOR-CON) 20 MEQ tablet, Take 2 tablets (40 mEq total) by mouth daily., Disp: 90 tablet, Rfl: 0 .  PRESCRIPTION MEDICATION, Inject 1 each into the vein daily. Home TPN . [facility]  . 1 bag for 16 hours. [phone], Disp: , Rfl:  .  Probiotic Product (PROBIOTIC-10 PO), Take 1 capsule by mouth daily. , Disp: , Rfl:  .  promethazine (PHENERGAN) 25 MG tablet, Take 1 tablet (25 mg total) by mouth every 6 (six) hours as needed for nausea or vomiting. TAKE 1 TABLET BY MOUTH EVERY 6 HOURS AS NEEDED FOR NAUSEA AND VOMITING, Disp: 20 tablet, Rfl: 0 .  sucralfate (CARAFATE) 1 GM/10ML suspension, Take 10 mLs (1 g total) by mouth 4 (four) times daily -  with meals and at bedtime., Disp: 420 mL, Rfl: 2 .  traZODone (DESYREL) 100 MG tablet, Take 2 tablets (200 mg total) by mouth at bedtime as needed for sleep., Disp: 60 tablet, Rfl: 0 .  vitamin B-12 (CYANOCOBALAMIN) 100 MCG tablet, Take 1,000 mcg by mouth daily. , Disp: , Rfl:   Review of Systems:  Constitutional: Denies fever, chills, diaphoresis, appetite change and fatigue.  HEENT: Denies photophobia, eye pain, redness, hearing loss, ear pain, congestion, sore throat, rhinorrhea, sneezing, mouth sores, trouble swallowing, neck pain, neck stiffness and tinnitus.   Respiratory: Denies SOB, DOE, cough, chest tightness,  and wheezing.   Cardiovascular: Denies chest pain, palpitations and leg swelling.  Gastrointestinal: Denies nausea, vomiting, abdominal pain, diarrhea, constipation, blood in stool and abdominal distention.  Genitourinary: Denies dysuria,  urgency, frequency, hematuria, flank pain and difficulty urinating.  Endocrine: Denies: hot or cold intolerance, sweats, changes in hair or nails, polyuria, polydipsia. Musculoskeletal: Denies myalgias, back pain, joint swelling, arthralgias and gait problem.  Skin: Denies pallor, rash and wound.  Neurological: Denies  dizziness, seizures, syncope, weakness, light-headedness, numbness and headaches.  Hematological: Denies adenopathy. Easy bruising, personal or family bleeding history  Psychiatric/Behavioral: Denies suicidal ideation, mood changes, confusion, nervousness, sleep disturbance and agitation    Physical Exam: Vitals:   07/08/20 0928  BP: 130/80  Pulse: 70  Temp: 98.3 F (36.8 C)  TempSrc: Oral  SpO2: 94%  Weight: 146 lb 4.8 oz (66.4 kg)    Body mass index is 22.24 kg/m.   Constitutional: NAD, calm, comfortable Eyes: PERRL, lids and conjunctivae normal ENMT: Mucous membranes are moist.  Respiratory: clear to auscultation bilaterally, no wheezing, no crackles. Normal respiratory effort. No accessory muscle use.  Cardiovascular: Regular rate and rhythm, no murmurs / rubs / gallops. No extremity edema.  Abdomen: no tenderness, no masses palpated. No hepatosplenomegaly. Bowel sounds positive.  Left femoral PICC line in place, no signs of superficial, surrounding infection. Neurologic: Grossly intact and nonfocal. Psychiatric: Normal judgment and insight. Alert and oriented x 3. Normal mood.    Impression and Plan:  Hospital discharge follow-up Fever, unspecified fever cause  -She has been doing well since her discharge, has completed her antibiotic course, has already followed up with ID.  Is administering her TPN every evening.  She has no abdominal pain above her usual.  Medical decision making of moderate complexity was utilized today.  Patient Instructions  -Nice seeing you today!!  -Schedule your follow up in 6 months.     [name],  MD [employer] [facility]

## 2020-07-08 NOTE — Progress Notes (Signed)
Cervical spine x-ray does show some arthritis and narrowing of the space where the nerve has to come out.  This could potentially cause some pinched nerves.  If nerve conduction study does not help [place] proceed with MRI of cervical spine to look at the potential for pinched nerve in your neck better

## 2020-07-12 ENCOUNTER — Encounter (HOSPITAL_COMMUNITY): Payer: Self-pay

## 2020-07-12 ENCOUNTER — Emergency Department (HOSPITAL_COMMUNITY)
Admission: EM | Admit: 2020-07-12 | Discharge: 2020-07-12 | Disposition: A | Discharge status: Home / Self Care | Payer: Medicare Other | Attending: Emergency Medicine | Admitting: Emergency Medicine

## 2020-07-12 ENCOUNTER — Other Ambulatory Visit: Payer: Self-pay

## 2020-07-12 DIAGNOSIS — Z87891 Personal history of nicotine dependence: Secondary | ICD-10-CM | POA: Diagnosis not present

## 2020-07-12 DIAGNOSIS — I10 Essential (primary) hypertension: Secondary | ICD-10-CM | POA: Insufficient documentation

## 2020-07-12 DIAGNOSIS — R509 Fever, unspecified: Secondary | ICD-10-CM | POA: Diagnosis not present

## 2020-07-12 DIAGNOSIS — Z79899 Other long term (current) drug therapy: Secondary | ICD-10-CM | POA: Diagnosis not present

## 2020-07-12 DIAGNOSIS — Z20822 Contact with and (suspected) exposure to covid-19: Secondary | ICD-10-CM | POA: Insufficient documentation

## 2020-07-12 LAB — URINALYSIS, ROUTINE W REFLEX MICROSCOPIC
Bilirubin Urine: NEGATIVE
Glucose, UA: NEGATIVE mg/dL
Hgb urine dipstick: NEGATIVE
Ketones, ur: NEGATIVE mg/dL
Leukocytes,Ua: NEGATIVE
Nitrite: NEGATIVE
Protein, ur: NEGATIVE mg/dL
Specific Gravity, Urine: 1.01 (ref 1.005–1.030)
pH: 7 (ref 5.0–8.0)

## 2020-07-12 LAB — CBC
HCT: 28.1 % — ABNORMAL LOW (ref 36.0–46.0)
Hemoglobin: 9.5 g/dL — ABNORMAL LOW (ref 12.0–15.0)
MCH: 31.5 pg (ref 26.0–34.0)
MCHC: 33.8 g/dL (ref 30.0–36.0)
MCV: 93 fL (ref 80.0–100.0)
Platelets: 203 10*3/uL (ref 150–400)
RBC: 3.02 MIL/uL — ABNORMAL LOW (ref 3.87–5.11)
RDW: 14.6 % (ref 11.5–15.5)
WBC: 6.5 10*3/uL (ref 4.0–10.5)
nRBC: 0 % (ref 0.0–0.2)

## 2020-07-12 LAB — COMPREHENSIVE METABOLIC PANEL
ALT: 265 U/L — ABNORMAL HIGH (ref 0–44)
AST: 86 U/L — ABNORMAL HIGH (ref 15–41)
Albumin: 3.5 g/dL (ref 3.5–5.0)
Alkaline Phosphatase: 383 U/L — ABNORMAL HIGH (ref 38–126)
Anion gap: 12 (ref 5–15)
BUN: 37 mg/dL — ABNORMAL HIGH (ref 6–20)
CO2: 28 mmol/L (ref 22–32)
Calcium: 9.7 mg/dL (ref 8.9–10.3)
Chloride: 96 mmol/L — ABNORMAL LOW (ref 98–111)
Creatinine, Ser: 1.68 mg/dL — ABNORMAL HIGH (ref 0.44–1.00)
GFR calc Af Amer: 38 mL/min — ABNORMAL LOW (ref >60)
GFR calc non Af Amer: 33 mL/min — ABNORMAL LOW (ref >60)
Glucose, Bld: 104 mg/dL — ABNORMAL HIGH (ref 70–99)
Potassium: 4 mmol/L (ref 3.5–5.1)
Sodium: 136 mmol/L (ref 135–145)
Total Bilirubin: 1 mg/dL (ref 0.3–1.2)
Total Protein: 7.8 g/dL (ref 6.5–8.1)

## 2020-07-12 LAB — SARS CORONAVIRUS 2 BY RT PCR (HOSPITAL ORDER, PERFORMED IN ~~LOC~~ HOSPITAL LAB): SARS Coronavirus 2: NEGATIVE

## 2020-07-12 LAB — LIPASE, BLOOD: Lipase: 19 U/L (ref 11–51)

## 2020-07-12 MED ORDER — HYDROMORPHONE HCL 1 MG/ML IJ SOLN
1.0000 mg | Freq: Once | INTRAMUSCULAR | Status: AC
  Administered 2020-07-12: 1 mg via INTRAVENOUS
  Filled 2020-07-12: qty 1

## 2020-07-12 NOTE — Discharge Instructions (Addendum)
Your testing today did not show an obvious source of your fever.  I briefly discussed her case with infectious disease.  We are keeping off antibiotics for the time being.  Blood cultures will be sent.  Either call your PCP, infectious disease or return to the emergency room if you feel like your symptoms are significantly worsening.

## 2020-07-12 NOTE — ED Triage Notes (Addendum)
Pt sts fevers x 2 weeks. Has hx chrons and was instructed here by PCP if fever got higher. Pt has PICC line and has TPN running 16 hrs a day. Fever 102.1 at home and took 1024m tylenol at 2330.

## 2020-07-12 NOTE — ED Notes (Signed)
Unable to collect labs because patient stated she has something in her leg to collect labs from

## 2020-07-17 LAB — CULTURE, BLOOD (ROUTINE X 2)
Culture: NO GROWTH
Special Requests: ADEQUATE

## 2020-07-17 NOTE — ED Provider Notes (Signed)
[facility] DEPT Provider Note   CSN: [id number] Arrival date & time: 07/12/20  0024     History Chief Complaint  Patient presents with  . Fever    Caitlin Peters is a 59 y.o. female.  HPI   65yF with fever. Over 101 yesterday. Otherwise feeling ok. Complicated PMHx and chronic pain. Crohn's with chronic TPN. Recurrent bacteremia and line infections. L currently in L thigh which she has no complaints about. No cough or dyspnea. No urinary complaints.   Past Medical History:  Diagnosis Date  . Acute pancreatitis 04/13/2020  . Anasarca 10/2019  . AVN (avascular necrosis of bone) ([place])   . Cataract   . Chronic pain syndrome   . CKD (chronic kidney disease), stage III   . Crohn disease ([place])   . Crohn disease ([place])   . Depression   . Diverticulosis   . GERD (gastroesophageal reflux disease)   . HTN (hypertension)   . IDA (iron deficiency anemia)   . Malnutrition ([place])   . Mass in chest   . Osteoporosis   . Pancreatitis   . Short gut syndrome   . Vitamin B12 deficiency     Patient Active Problem List   Diagnosis Date Noted  . Anxiety 06/03/2020  . Acute pancreatitis 04/13/2020  . Infection due to Stenotrophomonas maltophilia 03/10/2020  . Abnormal LFTs 03/05/2020  . Pancytopenia ([place]) 03/05/2020  . Stage 3b chronic kidney disease 03/05/2020  . Infection of peripherally inserted central venous catheter (PICC)   . On total parenteral nutrition (TPN) 01/02/2020  . Elevated liver enzymes 01/02/2020  . Cholangitis   . Anasarca 10/28/2019  . Acute kidney injury superimposed on chronic kidney disease ([place]) 10/28/2019  . Falls 10/28/2019  . Crohn disease ([place])   . Malnutrition ([place])   . Short gut syndrome   . HTN (hypertension)   . Vitamin B12 deficiency   . Osteoporosis   . Depression   . GERD (gastroesophageal reflux disease)   . Chronic pain syndrome   . Hypokalemia due to excessive gastrointestinal loss of potassium 10/13/2019    . Diarrhea   . Fever   . Hypokalemia 10/12/2019  . Chronic diarrhea 10/12/2019  . Dysuria 10/12/2019  . Bilateral lower extremity edema 10/12/2019  . Crohn's colitis ([place]) 10/12/2019  . Anemia 10/12/2019  . Hypertension 10/12/2019  . AKI (acute kidney injury) ([place]) 10/12/2019   Past Surgical History:  Procedure Laterality Date  . ABDOMINAL HYSTERECTOMY    . ABDOMINAL SURGERY     colon resection with abdominal stoma, repair of intestinal leak, reversal of abdominal stoma  . BILIARY DILATION  11/26/2019   Procedure: BILIARY DILATION;  Surgeon: Jackquline Denmark, MD;  Location: [facility];  Service: Endoscopy;;  . BILIARY DILATION  03/08/2020   Procedure: BILIARY DILATION;  Surgeon: Irving Copas., MD;  Location: [facility];  Service: Gastroenterology;;  . BIOPSY  03/08/2020   Procedure: BIOPSY;  Surgeon: Irving Copas., MD;  Location: [facility];  Service: Gastroenterology;;  . CHEST WALL RESECTION     right thoracotomy,resection of chest mass with anterior rib and reconstruction using prosthetic mesh and video arthroscopy  . CHOLECYSTECTOMY    . COLONOSCOPY  2019  . ERCP N/A 11/26/2019   Procedure: ENDOSCOPIC RETROGRADE CHOLANGIOPANCREATOGRAPHY (ERCP);  Surgeon: Jackquline Denmark, MD;  Location: [facility];  Service: Endoscopy;  Laterality: N/A;  . ERCP N/A 03/08/2020   Procedure: ENDOSCOPIC RETROGRADE CHOLANGIOPANCREATOGRAPHY (ERCP);  Surgeon: Rush Landmark Telford Nab., MD;  Location: [facility];  Service: Gastroenterology;  Laterality: N/A;  . ESOPHAGOGASTRODUODENOSCOPY N/A 03/08/2020   Procedure: ESOPHAGOGASTRODUODENOSCOPY (EGD);  Surgeon: Irving Copas., MD;  Location: [facility];  Service: Gastroenterology;  Laterality: N/A;  . EUS N/A 03/08/2020   Procedure: UPPER ENDOSCOPIC ULTRASOUND (EUS) LINEAR;  Surgeon: Irving Copas., MD;  Location: [facility];  Service: Gastroenterology;  Laterality: N/A;  . IR FLUORO GUIDE CV LINE LEFT  01/07/2020   . IR FLUORO GUIDE CV LINE LEFT  03/09/2020  . IR FLUORO GUIDE CV LINE LEFT  05/09/2020  . IR PTA VENOUS EXCEPT DIALYSIS CIRCUIT  01/07/2020  . IR US GUIDE VASC ACCESS LEFT     x 2 06/17/19 and 09/14/2019  . KNEE SURGERY     right knee   . REMOVAL OF STONES  11/26/2019   Procedure: REMOVAL OF STONES;  Surgeon: Jackquline Denmark, MD;  Location: [facility];  Service: Endoscopy;;  . REMOVAL OF STONES  03/08/2020   Procedure: REMOVAL OF STONES;  Surgeon: Irving Copas., MD;  Location: [facility];  Service: Gastroenterology;;  . SMALL INTESTINE SURGERY     x3  . SPHINCTEROTOMY  11/26/2019   Procedure: SPHINCTEROTOMY;  Surgeon: Jackquline Denmark, MD;  Location: [facility];  Service: Endoscopy;;  . UPPER GASTROINTESTINAL ENDOSCOPY      OB History   No obstetric history on file.    Family History  Problem Relation Age of Onset  . Breast cancer Sister   . Multiple sclerosis Sister   . Diabetes Sister   . Lupus Sister   . Colon cancer Other   . Crohn's disease Other   . Seizures Mother   . Glaucoma Mother   . CAD Father   . Heart disease Father   . Hypertension Father    Social History   Tobacco Use  . Smoking status: Former Research scientist (life sciences)  . Smokeless tobacco: Never Used  Vaping Use  . Vaping Use: Never used  Substance Use Topics  . Alcohol use: Not Currently  . Drug use: Never    Home Medications Prior to Admission medications   Medication Sig Start Date End Date Taking? Authorizing Provider  acetaminophen (TYLENOL) 325 MG tablet Take 650 mg by mouth every 6 (six) hours as needed for mild pain.     [provider]  amLODipine (NORVASC) 10 MG tablet Take 1 tablet (10 mg total) by mouth daily. 06/23/20 07/23/20  [name], [name], MD  budesonide (ENTOCORT EC) 3 MG 24 hr capsule TAKE 3 CAPSULES BY MOUTH ONCE DAILY Patient taking differently: Take 3 mg by mouth daily.  04/05/20   [name], [name], MD  buPROPion [facility] SR) 100 MG 12 hr tablet Take 2  tablets by mouth once daily 07/04/20   [name], [name], MD  calcium carbonate (TUMS - DOSED IN MG ELEMENTAL CALCIUM) 500 MG chewable tablet Chew 1 tablet by mouth 3 (three) times daily as needed for indigestion or heartburn.    [provider]  cholecalciferol (VITAMIN D3) 25 MCG (1000 UT) tablet Take 1,000 Units by mouth daily.     [provider]  cycloSPORINE (RESTASIS) 0.05 % ophthalmic emulsion Place 1 drop into both eyes 2 (two) times daily.    [provider]  dexlansoprazole (DEXILANT) 60 MG capsule Take 1 capsule (60 mg total) by mouth daily. 06/28/20   [name], [name], MD  Dextran 70-Hypromellose 0.1-0.3 % SOLN Place 1 drop into both eyes 4 (four) times daily.    [provider]  dicyclomine (BENTYL) 20 MG tablet Take 20 mg by mouth 3 (three) times daily. 10/26/19   [provider]  diphenoxylate-atropine (LOMOTIL) 2.5-0.025 MG tablet Take 1 tablet by mouth 4 (four) times daily as needed for diarrhea or loose stools. 05/24/20   [name], [name], MD  DULoxetine (CYMBALTA) 30 MG capsule Take 3 capsules (90 mg total) by mouth daily. 05/24/20   [name], [name], MD  estradiol (ESTRACE) 2 MG tablet Take 1 tablet (2 mg total) by mouth daily. 06/28/20   [name], [name], MD  famotidine (PEPCID) 20 MG tablet Take 20 mg by mouth at bedtime.    [provider]  hydrALAZINE (APRESOLINE) 50 MG tablet Take 1 tablet (50 mg total) by mouth 3 (three) times daily. 06/28/20   [name], [name], MD  HYDROmorphone (DILAUDID) 4 MG tablet Take 1 tablet (4 mg total) by mouth every 6 (six) hours as needed for severe pain. 06/30/20   Florencia Reasons, MD  lipase/protease/amylase (CREON) 36000 UNITS CPEP capsule Take 1 capsule (36,000 Units total) by mouth 3 (three) times daily as needed (with meals for digestion). 06/28/20   [name], [name], MD  loperamide (IMODIUM) 2 MG capsule TAKE 1 CAPSULE BY MOUTH AS NEEDED  FOR DIARRHEA OR&nbsp;&nbsp;LOOSE&nbsp;&nbsp;STOOLS 07/04/20   [name], [name], MD  methadone (DOLOPHINE) 5 MG tablet Take 1 tablet (5 mg total) by mouth 5 (five) times daily. 06/30/20   Florencia Reasons, MD  metoprolol tartrate (LOPRESSOR) 100 MG tablet Take 1 tablet (100 mg total) by mouth 2 (two) times daily. 06/28/20   [name], [name], MD  Multiple Vitamins-Minerals (MULTIVITAMIN ADULT PO) Take 1 tablet by mouth daily.    [provider]  Omega-3 1000 MG CAPS Take 1,000 mg by mouth daily.     [provider]  potassium chloride SA (KLOR-CON) 20 MEQ tablet Take 2 tablets (40 mEq total) by mouth daily. 06/28/20   Erline Hau, MD  PRESCRIPTION MEDICATION Inject 1 each into the vein daily. Home TPN . [facility] [place] . 1 bag for 16 hours. [phone]    [provider]  Probiotic Product (PROBIOTIC-10 PO) Take 1 capsule by mouth daily.     [provider]  promethazine (PHENERGAN) 25 MG tablet Take 1 tablet (25 mg total) by mouth every 6 (six) hours as needed for nausea or vomiting. TAKE 1 TABLET BY MOUTH EVERY 6 HOURS AS NEEDED FOR NAUSEA AND VOMITING 06/28/20   [name], [name], MD  sucralfate (CARAFATE) 1 GM/10ML suspension Take 10 mLs (1 g total) by mouth 4 (four) times daily -  with meals and at bedtime. 06/28/20   [name], [name], MD  traZODone (DESYREL) 100 MG tablet Take 2 tablets (200 mg total) by mouth at bedtime as needed for sleep. 06/28/20   [name], [name], MD  vitamin B-12 (CYANOCOBALAMIN) 100 MCG tablet Take 1,000 mcg by mouth daily.     [provider]    Allergies    Cefepime, Gabapentin, Hyoscyamine, Lyrica [pregabalin], Meperidine, Topamax [topiramate], Fentanyl, and Morphine and related  Review of Systems   Review of Systems All systems reviewed and negative, other than as noted in HPI.  Physical Exam Updated Vital Signs BP (!) 155/76   Pulse 81   Temp 98.5  F (36.9 C) (Oral)   Resp 17   Ht 5' 8"  (1.727 m)   Wt 66.2 kg   SpO2 100%  BMI 22.20 kg/m   Physical Exam Vitals and nursing note reviewed.  Constitutional:      General: She is not in acute distress.    Appearance: She is well-developed.  HENT:     Head: Normocephalic and atraumatic.  Eyes:     General:        Right eye: No discharge.        Left eye: No discharge.     Conjunctiva/sclera: Conjunctivae normal.  Cardiovascular:     Rate and Rhythm: Normal rate and regular rhythm.     Heart sounds: Normal heart sounds. No murmur heard.  No friction rub. No gallop.      Comments: Line L thigh clean and dry.  Pulmonary:     Effort: Pulmonary effort is normal. No respiratory distress.     Breath sounds: Normal breath sounds.  Abdominal:     General: There is no distension.     Palpations: Abdomen is soft.     Tenderness: There is no abdominal tenderness.  Musculoskeletal:        General: No tenderness.     Cervical back: Neck supple.  Skin:    General: Skin is warm and dry.  Neurological:     Mental Status: She is alert.  Psychiatric:        Behavior: Behavior normal.        Thought Content: Thought content normal.     ED Results / Procedures / Treatments   Labs (all labs ordered are listed, but only abnormal results are displayed) Labs Reviewed  CBC - Abnormal; Notable for the following components:      Result Value   RBC 3.02 (*)    Hemoglobin 9.5 (*)    HCT 28.1 (*)    All other components within normal limits  COMPREHENSIVE METABOLIC PANEL - Abnormal; Notable for the following components:   Chloride 96 (*)    Glucose, Bld 104 (*)    BUN 37 (*)    Creatinine, Ser 1.68 (*)    AST 86 (*)    ALT 265 (*)    Alkaline Phosphatase 383 (*)    GFR calc non Af Amer 33 (*)    GFR calc Af Amer 38 (*)    All other components within normal limits  CULTURE, BLOOD (ROUTINE X 2)  SARS CORONAVIRUS 2 BY RT PCR (HOSPITAL ORDER, [facility])    URINALYSIS, ROUTINE W REFLEX MICROSCOPIC  LIPASE, BLOOD  I-STAT BETA HCG BLOOD, ED ([facility], [facility], [facility] ONLY)    EKG None  Radiology No results found.  Procedures Procedures (including critical care time)  Medications Ordered in ED Medications  HYDROmorphone (DILAUDID) injection 1 mg (1 mg Intravenous Given 07/12/20 1214)  HYDROmorphone (DILAUDID) injection 1 mg (1 mg Intravenous Given 07/12/20 1323)    ED Course  I have reviewed the triage vital signs and the nursing notes.  Pertinent labs & imaging results that were available during my care of the patient were reviewed by me and considered in my medical decision making (see chart for details).    MDM Rules/Calculators/A&P                          59yF with fever. Afebrile in ED. Complicated hx with chronic TPN, recurrent bacteremia and line infections. Line looks fine externally. NO clear source of infection at this time. Will obtain blood cultures. Deferred from abx at this time.    Final  Clinical Impression(s) / ED Diagnoses Final diagnoses:  Fever, unspecified fever cause    Rx / DC Orders ED Discharge Orders    None       Virgel Manifold, MD 07/17/20 1526

## 2020-07-19 ENCOUNTER — Emergency Department (HOSPITAL_COMMUNITY): Payer: Medicare Other

## 2020-07-19 ENCOUNTER — Inpatient Hospital Stay (HOSPITAL_COMMUNITY): Payer: Medicare Other

## 2020-07-19 ENCOUNTER — Encounter (HOSPITAL_COMMUNITY): Payer: Self-pay | Admitting: Emergency Medicine

## 2020-07-19 ENCOUNTER — Other Ambulatory Visit: Payer: Self-pay

## 2020-07-19 ENCOUNTER — Inpatient Hospital Stay (HOSPITAL_COMMUNITY)
Admission: EM | Admit: 2020-07-19 | Discharge: 2020-07-23 | DRG: 314 | Disposition: A | Discharge status: Home / Self Care | Payer: Medicare Other | Attending: Internal Medicine | Admitting: Internal Medicine

## 2020-07-19 DIAGNOSIS — Z87440 Personal history of urinary (tract) infections: Secondary | ICD-10-CM

## 2020-07-19 DIAGNOSIS — D631 Anemia in chronic kidney disease: Secondary | ICD-10-CM | POA: Diagnosis present

## 2020-07-19 DIAGNOSIS — N179 Acute kidney failure, unspecified: Secondary | ICD-10-CM | POA: Diagnosis present

## 2020-07-19 DIAGNOSIS — K7689 Other specified diseases of liver: Secondary | ICD-10-CM | POA: Diagnosis not present

## 2020-07-19 DIAGNOSIS — N189 Chronic kidney disease, unspecified: Secondary | ICD-10-CM

## 2020-07-19 DIAGNOSIS — G9341 Metabolic encephalopathy: Secondary | ICD-10-CM | POA: Diagnosis present

## 2020-07-19 DIAGNOSIS — R6510 Systemic inflammatory response syndrome (SIRS) of non-infectious origin without acute organ dysfunction: Secondary | ICD-10-CM | POA: Diagnosis present

## 2020-07-19 DIAGNOSIS — R7989 Other specified abnormal findings of blood chemistry: Secondary | ICD-10-CM | POA: Diagnosis not present

## 2020-07-19 DIAGNOSIS — K912 Postsurgical malabsorption, not elsewhere classified: Secondary | ICD-10-CM | POA: Diagnosis present

## 2020-07-19 DIAGNOSIS — M810 Age-related osteoporosis without current pathological fracture: Secondary | ICD-10-CM | POA: Diagnosis present

## 2020-07-19 DIAGNOSIS — R509 Fever, unspecified: Secondary | ICD-10-CM

## 2020-07-19 DIAGNOSIS — R17 Unspecified jaundice: Secondary | ICD-10-CM | POA: Diagnosis present

## 2020-07-19 DIAGNOSIS — T80211A Bloodstream infection due to central venous catheter, initial encounter: Secondary | ICD-10-CM | POA: Diagnosis present

## 2020-07-19 DIAGNOSIS — Z803 Family history of malignant neoplasm of breast: Secondary | ICD-10-CM | POA: Diagnosis not present

## 2020-07-19 DIAGNOSIS — A4159 Other Gram-negative sepsis: Secondary | ICD-10-CM

## 2020-07-19 DIAGNOSIS — K759 Inflammatory liver disease, unspecified: Secondary | ICD-10-CM | POA: Diagnosis not present

## 2020-07-19 DIAGNOSIS — Z79891 Long term (current) use of opiate analgesic: Secondary | ICD-10-CM

## 2020-07-19 DIAGNOSIS — Z20822 Contact with and (suspected) exposure to covid-19: Secondary | ICD-10-CM | POA: Diagnosis present

## 2020-07-19 DIAGNOSIS — Z820 Family history of epilepsy and other diseases of the nervous system: Secondary | ICD-10-CM

## 2020-07-19 DIAGNOSIS — Z888 Allergy status to other drugs, medicaments and biological substances status: Secondary | ICD-10-CM | POA: Diagnosis not present

## 2020-07-19 DIAGNOSIS — Z885 Allergy status to narcotic agent status: Secondary | ICD-10-CM

## 2020-07-19 DIAGNOSIS — F17210 Nicotine dependence, cigarettes, uncomplicated: Secondary | ICD-10-CM | POA: Diagnosis present

## 2020-07-19 DIAGNOSIS — E860 Dehydration: Secondary | ICD-10-CM | POA: Diagnosis not present

## 2020-07-19 DIAGNOSIS — F419 Anxiety disorder, unspecified: Secondary | ICD-10-CM | POA: Diagnosis present

## 2020-07-19 DIAGNOSIS — R748 Abnormal levels of other serum enzymes: Secondary | ICD-10-CM

## 2020-07-19 DIAGNOSIS — I129 Hypertensive chronic kidney disease with stage 1 through stage 4 chronic kidney disease, or unspecified chronic kidney disease: Secondary | ICD-10-CM | POA: Diagnosis present

## 2020-07-19 DIAGNOSIS — T827XXA Infection and inflammatory reaction due to other cardiac and vascular devices, implants and grafts, initial encounter: Secondary | ICD-10-CM

## 2020-07-19 DIAGNOSIS — Z9049 Acquired absence of other specified parts of digestive tract: Secondary | ICD-10-CM

## 2020-07-19 DIAGNOSIS — K5090 Crohn's disease, unspecified, without complications: Secondary | ICD-10-CM | POA: Diagnosis present

## 2020-07-19 DIAGNOSIS — Z8249 Family history of ischemic heart disease and other diseases of the circulatory system: Secondary | ICD-10-CM

## 2020-07-19 DIAGNOSIS — F329 Major depressive disorder, single episode, unspecified: Secondary | ICD-10-CM | POA: Diagnosis present

## 2020-07-19 DIAGNOSIS — Z90710 Acquired absence of both cervix and uterus: Secondary | ICD-10-CM | POA: Diagnosis not present

## 2020-07-19 DIAGNOSIS — Z833 Family history of diabetes mellitus: Secondary | ICD-10-CM

## 2020-07-19 DIAGNOSIS — Z79899 Other long term (current) drug therapy: Secondary | ICD-10-CM

## 2020-07-19 DIAGNOSIS — Y838 Other surgical procedures as the cause of abnormal reaction of the patient, or of later complication, without mention of misadventure at the time of the procedure: Secondary | ICD-10-CM | POA: Diagnosis present

## 2020-07-19 DIAGNOSIS — D72825 Bandemia: Secondary | ICD-10-CM | POA: Diagnosis not present

## 2020-07-19 DIAGNOSIS — Z800 Family history of malignant neoplasm of digestive organs: Secondary | ICD-10-CM

## 2020-07-19 DIAGNOSIS — G8929 Other chronic pain: Secondary | ICD-10-CM | POA: Diagnosis present

## 2020-07-19 DIAGNOSIS — Z8719 Personal history of other diseases of the digestive system: Secondary | ICD-10-CM | POA: Diagnosis not present

## 2020-07-19 DIAGNOSIS — Z832 Family history of diseases of the blood and blood-forming organs and certain disorders involving the immune mechanism: Secondary | ICD-10-CM

## 2020-07-19 DIAGNOSIS — N1832 Chronic kidney disease, stage 3b: Secondary | ICD-10-CM | POA: Diagnosis present

## 2020-07-19 DIAGNOSIS — K219 Gastro-esophageal reflux disease without esophagitis: Secondary | ICD-10-CM | POA: Diagnosis present

## 2020-07-19 DIAGNOSIS — Z83511 Family history of glaucoma: Secondary | ICD-10-CM

## 2020-07-19 DIAGNOSIS — G894 Chronic pain syndrome: Secondary | ICD-10-CM | POA: Diagnosis present

## 2020-07-19 DIAGNOSIS — R7881 Bacteremia: Secondary | ICD-10-CM

## 2020-07-19 DIAGNOSIS — R4182 Altered mental status, unspecified: Secondary | ICD-10-CM

## 2020-07-19 LAB — COMPREHENSIVE METABOLIC PANEL
ALT: 332 U/L — ABNORMAL HIGH (ref 0–44)
AST: 245 U/L — ABNORMAL HIGH (ref 15–41)
Albumin: 3.3 g/dL — ABNORMAL LOW (ref 3.5–5.0)
Alkaline Phosphatase: 646 U/L — ABNORMAL HIGH (ref 38–126)
Anion gap: 15 (ref 5–15)
BUN: 39 mg/dL — ABNORMAL HIGH (ref 6–20)
CO2: 24 mmol/L (ref 22–32)
Calcium: 9.7 mg/dL (ref 8.9–10.3)
Chloride: 96 mmol/L — ABNORMAL LOW (ref 98–111)
Creatinine, Ser: 2.24 mg/dL — ABNORMAL HIGH (ref 0.44–1.00)
GFR calc Af Amer: 27 mL/min — ABNORMAL LOW (ref >60)
GFR calc non Af Amer: 23 mL/min — ABNORMAL LOW (ref >60)
Glucose, Bld: 90 mg/dL (ref 70–99)
Potassium: 4.4 mmol/L (ref 3.5–5.1)
Sodium: 135 mmol/L (ref 135–145)
Total Bilirubin: 2.4 mg/dL — ABNORMAL HIGH (ref 0.3–1.2)
Total Protein: 8 g/dL (ref 6.5–8.1)

## 2020-07-19 LAB — CBC WITH DIFFERENTIAL/PLATELET
Abs Immature Granulocytes: 0.06 10*3/uL (ref 0.00–0.07)
Basophils Absolute: 0 10*3/uL (ref 0.0–0.1)
Basophils Relative: 0 %
Eosinophils Absolute: 0 10*3/uL (ref 0.0–0.5)
Eosinophils Relative: 0 %
HCT: 26.3 % — ABNORMAL LOW (ref 36.0–46.0)
Hemoglobin: 8.4 g/dL — ABNORMAL LOW (ref 12.0–15.0)
Immature Granulocytes: 1 %
Lymphocytes Relative: 7 %
Lymphs Abs: 0.9 10*3/uL (ref 0.7–4.0)
MCH: 29.6 pg (ref 26.0–34.0)
MCHC: 31.9 g/dL (ref 30.0–36.0)
MCV: 92.6 fL (ref 80.0–100.0)
Monocytes Absolute: 0.8 10*3/uL (ref 0.1–1.0)
Monocytes Relative: 6 %
Neutro Abs: 10.5 10*3/uL — ABNORMAL HIGH (ref 1.7–7.7)
Neutrophils Relative %: 86 %
Platelets: 209 10*3/uL (ref 150–400)
RBC: 2.84 MIL/uL — ABNORMAL LOW (ref 3.87–5.11)
RDW: 14.3 % (ref 11.5–15.5)
WBC: 12.2 10*3/uL — ABNORMAL HIGH (ref 4.0–10.5)
nRBC: 0 % (ref 0.0–0.2)

## 2020-07-19 LAB — AMMONIA: Ammonia: 55 umol/L — ABNORMAL HIGH (ref 9–35)

## 2020-07-19 LAB — LACTIC ACID, PLASMA: Lactic Acid, Venous: 1.1 mmol/L (ref 0.5–1.9)

## 2020-07-19 LAB — TSH: TSH: 0.297 u[IU]/mL — ABNORMAL LOW (ref 0.350–4.500)

## 2020-07-19 LAB — SARS CORONAVIRUS 2 BY RT PCR (HOSPITAL ORDER, PERFORMED IN ~~LOC~~ HOSPITAL LAB): SARS Coronavirus 2: NEGATIVE

## 2020-07-19 MED ORDER — CYCLOSPORINE 0.05 % OP EMUL
1.0000 [drp] | Freq: Two times a day (BID) | OPHTHALMIC | Status: DC
  Administered 2020-07-20 – 2020-07-23 (×5): 1 [drp] via OPHTHALMIC
  Filled 2020-07-19 (×8): qty 1

## 2020-07-19 MED ORDER — DULOXETINE HCL 60 MG PO CPEP
90.0000 mg | ORAL_CAPSULE | Freq: Every day | ORAL | Status: DC
  Administered 2020-07-20 – 2020-07-23 (×4): 90 mg via ORAL
  Filled 2020-07-19 (×4): qty 1

## 2020-07-19 MED ORDER — LACTATED RINGERS IV SOLN: INTRAVENOUS | Status: AC

## 2020-07-19 MED ORDER — POTASSIUM CHLORIDE CRYS ER 20 MEQ PO TBCR
40.0000 meq | EXTENDED_RELEASE_TABLET | Freq: Every day | ORAL | Status: DC
  Administered 2020-07-20 – 2020-07-23 (×4): 40 meq via ORAL
  Filled 2020-07-19 (×4): qty 2

## 2020-07-19 MED ORDER — PANCRELIPASE (LIP-PROT-AMYL) 12000-38000 UNITS PO CPEP
36000.0000 [IU] | ORAL_CAPSULE | Freq: Three times a day (TID) | ORAL | Status: DC | PRN
  Administered 2020-07-22 – 2020-07-23 (×2): 36000 [IU] via ORAL
  Filled 2020-07-19: qty 2
  Filled 2020-07-19 (×3): qty 3

## 2020-07-19 MED ORDER — METRONIDAZOLE IN NACL 5-0.79 MG/ML-% IV SOLN
500.0000 mg | Freq: Once | INTRAVENOUS | Status: AC
  Administered 2020-07-19: 500 mg via INTRAVENOUS
  Filled 2020-07-19: qty 100

## 2020-07-19 MED ORDER — ACETAMINOPHEN 325 MG PO TABS: 650.0000 mg | ORAL_TABLET | Freq: Four times a day (QID) | ORAL | Status: DC | PRN

## 2020-07-19 MED ORDER — BUDESONIDE 3 MG PO CPEP
9.0000 mg | ORAL_CAPSULE | Freq: Every day | ORAL | Status: DC
  Administered 2020-07-20 – 2020-07-23 (×4): 9 mg via ORAL
  Filled 2020-07-19 (×4): qty 3

## 2020-07-19 MED ORDER — OXYCODONE HCL 5 MG PO TABS
5.0000 mg | ORAL_TABLET | ORAL | Status: DC | PRN
  Administered 2020-07-19: 5 mg via ORAL
  Filled 2020-07-19 (×2): qty 1

## 2020-07-19 MED ORDER — TRAZODONE HCL 100 MG PO TABS: 200.0000 mg | ORAL_TABLET | Freq: Every evening | ORAL | Status: DC | PRN

## 2020-07-19 MED ORDER — ONDANSETRON HCL 4 MG PO TABS: 4.0000 mg | ORAL_TABLET | Freq: Four times a day (QID) | ORAL | Status: DC | PRN

## 2020-07-19 MED ORDER — METRONIDAZOLE IN NACL 5-0.79 MG/ML-% IV SOLN
500.0000 mg | Freq: Three times a day (TID) | INTRAVENOUS | Status: DC
  Administered 2020-07-19 – 2020-07-20 (×2): 500 mg via INTRAVENOUS
  Filled 2020-07-19 (×2): qty 100

## 2020-07-19 MED ORDER — CALCIUM CARBONATE ANTACID 500 MG PO CHEW: 1.0000 | CHEWABLE_TABLET | Freq: Three times a day (TID) | ORAL | Status: DC | PRN

## 2020-07-19 MED ORDER — LACTATED RINGERS IV BOLUS (SEPSIS)
1000.0000 mL | Freq: Once | INTRAVENOUS | Status: AC
  Administered 2020-07-19: 1000 mL via INTRAVENOUS

## 2020-07-19 MED ORDER — SODIUM CHLORIDE 0.9 % IV SOLN
2.0000 g | Freq: Once | INTRAVENOUS | Status: AC
  Administered 2020-07-19: 2 g via INTRAVENOUS
  Filled 2020-07-19: qty 2

## 2020-07-19 MED ORDER — RISAQUAD PO CAPS
1.0000 | ORAL_CAPSULE | Freq: Every day | ORAL | Status: DC
  Administered 2020-07-20 – 2020-07-23 (×4): 1 via ORAL
  Filled 2020-07-19 (×4): qty 1

## 2020-07-19 MED ORDER — METHADONE HCL 5 MG PO TABS
5.0000 mg | ORAL_TABLET | Freq: Every day | ORAL | Status: DC
  Administered 2020-07-19 – 2020-07-23 (×17): 5 mg via ORAL
  Filled 2020-07-19 (×19): qty 1

## 2020-07-19 MED ORDER — AMLODIPINE BESYLATE 10 MG PO TABS
10.0000 mg | ORAL_TABLET | Freq: Every day | ORAL | Status: DC
  Administered 2020-07-20 – 2020-07-23 (×4): 10 mg via ORAL
  Filled 2020-07-19 (×4): qty 1

## 2020-07-19 MED ORDER — DICYCLOMINE HCL 20 MG PO TABS
20.0000 mg | ORAL_TABLET | Freq: Three times a day (TID) | ORAL | Status: DC
  Administered 2020-07-19 – 2020-07-23 (×11): 20 mg via ORAL
  Filled 2020-07-19 (×12): qty 1

## 2020-07-19 MED ORDER — ADULT MULTIVITAMIN W/MINERALS CH
1.0000 | ORAL_TABLET | Freq: Every day | ORAL | Status: DC
  Administered 2020-07-20 – 2020-07-23 (×4): 1 via ORAL
  Filled 2020-07-19 (×4): qty 1

## 2020-07-19 MED ORDER — ENOXAPARIN SODIUM 30 MG/0.3ML ~~LOC~~ SOLN
30.0000 mg | SUBCUTANEOUS | Status: DC
  Administered 2020-07-19 – 2020-07-22 (×4): 30 mg via SUBCUTANEOUS
  Filled 2020-07-19 (×4): qty 0.3

## 2020-07-19 MED ORDER — METOPROLOL TARTRATE 50 MG PO TABS
100.0000 mg | ORAL_TABLET | Freq: Two times a day (BID) | ORAL | Status: DC
  Administered 2020-07-19 – 2020-07-23 (×8): 100 mg via ORAL
  Filled 2020-07-19 (×8): qty 2

## 2020-07-19 MED ORDER — SODIUM CHLORIDE 0.9 % IV SOLN: 2.0000 g | Freq: Once | INTRAVENOUS | Status: DC

## 2020-07-19 MED ORDER — PANTOPRAZOLE SODIUM 40 MG PO TBEC
40.0000 mg | DELAYED_RELEASE_TABLET | Freq: Every day | ORAL | Status: DC
  Administered 2020-07-20 – 2020-07-23 (×4): 40 mg via ORAL
  Filled 2020-07-19 (×4): qty 1

## 2020-07-19 MED ORDER — BUPROPION HCL ER (SR) 100 MG PO TB12
200.0000 mg | ORAL_TABLET | Freq: Every day | ORAL | Status: DC
  Administered 2020-07-20 – 2020-07-23 (×4): 200 mg via ORAL
  Filled 2020-07-19 (×4): qty 2

## 2020-07-19 MED ORDER — VITAMIN B-12 1000 MCG PO TABS
1000.0000 ug | ORAL_TABLET | Freq: Every day | ORAL | Status: DC
  Administered 2020-07-20 – 2020-07-23 (×4): 1000 ug via ORAL
  Filled 2020-07-19 (×4): qty 1

## 2020-07-19 MED ORDER — HYDROMORPHONE HCL 2 MG PO TABS
4.0000 mg | ORAL_TABLET | Freq: Four times a day (QID) | ORAL | Status: DC | PRN
  Administered 2020-07-20 – 2020-07-23 (×5): 4 mg via ORAL
  Filled 2020-07-19 (×5): qty 2

## 2020-07-19 MED ORDER — ESTRADIOL 1 MG PO TABS
2.0000 mg | ORAL_TABLET | Freq: Every day | ORAL | Status: DC
  Administered 2020-07-20 – 2020-07-23 (×4): 2 mg via ORAL
  Filled 2020-07-19 (×4): qty 2

## 2020-07-19 MED ORDER — VITAMIN D 25 MCG (1000 UNIT) PO TABS
1000.0000 [IU] | ORAL_TABLET | Freq: Every day | ORAL | Status: DC
  Administered 2020-07-20 – 2020-07-23 (×4): 1000 [IU] via ORAL
  Filled 2020-07-19 (×4): qty 1

## 2020-07-19 MED ORDER — SODIUM CHLORIDE 0.9 % IV SOLN: 2.0000 g | INTRAVENOUS | Status: DC

## 2020-07-19 MED ORDER — ONDANSETRON HCL 4 MG/2ML IJ SOLN
4.0000 mg | Freq: Four times a day (QID) | INTRAMUSCULAR | Status: DC | PRN
  Administered 2020-07-20 – 2020-07-23 (×7): 4 mg via INTRAVENOUS
  Filled 2020-07-19 (×7): qty 2

## 2020-07-19 MED ORDER — LOPERAMIDE HCL 2 MG PO CAPS: 2.0000 mg | ORAL_CAPSULE | ORAL | Status: DC | PRN

## 2020-07-19 MED ORDER — DEXTRAN 70-HYPROMELLOSE 0.1-0.3 % OP SOLN
1.0000 [drp] | Freq: Four times a day (QID) | OPHTHALMIC | Status: DC
  Filled 2020-07-19: qty 1

## 2020-07-19 MED ORDER — HYDROMORPHONE HCL 1 MG/ML IJ SOLN
1.0000 mg | Freq: Once | INTRAMUSCULAR | Status: AC
  Administered 2020-07-19: 1 mg via INTRAVENOUS
  Filled 2020-07-19: qty 1

## 2020-07-19 MED ORDER — ACETAMINOPHEN 650 MG RE SUPP: 650.0000 mg | Freq: Four times a day (QID) | RECTAL | Status: DC | PRN

## 2020-07-19 MED ORDER — VANCOMYCIN VARIABLE DOSE PER UNSTABLE RENAL FUNCTION (PHARMACIST DOSING): Status: DC

## 2020-07-19 MED ORDER — VANCOMYCIN HCL IN DEXTROSE 1-5 GM/200ML-% IV SOLN
1000.0000 mg | Freq: Once | INTRAVENOUS | Status: AC
  Administered 2020-07-19: 1000 mg via INTRAVENOUS
  Filled 2020-07-19: qty 200

## 2020-07-19 MED ORDER — HYDRALAZINE HCL 50 MG PO TABS
50.0000 mg | ORAL_TABLET | Freq: Three times a day (TID) | ORAL | Status: DC
  Administered 2020-07-19 – 2020-07-23 (×11): 50 mg via ORAL
  Filled 2020-07-19 (×12): qty 1

## 2020-07-19 MED ORDER — ACETAMINOPHEN 325 MG PO TABS
650.0000 mg | ORAL_TABLET | Freq: Once | ORAL | Status: AC
  Administered 2020-07-19: 650 mg via ORAL
  Filled 2020-07-19: qty 2

## 2020-07-19 MED ORDER — VANCOMYCIN HCL 1500 MG/300ML IV SOLN
1500.0000 mg | Freq: Once | INTRAVENOUS | Status: AC
  Administered 2020-07-19: 1500 mg via INTRAVENOUS
  Filled 2020-07-19: qty 300

## 2020-07-19 NOTE — ED Provider Notes (Addendum)
[facility] DEPT Provider Note   CSN: [id number] Arrival date & time: 07/19/20  1311     History No chief complaint on file.   Caitlin Peters is a 59 y.o. female.  59 year old female with past medical history including Crohn's disease, short gut syndrome, CKD, chronic pain, TPN dependence who presents with fever and altered mental status.  Patient was brought in today by EMS for fever that reportedly started earlier today.  She has history of UTIs and line infections.  She reports that her fevers had stopped after she was discharged home the last time but then started back up recently.  She denies any problems at her line site in left leg. She denies cough or SOB. Has chronic diarrhea. Has had confusion which is abnormal for her.  LEVEL 5 CAVEAT DUE TO AMS  The history is provided by the patient and the EMS personnel. The history is limited by the condition of the patient.       Past Medical History:  Diagnosis Date  . Acute pancreatitis 04/13/2020  . Anasarca 10/2019  . AVN (avascular necrosis of bone) ([place])   . Cataract   . Chronic pain syndrome   . CKD (chronic kidney disease), stage III   . Crohn disease ([place])   . Crohn disease ([place])   . Depression   . Diverticulosis   . GERD (gastroesophageal reflux disease)   . HTN (hypertension)   . IDA (iron deficiency anemia)   . Malnutrition ([place])   . Mass in chest   . Osteoporosis   . Pancreatitis   . Short gut syndrome   . Vitamin B12 deficiency     Patient Active Problem List   Diagnosis Date Noted  . SIRS (systemic inflammatory response syndrome) ([place]) 07/19/2020  . Anxiety 06/03/2020  . Acute pancreatitis 04/13/2020  . Infection due to Stenotrophomonas maltophilia 03/10/2020  . Abnormal LFTs 03/05/2020  . Pancytopenia ([place]) 03/05/2020  . Stage 3b chronic kidney disease 03/05/2020  . Infection of peripherally inserted central venous catheter (PICC)   . On total parenteral nutrition  (TPN) 01/02/2020  . Elevated liver enzymes 01/02/2020  . Cholangitis   . Anasarca 10/28/2019  . Acute kidney injury superimposed on chronic kidney disease ([place]) 10/28/2019  . Falls 10/28/2019  . Crohn disease ([place])   . Malnutrition ([place])   . Short gut syndrome   . HTN (hypertension)   . Vitamin B12 deficiency   . Osteoporosis   . Depression   . GERD (gastroesophageal reflux disease)   . Chronic pain syndrome   . Hypokalemia due to excessive gastrointestinal loss of potassium 10/13/2019  . Diarrhea   . Fever   . Hypokalemia 10/12/2019  . Chronic diarrhea 10/12/2019  . Dysuria 10/12/2019  . Bilateral lower extremity edema 10/12/2019  . Crohn's colitis ([place]) 10/12/2019  . Anemia 10/12/2019  . Hypertension 10/12/2019  . AKI (acute kidney injury) ([place]) 10/12/2019    Past Surgical History:  Procedure Laterality Date  . ABDOMINAL HYSTERECTOMY    . ABDOMINAL SURGERY     colon resection with abdominal stoma, repair of intestinal leak, reversal of abdominal stoma  . BILIARY DILATION  11/26/2019   Procedure: BILIARY DILATION;  Surgeon: Jackquline Denmark, MD;  Location: [facility];  Service: Endoscopy;;  . BILIARY DILATION  03/08/2020   Procedure: BILIARY DILATION;  Surgeon: Irving Copas., MD;  Location: [place];  Service: Gastroenterology;;  . BIOPSY  03/08/2020   Procedure: BIOPSY;  Surgeon: Irving Copas.,  MD;  Location: [facility];  Service: Gastroenterology;;  . CHEST WALL RESECTION     right thoracotomy,resection of chest mass with anterior rib and reconstruction using prosthetic mesh and video arthroscopy  . CHOLECYSTECTOMY    . COLONOSCOPY  2019  . ERCP N/A 11/26/2019   Procedure: ENDOSCOPIC RETROGRADE CHOLANGIOPANCREATOGRAPHY (ERCP);  Surgeon: Jackquline Denmark, MD;  Location: [facility];  Service: Endoscopy;  Laterality: N/A;  . ERCP N/A 03/08/2020   Procedure: ENDOSCOPIC RETROGRADE CHOLANGIOPANCREATOGRAPHY (ERCP);  Surgeon: Irving Copas., MD;   Location: [facility];  Service: Gastroenterology;  Laterality: N/A;  . ESOPHAGOGASTRODUODENOSCOPY N/A 03/08/2020   Procedure: ESOPHAGOGASTRODUODENOSCOPY (EGD);  Surgeon: Irving Copas., MD;  Location: [place] [facility];  Service: Gastroenterology;  Laterality: N/A;  . EUS N/A 03/08/2020   Procedure: UPPER ENDOSCOPIC ULTRASOUND (EUS) LINEAR;  Surgeon: Irving Copas., MD;  Location: [facility];  Service: Gastroenterology;  Laterality: N/A;  . IR FLUORO GUIDE CV LINE LEFT  01/07/2020  . IR FLUORO GUIDE CV LINE LEFT  03/09/2020  . IR FLUORO GUIDE CV LINE LEFT  05/09/2020  . IR PTA VENOUS EXCEPT DIALYSIS CIRCUIT  01/07/2020  . IR US GUIDE VASC ACCESS LEFT     x 2 06/17/19 and 09/14/2019  . KNEE SURGERY     right knee   . REMOVAL OF STONES  11/26/2019   Procedure: REMOVAL OF STONES;  Surgeon: Jackquline Denmark, MD;  Location: [facility];  Service: Endoscopy;;  . REMOVAL OF STONES  03/08/2020   Procedure: REMOVAL OF STONES;  Surgeon: Irving Copas., MD;  Location: [employer];  Service: Gastroenterology;;  . SMALL INTESTINE SURGERY     x3  . SPHINCTEROTOMY  11/26/2019   Procedure: SPHINCTEROTOMY;  Surgeon: Jackquline Denmark, MD;  Location: [facility];  Service: Endoscopy;;  . UPPER GASTROINTESTINAL ENDOSCOPY       OB History   No obstetric history on file.     Family History  Problem Relation Age of Onset  . Breast cancer Sister   . Multiple sclerosis Sister   . Diabetes Sister   . Lupus Sister   . Colon cancer Other   . Crohn's disease Other   . Seizures Mother   . Glaucoma Mother   . CAD Father   . Heart disease Father   . Hypertension Father     Social History   Tobacco Use  . Smoking status: Former Research scientist (life sciences)  . Smokeless tobacco: Never Used  Vaping Use  . Vaping Use: Never used  Substance Use Topics  . Alcohol use: Not Currently  . Drug use: Never    Home Medications Prior to Admission medications   Medication Sig Start Date End Date Taking?  Authorizing Provider  acetaminophen (TYLENOL) 325 MG tablet Take 650 mg by mouth every 6 (six) hours as needed for mild pain.     [provider]  amLODipine (NORVASC) 10 MG tablet Take 1 tablet (10 mg total) by mouth daily. 06/23/20 07/23/20  [name], [name], MD  budesonide (ENTOCORT EC) 3 MG 24 hr capsule TAKE 3 CAPSULES BY MOUTH ONCE DAILY Patient taking differently: Take 3 mg by mouth daily.  04/05/20   [name], [name], MD  buPROPion [facility] SR) 100 MG 12 hr tablet Take 2 tablets by mouth once daily 07/04/20   [name], [name], MD  calcium carbonate (TUMS - DOSED IN MG ELEMENTAL CALCIUM) 500 MG chewable tablet Chew 1 tablet by mouth 3 (three) times daily as needed for indigestion or heartburn.  [provider]  cholecalciferol (VITAMIN D3) 25 MCG (1000 UT) tablet Take 1,000 Units by mouth daily.     [provider]  cycloSPORINE (RESTASIS) 0.05 % ophthalmic emulsion Place 1 drop into both eyes 2 (two) times daily.    [provider]  dexlansoprazole (DEXILANT) 60 MG capsule Take 1 capsule (60 mg total) by mouth daily. 06/28/20   [name], [name], MD  Dextran 70-Hypromellose 0.1-0.3 % SOLN Place 1 drop into both eyes 4 (four) times daily.    [provider]  dicyclomine (BENTYL) 20 MG tablet Take 20 mg by mouth 3 (three) times daily. 10/26/19   [provider]  diphenoxylate-atropine (LOMOTIL) 2.5-0.025 MG tablet Take 1 tablet by mouth 4 (four) times daily as needed for diarrhea or loose stools. 05/24/20   [name], [name], MD  DULoxetine (CYMBALTA) 30 MG capsule Take 3 capsules (90 mg total) by mouth daily. 05/24/20   [name], [name], MD  estradiol (ESTRACE) 2 MG tablet Take 1 tablet (2 mg total) by mouth daily. 06/28/20   [name], [name], MD  famotidine (PEPCID) 20 MG tablet Take 20 mg by mouth at bedtime.    [provider]  hydrALAZINE (APRESOLINE) 50 MG tablet  Take 1 tablet (50 mg total) by mouth 3 (three) times daily. 06/28/20   [name], [name], MD  HYDROmorphone (DILAUDID) 4 MG tablet Take 1 tablet (4 mg total) by mouth every 6 (six) hours as needed for severe pain. 06/30/20   Florencia Reasons, MD  lipase/protease/amylase (CREON) 36000 UNITS CPEP capsule Take 1 capsule (36,000 Units total) by mouth 3 (three) times daily as needed (with meals for digestion). 06/28/20   [name], [name], MD  loperamide (IMODIUM) 2 MG capsule TAKE 1 CAPSULE BY MOUTH AS NEEDED FOR DIARRHEA OR  LOOSE  STOOLS 07/04/20   [name], [name], MD  methadone (DOLOPHINE) 5 MG tablet Take 1 tablet (5 mg total) by mouth 5 (five) times daily. 06/30/20   Florencia Reasons, MD  metoprolol tartrate (LOPRESSOR) 100 MG tablet Take 1 tablet (100 mg total) by mouth 2 (two) times daily. 06/28/20   [name], [name], MD  Multiple Vitamins-Minerals (MULTIVITAMIN ADULT PO) Take 1 tablet by mouth daily.    [provider]  Omega-3 1000 MG CAPS Take 1,000 mg by mouth daily.     [provider]  potassium chloride SA (KLOR-CON) 20 MEQ tablet Take 2 tablets (40 mEq total) by mouth daily. 06/28/20   Erline Hau, MD  PRESCRIPTION MEDICATION Inject 1 each into the vein daily. Home TPN . [facility] [place] . 1 bag for 16 hours. [phone]    [provider]  Probiotic Product (PROBIOTIC-10 PO) Take 1 capsule by mouth daily.     [provider]  promethazine (PHENERGAN) 25 MG tablet Take 1 tablet (25 mg total) by mouth every 6 (six) hours as needed for nausea or vomiting. TAKE 1 TABLET BY MOUTH EVERY 6 HOURS AS NEEDED FOR NAUSEA AND VOMITING 06/28/20   [name], [name], MD  sucralfate (CARAFATE) 1 GM/10ML suspension Take 10 mLs (1 g total) by mouth 4 (four) times daily -  with meals and at bedtime. 06/28/20   [name], [name], MD  traZODone (DESYREL) 100 MG tablet Take 2 tablets (200 mg total) by mouth at  bedtime as needed for sleep. 06/28/20   [name], [name], MD  vitamin B-12 (  CYANOCOBALAMIN) 100 MCG tablet Take 1,000 mcg by mouth daily.     [provider]    Allergies    Cefepime, Gabapentin, Hyoscyamine, Lyrica [pregabalin], Meperidine, Topamax [topiramate], Fentanyl, and Morphine and related  Review of Systems   Review of Systems  Unable to perform ROS: Mental status change    Physical Exam Updated Vital Signs BP (!) 142/71   Pulse 85   Temp 100.3 F (37.9 C) (Oral)   Resp (!) 22   SpO2 95%   Physical Exam Vitals and nursing note reviewed.  Constitutional:      General: She is not in acute distress.    Appearance: She is well-developed.     Comments: Restless, moving around in bed  HENT:     Head: Normocephalic and atraumatic.     Mouth/Throat:     Mouth: Mucous membranes are dry.  Eyes:     Conjunctiva/sclera: Conjunctivae normal.  Cardiovascular:     Rate and Rhythm: Normal rate and regular rhythm.     Heart sounds: Normal heart sounds. No murmur heard.   Pulmonary:     Effort: Pulmonary effort is normal.     Breath sounds: Normal breath sounds.  Abdominal:     General: There is no distension.     Palpations: Abdomen is soft.     Tenderness: There is no abdominal tenderness.  Musculoskeletal:        General: No swelling or tenderness.     Cervical back: Neck supple.  Skin:    General: Skin is warm and dry.     Comments: PICC in L thigh with no erythema, drainage, or tenderness  Neurological:     Mental Status: She is alert.     Comments: Fluent speech, able to follow commands, occasional confusion during conversation, moving all 4 ext equally  Psychiatric:     Comments: Anxious, pleasant and cooperative     ED Results / Procedures / Treatments   Labs (all labs ordered are listed, but only abnormal results are displayed) Labs Reviewed  COMPREHENSIVE METABOLIC PANEL - Abnormal; Notable for the following components:      Result  Value   Chloride 96 (*)    BUN 39 (*)    Creatinine, Ser 2.24 (*)    Albumin 3.3 (*)    AST 245 (*)    ALT 332 (*)    Alkaline Phosphatase 646 (*)    Total Bilirubin 2.4 (*)    GFR calc non Af Amer 23 (*)    GFR calc Af Amer 27 (*)    All other components within normal limits  CBC WITH DIFFERENTIAL/PLATELET - Abnormal; Notable for the following components:   WBC 12.2 (*)    RBC 2.84 (*)    Hemoglobin 8.4 (*)    HCT 26.3 (*)    Neutro Abs 10.5 (*)    All other components within normal limits  TSH - Abnormal; Notable for the following components:   TSH 0.297 (*)    All other components within normal limits  AMMONIA - Abnormal; Notable for the following components:   Ammonia 55 (*)    All other components within normal limits  URINE CULTURE  CULTURE, BLOOD (ROUTINE X 2)  CULTURE, BLOOD (ROUTINE X 2)  SARS CORONAVIRUS 2 BY RT PCR (HOSPITAL ORDER, [facility])  LACTIC ACID, PLASMA  URINALYSIS, ROUTINE W REFLEX MICROSCOPIC    EKG None  Sinus rhythm, rate 92, no significant change from previous tracing,  no acute ischemic changes  Radiology DG Chest Port 1 View  Result Date: 07/19/2020 CLINICAL DATA:  Sepsis, fever EXAM: PORTABLE CHEST 1 VIEW COMPARISON:  06/27/2020 FINDINGS: The heart size and mediastinal contours are stable. No focal airspace consolidation, pleural effusion, or pneumothorax. The visualized skeletal structures are unremarkable. IMPRESSION: No active disease. Electronically Signed   By: Davina Poke D.O.   On: 07/19/2020 14:40    Procedures .Critical Care Performed by: Sharlett Iles, MD Authorized by: Sharlett Iles, MD   Critical care provider statement:    Critical care time (minutes):  30   Critical care time was exclusive of:  Separately billable procedures and treating other patients   Critical care was necessary to treat or prevent imminent or life-threatening deterioration of the following conditions:   Sepsis   Critical care was time spent personally by me on the following activities:  Development of treatment plan with patient or surrogate, evaluation of patient's response to treatment, examination of patient, obtaining history from patient or surrogate, review of old charts, ordering and performing treatments and interventions, ordering and review of laboratory studies, ordering and review of radiographic studies and re-evaluation of patient's condition   (including critical care time)  Medications Ordered in ED Medications  lactated ringers infusion (has no administration in time range)  vancomycin (VANCOREADY) IVPB 1500 mg/300 mL (1,500 mg Intravenous New Bag/Given 07/19/20 1436)  lactated ringers bolus 1,000 mL (1,000 mLs Intravenous New Bag/Given 07/19/20 1422)    And  lactated ringers bolus 1,000 mL (1,000 mLs Intravenous New Bag/Given 07/19/20 1422)  metroNIDAZOLE (FLAGYL) IVPB 500 mg (500 mg Intravenous New Bag/Given 07/19/20 1438)  vancomycin (VANCOCIN) IVPB 1000 mg/200 mL premix (1,000 mg Intravenous New Bag/Given 07/19/20 1437)  acetaminophen (TYLENOL) tablet 650 mg (650 mg Oral Given 07/19/20 1422)  ceFEPIme (MAXIPIME) 2 g in sodium chloride 0.9 % 100 mL IVPB (2 g Intravenous New Bag/Given 07/19/20 1417)  HYDROmorphone (DILAUDID) injection 1 mg (1 mg Intravenous Given 07/19/20 1503)    ED Course  I have reviewed the triage vital signs and the nursing notes.  Pertinent labs & imaging results that were available during my care of the patient were reviewed by me and considered in my medical decision making (see chart for details).    MDM Rules/Calculators/A&P                          Pt alert, mildly confused but non-toxic on exam. T 102, O2 sat normal. Initiated code sepsis w/ cultures and broad antibiotics. I reviewed recent hospitalization notes from the beginning of the month, which noted that there was no obvious source of fever and pt discharged w/ course of levaquin.   Lab  work shows acute on chronic kidney injury with creatinine 2.24, AST 245, ALT 332, alk phos 646, total bilirubin 2.4, WBC 12.2, hemoglobin 8.4.  Normal lactate, TSH 0.297, ammonia 55.  chest x-ray clear.  Her LFTs have been elevated in the past but because they are uptrending, I have ordered a CT of abdomen and pelvis for reevaluation of acute intra-abdominal process.  She had a negative CT scan earlier this month in the setting of fever.  She has previously documented pancreatic duct dilatation which was evaluated with MRCP earlier this year.  Discussed admission for fever w/u with [facility], Dr. Cyndia Skeeters.  Final Clinical Impression(s) / ED Diagnoses Final diagnoses:  Fever, unspecified fever cause  AKI (acute kidney injury) ([place])  Altered mental  status, unspecified altered mental status type    Rx / DC Orders ED Discharge Orders    None       Tarae Wooden, Wenda Overland, MD 07/19/20 1619    Marelin Tat, Wenda Overland, MD 08/01/20 [phone]

## 2020-07-19 NOTE — ED Notes (Signed)
ED TO INPATIENT HANDOFF REPORT  ED Nurse Name and Phone #: [phone]  S Name/Age/Gender Caitlin Peters 59 y.o. female Room/Bed: WA23/WA23  Code Status   Code Status: Full Code  Home/SNF/Other Home Patient oriented to: self, place, time and situation Is this baseline? Yes   Triage Complete: Triage complete  Chief Complaint SIRS (systemic inflammatory response syndrome) ([place]) [R65.10]  Triage Note Patient BIBA from home, c/c AMS since earlier today. Patient AOx4 at baseline. Pt AOx2 w/ EMS. Hx of UTIs. EMS also states patient has been running a fever for several days.   T 99.9 BP 160/72 P 90 RR 20    Allergies Allergies  Allergen Reactions   Cefepime Other (See Comments)    Neurotoxicity occurring in setting of AKI. Ceftriaxone tolerated during same admit   Gabapentin Other (See Comments)    unknown   Hyoscyamine Hives and Swelling    Legs swelling   Disorientation   Lyrica [Pregabalin] Other (See Comments)    unknown   Meperidine Hives    Other reaction(s): GI Upset Due to [place]    Topamax [Topiramate] Other (See Comments)    unknown   Fentanyl Rash    Pt is allergic to fentanyl patch related to the glue (gives her a rash) Pt states she is NOT allergic to fentanyl IV medicine   Morphine And Related Rash    Level of Care/Admitting Diagnosis ED Disposition    ED Disposition Condition Comment   Admit  Hospital Area: [place] [[id number]]  Level of Care: Med-Surg [16]  May admit patient to [facility] or [facility] if equivalent level of care is available:: Yes  Covid Evaluation: Asymptomatic Screening Protocol (No Symptoms)  Diagnosis: SIRS (systemic inflammatory response syndrome) [name]) [[id number]]  Admitting Physician: Mercy Riding [[id number]]  Attending Physician: Mercy Riding [id number]  Estimated length of stay: past midnight tomorrow  Certification:: I certify this patient will need inpatient services for at least 2  midnights       B Medical/Surgery History Past Medical History:  Diagnosis Date   Acute pancreatitis 04/13/2020   Anasarca 10/2019   AVN (avascular necrosis of bone) ([name])    Cataract    Chronic pain syndrome    CKD (chronic kidney disease), stage III    Crohn disease ([place])    Crohn disease ([place])    Depression    Diverticulosis    GERD (gastroesophageal reflux disease)    HTN (hypertension)    IDA (iron deficiency anemia)    Malnutrition ([place])    Mass in chest    Osteoporosis    Pancreatitis    Short gut syndrome    Vitamin B12 deficiency    Past Surgical History:  Procedure Laterality Date   ABDOMINAL HYSTERECTOMY     ABDOMINAL SURGERY     colon resection with abdominal stoma, repair of intestinal leak, reversal of abdominal stoma   BILIARY DILATION  11/26/2019   Procedure: BILIARY DILATION;  Surgeon: Jackquline Denmark, MD;  Location: [facility] ENDOSCOPY;  Service: Endoscopy;;   BILIARY DILATION  03/08/2020   Procedure: BILIARY DILATION;  Surgeon: Irving Copas., MD;  Location: [facility] ENDOSCOPY;  Service: Gastroenterology;;   BIOPSY  03/08/2020   Procedure: BIOPSY;  Surgeon: Irving Copas., MD;  Location: [facility] ENDOSCOPY;  Service: Gastroenterology;;   CHEST WALL RESECTION     right thoracotomy,resection of chest mass with anterior rib and reconstruction using prosthetic mesh and video arthroscopy   CHOLECYSTECTOMY  COLONOSCOPY  2019   ERCP N/A 11/26/2019   Procedure: ENDOSCOPIC RETROGRADE CHOLANGIOPANCREATOGRAPHY (ERCP);  Surgeon: Jackquline Denmark, MD;  Location[facility];  Service: Endoscopy;  Laterality: N/A;   ERCP N/A 03/08/2020   Procedure: ENDOSCOPIC RETROGRADE CHOLANGIOPANCREATOGRAPHY (ERCP);  Surgeon: Irving Copas., MD;  Location: [place];  Service: Gastroenterology;  Laterality: N/A;   ESOPHAGOGASTRODUODENOSCOPY N/A 03/08/2020   Procedure: ESOPHAGOGASTRODUODENOSCOPY (EGD);  Surgeon: Irving Copas., MD;   Location: [place];  Service: Gastroenterology;  Laterality: N/A;   EUS N/A 03/08/2020   Procedure: UPPER ENDOSCOPIC ULTRASOUND (EUS) LINEAR;  Surgeon: Irving Copas., MD;  Location: [place];  Service: Gastroenterology;  Laterality: N/A;   IR FLUORO GUIDE CV LINE LEFT  01/07/2020   IR FLUORO GUIDE CV LINE LEFT  03/09/2020   IR FLUORO GUIDE CV LINE LEFT  05/09/2020   IR PTA VENOUS EXCEPT DIALYSIS CIRCUIT  01/07/2020   IR US GUIDE VASC ACCESS LEFT     x 2 06/17/19 and 09/14/2019   KNEE SURGERY     right knee    REMOVAL OF STONES  11/26/2019   Procedure: REMOVAL OF STONES;  Surgeon: Jackquline Denmark, MD;  Location: [facility]OS[facility];  Service: Endoscopy;;   REMOVAL OF STONES  03/08/2020   Procedure: REMOVAL OF STONES;  Surgeon: Irving Copas., MD;  Location: [place];  Service: Gastroenterology;;   SMALL INTESTINE SURGERY     x3   SPHINCTEROTOMY  11/26/2019   Procedure: Joan Mayans;  Surgeon: Jackquline Denmark, MD;  Location: [facility]OS[facility];  Service: Endoscopy;;   UPPER GASTROINTESTINAL ENDOSCOPY       A IV Location/Drains/Wounds Patient Lines/Drains/Airways Status    Active Line/Drains/Airways    Name Placement date Placement time Site Days   Peripheral IV 07/19/20 Anterior;Right Forearm 07/19/20  1415  Forearm  less than 1   Tunneled CVC Double Lumen (Radiology) 05/09/20 Left Femoral 40 cm 1 cm 05/09/20  0925   71   External Urinary Catheter 06/27/20  2126  --  22          Intake/Output Last 24 hours  Intake/Output Summary (Last 24 hours) at 07/19/2020 1832 Last data filed at 07/19/2020 1744 Gross per 24 hour  Intake 2700 ml  Output --  Net 2700 ml    Labs/Imaging Results for orders placed or performed during the hospital encounter of 07/19/20 (from the past 48 hour(s))  Comprehensive metabolic panel     Status: Abnormal   Collection Time: 07/19/20  1:32 PM  Result Value Ref Range   Sodium 135 135 - 145 mmol/L   Potassium 4.4 3.5 - 5.1 mmol/L    Chloride 96 (L) 98 - 111 mmol/L   CO2 24 22 - 32 mmol/L   Glucose, Bld 90 70 - 99 mg/dL    Comment: Glucose reference range applies only to samples taken after fasting for at least 8 hours.   BUN 39 (H) 6 - 20 mg/dL   Creatinine, Ser 2.24 (H) 0.44 - 1.00 mg/dL   Calcium 9.7 8.9 - 10.3 mg/dL   Total Protein 8.0 6.5 - 8.1 g/dL   Albumin 3.3 (L) 3.5 - 5.0 g/dL   AST 245 (H) 15 - 41 U/L   ALT 332 (H) 0 - 44 U/L   Alkaline Phosphatase 646 (H) 38 - 126 U/L   Total Bilirubin 2.4 (H) 0.3 - 1.2 mg/dL   GFR calc non Af Amer 23 (L) >60 mL/min   GFR calc Af Amer 27 (L) >60 mL/min   Anion  gap 15 5 - 15    Comment: Performed at [facility], [place] [address] [place], [place] [address]  CBC with Differential     Status: Abnormal   Collection Time: 07/19/20  1:32 PM  Result Value Ref Range   WBC 12.2 (H) 4.0 - 10.5 K/uL   RBC 2.84 (L) 3.87 - 5.11 MIL/uL   Hemoglobin 8.4 (L) 12.0 - 15.0 g/dL   HCT 26.3 (L) 36 - 46 %   MCV 92.6 80.0 - 100.0 fL   MCH 29.6 26.0 - 34.0 pg   MCHC 31.9 30.0 - 36.0 g/dL   RDW 14.3 11.5 - 15.5 %   Platelets 209 150 - 400 K/uL   nRBC 0.0 0.0 - 0.2 %   Neutrophils Relative % 86 %   Neutro Abs 10.5 (H) 1.7 - 7.7 K/uL   Lymphocytes Relative 7 %   Lymphs Abs 0.9 0.7 - 4.0 K/uL   Monocytes Relative 6 %   Monocytes Absolute 0.8 0 - 1 K/uL   Eosinophils Relative 0 %   Eosinophils Absolute 0.0 0 - 0 K/uL   Basophils Relative 0 %   Basophils Absolute 0.0 0 - 0 K/uL   Immature Granulocytes 1 %   Abs Immature Granulocytes 0.06 0.00 - 0.07 K/uL    Comment: Performed at [facility], [place] [address] [place], [place] [address]  Lactic acid, plasma     Status: None   Collection Time: 07/19/20  1:32 PM  Result Value Ref Range   Lactic Acid, Venous 1.1 0.5 - 1.9 mmol/L    Comment: Performed at [facility], [place] [address], [place], [place] [address]  TSH     Status: Abnormal   Collection Time: 07/19/20  1:32 PM   Result Value Ref Range   TSH 0.297 (L) 0.350 - 4.500 uIU/mL    Comment: Performed by a 3rd Generation assay with a functional sensitivity of <=0.01 uIU/mL. Performed at [facility], [place] [address] [place], [place] [address]   Ammonia     Status: Abnormal   Collection Time: 07/19/20  1:32 PM  Result Value Ref Range   Ammonia 55 (H) 9 - 35 umol/L    Comment: Performed at [facility], [place] [address], [place], [place] [address]  SARS Coronavirus 2 by RT PCR (hospital order, performed in [facility] hospital lab) Nasopharyngeal Nasopharyngeal Swab     Status: None   Collection Time: 07/19/20  1:32 PM   Specimen: Nasopharyngeal Swab  Result Value Ref Range   SARS Coronavirus 2 NEGATIVE NEGATIVE    Comment: (NOTE) SARS-CoV-2 target nucleic acids are NOT DETECTED.  The SARS-CoV-2 RNA is generally detectable in upper and lower respiratory specimens during the acute phase of infection. The lowest concentration of SARS-CoV-2 viral copies this assay can detect is 250 copies / mL. A negative result does not preclude SARS-CoV-2 infection and should not be used as the sole basis for treatment or other patient management decisions.  A negative result may occur with improper specimen collection / handling, submission of specimen other than nasopharyngeal swab, presence of viral mutation(s) within the areas targeted by this assay, and inadequate number of viral copies (<250 copies / mL). A negative result must be combined with clinical observations, patient history, and epidemiological information.  Fact Sheet for Patients:   StrictlyIdeas.no  Fact Sheet for Healthcare Providers: BankingDealers.co.za  This test is not yet approved or  cleared by the [place]  States FDA and has been authorized for detection and/or diagnosis of SARS-CoV-2 by FDA under an Emergency Use Authorization (EUA).  This EUA will  remain in effect (meaning this test can be used) for the duration of the COVID-19 declaration under Section 564(b)(1) of the Act, 21 U.S.C. section 360bbb-3(b)(1), unless the authorization is terminated or revoked sooner.  Performed at [facility], [place] [address] [place], [place] [address]    CT ABDOMEN PELVIS WO CONTRAST  Result Date: 07/19/2020 CLINICAL DATA:  Abdominal pain and fevers EXAM: CT ABDOMEN AND PELVIS WITHOUT CONTRAST TECHNIQUE: Multidetector CT imaging of the abdomen and pelvis was performed following the standard protocol without IV contrast. COMPARISON:  06/27/2020 FINDINGS: Lower chest: No acute abnormality. Hepatobiliary: Pneumobilia is again identified. Gallbladder has been surgically removed. The liver is otherwise unremarkable. Pancreas: Stable prominence of the pancreatic duct is seen. The remainder of the pancreas is within normal limits. Spleen: Normal in size without focal abnormality. Adrenals/Urinary Tract: Adrenal glands are unremarkable. Kidneys demonstrate no renal calculi or urinary tract obstructive changes. Bladder is well distended. Stomach/Bowel: No obstructive or inflammatory changes of the colon are seen. Changes of prior colon resection are noted with patent anastomosis in the mid abdomen. No small bowel or gastric abnormality is seen. Vascular/Lymphatic: Mild atherosclerotic calcifications are noted. Left femoral central venous line extending into the IVC is noted. Reproductive: Status post hysterectomy. No adnexal masses. Other: No abdominal wall hernia or abnormality. No abdominopelvic ascites. Musculoskeletal: Stable sclerotic focus adjacent to the left acetabulum is noted. Degenerative changes of the lumbar spine are seen. IMPRESSION: No significant interval change from the prior exam of 06/27/2020. Chronic changes are noted. No acute abnormality to correspond with the given clinical history is noted. Electronically Signed   By: Inez Catalina M.D.   On: 07/19/2020 17:45   DG Chest Port 1 View  Result Date: 07/19/2020 CLINICAL DATA:  Sepsis, fever EXAM: PORTABLE CHEST 1 VIEW COMPARISON:  06/27/2020 FINDINGS: The heart size and mediastinal contours are stable. No focal airspace consolidation, pleural effusion, or pneumothorax. The visualized skeletal structures are unremarkable. IMPRESSION: No active disease. Electronically Signed   By: Davina Poke D.O.   On: 07/19/2020 14:40   US Abdomen Limited RUQ  Result Date: 07/19/2020 CLINICAL DATA:  Elevated LFTs. EXAM: ULTRASOUND ABDOMEN LIMITED RIGHT UPPER QUADRANT COMPARISON:  CT 06/27/2020.  Abdominal ultrasound 05/06/2020 FINDINGS: Gallbladder: Surgically absent. Common bile duct: Diameter: 4 mm, normal. Liver: No focal lesion identified. Mildly heterogeneous in parenchymal echogenicity. Mild central intrahepatic biliary ductal dilatation. Pneumobilia on prior CT is not well demonstrated. Portal vein is patent on color Doppler imaging with normal direction of blood flow towards the liver. Other: No visualized ascites. IMPRESSION: 1. Mild central intrahepatic biliary ductal dilatation, of doubtful clinical significance. Common bile duct is normal at 4 mm. Pneumobilia on prior CT is not well demonstrated sonographically. 2. Mild heterogeneity of the hepatic parenchyma without focal lesion. 3. Cholecystectomy. Electronically Signed   By: Keith Rake M.D.   On: 07/19/2020 16:48    Pending Labs Unresulted Labs (From admission, onward)          Start     Ordered   07/26/20 0500  Creatinine, serum  (enoxaparin (LOVENOX)    CrCl >/= 30 ml/min)  Weekly,   R     Comments: while on enoxaparin therapy    07/19/20 1714   07/20/20 0500  Protime-INR  Tomorrow morning,   R  07/19/20 1714   07/20/20 0500  APTT  Tomorrow morning,   R        07/19/20 1714   07/20/20 0500  Comprehensive metabolic panel  Tomorrow morning,   R        07/19/20 1714   07/20/20 0500  CBC  Tomorrow  morning,   R        07/19/20 1714   07/20/20 0500  Ammonia  Tomorrow morning,   R        07/19/20 1830   07/20/20 0500  Magnesium  Tomorrow morning,   R        07/19/20 1830   07/20/20 0500  Phosphorus  Tomorrow morning,   R        07/19/20 1830   07/19/20 1803  Vitamin B12  Once,   STAT        07/19/20 1802   07/19/20 1803  Vitamin B1  Once,   STAT        07/19/20 1802   07/19/20 1803  Rapid urine drug screen (hospital performed)  ONCE - STAT,   STAT        07/19/20 1802   07/19/20 1726  MRSA PCR Screening  Once,   STAT        07/19/20 1725   07/19/20 1321  Urinalysis, Routine w reflex microscopic  ONCE - STAT,   STAT        07/19/20 1321   07/19/20 1321  Urine culture  ONCE - STAT,   STAT        07/19/20 1321   07/19/20 1321  Culture, blood (routine x 2)  BLOOD CULTURE X 2,   STAT      07/19/20 1321          Vitals/Pain Today's Vitals   07/19/20 1600 07/19/20 1630 07/19/20 1700 07/19/20 1748  BP: (!) 144/66 129/68 (!) 149/71   Pulse:      Resp:      Temp:      TempSrc:      SpO2:      PainSc:    8     Isolation Precautions No active isolations  Medications Medications  lactated ringers infusion ( Intravenous New Bag/Given 07/19/20 1743)  enoxaparin (LOVENOX) injection 30 mg (has no administration in time range)  metroNIDAZOLE (FLAGYL) IVPB 500 mg (has no administration in time range)  acetaminophen (TYLENOL) tablet 650 mg (has no administration in time range)    Or  acetaminophen (TYLENOL) suppository 650 mg (has no administration in time range)  oxyCODONE (Oxy IR/ROXICODONE) immediate release tablet 5 mg (5 mg Oral Given 07/19/20 1748)  ondansetron (ZOFRAN) tablet 4 mg (has no administration in time range)    Or  ondansetron (ZOFRAN) injection 4 mg (has no administration in time range)  lactated ringers bolus 1,000 mL (0 mLs Intravenous Stopped 07/19/20 1723)    And  lactated ringers bolus 1,000 mL (0 mLs Intravenous Stopped 07/19/20 1723)  metroNIDAZOLE  (FLAGYL) IVPB 500 mg (0 mg Intravenous Stopped 07/19/20 1722)  vancomycin (VANCOCIN) IVPB 1000 mg/200 mL premix (0 mg Intravenous Stopped 07/19/20 1744)  acetaminophen (TYLENOL) tablet 650 mg (650 mg Oral Given 07/19/20 1422)  ceFEPIme (MAXIPIME) 2 g in sodium chloride 0.9 % 100 mL IVPB (0 g Intravenous Stopped 07/19/20 1722)  vancomycin (VANCOREADY) IVPB 1500 mg/300 mL (0 mg Intravenous Stopped 07/19/20 1744)  HYDROmorphone (DILAUDID) injection 1 mg (1 mg Intravenous Given 07/19/20 1503)    Mobility walks Moderate fall risk  Focused Assessments .   R Recommendations: See Admitting Provider Note  Report given to:   Additional Notes: n/a

## 2020-07-19 NOTE — H&P (Signed)
History and Physical    Caitlin Peters CHY:[id number] DOB: May 29, 1961 DOA: 07/19/2020  PCP: [name], [name], MD Patient coming from: Home.  Chief Complaint: Altered mental status  HPI: Caitlin Peters is a 59 y.o. female with history of Crohn's disease, pancreatitis, short gut syndrome, TPN dependence, CKD-3B, chronic diarrhea, HTN, malnutrition, anxiety, depression, tobacco use and recent hospitalization from 8/2-8/7 with fever of unknown origin presenting with altered mental status.  Patient is awake and alert but confused. She is only oriented to self. She tells me she had a fever, generalized body pain. She also felt weak and could not get up a function right. Per patient's daughter, patient had intermittent "fever" ranging from 99-100 since she left the hospital. She has chronic nausea and diarrhea unchanged from baseline.. However, she felt confused and altered this morning that prompted her to call EMS. She denies shortness of breath, UTI symptoms, facial droop, slurring or focal weakness. She is TPN dependent although she takes some p.o. denies sick contact.   Per daughter, patient smokes about 1/4 pack a day. Denies drinking alcohol or recreational drug use.  In ED, febrile to 102. Otherwise vital signs stable. WBC 12.2 with left shift. Hgb 8.4 (9.5 on 8/17). Cr 2.24 (baseline about 1.6). BUN 40. AST/ALT 245/332 (86/265 on 8/17). ALP 646. Ammonia 55. Lactic acid 1.1. TSH 0.297. CXR and CT abdomen and pelvis without acute finding. Urinalysis pending. Blood cultures obtained. Patient was started on broad-spectrum antibiotics and IV fluid. Hospitalist service was called for admission for possible sepsis.  ROS All review of system as obtained from patient's daughter negative except for pertinent positives and negatives as history of present illness above.  PMH Past Medical History:  Diagnosis Date  . Acute pancreatitis 04/13/2020  . Anasarca 10/2019  . AVN (avascular  necrosis of bone) ([place])   . Cataract   . Chronic pain syndrome   . CKD (chronic kidney disease), stage III   . Crohn disease ([place])   . Crohn disease ([place])   . Depression   . Diverticulosis   . GERD (gastroesophageal reflux disease)   . HTN (hypertension)   . IDA (iron deficiency anemia)   . Malnutrition ([place])   . Mass in chest   . Osteoporosis   . Pancreatitis   . Short gut syndrome   . Vitamin B12 deficiency    PSH Past Surgical History:  Procedure Laterality Date  . ABDOMINAL HYSTERECTOMY    . ABDOMINAL SURGERY     colon resection with abdominal stoma, repair of intestinal leak, reversal of abdominal stoma  . BILIARY DILATION  11/26/2019   Procedure: BILIARY DILATION;  Surgeon: Jackquline Denmark, MD;  Location: [facility];  Service: Endoscopy;;  . BILIARY DILATION  03/08/2020   Procedure: BILIARY DILATION;  Surgeon: Irving Copas., MD;  Location: [place];  Service: Gastroenterology;;  . BIOPSY  03/08/2020   Procedure: BIOPSY;  Surgeon: Irving Copas., MD;  Location: [place];  Service: Gastroenterology;;  . CHEST WALL RESECTION     right thoracotomy,resection of chest mass with anterior rib and reconstruction using prosthetic mesh and video arthroscopy  . CHOLECYSTECTOMY    . COLONOSCOPY  2019  . ERCP N/A 11/26/2019   Procedure: ENDOSCOPIC RETROGRADE CHOLANGIOPANCREATOGRAPHY (ERCP);  Surgeon: Jackquline Denmark, MD;  Location: [facility];  Service: Endoscopy;  Laterality: N/A;  . ERCP N/A 03/08/2020   Procedure: ENDOSCOPIC RETROGRADE CHOLANGIOPANCREATOGRAPHY (ERCP);  Surgeon: Irving Copas., MD;  Location: [facility];  Service: Gastroenterology;  Laterality: N/A;  . ESOPHAGOGASTRODUODENOSCOPY N/A 03/08/2020   Procedure: ESOPHAGOGASTRODUODENOSCOPY (EGD);  Surgeon: Irving Copas., MD;  Location: [place];  Service: Gastroenterology;  Laterality: N/A;  . EUS N/A 03/08/2020   Procedure: UPPER ENDOSCOPIC ULTRASOUND (EUS) LINEAR;  Surgeon:  Irving Copas., MD;  Location: [facility];  Service: Gastroenterology;  Laterality: N/A;  . IR FLUORO GUIDE CV LINE LEFT  01/07/2020  . IR FLUORO GUIDE CV LINE LEFT  03/09/2020  . IR FLUORO GUIDE CV LINE LEFT  05/09/2020  . IR PTA VENOUS EXCEPT DIALYSIS CIRCUIT  01/07/2020  . IR US GUIDE VASC ACCESS LEFT     x 2 06/17/19 and 09/14/2019  . KNEE SURGERY     right knee   . REMOVAL OF STONES  11/26/2019   Procedure: REMOVAL OF STONES;  Surgeon: Jackquline Denmark, MD;  Location: [facility];  Service: Endoscopy;;  . REMOVAL OF STONES  03/08/2020   Procedure: REMOVAL OF STONES;  Surgeon: Irving Copas., MD;  Location: [facility] [place];  Service: Gastroenterology;;  . SMALL INTESTINE SURGERY     x3  . SPHINCTEROTOMY  11/26/2019   Procedure: SPHINCTEROTOMY;  Surgeon: Jackquline Denmark, MD;  Location: [facility];  Service: Endoscopy;;  . UPPER GASTROINTESTINAL ENDOSCOPY     Fam HX Family History  Problem Relation Age of Onset  . Breast cancer Sister   . Multiple sclerosis Sister   . Diabetes Sister   . Lupus Sister   . Colon cancer Other   . Crohn's disease Other   . Seizures Mother   . Glaucoma Mother   . CAD Father   . Heart disease Father   . Hypertension Father     Social Hx  reports that she has quit smoking. She has never used smokeless tobacco. She reports previous alcohol use. She reports that she does not use drugs.  Allergy Allergies  Allergen Reactions  . Cefepime Other (See Comments)    Neurotoxicity occurring in setting of AKI. Ceftriaxone tolerated during same admit  . Gabapentin Other (See Comments)    unknown  . Hyoscyamine Hives and Swelling    Legs swelling   Disorientation  . Lyrica [Pregabalin] Other (See Comments)    unknown  . Meperidine Hives    Other reaction(s): GI Upset Due to Chrones   . Topamax [Topiramate] Other (See Comments)    unknown  . Fentanyl Rash    Pt is allergic to fentanyl patch related to the glue (gives her a rash) Pt  states she is NOT allergic to fentanyl IV medicine  . Morphine And Related Rash   Home Meds Prior to Admission medications   Medication Sig Start Date End Date Taking? Authorizing Provider  acetaminophen (TYLENOL) 325 MG tablet Take 650 mg by mouth every 6 (six) hours as needed for mild pain.     [provider]  amLODipine (NORVASC) 10 MG tablet Take 1 tablet (10 mg total) by mouth daily. 06/23/20 07/23/20  [name], [name], MD  budesonide (ENTOCORT EC) 3 MG 24 hr capsule TAKE 3 CAPSULES BY MOUTH ONCE DAILY Patient taking differently: Take 3 mg by mouth daily.  04/05/20   [name], [name], MD  buPROPion [facility] SR) 100 MG 12 hr tablet Take 2 tablets by mouth once daily 07/04/20   [name], [name], MD  calcium carbonate (TUMS - DOSED IN MG ELEMENTAL CALCIUM) 500 MG chewable tablet Chew 1 tablet by mouth 3 (three) times daily as needed for indigestion or heartburn.  [provider]  cholecalciferol (VITAMIN D3) 25 MCG (1000 UT) tablet Take 1,000 Units by mouth daily.     [provider]  cycloSPORINE (RESTASIS) 0.05 % ophthalmic emulsion Place 1 drop into both eyes 2 (two) times daily.    [provider]  dexlansoprazole (DEXILANT) 60 MG capsule Take 1 capsule (60 mg total) by mouth daily. 06/28/20   [name], [name], MD  Dextran 70-Hypromellose 0.1-0.3 % SOLN Place 1 drop into both eyes 4 (four) times daily.    [provider]  dicyclomine (BENTYL) 20 MG tablet Take 20 mg by mouth 3 (three) times daily. 10/26/19   [provider]  diphenoxylate-atropine (LOMOTIL) 2.5-0.025 MG tablet Take 1 tablet by mouth 4 (four) times daily as needed for diarrhea or loose stools. 05/24/20   [name], [name], MD  DULoxetine (CYMBALTA) 30 MG capsule Take 3 capsules (90 mg total) by mouth daily. 05/24/20   [name], [name], MD  estradiol (ESTRACE) 2 MG tablet Take 1 tablet (2 mg total) by mouth daily.  06/28/20   [name], [name], MD  famotidine (PEPCID) 20 MG tablet Take 20 mg by mouth at bedtime.    [provider]  hydrALAZINE (APRESOLINE) 50 MG tablet Take 1 tablet (50 mg total) by mouth 3 (three) times daily. 06/28/20   [name], [name], MD  HYDROmorphone (DILAUDID) 4 MG tablet Take 1 tablet (4 mg total) by mouth every 6 (six) hours as needed for severe pain. 06/30/20   Florencia Reasons, MD  lipase/protease/amylase (CREON) 36000 UNITS CPEP capsule Take 1 capsule (36,000 Units total) by mouth 3 (three) times daily as needed (with meals for digestion). 06/28/20   [name], [name], MD  loperamide (IMODIUM) 2 MG capsule TAKE 1 CAPSULE BY MOUTH AS NEEDED FOR DIARRHEA OR&nbsp;&nbsp;LOOSE&nbsp;&nbsp;STOOLS 07/04/20   [name], [name], MD  methadone (DOLOPHINE) 5 MG tablet Take 1 tablet (5 mg total) by mouth 5 (five) times daily. 06/30/20   Florencia Reasons, MD  metoprolol tartrate (LOPRESSOR) 100 MG tablet Take 1 tablet (100 mg total) by mouth 2 (two) times daily. 06/28/20   [name], [name], MD  Multiple Vitamins-Minerals (MULTIVITAMIN ADULT PO) Take 1 tablet by mouth daily.    [provider]  Omega-3 1000 MG CAPS Take 1,000 mg by mouth daily.     [provider]  potassium chloride SA (KLOR-CON) 20 MEQ tablet Take 2 tablets (40 mEq total) by mouth daily. 06/28/20   Erline Hau, MD  PRESCRIPTION MEDICATION Inject 1 each into the vein daily. Home TPN . [facility] [place] . 1 bag for 16 hours. [phone]    [provider]  Probiotic Product (PROBIOTIC-10 PO) Take 1 capsule by mouth daily.     [provider]  promethazine (PHENERGAN) 25 MG tablet Take 1 tablet (25 mg total) by mouth every 6 (six) hours as needed for nausea or vomiting. TAKE 1 TABLET BY MOUTH EVERY 6 HOURS AS NEEDED FOR NAUSEA AND VOMITING 06/28/20   [name], [name], MD  sucralfate (CARAFATE) 1 GM/10ML suspension Take 10 mLs  (1 g total) by mouth 4 (four) times daily -  with meals and at bedtime. 06/28/20   [name], [name], MD  traZODone (DESYREL) 100 MG tablet Take 2 tablets (200 mg total) by mouth at bedtime as needed for sleep. 06/28/20   [name], [name], MD  vitamin B-12 (CYANOCOBALAMIN) 100 MCG tablet  Take 1,000 mcg by mouth daily.     [provider]    Physical Exam: Vitals:   07/19/20 1530 07/19/20 1600 07/19/20 1630 07/19/20 1700  BP: (!) 142/71 (!) 144/66 129/68 (!) 149/71  Pulse: 85     Resp: (!) 22     Temp:      TempSrc:      SpO2: 95%       GENERAL: No acute distress.  Appears well.  HEENT: Chapped lips. Vision and hearing grossly intact.  NECK: Supple.  No apparent JVD.  RESP: On RA. No IWOB. Good air movement bilaterally. CVS:  RRR. Heart sounds normal.  ABD/GI/GU: Bowel sounds present. Soft. Non tender.  MSK/EXT:  Moves extremities. No apparent deformity or edema. PICC line over left thigh. SKIN: no apparent skin lesion or wound.  NEURO: Awake, alert but only oriented to self. Follows commands. CN, motor, light sensation, patellar reflex, rapid alternating movements intact. PSYCH: Calm. Normal affect.   Personally Reviewed Radiological Exams CT ABDOMEN PELVIS WO CONTRAST  Result Date: 07/19/2020 CLINICAL DATA:  Abdominal pain and fevers EXAM: CT ABDOMEN AND PELVIS WITHOUT CONTRAST TECHNIQUE: Multidetector CT imaging of the abdomen and pelvis was performed following the standard protocol without IV contrast. COMPARISON:  06/27/2020 FINDINGS: Lower chest: No acute abnormality. Hepatobiliary: Pneumobilia is again identified. Gallbladder has been surgically removed. The liver is otherwise unremarkable. Pancreas: Stable prominence of the pancreatic duct is seen. The remainder of the pancreas is within normal limits. Spleen: Normal in size without focal abnormality. Adrenals/Urinary Tract: Adrenal glands are unremarkable. Kidneys demonstrate no renal calculi or urinary  tract obstructive changes. Bladder is well distended. Stomach/Bowel: No obstructive or inflammatory changes of the colon are seen. Changes of prior colon resection are noted with patent anastomosis in the mid abdomen. No small bowel or gastric abnormality is seen. Vascular/Lymphatic: Mild atherosclerotic calcifications are noted. Left femoral central venous line extending into the IVC is noted. Reproductive: Status post hysterectomy. No adnexal masses. Other: No abdominal wall hernia or abnormality. No abdominopelvic ascites. Musculoskeletal: Stable sclerotic focus adjacent to the left acetabulum is noted. Degenerative changes of the lumbar spine are seen. IMPRESSION: No significant interval change from the prior exam of 06/27/2020. Chronic changes are noted. No acute abnormality to correspond with the given clinical history is noted. Electronically Signed   By: Inez Catalina M.D.   On: 07/19/2020 17:45   DG Chest Port 1 View  Result Date: 07/19/2020 CLINICAL DATA:  Sepsis, fever EXAM: PORTABLE CHEST 1 VIEW COMPARISON:  06/27/2020 FINDINGS: The heart size and mediastinal contours are stable. No focal airspace consolidation, pleural effusion, or pneumothorax. The visualized skeletal structures are unremarkable. IMPRESSION: No active disease. Electronically Signed   By: Davina Poke D.O.   On: 07/19/2020 14:40   US Abdomen Limited RUQ  Result Date: 07/19/2020 CLINICAL DATA:  Elevated LFTs. EXAM: ULTRASOUND ABDOMEN LIMITED RIGHT UPPER QUADRANT COMPARISON:  CT 06/27/2020.  Abdominal ultrasound 05/06/2020 FINDINGS: Gallbladder: Surgically absent. Common bile duct: Diameter: 4 mm, normal. Liver: No focal lesion identified. Mildly heterogeneous in parenchymal echogenicity. Mild central intrahepatic biliary ductal dilatation. Pneumobilia on prior CT is not well demonstrated. Portal vein is patent on color Doppler imaging with normal direction of blood flow towards the liver. Other: No visualized ascites.  IMPRESSION: 1. Mild central intrahepatic biliary ductal dilatation, of doubtful clinical significance. Common bile duct is normal at 4 mm. Pneumobilia on prior CT is not well demonstrated sonographically. 2. Mild heterogeneity of the hepatic parenchyma without focal  lesion. 3. Cholecystectomy. Electronically Signed   By: Keith Rake M.D.   On: 07/19/2020 16:48     Personally Reviewed Labs: CBC: Recent Labs  Lab 07/19/20 1332  WBC 12.2*  NEUTROABS 10.5*  HGB 8.4*  HCT 26.3*  MCV 92.6  PLT 660   Basic Metabolic Panel: Recent Labs  Lab 07/19/20 1332  NA 135  K 4.4  CL 96*  CO2 24  GLUCOSE 90  BUN 39*  CREATININE 2.24*  CALCIUM 9.7   GFR: Estimated Creatinine Clearance: 27.3 mL/min (A) (by C-G formula based on SCr of 2.24 mg/dL (H)). Liver Function Tests: Recent Labs  Lab 07/19/20 1332  AST 245*  ALT 332*  ALKPHOS 646*  BILITOT 2.4*  PROT 8.0  ALBUMIN 3.3*   No results for input(s): LIPASE, AMYLASE in the last 168 hours. Recent Labs  Lab 07/19/20 1332  AMMONIA 55*   Coagulation Profile: No results for input(s): INR, PROTIME in the last 168 hours. Cardiac Enzymes: No results for input(s): CKTOTAL, CKMB, CKMBINDEX, TROPONINI in the last 168 hours. BNP (last 3 results) No results for input(s): PROBNP in the last 8760 hours. HbA1C: No results for input(s): HGBA1C in the last 72 hours. CBG: No results for input(s): GLUCAP in the last 168 hours. Lipid Profile: No results for input(s): CHOL, HDL, LDLCALC, TRIG, CHOLHDL, LDLDIRECT in the last 72 hours. Thyroid Function Tests: Recent Labs    07/19/20 1332  TSH 0.297*   Anemia Panel: No results for input(s): VITAMINB12, FOLATE, FERRITIN, TIBC, IRON, RETICCTPCT in the last 72 hours. Urine analysis:    Component Value Date/Time   COLORURINE YELLOW 07/12/2020 1121   APPEARANCEUR CLEAR 07/12/2020 1121   LABSPEC 1.010 07/12/2020 1121   PHURINE 7.0 07/12/2020 1121   GLUCOSEU NEGATIVE 07/12/2020 1121    HGBUR NEGATIVE 07/12/2020 1121   BILIRUBINUR NEGATIVE 07/12/2020 1121   KETONESUR NEGATIVE 07/12/2020 1121   PROTEINUR NEGATIVE 07/12/2020 1121   NITRITE NEGATIVE 07/12/2020 1121   LEUKOCYTESUR NEGATIVE 07/12/2020 1121    Sepsis Labs:  Lactic acid 1.1.  Personally Reviewed EKG:  Twelve-lead EKG normal sinus rhythm  Assessment/Plan Active Problems:   SIRS (systemic inflammatory response syndrome) ([name]) Patient with fever, leukocytosis, encephalopathy, AKI and transaminitis concerning for severe sepsis. Lactic acid normal. Hemodynamically stable. No clear source of infection at this point but significant risk factor given PICC line and TPN. CXR and CT abdomen and pelvis without acute finding. Of note, patient had similar hospitalization for fever of unknown origin about 2 weeks ago. At that time she was discharged on Levaquin every other day. -Continue broad-spectrum antibiotics-cefepime, Flagyl and vancomycin pending cultures. -Continue gentle IV fluid hydration  Acute metabolic encephalopathy-could be due to sepsis, dehydration or hepatic encephalopathy. Ammonia 55 which could be due to dehydration or liver failure. She already have chronic diarrhea. She also have low TSH but difficult to interpret in the setting of acute illness. No focal neuro symptoms to suggest CVA. -IV fluid hydration and recheck ammonia -Reorientation and delirium precautions  Elevated liver enzymes/alkaline phosphatase/hyperbilirubinemia-seems to be uptrending compared to her labs on 8/17. Due to TPN? CT abdomen and pelvis without acute finding. -Check right upper quadrant ultrasound -Hold TPN.  AKI on CKD-3B/azotemia: Likely prerenal. Does not appear to be on nephrotoxic meds. -IV fluid -Recheck in the morning -Renal ultrasound if worse  Normocytic anemia-Hgb 8.4 (baseline 8-9). Likely anemia of chronic disease -Continue monitoring  History of Crohn's disease/short gut syndrome/TPN dependence -Hold TPN  in the setting of transaminitis -Continue  home medications after med rec  Essential hypertension: Normotensive -Resume home medications after med rec  Anxiety/depression: Stable. -Continue home medications  Chronic pain syndrome -Resume home medications after med rec  Leukocytosis/bandemia-could be due to #1 or dehydration. -Manage as above  DVT prophylaxis: Reduced dose Lovenox  Code Status: Full code-confirmed with patient's daughter Caitlin Peters Family Communication: Updated patient's daughter  Disposition Plan: Admit to MedSurg Consults called: None Admission status: Inpatient   Mercy Riding MD [facility]  If 7PM-7AM, please contact night-coverage [email or web address]  07/19/2020, 5:59 PM

## 2020-07-19 NOTE — ED Notes (Signed)
Transport called to take patient upstairs 

## 2020-07-19 NOTE — Progress Notes (Addendum)
Pharmacy Antibiotic Note  Caitlin Peters is a 59 y.o. female admitted on 07/19/2020 with sepsis.  Pharmacy has been consulted for Cefepime and Vancomycin dosing.  One time antibiotic doses given in ED. - Cefepime 2g IV x1 - Vancomycin 1000 mg IV x1 at 14:37 - Vancomycin 1500 mg IV x1 at 14:36  Plan: Metronidazole per MD Cefepime 2g IV q24h Vancomycin pending renal function Measure Vanc trough at steady state as needed.  Goal VT 15-20 Follow up renal function, culture results, and clinical course.     Temp (24hrs), Avg:101.2 F (38.4 C), Min:100.3 F (37.9 C), Max:102 F (38.9 C)  Recent Labs  Lab 07/19/20 1332  WBC 12.2*  CREATININE 2.24*  LATICACIDVEN 1.1    Estimated Creatinine Clearance: 27.3 mL/min (A) (by C-G formula based on SCr of 2.24 mg/dL (H)).    Allergies  Allergen Reactions  . Cefepime Other (See Comments)    Neurotoxicity occurring in setting of AKI. Ceftriaxone tolerated during same admit  . Gabapentin Other (See Comments)    unknown  . Hyoscyamine Hives and Swelling    Legs swelling   Disorientation  . Lyrica [Pregabalin] Other (See Comments)    unknown  . Meperidine Hives    Other reaction(s): GI Upset Due to Chrones   . Topamax [Topiramate] Other (See Comments)    unknown  . Fentanyl Rash    Pt is allergic to fentanyl patch related to the glue (gives her a rash) Pt states she is NOT allergic to fentanyl IV medicine  . Morphine And Related Rash    Antimicrobials this admission: 8/24 Cefepime >> 8/24 Vancomycin >>  8/24 metronidazole >>   Dose adjustments this admission:   Microbiology results: 8/24 COVID: neg 8/24 BCx: 8/24 UCx: 8/24 MRSA PCR:   Thank you for allowing pharmacy to be a part of this patient's care.  Gretta Arab PharmD, BCPS Clinical Pharmacist [facility] main pharmacy [phone] 07/19/2020 5:22 PM

## 2020-07-19 NOTE — ED Triage Notes (Signed)
Patient BIBA from home, c/c AMS since earlier today. Patient AOx4 at baseline. Pt AOx2 w/ EMS. Hx of UTIs. EMS also states patient has been running a fever for several days.   T 99.9 BP 160/72 P 90 RR 20

## 2020-07-19 NOTE — Progress Notes (Signed)
A consult was received from an ED physician for vancomycin and cefepime per pharmacy dosing.  [name] profile has been reviewed for ht/wt/allergies/indication/available labs.   A one time order has been placed for vancomycin 1500 mg and cefepime 2g.  Further antibiotics/pharmacy consults should be ordered by admitting physician if indicated.                       Thank you, Napoleon Form 07/19/2020  1:45 PM

## 2020-07-20 ENCOUNTER — Inpatient Hospital Stay (HOSPITAL_COMMUNITY): Payer: Medicare Other

## 2020-07-20 DIAGNOSIS — R7881 Bacteremia: Secondary | ICD-10-CM

## 2020-07-20 HISTORY — PX: IR FLUORO GUIDE CV LINE LEFT: IMG2282

## 2020-07-20 LAB — COMPREHENSIVE METABOLIC PANEL
ALT: 213 U/L — ABNORMAL HIGH (ref 0–44)
AST: 118 U/L — ABNORMAL HIGH (ref 15–41)
Albumin: 2.5 g/dL — ABNORMAL LOW (ref 3.5–5.0)
Alkaline Phosphatase: 446 U/L — ABNORMAL HIGH (ref 38–126)
Anion gap: 9 (ref 5–15)
BUN: 28 mg/dL — ABNORMAL HIGH (ref 6–20)
CO2: 23 mmol/L (ref 22–32)
Calcium: 9.1 mg/dL (ref 8.9–10.3)
Chloride: 104 mmol/L (ref 98–111)
Creatinine, Ser: 1.78 mg/dL — ABNORMAL HIGH (ref 0.44–1.00)
GFR calc Af Amer: 36 mL/min — ABNORMAL LOW (ref >60)
GFR calc non Af Amer: 31 mL/min — ABNORMAL LOW (ref >60)
Glucose, Bld: 90 mg/dL (ref 70–99)
Potassium: 4.1 mmol/L (ref 3.5–5.1)
Sodium: 136 mmol/L (ref 135–145)
Total Bilirubin: 1.7 mg/dL — ABNORMAL HIGH (ref 0.3–1.2)
Total Protein: 6.2 g/dL — ABNORMAL LOW (ref 6.5–8.1)

## 2020-07-20 LAB — BLOOD CULTURE ID PANEL (REFLEXED) - BCID2
A.calcoaceticus-baumannii: NOT DETECTED
Bacteroides fragilis: NOT DETECTED
CTX-M ESBL: NOT DETECTED
Candida albicans: NOT DETECTED
Candida auris: NOT DETECTED
Candida glabrata: NOT DETECTED
Candida krusei: NOT DETECTED
Candida parapsilosis: NOT DETECTED
Candida tropicalis: NOT DETECTED
Carbapenem resist OXA 48 LIKE: NOT DETECTED
Carbapenem resistance IMP: NOT DETECTED
Carbapenem resistance KPC: NOT DETECTED
Carbapenem resistance NDM: NOT DETECTED
Carbapenem resistance VIM: NOT DETECTED
Cryptococcus neoformans/gattii: NOT DETECTED
Enterobacter cloacae complex: DETECTED — AB
Enterobacterales: DETECTED — AB
Enterococcus Faecium: NOT DETECTED
Enterococcus faecalis: NOT DETECTED
Escherichia coli: NOT DETECTED
Haemophilus influenzae: NOT DETECTED
Klebsiella aerogenes: NOT DETECTED
Klebsiella oxytoca: NOT DETECTED
Klebsiella pneumoniae: NOT DETECTED
Listeria monocytogenes: NOT DETECTED
Neisseria meningitidis: NOT DETECTED
Proteus species: NOT DETECTED
Pseudomonas aeruginosa: NOT DETECTED
Salmonella species: NOT DETECTED
Serratia marcescens: NOT DETECTED
Staphylococcus aureus (BCID): NOT DETECTED
Staphylococcus epidermidis: NOT DETECTED
Staphylococcus lugdunensis: NOT DETECTED
Staphylococcus species: NOT DETECTED
Stenotrophomonas maltophilia: DETECTED — AB
Streptococcus agalactiae: NOT DETECTED
Streptococcus pneumoniae: NOT DETECTED
Streptococcus pyogenes: NOT DETECTED
Streptococcus species: NOT DETECTED

## 2020-07-20 LAB — URINALYSIS, ROUTINE W REFLEX MICROSCOPIC
Bacteria, UA: NONE SEEN
Bilirubin Urine: NEGATIVE
Glucose, UA: NEGATIVE mg/dL
Hgb urine dipstick: NEGATIVE
Ketones, ur: NEGATIVE mg/dL
Leukocytes,Ua: NEGATIVE
Nitrite: NEGATIVE
Protein, ur: 30 mg/dL — AB
Specific Gravity, Urine: 1.012 (ref 1.005–1.030)
pH: 8 (ref 5.0–8.0)

## 2020-07-20 LAB — CBC
HCT: 22.4 % — ABNORMAL LOW (ref 36.0–46.0)
Hemoglobin: 7.3 g/dL — ABNORMAL LOW (ref 12.0–15.0)
MCH: 30.5 pg (ref 26.0–34.0)
MCHC: 32.6 g/dL (ref 30.0–36.0)
MCV: 93.7 fL (ref 80.0–100.0)
Platelets: 164 10*3/uL (ref 150–400)
RBC: 2.39 MIL/uL — ABNORMAL LOW (ref 3.87–5.11)
RDW: 14.1 % (ref 11.5–15.5)
WBC: 8.6 10*3/uL (ref 4.0–10.5)
nRBC: 0 % (ref 0.0–0.2)

## 2020-07-20 LAB — MAGNESIUM: Magnesium: 2.2 mg/dL (ref 1.7–2.4)

## 2020-07-20 LAB — RAPID URINE DRUG SCREEN, HOSP PERFORMED
Amphetamines: NOT DETECTED
Barbiturates: NOT DETECTED
Benzodiazepines: NOT DETECTED
Cocaine: NOT DETECTED
Opiates: POSITIVE — AB
Tetrahydrocannabinol: NOT DETECTED

## 2020-07-20 LAB — PROTIME-INR
INR: 1.1 (ref 0.8–1.2)
Prothrombin Time: 13.6 seconds (ref 11.4–15.2)

## 2020-07-20 LAB — PHOSPHORUS: Phosphorus: 3 mg/dL (ref 2.5–4.6)

## 2020-07-20 LAB — APTT: aPTT: 40 seconds — ABNORMAL HIGH (ref 24–36)

## 2020-07-20 LAB — VITAMIN B12: Vitamin B-12: 3028 pg/mL — ABNORMAL HIGH (ref 180–914)

## 2020-07-20 LAB — AMMONIA: Ammonia: 29 umol/L (ref 9–35)

## 2020-07-20 MED ORDER — MIDAZOLAM HCL 2 MG/2ML IJ SOLN
INTRAMUSCULAR | Status: AC | PRN
  Administered 2020-07-20 (×2): 1 mg via INTRAVENOUS

## 2020-07-20 MED ORDER — LIDOCAINE HCL 1 % IJ SOLN
INTRAMUSCULAR | Status: AC
  Filled 2020-07-20: qty 20

## 2020-07-20 MED ORDER — MIDAZOLAM HCL 2 MG/2ML IJ SOLN
INTRAMUSCULAR | Status: AC
  Filled 2020-07-20: qty 2

## 2020-07-20 MED ORDER — LEVOFLOXACIN IN D5W 750 MG/150ML IV SOLN
750.0000 mg | INTRAVENOUS | Status: DC
  Filled 2020-07-20 (×2): qty 150

## 2020-07-20 MED ORDER — LACTATED RINGERS IV SOLN: INTRAVENOUS | Status: DC

## 2020-07-20 MED ORDER — LIDOCAINE HCL (PF) 1 % IJ SOLN
INTRAMUSCULAR | Status: AC | PRN
  Administered 2020-07-20: 5 mL

## 2020-07-20 MED ORDER — POLYVINYL ALCOHOL 1.4 % OP SOLN
1.0000 [drp] | Freq: Four times a day (QID) | OPHTHALMIC | Status: DC
  Administered 2020-07-20 – 2020-07-23 (×10): 1 [drp] via OPHTHALMIC
  Filled 2020-07-20: qty 15

## 2020-07-20 MED ORDER — FENTANYL CITRATE (PF) 100 MCG/2ML IJ SOLN
INTRAMUSCULAR | Status: AC
  Filled 2020-07-20: qty 2

## 2020-07-20 MED ORDER — FENTANYL CITRATE (PF) 100 MCG/2ML IJ SOLN
INTRAMUSCULAR | Status: AC | PRN
  Administered 2020-07-20 (×2): 50 ug via INTRAVENOUS

## 2020-07-20 MED ORDER — HYDROMORPHONE HCL 1 MG/ML IJ SOLN
0.2500 mg | Freq: Once | INTRAMUSCULAR | Status: AC
  Administered 2020-07-20: 0.25 mg via INTRAVENOUS
  Filled 2020-07-20: qty 0.5

## 2020-07-20 MED ORDER — SODIUM CHLORIDE 0.9% FLUSH: 10.0000 mL | INTRAVENOUS | Status: DC | PRN

## 2020-07-20 MED ORDER — HYDROMORPHONE HCL 1 MG/ML IJ SOLN
0.2500 mg | INTRAMUSCULAR | Status: AC | PRN
  Administered 2020-07-20 – 2020-07-22 (×10): 0.25 mg via INTRAVENOUS
  Filled 2020-07-20 (×10): qty 0.5

## 2020-07-20 MED ORDER — SODIUM CHLORIDE 0.9 % IV SOLN
2.0000 g | Freq: Two times a day (BID) | INTRAVENOUS | Status: DC
  Filled 2020-07-20: qty 2

## 2020-07-20 MED ORDER — SULFAMETHOXAZOLE-TRIMETHOPRIM 400-80 MG/5ML IV SOLN
320.0000 mg | Freq: Two times a day (BID) | INTRAVENOUS | Status: DC
  Administered 2020-07-20 – 2020-07-23 (×7): 320 mg via INTRAVENOUS
  Filled 2020-07-20 (×8): qty 20

## 2020-07-20 MED ORDER — CHLORHEXIDINE GLUCONATE CLOTH 2 % EX PADS
6.0000 | MEDICATED_PAD | Freq: Every day | CUTANEOUS | Status: DC
  Administered 2020-07-20 – 2020-07-23 (×4): 6 via TOPICAL

## 2020-07-20 NOTE — Progress Notes (Signed)
Referring Physician(s): Caitlin Peters,R  Supervising Physician: Caitlin Peters  Patient Status:  [facility] - In-pt  Chief Complaint:  Bacteremia/poor venous access  Subjective: Patient familiar to our service from prior venous access procedures, latest on 05/09/2020 which was exchange of tunneled left femoral central venous catheter.  She has a history of Crohn's disease with short gut syndrome and is chronically dependent upon TPN.  She has had numerous long-term central venous access procedures dating back to 2003 and reportedly occluded in her arms and neck regions bilaterally.  She currently has a chronic indwelling left femoral tunneled PICC which gets exchanged whenever she develops bacteremia.  Gram-negative rods are noted in the latest blood cultures.  Request now received from ID service for tunneled PICC exchange.  She is currently afebrile, denies any acute respiratory problems, nausea, vomiting or bleeding.  She does have chronic abdominal discomfort.  Past Medical History:  Diagnosis Date  . Acute pancreatitis 04/13/2020  . Anasarca 10/2019  . AVN (avascular necrosis of bone) ([place])   . Cataract   . Chronic pain syndrome   . CKD (chronic kidney disease), stage III   . Crohn disease ([place])   . Crohn disease ([place])   . Depression   . Diverticulosis   . GERD (gastroesophageal reflux disease)   . HTN (hypertension)   . IDA (iron deficiency anemia)   . Malnutrition ([place])   . Mass in chest   . Osteoporosis   . Pancreatitis   . Short gut syndrome   . Vitamin B12 deficiency    Past Surgical History:  Procedure Laterality Date  . ABDOMINAL HYSTERECTOMY    . ABDOMINAL SURGERY     colon resection with abdominal stoma, repair of intestinal leak, reversal of abdominal stoma  . BILIARY DILATION  11/26/2019   Procedure: BILIARY DILATION;  Surgeon: Jackquline Denmark, MD;  Location: [facility];  Service: Endoscopy;;  . BILIARY DILATION  03/08/2020   Procedure: BILIARY DILATION;  Surgeon:  Irving Copas., MD;  Location: [place];  Service: Gastroenterology;;  . BIOPSY  03/08/2020   Procedure: BIOPSY;  Surgeon: Irving Copas., MD;  Location: [place];  Service: Gastroenterology;;  . CHEST WALL RESECTION     right thoracotomy,resection of chest mass with anterior rib and reconstruction using prosthetic mesh and video arthroscopy  . CHOLECYSTECTOMY    . COLONOSCOPY  2019  . ERCP N/A 11/26/2019   Procedure: ENDOSCOPIC RETROGRADE CHOLANGIOPANCREATOGRAPHY (ERCP);  Surgeon: Jackquline Denmark, MD;  Location: [facility];  Service: Endoscopy;  Laterality: N/A;  . ERCP N/A 03/08/2020   Procedure: ENDOSCOPIC RETROGRADE CHOLANGIOPANCREATOGRAPHY (ERCP);  Surgeon: Irving Copas., MD;  Location: [place];  Service: Gastroenterology;  Laterality: N/A;  . ESOPHAGOGASTRODUODENOSCOPY N/A 03/08/2020   Procedure: ESOPHAGOGASTRODUODENOSCOPY (EGD);  Surgeon: Irving Copas., MD;  Location: [place];  Service: Gastroenterology;  Laterality: N/A;  . EUS N/A 03/08/2020   Procedure: UPPER ENDOSCOPIC ULTRASOUND (EUS) LINEAR;  Surgeon: Irving Copas., MD;  Location: [place];  Service: Gastroenterology;  Laterality: N/A;  . IR FLUORO GUIDE CV LINE LEFT  01/07/2020  . IR FLUORO GUIDE CV LINE LEFT  03/09/2020  . IR FLUORO GUIDE CV LINE LEFT  05/09/2020  . IR PTA VENOUS EXCEPT DIALYSIS CIRCUIT  01/07/2020  . IR US GUIDE VASC ACCESS LEFT     x 2 06/17/19 and 09/14/2019  . KNEE SURGERY     right knee   . REMOVAL OF STONES  11/26/2019   Procedure: REMOVAL OF STONES;  Surgeon: Lyndel Safe,  Peyton Bottoms, MD;  Location: [facility];  Service: Endoscopy;;  . REMOVAL OF STONES  03/08/2020   Procedure: REMOVAL OF STONES;  Surgeon: Irving Copas., MD;  Location: [place];  Service: Gastroenterology;;  . SMALL INTESTINE SURGERY     x3  . SPHINCTEROTOMY  11/26/2019   Procedure: SPHINCTEROTOMY;  Surgeon: Jackquline Denmark, MD;  Location: [facility];  Service:  Endoscopy;;  . UPPER GASTROINTESTINAL ENDOSCOPY        Allergies: Cefepime, Gabapentin, Hyoscyamine, Lyrica [pregabalin], Meperidine, Topamax [topiramate], Fentanyl, and Morphine and related  Medications: Prior to Admission medications   Medication Sig Start Date End Date Taking? Authorizing Provider  acetaminophen (TYLENOL) 325 MG tablet Take 650 mg by mouth every 6 (six) hours as needed for mild pain.    Yes [provider]  amLODipine (NORVASC) 10 MG tablet Take 1 tablet (10 mg total) by mouth daily. 06/23/20 07/23/20 Yes Erline Hau, MD  budesonide (ENTOCORT EC) 3 MG 24 hr capsule TAKE 3 CAPSULES BY MOUTH ONCE DAILY Patient taking differently: Take 3 mg by mouth daily.  04/05/20  Yes [name], [name], MD  buPROPion [facility] SR) 100 MG 12 hr tablet Take 2 tablets by mouth once daily Patient taking differently: Take 200 mg by mouth daily.  07/04/20  Yes [name], [name], MD  calcium carbonate (TUMS - DOSED IN MG ELEMENTAL CALCIUM) 500 MG chewable tablet Chew 1 tablet by mouth 3 (three) times daily as needed for indigestion or heartburn.   Yes [provider]  cholecalciferol (VITAMIN D3) 25 MCG (1000 UT) tablet Take 1,000 Units by mouth daily.    Yes [provider]  cycloSPORINE (RESTASIS) 0.05 % ophthalmic emulsion Place 1 drop into both eyes 2 (two) times daily.   Yes [provider]  dexlansoprazole (DEXILANT) 60 MG capsule Take 1 capsule (60 mg total) by mouth daily. 06/28/20  Yes [name], [name], MD  Dextran 70-Hypromellose 0.1-0.3 % SOLN Place 1 drop into both eyes 4 (four) times daily.   Yes [provider]  dicyclomine (BENTYL) 20 MG tablet Take 20 mg by mouth 3 (three) times daily. 10/26/19  Yes [provider]  diphenoxylate-atropine (LOMOTIL) 2.5-0.025 MG tablet Take 1 tablet by mouth 4 (four) times daily as needed for diarrhea or loose stools. 05/24/20  Yes [name], [name],  MD  DULoxetine (CYMBALTA) 30 MG capsule Take 3 capsules (90 mg total) by mouth daily. 05/24/20  Yes [name], [name], MD  estradiol (ESTRACE) 2 MG tablet Take 1 tablet (2 mg total) by mouth daily. 06/28/20  Yes [name], [name], MD  famotidine (PEPCID) 20 MG tablet Take 20 mg by mouth at bedtime.   Yes [provider]  hydrALAZINE (APRESOLINE) 50 MG tablet Take 1 tablet (50 mg total) by mouth 3 (three) times daily. 06/28/20  Yes [name], [name], MD  HYDROmorphone (DILAUDID) 4 MG tablet Take 1 tablet (4 mg total) by mouth every 6 (six) hours as needed for severe pain. 06/30/20  Yes Florencia Reasons, MD  lipase/protease/amylase (CREON) 36000 UNITS CPEP capsule Take 1 capsule (36,000 Units total) by mouth 3 (three) times daily as needed (with meals for digestion). 06/28/20  Yes [name], [name], MD  loperamide (IMODIUM) 2 MG capsule TAKE 1 CAPSULE BY MOUTH AS NEEDED FOR DIARRHEA OR&nbsp;&nbsp;LOOSE&nbsp;&nbsp;STOOLS Patient taking differently: Take 2 mg by mouth as needed for diarrhea or loose stools.  07/04/20  Yes [name], [name], MD  methadone (  DOLOPHINE) 5 MG tablet Take 1 tablet (5 mg total) by mouth 5 (five) times daily. 06/30/20  Yes Florencia Reasons, MD  metoprolol tartrate (LOPRESSOR) 100 MG tablet Take 1 tablet (100 mg total) by mouth 2 (two) times daily. 06/28/20  Yes [name], [name], MD  Multiple Vitamins-Minerals (MULTIVITAMIN ADULT PO) Take 1 tablet by mouth daily.   Yes [provider]  Omega-3 1000 MG CAPS Take 1,000 mg by mouth daily.    Yes [provider]  potassium chloride SA (KLOR-CON) 20 MEQ tablet Take 2 tablets (40 mEq total) by mouth daily. 06/28/20  Yes Erline Hau, MD  PRESCRIPTION MEDICATION Inject 1 each into the vein daily. Home TPN . [facility] . 1 bag for 16 hours. [phone]   Yes [provider]  Probiotic Product (PROBIOTIC-10 PO) Take 1 capsule by mouth  daily.    Yes [provider]  promethazine (PHENERGAN) 25 MG tablet Take 1 tablet (25 mg total) by mouth every 6 (six) hours as needed for nausea or vomiting. TAKE 1 TABLET BY MOUTH EVERY 6 HOURS AS NEEDED FOR NAUSEA AND VOMITING Patient taking differently: Take 25 mg by mouth every 6 (six) hours as needed for nausea or vomiting.  06/28/20  Yes [name], [name], MD  sucralfate (CARAFATE) 1 GM/10ML suspension Take 10 mLs (1 g total) by mouth 4 (four) times daily -  with meals and at bedtime. 06/28/20  Yes [name], [name], MD  traZODone (DESYREL) 100 MG tablet Take 2 tablets (200 mg total) by mouth at bedtime as needed for sleep. 06/28/20  Yes [name], [name], MD  vitamin B-12 (CYANOCOBALAMIN) 100 MCG tablet Take 1,000 mcg by mouth daily.    Yes [provider]     Vital Signs: BP (!) 159/69   Pulse 66   Temp 98.8 F (37.1 C) (Oral)   Resp 20   Ht 5' 8"  (1.727 m)   Wt 144 lb 4.8 oz (65.5 kg)   SpO2 99%   BMI 21.94 kg/m   Physical Exam awake, answering questions okay, chest clear to auscultation bilaterally.  Heart with normal S1/S2 ,occasional ectopy/? bigeminy noted.  Abdomen soft, positive bowel sounds, some mild diffuse tenderness to palpation.  No lower extremity edema.  Intact, left femoral central venous catheter  Imaging: CT ABDOMEN PELVIS WO CONTRAST  Result Date: 07/19/2020 CLINICAL DATA:  Abdominal pain and fevers EXAM: CT ABDOMEN AND PELVIS WITHOUT CONTRAST TECHNIQUE: Multidetector CT imaging of the abdomen and pelvis was performed following the standard protocol without IV contrast. COMPARISON:  06/27/2020 FINDINGS: Lower chest: No acute abnormality. Hepatobiliary: Pneumobilia is again identified. Gallbladder has been surgically removed. The liver is otherwise unremarkable. Pancreas: Stable prominence of the pancreatic duct is seen. The remainder of the pancreas is within normal limits. Spleen: Normal in size without focal abnormality.  Adrenals/Urinary Tract: Adrenal glands are unremarkable. Kidneys demonstrate no renal calculi or urinary tract obstructive changes. Bladder is well distended. Stomach/Bowel: No obstructive or inflammatory changes of the colon are seen. Changes of prior colon resection are noted with patent anastomosis in the mid abdomen. No small bowel or gastric abnormality is seen. Vascular/Lymphatic: Mild atherosclerotic calcifications are noted. Left femoral central venous line extending into the IVC is noted. Reproductive: Status post hysterectomy. No adnexal masses. Other: No abdominal wall hernia or abnormality. No abdominopelvic ascites. Musculoskeletal: Stable sclerotic focus adjacent to the left acetabulum is noted. Degenerative changes of the lumbar  spine are seen. IMPRESSION: No significant interval change from the prior exam of 06/27/2020. Chronic changes are noted. No acute abnormality to correspond with the given clinical history is noted. Electronically Signed   By: Inez Catalina M.D.   On: 07/19/2020 17:45   DG Chest Port 1 View  Result Date: 07/19/2020 CLINICAL DATA:  Sepsis, fever EXAM: PORTABLE CHEST 1 VIEW COMPARISON:  06/27/2020 FINDINGS: The heart size and mediastinal contours are stable. No focal airspace consolidation, pleural effusion, or pneumothorax. The visualized skeletal structures are unremarkable. IMPRESSION: No active disease. Electronically Signed   By: Davina Poke D.O.   On: 07/19/2020 14:40   US Abdomen Limited RUQ  Result Date: 07/19/2020 CLINICAL DATA:  Elevated LFTs. EXAM: ULTRASOUND ABDOMEN LIMITED RIGHT UPPER QUADRANT COMPARISON:  CT 06/27/2020.  Abdominal ultrasound 05/06/2020 FINDINGS: Gallbladder: Surgically absent. Common bile duct: Diameter: 4 mm, normal. Liver: No focal lesion identified. Mildly heterogeneous in parenchymal echogenicity. Mild central intrahepatic biliary ductal dilatation. Pneumobilia on prior CT is not well demonstrated. Portal vein is patent on color  Doppler imaging with normal direction of blood flow towards the liver. Other: No visualized ascites. IMPRESSION: 1. Mild central intrahepatic biliary ductal dilatation, of doubtful clinical significance. Common bile duct is normal at 4 mm. Pneumobilia on prior CT is not well demonstrated sonographically. 2. Mild heterogeneity of the hepatic parenchyma without focal lesion. 3. Cholecystectomy. Electronically Signed   By: Keith Rake M.D.   On: 07/19/2020 16:48    Labs:  CBC: Recent Labs    07/01/20 0544 07/12/20 1121 07/19/20 1332 07/20/20 0331  WBC 10.4 6.5 12.2* 8.6  HGB 8.7* 9.5* 8.4* 7.3*  HCT 26.1* 28.1* 26.3* 22.4*  PLT 117* 203 209 164    COAGS: Recent Labs    01/02/20 1139 03/05/20 0048 06/27/20 2043 07/20/20 0331  INR 1.1 1.0 1.0 1.1  APTT  --  24 28 40*    BMP: Recent Labs    07/02/20 0600 07/12/20 1121 07/19/20 1332 07/20/20 0331  NA 138 136 135 136  K 4.3 4.0 4.4 4.1  CL 110 96* 96* 104  CO2 19* 28 24 23   GLUCOSE 127* 104* 90 90  BUN 23* 37* 39* 28*  CALCIUM 9.3 9.7 9.7 9.1  CREATININE 1.63* 1.68* 2.24* 1.78*  GFRNONAA 34* 33* 23* 31*  GFRAA 40* 38* 27* 36*    LIVER FUNCTION TESTS: Recent Labs    07/02/20 0600 07/12/20 1121 07/19/20 1332 07/20/20 0331  BILITOT 0.7 1.0 2.4* 1.7*  AST 24 86* 245* 118*  ALT 57* 265* 332* 213*  ALKPHOS 188* 383* 646* 446*  PROT 6.4* 7.8 8.0 6.2*  ALBUMIN 2.7* 3.5 3.3* 2.5*    Assessment and Plan:  59 yo female with history of Crohn's disease with short gut syndrome and is chronically dependent upon TPN.  She has had numerous long-term central venous access procedures dating back to 2003 and reportedly occluded in her arms and neck regions bilaterally.  She currently has a chronic indwelling left femoral tunneled PICC which gets exchanged whenever she develops bacteremia.  Gram-negative rods are noted in the latest blood cultures.  Request now received from ID service for tunneled PICC exchange.   Details/risks of procedure, including but not limited to, internal bleeding, infection, injury to adjacent structures discussed with patient with her understanding and consent.  Procedure planned for later today.   Electronically Signed: D. Rowe Robert, PA-C 07/20/2020, 3:30 PM   I spent a total of 20 minutes at the the patient's  bedside AND on the patient's hospital floor or unit, greater than 50% of which was counseling/coordinating care for exchange of tunneled femoral central venous catheter    Patient ID: Caitlin Peters, female   DOB: 28-Feb-1961, 59 y.o.   MRN: [id number]

## 2020-07-20 NOTE — Consult Note (Signed)
[facility]       Reason for Consult: bacteremia   Referring Physician: Dr. Lupita Leash  Active Problems:   SIRS (systemic inflammatory response syndrome) ([place])   . acidophilus  1 capsule Oral Daily  . amLODipine  10 mg Oral Daily  . budesonide  9 mg Oral Daily  . buPROPion  200 mg Oral Daily  . Chlorhexidine Gluconate Cloth  6 each Topical Daily  . cholecalciferol  1,000 Units Oral Daily  . cycloSPORINE  1 drop Both Eyes BID  . Dextran 70-Hypromellose  1 drop Both Eyes QID  . dicyclomine  20 mg Oral TID  . DULoxetine  90 mg Oral Daily  . enoxaparin (LOVENOX) injection  30 mg Subcutaneous Q24H  . estradiol  2 mg Oral Daily  . hydrALAZINE  50 mg Oral TID  . methadone  5 mg Oral 5 X Daily  . metoprolol tartrate  100 mg Oral BID  . multivitamin with minerals  1 tablet Oral Daily  . pantoprazole  40 mg Oral Daily  . potassium chloride SA  40 mEq Oral Daily  . 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  Limited (or no) other access points to access.     Antibiotics: levaquin  HPI: Caitlin Peters is a 59 y.o. female with Crohn's disease, short gut and TPN dependent comes in with fever and blood cultures positive for Stenotrophomonas and E cloacae.  Feels better since admission.  She was altered with fever and chills.  No new issues with the line.  Chronic n/v, no changes.  No other complaints.  Previous blood cultures with STenotrophomonas and E cloacae.  Has had recent fevers but negative blood cultures.  Has most recently been on levaquin.  No associated rash.   Review of Systems:  Constitutional: negative for fatigue and malaise Respiratory: negative for cough All other systems reviewed and are negative    Past Medical History:  Diagnosis Date  . Acute pancreatitis  04/13/2020  . Anasarca 10/2019  . AVN (avascular necrosis of bone) ([place])   . Cataract   . Chronic pain syndrome   . CKD (chronic kidney disease), stage III   . Crohn disease ([place])   . Crohn disease ([place])   . Depression   . Diverticulosis   . GERD (gastroesophageal reflux disease)   . HTN (hypertension)   . IDA (iron deficiency anemia)   . Malnutrition ([place])   . Mass in chest   . Osteoporosis   . Pancreatitis   . Short gut syndrome   . Vitamin B12 deficiency     Social History   Tobacco Use  . Smoking status: Former Research scientist (life sciences)  . Smokeless tobacco: Never Used  Vaping Use  . Vaping Use: Never used  Substance Use Topics  . Alcohol use: Not Currently  . Drug use: Never    Family History  Problem Relation Age of Onset  . Breast cancer Sister   . Multiple sclerosis Sister   . Diabetes Sister   . Lupus Sister   . Colon cancer Other   . Crohn's disease Other   . Seizures Mother   . Glaucoma Mother   . CAD Father   . Heart disease Father   . Hypertension Father  Allergies  Allergen Reactions  . Cefepime Other (See Comments)    Neurotoxicity occurring in setting of AKI. Ceftriaxone tolerated during same admit  . Gabapentin Other (See Comments)    unknown  . Hyoscyamine Hives and Swelling    Legs swelling   Disorientation  . Lyrica [Pregabalin] Other (See Comments)    unknown  . Meperidine Hives    Other reaction(s): GI Upset Due to [place]   . Topamax [Topiramate] Other (See Comments)    unknown  . Fentanyl Rash    Pt is allergic to fentanyl patch related to the glue (gives her a rash) Pt states she is NOT allergic to fentanyl IV medicine  . Morphine And Related Rash    Physical Exam: Constitutional: in no apparent distress  Vitals:   07/20/20 0622 07/20/20 1049  BP: (!) 157/69 (!) 159/69  Pulse: 70 66  Resp: 17 20  Temp: 99.4 F (37.4 C) 98.8 F (37.1 C)  SpO2: 100% 99%   EYES: anicteric Cardiovascular: Cor RRR Respiratory:  clear Musculoskeletal: no pedal edema noted; left femoral line with no surrounding erythema Skin: negatives: no rash Neuro: non-focal  Lab Results  Component Value Date   WBC 8.6 07/20/2020   HGB 7.3 (L) 07/20/2020   HCT 22.4 (L) 07/20/2020   MCV 93.7 07/20/2020   PLT 164 07/20/2020    Lab Results  Component Value Date   CREATININE 1.78 (H) 07/20/2020   BUN 28 (H) 07/20/2020   NA 136 07/20/2020   K 4.1 07/20/2020   CL 104 07/20/2020   CO2 23 07/20/2020    Lab Results  Component Value Date   ALT 213 (H) 07/20/2020   AST 118 (H) 07/20/2020   ALKPHOS 446 (H) 07/20/2020     Microbiology: Recent Results (from the past 240 hour(s))  Blood culture (routine x 2)     Status: None   Collection Time: 07/12/20 11:40 AM   Specimen: Left Antecubital; Blood  Result Value Ref Range Status   Specimen Description   Final    LEFT ANTECUBITAL Performed at [facility], [place] [address] [place], [place] [address]    Special Requests   Final    BOTTLES DRAWN AEROBIC AND ANAEROBIC Blood Culture adequate volume Performed at [facility] [address], [place], [place] [address]    Culture   Final    NO GROWTH 5 DAYS Performed at [facility], [place] [address] [place],  [address]    Report Status 07/17/2020 FINAL  Final  SARS Coronavirus 2 by RT PCR (hospital order, performed in [facility] hospital lab) Nasopharyngeal Nasopharyngeal Swab     Status: None   Collection Time: 07/12/20 11:40 AM   Specimen: Nasopharyngeal Swab  Result Value Ref Range Status   SARS Coronavirus 2 NEGATIVE NEGATIVE Final    Comment: (NOTE) SARS-CoV-2 target nucleic acids are NOT DETECTED.  The SARS-CoV-2 RNA is generally detectable in upper and lower respiratory specimens during the acute phase of infection. The lowest concentration of SARS-CoV-2 viral copies this assay can detect is 250 copies / mL. A negative result does not preclude SARS-CoV-2  infection and should not be used as the sole basis for treatment or other patient management decisions.  A negative result may occur with improper specimen collection / handling, submission of specimen other than nasopharyngeal swab, presence of viral mutation(s) within the areas targeted by this assay, and inadequate number of viral copies (<250 copies / mL). A negative result  must be combined with clinical observations, patient history, and epidemiological information.  Fact Sheet for Patients:   StrictlyIdeas.no  Fact Sheet for Healthcare Providers: BankingDealers.co.za  This test is not yet approved or  cleared by the [place] FDA and has been authorized for detection and/or diagnosis of SARS-CoV-2 by FDA under an Emergency Use Authorization (EUA).  This EUA will remain in effect (meaning this test can be used) for the duration of the COVID-19 declaration under Section 564(b)(1) of the Act, 21 U.S.C. section 360bbb-3(b)(1), unless the authorization is terminated or revoked sooner.  Performed at [facility], [place] [address], [place], [place] [address]   Culture, blood (routine x 2)     Status: None (Preliminary result)   Collection Time: 07/19/20  1:21 PM   Specimen: BLOOD  Result Value Ref Range Status   Specimen Description   Final    BLOOD PICC LINE Performed at [facility] [address] [place], [place] [address]    Special Requests   Final    BOTTLES DRAWN AEROBIC AND ANAEROBIC Blood Culture adequate volume Performed at [facility] [address] [place], [place] [address]    Culture  Setup Time   Final    GRAM NEGATIVE RODS IN BOTH AEROBIC AND ANAEROBIC BOTTLES Organism ID to follow CRITICAL RESULT CALLED TO, READ BACK BY AND VERIFIED WITH: Seleta Rhymes PharmD 9:10 07/20/20 ([email or web address])    Culture   Final    NO GROWTH < 24 HOURS Performed at [facility], [address], [place], [place] [address]    Report Status PENDING  Incomplete  Blood Culture ID Panel (Reflexed)     Status: Abnormal   Collection Time: 07/19/20  1:21 PM  Result Value Ref Range Status   Enterococcus faecalis NOT DETECTED NOT DETECTED Final   Enterococcus Faecium NOT DETECTED NOT DETECTED Final   Listeria monocytogenes NOT DETECTED NOT DETECTED Final   Staphylococcus species NOT DETECTED NOT DETECTED Final   Staphylococcus aureus (BCID) NOT DETECTED NOT DETECTED Final   Staphylococcus epidermidis NOT DETECTED NOT DETECTED Final   Staphylococcus lugdunensis NOT DETECTED NOT DETECTED Final   Streptococcus species NOT DETECTED NOT DETECTED Final   Streptococcus agalactiae NOT DETECTED NOT DETECTED Final   Streptococcus pneumoniae NOT DETECTED NOT DETECTED Final   Streptococcus pyogenes NOT DETECTED NOT DETECTED Final   A.calcoaceticus-baumannii NOT DETECTED NOT DETECTED Final   Bacteroides fragilis NOT DETECTED NOT DETECTED Final   Enterobacterales DETECTED (A) NOT DETECTED Final    Comment: Enterobacterales represent a large order of gram negative bacteria, not a single organism. CRITICAL RESULT CALLED TO, READ BACK BY AND VERIFIED WITH: Seleta Rhymes PharmD 9:10 07/20/20 ([email or web address])    Enterobacter cloacae complex DETECTED (A) NOT DETECTED Final    Comment: CRITICAL RESULT CALLED TO, READ BACK BY AND VERIFIED WITH: Seleta Rhymes PharmD 9:10 07/20/20 ([email or web address])    Escherichia coli NOT DETECTED NOT DETECTED Final   Klebsiella aerogenes NOT DETECTED NOT DETECTED Final   Klebsiella oxytoca NOT DETECTED NOT DETECTED Final   Klebsiella pneumoniae NOT DETECTED NOT DETECTED Final   Proteus species NOT DETECTED NOT DETECTED Final   Salmonella species NOT DETECTED NOT DETECTED Final   Serratia marcescens NOT DETECTED NOT DETECTED Final   Haemophilus influenzae NOT DETECTED NOT DETECTED Final   Neisseria meningitidis NOT DETECTED NOT DETECTED Final   Pseudomonas aeruginosa  NOT DETECTED NOT DETECTED Final   Stenotrophomonas maltophilia DETECTED (A) NOT DETECTED Final  Comment: CRITICAL RESULT CALLED TO, READ BACK BY AND VERIFIED WITH: Seleta Rhymes PharmD 9:10 07/20/20 ([email or web address])    Candida albicans NOT DETECTED NOT DETECTED Final   Candida auris NOT DETECTED NOT DETECTED Final   Candida glabrata NOT DETECTED NOT DETECTED Final   Candida krusei NOT DETECTED NOT DETECTED Final   Candida parapsilosis NOT DETECTED NOT DETECTED Final   Candida tropicalis NOT DETECTED NOT DETECTED Final   Cryptococcus neoformans/gattii NOT DETECTED NOT DETECTED Final   CTX-M ESBL NOT DETECTED NOT DETECTED Final   Carbapenem resistance IMP NOT DETECTED NOT DETECTED Final   Carbapenem resistance KPC NOT DETECTED NOT DETECTED Final   Carbapenem resistance NDM NOT DETECTED NOT DETECTED Final   Carbapenem resist OXA 48 LIKE NOT DETECTED NOT DETECTED Final   Carbapenem resistance VIM NOT DETECTED NOT DETECTED Final    Comment: Performed at [facility], [address] [place], [place] [address]  Culture, blood (routine x 2)     Status: None (Preliminary result)   Collection Time: 07/19/20  1:26 PM   Specimen: BLOOD RIGHT FOREARM  Result Value Ref Range Status   Specimen Description   Final    BLOOD RIGHT FOREARM Performed at [facility] [address] [place], [place] [address]    Special Requests   Final    BOTTLES DRAWN AEROBIC AND ANAEROBIC Blood Culture adequate volume Performed at [facility] [address] [place], [place] [address]    Culture   Final    NO GROWTH < 24 HOURS Performed at [facility] [address] [place], [place] [address]    Report Status PENDING  Incomplete  SARS Coronavirus 2 by RT PCR (hospital order, performed in [facility] hospital lab) Nasopharyngeal Nasopharyngeal Swab     Status: None   Collection Time: 07/19/20  1:32 PM   Specimen: Nasopharyngeal Swab  Result Value Ref Range Status    SARS Coronavirus 2 NEGATIVE NEGATIVE Final    Comment: (NOTE) SARS-CoV-2 target nucleic acids are NOT DETECTED.  The SARS-CoV-2 RNA is generally detectable in upper and lower respiratory specimens during the acute phase of infection. The lowest concentration of SARS-CoV-2 viral copies this assay can detect is 250 copies / mL. A negative result does not preclude SARS-CoV-2 infection and should not be used as the sole basis for treatment or other patient management decisions.  A negative result may occur with improper specimen collection / handling, submission of specimen other than nasopharyngeal swab, presence of viral mutation(s) within the areas targeted by this assay, and inadequate number of viral copies (<250 copies / mL). A negative result must be combined with clinical observations, patient history, and epidemiological information.  Fact Sheet for Patients:   StrictlyIdeas.no  Fact Sheet for Healthcare Providers: BankingDealers.co.za  This test is not yet approved or  cleared by the [place] FDA and has been authorized for detection and/or diagnosis of SARS-CoV-2 by FDA under an Emergency Use Authorization (EUA).  This EUA will remain in effect (meaning this test can be used) for the duration of the COVID-19 declaration under Section 564(b)(1) of the Act, 21 U.S.C. section 360bbb-3(b)(1), unless the authorization is terminated or revoked sooner.  Performed at [facility], [place] [address], [place], [place] [address]     Iniko Robles W Amesha Bailey, MD [facility] [email or web address] 07/20/2020, 1:30 PM

## 2020-07-20 NOTE — Progress Notes (Signed)
PROGRESS NOTE    Caitlin Peters  VVO:[id number] DOB: June 24, 1961 DOA: 07/19/2020 PCP: [name], [name], MD    Brief Narrative:  59 y.o. female with history of Crohn's disease, pancreatitis, short gut syndrome, TPN dependence, CKD-3B, chronic diarrhea, HTN, malnutrition, anxiety, depression, tobacco use and recent hospitalization from 8/2-8/7 with fever of unknown origin presenting with altered mental status.  Patient is awake and alert but confused. She is only oriented to self. She tells me she had a fever, generalized body pain. She also felt weak and could not get up a function right. Per patient's daughter, patient had intermittent "fever" ranging from 99-100 since she left the hospital. She has chronic nausea and diarrhea unchanged from baseline.. However, she felt confused and altered this morning that prompted her to call EMS. She denies shortness of breath, UTI symptoms, facial droop, slurring or focal weakness. She is TPN dependent although she takes some p.o. denies sick contact.   Per daughter, patient smokes about 1/4 pack a day. Denies drinking alcohol or recreational drug use.  In ED, febrile to 102. Otherwise vital signs stable. WBC 12.2 with left shift. Hgb 8.4 (9.5 on 8/17). Cr 2.24 (baseline about 1.6). BUN 40. AST/ALT 245/332 (86/265 on 8/17). ALP 646. Ammonia 55. Lactic acid 1.1. TSH 0.297. CXR and CT abdomen and pelvis without acute finding. Urinalysis pending. Blood cultures obtained. Patient was started on broad-spectrum antibiotics and IV fluid. Hospitalist service was called for admission for possible sepsis.  Subjective:  No fever since admission. WBC count improved to 8.6k HB 7.3 g. Creatinine down to 1.7 from 2.2 Alert awake and oriented at baseline and confusion resolved  Assessment & Plan:  Gram-negative bacteremia 2 out of 4 blood culture with GNR Enterobacter and stenotrophomonas  And SEPSIS POA: Sepsis physiology resolved.  No fever since  admission, leukocytosis has resolved. CT abdomen pelvis no acute finding. Discussed with pharmacy antibiotics changing to Levaquin to cover both organisms.  Consulted Dr. Linus Salmons from [facility] for further recommendations.Patient is TPN dependent with a left thigh PICC line, reports has been there since October and last time same site was used to change her line.  Suspect PICC line to be the source.  Acute metabolic encephalopathy from above.  Mental status is at baseline and resolved.  Elevated LFTs:? Etiology, ? TPN. Liver function test improving. CT abd/pelvis  no acute finding. Right upper quadrant ultrasound CBD a 4 mm cholecystectomy status no other acute finding mild central intrahepatic biliary duct dilatation of doubtful clinical significance. Recent Labs  Lab 07/19/20 1332 07/20/20 0331  AST 245* 118*  ALT 332* 213*  ALKPHOS 646* 446*  BILITOT 2.4* 1.7*  PROT 8.0 6.2*  ALBUMIN 3.3* 2.5*  INR  --  1.1   AKI on CKD stage IIIb/azotemia likely prerenal: Renal function improving.  Encourage oral hydration . CONT GENTLE IVF. Recent Labs  Lab 07/19/20 1332 07/20/20 0331  BUN 39* 28*  CREATININE 2.24* 1.78*   Anemia of chronic disease hemoglobin downtrending likely hemodilution as well.  Repeat H&H in the morning transfuse if less than 7 g.  Crohn's disease Short gut syndrome/TPN dependent: On budesonide p.o. daily. TPN on hold.  Hypertension: BP controlled on amlodipine, hydralazine, metoprolol.  Anxiety/depression: Mood is stable continue her home medication  Chronic pain syndrome on methadone at home.  Resume methadone, asking for IV medication will limit minimize use of opiates especially IV  Leukocytosis bandemia resolved   DVT prophylaxis: enoxaparin (LOVENOX) injection 30 mg Start: 07/19/20 2200  Code Status:   Code Status: Full Code  Family Communication: plan of care discussed with patient at bedside.  Status is: Inpatient  Remains inpatient appropriate because:IV  treatments appropriate due to intensity of illness or inability to take PO and Inpatient level of care appropriate due to severity of illness   Dispo: The patient is from: Home              Anticipated d/c is to: Home              Anticipated d/c date is: 3 days              Patient currently is not medically stable to d/c.  Diet Order            Diet Heart Room service appropriate? Yes; Fluid consistency: Thin  Diet effective now                 Body mass index is 21.94 kg/m.  Consultants:see note  Procedures:see note Microbiology:see note Blood Culture    Component Value Date/Time   SDES  07/19/2020 1326    BLOOD RIGHT FOREARM Performed at [facility], [place] [address], [place], [place] [address]    SPECREQUEST  07/19/2020 1326    BOTTLES DRAWN AEROBIC AND ANAEROBIC Blood Culture adequate volume Performed at [place] [address], [place], [place] [address]    CULT  07/19/2020 1326    NO GROWTH < 24 HOURS Performed at [facility], [place] [address] [place],  [address]    REPTSTATUS PENDING 07/19/2020 1326    Other culture-see note  Medications: Scheduled Meds: . acidophilus  1 capsule Oral Daily  . amLODipine  10 mg Oral Daily  . budesonide  9 mg Oral Daily  . buPROPion  200 mg Oral Daily  . Chlorhexidine Gluconate Cloth  6 each Topical Daily  . cholecalciferol  1,000 Units Oral Daily  . cycloSPORINE  1 drop Both Eyes BID  . Dextran 70-Hypromellose  1 drop Both Eyes QID  . dicyclomine  20 mg Oral TID  . DULoxetine  90 mg Oral Daily  . enoxaparin (LOVENOX) injection  30 mg Subcutaneous Q24H  . estradiol  2 mg Oral Daily  . hydrALAZINE  50 mg Oral TID  . methadone  5 mg Oral 5 X Daily  . metoprolol tartrate  100 mg Oral BID  . multivitamin with minerals  1 tablet Oral Daily  . pantoprazole  40 mg Oral Daily  . potassium chloride SA  40 mEq Oral Daily  . vancomycin variable dose per unstable renal  function (pharmacist dosing)   Does not apply See admin instructions  . vitamin B-12  1,000 mcg Oral Daily   Continuous Infusions: . ceFEPime (MAXIPIME) IV    . lactated ringers 150 mL/hr at 07/20/20 0220  . metronidazole 500 mg (07/20/20 0601)    Antimicrobials: Anti-infectives (From admission, onward)   Start     Dose/Rate Route Frequency Ordered Stop   07/20/20 1400  ceFEPIme (MAXIPIME) 2 g in sodium chloride 0.9 % 100 mL IVPB  Status:  Discontinued        2 g 200 mL/hr over 30 Minutes Intravenous Every 24 hours 07/19/20 2025 07/20/20 0857   07/20/20 1000  ceFEPIme (MAXIPIME) 2 g in sodium chloride 0.9 % 100 mL IVPB        2 g 200 mL/hr over 30 Minutes Intravenous Every 12 hours 07/20/20 0857  07/19/20 2200  metroNIDAZOLE (FLAGYL) IVPB 500 mg        500 mg 100 mL/hr over 60 Minutes Intravenous Every 8 hours 07/19/20 1714     07/19/20 2024  vancomycin variable dose per unstable renal function (pharmacist dosing)         Does not apply See admin instructions 07/19/20 2025     07/19/20 1430  vancomycin (VANCOREADY) IVPB 1500 mg/300 mL        1,500 mg 150 mL/hr over 120 Minutes Intravenous  Once 07/19/20 1344 07/19/20 1744   07/19/20 1345  aztreonam (AZACTAM) 2 g in sodium chloride 0.9 % 100 mL IVPB  Status:  Discontinued        2 g 200 mL/hr over 30 Minutes Intravenous  Once 07/19/20 1333 07/19/20 1344   07/19/20 1345  metroNIDAZOLE (FLAGYL) IVPB 500 mg        500 mg 100 mL/hr over 60 Minutes Intravenous  Once 07/19/20 1333 07/19/20 1722   07/19/20 1345  vancomycin (VANCOCIN) IVPB 1000 mg/200 mL premix        1,000 mg 200 mL/hr over 60 Minutes Intravenous  Once 07/19/20 1333 07/19/20 1744   07/19/20 1345  ceFEPIme (MAXIPIME) 2 g in sodium chloride 0.9 % 100 mL IVPB        2 g 200 mL/hr over 30 Minutes Intravenous  Once 07/19/20 1342 07/19/20 1722       Objective: Vitals: Today's Vitals   07/19/20 2107 07/19/20 2120 07/20/20 0154 07/20/20 0622  BP: (!) 158/68  (!)  142/59 (!) 157/69  Pulse: 79  68 70  Resp: 16  16 17   Temp: 98.8 F (37.1 C)  99.4 F (37.4 C) 99.4 F (37.4 C)  TempSrc:   Oral Oral  SpO2: 100%  100% 100%  Weight: 65.5 kg     Height: 5' 8"  (1.727 m)     PainSc:  0-No pain      Intake/Output Summary (Last 24 hours) at 07/20/2020 0933 Last data filed at 07/20/2020 0601 Gross per 24 hour  Intake 4307.37 ml  Output 250 ml  Net 4057.37 ml   Filed Weights   07/19/20 2107  Weight: 65.5 kg   Weight change:    Intake/Output from previous day: 08/24 0701 - 08/25 0700 In: 4307.4 [I.V.:1407.4; IV Piggyback:2900] Out: 250 [Urine:250] Intake/Output this shift: No intake/output data recorded.  Examination:  General exam: AAOx3 ,NAD, weak appearing. HEENT:Oral mucosa moist, Ear/Nose WNL grossly,dentition normal. Respiratory system: bilaterally clear,no wheezing or crackles,no use of accessory muscle, non tender. Cardiovascular system: S1 & S2 +, regular, No JVD. Gastrointestinal system: Abdomen soft, NT,ND, BS+. Nervous System:Alert, awake, moving extremities and grossly nonfocal Extremities: No edema, distal peripheral pulses palpable.  Skin: No rashes,no icterus. MSK: Normal muscle bulk,tone, power Left high w/ PICC LINE  Data Reviewed: I have personally reviewed following labs and imaging studies CBC: Recent Labs  Lab 07/19/20 1332 07/20/20 0331  WBC 12.2* 8.6  NEUTROABS 10.5*  --   HGB 8.4* 7.3*  HCT 26.3* 22.4*  MCV 92.6 93.7  PLT 209 627   Basic Metabolic Panel: Recent Labs  Lab 07/19/20 1332 07/20/20 0331  NA 135 136  K 4.4 4.1  CL 96* 104  CO2 24 23  GLUCOSE 90 90  BUN 39* 28*  CREATININE 2.24* 1.78*  CALCIUM 9.7 9.1  MG  --  2.2  PHOS  --  3.0   GFR: Estimated Creatinine Clearance: 34.3 mL/min (A) (by C-G formula based on SCr of  1.78 mg/dL (H)). Liver Function Tests: Recent Labs  Lab 07/19/20 1332 07/20/20 0331  AST 245* 118*  ALT 332* 213*  ALKPHOS 646* 446*  BILITOT 2.4* 1.7*  PROT  8.0 6.2*  ALBUMIN 3.3* 2.5*   No results for input(s): LIPASE, AMYLASE in the last 168 hours. Recent Labs  Lab 07/19/20 1332 07/20/20 0331  AMMONIA 55* 29   Coagulation Profile: Recent Labs  Lab 07/20/20 0331  INR 1.1   Cardiac Enzymes: No results for input(s): CKTOTAL, CKMB, CKMBINDEX, TROPONINI in the last 168 hours. BNP (last 3 results) No results for input(s): PROBNP in the last 8760 hours. HbA1C: No results for input(s): HGBA1C in the last 72 hours. CBG: No results for input(s): GLUCAP in the last 168 hours. Lipid Profile: No results for input(s): CHOL, HDL, LDLCALC, TRIG, CHOLHDL, LDLDIRECT in the last 72 hours. Thyroid Function Tests: Recent Labs    07/19/20 1332  TSH 0.297*   Anemia Panel: Recent Labs    07/19/20 1332  VITAMINB12 3,028*   Sepsis Labs: Recent Labs  Lab 07/19/20 1332  LATICACIDVEN 1.1    Recent Results (from the past 240 hour(s))  Blood culture (routine x 2)     Status: None   Collection Time: 07/12/20 11:40 AM   Specimen: Left Antecubital; Blood  Result Value Ref Range Status   Specimen Description   Final    LEFT ANTECUBITAL Performed at [facility], [place] [address] [place], [place] [address]    Special Requests   Final    BOTTLES DRAWN AEROBIC AND ANAEROBIC Blood Culture adequate volume Performed at [place] [address], [place], [place] [address]    Culture   Final    NO GROWTH 5 DAYS Performed at [facility], [place] [address] [place], [place] [address]    Report Status 07/17/2020 FINAL  Final  SARS Coronavirus 2 by RT PCR (hospital order, performed in [facility] hospital lab) Nasopharyngeal Nasopharyngeal Swab     Status: None   Collection Time: 07/12/20 11:40 AM   Specimen: Nasopharyngeal Swab  Result Value Ref Range Status   SARS Coronavirus 2 NEGATIVE NEGATIVE Final    Comment: (NOTE) SARS-CoV-2 target nucleic acids are NOT DETECTED.  The SARS-CoV-2 RNA is  generally detectable in upper and lower respiratory specimens during the acute phase of infection. The lowest concentration of SARS-CoV-2 viral copies this assay can detect is 250 copies / mL. A negative result does not preclude SARS-CoV-2 infection and should not be used as the sole basis for treatment or other patient management decisions.  A negative result may occur with improper specimen collection / handling, submission of specimen other than nasopharyngeal swab, presence of viral mutation(s) within the areas targeted by this assay, and inadequate number of viral copies (<250 copies / mL). A negative result must be combined with clinical observations, patient history, and epidemiological information.  Fact Sheet for Patients:   StrictlyIdeas.no  Fact Sheet for Healthcare Providers: BankingDealers.co.za  This test is not yet approved or  cleared by the [place] FDA and has been authorized for detection and/or diagnosis of SARS-CoV-2 by FDA under an Emergency Use Authorization (EUA).  This EUA will remain in effect (meaning this test can be used) for the duration of the COVID-19 declaration under Section 564(b)(1) of the Act, 21 U.S.C. section 360bbb-3(b)(1), unless the authorization is terminated or revoked sooner.  Performed at [facility], [place] [address] [place], [place] [address]  Culture, blood (routine x 2)     Status: None (Preliminary result)   Collection Time: 07/19/20  1:21 PM   Specimen: BLOOD  Result Value Ref Range Status   Specimen Description   Final    BLOOD PICC LINE Performed at [facility], [place] [address] [place], [name] [address]    Special Requests   Final    BOTTLES DRAWN AEROBIC AND ANAEROBIC Blood Culture adequate volume Performed at [place] [address], [place], [place] [address]    Culture  Setup Time   Final    GRAM  NEGATIVE RODS IN BOTH AEROBIC AND ANAEROBIC BOTTLES Organism ID to follow CRITICAL RESULT CALLED TO, READ BACK BY AND VERIFIED WITH: Seleta Rhymes PharmD 9:10 07/20/20 ([email or web address])    Culture   Final    NO GROWTH < 24 HOURS Performed at [facility], [address], [place], [place] [address]    Report Status PENDING  Incomplete  Blood Culture ID Panel (Reflexed)     Status: Abnormal   Collection Time: 07/19/20  1:21 PM  Result Value Ref Range Status   Enterococcus faecalis NOT DETECTED NOT DETECTED Final   Enterococcus Faecium NOT DETECTED NOT DETECTED Final   Listeria monocytogenes NOT DETECTED NOT DETECTED Final   Staphylococcus species NOT DETECTED NOT DETECTED Final   Staphylococcus aureus (BCID) NOT DETECTED NOT DETECTED Final   Staphylococcus epidermidis NOT DETECTED NOT DETECTED Final   Staphylococcus lugdunensis NOT DETECTED NOT DETECTED Final   Streptococcus species NOT DETECTED NOT DETECTED Final   Streptococcus agalactiae NOT DETECTED NOT DETECTED Final   Streptococcus pneumoniae NOT DETECTED NOT DETECTED Final   Streptococcus pyogenes NOT DETECTED NOT DETECTED Final   A.calcoaceticus-baumannii NOT DETECTED NOT DETECTED Final   Bacteroides fragilis NOT DETECTED NOT DETECTED Final   Enterobacterales DETECTED (A) NOT DETECTED Final    Comment: Enterobacterales represent a large order of gram negative bacteria, not a single organism. CRITICAL RESULT CALLED TO, READ BACK BY AND VERIFIED WITH: Seleta Rhymes PharmD 9:10 07/20/20 ([email or web address])    Enterobacter cloacae complex DETECTED (A) NOT DETECTED Final    Comment: CRITICAL RESULT CALLED TO, READ BACK BY AND VERIFIED WITH: Seleta Rhymes PharmD 9:10 07/20/20 ([email or web address])    Escherichia coli NOT DETECTED NOT DETECTED Final   Klebsiella aerogenes NOT DETECTED NOT DETECTED Final   Klebsiella oxytoca NOT DETECTED NOT DETECTED Final   Klebsiella pneumoniae NOT DETECTED NOT DETECTED Final   Proteus species NOT DETECTED NOT DETECTED Final    Salmonella species NOT DETECTED NOT DETECTED Final   Serratia marcescens NOT DETECTED NOT DETECTED Final   Haemophilus influenzae NOT DETECTED NOT DETECTED Final   Neisseria meningitidis NOT DETECTED NOT DETECTED Final   Pseudomonas aeruginosa NOT DETECTED NOT DETECTED Final   Stenotrophomonas maltophilia DETECTED (A) NOT DETECTED Final    Comment: CRITICAL RESULT CALLED TO, READ BACK BY AND VERIFIED WITH: Seleta Rhymes PharmD 9:10 07/20/20 ([email or web address])    Candida albicans NOT DETECTED NOT DETECTED Final   Candida auris NOT DETECTED NOT DETECTED Final   Candida glabrata NOT DETECTED NOT DETECTED Final   Candida krusei NOT DETECTED NOT DETECTED Final   Candida parapsilosis NOT DETECTED NOT DETECTED Final   Candida tropicalis NOT DETECTED NOT DETECTED Final   Cryptococcus neoformans/gattii NOT DETECTED NOT DETECTED Final   CTX-M ESBL NOT DETECTED NOT DETECTED Final   Carbapenem resistance IMP NOT DETECTED NOT DETECTED Final   Carbapenem resistance KPC NOT DETECTED NOT DETECTED Final  Carbapenem resistance NDM NOT DETECTED NOT DETECTED Final   Carbapenem resist OXA 48 LIKE NOT DETECTED NOT DETECTED Final   Carbapenem resistance VIM NOT DETECTED NOT DETECTED Final    Comment: Performed at [facility], [place] [address] [place], [place] [address]  Culture, blood (routine x 2)     Status: None (Preliminary result)   Collection Time: 07/19/20  1:26 PM   Specimen: BLOOD RIGHT FOREARM  Result Value Ref Range Status   Specimen Description   Final    BLOOD RIGHT FOREARM Performed at [place] [address] [place], [place] [address]    Special Requests   Final    BOTTLES DRAWN AEROBIC AND ANAEROBIC Blood Culture adequate volume Performed at [place] [address], [place], [place] [address]    Culture   Final    NO GROWTH < 24 HOURS Performed at [place] [address] [place], [place] [address]    Report Status PENDING   Incomplete  SARS Coronavirus 2 by RT PCR (hospital order, performed in [facility] hospital lab) Nasopharyngeal Nasopharyngeal Swab     Status: None   Collection Time: 07/19/20  1:32 PM   Specimen: Nasopharyngeal Swab  Result Value Ref Range Status   SARS Coronavirus 2 NEGATIVE NEGATIVE Final    Comment: (NOTE) SARS-CoV-2 target nucleic acids are NOT DETECTED.  The SARS-CoV-2 RNA is generally detectable in upper and lower respiratory specimens during the acute phase of infection. The lowest concentration of SARS-CoV-2 viral copies this assay can detect is 250 copies / mL. A negative result does not preclude SARS-CoV-2 infection and should not be used as the sole basis for treatment or other patient management decisions.  A negative result may occur with improper specimen collection / handling, submission of specimen other than nasopharyngeal swab, presence of viral mutation(s) within the areas targeted by this assay, and inadequate number of viral copies (<250 copies / mL). A negative result must be combined with clinical observations, patient history, and epidemiological information.  Fact Sheet for Patients:   StrictlyIdeas.no  Fact Sheet for Healthcare Providers: BankingDealers.co.za  This test is not yet approved or  cleared by the [place] FDA and has been authorized for detection and/or diagnosis of SARS-CoV-2 by FDA under an Emergency Use Authorization (EUA).  This EUA will remain in effect (meaning this test can be used) for the duration of the COVID-19 declaration under Section 564(b)(1) of the Act, 21 U.S.C. section 360bbb-3(b)(1), unless the authorization is terminated or revoked sooner.  Performed at [facility], [place] [address] [place], [place] [address]       Radiology Studies: CT ABDOMEN PELVIS WO CONTRAST  Result Date: 07/19/2020 CLINICAL DATA:  Abdominal pain and fevers EXAM: CT ABDOMEN  AND PELVIS WITHOUT CONTRAST TECHNIQUE: Multidetector CT imaging of the abdomen and pelvis was performed following the standard protocol without IV contrast. COMPARISON:  06/27/2020 FINDINGS: Lower chest: No acute abnormality. Hepatobiliary: Pneumobilia is again identified. Gallbladder has been surgically removed. The liver is otherwise unremarkable. Pancreas: Stable prominence of the pancreatic duct is seen. The remainder of the pancreas is within normal limits. Spleen: Normal in size without focal abnormality. Adrenals/Urinary Tract: Adrenal glands are unremarkable. Kidneys demonstrate no renal calculi or urinary tract obstructive changes. Bladder is well distended. Stomach/Bowel: No obstructive or inflammatory changes of the colon are seen. Changes of prior colon resection are noted with patent anastomosis in the mid abdomen. No small bowel  or gastric abnormality is seen. Vascular/Lymphatic: Mild atherosclerotic calcifications are noted. Left femoral central venous line extending into the IVC is noted. Reproductive: Status post hysterectomy. No adnexal masses. Other: No abdominal wall hernia or abnormality. No abdominopelvic ascites. Musculoskeletal: Stable sclerotic focus adjacent to the left acetabulum is noted. Degenerative changes of the lumbar spine are seen. IMPRESSION: No significant interval change from the prior exam of 06/27/2020. Chronic changes are noted. No acute abnormality to correspond with the given clinical history is noted. Electronically Signed   By: Inez Catalina M.D.   On: 07/19/2020 17:45   DG Chest Port 1 View  Result Date: 07/19/2020 CLINICAL DATA:  Sepsis, fever EXAM: PORTABLE CHEST 1 VIEW COMPARISON:  06/27/2020 FINDINGS: The heart size and mediastinal contours are stable. No focal airspace consolidation, pleural effusion, or pneumothorax. The visualized skeletal structures are unremarkable. IMPRESSION: No active disease. Electronically Signed   By: Davina Poke D.O.   On:  07/19/2020 14:40   US Abdomen Limited RUQ  Result Date: 07/19/2020 CLINICAL DATA:  Elevated LFTs. EXAM: ULTRASOUND ABDOMEN LIMITED RIGHT UPPER QUADRANT COMPARISON:  CT 06/27/2020.  Abdominal ultrasound 05/06/2020 FINDINGS: Gallbladder: Surgically absent. Common bile duct: Diameter: 4 mm, normal. Liver: No focal lesion identified. Mildly heterogeneous in parenchymal echogenicity. Mild central intrahepatic biliary ductal dilatation. Pneumobilia on prior CT is not well demonstrated. Portal vein is patent on color Doppler imaging with normal direction of blood flow towards the liver. Other: No visualized ascites. IMPRESSION: 1. Mild central intrahepatic biliary ductal dilatation, of doubtful clinical significance. Common bile duct is normal at 4 mm. Pneumobilia on prior CT is not well demonstrated sonographically. 2. Mild heterogeneity of the hepatic parenchyma without focal lesion. 3. Cholecystectomy. Electronically Signed   By: Keith Rake M.D.   On: 07/19/2020 16:48     LOS: 1 day   Antonieta Pert, MD [facility]  07/20/2020, 9:33 AM

## 2020-07-20 NOTE — Procedures (Signed)
Interventional Radiology Procedure:   Indications: History of short gut syndrome with chronic left femoral central line.  Bacteremia and needs catheter exchange.  Procedure: Exchange of tunneled central liine  Findings: New dual lumen Powerline in left groin, tip in IVC.  Length - 42 cm.    Complications: None     EBL: less than 10 ml  Plan: Catheter is ready to use.    Epic Tribbett R. Anselm Pancoast, MD  Pager: [phone]

## 2020-07-20 NOTE — Evaluation (Signed)
Occupational Therapy Evaluation Patient Details Name: Caitlin Peters MRN: [id number] DOB: 09/07/1961 Today's Date: 07/20/2020    History of Present Illness Caitlin Peters is a 59 y.o. female with history of Crohn's disease, pancreatitis, short gut syndrome, TPN dependence, CKD-3B, chronic diarrhea, HTN, malnutrition, anxiety, depression, tobacco use and recent hospitalization from 8/2-8/7 with fever of unknown origin presenting with altered mental status. Admitted again with altered mental status and unknown source of fever.   Clinical Impression   Patient demonstrates good strength of upper extremities, ability to perform bed mobility and ambulation in room without DME and ADLs without assistance. Patient reports ambulating to bathroom without nursing assistance. Patient had one loss of balance she was able to correct and reports this is her baseline. Patient reports a fall in the tub and therapist recommended use of shower chair for safety. Patient reports she showers and toilets with supervision from family. Patient has no further OT needs.     Follow Up Recommendations  No OT follow up    Equipment Recommendations  Tub/shower seat    Recommendations for Other Services       Precautions / Restrictions Precautions Precautions: None Restrictions Weight Bearing Restrictions: No      Mobility Bed Mobility Overal bed mobility: Independent                Transfers Overall transfer level: Independent                    Balance Overall balance assessment: Mild deficits observed, not formally tested                                         ADL either performed or assessed with clinical judgement   ADL Overall ADL's : Modified independent                                       General ADL Comments: Patient demonstrates ability to perform lower body dressing, toileting and standing grooming task. Reports ambulating to bathroom with out  assistance.     Vision   Vision Assessment?: No apparent visual deficits     Perception     Praxis      Pertinent Vitals/Pain Pain Assessment: No/denies pain     Hand Dominance Right   Extremity/Trunk Assessment Upper Extremity Assessment Upper Extremity Assessment: Overall WFL for tasks assessed   Lower Extremity Assessment Lower Extremity Assessment: Defer to PT evaluation   Cervical / Trunk Assessment Cervical / Trunk Assessment: Normal   Communication Communication Communication: No difficulties   Cognition Arousal/Alertness: Awake/alert Behavior During Therapy: WFL for tasks assessed/performed Overall Cognitive Status: Within Functional Limits for tasks assessed                                     General Comments  One loss of balance with standing that patient was able to correct.    Exercises     Shoulder Instructions      Home Living Family/patient expects to be discharged to:: Private residence Living Arrangements: Children (Daughter and grandchildren.) Available Help at Discharge: Family;Available 24 hours/day Type of Home: Apartment     Entrance Stairs-Rails: Right Home Layout: One level     Bathroom  Shower/Tub: Teacher, early years/pre: Standard     Home Equipment: Environmental consultant - 2 wheels          Prior Functioning/Environment Level of Independence: Needs assistance  Gait / Transfers Assistance Needed: Uses walker on a "bad day" - but predominantly does not need. ADL's / Homemaking Assistance Needed: REports family watches her in the shower and assists her off the commode or out of the bed if needed "on a bad day."            OT Problem List:        OT Treatment/Interventions:      OT Goals(Current goals can be found in the care plan section) Acute Rehab OT Goals OT Goal Formulation: All assessment and education complete, DC therapy  OT Frequency:     Barriers to D/C:            Co-evaluation               AM-PAC OT "6 Clicks" Daily Activity     Outcome Measure Help from another person eating meals?: None Help from another person taking care of personal grooming?: None Help from another person toileting, which includes using toliet, bedpan, or urinal?: None   Help from another person to put on and taking off regular upper body clothing?: None Help from another person to put on and taking off regular lower body clothing?: None 6 Click Score: 20   End of Session Nurse Communication:  (okay to see per RN)  Activity Tolerance: Patient tolerated treatment well Patient left: in bed;with call bell/phone within reach  OT Visit Diagnosis: Muscle weakness (generalized) (M62.81)                Time: 1051-1110 OT Time Calculation (min): 19 min Charges:  OT General Charges $OT Visit: 1 Visit OT Evaluation $OT Eval Low Complexity: 1 Low  Caitlin Peters, OTR/L [place]  Office [phone] Pager: [phone]   Caitlin Peters 07/20/2020, 1:23 PM

## 2020-07-20 NOTE — Progress Notes (Signed)
Pharmacy Antibiotic Note Caitlin Peters is a 59 y.o. female with a history of Crohn's disease and short gut syndrome on TPN prior to admission, presented to the ED on 07/19/2020 with fever and AMS. Pt was started on broad coverage antibiotics of vancomycin, cefepime, and metronidazole for suspected sepsis. Blood cultures collected on 07/19/20, resulted back with 2 of 4 bottles with gram-negative rods (BCID =  Enterobacter cloacae complex and Stenotrophomonas maltophilia). Discussed with Dr. Lupita Leash, okay to change antibiotics to Levaquin at this time to cover for both organisms.  - History of CKD: scr down to 1.78, CrCl 34.3 mL/min - WBC down WNL  - Tmax of 102, but afebrile this morning  Plan: - D/c vancomycin, cefepime, and metronidazole - Initiate levofloxacin 750 mg IV q48h (dose adjusted for renal insufficiency)   Height: 5' 8"  (172.7 cm) Weight: 65.5 kg (144 lb 4.8 oz) IBW/kg (Calculated) : 63.9  Temp (24hrs), Avg:100 F (37.8 C), Min:98.8 F (37.1 C), Max:102 F (38.9 C)  Recent Labs  Lab 07/19/20 1332 07/20/20 0331  WBC 12.2* 8.6  CREATININE 2.24* 1.78*  LATICACIDVEN 1.1  --     Estimated Creatinine Clearance: 34.3 mL/min (A) (by C-G formula based on SCr of 1.78 mg/dL (H)).    Allergies  Allergen Reactions  . Cefepime Other (See Comments)    Neurotoxicity occurring in setting of AKI. Ceftriaxone tolerated during same admit  . Gabapentin Other (See Comments)    unknown  . Hyoscyamine Hives and Swelling    Legs swelling   Disorientation  . Lyrica [Pregabalin] Other (See Comments)    unknown  . Meperidine Hives    Other reaction(s): GI Upset Due to Chrones   . Topamax [Topiramate] Other (See Comments)    unknown  . Fentanyl Rash    Pt is allergic to fentanyl patch related to the glue (gives her a rash) Pt states she is NOT allergic to fentanyl IV medicine  . Morphine And Related Rash    Antimicrobials this admission: 8/24 Cefepime >>8/25 8/24 Vancomycin  >>8/25 8/24 metronidazole >>8/25 8/25 LVQ>>   Microbiology results: 8/24 COVID: neg 8/24 BCx x2: 2 of 4 bottles with GNR (BCID= enterobacter cloacae complex and stenotrophonomas)  Thank you for allowing pharmacy to be a part of this patient's care.  Penni Homans 07/20/2020 9:53 AM

## 2020-07-20 NOTE — Progress Notes (Signed)
Initial Nutrition Assessment  INTERVENTION:   -Recommend resume TPN once able  NUTRITION DIAGNOSIS:   Increased nutrient needs related to chronic illness, altered GI function (short gut syndrome) as evidenced by estimated needs.  GOAL:   Patient will meet greater than or equal to 90% of their needs  MONITOR:   PO intake, Labs, Weight trends, I & O's  REASON FOR ASSESSMENT:   Consult Assessment of nutrition requirement/status  ASSESSMENT:   59 y.o. female with history of Crohn's disease, pancreatitis, short gut syndrome, TPN dependence, CKD-3B, chronic diarrhea, HTN, malnutrition, anxiety, depression, tobacco use and recent hospitalization from 8/2-8/7 with fever of unknown origin presenting with altered mental status.  Patient is TPN dependent given short gut syndrome. Pt familiar to RD from previous admissions (last was earlier this month). Per MD note 8/24, TPN is being held d/t transaminitis. Recommend resuming home TPN when able given increased needs from [place].  Pt is on a regular diet. No PO documented yet. Pt does not drink protein supplements.  Per weight records, weight has remained stable.  Medications: Vitamin D, Multivitamin with minerals daily, KLOR-CON, Vitamin B-12 Labs reviewed: Mg/Phos WNL  NUTRITION - FOCUSED PHYSICAL EXAM:  Unable to complete at this time  Diet Order:   Diet Order            Diet Heart Room service appropriate? Yes; Fluid consistency: Thin  Diet effective now                 EDUCATION NEEDS:   No education needs have been identified at this time  Skin:  Skin Assessment: Reviewed RN Assessment  Last BM:  8/23  Height:   Ht Readings from Last 1 Encounters:  07/19/20 5' 8"  (1.727 m)    Weight:   Wt Readings from Last 1 Encounters:  07/19/20 65.5 kg   BMI:  Body mass index is 21.94 kg/m.  Estimated Nutritional Needs:   Kcal:  2000-2200  Protein:  100-115g  Fluid:  2L/day  Clayton Bibles, MS, RD,  LDN Inpatient Clinical Dietitian Contact information available via Amion

## 2020-07-20 NOTE — Plan of Care (Signed)

## 2020-07-20 NOTE — Progress Notes (Signed)
PHARMACY - PHYSICIAN COMMUNICATION CRITICAL VALUE ALERT - BLOOD CULTURE IDENTIFICATION (BCID)  Caitlin Peters is an 59 y.o. female with hx Crohn's disease and shot gut syndrome on TPN PTA, who presented to [facility] on 07/19/2020 with a chief complaint of fever and AMS.  She was started on broad abx with vancomycin, cefepime and flagyl for sepsis.  Two of four blood cx bottles are positive for GNR (BCID with enterobacter and stenotrophomonas)  Name of physician (or Provider) Contacted: Dr. Lupita Leash  Current antibiotics: vancomycin, cefepime and flagyl  Changes to prescribed antibiotics recommended:  - change abx to levaquin 750 mg IV q48h  Results for orders placed or performed during the hospital encounter of 07/19/20  Blood Culture ID Panel (Reflexed) (Collected: 07/19/2020  1:21 PM)  Result Value Ref Range   Enterococcus faecalis NOT DETECTED NOT DETECTED   Enterococcus Faecium NOT DETECTED NOT DETECTED   Listeria monocytogenes NOT DETECTED NOT DETECTED   Staphylococcus species NOT DETECTED NOT DETECTED   Staphylococcus aureus (BCID) NOT DETECTED NOT DETECTED   Staphylococcus epidermidis NOT DETECTED NOT DETECTED   Staphylococcus lugdunensis NOT DETECTED NOT DETECTED   Streptococcus species NOT DETECTED NOT DETECTED   Streptococcus agalactiae NOT DETECTED NOT DETECTED   Streptococcus pneumoniae NOT DETECTED NOT DETECTED   Streptococcus pyogenes NOT DETECTED NOT DETECTED   A.calcoaceticus-baumannii NOT DETECTED NOT DETECTED   Bacteroides fragilis NOT DETECTED NOT DETECTED   Enterobacterales DETECTED (A) NOT DETECTED   Enterobacter cloacae complex DETECTED (A) NOT DETECTED   Escherichia coli NOT DETECTED NOT DETECTED   Klebsiella aerogenes NOT DETECTED NOT DETECTED   Klebsiella oxytoca NOT DETECTED NOT DETECTED   Klebsiella pneumoniae NOT DETECTED NOT DETECTED   Proteus species NOT DETECTED NOT DETECTED   Salmonella species NOT DETECTED NOT DETECTED   Serratia marcescens NOT DETECTED  NOT DETECTED   Haemophilus influenzae NOT DETECTED NOT DETECTED   Neisseria meningitidis NOT DETECTED NOT DETECTED   Pseudomonas aeruginosa NOT DETECTED NOT DETECTED   Stenotrophomonas maltophilia DETECTED (A) NOT DETECTED   Candida albicans NOT DETECTED NOT DETECTED   Candida auris NOT DETECTED NOT DETECTED   Candida glabrata NOT DETECTED NOT DETECTED   Candida krusei NOT DETECTED NOT DETECTED   Candida parapsilosis NOT DETECTED NOT DETECTED   Candida tropicalis NOT DETECTED NOT DETECTED   Cryptococcus neoformans/gattii NOT DETECTED NOT DETECTED   CTX-M ESBL NOT DETECTED NOT DETECTED   Carbapenem resistance IMP NOT DETECTED NOT DETECTED   Carbapenem resistance KPC NOT DETECTED NOT DETECTED   Carbapenem resistance NDM NOT DETECTED NOT DETECTED   Carbapenem resist OXA 48 LIKE NOT DETECTED NOT DETECTED   Carbapenem resistance VIM NOT DETECTED NOT DETECTED    Lynelle Doctor 07/20/2020  9:27 AM

## 2020-07-21 DIAGNOSIS — R6510 Systemic inflammatory response syndrome (SIRS) of non-infectious origin without acute organ dysfunction: Secondary | ICD-10-CM

## 2020-07-21 LAB — BASIC METABOLIC PANEL
Anion gap: 8 (ref 5–15)
BUN: 18 mg/dL (ref 6–20)
CO2: 22 mmol/L (ref 22–32)
Calcium: 9.3 mg/dL (ref 8.9–10.3)
Chloride: 106 mmol/L (ref 98–111)
Creatinine, Ser: 1.7 mg/dL — ABNORMAL HIGH (ref 0.44–1.00)
GFR calc Af Amer: 38 mL/min — ABNORMAL LOW (ref >60)
GFR calc non Af Amer: 32 mL/min — ABNORMAL LOW (ref >60)
Glucose, Bld: 118 mg/dL — ABNORMAL HIGH (ref 70–99)
Potassium: 4.3 mmol/L (ref 3.5–5.1)
Sodium: 136 mmol/L (ref 135–145)

## 2020-07-21 LAB — CBC
HCT: 24.2 % — ABNORMAL LOW (ref 36.0–46.0)
Hemoglobin: 7.9 g/dL — ABNORMAL LOW (ref 12.0–15.0)
MCH: 30.5 pg (ref 26.0–34.0)
MCHC: 32.6 g/dL (ref 30.0–36.0)
MCV: 93.4 fL (ref 80.0–100.0)
Platelets: 220 10*3/uL (ref 150–400)
RBC: 2.59 MIL/uL — ABNORMAL LOW (ref 3.87–5.11)
RDW: 14.2 % (ref 11.5–15.5)
WBC: 5.9 10*3/uL (ref 4.0–10.5)
nRBC: 0 % (ref 0.0–0.2)

## 2020-07-21 LAB — URINE CULTURE: Culture: <10000 — AB

## 2020-07-21 LAB — HEPATIC FUNCTION PANEL
ALT: 150 U/L — ABNORMAL HIGH (ref 0–44)
AST: 45 U/L — ABNORMAL HIGH (ref 15–41)
Albumin: 2.8 g/dL — ABNORMAL LOW (ref 3.5–5.0)
Alkaline Phosphatase: 415 U/L — ABNORMAL HIGH (ref 38–126)
Bilirubin, Direct: 0.2 mg/dL (ref 0.0–0.2)
Indirect Bilirubin: 0.3 mg/dL (ref 0.3–0.9)
Total Bilirubin: 0.5 mg/dL (ref 0.3–1.2)
Total Protein: 7 g/dL (ref 6.5–8.1)

## 2020-07-21 MED ORDER — PROMETHAZINE HCL 25 MG/ML IJ SOLN
6.2500 mg | Freq: Three times a day (TID) | INTRAMUSCULAR | Status: DC | PRN
  Administered 2020-07-21 – 2020-07-22 (×4): 6.25 mg via INTRAVENOUS
  Filled 2020-07-21 (×5): qty 1

## 2020-07-21 NOTE — Care Management Important Message (Signed)
Important Message  Patient Details IM Letter presented to the Patient Name: Caitlin Peters MRN: [id number] Date of Birth: 06-29-1961   Medicare Important Message Given:  Yes     [name] 07/21/2020, 4:52 PM

## 2020-07-21 NOTE — Evaluation (Signed)
Physical Therapy Evaluation Patient Details Name: Caitlin Peters MRN: [id number] DOB: 1961/06/21 Today's Date: 07/21/2020   History of Present Illness  Caitlin Peters is a 59 y.o. female with history of Crohn's disease, pancreatitis, short gut syndrome, TPN dependence, CKD-3B, chronic diarrhea, HTN, malnutrition, anxiety, depression, tobacco use and recent hospitalization from 8/2-8/7 with fever of unknown origin presenting with altered mental status. Admitted again with altered mental status and unknown source of fever.  Clinical Impression  Pt ambulated 360' holding IV pole, no loss of balance. She is modified independent with mobility. No further PT indicated, will sign off. Encouraged pt to ambulate in halls to minimize deconditioning during hospitalization.    Follow Up Recommendations No PT follow up    Equipment Recommendations  None recommended by PT    Recommendations for Other Services       Precautions / Restrictions Precautions Precautions: Fall Precaution Comments: pt reports ~5 falls in past 1 year, they occur when her Crohn's flares, no falls recently Restrictions Weight Bearing Restrictions: No      Mobility  Bed Mobility Overal bed mobility: Independent                Transfers Overall transfer level: Independent                  Ambulation/Gait Ambulation/Gait assistance: Independent Gait Distance (Feet): 360 Feet Assistive device: IV Pole Gait Pattern/deviations: WFL(Within Functional Limits) Gait velocity: decr   General Gait Details: steady, no loss of balance  Stairs            Wheelchair Mobility    Modified Rankin (Stroke Patients Only)       Balance Overall balance assessment: Modified Independent                                           Pertinent Vitals/Pain Pain Assessment: No/denies pain    Home Living Family/patient expects to be discharged to:: Private residence Living Arrangements:  Children (Daughter and grandchildren.) Available Help at Discharge: Family;Available 24 hours/day Type of Home: Apartment   Entrance Stairs-Rails: Right   Home Layout: One level Home Equipment: Walker - 2 wheels;Walker - 4 wheels      Prior Function Level of Independence: Needs assistance   Gait / Transfers Assistance Needed: Uses walker on a "bad day" - but predominantly does not need. Uses rollator when going out.  ADL's / Homemaking Assistance Needed: REports family watches her in the shower and assists her off the commode or out of the bed if needed "on a bad day."  Comments: walks her dog     Hand Dominance   Dominant Hand: Right    Extremity/Trunk Assessment   Upper Extremity Assessment Upper Extremity Assessment: Defer to OT evaluation    Lower Extremity Assessment Lower Extremity Assessment: Overall WFL for tasks assessed    Cervical / Trunk Assessment Cervical / Trunk Assessment: Normal  Communication   Communication: No difficulties  Cognition Arousal/Alertness: Awake/alert Behavior During Therapy: WFL for tasks assessed/performed Overall Cognitive Status: Within Functional Limits for tasks assessed                                        General Comments      Exercises     Assessment/Plan    PT  Assessment Patent does not need any further PT services  PT Problem List         PT Treatment Interventions      PT Goals (Current goals can be found in the Care Plan section)  Acute Rehab PT Goals PT Goal Formulation: All assessment and education complete, DC therapy    Frequency     Barriers to discharge        Co-evaluation               AM-PAC PT "6 Clicks" Mobility  Outcome Measure Help needed turning from your back to your side while in a flat bed without using bedrails?: None Help needed moving from lying on your back to sitting on the side of a flat bed without using bedrails?: None Help needed moving to and from  a bed to a chair (including a wheelchair)?: None Help needed standing up from a chair using your arms (e.g., wheelchair or bedside chair)?: None Help needed to walk in hospital room?: None Help needed climbing 3-5 steps with a railing? : None 6 Click Score: 24    End of Session   Activity Tolerance: Patient tolerated treatment well Patient left: in bed;with call bell/phone within reach Nurse Communication: Mobility status      Time: 2111-5520 PT Time Calculation (min) (ACUTE ONLY): 18 min   Charges:   PT Evaluation $PT Eval Low Complexity: 1 Low          Philomena Doheny PT 07/21/2020  Acute Rehabilitation Services Pager [phone] Office [phone]

## 2020-07-21 NOTE — Progress Notes (Signed)
PROGRESS NOTE    Caitlin Peters  JFH:[id number] DOB: 1961-06-14 DOA: 07/19/2020 PCP: [name], [name], MD    Brief Narrative:  59 y.o. female with history of Crohn's disease, pancreatitis, short gut syndrome, TPN dependence, CKD-3B, chronic diarrhea, HTN, malnutrition, anxiety, depression, tobacco use and recent hospitalization from 8/2-8/7 with fever of unknown origin presenting with altered mental status.  Patient is awake and alert but confused. She is only oriented to self. She tells me she had a fever, generalized body pain. She also felt weak and could not get up a function right. Per patient's daughter, patient had intermittent "fever" ranging from 99-100 since she left the hospital. She has chronic nausea and diarrhea unchanged from baseline.. However, she felt confused and altered this morning that prompted her to call EMS. She denies shortness of breath, UTI symptoms, facial droop, slurring or focal weakness. She is TPN dependent although she takes some p.o. denies sick contact.   Per daughter, patient smokes about 1/4 pack a day. Denies drinking alcohol or recreational drug use.  In ED, febrile to 102. Otherwise vital signs stable. WBC 12.2 with left shift. Hgb 8.4 (9.5 on 8/17). Cr 2.24 (baseline about 1.6). BUN 40. AST/ALT 245/332 (86/265 on 8/17). ALP 646. Ammonia 55. Lactic acid 1.1. TSH 0.297. CXR and CT abdomen and pelvis without acute finding. Urinalysis pending. Blood cultures obtained. Patient was started on broad-spectrum antibiotics and IV fluid. Hospitalist service was called for admission for possible sepsis.  Subjective: Seen this morning has been having nausea complaints of pain needing IV Dilaudid. She is alert awake oriented.  She does not feel ready yet  Assessment & Plan:  Gram-negative bacteremia 2 out of 4 blood culture with GNR Enterobacter and stenotrophomonas  And SEPSIS POA: Sepsis physiology resolved.   bacteremia in the setting of her left  femoral line.  She has limited/no other access points for iv access. CT abdomen pelvis no acute finding.  IR changed the PICC line on the left thigh, and is now on Bactrim.  Await for further culture report  Acute metabolic encephalopathy from above.  Mental status is at baseline and resolved.  Elevated LFTs:? Etiology, ? TPN.  LFTs improving nicely. CT abd/pelvis  no acute finding. Right upper quadrant ultrasound CBD a 4 mm cholecystectomy status no other acute finding mild central intrahepatic biliary duct dilatation of doubtful clinical significance. Recent Labs  Lab 07/19/20 1332 07/20/20 0331 07/21/20 0906  AST 245* 118* 45*  ALT 332* 213* 150*  ALKPHOS 646* 446* 415*  BILITOT 2.4* 1.7* 0.5  PROT 8.0 6.2* 7.0  ALBUMIN 3.3* 2.5* 2.8*  INR  --  1.1  --    AKI on CKD stage IIIb/azotemia likely prerenal: Renal function improving. cont  Hydration, more or less at baseline creatinine around 1.6-1.9.  Recent Labs  Lab 07/19/20 1332 07/20/20 0331 07/21/20 0906  BUN 39* 28* 18  CREATININE 2.24* 1.78* 1.70*   Anemia of chronic disease hemoglobin downtrending likely hemodilution as well.  0.9 g today.  No obvious bleeding noted.   Crohn's disease Short gut syndrome/TPN dependent: On budesonide p.o. daily. TPN on hold.  Hypertension: BP is well controlled on amlodipine, hydralazine, metoprolol.  Anxiety/depression: Mood is stable continue her home medication  Chronic pain syndrome on methadone at home.  Continue methadone, requesting IV Dilaudid while here, and nausea medication Phenergan  Leukocytosis bandemia resolved   DVT prophylaxis: enoxaparin (LOVENOX) injection 30 mg Start: 07/19/20 2200 Code Status:   Code Status: Full  Code  Family Communication: plan of care discussed with patient at bedside.  Status is: Inpatient  Remains inpatient appropriate because:IV treatments appropriate due to intensity of illness or inability to take PO and Inpatient level of care appropriate  due to severity of illness   Dispo: The patient is from: Home              Anticipated d/c is to: Home              Anticipated d/c date is: 1 day              Patient currently is not medically stable to d/c.  Diet Order            Diet Heart Room service appropriate? Yes; Fluid consistency: Thin  Diet effective now                 Body mass index is 21.94 kg/m.  Consultants:see note  Procedures:see note Microbiology:see note Blood Culture    Component Value Date/Time   SDES  07/19/2020 1326    BLOOD RIGHT FOREARM Performed at [facility], [place] [address], [place], [place] [address]    SPECREQUEST  07/19/2020 1326    BOTTLES DRAWN AEROBIC AND ANAEROBIC Blood Culture adequate volume Performed at [facility] [address] [place], [place] [address]    CULT  07/19/2020 1326    GRAM NEGATIVE RODS IDENTIFICATION TO FOLLOW Performed at [facility], [place] [address] [place], [place] [address]    REPTSTATUS PENDING 07/19/2020 1326    Other culture-see note  Medications: Scheduled Meds:  acidophilus  1 capsule Oral Daily   amLODipine  10 mg Oral Daily   budesonide  9 mg Oral Daily   buPROPion  200 mg Oral Daily   Chlorhexidine Gluconate Cloth  6 each Topical Daily   cholecalciferol  1,000 Units Oral Daily   cycloSPORINE  1 drop Both Eyes BID   dicyclomine  20 mg Oral TID   DULoxetine  90 mg Oral Daily   enoxaparin (LOVENOX) injection  30 mg Subcutaneous Q24H   estradiol  2 mg Oral Daily   hydrALAZINE  50 mg Oral TID   methadone  5 mg Oral 5 X Daily   metoprolol tartrate  100 mg Oral BID   multivitamin with minerals  1 tablet Oral Daily   pantoprazole  40 mg Oral Daily   polyvinyl alcohol  1 drop Both Eyes QID   potassium chloride SA  40 mEq Oral Daily   vitamin B-12  1,000 mcg Oral Daily   Continuous Infusions:  lactated ringers 50 mL/hr at 07/20/20 1323   sulfamethoxazole-trimethoprim 320  mg (07/21/20 1102)    Antimicrobials: Anti-infectives (From admission, onward)   Start     Dose/Rate Route Frequency Ordered Stop   07/20/20 1400  ceFEPIme (MAXIPIME) 2 g in sodium chloride 0.9 % 100 mL IVPB  Status:  Discontinued        2 g 200 mL/hr over 30 Minutes Intravenous Every 24 hours 07/19/20 2025 07/20/20 0857   07/20/20 1400  sulfamethoxazole-trimethoprim (BACTRIM) 320 mg in dextrose 5 % 500 mL IVPB        320 mg 346.7 mL/hr over 90 Minutes Intravenous Every 12 hours 07/20/20 1322     07/20/20 1000  ceFEPIme (MAXIPIME) 2 g in sodium chloride 0.9 % 100 mL IVPB  Status:  Discontinued        2 g 200 mL/hr over 30  Minutes Intravenous Every 12 hours 07/20/20 0857 07/20/20 0941   07/20/20 1000  levofloxacin (LEVAQUIN) IVPB 750 mg  Status:  Discontinued        750 mg 100 mL/hr over 90 Minutes Intravenous Every 48 hours 07/20/20 0949 07/20/20 1322   07/19/20 2200  metroNIDAZOLE (FLAGYL) IVPB 500 mg  Status:  Discontinued        500 mg 100 mL/hr over 60 Minutes Intravenous Every 8 hours 07/19/20 1714 07/20/20 0941   07/19/20 2024  vancomycin variable dose per unstable renal function (pharmacist dosing)  Status:  Discontinued         Does not apply See admin instructions 07/19/20 2025 07/20/20 0941   07/19/20 1430  vancomycin (VANCOREADY) IVPB 1500 mg/300 mL        1,500 mg 150 mL/hr over 120 Minutes Intravenous  Once 07/19/20 1344 07/19/20 1744   07/19/20 1345  aztreonam (AZACTAM) 2 g in sodium chloride 0.9 % 100 mL IVPB  Status:  Discontinued        2 g 200 mL/hr over 30 Minutes Intravenous  Once 07/19/20 1333 07/19/20 1344   07/19/20 1345  metroNIDAZOLE (FLAGYL) IVPB 500 mg        500 mg 100 mL/hr over 60 Minutes Intravenous  Once 07/19/20 1333 07/19/20 1722   07/19/20 1345  vancomycin (VANCOCIN) IVPB 1000 mg/200 mL premix        1,000 mg 200 mL/hr over 60 Minutes Intravenous  Once 07/19/20 1333 07/19/20 1744   07/19/20 1345  ceFEPIme (MAXIPIME) 2 g in sodium chloride 0.9 %  100 mL IVPB        2 g 200 mL/hr over 30 Minutes Intravenous  Once 07/19/20 1342 07/19/20 1722       Objective: Vitals: Today's Vitals   07/21/20 1040 07/21/20 1041 07/21/20 1346 07/21/20 1346  BP: (!) 143/68  131/78 131/78  Pulse: (!) 46 (!) 107 68 68  Resp:   17 17  Temp:   97.8 F (36.6 C) 97.8 F (36.6 C)  TempSrc:   Oral Oral  SpO2: 99% 100% 99% 99%  Weight:      Height:      PainSc:        Intake/Output Summary (Last 24 hours) at 07/21/2020 1427 Last data filed at 07/21/2020 1305 Gross per 24 hour  Intake 40.37 ml  Output 1500 ml  Net -1459.63 ml   Filed Weights   07/19/20 2107  Weight: 65.5 kg   Weight change:    Intake/Output from previous day: 08/25 0701 - 08/26 0700 In: 40.4 [I.V.:0.2; IV Piggyback:40.1] Out: 800 [Urine:800] Intake/Output this shift: Total I/O In: -  Out: 700 [Urine:700]  Examination:  General exam: AAOx3 , NAD, weak appearing. HEENT:Oral mucosa moist, Ear/Nose WNL grossly, dentition normal. Respiratory system: bilaterally clear,no wheezing or crackles,no use of accessory muscle Cardiovascular system: S1 & S2 +, No JVD,. Gastrointestinal system: Abdomen soft, NT,ND, BS+ Nervous System:Alert, awake, moving extremities and grossly nonfocal Extremities: No edema, distal peripheral pulses palpable.  Skin: No rashes,no icterus. MSK: Normal muscle bulk,tone, power Left thigh with new picc line.  Left high w/ PICC LINE  Data Reviewed: I have personally reviewed following labs and imaging studies CBC: Recent Labs  Lab 07/19/20 1332 07/20/20 0331 07/21/20 0906  WBC 12.2* 8.6 5.9  NEUTROABS 10.5*  --   --   HGB 8.4* 7.3* 7.9*  HCT 26.3* 22.4* 24.2*  MCV 92.6 93.7 93.4  PLT 209 164 016   Basic Metabolic Panel: Recent  Labs  Lab 07/19/20 1332 07/20/20 0331 07/21/20 0906  NA 135 136 136  K 4.4 4.1 4.3  CL 96* 104 106  CO2 24 23 22   GLUCOSE 90 90 118*  BUN 39* 28* 18  CREATININE 2.24* 1.78* 1.70*  CALCIUM 9.7 9.1 9.3    MG  --  2.2  --   PHOS  --  3.0  --    GFR: Estimated Creatinine Clearance: 35.9 mL/min (A) (by C-G formula based on SCr of 1.7 mg/dL (H)). Liver Function Tests: Recent Labs  Lab 07/19/20 1332 07/20/20 0331 07/21/20 0906  AST 245* 118* 45*  ALT 332* 213* 150*  ALKPHOS 646* 446* 415*  BILITOT 2.4* 1.7* 0.5  PROT 8.0 6.2* 7.0  ALBUMIN 3.3* 2.5* 2.8*   No results for input(s): LIPASE, AMYLASE in the last 168 hours. Recent Labs  Lab 07/19/20 1332 07/20/20 0331  AMMONIA 55* 29   Coagulation Profile: Recent Labs  Lab 07/20/20 0331  INR 1.1   Cardiac Enzymes: No results for input(s): CKTOTAL, CKMB, CKMBINDEX, TROPONINI in the last 168 hours. BNP (last 3 results) No results for input(s): PROBNP in the last 8760 hours. HbA1C: No results for input(s): HGBA1C in the last 72 hours. CBG: No results for input(s): GLUCAP in the last 168 hours. Lipid Profile: No results for input(s): CHOL, HDL, LDLCALC, TRIG, CHOLHDL, LDLDIRECT in the last 72 hours. Thyroid Function Tests: Recent Labs    07/19/20 1332  TSH 0.297*   Anemia Panel: Recent Labs    07/19/20 1332  VITAMINB12 3,028*   Sepsis Labs: Recent Labs  Lab 07/19/20 1332  LATICACIDVEN 1.1    Recent Results (from the past 240 hour(s))  Blood culture (routine x 2)     Status: None   Collection Time: 07/12/20 11:40 AM   Specimen: Left Antecubital; Blood  Result Value Ref Range Status   Specimen Description   Final    LEFT ANTECUBITAL Performed at [facility], [place] [address], [place], [place] [address]    Special Requests   Final    BOTTLES DRAWN AEROBIC AND ANAEROBIC Blood Culture adequate volume Performed at [place] [address] [place], [place] [address]    Culture   Final    NO GROWTH 5 DAYS Performed at [facility], [place] [address], [place], [place] [address]    Report Status 07/17/2020 FINAL  Final  SARS Coronavirus 2 by RT PCR (hospital order,  performed in [facility] hospital lab) Nasopharyngeal Nasopharyngeal Swab     Status: None   Collection Time: 07/12/20 11:40 AM   Specimen: Nasopharyngeal Swab  Result Value Ref Range Status   SARS Coronavirus 2 NEGATIVE NEGATIVE Final    Comment: (NOTE) SARS-CoV-2 target nucleic acids are NOT DETECTED.  The SARS-CoV-2 RNA is generally detectable in upper and lower respiratory specimens during the acute phase of infection. The lowest concentration of SARS-CoV-2 viral copies this assay can detect is 250 copies / mL. A negative result does not preclude SARS-CoV-2 infection and should not be used as the sole basis for treatment or other patient management decisions.  A negative result may occur with improper specimen collection / handling, submission of specimen other than nasopharyngeal swab, presence of viral mutation(s) within the areas targeted by this assay, and inadequate number of viral copies (<250 copies / mL). A negative result must be combined with clinical observations, patient history, and epidemiological information.  Fact Sheet for Patients:   StrictlyIdeas.no  Fact  Sheet for Healthcare Providers: BankingDealers.co.za  This test is not yet approved or  cleared by the [place] and has been authorized for detection and/or diagnosis of SARS-CoV-2 by FDA under an Emergency Use Authorization (EUA).  This EUA will remain in effect (meaning this test can be used) for the duration of the COVID-19 declaration under Section 564(b)(1) of the Act, 21 U.S.C. section 360bbb-3(b)(1), unless the authorization is terminated or revoked sooner.  Performed at [facility], [place] [address] [place], [place] [address]   Culture, blood (routine x 2)     Status: None (Preliminary result)   Collection Time: 07/19/20  1:21 PM   Specimen: BLOOD  Result Value Ref Range Status   Specimen Description   Final    BLOOD  PICC LINE Performed at [facility] [address] [place], [place] [address]    Special Requests   Final    BOTTLES DRAWN AEROBIC AND ANAEROBIC Blood Culture adequate volume Performed at [facility] [address] [place], [place] [address]    Culture  Setup Time   Final    GRAM NEGATIVE RODS IN BOTH AEROBIC AND ANAEROBIC BOTTLES Organism ID to follow CRITICAL RESULT CALLED TO, READ BACK BY AND VERIFIED WITH: Seleta Rhymes PharmD 9:10 07/20/20 ([email or web address])    Culture   Final    CULTURE REINCUBATED FOR BETTER GROWTH STENOTROPHOMONAS MALTOPHILIA IDENTIFICATION AND SUSCEPTIBILITIES TO FOLLOW Performed at [facility], [place] [address], [place], [place] [address]    Report Status PENDING  Incomplete  Blood Culture ID Panel (Reflexed)     Status: Abnormal   Collection Time: 07/19/20  1:21 PM  Result Value Ref Range Status   Enterococcus faecalis NOT DETECTED NOT DETECTED Final   Enterococcus Faecium NOT DETECTED NOT DETECTED Final   Listeria monocytogenes NOT DETECTED NOT DETECTED Final   Staphylococcus species NOT DETECTED NOT DETECTED Final   Staphylococcus aureus (BCID) NOT DETECTED NOT DETECTED Final   Staphylococcus epidermidis NOT DETECTED NOT DETECTED Final   Staphylococcus lugdunensis NOT DETECTED NOT DETECTED Final   Streptococcus species NOT DETECTED NOT DETECTED Final   Streptococcus agalactiae NOT DETECTED NOT DETECTED Final   Streptococcus pneumoniae NOT DETECTED NOT DETECTED Final   Streptococcus pyogenes NOT DETECTED NOT DETECTED Final   A.calcoaceticus-baumannii NOT DETECTED NOT DETECTED Final   Bacteroides fragilis NOT DETECTED NOT DETECTED Final   Enterobacterales DETECTED (A) NOT DETECTED Final    Comment: Enterobacterales represent a large order of gram negative bacteria, not a single organism. CRITICAL RESULT CALLED TO, READ BACK BY AND VERIFIED WITH: Seleta Rhymes PharmD 9:10 07/20/20 ([email or web address])    Enterobacter cloacae  complex DETECTED (A) NOT DETECTED Final    Comment: CRITICAL RESULT CALLED TO, READ BACK BY AND VERIFIED WITH: Seleta Rhymes PharmD 9:10 07/20/20 ([email or web address])    Escherichia coli NOT DETECTED NOT DETECTED Final   Klebsiella aerogenes NOT DETECTED NOT DETECTED Final   Klebsiella oxytoca NOT DETECTED NOT DETECTED Final   Klebsiella pneumoniae NOT DETECTED NOT DETECTED Final   Proteus species NOT DETECTED NOT DETECTED Final   Salmonella species NOT DETECTED NOT DETECTED Final   Serratia marcescens NOT DETECTED NOT DETECTED Final   Haemophilus influenzae NOT DETECTED NOT DETECTED Final   Neisseria meningitidis NOT DETECTED NOT DETECTED Final   Pseudomonas aeruginosa NOT DETECTED NOT DETECTED Final   Stenotrophomonas maltophilia DETECTED (A) NOT DETECTED Final    Comment: CRITICAL RESULT CALLED TO, READ BACK BY AND VERIFIED WITH: Seleta Rhymes  PharmD 9:10 07/20/20 ([email or web address])    Candida albicans NOT DETECTED NOT DETECTED Final   Candida auris NOT DETECTED NOT DETECTED Final   Candida glabrata NOT DETECTED NOT DETECTED Final   Candida krusei NOT DETECTED NOT DETECTED Final   Candida parapsilosis NOT DETECTED NOT DETECTED Final   Candida tropicalis NOT DETECTED NOT DETECTED Final   Cryptococcus neoformans/gattii NOT DETECTED NOT DETECTED Final   CTX-M ESBL NOT DETECTED NOT DETECTED Final   Carbapenem resistance IMP NOT DETECTED NOT DETECTED Final   Carbapenem resistance KPC NOT DETECTED NOT DETECTED Final   Carbapenem resistance NDM NOT DETECTED NOT DETECTED Final   Carbapenem resist OXA 48 LIKE NOT DETECTED NOT DETECTED Final   Carbapenem resistance VIM NOT DETECTED NOT DETECTED Final    Comment: Performed at [facility], [place] [address] [place], [place] [address]  Culture, blood (routine x 2)     Status: None (Preliminary result)   Collection Time: 07/19/20  1:26 PM   Specimen: BLOOD RIGHT FOREARM  Result Value Ref Range Status   Specimen Description   Final    BLOOD RIGHT  FOREARM Performed at [facility] [address] [place], [place] [address]    Special Requests   Final    BOTTLES DRAWN AEROBIC AND ANAEROBIC Blood Culture adequate volume Performed at [facility] [address], [place], [place] [address]    Culture  Setup Time   Final    GRAM NEGATIVE RODS IN BOTH AEROBIC AND ANAEROBIC BOTTLES CRITICAL VALUE NOTED.  VALUE IS CONSISTENT WITH PREVIOUSLY REPORTED AND CALLED VALUE.    Culture   Final    GRAM NEGATIVE RODS IDENTIFICATION TO FOLLOW Performed at [facility], [place] [address], [place], [place] [address]    Report Status PENDING  Incomplete  SARS Coronavirus 2 by RT PCR (hospital order, performed in [facility] hospital lab) Nasopharyngeal Nasopharyngeal Swab     Status: None   Collection Time: 07/19/20  1:32 PM   Specimen: Nasopharyngeal Swab  Result Value Ref Range Status   SARS Coronavirus 2 NEGATIVE NEGATIVE Final    Comment: (NOTE) SARS-CoV-2 target nucleic acids are NOT DETECTED.  The SARS-CoV-2 RNA is generally detectable in upper and lower respiratory specimens during the acute phase of infection. The lowest concentration of SARS-CoV-2 viral copies this assay can detect is 250 copies / mL. A negative result does not preclude SARS-CoV-2 infection and should not be used as the sole basis for treatment or other patient management decisions.  A negative result may occur with improper specimen collection / handling, submission of specimen other than nasopharyngeal swab, presence of viral mutation(s) within the areas targeted by this assay, and inadequate number of viral copies (<250 copies / mL). A negative result must be combined with clinical observations, patient history, and epidemiological information.  Fact Sheet for Patients:   StrictlyIdeas.no  Fact Sheet for Healthcare Providers: BankingDealers.co.za  This test is not yet  approved or  cleared by the [place] FDA and has been authorized for detection and/or diagnosis of SARS-CoV-2 by FDA under an Emergency Use Authorization (EUA).  This EUA will remain in effect (meaning this test can be used) for the duration of the COVID-19 declaration under Section 564(b)(1) of the Act, 21 U.S.C. section 360bbb-3(b)(1), unless the authorization is terminated or revoked sooner.  Performed at [facility], [place] [address], [place], [place] [address]       Radiology Studies: CT ABDOMEN PELVIS  WO CONTRAST  Result Date: 07/19/2020 CLINICAL DATA:  Abdominal pain and fevers EXAM: CT ABDOMEN AND PELVIS WITHOUT CONTRAST TECHNIQUE: Multidetector CT imaging of the abdomen and pelvis was performed following the standard protocol without IV contrast. COMPARISON:  06/27/2020 FINDINGS: Lower chest: No acute abnormality. Hepatobiliary: Pneumobilia is again identified. Gallbladder has been surgically removed. The liver is otherwise unremarkable. Pancreas: Stable prominence of the pancreatic duct is seen. The remainder of the pancreas is within normal limits. Spleen: Normal in size without focal abnormality. Adrenals/Urinary Tract: Adrenal glands are unremarkable. Kidneys demonstrate no renal calculi or urinary tract obstructive changes. Bladder is well distended. Stomach/Bowel: No obstructive or inflammatory changes of the colon are seen. Changes of prior colon resection are noted with patent anastomosis in the mid abdomen. No small bowel or gastric abnormality is seen. Vascular/Lymphatic: Mild atherosclerotic calcifications are noted. Left femoral central venous line extending into the IVC is noted. Reproductive: Status post hysterectomy. No adnexal masses. Other: No abdominal wall hernia or abnormality. No abdominopelvic ascites. Musculoskeletal: Stable sclerotic focus adjacent to the left acetabulum is noted. Degenerative changes of the lumbar spine are seen. IMPRESSION:  No significant interval change from the prior exam of 06/27/2020. Chronic changes are noted. No acute abnormality to correspond with the given clinical history is noted. Electronically Signed   By: Inez Catalina M.D.   On: 07/19/2020 17:45   IR Fluoro Guide CV Line Left  Result Date: 07/20/2020 INDICATION: 59 year old with history of short gut syndrome and Crohn's disease. Patient is on chronic TPN and has limited venous access. Patient has a left femoral tunneled central venous catheter. Request for catheter exchange due to bacteremia. EXAM: TUNNEL CENTRAL VENOUS CATHETER EXCHANGE WITH FLUOROSCOPY Physician: Stephan Minister. Anselm Pancoast, MD MEDICATIONS: None ANESTHESIA/SEDATION: Versed 2.0 mg IV; Fentanyl 100 mcg IV; Moderate Sedation Time:  18 minutes The patient was continuously monitored during the procedure by the interventional radiology nurse under my direct supervision. FLUOROSCOPY TIME:  Fluoroscopy Time: 1 minute, 6 seconds, 5 mGy COMPLICATIONS: None immediate. PROCEDURE: The procedure was explained to the patient. The risks and benefits of the procedure were discussed and the patient's questions were addressed. Informed consent was obtained from the patient. Left groin was prepped and draped in sterile fashion. Maximal barrier sterile technique was utilized including caps, mask, sterile gowns, sterile gloves, sterile drape, hand hygiene and skin antiseptic. Skin around the catheter was anesthetized with 1% lidocaine. The catheter cuff was exposed. The existing catheter was removed over a platinum plus wire. Dual lumen Powerline catheter was cut to a similar length as the prior catheter. Catheter was cut at 42 cm. Catheter was advanced over the wire and positioned in the IVC. Both lumens aspirated and flushed well. Both catheters were capped and clamped. Catheter was sutured to skin and a dressing was placed. Fluoroscopic images were taken and saved for this procedure. FINDINGS: Catheter tip in the IVC. IMPRESSION:  Successful exchange of the left femoral tunneled central venous catheter with fluoroscopic guidance. Electronically Signed   By: Markus Daft M.D.   On: 07/20/2020 17:58   DG Chest Port 1 View  Result Date: 07/19/2020 CLINICAL DATA:  Sepsis, fever EXAM: PORTABLE CHEST 1 VIEW COMPARISON:  06/27/2020 FINDINGS: The heart size and mediastinal contours are stable. No focal airspace consolidation, pleural effusion, or pneumothorax. The visualized skeletal structures are unremarkable. IMPRESSION: No active disease. Electronically Signed   By: Davina Poke D.O.   On: 07/19/2020 14:40   US Abdomen Limited RUQ  Result Date: 07/19/2020  CLINICAL DATA:  Elevated LFTs. EXAM: ULTRASOUND ABDOMEN LIMITED RIGHT UPPER QUADRANT COMPARISON:  CT 06/27/2020.  Abdominal ultrasound 05/06/2020 FINDINGS: Gallbladder: Surgically absent. Common bile duct: Diameter: 4 mm, normal. Liver: No focal lesion identified. Mildly heterogeneous in parenchymal echogenicity. Mild central intrahepatic biliary ductal dilatation. Pneumobilia on prior CT is not well demonstrated. Portal vein is patent on color Doppler imaging with normal direction of blood flow towards the liver. Other: No visualized ascites. IMPRESSION: 1. Mild central intrahepatic biliary ductal dilatation, of doubtful clinical significance. Common bile duct is normal at 4 mm. Pneumobilia on prior CT is not well demonstrated sonographically. 2. Mild heterogeneity of the hepatic parenchyma without focal lesion. 3. Cholecystectomy. Electronically Signed   By: Keith Rake M.D.   On: 07/19/2020 16:48     LOS: 2 days   Antonieta Pert, MD [facility]  07/21/2020, 2:27 PM

## 2020-07-21 NOTE — Plan of Care (Signed)

## 2020-07-21 NOTE — Progress Notes (Signed)
    [facility] for Infectious Disease   Reason for visit: Follow up on bacteremia  Interval History: had line changed by IR yesterday.    Physical Exam: Constitutional:  Vitals:   07/21/20 1346 07/21/20 1346  BP: 131/78 131/78  Pulse: 68 68  Resp: 17 17  Temp: 97.8 F (36.6 C) 97.8 F (36.6 C)  SpO2: 99% 99%    Impression: bacteremia. Waiting for sensitivities of the organism to determine treatment and if IV or oral.  Likely will know tomorrow.   Plan: 1.  Continue with bactrim.  2. Continue to monitor creat

## 2020-07-22 DIAGNOSIS — A4159 Other Gram-negative sepsis: Secondary | ICD-10-CM

## 2020-07-22 LAB — BASIC METABOLIC PANEL
Anion gap: 8 (ref 5–15)
BUN: 14 mg/dL (ref 6–20)
CO2: 20 mmol/L — ABNORMAL LOW (ref 22–32)
Calcium: 9.1 mg/dL (ref 8.9–10.3)
Chloride: 110 mmol/L (ref 98–111)
Creatinine, Ser: 1.82 mg/dL — ABNORMAL HIGH (ref 0.44–1.00)
GFR calc Af Amer: 35 mL/min — ABNORMAL LOW (ref >60)
GFR calc non Af Amer: 30 mL/min — ABNORMAL LOW (ref >60)
Glucose, Bld: 82 mg/dL (ref 70–99)
Potassium: 4.4 mmol/L (ref 3.5–5.1)
Sodium: 138 mmol/L (ref 135–145)

## 2020-07-22 LAB — CBC
HCT: 22.5 % — ABNORMAL LOW (ref 36.0–46.0)
Hemoglobin: 7.3 g/dL — ABNORMAL LOW (ref 12.0–15.0)
MCH: 30.5 pg (ref 26.0–34.0)
MCHC: 32.4 g/dL (ref 30.0–36.0)
MCV: 94.1 fL (ref 80.0–100.0)
Platelets: 231 10*3/uL (ref 150–400)
RBC: 2.39 MIL/uL — ABNORMAL LOW (ref 3.87–5.11)
RDW: 14.2 % (ref 11.5–15.5)
WBC: 4.3 10*3/uL (ref 4.0–10.5)
nRBC: 0 % (ref 0.0–0.2)

## 2020-07-22 LAB — FOLATE: Folate: 22.6 ng/mL (ref >5.9)

## 2020-07-22 LAB — RETICULOCYTES
Immature Retic Fract: 20.7 % — ABNORMAL HIGH (ref 2.3–15.9)
RBC.: 2.62 MIL/uL — ABNORMAL LOW (ref 3.87–5.11)
Retic Count, Absolute: 56.3 10*3/uL (ref 19.0–186.0)
Retic Ct Pct: 2.2 % (ref 0.4–3.1)

## 2020-07-22 LAB — VITAMIN B1: Vitamin B1 (Thiamine): 232.4 nmol/L — ABNORMAL HIGH (ref 66.5–200.0)

## 2020-07-22 LAB — IRON AND TIBC
Iron: 100 ug/dL (ref 28–170)
Saturation Ratios: 41 % — ABNORMAL HIGH (ref 10.4–31.8)
TIBC: 245 ug/dL — ABNORMAL LOW (ref 250–450)
UIBC: 145 ug/dL

## 2020-07-22 LAB — FERRITIN: Ferritin: 129 ng/mL (ref 11–307)

## 2020-07-22 LAB — VITAMIN B12: Vitamin B-12: 2531 pg/mL — ABNORMAL HIGH (ref 180–914)

## 2020-07-22 NOTE — Progress Notes (Signed)
PROGRESS NOTE    Caitlin Peters  OVZ:[id number] DOB: [birth year] DOA: 07/19/2020 PCP: [name], [name], MD    Brief Narrative:  59 y.o. female with history of Crohn's disease, pancreatitis, short gut syndrome, TPN dependence, CKD-3B, chronic diarrhea, HTN, malnutrition, anxiety, depression, tobacco use and recent hospitalization from 8/2-8/7 with fever of unknown origin presenting with altered mental status.  Patient is awake and alert but confused. She is only oriented to self. She tells me she had a fever, generalized body pain. She also felt weak and could not get up a function right. Per patient's daughter, patient had intermittent "fever" ranging from 99-100 since she left the hospital. She has chronic nausea and diarrhea unchanged from baseline.. However, she felt confused and altered this morning that prompted her to call EMS. She denies shortness of breath, UTI symptoms, facial droop, slurring or focal weakness. She is TPN dependent although she takes some p.o. denies sick contact.   Per daughter, patient smokes about 1/4 pack a day. Denies drinking alcohol or recreational drug use.  In ED, febrile to 102. Otherwise vital signs stable. WBC 12.2 with left shift. Hgb 8.4 (9.5 on 8/17). Cr 2.24 (baseline about 1.6). BUN 40. AST/ALT 245/332 (86/265 on 8/17). ALP 646. Ammonia 55. Lactic acid 1.1. TSH 0.297. CXR and CT abdomen and pelvis without acute finding. Urinalysis pending. Blood cultures obtained. Patient was started on broad-spectrum antibiotics and IV fluid. Hospitalist service was called for admission for possible sepsis. Patient was admitted  Gram-negative bacteremia 2 out of 4 blood culture with GNR Enterobacter and stenotrophomonas.  Seen by ID antibiotics adjusted, left PICC line was changed as she has no other access.  And she is on long-term TPN.  Subjective: Alert awake oriented resting comfortably no new complaint.  No fever. Assessment & Plan:  Enterobacter and  stenotrophomonas SEPSIS POA: In the setting of long-term PICC line in the left thigh.  PICC line seen by IR.  CT revealed no acute finding.  Appreciate ID input continue IV Bactrim, pending culture sensitivity for changing to p.o. antibiotics  Acute metabolic encephalopathy from above it has resolved.  Elevated LFTs:? Etiology, ? TPN.  LFTs improving nicely. CT abd/pelvis  no acute finding. Right upper quadrant ultrasound CBD a 4 mm cholecystectomy status no other acute finding mild central intrahepatic biliary duct dilatation of doubtful clinical significance. Recent Labs  Lab 07/19/20 1332 07/20/20 0331 07/21/20 0906  AST 245* 118* 45*  ALT 332* 213* 150*  ALKPHOS 646* 446* 415*  BILITOT 2.4* 1.7* 0.5  PROT 8.0 6.2* 7.0  ALBUMIN 3.3* 2.5* 2.8*  INR  --  1.1  --    AKI on CKD stage IIIb/azotemia likely prerenal: Renal function improving. cont  Hydration, baseline creatinine around 1.6-1.9.  Renal fun femains at baseline. Recent Labs  Lab 07/19/20 1332 07/20/20 0331 07/21/20 0906 07/22/20 0346  BUN 39* 28* 18 14  CREATININE 2.24* 1.78* 1.70* 1.82*   Anemia of chronic disease hemoglobin trending 79 g.  Patient reports she has had blood transfusion previously for hemoglobin less than 7 g.  Continue iron supplementation will need CBC check in 5 to 7 days by PCP and steroids agreed to do this.   Crohn's disease Short gut syndrome/TPN dependent: On budesonide p.o. daily. TPN on hold.  Resume upon discharge.  Hypertension: BP controlled on her home amlodipine, hydralazine, metoprolol.  Anxiety/depression: Mood is stable continue her home medication  Chronic pain syndrome on methadone at home.  Continue methadone, requesting IV  Dilaudid while here, and nausea medication Phenergan  Leukocytosis bandemia resolved   DVT prophylaxis: enoxaparin (LOVENOX) injection 30 mg Start: 07/19/20 2200 Code Status:   Code Status: Full Code  Family Communication: plan of care discussed with  patient at bedside.  Status is: Inpatient  Remains inpatient appropriate because:IV treatments appropriate due to intensity of illness or inability to take PO and Inpatient level of care appropriate due to severity of illness   Dispo: The patient is from: Home              Anticipated d/c is to: Home              Anticipated d/c date is: Once culture sensitivities back and okay with ID and tolerating p.o. adequately stable              Patient currently is not medically stable to d/c.  Diet Order            Diet Heart Room service appropriate? Yes; Fluid consistency: Thin  Diet effective now                 Body mass index is 21.94 kg/m.  Consultants:see note  Procedures:see note Microbiology:see note Blood Culture    Component Value Date/Time   SDES  07/20/2020 1500    URINE, CLEAN CATCH Performed at [facility], [place] [address], [place], [place] [address]    SPECREQUEST  07/20/2020 1500    NONE Performed at [facility], [place] [address] [place], [place] [address]    CULT (A) 07/20/2020 1500    <10,000 COLONIES/mL INSIGNIFICANT GROWTH Performed at [facility] [address] [place], [place] [address]    REPTSTATUS 07/21/2020 FINAL 07/20/2020 1500    Other culture-see note  Medications: Scheduled Meds: . acidophilus  1 capsule Oral Daily  . amLODipine  10 mg Oral Daily  . budesonide  9 mg Oral Daily  . buPROPion  200 mg Oral Daily  . Chlorhexidine Gluconate Cloth  6 each Topical Daily  . cholecalciferol  1,000 Units Oral Daily  . cycloSPORINE  1 drop Both Eyes BID  . dicyclomine  20 mg Oral TID  . DULoxetine  90 mg Oral Daily  . enoxaparin (LOVENOX) injection  30 mg Subcutaneous Q24H  . estradiol  2 mg Oral Daily  . hydrALAZINE  50 mg Oral TID  . methadone  5 mg Oral 5 X Daily  . metoprolol tartrate  100 mg Oral BID  . multivitamin with minerals  1 tablet Oral Daily  . pantoprazole  40 mg Oral Daily  .  polyvinyl alcohol  1 drop Both Eyes QID  . potassium chloride SA  40 mEq Oral Daily  . vitamin B-12  1,000 mcg Oral Daily   Continuous Infusions: . lactated ringers 50 mL/hr at 07/22/20 1036  . sulfamethoxazole-trimethoprim 320 mg (07/22/20 1143)    Antimicrobials: Anti-infectives (From admission, onward)   Start     Dose/Rate Route Frequency Ordered Stop   07/20/20 1400  ceFEPIme (MAXIPIME) 2 g in sodium chloride 0.9 % 100 mL IVPB  Status:  Discontinued        2 g 200 mL/hr over 30 Minutes Intravenous Every 24 hours 07/19/20 2025 07/20/20 0857   07/20/20 1400  sulfamethoxazole-trimethoprim (BACTRIM) 320 mg in dextrose 5 % 500 mL IVPB        320 mg 346.7 mL/hr over 90 Minutes Intravenous Every 12 hours 07/20/20 1322  07/20/20 1000  ceFEPIme (MAXIPIME) 2 g in sodium chloride 0.9 % 100 mL IVPB  Status:  Discontinued        2 g 200 mL/hr over 30 Minutes Intravenous Every 12 hours 07/20/20 0857 07/20/20 0941   07/20/20 1000  levofloxacin (LEVAQUIN) IVPB 750 mg  Status:  Discontinued        750 mg 100 mL/hr over 90 Minutes Intravenous Every 48 hours 07/20/20 0949 07/20/20 1322   07/19/20 2200  metroNIDAZOLE (FLAGYL) IVPB 500 mg  Status:  Discontinued        500 mg 100 mL/hr over 60 Minutes Intravenous Every 8 hours 07/19/20 1714 07/20/20 0941   07/19/20 2024  vancomycin variable dose per unstable renal function (pharmacist dosing)  Status:  Discontinued         Does not apply See admin instructions 07/19/20 2025 07/20/20 0941   07/19/20 1430  vancomycin (VANCOREADY) IVPB 1500 mg/300 mL        1,500 mg 150 mL/hr over 120 Minutes Intravenous  Once 07/19/20 1344 07/19/20 1744   07/19/20 1345  aztreonam (AZACTAM) 2 g in sodium chloride 0.9 % 100 mL IVPB  Status:  Discontinued        2 g 200 mL/hr over 30 Minutes Intravenous  Once 07/19/20 1333 07/19/20 1344   07/19/20 1345  metroNIDAZOLE (FLAGYL) IVPB 500 mg        500 mg 100 mL/hr over 60 Minutes Intravenous  Once 07/19/20 1333  07/19/20 1722   07/19/20 1345  vancomycin (VANCOCIN) IVPB 1000 mg/200 mL premix        1,000 mg 200 mL/hr over 60 Minutes Intravenous  Once 07/19/20 1333 07/19/20 1744   07/19/20 1345  ceFEPIme (MAXIPIME) 2 g in sodium chloride 0.9 % 100 mL IVPB        2 g 200 mL/hr over 30 Minutes Intravenous  Once 07/19/20 1342 07/19/20 1722       Objective: Vitals: Today's Vitals   07/22/20 0558 07/22/20 0930 07/22/20 1034 07/22/20 1301  BP: (!) 146/62   134/60  Pulse: (!) 108   (!) 54  Resp: 15   16  Temp: 98.3 F (36.8 C)   98.5 F (36.9 C)  TempSrc: Oral   Oral  SpO2: 100%   100%  Weight:      Height:      PainSc:  9  7      Intake/Output Summary (Last 24 hours) at 07/22/2020 1343 Last data filed at 07/22/2020 1301 Gross per 24 hour  Intake 3090.59 ml  Output 1800 ml  Net 1290.59 ml   Filed Weights   07/19/20 2107  Weight: 65.5 kg   Weight change:    Intake/Output from previous day: 08/26 0701 - 08/27 0700 In: 3531.6 [I.V.:1911.8; IV Piggyback:1619.8] Out: 2500 [Urine:2500] Intake/Output this shift: Total I/O In: 360 [P.O.:360] Out: -   Examination:  General exam: AAO X3, NAD, weak appearing. HEENT:Oral mucosa moist, Ear/Nose WNL grossly, dentition normal. Respiratory system: bilaterally clear,no wheezing or crackles,no use of accessory muscle Cardiovascular system: S1 & S2 +, No JVD,. Gastrointestinal system: Abdomen soft, NT,ND, BS+ Nervous System:Alert, awake, moving extremities and grossly nonfocal Extremities: No edema, distal peripheral pulses palpable.  Skin: No rashes,no icterus. MSK: Normal muscle bulk,tone, power  Left thigh with PICC line intact no drainage erythema tenderness  Left high w/ PICC LINE  Data Reviewed: I have personally reviewed following labs and imaging studies CBC: Recent Labs  Lab 07/19/20 1332 07/20/20 0331 07/21/20 0906 07/22/20  0346  WBC 12.2* 8.6 5.9 4.3  NEUTROABS 10.5*  --   --   --   HGB 8.4* 7.3* 7.9* 7.3*  HCT 26.3*  22.4* 24.2* 22.5*  MCV 92.6 93.7 93.4 94.1  PLT 209 164 220 629   Basic Metabolic Panel: Recent Labs  Lab 07/19/20 1332 07/20/20 0331 07/21/20 0906 07/22/20 0346  NA 135 136 136 138  K 4.4 4.1 4.3 4.4  CL 96* 104 106 110  CO2 24 23 22  20*  GLUCOSE 90 90 118* 82  BUN 39* 28* 18 14  CREATININE 2.24* 1.78* 1.70* 1.82*  CALCIUM 9.7 9.1 9.3 9.1  MG  --  2.2  --   --   PHOS  --  3.0  --   --    GFR: Estimated Creatinine Clearance: 33.6 mL/min (A) (by C-G formula based on SCr of 1.82 mg/dL (H)). Liver Function Tests: Recent Labs  Lab 07/19/20 1332 07/20/20 0331 07/21/20 0906  AST 245* 118* 45*  ALT 332* 213* 150*  ALKPHOS 646* 446* 415*  BILITOT 2.4* 1.7* 0.5  PROT 8.0 6.2* 7.0  ALBUMIN 3.3* 2.5* 2.8*   No results for input(s): LIPASE, AMYLASE in the last 168 hours. Recent Labs  Lab 07/19/20 1332 07/20/20 0331  AMMONIA 55* 29   Coagulation Profile: Recent Labs  Lab 07/20/20 0331  INR 1.1   Cardiac Enzymes: No results for input(s): CKTOTAL, CKMB, CKMBINDEX, TROPONINI in the last 168 hours. BNP (last 3 results) No results for input(s): PROBNP in the last 8760 hours. HbA1C: No results for input(s): HGBA1C in the last 72 hours. CBG: No results for input(s): GLUCAP in the last 168 hours. Lipid Profile: No results for input(s): CHOL, HDL, LDLCALC, TRIG, CHOLHDL, LDLDIRECT in the last 72 hours. Thyroid Function Tests: No results for input(s): TSH, T4TOTAL, FREET4, T3FREE, THYROIDAB in the last 72 hours. Anemia Panel: Recent Labs    07/22/20 0900  VITAMINB12 2,531*  FOLATE 22.6  FERRITIN 129  TIBC 245*  IRON 100  RETICCTPCT 2.2   Sepsis Labs: Recent Labs  Lab 07/19/20 1332  LATICACIDVEN 1.1    Recent Results (from the past 240 hour(s))  Culture, blood (routine x 2)     Status: None (Preliminary result)   Collection Time: 07/19/20  1:21 PM   Specimen: BLOOD  Result Value Ref Range Status   Specimen Description   Final    BLOOD PICC LINE Performed  at [facility], [place] [address] [place], [place] [address]    Special Requests   Final    BOTTLES DRAWN AEROBIC AND ANAEROBIC Blood Culture adequate volume Performed at [facility] [address], [place], [place] [address]    Culture  Setup Time   Final    GRAM NEGATIVE RODS IN BOTH AEROBIC AND ANAEROBIC BOTTLES Organism ID to follow CRITICAL RESULT CALLED TO, READ BACK BY AND VERIFIED WITH: Seleta Rhymes PharmD 9:10 07/20/20 ([email or web address])    Culture   Final    CULTURE REINCUBATED FOR BETTER GROWTH STENOTROPHOMONAS MALTOPHILIA IDENTIFICATION AND SUSCEPTIBILITIES TO FOLLOW Performed at [facility], [place] [address] [place], [place] [address]    Report Status PENDING  Incomplete  Blood Culture ID Panel (Reflexed)     Status: Abnormal   Collection Time: 07/19/20  1:21 PM  Result Value Ref Range Status   Enterococcus faecalis NOT DETECTED NOT DETECTED Final   Enterococcus Faecium NOT DETECTED NOT DETECTED Final   Listeria monocytogenes NOT DETECTED NOT DETECTED Final  Staphylococcus species NOT DETECTED NOT DETECTED Final   Staphylococcus aureus (BCID) NOT DETECTED NOT DETECTED Final   Staphylococcus epidermidis NOT DETECTED NOT DETECTED Final   Staphylococcus lugdunensis NOT DETECTED NOT DETECTED Final   Streptococcus species NOT DETECTED NOT DETECTED Final   Streptococcus agalactiae NOT DETECTED NOT DETECTED Final   Streptococcus pneumoniae NOT DETECTED NOT DETECTED Final   Streptococcus pyogenes NOT DETECTED NOT DETECTED Final   A.calcoaceticus-baumannii NOT DETECTED NOT DETECTED Final   Bacteroides fragilis NOT DETECTED NOT DETECTED Final   Enterobacterales DETECTED (A) NOT DETECTED Final    Comment: Enterobacterales represent a large order of gram negative bacteria, not a single organism. CRITICAL RESULT CALLED TO, READ BACK BY AND VERIFIED WITH: Seleta Rhymes PharmD 9:10 07/20/20 ([email or web address])    Enterobacter cloacae complex DETECTED (A) NOT  DETECTED Final    Comment: CRITICAL RESULT CALLED TO, READ BACK BY AND VERIFIED WITH: Seleta Rhymes PharmD 9:10 07/20/20 ([email or web address])    Escherichia coli NOT DETECTED NOT DETECTED Final   Klebsiella aerogenes NOT DETECTED NOT DETECTED Final   Klebsiella oxytoca NOT DETECTED NOT DETECTED Final   Klebsiella pneumoniae NOT DETECTED NOT DETECTED Final   Proteus species NOT DETECTED NOT DETECTED Final   Salmonella species NOT DETECTED NOT DETECTED Final   Serratia marcescens NOT DETECTED NOT DETECTED Final   Haemophilus influenzae NOT DETECTED NOT DETECTED Final   Neisseria meningitidis NOT DETECTED NOT DETECTED Final   Pseudomonas aeruginosa NOT DETECTED NOT DETECTED Final   Stenotrophomonas maltophilia DETECTED (A) NOT DETECTED Final    Comment: CRITICAL RESULT CALLED TO, READ BACK BY AND VERIFIED WITH: Seleta Rhymes PharmD 9:10 07/20/20 ([email or web address])    Candida albicans NOT DETECTED NOT DETECTED Final   Candida auris NOT DETECTED NOT DETECTED Final   Candida glabrata NOT DETECTED NOT DETECTED Final   Candida krusei NOT DETECTED NOT DETECTED Final   Candida parapsilosis NOT DETECTED NOT DETECTED Final   Candida tropicalis NOT DETECTED NOT DETECTED Final   Cryptococcus neoformans/gattii NOT DETECTED NOT DETECTED Final   CTX-M ESBL NOT DETECTED NOT DETECTED Final   Carbapenem resistance IMP NOT DETECTED NOT DETECTED Final   Carbapenem resistance KPC NOT DETECTED NOT DETECTED Final   Carbapenem resistance NDM NOT DETECTED NOT DETECTED Final   Carbapenem resist OXA 48 LIKE NOT DETECTED NOT DETECTED Final   Carbapenem resistance VIM NOT DETECTED NOT DETECTED Final    Comment: Performed at [facility], [address] [place], [place] [address]  Culture, blood (routine x 2)     Status: None (Preliminary result)   Collection Time: 07/19/20  1:26 PM   Specimen: BLOOD RIGHT FOREARM  Result Value Ref Range Status   Specimen Description   Final    BLOOD RIGHT FOREARM Performed at [facility] [address], [place], [place] [address]    Special Requests   Final    BOTTLES DRAWN AEROBIC AND ANAEROBIC Blood Culture adequate volume Performed at [facility] [address] [place], [place] [address]    Culture  Setup Time   Final    GRAM NEGATIVE RODS IN BOTH AEROBIC AND ANAEROBIC BOTTLES CRITICAL VALUE NOTED.  VALUE IS CONSISTENT WITH PREVIOUSLY REPORTED AND CALLED VALUE.    Culture   Final    GRAM NEGATIVE RODS IDENTIFICATION TO FOLLOW Performed at [facility], [place] [address] [place],  [address]    Report Status PENDING  Incomplete  SARS Coronavirus 2 by RT PCR (hospital order, performed in  [facility] hospital lab) Nasopharyngeal Nasopharyngeal Swab     Status: None   Collection Time: 07/19/20  1:32 PM   Specimen: Nasopharyngeal Swab  Result Value Ref Range Status   SARS Coronavirus 2 NEGATIVE NEGATIVE Final    Comment: (NOTE) SARS-CoV-2 target nucleic acids are NOT DETECTED.  The SARS-CoV-2 RNA is generally detectable in upper and lower respiratory specimens during the acute phase of infection. The lowest concentration of SARS-CoV-2 viral copies this assay can detect is 250 copies / mL. A negative result does not preclude SARS-CoV-2 infection and should not be used as the sole basis for treatment or other patient management decisions.  A negative result may occur with improper specimen collection / handling, submission of specimen other than nasopharyngeal swab, presence of viral mutation(s) within the areas targeted by this assay, and inadequate number of viral copies (<250 copies / mL). A negative result must be combined with clinical observations, patient history, and epidemiological information.  Fact Sheet for Patients:   StrictlyIdeas.no  Fact Sheet for Healthcare Providers: BankingDealers.co.za  This test is not yet approved or  cleared by the [place] FDA and has been authorized for detection and/or diagnosis of SARS-CoV-2 by FDA under an Emergency Use Authorization (EUA).  This EUA will remain in effect (meaning this test can be used) for the duration of the COVID-19 declaration under Section 564(b)(1) of the Act, 21 U.S.C. section 360bbb-3(b)(1), unless the authorization is terminated or revoked sooner.  Performed at [facility], [place] [address], [place], [place] [address]   Urine culture     Status: Abnormal   Collection Time: 07/20/20  3:00 PM   Specimen: Urine, Clean Catch  Result Value Ref Range Status   Specimen Description   Final    URINE, CLEAN CATCH Performed at [facility], [place] [address] [place], [place] [address]    Special Requests   Final    NONE Performed at [facility], [place] [address], [place], [place] [address]    Culture (A)  Final    <10,000 COLONIES/mL INSIGNIFICANT GROWTH Performed at [facility] [address], [place], [place] [address]    Report Status 07/21/2020 FINAL  Final      Radiology Studies: IR Fluoro Guide CV Line Left  Result Date: 07/20/2020 INDICATION: 59 year old with history of short gut syndrome and Crohn's disease. Patient is on chronic TPN and has limited venous access. Patient has a left femoral tunneled central venous catheter. Request for catheter exchange due to bacteremia. EXAM: TUNNEL CENTRAL VENOUS CATHETER EXCHANGE WITH FLUOROSCOPY Physician: Stephan Minister. Anselm Pancoast, MD MEDICATIONS: None ANESTHESIA/SEDATION: Versed 2.0 mg IV; Fentanyl 100 mcg IV; Moderate Sedation Time:  18 minutes The patient was continuously monitored during the procedure by the interventional radiology nurse under my direct supervision. FLUOROSCOPY TIME:  Fluoroscopy Time: 1 minute, 6 seconds, 5 mGy COMPLICATIONS: None immediate. PROCEDURE: The procedure was explained to the patient. The risks and benefits of the procedure were discussed and the  patient's questions were addressed. Informed consent was obtained from the patient. Left groin was prepped and draped in sterile fashion. Maximal barrier sterile technique was utilized including caps, mask, sterile gowns, sterile gloves, sterile drape, hand hygiene and skin antiseptic. Skin around the catheter was anesthetized with 1% lidocaine. The catheter cuff was exposed. The existing catheter was removed over a platinum plus wire. Dual lumen Powerline catheter was cut to a similar length as the prior catheter. Catheter was cut at  42 cm. Catheter was advanced over the wire and positioned in the IVC. Both lumens aspirated and flushed well. Both catheters were capped and clamped. Catheter was sutured to skin and a dressing was placed. Fluoroscopic images were taken and saved for this procedure. FINDINGS: Catheter tip in the IVC. IMPRESSION: Successful exchange of the left femoral tunneled central venous catheter with fluoroscopic guidance. Electronically Signed   By: Markus Daft M.D.   On: 07/20/2020 17:58     LOS: 3 days   Antonieta Pert, MD [facility]  07/22/2020, 1:43 PM

## 2020-07-22 NOTE — Progress Notes (Signed)
    [facility] for Infectious Disease   Reason for visit: Follow up on bacteremia  Interval History: no fever,  WBC remains wnl  Physical Exam: Constitutional:  Vitals:   07/22/20 0558 07/22/20 1301  BP: (!) 146/62 134/60  Pulse: (!) 108 (!) 54  Resp: 15 16  Temp: 98.3 F (36.8 C) 98.5 F (36.9 C)  SpO2: 100% 100%    Impression: Bacteremia.  Still waiting for culture ID and sensitivities.  Stenotrophomonas on BCID.  She has been on levaquin and Bactrim recently so prefer to wait for sensitivities.  If Bactrim is sensitive, she can go out on Bactrim orally through 9/7 She has follow up with Dr. Tommy Medal in October  Plan: 1. Continue with Bactrim 2. Continue to monitor creat.   Outpatient BMP 2-3 days after discharge  Dr. Baxter Flattery will monitor blood culture results over the weekend.

## 2020-07-23 LAB — BASIC METABOLIC PANEL
Anion gap: 10 (ref 5–15)
BUN: 15 mg/dL (ref 6–20)
CO2: 21 mmol/L — ABNORMAL LOW (ref 22–32)
Calcium: 9.3 mg/dL (ref 8.9–10.3)
Chloride: 107 mmol/L (ref 98–111)
Creatinine, Ser: 1.78 mg/dL — ABNORMAL HIGH (ref 0.44–1.00)
GFR calc Af Amer: 36 mL/min — ABNORMAL LOW (ref >60)
GFR calc non Af Amer: 31 mL/min — ABNORMAL LOW (ref >60)
Glucose, Bld: 84 mg/dL (ref 70–99)
Potassium: 4.3 mmol/L (ref 3.5–5.1)
Sodium: 138 mmol/L (ref 135–145)

## 2020-07-23 LAB — CULTURE, BLOOD (ROUTINE X 2)
Special Requests: ADEQUATE
Special Requests: ADEQUATE

## 2020-07-23 LAB — CBC
HCT: 23.2 % — ABNORMAL LOW (ref 36.0–46.0)
Hemoglobin: 7.5 g/dL — ABNORMAL LOW (ref 12.0–15.0)
MCH: 30.2 pg (ref 26.0–34.0)
MCHC: 32.3 g/dL (ref 30.0–36.0)
MCV: 93.5 fL (ref 80.0–100.0)
Platelets: 215 10*3/uL (ref 150–400)
RBC: 2.48 MIL/uL — ABNORMAL LOW (ref 3.87–5.11)
RDW: 14.2 % (ref 11.5–15.5)
WBC: 4.1 10*3/uL (ref 4.0–10.5)
nRBC: 0 % (ref 0.0–0.2)

## 2020-07-23 MED ORDER — HEPARIN SOD (PORK) LOCK FLUSH 100 UNIT/ML IV SOLN
250.0000 [IU] | INTRAVENOUS | Status: AC | PRN
  Administered 2020-07-23: 250 [IU]
  Filled 2020-07-23: qty 2.5

## 2020-07-23 MED ORDER — SULFAMETHOXAZOLE-TRIMETHOPRIM 800-160 MG PO TABS: 1.0000 | ORAL_TABLET | Freq: Two times a day (BID) | ORAL | 0 refills | Status: AC

## 2020-07-23 NOTE — Progress Notes (Signed)
Pt has made progress with pain. Now reporting baseline pain levels of chronic pain. She is on ABX therapy now been switched to oral antibiotics. She is alert/oriented x4. VS are stable. She understands all instructions at discharge and will be met downstairs by her daughter. All Meds to be due until 1800 have been given. Pt ready for d/c

## 2020-07-23 NOTE — Discharge Summary (Signed)
Physician Discharge Summary  Caitlin Peters KJZ:[id number] DOB: 11/25/1961 DOA: 07/19/2020  PCP: [name], [name], MD  Admit date: 07/19/2020 Discharge date: 07/25/2020  Admitted From: home Disposition:  home  Recommendations for Outpatient Follow-up:  Follow up with PCP in 1-2 weeks Please obtain BMP/CBC in one week Please follow up on the following pending results:  Home Health:no  Equipment/Devices: none  Discharge Condition: Stable Code Status:   Code Status: Prior Diet recommendation:  Diet Order             Diet - low sodium heart healthy                    Brief/Interim Summary: 59YoF with hx of  chronic diarrhea,HTN,Malnutrition,anxiety,depression,tobacco use and recent hospitalization from 8/2-8/7 with fever of unknown origin presenting with altered mental status. Patient is awake and alert but confused.She is only oriented to self. She tells me she had a fever, generalized body pain. She also felt weak and could not get up a function right. Per patient's daughter, patient had intermittent "fever" ranging from 99-100 since she left the hospital. She has chronic nausea and diarrhea unchanged from baseline.However,she felt confused and altered this morning that prompted her to call EMS. She denies shortness of breath, UTI symptoms, facial droop, slurring or focal weakness.She is TPN dependent although she takes some p.o. denies sick contact.  Per daughter, patient smokes about 1/4 pack a day. Denies drinking alcohol or recreational drug use. In ED, febrile to 102. Otherwise vital signs stable. WBC 12.2 with left shift. Hgb 8.4 (9.5 on 8/17). Cr 2.24 (baseline about 1.6). BUN 40. AST/ALT 245/332 (86/265 on 8/17). ALP 646. Ammonia 55. Lactic acid 1.1. TSH 0.297. CXR and CT abdomen and pelvis without acute finding. Urinalysis pending. Blood cultures obtained. Patient was started on broad-spectrum antibiotics and IV fluid. Hospitalist service was called for admission  for possible sepsis. Patient was admitted  Gram-negative bacteremia 2 out of 4 blood culture with GNR Enterobacter and stenotrophomonas.  Seen by ID antibiotics adjusted, left PICC line was changed as she has no other access. she is on long-term TPN. Patient clinically improved.  Seen by ID based on sensitivity advised discharge on oral Bactrim.  She'll follow up with PCP.  Discharge Diagnoses:   Enterobacter and stenotrophomonas SEPSIS POA: In the setting of long-term PICC line in the left thigh.  PICC line seen by IR.  CT revealed no acute finding.  Appreciate ID input continue Bactrim as ordered to complete the course.  Acute metabolic encephalopathy from above it has resolved.   Elevated LFTs:? Etiology, ? TPN.  LFTs improving. CT abd/pelvis  no acute finding. Right upper quadrant ultrasound CBD a 4 mm cholecystectomy status no other acute finding mild central intrahepatic biliary duct dilatation of doubtful clinical significance  AKI on CKD stage IIIb/azotemia likely prerenal: AKI stable now with renal function overall at baseline.  She will need BMP monitoring and potassium monitoring especially as she is on Bactrim next 3 to 4 days. patient instructed.  baseline creatinine around 1.6-1.9.  Renal fun femains at baseline.  Anemia of chronic disease hemoglobin trending 79 g.  Patient reports she has had blood transfusion previously for hemoglobin less than 7 g.  Continue iron supplementation will need CBC check in 5 to 7 days by PCP and discussed with the patient, patient has verbalized understanding.     Crohn's disease Short gut syndrome/TPN dependent: On budesonide p.o. daily. TPN on hold.  Resume upon  discharge.   Hypertension: BP controlled on her home amlodipine, hydralazine, metoprolol.   Anxiety/depression: Mood is stable continue her home medication   Chronic pain syndrome on methadone at home.  Continue methadone, requesting IV Dilaudid while here, and nausea medication Phenergan    Leukocytosis bandemia resolved   Consults: ID  Subjective: Doing well pain controlled tolerating diet.  Anxious to go home was waiting for few days ,was waiting on culture sensitivity and finally back  Discharge Exam: Vitals:   07/23/20 0645 07/23/20 1312  BP: (!) 136/56 (!) 151/65  Pulse: (!) 53 (!) 105  Resp: 18 16  Temp: 98.4 F (36.9 C) 98.6 F (37 C)  SpO2: 100% 100%   General: Pt is alert, awake, not in acute distress Cardiovascular: RRR, S1/S2 +, no rubs, no gallops Respiratory: CTA bilaterally, no wheezing, no rhonchi Abdominal: Soft, NT, ND, bowel sounds + Extremities: no edema, no cyanosis  Discharge Instructions  Discharge Instructions     Diet - low sodium heart healthy   Complete by: As directed    Discharge instructions   Complete by: As directed    Please check CBC in 1 wk Please check BMP in 2 to 3 days from PCP while on antibiotics.  Please call call MD or return to ER for similar or worsening recurring problem that brought you to hospital or if any fever,nausea/vomiting,abdominal pain, uncontrolled pain, chest pain,  shortness of breath or any other alarming symptoms.  Please follow-up your doctor as instructed in a week time and call the office for appointment.  Please avoid alcohol, smoking, or any other illicit substance and maintain healthy habits including taking your regular medications as prescribed.  You were cared for by a hospitalist during your hospital stay. If you have any questions about your discharge medications or the care you received while you were in the hospital after you are discharged, you can call the unit and ask to speak with the hospitalist on call if the hospitalist that took care of you is not available.  Once you are discharged, your primary care physician will handle any further medical issues. Please note that NO REFILLS for any discharge medications will be authorized once you are discharged, as it is imperative that  you return to your primary care physician (or establish a relationship with a primary care physician if you do not have one) for your aftercare needs so that they can reassess your need for medications and monitor your lab values.   Increase activity slowly   Complete by: As directed    No wound care   Complete by: As directed       Allergies as of 07/23/2020       Reactions   Cefepime Other (See Comments)   Neurotoxicity occurring in setting of AKI. Ceftriaxone tolerated during same admit   Gabapentin Other (See Comments)   unknown   Hyoscyamine Hives, Swelling   Legs swelling Disorientation   Lyrica [pregabalin] Other (See Comments)   unknown   Meperidine Hives   Other reaction(s): GI Upset Due to Chrones   Topamax [topiramate] Other (See Comments)   unknown   Fentanyl Rash   Pt is allergic to fentanyl patch related to the glue (gives her a rash) Pt states she is NOT allergic to fentanyl IV medicine   Morphine And Related Rash        Medication List     STOP taking these medications    potassium chloride SA 20 MEQ tablet Commonly  known as: KLOR-CON       TAKE these medications    acetaminophen 325 MG tablet Commonly known as: TYLENOL Take 650 mg by mouth every 6 (six) hours as needed for mild pain.   amLODipine 10 MG tablet Commonly known as: NORVASC Take 1 tablet (10 mg total) by mouth daily.   budesonide 3 MG 24 hr capsule Commonly known as: ENTOCORT EC TAKE 3 CAPSULES BY MOUTH ONCE DAILY What changed: how much to take   buPROPion 100 MG 12 hr tablet Commonly known as: WELLBUTRIN SR Take 2 tablets by mouth once daily   calcium carbonate 500 MG chewable tablet Commonly known as: TUMS - dosed in mg elemental calcium Chew 1 tablet by mouth 3 (three) times daily as needed for indigestion or heartburn.   cholecalciferol 25 MCG (1000 UNIT) tablet Commonly known as: VITAMIN D3 Take 1,000 Units by mouth daily.   cycloSPORINE 0.05 % ophthalmic  emulsion Commonly known as: RESTASIS Place 1 drop into both eyes 2 (two) times daily.   Dexilant 60 MG capsule Generic drug: dexlansoprazole Take 1 capsule (60 mg total) by mouth daily.   Dextran 70-Hypromellose 0.1-0.3 % Soln Place 1 drop into both eyes 4 (four) times daily.   dicyclomine 20 MG tablet Commonly known as: BENTYL Take 20 mg by mouth 3 (three) times daily.   diphenoxylate-atropine 2.5-0.025 MG tablet Commonly known as: LOMOTIL Take 1 tablet by mouth 4 (four) times daily as needed for diarrhea or loose stools.   DULoxetine 30 MG capsule Commonly known as: CYMBALTA Take 3 capsules (90 mg total) by mouth daily.   estradiol 2 MG tablet Commonly known as: ESTRACE Take 1 tablet (2 mg total) by mouth daily.   famotidine 20 MG tablet Commonly known as: PEPCID Take 20 mg by mouth at bedtime.   hydrALAZINE 50 MG tablet Commonly known as: APRESOLINE Take 1 tablet (50 mg total) by mouth 3 (three) times daily.   HYDROmorphone 4 MG tablet Commonly known as: Dilaudid Take 1 tablet (4 mg total) by mouth every 6 (six) hours as needed for severe pain.   lipase/protease/amylase 36000 UNITS Cpep capsule Commonly known as: Creon Take 1 capsule (36,000 Units total) by mouth 3 (three) times daily as needed (with meals for digestion).   loperamide 2 MG capsule Commonly known as: IMODIUM TAKE 1 CAPSULE BY MOUTH AS NEEDED FOR DIARRHEA OR&nbsp;&nbsp;LOOSE&nbsp;&nbsp;STOOLS What changed:  how much to take how to take this when to take this reasons to take this additional instructions   methadone 5 MG tablet Commonly known as: DOLOPHINE Take 1 tablet (5 mg total) by mouth 5 (five) times daily.   metoprolol tartrate 100 MG tablet Commonly known as: LOPRESSOR Take 1 tablet (100 mg total) by mouth 2 (two) times daily.   MULTIVITAMIN ADULT PO Take 1 tablet by mouth daily.   Omega-3 1000 MG Caps Take 1,000 mg by mouth daily.   PRESCRIPTION MEDICATION Inject 1 each into  the vein daily. Home TPN . [facility] . 1 bag for 16 hours. [phone]   PROBIOTIC-10 PO Take 1 capsule by mouth daily.   promethazine 25 MG tablet Commonly known as: PHENERGAN Take 1 tablet (25 mg total) by mouth every 6 (six) hours as needed for nausea or vomiting. TAKE 1 TABLET BY MOUTH EVERY 6 HOURS AS NEEDED FOR NAUSEA AND VOMITING What changed: additional instructions   sucralfate 1 GM/10ML suspension Commonly known as: Carafate Take 10 mLs (1 g total) by  mouth 4 (four) times daily -  with meals and at bedtime.   sulfamethoxazole-trimethoprim 800-160 MG tablet Commonly known as: BACTRIM DS Take 1 tablet by mouth 2 (two) times daily for 10 days.   traZODone 100 MG tablet Commonly known as: DESYREL Take 2 tablets (200 mg total) by mouth at bedtime as needed for sleep.   vitamin B-12 100 MCG tablet Commonly known as: CYANOCOBALAMIN Take 1,000 mcg by mouth daily.        Follow-up Information     [name], [name], MD Follow up.   Specialty: Internal Medicine Why: cbc check in 1 wk Contact information: [address] [place] [address] [phone]                Allergies  Allergen Reactions   Cefepime Other (See Comments)    Neurotoxicity occurring in setting of AKI. Ceftriaxone tolerated during same admit   Gabapentin Other (See Comments)    unknown   Hyoscyamine Hives and Swelling    Legs swelling   Disorientation   Lyrica [Pregabalin] Other (See Comments)    unknown   Meperidine Hives    Other reaction(s): GI Upset Due to Chrones    Topamax [Topiramate] Other (See Comments)    unknown   Fentanyl Rash    Pt is allergic to fentanyl patch related to the glue (gives her a rash) Pt states she is NOT allergic to fentanyl IV medicine   Morphine And Related Rash    The results of significant diagnostics from this hospitalization (including imaging, microbiology, ancillary and laboratory) are  listed below for reference.    Microbiology: Recent Results (from the past 240 hour(s))  Culture, blood (routine x 2)     Status: Abnormal   Collection Time: 07/19/20  1:21 PM   Specimen: BLOOD  Result Value Ref Range Status   Specimen Description   Final    BLOOD PICC LINE Performed at [facility], [place] [address], [place], [place] [address]    Special Requests   Final    BOTTLES DRAWN AEROBIC AND ANAEROBIC Blood Culture adequate volume Performed at [facility] [address] [place], [place] [address]    Culture  Setup Time   Final    GRAM NEGATIVE RODS IN BOTH AEROBIC AND ANAEROBIC BOTTLES Organism ID to follow CRITICAL RESULT CALLED TO, READ BACK BY AND VERIFIED WITH: Seleta Rhymes PharmD 9:10 07/20/20 ([email or web address]) Performed at [facility], [place] [address], [place], [place] [address]    Culture (A)  Final    ENTEROBACTER CLOACAE STENOTROPHOMONAS MALTOPHILIA    Report Status 07/23/2020 FINAL  Final   Organism ID, Bacteria ENTEROBACTER CLOACAE  Final   Organism ID, Bacteria STENOTROPHOMONAS MALTOPHILIA  Final      Susceptibility   Enterobacter cloacae - MIC*    CEFAZOLIN >=64 RESISTANT Resistant     CEFEPIME <=0.12 SENSITIVE Sensitive     CEFTAZIDIME <=1 SENSITIVE Sensitive     CIPROFLOXACIN <=0.25 SENSITIVE Sensitive     GENTAMICIN <=1 SENSITIVE Sensitive     IMIPENEM 0.5 SENSITIVE Sensitive     TRIMETH/SULFA <=20 SENSITIVE Sensitive     PIP/TAZO <=4 SENSITIVE Sensitive     * ENTEROBACTER CLOACAE   Stenotrophomonas maltophilia - MIC*    LEVOFLOXACIN 0.5 SENSITIVE Sensitive     TRIMETH/SULFA <=20 SENSITIVE Sensitive     * STENOTROPHOMONAS MALTOPHILIA  Blood Culture ID Panel (Reflexed)     Status: Abnormal   Collection Time: 07/19/20  1:21 PM  Result Value Ref Range Status   Enterococcus faecalis NOT DETECTED NOT DETECTED Final   Enterococcus Faecium NOT DETECTED NOT DETECTED Final   Listeria monocytogenes NOT DETECTED NOT  DETECTED Final   Staphylococcus species NOT DETECTED NOT DETECTED Final   Staphylococcus aureus (BCID) NOT DETECTED NOT DETECTED Final   Staphylococcus epidermidis NOT DETECTED NOT DETECTED Final   Staphylococcus lugdunensis NOT DETECTED NOT DETECTED Final   Streptococcus species NOT DETECTED NOT DETECTED Final   Streptococcus agalactiae NOT DETECTED NOT DETECTED Final   Streptococcus pneumoniae NOT DETECTED NOT DETECTED Final   Streptococcus pyogenes NOT DETECTED NOT DETECTED Final   A.calcoaceticus-baumannii NOT DETECTED NOT DETECTED Final   Bacteroides fragilis NOT DETECTED NOT DETECTED Final   Enterobacterales DETECTED (A) NOT DETECTED Final    Comment: Enterobacterales represent a large order of gram negative bacteria, not a single organism. CRITICAL RESULT CALLED TO, READ BACK BY AND VERIFIED WITH: Seleta Rhymes PharmD 9:10 07/20/20 ([email or web address])    Enterobacter cloacae complex DETECTED (A) NOT DETECTED Final    Comment: CRITICAL RESULT CALLED TO, READ BACK BY AND VERIFIED WITH: Seleta Rhymes PharmD 9:10 07/20/20 ([email or web address])    Escherichia coli NOT DETECTED NOT DETECTED Final   Klebsiella aerogenes NOT DETECTED NOT DETECTED Final   Klebsiella oxytoca NOT DETECTED NOT DETECTED Final   Klebsiella pneumoniae NOT DETECTED NOT DETECTED Final   Proteus species NOT DETECTED NOT DETECTED Final   Salmonella species NOT DETECTED NOT DETECTED Final   Serratia marcescens NOT DETECTED NOT DETECTED Final   Haemophilus influenzae NOT DETECTED NOT DETECTED Final   Neisseria meningitidis NOT DETECTED NOT DETECTED Final   Pseudomonas aeruginosa NOT DETECTED NOT DETECTED Final   Stenotrophomonas maltophilia DETECTED (A) NOT DETECTED Final    Comment: CRITICAL RESULT CALLED TO, READ BACK BY AND VERIFIED WITH: Seleta Rhymes PharmD 9:10 07/20/20 ([email or web address])    Candida albicans NOT DETECTED NOT DETECTED Final   Candida auris NOT DETECTED NOT DETECTED Final   Candida glabrata NOT DETECTED NOT DETECTED Final   Candida  krusei NOT DETECTED NOT DETECTED Final   Candida parapsilosis NOT DETECTED NOT DETECTED Final   Candida tropicalis NOT DETECTED NOT DETECTED Final   Cryptococcus neoformans/gattii NOT DETECTED NOT DETECTED Final   CTX-M ESBL NOT DETECTED NOT DETECTED Final   Carbapenem resistance IMP NOT DETECTED NOT DETECTED Final   Carbapenem resistance KPC NOT DETECTED NOT DETECTED Final   Carbapenem resistance NDM NOT DETECTED NOT DETECTED Final   Carbapenem resist OXA 48 LIKE NOT DETECTED NOT DETECTED Final   Carbapenem resistance VIM NOT DETECTED NOT DETECTED Final    Comment: Performed at [facility], [address], [place], [place] [address]  Culture, blood (routine x 2)     Status: Abnormal   Collection Time: 07/19/20  1:26 PM   Specimen: BLOOD RIGHT FOREARM  Result Value Ref Range Status   Specimen Description   Final    BLOOD RIGHT FOREARM Performed at [facility] [address], [place], [place] [address]    Special Requests   Final    BOTTLES DRAWN AEROBIC AND ANAEROBIC Blood Culture adequate volume Performed at [facility] [address] [place], [place] [address]    Culture  Setup Time   Final    GRAM NEGATIVE RODS IN BOTH AEROBIC AND ANAEROBIC BOTTLES CRITICAL VALUE NOTED.  VALUE IS CONSISTENT WITH PREVIOUSLY REPORTED AND CALLED VALUE.    Culture (A)  Final    ENTEROBACTER CLOACAE SUSCEPTIBILITIES  PERFORMED ON PREVIOUS CULTURE WITHIN THE LAST 5 DAYS. Performed at [facility], [place] [address], [place], [place] [address]    Report Status 07/23/2020 FINAL  Final  SARS Coronavirus 2 by RT PCR (hospital order, performed in [facility] hospital lab) Nasopharyngeal Nasopharyngeal Swab     Status: None   Collection Time: 07/19/20  1:32 PM   Specimen: Nasopharyngeal Swab  Result Value Ref Range Status   SARS Coronavirus 2 NEGATIVE NEGATIVE Final    Comment: (NOTE) SARS-CoV-2 target nucleic acids are NOT DETECTED.  The SARS-CoV-2  RNA is generally detectable in upper and lower respiratory specimens during the acute phase of infection. The lowest concentration of SARS-CoV-2 viral copies this assay can detect is 250 copies / mL. A negative result does not preclude SARS-CoV-2 infection and should not be used as the sole basis for treatment or other patient management decisions.  A negative result may occur with improper specimen collection / handling, submission of specimen other than nasopharyngeal swab, presence of viral mutation(s) within the areas targeted by this assay, and inadequate number of viral copies (<250 copies / mL). A negative result must be combined with clinical observations, patient history, and epidemiological information.  Fact Sheet for Patients:   StrictlyIdeas.no  Fact Sheet for Healthcare Providers: BankingDealers.co.za  This test is not yet approved or  cleared by the [place] FDA and has been authorized for detection and/or diagnosis of SARS-CoV-2 by FDA under an Emergency Use Authorization (EUA).  This EUA will remain in effect (meaning this test can be used) for the duration of the COVID-19 declaration under Section 564(b)(1) of the Act, 21 U.S.C. section 360bbb-3(b)(1), unless the authorization is terminated or revoked sooner.  Performed at [facility], [place] [address], [place], [place] [address]   Urine culture     Status: Abnormal   Collection Time: 07/20/20  3:00 PM   Specimen: Urine, Clean Catch  Result Value Ref Range Status   Specimen Description   Final    URINE, CLEAN CATCH Performed at [facility], [place] [address] [place], [place] [address]    Special Requests   Final    NONE Performed at [facility], [place] [address] [place], [place] [address]    Culture (A)  Final    <10,000 COLONIES/mL INSIGNIFICANT GROWTH Performed at [facility] [address], [place], [place] [address]    Report Status 07/21/2020 FINAL  Final    Procedures/Studies: CT ABDOMEN PELVIS WO CONTRAST  Result Date: 07/19/2020 CLINICAL DATA:  Abdominal pain and fevers EXAM: CT ABDOMEN AND PELVIS WITHOUT CONTRAST TECHNIQUE: Multidetector CT imaging of the abdomen and pelvis was performed following the standard protocol without IV contrast. COMPARISON:  06/27/2020 FINDINGS: Lower chest: No acute abnormality. Hepatobiliary: Pneumobilia is again identified. Gallbladder has been surgically removed. The liver is otherwise unremarkable. Pancreas: Stable prominence of the pancreatic duct is seen. The remainder of the pancreas is within normal limits. Spleen: Normal in size without focal abnormality. Adrenals/Urinary Tract: Adrenal glands are unremarkable. Kidneys demonstrate no renal calculi or urinary tract obstructive changes. Bladder is well distended. Stomach/Bowel: No obstructive or inflammatory changes of the colon are seen. Changes of prior colon resection are noted with patent anastomosis in the mid abdomen. No small bowel or gastric abnormality is seen. Vascular/Lymphatic: Mild atherosclerotic calcifications are noted. Left femoral central venous line extending into the IVC is noted. Reproductive: Status post hysterectomy. No adnexal masses. Other: No abdominal  wall hernia or abnormality. No abdominopelvic ascites. Musculoskeletal: Stable sclerotic focus adjacent to the left acetabulum is noted. Degenerative changes of the lumbar spine are seen. IMPRESSION: No significant interval change from the prior exam of 06/27/2020. Chronic changes are noted. No acute abnormality to correspond with the given clinical history is noted. Electronically Signed   By: Inez Catalina M.D.   On: 07/19/2020 17:45   CT ABDOMEN PELVIS WO CONTRAST  Result Date: 06/27/2020 CLINICAL DATA:  Fever x1 day with abdominal pain. EXAM: CT ABDOMEN AND PELVIS WITHOUT CONTRAST TECHNIQUE: Multidetector CT imaging of the  abdomen and pelvis was performed following the standard protocol without IV contrast. COMPARISON:  May 05, 2020 FINDINGS: Lower chest: No acute abnormality. Hepatobiliary: No focal liver abnormality is seen. Status post cholecystectomy. Moderate severity pneumobilia is noted. This is seen on the prior study. Pancreas: There is stable dilatation of the pancreatic duct. The pancreatic parenchyma is normal in appearance. Spleen: Normal in size without focal abnormality. Adrenals/Urinary Tract: Adrenal glands are unremarkable. Kidneys are normal, without renal calculi, focal lesion, or hydronephrosis. Bladder is unremarkable. Stomach/Bowel: Stomach is within normal limits. The appendix is not identified. Surgically anastomosed bowel is seen within the mid and lower abdomen. No evidence of bowel dilatation. Vascular/Lymphatic: No significant vascular findings are present. The venous catheter is seen entering via the left groin. This is unchanged in position when compared to the prior exam. No enlarged abdominal or pelvic lymph nodes. Reproductive: Status post hysterectomy. No adnexal masses. Other: No abdominal wall hernia or abnormality. No abdominopelvic ascites. Musculoskeletal: A stable 1.6 cm x 0.8 cm sclerotic focus is seen adjacent to the anterolateral aspect of the left acetabulum. No acute osseous abnormalities are identified. IMPRESSION: 1. Evidence of prior cholecystectomy. 2. Stable dilatation of the pancreatic duct. 3. Stable sclerotic focus adjacent to the anterolateral aspect of the left acetabulum. This may represent a bone island. Electronically Signed   By: Virgina Norfolk M.D.   On: 06/27/2020 20:23   DG Cervical Spine Complete  Result Date: 07/08/2020 CLINICAL DATA:  Radiculopathy EXAM: CERVICAL SPINE - COMPLETE 4+ VIEW COMPARISON:  None. FINDINGS: The cervical spine is visualized from C1-superior endplate of T1. Cervical alignment is maintained. Vertebral body heights are maintained: no  evidence of acute fracture. Mild intervertebral disc space height loss at C6-7 with LEFT-sided moderate osseous neuroforaminal narrowing. Uncovertebral hypertrophy of the lower cervical spine. No prevertebral soft tissue swelling. Visualized thorax is unremarkable. IMPRESSION: Mild degenerative disc disease at C6-7 with LEFT-sided moderate osseous neuroforaminal narrowing. Electronically Signed   By: Valentino Saxon MD   On: 07/08/2020 09:00   IR Fluoro Guide CV Line Left  Result Date: 07/20/2020 INDICATION: 59 year old with history of short gut syndrome and Crohn's disease. Patient is on chronic TPN and has limited venous access. Patient has a left femoral tunneled central venous catheter. Request for catheter exchange due to bacteremia. EXAM: TUNNEL CENTRAL VENOUS CATHETER EXCHANGE WITH FLUOROSCOPY Physician: Stephan Minister. Anselm Pancoast, MD MEDICATIONS: None ANESTHESIA/SEDATION: Versed 2.0 mg IV; Fentanyl 100 mcg IV; Moderate Sedation Time:  18 minutes The patient was continuously monitored during the procedure by the interventional radiology nurse under my direct supervision. FLUOROSCOPY TIME:  Fluoroscopy Time: 1 minute, 6 seconds, 5 mGy COMPLICATIONS: None immediate. PROCEDURE: The procedure was explained to the patient. The risks and benefits of the procedure were discussed and the patient's questions were addressed. Informed consent was obtained from the patient. Left groin was prepped and draped in sterile fashion. Maximal barrier sterile technique  was utilized including caps, mask, sterile gowns, sterile gloves, sterile drape, hand hygiene and skin antiseptic. Skin around the catheter was anesthetized with 1% lidocaine. The catheter cuff was exposed. The existing catheter was removed over a platinum plus wire. Dual lumen Powerline catheter was cut to a similar length as the prior catheter. Catheter was cut at 42 cm. Catheter was advanced over the wire and positioned in the IVC. Both lumens aspirated and flushed  well. Both catheters were capped and clamped. Catheter was sutured to skin and a dressing was placed. Fluoroscopic images were taken and saved for this procedure. FINDINGS: Catheter tip in the IVC. IMPRESSION: Successful exchange of the left femoral tunneled central venous catheter with fluoroscopic guidance. Electronically Signed   By: Markus Daft M.D.   On: 07/20/2020 17:58   DG Chest Port 1 View  Result Date: 07/19/2020 CLINICAL DATA:  Sepsis, fever EXAM: PORTABLE CHEST 1 VIEW COMPARISON:  06/27/2020 FINDINGS: The heart size and mediastinal contours are stable. No focal airspace consolidation, pleural effusion, or pneumothorax. The visualized skeletal structures are unremarkable. IMPRESSION: No active disease. Electronically Signed   By: Davina Poke D.O.   On: 07/19/2020 14:40   DG Chest Port 1 View  Result Date: 06/27/2020 CLINICAL DATA:  Fever.  Abdominal pain.  Headache. EXAM: PORTABLE CHEST 1 VIEW COMPARISON:  None. FINDINGS: Heart size is normal. Surgical clips project over the right hilum and chest. These are stable. No edema or effusion is present. No airspace disease is evident. The visualized soft tissues and bony thorax are unremarkable. IMPRESSION: No acute cardiopulmonary disease or significant interval change. Electronically Signed   By: San Morelle M.D.   On: 06/27/2020 20:16   US Abdomen Limited RUQ  Result Date: 07/19/2020 CLINICAL DATA:  Elevated LFTs. EXAM: ULTRASOUND ABDOMEN LIMITED RIGHT UPPER QUADRANT COMPARISON:  CT 06/27/2020.  Abdominal ultrasound 05/06/2020 FINDINGS: Gallbladder: Surgically absent. Common bile duct: Diameter: 4 mm, normal. Liver: No focal lesion identified. Mildly heterogeneous in parenchymal echogenicity. Mild central intrahepatic biliary ductal dilatation. Pneumobilia on prior CT is not well demonstrated. Portal vein is patent on color Doppler imaging with normal direction of blood flow towards the liver. Other: No visualized ascites. IMPRESSION:  1. Mild central intrahepatic biliary ductal dilatation, of doubtful clinical significance. Common bile duct is normal at 4 mm. Pneumobilia on prior CT is not well demonstrated sonographically. 2. Mild heterogeneity of the hepatic parenchyma without focal lesion. 3. Cholecystectomy. Electronically Signed   By: Keith Rake M.D.   On: 07/19/2020 16:48     Labs: BNP (last 3 results) Recent Labs    10/12/19 2059 10/28/19 0435 11/26/19 0209  BNP 253.9* 162.4* 329.5*   Basic Metabolic Panel: Recent Labs  Lab 07/19/20 1332 07/20/20 0331 07/21/20 0906 07/22/20 0346 07/23/20 0323  NA 135 136 136 138 138  K 4.4 4.1 4.3 4.4 4.3  CL 96* 104 106 110 107  CO2 24 23 22  20* 21*  GLUCOSE 90 90 118* 82 84  BUN 39* 28* 18 14 15   CREATININE 2.24* 1.78* 1.70* 1.82* 1.78*  CALCIUM 9.7 9.1 9.3 9.1 9.3  MG  --  2.2  --   --   --   PHOS  --  3.0  --   --   --    Liver Function Tests: Recent Labs  Lab 07/19/20 1332 07/20/20 0331 07/21/20 0906  AST 245* 118* 45*  ALT 332* 213* 150*  ALKPHOS 646* 446* 415*  BILITOT 2.4* 1.7* 0.5  PROT 8.0  6.2* 7.0  ALBUMIN 3.3* 2.5* 2.8*   No results for input(s): LIPASE, AMYLASE in the last 168 hours. Recent Labs  Lab 07/19/20 1332 07/20/20 0331  AMMONIA 55* 29   CBC: Recent Labs  Lab 07/19/20 1332 07/20/20 0331 07/21/20 0906 07/22/20 0346 07/23/20 0323  WBC 12.2* 8.6 5.9 4.3 4.1  NEUTROABS 10.5*  --   --   --   --   HGB 8.4* 7.3* 7.9* 7.3* 7.5*  HCT 26.3* 22.4* 24.2* 22.5* 23.2*  MCV 92.6 93.7 93.4 94.1 93.5  PLT 209 164 220 231 215   Cardiac Enzymes: No results for input(s): CKTOTAL, CKMB, CKMBINDEX, TROPONINI in the last 168 hours. BNP: Invalid input(s): POCBNP CBG: No results for input(s): GLUCAP in the last 168 hours. D-Dimer No results for input(s): DDIMER in the last 72 hours. Hgb A1c No results for input(s): HGBA1C in the last 72 hours. Lipid Profile No results for input(s): CHOL, HDL, LDLCALC, TRIG, CHOLHDL, LDLDIRECT in  the last 72 hours. Thyroid function studies No results for input(s): TSH, T4TOTAL, T3FREE, THYROIDAB in the last 72 hours.  Invalid input(s): FREET3 Anemia work up No results for input(s): VITAMINB12, FOLATE, FERRITIN, TIBC, IRON, RETICCTPCT in the last 72 hours.  Urinalysis    Component Value Date/Time   COLORURINE YELLOW 07/20/2020 1500   APPEARANCEUR CLEAR 07/20/2020 1500   LABSPEC 1.012 07/20/2020 1500   PHURINE 8.0 07/20/2020 1500   GLUCOSEU NEGATIVE 07/20/2020 1500   HGBUR NEGATIVE 07/20/2020 1500   BILIRUBINUR NEGATIVE 07/20/2020 1500   KETONESUR NEGATIVE 07/20/2020 1500   PROTEINUR 30 (A) 07/20/2020 1500   NITRITE NEGATIVE 07/20/2020 1500   LEUKOCYTESUR NEGATIVE 07/20/2020 1500   Sepsis Labs Invalid input(s): PROCALCITONIN,  WBC,  LACTICIDVEN Microbiology Recent Results (from the past 240 hour(s))  Culture, blood (routine x 2)     Status: Abnormal   Collection Time: 07/19/20  1:21 PM   Specimen: BLOOD  Result Value Ref Range Status   Specimen Description   Final    BLOOD PICC LINE Performed at [facility], [place] [address] [place], [place] [address]    Special Requests   Final    BOTTLES DRAWN AEROBIC AND ANAEROBIC Blood Culture adequate volume Performed at [place] [address], [place], [place] [address]    Culture  Setup Time   Final    GRAM NEGATIVE RODS IN BOTH AEROBIC AND ANAEROBIC BOTTLES Organism ID to follow CRITICAL RESULT CALLED TO, READ BACK BY AND VERIFIED WITH: Seleta Rhymes PharmD 9:10 07/20/20 ([email or web address]) Performed at [facility], [place] [address] [place],  [address]    Culture (A)  Final    ENTEROBACTER CLOACAE STENOTROPHOMONAS MALTOPHILIA    Report Status 07/23/2020 FINAL  Final   Organism ID, Bacteria ENTEROBACTER CLOACAE  Final   Organism ID, Bacteria STENOTROPHOMONAS MALTOPHILIA  Final      Susceptibility   Enterobacter cloacae - MIC*    CEFAZOLIN >=64 RESISTANT Resistant      CEFEPIME <=0.12 SENSITIVE Sensitive     CEFTAZIDIME <=1 SENSITIVE Sensitive     CIPROFLOXACIN <=0.25 SENSITIVE Sensitive     GENTAMICIN <=1 SENSITIVE Sensitive     IMIPENEM 0.5 SENSITIVE Sensitive     TRIMETH/SULFA <=20 SENSITIVE Sensitive     PIP/TAZO <=4 SENSITIVE Sensitive     * ENTEROBACTER CLOACAE   Stenotrophomonas maltophilia - MIC*    LEVOFLOXACIN 0.5 SENSITIVE Sensitive     TRIMETH/SULFA <=20 SENSITIVE Sensitive     * STENOTROPHOMONAS MALTOPHILIA  Blood Culture ID Panel (Reflexed)     Status: Abnormal   Collection Time: 07/19/20  1:21 PM  Result Value Ref Range Status   Enterococcus faecalis NOT DETECTED NOT DETECTED Final   Enterococcus Faecium NOT DETECTED NOT DETECTED Final   Listeria monocytogenes NOT DETECTED NOT DETECTED Final   Staphylococcus species NOT DETECTED NOT DETECTED Final   Staphylococcus aureus (BCID) NOT DETECTED NOT DETECTED Final   Staphylococcus epidermidis NOT DETECTED NOT DETECTED Final   Staphylococcus lugdunensis NOT DETECTED NOT DETECTED Final   Streptococcus species NOT DETECTED NOT DETECTED Final   Streptococcus agalactiae NOT DETECTED NOT DETECTED Final   Streptococcus pneumoniae NOT DETECTED NOT DETECTED Final   Streptococcus pyogenes NOT DETECTED NOT DETECTED Final   A.calcoaceticus-baumannii NOT DETECTED NOT DETECTED Final   Bacteroides fragilis NOT DETECTED NOT DETECTED Final   Enterobacterales DETECTED (A) NOT DETECTED Final    Comment: Enterobacterales represent a large order of gram negative bacteria, not a single organism. CRITICAL RESULT CALLED TO, READ BACK BY AND VERIFIED WITH: Seleta Rhymes PharmD 9:10 07/20/20 ([email or web address])    Enterobacter cloacae complex DETECTED (A) NOT DETECTED Final    Comment: CRITICAL RESULT CALLED TO, READ BACK BY AND VERIFIED WITH: Seleta Rhymes PharmD 9:10 07/20/20 ([email or web address])    Escherichia coli NOT DETECTED NOT DETECTED Final   Klebsiella aerogenes NOT DETECTED NOT DETECTED Final   Klebsiella oxytoca NOT DETECTED  NOT DETECTED Final   Klebsiella pneumoniae NOT DETECTED NOT DETECTED Final   Proteus species NOT DETECTED NOT DETECTED Final   Salmonella species NOT DETECTED NOT DETECTED Final   Serratia marcescens NOT DETECTED NOT DETECTED Final   Haemophilus influenzae NOT DETECTED NOT DETECTED Final   Neisseria meningitidis NOT DETECTED NOT DETECTED Final   Pseudomonas aeruginosa NOT DETECTED NOT DETECTED Final   Stenotrophomonas maltophilia DETECTED (A) NOT DETECTED Final    Comment: CRITICAL RESULT CALLED TO, READ BACK BY AND VERIFIED WITH: Seleta Rhymes PharmD 9:10 07/20/20 ([email or web address])    Candida albicans NOT DETECTED NOT DETECTED Final   Candida auris NOT DETECTED NOT DETECTED Final   Candida glabrata NOT DETECTED NOT DETECTED Final   Candida krusei NOT DETECTED NOT DETECTED Final   Candida parapsilosis NOT DETECTED NOT DETECTED Final   Candida tropicalis NOT DETECTED NOT DETECTED Final   Cryptococcus neoformans/gattii NOT DETECTED NOT DETECTED Final   CTX-M ESBL NOT DETECTED NOT DETECTED Final   Carbapenem resistance IMP NOT DETECTED NOT DETECTED Final   Carbapenem resistance KPC NOT DETECTED NOT DETECTED Final   Carbapenem resistance NDM NOT DETECTED NOT DETECTED Final   Carbapenem resist OXA 48 LIKE NOT DETECTED NOT DETECTED Final   Carbapenem resistance VIM NOT DETECTED NOT DETECTED Final    Comment: Performed at [facility], [address] [place], [place] [address]  Culture, blood (routine x 2)     Status: Abnormal   Collection Time: 07/19/20  1:26 PM   Specimen: BLOOD RIGHT FOREARM  Result Value Ref Range Status   Specimen Description   Final    BLOOD RIGHT FOREARM Performed at [facility] [address], [place], [place] [address]    Special Requests   Final    BOTTLES DRAWN AEROBIC AND ANAEROBIC Blood Culture adequate volume Performed at [facility] [address], [place], [place] [address]    Culture  Setup Time   Final    GRAM  NEGATIVE RODS IN BOTH AEROBIC AND ANAEROBIC BOTTLES CRITICAL VALUE NOTED.  VALUE IS CONSISTENT WITH PREVIOUSLY  REPORTED AND CALLED VALUE.    Culture (A)  Final    ENTEROBACTER CLOACAE SUSCEPTIBILITIES PERFORMED ON PREVIOUS CULTURE WITHIN THE LAST 5 DAYS. Performed at [facility], [place] [address] [place], [place] [address]    Report Status 07/23/2020 FINAL  Final  SARS Coronavirus 2 by RT PCR (hospital order, performed in [facility] hospital lab) Nasopharyngeal Nasopharyngeal Swab     Status: None   Collection Time: 07/19/20  1:32 PM   Specimen: Nasopharyngeal Swab  Result Value Ref Range Status   SARS Coronavirus 2 NEGATIVE NEGATIVE Final    Comment: (NOTE) SARS-CoV-2 target nucleic acids are NOT DETECTED.  The SARS-CoV-2 RNA is generally detectable in upper and lower respiratory specimens during the acute phase of infection. The lowest concentration of SARS-CoV-2 viral copies this assay can detect is 250 copies / mL. A negative result does not preclude SARS-CoV-2 infection and should not be used as the sole basis for treatment or other patient management decisions.  A negative result may occur with improper specimen collection / handling, submission of specimen other than nasopharyngeal swab, presence of viral mutation(s) within the areas targeted by this assay, and inadequate number of viral copies (<250 copies / mL). A negative result must be combined with clinical observations, patient history, and epidemiological information.  Fact Sheet for Patients:   StrictlyIdeas.no  Fact Sheet for Healthcare Providers: BankingDealers.co.za  This test is not yet approved or  cleared by the [place] FDA and has been authorized for detection and/or diagnosis of SARS-CoV-2 by FDA under an Emergency Use Authorization (EUA).  This EUA will remain in effect (meaning this test can be used) for the duration of the COVID-19 declaration  under Section 564(b)(1) of the Act, 21 U.S.C. section 360bbb-3(b)(1), unless the authorization is terminated or revoked sooner.  Performed at [facility], [place] [address], [place], [place] [address]   Urine culture     Status: Abnormal   Collection Time: 07/20/20  3:00 PM   Specimen: Urine, Clean Catch  Result Value Ref Range Status   Specimen Description   Final    URINE, CLEAN CATCH Performed at [facility], [place] [address], [place], [place] [address]    Special Requests   Final    NONE Performed at [facility], [place] [address] [place], [place] [address]    Culture (A)  Final    <10,000 COLONIES/mL INSIGNIFICANT GROWTH Performed at [place] [address] [place], [place] [address]    Report Status 07/21/2020 FINAL  Final     Time coordinating discharge: 25  minutes  SIGNED: Antonieta Pert, MD  [facility] 07/25/2020, 1:13 PM  If 7PM-7AM, please contact night-coverage [email or web address]

## 2020-07-23 NOTE — Plan of Care (Signed)

## 2020-07-25 ENCOUNTER — Telehealth: Payer: Self-pay | Admitting: Internal Medicine

## 2020-07-25 NOTE — Telephone Encounter (Signed)
There is no availability on your schedule until 08/02/2020 for a Hospital follow up. Patient is due for a BMP within 2-3 days of hospital discharge and a CBC within 1 week of hospitalization. Patient has to arrange for transportation and would like for this to all be done the same day. Could you please let us know when you can see patient

## 2020-07-25 NOTE — Telephone Encounter (Signed)
Transition Care Management Follow-up Telephone Call  Date of discharge and from where: 07/23/2020 from [facility]  How have you been since you were released from the hospital? Patient states that she has been doing a lot better.   Any questions or concerns? No  Items Reviewed:  Did the pt receive and understand the discharge instructions provided? Yes   Medications obtained and verified? Yes   Any new allergies since your discharge? No   Dietary orders reviewed? Yes  Do you have support at home? Yes , patient has daughter in the home to assist   Functional Questionnaire: (I = Independent and D = Dependent) ADLs: I  Bathing/Dressing- U  Meal Prep- I  Eating- I  Maintaining continence- I  Transferring/Ambulation- I  Managing Meds- I  Follow up appointments reviewed:   PCP Hospital f/u appt confirmed? No  PCP does not have any appointments available until 08/02/2020. Patient needs a BMP within 2-3 days and CBC within 1 week need to know where PCP would like her to be scheduled.  [facility] f/u appt confirmed? Yes  Scheduled to see Dr. Jaynee Eagles  on 09/13/2020 @ 11:00 am .  Are transportation arrangements needed? No   If their condition worsens, is the pt aware to call PCP or go to the Emergency Dept.? Yes  Was the patient provided with contact information for the PCP's office or ED? Yes  Was to pt encouraged to call back with questions or concerns? Yes

## 2020-07-27 NOTE — Telephone Encounter (Signed)
Anything sooner than 9/7?

## 2020-07-28 NOTE — Telephone Encounter (Signed)
Attempted to call patient but the mailbox is full.  Appointment schedule with Dr Jerilee Hoh.  Patient will need a lab appointment before her appointment.

## 2020-07-28 NOTE — Telephone Encounter (Signed)
Patient called back and is not able to make it here tomorrow for labs. She was wanting to know if it will be okay to have her labs done on the same day she comes to [facility] on 09/07.  Please advise

## 2020-07-28 NOTE — Telephone Encounter (Signed)
Left message on machine for patient to schedule a lab appointment for 07/29/20

## 2020-07-29 ENCOUNTER — Telehealth: Payer: Self-pay

## 2020-07-29 ENCOUNTER — Other Ambulatory Visit: Payer: Self-pay | Admitting: *Deleted

## 2020-07-29 DIAGNOSIS — M816 Localized osteoporosis [Lequesne]: Secondary | ICD-10-CM

## 2020-07-29 DIAGNOSIS — I10 Essential (primary) hypertension: Secondary | ICD-10-CM

## 2020-07-29 MED ORDER — LOPERAMIDE HCL 2 MG PO CAPS
ORAL_CAPSULE | ORAL | 0 refills | Status: DC
Start: 2020-07-29 — End: 2020-11-22

## 2020-07-29 MED ORDER — PROMETHAZINE HCL 25 MG PO TABS: 25.0000 mg | ORAL_TABLET | Freq: Four times a day (QID) | ORAL | 0 refills | Status: DC | PRN

## 2020-07-29 NOTE — Progress Notes (Signed)
Chronic Care Management Pharmacy Assistant   Name: Caitlin Peters  MRN: [id number] DOB: 02-19-1961  Reason for Encounter:Hypertension Medication Review Call   PCP : [name], [name], MD  Allergies:   Allergies  Allergen Reactions  . Cefepime Other (See Comments)    Neurotoxicity occurring in setting of AKI. Ceftriaxone tolerated during same admit  . Gabapentin Other (See Comments)    unknown  . Hyoscyamine Hives and Swelling    Legs swelling   Disorientation  . Lyrica [Pregabalin] Other (See Comments)    unknown  . Meperidine Hives    Other reaction(s): GI Upset Due to [place]   . Topamax [Topiramate] Other (See Comments)    unknown  . Fentanyl Rash    Pt is allergic to fentanyl patch related to the glue (gives her a rash) Pt states she is NOT allergic to fentanyl IV medicine  . Morphine And Related Rash    Medications: Outpatient Encounter Medications as of 07/29/2020  Medication Sig  . acetaminophen (TYLENOL) 325 MG tablet Take 650 mg by mouth every 6 (six) hours as needed for mild pain.   [name] Kitchen amLODipine (NORVASC) 10 MG tablet Take 1 tablet (10 mg total) by mouth daily.  . budesonide (ENTOCORT EC) 3 MG 24 hr capsule TAKE 3 CAPSULES BY MOUTH ONCE DAILY (Patient taking differently: Take 3 mg by mouth daily. )  . buPROPion (WELLBUTRIN SR) 100 MG 12 hr tablet Take 2 tablets by mouth once daily (Patient taking differently: Take 200 mg by mouth daily. )  . calcium carbonate (TUMS - DOSED IN MG ELEMENTAL CALCIUM) 500 MG chewable tablet Chew 1 tablet by mouth 3 (three) times daily as needed for indigestion or heartburn.  . cholecalciferol (VITAMIN D3) 25 MCG (1000 UT) tablet Take 1,000 Units by mouth daily.   . cycloSPORINE (RESTASIS) 0.05 % ophthalmic emulsion Place 1 drop into both eyes 2 (two) times daily.  [name] Kitchen dexlansoprazole (DEXILANT) 60 MG capsule Take 1 capsule (60 mg total) by mouth daily.  [name] Kitchen Dextran 70-Hypromellose 0.1-0.3 % SOLN Place 1 drop into both  eyes 4 (four) times daily.  [name] Kitchen dicyclomine (BENTYL) 20 MG tablet Take 20 mg by mouth 3 (three) times daily.  . diphenoxylate-atropine (LOMOTIL) 2.5-0.025 MG tablet Take 1 tablet by mouth 4 (four) times daily as needed for diarrhea or loose stools.  . DULoxetine (CYMBALTA) 30 MG capsule Take 3 capsules (90 mg total) by mouth daily.  [name] Kitchen estradiol (ESTRACE) 2 MG tablet Take 1 tablet (2 mg total) by mouth daily.  . famotidine (PEPCID) 20 MG tablet Take 20 mg by mouth at bedtime.  . hydrALAZINE (APRESOLINE) 50 MG tablet Take 1 tablet (50 mg total) by mouth 3 (three) times daily.  [name] Kitchen HYDROmorphone (DILAUDID) 4 MG tablet Take 1 tablet (4 mg total) by mouth every 6 (six) hours as needed for severe pain.  [name] Kitchen lipase/protease/amylase (CREON) 36000 UNITS CPEP capsule Take 1 capsule (36,000 Units total) by mouth 3 (three) times daily as needed (with meals for digestion).  [name] Kitchen loperamide (IMODIUM) 2 MG capsule TAKE 1 CAPSULE BY MOUTH AS NEEDED FOR DIARRHEA OR&nbsp;&nbsp;LOOSE&nbsp;&nbsp;STOOLS (Patient taking differently: Take 2 mg by mouth as needed for diarrhea or loose stools. )  . methadone (DOLOPHINE) 5 MG tablet Take 1 tablet (5 mg total) by mouth 5 (five) times daily.  . metoprolol tartrate (LOPRESSOR) 100 MG tablet Take 1 tablet (100 mg total) by mouth 2 (two) times daily.  . Multiple Vitamins-Minerals (MULTIVITAMIN ADULT PO) Take 1 tablet  by mouth daily.  . Omega-3 1000 MG CAPS Take 1,000 mg by mouth daily.   [name] Kitchen PRESCRIPTION MEDICATION Inject 1 each into the vein daily. Home TPN . [facility] [place] . 1 bag for 16 hours. [phone]  . Probiotic Product (PROBIOTIC-10 PO) Take 1 capsule by mouth daily.   . promethazine (PHENERGAN) 25 MG tablet Take 1 tablet (25 mg total) by mouth every 6 (six) hours as needed for nausea or vomiting. TAKE 1 TABLET BY MOUTH EVERY 6 HOURS AS NEEDED FOR NAUSEA AND VOMITING (Patient taking differently: Take 25 mg by mouth every 6 (six) hours as needed for nausea  or vomiting. )  . sucralfate (CARAFATE) 1 GM/10ML suspension Take 10 mLs (1 g total) by mouth 4 (four) times daily -  with meals and at bedtime.  . sulfamethoxazole-trimethoprim (BACTRIM DS) 800-160 MG tablet Take 1 tablet by mouth 2 (two) times daily for 10 days.  . traZODone (DESYREL) 100 MG tablet Take 2 tablets (200 mg total) by mouth at bedtime as needed for sleep.  . vitamin B-12 (CYANOCOBALAMIN) 100 MCG tablet Take 1,000 mcg by mouth daily.    No facility-administered encounter medications on file as of 07/29/2020.    Current Diagnosis: Patient Active Problem List   Diagnosis Date Noted  . Sepsis due to Enterobacter ([place]) 07/22/2020  . SIRS (systemic inflammatory response syndrome) ([place]) 07/19/2020  . Anxiety 06/03/2020  . Acute pancreatitis 04/13/2020  . Infection due to Stenotrophomonas maltophilia 03/10/2020  . Abnormal LFTs 03/05/2020  . Pancytopenia ([place]) 03/05/2020  . Stage 3b chronic kidney disease 03/05/2020  . Infection of peripherally inserted central venous catheter (PICC)   . On total parenteral nutrition (TPN) 01/02/2020  . Elevated liver enzymes 01/02/2020  . Cholangitis   . Anasarca 10/28/2019  . Acute kidney injury superimposed on chronic kidney disease ([place]) 10/28/2019  . Falls 10/28/2019  . Crohn disease ([place])   . Malnutrition ([place])   . Short gut syndrome   . HTN (hypertension)   . Vitamin B12 deficiency   . Osteoporosis   . Depression   . GERD (gastroesophageal reflux disease)   . Chronic pain syndrome   . Hypokalemia due to excessive gastrointestinal loss of potassium 10/13/2019  . Diarrhea   . Fever   . Hypokalemia 10/12/2019  . Chronic diarrhea 10/12/2019  . Dysuria 10/12/2019  . Bilateral lower extremity edema 10/12/2019  . Crohn's colitis ([place]) 10/12/2019  . Anemia 10/12/2019  . Hypertension 10/12/2019  . AKI (acute kidney injury) ([place]) 10/12/2019    Goals Addressed   None     Follow-Up:  Pharmacist Review   Reviewed chart prior to  disease state call. Spoke with patient regarding BP  Recent Office Vitals: BP Readings from Last 3 Encounters:  07/23/20 (!) 151/65  07/12/20 (!) 155/76  07/08/20 130/80   Pulse Readings from Last 3 Encounters:  07/23/20 (!) 105  07/12/20 81  07/08/20 70    Wt Readings from Last 3 Encounters:  07/19/20 144 lb 4.8 oz (65.5 kg)  07/12/20 146 lb (66.2 kg)  07/08/20 146 lb 4.8 oz (66.4 kg)     Kidney Function Lab Results  Component Value Date/Time   CREATININE 1.78 (H) 07/23/2020 03:23 AM   CREATININE 1.82 (H) 07/22/2020 03:46 AM   GFR 28.52 (L) 05/24/2020 04:10 PM   GFRNONAA 31 (L) 07/23/2020 03:23 AM   GFRAA 36 (L) 07/23/2020 03:23 AM    BMP Latest Ref Rng & Units 07/23/2020 07/22/2020 07/21/2020  Glucose 70 - 99 mg/dL 84 82 118(H)  BUN 6 - 20 mg/dL 15 14 18   Creatinine 0.44 - 1.00 mg/dL 1.78(H) 1.82(H) 1.70(H)  Sodium 135 - 145 mmol/L 138 138 136  Potassium 3.5 - 5.1 mmol/L 4.3 4.4 4.3  Chloride 98 - 111 mmol/L 107 110 106  CO2 22 - 32 mmol/L 21(L) 20(L) 22  Calcium 8.9 - 10.3 mg/dL 9.3 9.1 9.3    . Current antihypertensive regimen:  o Amlodipine 10 mg Daily o Metoprolol 100 mg Daily  . How often are you checking your Blood Pressure? Patient states her daughter bought the blood machince today and will start checking her blood pressure Daily. . Current home BP readings: None ID  . What recent interventions/DTPs have been made by any provider to improve Blood Pressure control since last CPP Visit: None ID . Any recent hospitalizations or ED visits since last visit with CPP? Yes  o 07/19/2020, ED Visit - Fever . What diet changes have been made to improve Blood Pressure Control?  o Patient daughter cooks at home.Patient states she eat want she wants to eat, and "knows what to eat V.S what not to eat".  . What exercise is being done to improve your Blood Pressure Control?  o Patient reports she walks her dog everyday  Adherence Review: Is the patient currently on ACE/ARB  medication? No Does the patient have >5 day gap between last estimated fill dates? Yes   [place] Pharmacist Assistant [phone]

## 2020-07-29 NOTE — Progress Notes (Signed)
Patient requesting refill for Promethazine and Loperamide she is out of both medications both are prescribed by her PCP Dr. Jerilee Hoh.   Sent a message to [facility] Pharmacist Assistant [phone]

## 2020-07-29 NOTE — Telephone Encounter (Signed)
-----   Message from Germaine Pomfret, [facility] sent at 07/29/2020 11:11 AM EDT ----- Regarding: Medication Refill Apolonio Schneiders,  Patient is requesting refills on Promethazine 25 mg and loperamide 2 mg. Can you please send those in to [facility] so we can refill them for her today?  Thank you!  Doristine Section Clinical Pharmacist [facility] [place]

## 2020-08-02 ENCOUNTER — Emergency Department (HOSPITAL_COMMUNITY): Payer: Medicare Other

## 2020-08-02 ENCOUNTER — Inpatient Hospital Stay (HOSPITAL_COMMUNITY)
Admission: EM | Admit: 2020-08-02 | Discharge: 2020-08-12 | DRG: 871 | Disposition: A | Discharge status: Home / Self Care | Payer: Medicare Other | Attending: Internal Medicine | Admitting: Internal Medicine

## 2020-08-02 ENCOUNTER — Encounter (HOSPITAL_COMMUNITY): Payer: Self-pay | Admitting: Emergency Medicine

## 2020-08-02 ENCOUNTER — Inpatient Hospital Stay: Payer: Self-pay | Admitting: Internal Medicine

## 2020-08-02 ENCOUNTER — Other Ambulatory Visit: Payer: Self-pay

## 2020-08-02 DIAGNOSIS — R7881 Bacteremia: Secondary | ICD-10-CM | POA: Diagnosis not present

## 2020-08-02 DIAGNOSIS — Z20822 Contact with and (suspected) exposure to covid-19: Secondary | ICD-10-CM | POA: Diagnosis present

## 2020-08-02 DIAGNOSIS — Z8249 Family history of ischemic heart disease and other diseases of the circulatory system: Secondary | ICD-10-CM

## 2020-08-02 DIAGNOSIS — R410 Disorientation, unspecified: Secondary | ICD-10-CM | POA: Diagnosis not present

## 2020-08-02 DIAGNOSIS — A4159 Other Gram-negative sepsis: Secondary | ICD-10-CM

## 2020-08-02 DIAGNOSIS — N179 Acute kidney failure, unspecified: Secondary | ICD-10-CM | POA: Diagnosis present

## 2020-08-02 DIAGNOSIS — D509 Iron deficiency anemia, unspecified: Secondary | ICD-10-CM | POA: Diagnosis present

## 2020-08-02 DIAGNOSIS — K5090 Crohn's disease, unspecified, without complications: Secondary | ICD-10-CM | POA: Diagnosis present

## 2020-08-02 DIAGNOSIS — T80219D Unspecified infection due to central venous catheter, subsequent encounter: Secondary | ICD-10-CM | POA: Diagnosis not present

## 2020-08-02 DIAGNOSIS — T827XXA Infection and inflammatory reaction due to other cardiac and vascular devices, implants and grafts, initial encounter: Secondary | ICD-10-CM | POA: Diagnosis present

## 2020-08-02 DIAGNOSIS — Z79899 Other long term (current) drug therapy: Secondary | ICD-10-CM | POA: Diagnosis not present

## 2020-08-02 DIAGNOSIS — G9341 Metabolic encephalopathy: Secondary | ICD-10-CM | POA: Diagnosis not present

## 2020-08-02 DIAGNOSIS — I129 Hypertensive chronic kidney disease with stage 1 through stage 4 chronic kidney disease, or unspecified chronic kidney disease: Secondary | ICD-10-CM | POA: Diagnosis present

## 2020-08-02 DIAGNOSIS — R935 Abnormal findings on diagnostic imaging of other abdominal regions, including retroperitoneum: Secondary | ICD-10-CM

## 2020-08-02 DIAGNOSIS — E860 Dehydration: Secondary | ICD-10-CM | POA: Diagnosis present

## 2020-08-02 DIAGNOSIS — K56609 Unspecified intestinal obstruction, unspecified as to partial versus complete obstruction: Secondary | ICD-10-CM

## 2020-08-02 DIAGNOSIS — N1831 Chronic kidney disease, stage 3a: Secondary | ICD-10-CM | POA: Diagnosis not present

## 2020-08-02 DIAGNOSIS — K567 Ileus, unspecified: Secondary | ICD-10-CM

## 2020-08-02 DIAGNOSIS — K912 Postsurgical malabsorption, not elsewhere classified: Secondary | ICD-10-CM | POA: Diagnosis present

## 2020-08-02 DIAGNOSIS — R509 Fever, unspecified: Secondary | ICD-10-CM

## 2020-08-02 DIAGNOSIS — E876 Hypokalemia: Secondary | ICD-10-CM | POA: Diagnosis not present

## 2020-08-02 DIAGNOSIS — T80219A Unspecified infection due to central venous catheter, initial encounter: Secondary | ICD-10-CM | POA: Diagnosis not present

## 2020-08-02 DIAGNOSIS — R109 Unspecified abdominal pain: Secondary | ICD-10-CM

## 2020-08-02 DIAGNOSIS — M810 Age-related osteoporosis without current pathological fracture: Secondary | ICD-10-CM | POA: Diagnosis present

## 2020-08-02 DIAGNOSIS — I10 Essential (primary) hypertension: Secondary | ICD-10-CM | POA: Diagnosis not present

## 2020-08-02 DIAGNOSIS — K50819 Crohn's disease of both small and large intestine with unspecified complications: Secondary | ICD-10-CM | POA: Diagnosis not present

## 2020-08-02 DIAGNOSIS — D696 Thrombocytopenia, unspecified: Secondary | ICD-10-CM | POA: Diagnosis present

## 2020-08-02 DIAGNOSIS — Z83511 Family history of glaucoma: Secondary | ICD-10-CM

## 2020-08-02 DIAGNOSIS — N1832 Chronic kidney disease, stage 3b: Secondary | ICD-10-CM | POA: Diagnosis present

## 2020-08-02 DIAGNOSIS — G894 Chronic pain syndrome: Secondary | ICD-10-CM | POA: Diagnosis present

## 2020-08-02 DIAGNOSIS — D631 Anemia in chronic kidney disease: Secondary | ICD-10-CM | POA: Diagnosis not present

## 2020-08-02 DIAGNOSIS — A419 Sepsis, unspecified organism: Secondary | ICD-10-CM | POA: Diagnosis not present

## 2020-08-02 DIAGNOSIS — Y848 Other medical procedures as the cause of abnormal reaction of the patient, or of later complication, without mention of misadventure at the time of the procedure: Secondary | ICD-10-CM | POA: Diagnosis not present

## 2020-08-02 DIAGNOSIS — K56600 Partial intestinal obstruction, unspecified as to cause: Secondary | ICD-10-CM | POA: Diagnosis not present

## 2020-08-02 DIAGNOSIS — A498 Other bacterial infections of unspecified site: Secondary | ICD-10-CM | POA: Diagnosis not present

## 2020-08-02 DIAGNOSIS — K219 Gastro-esophageal reflux disease without esophagitis: Secondary | ICD-10-CM | POA: Diagnosis present

## 2020-08-02 LAB — URINALYSIS, ROUTINE W REFLEX MICROSCOPIC
Bilirubin Urine: NEGATIVE
Glucose, UA: NEGATIVE mg/dL
Hgb urine dipstick: NEGATIVE
Ketones, ur: NEGATIVE mg/dL
Nitrite: NEGATIVE
Protein, ur: NEGATIVE mg/dL
Specific Gravity, Urine: 1.012 (ref 1.005–1.030)
pH: 6 (ref 5.0–8.0)

## 2020-08-02 LAB — APTT: aPTT: 28 seconds (ref 24–36)

## 2020-08-02 LAB — CBC WITH DIFFERENTIAL/PLATELET
Abs Immature Granulocytes: 0.13 10*3/uL — ABNORMAL HIGH (ref 0.00–0.07)
Basophils Absolute: 0 10*3/uL (ref 0.0–0.1)
Basophils Relative: 0 %
Eosinophils Absolute: 0 10*3/uL (ref 0.0–0.5)
Eosinophils Relative: 0 %
HCT: 24 % — ABNORMAL LOW (ref 36.0–46.0)
Hemoglobin: 8.1 g/dL — ABNORMAL LOW (ref 12.0–15.0)
Immature Granulocytes: 1 %
Lymphocytes Relative: 4 %
Lymphs Abs: 0.6 10*3/uL — ABNORMAL LOW (ref 0.7–4.0)
MCH: 31.2 pg (ref 26.0–34.0)
MCHC: 33.8 g/dL (ref 30.0–36.0)
MCV: 92.3 fL (ref 80.0–100.0)
Monocytes Absolute: 0.5 10*3/uL (ref 0.1–1.0)
Monocytes Relative: 3 %
Neutro Abs: 17.2 10*3/uL — ABNORMAL HIGH (ref 1.7–7.7)
Neutrophils Relative %: 92 %
Platelets: 146 10*3/uL — ABNORMAL LOW (ref 150–400)
RBC: 2.6 MIL/uL — ABNORMAL LOW (ref 3.87–5.11)
RDW: 14.7 % (ref 11.5–15.5)
WBC: 18.5 10*3/uL — ABNORMAL HIGH (ref 4.0–10.5)
nRBC: 0 % (ref 0.0–0.2)

## 2020-08-02 LAB — COMPREHENSIVE METABOLIC PANEL
ALT: 80 U/L — ABNORMAL HIGH (ref 0–44)
AST: 78 U/L — ABNORMAL HIGH (ref 15–41)
Albumin: 3.2 g/dL — ABNORMAL LOW (ref 3.5–5.0)
Alkaline Phosphatase: 360 U/L — ABNORMAL HIGH (ref 38–126)
Anion gap: 16 — ABNORMAL HIGH (ref 5–15)
BUN: 46 mg/dL — ABNORMAL HIGH (ref 6–20)
CO2: 16 mmol/L — ABNORMAL LOW (ref 22–32)
Calcium: 9.2 mg/dL (ref 8.9–10.3)
Chloride: 103 mmol/L (ref 98–111)
Creatinine, Ser: 2.56 mg/dL — ABNORMAL HIGH (ref 0.44–1.00)
GFR calc Af Amer: 23 mL/min — ABNORMAL LOW (ref >60)
GFR calc non Af Amer: 20 mL/min — ABNORMAL LOW (ref >60)
Glucose, Bld: 82 mg/dL (ref 70–99)
Potassium: 4.3 mmol/L (ref 3.5–5.1)
Sodium: 135 mmol/L (ref 135–145)
Total Bilirubin: 1.4 mg/dL — ABNORMAL HIGH (ref 0.3–1.2)
Total Protein: 7.7 g/dL (ref 6.5–8.1)

## 2020-08-02 LAB — PROTIME-INR
INR: 1.1 (ref 0.8–1.2)
Prothrombin Time: 13.3 seconds (ref 11.4–15.2)

## 2020-08-02 LAB — LACTIC ACID, PLASMA
Lactic Acid, Venous: 1.5 mmol/L (ref 0.5–1.9)
Lactic Acid, Venous: 1 mmol/L (ref 0.5–1.9)

## 2020-08-02 LAB — SARS CORONAVIRUS 2 BY RT PCR (HOSPITAL ORDER, PERFORMED IN ~~LOC~~ HOSPITAL LAB): SARS Coronavirus 2: NEGATIVE

## 2020-08-02 LAB — LIPASE, BLOOD: Lipase: 24 U/L (ref 11–51)

## 2020-08-02 MED ORDER — ONDANSETRON HCL 4 MG/2ML IJ SOLN
4.0000 mg | Freq: Once | INTRAMUSCULAR | Status: AC
  Administered 2020-08-02: 4 mg via INTRAVENOUS
  Filled 2020-08-02: qty 2

## 2020-08-02 MED ORDER — SODIUM CHLORIDE 0.9 % IV SOLN
1.0000 g | Freq: Once | INTRAVENOUS | Status: AC
  Administered 2020-08-02: 1 g via INTRAVENOUS
  Filled 2020-08-02: qty 10

## 2020-08-02 MED ORDER — HYDROMORPHONE HCL 1 MG/ML IJ SOLN
0.5000 mg | Freq: Once | INTRAMUSCULAR | Status: AC
  Administered 2020-08-02: 0.5 mg via INTRAVENOUS
  Filled 2020-08-02: qty 1

## 2020-08-02 MED ORDER — LACTATED RINGERS IV SOLN: INTRAVENOUS | Status: DC

## 2020-08-02 MED ORDER — LACTATED RINGERS IV BOLUS (SEPSIS)
250.0000 mL | Freq: Once | INTRAVENOUS | Status: AC
  Administered 2020-08-02: 250 mL via INTRAVENOUS

## 2020-08-02 MED ORDER — LACTATED RINGERS IV BOLUS (SEPSIS)
1000.0000 mL | Freq: Once | INTRAVENOUS | Status: AC
  Administered 2020-08-02: 1000 mL via INTRAVENOUS

## 2020-08-02 MED ORDER — METRONIDAZOLE IN NACL 5-0.79 MG/ML-% IV SOLN
500.0000 mg | Freq: Once | INTRAVENOUS | Status: AC
  Administered 2020-08-02: 500 mg via INTRAVENOUS
  Filled 2020-08-02: qty 100

## 2020-08-02 NOTE — ED Provider Notes (Signed)
[name] DEPT Provider Note   CSN: [id number] Arrival date & time: 08/02/20  1657     History Chief Complaint  Patient presents with  . Fever    Caitlin Peters is a 59 y.o. female.  HPI   Patient has a complex medical history including Crohn's disease, short gut syndrome, recurrent infections and chronic pain syndrome.  Patient states she recently was in the hospital for bacteremia associated with a line infection.  Patient started having trouble with a fever today.  It was up to 101.  Patient has had abdominal pain associated with this but is not having any issues with cough or sore throat.  No urinary symptoms.  Patient has been vaccinated for Covid.  Past Medical History:  Diagnosis Date  . Acute pancreatitis 04/13/2020  . Anasarca 10/2019  . AVN (avascular necrosis of bone) ([place])   . Cataract   . Chronic pain syndrome   . CKD (chronic kidney disease), stage III   . Crohn disease ([place])   . Crohn disease ([place])   . Depression   . Diverticulosis   . GERD (gastroesophageal reflux disease)   . HTN (hypertension)   . IDA (iron deficiency anemia)   . Malnutrition ([place])   . Mass in chest   . Osteoporosis   . Pancreatitis   . Short gut syndrome   . Vitamin B12 deficiency     Patient Active Problem List   Diagnosis Date Noted  . Sepsis due to Enterobacter ([place]) 07/22/2020  . SIRS (systemic inflammatory response syndrome) ([place]) 07/19/2020  . Anxiety 06/03/2020  . Acute pancreatitis 04/13/2020  . Infection due to Stenotrophomonas maltophilia 03/10/2020  . Abnormal LFTs 03/05/2020  . Pancytopenia ([place]) 03/05/2020  . Stage 3b chronic kidney disease 03/05/2020  . Infection of peripherally inserted central venous catheter (PICC)   . On total parenteral nutrition (TPN) 01/02/2020  . Elevated liver enzymes 01/02/2020  . Cholangitis   . Anasarca 10/28/2019  . Acute kidney injury superimposed on chronic kidney disease ([place]) 10/28/2019  .  Falls 10/28/2019  . Crohn disease ([place])   . Malnutrition ([place])   . Short gut syndrome   . HTN (hypertension)   . Vitamin B12 deficiency   . Osteoporosis   . Depression   . GERD (gastroesophageal reflux disease)   . Chronic pain syndrome   . Hypokalemia due to excessive gastrointestinal loss of potassium 10/13/2019  . Diarrhea   . Fever   . Hypokalemia 10/12/2019  . Chronic diarrhea 10/12/2019  . Dysuria 10/12/2019  . Bilateral lower extremity edema 10/12/2019  . Crohn's colitis ([place]) 10/12/2019  . Anemia 10/12/2019  . Hypertension 10/12/2019  . AKI (acute kidney injury) ([place]) 10/12/2019    Past Surgical History:  Procedure Laterality Date  . ABDOMINAL HYSTERECTOMY    . ABDOMINAL SURGERY     colon resection with abdominal stoma, repair of intestinal leak, reversal of abdominal stoma  . BILIARY DILATION  11/26/2019   Procedure: BILIARY DILATION;  Surgeon: Jackquline Denmark, MD;  Location: [facility];  Service: Endoscopy;;  . BILIARY DILATION  03/08/2020   Procedure: BILIARY DILATION;  Surgeon: Irving Copas., MD;  Location: [facility];  Service: Gastroenterology;;  . BIOPSY  03/08/2020   Procedure: BIOPSY;  Surgeon: Irving Copas., MD;  Location: [facility];  Service: Gastroenterology;;  . CHEST WALL RESECTION     right thoracotomy,resection of chest mass with anterior rib and reconstruction using prosthetic mesh and video arthroscopy  . CHOLECYSTECTOMY    .  COLONOSCOPY  2019  . ERCP N/A 11/26/2019   Procedure: ENDOSCOPIC RETROGRADE CHOLANGIOPANCREATOGRAPHY (ERCP);  Surgeon: Jackquline Denmark, MD;  Location: [facility];  Service: Endoscopy;  Laterality: N/A;  . ERCP N/A 03/08/2020   Procedure: ENDOSCOPIC RETROGRADE CHOLANGIOPANCREATOGRAPHY (ERCP);  Surgeon: Irving Copas., MD;  Location: [facility];  Service: Gastroenterology;  Laterality: N/A;  . ESOPHAGOGASTRODUODENOSCOPY N/A 03/08/2020   Procedure: ESOPHAGOGASTRODUODENOSCOPY (EGD);  Surgeon:  Irving Copas., MD;  Location: [facility];  Service: Gastroenterology;  Laterality: N/A;  . EUS N/A 03/08/2020   Procedure: UPPER ENDOSCOPIC ULTRASOUND (EUS) LINEAR;  Surgeon: Irving Copas., MD;  Location: [facility];  Service: Gastroenterology;  Laterality: N/A;  . IR FLUORO GUIDE CV LINE LEFT  01/07/2020  . IR FLUORO GUIDE CV LINE LEFT  03/09/2020  . IR FLUORO GUIDE CV LINE LEFT  05/09/2020  . IR FLUORO GUIDE CV LINE LEFT  07/20/2020  . IR PTA VENOUS EXCEPT DIALYSIS CIRCUIT  01/07/2020  . IR US GUIDE VASC ACCESS LEFT     x 2 06/17/19 and 09/14/2019  . KNEE SURGERY     right knee   . REMOVAL OF STONES  11/26/2019   Procedure: REMOVAL OF STONES;  Surgeon: Jackquline Denmark, MD;  [facility];  Service: Endoscopy;;  . REMOVAL OF STONES  03/08/2020   Procedure: REMOVAL OF STONES;  Surgeon: Irving Copas., MD;  Location: [facility];  Service: Gastroenterology;;  . SMALL INTESTINE SURGERY     x3  . SPHINCTEROTOMY  11/26/2019   Procedure: SPHINCTEROTOMY;  Surgeon: Jackquline Denmark, MD;  Location: [facility];  Service: Endoscopy;;  . UPPER GASTROINTESTINAL ENDOSCOPY       OB History   No obstetric history on file.     Family History  Problem Relation Age of Onset  . Breast cancer Sister   . Multiple sclerosis Sister   . Diabetes Sister   . Lupus Sister   . Colon cancer Other   . Crohn's disease Other   . Seizures Mother   . Glaucoma Mother   . CAD Father   . Heart disease Father   . Hypertension Father     Social History   Tobacco Use  . Smoking status: Former Research scientist (life sciences)  . Smokeless tobacco: Never Used  Vaping Use  . Vaping Use: Never used  Substance Use Topics  . Alcohol use: Not Currently  . Drug use: Never    Home Medications Prior to Admission medications   Medication Sig Start Date End Date Taking? Authorizing Provider  acetaminophen (TYLENOL) 325 MG tablet Take 650 mg by mouth every 6 (six) hours as needed for mild pain.    Yes  [provider]  amLODipine (NORVASC) 10 MG tablet Take 1 tablet (10 mg total) by mouth daily. 06/23/20 08/02/20 Yes Erline Hau, MD  budesonide (ENTOCORT EC) 3 MG 24 hr capsule TAKE 3 CAPSULES BY MOUTH ONCE DAILY Patient taking differently: Take 3 mg by mouth daily.  04/05/20  Yes [name], [name], MD  buPROPion [facility] SR) 100 MG 12 hr tablet Take 2 tablets by mouth once daily Patient taking differently: Take 200 mg by mouth daily.  07/04/20  Yes [name], [name], MD  calcium carbonate (TUMS - DOSED IN MG ELEMENTAL CALCIUM) 500 MG chewable tablet Chew 1 tablet by mouth 3 (three) times daily as needed for indigestion or heartburn.   Yes [provider]  cholecalciferol (VITAMIN D3) 25 MCG (1000 UT) tablet Take 1,000 Units by mouth daily.  Yes [provider]  cycloSPORINE (RESTASIS) 0.05 % ophthalmic emulsion Place 1 drop into both eyes 2 (two) times daily.   Yes [provider]  dexlansoprazole (DEXILANT) 60 MG capsule Take 1 capsule (60 mg total) by mouth daily. 06/28/20  Yes [name], [name], MD  Dextran 70-Hypromellose 0.1-0.3 % SOLN Place 1 drop into both eyes 4 (four) times daily.   Yes [provider]  dicyclomine (BENTYL) 20 MG tablet Take 20 mg by mouth 3 (three) times daily. 10/26/19  Yes [provider]  diphenoxylate-atropine (LOMOTIL) 2.5-0.025 MG tablet Take 1 tablet by mouth 4 (four) times daily as needed for diarrhea or loose stools. 05/24/20  Yes [name], [name], MD  DULoxetine (CYMBALTA) 30 MG capsule Take 3 capsules (90 mg total) by mouth daily. 05/24/20  Yes [name], [name], MD  estradiol (ESTRACE) 2 MG tablet Take 1 tablet (2 mg total) by mouth daily. 06/28/20  Yes [name], [name], MD  famotidine (PEPCID) 20 MG tablet Take 20 mg by mouth at bedtime.   Yes [provider]  hydrALAZINE (APRESOLINE) 50 MG tablet Take 1 tablet (50 mg total) by mouth  3 (three) times daily. 06/28/20  Yes [name], [name], MD  HYDROmorphone (DILAUDID) 4 MG tablet Take 1 tablet (4 mg total) by mouth every 6 (six) hours as needed for severe pain. 06/30/20  Yes Florencia Reasons, MD  lipase/protease/amylase (CREON) 36000 UNITS CPEP capsule Take 1 capsule (36,000 Units total) by mouth 3 (three) times daily as needed (with meals for digestion). 06/28/20  Yes [name], [name], MD  loperamide (IMODIUM) 2 MG capsule TAKE 1 CAPSULE BY MOUTH AS NEEDED FOR DIARRHEA OR  LOOSE  STOOLS Patient taking differently: Take 2 mg by mouth as needed for diarrhea or loose stools.  07/29/20  Yes Erline Hau, MD  methadone (DOLOPHINE) 5 MG tablet Take 1 tablet (5 mg total) by mouth 5 (five) times daily. 06/30/20  Yes Florencia Reasons, MD  metoprolol tartrate (LOPRESSOR) 100 MG tablet Take 1 tablet (100 mg total) by mouth 2 (two) times daily. 06/28/20  Yes [name], [name], MD  Multiple Vitamins-Minerals (MULTIVITAMIN ADULT PO) Take 1 tablet by mouth daily.   Yes [provider]  Omega-3 1000 MG CAPS Take 1,000 mg by mouth daily.    Yes [provider]  PRESCRIPTION MEDICATION Inject 1 each into the vein daily. Home TPN . [facility] [place] . 1 bag for 16 hours. [phone]   Yes [provider]  Probiotic Product (PROBIOTIC-10 PO) Take 1 capsule by mouth daily.    Yes [provider]  promethazine (PHENERGAN) 25 MG tablet Take 1 tablet (25 mg total) by mouth every 6 (six) hours as needed for nausea or vomiting. TAKE 1 TABLET BY MOUTH EVERY 6 HOURS AS NEEDED FOR NAUSEA AND VOMITING 07/29/20  Yes [name], [name], MD  sucralfate (CARAFATE) 1 GM/10ML suspension Take 10 mLs (1 g total) by mouth 4 (four) times daily -  with meals and at bedtime. 06/28/20  Yes [name], [name], MD  traZODone (DESYREL) 100 MG tablet Take 2 tablets (200 mg total) by mouth at bedtime as needed for sleep. 06/28/20  Yes [name], [name], MD  vitamin B-12 (CYANOCOBALAMIN) 100 MCG tablet Take 1,000 mcg by mouth daily.    Yes [provider]  sulfamethoxazole-trimethoprim (BACTRIM DS) 800-160 MG tablet Take 1 tablet by mouth 2 (  two) times daily for 10 days. Patient not taking: Reported on 08/02/2020 07/23/20 08/02/20  Antonieta Pert, MD    Allergies    Cefepime, Gabapentin, Hyoscyamine, Lyrica [pregabalin], Meperidine, Topamax [topiramate], Fentanyl, and Morphine and related  Review of Systems   Review of Systems  All other systems reviewed and are negative.   Physical Exam Updated Vital Signs BP (!) 126/52   Pulse 73   Temp (!) 100.8 F (38.2 C) (Oral)   Resp (!) 24   SpO2 100%   Physical Exam Vitals and nursing note reviewed.  Constitutional:      Appearance: She is well-developed. She is not toxic-appearing or diaphoretic.  HENT:     Head: Normocephalic and atraumatic.     Right Ear: External ear normal.     Left Ear: External ear normal.  Eyes:     General: No scleral icterus.       Right eye: No discharge.        Left eye: No discharge.     Conjunctiva/sclera: Conjunctivae normal.  Neck:     Trachea: No tracheal deviation.  Cardiovascular:     Rate and Rhythm: Normal rate and regular rhythm.  Pulmonary:     Effort: Pulmonary effort is normal. No respiratory distress.     Breath sounds: Normal breath sounds. No stridor. No wheezing or rales.  Abdominal:     General: Bowel sounds are normal. There is no distension.     Palpations: Abdomen is soft.     Tenderness: There is abdominal tenderness. There is no guarding or rebound.     Comments: Generalized tenderness  Musculoskeletal:        General: No tenderness.     Cervical back: Neck supple.     Right lower leg: No edema.     Left lower leg: No edema.     Comments: Indwelling venous catheter left lower extremity, no surrounding erythema or drainage  Skin:    General: Skin is warm and dry.     Findings: No rash.  Neurological:      Mental Status: She is alert.     Cranial Nerves: No cranial nerve deficit (no facial droop, extraocular movements intact, no slurred speech).     Sensory: No sensory deficit.     Motor: No abnormal muscle tone or seizure activity.     Coordination: Coordination normal.     ED Results / Procedures / Treatments   Labs (all labs ordered are listed, but only abnormal results are displayed) Labs Reviewed  COMPREHENSIVE METABOLIC PANEL - Abnormal; Notable for the following components:      Result Value   CO2 16 (*)    BUN 46 (*)    Creatinine, Ser 2.56 (*)    Albumin 3.2 (*)    AST 78 (*)    ALT 80 (*)    Alkaline Phosphatase 360 (*)    Total Bilirubin 1.4 (*)    GFR calc non Af Amer 20 (*)    GFR calc Af Amer 23 (*)    Anion gap 16 (*)    All other components within normal limits  CBC WITH DIFFERENTIAL/PLATELET - Abnormal; Notable for the following components:   WBC 18.5 (*)    RBC 2.60 (*)    Hemoglobin 8.1 (*)    HCT 24.0 (*)    Platelets 146 (*)    Neutro Abs 17.2 (*)    Lymphs Abs 0.6 (*)    Abs Immature Granulocytes 0.13 (*)  All other components within normal limits  CULTURE, BLOOD (ROUTINE X 2)  CULTURE, BLOOD (ROUTINE X 2)  URINE CULTURE  SARS CORONAVIRUS 2 BY RT PCR (HOSPITAL ORDER, [facility])  LACTIC ACID, PLASMA  LACTIC ACID, PLASMA  PROTIME-INR  APTT  LIPASE, BLOOD  CBC WITH DIFFERENTIAL/PLATELET  URINALYSIS, ROUTINE W REFLEX MICROSCOPIC  I-STAT BETA HCG BLOOD, ED ([facility], [facility], [facility] ONLY)    EKG EKG Interpretation  Date/Time:  Tuesday August 02 2020 19:49:35 EDT Ventricular Rate:  60 PR Interval:    QRS Duration: 88 QT Interval:  406 QTC Calculation: 406 R Axis:   76 Text Interpretation: Sinus rhythm Atrial premature complexes Consider left ventricular hypertrophy Minimal ST elevation, inferior leads Since last tracing rate slower Confirmed by Dorie Rank [phone]) on 08/02/2020 8:13:24 PM   Radiology CT ABDOMEN PELVIS  WO CONTRAST  Result Date: 08/02/2020 CLINICAL DATA:  Fever EXAM: CT ABDOMEN AND PELVIS WITHOUT CONTRAST TECHNIQUE: Multidetector CT imaging of the abdomen and pelvis was performed following the standard protocol without IV contrast. COMPARISON:  CT 07/19/2020, 06/27/2020, 05/05/2020, 04/13/2020 FINDINGS: Lower chest: Lung bases demonstrate no acute consolidation or effusion. Borderline to mild cardiomegaly. Hepatobiliary: No focal hepatic abnormality. Small amount of pneumobilia, chronic finding. Status post cholecystectomy. Pancreas: No inflammatory change.  Minimal ductal prominence. Spleen: Normal in size without focal abnormality. Adrenals/Urinary Tract: Adrenal glands are unremarkable. Kidneys are normal, without renal calculi, focal lesion, or hydronephrosis. Bladder is unremarkable. Stomach/Bowel: Contrast within the stomach and small bowel. Contrast is also reached the distal colon. No obstructive changes are identified. Evidence of prior bowel resection. No acute bowel wall thickening. Vascular/Lymphatic: Left lower extremity central venous catheter with tip in the intra hepatic IVC. No suspicious adenopathy. Reproductive: Status post hysterectomy. No adnexal masses. Other: Negative for free air or free fluid. Musculoskeletal: No acute osseous abnormality. Stable focal sclerosis in the left iliac bone. IMPRESSION: No CT evidence for acute intra-abdominal or pelvic abnormality. Evidence of prior bowel resection without evidence for obstruction or acute bowel wall thickening. Status post cholecystectomy. Trace amount of pneumobilia, chronic finding. Left lower extremity central venous catheter with tip in the intra hepatic IVC. Electronically Signed   By: Donavan Foil M.D.   On: 08/02/2020 23:00   DG Chest Port 1 View  Result Date: 08/02/2020 CLINICAL DATA:  Fever, sepsis EXAM: PORTABLE CHEST 1 VIEW COMPARISON:  07/19/2020 FINDINGS: Single frontal view of the chest demonstrates a stable cardiac  silhouette. No airspace disease, effusion, or pneumothorax. No acute bony abnormalities. Surgical clips right hilum unchanged. IMPRESSION: 1. No acute intrathoracic process. Electronically Signed   By: Randa Ngo M.D.   On: 08/02/2020 20:00    Procedures .Critical Care Performed by: Dorie Rank, MD Authorized by: Dorie Rank, MD   Critical care provider statement:    Critical care time (minutes):  45   Critical care was time spent personally by me on the following activities:  Discussions with consultants, evaluation of patient's response to treatment, examination of patient, ordering and performing treatments and interventions, ordering and review of laboratory studies, ordering and review of radiographic studies, pulse oximetry, re-evaluation of patient's condition, obtaining history from patient or surrogate and review of old charts   (including critical care time)  Medications Ordered in ED Medications  lactated ringers infusion (has no administration in time range)  lactated ringers bolus 1,000 mL (1,000 mLs Intravenous New Bag/Given 08/02/20 2115)    And  lactated ringers bolus 1,000 mL (has no administration  in time range)    And  lactated ringers bolus 250 mL (has no administration in time range)  metroNIDAZOLE (FLAGYL) IVPB 500 mg (500 mg Intravenous New Bag/Given 08/02/20 2211)  cefTRIAXone (ROCEPHIN) 1 g in sodium chloride 0.9 % 100 mL IVPB (0 g Intravenous Stopped 08/02/20 2200)  ondansetron (ZOFRAN) injection 4 mg (4 mg Intravenous Given 08/02/20 2128)  HYDROmorphone (DILAUDID) injection 0.5 mg (0.5 mg Intravenous Given 08/02/20 2127)    ED Course  I have reviewed the triage vital signs and the nursing notes.  Pertinent labs & imaging results that were available during my care of the patient were reviewed by me and considered in my medical decision making (see chart for details).  Clinical Course as of Aug 03 2323  Tue Aug 02, 2020  2028 Bp has improved   [JK]  2153  Leukocytosis new since previous.  Anemia stable.   [JK]  2154 Creatinine increased compared to previous.  LFTs elevated   [JK]  2154 LFTs previous were elevated but bilirubin elevation is increased from prior   [JK]  2154 Chest x-ray without pneumonia   [JK]  2324 BP normal.  Heart rate normal   [JK]    Clinical Course User Index [JK] Dorie Rank, MD   MDM Rules/Calculators/A&P                          Patient presented to ED for evaluation of recurrent fever.  Patient has history of prior line infection and sepsis.  Patient had an episode of hypotension in the surgery but lactic acid levels normal now.  Blood pressure is improved.  No signs of severe sepsis.  Patient does have an AKI as well as leukocytosis.  CT scan without acute findings.  With her history of immunocompromise state and recurrent infections I think it is reasonable to bring her in for IV antibiotics and monitoring. Monitor blood cultures.   I will consult the medical service. Final Clinical Impression(s) / ED Diagnoses Final diagnoses:  Febrile illness      Dorie Rank, MD 08/02/20 2324

## 2020-08-02 NOTE — ED Triage Notes (Signed)
Per EMS, coming from home-complaining of a fever-tylenol 650 mg taken 2 hours ago-finished antibiotics(bactrim) today-infectious disease MD told her to come to ED-pain all over

## 2020-08-03 DIAGNOSIS — A419 Sepsis, unspecified organism: Secondary | ICD-10-CM

## 2020-08-03 LAB — BLOOD CULTURE ID PANEL (REFLEXED) - BCID2
A.calcoaceticus-baumannii: NOT DETECTED
Bacteroides fragilis: NOT DETECTED
CTX-M ESBL: NOT DETECTED
Candida albicans: NOT DETECTED
Candida auris: NOT DETECTED
Candida glabrata: NOT DETECTED
Candida krusei: NOT DETECTED
Candida parapsilosis: NOT DETECTED
Candida tropicalis: NOT DETECTED
Carbapenem resist OXA 48 LIKE: NOT DETECTED
Carbapenem resistance IMP: NOT DETECTED
Carbapenem resistance KPC: NOT DETECTED
Carbapenem resistance NDM: NOT DETECTED
Carbapenem resistance VIM: NOT DETECTED
Cryptococcus neoformans/gattii: NOT DETECTED
Enterobacter cloacae complex: DETECTED — AB
Enterobacterales: DETECTED — AB
Enterococcus Faecium: NOT DETECTED
Enterococcus faecalis: NOT DETECTED
Escherichia coli: NOT DETECTED
Haemophilus influenzae: NOT DETECTED
Klebsiella aerogenes: NOT DETECTED
Klebsiella oxytoca: NOT DETECTED
Klebsiella pneumoniae: NOT DETECTED
Listeria monocytogenes: NOT DETECTED
Neisseria meningitidis: NOT DETECTED
Proteus species: NOT DETECTED
Pseudomonas aeruginosa: NOT DETECTED
Salmonella species: NOT DETECTED
Serratia marcescens: NOT DETECTED
Staphylococcus aureus (BCID): NOT DETECTED
Staphylococcus epidermidis: NOT DETECTED
Staphylococcus lugdunensis: NOT DETECTED
Staphylococcus species: NOT DETECTED
Stenotrophomonas maltophilia: NOT DETECTED
Streptococcus agalactiae: NOT DETECTED
Streptococcus pneumoniae: NOT DETECTED
Streptococcus pyogenes: NOT DETECTED
Streptococcus species: NOT DETECTED

## 2020-08-03 LAB — COMPREHENSIVE METABOLIC PANEL
ALT: 79 U/L — ABNORMAL HIGH (ref 0–44)
AST: 68 U/L — ABNORMAL HIGH (ref 15–41)
Albumin: 2.8 g/dL — ABNORMAL LOW (ref 3.5–5.0)
Alkaline Phosphatase: 314 U/L — ABNORMAL HIGH (ref 38–126)
Anion gap: 14 (ref 5–15)
BUN: 44 mg/dL — ABNORMAL HIGH (ref 6–20)
CO2: 20 mmol/L — ABNORMAL LOW (ref 22–32)
Calcium: 9.2 mg/dL (ref 8.9–10.3)
Chloride: 102 mmol/L (ref 98–111)
Creatinine, Ser: 2.29 mg/dL — ABNORMAL HIGH (ref 0.44–1.00)
GFR calc Af Amer: 26 mL/min — ABNORMAL LOW (ref >60)
GFR calc non Af Amer: 23 mL/min — ABNORMAL LOW (ref >60)
Glucose, Bld: 100 mg/dL — ABNORMAL HIGH (ref 70–99)
Potassium: 4.6 mmol/L (ref 3.5–5.1)
Sodium: 136 mmol/L (ref 135–145)
Total Bilirubin: 2 mg/dL — ABNORMAL HIGH (ref 0.3–1.2)
Total Protein: 6.7 g/dL (ref 6.5–8.1)

## 2020-08-03 LAB — CBC
HCT: 24.9 % — ABNORMAL LOW (ref 36.0–46.0)
Hemoglobin: 7.9 g/dL — ABNORMAL LOW (ref 12.0–15.0)
MCH: 29.8 pg (ref 26.0–34.0)
MCHC: 31.7 g/dL (ref 30.0–36.0)
MCV: 94 fL (ref 80.0–100.0)
Platelets: 128 10*3/uL — ABNORMAL LOW (ref 150–400)
RBC: 2.65 MIL/uL — ABNORMAL LOW (ref 3.87–5.11)
RDW: 14.7 % (ref 11.5–15.5)
WBC: 19.8 10*3/uL — ABNORMAL HIGH (ref 4.0–10.5)
nRBC: 0 % (ref 0.0–0.2)

## 2020-08-03 LAB — CREATININE, SERUM
Creatinine, Ser: 2.19 mg/dL — ABNORMAL HIGH (ref 0.44–1.00)
GFR calc Af Amer: 28 mL/min — ABNORMAL LOW (ref >60)
GFR calc non Af Amer: 24 mL/min — ABNORMAL LOW (ref >60)

## 2020-08-03 LAB — MAGNESIUM: Magnesium: 1.8 mg/dL (ref 1.7–2.4)

## 2020-08-03 LAB — PHOSPHORUS: Phosphorus: 3.7 mg/dL (ref 2.5–4.6)

## 2020-08-03 MED ORDER — PIPERACILLIN-TAZOBACTAM 3.375 G IVPB 30 MIN
3.3750 g | Freq: Once | INTRAVENOUS | Status: AC
  Administered 2020-08-03: 3.375 g via INTRAVENOUS

## 2020-08-03 MED ORDER — VITAMIN B-12 1000 MCG PO TABS
1000.0000 ug | ORAL_TABLET | Freq: Every day | ORAL | Status: DC
  Administered 2020-08-03 – 2020-08-12 (×10): 1000 ug via ORAL
  Filled 2020-08-03 (×10): qty 1

## 2020-08-03 MED ORDER — DIPHENHYDRAMINE HCL 50 MG/ML IJ SOLN
25.0000 mg | Freq: Once | INTRAMUSCULAR | Status: AC | PRN
  Administered 2020-08-03: 25 mg via INTRAVENOUS
  Filled 2020-08-03: qty 1

## 2020-08-03 MED ORDER — HEPARIN SODIUM (PORCINE) 5000 UNIT/ML IJ SOLN
5000.0000 [IU] | Freq: Three times a day (TID) | INTRAMUSCULAR | Status: DC
  Administered 2020-08-03 – 2020-08-05 (×7): 5000 [IU] via SUBCUTANEOUS
  Filled 2020-08-03 (×7): qty 1

## 2020-08-03 MED ORDER — CYCLOSPORINE 0.05 % OP EMUL
1.0000 [drp] | Freq: Two times a day (BID) | OPHTHALMIC | Status: DC
  Administered 2020-08-03 – 2020-08-12 (×18): 1 [drp] via OPHTHALMIC
  Filled 2020-08-03 (×23): qty 1

## 2020-08-03 MED ORDER — HYDROMORPHONE HCL 2 MG PO TABS: 4.0000 mg | ORAL_TABLET | Freq: Four times a day (QID) | ORAL | Status: DC | PRN

## 2020-08-03 MED ORDER — PIPERACILLIN-TAZOBACTAM 3.375 G IVPB
3.3750 g | Freq: Three times a day (TID) | INTRAVENOUS | Status: DC
  Administered 2020-08-03: 3.375 g via INTRAVENOUS
  Filled 2020-08-03: qty 50

## 2020-08-03 MED ORDER — BUDESONIDE 3 MG PO CPEP
3.0000 mg | ORAL_CAPSULE | Freq: Every day | ORAL | Status: DC
  Administered 2020-08-03 – 2020-08-12 (×9): 3 mg via ORAL
  Filled 2020-08-03 (×12): qty 1

## 2020-08-03 MED ORDER — SODIUM CHLORIDE 0.9 % IV SOLN
2.0000 g | INTRAVENOUS | Status: DC
  Administered 2020-08-03 – 2020-08-04 (×2): 2 g via INTRAVENOUS
  Filled 2020-08-03 (×4): qty 2

## 2020-08-03 MED ORDER — ACETAMINOPHEN 650 MG RE SUPP: 650.0000 mg | Freq: Four times a day (QID) | RECTAL | Status: DC | PRN

## 2020-08-03 MED ORDER — PANTOPRAZOLE SODIUM 40 MG PO TBEC
40.0000 mg | DELAYED_RELEASE_TABLET | Freq: Every day | ORAL | Status: DC
  Administered 2020-08-03 – 2020-08-12 (×10): 40 mg via ORAL
  Filled 2020-08-03 (×9): qty 1

## 2020-08-03 MED ORDER — METHADONE HCL 5 MG PO TABS
5.0000 mg | ORAL_TABLET | Freq: Every day | ORAL | Status: DC
  Administered 2020-08-03 – 2020-08-12 (×47): 5 mg via ORAL
  Filled 2020-08-03 (×47): qty 1

## 2020-08-03 MED ORDER — VANCOMYCIN HCL IN DEXTROSE 1-5 GM/200ML-% IV SOLN: 1000.0000 mg | INTRAVENOUS | Status: DC

## 2020-08-03 MED ORDER — OMEGA-3-ACID ETHYL ESTERS 1 G PO CAPS
1000.0000 mg | ORAL_CAPSULE | Freq: Every day | ORAL | Status: DC
  Administered 2020-08-04 – 2020-08-12 (×9): 1000 mg via ORAL
  Filled 2020-08-03 (×10): qty 1

## 2020-08-03 MED ORDER — HYDROMORPHONE HCL 1 MG/ML IJ SOLN
1.0000 mg | INTRAMUSCULAR | Status: DC | PRN
  Administered 2020-08-03 – 2020-08-05 (×12): 1 mg via INTRAVENOUS
  Filled 2020-08-03 (×13): qty 1

## 2020-08-03 MED ORDER — VITAMIN D 25 MCG (1000 UNIT) PO TABS
1000.0000 [IU] | ORAL_TABLET | Freq: Every day | ORAL | Status: DC
  Administered 2020-08-04 – 2020-08-12 (×9): 1000 [IU] via ORAL
  Filled 2020-08-03 (×10): qty 1

## 2020-08-03 MED ORDER — DULOXETINE HCL 60 MG PO CPEP
90.0000 mg | ORAL_CAPSULE | Freq: Every day | ORAL | Status: DC
  Administered 2020-08-03 – 2020-08-12 (×10): 90 mg via ORAL
  Filled 2020-08-03 (×5): qty 1
  Filled 2020-08-03: qty 3
  Filled 2020-08-03 (×3): qty 1
  Filled 2020-08-03: qty 3

## 2020-08-03 MED ORDER — SODIUM CHLORIDE 0.9 % IV SOLN
2.0000 g | Freq: Two times a day (BID) | INTRAVENOUS | Status: DC
  Filled 2020-08-03: qty 2

## 2020-08-03 MED ORDER — DEXTRAN 70-HYPROMELLOSE 0.1-0.3 % OP SOLN: 1.0000 [drp] | Freq: Four times a day (QID) | OPHTHALMIC | Status: DC

## 2020-08-03 MED ORDER — ONDANSETRON HCL 4 MG/2ML IJ SOLN
4.0000 mg | Freq: Four times a day (QID) | INTRAMUSCULAR | Status: DC | PRN
  Administered 2020-08-03 – 2020-08-11 (×15): 4 mg via INTRAVENOUS
  Filled 2020-08-03 (×14): qty 2

## 2020-08-03 MED ORDER — POLYVINYL ALCOHOL 1.4 % OP SOLN: 1.0000 [drp] | OPHTHALMIC | Status: DC | PRN

## 2020-08-03 MED ORDER — BUPROPION HCL ER (SR) 100 MG PO TB12
200.0000 mg | ORAL_TABLET | Freq: Every day | ORAL | Status: DC
  Administered 2020-08-03 – 2020-08-12 (×9): 200 mg via ORAL
  Filled 2020-08-03 (×12): qty 2

## 2020-08-03 MED ORDER — ESTRADIOL 1 MG PO TABS
2.0000 mg | ORAL_TABLET | Freq: Every day | ORAL | Status: DC
  Administered 2020-08-03 – 2020-08-12 (×10): 2 mg via ORAL
  Filled 2020-08-03 (×10): qty 2

## 2020-08-03 MED ORDER — LACTATED RINGERS IV SOLN: INTRAVENOUS | Status: AC

## 2020-08-03 MED ORDER — SUCRALFATE 1 GM/10ML PO SUSP
1.0000 g | Freq: Three times a day (TID) | ORAL | Status: DC
  Administered 2020-08-03 – 2020-08-12 (×37): 1 g via ORAL
  Filled 2020-08-03 (×37): qty 10

## 2020-08-03 MED ORDER — VANCOMYCIN HCL 1250 MG/250ML IV SOLN
1250.0000 mg | Freq: Once | INTRAVENOUS | Status: AC
  Administered 2020-08-03: 1250 mg via INTRAVENOUS
  Filled 2020-08-03: qty 250

## 2020-08-03 MED ORDER — PANCRELIPASE (LIP-PROT-AMYL) 12000-38000 UNITS PO CPEP
36000.0000 [IU] | ORAL_CAPSULE | Freq: Three times a day (TID) | ORAL | Status: DC | PRN
  Administered 2020-08-04 – 2020-08-08 (×5): 36000 [IU] via ORAL
  Filled 2020-08-03 (×2): qty 1
  Filled 2020-08-03: qty 3
  Filled 2020-08-03: qty 1
  Filled 2020-08-03: qty 3
  Filled 2020-08-03: qty 1

## 2020-08-03 MED ORDER — PIPERACILLIN-TAZOBACTAM 3.375 G IVPB
3.3750 g | Freq: Once | INTRAVENOUS | Status: DC
  Filled 2020-08-03: qty 50

## 2020-08-03 MED ORDER — ACETAMINOPHEN 325 MG PO TABS
650.0000 mg | ORAL_TABLET | Freq: Four times a day (QID) | ORAL | Status: DC | PRN
  Administered 2020-08-03: 650 mg via ORAL
  Filled 2020-08-03: qty 2

## 2020-08-03 NOTE — Progress Notes (Signed)
Pharmacy Antibiotic Note  Caitlin Peters is a 59 y.o. female admitted on 08/02/2020 with sepsis.  Pharmacy has been consulted for vancomycin and Zosyn dosing.    Plan: Zosyn 3.375g IV q8h (4 hour infusion).  Vancomycin 1250 mg IV x1, then 1000 mg IV q48h Daily Scr ordered     Temp (24hrs), Avg:100.8 F (38.2 C), Min:100.8 F (38.2 C), Max:100.8 F (38.2 C)  Recent Labs  Lab 08/02/20 1856 08/02/20 2020  WBC  --  18.5*  CREATININE 2.56*  --   LATICACIDVEN 1.5 1.0    CrCl cannot be calculated (Unknown ideal weight.).    Allergies  Allergen Reactions  . Cefepime Other (See Comments)    Neurotoxicity occurring in setting of AKI. Ceftriaxone tolerated during same admit  . Gabapentin Other (See Comments)    unknown  . Hyoscyamine Hives and Swelling    Legs swelling   Disorientation  . Lyrica [Pregabalin] Other (See Comments)    unknown  . Meperidine Hives    Other reaction(s): GI Upset Due to [place]   . Topamax [Topiramate] Other (See Comments)    unknown  . Fentanyl Rash    Pt is allergic to fentanyl patch related to the glue (gives her a rash) Pt states she is NOT allergic to fentanyl IV medicine  . Morphine And Related Rash    Antimicrobials this admission: 9/7 metronidazole x1  9/8 vancomycin >>  9/8 Zosyn >>   Dose adjustments this admission:    Microbiology results: 9/7 BCx:  9/7 UCx:      Thank you for allowing pharmacy to be a part of this patient's care.  Royetta Asal, PharmD, BCPS 08/03/2020 3:45 AM

## 2020-08-03 NOTE — Telephone Encounter (Signed)
Patient currently in the hospital 

## 2020-08-03 NOTE — Progress Notes (Signed)
Patient ID: Caitlin Peters, female   DOB: [birth year], 59 y.o.   MRN: [id number] Patient admitted early this morning for fever and is currently on broad-spectrum antibiotics. Patient seen and examined at bedside and plan of care discussed with her. She states that she had itching of her Zosyn and did not feel well subsequently had to stop it. Does not want to try Zosyn at this time. I have reviewed patient's medical records including this morning's H&P, current vitals, labs and medications myself. We will switch to [place]. Hold TPN for today. Follow cultures. Repeat a.m. labs.

## 2020-08-03 NOTE — H&P (Addendum)
History and Physical    Caitlin Peters JGO:[id number] DOB: [birth year] DOA: 08/02/2020  PCP: [name], [name], MD Patient coming from: Home  Chief Complaint: Fever  HPI: Caitlin Peters is a 59 y.o. female with medical history significant of chronic diarrhea, hypertension, CKD stage III, chronic anemia, Crohn's disease, short gut syndrome/TPN dependent, chronic pain on methadone, malnutrition, anxiety, depression, tobacco use, recent hospital admission for Enterobacter and stenotrophomonas sepsis in the setting of long-term PICC line in the left thigh started on Bactrim.  She is presenting to the ED via EMS complaining of fever.  Patient states she finished her course of Bactrim today and started having fevers.  Also complaining of left-sided abdominal pain and nausea.  Reports having chronic diarrhea.  Denies cough or shortness of breath.  ED Course: Febrile with temperature 100.8 F.  Tachycardic.  One isolated low blood pressure reading of 81/46 in the chart, remainder of blood pressure readings with systolic above 559.  WBC 18.5, hemoglobin 8.1, hematocrit 24.0, platelet 146k.  Sodium 135, potassium 4.3, chloride 103, bicarb 16, BUN 46, creatinine 2.5, glucose 82, anion gap 16.  LFTs chronically elevated and no significant change compared to recent labs.  Lactic acid normal x2.  INR 1.1.  Lipase normal.  SARS-CoV-2 PCR test negative.  UA with negative nitrite, small amount of leukocytes, 6-10 WBCs, and rare bacteria.  Urine culture pending.  Blood culture x2 pending.  Chest x-ray not suggestive of pneumonia.  CT abdomen pelvis not showing obvious infectious source.  Patient was given Dilaudid, Zofran, ceftriaxone, metronidazole, vancomycin, and 3 L LR boluses.  Review of Systems:  All systems reviewed and apart from history of presenting illness, are negative.  Past Medical History:  Diagnosis Date  . Acute pancreatitis 04/13/2020  . Anasarca 10/2019  . AVN (avascular necrosis  of bone) ([place])   . Cataract   . Chronic pain syndrome   . CKD (chronic kidney disease), stage III   . Crohn disease ([place])   . Crohn disease ([place])   . Depression   . Diverticulosis   . GERD (gastroesophageal reflux disease)   . HTN (hypertension)   . IDA (iron deficiency anemia)   . Malnutrition ([place])   . Mass in chest   . Osteoporosis   . Pancreatitis   . Short gut syndrome   . Vitamin B12 deficiency     Past Surgical History:  Procedure Laterality Date  . ABDOMINAL HYSTERECTOMY    . ABDOMINAL SURGERY     colon resection with abdominal stoma, repair of intestinal leak, reversal of abdominal stoma  . BILIARY DILATION  11/26/2019   Procedure: BILIARY DILATION;  Surgeon: Jackquline Denmark, MD;  Location: [facility];  Service: Endoscopy;;  . BILIARY DILATION  03/08/2020   Procedure: BILIARY DILATION;  Surgeon: Irving Copas., MD;  Location: [place];  Service: Gastroenterology;;  . BIOPSY  03/08/2020   Procedure: BIOPSY;  Surgeon: Irving Copas., MD;  Location: [place];  Service: Gastroenterology;;  . CHEST WALL RESECTION     right thoracotomy,resection of chest mass with anterior rib and reconstruction using prosthetic mesh and video arthroscopy  . CHOLECYSTECTOMY    . COLONOSCOPY  2019  . ERCP N/A 11/26/2019   Procedure: ENDOSCOPIC RETROGRADE CHOLANGIOPANCREATOGRAPHY (ERCP);  Surgeon: Jackquline Denmark, MD;  Location: [facility];  Service: Endoscopy;  Laterality: N/A;  . ERCP N/A 03/08/2020   Procedure: ENDOSCOPIC RETROGRADE CHOLANGIOPANCREATOGRAPHY (ERCP);  Surgeon: Irving Copas., MD;  Location: [place];  Service: Gastroenterology;  Laterality: N/A;  . ESOPHAGOGASTRODUODENOSCOPY N/A 03/08/2020   Procedure: ESOPHAGOGASTRODUODENOSCOPY (EGD);  Surgeon: Irving Copas., MD;  Location: [place];  Service: Gastroenterology;  Laterality: N/A;  . EUS N/A 03/08/2020   Procedure: UPPER ENDOSCOPIC ULTRASOUND (EUS) LINEAR;  Surgeon: Irving Copas., MD;  Location: [place];  Service: Gastroenterology;  Laterality: N/A;  . IR FLUORO GUIDE CV LINE LEFT  01/07/2020  . IR FLUORO GUIDE CV LINE LEFT  03/09/2020  . IR FLUORO GUIDE CV LINE LEFT  05/09/2020  . IR FLUORO GUIDE CV LINE LEFT  07/20/2020  . IR PTA VENOUS EXCEPT DIALYSIS CIRCUIT  01/07/2020  . IR US GUIDE VASC ACCESS LEFT     x 2 06/17/19 and 09/14/2019  . KNEE SURGERY     right knee   . REMOVAL OF STONES  11/26/2019   Procedure: REMOVAL OF STONES;  Surgeon: Jackquline Denmark, MD;  Location: [facility];  Service: Endoscopy;;  . REMOVAL OF STONES  03/08/2020   Procedure: REMOVAL OF STONES;  Surgeon: Irving Copas., MD;  Location: [place];  Service: Gastroenterology;;  . SMALL INTESTINE SURGERY     x3  . SPHINCTEROTOMY  11/26/2019   Procedure: SPHINCTEROTOMY;  Surgeon: Jackquline Denmark, MD;  Location: [facility];  Service: Endoscopy;;  . UPPER GASTROINTESTINAL ENDOSCOPY       reports that she has quit smoking. She has never used smokeless tobacco. She reports previous alcohol use. She reports that she does not use drugs.  Allergies  Allergen Reactions  . Cefepime Other (See Comments)    Neurotoxicity occurring in setting of AKI. Ceftriaxone tolerated during same admit  . Gabapentin Other (See Comments)    unknown  . Hyoscyamine Hives and Swelling    Legs swelling   Disorientation  . Lyrica [Pregabalin] Other (See Comments)    unknown  . Meperidine Hives    Other reaction(s): GI Upset Due to [place]   . Topamax [Topiramate] Other (See Comments)    unknown  . Fentanyl Rash    Pt is allergic to fentanyl patch related to the glue (gives her a rash) Pt states she is NOT allergic to fentanyl IV medicine  . Morphine And Related Rash    Family History  Problem Relation Age of Onset  . Breast cancer Sister   . Multiple sclerosis Sister   . Diabetes Sister   . Lupus Sister   . Colon cancer Other   . Crohn's disease Other   . Seizures Mother     . Glaucoma Mother   . CAD Father   . Heart disease Father   . Hypertension Father     Prior to Admission medications   Medication Sig Start Date End Date Taking? Authorizing Provider  acetaminophen (TYLENOL) 325 MG tablet Take 650 mg by mouth every 6 (six) hours as needed for mild pain.    Yes [provider]  amLODipine (NORVASC) 10 MG tablet Take 1 tablet (10 mg total) by mouth daily. 06/23/20 08/02/20 Yes Erline Hau, MD  budesonide (ENTOCORT EC) 3 MG 24 hr capsule TAKE 3 CAPSULES BY MOUTH ONCE DAILY Patient taking differently: Take 3 mg by mouth daily.  04/05/20  Yes [name], [name], MD  buPROP[facility] SR) 100 MG 12 hr tablet Take 2 tablets by mouth once daily Patient taking differently: Take 200 mg by mouth daily.  07/04/20  Yes [name], [name], MD  calcium carbonate (TUMS - DOSED IN MG ELEMENTAL CALCIUM) 500 MG chewable tablet  Chew 1 tablet by mouth 3 (three) times daily as needed for indigestion or heartburn.   Yes [provider]  cholecalciferol (VITAMIN D3) 25 MCG (1000 UT) tablet Take 1,000 Units by mouth daily.    Yes [provider]  cycloSPORINE (RESTASIS) 0.05 % ophthalmic emulsion Place 1 drop into both eyes 2 (two) times daily.   Yes [provider]  dexlansoprazole (DEXILANT) 60 MG capsule Take 1 capsule (60 mg total) by mouth daily. 06/28/20  Yes [name], [name], MD  Dextran 70-Hypromellose 0.1-0.3 % SOLN Place 1 drop into both eyes 4 (four) times daily.   Yes [provider]  dicyclomine (BENTYL) 20 MG tablet Take 20 mg by mouth 3 (three) times daily. 10/26/19  Yes [provider]  diphenoxylate-atropine (LOMOTIL) 2.5-0.025 MG tablet Take 1 tablet by mouth 4 (four) times daily as needed for diarrhea or loose stools. 05/24/20  Yes [name], [name], MD  DULoxetine (CYMBALTA) 30 MG capsule Take 3 capsules (90 mg total) by mouth daily. 05/24/20  Yes [name],  [name], MD  estradiol (ESTRACE) 2 MG tablet Take 1 tablet (2 mg total) by mouth daily. 06/28/20  Yes [name], [name], MD  famotidine (PEPCID) 20 MG tablet Take 20 mg by mouth at bedtime.   Yes [provider]  hydrALAZINE (APRESOLINE) 50 MG tablet Take 1 tablet (50 mg total) by mouth 3 (three) times daily. 06/28/20  Yes [name], [name], MD  HYDROmorphone (DILAUDID) 4 MG tablet Take 1 tablet (4 mg total) by mouth every 6 (six) hours as needed for severe pain. 06/30/20  Yes Florencia Reasons, MD  lipase/protease/amylase (CREON) 36000 UNITS CPEP capsule Take 1 capsule (36,000 Units total) by mouth 3 (three) times daily as needed (with meals for digestion). 06/28/20  Yes [name], [name], MD  loperamide (IMODIUM) 2 MG capsule TAKE 1 CAPSULE BY MOUTH AS NEEDED FOR DIARRHEA OR  LOOSE  STOOLS Patient taking differently: Take 2 mg by mouth as needed for diarrhea or loose stools.  07/29/20  Yes Erline Hau, MD  methadone (DOLOPHINE) 5 MG tablet Take 1 tablet (5 mg total) by mouth 5 (five) times daily. 06/30/20  Yes Florencia Reasons, MD  metoprolol tartrate (LOPRESSOR) 100 MG tablet Take 1 tablet (100 mg total) by mouth 2 (two) times daily. 06/28/20  Yes [name], [name], MD  Multiple Vitamins-Minerals (MULTIVITAMIN ADULT PO) Take 1 tablet by mouth daily.   Yes [provider]  Omega-3 1000 MG CAPS Take 1,000 mg by mouth daily.    Yes [provider]  PRESCRIPTION MEDICATION Inject 1 each into the vein daily. Home TPN . [facility] . 1 bag for 16 hours. [phone]   Yes [provider]  Probiotic Product (PROBIOTIC-10 PO) Take 1 capsule by mouth daily.    Yes [provider]  promethazine (PHENERGAN) 25 MG tablet Take 1 tablet (25 mg total) by mouth every 6 (six) hours as needed for nausea or vomiting. TAKE 1 TABLET BY MOUTH EVERY 6 HOURS AS NEEDED FOR NAUSEA AND VOMITING 07/29/20  Yes [name], [name], MD  sucralfate (CARAFATE) 1 GM/10ML suspension Take 10 mLs (1 g total) by mouth 4 (four) times daily -  with meals and at bedtime. 06/28/20  Yes [name], [name], MD  traZODone (DESYREL) 100 MG tablet Take 2 tablets (200 mg total) by mouth at bedtime as needed for sleep. 06/28/20  Yes [name], [name], MD  vitamin B-12 (CYANOCOBALAMIN) 100 MCG tablet Take 1,000 mcg by mouth daily.    Yes [provider]    Physical Exam: Vitals:   08/02/20 2350 08/03/20 0100 08/03/20 0130 08/03/20 0230  BP: (!) 104/48 (!) 100/42 137/72 124/63  Pulse: 65 (!) 106 (!) 117 70  Resp: 17 14 13 15   Temp:      TempSrc:      SpO2: 98% 97% 99% 97%    Physical Exam Constitutional:      General: She is not in acute distress. HENT:     Head: Normocephalic and atraumatic.  Eyes:     Extraocular Movements: Extraocular movements intact.     Conjunctiva/sclera: Conjunctivae normal.  Cardiovascular:     Rate and Rhythm: Normal rate and regular rhythm.     Pulses: Normal pulses.  Pulmonary:     Effort: Pulmonary effort is normal. No respiratory distress.     Breath sounds: Normal breath sounds. No wheezing or rales.  Abdominal:     General: Bowel sounds are normal. There is no distension.     Palpations: Abdomen is soft.     Tenderness: There is no rebound.     Comments: Generalized tenderness to palpation  Musculoskeletal:        General: No swelling or tenderness.     Cervical back: Normal range of motion and neck supple.  Skin:    General: Skin is warm and dry.  Neurological:     Mental Status: She is alert and oriented to person, place, and time.   Addendum: Site of PICC line on left thigh without obvious signs of infection.  Labs on Admission: I have personally reviewed following labs and imaging studies  CBC: Recent Labs  Lab 08/02/20 2020  WBC 18.5*  NEUTROABS 17.2*  HGB 8.1*  HCT 24.0*  MCV 92.3  PLT 400*   Basic Metabolic Panel: Recent Labs  Lab  08/02/20 1856  NA 135  K 4.3  CL 103  CO2 16*  GLUCOSE 82  BUN 46*  CREATININE 2.56*  CALCIUM 9.2   GFR: CrCl cannot be calculated (Unknown ideal weight.). Liver Function Tests: Recent Labs  Lab 08/02/20 1856  AST 78*  ALT 80*  ALKPHOS 360*  BILITOT 1.4*  PROT 7.7  ALBUMIN 3.2*   Recent Labs  Lab 08/02/20 2020  LIPASE 24   No results for input(s): AMMONIA in the last 168 hours. Coagulation Profile: Recent Labs  Lab 08/02/20 2020  INR 1.1   Cardiac Enzymes: No results for input(s): CKTOTAL, CKMB, CKMBINDEX, TROPONINI in the last 168 hours. BNP (last 3 results) No results for input(s): PROBNP in the last 8760 hours. HbA1C: No results for input(s): HGBA1C in the last 72 hours. CBG: No results for input(s): GLUCAP in the last 168 hours. Lipid Profile: No results for input(s): CHOL, HDL, LDLCALC, TRIG, CHOLHDL, LDLDIRECT in the last 72 hours. Thyroid Function Tests: No results for input(s): TSH, T4TOTAL, FREET4, T3FREE, THYROIDAB in the last 72 hours. Anemia Panel: No results for input(s): VITAMINB12, FOLATE, FERRITIN, TIBC, IRON, RETICCTPCT in the last 72 hours. Urine analysis:    Component Value Date/Time   COLORURINE YELLOW 08/02/2020 [place] 08/02/2020 1706   LABSPEC 1.012 08/02/2020 1706   PHURINE 6.0 08/02/2020 1706   GLUCOSEU NEGATIVE 08/02/2020 1706   HGBUR NEGATIVE 08/02/2020 1706   BILIRUBINUR NEGATIVE 08/02/2020 1706   KETONESUR NEGATIVE 08/02/2020 1706   PROTEINUR NEGATIVE 08/02/2020 1706  NITRITE NEGATIVE 08/02/2020 1706   LEUKOCYTESUR SMALL (A) 08/02/2020 1706    Radiological Exams on Admission: CT ABDOMEN PELVIS WO CONTRAST  Result Date: 08/02/2020 CLINICAL DATA:  Fever EXAM: CT ABDOMEN AND PELVIS WITHOUT CONTRAST TECHNIQUE: Multidetector CT imaging of the abdomen and pelvis was performed following the standard protocol without IV contrast. COMPARISON:  CT 07/19/2020, 06/27/2020, 05/05/2020, 04/13/2020 FINDINGS: Lower  chest: Lung bases demonstrate no acute consolidation or effusion. Borderline to mild cardiomegaly. Hepatobiliary: No focal hepatic abnormality. Small amount of pneumobilia, chronic finding. Status post cholecystectomy. Pancreas: No inflammatory change.  Minimal ductal prominence. Spleen: Normal in size without focal abnormality. Adrenals/Urinary Tract: Adrenal glands are unremarkable. Kidneys are normal, without renal calculi, focal lesion, or hydronephrosis. Bladder is unremarkable. Stomach/Bowel: Contrast within the stomach and small bowel. Contrast is also reached the distal colon. No obstructive changes are identified. Evidence of prior bowel resection. No acute bowel wall thickening. Vascular/Lymphatic: Left lower extremity central venous catheter with tip in the intra hepatic IVC. No suspicious adenopathy. Reproductive: Status post hysterectomy. No adnexal masses. Other: Negative for free air or free fluid. Musculoskeletal: No acute osseous abnormality. Stable focal sclerosis in the left iliac bone. IMPRESSION: No CT evidence for acute intra-abdominal or pelvic abnormality. Evidence of prior bowel resection without evidence for obstruction or acute bowel wall thickening. Status post cholecystectomy. Trace amount of pneumobilia, chronic finding. Left lower extremity central venous catheter with tip in the intra hepatic IVC. Electronically Signed   By: Donavan Foil M.D.   On: 08/02/2020 23:00   DG Chest Port 1 View  Result Date: 08/02/2020 CLINICAL DATA:  Fever, sepsis EXAM: PORTABLE CHEST 1 VIEW COMPARISON:  07/19/2020 FINDINGS: Single frontal view of the chest demonstrates a stable cardiac silhouette. No airspace disease, effusion, or pneumothorax. No acute bony abnormalities. Surgical clips right hilum unchanged. IMPRESSION: 1. No acute intrathoracic process. Electronically Signed   By: Randa Ngo M.D.   On: 08/02/2020 20:00    EKG: Independently reviewed.  Sinus rhythm, LVH,  PACs.  Assessment/Plan Principal Problem:   Sepsis ([place]) Active Problems:   Anemia   AKI (acute kidney injury) ([place])   Crohn disease ([place])   Chronic pain syndrome   Sepsis: Febrile, tachycardic, and labs showing leukocytosis.  No hypotension or lactic acidosis to suggest severe sepsis.  No clear source of infection at this time although does have significant risk in the setting of PICC line and TPN. SARS-CoV-2 PCR test negative.  Chest x-ray not suggestive of pneumonia.  UA not strongly suggestive of infection (negative nitrite, small amount of leukocytes, 6-10 WBCs, rare bacteria).  CT abdomen pelvis without obvious infectious source.  No headache, altered mental status, or meningeal signs. -Patient received fluid boluses per sepsis protocol, no longer tachycardic.  Broad-spectrum antibiotic coverage with vancomycin and Zosyn.  Urine culture pending.  Blood culture x2 pending.  Continue to monitor WBC count.  AKI on CKD stage III: Possibly prerenal azotemia from dehydration/sepsis.  BUN 46, creatinine 2.5.  Baseline creatinine 1.6-1.7.  CT without evidence of obstructive uropathy. -IV fluid hydration.  Monitor renal function and urine output.  Avoid nephrotoxic agents.  Chronic anemia: Hemoglobin 8.1, improved compared to recent labs. -Continue to monitor  High anion gap metabolic acidosis: Likely due to AKI.  Bicarb 16, anion gap 16. -IV fluid hydration and continue to monitor labs  Crohn's disease/short gut syndrome -Continue budesonide.  Continue TPN.  Chronic pain syndrome -Continue home methadone schedule, Dilaudid as needed for breakthrough pain  Hypertension -Hold antihypertensives  at this time in setting of sepsis  DVT prophylaxis: Subcutaneous heparin Code Status: Patient wishes to be full code. Family Communication: No family available at this time. Disposition Plan: Status is: Inpatient  Remains inpatient appropriate because:IV treatments appropriate due to intensity  of illness or inability to take PO and Inpatient level of care appropriate due to severity of illness   Dispo: The patient is from: Home              Anticipated d/c is to: Home              Anticipated d/c date is: > 3 days              Patient currently is not medically stable to d/c.  The medical decision making on this patient was of high complexity and the patient is at high risk for clinical deterioration, therefore this is a level 3 visit.  Shela Leff MD [facility]  If 7PM-7AM, please contact night-coverage [email or web address]  08/03/2020, 3:14 AM

## 2020-08-03 NOTE — Progress Notes (Addendum)
PHARMACY - PHYSICIAN COMMUNICATION CRITICAL VALUE ALERT - BLOOD CULTURE IDENTIFICATION (BCID)  Caitlin Peters is an 59 y.o. female who presented to [facility] on 08/02/2020 with a chief complaint of fevers.  Assessment:  49 yoF with recent hospital admission for Enterobacter and stenotrophomonas sepsis in the setting of long-term PICC line in the left thigh.  Recently completed Bactrim.  Name of physician (or Provider) Contacted: Dr Starla Link  Current antibiotics: Ceftazidime, Vancomycin  Changes to prescribed antibiotics recommended:  Recommendations accepted by provider  D/C vanc, ceftazidime Cefepime 2g IV q24 Cefepime "allergy" noted (Neurotoxicity occurring in setting of AKI).    Results for orders placed or performed during the hospital encounter of 08/02/20  Blood Culture ID Panel (Reflexed) (Collected: 08/02/2020  7:31 PM)  Result Value Ref Range   Enterococcus faecalis NOT DETECTED NOT DETECTED   Enterococcus Faecium NOT DETECTED NOT DETECTED   Listeria monocytogenes NOT DETECTED NOT DETECTED   Staphylococcus species NOT DETECTED NOT DETECTED   Staphylococcus aureus (BCID) NOT DETECTED NOT DETECTED   Staphylococcus epidermidis NOT DETECTED NOT DETECTED   Staphylococcus lugdunensis NOT DETECTED NOT DETECTED   Streptococcus species NOT DETECTED NOT DETECTED   Streptococcus agalactiae NOT DETECTED NOT DETECTED   Streptococcus pneumoniae NOT DETECTED NOT DETECTED   Streptococcus pyogenes NOT DETECTED NOT DETECTED   A.calcoaceticus-baumannii NOT DETECTED NOT DETECTED   Bacteroides fragilis NOT DETECTED NOT DETECTED   Enterobacterales DETECTED (A) NOT DETECTED   Enterobacter cloacae complex DETECTED (A) NOT DETECTED   Escherichia coli NOT DETECTED NOT DETECTED   Klebsiella aerogenes NOT DETECTED NOT DETECTED   Klebsiella oxytoca NOT DETECTED NOT DETECTED   Klebsiella pneumoniae NOT DETECTED NOT DETECTED   Proteus species NOT DETECTED NOT DETECTED   Salmonella species NOT  DETECTED NOT DETECTED   Serratia marcescens NOT DETECTED NOT DETECTED   Haemophilus influenzae NOT DETECTED NOT DETECTED   Neisseria meningitidis NOT DETECTED NOT DETECTED   Pseudomonas aeruginosa NOT DETECTED NOT DETECTED   Stenotrophomonas maltophilia NOT DETECTED NOT DETECTED   Candida albicans NOT DETECTED NOT DETECTED   Candida auris NOT DETECTED NOT DETECTED   Candida glabrata NOT DETECTED NOT DETECTED   Candida krusei NOT DETECTED NOT DETECTED   Candida parapsilosis NOT DETECTED NOT DETECTED   Candida tropicalis NOT DETECTED NOT DETECTED   Cryptococcus neoformans/gattii NOT DETECTED NOT DETECTED   CTX-M ESBL NOT DETECTED NOT DETECTED   Carbapenem resistance IMP NOT DETECTED NOT DETECTED   Carbapenem resistance KPC NOT DETECTED NOT DETECTED   Carbapenem resistance NDM NOT DETECTED NOT DETECTED   Carbapenem resist OXA 48 LIKE NOT DETECTED NOT DETECTED   Carbapenem resistance VIM NOT DETECTED NOT DETECTED    Gretta Arab PharmD, BCPS Clinical Pharmacist [facility] main pharmacy [phone] 08/03/2020 1:06 PM

## 2020-08-03 NOTE — ED Notes (Signed)
I gave patient a dinner tray

## 2020-08-03 NOTE — Progress Notes (Signed)
Pharmacy Note   A consult was received from an ED physician for vancomycin per pharmacy dosing.    [name] profile has been reviewed for ht/wt/allergies/indication/available labs.    A one time order has been placed for vancomycin 1250 mg IV x1 .    Further antibiotics/pharmacy consults should be ordered by admitting physician if indicated.                       Thank you,  Royetta Asal, PharmD, BCPS 08/03/2020 12:02 AM

## 2020-08-04 DIAGNOSIS — G894 Chronic pain syndrome: Secondary | ICD-10-CM

## 2020-08-04 DIAGNOSIS — T827XXA Infection and inflammatory reaction due to other cardiac and vascular devices, implants and grafts, initial encounter: Secondary | ICD-10-CM | POA: Diagnosis present

## 2020-08-04 DIAGNOSIS — N1831 Chronic kidney disease, stage 3a: Secondary | ICD-10-CM

## 2020-08-04 DIAGNOSIS — R7881 Bacteremia: Secondary | ICD-10-CM

## 2020-08-04 DIAGNOSIS — N179 Acute kidney failure, unspecified: Secondary | ICD-10-CM

## 2020-08-04 DIAGNOSIS — T80219A Unspecified infection due to central venous catheter, initial encounter: Secondary | ICD-10-CM

## 2020-08-04 DIAGNOSIS — K50819 Crohn's disease of both small and large intestine with unspecified complications: Secondary | ICD-10-CM

## 2020-08-04 DIAGNOSIS — A498 Other bacterial infections of unspecified site: Secondary | ICD-10-CM

## 2020-08-04 LAB — COMPREHENSIVE METABOLIC PANEL
ALT: 55 U/L — ABNORMAL HIGH (ref 0–44)
AST: 36 U/L (ref 15–41)
Albumin: 2.6 g/dL — ABNORMAL LOW (ref 3.5–5.0)
Alkaline Phosphatase: 259 U/L — ABNORMAL HIGH (ref 38–126)
Anion gap: 6 (ref 5–15)
BUN: 28 mg/dL — ABNORMAL HIGH (ref 6–20)
CO2: 20 mmol/L — ABNORMAL LOW (ref 22–32)
Calcium: 8.9 mg/dL (ref 8.9–10.3)
Chloride: 108 mmol/L (ref 98–111)
Creatinine, Ser: 2.17 mg/dL — ABNORMAL HIGH (ref 0.44–1.00)
GFR calc Af Amer: 28 mL/min — ABNORMAL LOW (ref >60)
GFR calc non Af Amer: 24 mL/min — ABNORMAL LOW (ref >60)
Glucose, Bld: 98 mg/dL (ref 70–99)
Potassium: 4.4 mmol/L (ref 3.5–5.1)
Sodium: 134 mmol/L — ABNORMAL LOW (ref 135–145)
Total Bilirubin: 0.9 mg/dL (ref 0.3–1.2)
Total Protein: 6.6 g/dL (ref 6.5–8.1)

## 2020-08-04 LAB — URINE CULTURE: Culture: NO GROWTH

## 2020-08-04 LAB — CBC WITH DIFFERENTIAL/PLATELET
Abs Immature Granulocytes: 0.05 10*3/uL (ref 0.00–0.07)
Basophils Absolute: 0 10*3/uL (ref 0.0–0.1)
Basophils Relative: 0 %
Eosinophils Absolute: 0 10*3/uL (ref 0.0–0.5)
Eosinophils Relative: 0 %
HCT: 22.9 % — ABNORMAL LOW (ref 36.0–46.0)
Hemoglobin: 7.3 g/dL — ABNORMAL LOW (ref 12.0–15.0)
Immature Granulocytes: 1 %
Lymphocytes Relative: 21 %
Lymphs Abs: 1.5 10*3/uL (ref 0.7–4.0)
MCH: 30.3 pg (ref 26.0–34.0)
MCHC: 31.9 g/dL (ref 30.0–36.0)
MCV: 95 fL (ref 80.0–100.0)
Monocytes Absolute: 0.6 10*3/uL (ref 0.1–1.0)
Monocytes Relative: 8 %
Neutro Abs: 5 10*3/uL (ref 1.7–7.7)
Neutrophils Relative %: 70 %
Platelets: 104 10*3/uL — ABNORMAL LOW (ref 150–400)
RBC: 2.41 MIL/uL — ABNORMAL LOW (ref 3.87–5.11)
RDW: 14.9 % (ref 11.5–15.5)
WBC: 7.2 10*3/uL (ref 4.0–10.5)
nRBC: 0 % (ref 0.0–0.2)

## 2020-08-04 LAB — MAGNESIUM: Magnesium: 2 mg/dL (ref 1.7–2.4)

## 2020-08-04 LAB — C-REACTIVE PROTEIN: CRP: 12.4 mg/dL — ABNORMAL HIGH (ref <1.0)

## 2020-08-04 LAB — PHOSPHORUS: Phosphorus: 3.3 mg/dL (ref 2.5–4.6)

## 2020-08-04 LAB — PROCALCITONIN: Procalcitonin: 43.6 ng/mL

## 2020-08-04 MED ORDER — SODIUM CHLORIDE 0.9 % IV SOLN: INTRAVENOUS | Status: DC

## 2020-08-04 NOTE — ED Notes (Signed)
Pt received food tray. CS

## 2020-08-04 NOTE — Consult Note (Signed)
Date of Admission:  08/02/2020          Reason for Consult: Enterobactercloacae  bacteremia and line infection    Referring Provider: Dr. Remi Haggard   Assessment:  1. 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 Limited IV access 3. Short gut syndrome 4. Crohn's disease 5. TPN dependence  Plan:  1. Continue cefepime 2. Follow-up culture data 3. She needs at minimum this line exchanged over a wire.  Is there another site that she could have an IV placed such as her right femoral area?  She claims she has never had a line in that area. Would consult IR 4. We will plan on giving her 2 weeks of parenteral therapy  Principal Problem:   Sepsis ([place]) Active Problems:   Anemia   AKI (acute kidney injury) ([place])   Crohn disease ([name])   Chronic pain syndrome   Bacteremia   Scheduled Meds: . budesonide  3 mg Oral Daily  . buPROPion  200 mg Oral Daily  . cholecalciferol  1,000 Units Oral Daily  . cycloSPORINE  1 drop Both Eyes BID  . DULoxetine  90 mg Oral Daily  . estradiol  2 mg Oral Daily  . heparin  5,000 Units Subcutaneous Q8H  . methadone  5 mg Oral 5 X Daily  . omega-3 acid ethyl esters  1,000 mg Oral Daily  . pantoprazole  40 mg Oral Daily  . sucralfate  1 g Oral TID WC & HS  . vitamin B-12  1,000 mcg Oral Daily   Continuous Infusions: . sodium chloride Stopped (08/04/20 1235)  . ceFEPime (MAXIPIME) IV Stopped (08/03/20 1444)   PRN Meds:.acetaminophen **OR** acetaminophen, HYDROmorphone (DILAUDID) injection, lipase/protease/amylase, ondansetron (ZOFRAN) IV, polyvinyl alcohol  HPI: Caitlin Peters is a 59 y.o. 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  She has had recurrent line infections and begun to run out of IV access.  She has had several line associated bacteremia since moving from [place] here to [place].  These are  well-documented in particular on Dr. Hale Bogus last note from August 4.  She was recently admitted to the hospital service in late August and found to have Enterobacter and stenotrophomonas bacteremia.  She underwent exchange of her line and was treated with Bactrim.  She was treated with this with plans to complete therapy on 7 September.  She tells me that today she began having fevers even before she stopped her oral Bactrim.  She came to the hospital and was admitted to the hospitalist service [facility] ID is identified and Enterobacter species which now is growing and is Enterobacter cloacae.  She has been on cefepime which should be continued..  Interventional radiology to be consulted as to whether there is another site that they could place an IV or whether she will have undergo yet another exchange of a line.   Review of Systems: Review of Systems  Constitutional: Positive for fever and malaise/fatigue. Negative for chills and weight loss.  HENT: Negative for congestion and sore throat.   Eyes: Negative for blurred vision and photophobia.  Respiratory: Negative for cough, shortness of breath and wheezing.   Cardiovascular: Negative for chest pain, palpitations and leg swelling.  Gastrointestinal: Negative for abdominal pain, blood in stool, constipation, diarrhea, heartburn, melena, nausea and vomiting.  Genitourinary: Negative for dysuria, flank pain and hematuria.  Musculoskeletal: Negative for back pain,  falls, joint pain and myalgias.  Skin: Negative for itching and rash.  Neurological: Negative for dizziness, focal weakness, loss of consciousness, weakness and headaches.  Endo/Heme/Allergies: Does not bruise/bleed easily.  Psychiatric/Behavioral: Positive for depression. Negative for suicidal ideas. The patient does not have insomnia.     Past Medical History:  Diagnosis Date  . Acute pancreatitis 04/13/2020  . Anasarca 10/2019  . AVN (avascular necrosis of bone) ([place])   . Cataract    . Chronic pain syndrome   . CKD (chronic kidney disease), stage III   . Crohn disease ([place])   . Crohn disease ([place])   . Depression   . Diverticulosis   . GERD (gastroesophageal reflux disease)   . HTN (hypertension)   . IDA (iron deficiency anemia)   . Malnutrition ([place])   . Mass in chest   . Osteoporosis   . Pancreatitis   . Short gut syndrome   . Vitamin B12 deficiency     Social History   Tobacco Use  . Smoking status: Former Research scientist (life sciences)  . Smokeless tobacco: Never Used  Vaping Use  . Vaping Use: Never used  Substance Use Topics  . Alcohol use: Not Currently  . Drug use: Never    Family History  Problem Relation Age of Onset  . Breast cancer Sister   . Multiple sclerosis Sister   . Diabetes Sister   . Lupus Sister   . Colon cancer Other   . Crohn's disease Other   . Seizures Mother   . Glaucoma Mother   . CAD Father   . Heart disease Father   . Hypertension Father    Allergies  Allergen Reactions  . Cefepime Other (See Comments)    Neurotoxicity occurring in setting of AKI. Ceftriaxone tolerated during same admit  . Gabapentin Other (See Comments)    unknown  . Hyoscyamine Hives and Swelling    Legs swelling   Disorientation  . Lyrica [Pregabalin] Other (See Comments)    unknown  . Meperidine Hives    Other reaction(s): GI Upset Due to [place]   . Topamax [Topiramate] Other (See Comments)    unknown  . Zosyn [Piperacillin Sod-Tazobactam So]     Patient reports it makes her vomit, her neck stiff, and her "heart feel funny"  . Fentanyl Rash    Pt is allergic to fentanyl patch related to the glue (gives her a rash) Pt states she is NOT allergic to fentanyl IV medicine  . Morphine And Related Rash    OBJECTIVE: Blood pressure (!) 148/72, pulse 86, temperature 98.3 F (36.8 C), temperature source Oral, resp. rate 18, SpO2 99 %.  Physical Exam Constitutional:      General: She is not in acute distress.    Appearance: Normal appearance. She is  well-developed. She is not ill-appearing or diaphoretic.  HENT:     Head: Normocephalic and atraumatic.     Right Ear: Hearing and external ear normal.     Left Ear: Hearing and external ear normal.     Nose: No nasal deformity or rhinorrhea.  Eyes:     General: No scleral icterus.    Conjunctiva/sclera: Conjunctivae normal.     Right eye: Right conjunctiva is not injected.     Left eye: Left conjunctiva is not injected.     Pupils: Pupils are equal, round, and reactive to light.  Neck:     Vascular: No JVD.  Cardiovascular:     Rate and Rhythm: Normal rate and regular rhythm.  Heart sounds: Normal heart sounds, S1 normal and S2 normal. No murmur heard.  No friction rub.  Abdominal:     General: Bowel sounds are normal. There is no distension.     Palpations: Abdomen is soft.     Tenderness: There is no abdominal tenderness.  Musculoskeletal:        General: Normal range of motion.     Right shoulder: Normal.     Left shoulder: Normal.     Cervical back: Normal range of motion and neck supple.     Right hip: Normal.     Left hip: Normal.     Right knee: Normal.     Left knee: Normal.  Lymphadenopathy:     Head:     Right side of head: No submandibular, preauricular or posterior auricular adenopathy.     Left side of head: No submandibular, preauricular or posterior auricular adenopathy.     Cervical: No cervical adenopathy.     Right cervical: No superficial or deep cervical adenopathy.    Left cervical: No superficial or deep cervical adenopathy.  Skin:    General: Skin is warm and dry.     Coloration: Skin is not pale.     Findings: No abrasion, bruising, ecchymosis, erythema, lesion or rash.     Nails: There is no clubbing.  Neurological:     General: No focal deficit present.     Mental Status: She is alert and oriented to person, place, and time.     Sensory: No sensory deficit.     Coordination: Coordination normal.     Gait: Gait normal.  Psychiatric:         Attention and Perception: She is attentive.        Mood and Affect: Mood normal.        Speech: Speech normal.        Behavior: Behavior normal. Behavior is cooperative.        Thought Content: Thought content normal.        Judgment: Judgment normal.    Femoral line is not overtly infected (though we know that it is) Lab Results Lab Results  Component Value Date   WBC 7.2 08/04/2020   HGB 7.3 (L) 08/04/2020   HCT 22.9 (L) 08/04/2020   MCV 95.0 08/04/2020   PLT 104 (L) 08/04/2020    Lab Results  Component Value Date   CREATININE 2.17 (H) 08/04/2020   BUN 28 (H) 08/04/2020   NA 134 (L) 08/04/2020   K 4.4 08/04/2020   CL 108 08/04/2020   CO2 20 (L) 08/04/2020    Lab Results  Component Value Date   ALT 55 (H) 08/04/2020   AST 36 08/04/2020   ALKPHOS 259 (H) 08/04/2020   BILITOT 0.9 08/04/2020     Microbiology: Recent Results (from the past 240 hour(s))  Blood Culture (routine x 2)     Status: None (Preliminary result)   Collection Time: 08/02/20  7:26 PM   Specimen: BLOOD  Result Value Ref Range Status   Specimen Description   Final    BLOOD BLOOD RIGHT ARM Performed at [facility], [place] [address], [place], [place] [address]    Special Requests   Final    BOTTLES DRAWN AEROBIC AND ANAEROBIC Blood Culture results may not be optimal due to an excessive volume of blood received in culture bottles Performed at [place] [address] [place],  [address]    Culture  Setup  Time   Final    GRAM NEGATIVE RODS IN BOTH AEROBIC AND ANAEROBIC BOTTLES CRITICAL VALUE NOTED.  VALUE IS CONSISTENT WITH PREVIOUSLY REPORTED AND CALLED VALUE.    Culture   Final    GRAM NEGATIVE RODS IDENTIFICATION TO FOLLOW Performed at [facility], [place] [address] [place], [place] [address]    Report Status PENDING  Incomplete  Urine culture     Status: None   Collection Time: 08/02/20  7:26 PM   Specimen: In/Out Cath Urine  Result  Value Ref Range Status   Specimen Description   Final    IN/OUT CATH URINE Performed at [place] [address] [place], [place] [address]    Special Requests   Final    NONE Performed at [facility], [place] [address] [place], [place] [address]    Culture   Final    NO GROWTH Performed at [facility], [place] [address], [place], [place] [address]    Report Status 08/04/2020 FINAL  Final  Blood Culture (routine x 2)     Status: Abnormal (Preliminary result)   Collection Time: 08/02/20  7:31 PM   Specimen: BLOOD  Result Value Ref Range Status   Specimen Description   Final    BLOOD FEM LINE Performed at [facility], [place] [address] [place], [place] [address]    Special Requests   Final    BOTTLES DRAWN AEROBIC AND ANAEROBIC Blood Culture adequate volume Performed at [place] [address] [place], [place] [address]    Culture  Setup Time   Final    GRAM NEGATIVE RODS IN BOTH AEROBIC AND ANAEROBIC BOTTLES Organism ID to follow CRITICAL RESULT CALLED TO, READ BACK BY AND VERIFIED WITH: J. LEGGE PHARMD, AT 1300 08/03/20 BY D. VANHOOK    Culture (A)  Final    ENTEROBACTER CLOACAE SUSCEPTIBILITIES TO FOLLOW Performed at [facility], [place] [address], [place],  [address]    Report Status PENDING  Incomplete  Blood Culture ID Panel (Reflexed)     Status: Abnormal   Collection Time: 08/02/20  7:31 PM  Result Value Ref Range Status   Enterococcus faecalis NOT DETECTED NOT DETECTED Final   Enterococcus Faecium NOT DETECTED NOT DETECTED Final   Listeria monocytogenes NOT DETECTED NOT DETECTED Final   Staphylococcus species NOT DETECTED NOT DETECTED Final   Staphylococcus aureus (BCID) NOT DETECTED NOT DETECTED Final   Staphylococcus epidermidis NOT DETECTED NOT DETECTED Final   Staphylococcus lugdunensis NOT DETECTED NOT DETECTED Final   Streptococcus species NOT DETECTED NOT DETECTED Final     Streptococcus agalactiae NOT DETECTED NOT DETECTED Final   Streptococcus pneumoniae NOT DETECTED NOT DETECTED Final   Streptococcus pyogenes NOT DETECTED NOT DETECTED Final   A.calcoaceticus-baumannii NOT DETECTED NOT DETECTED Final   Bacteroides fragilis NOT DETECTED NOT DETECTED Final   Enterobacterales DETECTED (A) NOT DETECTED Final    Comment: Enterobacterales represent a large order of gram negative bacteria, not a single organism. CRITICAL RESULT CALLED TO, READ BACK BY AND VERIFIED WITH: J. LEGGE PHARMD, AT 1300 08/03/20 BY D. VANHOOK    Enterobacter cloacae complex DETECTED (A) NOT DETECTED Final    Comment: CRITICAL RESULT CALLED TO, READ BACK BY AND VERIFIED WITH: J. LEGGE PHARMD, AT 1300 08/03/20 BY D. VANHOOK    Escherichia coli NOT DETECTED NOT DETECTED Final   Klebsiella aerogenes NOT DETECTED NOT DETECTED Final   Klebsiella oxytoca NOT  DETECTED NOT DETECTED Final   Klebsiella pneumoniae NOT DETECTED NOT DETECTED Final   Proteus species NOT DETECTED NOT DETECTED Final   Salmonella species NOT DETECTED NOT DETECTED Final   Serratia marcescens NOT DETECTED NOT DETECTED Final   Haemophilus influenzae NOT DETECTED NOT DETECTED Final   Neisseria meningitidis NOT DETECTED NOT DETECTED Final   Pseudomonas aeruginosa NOT DETECTED NOT DETECTED Final   Stenotrophomonas maltophilia NOT DETECTED NOT DETECTED Final   Candida albicans NOT DETECTED NOT DETECTED Final   Candida auris NOT DETECTED NOT DETECTED Final   Candida glabrata NOT DETECTED NOT DETECTED Final   Candida krusei NOT DETECTED NOT DETECTED Final   Candida parapsilosis NOT DETECTED NOT DETECTED Final   Candida tropicalis NOT DETECTED NOT DETECTED Final   Cryptococcus neoformans/gattii NOT DETECTED NOT DETECTED Final   CTX-M ESBL NOT DETECTED NOT DETECTED Final   Carbapenem resistance IMP NOT DETECTED NOT DETECTED Final   Carbapenem resistance KPC NOT DETECTED NOT DETECTED Final   Carbapenem resistance NDM NOT  DETECTED NOT DETECTED Final   Carbapenem resist OXA 48 LIKE NOT DETECTED NOT DETECTED Final   Carbapenem resistance VIM NOT DETECTED NOT DETECTED Final    Comment: Performed at [facility], [address] [place], [place] [address]  SARS Coronavirus 2 by RT PCR (hospital order, performed in [facility] hospital lab) Nasopharyngeal Nasopharyngeal Swab     Status: None   Collection Time: 08/02/20 10:40 PM   Specimen: Nasopharyngeal Swab  Result Value Ref Range Status   SARS Coronavirus 2 NEGATIVE NEGATIVE Final    Comment: (NOTE) SARS-CoV-2 target nucleic acids are NOT DETECTED.  The SARS-CoV-2 RNA is generally detectable in upper and lower respiratory specimens during the acute phase of infection. The lowest concentration of SARS-CoV-2 viral copies this assay can detect is 250 copies / mL. A negative result does not preclude SARS-CoV-2 infection and should not be used as the sole basis for treatment or other patient management decisions.  A negative result may occur with improper specimen collection / handling, submission of specimen other than nasopharyngeal swab, presence of viral mutation(s) within the areas targeted by this assay, and inadequate number of viral copies (<250 copies / mL). A negative result must be combined with clinical observations, patient history, and epidemiological information.  Fact Sheet for Patients:   StrictlyIdeas.no  Fact Sheet for Healthcare Providers: BankingDealers.co.za  This test is not yet approved or  cleared by the [place] FDA and has been authorized for detection and/or diagnosis of SARS-CoV-2 by FDA under an Emergency Use Authorization (EUA).  This EUA will remain in effect (meaning this test can be used) for the duration of the COVID-19 declaration under Section 564(b)(1) of the Act, 21 U.S.C. section 360bbb-3(b)(1), unless the authorization is terminated or revoked  sooner.  Performed at [facility], [place] [address], [place], [place] [address]     Alcide Evener, [employer] [facility] [phone] pager  08/04/2020, 1:37 PM

## 2020-08-04 NOTE — Progress Notes (Signed)
Patient ID: Caitlin Peters, female   DOB: [birth year], 59 y.o.   MRN: [id number]  PROGRESS NOTE    Caitlin Peters  AGT:[id number] DOB: 10/08/1961 DOA: 08/02/2020 PCP: [name], [name], MD   Brief Narrative:  59 y.o. female with medical history significant of chronic diarrhea, hypertension, CKD stage III, chronic anemia, Crohn's disease, short gut syndrome/TPN dependent, chronic pain on methadone, malnutrition, anxiety, depression, tobacco use, recent hospital admission for Enterobacter and stenotrophomonas sepsis in the setting of long-term PICC line in the left thigh which was treated with IV antibiotics and subsequently oral Bactrim on discharge till 08/02/2020 presented on 08/02/2020 with fever.  On presentation, she was febrile with temperature of 100.8 along with tachycardia with hypotension along with leukocytosis of 18.5 creatinine of 2.5.  COVID-19 testing was negative.  Chest x-ray was negative for infiltrates.  CT of the abdomen and pelvis did not show any obvious infectious source.  She was started on broad-spectrum antibiotics and IV fluids  Assessment & Plan:   Sepsis: Present on admission Enterobacter bacteremia -Patient was recently treated for stenotrophomonas and Enterobacter bacteremia, most likely related to her chronic femoral PICC line.  She received IV antibiotics and was discharged on oral Bactrim till 08/02/2020 but subsequently presented with fever the same day. -Currently has Enterobacter bacteremia.  Broad-spectrum antibiotics have been switched to IV cefepime.  Hemodynamically improving. -Consulted ID/Dr. Tommy Medal due to patient having recurrent bacteremia and also having chronic PICC.  Patient also recommending ID reevaluation  AKI on chronic kidney disease stage IIIb Anion gap metabolic acidosis -Probably from prerenal azotemia from dehydration/sepsis.  Presented with creatinine of 2.5.  Baseline creatinine 1.6-1.7.  CT without evidence of obstructive uropathy.   Creatinine 2.17 today and improving.  Treated with IV fluids using normal saline at 75 cc an hour -Acidosis is improving  Anemia of chronic disease -Probably from chronic kidney disease.  Hemoglobin 7.3 today.  No signs of bleeding.  Transfuse if hemoglobin is less than 7  Thrombocytopenia -Questionable cause.  Monitor.  No signs of bleeding  Crohn's disease/short gut syndrome -Continue budesonide.   will hold TPN as patient is currently bacteremic.  Follow ID recommendations regarding her chronic femoral PICC  Chronic pain syndrome -Continue scheduled home methadone and as needed Dilaudid for breakthrough pain  Hypertension -Antihypertensives on hold.  Will resume in a.m. if blood pressures remains high   DVT prophylaxis: Subcutaneous heparin Code Status: Full Family Communication: None at bedside Disposition Plan: Status is: Inpatient  Remains inpatient appropriate because:Inpatient level of care appropriate due to severity of illness.  Currently bacteremic requiring IV antibiotics   Dispo: The patient is from: Home              Anticipated d/c is to: Home              Anticipated d/c date is: 2 days              Patient currently is not medically stable to d/c.   Consultants: ID  Procedures: None  Antimicrobials:  Anti-infectives (From admission, onward)   Start     Dose/Rate Route Frequency Ordered Stop   08/04/20 2200  vancomycin (VANCOCIN) IVPB 1000 mg/200 mL premix  Status:  Discontinued        1,000 mg 200 mL/hr over 60 Minutes Intravenous Every 48 hours 08/03/20 0341 08/03/20 1514   08/03/20 1400  ceFEPIme (MAXIPIME) 2 g in sodium chloride 0.9 % 100 mL IVPB  2 g 200 mL/hr over 30 Minutes Intravenous Every 24 hours 08/03/20 1343     08/03/20 1200  cefTAZidime (FORTAZ) 2 g in sodium chloride 0.9 % 100 mL IVPB  Status:  Discontinued        2 g 200 mL/hr over 30 Minutes Intravenous Every 12 hours 08/03/20 1044 08/03/20 1330   08/03/20 1000   piperacillin-tazobactam (ZOSYN) IVPB 3.375 g  Status:  Discontinued        3.375 g 12.5 mL/hr over 240 Minutes Intravenous Every 8 hours 08/03/20 0341 08/03/20 1044   08/03/20 0400  piperacillin-tazobactam (ZOSYN) IVPB 3.375 g        3.375 g 100 mL/hr over 30 Minutes Intravenous  Once 08/03/20 0352 08/03/20 0408   08/03/20 0345  piperacillin-tazobactam (ZOSYN) IVPB 3.375 g  Status:  Discontinued        3.375 g 12.5 mL/hr over 240 Minutes Intravenous  Once 08/03/20 0341 08/03/20 0351   08/03/20 0015  vancomycin (VANCOREADY) IVPB 1250 mg/250 mL        1,250 mg 166.7 mL/hr over 90 Minutes Intravenous  Once 08/03/20 0002 08/03/20 0257   08/02/20 1930  metroNIDAZOLE (FLAGYL) IVPB 500 mg        500 mg 100 mL/hr over 60 Minutes Intravenous  Once 08/02/20 1928 08/02/20 2311   08/02/20 1930  cefTRIAXone (ROCEPHIN) 1 g in sodium chloride 0.9 % 100 mL IVPB        1 g 200 mL/hr over 30 Minutes Intravenous  Once 08/02/20 1928 08/02/20 2200       Subjective: Patient seen and examined at bedside.  Feels weak but denies overnight fever, worsening abdominal pain or vomiting.  Tolerating diet.  Objective: Vitals:   08/04/20 0856 08/04/20 0906 08/04/20 0937 08/04/20 1017  BP: (!) 154/69 140/76 140/76 (!) 150/75  Pulse: 80 77 80 80  Resp: 13 14 14 15   Temp:  (!) 97.5 F (36.4 C)    TempSrc:  Oral    SpO2: 97% 98% 97% 97%    Intake/Output Summary (Last 24 hours) at 08/04/2020 1045 Last data filed at 08/03/2020 1212 Gross per 24 hour  Intake 871.67 ml  Output --  Net 871.67 ml   There were no vitals filed for this visit.  Examination:  General exam: Appears calm and comfortable.  Looks chronically ill. Respiratory system: Bilateral decreased breath sounds at bases Cardiovascular system: S1 & S2 heard, Rate controlled Gastrointestinal system: Abdomen is nondistended, soft and mildly tender in the lower quadrant. Normal bowel sounds heard. Extremities: No cyanosis, clubbing, edema  Central  nervous system: Alert and oriented. No focal neurological deficits. Moving extremities Skin: No rashes, lesions or ulcers.  Left femoral line with no surrounding erythema seen Psychiatry: Judgement and insight appear normal. Mood & affect appropriate.     Data Reviewed: I have personally reviewed following labs and imaging studies  CBC: Recent Labs  Lab 08/02/20 2020 08/03/20 0434 08/04/20 0521  WBC 18.5* 19.8* 7.2  NEUTROABS 17.2*  --  5.0  HGB 8.1* 7.9* 7.3*  HCT 24.0* 24.9* 22.9*  MCV 92.3 94.0 95.0  PLT 146* 128* 277*   Basic Metabolic Panel: Recent Labs  Lab 08/02/20 1856 08/03/20 0434 08/04/20 0521  NA 135 136 134*  K 4.3 4.6 4.4  CL 103 102 108  CO2 16* 20* 20*  GLUCOSE 82 100* 98  BUN 46* 44* 28*  CREATININE 2.56* 2.29*  2.19* 2.17*  CALCIUM 9.2 9.2 8.9  MG  --  1.8 2.0  PHOS  --  3.7 3.3   GFR: CrCl cannot be calculated (Unknown ideal weight.). Liver Function Tests: Recent Labs  Lab 08/02/20 1856 08/03/20 0434 08/04/20 0521  AST 78* 68* 36  ALT 80* 79* 55*  ALKPHOS 360* 314* 259*  BILITOT 1.4* 2.0* 0.9  PROT 7.7 6.7 6.6  ALBUMIN 3.2* 2.8* 2.6*   Recent Labs  Lab 08/02/20 2020  LIPASE 24   No results for input(s): AMMONIA in the last 168 hours. Coagulation Profile: Recent Labs  Lab 08/02/20 2020  INR 1.1   Cardiac Enzymes: No results for input(s): CKTOTAL, CKMB, CKMBINDEX, TROPONINI in the last 168 hours. BNP (last 3 results) No results for input(s): PROBNP in the last 8760 hours. HbA1C: No results for input(s): HGBA1C in the last 72 hours. CBG: No results for input(s): GLUCAP in the last 168 hours. Lipid Profile: No results for input(s): CHOL, HDL, LDLCALC, TRIG, CHOLHDL, LDLDIRECT in the last 72 hours. Thyroid Function Tests: No results for input(s): TSH, T4TOTAL, FREET4, T3FREE, THYROIDAB in the last 72 hours. Anemia Panel: No results for input(s): VITAMINB12, FOLATE, FERRITIN, TIBC, IRON, RETICCTPCT in the last 72  hours. Sepsis Labs: Recent Labs  Lab 08/02/20 1856 08/02/20 2020 08/04/20 0521  PROCALCITON  --   --  43.60  LATICACIDVEN 1.5 1.0  --     Recent Results (from the past 240 hour(s))  Blood Culture (routine x 2)     Status: None (Preliminary result)   Collection Time: 08/02/20  7:26 PM   Specimen: BLOOD  Result Value Ref Range Status   Specimen Description   Final    BLOOD BLOOD RIGHT ARM Performed at [facility] [address] [place], [place] [address]    Special Requests   Final    BOTTLES DRAWN AEROBIC AND ANAEROBIC Blood Culture results may not be optimal due to an excessive volume of blood received in culture bottles Performed at [facility] [address], [place], [place] [address]    Culture  Setup Time   Final    GRAM NEGATIVE RODS IN BOTH AEROBIC AND ANAEROBIC BOTTLES CRITICAL VALUE NOTED.  VALUE IS CONSISTENT WITH PREVIOUSLY REPORTED AND CALLED VALUE.    Culture   Final    GRAM NEGATIVE RODS IDENTIFICATION TO FOLLOW Performed at [facility], [place] [address] [place], [place] [address]    Report Status PENDING  Incomplete  Urine culture     Status: None   Collection Time: 08/02/20  7:26 PM   Specimen: In/Out Cath Urine  Result Value Ref Range Status   Specimen Description   Final    IN/OUT CATH URINE Performed at [place] [address], [place], [place] [address]    Special Requests   Final    NONE Performed at [facility], [place] [address] [place], [place] [address]    Culture   Final    NO GROWTH Performed at [facility], [place] [address] [place], [place] [address]    Report Status 08/04/2020 FINAL  Final  Blood Culture (routine x 2)     Status: Abnormal (Preliminary result)   Collection Time: 08/02/20  7:31 PM   Specimen: BLOOD  Result Value Ref Range Status   Specimen Description   Final    BLOOD FEM LINE Performed at [facility], [place] [address], [place], [place] [address]    Special Requests   Final    BOTTLES DRAWN  AEROBIC AND ANAEROBIC Blood Culture adequate volume Performed at [facility] [address], [place], [place] [address]    Culture  Setup Time   Final    GRAM NEGATIVE RODS IN BOTH AEROBIC AND ANAEROBIC BOTTLES Organism ID to follow CRITICAL RESULT CALLED TO, READ BACK BY AND VERIFIED WITH: J. LEGGE PHARMD, AT 1300 08/03/20 BY D. VANHOOK    Culture (A)  Final    ENTEROBACTER CLOACAE SUSCEPTIBILITIES TO FOLLOW Performed at [facility], [place] [address], [place], [place] [address]    Report Status PENDING  Incomplete  Blood Culture ID Panel (Reflexed)     Status: Abnormal   Collection Time: 08/02/20  7:31 PM  Result Value Ref Range Status   Enterococcus faecalis NOT DETECTED NOT DETECTED Final   Enterococcus Faecium NOT DETECTED NOT DETECTED Final   Listeria monocytogenes NOT DETECTED NOT DETECTED Final   Staphylococcus species NOT DETECTED NOT DETECTED Final   Staphylococcus aureus (BCID) NOT DETECTED NOT DETECTED Final   Staphylococcus epidermidis NOT DETECTED NOT DETECTED Final   Staphylococcus lugdunensis NOT DETECTED NOT DETECTED Final   Streptococcus species NOT DETECTED NOT DETECTED Final   Streptococcus agalactiae NOT DETECTED NOT DETECTED Final   Streptococcus pneumoniae NOT DETECTED NOT DETECTED Final   Streptococcus pyogenes NOT DETECTED NOT DETECTED Final   A.calcoaceticus-baumannii NOT DETECTED NOT DETECTED Final   Bacteroides fragilis NOT DETECTED NOT DETECTED Final   Enterobacterales DETECTED (A) NOT DETECTED Final    Comment: Enterobacterales represent a large order of gram negative bacteria, not a single organism. CRITICAL RESULT CALLED TO, READ BACK BY AND VERIFIED WITH: J. LEGGE PHARMD, AT 1300 08/03/20 BY D. VANHOOK    Enterobacter cloacae complex DETECTED (A) NOT DETECTED Final    Comment: CRITICAL RESULT CALLED TO, READ BACK BY AND VERIFIED WITH: J. LEGGE  PHARMD, AT 1300 08/03/20 BY D. VANHOOK    Escherichia coli NOT DETECTED NOT DETECTED Final   Klebsiella aerogenes NOT DETECTED NOT DETECTED Final   Klebsiella oxytoca NOT DETECTED NOT DETECTED Final   Klebsiella pneumoniae NOT DETECTED NOT DETECTED Final   Proteus species NOT DETECTED NOT DETECTED Final   Salmonella species NOT DETECTED NOT DETECTED Final   Serratia marcescens NOT DETECTED NOT DETECTED Final   Haemophilus influenzae NOT DETECTED NOT DETECTED Final   Neisseria meningitidis NOT DETECTED NOT DETECTED Final   Pseudomonas aeruginosa NOT DETECTED NOT DETECTED Final   Stenotrophomonas maltophilia NOT DETECTED NOT DETECTED Final   Candida albicans NOT DETECTED NOT DETECTED Final   Candida auris NOT DETECTED NOT DETECTED Final   Candida glabrata NOT DETECTED NOT DETECTED Final   Candida krusei NOT DETECTED NOT DETECTED Final   Candida parapsilosis NOT DETECTED NOT DETECTED Final   Candida tropicalis NOT DETECTED NOT DETECTED Final   Cryptococcus neoformans/gattii NOT DETECTED NOT DETECTED Final   CTX-M ESBL NOT DETECTED NOT DETECTED Final   Carbapenem resistance IMP NOT DETECTED NOT DETECTED Final   Carbapenem resistance KPC NOT DETECTED NOT DETECTED Final   Carbapenem resistance NDM NOT DETECTED NOT DETECTED Final   Carbapenem resist OXA 48 LIKE NOT DETECTED NOT DETECTED Final   Carbapenem resistance VIM NOT DETECTED NOT DETECTED Final    Comment: Performed at [facility], [address] [place], [place] [address]  SARS Coronavirus 2 by RT PCR (hospital order, performed in [facility] hospital lab) Nasopharyngeal Nasopharyngeal Swab     Status: None   Collection Time: 08/02/20 10:40 PM   Specimen: Nasopharyngeal Swab  Result Value Ref Range Status   SARS Coronavirus 2 NEGATIVE NEGATIVE Final    Comment: (NOTE) SARS-CoV-2 target nucleic acids are NOT DETECTED.  The SARS-CoV-2 RNA is generally detectable in upper and lower respiratory specimens during the acute phase  of infection. The lowest concentration of SARS-CoV-2 viral copies this assay can detect is 250 copies / mL. A negative result does not preclude SARS-CoV-2 infection and should not be used as the sole basis for treatment or other patient management decisions.  A negative result may occur with improper specimen collection / handling, submission of specimen other than nasopharyngeal swab, presence of viral mutation(s) within the areas targeted by this assay, and inadequate number of viral copies (<250 copies / mL). A negative result must be combined with clinical observations, patient history, and epidemiological information.  Fact Sheet for Patients:   StrictlyIdeas.no  Fact Sheet for Healthcare Providers: BankingDealers.co.za  This test is not yet approved or  cleared by the [place] FDA and has been authorized for detection and/or diagnosis of SARS-CoV-2 by FDA under an Emergency Use Authorization (EUA).  This EUA will remain in effect (meaning this test can be used) for the duration of the COVID-19 declaration under Section 564(b)(1) of the Act, 21 U.S.C. section 360bbb-3(b)(1), unless the authorization is terminated or revoked sooner.  Performed at [facility], [place] [address] [place], [place] [address]          Radiology Studies: CT ABDOMEN PELVIS WO CONTRAST  Result Date: 08/02/2020 CLINICAL DATA:  Fever EXAM: CT ABDOMEN AND PELVIS WITHOUT CONTRAST TECHNIQUE: Multidetector CT imaging of the abdomen and pelvis was performed following the standard protocol without IV contrast. COMPARISON:  CT 07/19/2020, 06/27/2020, 05/05/2020, 04/13/2020 FINDINGS: Lower chest: Lung bases demonstrate no acute consolidation or effusion. Borderline to mild cardiomegaly. Hepatobiliary: No focal hepatic abnormality. Small amount of pneumobilia, chronic finding. Status post cholecystectomy. Pancreas: No inflammatory change.   Minimal ductal prominence. Spleen: Normal in size without focal abnormality. Adrenals/Urinary Tract: Adrenal glands are unremarkable. Kidneys are normal, without renal calculi, focal lesion, or hydronephrosis. Bladder is unremarkable. Stomach/Bowel: Contrast within the stomach and small bowel. Contrast is also reached the distal colon. No obstructive changes are identified. Evidence of prior bowel resection. No acute bowel wall thickening. Vascular/Lymphatic: Left lower extremity central venous catheter with tip in the intra hepatic IVC. No suspicious adenopathy. Reproductive: Status post hysterectomy. No adnexal masses. Other: Negative for free air or free fluid. Musculoskeletal: No acute osseous abnormality. Stable focal sclerosis in the left iliac bone. IMPRESSION: No CT evidence for acute intra-abdominal or pelvic abnormality. Evidence of prior bowel resection without evidence for obstruction or acute bowel wall thickening. Status post cholecystectomy. Trace amount of pneumobilia, chronic finding. Left lower extremity central venous catheter with tip in the intra hepatic IVC. Electronically Signed   By: Donavan Foil M.D.   On: 08/02/2020 23:00   DG Chest Port 1 View  Result Date: 08/02/2020 CLINICAL DATA:  Fever, sepsis EXAM: PORTABLE CHEST 1 VIEW COMPARISON:  07/19/2020 FINDINGS: Single frontal view of the chest demonstrates a stable cardiac silhouette. No airspace disease, effusion, or pneumothorax. No acute bony abnormalities. Surgical clips right hilum unchanged. IMPRESSION: 1. No acute intrathoracic process. Electronically Signed   By: Randa Ngo M.D.   On: 08/02/2020 20:00        Scheduled Meds: . budesonide  3 mg Oral Daily  . buPROPion  200 mg Oral Daily  . cholecalciferol  1,000 Units Oral Daily  . cycloSPORINE  1 drop Both Eyes BID  . DULoxetine  90 mg Oral Daily  . estradiol  2 mg Oral Daily  . heparin  5,000 Units Subcutaneous Q8H  . methadone  5 mg Oral 5 X Daily  . omega-3  acid ethyl esters  1,000 mg Oral Daily  . pantoprazole  40 mg Oral Daily  . sucralfate  1 g Oral TID WC & HS  . vitamin B-12  1,000 mcg Oral Daily   Continuous Infusions: . ceFEPime (MAXIPIME) IV 2 g (08/03/20 1414)          Aline August, MD [facility] 08/04/2020, 10:45 AM

## 2020-08-05 ENCOUNTER — Other Ambulatory Visit: Payer: Self-pay | Admitting: Internal Medicine

## 2020-08-05 ENCOUNTER — Inpatient Hospital Stay: Payer: Self-pay | Admitting: Internal Medicine

## 2020-08-05 ENCOUNTER — Inpatient Hospital Stay (HOSPITAL_COMMUNITY): Payer: Medicare Other

## 2020-08-05 DIAGNOSIS — T80219D Unspecified infection due to central venous catheter, subsequent encounter: Secondary | ICD-10-CM

## 2020-08-05 HISTORY — PX: IR FLUORO GUIDE CV LINE RIGHT: IMG2283

## 2020-08-05 HISTORY — PX: IR REMOVAL TUN CV CATH W/O FL: IMG2289

## 2020-08-05 HISTORY — PX: IR US GUIDE VASC ACCESS RIGHT: IMG2390

## 2020-08-05 LAB — COMPREHENSIVE METABOLIC PANEL
ALT: 46 U/L — ABNORMAL HIGH (ref 0–44)
AST: 33 U/L (ref 15–41)
Albumin: 2.8 g/dL — ABNORMAL LOW (ref 3.5–5.0)
Alkaline Phosphatase: 238 U/L — ABNORMAL HIGH (ref 38–126)
Anion gap: 14 (ref 5–15)
BUN: 19 mg/dL (ref 6–20)
CO2: 14 mmol/L — ABNORMAL LOW (ref 22–32)
Calcium: 9.4 mg/dL (ref 8.9–10.3)
Chloride: 110 mmol/L (ref 98–111)
Creatinine, Ser: 1.69 mg/dL — ABNORMAL HIGH (ref 0.44–1.00)
GFR calc Af Amer: 38 mL/min — ABNORMAL LOW (ref >60)
GFR calc non Af Amer: 33 mL/min — ABNORMAL LOW (ref >60)
Glucose, Bld: 98 mg/dL (ref 70–99)
Potassium: 4.2 mmol/L (ref 3.5–5.1)
Sodium: 138 mmol/L (ref 135–145)
Total Bilirubin: 0.8 mg/dL (ref 0.3–1.2)
Total Protein: 6.6 g/dL (ref 6.5–8.1)

## 2020-08-05 LAB — CULTURE, BLOOD (ROUTINE X 2): Special Requests: ADEQUATE

## 2020-08-05 LAB — CBC WITH DIFFERENTIAL/PLATELET
Abs Immature Granulocytes: 0.01 10*3/uL (ref 0.00–0.07)
Basophils Absolute: 0 10*3/uL (ref 0.0–0.1)
Basophils Relative: 0 %
Eosinophils Absolute: 0 10*3/uL (ref 0.0–0.5)
Eosinophils Relative: 1 %
HCT: 23.7 % — ABNORMAL LOW (ref 36.0–46.0)
Hemoglobin: 7.7 g/dL — ABNORMAL LOW (ref 12.0–15.0)
Immature Granulocytes: 0 %
Lymphocytes Relative: 41 %
Lymphs Abs: 1.4 10*3/uL (ref 0.7–4.0)
MCH: 30.4 pg (ref 26.0–34.0)
MCHC: 32.5 g/dL (ref 30.0–36.0)
MCV: 93.7 fL (ref 80.0–100.0)
Monocytes Absolute: 0.3 10*3/uL (ref 0.1–1.0)
Monocytes Relative: 8 %
Neutro Abs: 1.7 10*3/uL (ref 1.7–7.7)
Neutrophils Relative %: 50 %
Platelets: 147 10*3/uL — ABNORMAL LOW (ref 150–400)
RBC: 2.53 MIL/uL — ABNORMAL LOW (ref 3.87–5.11)
RDW: 14.7 % (ref 11.5–15.5)
WBC: 3.4 10*3/uL — ABNORMAL LOW (ref 4.0–10.5)
nRBC: 0 % (ref 0.0–0.2)

## 2020-08-05 LAB — MAGNESIUM: Magnesium: 1.7 mg/dL (ref 1.7–2.4)

## 2020-08-05 MED ORDER — SODIUM CHLORIDE 0.9% FLUSH
10.0000 mL | INTRAVENOUS | Status: DC | PRN
  Administered 2020-08-09: 10 mL

## 2020-08-05 MED ORDER — HEPARIN SODIUM (PORCINE) 5000 UNIT/ML IJ SOLN
5000.0000 [IU] | Freq: Three times a day (TID) | INTRAMUSCULAR | Status: DC
  Administered 2020-08-05 – 2020-08-12 (×17): 5000 [IU] via SUBCUTANEOUS
  Filled 2020-08-05 (×18): qty 1

## 2020-08-05 MED ORDER — HYDROMORPHONE HCL 4 MG PO TABS
4.0000 mg | ORAL_TABLET | Freq: Four times a day (QID) | ORAL | Status: DC | PRN
  Administered 2020-08-05 – 2020-08-07 (×6): 4 mg via ORAL
  Filled 2020-08-05 (×7): qty 1

## 2020-08-05 MED ORDER — CHLORHEXIDINE GLUCONATE CLOTH 2 % EX PADS
6.0000 | MEDICATED_PAD | Freq: Every day | CUTANEOUS | Status: DC
  Administered 2020-08-06 – 2020-08-12 (×7): 6 via TOPICAL

## 2020-08-05 MED ORDER — MIDAZOLAM HCL 2 MG/2ML IJ SOLN
INTRAMUSCULAR | Status: AC
  Filled 2020-08-05: qty 2

## 2020-08-05 MED ORDER — LIDOCAINE-EPINEPHRINE 1 %-1:100000 IJ SOLN
INTRAMUSCULAR | Status: AC | PRN
  Administered 2020-08-05: 10 mL

## 2020-08-05 MED ORDER — METOPROLOL TARTRATE 50 MG PO TABS
100.0000 mg | ORAL_TABLET | Freq: Two times a day (BID) | ORAL | Status: DC
  Administered 2020-08-05 – 2020-08-10 (×11): 100 mg via ORAL
  Administered 2020-08-11: 50 mg via ORAL
  Administered 2020-08-11 – 2020-08-12 (×2): 100 mg via ORAL
  Filled 2020-08-05 (×15): qty 2

## 2020-08-05 MED ORDER — FENTANYL CITRATE (PF) 100 MCG/2ML IJ SOLN
INTRAMUSCULAR | Status: AC
  Filled 2020-08-05: qty 2

## 2020-08-05 MED ORDER — AMLODIPINE BESYLATE 10 MG PO TABS
10.0000 mg | ORAL_TABLET | Freq: Every day | ORAL | Status: DC
  Administered 2020-08-05 – 2020-08-12 (×8): 10 mg via ORAL
  Filled 2020-08-05 (×8): qty 1

## 2020-08-05 MED ORDER — SIMETHICONE 80 MG PO CHEW: 80.0000 mg | CHEWABLE_TABLET | Freq: Four times a day (QID) | ORAL | Status: DC | PRN

## 2020-08-05 MED ORDER — PROMETHAZINE HCL 25 MG PO TABS: 25.0000 mg | ORAL_TABLET | Freq: Four times a day (QID) | ORAL | 0 refills | Status: DC | PRN

## 2020-08-05 MED ORDER — MIDAZOLAM HCL 2 MG/2ML IJ SOLN
INTRAMUSCULAR | Status: AC | PRN
  Administered 2020-08-05 (×2): 1 mg via INTRAVENOUS

## 2020-08-05 MED ORDER — LIDOCAINE-EPINEPHRINE (PF) 2 %-1:200000 IJ SOLN
INTRAMUSCULAR | Status: AC
  Filled 2020-08-05: qty 20

## 2020-08-05 MED ORDER — OXYCODONE HCL 5 MG PO TABS
5.0000 mg | ORAL_TABLET | ORAL | Status: DC | PRN
  Administered 2020-08-05: 5 mg via ORAL
  Filled 2020-08-05: qty 1

## 2020-08-05 MED ORDER — FENTANYL CITRATE (PF) 100 MCG/2ML IJ SOLN
INTRAMUSCULAR | Status: AC | PRN
  Administered 2020-08-05 (×2): 50 ug via INTRAVENOUS

## 2020-08-05 MED ORDER — SODIUM BICARBONATE 8.4 % IV SOLN
INTRAVENOUS | Status: DC
  Filled 2020-08-05: qty 850
  Filled 2020-08-05 (×2): qty 150

## 2020-08-05 MED ORDER — SODIUM CHLORIDE 0.9 % IV SOLN
2.0000 g | Freq: Two times a day (BID) | INTRAVENOUS | Status: DC
  Administered 2020-08-05 – 2020-08-09 (×9): 2 g via INTRAVENOUS
  Filled 2020-08-05 (×9): qty 2

## 2020-08-05 NOTE — Progress Notes (Signed)
Referring Physician(s): Joneen Caraway  Supervising Physician: Sandi Mariscal  Patient Status:  [facility] - In-pt  Chief Complaint: Recurrent bacteremia/poor venous access   Subjective: Patient familiar to our service from prior venous access procedures, latest on 07/20/20 which was exchange of tunneled left femoral central venous catheter.  She has a history of Crohn's disease with short gut syndrome and is chronically dependent upon TPN.  She has had numerous long-term central venous access procedures dating back to 2003 and reportedly occluded in her arms and neck regions bilaterally. She currently has a chronic indwelling left femoral tunneled PICC which gets exchanged whenever she develops bacteremia.  She presented to [facility] ED on 9/7 with fevers, abd pain, nausea despite OP antbx therapy and blood cultures revealing enterobacterales and enterobacter cloacae complex.  Latest WBC normal.  COVID-19 negative.  Request again received from ID service for removal of existing left femoral tunneled catheter and placement of new right femoral tunneled catheter if possible.  Patient currently denies fever, headache, chest pain, dyspnea, cough, back pain, nausea, vomiting or bleeding.  She does have some mild diffuse abdominal discomfort.  Past Medical History:  Diagnosis Date  . Acute pancreatitis 04/13/2020  . Anasarca 10/2019  . AVN (avascular necrosis of bone) ([place])   . Cataract   . Chronic pain syndrome   . CKD (chronic kidney disease), stage III   . Crohn disease ([place])   . Crohn disease ([place])   . Depression   . Diverticulosis   . GERD (gastroesophageal reflux disease)   . HTN (hypertension)   . IDA (iron deficiency anemia)   . Malnutrition ([place])   . Mass in chest   . Osteoporosis   . Pancreatitis   . Short gut syndrome   . Vitamin B12 deficiency    Past Surgical History:  Procedure Laterality Date  . ABDOMINAL HYSTERECTOMY    . ABDOMINAL SURGERY     colon resection with abdominal  stoma, repair of intestinal leak, reversal of abdominal stoma  . BILIARY DILATION  11/26/2019   Procedure: BILIARY DILATION;  Surgeon: Jackquline Denmark, MD;  Location: [facility];  Service: Endoscopy;;  . BILIARY DILATION  03/08/2020   Procedure: BILIARY DILATION;  Surgeon: Irving Copas., MD;  Location: [place];  Service: Gastroenterology;;  . BIOPSY  03/08/2020   Procedure: BIOPSY;  Surgeon: Irving Copas., MD;  Location: [place];  Service: Gastroenterology;;  . CHEST WALL RESECTION     right thoracotomy,resection of chest mass with anterior rib and reconstruction using prosthetic mesh and video arthroscopy  . CHOLECYSTECTOMY    . COLONOSCOPY  2019  . ERCP N/A 11/26/2019   Procedure: ENDOSCOPIC RETROGRADE CHOLANGIOPANCREATOGRAPHY (ERCP);  Surgeon: Jackquline Denmark, MD;  Location: [facility];  Service: Endoscopy;  Laterality: N/A;  . ERCP N/A 03/08/2020   Procedure: ENDOSCOPIC RETROGRADE CHOLANGIOPANCREATOGRAPHY (ERCP);  Surgeon: Irving Copas., MD;  Location: [place];  Service: Gastroenterology;  Laterality: N/A;  . ESOPHAGOGASTRODUODENOSCOPY N/A 03/08/2020   Procedure: ESOPHAGOGASTRODUODENOSCOPY (EGD);  Surgeon: Irving Copas., MD;  Location: [place];  Service: Gastroenterology;  Laterality: N/A;  . EUS N/A 03/08/2020   Procedure: UPPER ENDOSCOPIC ULTRASOUND (EUS) LINEAR;  Surgeon: Irving Copas., MD;  Location: [place];  Service: Gastroenterology;  Laterality: N/A;  . IR FLUORO GUIDE CV LINE LEFT  01/07/2020  . IR FLUORO GUIDE CV LINE LEFT  03/09/2020  . IR FLUORO GUIDE CV LINE LEFT  05/09/2020  . IR FLUORO GUIDE CV LINE LEFT  07/20/2020  .  IR PTA VENOUS EXCEPT DIALYSIS CIRCUIT  01/07/2020  . IR US GUIDE VASC ACCESS LEFT     x 2 06/17/19 and 09/14/2019  . KNEE SURGERY     right knee   . REMOVAL OF STONES  11/26/2019   Procedure: REMOVAL OF STONES;  Surgeon: Jackquline Denmark, MD;  Location: [facility];  Service: Endoscopy;;  .  REMOVAL OF STONES  03/08/2020   Procedure: REMOVAL OF STONES;  Surgeon: Irving Copas., MD;  Location: [place];  Service: Gastroenterology;;  . SMALL INTESTINE SURGERY     x3  . SPHINCTEROTOMY  11/26/2019   Procedure: SPHINCTEROTOMY;  Surgeon: Jackquline Denmark, MD;  Location: [facility];  Service: Endoscopy;;  . UPPER GASTROINTESTINAL ENDOSCOPY       Allergies: Cefepime, Gabapentin, Hyoscyamine, Lyrica [pregabalin], Meperidine, Topamax [topiramate], Zosyn [piperacillin sod-tazobactam so], Fentanyl, and Morphine and related  Medications: Prior to Admission medications   Medication Sig Start Date End Date Taking? Authorizing Provider  acetaminophen (TYLENOL) 325 MG tablet Take 650 mg by mouth every 6 (six) hours as needed for mild pain.    Yes [provider]  amLODipine (NORVASC) 10 MG tablet Take 1 tablet (10 mg total) by mouth daily. 06/23/20 08/02/20 Yes Erline Hau, MD  budesonide (ENTOCORT EC) 3 MG 24 hr capsule TAKE 3 CAPSULES BY MOUTH ONCE DAILY Patient taking differently: Take 3 mg by mouth daily.  04/05/20  Yes [name], [name], MD  buPROPion [facility] SR) 100 MG 12 hr tablet Take 2 tablets by mouth once daily Patient taking differently: Take 200 mg by mouth daily.  07/04/20  Yes [name], [name], MD  calcium carbonate (TUMS - DOSED IN MG ELEMENTAL CALCIUM) 500 MG chewable tablet Chew 1 tablet by mouth 3 (three) times daily as needed for indigestion or heartburn.   Yes [provider]  cholecalciferol (VITAMIN D3) 25 MCG (1000 UT) tablet Take 1,000 Units by mouth daily.    Yes [provider]  cycloSPORINE (RESTASIS) 0.05 % ophthalmic emulsion Place 1 drop into both eyes 2 (two) times daily.   Yes [provider]  dexlansoprazole (DEXILANT) 60 MG capsule Take 1 capsule (60 mg total) by mouth daily. 06/28/20  Yes [name], [name], MD  Dextran 70-Hypromellose 0.1-0.3 % SOLN Place 1 drop into both  eyes 4 (four) times daily.   Yes [provider]  dicyclomine (BENTYL) 20 MG tablet Take 20 mg by mouth 3 (three) times daily. 10/26/19  Yes [provider]  diphenoxylate-atropine (LOMOTIL) 2.5-0.025 MG tablet Take 1 tablet by mouth 4 (four) times daily as needed for diarrhea or loose stools. 05/24/20  Yes [name], [name], MD  DULoxetine (CYMBALTA) 30 MG capsule Take 3 capsules (90 mg total) by mouth daily. 05/24/20  Yes [name], [name], MD  estradiol (ESTRACE) 2 MG tablet Take 1 tablet (2 mg total) by mouth daily. 06/28/20  Yes [name], [name], MD  famotidine (PEPCID) 20 MG tablet Take 20 mg by mouth at bedtime.   Yes [provider]  hydrALAZINE (APRESOLINE) 50 MG tablet Take 1 tablet (50 mg total) by mouth 3 (three) times daily. 06/28/20  Yes [name], [name], MD  HYDROmorphone (DILAUDID) 4 MG tablet Take 1 tablet (4 mg total) by mouth every 6 (six) hours as needed for severe pain. 06/30/20  Yes Florencia Reasons, MD  lipase/protease/amylase (CREON) 36000 UNITS CPEP capsule Take 1 capsule (36,000 Units total) by mouth 3 (three) times daily as  needed (with meals for digestion). 06/28/20  Yes [name], [name], MD  loperamide (IMODIUM) 2 MG capsule TAKE 1 CAPSULE BY MOUTH AS NEEDED FOR DIARRHEA OR  LOOSE  STOOLS Patient taking differently: Take 2 mg by mouth as needed for diarrhea or loose stools.  07/29/20  Yes Erline Hau, MD  methadone (DOLOPHINE) 5 MG tablet Take 1 tablet (5 mg total) by mouth 5 (five) times daily. 06/30/20  Yes Florencia Reasons, MD  metoprolol tartrate (LOPRESSOR) 100 MG tablet Take 1 tablet (100 mg total) by mouth 2 (two) times daily. 06/28/20  Yes [name], [name], MD  Multiple Vitamins-Minerals (MULTIVITAMIN ADULT PO) Take 1 tablet by mouth daily.   Yes [provider]  Omega-3 1000 MG CAPS Take 1,000 mg by mouth daily.    Yes [provider]  PRESCRIPTION MEDICATION Inject 1 each into  the vein daily. Home TPN . [facility] [place] . 1 bag for 16 hours. [phone]   Yes [provider]  Probiotic Product (PROBIOTIC-10 PO) Take 1 capsule by mouth daily.    Yes [provider]  promethazine (PHENERGAN) 25 MG tablet Take 1 tablet (25 mg total) by mouth every 6 (six) hours as needed for nausea or vomiting. TAKE 1 TABLET BY MOUTH EVERY 6 HOURS AS NEEDED FOR NAUSEA AND VOMITING 07/29/20  Yes [name], [name], MD  sucralfate (CARAFATE) 1 GM/10ML suspension Take 10 mLs (1 g total) by mouth 4 (four) times daily -  with meals and at bedtime. 06/28/20  Yes [name], [name], MD  traZODone (DESYREL) 100 MG tablet Take 2 tablets (200 mg total) by mouth at bedtime as needed for sleep. 06/28/20  Yes [name], [name], MD  vitamin B-12 (CYANOCOBALAMIN) 100 MCG tablet Take 1,000 mcg by mouth daily.    Yes [provider]     Vital Signs: BP (!) 166/81 (BP Location: Left Arm)   Pulse 85   Temp 98.6 F (37 C) (Oral)   Resp 20   SpO2 100%   Physical Exam awake, alert.  Chest clear to auscultation bilaterally.  Heart with normal rate, some occasional ectopy.  Abdomen soft, positive bowel sounds, some mild diffuse tenderness to palpation.  Clean, intact left femoral venous catheter.  Site nontender.  No lower extremity edema.  Imaging: CT ABDOMEN PELVIS WO CONTRAST  Result Date: 08/02/2020 CLINICAL DATA:  Fever EXAM: CT ABDOMEN AND PELVIS WITHOUT CONTRAST TECHNIQUE: Multidetector CT imaging of the abdomen and pelvis was performed following the standard protocol without IV contrast. COMPARISON:  CT 07/19/2020, 06/27/2020, 05/05/2020, 04/13/2020 FINDINGS: Lower chest: Lung bases demonstrate no acute consolidation or effusion. Borderline to mild cardiomegaly. Hepatobiliary: No focal hepatic abnormality. Small amount of pneumobilia, chronic finding. Status post cholecystectomy. Pancreas: No inflammatory change.  Minimal ductal  prominence. Spleen: Normal in size without focal abnormality. Adrenals/Urinary Tract: Adrenal glands are unremarkable. Kidneys are normal, without renal calculi, focal lesion, or hydronephrosis. Bladder is unremarkable. Stomach/Bowel: Contrast within the stomach and small bowel. Contrast is also reached the distal colon. No obstructive changes are identified. Evidence of prior bowel resection. No acute bowel wall thickening. Vascular/Lymphatic: Left lower extremity central venous catheter with tip in the intra hepatic IVC. No suspicious adenopathy. Reproductive: Status post hysterectomy. No adnexal masses. Other: Negative for free air or free fluid. Musculoskeletal: No acute osseous abnormality. Stable focal sclerosis in the left iliac bone. IMPRESSION: No CT evidence for acute intra-abdominal or pelvic abnormality. Evidence  of prior bowel resection without evidence for obstruction or acute bowel wall thickening. Status post cholecystectomy. Trace amount of pneumobilia, chronic finding. Left lower extremity central venous catheter with tip in the intra hepatic IVC. Electronically Signed   By: Donavan Foil M.D.   On: 08/02/2020 23:00   DG Chest Port 1 View  Result Date: 08/02/2020 CLINICAL DATA:  Fever, sepsis EXAM: PORTABLE CHEST 1 VIEW COMPARISON:  07/19/2020 FINDINGS: Single frontal view of the chest demonstrates a stable cardiac silhouette. No airspace disease, effusion, or pneumothorax. No acute bony abnormalities. Surgical clips right hilum unchanged. IMPRESSION: 1. No acute intrathoracic process. Electronically Signed   By: Randa Ngo M.D.   On: 08/02/2020 20:00    Labs:  CBC: Recent Labs    07/23/20 0323 08/02/20 2020 08/03/20 0434 08/04/20 0521  WBC 4.1 18.5* 19.8* 7.2  HGB 7.5* 8.1* 7.9* 7.3*  HCT 23.2* 24.0* 24.9* 22.9*  PLT 215 146* 128* 104*    COAGS: Recent Labs    03/05/20 0048 06/27/20 2043 07/20/20 0331 08/02/20 2020  INR 1.0 1.0 1.1 1.1  APTT 24 28 40* 28     BMP: Recent Labs    08/02/20 1856 08/03/20 0434 08/04/20 0521 08/05/20 0605  NA 135 136 134* 138  K 4.3 4.6 4.4 4.2  CL 103 102 108 110  CO2 16* 20* 20* 14*  GLUCOSE 82 100* 98 98  BUN 46* 44* 28* 19  CALCIUM 9.2 9.2 8.9 9.4  CREATININE 2.56* 2.29*  2.19* 2.17* 1.69*  GFRNONAA 20* 23*  24* 24* 33*  GFRAA 23* 26*  28* 28* 38*    LIVER FUNCTION TESTS: Recent Labs    08/02/20 1856 08/03/20 0434 08/04/20 0521 08/05/20 0605  BILITOT 1.4* 2.0* 0.9 0.8  AST 78* 68* 36 33  ALT 80* 79* 55* 46*  ALKPHOS 360* 314* 259* 238*  PROT 7.7 6.7 6.6 6.6  ALBUMIN 3.2* 2.8* 2.6* 2.8*    Assessment and Plan:  Pt with history of Crohn's disease with short gut syndrome and is chronically dependent upon TPN.  She has had numerous long-term central venous access procedures dating back to 2003 and reportedly occluded in her arms and neck regions bilaterally. She currently has a chronic indwelling left femoral tunneled PICC which gets exchanged whenever she develops bacteremia. Her last exchange was performed on 07/20/2020.  She presented to [facility] ED on 9/7 with fevers, abd pain, nausea despite OP antbx therapy and blood cultures revealing enterobacterales and enterobacter cloacae complex.  Latest WBC normal.  COVID-19 negative.  Request again received from ID service for removal of existing left femoral tunneled catheter and placement of new right femoral tunneled catheter if possible.  Case reviewed by Dr. Pascal Lux.  Details/risk of procedure, including not limited to, internal bleeding, infection, injury to adjacent structures, inability to place right-sided femoral catheter discussed with patient with her understanding and consent.   Electronically Signed: D. Rowe Robert, PA-C 08/05/2020, 9:58 AM   I spent a total of 20 minutes at the the patient's bedside AND on the patient's hospital floor or unit, greater than 50% of which was counseling/coordinating care for removal of left femoral  tunneled central venous catheter and placement of new right femoral tunneled central venous catheter    Patient ID: Caitlin Peters, female   DOB: July 14, 1961, 59 y.o.   MRN: [id number]

## 2020-08-05 NOTE — Procedures (Signed)
Pre procedural Diagnosis: Poor venous access; Bacterermia Post Procedural Diagnosis: Same  Successful placement of 45 cm dual lumen tunneled central venous catheter via the right common femoral vein with tip terminating at the superior caval atrial junction.  The catheter is ready for immediate use.  Successful bedside removal of existing left common femoral approach tunneled central venous catheter.     EBL: None No immediate post procedural complication.   Ronny Bacon, MD Pager #: [phone]

## 2020-08-05 NOTE — Progress Notes (Signed)
Initial Nutrition Assessment  INTERVENTION:   -Resume home TPN when able   NUTRITION DIAGNOSIS:   Increased nutrient needs related to altered GI function, chronic illness (short gut syndrome) as evidenced by estimated needs.  GOAL:   Patient will meet greater than or equal to 90% of their needs  MONITOR:   PO intake, Labs, Weight trends, I & O's (TPN)  REASON FOR ASSESSMENT:   Consult New TPN/TNA  ASSESSMENT:   59 y.o. female with medical history significant of chronic diarrhea, hypertension, CKD stage III, chronic anemia, Crohn's disease, short gut syndrome/TPN dependent, chronic pain on methadone, malnutrition, anxiety, depression, tobacco use, recent hospital admission for Enterobacter and stenotrophomonas sepsis in the setting of long-term PICC line in the left thigh which was treated with IV antibiotics and subsequently oral Bactrim on discharge till 08/02/2020 presented on 08/02/2020 with fever.  Patient readmitted for recurrent fevers. Last discharged from hospital 8/28. Pt TPN dependent d/t short gut syndrome.  IR to remove existing femoral tunneled catheter and replace.  Pt consumed 75% of dinner last night.  Per weight records, no weight has been recorded for this admission. Last recorded weight is 144 lbs on 8/24.  Labs reviewed. Medications: Vitamin D, Lovaza, Carafate, Vitamin B-12, IV Zofran, Creon PRN  NUTRITION - FOCUSED PHYSICAL EXAM:  Unable to complete  Diet Order:   Diet Order            Diet Heart Room service appropriate? Yes; Fluid consistency: Thin  Diet effective now                 EDUCATION NEEDS:   No education needs have been identified at this time  Skin:  Skin Assessment: Reviewed RN Assessment  Last BM:  9/7  Height:   Ht Readings from Last 1 Encounters:  07/19/20 5' 8"  (1.727 m)    Weight:   Wt Readings from Last 1 Encounters:  07/19/20 65.5 kg   BMI:  There is no height or weight on file to calculate BMI.  Estimated  Nutritional Needs:   Kcal:  2000-2200  Protein:  100-115g  Fluid:  2L/day  Clayton Bibles, MS, RD, LDN Inpatient Clinical Dietitian Contact information available via Amion

## 2020-08-05 NOTE — Telephone Encounter (Signed)
Devon called to say pt needs a refill on her medication nausea promethazine and her potassium pills..sent to [facility]

## 2020-08-05 NOTE — Progress Notes (Signed)
Pharmacy Antibiotic Note  Caitlin Peters is a 59 y.o. 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 Left femoral line pulled and right placed by IR 9/10. Pharmacy has been consulted cefepime dosing.   08/05/20 2:42 PM  - SCr improved to 1.69 - CrCl ~ 36 ml/min - afebrile - WBC 7.2 (9/9)  Plan: Will increase cefepime to 2 g iv q 12 hours  F/U renal function, clinical course    Temp (24hrs), Avg:98.4 F (36.9 C), Min:98.1 F (36.7 C), Max:98.6 F (37 C)  Recent Labs  Lab 08/02/20 1856 08/02/20 2020 08/03/20 0434 08/04/20 0521 08/05/20 0605  WBC  --  18.5* 19.8* 7.2  --   CREATININE 2.56*  --  2.29*  2.19* 2.17* 1.69*  LATICACIDVEN 1.5 1.0  --   --   --     CrCl cannot be calculated (Unknown ideal weight.).    Allergies  Allergen Reactions  . Cefepime Other (See Comments)    Neurotoxicity occurring in setting of AKI. Ceftriaxone tolerated during same admit  . Gabapentin Other (See Comments)    unknown  . Hyoscyamine Hives and Swelling    Legs swelling   Disorientation  . Lyrica [Pregabalin] Other (See Comments)    unknown  . Meperidine Hives    Other reaction(s): GI Upset Due to [place]   . Topamax [Topiramate] Other (See Comments)    unknown  . Zosyn [Piperacillin Sod-Tazobactam So]     Patient reports it makes her vomit, her neck stiff, and her "heart feel funny"  . Fentanyl Rash    Pt is allergic to fentanyl patch related to the glue (gives her a rash) Pt states she is NOT allergic to fentanyl IV medicine  . Morphine And Related Rash    Antimicrobials this admission: 9/7 metronidazole x1  9/8 vancomycin >> 9/8 9/8 Zosyn >> 9/8 9/8 cefepime >>   Dose adjustments this admission: 9/10 cefepime inc  Microbiology results: 9/7 BCx: Enterobacter cloacae R cefazolin and Septra only 9/7 UCx:  ngf    Thank you for allowing pharmacy to be a part of this  patient's care.  Ulice Dash, PharmD, BCPS [phone] 08/05/2020 2:40 PM

## 2020-08-05 NOTE — Progress Notes (Signed)
Patient ID: Caitlin Peters, female   DOB: February 23, 1961, 59 y.o.   MRN: [id number]  PROGRESS NOTE    JARIYAH HACKLEY  POE:[id number] DOB: 25-Dec-1960 DOA: 08/02/2020 PCP: [name], [name], MD   Brief Narrative:  59 y.o. female with medical history significant of chronic diarrhea, hypertension, CKD stage III, chronic anemia, Crohn's disease, short gut syndrome/TPN dependent, chronic pain on methadone, malnutrition, anxiety, depression, tobacco use, recent hospital admission for Enterobacter and stenotrophomonas sepsis in the setting of long-term PICC line in the left thigh which was treated with IV antibiotics and subsequently oral Bactrim on discharge till 08/02/2020 presented on 08/02/2020 with fever.  On presentation, she was febrile with temperature of 100.8 along with tachycardia with hypotension along with leukocytosis of 18.5 creatinine of 2.5.  COVID-19 testing was negative.  Chest x-ray was negative for infiltrates.  CT of the abdomen and pelvis did not show any obvious infectious source.  She was started on broad-spectrum antibiotics and IV fluids  Assessment & Plan:   Sepsis: Present on admission Enterobacter bacteremia -Patient was recently treated for stenotrophomonas and Enterobacter bacteremia, most likely related to her chronic femoral PICC line.  She received IV antibiotics and was discharged on oral Bactrim till 08/02/2020 but subsequently presented with fever the same day. -Currently has Enterobacter bacteremia.  Broad-spectrum antibiotics have been switched to IV cefepime.  Hemodynamically improving. -ID following: Recommend IR evaluation for PICC line exchange over guidewire versus placement in a new site. IR has been consulted.  AKI on chronic kidney disease stage IIIb Anion gap metabolic acidosis -Probably from prerenal azotemia from dehydration/sepsis.  Presented with creatinine of 2.5.  Baseline creatinine 1.6-1.7.  CT without evidence of obstructive uropathy.  Creatinine  1.69 today and improving. Bicarbonate 14 today. will switch IV fluids to bicarb drip.  Anemia of chronic disease -Probably from chronic kidney disease.  Hemoglobin pending today.  No signs of bleeding.  Transfuse if hemoglobin is less than 7  Thrombocytopenia -Questionable cause.  Monitor.  No signs of bleeding  Crohn's disease/short gut syndrome -Continue budesonide.   will hold TPN as patient is currently bacteremic.  Follow ID recommendations regarding her chronic femoral PICC  Chronic pain syndrome -Continue scheduled home methadone and as needed Dilaudid for breakthrough pain  Hypertension -Antihypertensives on hold. Blood pressure on the higher side. Will resume antihypertensives  DVT prophylaxis: Subcutaneous heparin Code Status: Full Family Communication: None at bedside Disposition Plan: Status is: Inpatient  Remains inpatient appropriate because:Inpatient level of care appropriate due to severity of illness.  Currently bacteremic requiring IV antibiotics   Dispo: The patient is from: Home              Anticipated d/c is to: Home              Anticipated d/c date is: 2 days              Patient currently is not medically stable to d/c.   Consultants: ID. IR consult pending  Procedures: None  Antimicrobials:  Anti-infectives (From admission, onward)   Start     Dose/Rate Route Frequency Ordered Stop   08/04/20 2200  vancomycin (VANCOCIN) IVPB 1000 mg/200 mL premix  Status:  Discontinued        1,000 mg 200 mL/hr over 60 Minutes Intravenous Every 48 hours 08/03/20 0341 08/03/20 1514   08/03/20 1400  ceFEPIme (MAXIPIME) 2 g in sodium chloride 0.9 % 100 mL IVPB        2 g  200 mL/hr over 30 Minutes Intravenous Every 24 hours 08/03/20 1343     08/03/20 1200  cefTAZidime (FORTAZ) 2 g in sodium chloride 0.9 % 100 mL IVPB  Status:  Discontinued        2 g 200 mL/hr over 30 Minutes Intravenous Every 12 hours 08/03/20 1044 08/03/20 1330   08/03/20 1000   piperacillin-tazobactam (ZOSYN) IVPB 3.375 g  Status:  Discontinued        3.375 g 12.5 mL/hr over 240 Minutes Intravenous Every 8 hours 08/03/20 0341 08/03/20 1044   08/03/20 0400  piperacillin-tazobactam (ZOSYN) IVPB 3.375 g        3.375 g 100 mL/hr over 30 Minutes Intravenous  Once 08/03/20 0352 08/03/20 0408   08/03/20 0345  piperacillin-tazobactam (ZOSYN) IVPB 3.375 g  Status:  Discontinued        3.375 g 12.5 mL/hr over 240 Minutes Intravenous  Once 08/03/20 0341 08/03/20 0351   08/03/20 0015  vancomycin (VANCOREADY) IVPB 1250 mg/250 mL        1,250 mg 166.7 mL/hr over 90 Minutes Intravenous  Once 08/03/20 0002 08/03/20 0257   08/02/20 1930  metroNIDAZOLE (FLAGYL) IVPB 500 mg        500 mg 100 mL/hr over 60 Minutes Intravenous  Once 08/02/20 1928 08/02/20 2311   08/02/20 1930  cefTRIAXone (ROCEPHIN) 1 g in sodium chloride 0.9 % 100 mL IVPB        1 g 200 mL/hr over 30 Minutes Intravenous  Once 08/02/20 1928 08/02/20 2200       Subjective: Patient seen and examined at bedside. Denies worsening abdominal pain, fever, nausea or vomiting. Feels better but still feels weak. Tolerating diet.  Objective: Vitals:   08/04/20 1313 08/04/20 1343 08/04/20 2128 08/05/20 0532  BP: (!) 148/72 (!) 169/76 (!) 156/62 (!) 166/81  Pulse: 86 (!) 54 96 85  Resp: 18 18 20 20   Temp:  98.6 F (37 C) 98.1 F (36.7 C) 98.6 F (37 C)  TempSrc:  Oral Oral Oral  SpO2: 99% 100% 99% 100%    Intake/Output Summary (Last 24 hours) at 08/05/2020 0802 Last data filed at 08/05/2020 0420 Gross per 24 hour  Intake 2084.23 ml  Output 1550 ml  Net 534.23 ml   There were no vitals filed for this visit.  Examination:  General exam: No acute distress. Looks chronically ill. Respiratory system: Bilateral decreased breath sounds at bases with some scattered crackles Cardiovascular system: S1 & S2 heard, intermittently bradycardic Gastrointestinal system: Abdomen is nondistended, soft and mildly tender in  the lower quadrant. Bowel sounds are heard  extremities: Trace lower extremity edema. No clubbing or cyanosis Central nervous system: Awake and alert. No focal neurological deficits. Moves extremities  skin: No obvious ecchymosis/lesions. Left femoral line with no surrounding erythema seen Psychiatry: Normal mood, affect and judgment.    Data Reviewed: I have personally reviewed following labs and imaging studies  CBC: Recent Labs  Lab 08/02/20 2020 08/03/20 0434 08/04/20 0521  WBC 18.5* 19.8* 7.2  NEUTROABS 17.2*  --  5.0  HGB 8.1* 7.9* 7.3*  HCT 24.0* 24.9* 22.9*  MCV 92.3 94.0 95.0  PLT 146* 128* 025*   Basic Metabolic Panel: Recent Labs  Lab 08/02/20 1856 08/03/20 0434 08/04/20 0521 08/05/20 0605  NA 135 136 134* 138  K 4.3 4.6 4.4 4.2  CL 103 102 108 110  CO2 16* 20* 20* 14*  GLUCOSE 82 100* 98 98  BUN 46* 44* 28* 19  CREATININE 2.56* 2.29*  2.19* 2.17* 1.69*  CALCIUM 9.2 9.2 8.9 9.4  MG  --  1.8 2.0 1.7  PHOS  --  3.7 3.3  --    GFR: CrCl cannot be calculated (Unknown ideal weight.). Liver Function Tests: Recent Labs  Lab 08/02/20 1856 08/03/20 0434 08/04/20 0521 08/05/20 0605  AST 78* 68* 36 33  ALT 80* 79* 55* 46*  ALKPHOS 360* 314* 259* 238*  BILITOT 1.4* 2.0* 0.9 0.8  PROT 7.7 6.7 6.6 6.6  ALBUMIN 3.2* 2.8* 2.6* 2.8*   Recent Labs  Lab 08/02/20 2020  LIPASE 24   No results for input(s): AMMONIA in the last 168 hours. Coagulation Profile: Recent Labs  Lab 08/02/20 2020  INR 1.1   Cardiac Enzymes: No results for input(s): CKTOTAL, CKMB, CKMBINDEX, TROPONINI in the last 168 hours. BNP (last 3 results) No results for input(s): PROBNP in the last 8760 hours. HbA1C: No results for input(s): HGBA1C in the last 72 hours. CBG: No results for input(s): GLUCAP in the last 168 hours. Lipid Profile: No results for input(s): CHOL, HDL, LDLCALC, TRIG, CHOLHDL, LDLDIRECT in the last 72 hours. Thyroid Function Tests: No results for input(s):  TSH, T4TOTAL, FREET4, T3FREE, THYROIDAB in the last 72 hours. Anemia Panel: No results for input(s): VITAMINB12, FOLATE, FERRITIN, TIBC, IRON, RETICCTPCT in the last 72 hours. Sepsis Labs: Recent Labs  Lab 08/02/20 1856 08/02/20 2020 08/04/20 0521  PROCALCITON  --   --  43.60  LATICACIDVEN 1.5 1.0  --     Recent Results (from the past 240 hour(s))  Blood Culture (routine x 2)     Status: None (Preliminary result)   Collection Time: 08/02/20  7:26 PM   Specimen: BLOOD  Result Value Ref Range Status   Specimen Description   Final    BLOOD BLOOD RIGHT ARM Performed at [facility] [address], [place], [place] [address]    Special Requests   Final    BOTTLES DRAWN AEROBIC AND ANAEROBIC Blood Culture results may not be optimal due to an excessive volume of blood received in culture bottles Performed at [facility] [address], [place], [place] [address]    Culture  Setup Time   Final    GRAM NEGATIVE RODS IN BOTH AEROBIC AND ANAEROBIC BOTTLES CRITICAL VALUE NOTED.  VALUE IS CONSISTENT WITH PREVIOUSLY REPORTED AND CALLED VALUE.    Culture   Final    GRAM NEGATIVE RODS IDENTIFICATION TO FOLLOW Performed at [facility], [place] [address], [place], [place] [address]    Report Status PENDING  Incomplete  Urine culture     Status: None   Collection Time: 08/02/20  7:26 PM   Specimen: In/Out Cath Urine  Result Value Ref Range Status   Specimen Description   Final    IN/OUT CATH URINE Performed at [facility] [address] [place], [place] [address]    Special Requests   Final    NONE Performed at [facility], [place] [address], [place], [place] [address]    Culture   Final    NO GROWTH Performed at [facility], [place] [address], [place], [place] [address]    Report Status 08/04/2020 FINAL  Final  Blood Culture (routine x 2)     Status: Abnormal (Preliminary result)   Collection  Time: 08/02/20  7:31 PM   Specimen: BLOOD  Result Value Ref Range Status   Specimen Description   Final  BLOOD FEM LINE Performed at [facility], [place] [address], [place], [place] [address]    Special Requests   Final    BOTTLES DRAWN AEROBIC AND ANAEROBIC Blood Culture adequate volume Performed at [facility] [address] [place], [place] [address]    Culture  Setup Time   Final    GRAM NEGATIVE RODS IN BOTH AEROBIC AND ANAEROBIC BOTTLES Organism ID to follow CRITICAL RESULT CALLED TO, READ BACK BY AND VERIFIED WITH: J. LEGGE PHARMD, AT 1300 08/03/20 BY D. VANHOOK    Culture (A)  Final    ENTEROBACTER CLOACAE SUSCEPTIBILITIES TO FOLLOW Performed at [facility], [place] [address], [place], [place] [address]    Report Status PENDING  Incomplete  Blood Culture ID Panel (Reflexed)     Status: Abnormal   Collection Time: 08/02/20  7:31 PM  Result Value Ref Range Status   Enterococcus faecalis NOT DETECTED NOT DETECTED Final   Enterococcus Faecium NOT DETECTED NOT DETECTED Final   Listeria monocytogenes NOT DETECTED NOT DETECTED Final   Staphylococcus species NOT DETECTED NOT DETECTED Final   Staphylococcus aureus (BCID) NOT DETECTED NOT DETECTED Final   Staphylococcus epidermidis NOT DETECTED NOT DETECTED Final   Staphylococcus lugdunensis NOT DETECTED NOT DETECTED Final   Streptococcus species NOT DETECTED NOT DETECTED Final   Streptococcus agalactiae NOT DETECTED NOT DETECTED Final   Streptococcus pneumoniae NOT DETECTED NOT DETECTED Final   Streptococcus pyogenes NOT DETECTED NOT DETECTED Final   A.calcoaceticus-baumannii NOT DETECTED NOT DETECTED Final   Bacteroides fragilis NOT DETECTED NOT DETECTED Final   Enterobacterales DETECTED (A) NOT DETECTED Final    Comment: Enterobacterales represent a large order of gram negative bacteria, not a single organism. CRITICAL RESULT CALLED TO, READ BACK BY AND VERIFIED WITH: J. LEGGE PHARMD, AT 1300  08/03/20 BY D. VANHOOK    Enterobacter cloacae complex DETECTED (A) NOT DETECTED Final    Comment: CRITICAL RESULT CALLED TO, READ BACK BY AND VERIFIED WITH: J. LEGGE PHARMD, AT 1300 08/03/20 BY D. VANHOOK    Escherichia coli NOT DETECTED NOT DETECTED Final   Klebsiella aerogenes NOT DETECTED NOT DETECTED Final   Klebsiella oxytoca NOT DETECTED NOT DETECTED Final   Klebsiella pneumoniae NOT DETECTED NOT DETECTED Final   Proteus species NOT DETECTED NOT DETECTED Final   Salmonella species NOT DETECTED NOT DETECTED Final   Serratia marcescens NOT DETECTED NOT DETECTED Final   Haemophilus influenzae NOT DETECTED NOT DETECTED Final   Neisseria meningitidis NOT DETECTED NOT DETECTED Final   Pseudomonas aeruginosa NOT DETECTED NOT DETECTED Final   Stenotrophomonas maltophilia NOT DETECTED NOT DETECTED Final   Candida albicans NOT DETECTED NOT DETECTED Final   Candida auris NOT DETECTED NOT DETECTED Final   Candida glabrata NOT DETECTED NOT DETECTED Final   Candida krusei NOT DETECTED NOT DETECTED Final   Candida parapsilosis NOT DETECTED NOT DETECTED Final   Candida tropicalis NOT DETECTED NOT DETECTED Final   Cryptococcus neoformans/gattii NOT DETECTED NOT DETECTED Final   CTX-M ESBL NOT DETECTED NOT DETECTED Final   Carbapenem resistance IMP NOT DETECTED NOT DETECTED Final   Carbapenem resistance KPC NOT DETECTED NOT DETECTED Final   Carbapenem resistance NDM NOT DETECTED NOT DETECTED Final   Carbapenem resist OXA 48 LIKE NOT DETECTED NOT DETECTED Final   Carbapenem resistance VIM NOT DETECTED NOT DETECTED Final    Comment: Performed at [facility], [address], [place], [place] [address]  SARS Coronavirus 2 by RT PCR (  hospital order, performed in [facility] hospital lab) Nasopharyngeal Nasopharyngeal Swab     Status: None   Collection Time: 08/02/20 10:40 PM   Specimen: Nasopharyngeal Swab  Result Value Ref Range Status   SARS Coronavirus 2 NEGATIVE NEGATIVE Final    Comment:  (NOTE) SARS-CoV-2 target nucleic acids are NOT DETECTED.  The SARS-CoV-2 RNA is generally detectable in upper and lower respiratory specimens during the acute phase of infection. The lowest concentration of SARS-CoV-2 viral copies this assay can detect is 250 copies / mL. A negative result does not preclude SARS-CoV-2 infection and should not be used as the sole basis for treatment or other patient management decisions.  A negative result may occur with improper specimen collection / handling, submission of specimen other than nasopharyngeal swab, presence of viral mutation(s) within the areas targeted by this assay, and inadequate number of viral copies (<250 copies / mL). A negative result must be combined with clinical observations, patient history, and epidemiological information.  Fact Sheet for Patients:   StrictlyIdeas.no  Fact Sheet for Healthcare Providers: BankingDealers.co.za  This test is not yet approved or  cleared by the [place] FDA and has been authorized for detection and/or diagnosis of SARS-CoV-2 by FDA under an Emergency Use Authorization (EUA).  This EUA will remain in effect (meaning this test can be used) for the duration of the COVID-19 declaration under Section 564(b)(1) of the Act, 21 U.S.C. section 360bbb-3(b)(1), unless the authorization is terminated or revoked sooner.  Performed at [facility], [place] [address] [place], [place] [address]          Radiology Studies: No results found.      Scheduled Meds: . budesonide  3 mg Oral Daily  . buPROPion  200 mg Oral Daily  . cholecalciferol  1,000 Units Oral Daily  . cycloSPORINE  1 drop Both Eyes BID  . DULoxetine  90 mg Oral Daily  . estradiol  2 mg Oral Daily  . heparin  5,000 Units Subcutaneous Q8H  . methadone  5 mg Oral 5 X Daily  . omega-3 acid ethyl esters  1,000 mg Oral Daily  . pantoprazole  40 mg Oral Daily    . sucralfate  1 g Oral TID WC & HS  . vitamin B-12  1,000 mcg Oral Daily   Continuous Infusions: . sodium chloride 75 mL/hr at 08/05/20 0420  . ceFEPime (MAXIPIME) IV Stopped (08/04/20 1507)          Aline August, MD [facility] 08/05/2020, 8:02 AM

## 2020-08-05 NOTE — Progress Notes (Signed)
Subjective: Complaining of fevers   Antibiotics:  Anti-infectives (From admission, onward)   Start     Dose/Rate Route Frequency Ordered Stop   08/04/20 2200  vancomycin (VANCOCIN) IVPB 1000 mg/200 mL premix  Status:  Discontinued        1,000 mg 200 mL/hr over 60 Minutes Intravenous Every 48 hours 08/03/20 0341 08/03/20 1514   08/03/20 1400  ceFEPIme (MAXIPIME) 2 g in sodium chloride 0.9 % 100 mL IVPB        2 g 200 mL/hr over 30 Minutes Intravenous Every 24 hours 08/03/20 1343     08/03/20 1200  cefTAZidime (FORTAZ) 2 g in sodium chloride 0.9 % 100 mL IVPB  Status:  Discontinued        2 g 200 mL/hr over 30 Minutes Intravenous Every 12 hours 08/03/20 1044 08/03/20 1330   08/03/20 1000  piperacillin-tazobactam (ZOSYN) IVPB 3.375 g  Status:  Discontinued        3.375 g 12.5 mL/hr over 240 Minutes Intravenous Every 8 hours 08/03/20 0341 08/03/20 1044   08/03/20 0400  piperacillin-tazobactam (ZOSYN) IVPB 3.375 g        3.375 g 100 mL/hr over 30 Minutes Intravenous  Once 08/03/20 0352 08/03/20 0408   08/03/20 0345  piperacillin-tazobactam (ZOSYN) IVPB 3.375 g  Status:  Discontinued        3.375 g 12.5 mL/hr over 240 Minutes Intravenous  Once 08/03/20 0341 08/03/20 0351   08/03/20 0015  vancomycin (VANCOREADY) IVPB 1250 mg/250 mL        1,250 mg 166.7 mL/hr over 90 Minutes Intravenous  Once 08/03/20 0002 08/03/20 0257   08/02/20 1930  metroNIDAZOLE (FLAGYL) IVPB 500 mg        500 mg 100 mL/hr over 60 Minutes Intravenous  Once 08/02/20 1928 08/02/20 2311   08/02/20 1930  cefTRIAXone (ROCEPHIN) 1 g in sodium chloride 0.9 % 100 mL IVPB        1 g 200 mL/hr over 30 Minutes Intravenous  Once 08/02/20 1928 08/02/20 2200      Medications: Scheduled Meds: . budesonide  3 mg Oral Daily  . buPROPion  200 mg Oral Daily  . cholecalciferol  1,000 Units Oral Daily  . cycloSPORINE  1 drop Both Eyes BID  . DULoxetine  90 mg Oral Daily  . estradiol  2 mg Oral Daily  . heparin   5,000 Units Subcutaneous Q8H  . methadone  5 mg Oral 5 X Daily  . omega-3 acid ethyl esters  1,000 mg Oral Daily  . pantoprazole  40 mg Oral Daily  . sucralfate  1 g Oral TID WC & HS  . vitamin B-12  1,000 mcg Oral Daily   Continuous Infusions: . ceFEPime (MAXIPIME) IV Stopped (08/04/20 1507)  . sodium bicarbonate (isotonic) 150 mEq in D5W 1000 mL infusion 75 mL/hr at 08/05/20 0846   PRN Meds:.acetaminophen **OR** acetaminophen, HYDROmorphone (DILAUDID) injection, lipase/protease/amylase, ondansetron (ZOFRAN) IV, polyvinyl alcohol    Objective: Weight change:   Intake/Output Summary (Last 24 hours) at 08/05/2020 1237 Last data filed at 08/05/2020 1000 Gross per 24 hour  Intake 2184.23 ml  Output 2050 ml  Net 134.23 ml   Blood pressure (!) 188/76, pulse 69, temperature 98.6 F (37 C), temperature source Oral, resp. rate 12, SpO2 100 %. Temp:  [98.1 F (36.7 C)-98.6 F (37 C)] 98.6 F (37 C) (09/10 1234) Pulse Rate:  [54-96] 69 (09/10 1234) Resp:  [12-20] 12 (09/10 1234) BP: (131-188)/(60-81)  188/76 (09/10 1234) SpO2:  [99 %-100 %] 100 % (09/10 1234)  Physical Exam: General: Alert and awake, oriented x3, not in any acute distress. HEENT: anicteric sclera, EOMI CVS regular rate, normal  Chest: , no wheezing, no respiratory distress  Extremities: no edema or deformity noted bilaterally Skin: line still in place Neuro: nonfocal  CBC:    BMET Recent Labs    08/04/20 0521 08/05/20 0605  NA 134* 138  K 4.4 4.2  CL 108 110  CO2 20* 14*  GLUCOSE 98 98  BUN 28* 19  CREATININE 2.17* 1.69*  CALCIUM 8.9 9.4     Liver Panel  Recent Labs    08/04/20 0521 08/05/20 0605  PROT 6.6 6.6  ALBUMIN 2.6* 2.8*  AST 36 33  ALT 55* 46*  ALKPHOS 259* 238*  BILITOT 0.9 0.8       Sedimentation Rate No results for input(s): ESRSEDRATE in the last 72 hours. C-Reactive Protein Recent Labs    08/04/20 0521  CRP 12.4*    Micro Results: Recent Results (from the  past 720 hour(s))  Blood culture (routine x 2)     Status: None   Collection Time: 07/12/20 11:40 AM   Specimen: Left Antecubital; Blood  Result Value Ref Range Status   Specimen Description   Final    LEFT ANTECUBITAL Performed at [place] [facility] [address], [place], [name] [address]    Special Requests   Final    BOTTLES DRAWN AEROBIC AND ANAEROBIC Blood Culture adequate volume Performed at [facility] [address] [place], [place] [address]    Culture   Final    NO GROWTH 5 DAYS Performed at [facility], [place] [address], [place], [place] [address]    Report Status 07/17/2020 FINAL  Final  SARS Coronavirus 2 by RT PCR (hospital order, performed in [facility] hospital lab) Nasopharyngeal Nasopharyngeal Swab     Status: None   Collection Time: 07/12/20 11:40 AM   Specimen: Nasopharyngeal Swab  Result Value Ref Range Status   SARS Coronavirus 2 NEGATIVE NEGATIVE Final    Comment: (NOTE) SARS-CoV-2 target nucleic acids are NOT DETECTED.  The SARS-CoV-2 RNA is generally detectable in upper and lower respiratory specimens during the acute phase of infection. The lowest concentration of SARS-CoV-2 viral copies this assay can detect is 250 copies / mL. A negative result does not preclude SARS-CoV-2 infection and should not be used as the sole basis for treatment or other patient management decisions.  A negative result may occur with improper specimen collection / handling, submission of specimen other than nasopharyngeal swab, presence of viral mutation(s) within the areas targeted by this assay, and inadequate number of viral copies (<250 copies / mL). A negative result must be combined with clinical observations, patient history, and epidemiological information.  Fact Sheet for Patients:   StrictlyIdeas.no  Fact Sheet for Healthcare Providers: BankingDealers.co.za  This test is  not yet approved or  cleared by the [place] FDA and has been authorized for detection and/or diagnosis of SARS-CoV-2 by FDA under an Emergency Use Authorization (EUA).  This EUA will remain in effect (meaning this test can be used) for the duration of the COVID-19 declaration under Section 564(b)(1) of the Act, 21 U.S.C. section 360bbb-3(b)(1), unless the authorization is terminated or revoked sooner.  Performed at [facility], [place] [address] [place], [place] [address]   Culture, blood (routine x 2)     Status:  Abnormal   Collection Time: 07/19/20  1:21 PM   Specimen: BLOOD  Result Value Ref Range Status   Specimen Description   Final    BLOOD PICC LINE Performed at [facility], [place] [address], [place], [place] [address]    Special Requests   Final    BOTTLES DRAWN AEROBIC AND ANAEROBIC Blood Culture adequate volume Performed at [facility] [address], [place], [place] [address]    Culture  Setup Time   Final    GRAM NEGATIVE RODS IN BOTH AEROBIC AND ANAEROBIC BOTTLES Organism ID to follow CRITICAL RESULT CALLED TO, READ BACK BY AND VERIFIED WITH: Seleta Rhymes PharmD 9:10 07/20/20 ([email or web address]) Performed at [facility], [place] [address], [place], [place] [address]    Culture (A)  Final    ENTEROBACTER CLOACAE STENOTROPHOMONAS MALTOPHILIA    Report Status 07/23/2020 FINAL  Final   Organism ID, Bacteria ENTEROBACTER CLOACAE  Final   Organism ID, Bacteria STENOTROPHOMONAS MALTOPHILIA  Final      Susceptibility   Enterobacter cloacae - MIC*    CEFAZOLIN >=64 RESISTANT Resistant     CEFEPIME <=0.12 SENSITIVE Sensitive     CEFTAZIDIME <=1 SENSITIVE Sensitive     CIPROFLOXACIN <=0.25 SENSITIVE Sensitive     GENTAMICIN <=1 SENSITIVE Sensitive     IMIPENEM 0.5 SENSITIVE Sensitive     TRIMETH/SULFA <=20 SENSITIVE Sensitive     PIP/TAZO <=4 SENSITIVE Sensitive     * ENTEROBACTER CLOACAE   Stenotrophomonas  maltophilia - MIC*    LEVOFLOXACIN 0.5 SENSITIVE Sensitive     TRIMETH/SULFA <=20 SENSITIVE Sensitive     * STENOTROPHOMONAS MALTOPHILIA  Blood Culture ID Panel (Reflexed)     Status: Abnormal   Collection Time: 07/19/20  1:21 PM  Result Value Ref Range Status   Enterococcus faecalis NOT DETECTED NOT DETECTED Final   Enterococcus Faecium NOT DETECTED NOT DETECTED Final   Listeria monocytogenes NOT DETECTED NOT DETECTED Final   Staphylococcus species NOT DETECTED NOT DETECTED Final   Staphylococcus aureus (BCID) NOT DETECTED NOT DETECTED Final   Staphylococcus epidermidis NOT DETECTED NOT DETECTED Final   Staphylococcus lugdunensis NOT DETECTED NOT DETECTED Final   Streptococcus species NOT DETECTED NOT DETECTED Final   Streptococcus agalactiae NOT DETECTED NOT DETECTED Final   Streptococcus pneumoniae NOT DETECTED NOT DETECTED Final   Streptococcus pyogenes NOT DETECTED NOT DETECTED Final   A.calcoaceticus-baumannii NOT DETECTED NOT DETECTED Final   Bacteroides fragilis NOT DETECTED NOT DETECTED Final   Enterobacterales DETECTED (A) NOT DETECTED Final    Comment: Enterobacterales represent a large order of gram negative bacteria, not a single organism. CRITICAL RESULT CALLED TO, READ BACK BY AND VERIFIED WITH: Seleta Rhymes PharmD 9:10 07/20/20 ([email or web address])    Enterobacter cloacae complex DETECTED (A) NOT DETECTED Final    Comment: CRITICAL RESULT CALLED TO, READ BACK BY AND VERIFIED WITH: Seleta Rhymes PharmD 9:10 07/20/20 ([email or web address])    Escherichia coli NOT DETECTED NOT DETECTED Final   Klebsiella aerogenes NOT DETECTED NOT DETECTED Final   Klebsiella oxytoca NOT DETECTED NOT DETECTED Final   Klebsiella pneumoniae NOT DETECTED NOT DETECTED Final   Proteus species NOT DETECTED NOT DETECTED Final   Salmonella species NOT DETECTED NOT DETECTED Final   Serratia marcescens NOT DETECTED NOT DETECTED Final   Haemophilus influenzae NOT DETECTED NOT DETECTED Final   Neisseria meningitidis NOT  DETECTED NOT DETECTED Final   Pseudomonas aeruginosa NOT DETECTED NOT DETECTED Final   Stenotrophomonas maltophilia DETECTED (A) NOT DETECTED  Final    Comment: CRITICAL RESULT CALLED TO, READ BACK BY AND VERIFIED WITH: Seleta Rhymes PharmD 9:10 07/20/20 ([email or web address])    Candida albicans NOT DETECTED NOT DETECTED Final   Candida auris NOT DETECTED NOT DETECTED Final   Candida glabrata NOT DETECTED NOT DETECTED Final   Candida krusei NOT DETECTED NOT DETECTED Final   Candida parapsilosis NOT DETECTED NOT DETECTED Final   Candida tropicalis NOT DETECTED NOT DETECTED Final   Cryptococcus neoformans/gattii NOT DETECTED NOT DETECTED Final   CTX-M ESBL NOT DETECTED NOT DETECTED Final   Carbapenem resistance IMP NOT DETECTED NOT DETECTED Final   Carbapenem resistance KPC NOT DETECTED NOT DETECTED Final   Carbapenem resistance NDM NOT DETECTED NOT DETECTED Final   Carbapenem resist OXA 48 LIKE NOT DETECTED NOT DETECTED Final   Carbapenem resistance VIM NOT DETECTED NOT DETECTED Final    Comment: Performed at [facility], [place] [address] [place], [place] [address]  Culture, blood (routine x 2)     Status: Abnormal   Collection Time: 07/19/20  1:26 PM   Specimen: BLOOD RIGHT FOREARM  Result Value Ref Range Status   Specimen Description   Final    BLOOD RIGHT FOREARM Performed at [facility] [address] [place], [place] [address]    Special Requests   Final    BOTTLES DRAWN AEROBIC AND ANAEROBIC Blood Culture adequate volume Performed at [facility] [address] [place], [place] [address]    Culture  Setup Time   Final    GRAM NEGATIVE RODS IN BOTH AEROBIC AND ANAEROBIC BOTTLES CRITICAL VALUE NOTED.  VALUE IS CONSISTENT WITH PREVIOUSLY REPORTED AND CALLED VALUE.    Culture (A)  Final    ENTEROBACTER CLOACAE SUSCEPTIBILITIES PERFORMED ON PREVIOUS CULTURE WITHIN THE LAST 5 DAYS. Performed at [facility], [place] [address],  [place], [place] [address]    Report Status 07/23/2020 FINAL  Final  SARS Coronavirus 2 by RT PCR (hospital order, performed in [facility] hospital lab) Nasopharyngeal Nasopharyngeal Swab     Status: None   Collection Time: 07/19/20  1:32 PM   Specimen: Nasopharyngeal Swab  Result Value Ref Range Status   SARS Coronavirus 2 NEGATIVE NEGATIVE Final    Comment: (NOTE) SARS-CoV-2 target nucleic acids are NOT DETECTED.  The SARS-CoV-2 RNA is generally detectable in upper and lower respiratory specimens during the acute phase of infection. The lowest concentration of SARS-CoV-2 viral copies this assay can detect is 250 copies / mL. A negative result does not preclude SARS-CoV-2 infection and should not be used as the sole basis for treatment or other patient management decisions.  A negative result may occur with improper specimen collection / handling, submission of specimen other than nasopharyngeal swab, presence of viral mutation(s) within the areas targeted by this assay, and inadequate number of viral copies (<250 copies / mL). A negative result must be combined with clinical observations, patient history, and epidemiological information.  Fact Sheet for Patients:   StrictlyIdeas.no  Fact Sheet for Healthcare Providers: BankingDealers.co.za  This test is not yet approved or  cleared by the [place] FDA and has been authorized for detection and/or diagnosis of SARS-CoV-2 by FDA under an Emergency Use Authorization (EUA).  This EUA will remain in effect (meaning this test can be used) for the duration of the COVID-19 declaration under Section 564(b)(1) of the Act, 21 U.S.C. section 360bbb-3(b)(1), unless the authorization is terminated or revoked sooner.  Performed at [facility]  [place] [address] [place], [place] [address]   Urine culture     Status: Abnormal   Collection Time: 07/20/20  3:00 PM   Specimen:  Urine, Clean Catch  Result Value Ref Range Status   Specimen Description   Final    URINE, CLEAN CATCH Performed at [facility], [place] [address], [place], [place] [address]    Special Requests   Final    NONE Performed at [facility], [place] [address] [place], [place] [address]    Culture (A)  Final    <10,000 COLONIES/mL INSIGNIFICANT GROWTH Performed at [place] [address], [place], [place] [address]    Report Status 07/21/2020 FINAL  Final  Blood Culture (routine x 2)     Status: Abnormal   Collection Time: 08/02/20  7:26 PM   Specimen: BLOOD  Result Value Ref Range Status   Specimen Description   Final    BLOOD BLOOD RIGHT ARM Performed at [facility] [address], [place], [place] [address]    Special Requests   Final    BOTTLES DRAWN AEROBIC AND ANAEROBIC Blood Culture results may not be optimal due to an excessive volume of blood received in culture bottles Performed at [facility] [address] [place], [place] [address]    Culture  Setup Time   Final    GRAM NEGATIVE RODS IN BOTH AEROBIC AND ANAEROBIC BOTTLES CRITICAL VALUE NOTED.  VALUE IS CONSISTENT WITH PREVIOUSLY REPORTED AND CALLED VALUE.    Culture (A)  Final    ENTEROBACTER CLOACAE SUSCEPTIBILITIES PERFORMED ON PREVIOUS CULTURE WITHIN THE LAST 5 DAYS. Performed at [facility], [place] [address] [place], [place] [address]    Report Status 08/05/2020 FINAL  Final  Urine culture     Status: None   Collection Time: 08/02/20  7:26 PM   Specimen: In/Out Cath Urine  Result Value Ref Range Status   Specimen Description   Final    IN/OUT CATH URINE Performed at [place] [address], [place], [place] [address]    Special Requests   Final    NONE Performed at [facility], [place] [address] [place], [place] [address]    Culture   Final    NO GROWTH Performed at  [facility], [place] [address] [place], [place] [address]    Report Status 08/04/2020 FINAL  Final  Blood Culture (routine x 2)     Status: Abnormal   Collection Time: 08/02/20  7:31 PM   Specimen: BLOOD  Result Value Ref Range Status   Specimen Description   Final    BLOOD FEM LINE Performed at [facility] [address], [place], [place] [address]    Special Requests   Final    BOTTLES DRAWN AEROBIC AND ANAEROBIC Blood Culture adequate volume Performed at [facility] [address], [place], [place] [address]    Culture  Setup Time   Final    GRAM NEGATIVE RODS IN BOTH AEROBIC AND ANAEROBIC BOTTLES Organism ID to follow CRITICAL RESULT CALLED TO, READ BACK BY AND VERIFIED WITH: J. LEGGE PHARMD, AT 1300 08/03/20 BY D. VANHOOK Performed at [facility], [place] [address], [place], [place] [address]    Culture ENTEROBACTER CLOACAE (A)  Final   Report Status 08/05/2020 FINAL  Final   Organism ID, Bacteria ENTEROBACTER CLOACAE  Final  Susceptibility   Enterobacter cloacae - MIC*    CEFAZOLIN >=64 RESISTANT Resistant     CEFEPIME <=0.12 SENSITIVE Sensitive     CEFTAZIDIME <=1 SENSITIVE Sensitive     CIPROFLOXACIN 0.5 SENSITIVE Sensitive     GENTAMICIN <=1 SENSITIVE Sensitive     IMIPENEM <=0.25 SENSITIVE Sensitive     TRIMETH/SULFA >=320 RESISTANT Resistant     PIP/TAZO <=4 SENSITIVE Sensitive     * ENTEROBACTER CLOACAE  Blood Culture ID Panel (Reflexed)     Status: Abnormal   Collection Time: 08/02/20  7:31 PM  Result Value Ref Range Status   Enterococcus faecalis NOT DETECTED NOT DETECTED Final   Enterococcus Faecium NOT DETECTED NOT DETECTED Final   Listeria monocytogenes NOT DETECTED NOT DETECTED Final   Staphylococcus species NOT DETECTED NOT DETECTED Final   Staphylococcus aureus (BCID) NOT DETECTED NOT DETECTED Final   Staphylococcus epidermidis NOT DETECTED NOT DETECTED Final   Staphylococcus lugdunensis NOT DETECTED NOT DETECTED  Final   Streptococcus species NOT DETECTED NOT DETECTED Final   Streptococcus agalactiae NOT DETECTED NOT DETECTED Final   Streptococcus pneumoniae NOT DETECTED NOT DETECTED Final   Streptococcus pyogenes NOT DETECTED NOT DETECTED Final   A.calcoaceticus-baumannii NOT DETECTED NOT DETECTED Final   Bacteroides fragilis NOT DETECTED NOT DETECTED Final   Enterobacterales DETECTED (A) NOT DETECTED Final    Comment: Enterobacterales represent a large order of gram negative bacteria, not a single organism. CRITICAL RESULT CALLED TO, READ BACK BY AND VERIFIED WITH: J. LEGGE PHARMD, AT 1300 08/03/20 BY D. VANHOOK    Enterobacter cloacae complex DETECTED (A) NOT DETECTED Final    Comment: CRITICAL RESULT CALLED TO, READ BACK BY AND VERIFIED WITH: J. LEGGE PHARMD, AT 1300 08/03/20 BY D. VANHOOK    Escherichia coli NOT DETECTED NOT DETECTED Final   Klebsiella aerogenes NOT DETECTED NOT DETECTED Final   Klebsiella oxytoca NOT DETECTED NOT DETECTED Final   Klebsiella pneumoniae NOT DETECTED NOT DETECTED Final   Proteus species NOT DETECTED NOT DETECTED Final   Salmonella species NOT DETECTED NOT DETECTED Final   Serratia marcescens NOT DETECTED NOT DETECTED Final   Haemophilus influenzae NOT DETECTED NOT DETECTED Final   Neisseria meningitidis NOT DETECTED NOT DETECTED Final   Pseudomonas aeruginosa NOT DETECTED NOT DETECTED Final   Stenotrophomonas maltophilia NOT DETECTED NOT DETECTED Final   Candida albicans NOT DETECTED NOT DETECTED Final   Candida auris NOT DETECTED NOT DETECTED Final   Candida glabrata NOT DETECTED NOT DETECTED Final   Candida krusei NOT DETECTED NOT DETECTED Final   Candida parapsilosis NOT DETECTED NOT DETECTED Final   Candida tropicalis NOT DETECTED NOT DETECTED Final   Cryptococcus neoformans/gattii NOT DETECTED NOT DETECTED Final   CTX-M ESBL NOT DETECTED NOT DETECTED Final   Carbapenem resistance IMP NOT DETECTED NOT DETECTED Final   Carbapenem resistance KPC NOT  DETECTED NOT DETECTED Final   Carbapenem resistance NDM NOT DETECTED NOT DETECTED Final   Carbapenem resist OXA 48 LIKE NOT DETECTED NOT DETECTED Final   Carbapenem resistance VIM NOT DETECTED NOT DETECTED Final    Comment: Performed at [facility], [address] [place], [place] [address]  SARS Coronavirus 2 by RT PCR (hospital order, performed in [facility] hospital lab) Nasopharyngeal Nasopharyngeal Swab     Status: None   Collection Time: 08/02/20 10:40 PM   Specimen: Nasopharyngeal Swab  Result Value Ref Range Status   SARS Coronavirus 2 NEGATIVE NEGATIVE Final    Comment: (NOTE) SARS-CoV-2 target nucleic acids  are NOT DETECTED.  The SARS-CoV-2 RNA is generally detectable in upper and lower respiratory specimens during the acute phase of infection. The lowest concentration of SARS-CoV-2 viral copies this assay can detect is 250 copies / mL. A negative result does not preclude SARS-CoV-2 infection and should not be used as the sole basis for treatment or other patient management decisions.  A negative result may occur with improper specimen collection / handling, submission of specimen other than nasopharyngeal swab, presence of viral mutation(s) within the areas targeted by this assay, and inadequate number of viral copies (<250 copies / mL). A negative result must be combined with clinical observations, patient history, and epidemiological information.  Fact Sheet for Patients:   StrictlyIdeas.no  Fact Sheet for Healthcare Providers: BankingDealers.co.za  This test is not yet approved or  cleared by the [place] FDA and has been authorized for detection and/or diagnosis of SARS-CoV-2 by FDA under an Emergency Use Authorization (EUA).  This EUA will remain in effect (meaning this test can be used) for the duration of the COVID-19 declaration under Section 564(b)(1) of the Act, 21 U.S.C. section 360bbb-3(b)(1),  unless the authorization is terminated or revoked sooner.  Performed at [facility], [place] [address], [place], [place] [address]     Studies/Results: No results found.    Assessment/Plan:  INTERVAL HISTORY: IR evaluating patient for placement of new central line   Principal Problem:   Sepsis ([place]) Active Problems:   Anemia   AKI (acute kidney injury) ([place])   Crohn disease ([place])   Chronic pain syndrome   Bacteremia    Caitlin Peters is a 59 y.o. 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  Dr Johnnye Sima will follow-up her culture data and is available for questions. He will see the patient on Monday.   LOS: 3 days   Alcide Evener 08/05/2020, 12:37 PM

## 2020-08-05 NOTE — Plan of Care (Signed)

## 2020-08-05 NOTE — Care Management Important Message (Signed)
Important Message  Patient Details IM Letter given to the Patient Name: Caitlin Peters MRN: [id number] Date of Birth: 28-Feb-1961   Medicare Important Message Given:  Yes     [name] 08/05/2020, 10:49 AM

## 2020-08-05 NOTE — TOC Initial Note (Signed)
Transition of Care [facility]) - Initial/Assessment Note    Patient Details  Name: Caitlin Peters MRN: [id number] Date of Birth: 1961-04-30  Transition of Care [facility]) CM/SW Contact:    Lynnell Catalan, RN Phone Number: 08/05/2020, 12:36 PM  Clinical Narrative:                 Pt from home with TPN. She is active with [facility] for home infusion services and [facility] for [facility] services. Pam from [employer] following for [employer].  Expected Discharge Plan: [place] Barriers to Discharge: Continued Medical Work up     Expected Discharge Plan and Services Expected Discharge Plan: [place]         Expected Discharge Date:  (unknown)                   Prior Living Arrangements/Services   Lives with:: Relatives              Current home services: Home RN    Activities of Daily Living Home Assistive Devices/Equipment: Other (Comment), Walker (specify type) (front wheeled walker, 4 wheeled walker) ADL Screening (condition at time of admission) Patient's cognitive ability adequate to safely complete daily activities?: Yes Is the patient deaf or have difficulty hearing?: No Does the patient have difficulty seeing, even when wearing glasses/contacts?: No Does the patient have difficulty concentrating, remembering, or making decisions?: No Patient able to express need for assistance with ADLs?: Yes Does the patient have difficulty dressing or bathing?: No (family helps patient when using the shower or getting her up from the commode if she has a "bad" day) Independently performs ADLs?: Yes (appropriate for developmental age) Does the patient have difficulty walking or climbing stairs?: Yes (secondary to chronic pain) Weakness of Legs: Both Weakness of Arms/Hands: None  Permission Sought/Granted                  Emotional Assessment              Admission diagnosis:  Bacteremia [R78.81] Febrile illness [R50.9] Sepsis ([place])  [A41.9] Patient Active Problem List   Diagnosis Date Noted  . Bacteremia 08/04/2020  . Sepsis ([place]) 08/02/2020  . Sepsis due to Enterobacter ([place]) 07/22/2020  . SIRS (systemic inflammatory response syndrome) ([place]) 07/19/2020  . Anxiety 06/03/2020  . Acute pancreatitis 04/13/2020  . Infection due to Stenotrophomonas maltophilia 03/10/2020  . Abnormal LFTs 03/05/2020  . Pancytopenia ([place]) 03/05/2020  . Stage 3b chronic kidney disease 03/05/2020  . Infection of peripherally inserted central venous catheter (PICC)   . On total parenteral nutrition (TPN) 01/02/2020  . Elevated liver enzymes 01/02/2020  . Cholangitis   . Anasarca 10/28/2019  . Acute kidney injury superimposed on chronic kidney disease ([place]) 10/28/2019  . Falls 10/28/2019  . Crohn disease ([place])   . Malnutrition ([place])   . Short gut syndrome   . HTN (hypertension)   . Vitamin B12 deficiency   . Osteoporosis   . Depression   . GERD (gastroesophageal reflux disease)   . Chronic pain syndrome   . Hypokalemia due to excessive gastrointestinal loss of potassium 10/13/2019  . Diarrhea   . Fever   . Hypokalemia 10/12/2019  . Chronic diarrhea 10/12/2019  . Dysuria 10/12/2019  . Bilateral lower extremity edema 10/12/2019  . Crohn's colitis ([place]) 10/12/2019  . Anemia 10/12/2019  . Hypertension 10/12/2019  . AKI (acute kidney injury) ([place]) 10/12/2019   PCP:  [name], [name], MD  Pharmacy:   [facility] - [place], [place] - [address] [place] [address] Phone: [phone] Fax: [phone]  [place], [place] [place] [address] Phone: [phone] Fax: [phone]     Social Determinants of Health (SDOH) Interventions    Readmission Risk Interventions Readmission Risk Prevention Plan 08/05/2020 06/29/2020 11/30/2019  Transportation Screening Complete Complete Complete   PCP or Specialist Appt within 3-5 Days - - -  Not Complete comments - - -  HRI or [place] for [place] consult not completed comments - - -  Palliative Care Screening - - -  Medication Review Press photographer) Complete Complete Complete  PCP or Specialist appointment within 3-5 days of discharge Complete Complete Complete  HRI or Home Care Consult Complete Complete Complete  SW Recovery Care/Counseling Consult Complete Complete Complete  Palliative Care Screening Not Applicable Not Applicable Not [place] Not Applicable Not Applicable Not Applicable

## 2020-08-06 LAB — CBC WITH DIFFERENTIAL/PLATELET
Abs Immature Granulocytes: 0.01 10*3/uL (ref 0.00–0.07)
Basophils Absolute: 0 10*3/uL (ref 0.0–0.1)
Basophils Relative: 1 %
Eosinophils Absolute: 0 10*3/uL (ref 0.0–0.5)
Eosinophils Relative: 1 %
HCT: 24 % — ABNORMAL LOW (ref 36.0–46.0)
Hemoglobin: 7.8 g/dL — ABNORMAL LOW (ref 12.0–15.0)
Immature Granulocytes: 0 %
Lymphocytes Relative: 43 %
Lymphs Abs: 1.5 10*3/uL (ref 0.7–4.0)
MCH: 30 pg (ref 26.0–34.0)
MCHC: 32.5 g/dL (ref 30.0–36.0)
MCV: 92.3 fL (ref 80.0–100.0)
Monocytes Absolute: 0.3 10*3/uL (ref 0.1–1.0)
Monocytes Relative: 8 %
Neutro Abs: 1.6 10*3/uL — ABNORMAL LOW (ref 1.7–7.7)
Neutrophils Relative %: 47 %
Platelets: 152 10*3/uL (ref 150–400)
RBC: 2.6 MIL/uL — ABNORMAL LOW (ref 3.87–5.11)
RDW: 14.4 % (ref 11.5–15.5)
WBC: 3.5 10*3/uL — ABNORMAL LOW (ref 4.0–10.5)
nRBC: 0 % (ref 0.0–0.2)

## 2020-08-06 LAB — MAGNESIUM: Magnesium: 1.6 mg/dL — ABNORMAL LOW (ref 1.7–2.4)

## 2020-08-06 LAB — BASIC METABOLIC PANEL
Anion gap: 10 (ref 5–15)
BUN: 14 mg/dL (ref 6–20)
CO2: 28 mmol/L (ref 22–32)
Calcium: 9.3 mg/dL (ref 8.9–10.3)
Chloride: 101 mmol/L (ref 98–111)
Creatinine, Ser: 1.23 mg/dL — ABNORMAL HIGH (ref 0.44–1.00)
GFR calc Af Amer: 56 mL/min — ABNORMAL LOW (ref >60)
GFR calc non Af Amer: 48 mL/min — ABNORMAL LOW (ref >60)
Glucose, Bld: 105 mg/dL — ABNORMAL HIGH (ref 70–99)
Potassium: 3.4 mmol/L — ABNORMAL LOW (ref 3.5–5.1)
Sodium: 139 mmol/L (ref 135–145)

## 2020-08-06 MED ORDER — POTASSIUM CHLORIDE CRYS ER 20 MEQ PO TBCR
40.0000 meq | EXTENDED_RELEASE_TABLET | ORAL | Status: AC
  Administered 2020-08-06 (×2): 40 meq via ORAL
  Filled 2020-08-06 (×2): qty 2

## 2020-08-06 MED ORDER — MAGNESIUM SULFATE 2 GM/50ML IV SOLN
2.0000 g | Freq: Once | INTRAVENOUS | Status: AC
  Administered 2020-08-06: 2 g via INTRAVENOUS
  Filled 2020-08-06: qty 50

## 2020-08-06 NOTE — Progress Notes (Signed)
Patient ID: Caitlin Peters, female   DOB: 1960-12-08, 59 y.o.   MRN: [id number]  PROGRESS NOTE    MARCUS GROLL  JIR:[id number] DOB: November 25, 1961 DOA: 08/02/2020 PCP: [name], [name], MD   Brief Narrative:  59 y.o. female with medical history significant of chronic diarrhea, hypertension, CKD stage III, chronic anemia, Crohn's disease, short gut syndrome/TPN dependent, chronic pain on methadone, malnutrition, anxiety, depression, tobacco use, recent hospital admission for Enterobacter and stenotrophomonas sepsis in the setting of long-term PICC line in the left thigh which was treated with IV antibiotics and subsequently oral Bactrim on discharge till 08/02/2020 presented on 08/02/2020 with fever.  On presentation, she was febrile with temperature of 100.8 along with tachycardia with hypotension along with leukocytosis of 18.5 creatinine of 2.5.  COVID-19 testing was negative.  Chest x-ray was negative for infiltrates.  CT of the abdomen and pelvis did not show any obvious infectious source.  She was started on broad-spectrum antibiotics and IV fluids.  She was found to have Enterobacter bacteremia.  ID was consulted.  Subsequently IR was also consulted.  Assessment & Plan:   Sepsis: Present on admission Enterobacter bacteremia -Patient was recently treated for stenotrophomonas and Enterobacter bacteremia, most likely related to her chronic femoral PICC line.  She received IV antibiotics and was discharged on oral Bactrim till 08/02/2020 but subsequently presented with fever the same day. -Currently has Enterobacter bacteremia.  Broad-spectrum antibiotics have been switched to IV cefepime.  Hemodynamically improving. -ID following and recommended IR evaluation.  Status post removal of existing left common femoral tunneled central venous catheter and placement of right common femoral vein tunneled central Venous catheter on 08/05/2020 by IR. -Repeat blood cultures in a.m.   AKI on chronic kidney  disease stage IIIb Anion gap metabolic acidosis -Probably from prerenal azotemia from dehydration/sepsis.  Presented with creatinine of 2.5.  Baseline creatinine 1.6-1.7.  CT without evidence of obstructive uropathy.  Creatinine 1.23 today and improving. Bicarbonate 28 today.  DC bicarb drip  Hypokalemia -Replace.  Repeat a.m. labs  Hypomagnesemia -Replace.  Repeat a.m. labs  Anemia of chronic disease -Probably from chronic kidney disease.  Hemoglobin pending today.  No signs of bleeding.  Transfuse if hemoglobin is less than 7  Thrombocytopenia -Questionable cause.  Resolved  Crohn's disease/short gut syndrome -Continue budesonide.   TPN on hold for now.  If repeat blood cultures are negative, will resume TPN.  Chronic pain syndrome -Continue scheduled home methadone and as needed Dilaudid for breakthrough pain  Hypertension -Continue amlodipine and metoprolol.  Blood pressure on the higher side.  Monitor  DVT prophylaxis: Subcutaneous heparin Code Status: Full Family Communication: None at bedside Disposition Plan: Status is: Inpatient  Remains inpatient appropriate because:Inpatient level of care appropriate due to severity of illness.  Still requiring IV antibiotics.  Will need repeat blood cultures.   Dispo: The patient is from: Home              Anticipated d/c is to: Home              Anticipated d/c date is: 2 days              Patient currently is not medically stable to d/c.   Consultants: ID. IR  Procedures:  removal of existing left common femoral tunneled central venous catheter and placement of right common femoral vein tunneled central Venous catheter on 08/05/2020 by IR.   Antimicrobials:  Anti-infectives (From admission, onward)   Start  Dose/Rate Route Frequency Ordered Stop   08/05/20 1500  ceFEPIme (MAXIPIME) 2 g in sodium chloride 0.9 % 100 mL IVPB        2 g 200 mL/hr over 30 Minutes Intravenous Every 12 hours 08/05/20 1346     08/04/20 2200   vancomycin (VANCOCIN) IVPB 1000 mg/200 mL premix  Status:  Discontinued        1,000 mg 200 mL/hr over 60 Minutes Intravenous Every 48 hours 08/03/20 0341 08/03/20 1514   08/03/20 1400  ceFEPIme (MAXIPIME) 2 g in sodium chloride 0.9 % 100 mL IVPB  Status:  Discontinued        2 g 200 mL/hr over 30 Minutes Intravenous Every 24 hours 08/03/20 1343 08/05/20 1346   08/03/20 1200  cefTAZidime (FORTAZ) 2 g in sodium chloride 0.9 % 100 mL IVPB  Status:  Discontinued        2 g 200 mL/hr over 30 Minutes Intravenous Every 12 hours 08/03/20 1044 08/03/20 1330   08/03/20 1000  piperacillin-tazobactam (ZOSYN) IVPB 3.375 g  Status:  Discontinued        3.375 g 12.5 mL/hr over 240 Minutes Intravenous Every 8 hours 08/03/20 0341 08/03/20 1044   08/03/20 0400  piperacillin-tazobactam (ZOSYN) IVPB 3.375 g        3.375 g 100 mL/hr over 30 Minutes Intravenous  Once 08/03/20 0352 08/03/20 0408   08/03/20 0345  piperacillin-tazobactam (ZOSYN) IVPB 3.375 g  Status:  Discontinued        3.375 g 12.5 mL/hr over 240 Minutes Intravenous  Once 08/03/20 0341 08/03/20 0351   08/03/20 0015  vancomycin (VANCOREADY) IVPB 1250 mg/250 mL        1,250 mg 166.7 mL/hr over 90 Minutes Intravenous  Once 08/03/20 0002 08/03/20 0257   08/02/20 1930  metroNIDAZOLE (FLAGYL) IVPB 500 mg        500 mg 100 mL/hr over 60 Minutes Intravenous  Once 08/02/20 1928 08/02/20 2311   08/02/20 1930  cefTRIAXone (ROCEPHIN) 1 g in sodium chloride 0.9 % 100 mL IVPB        1 g 200 mL/hr over 30 Minutes Intravenous  Once 08/02/20 1928 08/02/20 2200       Subjective: Patient seen and examined at bedside.  Feels that her pain is controlled.  Denies worsening abdominal pain, diarrhea, vomiting or fever. Objective: Vitals:   08/05/20 1410 08/05/20 1655 08/05/20 2029 08/06/20 0517  BP: (!) 166/94 (!) 151/67 (!) 173/86 (!) 185/88  Pulse: (!) 107 70 (!) 54 (!) 51  Resp: 12  16 14   Temp:   98.6 F (37 C) 98.7 F (37.1 C)  TempSrc:   Oral  Oral  SpO2: 100%  100% 97%    Intake/Output Summary (Last 24 hours) at 08/06/2020 2119 Last data filed at 08/05/2020 1800 Gross per 24 hour  Intake 656.74 ml  Output 1100 ml  Net -443.26 ml   There were no vitals filed for this visit.  Examination:  General exam: No distress.  Looks chronically ill. Respiratory system: Bilateral decreased breath sounds at bases with no wheezing cardiovascular system: Intermittent bradycardia; S1-S2 heard Gastrointestinal system: Abdomen is nondistended, soft and mild tenderness in the lower quadrant.  Normal bowel sounds heard extremities: No lower extremity clubbing or edema  Central nervous system: Awake and oriented.  No focal neurological deficits.  Moving extremities.   Skin: No obvious rashes/ulcers.  Right femoral PICC line area looks okay without erythema or discharge psychiatry: Normal judgment, mood and affect.  Data Reviewed: I have personally reviewed following labs and imaging studies  CBC: Recent Labs  Lab 08/02/20 2020 08/03/20 0434 08/04/20 0521 08/05/20 1605 08/06/20 0551  WBC 18.5* 19.8* 7.2 3.4* 3.5*  NEUTROABS 17.2*  --  5.0 1.7 1.6*  HGB 8.1* 7.9* 7.3* 7.7* 7.8*  HCT 24.0* 24.9* 22.9* 23.7* 24.0*  MCV 92.3 94.0 95.0 93.7 92.3  PLT 146* 128* 104* 147* 268   Basic Metabolic Panel: Recent Labs  Lab 08/02/20 1856 08/03/20 0434 08/04/20 0521 08/05/20 0605 08/06/20 0551  NA 135 136 134* 138 139  K 4.3 4.6 4.4 4.2 3.4*  CL 103 102 108 110 101  CO2 16* 20* 20* 14* 28  GLUCOSE 82 100* 98 98 105*  BUN 46* 44* 28* 19 14  CREATININE 2.56* 2.29*   2.19* 2.17* 1.69* 1.23*  CALCIUM 9.2 9.2 8.9 9.4 9.3  MG  --  1.8 2.0 1.7 1.6*  PHOS  --  3.7 3.3  --   --    GFR: CrCl cannot be calculated (Unknown ideal weight.). Liver Function Tests: Recent Labs  Lab 08/02/20 1856 08/03/20 0434 08/04/20 0521 08/05/20 0605  AST 78* 68* 36 33  ALT 80* 79* 55* 46*  ALKPHOS 360* 314* 259* 238*  BILITOT 1.4* 2.0* 0.9 0.8    PROT 7.7 6.7 6.6 6.6  ALBUMIN 3.2* 2.8* 2.6* 2.8*   Recent Labs  Lab 08/02/20 2020  LIPASE 24   No results for input(s): AMMONIA in the last 168 hours. Coagulation Profile: Recent Labs  Lab 08/02/20 2020  INR 1.1   Cardiac Enzymes: No results for input(s): CKTOTAL, CKMB, CKMBINDEX, TROPONINI in the last 168 hours. BNP (last 3 results) No results for input(s): PROBNP in the last 8760 hours. HbA1C: No results for input(s): HGBA1C in the last 72 hours. CBG: No results for input(s): GLUCAP in the last 168 hours. Lipid Profile: No results for input(s): CHOL, HDL, LDLCALC, TRIG, CHOLHDL, LDLDIRECT in the last 72 hours. Thyroid Function Tests: No results for input(s): TSH, T4TOTAL, FREET4, T3FREE, THYROIDAB in the last 72 hours. Anemia Panel: No results for input(s): VITAMINB12, FOLATE, FERRITIN, TIBC, IRON, RETICCTPCT in the last 72 hours. Sepsis Labs: Recent Labs  Lab 08/02/20 1856 08/02/20 2020 08/04/20 0521  PROCALCITON  --   --  43.60  LATICACIDVEN 1.5 1.0  --     Recent Results (from the past 240 hour(s))  Blood Culture (routine x 2)     Status: Abnormal   Collection Time: 08/02/20  7:26 PM   Specimen: BLOOD  Result Value Ref Range Status   Specimen Description   Final    BLOOD BLOOD RIGHT ARM Performed at [facility] [address], [place], [place] [address]    Special Requests   Final    BOTTLES DRAWN AEROBIC AND ANAEROBIC Blood Culture results may not be optimal due to an excessive volume of blood received in culture bottles Performed at [facility] [address], [place], [place] [address]    Culture  Setup Time   Final    GRAM NEGATIVE RODS IN BOTH AEROBIC AND ANAEROBIC BOTTLES CRITICAL VALUE NOTED.  VALUE IS CONSISTENT WITH PREVIOUSLY REPORTED AND CALLED VALUE.    Culture (A)  Final    ENTEROBACTER CLOACAE SUSCEPTIBILITIES PERFORMED ON PREVIOUS CULTURE WITHIN THE LAST 5 DAYS. Performed at [facility], [place] [address], [place], [place] [address]    Report Status 08/05/2020 FINAL  Final  Urine culture  Status: None   Collection Time: 08/02/20  7:26 PM   Specimen: In/Out Cath Urine  Result Value Ref Range Status   Specimen Description   Final    IN/OUT CATH URINE Performed at [facility], [place] [address] [place], [place] [address]    Special Requests   Final    NONE Performed at [facility], [place] [address], [place], [place] [address]    Culture   Final    NO GROWTH Performed at [facility], [place] [address], [place], [place] [address]    Report Status 08/04/2020 FINAL  Final  Blood Culture (routine x 2)     Status: Abnormal   Collection Time: 08/02/20  7:31 PM   Specimen: BLOOD  Result Value Ref Range Status   Specimen Description   Final    BLOOD FEM LINE Performed at [facility] [address] [place], [place] [address]    Special Requests   Final    BOTTLES DRAWN AEROBIC AND ANAEROBIC Blood Culture adequate volume Performed at [facility] [address] [place], [place] [address]    Culture  Setup Time   Final    GRAM NEGATIVE RODS IN BOTH AEROBIC AND ANAEROBIC BOTTLES Organism ID to follow CRITICAL RESULT CALLED TO, READ BACK BY AND VERIFIED WITH: J. LEGGE PHARMD, AT 1300 08/03/20 BY D. VANHOOK Performed at [facility], [place] [address] [place],  [address]    Culture ENTEROBACTER CLOACAE (A)  Final   Report Status 08/05/2020 FINAL  Final   Organism ID, Bacteria ENTEROBACTER CLOACAE  Final      Susceptibility   Enterobacter cloacae - MIC*    CEFAZOLIN >=64 RESISTANT Resistant     CEFEPIME <=0.12 SENSITIVE Sensitive     CEFTAZIDIME <=1 SENSITIVE Sensitive     CIPROFLOXACIN 0.5 SENSITIVE Sensitive     GENTAMICIN <=1 SENSITIVE Sensitive     IMIPENEM <=0.25 SENSITIVE Sensitive     TRIMETH/SULFA >=320 RESISTANT Resistant     PIP/TAZO <=4 SENSITIVE Sensitive     *  ENTEROBACTER CLOACAE  Blood Culture ID Panel (Reflexed)     Status: Abnormal   Collection Time: 08/02/20  7:31 PM  Result Value Ref Range Status   Enterococcus faecalis NOT DETECTED NOT DETECTED Final   Enterococcus Faecium NOT DETECTED NOT DETECTED Final   Listeria monocytogenes NOT DETECTED NOT DETECTED Final   Staphylococcus species NOT DETECTED NOT DETECTED Final   Staphylococcus aureus (BCID) NOT DETECTED NOT DETECTED Final   Staphylococcus epidermidis NOT DETECTED NOT DETECTED Final   Staphylococcus lugdunensis NOT DETECTED NOT DETECTED Final   Streptococcus species NOT DETECTED NOT DETECTED Final   Streptococcus agalactiae NOT DETECTED NOT DETECTED Final   Streptococcus pneumoniae NOT DETECTED NOT DETECTED Final   Streptococcus pyogenes NOT DETECTED NOT DETECTED Final   A.calcoaceticus-baumannii NOT DETECTED NOT DETECTED Final   Bacteroides fragilis NOT DETECTED NOT DETECTED Final   Enterobacterales DETECTED (A) NOT DETECTED Final    Comment: Enterobacterales represent a large order of gram negative bacteria, not a single organism. CRITICAL RESULT CALLED TO, READ BACK BY AND VERIFIED WITH: J. LEGGE PHARMD, AT 1300 08/03/20 BY D. VANHOOK    Enterobacter cloacae complex DETECTED (A) NOT DETECTED Final    Comment: CRITICAL RESULT CALLED TO, READ BACK BY AND VERIFIED WITH: J. LEGGE PHARMD, AT 1300 08/03/20 BY D. VANHOOK    Escherichia coli NOT DETECTED NOT DETECTED Final   Klebsiella aerogenes NOT DETECTED  NOT DETECTED Final   Klebsiella oxytoca NOT DETECTED NOT DETECTED Final   Klebsiella pneumoniae NOT DETECTED NOT DETECTED Final   Proteus species NOT DETECTED NOT DETECTED Final   Salmonella species NOT DETECTED NOT DETECTED Final   Serratia marcescens NOT DETECTED NOT DETECTED Final   Haemophilus influenzae NOT DETECTED NOT DETECTED Final   Neisseria meningitidis NOT DETECTED NOT DETECTED Final   Pseudomonas aeruginosa NOT DETECTED NOT DETECTED Final   Stenotrophomonas  maltophilia NOT DETECTED NOT DETECTED Final   Candida albicans NOT DETECTED NOT DETECTED Final   Candida auris NOT DETECTED NOT DETECTED Final   Candida glabrata NOT DETECTED NOT DETECTED Final   Candida krusei NOT DETECTED NOT DETECTED Final   Candida parapsilosis NOT DETECTED NOT DETECTED Final   Candida tropicalis NOT DETECTED NOT DETECTED Final   Cryptococcus neoformans/gattii NOT DETECTED NOT DETECTED Final   CTX-M ESBL NOT DETECTED NOT DETECTED Final   Carbapenem resistance IMP NOT DETECTED NOT DETECTED Final   Carbapenem resistance KPC NOT DETECTED NOT DETECTED Final   Carbapenem resistance NDM NOT DETECTED NOT DETECTED Final   Carbapenem resist OXA 48 LIKE NOT DETECTED NOT DETECTED Final   Carbapenem resistance VIM NOT DETECTED NOT DETECTED Final    Comment: Performed at [facility], [address] [place], [place] [address]  SARS Coronavirus 2 by RT PCR (hospital order, performed in [facility] hospital lab) Nasopharyngeal Nasopharyngeal Swab     Status: None   Collection Time: 08/02/20 10:40 PM   Specimen: Nasopharyngeal Swab  Result Value Ref Range Status   SARS Coronavirus 2 NEGATIVE NEGATIVE Final    Comment: (NOTE) SARS-CoV-2 target nucleic acids are NOT DETECTED.  The SARS-CoV-2 RNA is generally detectable in upper and lower respiratory specimens during the acute phase of infection. The lowest concentration of SARS-CoV-2 viral copies this assay can detect is 250 copies / mL. A negative result does not preclude SARS-CoV-2 infection and should not be used as the sole basis for treatment or other patient management decisions.  A negative result may occur with improper specimen collection / handling, submission of specimen other than nasopharyngeal swab, presence of viral mutation(s) within the areas targeted by this assay, and inadequate number of viral copies (<250 copies / mL). A negative result must be combined with clinical observations, patient history, and  epidemiological information.  Fact Sheet for Patients:   StrictlyIdeas.no  Fact Sheet for Healthcare Providers: BankingDealers.co.za  This test is not yet approved or  cleared by the [place] FDA and has been authorized for detection and/or diagnosis of SARS-CoV-2 by FDA under an Emergency Use Authorization (EUA).  This EUA will remain in effect (meaning this test can be used) for the duration of the COVID-19 declaration under Section 564(b)(1) of the Act, 21 U.S.C. section 360bbb-3(b)(1), unless the authorization is terminated or revoked sooner.  Performed at [facility], [place] [address] [place], [place] [address]          Radiology Studies: IR Fluoro Guide CV Line Right  Result Date: 08/05/2020 INDICATION: History of Crohn's disease in short gut syndrome with chronic central venous catheter secondary to TPN dependency. Patient is originally from [place] and has had multiple neck catheters which reportedly has resulted in central venous occlusion and as such underweight left femoral approach tunneled central venous catheter placement in October of 2020. Shortly thereafter, the patient moved to [facility] and has since been managed by the interventional radiology department undergoing fluoroscopic guided exchanges of the tunneled  left femoral central venous catheter on 01/07/2020; 03/09/2020; 05/09/2020 and 07/20/2020. Patient presents today for central venous catheter evaluation and management in the setting of persistent bacteremia. Given persistent bacteremia despite recent fluoroscopic guided exchange performed 07/21/2019, the decision was made to proceed with placement of a new right common femoral vein approach tunneled central venous catheter in lieu of fluoroscopic guided exchange of the existing left common femoral approach central venous catheter. EXAM: 1. ULTRASOUND AND FLUOROSCOPIC GUIDED PLACEMENT OF TUNNELED  CENTRAL VENOUS CATHETER 2. BEDSIDE REMOVAL OF EXISTING LEFT COMMON FEMORAL VEIN APPROACH TUNNELED CENTRAL VENOUS CATHETER Comparisons: CT abdomen pelvis-08/02/2020; fluoroscopic guided central venous catheter exchange-07/20/2020; 05/09/2020; 03/09/2020; 01/07/2020 MEDICATIONS: Patient is currently admitted to the hospital receiving intravenous antibiotics. The antibiotic was given in an appropriate time interval prior to skin puncture. ANESTHESIA/SEDATION: Versed 2 mg IV; Fentanyl 100 mcg IV; Moderate Sedation Time:  11 The patient was continuously monitored during the procedure by the interventional radiology nurse under my direct supervision. FLUOROSCOPY TIME:  18 seconds (2 mGy) COMPLICATIONS: None immediate. PROCEDURE: Informed written consent was obtained from the patient after a discussion of the risks, benefits, and alternatives to treatment. Questions regarding the procedure were encouraged and answered. The right groin and upper thigh were prepped with chlorhexidine in a sterile fashion, and a sterile drape was applied covering the operative field. Maximum barrier sterile technique with sterile gowns and gloves were used for the procedure. A timeout was performed prior to the initiation of the procedure. After the overlying soft tissues were anesthetized, a small venotomy incision was created and a micropuncture kit was utilized to access the right common femoral vein. Real-time ultrasound guidance was utilized for vascular access including the acquisition of a permanent ultrasound image documenting patency of the accessed vessel. The microwire was utilized to measure appropriate catheter length. The micropuncture sheath was exchanged for a peel-away sheath over a guidewire. A 5 [place] dual lumen tunneled central venous catheter measuring 45 cm was tunneled in a retrograde fashion from the anterior lateral aspect of the right thigh to the venotomy incision. The catheter was then placed through the peel-away  sheath with tip ultimately positioned at the superior caval-atrial junction. Final catheter positioning was confirmed and documented with a spot radiographic image. The catheter aspirates and flushes normally. The catheter was flushed with appropriate volume heparin dwells. The catheter exit site was secured with a 0-Prolene retention suture. The venotomy incision was closed with Dermabond and Steri-strips. Dressings were applied. Attention was now paid towards removal of the left common femoral vein approach central catheter. The retention suture was removed and the catheter was removed intact with manual compression. A dressing applied. The patient tolerated the above procedures well without immediate complication. FINDINGS: After catheter placement, the tip lies within the inferior cavoatrial junction the catheter aspirates and flushes normally and is ready for immediate use. Successful bedside removal of left common femoral approach central venous catheter. IMPRESSION: 1. Successful placement of 45 cm dual lumen tunneled central venous catheter via the right common femoral vein with tip terminating at the superior caval atrial junction. The catheter is ready for immediate use. 2. Successful bedside removal of existing left common femoral approach tunneled central venous catheter. Electronically Signed   By: Sandi Mariscal M.D.   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 Patient is originally from [place]  and has had multiple neck catheters which reportedly has resulted in central venous occlusion and as such underweight left femoral approach tunneled central venous catheter placement in October of 2020. Shortly thereafter, the patient moved to [facility] and has since been managed by the interventional radiology department undergoing fluoroscopic guided  exchanges of the tunneled left femoral central venous catheter on 01/07/2020; 03/09/2020; 05/09/2020 and 07/20/2020. Patient presents today for central venous catheter evaluation and management in the setting of persistent bacteremia. Given persistent bacteremia despite recent fluoroscopic guided exchange performed 07/21/2019, the decision was made to proceed with placement of a new right common femoral vein approach tunneled central venous catheter in lieu of fluoroscopic guided exchange of the existing left common femoral approach central venous catheter. EXAM: 1. ULTRASOUND AND FLUOROSCOPIC GUIDED PLACEMENT OF TUNNELED CENTRAL VENOUS CATHETER 2. BEDSIDE REMOVAL OF EXISTING LEFT COMMON FEMORAL VEIN APPROACH TUNNELED CENTRAL VENOUS CATHETER Comparisons: CT abdomen pelvis-08/02/2020; fluoroscopic guided central venous catheter exchange-07/20/2020; 05/09/2020; 03/09/2020; 01/07/2020 MEDICATIONS: Patient is currently admitted to the hospital receiving intravenous antibiotics. The antibiotic was given in an appropriate time interval prior to skin puncture. ANESTHESIA/SEDATION: Versed 2 mg IV; Fentanyl 100 mcg IV; Moderate Sedation Time:  11 The patient was continuously monitored during the procedure by the interventional radiology nurse under my direct supervision. FLUOROSCOPY TIME:  18 seconds (2 mGy) COMPLICATIONS: None immediate. PROCEDURE: Informed written consent was obtained from the patient after a discussion of the risks, benefits, and alternatives to treatment. Questions regarding the procedure were encouraged and answered. The right groin and upper thigh were prepped with chlorhexidine in a sterile fashion, and a sterile drape was applied covering the operative field. Maximum barrier sterile technique with sterile gowns and gloves were used for the procedure. A timeout was performed prior to the initiation of the procedure. After the overlying soft tissues were anesthetized, a small venotomy incision was  created and a micropuncture kit was utilized to access the right common femoral vein. Real-time ultrasound guidance was utilized for vascular access including the acquisition of a permanent ultrasound image documenting patency of the accessed vessel. The microwire was utilized to measure appropriate catheter length. The micropuncture sheath was exchanged for a peel-away sheath over a guidewire. A 5 [place] dual lumen tunneled central venous catheter measuring 45 cm was tunneled in a retrograde fashion from the anterior lateral aspect of the right thigh to the venotomy incision. The catheter was then placed through the peel-away sheath with tip ultimately positioned at the superior caval-atrial junction. Final catheter positioning was confirmed and documented with a spot radiographic image. The catheter aspirates and flushes normally. The catheter was flushed with appropriate volume heparin dwells. The catheter exit site was secured with a 0-Prolene retention suture. The venotomy incision was closed with Dermabond and Steri-strips. Dressings were applied. Attention was now paid towards removal of the left common femoral vein approach central catheter. The retention suture was removed and the catheter was removed intact with manual compression. A dressing applied. The patient tolerated the above procedures well without immediate complication. FINDINGS: After catheter placement, the tip lies within the inferior cavoatrial junction the catheter aspirates and flushes normally and is ready for immediate use. Successful bedside removal of left common femoral approach central venous catheter. IMPRESSION: 1. Successful placement of 45 cm dual lumen tunneled central venous catheter via the right common femoral vein with tip terminating at the superior caval atrial junction. The catheter is ready for immediate use. 2. Successful bedside removal of existing left common femoral approach  tunneled central venous catheter.  Electronically Signed   By: Sandi Mariscal M.D.   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 Patient is originally from [place] and has had multiple neck catheters which reportedly has resulted in central venous occlusion and as such underweight left femoral approach tunneled central venous catheter placement in October of 2020. Shortly thereafter, the patient moved to [facility] and has since been managed by the interventional radiology department undergoing fluoroscopic guided exchanges of the tunneled left femoral central venous catheter on 01/07/2020; 03/09/2020; 05/09/2020 and 07/20/2020. Patient presents today for central venous catheter evaluation and management in the setting of persistent bacteremia. Given persistent bacteremia despite recent fluoroscopic guided exchange performed 07/21/2019, the decision was made to proceed with placement of a new right common femoral vein approach tunneled central venous catheter in lieu of fluoroscopic guided exchange of the existing left common femoral approach central venous catheter. EXAM: 1. ULTRASOUND AND FLUOROSCOPIC GUIDED PLACEMENT OF TUNNELED CENTRAL VENOUS CATHETER 2. BEDSIDE REMOVAL OF EXISTING LEFT COMMON FEMORAL VEIN APPROACH TUNNELED CENTRAL VENOUS CATHETER Comparisons: CT abdomen pelvis-08/02/2020; fluoroscopic guided central venous catheter exchange-07/20/2020; 05/09/2020; 03/09/2020; 01/07/2020 MEDICATIONS: Patient is currently admitted to the hospital receiving intravenous antibiotics. The antibiotic was given in an appropriate time interval prior to skin puncture. ANESTHESIA/SEDATION: Versed 2 mg IV; Fentanyl 100 mcg IV; Moderate Sedation Time:  11 The patient was continuously monitored during the procedure by the interventional radiology nurse under my direct supervision. FLUOROSCOPY TIME:  18  seconds (2 mGy) COMPLICATIONS: None immediate. PROCEDURE: Informed written consent was obtained from the patient after a discussion of the risks, benefits, and alternatives to treatment. Questions regarding the procedure were encouraged and answered. The right groin and upper thigh were prepped with chlorhexidine in a sterile fashion, and a sterile drape was applied covering the operative field. Maximum barrier sterile technique with sterile gowns and gloves were used for the procedure. A timeout was performed prior to the initiation of the procedure. After the overlying soft tissues were anesthetized, a small venotomy incision was created and a micropuncture kit was utilized to access the right common femoral vein. Real-time ultrasound guidance was utilized for vascular access including the acquisition of a permanent ultrasound image documenting patency of the accessed vessel. The microwire was utilized to measure appropriate catheter length. The micropuncture sheath was exchanged for a peel-away sheath over a guidewire. A 5 [place] dual lumen tunneled central venous catheter measuring 45 cm was tunneled in a retrograde fashion from the anterior lateral aspect of the right thigh to the venotomy incision. The catheter was then placed through the peel-away sheath with tip ultimately positioned at the superior caval-atrial junction. Final catheter positioning was confirmed and documented with a spot radiographic image. The catheter aspirates and flushes normally. The catheter was flushed with appropriate volume heparin dwells. The catheter exit site was secured with a 0-Prolene retention suture. The venotomy incision was closed with Dermabond and Steri-strips. Dressings were applied. Attention was now paid towards removal of the left common femoral vein approach central catheter. The retention suture was removed and the catheter was removed intact with manual compression. A dressing applied. The patient tolerated the  above procedures well without immediate complication. FINDINGS: After catheter placement, the tip lies within the inferior cavoatrial junction the catheter aspirates and flushes normally and is ready for immediate use. Successful bedside  removal of left common femoral approach central venous catheter. IMPRESSION: 1. Successful placement of 45 cm dual lumen tunneled central venous catheter via the right common femoral vein with tip terminating at the superior caval atrial junction. The catheter is ready for immediate use. 2. Successful bedside removal of existing left common femoral approach tunneled central venous catheter. Electronically Signed   By: Sandi Mariscal M.D.   On: 08/05/2020 14:41        Scheduled Meds:  amLODipine  10 mg Oral Daily   budesonide  3 mg Oral Daily   buPROPion  200 mg Oral Daily   Chlorhexidine Gluconate Cloth  6 each Topical Daily   cholecalciferol  1,000 Units Oral Daily   cycloSPORINE  1 drop Both Eyes BID   DULoxetine  90 mg Oral Daily   estradiol  2 mg Oral Daily   heparin  5,000 Units Subcutaneous Q8H   methadone  5 mg Oral 5 X Daily   metoprolol tartrate  100 mg Oral BID   omega-3 acid ethyl esters  1,000 mg Oral Daily   pantoprazole  40 mg Oral Daily   sucralfate  1 g Oral TID WC & HS   vitamin B-12  1,000 mcg Oral Daily   Continuous Infusions:  ceFEPime (MAXIPIME) IV 2 g (08/06/20 0248)   sodium bicarbonate (isotonic) 150 mEq in D5W 1000 mL infusion 75 mL/hr at 08/05/20 1650          Winfield Caba, MD [facility] 08/06/2020, 8:32 AM

## 2020-08-06 NOTE — Progress Notes (Signed)
PT Cancellation Note  Patient Details Name: Caitlin Peters MRN: [id number] DOB: 02/06/1961   Cancelled Treatment:     PT order received but eval deferred.  Pt reports mobilizing independently in room and feels she is basically at baseline.  RN verifies pt has been independent in room.  No PT needs identified at this time and PT services will sign off.   Izik Bingman 08/06/2020, 4:04 PM

## 2020-08-07 ENCOUNTER — Inpatient Hospital Stay (HOSPITAL_COMMUNITY): Payer: Medicare Other

## 2020-08-07 DIAGNOSIS — A4159 Other Gram-negative sepsis: Principal | ICD-10-CM

## 2020-08-07 LAB — BASIC METABOLIC PANEL
Anion gap: 8 (ref 5–15)
BUN: 12 mg/dL (ref 6–20)
CO2: 26 mmol/L (ref 22–32)
Calcium: 9.4 mg/dL (ref 8.9–10.3)
Chloride: 103 mmol/L (ref 98–111)
Creatinine, Ser: 1.24 mg/dL — ABNORMAL HIGH (ref 0.44–1.00)
GFR calc Af Amer: 55 mL/min — ABNORMAL LOW (ref >60)
GFR calc non Af Amer: 48 mL/min — ABNORMAL LOW (ref >60)
Glucose, Bld: 99 mg/dL (ref 70–99)
Potassium: 3.4 mmol/L — ABNORMAL LOW (ref 3.5–5.1)
Sodium: 137 mmol/L (ref 135–145)

## 2020-08-07 LAB — MAGNESIUM: Magnesium: 1.9 mg/dL (ref 1.7–2.4)

## 2020-08-07 MED ORDER — SENNOSIDES-DOCUSATE SODIUM 8.6-50 MG PO TABS
1.0000 | ORAL_TABLET | Freq: Two times a day (BID) | ORAL | Status: DC
  Administered 2020-08-07 – 2020-08-12 (×9): 1 via ORAL
  Filled 2020-08-07 (×10): qty 1

## 2020-08-07 MED ORDER — HYDROMORPHONE HCL 1 MG/ML IJ SOLN
1.0000 mg | INTRAMUSCULAR | Status: DC | PRN
  Administered 2020-08-07 – 2020-08-09 (×9): 1 mg via INTRAVENOUS
  Filled 2020-08-07 (×9): qty 1

## 2020-08-07 MED ORDER — BISACODYL 10 MG RE SUPP: 10.0000 mg | Freq: Every day | RECTAL | Status: DC | PRN

## 2020-08-07 MED ORDER — POLYETHYLENE GLYCOL 3350 17 G PO PACK: 17.0000 g | PACK | Freq: Every day | ORAL | Status: DC | PRN

## 2020-08-07 MED ORDER — HYDROMORPHONE HCL 1 MG/ML IJ SOLN
0.5000 mg | Freq: Once | INTRAMUSCULAR | Status: AC
  Administered 2020-08-07: 0.5 mg via INTRAVENOUS
  Filled 2020-08-07: qty 0.5

## 2020-08-07 MED ORDER — HYDROMORPHONE HCL 1 MG/ML IJ SOLN
1.0000 mg | Freq: Once | INTRAMUSCULAR | Status: AC
  Administered 2020-08-07: 1 mg via INTRAVENOUS
  Filled 2020-08-07: qty 1

## 2020-08-07 MED ORDER — POTASSIUM CHLORIDE CRYS ER 20 MEQ PO TBCR
40.0000 meq | EXTENDED_RELEASE_TABLET | Freq: Once | ORAL | Status: AC
  Administered 2020-08-07: 40 meq via ORAL
  Filled 2020-08-07: qty 2

## 2020-08-07 NOTE — Progress Notes (Signed)
Patient ID: Caitlin Peters, female   DOB: 01-08-1961, 59 y.o.   MRN: [id number]  PROGRESS NOTE    GAVRIELA CASHIN  ZSW:[id number] DOB: Dec 03, 1960 DOA: 08/02/2020 PCP: [name], [name], MD   Brief Narrative:  59 y.o. female with medical history significant of chronic diarrhea, hypertension, CKD stage III, chronic anemia, Crohn's disease, short gut syndrome/TPN dependent, chronic pain on methadone, malnutrition, anxiety, depression, tobacco use, recent hospital admission for Enterobacter and stenotrophomonas sepsis in the setting of long-term PICC line in the left thigh which was treated with IV antibiotics and subsequently oral Bactrim on discharge till 08/02/2020 presented on 08/02/2020 with fever.  On presentation, she was febrile with temperature of 100.8 along with tachycardia with hypotension along with leukocytosis of 18.5 creatinine of 2.5.  COVID-19 testing was negative.  Chest x-ray was negative for infiltrates.  CT of the abdomen and pelvis did not show any obvious infectious source.  She was started on broad-spectrum antibiotics and IV fluids.  She was found to have Enterobacter bacteremia.  ID was consulted.  Subsequently IR was also consulted.  Assessment & Plan:   Sepsis: Present on admission Enterobacter bacteremia -Patient was recently treated for stenotrophomonas and Enterobacter bacteremia, most likely related to her chronic femoral PICC line.  She received IV antibiotics and was discharged on oral Bactrim till 08/02/2020 but subsequently presented with fever the same day. -Currently has Enterobacter bacteremia.  Broad-spectrum antibiotics have been switched to IV cefepime.  Hemodynamically improving. -ID following and recommended IR evaluation.  Status post removal of existing left common femoral tunneled central venous catheter and placement of right common femoral vein tunneled central Venous catheter on 08/05/2020 by IR. -Repeat blood cultures from today. -Currently afebrile  and hemodynamically stable.  AKI on chronic kidney disease stage IIIb Anion gap metabolic acidosis -Probably from prerenal azotemia from dehydration/sepsis.  Presented with creatinine of 2.5.  Baseline creatinine 1.6-1.7.  CT without evidence of obstructive uropathy.  Creatinine 1.24 today.  Acidosis is resolved.  Off bicarb drip.  Hypokalemia -Replace.  Repeat a.m. labs  Hypomagnesemia -Improved  Anemia of chronic disease -Probably from chronic kidney disease.  Hemoglobin pending today.  No signs of bleeding.  Transfuse if hemoglobin is less than 7  Thrombocytopenia -Questionable cause.  Resolved  Crohn's disease/short gut syndrome -Continue budesonide.   TPN on hold for now.  If repeat blood cultures are negative, will resume TPN.  Chronic pain syndrome -Continue scheduled home methadone and as needed Dilaudid for breakthrough pain  Hypertension -Continue amlodipine and metoprolol.  Blood pressure on the higher side.  Monitor  DVT prophylaxis: Subcutaneous heparin Code Status: Full Family Communication: None at bedside Disposition Plan: Status is: Inpatient  Remains inpatient appropriate because:Inpatient level of care appropriate due to severity of illness.  Still requiring IV antibiotics.  Will need negative repeat blood cultures and final recommendations from ID.  Dispo: The patient is from: Home              Anticipated d/c is to: Home              Anticipated d/c date is: 2 days              Patient currently is not medically stable to d/c.   Consultants: ID. IR  Procedures:  removal of existing left common femoral tunneled central venous catheter and placement of right common femoral vein tunneled central Venous catheter on 08/05/2020 by IR.   Antimicrobials:  Anti-infectives (From admission, onward)  Start     Dose/Rate Route Frequency Ordered Stop   08/05/20 1500  ceFEPIme (MAXIPIME) 2 g in sodium chloride 0.9 % 100 mL IVPB        2 g 200 mL/hr over 30  Minutes Intravenous Every 12 hours 08/05/20 1346     08/04/20 2200  vancomycin (VANCOCIN) IVPB 1000 mg/200 mL premix  Status:  Discontinued        1,000 mg 200 mL/hr over 60 Minutes Intravenous Every 48 hours 08/03/20 0341 08/03/20 1514   08/03/20 1400  ceFEPIme (MAXIPIME) 2 g in sodium chloride 0.9 % 100 mL IVPB  Status:  Discontinued        2 g 200 mL/hr over 30 Minutes Intravenous Every 24 hours 08/03/20 1343 08/05/20 1346   08/03/20 1200  cefTAZidime (FORTAZ) 2 g in sodium chloride 0.9 % 100 mL IVPB  Status:  Discontinued        2 g 200 mL/hr over 30 Minutes Intravenous Every 12 hours 08/03/20 1044 08/03/20 1330   08/03/20 1000  piperacillin-tazobactam (ZOSYN) IVPB 3.375 g  Status:  Discontinued        3.375 g 12.5 mL/hr over 240 Minutes Intravenous Every 8 hours 08/03/20 0341 08/03/20 1044   08/03/20 0400  piperacillin-tazobactam (ZOSYN) IVPB 3.375 g        3.375 g 100 mL/hr over 30 Minutes Intravenous  Once 08/03/20 0352 08/03/20 0408   08/03/20 0345  piperacillin-tazobactam (ZOSYN) IVPB 3.375 g  Status:  Discontinued        3.375 g 12.5 mL/hr over 240 Minutes Intravenous  Once 08/03/20 0341 08/03/20 0351   08/03/20 0015  vancomycin (VANCOREADY) IVPB 1250 mg/250 mL        1,250 mg 166.7 mL/hr over 90 Minutes Intravenous  Once 08/03/20 0002 08/03/20 0257   08/02/20 1930  metroNIDAZOLE (FLAGYL) IVPB 500 mg        500 mg 100 mL/hr over 60 Minutes Intravenous  Once 08/02/20 1928 08/02/20 2311   08/02/20 1930  cefTRIAXone (ROCEPHIN) 1 g in sodium chloride 0.9 % 100 mL IVPB        1 g 200 mL/hr over 30 Minutes Intravenous  Once 08/02/20 1928 08/02/20 2200       Subjective: Patient seen and examined at bedside.  Denies overnight fever, vomiting, or diarrhea.  Had abdominal pain overnight.  Objective: Vitals:   08/06/20 0517 08/06/20 1325 08/06/20 2120 08/07/20 0516  BP: (!) 185/88 (!) 164/82 (!) 173/81 (!) 174/80  Pulse: (!) 51 98 (!) 54 (!) 41  Resp: 14 11 16 16   Temp: 98.7  F (37.1 C) 98.3 F (36.8 C) 98 F (36.7 C) 98.5 F (36.9 C)  TempSrc: Oral Oral Oral Oral  SpO2: 97% 100% 98% 99%    Intake/Output Summary (Last 24 hours) at 08/07/2020 0820 Last data filed at 08/07/2020 0514 Gross per 24 hour  Intake 920 ml  Output --  Net 920 ml   There were no vitals filed for this visit.  Examination:  General exam: No acute distress.  Looks chronically ill. Respiratory system: Bilateral decreased breath sounds at bases with some scattered crackles  cardiovascular system: S1-S2 heard, intermittently bradycardic  gastrointestinal system: Abdomen is nondistended, soft and nontender.  Bowel sounds are heard extremities: No cyanosis, clubbing or edema   Data Reviewed: I have personally reviewed following labs and imaging studies  CBC: Recent Labs  Lab 08/02/20 2020 08/03/20 0434 08/04/20 0521 08/05/20 1605 08/06/20 0551  WBC 18.5* 19.8* 7.2 3.4* 3.5*  NEUTROABS 17.2*  --  5.0 1.7 1.6*  HGB 8.1* 7.9* 7.3* 7.7* 7.8*  HCT 24.0* 24.9* 22.9* 23.7* 24.0*  MCV 92.3 94.0 95.0 93.7 92.3  PLT 146* 128* 104* 147* 528   Basic Metabolic Panel: Recent Labs  Lab 08/03/20 0434 08/04/20 0521 08/05/20 0605 08/06/20 0551 08/07/20 0502  NA 136 134* 138 139 137  K 4.6 4.4 4.2 3.4* 3.4*  CL 102 108 110 101 103  CO2 20* 20* 14* 28 26  GLUCOSE 100* 98 98 105* 99  BUN 44* 28* 19 14 12   CREATININE 2.29*  2.19* 2.17* 1.69* 1.23* 1.24*  CALCIUM 9.2 8.9 9.4 9.3 9.4  MG 1.8 2.0 1.7 1.6* 1.9  PHOS 3.7 3.3  --   --   --    GFR: CrCl cannot be calculated (Unknown ideal weight.). Liver Function Tests: Recent Labs  Lab 08/02/20 1856 08/03/20 0434 08/04/20 0521 08/05/20 0605  AST 78* 68* 36 33  ALT 80* 79* 55* 46*  ALKPHOS 360* 314* 259* 238*  BILITOT 1.4* 2.0* 0.9 0.8  PROT 7.7 6.7 6.6 6.6  ALBUMIN 3.2* 2.8* 2.6* 2.8*   Recent Labs  Lab 08/02/20 2020  LIPASE 24   No results for input(s): AMMONIA in the last 168 hours. Coagulation Profile: Recent  Labs  Lab 08/02/20 2020  INR 1.1   Cardiac Enzymes: No results for input(s): CKTOTAL, CKMB, CKMBINDEX, TROPONINI in the last 168 hours. BNP (last 3 results) No results for input(s): PROBNP in the last 8760 hours. HbA1C: No results for input(s): HGBA1C in the last 72 hours. CBG: No results for input(s): GLUCAP in the last 168 hours. Lipid Profile: No results for input(s): CHOL, HDL, LDLCALC, TRIG, CHOLHDL, LDLDIRECT in the last 72 hours. Thyroid Function Tests: No results for input(s): TSH, T4TOTAL, FREET4, T3FREE, THYROIDAB in the last 72 hours. Anemia Panel: No results for input(s): VITAMINB12, FOLATE, FERRITIN, TIBC, IRON, RETICCTPCT in the last 72 hours. Sepsis Labs: Recent Labs  Lab 08/02/20 1856 08/02/20 2020 08/04/20 0521  PROCALCITON  --   --  43.60  LATICACIDVEN 1.5 1.0  --     Recent Results (from the past 240 hour(s))  Blood Culture (routine x 2)     Status: Abnormal   Collection Time: 08/02/20  7:26 PM   Specimen: BLOOD  Result Value Ref Range Status   Specimen Description   Final    BLOOD BLOOD RIGHT ARM Performed at [facility] [address] [place], [place] [address]    Special Requests   Final    BOTTLES DRAWN AEROBIC AND ANAEROBIC Blood Culture results may not be optimal due to an excessive volume of blood received in culture bottles Performed at [facility] [address], [place], [place] [address]    Culture  Setup Time   Final    GRAM NEGATIVE RODS IN BOTH AEROBIC AND ANAEROBIC BOTTLES CRITICAL VALUE NOTED.  VALUE IS CONSISTENT WITH PREVIOUSLY REPORTED AND CALLED VALUE.    Culture (A)  Final    ENTEROBACTER CLOACAE SUSCEPTIBILITIES PERFORMED ON PREVIOUS CULTURE WITHIN THE LAST 5 DAYS. Performed at [facility], [place] [address] [place], [place] [address]    Report Status 08/05/2020 FINAL  Final  Urine culture     Status: None   Collection Time: 08/02/20  7:26 PM   Specimen: In/Out Cath Urine   Result Value Ref Range Status   Specimen Description   Final    IN/OUT CATH URINE Performed at [facility]  [place] [address] [place], [place] [address]    Special Requests   Final    NONE Performed at [facility], [place] [address], [place], [place] [address]    Culture   Final    NO GROWTH Performed at [facility], [place] [address] [place], [place] [address]    Report Status 08/04/2020 FINAL  Final  Blood Culture (routine x 2)     Status: Abnormal   Collection Time: 08/02/20  7:31 PM   Specimen: BLOOD  Result Value Ref Range Status   Specimen Description   Final    BLOOD FEM LINE Performed at [facility] [address] [place], [place] [address]    Special Requests   Final    BOTTLES DRAWN AEROBIC AND ANAEROBIC Blood Culture adequate volume Performed at [facility] [address] [place], [place] [address]    Culture  Setup Time   Final    GRAM NEGATIVE RODS IN BOTH AEROBIC AND ANAEROBIC BOTTLES Organism ID to follow CRITICAL RESULT CALLED TO, READ BACK BY AND VERIFIED WITH: J. LEGGE PHARMD, AT 1300 08/03/20 BY D. VANHOOK Performed at [facility], [place] [address] [place], [place] [address]    Culture ENTEROBACTER CLOACAE (A)  Final   Report Status 08/05/2020 FINAL  Final   Organism ID, Bacteria ENTEROBACTER CLOACAE  Final      Susceptibility   Enterobacter cloacae - MIC*    CEFAZOLIN >=64 RESISTANT Resistant     CEFEPIME <=0.12 SENSITIVE Sensitive     CEFTAZIDIME <=1 SENSITIVE Sensitive     CIPROFLOXACIN 0.5 SENSITIVE Sensitive     GENTAMICIN <=1 SENSITIVE Sensitive     IMIPENEM <=0.25 SENSITIVE Sensitive     TRIMETH/SULFA >=320 RESISTANT Resistant     PIP/TAZO <=4 SENSITIVE Sensitive     * ENTEROBACTER CLOACAE  Blood Culture ID Panel (Reflexed)     Status: Abnormal   Collection Time: 08/02/20  7:31 PM  Result Value Ref Range Status   Enterococcus faecalis NOT DETECTED NOT DETECTED Final    Enterococcus Faecium NOT DETECTED NOT DETECTED Final   Listeria monocytogenes NOT DETECTED NOT DETECTED Final   Staphylococcus species NOT DETECTED NOT DETECTED Final   Staphylococcus aureus (BCID) NOT DETECTED NOT DETECTED Final   Staphylococcus epidermidis NOT DETECTED NOT DETECTED Final   Staphylococcus lugdunensis NOT DETECTED NOT DETECTED Final   Streptococcus species NOT DETECTED NOT DETECTED Final   Streptococcus agalactiae NOT DETECTED NOT DETECTED Final   Streptococcus pneumoniae NOT DETECTED NOT DETECTED Final   Streptococcus pyogenes NOT DETECTED NOT DETECTED Final   A.calcoaceticus-baumannii NOT DETECTED NOT DETECTED Final   Bacteroides fragilis NOT DETECTED NOT DETECTED Final   Enterobacterales DETECTED (A) NOT DETECTED Final    Comment: Enterobacterales represent a large order of gram negative bacteria, not a single organism. CRITICAL RESULT CALLED TO, READ BACK BY AND VERIFIED WITH: J. LEGGE PHARMD, AT 1300 08/03/20 BY D. VANHOOK    Enterobacter cloacae complex DETECTED (A) NOT DETECTED Final    Comment: CRITICAL RESULT CALLED TO, READ BACK BY AND VERIFIED WITH: J. LEGGE PHARMD, AT 1300 08/03/20 BY D. VANHOOK    Escherichia coli NOT DETECTED NOT DETECTED Final   Klebsiella aerogenes NOT DETECTED NOT DETECTED Final   Klebsiella oxytoca NOT DETECTED NOT DETECTED Final   Klebsiella pneumoniae NOT DETECTED NOT DETECTED Final   Proteus species NOT DETECTED NOT DETECTED Final   Salmonella species NOT DETECTED NOT DETECTED  Final   Serratia marcescens NOT DETECTED NOT DETECTED Final   Haemophilus influenzae NOT DETECTED NOT DETECTED Final   Neisseria meningitidis NOT DETECTED NOT DETECTED Final   Pseudomonas aeruginosa NOT DETECTED NOT DETECTED Final   Stenotrophomonas maltophilia NOT DETECTED NOT DETECTED Final   Candida albicans NOT DETECTED NOT DETECTED Final   Candida auris NOT DETECTED NOT DETECTED Final   Candida glabrata NOT DETECTED NOT DETECTED Final   Candida krusei  NOT DETECTED NOT DETECTED Final   Candida parapsilosis NOT DETECTED NOT DETECTED Final   Candida tropicalis NOT DETECTED NOT DETECTED Final   Cryptococcus neoformans/gattii NOT DETECTED NOT DETECTED Final   CTX-M ESBL NOT DETECTED NOT DETECTED Final   Carbapenem resistance IMP NOT DETECTED NOT DETECTED Final   Carbapenem resistance KPC NOT DETECTED NOT DETECTED Final   Carbapenem resistance NDM NOT DETECTED NOT DETECTED Final   Carbapenem resist OXA 48 LIKE NOT DETECTED NOT DETECTED Final   Carbapenem resistance VIM NOT DETECTED NOT DETECTED Final    Comment: Performed at [facility], [address], [place], [place] [address]  SARS Coronavirus 2 by RT PCR (hospital order, performed in [facility] hospital lab) Nasopharyngeal Nasopharyngeal Swab     Status: None   Collection Time: 08/02/20 10:40 PM   Specimen: Nasopharyngeal Swab  Result Value Ref Range Status   SARS Coronavirus 2 NEGATIVE NEGATIVE Final    Comment: (NOTE) SARS-CoV-2 target nucleic acids are NOT DETECTED.  The SARS-CoV-2 RNA is generally detectable in upper and lower respiratory specimens during the acute phase of infection. The lowest concentration of SARS-CoV-2 viral copies this assay can detect is 250 copies / mL. A negative result does not preclude SARS-CoV-2 infection and should not be used as the sole basis for treatment or other patient management decisions.  A negative result may occur with improper specimen collection / handling, submission of specimen other than nasopharyngeal swab, presence of viral mutation(s) within the areas targeted by this assay, and inadequate number of viral copies (<250 copies / mL). A negative result must be combined with clinical observations, patient history, and epidemiological information.  Fact Sheet for Patients:   StrictlyIdeas.no  Fact Sheet for Healthcare Providers: BankingDealers.co.za  This test is not yet  approved or  cleared by the [place] FDA and has been authorized for detection and/or diagnosis of SARS-CoV-2 by FDA under an Emergency Use Authorization (EUA).  This EUA will remain in effect (meaning this test can be used) for the duration of the COVID-19 declaration under Section 564(b)(1) of the Act, 21 U.S.C. section 360bbb-3(b)(1), unless the authorization is terminated or revoked sooner.  Performed at [facility], [place] [address] [place],  [address]          Radiology Studies: IR Fluoro Guide CV Line Right  Result Date: 08/05/2020 INDICATION: History of Crohn's disease in short gut syndrome with chronic central venous catheter secondary to TPN dependency. Patient is originally from [place] and has had multiple neck catheters which reportedly has resulted in central venous occlusion and as such underweight left femoral approach tunneled central venous catheter placement in October of 2020. Shortly thereafter, the patient moved to [facility] and has since been managed by the interventional radiology department undergoing fluoroscopic guided exchanges of the tunneled left femoral central venous catheter on 01/07/2020; 03/09/2020; 05/09/2020 and 07/20/2020. Patient presents today for central venous catheter evaluation and management in the setting of persistent bacteremia. Given persistent bacteremia despite recent fluoroscopic guided exchange performed 07/21/2019, the  decision was made to proceed with placement of a new right common femoral vein approach tunneled central venous catheter in lieu of fluoroscopic guided exchange of the existing left common femoral approach central venous catheter. EXAM: 1. ULTRASOUND AND FLUOROSCOPIC GUIDED PLACEMENT OF TUNNELED CENTRAL VENOUS CATHETER 2. BEDSIDE REMOVAL OF EXISTING LEFT COMMON FEMORAL VEIN APPROACH TUNNELED CENTRAL VENOUS CATHETER Comparisons: CT abdomen pelvis-08/02/2020; fluoroscopic guided central venous catheter  exchange-07/20/2020; 05/09/2020; 03/09/2020; 01/07/2020 MEDICATIONS: Patient is currently admitted to the hospital receiving intravenous antibiotics. The antibiotic was given in an appropriate time interval prior to skin puncture. ANESTHESIA/SEDATION: Versed 2 mg IV; Fentanyl 100 mcg IV; Moderate Sedation Time:  11 The patient was continuously monitored during the procedure by the interventional radiology nurse under my direct supervision. FLUOROSCOPY TIME:  18 seconds (2 mGy) COMPLICATIONS: None immediate. PROCEDURE: Informed written consent was obtained from the patient after a discussion of the risks, benefits, and alternatives to treatment. Questions regarding the procedure were encouraged and answered. The right groin and upper thigh were prepped with chlorhexidine in a sterile fashion, and a sterile drape was applied covering the operative field. Maximum barrier sterile technique with sterile gowns and gloves were used for the procedure. A timeout was performed prior to the initiation of the procedure. After the overlying soft tissues were anesthetized, a small venotomy incision was created and a micropuncture kit was utilized to access the right common femoral vein. Real-time ultrasound guidance was utilized for vascular access including the acquisition of a permanent ultrasound image documenting patency of the accessed vessel. The microwire was utilized to measure appropriate catheter length. The micropuncture sheath was exchanged for a peel-away sheath over a guidewire. A 5 [place] dual lumen tunneled central venous catheter measuring 45 cm was tunneled in a retrograde fashion from the anterior lateral aspect of the right thigh to the venotomy incision. The catheter was then placed through the peel-away sheath with tip ultimately positioned at the superior caval-atrial junction. Final catheter positioning was confirmed and documented with a spot radiographic image. The catheter aspirates and flushes  normally. The catheter was flushed with appropriate volume heparin dwells. The catheter exit site was secured with a 0-Prolene retention suture. The venotomy incision was closed with Dermabond and Steri-strips. Dressings were applied. Attention was now paid towards removal of the left common femoral vein approach central catheter. The retention suture was removed and the catheter was removed intact with manual compression. A dressing applied. The patient tolerated the above procedures well without immediate complication. FINDINGS: After catheter placement, the tip lies within the inferior cavoatrial junction the catheter aspirates and flushes normally and is ready for immediate use. Successful bedside removal of left common femoral approach central venous catheter. IMPRESSION: 1. Successful placement of 45 cm dual lumen tunneled central venous catheter via the right common femoral vein with tip terminating at the superior caval atrial junction. The catheter is ready for immediate use. 2. Successful bedside removal of existing left common femoral approach tunneled central venous catheter. Electronically Signed   By: Sandi Mariscal M.D.   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 Patient is originally from [place] and has had multiple neck catheters which reportedly has resulted in central venous occlusion and as such underweight left femoral approach tunneled central venous catheter placement in October of 2020. Shortly thereafter, the patient moved to [place] and  has since been managed by the interventional radiology department undergoing fluoroscopic guided exchanges of the tunneled left femoral central venous catheter on 01/07/2020; 03/09/2020; 05/09/2020 and 07/20/2020. Patient presents today for central venous catheter evaluation and management in the  setting of persistent bacteremia. Given persistent bacteremia despite recent fluoroscopic guided exchange performed 07/21/2019, the decision was made to proceed with placement of a new right common femoral vein approach tunneled central venous catheter in lieu of fluoroscopic guided exchange of the existing left common femoral approach central venous catheter. EXAM: 1. ULTRASOUND AND FLUOROSCOPIC GUIDED PLACEMENT OF TUNNELED CENTRAL VENOUS CATHETER 2. BEDSIDE REMOVAL OF EXISTING LEFT COMMON FEMORAL VEIN APPROACH TUNNELED CENTRAL VENOUS CATHETER Comparisons: CT abdomen pelvis-08/02/2020; fluoroscopic guided central venous catheter exchange-07/20/2020; 05/09/2020; 03/09/2020; 01/07/2020 MEDICATIONS: Patient is currently admitted to the hospital receiving intravenous antibiotics. The antibiotic was given in an appropriate time interval prior to skin puncture. ANESTHESIA/SEDATION: Versed 2 mg IV; Fentanyl 100 mcg IV; Moderate Sedation Time:  11 The patient was continuously monitored during the procedure by the interventional radiology nurse under my direct supervision. FLUOROSCOPY TIME:  18 seconds (2 mGy) COMPLICATIONS: None immediate. PROCEDURE: Informed written consent was obtained from the patient after a discussion of the risks, benefits, and alternatives to treatment. Questions regarding the procedure were encouraged and answered. The right groin and upper thigh were prepped with chlorhexidine in a sterile fashion, and a sterile drape was applied covering the operative field. Maximum barrier sterile technique with sterile gowns and gloves were used for the procedure. A timeout was performed prior to the initiation of the procedure. After the overlying soft tissues were anesthetized, a small venotomy incision was created and a micropuncture kit was utilized to access the right common femoral vein. Real-time ultrasound guidance was utilized for vascular access including the acquisition of a permanent ultrasound  image documenting patency of the accessed vessel. The microwire was utilized to measure appropriate catheter length. The micropuncture sheath was exchanged for a peel-away sheath over a guidewire. A 5 [place] dual lumen tunneled central venous catheter measuring 45 cm was tunneled in a retrograde fashion from the anterior lateral aspect of the right thigh to the venotomy incision. The catheter was then placed through the peel-away sheath with tip ultimately positioned at the superior caval-atrial junction. Final catheter positioning was confirmed and documented with a spot radiographic image. The catheter aspirates and flushes normally. The catheter was flushed with appropriate volume heparin dwells. The catheter exit site was secured with a 0-Prolene retention suture. The venotomy incision was closed with Dermabond and Steri-strips. Dressings were applied. Attention was now paid towards removal of the left common femoral vein approach central catheter. The retention suture was removed and the catheter was removed intact with manual compression. A dressing applied. The patient tolerated the above procedures well without immediate complication. FINDINGS: After catheter placement, the tip lies within the inferior cavoatrial junction the catheter aspirates and flushes normally and is ready for immediate use. Successful bedside removal of left common femoral approach central venous catheter. IMPRESSION: 1. Successful placement of 45 cm dual lumen tunneled central venous catheter via the right common femoral vein with tip terminating at the superior caval atrial junction. The catheter is ready for immediate use. 2. Successful bedside removal of existing left common femoral approach tunneled central venous catheter. Electronically Signed   By: Sandi Mariscal M.D.   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 Patient is originally from [place] and has had multiple neck catheters which reportedly has resulted in central venous occlusion and as such underweight left femoral approach tunneled central venous catheter placement in October of 2020. Shortly thereafter, the patient moved to [facility] and has since been managed by the interventional radiology department undergoing fluoroscopic guided exchanges of the tunneled left femoral central venous catheter on 01/07/2020; 03/09/2020; 05/09/2020 and 07/20/2020. Patient presents today for central venous catheter evaluation and management in the setting of persistent bacteremia. Given persistent bacteremia despite recent fluoroscopic guided exchange performed 07/21/2019, the decision was made to proceed with placement of a new right common femoral vein approach tunneled central venous catheter in lieu of fluoroscopic guided exchange of the existing left common femoral approach central venous catheter. EXAM: 1. ULTRASOUND AND FLUOROSCOPIC GUIDED PLACEMENT OF TUNNELED CENTRAL VENOUS CATHETER 2. BEDSIDE REMOVAL OF EXISTING LEFT COMMON FEMORAL VEIN APPROACH TUNNELED CENTRAL VENOUS CATHETER Comparisons: CT abdomen pelvis-08/02/2020; fluoroscopic guided central venous catheter exchange-07/20/2020; 05/09/2020; 03/09/2020; 01/07/2020 MEDICATIONS: Patient is currently admitted to the hospital receiving intravenous antibiotics. The antibiotic was given in an appropriate time interval prior to skin puncture. ANESTHESIA/SEDATION: Versed 2 mg IV; Fentanyl 100 mcg IV; Moderate Sedation Time:  11 The patient was continuously monitored during the procedure by the interventional radiology nurse under my direct supervision. FLUOROSCOPY TIME:  18 seconds (2 mGy) COMPLICATIONS: None immediate. PROCEDURE: Informed written consent was obtained from the patient after a discussion of the risks, benefits, and alternatives to treatment. Questions regarding the  procedure were encouraged and answered. The right groin and upper thigh were prepped with chlorhexidine in a sterile fashion, and a sterile drape was applied covering the operative field. Maximum barrier sterile technique with sterile gowns and gloves were used for the procedure. A timeout was performed prior to the initiation of the procedure. After the overlying soft tissues were anesthetized, a small venotomy incision was created and a micropuncture kit was utilized to access the right common femoral vein. Real-time ultrasound guidance was utilized for vascular access including the acquisition of a permanent ultrasound image documenting patency of the accessed vessel. The microwire was utilized to measure appropriate catheter length. The micropuncture sheath was exchanged for a peel-away sheath over a guidewire. A 5 [place] dual lumen tunneled central venous catheter measuring 45 cm was tunneled in a retrograde fashion from the anterior lateral aspect of the right thigh to the venotomy incision. The catheter was then placed through the peel-away sheath with tip ultimately positioned at the superior caval-atrial junction. Final catheter positioning was confirmed and documented with a spot radiographic image. The catheter aspirates and flushes normally. The catheter was flushed with appropriate volume heparin dwells. The catheter exit site was secured with a 0-Prolene retention suture. The venotomy incision was closed with Dermabond and Steri-strips. Dressings were applied. Attention was now paid towards removal of the left common femoral vein approach central catheter. The retention suture was removed and the catheter was removed intact with manual compression. A dressing applied. The patient tolerated the above procedures well without immediate complication. FINDINGS: After catheter placement, the tip lies within the inferior cavoatrial junction the catheter aspirates and flushes normally and is ready for  immediate use. Successful bedside removal of left common femoral approach central venous catheter. IMPRESSION: 1. Successful placement of 45 cm dual lumen tunneled central venous catheter via the right common femoral vein with tip terminating at the superior caval atrial junction. The  catheter is ready for immediate use. 2. Successful bedside removal of existing left common femoral approach tunneled central venous catheter. Electronically Signed   By: Sandi Mariscal M.D.   On: 08/05/2020 14:41        Scheduled Meds: . amLODipine  10 mg Oral Daily  . budesonide  3 mg Oral Daily  . buPROPion  200 mg Oral Daily  . Chlorhexidine Gluconate Cloth  6 each Topical Daily  . cholecalciferol  1,000 Units Oral Daily  . cycloSPORINE  1 drop Both Eyes BID  . DULoxetine  90 mg Oral Daily  . estradiol  2 mg Oral Daily  . heparin  5,000 Units Subcutaneous Q8H  . methadone  5 mg Oral 5 X Daily  . metoprolol tartrate  100 mg Oral BID  . omega-3 acid ethyl esters  1,000 mg Oral Daily  . pantoprazole  40 mg Oral Daily  . sucralfate  1 g Oral TID WC & HS  . vitamin B-12  1,000 mcg Oral Daily   Continuous Infusions: . ceFEPime (MAXIPIME) IV 2 g (08/07/20 0435)          Aline August, MD [facility] 08/07/2020, 8:20 AM

## 2020-08-08 ENCOUNTER — Encounter (HOSPITAL_COMMUNITY): Payer: Self-pay | Admitting: Internal Medicine

## 2020-08-08 ENCOUNTER — Inpatient Hospital Stay (HOSPITAL_COMMUNITY): Payer: Medicare Other

## 2020-08-08 LAB — CBC WITH DIFFERENTIAL/PLATELET
Abs Immature Granulocytes: 0.01 10*3/uL (ref 0.00–0.07)
Basophils Absolute: 0 10*3/uL (ref 0.0–0.1)
Basophils Relative: 0 %
Eosinophils Absolute: 0.1 10*3/uL (ref 0.0–0.5)
Eosinophils Relative: 3 %
HCT: 26.4 % — ABNORMAL LOW (ref 36.0–46.0)
Hemoglobin: 8.6 g/dL — ABNORMAL LOW (ref 12.0–15.0)
Immature Granulocytes: 0 %
Lymphocytes Relative: 46 %
Lymphs Abs: 1.9 10*3/uL (ref 0.7–4.0)
MCH: 30 pg (ref 26.0–34.0)
MCHC: 32.6 g/dL (ref 30.0–36.0)
MCV: 92 fL (ref 80.0–100.0)
Monocytes Absolute: 0.2 10*3/uL (ref 0.1–1.0)
Monocytes Relative: 5 %
Neutro Abs: 1.9 10*3/uL (ref 1.7–7.7)
Neutrophils Relative %: 46 %
Platelets: 177 10*3/uL (ref 150–400)
RBC: 2.87 MIL/uL — ABNORMAL LOW (ref 3.87–5.11)
RDW: 14.2 % (ref 11.5–15.5)
WBC: 4.2 10*3/uL (ref 4.0–10.5)
nRBC: 0 % (ref 0.0–0.2)

## 2020-08-08 LAB — BASIC METABOLIC PANEL
Anion gap: 7 (ref 5–15)
BUN: 15 mg/dL (ref 6–20)
CO2: 25 mmol/L (ref 22–32)
Calcium: 9.6 mg/dL (ref 8.9–10.3)
Chloride: 104 mmol/L (ref 98–111)
Creatinine, Ser: 1.36 mg/dL — ABNORMAL HIGH (ref 0.44–1.00)
GFR calc Af Amer: 49 mL/min — ABNORMAL LOW (ref >60)
GFR calc non Af Amer: 42 mL/min — ABNORMAL LOW (ref >60)
Glucose, Bld: 102 mg/dL — ABNORMAL HIGH (ref 70–99)
Potassium: 3.6 mmol/L (ref 3.5–5.1)
Sodium: 136 mmol/L (ref 135–145)

## 2020-08-08 LAB — MAGNESIUM: Magnesium: 1.9 mg/dL (ref 1.7–2.4)

## 2020-08-08 MED ORDER — SODIUM CHLORIDE 0.9 % IV SOLN
INTRAVENOUS | Status: DC | PRN
  Administered 2020-08-08: 250 mL via INTRAVENOUS

## 2020-08-08 NOTE — Progress Notes (Signed)
Pharmacy Antibiotic Note  Caitlin Peters is a 59 y.o. 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 Left femoral line pulled and right placed by IR 9/10. Pharmacy has been consulted for cefepime dosing.   08/08/20 8:01 AM  - SCr improved to 1.36 - CrCl ~ 45 ml/min - afebrile - WBC 4.2 - D6 antibiotics, D4 from line removal  Plan: Continue cefepime to 2 g iv q 12 hours  F/U renal function, clinical course  Planned duration 14 days post line removal  Height: 5' 8"  (172.7 cm) Weight: 66 kg (145 lb 8.1 oz) IBW/kg (Calculated) : 63.9   Temp (24hrs), Avg:98.5 F (36.9 C), Min:98.2 F (36.8 C), Max:98.8 F (37.1 C)  Recent Labs  Lab 08/02/20 1856 08/02/20 2020 08/02/20 2020 08/03/20 0434 08/03/20 0434 08/04/20 0521 08/05/20 0605 08/05/20 1605 08/06/20 0551 08/07/20 0502 08/08/20 0525  WBC  --  18.5*   < > 19.8*  --  7.2  --  3.4* 3.5*  --  4.2  CREATININE 2.56*  --    < > 2.29*  2.19*   < > 2.17* 1.69*  --  1.23* 1.24* 1.36*  LATICACIDVEN 1.5 1.0  --   --   --   --   --   --   --   --   --    < > = values in this interval not displayed.    Estimated Creatinine Clearance: 44.9 mL/min (A) (by C-G formula based on SCr of 1.36 mg/dL (H)).    Allergies  Allergen Reactions  . Cefepime Other (See Comments)    Neurotoxicity occurring in setting of AKI. Ceftriaxone tolerated during same admit  . Gabapentin Other (See Comments)    unknown  . Hyoscyamine Hives and Swelling    Legs swelling   Disorientation  . Lyrica [Pregabalin] Other (See Comments)    unknown  . Meperidine Hives    Other reaction(s): GI Upset Due to Ch[place]   . Topamax [Topiramate] Other (See Comments)    unknown  . Zosyn [Piperacillin Sod-Tazobactam So]     Patient reports it makes her vomit, her neck stiff, and her "heart feel funny"  . Fentanyl Rash    Pt is allergic to fentanyl patch related to  the glue (gives her a rash) Pt states she is NOT allergic to fentanyl IV medicine  . Morphine And Related Rash    Antimicrobials this admission: 9/7 metronidazole x1  9/8 vancomycin >> 9/8 9/8 Zosyn >> 9/8 9/8 cefepime >>   Dose adjustments this admission: 9/10 cefepime inc  Microbiology results: 9/7 BCx: Enterobacter cloacae R cefazolin and Septra only 9/7 UCx:  ngf 9/12 BCx: ngtd    Thank you for allowing pharmacy to be a part of this patient's care.  Ulice Dash, PharmD, BCPS [phone] 08/08/2020 8:01 AM

## 2020-08-08 NOTE — Progress Notes (Signed)
Patient ID: Caitlin Peters, female   DOB: May 13, 1961, 59 y.o.   MRN: [id number]  PROGRESS NOTE    LASHANNON BRESNAN  ZSM:[id number] DOB: June 29, 1961 DOA: 08/02/2020 PCP: [name], [name], MD   Brief Narrative:  59 y.o. female with medical history significant of chronic diarrhea, hypertension, CKD stage III, chronic anemia, Crohn's disease, short gut syndrome/TPN dependent, chronic pain on methadone, malnutrition, anxiety, depression, tobacco use, recent hospital admission for Enterobacter and stenotrophomonas sepsis in the setting of long-term PICC line in the left thigh which was treated with IV antibiotics and subsequently oral Bactrim on discharge till 08/02/2020 presented on 08/02/2020 with fever.  On presentation, she was febrile with temperature of 100.8 along with tachycardia with hypotension along with leukocytosis of 18.5 creatinine of 2.5.  COVID-19 testing was negative.  Chest x-ray was negative for infiltrates.  CT of the abdomen and pelvis did not show any obvious infectious source.  She was started on broad-spectrum antibiotics and IV fluids.  She was found to have Enterobacter bacteremia.  ID was consulted.  Subsequently IR was also consulted.  Assessment & Plan:   Sepsis: Present on admission Enterobacter bacteremia -Patient was recently treated for stenotrophomonas and Enterobacter bacteremia, most likely related to her chronic femoral PICC line.  She received IV antibiotics and was discharged on oral Bactrim till 08/02/2020 but subsequently presented with fever the same day. -Currently has Enterobacter bacteremia.  Broad-spectrum antibiotics have been switched to IV cefepime.  Hemodynamically improving. -ID following and recommended IR evaluation.  Status post removal of existing left common femoral tunneled central venous catheter and placement of right common femoral vein tunneled central Venous catheter on 08/05/2020 by IR. -Repeat blood cultures from 08/07/2020 are negative so  far. -Currently afebrile and hemodynamically stable.  AKI on chronic kidney disease stage IIIb Anion gap metabolic acidosis -Probably from prerenal azotemia from dehydration/sepsis.  Presented with creatinine of 2.5.  Baseline creatinine 1.6-1.7.  CT without evidence of obstructive uropathy.  Creatinine 1.36 today.  Acidosis is resolved.  Off bicarb drip.  Hypokalemia -Improved.  Hypomagnesemia -Improved  Anemia of chronic disease -Probably from chronic kidney disease.  Hemoglobin 8.6 today.  No signs of bleeding.  Transfuse if hemoglobin is less than 7  Thrombocytopenia -Questionable cause.  Resolved  Crohn's disease/short gut syndrome -Continue budesonide.   TPN on hold for now.  Resume TPN if okay with ID.  Chronic pain syndrome -Continue scheduled home methadone.  Continue IV Dilaudid for now and switch to oral Dilaudid once abdominal pain improves.  Hypertension -Continue amlodipine and metoprolol.  Blood pressure on the higher side.  Monitor  DVT prophylaxis: Subcutaneous heparin Code Status: Full Family Communication: None at bedside Disposition Plan: Status is: Inpatient  Remains inpatient appropriate because:Inpatient level of care appropriate due to severity of illness.  Still requiring IV antibiotics.  DC home in 1 to 2 days once cleared by ID and once tolerating resumption of TPN.  Dispo: The patient is from: Home              Anticipated d/c is to: Home              Anticipated d/c date is: 1 day              Patient currently is not medically stable to d/c.   Consultants: ID. IR  Procedures:  removal of existing left common femoral tunneled central venous catheter and placement of right common femoral vein tunneled central Venous catheter on 08/05/2020  by IR.   Antimicrobials:  Anti-infectives (From admission, onward)   Start     Dose/Rate Route Frequency Ordered Stop   08/05/20 1500  ceFEPIme (MAXIPIME) 2 g in sodium chloride 0.9 % 100 mL IVPB         2 g 200 mL/hr over 30 Minutes Intravenous Every 12 hours 08/05/20 1346     08/04/20 2200  vancomycin (VANCOCIN) IVPB 1000 mg/200 mL premix  Status:  Discontinued        1,000 mg 200 mL/hr over 60 Minutes Intravenous Every 48 hours 08/03/20 0341 08/03/20 1514   08/03/20 1400  ceFEPIme (MAXIPIME) 2 g in sodium chloride 0.9 % 100 mL IVPB  Status:  Discontinued        2 g 200 mL/hr over 30 Minutes Intravenous Every 24 hours 08/03/20 1343 08/05/20 1346   08/03/20 1200  cefTAZidime (FORTAZ) 2 g in sodium chloride 0.9 % 100 mL IVPB  Status:  Discontinued        2 g 200 mL/hr over 30 Minutes Intravenous Every 12 hours 08/03/20 1044 08/03/20 1330   08/03/20 1000  piperacillin-tazobactam (ZOSYN) IVPB 3.375 g  Status:  Discontinued        3.375 g 12.5 mL/hr over 240 Minutes Intravenous Every 8 hours 08/03/20 0341 08/03/20 1044   08/03/20 0400  piperacillin-tazobactam (ZOSYN) IVPB 3.375 g        3.375 g 100 mL/hr over 30 Minutes Intravenous  Once 08/03/20 0352 08/03/20 0408   08/03/20 0345  piperacillin-tazobactam (ZOSYN) IVPB 3.375 g  Status:  Discontinued        3.375 g 12.5 mL/hr over 240 Minutes Intravenous  Once 08/03/20 0341 08/03/20 0351   08/03/20 0015  vancomycin (VANCOREADY) IVPB 1250 mg/250 mL        1,250 mg 166.7 mL/hr over 90 Minutes Intravenous  Once 08/03/20 0002 08/03/20 0257   08/02/20 1930  metroNIDAZOLE (FLAGYL) IVPB 500 mg        500 mg 100 mL/hr over 60 Minutes Intravenous  Once 08/02/20 1928 08/02/20 2311   08/02/20 1930  cefTRIAXone (ROCEPHIN) 1 g in sodium chloride 0.9 % 100 mL IVPB        1 g 200 mL/hr over 30 Minutes Intravenous  Once 08/02/20 1928 08/02/20 2200       Subjective: Patient seen and examined at bedside.  Complains of intermittent abdominal pain and feels slightly better with IV Dilaudid.  No constipation.  Having loose bowel movements.  No overnight fever or vomiting. Objective: Vitals:   08/07/20 1410 08/07/20 2024 08/08/20 0627 08/08/20 0743  BP:  (!) 158/88 (!) 178/84 (!) 146/88   Pulse: (!) 51 (!) 105 (!) 109   Resp: 13 19 20    Temp: 98.8 F (37.1 C) 98.4 F (36.9 C) 98.2 F (36.8 C)   TempSrc: Oral Oral Oral   SpO2: 98% 100% 99%   Weight:    66 kg  Height:    5' 8"  (1.727 m)    Intake/Output Summary (Last 24 hours) at 08/08/2020 1011 Last data filed at 08/08/2020 0600 Gross per 24 hour  Intake 300 ml  Output --  Net 300 ml   Filed Weights   08/08/20 0743  Weight: 66 kg    Examination:  General exam: No distress.  Looks chronically ill. Respiratory system: Bilateral decreased breath sounds at bases with scattered crackles.  No wheezing  cardiovascular system: S1-S2 heard, tachycardic intermittently gastrointestinal system: Abdomen is nondistended, soft and mildly tender in the lower quadrant.  Bowel sounds are heard extremities: No edema clubbing.  Data Reviewed: I have personally reviewed following labs and imaging studies  CBC: Recent Labs  Lab 08/02/20 2020 08/02/20 2020 08/03/20 0434 08/04/20 0521 08/05/20 1605 08/06/20 0551 08/08/20 0525  WBC 18.5*   < > 19.8* 7.2 3.4* 3.5* 4.2  NEUTROABS 17.2*  --   --  5.0 1.7 1.6* 1.9  HGB 8.1*   < > 7.9* 7.3* 7.7* 7.8* 8.6*  HCT 24.0*   < > 24.9* 22.9* 23.7* 24.0* 26.4*  MCV 92.3   < > 94.0 95.0 93.7 92.3 92.0  PLT 146*   < > 128* 104* 147* 152 177   < > = values in this interval not displayed.   Basic Metabolic Panel: Recent Labs  Lab 08/03/20 0434 08/03/20 0434 08/04/20 0521 08/05/20 0605 08/06/20 0551 08/07/20 0502 08/08/20 0525  NA 136   < > 134* 138 139 137 136  K 4.6   < > 4.4 4.2 3.4* 3.4* 3.6  CL 102   < > 108 110 101 103 104  CO2 20*   < > 20* 14* 28 26 25   GLUCOSE 100*   < > 98 98 105* 99 102*  BUN 44*   < > 28* 19 14 12 15   CREATININE 2.29*  2.19*   < > 2.17* 1.69* 1.23* 1.24* 1.36*  CALCIUM 9.2   < > 8.9 9.4 9.3 9.4 9.6  MG 1.8   < > 2.0 1.7 1.6* 1.9 1.9  PHOS 3.7  --  3.3  --   --   --   --    < > = values in this interval not  displayed.   GFR: Estimated Creatinine Clearance: 44.9 mL/min (A) (by C-G formula based on SCr of 1.36 mg/dL (H)). Liver Function Tests: Recent Labs  Lab 08/02/20 1856 08/03/20 0434 08/04/20 0521 08/05/20 0605  AST 78* 68* 36 33  ALT 80* 79* 55* 46*  ALKPHOS 360* 314* 259* 238*  BILITOT 1.4* 2.0* 0.9 0.8  PROT 7.7 6.7 6.6 6.6  ALBUMIN 3.2* 2.8* 2.6* 2.8*   Recent Labs  Lab 08/02/20 2020  LIPASE 24   No results for input(s): AMMONIA in the last 168 hours. Coagulation Profile: Recent Labs  Lab 08/02/20 2020  INR 1.1   Cardiac Enzymes: No results for input(s): CKTOTAL, CKMB, CKMBINDEX, TROPONINI in the last 168 hours. BNP (last 3 results) No results for input(s): PROBNP in the last 8760 hours. HbA1C: No results for input(s): HGBA1C in the last 72 hours. CBG: No results for input(s): GLUCAP in the last 168 hours. Lipid Profile: No results for input(s): CHOL, HDL, LDLCALC, TRIG, CHOLHDL, LDLDIRECT in the last 72 hours. Thyroid Function Tests: No results for input(s): TSH, T4TOTAL, FREET4, T3FREE, THYROIDAB in the last 72 hours. Anemia Panel: No results for input(s): VITAMINB12, FOLATE, FERRITIN, TIBC, IRON, RETICCTPCT in the last 72 hours. Sepsis Labs: Recent Labs  Lab 08/02/20 1856 08/02/20 2020 08/04/20 0521  PROCALCITON  --   --  43.60  LATICACIDVEN 1.5 1.0  --     Recent Results (from the past 240 hour(s))  Blood Culture (routine x 2)     Status: Abnormal   Collection Time: 08/02/20  7:26 PM   Specimen: BLOOD  Result Value Ref Range Status   Specimen Description   Final    BLOOD BLOOD RIGHT ARM Performed at [facility] [address], [place], [place] [address]    Special Requests   Final  BOTTLES DRAWN AEROBIC AND ANAEROBIC Blood Culture results may not be optimal due to an excessive volume of blood received in culture bottles Performed at [facility], [place] [address] [place], [place] [address]    Culture   Setup Time   Final    GRAM NEGATIVE RODS IN BOTH AEROBIC AND ANAEROBIC BOTTLES CRITICAL VALUE NOTED.  VALUE IS CONSISTENT WITH PREVIOUSLY REPORTED AND CALLED VALUE.    Culture (A)  Final    ENTEROBACTER CLOACAE SUSCEPTIBILITIES PERFORMED ON PREVIOUS CULTURE WITHIN THE LAST 5 DAYS. Performed at [facility], [place] [address], [place], [place] [address]    Report Status 08/05/2020 FINAL  Final  Urine culture     Status: None   Collection Time: 08/02/20  7:26 PM   Specimen: In/Out Cath Urine  Result Value Ref Range Status   Specimen Description   Final    IN/OUT CATH URINE Performed at [facility] [address], [place], [place] [address]    Special Requests   Final    NONE Performed at [facility], [place] [address], [place], [place] [address]    Culture   Final    NO GROWTH Performed at [facility], [place] [address] [place], [place] [address]    Report Status 08/04/2020 FINAL  Final  Blood Culture (routine x 2)     Status: Abnormal   Collection Time: 08/02/20  7:31 PM   Specimen: BLOOD  Result Value Ref Range Status   Specimen Description   Final    BLOOD FEM LINE Performed at [facility] [address] [place], [place] [address]    Special Requests   Final    BOTTLES DRAWN AEROBIC AND ANAEROBIC Blood Culture adequate volume Performed at [facility] [address], [place], [place] [address]    Culture  Setup Time   Final    GRAM NEGATIVE RODS IN BOTH AEROBIC AND ANAEROBIC BOTTLES Organism ID to follow CRITICAL RESULT CALLED TO, READ BACK BY AND VERIFIED WITH: J. LEGGE PHARMD, AT 1300 08/03/20 BY D. VANHOOK Performed at [facility], [place] [address], [place], [place] [address]    Culture ENTEROBACTER CLOACAE (A)  Final   Report Status 08/05/2020 FINAL  Final   Organism ID, Bacteria ENTEROBACTER CLOACAE  Final      Susceptibility   Enterobacter cloacae - MIC*    CEFAZOLIN >=64 RESISTANT  Resistant     CEFEPIME <=0.12 SENSITIVE Sensitive     CEFTAZIDIME <=1 SENSITIVE Sensitive     CIPROFLOXACIN 0.5 SENSITIVE Sensitive     GENTAMICIN <=1 SENSITIVE Sensitive     IMIPENEM <=0.25 SENSITIVE Sensitive     TRIMETH/SULFA >=320 RESISTANT Resistant     PIP/TAZO <=4 SENSITIVE Sensitive     * ENTEROBACTER CLOACAE  Blood Culture ID Panel (Reflexed)     Status: Abnormal   Collection Time: 08/02/20  7:31 PM  Result Value Ref Range Status   Enterococcus faecalis NOT DETECTED NOT DETECTED Final   Enterococcus Faecium NOT DETECTED NOT DETECTED Final   Listeria monocytogenes NOT DETECTED NOT DETECTED Final   Staphylococcus species NOT DETECTED NOT DETECTED Final   Staphylococcus aureus (BCID) NOT DETECTED NOT DETECTED Final   Staphylococcus epidermidis NOT DETECTED NOT DETECTED Final   Staphylococcus lugdunensis NOT DETECTED NOT DETECTED Final   Streptococcus species NOT DETECTED NOT DETECTED Final   Streptococcus agalactiae NOT DETECTED NOT DETECTED Final   Streptococcus pneumoniae NOT DETECTED  NOT DETECTED Final   Streptococcus pyogenes NOT DETECTED NOT DETECTED Final   A.calcoaceticus-baumannii NOT DETECTED NOT DETECTED Final   Bacteroides fragilis NOT DETECTED NOT DETECTED Final   Enterobacterales DETECTED (A) NOT DETECTED Final    Comment: Enterobacterales represent a large order of gram negative bacteria, not a single organism. CRITICAL RESULT CALLED TO, READ BACK BY AND VERIFIED WITH: J. LEGGE PHARMD, AT 1300 08/03/20 BY D. VANHOOK    Enterobacter cloacae complex DETECTED (A) NOT DETECTED Final    Comment: CRITICAL RESULT CALLED TO, READ BACK BY AND VERIFIED WITH: J. LEGGE PHARMD, AT 1300 08/03/20 BY D. VANHOOK    Escherichia coli NOT DETECTED NOT DETECTED Final   Klebsiella aerogenes NOT DETECTED NOT DETECTED Final   Klebsiella oxytoca NOT DETECTED NOT DETECTED Final   Klebsiella pneumoniae NOT DETECTED NOT DETECTED Final   Proteus species NOT DETECTED NOT DETECTED Final    Salmonella species NOT DETECTED NOT DETECTED Final   Serratia marcescens NOT DETECTED NOT DETECTED Final   Haemophilus influenzae NOT DETECTED NOT DETECTED Final   Neisseria meningitidis NOT DETECTED NOT DETECTED Final   Pseudomonas aeruginosa NOT DETECTED NOT DETECTED Final   Stenotrophomonas maltophilia NOT DETECTED NOT DETECTED Final   Candida albicans NOT DETECTED NOT DETECTED Final   Candida auris NOT DETECTED NOT DETECTED Final   Candida glabrata NOT DETECTED NOT DETECTED Final   Candida krusei NOT DETECTED NOT DETECTED Final   Candida parapsilosis NOT DETECTED NOT DETECTED Final   Candida tropicalis NOT DETECTED NOT DETECTED Final   Cryptococcus neoformans/gattii NOT DETECTED NOT DETECTED Final   CTX-M ESBL NOT DETECTED NOT DETECTED Final   Carbapenem resistance IMP NOT DETECTED NOT DETECTED Final   Carbapenem resistance KPC NOT DETECTED NOT DETECTED Final   Carbapenem resistance NDM NOT DETECTED NOT DETECTED Final   Carbapenem resist OXA 48 LIKE NOT DETECTED NOT DETECTED Final   Carbapenem resistance VIM NOT DETECTED NOT DETECTED Final    Comment: Performed at [facility], [address] [place], [name] [address]  SARS Coronavirus 2 by RT PCR (hospital order, performed in [facility] hospital lab) Nasopharyngeal Nasopharyngeal Swab     Status: None   Collection Time: 08/02/20 10:40 PM   Specimen: Nasopharyngeal Swab  Result Value Ref Range Status   SARS Coronavirus 2 NEGATIVE NEGATIVE Final    Comment: (NOTE) SARS-CoV-2 target nucleic acids are NOT DETECTED.  The SARS-CoV-2 RNA is generally detectable in upper and lower respiratory specimens during the acute phase of infection. The lowest concentration of SARS-CoV-2 viral copies this assay can detect is 250 copies / mL. A negative result does not preclude SARS-CoV-2 infection and should not be used as the sole basis for treatment or other patient management decisions.  A negative result may occur with improper  specimen collection / handling, submission of specimen other than nasopharyngeal swab, presence of viral mutation(s) within the areas targeted by this assay, and inadequate number of viral copies (<250 copies / mL). A negative result must be combined with clinical observations, patient history, and epidemiological information.  Fact Sheet for Patients:   StrictlyIdeas.no  Fact Sheet for Healthcare Providers: BankingDealers.co.za  This test is not yet approved or  cleared by the [place] FDA and has been authorized for detection and/or diagnosis of SARS-CoV-2 by FDA under an Emergency Use Authorization (EUA).  This EUA will remain in effect (meaning this test can be used) for the duration of the COVID-19 declaration under Section 564(b)(1) of the Act, 21  U.S.C. section 360bbb-3(b)(1), unless the authorization is terminated or revoked sooner.  Performed at [facility], [place] [address] [place], [place] [address]   Culture, blood (routine x 2)     Status: None (Preliminary result)   Collection Time: 08/07/20  7:29 AM   Specimen: BLOOD  Result Value Ref Range Status   Specimen Description   Final    BLOOD BLOOD RIGHT HAND Performed at [facility] [address] [place], [place] [address]    Special Requests   Final    BOTTLES DRAWN AEROBIC ONLY Blood Culture adequate volume Performed at [facility] [address], [place], [place] [address]    Culture   Final    NO GROWTH < 12 HOURS Performed at [facility] [address], [place], [place] [address]    Report Status PENDING  Incomplete  Culture, blood (routine x 2)     Status: None (Preliminary result)   Collection Time: 08/07/20  7:29 AM   Specimen: BLOOD  Result Value Ref Range Status   Specimen Description   Final    BLOOD BLOOD RIGHT HAND Performed at [facility] [address] [place], [place] [address]    Special Requests   Final    BOTTLES DRAWN AEROBIC ONLY Blood Culture adequate volume Performed at [facility] [address], [place], [place] [address]    Culture   Final    NO GROWTH < 12 HOURS Performed at [facility] [address] [place],  [address]    Report Status PENDING  Incomplete         Radiology Studies: DG Abd 2 Views  Result Date: 08/07/2020 CLINICAL DATA:  Epigastric pain, history of Crohn's disease and small bowel disease EXAM: X-RAY ABDOMEN 2 VIEWS COMPARISON:  CT 08/02/2020 FINDINGS: There is diffuse gaseous distention of the colon, nonspecific. Surgical material is seen throughout the right hemiabdomen and to the left of midline compatible with patient's extensive surgical history. Additional clips noted in the region of the gallbladder fossa and along the surface of the liver are also compatible with the history of cholecystectomy. No suspicious calcifications. Few phleboliths in the pelvis. Right lower extremity PICC terminates at the inferior cavoatrial junction. Cardiomediastinal contours as included are unremarkable. Lung bases demonstrate some atelectatic changes but are otherwise clear. IMPRESSION: 1. Diffuse gaseous distention of the colon, nonspecific. Correlate with features of ileus. 2. Postsurgical changes throughout the abdomen compatible with patient's extensive surgical history. 3. Right lower extremity PICC terminates at the inferior cavoatrial junction. Electronically Signed   By: Lovena Le M.D.   On: 08/07/2020 15:25        Scheduled Meds: . amLODipine  10 mg Oral Daily  . budesonide  3 mg Oral Daily  . buPROPion  200 mg Oral Daily  . Chlorhexidine Gluconate Cloth  6 each Topical Daily  . cholecalciferol  1,000 Units Oral Daily  . cycloSPORINE  1 drop Both Eyes BID  . DULoxetine  90 mg Oral Daily  . estradiol  2 mg Oral Daily  . heparin  5,000 Units Subcutaneous Q8H  .  methadone  5 mg Oral 5 X Daily  . metoprolol tartrate  100 mg Oral BID  . omega-3 acid ethyl esters  1,000 mg Oral Daily  . pantoprazole  40 mg Oral Daily  . senna-docusate  1 tablet Oral BID  . sucralfate  1 g Oral TID WC &  HS  . vitamin B-12  1,000 mcg Oral Daily   Continuous Infusions: . ceFEPime (MAXIPIME) IV 2 g (08/08/20 0317)          Aline August, MD [facility] 08/08/2020, 10:11 AM

## 2020-08-08 NOTE — Progress Notes (Signed)
   08/08/20 1400  Assess: MEWS Score  Temp 98.4 F (36.9 C)  BP (!) 148/71  Pulse Rate (!) 111  Resp 15  SpO2 99 %  Assess: MEWS Score  MEWS Temp 0  MEWS Systolic 0  MEWS Pulse 2  MEWS RR 0  MEWS LOC 0  MEWS Score 2  MEWS Score Color Yellow  Assess: if the MEWS score is Yellow or Red  Were vital signs taken at a resting state? Yes  Focused Assessment No change from prior assessment  Early Detection of Sepsis Score *See Row Information* Low  MEWS guidelines implemented *See Row Information* No, previously yellow, continue vital signs every 4 hours  Treat  MEWS Interventions Other (Comment) (None previous yellow)  Document  Patient Outcome Other (Comment) (Pt stable, previous yellow)  Progress note created (see row info) Yes  Pt stable, previous yellow

## 2020-08-08 NOTE — Progress Notes (Signed)
INFECTIOUS DISEASE PROGRESS NOTE  ID: Caitlin Peters is a 59 y.o. female with  Principal Problem:   Sepsis ([place]) Active Problems:   Anemia   AKI (acute kidney injury) ([place])   Crohn disease ([place])   Chronic pain syndrome   Central line infection   Enterobacter sepsis ([place])   Bacteremia  Subjective: C/o GI upset.   Abtx:  Anti-infectives (From admission, onward)   Start     Dose/Rate Route Frequency Ordered Stop   08/05/20 1500  ceFEPIme (MAXIPIME) 2 g in sodium chloride 0.9 % 100 mL IVPB        2 g 200 mL/hr over 30 Minutes Intravenous Every 12 hours 08/05/20 1346     08/04/20 2200  vancomycin (VANCOCIN) IVPB 1000 mg/200 mL premix  Status:  Discontinued        1,000 mg 200 mL/hr over 60 Minutes Intravenous Every 48 hours 08/03/20 0341 08/03/20 1514   08/03/20 1400  ceFEPIme (MAXIPIME) 2 g in sodium chloride 0.9 % 100 mL IVPB  Status:  Discontinued        2 g 200 mL/hr over 30 Minutes Intravenous Every 24 hours 08/03/20 1343 08/05/20 1346   08/03/20 1200  cefTAZidime (FORTAZ) 2 g in sodium chloride 0.9 % 100 mL IVPB  Status:  Discontinued        2 g 200 mL/hr over 30 Minutes Intravenous Every 12 hours 08/03/20 1044 08/03/20 1330   08/03/20 1000  piperacillin-tazobactam (ZOSYN) IVPB 3.375 g  Status:  Discontinued        3.375 g 12.5 mL/hr over 240 Minutes Intravenous Every 8 hours 08/03/20 0341 08/03/20 1044   08/03/20 0400  piperacillin-tazobactam (ZOSYN) IVPB 3.375 g        3.375 g 100 mL/hr over 30 Minutes Intravenous  Once 08/03/20 0352 08/03/20 0408   08/03/20 0345  piperacillin-tazobactam (ZOSYN) IVPB 3.375 g  Status:  Discontinued        3.375 g 12.5 mL/hr over 240 Minutes Intravenous  Once 08/03/20 0341 08/03/20 0351   08/03/20 0015  vancomycin (VANCOREADY) IVPB 1250 mg/250 mL        1,250 mg 166.7 mL/hr over 90 Minutes Intravenous  Once 08/03/20 0002 08/03/20 0257   08/02/20 1930  metroNIDAZOLE (FLAGYL) IVPB 500 mg        500 mg 100 mL/hr over 60 Minutes  Intravenous  Once 08/02/20 1928 08/02/20 2311   08/02/20 1930  cefTRIAXone (ROCEPHIN) 1 g in sodium chloride 0.9 % 100 mL IVPB        1 g 200 mL/hr over 30 Minutes Intravenous  Once 08/02/20 1928 08/02/20 2200      Medications:  Scheduled: . amLODipine  10 mg Oral Daily  . budesonide  3 mg Oral Daily  . buPROPion  200 mg Oral Daily  . Chlorhexidine Gluconate Cloth  6 each Topical Daily  . cholecalciferol  1,000 Units Oral Daily  . cycloSPORINE  1 drop Both Eyes BID  . DULoxetine  90 mg Oral Daily  . estradiol  2 mg Oral Daily  . heparin  5,000 Units Subcutaneous Q8H  . methadone  5 mg Oral 5 X Daily  . metoprolol tartrate  100 mg Oral BID  . omega-3 acid ethyl esters  1,000 mg Oral Daily  . pantoprazole  40 mg Oral Daily  . senna-docusate  1 tablet Oral BID  . sucralfate  1 g Oral TID WC & HS  . vitamin B-12  1,000 mcg Oral Daily  Objective: Vital signs in last 24 hours: Temp:  [98.2 F (36.8 C)-98.4 F (36.9 C)] 98.4 F (36.9 C) (09/13 1400) Pulse Rate:  [105-111] 111 (09/13 1400) Resp:  [15-20] 15 (09/13 1400) BP: (146-178)/(71-88) 148/71 (09/13 1400) SpO2:  [99 %-100 %] 99 % (09/13 1400) Weight:  [66 kg] 66 kg (09/13 0743)   General appearance: alert and no distress Resp: clear to auscultation bilaterally Cardio: regular rate and rhythm GI: normal findings: bowel sounds normal and soft, non-tender Extremities: R groin catheter  Lab Results Recent Labs    08/06/20 0551 08/06/20 0551 08/07/20 0502 08/08/20 0525  WBC 3.5*  --   --  4.2  HGB 7.8*  --   --  8.6*  HCT 24.0*  --   --  26.4*  NA 139   < > 137 136  K 3.4*   < > 3.4* 3.6  CL 101   < > 103 104  CO2 28   < > 26 25  BUN 14   < > 12 15  CREATININE 1.23*   < > 1.24* 1.36*   < > = values in this interval not displayed.   Liver Panel No results for input(s): PROT, ALBUMIN, AST, ALT, ALKPHOS, BILITOT, BILIDIR, IBILI in the last 72 hours. Sedimentation Rate No results for input(s): ESRSEDRATE in  the last 72 hours. C-Reactive Protein No results for input(s): CRP in the last 72 hours.  Microbiology: Recent Results (from the past 240 hour(s))  Blood Culture (routine x 2)     Status: Abnormal   Collection Time: 08/02/20  7:26 PM   Specimen: BLOOD  Result Value Ref Range Status   Specimen Description   Final    BLOOD BLOOD RIGHT ARM Performed at [facility] [address], [place], [place] [address]    Special Requests   Final    BOTTLES DRAWN AEROBIC AND ANAEROBIC Blood Culture results may not be optimal due to an excessive volume of blood received in culture bottles Performed at [facility] [address] [place], [place] [address]    Culture  Setup Time   Final    GRAM NEGATIVE RODS IN BOTH AEROBIC AND ANAEROBIC BOTTLES CRITICAL VALUE NOTED.  VALUE IS CONSISTENT WITH PREVIOUSLY REPORTED AND CALLED VALUE.    Culture (A)  Final    ENTEROBACTER CLOACAE SUSCEPTIBILITIES PERFORMED ON PREVIOUS CULTURE WITHIN THE LAST 5 DAYS. Performed at [facility], [place] [address] [place], [place] [address]    Report Status 08/05/2020 FINAL  Final  Urine culture     Status: None   Collection Time: 08/02/20  7:26 PM   Specimen: In/Out Cath Urine  Result Value Ref Range Status   Specimen Description   Final    IN/OUT CATH URINE Performed at [facility] [address], [place], [place] [address]    Special Requests   Final    NONE Performed at [facility], [place] [address], [place], [place] [address]    Culture   Final    NO GROWTH Performed at [facility], [place] [address] [place], [place] [address]    Report Status 08/04/2020 FINAL  Final  Blood Culture (routine x 2)     Status: Abnormal   Collection Time: 08/02/20  7:31 PM   Specimen: BLOOD  Result Value Ref Range Status   Specimen Description   Final    BLOOD FEM LINE Performed at [facility],  [address] [place],  [place] [address]    Special Requests   Final    BOTTLES DRAWN AEROBIC AND ANAEROBIC Blood Culture adequate volume Performed at [facility] [address], [place], [place] [address]    Culture  Setup Time   Final    GRAM NEGATIVE RODS IN BOTH AEROBIC AND ANAEROBIC BOTTLES Organism ID to follow CRITICAL RESULT CALLED TO, READ BACK BY AND VERIFIED WITH: J. LEGGE PHARMD, AT 1300 08/03/20 BY D. VANHOOK Performed at [facility], [place] [address], [place], [place] [address]    Culture ENTEROBACTER CLOACAE (A)  Final   Report Status 08/05/2020 FINAL  Final   Organism ID, Bacteria ENTEROBACTER CLOACAE  Final      Susceptibility   Enterobacter cloacae - MIC*    CEFAZOLIN >=64 RESISTANT Resistant     CEFEPIME <=0.12 SENSITIVE Sensitive     CEFTAZIDIME <=1 SENSITIVE Sensitive     CIPROFLOXACIN 0.5 SENSITIVE Sensitive     GENTAMICIN <=1 SENSITIVE Sensitive     IMIPENEM <=0.25 SENSITIVE Sensitive     TRIMETH/SULFA >=320 RESISTANT Resistant     PIP/TAZO <=4 SENSITIVE Sensitive     * ENTEROBACTER CLOACAE  Blood Culture ID Panel (Reflexed)     Status: Abnormal   Collection Time: 08/02/20  7:31 PM  Result Value Ref Range Status   Enterococcus faecalis NOT DETECTED NOT DETECTED Final   Enterococcus Faecium NOT DETECTED NOT DETECTED Final   Listeria monocytogenes NOT DETECTED NOT DETECTED Final   Staphylococcus species NOT DETECTED NOT DETECTED Final   Staphylococcus aureus (BCID) NOT DETECTED NOT DETECTED Final   Staphylococcus epidermidis NOT DETECTED NOT DETECTED Final   Staphylococcus lugdunensis NOT DETECTED NOT DETECTED Final   Streptococcus species NOT DETECTED NOT DETECTED Final   Streptococcus agalactiae NOT DETECTED NOT DETECTED Final   Streptococcus pneumoniae NOT DETECTED NOT DETECTED Final   Streptococcus pyogenes NOT DETECTED NOT DETECTED Final   A.calcoaceticus-baumannii NOT DETECTED NOT DETECTED Final   Bacteroides fragilis NOT DETECTED NOT DETECTED Final    Enterobacterales DETECTED (A) NOT DETECTED Final    Comment: Enterobacterales represent a large order of gram negative bacteria, not a single organism. CRITICAL RESULT CALLED TO, READ BACK BY AND VERIFIED WITH: J. LEGGE PHARMD, AT 1300 08/03/20 BY D. VANHOOK    Enterobacter cloacae complex DETECTED (A) NOT DETECTED Final    Comment: CRITICAL RESULT CALLED TO, READ BACK BY AND VERIFIED WITH: J. LEGGE PHARMD, AT 1300 08/03/20 BY D. VANHOOK    Escherichia coli NOT DETECTED NOT DETECTED Final   Klebsiella aerogenes NOT DETECTED NOT DETECTED Final   Klebsiella oxytoca NOT DETECTED NOT DETECTED Final   Klebsiella pneumoniae NOT DETECTED NOT DETECTED Final   Proteus species NOT DETECTED NOT DETECTED Final   Salmonella species NOT DETECTED NOT DETECTED Final   Serratia marcescens NOT DETECTED NOT DETECTED Final   Haemophilus influenzae NOT DETECTED NOT DETECTED Final   Neisseria meningitidis NOT DETECTED NOT DETECTED Final   Pseudomonas aeruginosa NOT DETECTED NOT DETECTED Final   Stenotrophomonas maltophilia NOT DETECTED NOT DETECTED Final   Candida albicans NOT DETECTED NOT DETECTED Final   Candida auris NOT DETECTED NOT DETECTED Final   Candida glabrata NOT DETECTED NOT DETECTED Final   Candida krusei NOT DETECTED NOT DETECTED Final   Candida parapsilosis NOT DETECTED NOT DETECTED Final   Candida tropicalis NOT DETECTED NOT DETECTED Final   Cryptococcus neoformans/gattii NOT DETECTED NOT DETECTED Final   CTX-M ESBL NOT DETECTED NOT DETECTED  Final   Carbapenem resistance IMP NOT DETECTED NOT DETECTED Final   Carbapenem resistance KPC NOT DETECTED NOT DETECTED Final   Carbapenem resistance NDM NOT DETECTED NOT DETECTED Final   Carbapenem resist OXA 48 LIKE NOT DETECTED NOT DETECTED Final   Carbapenem resistance VIM NOT DETECTED NOT DETECTED Final    Comment: Performed at [facility], [place] [address], [place], [place] [address]  SARS Coronavirus 2 by RT PCR (hospital order, performed in  [facility] hospital lab) Nasopharyngeal Nasopharyngeal Swab     Status: None   Collection Time: 08/02/20 10:40 PM   Specimen: Nasopharyngeal Swab  Result Value Ref Range Status   SARS Coronavirus 2 NEGATIVE NEGATIVE Final    Comment: (NOTE) SARS-CoV-2 target nucleic acids are NOT DETECTED.  The SARS-CoV-2 RNA is generally detectable in upper and lower respiratory specimens during the acute phase of infection. The lowest concentration of SARS-CoV-2 viral copies this assay can detect is 250 copies / mL. A negative result does not preclude SARS-CoV-2 infection and should not be used as the sole basis for treatment or other patient management decisions.  A negative result may occur with improper specimen collection / handling, submission of specimen other than nasopharyngeal swab, presence of viral mutation(s) within the areas targeted by this assay, and inadequate number of viral copies (<250 copies / mL). A negative result must be combined with clinical observations, patient history, and epidemiological information.  Fact Sheet for Patients:   StrictlyIdeas.no  Fact Sheet for Healthcare Providers: BankingDealers.co.za  This test is not yet approved or  cleared by the [place] FDA and has been authorized for detection and/or diagnosis of SARS-CoV-2 by FDA under an Emergency Use Authorization (EUA).  This EUA will remain in effect (meaning this test can be used) for the duration of the COVID-19 declaration under Section 564(b)(1) of the Act, 21 U.S.C. section 360bbb-3(b)(1), unless the authorization is terminated or revoked sooner.  Performed at [facility], [place] [address], [place], [place] [address]   Culture, blood (routine x 2)     Status: None (Preliminary result)   Collection Time: 08/07/20  7:29 AM   Specimen: BLOOD  Result Value Ref Range Status   Specimen Description   Final    BLOOD BLOOD RIGHT  HAND Performed at [facility] [address], [place], [place] [address]    Special Requests   Final    BOTTLES DRAWN AEROBIC ONLY Blood Culture adequate volume Performed at [facility] [address] [place], [place] [address]    Culture   Final    NO GROWTH < 12 HOURS Performed at [facility] [address], [place], [place] [address]    Report Status PENDING  Incomplete  Culture, blood (routine x 2)     Status: None (Preliminary result)   Collection Time: 08/07/20  7:29 AM   Specimen: BLOOD  Result Value Ref Range Status   Specimen Description   Final    BLOOD BLOOD RIGHT HAND Performed at [facility] [address], [place], [place] [address]    Special Requests   Final    BOTTLES DRAWN AEROBIC ONLY Blood Culture adequate volume Performed at [facility] [address] [place], [place] [address]    Culture   Final    NO GROWTH < 12 HOURS Performed at [facility] [address], [place],  [address]    Report Status PENDING  Incomplete    Studies/Results: DG Abd 2 Views  Result Date: 08/08/2020 CLINICAL DATA:  Follow-up ileus.  Central abdominal discomfort. EXAM: X-RAY ABDOMEN 2 VIEWS COMPARISON:  08/07/2020 FINDINGS: Right lower extremity central venous catheter is noted with tip at the inferior cavoatrial junction. Pneumobilia is again identified. No significant interval change in the appearance of gaseous distension of the colon. IMPRESSION: 1. No significant interval change in gaseous distension of the colon compatible with colonic ileus. 2. Pneumobilia. Electronically Signed   By: Kerby Moors M.D.   On: 08/08/2020 11:01   DG Abd 2 Views  Result Date: 08/07/2020 CLINICAL DATA:  Epigastric pain, history of Crohn's disease and small bowel disease EXAM: X-RAY ABDOMEN 2 VIEWS COMPARISON:  CT 08/02/2020 FINDINGS: There is diffuse gaseous distention of the colon,  nonspecific. Surgical material is seen throughout the right hemiabdomen and to the left of midline compatible with patient's extensive surgical history. Additional clips noted in the region of the gallbladder fossa and along the surface of the liver are also compatible with the history of cholecystectomy. No suspicious calcifications. Few phleboliths in the pelvis. Right lower extremity PICC terminates at the inferior cavoatrial junction. Cardiomediastinal contours as included are unremarkable. Lung bases demonstrate some atelectatic changes but are otherwise clear. IMPRESSION: 1. Diffuse gaseous distention of the colon, nonspecific. Correlate with features of ileus. 2. Postsurgical changes throughout the abdomen compatible with patient's extensive surgical history. 3. Right lower extremity PICC terminates at the inferior cavoatrial junction. Electronically Signed   By: Lovena Le M.D.   On: 08/07/2020 15:25     Assessment/Plan: Crohn's Disease Enterobacter Prev Stenotrophomonas bacteremia  Total days of antibiotics: 6 ceftriaxone --> cefepime  She is planning to be d/c tomorrow That would give her 7 days of IV anbx for her line infection. Her repeat BCx have cleared.  Can stop her anbx at d/c.  Available as needed.          Bobby Rumpf MD, FACP Infectious Diseases (pager) [phone] [email or web address] 08/08/2020, 3:59 PM  LOS: 6 days

## 2020-08-08 NOTE — Care Management Important Message (Signed)
Important Message  Patient Details IM Letter given to the Patient Name: Caitlin Peters MRN: [id number] Date of Birth: January 04, 1961   Medicare Important Message Given:  Yes     [name] 08/08/2020, 10:09 AM

## 2020-08-09 ENCOUNTER — Inpatient Hospital Stay (HOSPITAL_COMMUNITY): Payer: Medicare Other

## 2020-08-09 ENCOUNTER — Telehealth: Payer: Self-pay | Admitting: Internal Medicine

## 2020-08-09 ENCOUNTER — Encounter (HOSPITAL_COMMUNITY): Payer: Self-pay | Admitting: Internal Medicine

## 2020-08-09 DIAGNOSIS — R410 Disorientation, unspecified: Secondary | ICD-10-CM

## 2020-08-09 LAB — CBC WITH DIFFERENTIAL/PLATELET
Abs Immature Granulocytes: 0.03 10*3/uL (ref 0.00–0.07)
Basophils Absolute: 0 10*3/uL (ref 0.0–0.1)
Basophils Relative: 0 %
Eosinophils Absolute: 0.1 10*3/uL (ref 0.0–0.5)
Eosinophils Relative: 2 %
HCT: 25.7 % — ABNORMAL LOW (ref 36.0–46.0)
Hemoglobin: 8.4 g/dL — ABNORMAL LOW (ref 12.0–15.0)
Immature Granulocytes: 1 %
Lymphocytes Relative: 44 %
Lymphs Abs: 2.5 10*3/uL (ref 0.7–4.0)
MCH: 30.3 pg (ref 26.0–34.0)
MCHC: 32.7 g/dL (ref 30.0–36.0)
MCV: 92.8 fL (ref 80.0–100.0)
Monocytes Absolute: 0.3 10*3/uL (ref 0.1–1.0)
Monocytes Relative: 5 %
Neutro Abs: 2.7 10*3/uL (ref 1.7–7.7)
Neutrophils Relative %: 48 %
Platelets: 193 10*3/uL (ref 150–400)
RBC: 2.77 MIL/uL — ABNORMAL LOW (ref 3.87–5.11)
RDW: 14.1 % (ref 11.5–15.5)
WBC: 5.6 10*3/uL (ref 4.0–10.5)
nRBC: 0 % (ref 0.0–0.2)

## 2020-08-09 LAB — URINALYSIS, ROUTINE W REFLEX MICROSCOPIC
Bacteria, UA: NONE SEEN
Bilirubin Urine: NEGATIVE
Glucose, UA: NEGATIVE mg/dL
Ketones, ur: NEGATIVE mg/dL
Leukocytes,Ua: NEGATIVE
Nitrite: NEGATIVE
Protein, ur: NEGATIVE mg/dL
Specific Gravity, Urine: 1.005 (ref 1.005–1.030)
pH: 6 (ref 5.0–8.0)

## 2020-08-09 LAB — GLUCOSE, CAPILLARY
Glucose-Capillary: 96 mg/dL (ref 70–99)
Glucose-Capillary: 98 mg/dL (ref 70–99)
Glucose-Capillary: 114 mg/dL — ABNORMAL HIGH (ref 70–99)

## 2020-08-09 LAB — BASIC METABOLIC PANEL
Anion gap: 11 (ref 5–15)
BUN: 16 mg/dL (ref 6–20)
CO2: 22 mmol/L (ref 22–32)
Calcium: 9.9 mg/dL (ref 8.9–10.3)
Chloride: 103 mmol/L (ref 98–111)
Creatinine, Ser: 1.68 mg/dL — ABNORMAL HIGH (ref 0.44–1.00)
GFR calc Af Amer: 38 mL/min — ABNORMAL LOW (ref >60)
GFR calc non Af Amer: 33 mL/min — ABNORMAL LOW (ref >60)
Glucose, Bld: 102 mg/dL — ABNORMAL HIGH (ref 70–99)
Potassium: 3.4 mmol/L — ABNORMAL LOW (ref 3.5–5.1)
Sodium: 136 mmol/L (ref 135–145)

## 2020-08-09 LAB — MAGNESIUM: Magnesium: 1.8 mg/dL (ref 1.7–2.4)

## 2020-08-09 LAB — PHOSPHORUS: Phosphorus: 3.2 mg/dL (ref 2.5–4.6)

## 2020-08-09 MED ORDER — INSULIN ASPART 100 UNIT/ML ~~LOC~~ SOLN
0.0000 [IU] | SUBCUTANEOUS | Status: DC
  Administered 2020-08-10 (×3): 1 [IU] via SUBCUTANEOUS

## 2020-08-09 MED ORDER — HYDRALAZINE HCL 25 MG PO TABS
25.0000 mg | ORAL_TABLET | Freq: Three times a day (TID) | ORAL | Status: DC | PRN
  Administered 2020-08-09: 25 mg via ORAL
  Filled 2020-08-09: qty 1

## 2020-08-09 MED ORDER — POTASSIUM CHLORIDE 10 MEQ/100ML IV SOLN
10.0000 meq | INTRAVENOUS | Status: AC
  Administered 2020-08-09 (×3): 10 meq via INTRAVENOUS
  Filled 2020-08-09 (×3): qty 100

## 2020-08-09 MED ORDER — LORAZEPAM 2 MG/ML IJ SOLN
0.5000 mg | Freq: Once | INTRAMUSCULAR | Status: AC | PRN
  Administered 2020-08-10: 0.5 mg via INTRAVENOUS
  Filled 2020-08-09: qty 1

## 2020-08-09 MED ORDER — MAGNESIUM SULFATE 50 % IJ SOLN: 1.0000 g | Freq: Once | INTRAMUSCULAR | Status: DC

## 2020-08-09 MED ORDER — IOHEXOL 9 MG/ML PO SOLN
ORAL | Status: AC
  Filled 2020-08-09: qty 1000

## 2020-08-09 MED ORDER — KCL IN DEXTROSE-NACL 40-5-0.9 MEQ/L-%-% IV SOLN
INTRAVENOUS | Status: AC
  Filled 2020-08-09: qty 1000

## 2020-08-09 MED ORDER — IOHEXOL 9 MG/ML PO SOLN
500.0000 mL | ORAL | Status: AC
  Administered 2020-08-09 (×2): 500 mL via ORAL

## 2020-08-09 MED ORDER — KCL IN DEXTROSE-NACL 40-5-0.9 MEQ/L-%-% IV SOLN
INTRAVENOUS | Status: DC
  Filled 2020-08-09 (×3): qty 1000

## 2020-08-09 MED ORDER — TRAVASOL 10 % IV SOLN
INTRAVENOUS | Status: AC
  Filled 2020-08-09: qty 499.2

## 2020-08-09 MED ORDER — METOPROLOL TARTRATE 5 MG/5ML IV SOLN
2.5000 mg | Freq: Four times a day (QID) | INTRAVENOUS | Status: DC | PRN
  Administered 2020-08-09: 2.5 mg via INTRAVENOUS
  Filled 2020-08-09: qty 5

## 2020-08-09 MED ORDER — MAGNESIUM SULFATE IN D5W 1-5 GM/100ML-% IV SOLN
1.0000 g | Freq: Once | INTRAVENOUS | Status: AC
  Administered 2020-08-09: 1 g via INTRAVENOUS
  Filled 2020-08-09: qty 100

## 2020-08-09 MED ORDER — IOHEXOL 300 MG/ML  SOLN
100.0000 mL | Freq: Once | INTRAMUSCULAR | Status: AC | PRN
  Administered 2020-08-09: 80 mL via INTRAVENOUS

## 2020-08-09 NOTE — Progress Notes (Signed)
Spoke with Rn will need to call family to clear patient before Mri.

## 2020-08-09 NOTE — Progress Notes (Signed)
PHARMACY - TOTAL PARENTERAL NUTRITION CONSULT NOTE   Indication: Short bowel syndrome  Patient Measurements: Height: 5' 8"  (172.7 cm) Weight: 66 kg (145 lb 8.1 oz) IBW/kg (Calculated) : 63.9 TPN AdjBW (KG): 66 Body mass index is 22.12 kg/m.  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 TPN has been on hold since admission. Left femoral access was removed 9/10 and R femoral access placed. With repeat blood cultures negative so far, plan is to resume TPN. Cyclic TPN is managed by [facility] in the outpatient setting. Formula is in the media section of the chart. Patient is able to eat some food but absorption is unreliable with short gut syndrome per RD.   Glucose / Insulin: WNL Electrolytes: K slightly low at 3.4, other WNL Renal: AKI on CKD with fluctuating SCr LFTs / TGs: mild elevation (9/10)/ TG pendign  Prealbumin / albumin: pending/ 2.8(9/10) Intake / Output; MIVF: some oral intake, D5NSw/40 meq KCl @ 75 GI Imaging: 9/7 CT abdomen/pelvis neg Surgeries / Procedures: See central access  Central access: 9/10 IR removal of L femoral CVC and placement of R femoral CVC TPN start date: 9/14  Nutritional Goals (per RD recommendation on 9/10): kCal: 2000-2200, Protein: 100-110 g, Fluid: 2L/day Goal TPN rate is 80 mL/hr (provides 100 g of protein and 2185 kcals per day)  Current Nutrition:  Some oral intake, MIVF  Plan:  Now: Mg sulfate 1 g iv once and 3 runs of KCl 10 meq   Start TPN at 40 mL/hr at 1800 providing 1092 kcal and 50 g protein Electrolytes in TPN: 93mq/L of Na, 569m/L of K, 37m39mL of Ca, 37mE32m of Mg, and 137mm36m of Phos. Cl:Ac ratio 1:1 Add standard MVI and trace elements to TPN Initiate Sensitive q4h SSI and adjust as needed  Reduce MIVF to 35 mL/hr at 1800 Monitor TPN labs on Mon/Thurs Initial TPN labs in AM  ChrisSt. LeottNicki Reaper14/2021,10:41 AM

## 2020-08-09 NOTE — Significant Event (Signed)
Rapid Response Event Note   Reason for Call : Altered Mental Status Notified by bedside RN that patient was only alert to self, repeating phrases, and had been previously alert, oriented x4 on the previous shifts. CBG was 96. Patient was alert, but oriented x 1 to 2.    Initial Focused Assessment:  Neuro: Alert and oriented x1 to 2, able to tell me her name, and she was in the hospital. Unable to tell me what the name of the hospital was, city or state, or actual month and date. She can verbalize her actual birth date but not the birth month.  Cardiac: patient HTN, HR slightly tachycardic, see vital signs, s1 and s2 heard upon auscultation Pulmonary: Not in respiratory distress, lung sounds clear and diminished upon auscultation, O2 Sats WNL, on RA. Abdomen: Patient complaining of low abdominal pain, abdomen appears soft upon palpation but tender according to patient.   Interventions:  MD Alekh notified,  -give scheduled BP medications early -hold IV diluadid for now due to AMS CT scan of head and abdomen STAT IV Metoprolol 2.64m ordered CBG 96  Changed Central line Dressing  EKG in process  Plan of Care:  Continue to carry out interventions ordered by MD, if HTN does not resolve after medications give please notify MD. If patient has worsening mental status please call Rapid Response or any other significant clinical status change (([phone]  Event Summary:   MD Notified:Starla Link Call Time: 0[place]ontoTime: [id number] Time:  SLaurence Slate RN

## 2020-08-09 NOTE — Progress Notes (Signed)
Nutrition Follow-up  INTERVENTION:   -TPN management per Pharmacy  NUTRITION DIAGNOSIS:   Increased nutrient needs related to altered GI function, chronic illness (short gut syndrome) as evidenced by estimated needs.  Ongoing.  GOAL:   Patient will meet greater than or equal to 90% of their needs  Not currently.  MONITOR:   PO intake, Labs, Weight trends, I & O's (TPN)  REASON FOR ASSESSMENT:   Consult New TPN/TNA  ASSESSMENT:   59 y.o. female with medical history significant of chronic diarrhea, hypertension, CKD stage III, chronic anemia, Crohn's disease, short gut syndrome/TPN dependent, chronic pain on methadone, malnutrition, anxiety, depression, tobacco use, recent hospital admission for Enterobacter and stenotrophomonas sepsis in the setting of long-term PICC line in the left thigh which was treated with IV antibiotics and subsequently oral Bactrim on discharge till 08/02/2020 presented on 08/02/2020 with fever.  Patient with AMS, s/p rapid response this morning.   Per MD note, abdominal x-ray from 9/13 shows possible ileus. Pt has been having pain.  TPN to resume today. Pt's PO intake is unable to meet pt's nutritional needs given short gut syndrome.   Weight on 9/13: 145 lbs.  Medications: Vitamin D, Omega 3, Senokot, Carafate, Vitamin B-12, D5 infusion,  IV Mg sulfate, IV KCl  Labs reviewed: Low K Mg/Phos WNL  Diet Order:   Diet Order            Diet Heart Room service appropriate? Yes; Fluid consistency: Thin  Diet effective now                 EDUCATION NEEDS:   No education needs have been identified at this time  Skin:  Skin Assessment: Reviewed RN Assessment  Last BM:  9/13  Height:   Ht Readings from Last 1 Encounters:  08/08/20 5' 8"  (1.727 m)    Weight:   Wt Readings from Last 1 Encounters:  08/08/20 66 kg   BMI:  Body mass index is 22.12 kg/m.  Estimated Nutritional Needs:   Kcal:  2000-2200  Protein:  100-115g  Fluid:   2L/day  Clayton Bibles, MS, RD, LDN Inpatient Clinical Dietitian Contact information available via Amion

## 2020-08-09 NOTE — Progress Notes (Signed)
Patient ID: JOSAPHINE SHIMAMOTO, female   DOB: 1961/01/28, 59 y.o.   MRN: [id number]  PROGRESS NOTE    KARMA ANSLEY  IDP:[id number] DOB: [birth year] DOA: 08/02/2020 PCP: [name], [name], MD   Brief Narrative:  59 y.o. female with medical history significant of chronic diarrhea, hypertension, CKD stage III, chronic anemia, Crohn's disease, short gut syndrome/TPN dependent, chronic pain on methadone, malnutrition, anxiety, depression, tobacco use, recent hospital admission for Enterobacter and stenotrophomonas sepsis in the setting of long-term PICC line in the left thigh which was treated with IV antibiotics and subsequently oral Bactrim on discharge till 08/02/2020 presented on 08/02/2020 with fever.  On presentation, she was febrile with temperature of 100.8 along with tachycardia with hypotension along with leukocytosis of 18.5 creatinine of 2.5.  COVID-19 testing was negative.  Chest x-ray was negative for infiltrates.  CT of the abdomen and pelvis did not show any obvious infectious source.  She was started on broad-spectrum antibiotics and IV fluids.  She was found to have Enterobacter bacteremia.  ID was consulted.  Subsequently IR was also consulted.  Assessment & Plan:   Sepsis: Present on admission Enterobacter bacteremia -Patient was recently treated for stenotrophomonas and Enterobacter bacteremia, most likely related to her chronic femoral PICC line.  She received IV antibiotics and was discharged on oral Bactrim till 08/02/2020 but subsequently presented with fever the same day. -Currently on IV Cipro p.o. for Enterobacter bacteremia. - Status post removal of existing left common femoral tunneled central venous catheter and placement of right common femoral vein tunneled central Venous catheter on 08/05/2020 by IR. -Today's day #7 of IV antibiotics.  No need for any more antibiotics on discharge as per ID.  ID signed off on 08/08/2020. -Repeat blood cultures from 08/07/2020 are negative  so far.  Will resume TPN today. -Currently afebrile   Confusion -Questionable cause.  Patient is getting IV Dilaudid for pain control and is confused this morning and oriented to self only.  Unclear if this is related to Dilaudid versus any other cause including elevated blood pressure.  Stat CT of the head.  No focal neurologic deficit or weaknesses.  Doubt that patient is having a stroke.  If CT head is unremarkable and patient still is confused, then might have to proceed with MRI of the brain. -will start gentle hydration.  Check UA.  Patient is not coughing and afebrile has now that patient has pneumonia  AKI on chronic kidney disease stage IIIb Anion gap metabolic acidosis -Probably from prerenal azotemia from dehydration/sepsis.  Presented with creatinine of 2.5.  Baseline creatinine 1.6-1.7.  CT without evidence of obstructive uropathy.  Creatinine 1.68 today.  Acidosis is resolved.  Off bicarb drip. -will start gentle hydration  Hypokalemia -Replace.  Repeat a.m. labs  Hypomagnesemia -Improved  Anemia of chronic disease -Probably from chronic kidney disease.  Hemoglobin 8.4 today.  No signs of bleeding.  Transfuse if hemoglobin is less than 7  Thrombocytopenia -Questionable cause.  Resolved  Crohn's disease/short gut syndrome -Continue budesonide.   Resume TPN today. -Complains of increasing abdominal pain requiring IV Dilaudid instead of regular oral Dilaudid as needed.  X-ray of abdomen from 08/08/2020 showed possible colonic ileus.  Will get CT of the abdomen with contrast.  Chronic pain syndrome -Continue scheduled home methadone.  Hold IV Dilaudid for now.   Hypertension -Continue amlodipine and metoprolol.  Blood pressure elevated this morning.  Use IV metoprolol as needed.  Monitor.  DVT prophylaxis: Subcutaneous heparin Code Status:  Full Family Communication: None at bedside Disposition Plan: Status is: Inpatient  Remains inpatient appropriate  because:Inpatient level of care appropriate due to severity of illness.  Still requiring IV antibiotics.  DC home in 1 to 2 days once mental status improves  Dispo: The patient is from: Home              Anticipated d/c is to: Home              Anticipated d/c date is: 1 day              Patient currently is not medically stable to d/c.   Consultants: ID. IR  Procedures:  removal of existing left common femoral tunneled central venous catheter and placement of right common femoral vein tunneled central Venous catheter on 08/05/2020 by IR.   Antimicrobials:  Anti-infectives (From admission, onward)   Start     Dose/Rate Route Frequency Ordered Stop   08/05/20 1500  ceFEPIme (MAXIPIME) 2 g in sodium chloride 0.9 % 100 mL IVPB        2 g 200 mL/hr over 30 Minutes Intravenous Every 12 hours 08/05/20 1346     08/04/20 2200  vancomycin (VANCOCIN) IVPB 1000 mg/200 mL premix  Status:  Discontinued        1,000 mg 200 mL/hr over 60 Minutes Intravenous Every 48 hours 08/03/20 0341 08/03/20 1514   08/03/20 1400  ceFEPIme (MAXIPIME) 2 g in sodium chloride 0.9 % 100 mL IVPB  Status:  Discontinued        2 g 200 mL/hr over 30 Minutes Intravenous Every 24 hours 08/03/20 1343 08/05/20 1346   08/03/20 1200  cefTAZidime (FORTAZ) 2 g in sodium chloride 0.9 % 100 mL IVPB  Status:  Discontinued        2 g 200 mL/hr over 30 Minutes Intravenous Every 12 hours 08/03/20 1044 08/03/20 1330   08/03/20 1000  piperacillin-tazobactam (ZOSYN) IVPB 3.375 g  Status:  Discontinued        3.375 g 12.5 mL/hr over 240 Minutes Intravenous Every 8 hours 08/03/20 0341 08/03/20 1044   08/03/20 0400  piperacillin-tazobactam (ZOSYN) IVPB 3.375 g        3.375 g 100 mL/hr over 30 Minutes Intravenous  Once 08/03/20 0352 08/03/20 0408   08/03/20 0345  piperacillin-tazobactam (ZOSYN) IVPB 3.375 g  Status:  Discontinued        3.375 g 12.5 mL/hr over 240 Minutes Intravenous  Once 08/03/20 0341 08/03/20 0351   08/03/20 0015   vancomycin (VANCOREADY) IVPB 1250 mg/250 mL        1,250 mg 166.7 mL/hr over 90 Minutes Intravenous  Once 08/03/20 0002 08/03/20 0257   08/02/20 1930  metroNIDAZOLE (FLAGYL) IVPB 500 mg        500 mg 100 mL/hr over 60 Minutes Intravenous  Once 08/02/20 1928 08/02/20 2311   08/02/20 1930  cefTRIAXone (ROCEPHIN) 1 g in sodium chloride 0.9 % 100 mL IVPB        1 g 200 mL/hr over 30 Minutes Intravenous  Once 08/02/20 1928 08/02/20 2200       Subjective: Patient seen and examined at bedside.  Patient seen and examined at bedside.  Awake but confused.  Complains of some abdominal pain.  No overnight fever, vomiting or seizures reported.   Objective: Vitals:   08/09/20 0758 08/09/20 0801 08/09/20 0910 08/09/20 1013  BP: (!) 195/86 (!) 205/78 (!) 204/79 (!) 166/84  Pulse: (!) 57 62 (!) 116 60  Resp:  16  18 18   Temp: 98.9 F (37.2 C)  98.9 F (37.2 C) 98.3 F (36.8 C)  TempSrc: Oral  Oral Tympanic  SpO2: 99%  98% 100%  Weight:      Height:        Intake/Output Summary (Last 24 hours) at 08/09/2020 1022 Last data filed at 08/09/2020 0748 Gross per 24 hour  Intake 665.2 ml  Output 300 ml  Net 365.2 ml   Filed Weights   08/08/20 0743  Weight: 66 kg    Examination:  General exam: No acute distress.  Looks chronically ill. Neck: No raised JVD Respiratory system: Bilateral decreased breath sounds at bases with no wheezing.  Cardiovascular system: S1-S2 heard, intermittently tachycardic and bradycardic gastrointestinal system: Abdomen is nondistended, soft and still mildly tender in the lower quadrant.  Normal bowel sounds heard  extremities: No cyanosis or lower extremity edema present CNS: Awake and oriented to self only.  Moving extremities.  No focal neurologic deficit noted Skin: No obvious petechiae/rashes Musculoskeletal: No obvious joint deformity/swelling or tenderness Psych: Cannot be assessed because of mental status  Data Reviewed: I have personally reviewed following  labs and imaging studies  CBC: Recent Labs  Lab 08/04/20 0521 08/05/20 1605 08/06/20 0551 08/08/20 0525 08/09/20 0610  WBC 7.2 3.4* 3.5* 4.2 5.6  NEUTROABS 5.0 1.7 1.6* 1.9 2.7  HGB 7.3* 7.7* 7.8* 8.6* 8.4*  HCT 22.9* 23.7* 24.0* 26.4* 25.7*  MCV 95.0 93.7 92.3 92.0 92.8  PLT 104* 147* 152 177 338   Basic Metabolic Panel: Recent Labs  Lab 08/03/20 0434 08/03/20 0434 08/04/20 0521 08/04/20 0521 08/05/20 0605 08/06/20 0551 08/07/20 0502 08/08/20 0525 08/09/20 0610  NA 136   < > 134*   < > 138 139 137 136 136  K 4.6   < > 4.4   < > 4.2 3.4* 3.4* 3.6 3.4*  CL 102   < > 108   < > 110 101 103 104 103  CO2 20*   < > 20*   < > 14* 28 26 25 22   GLUCOSE 100*   < > 98   < > 98 105* 99 102* 102*  BUN 44*   < > 28*   < > 19 14 12 15 16   CREATININE 2.29*  2.19*   < > 2.17*   < > 1.69* 1.23* 1.24* 1.36* 1.68*  CALCIUM 9.2   < > 8.9   < > 9.4 9.3 9.4 9.6 9.9  MG 1.8   < > 2.0   < > 1.7 1.6* 1.9 1.9 1.8  PHOS 3.7  --  3.3  --   --   --   --   --  3.2   < > = values in this interval not displayed.   GFR: Estimated Creatinine Clearance: 36.4 mL/min (A) (by C-G formula based on SCr of 1.68 mg/dL (H)). Liver Function Tests: Recent Labs  Lab 08/02/20 1856 08/03/20 0434 08/04/20 0521 08/05/20 0605  AST 78* 68* 36 33  ALT 80* 79* 55* 46*  ALKPHOS 360* 314* 259* 238*  BILITOT 1.4* 2.0* 0.9 0.8  PROT 7.7 6.7 6.6 6.6  ALBUMIN 3.2* 2.8* 2.6* 2.8*   Recent Labs  Lab 08/02/20 2020  LIPASE 24   No results for input(s): AMMONIA in the last 168 hours. Coagulation Profile: Recent Labs  Lab 08/02/20 2020  INR 1.1   Cardiac Enzymes: No results for input(s): CKTOTAL, CKMB, CKMBINDEX, TROPONINI in the last 168 hours. BNP (last 3  results) No results for input(s): PROBNP in the last 8760 hours. HbA1C: No results for input(s): HGBA1C in the last 72 hours. CBG: Recent Labs  Lab 08/09/20 0848  GLUCAP 96   Lipid Profile: No results for input(s): CHOL, HDL, LDLCALC, TRIG,  CHOLHDL, LDLDIRECT in the last 72 hours. Thyroid Function Tests: No results for input(s): TSH, T4TOTAL, FREET4, T3FREE, THYROIDAB in the last 72 hours. Anemia Panel: No results for input(s): VITAMINB12, FOLATE, FERRITIN, TIBC, IRON, RETICCTPCT in the last 72 hours. Sepsis Labs: Recent Labs  Lab 08/02/20 1856 08/02/20 2020 08/04/20 0521  PROCALCITON  --   --  43.60  LATICACIDVEN 1.5 1.0  --     Recent Results (from the past 240 hour(s))  Blood Culture (routine x 2)     Status: Abnormal   Collection Time: 08/02/20  7:26 PM   Specimen: BLOOD  Result Value Ref Range Status   Specimen Description   Final    BLOOD BLOOD RIGHT ARM Performed at [facility] [address] [place], [place] [address]    Special Requests   Final    BOTTLES DRAWN AEROBIC AND ANAEROBIC Blood Culture results may not be optimal due to an excessive volume of blood received in culture bottles Performed at [facility] [address], [place], [place] [address]    Culture  Setup Time   Final    GRAM NEGATIVE RODS IN BOTH AEROBIC AND ANAEROBIC BOTTLES CRITICAL VALUE NOTED.  VALUE IS CONSISTENT WITH PREVIOUSLY REPORTED AND CALLED VALUE.    Culture (A)  Final    ENTEROBACTER CLOACAE SUSCEPTIBILITIES PERFORMED ON PREVIOUS CULTURE WITHIN THE LAST 5 DAYS. Performed at [facility], [place] [address] [place], [place] [address]    Report Status 08/05/2020 FINAL  Final  Urine culture     Status: None   Collection Time: 08/02/20  7:26 PM   Specimen: In/Out Cath Urine  Result Value Ref Range Status   Specimen Description   Final    IN/OUT CATH URINE Performed at [facility] [address], [place], [place] [address]    Special Requests   Final    NONE Performed at [facility], [place] [address], [place], [place] [address]    Culture   Final    NO GROWTH Performed at [facility], [place] [address] [place], [place]  [address]    Report Status 08/04/2020 FINAL  Final  Blood Culture (routine x 2)     Status: Abnormal   Collection Time: 08/02/20  7:31 PM   Specimen: BLOOD  Result Value Ref Range Status   Specimen Description   Final    BLOOD FEM LINE Performed at [facility] [address] [place], [place] [address]    Special Requests   Final    BOTTLES DRAWN AEROBIC AND ANAEROBIC Blood Culture adequate volume Performed at [facility] [address] [place], [place] [address]    Culture  Setup Time   Final    GRAM NEGATIVE RODS IN BOTH AEROBIC AND ANAEROBIC BOTTLES Organism ID to follow CRITICAL RESULT CALLED TO, READ BACK BY AND VERIFIED WITH: J. LEGGE PHARMD, AT 1300 08/03/20 BY D. VANHOOK Performed at [facility], [place] [address] [place],  [address]    Culture ENTEROBACTER CLOACAE (A)  Final   Report Status 08/05/2020 FINAL  Final   Organism ID, Bacteria ENTEROBACTER CLOACAE  Final  Susceptibility   Enterobacter cloacae - MIC*    CEFAZOLIN >=64 RESISTANT Resistant     CEFEPIME <=0.12 SENSITIVE Sensitive     CEFTAZIDIME <=1 SENSITIVE Sensitive     CIPROFLOXACIN 0.5 SENSITIVE Sensitive     GENTAMICIN <=1 SENSITIVE Sensitive     IMIPENEM <=0.25 SENSITIVE Sensitive     TRIMETH/SULFA >=320 RESISTANT Resistant     PIP/TAZO <=4 SENSITIVE Sensitive     * ENTEROBACTER CLOACAE  Blood Culture ID Panel (Reflexed)     Status: Abnormal   Collection Time: 08/02/20  7:31 PM  Result Value Ref Range Status   Enterococcus faecalis NOT DETECTED NOT DETECTED Final   Enterococcus Faecium NOT DETECTED NOT DETECTED Final   Listeria monocytogenes NOT DETECTED NOT DETECTED Final   Staphylococcus species NOT DETECTED NOT DETECTED Final   Staphylococcus aureus (BCID) NOT DETECTED NOT DETECTED Final   Staphylococcus epidermidis NOT DETECTED NOT DETECTED Final   Staphylococcus lugdunensis NOT DETECTED NOT DETECTED Final   Streptococcus species NOT DETECTED NOT DETECTED  Final   Streptococcus agalactiae NOT DETECTED NOT DETECTED Final   Streptococcus pneumoniae NOT DETECTED NOT DETECTED Final   Streptococcus pyogenes NOT DETECTED NOT DETECTED Final   A.calcoaceticus-baumannii NOT DETECTED NOT DETECTED Final   Bacteroides fragilis NOT DETECTED NOT DETECTED Final   Enterobacterales DETECTED (A) NOT DETECTED Final    Comment: Enterobacterales represent a large order of gram negative bacteria, not a single organism. CRITICAL RESULT CALLED TO, READ BACK BY AND VERIFIED WITH: J. LEGGE PHARMD, AT 1300 08/03/20 BY D. VANHOOK    Enterobacter cloacae complex DETECTED (A) NOT DETECTED Final    Comment: CRITICAL RESULT CALLED TO, READ BACK BY AND VERIFIED WITH: J. LEGGE PHARMD, AT 1300 08/03/20 BY D. VANHOOK    Escherichia coli NOT DETECTED NOT DETECTED Final   Klebsiella aerogenes NOT DETECTED NOT DETECTED Final   Klebsiella oxytoca NOT DETECTED NOT DETECTED Final   Klebsiella pneumoniae NOT DETECTED NOT DETECTED Final   Proteus species NOT DETECTED NOT DETECTED Final   Salmonella species NOT DETECTED NOT DETECTED Final   Serratia marcescens NOT DETECTED NOT DETECTED Final   Haemophilus influenzae NOT DETECTED NOT DETECTED Final   Neisseria meningitidis NOT DETECTED NOT DETECTED Final   Pseudomonas aeruginosa NOT DETECTED NOT DETECTED Final   Stenotrophomonas maltophilia NOT DETECTED NOT DETECTED Final   Candida albicans NOT DETECTED NOT DETECTED Final   Candida auris NOT DETECTED NOT DETECTED Final   Candida glabrata NOT DETECTED NOT DETECTED Final   Candida krusei NOT DETECTED NOT DETECTED Final   Candida parapsilosis NOT DETECTED NOT DETECTED Final   Candida tropicalis NOT DETECTED NOT DETECTED Final   Cryptococcus neoformans/gattii NOT DETECTED NOT DETECTED Final   CTX-M ESBL NOT DETECTED NOT DETECTED Final   Carbapenem resistance IMP NOT DETECTED NOT DETECTED Final   Carbapenem resistance KPC NOT DETECTED NOT DETECTED Final   Carbapenem resistance NDM NOT  DETECTED NOT DETECTED Final   Carbapenem resist OXA 48 LIKE NOT DETECTED NOT DETECTED Final   Carbapenem resistance VIM NOT DETECTED NOT DETECTED Final    Comment: Performed at [facility], [address] [place], [place] [address]  SARS Coronavirus 2 by RT PCR (hospital order, performed in [facility] hospital lab) Nasopharyngeal Nasopharyngeal Swab     Status: None   Collection Time: 08/02/20 10:40 PM   Specimen: Nasopharyngeal Swab  Result Value Ref Range Status   SARS Coronavirus 2 NEGATIVE NEGATIVE Final    Comment: (NOTE) SARS-CoV-2 target nucleic acids  are NOT DETECTED.  The SARS-CoV-2 RNA is generally detectable in upper and lower respiratory specimens during the acute phase of infection. The lowest concentration of SARS-CoV-2 viral copies this assay can detect is 250 copies / mL. A negative result does not preclude SARS-CoV-2 infection and should not be used as the sole basis for treatment or other patient management decisions.  A negative result may occur with improper specimen collection / handling, submission of specimen other than nasopharyngeal swab, presence of viral mutation(s) within the areas targeted by this assay, and inadequate number of viral copies (<250 copies / mL). A negative result must be combined with clinical observations, patient history, and epidemiological information.  Fact Sheet for Patients:   StrictlyIdeas.no  Fact Sheet for Healthcare Providers: BankingDealers.co.za  This test is not yet approved or  cleared by the [place] FDA and has been authorized for detection and/or diagnosis of SARS-CoV-2 by FDA under an Emergency Use Authorization (EUA).  This EUA will remain in effect (meaning this test can be used) for the duration of the COVID-19 declaration under Section 564(b)(1) of the Act, 21 U.S.C. section 360bbb-3(b)(1), unless the authorization is terminated or revoked  sooner.  Performed at [facility], [place] [address], [place], [place] [address]   Culture, blood (routine x 2)     Status: None (Preliminary result)   Collection Time: 08/07/20  7:29 AM   Specimen: BLOOD  Result Value Ref Range Status   Specimen Description   Final    BLOOD BLOOD RIGHT HAND Performed at [facility] [address], [place], [place] [address]    Special Requests   Final    BOTTLES DRAWN AEROBIC ONLY Blood Culture adequate volume Performed at [facility] [address] [place], [place] [address]    Culture   Final    NO GROWTH 2 DAYS Performed at [facility] [address], [place], [place] [address]    Report Status PENDING  Incomplete  Culture, blood (routine x 2)     Status: None (Preliminary result)   Collection Time: 08/07/20  7:29 AM   Specimen: BLOOD  Result Value Ref Range Status   Specimen Description   Final    BLOOD BLOOD RIGHT HAND Performed at [facility] [address] [place], [place] [address]    Special Requests   Final    BOTTLES DRAWN AEROBIC ONLY Blood Culture adequate volume Performed at [facility] [address], [place], [place] [address]    Culture   Final    NO GROWTH 2 DAYS Performed at [facility] [address], [place], [place] [address]    Report Status PENDING  Incomplete         Radiology Studies: DG Abd 2 Views  Result Date: 08/08/2020 CLINICAL DATA:  Follow-up ileus.  Central abdominal discomfort. EXAM: X-RAY ABDOMEN 2 VIEWS COMPARISON:  08/07/2020 FINDINGS: Right lower extremity central venous catheter is noted with tip at the inferior cavoatrial junction. Pneumobilia is again identified. No significant interval change in the appearance of gaseous distension of the colon. IMPRESSION: 1. No significant interval change in gaseous distension of the colon compatible with colonic ileus. 2. Pneumobilia.  Electronically Signed   By: Kerby Moors M.D.   On: 08/08/2020 11:01   DG Abd 2 Views  Result Date: 08/07/2020 CLINICAL DATA:  Epigastric pain, history of Crohn's disease and small bowel disease EXAM: X-RAY ABDOMEN 2 VIEWS COMPARISON:  CT 08/02/2020 FINDINGS: There is diffuse gaseous distention of the colon, nonspecific. Surgical material is seen throughout the right hemiabdomen and to the left of midline compatible with patient's extensive surgical history. Additional clips noted in the region of the gallbladder fossa and along the surface of the liver are also compatible with the history of cholecystectomy. No suspicious calcifications. Few phleboliths in the pelvis. Right lower extremity PICC terminates at the inferior cavoatrial junction. Cardiomediastinal contours as included are unremarkable. Lung bases demonstrate some atelectatic changes but are otherwise clear. IMPRESSION: 1. Diffuse gaseous distention of the colon, nonspecific. Correlate with features of ileus. 2. Postsurgical changes throughout the abdomen compatible with patient's extensive surgical history. 3. Right lower extremity PICC terminates at the inferior cavoatrial junction. Electronically Signed   By: Lovena Le M.D.   On: 08/07/2020 15:25        Scheduled Meds: . amLODipine  10 mg Oral Daily  . budesonide  3 mg Oral Daily  . buPROPion  200 mg Oral Daily  . Chlorhexidine Gluconate Cloth  6 each Topical Daily  . cholecalciferol  1,000 Units Oral Daily  . cycloSPORINE  1 drop Both Eyes BID  . DULoxetine  90 mg Oral Daily  . estradiol  2 mg Oral Daily  . heparin  5,000 Units Subcutaneous Q8H  . insulin aspart  0-9 Units Subcutaneous Q4H  . methadone  5 mg Oral 5 X Daily  . metoprolol tartrate  100 mg Oral BID  . omega-3 acid ethyl esters  1,000 mg Oral Daily  . pantoprazole  40 mg Oral Daily  . senna-docusate  1 tablet Oral BID  . sucralfate  1 g Oral TID WC & HS  . vitamin B-12  1,000 mcg Oral Daily   Continuous  Infusions: . sodium chloride 250 mL (08/08/20 1224)  . ceFEPime (MAXIPIME) IV 2 g (08/09/20 0316)  . dextrose 5 % and 0.9 % NaCl with KCl 40 mEq/L    . magnesium sulfate bolus IVPB 1 g (08/09/20 1018)  . potassium chloride            Aline August, MD [facility] 08/09/2020, 10:22 AM

## 2020-08-09 NOTE — Progress Notes (Signed)
Noted that patient has Cefepime listed as an allergy (please see listed allergies). Spoke with pharmacy and MD. MD states to give dose due now and then have pharmacy stop medication. Will alert pharmacy.

## 2020-08-09 NOTE — Progress Notes (Signed)
Failed attempt at MRI today. Patient confused and unable to remain still for procedure. Patient stated " I need my dope."

## 2020-08-10 ENCOUNTER — Inpatient Hospital Stay (HOSPITAL_COMMUNITY): Payer: Medicare Other

## 2020-08-10 DIAGNOSIS — E876 Hypokalemia: Secondary | ICD-10-CM

## 2020-08-10 DIAGNOSIS — I10 Essential (primary) hypertension: Secondary | ICD-10-CM

## 2020-08-10 DIAGNOSIS — K56600 Partial intestinal obstruction, unspecified as to cause: Secondary | ICD-10-CM

## 2020-08-10 DIAGNOSIS — D631 Anemia in chronic kidney disease: Secondary | ICD-10-CM

## 2020-08-10 DIAGNOSIS — G9341 Metabolic encephalopathy: Secondary | ICD-10-CM

## 2020-08-10 DIAGNOSIS — N1832 Chronic kidney disease, stage 3b: Secondary | ICD-10-CM

## 2020-08-10 DIAGNOSIS — R935 Abnormal findings on diagnostic imaging of other abdominal regions, including retroperitoneum: Secondary | ICD-10-CM

## 2020-08-10 LAB — CBC
HCT: 25.6 % — ABNORMAL LOW (ref 36.0–46.0)
Hemoglobin: 8.3 g/dL — ABNORMAL LOW (ref 12.0–15.0)
MCH: 30.6 pg (ref 26.0–34.0)
MCHC: 32.4 g/dL (ref 30.0–36.0)
MCV: 94.5 fL (ref 80.0–100.0)
Platelets: 192 10*3/uL (ref 150–400)
RBC: 2.71 MIL/uL — ABNORMAL LOW (ref 3.87–5.11)
RDW: 14.5 % (ref 11.5–15.5)
WBC: 4.1 10*3/uL (ref 4.0–10.5)
nRBC: 0 % (ref 0.0–0.2)

## 2020-08-10 LAB — COMPREHENSIVE METABOLIC PANEL
ALT: UNDETERMINED U/L (ref 0–44)
ALT: 40 U/L (ref 0–44)
AST: UNDETERMINED U/L (ref 15–41)
AST: 23 U/L (ref 15–41)
Albumin: UNDETERMINED g/dL (ref 3.5–5.0)
Albumin: 2.9 g/dL — ABNORMAL LOW (ref 3.5–5.0)
Alkaline Phosphatase: UNDETERMINED U/L (ref 38–126)
Alkaline Phosphatase: 203 U/L — ABNORMAL HIGH (ref 38–126)
Anion gap: UNDETERMINED (ref 5–15)
Anion gap: 8 (ref 5–15)
BUN: UNDETERMINED mg/dL (ref 6–20)
BUN: 13 mg/dL (ref 6–20)
CO2: UNDETERMINED mmol/L (ref 22–32)
CO2: 21 mmol/L — ABNORMAL LOW (ref 22–32)
Calcium: UNDETERMINED mg/dL (ref 8.9–10.3)
Calcium: 10 mg/dL (ref 8.9–10.3)
Chloride: UNDETERMINED mmol/L (ref 98–111)
Chloride: 109 mmol/L (ref 98–111)
Creatinine, Ser: UNDETERMINED mg/dL (ref 0.44–1.00)
Creatinine, Ser: 1.4 mg/dL — ABNORMAL HIGH (ref 0.44–1.00)
GFR calc Af Amer: UNDETERMINED mL/min (ref >60)
GFR calc Af Amer: 48 mL/min — ABNORMAL LOW (ref >60)
GFR calc non Af Amer: UNDETERMINED mL/min (ref >60)
GFR calc non Af Amer: 41 mL/min — ABNORMAL LOW (ref >60)
Glucose, Bld: UNDETERMINED mg/dL (ref 70–99)
Glucose, Bld: 124 mg/dL — ABNORMAL HIGH (ref 70–99)
Potassium: UNDETERMINED mmol/L (ref 3.5–5.1)
Potassium: 3.6 mmol/L (ref 3.5–5.1)
Sodium: UNDETERMINED mmol/L (ref 135–145)
Sodium: 138 mmol/L (ref 135–145)
Total Bilirubin: UNDETERMINED mg/dL (ref 0.3–1.2)
Total Bilirubin: 0.9 mg/dL (ref 0.3–1.2)
Total Protein: UNDETERMINED g/dL (ref 6.5–8.1)
Total Protein: 7.1 g/dL (ref 6.5–8.1)

## 2020-08-10 LAB — VITAMIN B12: Vitamin B-12: 2289 pg/mL — ABNORMAL HIGH (ref 180–914)

## 2020-08-10 LAB — DIFFERENTIAL
Abs Immature Granulocytes: 0.01 10*3/uL (ref 0.00–0.07)
Basophils Absolute: 0 10*3/uL (ref 0.0–0.1)
Basophils Relative: 1 %
Eosinophils Absolute: 0.1 10*3/uL (ref 0.0–0.5)
Eosinophils Relative: 3 %
Immature Granulocytes: 0 %
Lymphocytes Relative: 41 %
Lymphs Abs: 1.7 10*3/uL (ref 0.7–4.0)
Monocytes Absolute: 0.2 10*3/uL (ref 0.1–1.0)
Monocytes Relative: 6 %
Neutro Abs: 2.1 10*3/uL (ref 1.7–7.7)
Neutrophils Relative %: 49 %

## 2020-08-10 LAB — FOLATE: Folate: 51.4 ng/mL (ref >5.9)

## 2020-08-10 LAB — TSH: TSH: 1.07 u[IU]/mL (ref 0.350–4.500)

## 2020-08-10 LAB — GLUCOSE, CAPILLARY
Glucose-Capillary: 121 mg/dL — ABNORMAL HIGH (ref 70–99)
Glucose-Capillary: 121 mg/dL — ABNORMAL HIGH (ref 70–99)
Glucose-Capillary: 122 mg/dL — ABNORMAL HIGH (ref 70–99)
Glucose-Capillary: 124 mg/dL — ABNORMAL HIGH (ref 70–99)
Glucose-Capillary: 127 mg/dL — ABNORMAL HIGH (ref 70–99)

## 2020-08-10 LAB — AMMONIA
Ammonia: UNDETERMINED umol/L (ref 9–35)
Ammonia: 33 umol/L (ref 9–35)

## 2020-08-10 LAB — MAGNESIUM
Magnesium: UNDETERMINED mg/dL (ref 1.7–2.4)
Magnesium: 1.8 mg/dL (ref 1.7–2.4)

## 2020-08-10 LAB — PHOSPHORUS
Phosphorus: UNDETERMINED mg/dL (ref 2.5–4.6)
Phosphorus: 2.5 mg/dL (ref 2.5–4.6)

## 2020-08-10 LAB — PREALBUMIN: Prealbumin: 29.8 mg/dL (ref 18–38)

## 2020-08-10 LAB — TRIGLYCERIDES
Triglycerides: UNDETERMINED mg/dL (ref <150)
Triglycerides: 125 mg/dL (ref <150)

## 2020-08-10 MED ORDER — INSULIN ASPART 100 UNIT/ML ~~LOC~~ SOLN
0.0000 [IU] | Freq: Three times a day (TID) | SUBCUTANEOUS | Status: DC
  Administered 2020-08-10 (×2): 1 [IU] via SUBCUTANEOUS

## 2020-08-10 MED ORDER — LORAZEPAM 2 MG/ML IJ SOLN: 1.0000 mg | Freq: Two times a day (BID) | INTRAMUSCULAR | Status: DC | PRN

## 2020-08-10 MED ORDER — TRAVASOL 10 % IV SOLN
INTRAVENOUS | Status: AC
  Filled 2020-08-10: qty 998.4

## 2020-08-10 MED ORDER — LORAZEPAM 2 MG/ML IJ SOLN
INTRAMUSCULAR | Status: AC
  Administered 2020-08-10: 0.5 mg via INTRAVENOUS
  Filled 2020-08-10: qty 1

## 2020-08-10 MED ORDER — POTASSIUM PHOSPHATES 15 MMOLE/5ML IV SOLN
15.0000 mmol | Freq: Once | INTRAVENOUS | Status: AC
  Administered 2020-08-10: 15 mmol via INTRAVENOUS
  Filled 2020-08-10: qty 5

## 2020-08-10 MED ORDER — LORAZEPAM 2 MG/ML IJ SOLN: 0.5000 mg | Freq: Two times a day (BID) | INTRAMUSCULAR | Status: DC | PRN

## 2020-08-10 NOTE — Progress Notes (Signed)
PHARMACY - TOTAL PARENTERAL NUTRITION CONSULT NOTE   Indication: Short bowel syndrome  Patient Measurements: Height: 5' 8"  (172.7 cm) Weight: 66 kg (145 lb 8.1 oz) IBW/kg (Calculated) : 63.9 TPN AdjBW (KG): 66 Body mass index is 22.12 kg/m.  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 TPN has been on hold since admission. Left femoral access was removed 9/10 and R femoral access placed. With repeat blood cultures negative so far, plan is to resume TPN. Cyclic TPN is managed by [facility] in the outpatient setting. Formula is in the media section of the chart. Patient is able to eat some food but absorption is unreliable with short gut syndrome per RD.   Glucose / Insulin: CBGs < 150/ 3 units SSI/24h Electrolytes: WNL though phos with significant decrease since initiating TPN Renal: AKI on CKD with improving SCr LFTs / TGs: WNL/ WNL Prealbumin / albumin: 29.8(9/15)/ 2.9 Intake / Output; MIVF: some oral intake, D5NSw/40 meq KCl @ 35/ UOP 0.3 ml/kg/hr GI Imaging: 9/7 CT abdomen/pelvis neg Surgeries / Procedures: See central access  Central access: 9/10 IR removal of L femoral CVC and placement of R femoral CVC TPN start date: 9/14  Nutritional Goals (per RD recommendation on 9/10): kCal: 2000-2200, Protein: 100-110 g, Fluid: 2L/day Goal TPN rate is 80 mL/hr (provides 100 g of protein and 2185 kcals per day)  Current Nutrition:  TPN @ 1/2 goal rate, MIVF @ 35 ml/hr  Plan:  Now: potassium phosphate 15 mmol iv once  Increase TPN to goal rate at 80 mL/hr at 1800 providing 2185 kcal and 100 g protein If patient tolerates goal rate, will consider converting to cyclic TPN 8/32 Will continue with standard electrolytes in TPN for now and closely monitor in the setting of CKD Electrolytes in TPN: 71mq/L of Na, 5108m/L of K, 70m78mL of Ca, 70mE76m of Mg, and 170mm36m of Phos. Cl:Ac ratio 1:1 Continue standard MVI and  trace elements to TPN Reduce to  Sensitive q8h SSI and adjust as needed  Discuss D/C MIVF if OK w/ TRH Monitor TPN labs on Mon/Thurs  ChrisNapoleon Form/2021,9:56 AM

## 2020-08-10 NOTE — Progress Notes (Signed)
PROGRESS NOTE    Caitlin Peters  QTM:[id number] DOB: [birth year] DOA: 08/02/2020 PCP: [name], [name], MD    Chief Complaint  Patient presents with   Fever    Brief Narrative:  59 y.o.femalewith medical history significant ofchronic diarrhea, hypertension, CKD stage III, chronic anemia, Crohn's disease, short gut syndrome/TPN dependent, chronic pain on methadone, malnutrition, anxiety, depression, tobacco use, recent hospital admission for Enterobacter and stenotrophomonas sepsis in the setting of long-term PICC line in the left thigh which was treated with IV antibiotics and subsequently oral Bactrim on discharge till 08/02/2020 presented on 08/02/2020 with fever.  On presentation, she was febrile with temperature of 100.8 along with tachycardia with hypotension along with leukocytosis of 18.5 creatinine of 2.5.  COVID-19 testing was negative.  Chest x-ray was negative for infiltrates.  CT of the abdomen and pelvis did not show any obvious infectious source.  She was started on broad-spectrum antibiotics and IV fluids.  She was found to have Enterobacter bacteremia.  ID was consulted.  Subsequently IR was also consulted.   Assessment & Plan:   Principal Problem:   Sepsis ([place]) Active Problems:   Anemia   AKI (acute kidney injury) ([place])   Crohn disease ([place])   Chronic pain syndrome   Central line infection   Enterobacter sepsis ([name])   Bacteremia   Acute metabolic encephalopathy   Hypomagnesemia   Partial small bowel obstruction ([name])   Abnormal CT of the abdomen  #1 sepsis secondary to Enterobacter bacteremia Patient noted to have recently been treated for stenotrophomonas and Enterobacter bacteremia most likely related to her chronic femoral PICC line.  Patient during prior hospitalization received IV antibiotics discharged on oral Bactrim till 08/02/2020 and subsequently presented to the ED with fevers on last day of oral antibiotics.  Patient initially placed on IV  cefepime and subsequently IV Cipro for Enterobacter bacteremia.  Patient seen by IR status post removal of existing left common femoral tunneled central venous catheter and placement of right common femoral vein tunneled central venous catheter 19 2021 per Dr. Pascal Lux IR.  Patient status post 7 days IV antibiotics.  ID was following and recommended no further antibiotics on discharge.  Repeat blood cultures from 08/07/2020 with no growth to date.  TPN resumed.  ID signed off 08/08/2020.  2.  Acute metabolic encephalopathy/confusion Questionable etiology.  Could likely have been secondary to opioid pain medication.  Patient noted to have been receiving IV Dilaudid for pain control and noted to be confused and oriented to self only on 08/09/2020.  IV Dilaudid discontinued.  Head CT, MRI brain done negative for any acute abnormalities.  Patient started on gentle hydration.  No other signs or symptoms of infection.  Patient with some clinical improvement over the past 24 hours.  Continue to monitor off IV Dilaudid.  Follow.  3.  Acute kidney injury on chronic kidney disease stage IIIb/anion gap metabolic acidosis Secondary to prerenal azotemia from dehydration/sepsis.  Creatinine on admission was 2.5.  Baseline creatinine 1.6-1.7.  No evidence of obstructive uropathy on CT abdomen and pelvis.  Acidosis resolved.  Bicarb drip discontinued.  IV fluids decreased as patient started back on TPN.  Creatinine down to 1.4.  Follow.  4.  Hypokalemia Patient on TPN.  Electrolytes being repleted per pharmacy.  5.  Hypomagnesemia On TPN.  Electrolytes being repleted by pharmacy.  6.  Anemia of chronic disease Stable.  No signs of bleeding.  Transfusion threshold hemoglobin <7.  7.  Partial small bowel  obstruction Noted on CT abdomen and pelvis that patient has been complaining of some abdominal distention.  CT done with a transition zone in the region of the distal ileum.  Patient does state had flatus and bowel  movement yesterday.  Patient with history of Crohn's status post multiple surgeries.  Patient made n.p.o.  General surgery consulted and do not feel any indication for emergency surgery at this time.  We will start patient on clears.  Follow.  8.  Abnormal CT abdomen and pelvis CT abdomen and pelvis showing 2 areas of luminal narrowing with soft tissue thickening in the colon at the junction of descending and sigmoid colon in the mid sigmoid colon.  Areas somewhat apple core type appearance raise concern for foci of colonic carcinoma.  Patient with history of Crohn's on TPN.  Patient with multiple surgeries.  Consult with GI for further evaluation and management.  9.  Thrombocytopenia Resolved.  10.  Crohn's disease/short gut syndrome Continue budesonide.  TPN resumed and being managed by pharmacy.  11.  Chronic pain syndrome Continue home dose scheduled methadone.  IV Dilaudid discontinued due to confusion.  Supportive care.  12.  Hypertension Continue amlodipine and metoprolol.   DVT prophylaxis: Heparin Code Status: Full Family Communication: Updated patient.  No family at bedside. Disposition:   Status is: Inpatient    Dispo: The patient is from: Home              Anticipated d/c is to: Likely home              Anticipated d/c date is: Hopefully 1 to 2 days.              Patient currently with concerns for partial small bowel obstruction, abnormal CT abdomen and pelvis, some confusion, not stable for discharge.       Consultants:   General surgery: Dr. Nadeen Landau 08/10/2020  ID: Dr. Tommy Medal 08/04/2020    Procedures:   MRI brain 08/10/2020  CT abdomen and pelvis 08/09/2020, 08/02/2020  CT head 08/09/2020  Abdominal films 08/08/2020, 08/07/2020  Chest x-ray 08/02/2020  Successful placement of dual-lumen tunneled central venous catheter via right common femoral vein with tip terminating in superior caval atrial junction/successful removal of existing left approach  tunneled central venous catheter per IR, Dr. Pascal Lux 19 2021  Antimicrobials:   IV cefepime 08/03/2020>>>> 08/09/2020   Subjective: Patient with some complaints of some mid abdominal pain with palpation.  Patient states had flatus and bowel movement yesterday.  Patient currently n.p.o. due to concerns for partial small bowel obstruction.  Patient denies any chest pain or shortness of breath.  When asked how she is doing states she is "Co - Co for cocoa puffs. "  Patient with some confusion, alert to self and place.  Objective: Vitals:   08/10/20 0101 08/10/20 0526 08/10/20 1010 08/10/20 1401  BP: (!) 156/72 (!) 153/84 (!) 176/74 (!) 158/79  Pulse: (!) 103 (!) 54 69 (!) 55  Resp: 17 18 15 16   Temp: 98.3 F (36.8 C) 98.5 F (36.9 C) 97.7 F (36.5 C) 98.9 F (37.2 C)  TempSrc: Oral Oral Oral Oral  SpO2: 100% 100% 100% 96%  Weight:      Height:        Intake/Output Summary (Last 24 hours) at 08/10/2020 1909 Last data filed at 08/10/2020 1402 Gross per 24 hour  Intake 797.38 ml  Output 200 ml  Net 597.38 ml   Filed Weights   08/08/20 0743  Weight: 66 kg    Examination:  General exam: Appears calm and comfortable  Respiratory system: Clear to auscultation. Respiratory effort normal. Cardiovascular system: S1 & S2 heard, RRR. No JVD, murmurs, rubs, gallops or clicks. No pedal edema. Gastrointestinal system: Abdomen is nondistended, soft and some tenderness to palpation mid abdominal region, positive bowel sounds, no rebound, no guarding. Central nervous system: Alert and oriented to self and place only. No focal neurological deficits. Extremities: Symmetric 5 x 5 power. Skin: No rashes, lesions or ulcers Psychiatry: Judgement and insight appear fair. Mood & affect appropriate.     Data Reviewed: I have personally reviewed following labs and imaging studies  CBC: Recent Labs  Lab 08/05/20 1605 08/06/20 0551 08/08/20 0525 08/09/20 0610 08/10/20 0545  WBC 3.4* 3.5* 4.2  5.6 4.1  NEUTROABS 1.7 1.6* 1.9 2.7 2.1  HGB 7.7* 7.8* 8.6* 8.4* 8.3*  HCT 23.7* 24.0* 26.4* 25.7* 25.6*  MCV 93.7 92.3 92.0 92.8 94.5  PLT 147* 152 177 193 027    Basic Metabolic Panel: Recent Labs  Lab 08/04/20 0521 08/05/20 0605 08/07/20 0502 08/08/20 0525 08/09/20 0610 08/10/20 0545 08/10/20 0800  NA 134*   < > 137 136 136 SPECIMEN CONTAMINATED, UNABLE TO PERFORM TEST(S). 138  K 4.4   < > 3.4* 3.6 3.4* SPECIMEN CONTAMINATED, UNABLE TO PERFORM TEST(S). 3.6  CL 108   < > 103 104 103 SPECIMEN CONTAMINATED, UNABLE TO PERFORM TEST(S). 109  CO2 20*   < > 26 25 22  SPECIMEN CONTAMINATED, UNABLE TO PERFORM TEST(S). 21*  GLUCOSE 98   < > 99 102* 102* SPECIMEN CONTAMINATED, UNABLE TO PERFORM TEST(S). 124*  BUN 28*   < > 12 15 16  SPECIMEN CONTAMINATED, UNABLE TO PERFORM TEST(S). 13  CREATININE 2.17*   < > 1.24* 1.36* 1.68* SPECIMEN CONTAMINATED, UNABLE TO PERFORM TEST(S). 1.40*  CALCIUM 8.9   < > 9.4 9.6 9.9 SPECIMEN CONTAMINATED, UNABLE TO PERFORM TEST(S). 10.0  MG 2.0   < > 1.9 1.9 1.8 SPECIMEN CONTAMINATED, UNABLE TO PERFORM TEST(S). 1.8  PHOS 3.3  --   --   --  3.2 SPECIMEN CONTAMINATED, UNABLE TO PERFORM TEST(S). 2.5   < > = values in this interval not displayed.    GFR: Estimated Creatinine Clearance: 43.6 mL/min (A) (by C-G formula based on SCr of 1.4 mg/dL (H)).  Liver Function Tests: Recent Labs  Lab 08/04/20 0521 08/05/20 0605 08/10/20 0545 08/10/20 0800  AST 36 33 SPECIMEN CONTAMINATED, UNABLE TO PERFORM TEST(S). 23  ALT 55* 46* SPECIMEN CONTAMINATED, UNABLE TO PERFORM TEST(S). 40  ALKPHOS 259* 238* SPECIMEN CONTAMINATED, UNABLE TO PERFORM TEST(S). 203*  BILITOT 0.9 0.8 SPECIMEN CONTAMINATED, UNABLE TO PERFORM TEST(S). 0.9  PROT 6.6 6.6 SPECIMEN CONTAMINATED, UNABLE TO PERFORM TEST(S). 7.1  ALBUMIN 2.6* 2.8* SPECIMEN CONTAMINATED, UNABLE TO PERFORM TEST(S). 2.9*    CBG: Recent Labs  Lab 08/09/20 1948 08/10/20 0024 08/10/20 0439 08/10/20 0730 08/10/20 1352    GLUCAP 114* 127* 121* 121* 122*     Recent Results (from the past 240 hour(s))  Blood Culture (routine x 2)     Status: Abnormal   Collection Time: 08/02/20  7:26 PM   Specimen: BLOOD  Result Value Ref Range Status   Specimen Description   Final    BLOOD BLOOD RIGHT ARM Performed at [facility], [place] [address], [place], [place] [address]    Special Requests   Final    BOTTLES DRAWN AEROBIC AND ANAEROBIC Blood Culture results may not be optimal  due to an excessive volume of blood received in culture bottles Performed at [facility] [address], [place], [place] [address]    Culture  Setup Time   Final    GRAM NEGATIVE RODS IN BOTH AEROBIC AND ANAEROBIC BOTTLES CRITICAL VALUE NOTED.  VALUE IS CONSISTENT WITH PREVIOUSLY REPORTED AND CALLED VALUE.    Culture (A)  Final    ENTEROBACTER CLOACAE SUSCEPTIBILITIES PERFORMED ON PREVIOUS CULTURE WITHIN THE LAST 5 DAYS. Performed at [facility], [place] [address] [place], [place] [address]    Report Status 08/05/2020 FINAL  Final  Urine culture     Status: None   Collection Time: 08/02/20  7:26 PM   Specimen: In/Out Cath Urine  Result Value Ref Range Status   Specimen Description   Final    IN/OUT CATH URINE Performed at [facility] [address] [place], [place] [address]    Special Requests   Final    NONE Performed at [facility], [place] [address], [place], [place] [address]    Culture   Final    NO GROWTH Performed at [facility], [place] [address] [place], [place] [address]    Report Status 08/04/2020 FINAL  Final  Blood Culture (routine x 2)     Status: Abnormal   Collection Time: 08/02/20  7:31 PM   Specimen: BLOOD  Result Value Ref Range Status   Specimen Description   Final    BLOOD FEM LINE Performed at [place] [address], [place], [place] [address]    Special Requests   Final    BOTTLES DRAWN AEROBIC  AND ANAEROBIC Blood Culture adequate volume Performed at [facility] [address] [place],  [address]    Culture  Setup Time   Final    GRAM NEGATIVE RODS IN BOTH AEROBIC AND ANAEROBIC BOTTLES Organism ID to follow CRITICAL RESULT CALLED TO, READ BACK BY AND VERIFIED WITH: J. LEGGE PHARMD, AT 1300 08/03/20 BY D. VANHOOK Performed at [facility], [place] [address], [place],  [address]    Culture ENTEROBACTER CLOACAE (A)  Final   Report Status 08/05/2020 FINAL  Final   Organism ID, Bacteria ENTEROBACTER CLOACAE  Final      Susceptibility   Enterobacter cloacae - MIC*    CEFAZOLIN >=64 RESISTANT Resistant     CEFEPIME <=0.12 SENSITIVE Sensitive     CEFTAZIDIME <=1 SENSITIVE Sensitive     CIPROFLOXACIN 0.5 SENSITIVE Sensitive     GENTAMICIN <=1 SENSITIVE Sensitive     IMIPENEM <=0.25 SENSITIVE Sensitive     TRIMETH/SULFA >=320 RESISTANT Resistant     PIP/TAZO <=4 SENSITIVE Sensitive     * ENTEROBACTER CLOACAE  Blood Culture ID Panel (Reflexed)     Status: Abnormal   Collection Time: 08/02/20  7:31 PM  Result Value Ref Range Status   Enterococcus faecalis NOT DETECTED NOT DETECTED Final   Enterococcus Faecium NOT DETECTED NOT DETECTED Final   Listeria monocytogenes NOT DETECTED NOT DETECTED Final   Staphylococcus species NOT DETECTED NOT DETECTED Final   Staphylococcus aureus (BCID) NOT DETECTED NOT DETECTED Final   Staphylococcus epidermidis NOT DETECTED NOT DETECTED Final   Staphylococcus lugdunensis NOT DETECTED NOT DETECTED Final   Streptococcus species NOT DETECTED NOT DETECTED Final   Streptococcus agalactiae NOT DETECTED NOT DETECTED Final   Streptococcus pneumoniae NOT DETECTED NOT DETECTED Final   Streptococcus pyogenes NOT DETECTED NOT DETECTED Final  A.calcoaceticus-baumannii NOT DETECTED NOT DETECTED Final   Bacteroides fragilis NOT DETECTED NOT DETECTED Final   Enterobacterales DETECTED (A) NOT DETECTED Final    Comment:  Enterobacterales represent a large order of gram negative bacteria, not a single organism. CRITICAL RESULT CALLED TO, READ BACK BY AND VERIFIED WITH: J. LEGGE PHARMD, AT 1300 08/03/20 BY D. VANHOOK    Enterobacter cloacae complex DETECTED (A) NOT DETECTED Final    Comment: CRITICAL RESULT CALLED TO, READ BACK BY AND VERIFIED WITH: J. LEGGE PHARMD, AT 1300 08/03/20 BY D. VANHOOK    Escherichia coli NOT DETECTED NOT DETECTED Final   Klebsiella aerogenes NOT DETECTED NOT DETECTED Final   Klebsiella oxytoca NOT DETECTED NOT DETECTED Final   Klebsiella pneumoniae NOT DETECTED NOT DETECTED Final   Proteus species NOT DETECTED NOT DETECTED Final   Salmonella species NOT DETECTED NOT DETECTED Final   Serratia marcescens NOT DETECTED NOT DETECTED Final   Haemophilus influenzae NOT DETECTED NOT DETECTED Final   Neisseria meningitidis NOT DETECTED NOT DETECTED Final   Pseudomonas aeruginosa NOT DETECTED NOT DETECTED Final   Stenotrophomonas maltophilia NOT DETECTED NOT DETECTED Final   Candida albicans NOT DETECTED NOT DETECTED Final   Candida auris NOT DETECTED NOT DETECTED Final   Candida glabrata NOT DETECTED NOT DETECTED Final   Candida krusei NOT DETECTED NOT DETECTED Final   Candida parapsilosis NOT DETECTED NOT DETECTED Final   Candida tropicalis NOT DETECTED NOT DETECTED Final   Cryptococcus neoformans/gattii NOT DETECTED NOT DETECTED Final   CTX-M ESBL NOT DETECTED NOT DETECTED Final   Carbapenem resistance IMP NOT DETECTED NOT DETECTED Final   Carbapenem resistance KPC NOT DETECTED NOT DETECTED Final   Carbapenem resistance NDM NOT DETECTED NOT DETECTED Final   Carbapenem resist OXA 48 LIKE NOT DETECTED NOT DETECTED Final   Carbapenem resistance VIM NOT DETECTED NOT DETECTED Final    Comment: Performed at [facility], [address] [place], [place] [address]  SARS Coronavirus 2 by RT PCR (hospital order, performed in [facility] hospital lab) Nasopharyngeal Nasopharyngeal Swab      Status: None   Collection Time: 08/02/20 10:40 PM   Specimen: Nasopharyngeal Swab  Result Value Ref Range Status   SARS Coronavirus 2 NEGATIVE NEGATIVE Final    Comment: (NOTE) SARS-CoV-2 target nucleic acids are NOT DETECTED.  The SARS-CoV-2 RNA is generally detectable in upper and lower respiratory specimens during the acute phase of infection. The lowest concentration of SARS-CoV-2 viral copies this assay can detect is 250 copies / mL. A negative result does not preclude SARS-CoV-2 infection and should not be used as the sole basis for treatment or other patient management decisions.  A negative result may occur with improper specimen collection / handling, submission of specimen other than nasopharyngeal swab, presence of viral mutation(s) within the areas targeted by this assay, and inadequate number of viral copies (<250 copies / mL). A negative result must be combined with clinical observations, patient history, and epidemiological information.  Fact Sheet for Patients:   StrictlyIdeas.no  Fact Sheet for Healthcare Providers: BankingDealers.co.za  This test is not yet approved or  cleared by the [place] FDA and has been authorized for detection and/or diagnosis of SARS-CoV-2 by FDA under an Emergency Use Authorization (EUA).  This EUA will remain in effect (meaning this test can be used) for the duration of the COVID-19 declaration under Section 564(b)(1) of the Act, 21 U.S.C. section 360bbb-3(b)(1), unless the authorization is terminated or revoked sooner.  Performed at  [facility], [place] [address], [place], [place] [address]   Culture, blood (routine x 2)     Status: None (Preliminary result)   Collection Time: 08/07/20  7:29 AM   Specimen: BLOOD  Result Value Ref Range Status   Specimen Description   Final    BLOOD BLOOD RIGHT HAND Performed at [facility] [address], [place], [place] [address]    Special Requests   Final    BOTTLES DRAWN AEROBIC ONLY Blood Culture adequate volume Performed at [facility] [address] [place], [place] [address]    Culture   Final    NO GROWTH 3 DAYS Performed at [facility], [place] [address] [place], [place] [address]    Report Status PENDING  Incomplete  Culture, blood (routine x 2)     Status: None (Preliminary result)   Collection Time: 08/07/20  7:29 AM   Specimen: BLOOD  Result Value Ref Range Status   Specimen Description   Final    BLOOD BLOOD RIGHT HAND Performed at [facility] [address] [place], [place] [address]    Special Requests   Final    BOTTLES DRAWN AEROBIC ONLY Blood Culture adequate volume Performed at [facility] [address], [place], [place] [address]    Culture   Final    NO GROWTH 3 DAYS Performed at [facility], [place] [address] [place], [place] [address]    Report Status PENDING  Incomplete         Radiology Studies: CT HEAD WO CONTRAST  Result Date: 08/09/2020 CLINICAL DATA:  [place] EXAM: CT HEAD WITHOUT CONTRAST TECHNIQUE: Contiguous axial images were obtained from the base of the skull through the vertex without intravenous contrast. COMPARISON:  None. FINDINGS: Brain: No evidence of acute infarction, hemorrhage, hydrocephalus, extra-axial collection or mass lesion/mass effect. Mild patchy white matter hypodensity bilaterally most likely chronic microvascular ischemia. Vascular: Negative for hyperdense vessel Skull: Negative Sinuses/Orbits: Negative Other: None IMPRESSION: No acute abnormality. Mild chronic microvascular ischemic changes in the white matter. Electronically Signed   By: Franchot Gallo M.D.   On: 08/09/2020 12:49   MR BRAIN WO CONTRAST  Result Date: 08/10/2020 CLINICAL DATA:  Delirium. Hypertension, stage 3 chronic kidney disease. Crohn's disease. Smoker. EXAM: MRI HEAD  WITHOUT CONTRAST TECHNIQUE: Multiplanar, multiecho pulse sequences of the brain and surrounding structures were obtained without intravenous contrast. COMPARISON:  CT head 08/09/2020 FINDINGS: Brain: Ventricle size and cerebral volume normal. Negative for acute infarct Periventricular deep white matter hyperintensities. Given history, this is most likely chronic microvascular ischemia. Negative for hemorrhage or mass. Ossification of the tentorium on the right as noted on CT. Vascular: Normal arterial flow voids. Skull and upper cervical spine: Negative Sinuses/Orbits: Paranasal sinuses clear. Negative orbit. Right mastoid effusion. Other: None IMPRESSION: Negative for acute infarct Bilateral white matter changes most likely chronic microvascular ischemia given the clinical history and risk factors. Electronically Signed   By: Franchot Gallo M.D.   On: 08/10/2020 11:49   CT ABDOMEN PELVIS W CONTRAST  Result Date: 08/09/2020 CLINICAL DATA:  Abdominal pain EXAM: CT ABDOMEN AND PELVIS WITH CONTRAST TECHNIQUE: Multidetector CT imaging of the abdomen and pelvis was performed using the standard protocol following bolus administration of intravenous contrast. Oral contrast also administered. CONTRAST:  35m OMNIPAQUE IOHEXOL 300 MG/ML  SOLN COMPARISON:  August 02, 2020 FINDINGS: Lower chest: Lung bases are clear. Hepatobiliary: No focal liver lesions  are appreciable. Gallbladder is absent. There is air within the biliary ductal system, likely secondary to previous sphincterotomy. No portal venous air evident. No appreciable biliary duct dilatation. Pancreas: There is no evident pancreatic mass or inflammatory focus. Note that there is pancreatic duct dilatation without pancreatic duct obstructing lesion. Spleen: No splenic lesions are evident. Adrenals/Urinary Tract: Adrenals bilaterally appear normal. Kidneys bilaterally show no evident mass or hydronephrosis on either side. No renal or ureteral calculus. Urinary  bladder is midline with urinary bladder wall thickness normal. Stomach/Bowel: There is food material throughout the stomach. There are loops of dilated small bowel with a relative transition zone in the mid to distal ileum consistent with a degree of bowel obstruction. Oral contrast passes through this region, indicating incomplete bowel obstruction. Postoperative change near the terminal ileum is noted. There is dilatation of colon throughout much of its course with fluid throughout much of the colon. There is an apparent soft tissue mass near the junction of the descending colon and sigmoid colon extending over 2.5 cm with a suggested apple-core type appearance in this region. This lesion is best appreciated on coronal slice 25 series 5 but is also appreciated on axial slices 54 and 55 series 2. A smaller similar appearance in the mid sigmoid colon extends over a distance of 1.2 cm. This more distal focus of apple-core type appearance is seen best on axial slices 69 and 70 series 2. elsewhere there is fluid in the colon which suggests that there may be a degree of underlying colitis. There is no free air evident. Vascular/Lymphatic: There is no abdominal aortic aneurysm. There are foci of aortic atherosclerosis. Major venous structures appear patent. Note that there is a right femoral venous catheter with the tip in a branch of the hepatic vein on the right. No adenopathy is evident in the abdomen or pelvis. Reproductive: Uterus absent.  No adnexal masses. Other: Suspect previous appendiceal removal. No inflammation in the periappendiceal region evident. Musculoskeletal: There is degenerative change in the lumbar spine. No blastic or lytic bone lesions. There is sclerosis in the left iliac bone and in the left femoral head. 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 No intramuscular lesions are evident. IMPRESSION: 1. There are 2 areas of luminal narrowing with soft tissue thickening in the  colon, located at the junction of the descending and sigmoid colon in the mid sigmoid colon. These areas have a somewhat apple-core type appearance in raise concern for foci of colonic carcinoma. Direct visualization of these areas may well be warranted. Fluid throughout much of the colon may indicate a degree of underlying colitis. 2. There is a transition zone in the region of the distal ileum which may indicate a degree of partial bowel obstruction. Contrast flows through this area into the ascending colon. There are loops of dilated small bowel throughout much of the small bowel. There is no free air. 3. Air within the biliary ductal system is likely secondary to previous sphincterotomy. 4. Right femoral catheter has its tip in a branch of the right hepatic vein. 4. Pancreatic duct dilatation which may be indicative of a degree of pancreatitis. No pancreatic mass or pancreatic duct obstructing lesion evident. 5.  Gallbladder and uterus absent. 6.  Aortic Atherosclerosis (ICD10-I70.0). These results will be called to the ordering clinician or representative by the Radiologist Assistant, and communication documented in the PACS or [employer]. Electronically Signed   By: Lowella Grip III M.D.   On: 08/09/2020  17:03        Scheduled Meds:  amLODipine  10 mg Oral Daily   budesonide  3 mg Oral Daily   buPROPion  200 mg Oral Daily   Chlorhexidine Gluconate Cloth  6 each Topical Daily   cholecalciferol  1,000 Units Oral Daily   cycloSPORINE  1 drop Both Eyes BID   DULoxetine  90 mg Oral Daily   estradiol  2 mg Oral Daily   heparin  5,000 Units Subcutaneous Q8H   insulin aspart  0-9 Units Subcutaneous Q8H   methadone  5 mg Oral 5 X Daily   metoprolol tartrate  100 mg Oral BID   omega-3 acid ethyl esters  1,000 mg Oral Daily   pantoprazole  40 mg Oral Daily   senna-docusate  1 tablet Oral BID   sucralfate  1 g Oral TID WC & HS   vitamin B-12  1,000 mcg Oral Daily    Continuous Infusions:  sodium chloride Stopped (08/09/20 1735)   dextrose 5 % and 0.9 % NaCl with KCl 40 mEq/L 10 mL/hr at 08/10/20 1151   potassium PHOSPHATE IVPB (in mmol) 15 mmol (08/10/20 1313)   TPN ADULT (ION) 80 mL/hr at 08/10/20 1802     LOS: 8 days    Time spent: 40 minutes    Irine Seal, MD [facility]   To contact the attending provider between 7A-7P or the covering provider during after hours 7P-7A, please log into the web site [email or web address] and access using universal [place] password for that web site. If you do not have the password, please call the hospital operator.  08/10/2020, 7:09 PM

## 2020-08-10 NOTE — Consult Note (Signed)
Caitlin Peters 03-Aug-1961  [id number].    Requesting MD: Dr. Grandville Silos Chief Complaint/Reason for Consult: pSBO  HPI: Caitlin Peters is a 59 y.o. female with a history of Crohn's disease on Budesonide followed by Dr. 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   Patient was initially admitted on 9/8 with sepsis.  Patient was found to have Enterobacter bacteremia.  ID was consulted and patient was placed on antibiotics.  Source was felt to be due to chronic PICC for patient's TPN.  Patient underwent placement of new right common femoral vein central venous catheter by IR on 9/10.  Per notes patient reportedly began complaining of abdominal pain earlier in the week.  She underwent plain films of the abdomen on 9/12 and 9/13.  This showed gaseous distention of the colon compatible with colonic ileus.  A CT was obtained yesterday that showed a transition zone in the region of the distal ileum which could indicate a partial bowel obstruction.  Contrast did flow through this area to the ascending colon. We were asked to see.   The CT did also show 2 areas of luminal narrowing with soft tissue thickening in the colon, located at the junction of the descending and sigmoid colon in the mid sigmoid colon. These areas have a somewhat apple-core type appearance in raise concern for foci of colonic carcinoma. GI has been consulted for possible colonoscopy. Patients last colonoscopy was on 06/09/2018 at the [facility] and showed patent ileocolonic anastomosis, healthy mucosa, normal small ileum and colon per notes.   Patient reports that she has not had any abdominal pain during this admission. She complains of right femoral over new line. She reports she has been tolerating her diet and had applesauce this am without any n/v. RN reports patient had a loose BM  yesterday. Patient is unsure of flatus. To note, the patient has been undergoing workup for confusion by primary team. Patient reports she moved to [facility] in Nov 2020. Previously lived in [place].   Prior abdominal surgeries  - 2000 - colon resection with ostomy, complicated by post-op leak. May 2000 - repair of intestinal leak.  - June 2000 - ostomy reversal/takedown.  - 2001 - small bowel resection -?active Crohn's and blockages - 2002 - small bowel resection - 2003 - small bowel resection. Short bowel syndrome and TPN dependent since 2003. Has had multiple issues with line infections, dehydration and narcotic dependence.  - 2009 - small bowel resection (for obstruction). - 2018 - bowel surgery to lengthen the bowel (reportedly 165cm of small intestine and half the colon is remaining). - Patient has also had a total abdominal hysterectomy and cholecystectomy   ROS: Review of Systems  Constitutional: Negative for chills and fever.  Respiratory: Negative for cough.   Cardiovascular: Negative for chest pain and leg swelling.  Gastrointestinal: Positive for diarrhea. Negative for abdominal pain, nausea and vomiting.  Musculoskeletal: Positive for back pain.  Psychiatric/Behavioral: Negative for substance abuse.  All other systems reviewed and are negative.   Family History  Problem Relation Age of Onset  . Breast cancer Sister   . Multiple sclerosis Sister   . Diabetes Sister   . Lupus Sister   . Colon cancer Other   . Crohn's disease Other   . Seizures Mother   . Glaucoma Mother   .  CAD Father   . Heart disease Father   . Hypertension Father     Past Medical History:  Diagnosis Date  . Acute pancreatitis 04/13/2020  . Anasarca 10/2019  . AVN (avascular necrosis of bone) ([place])   . Cataract   . Chronic pain syndrome   . CKD (chronic kidney disease), stage III   . Crohn disease ([place])   . Crohn disease ([place])   . Depression   . Diverticulosis   . GERD (gastroesophageal reflux  disease)   . HTN (hypertension)   . IDA (iron deficiency anemia)   . Malnutrition ([place])   . Mass in chest   . Osteoporosis   . Pancreatitis   . Short gut syndrome   . Vitamin B12 deficiency     Past Surgical History:  Procedure Laterality Date  . ABDOMINAL HYSTERECTOMY    . ABDOMINAL SURGERY     colon resection with abdominal stoma, repair of intestinal leak, reversal of abdominal stoma  . BILIARY DILATION  11/26/2019   Procedure: BILIARY DILATION;  Surgeon: Jackquline Denmark, MD;  Location: [facility];  Service: Endoscopy;;  . BILIARY DILATION  03/08/2020   Procedure: BILIARY DILATION;  Surgeon: Irving Copas., MD;  Location: [place];  Service: Gastroenterology;;  . BIOPSY  03/08/2020   Procedure: BIOPSY;  Surgeon: Irving Copas., MD;  Location: [place];  Service: Gastroenterology;;  . CHEST WALL RESECTION     right thoracotomy,resection of chest mass with anterior rib and reconstruction using prosthetic mesh and video arthroscopy  . CHOLECYSTECTOMY    . COLONOSCOPY  2019  . ERCP N/A 11/26/2019   Procedure: ENDOSCOPIC RETROGRADE CHOLANGIOPANCREATOGRAPHY (ERCP);  Surgeon: Jackquline Denmark, MD;  [facility];  Service: Endoscopy;  Laterality: N/A;  . ERCP N/A 03/08/2020   Procedure: ENDOSCOPIC RETROGRADE CHOLANGIOPANCREATOGRAPHY (ERCP);  Surgeon: Irving Copas., MD;  Location: [place];  Service: Gastroenterology;  Laterality: N/A;  . ESOPHAGOGASTRODUODENOSCOPY N/A 03/08/2020   Procedure: ESOPHAGOGASTRODUODENOSCOPY (EGD);  Surgeon: Irving Copas., MD;  Location: [facility];  Service: Gastroenterology;  Laterality: N/A;  . EUS N/A 03/08/2020   Procedure: UPPER ENDOSCOPIC ULTRASOUND (EUS) LINEAR;  Surgeon: Irving Copas., MD;  Location: [place];  Service: Gastroenterology;  Laterality: N/A;  . IR FLUORO GUIDE CV LINE LEFT  01/07/2020  . IR FLUORO GUIDE CV LINE LEFT  03/09/2020  . IR FLUORO GUIDE CV LINE LEFT  05/09/2020    . IR FLUORO GUIDE CV LINE LEFT  07/20/2020  . IR FLUORO GUIDE CV LINE RIGHT  08/05/2020  . IR PTA VENOUS EXCEPT DIALYSIS CIRCUIT  01/07/2020  . IR REMOVAL TUN CV CATH W/O FL  08/05/2020  . IR US GUIDE VASC ACCESS LEFT     x 2 06/17/19 and 09/14/2019  . IR US GUIDE VASC ACCESS RIGHT  08/05/2020  . KNEE SURGERY     right knee   . REMOVAL OF STONES  11/26/2019   Procedure: REMOVAL OF STONES;  Surgeon: Jackquline Denmark, MD;  [facility];  Service: Endoscopy;;  . REMOVAL OF STONES  03/08/2020   Procedure: REMOVAL OF STONES;  Surgeon: Irving Copas., MD;  Location: [place];  Service: Gastroenterology;;  . SMALL INTESTINE SURGERY     x3  . SPHINCTEROTOMY  11/26/2019   Procedure: SPHINCTEROTOMY;  Surgeon: Jackquline Denmark, MD;  Location[facility];  Service: Endoscopy;;  . UPPER GASTROINTESTINAL ENDOSCOPY      Social History:  reports that she has quit smoking. She has never used smokeless tobacco.  She reports previous alcohol use. She reports that she does not use drugs.  Allergies:  Allergies  Allergen Reactions  . Cefepime Other (See Comments)    Neurotoxicity occurring in setting of AKI. Ceftriaxone tolerated during same admit  . Gabapentin Other (See Comments)    unknown  . Hyoscyamine Hives and Swelling    Legs swelling   Disorientation  . Lyrica [Pregabalin] Other (See Comments)    unknown  . Meperidine Hives    Other reaction(s): GI Upset Due to [place]   . Topamax [Topiramate] Other (See Comments)    unknown  . Zosyn [Piperacillin Sod-Tazobactam So]     Patient reports it makes her vomit, her neck stiff, and her "heart feel funny"  . Fentanyl Rash    Pt is allergic to fentanyl patch related to the glue (gives her a rash) Pt states she is NOT allergic to fentanyl IV medicine  . Morphine And Related Rash    Medications Prior to Admission  Medication Sig Dispense Refill  . acetaminophen (TYLENOL) 325 MG tablet Take 650 mg by mouth every 6 (six) hours  as needed for mild pain.     [name] Kitchen amLODipine (NORVASC) 10 MG tablet Take 1 tablet (10 mg total) by mouth daily. 90 tablet 1  . budesonide (ENTOCORT EC) 3 MG 24 hr capsule TAKE 3 CAPSULES BY MOUTH ONCE DAILY (Patient taking differently: Take 3 mg by mouth daily. ) 90 capsule 0  . buPROPion (WELLBUTRIN SR) 100 MG 12 hr tablet Take 2 tablets by mouth once daily (Patient taking differently: Take 200 mg by mouth daily. ) 60 tablet 5  . calcium carbonate (TUMS - DOSED IN MG ELEMENTAL CALCIUM) 500 MG chewable tablet Chew 1 tablet by mouth 3 (three) times daily as needed for indigestion or heartburn.    . cholecalciferol (VITAMIN D3) 25 MCG (1000 UT) tablet Take 1,000 Units by mouth daily.     . cycloSPORINE (RESTASIS) 0.05 % ophthalmic emulsion Place 1 drop into both eyes 2 (two) times daily.    [name] Kitchen dexlansoprazole (DEXILANT) 60 MG capsule Take 1 capsule (60 mg total) by mouth daily. 90 capsule 1  . Dextran 70-Hypromellose 0.1-0.3 % SOLN Place 1 drop into both eyes 4 (four) times daily.    [name] Kitchen dicyclomine (BENTYL) 20 MG tablet Take 20 mg by mouth 3 (three) times daily.    . diphenoxylate-atropine (LOMOTIL) 2.5-0.025 MG tablet Take 1 tablet by mouth 4 (four) times daily as needed for diarrhea or loose stools. 30 tablet 0  . DULoxetine (CYMBALTA) 30 MG capsule Take 3 capsules (90 mg total) by mouth daily. 90 capsule 1  . estradiol (ESTRACE) 2 MG tablet Take 1 tablet (2 mg total) by mouth daily. 90 tablet 1  . famotidine (PEPCID) 20 MG tablet Take 20 mg by mouth at bedtime.    . hydrALAZINE (APRESOLINE) 50 MG tablet Take 1 tablet (50 mg total) by mouth 3 (three) times daily. 90 tablet 2  . HYDROmorphone (DILAUDID) 4 MG tablet Take 1 tablet (4 mg total) by mouth every 6 (six) hours as needed for severe pain. 30 tablet 0  . lipase/protease/amylase (CREON) 36000 UNITS CPEP capsule Take 1 capsule (36,000 Units total) by mouth 3 (three) times daily as needed (with meals for digestion). 180 capsule 0  . loperamide  (IMODIUM) 2 MG capsule TAKE 1 CAPSULE BY MOUTH AS NEEDED FOR DIARRHEA OR  LOOSE  STOOLS (Patient taking differently: Take 2 mg by mouth as needed for diarrhea or loose stools. )  90 capsule 0  . methadone (DOLOPHINE) 5 MG tablet Take 1 tablet (5 mg total) by mouth 5 (five) times daily. 30 tablet 0  . metoprolol tartrate (LOPRESSOR) 100 MG tablet Take 1 tablet (100 mg total) by mouth 2 (two) times daily. 90 tablet 1  . Multiple Vitamins-Minerals (MULTIVITAMIN ADULT PO) Take 1 tablet by mouth daily.    . Omega-3 1000 MG CAPS Take 1,000 mg by mouth daily.     [name] Kitchen PRESCRIPTION MEDICATION Inject 1 each into the vein daily. Home TPN . [facility] . 1 bag for 16 hours. [phone]    . Probiotic Product (PROBIOTIC-10 PO) Take 1 capsule by mouth daily.     . sucralfate (CARAFATE) 1 GM/10ML suspension Take 10 mLs (1 g total) by mouth 4 (four) times daily -  with meals and at bedtime. 420 mL 2  . traZODone (DESYREL) 100 MG tablet Take 2 tablets (200 mg total) by mouth at bedtime as needed for sleep. 60 tablet 0  . vitamin B-12 (CYANOCOBALAMIN) 100 MCG tablet Take 1,000 mcg by mouth daily.        Physical Exam: Blood pressure (!) 176/74, pulse 69, temperature 97.7 F (36.5 C), temperature source Oral, resp. rate 15, height 5' 8"  (1.727 m), weight 66 kg, SpO2 100 %. General: pleasant, WD/WN female who is laying in bed in NAD HEENT: head is normocephalic, atraumatic.  Sclera are noninjected.  PERRL.  Ears and nose without any masses or lesions.  Mouth is pink and moist. Dentition fair Heart: regular, rate, and rhythm.  Normal s1,s2. No obvious murmurs, gallops, or rubs noted.  Palpable pedal pulses bilaterally  Lungs: CTAB, no wheezes, rhonchi, or rales noted.  Respiratory effort nonlabored Abd: Soft, ND, generalized tenderness greatest in the epigastrium without peritonitis. +BS, no masses, hernias, or organomegaly. Prior midline and colostomy scar are well healed. Right femoral  line in place with dressing c/d/i MS: no BUE/BLE edema, calves soft and nontender Skin: warm and dry with no masses, lesions, or rashes Psych: A&Ox3 with an flat affect Neuro: cranial nerves grossly intact, equal strength in BUE/BLE bilaterally, normal speech, though process intact   Results for orders placed or performed during the hospital encounter of 08/02/20 (from the past 48 hour(s))  Basic metabolic panel     Status: Abnormal   Collection Time: 08/09/20  6:10 AM  Result Value Ref Range   Sodium 136 135 - 145 mmol/L   Potassium 3.4 (L) 3.5 - 5.1 mmol/L   Chloride 103 98 - 111 mmol/L   CO2 22 22 - 32 mmol/L   Glucose, Bld 102 (H) 70 - 99 mg/dL    Comment: Glucose reference range applies only to samples taken after fasting for at least 8 hours.   BUN 16 6 - 20 mg/dL   Creatinine, Ser 1.68 (H) 0.44 - 1.00 mg/dL   Calcium 9.9 8.9 - 10.3 mg/dL   GFR calc non Af Amer 33 (L) >60 mL/min   GFR calc Af Amer 38 (L) >60 mL/min   Anion gap 11 5 - 15    Comment: Performed at [facility], [place] [address], [place], [place] [address]  CBC with Differential/Platelet     Status: Abnormal   Collection Time: 08/09/20  6:10 AM  Result Value Ref Range   WBC 5.6 4.0 - 10.5 K/uL   RBC 2.77 (L) 3.87 - 5.11 MIL/uL   Hemoglobin 8.4 (L) 12.0 - 15.0 g/dL   HCT  25.7 (L) 36 - 46 %   MCV 92.8 80.0 - 100.0 fL   MCH 30.3 26.0 - 34.0 pg   MCHC 32.7 30.0 - 36.0 g/dL   RDW 14.1 11.5 - 15.5 %   Platelets 193 150 - 400 K/uL   nRBC 0.0 0.0 - 0.2 %   Neutrophils Relative % 48 %   Neutro Abs 2.7 1.7 - 7.7 K/uL   Lymphocytes Relative 44 %   Lymphs Abs 2.5 0.7 - 4.0 K/uL   Monocytes Relative 5 %   Monocytes Absolute 0.3 0 - 1 K/uL   Eosinophils Relative 2 %   Eosinophils Absolute 0.1 0 - 0 K/uL   Basophils Relative 0 %   Basophils Absolute 0.0 0 - 0 K/uL   Immature Granulocytes 1 %   Abs Immature Granulocytes 0.03 0.00 - 0.07 K/uL    Comment: Performed at [facility],  [place] [address], [place], [place] [address]  Magnesium     Status: None   Collection Time: 08/09/20  6:10 AM  Result Value Ref Range   Magnesium 1.8 1.7 - 2.4 mg/dL    Comment: Performed at [facility], [place] [address], [place], [place] [address]  Phosphorus     Status: None   Collection Time: 08/09/20  6:10 AM  Result Value Ref Range   Phosphorus 3.2 2.5 - 4.6 mg/dL    Comment: Performed at [facility], [place] [address] [place], [place] [address]  Glucose, capillary     Status: None   Collection Time: 08/09/20  8:48 AM  Result Value Ref Range   Glucose-Capillary 96 70 - 99 mg/dL    Comment: Glucose reference range applies only to samples taken after fasting for at least 8 hours.  Urinalysis, Routine w reflex microscopic Urine, Clean Catch     Status: Abnormal   Collection Time: 08/09/20 11:45 AM  Result Value Ref Range   Color, Urine STRAW (A) YELLOW   APPearance CLEAR CLEAR   Specific Gravity, Urine 1.005 1.005 - 1.030   pH 6.0 5.0 - 8.0   Glucose, UA NEGATIVE NEGATIVE mg/dL   Hgb urine dipstick SMALL (A) NEGATIVE   Bilirubin Urine NEGATIVE NEGATIVE   Ketones, ur NEGATIVE NEGATIVE mg/dL   Protein, ur NEGATIVE NEGATIVE mg/dL   Nitrite NEGATIVE NEGATIVE   Leukocytes,Ua NEGATIVE NEGATIVE   RBC / HPF 0-5 0 - 5 RBC/hpf   WBC, UA 0-5 0 - 5 WBC/hpf   Bacteria, UA NONE SEEN NONE SEEN   Squamous Epithelial / LPF 0-5 0 - 5    Comment: Performed at [facility], [place] [address], [place], [place] [address]  Glucose, capillary     Status: None   Collection Time: 08/09/20  5:26 PM  Result Value Ref Range   Glucose-Capillary 98 70 - 99 mg/dL    Comment: Glucose reference range applies only to samples taken after fasting for at least 8 hours.  Glucose, capillary     Status: Abnormal   Collection Time: 08/09/20  7:48 PM  Result Value Ref Range   Glucose-Capillary 114 (H) 70 - 99 mg/dL    Comment: Glucose reference range applies only  to samples taken after fasting for at least 8 hours.   Comment 1 Notify RN   Glucose, capillary     Status: Abnormal   Collection Time: 08/10/20 12:24 AM  Result Value Ref Range   Glucose-Capillary 127 (H) 70 - 99 mg/dL    Comment:  Glucose reference range applies only to samples taken after fasting for at least 8 hours.   Comment 1 Notify RN   Glucose, capillary     Status: Abnormal   Collection Time: 08/10/20  4:39 AM  Result Value Ref Range   Glucose-Capillary 121 (H) 70 - 99 mg/dL    Comment: Glucose reference range applies only to samples taken after fasting for at least 8 hours.   Comment 1 Notify RN   Comprehensive metabolic panel     Status: None   Collection Time: 08/10/20  5:45 AM  Result Value Ref Range   Sodium SPECIMEN CONTAMINATED, UNABLE TO PERFORM TEST(S). 135 - 145 mmol/L    Comment: REORDERED [id number] PER BROCK,C @ 0745 ON [id number] BY POTEAT,S   Potassium SPECIMEN CONTAMINATED, UNABLE TO PERFORM TEST(S). 3.5 - 5.1 mmol/L    Comment: REORDERED [id number] PER BROCK,C @ 0745 ON [id number] BY POTEAT,S   Chloride SPECIMEN CONTAMINATED, UNABLE TO PERFORM TEST(S). 98 - 111 mmol/L    Comment: REORDERED [id number] PER BROCK,C @ 0745 ON [id number] BY POTEAT,S   CO2 SPECIMEN CONTAMINATED, UNABLE TO PERFORM TEST(S). 22 - 32 mmol/L    Comment: REORDERED [id number] PER BROCK,C @ 0745 ON [id number] BY POTEAT,S   Glucose, Bld SPECIMEN CONTAMINATED, UNABLE TO PERFORM TEST(S). 70 - 99 mg/dL    Comment: REORDERED [id number] PER BROCK,C @ 0745 ON [id number] BY POTEAT,S   BUN SPECIMEN CONTAMINATED, UNABLE TO PERFORM TEST(S). 6 - 20 mg/dL    Comment: REORDERED [id number] PER BROCK,C @ 0745 ON [id number] BY POTEAT,S   Creatinine, Ser SPECIMEN CONTAMINATED, UNABLE TO PERFORM TEST(S). 0.44 - 1.00 mg/dL    Comment: REORDERED [id number] PER BROCK,C @ 0745 ON [id number] BY POTEAT,S   Calcium SPECIMEN CONTAMINATED, UNABLE TO PERFORM TEST(S). 8.9 - 10.3 mg/dL    Comment: REORDERED [id number] PER BROCK,C @ 0745 ON [id number] BY POTEAT,S   Total Protein  SPECIMEN CONTAMINATED, UNABLE TO PERFORM TEST(S). 6.5 - 8.1 g/dL    Comment: REORDERED [id number] PER BROCK,C @ 0745 ON [id number] BY POTEAT,S   Albumin SPECIMEN CONTAMINATED, UNABLE TO PERFORM TEST(S). 3.5 - 5.0 g/dL    Comment: REORDERED [id number] PER BROCK,C @ 0745 ON [id number] BY POTEAT,S   AST SPECIMEN CONTAMINATED, UNABLE TO PERFORM TEST(S). 15 - 41 U/L    Comment: REORDERED [id number] PER BROCK,C @ 0745 ON [id number] BY POTEAT,S   ALT SPECIMEN CONTAMINATED, UNABLE TO PERFORM TEST(S). 0 - 44 U/L    Comment: REORDERED [id number] PER BROCK,C @ 0745 ON [id number] BY POTEAT,S   Alkaline Phosphatase SPECIMEN CONTAMINATED, UNABLE TO PERFORM TEST(S). 38 - 126 U/L    Comment: REORDERED [id number] PER BROCK,C @ 0745 ON [id number] BY POTEAT,S   Total Bilirubin SPECIMEN CONTAMINATED, UNABLE TO PERFORM TEST(S). 0.3 - 1.2 mg/dL    Comment: REORDERED [id number] PER BROCK,C @ 0745 ON [id number] BY POTEAT,S   GFR calc non Af Amer SPECIMEN CONTAMINATED, UNABLE TO PERFORM TEST(S). >60 mL/min    Comment: REORDERED [id number] PER BROCK,C @ 0745 ON [id number] BY POTEAT,S   GFR calc Af Amer SPECIMEN CONTAMINATED, UNABLE TO PERFORM TEST(S). >60 mL/min    Comment: REORDERED [id number] PER BROCK,C @ 0745 ON [id number] BY POTEAT,S   Anion gap SPECIMEN CONTAMINATED, UNABLE TO PERFORM TEST(S). 5 - 15    Comment: REORDERED [id number] PER BROCK,C @ 6384 YK [id number] BY POTEAT,S Performed at [facility], [place] [address] [place], [place] [address]   Magnesium     Status: None   Collection Time: 08/10/20  5:45 AM  Result Value Ref Range   Magnesium SPECIMEN CONTAMINATED, UNABLE TO PERFORM TEST(S). 1.7 - 2.4 mg/dL    Comment: REORDERED [id number] PER BROCK,C @ 0745 ON [id number] BY POTEAT,S Performed at [place] [address], [place], [place] [address]   Phosphorus     Status: None   Collection Time: 08/10/20  5:45 AM  Result Value Ref Range   Phosphorus SPECIMEN CONTAMINATED, UNABLE TO PERFORM TEST(S). 2.5 - 4.6 mg/dL    Comment: REORDERED  [id number] PER BROCK,C @ 0745 ON [id number] BY POTEAT,S Performed at [place] [address], [place], [place] [address]   CBC     Status: Abnormal   Collection Time: 08/10/20  5:45 AM  Result Value Ref Range   WBC 4.1 4.0 - 10.5 K/uL   RBC 2.71 (L) 3.87 - 5.11 MIL/uL   Hemoglobin 8.3 (L) 12.0 - 15.0 g/dL   HCT 25.6 (L) 36 - 46 %   MCV 94.5 80.0 - 100.0 fL   MCH 30.6 26.0 - 34.0 pg   MCHC 32.4 30.0 - 36.0 g/dL   RDW 14.5 11.5 - 15.5 %   Platelets 192 150 - 400 K/uL   nRBC 0.0 0.0 - 0.2 %    Comment: Performed at [facility], [place] [address], [place], [place] [address]  Differential     Status: None   Collection Time: 08/10/20  5:45 AM  Result Value Ref Range   Neutrophils Relative % 49 %   Neutro Abs 2.1 1.7 - 7.7 K/uL   Lymphocytes Relative 41 %   Lymphs Abs 1.7 0.7 - 4.0 K/uL   Monocytes Relative 6 %   Monocytes Absolute 0.2 0 - 1 K/uL   Eosinophils Relative 3 %   Eosinophils Absolute 0.1 0 - 0 K/uL   Basophils Relative 1 %   Basophils Absolute 0.0 0 - 0 K/uL   Immature Granulocytes 0 %   Abs Immature Granulocytes 0.01 0.00 - 0.07 K/uL    Comment: Performed at [facility], [place] [address] [place], [place] [address]  Prealbumin     Status: None   Collection Time: 08/10/20  5:55 AM  Result Value Ref Range   Prealbumin 29.8 18 - 38 mg/dL    Comment: Performed at [facility], [place] [address], [place], [place] [address]  Triglycerides     Status: None   Collection Time: 08/10/20  5:55 AM  Result Value Ref Range   Triglycerides SPECIMEN CONTAMINATED, UNABLE TO PERFORM TEST(S). <150 mg/dL    Comment: REORDERED [id number] PER BROCK,C @ 0745 ON [id number] BY POTEAT,S Performed at [place] [address], [place], [place] [address] CORRECTED ON 09/15 AT 0759: PREVIOUSLY REPORTED AS 267   Vitamin B12     Status: Abnormal   Collection Time: 08/10/20  5:55 AM  Result Value Ref Range    Vitamin B-12 2,289 (H) 180 - 914 pg/mL    Comment: RESULTS CONFIRMED BY MANUAL DILUTION (NOTE) This assay is not validated for testing neonatal or myeloproliferative syndrome specimens for Vitamin B12 levels. Performed at [facility], [place] [address] [place], [place] [address]   Folate     Status: None   Collection Time: 08/10/20  5:55 AM  Result Value Ref Range   Folate 51.4 >5.9 ng/mL    Comment: RESULTS CONFIRMED BY MANUAL DILUTION Performed at [place] [address] [place], [place] [address]   TSH  Status: None   Collection Time: 08/10/20  5:55 AM  Result Value Ref Range   TSH 1.070 0.350 - 4.500 uIU/mL    Comment: Performed by a 3rd Generation assay with a functional sensitivity of <=0.01 uIU/mL. Performed at [facility], [place] [address] [place], [place] [address]   Ammonia     Status: None   Collection Time: 08/10/20  5:59 AM  Result Value Ref Range   Ammonia SPECIMEN CONTAMINATED, UNABLE TO PERFORM TEST(S). 9 - 35 umol/L    Comment: REORDERED [id number] PER BROCK,C @ 2334 ON [id number] BY POTEAT,S Performed at [place] [address], [place], [place] [address] CORRECTED ON 09/15 AT 0759: PREVIOUSLY REPORTED AS 44   Glucose, capillary     Status: Abnormal   Collection Time: 08/10/20  7:30 AM  Result Value Ref Range   Glucose-Capillary 121 (H) 70 - 99 mg/dL    Comment: Glucose reference range applies only to samples taken after fasting for at least 8 hours.  Comprehensive metabolic panel     Status: Abnormal   Collection Time: 08/10/20  8:00 AM  Result Value Ref Range   Sodium 138 135 - 145 mmol/L   Potassium 3.6 3.5 - 5.1 mmol/L   Chloride 109 98 - 111 mmol/L   CO2 21 (L) 22 - 32 mmol/L   Glucose, Bld 124 (H) 70 - 99 mg/dL    Comment: Glucose reference range applies only to samples taken after fasting for at least 8 hours.   BUN 13 6 - 20 mg/dL   Creatinine, Ser 1.40 (H) 0.44 -  1.00 mg/dL   Calcium 10.0 8.9 - 10.3 mg/dL   Total Protein 7.1 6.5 - 8.1 g/dL   Albumin 2.9 (L) 3.5 - 5.0 g/dL   AST 23 15 - 41 U/L   ALT 40 0 - 44 U/L   Alkaline Phosphatase 203 (H) 38 - 126 U/L   Total Bilirubin 0.9 0.3 - 1.2 mg/dL   GFR calc non Af Amer 41 (L) >60 mL/min   GFR calc Af Amer 48 (L) >60 mL/min   Anion gap 8 5 - 15    Comment: Performed at [facility], [place] [address], [place], [place] [address]  Magnesium     Status: None   Collection Time: 08/10/20  8:00 AM  Result Value Ref Range   Magnesium 1.8 1.7 - 2.4 mg/dL    Comment: Performed at [facility], [place] [address], [place], [place] [address]  Phosphorus     Status: None   Collection Time: 08/10/20  8:00 AM  Result Value Ref Range   Phosphorus 2.5 2.5 - 4.6 mg/dL    Comment: Performed at [place] [facility], [place] [address] [place], [place] [address]  Triglycerides     Status: None   Collection Time: 08/10/20  8:00 AM  Result Value Ref Range   Triglycerides 125 <150 mg/dL    Comment: Performed at [facility], [place] [address] [place], [place] [address]  Ammonia     Status: None   Collection Time: 08/10/20  8:00 AM  Result Value Ref Range   Ammonia 33 9 - 35 umol/L    Comment: Performed at [facility], [place] [address], [place], [place] [address]   CT HEAD WO CONTRAST  Result Date: 08/09/2020 CLINICAL DATA:  Delirium EXAM: CT HEAD WITHOUT CONTRAST TECHNIQUE: Contiguous axial images were obtained from the base of the skull through  the vertex without intravenous contrast. COMPARISON:  None. FINDINGS: Brain: No evidence of acute infarction, hemorrhage, hydrocephalus, extra-axial collection or mass lesion/mass effect. Mild patchy white matter hypodensity bilaterally most likely chronic microvascular ischemia. Vascular: Negative for hyperdense vessel Skull: Negative Sinuses/Orbits: Negative Other: None IMPRESSION: No acute  abnormality. Mild chronic microvascular ischemic changes in the white matter. Electronically Signed   By: Franchot Gallo M.D.   On: 08/09/2020 12:49   MR BRAIN WO CONTRAST  Result Date: 08/10/2020 CLINICAL DATA:  Delirium. Hypertension, stage 3 chronic kidney disease. Crohn's disease. Smoker. EXAM: MRI HEAD WITHOUT CONTRAST TECHNIQUE: Multiplanar, multiecho pulse sequences of the brain and surrounding structures were obtained without intravenous contrast. COMPARISON:  CT head 08/09/2020 FINDINGS: Brain: Ventricle size and cerebral volume normal. Negative for acute infarct Periventricular deep white matter hyperintensities. Given history, this is most likely chronic microvascular ischemia. Negative for hemorrhage or mass. Ossification of the tentorium on the right as noted on CT. Vascular: Normal arterial flow voids. Skull and upper cervical spine: Negative Sinuses/Orbits: Paranasal sinuses clear. Negative orbit. Right mastoid effusion. Other: None IMPRESSION: Negative for acute infarct Bilateral white matter changes most likely chronic microvascular ischemia given the clinical history and risk factors. Electronically Signed   By: Franchot Gallo M.D.   On: 08/10/2020 11:49   CT ABDOMEN PELVIS W CONTRAST  Result Date: 08/09/2020 CLINICAL DATA:  Abdominal pain EXAM: CT ABDOMEN AND PELVIS WITH CONTRAST TECHNIQUE: Multidetector CT imaging of the abdomen and pelvis was performed using the standard protocol following bolus administration of intravenous contrast. Oral contrast also administered. CONTRAST:  7m OMNIPAQUE IOHEXOL 300 MG/ML  SOLN COMPARISON:  August 02, 2020 FINDINGS: Lower chest: Lung bases are clear. Hepatobiliary: No focal liver lesions are appreciable. Gallbladder is absent. There is air within the biliary ductal system, likely secondary to previous sphincterotomy. No portal venous air evident. No appreciable biliary duct dilatation. Pancreas: There is no evident pancreatic mass or inflammatory  focus. Note that there is pancreatic duct dilatation without pancreatic duct obstructing lesion. Spleen: No splenic lesions are evident. Adrenals/Urinary Tract: Adrenals bilaterally appear normal. Kidneys bilaterally show no evident mass or hydronephrosis on either side. No renal or ureteral calculus. Urinary bladder is midline with urinary bladder wall thickness normal. Stomach/Bowel: There is food material throughout the stomach. There are loops of dilated small bowel with a relative transition zone in the mid to distal ileum consistent with a degree of bowel obstruction. Oral contrast passes through this region, indicating incomplete bowel obstruction. Postoperative change near the terminal ileum is noted. There is dilatation of colon throughout much of its course with fluid throughout much of the colon. There is an apparent soft tissue mass near the junction of the descending colon and sigmoid colon extending over 2.5 cm with a suggested apple-core type appearance in this region. This lesion is best appreciated on coronal slice 25 series 5 but is also appreciated on axial slices 54 and 55 series 2. A smaller similar appearance in the mid sigmoid colon extends over a distance of 1.2 cm. This more distal focus of apple-core type appearance is seen best on axial slices 69 and 70 series 2. elsewhere there is fluid in the colon which suggests that there may be a degree of underlying colitis. There is no free air evident. Vascular/Lymphatic: There is no abdominal aortic aneurysm. There are foci of aortic atherosclerosis. Major venous structures appear patent. Note that there is a right femoral venous catheter with the tip in a branch of the hepatic  vein on the right. No adenopathy is evident in the abdomen or pelvis. Reproductive: Uterus absent.  No adnexal masses. Other: Suspect previous appendiceal removal. No inflammation in the periappendiceal region evident. Musculoskeletal: There is degenerative change in the  lumbar spine. No blastic or lytic bone lesions. There is sclerosis in the left iliac bone and in the left femoral head. 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 No intramuscular lesions are evident. IMPRESSION: 1. There are 2 areas of luminal narrowing with soft tissue thickening in the colon, located at the junction of the descending and sigmoid colon in the mid sigmoid colon. These areas have a somewhat apple-core type appearance in raise concern for foci of colonic carcinoma. Direct visualization of these areas may well be warranted. Fluid throughout much of the colon may indicate a degree of underlying colitis. 2. There is a transition zone in the region of the distal ileum which may indicate a degree of partial bowel obstruction. Contrast flows through this area into the ascending colon. There are loops of dilated small bowel throughout much of the small bowel. There is no free air. 3. Air within the biliary ductal system is likely secondary to previous sphincterotomy. 4. Right femoral catheter has its tip in a branch of the right hepatic vein. 4. Pancreatic duct dilatation which may be indicative of a degree of pancreatitis. No pancreatic mass or pancreatic duct obstructing lesion evident. 5.  Gallbladder and uterus absent. 6.  Aortic Atherosclerosis (ICD10-I70.0). These results will be called to the ordering clinician or representative by the Radiologist Assistant, and communication documented in the PACS or [employer]. Electronically Signed   By: Lowella Grip III M.D.   On: 08/09/2020 17:03   Anti-infectives (From admission, onward)   Start     Dose/Rate Route Frequency Ordered Stop   08/05/20 1500  ceFEPIme (MAXIPIME) 2 g in sodium chloride 0.9 % 100 mL IVPB  Status:  Discontinued        2 g 200 mL/hr over 30 Minutes Intravenous Every 12 hours 08/05/20 1346 08/09/20 1753   08/04/20 2200  vancomycin (VANCOCIN) IVPB 1000 mg/200 mL premix  Status:  Discontinued         1,000 mg 200 mL/hr over 60 Minutes Intravenous Every 48 hours 08/03/20 0341 08/03/20 1514   08/03/20 1400  ceFEPIme (MAXIPIME) 2 g in sodium chloride 0.9 % 100 mL IVPB  Status:  Discontinued        2 g 200 mL/hr over 30 Minutes Intravenous Every 24 hours 08/03/20 1343 08/05/20 1346   08/03/20 1200  cefTAZidime (FORTAZ) 2 g in sodium chloride 0.9 % 100 mL IVPB  Status:  Discontinued        2 g 200 mL/hr over 30 Minutes Intravenous Every 12 hours 08/03/20 1044 08/03/20 1330   08/03/20 1000  piperacillin-tazobactam (ZOSYN) IVPB 3.375 g  Status:  Discontinued        3.375 g 12.5 mL/hr over 240 Minutes Intravenous Every 8 hours 08/03/20 0341 08/03/20 1044   08/03/20 0400  piperacillin-tazobactam (ZOSYN) IVPB 3.375 g        3.375 g 100 mL/hr over 30 Minutes Intravenous  Once 08/03/20 0352 08/03/20 0408   08/03/20 0345  piperacillin-tazobactam (ZOSYN) IVPB 3.375 g  Status:  Discontinued        3.375 g 12.5 mL/hr over 240 Minutes Intravenous  Once 08/03/20 0341 08/03/20 0351   08/03/20 0015  vancomycin (VANCOREADY) IVPB 1250 mg/250 mL  1,250 mg 166.7 mL/hr over 90 Minutes Intravenous  Once 08/03/20 0002 08/03/20 0257   08/02/20 1930  metroNIDAZOLE (FLAGYL) IVPB 500 mg        500 mg 100 mL/hr over 60 Minutes Intravenous  Once 08/02/20 1928 08/02/20 2311   08/02/20 1930  cefTRIAXone (ROCEPHIN) 1 g in sodium chloride 0.9 % 100 mL IVPB        1 g 200 mL/hr over 30 Minutes Intravenous  Once 08/02/20 1928 08/02/20 2200       Assessment/Plan Crohn's disease on Budesonide followed by Dr. 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 Await recs. Last colonoscopy was 2019  pSBO - CT w/ transition zone in the region of the distal ileum which could  indicate a partial bowel obstruction.  Contrast did flow through this area to the ascending colon.  - No indication for emergency surgery - Patient was tolerating diet and having bowel function without n/v. I think she would be okay for clears now, but will discuss with MD - Keep K >4 and Mg> 2 for bowel function - Mobilize for bowel function - AM xray - We will follow with you  FEN - NPO for now  VTE - SCDs, heparin subq ID - None currently  Jillyn Ledger, [facility] Surgery 08/10/2020, 12:58 PM Please see Amion for pager number during day hours 7:00am-4:30pm

## 2020-08-10 NOTE — Progress Notes (Signed)
   08/09/20 0910  Assess: MEWS Score  Temp 98.9 F (37.2 C)  BP (!) 204/79  Pulse Rate (!) 116  Resp 18  SpO2 98 %  O2 Device Room Air  Assess: MEWS Score  MEWS Temp 0  MEWS Systolic 2  MEWS Pulse 2  MEWS RR 0  MEWS LOC 0  MEWS Score 4  MEWS Score Color Red  Assess: if the MEWS score is Yellow or Red  Were vital signs taken at a resting state? Yes  Focused Assessment No change from prior assessment  Early Detection of Sepsis Score *See Row Information* Low  MEWS guidelines implemented *See Row Information* Yes  Treat  MEWS Interventions Escalated (See documentation below)  Take Vital Signs  Increase Vital Sign Frequency  Red: Q 1hr X 4 then Q 4hr X 4, if remains red, continue Q 4hrs  Escalate  MEWS: Escalate Red: discuss with charge nurse/RN and provider, consider discussing with RRT  Notify: Charge Nurse/RN  Name of Charge Nurse/RN Notified Jayden RN   Date Charge Nurse/RN Notified 08/09/20  Time Charge Nurse/RN Notified 0920  Notify: Provider  Provider Name/Title Dr. Starla Link   Date Provider Notified 08/09/20  Time Provider Notified [phone]  Notification Type Face-to-face  Notification Reason Change in status  Response See new orders  Date of Provider Response 08/09/20  Time of Provider Response 0915  Notify: Rapid Response  Name of Rapid Response RN Notified Christian RN  (Still on floor from earlier )  Document  Patient Outcome Other (Comment) (remains confused, BP meds administered )  Progress note created (see row info) Yes

## 2020-08-10 NOTE — Progress Notes (Signed)
   08/09/20 0801  Assess: MEWS Score  BP (!) 205/78  Pulse Rate 62  Assess: MEWS Score  MEWS Temp 0  MEWS Systolic 2  MEWS Pulse 0  MEWS RR 0  MEWS LOC 0  MEWS Score 2  MEWS Score Color Yellow  Assess: if the MEWS score is Yellow or Red  Were vital signs taken at a resting state? Yes  Focused Assessment Change from prior assessment (see assessment flowsheet)  Early Detection of Sepsis Score *See Row Information* Low  MEWS guidelines implemented *See Row Information* Yes  Treat  MEWS Interventions Escalated (See documentation below)  Pain Scale 0-10  Pain Score 10  Take Vital Signs  Increase Vital Sign Frequency  Yellow: Q 2hr X 2 then Q 4hr X 2, if remains yellow, continue Q 4hrs  Escalate  MEWS: Escalate Yellow: discuss with charge nurse/RN and consider discussing with provider and RRT  Notify: Charge Nurse/RN  Name of Charge Nurse/RN Notified Jayden RN   Date Charge Nurse/RN Notified 08/09/20  Time Charge Nurse/RN Notified 2902  Notify: Provider  Provider Name/Title Aline August MD  Date Provider Notified 08/09/20  Time Provider Notified 0825  Notification Type Page  Notification Reason Change in status  Response Other (Comment) (give scheduled BP meds early )  Date of Provider Response 08/09/20  Time of Provider Response 0826  Notify: Rapid Response  Name of Rapid Response RN Notified Christian RN   Date Rapid Response Notified 08/09/20  Time Rapid Response Notified 0802  Document  Patient Outcome Other (Comment) (remains confused)  Progress note created (see row info) Yes

## 2020-08-11 ENCOUNTER — Inpatient Hospital Stay (HOSPITAL_COMMUNITY): Payer: Medicare Other

## 2020-08-11 LAB — COMPREHENSIVE METABOLIC PANEL
ALT: 33 U/L (ref 0–44)
AST: 17 U/L (ref 15–41)
Albumin: 2.9 g/dL — ABNORMAL LOW (ref 3.5–5.0)
Alkaline Phosphatase: 195 U/L — ABNORMAL HIGH (ref 38–126)
Anion gap: 9 (ref 5–15)
BUN: 21 mg/dL — ABNORMAL HIGH (ref 6–20)
CO2: 21 mmol/L — ABNORMAL LOW (ref 22–32)
Calcium: 10.4 mg/dL — ABNORMAL HIGH (ref 8.9–10.3)
Chloride: 109 mmol/L (ref 98–111)
Creatinine, Ser: 1.22 mg/dL — ABNORMAL HIGH (ref 0.44–1.00)
GFR calc Af Amer: 56 mL/min — ABNORMAL LOW (ref >60)
GFR calc non Af Amer: 48 mL/min — ABNORMAL LOW (ref >60)
Glucose, Bld: 116 mg/dL — ABNORMAL HIGH (ref 70–99)
Potassium: 3.8 mmol/L (ref 3.5–5.1)
Sodium: 139 mmol/L (ref 135–145)
Total Bilirubin: 0.5 mg/dL (ref 0.3–1.2)
Total Protein: 7.3 g/dL (ref 6.5–8.1)

## 2020-08-11 LAB — PHOSPHORUS: Phosphorus: 3.4 mg/dL (ref 2.5–4.6)

## 2020-08-11 LAB — GLUCOSE, CAPILLARY
Glucose-Capillary: 120 mg/dL — ABNORMAL HIGH (ref 70–99)
Glucose-Capillary: 110 mg/dL — ABNORMAL HIGH (ref 70–99)

## 2020-08-11 LAB — MAGNESIUM: Magnesium: 1.7 mg/dL (ref 1.7–2.4)

## 2020-08-11 MED ORDER — PROBIOTIC-10 PO CHEW: CHEWABLE_TABLET | Freq: Every day | ORAL | Status: DC

## 2020-08-11 MED ORDER — TRAVASOL 10 % IV SOLN
INTRAVENOUS | Status: AC
  Filled 2020-08-11: qty 998.4

## 2020-08-11 MED ORDER — FAMOTIDINE 20 MG PO TABS
20.0000 mg | ORAL_TABLET | Freq: Every day | ORAL | Status: DC
  Administered 2020-08-11: 20 mg via ORAL
  Filled 2020-08-11: qty 1

## 2020-08-11 MED ORDER — MULTIVITAMIN ADULT PO TABS: ORAL_TABLET | Freq: Every day | ORAL | Status: DC

## 2020-08-11 MED ORDER — CALCIUM CARBONATE ANTACID 500 MG PO CHEW: 1.0000 | CHEWABLE_TABLET | Freq: Three times a day (TID) | ORAL | Status: DC | PRN

## 2020-08-11 MED ORDER — HYDRALAZINE HCL 50 MG PO TABS
50.0000 mg | ORAL_TABLET | Freq: Three times a day (TID) | ORAL | Status: DC
  Administered 2020-08-12 (×2): 50 mg via ORAL
  Filled 2020-08-11 (×2): qty 1

## 2020-08-11 MED ORDER — INSULIN ASPART 100 UNIT/ML ~~LOC~~ SOLN: 0.0000 [IU] | SUBCUTANEOUS | Status: DC

## 2020-08-11 MED ORDER — RISAQUAD PO CAPS
1.0000 | ORAL_CAPSULE | Freq: Every day | ORAL | Status: DC
  Administered 2020-08-12: 1 via ORAL
  Filled 2020-08-11: qty 1

## 2020-08-11 MED ORDER — DICYCLOMINE HCL 20 MG PO TABS
20.0000 mg | ORAL_TABLET | Freq: Three times a day (TID) | ORAL | Status: DC
  Administered 2020-08-11 – 2020-08-12 (×3): 20 mg via ORAL
  Filled 2020-08-11 (×3): qty 1

## 2020-08-11 NOTE — Progress Notes (Addendum)
PROGRESS NOTE    Caitlin Peters  PZW:[id number] DOB: 10-24-1961 DOA: 08/02/2020 PCP: [name], [name], MD    Chief Complaint  Patient presents with  . Fever    Brief Narrative:  59 y.o.femalewith medical history significant ofchronic diarrhea, hypertension, CKD stage III, chronic anemia, Crohn's disease, short gut syndrome/TPN dependent, chronic pain on methadone, malnutrition, anxiety, depression, tobacco use, recent hospital admission for Enterobacter and stenotrophomonas sepsis in the setting of long-term PICC line in the left thigh which was treated with IV antibiotics and subsequently oral Bactrim on discharge till 08/02/2020 presented on 08/02/2020 with fever.  On presentation, she was febrile with temperature of 100.8 along with tachycardia with hypotension along with leukocytosis of 18.5 creatinine of 2.5.  COVID-19 testing was negative.  Chest x-ray was negative for infiltrates.  CT of the abdomen and pelvis did not show any obvious infectious source.  She was started on broad-spectrum antibiotics and IV fluids.  She was found to have Enterobacter bacteremia.  ID was consulted.  Subsequently IR was also consulted.   Assessment & Plan:   Principal Problem:   Sepsis ([place]) Active Problems:   Anemia   AKI (acute kidney injury) ([place])   Crohn disease ([place])   Chronic pain syndrome   Central line infection   Enterobacter sepsis ([name])   Bacteremia   Acute metabolic encephalopathy   Hypomagnesemia   Partial small bowel obstruction ([name])   Abnormal CT of the abdomen  1 sepsis secondary to Enterobacter bacteremia Patient noted to have recently been treated for stenotrophomonas and Enterobacter bacteremia most likely related to her chronic femoral PICC line.  Patient during prior hospitalization received IV antibiotics discharged on oral Bactrim till 08/02/2020 and subsequently presented to the ED with fevers on last day of oral antibiotics.  Patient initially placed on IV  cefepime and subsequently IV Cipro for Enterobacter bacteremia.  Patient seen by IR status post removal of existing left common femoral tunneled central venous catheter and placement of right common femoral vein tunneled central venous catheter 19 2021 per Dr. Pascal Lux IR.  Patient status post 7 days IV antibiotics.  ID was following and recommended no further antibiotics on discharge.  Repeat blood cultures from 08/07/2020 with no growth to date.  TPN resumed.  ID signed off 08/08/2020.  2.  Acute metabolic encephalopathy/confusion Questionable etiology.  Could likely have been secondary to opioid pain medication.  Patient noted to have been receiving IV Dilaudid for pain control and noted to be confused and oriented to self only on 08/09/2020.  IV Dilaudid discontinued.  Head CT, MRI brain done negative for any acute abnormalities.  Patient started on gentle hydration.  No other signs or symptoms of infection.  Patient improving clinically.  No further work-up needed at this time.  Follow.  3.  Acute kidney injury on chronic kidney disease stage IIIb/anion gap metabolic acidosis Secondary to prerenal azotemia from dehydration/sepsis.  Creatinine on admission was 2.5.  Baseline creatinine 1.6-1.7.  No evidence of obstructive uropathy on CT abdomen and pelvis.  Acidosis resolved.  Bicarb drip discontinued.  IV fluids decreased as patient started back on TPN.  Will discontinue IV fluids.  Creatinine down to 1.22.  Follow.   4.  Hypokalemia Patient on TPN.  Electrolytes being repleted per pharmacy.  5.  Hypomagnesemia On TPN.  Electrolytes being repleted by pharmacy.  6.  Anemia of chronic disease Stable.  No signs of bleeding.  Hemoglobin currently at 8.3.  Transfusion threshold hemoglobin <7.  7.  Partial small bowel obstruction Noted on CT abdomen and pelvis that patient has been complaining of some abdominal distention.  CT done with a transition zone in the region of the distal ileum.  Patient does  state had flatus and bowel movement yesterday.  Patient with history of Crohn's status post multiple surgeries.  General surgery consulted and do not feel any indication for emergency surgery at this time.  Patient started on clears which she is tolerated.  Advance to full liquid diet and if tolerates full liquid diet may advance to a soft diet.  General surgery following and appreciate input and recommendations.  Follow.  8.  Abnormal CT abdomen and pelvis CT abdomen and pelvis showing 2 areas of luminal narrowing with soft tissue thickening in the colon at the junction of descending and sigmoid colon in the mid sigmoid colon.  Areas somewhat apple core type appearance raise concern for foci of colonic carcinoma.  Patient with history of Crohn's on TPN.  Patient with multiple surgeries.  Consulted GI, spoke with Dr. Collene Mares who based on general surgical evaluation, patient having bowel movements and patient's complicated GI history recommended no further GI work-up in-house with close outpatient follow-up with patient's gastroenterologist.   9.  Thrombocytopenia Resolved.  10.  Crohn's disease/short gut syndrome Continue budesonide.  TPN resumed and being managed by pharmacy.  11.  Chronic pain syndrome Continue home dose scheduled methadone.  IV Dilaudid discontinued due to confusion.  Supportive care.  12.  Hypertension Blood pressure elevated.  Continue home regimen amlodipine and metoprolol.  Resume home regimen hydralazine.  Follow.    DVT prophylaxis: Heparin Code Status: Full Family Communication: Updated patient.  Updated daughter Ms. Clotilde Dieter via telephone.  Disposition:   Status is: Inpatient    Dispo: The patient is from: Home              Anticipated d/c is to: Likely home              Anticipated d/c date is: Hopefully tomorrow              Patient currently with concerns for partial small bowel obstruction, abnormal CT abdomen and pelvis, some confusion, not stable for  discharge.       Consultants:   General surgery: Dr. Nadeen Landau 08/10/2020  ID: Dr. Tommy Medal 08/04/2020    Procedures:   MRI brain 08/10/2020  CT abdomen and pelvis 08/09/2020, 08/02/2020  CT head 08/09/2020  Abdominal films 08/08/2020, 08/07/2020  Chest x-ray 08/02/2020  Successful placement of dual-lumen tunneled central venous catheter via right common femoral vein with tip terminating in superior caval atrial junction/successful removal of existing left approach tunneled central venous catheter per IR, Dr. Pascal Lux 19 2021  Antimicrobials:   IV cefepime 08/03/2020>>>> 08/09/2020   Subjective: Patient feels confusion has improved.  States mid abdominal pain back to her usual chronic pain.  Passing gas.  Had bowel movement yesterday.  Tolerated clear liquids.  Diet being advanced to a full liquid diet.   Objective: Vitals:   08/10/20 1010 08/10/20 1401 08/10/20 2150 08/11/20 0557  BP: (!) 176/74 (!) 158/79 (!) 154/71 (!) 164/80  Pulse: 69 (!) 55 (!) 56 (!) 108  Resp: 15 16 17 16   Temp: 97.7 F (36.5 C) 98.9 F (37.2 C) 98.5 F (36.9 C) 98.5 F (36.9 C)  TempSrc: Oral Oral Oral Oral  SpO2: 100% 96% 100% 100%  Weight:      Height:  Intake/Output Summary (Last 24 hours) at 08/11/2020 1210 Last data filed at 08/11/2020 1059 Gross per 24 hour  Intake 1089.17 ml  Output 500 ml  Net 589.17 ml   Filed Weights   08/08/20 0743  Weight: 66 kg    Examination:  General exam: NAD Respiratory system: Lungs clear to auscultation bilaterally.  No wheezes, no crackles, no rhonchi.  Normal respiratory effort.   Cardiovascular system: Regular rate rhythm no murmurs rubs or gallops.  No JVD.  No lower extremity edema.   Gastrointestinal system: Abdomen is soft, nondistended, positive bowel sounds, some tenderness to palpation mid abdominal region, no rebound, no guarding.  Central nervous system: Alert and oriented to self place and time.  Moving extremities spontaneously.   No focal neurological deficits.   Extremities: Symmetric 5 x 5 power. Skin: No rashes, lesions or ulcers Psychiatry: Judgement and insight appear fair. Mood & affect appropriate.     Data Reviewed: I have personally reviewed following labs and imaging studies  CBC: Recent Labs  Lab 08/05/20 1605 08/06/20 0551 08/08/20 0525 08/09/20 0610 08/10/20 0545  WBC 3.4* 3.5* 4.2 5.6 4.1  NEUTROABS 1.7 1.6* 1.9 2.7 2.1  HGB 7.7* 7.8* 8.6* 8.4* 8.3*  HCT 23.7* 24.0* 26.4* 25.7* 25.6*  MCV 93.7 92.3 92.0 92.8 94.5  PLT 147* 152 177 193 742    Basic Metabolic Panel: Recent Labs  Lab 08/08/20 0525 08/09/20 0610 08/10/20 0545 08/10/20 0800 08/11/20 0654  NA 136 136 SPECIMEN CONTAMINATED, UNABLE TO PERFORM TEST(S). 138 139  K 3.6 3.4* SPECIMEN CONTAMINATED, UNABLE TO PERFORM TEST(S). 3.6 3.8  CL 104 103 SPECIMEN CONTAMINATED, UNABLE TO PERFORM TEST(S). 109 109  CO2 25 22 SPECIMEN CONTAMINATED, UNABLE TO PERFORM TEST(S). 21* 21*  GLUCOSE 102* 102* SPECIMEN CONTAMINATED, UNABLE TO PERFORM TEST(S). 124* 116*  BUN 15 16 SPECIMEN CONTAMINATED, UNABLE TO PERFORM TEST(S). 13 21*  CREATININE 1.36* 1.68* SPECIMEN CONTAMINATED, UNABLE TO PERFORM TEST(S). 1.40* 1.22*  CALCIUM 9.6 9.9 SPECIMEN CONTAMINATED, UNABLE TO PERFORM TEST(S). 10.0 10.4*  MG 1.9 1.8 SPECIMEN CONTAMINATED, UNABLE TO PERFORM TEST(S). 1.8 1.7  PHOS  --  3.2 SPECIMEN CONTAMINATED, UNABLE TO PERFORM TEST(S). 2.5 3.4    GFR: Estimated Creatinine Clearance: 50.1 mL/min (A) (by C-G formula based on SCr of 1.22 mg/dL (H)).  Liver Function Tests: Recent Labs  Lab 08/05/20 0605 08/10/20 0545 08/10/20 0800 08/11/20 0654  AST 33 SPECIMEN CONTAMINATED, UNABLE TO PERFORM TEST(S). 23 17  ALT 46* SPECIMEN CONTAMINATED, UNABLE TO PERFORM TEST(S). 40 33  ALKPHOS 238* SPECIMEN CONTAMINATED, UNABLE TO PERFORM TEST(S). 203* 195*  BILITOT 0.8 SPECIMEN CONTAMINATED, UNABLE TO PERFORM TEST(S). 0.9 0.5  PROT 6.6 SPECIMEN CONTAMINATED,  UNABLE TO PERFORM TEST(S). 7.1 7.3  ALBUMIN 2.8* SPECIMEN CONTAMINATED, UNABLE TO PERFORM TEST(S). 2.9* 2.9*    CBG: Recent Labs  Lab 08/10/20 0439 08/10/20 0730 08/10/20 1352 08/10/20 2153 08/11/20 0555  GLUCAP 121* 121* 122* 124* 120*     Recent Results (from the past 240 hour(s))  Blood Culture (routine x 2)     Status: Abnormal   Collection Time: 08/02/20  7:26 PM   Specimen: BLOOD  Result Value Ref Range Status   Specimen Description   Final    BLOOD BLOOD RIGHT ARM Performed at [facility], [place] [address], [place], [place] [address]    Special Requests   Final    BOTTLES DRAWN AEROBIC AND ANAEROBIC Blood Culture results may not be optimal due to an excessive volume of blood received in culture  bottles Performed at [facility] [address], [place], [place] [address]    Culture  Setup Time   Final    GRAM NEGATIVE RODS IN BOTH AEROBIC AND ANAEROBIC BOTTLES CRITICAL VALUE NOTED.  VALUE IS CONSISTENT WITH PREVIOUSLY REPORTED AND CALLED VALUE.    Culture (A)  Final    ENTEROBACTER CLOACAE SUSCEPTIBILITIES PERFORMED ON PREVIOUS CULTURE WITHIN THE LAST 5 DAYS. Performed at [facility], [place] [address] [place], [place] [address]    Report Status 08/05/2020 FINAL  Final  Urine culture     Status: None   Collection Time: 08/02/20  7:26 PM   Specimen: In/Out Cath Urine  Result Value Ref Range Status   Specimen Description   Final    IN/OUT CATH URINE Performed at [facility] [address], [place], [place] [address]    Special Requests   Final    NONE Performed at [facility], [place] [address] [place], [place] [address]    Culture   Final    NO GROWTH Performed at [facility], [place] [address] [place], [place] [address]    Report Status 08/04/2020 FINAL  Final  Blood Culture (routine x 2)     Status: Abnormal   Collection Time: 08/02/20  7:31 PM   Specimen: BLOOD    Result Value Ref Range Status   Specimen Description   Final    BLOOD FEM LINE Performed at [facility] [address], [place], [place] [address]    Special Requests   Final    BOTTLES DRAWN AEROBIC AND ANAEROBIC Blood Culture adequate volume Performed at [facility] [address], [place], [place] [address]    Culture  Setup Time   Final    GRAM NEGATIVE RODS IN BOTH AEROBIC AND ANAEROBIC BOTTLES Organism ID to follow CRITICAL RESULT CALLED TO, READ BACK BY AND VERIFIED WITH: J. LEGGE PHARMD, AT 1300 08/03/20 BY D. VANHOOK Performed at [facility], [place] [address] [place],  [address]    Culture ENTEROBACTER CLOACAE (A)  Final   Report Status 08/05/2020 FINAL  Final   Organism ID, Bacteria ENTEROBACTER CLOACAE  Final      Susceptibility   Enterobacter cloacae - MIC*    CEFAZOLIN >=64 RESISTANT Resistant     CEFEPIME <=0.12 SENSITIVE Sensitive     CEFTAZIDIME <=1 SENSITIVE Sensitive     CIPROFLOXACIN 0.5 SENSITIVE Sensitive     GENTAMICIN <=1 SENSITIVE Sensitive     IMIPENEM <=0.25 SENSITIVE Sensitive     TRIMETH/SULFA >=320 RESISTANT Resistant     PIP/TAZO <=4 SENSITIVE Sensitive     * ENTEROBACTER CLOACAE  Blood Culture ID Panel (Reflexed)     Status: Abnormal   Collection Time: 08/02/20  7:31 PM  Result Value Ref Range Status   Enterococcus faecalis NOT DETECTED NOT DETECTED Final   Enterococcus Faecium NOT DETECTED NOT DETECTED Final   Listeria monocytogenes NOT DETECTED NOT DETECTED Final   Staphylococcus species NOT DETECTED NOT DETECTED Final   Staphylococcus aureus (BCID) NOT DETECTED NOT DETECTED Final   Staphylococcus epidermidis NOT DETECTED NOT DETECTED Final   Staphylococcus lugdunensis NOT DETECTED NOT DETECTED Final   Streptococcus species NOT DETECTED NOT DETECTED Final   Streptococcus agalactiae NOT DETECTED NOT DETECTED Final   Streptococcus pneumoniae NOT DETECTED NOT DETECTED Final   Streptococcus pyogenes NOT  DETECTED NOT DETECTED Final   A.calcoaceticus-baumannii NOT DETECTED NOT DETECTED Final  Bacteroides fragilis NOT DETECTED NOT DETECTED Final   Enterobacterales DETECTED (A) NOT DETECTED Final    Comment: Enterobacterales represent a large order of gram negative bacteria, not a single organism. CRITICAL RESULT CALLED TO, READ BACK BY AND VERIFIED WITH: J. LEGGE PHARMD, AT 1300 08/03/20 BY D. VANHOOK    Enterobacter cloacae complex DETECTED (A) NOT DETECTED Final    Comment: CRITICAL RESULT CALLED TO, READ BACK BY AND VERIFIED WITH: J. LEGGE PHARMD, AT 1300 08/03/20 BY D. VANHOOK    Escherichia coli NOT DETECTED NOT DETECTED Final   Klebsiella aerogenes NOT DETECTED NOT DETECTED Final   Klebsiella oxytoca NOT DETECTED NOT DETECTED Final   Klebsiella pneumoniae NOT DETECTED NOT DETECTED Final   Proteus species NOT DETECTED NOT DETECTED Final   Salmonella species NOT DETECTED NOT DETECTED Final   Serratia marcescens NOT DETECTED NOT DETECTED Final   Haemophilus influenzae NOT DETECTED NOT DETECTED Final   Neisseria meningitidis NOT DETECTED NOT DETECTED Final   Pseudomonas aeruginosa NOT DETECTED NOT DETECTED Final   Stenotrophomonas maltophilia NOT DETECTED NOT DETECTED Final   Candida albicans NOT DETECTED NOT DETECTED Final   Candida auris NOT DETECTED NOT DETECTED Final   Candida glabrata NOT DETECTED NOT DETECTED Final   Candida krusei NOT DETECTED NOT DETECTED Final   Candida parapsilosis NOT DETECTED NOT DETECTED Final   Candida tropicalis NOT DETECTED NOT DETECTED Final   Cryptococcus neoformans/gattii NOT DETECTED NOT DETECTED Final   CTX-M ESBL NOT DETECTED NOT DETECTED Final   Carbapenem resistance IMP NOT DETECTED NOT DETECTED Final   Carbapenem resistance KPC NOT DETECTED NOT DETECTED Final   Carbapenem resistance NDM NOT DETECTED NOT DETECTED Final   Carbapenem resist OXA 48 LIKE NOT DETECTED NOT DETECTED Final   Carbapenem resistance VIM NOT DETECTED NOT DETECTED Final     Comment: Performed at [facility], [address], [place], [place] [address]  SARS Coronavirus 2 by RT PCR (hospital order, performed in [facility] hospital lab) Nasopharyngeal Nasopharyngeal Swab     Status: None   Collection Time: 08/02/20 10:40 PM   Specimen: Nasopharyngeal Swab  Result Value Ref Range Status   SARS Coronavirus 2 NEGATIVE NEGATIVE Final    Comment: (NOTE) SARS-CoV-2 target nucleic acids are NOT DETECTED.  The SARS-CoV-2 RNA is generally detectable in upper and lower respiratory specimens during the acute phase of infection. The lowest concentration of SARS-CoV-2 viral copies this assay can detect is 250 copies / mL. A negative result does not preclude SARS-CoV-2 infection and should not be used as the sole basis for treatment or other patient management decisions.  A negative result may occur with improper specimen collection / handling, submission of specimen other than nasopharyngeal swab, presence of viral mutation(s) within the areas targeted by this assay, and inadequate number of viral copies (<250 copies / mL). A negative result must be combined with clinical observations, patient history, and epidemiological information.  Fact Sheet for Patients:   StrictlyIdeas.no  Fact Sheet for Healthcare Providers: BankingDealers.co.za  This test is not yet approved or  cleared by the [place] FDA and has been authorized for detection and/or diagnosis of SARS-CoV-2 by FDA under an Emergency Use Authorization (EUA).  This EUA will remain in effect (meaning this test can be used) for the duration of the COVID-19 declaration under Section 564(b)(1) of the Act, 21 U.S.C. section 360bbb-3(b)(1), unless the authorization is terminated or revoked sooner.  Performed at [facility], [place] [address] [place],  [place] [address]   Culture, blood (routine x 2)     Status: None (Preliminary result)     Collection Time: 08/07/20  7:29 AM   Specimen: BLOOD  Result Value Ref Range Status   Specimen Description   Final    BLOOD BLOOD RIGHT HAND Performed at [facility] [address], [place], [place] [address]    Special Requests   Final    BOTTLES DRAWN AEROBIC ONLY Blood Culture adequate volume Performed at [facility] [address], [place], [place] [address]    Culture   Final    NO GROWTH 3 DAYS Performed at [facility], [place] [address], [place], [place] [address]    Report Status PENDING  Incomplete  Culture, blood (routine x 2)     Status: None (Preliminary result)   Collection Time: 08/07/20  7:29 AM   Specimen: BLOOD  Result Value Ref Range Status   Specimen Description   Final    BLOOD BLOOD RIGHT HAND Performed at [facility] [address], [place], [place] [address]    Special Requests   Final    BOTTLES DRAWN AEROBIC ONLY Blood Culture adequate volume Performed at [facility] [address] [place], [place] [address]    Culture   Final    NO GROWTH 3 DAYS Performed at [facility], [place] [address], [place],  [address]    Report Status PENDING  Incomplete         Radiology Studies: CT HEAD WO CONTRAST  Result Date: 08/09/2020 CLINICAL DATA:  [place] EXAM: CT HEAD WITHOUT CONTRAST TECHNIQUE: Contiguous axial images were obtained from the base of the skull through the vertex without intravenous contrast. COMPARISON:  None. FINDINGS: Brain: No evidence of acute infarction, hemorrhage, hydrocephalus, extra-axial collection or mass lesion/mass effect. Mild patchy white matter hypodensity bilaterally most likely chronic microvascular ischemia. Vascular: Negative for hyperdense vessel Skull: Negative Sinuses/Orbits: Negative Other: None IMPRESSION: No acute abnormality. Mild chronic microvascular ischemic changes in the white matter. Electronically Signed   By:  Franchot Gallo M.D.   On: 08/09/2020 12:49   MR BRAIN WO CONTRAST  Result Date: 08/10/2020 CLINICAL DATA:  Delirium. Hypertension, stage 3 chronic kidney disease. Crohn's disease. Smoker. EXAM: MRI HEAD WITHOUT CONTRAST TECHNIQUE: Multiplanar, multiecho pulse sequences of the brain and surrounding structures were obtained without intravenous contrast. COMPARISON:  CT head 08/09/2020 FINDINGS: Brain: Ventricle size and cerebral volume normal. Negative for acute infarct Periventricular deep white matter hyperintensities. Given history, this is most likely chronic microvascular ischemia. Negative for hemorrhage or mass. Ossification of the tentorium on the right as noted on CT. Vascular: Normal arterial flow voids. Skull and upper cervical spine: Negative Sinuses/Orbits: Paranasal sinuses clear. Negative orbit. Right mastoid effusion. Other: None IMPRESSION: Negative for acute infarct Bilateral white matter changes most likely chronic microvascular ischemia given the clinical history and risk factors. Electronically Signed   By: Franchot Gallo M.D.   On: 08/10/2020 11:49   CT ABDOMEN PELVIS W CONTRAST  Result Date: 08/09/2020 CLINICAL DATA:  Abdominal pain EXAM: CT ABDOMEN AND PELVIS WITH CONTRAST TECHNIQUE: Multidetector CT imaging of the abdomen and pelvis was performed using the standard protocol following bolus administration of intravenous contrast. Oral contrast also administered. CONTRAST:  75m OMNIPAQUE IOHEXOL 300 MG/ML  SOLN COMPARISON:  August 02, 2020 FINDINGS: Lower chest: Lung bases are clear. Hepatobiliary: No focal liver lesions are appreciable. Gallbladder is absent. There is air  within the biliary ductal system, likely secondary to previous sphincterotomy. No portal venous air evident. No appreciable biliary duct dilatation. Pancreas: There is no evident pancreatic mass or inflammatory focus. Note that there is pancreatic duct dilatation without pancreatic duct obstructing lesion. Spleen:  No splenic lesions are evident. Adrenals/Urinary Tract: Adrenals bilaterally appear normal. Kidneys bilaterally show no evident mass or hydronephrosis on either side. No renal or ureteral calculus. Urinary bladder is midline with urinary bladder wall thickness normal. Stomach/Bowel: There is food material throughout the stomach. There are loops of dilated small bowel with a relative transition zone in the mid to distal ileum consistent with a degree of bowel obstruction. Oral contrast passes through this region, indicating incomplete bowel obstruction. Postoperative change near the terminal ileum is noted. There is dilatation of colon throughout much of its course with fluid throughout much of the colon. There is an apparent soft tissue mass near the junction of the descending colon and sigmoid colon extending over 2.5 cm with a suggested apple-core type appearance in this region. This lesion is best appreciated on coronal slice 25 series 5 but is also appreciated on axial slices 54 and 55 series 2. A smaller similar appearance in the mid sigmoid colon extends over a distance of 1.2 cm. This more distal focus of apple-core type appearance is seen best on axial slices 69 and 70 series 2. elsewhere there is fluid in the colon which suggests that there may be a degree of underlying colitis. There is no free air evident. Vascular/Lymphatic: There is no abdominal aortic aneurysm. There are foci of aortic atherosclerosis. Major venous structures appear patent. Note that there is a right femoral venous catheter with the tip in a branch of the hepatic vein on the right. No adenopathy is evident in the abdomen or pelvis. Reproductive: Uterus absent.  No adnexal masses. Other: Suspect previous appendiceal removal. No inflammation in the periappendiceal region evident. Musculoskeletal: There is degenerative change in the lumbar spine. No blastic or lytic bone lesions. There is sclerosis in the left iliac bone and in the left  femoral head. 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 No intramuscular lesions are evident. IMPRESSION: 1. There are 2 areas of luminal narrowing with soft tissue thickening in the colon, located at the junction of the descending and sigmoid colon in the mid sigmoid colon. These areas have a somewhat apple-core type appearance in raise concern for foci of colonic carcinoma. Direct visualization of these areas may well be warranted. Fluid throughout much of the colon may indicate a degree of underlying colitis. 2. There is a transition zone in the region of the distal ileum which may indicate a degree of partial bowel obstruction. Contrast flows through this area into the ascending colon. There are loops of dilated small bowel throughout much of the small bowel. There is no free air. 3. Air within the biliary ductal system is likely secondary to previous sphincterotomy. 4. Right femoral catheter has its tip in a branch of the right hepatic vein. 4. Pancreatic duct dilatation which may be indicative of a degree of pancreatitis. No pancreatic mass or pancreatic duct obstructing lesion evident. 5.  Gallbladder and uterus absent. 6.  Aortic Atherosclerosis (ICD10-I70.0). These results will be called to the ordering clinician or representative by the Radiologist Assistant, and communication documented in the PACS or [employer]. Electronically Signed   By: Lowella Grip III M.D.   On: 08/09/2020 17:03   DG Abd 2 Views  Result Date: 08/11/2020 CLINICAL DATA:  Small bowel obstruction. EXAM: X-RAY ABDOMEN 3 VIEWS COMPARISON:  August 08, 2020. FINDINGS: Postsurgical changes are seen in the right side of the abdomen. Stable air-filled distended colon is noted. No definite small bowel dilatation is noted. Status post cholecystectomy. No free air is noted on decubitus views. IMPRESSION: Stable air-filled distended colon is noted. No definite small bowel dilatation is noted. Electronically  Signed   By: Marijo Conception M.D.   On: 08/11/2020 10:23        Scheduled Meds: . amLODipine  10 mg Oral Daily  . budesonide  3 mg Oral Daily  . buPROPion  200 mg Oral Daily  . Chlorhexidine Gluconate Cloth  6 each Topical Daily  . cholecalciferol  1,000 Units Oral Daily  . cycloSPORINE  1 drop Both Eyes BID  . DULoxetine  90 mg Oral Daily  . estradiol  2 mg Oral Daily  . heparin  5,000 Units Subcutaneous Q8H  . insulin aspart  0-9 Units Subcutaneous 4 times per day  . methadone  5 mg Oral 5 X Daily  . metoprolol tartrate  100 mg Oral BID  . omega-3 acid ethyl esters  1,000 mg Oral Daily  . pantoprazole  40 mg Oral Daily  . senna-docusate  1 tablet Oral BID  . sucralfate  1 g Oral TID WC & HS  . vitamin B-12  1,000 mcg Oral Daily   Continuous Infusions: . sodium chloride Stopped (08/09/20 1735)  . dextrose 5 % and 0.9 % NaCl with KCl 40 mEq/L 10 mL/hr at 08/11/20 0300  . TPN ADULT (ION) 80 mL/hr at 08/11/20 0300  . TPN CYCLIC-ADULT (ION)       LOS: 9 days    Time spent: 40 minutes    Irine Seal, MD [facility]   To contact the attending provider between 7A-7P or the covering provider during after hours 7P-7A, please log into the web site [email or web address] and access using universal [place] password for that web site. If you do not have the password, please call the hospital operator.  08/11/2020, 12:10 PM

## 2020-08-11 NOTE — Progress Notes (Signed)
VAST RN notified by pharmacy staff that TPN mistakenly sent to [facility]. VAST RN notified unit RN of issue. VAST RN verbalized to unit RN that current TPN rate could be decreased to extend infusion. VAST RN will be to pt's bedside to hang new TPN once it is delivered.

## 2020-08-11 NOTE — Progress Notes (Signed)
Follow up call made to pharmacy regarding availability of ordered TPN infusion. TPN remains in route to hospital. Securechat sent to RN recommending to consider hanging D10 if/when currently hanging TPN bag is empty.

## 2020-08-11 NOTE — Progress Notes (Signed)
CC:  PSBO  Subjective: Pt doing well, she reports 2-3 BM's yesterday and one this AM.  She is still a little nauseated but overall much better.  Abdomen is flat, + BS, + BM.  Objective: Vital signs in last 24 hours: Temp:  [97.7 F (36.5 C)-98.9 F (37.2 C)] 98.5 F (36.9 C) (09/16 0557) Pulse Rate:  [55-108] 108 (09/16 0557) Resp:  [15-17] 16 (09/16 0557) BP: (154-176)/(71-80) 164/80 (09/16 0557) SpO2:  [96 %-100 %] 100 % (09/16 0557) Last BM Date: 08/10/20 60 PO recorded 1050 IV recorded Urine x 2 Stool x 1 recorded Afebrile, VSS Creatinine better 1.22 LFT's stable Prealbumin 29.8 (9/15)  Intake/Output from previous day: 09/15 0701 - 09/16 0700 In: 1149.2 [P.O.:60; I.V.:1089.2] Out: 200 [Urine:200] Intake/Output this shift: No intake/output data recorded.  General appearance: alert, cooperative and no distress GI: soft, non-tender; bowel sounds normal; no masses,  no organomegaly and well healed mid line incisions, some nausea this AM  Lab Results:  Recent Labs    08/09/20 0610 08/10/20 0545  WBC 5.6 4.1  HGB 8.4* 8.3*  HCT 25.7* 25.6*  PLT 193 192    BMET Recent Labs    08/10/20 0545 08/10/20 0800  NA SPECIMEN CONTAMINATED, UNABLE TO PERFORM TEST(S). 138  K SPECIMEN CONTAMINATED, UNABLE TO PERFORM TEST(S). 3.6  CL SPECIMEN CONTAMINATED, UNABLE TO PERFORM TEST(S). 109  CO2 SPECIMEN CONTAMINATED, UNABLE TO PERFORM TEST(S). 21*  GLUCOSE SPECIMEN CONTAMINATED, UNABLE TO PERFORM TEST(S). 124*  BUN SPECIMEN CONTAMINATED, UNABLE TO PERFORM TEST(S). 13  CREATININE SPECIMEN CONTAMINATED, UNABLE TO PERFORM TEST(S). 1.40*  CALCIUM SPECIMEN CONTAMINATED, UNABLE TO PERFORM TEST(S). 10.0   PT/INR No results for input(s): LABPROT, INR in the last 72 hours.  Recent Labs  Lab 08/05/20 0605 08/10/20 0545 08/10/20 0800  AST 33 SPECIMEN CONTAMINATED, UNABLE TO PERFORM TEST(S). 23  ALT 46* SPECIMEN CONTAMINATED, UNABLE TO PERFORM TEST(S). 40  ALKPHOS 238*  SPECIMEN CONTAMINATED, UNABLE TO PERFORM TEST(S). 203*  BILITOT 0.8 SPECIMEN CONTAMINATED, UNABLE TO PERFORM TEST(S). 0.9  PROT 6.6 SPECIMEN CONTAMINATED, UNABLE TO PERFORM TEST(S). 7.1  ALBUMIN 2.8* SPECIMEN CONTAMINATED, UNABLE TO PERFORM TEST(S). 2.9*     Lipase     Component Value Date/Time   LIPASE 24 08/02/2020 2020     Medications: . amLODipine  10 mg Oral Daily  . budesonide  3 mg Oral Daily  . buPROPion  200 mg Oral Daily  . Chlorhexidine Gluconate Cloth  6 each Topical Daily  . cholecalciferol  1,000 Units Oral Daily  . cycloSPORINE  1 drop Both Eyes BID  . DULoxetine  90 mg Oral Daily  . estradiol  2 mg Oral Daily  . heparin  5,000 Units Subcutaneous Q8H  . insulin aspart  0-9 Units Subcutaneous Q8H  . methadone  5 mg Oral 5 X Daily  . metoprolol tartrate  100 mg Oral BID  . omega-3 acid ethyl esters  1,000 mg Oral Daily  . pantoprazole  40 mg Oral Daily  . senna-docusate  1 tablet Oral BID  . sucralfate  1 g Oral TID WC & HS  . vitamin B-12  1,000 mcg Oral Daily   . sodium chloride Stopped (08/09/20 1735)  . dextrose 5 % and 0.9 % NaCl with KCl 40 mEq/L 10 mL/hr at 08/11/20 0300  . TPN ADULT (ION) 80 mL/hr at 08/11/20 0300    Assessment/Plan Crohn's disease on Budesonide followed by Dr. 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 Await recs. Last colonoscopy was 2019  pSBO - CT w/ transition zone in the region of the distal ileum which could indicate a partial bowel obstruction.  Contrast did flow through this area to the ascending colon.  - No indication for emergency surgery - Patient was tolerating diet and having bowel function without n/v. I think she would be okay for clears now, but will discuss with MD -  Keep K >4 and Mg> 2 for bowel function - Mobilize for bowel function - AM xray - We will follow with you  FEN - Clear liquids/IV fluids/TPN VTE - SCDs, heparin subq ID - None currently  Plan:  She is not obstructed, I will advance her to full liquids, and Medicine can advance as tolerated.  She does not need surgery.  We will be available as needed please call if we can help.        LOS: 9 days    Kodie Kishi 08/11/2020 Please see Amion

## 2020-08-11 NOTE — Progress Notes (Signed)
PHARMACY - TOTAL PARENTERAL NUTRITION CONSULT NOTE   Indication: Short bowel syndrome  Patient Measurements: Height: 5' 8"  (172.7 cm) Weight: 66 kg (145 lb 8.1 oz) IBW/kg (Calculated) : 63.9 TPN AdjBW (KG): 66 Body mass index is 22.12 kg/m.  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 TPN has been on hold since admission. Left femoral access was removed 9/10 and R femoral access placed. With repeat blood cultures negative so far, plan is to resume TPN. Cyclic TPN is managed by [facility] in the outpatient setting. Formula is in the media section of the chart. Patient is able to eat some food but absorption is unreliable with short gut syndrome per RD.   Glucose / Insulin: CBGs < 150/ 3 units SSI/24h Electrolytes: WNL except CCa: 11.3, Mg 1.7 (low end of normal despite supplementation) Renal: AKI on CKD with improving SCr LFTs / TGs: WNL/ 125 (9/15) Prealbumin / albumin: 29.8(9/15)/ 2.9 Intake / Output; MIVF: some oral intake, D5NSw/40 meq KCl @ KVO/ oliguric per I/Os (doubt charting is accurate, will discuss with RN) GI Imaging: 9/7 CT abdomen/pelvis neg Surgeries / Procedures: See central access  Central access: 9/10 IR removal of L femoral CVC and placement of R femoral CVC TPN start date: 9/14  Nutritional Goals (per RD recommendation on 9/10): kCal: 2000-2200, Protein: 100-110 g, Fluid: 2L/day Goal TPN rate is 80 mL/hr (provides 100 g of protein and 2185 kcals per day)  Current Nutrition:  TPN @ goal rate, MIVF @ [place]:  Convert to 18 hour cyclic TPN at 5449 providing 2089 kcal and 100 g protein If patient tolerates, will convert to 12 hour cyclic tomorrow consistent with PTA TPN per [facility] Remove calcium from TPN and increase magnesium and closely monitor in the setting of CKD Electrolytes in TPN: 44mq/L of Na, 584m/L of K, 0 mEq/L of Ca, 10 mEq/L of Mg, and 1579m/L of Phos. Cl:Ac ratio  1:1 Continue standard MVI and trace elements to TPN CBGs 2 h after start, 1 hour after stop, in middle of infusion, and while off infusion Continue MIVF @ KVO Monitor TPN labs on Mon/Thurs BMET, Mg, and Phos in AM  [email or web address]/16/2021,8:15 AM

## 2020-08-12 LAB — BASIC METABOLIC PANEL
Anion gap: 5 (ref 5–15)
BUN: 31 mg/dL — ABNORMAL HIGH (ref 6–20)
CO2: 21 mmol/L — ABNORMAL LOW (ref 22–32)
Calcium: 9.9 mg/dL (ref 8.9–10.3)
Chloride: 111 mmol/L (ref 98–111)
Creatinine, Ser: 1.25 mg/dL — ABNORMAL HIGH (ref 0.44–1.00)
GFR calc Af Amer: 55 mL/min — ABNORMAL LOW (ref >60)
GFR calc non Af Amer: 47 mL/min — ABNORMAL LOW (ref >60)
Glucose, Bld: 95 mg/dL (ref 70–99)
Potassium: 3.9 mmol/L (ref 3.5–5.1)
Sodium: 137 mmol/L (ref 135–145)

## 2020-08-12 LAB — CULTURE, BLOOD (ROUTINE X 2)
Culture: NO GROWTH
Culture: NO GROWTH
Special Requests: ADEQUATE
Special Requests: ADEQUATE

## 2020-08-12 LAB — GLUCOSE, CAPILLARY: Glucose-Capillary: 83 mg/dL (ref 70–99)

## 2020-08-12 LAB — PHOSPHORUS: Phosphorus: 3.4 mg/dL (ref 2.5–4.6)

## 2020-08-12 LAB — MAGNESIUM: Magnesium: 1.9 mg/dL (ref 1.7–2.4)

## 2020-08-12 MED ORDER — SENNOSIDES-DOCUSATE SODIUM 8.6-50 MG PO TABS: 1.0000 | ORAL_TABLET | Freq: Two times a day (BID) | ORAL | Status: DC

## 2020-08-12 MED ORDER — HEPARIN SOD (PORK) LOCK FLUSH 100 UNIT/ML IV SOLN
500.0000 [IU] | Freq: Once | INTRAVENOUS | Status: AC
  Administered 2020-08-12: 500 [IU] via INTRAVENOUS
  Filled 2020-08-12: qty 5

## 2020-08-12 NOTE — Progress Notes (Signed)
Pt alert and aware sitting up in bed. She states she wanted and AD paperwork.  She wanted to take home with her so she could update she form. She wanted to make some changes. Pt talked about where she was from and some of her family history. The chaplain offered caring and supportive presence, and listening ear. He provided the AD Form and explained it to her. He offered sacred music and prayers and blessings. Pt may be discharged later today.

## 2020-08-12 NOTE — Evaluation (Signed)
Physical Therapy Evaluation Patient Details Name: Caitlin Peters MRN: [id number] DOB: [birth year] Today's Date: 08/12/2020   History of Present Illness  R Schall is a 59 y.o. female with history of Crohn's disease, pancreatitis, short gut syndrome, TPN dependence, CKD-3B, chronic diarrhea, HTN, malnutrition, anxiety, depression, tobacco use and recent hospital admission for Enterobacter and stenotrophomonas sepsis in the setting of long-term PICC line. Patient admitted 9/7 with c/o fever and dx with sepsis.   Clinical Impression  Pt admitted as above but feeling much improved and able to demonstrate IND in mobility including ambulating 400' in halls sans AD and up to bathroom for toileting and hygiene unassisted.  Pt with no PT needs at this time and PT service will sign off.    Follow Up Recommendations No PT follow up    Equipment Recommendations  None recommended by PT    Recommendations for Other Services       Precautions / Restrictions Precautions Precautions: Fall Precaution Comments: pt reports ~5 falls in past 1 year, they occur when her Crohn's flares, no falls recently Restrictions Weight Bearing Restrictions: No      Mobility  Bed Mobility Overal bed mobility: Independent                Transfers Overall transfer level: Independent                  Ambulation/Gait Ambulation/Gait assistance: Min guard Gait Distance (Feet): 450 Feet Assistive device: None Gait Pattern/deviations: WFL(Within Functional Limits) Gait velocity: decr   General Gait Details: mild instability with initial movement but progressed rapidly to IND  Stairs            Wheelchair Mobility    Modified Rankin (Stroke Patients Only)       Balance Overall balance assessment: No apparent balance deficits (not formally assessed)                                           Pertinent Vitals/Pain Pain Assessment: No/denies pain    Home Living  Family/patient expects to be discharged to:: Private residence Living Arrangements: Children Available Help at Discharge: Family;Available 24 hours/day Type of Home: Apartment Home Access: Stairs to enter Entrance Stairs-Rails: Right Entrance Stairs-Number of Steps: flight Home Layout: One level Home Equipment: Walker - 2 wheels;Walker - 4 wheels      Prior Function Level of Independence: Needs assistance   Gait / Transfers Assistance Needed: Uses walker on a "bad day" - but predominantly does not need. Uses rollator when going out.  ADL's / Homemaking Assistance Needed: Reports family watches her in the shower and assists her off the commode or out of the bed if needed "on a bad day."  Comments: walks her dog     Hand Dominance   Dominant Hand: Right    Extremity/Trunk Assessment   Upper Extremity Assessment Upper Extremity Assessment: Overall WFL for tasks assessed    Lower Extremity Assessment Lower Extremity Assessment: Overall WFL for tasks assessed    Cervical / Trunk Assessment Cervical / Trunk Assessment: Normal  Communication   Communication: No difficulties  Cognition Arousal/Alertness: Awake/alert Behavior During Therapy: WFL for tasks assessed/performed Overall Cognitive Status: Within Functional Limits for tasks assessed  General Comments      Exercises     Assessment/Plan    PT Assessment Patent does not need any further PT services  PT Problem List         PT Treatment Interventions      PT Goals (Current goals can be found in the Care Plan section)  Acute Rehab PT Goals Patient Stated Goal: HOME PT Goal Formulation: All assessment and education complete, DC therapy    Frequency     Barriers to discharge        Co-evaluation               AM-PAC PT "6 Clicks" Mobility  Outcome Measure Help needed turning from your back to your side while in a flat bed without using  bedrails?: None Help needed moving from lying on your back to sitting on the side of a flat bed without using bedrails?: None Help needed moving to and from a bed to a chair (including a wheelchair)?: None Help needed standing up from a chair using your arms (e.g., wheelchair or bedside chair)?: None Help needed to walk in hospital room?: None Help needed climbing 3-5 steps with a railing? : None 6 Click Score: 24    End of Session Equipment Utilized During Treatment: Gait belt Activity Tolerance: Patient tolerated treatment well Patient left: in bed;with call bell/phone within reach Nurse Communication: Mobility status PT Visit Diagnosis: Difficulty in walking, not elsewhere classified (R26.2)    Time: 2820-8138 PT Time Calculation (min) (ACUTE ONLY): 28 min   Charges:   PT Evaluation $PT Eval Low Complexity: [place] Pager [phone] Office [phone]   Jarek Longton 08/12/2020, 4:51 PM

## 2020-08-12 NOTE — Progress Notes (Signed)
Occupational Therapy Evaluation  Patient is currently at baseline for self care and functional mobility. Patient has supportive daughter and grandchildren that assist at home as needed, otherwise patient is self sufficient. No acute OT needs identified at this time. Please re-consult if new needs arise.    08/12/20 0919  OT Visit Information  Last OT Received On 08/12/20  Assistance Needed +1  History of Present Illness Caitlin Peters is a 59 y.o. female with history of Crohn's disease, pancreatitis, short gut syndrome, TPN dependence, CKD-3B, chronic diarrhea, HTN, malnutrition, anxiety, depression, tobacco use and recent hospital admission for Enterobacter and stenotrophomonas sepsis in the setting of long-term PICC line. Patient admitted 9/7 with c/o fever and dx with sepsis.   Restrictions  Weight Bearing Restrictions No  Home Living  Family/patient expects to be discharged to: Private residence  Living Arrangements Children (DTR and grandchild)  Available Help at Discharge Family;Available 24 hours/day  Type of Home Apartment  Home Access Stairs to enter  Entrance Stairs-Number of Steps flight  Entrance Stairs-Rails Right  Home Layout One level  Bathroom Shower/Tub Tub/shower unit  Tax adviser - 2 wheels;Walker - 4 wheels  Prior Function  Level of Independence Needs assistance  Gait / Transfers Assistance Needed Uses walker on a "bad day" - but predominantly does not need. Uses rollator when going out.  ADL's / Homemaking Assistance Needed Reports family watches her in the shower and assists her off the commode or out of the bed if needed "on a bad day."  Communication  Communication No difficulties  Pain Assessment  Pain Assessment No/denies pain  Cognition  Arousal/Alertness Awake/alert  Behavior During Therapy WFL for tasks assessed/performed  Overall Cognitive Status Within Functional Limits for tasks assessed  Upper Extremity Assessment   Upper Extremity Assessment Overall WFL for tasks assessed  Lower Extremity Assessment  Lower Extremity Assessment Overall WFL for tasks assessed  Cervical / Trunk Assessment  Cervical / Trunk Assessment Normal  ADL  Overall ADL's  Modified independent  General ADL Comments patient demonstrates ability to perform LB dressing, sinkside hygiene and toileting tasks without physical assistance  Bed Mobility  Overal bed mobility Independent  Transfers  Overall transfer level Independent  Balance  Overall balance assessment No apparent balance deficits (not formally assessed)  OT - End of Session  Equipment Utilized During Treatment Rolling walker  Activity Tolerance Patient tolerated treatment well  Patient left in chair;with call bell/phone within reach;with chair alarm set  Nurse Communication Mobility status  OT Assessment  OT Recommendation/Assessment Patient does not need any further OT services  OT Visit Diagnosis Muscle weakness (generalized) (M62.81)  OT Problem List Decreased activity tolerance  AM-PAC OT "6 Clicks" Daily Activity Outcome Measure (Version 2)  Help from another person eating meals? 4  Help from another person taking care of personal grooming? 4  Help from another person toileting, which includes using toliet, bedpan, or urinal? 4  Help from another person bathing (including washing, rinsing, drying)? 4  Help from another person to put on and taking off regular upper body clothing? 4  Help from another person to put on and taking off regular lower body clothing? 4  6 Click Score 24  OT Recommendation  Follow Up Recommendations No OT follow up  OT Equipment Other (comment) (patient requesting cane for home)  Individuals Consulted  Consulted and Agree with Results and Recommendations Patient  OT Time Calculation  OT Start Time (ACUTE ONLY) 7353  OT Stop  Time (ACUTE ONLY) 0843  OT Time Calculation (min) 24 min  OT General Charges  $OT Visit 1 Visit  OT  Evaluation  $OT Eval Low Complexity 1 Low  Written Expression  Dominant Hand Right   Delbert Phenix OT OT pager: [phone]

## 2020-08-12 NOTE — Discharge Summary (Signed)
Physician Discharge Summary  Caitlin Peters MPN:[id number] DOB: 1961/05/28 DOA: 08/02/2020  PCP: [name], [name], MD  Admit date: 08/02/2020 Discharge date: 08/12/2020  Time spent: 60 minutes  Recommendations for Outpatient Follow-up:  1. Follow-up with Dr. Domenica Fail, gastroenterology in 1 to 2 weeks for follow-up on abnormal CT abdomen and pelvis which was concerning for areas of luminal narrowing with soft tissue thickening in the colon, concerning for apple core type appearance raising concern for foci of colonic carcinoma. 2. Follow-up with [name], [name], MD in 2 weeks.  On follow-up patient will need a basic metabolic profile done to follow-up on electrolytes and renal function.  Patient will need a CBC done to follow-up on H&H.  Patient's pain regimen also need to be reassessed as patient is oral Dilaudid was discontinued on discharge.  On follow-up will need to reassess whether oral Dilaudid may be resumed.   Discharge Diagnoses:  Principal Problem:   Sepsis ([place]) Active Problems:   Anemia   AKI (acute kidney injury) ([place])   Crohn disease ([place])   Chronic pain syndrome   Central line infection   Enterobacter sepsis ([place])   Bacteremia   Acute metabolic encephalopathy   Hypomagnesemia   Partial small bowel obstruction ([name])   Abnormal CT of the abdomen   Discharge Condition: Stable and improved  Diet recommendation: Heart healthy  Filed Weights   08/08/20 0743  Weight: 66 kg    History of present illness:  HPI per Dr. Romie Levee is a 59 y.o. female with medical history significant of chronic diarrhea, hypertension, CKD stage III, chronic anemia, Crohn's disease, short gut syndrome/TPN dependent, chronic pain on methadone, malnutrition, anxiety, depression, tobacco use, recent hospital admission for Enterobacter and stenotrophomonas sepsis in the setting of long-term PICC line in the left thigh started on Bactrim.  She presented to the ED via  EMS complaining of fever.  Patient stated she finished her course of Bactrim on day of admission and started having fevers.  Also complaining of left-sided abdominal pain and nausea.  Reported having chronic diarrhea.  Denied cough or shortness of breath.  ED Course: Febrile with temperature 100.8 F.  Tachycardic.  One isolated low blood pressure reading of 81/46 in the chart, remainder of blood pressure readings with systolic above 008.  WBC 18.5, hemoglobin 8.1, hematocrit 24.0, platelet 146k.  Sodium 135, potassium 4.3, chloride 103, bicarb 16, BUN 46, creatinine 2.5, glucose 82, anion gap 16.  LFTs chronically elevated and no significant change compared to recent labs.  Lactic acid normal x2.  INR 1.1.  Lipase normal.  SARS-CoV-2 PCR test negative.  UA with negative nitrite, small amount of leukocytes, 6-10 WBCs, and rare bacteria.  Urine culture pending.  Blood culture x2 pending.  Chest x-ray not suggestive of pneumonia.  CT abdomen pelvis not showing obvious infectious source.  Patient was given Dilaudid, Zofran, ceftriaxone, metronidazole, vancomycin, and 3 L LR boluses.  Hospital Course:  1 sepsis secondary to Enterobacter bacteremia, POA Patient noted to have recently been treated for stenotrophomonas and Enterobacter bacteremia most likely related to her chronic femoral PICC line.  Patient during prior hospitalization received IV antibiotics discharged on oral Bactrim till 08/02/2020 and subsequently presented to the ED with fevers on last day of oral antibiotics.  Patient initially placed on IV cefepime and subsequently IV Cipro for Enterobacter bacteremia.  Patient seen by IR status post removal of existing left common femoral tunneled central venous catheter and placement of right  common femoral vein tunneled central venous catheter 19 2021 per Dr. Pascal Lux IR.  Patient status post 7 days IV antibiotics.  ID was following and recommended no further antibiotics on discharge.  Repeat blood  cultures from 08/07/2020 with no growth to date.  TPN resumed.  ID signed off 08/08/2020.  Outpatient follow-up with PCP.  2.  Acute metabolic encephalopathy/confusion Questionable etiology.  Could likely have been secondary to opioid pain medication.  Patient noted to have been receiving IV Dilaudid for pain control and noted to be confused and oriented to self only on 08/09/2020.  IV Dilaudid discontinued.  Head CT, MRI brain done negative for any acute abnormalities.  Patient started on gentle hydration.  No other signs or symptoms of infection.  Patient improved clinically and was back to baseline by day of discharge.  No further outpatient work-up needed.  Follow.    3.  Acute kidney injury on chronic kidney disease stage IIIb/anion gap metabolic acidosis Secondary to prerenal azotemia from dehydration/sepsis.  Creatinine on admission was 2.5.  Baseline creatinine 1.6-1.7.  No evidence of obstructive uropathy on CT abdomen and pelvis.  Acidosis resolved on bicarb drip..  Bicarb drip discontinued.  IV fluids decreased and subsequently discontinued once patient was placed back on TPN.  Renal function improving creatinine was down to 1.25 by day of discharge.  Outpatient follow-up.   4.  Hypokalemia Patient on TPN.  Electrolytes repleted per pharmacy.  5.  Hypomagnesemia On TPN.  Electrolytes repleted by pharmacy.  6.  Anemia of chronic disease Stable.  No signs of bleeding.  Hemoglobin stabilized at 8.3.  Outpatient follow-up.    7.  Partial small bowel obstruction Noted on CT abdomen and pelvis that patient has been complaining of some abdominal distention.  CT done with a transition zone in the region of the distal ileum.  Patient does state had flatus and bowel movement yesterday.  Patient with history of Crohn's status post multiple surgeries.  General surgery consulted and do not feel any indication for emergency surgery at this time.  Patient started on clears which she tolerated and  diet advanced to a soft diet.  Patient was passing flatus and having bowel movements.  Per GI no further work-up needed at this time.  Outpatient follow-up with PCP/GI.    8.  Abnormal CT abdomen and pelvis CT abdomen and pelvis showing 2 areas of luminal narrowing with soft tissue thickening in the colon at the junction of descending and sigmoid colon in the mid sigmoid colon.  Areas somewhat apple core type appearance raise concern for foci of colonic carcinoma.  Patient with history of Crohn's on TPN.  Patient with multiple surgeries.  Consulted GI, spoke with Dr. Collene Mares who based on general surgical evaluation, patient having bowel movements and patient's complicated GI history recommended no further GI work-up in-house with close outpatient follow-up with patient's gastroenterologist, Dr. Domenica Fail.   9.  Thrombocytopenia Resolved.  10.  Crohn's disease/short gut syndrome Patient maintained on budesonide.  TPN resumed and being managed by pharmacy.  Patient be discharged home on TPN.  11.  Chronic pain syndrome Patient during the hospitalization was maintained on home scheduled dose of methadone.  IV Dilaudid was initially added however discontinued due to patient's confusion.  Patient will be discharged back on home regimen of methadone.  Patient's oral Dilaudid has been discontinued and will need outpatient follow-up to reassess need to resume oral Dilaudid.    12.  Hypertension Patient initially placed on home regimen  of amlodipine and metoprolol.  Home regimen hydralazine resumed.  Outpatient follow-up with PCP.     Procedures:  MRI brain 08/10/2020  CT abdomen and pelvis 08/09/2020, 08/02/2020  CT head 08/09/2020  Abdominal films 08/08/2020, 08/07/2020  Chest x-ray 08/02/2020  Successful placement of dual-lumen tunneled central venous catheter via right common femoral vein with tip terminating in superior caval atrial junction/successful removal of existing left approach tunneled  central venous catheter per IR, Dr. Pascal Lux 19 2021   Consultations:  General surgery: Dr. Nadeen Landau 08/10/2020  ID: Dr. Tommy Medal 08/04/2020  Discharge Exam: Vitals:   08/11/20 2118 08/12/20 1345  BP: (!) 158/69 (!) 159/93  Pulse: (!) 41 72  Resp: 18 15  Temp: 97.7 F (36.5 C) 99.4 F (37.4 C)  SpO2: 100% 100%    General: NAD Cardiovascular: RRR Respiratory: CTAB  Discharge Instructions   Discharge Instructions    Diet - low sodium heart healthy   Complete by: As directed    Discharge wound care:   Complete by: As directed    As above   Increase activity slowly   Complete by: As directed      Allergies as of 08/12/2020      Reactions   Cefepime Other (See Comments)   Neurotoxicity occurring in setting of AKI. Ceftriaxone tolerated during same admit   Gabapentin Other (See Comments)   unknown   Hyoscyamine Hives, Swelling   Legs swelling Disorientation   Lyrica [pregabalin] Other (See Comments)   unknown   Meperidine Hives   Other reaction(s): GI Upset Due to Chrones   Topamax [topiramate] Other (See Comments)   unknown   Zosyn [piperacillin Sod-tazobactam So]    Patient reports it makes her vomit, her neck stiff, and her "heart feel funny"   Fentanyl Rash   Pt is allergic to fentanyl patch related to the glue (gives her a rash) Pt states she is NOT allergic to fentanyl IV medicine   Morphine And Related Rash      Medication List    STOP taking these medications   sulfamethoxazole-trimethoprim 800-160 MG tablet Commonly known as: BACTRIM DS     TAKE these medications   acetaminophen 325 MG tablet Commonly known as: TYLENOL Take 650 mg by mouth every 6 (six) hours as needed for mild pain.   amLODipine 10 MG tablet Commonly known as: NORVASC Take 1 tablet (10 mg total) by mouth daily.   budesonide 3 MG 24 hr capsule Commonly known as: ENTOCORT EC TAKE 3 CAPSULES BY MOUTH ONCE DAILY What changed: how much to take   buPROPion 100 MG 12  hr tablet Commonly known as: WELLBUTRIN SR Take 2 tablets by mouth once daily   calcium carbonate 500 MG chewable tablet Commonly known as: TUMS - dosed in mg elemental calcium Chew 1 tablet by mouth 3 (three) times daily as needed for indigestion or heartburn.   cholecalciferol 25 MCG (1000 UNIT) tablet Commonly known as: VITAMIN D3 Take 1,000 Units by mouth daily.   cycloSPORINE 0.05 % ophthalmic emulsion Commonly known as: RESTASIS Place 1 drop into both eyes 2 (two) times daily.   Dexilant 60 MG capsule Generic drug: dexlansoprazole Take 1 capsule (60 mg total) by mouth daily.   Dextran 70-Hypromellose 0.1-0.3 % Soln Place 1 drop into both eyes 4 (four) times daily.   dicyclomine 20 MG tablet Commonly known as: BENTYL Take 20 mg by mouth 3 (three) times daily.   diphenoxylate-atropine 2.5-0.025 MG tablet Commonly known as: LOMOTIL Take  1 tablet by mouth 4 (four) times daily as needed for diarrhea or loose stools.   DULoxetine 30 MG capsule Commonly known as: CYMBALTA Take 3 capsules (90 mg total) by mouth daily.   estradiol 2 MG tablet Commonly known as: ESTRACE Take 1 tablet (2 mg total) by mouth daily.   famotidine 20 MG tablet Commonly known as: PEPCID Take 20 mg by mouth at bedtime.   hydrALAZINE 50 MG tablet Commonly known as: APRESOLINE Take 1 tablet (50 mg total) by mouth 3 (three) times daily.   HYDROmorphone 4 MG tablet Commonly known as: Dilaudid Take 1 tablet (4 mg total) by mouth every 6 (six) hours as needed for severe pain.   lipase/protease/amylase 36000 UNITS Cpep capsule Commonly known as: Creon Take 1 capsule (36,000 Units total) by mouth 3 (three) times daily as needed (with meals for digestion).   loperamide 2 MG capsule Commonly known as: IMODIUM TAKE 1 CAPSULE BY MOUTH AS NEEDED FOR DIARRHEA OR  LOOSE  STOOLS What changed:   how much to take  how to take this  when to take this  reasons to take this  additional  instructions   methadone 5 MG tablet Commonly known as: DOLOPHINE Take 1 tablet (5 mg total) by mouth 5 (five) times daily.   metoprolol tartrate 100 MG tablet Commonly known as: LOPRESSOR Take 1 tablet (100 mg total) by mouth 2 (two) times daily.   MULTIVITAMIN ADULT PO Take 1 tablet by mouth daily.   Omega-3 1000 MG Caps Take 1,000 mg by mouth daily.   PRESCRIPTION MEDICATION Inject 1 each into the vein daily. Home TPN . [facility]  . 1 bag for 16 hours. [phone]   PROBIOTIC-10 PO Take 1 capsule by mouth daily.   promethazine 25 MG tablet Commonly known as: PHENERGAN Take 1 tablet (25 mg total) by mouth every 6 (six) hours as needed for nausea or vomiting. TAKE 1 TABLET BY MOUTH EVERY 6 HOURS AS NEEDED FOR NAUSEA AND VOMITING   senna-docusate 8.6-50 MG tablet Commonly known as: Senokot-S Take 1 tablet by mouth 2 (two) times daily.   sucralfate 1 GM/10ML suspension Commonly known as: Carafate Take 10 mLs (1 g total) by mouth 4 (four) times daily -  with meals and at bedtime.   traZODone 100 MG tablet Commonly known as: DESYREL Take 2 tablets (200 mg total) by mouth at bedtime as needed for sleep.   vitamin B-12 100 MCG tablet Commonly known as: CYANOCOBALAMIN Take 1,000 mcg by mouth daily.            Durable Medical Equipment  (From admission, onward)         Start     Ordered   08/12/20 1207  For home use only DME Cane  Once        08/12/20 1206           Discharge Care Instructions  (From admission, onward)         Start     Ordered   08/12/20 0000  Discharge wound care:       Comments: As above   08/12/20 1506         Allergies  Allergen Reactions  . Cefepime Other (See Comments)    Neurotoxicity occurring in setting of AKI. Ceftriaxone tolerated during same admit  . Gabapentin Other (See Comments)    unknown  . Hyoscyamine Hives and Swelling    Legs swelling   Disorientation  .  Lyrica  [Pregabalin] Other (See Comments)    unknown  . Meperidine Hives    Other reaction(s): GI Upset Due to Chrones   . Topamax [Topiramate] Other (See Comments)    unknown  . Zosyn [Piperacillin Sod-Tazobactam So]     Patient reports it makes her vomit, her neck stiff, and her "heart feel funny"  . Fentanyl Rash    Pt is allergic to fentanyl patch related to the glue (gives her a rash) Pt states she is NOT allergic to fentanyl IV medicine  . Morphine And Related Rash    Follow-up Information    [name], [name], MD. Schedule an appointment as soon as possible for a visit in 2 week(s).   Specialty: Internal Medicine Contact information: [place] [address] [phone]        Brayton Layman, MD. Schedule an appointment as soon as possible for a visit in 1 week(s).   Specialty: Student Why: f/u in 1-2 weeks. Contact information: [place] [address] [phone]                The results of significant diagnostics from this hospitalization (including imaging, microbiology, ancillary and laboratory) are listed below for reference.    Significant Diagnostic Studies: CT ABDOMEN PELVIS WO CONTRAST  Result Date: 08/02/2020 CLINICAL DATA:  Fever EXAM: CT ABDOMEN AND PELVIS WITHOUT CONTRAST TECHNIQUE: Multidetector CT imaging of the abdomen and pelvis was performed following the standard protocol without IV contrast. COMPARISON:  CT 07/19/2020, 06/27/2020, 05/05/2020, 04/13/2020 FINDINGS: Lower chest: Lung bases demonstrate no acute consolidation or effusion. Borderline to mild cardiomegaly. Hepatobiliary: No focal hepatic abnormality. Small amount of pneumobilia, chronic finding. Status post cholecystectomy. Pancreas: No inflammatory change.  Minimal ductal prominence. Spleen: Normal in size without focal abnormality. Adrenals/Urinary Tract: Adrenal glands are unremarkable. Kidneys are normal, without renal calculi, focal lesion, or  hydronephrosis. Bladder is unremarkable. Stomach/Bowel: Contrast within the stomach and small bowel. Contrast is also reached the distal colon. No obstructive changes are identified. Evidence of prior bowel resection. No acute bowel wall thickening. Vascular/Lymphatic: Left lower extremity central venous catheter with tip in the intra hepatic IVC. No suspicious adenopathy. Reproductive: Status post hysterectomy. No adnexal masses. Other: Negative for free air or free fluid. Musculoskeletal: No acute osseous abnormality. Stable focal sclerosis in the left iliac bone. IMPRESSION: No CT evidence for acute intra-abdominal or pelvic abnormality. Evidence of prior bowel resection without evidence for obstruction or acute bowel wall thickening. Status post cholecystectomy. Trace amount of pneumobilia, chronic finding. Left lower extremity central venous catheter with tip in the intra hepatic IVC. Electronically Signed   By: Donavan Foil M.D.   On: 08/02/2020 23:00   CT ABDOMEN PELVIS WO CONTRAST  Result Date: 07/19/2020 CLINICAL DATA:  Abdominal pain and fevers EXAM: CT ABDOMEN AND PELVIS WITHOUT CONTRAST TECHNIQUE: Multidetector CT imaging of the abdomen and pelvis was performed following the standard protocol without IV contrast. COMPARISON:  06/27/2020 FINDINGS: Lower chest: No acute abnormality. Hepatobiliary: Pneumobilia is again identified. Gallbladder has been surgically removed. The liver is otherwise unremarkable. Pancreas: Stable prominence of the pancreatic duct is seen. The remainder of the pancreas is within normal limits. Spleen: Normal in size without focal abnormality. Adrenals/Urinary Tract: Adrenal glands are unremarkable. Kidneys demonstrate no renal calculi or urinary tract obstructive changes. Bladder is well distended. Stomach/Bowel: No obstructive or inflammatory changes of the colon are seen. Changes of prior colon resection are noted with patent anastomosis in the mid  abdomen. No small bowel  or gastric abnormality is seen. Vascular/Lymphatic: Mild atherosclerotic calcifications are noted. Left femoral central venous line extending into the IVC is noted. Reproductive: Status post hysterectomy. No adnexal masses. Other: No abdominal wall hernia or abnormality. No abdominopelvic ascites. Musculoskeletal: Stable sclerotic focus adjacent to the left acetabulum is noted. Degenerative changes of the lumbar spine are seen. IMPRESSION: No significant interval change from the prior exam of 06/27/2020. Chronic changes are noted. No acute abnormality to correspond with the given clinical history is noted. Electronically Signed   By: Inez Catalina M.D.   On: 07/19/2020 17:45   CT HEAD WO CONTRAST  Result Date: 08/09/2020 CLINICAL DATA:  Delirium EXAM: CT HEAD WITHOUT CONTRAST TECHNIQUE: Contiguous axial images were obtained from the base of the skull through the vertex without intravenous contrast. COMPARISON:  None. FINDINGS: Brain: No evidence of acute infarction, hemorrhage, hydrocephalus, extra-axial collection or mass lesion/mass effect. Mild patchy white matter hypodensity bilaterally most likely chronic microvascular ischemia. Vascular: Negative for hyperdense vessel Skull: Negative Sinuses/Orbits: Negative Other: None IMPRESSION: No acute abnormality. Mild chronic microvascular ischemic changes in the white matter. Electronically Signed   By: Franchot Gallo M.D.   On: 08/09/2020 12:49   MR BRAIN WO CONTRAST  Result Date: 08/10/2020 CLINICAL DATA:  Delirium. Hypertension, stage 3 chronic kidney disease. Crohn's disease. Smoker. EXAM: MRI HEAD WITHOUT CONTRAST TECHNIQUE: Multiplanar, multiecho pulse sequences of the brain and surrounding structures were obtained without intravenous contrast. COMPARISON:  CT head 08/09/2020 FINDINGS: Brain: Ventricle size and cerebral volume normal. Negative for acute infarct Periventricular deep white matter hyperintensities. Given history, this is most likely chronic  microvascular ischemia. Negative for hemorrhage or mass. Ossification of the tentorium on the right as noted on CT. Vascular: Normal arterial flow voids. Skull and upper cervical spine: Negative Sinuses/Orbits: Paranasal sinuses clear. Negative orbit. Right mastoid effusion. Other: None IMPRESSION: Negative for acute infarct Bilateral white matter changes most likely chronic microvascular ischemia given the clinical history and risk factors. Electronically Signed   By: Franchot Gallo M.D.   On: 08/10/2020 11:49   CT ABDOMEN PELVIS W CONTRAST  Result Date: 08/09/2020 CLINICAL DATA:  Abdominal pain EXAM: CT ABDOMEN AND PELVIS WITH CONTRAST TECHNIQUE: Multidetector CT imaging of the abdomen and pelvis was performed using the standard protocol following bolus administration of intravenous contrast. Oral contrast also administered. CONTRAST:  16m OMNIPAQUE IOHEXOL 300 MG/ML  SOLN COMPARISON:  August 02, 2020 FINDINGS: Lower chest: Lung bases are clear. Hepatobiliary: No focal liver lesions are appreciable. Gallbladder is absent. There is air within the biliary ductal system, likely secondary to previous sphincterotomy. No portal venous air evident. No appreciable biliary duct dilatation. Pancreas: There is no evident pancreatic mass or inflammatory focus. Note that there is pancreatic duct dilatation without pancreatic duct obstructing lesion. Spleen: No splenic lesions are evident. Adrenals/Urinary Tract: Adrenals bilaterally appear normal. Kidneys bilaterally show no evident mass or hydronephrosis on either side. No renal or ureteral calculus. Urinary bladder is midline with urinary bladder wall thickness normal. Stomach/Bowel: There is food material throughout the stomach. There are loops of dilated small bowel with a relative transition zone in the mid to distal ileum consistent with a degree of bowel obstruction. Oral contrast passes through this region, indicating incomplete bowel obstruction. Postoperative  change near the terminal ileum is noted. There is dilatation of colon throughout much of its course with fluid throughout much of the colon. There is an apparent soft tissue mass near the junction of the  descending colon and sigmoid colon extending over 2.5 cm with a suggested apple-core type appearance in this region. This lesion is best appreciated on coronal slice 25 series 5 but is also appreciated on axial slices 54 and 55 series 2. A smaller similar appearance in the mid sigmoid colon extends over a distance of 1.2 cm. This more distal focus of apple-core type appearance is seen best on axial slices 69 and 70 series 2. elsewhere there is fluid in the colon which suggests that there may be a degree of underlying colitis. There is no free air evident. Vascular/Lymphatic: There is no abdominal aortic aneurysm. There are foci of aortic atherosclerosis. Major venous structures appear patent. Note that there is a right femoral venous catheter with the tip in a branch of the hepatic vein on the right. No adenopathy is evident in the abdomen or pelvis. Reproductive: Uterus absent.  No adnexal masses. Other: Suspect previous appendiceal removal. No inflammation in the periappendiceal region evident. Musculoskeletal: There is degenerative change in the lumbar spine. No blastic or lytic bone lesions. There is sclerosis in the left iliac bone and in the left femoral head. 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 No intramuscular lesions are evident. IMPRESSION: 1. There are 2 areas of luminal narrowing with soft tissue thickening in the colon, located at the junction of the descending and sigmoid colon in the mid sigmoid colon. These areas have a somewhat apple-core type appearance in raise concern for foci of colonic carcinoma. Direct visualization of these areas may well be warranted. Fluid throughout much of the colon may indicate a degree of underlying colitis. 2. There is a transition zone in  the region of the distal ileum which may indicate a degree of partial bowel obstruction. Contrast flows through this area into the ascending colon. There are loops of dilated small bowel throughout much of the small bowel. There is no free air. 3. Air within the biliary ductal system is likely secondary to previous sphincterotomy. 4. Right femoral catheter has its tip in a branch of the right hepatic vein. 4. Pancreatic duct dilatation which may be indicative of a degree of pancreatitis. No pancreatic mass or pancreatic duct obstructing lesion evident. 5.  Gallbladder and uterus absent. 6.  Aortic Atherosclerosis (ICD10-I70.0). These results will be called to the ordering clinician or representative by the Radiologist Assistant, and communication documented in the PACS or [employer]. Electronically Signed   By: Lowella Grip III M.D.   On: 08/09/2020 17:03   IR Fluoro Guide CV Line Left  Result Date: 07/20/2020 INDICATION: 59 year old with history of short gut syndrome and Crohn's disease. Patient is on chronic TPN and has limited venous access. Patient has a left femoral tunneled central venous catheter. Request for catheter exchange due to bacteremia. EXAM: TUNNEL CENTRAL VENOUS CATHETER EXCHANGE WITH FLUOROSCOPY Physician: Stephan Minister. Anselm Pancoast, MD MEDICATIONS: None ANESTHESIA/SEDATION: Versed 2.0 mg IV; Fentanyl 100 mcg IV; Moderate Sedation Time:  18 minutes The patient was continuously monitored during the procedure by the interventional radiology nurse under my direct supervision. FLUOROSCOPY TIME:  Fluoroscopy Time: 1 minute, 6 seconds, 5 mGy COMPLICATIONS: None immediate. PROCEDURE: The procedure was explained to the patient. The risks and benefits of the procedure were discussed and the patient's questions were addressed. Informed consent was obtained from the patient. Left groin was prepped and draped in sterile fashion. Maximal barrier sterile technique was utilized including caps, mask, sterile  gowns, sterile gloves, sterile drape, hand hygiene and  skin antiseptic. Skin around the catheter was anesthetized with 1% lidocaine. The catheter cuff was exposed. The existing catheter was removed over a platinum plus wire. Dual lumen Powerline catheter was cut to a similar length as the prior catheter. Catheter was cut at 42 cm. Catheter was advanced over the wire and positioned in the IVC. Both lumens aspirated and flushed well. Both catheters were capped and clamped. Catheter was sutured to skin and a dressing was placed. Fluoroscopic images were taken and saved for this procedure. FINDINGS: Catheter tip in the IVC. IMPRESSION: Successful exchange of the left femoral tunneled central venous catheter with fluoroscopic guidance. Electronically Signed   By: Markus Daft M.D.   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 Patient is originally from [place] and has had multiple neck catheters which reportedly has resulted in central venous occlusion and as such underweight left femoral approach tunneled central venous catheter placement in October of 2020. Shortly thereafter, the patient moved to [facility] and has since been managed by the interventional radiology department undergoing fluoroscopic guided exchanges of the tunneled left femoral central venous catheter on 01/07/2020; 03/09/2020; 05/09/2020 and 07/20/2020. Patient presents today for central venous catheter evaluation and management in the setting of persistent bacteremia. Given persistent bacteremia despite recent fluoroscopic guided exchange performed 07/21/2019, the decision was made to proceed with placement of a new right common femoral vein approach tunneled central venous catheter in lieu of fluoroscopic guided exchange of the existing left common femoral approach central venous catheter.  EXAM: 1. ULTRASOUND AND FLUOROSCOPIC GUIDED PLACEMENT OF TUNNELED CENTRAL VENOUS CATHETER 2. BEDSIDE REMOVAL OF EXISTING LEFT COMMON FEMORAL VEIN APPROACH TUNNELED CENTRAL VENOUS CATHETER Comparisons: CT abdomen pelvis-08/02/2020; fluoroscopic guided central venous catheter exchange-07/20/2020; 05/09/2020; 03/09/2020; 01/07/2020 MEDICATIONS: Patient is currently admitted to the hospital receiving intravenous antibiotics. The antibiotic was given in an appropriate time interval prior to skin puncture. ANESTHESIA/SEDATION: Versed 2 mg IV; Fentanyl 100 mcg IV; Moderate Sedation Time:  11 The patient was continuously monitored during the procedure by the interventional radiology nurse under my direct supervision. FLUOROSCOPY TIME:  18 seconds (2 mGy) COMPLICATIONS: None immediate. PROCEDURE: Informed written consent was obtained from the patient after a discussion of the risks, benefits, and alternatives to treatment. Questions regarding the procedure were encouraged and answered. The right groin and upper thigh were prepped with chlorhexidine in a sterile fashion, and a sterile drape was applied covering the operative field. Maximum barrier sterile technique with sterile gowns and gloves were used for the procedure. A timeout was performed prior to the initiation of the procedure. After the overlying soft tissues were anesthetized, a small venotomy incision was created and a micropuncture kit was utilized to access the right common femoral vein. Real-time ultrasound guidance was utilized for vascular access including the acquisition of a permanent ultrasound image documenting patency of the accessed vessel. The microwire was utilized to measure appropriate catheter length. The micropuncture sheath was exchanged for a peel-away sheath over a guidewire. A 5 [place] dual lumen tunneled central venous catheter measuring 45 cm was tunneled in a retrograde fashion from the anterior lateral aspect of the right thigh to the  venotomy incision. The catheter was then placed through the peel-away sheath with tip ultimately positioned at the superior caval-atrial junction. Final catheter positioning was confirmed and documented with a spot radiographic image. The catheter aspirates  and flushes normally. The catheter was flushed with appropriate volume heparin dwells. The catheter exit site was secured with a 0-Prolene retention suture. The venotomy incision was closed with Dermabond and Steri-strips. Dressings were applied. Attention was now paid towards removal of the left common femoral vein approach central catheter. The retention suture was removed and the catheter was removed intact with manual compression. A dressing applied. The patient tolerated the above procedures well without immediate complication. FINDINGS: After catheter placement, the tip lies within the inferior cavoatrial junction the catheter aspirates and flushes normally and is ready for immediate use. Successful bedside removal of left common femoral approach central venous catheter. IMPRESSION: 1. Successful placement of 45 cm dual lumen tunneled central venous catheter via the right common femoral vein with tip terminating at the superior caval atrial junction. The catheter is ready for immediate use. 2. Successful bedside removal of existing left common femoral approach tunneled central venous catheter. Electronically Signed   By: Sandi Mariscal M.D.   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 Patient is originally from [place] and has had multiple neck catheters which reportedly has resulted in central venous occlusion and as such underweight left femoral approach tunneled central venous catheter placement in October of 2020. Shortly thereafter, the patient moved to [facility] and has since been managed by  the interventional radiology department undergoing fluoroscopic guided exchanges of the tunneled left femoral central venous catheter on 01/07/2020; 03/09/2020; 05/09/2020 and 07/20/2020. Patient presents today for central venous catheter evaluation and management in the setting of persistent bacteremia. Given persistent bacteremia despite recent fluoroscopic guided exchange performed 07/21/2019, the decision was made to proceed with placement of a new right common femoral vein approach tunneled central venous catheter in lieu of fluoroscopic guided exchange of the existing left common femoral approach central venous catheter. EXAM: 1. ULTRASOUND AND FLUOROSCOPIC GUIDED PLACEMENT OF TUNNELED CENTRAL VENOUS CATHETER 2. BEDSIDE REMOVAL OF EXISTING LEFT COMMON FEMORAL VEIN APPROACH TUNNELED CENTRAL VENOUS CATHETER Comparisons: CT abdomen pelvis-08/02/2020; fluoroscopic guided central venous catheter exchange-07/20/2020; 05/09/2020; 03/09/2020; 01/07/2020 MEDICATIONS: Patient is currently admitted to the hospital receiving intravenous antibiotics. The antibiotic was given in an appropriate time interval prior to skin puncture. ANESTHESIA/SEDATION: Versed 2 mg IV; Fentanyl 100 mcg IV; Moderate Sedation Time:  11 The patient was continuously monitored during the procedure by the interventional radiology nurse under my direct supervision. FLUOROSCOPY TIME:  18 seconds (2 mGy) COMPLICATIONS: None immediate. PROCEDURE: Informed written consent was obtained from the patient after a discussion of the risks, benefits, and alternatives to treatment. Questions regarding the procedure were encouraged and answered. The right groin and upper thigh were prepped with chlorhexidine in a sterile fashion, and a sterile drape was applied covering the operative field. Maximum barrier sterile technique with sterile gowns and gloves were used for the procedure. A timeout was performed prior to the initiation of the procedure. After the  overlying soft tissues were anesthetized, a small venotomy incision was created and a micropuncture kit was utilized to access the right common femoral vein. Real-time ultrasound guidance was utilized for vascular access including the acquisition of a permanent ultrasound image documenting patency of the accessed vessel. The microwire was utilized to measure appropriate catheter length. The micropuncture sheath was exchanged for a peel-away sheath over a guidewire. A 5 [place] dual lumen tunneled central venous catheter measuring  45 cm was tunneled in a retrograde fashion from the anterior lateral aspect of the right thigh to the venotomy incision. The catheter was then placed through the peel-away sheath with tip ultimately positioned at the superior caval-atrial junction. Final catheter positioning was confirmed and documented with a spot radiographic image. The catheter aspirates and flushes normally. The catheter was flushed with appropriate volume heparin dwells. The catheter exit site was secured with a 0-Prolene retention suture. The venotomy incision was closed with Dermabond and Steri-strips. Dressings were applied. Attention was now paid towards removal of the left common femoral vein approach central catheter. The retention suture was removed and the catheter was removed intact with manual compression. A dressing applied. The patient tolerated the above procedures well without immediate complication. FINDINGS: After catheter placement, the tip lies within the inferior cavoatrial junction the catheter aspirates and flushes normally and is ready for immediate use. Successful bedside removal of left common femoral approach central venous catheter. IMPRESSION: 1. Successful placement of 45 cm dual lumen tunneled central venous catheter via the right common femoral vein with tip terminating at the superior caval atrial junction. The catheter is ready for immediate use. 2. Successful bedside removal of existing  left common femoral approach tunneled central venous catheter. Electronically Signed   By: Sandi Mariscal M.D.   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 Patient is originally from [place] and has had multiple neck catheters which reportedly has resulted in central venous occlusion and as such underweight left femoral approach tunneled central venous catheter placement in October of 2020. Shortly thereafter, the patient moved to [facility] and has since been managed by the interventional radiology department undergoing fluoroscopic guided exchanges of the tunneled left femoral central venous catheter on 01/07/2020; 03/09/2020; 05/09/2020 and 07/20/2020. Patient presents today for central venous catheter evaluation and management in the setting of persistent bacteremia. Given persistent bacteremia despite recent fluoroscopic guided exchange performed 07/21/2019, the decision was made to proceed with placement of a new right common femoral vein approach tunneled central venous catheter in lieu of fluoroscopic guided exchange of the existing left common femoral approach central venous catheter. EXAM: 1. ULTRASOUND AND FLUOROSCOPIC GUIDED PLACEMENT OF TUNNELED CENTRAL VENOUS CATHETER 2. BEDSIDE REMOVAL OF EXISTING LEFT COMMON FEMORAL VEIN APPROACH TUNNELED CENTRAL VENOUS CATHETER Comparisons: CT abdomen pelvis-08/02/2020; fluoroscopic guided central venous catheter exchange-07/20/2020; 05/09/2020; 03/09/2020; 01/07/2020 MEDICATIONS: Patient is currently admitted to the hospital receiving intravenous antibiotics. The antibiotic was given in an appropriate time interval prior to skin puncture. ANESTHESIA/SEDATION: Versed 2 mg IV; Fentanyl 100 mcg IV; Moderate Sedation Time:  11 The patient was continuously monitored during the procedure by the interventional  radiology nurse under my direct supervision. FLUOROSCOPY TIME:  18 seconds (2 mGy) COMPLICATIONS: None immediate. PROCEDURE: Informed written consent was obtained from the patient after a discussion of the risks, benefits, and alternatives to treatment. Questions regarding the procedure were encouraged and answered. The right groin and upper thigh were prepped with chlorhexidine in a sterile fashion, and a sterile drape was applied covering the operative field. Maximum barrier sterile technique with sterile gowns and gloves were used for the procedure. A timeout was performed prior to the initiation of the procedure. After the overlying soft tissues were anesthetized, a small venotomy incision was created and a micropuncture kit was utilized to access the right common femoral vein.  Real-time ultrasound guidance was utilized for vascular access including the acquisition of a permanent ultrasound image documenting patency of the accessed vessel. The microwire was utilized to measure appropriate catheter length. The micropuncture sheath was exchanged for a peel-away sheath over a guidewire. A 5 [place] dual lumen tunneled central venous catheter measuring 45 cm was tunneled in a retrograde fashion from the anterior lateral aspect of the right thigh to the venotomy incision. The catheter was then placed through the peel-away sheath with tip ultimately positioned at the superior caval-atrial junction. Final catheter positioning was confirmed and documented with a spot radiographic image. The catheter aspirates and flushes normally. The catheter was flushed with appropriate volume heparin dwells. The catheter exit site was secured with a 0-Prolene retention suture. The venotomy incision was closed with Dermabond and Steri-strips. Dressings were applied. Attention was now paid towards removal of the left common femoral vein approach central catheter. The retention suture was removed and the catheter was removed intact with  manual compression. A dressing applied. The patient tolerated the above procedures well without immediate complication. FINDINGS: After catheter placement, the tip lies within the inferior cavoatrial junction the catheter aspirates and flushes normally and is ready for immediate use. Successful bedside removal of left common femoral approach central venous catheter. IMPRESSION: 1. Successful placement of 45 cm dual lumen tunneled central venous catheter via the right common femoral vein with tip terminating at the superior caval atrial junction. The catheter is ready for immediate use. 2. Successful bedside removal of existing left common femoral approach tunneled central venous catheter. Electronically Signed   By: Sandi Mariscal M.D.   On: 08/05/2020 14:41   DG Chest Port 1 View  Result Date: 08/02/2020 CLINICAL DATA:  Fever, sepsis EXAM: PORTABLE CHEST 1 VIEW COMPARISON:  07/19/2020 FINDINGS: Single frontal view of the chest demonstrates a stable cardiac silhouette. No airspace disease, effusion, or pneumothorax. No acute bony abnormalities. Surgical clips right hilum unchanged. IMPRESSION: 1. No acute intrathoracic process. Electronically Signed   By: Randa Ngo M.D.   On: 08/02/2020 20:00   DG Chest Port 1 View  Result Date: 07/19/2020 CLINICAL DATA:  Sepsis, fever EXAM: PORTABLE CHEST 1 VIEW COMPARISON:  06/27/2020 FINDINGS: The heart size and mediastinal contours are stable. No focal airspace consolidation, pleural effusion, or pneumothorax. The visualized skeletal structures are unremarkable. IMPRESSION: No active disease. Electronically Signed   By: Davina Poke D.O.   On: 07/19/2020 14:40   DG Abd 2 Views  Result Date: 08/11/2020 CLINICAL DATA:  Small bowel obstruction. EXAM: X-RAY ABDOMEN 3 VIEWS COMPARISON:  August 08, 2020. FINDINGS: Postsurgical changes are seen in the right side of the abdomen. Stable air-filled distended colon is noted. No definite small bowel dilatation is noted.  Status post cholecystectomy. No free air is noted on decubitus views. IMPRESSION: Stable air-filled distended colon is noted. No definite small bowel dilatation is noted. Electronically Signed   By: Marijo Conception M.D.   On: 08/11/2020 10:23   DG Abd 2 Views  Result Date: 08/08/2020 CLINICAL DATA:  Follow-up ileus.  Central abdominal discomfort. EXAM: X-RAY ABDOMEN 2 VIEWS COMPARISON:  08/07/2020 FINDINGS: Right lower extremity central venous catheter is noted with tip at the inferior cavoatrial junction. Pneumobilia is again identified. No significant interval change in the appearance of gaseous distension of the colon. IMPRESSION: 1. No significant interval change in gaseous distension of the colon compatible with colonic ileus. 2. Pneumobilia. Electronically Signed   By: Queen Slough.D.  On: 08/08/2020 11:01   DG Abd 2 Views  Result Date: 08/07/2020 CLINICAL DATA:  Epigastric pain, history of Crohn's disease and small bowel disease EXAM: X-RAY ABDOMEN 2 VIEWS COMPARISON:  CT 08/02/2020 FINDINGS: There is diffuse gaseous distention of the colon, nonspecific. Surgical material is seen throughout the right hemiabdomen and to the left of midline compatible with patient's extensive surgical history. Additional clips noted in the region of the gallbladder fossa and along the surface of the liver are also compatible with the history of cholecystectomy. No suspicious calcifications. Few phleboliths in the pelvis. Right lower extremity PICC terminates at the inferior cavoatrial junction. Cardiomediastinal contours as included are unremarkable. Lung bases demonstrate some atelectatic changes but are otherwise clear. IMPRESSION: 1. Diffuse gaseous distention of the colon, nonspecific. Correlate with features of ileus. 2. Postsurgical changes throughout the abdomen compatible with patient's extensive surgical history. 3. Right lower extremity PICC terminates at the inferior cavoatrial junction. Electronically  Signed   By: Lovena Le M.D.   On: 08/07/2020 15:25   US Abdomen Limited RUQ  Result Date: 07/19/2020 CLINICAL DATA:  Elevated LFTs. EXAM: ULTRASOUND ABDOMEN LIMITED RIGHT UPPER QUADRANT COMPARISON:  CT 06/27/2020.  Abdominal ultrasound 05/06/2020 FINDINGS: Gallbladder: Surgically absent. Common bile duct: Diameter: 4 mm, normal. Liver: No focal lesion identified. Mildly heterogeneous in parenchymal echogenicity. Mild central intrahepatic biliary ductal dilatation. Pneumobilia on prior CT is not well demonstrated. Portal vein is patent on color Doppler imaging with normal direction of blood flow towards the liver. Other: No visualized ascites. IMPRESSION: 1. Mild central intrahepatic biliary ductal dilatation, of doubtful clinical significance. Common bile duct is normal at 4 mm. Pneumobilia on prior CT is not well demonstrated sonographically. 2. Mild heterogeneity of the hepatic parenchyma without focal lesion. 3. Cholecystectomy. Electronically Signed   By: Keith Rake M.D.   On: 07/19/2020 16:48    Microbiology: Recent Results (from the past 240 hour(s))  Blood Culture (routine x 2)     Status: Abnormal   Collection Time: 08/02/20  7:26 PM   Specimen: BLOOD  Result Value Ref Range Status   Specimen Description   Final    BLOOD BLOOD RIGHT ARM Performed at [facility] [address], [place], [place] [address]    Special Requests   Final    BOTTLES DRAWN AEROBIC AND ANAEROBIC Blood Culture results may not be optimal due to an excessive volume of blood received in culture bottles Performed at [facility] [address] [place], [place] [address]    Culture  Setup Time   Final    GRAM NEGATIVE RODS IN BOTH AEROBIC AND ANAEROBIC BOTTLES CRITICAL VALUE NOTED.  VALUE IS CONSISTENT WITH PREVIOUSLY REPORTED AND CALLED VALUE.    Culture (A)  Final    ENTEROBACTER CLOACAE SUSCEPTIBILITIES PERFORMED ON PREVIOUS CULTURE WITHIN THE LAST 5  DAYS. Performed at [facility], [place] [address], [place], [place] [address]    Report Status 08/05/2020 FINAL  Final  Urine culture     Status: None   Collection Time: 08/02/20  7:26 PM   Specimen: In/Out Cath Urine  Result Value Ref Range Status   Specimen Description   Final    IN/OUT CATH URINE Performed at [facility] [address] [place], [place] [address]    Special Requests   Final    NONE Performed at [facility], [place] [address] [place], [place] [address]    Culture   Final  NO GROWTH Performed at [facility], [place] [address], [place], [place] [address]    Report Status 08/04/2020 FINAL  Final  Blood Culture (routine x 2)     Status: Abnormal   Collection Time: 08/02/20  7:31 PM   Specimen: BLOOD  Result Value Ref Range Status   Specimen Description   Final    BLOOD FEM LINE Performed at [facility] [address] [place], [place] [address]    Special Requests   Final    BOTTLES DRAWN AEROBIC AND ANAEROBIC Blood Culture adequate volume Performed at [facility] [address], [place], [place] [address]    Culture  Setup Time   Final    GRAM NEGATIVE RODS IN BOTH AEROBIC AND ANAEROBIC BOTTLES Organism ID to follow CRITICAL RESULT CALLED TO, READ BACK BY AND VERIFIED WITH: J. LEGGE PHARMD, AT 1300 08/03/20 BY D. VANHOOK Performed at [facility], [place] [address] [place],  [address]    Culture ENTEROBACTER CLOACAE (A)  Final   Report Status 08/05/2020 FINAL  Final   Organism ID, Bacteria ENTEROBACTER CLOACAE  Final      Susceptibility   Enterobacter cloacae - MIC*    CEFAZOLIN >=64 RESISTANT Resistant     CEFEPIME <=0.12 SENSITIVE Sensitive     CEFTAZIDIME <=1 SENSITIVE Sensitive     CIPROFLOXACIN 0.5 SENSITIVE Sensitive     GENTAMICIN <=1 SENSITIVE Sensitive     IMIPENEM <=0.25 SENSITIVE Sensitive     TRIMETH/SULFA >=320 RESISTANT Resistant     PIP/TAZO <=4  SENSITIVE Sensitive     * ENTEROBACTER CLOACAE  Blood Culture ID Panel (Reflexed)     Status: Abnormal   Collection Time: 08/02/20  7:31 PM  Result Value Ref Range Status   Enterococcus faecalis NOT DETECTED NOT DETECTED Final   Enterococcus Faecium NOT DETECTED NOT DETECTED Final   Listeria monocytogenes NOT DETECTED NOT DETECTED Final   Staphylococcus species NOT DETECTED NOT DETECTED Final   Staphylococcus aureus (BCID) NOT DETECTED NOT DETECTED Final   Staphylococcus epidermidis NOT DETECTED NOT DETECTED Final   Staphylococcus lugdunensis NOT DETECTED NOT DETECTED Final   Streptococcus species NOT DETECTED NOT DETECTED Final   Streptococcus agalactiae NOT DETECTED NOT DETECTED Final   Streptococcus pneumoniae NOT DETECTED NOT DETECTED Final   Streptococcus pyogenes NOT DETECTED NOT DETECTED Final   A.calcoaceticus-baumannii NOT DETECTED NOT DETECTED Final   Bacteroides fragilis NOT DETECTED NOT DETECTED Final   Enterobacterales DETECTED (A) NOT DETECTED Final    Comment: Enterobacterales represent a large order of gram negative bacteria, not a single organism. CRITICAL RESULT CALLED TO, READ BACK BY AND VERIFIED WITH: J. LEGGE PHARMD, AT 1300 08/03/20 BY D. VANHOOK    Enterobacter cloacae complex DETECTED (A) NOT DETECTED Final    Comment: CRITICAL RESULT CALLED TO, READ BACK BY AND VERIFIED WITH: J. LEGGE PHARMD, AT 1300 08/03/20 BY D. VANHOOK    Escherichia coli NOT DETECTED NOT DETECTED Final   Klebsiella aerogenes NOT DETECTED NOT DETECTED Final   Klebsiella oxytoca NOT DETECTED NOT DETECTED Final   Klebsiella pneumoniae NOT DETECTED NOT DETECTED Final   Proteus species NOT DETECTED NOT DETECTED Final   Salmonella species NOT DETECTED NOT DETECTED Final   Serratia marcescens NOT DETECTED NOT DETECTED Final   Haemophilus influenzae NOT DETECTED NOT DETECTED Final   Neisseria meningitidis NOT DETECTED NOT DETECTED Final   Pseudomonas aeruginosa NOT DETECTED NOT DETECTED Final    Stenotrophomonas maltophilia NOT DETECTED NOT  DETECTED Final   Candida albicans NOT DETECTED NOT DETECTED Final   Candida auris NOT DETECTED NOT DETECTED Final   Candida glabrata NOT DETECTED NOT DETECTED Final   Candida krusei NOT DETECTED NOT DETECTED Final   Candida parapsilosis NOT DETECTED NOT DETECTED Final   Candida tropicalis NOT DETECTED NOT DETECTED Final   Cryptococcus neoformans/gattii NOT DETECTED NOT DETECTED Final   CTX-M ESBL NOT DETECTED NOT DETECTED Final   Carbapenem resistance IMP NOT DETECTED NOT DETECTED Final   Carbapenem resistance KPC NOT DETECTED NOT DETECTED Final   Carbapenem resistance NDM NOT DETECTED NOT DETECTED Final   Carbapenem resist OXA 48 LIKE NOT DETECTED NOT DETECTED Final   Carbapenem resistance VIM NOT DETECTED NOT DETECTED Final    Comment: Performed at [facility], [address] [place], [place] [address]  SARS Coronavirus 2 by RT PCR (hospital order, performed in [facility] hospital lab) Nasopharyngeal Nasopharyngeal Swab     Status: None   Collection Time: 08/02/20 10:40 PM   Specimen: Nasopharyngeal Swab  Result Value Ref Range Status   SARS Coronavirus 2 NEGATIVE NEGATIVE Final    Comment: (NOTE) SARS-CoV-2 target nucleic acids are NOT DETECTED.  The SARS-CoV-2 RNA is generally detectable in upper and lower respiratory specimens during the acute phase of infection. The lowest concentration of SARS-CoV-2 viral copies this assay can detect is 250 copies / mL. A negative result does not preclude SARS-CoV-2 infection and should not be used as the sole basis for treatment or other patient management decisions.  A negative result may occur with improper specimen collection / handling, submission of specimen other than nasopharyngeal swab, presence of viral mutation(s) within the areas targeted by this assay, and inadequate number of viral copies (<250 copies / mL). A negative result must be combined with clinical observations,  patient history, and epidemiological information.  Fact Sheet for Patients:   StrictlyIdeas.no  Fact Sheet for Healthcare Providers: BankingDealers.co.za  This test is not yet approved or  cleared by the [place] FDA and has been authorized for detection and/or diagnosis of SARS-CoV-2 by FDA under an Emergency Use Authorization (EUA).  This EUA will remain in effect (meaning this test can be used) for the duration of the COVID-19 declaration under Section 564(b)(1) of the Act, 21 U.S.C. section 360bbb-3(b)(1), unless the authorization is terminated or revoked sooner.  Performed at [facility] [place] [address] [place], [place] [address]   Culture, blood (routine x 2)     Status: None   Collection Time: 08/07/20  7:29 AM   Specimen: BLOOD  Result Value Ref Range Status   Specimen Description   Final    BLOOD BLOOD RIGHT HAND Performed at [place] [address] [place], [place] [address]    Special Requests   Final    BOTTLES DRAWN AEROBIC ONLY Blood Culture adequate volume Performed at [place] [address], [place], [place] [address]    Culture   Final    NO GROWTH 5 DAYS Performed at [facility], [place] [address] [place], [place] [address]    Report Status 08/12/2020 FINAL  Final  Culture, blood (routine x 2)     Status: None   Collection Time: 08/07/20  7:29 AM   Specimen: BLOOD  Result Value Ref Range Status   Specimen Description   Final    BLOOD BLOOD RIGHT HAND Performed at [place] [address] [place],  [place] [address]    Special Requests   Final    BOTTLES DRAWN AEROBIC ONLY Blood Culture adequate volume Performed at [place] [address] [place], [place] [address]    Culture   Final    NO GROWTH 5 DAYS Performed at [facility], [place] [address] [place], [place]  [address]    Report Status 08/12/2020 FINAL  Final     Labs: Basic Metabolic Panel: Recent Labs  Lab 08/09/20 0610 08/10/20 0545 08/10/20 0800 08/11/20 0654 08/12/20 0513  NA 136 SPECIMEN CONTAMINATED, UNABLE TO PERFORM TEST(S). 138 139 137  K 3.4* SPECIMEN CONTAMINATED, UNABLE TO PERFORM TEST(S). 3.6 3.8 3.9  CL 103 SPECIMEN CONTAMINATED, UNABLE TO PERFORM TEST(S). 109 109 111  CO2 22 SPECIMEN CONTAMINATED, UNABLE TO PERFORM TEST(S). 21* 21* 21*  GLUCOSE 102* SPECIMEN CONTAMINATED, UNABLE TO PERFORM TEST(S). 124* 116* 95  BUN 16 SPECIMEN CONTAMINATED, UNABLE TO PERFORM TEST(S). 13 21* 31*  CREATININE 1.68* SPECIMEN CONTAMINATED, UNABLE TO PERFORM TEST(S). 1.40* 1.22* 1.25*  CALCIUM 9.9 SPECIMEN CONTAMINATED, UNABLE TO PERFORM TEST(S). 10.0 10.4* 9.9  MG 1.8 SPECIMEN CONTAMINATED, UNABLE TO PERFORM TEST(S). 1.8 1.7 1.9  PHOS 3.2 SPECIMEN CONTAMINATED, UNABLE TO PERFORM TEST(S). 2.5 3.4 3.4   Liver Function Tests: Recent Labs  Lab 08/10/20 0545 08/10/20 0800 08/11/20 0654  AST SPECIMEN CONTAMINATED, UNABLE TO PERFORM TEST(S). 23 17  ALT SPECIMEN CONTAMINATED, UNABLE TO PERFORM TEST(S). 40 33  ALKPHOS SPECIMEN CONTAMINATED, UNABLE TO PERFORM TEST(S). 203* 195*  BILITOT SPECIMEN CONTAMINATED, UNABLE TO PERFORM TEST(S). 0.9 0.5  PROT SPECIMEN CONTAMINATED, UNABLE TO PERFORM TEST(S). 7.1 7.3  ALBUMIN SPECIMEN CONTAMINATED, UNABLE TO PERFORM TEST(S). 2.9* 2.9*   No results for input(s): LIPASE, AMYLASE in the last 168 hours. Recent Labs  Lab 08/10/20 0559 08/10/20 0800  AMMONIA SPECIMEN CONTAMINATED, UNABLE TO PERFORM TEST(S). 33   CBC: Recent Labs  Lab 08/05/20 1605 08/06/20 0551 08/08/20 0525 08/09/20 0610 08/10/20 0545  WBC 3.4* 3.5* 4.2 5.6 4.1  NEUTROABS 1.7 1.6* 1.9 2.7 2.1  HGB 7.7* 7.8* 8.6* 8.4* 8.3*  HCT 23.7* 24.0* 26.4* 25.7* 25.6*  MCV 93.7 92.3 92.0 92.8 94.5  PLT 147* 152 177 193 192   Cardiac Enzymes: No results for input(s): CKTOTAL, CKMB,  CKMBINDEX, TROPONINI in the last 168 hours. BNP: BNP (last 3 results) Recent Labs    10/12/19 2059 10/28/19 0435 11/26/19 0209  BNP 253.9* 162.4* 179.1*    ProBNP (last 3 results) No results for input(s): PROBNP in the last 8760 hours.  CBG: Recent Labs  Lab 08/10/20 0730 08/10/20 1352 08/10/20 2153 08/11/20 0555 08/11/20 1423  GLUCAP 121* 122* 124* 120* 110*       Signed:  Irine Seal MD.  [facility] 08/12/2020, 3:09 PM

## 2020-08-12 NOTE — TOC Transition Note (Signed)
Transition of Care [facility]) - CM/SW Discharge Note   Patient Details  Name: Caitlin Peters MRN: [id number] Date of Birth: [birth year]  Transition of Care [facility]) CM/SW Contact:  Lynnell Catalan, RN Phone Number: 08/12/2020, 3:11 PM   Clinical Narrative:    Pt to dc back home today with TPN. [employer] liaison alerted and ready to provide services at home. Pt requested cane for dc. Kasandra Knudsen was ordered and [employer] was informed it should be delivered to pt room. Pt to dc home via private vehicle.    Final next level of care: [place] Barriers to Discharge: Continued Medical Work up     [facility] Arranged: TPN HH Agency: [facility], [facility]        Social Determinants of Health (SDOH) Interventions     Readmission Risk Interventions Readmission Risk Prevention Plan 08/05/2020 06/29/2020 11/30/2019  Transportation Screening Complete Complete Complete  PCP or Specialist Appt within 3-5 Days - - -  Not Complete comments - - -  HRI or [place] Work Consult for [place] Planning/Counseling - - -  SW consult not completed comments - - -  Palliative Care Screening - - -  Medication Review Press photographer) Complete Complete Complete  PCP or Specialist appointment within 3-5 days of discharge Complete Complete Complete  HRI or Home Care Consult Complete Complete Complete  SW Recovery Care/Counseling Consult Complete Complete Complete  Palliative Care Screening Not Applicable Not Applicable Not [place] Not Applicable Not Applicable Not Applicable

## 2020-08-12 NOTE — Progress Notes (Signed)
Spoke with RN, pt may be discharged later today.  Continue tpa infusion per MD until home health takes over care.  Pt TNA running at 164m/hr.  Assessed infusion bag and patient has approximately 1/2 the bag left to infuse.  RN made aware to contact IVT if update is necessary.

## 2020-08-15 ENCOUNTER — Telehealth: Payer: Self-pay | Admitting: Internal Medicine

## 2020-08-15 NOTE — Telephone Encounter (Signed)
Transition Care Management Unsuccessful Follow-up Telephone Call  Date of discharge and from where:  08/12/2020 from [facility]  Attempts:  1st Attempt  Reason for unsuccessful TCM follow-up call:  Unable to leave message

## 2020-08-24 ENCOUNTER — Other Ambulatory Visit: Payer: Self-pay | Admitting: Internal Medicine

## 2020-08-25 ENCOUNTER — Telehealth: Payer: Self-pay | Admitting: Internal Medicine

## 2020-08-25 NOTE — Telephone Encounter (Signed)
Ok to send for 90 days

## 2020-08-25 NOTE — Telephone Encounter (Signed)
promethazine (PHENERGAN) 25 MG tablet  [facility] - [place], [place] - [address] Phone:  [phone]  Fax:  [phone]      [place] [phone]  The patient has been throwing up for 2 - 3 day snow and she don't want the patient to get dehydrated and back in the hospital   Her BP was 160-80  She would like to have 90 days  Called in for this Rx instead of 20 tablets.

## 2020-08-25 NOTE — Telephone Encounter (Signed)
Pts daughter is calling in stating that she is calling in wanting to know why the pt is not getting 90 days worth of her promethazine (PHENERGAN) 25 MG.  Pharm:  [facility]  Pts daughter would like to have a call back.

## 2020-08-26 MED ORDER — PROMETHAZINE HCL 25 MG PO TABS: ORAL_TABLET | ORAL | 1 refills | Status: DC

## 2020-08-26 NOTE — Telephone Encounter (Signed)
Refill sent.

## 2020-08-26 NOTE — Telephone Encounter (Signed)
Rx called in after error message

## 2020-08-26 NOTE — Addendum Note (Signed)
Addended by: Westley Hummer B on: 08/26/2020 06:58 AM   Modules accepted: Orders

## 2020-08-30 ENCOUNTER — Emergency Department (HOSPITAL_COMMUNITY)
Admission: EM | Admit: 2020-08-30 | Discharge: 2020-08-31 | Disposition: A | Discharge status: Home / Self Care | Payer: Medicare Other | Source: Home / Self Care | Attending: Emergency Medicine | Admitting: Emergency Medicine

## 2020-08-30 ENCOUNTER — Emergency Department (HOSPITAL_COMMUNITY): Payer: Medicare Other

## 2020-08-30 ENCOUNTER — Other Ambulatory Visit: Payer: Self-pay

## 2020-08-30 DIAGNOSIS — N1832 Chronic kidney disease, stage 3b: Secondary | ICD-10-CM | POA: Insufficient documentation

## 2020-08-30 DIAGNOSIS — R197 Diarrhea, unspecified: Secondary | ICD-10-CM | POA: Insufficient documentation

## 2020-08-30 DIAGNOSIS — R5381 Other malaise: Secondary | ICD-10-CM | POA: Insufficient documentation

## 2020-08-30 DIAGNOSIS — R7881 Bacteremia: Secondary | ICD-10-CM | POA: Diagnosis not present

## 2020-08-30 DIAGNOSIS — R1030 Lower abdominal pain, unspecified: Secondary | ICD-10-CM | POA: Insufficient documentation

## 2020-08-30 DIAGNOSIS — R1084 Generalized abdominal pain: Secondary | ICD-10-CM

## 2020-08-30 DIAGNOSIS — Z87891 Personal history of nicotine dependence: Secondary | ICD-10-CM | POA: Insufficient documentation

## 2020-08-30 DIAGNOSIS — R509 Fever, unspecified: Secondary | ICD-10-CM

## 2020-08-30 DIAGNOSIS — I129 Hypertensive chronic kidney disease with stage 1 through stage 4 chronic kidney disease, or unspecified chronic kidney disease: Secondary | ICD-10-CM | POA: Insufficient documentation

## 2020-08-30 DIAGNOSIS — T80211A Bloodstream infection due to central venous catheter, initial encounter: Secondary | ICD-10-CM | POA: Diagnosis not present

## 2020-08-30 DIAGNOSIS — R7989 Other specified abnormal findings of blood chemistry: Secondary | ICD-10-CM

## 2020-08-30 DIAGNOSIS — R945 Abnormal results of liver function studies: Secondary | ICD-10-CM | POA: Insufficient documentation

## 2020-08-30 DIAGNOSIS — Z79899 Other long term (current) drug therapy: Secondary | ICD-10-CM | POA: Insufficient documentation

## 2020-08-30 DIAGNOSIS — Z20822 Contact with and (suspected) exposure to covid-19: Secondary | ICD-10-CM | POA: Insufficient documentation

## 2020-08-30 LAB — I-STAT BETA HCG BLOOD, ED (MC, WL, AP ONLY): I-stat hCG, quantitative: <5 m[IU]/mL (ref <5)

## 2020-08-30 MED ORDER — LACTATED RINGERS IV BOLUS (SEPSIS)
1000.0000 mL | Freq: Once | INTRAVENOUS | Status: AC
  Administered 2020-08-30 – 2020-08-31 (×2): 1000 mL via INTRAVENOUS

## 2020-08-30 NOTE — ED Provider Notes (Signed)
[facility] DEPT Provider Note   CSN: [id number] Arrival date & time: 08/30/20  2224     History Chief Complaint  Patient presents with  . Fever  . Fatigue    Caitlin Peters is a 59 y.o. female.  HPI     This a 59 year old female with a history of Crohn's disease, hypertension, pancreatitis who presents with fever and malaise.  Patient reports she developed fever up to 101 at home and has felt generally fatigued.  She is reporting lower abdominal pain.  She states that it is sharp.  It is worse when she has bowel movements.  She reports nausea without vomiting.  She reports loose diarrhea stools.  She denies any cough, congestion, upper respiratory symptoms, dysuria.  No known sick contacts or Covid exposures.  She is vaccinated against COVID-19.  She has a PICC line in the right lower extremity for TPN.  Reports that this was placed recently.  She is followed by gastroenterology at [facility].  Past Medical History:  Diagnosis Date  . Acute pancreatitis 04/13/2020  . Anasarca 10/2019  . AVN (avascular necrosis of bone) ([place])   . Cataract   . Chronic pain syndrome   . CKD (chronic kidney disease), stage III   . Crohn disease ([place])   . Crohn disease ([place])   . Depression   . Diverticulosis   . GERD (gastroesophageal reflux disease)   . HTN (hypertension)   . IDA (iron deficiency anemia)   . Malnutrition ([place])   . Mass in chest   . Osteoporosis   . Pancreatitis   . Short gut syndrome   . Vitamin B12 deficiency     Patient Active Problem List   Diagnosis Date Noted  . Acute metabolic encephalopathy   . Hypomagnesemia   . Partial small bowel obstruction ([place])   . Abnormal CT of the abdomen   . Bacteremia 08/04/2020  . Sepsis ([place]) 08/02/2020  . Enterobacter sepsis ([place]) 07/22/2020  . SIRS (systemic inflammatory response syndrome) ([place]) 07/19/2020  . Anxiety 06/03/2020  . Acute pancreatitis 04/13/2020  . Infection due to Stenotrophomonas  maltophilia 03/10/2020  . Abnormal LFTs 03/05/2020  . Pancytopenia ([place]) 03/05/2020  . Stage 3b chronic kidney disease ([place]) 03/05/2020  . Central line infection   . On total parenteral nutrition (TPN) 01/02/2020  . Elevated liver enzymes 01/02/2020  . Cholangitis   . Anasarca 10/28/2019  . Acute kidney injury superimposed on chronic kidney disease ([place]) 10/28/2019  . Falls 10/28/2019  . Crohn disease ([place])   . Malnutrition ([place])   . Short gut syndrome   . HTN (hypertension)   . Vitamin B12 deficiency   . Osteoporosis   . Depression   . GERD (gastroesophageal reflux disease)   . Chronic pain syndrome   . Hypokalemia due to excessive gastrointestinal loss of potassium 10/13/2019  . Diarrhea   . Fever   . Hypokalemia 10/12/2019  . Chronic diarrhea 10/12/2019  . Dysuria 10/12/2019  . Bilateral lower extremity edema 10/12/2019  . Crohn's colitis ([place]) 10/12/2019  . Anemia 10/12/2019  . Hypertension 10/12/2019  . AKI (acute kidney injury) ([place]) 10/12/2019    Past Surgical History:  Procedure Laterality Date  . ABDOMINAL HYSTERECTOMY    . ABDOMINAL SURGERY     colon resection with abdominal stoma, repair of intestinal leak, reversal of abdominal stoma  . BILIARY DILATION  11/26/2019   Procedure: BILIARY DILATION;  Surgeon: Jackquline Denmark, MD;  Location: [facility];  Service: Endoscopy;;  .  BILIARY DILATION  03/08/2020   Procedure: BILIARY DILATION;  Surgeon: Rush Landmark Telford Nab., MD;  Location: [facility];  Service: Gastroenterology;;  . BIOPSY  03/08/2020   Procedure: BIOPSY;  Surgeon: Irving Copas., MD;  Location: [facility];  Service: Gastroenterology;;  . CHEST WALL RESECTION     right thoracotomy,resection of chest mass with anterior rib and reconstruction using prosthetic mesh and video arthroscopy  . CHOLECYSTECTOMY    . COLONOSCOPY  2019  . ERCP N/A 11/26/2019   Procedure: ENDOSCOPIC RETROGRADE CHOLANGIOPANCREATOGRAPHY (ERCP);  Surgeon: Jackquline Denmark, MD;  Location: [facility];  Service: Endoscopy;  Laterality: N/A;  . ERCP N/A 03/08/2020   Procedure: ENDOSCOPIC RETROGRADE CHOLANGIOPANCREATOGRAPHY (ERCP);  Surgeon: Irving Copas., MD;  Location: [facility];  Service: Gastroenterology;  Laterality: N/A;  . ESOPHAGOGASTRODUODENOSCOPY N/A 03/08/2020   Procedure: ESOPHAGOGASTRODUODENOSCOPY (EGD);  Surgeon: Irving Copas., MD;  Location: [facility];  Service: Gastroenterology;  Laterality: N/A;  . EUS N/A 03/08/2020   Procedure: UPPER ENDOSCOPIC ULTRASOUND (EUS) LINEAR;  Surgeon: Irving Copas., MD;  Location: [facility];  Service: Gastroenterology;  Laterality: N/A;  . IR FLUORO GUIDE CV LINE LEFT  01/07/2020  . IR FLUORO GUIDE CV LINE LEFT  03/09/2020  . IR FLUORO GUIDE CV LINE LEFT  05/09/2020  . IR FLUORO GUIDE CV LINE LEFT  07/20/2020  . IR FLUORO GUIDE CV LINE RIGHT  08/05/2020  . IR PTA VENOUS EXCEPT DIALYSIS CIRCUIT  01/07/2020  . IR REMOVAL TUN CV CATH W/O FL  08/05/2020  . IR US GUIDE VASC ACCESS LEFT     x 2 06/17/19 and 09/14/2019  . IR US GUIDE VASC ACCESS RIGHT  08/05/2020  . KNEE SURGERY     right knee   . REMOVAL OF STONES  11/26/2019   Procedure: REMOVAL OF STONES;  Surgeon: Jackquline Denmark, MD;  Location: [facility];  Service: Endoscopy;;  . REMOVAL OF STONES  03/08/2020   Procedure: REMOVAL OF STONES;  Surgeon: Irving Copas., MD;  Location: [facility];  Service: Gastroenterology;;  . SMALL INTESTINE SURGERY     x3  . SPHINCTEROTOMY  11/26/2019   Procedure: SPHINCTEROTOMY;  Surgeon: Jackquline Denmark, MD;  Location: [facility];  Service: Endoscopy;;  . UPPER GASTROINTESTINAL ENDOSCOPY       OB History   No obstetric history on file.     Family History  Problem Relation Age of Onset  . Breast cancer Sister   . Multiple sclerosis Sister   . Diabetes Sister   . Lupus Sister   . Colon cancer Other   . Crohn's disease Other   . Seizures Mother   . Glaucoma Mother   . CAD  Father   . Heart disease Father   . Hypertension Father     Social History   Tobacco Use  . Smoking status: Former Research scientist (life sciences)  . Smokeless tobacco: Never Used  Vaping Use  . Vaping Use: Never used  Substance Use Topics  . Alcohol use: Not Currently  . Drug use: Never    Home Medications Prior to Admission medications   Medication Sig Start Date End Date Taking? Authorizing Provider  acetaminophen (TYLENOL) 325 MG tablet Take 650 mg by mouth every 6 (six) hours as needed for mild pain.     [provider]  amLODipine (NORVASC) 10 MG tablet Take 1 tablet (10 mg total) by mouth daily. 06/23/20 08/02/20  Erline Hau, MD  budesonide (ENTOCORT EC) 3 MG 24 hr capsule TAKE 3  CAPSULES BY MOUTH ONCE DAILY Patient taking differently: Take 3 mg by mouth daily.  04/05/20   [name], [name], MD  buPROPion [facility] SR) 100 MG 12 hr tablet Take 2 tablets by mouth once daily Patient taking differently: Take 200 mg by mouth daily.  07/04/20   [name], [name], MD  calcium carbonate (TUMS - DOSED IN MG ELEMENTAL CALCIUM) 500 MG chewable tablet Chew 1 tablet by mouth 3 (three) times daily as needed for indigestion or heartburn.    [provider]  cholecalciferol (VITAMIN D3) 25 MCG (1000 UT) tablet Take 1,000 Units by mouth daily.     [provider]  cycloSPORINE (RESTASIS) 0.05 % ophthalmic emulsion Place 1 drop into both eyes 2 (two) times daily.    [provider]  dexlansoprazole (DEXILANT) 60 MG capsule Take 1 capsule (60 mg total) by mouth daily. 06/28/20   [name], [name], MD  Dextran 70-Hypromellose 0.1-0.3 % SOLN Place 1 drop into both eyes 4 (four) times daily.    [provider]  dicyclomine (BENTYL) 20 MG tablet Take 20 mg by mouth 3 (three) times daily. 10/26/19   [provider]  diphenoxylate-atropine (LOMOTIL) 2.5-0.025 MG tablet Take 1 tablet by mouth 4 (four) times daily as needed for diarrhea  or loose stools. 05/24/20   [name], [name], MD  DULoxetine (CYMBALTA) 30 MG capsule Take 3 capsules (90 mg total) by mouth daily. 05/24/20   [name], [name], MD  estradiol (ESTRACE) 2 MG tablet Take 1 tablet (2 mg total) by mouth daily. 06/28/20   [name], [name], MD  famotidine (PEPCID) 20 MG tablet Take 20 mg by mouth at bedtime.    [provider]  hydrALAZINE (APRESOLINE) 50 MG tablet Take 1 tablet (50 mg total) by mouth 3 (three) times daily. 06/28/20   [name], [name], MD  lipase/protease/amylase (CREON) 36000 UNITS CPEP capsule Take 1 capsule (36,000 Units total) by mouth 3 (three) times daily as needed (with meals for digestion). 06/28/20   [name], [name], MD  loperamide (IMODIUM) 2 MG capsule TAKE 1 CAPSULE BY MOUTH AS NEEDED FOR DIARRHEA OR  LOOSE  STOOLS Patient taking differently: Take 2 mg by mouth as needed for diarrhea or loose stools.  07/29/20   [name], [name], MD  methadone (DOLOPHINE) 5 MG tablet Take 1 tablet (5 mg total) by mouth 5 (five) times daily. 06/30/20   Florencia Reasons, MD  metoprolol tartrate (LOPRESSOR) 100 MG tablet Take 1 tablet (100 mg total) by mouth 2 (two) times daily. 06/28/20   [name], [name], MD  Multiple Vitamins-Minerals (MULTIVITAMIN ADULT PO) Take 1 tablet by mouth daily.    [provider]  Omega-3 1000 MG CAPS Take 1,000 mg by mouth daily.     [provider]  PRESCRIPTION MEDICATION Inject 1 each into the vein daily. Home TPN . [facility] . 1 bag for 16 hours. [phone]    [provider]  Probiotic Product (PROBIOTIC-10 PO) Take 1 capsule by mouth daily.     [provider]  promethazine (PHENERGAN) 25 MG tablet TAKE ONE TABLET BY MOUTH EVERY 6 HOURS AS NEEDED FOR NAUSEA AND VOMITING 08/26/20   [name], [name], MD  senna-docusate (SENOKOT-S) 8.6-50 MG tablet Take 1 tablet by mouth 2 (two) times daily.  08/12/20   Eugenie Filler, MD  sucralfate (CARAFATE) 1 GM/10ML suspension Take 10 mLs (  1 g total) by mouth 4 (four) times daily -  with meals and at bedtime. 06/28/20   [name], [name], MD  traZODone (DESYREL) 100 MG tablet Take 2 tablets (200 mg total) by mouth at bedtime as needed for sleep. 06/28/20   [name], [name], MD  vitamin B-12 (CYANOCOBALAMIN) 100 MCG tablet Take 1,000 mcg by mouth daily.     [provider]    Allergies    Cefepime, Gabapentin, Hyoscyamine, Lyrica [pregabalin], Meperidine, Topamax [topiramate], Zosyn [piperacillin sod-tazobactam so], Fentanyl, and Morphine and related  Review of Systems   Review of Systems  Constitutional: Positive for chills, fatigue and fever.  Respiratory: Negative for shortness of breath.   Cardiovascular: Negative for chest pain.  Gastrointestinal: Positive for abdominal pain, diarrhea and nausea. Negative for blood in stool, constipation and vomiting.  Genitourinary: Negative for dysuria.  Skin: Negative for color change.  All other systems reviewed and are negative.   Physical Exam Updated Vital Signs BP (!) 129/57   Pulse 69   Temp (!) 103 F (39.4 C) (Rectal)   Resp 13   Ht 1.727 m (5' 8" )   Wt 63.5 kg   SpO2 96%   BMI 21.29 kg/m   Physical Exam Vitals and nursing note reviewed.  Constitutional:      Appearance: She is well-developed. She is not ill-appearing.  HENT:     Head: Normocephalic and atraumatic.     Nose: Nose normal.     Mouth/Throat:     Mouth: Mucous membranes are moist.  Eyes:     Pupils: Pupils are equal, round, and reactive to light.  Cardiovascular:     Rate and Rhythm: Normal rate and regular rhythm.     Heart sounds: Normal heart sounds.  Pulmonary:     Effort: Pulmonary effort is normal. No respiratory distress.     Breath sounds: No wheezing.  Abdominal:     General: Bowel sounds are normal.     Palpations: Abdomen is soft.     Tenderness: There is no  abdominal tenderness.     Comments: Extensive midline abdominal scarring, no significant tenderness, rebound or guarding  Musculoskeletal:     Cervical back: Neck supple.     Comments: PICC line left femoral region, no adjacent skin changes, fluctuance, induration  Skin:    General: Skin is warm and dry.  Neurological:     Mental Status: She is alert and oriented to person, place, and time.  Psychiatric:        Mood and Affect: Mood normal.     ED Results / Procedures / Treatments   Labs (all labs ordered are listed, but only abnormal results are displayed) Labs Reviewed  CBC WITH DIFFERENTIAL/PLATELET - Abnormal; Notable for the following components:      Result Value   WBC 1.7 (*)    RBC 3.36 (*)    Hemoglobin 10.2 (*)    HCT 30.7 (*)    Platelets 125 (*)    Neutro Abs 1.5 (*)    Lymphs Abs 0.2 (*)    Monocytes Absolute 0.0 (*)    All other components within normal limits  COMPREHENSIVE METABOLIC PANEL - Abnormal; Notable for the following components:   BUN 33 (*)    Creatinine, Ser 1.42 (*)    Albumin 2.9 (*)    AST 973 (*)    ALT 573 (*)    Alkaline Phosphatase 219 (*)    GFR calc non Af Amer 40 (*)  All other components within normal limits  URINALYSIS, ROUTINE W REFLEX MICROSCOPIC - Abnormal; Notable for the following components:   Specific Gravity, Urine 1.041 (*)    Protein, ur 30 (*)    All other components within normal limits  RESPIRATORY PANEL BY RT PCR (FLU A&B, COVID)  CULTURE, BLOOD (ROUTINE X 2)  CULTURE, BLOOD (ROUTINE X 2)  URINE CULTURE  LACTIC ACID, PLASMA  LACTIC ACID, PLASMA  PROTIME-INR  APTT  ACETAMINOPHEN LEVEL  ETHANOL  HEPATITIS PANEL, ACUTE  I-STAT BETA HCG BLOOD, ED ([facility], [facility], [facility] ONLY)    EKG None  Radiology CT ABDOMEN PELVIS W CONTRAST  Result Date: 08/31/2020 CLINICAL DATA:  Abdominal pain fever EXAM: CT ABDOMEN AND PELVIS WITH CONTRAST TECHNIQUE: Multidetector CT imaging of the abdomen and pelvis was performed using the  standard protocol following bolus administration of intravenous contrast. CONTRAST:  182m OMNIPAQUE IOHEXOL 300 MG/ML  SOLN COMPARISON:  August 09, 2020 FINDINGS: Lower chest: The visualized heart size within normal limits. No pericardial fluid/thickening. No hiatal hernia. The visualized portions of the lungs are clear. Hepatobiliary: The liver is normal in density without focal abnormality.The main portal vein is patent. Again noted is pneumobilia likely from prior postsurgical change. Mild common bile duct and intrahepatic biliary ductal dilatation is again noted. Pancreas: There is unchanged pancreatic ductal dilatation. No pancreatic ductal dilatation or surrounding inflammatory changes. Spleen: Normal in size without focal abnormality. Adrenals/Urinary Tract: Both adrenal glands appear normal. The kidneys and collecting system appear normal without evidence of urinary tract calculus or hydronephrosis. Bladder is unremarkable. Stomach/Bowel: The stomach and small bowel are grossly unremarkable. Surgical colonic anastomosis is again identified within the lower abdomen. There is mildly prominent fluid and air-filled loops of colon within the left lower quadrant within the sigmoid colon. There is however apparent wall thickening of the distal sigmoid colon and rectum without surrounding fat stranding changes. Within the mid descending colon again identified is a possible area of focal narrowing which is nonspecific, series 4, image 70. Vascular/Lymphatic: There are no enlarged mesenteric, retroperitoneal, or pelvic lymph nodes. Again noted is a right femoral venous catheter in unchanged position. Reproductive: Scattered aortic atherosclerotic calcifications are seen without aneurysmal dilatation. Other: No evidence of abdominal wall mass or hernia. Musculoskeletal: No acute or significant osseous findings. IMPRESSION: Unchanged pneumobilia and mild common bile duct and pancreatic ductal dilatation.  Postsurgical changes within the colon with a mildly prominent fluid and air-filled sigmoid colon. There is apparent wall thickening of the distal sigmoid colon and rectum which may be due to underdistention versus mild colitis. As on prior exam again noted is a focal area of narrowing within the distal descending colon which could be due to colitis versus underlying mass lesion. On resolution of symptoms would recommend direct visualization. Aortic Atherosclerosis (ICD10-I70.0). Electronically Signed   By: BPrudencio PairM.D.   On: 08/31/2020 01:45   DG Chest Portable 1 View  Result Date: 08/30/2020 CLINICAL DATA:  Shortness of breath EXAM: PORTABLE CHEST 1 VIEW COMPARISON:  08/02/2020 FINDINGS: Cardiac shadow is stable. Postsurgical changes are noted on right. No focal infiltrate or sizable effusion is seen. No bony abnormality is noted. IMPRESSION: No acute abnormality noted. Electronically Signed   By: MInez CatalinaM.D.   On: 08/30/2020 23:19    Procedures Procedures (including critical care time)  CRITICAL CARE Performed by: CMerryl Hacker  Total critical care time: 35 minutes  Critical care time was exclusive of separately billable procedures and treating other  patients.  Critical care was necessary to treat or prevent imminent or life-threatening deterioration.  Critical care was time spent personally by me on the following activities: development of treatment plan with patient and/or surrogate as well as nursing, discussions with consultants, evaluation of patient's response to treatment, examination of patient, obtaining history from patient or surrogate, ordering and performing treatments and interventions, ordering and review of laboratory studies, ordering and review of radiographic studies, pulse oximetry and re-evaluation of patient's condition.   Medications Ordered in ED Medications  HYDROmorphone (DILAUDID) injection 1 mg (has no administration in time range)  lactated  ringers bolus 1,000 mL (0 mLs Intravenous Stopped 08/31/20 0121)  HYDROmorphone (DILAUDID) injection 1 mg (1 mg Intravenous Given 08/31/20 0121)  ondansetron (ZOFRAN) injection 4 mg (4 mg Intravenous Given 08/31/20 0121)  sodium chloride (PF) 0.9 % injection (  Given by Other 08/31/20 0123)  iohexol (OMNIPAQUE) 300 MG/ML solution 100 mL (100 mLs Intravenous Contrast Given 08/31/20 0122)    ED Course  I have reviewed the triage vital signs and the nursing notes.  Pertinent labs & imaging results that were available during my care of the patient were reviewed by me and considered in my medical decision making (see chart for details).    MDM Rules/Calculators/A&P                          Patient presents today with fever.  She does report some abdominal pain but reports that some of this may be chronic in nature.  She is overall nontoxic-appearing and vital signs are notable for temperature of 103.  Sepsis work-up initiated.  She denies URI symptoms or urinary symptoms.  She denies any known Covid exposures.  Chest x-ray obtained and shows no evidence of pneumothorax or pneumonia.  Initial lab work is notable for mild leukopenia which she has had in the past.  Additionally she has elevated LFTs with an AST of 973, ALT of 573, alk phos of 219.  On further questioning, patient reports that she knows that her LFTs are elevated and they have been monitored by her TPN clinic.  She states that it is related to her TPN and she has an appointment later today for reevaluation.  Patient denies any ongoing Tylenol use.  She does not appear to be in shock.  Lactate is normal.  Covid and flu screening are negative.  Urinalysis without obvious infectious source.  Given fever and extensive abdominal history with abdominal pain, will obtain CT abdomen pelvis to rule out intra-abdominal infection.  CT scan is largely unchanged.  She does have some mild thickening of her sigmoid colon which could be due to underdistention or  mild colitis.  Highly suspicious that this is inflammatory in nature.  She already has follow-up regarding narrowed segments of her distal colon.  Patient has remained hemodynamically stable.  No recurrent fevers.  She does not appear septic.  Only significant objective finding is elevated LFTs which the patient was aware of.  I am unable to see her most recent LFT values in care everywhere.  Only additional potential source of infection will be her PICC line although it is not overtly infected and the skin is clean and dry without erythema.  Blood cultures are pending.  Patient would like to be discharged home.  Feel this is reasonable given that she has close follow-up.  If blood cultures return positive, she will need PICC line removal.  Patient stated understanding.  She was also given strict return precautions.  After history, exam, and medical workup I feel the patient has been appropriately medically screened and is safe for discharge home. Pertinent diagnoses were discussed with the patient. Patient was given return precautions.  Final Clinical Impression(s) / ED Diagnoses Final diagnoses:  Fever, unspecified fever cause  Elevated LFTs  Generalized abdominal pain    Rx / DC Orders ED Discharge Orders    None       Merryl Hacker, MD 08/31/20 [phone]

## 2020-08-30 NOTE — ED Triage Notes (Signed)
Pt from home  C/o fever and generalized malaise onset today 102 @ 20:00 tonight. Hx sepsis multiple times since 2003 and seen at [facility] and [facility] mulitple times for same

## 2020-08-31 ENCOUNTER — Telehealth (HOSPITAL_BASED_OUTPATIENT_CLINIC_OR_DEPARTMENT_OTHER): Payer: Self-pay | Admitting: Emergency Medicine

## 2020-08-31 ENCOUNTER — Encounter (HOSPITAL_COMMUNITY): Payer: Self-pay

## 2020-08-31 ENCOUNTER — Inpatient Hospital Stay (HOSPITAL_COMMUNITY)
Admission: EM | Admit: 2020-08-31 | Discharge: 2020-09-12 | DRG: 314 | Disposition: A | Discharge status: Home Health | Payer: Medicare Other | Attending: Internal Medicine | Admitting: Internal Medicine

## 2020-08-31 ENCOUNTER — Other Ambulatory Visit: Payer: Self-pay

## 2020-08-31 ENCOUNTER — Emergency Department (HOSPITAL_COMMUNITY): Payer: Medicare Other

## 2020-08-31 DIAGNOSIS — D696 Thrombocytopenia, unspecified: Secondary | ICD-10-CM | POA: Diagnosis present

## 2020-08-31 DIAGNOSIS — N179 Acute kidney failure, unspecified: Secondary | ICD-10-CM | POA: Diagnosis present

## 2020-08-31 DIAGNOSIS — K50818 Crohn's disease of both small and large intestine with other complication: Secondary | ICD-10-CM | POA: Diagnosis present

## 2020-08-31 DIAGNOSIS — R945 Abnormal results of liver function studies: Secondary | ICD-10-CM | POA: Diagnosis present

## 2020-08-31 DIAGNOSIS — Z881 Allergy status to other antibiotic agents status: Secondary | ICD-10-CM

## 2020-08-31 DIAGNOSIS — Z885 Allergy status to narcotic agent status: Secondary | ICD-10-CM

## 2020-08-31 DIAGNOSIS — Z8719 Personal history of other diseases of the digestive system: Secondary | ICD-10-CM

## 2020-08-31 DIAGNOSIS — Y848 Other medical procedures as the cause of abnormal reaction of the patient, or of later complication, without mention of misadventure at the time of the procedure: Secondary | ICD-10-CM | POA: Diagnosis present

## 2020-08-31 DIAGNOSIS — Z8249 Family history of ischemic heart disease and other diseases of the circulatory system: Secondary | ICD-10-CM

## 2020-08-31 DIAGNOSIS — R109 Unspecified abdominal pain: Secondary | ICD-10-CM

## 2020-08-31 DIAGNOSIS — T827XXA Infection and inflammatory reaction due to other cardiac and vascular devices, implants and grafts, initial encounter: Secondary | ICD-10-CM

## 2020-08-31 DIAGNOSIS — D649 Anemia, unspecified: Secondary | ICD-10-CM | POA: Diagnosis present

## 2020-08-31 DIAGNOSIS — A419 Sepsis, unspecified organism: Secondary | ICD-10-CM | POA: Diagnosis present

## 2020-08-31 DIAGNOSIS — Z789 Other specified health status: Secondary | ICD-10-CM | POA: Diagnosis present

## 2020-08-31 DIAGNOSIS — Z978 Presence of other specified devices: Secondary | ICD-10-CM

## 2020-08-31 DIAGNOSIS — N1832 Chronic kidney disease, stage 3b: Secondary | ICD-10-CM | POA: Diagnosis present

## 2020-08-31 DIAGNOSIS — Z7952 Long term (current) use of systemic steroids: Secondary | ICD-10-CM

## 2020-08-31 DIAGNOSIS — Z79899 Other long term (current) drug therapy: Secondary | ICD-10-CM

## 2020-08-31 DIAGNOSIS — K5090 Crohn's disease, unspecified, without complications: Secondary | ICD-10-CM | POA: Diagnosis present

## 2020-08-31 DIAGNOSIS — K56609 Unspecified intestinal obstruction, unspecified as to partial versus complete obstruction: Secondary | ICD-10-CM

## 2020-08-31 DIAGNOSIS — Z9049 Acquired absence of other specified parts of digestive tract: Secondary | ICD-10-CM

## 2020-08-31 DIAGNOSIS — Z833 Family history of diabetes mellitus: Secondary | ICD-10-CM

## 2020-08-31 DIAGNOSIS — Z20822 Contact with and (suspected) exposure to covid-19: Secondary | ICD-10-CM | POA: Diagnosis present

## 2020-08-31 DIAGNOSIS — K50819 Crohn's disease of both small and large intestine with unspecified complications: Secondary | ICD-10-CM | POA: Diagnosis present

## 2020-08-31 DIAGNOSIS — A498 Other bacterial infections of unspecified site: Secondary | ICD-10-CM

## 2020-08-31 DIAGNOSIS — A4159 Other Gram-negative sepsis: Secondary | ICD-10-CM | POA: Diagnosis present

## 2020-08-31 DIAGNOSIS — Z880 Allergy status to penicillin: Secondary | ICD-10-CM

## 2020-08-31 DIAGNOSIS — G894 Chronic pain syndrome: Secondary | ICD-10-CM | POA: Diagnosis present

## 2020-08-31 DIAGNOSIS — K90829 Short bowel syndrome, unspecified: Secondary | ICD-10-CM | POA: Diagnosis present

## 2020-08-31 DIAGNOSIS — R7989 Other specified abnormal findings of blood chemistry: Secondary | ICD-10-CM | POA: Diagnosis present

## 2020-08-31 DIAGNOSIS — K5010 Crohn's disease of large intestine without complications: Secondary | ICD-10-CM | POA: Diagnosis present

## 2020-08-31 DIAGNOSIS — Z87891 Personal history of nicotine dependence: Secondary | ICD-10-CM

## 2020-08-31 DIAGNOSIS — R7881 Bacteremia: Secondary | ICD-10-CM

## 2020-08-31 DIAGNOSIS — Z888 Allergy status to other drugs, medicaments and biological substances status: Secondary | ICD-10-CM

## 2020-08-31 DIAGNOSIS — D708 Other neutropenia: Secondary | ICD-10-CM | POA: Diagnosis present

## 2020-08-31 DIAGNOSIS — I129 Hypertensive chronic kidney disease with stage 1 through stage 4 chronic kidney disease, or unspecified chronic kidney disease: Secondary | ICD-10-CM | POA: Diagnosis present

## 2020-08-31 DIAGNOSIS — F419 Anxiety disorder, unspecified: Secondary | ICD-10-CM | POA: Diagnosis present

## 2020-08-31 DIAGNOSIS — K912 Postsurgical malabsorption, not elsewhere classified: Secondary | ICD-10-CM | POA: Diagnosis present

## 2020-08-31 DIAGNOSIS — T80211A Bloodstream infection due to central venous catheter, initial encounter: Principal | ICD-10-CM | POA: Diagnosis present

## 2020-08-31 DIAGNOSIS — K219 Gastro-esophageal reflux disease without esophagitis: Secondary | ICD-10-CM | POA: Diagnosis present

## 2020-08-31 DIAGNOSIS — F329 Major depressive disorder, single episode, unspecified: Secondary | ICD-10-CM | POA: Diagnosis present

## 2020-08-31 LAB — CBC WITH DIFFERENTIAL/PLATELET
Abs Immature Granulocytes: 0.02 10*3/uL (ref 0.00–0.07)
Basophils Absolute: 0 10*3/uL (ref 0.0–0.1)
Basophils Relative: 0 %
Eosinophils Absolute: 0 10*3/uL (ref 0.0–0.5)
Eosinophils Relative: 0 %
HCT: 30.7 % — ABNORMAL LOW (ref 36.0–46.0)
Hemoglobin: 10.2 g/dL — ABNORMAL LOW (ref 12.0–15.0)
Immature Granulocytes: 1 %
Lymphocytes Relative: 9 %
Lymphs Abs: 0.2 10*3/uL — ABNORMAL LOW (ref 0.7–4.0)
MCH: 30.4 pg (ref 26.0–34.0)
MCHC: 33.2 g/dL (ref 30.0–36.0)
MCV: 91.4 fL (ref 80.0–100.0)
Monocytes Absolute: 0 10*3/uL — ABNORMAL LOW (ref 0.1–1.0)
Monocytes Relative: 2 %
Neutro Abs: 1.5 10*3/uL — ABNORMAL LOW (ref 1.7–7.7)
Neutrophils Relative %: 88 %
Platelets: 125 10*3/uL — ABNORMAL LOW (ref 150–400)
RBC: 3.36 MIL/uL — ABNORMAL LOW (ref 3.87–5.11)
RDW: 14.1 % (ref 11.5–15.5)
WBC: 1.7 10*3/uL — ABNORMAL LOW (ref 4.0–10.5)
nRBC: 0 % (ref 0.0–0.2)

## 2020-08-31 LAB — URINALYSIS, ROUTINE W REFLEX MICROSCOPIC
Bacteria, UA: NONE SEEN
Bilirubin Urine: NEGATIVE
Glucose, UA: NEGATIVE mg/dL
Hgb urine dipstick: NEGATIVE
Ketones, ur: NEGATIVE mg/dL
Leukocytes,Ua: NEGATIVE
Nitrite: NEGATIVE
Protein, ur: 30 mg/dL — AB
Specific Gravity, Urine: 1.041 — ABNORMAL HIGH (ref 1.005–1.030)
pH: 6 (ref 5.0–8.0)

## 2020-08-31 LAB — COMPREHENSIVE METABOLIC PANEL
ALT: 573 U/L — ABNORMAL HIGH (ref 0–44)
AST: 973 U/L — ABNORMAL HIGH (ref 15–41)
Albumin: 2.9 g/dL — ABNORMAL LOW (ref 3.5–5.0)
Alkaline Phosphatase: 219 U/L — ABNORMAL HIGH (ref 38–126)
Anion gap: 11 (ref 5–15)
BUN: 33 mg/dL — ABNORMAL HIGH (ref 6–20)
CO2: 26 mmol/L (ref 22–32)
Calcium: 9.2 mg/dL (ref 8.9–10.3)
Chloride: 102 mmol/L (ref 98–111)
Creatinine, Ser: 1.42 mg/dL — ABNORMAL HIGH (ref 0.44–1.00)
GFR calc non Af Amer: 40 mL/min — ABNORMAL LOW (ref >60)
Glucose, Bld: 83 mg/dL (ref 70–99)
Potassium: 3.7 mmol/L (ref 3.5–5.1)
Sodium: 139 mmol/L (ref 135–145)
Total Bilirubin: 0.9 mg/dL (ref 0.3–1.2)
Total Protein: 6.7 g/dL (ref 6.5–8.1)

## 2020-08-31 LAB — BLOOD CULTURE ID PANEL (REFLEXED) - BCID2
A.calcoaceticus-baumannii: NOT DETECTED
Bacteroides fragilis: NOT DETECTED
Candida albicans: NOT DETECTED
Candida auris: NOT DETECTED
Candida glabrata: NOT DETECTED
Candida krusei: NOT DETECTED
Candida parapsilosis: NOT DETECTED
Candida tropicalis: NOT DETECTED
Cryptococcus neoformans/gattii: NOT DETECTED
Enterobacter cloacae complex: NOT DETECTED
Enterobacterales: NOT DETECTED
Enterococcus Faecium: NOT DETECTED
Enterococcus faecalis: NOT DETECTED
Escherichia coli: NOT DETECTED
Haemophilus influenzae: NOT DETECTED
Klebsiella aerogenes: NOT DETECTED
Klebsiella oxytoca: NOT DETECTED
Klebsiella pneumoniae: NOT DETECTED
Listeria monocytogenes: NOT DETECTED
Neisseria meningitidis: NOT DETECTED
Proteus species: NOT DETECTED
Pseudomonas aeruginosa: NOT DETECTED
Salmonella species: NOT DETECTED
Serratia marcescens: NOT DETECTED
Staphylococcus aureus (BCID): NOT DETECTED
Staphylococcus epidermidis: NOT DETECTED
Staphylococcus lugdunensis: NOT DETECTED
Staphylococcus species: NOT DETECTED
Stenotrophomonas maltophilia: DETECTED — AB
Streptococcus agalactiae: NOT DETECTED
Streptococcus pneumoniae: NOT DETECTED
Streptococcus pyogenes: NOT DETECTED
Streptococcus species: NOT DETECTED

## 2020-08-31 LAB — LACTIC ACID, PLASMA: Lactic Acid, Venous: 1.2 mmol/L (ref 0.5–1.9)

## 2020-08-31 LAB — RESPIRATORY PANEL BY RT PCR (FLU A&B, COVID)
Influenza A by PCR: NEGATIVE
Influenza B by PCR: NEGATIVE
SARS Coronavirus 2 by RT PCR: NEGATIVE

## 2020-08-31 MED ORDER — HYDROMORPHONE HCL 1 MG/ML IJ SOLN
1.0000 mg | Freq: Once | INTRAMUSCULAR | Status: AC
  Administered 2020-09-01: 1 mg via INTRAVENOUS
  Filled 2020-08-31: qty 1

## 2020-08-31 MED ORDER — HYDROMORPHONE HCL 1 MG/ML IJ SOLN
1.0000 mg | Freq: Once | INTRAMUSCULAR | Status: AC
  Administered 2020-08-31: 1 mg via INTRAVENOUS
  Filled 2020-08-31: qty 1

## 2020-08-31 MED ORDER — LACTATED RINGERS IV SOLN: INTRAVENOUS | Status: DC

## 2020-08-31 MED ORDER — ONDANSETRON HCL 4 MG/2ML IJ SOLN
4.0000 mg | Freq: Once | INTRAMUSCULAR | Status: AC
  Administered 2020-08-31: 4 mg via INTRAVENOUS
  Filled 2020-08-31: qty 2

## 2020-08-31 MED ORDER — SULFAMETHOXAZOLE-TRIMETHOPRIM 400-80 MG/5ML IV SOLN
15.0000 mg/kg/d | Freq: Three times a day (TID) | INTRAVENOUS | Status: DC
  Administered 2020-09-01 (×2): 317.44 mg via INTRAVENOUS
  Filled 2020-08-31 (×2): qty 19.84

## 2020-08-31 MED ORDER — SODIUM CHLORIDE (PF) 0.9 % IJ SOLN
INTRAMUSCULAR | Status: AC
  Filled 2020-08-31: qty 50

## 2020-08-31 MED ORDER — IOHEXOL 300 MG/ML  SOLN
100.0000 mL | Freq: Once | INTRAMUSCULAR | Status: AC | PRN
  Administered 2020-08-31: 100 mL via INTRAVENOUS

## 2020-08-31 NOTE — Discharge Instructions (Addendum)
You were seen today for fever and generalized abdominal pain.  There is no obvious source of your fever.  Blood cultures are pending.  If these return positive, you may need to have your PICC line removed.  Your LFTs were also notably elevated.  This may be related to your ongoing TPN.  Follow-up with your TPN clinic as scheduled later today for reevaluation.  If you develop any new or worsening symptoms you should be reevaluated.

## 2020-08-31 NOTE — Progress Notes (Signed)
Pharmacy Antibiotic Note  Caitlin Peters is a 59 y.o. female admitted on 08/31/2020 with history of Crohn's disease, CKD stage III, AVN, short gut syndrome, and HTN.  Advised to return to hospital for positive blood cultures.  Pharmacy has been consulted for bactrim dosing.  Plan: Bactrim 36m/ kg/dose q8h Follow renal function, cultures and clinical course     Temp (24hrs), Avg:98.2 F (36.8 C), Min:97.9 F (36.6 C), Max:98.4 F (36.9 C)  Recent Labs  Lab 08/30/20 2249 08/30/20 2315  WBC 1.7*  --   CREATININE 1.42*  --   LATICACIDVEN  --  1.2    Estimated Creatinine Clearance: 42.8 mL/min (A) (by C-G formula based on SCr of 1.42 mg/dL (H)).    Allergies  Allergen Reactions  . Cefepime Other (See Comments)    Neurotoxicity occurring in setting of AKI. Ceftriaxone tolerated during same admit  . Gabapentin Other (See Comments)    unknown  . Hyoscyamine Hives and Swelling    Legs swelling   Disorientation  . Lyrica [Pregabalin] Other (See Comments)    unknown  . Meperidine Hives    Other reaction(s): GI Upset Due to [place]   . Topamax [Topiramate] Other (See Comments)    unknown  . Zosyn [Piperacillin Sod-Tazobactam So]     Patient reports it makes her vomit, her neck stiff, and her "heart feel funny"  . Fentanyl Rash    Pt is allergic to fentanyl patch related to the glue (gives her a rash) Pt states she is NOT allergic to fentanyl IV medicine  . Morphine And Related Rash     Thank you for allowing pharmacy to be a part of this patient's care.  EDolly RiasRPh 09/01/2020, 12:00 AM

## 2020-08-31 NOTE — ED Triage Notes (Signed)
Patient arrived stating she has had a fever and is prone to sepsis. Complaints of abdominal pain, states this is a chronic issue but reporting no other symptoms at this time. Reports NVD last week but has resolved.

## 2020-09-01 ENCOUNTER — Encounter (HOSPITAL_COMMUNITY): Payer: Self-pay | Admitting: Internal Medicine

## 2020-09-01 DIAGNOSIS — I129 Hypertensive chronic kidney disease with stage 1 through stage 4 chronic kidney disease, or unspecified chronic kidney disease: Secondary | ICD-10-CM | POA: Diagnosis present

## 2020-09-01 DIAGNOSIS — Z20822 Contact with and (suspected) exposure to covid-19: Secondary | ICD-10-CM | POA: Diagnosis present

## 2020-09-01 DIAGNOSIS — N179 Acute kidney failure, unspecified: Secondary | ICD-10-CM | POA: Diagnosis present

## 2020-09-01 DIAGNOSIS — Z833 Family history of diabetes mellitus: Secondary | ICD-10-CM | POA: Diagnosis not present

## 2020-09-01 DIAGNOSIS — T827XXD Infection and inflammatory reaction due to other cardiac and vascular devices, implants and grafts, subsequent encounter: Secondary | ICD-10-CM | POA: Diagnosis not present

## 2020-09-01 DIAGNOSIS — T80211A Bloodstream infection due to central venous catheter, initial encounter: Secondary | ICD-10-CM | POA: Diagnosis present

## 2020-09-01 DIAGNOSIS — A419 Sepsis, unspecified organism: Secondary | ICD-10-CM

## 2020-09-01 DIAGNOSIS — R7989 Other specified abnormal findings of blood chemistry: Secondary | ICD-10-CM | POA: Diagnosis present

## 2020-09-01 DIAGNOSIS — Z79899 Other long term (current) drug therapy: Secondary | ICD-10-CM | POA: Diagnosis not present

## 2020-09-01 DIAGNOSIS — Z87891 Personal history of nicotine dependence: Secondary | ICD-10-CM | POA: Diagnosis not present

## 2020-09-01 DIAGNOSIS — N1832 Chronic kidney disease, stage 3b: Secondary | ICD-10-CM | POA: Diagnosis present

## 2020-09-01 DIAGNOSIS — Z8249 Family history of ischemic heart disease and other diseases of the circulatory system: Secondary | ICD-10-CM | POA: Diagnosis not present

## 2020-09-01 DIAGNOSIS — K5090 Crohn's disease, unspecified, without complications: Secondary | ICD-10-CM | POA: Diagnosis present

## 2020-09-01 DIAGNOSIS — A4159 Other Gram-negative sepsis: Secondary | ICD-10-CM | POA: Diagnosis present

## 2020-09-01 DIAGNOSIS — Z888 Allergy status to other drugs, medicaments and biological substances status: Secondary | ICD-10-CM | POA: Diagnosis not present

## 2020-09-01 DIAGNOSIS — Z789 Other specified health status: Secondary | ICD-10-CM | POA: Diagnosis not present

## 2020-09-01 DIAGNOSIS — R109 Unspecified abdominal pain: Secondary | ICD-10-CM | POA: Diagnosis not present

## 2020-09-01 DIAGNOSIS — D696 Thrombocytopenia, unspecified: Secondary | ICD-10-CM | POA: Diagnosis present

## 2020-09-01 DIAGNOSIS — D649 Anemia, unspecified: Secondary | ICD-10-CM | POA: Diagnosis present

## 2020-09-01 DIAGNOSIS — R7881 Bacteremia: Secondary | ICD-10-CM | POA: Diagnosis present

## 2020-09-01 DIAGNOSIS — Z881 Allergy status to other antibiotic agents status: Secondary | ICD-10-CM | POA: Diagnosis not present

## 2020-09-01 DIAGNOSIS — K50119 Crohn's disease of large intestine with unspecified complications: Secondary | ICD-10-CM | POA: Diagnosis not present

## 2020-09-01 DIAGNOSIS — A498 Other bacterial infections of unspecified site: Secondary | ICD-10-CM | POA: Diagnosis not present

## 2020-09-01 DIAGNOSIS — Y848 Other medical procedures as the cause of abnormal reaction of the patient, or of later complication, without mention of misadventure at the time of the procedure: Secondary | ICD-10-CM | POA: Diagnosis present

## 2020-09-01 DIAGNOSIS — Z7952 Long term (current) use of systemic steroids: Secondary | ICD-10-CM | POA: Diagnosis not present

## 2020-09-01 DIAGNOSIS — Z8719 Personal history of other diseases of the digestive system: Secondary | ICD-10-CM | POA: Diagnosis not present

## 2020-09-01 DIAGNOSIS — G894 Chronic pain syndrome: Secondary | ICD-10-CM | POA: Diagnosis present

## 2020-09-01 DIAGNOSIS — K5010 Crohn's disease of large intestine without complications: Secondary | ICD-10-CM | POA: Diagnosis not present

## 2020-09-01 DIAGNOSIS — K912 Postsurgical malabsorption, not elsewhere classified: Secondary | ICD-10-CM | POA: Diagnosis present

## 2020-09-01 DIAGNOSIS — Z885 Allergy status to narcotic agent status: Secondary | ICD-10-CM | POA: Diagnosis not present

## 2020-09-01 DIAGNOSIS — F419 Anxiety disorder, unspecified: Secondary | ICD-10-CM | POA: Diagnosis present

## 2020-09-01 DIAGNOSIS — K50819 Crohn's disease of both small and large intestine with unspecified complications: Secondary | ICD-10-CM

## 2020-09-01 DIAGNOSIS — K219 Gastro-esophageal reflux disease without esophagitis: Secondary | ICD-10-CM | POA: Diagnosis present

## 2020-09-01 DIAGNOSIS — F329 Major depressive disorder, single episode, unspecified: Secondary | ICD-10-CM | POA: Diagnosis present

## 2020-09-01 LAB — COMPREHENSIVE METABOLIC PANEL
ALT: 544 U/L — ABNORMAL HIGH (ref 0–44)
ALT: 458 U/L — ABNORMAL HIGH (ref 0–44)
AST: 326 U/L — ABNORMAL HIGH (ref 15–41)
AST: 259 U/L — ABNORMAL HIGH (ref 15–41)
Albumin: 3 g/dL — ABNORMAL LOW (ref 3.5–5.0)
Albumin: 2.8 g/dL — ABNORMAL LOW (ref 3.5–5.0)
Alkaline Phosphatase: 232 U/L — ABNORMAL HIGH (ref 38–126)
Alkaline Phosphatase: 211 U/L — ABNORMAL HIGH (ref 38–126)
Anion gap: 8 (ref 5–15)
Anion gap: 11 (ref 5–15)
BUN: 35 mg/dL — ABNORMAL HIGH (ref 6–20)
BUN: 27 mg/dL — ABNORMAL HIGH (ref 6–20)
CO2: 25 mmol/L (ref 22–32)
CO2: 22 mmol/L (ref 22–32)
Calcium: 9.3 mg/dL (ref 8.9–10.3)
Calcium: 9.3 mg/dL (ref 8.9–10.3)
Chloride: 105 mmol/L (ref 98–111)
Chloride: 103 mmol/L (ref 98–111)
Creatinine, Ser: 1.76 mg/dL — ABNORMAL HIGH (ref 0.44–1.00)
Creatinine, Ser: 1.58 mg/dL — ABNORMAL HIGH (ref 0.44–1.00)
GFR calc non Af Amer: 31 mL/min — ABNORMAL LOW (ref >60)
GFR calc non Af Amer: 35 mL/min — ABNORMAL LOW (ref >60)
Glucose, Bld: 87 mg/dL (ref 70–99)
Glucose, Bld: 97 mg/dL (ref 70–99)
Potassium: 3.8 mmol/L (ref 3.5–5.1)
Potassium: 3.6 mmol/L (ref 3.5–5.1)
Sodium: 138 mmol/L (ref 135–145)
Sodium: 136 mmol/L (ref 135–145)
Total Bilirubin: 1 mg/dL (ref 0.3–1.2)
Total Bilirubin: 1 mg/dL (ref 0.3–1.2)
Total Protein: 6.9 g/dL (ref 6.5–8.1)
Total Protein: 6.5 g/dL (ref 6.5–8.1)

## 2020-09-01 LAB — CBC WITH DIFFERENTIAL/PLATELET
Abs Immature Granulocytes: 0.02 10*3/uL (ref 0.00–0.07)
Basophils Absolute: 0 10*3/uL (ref 0.0–0.1)
Basophils Relative: 0 %
Eosinophils Absolute: 0.1 10*3/uL (ref 0.0–0.5)
Eosinophils Relative: 2 %
HCT: 24.3 % — ABNORMAL LOW (ref 36.0–46.0)
Hemoglobin: 8 g/dL — ABNORMAL LOW (ref 12.0–15.0)
Immature Granulocytes: 0 %
Lymphocytes Relative: 16 %
Lymphs Abs: 1 10*3/uL (ref 0.7–4.0)
MCH: 30.8 pg (ref 26.0–34.0)
MCHC: 32.9 g/dL (ref 30.0–36.0)
MCV: 93.5 fL (ref 80.0–100.0)
Monocytes Absolute: 0.4 10*3/uL (ref 0.1–1.0)
Monocytes Relative: 6 %
Neutro Abs: 4.8 10*3/uL (ref 1.7–7.7)
Neutrophils Relative %: 76 %
Platelets: 119 10*3/uL — ABNORMAL LOW (ref 150–400)
RBC: 2.6 MIL/uL — ABNORMAL LOW (ref 3.87–5.11)
RDW: 14.6 % (ref 11.5–15.5)
WBC: 6.3 10*3/uL (ref 4.0–10.5)
nRBC: 0 % (ref 0.0–0.2)

## 2020-09-01 LAB — MAGNESIUM: Magnesium: 1.7 mg/dL (ref 1.7–2.4)

## 2020-09-01 LAB — RESPIRATORY PANEL BY RT PCR (FLU A&B, COVID)
Influenza A by PCR: NEGATIVE
Influenza B by PCR: NEGATIVE
SARS Coronavirus 2 by RT PCR: NEGATIVE

## 2020-09-01 LAB — GLUCOSE, CAPILLARY
Glucose-Capillary: 89 mg/dL (ref 70–99)
Glucose-Capillary: 65 mg/dL — ABNORMAL LOW (ref 70–99)
Glucose-Capillary: 72 mg/dL (ref 70–99)
Glucose-Capillary: 94 mg/dL (ref 70–99)

## 2020-09-01 LAB — APTT: aPTT: 25 seconds (ref 24–36)

## 2020-09-01 LAB — LACTIC ACID, PLASMA: Lactic Acid, Venous: 0.5 mmol/L (ref 0.5–1.9)

## 2020-09-01 LAB — CBC
HCT: 25 % — ABNORMAL LOW (ref 36.0–46.0)
Hemoglobin: 7.8 g/dL — ABNORMAL LOW (ref 12.0–15.0)
MCH: 30.2 pg (ref 26.0–34.0)
MCHC: 31.2 g/dL (ref 30.0–36.0)
MCV: 96.9 fL (ref 80.0–100.0)
Platelets: 94 10*3/uL — ABNORMAL LOW (ref 150–400)
RBC: 2.58 MIL/uL — ABNORMAL LOW (ref 3.87–5.11)
RDW: 14.5 % (ref 11.5–15.5)
WBC: 4.7 10*3/uL (ref 4.0–10.5)
nRBC: 0 % (ref 0.0–0.2)

## 2020-09-01 LAB — PHOSPHORUS: Phosphorus: 2.9 mg/dL (ref 2.5–4.6)

## 2020-09-01 LAB — PROTIME-INR
INR: 1 (ref 0.8–1.2)
Prothrombin Time: 13 seconds (ref 11.4–15.2)

## 2020-09-01 MED ORDER — ESTRADIOL 1 MG PO TABS
2.0000 mg | ORAL_TABLET | Freq: Every day | ORAL | Status: DC
  Administered 2020-09-01 – 2020-09-12 (×11): 2 mg via ORAL
  Filled 2020-09-01 (×12): qty 2

## 2020-09-01 MED ORDER — VITAMIN D 25 MCG (1000 UNIT) PO TABS
1000.0000 [IU] | ORAL_TABLET | Freq: Every day | ORAL | Status: DC
  Administered 2020-09-02 – 2020-09-05 (×4): 1000 [IU] via ORAL
  Filled 2020-09-01 (×7): qty 1

## 2020-09-01 MED ORDER — HYDRALAZINE HCL 50 MG PO TABS
50.0000 mg | ORAL_TABLET | Freq: Three times a day (TID) | ORAL | Status: DC
  Administered 2020-09-01 – 2020-09-11 (×30): 50 mg via ORAL
  Filled 2020-09-01: qty 2
  Filled 2020-09-01: qty 1
  Filled 2020-09-01 (×4): qty 2
  Filled 2020-09-01 (×3): qty 1
  Filled 2020-09-01 (×3): qty 2
  Filled 2020-09-01 (×4): qty 1
  Filled 2020-09-01 (×2): qty 2
  Filled 2020-09-01 (×2): qty 1
  Filled 2020-09-01: qty 2
  Filled 2020-09-01 (×2): qty 1
  Filled 2020-09-01: qty 2
  Filled 2020-09-01 (×6): qty 1

## 2020-09-01 MED ORDER — SENNOSIDES-DOCUSATE SODIUM 8.6-50 MG PO TABS
1.0000 | ORAL_TABLET | Freq: Two times a day (BID) | ORAL | Status: DC
  Administered 2020-09-01 – 2020-09-08 (×8): 1 via ORAL
  Filled 2020-09-01 (×13): qty 1

## 2020-09-01 MED ORDER — CHLORHEXIDINE GLUCONATE CLOTH 2 % EX PADS
6.0000 | MEDICATED_PAD | Freq: Every day | CUTANEOUS | Status: DC
  Administered 2020-09-01 – 2020-09-12 (×11): 6 via TOPICAL

## 2020-09-01 MED ORDER — PANTOPRAZOLE SODIUM 40 MG PO TBEC
40.0000 mg | DELAYED_RELEASE_TABLET | Freq: Every day | ORAL | Status: DC
  Administered 2020-09-01 – 2020-09-08 (×7): 40 mg via ORAL
  Filled 2020-09-01 (×7): qty 1

## 2020-09-01 MED ORDER — DEXTROSE-NACL 5-0.9 % IV SOLN: INTRAVENOUS | Status: AC

## 2020-09-01 MED ORDER — METHADONE HCL 10 MG PO TABS
5.0000 mg | ORAL_TABLET | Freq: Every day | ORAL | Status: DC
  Administered 2020-09-01: 5 mg via ORAL
  Filled 2020-09-01: qty 1

## 2020-09-01 MED ORDER — DICYCLOMINE HCL 20 MG PO TABS
20.0000 mg | ORAL_TABLET | Freq: Three times a day (TID) | ORAL | Status: DC
  Administered 2020-09-01 – 2020-09-12 (×32): 20 mg via ORAL
  Filled 2020-09-01 (×34): qty 1

## 2020-09-01 MED ORDER — DIPHENOXYLATE-ATROPINE 2.5-0.025 MG PO TABS: 1.0000 | ORAL_TABLET | Freq: Four times a day (QID) | ORAL | Status: DC | PRN

## 2020-09-01 MED ORDER — CALCIUM CARBONATE ANTACID 500 MG PO CHEW
1.0000 | CHEWABLE_TABLET | Freq: Three times a day (TID) | ORAL | Status: DC | PRN
  Administered 2020-09-05 – 2020-09-08 (×2): 200 mg via ORAL
  Filled 2020-09-01 (×3): qty 1

## 2020-09-01 MED ORDER — METOPROLOL TARTRATE 50 MG PO TABS
100.0000 mg | ORAL_TABLET | Freq: Two times a day (BID) | ORAL | Status: DC
  Administered 2020-09-01 – 2020-09-11 (×20): 100 mg via ORAL
  Filled 2020-09-01 (×2): qty 2
  Filled 2020-09-01: qty 4
  Filled 2020-09-01 (×3): qty 2
  Filled 2020-09-01 (×3): qty 4
  Filled 2020-09-01 (×3): qty 2
  Filled 2020-09-01 (×2): qty 4
  Filled 2020-09-01 (×4): qty 2
  Filled 2020-09-01 (×2): qty 4

## 2020-09-01 MED ORDER — CYCLOSPORINE 0.05 % OP EMUL
1.0000 [drp] | Freq: Two times a day (BID) | OPHTHALMIC | Status: DC
  Administered 2020-09-01 – 2020-09-12 (×20): 1 [drp] via OPHTHALMIC
  Filled 2020-09-01 (×24): qty 1

## 2020-09-01 MED ORDER — HYDROMORPHONE HCL 1 MG/ML IJ SOLN
1.0000 mg | INTRAMUSCULAR | Status: DC | PRN
  Administered 2020-09-01 – 2020-09-07 (×32): 1 mg via INTRAVENOUS
  Filled 2020-09-01 (×32): qty 1

## 2020-09-01 MED ORDER — SODIUM CHLORIDE 0.9% FLUSH
10.0000 mL | Freq: Two times a day (BID) | INTRAVENOUS | Status: DC
  Administered 2020-09-01 – 2020-09-10 (×9): 10 mL

## 2020-09-01 MED ORDER — PANCRELIPASE (LIP-PROT-AMYL) 12000-38000 UNITS PO CPEP
36000.0000 [IU] | ORAL_CAPSULE | Freq: Three times a day (TID) | ORAL | Status: DC | PRN
  Administered 2020-09-01 – 2020-09-04 (×5): 36000 [IU] via ORAL
  Filled 2020-09-01 (×7): qty 1

## 2020-09-01 MED ORDER — SULFAMETHOXAZOLE-TRIMETHOPRIM 400-80 MG/5ML IV SOLN
320.0000 mg | Freq: Three times a day (TID) | INTRAVENOUS | Status: DC
  Administered 2020-09-01 – 2020-09-04 (×9): 320 mg via INTRAVENOUS
  Filled 2020-09-01 (×9): qty 20

## 2020-09-01 MED ORDER — LACTATED RINGERS IV BOLUS
1000.0000 mL | Freq: Once | INTRAVENOUS | Status: AC
  Administered 2020-09-01: 1000 mL via INTRAVENOUS

## 2020-09-01 MED ORDER — POLYVINYL ALCOHOL 1.4 % OP SOLN
1.0000 [drp] | Freq: Four times a day (QID) | OPHTHALMIC | Status: DC
  Administered 2020-09-01 – 2020-09-12 (×44): 1 [drp] via OPHTHALMIC
  Filled 2020-09-01 (×2): qty 15

## 2020-09-01 MED ORDER — BUDESONIDE 3 MG PO CPEP
3.0000 mg | ORAL_CAPSULE | Freq: Every day | ORAL | Status: DC
  Administered 2020-09-01 – 2020-09-08 (×7): 3 mg via ORAL
  Filled 2020-09-01 (×9): qty 1

## 2020-09-01 MED ORDER — SODIUM CHLORIDE 0.9% FLUSH
10.0000 mL | INTRAVENOUS | Status: DC | PRN
  Administered 2020-09-02 – 2020-09-12 (×4): 10 mL

## 2020-09-01 MED ORDER — DULOXETINE HCL 60 MG PO CPEP
90.0000 mg | ORAL_CAPSULE | Freq: Every day | ORAL | Status: DC
  Administered 2020-09-01 – 2020-09-12 (×7): 90 mg via ORAL
  Filled 2020-09-01: qty 1
  Filled 2020-09-01 (×2): qty 3
  Filled 2020-09-01 (×4): qty 1
  Filled 2020-09-01 (×3): qty 3

## 2020-09-01 MED ORDER — BUPROPION HCL ER (SR) 100 MG PO TB12
200.0000 mg | ORAL_TABLET | Freq: Every day | ORAL | Status: DC
  Administered 2020-09-01 – 2020-09-12 (×9): 200 mg via ORAL
  Filled 2020-09-01 (×12): qty 2

## 2020-09-01 MED ORDER — FAMOTIDINE 20 MG PO TABS
20.0000 mg | ORAL_TABLET | Freq: Every day | ORAL | Status: DC
  Administered 2020-09-01 – 2020-09-08 (×8): 20 mg via ORAL
  Filled 2020-09-01 (×10): qty 1

## 2020-09-01 MED ORDER — TRAZODONE HCL 100 MG PO TABS
200.0000 mg | ORAL_TABLET | Freq: Every evening | ORAL | Status: DC | PRN
  Administered 2020-09-06 – 2020-09-09 (×2): 200 mg via ORAL
  Filled 2020-09-01 (×2): qty 2

## 2020-09-01 MED ORDER — AMLODIPINE BESYLATE 10 MG PO TABS
10.0000 mg | ORAL_TABLET | Freq: Every day | ORAL | Status: DC
  Administered 2020-09-01 – 2020-09-08 (×7): 10 mg via ORAL
  Filled 2020-09-01 (×3): qty 2
  Filled 2020-09-01: qty 1
  Filled 2020-09-01: qty 2
  Filled 2020-09-01: qty 1
  Filled 2020-09-01: qty 2

## 2020-09-01 MED ORDER — HEPARIN SODIUM (PORCINE) 5000 UNIT/ML IJ SOLN
5000.0000 [IU] | Freq: Three times a day (TID) | INTRAMUSCULAR | Status: DC
  Administered 2020-09-01 (×2): 5000 [IU] via SUBCUTANEOUS
  Filled 2020-09-01 (×2): qty 1

## 2020-09-01 MED ORDER — VITAMIN B-12 1000 MCG PO TABS
1000.0000 ug | ORAL_TABLET | Freq: Every day | ORAL | Status: DC
  Administered 2020-09-02 – 2020-09-06 (×5): 1000 ug via ORAL
  Filled 2020-09-01 (×7): qty 1

## 2020-09-01 MED ORDER — SUCRALFATE 1 GM/10ML PO SUSP
1.0000 g | Freq: Three times a day (TID) | ORAL | Status: DC
  Administered 2020-09-01 – 2020-09-09 (×29): 1 g via ORAL
  Filled 2020-09-01 (×31): qty 10

## 2020-09-01 MED ORDER — PROMETHAZINE HCL 25 MG PO TABS
25.0000 mg | ORAL_TABLET | Freq: Four times a day (QID) | ORAL | Status: DC | PRN
  Administered 2020-09-01 – 2020-09-06 (×10): 25 mg via ORAL
  Filled 2020-09-01 (×15): qty 1

## 2020-09-01 MED ORDER — LOPERAMIDE HCL 2 MG PO CAPS: 2.0000 mg | ORAL_CAPSULE | ORAL | Status: DC | PRN

## 2020-09-01 MED ORDER — OMEGA-3-ACID ETHYL ESTERS 1 G PO CAPS
1000.0000 mg | ORAL_CAPSULE | Freq: Every day | ORAL | Status: DC
  Administered 2020-09-01 – 2020-09-06 (×6): 1000 mg via ORAL
  Filled 2020-09-01 (×7): qty 1

## 2020-09-01 NOTE — H&P (Signed)
History and Physical    Caitlin Peters ZOX:[id number] DOB: January 28, 1961 DOA: 08/31/2020  PCP: Caitlin Peters, Caitlin Halsted, MD  Patient coming from: Home.  Chief Complaint: Positive blood cultures.  HPI: Caitlin Peters is a 59 y.o. female with history of Crohn's disease, chronic kidney disease stage III, hypertension, chronic anemia thrombocytopenia followed by gastroenterologist at [facility] recently admitted for sepsis discharged home on August 12, 2020 about 20 days ago had come to the ER a day ago for fever at home and as per the patient patient recorded fever 102 F with some chills.  Patient has chronic abdominal pain and loose stools.  Denies any chest pain shortness of breath productive cough or vomiting.  In the ER patient had a CT abdomen pelvis which did not show anything acute LFTs are markedly elevated at that time.  Blood cultures obtained and patient was discharged home.  Blood cultures grew  Enterobacter cloacae complex,Enterobacterales,Stenotrophomonas maltophilia.  Patient was advised to come back to the ER.  Patient is on TPN and has a right femoral PICC line which could be the source.  ED Course: In the ER patient was tachycardic but not hypotensive.  Covid test was negative.  Creatinine has increased from baseline of around 1.4 it was around 1.7 patient has chronic anemia and thrombocytopenia.  LFTs is improved from yesterday.  Patient was started on Bactrim after pharmacy consult and admit for possible orthopedic sepsis with bacteremia.  The right PICC line area around the thigh does not look any gross infection externally.  Review of Systems: As per HPI, rest all negative.   Past Medical History:  Diagnosis Date  . Acute pancreatitis 04/13/2020  . Anasarca 10/2019  . AVN (avascular necrosis of bone) ([place])   . Cataract   . Chronic pain syndrome   . CKD (chronic kidney disease), stage III ([place])   . Crohn disease ([place])   . Crohn disease ([place])   . Depression   .  Diverticulosis   . GERD (gastroesophageal reflux disease)   . HTN (hypertension)   . IDA (iron deficiency anemia)   . Malnutrition ([place])   . Mass in chest   . Osteoporosis   . Pancreatitis   . Short gut syndrome   . Vitamin B12 deficiency     Past Surgical History:  Procedure Laterality Date  . ABDOMINAL HYSTERECTOMY    . ABDOMINAL SURGERY     colon resection with abdominal stoma, repair of intestinal leak, reversal of abdominal stoma  . BILIARY DILATION  11/26/2019   Procedure: BILIARY DILATION;  Surgeon: Jackquline Denmark, MD;  Location: [facility];  Service: Endoscopy;;  . BILIARY DILATION  03/08/2020   Procedure: BILIARY DILATION;  Surgeon: Irving Copas., MD;  Location: [place];  Service: Gastroenterology;;  . BIOPSY  03/08/2020   Procedure: BIOPSY;  Surgeon: Irving Copas., MD;  Location: [place];  Service: Gastroenterology;;  . CHEST WALL RESECTION     right thoracotomy,resection of chest mass with anterior rib and reconstruction using prosthetic mesh and video arthroscopy  . CHOLECYSTECTOMY    . COLONOSCOPY  2019  . ERCP N/A 11/26/2019   Procedure: ENDOSCOPIC RETROGRADE CHOLANGIOPANCREATOGRAPHY (ERCP);  Surgeon: Jackquline Denmark, MD;  Location: [facility];  Service: Endoscopy;  Laterality: N/A;  . ERCP N/A 03/08/2020   Procedure: ENDOSCOPIC RETROGRADE CHOLANGIOPANCREATOGRAPHY (ERCP);  Surgeon: Irving Copas., MD;  Location: [facility];  Service: Gastroenterology;  Laterality: N/A;  . ESOPHAGOGASTRODUODENOSCOPY N/A 03/08/2020   Procedure: ESOPHAGOGASTRODUODENOSCOPY (EGD);  Surgeon: Irving Copas., MD;  Location: [facility];  Service: Gastroenterology;  Laterality: N/A;  . EUS N/A 03/08/2020   Procedure: UPPER ENDOSCOPIC ULTRASOUND (EUS) LINEAR;  Surgeon: Irving Copas., MD;  Location: [facility] [facility];  Service: Gastroenterology;  Laterality: N/A;  . IR FLUORO GUIDE CV LINE LEFT  01/07/2020  . IR FLUORO GUIDE CV LINE LEFT   03/09/2020  . IR FLUORO GUIDE CV LINE LEFT  05/09/2020  . IR FLUORO GUIDE CV LINE LEFT  07/20/2020  . IR FLUORO GUIDE CV LINE RIGHT  08/05/2020  . IR PTA VENOUS EXCEPT DIALYSIS CIRCUIT  01/07/2020  . IR REMOVAL TUN CV CATH W/O FL  08/05/2020  . IR US GUIDE VASC ACCESS LEFT     x 2 06/17/19 and 09/14/2019  . IR US GUIDE VASC ACCESS RIGHT  08/05/2020  . KNEE SURGERY     right knee   . REMOVAL OF STONES  11/26/2019   Procedure: REMOVAL OF STONES;  Surgeon: Jackquline Denmark, MD;  [facility];  Service: Endoscopy;;  . REMOVAL OF STONES  03/08/2020   Procedure: REMOVAL OF STONES;  Surgeon: Irving Copas., MD;  Location: [place];  Service: Gastroenterology;;  . SMALL INTESTINE SURGERY     x3  . SPHINCTEROTOMY  11/26/2019   Procedure: SPHINCTEROTOMY;  Surgeon: Jackquline Denmark, MD;  Location: [facility];  Service: Endoscopy;;  . UPPER GASTROINTESTINAL ENDOSCOPY       reports that she has quit smoking. She has never used smokeless tobacco. She reports previous alcohol use. She reports that she does not use drugs.  Allergies  Allergen Reactions  . Cefepime Other (See Comments)    Neurotoxicity occurring in setting of AKI. Ceftriaxone tolerated during same admit  . Gabapentin Other (See Comments)    unknown  . Hyoscyamine Hives and Swelling    Legs swelling   Disorientation  . Lyrica [Pregabalin] Other (See Comments)    unknown  . Meperidine Hives    Other reaction(s): GI Upset Due to [place]   . Topamax [Topiramate] Other (See Comments)    unknown  . Zosyn [Piperacillin Sod-Tazobactam So]     Patient reports it makes her vomit, her neck stiff, and her "heart feel funny"  . Fentanyl Rash    Pt is allergic to fentanyl patch related to the glue (gives her a rash) Pt states she is NOT allergic to fentanyl IV medicine  . Morphine And Related Rash    Family History  Problem Relation Age of Onset  . Breast cancer Sister   . Multiple sclerosis Sister   . Diabetes  Sister   . Lupus Sister   . Colon cancer Other   . Crohn's disease Other   . Seizures Mother   . Glaucoma Mother   . CAD Father   . Heart disease Father   . Hypertension Father     Prior to Admission medications   Medication Sig Start Date End Date Taking? Authorizing Provider  acetaminophen (TYLENOL) 325 MG tablet Take 650 mg by mouth every 6 (six) hours as needed for mild pain.    Yes [provider]  amLODipine (NORVASC) 10 MG tablet Take 1 tablet (10 mg total) by mouth daily. 06/23/20 09/01/20 Yes Erline Hau, MD  budesonide (ENTOCORT EC) 3 MG 24 hr capsule TAKE 3 CAPSULES BY MOUTH ONCE DAILY Patient taking differently: Take 3 mg by mouth daily.  04/05/20  Yes Caitlin Peters, Caitlin Halsted, MD  buPROPion [facility] SR) 100 MG 12  hr tablet Take 2 tablets by mouth once daily Patient taking differently: Take 200 mg by mouth daily.  07/04/20  Yes Caitlin Peters, Caitlin Halsted, MD  calcium carbonate (TUMS - DOSED IN MG ELEMENTAL CALCIUM) 500 MG chewable tablet Chew 1 tablet by mouth 3 (three) times daily as needed for indigestion or heartburn.   Yes [provider]  cholecalciferol (VITAMIN D3) 25 MCG (1000 UT) tablet Take 1,000 Units by mouth daily.    Yes [provider]  cycloSPORINE (RESTASIS) 0.05 % ophthalmic emulsion Place 1 drop into both eyes 2 (two) times daily.   Yes [provider]  dexlansoprazole (DEXILANT) 60 MG capsule Take 1 capsule (60 mg total) by mouth daily. 06/28/20  Yes Caitlin Peters, Caitlin Halsted, MD  Dextran 70-Hypromellose 0.1-0.3 % SOLN Place 1 drop into both eyes 4 (four) times daily.   Yes [provider]  dicyclomine (BENTYL) 20 MG tablet Take 20 mg by mouth 3 (three) times daily. 10/26/19  Yes [provider]  diphenoxylate-atropine (LOMOTIL) 2.5-0.025 MG tablet Take 1 tablet by mouth 4 (four) times daily as needed for diarrhea or loose stools. 05/24/20  Yes Caitlin Peters, Caitlin Halsted, MD  DULoxetine  (CYMBALTA) 30 MG capsule Take 3 capsules (90 mg total) by mouth daily. 05/24/20  Yes Caitlin Peters, Caitlin Halsted, MD  estradiol (ESTRACE) 2 MG tablet Take 1 tablet (2 mg total) by mouth daily. 06/28/20  Yes Caitlin Peters, Caitlin Halsted, MD  famotidine (PEPCID) 20 MG tablet Take 20 mg by mouth at bedtime.   Yes [provider]  hydrALAZINE (APRESOLINE) 50 MG tablet Take 1 tablet (50 mg total) by mouth 3 (three) times daily. 06/28/20  Yes Caitlin Peters, Caitlin Halsted, MD  lipase/protease/amylase (CREON) 36000 UNITS CPEP capsule Take 1 capsule (36,000 Units total) by mouth 3 (three) times daily as needed (with meals for digestion). 06/28/20  Yes Caitlin Peters, Caitlin Halsted, MD  loperamide (IMODIUM) 2 MG capsule TAKE 1 CAPSULE BY MOUTH AS NEEDED FOR DIARRHEA OR  LOOSE  STOOLS Patient taking differently: Take 2 mg by mouth as needed for diarrhea or loose stools.  07/29/20  Yes Erline Hau, MD  methadone (DOLOPHINE) 5 MG tablet Take 1 tablet (5 mg total) by mouth 5 (five) times daily. 06/30/20  Yes Florencia Reasons, MD  metoprolol tartrate (LOPRESSOR) 100 MG tablet Take 1 tablet (100 mg total) by mouth 2 (two) times daily. 06/28/20  Yes Caitlin Peters, Caitlin Halsted, MD  Multiple Vitamins-Minerals (MULTIVITAMIN ADULT PO) Take 1 tablet by mouth daily.   Yes [provider]  Omega-3 1000 MG CAPS Take 1,000 mg by mouth daily.    Yes [provider]  PRESCRIPTION MEDICATION Inject 1 each into the vein daily. Home TPN . [facility] . 1 bag for 16 hours. [phone]   Yes [provider]  Probiotic Product (PROBIOTIC-10 PO) Take 1 capsule by mouth daily.    Yes [provider]  promethazine (PHENERGAN) 25 MG tablet TAKE ONE TABLET BY MOUTH EVERY 6 HOURS AS NEEDED FOR NAUSEA AND VOMITING Patient taking differently: Take 25 mg by mouth every 6 (six) hours as needed for nausea or vomiting.  08/26/20  Yes Caitlin Peters, Caitlin Halsted, MD  senna-docusate  (SENOKOT-S) 8.6-50 MG tablet Take 1 tablet by mouth 2 (two) times daily. 08/12/20  Yes Eugenie Filler, MD  sucralfate (CARAFATE) 1 GM/10ML suspension Take 10 mLs (1 g total) by mouth 4 (four) times daily -  with meals and at bedtime. 06/28/20  Yes Caitlin Peters, Caitlin Halsted, MD  traZODone (DESYREL) 100 MG tablet Take 2 tablets (200 mg total) by mouth at bedtime as needed for sleep. 06/28/20  Yes Caitlin Peters, Caitlin Halsted, MD  vitamin B-12 (CYANOCOBALAMIN) 100 MCG tablet Take 1,000 mcg by mouth daily.    Yes [provider]    Physical Exam: Constitutional: Moderately built and nourished. Vitals:   08/31/20 2249 09/01/20 0109 09/01/20 0214 09/01/20 0300  BP: 136/66 136/68 123/62 126/63  Pulse: (!) 112 (!) 117 67 (!) 116  Resp: 17 15 17 20   Temp: 97.9 F (36.6 C)     TempSrc: Oral     SpO2: 100% 100% 100% 100%   Eyes: Anicteric no pallor. ENMT: No discharge from the ears eyes nose or mouth. Neck: No mass felt.  No neck rigidity. Respiratory: No rhonchi or crepitations. Cardiovascular: S1-S2 heard. Abdomen: Soft mild tenderness mostly in the left lower quadrant. Musculoskeletal: No edema. Skin: No rash. Neurologic: Alert awake oriented to time place and person.  Moves all extremities. Psychiatric: Appears normal.  Normal affect.   Labs on Admission: I have personally reviewed following labs and imaging studies  CBC: Recent Labs  Lab 08/30/20 2249 08/31/20 2335  WBC 1.7* 6.3  NEUTROABS 1.5* 4.8  HGB 10.2* 8.0*  HCT 30.7* 24.3*  MCV 91.4 93.5  PLT 125* 323*   Basic Metabolic Panel: Recent Labs  Lab 08/30/20 2249 08/31/20 2335  NA 139 138  K 3.7 3.8  CL 102 105  CO2 26 25  GLUCOSE 83 87  BUN 33* 35*  CREATININE 1.42* 1.76*  CALCIUM 9.2 9.3   GFR: Estimated Creatinine Clearance: 34.5 mL/min (A) (by C-G formula based on SCr of 1.76 mg/dL (H)). Liver Function Tests: Recent Labs  Lab 08/30/20 2249 08/31/20 2335  AST 973* 326*  ALT 573* 544*  ALKPHOS  219* 232*  BILITOT 0.9 1.0  PROT 6.7 6.9  ALBUMIN 2.9* 3.0*   No results for input(s): LIPASE, AMYLASE in the last 168 hours. No results for input(s): AMMONIA in the last 168 hours. Coagulation Profile: Recent Labs  Lab 08/31/20 2335  INR 1.0   Cardiac Enzymes: No results for input(s): CKTOTAL, CKMB, CKMBINDEX, TROPONINI in the last 168 hours. BNP (last 3 results) No results for input(s): PROBNP in the last 8760 hours. HbA1C: No results for input(s): HGBA1C in the last 72 hours. CBG: No results for input(s): GLUCAP in the last 168 hours. Lipid Profile: No results for input(s): CHOL, HDL, LDLCALC, TRIG, CHOLHDL, LDLDIRECT in the last 72 hours. Thyroid Function Tests: No results for input(s): TSH, T4TOTAL, FREET4, T3FREE, THYROIDAB in the last 72 hours. Anemia Panel: No results for input(s): VITAMINB12, FOLATE, FERRITIN, TIBC, IRON, RETICCTPCT in the last 72 hours. Urine analysis:    Component Value Date/Time   COLORURINE YELLOW 08/31/2020 0330   APPEARANCEUR CLEAR 08/31/2020 0330   LABSPEC 1.041 (H) 08/31/2020 0330   PHURINE 6.0 08/31/2020 0330   GLUCOSEU NEGATIVE 08/31/2020 0330   HGBUR NEGATIVE 08/31/2020 0330   BILIRUBINUR NEGATIVE 08/31/2020 0330   KETONESUR NEGATIVE 08/31/2020 0330   PROTEINUR 30 (A) 08/31/2020 0330   NITRITE NEGATIVE 08/31/2020 0330   LEUKOCYTESUR NEGATIVE 08/31/2020 0330   Sepsis Labs: @LABRCNTIP (procalcitonin:4,lacticidven:4) ) Recent Results (from the past 240 hour(s))  Blood culture (routine x 2)     Status: None (Preliminary result)   Collection Time: 08/30/20 10:50 PM   Specimen: BLOOD  Result Value Ref Range Status   Specimen  Description   Final    BLOOD CENTRAL LINE Performed at [facility], [place] [address] [place], [place] [address]    Special Requests   Final    BOTTLES DRAWN AEROBIC AND ANAEROBIC Blood Culture adequate volume Performed at [facility] [place] [address], [place], [place]  [address]    Culture  Setup Time   Final    GRAM NEGATIVE RODS AEROBIC BOTTLE ONLY Organism ID to follow CRITICAL RESULT CALLED TO, READ BACK BY AND VERIFIED WITH: L ADKINS RN 08/31/20 2114 JDW Performed at [facility], [address], [place], [place] [address]    Culture PENDING  Incomplete   Report Status PENDING  Incomplete  Blood Culture ID Panel (Reflexed)     Status: Abnormal   Collection Time: 08/30/20 10:50 PM  Result Value Ref Range Status   Enterococcus faecalis NOT DETECTED NOT DETECTED Final   Enterococcus Faecium NOT DETECTED NOT DETECTED Final   Listeria monocytogenes NOT DETECTED NOT DETECTED Final   Staphylococcus species NOT DETECTED NOT DETECTED Final   Staphylococcus aureus (BCID) NOT DETECTED NOT DETECTED Final   Staphylococcus epidermidis NOT DETECTED NOT DETECTED Final   Staphylococcus lugdunensis NOT DETECTED NOT DETECTED Final   Streptococcus species NOT DETECTED NOT DETECTED Final   Streptococcus agalactiae NOT DETECTED NOT DETECTED Final   Streptococcus pneumoniae NOT DETECTED NOT DETECTED Final   Streptococcus pyogenes NOT DETECTED NOT DETECTED Final   A.calcoaceticus-baumannii NOT DETECTED NOT DETECTED Final   Bacteroides fragilis NOT DETECTED NOT DETECTED Final   Enterobacterales NOT DETECTED NOT DETECTED Final   Enterobacter cloacae complex NOT DETECTED NOT DETECTED Final   Escherichia coli NOT DETECTED NOT DETECTED Final   Klebsiella aerogenes NOT DETECTED NOT DETECTED Final   Klebsiella oxytoca NOT DETECTED NOT DETECTED Final   Klebsiella pneumoniae NOT DETECTED NOT DETECTED Final   Proteus species NOT DETECTED NOT DETECTED Final   Salmonella species NOT DETECTED NOT DETECTED Final   Serratia marcescens NOT DETECTED NOT DETECTED Final   Haemophilus influenzae NOT DETECTED NOT DETECTED Final   Neisseria meningitidis NOT DETECTED NOT DETECTED Final   Pseudomonas aeruginosa NOT DETECTED NOT DETECTED Final   Stenotrophomonas maltophilia DETECTED  (A) NOT DETECTED Final    Comment: CRITICAL RESULT CALLED TO, READ BACK BY AND VERIFIED WITH: L ADKINS RN 08/31/20 2114 JDW    Candida albicans NOT DETECTED NOT DETECTED Final   Candida auris NOT DETECTED NOT DETECTED Final   Candida glabrata NOT DETECTED NOT DETECTED Final   Candida krusei NOT DETECTED NOT DETECTED Final   Candida parapsilosis NOT DETECTED NOT DETECTED Final   Candida tropicalis NOT DETECTED NOT DETECTED Final   Cryptococcus neoformans/gattii NOT DETECTED NOT DETECTED Final    Comment: Performed at [facility], [address] [place], [place] [address]  Respiratory Panel by RT PCR (Flu A&B, Covid) - Nasopharyngeal Swab     Status: None   Collection Time: 08/31/20 12:03 AM   Specimen: Nasopharyngeal Swab  Result Value Ref Range Status   SARS Coronavirus 2 by RT PCR NEGATIVE NEGATIVE Final    Comment: (NOTE) SARS-CoV-2 target nucleic acids are NOT DETECTED.  The SARS-CoV-2 RNA is generally detectable in upper respiratoy specimens during the acute phase of infection. The lowest concentration of SARS-CoV-2 viral copies this assay can detect is 131 copies/mL. A negative result does not preclude SARS-Cov-2 infection and should not be used as the sole basis for treatment or other patient management decisions. A negative  result may occur with  improper specimen collection/handling, submission of specimen other than nasopharyngeal swab, presence of viral mutation(s) within the areas targeted by this assay, and inadequate number of viral copies (<131 copies/mL). A negative result must be combined with clinical observations, patient history, and epidemiological information. The expected result is Negative.  Fact Sheet for Patients:  PinkCheek.be  Fact Sheet for Healthcare Providers:  GravelBags.it  This test is no t yet approved or cleared by the [place] FDA and  has been authorized for detection  and/or diagnosis of SARS-CoV-2 by FDA under an Emergency Use Authorization (EUA). This EUA will remain  in effect (meaning this test can be used) for the duration of the COVID-19 declaration under Section 564(b)(1) of the Act, 21 U.S.C. section 360bbb-3(b)(1), unless the authorization is terminated or revoked sooner.     Influenza A by PCR NEGATIVE NEGATIVE Final   Influenza B by PCR NEGATIVE NEGATIVE Final    Comment: (NOTE) The Xpert Xpress SARS-CoV-2/FLU/RSV assay is intended as an aid in  the diagnosis of influenza from Nasopharyngeal swab specimens and  should not be used as a sole basis for treatment. Nasal washings and  aspirates are unacceptable for Xpert Xpress SARS-CoV-2/FLU/RSV  testing.  Fact Sheet for Patients: PinkCheek.be  Fact Sheet for Healthcare Providers: GravelBags.it  This test is not yet approved or cleared by the [place] FDA and  has been authorized for detection and/or diagnosis of SARS-CoV-2 by  FDA under an Emergency Use Authorization (EUA). This EUA will remain  in effect (meaning this test can be used) for the duration of the  Covid-19 declaration under Section 564(b)(1) of the Act, 21  U.S.C. section 360bbb-3(b)(1), unless the authorization is  terminated or revoked. Performed at [facility], [place] [address], [place], [place] [address]   Urine culture     Status: None (Preliminary result)   Collection Time: 08/31/20  3:30 AM   Specimen: In/Out Cath Urine  Result Value Ref Range Status   Specimen Description   Final    IN/OUT CATH URINE Performed at [place] [address] [place], [place] [address]    Special Requests   Final    NONE Performed at [facility], [place] [address], [place], [place] [address]    Culture   Final    TOO YOUNG TO READ Performed at [facility], [place] [address], [place], [place] [address]     Report Status PENDING  Incomplete     Radiological Exams on Admission: CT ABDOMEN PELVIS W CONTRAST  Result Date: 08/31/2020 CLINICAL DATA:  Abdominal pain fever EXAM: CT ABDOMEN AND PELVIS WITH CONTRAST TECHNIQUE: Multidetector CT imaging of the abdomen and pelvis was performed using the standard protocol following bolus administration of intravenous contrast. CONTRAST:  125m OMNIPAQUE IOHEXOL 300 MG/ML  SOLN COMPARISON:  August 09, 2020 FINDINGS: Lower chest: The visualized heart size within normal limits. No pericardial fluid/thickening. No hiatal hernia. The visualized portions of the lungs are clear. Hepatobiliary: The liver is normal in density without focal abnormality.The main portal vein is patent. Again noted is pneumobilia likely from prior postsurgical change. Mild common bile duct and intrahepatic biliary ductal dilatation is again noted. Pancreas: There is unchanged pancreatic ductal dilatation. No pancreatic ductal dilatation or surrounding inflammatory changes. Spleen: Normal in size without focal abnormality. Adrenals/Urinary Tract: Both adrenal glands appear normal. The kidneys and collecting system appear normal without evidence of urinary tract calculus or hydronephrosis. Bladder  is unremarkable. Stomach/Bowel: The stomach and small bowel are grossly unremarkable. Surgical colonic anastomosis is again identified within the lower abdomen. There is mildly prominent fluid and air-filled loops of colon within the left lower quadrant within the sigmoid colon. There is however apparent wall thickening of the distal sigmoid colon and rectum without surrounding fat stranding changes. Within the mid descending colon again identified is a possible area of focal narrowing which is nonspecific, series 4, image 70. Vascular/Lymphatic: There are no enlarged mesenteric, retroperitoneal, or pelvic lymph nodes. Again noted is a right femoral venous catheter in unchanged position. Reproductive:  Scattered aortic atherosclerotic calcifications are seen without aneurysmal dilatation. Other: No evidence of abdominal wall mass or hernia. Musculoskeletal: No acute or significant osseous findings. IMPRESSION: Unchanged pneumobilia and mild common bile duct and pancreatic ductal dilatation. Postsurgical changes within the colon with a mildly prominent fluid and air-filled sigmoid colon. There is apparent wall thickening of the distal sigmoid colon and rectum which may be due to underdistention versus mild colitis. As on prior exam again noted is a focal area of narrowing within the distal descending colon which could be due to colitis versus underlying mass lesion. On resolution of symptoms would recommend direct visualization. Aortic Atherosclerosis (ICD10-I70.0). Electronically Signed   By: Prudencio Pair M.D.   On: 08/31/2020 01:45   DG Chest Portable 1 View  Result Date: 08/30/2020 CLINICAL DATA:  Shortness of breath EXAM: PORTABLE CHEST 1 VIEW COMPARISON:  08/02/2020 FINDINGS: Cardiac shadow is stable. Postsurgical changes are noted on right. No focal infiltrate or sizable effusion is seen. No bony abnormality is noted. IMPRESSION: No acute abnormality noted. Electronically Signed   By: Inez Catalina M.D.   On: 08/30/2020 23:19    EKG: Independently reviewed.  Normal sinus rhythm with atrial premature complexes.  Assessment/Plan Principal Problem:   Sepsis ([place]) Active Problems:   Crohn's colitis ([place])   Crohn disease ([place])   On total parenteral nutrition (TPN)   Abnormal LFTs   Stage 3b chronic kidney disease ([name])   Bacteremia    1. Bacteremia with possible developing sepsis source could be possibly from the PICC line on the right femoral area.  Will get infectious disease consult and possibly the PICC line may need to come off.  PICC line is used for the TPN.  Presently on Bactrim as dosed per pharmacy.  Continue hydration.  CT scan done a day ago did show some possibility of colitis  involving the sigmoid area. 2. Abnormal LFTs are improving at this time compared to the one done 24 hours ago. 3. Acute on chronic kidney disease stage III I think will improve with fluids.  Follow metabolic panel. 4. Chronic anemia and thrombocytopenia follow CBC. 5. Hypertension on amlodipine beta-blockers and hydralazine. 6. 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  Being followed at [facility]. 7. Chronic pain on methadone presently on Dilaudid.  Since patient has bacteremia with possible developing sepsis will need close monitoring for any further worsening in inpatient status.   DVT prophylaxis: Heparin. Code Status: Full code. Family Communication: Discussed with patient. Disposition Plan: Home. Consults called: None. Admission status: Inpatient.   Rise Patience MD [facility] Pager [phone].  If 7PM-7AM, please contact night-coverage [email or web address] Password [email or web address]  09/01/2020, 4:01 AM

## 2020-09-01 NOTE — Plan of Care (Signed)

## 2020-09-01 NOTE — Progress Notes (Signed)
PROGRESS NOTE    TANE BIEGLER  RSW:[id number] DOB: 02-13-1961 DOA: 08/31/2020 PCP: Erline Hau, MD    Brief Narrative:  59 y.o. female with history of Crohn's disease, chronic kidney disease stage III, hypertension, chronic anemia thrombocytopenia followed by gastroenterologist at [facility] recently admitted for sepsis discharged home on August 12, 2020 about 20 days ago had come to the ER a day ago for fever at home and as per the patient patient recorded fever 102 F with some chills.  Patient has chronic abdominal pain and loose stools.  Denies any chest pain shortness of breath productive cough or vomiting.  In the ER patient had a CT abdomen pelvis which did not show anything acute LFTs are markedly elevated at that time.  Blood cultures obtained and patient was discharged home.  Blood cultures grew  Enterobacter cloacae complex,Enterobacterales,Stenotrophomonas maltophilia.  Patient was advised to come back to the ER.  Patient is on TPN and has a right femoral PICC line which could be the source.  Assessment & Plan:   Principal Problem:   Sepsis ([place]) Active Problems:   Crohn's colitis ([place])   Crohn disease ([place])   On total parenteral nutrition (TPN)   Abnormal LFTs   Stage 3b chronic kidney disease ([place])   Bacteremia  1. Bacteremia with sepsis present on admit 1. Presenting fever, tachycardia, leukopenia 2. Blood cx on 10/5 thus far pos for Stenotrophomonas maltophilia 3. Chart reviewed. 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 Pt with R femoral picc placed last month. Pt know to have very difficult access 5. Pt started on empiric IV bactrim 6. Have consulted ID who will see in consultation. ID had recommended removing PICC 7. Consulted IR for central access removal 8. Will repeat CBC in AM 2. Abnormal LFTs are improving 1. Tolerating diet, denies RUQ pain 2. Will repeat LFT's in AM 3. Acute on chronic kidney disease  stage III 1. Cr peak at 1.76. Pt voiding well 2. On IVF hydration 3. Repeat bmet in AM 4. Chronic anemia and thrombocytopenia 1. Plts and hgb slowly trending down 2. Suspect secondary to presenting sepsis. 3. Plts now <100k. Will hold heparin and start SCD 4. Repeat CBC in AM 5. Hypertension 1.  continue amlodipine beta-blockers and hydralazine 2. Stable and controlled at present 6. 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   1. Being followed at [facility]. 2. TPN on hold with rec by ID to remove PICC 3. Tolerating diet 7. Chronic pain 1.  on methadone and presently on Dilaudid.  DVT prophylaxis: SCD's Code Status: Full Family Communication: Pt in room, family not at bedside  Status is: Inpatient  Remains inpatient appropriate because:Unsafe d/c plan, IV treatments appropriate due to intensity of illness or inability to take PO and Inpatient level of care appropriate due to severity of illness   Dispo: The patient is from: Home              Anticipated d/c is to: Home              Anticipated d/c date is: > 3 days              Patient currently is not medically stable to d/c.       Consultants:   ID  IR  Procedures:     Antimicrobials: Anti-infectives (From admission, onward)   Start     Dose/Rate Route Frequency Ordered Stop   09/01/20  1600  sulfamethoxazole-trimethoprim (BACTRIM) 320 mg in dextrose 5 % 500 mL IVPB        320 mg 346.7 mL/hr over 90 Minutes Intravenous Every 8 hours 09/01/20 0819     09/01/20 0000  sulfamethoxazole-trimethoprim (BACTRIM) 317.44 mg in dextrose 5 % 500 mL IVPB  Status:  Discontinued        15 mg/kg/day  63.5 kg 346.6 mL/hr over 90 Minutes Intravenous Every 8 hours 08/31/20 2356 09/01/20 0819       Subjective: Complaining RLQ pain  Objective: Vitals:   09/01/20 0214 09/01/20 0300 09/01/20 0510 09/01/20 0924  BP: 123/62 126/63 (!) 144/66 132/61  Pulse: 67 (!) 116 (!) 59 64  Resp: 17 20 16 18   Temp:   99 F  (37.2 C) 98.9 F (37.2 C)  TempSrc:   Oral   SpO2: 100% 100% 100% 99%  Weight:   63.5 kg   Height:   5' 8"  (1.727 m)     Intake/Output Summary (Last 24 hours) at 09/01/2020 1759 Last data filed at 09/01/2020 1400 Gross per 24 hour  Intake 929.54 ml  Output 1200 ml  Net -270.46 ml   Filed Weights   09/01/20 0510  Weight: 63.5 kg    Examination:  General exam: Appears calm and comfortable  Respiratory system: Clear to auscultation. Respiratory effort normal. Cardiovascular system: S1 & S2 heard, Regular Gastrointestinal system: tenderness over LLQ, pos BS Central nervous system: Alert and oriented. No focal neurological deficits. Extremities: Symmetric 5 x 5 power. Skin: No rashes, lesions  Psychiatry: Judgement and insight appear normal. Mood & affect appropriate.   Data Reviewed: I have personally reviewed following labs and imaging studies  CBC: Recent Labs  Lab 08/30/20 2249 08/31/20 2335 09/01/20 0558  WBC 1.7* 6.3 4.7  NEUTROABS 1.5* 4.8  --   HGB 10.2* 8.0* 7.8*  HCT 30.7* 24.3* 25.0*  MCV 91.4 93.5 96.9  PLT 125* 119* 94*   Basic Metabolic Panel: Recent Labs  Lab 08/30/20 2249 08/31/20 2335 09/01/20 0558  NA 139 138 136  K 3.7 3.8 3.6  CL 102 105 103  CO2 26 25 22   GLUCOSE 83 87 97  BUN 33* 35* 27*  CREATININE 1.42* 1.76* 1.58*  CALCIUM 9.2 9.3 9.3  MG  --   --  1.7  PHOS  --   --  2.9   GFR: Estimated Creatinine Clearance: 38.4 mL/min (A) (by C-G formula based on SCr of 1.58 mg/dL (H)). Liver Function Tests: Recent Labs  Lab 08/30/20 2249 08/31/20 2335 09/01/20 0558  AST 973* 326* 259*  ALT 573* 544* 458*  ALKPHOS 219* 232* 211*  BILITOT 0.9 1.0 1.0  PROT 6.7 6.9 6.5  ALBUMIN 2.9* 3.0* 2.8*   No results for input(s): LIPASE, AMYLASE in the last 168 hours. No results for input(s): AMMONIA in the last 168 hours. Coagulation Profile: Recent Labs  Lab 08/31/20 2335  INR 1.0   Cardiac Enzymes: No results for input(s): CKTOTAL,  CKMB, CKMBINDEX, TROPONINI in the last 168 hours. BNP (last 3 results) No results for input(s): PROBNP in the last 8760 hours. HbA1C: No results for input(s): HGBA1C in the last 72 hours. CBG: Recent Labs  Lab 09/01/20 0557 09/01/20 1133 09/01/20 1209 09/01/20 1753  GLUCAP 89 65* 72 94   Lipid Profile: No results for input(s): CHOL, HDL, LDLCALC, TRIG, CHOLHDL, LDLDIRECT in the last 72 hours. Thyroid Function Tests: No results for input(s): TSH, T4TOTAL, FREET4, T3FREE, THYROIDAB in the last 72 hours. Anemia  Panel: No results for input(s): VITAMINB12, FOLATE, FERRITIN, TIBC, IRON, RETICCTPCT in the last 72 hours. Sepsis Labs: Recent Labs  Lab 08/30/20 2315 08/31/20 2335  LATICACIDVEN 1.2 0.5    Recent Results (from the past 240 hour(s))  Blood culture (routine x 2)     Status: Abnormal (Preliminary result)   Collection Time: 08/30/20 10:50 PM   Specimen: BLOOD  Result Value Ref Range Status   Specimen Description   Final    BLOOD CENTRAL LINE Performed at [facility], [place] [address] [place], [place] [address]    Special Requests   Final    BOTTLES DRAWN AEROBIC AND ANAEROBIC Blood Culture adequate volume Performed at [facility] [address] [place], [place] [address]    Culture  Setup Time   Final    GRAM NEGATIVE RODS AEROBIC BOTTLE ONLY Organism ID to follow CRITICAL RESULT CALLED TO, READ BACK BY AND VERIFIED WITH: L ADKINS RN 08/31/20 2114 JDW Performed at [facility], [address] [place], [place] [address]    Culture STENOTROPHOMONAS MALTOPHILIA (A)  Final   Report Status PENDING  Incomplete  Blood Culture ID Panel (Reflexed)     Status: Abnormal   Collection Time: 08/30/20 10:50 PM  Result Value Ref Range Status   Enterococcus faecalis NOT DETECTED NOT DETECTED Final   Enterococcus Faecium NOT DETECTED NOT DETECTED Final   Listeria monocytogenes NOT DETECTED NOT DETECTED Final   Staphylococcus  species NOT DETECTED NOT DETECTED Final   Staphylococcus aureus (BCID) NOT DETECTED NOT DETECTED Final   Staphylococcus epidermidis NOT DETECTED NOT DETECTED Final   Staphylococcus lugdunensis NOT DETECTED NOT DETECTED Final   Streptococcus species NOT DETECTED NOT DETECTED Final   Streptococcus agalactiae NOT DETECTED NOT DETECTED Final   Streptococcus pneumoniae NOT DETECTED NOT DETECTED Final   Streptococcus pyogenes NOT DETECTED NOT DETECTED Final   A.calcoaceticus-baumannii NOT DETECTED NOT DETECTED Final   Bacteroides fragilis NOT DETECTED NOT DETECTED Final   Enterobacterales NOT DETECTED NOT DETECTED Final   Enterobacter cloacae complex NOT DETECTED NOT DETECTED Final   Escherichia coli NOT DETECTED NOT DETECTED Final   Klebsiella aerogenes NOT DETECTED NOT DETECTED Final   Klebsiella oxytoca NOT DETECTED NOT DETECTED Final   Klebsiella pneumoniae NOT DETECTED NOT DETECTED Final   Proteus species NOT DETECTED NOT DETECTED Final   Salmonella species NOT DETECTED NOT DETECTED Final   Serratia marcescens NOT DETECTED NOT DETECTED Final   Haemophilus influenzae NOT DETECTED NOT DETECTED Final   Neisseria meningitidis NOT DETECTED NOT DETECTED Final   Pseudomonas aeruginosa NOT DETECTED NOT DETECTED Final   Stenotrophomonas maltophilia DETECTED (A) NOT DETECTED Final    Comment: CRITICAL RESULT CALLED TO, READ BACK BY AND VERIFIED WITH: L ADKINS RN 08/31/20 2114 JDW    Candida albicans NOT DETECTED NOT DETECTED Final   Candida auris NOT DETECTED NOT DETECTED Final   Candida glabrata NOT DETECTED NOT DETECTED Final   Candida krusei NOT DETECTED NOT DETECTED Final   Candida parapsilosis NOT DETECTED NOT DETECTED Final   Candida tropicalis NOT DETECTED NOT DETECTED Final   Cryptococcus neoformans/gattii NOT DETECTED NOT DETECTED Final    Comment: Performed at [facility], [address], [place],  [address]  Respiratory Panel by RT PCR (Flu A&B, Covid) - Nasopharyngeal  Swab     Status: None   Collection Time: 08/31/20 12:03 AM   Specimen: Nasopharyngeal Swab  Result Value Ref Range Status   SARS Coronavirus  2 by RT PCR NEGATIVE NEGATIVE Final    Comment: (NOTE) SARS-CoV-2 target nucleic acids are NOT DETECTED.  The SARS-CoV-2 RNA is generally detectable in upper respiratoy specimens during the acute phase of infection. The lowest concentration of SARS-CoV-2 viral copies this assay can detect is 131 copies/mL. A negative result does not preclude SARS-Cov-2 infection and should not be used as the sole basis for treatment or other patient management decisions. A negative result may occur with  improper specimen collection/handling, submission of specimen other than nasopharyngeal swab, presence of viral mutation(s) within the areas targeted by this assay, and inadequate number of viral copies (<131 copies/mL). A negative result must be combined with clinical observations, patient history, and epidemiological information. The expected result is Negative.  Fact Sheet for Patients:  PinkCheek.be  Fact Sheet for Healthcare Providers:  GravelBags.it  This test is no t yet approved or cleared by the [place] FDA and  has been authorized for detection and/or diagnosis of SARS-CoV-2 by FDA under an Emergency Use Authorization (EUA). This EUA will remain  in effect (meaning this test can be used) for the duration of the COVID-19 declaration under Section 564(b)(1) of the Act, 21 U.S.C. section 360bbb-3(b)(1), unless the authorization is terminated or revoked sooner.     Influenza A by PCR NEGATIVE NEGATIVE Final   Influenza B by PCR NEGATIVE NEGATIVE Final    Comment: (NOTE) The Xpert Xpress SARS-CoV-2/FLU/RSV assay is intended as an aid in  the diagnosis of influenza from Nasopharyngeal swab specimens and  should not be used as a sole basis for treatment. Nasal washings and  aspirates are  unacceptable for Xpert Xpress SARS-CoV-2/FLU/RSV  testing.  Fact Sheet for Patients: PinkCheek.be  Fact Sheet for Healthcare Providers: GravelBags.it  This test is not yet approved or cleared by the [place] FDA and  has been authorized for detection and/or diagnosis of SARS-CoV-2 by  FDA under an Emergency Use Authorization (EUA). This EUA will remain  in effect (meaning this test can be used) for the duration of the  Covid-19 declaration under Section 564(b)(1) of the Act, 21  U.S.C. section 360bbb-3(b)(1), unless the authorization is  terminated or revoked. Performed at [facility], [place] [address] [place], [place] [address]   Urine culture     Status: Abnormal (Preliminary result)   Collection Time: 08/31/20  3:30 AM   Specimen: In/Out Cath Urine  Result Value Ref Range Status   Specimen Description   Final    IN/OUT CATH URINE Performed at [facility] [address], [place], [place] [address]    Special Requests   Final    NONE Performed at [facility], [place] [address] [place], [place] [address]    Culture (A)  Final    100 COLONIES/mL STAPHYLOCOCCUS EPIDERMIDIS 100 COLONIES/mL STAPHYLOCOCCUS WARNERI SUSCEPTIBILITIES TO FOLLOW Performed at [facility], [place] [address], [place], [place] [address]    Report Status PENDING  Incomplete  Blood Culture (routine x 2)     Status: None (Preliminary result)   Collection Time: 08/31/20 11:35 PM   Specimen: BLOOD  Result Value Ref Range Status   Specimen Description   Final    BLOOD PICC LINE Performed at [place] [address] [place],  [address]    Special Requests   Final    BOTTLES DRAWN AEROBIC AND ANAEROBIC Blood Culture adequate volume Performed at [place] [address] [place],  [place] [address]    Culture  Setup Time   Final     GRAM NEGATIVE RODS AEROBIC BOTTLE ONLY CRITICAL VALUE NOTED.  VALUE IS CONSISTENT WITH PREVIOUSLY REPORTED AND CALLED VALUE. Performed at [facility], [place] [address], [place], [place] [address]    Culture GRAM NEGATIVE RODS  Final   Report Status PENDING  Incomplete  Respiratory Panel by RT PCR (Flu A&B, Covid) - Nasopharyngeal Swab     Status: None   Collection Time: 09/01/20  3:34 AM   Specimen: Nasopharyngeal Swab  Result Value Ref Range Status   SARS Coronavirus 2 by RT PCR NEGATIVE NEGATIVE Final    Comment: (NOTE) SARS-CoV-2 target nucleic acids are NOT DETECTED.  The SARS-CoV-2 RNA is generally detectable in upper respiratoy specimens during the acute phase of infection. The lowest concentration of SARS-CoV-2 viral copies this assay can detect is 131 copies/mL. A negative result does not preclude SARS-Cov-2 infection and should not be used as the sole basis for treatment or other patient management decisions. A negative result may occur with  improper specimen collection/handling, submission of specimen other than nasopharyngeal swab, presence of viral mutation(s) within the areas targeted by this assay, and inadequate number of viral copies (<131 copies/mL). A negative result must be combined with clinical observations, patient history, and epidemiological information. The expected result is Negative.  Fact Sheet for Patients:  PinkCheek.be  Fact Sheet for Healthcare Providers:  GravelBags.it  This test is no t yet approved or cleared by the [place] FDA and  has been authorized for detection and/or diagnosis of SARS-CoV-2 by FDA under an Emergency Use Authorization (EUA). This EUA will remain  in effect (meaning this test can be used) for the duration of the COVID-19 declaration under Section 564(b)(1) of the Act, 21 U.S.C. section 360bbb-3(b)(1), unless the authorization is terminated or revoked  sooner.     Influenza A by PCR NEGATIVE NEGATIVE Final   Influenza B by PCR NEGATIVE NEGATIVE Final    Comment: (NOTE) The Xpert Xpress SARS-CoV-2/FLU/RSV assay is intended as an aid in  the diagnosis of influenza from Nasopharyngeal swab specimens and  should not be used as a sole basis for treatment. Nasal washings and  aspirates are unacceptable for Xpert Xpress SARS-CoV-2/FLU/RSV  testing.  Fact Sheet for Patients: PinkCheek.be  Fact Sheet for Healthcare Providers: GravelBags.it  This test is not yet approved or cleared by the [place] FDA and  has been authorized for detection and/or diagnosis of SARS-CoV-2 by  FDA under an Emergency Use Authorization (EUA). This EUA will remain  in effect (meaning this test can be used) for the duration of the  Covid-19 declaration under Section 564(b)(1) of the Act, 21  U.S.C. section 360bbb-3(b)(1), unless the authorization is  terminated or revoked. Performed at [facility], [place] [address] [place], [place] [address]   Blood Culture (routine x 2)     Status: None (Preliminary result)   Collection Time: 09/01/20  5:58 AM   Specimen: BLOOD LEFT HAND  Result Value Ref Range Status   Specimen Description   Final    BLOOD LEFT HAND Performed at [facility] [address] [place], [place] [address]    Special Requests   Final    BOTTLES DRAWN AEROBIC ONLY Blood Culture adequate volume Performed at [place] [address], [place], [place] [address]    Culture   Final    NO GROWTH < 12 HOURS  Performed at [facility], [place] [address] [place], [place] [address]    Report Status PENDING  Incomplete     Radiology Studies: CT ABDOMEN PELVIS W CONTRAST  Result Date: 08/31/2020 CLINICAL DATA:  Abdominal pain fever EXAM: CT ABDOMEN AND PELVIS WITH CONTRAST TECHNIQUE: Multidetector CT imaging of the abdomen  and pelvis was performed using the standard protocol following bolus administration of intravenous contrast. CONTRAST:  168m OMNIPAQUE IOHEXOL 300 MG/ML  SOLN COMPARISON:  August 09, 2020 FINDINGS: Lower chest: The visualized heart size within normal limits. No pericardial fluid/thickening. No hiatal hernia. The visualized portions of the lungs are clear. Hepatobiliary: The liver is normal in density without focal abnormality.The main portal vein is patent. Again noted is pneumobilia likely from prior postsurgical change. Mild common bile duct and intrahepatic biliary ductal dilatation is again noted. Pancreas: There is unchanged pancreatic ductal dilatation. No pancreatic ductal dilatation or surrounding inflammatory changes. Spleen: Normal in size without focal abnormality. Adrenals/Urinary Tract: Both adrenal glands appear normal. The kidneys and collecting system appear normal without evidence of urinary tract calculus or hydronephrosis. Bladder is unremarkable. Stomach/Bowel: The stomach and small bowel are grossly unremarkable. Surgical colonic anastomosis is again identified within the lower abdomen. There is mildly prominent fluid and air-filled loops of colon within the left lower quadrant within the sigmoid colon. There is however apparent wall thickening of the distal sigmoid colon and rectum without surrounding fat stranding changes. Within the mid descending colon again identified is a possible area of focal narrowing which is nonspecific, series 4, image 70. Vascular/Lymphatic: There are no enlarged mesenteric, retroperitoneal, or pelvic lymph nodes. Again noted is a right femoral venous catheter in unchanged position. Reproductive: Scattered aortic atherosclerotic calcifications are seen without aneurysmal dilatation. Other: No evidence of abdominal wall mass or hernia. Musculoskeletal: No acute or significant osseous findings. IMPRESSION: Unchanged pneumobilia and mild common bile duct and  pancreatic ductal dilatation. Postsurgical changes within the colon with a mildly prominent fluid and air-filled sigmoid colon. There is apparent wall thickening of the distal sigmoid colon and rectum which may be due to underdistention versus mild colitis. As on prior exam again noted is a focal area of narrowing within the distal descending colon which could be due to colitis versus underlying mass lesion. On resolution of symptoms would recommend direct visualization. Aortic Atherosclerosis (ICD10-I70.0). Electronically Signed   By: BPrudencio PairM.D.   On: 08/31/2020 01:45   DG Chest Portable 1 View  Result Date: 08/30/2020 CLINICAL DATA:  Shortness of breath EXAM: PORTABLE CHEST 1 VIEW COMPARISON:  08/02/2020 FINDINGS: Cardiac shadow is stable. Postsurgical changes are noted on right. No focal infiltrate or sizable effusion is seen. No bony abnormality is noted. IMPRESSION: No acute abnormality noted. Electronically Signed   By: MInez CatalinaM.D.   On: 08/30/2020 23:19    Scheduled Meds: . amLODipine  10 mg Oral Daily  . budesonide  3 mg Oral Daily  . buPROPion  200 mg Oral Daily  . Chlorhexidine Gluconate Cloth  6 each Topical Daily  . cholecalciferol  1,000 Units Oral Daily  . cycloSPORINE  1 drop Both Eyes BID  . dicyclomine  20 mg Oral TID  . DULoxetine  90 mg Oral Daily  . estradiol  2 mg Oral Daily  . famotidine  20 mg Oral QHS  . heparin  5,000 Units Subcutaneous Q8H  . hydrALAZINE  50 mg Oral TID  . metoprolol tartrate  100 mg Oral BID  . omega-3  acid ethyl esters  1,000 mg Oral Daily  . pantoprazole  40 mg Oral Daily  . polyvinyl alcohol  1 drop Both Eyes QID  . senna-docusate  1 tablet Oral BID  . sodium chloride flush  10-40 mL Intracatheter Q12H  . sucralfate  1 g Oral TID WC & HS  . vitamin B-12  1,000 mcg Oral Daily   Continuous Infusions: . dextrose 5 % and 0.9% NaCl 75 mL/hr at 09/01/20 1209  . sulfamethoxazole-trimethoprim       LOS: 0 days   Marylu Lund,  MD [facility] Pager On Amion  If 7PM-7AM, please contact night-coverage 09/01/2020, 5:59 PM

## 2020-09-01 NOTE — ED Provider Notes (Signed)
[facility] DEPT Provider Note   CSN: [id number] Arrival date & time: 08/31/20  2242     History Chief Complaint  Patient presents with  . Abdominal Pain    Caitlin Peters is a 59 y.o. female with a history of Crohn's disease, CKD stage III, AVN, short gut syndrome, and HTN who presents to the emergency department with a chief complaint of abnormal lab result.  The patient was seen in the ER yesterday for fever, T-max 101, and generalized fatigue. She has also been having bilateral lower abdominal pain that she characterizes as cramping and sharp that worsens when she has a bowel movement. She has been having loose stools. She has a history of chronic nausea, but has had no recent vomiting. She underwent a thorough work-up with imaging, labs, and blood cultures. After shared decision-making conversation and reassuring work-up, patient requested to be discharged home with close follow-up. She was advised that she would need to return to the ER if blood cultures were positive.  She reports that she has been afebrile today. She had a follow-up with dermatology to have a scalp lesion removed earlier today. Abdominal pain and nausea have persisted. She denies URI symptoms, chest pain, shortness of breath, dysuria, flank pain, vaginal discharge, vomiting, or rash.  The history is provided by the patient and medical records. No language interpreter was used.       Past Medical History:  Diagnosis Date  . Acute pancreatitis 04/13/2020  . Anasarca 10/2019  . AVN (avascular necrosis of bone) ([place])   . Cataract   . Chronic pain syndrome   . CKD (chronic kidney disease), stage III ([place])   . Crohn disease ([place])   . Crohn disease ([place])   . Depression   . Diverticulosis   . GERD (gastroesophageal reflux disease)   . HTN (hypertension)   . IDA (iron deficiency anemia)   . Malnutrition ([place])   . Mass in chest   . Osteoporosis   . Pancreatitis   . Short gut  syndrome   . Vitamin B12 deficiency     Patient Active Problem List   Diagnosis Date Noted  . Acute metabolic encephalopathy   . Hypomagnesemia   . Partial small bowel obstruction ([place])   . Abnormal CT of the abdomen   . Bacteremia 08/04/2020  . Sepsis ([place]) 08/02/2020  . Enterobacter sepsis ([place]) 07/22/2020  . SIRS (systemic inflammatory response syndrome) ([place]) 07/19/2020  . Anxiety 06/03/2020  . Acute pancreatitis 04/13/2020  . Infection due to Stenotrophomonas maltophilia 03/10/2020  . Abnormal LFTs 03/05/2020  . Pancytopenia ([place]) 03/05/2020  . Stage 3b chronic kidney disease ([place]) 03/05/2020  . Central line infection   . On total parenteral nutrition (TPN) 01/02/2020  . Elevated liver enzymes 01/02/2020  . Cholangitis   . Anasarca 10/28/2019  . Acute kidney injury superimposed on chronic kidney disease ([place]) 10/28/2019  . Falls 10/28/2019  . Crohn disease ([place])   . Malnutrition ([place])   . Short gut syndrome   . HTN (hypertension)   . Vitamin B12 deficiency   . Osteoporosis   . Depression   . GERD (gastroesophageal reflux disease)   . Chronic pain syndrome   . Hypokalemia due to excessive gastrointestinal loss of potassium 10/13/2019  . Diarrhea   . Fever   . Hypokalemia 10/12/2019  . Chronic diarrhea 10/12/2019  . Dysuria 10/12/2019  . Bilateral lower extremity edema 10/12/2019  . Crohn's colitis ([place]) 10/12/2019  . Anemia 10/12/2019  .  Hypertension 10/12/2019  . AKI (acute kidney injury) ([place]) 10/12/2019    Past Surgical History:  Procedure Laterality Date  . ABDOMINAL HYSTERECTOMY    . ABDOMINAL SURGERY     colon resection with abdominal stoma, repair of intestinal leak, reversal of abdominal stoma  . BILIARY DILATION  11/26/2019   Procedure: BILIARY DILATION;  Surgeon: Jackquline Denmark, MD;  Location: [facility];  Service: Endoscopy;;  . BILIARY DILATION  03/08/2020   Procedure: BILIARY DILATION;  Surgeon: Irving Copas., MD;  Location: [facility];  Service: Gastroenterology;;  . BIOPSY  03/08/2020   Procedure: BIOPSY;  Surgeon: Irving Copas., MD;  Location: [facility];  Service: Gastroenterology;;  . CHEST WALL RESECTION     right thoracotomy,resection of chest mass with anterior rib and reconstruction using prosthetic mesh and video arthroscopy  . CHOLECYSTECTOMY    . COLONOSCOPY  2019  . ERCP N/A 11/26/2019   Procedure: ENDOSCOPIC RETROGRADE CHOLANGIOPANCREATOGRAPHY (ERCP);  Surgeon: Jackquline Denmark, MD;  Location: [facility];  Service: Endoscopy;  Laterality: N/A;  . ERCP N/A 03/08/2020   Procedure: ENDOSCOPIC RETROGRADE CHOLANGIOPANCREATOGRAPHY (ERCP);  Surgeon: Irving Copas., MD;  Location: [facility];  Service: Gastroenterology;  Laterality: N/A;  . ESOPHAGOGASTRODUODENOSCOPY N/A 03/08/2020   Procedure: ESOPHAGOGASTRODUODENOSCOPY (EGD);  Surgeon: Irving Copas., MD;  Location: [facility];  Service: Gastroenterology;  Laterality: N/A;  . EUS N/A 03/08/2020   Procedure: UPPER ENDOSCOPIC ULTRASOUND (EUS) LINEAR;  Surgeon: Irving Copas., MD;  Location: [facility];  Service: Gastroenterology;  Laterality: N/A;  . IR FLUORO GUIDE CV LINE LEFT  01/07/2020  . IR FLUORO GUIDE CV LINE LEFT  03/09/2020  . IR FLUORO GUIDE CV LINE LEFT  05/09/2020  . IR FLUORO GUIDE CV LINE LEFT  07/20/2020  . IR FLUORO GUIDE CV LINE RIGHT  08/05/2020  . IR PTA VENOUS EXCEPT DIALYSIS CIRCUIT  01/07/2020  . IR REMOVAL TUN CV CATH W/O FL  08/05/2020  . IR US GUIDE VASC ACCESS LEFT     x 2 06/17/19 and 09/14/2019  . IR US GUIDE VASC ACCESS RIGHT  08/05/2020  . KNEE SURGERY     right knee   . REMOVAL OF STONES  11/26/2019   Procedure: REMOVAL OF STONES;  Surgeon: Jackquline Denmark, MD;  Location: [facility];  Service: Endoscopy;;  . REMOVAL OF STONES  03/08/2020   Procedure: REMOVAL OF STONES;  Surgeon: Irving Copas., MD;  Location: [facility];  Service: Gastroenterology;;  . SMALL INTESTINE SURGERY       x3  . SPHINCTEROTOMY  11/26/2019   Procedure: SPHINCTEROTOMY;  Surgeon: Jackquline Denmark, MD;  Location: [facility];  Service: Endoscopy;;  . UPPER GASTROINTESTINAL ENDOSCOPY       OB History   No obstetric history on file.     Family History  Problem Relation Age of Onset  . Breast cancer Sister   . Multiple sclerosis Sister   . Diabetes Sister   . Lupus Sister   . Colon cancer Other   . Crohn's disease Other   . Seizures Mother   . Glaucoma Mother   . CAD Father   . Heart disease Father   . Hypertension Father     Social History   Tobacco Use  . Smoking status: Former Research scientist (life sciences)  . Smokeless tobacco: Never Used  Vaping Use  . Vaping Use: Never used  Substance Use Topics  . Alcohol use: Not Currently  . Drug use: Never    Home Medications Prior to Admission  medications   Medication Sig Start Date End Date Taking? Authorizing Provider  acetaminophen (TYLENOL) 325 MG tablet Take 650 mg by mouth every 6 (six) hours as needed for mild pain.    Yes [provider]  amLODipine (NORVASC) 10 MG tablet Take 1 tablet (10 mg total) by mouth daily. 06/23/20 09/01/20 Yes Erline Hau, MD  budesonide (ENTOCORT EC) 3 MG 24 hr capsule TAKE 3 CAPSULES BY MOUTH ONCE DAILY Patient taking differently: Take 3 mg by mouth daily.  04/05/20  Yes [name], [name], MD  buPROPion [facility] SR) 100 MG 12 hr tablet Take 2 tablets by mouth once daily Patient taking differently: Take 200 mg by mouth daily.  07/04/20  Yes [name], [name], MD  calcium carbonate (TUMS - DOSED IN MG ELEMENTAL CALCIUM) 500 MG chewable tablet Chew 1 tablet by mouth 3 (three) times daily as needed for indigestion or heartburn.   Yes [provider]  cholecalciferol (VITAMIN D3) 25 MCG (1000 UT) tablet Take 1,000 Units by mouth daily.    Yes [provider]  cycloSPORINE (RESTASIS) 0.05 % ophthalmic emulsion Place 1 drop into both eyes 2 (two) times daily.   Yes  [provider]  dexlansoprazole (DEXILANT) 60 MG capsule Take 1 capsule (60 mg total) by mouth daily. 06/28/20  Yes [name], [name], MD  Dextran 70-Hypromellose 0.1-0.3 % SOLN Place 1 drop into both eyes 4 (four) times daily.   Yes [provider]  dicyclomine (BENTYL) 20 MG tablet Take 20 mg by mouth 3 (three) times daily. 10/26/19  Yes [provider]  diphenoxylate-atropine (LOMOTIL) 2.5-0.025 MG tablet Take 1 tablet by mouth 4 (four) times daily as needed for diarrhea or loose stools. 05/24/20  Yes [name], [name], MD  DULoxetine (CYMBALTA) 30 MG capsule Take 3 capsules (90 mg total) by mouth daily. 05/24/20  Yes [name], [name], MD  estradiol (ESTRACE) 2 MG tablet Take 1 tablet (2 mg total) by mouth daily. 06/28/20  Yes [name], [name], MD  famotidine (PEPCID) 20 MG tablet Take 20 mg by mouth at bedtime.   Yes [provider]  hydrALAZINE (APRESOLINE) 50 MG tablet Take 1 tablet (50 mg total) by mouth 3 (three) times daily. 06/28/20  Yes [name], [name], MD  lipase/protease/amylase (CREON) 36000 UNITS CPEP capsule Take 1 capsule (36,000 Units total) by mouth 3 (three) times daily as needed (with meals for digestion). 06/28/20  Yes [name], [name], MD  loperamide (IMODIUM) 2 MG capsule TAKE 1 CAPSULE BY MOUTH AS NEEDED FOR DIARRHEA OR  LOOSE  STOOLS Patient taking differently: Take 2 mg by mouth as needed for diarrhea or loose stools.  07/29/20  Yes Erline Hau, MD  methadone (DOLOPHINE) 5 MG tablet Take 1 tablet (5 mg total) by mouth 5 (five) times daily. 06/30/20  Yes Florencia Reasons, MD  metoprolol tartrate (LOPRESSOR) 100 MG tablet Take 1 tablet (100 mg total) by mouth 2 (two) times daily. 06/28/20  Yes [name], [name], MD  Multiple Vitamins-Minerals (MULTIVITAMIN ADULT PO) Take 1 tablet by mouth daily.   Yes [provider]  Omega-3 1000 MG CAPS Take 1,000 mg by mouth daily.     Yes [provider]  PRESCRIPTION MEDICATION Inject 1 each into the vein daily. Home TPN . [facility] . 1 bag for 16 hours. [phone]   Yes [provider]  Probiotic Product (PROBIOTIC-10  PO) Take 1 capsule by mouth daily.    Yes [provider]  promethazine (PHENERGAN) 25 MG tablet TAKE ONE TABLET BY MOUTH EVERY 6 HOURS AS NEEDED FOR NAUSEA AND VOMITING Patient taking differently: Take 25 mg by mouth every 6 (six) hours as needed for nausea or vomiting.  08/26/20  Yes [name], [name], MD  senna-docusate (SENOKOT-S) 8.6-50 MG tablet Take 1 tablet by mouth 2 (two) times daily. 08/12/20  Yes Eugenie Filler, MD  sucralfate (CARAFATE) 1 GM/10ML suspension Take 10 mLs (1 g total) by mouth 4 (four) times daily -  with meals and at bedtime. 06/28/20  Yes [name], [name], MD  traZODone (DESYREL) 100 MG tablet Take 2 tablets (200 mg total) by mouth at bedtime as needed for sleep. 06/28/20  Yes [name], [name], MD  vitamin B-12 (CYANOCOBALAMIN) 100 MCG tablet Take 1,000 mcg by mouth daily.    Yes [provider]    Allergies    Cefepime, Gabapentin, Hyoscyamine, Lyrica [pregabalin], Meperidine, Topamax [topiramate], Zosyn [piperacillin sod-tazobactam so], Fentanyl, and Morphine and related  Review of Systems   Review of Systems  Constitutional: Positive for fatigue and fever. Negative for activity change.  HENT: Negative for congestion and sore throat.   Respiratory: Negative for cough, shortness of breath and wheezing.   Cardiovascular: Negative for chest pain.  Gastrointestinal: Positive for diarrhea and nausea. Negative for abdominal pain, anal bleeding and vomiting.  Genitourinary: Negative for dysuria and vaginal discharge.  Musculoskeletal: Negative for back pain, myalgias, neck pain and neck stiffness.  Skin: Negative for rash.  Allergic/Immunologic: Negative for immunocompromised state.    Neurological: Negative for seizures, syncope, weakness and headaches.  Psychiatric/Behavioral: Negative for confusion.    Physical Exam Updated Vital Signs BP 123/62   Pulse 67   Temp 97.9 F (36.6 C) (Oral)   Resp 17   SpO2 100%   Physical Exam Vitals and nursing note reviewed.  Constitutional:      General: She is not in acute distress.    Appearance: She is not ill-appearing, toxic-appearing or diaphoretic.     Comments: Pleasant. Nontoxic-appearing.  HENT:     Head: Normocephalic.  Eyes:     Conjunctiva/sclera: Conjunctivae normal.  Cardiovascular:     Rate and Rhythm: Normal rate and regular rhythm.     Heart sounds: No murmur heard.  No friction rub. No gallop.   Pulmonary:     Effort: Pulmonary effort is normal. No respiratory distress.     Breath sounds: No stridor. No wheezing, rhonchi or rales.  Chest:     Chest wall: No tenderness.  Abdominal:     General: There is no distension.     Palpations: Abdomen is soft. There is no mass.     Tenderness: There is abdominal tenderness. There is no right CVA tenderness, left CVA tenderness, guarding or rebound.     Hernia: No hernia is present.     Comments: Diffusely tender to palpation throughout the abdomen with voluntary guarding. There is extensive midline scarring noted to the abdomen. Normoactive bowel sounds. No rebound tenderness.  Musculoskeletal:     Cervical back: Neck supple.  Skin:    General: Skin is warm.     Findings: No rash.     Comments: Right femoral PICC line in place. No focal tenderness palpation around PICC line site.  Neurological:     Mental Status: She is alert.  Psychiatric:        Behavior:  Behavior normal.     ED Results / Procedures / Treatments   Labs (all labs ordered are listed, but only abnormal results are displayed) Labs Reviewed  COMPREHENSIVE METABOLIC PANEL - Abnormal; Notable for the following components:      Result Value   BUN 35 (*)    Creatinine, Ser 1.76 (*)     Albumin 3.0 (*)    AST 326 (*)    ALT 544 (*)    Alkaline Phosphatase 232 (*)    GFR calc non Af Amer 31 (*)    All other components within normal limits  CBC WITH DIFFERENTIAL/PLATELET - Abnormal; Notable for the following components:   RBC 2.60 (*)    Hemoglobin 8.0 (*)    HCT 24.3 (*)    Platelets 119 (*)    All other components within normal limits  CULTURE, BLOOD (ROUTINE X 2)  CULTURE, BLOOD (ROUTINE X 2)  RESPIRATORY PANEL BY RT PCR (FLU A&B, COVID)  LACTIC ACID, PLASMA  PROTIME-INR  APTT    EKG None  Radiology CT ABDOMEN PELVIS W CONTRAST  Result Date: 08/31/2020 CLINICAL DATA:  Abdominal pain fever EXAM: CT ABDOMEN AND PELVIS WITH CONTRAST TECHNIQUE: Multidetector CT imaging of the abdomen and pelvis was performed using the standard protocol following bolus administration of intravenous contrast. CONTRAST:  156m OMNIPAQUE IOHEXOL 300 MG/ML  SOLN COMPARISON:  August 09, 2020 FINDINGS: Lower chest: The visualized heart size within normal limits. No pericardial fluid/thickening. No hiatal hernia. The visualized portions of the lungs are clear. Hepatobiliary: The liver is normal in density without focal abnormality.The main portal vein is patent. Again noted is pneumobilia likely from prior postsurgical change. Mild common bile duct and intrahepatic biliary ductal dilatation is again noted. Pancreas: There is unchanged pancreatic ductal dilatation. No pancreatic ductal dilatation or surrounding inflammatory changes. Spleen: Normal in size without focal abnormality. Adrenals/Urinary Tract: Both adrenal glands appear normal. The kidneys and collecting system appear normal without evidence of urinary tract calculus or hydronephrosis. Bladder is unremarkable. Stomach/Bowel: The stomach and small bowel are grossly unremarkable. Surgical colonic anastomosis is again identified within the lower abdomen. There is mildly prominent fluid and air-filled loops of colon within the left lower  quadrant within the sigmoid colon. There is however apparent wall thickening of the distal sigmoid colon and rectum without surrounding fat stranding changes. Within the mid descending colon again identified is a possible area of focal narrowing which is nonspecific, series 4, image 70. Vascular/Lymphatic: There are no enlarged mesenteric, retroperitoneal, or pelvic lymph nodes. Again noted is a right femoral venous catheter in unchanged position. Reproductive: Scattered aortic atherosclerotic calcifications are seen without aneurysmal dilatation. Other: No evidence of abdominal wall mass or hernia. Musculoskeletal: No acute or significant osseous findings. IMPRESSION: Unchanged pneumobilia and mild common bile duct and pancreatic ductal dilatation. Postsurgical changes within the colon with a mildly prominent fluid and air-filled sigmoid colon. There is apparent wall thickening of the distal sigmoid colon and rectum which may be due to underdistention versus mild colitis. As on prior exam again noted is a focal area of narrowing within the distal descending colon which could be due to colitis versus underlying mass lesion. On resolution of symptoms would recommend direct visualization. Aortic Atherosclerosis (ICD10-I70.0). Electronically Signed   By: BPrudencio PairM.D.   On: 08/31/2020 01:45   DG Chest Portable 1 View  Result Date: 08/30/2020 CLINICAL DATA:  Shortness of breath EXAM: PORTABLE CHEST 1 VIEW COMPARISON:  08/02/2020 FINDINGS: Cardiac shadow is  stable. Postsurgical changes are noted on right. No focal infiltrate or sizable effusion is seen. No bony abnormality is noted. IMPRESSION: No acute abnormality noted. Electronically Signed   By: Inez Catalina M.D.   On: 08/30/2020 23:19    Procedures .Critical Care Performed by: Joanne Gavel, PA-C Authorized by: Joanne Gavel, PA-C   Critical care provider statement:    Critical care time (minutes):  35   Critical care time was exclusive of:   Separately billable procedures and treating other patients and teaching time   Critical care was necessary to treat or prevent imminent or life-threatening deterioration of the following conditions:  Sepsis   Critical care was time spent personally by me on the following activities:  Ordering and performing treatments and interventions, ordering and review of laboratory studies, pulse oximetry, re-evaluation of patient's condition, review of old charts, obtaining history from patient or surrogate, examination of patient, evaluation of patient's response to treatment and development of treatment plan with patient or surrogate   I assumed direction of critical care for this patient from another provider in my specialty: no     (including critical care time)  Medications Ordered in ED Medications  lactated ringers infusion ( Intravenous New Bag/Given 09/01/20 0057)  sulfamethoxazole-trimethoprim (BACTRIM) 317.44 mg in dextrose 5 % 500 mL IVPB (317.44 mg Intravenous Incomplete 09/01/20 0100)  lactated ringers bolus 1,000 mL (has no administration in time range)  HYDROmorphone (DILAUDID) injection 1 mg (1 mg Intravenous Given 09/01/20 0057)    ED Course  I have reviewed the triage vital signs and the nursing notes.  Pertinent labs & imaging results that were available during my care of the patient were reviewed by me and considered in my medical decision making (see chart for details).    MDM Rules/Calculators/A&P                          59 year old female with a history of Crohn's disease, CKD stage III, AVN, short gut syndrome, and HTN presenting to the ER after she received a call earlier today that blood cultures ordered during her ER visit yesterday were positive for Stenotrophomonas maltophilia.  Patient's medical record has been reviewed.  CT abdomen pelvis from yesterday has been reviewed with apparent wall thickening of the distal sigmoid colon and rectum, likely mild colitis versus  underdistention.  There is a focal area of narrowing of the distal descending colon thought to be colitis versus underlying mass.  Repeat imaging is not indicated at this time.  Creatinine is 1.76, up from 1.42.  Suspect a component of dehydration as urinalysis appears concentrated with elevated specific gravity.  She has no history of heart failure.  We will give bolus of lactated Ringer's.  WBC 1.7 yesterday -> 6.3 today.  Lactate is normal.  Will reorder blood cultures.  Transaminases are elevated, but downtrending from yesterday.  Spoke with pharmacy regarding positive blood cultures as patient previously was on IV Levaquin during hospitalization in August and discharged on oral Bactrim. Recently, there has been an IV Bactrim shortage. Discussed antibiotic recommendations for positive blood cultures and pharmacy recommended Bactrim, which has been dosed by pharmacy.  Consult to the hospitalist team and Dr. Hal Hope will accept the patient for admission for bacteremia.  She will most likely need to have her right femoral PICC line removed during hospitalization as it is likely the source of infection.  The patient appears reasonably stabilized for admission considering the current  resources, flow, and capabilities available in the ED at this time, and I doubt any other [facility] requiring further screening and/or treatment in the ED prior to admission.   Final Clinical Impression(s) / ED Diagnoses Final diagnoses:  Infection due to Stenotrophomonas maltophilia  Bacteremia    Rx / DC Orders ED Discharge Orders    None       Joanne Gavel, PA-C 09/01/20 0233    Mesner, Corene Cornea, MD 09/01/20 [phone]

## 2020-09-02 ENCOUNTER — Inpatient Hospital Stay (HOSPITAL_COMMUNITY): Payer: Medicare Other

## 2020-09-02 DIAGNOSIS — N1832 Chronic kidney disease, stage 3b: Secondary | ICD-10-CM

## 2020-09-02 DIAGNOSIS — R7881 Bacteremia: Secondary | ICD-10-CM | POA: Diagnosis not present

## 2020-09-02 DIAGNOSIS — Z789 Other specified health status: Secondary | ICD-10-CM

## 2020-09-02 DIAGNOSIS — K5010 Crohn's disease of large intestine without complications: Secondary | ICD-10-CM | POA: Diagnosis not present

## 2020-09-02 LAB — CBC
HCT: 22.9 % — ABNORMAL LOW (ref 36.0–46.0)
Hemoglobin: 7.4 g/dL — ABNORMAL LOW (ref 12.0–15.0)
MCH: 29.7 pg (ref 26.0–34.0)
MCHC: 32.3 g/dL (ref 30.0–36.0)
MCV: 92 fL (ref 80.0–100.0)
Platelets: 125 10*3/uL — ABNORMAL LOW (ref 150–400)
RBC: 2.49 MIL/uL — ABNORMAL LOW (ref 3.87–5.11)
RDW: 14.5 % (ref 11.5–15.5)
WBC: 3.6 10*3/uL — ABNORMAL LOW (ref 4.0–10.5)
nRBC: 0 % (ref 0.0–0.2)

## 2020-09-02 LAB — COMPREHENSIVE METABOLIC PANEL
ALT: 316 U/L — ABNORMAL HIGH (ref 0–44)
AST: 113 U/L — ABNORMAL HIGH (ref 15–41)
Albumin: 2.8 g/dL — ABNORMAL LOW (ref 3.5–5.0)
Alkaline Phosphatase: 190 U/L — ABNORMAL HIGH (ref 38–126)
Anion gap: 6 (ref 5–15)
BUN: 16 mg/dL (ref 6–20)
CO2: 24 mmol/L (ref 22–32)
Calcium: 8.8 mg/dL — ABNORMAL LOW (ref 8.9–10.3)
Chloride: 105 mmol/L (ref 98–111)
Creatinine, Ser: 1.54 mg/dL — ABNORMAL HIGH (ref 0.44–1.00)
GFR calc non Af Amer: 37 mL/min — ABNORMAL LOW (ref >60)
Glucose, Bld: 87 mg/dL (ref 70–99)
Potassium: 3.8 mmol/L (ref 3.5–5.1)
Sodium: 135 mmol/L (ref 135–145)
Total Bilirubin: 0.7 mg/dL (ref 0.3–1.2)
Total Protein: 6 g/dL — ABNORMAL LOW (ref 6.5–8.1)

## 2020-09-02 LAB — DIFFERENTIAL
Abs Immature Granulocytes: 0.02 10*3/uL (ref 0.00–0.07)
Basophils Absolute: 0 10*3/uL (ref 0.0–0.1)
Basophils Relative: 0 %
Eosinophils Absolute: 0.1 10*3/uL (ref 0.0–0.5)
Eosinophils Relative: 1 %
Immature Granulocytes: 1 %
Lymphocytes Relative: 25 %
Lymphs Abs: 0.9 10*3/uL (ref 0.7–4.0)
Monocytes Absolute: 0.3 10*3/uL (ref 0.1–1.0)
Monocytes Relative: 8 %
Neutro Abs: 2.3 10*3/uL (ref 1.7–7.7)
Neutrophils Relative %: 65 %

## 2020-09-02 LAB — CULTURE, BLOOD (ROUTINE X 2): Special Requests: ADEQUATE

## 2020-09-02 LAB — URINE CULTURE: Culture: 100 — AB

## 2020-09-02 LAB — GLUCOSE, CAPILLARY
Glucose-Capillary: 100 mg/dL — ABNORMAL HIGH (ref 70–99)
Glucose-Capillary: 102 mg/dL — ABNORMAL HIGH (ref 70–99)
Glucose-Capillary: 125 mg/dL — ABNORMAL HIGH (ref 70–99)
Glucose-Capillary: 95 mg/dL (ref 70–99)
Glucose-Capillary: 97 mg/dL (ref 70–99)

## 2020-09-02 LAB — PREALBUMIN: Prealbumin: 23.2 mg/dL (ref 18–38)

## 2020-09-02 LAB — PHOSPHORUS: Phosphorus: 2.6 mg/dL (ref 2.5–4.6)

## 2020-09-02 LAB — TRIGLYCERIDES: Triglycerides: 68 mg/dL (ref <150)

## 2020-09-02 LAB — MAGNESIUM: Magnesium: 1.6 mg/dL — ABNORMAL LOW (ref 1.7–2.4)

## 2020-09-02 MED ORDER — KATE FARMS STANDARD 1.4 PO LIQD
325.0000 mL | Freq: Every day | ORAL | Status: DC
  Administered 2020-09-03 – 2020-09-06 (×3): 325 mL via ORAL
  Filled 2020-09-02 (×12): qty 325

## 2020-09-02 MED ORDER — LIDOCAINE HCL 1 % IJ SOLN
INTRAMUSCULAR | Status: AC
  Filled 2020-09-02: qty 20

## 2020-09-02 MED ORDER — ADULT MULTIVITAMIN LIQUID CH
15.0000 mL | Freq: Every day | ORAL | Status: DC
  Administered 2020-09-03 – 2020-09-05 (×3): 15 mL via ORAL
  Filled 2020-09-02 (×8): qty 15

## 2020-09-02 MED ORDER — PROSOURCE PLUS PO LIQD
30.0000 mL | Freq: Two times a day (BID) | ORAL | Status: DC
  Administered 2020-09-02 – 2020-09-08 (×9): 30 mL via ORAL
  Filled 2020-09-02 (×13): qty 30

## 2020-09-02 MED ORDER — DIPHENOXYLATE-ATROPINE 2.5-0.025 MG PO TABS: 1.0000 | ORAL_TABLET | Freq: Four times a day (QID) | ORAL | Status: DC | PRN

## 2020-09-02 MED ORDER — LOPERAMIDE HCL 2 MG PO CAPS: 2.0000 mg | ORAL_CAPSULE | ORAL | Status: DC | PRN

## 2020-09-02 NOTE — Consult Note (Signed)
[facility] for Infectious Disease  Total days of antibiotics 3              Reason for Consult: recurrent stenotrophomonas bacteremia-line associated    Referring Physician: chiu  Principal Problem:   Sepsis ([place]) Active Problems:   Crohn's colitis ([place])   Crohn disease ([place])   On total parenteral nutrition (TPN)   Abnormal LFTs   Stage 3b chronic kidney disease ([place])   Bacteremia    HPI: Caitlin Peters is a 59 y.o. 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 On admit her blood cx from CL + steno on 10/5 and 10/6 (no concurrent peripheral cultures taken). Patient states she now feels better since starting abtx. On admit her labs showed leukopenia with wbc of 1.7, 88% neutrophils that has now normalized back to her baseline. She denies any difficulty with her line, no pain, no erythema or drainage. ID asked to weigh in whether her line can stay in since she is a difficult venous access. Her repeat blood cx on 10/7 peripheral cx is NGTD at 24hr.  She has hx of recurrent bacteremia associated with central line - in late august she had steno/enterobacter treated for 10-12 d. Then at end of this abtx course she was found to still have ongoing enterobacter cloacae bacteremia on 9/7 - she had line extracted(from left ) to right femoral on 9/10 and treated for 7 days total thru 9/14.    Past Medical History:  Diagnosis Date  . Acute pancreatitis 04/13/2020  . Anasarca 10/2019  . AVN (avascular necrosis of bone) ([place])   . Cataract   . Chronic pain syndrome   . CKD (chronic kidney disease), stage III ([place])   . Crohn disease ([place])   . Crohn disease ([place])   . Depression   . Diverticulosis   . GERD (gastroesophageal reflux disease)   . HTN (hypertension)   . IDA (iron deficiency anemia)   .  Malnutrition ([place])   . Mass in chest   . Osteoporosis   . Pancreatitis   . Short gut syndrome   . Vitamin B12 deficiency     Allergies:  Allergies  Allergen Reactions  . Cefepime Other (See Comments)    Neurotoxicity occurring in setting of AKI. Ceftriaxone tolerated during same admit  . Gabapentin Other (See Comments)    unknown  . Hyoscyamine Hives and Swelling    Legs swelling   Disorientation  . Lyrica [Pregabalin] Other (See Comments)    unknown  . Meperidine Hives    Other reaction(s): GI Upset Due to [place]   . Topamax [Topiramate] Other (See Comments)    unknown  . Zosyn [Piperacillin Sod-Tazobactam So]     Patient reports it makes her vomit, her neck stiff, and her "heart feel funny"  . Fentanyl Rash    Pt is allergic to fentanyl patch related to the glue (gives her a rash) Pt states she is NOT allergic to fentanyl IV medicine  . Morphine And Related Rash     MEDICATIONS: . (feeding supplement) PROSource Plus  30 mL Oral BID BM  . amLODipine  10 mg Oral Daily  . budesonide  3 mg Oral Daily  . buPROPion  200 mg Oral Daily  . Chlorhexidine Gluconate Cloth  6 each Topical  Daily  . cholecalciferol  1,000 Units Oral Daily  . cycloSPORINE  1 drop Both Eyes BID  . dicyclomine  20 mg Oral TID  . DULoxetine  90 mg Oral Daily  . estradiol  2 mg Oral Daily  . famotidine  20 mg Oral QHS  . feeding supplement (KATE FARMS STANDARD 1.4)  325 mL Oral Daily  . hydrALAZINE  50 mg Oral TID  . metoprolol tartrate  100 mg Oral BID  . multivitamin  15 mL Oral Daily  . omega-3 acid ethyl esters  1,000 mg Oral Daily  . pantoprazole  40 mg Oral Daily  . polyvinyl alcohol  1 drop Both Eyes QID  . senna-docusate  1 tablet Oral BID  . sodium chloride flush  10-40 mL Intracatheter Q12H  . sucralfate  1 g Oral TID WC & HS  . vitamin B-12  1,000 mcg Oral Daily    Social History   Tobacco Use  . Smoking status: Former Research scientist (life sciences)  . Smokeless tobacco: Never Used  Vaping Use    . Vaping Use: Never used  Substance Use Topics  . Alcohol use: Not Currently  . Drug use: Never    Family History  Problem Relation Age of Onset  . Breast cancer Sister   . Multiple sclerosis Sister   . Diabetes Sister   . Lupus Sister   . Colon cancer Other   . Crohn's disease Other   . Seizures Mother   . Glaucoma Mother   . CAD Father   . Heart disease Father   . Hypertension Father      Review of Systems  Constitutional: positive for fever only; chills, diaphoresis, activity change, appetite change, fatigue and unexpected weight change.  HENT: Negative for congestion, sore throat, rhinorrhea, sneezing, trouble swallowing and sinus pressure.  Eyes: Negative for photophobia and visual disturbance.  Respiratory: Negative for cough, chest tightness, shortness of breath, wheezing and stridor.  Cardiovascular: Negative for chest pain, palpitations and leg swelling.  Gastrointestinal: Negative for nausea, vomiting, abdominal pain, diarrhea, constipation, blood in stool, abdominal distention and anal bleeding.  Genitourinary: Negative for dysuria, hematuria, flank pain and difficulty urinating.  Musculoskeletal: Negative for myalgias, back pain, joint swelling, arthralgias and gait problem.  Skin: Negative for color change, pallor, rash and wound.  Neurological: Negative for dizziness, tremors, weakness and light-headedness.  Hematological: Negative for adenopathy. Does not bruise/bleed easily.  Psychiatric/Behavioral: Negative for behavioral problems, confusion, sleep disturbance, dysphoric mood, decreased concentration and agitation.     OBJECTIVE: Temp:  [98.4 F (36.9 C)-99 F (37.2 C)] 99 F (37.2 C) (10/08 1327) Pulse Rate:  [60-72] 60 (10/08 1327) Resp:  [14-19] 14 (10/08 1327) BP: (147-167)/(64-77) 160/77 (10/08 1327) SpO2:  [98 %-99 %] 99 % (10/08 1327) Physical Exam  Constitutional:  oriented to person, place, and time. appears well-developed and  well-nourished. No distress.  HENT: [place]/AT, PERRLA, no scleral icterus Mouth/Throat: Oropharynx is clear and moist. No oropharyngeal exudate.  Cardiovascular: Normal rate, regular rhythm and normal heart sounds. Exam reveals no gallop and no friction rub.  No murmur heard.  Pulmonary/Chest: Effort normal and breath sounds normal. No respiratory distress.  has no wheezes.  Neck = supple, no nuchal rigidity Abdominal: Soft. Bowel sounds are normal.  exhibits no distension. There is no tenderness.  HAL:PFXTK femoral line is c/d/i no swelling drainage near insertion Lymphadenopathy: no cervical adenopathy. No axillary adenopathy Neurological: alert and oriented to person, place, and time.  Skin: Skin is warm and dry.  No rash noted. No erythema.  Psychiatric: a normal mood and affect.  behavior is normal.    LABS: Results for orders placed or performed during the hospital encounter of 08/31/20 (from the past 48 hour(s))  Lactic acid, plasma     Status: None   Collection Time: 08/31/20 11:35 PM  Result Value Ref Range   Lactic Acid, Venous 0.5 0.5 - 1.9 mmol/L    Comment: Performed at [facility], [place] [address] [place], [place] [address]  Comprehensive metabolic panel     Status: Abnormal   Collection Time: 08/31/20 11:35 PM  Result Value Ref Range   Sodium 138 135 - 145 mmol/L   Potassium 3.8 3.5 - 5.1 mmol/L   Chloride 105 98 - 111 mmol/L   CO2 25 22 - 32 mmol/L   Glucose, Bld 87 70 - 99 mg/dL    Comment: Glucose reference range applies only to samples taken after fasting for at least 8 hours.   BUN 35 (H) 6 - 20 mg/dL   Creatinine, Ser 1.76 (H) 0.44 - 1.00 mg/dL   Calcium 9.3 8.9 - 10.3 mg/dL   Total Protein 6.9 6.5 - 8.1 g/dL   Albumin 3.0 (L) 3.5 - 5.0 g/dL   AST 326 (H) 15 - 41 U/L   ALT 544 (H) 0 - 44 U/L   Alkaline Phosphatase 232 (H) 38 - 126 U/L   Total Bilirubin 1.0 0.3 - 1.2 mg/dL   GFR calc non Af Amer 31 (L) >60 mL/min   Anion gap 8 5 - 15     Comment: Performed at [facility], [place] [address], [place], [place] [address]  CBC WITH DIFFERENTIAL     Status: Abnormal   Collection Time: 08/31/20 11:35 PM  Result Value Ref Range   WBC 6.3 4.0 - 10.5 K/uL   RBC 2.60 (L) 3.87 - 5.11 MIL/uL   Hemoglobin 8.0 (L) 12.0 - 15.0 g/dL   HCT 24.3 (L) 36 - 46 %   MCV 93.5 80.0 - 100.0 fL   MCH 30.8 26.0 - 34.0 pg   MCHC 32.9 30.0 - 36.0 g/dL   RDW 14.6 11.5 - 15.5 %   Platelets 119 (L) 150 - 400 K/uL    Comment: REPEATED TO VERIFY Immature Platelet Fraction may be clinically indicated, consider ordering this additional test [id number] CONSISTENT WITH PREVIOUS RESULT    nRBC 0.0 0.0 - 0.2 %   Neutrophils Relative % 76 %   Neutro Abs 4.8 1.7 - 7.7 K/uL   Lymphocytes Relative 16 %   Lymphs Abs 1.0 0.7 - 4.0 K/uL   Monocytes Relative 6 %   Monocytes Absolute 0.4 0.1 - 1.0 K/uL   Eosinophils Relative 2 %   Eosinophils Absolute 0.1 0 - 0 K/uL   Basophils Relative 0 %   Basophils Absolute 0.0 0 - 0 K/uL   Immature Granulocytes 0 %   Abs Immature Granulocytes 0.02 0.00 - 0.07 K/uL    Comment: Performed at [facility], [place] [address], [place], [place] [address]  Protime-INR     Status: None   Collection Time: 08/31/20 11:35 PM  Result Value Ref Range   Prothrombin Time 13.0 11.4 - 15.2 seconds   INR 1.0 0.8 - 1.2    Comment: (NOTE) INR goal varies based on device and disease states. Performed at [facility], [place] [address], [place], [place] [address]   APTT     Status: None  Collection Time: 08/31/20 11:35 PM  Result Value Ref Range   aPTT 25 24 - 36 seconds    Comment: Performed at [facility], [place] [address], [place], [place] [address]  Blood Culture (routine x 2)     Status: Abnormal   Collection Time: 08/31/20 11:35 PM   Specimen: BLOOD  Result Value Ref Range   Specimen Description      BLOOD PICC LINE Performed at [facility] [address], [place], [place] [address]    Special Requests      BOTTLES DRAWN AEROBIC AND ANAEROBIC Blood Culture adequate volume Performed at [facility] [address] [place], [place] [address]    Culture  Setup Time      GRAM NEGATIVE RODS AEROBIC BOTTLE ONLY CRITICAL VALUE NOTED.  VALUE IS CONSISTENT WITH PREVIOUSLY REPORTED AND CALLED VALUE.    Culture (A)     STENOTROPHOMONAS MALTOPHILIA SUSCEPTIBILITIES PERFORMED ON PREVIOUS CULTURE WITHIN THE LAST 5 DAYS. Performed at [facility], [place] [address], [place], [place] [address]    Report Status 09/02/2020 FINAL   Respiratory Panel by RT PCR (Flu A&B, Covid) - Nasopharyngeal Swab     Status: None   Collection Time: 09/01/20  3:34 AM   Specimen: Nasopharyngeal Swab  Result Value Ref Range   SARS Coronavirus 2 by RT PCR NEGATIVE NEGATIVE    Comment: (NOTE) SARS-CoV-2 target nucleic acids are NOT DETECTED.  The SARS-CoV-2 RNA is generally detectable in upper respiratoy specimens during the acute phase of infection. The lowest concentration of SARS-CoV-2 viral copies this assay can detect is 131 copies/mL. A negative result does not preclude SARS-Cov-2 infection and should not be used as the sole basis for treatment or other patient management decisions. A negative result may occur with  improper specimen collection/handling, submission of specimen other than nasopharyngeal swab, presence of viral mutation(s) within the areas targeted by this assay, and inadequate number of viral copies (<131 copies/mL). A negative result must be combined with clinical observations, patient history, and epidemiological information. The expected result is Negative.  Fact Sheet for Patients:  PinkCheek.be  Fact Sheet for Healthcare Providers:  GravelBags.it  This test is no t yet approved or cleared by the [place] FDA and  has been  authorized for detection and/or diagnosis of SARS-CoV-2 by FDA under an Emergency Use Authorization (EUA). This EUA will remain  in effect (meaning this test can be used) for the duration of the COVID-19 declaration under Section 564(b)(1) of the Act, 21 U.S.C. section 360bbb-3(b)(1), unless the authorization is terminated or revoked sooner.     Influenza A by PCR NEGATIVE NEGATIVE   Influenza B by PCR NEGATIVE NEGATIVE    Comment: (NOTE) The Xpert Xpress SARS-CoV-2/FLU/RSV assay is intended as an aid in  the diagnosis of influenza from Nasopharyngeal swab specimens and  should not be used as a sole basis for treatment. Nasal washings and  aspirates are unacceptable for Xpert Xpress SARS-CoV-2/FLU/RSV  testing.  Fact Sheet for Patients: PinkCheek.be  Fact Sheet for Healthcare Providers: GravelBags.it  This test is not yet approved or cleared by the [place] FDA and  has been authorized for detection and/or diagnosis of SARS-CoV-2 by  FDA under an Emergency Use Authorization (EUA). This EUA will remain  in effect (meaning this test can be used) for the duration of the  Covid-19 declaration under Section 564(b)(1) of the Act, 21  U.S.C. section 360bbb-3(b)(1), unless the authorization  is  terminated or revoked. Performed at [facility], [place] [address], [place], [place] [address]   Glucose, capillary     Status: None   Collection Time: 09/01/20  5:57 AM  Result Value Ref Range   Glucose-Capillary 89 70 - 99 mg/dL    Comment: Glucose reference range applies only to samples taken after fasting for at least 8 hours.  Blood Culture (routine x 2)     Status: None (Preliminary result)   Collection Time: 09/01/20  5:58 AM   Specimen: BLOOD LEFT HAND  Result Value Ref Range   Specimen Description      BLOOD LEFT HAND Performed at [facility] [address], [place], [place]  [address]    Special Requests      BOTTLES DRAWN AEROBIC ONLY Blood Culture adequate volume Performed at [place] [address] [place], [place] [address]    Culture      NO GROWTH < 24 HOURS Performed at [facility] [address], [place], [place] [address]    Report Status PENDING   Comprehensive metabolic panel     Status: Abnormal   Collection Time: 09/01/20  5:58 AM  Result Value Ref Range   Sodium 136 135 - 145 mmol/L   Potassium 3.6 3.5 - 5.1 mmol/L   Chloride 103 98 - 111 mmol/L   CO2 22 22 - 32 mmol/L   Glucose, Bld 97 70 - 99 mg/dL    Comment: Glucose reference range applies only to samples taken after fasting for at least 8 hours.   BUN 27 (H) 6 - 20 mg/dL   Creatinine, Ser 1.58 (H) 0.44 - 1.00 mg/dL   Calcium 9.3 8.9 - 10.3 mg/dL   Total Protein 6.5 6.5 - 8.1 g/dL   Albumin 2.8 (L) 3.5 - 5.0 g/dL   AST 259 (H) 15 - 41 U/L   ALT 458 (H) 0 - 44 U/L   Alkaline Phosphatase 211 (H) 38 - 126 U/L   Total Bilirubin 1.0 0.3 - 1.2 mg/dL   GFR calc non Af Amer 35 (L) >60 mL/min   Anion gap 11 5 - 15    Comment: Performed at [facility], [place] [address] [place], [place] [address]  Magnesium     Status: None   Collection Time: 09/01/20  5:58 AM  Result Value Ref Range   Magnesium 1.7 1.7 - 2.4 mg/dL    Comment: Performed at [facility], [place] [address] [place], [place] [address]  Phosphorus     Status: None   Collection Time: 09/01/20  5:58 AM  Result Value Ref Range   Phosphorus 2.9 2.5 - 4.6 mg/dL    Comment: Performed at [facility], [place] [address] [place], [place] [address]  CBC     Status: Abnormal   Collection Time: 09/01/20  5:58 AM  Result Value Ref Range   WBC 4.7 4.0 - 10.5 K/uL   RBC 2.58 (L) 3.87 - 5.11 MIL/uL   Hemoglobin 7.8 (L) 12.0 - 15.0 g/dL   HCT 25.0 (L) 36 - 46 %   MCV 96.9 80.0 - 100.0 fL   MCH 30.2 26.0 - 34.0 pg   MCHC 31.2 30.0 - 36.0 g/dL   RDW 14.5  11.5 - 15.5 %   Platelets 94 (L) 150 - 400 K/uL    Comment: REPEATED TO VERIFY Immature Platelet Fraction may be clinically indicated, consider ordering this  additional test [id number] CONSISTENT WITH PREVIOUS RESULT    nRBC 0.0 0.0 - 0.2 %    Comment: Performed at [facility], [place] [address], [place], [place] [address]  Glucose, capillary     Status: Abnormal   Collection Time: 09/01/20 11:33 AM  Result Value Ref Range   Glucose-Capillary 65 (L) 70 - 99 mg/dL    Comment: Glucose reference range applies only to samples taken after fasting for at least 8 hours.  Glucose, capillary     Status: None   Collection Time: 09/01/20 12:09 PM  Result Value Ref Range   Glucose-Capillary 72 70 - 99 mg/dL    Comment: Glucose reference range applies only to samples taken after fasting for at least 8 hours.  Glucose, capillary     Status: None   Collection Time: 09/01/20  5:53 PM  Result Value Ref Range   Glucose-Capillary 94 70 - 99 mg/dL    Comment: Glucose reference range applies only to samples taken after fasting for at least 8 hours.  Glucose, capillary     Status: Abnormal   Collection Time: 09/02/20 12:05 AM  Result Value Ref Range   Glucose-Capillary 100 (H) 70 - 99 mg/dL    Comment: Glucose reference range applies only to samples taken after fasting for at least 8 hours.  Prealbumin     Status: None   Collection Time: 09/02/20  3:24 AM  Result Value Ref Range   Prealbumin 23.2 18 - 38 mg/dL    Comment: Performed at [facility], [place] [address] [place], [place] [address]  Magnesium     Status: Abnormal   Collection Time: 09/02/20  3:24 AM  Result Value Ref Range   Magnesium 1.6 (L) 1.7 - 2.4 mg/dL    Comment: Performed at [facility], [place] [address], [place], [place] [address]  Phosphorus     Status: None   Collection Time: 09/02/20  3:24 AM  Result Value Ref Range   Phosphorus 2.6 2.5 - 4.6 mg/dL    Comment: Performed  at [facility], [place] [address] [place], [place] [address]  Triglycerides     Status: None   Collection Time: 09/02/20  3:24 AM  Result Value Ref Range   Triglycerides 68 <150 mg/dL    Comment: Performed at [facility], [place] [address] [place], [place] [address]  CBC     Status: Abnormal   Collection Time: 09/02/20  3:24 AM  Result Value Ref Range   WBC 3.6 (L) 4.0 - 10.5 K/uL   RBC 2.49 (L) 3.87 - 5.11 MIL/uL   Hemoglobin 7.4 (L) 12.0 - 15.0 g/dL   HCT 22.9 (L) 36 - 46 %   MCV 92.0 80.0 - 100.0 fL   MCH 29.7 26.0 - 34.0 pg   MCHC 32.3 30.0 - 36.0 g/dL   RDW 14.5 11.5 - 15.5 %   Platelets 125 (L) 150 - 400 K/uL    Comment: Immature Platelet Fraction may be clinically indicated, consider ordering this additional test [id number]    nRBC 0.0 0.0 - 0.2 %    Comment: Performed at [facility], [place] [address] [place],  [address]  Differential     Status: None   Collection Time: 09/02/20  3:24 AM  Result Value Ref Range   Neutrophils Relative % 65 %   Neutro Abs 2.3 1.7 - 7.7 K/uL   Lymphocytes Relative 25 %   Lymphs Abs  0.9 0.7 - 4.0 K/uL   Monocytes Relative 8 %   Monocytes Absolute 0.3 0.1 - 1.0 K/uL   Eosinophils Relative 1 %   Eosinophils Absolute 0.1 0 - 0 K/uL   Basophils Relative 0 %   Basophils Absolute 0.0 0 - 0 K/uL   Immature Granulocytes 1 %   Abs Immature Granulocytes 0.02 0.00 - 0.07 K/uL    Comment: Performed at [facility], [place] [address], [place], [place] [address]  Comprehensive metabolic panel     Status: Abnormal   Collection Time: 09/02/20  3:24 AM  Result Value Ref Range   Sodium 135 135 - 145 mmol/L   Potassium 3.8 3.5 - 5.1 mmol/L   Chloride 105 98 - 111 mmol/L   CO2 24 22 - 32 mmol/L   Glucose, Bld 87 70 - 99 mg/dL    Comment: Glucose reference range applies only to samples taken after fasting for at least 8 hours.   BUN 16 6 - 20 mg/dL   Creatinine, Ser 1.54 (H)  0.44 - 1.00 mg/dL   Calcium 8.8 (L) 8.9 - 10.3 mg/dL   Total Protein 6.0 (L) 6.5 - 8.1 g/dL   Albumin 2.8 (L) 3.5 - 5.0 g/dL   AST 113 (H) 15 - 41 U/L   ALT 316 (H) 0 - 44 U/L   Alkaline Phosphatase 190 (H) 38 - 126 U/L   Total Bilirubin 0.7 0.3 - 1.2 mg/dL   GFR calc non Af Amer 37 (L) >60 mL/min   Anion gap 6 5 - 15    Comment: Performed at [facility], [place] [address] [place], [place] [address]  Glucose, capillary     Status: None   Collection Time: 09/02/20  6:11 AM  Result Value Ref Range   Glucose-Capillary 95 70 - 99 mg/dL    Comment: Glucose reference range applies only to samples taken after fasting for at least 8 hours.  Glucose, capillary     Status: None   Collection Time: 09/02/20  1:31 PM  Result Value Ref Range   Glucose-Capillary 97 70 - 99 mg/dL    Comment: Glucose reference range applies only to samples taken after fasting for at least 8 hours.    MICRO: stenotrophomonas on 10/5 and 10/6 CL cultures Stenotrophomonas maltophilia      MIC    LEVOFLOXACIN 0.5 SENSITIVE  Sensitive    TRIMETH/SULFA <=20 SENSIT... Sensitive     Blood cx on 10/7 pending IMAGING: No results found.   Assessment/Plan:  59yo F with crohn's with hx of colectomy now TPN dependent with limited venous access found to have central line related stenotrophomonas bacteremia - possibly colonized line since she did not have concurrent peripheral cultures. She have 1 day of  fever plus leukopenia that would be consistent with concerns for true bacteremia.   - continue with bactrim plan to treat for 14 days, we can try to retain her line in for this admission. Can switch to oral equivalent and make sure to check cr once per week while on bactrim to ensure she doesn't get aki - recommend to repeat blood cx at end of treatment course (but it must be peripheral culture) - recommend TTE   Staph species on urine culture= asymptomatic bacturia- no need to treat.   Elzie Rings  [facility] [phone]

## 2020-09-02 NOTE — Progress Notes (Signed)
PROGRESS NOTE    Caitlin Peters  RDE:[id number] DOB: 10/27/1961 DOA: 08/31/2020 PCP: Erline Hau, MD    Brief Narrative:  59 y.o. female with history of Crohn's disease, chronic kidney disease stage III, hypertension, chronic anemia thrombocytopenia followed by gastroenterologist at [facility] recently admitted for sepsis discharged home on August 12, 2020 about 20 days ago had come to the ER a day ago for fever at home and as per the patient patient recorded fever 102 F with some chills.  Patient has chronic abdominal pain and loose stools.  Denies any chest pain shortness of breath productive cough or vomiting.  In the ER patient had a CT abdomen pelvis which did not show anything acute LFTs are markedly elevated at that time.  Blood cultures obtained and patient was discharged home.  Blood cultures grew  Enterobacter cloacae complex,Enterobacterales,Stenotrophomonas maltophilia.  Patient was advised to come back to the ER.  Patient is on TPN and has a right femoral PICC line which could be the source.  Assessment & Plan:   Principal Problem:   Sepsis ([place]) Active Problems:   Crohn's colitis ([place])   Crohn disease ([place])   On total parenteral nutrition (TPN)   Abnormal LFTs   Stage 3b chronic kidney disease ([place])   Bacteremia  1. Bacteremia with sepsis present on admit 1. Presenting fever, tachycardia, leukopenia 2. Blood cx on 10/5 thus far pos for Stenotrophomonas maltophilia 3. Chart reviewed. 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 Pt with R femoral picc placed last month. Pt known to have very difficult access with limited sites 5. Pt is continued on empiric IV bactrim 6. ID consulted regarding recurrent bacteremia.  7. IR on board in case line removal is necessary, pending ID eval.  2. Abnormal LFTs are improving 1. Tolerating diet, denies RUQ pain 2. Recheck LFT's in AM 3. Acute on chronic kidney disease stage III 1. Cr  peak at 1.76. Pt voiding well 2. On IVF hydration 3. Repeat bmet in AM 4. Chronic anemia and thrombocytopenia 1. Plts and hgb slowly trending down 2. Suspect secondary to presenting sepsis. 3. Plts now 125 and improving 4. Repeat CBC in AM 5. Hypertension 1.  continue amlodipine beta-blockers and hydralazine 2. Stable and controlled currently 6. 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   1. Being followed at [facility]. 2. TPN on hold with possible removal of PICC 3. Discussed with dietitian. OK to advance to regular diet 7. Chronic pain 1.  on methadone and presently on Dilaudid.  DVT prophylaxis: SCD's Code Status: Full Family Communication: Pt in room, family not at bedside  Status is: Inpatient  Remains inpatient appropriate because:Unsafe d/c plan, IV treatments appropriate due to intensity of illness or inability to take PO and Inpatient level of care appropriate due to severity of illness   Dispo: The patient is from: Home              Anticipated d/c is to: Home              Anticipated d/c date is: > 3 days              Patient currently is not medically stable to d/c.  Consultants:   ID  IR  Procedures:     Antimicrobials: Anti-infectives (From admission, onward)   Start     Dose/Rate Route Frequency Ordered Stop   09/01/20 1600  sulfamethoxazole-trimethoprim (BACTRIM) 320 mg in dextrose  5 % 500 mL IVPB        320 mg 346.7 mL/hr over 90 Minutes Intravenous Every 8 hours 09/01/20 0819     09/01/20 0000  sulfamethoxazole-trimethoprim (BACTRIM) 317.44 mg in dextrose 5 % 500 mL IVPB  Status:  Discontinued        15 mg/kg/day  63.5 kg 346.6 mL/hr over 90 Minutes Intravenous Every 8 hours 08/31/20 2356 09/01/20 0819      Subjective: Still complaining of abd discomfort  Objective: Vitals:   09/01/20 2133 09/02/20 0148 09/02/20 0608 09/02/20 1327  BP: (!) 149/69 (!) 147/64 (!) 167/72 (!) 160/77  Pulse: 72 67 71 60  Resp: 17 16 19 14   Temp:  98.4 F (36.9 C) 98.5 F (36.9 C) 98.9 F (37.2 C) 99 F (37.2 C)  TempSrc: Oral Oral Oral Oral  SpO2: 99% 98% 99% 99%  Weight:      Height:        Intake/Output Summary (Last 24 hours) at 09/02/2020 1521 Last data filed at 09/02/2020 1435 Gross per 24 hour  Intake 3083.82 ml  Output 0 ml  Net 3083.82 ml   Filed Weights   09/01/20 0510  Weight: 63.5 kg    Examination: General exam: Awake, laying in bed, in nad Respiratory system: Normal respiratory effort, no wheezing Cardiovascular system: regular rate, s1, s2 Gastrointestinal system: Soft, nondistended, positive BS Central nervous system: CN2-12 grossly intact, strength intact Extremities: Perfused, no clubbing Skin: Normal skin turgor, no notable skin lesions seen Psychiatry: Mood normal // no visual hallucinations   Data Reviewed: I have personally reviewed following labs and imaging studies  CBC: Recent Labs  Lab 08/30/20 2249 08/31/20 2335 09/01/20 0558 09/02/20 0324  WBC 1.7* 6.3 4.7 3.6*  NEUTROABS 1.5* 4.8  --  2.3  HGB 10.2* 8.0* 7.8* 7.4*  HCT 30.7* 24.3* 25.0* 22.9*  MCV 91.4 93.5 96.9 92.0  PLT 125* 119* 94* 401*   Basic Metabolic Panel: Recent Labs  Lab 08/30/20 2249 08/31/20 2335 09/01/20 0558 09/02/20 0324  NA 139 138 136 135  K 3.7 3.8 3.6 3.8  CL 102 105 103 105  CO2 26 25 22 24   GLUCOSE 83 87 97 87  BUN 33* 35* 27* 16  CREATININE 1.42* 1.76* 1.58* 1.54*  CALCIUM 9.2 9.3 9.3 8.8*  MG  --   --  1.7 1.6*  PHOS  --   --  2.9 2.6   GFR: Estimated Creatinine Clearance: 39.4 mL/min (A) (by C-G formula based on SCr of 1.54 mg/dL (H)). Liver Function Tests: Recent Labs  Lab 08/30/20 2249 08/31/20 2335 09/01/20 0558 09/02/20 0324  AST 973* 326* 259* 113*  ALT 573* 544* 458* 316*  ALKPHOS 219* 232* 211* 190*  BILITOT 0.9 1.0 1.0 0.7  PROT 6.7 6.9 6.5 6.0*  ALBUMIN 2.9* 3.0* 2.8* 2.8*   No results for input(s): LIPASE, AMYLASE in the last 168 hours. No results for input(s):  AMMONIA in the last 168 hours. Coagulation Profile: Recent Labs  Lab 08/31/20 2335  INR 1.0   Cardiac Enzymes: No results for input(s): CKTOTAL, CKMB, CKMBINDEX, TROPONINI in the last 168 hours. BNP (last 3 results) No results for input(s): PROBNP in the last 8760 hours. HbA1C: No results for input(s): HGBA1C in the last 72 hours. CBG: Recent Labs  Lab 09/01/20 1209 09/01/20 1753 09/02/20 0005 09/02/20 0611 09/02/20 1331  GLUCAP 72 94 100* 95 97   Lipid Profile: Recent Labs    09/02/20 0324  TRIG 68  Thyroid Function Tests: No results for input(s): TSH, T4TOTAL, FREET4, T3FREE, THYROIDAB in the last 72 hours. Anemia Panel: No results for input(s): VITAMINB12, FOLATE, FERRITIN, TIBC, IRON, RETICCTPCT in the last 72 hours. Sepsis Labs: Recent Labs  Lab 08/30/20 2315 08/31/20 2335  LATICACIDVEN 1.2 0.5    Recent Results (from the past 240 hour(s))  Blood culture (routine x 2)     Status: Abnormal   Collection Time: 08/30/20 10:50 PM   Specimen: BLOOD  Result Value Ref Range Status   Specimen Description   Final    BLOOD CENTRAL LINE Performed at [facility], [place] [address], [place], [place] [address]    Special Requests   Final    BOTTLES DRAWN AEROBIC AND ANAEROBIC Blood Culture adequate volume Performed at [facility] [address], [place], [place] [address]    Culture  Setup Time   Final    GRAM NEGATIVE RODS AEROBIC BOTTLE ONLY CRITICAL RESULT CALLED TO, READ BACK BY AND VERIFIED WITH: L ADKINS RN 08/31/20 2114 JDW Performed at [facility], [place] [address], [place], [place] [address]    Culture STENOTROPHOMONAS MALTOPHILIA (A)  Final   Report Status 09/02/2020 FINAL  Final   Organism ID, Bacteria STENOTROPHOMONAS MALTOPHILIA  Final      Susceptibility   Stenotrophomonas maltophilia - MIC*    LEVOFLOXACIN 0.5 SENSITIVE Sensitive     TRIMETH/SULFA <=20 SENSITIVE Sensitive     * STENOTROPHOMONAS  MALTOPHILIA  Blood Culture ID Panel (Reflexed)     Status: Abnormal   Collection Time: 08/30/20 10:50 PM  Result Value Ref Range Status   Enterococcus faecalis NOT DETECTED NOT DETECTED Final   Enterococcus Faecium NOT DETECTED NOT DETECTED Final   Listeria monocytogenes NOT DETECTED NOT DETECTED Final   Staphylococcus species NOT DETECTED NOT DETECTED Final   Staphylococcus aureus (BCID) NOT DETECTED NOT DETECTED Final   Staphylococcus epidermidis NOT DETECTED NOT DETECTED Final   Staphylococcus lugdunensis NOT DETECTED NOT DETECTED Final   Streptococcus species NOT DETECTED NOT DETECTED Final   Streptococcus agalactiae NOT DETECTED NOT DETECTED Final   Streptococcus pneumoniae NOT DETECTED NOT DETECTED Final   Streptococcus pyogenes NOT DETECTED NOT DETECTED Final   A.calcoaceticus-baumannii NOT DETECTED NOT DETECTED Final   Bacteroides fragilis NOT DETECTED NOT DETECTED Final   Enterobacterales NOT DETECTED NOT DETECTED Final   Enterobacter cloacae complex NOT DETECTED NOT DETECTED Final   Escherichia coli NOT DETECTED NOT DETECTED Final   Klebsiella aerogenes NOT DETECTED NOT DETECTED Final   Klebsiella oxytoca NOT DETECTED NOT DETECTED Final   Klebsiella pneumoniae NOT DETECTED NOT DETECTED Final   Proteus species NOT DETECTED NOT DETECTED Final   Salmonella species NOT DETECTED NOT DETECTED Final   Serratia marcescens NOT DETECTED NOT DETECTED Final   Haemophilus influenzae NOT DETECTED NOT DETECTED Final   Neisseria meningitidis NOT DETECTED NOT DETECTED Final   Pseudomonas aeruginosa NOT DETECTED NOT DETECTED Final   Stenotrophomonas maltophilia DETECTED (A) NOT DETECTED Final    Comment: CRITICAL RESULT CALLED TO, READ BACK BY AND VERIFIED WITH: L ADKINS RN 08/31/20 2114 JDW    Candida albicans NOT DETECTED NOT DETECTED Final   Candida auris NOT DETECTED NOT DETECTED Final   Candida glabrata NOT DETECTED NOT DETECTED Final   Candida krusei NOT DETECTED NOT DETECTED Final     Candida parapsilosis NOT DETECTED NOT DETECTED Final   Candida tropicalis NOT DETECTED NOT DETECTED Final   Cryptococcus neoformans/gattii NOT DETECTED NOT DETECTED Final  Comment: Performed at [facility], [place] [address] [place], [place] [address]  Respiratory Panel by RT PCR (Flu A&B, Covid) - Nasopharyngeal Swab     Status: None   Collection Time: 08/31/20 12:03 AM   Specimen: Nasopharyngeal Swab  Result Value Ref Range Status   SARS Coronavirus 2 by RT PCR NEGATIVE NEGATIVE Final    Comment: (NOTE) SARS-CoV-2 target nucleic acids are NOT DETECTED.  The SARS-CoV-2 RNA is generally detectable in upper respiratoy specimens during the acute phase of infection. The lowest concentration of SARS-CoV-2 viral copies this assay can detect is 131 copies/mL. A negative result does not preclude SARS-Cov-2 infection and should not be used as the sole basis for treatment or other patient management decisions. A negative result may occur with  improper specimen collection/handling, submission of specimen other than nasopharyngeal swab, presence of viral mutation(s) within the areas targeted by this assay, and inadequate number of viral copies (<131 copies/mL). A negative result must be combined with clinical observations, patient history, and epidemiological information. The expected result is Negative.  Fact Sheet for Patients:  PinkCheek.be  Fact Sheet for Healthcare Providers:  GravelBags.it  This test is no t yet approved or cleared by the [place] FDA and  has been authorized for detection and/or diagnosis of SARS-CoV-2 by FDA under an Emergency Use Authorization (EUA). This EUA will remain  in effect (meaning this test can be used) for the duration of the COVID-19 declaration under Section 564(b)(1) of the Act, 21 U.S.C. section 360bbb-3(b)(1), unless the authorization is terminated or revoked sooner.      Influenza A by PCR NEGATIVE NEGATIVE Final   Influenza B by PCR NEGATIVE NEGATIVE Final    Comment: (NOTE) The Xpert Xpress SARS-CoV-2/FLU/RSV assay is intended as an aid in  the diagnosis of influenza from Nasopharyngeal swab specimens and  should not be used as a sole basis for treatment. Nasal washings and  aspirates are unacceptable for Xpert Xpress SARS-CoV-2/FLU/RSV  testing.  Fact Sheet for Patients: PinkCheek.be  Fact Sheet for Healthcare Providers: GravelBags.it  This test is not yet approved or cleared by the [place] FDA and  has been authorized for detection and/or diagnosis of SARS-CoV-2 by  FDA under an Emergency Use Authorization (EUA). This EUA will remain  in effect (meaning this test can be used) for the duration of the  Covid-19 declaration under Section 564(b)(1) of the Act, 21  U.S.C. section 360bbb-3(b)(1), unless the authorization is  terminated or revoked. Performed at [facility], [place] [address] [place], [place] [address]   Urine culture     Status: Abnormal   Collection Time: 08/31/20  3:30 AM   Specimen: In/Out Cath Urine  Result Value Ref Range Status   Specimen Description   Final    IN/OUT CATH URINE Performed at [facility] [address], [place], [place] [address]    Special Requests   Final    NONE Performed at [facility], [place] [address], [place], [place] [address]    Culture (A)  Final    100 COLONIES/mL STAPHYLOCOCCUS EPIDERMIDIS 100 COLONIES/mL STAPHYLOCOCCUS WARNERI    Report Status 09/02/2020 FINAL  Final   Organism ID, Bacteria STAPHYLOCOCCUS EPIDERMIDIS (A)  Final   Organism ID, Bacteria STAPHYLOCOCCUS WARNERI (A)  Final      Susceptibility   Staphylococcus epidermidis - MIC*    CIPROFLOXACIN >=8 RESISTANT Resistant     GENTAMICIN 8 INTERMEDIATE Intermediate  NITROFURANTOIN <=16 SENSITIVE Sensitive      OXACILLIN >=4 RESISTANT Resistant     TETRACYCLINE <=1 SENSITIVE Sensitive     VANCOMYCIN 2 SENSITIVE Sensitive     TRIMETH/SULFA 80 RESISTANT Resistant     CLINDAMYCIN >=8 RESISTANT Resistant     RIFAMPIN <=0.5 SENSITIVE Sensitive     Inducible Clindamycin NEGATIVE Sensitive     * 100 COLONIES/mL STAPHYLOCOCCUS EPIDERMIDIS   Staphylococcus warneri - MIC*    CIPROFLOXACIN <=0.5 SENSITIVE Sensitive     GENTAMICIN <=0.5 SENSITIVE Sensitive     NITROFURANTOIN 32 SENSITIVE Sensitive     OXACILLIN <=0.25 SENSITIVE Sensitive     TETRACYCLINE <=1 SENSITIVE Sensitive     VANCOMYCIN 1 SENSITIVE Sensitive     TRIMETH/SULFA <=10 SENSITIVE Sensitive     CLINDAMYCIN >=8 RESISTANT Resistant     RIFAMPIN <=0.5 SENSITIVE Sensitive     Inducible Clindamycin NEGATIVE Sensitive     * 100 COLONIES/mL STAPHYLOCOCCUS WARNERI  Blood Culture (routine x 2)     Status: Abnormal   Collection Time: 08/31/20 11:35 PM   Specimen: BLOOD  Result Value Ref Range Status   Specimen Description   Final    BLOOD PICC LINE Performed at [facility] [address], [place], [place] [address]    Special Requests   Final    BOTTLES DRAWN AEROBIC AND ANAEROBIC Blood Culture adequate volume Performed at [facility] [address], [place], [place] [address]    Culture  Setup Time   Final    GRAM NEGATIVE RODS AEROBIC BOTTLE ONLY CRITICAL VALUE NOTED.  VALUE IS CONSISTENT WITH PREVIOUSLY REPORTED AND CALLED VALUE.    Culture (A)  Final    STENOTROPHOMONAS MALTOPHILIA SUSCEPTIBILITIES PERFORMED ON PREVIOUS CULTURE WITHIN THE LAST 5 DAYS. Performed at [facility], [place] [address], [place], [place] [address]    Report Status 09/02/2020 FINAL  Final  Respiratory Panel by RT PCR (Flu A&B, Covid) - Nasopharyngeal Swab     Status: None   Collection Time: 09/01/20  3:34 AM   Specimen: Nasopharyngeal Swab  Result Value Ref Range Status   SARS Coronavirus 2 by RT PCR NEGATIVE  NEGATIVE Final    Comment: (NOTE) SARS-CoV-2 target nucleic acids are NOT DETECTED.  The SARS-CoV-2 RNA is generally detectable in upper respiratoy specimens during the acute phase of infection. The lowest concentration of SARS-CoV-2 viral copies this assay can detect is 131 copies/mL. A negative result does not preclude SARS-Cov-2 infection and should not be used as the sole basis for treatment or other patient management decisions. A negative result may occur with  improper specimen collection/handling, submission of specimen other than nasopharyngeal swab, presence of viral mutation(s) within the areas targeted by this assay, and inadequate number of viral copies (<131 copies/mL). A negative result must be combined with clinical observations, patient history, and epidemiological information. The expected result is Negative.  Fact Sheet for Patients:  PinkCheek.be  Fact Sheet for Healthcare Providers:  GravelBags.it  This test is no t yet approved or cleared by the [place] FDA and  has been authorized for detection and/or diagnosis of SARS-CoV-2 by FDA under an Emergency Use Authorization (EUA). This EUA will remain  in effect (meaning this test can be used) for the duration of the COVID-19 declaration under Section 564(b)(1) of the Act, 21 U.S.C. section 360bbb-3(b)(1), unless the authorization is terminated or revoked sooner.     Influenza A by PCR NEGATIVE NEGATIVE Final   Influenza  B by PCR NEGATIVE NEGATIVE Final    Comment: (NOTE) The Xpert Xpress SARS-CoV-2/FLU/RSV assay is intended as an aid in  the diagnosis of influenza from Nasopharyngeal swab specimens and  should not be used as a sole basis for treatment. Nasal washings and  aspirates are unacceptable for Xpert Xpress SARS-CoV-2/FLU/RSV  testing.  Fact Sheet for Patients: PinkCheek.be  Fact Sheet for Healthcare  Providers: GravelBags.it  This test is not yet approved or cleared by the [place] FDA and  has been authorized for detection and/or diagnosis of SARS-CoV-2 by  FDA under an Emergency Use Authorization (EUA). This EUA will remain  in effect (meaning this test can be used) for the duration of the  Covid-19 declaration under Section 564(b)(1) of the Act, 21  U.S.C. section 360bbb-3(b)(1), unless the authorization is  terminated or revoked. Performed at [facility], [place] [address] [place], [place] [address]   Blood Culture (routine x 2)     Status: None (Preliminary result)   Collection Time: 09/01/20  5:58 AM   Specimen: BLOOD LEFT HAND  Result Value Ref Range Status   Specimen Description   Final    BLOOD LEFT HAND Performed at [facility] [address] [place], [place] [address]    Special Requests   Final    BOTTLES DRAWN AEROBIC ONLY Blood Culture adequate volume Performed at [facility] [address] [place], [place] [address]    Culture   Final    NO GROWTH < 24 HOURS Performed at [facility] [address], [place], [place] [address]    Report Status PENDING  Incomplete     Radiology Studies: No results found.  Scheduled Meds: . (feeding supplement) PROSource Plus  30 mL Oral BID BM  . amLODipine  10 mg Oral Daily  . budesonide  3 mg Oral Daily  . buPROPion  200 mg Oral Daily  . Chlorhexidine Gluconate Cloth  6 each Topical Daily  . cholecalciferol  1,000 Units Oral Daily  . cycloSPORINE  1 drop Both Eyes BID  . dicyclomine  20 mg Oral TID  . DULoxetine  90 mg Oral Daily  . estradiol  2 mg Oral Daily  . famotidine  20 mg Oral QHS  . feeding supplement ([employer] STANDARD 1.4)  325 mL Oral Daily  . hydrALAZINE  50 mg Oral TID  . metoprolol tartrate  100 mg Oral BID  . multivitamin  15 mL Oral Daily  . omega-3 acid ethyl esters  1,000 mg Oral Daily  .  pantoprazole  40 mg Oral Daily  . polyvinyl alcohol  1 drop Both Eyes QID  . senna-docusate  1 tablet Oral BID  . sodium chloride flush  10-40 mL Intracatheter Q12H  . sucralfate  1 g Oral TID WC & HS  . vitamin B-12  1,000 mcg Oral Daily   Continuous Infusions: . dextrose 5 % and 0.9% NaCl 75 mL/hr at 09/02/20 0600  . sulfamethoxazole-trimethoprim 320 mg (09/02/20 0901)     LOS: 1 day   Marylu Lund, MD [facility] Pager On Amion  If 7PM-7AM, please contact night-coverage 09/02/2020, 3:21 PM

## 2020-09-02 NOTE — Progress Notes (Signed)
Initial Nutrition Assessment  DOCUMENTATION CODES:   Not applicable  INTERVENTION:  - will liberalize diet from Heart Healthy to Regular (approved by MD). - will order Dillard [place] 1.4 po once/day, each supplement provides 455 kcal and 20 grams protein.  - will order 30 ml Prosource Plus BID, each supplement provides 100 kcal and 15 grams protein.  - will order 15 ml multivitamin/day.  - will complete NFPE when feasible. - will monitor for TPN re-start.   NUTRITION DIAGNOSIS:   Inadequate oral intake related to altered GI function, nausea as evidenced by per patient/family report.  GOAL:   Patient will meet greater than or equal to 90% of their needs  MONITOR:   PO intake, Supplement acceptance, Labs, Weight trends  REASON FOR ASSESSMENT:   Consult Assessment of nutrition requirement/status  ASSESSMENT:   59 y.o. female with medical history of Crohn's disease, stage 3 CKD, HTN, chronic anemia, and thrombocytopenia. She is followed by GI at [facility] and was recently admitted for sepsis and discharged home on 9/17. Of note, she has chronic abdominal pain and loose stools. In the ED, CT abdomen/pelvis showed no acute processes. Blood cultures were positive for 3 strains. She is on home TPN and has a R femoral PICC.  No intakes documented since admission. Patient reports pain and nausea during RD visit and that provided phenergan has not been helpful. She reports that she threw up after taking some of her oral medications and that she has not had anything to eat since admission.   At home she receives TPN as the main source of nutrition. She does eat and drink but intakes are variable from day-to-day. Her daughter cooks items from scratch and some days patient is able to consume an item without issue and then the next time she attempts to consume that item she has discomfort or change in bowel habits.   She is concerned about losing weight during admission with not receiving TPN  and being unable to eat much.   Weight yesterday was 140 lb. Weight on 08/08/20 was 145 lb and weight had been stable since 7/29. This indicates 5 lb weight loss (2.7% body weight) in the past 3 weeks; not significant for time frame.     Labs reviewed; CBGs: 100 and 95 mg/dl, creatinine: 1.54 mg/dl, Mg: 1.6 mg/dl, alk phos elevated, LFTs elevated, GFR: 37 ml/min.  Medications reviewed; 1000 units cholecalciferol/day, 20 mg oral pepcid/day, PRN creon, 40 mg oral protonix/day, 1000 mg lovaza/day, 1 tablet senokot BID, 1 g carafate QID, 1000 mcg oral cyanocobalamin/day.  IVF; D5-NS @ 75 ml/hr (306 kcal).     NUTRITION - FOCUSED PHYSICAL EXAM:  deferred d/t pain and nausea.   Diet Order:   Diet Order            Diet regular Room service appropriate? Yes; Fluid consistency: Thin  Diet effective now                 EDUCATION NEEDS:   No education needs have been identified at this time  Skin:  Skin Assessment: Reviewed RN Assessment  Last BM:  10/7  Height:   Ht Readings from Last 1 Encounters:  09/01/20 5' 8"  (1.727 m)    Weight:   Wt Readings from Last 1 Encounters:  09/01/20 63.5 kg    Estimated Nutritional Needs:  Kcal:  1905-2100 kcal Protein:  95-110 grams Fluid:  >/= 2 L/day     Jarome Matin, MS, RD, LDN, CNSC Inpatient Clinical  Dietitian RD pager # available in [place]  After hours/weekend pager # available in [facility]

## 2020-09-03 ENCOUNTER — Inpatient Hospital Stay (HOSPITAL_COMMUNITY): Payer: Medicare Other

## 2020-09-03 DIAGNOSIS — K50819 Crohn's disease of both small and large intestine with unspecified complications: Secondary | ICD-10-CM | POA: Diagnosis not present

## 2020-09-03 DIAGNOSIS — R7881 Bacteremia: Secondary | ICD-10-CM

## 2020-09-03 LAB — CBC
HCT: 24.3 % — ABNORMAL LOW (ref 36.0–46.0)
Hemoglobin: 8 g/dL — ABNORMAL LOW (ref 12.0–15.0)
MCH: 29.6 pg (ref 26.0–34.0)
MCHC: 32.9 g/dL (ref 30.0–36.0)
MCV: 90 fL (ref 80.0–100.0)
Platelets: 138 10*3/uL — ABNORMAL LOW (ref 150–400)
RBC: 2.7 MIL/uL — ABNORMAL LOW (ref 3.87–5.11)
RDW: 14.3 % (ref 11.5–15.5)
WBC: 2.7 10*3/uL — ABNORMAL LOW (ref 4.0–10.5)
nRBC: 0 % (ref 0.0–0.2)

## 2020-09-03 LAB — COMPREHENSIVE METABOLIC PANEL
ALT: 244 U/L — ABNORMAL HIGH (ref 0–44)
AST: 56 U/L — ABNORMAL HIGH (ref 15–41)
Albumin: 3 g/dL — ABNORMAL LOW (ref 3.5–5.0)
Alkaline Phosphatase: 203 U/L — ABNORMAL HIGH (ref 38–126)
Anion gap: 9 (ref 5–15)
BUN: 11 mg/dL (ref 6–20)
CO2: 23 mmol/L (ref 22–32)
Calcium: 9.5 mg/dL (ref 8.9–10.3)
Chloride: 107 mmol/L (ref 98–111)
Creatinine, Ser: 1.47 mg/dL — ABNORMAL HIGH (ref 0.44–1.00)
GFR, Estimated: 39 mL/min — ABNORMAL LOW (ref >60)
Glucose, Bld: 94 mg/dL (ref 70–99)
Potassium: 3.5 mmol/L (ref 3.5–5.1)
Sodium: 139 mmol/L (ref 135–145)
Total Bilirubin: 0.8 mg/dL (ref 0.3–1.2)
Total Protein: 6.6 g/dL (ref 6.5–8.1)

## 2020-09-03 LAB — GLUCOSE, CAPILLARY
Glucose-Capillary: 94 mg/dL (ref 70–99)
Glucose-Capillary: 87 mg/dL (ref 70–99)
Glucose-Capillary: 83 mg/dL (ref 70–99)
Glucose-Capillary: 110 mg/dL — ABNORMAL HIGH (ref 70–99)

## 2020-09-03 LAB — ECHOCARDIOGRAM COMPLETE
Area-P 1/2: 2.76 cm2
Height: 68 in
S' Lateral: 2.65 cm
Weight: 2240.01 oz

## 2020-09-03 MED ORDER — INSULIN ASPART 100 UNIT/ML ~~LOC~~ SOLN: 0.0000 [IU] | SUBCUTANEOUS | Status: DC

## 2020-09-03 MED ORDER — TRAVASOL 10 % IV SOLN
INTRAVENOUS | Status: AC
  Filled 2020-09-03: qty 756

## 2020-09-03 MED ORDER — MAGNESIUM SULFATE 2 GM/50ML IV SOLN
2.0000 g | Freq: Once | INTRAVENOUS | Status: AC
  Administered 2020-09-03: 2 g via INTRAVENOUS
  Filled 2020-09-03: qty 50

## 2020-09-03 MED ORDER — DEXTROSE-NACL 5-0.9 % IV SOLN: INTRAVENOUS | Status: AC

## 2020-09-03 MED ORDER — ONDANSETRON HCL 4 MG/2ML IJ SOLN
4.0000 mg | Freq: Four times a day (QID) | INTRAMUSCULAR | Status: DC | PRN
  Administered 2020-09-03 – 2020-09-12 (×28): 4 mg via INTRAVENOUS
  Filled 2020-09-03 (×28): qty 2

## 2020-09-03 MED ORDER — POTASSIUM CHLORIDE 10 MEQ/100ML IV SOLN
10.0000 meq | INTRAVENOUS | Status: AC
  Administered 2020-09-03 (×4): 10 meq via INTRAVENOUS
  Filled 2020-09-03 (×4): qty 100

## 2020-09-03 NOTE — Plan of Care (Signed)
  Problem: Education: Goal: Knowledge of General Education information will improve Description: Including pain rating scale, medication(s)/side effects and non-pharmacologic comfort measures Outcome: Progressing   Problem: Clinical Measurements: Goal: Will remain free from infection Outcome: Progressing   Problem: Clinical Measurements: Goal: Diagnostic test results will improve Outcome: Progressing

## 2020-09-03 NOTE — Progress Notes (Signed)
PROGRESS NOTE    Caitlin Peters  TOI:[id number] DOB: 24-Feb-1961 DOA: 08/31/2020 PCP: Erline Hau, MD    Brief Narrative:  59 y.o. female with history of Crohn's disease, chronic kidney disease stage III, hypertension, chronic anemia thrombocytopenia followed by gastroenterologist at [facility] recently admitted for sepsis discharged home on August 12, 2020 about 20 days ago had come to the ER a day ago for fever at home and as per the patient patient recorded fever 102 F with some chills.  Patient has chronic abdominal pain and loose stools.  Denies any chest pain shortness of breath productive cough or vomiting.  In the ER patient had a CT abdomen pelvis which did not show anything acute LFTs are markedly elevated at that time.  Blood cultures obtained and patient was discharged home.  Blood cultures grew  Enterobacter cloacae complex,Enterobacterales,Stenotrophomonas maltophilia.  Patient was advised to come back to the ER.  Patient is on TPN and has a right femoral PICC line which could be the source.  Assessment & Plan:   Principal Problem:   Sepsis ([place]) Active Problems:   Crohn's colitis ([place])   Crohn disease ([place])   On total parenteral nutrition (TPN)   Abnormal LFTs   Stage 3b chronic kidney disease ([place])   Bacteremia  1. Bacteremia with sepsis present on admit 1. Presenting fever, tachycardia, leukopenia 2. Blood cx on 10/5 thus far pos for Stenotrophomonas maltophilia 3. Chart reviewed. 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 Pt with R femoral picc placed last month. Pt known to have very difficult access with limited sites 5. Pt is continued on empiric IV bactrim 6. ID consulted regarding recurrent bacteremia. Recommendation to keep PICC in place 7. 2d echo ordered and results reviewed. Finding of normal LVEF. No mention of vegetation 8. Per ID, will need repeat peripheral blood culture at the end of treatment  course 9. Plan for bactrim x 14 days 2. Abnormal LFTs are improving 1. Tolerating diet, denies RUQ pain 2. Repeat lft in AM 3. Acute on chronic kidney disease stage III 1. Cr peak at 1.47. Pt voiding well 2. On IVF hydration 3. Recheck bmet in AM 4. Chronic anemia and thrombocytopenia 1. Suspect secondary to presenting sepsis. 2. Plts up to 138 3. Repeat CBC in AM 5. Hypertension 1.  continue amlodipine beta-blockers and hydralazine 2. Stable and controlled currently 6. 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   1. Being followed at [facility]. 2. TPN presently on hold 3. Now on regular diet 4. Would defer to ID when is OK to resume TPN in light of bacteremia 7. Chronic pain 1.  on methadone and presently on Dilaudid.  DVT prophylaxis: SCD's Code Status: Full Family Communication: Pt in room, family not at bedside  Status is: Inpatient  Remains inpatient appropriate because:Unsafe d/c plan, IV treatments appropriate due to intensity of illness or inability to take PO and Inpatient level of care appropriate due to severity of illness   Dispo: The patient is from: Home              Anticipated d/c is to: Home              Anticipated d/c date is: > 3 days              Patient currently is not medically stable to d/c.  Consultants:   ID  IR  Procedures:     Antimicrobials: Anti-infectives (  From admission, onward)   Start     Dose/Rate Route Frequency Ordered Stop   09/01/20 1600  sulfamethoxazole-trimethoprim (BACTRIM) 320 mg in dextrose 5 % 500 mL IVPB        320 mg 346.7 mL/hr over 90 Minutes Intravenous Every 8 hours 09/01/20 0819     09/01/20 0000  sulfamethoxazole-trimethoprim (BACTRIM) 317.44 mg in dextrose 5 % 500 mL IVPB  Status:  Discontinued        15 mg/kg/day  63.5 kg 346.6 mL/hr over 90 Minutes Intravenous Every 8 hours 08/31/20 2356 09/01/20 0819      Subjective: Still with abd discomfort this AM  Objective: Vitals:   09/02/20 1327  09/02/20 2157 09/03/20 0547 09/03/20 1421  BP: (!) 160/77 (!) 152/67 (!) 157/67 135/61  Pulse: 60 70 64 (!) 57  Resp: 14 16 18 17   Temp: 99 F (37.2 C) 99 F (37.2 C) 99.1 F (37.3 C) 98.4 F (36.9 C)  TempSrc: Oral   Oral  SpO2: 99% 100% 99% 99%  Weight:      Height:        Intake/Output Summary (Last 24 hours) at 09/03/2020 1519 Last data filed at 09/03/2020 1441 Gross per 24 hour  Intake 4923.94 ml  Output 0 ml  Net 4923.94 ml   Filed Weights   09/01/20 0510  Weight: 63.5 kg    Examination: General exam: Conversant, in no acute distress Respiratory system: normal chest rise, clear, no audible wheezing Cardiovascular system: regular rhythm, s1-s2 Gastrointestinal system: Nondistended, nontender, pos BS Central nervous system: No seizures, no tremors Extremities: No cyanosis, no joint deformities Skin: No rashes, no pallor Psychiatry: Affect normal // no auditory hallucinations   Data Reviewed: I have personally reviewed following labs and imaging studies  CBC: Recent Labs  Lab 08/30/20 2249 08/31/20 2335 09/01/20 0558 09/02/20 0324 09/03/20 0350  WBC 1.7* 6.3 4.7 3.6* 2.7*  NEUTROABS 1.5* 4.8  --  2.3  --   HGB 10.2* 8.0* 7.8* 7.4* 8.0*  HCT 30.7* 24.3* 25.0* 22.9* 24.3*  MCV 91.4 93.5 96.9 92.0 90.0  PLT 125* 119* 94* 125* 384*   Basic Metabolic Panel: Recent Labs  Lab 08/30/20 2249 08/31/20 2335 09/01/20 0558 09/02/20 0324 09/03/20 0350  NA 139 138 136 135 139  K 3.7 3.8 3.6 3.8 3.5  CL 102 105 103 105 107  CO2 26 25 22 24 23   GLUCOSE 83 87 97 87 94  BUN 33* 35* 27* 16 11  CREATININE 1.42* 1.76* 1.58* 1.54* 1.47*  CALCIUM 9.2 9.3 9.3 8.8* 9.5  MG  --   --  1.7 1.6*  --   PHOS  --   --  2.9 2.6  --    GFR: Estimated Creatinine Clearance: 41.3 mL/min (A) (by C-G formula based on SCr of 1.47 mg/dL (H)). Liver Function Tests: Recent Labs  Lab 08/30/20 2249 08/31/20 2335 09/01/20 0558 09/02/20 0324 09/03/20 0350  AST 973* 326* 259* 113*  56*  ALT 573* 544* 458* 316* 244*  ALKPHOS 219* 232* 211* 190* 203*  BILITOT 0.9 1.0 1.0 0.7 0.8  PROT 6.7 6.9 6.5 6.0* 6.6  ALBUMIN 2.9* 3.0* 2.8* 2.8* 3.0*   No results for input(s): LIPASE, AMYLASE in the last 168 hours. No results for input(s): AMMONIA in the last 168 hours. Coagulation Profile: Recent Labs  Lab 08/31/20 2335  INR 1.0   Cardiac Enzymes: No results for input(s): CKTOTAL, CKMB, CKMBINDEX, TROPONINI in the last 168 hours. BNP (last 3 results) No  results for input(s): PROBNP in the last 8760 hours. HbA1C: No results for input(s): HGBA1C in the last 72 hours. CBG: Recent Labs  Lab 09/02/20 1331 09/02/20 1813 09/02/20 2353 09/03/20 0545 09/03/20 1158  GLUCAP 97 125* 102* 94 87   Lipid Profile: Recent Labs    09/02/20 0324  TRIG 68   Thyroid Function Tests: No results for input(s): TSH, T4TOTAL, FREET4, T3FREE, THYROIDAB in the last 72 hours. Anemia Panel: No results for input(s): VITAMINB12, FOLATE, FERRITIN, TIBC, IRON, RETICCTPCT in the last 72 hours. Sepsis Labs: Recent Labs  Lab 08/30/20 2315 08/31/20 2335  LATICACIDVEN 1.2 0.5    Recent Results (from the past 240 hour(s))  Blood culture (routine x 2)     Status: Abnormal   Collection Time: 08/30/20 10:50 PM   Specimen: BLOOD  Result Value Ref Range Status   Specimen Description   Final    BLOOD CENTRAL LINE Performed at [facility], [place] [address] [place], [place] [address]    Special Requests   Final    BOTTLES DRAWN AEROBIC AND ANAEROBIC Blood Culture adequate volume Performed at [place] [address] [place], [place] [address]    Culture  Setup Time   Final    GRAM NEGATIVE RODS AEROBIC BOTTLE ONLY CRITICAL RESULT CALLED TO, READ BACK BY AND VERIFIED WITH: L ADKINS RN 08/31/20 2114 JDW Performed at [facility], [place] [address], [place],  [address]    Culture STENOTROPHOMONAS MALTOPHILIA (A)  Final   Report Status  09/02/2020 FINAL  Final   Organism ID, Bacteria STENOTROPHOMONAS MALTOPHILIA  Final      Susceptibility   Stenotrophomonas maltophilia - MIC*    LEVOFLOXACIN 0.5 SENSITIVE Sensitive     TRIMETH/SULFA <=20 SENSITIVE Sensitive     * STENOTROPHOMONAS MALTOPHILIA  Blood Culture ID Panel (Reflexed)     Status: Abnormal   Collection Time: 08/30/20 10:50 PM  Result Value Ref Range Status   Enterococcus faecalis NOT DETECTED NOT DETECTED Final   Enterococcus Faecium NOT DETECTED NOT DETECTED Final   Listeria monocytogenes NOT DETECTED NOT DETECTED Final   Staphylococcus species NOT DETECTED NOT DETECTED Final   Staphylococcus aureus (BCID) NOT DETECTED NOT DETECTED Final   Staphylococcus epidermidis NOT DETECTED NOT DETECTED Final   Staphylococcus lugdunensis NOT DETECTED NOT DETECTED Final   Streptococcus species NOT DETECTED NOT DETECTED Final   Streptococcus agalactiae NOT DETECTED NOT DETECTED Final   Streptococcus pneumoniae NOT DETECTED NOT DETECTED Final   Streptococcus pyogenes NOT DETECTED NOT DETECTED Final   A.calcoaceticus-baumannii NOT DETECTED NOT DETECTED Final   Bacteroides fragilis NOT DETECTED NOT DETECTED Final   Enterobacterales NOT DETECTED NOT DETECTED Final   Enterobacter cloacae complex NOT DETECTED NOT DETECTED Final   Escherichia coli NOT DETECTED NOT DETECTED Final   Klebsiella aerogenes NOT DETECTED NOT DETECTED Final   Klebsiella oxytoca NOT DETECTED NOT DETECTED Final   Klebsiella pneumoniae NOT DETECTED NOT DETECTED Final   Proteus species NOT DETECTED NOT DETECTED Final   Salmonella species NOT DETECTED NOT DETECTED Final   Serratia marcescens NOT DETECTED NOT DETECTED Final   Haemophilus influenzae NOT DETECTED NOT DETECTED Final   Neisseria meningitidis NOT DETECTED NOT DETECTED Final   Pseudomonas aeruginosa NOT DETECTED NOT DETECTED Final   Stenotrophomonas maltophilia DETECTED (A) NOT DETECTED Final    Comment: CRITICAL RESULT CALLED TO, READ BACK BY  AND VERIFIED WITH: L ADKINS RN 08/31/20 2114 JDW    Candida albicans NOT DETECTED  NOT DETECTED Final   Candida auris NOT DETECTED NOT DETECTED Final   Candida glabrata NOT DETECTED NOT DETECTED Final   Candida krusei NOT DETECTED NOT DETECTED Final   Candida parapsilosis NOT DETECTED NOT DETECTED Final   Candida tropicalis NOT DETECTED NOT DETECTED Final   Cryptococcus neoformans/gattii NOT DETECTED NOT DETECTED Final    Comment: Performed at [facility], [place] [address] [name], [place] [address]  Respiratory Panel by RT PCR (Flu A&B, Covid) - Nasopharyngeal Swab     Status: None   Collection Time: 08/31/20 12:03 AM   Specimen: Nasopharyngeal Swab  Result Value Ref Range Status   SARS Coronavirus 2 by RT PCR NEGATIVE NEGATIVE Final    Comment: (NOTE) SARS-CoV-2 target nucleic acids are NOT DETECTED.  The SARS-CoV-2 RNA is generally detectable in upper respiratoy specimens during the acute phase of infection. The lowest concentration of SARS-CoV-2 viral copies this assay can detect is 131 copies/mL. A negative result does not preclude SARS-Cov-2 infection and should not be used as the sole basis for treatment or other patient management decisions. A negative result may occur with  improper specimen collection/handling, submission of specimen other than nasopharyngeal swab, presence of viral mutation(s) within the areas targeted by this assay, and inadequate number of viral copies (<131 copies/mL). A negative result must be combined with clinical observations, patient history, and epidemiological information. The expected result is Negative.  Fact Sheet for Patients:  PinkCheek.be  Fact Sheet for Healthcare Providers:  GravelBags.it  This test is no t yet approved or cleared by the [place] FDA and  has been authorized for detection and/or diagnosis of SARS-CoV-2 by FDA under an Emergency Use Authorization  (EUA). This EUA will remain  in effect (meaning this test can be used) for the duration of the COVID-19 declaration under Section 564(b)(1) of the Act, 21 U.S.C. section 360bbb-3(b)(1), unless the authorization is terminated or revoked sooner.     Influenza A by PCR NEGATIVE NEGATIVE Final   Influenza B by PCR NEGATIVE NEGATIVE Final    Comment: (NOTE) The Xpert Xpress SARS-CoV-2/FLU/RSV assay is intended as an aid in  the diagnosis of influenza from Nasopharyngeal swab specimens and  should not be used as a sole basis for treatment. Nasal washings and  aspirates are unacceptable for Xpert Xpress SARS-CoV-2/FLU/RSV  testing.  Fact Sheet for Patients: PinkCheek.be  Fact Sheet for Healthcare Providers: GravelBags.it  This test is not yet approved or cleared by the [place] FDA and  has been authorized for detection and/or diagnosis of SARS-CoV-2 by  FDA under an Emergency Use Authorization (EUA). This EUA will remain  in effect (meaning this test can be used) for the duration of the  Covid-19 declaration under Section 564(b)(1) of the Act, 21  U.S.C. section 360bbb-3(b)(1), unless the authorization is  terminated or revoked. Performed at [facility], [place] [address], [place], [place] [address]   Urine culture     Status: Abnormal   Collection Time: 08/31/20  3:30 AM   Specimen: In/Out Cath Urine  Result Value Ref Range Status   Specimen Description   Final    IN/OUT CATH URINE Performed at [facility] [address] [place], [place] [address]    Special Requests   Final    NONE Performed at [facility], [place] [address] [place], [place] [address]    Culture (A)  Final    100 COLONIES/mL STAPHYLOCOCCUS EPIDERMIDIS 100 COLONIES/mL STAPHYLOCOCCUS WARNERI  Report Status 09/02/2020 FINAL  Final   Organism ID, Bacteria STAPHYLOCOCCUS EPIDERMIDIS (A)   Final   Organism ID, Bacteria STAPHYLOCOCCUS WARNERI (A)  Final      Susceptibility   Staphylococcus epidermidis - MIC*    CIPROFLOXACIN >=8 RESISTANT Resistant     GENTAMICIN 8 INTERMEDIATE Intermediate     NITROFURANTOIN <=16 SENSITIVE Sensitive     OXACILLIN >=4 RESISTANT Resistant     TETRACYCLINE <=1 SENSITIVE Sensitive     VANCOMYCIN 2 SENSITIVE Sensitive     TRIMETH/SULFA 80 RESISTANT Resistant     CLINDAMYCIN >=8 RESISTANT Resistant     RIFAMPIN <=0.5 SENSITIVE Sensitive     Inducible Clindamycin NEGATIVE Sensitive     * 100 COLONIES/mL STAPHYLOCOCCUS EPIDERMIDIS   Staphylococcus warneri - MIC*    CIPROFLOXACIN <=0.5 SENSITIVE Sensitive     GENTAMICIN <=0.5 SENSITIVE Sensitive     NITROFURANTOIN 32 SENSITIVE Sensitive     OXACILLIN <=0.25 SENSITIVE Sensitive     TETRACYCLINE <=1 SENSITIVE Sensitive     VANCOMYCIN 1 SENSITIVE Sensitive     TRIMETH/SULFA <=10 SENSITIVE Sensitive     CLINDAMYCIN >=8 RESISTANT Resistant     RIFAMPIN <=0.5 SENSITIVE Sensitive     Inducible Clindamycin NEGATIVE Sensitive     * 100 COLONIES/mL STAPHYLOCOCCUS WARNERI  Blood Culture (routine x 2)     Status: Abnormal   Collection Time: 08/31/20 11:35 PM   Specimen: BLOOD  Result Value Ref Range Status   Specimen Description   Final    BLOOD PICC LINE Performed at [facility], [place] [address], [place], [place] [address]    Special Requests   Final    BOTTLES DRAWN AEROBIC AND ANAEROBIC Blood Culture adequate volume Performed at [facility] [address], [place], [place] [address]    Culture  Setup Time   Final    GRAM NEGATIVE RODS AEROBIC BOTTLE ONLY CRITICAL VALUE NOTED.  VALUE IS CONSISTENT WITH PREVIOUSLY REPORTED AND CALLED VALUE.    Culture (A)  Final    STENOTROPHOMONAS MALTOPHILIA SUSCEPTIBILITIES PERFORMED ON PREVIOUS CULTURE WITHIN THE LAST 5 DAYS. Performed at [facility], [place] [address] [place] [address], [place], [place] [address]    Report  Status 09/02/2020 FINAL  Final  Respiratory Panel by RT PCR (Flu A&B, Covid) - Nasopharyngeal Swab     Status: None   Collection Time: 09/01/20  3:34 AM   Specimen: Nasopharyngeal Swab  Result Value Ref Range Status   SARS Coronavirus 2 by RT PCR NEGATIVE NEGATIVE Final    Comment: (NOTE) SARS-CoV-2 target nucleic acids are NOT DETECTED.  The SARS-CoV-2 RNA is generally detectable in upper respiratoy specimens during the acute phase of infection. The lowest concentration of SARS-CoV-2 viral copies this assay can detect is 131 copies/mL. A negative result does not preclude SARS-Cov-2 infection and should not be used as the sole basis for treatment or other patient management decisions. A negative result may occur with  improper specimen collection/handling, submission of specimen other than nasopharyngeal swab, presence of viral mutation(s) within the areas targeted by this assay, and inadequate number of viral copies (<131 copies/mL). A negative result must be combined with clinical observations, patient history, and epidemiological information. The expected result is Negative.  Fact Sheet for Patients:  PinkCheek.be  Fact Sheet for Healthcare Providers:  GravelBags.it  This test is no t yet approved or cleared by the [place] FDA and  has been authorized for detection and/or diagnosis of SARS-CoV-2 by FDA under  an Emergency Use Authorization (EUA). This EUA will remain  in effect (meaning this test can be used) for the duration of the COVID-19 declaration under Section 564(b)(1) of the Act, 21 U.S.C. section 360bbb-3(b)(1), unless the authorization is terminated or revoked sooner.     Influenza A by PCR NEGATIVE NEGATIVE Final   Influenza B by PCR NEGATIVE NEGATIVE Final    Comment: (NOTE) The Xpert Xpress SARS-CoV-2/FLU/RSV assay is intended as an aid in  the diagnosis of influenza from Nasopharyngeal swab  specimens and  should not be used as a sole basis for treatment. Nasal washings and  aspirates are unacceptable for Xpert Xpress SARS-CoV-2/FLU/RSV  testing.  Fact Sheet for Patients: PinkCheek.be  Fact Sheet for Healthcare Providers: GravelBags.it  This test is not yet approved or cleared by the [place] FDA and  has been authorized for detection and/or diagnosis of SARS-CoV-2 by  FDA under an Emergency Use Authorization (EUA). This EUA will remain  in effect (meaning this test can be used) for the duration of the  Covid-19 declaration under Section 564(b)(1) of the Act, 21  U.S.C. section 360bbb-3(b)(1), unless the authorization is  terminated or revoked. Performed at [facility], [place] [address] [place], [place] [address]   Blood Culture (routine x 2)     Status: None (Preliminary result)   Collection Time: 09/01/20  5:58 AM   Specimen: BLOOD LEFT HAND  Result Value Ref Range Status   Specimen Description   Final    BLOOD LEFT HAND Performed at [facility] [address] [place], [place] [address]    Special Requests   Final    BOTTLES DRAWN AEROBIC ONLY Blood Culture adequate volume Performed at [facility] [address] [place], [place] [address]    Culture   Final    NO GROWTH 2 DAYS Performed at [facility] [facility] [address] [place],  [address]    Report Status PENDING  Incomplete     Radiology Studies: ECHOCARDIOGRAM COMPLETE  Result Date: 09/03/2020    ECHOCARDIOGRAM REPORT   Patient Name:   Caitlin Peters Date of Exam: 09/03/2020 Medical Rec #:  [id number]        Height:       68.0 in Accession #:    [id number]       Weight:       140.0 lb Date of Birth:  08-01-1961        BSA:          1.756 m Patient Age:    58 years         BP:           157/67 mmHg Patient Gender: F                HR:           64 bpm. Exam Location:   Inpatient Procedure: 2D Echo Indications:    bacteremia  History:        Patient has prior history of Echocardiogram examinations, most                 recent 05/10/2020. Risk Factors:Hypertension and Former Smoker.  Sonographer:    Jannett Celestine RDCS (AE) Referring Phys: [place]  1. Left ventricular ejection fraction, by estimation, is 60 to 65%. The left ventricle has normal function. The left ventricle has no regional wall motion abnormalities. There is mild concentric left ventricular hypertrophy.  Left ventricular diastolic parameters were normal.  2. Right ventricular systolic function is normal. The right ventricular size is normal. There is normal pulmonary artery systolic pressure.  3. Left atrial size was moderately dilated.  4. The mitral valve is normal in structure. No evidence of mitral valve regurgitation. No evidence of mitral stenosis.  5. The aortic valve is normal in structure. Aortic valve regurgitation is not visualized. No aortic stenosis is present.  6. The inferior vena cava is normal in size with greater than 50% respiratory variability, suggesting right atrial pressure of 3 mmHg. FINDINGS  Left Ventricle: Left ventricular ejection fraction, by estimation, is 60 to 65%. The left ventricle has normal function. The left ventricle has no regional wall motion abnormalities. The left ventricular internal cavity size was normal in size. There is  mild concentric left ventricular hypertrophy. Left ventricular diastolic parameters were normal. Normal left ventricular filling pressure. Right Ventricle: The right ventricular size is normal. No increase in right ventricular wall thickness. Right ventricular systolic function is normal. There is normal pulmonary artery systolic pressure. The tricuspid regurgitant velocity is 2.57 m/s, and  with an assumed right atrial pressure of 3 mmHg, the estimated right ventricular systolic pressure is 16.0 mmHg. Left Atrium: Left atrial size  was moderately dilated. Right Atrium: Right atrial size was normal in size. Pericardium: There is no evidence of pericardial effusion. Mitral Valve: The mitral valve is normal in structure. No evidence of mitral valve regurgitation. No evidence of mitral valve stenosis. Tricuspid Valve: The tricuspid valve is normal in structure. Tricuspid valve regurgitation is trivial. No evidence of tricuspid stenosis. Aortic Valve: The aortic valve is normal in structure. Aortic valve regurgitation is not visualized. No aortic stenosis is present. Pulmonic Valve: The pulmonic valve was normal in structure. Pulmonic valve regurgitation is not visualized. No evidence of pulmonic stenosis. Aorta: The aortic root is normal in size and structure. Venous: The inferior vena cava is normal in size with greater than 50% respiratory variability, suggesting right atrial pressure of 3 mmHg. IAS/Shunts: No atrial level shunt detected by color flow Doppler.  LEFT VENTRICLE PLAX 2D LVIDd:         4.50 cm  Diastology LVIDs:         2.65 cm  LV e' medial:    7.62 cm/s LV PW:         1.20 cm  LV E/e' medial:  12.5 LV IVS:        1.20 cm  LV e' lateral:   11.30 cm/s LVOT diam:     2.30 cm  LV E/e' lateral: 8.4 LV SV:         131 LV SV Index:   75 LVOT Area:     4.15 cm  RIGHT VENTRICLE RV S prime:     11.70 cm/s TAPSE (M-mode): 1.8 cm LEFT ATRIUM             Index       RIGHT ATRIUM           Index LA diam:        3.50 cm 1.99 cm/m  RA Area:     12.00 cm LA Vol (A2C):   76.7 ml 43.67 ml/m RA Volume:   20.70 ml  11.79 ml/m LA Vol (A4C):   50.5 ml 28.75 ml/m LA Biplane Vol: 61.5 ml 35.02 ml/m  AORTIC VALVE LVOT Vmax:   167.00 cm/s LVOT Vmean:  105.000 cm/s LVOT VTI:    0.315 m  AORTA Ao Root diam: 3.10 cm MITRAL VALVE               TRICUSPID VALVE MV Area (PHT): 2.76 cm    TR Peak grad:   26.4 mmHg MV Decel Time: 275 msec    TR Vmax:        257.00 cm/s MV E velocity: 95.10 cm/s                            SHUNTS                             Systemic VTI:  0.32 m                            Systemic Diam: 2.30 cm Skeet Latch MD Electronically signed by Skeet Latch MD Signature Date/Time: 09/03/2020/1:33:11 PM    Final     Scheduled Meds: . (feeding supplement) PROSource Plus  30 mL Oral BID BM  . amLODipine  10 mg Oral Daily  . budesonide  3 mg Oral Daily  . buPROPion  200 mg Oral Daily  . Chlorhexidine Gluconate Cloth  6 each Topical Daily  . cholecalciferol  1,000 Units Oral Daily  . cycloSPORINE  1 drop Both Eyes BID  . dicyclomine  20 mg Oral TID  . DULoxetine  90 mg Oral Daily  . estradiol  2 mg Oral Daily  . famotidine  20 mg Oral QHS  . feeding supplement ([employer] STANDARD 1.4)  325 mL Oral Daily  . hydrALAZINE  50 mg Oral TID  . insulin aspart  0-9 Units Subcutaneous 4 times per day  . metoprolol tartrate  100 mg Oral BID  . multivitamin  15 mL Oral Daily  . omega-3 acid ethyl esters  1,000 mg Oral Daily  . pantoprazole  40 mg Oral Daily  . polyvinyl alcohol  1 drop Both Eyes QID  . senna-docusate  1 tablet Oral BID  . sodium chloride flush  10-40 mL Intracatheter Q12H  . sucralfate  1 g Oral TID WC & HS  . vitamin B-12  1,000 mcg Oral Daily   Continuous Infusions: . dextrose 5 % and 0.9% NaCl 75 mL/hr at 09/02/20 1600  . [START ON 09/04/2020] dextrose 5 % and 0.9% NaCl    . potassium chloride 10 mEq (09/03/20 1430)  . sulfamethoxazole-trimethoprim 320 mg (09/03/20 0914)  . TPN CYCLIC-ADULT (ION)       LOS: 2 days   Marylu Lund, MD [facility] Pager On Amion  If 7PM-7AM, please contact night-coverage 09/03/2020, 3:19 PM

## 2020-09-03 NOTE — Progress Notes (Signed)
  Echocardiogram 2D Echocardiogram has been performed.  Caitlin Peters 09/03/2020, 10:18 AM

## 2020-09-03 NOTE — Progress Notes (Signed)
PHARMACY - TOTAL PARENTERAL NUTRITION CONSULT NOTE   Indication: on TPN prior to admission with hx Croh's disease with short gut syndrome  Patient Measurements: Height: 5' 8"  (172.7 cm) Weight: 63.5 kg (140 lb) IBW/kg (Calculated) : 63.9 TPN AdjBW (KG): 63.5 Body mass index is 21.29 kg/m.  Assessment: TPN was originally on hold due to possibility of PICC removal but ID has decided that will try and keep the line for now and treat bacteremia. As a result, will restart patient's TPN this PM.   Glucose / Insulin: CBG stable < 150 Electrolytes: K 3.5, mag 1.6 on 10/8 no replacement orders entered Renal: SCr 1.47 BUN 11 LFTs / TGs: AST/ALT 56/244 down from yesterday. TG 68 Prealbumin / albumin: 23.2/3.0 Intake / Output; MIVF: n/a GI Imaging:  Surgeries / Procedures: n/a  Central access: PICC line TPN start date: on PTA  Nutritional Goals (per RD recommendation on 10/9): kCal: 1905-2100, Protein: 95-110, Fluid: >= 2L/day  Per home TPN formula, 1800 mL over 14 hours to provide 75g protein and 1081 kcal with no lipid and when lipid added to TPN, provides 75g protein and 1481 kcal  Current Nutrition:  Regular diet + cyclic TPN  Plan:   2g Mag Sulfate IV x 1  66mq KCL IV x 4 runs  At 1800:  1800 mL of TPN over 14 hours to provide 75g protein and 1081 Kcal - will hold lipids for now due to elevated LFTs  Electrolytes in TPN: 566m/L of Na, 5034mL of K, 5mE13m of Ca, 5mEq75mof Mg, and 15mmo6mof Phos. Cl:Ac ratio 1:1  Add standard MVI and trace elements to TPN  Initiate Sensitive q8h SSI and adjust as needed   Run IVF's from 8am to 6pm daily, OFF while TPN running  Monitor TPN labs on Mon/Thurs  Johnte Portnoy,Kara Mead2021,10:52 AM

## 2020-09-04 DIAGNOSIS — R7881 Bacteremia: Secondary | ICD-10-CM | POA: Diagnosis not present

## 2020-09-04 LAB — COMPREHENSIVE METABOLIC PANEL
ALT: 185 U/L — ABNORMAL HIGH (ref 0–44)
AST: 37 U/L (ref 15–41)
Albumin: 2.9 g/dL — ABNORMAL LOW (ref 3.5–5.0)
Alkaline Phosphatase: 206 U/L — ABNORMAL HIGH (ref 38–126)
Anion gap: 8 (ref 5–15)
BUN: 22 mg/dL — ABNORMAL HIGH (ref 6–20)
CO2: 22 mmol/L (ref 22–32)
Calcium: 9.7 mg/dL (ref 8.9–10.3)
Chloride: 105 mmol/L (ref 98–111)
Creatinine, Ser: 1.53 mg/dL — ABNORMAL HIGH (ref 0.44–1.00)
GFR, Estimated: 37 mL/min — ABNORMAL LOW (ref >60)
Glucose, Bld: 100 mg/dL — ABNORMAL HIGH (ref 70–99)
Potassium: 4.4 mmol/L (ref 3.5–5.1)
Sodium: 135 mmol/L (ref 135–145)
Total Bilirubin: 0.7 mg/dL (ref 0.3–1.2)
Total Protein: 6.8 g/dL (ref 6.5–8.1)

## 2020-09-04 LAB — GLUCOSE, CAPILLARY
Glucose-Capillary: 106 mg/dL — ABNORMAL HIGH (ref 70–99)
Glucose-Capillary: 109 mg/dL — ABNORMAL HIGH (ref 70–99)
Glucose-Capillary: 87 mg/dL (ref 70–99)
Glucose-Capillary: 113 mg/dL — ABNORMAL HIGH (ref 70–99)

## 2020-09-04 LAB — MAGNESIUM: Magnesium: 2.1 mg/dL (ref 1.7–2.4)

## 2020-09-04 LAB — PHOSPHORUS: Phosphorus: 3.3 mg/dL (ref 2.5–4.6)

## 2020-09-04 MED ORDER — TRAVASOL 10 % IV SOLN
INTRAVENOUS | Status: AC
  Filled 2020-09-04: qty 756

## 2020-09-04 MED ORDER — SULFAMETHOXAZOLE-TRIMETHOPRIM 800-160 MG PO TABS
2.0000 | ORAL_TABLET | Freq: Three times a day (TID) | ORAL | Status: DC
  Administered 2020-09-04 – 2020-09-06 (×6): 2 via ORAL
  Filled 2020-09-04 (×7): qty 2

## 2020-09-04 MED ORDER — SULFAMETHOXAZOLE-TRIMETHOPRIM 800-160 MG PO TABS: 1.0000 | ORAL_TABLET | Freq: Two times a day (BID) | ORAL | Status: DC

## 2020-09-04 NOTE — Plan of Care (Signed)

## 2020-09-04 NOTE — Progress Notes (Signed)
PHARMACY - TOTAL PARENTERAL NUTRITION CONSULT NOTE   Indication: on TPN prior to admission with hx Croh's disease with short gut syndrome  Patient Measurements: Height: 5' 8"  (172.7 cm) Weight: 63.5 kg (140 lb) IBW/kg (Calculated) : 63.9 TPN AdjBW (KG): 63.5 Body mass index is 21.29 kg/m.  Assessment: TPN was originally on hold due to possibility of PICC removal but ID has decided that will try and keep the line for now and treat bacteremia. As a result, will restart patient's TPN this PM. Per Dr. Linus Salmons on 10/10, ok to resume TPN thru current PICC line  Glucose / Insulin: CBG stable < 150 Electrolytes: WNL Renal: SCr 1.47 BUN 11 LFTs / TGs: AST/ALT 37/185 down from yesterday. TG 68 Prealbumin / albumin: 23.2/2.9 Intake / Output; MIVF: n/a GI Imaging:  Surgeries / Procedures: n/a  Central access: PICC line TPN start date: on PTA  Nutritional Goals (per RD recommendation on 10/9): kCal: 1905-2100, Protein: 95-110, Fluid: >= 2L/day  Per home TPN formula, 1800 mL over 14 hours to provide 75g protein and 1081 kcal with no lipid and when lipid added to TPN, provides 75g protein and 1481 kcal  Current Nutrition:  Regular diet + cyclic TPN - reported that patient with fair appetite - dietitian also started feeding supplements  Plan:  1800 mL of TPN over 14 hours to provide 75g protein and 1081 Kcal - will continue hold lipids for now due to elevated LFTs that are improving but patient also eating regular diet and has [employer] [place] suppl once daily and prosource BID  Electrolytes in TPN: 37mq/L of Na, 512m/L of K, 50m4mL of Ca, 50mE29m of Mg, and 150mm61m of Phos. Cl:Ac ratio 1:1  MVI being given PO and will add trace elements to TPN  Initiate Sensitive q8h SSI and adjust as needed   Run IVF's from 8am to 6pm daily, OFF while TPN running  Monitor TPN labs on Mon/Thurs  LeggeKara Mead0/2021,12:00 PM

## 2020-09-04 NOTE — Plan of Care (Signed)
  Problem: Education: Goal: Knowledge of General Education information will improve Description Including pain rating scale, medication(s)/side effects and non-pharmacologic comfort measures Outcome: Progressing   Problem: Clinical Measurements: Goal: Ability to maintain clinical measurements within normal limits will improve Outcome: Progressing   Problem: Clinical Measurements: Goal: Will remain free from infection Outcome: Progressing   

## 2020-09-04 NOTE — Progress Notes (Signed)
PROGRESS NOTE    Caitlin Peters  YIR:[id number] DOB: 08/26/1961 DOA: 08/31/2020 PCP: Erline Hau, MD    Brief Narrative:  59 y.o. female with history of Crohn's disease, chronic kidney disease stage III, hypertension, chronic anemia thrombocytopenia followed by gastroenterologist at [facility] recently admitted for sepsis discharged home on August 12, 2020 about 20 days ago had come to the ER a day ago for fever at home and as per the patient patient recorded fever 102 F with some chills.  Patient has chronic abdominal pain and loose stools.  Denies any chest pain shortness of breath productive cough or vomiting.  In the ER patient had a CT abdomen pelvis which did not show anything acute LFTs are markedly elevated at that time.  Blood cultures obtained and patient was discharged home.  Blood cultures grew  Enterobacter cloacae complex,Enterobacterales,Stenotrophomonas maltophilia.  Patient was advised to come back to the ER.  Patient is on TPN and has a right femoral PICC line which could be the source.  Assessment & Plan:   Principal Problem:   Sepsis ([place]) Active Problems:   Crohn's colitis ([place])   Crohn disease ([place])   On total parenteral nutrition (TPN)   Abnormal LFTs   Stage 3b chronic kidney disease ([place])   Bacteremia  1. Bacteremia with sepsis present on admit 1. Presenting fever, tachycardia, leukopenia 2. Blood cx on 10/5 thus far pos for Stenotrophomonas maltophilia 3. Chart reviewed. 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 Pt with R femoral picc placed last month. Pt known to have very difficult access with limited sites 5. ID consulted regarding recurrent bacteremia. Recommendation to keep PICC in place 6. 2d echo ordered and results reviewed. Finding of normal LVEF. No mention of vegetation 7. Per ID, will need repeat peripheral blood culture at the end of treatment course 8. Currently continued on IV bactrim. Will  transition to PO bactrim today 9. Plan for bactrim x 14 days per ID recs 2. Abnormal LFTs are improving 1. Tolerating diet, denies RUQ pain 2. Repeat lft in AM 3. Acute on chronic kidney disease stage III 1. Cr peak at 1.47. Pt voiding well 2. On IVF hydration 3. Renal function seems to be near baseline 4. Recheck bmet in AM 4. Chronic anemia and thrombocytopenia 1. Suspect secondary to presenting sepsis. 2. Plts up to 138 on most recent check 3. Repeat CBC in AM 5. Hypertension 1.  continue amlodipine beta-blockers and hydralazine 2. Stable and controlled currently 6. 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   1. Being followed at [facility]. 2. TPN was initially on hold 3. Now on regular diet but pt not tolerating secondary to abd discomfort 4. Have discussed with ID today. OK to continue TPN 7. Chronic pain 1.  on methadone and presently on Dilaudid.  DVT prophylaxis: SCD's Code Status: Full Family Communication: Pt in room, family not at bedside  Status is: Inpatient  Remains inpatient appropriate because:Unsafe d/c plan, IV treatments appropriate due to intensity of illness or inability to take PO and Inpatient level of care appropriate due to severity of illness   Dispo: The patient is from: Home              Anticipated d/c is to: Home              Anticipated d/c date is: > 3 days  Patient currently is not medically stable to d/c.  Consultants:   ID  IR  Procedures:     Antimicrobials: Anti-infectives (From admission, onward)   Start     Dose/Rate Route Frequency Ordered Stop   09/04/20 1400  sulfamethoxazole-trimethoprim (BACTRIM DS) 800-160 MG per tablet 2 tablet        2 tablet Oral Every 8 hours 09/04/20 1006     09/04/20 1000  sulfamethoxazole-trimethoprim (BACTRIM DS) 800-160 MG per tablet 1 tablet  Status:  Discontinued        1 tablet Oral Every 12 hours 09/04/20 0945 09/04/20 1004   09/01/20 1600   sulfamethoxazole-trimethoprim (BACTRIM) 320 mg in dextrose 5 % 500 mL IVPB  Status:  Discontinued        320 mg 346.7 mL/hr over 90 Minutes Intravenous Every 8 hours 09/01/20 0819 09/04/20 0945   09/01/20 0000  sulfamethoxazole-trimethoprim (BACTRIM) 317.44 mg in dextrose 5 % 500 mL IVPB  Status:  Discontinued        15 mg/kg/day  63.5 kg 346.6 mL/hr over 90 Minutes Intravenous Every 8 hours 08/31/20 2356 09/01/20 0819      Subjective: Reports abd discomfort while trying to eat regular diet.   Objective: Vitals:   09/03/20 1421 09/03/20 2052 09/04/20 0706 09/04/20 1300  BP: 135/61 140/73 (!) 154/83 138/62  Pulse: (!) 57 64 70 60  Resp: 17 16 17 18   Temp: 98.4 F (36.9 C) 98.2 F (36.8 C) 98.8 F (37.1 C) 98.4 F (36.9 C)  TempSrc: Oral Oral Oral Oral  SpO2: 99% 99% 99% 100%  Weight:      Height:        Intake/Output Summary (Last 24 hours) at 09/04/2020 1548 Last data filed at 09/04/2020 1400 Gross per 24 hour  Intake 3558.17 ml  Output --  Net 3558.17 ml   Filed Weights   09/01/20 0510  Weight: 63.5 kg    Examination: General exam: Awake, laying in bed, in nad Respiratory system: Normal respiratory effort, no wheezing Cardiovascular system: regular rate, s1, s2 Gastrointestinal system: Soft, nondistended, positive BS Central nervous system: CN2-12 grossly intact, strength intact Extremities: Perfused, no clubbing Skin: Normal skin turgor, no notable skin lesions seen Psychiatry: Mood normal // no visual hallucinations   Data Reviewed: I have personally reviewed following labs and imaging studies  CBC: Recent Labs  Lab 08/30/20 2249 08/31/20 2335 09/01/20 0558 09/02/20 0324 09/03/20 0350  WBC 1.7* 6.3 4.7 3.6* 2.7*  NEUTROABS 1.5* 4.8  --  2.3  --   HGB 10.2* 8.0* 7.8* 7.4* 8.0*  HCT 30.7* 24.3* 25.0* 22.9* 24.3*  MCV 91.4 93.5 96.9 92.0 90.0  PLT 125* 119* 94* 125* 599*   Basic Metabolic Panel: Recent Labs  Lab 08/31/20 2335 09/01/20 0558  09/02/20 0324 09/03/20 0350 09/04/20 0416  NA 138 136 135 139 135  K 3.8 3.6 3.8 3.5 4.4  CL 105 103 105 107 105  CO2 25 22 24 23 22   GLUCOSE 87 97 87 94 100*  BUN 35* 27* 16 11 22*  CREATININE 1.76* 1.58* 1.54* 1.47* 1.53*  CALCIUM 9.3 9.3 8.8* 9.5 9.7  MG  --  1.7 1.6*  --  2.1  PHOS  --  2.9 2.6  --  3.3   GFR: Estimated Creatinine Clearance: 39.7 mL/min (A) (by C-G formula based on SCr of 1.53 mg/dL (H)). Liver Function Tests: Recent Labs  Lab 08/31/20 2335 09/01/20 0558 09/02/20 0324 09/03/20 0350 09/04/20 0416  AST 326* 259* 113*  56* 37  ALT 544* 458* 316* 244* 185*  ALKPHOS 232* 211* 190* 203* 206*  BILITOT 1.0 1.0 0.7 0.8 0.7  PROT 6.9 6.5 6.0* 6.6 6.8  ALBUMIN 3.0* 2.8* 2.8* 3.0* 2.9*   No results for input(s): LIPASE, AMYLASE in the last 168 hours. No results for input(s): AMMONIA in the last 168 hours. Coagulation Profile: Recent Labs  Lab 08/31/20 2335  INR 1.0   Cardiac Enzymes: No results for input(s): CKTOTAL, CKMB, CKMBINDEX, TROPONINI in the last 168 hours. BNP (last 3 results) No results for input(s): PROBNP in the last 8760 hours. HbA1C: No results for input(s): HGBA1C in the last 72 hours. CBG: Recent Labs  Lab 09/03/20 1808 09/03/20 2127 09/04/20 0209 09/04/20 0742 09/04/20 1411  GLUCAP 83 110* 106* 109* 87   Lipid Profile: Recent Labs    09/02/20 0324  TRIG 68   Thyroid Function Tests: No results for input(s): TSH, T4TOTAL, FREET4, T3FREE, THYROIDAB in the last 72 hours. Anemia Panel: No results for input(s): VITAMINB12, FOLATE, FERRITIN, TIBC, IRON, RETICCTPCT in the last 72 hours. Sepsis Labs: Recent Labs  Lab 08/30/20 2315 08/31/20 2335  LATICACIDVEN 1.2 0.5    Recent Results (from the past 240 hour(s))  Blood culture (routine x 2)     Status: Abnormal   Collection Time: 08/30/20 10:50 PM   Specimen: BLOOD  Result Value Ref Range Status   Specimen Description   Final    BLOOD CENTRAL LINE Performed at [facility], [place] [address] [place], [place] [address]    Special Requests   Final    BOTTLES DRAWN AEROBIC AND ANAEROBIC Blood Culture adequate volume Performed at [place] [address], [place], [place] [address]    Culture  Setup Time   Final    GRAM NEGATIVE RODS AEROBIC BOTTLE ONLY CRITICAL RESULT CALLED TO, READ BACK BY AND VERIFIED WITH: L ADKINS RN 08/31/20 2114 JDW Performed at [facility], [place] [address], [place], [place] [address]    Culture STENOTROPHOMONAS MALTOPHILIA (A)  Final   Report Status 09/02/2020 FINAL  Final   Organism ID, Bacteria STENOTROPHOMONAS MALTOPHILIA  Final      Susceptibility   Stenotrophomonas maltophilia - MIC*    LEVOFLOXACIN 0.5 SENSITIVE Sensitive     TRIMETH/SULFA <=20 SENSITIVE Sensitive     * STENOTROPHOMONAS MALTOPHILIA  Blood Culture ID Panel (Reflexed)     Status: Abnormal   Collection Time: 08/30/20 10:50 PM  Result Value Ref Range Status   Enterococcus faecalis NOT DETECTED NOT DETECTED Final   Enterococcus Faecium NOT DETECTED NOT DETECTED Final   Listeria monocytogenes NOT DETECTED NOT DETECTED Final   Staphylococcus species NOT DETECTED NOT DETECTED Final   Staphylococcus aureus (BCID) NOT DETECTED NOT DETECTED Final   Staphylococcus epidermidis NOT DETECTED NOT DETECTED Final   Staphylococcus lugdunensis NOT DETECTED NOT DETECTED Final   Streptococcus species NOT DETECTED NOT DETECTED Final   Streptococcus agalactiae NOT DETECTED NOT DETECTED Final   Streptococcus pneumoniae NOT DETECTED NOT DETECTED Final   Streptococcus pyogenes NOT DETECTED NOT DETECTED Final   A.calcoaceticus-baumannii NOT DETECTED NOT DETECTED Final   Bacteroides fragilis NOT DETECTED NOT DETECTED Final   Enterobacterales NOT DETECTED NOT DETECTED Final   Enterobacter cloacae complex NOT DETECTED NOT DETECTED Final   Escherichia coli NOT DETECTED NOT DETECTED Final   Klebsiella aerogenes NOT DETECTED NOT  DETECTED Final   Klebsiella oxytoca NOT DETECTED NOT DETECTED Final   Klebsiella pneumoniae NOT DETECTED  NOT DETECTED Final   Proteus species NOT DETECTED NOT DETECTED Final   Salmonella species NOT DETECTED NOT DETECTED Final   Serratia marcescens NOT DETECTED NOT DETECTED Final   Haemophilus influenzae NOT DETECTED NOT DETECTED Final   Neisseria meningitidis NOT DETECTED NOT DETECTED Final   Pseudomonas aeruginosa NOT DETECTED NOT DETECTED Final   Stenotrophomonas maltophilia DETECTED (A) NOT DETECTED Final    Comment: CRITICAL RESULT CALLED TO, READ BACK BY AND VERIFIED WITH: L ADKINS RN 08/31/20 2114 JDW    Candida albicans NOT DETECTED NOT DETECTED Final   Candida auris NOT DETECTED NOT DETECTED Final   Candida glabrata NOT DETECTED NOT DETECTED Final   Candida krusei NOT DETECTED NOT DETECTED Final   Candida parapsilosis NOT DETECTED NOT DETECTED Final   Candida tropicalis NOT DETECTED NOT DETECTED Final   Cryptococcus neoformans/gattii NOT DETECTED NOT DETECTED Final    Comment: Performed at [facility], [place] [address], [place], [place] [address]  Respiratory Panel by RT PCR (Flu A&B, Covid) - Nasopharyngeal Swab     Status: None   Collection Time: 08/31/20 12:03 AM   Specimen: Nasopharyngeal Swab  Result Value Ref Range Status   SARS Coronavirus 2 by RT PCR NEGATIVE NEGATIVE Final    Comment: (NOTE) SARS-CoV-2 target nucleic acids are NOT DETECTED.  The SARS-CoV-2 RNA is generally detectable in upper respiratoy specimens during the acute phase of infection. The lowest concentration of SARS-CoV-2 viral copies this assay can detect is 131 copies/mL. A negative result does not preclude SARS-Cov-2 infection and should not be used as the sole basis for treatment or other patient management decisions. A negative result may occur with  improper specimen collection/handling, submission of specimen other than nasopharyngeal swab, presence of viral mutation(s) within  the areas targeted by this assay, and inadequate number of viral copies (<131 copies/mL). A negative result must be combined with clinical observations, patient history, and epidemiological information. The expected result is Negative.  Fact Sheet for Patients:  PinkCheek.be  Fact Sheet for Healthcare Providers:  GravelBags.it  This test is no t yet approved or cleared by the [place] FDA and  has been authorized for detection and/or diagnosis of SARS-CoV-2 by FDA under an Emergency Use Authorization (EUA). This EUA will remain  in effect (meaning this test can be used) for the duration of the COVID-19 declaration under Section 564(b)(1) of the Act, 21 U.S.C. section 360bbb-3(b)(1), unless the authorization is terminated or revoked sooner.     Influenza A by PCR NEGATIVE NEGATIVE Final   Influenza B by PCR NEGATIVE NEGATIVE Final    Comment: (NOTE) The Xpert Xpress SARS-CoV-2/FLU/RSV assay is intended as an aid in  the diagnosis of influenza from Nasopharyngeal swab specimens and  should not be used as a sole basis for treatment. Nasal washings and  aspirates are unacceptable for Xpert Xpress SARS-CoV-2/FLU/RSV  testing.  Fact Sheet for Patients: PinkCheek.be  Fact Sheet for Healthcare Providers: GravelBags.it  This test is not yet approved or cleared by the [place] FDA and  has been authorized for detection and/or diagnosis of SARS-CoV-2 by  FDA under an Emergency Use Authorization (EUA). This EUA will remain  in effect (meaning this test can be used) for the duration of the  Covid-19 declaration under Section 564(b)(1) of the Act, 21  U.S.C. section 360bbb-3(b)(1), unless the authorization is  terminated or revoked. Performed at [facility], [place] [address] [place], [place] [address]   Urine culture  Status: Abnormal    Collection Time: 08/31/20  3:30 AM   Specimen: In/Out Cath Urine  Result Value Ref Range Status   Specimen Description   Final    IN/OUT CATH URINE Performed at [facility], [place] [address], [place], [place] [address]    Special Requests   Final    NONE Performed at [facility], [place] [address], [place], [place] [address]    Culture (A)  Final    100 COLONIES/mL STAPHYLOCOCCUS EPIDERMIDIS 100 COLONIES/mL STAPHYLOCOCCUS WARNERI    Report Status 09/02/2020 FINAL  Final   Organism ID, Bacteria STAPHYLOCOCCUS EPIDERMIDIS (A)  Final   Organism ID, Bacteria STAPHYLOCOCCUS WARNERI (A)  Final      Susceptibility   Staphylococcus epidermidis - MIC*    CIPROFLOXACIN >=8 RESISTANT Resistant     GENTAMICIN 8 INTERMEDIATE Intermediate     NITROFURANTOIN <=16 SENSITIVE Sensitive     OXACILLIN >=4 RESISTANT Resistant     TETRACYCLINE <=1 SENSITIVE Sensitive     VANCOMYCIN 2 SENSITIVE Sensitive     TRIMETH/SULFA 80 RESISTANT Resistant     CLINDAMYCIN >=8 RESISTANT Resistant     RIFAMPIN <=0.5 SENSITIVE Sensitive     Inducible Clindamycin NEGATIVE Sensitive     * 100 COLONIES/mL STAPHYLOCOCCUS EPIDERMIDIS   Staphylococcus warneri - MIC*    CIPROFLOXACIN <=0.5 SENSITIVE Sensitive     GENTAMICIN <=0.5 SENSITIVE Sensitive     NITROFURANTOIN 32 SENSITIVE Sensitive     OXACILLIN <=0.25 SENSITIVE Sensitive     TETRACYCLINE <=1 SENSITIVE Sensitive     VANCOMYCIN 1 SENSITIVE Sensitive     TRIMETH/SULFA <=10 SENSITIVE Sensitive     CLINDAMYCIN >=8 RESISTANT Resistant     RIFAMPIN <=0.5 SENSITIVE Sensitive     Inducible Clindamycin NEGATIVE Sensitive     * 100 COLONIES/mL STAPHYLOCOCCUS WARNERI  Blood Culture (routine x 2)     Status: Abnormal   Collection Time: 08/31/20 11:35 PM   Specimen: BLOOD  Result Value Ref Range Status   Specimen Description   Final    BLOOD PICC LINE Performed at [facility], [place] [address], [place],  [place] [address]    Special Requests   Final    BOTTLES DRAWN AEROBIC AND ANAEROBIC Blood Culture adequate volume Performed at [place] [address] [place], [place] [address]    Culture  Setup Time   Final    GRAM NEGATIVE RODS AEROBIC BOTTLE ONLY CRITICAL VALUE NOTED.  VALUE IS CONSISTENT WITH PREVIOUSLY REPORTED AND CALLED VALUE.    Culture (A)  Final    STENOTROPHOMONAS MALTOPHILIA SUSCEPTIBILITIES PERFORMED ON PREVIOUS CULTURE WITHIN THE LAST 5 DAYS. Performed at [facility], [place] [address], [place],  [address]    Report Status 09/02/2020 FINAL  Final  Respiratory Panel by RT PCR (Flu A&B, Covid) - Nasopharyngeal Swab     Status: None   Collection Time: 09/01/20  3:34 AM   Specimen: Nasopharyngeal Swab  Result Value Ref Range Status   SARS Coronavirus 2 by RT PCR NEGATIVE NEGATIVE Final    Comment: (NOTE) SARS-CoV-2 target nucleic acids are NOT DETECTED.  The SARS-CoV-2 RNA is generally detectable in upper respiratoy specimens during the acute phase of infection. The lowest concentration of SARS-CoV-2 viral copies this assay can detect is 131 copies/mL. A negative result does not preclude SARS-Cov-2 infection and should not be used as the sole basis for treatment or other patient management decisions. A negative result may occur  with  improper specimen collection/handling, submission of specimen other than nasopharyngeal swab, presence of viral mutation(s) within the areas targeted by this assay, and inadequate number of viral copies (<131 copies/mL). A negative result must be combined with clinical observations, patient history, and epidemiological information. The expected result is Negative.  Fact Sheet for Patients:  PinkCheek.be  Fact Sheet for Healthcare Providers:  GravelBags.it  This test is no t yet approved or cleared by the [place] FDA and  has been authorized  for detection and/or diagnosis of SARS-CoV-2 by FDA under an Emergency Use Authorization (EUA). This EUA will remain  in effect (meaning this test can be used) for the duration of the COVID-19 declaration under Section 564(b)(1) of the Act, 21 U.S.C. section 360bbb-3(b)(1), unless the authorization is terminated or revoked sooner.     Influenza A by PCR NEGATIVE NEGATIVE Final   Influenza B by PCR NEGATIVE NEGATIVE Final    Comment: (NOTE) The Xpert Xpress SARS-CoV-2/FLU/RSV assay is intended as an aid in  the diagnosis of influenza from Nasopharyngeal swab specimens and  should not be used as a sole basis for treatment. Nasal washings and  aspirates are unacceptable for Xpert Xpress SARS-CoV-2/FLU/RSV  testing.  Fact Sheet for Patients: PinkCheek.be  Fact Sheet for Healthcare Providers: GravelBags.it  This test is not yet approved or cleared by the [place] FDA and  has been authorized for detection and/or diagnosis of SARS-CoV-2 by  FDA under an Emergency Use Authorization (EUA). This EUA will remain  in effect (meaning this test can be used) for the duration of the  Covid-19 declaration under Section 564(b)(1) of the Act, 21  U.S.C. section 360bbb-3(b)(1), unless the authorization is  terminated or revoked. Performed at [facility], [place] [address], [place], [place] [address]   Blood Culture (routine x 2)     Status: None (Preliminary result)   Collection Time: 09/01/20  5:58 AM   Specimen: BLOOD LEFT HAND  Result Value Ref Range Status   Specimen Description   Final    BLOOD LEFT HAND Performed at [place] [address], [place], [place] [address]    Special Requests   Final    BOTTLES DRAWN AEROBIC ONLY Blood Culture adequate volume Performed at [place] [address] [place], [place] [address]    Culture   Final    NO GROWTH 3  DAYS Performed at [facility], [place] [address], [place], [place] [address]    Report Status PENDING  Incomplete     Radiology Studies: ECHOCARDIOGRAM COMPLETE  Result Date: 09/03/2020    ECHOCARDIOGRAM REPORT   Patient Name:   Caitlin Peters Date of Exam: 09/03/2020 Medical Rec #:  [id number]        Height:       68.0 in Accession #:    [id number]       Weight:       140.0 lb Date of Birth:  [birth year]        BSA:          1.756 m Patient Age:    74 years         BP:           157/67 mmHg Patient Gender: F                HR:           64 bpm. Exam Location:  Inpatient Procedure: 2D Echo Indications:  bacteremia  History:        Patient has prior history of Echocardiogram examinations, most                 recent 05/10/2020. Risk Factors:Hypertension and Former Smoker.  Sonographer:    Jannett Celestine RDCS (AE) Referring Phys: [place]  1. Left ventricular ejection fraction, by estimation, is 60 to 65%. The left ventricle has normal function. The left ventricle has no regional wall motion abnormalities. There is mild concentric left ventricular hypertrophy. Left ventricular diastolic parameters were normal.  2. Right ventricular systolic function is normal. The right ventricular size is normal. There is normal pulmonary artery systolic pressure.  3. Left atrial size was moderately dilated.  4. The mitral valve is normal in structure. No evidence of mitral valve regurgitation. No evidence of mitral stenosis.  5. The aortic valve is normal in structure. Aortic valve regurgitation is not visualized. No aortic stenosis is present.  6. The inferior vena cava is normal in size with greater than 50% respiratory variability, suggesting right atrial pressure of 3 mmHg. FINDINGS  Left Ventricle: Left ventricular ejection fraction, by estimation, is 60 to 65%. The left ventricle has normal function. The left ventricle has no regional wall motion abnormalities. The left ventricular internal  cavity size was normal in size. There is  mild concentric left ventricular hypertrophy. Left ventricular diastolic parameters were normal. Normal left ventricular filling pressure. Right Ventricle: The right ventricular size is normal. No increase in right ventricular wall thickness. Right ventricular systolic function is normal. There is normal pulmonary artery systolic pressure. The tricuspid regurgitant velocity is 2.57 m/s, and  with an assumed right atrial pressure of 3 mmHg, the estimated right ventricular systolic pressure is 92.1 mmHg. Left Atrium: Left atrial size was moderately dilated. Right Atrium: Right atrial size was normal in size. Pericardium: There is no evidence of pericardial effusion. Mitral Valve: The mitral valve is normal in structure. No evidence of mitral valve regurgitation. No evidence of mitral valve stenosis. Tricuspid Valve: The tricuspid valve is normal in structure. Tricuspid valve regurgitation is trivial. No evidence of tricuspid stenosis. Aortic Valve: The aortic valve is normal in structure. Aortic valve regurgitation is not visualized. No aortic stenosis is present. Pulmonic Valve: The pulmonic valve was normal in structure. Pulmonic valve regurgitation is not visualized. No evidence of pulmonic stenosis. Aorta: The aortic root is normal in size and structure. Venous: The inferior vena cava is normal in size with greater than 50% respiratory variability, suggesting right atrial pressure of 3 mmHg. IAS/Shunts: No atrial level shunt detected by color flow Doppler.  LEFT VENTRICLE PLAX 2D LVIDd:         4.50 cm  Diastology LVIDs:         2.65 cm  LV e' medial:    7.62 cm/s LV PW:         1.20 cm  LV E/e' medial:  12.5 LV IVS:        1.20 cm  LV e' lateral:   11.30 cm/s LVOT diam:     2.30 cm  LV E/e' lateral: 8.4 LV SV:         131 LV SV Index:   75 LVOT Area:     4.15 cm  RIGHT VENTRICLE RV S prime:     11.70 cm/s TAPSE (M-mode): 1.8 cm LEFT ATRIUM             Index  RIGHT  ATRIUM           Index LA diam:        3.50 cm 1.99 cm/m  RA Area:     12.00 cm LA Vol (A2C):   76.7 ml 43.67 ml/m RA Volume:   20.70 ml  11.79 ml/m LA Vol (A4C):   50.5 ml 28.75 ml/m LA Biplane Vol: 61.5 ml 35.02 ml/m  AORTIC VALVE LVOT Vmax:   167.00 cm/s LVOT Vmean:  105.000 cm/s LVOT VTI:    0.315 m  AORTA Ao Root diam: 3.10 cm MITRAL VALVE               TRICUSPID VALVE MV Area (PHT): 2.76 cm    TR Peak grad:   26.4 mmHg MV Decel Time: 275 msec    TR Vmax:        257.00 cm/s MV E velocity: 95.10 cm/s                            SHUNTS                            Systemic VTI:  0.32 m                            Systemic Diam: 2.30 cm Skeet Latch MD Electronically signed by Skeet Latch MD Signature Date/Time: 09/03/2020/1:33:11 PM    Final     Scheduled Meds: . (feeding supplement) PROSource Plus  30 mL Oral BID BM  . amLODipine  10 mg Oral Daily  . budesonide  3 mg Oral Daily  . buPROPion  200 mg Oral Daily  . Chlorhexidine Gluconate Cloth  6 each Topical Daily  . cholecalciferol  1,000 Units Oral Daily  . cycloSPORINE  1 drop Both Eyes BID  . dicyclomine  20 mg Oral TID  . DULoxetine  90 mg Oral Daily  . estradiol  2 mg Oral Daily  . famotidine  20 mg Oral QHS  . feeding supplement ([employer] STANDARD 1.4)  325 mL Oral Daily  . hydrALAZINE  50 mg Oral TID  . insulin aspart  0-9 Units Subcutaneous 4 times per day  . metoprolol tartrate  100 mg Oral BID  . multivitamin  15 mL Oral Daily  . omega-3 acid ethyl esters  1,000 mg Oral Daily  . pantoprazole  40 mg Oral Daily  . polyvinyl alcohol  1 drop Both Eyes QID  . senna-docusate  1 tablet Oral BID  . sodium chloride flush  10-40 mL Intracatheter Q12H  . sucralfate  1 g Oral TID WC & HS  . sulfamethoxazole-trimethoprim  2 tablet Oral Q8H  . vitamin B-12  1,000 mcg Oral Daily   Continuous Infusions: . TPN CYCLIC-ADULT (ION)       LOS: 3 days   Marylu Lund, MD [facility] Pager On Amion  If 7PM-7AM, please  contact night-coverage 09/04/2020, 3:48 PM

## 2020-09-04 NOTE — Progress Notes (Signed)
Pharmacy Antibiotic Note  Caitlin Peters is a 59 y.o. female admitted on 08/31/2020 with history of Crohn's disease, CKD stage III, AVN, short gut syndrome, and HTN.  Advised to return to hospital for positive blood cultures.  Pharmacy has been consulted for bactrim dosing for recurrent stenotrophomonas line-associated bacteremia.  SCr elevated/stable at 1.5 with CrCl ~ 39 ml/min. K 4.4  Plan: Change to PO therapy today. Bactrim 2 DS tabs PO q8h Follow potassium, renal function, cultures and clinical course  Height: 5' 8"  (172.7 cm) Weight: 63.5 kg (140 lb) IBW/kg (Calculated) : 63.9  Temp (24hrs), Avg:98.5 F (36.9 C), Min:98.2 F (36.8 C), Max:98.8 F (37.1 C)  Recent Labs  Lab 08/30/20 2249 08/30/20 2249 08/30/20 2315 08/31/20 2335 09/01/20 0558 09/02/20 0324 09/03/20 0350 09/04/20 0416  WBC 1.7*  --   --  6.3 4.7 3.6* 2.7*  --   CREATININE 1.42*   < >  --  1.76* 1.58* 1.54* 1.47* 1.53*  LATICACIDVEN  --   --  1.2 0.5  --   --   --   --    < > = values in this interval not displayed.    Estimated Creatinine Clearance: 39.7 mL/min (A) (by C-G formula based on SCr of 1.53 mg/dL (H)).    Allergies  Allergen Reactions  . Cefepime Other (See Comments)    Neurotoxicity occurring in setting of AKI. Ceftriaxone tolerated during same admit  . Gabapentin Other (See Comments)    unknown  . Hyoscyamine Hives and Swelling    Legs swelling   Disorientation  . Lyrica [Pregabalin] Other (See Comments)    unknown  . Meperidine Hives    Other reaction(s): GI Upset Due to Chrones   . Topamax [Topiramate] Other (See Comments)    unknown  . Zosyn [Piperacillin Sod-Tazobactam So]     Patient reports it makes her vomit, her neck stiff, and her "heart feel funny"  . Fentanyl Rash    Pt is allergic to fentanyl patch related to the glue (gives her a rash) Pt states she is NOT allergic to fentanyl IV medicine  . Morphine And Related Rash     Thank you for allowing pharmacy  to be a part of this patient's care.  Gretta Arab PharmD, BCPS Clinical Pharmacist [facility] main pharmacy [phone] 09/04/2020 10:03 AM

## 2020-09-05 DIAGNOSIS — R7881 Bacteremia: Secondary | ICD-10-CM | POA: Diagnosis not present

## 2020-09-05 DIAGNOSIS — K5010 Crohn's disease of large intestine without complications: Secondary | ICD-10-CM | POA: Diagnosis not present

## 2020-09-05 LAB — DIFFERENTIAL
Abs Immature Granulocytes: 0.01 10*3/uL (ref 0.00–0.07)
Basophils Absolute: 0 10*3/uL (ref 0.0–0.1)
Basophils Relative: 0 %
Eosinophils Absolute: 0 10*3/uL (ref 0.0–0.5)
Eosinophils Relative: 1 %
Immature Granulocytes: 0 %
Lymphocytes Relative: 43 %
Lymphs Abs: 1.5 10*3/uL (ref 0.7–4.0)
Monocytes Absolute: 0.3 10*3/uL (ref 0.1–1.0)
Monocytes Relative: 8 %
Neutro Abs: 1.7 10*3/uL (ref 1.7–7.7)
Neutrophils Relative %: 48 %

## 2020-09-05 LAB — CBC
HCT: 26.4 % — ABNORMAL LOW (ref 36.0–46.0)
Hemoglobin: 8.8 g/dL — ABNORMAL LOW (ref 12.0–15.0)
MCH: 30.2 pg (ref 26.0–34.0)
MCHC: 33.3 g/dL (ref 30.0–36.0)
MCV: 90.7 fL (ref 80.0–100.0)
Platelets: 157 10*3/uL (ref 150–400)
RBC: 2.91 MIL/uL — ABNORMAL LOW (ref 3.87–5.11)
RDW: 14.1 % (ref 11.5–15.5)
WBC: 3.5 10*3/uL — ABNORMAL LOW (ref 4.0–10.5)
nRBC: 0 % (ref 0.0–0.2)

## 2020-09-05 LAB — COMPREHENSIVE METABOLIC PANEL
ALT: 134 U/L — ABNORMAL HIGH (ref 0–44)
AST: 24 U/L (ref 15–41)
Albumin: 3 g/dL — ABNORMAL LOW (ref 3.5–5.0)
Alkaline Phosphatase: 193 U/L — ABNORMAL HIGH (ref 38–126)
Anion gap: 8 (ref 5–15)
BUN: 29 mg/dL — ABNORMAL HIGH (ref 6–20)
CO2: 21 mmol/L — ABNORMAL LOW (ref 22–32)
Calcium: 9.6 mg/dL (ref 8.9–10.3)
Chloride: 105 mmol/L (ref 98–111)
Creatinine, Ser: 1.65 mg/dL — ABNORMAL HIGH (ref 0.44–1.00)
GFR, Estimated: 34 mL/min — ABNORMAL LOW (ref >60)
Glucose, Bld: 101 mg/dL — ABNORMAL HIGH (ref 70–99)
Potassium: 4.6 mmol/L (ref 3.5–5.1)
Sodium: 134 mmol/L — ABNORMAL LOW (ref 135–145)
Total Bilirubin: 0.5 mg/dL (ref 0.3–1.2)
Total Protein: 6.8 g/dL (ref 6.5–8.1)

## 2020-09-05 LAB — GLUCOSE, CAPILLARY
Glucose-Capillary: 110 mg/dL — ABNORMAL HIGH (ref 70–99)
Glucose-Capillary: 108 mg/dL — ABNORMAL HIGH (ref 70–99)

## 2020-09-05 LAB — TRIGLYCERIDES: Triglycerides: 54 mg/dL (ref <150)

## 2020-09-05 LAB — PREALBUMIN: Prealbumin: 30.5 mg/dL (ref 18–38)

## 2020-09-05 LAB — MAGNESIUM: Magnesium: 2 mg/dL (ref 1.7–2.4)

## 2020-09-05 LAB — PHOSPHORUS: Phosphorus: 3.9 mg/dL (ref 2.5–4.6)

## 2020-09-05 MED ORDER — TRAVASOL 10 % IV SOLN
INTRAVENOUS | Status: AC
  Filled 2020-09-05: qty 756

## 2020-09-05 MED ORDER — PROCHLORPERAZINE EDISYLATE 10 MG/2ML IJ SOLN
10.0000 mg | Freq: Four times a day (QID) | INTRAMUSCULAR | Status: AC | PRN
  Administered 2020-09-05: 10 mg via INTRAVENOUS
  Filled 2020-09-05: qty 2

## 2020-09-05 MED ORDER — DEXTROSE-NACL 5-0.9 % IV SOLN: INTRAVENOUS | Status: AC

## 2020-09-05 NOTE — Plan of Care (Signed)

## 2020-09-05 NOTE — Progress Notes (Addendum)
PHARMACY - TOTAL PARENTERAL NUTRITION CONSULT NOTE   Indication: on TPN prior to admission with hx Croh's disease with short gut syndrome  Patient Measurements: Height: 5' 8"  (172.7 cm) Weight: 63.5 kg (140 lb) IBW/kg (Calculated) : 63.9 TPN AdjBW (KG): 63.5 Body mass index is 21.29 kg/m.  Assessment: TPN was originally on hold due to possibility of PICC removal but ID has decided that will try and keep the line for now and treat bacteremia. As a result, will restart patient's TPN this PM. Per Dr. Linus Salmons on 10/10, ok to resume TPN thru current PICC line  Glucose / Insulin: CBG stable < 150, no SSI given Electrolytes: Na 134, other lytes WNL Renal: SCr up to 1.65 BUN up to 29 LFTs / TGs: AST/ALT down from yesterday. TG 54 Prealbumin / albumin: 30.5/3 Intake / Output; MIVF: D5NS 75 ml/hr from 8a- 6 pm while TPN not running.  GI Imaging:  Surgeries / Procedures: n/a  Central access: PICC line TPN start date: on PTA  Nutritional Goals (per RD recommendation on 10/9): kCal: 1905-2100, Protein: 95-110, Fluid: >= 2L/day  Per home TPN formula, 1800 mL over 14 hours to provide 75g protein and 1081 kcal with no lipid and when lipid added to TPN, provides 75g protein and 1481 kcal  Current Nutrition:  Regular diet + cyclic TPN -Prosource 30 ml BID, [place] 1.4 qday per RD  Plan:  1800 mL of TPN over 14 hours to provide 75g protein and 1458 Kcal - will add back lipids today  Electrolytes in TPN: 71mq/L of Na, 53m/L of K, 73m9mL of Ca, 73mE18m of Mg, and 173mm773m of Phos. Cl:Ac ratio 1:1  MVI liquid being given PO; trace elements added to TPN  DC SSI/cbgs  D5NS at 75 ml/hr from 8am to 6pm daily, OFF while TPN running- need to order daily by Rx  Monitor TPN labs on Mon/Thurs  MicheEudelia [email or web address] 09/05/2020 8:27 AM

## 2020-09-05 NOTE — Care Management Important Message (Signed)
Important Message  Patient Details IM Letter given to the Patient Name: Caitlin Peters MRN: [id number] Date of Birth: 12/28/1960   Medicare Important Message Given:  Yes     [name] 09/05/2020, 12:17 PM

## 2020-09-05 NOTE — Progress Notes (Signed)
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  Repeat bcx out of peripheral is ngtd on 10/07  tte no evidence valvular vegetation  She is tolerating bactrim high dose although her creatinine is slowly rising   Of note ucx with several CoNS species which are not pathogenic in her cases and represent assyptomatic bacteriuria    - continue with bactrim plan to treat for 14 days - maintain line for now unless recurrent episodes or fever (then will need simultaneous peripheral and picc blood culture) - if creatinine continues to rise, will consider switching bactrim to levofloxacin +/- another abx depending on severity of illness   Principal Problem:   Sepsis ([place]) Active Problems:   Crohn's colitis ([place])   Crohn disease ([place])   On total parenteral nutrition (TPN)   Abnormal LFTs   Stage 3b chronic kidney disease ([place])   Bacteremia   Scheduled Meds: . (feeding supplement) PROSource Plus  30 mL Oral BID BM  . amLODipine  10 mg Oral Daily  . budesonide  3 mg Oral Daily  . buPROPion  200 mg Oral Daily  . Chlorhexidine Gluconate Cloth  6 each Topical Daily  . cholecalciferol  1,000 Units Oral Daily  . cycloSPORINE  1 drop Both Eyes BID  . dicyclomine  20 mg Oral TID  . DULoxetine  90 mg Oral Daily  . estradiol  2 mg Oral Daily  . famotidine  20 mg Oral QHS  . feeding supplement ([employer] [place] STANDARD 1.4)  325 mL Oral Daily  . hydrALAZINE  50 mg Oral TID  . metoprolol tartrate  100 mg Oral BID  . multivitamin  15 mL Oral Daily  . omega-3  acid ethyl esters  1,000 mg Oral Daily  . pantoprazole  40 mg Oral Daily  . polyvinyl alcohol  1 drop Both Eyes QID  . senna-docusate  1 tablet Oral BID  . sodium chloride flush  10-40 mL Intracatheter Q12H  . sucralfate  1 g Oral TID WC & HS  . sulfamethoxazole-trimethoprim  2 tablet Oral Q8H  . vitamin B-12  1,000 mcg Oral Daily   Continuous Infusions: . TPN CYCLIC-ADULT (ION)     PRN Meds:.calcium carbonate, diphenoxylate-atropine, HYDROmorphone (DILAUDID) injection, lipase/protease/amylase, loperamide, ondansetron (ZOFRAN) IV, promethazine, sodium chloride flush, traZODone   SUBJECTIVE: Doing better Fever quickly defervesced since admission Repeat bcx 10/7 ngtd No n/v/diarrhea/rash No cough/flank pain Tolerating oral intake  Review of Systems: ROS Negative 11 point ros unless mentioned above  Allergies  Allergen Reactions  . Cefepime Other (See Comments)    Neurotoxicity occurring in setting of AKI. Ceftriaxone tolerated during same admit  . Gabapentin Other (See Comments)    unknown  . Hyoscyamine Hives and Swelling    Legs swelling   Disorientation  . Lyrica [Pregabalin] Other (See Comments)    unknown  . Meperidine Hives    Other reaction(s): GI Upset Due to Chrones   . Topamax [Topiramate] Other (See Comments)    unknown  . Zosyn [Piperacillin Sod-Tazobactam So]  Patient reports it makes her vomit, her neck stiff, and her "heart feel funny"  . Fentanyl Rash    Pt is allergic to fentanyl patch related to the glue (gives her a rash) Pt states she is NOT allergic to fentanyl IV medicine  . Morphine And Related Rash    OBJECTIVE: Vitals:   09/04/20 0706 09/04/20 1300 09/04/20 2240 09/05/20 0621  BP: (!) 154/83 138/62 (!) 146/63 (!) 154/70  Pulse: 70 60 61 (!) 109  Resp: 17 18 18 18   Temp: 98.8 F (37.1 C) 98.4 F (36.9 C) 98.4 F (36.9 C) 98.9 F (37.2 C)  TempSrc: Oral Oral Oral Oral  SpO2: 99% 100% 99% 99%  Weight:      Height:       Body  mass index is 21.29 kg/m.  Physical Exam No distress, pleasant Psych alert/oriented heent atraumatic; per; conj clear cv rrr no mrg Lungs clear abd s/nt Ext no edema Skin no rash; Right femoral picc site no tenderness/purulence Neuro nonfocal  Lab Results Lab Results  Component Value Date   WBC 3.5 (L) 09/05/2020   HGB 8.8 (L) 09/05/2020   HCT 26.4 (L) 09/05/2020   MCV 90.7 09/05/2020   PLT 157 09/05/2020    Lab Results  Component Value Date   CREATININE 1.65 (H) 09/05/2020   BUN 29 (H) 09/05/2020   NA 134 (L) 09/05/2020   K 4.6 09/05/2020   CL 105 09/05/2020   CO2 21 (L) 09/05/2020    Lab Results  Component Value Date   ALT 134 (H) 09/05/2020   AST 24 09/05/2020   ALKPHOS 193 (H) 09/05/2020   BILITOT 0.5 09/05/2020     Microbiology: Recent Results (from the past 240 hour(s))  Blood culture (routine x 2)     Status: Abnormal   Collection Time: 08/30/20 10:50 PM   Specimen: BLOOD  Result Value Ref Range Status   Specimen Description   Final    BLOOD CENTRAL LINE Performed at [facility], [place] [address], [place], [place] [address]    Special Requests   Final    BOTTLES DRAWN AEROBIC AND ANAEROBIC Blood Culture adequate volume Performed at [facility] [address] [place], [place] [address]    Culture  Setup Time   Final    GRAM NEGATIVE RODS AEROBIC BOTTLE ONLY CRITICAL RESULT CALLED TO, READ BACK BY AND VERIFIED WITH: L ADKINS RN 08/31/20 2114 JDW Performed at [facility], [place] [address], [place], [place] [address]    Culture STENOTROPHOMONAS MALTOPHILIA (A)  Final   Report Status 09/02/2020 FINAL  Final   Organism ID, Bacteria STENOTROPHOMONAS MALTOPHILIA  Final      Susceptibility   Stenotrophomonas maltophilia - MIC*    LEVOFLOXACIN 0.5 SENSITIVE Sensitive     TRIMETH/SULFA <=20 SENSITIVE Sensitive     * STENOTROPHOMONAS MALTOPHILIA  Blood Culture ID Panel (Reflexed)     Status: Abnormal    Collection Time: 08/30/20 10:50 PM  Result Value Ref Range Status   Enterococcus faecalis NOT DETECTED NOT DETECTED Final   Enterococcus Faecium NOT DETECTED NOT DETECTED Final   Listeria monocytogenes NOT DETECTED NOT DETECTED Final   Staphylococcus species NOT DETECTED NOT DETECTED Final   Staphylococcus aureus (BCID) NOT DETECTED NOT DETECTED Final   Staphylococcus epidermidis NOT DETECTED NOT DETECTED Final   Staphylococcus lugdunensis NOT DETECTED NOT DETECTED Final   Streptococcus species NOT DETECTED NOT DETECTED Final   Streptococcus agalactiae NOT DETECTED NOT DETECTED  Final   Streptococcus pneumoniae NOT DETECTED NOT DETECTED Final   Streptococcus pyogenes NOT DETECTED NOT DETECTED Final   A.calcoaceticus-baumannii NOT DETECTED NOT DETECTED Final   Bacteroides fragilis NOT DETECTED NOT DETECTED Final   Enterobacterales NOT DETECTED NOT DETECTED Final   Enterobacter cloacae complex NOT DETECTED NOT DETECTED Final   Escherichia coli NOT DETECTED NOT DETECTED Final   Klebsiella aerogenes NOT DETECTED NOT DETECTED Final   Klebsiella oxytoca NOT DETECTED NOT DETECTED Final   Klebsiella pneumoniae NOT DETECTED NOT DETECTED Final   Proteus species NOT DETECTED NOT DETECTED Final   Salmonella species NOT DETECTED NOT DETECTED Final   Serratia marcescens NOT DETECTED NOT DETECTED Final   Haemophilus influenzae NOT DETECTED NOT DETECTED Final   Neisseria meningitidis NOT DETECTED NOT DETECTED Final   Pseudomonas aeruginosa NOT DETECTED NOT DETECTED Final   Stenotrophomonas maltophilia DETECTED (A) NOT DETECTED Final    Comment: CRITICAL RESULT CALLED TO, READ BACK BY AND VERIFIED WITH: L ADKINS RN 08/31/20 2114 JDW    Candida albicans NOT DETECTED NOT DETECTED Final   Candida auris NOT DETECTED NOT DETECTED Final   Candida glabrata NOT DETECTED NOT DETECTED Final   Candida krusei NOT DETECTED NOT DETECTED Final   Candida parapsilosis NOT DETECTED NOT DETECTED Final   Candida  tropicalis NOT DETECTED NOT DETECTED Final   Cryptococcus neoformans/gattii NOT DETECTED NOT DETECTED Final    Comment: Performed at [facility], [address] [place],  [address]  Respiratory Panel by RT PCR (Flu A&B, Covid) - Nasopharyngeal Swab     Status: None   Collection Time: 08/31/20 12:03 AM   Specimen: Nasopharyngeal Swab  Result Value Ref Range Status   SARS Coronavirus 2 by RT PCR NEGATIVE NEGATIVE Final    Comment: (NOTE) SARS-CoV-2 target nucleic acids are NOT DETECTED.  The SARS-CoV-2 RNA is generally detectable in upper respiratoy specimens during the acute phase of infection. The lowest concentration of SARS-CoV-2 viral copies this assay can detect is 131 copies/mL. A negative result does not preclude SARS-Cov-2 infection and should not be used as the sole basis for treatment or other patient management decisions. A negative result may occur with  improper specimen collection/handling, submission of specimen other than nasopharyngeal swab, presence of viral mutation(s) within the areas targeted by this assay, and inadequate number of viral copies (<131 copies/mL). A negative result must be combined with clinical observations, patient history, and epidemiological information. The expected result is Negative.  Fact Sheet for Patients:  PinkCheek.be  Fact Sheet for Healthcare Providers:  GravelBags.it  This test is no t yet approved or cleared by the [place] FDA and  has been authorized for detection and/or diagnosis of SARS-CoV-2 by FDA under an Emergency Use Authorization (EUA). This EUA will remain  in effect (meaning this test can be used) for the duration of the COVID-19 declaration under Section 564(b)(1) of the Act, 21 U.S.C. section 360bbb-3(b)(1), unless the authorization is terminated or revoked sooner.     Influenza A by PCR NEGATIVE NEGATIVE Final   Influenza B by PCR  NEGATIVE NEGATIVE Final    Comment: (NOTE) The Xpert Xpress SARS-CoV-2/FLU/RSV assay is intended as an aid in  the diagnosis of influenza from Nasopharyngeal swab specimens and  should not be used as a sole basis for treatment. Nasal washings and  aspirates are unacceptable for Xpert Xpress SARS-CoV-2/FLU/RSV  testing.  Fact Sheet for Patients: PinkCheek.be  Fact Sheet for Healthcare Providers: GravelBags.it  This test is not  yet approved or cleared by the [place] and  has been authorized for detection and/or diagnosis of SARS-CoV-2 by  FDA under an Emergency Use Authorization (EUA). This EUA will remain  in effect (meaning this test can be used) for the duration of the  Covid-19 declaration under Section 564(b)(1) of the Act, 21  U.S.C. section 360bbb-3(b)(1), unless the authorization is  terminated or revoked. Performed at [facility], [place] [address] [place], [place] [address]   Urine culture     Status: Abnormal   Collection Time: 08/31/20  3:30 AM   Specimen: In/Out Cath Urine  Result Value Ref Range Status   Specimen Description   Final    IN/OUT CATH URINE Performed at [facility] [address], [place], [place] [address]    Special Requests   Final    NONE Performed at [facility], [place] [address] [place], [place] [address]    Culture (A)  Final    100 COLONIES/mL STAPHYLOCOCCUS EPIDERMIDIS 100 COLONIES/mL STAPHYLOCOCCUS WARNERI    Report Status 09/02/2020 FINAL  Final   Organism ID, Bacteria STAPHYLOCOCCUS EPIDERMIDIS (A)  Final   Organism ID, Bacteria STAPHYLOCOCCUS WARNERI (A)  Final      Susceptibility   Staphylococcus epidermidis - MIC*    CIPROFLOXACIN >=8 RESISTANT Resistant     GENTAMICIN 8 INTERMEDIATE Intermediate     NITROFURANTOIN <=16 SENSITIVE Sensitive     OXACILLIN >=4 RESISTANT Resistant     TETRACYCLINE <=1  SENSITIVE Sensitive     VANCOMYCIN 2 SENSITIVE Sensitive     TRIMETH/SULFA 80 RESISTANT Resistant     CLINDAMYCIN >=8 RESISTANT Resistant     RIFAMPIN <=0.5 SENSITIVE Sensitive     Inducible Clindamycin NEGATIVE Sensitive     * 100 COLONIES/mL STAPHYLOCOCCUS EPIDERMIDIS   Staphylococcus warneri - MIC*    CIPROFLOXACIN <=0.5 SENSITIVE Sensitive     GENTAMICIN <=0.5 SENSITIVE Sensitive     NITROFURANTOIN 32 SENSITIVE Sensitive     OXACILLIN <=0.25 SENSITIVE Sensitive     TETRACYCLINE <=1 SENSITIVE Sensitive     VANCOMYCIN 1 SENSITIVE Sensitive     TRIMETH/SULFA <=10 SENSITIVE Sensitive     CLINDAMYCIN >=8 RESISTANT Resistant     RIFAMPIN <=0.5 SENSITIVE Sensitive     Inducible Clindamycin NEGATIVE Sensitive     * 100 COLONIES/mL STAPHYLOCOCCUS WARNERI  Blood Culture (routine x 2)     Status: Abnormal   Collection Time: 08/31/20 11:35 PM   Specimen: BLOOD  Result Value Ref Range Status   Specimen Description   Final    BLOOD PICC LINE Performed at [facility], [place] [address] [place], [place] [address]    Special Requests   Final    BOTTLES DRAWN AEROBIC AND ANAEROBIC Blood Culture adequate volume Performed at [facility] [address], [place], [place] [address]    Culture  Setup Time   Final    GRAM NEGATIVE RODS AEROBIC BOTTLE ONLY CRITICAL VALUE NOTED.  VALUE IS CONSISTENT WITH PREVIOUSLY REPORTED AND CALLED VALUE.    Culture (A)  Final    STENOTROPHOMONAS MALTOPHILIA SUSCEPTIBILITIES PERFORMED ON PREVIOUS CULTURE WITHIN THE LAST 5 DAYS. Performed at [facility], [place] [address], [place], [place] [address]    Report Status 09/02/2020 FINAL  Final  Respiratory Panel by RT PCR (Flu A&B, Covid) - Nasopharyngeal Swab     Status: None   Collection Time: 09/01/20  3:34 AM   Specimen: Nasopharyngeal Swab  Result Value Ref Range Status   SARS Coronavirus 2 by RT PCR NEGATIVE NEGATIVE Final    Comment: (NOTE) SARS-CoV-2 target  nucleic acids are NOT DETECTED.  The SARS-CoV-2 RNA is generally detectable in upper respiratoy specimens during the acute phase of infection. The lowest concentration of SARS-CoV-2 viral copies this assay can detect is 131 copies/mL. A negative result does not preclude SARS-Cov-2 infection and should not be used as the sole basis for treatment or other patient management decisions. A negative result may occur with  improper specimen collection/handling, submission of specimen other than nasopharyngeal swab, presence of viral mutation(s) within the areas targeted by this assay, and inadequate number of viral copies (<131 copies/mL). A negative result must be combined with clinical observations, patient history, and epidemiological information. The expected result is Negative.  Fact Sheet for Patients:  PinkCheek.be  Fact Sheet for Healthcare Providers:  GravelBags.it  This test is no t yet approved or cleared by the [place] FDA and  has been authorized for detection and/or diagnosis of SARS-CoV-2 by FDA under an Emergency Use Authorization (EUA). This EUA will remain  in effect (meaning this test can be used) for the duration of the COVID-19 declaration under Section 564(b)(1) of the Act, 21 U.S.C. section 360bbb-3(b)(1), unless the authorization is terminated or revoked sooner.     Influenza A by PCR NEGATIVE NEGATIVE Final   Influenza B by PCR NEGATIVE NEGATIVE Final    Comment: (NOTE) The Xpert Xpress SARS-CoV-2/FLU/RSV assay is intended as an aid in  the diagnosis of influenza from Nasopharyngeal swab specimens and  should not be used as a sole basis for treatment. Nasal washings and  aspirates are unacceptable for Xpert Xpress SARS-CoV-2/FLU/RSV  testing.  Fact Sheet for Patients: PinkCheek.be  Fact Sheet for Healthcare  Providers: GravelBags.it  This test is not yet approved or cleared by the [place] FDA and  has been authorized for detection and/or diagnosis of SARS-CoV-2 by  FDA under an Emergency Use Authorization (EUA). This EUA will remain  in effect (meaning this test can be used) for the duration of the  Covid-19 declaration under Section 564(b)(1) of the Act, 21  U.S.C. section 360bbb-3(b)(1), unless the authorization is  terminated or revoked. Performed at [facility], [place] [address], [place], [place] [address]   Blood Culture (routine x 2)     Status: None (Preliminary result)   Collection Time: 09/01/20  5:58 AM   Specimen: BLOOD LEFT HAND  Result Value Ref Range Status   Specimen Description   Final    BLOOD LEFT HAND Performed at [facility] [address] [name], [place] [address]    Special Requests   Final    BOTTLES DRAWN AEROBIC ONLY Blood Culture adequate volume Performed at [facility] [address] [place], [place] [address]    Culture   Final    NO GROWTH 4 DAYS Performed at [facility], [place] [address] [place], [place] [address]    Report Status PENDING  Incomplete    Serology:    Jabier Mutton, [place] for [place] [phone] pager    09/05/2020, 1:22 PM

## 2020-09-05 NOTE — Progress Notes (Signed)
PROGRESS NOTE    Caitlin Peters  MAU:[id number] DOB: 20-Oct-1961 DOA: 08/31/2020 PCP: Erline Hau, MD    Brief Narrative:  59 y.o. female with history of Crohn's disease, chronic kidney disease stage III, hypertension, chronic anemia thrombocytopenia followed by gastroenterologist at [facility] recently admitted for sepsis discharged home on August 12, 2020 about 20 days ago had come to the ER a day ago for fever at home and as per the patient patient recorded fever 102 F with some chills.  Patient has chronic abdominal pain and loose stools.  Denies any chest pain shortness of breath productive cough or vomiting.  In the ER patient had a CT abdomen pelvis which did not show anything acute LFTs are markedly elevated at that time.  Blood cultures obtained and patient was discharged home.  Blood cultures grew  Enterobacter cloacae complex,Enterobacterales,Stenotrophomonas maltophilia.  Patient was advised to come back to the ER.  Patient is on TPN and has a right femoral PICC line which could be the source.  Assessment & Plan:   Principal Problem:   Sepsis ([place]) Active Problems:   Crohn's colitis ([place])   Crohn disease ([place])   On total parenteral nutrition (TPN)   Abnormal LFTs   Stage 3b chronic kidney disease ([place])   Bacteremia associated with intravascular line ([place])  1. Bacteremia with sepsis present on admit 1. Presenting fever, tachycardia, leukopenia 2. Blood cx on 10/5 thus far pos for Stenotrophomonas maltophilia 3. Chart reviewed. 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 Pt with R femoral picc placed last month. Pt known to have very difficult access with limited sites 5. ID consulted regarding recurrent bacteremia. Recommendation to keep PICC in place 6. 2d echo ordered and results reviewed. Finding of normal LVEF. No mention of vegetation 7. Per ID, will need repeat peripheral blood culture at the end of treatment  course 8. Currently continued on PO bactrim, plan for x 14 days per ID recs 9. Cr is slightly higher today. Per ID, if renal function does not tolerate bactrim, then plan to transition to levofloxacin +/- another abx 10. Repeat bmet in AM 2. Abnormal LFTs are improving 1. Tolerating diet, denies RUQ pain 2. Repeat lft in AM 3. LFT's are improving 3. Acute on chronic kidney disease stage III 1. On IVF hydration 2. Renal function presented near baseline 3. Cr up to 1.65 today while on bactrim 4. Recheck bmet in AM 4. Chronic anemia and thrombocytopenia 1. Suspect secondary to presenting sepsis. 2. Plts up to 138 on most recent check 3. Recheck CBC in AM. Pt hemodynamically stable 5. Hypertension 1.  continue amlodipine beta-blockers and hydralazine 2. Stable and controlled currently 6. 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   1. Being followed at [facility]. 2. TPN was initially on hold 3. Now on regular diet but pt not tolerating secondary to abd discomfort 4. Have continued on TPN as of 10/10 after discussion with ID 7. Chronic pain 1.  on methadone and presently on Dilaudid.  DVT prophylaxis: SCD's Code Status: Full Family Communication: Pt in room, family not at bedside  Status is: Inpatient  Remains inpatient appropriate because:Unsafe d/c plan, IV treatments appropriate due to intensity of illness or inability to take PO and Inpatient level of care appropriate due to severity of illness   Dispo: The patient is from: Home              Anticipated d/c is to:  Home              Anticipated d/c date is: > 3 days              Patient currently is not medically stable to d/c.  Consultants:   ID  IR  Procedures:     Antimicrobials: Anti-infectives (From admission, onward)   Start     Dose/Rate Route Frequency Ordered Stop   09/04/20 1400  sulfamethoxazole-trimethoprim (BACTRIM DS) 800-160 MG per tablet 2 tablet        2 tablet Oral Every 8 hours 09/04/20  1006     09/04/20 1000  sulfamethoxazole-trimethoprim (BACTRIM DS) 800-160 MG per tablet 1 tablet  Status:  Discontinued        1 tablet Oral Every 12 hours 09/04/20 0945 09/04/20 1004   09/01/20 1600  sulfamethoxazole-trimethoprim (BACTRIM) 320 mg in dextrose 5 % 500 mL IVPB  Status:  Discontinued        320 mg 346.7 mL/hr over 90 Minutes Intravenous Every 8 hours 09/01/20 0819 09/04/20 0945   09/01/20 0000  sulfamethoxazole-trimethoprim (BACTRIM) 317.44 mg in dextrose 5 % 500 mL IVPB  Status:  Discontinued        15 mg/kg/day  63.5 kg 346.6 mL/hr over 90 Minutes Intravenous Every 8 hours 08/31/20 2356 09/01/20 0819      Subjective: Complains of abd discomfort which pt intermittently experiences at home  Objective: Vitals:   09/04/20 1300 09/04/20 2240 09/05/20 0621 09/05/20 1447  BP: 138/62 (!) 146/63 (!) 154/70 (!) 145/96  Pulse: 60 61 (!) 109 64  Resp: 18 18 18 18   Temp: 98.4 F (36.9 C) 98.4 F (36.9 C) 98.9 F (37.2 C) 99 F (37.2 C)  TempSrc: Oral Oral Oral Oral  SpO2: 100% 99% 99% 96%  Weight:      Height:        Intake/Output Summary (Last 24 hours) at 09/05/2020 1543 Last data filed at 09/05/2020 0758 Gross per 24 hour  Intake 1880.45 ml  Output 200 ml  Net 1680.45 ml   Filed Weights   09/01/20 0510  Weight: 63.5 kg    Examination: General exam: Conversant, appears somewhat uncomfortable Respiratory system: normal chest rise, clear, no audible wheezing Cardiovascular system: regular rhythm, s1-s2 Gastrointestinal system: Nondistended, generally tender, pos BS Central nervous system: No seizures, no tremors Extremities: No cyanosis, no joint deformities Skin: No rashes, no pallor Psychiatry: Affect normal // no auditory hallucinations   Data Reviewed: I have personally reviewed following labs and imaging studies  CBC: Recent Labs  Lab 08/30/20 2249 08/30/20 2249 08/31/20 2335 09/01/20 0558 09/02/20 0324 09/03/20 0350 09/05/20 0338  WBC 1.7*    < > 6.3 4.7 3.6* 2.7* 3.5*  NEUTROABS 1.5*  --  4.8  --  2.3  --  1.7  HGB 10.2*   < > 8.0* 7.8* 7.4* 8.0* 8.8*  HCT 30.7*   < > 24.3* 25.0* 22.9* 24.3* 26.4*  MCV 91.4   < > 93.5 96.9 92.0 90.0 90.7  PLT 125*   < > 119* 94* 125* 138* 157   < > = values in this interval not displayed.   Basic Metabolic Panel: Recent Labs  Lab 09/01/20 0558 09/02/20 0324 09/03/20 0350 09/04/20 0416 09/05/20 0338  NA 136 135 139 135 134*  K 3.6 3.8 3.5 4.4 4.6  CL 103 105 107 105 105  CO2 22 24 23 22  21*  GLUCOSE 97 87 94 100* 101*  BUN 27* 16 11 22*  29*  CREATININE 1.58* 1.54* 1.47* 1.53* 1.65*  CALCIUM 9.3 8.8* 9.5 9.7 9.6  MG 1.7 1.6*  --  2.1 2.0  PHOS 2.9 2.6  --  3.3 3.9   GFR: Estimated Creatinine Clearance: 36.8 mL/min (A) (by C-G formula based on SCr of 1.65 mg/dL (H)). Liver Function Tests: Recent Labs  Lab 09/01/20 0558 09/02/20 0324 09/03/20 0350 09/04/20 0416 09/05/20 0338  AST 259* 113* 56* 37 24  ALT 458* 316* 244* 185* 134*  ALKPHOS 211* 190* 203* 206* 193*  BILITOT 1.0 0.7 0.8 0.7 0.5  PROT 6.5 6.0* 6.6 6.8 6.8  ALBUMIN 2.8* 2.8* 3.0* 2.9* 3.0*   No results for input(s): LIPASE, AMYLASE in the last 168 hours. No results for input(s): AMMONIA in the last 168 hours. Coagulation Profile: Recent Labs  Lab 08/31/20 2335  INR 1.0   Cardiac Enzymes: No results for input(s): CKTOTAL, CKMB, CKMBINDEX, TROPONINI in the last 168 hours. BNP (last 3 results) No results for input(s): PROBNP in the last 8760 hours. HbA1C: No results for input(s): HGBA1C in the last 72 hours. CBG: Recent Labs  Lab 09/04/20 0742 09/04/20 1411 09/04/20 1959 09/05/20 0225 09/05/20 0745  GLUCAP 109* 87 113* 110* 108*   Lipid Profile: Recent Labs    09/05/20 0338  TRIG 54   Thyroid Function Tests: No results for input(s): TSH, T4TOTAL, FREET4, T3FREE, THYROIDAB in the last 72 hours. Anemia Panel: No results for input(s): VITAMINB12, FOLATE, FERRITIN, TIBC, IRON, RETICCTPCT in  the last 72 hours. Sepsis Labs: Recent Labs  Lab 08/30/20 2315 08/31/20 2335  LATICACIDVEN 1.2 0.5    Recent Results (from the past 240 hour(s))  Blood culture (routine x 2)     Status: Abnormal   Collection Time: 08/30/20 10:50 PM   Specimen: BLOOD  Result Value Ref Range Status   Specimen Description   Final    BLOOD CENTRAL LINE Performed at [facility], [place] [address] [place], [place] [address]    Special Requests   Final    BOTTLES DRAWN AEROBIC AND ANAEROBIC Blood Culture adequate volume Performed at [place] [address] [place], [place] [address]    Culture  Setup Time   Final    GRAM NEGATIVE RODS AEROBIC BOTTLE ONLY CRITICAL RESULT CALLED TO, READ BACK BY AND VERIFIED WITH: L ADKINS RN 08/31/20 2114 JDW Performed at [facility], [place] [address], [place], [place] [address]    Culture STENOTROPHOMONAS MALTOPHILIA (A)  Final   Report Status 09/02/2020 FINAL  Final   Organism ID, Bacteria STENOTROPHOMONAS MALTOPHILIA  Final      Susceptibility   Stenotrophomonas maltophilia - MIC*    LEVOFLOXACIN 0.5 SENSITIVE Sensitive     TRIMETH/SULFA <=20 SENSITIVE Sensitive     * STENOTROPHOMONAS MALTOPHILIA  Blood Culture ID Panel (Reflexed)     Status: Abnormal   Collection Time: 08/30/20 10:50 PM  Result Value Ref Range Status   Enterococcus faecalis NOT DETECTED NOT DETECTED Final   Enterococcus Faecium NOT DETECTED NOT DETECTED Final   Listeria monocytogenes NOT DETECTED NOT DETECTED Final   Staphylococcus species NOT DETECTED NOT DETECTED Final   Staphylococcus aureus (BCID) NOT DETECTED NOT DETECTED Final   Staphylococcus epidermidis NOT DETECTED NOT DETECTED Final   Staphylococcus lugdunensis NOT DETECTED NOT DETECTED Final   Streptococcus species NOT DETECTED NOT DETECTED Final   Streptococcus agalactiae NOT DETECTED NOT DETECTED Final   Streptococcus pneumoniae NOT DETECTED NOT DETECTED Final   Streptococcus  pyogenes NOT DETECTED NOT DETECTED Final   A.calcoaceticus-baumannii NOT DETECTED NOT DETECTED Final   Bacteroides fragilis NOT DETECTED NOT DETECTED Final   Enterobacterales NOT DETECTED NOT DETECTED Final   Enterobacter cloacae complex NOT DETECTED NOT DETECTED Final   Escherichia coli NOT DETECTED NOT DETECTED Final   Klebsiella aerogenes NOT DETECTED NOT DETECTED Final   Klebsiella oxytoca NOT DETECTED NOT DETECTED Final   Klebsiella pneumoniae NOT DETECTED NOT DETECTED Final   Proteus species NOT DETECTED NOT DETECTED Final   Salmonella species NOT DETECTED NOT DETECTED Final   Serratia marcescens NOT DETECTED NOT DETECTED Final   Haemophilus influenzae NOT DETECTED NOT DETECTED Final   Neisseria meningitidis NOT DETECTED NOT DETECTED Final   Pseudomonas aeruginosa NOT DETECTED NOT DETECTED Final   Stenotrophomonas maltophilia DETECTED (A) NOT DETECTED Final    Comment: CRITICAL RESULT CALLED TO, READ BACK BY AND VERIFIED WITH: L ADKINS RN 08/31/20 2114 JDW    Candida albicans NOT DETECTED NOT DETECTED Final   Candida auris NOT DETECTED NOT DETECTED Final   Candida glabrata NOT DETECTED NOT DETECTED Final   Candida krusei NOT DETECTED NOT DETECTED Final   Candida parapsilosis NOT DETECTED NOT DETECTED Final   Candida tropicalis NOT DETECTED NOT DETECTED Final   Cryptococcus neoformans/gattii NOT DETECTED NOT DETECTED Final    Comment: Performed at [facility], [address], [place], [place] [address]  Respiratory Panel by RT PCR (Flu A&B, Covid) - Nasopharyngeal Swab     Status: None   Collection Time: 08/31/20 12:03 AM   Specimen: Nasopharyngeal Swab  Result Value Ref Range Status   SARS Coronavirus 2 by RT PCR NEGATIVE NEGATIVE Final    Comment: (NOTE) SARS-CoV-2 target nucleic acids are NOT DETECTED.  The SARS-CoV-2 RNA is generally detectable in upper respiratoy specimens during the acute phase of infection. The lowest concentration of SARS-CoV-2 viral copies  this assay can detect is 131 copies/mL. A negative result does not preclude SARS-Cov-2 infection and should not be used as the sole basis for treatment or other patient management decisions. A negative result may occur with  improper specimen collection/handling, submission of specimen other than nasopharyngeal swab, presence of viral mutation(s) within the areas targeted by this assay, and inadequate number of viral copies (<131 copies/mL). A negative result must be combined with clinical observations, patient history, and epidemiological information. The expected result is Negative.  Fact Sheet for Patients:  PinkCheek.be  Fact Sheet for Healthcare Providers:  GravelBags.it  This test is no t yet approved or cleared by the [place] FDA and  has been authorized for detection and/or diagnosis of SARS-CoV-2 by FDA under an Emergency Use Authorization (EUA). This EUA will remain  in effect (meaning this test can be used) for the duration of the COVID-19 declaration under Section 564(b)(1) of the Act, 21 U.S.C. section 360bbb-3(b)(1), unless the authorization is terminated or revoked sooner.     Influenza A by PCR NEGATIVE NEGATIVE Final   Influenza B by PCR NEGATIVE NEGATIVE Final    Comment: (NOTE) The Xpert Xpress SARS-CoV-2/FLU/RSV assay is intended as an aid in  the diagnosis of influenza from Nasopharyngeal swab specimens and  should not be used as a sole basis for treatment. Nasal washings and  aspirates are unacceptable for Xpert Xpress SARS-CoV-2/FLU/RSV  testing.  Fact Sheet for Patients: PinkCheek.be  Fact Sheet for Healthcare Providers: GravelBags.it  This test is not yet approved or cleared by the [place] and  has been authorized  for detection and/or diagnosis of SARS-CoV-2 by  FDA under an Emergency Use Authorization (EUA). This EUA  will remain  in effect (meaning this test can be used) for the duration of the  Covid-19 declaration under Section 564(b)(1) of the Act, 21  U.S.C. section 360bbb-3(b)(1), unless the authorization is  terminated or revoked. Performed at [facility], [place] [address], [place], [place] [address]   Urine culture     Status: Abnormal   Collection Time: 08/31/20  3:30 AM   Specimen: In/Out Cath Urine  Result Value Ref Range Status   Specimen Description   Final    IN/OUT CATH URINE Performed at [place] [address], [place], [place] [address]    Special Requests   Final    NONE Performed at [facility], [place] [address] [place], [place] [address]    Culture (A)  Final    100 COLONIES/mL STAPHYLOCOCCUS EPIDERMIDIS 100 COLONIES/mL STAPHYLOCOCCUS WARNERI    Report Status 09/02/2020 FINAL  Final   Organism ID, Bacteria STAPHYLOCOCCUS EPIDERMIDIS (A)  Final   Organism ID, Bacteria STAPHYLOCOCCUS WARNERI (A)  Final      Susceptibility   Staphylococcus epidermidis - MIC*    CIPROFLOXACIN >=8 RESISTANT Resistant     GENTAMICIN 8 INTERMEDIATE Intermediate     NITROFURANTOIN <=16 SENSITIVE Sensitive     OXACILLIN >=4 RESISTANT Resistant     TETRACYCLINE <=1 SENSITIVE Sensitive     VANCOMYCIN 2 SENSITIVE Sensitive     TRIMETH/SULFA 80 RESISTANT Resistant     CLINDAMYCIN >=8 RESISTANT Resistant     RIFAMPIN <=0.5 SENSITIVE Sensitive     Inducible Clindamycin NEGATIVE Sensitive     * 100 COLONIES/mL STAPHYLOCOCCUS EPIDERMIDIS   Staphylococcus warneri - MIC*    CIPROFLOXACIN <=0.5 SENSITIVE Sensitive     GENTAMICIN <=0.5 SENSITIVE Sensitive     NITROFURANTOIN 32 SENSITIVE Sensitive     OXACILLIN <=0.25 SENSITIVE Sensitive     TETRACYCLINE <=1 SENSITIVE Sensitive     VANCOMYCIN 1 SENSITIVE Sensitive     TRIMETH/SULFA <=10 SENSITIVE Sensitive     CLINDAMYCIN >=8 RESISTANT Resistant     RIFAMPIN <=0.5 SENSITIVE Sensitive      Inducible Clindamycin NEGATIVE Sensitive     * 100 COLONIES/mL STAPHYLOCOCCUS WARNERI  Blood Culture (routine x 2)     Status: Abnormal   Collection Time: 08/31/20 11:35 PM   Specimen: BLOOD  Result Value Ref Range Status   Specimen Description   Final    BLOOD PICC LINE Performed at [facility], [place] [address], [place], [place] [address]    Special Requests   Final    BOTTLES DRAWN AEROBIC AND ANAEROBIC Blood Culture adequate volume Performed at [place] [address] [place], [place] [address]    Culture  Setup Time   Final    GRAM NEGATIVE RODS AEROBIC BOTTLE ONLY CRITICAL VALUE NOTED.  VALUE IS CONSISTENT WITH PREVIOUSLY REPORTED AND CALLED VALUE.    Culture (A)  Final    STENOTROPHOMONAS MALTOPHILIA SUSCEPTIBILITIES PERFORMED ON PREVIOUS CULTURE WITHIN THE LAST 5 DAYS. Performed at [facility], [place] [address] [place], [place] [address]    Report Status 09/02/2020 FINAL  Final  Respiratory Panel by RT PCR (Flu A&B, Covid) - Nasopharyngeal Swab     Status: None   Collection Time: 09/01/20  3:34 AM   Specimen: Nasopharyngeal Swab  Result Value Ref Range Status   SARS Coronavirus 2 by RT  PCR NEGATIVE NEGATIVE Final    Comment: (NOTE) SARS-CoV-2 target nucleic acids are NOT DETECTED.  The SARS-CoV-2 RNA is generally detectable in upper respiratoy specimens during the acute phase of infection. The lowest concentration of SARS-CoV-2 viral copies this assay can detect is 131 copies/mL. A negative result does not preclude SARS-Cov-2 infection and should not be used as the sole basis for treatment or other patient management decisions. A negative result may occur with  improper specimen collection/handling, submission of specimen other than nasopharyngeal swab, presence of viral mutation(s) within the areas targeted by this assay, and inadequate number of viral copies (<131 copies/mL). A negative result must be combined with  clinical observations, patient history, and epidemiological information. The expected result is Negative.  Fact Sheet for Patients:  PinkCheek.be  Fact Sheet for Healthcare Providers:  GravelBags.it  This test is no t yet approved or cleared by the [place] FDA and  has been authorized for detection and/or diagnosis of SARS-CoV-2 by FDA under an Emergency Use Authorization (EUA). This EUA will remain  in effect (meaning this test can be used) for the duration of the COVID-19 declaration under Section 564(b)(1) of the Act, 21 U.S.C. section 360bbb-3(b)(1), unless the authorization is terminated or revoked sooner.     Influenza A by PCR NEGATIVE NEGATIVE Final   Influenza B by PCR NEGATIVE NEGATIVE Final    Comment: (NOTE) The Xpert Xpress SARS-CoV-2/FLU/RSV assay is intended as an aid in  the diagnosis of influenza from Nasopharyngeal swab specimens and  should not be used as a sole basis for treatment. Nasal washings and  aspirates are unacceptable for Xpert Xpress SARS-CoV-2/FLU/RSV  testing.  Fact Sheet for Patients: PinkCheek.be  Fact Sheet for Healthcare Providers: GravelBags.it  This test is not yet approved or cleared by the [place] FDA and  has been authorized for detection and/or diagnosis of SARS-CoV-2 by  FDA under an Emergency Use Authorization (EUA). This EUA will remain  in effect (meaning this test can be used) for the duration of the  Covid-19 declaration under Section 564(b)(1) of the Act, 21  U.S.C. section 360bbb-3(b)(1), unless the authorization is  terminated or revoked. Performed at [facility]: [place], [place] [address], [place], [place] [address]   Blood Culture (routine x 2)     Status: None (Preliminary result)   Collection Time: 09/01/20  5:58 AM   Specimen: BLOOD LEFT HAND  Result Value Ref Range Status    Specimen Description   Final    BLOOD LEFT HAND Performed at [place] [address], [place], [place] [address]    Special Requests   Final    BOTTLES DRAWN AEROBIC ONLY Blood Culture adequate volume Performed at [place] [address] [place], [place] [address]    Culture   Final    NO GROWTH 4 DAYS Performed at [facility], [place] [address], [place], [place] [address]    Report Status PENDING  Incomplete     Radiology Studies: No results found.  Scheduled Meds: . (feeding supplement) PROSource Plus  30 mL Oral BID BM  . amLODipine  10 mg Oral Daily  . budesonide  3 mg Oral Daily  . buPROPion  200 mg Oral Daily  . Chlorhexidine Gluconate Cloth  6 each Topical Daily  . cholecalciferol  1,000 Units Oral Daily  . cycloSPORINE  1 drop Both Eyes BID  . dicyclomine  20 mg Oral TID  . DULoxetine  90  mg Oral Daily  . estradiol  2 mg Oral Daily  . famotidine  20 mg Oral QHS  . feeding supplement ([employer] STANDARD 1.4)  325 mL Oral Daily  . hydrALAZINE  50 mg Oral TID  . metoprolol tartrate  100 mg Oral BID  . multivitamin  15 mL Oral Daily  . omega-3 acid ethyl esters  1,000 mg Oral Daily  . pantoprazole  40 mg Oral Daily  . polyvinyl alcohol  1 drop Both Eyes QID  . senna-docusate  1 tablet Oral BID  . sodium chloride flush  10-40 mL Intracatheter Q12H  . sucralfate  1 g Oral TID WC & HS  . sulfamethoxazole-trimethoprim  2 tablet Oral Q8H  . vitamin B-12  1,000 mcg Oral Daily   Continuous Infusions: . TPN CYCLIC-ADULT (ION)       LOS: 4 days   Marylu Lund, MD [facility] Pager On Amion  If 7PM-7AM, please contact night-coverage 09/05/2020, 3:43 PM

## 2020-09-05 NOTE — Evaluation (Signed)
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 Ambualted x 300'  Pushing IV pole. Patient  Can continue ambulation with staff. No acute PT needs.     Follow Up Recommendations No PT follow up    Equipment Recommendations  None recommended by PT    Recommendations for Other Services       Precautions / Restrictions Precautions Precautions: Fall      Mobility  Bed Mobility Overal bed mobility: Independent                Transfers Overall transfer level: Independent                  Ambulation/Gait Ambulation/Gait assistance: Modified independent (Device/Increase time)       Gait velocity: 300 pushing IV pole   General Gait Details: ambualting to BR withoput assistance.  Stairs            Wheelchair Mobility    Modified Rankin (Stroke Patients Only)       Balance                                             Pertinent Vitals/Pain Pain Score: 7  Pain Location: abd Pain Descriptors / Indicators: Moaning;Grimacing;Guarding Pain Intervention(s): Patient requesting pain meds-RN notified;Monitored during session    [place] expects to be discharged to:: Private residence Living Arrangements: Children Available Help at Discharge: Family;Available 24 hours/day Type of Home: Apartment Home Access: Stairs to enter Entrance Stairs-Rails: Right Entrance Stairs-Number of Steps: flight Home Layout: One level Home Equipment: Walker - 2 wheels;Walker - 4 wheels      Prior Function     Gait / Transfers Assistance Needed: Uses walker on a "bad day" - but predominantly does not need.  Uses rollator when going out.  ADL's / Homemaking Assistance Needed: Reports family watches her in the shower and assists her off the commode or out of the bed if needed "on a bad day."        Hand Dominance        Extremity/Trunk Assessment   Upper Extremity Assessment Upper Extremity Assessment: Overall WFL for tasks assessed    Lower Extremity Assessment Lower Extremity Assessment: Overall WFL for tasks assessed    Cervical / Trunk Assessment Cervical / Trunk Assessment: Normal  Communication   Communication: No difficulties  Cognition Arousal/Alertness: Awake/alert Behavior During Therapy: WFL for tasks assessed/performed Overall Cognitive Status: Within Functional Limits for tasks assessed                                        General Comments      Exercises     Assessment/Plan    PT Assessment Patent does not need any further PT services  PT Problem List         PT Treatment Interventions      PT Goals (Current goals can be found in the Care Plan section)  Acute Rehab PT Goals Patient Stated Goal:  HOME PT Goal Formulation: All assessment and education complete, DC therapy    Frequency     Barriers to discharge        Co-evaluation               AM-PAC PT "6 Clicks" Mobility  Outcome Measure Help needed turning from your back to your side while in a flat bed without using bedrails?: None Help needed moving from lying on your back to sitting on the side of a flat bed without using bedrails?: None Help needed moving to and from a bed to a chair (including a wheelchair)?: None Help needed standing up from a chair using your arms (e.g., wheelchair or bedside chair)?: None Help needed to walk in hospital room?: None Help needed climbing 3-5 steps with a railing? : None 6 Click Score: 24    End of Session   Activity Tolerance: Patient tolerated treatment well Patient left: in bed;with call bell/phone within reach Nurse  Communication: Mobility status PT Visit Diagnosis: Difficulty in walking, not elsewhere classified (R26.2)    Time: 4665-9935 PT Time Calculation (min) (ACUTE ONLY): 15 min   Charges:   PT Evaluation $PT Eval Low Complexity: [place] Pager [phone] Office [phone]   Claretha Cooper 09/05/2020, 2:50 PM

## 2020-09-06 DIAGNOSIS — T827XXD Infection and inflammatory reaction due to other cardiac and vascular devices, implants and grafts, subsequent encounter: Secondary | ICD-10-CM | POA: Diagnosis not present

## 2020-09-06 DIAGNOSIS — R7881 Bacteremia: Secondary | ICD-10-CM | POA: Diagnosis not present

## 2020-09-06 LAB — BASIC METABOLIC PANEL
Anion gap: 10 (ref 5–15)
BUN: 42 mg/dL — ABNORMAL HIGH (ref 6–20)
CO2: 19 mmol/L — ABNORMAL LOW (ref 22–32)
Calcium: 10.1 mg/dL (ref 8.9–10.3)
Chloride: 106 mmol/L (ref 98–111)
Creatinine, Ser: 1.77 mg/dL — ABNORMAL HIGH (ref 0.44–1.00)
GFR, Estimated: 31 mL/min — ABNORMAL LOW (ref >60)
Glucose, Bld: 106 mg/dL — ABNORMAL HIGH (ref 70–99)
Potassium: 5.3 mmol/L — ABNORMAL HIGH (ref 3.5–5.1)
Sodium: 135 mmol/L (ref 135–145)

## 2020-09-06 LAB — CULTURE, BLOOD (ROUTINE X 2)
Culture: NO GROWTH
Special Requests: ADEQUATE

## 2020-09-06 MED ORDER — LEVOFLOXACIN 500 MG PO TABS
750.0000 mg | ORAL_TABLET | ORAL | Status: DC
  Administered 2020-09-06: 750 mg via ORAL
  Filled 2020-09-06: qty 2

## 2020-09-06 MED ORDER — DEXTROSE-NACL 5-0.9 % IV SOLN: INTRAVENOUS | Status: AC

## 2020-09-06 MED ORDER — PROCHLORPERAZINE EDISYLATE 10 MG/2ML IJ SOLN
10.0000 mg | Freq: Four times a day (QID) | INTRAMUSCULAR | Status: AC | PRN
  Administered 2020-09-06: 10 mg via INTRAVENOUS
  Filled 2020-09-06: qty 2

## 2020-09-06 MED ORDER — TRAVASOL 10 % IV SOLN
INTRAVENOUS | Status: AC
  Filled 2020-09-06: qty 756

## 2020-09-06 MED ORDER — HYDRALAZINE HCL 20 MG/ML IJ SOLN: 5.0000 mg | INTRAMUSCULAR | Status: DC | PRN

## 2020-09-06 NOTE — Progress Notes (Signed)
PROGRESS NOTE    Caitlin Peters  SWN:[id number] DOB: 01-May-1961 DOA: 08/31/2020 PCP: Erline Hau, MD    Brief Narrative:  59 y.o. female with history of Crohn's disease, chronic kidney disease stage III, hypertension, chronic anemia thrombocytopenia followed by gastroenterologist at [facility] recently admitted for sepsis discharged home on August 12, 2020 about 20 days ago had come to the ER a day ago for fever at home and as per the patient patient recorded fever 102 F with some chills.  Patient has chronic abdominal pain and loose stools.  Denies any chest pain shortness of breath productive cough or vomiting.  In the ER patient had a CT abdomen pelvis which did not show anything acute LFTs are markedly elevated at that time.  Blood cultures obtained and patient was discharged home.  Blood cultures grew  Enterobacter cloacae complex,Enterobacterales,Stenotrophomonas maltophilia.  Patient was advised to come back to the ER.  Patient is on TPN and has a right femoral PICC line which could be the source.  Assessment & Plan:   Principal Problem:   Sepsis ([place]) Active Problems:   Crohn's colitis ([place])   Crohn disease ([place])   On total parenteral nutrition (TPN)   Abnormal LFTs   Stage 3b chronic kidney disease ([place])   Bacteremia associated with intravascular line ([name])  1. Bacteremia with sepsis present on admit 1. Presenting fever, tachycardia, leukopenia 2. Blood cx on 10/5 thus far pos for Stenotrophomonas maltophilia 3. Chart reviewed. 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 Pt with R femoral picc placed last month. Pt known to have very difficult access with limited sites 5. ID consulted regarding recurrent bacteremia. Recommendation to keep PICC in place 6. 2d echo ordered and results reviewed. Finding of normal LVEF. No mention of vegetation 7. Per ID, will need repeat peripheral blood culture at the end of treatment  course 8. Was continued on PO bactrim, however Cr is trending up 9. Pt seen today with ID at bedside. ID plan to transition to levofloxacin today 10. Patient endorses issues with n/v overnight, coughing up some pills. Will monitor for now to ensure pt can tolerate PO regimen 2. Abnormal LFTs are improving 1. Tolerating diet, denies RUQ pain 2. Repeat lft in AM 3. LFT's are improving 4. Repeat LFT's in AM 3. Acute on chronic kidney disease stage III 1. On IVF hydration 2. Renal function presented near baseline 3. Cr up to 1.77 today while on bactrim. See above. ID has changed to PO levaquin 4. Recheck bmet in AM 4. Chronic anemia and thrombocytopenia 1. Suspect secondary to presenting sepsis. 2. Plts up to 157 today 3. Recheck CBC in AM. Pt hemodynamically stable 5. Hypertension 1.  continue amlodipine beta-blockers and hydralazine 2. Currently suboptimally controlled. Pt did vomit up several pill last night 3. Will add PRN hydralazine 6. 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   1. Being followed at [facility]. 2. TPN was initially on hold 3. Now on regular diet but pt not tolerating secondary to abd discomfort 4. Abd is described as her typical chronic pain that has been present since her previous abd surgery 5. Presenting CT abd with findings of mild inflammation. Pt is reporting her current discomfort is close to her typical abd discomfort since her prior abd surgery 7. Chronic pain 1.  on methadone and presently on Dilaudid.  DVT prophylaxis: SCD's Code Status: Full Family Communication: Pt in room, family not at bedside  Status is: Inpatient  Remains inpatient appropriate because:Unsafe d/c plan, IV treatments appropriate due to intensity of illness or inability to take PO and Inpatient level of care appropriate due to severity of illness   Dispo: The patient is from: Home              Anticipated d/c is to: Home              Anticipated d/c date is: > 3  days              Patient currently is not medically stable to d/c.  Consultants:   ID  IR  Procedures:     Antimicrobials: Anti-infectives (From admission, onward)   Start     Dose/Rate Route Frequency Ordered Stop   09/06/20 1400  levofloxacin (LEVAQUIN) tablet 750 mg        750 mg Oral Every 48 hours 09/06/20 0912 09/15/20 2359   09/04/20 1400  sulfamethoxazole-trimethoprim (BACTRIM DS) 800-160 MG per tablet 2 tablet  Status:  Discontinued        2 tablet Oral Every 8 hours 09/04/20 1006 09/06/20 0734   09/04/20 1000  sulfamethoxazole-trimethoprim (BACTRIM DS) 800-160 MG per tablet 1 tablet  Status:  Discontinued        1 tablet Oral Every 12 hours 09/04/20 0945 09/04/20 1004   09/01/20 1600  sulfamethoxazole-trimethoprim (BACTRIM) 320 mg in dextrose 5 % 500 mL IVPB  Status:  Discontinued        320 mg 346.7 mL/hr over 90 Minutes Intravenous Every 8 hours 09/01/20 0819 09/04/20 0945   09/01/20 0000  sulfamethoxazole-trimethoprim (BACTRIM) 317.44 mg in dextrose 5 % 500 mL IVPB  Status:  Discontinued        15 mg/kg/day  63.5 kg 346.6 mL/hr over 90 Minutes Intravenous Every 8 hours 08/31/20 2356 09/01/20 0819      Subjective: Some abd discomfort this AM. Reportedly coughed up several pills overnight  Objective: Vitals:   09/05/20 2131 09/06/20 0544 09/06/20 0827 09/06/20 1353  BP: (!) 156/73 (!) 149/129 (!) 177/106 (!) 169/68  Pulse: 60 69 69 60  Resp: 14 18  18   Temp: 98.4 F (36.9 C) 98.9 F (37.2 C)  98.4 F (36.9 C)  TempSrc: Oral Oral  Oral  SpO2: 98% 100%  100%  Weight:      Height:        Intake/Output Summary (Last 24 hours) at 09/06/2020 1432 Last data filed at 09/06/2020 1400 Gross per 24 hour  Intake 600 ml  Output 2800 ml  Net -2200 ml   Filed Weights   09/01/20 0510  Weight: 63.5 kg    Examination: General exam: Awake, laying in bed, in nad Respiratory system: Normal respiratory effort, no wheezing Cardiovascular system: regular rate,  s1, s2 Gastrointestinal system: Soft, nondistended, positive BS Central nervous system: CN2-12 grossly intact, strength intact Extremities: Perfused, no clubbing Skin: Normal skin turgor, no notable skin lesions seen Psychiatry: Mood normal // no visual hallucinations   Data Reviewed: I have personally reviewed following labs and imaging studies  CBC: Recent Labs  Lab 08/30/20 2249 08/30/20 2249 08/31/20 2335 09/01/20 0558 09/02/20 0324 09/03/20 0350 09/05/20 0338  WBC 1.7*   < > 6.3 4.7 3.6* 2.7* 3.5*  NEUTROABS 1.5*  --  4.8  --  2.3  --  1.7  HGB 10.2*   < > 8.0* 7.8* 7.4* 8.0* 8.8*  HCT 30.7*   < > 24.3* 25.0* 22.9* 24.3* 26.4*  MCV 91.4   < >  93.5 96.9 92.0 90.0 90.7  PLT 125*   < > 119* 94* 125* 138* 157   < > = values in this interval not displayed.   Basic Metabolic Panel: Recent Labs  Lab 09/01/20 0558 09/01/20 0558 09/02/20 0324 09/03/20 0350 09/04/20 0416 09/05/20 0338 09/06/20 0440  NA 136   < > 135 139 135 134* 135  K 3.6   < > 3.8 3.5 4.4 4.6 5.3*  CL 103   < > 105 107 105 105 106  CO2 22   < > 24 23 22  21* 19*  GLUCOSE 97   < > 87 94 100* 101* 106*  BUN 27*   < > 16 11 22* 29* 42*  CREATININE 1.58*   < > 1.54* 1.47* 1.53* 1.65* 1.77*  CALCIUM 9.3   < > 8.8* 9.5 9.7 9.6 10.1  MG 1.7  --  1.6*  --  2.1 2.0  --   PHOS 2.9  --  2.6  --  3.3 3.9  --    < > = values in this interval not displayed.   GFR: Estimated Creatinine Clearance: 34.3 mL/min (A) (by C-G formula based on SCr of 1.77 mg/dL (H)). Liver Function Tests: Recent Labs  Lab 09/01/20 0558 09/02/20 0324 09/03/20 0350 09/04/20 0416 09/05/20 0338  AST 259* 113* 56* 37 24  ALT 458* 316* 244* 185* 134*  ALKPHOS 211* 190* 203* 206* 193*  BILITOT 1.0 0.7 0.8 0.7 0.5  PROT 6.5 6.0* 6.6 6.8 6.8  ALBUMIN 2.8* 2.8* 3.0* 2.9* 3.0*   No results for input(s): LIPASE, AMYLASE in the last 168 hours. No results for input(s): AMMONIA in the last 168 hours. Coagulation Profile: Recent Labs  Lab  08/31/20 2335  INR 1.0   Cardiac Enzymes: No results for input(s): CKTOTAL, CKMB, CKMBINDEX, TROPONINI in the last 168 hours. BNP (last 3 results) No results for input(s): PROBNP in the last 8760 hours. HbA1C: No results for input(s): HGBA1C in the last 72 hours. CBG: Recent Labs  Lab 09/04/20 0742 09/04/20 1411 09/04/20 1959 09/05/20 0225 09/05/20 0745  GLUCAP 109* 87 113* 110* 108*   Lipid Profile: Recent Labs    09/05/20 0338  TRIG 54   Thyroid Function Tests: No results for input(s): TSH, T4TOTAL, FREET4, T3FREE, THYROIDAB in the last 72 hours. Anemia Panel: No results for input(s): VITAMINB12, FOLATE, FERRITIN, TIBC, IRON, RETICCTPCT in the last 72 hours. Sepsis Labs: Recent Labs  Lab 08/30/20 2315 08/31/20 2335  LATICACIDVEN 1.2 0.5    Recent Results (from the past 240 hour(s))  Blood culture (routine x 2)     Status: Abnormal   Collection Time: 08/30/20 10:50 PM   Specimen: BLOOD  Result Value Ref Range Status   Specimen Description   Final    BLOOD CENTRAL LINE Performed at [facility], [place] [address], [place], [place] [address]    Special Requests   Final    BOTTLES DRAWN AEROBIC AND ANAEROBIC Blood Culture adequate volume Performed at [facility] [address], [place], [place] [address]    Culture  Setup Time   Final    GRAM NEGATIVE RODS AEROBIC BOTTLE ONLY CRITICAL RESULT CALLED TO, READ BACK BY AND VERIFIED WITH: L ADKINS RN 08/31/20 2114 JDW Performed at [facility], [place] [address] [place],  [address]    Culture STENOTROPHOMONAS MALTOPHILIA (A)  Final   Report Status 09/02/2020 FINAL  Final   Organism ID, Bacteria STENOTROPHOMONAS MALTOPHILIA  Final      Susceptibility   Stenotrophomonas maltophilia - MIC*    LEVOFLOXACIN 0.5 SENSITIVE Sensitive     TRIMETH/SULFA <=20 SENSITIVE Sensitive     * STENOTROPHOMONAS MALTOPHILIA  Blood Culture ID Panel (Reflexed)     Status: Abnormal    Collection Time: 08/30/20 10:50 PM  Result Value Ref Range Status   Enterococcus faecalis NOT DETECTED NOT DETECTED Final   Enterococcus Faecium NOT DETECTED NOT DETECTED Final   Listeria monocytogenes NOT DETECTED NOT DETECTED Final   Staphylococcus species NOT DETECTED NOT DETECTED Final   Staphylococcus aureus (BCID) NOT DETECTED NOT DETECTED Final   Staphylococcus epidermidis NOT DETECTED NOT DETECTED Final   Staphylococcus lugdunensis NOT DETECTED NOT DETECTED Final   Streptococcus species NOT DETECTED NOT DETECTED Final   Streptococcus agalactiae NOT DETECTED NOT DETECTED Final   Streptococcus pneumoniae NOT DETECTED NOT DETECTED Final   Streptococcus pyogenes NOT DETECTED NOT DETECTED Final   A.calcoaceticus-baumannii NOT DETECTED NOT DETECTED Final   Bacteroides fragilis NOT DETECTED NOT DETECTED Final   Enterobacterales NOT DETECTED NOT DETECTED Final   Enterobacter cloacae complex NOT DETECTED NOT DETECTED Final   Escherichia coli NOT DETECTED NOT DETECTED Final   Klebsiella aerogenes NOT DETECTED NOT DETECTED Final   Klebsiella oxytoca NOT DETECTED NOT DETECTED Final   Klebsiella pneumoniae NOT DETECTED NOT DETECTED Final   Proteus species NOT DETECTED NOT DETECTED Final   Salmonella species NOT DETECTED NOT DETECTED Final   Serratia marcescens NOT DETECTED NOT DETECTED Final   Haemophilus influenzae NOT DETECTED NOT DETECTED Final   Neisseria meningitidis NOT DETECTED NOT DETECTED Final   Pseudomonas aeruginosa NOT DETECTED NOT DETECTED Final   Stenotrophomonas maltophilia DETECTED (A) NOT DETECTED Final    Comment: CRITICAL RESULT CALLED TO, READ BACK BY AND VERIFIED WITH: L ADKINS RN 08/31/20 2114 JDW    Candida albicans NOT DETECTED NOT DETECTED Final   Candida auris NOT DETECTED NOT DETECTED Final   Candida glabrata NOT DETECTED NOT DETECTED Final   Candida krusei NOT DETECTED NOT DETECTED Final   Candida parapsilosis NOT DETECTED NOT DETECTED Final   Candida  tropicalis NOT DETECTED NOT DETECTED Final   Cryptococcus neoformans/gattii NOT DETECTED NOT DETECTED Final    Comment: Performed at [facility], [address], [place], [place] [address]  Respiratory Panel by RT PCR (Flu A&B, Covid) - Nasopharyngeal Swab     Status: None   Collection Time: 08/31/20 12:03 AM   Specimen: Nasopharyngeal Swab  Result Value Ref Range Status   SARS Coronavirus 2 by RT PCR NEGATIVE NEGATIVE Final    Comment: (NOTE) SARS-CoV-2 target nucleic acids are NOT DETECTED.  The SARS-CoV-2 RNA is generally detectable in upper respiratoy specimens during the acute phase of infection. The lowest concentration of SARS-CoV-2 viral copies this assay can detect is 131 copies/mL. A negative result does not preclude SARS-Cov-2 infection and should not be used as the sole basis for treatment or other patient management decisions. A negative result may occur with  improper specimen collection/handling, submission of specimen other than nasopharyngeal swab, presence of viral mutation(s) within the areas targeted by this assay, and inadequate number of viral copies (<131 copies/mL). A negative result must be combined with clinical observations, patient history, and epidemiological information. The expected result is Negative.  Fact Sheet for Patients:  PinkCheek.be  Fact Sheet for Healthcare Providers:  GravelBags.it  This test is no t yet approved or cleared by the [place] FDA and  has been  authorized for detection and/or diagnosis of SARS-CoV-2 by FDA under an Emergency Use Authorization (EUA). This EUA will remain  in effect (meaning this test can be used) for the duration of the COVID-19 declaration under Section 564(b)(1) of the Act, 21 U.S.C. section 360bbb-3(b)(1), unless the authorization is terminated or revoked sooner.     Influenza A by PCR NEGATIVE NEGATIVE Final   Influenza B by PCR  NEGATIVE NEGATIVE Final    Comment: (NOTE) The Xpert Xpress SARS-CoV-2/FLU/RSV assay is intended as an aid in  the diagnosis of influenza from Nasopharyngeal swab specimens and  should not be used as a sole basis for treatment. Nasal washings and  aspirates are unacceptable for Xpert Xpress SARS-CoV-2/FLU/RSV  testing.  Fact Sheet for Patients: PinkCheek.be  Fact Sheet for Healthcare Providers: GravelBags.it  This test is not yet approved or cleared by the [place] FDA and  has been authorized for detection and/or diagnosis of SARS-CoV-2 by  FDA under an Emergency Use Authorization (EUA). This EUA will remain  in effect (meaning this test can be used) for the duration of the  Covid-19 declaration under Section 564(b)(1) of the Act, 21  U.S.C. section 360bbb-3(b)(1), unless the authorization is  terminated or revoked. Performed at [facility], [place] [address], [place], [place] [address]   Urine culture     Status: Abnormal   Collection Time: 08/31/20  3:30 AM   Specimen: In/Out Cath Urine  Result Value Ref Range Status   Specimen Description   Final    IN/OUT CATH URINE Performed at [facility] [address], [place], [place] [address]    Special Requests   Final    NONE Performed at [facility], [place] [address], [place], [place] [address]    Culture (A)  Final    100 COLONIES/mL STAPHYLOCOCCUS EPIDERMIDIS 100 COLONIES/mL STAPHYLOCOCCUS WARNERI    Report Status 09/02/2020 FINAL  Final   Organism ID, Bacteria STAPHYLOCOCCUS EPIDERMIDIS (A)  Final   Organism ID, Bacteria STAPHYLOCOCCUS WARNERI (A)  Final      Susceptibility   Staphylococcus epidermidis - MIC*    CIPROFLOXACIN >=8 RESISTANT Resistant     GENTAMICIN 8 INTERMEDIATE Intermediate     NITROFURANTOIN <=16 SENSITIVE Sensitive     OXACILLIN >=4 RESISTANT Resistant     TETRACYCLINE <=1  SENSITIVE Sensitive     VANCOMYCIN 2 SENSITIVE Sensitive     TRIMETH/SULFA 80 RESISTANT Resistant     CLINDAMYCIN >=8 RESISTANT Resistant     RIFAMPIN <=0.5 SENSITIVE Sensitive     Inducible Clindamycin NEGATIVE Sensitive     * 100 COLONIES/mL STAPHYLOCOCCUS EPIDERMIDIS   Staphylococcus warneri - MIC*    CIPROFLOXACIN <=0.5 SENSITIVE Sensitive     GENTAMICIN <=0.5 SENSITIVE Sensitive     NITROFURANTOIN 32 SENSITIVE Sensitive     OXACILLIN <=0.25 SENSITIVE Sensitive     TETRACYCLINE <=1 SENSITIVE Sensitive     VANCOMYCIN 1 SENSITIVE Sensitive     TRIMETH/SULFA <=10 SENSITIVE Sensitive     CLINDAMYCIN >=8 RESISTANT Resistant     RIFAMPIN <=0.5 SENSITIVE Sensitive     Inducible Clindamycin NEGATIVE Sensitive     * 100 COLONIES/mL STAPHYLOCOCCUS WARNERI  Blood Culture (routine x 2)     Status: Abnormal (Preliminary result)   Collection Time: 08/31/20 11:35 PM   Specimen: BLOOD  Result Value Ref Range Status   Specimen Description   Final    BLOOD PICC LINE Performed at [facility], [place]  [address] [place], [place] [address]    Special Requests   Final    BOTTLES DRAWN AEROBIC AND ANAEROBIC Blood Culture adequate volume Performed at [facility] [address] [place], [place] [address]    Culture  Setup Time   Final    GRAM NEGATIVE RODS AEROBIC BOTTLE ONLY CRITICAL VALUE NOTED.  VALUE IS CONSISTENT WITH PREVIOUSLY REPORTED AND CALLED VALUE. GRAM POSITIVE RODS ANAEROBIC BOTTLE ONLY CRITICAL RESULT CALLED TO, READ BACK BY AND VERIFIED WITH: [place] Silos [id number] 09/06/20 FCP Performed at [name], [place] [address], [place], [place] [address]    Culture (A)  Final    STENOTROPHOMONAS MALTOPHILIA SUSCEPTIBILITIES PERFORMED ON PREVIOUS CULTURE WITHIN THE LAST 5 DAYS. GRAM POSITIVE RODS    Report Status PENDING  Incomplete  Respiratory Panel by RT PCR (Flu A&B, Covid) - Nasopharyngeal Swab     Status: None   Collection Time:  09/01/20  3:34 AM   Specimen: Nasopharyngeal Swab  Result Value Ref Range Status   SARS Coronavirus 2 by RT PCR NEGATIVE NEGATIVE Final    Comment: (NOTE) SARS-CoV-2 target nucleic acids are NOT DETECTED.  The SARS-CoV-2 RNA is generally detectable in upper respiratoy specimens during the acute phase of infection. The lowest concentration of SARS-CoV-2 viral copies this assay can detect is 131 copies/mL. A negative result does not preclude SARS-Cov-2 infection and should not be used as the sole basis for treatment or other patient management decisions. A negative result may occur with  improper specimen collection/handling, submission of specimen other than nasopharyngeal swab, presence of viral mutation(s) within the areas targeted by this assay, and inadequate number of viral copies (<131 copies/mL). A negative result must be combined with clinical observations, patient history, and epidemiological information. The expected result is Negative.  Fact Sheet for Patients:  PinkCheek.be  Fact Sheet for Healthcare Providers:  GravelBags.it  This test is no t yet approved or cleared by the [place] FDA and  has been authorized for detection and/or diagnosis of SARS-CoV-2 by FDA under an Emergency Use Authorization (EUA). This EUA will remain  in effect (meaning this test can be used) for the duration of the COVID-19 declaration under Section 564(b)(1) of the Act, 21 U.S.C. section 360bbb-3(b)(1), unless the authorization is terminated or revoked sooner.     Influenza A by PCR NEGATIVE NEGATIVE Final   Influenza B by PCR NEGATIVE NEGATIVE Final    Comment: (NOTE) The Xpert Xpress SARS-CoV-2/FLU/RSV assay is intended as an aid in  the diagnosis of influenza from Nasopharyngeal swab specimens and  should not be used as a sole basis for treatment. Nasal washings and  aspirates are unacceptable for Xpert Xpress  SARS-CoV-2/FLU/RSV  testing.  Fact Sheet for Patients: PinkCheek.be  Fact Sheet for Healthcare Providers: GravelBags.it  This test is not yet approved or cleared by the [place] FDA and  has been authorized for detection and/or diagnosis of SARS-CoV-2 by  FDA under an Emergency Use Authorization (EUA). This EUA will remain  in effect (meaning this test can be used) for the duration of the  Covid-19 declaration under Section 564(b)(1) of the Act, 21  U.S.C. section 360bbb-3(b)(1), unless the authorization is  terminated or revoked. Performed at [facility], [place] [address], [place], [place] [address]   Blood Culture (routine x 2)     Status: None   Collection Time: 09/01/20  5:58 AM   Specimen: BLOOD LEFT HAND  Result Value Ref Range Status  Specimen Description   Final    BLOOD LEFT HAND Performed at [facility] [address], [place], [place] [address]    Special Requests   Final    BOTTLES DRAWN AEROBIC ONLY Blood Culture adequate volume Performed at [facility] [address], [place], [place] [address]    Culture   Final    NO GROWTH 5 DAYS Performed at [facility], [place] [address] [place],  [address]    Report Status 09/06/2020 FINAL  Final     Radiology Studies: No results found.  Scheduled Meds: . (feeding supplement) PROSource Plus  30 mL Oral BID BM  . amLODipine  10 mg Oral Daily  . budesonide  3 mg Oral Daily  . buPROPion  200 mg Oral Daily  . Chlorhexidine Gluconate Cloth  6 each Topical Daily  . cholecalciferol  1,000 Units Oral Daily  . cycloSPORINE  1 drop Both Eyes BID  . dicyclomine  20 mg Oral TID  . DULoxetine  90 mg Oral Daily  . estradiol  2 mg Oral Daily  . famotidine  20 mg Oral QHS  . feeding supplement ([employer] STANDARD 1.4)  325 mL Oral Daily  . hydrALAZINE  50 mg Oral TID  . levofloxacin  750 mg  Oral Q48H  . metoprolol tartrate  100 mg Oral BID  . multivitamin  15 mL Oral Daily  . omega-3 acid ethyl esters  1,000 mg Oral Daily  . pantoprazole  40 mg Oral Daily  . polyvinyl alcohol  1 drop Both Eyes QID  . senna-docusate  1 tablet Oral BID  . sodium chloride flush  10-40 mL Intracatheter Q12H  . sucralfate  1 g Oral TID WC & HS  . vitamin B-12  1,000 mcg Oral Daily   Continuous Infusions: . TPN CYCLIC-ADULT (ION)       LOS: 5 days   Marylu Lund, MD [facility] Pager On Amion  If 7PM-7AM, please contact night-coverage 09/06/2020, 2:32 PM

## 2020-09-06 NOTE — Progress Notes (Signed)
PHARMACY - PHYSICIAN COMMUNICATION CRITICAL VALUE ALERT - BLOOD CULTURE IDENTIFICATION (BCID)  Caitlin Peters is an 59 y.o. female who presented to [facility] on 08/31/2020 with a chief complaint of fever  Assessment: lab called to report 1/4 bottles from 10/6 PICC line, anaerobic bottle only> gram positive rods  Name of physician (or Provider) Contacted: Dr. Gale Journey  Current antibiotics: Levaquin for stenotrophomonas bacteremia  Changes to prescribed antibiotics recommended:  No changes needed Continue Levaquin Gram positive rod probable contaminant  Results for orders placed or performed during the hospital encounter of 08/30/20  Blood Culture ID Panel (Reflexed) (Collected: 08/30/2020 10:50 PM)  Result Value Ref Range   Enterococcus faecalis NOT DETECTED NOT DETECTED   Enterococcus Faecium NOT DETECTED NOT DETECTED   Listeria monocytogenes NOT DETECTED NOT DETECTED   Staphylococcus species NOT DETECTED NOT DETECTED   Staphylococcus aureus (BCID) NOT DETECTED NOT DETECTED   Staphylococcus epidermidis NOT DETECTED NOT DETECTED   Staphylococcus lugdunensis NOT DETECTED NOT DETECTED   Streptococcus species NOT DETECTED NOT DETECTED   Streptococcus agalactiae NOT DETECTED NOT DETECTED   Streptococcus pneumoniae NOT DETECTED NOT DETECTED   Streptococcus pyogenes NOT DETECTED NOT DETECTED   A.calcoaceticus-baumannii NOT DETECTED NOT DETECTED   Bacteroides fragilis NOT DETECTED NOT DETECTED   Enterobacterales NOT DETECTED NOT DETECTED   Enterobacter cloacae complex NOT DETECTED NOT DETECTED   Escherichia coli NOT DETECTED NOT DETECTED   Klebsiella aerogenes NOT DETECTED NOT DETECTED   Klebsiella oxytoca NOT DETECTED NOT DETECTED   Klebsiella pneumoniae NOT DETECTED NOT DETECTED   Proteus species NOT DETECTED NOT DETECTED   Salmonella species NOT DETECTED NOT DETECTED   Serratia marcescens NOT DETECTED NOT DETECTED   Haemophilus influenzae NOT DETECTED NOT DETECTED   Neisseria  meningitidis NOT DETECTED NOT DETECTED   Pseudomonas aeruginosa NOT DETECTED NOT DETECTED   Stenotrophomonas maltophilia DETECTED (A) NOT DETECTED   Candida albicans NOT DETECTED NOT DETECTED   Candida auris NOT DETECTED NOT DETECTED   Candida glabrata NOT DETECTED NOT DETECTED   Candida krusei NOT DETECTED NOT DETECTED   Candida parapsilosis NOT DETECTED NOT DETECTED   Candida tropicalis NOT DETECTED NOT DETECTED   Cryptococcus neoformans/gattii NOT DETECTED NOT DETECTED   Eudelia Bunch, Pharm.D 09/06/2020 10:51 AM

## 2020-09-06 NOTE — Progress Notes (Signed)
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  Repeat bcx out of peripheral is ngtd on 10/07  tte no evidence valvular vegetation  She is tolerating bactrim high dose although her creatinine is slowly rising   Of note ucx with several CoNS species which are not pathogenic in her cases and represent assyptomatic bacteriuria  -------- Creatinine rising on high dose trim-sulfa Clinically stable/improving Repeat bcx 10/07 remained negative    - stop bactrim; start levofloxacin 750 mg po daily to finish the 2 week course duration on 10/21 - maintain line for now unless recurrent episodes or fever (then will need simultaneous peripheral and picc blood culture) - patient has ID clinic f/u with Dr Tommy Medal on 10/25 @ 245 for planned repeat surveillant blood cx off abx and general ID f/u    Principal Problem:   Sepsis ([place]) Active Problems:   Crohn's colitis ([place])   Crohn disease ([place])   On total parenteral nutrition (TPN)   Abnormal LFTs   Stage 3b chronic kidney disease ([place])   Bacteremia associated with intravascular line ([place])   Scheduled Meds: . (feeding supplement) PROSource Plus  30 mL Oral BID BM  . amLODipine  10 mg Oral Daily  . budesonide  3 mg Oral Daily  . buPROPion  200 mg Oral Daily  . Chlorhexidine Gluconate Cloth  6 each Topical Daily  . cholecalciferol  1,000 Units Oral Daily  . cycloSPORINE  1 drop Both Eyes BID  . dicyclomine  20 mg Oral TID  . DULoxetine  90 mg Oral Daily  . estradiol  2 mg Oral Daily  . famotidine   20 mg Oral QHS  . feeding supplement (KATE FARMS STANDARD 1.4)  325 mL Oral Daily  . hydrALAZINE  50 mg Oral TID  . levofloxacin  750 mg Oral Q48H  . metoprolol tartrate  100 mg Oral BID  . multivitamin  15 mL Oral Daily  . omega-3 acid ethyl esters  1,000 mg Oral Daily  . pantoprazole  40 mg Oral Daily  . polyvinyl alcohol  1 drop Both Eyes QID  . senna-docusate  1 tablet Oral BID  . sodium chloride flush  10-40 mL Intracatheter Q12H  . sucralfate  1 g Oral TID WC & HS  . vitamin B-12  1,000 mcg Oral Daily   Continuous Infusions: . TPN CYCLIC-ADULT (ION)     PRN Meds:.calcium carbonate, diphenoxylate-atropine, HYDROmorphone (DILAUDID) injection, lipase/protease/amylase, loperamide, ondansetron (ZOFRAN) IV, prochlorperazine, promethazine, sodium chloride flush, traZODone   SUBJECTIVE: Continues to feel well No n/v/f/c No rash Cr rising to 1.8 today on bactrim ds 2tab tid  Review of Systems: ROS Negative 11 point ros unless mentioned above  Allergies  Allergen Reactions  . Cefepime Other (See Comments)    Neurotoxicity occurring in setting of AKI. Ceftriaxone tolerated during same admit  . Gabapentin Other (See Comments)    unknown  . Hyoscyamine Hives and Swelling    Legs swelling   Disorientation  . Lyrica [Pregabalin] Other (See Comments)  unknown  . Meperidine Hives    Other reaction(s): GI Upset Due to [place]   . Topamax [Topiramate] Other (See Comments)    unknown  . Zosyn [Piperacillin Sod-Tazobactam So]     Patient reports it makes her vomit, her neck stiff, and her "heart feel funny"  . Fentanyl Rash    Pt is allergic to fentanyl patch related to the glue (gives her a rash) Pt states she is NOT allergic to fentanyl IV medicine  . Morphine And Related Rash    OBJECTIVE: Vitals:   09/05/20 1447 09/05/20 2131 09/06/20 0544 09/06/20 0827  BP: (!) 145/96 (!) 156/73 (!) 149/129 (!) 177/106  Pulse: 64 60 69 69  Resp: 18 14 18    Temp: 99 F (37.2 C)  98.4 F (36.9 C) 98.9 F (37.2 C)   TempSrc: Oral Oral Oral   SpO2: 96% 98% 100%   Weight:      Height:       Body mass index is 21.29 kg/m.  Physical Exam No distress, pleasant Psych alert/oriented heent atraumatic; per; conj clear cv rrr no mrg Lungs clear abd s/nt Ext no edema Skin no rash; Right femoral picc site no tenderness/purulence Neuro nonfocal  Lab Results Lab Results  Component Value Date   WBC 3.5 (L) 09/05/2020   HGB 8.8 (L) 09/05/2020   HCT 26.4 (L) 09/05/2020   MCV 90.7 09/05/2020   PLT 157 09/05/2020    Lab Results  Component Value Date   CREATININE 1.77 (H) 09/06/2020   BUN 42 (H) 09/06/2020   NA 135 09/06/2020   K 5.3 (H) 09/06/2020   CL 106 09/06/2020   CO2 19 (L) 09/06/2020    Lab Results  Component Value Date   ALT 134 (H) 09/05/2020   AST 24 09/05/2020   ALKPHOS 193 (H) 09/05/2020   BILITOT 0.5 09/05/2020     Microbiology: Recent Results (from the past 240 hour(s))  Blood culture (routine x 2)     Status: Abnormal   Collection Time: 08/30/20 10:50 PM   Specimen: BLOOD  Result Value Ref Range Status   Specimen Description   Final    BLOOD CENTRAL LINE Performed at [facility], [place] [address], [place], [place] [address]    Special Requests   Final    BOTTLES DRAWN AEROBIC AND ANAEROBIC Blood Culture adequate volume Performed at [facility] [address], [place], [place] [address]    Culture  Setup Time   Final    GRAM NEGATIVE RODS AEROBIC BOTTLE ONLY CRITICAL RESULT CALLED TO, READ BACK BY AND VERIFIED WITH: L ADKINS RN 08/31/20 2114 JDW Performed at [facility], [place] [address], [place], [place] [address]    Culture STENOTROPHOMONAS MALTOPHILIA (A)  Final   Report Status 09/02/2020 FINAL  Final   Organism ID, Bacteria STENOTROPHOMONAS MALTOPHILIA  Final      Susceptibility   Stenotrophomonas maltophilia - MIC*    LEVOFLOXACIN 0.5 SENSITIVE Sensitive     TRIMETH/SULFA <=20  SENSITIVE Sensitive     * STENOTROPHOMONAS MALTOPHILIA  Blood Culture ID Panel (Reflexed)     Status: Abnormal   Collection Time: 08/30/20 10:50 PM  Result Value Ref Range Status   Enterococcus faecalis NOT DETECTED NOT DETECTED Final   Enterococcus Faecium NOT DETECTED NOT DETECTED Final   Listeria monocytogenes NOT DETECTED NOT DETECTED Final   Staphylococcus species NOT DETECTED NOT DETECTED Final   Staphylococcus aureus (BCID) NOT DETECTED NOT DETECTED Final  Staphylococcus epidermidis NOT DETECTED NOT DETECTED Final   Staphylococcus lugdunensis NOT DETECTED NOT DETECTED Final   Streptococcus species NOT DETECTED NOT DETECTED Final   Streptococcus agalactiae NOT DETECTED NOT DETECTED Final   Streptococcus pneumoniae NOT DETECTED NOT DETECTED Final   Streptococcus pyogenes NOT DETECTED NOT DETECTED Final   A.calcoaceticus-baumannii NOT DETECTED NOT DETECTED Final   Bacteroides fragilis NOT DETECTED NOT DETECTED Final   Enterobacterales NOT DETECTED NOT DETECTED Final   Enterobacter cloacae complex NOT DETECTED NOT DETECTED Final   Escherichia coli NOT DETECTED NOT DETECTED Final   Klebsiella aerogenes NOT DETECTED NOT DETECTED Final   Klebsiella oxytoca NOT DETECTED NOT DETECTED Final   Klebsiella pneumoniae NOT DETECTED NOT DETECTED Final   Proteus species NOT DETECTED NOT DETECTED Final   Salmonella species NOT DETECTED NOT DETECTED Final   Serratia marcescens NOT DETECTED NOT DETECTED Final   Haemophilus influenzae NOT DETECTED NOT DETECTED Final   Neisseria meningitidis NOT DETECTED NOT DETECTED Final   Pseudomonas aeruginosa NOT DETECTED NOT DETECTED Final   Stenotrophomonas maltophilia DETECTED (A) NOT DETECTED Final    Comment: CRITICAL RESULT CALLED TO, READ BACK BY AND VERIFIED WITH: L ADKINS RN 08/31/20 2114 JDW    Candida albicans NOT DETECTED NOT DETECTED Final   Candida auris NOT DETECTED NOT DETECTED Final   Candida glabrata NOT DETECTED NOT DETECTED Final    Candida krusei NOT DETECTED NOT DETECTED Final   Candida parapsilosis NOT DETECTED NOT DETECTED Final   Candida tropicalis NOT DETECTED NOT DETECTED Final   Cryptococcus neoformans/gattii NOT DETECTED NOT DETECTED Final    Comment: Performed at [facility], [address], [place], [place] [address]  Respiratory Panel by RT PCR (Flu A&B, Covid) - Nasopharyngeal Swab     Status: None   Collection Time: 08/31/20 12:03 AM   Specimen: Nasopharyngeal Swab  Result Value Ref Range Status   SARS Coronavirus 2 by RT PCR NEGATIVE NEGATIVE Final    Comment: (NOTE) SARS-CoV-2 target nucleic acids are NOT DETECTED.  The SARS-CoV-2 RNA is generally detectable in upper respiratoy specimens during the acute phase of infection. The lowest concentration of SARS-CoV-2 viral copies this assay can detect is 131 copies/mL. A negative result does not preclude SARS-Cov-2 infection and should not be used as the sole basis for treatment or other patient management decisions. A negative result may occur with  improper specimen collection/handling, submission of specimen other than nasopharyngeal swab, presence of viral mutation(s) within the areas targeted by this assay, and inadequate number of viral copies (<131 copies/mL). A negative result must be combined with clinical observations, patient history, and epidemiological information. The expected result is Negative.  Fact Sheet for Patients:  PinkCheek.be  Fact Sheet for Healthcare Providers:  GravelBags.it  This test is no t yet approved or cleared by the [place] FDA and  has been authorized for detection and/or diagnosis of SARS-CoV-2 by FDA under an Emergency Use Authorization (EUA). This EUA will remain  in effect (meaning this test can be used) for the duration of the COVID-19 declaration under Section 564(b)(1) of the Act, 21 U.S.C. section 360bbb-3(b)(1), unless the  authorization is terminated or revoked sooner.     Influenza A by PCR NEGATIVE NEGATIVE Final   Influenza B by PCR NEGATIVE NEGATIVE Final    Comment: (NOTE) The Xpert Xpress SARS-CoV-2/FLU/RSV assay is intended as an aid in  the diagnosis of influenza from Nasopharyngeal swab specimens and  should not be used as a sole basis  for treatment. Nasal washings and  aspirates are unacceptable for Xpert Xpress SARS-CoV-2/FLU/RSV  testing.  Fact Sheet for Patients: PinkCheek.be  Fact Sheet for Healthcare Providers: GravelBags.it  This test is not yet approved or cleared by the [place] FDA and  has been authorized for detection and/or diagnosis of SARS-CoV-2 by  FDA under an Emergency Use Authorization (EUA). This EUA will remain  in effect (meaning this test can be used) for the duration of the  Covid-19 declaration under Section 564(b)(1) of the Act, 21  U.S.C. section 360bbb-3(b)(1), unless the authorization is  terminated or revoked. Performed at [facility], [place] [address], [place], [place] [address]   Urine culture     Status: Abnormal   Collection Time: 08/31/20  3:30 AM   Specimen: In/Out Cath Urine  Result Value Ref Range Status   Specimen Description   Final    IN/OUT CATH URINE Performed at [facility] [address] [place], [place] [address]    Special Requests   Final    NONE Performed at [facility], [place] [address] [place], [place] [address]    Culture (A)  Final    100 COLONIES/mL STAPHYLOCOCCUS EPIDERMIDIS 100 COLONIES/mL STAPHYLOCOCCUS WARNERI    Report Status 09/02/2020 FINAL  Final   Organism ID, Bacteria STAPHYLOCOCCUS EPIDERMIDIS (A)  Final   Organism ID, Bacteria STAPHYLOCOCCUS WARNERI (A)  Final      Susceptibility   Staphylococcus epidermidis - MIC*    CIPROFLOXACIN >=8 RESISTANT Resistant     GENTAMICIN 8 INTERMEDIATE  Intermediate     NITROFURANTOIN <=16 SENSITIVE Sensitive     OXACILLIN >=4 RESISTANT Resistant     TETRACYCLINE <=1 SENSITIVE Sensitive     VANCOMYCIN 2 SENSITIVE Sensitive     TRIMETH/SULFA 80 RESISTANT Resistant     CLINDAMYCIN >=8 RESISTANT Resistant     RIFAMPIN <=0.5 SENSITIVE Sensitive     Inducible Clindamycin NEGATIVE Sensitive     * 100 COLONIES/mL STAPHYLOCOCCUS EPIDERMIDIS   Staphylococcus warneri - MIC*    CIPROFLOXACIN <=0.5 SENSITIVE Sensitive     GENTAMICIN <=0.5 SENSITIVE Sensitive     NITROFURANTOIN 32 SENSITIVE Sensitive     OXACILLIN <=0.25 SENSITIVE Sensitive     TETRACYCLINE <=1 SENSITIVE Sensitive     VANCOMYCIN 1 SENSITIVE Sensitive     TRIMETH/SULFA <=10 SENSITIVE Sensitive     CLINDAMYCIN >=8 RESISTANT Resistant     RIFAMPIN <=0.5 SENSITIVE Sensitive     Inducible Clindamycin NEGATIVE Sensitive     * 100 COLONIES/mL STAPHYLOCOCCUS WARNERI  Blood Culture (routine x 2)     Status: Abnormal (Preliminary result)   Collection Time: 08/31/20 11:35 PM   Specimen: BLOOD  Result Value Ref Range Status   Specimen Description   Final    BLOOD PICC LINE Performed at [facility], [place] [address] [place], [place] [address]    Special Requests   Final    BOTTLES DRAWN AEROBIC AND ANAEROBIC Blood Culture adequate volume Performed at [facility] [address] [place], [place] [address]    Culture  Setup Time   Final    GRAM NEGATIVE RODS AEROBIC BOTTLE ONLY CRITICAL VALUE NOTED.  VALUE IS CONSISTENT WITH PREVIOUSLY REPORTED AND CALLED VALUE. GRAM POSITIVE RODS ANAEROBIC BOTTLE ONLY CRITICAL RESULT CALLED TO, READ BACK BY AND VERIFIED WITH: [place] Silos [id number] 09/06/20 FCP Performed at [facility], [place] [address], [place], [place] [address]    Culture (A)  Final    STENOTROPHOMONAS MALTOPHILIA SUSCEPTIBILITIES PERFORMED ON PREVIOUS CULTURE WITHIN THE LAST 5 DAYS. GRAM POSITIVE RODS    Report Status PENDING   Incomplete  Respiratory Panel by RT PCR (Flu A&B, Covid) - Nasopharyngeal Swab     Status: None   Collection Time: 09/01/20  3:34 AM   Specimen: Nasopharyngeal Swab  Result Value Ref Range Status   SARS Coronavirus 2 by RT PCR NEGATIVE NEGATIVE Final    Comment: (NOTE) SARS-CoV-2 target nucleic acids are NOT DETECTED.  The SARS-CoV-2 RNA is generally detectable in upper respiratoy specimens during the acute phase of infection. The lowest concentration of SARS-CoV-2 viral copies this assay can detect is 131 copies/mL. A negative result does not preclude SARS-Cov-2 infection and should not be used as the sole basis for treatment or other patient management decisions. A negative result may occur with  improper specimen collection/handling, submission of specimen other than nasopharyngeal swab, presence of viral mutation(s) within the areas targeted by this assay, and inadequate number of viral copies (<131 copies/mL). A negative result must be combined with clinical observations, patient history, and epidemiological information. The expected result is Negative.  Fact Sheet for Patients:  PinkCheek.be  Fact Sheet for Healthcare Providers:  GravelBags.it  This test is no t yet approved or cleared by the [place] FDA and  has been authorized for detection and/or diagnosis of SARS-CoV-2 by FDA under an Emergency Use Authorization (EUA). This EUA will remain  in effect (meaning this test can be used) for the duration of the COVID-19 declaration under Section 564(b)(1) of the Act, 21 U.S.C. section 360bbb-3(b)(1), unless the authorization is terminated or revoked sooner.     Influenza A by PCR NEGATIVE NEGATIVE Final   Influenza B by PCR NEGATIVE NEGATIVE Final    Comment: (NOTE) The Xpert Xpress SARS-CoV-2/FLU/RSV assay is intended as an aid in  the diagnosis of influenza from Nasopharyngeal swab specimens and  should not  be used as a sole basis for treatment. Nasal washings and  aspirates are unacceptable for Xpert Xpress SARS-CoV-2/FLU/RSV  testing.  Fact Sheet for Patients: PinkCheek.be  Fact Sheet for Healthcare Providers: GravelBags.it  This test is not yet approved or cleared by the [place] FDA and  has been authorized for detection and/or diagnosis of SARS-CoV-2 by  FDA under an Emergency Use Authorization (EUA). This EUA will remain  in effect (meaning this test can be used) for the duration of the  Covid-19 declaration under Section 564(b)(1) of the Act, 21  U.S.C. section 360bbb-3(b)(1), unless the authorization is  terminated or revoked. Performed at [facility], [place] [address] [place], [place] [address]   Blood Culture (routine x 2)     Status: None   Collection Time: 09/01/20  5:58 AM   Specimen: BLOOD LEFT HAND  Result Value Ref Range Status   Specimen Description   Final    BLOOD LEFT HAND Performed at [facility] [address], [place], [place] [address]    Special Requests   Final    BOTTLES DRAWN AEROBIC ONLY Blood Culture adequate volume Performed at [facility] [address] [place], [place] [address]    Culture   Final    NO GROWTH 5 DAYS Performed at [facility], [place] [address], [place],  [address]    Report Status 09/06/2020 FINAL  Final        [place] for Infectious Disease [facility] [phone] pager    09/06/2020, 11:00 AM

## 2020-09-06 NOTE — Progress Notes (Signed)
PHARMACY - TOTAL PARENTERAL NUTRITION CONSULT NOTE   Indication: on TPN prior to admission with hx Croh's disease with short gut syndrome  Patient Measurements: Height: 5' 8"  (172.7 cm) Weight: 63.5 kg (140 lb) IBW/kg (Calculated) : 63.9 TPN AdjBW (KG): 63.5 Body mass index is 21.29 kg/m.  Assessment: TPN was originally on hold due to possibility of PICC removal but ID has decided that will try and keep the line for now and treat bacteremia. As a result, will restart patient's TPN this PM. Per Dr. Linus Salmons on 10/10, ok to resume TPN thru current PICC line  Glucose / Insulin: CBG stable < 150, SSI dc'd 10/11 Electrolytes: K up to 5.3 - probably due to high dose septra Renal: SCr up to 1.77 BUN up to 42- probably due to high does septra LFTs / TGs: AST/ALT down 10/11> lipids added back to TPN 10/11. TG 54 (10/11) Prealbumin / albumin: 30.5/3 on 10/11 Intake / Output; MIVF: D5NS 75 ml/hr from 8a- 6 pm while TPN not running.  GI Imaging:  Surgeries / Procedures: n/a  Central access: PICC line TPN start date: on PTA  Nutritional Goals (per RD recommendation on 10/9): kCal: 1905-2100, Protein: 95-110, Fluid: >= 2L/day  Per home TPN formula, 1800 mL over 14 hours to provide 75g protein and 1081 kcal with no lipid and when lipid added to TPN, provides 75g protein and 1481 kcal  Current Nutrition:  Regular diet + cyclic TPN -Prosource 30 ml BID, [place] 1.4 qday per RD  Plan:  1800 mL of TPN over 14 hours to provide 75g protein and 1458 Kcal   Electrolytes in TPN: 43mq/L of Na, remove all K today, 553m/L of Ca, 76m64mL of Mg, and 176m24mL of Phos. Cl:Ac ratio 1:1  MVI liquid being given PO; trace elements added to TPN  D5NS at 75 ml/hr from 8am to 6pm daily, OFF while TPN running- need to order daily by Rx  Monitor TPN labs on Mon/Thurs, BMET in AM  MichEudelia [email or web address] 09/06/2020 7:27 AM

## 2020-09-07 ENCOUNTER — Inpatient Hospital Stay (HOSPITAL_COMMUNITY): Payer: Medicare Other

## 2020-09-07 ENCOUNTER — Telehealth: Payer: Self-pay

## 2020-09-07 DIAGNOSIS — R7881 Bacteremia: Secondary | ICD-10-CM | POA: Diagnosis not present

## 2020-09-07 DIAGNOSIS — R109 Unspecified abdominal pain: Secondary | ICD-10-CM | POA: Diagnosis not present

## 2020-09-07 DIAGNOSIS — T827XXD Infection and inflammatory reaction due to other cardiac and vascular devices, implants and grafts, subsequent encounter: Secondary | ICD-10-CM | POA: Diagnosis not present

## 2020-09-07 DIAGNOSIS — K50819 Crohn's disease of both small and large intestine with unspecified complications: Secondary | ICD-10-CM | POA: Diagnosis not present

## 2020-09-07 DIAGNOSIS — K912 Postsurgical malabsorption, not elsewhere classified: Secondary | ICD-10-CM

## 2020-09-07 LAB — BASIC METABOLIC PANEL
Anion gap: 7 (ref 5–15)
BUN: 45 mg/dL — ABNORMAL HIGH (ref 6–20)
CO2: 18 mmol/L — ABNORMAL LOW (ref 22–32)
Calcium: 10 mg/dL (ref 8.9–10.3)
Chloride: 104 mmol/L (ref 98–111)
Creatinine, Ser: 1.78 mg/dL — ABNORMAL HIGH (ref 0.44–1.00)
GFR, Estimated: 31 mL/min — ABNORMAL LOW (ref >60)
Glucose, Bld: 155 mg/dL — ABNORMAL HIGH (ref 70–99)
Potassium: 4.3 mmol/L (ref 3.5–5.1)
Sodium: 129 mmol/L — ABNORMAL LOW (ref 135–145)

## 2020-09-07 LAB — HEPATIC FUNCTION PANEL
ALT: 118 U/L — ABNORMAL HIGH (ref 0–44)
AST: 34 U/L (ref 15–41)
Albumin: 3.5 g/dL (ref 3.5–5.0)
Alkaline Phosphatase: 223 U/L — ABNORMAL HIGH (ref 38–126)
Bilirubin, Direct: 0.2 mg/dL (ref 0.0–0.2)
Indirect Bilirubin: 0.5 mg/dL (ref 0.3–0.9)
Total Bilirubin: 0.7 mg/dL (ref 0.3–1.2)
Total Protein: 7.8 g/dL (ref 6.5–8.1)

## 2020-09-07 LAB — GLUCOSE, CAPILLARY
Glucose-Capillary: 159 mg/dL — ABNORMAL HIGH (ref 70–99)
Glucose-Capillary: 134 mg/dL — ABNORMAL HIGH (ref 70–99)

## 2020-09-07 LAB — LIPASE, BLOOD: Lipase: 23 U/L (ref 11–51)

## 2020-09-07 MED ORDER — HYDROMORPHONE HCL 1 MG/ML IJ SOLN
1.5000 mg | INTRAMUSCULAR | Status: DC | PRN
  Administered 2020-09-07 – 2020-09-12 (×51): 1.5 mg via INTRAVENOUS
  Filled 2020-09-07 (×51): qty 1.5

## 2020-09-07 MED ORDER — DEXTROSE-NACL 5-0.9 % IV SOLN: INTRAVENOUS | Status: DC

## 2020-09-07 MED ORDER — IOHEXOL 9 MG/ML PO SOLN: 500.0000 mL | ORAL | Status: AC

## 2020-09-07 MED ORDER — LACTATED RINGERS IV SOLN: INTRAVENOUS | Status: AC

## 2020-09-07 MED ORDER — IOHEXOL 9 MG/ML PO SOLN
ORAL | Status: AC
  Filled 2020-09-07: qty 1000

## 2020-09-07 MED ORDER — FENTANYL CITRATE (PF) 100 MCG/2ML IJ SOLN
25.0000 ug | Freq: Once | INTRAMUSCULAR | Status: AC
  Administered 2020-09-07: 25 ug via INTRAVENOUS
  Filled 2020-09-07: qty 2

## 2020-09-07 MED ORDER — TRAVASOL 10 % IV SOLN
INTRAVENOUS | Status: AC
  Filled 2020-09-07: qty 756

## 2020-09-07 MED ORDER — LEVOFLOXACIN IN D5W 750 MG/150ML IV SOLN
750.0000 mg | INTRAVENOUS | Status: DC
  Administered 2020-09-08 – 2020-09-10 (×2): 750 mg via INTRAVENOUS
  Filled 2020-09-07 (×3): qty 150

## 2020-09-07 MED ORDER — PROMETHAZINE HCL 25 MG/ML IJ SOLN
12.5000 mg | Freq: Four times a day (QID) | INTRAMUSCULAR | Status: DC | PRN
  Administered 2020-09-07 – 2020-09-12 (×16): 12.5 mg via INTRAVENOUS
  Filled 2020-09-07 (×17): qty 1

## 2020-09-07 NOTE — Progress Notes (Addendum)
PHARMACY - TOTAL PARENTERAL NUTRITION CONSULT NOTE   Indication: on TPN prior to admission with hx Crohn's disease with short gut syndrome  Patient Measurements: Height: 5' 8"  (172.7 cm) Weight: 63.5 kg (140 lb) IBW/kg (Calculated) : 63.9 TPN AdjBW (KG): 63.5 Body mass index is 21.29 kg/m.  Assessment: TPN was originally on hold due to possibility of PICC removal but ID has decided that will try and keep the line for now and treat bacteremia. As a result, will restart patient's TPN this PM. Per Dr. Linus Salmons on 10/10, ok to resume TPN thru current PICC line  Glucose / Insulin:  SSI dc'd 10/11, CBG 155/serum 159> acceptable Electrolytes: Na down to 129, K 5.3> 4.1 after high dose septra stopped and K removed from TPN; CorCa 10.8 Renal: SCr 1.78 BUN up to 45- probably due to high dose septra that was stopped yesterday LFTs / TGs: AST/ALT down 10/11> lipids added back to TPN 10/11. TG 54 (10/11) Prealbumin / albumin: 30.5/3 on 10/11 Intake / Output; MIVF: D5NS 75 ml/hr from 8a- 6 pm while TPN not running.  GI Imaging:  Surgeries / Procedures: n/a  Central access: PICC line TPN start date: on PTA  Nutritional Goals (per RD recommendation on 10/9): kCal: 1905-2100, Protein: 95-110, Fluid: >= 2L/day  Per home TPN formula, 1800 mL over 14 hours to provide 75g protein and 1081 kcal with no lipid and when lipid added to TPN, provides 75g protein and 1481 kcal  Current Nutrition:  Regular diet + cyclic TPN -Prosource 30 ml BID, [place] 1.4 qday per RD  Plan:  1800 mL of TPN over 14 hours to provide 75g protein and 1458 Kcal   Electrolytes in TPN: incr to 152mq/L of Na, incr to 25 mEq/L of K, decr to 2.560m/L of Ca, 52m63mL of Mg, and 152m23mL of Phos. Cl:Ac ratio 1:1  MVI liquid being given PO; trace elements added to TPN  D5NS at 75 ml/hr from 8am to 6pm daily, OFF while TPN running- need to order daily by Rx  Monitor TPN labs on Mon/Thurs  MichEudelia [email or web address] 09/07/2020  7:54 AM

## 2020-09-07 NOTE — Progress Notes (Addendum)
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  Repeat bcx out of peripheral is ngtd on 10/07  tte no evidence valvular vegetation  She is tolerating bactrim high dose although her creatinine is slowly rising   Of note ucx with several CoNS species which are not pathogenic in her cases and represent assyptomatic bacteriuria  -------- Creatinine rise stable since switching bactrim --> levo Repeat bcx 10/07 remained negative  Sodium down in setting of 2-3 days nausea and some vomiting. Diarrhea chronic short gut. Also abd pain worsening. Discussed with primary team, will place CT abdomen as courtesy   - abd/pelv ct noncontrast; lipase  - continue levofloxacin 750 mg po renal dosing to finish the 2 week course duration on 10/21 - reasonable to monitor sodium/lytes/cr for improving trend prior to disposition - maintain line for now unless recurrent episodes or fever (then will need simultaneous peripheral and picc blood culture) - patient has ID clinic f/u with Dr Tommy Medal on 10/25 @ 245 for planned repeat surveillant blood cx off abx and general ID f/u    Principal Problem:   Sepsis ([place]) Active Problems:   Crohn's colitis ([place])   Crohn disease ([place])   On total parenteral nutrition (TPN)   Abnormal LFTs   Stage 3b chronic kidney disease ([place])   Bacteremia associated with intravascular line ([place])   Scheduled Meds: . (feeding supplement) PROSource Plus  30 mL Oral BID BM  . amLODipine   10 mg Oral Daily  . budesonide  3 mg Oral Daily  . buPROPion  200 mg Oral Daily  . Chlorhexidine Gluconate Cloth  6 each Topical Daily  . cholecalciferol  1,000 Units Oral Daily  . cycloSPORINE  1 drop Both Eyes BID  . dicyclomine  20 mg Oral TID  . DULoxetine  90 mg Oral Daily  . estradiol  2 mg Oral Daily  . famotidine  20 mg Oral QHS  . feeding supplement ([employer] STANDARD 1.4)  325 mL Oral Daily  . hydrALAZINE  50 mg Oral TID  . levofloxacin  750 mg Oral Q48H  . metoprolol tartrate  100 mg Oral BID  . multivitamin  15 mL Oral Daily  . omega-3 acid ethyl esters  1,000 mg Oral Daily  . pantoprazole  40 mg Oral Daily  . polyvinyl alcohol  1 drop Both Eyes QID  . senna-docusate  1 tablet Oral BID  . sodium chloride flush  10-40 mL Intracatheter Q12H  . sucralfate  1 g Oral TID WC & HS  . vitamin B-12  1,000 mcg Oral Daily   Continuous Infusions: . lactated ringers 100 mL/hr at 09/07/20 0855  . TPN CYCLIC-ADULT (ION)     PRN Meds:.calcium carbonate, diphenoxylate-atropine, hydrALAZINE, HYDROmorphone (DILAUDID) injection, lipase/protease/amylase, loperamide, ondansetron (ZOFRAN) IV, promethazine, promethazine, sodium chloride flush, traZODone   SUBJECTIVE: Continues to feel well No f/c No rash  Reports 2-3 days moderate progressive epigastric pain and n/v. Diarrhea stable  Cr  stable 1.8 after switched to levoflox yesterday. Sodium is on the lower side although doesn't appear volume overloaded  Review of Systems: ROS Negative 11 point ros unless mentioned above  Allergies  Allergen Reactions  . Cefepime Other (See Comments)    Neurotoxicity occurring in setting of AKI. Ceftriaxone tolerated during same admit  . Gabapentin Other (See Comments)    unknown  . Hyoscyamine Hives and Swelling    Legs swelling   Disorientation  . Lyrica [Pregabalin] Other (See Comments)    unknown  . Meperidine Hives    Other reaction(s): GI Upset Due to [place]   . Topamax  [Topiramate] Other (See Comments)    unknown  . Zosyn [Piperacillin Sod-Tazobactam So]     Patient reports it makes her vomit, her neck stiff, and her "heart feel funny"  . Fentanyl Rash    Pt is allergic to fentanyl patch related to the glue (gives her a rash) Pt states she is NOT allergic to fentanyl IV medicine  . Morphine And Related Rash    OBJECTIVE: Vitals:   09/06/20 1353 09/06/20 2141 09/06/20 2143 09/07/20 0443  BP: (!) 169/68 (!) 154/69 (!) 154/69 (!) 146/68  Pulse: 60 65 66 74  Resp: 18  16 16   Temp: 98.4 F (36.9 C)  98.3 F (36.8 C) 98.4 F (36.9 C)  TempSrc: Oral  Oral Oral  SpO2: 100%  99% 99%  Weight:      Height:       Body mass index is 21.29 kg/m.  Physical Exam No distress, pleasant Psych alert/oriented heent atraumatic; per; conj clear cv rrr no mrg Lungs clear abd s/nt Ext no edema Skin no rash; Right femoral picc site no tenderness/purulence Neuro nonfocal  Lab Results Lab Results  Component Value Date   WBC 3.5 (L) 09/05/2020   HGB 8.8 (L) 09/05/2020   HCT 26.4 (L) 09/05/2020   MCV 90.7 09/05/2020   PLT 157 09/05/2020    Lab Results  Component Value Date   CREATININE 1.78 (H) 09/07/2020   BUN 45 (H) 09/07/2020   NA 129 (L) 09/07/2020   K 4.3 09/07/2020   CL 104 09/07/2020   CO2 18 (L) 09/07/2020    Lab Results  Component Value Date   ALT 134 (H) 09/05/2020   AST 24 09/05/2020   ALKPHOS 193 (H) 09/05/2020   BILITOT 0.5 09/05/2020     Microbiology: Recent Results (from the past 240 hour(s))  Blood culture (routine x 2)     Status: Abnormal   Collection Time: 08/30/20 10:50 PM   Specimen: BLOOD  Result Value Ref Range Status   Specimen Description   Final    BLOOD CENTRAL LINE Performed at [facility], [place] [address] [place], [place] [address]    Special Requests   Final    BOTTLES DRAWN AEROBIC AND ANAEROBIC Blood Culture adequate volume Performed at [place]  [address], [place], [place] [address]    Culture  Setup Time   Final    GRAM NEGATIVE RODS AEROBIC BOTTLE ONLY CRITICAL RESULT CALLED TO, READ BACK BY AND VERIFIED WITH: L ADKINS RN 08/31/20 2114 JDW Performed at [facility], [place] [address] [place], [place] [address]    Culture STENOTROPHOMONAS MALTOPHILIA (A)  Final   Report Status 09/02/2020 FINAL  Final   Organism ID, Bacteria STENOTROPHOMONAS MALTOPHILIA  Final      Susceptibility   Stenotrophomonas maltophilia - MIC*    LEVOFLOXACIN 0.5  SENSITIVE Sensitive     TRIMETH/SULFA <=20 SENSITIVE Sensitive     * STENOTROPHOMONAS MALTOPHILIA  Blood Culture ID Panel (Reflexed)     Status: Abnormal   Collection Time: 08/30/20 10:50 PM  Result Value Ref Range Status   Enterococcus faecalis NOT DETECTED NOT DETECTED Final   Enterococcus Faecium NOT DETECTED NOT DETECTED Final   Listeria monocytogenes NOT DETECTED NOT DETECTED Final   Staphylococcus species NOT DETECTED NOT DETECTED Final   Staphylococcus aureus (BCID) NOT DETECTED NOT DETECTED Final   Staphylococcus epidermidis NOT DETECTED NOT DETECTED Final   Staphylococcus lugdunensis NOT DETECTED NOT DETECTED Final   Streptococcus species NOT DETECTED NOT DETECTED Final   Streptococcus agalactiae NOT DETECTED NOT DETECTED Final   Streptococcus pneumoniae NOT DETECTED NOT DETECTED Final   Streptococcus pyogenes NOT DETECTED NOT DETECTED Final   A.calcoaceticus-baumannii NOT DETECTED NOT DETECTED Final   Bacteroides fragilis NOT DETECTED NOT DETECTED Final   Enterobacterales NOT DETECTED NOT DETECTED Final   Enterobacter cloacae complex NOT DETECTED NOT DETECTED Final   Escherichia coli NOT DETECTED NOT DETECTED Final   Klebsiella aerogenes NOT DETECTED NOT DETECTED Final   Klebsiella oxytoca NOT DETECTED NOT DETECTED Final   Klebsiella pneumoniae NOT DETECTED NOT DETECTED Final   Proteus species NOT DETECTED NOT DETECTED Final   Salmonella species NOT DETECTED NOT DETECTED  Final   Serratia marcescens NOT DETECTED NOT DETECTED Final   Haemophilus influenzae NOT DETECTED NOT DETECTED Final   Neisseria meningitidis NOT DETECTED NOT DETECTED Final   Pseudomonas aeruginosa NOT DETECTED NOT DETECTED Final   Stenotrophomonas maltophilia DETECTED (A) NOT DETECTED Final    Comment: CRITICAL RESULT CALLED TO, READ BACK BY AND VERIFIED WITH: L ADKINS RN 08/31/20 2114 JDW    Candida albicans NOT DETECTED NOT DETECTED Final   Candida auris NOT DETECTED NOT DETECTED Final   Candida glabrata NOT DETECTED NOT DETECTED Final   Candida krusei NOT DETECTED NOT DETECTED Final   Candida parapsilosis NOT DETECTED NOT DETECTED Final   Candida tropicalis NOT DETECTED NOT DETECTED Final   Cryptococcus neoformans/gattii NOT DETECTED NOT DETECTED Final    Comment: Performed at [facility], [address] [place], [place] [address]  Respiratory Panel by RT PCR (Flu A&B, Covid) - Nasopharyngeal Swab     Status: None   Collection Time: 08/31/20 12:03 AM   Specimen: Nasopharyngeal Swab  Result Value Ref Range Status   SARS Coronavirus 2 by RT PCR NEGATIVE NEGATIVE Final    Comment: (NOTE) SARS-CoV-2 target nucleic acids are NOT DETECTED.  The SARS-CoV-2 RNA is generally detectable in upper respiratoy specimens during the acute phase of infection. The lowest concentration of SARS-CoV-2 viral copies this assay can detect is 131 copies/mL. A negative result does not preclude SARS-Cov-2 infection and should not be used as the sole basis for treatment or other patient management decisions. A negative result may occur with  improper specimen collection/handling, submission of specimen other than nasopharyngeal swab, presence of viral mutation(s) within the areas targeted by this assay, and inadequate number of viral copies (<131 copies/mL). A negative result must be combined with clinical observations, patient history, and epidemiological information. The expected result is  Negative.  Fact Sheet for Patients:  PinkCheek.be  Fact Sheet for Healthcare Providers:  GravelBags.it  This test is no t yet approved or cleared by the [place] FDA and  has been authorized for detection and/or diagnosis of SARS-CoV-2 by FDA under an Emergency Use Authorization (EUA). This EUA will  remain  in effect (meaning this test can be used) for the duration of the COVID-19 declaration under Section 564(b)(1) of the Act, 21 U.S.C. section 360bbb-3(b)(1), unless the authorization is terminated or revoked sooner.     Influenza A by PCR NEGATIVE NEGATIVE Final   Influenza B by PCR NEGATIVE NEGATIVE Final    Comment: (NOTE) The Xpert Xpress SARS-CoV-2/FLU/RSV assay is intended as an aid in  the diagnosis of influenza from Nasopharyngeal swab specimens and  should not be used as a sole basis for treatment. Nasal washings and  aspirates are unacceptable for Xpert Xpress SARS-CoV-2/FLU/RSV  testing.  Fact Sheet for Patients: PinkCheek.be  Fact Sheet for Healthcare Providers: GravelBags.it  This test is not yet approved or cleared by the [place] FDA and  has been authorized for detection and/or diagnosis of SARS-CoV-2 by  FDA under an Emergency Use Authorization (EUA). This EUA will remain  in effect (meaning this test can be used) for the duration of the  Covid-19 declaration under Section 564(b)(1) of the Act, 21  U.S.C. section 360bbb-3(b)(1), unless the authorization is  terminated or revoked. Performed at [facility], [place] [address], [place], [place] [address]   Urine culture     Status: Abnormal   Collection Time: 08/31/20  3:30 AM   Specimen: In/Out Cath Urine  Result Value Ref Range Status   Specimen Description   Final    IN/OUT CATH URINE Performed at [place] [address], [place],  [place] [address]    Special Requests   Final    NONE Performed at [facility], [place] [address], [place], [place] [address]    Culture (A)  Final    100 COLONIES/mL STAPHYLOCOCCUS EPIDERMIDIS 100 COLONIES/mL STAPHYLOCOCCUS WARNERI    Report Status 09/02/2020 FINAL  Final   Organism ID, Bacteria STAPHYLOCOCCUS EPIDERMIDIS (A)  Final   Organism ID, Bacteria STAPHYLOCOCCUS WARNERI (A)  Final      Susceptibility   Staphylococcus epidermidis - MIC*    CIPROFLOXACIN >=8 RESISTANT Resistant     GENTAMICIN 8 INTERMEDIATE Intermediate     NITROFURANTOIN <=16 SENSITIVE Sensitive     OXACILLIN >=4 RESISTANT Resistant     TETRACYCLINE <=1 SENSITIVE Sensitive     VANCOMYCIN 2 SENSITIVE Sensitive     TRIMETH/SULFA 80 RESISTANT Resistant     CLINDAMYCIN >=8 RESISTANT Resistant     RIFAMPIN <=0.5 SENSITIVE Sensitive     Inducible Clindamycin NEGATIVE Sensitive     * 100 COLONIES/mL STAPHYLOCOCCUS EPIDERMIDIS   Staphylococcus warneri - MIC*    CIPROFLOXACIN <=0.5 SENSITIVE Sensitive     GENTAMICIN <=0.5 SENSITIVE Sensitive     NITROFURANTOIN 32 SENSITIVE Sensitive     OXACILLIN <=0.25 SENSITIVE Sensitive     TETRACYCLINE <=1 SENSITIVE Sensitive     VANCOMYCIN 1 SENSITIVE Sensitive     TRIMETH/SULFA <=10 SENSITIVE Sensitive     CLINDAMYCIN >=8 RESISTANT Resistant     RIFAMPIN <=0.5 SENSITIVE Sensitive     Inducible Clindamycin NEGATIVE Sensitive     * 100 COLONIES/mL STAPHYLOCOCCUS WARNERI  Blood Culture (routine x 2)     Status: Abnormal (Preliminary result)   Collection Time: 08/31/20 11:35 PM   Specimen: BLOOD  Result Value Ref Range Status   Specimen Description   Final    BLOOD PICC LINE Performed at [facility], [place] [address] [place], [place] [address]    Special Requests   Final    BOTTLES DRAWN AEROBIC AND  ANAEROBIC Blood Culture adequate volume Performed at [place] [address] [place], [place] [address]    Culture   Setup Time   Final    GRAM NEGATIVE RODS AEROBIC BOTTLE ONLY CRITICAL VALUE NOTED.  VALUE IS CONSISTENT WITH PREVIOUSLY REPORTED AND CALLED VALUE. GRAM POSITIVE RODS ANAEROBIC BOTTLE ONLY CRITICAL RESULT CALLED TO, READ BACK BY AND VERIFIED WITH: PHARMD [place] 0254 09/06/20 FCP    Culture (A)  Final    STENOTROPHOMONAS MALTOPHILIA SUSCEPTIBILITIES PERFORMED ON PREVIOUS CULTURE WITHIN THE LAST 5 DAYS. GRAM POSITIVE RODS CULTURE REINCUBATED FOR BETTER GROWTH Performed at [facility], [place] [address] [place], [place] [address]    Report Status PENDING  Incomplete  Respiratory Panel by RT PCR (Flu A&B, Covid) - Nasopharyngeal Swab     Status: None   Collection Time: 09/01/20  3:34 AM   Specimen: Nasopharyngeal Swab  Result Value Ref Range Status   SARS Coronavirus 2 by RT PCR NEGATIVE NEGATIVE Final    Comment: (NOTE) SARS-CoV-2 target nucleic acids are NOT DETECTED.  The SARS-CoV-2 RNA is generally detectable in upper respiratoy specimens during the acute phase of infection. The lowest concentration of SARS-CoV-2 viral copies this assay can detect is 131 copies/mL. A negative result does not preclude SARS-Cov-2 infection and should not be used as the sole basis for treatment or other patient management decisions. A negative result may occur with  improper specimen collection/handling, submission of specimen other than nasopharyngeal swab, presence of viral mutation(s) within the areas targeted by this assay, and inadequate number of viral copies (<131 copies/mL). A negative result must be combined with clinical observations, patient history, and epidemiological information. The expected result is Negative.  Fact Sheet for Patients:  PinkCheek.be  Fact Sheet for Healthcare Providers:  GravelBags.it  This test is no t yet approved or cleared by the [place] FDA and  has been authorized for detection  and/or diagnosis of SARS-CoV-2 by FDA under an Emergency Use Authorization (EUA). This EUA will remain  in effect (meaning this test can be used) for the duration of the COVID-19 declaration under Section 564(b)(1) of the Act, 21 U.S.C. section 360bbb-3(b)(1), unless the authorization is terminated or revoked sooner.     Influenza A by PCR NEGATIVE NEGATIVE Final   Influenza B by PCR NEGATIVE NEGATIVE Final    Comment: (NOTE) The Xpert Xpress SARS-CoV-2/FLU/RSV assay is intended as an aid in  the diagnosis of influenza from Nasopharyngeal swab specimens and  should not be used as a sole basis for treatment. Nasal washings and  aspirates are unacceptable for Xpert Xpress SARS-CoV-2/FLU/RSV  testing.  Fact Sheet for Patients: PinkCheek.be  Fact Sheet for Healthcare Providers: GravelBags.it  This test is not yet approved or cleared by the [place] FDA and  has been authorized for detection and/or diagnosis of SARS-CoV-2 by  FDA under an Emergency Use Authorization (EUA). This EUA will remain  in effect (meaning this test can be used) for the duration of the  Covid-19 declaration under Section 564(b)(1) of the Act, 21  U.S.C. section 360bbb-3(b)(1), unless the authorization is  terminated or revoked. Performed at [facility], [place] [address] [place], [place] [address]   Blood Culture (routine x 2)     Status: None   Collection Time: 09/01/20  5:58 AM   Specimen: BLOOD LEFT HAND  Result Value Ref Range Status   Specimen Description   Final    BLOOD LEFT HAND Performed at  [facility], [place] [address], [place], [place] [address]    Special Requests   Final    BOTTLES DRAWN AEROBIC ONLY Blood Culture adequate volume Performed at [place] [address] [place], [place] [address]    Culture   Final    NO GROWTH 5 DAYS Performed at [facility],  [place] [address], [place], [place] [address]    Report Status 09/06/2020 FINAL  Final        Jabier Mutton, MD [facility] [place] [phone] pager    09/07/2020, 9:46 AM

## 2020-09-07 NOTE — Progress Notes (Signed)
Patient complained of pain throughout the shift with little to no relief.   MD on call was paged.  See MAR  Nights 09/06/20: Pt c/o pain 10/10 within 2 hours of dilaudid, crying on side of bed. Fentanyl x1, please discuss pain meds with pt..   ____________________________  Nights 09/05/20: Pt with N/V, stated phenergan and zofran did not help. Compazine x 1 trial.

## 2020-09-07 NOTE — Assessment & Plan Note (Signed)
-   see bacteremia

## 2020-09-07 NOTE — Progress Notes (Signed)
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 She also has had multiple PICC line infections and has had to have several removed from throughout her body over the years and has multiple scars to show where previous lines have been.  She had her most recent PICC placed in the right femoral vein on 08/05/20. She is admitted to the hospital on 09/01/20 with another bacteremia (Stenotrophomonas) and was started on Bactrim, but developed AKI and was then changed to Levaquin.   Furthermore, on 09/07/20 she developed acute abdominal pain on top of her chronic pain she lives with. She was unable to tolerate pills nor oral contrast and due to resolving AKI did not receive IV contrast either; but a CT abd/pelvis was obtained which revealed concern for underlying SBO. Surgery was consulted for further evaluation and assistance given her complex underlying history.    Interval History:  Seen writhing in bed this am begging me for pain medication to help the pain. Briefly reviewed her history with her as this was my first day meeting her. A CT had already been ordered by ID and then I tried discussing alternative options to aggressive care that is starting to become futile; however, she did not want to discuss options of transitioning care to anything else.   Old records reviewed in assessment of this patient  ROS: Constitutional: positive for anorexia, fatigue, malaise and weight loss, Respiratory: negative for cough, Cardiovascular: negative for chest pain and Gastrointestinal: positive for abdominal pain and nausea  Assessment & Plan: * Bacteremia associated with intravascular line ([place]) - right femoral  PICC placed 08/05/20; now with Stenotrophomonas bacteremia; ID following - did not tolerate Bactrim due to AKI, now on Levaquin - patient has had recurrent infections/bacteremias; we are attempting to treat with PICC in place; clinically she's at least tolerating and responding fairly well (e.g. no fevers, she's chronically neutropenic so difficult to even know if sign of sepsis for her, but her differential is relatively reassuring) - tentative plan is to leave PICC in place and continue Levaquin until 10/21 for 14 day course - repeat blood cultures (PICC and PIV) at end of course - outpatient ID f/u on 10/25  Abdominal pain -This is complex and complicated in this patient.  She has underlying Crohn's disease with short gut syndrome and has had multiple abdominal surgeries.  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  CT abdomen/pelvis performed on 09/07/2020 reveals possible obstruction.  Her underlying anatomy complicates the picture with interpretation.  Have asked for surgery input although she is likely not a good candidate for multiple reasons including difficulty with wound healing, already short gut, poor quality of life, likely to have ongoing chronic pain, and poor physical status. - I have brought up palliative care/hosice although she was not receptive to this and stated "I want to live" but she then followed that with tears asking for me to "help me" and "take this pain away". Tried explaining we could truly focus on pain control but this would dramatically shorten her life span especially if we discontinued TPN and truly focused on pain  control as ongoing aggressive measures are not likely to improve her life much with her complex history  - for now, will follow up with surgery for next steps after this recent CT - if able, I will continue to discuss palliative care with the patient daily if she truly does have obstruction  that does not improve or even if it does and she has recurrent infections/hospitalizations, her quality of life is deteriorating steadily  - dilaudid dose increased this morning due to her worsening abdominal pain; which is going to further worsen constipation (and now she is not tolerating orals)   Crohn disease ([place]) - long standing history; multiple abdominal surgeries; 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 Patient relocated from [place] to [place] to be closer to family - for now, continue TPN for short gut syndrome - long term prognosis is poor (multiple hospitalizations, infections, weight loss/malnutrition, chronic pain, poor quality of life)  Stage 3b chronic kidney disease ([place]) - baseline probably close to 1.5 creatinine; mild increase from Bactrim which was changed to Levaquin for above infection - continue IVF and trending BMP  On total parenteral nutrition (TPN) - R femoral PICC in place  - continue TPN  Abnormal LFTs - possibly related to TPN as well - intermittent CMP  Short gut syndrome -See Crohn's disease  Sepsis ([name])-resolved as of 09/07/2020 - see bacteremia    Antimicrobials: Bactrim 09/01/2020>> 09/06/2020 Levaquin 09/06/2020>> present  DVT prophylaxis: SCD Code Status: Full Family Communication: none present Disposition Plan: Status is: Inpatient  Remains inpatient appropriate because:Ongoing active pain requiring inpatient pain management, Unsafe d/c plan, IV treatments appropriate due to intensity of illness or inability to take PO and Inpatient level of care appropriate due to severity of illness   Dispo: The patient is from: Home              Anticipated d/c is  to: Home              Anticipated d/c date is: > 3 days              Patient currently is not medically stable to d/c.       Objective: Blood pressure (!) 180/79, pulse 73, temperature 98.2 F (36.8 C), temperature source Oral, resp. rate 17, height 5' 8"  (1.727 m), weight 63.5 kg, SpO2 99 %.  Examination: General appearance: Chronically ill-appearing adult woman also appearing older than stated age lying in bed tossing and turning in pain appearing in mild distress and very uncomfortable Head: Normocephalic, without obvious abnormality, atraumatic Eyes: EOMI Lungs: clear to auscultation bilaterally Heart: regular rate and rhythm and S1, S2 normal Abdomen: Large midline abdominal scar noted from multiple prior surgeries.  Hypoactive bowel sounds.  Immediate guarding on exam with diffuse tenderness throughout Extremities: Thin, no edema Skin: dry, scaly Neurologic: Grossly normal  Consultants:   ID  Surgery  Procedures:   n/a  Data Reviewed: I have personally reviewed following labs and imaging studies Results for orders placed or performed during the hospital encounter of 08/31/20 (from the past 24 hour(s))  Basic metabolic panel     Status: Abnormal   Collection Time: 09/07/20  4:56 AM  Result Value Ref Range   Sodium 129 (  L) 135 - 145 mmol/L   Potassium 4.3 3.5 - 5.1 mmol/L   Chloride 104 98 - 111 mmol/L   CO2 18 (L) 22 - 32 mmol/L   Glucose, Bld 155 (H) 70 - 99 mg/dL   BUN 45 (H) 6 - 20 mg/dL   Creatinine, Ser 1.78 (H) 0.44 - 1.00 mg/dL   Calcium 10.0 8.9 - 10.3 mg/dL   GFR, Estimated 31 (L) >60 mL/min   Anion gap 7 5 - 15  Glucose, capillary     Status: Abnormal   Collection Time: 09/07/20  6:50 AM  Result Value Ref Range   Glucose-Capillary 159 (H) 70 - 99 mg/dL  Hepatic function panel     Status: Abnormal   Collection Time: 09/07/20 11:36 AM  Result Value Ref Range   Total Protein 7.8 6.5 - 8.1 g/dL   Albumin 3.5 3.5 - 5.0 g/dL   AST 34 15 - 41 U/L    ALT 118 (H) 0 - 44 U/L   Alkaline Phosphatase 223 (H) 38 - 126 U/L   Total Bilirubin 0.7 0.3 - 1.2 mg/dL   Bilirubin, Direct 0.2 0.0 - 0.2 mg/dL   Indirect Bilirubin 0.5 0.3 - 0.9 mg/dL  Lipase, blood     Status: None   Collection Time: 09/07/20 11:36 AM  Result Value Ref Range   Lipase 23 11 - 51 U/L    Recent Results (from the past 240 hour(s))  Blood culture (routine x 2)     Status: Abnormal   Collection Time: 08/30/20 10:50 PM   Specimen: BLOOD  Result Value Ref Range Status   Specimen Description   Final    BLOOD CENTRAL LINE Performed at [facility], [place] [address] [place], [place] [address]    Special Requests   Final    BOTTLES DRAWN AEROBIC AND ANAEROBIC Blood Culture adequate volume Performed at [facility] [address], [place], [place] [address]    Culture  Setup Time   Final    GRAM NEGATIVE RODS AEROBIC BOTTLE ONLY CRITICAL RESULT CALLED TO, READ BACK BY AND VERIFIED WITH: L ADKINS RN 08/31/20 2114 JDW Performed at [facility], [place] [address], [place], [place] [address]    Culture STENOTROPHOMONAS MALTOPHILIA (A)  Final   Report Status 09/02/2020 FINAL  Final   Organism ID, Bacteria STENOTROPHOMONAS MALTOPHILIA  Final      Susceptibility   Stenotrophomonas maltophilia - MIC*    LEVOFLOXACIN 0.5 SENSITIVE Sensitive     TRIMETH/SULFA <=20 SENSITIVE Sensitive     * STENOTROPHOMONAS MALTOPHILIA  Blood Culture ID Panel (Reflexed)     Status: Abnormal   Collection Time: 08/30/20 10:50 PM  Result Value Ref Range Status   Enterococcus faecalis NOT DETECTED NOT DETECTED Final   Enterococcus Faecium NOT DETECTED NOT DETECTED Final   Listeria monocytogenes NOT DETECTED NOT DETECTED Final   Staphylococcus species NOT DETECTED NOT DETECTED Final   Staphylococcus aureus (BCID) NOT DETECTED NOT DETECTED Final   Staphylococcus epidermidis NOT DETECTED NOT DETECTED Final   Staphylococcus lugdunensis NOT DETECTED NOT  DETECTED Final   Streptococcus species NOT DETECTED NOT DETECTED Final   Streptococcus agalactiae NOT DETECTED NOT DETECTED Final   Streptococcus pneumoniae NOT DETECTED NOT DETECTED Final   Streptococcus pyogenes NOT DETECTED NOT DETECTED Final   A.calcoaceticus-baumannii NOT DETECTED NOT DETECTED Final   Bacteroides fragilis NOT DETECTED NOT DETECTED Final   Enterobacterales NOT DETECTED NOT DETECTED Final   Enterobacter cloacae complex NOT  DETECTED NOT DETECTED Final   Escherichia coli NOT DETECTED NOT DETECTED Final   Klebsiella aerogenes NOT DETECTED NOT DETECTED Final   Klebsiella oxytoca NOT DETECTED NOT DETECTED Final   Klebsiella pneumoniae NOT DETECTED NOT DETECTED Final   Proteus species NOT DETECTED NOT DETECTED Final   Salmonella species NOT DETECTED NOT DETECTED Final   Serratia marcescens NOT DETECTED NOT DETECTED Final   Haemophilus influenzae NOT DETECTED NOT DETECTED Final   Neisseria meningitidis NOT DETECTED NOT DETECTED Final   Pseudomonas aeruginosa NOT DETECTED NOT DETECTED Final   Stenotrophomonas maltophilia DETECTED (A) NOT DETECTED Final    Comment: CRITICAL RESULT CALLED TO, READ BACK BY AND VERIFIED WITH: L ADKINS RN 08/31/20 2114 JDW    Candida albicans NOT DETECTED NOT DETECTED Final   Candida auris NOT DETECTED NOT DETECTED Final   Candida glabrata NOT DETECTED NOT DETECTED Final   Candida krusei NOT DETECTED NOT DETECTED Final   Candida parapsilosis NOT DETECTED NOT DETECTED Final   Candida tropicalis NOT DETECTED NOT DETECTED Final   Cryptococcus neoformans/gattii NOT DETECTED NOT DETECTED Final    Comment: Performed at [facility], [place] [address] [place], [place] [address]  Respiratory Panel by RT PCR (Flu A&B, Covid) - Nasopharyngeal Swab     Status: None   Collection Time: 08/31/20 12:03 AM   Specimen: Nasopharyngeal Swab  Result Value Ref Range Status   SARS Coronavirus 2 by RT PCR NEGATIVE NEGATIVE Final    Comment: (NOTE) SARS-CoV-2  target nucleic acids are NOT DETECTED.  The SARS-CoV-2 RNA is generally detectable in upper respiratoy specimens during the acute phase of infection. The lowest concentration of SARS-CoV-2 viral copies this assay can detect is 131 copies/mL. A negative result does not preclude SARS-Cov-2 infection and should not be used as the sole basis for treatment or other patient management decisions. A negative result may occur with  improper specimen collection/handling, submission of specimen other than nasopharyngeal swab, presence of viral mutation(s) within the areas targeted by this assay, and inadequate number of viral copies (<131 copies/mL). A negative result must be combined with clinical observations, patient history, and epidemiological information. The expected result is Negative.  Fact Sheet for Patients:  PinkCheek.be  Fact Sheet for Healthcare Providers:  GravelBags.it  This test is no t yet approved or cleared by the [place] FDA and  has been authorized for detection and/or diagnosis of SARS-CoV-2 by FDA under an Emergency Use Authorization (EUA). This EUA will remain  in effect (meaning this test can be used) for the duration of the COVID-19 declaration under Section 564(b)(1) of the Act, 21 U.S.C. section 360bbb-3(b)(1), unless the authorization is terminated or revoked sooner.     Influenza A by PCR NEGATIVE NEGATIVE Final   Influenza B by PCR NEGATIVE NEGATIVE Final    Comment: (NOTE) The Xpert Xpress SARS-CoV-2/FLU/RSV assay is intended as an aid in  the diagnosis of influenza from Nasopharyngeal swab specimens and  should not be used as a sole basis for treatment. Nasal washings and  aspirates are unacceptable for Xpert Xpress SARS-CoV-2/FLU/RSV  testing.  Fact Sheet for Patients: PinkCheek.be  Fact Sheet for Healthcare  Providers: GravelBags.it  This test is not yet approved or cleared by the [place] FDA and  has been authorized for detection and/or diagnosis of SARS-CoV-2 by  FDA under an Emergency Use Authorization (EUA). This EUA will remain  in effect (meaning this test can be used) for the duration of the  Covid-19 declaration  under Section 564(b)(1) of the Act, 21  U.S.C. section 360bbb-3(b)(1), unless the authorization is  terminated or revoked. Performed at [facility], [place] [address] [place], [place] [address]   Urine culture     Status: Abnormal   Collection Time: 08/31/20  3:30 AM   Specimen: In/Out Cath Urine  Result Value Ref Range Status   Specimen Description   Final    IN/OUT CATH URINE Performed at [facility] [address], [place], [place] [address]    Special Requests   Final    NONE Performed at [facility], [place] [address] [place], [place] [address]    Culture (A)  Final    100 COLONIES/mL STAPHYLOCOCCUS EPIDERMIDIS 100 COLONIES/mL STAPHYLOCOCCUS WARNERI    Report Status 09/02/2020 FINAL  Final   Organism ID, Bacteria STAPHYLOCOCCUS EPIDERMIDIS (A)  Final   Organism ID, Bacteria STAPHYLOCOCCUS WARNERI (A)  Final      Susceptibility   Staphylococcus epidermidis - MIC*    CIPROFLOXACIN >=8 RESISTANT Resistant     GENTAMICIN 8 INTERMEDIATE Intermediate     NITROFURANTOIN <=16 SENSITIVE Sensitive     OXACILLIN >=4 RESISTANT Resistant     TETRACYCLINE <=1 SENSITIVE Sensitive     VANCOMYCIN 2 SENSITIVE Sensitive     TRIMETH/SULFA 80 RESISTANT Resistant     CLINDAMYCIN >=8 RESISTANT Resistant     RIFAMPIN <=0.5 SENSITIVE Sensitive     Inducible Clindamycin NEGATIVE Sensitive     * 100 COLONIES/mL STAPHYLOCOCCUS EPIDERMIDIS   Staphylococcus warneri - MIC*    CIPROFLOXACIN <=0.5 SENSITIVE Sensitive     GENTAMICIN <=0.5 SENSITIVE Sensitive     NITROFURANTOIN 32 SENSITIVE  Sensitive     OXACILLIN <=0.25 SENSITIVE Sensitive     TETRACYCLINE <=1 SENSITIVE Sensitive     VANCOMYCIN 1 SENSITIVE Sensitive     TRIMETH/SULFA <=10 SENSITIVE Sensitive     CLINDAMYCIN >=8 RESISTANT Resistant     RIFAMPIN <=0.5 SENSITIVE Sensitive     Inducible Clindamycin NEGATIVE Sensitive     * 100 COLONIES/mL STAPHYLOCOCCUS WARNERI  Blood Culture (routine x 2)     Status: Abnormal (Preliminary result)   Collection Time: 08/31/20 11:35 PM   Specimen: BLOOD  Result Value Ref Range Status   Specimen Description   Final    BLOOD PICC LINE Performed at [facility], [place] [address], [place], [place] [address]    Special Requests   Final    BOTTLES DRAWN AEROBIC AND ANAEROBIC Blood Culture adequate volume Performed at [facility] [address], [place], [place] [address]    Culture  Setup Time   Final    GRAM NEGATIVE RODS AEROBIC BOTTLE ONLY CRITICAL VALUE NOTED.  VALUE IS CONSISTENT WITH PREVIOUSLY REPORTED AND CALLED VALUE. GRAM POSITIVE RODS ANAEROBIC BOTTLE ONLY CRITICAL RESULT CALLED TO, READ BACK BY AND VERIFIED WITH: PHARMD [place] 3254 09/06/20 FCP    Culture (A)  Final    STENOTROPHOMONAS MALTOPHILIA SUSCEPTIBILITIES PERFORMED ON PREVIOUS CULTURE WITHIN THE LAST 5 DAYS. GRAM POSITIVE RODS CULTURE REINCUBATED FOR BETTER GROWTH Performed at [facility], [place] [address] [place], [place] [address]    Report Status PENDING  Incomplete  Respiratory Panel by RT PCR (Flu A&B, Covid) - Nasopharyngeal Swab     Status: None   Collection Time: 09/01/20  3:34 AM   Specimen: Nasopharyngeal Swab  Result Value Ref Range Status   SARS Coronavirus 2 by RT PCR NEGATIVE NEGATIVE Final  Comment: (NOTE) SARS-CoV-2 target nucleic acids are NOT DETECTED.  The SARS-CoV-2 RNA is generally detectable in upper respiratoy specimens during the acute phase of infection. The lowest concentration of SARS-CoV-2 viral copies this assay can  detect is 131 copies/mL. A negative result does not preclude SARS-Cov-2 infection and should not be used as the sole basis for treatment or other patient management decisions. A negative result may occur with  improper specimen collection/handling, submission of specimen other than nasopharyngeal swab, presence of viral mutation(s) within the areas targeted by this assay, and inadequate number of viral copies (<131 copies/mL). A negative result must be combined with clinical observations, patient history, and epidemiological information. The expected result is Negative.  Fact Sheet for Patients:  PinkCheek.be  Fact Sheet for Healthcare Providers:  GravelBags.it  This test is no t yet approved or cleared by the [place] FDA and  has been authorized for detection and/or diagnosis of SARS-CoV-2 by FDA under an Emergency Use Authorization (EUA). This EUA will remain  in effect (meaning this test can be used) for the duration of the COVID-19 declaration under Section 564(b)(1) of the Act, 21 U.S.C. section 360bbb-3(b)(1), unless the authorization is terminated or revoked sooner.     Influenza A by PCR NEGATIVE NEGATIVE Final   Influenza B by PCR NEGATIVE NEGATIVE Final    Comment: (NOTE) The Xpert Xpress SARS-CoV-2/FLU/RSV assay is intended as an aid in  the diagnosis of influenza from Nasopharyngeal swab specimens and  should not be used as a sole basis for treatment. Nasal washings and  aspirates are unacceptable for Xpert Xpress SARS-CoV-2/FLU/RSV  testing.  Fact Sheet for Patients: PinkCheek.be  Fact Sheet for Healthcare Providers: GravelBags.it  This test is not yet approved or cleared by the [place] FDA and  has been authorized for detection and/or diagnosis of SARS-CoV-2 by  FDA under an Emergency Use Authorization (EUA). This EUA will remain  in  effect (meaning this test can be used) for the duration of the  Covid-19 declaration under Section 564(b)(1) of the Act, 21  U.S.C. section 360bbb-3(b)(1), unless the authorization is  terminated or revoked. Performed at [facility], [place] [address], [place], [place] [address]   Blood Culture (routine x 2)     Status: None   Collection Time: 09/01/20  5:58 AM   Specimen: BLOOD LEFT HAND  Result Value Ref Range Status   Specimen Description   Final    BLOOD LEFT HAND Performed at [facility] [address], [place], [place] [address]    Special Requests   Final    BOTTLES DRAWN AEROBIC ONLY Blood Culture adequate volume Performed at [facility] [address] [place], [place] [address]    Culture   Final    NO GROWTH 5 DAYS Performed at [facility], [place] [address], [place], [place] [address]    Report Status 09/06/2020 FINAL  Final     Radiology Studies: CT ABDOMEN PELVIS WO CONTRAST  Result Date: 09/07/2020 CLINICAL DATA:  History of Crohn's disease. Abdominal pain. Rule out small bowel obstruction. Progressive nausea and vomiting EXAM: CT ABDOMEN AND PELVIS WITHOUT CONTRAST TECHNIQUE: Multidetector CT imaging of the abdomen and pelvis was performed following the standard protocol without IV contrast. COMPARISON:  08/31/2020 FINDINGS: Lower chest: No acute findings Hepatobiliary: Within the limitations of unenhanced technique no suspicious liver abnormality identified. Pneumobilia is identified compatible with biliary patency. Previous cholecystectomy. Pancreas: Similar appearance of mild increase caliber  of the main pancreatic duct. No signs of pancreatic inflammation or mass. Spleen: Normal in size without focal abnormality. Adrenals/Urinary Tract: Normal appearance of the adrenal glands. The kidneys are unremarkable. No mass or hydronephrosis identified. Moderate bladder distension. No focal bladder abnormality.  Stomach/Bowel: The stomach appears unremarkable. There are signs of multiple bowel resections involving both large and small bowel loops with resultant complex anatomy. Evaluation of the bowel loops is further complicated by lack of IV and oral contrast material. Must of the small bowel loops are identified within the right hemiabdomen and measure up to 3.7 cm in maximum diameter. Diminished caliber small bowel loops are noted within the central abdomen. The patient appears to be status post right hemicolectomy. There is gaseous distension of the distal transverse colon beyond the level of the enterocolonic anastomosis. A large stool burden identified within the distal transverse colon, splenic flexure and descending colon. There are several intervening segments of gas distended colon are noted between stool filled loops of distal colon. Vascular/Lymphatic: There is a right-sided central venous catheter with the tip in a branch of the hepatic vein. Mild aortic atherosclerosis. No aneurysm. No abdominopelvic adenopathy. Reproductive: Status post hysterectomy. No adnexal masses. Other: No free fluid or fluid collections Musculoskeletal: No acute or significant osseous findings. IMPRESSION: 1. There are signs of multiple bowel resections involving both large and small bowel loops with resultant complex bowel anatomy. Evaluation of the bowel loops is further complicated by lack of IV and oral contrast material. Several dilated loops of small bowel are noted in the right abdomen with air-fluid levels. Cannot exclude partial small bowel obstruction. If there is a continued concern for bowel obstruction and location of possible transition point is of clinical concern then follow-up imaging after administration of oral and IV contrast material is recommended. 2. Large stool burden identified within the distal transverse colon, splenic flexure and descending colon. There are several intervening segments of gas distended  colon between stool filled loops of distal colon. Findings may reflect constipation. 3. Pneumobilia compatible with biliary patency. 4. Right-sided central venous catheter with the tip in a branch of the hepatic vein. 5. Aortic atherosclerosis. Aortic Atherosclerosis (ICD10-I70.0). Electronically Signed   By: Kerby Moors M.D.   On: 09/07/2020 15:05   CT ABDOMEN PELVIS WO CONTRAST  Final Result      Scheduled Meds: . (feeding supplement) PROSource Plus  30 mL Oral BID BM  . amLODipine  10 mg Oral Daily  . budesonide  3 mg Oral Daily  . buPROPion  200 mg Oral Daily  . Chlorhexidine Gluconate Cloth  6 each Topical Daily  . cholecalciferol  1,000 Units Oral Daily  . cycloSPORINE  1 drop Both Eyes BID  . dicyclomine  20 mg Oral TID  . DULoxetine  90 mg Oral Daily  . estradiol  2 mg Oral Daily  . famotidine  20 mg Oral QHS  . feeding supplement ([employer] STANDARD 1.4)  325 mL Oral Daily  . hydrALAZINE  50 mg Oral TID  . iohexol      . metoprolol tartrate  100 mg Oral BID  . multivitamin  15 mL Oral Daily  . omega-3 acid ethyl esters  1,000 mg Oral Daily  . pantoprazole  40 mg Oral Daily  . polyvinyl alcohol  1 drop Both Eyes QID  . senna-docusate  1 tablet Oral BID  . sodium chloride flush  10-40 mL Intracatheter Q12H  . sucralfate  1 g Oral TID WC &  HS  . vitamin B-12  1,000 mcg Oral Daily   PRN Meds: calcium carbonate, diphenoxylate-atropine, hydrALAZINE, HYDROmorphone (DILAUDID) injection, lipase/protease/amylase, loperamide, ondansetron (ZOFRAN) IV, promethazine, promethazine, sodium chloride flush, traZODone Continuous Infusions: . lactated ringers 100 mL/hr at 09/07/20 0855  . [START ON 09/08/2020] levofloxacin (LEVAQUIN) IV    . TPN CYCLIC-ADULT (ION)        LOS: 6 days  Time spent: Greater than 50% of the 35 minute visit was spent in counseling/coordination of care for the patient as laid out in the A&P.   Dwyane Dee, MD [facility] 09/07/2020, 4:29  PM

## 2020-09-07 NOTE — Assessment & Plan Note (Addendum)
-   baseline probably close to 1.5-1.7 creatinine; mild increase from Bactrim which was changed to Levaquin for above infection - creatinine appears to have peaked at 2.02, now has trended back to baseline - continue IVF when TPN is off and trend BMP

## 2020-09-07 NOTE — Assessment & Plan Note (Signed)
-   R femoral PICC in place  - continue TPN

## 2020-09-07 NOTE — Consult Note (Addendum)
[facility] Consult Note  Caitlin Peters Mar 01, 1961  [id number].    Requesting MD: Dwyane Dee, MD Chief Complaint/Reason for Consult: possible pSBO, short gut syndrome  HPI:  Caitlin. 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 Due to worsening abdominal pain, nausea, and vomiting a CT scan of the abdomen was ordered without PO/IV contrast. CT revealed possible pSBO and surgery has been asked to evaluate.  Caitlin Peters is very nice and cooperative and describes a long history of Chron's disease. She reports being treated for her disease in [place] at the [facility] for the last 11 years before moving to [place], [place] in November of 2020 to be closer to her daughters/family due to her declining health, weakness, and falling at home. She has had 6+ abdominal surgeries related to her Crohn's disease/SBOs resulting in short gut syndrome. She has been TPN dependent since 2003 and takes in very limited PO at home. Her most recent surgery was a bowel-lengthening procedure performed at [facility] in 2018 which she states was an attempt at getting her off of TPN but it did not work. She says she was not a candidate for bowel transplant due to her lack of family support- her daughters are supportive but work full time and were not able to be present enough during her perioperative period and recovery.  At baseline she reports chronic supraumbilical abdominal pain that is worse with PO intake and improves when she stops eating and drinking. She also has chronic nausea at baseline - states she takes phenergan when she wakes up, experiences progressive nausea throughout the day, and requires more antiemetics during the day to keep all of her PO meds down. Because of her  short guy she has a history of frequent bowel movements but says these have been controlled to 1-2 liquid, non-bloody stools per day using lomotil and imodium. This hospitalization she reports worsening, more constant upper abdominal and left sided abdominal pain for the last 3-4 days. Pain is worse when she eats. She also reports worse nausea and frequent vomiting compared to her baseline. She is very determined to get better but states she does not think she wound want more surgery.  ROS: Review of Systems  Constitutional: Positive for malaise/fatigue and weight loss. Negative for chills and fever.  HENT: Negative.   Eyes: Negative.   Respiratory: Negative.   Cardiovascular: Negative.   Gastrointestinal: Positive for abdominal pain, diarrhea, nausea and vomiting. Negative for blood in stool, constipation and melena.  Genitourinary: Negative.   Musculoskeletal: Positive for back pain, falls (none this admission, but history of falls) and joint pain.  Skin: Negative.   Neurological: Negative.   Endo/Heme/Allergies: Negative.   Psychiatric/Behavioral: Negative.     Family History  Problem Relation Age of Onset  . Breast cancer Sister   . Multiple sclerosis Sister   . Diabetes Sister   . Lupus Sister   . Colon cancer Other   . Crohn's disease Other   . Seizures Mother   . Glaucoma Mother   . CAD Father   . Heart disease Father   . Hypertension Father     Past Medical History:  Diagnosis Date  . Acute pancreatitis 04/13/2020  . Anasarca 10/2019  . AVN (avascular necrosis of bone) (  [place])   . Cataract   . Chronic pain syndrome   . CKD (chronic kidney disease), stage III ([place])   . Crohn disease ([place])   . Crohn disease ([place])   . Depression   . Diverticulosis   . GERD (gastroesophageal reflux disease)   . HTN (hypertension)   . IDA (iron deficiency anemia)   . Malnutrition ([place])   . Mass in chest   . Osteoporosis   . Pancreatitis   . Short gut syndrome   . Vitamin B12  deficiency     Past Surgical History:  Procedure Laterality Date  . ABDOMINAL HYSTERECTOMY    . ABDOMINAL SURGERY     colon resection with abdominal stoma, repair of intestinal leak, reversal of abdominal stoma  . BILIARY DILATION  11/26/2019   Procedure: BILIARY DILATION;  Surgeon: Jackquline Denmark, MD;  Location: [facility];  Service: Endoscopy;;  . BILIARY DILATION  03/08/2020   Procedure: BILIARY DILATION;  Surgeon: Irving Copas., MD;  Location: [place];  Service: Gastroenterology;;  . BIOPSY  03/08/2020   Procedure: BIOPSY;  Surgeon: Irving Copas., MD;  Location: [place];  Service: Gastroenterology;;  . CHEST WALL RESECTION     right thoracotomy,resection of chest mass with anterior rib and reconstruction using prosthetic mesh and video arthroscopy  . CHOLECYSTECTOMY    . COLONOSCOPY  2019  . ERCP N/A 11/26/2019   Procedure: ENDOSCOPIC RETROGRADE CHOLANGIOPANCREATOGRAPHY (ERCP);  Surgeon: Jackquline Denmark, MD;  Location: [facility];  Service: Endoscopy;  Laterality: N/A;  . ERCP N/A 03/08/2020   Procedure: ENDOSCOPIC RETROGRADE CHOLANGIOPANCREATOGRAPHY (ERCP);  Surgeon: Irving Copas., MD;  Location: [place];  Service: Gastroenterology;  Laterality: N/A;  . ESOPHAGOGASTRODUODENOSCOPY N/A 03/08/2020   Procedure: ESOPHAGOGASTRODUODENOSCOPY (EGD);  Surgeon: Irving Copas., MD;  Location: [place];  Service: Gastroenterology;  Laterality: N/A;  . EUS N/A 03/08/2020   Procedure: UPPER ENDOSCOPIC ULTRASOUND (EUS) LINEAR;  Surgeon: Irving Copas., MD;  Location: [place];  Service: Gastroenterology;  Laterality: N/A;  . IR FLUORO GUIDE CV LINE LEFT  01/07/2020  . IR FLUORO GUIDE CV LINE LEFT  03/09/2020  . IR FLUORO GUIDE CV LINE LEFT  05/09/2020  . IR FLUORO GUIDE CV LINE LEFT  07/20/2020  . IR FLUORO GUIDE CV LINE RIGHT  08/05/2020  . IR PTA VENOUS EXCEPT DIALYSIS CIRCUIT  01/07/2020  . IR REMOVAL TUN CV CATH W/O FL  08/05/2020   . IR US GUIDE VASC ACCESS LEFT     x 2 06/17/19 and 09/14/2019  . IR US GUIDE VASC ACCESS RIGHT  08/05/2020  . KNEE SURGERY     right knee   . REMOVAL OF STONES  11/26/2019   Procedure: REMOVAL OF STONES;  Surgeon: Jackquline Denmark, MD;  Location: [facility];  Service: Endoscopy;;  . REMOVAL OF STONES  03/08/2020   Procedure: REMOVAL OF STONES;  Surgeon: Irving Copas., MD;  Location: [place];  Service: Gastroenterology;;  . SMALL INTESTINE SURGERY     x3  . SPHINCTEROTOMY  11/26/2019   Procedure: SPHINCTEROTOMY;  Surgeon: Jackquline Denmark, MD;  Location: [facility];  Service: Endoscopy;;  . UPPER GASTROINTESTINAL ENDOSCOPY      Social History:  reports that she has quit smoking. She has never used smokeless tobacco. She reports previous alcohol use. She reports that she does not use drugs.  Allergies:  Allergies  Allergen Reactions  . Cefepime Other (See Comments)    Neurotoxicity occurring in setting of AKI. Ceftriaxone tolerated during same  admit  . Gabapentin Other (See Comments)    unknown  . Hyoscyamine Hives and Swelling    Legs swelling   Disorientation  . Lyrica [Pregabalin] Other (See Comments)    unknown  . Meperidine Hives    Other reaction(s): GI Upset Due to [place]   . Topamax [Topiramate] Other (See Comments)    unknown  . Zosyn [Piperacillin Sod-Tazobactam So]     Patient reports it makes her vomit, her neck stiff, and her "heart feel funny"  . Fentanyl Rash    Pt is allergic to fentanyl patch related to the glue (gives her a rash) Pt states she is NOT allergic to fentanyl IV medicine  . Morphine And Related Rash    Medications Prior to Admission  Medication Sig Dispense Refill  . acetaminophen (TYLENOL) 325 MG tablet Take 650 mg by mouth every 6 (six) hours as needed for mild pain.     [name] Kitchen amLODipine (NORVASC) 10 MG tablet Take 1 tablet (10 mg total) by mouth daily. 90 tablet 1  . budesonide (ENTOCORT EC) 3 MG 24 hr capsule TAKE 3 CAPSULES  BY MOUTH ONCE DAILY (Patient taking differently: Take 3 mg by mouth daily. ) 90 capsule 0  . buPROPion (WELLBUTRIN SR) 100 MG 12 hr tablet Take 2 tablets by mouth once daily (Patient taking differently: Take 200 mg by mouth daily. ) 60 tablet 5  . calcium carbonate (TUMS - DOSED IN MG ELEMENTAL CALCIUM) 500 MG chewable tablet Chew 1 tablet by mouth 3 (three) times daily as needed for indigestion or heartburn.    . cholecalciferol (VITAMIN D3) 25 MCG (1000 UT) tablet Take 1,000 Units by mouth daily.     . cycloSPORINE (RESTASIS) 0.05 % ophthalmic emulsion Place 1 drop into both eyes 2 (two) times daily.    [name] Kitchen dexlansoprazole (DEXILANT) 60 MG capsule Take 1 capsule (60 mg total) by mouth daily. 90 capsule 1  . Dextran 70-Hypromellose 0.1-0.3 % SOLN Place 1 drop into both eyes 4 (four) times daily.    [name] Kitchen dicyclomine (BENTYL) 20 MG tablet Take 20 mg by mouth 3 (three) times daily.    . diphenoxylate-atropine (LOMOTIL) 2.5-0.025 MG tablet Take 1 tablet by mouth 4 (four) times daily as needed for diarrhea or loose stools. 30 tablet 0  . DULoxetine (CYMBALTA) 30 MG capsule Take 3 capsules (90 mg total) by mouth daily. 90 capsule 1  . estradiol (ESTRACE) 2 MG tablet Take 1 tablet (2 mg total) by mouth daily. 90 tablet 1  . famotidine (PEPCID) 20 MG tablet Take 20 mg by mouth at bedtime.    . hydrALAZINE (APRESOLINE) 50 MG tablet Take 1 tablet (50 mg total) by mouth 3 (three) times daily. 90 tablet 2  . lipase/protease/amylase (CREON) 36000 UNITS CPEP capsule Take 1 capsule (36,000 Units total) by mouth 3 (three) times daily as needed (with meals for digestion). 180 capsule 0  . loperamide (IMODIUM) 2 MG capsule TAKE 1 CAPSULE BY MOUTH AS NEEDED FOR DIARRHEA OR  LOOSE  STOOLS (Patient taking differently: Take 2 mg by mouth as needed for diarrhea or loose stools. ) 90 capsule 0  . methadone (DOLOPHINE) 5 MG tablet Take 1 tablet (5 mg total) by mouth 5 (five) times daily. 30 tablet 0  . metoprolol tartrate  (LOPRESSOR) 100 MG tablet Take 1 tablet (100 mg total) by mouth 2 (two) times daily. 90 tablet 1  . Multiple Vitamins-Minerals (MULTIVITAMIN ADULT PO) Take 1 tablet by mouth daily.    [name] Kitchen  Omega-3 1000 MG CAPS Take 1,000 mg by mouth daily.     [name] Kitchen PRESCRIPTION MEDICATION Inject 1 each into the vein daily. Home TPN . [facility] [place] . 1 bag for 16 hours. [phone]    . Probiotic Product (PROBIOTIC-10 PO) Take 1 capsule by mouth daily.     . promethazine (PHENERGAN) 25 MG tablet TAKE ONE TABLET BY MOUTH EVERY 6 HOURS AS NEEDED FOR NAUSEA AND VOMITING (Patient taking differently: Take 25 mg by mouth every 6 (six) hours as needed for nausea or vomiting. ) 90 tablet 1  . senna-docusate (SENOKOT-S) 8.6-50 MG tablet Take 1 tablet by mouth 2 (two) times daily.    . sucralfate (CARAFATE) 1 GM/10ML suspension Take 10 mLs (1 g total) by mouth 4 (four) times daily -  with meals and at bedtime. 420 mL 2  . traZODone (DESYREL) 100 MG tablet Take 2 tablets (200 mg total) by mouth at bedtime as needed for sleep. 60 tablet 0  . vitamin B-12 (CYANOCOBALAMIN) 100 MCG tablet Take 1,000 mcg by mouth daily.       Blood pressure (!) 180/79, pulse 73, temperature 98.2 F (36.8 C), temperature source Oral, resp. rate 17, height 5' 8"  (1.727 m), weight 63.5 kg, SpO2 99 %. Physical Exam: Constitutional: NAD; conversant; no deformities Eyes: Moist conjunctiva; no lid lag; anicteric; PERRL Neck: Trachea midline; no thyromegaly Lungs: Normal respiratory effort; no tactile fremitus CV: RRR; no palpable thrills; no pitting edema GI: Abd firm, mild global tenderness with voluntary guarding, not peritonitis, high pitched bowel sounds, no palpable hepatosplenomegaly, previous laparotomy scars, previous ostomy site right hemi-abdomen appear well-healed. MSK: symmetrical, diffuse muscle wasting; no clubbing/cyanosis Psychiatric: Appropriate affect; alert and oriented x3 Lymphatic: No palpable cervical or  axillary lymphadenopathy  Results for orders placed or performed during the hospital encounter of 08/31/20 (from the past 48 hour(s))  Basic metabolic panel     Status: Abnormal   Collection Time: 09/06/20  4:40 AM  Result Value Ref Range   Sodium 135 135 - 145 mmol/L   Potassium 5.3 (H) 3.5 - 5.1 mmol/L   Chloride 106 98 - 111 mmol/L   CO2 19 (L) 22 - 32 mmol/L   Glucose, Bld 106 (H) 70 - 99 mg/dL    Comment: Glucose reference range applies only to samples taken after fasting for at least 8 hours.   BUN 42 (H) 6 - 20 mg/dL   Creatinine, Ser 1.77 (H) 0.44 - 1.00 mg/dL   Calcium 10.1 8.9 - 10.3 mg/dL   GFR, Estimated 31 (L) >60 mL/min   Anion gap 10 5 - 15    Comment: Performed at [facility], [place] [address], [place], [place] [address]  Basic metabolic panel     Status: Abnormal   Collection Time: 09/07/20  4:56 AM  Result Value Ref Range   Sodium 129 (L) 135 - 145 mmol/L   Potassium 4.3 3.5 - 5.1 mmol/L    Comment: DELTA CHECK NOTED   Chloride 104 98 - 111 mmol/L   CO2 18 (L) 22 - 32 mmol/L   Glucose, Bld 155 (H) 70 - 99 mg/dL    Comment: Glucose reference range applies only to samples taken after fasting for at least 8 hours.   BUN 45 (H) 6 - 20 mg/dL   Creatinine, Ser 1.78 (H) 0.44 - 1.00 mg/dL   Calcium 10.0 8.9 - 10.3 mg/dL   GFR, Estimated 31 (L) >60 mL/min   Anion  gap 7 5 - 15    Comment: Performed at [facility], [place] [address], [place], [place] [address]  Glucose, capillary     Status: Abnormal   Collection Time: 09/07/20  6:50 AM  Result Value Ref Range   Glucose-Capillary 159 (H) 70 - 99 mg/dL    Comment: Glucose reference range applies only to samples taken after fasting for at least 8 hours.  Hepatic function panel     Status: Abnormal   Collection Time: 09/07/20 11:36 AM  Result Value Ref Range   Total Protein 7.8 6.5 - 8.1 g/dL   Albumin 3.5 3.5 - 5.0 g/dL   AST 34 15 - 41 U/L   ALT 118 (H) 0 - 44 U/L   Alkaline  Phosphatase 223 (H) 38 - 126 U/L   Total Bilirubin 0.7 0.3 - 1.2 mg/dL   Bilirubin, Direct 0.2 0.0 - 0.2 mg/dL   Indirect Bilirubin 0.5 0.3 - 0.9 mg/dL    Comment: Performed at [facility], [place] [address], [place], [place] [address]  Lipase, blood     Status: None   Collection Time: 09/07/20 11:36 AM  Result Value Ref Range   Lipase 23 11 - 51 U/L    Comment: Performed at [facility], [place] [address], [place], [place] [address]   CT ABDOMEN PELVIS WO CONTRAST  Result Date: 09/07/2020 CLINICAL DATA:  History of Crohn's disease. Abdominal pain. Rule out small bowel obstruction. Progressive nausea and vomiting EXAM: CT ABDOMEN AND PELVIS WITHOUT CONTRAST TECHNIQUE: Multidetector CT imaging of the abdomen and pelvis was performed following the standard protocol without IV contrast. COMPARISON:  08/31/2020 FINDINGS: Lower chest: No acute findings Hepatobiliary: Within the limitations of unenhanced technique no suspicious liver abnormality identified. Pneumobilia is identified compatible with biliary patency. Previous cholecystectomy. Pancreas: Similar appearance of mild increase caliber of the main pancreatic duct. No signs of pancreatic inflammation or mass. Spleen: Normal in size without focal abnormality. Adrenals/Urinary Tract: Normal appearance of the adrenal glands. The kidneys are unremarkable. No mass or hydronephrosis identified. Moderate bladder distension. No focal bladder abnormality. Stomach/Bowel: The stomach appears unremarkable. There are signs of multiple bowel resections involving both large and small bowel loops with resultant complex anatomy. Evaluation of the bowel loops is further complicated by lack of IV and oral contrast material. Must of the small bowel loops are identified within the right hemiabdomen and measure up to 3.7 cm in maximum diameter. Diminished caliber small bowel loops are noted within the central abdomen. The patient appears to  be status post right hemicolectomy. There is gaseous distension of the distal transverse colon beyond the level of the enterocolonic anastomosis. A large stool burden identified within the distal transverse colon, splenic flexure and descending colon. There are several intervening segments of gas distended colon are noted between stool filled loops of distal colon. Vascular/Lymphatic: There is a right-sided central venous catheter with the tip in a branch of the hepatic vein. Mild aortic atherosclerosis. No aneurysm. No abdominopelvic adenopathy. Reproductive: Status post hysterectomy. No adnexal masses. Other: No free fluid or fluid collections Musculoskeletal: No acute or significant osseous findings. IMPRESSION: 1. There are signs of multiple bowel resections involving both large and small bowel loops with resultant complex bowel anatomy. Evaluation of the bowel loops is further complicated by lack of IV and oral contrast material. Several dilated loops of small bowel are noted in the right abdomen with air-fluid levels. Cannot exclude partial small bowel obstruction. If there  is a continued concern for bowel obstruction and location of possible transition point is of clinical concern then follow-up imaging after administration of oral and IV contrast material is recommended. 2. Large stool burden identified within the distal transverse colon, splenic flexure and descending colon. There are several intervening segments of gas distended colon between stool filled loops of distal colon. Findings may reflect constipation. 3. Pneumobilia compatible with biliary patency. 4. Right-sided central venous catheter with the tip in a branch of the hepatic vein. 5. Aortic atherosclerosis. Aortic Atherosclerosis (ICD10-I70.0). Electronically Signed   By: Kerby Moors M.D.   On: 09/07/2020 15:05   Assessment/Plan CKD3 Bacteremia - stenotrophomonas maltophilia 10/6 Crohn's Disease - diagnosed 1980's Short gut syndrome -  last abdominal surgery 2018 at [place] [facility], per chart review has 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 remaining with a jejunal to transverse colon anastomosis. TPN dependent. Per patient she was not a candidate for bowel transplant while in [place] but I do think it may be worth while for her to get another opinion from a tertiary/academic center with small bowel transplant service in the future, given she has more family support here. At this time she is not open to abdominal surgery so we would need to discuss this with the patient in more detail.  Possible pSBO   - based on patients history she is having an exacerbation of her chronic abdominal symptoms with worse pain and nausea than her baseline. Her CT shows and decompressed stomach, dilated small bowel, and some retained stool in her left colon. There are no signs of intestinal ischemia or free air. Based on her exam and her CT scan she does not have any emergent surgical needs. At this point I would recommend holding her lomotil and imodium. During my exam the patient said her nausea was slightly improved compared to earlier in the day but given exacerbation of pain, nausea, vomiting, and belching I would recommend NG tube placement if her vomiting recurs. Would recommend NPO, ice chips and monitor overnight. CCS will follow.  Jill Alexanders, PA-C [facility] Please see Amion for pager number during day hours 7:00am-4:30pm 09/07/2020, 4:47 PM

## 2020-09-07 NOTE — Assessment & Plan Note (Signed)
-  See Crohn's disease

## 2020-09-07 NOTE — Assessment & Plan Note (Signed)
-   long standing history; multiple abdominal surgeries; 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 Patient relocated from [place] to [place] to be closer to family - for now, continue TPN for short gut syndrome - long term prognosis is poor (multiple hospitalizations, infections, weight loss/malnutrition, chronic pain, poor quality of life)

## 2020-09-07 NOTE — Assessment & Plan Note (Addendum)
-   likely related to TPN as well - intermittent CMP; stable LFTs

## 2020-09-07 NOTE — Assessment & Plan Note (Addendum)
-   right femoral PICC placed 08/05/20; now with Stenotrophomonas bacteremia; ID following - did not tolerate Bactrim due to AKI, now on Levaquin - patient has had recurrent infections/bacteremias; we are attempting to treat with PICC in place; clinically she's at least tolerating and responding fairly well (e.g. no fevers, she's chronically neutropenic so difficult to even know if sign of sepsis for her, but her differential is relatively reassuring) - tentative plan is to leave PICC in place and continue Levaquin until 10/20 for 14 day course - blood culture from 10/7 is negative  - if fever repeat blood cultures (PICC and PIV) - outpatient ID f/u on 10/25; repeat BC x 2 per ID rec's

## 2020-09-07 NOTE — Assessment & Plan Note (Addendum)
-   CT abdomen/pelvis performed on 09/07/2020 reveals possible obstruction.  Her underlying anatomy complicates the picture with interpretation.  Greatly appreciate surgery assistance in the management of her care.  Supportive and symptomatic management is ultimate goal.  Patient declining surgery unless absolutely necessary -NG tube required evening of 09/08/2020; she tolerated NG tube removal on 09/11/2020. -Diet also able to be advanced and she tolerated well with no increase in pain or nausea/vomiting

## 2020-09-07 NOTE — Progress Notes (Signed)
Chronic Care Management Pharmacy Assistant   Name: Caitlin Peters  MRN: [id number] DOB: [birth year]  Reason for Encounter: Medication Review  PCP : [name], [name], MD  Allergies:   Allergies  Allergen Reactions  . Cefepime Other (See Comments)    Neurotoxicity occurring in setting of AKI. Ceftriaxone tolerated during same admit  . Gabapentin Other (See Comments)    unknown  . Hyoscyamine Hives and Swelling    Legs swelling   Disorientation  . Lyrica [Pregabalin] Other (See Comments)    unknown  . Meperidine Hives    Other reaction(s): GI Upset Due to [place]   . Topamax [Topiramate] Other (See Comments)    unknown  . Zosyn [Piperacillin Sod-Tazobactam So]     Patient reports it makes her vomit, her neck stiff, and her "heart feel funny"  . Fentanyl Rash    Pt is allergic to fentanyl patch related to the glue (gives her a rash) Pt states she is NOT allergic to fentanyl IV medicine  . Morphine And Related Rash    Medications: Facility-Administered Encounter Medications as of 09/07/2020  Medication  . (feeding supplement) PROSource Plus liquid 30 mL  . amLODipine (NORVASC) tablet 10 mg  . budesonide (ENTOCORT EC) 24 hr capsule 3 mg  . buPROPion [facility] SR) 12 hr tablet 200 mg  . calcium carbonate (TUMS - dosed in mg elemental calcium) chewable tablet 200 mg of elemental calcium  . Chlorhexidine Gluconate Cloth 2 % PADS 6 each  . cholecalciferol (VITAMIN D3) tablet 1,000 Units  . cycloSPORINE (RESTASIS) 0.05 % ophthalmic emulsion 1 drop  . dicyclomine (BENTYL) tablet 20 mg  . diphenoxylate-atropine (LOMOTIL) 2.5-0.025 MG per tablet 1 tablet  . DULoxetine (CYMBALTA) DR capsule 90 mg  . estradiol (ESTRACE) tablet 2 mg  . famotidine (PEPCID) tablet 20 mg  . feeding supplement (K[place]ARMS STANDARD 1.4) liquid 325 mL  . hydrALAZINE (APRESOLINE) injection 5 mg  . hydrALAZINE (APRESOLINE) tablet 50 mg  . HYDROmorphone (DILAUDID) injection 1.5 mg  .  lactated ringers infusion  . levofloxacin (LEVAQUIN) tablet 750 mg  . lipase/protease/amylase (CREON) capsule 36,000 Units  . loperamide (IMODIUM) capsule 2 mg  . metoprolol tartrate (LOPRESSOR) tablet 100 mg  . multivitamin liquid 15 mL  . omega-3 acid ethyl esters (LOVAZA) capsule 1,000 mg  . ondansetron (ZOFRAN) injection 4 mg  . pantoprazole (PROTONIX) EC tablet 40 mg  . polyvinyl alcohol (LIQUIFILM TEARS) 1.4 % ophthalmic solution 1 drop  . promethazine (PHENERGAN) injection 12.5 mg  . promethazine (PHENERGAN) tablet 25 mg  . senna-docusate (Senokot-S) tablet 1 tablet  . sodium chloride flush (NS) 0.9 % injection 10-40 mL  . sodium chloride flush (NS) 0.9 % injection 10-40 mL  . sucralfate (CARAFATE) 1 GM/10ML suspension 1 g  . TPN CYCLIC-ADULT (ION)  . traZODone (DESYREL) tablet 200 mg  . vitamin B-12 (CYANOCOBALAMIN) tablet 1,000 mcg   Outpatient Encounter Medications as of 09/07/2020  Medication Sig  . acetaminophen (TYLENOL) 325 MG tablet Take 650 mg by mouth every 6 (six) hours as needed for mild pain.   [name] Kitchen amLODipine (NORVASC) 10 MG tablet Take 1 tablet (10 mg total) by mouth daily.  . budesonide (ENTOCORT EC) 3 MG 24 hr capsule TAKE 3 CAPSULES BY MOUTH ONCE DAILY (Patient taking differently: Take 3 mg by mouth daily. )  . buPROPion (WELLBUTRIN SR) 100 MG 12 hr tablet Take 2 tablets by mouth once daily (Patient taking differently: Take 200 mg by mouth daily. )  .  calcium carbonate (TUMS - DOSED IN MG ELEMENTAL CALCIUM) 500 MG chewable tablet Chew 1 tablet by mouth 3 (three) times daily as needed for indigestion or heartburn.  . cholecalciferol (VITAMIN D3) 25 MCG (1000 UT) tablet Take 1,000 Units by mouth daily.   . cycloSPORINE (RESTASIS) 0.05 % ophthalmic emulsion Place 1 drop into both eyes 2 (two) times daily.  [name] Kitchen dexlansoprazole (DEXILANT) 60 MG capsule Take 1 capsule (60 mg total) by mouth daily.  [name] Kitchen Dextran 70-Hypromellose 0.1-0.3 % SOLN Place 1 drop into both eyes 4  (four) times daily.  [name] Kitchen dicyclomine (BENTYL) 20 MG tablet Take 20 mg by mouth 3 (three) times daily.  . diphenoxylate-atropine (LOMOTIL) 2.5-0.025 MG tablet Take 1 tablet by mouth 4 (four) times daily as needed for diarrhea or loose stools.  . DULoxetine (CYMBALTA) 30 MG capsule Take 3 capsules (90 mg total) by mouth daily.  [name] Kitchen estradiol (ESTRACE) 2 MG tablet Take 1 tablet (2 mg total) by mouth daily.  . famotidine (PEPCID) 20 MG tablet Take 20 mg by mouth at bedtime.  . hydrALAZINE (APRESOLINE) 50 MG tablet Take 1 tablet (50 mg total) by mouth 3 (three) times daily.  . lipase/protease/amylase (CREON) 36000 UNITS CPEP capsule Take 1 capsule (36,000 Units total) by mouth 3 (three) times daily as needed (with meals for digestion).  [name] Kitchen loperamide (IMODIUM) 2 MG capsule TAKE 1 CAPSULE BY MOUTH AS NEEDED FOR DIARRHEA OR  LOOSE  STOOLS (Patient taking differently: Take 2 mg by mouth as needed for diarrhea or loose stools. )  . methadone (DOLOPHINE) 5 MG tablet Take 1 tablet (5 mg total) by mouth 5 (five) times daily.  . metoprolol tartrate (LOPRESSOR) 100 MG tablet Take 1 tablet (100 mg total) by mouth 2 (two) times daily.  . Multiple Vitamins-Minerals (MULTIVITAMIN ADULT PO) Take 1 tablet by mouth daily.  . Omega-3 1000 MG CAPS Take 1,000 mg by mouth daily.   [name] Kitchen PRESCRIPTION MEDICATION Inject 1 each into the vein daily. Home TPN . [facility] . 1 bag for 16 hours. [phone]  . Probiotic Product (PROBIOTIC-10 PO) Take 1 capsule by mouth daily.   . promethazine (PHENERGAN) 25 MG tablet TAKE ONE TABLET BY MOUTH EVERY 6 HOURS AS NEEDED FOR NAUSEA AND VOMITING (Patient taking differently: Take 25 mg by mouth every 6 (six) hours as needed for nausea or vomiting. )  . senna-docusate (SENOKOT-S) 8.6-50 MG tablet Take 1 tablet by mouth 2 (two) times daily.  . sucralfate (CARAFATE) 1 GM/10ML suspension Take 10 mLs (1 g total) by mouth 4 (four) times daily -  with meals and at bedtime.    . traZODone (DESYREL) 100 MG tablet Take 2 tablets (200 mg total) by mouth at bedtime as needed for sleep.  . vitamin B-12 (CYANOCOBALAMIN) 100 MCG tablet Take 1,000 mcg by mouth daily.     Current Diagnosis: Patient Active Problem List   Diagnosis Date Noted  . Acute metabolic encephalopathy   . Hypomagnesemia   . Partial small bowel obstruction ([place])   . Abnormal CT of the abdomen   . Bacteremia associated with intravascular line ([place]) 08/04/2020  . Sepsis ([place]) 08/02/2020  . Enterobacter sepsis ([place]) 07/22/2020  . SIRS (systemic inflammatory response syndrome) ([place]) 07/19/2020  . Anxiety 06/03/2020  . Acute pancreatitis 04/13/2020  . Infection due to Stenotrophomonas maltophilia 03/10/2020  . Abnormal LFTs 03/05/2020  . Pancytopenia ([place]) 03/05/2020  . Stage 3b chronic kidney disease ([place]) 03/05/2020  . Central line infection   .  On total parenteral nutrition (TPN) 01/02/2020  . Elevated liver enzymes 01/02/2020  . Cholangitis   . Anasarca 10/28/2019  . Acute kidney injury superimposed on chronic kidney disease ([place]) 10/28/2019  . Falls 10/28/2019  . Crohn disease ([place])   . Malnutrition ([place])   . Short gut syndrome   . HTN (hypertension)   . Vitamin B12 deficiency   . Osteoporosis   . Depression   . GERD (gastroesophageal reflux disease)   . Chronic pain syndrome   . Hypokalemia due to excessive gastrointestinal loss of potassium 10/13/2019  . Diarrhea   . Fever   . Hypokalemia 10/12/2019  . Chronic diarrhea 10/12/2019  . Dysuria 10/12/2019  . Bilateral lower extremity edema 10/12/2019  . Crohn's colitis ([place]) 10/12/2019  . Anemia 10/12/2019  . Hypertension 10/12/2019  . AKI (acute kidney injury) ([place]) 10/12/2019    Follow-Up:  Pharmacist Review   Reviewed chart for medication changes ahead of medication coordination call.  OVs, Consults, or hospital visits since last care coordination call/Pharmacist visit.   08/02/2020 ED  08/30/2020 ED  08/31/2020  ED No medication changes indicated OR if recent visit, treatment plan here.  BP Readings from Last 3 Encounters:  09/07/20 (!) 146/68  08/31/20 (!) 129/57  08/12/20 (!) 159/93    Lab Results  Component Value Date   HGBA1C 4.7 (L) 10/28/2019     Patient obtains medications through Adherence Packaging  30 Days   Last adherence delivery included: (medication name and frequency)  None ID Patient declined (meds) last month due to PRN use/additional supply on hand.  None ID  Patient is due for next adherence delivery on: 09/27/2020. Called patient and reviewed medications and coordinated delivery.  This delivery to include:  Promethazine 25 MG -PRN  Sucralfate 10 mLs Four times Daily  Creon Dr 36,000 Units Capsule-PRN  Loperamide 23m -PRN  Duloxetine 30 MG- three times Daily- Breakfast  Amlodipine 10 MG -Daily Breakfast  Dicyclomine 20 MG-Three times Daily-two at [employer] Potassium Chloride 20 mEq  -Twice Daily- Breakfast  Bupropion SR 100 mg-Twice Daily- Breakfast  Estradiol 2 mg  -Daily-Breakfast  Metoprolol Tartrate 100 mg  -Twice Daily-Breakfast,Evening Meal.  Trazodone 100 mg -PRN  Coordinated acute fill for (med) to be delivered (date).  Promethazine 25 MG -PRN  Patient declined the following medications (meds) due to (reason)  Budesonide 3 mg-Twice Daily-Breakfast (adequate supply)  Dexlansoprazole 60 mg  Capsule-Breakfast (adequate supply)  Hydralazine 50 mg Three times-Daily -Breakfast, Lunch, Bedtime  Patient needs refills for Duloxetine,Potassium Chloride,Trazodone.  Confirmed delivery date of 09/27/2020, advised patient that pharmacy will contact them the morning of delivery.  BBoutonPharmacist Assistant [phone]

## 2020-09-07 NOTE — Hospital Course (Addendum)
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 She also has had multiple PICC line infections and has had to have several removed from throughout her body over the years.  She had her most recent PICC placed in the right femoral vein on 08/05/20. She is admitted to the hospital on 09/01/20 with another bacteremia (Stenotrophomonas) and was started on Bactrim, but developed AoCKDIII and was then changed to Levaquin.  Renal function did recover and downtrend near baseline prior to discharge.   Furthermore, on 09/07/20 she developed acute abdominal pain on top of her chronic pain. She was unable to tolerate pills nor oral contrast and due to resolving AKI and did not receive IV contrast either; but a CT abd/pelvis was obtained which revealed concern for underlying pSBO. Surgery was consulted for further evaluation and assistance given her complex underlying history.  After evaluation, the patient had decided she did not want any further surgeries especially if not emergently needed.  It was decided to manage her supportively and if she developed any further nausea and/or vomiting, then to place NG tube. On the night of 09/08/2020, she became more nauseous and had severe vomiting.  NG tube was then placed.  She noticed improvement in her symptoms after this.  On 10/17 she was feeling better some with return of some flatus and bowel movements. Was still having large output from NGT ~850 cc but was interested in a trial off the NGT and resuming diet.  She tolerated trial off of NG tube well and advancement of her diet.  Pain returned to its normal chronic state and she had no further nausea/vomiting.  For her bacteremia, her course of Levaquin will be completed on 09/14/2020 (renal dosing of q48h).  She will follow up with infectious disease on 09/19/2020 and have repeat blood  cultures drawn at that time.  She only required 1 more pill at discharge to be taken on 09/14/2020.  She was given oral dose of Levaquin on 09/12/2020 prior to discharge.

## 2020-09-08 DIAGNOSIS — T827XXD Infection and inflammatory reaction due to other cardiac and vascular devices, implants and grafts, subsequent encounter: Secondary | ICD-10-CM | POA: Diagnosis not present

## 2020-09-08 DIAGNOSIS — R109 Unspecified abdominal pain: Secondary | ICD-10-CM | POA: Diagnosis not present

## 2020-09-08 DIAGNOSIS — K56600 Partial intestinal obstruction, unspecified as to cause: Secondary | ICD-10-CM

## 2020-09-08 DIAGNOSIS — R7881 Bacteremia: Secondary | ICD-10-CM | POA: Diagnosis not present

## 2020-09-08 LAB — COMPREHENSIVE METABOLIC PANEL
ALT: 89 U/L — ABNORMAL HIGH (ref 0–44)
AST: 31 U/L (ref 15–41)
Albumin: 3.2 g/dL — ABNORMAL LOW (ref 3.5–5.0)
Alkaline Phosphatase: 206 U/L — ABNORMAL HIGH (ref 38–126)
Anion gap: 8 (ref 5–15)
BUN: 53 mg/dL — ABNORMAL HIGH (ref 6–20)
CO2: 19 mmol/L — ABNORMAL LOW (ref 22–32)
Calcium: 9.4 mg/dL (ref 8.9–10.3)
Chloride: 105 mmol/L (ref 98–111)
Creatinine, Ser: 2.02 mg/dL — ABNORMAL HIGH (ref 0.44–1.00)
GFR, Estimated: 26 mL/min — ABNORMAL LOW (ref >60)
Glucose, Bld: 126 mg/dL — ABNORMAL HIGH (ref 70–99)
Potassium: 4.6 mmol/L (ref 3.5–5.1)
Sodium: 132 mmol/L — ABNORMAL LOW (ref 135–145)
Total Bilirubin: 0.5 mg/dL (ref 0.3–1.2)
Total Protein: 7.1 g/dL (ref 6.5–8.1)

## 2020-09-08 LAB — CBC WITH DIFFERENTIAL/PLATELET
Abs Immature Granulocytes: 0.02 10*3/uL (ref 0.00–0.07)
Basophils Absolute: 0 10*3/uL (ref 0.0–0.1)
Basophils Relative: 0 %
Eosinophils Absolute: 0 10*3/uL (ref 0.0–0.5)
Eosinophils Relative: 1 %
HCT: 28.3 % — ABNORMAL LOW (ref 36.0–46.0)
Hemoglobin: 9.4 g/dL — ABNORMAL LOW (ref 12.0–15.0)
Immature Granulocytes: 0 %
Lymphocytes Relative: 39 %
Lymphs Abs: 1.9 10*3/uL (ref 0.7–4.0)
MCH: 29.8 pg (ref 26.0–34.0)
MCHC: 33.2 g/dL (ref 30.0–36.0)
MCV: 89.8 fL (ref 80.0–100.0)
Monocytes Absolute: 0.3 10*3/uL (ref 0.1–1.0)
Monocytes Relative: 6 %
Neutro Abs: 2.7 10*3/uL (ref 1.7–7.7)
Neutrophils Relative %: 54 %
Platelets: 206 10*3/uL (ref 150–400)
RBC: 3.15 MIL/uL — ABNORMAL LOW (ref 3.87–5.11)
RDW: 14 % (ref 11.5–15.5)
WBC: 4.9 10*3/uL (ref 4.0–10.5)
nRBC: 0 % (ref 0.0–0.2)

## 2020-09-08 LAB — MAGNESIUM: Magnesium: 1.9 mg/dL (ref 1.7–2.4)

## 2020-09-08 LAB — PHOSPHORUS: Phosphorus: 4.8 mg/dL — ABNORMAL HIGH (ref 2.5–4.6)

## 2020-09-08 LAB — GLUCOSE, CAPILLARY
Glucose-Capillary: 138 mg/dL — ABNORMAL HIGH (ref 70–99)
Glucose-Capillary: 115 mg/dL — ABNORMAL HIGH (ref 70–99)

## 2020-09-08 MED ORDER — HYDRALAZINE HCL 20 MG/ML IJ SOLN: 10.0000 mg | INTRAMUSCULAR | Status: DC | PRN

## 2020-09-08 MED ORDER — INSULIN ASPART 100 UNIT/ML ~~LOC~~ SOLN: 0.0000 [IU] | SUBCUTANEOUS | Status: DC

## 2020-09-08 MED ORDER — TRAVASOL 10 % IV SOLN
INTRAVENOUS | Status: AC
  Filled 2020-09-08: qty 756

## 2020-09-08 MED ORDER — LABETALOL HCL 5 MG/ML IV SOLN
10.0000 mg | INTRAVENOUS | Status: DC | PRN
  Filled 2020-09-08: qty 4

## 2020-09-08 MED ORDER — LACTATED RINGERS IV BOLUS
1000.0000 mL | INTRAVENOUS | Status: DC
  Administered 2020-09-09 – 2020-09-12 (×4): 1000 mL via INTRAVENOUS

## 2020-09-08 NOTE — Progress Notes (Signed)
PHARMACY - TOTAL PARENTERAL NUTRITION CONSULT NOTE   Indication: on TPN prior to admission with hx Crohn's disease with short gut syndrome  Patient Measurements: Height: 5' 8"  (172.7 cm) Weight: 63.5 kg (140 lb) IBW/kg (Calculated) : 63.9 TPN AdjBW (KG): 63.5 Body mass index is 21.29 kg/m.  Assessment: TPN was originally on hold due to possibility of PICC removal but ID has decided that will try and keep the line for now and treat bacteremia. Per Dr. Linus Salmons on 10/10, ok to resume TPN through current PICC line.  Glucose / Insulin:  SSI dc'd 10/11, CBGs from previous 24 hrs are WNL (goal 100-150).  Electrolytes: Na (132) remains low but slightly improved. K WNL but on upper end of normal. Phos (4.8) is elevated. All other lytes WNL including CorrCa (10). -Bicarb remains low Renal: SCr 2.02, BUN 53 - both increasing. Worsening renal function but good UOP recorded. LFTs / TGs: AST WNL, ALT trending down; TG WNL. Lipids added back to TPN on 10/11 Prealbumin / albumin: 30.5/3.2 Intake / Output; MIVF: LR @ 100 mL/hr per MD -Unmeasured UOP x3 GI Imaging:  Surgeries / Procedures: n/a  Central access: PICC line TPN start date: on PTA  Nutritional Goals (per RD recommendation on 10/14): kCal: 1905-2100, Protein: 95-110, Fluid: >= 2L/day  Per home TPN formula, 1800 mL over 14 hours to provide 75g protein and 1081 kcal with no lipid and when lipid added to TPN, provides 75g protein and 1481 kcal  Current Nutrition:  CLD for comfort + cyclic TPN -Prosource 30 mL BID, [place] 1.4 qday per RD  Plan:  1800 mL of TPN over 14 hours to provide 75g protein and 1458 Kcal   Electrolytes in TPN: Remove K, Phos  124mq/L of Na, 0 mEq/L of K, 2.5104m/L of Ca, 58m62mL of Mg, and 0 mmol/L of Phos.   Cl:Ac ratio 1:2  MVI liquid being given PO; trace elements added to TPN  LR @ 100 mL/hr when TPN off per MD  Monitor TPN labs on Mon/Thurs; recheck electrolytes with AM labs tomorrow  Caitlin NoonharmD 09/08/20 11:58 AM

## 2020-09-08 NOTE — Care Management Important Message (Signed)
Important Message  Patient Details IM Letter given to the Patient Name: Caitlin Peters MRN: [id number] Date of Birth: June 18, 1961   Medicare Important Message Given:  Yes     [name] 09/08/2020, 11:07 AM

## 2020-09-08 NOTE — Progress Notes (Signed)
[facility] for Infectious Disease  Date of Admission:  08/31/2020      Lines: Old right femoral picc  Abx: 10/12-c levoflox renal dosing based on 750 mg dose  10/06-10/12 bactrim ds 2 tablet po q8hour   Assessment/Plan:  59yo F with crohn's with hx partial bowel resection, now TPN dependent with limited venous access, admitted 10/06 for sepsis found to have central line related stenotrophomonas bacteremia  Although only picc (10/5 and 10/06) cultures obtained, the resultant bcx no other bacteria,  and no other focus of infection.  Repeat bcx out of peripheral is ngtd on 10/07  tte no evidence valvular vegetation  She is tolerating bactrim high dose although her creatinine is slowly rising, switched to levoflox 10/12   Of note ucx with several CoNS species which are not pathogenic in her cases and represent assyptomatic bacteriuria  -------- Increased abd pain the last few days with ct scan showing partial SBO. On conservative management   - continue levofloxacin 750 mg renal dosing (changed to IV on 10/13), to finish the 2 week course duration on 10/21 - maintain line for now unless recurrent episodes or fever (then will need simultaneous peripheral and picc blood culture)  -surgery following for probable partial SBO  - patient has ID clinic f/u with Dr Tommy Medal on 10/25 @ 245 for planned repeat surveillant blood cx off abx and general ID f/u    Principal Problem:   Bacteremia associated with intravascular line ([place]) Active Problems:   Crohn's colitis ([place])   Crohn disease ([place])   Short gut syndrome   On total parenteral nutrition (TPN)   Abnormal LFTs   Stage 3b chronic kidney disease ([place])   Abdominal pain   Scheduled Meds: . (feeding supplement) PROSource Plus  30 mL Oral BID BM  . amLODipine  10 mg Oral Daily  . budesonide  3 mg Oral Daily  . buPROPion  200 mg Oral Daily  . Chlorhexidine Gluconate Cloth  6 each Topical Daily  .  cholecalciferol  1,000 Units Oral Daily  . cycloSPORINE  1 drop Both Eyes BID  . dicyclomine  20 mg Oral TID  . DULoxetine  90 mg Oral Daily  . estradiol  2 mg Oral Daily  . famotidine  20 mg Oral QHS  . feeding supplement (K[employer]ARMS STANDARD 1.4)  325 mL Oral Daily  . hydrALAZINE  50 mg Oral TID  . metoprolol tartrate  100 mg Oral BID  . multivitamin  15 mL Oral Daily  . omega-3 acid ethyl esters  1,000 mg Oral Daily  . pantoprazole  40 mg Oral Daily  . polyvinyl alcohol  1 drop Both Eyes QID  . senna-docusate  1 tablet Oral BID  . sodium chloride flush  10-40 mL Intracatheter Q12H  . sucralfate  1 g Oral TID WC & HS  . vitamin B-12  1,000 mcg Oral Daily   Continuous Infusions: . lactated ringers Stopped (09/07/20 1745)  . levofloxacin (LEVAQUIN) IV     PRN Meds:.calcium carbonate, hydrALAZINE, HYDROmorphone (DILAUDID) injection, labetalol, lipase/protease/amylase, ondansetron (ZOFRAN) IV, promethazine, promethazine, sodium chloride flush, traZODone   SUBJECTIVE: Chronic n/abd pain worse the last 3 days; 10/13 abd ct with probable partial SBO No f/c No rash  Review of Systems: ROS Negative 11 point ros unless mentioned above  Allergies  Allergen Reactions  . Cefepime Other (See Comments)    Neurotoxicity occurring in setting of AKI. Ceftriaxone tolerated during same  admit  . Gabapentin Other (See Comments)    unknown  . Hyoscyamine Hives and Swelling    Legs swelling   Disorientation  . Lyrica [Pregabalin] Other (See Comments)    unknown  . Meperidine Hives    Other reaction(s): GI Upset Due to [place]   . Topamax [Topiramate] Other (See Comments)    unknown  . Zosyn [Piperacillin Sod-Tazobactam So]     Patient reports it makes her vomit, her neck stiff, and her "heart feel funny"  . Fentanyl Rash    Pt is allergic to fentanyl patch related to the glue (gives her a rash) Pt states she is NOT allergic to fentanyl IV medicine  . Morphine And Related Rash     OBJECTIVE: Vitals:   09/07/20 0443 09/07/20 1309 09/07/20 2032 09/08/20 0607  BP: (!) 146/68 (!) 180/79 (!) 130/59 (!) 168/75  Pulse: 74 73 75 79  Resp: 16 17 18 16   Temp: 98.4 F (36.9 C) 98.2 F (36.8 C) 99 F (37.2 C) 98.9 F (37.2 C)  TempSrc: Oral Oral Oral Oral  SpO2: 99% 99% 99% 98%  Weight:      Height:       Body mass index is 21.29 kg/m.  Physical Exam Moderate distress complaining of nausea; 1 episode watery vomiting during interview Psych alert/oriented heent atraumatic; per; conj clear cv rrr no mrg Lungs clear abd soft, mild epigastric tenderness, no guarding or rebound Ext no edema Skin no rash; Right femoral picc site no tenderness/purulence Neuro nonfocal  Lab Results Lab Results  Component Value Date   WBC 4.9 09/08/2020   HGB 9.4 (L) 09/08/2020   HCT 28.3 (L) 09/08/2020   MCV 89.8 09/08/2020   PLT 206 09/08/2020    Lab Results  Component Value Date   CREATININE 2.02 (H) 09/08/2020   BUN 53 (H) 09/08/2020   NA 132 (L) 09/08/2020   K 4.6 09/08/2020   CL 105 09/08/2020   CO2 19 (L) 09/08/2020    Lab Results  Component Value Date   ALT 89 (H) 09/08/2020   AST 31 09/08/2020   ALKPHOS 206 (H) 09/08/2020   BILITOT 0.5 09/08/2020     Microbiology: Recent Results (from the past 240 hour(s))  Blood culture (routine x 2)     Status: Abnormal   Collection Time: 08/30/20 10:50 PM   Specimen: BLOOD  Result Value Ref Range Status   Specimen Description   Final    BLOOD CENTRAL LINE Performed at [facility], [place] [address], [place], [place] [address]    Special Requests   Final    BOTTLES DRAWN AEROBIC AND ANAEROBIC Blood Culture adequate volume Performed at [facility] [address], [place], [place] [address]    Culture  Setup Time   Final    GRAM NEGATIVE RODS AEROBIC BOTTLE ONLY CRITICAL RESULT CALLED TO, READ BACK BY AND VERIFIED WITH: L ADKINS RN 08/31/20 2114 JDW Performed at [facility], [place] [address], [place], [place] [address]    Culture STENOTROPHOMONAS MALTOPHILIA (A)  Final   Report Status 09/02/2020 FINAL  Final   Organism ID, Bacteria STENOTROPHOMONAS MALTOPHILIA  Final      Susceptibility   Stenotrophomonas maltophilia - MIC*    LEVOFLOXACIN 0.5 SENSITIVE Sensitive     TRIMETH/SULFA <=20 SENSITIVE Sensitive     * STENOTROPHOMONAS MALTOPHILIA  Blood Culture ID Panel (Reflexed)     Status: Abnormal   Collection Time: 08/30/20 10:50 PM  Result Value Ref Range Status   Enterococcus faecalis NOT DETECTED NOT DETECTED Final   Enterococcus Faecium NOT DETECTED NOT DETECTED Final   Listeria monocytogenes NOT DETECTED NOT DETECTED Final   Staphylococcus species NOT DETECTED NOT DETECTED Final   Staphylococcus aureus (BCID) NOT DETECTED NOT DETECTED Final   Staphylococcus epidermidis NOT DETECTED NOT DETECTED Final   Staphylococcus lugdunensis NOT DETECTED NOT DETECTED Final   Streptococcus species NOT DETECTED NOT DETECTED Final   Streptococcus agalactiae NOT DETECTED NOT DETECTED Final   Streptococcus pneumoniae NOT DETECTED NOT DETECTED Final   Streptococcus pyogenes NOT DETECTED NOT DETECTED Final   A.calcoaceticus-baumannii NOT DETECTED NOT DETECTED Final   Bacteroides fragilis NOT DETECTED NOT DETECTED Final   Enterobacterales NOT DETECTED NOT DETECTED Final   Enterobacter cloacae complex NOT DETECTED NOT DETECTED Final   Escherichia coli NOT DETECTED NOT DETECTED Final   Klebsiella aerogenes NOT DETECTED NOT DETECTED Final   Klebsiella oxytoca NOT DETECTED NOT DETECTED Final   Klebsiella pneumoniae NOT DETECTED NOT DETECTED Final   Proteus species NOT DETECTED NOT DETECTED Final   Salmonella species NOT DETECTED NOT DETECTED Final   Serratia marcescens NOT DETECTED NOT DETECTED Final   Haemophilus influenzae NOT DETECTED NOT DETECTED Final   Neisseria meningitidis NOT DETECTED NOT DETECTED Final   Pseudomonas aeruginosa NOT DETECTED NOT DETECTED  Final   Stenotrophomonas maltophilia DETECTED (A) NOT DETECTED Final    Comment: CRITICAL RESULT CALLED TO, READ BACK BY AND VERIFIED WITH: L ADKINS RN 08/31/20 2114 JDW    Candida albicans NOT DETECTED NOT DETECTED Final   Candida auris NOT DETECTED NOT DETECTED Final   Candida glabrata NOT DETECTED NOT DETECTED Final   Candida krusei NOT DETECTED NOT DETECTED Final   Candida parapsilosis NOT DETECTED NOT DETECTED Final   Candida tropicalis NOT DETECTED NOT DETECTED Final   Cryptococcus neoformans/gattii NOT DETECTED NOT DETECTED Final    Comment: Performed at [facility], [address], [place], [place] [address]  Respiratory Panel by RT PCR (Flu A&B, Covid) - Nasopharyngeal Swab     Status: None   Collection Time: 08/31/20 12:03 AM   Specimen: Nasopharyngeal Swab  Result Value Ref Range Status   SARS Coronavirus 2 by RT PCR NEGATIVE NEGATIVE Final    Comment: (NOTE) SARS-CoV-2 target nucleic acids are NOT DETECTED.  The SARS-CoV-2 RNA is generally detectable in upper respiratoy specimens during the acute phase of infection. The lowest concentration of SARS-CoV-2 viral copies this assay can detect is 131 copies/mL. A negative result does not preclude SARS-Cov-2 infection and should not be used as the sole basis for treatment or other patient management decisions. A negative result may occur with  improper specimen collection/handling, submission of specimen other than nasopharyngeal swab, presence of viral mutation(s) within the areas targeted by this assay, and inadequate number of viral copies (<131 copies/mL). A negative result must be combined with clinical observations, patient history, and epidemiological information. The expected result is Negative.  Fact Sheet for Patients:  PinkCheek.be  Fact Sheet for Healthcare Providers:  GravelBags.it  This test is no t yet approved or cleared by the [place]  FDA and  has been authorized for detection and/or diagnosis of SARS-CoV-2 by FDA under an Emergency Use Authorization (EUA). This EUA will remain  in effect (meaning this test can be used) for the duration of the COVID-19 declaration under Section 564(b)(1) of the Act, 21 U.S.C. section 360bbb-3(b)(1), unless the authorization is terminated or revoked sooner.  Influenza A by PCR NEGATIVE NEGATIVE Final   Influenza B by PCR NEGATIVE NEGATIVE Final    Comment: (NOTE) The Xpert Xpress SARS-CoV-2/FLU/RSV assay is intended as an aid in  the diagnosis of influenza from Nasopharyngeal swab specimens and  should not be used as a sole basis for treatment. Nasal washings and  aspirates are unacceptable for Xpert Xpress SARS-CoV-2/FLU/RSV  testing.  Fact Sheet for Patients: PinkCheek.be  Fact Sheet for Healthcare Providers: GravelBags.it  This test is not yet approved or cleared by the [place] FDA and  has been authorized for detection and/or diagnosis of SARS-CoV-2 by  FDA under an Emergency Use Authorization (EUA). This EUA will remain  in effect (meaning this test can be used) for the duration of the  Covid-19 declaration under Section 564(b)(1) of the Act, 21  U.S.C. section 360bbb-3(b)(1), unless the authorization is  terminated or revoked. Performed at [facility], [place] [address] [place], [place] [address]   Urine culture     Status: Abnormal   Collection Time: 08/31/20  3:30 AM   Specimen: In/Out Cath Urine  Result Value Ref Range Status   Specimen Description   Final    IN/OUT CATH URINE Performed at [facility] [address] [place], [place] [address]    Special Requests   Final    NONE Performed at [facility], [place] [address], [place], [place] [address]    Culture (A)  Final    100 COLONIES/mL STAPHYLOCOCCUS EPIDERMIDIS 100 COLONIES/mL  STAPHYLOCOCCUS WARNERI    Report Status 09/02/2020 FINAL  Final   Organism ID, Bacteria STAPHYLOCOCCUS EPIDERMIDIS (A)  Final   Organism ID, Bacteria STAPHYLOCOCCUS WARNERI (A)  Final      Susceptibility   Staphylococcus epidermidis - MIC*    CIPROFLOXACIN >=8 RESISTANT Resistant     GENTAMICIN 8 INTERMEDIATE Intermediate     NITROFURANTOIN <=16 SENSITIVE Sensitive     OXACILLIN >=4 RESISTANT Resistant     TETRACYCLINE <=1 SENSITIVE Sensitive     VANCOMYCIN 2 SENSITIVE Sensitive     TRIMETH/SULFA 80 RESISTANT Resistant     CLINDAMYCIN >=8 RESISTANT Resistant     RIFAMPIN <=0.5 SENSITIVE Sensitive     Inducible Clindamycin NEGATIVE Sensitive     * 100 COLONIES/mL STAPHYLOCOCCUS EPIDERMIDIS   Staphylococcus warneri - MIC*    CIPROFLOXACIN <=0.5 SENSITIVE Sensitive     GENTAMICIN <=0.5 SENSITIVE Sensitive     NITROFURANTOIN 32 SENSITIVE Sensitive     OXACILLIN <=0.25 SENSITIVE Sensitive     TETRACYCLINE <=1 SENSITIVE Sensitive     VANCOMYCIN 1 SENSITIVE Sensitive     TRIMETH/SULFA <=10 SENSITIVE Sensitive     CLINDAMYCIN >=8 RESISTANT Resistant     RIFAMPIN <=0.5 SENSITIVE Sensitive     Inducible Clindamycin NEGATIVE Sensitive     * 100 COLONIES/mL STAPHYLOCOCCUS WARNERI  Blood Culture (routine x 2)     Status: Abnormal (Preliminary result)   Collection Time: 08/31/20 11:35 PM   Specimen: BLOOD  Result Value Ref Range Status   Specimen Description   Final    BLOOD PICC LINE Performed at [facility], [place] [address], [place], [place] [address]    Special Requests   Final    BOTTLES DRAWN AEROBIC AND ANAEROBIC Blood Culture adequate volume Performed at [facility] [address] [place], [place] [address]    Culture  Setup Time   Final    GRAM NEGATIVE RODS AEROBIC BOTTLE ONLY CRITICAL VALUE  NOTED.  VALUE IS CONSISTENT WITH PREVIOUSLY REPORTED AND CALLED VALUE. GRAM POSITIVE RODS ANAEROBIC BOTTLE ONLY CRITICAL RESULT CALLED TO, READ  BACK BY AND VERIFIED WITH: PHARMD [place] 2956 09/06/20 FCP    Culture (A)  Final    STENOTROPHOMONAS MALTOPHILIA SUSCEPTIBILITIES PERFORMED ON PREVIOUS CULTURE WITHIN THE LAST 5 DAYS. HOLDING FOR POSSIBLE ANAEROBE Performed at [facility], [place] [address] [place], [place] [address]    Report Status PENDING  Incomplete  Respiratory Panel by RT PCR (Flu A&B, Covid) - Nasopharyngeal Swab     Status: None   Collection Time: 09/01/20  3:34 AM   Specimen: Nasopharyngeal Swab  Result Value Ref Range Status   SARS Coronavirus 2 by RT PCR NEGATIVE NEGATIVE Final    Comment: (NOTE) SARS-CoV-2 target nucleic acids are NOT DETECTED.  The SARS-CoV-2 RNA is generally detectable in upper respiratoy specimens during the acute phase of infection. The lowest concentration of SARS-CoV-2 viral copies this assay can detect is 131 copies/mL. A negative result does not preclude SARS-Cov-2 infection and should not be used as the sole basis for treatment or other patient management decisions. A negative result may occur with  improper specimen collection/handling, submission of specimen other than nasopharyngeal swab, presence of viral mutation(s) within the areas targeted by this assay, and inadequate number of viral copies (<131 copies/mL). A negative result must be combined with clinical observations, patient history, and epidemiological information. The expected result is Negative.  Fact Sheet for Patients:  PinkCheek.be  Fact Sheet for Healthcare Providers:  GravelBags.it  This test is no t yet approved or cleared by the [place] FDA and  has been authorized for detection and/or diagnosis of SARS-CoV-2 by FDA under an Emergency Use Authorization (EUA). This EUA will remain  in effect (meaning this test can be used) for the duration of the COVID-19 declaration under Section 564(b)(1) of the Act, 21 U.S.C. section  360bbb-3(b)(1), unless the authorization is terminated or revoked sooner.     Influenza A by PCR NEGATIVE NEGATIVE Final   Influenza B by PCR NEGATIVE NEGATIVE Final    Comment: (NOTE) The Xpert Xpress SARS-CoV-2/FLU/RSV assay is intended as an aid in  the diagnosis of influenza from Nasopharyngeal swab specimens and  should not be used as a sole basis for treatment. Nasal washings and  aspirates are unacceptable for Xpert Xpress SARS-CoV-2/FLU/RSV  testing.  Fact Sheet for Patients: PinkCheek.be  Fact Sheet for Healthcare Providers: GravelBags.it  This test is not yet approved or cleared by the [place] FDA and  has been authorized for detection and/or diagnosis of SARS-CoV-2 by  FDA under an Emergency Use Authorization (EUA). This EUA will remain  in effect (meaning this test can be used) for the duration of the  Covid-19 declaration under Section 564(b)(1) of the Act, 21  U.S.C. section 360bbb-3(b)(1), unless the authorization is  terminated or revoked. Performed at [facility], [place] [address] [place], [place] [address]   Blood Culture (routine x 2)     Status: None   Collection Time: 09/01/20  5:58 AM   Specimen: BLOOD LEFT HAND  Result Value Ref Range Status   Specimen Description   Final    BLOOD LEFT HAND Performed at [facility] [address] [place], [place] [address]    Special Requests   Final    BOTTLES DRAWN AEROBIC ONLY Blood Culture adequate volume Performed at [facility] [address], [place], [place] [address]  Culture   Final    NO GROWTH 5 DAYS Performed at [facility], [place] [address] [place],  [address]    Report Status 09/06/2020 FINAL  Final     Imaging: 10/13 abd pelv ct no contrast 1. There are signs of multiple bowel resections involving both large and small bowel loops with resultant complex  bowel anatomy. Evaluation of the bowel loops is further complicated by lack of IV and oral contrast material. Several dilated loops of small bowel are noted in the right abdomen with air-fluid levels. Cannot exclude partial small bowel obstruction. If there is a continued concern for bowel obstruction and location of possible transition point is of clinical concern then follow-up imaging after administration of oral and IV contrast material is recommended. 2. Large stool burden identified within the distal transverse colon, splenic flexure and descending colon. There are several intervening segments of gas distended colon between stool filled loops of distal colon. Findings may reflect constipation. 3. Pneumobilia compatible with biliary patency. 4. Right-sided central venous catheter with the tip in a branch of the hepatic vein. 5. Aortic atherosclerosis.  Jabier Mutton, [facility] [place] [phone] pager    09/08/2020, 10:23 AM

## 2020-09-08 NOTE — Progress Notes (Signed)
PROGRESS NOTE    Caitlin Peters   QIW:[id number]  DOB: 10-18-1961  DOA: 08/31/2020     7  PCP: Caitlin Peters, Caitlin Halsted, MD  CC: fever, chills   Hospital Course: 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 She also has had multiple PICC line infections and has had to have several removed from throughout her body over the years and has multiple scars to show where previous lines have been.  She had her most recent PICC placed in the right femoral vein on 08/05/20. She is admitted to the hospital on 09/01/20 with another bacteremia (Stenotrophomonas) and was started on Bactrim, but developed AKI and was then changed to Levaquin.   Furthermore, on 09/07/20 she developed acute abdominal pain on top of her chronic pain she lives with. She was unable to tolerate pills nor oral contrast and due to resolving AKI did not receive IV contrast either; but a CT abd/pelvis was obtained which revealed concern for underlying SBO. Surgery was consulted for further evaluation and assistance given her complex underlying history.  After evaluation, the patient decided she did not want any further surgeries especially if not emergently needed.  It was decided to manage her supportively and if she developed any further nausea and/or vomiting, then to place NG tube.   Interval History:  Still in pain this am but overall more comfortable than she was yesterday. She is tearful but pain is better some. Still declining palliative care or similar; wants to be aggressive.   Old records reviewed in assessment of this patient  ROS: Constitutional: positive for anorexia, fatigue, malaise and weight loss, Respiratory: negative for cough, Cardiovascular: negative for chest pain and Gastrointestinal: positive for abdominal pain and nausea  Assessment & Plan: * Bacteremia associated  with intravascular line ([place]) - right femoral PICC placed 08/05/20; now with Stenotrophomonas bacteremia; ID following - did not tolerate Bactrim due to AKI, now on Levaquin - patient has had recurrent infections/bacteremias; we are attempting to treat with PICC in place; clinically she's at least tolerating and responding fairly well (e.g. no fevers, she's chronically neutropenic so difficult to even know if sign of sepsis for her, but her differential is relatively reassuring) - tentative plan is to leave PICC in place and continue Levaquin until 10/21 for 14 day course - blood culture from 10/7 is negative  - if fever repeat blood cultures (PICC and PIV) - outpatient ID f/u on 10/25  Abdominal pain - CT abdomen/pelvis performed on 09/07/2020 reveals possible obstruction.  Her underlying anatomy complicates the picture with interpretation.  Greatly appreciate surgery assistance in the management of her care.  Supportive and symptomatic management for now with advancing diet slowly, pain control, and antiemetics as needed.  If she does develop worsening nausea or vomiting, we may place an NG tube down and back off on diet once again.  This is mainly what she does at home as well when feeling more pain or nausea -Patient has declined any further surgeries at this time as well.  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 Patient relocated from [place] to [place] to be closer to family - for now,  continue TPN for short gut syndrome - long term prognosis is poor (multiple hospitalizations, infections, weight loss/malnutrition, chronic pain, poor quality of life)  Acute renal failure superimposed on stage 3b chronic kidney disease ([place]) - baseline probably close to 1.5 creatinine; mild increase from Bactrim which was changed to Levaquin for above infection - creatinine continues to climb; possibly component of ATN - continue IVF when TPN is off and trend BMP  On total parenteral nutrition (TPN) - R femoral PICC in place  - continue TPN  Abnormal LFTs - possibly related to TPN as well - intermittent CMP  Short gut syndrome -See Crohn's disease  Sepsis ([name])-resolved as of 09/07/2020 - see bacteremia   Antimicrobials: Bactrim 09/01/2020>> 09/06/2020 Levaquin 09/06/2020>> present  DVT prophylaxis: SCD Code Status: Full Family Communication: none present Disposition Plan: Status is: Inpatient  Remains inpatient appropriate because:Ongoing active pain requiring inpatient pain management, Unsafe d/c plan, IV treatments appropriate due to intensity of illness or inability to take PO and Inpatient level of care appropriate due to severity of illness   Dispo: The patient is from: Home              Anticipated d/c is to: Home              Anticipated d/c date is: > 3 days              Patient currently is not medically stable to d/c.   Objective: Blood pressure (!) 168/75, pulse 79, temperature 98.9 F (37.2 C), temperature source Oral, resp. rate 16, height 5' 8"  (1.727 m), weight 63.5 kg, SpO2 98 %.  Examination: General appearance: more comfortable appearing woman laying in bed in no distress but still in pain Head: Normocephalic, without obvious abnormality, atraumatic Eyes: EOMI Lungs: clear to auscultation bilaterally Heart: regular rate and rhythm and S1, S2 normal Abdomen: Large midline abdominal scar noted from multiple prior surgeries.  Hypoactive bowel sounds.   Immediate guarding on exam with diffuse tenderness throughout Extremities: Thin, no edema Skin: dry, scaly Neurologic: Grossly normal  Consultants:   ID  Surgery  Procedures:   n/a  Data Reviewed: I have personally reviewed following labs and imaging studies Results for orders placed or performed during the hospital encounter of 08/31/20 (from the past 24 hour(s))  Glucose, capillary     Status: Abnormal   Collection Time: 09/07/20 10:48 PM  Result Value Ref Range   Glucose-Capillary 134 (H) 70 - 99 mg/dL  Magnesium     Status: None   Collection Time: 09/08/20  4:48 AM  Result Value Ref Range   Magnesium 1.9 1.7 - 2.4 mg/dL  Phosphorus     Status: Abnormal   Collection Time: 09/08/20  4:48 AM  Result Value Ref Range   Phosphorus 4.8 (H) 2.5 - 4.6 mg/dL  Comprehensive metabolic panel     Status: Abnormal   Collection Time: 09/08/20  4:48 AM  Result Value Ref Range   Sodium 132 (L) 135 - 145 mmol/L   Potassium 4.6 3.5 - 5.1 mmol/L   Chloride 105 98 - 111 mmol/L   CO2 19 (L) 22 - 32 mmol/L   Glucose, Bld 126 (H) 70 - 99 mg/dL   BUN  53 (H) 6 - 20 mg/dL   Creatinine, Ser 2.02 (H) 0.44 - 1.00 mg/dL   Calcium 9.4 8.9 - 10.3 mg/dL   Total Protein 7.1 6.5 - 8.1 g/dL   Albumin 3.2 (L) 3.5 - 5.0 g/dL   AST 31 15 - 41 U/L   ALT 89 (H) 0 - 44 U/L   Alkaline Phosphatase 206 (H) 38 - 126 U/L   Total Bilirubin 0.5 0.3 - 1.2 mg/dL   GFR, Estimated 26 (L) >60 mL/min   Anion gap 8 5 - 15  CBC with Differential/Platelet     Status: Abnormal   Collection Time: 09/08/20  4:48 AM  Result Value Ref Range   WBC 4.9 4.0 - 10.5 K/uL   RBC 3.15 (L) 3.87 - 5.11 MIL/uL   Hemoglobin 9.4 (L) 12.0 - 15.0 g/dL   HCT 28.3 (L) 36 - 46 %   MCV 89.8 80.0 - 100.0 fL   MCH 29.8 26.0 - 34.0 pg   MCHC 33.2 30.0 - 36.0 g/dL   RDW 14.0 11.5 - 15.5 %   Platelets 206 150 - 400 K/uL   nRBC 0.0 0.0 - 0.2 %   Neutrophils Relative % 54 %   Neutro Abs 2.7 1.7 - 7.7 K/uL   Lymphocytes Relative 39 %    Lymphs Abs 1.9 0.7 - 4.0 K/uL   Monocytes Relative 6 %   Monocytes Absolute 0.3 0.1 - 1.0 K/uL   Eosinophils Relative 1 %   Eosinophils Absolute 0.0 0.0 - 0.5 K/uL   Basophils Relative 0 %   Basophils Absolute 0.0 0.0 - 0.1 K/uL   Immature Granulocytes 0 %   Abs Immature Granulocytes 0.02 0.00 - 0.07 K/uL  Glucose, capillary     Status: Abnormal   Collection Time: 09/08/20  6:03 AM  Result Value Ref Range   Glucose-Capillary 138 (H) 70 - 99 mg/dL    Recent Results (from the past 240 hour(s))  Blood culture (routine x 2)     Status: Abnormal   Collection Time: 08/30/20 10:50 PM   Specimen: BLOOD  Result Value Ref Range Status   Specimen Description   Final    BLOOD CENTRAL LINE Performed at [facility], [address], [place], [place] [address]    Special Requests   Final    BOTTLES DRAWN AEROBIC AND ANAEROBIC Blood Culture adequate volume Performed at [facility] [address], [place],  [address]    Culture  Setup Time   Final    GRAM NEGATIVE RODS AEROBIC BOTTLE ONLY CRITICAL RESULT CALLED TO, READ BACK BY AND VERIFIED WITH: L ADKINS RN 08/31/20 2114 JDW Performed at [facility], [place] [address], [place],  [address]    Culture STENOTROPHOMONAS MALTOPHILIA (A)  Final   Report Status 09/02/2020 FINAL  Final   Organism ID, Bacteria STENOTROPHOMONAS MALTOPHILIA  Final      Susceptibility   Stenotrophomonas maltophilia - MIC*    LEVOFLOXACIN 0.5 SENSITIVE Sensitive     TRIMETH/SULFA <=20 SENSITIVE Sensitive     * STENOTROPHOMONAS MALTOPHILIA  Blood Culture ID Panel (Reflexed)     Status: Abnormal   Collection Time: 08/30/20 10:50 PM  Result Value Ref Range Status   Enterococcus faecalis NOT DETECTED NOT DETECTED Final   Enterococcus Faecium NOT DETECTED NOT DETECTED Final   Listeria monocytogenes NOT DETECTED NOT DETECTED Final   Staphylococcus species NOT DETECTED NOT DETECTED Final   Staphylococcus aureus  (BCID) NOT DETECTED NOT  DETECTED Final   Staphylococcus epidermidis NOT DETECTED NOT DETECTED Final   Staphylococcus lugdunensis NOT DETECTED NOT DETECTED Final   Streptococcus species NOT DETECTED NOT DETECTED Final   Streptococcus agalactiae NOT DETECTED NOT DETECTED Final   Streptococcus pneumoniae NOT DETECTED NOT DETECTED Final   Streptococcus pyogenes NOT DETECTED NOT DETECTED Final   A.calcoaceticus-baumannii NOT DETECTED NOT DETECTED Final   Bacteroides fragilis NOT DETECTED NOT DETECTED Final   Enterobacterales NOT DETECTED NOT DETECTED Final   Enterobacter cloacae complex NOT DETECTED NOT DETECTED Final   Escherichia coli NOT DETECTED NOT DETECTED Final   Klebsiella aerogenes NOT DETECTED NOT DETECTED Final   Klebsiella oxytoca NOT DETECTED NOT DETECTED Final   Klebsiella pneumoniae NOT DETECTED NOT DETECTED Final   Proteus species NOT DETECTED NOT DETECTED Final   Salmonella species NOT DETECTED NOT DETECTED Final   Serratia marcescens NOT DETECTED NOT DETECTED Final   Haemophilus influenzae NOT DETECTED NOT DETECTED Final   Neisseria meningitidis NOT DETECTED NOT DETECTED Final   Pseudomonas aeruginosa NOT DETECTED NOT DETECTED Final   Stenotrophomonas maltophilia DETECTED (A) NOT DETECTED Final    Comment: CRITICAL RESULT CALLED TO, READ BACK BY AND VERIFIED WITH: L ADKINS RN 08/31/20 2114 JDW    Candida albicans NOT DETECTED NOT DETECTED Final   Candida auris NOT DETECTED NOT DETECTED Final   Candida glabrata NOT DETECTED NOT DETECTED Final   Candida krusei NOT DETECTED NOT DETECTED Final   Candida parapsilosis NOT DETECTED NOT DETECTED Final   Candida tropicalis NOT DETECTED NOT DETECTED Final   Cryptococcus neoformans/gattii NOT DETECTED NOT DETECTED Final    Comment: Performed at [facility], [address], [place], [place] [address]  Respiratory Panel by RT PCR (Flu A&B, Covid) - Nasopharyngeal Swab     Status: None   Collection Time: 08/31/20 12:03 AM    Specimen: Nasopharyngeal Swab  Result Value Ref Range Status   SARS Coronavirus 2 by RT PCR NEGATIVE NEGATIVE Final    Comment: (NOTE) SARS-CoV-2 target nucleic acids are NOT DETECTED.  The SARS-CoV-2 RNA is generally detectable in upper respiratoy specimens during the acute phase of infection. The lowest concentration of SARS-CoV-2 viral copies this assay can detect is 131 copies/mL. A negative result does not preclude SARS-Cov-2 infection and should not be used as the sole basis for treatment or other patient management decisions. A negative result may occur with  improper specimen collection/handling, submission of specimen other than nasopharyngeal swab, presence of viral mutation(s) within the areas targeted by this assay, and inadequate number of viral copies (<131 copies/mL). A negative result must be combined with clinical observations, patient history, and epidemiological information. The expected result is Negative.  Fact Sheet for Patients:  PinkCheek.be  Fact Sheet for Healthcare Providers:  GravelBags.it  This test is no t yet approved or cleared by the [place] FDA and  has been authorized for detection and/or diagnosis of SARS-CoV-2 by FDA under an Emergency Use Authorization (EUA). This EUA will remain  in effect (meaning this test can be used) for the duration of the COVID-19 declaration under Section 564(b)(1) of the Act, 21 U.S.C. section 360bbb-3(b)(1), unless the authorization is terminated or revoked sooner.     Influenza A by PCR NEGATIVE NEGATIVE Final   Influenza B by PCR NEGATIVE NEGATIVE Final    Comment: (NOTE) The Xpert Xpress SARS-CoV-2/FLU/RSV assay is intended as an aid in  the diagnosis of influenza from Nasopharyngeal swab specimens and  should not be used as  a sole basis for treatment. Nasal washings and  aspirates are unacceptable for Xpert Xpress SARS-CoV-2/FLU/RSV   testing.  Fact Sheet for Patients: PinkCheek.be  Fact Sheet for Healthcare Providers: GravelBags.it  This test is not yet approved or cleared by the [place] FDA and  has been authorized for detection and/or diagnosis of SARS-CoV-2 by  FDA under an Emergency Use Authorization (EUA). This EUA will remain  in effect (meaning this test can be used) for the duration of the  Covid-19 declaration under Section 564(b)(1) of the Act, 21  U.S.C. section 360bbb-3(b)(1), unless the authorization is  terminated or revoked. Performed at [facility], [place] [address] [place], [place] [address]   Urine culture     Status: Abnormal   Collection Time: 08/31/20  3:30 AM   Specimen: In/Out Cath Urine  Result Value Ref Range Status   Specimen Description   Final    IN/OUT CATH URINE Performed at [facility] [address] [place], [place] [address]    Special Requests   Final    NONE Performed at [facility], [place] [address] [place], [place] [address]    Culture (A)  Final    100 COLONIES/mL STAPHYLOCOCCUS EPIDERMIDIS 100 COLONIES/mL STAPHYLOCOCCUS WARNERI    Report Status 09/02/2020 FINAL  Final   Organism ID, Bacteria STAPHYLOCOCCUS EPIDERMIDIS (A)  Final   Organism ID, Bacteria STAPHYLOCOCCUS WARNERI (A)  Final      Susceptibility   Staphylococcus epidermidis - MIC*    CIPROFLOXACIN >=8 RESISTANT Resistant     GENTAMICIN 8 INTERMEDIATE Intermediate     NITROFURANTOIN <=16 SENSITIVE Sensitive     OXACILLIN >=4 RESISTANT Resistant     TETRACYCLINE <=1 SENSITIVE Sensitive     VANCOMYCIN 2 SENSITIVE Sensitive     TRIMETH/SULFA 80 RESISTANT Resistant     CLINDAMYCIN >=8 RESISTANT Resistant     RIFAMPIN <=0.5 SENSITIVE Sensitive     Inducible Clindamycin NEGATIVE Sensitive     * 100 COLONIES/mL STAPHYLOCOCCUS EPIDERMIDIS   Staphylococcus warneri - MIC*     CIPROFLOXACIN <=0.5 SENSITIVE Sensitive     GENTAMICIN <=0.5 SENSITIVE Sensitive     NITROFURANTOIN 32 SENSITIVE Sensitive     OXACILLIN <=0.25 SENSITIVE Sensitive     TETRACYCLINE <=1 SENSITIVE Sensitive     VANCOMYCIN 1 SENSITIVE Sensitive     TRIMETH/SULFA <=10 SENSITIVE Sensitive     CLINDAMYCIN >=8 RESISTANT Resistant     RIFAMPIN <=0.5 SENSITIVE Sensitive     Inducible Clindamycin NEGATIVE Sensitive     * 100 COLONIES/mL STAPHYLOCOCCUS WARNERI  Blood Culture (routine x 2)     Status: Abnormal (Preliminary result)   Collection Time: 08/31/20 11:35 PM   Specimen: BLOOD  Result Value Ref Range Status   Specimen Description   Final    BLOOD PICC LINE Performed at [facility], [place] [address] [place], [place] [address]    Special Requests   Final    BOTTLES DRAWN AEROBIC AND ANAEROBIC Blood Culture adequate volume Performed at [facility] [address], [place], [place] [address]    Culture  Setup Time   Final    GRAM NEGATIVE RODS AEROBIC BOTTLE ONLY CRITICAL VALUE NOTED.  VALUE IS CONSISTENT WITH PREVIOUSLY REPORTED AND CALLED VALUE. GRAM POSITIVE RODS ANAEROBIC BOTTLE ONLY CRITICAL RESULT CALLED TO, READ BACK BY AND VERIFIED WITH: PHARMD Cantril 2355 09/06/20 FCP    Culture (A)  Final    STENOTROPHOMONAS MALTOPHILIA SUSCEPTIBILITIES PERFORMED  ON PREVIOUS CULTURE WITHIN THE LAST 5 DAYS. HOLDING FOR POSSIBLE ANAEROBE Performed at [facility], [place] [address] [place], [place] [address]    Report Status PENDING  Incomplete  Respiratory Panel by RT PCR (Flu A&B, Covid) - Nasopharyngeal Swab     Status: None   Collection Time: 09/01/20  3:34 AM   Specimen: Nasopharyngeal Swab  Result Value Ref Range Status   SARS Coronavirus 2 by RT PCR NEGATIVE NEGATIVE Final    Comment: (NOTE) SARS-CoV-2 target nucleic acids are NOT DETECTED.  The SARS-CoV-2 RNA is generally detectable in upper respiratoy specimens during the acute  phase of infection. The lowest concentration of SARS-CoV-2 viral copies this assay can detect is 131 copies/mL. A negative result does not preclude SARS-Cov-2 infection and should not be used as the sole basis for treatment or other patient management decisions. A negative result may occur with  improper specimen collection/handling, submission of specimen other than nasopharyngeal swab, presence of viral mutation(s) within the areas targeted by this assay, and inadequate number of viral copies (<131 copies/mL). A negative result must be combined with clinical observations, patient history, and epidemiological information. The expected result is Negative.  Fact Sheet for Patients:  PinkCheek.be  Fact Sheet for Healthcare Providers:  GravelBags.it  This test is no t yet approved or cleared by the [place] FDA and  has been authorized for detection and/or diagnosis of SARS-CoV-2 by FDA under an Emergency Use Authorization (EUA). This EUA will remain  in effect (meaning this test can be used) for the duration of the COVID-19 declaration under Section 564(b)(1) of the Act, 21 U.S.C. section 360bbb-3(b)(1), unless the authorization is terminated or revoked sooner.     Influenza A by PCR NEGATIVE NEGATIVE Final   Influenza B by PCR NEGATIVE NEGATIVE Final    Comment: (NOTE) The Xpert Xpress SARS-CoV-2/FLU/RSV assay is intended as an aid in  the diagnosis of influenza from Nasopharyngeal swab specimens and  should not be used as a sole basis for treatment. Nasal washings and  aspirates are unacceptable for Xpert Xpress SARS-CoV-2/FLU/RSV  testing.  Fact Sheet for Patients: PinkCheek.be  Fact Sheet for Healthcare Providers: GravelBags.it  This test is not yet approved or cleared by the [place] FDA and  has been authorized for detection and/or diagnosis of  SARS-CoV-2 by  FDA under an Emergency Use Authorization (EUA). This EUA will remain  in effect (meaning this test can be used) for the duration of the  Covid-19 declaration under Section 564(b)(1) of the Act, 21  U.S.C. section 360bbb-3(b)(1), unless the authorization is  terminated or revoked. Performed at [facility], [place] [address], [place], [place] [address]   Blood Culture (routine x 2)     Status: None   Collection Time: 09/01/20  5:58 AM   Specimen: BLOOD LEFT HAND  Result Value Ref Range Status   Specimen Description   Final    BLOOD LEFT HAND Performed at [facility] [address] [place], [place] [address]    Special Requests   Final    BOTTLES DRAWN AEROBIC ONLY Blood Culture adequate volume Performed at [facility] [address] [place],  [address]    Culture   Final    NO GROWTH 5 DAYS Performed at [facility], [place] [address] [place],  [address]    Report Status 09/06/2020 FINAL  Final     Radiology Studies: CT ABDOMEN PELVIS WO CONTRAST  Result Date: 09/07/2020 CLINICAL DATA:  History of Crohn's disease. Abdominal pain. Rule out small bowel obstruction. Progressive nausea and vomiting EXAM: CT ABDOMEN AND PELVIS WITHOUT CONTRAST TECHNIQUE: Multidetector CT imaging of the abdomen and pelvis was performed following the standard protocol without IV contrast. COMPARISON:  08/31/2020 FINDINGS: Lower chest: No acute findings Hepatobiliary: Within the limitations of unenhanced technique no suspicious liver abnormality identified. Pneumobilia is identified compatible with biliary patency. Previous cholecystectomy. Pancreas: Similar appearance of mild increase caliber of the main pancreatic duct. No signs of pancreatic inflammation or mass. Spleen: Normal in size without focal abnormality. Adrenals/Urinary Tract: Normal appearance of the adrenal glands. The kidneys are unremarkable. No mass or  hydronephrosis identified. Moderate bladder distension. No focal bladder abnormality. Stomach/Bowel: The stomach appears unremarkable. There are signs of multiple bowel resections involving both large and small bowel loops with resultant complex anatomy. Evaluation of the bowel loops is further complicated by lack of IV and oral contrast material. Must of the small bowel loops are identified within the right hemiabdomen and measure up to 3.7 cm in maximum diameter. Diminished caliber small bowel loops are noted within the central abdomen. The patient appears to be status post right hemicolectomy. There is gaseous distension of the distal transverse colon beyond the level of the enterocolonic anastomosis. A large stool burden identified within the distal transverse colon, splenic flexure and descending colon. There are several intervening segments of gas distended colon are noted between stool filled loops of distal colon. Vascular/Lymphatic: There is a right-sided central venous catheter with the tip in a branch of the hepatic vein. Mild aortic atherosclerosis. No aneurysm. No abdominopelvic adenopathy. Reproductive: Status post hysterectomy. No adnexal masses. Other: No free fluid or fluid collections Musculoskeletal: No acute or significant osseous findings. IMPRESSION: 1. There are signs of multiple bowel resections involving both large and small bowel loops with resultant complex bowel anatomy. Evaluation of the bowel loops is further complicated by lack of IV and oral contrast material. Several dilated loops of small bowel are noted in the right abdomen with air-fluid levels. Cannot exclude partial small bowel obstruction. If there is a continued concern for bowel obstruction and location of possible transition point is of clinical concern then follow-up imaging after administration of oral and IV contrast material is recommended. 2. Large stool burden identified within the distal transverse colon, splenic  flexure and descending colon. There are several intervening segments of gas distended colon between stool filled loops of distal colon. Findings may reflect constipation. 3. Pneumobilia compatible with biliary patency. 4. Right-sided central venous catheter with the tip in a branch of the hepatic vein. 5. Aortic atherosclerosis. Aortic Atherosclerosis (ICD10-I70.0). Electronically Signed   By: Kerby Moors M.D.   On: 09/07/2020 15:05   CT ABDOMEN PELVIS WO CONTRAST  Final Result      Scheduled Meds: . (feeding supplement) PROSource Plus  30 mL Oral BID BM  . amLODipine  10 mg Oral Daily  . budesonide  3 mg Oral Daily  . buPROPion  200 mg Oral Daily  . Chlorhexidine Gluconate Cloth  6 each Topical Daily  . cholecalciferol  1,000 Units Oral Daily  . cycloSPORINE  1 drop Both Eyes BID  . dicyclomine  20 mg Oral TID  . DULoxetine  90 mg Oral Daily  . estradiol  2 mg Oral Daily  . famotidine  20 mg Oral QHS  . feeding supplement ([employer] STANDARD 1.4)  325 mL Oral Daily  . hydrALAZINE  50 mg  Oral TID  . metoprolol tartrate  100 mg Oral BID  . multivitamin  15 mL Oral Daily  . omega-3 acid ethyl esters  1,000 mg Oral Daily  . pantoprazole  40 mg Oral Daily  . polyvinyl alcohol  1 drop Both Eyes QID  . senna-docusate  1 tablet Oral BID  . sodium chloride flush  10-40 mL Intracatheter Q12H  . sucralfate  1 g Oral TID WC & HS  . vitamin B-12  1,000 mcg Oral Daily   PRN Meds: calcium carbonate, hydrALAZINE, HYDROmorphone (DILAUDID) injection, labetalol, lipase/protease/amylase, ondansetron (ZOFRAN) IV, promethazine, promethazine, sodium chloride flush, traZODone Continuous Infusions: . [START ON 09/09/2020] lactated ringers    . lactated ringers 100 mL/hr at 09/08/20 1026  . levofloxacin (LEVAQUIN) IV    . TPN CYCLIC-ADULT (ION)        LOS: 7 days  Time spent: Greater than 50% of the 35 minute visit was spent in counseling/coordination of care for the patient as laid out in the  A&P.   Dwyane Dee, MD [facility] 09/08/2020, 12:16 PM

## 2020-09-08 NOTE — Progress Notes (Signed)
Nutrition Follow-up  INTERVENTION:   -TPN management per Pharmacy  Following diet advancement: -Resume [employer] 1.4 PO daily, each provides 455 kcals and 20g protein -Prosource Plus PO BID, each provides 100 kcals and 15g protein  NUTRITION DIAGNOSIS:   Inadequate oral intake related to altered GI function, nausea as evidenced by per patient/family report.  Ongoing.  GOAL:   Patient will meet greater than or equal to 90% of their needs  Not meeting now NPO  MONITOR:   PO intake, Supplement acceptance, Labs, Weight trends  ASSESSMENT:   59 y.o. female with medical history of Crohn's disease, stage 3 CKD, HTN, chronic anemia, and thrombocytopenia. She is followed by GI at [facility] and was recently admitted for sepsis and discharged home on 9/17. Of note, she has chronic abdominal pain and loose stools. In the ED, CT abdomen/pelvis showed no acute processes. Blood cultures were positive for 3 strains. She is on home TPN and has a R femoral PICC.  10/6: admitted 75/8: Cyclic TPN resumed via PICC 10/12: developed N/V 10/13: CT shows possible obstruction, made NPO  Pt now NPO. Surgery following. Pt stating she does not want any more surgeries.  Pt was accepting supplements prior to developing symptoms, will leave them ordered if diet is advanced.  Pt is still receiving Cyclic TPN (providing 8325 kcals and 75g protein).   Medications: Vitamin D, Lovaza, Liquid MVI, Senokot, Carafate, Vitamin B-12, IV Zofran, Phenergan  Lab reviewed: CBGs: 134-138 Low Na Elevated Phos Mg WNL  Diet Order:   Diet Order            Diet NPO time specified Except for: Ice Chips  Diet effective now                 EDUCATION NEEDS:   No education needs have been identified at this time  Skin:  Skin Assessment: Reviewed RN Assessment  Last BM:  10/13  Height:   Ht Readings from Last 1 Encounters:  09/01/20 5' 8"  (1.727 m)    Weight:   Wt Readings from Last 1 Encounters:   09/01/20 63.5 kg    BMI:  Body mass index is 21.29 kg/m.  Estimated Nutritional Needs:   Kcal:  1905-2100 kcal  Protein:  95-110 grams  Fluid:  >/= 2 L/day   Clayton Bibles, MS, RD, LDN Inpatient Clinical Dietitian Contact information available via Amion

## 2020-09-08 NOTE — Progress Notes (Signed)
[facility] Progress Note     Subjective: CC:  C/o nausea but no emesis. C/o upper abdominal pain, asking for pain meds. Taking pain medication q 2h. Denies BM overnight.  Objective: Vital signs in last 24 hours: Temp:  [98.2 F (36.8 C)-99 F (37.2 C)] 98.9 F (37.2 C) (10/14 0607) Pulse Rate:  [73-79] 79 (10/14 0607) Resp:  [16-18] 16 (10/14 0607) BP: (130-180)/(59-79) 168/75 (10/14 0607) SpO2:  [98 %-99 %] 98 % (10/14 0607) Last BM Date: 09/07/20  Intake/Output from previous day: 10/13 0701 - 10/14 0700 In: 2202.1 [I.V.:2202.1] Out: 2200 [Urine:2200] Intake/Output this shift: Total I/O In: 20 [I.V.:20] Out: -   PE: Gen:  Alert, NAD, pleasant Card:  Regular rate and rhythm, pedal pulses 2+ BL Pulm:  Normal effort, clear to auscultation bilaterally Abd: Soft, mild global abdominal tenderness without rebound or guarding, previous surgical incisions noted. Skin: warm and dry, no rashes  Psych: A&Ox3   Lab Results:  Recent Labs    09/08/20 0448  WBC 4.9  HGB 9.4*  HCT 28.3*  PLT 206   BMET Recent Labs    09/07/20 0456 09/08/20 0448  NA 129* 132*  K 4.3 4.6  CL 104 105  CO2 18* 19*  GLUCOSE 155* 126*  BUN 45* 53*  CREATININE 1.78* 2.02*  CALCIUM 10.0 9.4   PT/INR No results for input(s): LABPROT, INR in the last 72 hours. CMP     Component Value Date/Time   NA 132 (L) 09/08/2020 0448   K 4.6 09/08/2020 0448   CL 105 09/08/2020 0448   CO2 19 (L) 09/08/2020 0448   GLUCOSE 126 (H) 09/08/2020 0448   BUN 53 (H) 09/08/2020 0448   CREATININE 2.02 (H) 09/08/2020 0448   CALCIUM 9.4 09/08/2020 0448   PROT 7.1 09/08/2020 0448   ALBUMIN 3.2 (L) 09/08/2020 0448   AST 31 09/08/2020 0448   ALT 89 (H) 09/08/2020 0448   ALKPHOS 206 (H) 09/08/2020 0448   BILITOT 0.5 09/08/2020 0448   GFRNONAA 26 (L) 09/08/2020 0448   GFRAA 55 (L) 08/12/2020 0513   Lipase     Component Value Date/Time   LIPASE 23 09/07/2020 1136        Studies/Results: CT ABDOMEN PELVIS WO CONTRAST  Result Date: 09/07/2020 CLINICAL DATA:  History of Crohn's disease. Abdominal pain. Rule out small bowel obstruction. Progressive nausea and vomiting EXAM: CT ABDOMEN AND PELVIS WITHOUT CONTRAST TECHNIQUE: Multidetector CT imaging of the abdomen and pelvis was performed following the standard protocol without IV contrast. COMPARISON:  08/31/2020 FINDINGS: Lower chest: No acute findings Hepatobiliary: Within the limitations of unenhanced technique no suspicious liver abnormality identified. Pneumobilia is identified compatible with biliary patency. Previous cholecystectomy. Pancreas: Similar appearance of mild increase caliber of the main pancreatic duct. No signs of pancreatic inflammation or mass. Spleen: Normal in size without focal abnormality. Adrenals/Urinary Tract: Normal appearance of the adrenal glands. The kidneys are unremarkable. No mass or hydronephrosis identified. Moderate bladder distension. No focal bladder abnormality. Stomach/Bowel: The stomach appears unremarkable. There are signs of multiple bowel resections involving both large and small bowel loops with resultant complex anatomy. Evaluation of the bowel loops is further complicated by lack of IV and oral contrast material. Must of the small bowel loops are identified within the right hemiabdomen and measure up to 3.7 cm in maximum diameter. Diminished caliber small bowel loops are noted within the central abdomen. The patient appears to be status post right hemicolectomy. There is gaseous distension of the  distal transverse colon beyond the level of the enterocolonic anastomosis. A large stool burden identified within the distal transverse colon, splenic flexure and descending colon. There are several intervening segments of gas distended colon are noted between stool filled loops of distal colon. Vascular/Lymphatic: There is a right-sided central venous catheter with the tip in a  branch of the hepatic vein. Mild aortic atherosclerosis. No aneurysm. No abdominopelvic adenopathy. Reproductive: Status post hysterectomy. No adnexal masses. Other: No free fluid or fluid collections Musculoskeletal: No acute or significant osseous findings. IMPRESSION: 1. There are signs of multiple bowel resections involving both large and small bowel loops with resultant complex bowel anatomy. Evaluation of the bowel loops is further complicated by lack of IV and oral contrast material. Several dilated loops of small bowel are noted in the right abdomen with air-fluid levels. Cannot exclude partial small bowel obstruction. If there is a continued concern for bowel obstruction and location of possible transition point is of clinical concern then follow-up imaging after administration of oral and IV contrast material is recommended. 2. Large stool burden identified within the distal transverse colon, splenic flexure and descending colon. There are several intervening segments of gas distended colon between stool filled loops of distal colon. Findings may reflect constipation. 3. Pneumobilia compatible with biliary patency. 4. Right-sided central venous catheter with the tip in a branch of the hepatic vein. 5. Aortic atherosclerosis. Aortic Atherosclerosis (ICD10-I70.0). Electronically Signed   By: Kerby Moors M.D.   On: 09/07/2020 15:05    Anti-infectives: Anti-infectives (From admission, onward)   Start     Dose/Rate Route Frequency Ordered Stop   09/08/20 1400  levofloxacin (LEVAQUIN) IVPB 750 mg        750 mg 100 mL/hr over 90 Minutes Intravenous Every 48 hours 09/07/20 1550 09/16/20 1359   09/06/20 1400  levofloxacin (LEVAQUIN) tablet 750 mg  Status:  Discontinued        750 mg Oral Every 48 hours 09/06/20 0912 09/07/20 1550   09/04/20 1400  sulfamethoxazole-trimethoprim (BACTRIM DS) 800-160 MG per tablet 2 tablet  Status:  Discontinued        2 tablet Oral Every 8 hours 09/04/20 1006 09/06/20  0734   09/04/20 1000  sulfamethoxazole-trimethoprim (BACTRIM DS) 800-160 MG per tablet 1 tablet  Status:  Discontinued        1 tablet Oral Every 12 hours 09/04/20 0945 09/04/20 1004   09/01/20 1600  sulfamethoxazole-trimethoprim (BACTRIM) 320 mg in dextrose 5 % 500 mL IVPB  Status:  Discontinued        320 mg 346.7 mL/hr over 90 Minutes Intravenous Every 8 hours 09/01/20 0819 09/04/20 0945   09/01/20 0000  sulfamethoxazole-trimethoprim (BACTRIM) 317.44 mg in dextrose 5 % 500 mL IVPB  Status:  Discontinued        15 mg/kg/day  63.5 kg 346.6 mL/hr over 90 Minutes Intravenous Every 8 hours 08/31/20 2356 09/01/20 0819     Assessment/Plan CKD3 Bacteremia - stenotrophomonas maltophilia 10/6 Crohn's 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 TPN dependent. Per patient she was not a candidate for bowel transplant while in [place] but I do think it may be worth while for her to get another opinion from a tertiary/academic center with small bowel transplant service in the future, given she has more family support here. At this time she is not open to abdominal surgery  so we would need to discuss this with the patient in more detail.  Possible pSBO   - afebrile, VSS - continue symptomatic management of nausea and acute on chronic abdominal pain, place NG tube if develops worsening nausea/vomiting today. 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 If she is having worsening nausea or emesis she will stop eating/drinking  FEN: TNP, CLD, place NG for worsening emesis ID: [email or web address], VTE: SCD's, ok for  chemical VTE from surgical perspective Foley: none   LOS: 7 days    Obie Dredge, [facility] Please see Amion for pager number during day hours 7:00am-4:30pm

## 2020-09-09 ENCOUNTER — Inpatient Hospital Stay (HOSPITAL_COMMUNITY): Payer: Medicare Other

## 2020-09-09 DIAGNOSIS — K50819 Crohn's disease of both small and large intestine with unspecified complications: Secondary | ICD-10-CM | POA: Diagnosis not present

## 2020-09-09 DIAGNOSIS — T827XXD Infection and inflammatory reaction due to other cardiac and vascular devices, implants and grafts, subsequent encounter: Secondary | ICD-10-CM | POA: Diagnosis not present

## 2020-09-09 DIAGNOSIS — R109 Unspecified abdominal pain: Secondary | ICD-10-CM | POA: Diagnosis not present

## 2020-09-09 DIAGNOSIS — A498 Other bacterial infections of unspecified site: Secondary | ICD-10-CM

## 2020-09-09 DIAGNOSIS — R7881 Bacteremia: Secondary | ICD-10-CM | POA: Diagnosis not present

## 2020-09-09 LAB — MAGNESIUM: Magnesium: 2.1 mg/dL (ref 1.7–2.4)

## 2020-09-09 LAB — GLUCOSE, CAPILLARY
Glucose-Capillary: 117 mg/dL — ABNORMAL HIGH (ref 70–99)
Glucose-Capillary: 122 mg/dL — ABNORMAL HIGH (ref 70–99)
Glucose-Capillary: 135 mg/dL — ABNORMAL HIGH (ref 70–99)
Glucose-Capillary: 145 mg/dL — ABNORMAL HIGH (ref 70–99)
Glucose-Capillary: 90 mg/dL (ref 70–99)

## 2020-09-09 LAB — CULTURE, BLOOD (ROUTINE X 2): Special Requests: ADEQUATE

## 2020-09-09 LAB — BASIC METABOLIC PANEL
Anion gap: 8 (ref 5–15)
BUN: 49 mg/dL — ABNORMAL HIGH (ref 6–20)
CO2: 21 mmol/L — ABNORMAL LOW (ref 22–32)
Calcium: 10 mg/dL (ref 8.9–10.3)
Chloride: 104 mmol/L (ref 98–111)
Creatinine, Ser: 1.53 mg/dL — ABNORMAL HIGH (ref 0.44–1.00)
GFR, Estimated: 37 mL/min — ABNORMAL LOW (ref >60)
Glucose, Bld: 137 mg/dL — ABNORMAL HIGH (ref 70–99)
Potassium: 4 mmol/L (ref 3.5–5.1)
Sodium: 133 mmol/L — ABNORMAL LOW (ref 135–145)

## 2020-09-09 LAB — PHOSPHORUS: Phosphorus: 2.1 mg/dL — ABNORMAL LOW (ref 2.5–4.6)

## 2020-09-09 MED ORDER — SODIUM PHOSPHATES 45 MMOLE/15ML IV SOLN
30.0000 mmol | Freq: Once | INTRAVENOUS | Status: DC
  Filled 2020-09-09: qty 10

## 2020-09-09 MED ORDER — PANTOPRAZOLE SODIUM 40 MG IV SOLR
40.0000 mg | Freq: Every day | INTRAVENOUS | Status: DC
  Administered 2020-09-09 – 2020-09-12 (×4): 40 mg via INTRAVENOUS
  Filled 2020-09-09 (×4): qty 40

## 2020-09-09 MED ORDER — DIATRIZOATE MEGLUMINE & SODIUM 66-10 % PO SOLN
90.0000 mL | Freq: Once | ORAL | Status: AC
  Administered 2020-09-09: 90 mL via NASOGASTRIC
  Filled 2020-09-09: qty 90

## 2020-09-09 MED ORDER — TRAVASOL 10 % IV SOLN
INTRAVENOUS | Status: AC
  Filled 2020-09-09: qty 756

## 2020-09-09 NOTE — Progress Notes (Signed)
PHARMACY - TOTAL PARENTERAL NUTRITION CONSULT NOTE   Indication: on TPN prior to admission with hx Crohn's disease with short gut syndrome  Patient Measurements: Height: 5' 8"  (172.7 cm) Weight: 63.5 kg (140 lb) IBW/kg (Calculated) : 63.9 TPN AdjBW (KG): 63.5 Body mass index is 21.29 kg/m.  Assessment: TPN was originally on hold due to possibility of PICC removal but ID has decided that will try and keep the line for now and treat bacteremia. Per Dr. Linus Salmons on 10/10, ok to resume TPN through current PICC line.  Glucose / Insulin:  SSI discontinued on 10/11 given stable CBGs. CBG from previous 24 hrs are WNL (goal 100-150) including a BG check while TPN off.  Electrolytes: Na (133) & Phos (2.1) are low. CorrCa (10.6) is slightly elevated. Other lytes WNL -Bicarb remains slightly low, but increased Renal: SCr 1.53, BUN 49. Both trending down. Good UOP recorded. LFTs / TGs: AST WNL, ALT trending down; TG WNL. Lipids added back to TPN on 10/11 Prealbumin / albumin: 30.5/3.2 Intake / Output; MIVF: LR @ 100 mL/hr while TPN is off per MD -Unmeasured UOP x4 GI Imaging:  Surgeries / Procedures: n/a  Central access: PICC line TPN start date: on PTA  Nutritional Goals (per RD recommendation on 10/14): kCal: 1905-2100, Protein: 95-110, Fluid: >= 2L/day  Per home TPN formula, 1800 mL over 14 hours to provide 75g protein and 1081 kcal with no lipid and when lipid added to TPN, provides 75g protein and 1481 kcal  Current Nutrition:  CLD for comfort + cyclic TPN. NGT placed 10/15 -Prosource 30 mL BID, [employer] 1.4 qday per RD  Plan:  Now:  Sodium phosphate 30 mmol IV once ordered per MD  At 1800:  1800 mL of TPN over 14 hours to provide 75g protein and 1458 Kcal   Electrolytes in TPN: Add K; Remove Ca; Increase Na  168mq/L of Na, 244m/L of K, 32m33mL of Ca, 5mE7m of Mg, and 0 mmol/L of Phos.   Cl:Ac ratio 1:2  MVI liquid being given PO; trace elements added to TPN  LR @ 100  mL/hr when TPN off per MD  Monitor TPN labs on Mon/Thurs; recheck electrolytes with AM labs tomorrow  MaryLenis NoonarmD 09/09/20 10:46 AM

## 2020-09-09 NOTE — Progress Notes (Signed)
[facility] Progress Note     Subjective: Patient requested NGT overnight secondary to increased nausea. No flatus or stool yet today. Last BM yesterday. Patient still very insistent that she does not want surgery unless there is no other option. Discussed importance of mobilization today.   Objective: Vital signs in last 24 hours: Temp:  [98.5 F (36.9 C)-98.8 F (37.1 C)] 98.7 F (37.1 C) (10/15 0615) Pulse Rate:  [74-110] 110 (10/15 0615) Resp:  [15-18] 15 (10/15 0615) BP: (115-161)/(85-87) 151/87 (10/15 0615) SpO2:  [97 %-99 %] 99 % (10/15 0615) Last BM Date: 09/08/20  Intake/Output from previous day: 10/14 0701 - 10/15 0700 In: 2382.4 [I.V.:2232.4; IV Piggyback:150] Out: 3149 [Urine:1550] Intake/Output this shift: No intake/output data recorded.  PE: General: pleasant, WD, thin female who is laying in bed in NAD Heart: regular, rate, and rhythm. Palpable radial and pedal pulses bilaterally Lungs: CTAB, no wheezes, rhonchi, or rales noted.  Respiratory effort nonlabored Abd: soft, ttp along midline scar, ND, +BS, NGT with minimal bilious drainage  Lab Results:  Recent Labs    09/08/20 0448  WBC 4.9  HGB 9.4*  HCT 28.3*  PLT 206   BMET Recent Labs    09/08/20 0448 09/09/20 0400  NA 132* 133*  K 4.6 4.0  CL 105 104  CO2 19* 21*  GLUCOSE 126* 137*  BUN 53* 49*  CREATININE 2.02* 1.53*  CALCIUM 9.4 10.0   PT/INR No results for input(s): LABPROT, INR in the last 72 hours. CMP     Component Value Date/Time   NA 133 (L) 09/09/2020 0400   K 4.0 09/09/2020 0400   CL 104 09/09/2020 0400   CO2 21 (L) 09/09/2020 0400   GLUCOSE 137 (H) 09/09/2020 0400   BUN 49 (H) 09/09/2020 0400   CREATININE 1.53 (H) 09/09/2020 0400   CALCIUM 10.0 09/09/2020 0400   PROT 7.1 09/08/2020 0448   ALBUMIN 3.2 (L) 09/08/2020 0448   AST 31 09/08/2020 0448   ALT 89 (H) 09/08/2020 0448   ALKPHOS 206 (H) 09/08/2020 0448   BILITOT 0.5 09/08/2020 0448   GFRNONAA 37 (L)  09/09/2020 0400   GFRAA 55 (L) 08/12/2020 0513   Lipase     Component Value Date/Time   LIPASE 23 09/07/2020 1136       Studies/Results: CT ABDOMEN PELVIS WO CONTRAST  Result Date: 09/07/2020 CLINICAL DATA:  History of Crohn's disease. Abdominal pain. Rule out small bowel obstruction. Progressive nausea and vomiting EXAM: CT ABDOMEN AND PELVIS WITHOUT CONTRAST TECHNIQUE: Multidetector CT imaging of the abdomen and pelvis was performed following the standard protocol without IV contrast. COMPARISON:  08/31/2020 FINDINGS: Lower chest: No acute findings Hepatobiliary: Within the limitations of unenhanced technique no suspicious liver abnormality identified. Pneumobilia is identified compatible with biliary patency. Previous cholecystectomy. Pancreas: Similar appearance of mild increase caliber of the main pancreatic duct. No signs of pancreatic inflammation or mass. Spleen: Normal in size without focal abnormality. Adrenals/Urinary Tract: Normal appearance of the adrenal glands. The kidneys are unremarkable. No mass or hydronephrosis identified. Moderate bladder distension. No focal bladder abnormality. Stomach/Bowel: The stomach appears unremarkable. There are signs of multiple bowel resections involving both large and small bowel loops with resultant complex anatomy. Evaluation of the bowel loops is further complicated by lack of IV and oral contrast material. Must of the small bowel loops are identified within the right hemiabdomen and measure up to 3.7 cm in maximum diameter. Diminished caliber small bowel loops are noted within the central abdomen.  The patient appears to be status post right hemicolectomy. There is gaseous distension of the distal transverse colon beyond the level of the enterocolonic anastomosis. A large stool burden identified within the distal transverse colon, splenic flexure and descending colon. There are several intervening segments of gas distended colon are noted between  stool filled loops of distal colon. Vascular/Lymphatic: There is a right-sided central venous catheter with the tip in a branch of the hepatic vein. Mild aortic atherosclerosis. No aneurysm. No abdominopelvic adenopathy. Reproductive: Status post hysterectomy. No adnexal masses. Other: No free fluid or fluid collections Musculoskeletal: No acute or significant osseous findings. IMPRESSION: 1. There are signs of multiple bowel resections involving both large and small bowel loops with resultant complex bowel anatomy. Evaluation of the bowel loops is further complicated by lack of IV and oral contrast material. Several dilated loops of small bowel are noted in the right abdomen with air-fluid levels. Cannot exclude partial small bowel obstruction. If there is a continued concern for bowel obstruction and location of possible transition point is of clinical concern then follow-up imaging after administration of oral and IV contrast material is recommended. 2. Large stool burden identified within the distal transverse colon, splenic flexure and descending colon. There are several intervening segments of gas distended colon between stool filled loops of distal colon. Findings may reflect constipation. 3. Pneumobilia compatible with biliary patency. 4. Right-sided central venous catheter with the tip in a branch of the hepatic vein. 5. Aortic atherosclerosis. Aortic Atherosclerosis (ICD10-I70.0). Electronically Signed   By: Kerby Moors M.D.   On: 09/07/2020 15:05   DG Abd Portable 1V  Result Date: 09/09/2020 CLINICAL DATA:  NG placement EXAM: PORTABLE ABDOMEN - 1 VIEW COMPARISON:  08/11/2020. FINDINGS: NG tube in the stomach. Tip is in the lateral proximal body of the stomach. Side hole near the GE junction. Moderate gas in the colon suggestive of ileus. This is unchanged. Right femoral venous catheter tip in the hepatic IVC. IMPRESSION: NG tip in the lateral stomach with the side hole near the GE junction.  Recommend advancing NG. Electronically Signed   By: Franchot Gallo M.D.   On: 09/09/2020 07:58    Anti-infectives: Anti-infectives (From admission, onward)   Start     Dose/Rate Route Frequency Ordered Stop   09/08/20 1400  levofloxacin (LEVAQUIN) IVPB 750 mg        750 mg 100 mL/hr over 90 Minutes Intravenous Every 48 hours 09/07/20 1550 09/16/20 1359   09/06/20 1400  levofloxacin (LEVAQUIN) tablet 750 mg  Status:  Discontinued        750 mg Oral Every 48 hours 09/06/20 0912 09/07/20 1550   09/04/20 1400  sulfamethoxazole-trimethoprim (BACTRIM DS) 800-160 MG per tablet 2 tablet  Status:  Discontinued        2 tablet Oral Every 8 hours 09/04/20 1006 09/06/20 0734   09/04/20 1000  sulfamethoxazole-trimethoprim (BACTRIM DS) 800-160 MG per tablet 1 tablet  Status:  Discontinued        1 tablet Oral Every 12 hours 09/04/20 0945 09/04/20 1004   09/01/20 1600  sulfamethoxazole-trimethoprim (BACTRIM) 320 mg in dextrose 5 % 500 mL IVPB  Status:  Discontinued        320 mg 346.7 mL/hr over 90 Minutes Intravenous Every 8 hours 09/01/20 0819 09/04/20 0945   09/01/20 0000  sulfamethoxazole-trimethoprim (BACTRIM) 317.44 mg in dextrose 5 % 500 mL IVPB  Status:  Discontinued        15 mg/kg/day  63.5 kg 346.6 mL/hr  over 90 Minutes Intravenous Every 8 hours 08/31/20 2356 09/01/20 0819       Assessment/Plan CKD stage III Bacteremia - stenotrophomonas maltophilia 10/6 Crohn's Disease- diagnosed 1980's 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 TPN dependent. Per patient she was not a candidate for bowel transplant while in [place] but I do think it may be worth while for her to get another opinion from a tertiary/academic center with small bowel transplant service in the future, given she has more family support here. At this time she is not open to abdominal surgery so we would need  to discuss this with the patient in more detail.  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

## 2020-09-09 NOTE — Evaluation (Signed)
Physical Therapy Re-Evaluation Patient Details Name: Caitlin Peters MRN: [id number] DOB: [birth year] Today's Date: 09/09/2020   History of Present Illness  59yo F with crohn's with hx of colectomy now TPN dependent with limited venous acces, admitted 10/06 for sepsis found to have central line related stenotrophomonas bacteremia  Clinical Impression  Patient has NG suction, reports not ambulating now, just gets to  [facility]. Patient will benefit from frequent ambulation. Encouraged patient to ask staff. Pt admitted with above diagnosis.  Pt currently with functional limitations due to the deficits listed below (see PT Problem List). Pt will benefit from skilled PT to increase their independence and safety with mobility to allow discharge to the venue listed below.       Follow Up Recommendations No PT follow up    Equipment Recommendations  None recommended by PT    Recommendations for Other Services       Precautions / Restrictions Precautions Precautions: Fall Precaution Comments: pt reports ~5 falls in past 1 year, they occur when her Crohn's flares, no falls recently, Naso suction      Mobility  Bed Mobility Overal bed mobility: Independent                Transfers Overall transfer level: Independent                  Ambulation/Gait Ambulation/Gait assistance: Supervision Gait Distance (Feet): 400 Feet Assistive device: IV Pole Gait Pattern/deviations: Step-through pattern        Stairs            Wheelchair Mobility    Modified Rankin (Stroke Patients Only)       Balance                                             Pertinent Vitals/Pain Pain Location: abd Pain Descriptors / Indicators: Discomfort Pain Intervention(s): Monitored during session    Home Living Family/patient expects to be discharged to:: Private residence Living Arrangements: Children Available Help at Discharge: Family;Available 24 hours/day Type of  Home: Apartment Home Access: Stairs to enter Entrance Stairs-Rails: Right Entrance Stairs-Number of Steps: flight Home Layout: One level Home Equipment: Walker - 2 wheels;Walker - 4 wheels      Prior Function     Gait / Transfers Assistance Needed: Uses walker on a "bad day" - but predominantly does not need. Uses rollator when going out.  ADL's / Homemaking Assistance Needed: Reports family watches her in the shower and assists her off the commode or out of the bed if needed "on a bad day."        Hand Dominance        Extremity/Trunk Assessment   Upper Extremity Assessment Upper Extremity Assessment: Generalized weakness    Lower Extremity Assessment Lower Extremity Assessment: Generalized weakness    Cervical / Trunk Assessment Cervical / Trunk Assessment: Normal  Communication      Cognition Arousal/Alertness: Awake/alert Behavior During Therapy: WFL for tasks assessed/performed Overall Cognitive Status: Within Functional Limits for tasks assessed                                        General Comments      Exercises     Assessment/Plan    PT Assessment Patient needs continued  PT services  PT Problem List Decreased activity tolerance;Decreased mobility       PT Treatment Interventions Gait training;Therapeutic activities;Functional mobility training;Patient/family education    PT Goals (Current goals can be found in the Care Plan section)  Acute Rehab PT Goals Patient Stated Goal: HOME PT Goal Formulation: With patient Time For Goal Achievement: 09/16/20 Potential to Achieve Goals: Good    Frequency Min 2X/week   Barriers to discharge        Co-evaluation               AM-PAC PT "6 Clicks" Mobility  Outcome Measure Help needed turning from your back to your side while in a flat bed without using bedrails?: None Help needed moving from lying on your back to sitting on the side of a flat bed without using bedrails?:  None Help needed moving to and from a bed to a chair (including a wheelchair)?: None Help needed standing up from a chair using your arms (e.g., wheelchair or bedside chair)?: None Help needed to walk in hospital room?: A Little Help needed climbing 3-5 steps with a railing? : A Little 6 Click Score: 22    End of Session   Activity Tolerance: Patient tolerated treatment well Patient left: in bed;with call bell/phone within reach Nurse Communication: Mobility status PT Visit Diagnosis: Difficulty in walking, not elsewhere classified (R26.2)    Time: 1388-7195 PT Time Calculation (min) (ACUTE ONLY): 16 min   Charges:   PT Evaluation $PT Re-evaluation: 1 Re-eval          [place] Pager [phone] Office [phone]   Claretha Cooper 09/09/2020, 4:57 PM

## 2020-09-09 NOTE — TOC Initial Note (Signed)
Transition of Care [facility]) - Initial/Assessment Note    Patient Details  Name: Caitlin Peters MRN: [id number] Date of Birth: 1960/12/01  [facility] CM/SW Contact:    Lia Hopping, [place] Phone Number: 09/09/2020, 3:01 PM  Clinical Narrative:                 Patient lives in the home with her daughter and 2 grandsons. Patient reports she is independent with getting dress but needs assistance with getting in the shower. Patient is ambulaorty wihtout assistance however she has rolling walker when needed.Patient is currently active with [facility] for a nurse and [facility] for TPN services.  [facility] and [facility] is following the patient for discharge needs.   Expected Discharge Plan: [place] Barriers to Discharge: Continued Medical Work up   Patient Goals and CMS Choice        Expected Discharge Plan and Services Expected Discharge Plan: [place]   Discharge Planning Services: CM Consult   Living arrangements for the past 2 months: Single Family Home                   DME Agency:  ([facility])       HH Arranged: TPN HH Agency: [facility], [place] Date [facility] Agency Contacted: 09/09/20 Time HH Agency Contacted: 76 Representative spoke with at [place]: [place]  Prior Living Arrangements/Services Living arrangements for the past 2 months: [place] with:: Adult Children, Relatives Patient language and need for interpreter reviewed:: No Do you feel safe going back to the place where you live?: Yes      Need for Family Participation in Patient Care: Yes (Comment) Care giver support system in place?: Yes (comment) Current home services: Home RN Criminal Activity/Legal Involvement Pertinent to Current Situation/Hospitalization: No - Comment as needed  Activities of Daily Living Home Assistive Devices/Equipment:  Gilford Rile (specify type) ADL Screening (condition at time of admission) Patient's cognitive ability adequate to safely complete daily activities?: Yes Is the patient deaf or have difficulty hearing?: No Does the patient have difficulty seeing, even when wearing glasses/contacts?: No Does the patient have difficulty concentrating, remembering, or making decisions?: No Patient able to express need for assistance with ADLs?: Yes Does the patient have difficulty dressing or bathing?: No Independently performs ADLs?: Yes (appropriate for developmental age) Does the patient have difficulty walking or climbing stairs?: Yes Weakness of Legs: None Weakness of Arms/Hands: None  Permission Sought/Granted Permission sought to share information with : Other (comment)       Permission granted to share info w AGENCY: [place]        Emotional Assessment Appearance:: Appears stated age Attitude/Demeanor/Rapport: Engaged Affect (typically observed): Pleasant Orientation: : Oriented to Self, Oriented to Place, Oriented to  Time, Oriented to Situation Alcohol / Substance Use: Not Applicable Psych Involvement: No (comment)  Admission diagnosis:  Bacteremia [R78.81] Sepsis ([place]) [A41.9] Infection due to Stenotrophomonas maltophilia [A49.8] Patient Active Problem List   Diagnosis Date Noted  . Abdominal pain 09/07/2020  . Acute metabolic encephalopathy   . Hypomagnesemia   . Partial small bowel obstruction ([place])   . Abnormal CT of the abdomen   . Bacteremia associated with intravascular line ([place]) 08/04/2020  . Enterobacter sepsis ([place]) 07/22/2020  . SIRS (systemic inflammatory response syndrome) ([place]) 07/19/2020  . Anxiety 06/03/2020  . Acute pancreatitis 04/13/2020  .  Infection due to Stenotrophomonas maltophilia 03/10/2020  . Abnormal LFTs 03/05/2020  . Pancytopenia ([place]) 03/05/2020  . Central line infection   . On total parenteral nutrition (TPN) 01/02/2020  . Elevated liver  enzymes 01/02/2020  . Cholangitis   . Anasarca 10/28/2019  . Acute kidney injury superimposed on chronic kidney disease ([place]) 10/28/2019  . Falls 10/28/2019  . Crohn disease ([place])   . Malnutrition ([place])   . Short gut syndrome   . HTN (hypertension)   . Vitamin B12 deficiency   . Osteoporosis   . Depression   . GERD (gastroesophageal reflux disease)   . Chronic pain syndrome   . Hypokalemia due to excessive gastrointestinal loss of potassium 10/13/2019  . Diarrhea   . Fever   . Hypokalemia 10/12/2019  . Chronic diarrhea 10/12/2019  . Dysuria 10/12/2019  . Bilateral lower extremity edema 10/12/2019  . Crohn's colitis ([place]) 10/12/2019  . Anemia 10/12/2019  . Hypertension 10/12/2019  . AKI (acute kidney injury) ([place]) 10/12/2019   PCP:  [name], [name], MD Pharmacy:   [facility] - [place], [place] - [address] [place] [address] Phone: [phone] Fax: [phone]  [place], [place] [place] [address] Phone: [phone] Fax: [phone]     Social Determinants of Health (SDOH) Interventions    Readmission Risk Interventions Readmission Risk Prevention Plan 09/09/2020 08/05/2020 06/29/2020  Transportation Screening Complete Complete Complete  PCP or Specialist Appt within 3-5 Days - - -  Not Complete comments - - -  HRI or [place] for [place] consult not completed comments - - -  Palliative Care Screening - - -  Medication Review Press photographer) Complete Complete Complete  PCP or Specialist appointment within 3-5 days of discharge Complete Complete Complete  HRI or Home Care Consult Complete Complete Complete  SW Recovery Care/Counseling Consult Complete Complete Complete  Palliative Care Screening Not Applicable Not Applicable Not [place] Not Applicable Not Applicable Not Applicable

## 2020-09-09 NOTE — Progress Notes (Signed)
PROGRESS NOTE    Caitlin Peters   ZYS:[id number]  DOB: 01-Nov-1961  DOA: 08/31/2020     8  PCP: Caitlin Peters, Caitlin Halsted, MD  CC: fever, chills   Hospital Course: 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 She also has had multiple PICC line infections and has had to have several removed from throughout her body over the years and has multiple scars to show where previous lines have been.  She had her most recent PICC placed in the right femoral vein on 08/05/20. She is admitted to the hospital on 09/01/20 with another bacteremia (Stenotrophomonas) and was started on Bactrim, but developed AKI and was then changed to Levaquin.   Furthermore, on 09/07/20 she developed acute abdominal pain on top of her chronic pain she lives with. She was unable to tolerate pills nor oral contrast and due to resolving AKI did not receive IV contrast either; but a CT abd/pelvis was obtained which revealed concern for underlying SBO. Surgery was consulted for further evaluation and assistance given her complex underlying history.  After evaluation, the patient decided she did not want any further surgeries especially if not emergently needed.  It was decided to manage her supportively and if she developed any further nausea and/or vomiting, then to place NG tube. On the night of 09/08/2020, she became more nauseous and had severe vomiting.  NG tube was then placed.  She noticed improvement in her symptoms after this.   Interval History:  More comfortable this am after NGT placed overnight. She told me she got very nauseous and threw up hard. She is also agreeable and wanting to work with PT to start getting out of bed again.  Pain at rest does seem better today but still very TTP on exam.   Old records reviewed in assessment of this patient  ROS: Constitutional: positive  for anorexia, fatigue, malaise and weight loss, Respiratory: negative for cough, Cardiovascular: negative for chest pain and Gastrointestinal: positive for abdominal pain and nausea  Assessment & Plan: * Bacteremia associated with intravascular line ([place]) - right femoral PICC placed 08/05/20; now with Stenotrophomonas bacteremia; ID following - did not tolerate Bactrim due to AKI, now on Levaquin - patient has had recurrent infections/bacteremias; we are attempting to treat with PICC in place; clinically she's at least tolerating and responding fairly well (e.g. no fevers, she's chronically neutropenic so difficult to even know if sign of sepsis for her, but her differential is relatively reassuring) - tentative plan is to leave PICC in place and continue Levaquin until 10/21 for 14 day course - blood culture from 10/7 is negative  - if fever repeat blood cultures (PICC and PIV) - outpatient ID f/u on 10/25  Abdominal pain - CT abdomen/pelvis performed on 09/07/2020 reveals possible obstruction.  Her underlying anatomy complicates the picture with interpretation.  Greatly appreciate surgery assistance in the management of her care.  Supportive and symptomatic management is ultimate goal.  Patient declining surgery unless absolutely necessary -NG tube required evening of 09/08/2020 -SBO protocol ordered per surgery -Follow-up Gastrografin study -PT ordered again today to help with mobilizing/ambulating again  Crohn disease ([place]) - long standing history; multiple abdominal surgeries; now has short gut syndrome and she is not thriving well recently; also dependent on TPN since 2003 she states; has not been a candidate for biologics due to  recurrent infections - careeverywhere reviewed; last clinical summary from 2015 "Multiple SBR for Crohn's; 120 cm remaining small bowel; jejunal to transverse colon anastomosis". Patient relocated from [place] to [place] to be closer to family - for now, continue TPN for  short gut syndrome - long term prognosis is poor (multiple hospitalizations, infections, weight loss/malnutrition, chronic pain, poor quality of life)  On total parenteral nutrition (TPN) - R femoral PICC in place  - continue TPN  Acute renal failure superimposed on stage 3b chronic kidney disease ([name])-resolved as of 09/09/2020 - baseline probably close to 1.5 creatinine; mild increase from Bactrim which was changed to Levaquin for above infection - creatinine appears to have peaked at 2.02, now has trended back to baseline - continue IVF when TPN is off and trend BMP  Abnormal LFTs - possibly related to TPN as well - intermittent CMP  Short gut syndrome -See Crohn's disease  Sepsis ([name])-resolved as of 09/07/2020 - see bacteremia   Antimicrobials: Bactrim 09/01/2020>> 09/06/2020 Levaquin 09/06/2020>> present  DVT prophylaxis: SCD Code Status: Full Family Communication: none present Disposition Plan: Status is: Inpatient  Remains inpatient appropriate because:Ongoing active pain requiring inpatient pain management, Unsafe d/c plan, IV treatments appropriate due to intensity of illness or inability to take PO and Inpatient level of care appropriate due to severity of illness   Dispo: The patient is from: Home              Anticipated d/c is to: Home              Anticipated d/c date is: > 3 days              Patient currently is not medically stable to d/c.   Objective: Blood pressure (!) 151/87, pulse (!) 110, temperature 98.7 F (37.1 C), temperature source Oral, resp. rate 15, height 5' 8"  (1.727 m), weight 63.5 kg, SpO2 99 %.  Examination: General appearance: more comfortable appearing woman laying in bed in no distress but still in pain Head: Normocephalic, without obvious abnormality, atraumatic Eyes: EOMI Lungs: clear to auscultation bilaterally Heart: regular rate and rhythm and S1, S2 normal Abdomen: Large midline abdominal scar noted from multiple prior  surgeries.  BS are improved some but still  hypoactive in lower quadrants. She is still immediatley TTP with just auscultation and grimces to it with guarding noted Extremities: Thin, no edema Skin: dry, scaly Neurologic: Grossly normal  Consultants:   ID  Surgery  Procedures:   n/a  Data Reviewed: I have personally reviewed following labs and imaging studies Results for orders placed or performed during the hospital encounter of 08/31/20 (from the past 24 hour(s))  Glucose, capillary     Status: Abnormal   Collection Time: 09/08/20  3:11 PM  Result Value Ref Range   Glucose-Capillary 115 (H) 70 - 99 mg/dL  Glucose, capillary     Status: Abnormal   Collection Time: 09/09/20 12:09 AM  Result Value Ref Range   Glucose-Capillary 135 (H) 70 - 99 mg/dL  Basic metabolic panel     Status: Abnormal   Collection Time: 09/09/20  4:00 AM  Result Value Ref Range   Sodium 133 (L) 135 - 145 mmol/L   Potassium 4.0 3.5 - 5.1 mmol/L   Chloride 104 98 - 111 mmol/L   CO2 21 (L) 22 - 32 mmol/L   Glucose, Bld 137 (H) 70 - 99 mg/dL   BUN 49 (H) 6 - 20 mg/dL   Creatinine, Ser 1.53 (H) 0.44 -  1.00 mg/dL   Calcium 10.0 8.9 - 10.3 mg/dL   GFR, Estimated 37 (L) >60 mL/min   Anion gap 8 5 - 15  Magnesium     Status: None   Collection Time: 09/09/20  4:00 AM  Result Value Ref Range   Magnesium 2.1 1.7 - 2.4 mg/dL  Phosphorus     Status: Abnormal   Collection Time: 09/09/20  4:00 AM  Result Value Ref Range   Phosphorus 2.1 (L) 2.5 - 4.6 mg/dL  Glucose, capillary     Status: Abnormal   Collection Time: 09/09/20  6:19 AM  Result Value Ref Range   Glucose-Capillary 145 (H) 70 - 99 mg/dL  Glucose, capillary     Status: Abnormal   Collection Time: 09/09/20 11:36 AM  Result Value Ref Range   Glucose-Capillary 117 (H) 70 - 99 mg/dL    Recent Results (from the past 240 hour(s))  Blood culture (routine x 2)     Status: Abnormal   Collection Time: 08/30/20 10:50 PM   Specimen: BLOOD  Result Value  Ref Range Status   Specimen Description   Final    BLOOD CENTRAL LINE Performed at [facility], [place] [address] [place], [place] [address]    Special Requests   Final    BOTTLES DRAWN AEROBIC AND ANAEROBIC Blood Culture adequate volume Performed at [place] [address] [place], [place] [address]    Culture  Setup Time   Final    GRAM NEGATIVE RODS AEROBIC BOTTLE ONLY CRITICAL RESULT CALLED TO, READ BACK BY AND VERIFIED WITH: L ADKINS RN 08/31/20 2114 JDW Performed at [name], [place] [address] [place], [place] [address]    Culture STENOTROPHOMONAS MALTOPHILIA (A)  Final   Report Status 09/02/2020 FINAL  Final   Organism ID, Bacteria STENOTROPHOMONAS MALTOPHILIA  Final      Susceptibility   Stenotrophomonas maltophilia - MIC*    LEVOFLOXACIN 0.5 SENSITIVE Sensitive     TRIMETH/SULFA <=20 SENSITIVE Sensitive     * STENOTROPHOMONAS MALTOPHILIA  Blood Culture ID Panel (Reflexed)     Status: Abnormal   Collection Time: 08/30/20 10:50 PM  Result Value Ref Range Status   Enterococcus faecalis NOT DETECTED NOT DETECTED Final   Enterococcus Faecium NOT DETECTED NOT DETECTED Final   Listeria monocytogenes NOT DETECTED NOT DETECTED Final   Staphylococcus species NOT DETECTED NOT DETECTED Final   Staphylococcus aureus (BCID) NOT DETECTED NOT DETECTED Final   Staphylococcus epidermidis NOT DETECTED NOT DETECTED Final   Staphylococcus lugdunensis NOT DETECTED NOT DETECTED Final   Streptococcus species NOT DETECTED NOT DETECTED Final   Streptococcus agalactiae NOT DETECTED NOT DETECTED Final   Streptococcus pneumoniae NOT DETECTED NOT DETECTED Final   Streptococcus pyogenes NOT DETECTED NOT DETECTED Final   A.calcoaceticus-baumannii NOT DETECTED NOT DETECTED Final   Bacteroides fragilis NOT DETECTED NOT DETECTED Final   Enterobacterales NOT DETECTED NOT DETECTED Final   Enterobacter cloacae complex NOT DETECTED NOT DETECTED Final    Escherichia coli NOT DETECTED NOT DETECTED Final   Klebsiella aerogenes NOT DETECTED NOT DETECTED Final   Klebsiella oxytoca NOT DETECTED NOT DETECTED Final   Klebsiella pneumoniae NOT DETECTED NOT DETECTED Final   Proteus species NOT DETECTED NOT DETECTED Final   Salmonella species NOT DETECTED NOT DETECTED Final   Serratia marcescens NOT DETECTED NOT DETECTED Final   Haemophilus influenzae NOT DETECTED NOT DETECTED Final   Neisseria meningitidis NOT DETECTED NOT DETECTED Final   Pseudomonas aeruginosa NOT  DETECTED NOT DETECTED Final   Stenotrophomonas maltophilia DETECTED (A) NOT DETECTED Final    Comment: CRITICAL RESULT CALLED TO, READ BACK BY AND VERIFIED WITH: L ADKINS RN 08/31/20 2114 JDW    Candida albicans NOT DETECTED NOT DETECTED Final   Candida auris NOT DETECTED NOT DETECTED Final   Candida glabrata NOT DETECTED NOT DETECTED Final   Candida krusei NOT DETECTED NOT DETECTED Final   Candida parapsilosis NOT DETECTED NOT DETECTED Final   Candida tropicalis NOT DETECTED NOT DETECTED Final   Cryptococcus neoformans/gattii NOT DETECTED NOT DETECTED Final    Comment: Performed at [facility], [place] [address] [place], [place] [address]  Respiratory Panel by RT PCR (Flu A&B, Covid) - Nasopharyngeal Swab     Status: None   Collection Time: 08/31/20 12:03 AM   Specimen: Nasopharyngeal Swab  Result Value Ref Range Status   SARS Coronavirus 2 by RT PCR NEGATIVE NEGATIVE Final    Comment: (NOTE) SARS-CoV-2 target nucleic acids are NOT DETECTED.  The SARS-CoV-2 RNA is generally detectable in upper respiratoy specimens during the acute phase of infection. The lowest concentration of SARS-CoV-2 viral copies this assay can detect is 131 copies/mL. A negative result does not preclude SARS-Cov-2 infection and should not be used as the sole basis for treatment or other patient management decisions. A negative result may occur with  improper specimen collection/handling, submission  of specimen other than nasopharyngeal swab, presence of viral mutation(s) within the areas targeted by this assay, and inadequate number of viral copies (<131 copies/mL). A negative result must be combined with clinical observations, patient history, and epidemiological information. The expected result is Negative.  Fact Sheet for Patients:  PinkCheek.be  Fact Sheet for Healthcare Providers:  GravelBags.it  This test is no t yet approved or cleared by the [place] FDA and  has been authorized for detection and/or diagnosis of SARS-CoV-2 by FDA under an Emergency Use Authorization (EUA). This EUA will remain  in effect (meaning this test can be used) for the duration of the COVID-19 declaration under Section 564(b)(1) of the Act, 21 U.S.C. section 360bbb-3(b)(1), unless the authorization is terminated or revoked sooner.     Influenza A by PCR NEGATIVE NEGATIVE Final   Influenza B by PCR NEGATIVE NEGATIVE Final    Comment: (NOTE) The Xpert Xpress SARS-CoV-2/FLU/RSV assay is intended as an aid in  the diagnosis of influenza from Nasopharyngeal swab specimens and  should not be used as a sole basis for treatment. Nasal washings and  aspirates are unacceptable for Xpert Xpress SARS-CoV-2/FLU/RSV  testing.  Fact Sheet for Patients: PinkCheek.be  Fact Sheet for Healthcare Providers: GravelBags.it  This test is not yet approved or cleared by the [place] FDA and  has been authorized for detection and/or diagnosis of SARS-CoV-2 by  FDA under an Emergency Use Authorization (EUA). This EUA will remain  in effect (meaning this test can be used) for the duration of the  Covid-19 declaration under Section 564(b)(1) of the Act, 21  U.S.C. section 360bbb-3(b)(1), unless the authorization is  terminated or revoked. Performed at [facility], [place]  [address] [place], [place] [address]   Urine culture     Status: Abnormal   Collection Time: 08/31/20  3:30 AM   Specimen: In/Out Cath Urine  Result Value Ref Range Status   Specimen Description   Final    IN/OUT CATH URINE Performed at [place] [address], [place], [place] [address]  Special Requests   Final    NONE Performed at [name], [place] [address] [place], [place] [address]    Culture (A)  Final    100 COLONIES/mL STAPHYLOCOCCUS EPIDERMIDIS 100 COLONIES/mL STAPHYLOCOCCUS WARNERI    Report Status 09/02/2020 FINAL  Final   Organism ID, Bacteria STAPHYLOCOCCUS EPIDERMIDIS (A)  Final   Organism ID, Bacteria STAPHYLOCOCCUS WARNERI (A)  Final      Susceptibility   Staphylococcus epidermidis - MIC*    CIPROFLOXACIN >=8 RESISTANT Resistant     GENTAMICIN 8 INTERMEDIATE Intermediate     NITROFURANTOIN <=16 SENSITIVE Sensitive     OXACILLIN >=4 RESISTANT Resistant     TETRACYCLINE <=1 SENSITIVE Sensitive     VANCOMYCIN 2 SENSITIVE Sensitive     TRIMETH/SULFA 80 RESISTANT Resistant     CLINDAMYCIN >=8 RESISTANT Resistant     RIFAMPIN <=0.5 SENSITIVE Sensitive     Inducible Clindamycin NEGATIVE Sensitive     * 100 COLONIES/mL STAPHYLOCOCCUS EPIDERMIDIS   Staphylococcus warneri - MIC*    CIPROFLOXACIN <=0.5 SENSITIVE Sensitive     GENTAMICIN <=0.5 SENSITIVE Sensitive     NITROFURANTOIN 32 SENSITIVE Sensitive     OXACILLIN <=0.25 SENSITIVE Sensitive     TETRACYCLINE <=1 SENSITIVE Sensitive     VANCOMYCIN 1 SENSITIVE Sensitive     TRIMETH/SULFA <=10 SENSITIVE Sensitive     CLINDAMYCIN >=8 RESISTANT Resistant     RIFAMPIN <=0.5 SENSITIVE Sensitive     Inducible Clindamycin NEGATIVE Sensitive     * 100 COLONIES/mL STAPHYLOCOCCUS WARNERI  Blood Culture (routine x 2)     Status: Abnormal (Preliminary result)   Collection Time: 08/31/20 11:35 PM   Specimen: BLOOD  Result Value Ref Range Status   Specimen Description   Final     BLOOD PICC LINE Performed at [facility], [place] [address] [place], [place] [address]    Special Requests   Final    BOTTLES DRAWN AEROBIC AND ANAEROBIC Blood Culture adequate volume Performed at [place] [address] [place], [place] [address]    Culture  Setup Time   Final    GRAM NEGATIVE RODS AEROBIC BOTTLE ONLY CRITICAL VALUE NOTED.  VALUE IS CONSISTENT WITH PREVIOUSLY REPORTED AND CALLED VALUE. GRAM POSITIVE RODS ANAEROBIC BOTTLE ONLY CRITICAL RESULT CALLED TO, READ BACK BY AND VERIFIED WITH: PHARMD [place] 9147 09/06/20 FCP    Culture (A)  Final    STENOTROPHOMONAS MALTOPHILIA SUSCEPTIBILITIES PERFORMED ON PREVIOUS CULTURE WITHIN THE LAST 5 DAYS. HOLDING FOR POSSIBLE ANAEROBE Performed at [facility], [place] [address] [place], [place] [address]    Report Status PENDING  Incomplete  Respiratory Panel by RT PCR (Flu A&B, Covid) - Nasopharyngeal Swab     Status: None   Collection Time: 09/01/20  3:34 AM   Specimen: Nasopharyngeal Swab  Result Value Ref Range Status   SARS Coronavirus 2 by RT PCR NEGATIVE NEGATIVE Final    Comment: (NOTE) SARS-CoV-2 target nucleic acids are NOT DETECTED.  The SARS-CoV-2 RNA is generally detectable in upper respiratoy specimens during the acute phase of infection. The lowest concentration of SARS-CoV-2 viral copies this assay can detect is 131 copies/mL. A negative result does not preclude SARS-Cov-2 infection and should not be used as the sole basis for treatment or other patient management decisions. A negative result may occur with  improper specimen collection/handling, submission of specimen other than nasopharyngeal swab, presence of viral mutation(s) within the areas targeted by this assay, and inadequate  number of viral copies (<131 copies/mL). A negative result must be combined with clinical observations, patient history, and epidemiological information. The expected result  is Negative.  Fact Sheet for Patients:  PinkCheek.be  Fact Sheet for Healthcare Providers:  GravelBags.it  This test is no t yet approved or cleared by the [place] FDA and  has been authorized for detection and/or diagnosis of SARS-CoV-2 by FDA under an Emergency Use Authorization (EUA). This EUA will remain  in effect (meaning this test can be used) for the duration of the COVID-19 declaration under Section 564(b)(1) of the Act, 21 U.S.C. section 360bbb-3(b)(1), unless the authorization is terminated or revoked sooner.     Influenza A by PCR NEGATIVE NEGATIVE Final   Influenza B by PCR NEGATIVE NEGATIVE Final    Comment: (NOTE) The Xpert Xpress SARS-CoV-2/FLU/RSV assay is intended as an aid in  the diagnosis of influenza from Nasopharyngeal swab specimens and  should not be used as a sole basis for treatment. Nasal washings and  aspirates are unacceptable for Xpert Xpress SARS-CoV-2/FLU/RSV  testing.  Fact Sheet for Patients: PinkCheek.be  Fact Sheet for Healthcare Providers: GravelBags.it  This test is not yet approved or cleared by the [place] FDA and  has been authorized for detection and/or diagnosis of SARS-CoV-2 by  FDA under an Emergency Use Authorization (EUA). This EUA will remain  in effect (meaning this test can be used) for the duration of the  Covid-19 declaration under Section 564(b)(1) of the Act, 21  U.S.C. section 360bbb-3(b)(1), unless the authorization is  terminated or revoked. Performed at [facility], [place] [address], [place], [place] [address]   Blood Culture (routine x 2)     Status: None   Collection Time: 09/01/20  5:58 AM   Specimen: BLOOD LEFT HAND  Result Value Ref Range Status   Specimen Description   Final    BLOOD LEFT HAND Performed at [facility] [address],  [place], [place] [address]    Special Requests   Final    BOTTLES DRAWN AEROBIC ONLY Blood Culture adequate volume Performed at [place] [address] [place], [place] [address]    Culture   Final    NO GROWTH 5 DAYS Performed at [facility], [place] [address] [place], [place] [address]    Report Status 09/06/2020 FINAL  Final     Radiology Studies: DG Abd Portable 1V  Result Date: 09/09/2020 CLINICAL DATA:  NG placement EXAM: PORTABLE ABDOMEN - 1 VIEW COMPARISON:  08/11/2020. FINDINGS: NG tube in the stomach. Tip is in the lateral proximal body of the stomach. Side hole near the GE junction. Moderate gas in the colon suggestive of ileus. This is unchanged. Right femoral venous catheter tip in the hepatic IVC. IMPRESSION: NG tip in the lateral stomach with the side hole near the GE junction. Recommend advancing NG. Electronically Signed   By: Franchot Gallo M.D.   On: 09/09/2020 07:58   DG Abd Portable 1V  Final Result    CT ABDOMEN PELVIS WO CONTRAST  Final Result    DG Abd Portable 1V-Small Bowel Obstruction Protocol-initial, 8 hr delay    (Results Pending)  DG Abd Portable 1V-Small Bowel Obstruction Protocol-24 hr delay    (Results Pending)    Scheduled Meds:  (feeding supplement) PROSource Plus  30 mL Oral BID BM   buPROPion  200 mg Oral Daily   Chlorhexidine Gluconate Cloth  6 each Topical Daily  cycloSPORINE  1 drop Both Eyes BID   diatrizoate meglumine-sodium  90 mL Per NG tube Once   dicyclomine  20 mg Oral TID   DULoxetine  90 mg Oral Daily   estradiol  2 mg Oral Daily   feeding supplement (KATE FARMS STANDARD 1.4)  325 mL Oral Daily   hydrALAZINE  50 mg Oral TID   metoprolol tartrate  100 mg Oral BID   pantoprazole (PROTONIX) IV  40 mg Intravenous Daily   polyvinyl alcohol  1 drop Both Eyes QID   sodium chloride flush  10-40 mL Intracatheter Q12H   PRN Meds: hydrALAZINE, HYDROmorphone (DILAUDID) injection, labetalol,  ondansetron (ZOFRAN) IV, promethazine, sodium chloride flush, traZODone Continuous Infusions:  lactated ringers 1,000 mL (09/09/20 0742)   levofloxacin (LEVAQUIN) IV 750 mg (09/08/20 1415)   sodium phosphate  Dextrose 5% IVPB     TPN CYCLIC-ADULT (ION)        LOS: 8 days  Time spent: Greater than 50% of the 35 minute visit was spent in counseling/coordination of care for the patient as laid out in the A&P.   Dwyane Dee, MD [facility] 09/09/2020, 12:47 PM

## 2020-09-09 NOTE — Progress Notes (Signed)
[facility]  Date of Admission:  08/31/2020      Lines: Old right femoral picc  Abx: 10/12-c levoflox renal dosing based on 750 mg dose  10/06-10/12 bactrim ds 2 tablet po q8hour   Assessment/Plan:  59yo F with crohn's with hx partial bowel resection, now TPN dependent with limited venous access, admitted 10/06 for sepsis found to have central line related stenotrophomonas bacteremia  Although only picc (10/5 and 10/06) cultures obtained, the resultant bcx no other bacteria,  and no other focus of infection.  Repeat bcx out of peripheral is ngtd on 10/07  tte no evidence valvular vegetation  She is tolerating bactrim high dose although her creatinine is slowly rising, switched to levoflox 10/12   Of note ucx with several CoNS species which are not pathogenic in her cases and represent assyptomatic bacteriuria  -------- Improving nausea/vomiting since ngt placement 10/14 evening. Pending SBFT Determined to get over this and go home   No change in id plan - continue levofloxacin 750 mg renal dosing (changed to IV on 10/13), to finish the 2 week course duration on 10/21 - maintain line for now unless recurrent episodes or fever (then will need simultaneous peripheral and picc blood culture)  -surgery following for probable partial SBO  - patient has ID clinic f/u with Dr Tommy Medal on 10/25 @ 245 for planned repeat surveillant blood cx off abx and general ID f/u    Principal Problem:   Bacteremia associated with intravascular line ([place]) Active Problems:   Crohn's colitis ([place])   Crohn disease ([place])   Short gut syndrome   On total parenteral nutrition (TPN)   Abnormal LFTs   Abdominal pain   Scheduled Meds: . (feeding supplement) PROSource Plus  30 mL Oral BID BM  . buPROPion  200 mg Oral Daily  . Chlorhexidine Gluconate Cloth  6 each Topical Daily  . cycloSPORINE  1 drop Both Eyes BID  . dicyclomine  20 mg Oral TID  .  DULoxetine  90 mg Oral Daily  . estradiol  2 mg Oral Daily  . feeding supplement (K[employer]ARMS STANDARD 1.4)  325 mL Oral Daily  . hydrALAZINE  50 mg Oral TID  . metoprolol tartrate  100 mg Oral BID  . pantoprazole (PROTONIX) IV  40 mg Intravenous Daily  . polyvinyl alcohol  1 drop Both Eyes QID  . sodium chloride flush  10-40 mL Intracatheter Q12H   Continuous Infusions: . lactated ringers 1,000 mL (09/09/20 0742)  . levofloxacin (LEVAQUIN) IV 750 mg (09/08/20 1415)  . sodium phosphate  Dextrose 5% IVPB    . TPN CYCLIC-ADULT (ION)     PRN Meds:.hydrALAZINE, HYDROmorphone (DILAUDID) injection, labetalol, ondansetron (ZOFRAN) IV, promethazine, sodium chloride flush, traZODone   SUBJECTIVE: Patient had NGT placed last evening for increasing nausea Pending small bowel follow through  Much less nauseous today.  Wants to get over this and go home No f/c  Review of Systems: ROS Negative 11 point ros unless mentioned above  Allergies  Allergen Reactions  . Cefepime Other (See Comments)    Neurotoxicity occurring in setting of AKI. Ceftriaxone tolerated during same admit  . Gabapentin Other (See Comments)    unknown  . Hyoscyamine Hives and Swelling    Legs swelling   Disorientation  . Lyrica [Pregabalin] Other (See Comments)    unknown  . Meperidine Hives    Other reaction(s): GI Upset Due to [place]   .  Topamax [Topiramate] Other (See Comments)    unknown  . Zosyn [Piperacillin Sod-Tazobactam So]     Patient reports it makes her vomit, her neck stiff, and her "heart feel funny"  . Fentanyl Rash    Pt is allergic to fentanyl patch related to the glue (gives her a rash) Pt states she is NOT allergic to fentanyl IV medicine  . Morphine And Related Rash    OBJECTIVE: Vitals:   09/08/20 1401 09/08/20 2144 09/08/20 2256 09/09/20 0615  BP: (!) 161/87 115/85  (!) 151/87  Pulse: 74 94  (!) 110  Resp: 18 16  15   Temp: 98.8 F (37.1 C) 98.5 F (36.9 C) 98.5 F (36.9 C)  98.7 F (37.1 C)  TempSrc: Oral Oral Oral Oral  SpO2: 99% 97%  99%  Weight:      Height:       Body mass index is 21.29 kg/m.  Physical Exam No distress; conversant Psych alert/oriented heent atraumatic; per; conj clear; NGT in place  cv rrr no mrg Lungs clear abd soft, mild epigastric tenderness, no guarding or rebound Ext no edema Skin no rash; Right femoral picc site no tenderness/purulence Neuro nonfocal  Lab Results Lab Results  Component Value Date   WBC 4.9 09/08/2020   HGB 9.4 (L) 09/08/2020   HCT 28.3 (L) 09/08/2020   MCV 89.8 09/08/2020   PLT 206 09/08/2020    Lab Results  Component Value Date   CREATININE 1.53 (H) 09/09/2020   BUN 49 (H) 09/09/2020   NA 133 (L) 09/09/2020   K 4.0 09/09/2020   CL 104 09/09/2020   CO2 21 (L) 09/09/2020    Lab Results  Component Value Date   ALT 89 (H) 09/08/2020   AST 31 09/08/2020   ALKPHOS 206 (H) 09/08/2020   BILITOT 0.5 09/08/2020     Microbiology: Recent Results (from the past 240 hour(s))  Blood culture (routine x 2)     Status: Abnormal   Collection Time: 08/30/20 10:50 PM   Specimen: BLOOD  Result Value Ref Range Status   Specimen Description   Final    BLOOD CENTRAL LINE Performed at [facility], [place] [address] [place], [place] [address]    Special Requests   Final    BOTTLES DRAWN AEROBIC AND ANAEROBIC Blood Culture adequate volume Performed at [facility] [address], [place], [place] [address]    Culture  Setup Time   Final    GRAM NEGATIVE RODS AEROBIC BOTTLE ONLY CRITICAL RESULT CALLED TO, READ BACK BY AND VERIFIED WITH: L ADKINS RN 08/31/20 2114 JDW Performed at [facility], [place] [address] [place], [place] [address]    Culture STENOTROPHOMONAS MALTOPHILIA (A)  Final   Report Status 09/02/2020 FINAL  Final   Organism ID, Bacteria STENOTROPHOMONAS MALTOPHILIA  Final      Susceptibility   Stenotrophomonas maltophilia - MIC*    LEVOFLOXACIN  0.5 SENSITIVE Sensitive     TRIMETH/SULFA <=20 SENSITIVE Sensitive     * STENOTROPHOMONAS MALTOPHILIA  Blood Culture ID Panel (Reflexed)     Status: Abnormal   Collection Time: 08/30/20 10:50 PM  Result Value Ref Range Status   Enterococcus faecalis NOT DETECTED NOT DETECTED Final   Enterococcus Faecium NOT DETECTED NOT DETECTED Final   Listeria monocytogenes NOT DETECTED NOT DETECTED Final   Staphylococcus species NOT DETECTED NOT DETECTED Final   Staphylococcus aureus (BCID) NOT DETECTED NOT DETECTED Final   Staphylococcus epidermidis NOT DETECTED NOT  DETECTED Final   Staphylococcus lugdunensis NOT DETECTED NOT DETECTED Final   Streptococcus species NOT DETECTED NOT DETECTED Final   Streptococcus agalactiae NOT DETECTED NOT DETECTED Final   Streptococcus pneumoniae NOT DETECTED NOT DETECTED Final   Streptococcus pyogenes NOT DETECTED NOT DETECTED Final   A.calcoaceticus-baumannii NOT DETECTED NOT DETECTED Final   Bacteroides fragilis NOT DETECTED NOT DETECTED Final   Enterobacterales NOT DETECTED NOT DETECTED Final   Enterobacter cloacae complex NOT DETECTED NOT DETECTED Final   Escherichia coli NOT DETECTED NOT DETECTED Final   Klebsiella aerogenes NOT DETECTED NOT DETECTED Final   Klebsiella oxytoca NOT DETECTED NOT DETECTED Final   Klebsiella pneumoniae NOT DETECTED NOT DETECTED Final   Proteus species NOT DETECTED NOT DETECTED Final   Salmonella species NOT DETECTED NOT DETECTED Final   Serratia marcescens NOT DETECTED NOT DETECTED Final   Haemophilus influenzae NOT DETECTED NOT DETECTED Final   Neisseria meningitidis NOT DETECTED NOT DETECTED Final   Pseudomonas aeruginosa NOT DETECTED NOT DETECTED Final   Stenotrophomonas maltophilia DETECTED (A) NOT DETECTED Final    Comment: CRITICAL RESULT CALLED TO, READ BACK BY AND VERIFIED WITH: L ADKINS RN 08/31/20 2114 JDW    Candida albicans NOT DETECTED NOT DETECTED Final   Candida auris NOT DETECTED NOT DETECTED Final   Candida  glabrata NOT DETECTED NOT DETECTED Final   Candida krusei NOT DETECTED NOT DETECTED Final   Candida parapsilosis NOT DETECTED NOT DETECTED Final   Candida tropicalis NOT DETECTED NOT DETECTED Final   Cryptococcus neoformans/gattii NOT DETECTED NOT DETECTED Final    Comment: Performed at [facility], [address], [place], [place] [address]  Respiratory Panel by RT PCR (Flu A&B, Covid) - Nasopharyngeal Swab     Status: None   Collection Time: 08/31/20 12:03 AM   Specimen: Nasopharyngeal Swab  Result Value Ref Range Status   SARS Coronavirus 2 by RT PCR NEGATIVE NEGATIVE Final    Comment: (NOTE) SARS-CoV-2 target nucleic acids are NOT DETECTED.  The SARS-CoV-2 RNA is generally detectable in upper respiratoy specimens during the acute phase of infection. The lowest concentration of SARS-CoV-2 viral copies this assay can detect is 131 copies/mL. A negative result does not preclude SARS-Cov-2 infection and should not be used as the sole basis for treatment or other patient management decisions. A negative result may occur with  improper specimen collection/handling, submission of specimen other than nasopharyngeal swab, presence of viral mutation(s) within the areas targeted by this assay, and inadequate number of viral copies (<131 copies/mL). A negative result must be combined with clinical observations, patient history, and epidemiological information. The expected result is Negative.  Fact Sheet for Patients:  PinkCheek.be  Fact Sheet for Healthcare Providers:  GravelBags.it  This test is no t yet approved or cleared by the [place] FDA and  has been authorized for detection and/or diagnosis of SARS-CoV-2 by FDA under an Emergency Use Authorization (EUA). This EUA will remain  in effect (meaning this test can be used) for the duration of the COVID-19 declaration under Section 564(b)(1) of the Act, 21  U.S.C. section 360bbb-3(b)(1), unless the authorization is terminated or revoked sooner.     Influenza A by PCR NEGATIVE NEGATIVE Final   Influenza B by PCR NEGATIVE NEGATIVE Final    Comment: (NOTE) The Xpert Xpress SARS-CoV-2/FLU/RSV assay is intended as an aid in  the diagnosis of influenza from Nasopharyngeal swab specimens and  should not be used as a sole basis for treatment. Nasal washings and  aspirates are unacceptable for Xpert Xpress SARS-CoV-2/FLU/RSV  testing.  Fact Sheet for Patients: PinkCheek.be  Fact Sheet for Healthcare Providers: GravelBags.it  This test is not yet approved or cleared by the [place] FDA and  has been authorized for detection and/or diagnosis of SARS-CoV-2 by  FDA under an Emergency Use Authorization (EUA). This EUA will remain  in effect (meaning this test can be used) for the duration of the  Covid-19 declaration under Section 564(b)(1) of the Act, 21  U.S.C. section 360bbb-3(b)(1), unless the authorization is  terminated or revoked. Performed at [facility], [place] [address] [place], [place] [address]   Urine culture     Status: Abnormal   Collection Time: 08/31/20  3:30 AM   Specimen: In/Out Cath Urine  Result Value Ref Range Status   Specimen Description   Final    IN/OUT CATH URINE Performed at [facility] [address], [place], [place] [address]    Special Requests   Final    NONE Performed at [facility], [place] [address] [place], [place] [address]    Culture (A)  Final    100 COLONIES/mL STAPHYLOCOCCUS EPIDERMIDIS 100 COLONIES/mL STAPHYLOCOCCUS WARNERI    Report Status 09/02/2020 FINAL  Final   Organism ID, Bacteria STAPHYLOCOCCUS EPIDERMIDIS (A)  Final   Organism ID, Bacteria STAPHYLOCOCCUS WARNERI (A)  Final      Susceptibility   Staphylococcus epidermidis - MIC*    CIPROFLOXACIN >=8 RESISTANT  Resistant     GENTAMICIN 8 INTERMEDIATE Intermediate     NITROFURANTOIN <=16 SENSITIVE Sensitive     OXACILLIN >=4 RESISTANT Resistant     TETRACYCLINE <=1 SENSITIVE Sensitive     VANCOMYCIN 2 SENSITIVE Sensitive     TRIMETH/SULFA 80 RESISTANT Resistant     CLINDAMYCIN >=8 RESISTANT Resistant     RIFAMPIN <=0.5 SENSITIVE Sensitive     Inducible Clindamycin NEGATIVE Sensitive     * 100 COLONIES/mL STAPHYLOCOCCUS EPIDERMIDIS   Staphylococcus warneri - MIC*    CIPROFLOXACIN <=0.5 SENSITIVE Sensitive     GENTAMICIN <=0.5 SENSITIVE Sensitive     NITROFURANTOIN 32 SENSITIVE Sensitive     OXACILLIN <=0.25 SENSITIVE Sensitive     TETRACYCLINE <=1 SENSITIVE Sensitive     VANCOMYCIN 1 SENSITIVE Sensitive     TRIMETH/SULFA <=10 SENSITIVE Sensitive     CLINDAMYCIN >=8 RESISTANT Resistant     RIFAMPIN <=0.5 SENSITIVE Sensitive     Inducible Clindamycin NEGATIVE Sensitive     * 100 COLONIES/mL STAPHYLOCOCCUS WARNERI  Blood Culture (routine x 2)     Status: Abnormal   Collection Time: 08/31/20 11:35 PM   Specimen: BLOOD  Result Value Ref Range Status   Specimen Description   Final    BLOOD PICC LINE Performed at [facility], [place] [address], [place], [place] [address]    Special Requests   Final    BOTTLES DRAWN AEROBIC AND ANAEROBIC Blood Culture adequate volume Performed at [facility] [address], [place], [place] [address]    Culture  Setup Time   Final    GRAM NEGATIVE RODS AEROBIC BOTTLE ONLY CRITICAL VALUE NOTED.  VALUE IS CONSISTENT WITH PREVIOUSLY REPORTED AND CALLED VALUE. GRAM POSITIVE RODS ANAEROBIC BOTTLE ONLY CRITICAL RESULT CALLED TO, READ BACK BY AND VERIFIED WITH: PHARMD [place] 7628 09/06/20 FCP    Culture (A)  Final    STENOTROPHOMONAS MALTOPHILIA SUSCEPTIBILITIES PERFORMED ON PREVIOUS CULTURE WITHIN THE LAST 5 DAYS. RARE PROPIONIBACTERIUM ACNES Standardized  susceptibility testing for this organism is not  available. Performed at [facility], [place] [address] [place], [place] [address]    Report Status 09/09/2020 FINAL  Final  Respiratory Panel by RT PCR (Flu A&B, Covid) - Nasopharyngeal Swab     Status: None   Collection Time: 09/01/20  3:34 AM   Specimen: Nasopharyngeal Swab  Result Value Ref Range Status   SARS Coronavirus 2 by RT PCR NEGATIVE NEGATIVE Final    Comment: (NOTE) SARS-CoV-2 target nucleic acids are NOT DETECTED.  The SARS-CoV-2 RNA is generally detectable in upper respiratoy specimens during the acute phase of infection. The lowest concentration of SARS-CoV-2 viral copies this assay can detect is 131 copies/mL. A negative result does not preclude SARS-Cov-2 infection and should not be used as the sole basis for treatment or other patient management decisions. A negative result may occur with  improper specimen collection/handling, submission of specimen other than nasopharyngeal swab, presence of viral mutation(s) within the areas targeted by this assay, and inadequate number of viral copies (<131 copies/mL). A negative result must be combined with clinical observations, patient history, and epidemiological information. The expected result is Negative.  Fact Sheet for Patients:  PinkCheek.be  Fact Sheet for Healthcare Providers:  GravelBags.it  This test is no t yet approved or cleared by the [place] FDA and  has been authorized for detection and/or diagnosis of SARS-CoV-2 by FDA under an Emergency Use Authorization (EUA). This EUA will remain  in effect (meaning this test can be used) for the duration of the COVID-19 declaration under Section 564(b)(1) of the Act, 21 U.S.C. section 360bbb-3(b)(1), unless the authorization is terminated or revoked sooner.     Influenza A by PCR NEGATIVE NEGATIVE Final   Influenza B by PCR NEGATIVE NEGATIVE Final    Comment: (NOTE) The Xpert Xpress  SARS-CoV-2/FLU/RSV assay is intended as an aid in  the diagnosis of influenza from Nasopharyngeal swab specimens and  should not be used as a sole basis for treatment. Nasal washings and  aspirates are unacceptable for Xpert Xpress SARS-CoV-2/FLU/RSV  testing.  Fact Sheet for Patients: PinkCheek.be  Fact Sheet for Healthcare Providers: GravelBags.it  This test is not yet approved or cleared by the [place] FDA and  has been authorized for detection and/or diagnosis of SARS-CoV-2 by  FDA under an Emergency Use Authorization (EUA). This EUA will remain  in effect (meaning this test can be used) for the duration of the  Covid-19 declaration under Section 564(b)(1) of the Act, 21  U.S.C. section 360bbb-3(b)(1), unless the authorization is  terminated or revoked. Performed at [facility], [place] [address] [place], [place] [address]   Blood Culture (routine x 2)     Status: None   Collection Time: 09/01/20  5:58 AM   Specimen: BLOOD LEFT HAND  Result Value Ref Range Status   Specimen Description   Final    BLOOD LEFT HAND Performed at [facility] [address], [place], [place] [address]    Special Requests   Final    BOTTLES DRAWN AEROBIC ONLY Blood Culture adequate volume Performed at [facility] [address], [place], [place] [address]    Culture   Final    NO GROWTH 5 DAYS Performed at [facility], [place] [address] [place], [place] [address]    Report Status 09/06/2020 FINAL  Final     Imaging: 10/13 abd pelv ct no contrast 1. There are  signs of multiple bowel resections involving both large and small bowel loops with resultant complex bowel anatomy. Evaluation of the bowel loops is further complicated by lack of IV and oral contrast material. Several dilated loops of small bowel are noted in the right abdomen with air-fluid levels. Cannot  exclude partial small bowel obstruction. If there is a continued concern for bowel obstruction and location of possible transition point is of clinical concern then follow-up imaging after administration of oral and IV contrast material is recommended. 2. Large stool burden identified within the distal transverse colon, splenic flexure and descending colon. There are several intervening segments of gas distended colon between stool filled loops of distal colon. Findings may reflect constipation. 3. Pneumobilia compatible with biliary patency. 4. Right-sided central venous catheter with the tip in a branch of the hepatic vein. 5. Aortic atherosclerosis.  Jabier Mutton, [facility] for [facility] [place] [phone] pager    09/09/2020, 3:03 PM

## 2020-09-10 ENCOUNTER — Inpatient Hospital Stay (HOSPITAL_COMMUNITY): Payer: Medicare Other

## 2020-09-10 DIAGNOSIS — R109 Unspecified abdominal pain: Secondary | ICD-10-CM | POA: Diagnosis not present

## 2020-09-10 DIAGNOSIS — T827XXD Infection and inflammatory reaction due to other cardiac and vascular devices, implants and grafts, subsequent encounter: Secondary | ICD-10-CM | POA: Diagnosis not present

## 2020-09-10 DIAGNOSIS — N179 Acute kidney failure, unspecified: Secondary | ICD-10-CM

## 2020-09-10 DIAGNOSIS — R7881 Bacteremia: Secondary | ICD-10-CM | POA: Diagnosis not present

## 2020-09-10 LAB — MAGNESIUM: Magnesium: 2.2 mg/dL (ref 1.7–2.4)

## 2020-09-10 LAB — BASIC METABOLIC PANEL
Anion gap: 6 (ref 5–15)
BUN: 54 mg/dL — ABNORMAL HIGH (ref 6–20)
CO2: 24 mmol/L (ref 22–32)
Calcium: 9.4 mg/dL (ref 8.9–10.3)
Chloride: 107 mmol/L (ref 98–111)
Creatinine, Ser: 1.8 mg/dL — ABNORMAL HIGH (ref 0.44–1.00)
GFR, Estimated: 30 mL/min — ABNORMAL LOW (ref >60)
Glucose, Bld: 119 mg/dL — ABNORMAL HIGH (ref 70–99)
Potassium: 3.8 mmol/L (ref 3.5–5.1)
Sodium: 137 mmol/L (ref 135–145)

## 2020-09-10 LAB — PHOSPHORUS: Phosphorus: 2.6 mg/dL (ref 2.5–4.6)

## 2020-09-10 LAB — GLUCOSE, CAPILLARY
Glucose-Capillary: 119 mg/dL — ABNORMAL HIGH (ref 70–99)
Glucose-Capillary: 117 mg/dL — ABNORMAL HIGH (ref 70–99)

## 2020-09-10 MED ORDER — TRAVASOL 10 % IV SOLN
INTRAVENOUS | Status: AC
  Filled 2020-09-10: qty 756

## 2020-09-10 MED ORDER — TRAVASOL 10 % IV SOLN
INTRAVENOUS | Status: DC
  Filled 2020-09-10: qty 756

## 2020-09-10 MED ORDER — SODIUM PHOSPHATES 45 MMOLE/15ML IV SOLN
20.0000 mmol | Freq: Once | INTRAVENOUS | Status: AC
  Administered 2020-09-10: 20 mmol via INTRAVENOUS
  Filled 2020-09-10: qty 6.67

## 2020-09-10 NOTE — Progress Notes (Signed)
Subjective/Chief Complaint: feel better  Feels better some flatus  SBP shows contrast in the colon    Objective: Vital signs in last 24 hours: Temp:  [98.5 F (36.9 C)] 98.5 F (36.9 C) (10/16 0456) Pulse Rate:  [43-68] 43 (10/16 0456) Resp:  [15-16] 15 (10/16 0456) BP: (99-127)/(53-54) 127/54 (10/16 0456) SpO2:  [98 %] 98 % (10/16 0456) Last BM Date: 09/09/20  Intake/Output from previous day: 10/15 0701 - 10/16 0700 In: 867.5 [P.O.:240; IV Piggyback:627.5] Out: 800 [Emesis/NG output:800] Intake/Output this shift: No intake/output data recorded.   General: pleasant, WD, thin female who is laying in bed in NAD Heart: regular, rate, and rhythm. Palpable radial and pedal pulses bilaterally Lungs: CTAB, no wheezes, rhonchi, or rales noted.  Respiratory effort nonlabored Abd: soft, ttp along midline scar, ND, +BS, NGT with minimal bilious drainage  Lab Results:  Recent Labs    09/08/20 0448  WBC 4.9  HGB 9.4*  HCT 28.3*  PLT 206   BMET Recent Labs    09/09/20 0400 09/10/20 0520  NA 133* 137  K 4.0 3.8  CL 104 107  CO2 21* 24  GLUCOSE 137* 119*  BUN 49* 54*  CREATININE 1.53* 1.80*  CALCIUM 10.0 9.4   PT/INR No results for input(s): LABPROT, INR in the last 72 hours. ABG No results for input(s): PHART, HCO3 in the last 72 hours.  Invalid input(s): PCO2, PO2  Studies/Results: DG Abd Portable 1V-Small Bowel Obstruction Protocol-initial, 8 hr delay  Result Date: 09/09/2020 CLINICAL DATA:  8 hour delay protocol. EXAM: PORTABLE ABDOMEN - 1 VIEW COMPARISON:  X-ray dated 09/09/2020 FINDINGS: The NG tube projects over the stomach. Oral contrast is noted within the small bowel and colon. Again noted is a femoral approach tunneled venous catheter. There is no pneumatosis or free air. IMPRESSION: Oral contrast is noted within the small bowel and colon. Electronically Signed   By: Constance Holster M.D.   On: 09/09/2020 21:21   DG Abd Portable 1V  Result Date:  09/09/2020 CLINICAL DATA:  NG placement EXAM: PORTABLE ABDOMEN - 1 VIEW COMPARISON:  08/11/2020. FINDINGS: NG tube in the stomach. Tip is in the lateral proximal body of the stomach. Side hole near the GE junction. Moderate gas in the colon suggestive of ileus. This is unchanged. Right femoral venous catheter tip in the hepatic IVC. IMPRESSION: NG tip in the lateral stomach with the side hole near the GE junction. Recommend advancing NG. Electronically Signed   By: Franchot Gallo M.D.   On: 09/09/2020 07:58    Anti-infectives: Anti-infectives (From admission, onward)   Start     Dose/Rate Route Frequency Ordered Stop   09/08/20 1400  levofloxacin (LEVAQUIN) IVPB 750 mg        750 mg 100 mL/hr over 90 Minutes Intravenous Every 48 hours 09/07/20 1550 09/16/20 1359   09/06/20 1400  levofloxacin (LEVAQUIN) tablet 750 mg  Status:  Discontinued        750 mg Oral Every 48 hours 09/06/20 0912 09/07/20 1550   09/04/20 1400  sulfamethoxazole-trimethoprim (BACTRIM DS) 800-160 MG per tablet 2 tablet  Status:  Discontinued        2 tablet Oral Every 8 hours 09/04/20 1006 09/06/20 0734   09/04/20 1000  sulfamethoxazole-trimethoprim (BACTRIM DS) 800-160 MG per tablet 1 tablet  Status:  Discontinued        1 tablet Oral Every 12 hours 09/04/20 0945 09/04/20 1004   09/01/20 1600  sulfamethoxazole-trimethoprim (BACTRIM) 320 mg in dextrose 5 %  500 mL IVPB  Status:  Discontinued        320 mg 346.7 mL/hr over 90 Minutes Intravenous Every 8 hours 09/01/20 0819 09/04/20 0945   09/01/20 0000  sulfamethoxazole-trimethoprim (BACTRIM) 317.44 mg in dextrose 5 % 500 mL IVPB  Status:  Discontinued        15 mg/kg/day  63.5 kg 346.6 mL/hr over 90 Minutes Intravenous Every 8 hours 08/31/20 2356 09/01/20 0819      Assessment/Plan:  CKD stage III Bacteremia - stenotrophomonas maltophilia 10/6 Crohn's Disease- diagnosed 1980's Short gut syndrome - last abdominal surgery 2018 at [facility], per chart review has  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 remaining with a jejunal to transverse colon anastomosis. TPN dependent. Per patient she was not a candidate for bowel transplant while in [place] but I do think it may be worth while for her to get another opinion from a tertiary/academic center with small bowel transplant service in the future, given she has more family support here. At this time she is not open to abdominal surgery so we would need to discuss this with the patient in more detail.  Possible pSBO - afebrile, VSS - NGT placed overnight, abdominal exam remains fairly benign - SBO protocol shows contrast in the colon Pt wants to keep it for today  Hold on diet for today  Remove NGT tomorrow and start clears depending on how she feels  - mobilize as able - no acute surgical needs, would not recommend any surgical interventions unless the patient had signs of ischemia or perforation, given her multiple prior surgeries and likely extensive adhesive disease  FEN: TNP, NGT on LIWS ID: [email or web address] VTE: SCD's, ok for chemical VTE from surgical perspective Foley: none   LOS: 9 days    Turner Daniels MD 09/10/2020

## 2020-09-10 NOTE — Progress Notes (Signed)
PROGRESS NOTE    Caitlin Peters   GUR:[id number]  DOB: Aug 07, 1961  DOA: 08/31/2020     9  PCP: [name], [name], MD  CC: fever, chills   Hospital Course: Caitlin Peters is a 59 yo female with complex PMH, notably Crohn's disease (s/p multiple surgeries) now with short gut syndrome and on chronic long term TPN (since 2003 per patient). She also has had multiple PICC line infections and has had to have several removed from throughout her body over the years and has multiple scars to show where previous lines have been.  She had her most recent PICC placed in the right femoral vein on 08/05/20. She is admitted to the hospital on 09/01/20 with another bacteremia (Stenotrophomonas) and was started on Bactrim, but developed AKI and was then changed to Levaquin.   Furthermore, on 09/07/20 she developed acute abdominal pain on top of her chronic pain she lives with. She was unable to tolerate pills nor oral contrast and due to resolving AKI did not receive IV contrast either; but a CT abd/pelvis was obtained which revealed concern for underlying SBO. Surgery was consulted for further evaluation and assistance given her complex underlying history.  After evaluation, the patient decided she did not want any further surgeries especially if not emergently needed.  It was decided to manage her supportively and if she developed any further nausea and/or vomiting, then to place NG tube. On the night of 09/08/2020, she became more nauseous and had severe vomiting.  NG tube was then placed.  She noticed improvement in her symptoms after this.   Interval History:  NGT putting out much more now after tube adjusted yesterday. Pain and nausea also seem better but she has NGT in for this and is getting ~8 doses of IV dilaudid 1.5 mg throughout the day and night for pain.   Old records reviewed in assessment of this patient  ROS: Constitutional: positive for anorexia, fatigue, malaise and weight loss,  Respiratory: negative for cough, Cardiovascular: negative for chest pain and Gastrointestinal: positive for abdominal pain and nausea  Assessment & Plan: * Bacteremia associated with intravascular line ([name]) - right femoral PICC placed 08/05/20; now with Stenotrophomonas bacteremia; ID following - did not tolerate Bactrim due to AKI, now on Levaquin - patient has had recurrent infections/bacteremias; we are attempting to treat with PICC in place; clinically she's at least tolerating and responding fairly well (e.g. no fevers, she's chronically neutropenic so difficult to even know if sign of sepsis for her, but her differential is relatively reassuring) - tentative plan is to leave PICC in place and continue Levaquin until 10/21 for 14 day course - blood culture from 10/7 is negative  - if fever repeat blood cultures (PICC and PIV) - outpatient ID f/u on 10/25  Abdominal pain - CT abdomen/pelvis performed on 09/07/2020 reveals possible obstruction.  Her underlying anatomy complicates the picture with interpretation.  Greatly appreciate surgery assistance in the management of her care.  Supportive and symptomatic management is ultimate goal.  Patient declining surgery unless absolutely necessary -NG tube required evening of 09/08/2020; 800 cc output yesterday, 09/09/2020 -SBO protocol ordered per surgery -Follow-up Gastrografin study; no leakage/perforation appreciated.  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 Patient relocated from [place] to [place] to be closer to family - for now, continue TPN for short gut syndrome - long term prognosis is poor (multiple hospitalizations, infections, weight loss/malnutrition, chronic pain, poor quality of life)  On total parenteral nutrition (TPN) - R femoral PICC in place  - continue TPN  Acute renal failure superimposed on stage 3b chronic kidney disease ([name])-resolved as of 09/09/2020 - baseline probably close to 1.5 creatinine; mild increase from Bactrim which was changed to Levaquin for above infection - creatinine appears to have peaked at 2.02, now has trended back to baseline - continue IVF when TPN is off and trend BMP  Abnormal LFTs - possibly related to TPN as well - intermittent CMP  Short gut syndrome -See Crohn's disease  Sepsis ([name])-resolved as of 09/07/2020 - see bacteremia   Antimicrobials: Bactrim 09/01/2020>> 09/06/2020 Levaquin 09/06/2020>> present  DVT prophylaxis: SCD Code Status: Full Family Communication: none present Disposition Plan: Status is: Inpatient  Remains inpatient appropriate because:Ongoing active pain requiring inpatient pain management, Unsafe d/c plan, IV treatments appropriate due to intensity of illness or inability to take PO and Inpatient level of care appropriate due to severity of illness   Dispo: The patient is from: Home              Anticipated d/c is to: Home              Anticipated d/c date is: > 3 days              Patient currently is not medically stable to d/c.   Objective: Blood pressure (!) 127/54, pulse (!) 43, temperature 98.5 F (36.9 C), resp. rate 15, height 5' 8"  (1.727 m), weight 63.5 kg, SpO2 98 %.  Examination: General appearance: more comfortable appearing woman laying in bed in no distress but still in pain Head: Normocephalic, without obvious abnormality, atraumatic Eyes: EOMI Lungs: clear to auscultation bilaterally Heart: regular rate and rhythm and S1, S2  normal Abdomen: Large midline abdominal scar noted from multiple prior surgeries.  BS more active today in all 4 quadrants but still TTP throughout which is almost close to baseline she says Extremities: Thin, no edema Skin: dry, scaly Neurologic: Grossly normal  Consultants:   ID  Surgery  Procedures:   n/a  Data Reviewed: I have personally reviewed following labs and imaging studies Results for orders placed or performed during the hospital encounter of 08/31/20 (from the past 24 hour(s))  Glucose, capillary     Status: Abnormal   Collection Time: 09/09/20 11:36 AM  Result Value Ref Range   Glucose-Capillary 117 (H) 70 - 99 mg/dL  Glucose, capillary     Status: None   Collection Time: 09/09/20  5:31 PM  Result Value Ref Range   Glucose-Capillary 90 70 - 99 mg/dL  Glucose, capillary     Status: Abnormal   Collection Time: 09/09/20 11:55 PM  Result Value Ref Range   Glucose-Capillary 122 (H) 70 - 99 mg/dL   Comment 1 Notify RN   Glucose, capillary     Status: Abnormal   Collection Time: 09/10/20  4:57 AM  Result Value Ref Range   Glucose-Capillary 119 (H) 70 - 99 mg/dL   Comment 1 Notify RN   Basic metabolic panel     Status:  Abnormal   Collection Time: 09/10/20  5:20 AM  Result Value Ref Range   Sodium 137 135 - 145 mmol/L   Potassium 3.8 3.5 - 5.1 mmol/L   Chloride 107 98 - 111 mmol/L   CO2 24 22 - 32 mmol/L   Glucose, Bld 119 (H) 70 - 99 mg/dL   BUN 54 (H) 6 - 20 mg/dL   Creatinine, Ser 1.80 (H) 0.44 - 1.00 mg/dL   Calcium 9.4 8.9 - 10.3 mg/dL   GFR, Estimated 30 (L) >60 mL/min   Anion gap 6 5 - 15  Phosphorus     Status: None   Collection Time: 09/10/20  5:20 AM  Result Value Ref Range   Phosphorus 2.6 2.5 - 4.6 mg/dL  Magnesium     Status: None   Collection Time: 09/10/20  5:20 AM  Result Value Ref Range   Magnesium 2.2 1.7 - 2.4 mg/dL    Recent Results (from the past 240 hour(s))  Blood Culture (routine x 2)     Status: Abnormal   Collection Time:  08/31/20 11:35 PM   Specimen: BLOOD  Result Value Ref Range Status   Specimen Description   Final    BLOOD PICC LINE Performed at [facility], [place] [address], [place], [place] [address]    Special Requests   Final    BOTTLES DRAWN AEROBIC AND ANAEROBIC Blood Culture adequate volume Performed at [facility] [address], [place], [place] [address]    Culture  Setup Time   Final    GRAM NEGATIVE RODS AEROBIC BOTTLE ONLY CRITICAL VALUE NOTED.  VALUE IS CONSISTENT WITH PREVIOUSLY REPORTED AND CALLED VALUE. GRAM POSITIVE RODS ANAEROBIC BOTTLE ONLY CRITICAL RESULT CALLED TO, READ BACK BY AND VERIFIED WITH: PHARMD [place] [id number] 09/06/20 FCP    Culture (A)  Final    STENOTROPHOMONAS MALTOPHILIA SUSCEPTIBILITIES PERFORMED ON PREVIOUS CULTURE WITHIN THE LAST 5 DAYS. RARE PROPIONIBACTERIUM ACNES Standardized susceptibility testing for this organism is not available. Performed at [facility], [place] [address], [place], [place] [address]    Report Status 09/09/2020 FINAL  Final  Respiratory Panel by RT PCR (Flu A&B, Covid) - Nasopharyngeal Swab     Status: None   Collection Time: 09/01/20  3:34 AM   Specimen: Nasopharyngeal Swab  Result Value Ref Range Status   SARS Coronavirus 2 by RT PCR NEGATIVE NEGATIVE Final    Comment: (NOTE) SARS-CoV-2 target nucleic acids are NOT DETECTED.  The SARS-CoV-2 RNA is generally detectable in upper respiratoy specimens during the acute phase of infection. The lowest concentration of SARS-CoV-2 viral copies this assay can detect is 131 copies/mL. A negative result does not preclude SARS-Cov-2 infection and should not be used as the sole basis for treatment or other patient management decisions. A negative result may occur with  improper specimen collection/handling, submission of specimen other than nasopharyngeal swab, presence of viral mutation(s) within the areas targeted by this assay, and  inadequate number of viral copies (<131 copies/mL). A negative result must be combined with clinical observations, patient history, and epidemiological information. The expected result is Negative.  Fact Sheet for Patients:  PinkCheek.be  Fact Sheet for Healthcare Providers:  GravelBags.it  This test is no t yet approved or cleared by the [place] FDA and  has been authorized for detection and/or diagnosis of SARS-CoV-2 by FDA under an Emergency Use Authorization (EUA). This EUA will remain  in effect (meaning this test can be used) for  the duration of the COVID-19 declaration under Section 564(b)(1) of the Act, 21 U.S.C. section 360bbb-3(b)(1), unless the authorization is terminated or revoked sooner.     Influenza A by PCR NEGATIVE NEGATIVE Final   Influenza B by PCR NEGATIVE NEGATIVE Final    Comment: (NOTE) The Xpert Xpress SARS-CoV-2/FLU/RSV assay is intended as an aid in  the diagnosis of influenza from Nasopharyngeal swab specimens and  should not be used as a sole basis for treatment. Nasal washings and  aspirates are unacceptable for Xpert Xpress SARS-CoV-2/FLU/RSV  testing.  Fact Sheet for Patients: PinkCheek.be  Fact Sheet for Healthcare Providers: GravelBags.it  This test is not yet approved or cleared by the [place] FDA and  has been authorized for detection and/or diagnosis of SARS-CoV-2 by  FDA under an Emergency Use Authorization (EUA). This EUA will remain  in effect (meaning this test can be used) for the duration of the  Covid-19 declaration under Section 564(b)(1) of the Act, 21  U.S.C. section 360bbb-3(b)(1), unless the authorization is  terminated or revoked. Performed at [facility], [place] [address], [place], [place] [address]   Blood Culture (routine x 2)     Status: None   Collection Time: 09/01/20  5:58  AM   Specimen: BLOOD LEFT HAND  Result Value Ref Range Status   Specimen Description   Final    BLOOD LEFT HAND Performed at [facility] [address], [place], [place] [address]    Special Requests   Final    BOTTLES DRAWN AEROBIC ONLY Blood Culture adequate volume Performed at [facility] [address] [place], [place] [address]    Culture   Final    NO GROWTH 5 DAYS Performed at [facility], [place] [address] [place], [place] [address]    Report Status 09/06/2020 FINAL  Final     Radiology Studies: DG Abd Portable 1V-Small Bowel Obstruction Protocol-initial, 8 hr delay  Result Date: 09/09/2020 CLINICAL DATA:  8 hour delay protocol. EXAM: PORTABLE ABDOMEN - 1 VIEW COMPARISON:  X-ray dated 09/09/2020 FINDINGS: The NG tube projects over the stomach. Oral contrast is noted within the small bowel and colon. Again noted is a femoral approach tunneled venous catheter. There is no pneumatosis or free air. IMPRESSION: Oral contrast is noted within the small bowel and colon. Electronically Signed   By: Constance Holster M.D.   On: 09/09/2020 21:21   DG Abd Portable 1V  Result Date: 09/09/2020 CLINICAL DATA:  NG placement EXAM: PORTABLE ABDOMEN - 1 VIEW COMPARISON:  08/11/2020. FINDINGS: NG tube in the stomach. Tip is in the lateral proximal body of the stomach. Side hole near the GE junction. Moderate gas in the colon suggestive of ileus. This is unchanged. Right femoral venous catheter tip in the hepatic IVC. IMPRESSION: NG tip in the lateral stomach with the side hole near the GE junction. Recommend advancing NG. Electronically Signed   By: Franchot Gallo M.D.   On: 09/09/2020 07:58   DG Abd Portable 1V-Small Bowel Obstruction Protocol-initial, 8 hr delay  Final Result    DG Abd Portable 1V  Final Result    CT ABDOMEN PELVIS WO CONTRAST  Final Result    DG Abd Portable 1V-Small Bowel Obstruction Protocol-24 hr delay    (Results  Pending)    Scheduled Meds: . (feeding supplement) PROSource Plus  30 mL Oral BID BM  . buPROPion  200 mg Oral Daily  . Chlorhexidine Gluconate Cloth  6 each Topical Daily  . cycloSPORINE  1 drop Both Eyes BID  . dicyclomine  20 mg Oral TID  . DULoxetine  90 mg Oral Daily  . estradiol  2 mg Oral Daily  . feeding supplement ([employer] STANDARD 1.4)  325 mL Oral Daily  . hydrALAZINE  50 mg Oral TID  . metoprolol tartrate  100 mg Oral BID  . pantoprazole (PROTONIX) IV  40 mg Intravenous Daily  . polyvinyl alcohol  1 drop Both Eyes QID  . sodium chloride flush  10-40 mL Intracatheter Q12H   PRN Meds: hydrALAZINE, HYDROmorphone (DILAUDID) injection, labetalol, ondansetron (ZOFRAN) IV, promethazine, sodium chloride flush, traZODone Continuous Infusions: . lactated ringers 1,000 mL (09/10/20 0823)  . levofloxacin (LEVAQUIN) IV 750 mg (09/08/20 1415)  . sodium phosphate  Dextrose 5% IVPB 20 mmol (09/10/20 0834)  . TPN CYCLIC-ADULT (ION)        LOS: 9 days  Time spent: Greater than 50% of the 35 minute visit was spent in counseling/coordination of care for the patient as laid out in the A&P.   Dwyane Dee, MD [facility] 09/10/2020, 8:51 AM

## 2020-09-10 NOTE — Progress Notes (Signed)
Patient receiving cyclic TNA. Noted TNA bag still half full. Rate currently infusing@69cc /hr. Pharmacy notified. Pharmacist Erin instructed to continue cyclic TNA@69cc /hr until current bag empty or new bag will be hung@1800 .

## 2020-09-10 NOTE — Progress Notes (Addendum)
PHARMACY - TOTAL PARENTERAL NUTRITION CONSULT NOTE   Indication: on TPN prior to admission with hx Crohn's disease with short gut syndrome  Patient Measurements: Height: 5' 8"  (172.7 cm) Weight: 63.5 kg (140 lb) IBW/kg (Calculated) : 63.9 TPN AdjBW (KG): 63.5 Body mass index is 21.29 kg/m.  Assessment: TPN was originally on hold due to possibility of PICC removal but ID has decided that will try and keep the line for now and treat bacteremia. Per Dr. Linus Salmons on 10/10, ok to resume TPN through current PICC line.  Glucose / Insulin:  SSI discontinued on 10/11 given stable CBGs. CBG from previous 24 hrs are WNL (goal 100-150) including a BG check while TPN off.  Electrolytes: all WNL; sodium phos supplement order from yesterday was not charted as given Renal: SCr increased 1.8, BUN 54. No UOP recorded (accurate?) LFTs / TGs: AST WNL, ALT trending down; TG WNL. Lipids added back to TPN on 10/11 Prealbumin / albumin: 30.5/3.2 Intake / Output; MIVF: LR @ 100 mL/hr while TPN is off per MD -Unmeasured UOP x4 GI Imaging:  Surgeries / Procedures: n/a  Central access: PICC line TPN start date: on PTA  Nutritional Goals (per RD recommendation on 10/14): kCal: 1905-2100, Protein: 95-110, Fluid: >= 2L/day  Per home TPN formula, 1800 mL over 14 hours to provide 75g protein and 1081 kcal with no lipid and when lipid added to TPN, provides 75g protein and 1481 kcal  Current Nutrition:  CLD for comfort + cyclic TPN. NGT placed 10/15 -Prosource 30 mL BID, [employer] 1.4 qday per RD  Plan:  At 1800:  1800 mL of TPN over 14 hours to provide 75g protein and 1458 Kcal   Electrolytes in TPN: increase K+  Na+ 130 mEq/L  K+ 25 mEq/L  No Ca++  Mg++ 5 mEq/L  No Phos+  Cl:Ac ratio 1:2  MVI liquid stopped by MD so will add to TPN, trace elements added  LR @ 100 mL/hr when TPN off per MD  Monitor TPN labs on Mon/Thurs; recheck electrolytes with AM labs tomorrow  Peggyann Juba, PharmD,  [place]: [phone] 09/10/20 8:05 AM

## 2020-09-11 DIAGNOSIS — T827XXD Infection and inflammatory reaction due to other cardiac and vascular devices, implants and grafts, subsequent encounter: Secondary | ICD-10-CM | POA: Diagnosis not present

## 2020-09-11 DIAGNOSIS — R7881 Bacteremia: Secondary | ICD-10-CM | POA: Diagnosis not present

## 2020-09-11 DIAGNOSIS — R109 Unspecified abdominal pain: Secondary | ICD-10-CM | POA: Diagnosis not present

## 2020-09-11 LAB — MAGNESIUM: Magnesium: 1.8 mg/dL (ref 1.7–2.4)

## 2020-09-11 LAB — COMPREHENSIVE METABOLIC PANEL
ALT: 47 U/L — ABNORMAL HIGH (ref 0–44)
AST: 25 U/L (ref 15–41)
Albumin: 2.7 g/dL — ABNORMAL LOW (ref 3.5–5.0)
Alkaline Phosphatase: 144 U/L — ABNORMAL HIGH (ref 38–126)
Anion gap: 7 (ref 5–15)
BUN: 56 mg/dL — ABNORMAL HIGH (ref 6–20)
CO2: 29 mmol/L (ref 22–32)
Calcium: 9.1 mg/dL (ref 8.9–10.3)
Chloride: 103 mmol/L (ref 98–111)
Creatinine, Ser: 1.58 mg/dL — ABNORMAL HIGH (ref 0.44–1.00)
GFR, Estimated: 35 mL/min — ABNORMAL LOW (ref >60)
Glucose, Bld: 123 mg/dL — ABNORMAL HIGH (ref 70–99)
Potassium: 3.5 mmol/L (ref 3.5–5.1)
Sodium: 139 mmol/L (ref 135–145)
Total Bilirubin: 0.5 mg/dL (ref 0.3–1.2)
Total Protein: 5.9 g/dL — ABNORMAL LOW (ref 6.5–8.1)

## 2020-09-11 LAB — GLUCOSE, CAPILLARY: Glucose-Capillary: 112 mg/dL — ABNORMAL HIGH (ref 70–99)

## 2020-09-11 LAB — PHOSPHORUS: Phosphorus: 2.9 mg/dL (ref 2.5–4.6)

## 2020-09-11 MED ORDER — PANCRELIPASE (LIP-PROT-AMYL) 12000-38000 UNITS PO CPEP
36000.0000 [IU] | ORAL_CAPSULE | Freq: Once | ORAL | Status: AC
  Administered 2020-09-11: 36000 [IU] via ORAL
  Filled 2020-09-11: qty 3

## 2020-09-11 MED ORDER — MAGNESIUM SULFATE 2 GM/50ML IV SOLN
2.0000 g | Freq: Once | INTRAVENOUS | Status: AC
  Administered 2020-09-11: 2 g via INTRAVENOUS
  Filled 2020-09-11: qty 50

## 2020-09-11 MED ORDER — POTASSIUM CHLORIDE 10 MEQ/100ML IV SOLN
10.0000 meq | INTRAVENOUS | Status: AC
  Administered 2020-09-11 (×4): 10 meq via INTRAVENOUS
  Filled 2020-09-11 (×4): qty 100

## 2020-09-11 MED ORDER — TRAVASOL 10 % IV SOLN
INTRAVENOUS | Status: AC
  Filled 2020-09-11: qty 756

## 2020-09-11 NOTE — Progress Notes (Signed)
PHARMACY - TOTAL PARENTERAL NUTRITION CONSULT NOTE   Indication: on TPN prior to admission with hx Crohn's disease with short gut syndrome  Patient Measurements: Height: 5' 8"  (172.7 cm) Weight: 63.5 kg (140 lb) IBW/kg (Calculated) : 63.9 TPN AdjBW (KG): 63.5 Body mass index is 21.29 kg/m.  Assessment: TPN was originally on hold due to possibility of PICC removal but ID has decided that will try and keep the line for now and treat bacteremia. Per Dr. Linus Salmons on 10/10, ok to resume TPN through current PICC line.  Glucose / Insulin:  SSI discontinued on 10/11 given stable CBGs. CBG from previous 24 hrs are WNL (goal 100-150) including a BG check while TPN off.  Electrolytes: all WNL; K and Mg at low end of goal  Renal: SCr remains elevated but improved overnight 1.58, BUN rising 56. No UOP recorded (accurate?) LFTs / TGs: AST WNL, ALT trending down; TG WNL. Lipids added back to TPN on 10/11 Prealbumin / albumin: 30.5/2.7 Intake / Output; MIVF: LR @ 100 mL/hr while TPN is off per MD -Unmeasured UOP x7 GI Imaging:  Surgeries / Procedures: n/a  Central access: PICC line TPN start date: on PTA  Nutritional Goals (per RD recommendation on 10/14): kCal: 1905-2100, Protein: 95-110, Fluid: >= 2L/day  Per home TPN formula, 1800 mL over 14 hours to provide 75g protein and 1081 kcal with no lipid and when lipid added to TPN, provides 75g protein and 1481 kcal  Current Nutrition:  CLD for comfort (held 14/23) + cyclic TPN. NGT placed 10/15 -Prosource 30 mL BID, [employer] 1.4 qday per RD  Note: TPN was not increased to full rate on 10/15 PM.  Plan: Mag sulfate 2g IV x 1 KCl 74mq IV x 4 runs  At 1800:  1800 mL of TPN over 14 hours to provide 75g protein and 1458 Kcal   Electrolytes in TPN: increase K+ and Mg++  Na+ 130 mEq/L  K+ 30 mEq/L  No Ca++  Mg++ 7.5 mEq/L  No Phos+  Cl:Ac ratio 1:2  MVI  And trace elements added to TPN  LR @ 100 mL/hr when TPN off per  MD  Monitor TPN labs on Mon/Thurs  EPeggyann Juba PharmD, BCPS Pharmacy: [phone] 101910/17/21 8:06 AM

## 2020-09-11 NOTE — Progress Notes (Signed)
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 She also has had multiple PICC line infections and has had to have several removed from throughout her body over the years and has multiple scars to show where previous lines have been.  She had her most recent PICC placed in the right femoral vein on 08/05/20. She is admitted to the hospital on 09/01/20 with another bacteremia (Stenotrophomonas) and was started on Bactrim, but developed AKI and was then changed to Levaquin.   Furthermore, on 09/07/20 she developed acute abdominal pain on top of her chronic pain she lives with. She was unable to tolerate pills nor oral contrast and due to resolving AKI did not receive IV contrast either; but a CT abd/pelvis was obtained which revealed concern for underlying SBO. Surgery was consulted for further evaluation and assistance given her complex underlying history.  After evaluation, the patient decided she did not want any further surgeries especially if not emergently needed.  It was decided to manage her supportively and if she developed any further nausea and/or vomiting, then to place NG tube. On the night of 09/08/2020, she became more nauseous and had severe vomiting.  NG tube was then placed.  She noticed improvement in her symptoms after this.  On 10/17 she was feeling better some with return of some flatus and bowel movements. Was still having large output from NGT ~850 cc but was interested in a trial off the NGT and resuming diet.    Interval History:  Still having large amount from NGT since yesterday but she is wanting to try being off the NGT and eating again. We will try this today and see how  she does.  She is having small BM and flatus.   Old records reviewed in assessment of this patient  ROS: Constitutional: positive for anorexia, fatigue, malaise and weight loss, Respiratory: negative for cough, Cardiovascular: negative for chest pain and Gastrointestinal: positive for abdominal pain and nausea  Assessment & Plan: * Bacteremia associated with intravascular line ([place]) - right femoral PICC placed 08/05/20; now with Stenotrophomonas bacteremia; ID following - did not tolerate Bactrim due to AKI, now on Levaquin - patient has had recurrent infections/bacteremias; we are attempting to treat with PICC in place; clinically she's at least tolerating and responding fairly well (e.g. no fevers, she's chronically neutropenic so difficult to even know if sign of sepsis for her, but her differential is relatively reassuring) - tentative plan is to leave PICC in place and continue Levaquin until 10/21 for 14 day course - blood culture from 10/7 is negative  - if fever repeat blood cultures (PICC and PIV) - outpatient ID f/u on 10/25  Abdominal pain - CT abdomen/pelvis performed on 09/07/2020 reveals possible obstruction.  Her underlying anatomy complicates the picture with interpretation.  Greatly appreciate surgery assistance in the management of her care.  Supportive and symptomatic management is ultimate goal.  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 Patient relocated from [place] to [place] to be closer to family - for now, continue TPN for short gut syndrome - long term prognosis is poor (multiple hospitalizations, infections, weight loss/malnutrition, chronic pain, poor quality of life)  On total parenteral nutrition (TPN) - R femoral PICC in place  - continue TPN  Acute renal failure superimposed on stage 3b chronic kidney disease ([name])-resolved as of 09/09/2020 - baseline probably close to 1.5 creatinine; mild increase from Bactrim which was changed to Levaquin for above infection - creatinine appears to have peaked at 2.02, now has trended back to baseline - continue IVF when TPN is off and trend BMP  Abnormal LFTs - likely related to TPN as well - intermittent CMP; stable LFTs  Short gut syndrome -See Crohn's disease  Sepsis ([name])-resolved as of 09/07/2020 - see bacteremia   Antimicrobials: Bactrim 09/01/2020>> 09/06/2020 Levaquin 09/06/2020>> present  DVT prophylaxis: SCD Code Status: Full Family Communication: none present Disposition Plan: Status is: Inpatient  Remains inpatient appropriate because:Ongoing active pain requiring inpatient pain management, Unsafe d/c plan, IV treatments appropriate due to intensity of illness or inability to take PO and Inpatient level of care appropriate due to severity of illness   Dispo: The patient is from: Home              Anticipated d/c is to: Home              Anticipated d/c date is: > 3 days              Patient currently is not medically stable to d/c.   Objective: Blood pressure (!) 129/58, pulse (!) 49, temperature 98.4 F (36.9 C), resp. rate 17, height 5' 8"  (1.727 m), weight 63.5 kg, SpO2 98 %.  Examination: General appearance: more comfortable appearing woman laying in bed in no distress but still in pain Head: Normocephalic, without obvious abnormality, atraumatic Eyes: EOMI Lungs: clear to  auscultation bilaterally Heart: regular rate and rhythm and S1, S2 normal Abdomen: Large midline abdominal scar noted from multiple prior surgeries.  BS more active today in all 4 quadrants but still TTP throughout which is almost close to baseline she says Extremities: Thin, no edema Skin: dry, scaly Neurologic: Grossly normal  Consultants:   ID  Surgery  Procedures:   n/a  Data Reviewed: I have personally reviewed following labs and imaging studies Results for orders placed or performed during the hospital encounter of 08/31/20 (from the past 24 hour(s))  Glucose, capillary     Status: Abnormal   Collection Time: 09/10/20 12:00 PM  Result Value Ref Range   Glucose-Capillary 117 (H) 70 - 99 mg/dL  Comprehensive metabolic panel     Status: Abnormal   Collection Time: 09/11/20  4:19 AM  Result Value Ref Range   Sodium 139 135 - 145 mmol/L   Potassium 3.5 3.5 - 5.1 mmol/L   Chloride 103 98 - 111 mmol/L   CO2 29 22 - 32 mmol/L   Glucose, Bld 123 (H) 70 - 99 mg/dL   BUN 56 (H) 6 - 20 mg/dL   Creatinine, Ser 1.58 (H) 0.44 - 1.00 mg/dL  Calcium 9.1 8.9 - 10.3 mg/dL   Total Protein 5.9 (L) 6.5 - 8.1 g/dL   Albumin 2.7 (L) 3.5 - 5.0 g/dL   AST 25 15 - 41 U/L   ALT 47 (H) 0 - 44 U/L   Alkaline Phosphatase 144 (H) 38 - 126 U/L   Total Bilirubin 0.5 0.3 - 1.2 mg/dL   GFR, Estimated 35 (L) >60 mL/min   Anion gap 7 5 - 15  Magnesium     Status: None   Collection Time: 09/11/20  4:19 AM  Result Value Ref Range   Magnesium 1.8 1.7 - 2.4 mg/dL  Phosphorus     Status: None   Collection Time: 09/11/20  4:19 AM  Result Value Ref Range   Phosphorus 2.9 2.5 - 4.6 mg/dL  Glucose, capillary     Status: Abnormal   Collection Time: 09/11/20  7:22 AM  Result Value Ref Range   Glucose-Capillary 112 (H) 70 - 99 mg/dL    No results found for this or any previous visit (from the past 240 hour(s)).   Radiology Studies: DG Abd Portable 1V-Small Bowel Obstruction Protocol-24 hr  delay  Result Date: 09/10/2020 CLINICAL DATA:  Small-bowel obstruction. EXAM: PORTABLE ABDOMEN - 1 VIEW COMPARISON:  09/09/2020 FINDINGS: Contrast is noted to the level of the rectum. There is persistent gaseous distention of the colon and loops of small bowel. The enteric tube projects over the gastric body. The previously demonstrated tunneled femoral approach central venous catheter is essentially stable. IMPRESSION: Contrast is noted to the level of the rectum. There is persistent gaseous distention of the small bowel and colon. Electronically Signed   By: Constance Holster M.D.   On: 09/10/2020 15:24   DG Abd Portable 1V-Small Bowel Obstruction Protocol-initial, 8 hr delay  Result Date: 09/09/2020 CLINICAL DATA:  8 hour delay protocol. EXAM: PORTABLE ABDOMEN - 1 VIEW COMPARISON:  X-ray dated 09/09/2020 FINDINGS: The NG tube projects over the stomach. Oral contrast is noted within the small bowel and colon. Again noted is a femoral approach tunneled venous catheter. There is no pneumatosis or free air. IMPRESSION: Oral contrast is noted within the small bowel and colon. Electronically Signed   By: Constance Holster M.D.   On: 09/09/2020 21:21   DG Abd Portable 1V-Small Bowel Obstruction Protocol-24 hr delay  Final Result    DG Abd Portable 1V-Small Bowel Obstruction Protocol-initial, 8 hr delay  Final Result    DG Abd Portable 1V  Final Result    CT ABDOMEN PELVIS WO CONTRAST  Final Result      Scheduled Meds: . (feeding supplement) PROSource Plus  30 mL Oral BID BM  . buPROPion  200 mg Oral Daily  . Chlorhexidine Gluconate Cloth  6 each Topical Daily  . cycloSPORINE  1 drop Both Eyes BID  . dicyclomine  20 mg Oral TID  . DULoxetine  90 mg Oral Daily  . estradiol  2 mg Oral Daily  . feeding supplement ([employer] STANDARD 1.4)  325 mL Oral Daily  . hydrALAZINE  50 mg Oral TID  . metoprolol tartrate  100 mg Oral BID  . pantoprazole (PROTONIX) IV  40 mg Intravenous Daily  .  polyvinyl alcohol  1 drop Both Eyes QID  . sodium chloride flush  10-40 mL Intracatheter Q12H   PRN Meds: hydrALAZINE, HYDROmorphone (DILAUDID) injection, labetalol, ondansetron (ZOFRAN) IV, promethazine, sodium chloride flush, traZODone Continuous Infusions: . lactated ringers 100 mL/hr at 09/11/20 0847  . levofloxacin (LEVAQUIN) IV Stopped (  09/10/20 1845)  . magnesium sulfate bolus IVPB 2 g (09/11/20 0847)  . potassium chloride    . TPN CYCLIC-ADULT (ION)        LOS: 10 days  Time spent: Greater than 50% of the 35 minute visit was spent in counseling/coordination of care for the patient as laid out in the A&P.   Dwyane Dee, MD [facility] 09/11/2020, 9:17 AM

## 2020-09-11 NOTE — Progress Notes (Signed)
Subjective/Chief Complaint: Had several BM's Wants to try NG out   Objective: Vital signs in last 24 hours: Temp:  [98.3 F (36.8 C)-98.5 F (36.9 C)] 98.4 F (36.9 C) (10/17 0522) Pulse Rate:  [49-55] 49 (10/17 0522) Resp:  [17-18] 17 (10/17 0522) BP: (125-134)/(58-69) 129/58 (10/17 0522) SpO2:  [98 %] 98 % (10/17 0522) Last BM Date: 09/09/20  Intake/Output from previous day: 10/16 0701 - 10/17 0700 In: 4082.1 [P.O.:720; I.V.:1566.4; IV Piggyback:1795.7] Out: 850 [Emesis/NG output:850] Intake/Output this shift: No intake/output data recorded.  Exam: Awake and alert Comfortable Abdomen soft, minimally tender  Lab Results:  No results for input(s): WBC, HGB, HCT, PLT in the last 72 hours. BMET Recent Labs    09/10/20 0520 09/11/20 0419  NA 137 139  K 3.8 3.5  CL 107 103  CO2 24 29  GLUCOSE 119* 123*  BUN 54* 56*  CREATININE 1.80* 1.58*  CALCIUM 9.4 9.1   PT/INR No results for input(s): LABPROT, INR in the last 72 hours. ABG No results for input(s): PHART, HCO3 in the last 72 hours.  Invalid input(s): PCO2, PO2  Studies/Results: DG Abd Portable 1V-Small Bowel Obstruction Protocol-24 hr delay  Result Date: 09/10/2020 CLINICAL DATA:  Small-bowel obstruction. EXAM: PORTABLE ABDOMEN - 1 VIEW COMPARISON:  09/09/2020 FINDINGS: Contrast is noted to the level of the rectum. There is persistent gaseous distention of the colon and loops of small bowel. The enteric tube projects over the gastric body. The previously demonstrated tunneled femoral approach central venous catheter is essentially stable. IMPRESSION: Contrast is noted to the level of the rectum. There is persistent gaseous distention of the small bowel and colon. Electronically Signed   By: Constance Holster M.D.   On: 09/10/2020 15:24   DG Abd Portable 1V-Small Bowel Obstruction Protocol-initial, 8 hr delay  Result Date: 09/09/2020 CLINICAL DATA:  8 hour delay protocol. EXAM: PORTABLE ABDOMEN - 1  VIEW COMPARISON:  X-ray dated 09/09/2020 FINDINGS: The NG tube projects over the stomach. Oral contrast is noted within the small bowel and colon. Again noted is a femoral approach tunneled venous catheter. There is no pneumatosis or free air. IMPRESSION: Oral contrast is noted within the small bowel and colon. Electronically Signed   By: Constance Holster M.D.   On: 09/09/2020 21:21    Anti-infectives: Anti-infectives (From admission, onward)   Start     Dose/Rate Route Frequency Ordered Stop   09/08/20 1400  levofloxacin (LEVAQUIN) IVPB 750 mg        750 mg 100 mL/hr over 90 Minutes Intravenous Every 48 hours 09/07/20 1550 09/16/20 1359   09/06/20 1400  levofloxacin (LEVAQUIN) tablet 750 mg  Status:  Discontinued        750 mg Oral Every 48 hours 09/06/20 0912 09/07/20 1550   09/04/20 1400  sulfamethoxazole-trimethoprim (BACTRIM DS) 800-160 MG per tablet 2 tablet  Status:  Discontinued        2 tablet Oral Every 8 hours 09/04/20 1006 09/06/20 0734   09/04/20 1000  sulfamethoxazole-trimethoprim (BACTRIM DS) 800-160 MG per tablet 1 tablet  Status:  Discontinued        1 tablet Oral Every 12 hours 09/04/20 0945 09/04/20 1004   09/01/20 1600  sulfamethoxazole-trimethoprim (BACTRIM) 320 mg in dextrose 5 % 500 mL IVPB  Status:  Discontinued        320 mg 346.7 mL/hr over 90 Minutes Intravenous Every 8 hours 09/01/20 0819 09/04/20 0945   09/01/20 0000  sulfamethoxazole-trimethoprim (BACTRIM) 317.44 mg in dextrose 5 %  500 mL IVPB  Status:  Discontinued        15 mg/kg/day  63.5 kg 346.6 mL/hr over 90 Minutes Intravenous Every 8 hours 08/31/20 2356 09/01/20 0819      Assessment/Plan: CKDstage III Bacteremia - stenotrophomonas maltophilia 10/6 Crohn's Disease- diagnosed 1980's Short gut syndrome - last abdominal surgery 2018 at [facility] [facility], per chart review has 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 remaining with a jejunal to transverse colon anastomosis. TPN dependent. Per patient she was not a candidate  for bowel transplant while in [place] but I do think it may be worth while for her to get another opinion from a tertiary/academic center with small bowel transplant service in the future, given she has more family support here. At this time she is not open to abdominal surgery so we would need to discuss this with the patient in more detail.  Possible pSBO Will remove NG per patient request and try a regular diet per her request Again, no plans for surgery   LOS: 10 days    Caitlin Keens MD 09/11/2020

## 2020-09-12 ENCOUNTER — Other Ambulatory Visit: Payer: Self-pay | Admitting: Internal Medicine

## 2020-09-12 DIAGNOSIS — R7881 Bacteremia: Secondary | ICD-10-CM | POA: Diagnosis not present

## 2020-09-12 DIAGNOSIS — R109 Unspecified abdominal pain: Secondary | ICD-10-CM | POA: Diagnosis not present

## 2020-09-12 DIAGNOSIS — K50119 Crohn's disease of large intestine with unspecified complications: Secondary | ICD-10-CM

## 2020-09-12 DIAGNOSIS — T827XXD Infection and inflammatory reaction due to other cardiac and vascular devices, implants and grafts, subsequent encounter: Secondary | ICD-10-CM | POA: Diagnosis not present

## 2020-09-12 DIAGNOSIS — K912 Postsurgical malabsorption, not elsewhere classified: Secondary | ICD-10-CM | POA: Diagnosis not present

## 2020-09-12 LAB — CBC
HCT: 24.5 % — ABNORMAL LOW (ref 36.0–46.0)
Hemoglobin: 8.1 g/dL — ABNORMAL LOW (ref 12.0–15.0)
MCH: 30.8 pg (ref 26.0–34.0)
MCHC: 33.1 g/dL (ref 30.0–36.0)
MCV: 93.2 fL (ref 80.0–100.0)
Platelets: 179 10*3/uL (ref 150–400)
RBC: 2.63 MIL/uL — ABNORMAL LOW (ref 3.87–5.11)
RDW: 13.8 % (ref 11.5–15.5)
WBC: 5.7 10*3/uL (ref 4.0–10.5)
nRBC: 0 % (ref 0.0–0.2)

## 2020-09-12 LAB — DIFFERENTIAL
Abs Immature Granulocytes: 0.01 10*3/uL (ref 0.00–0.07)
Basophils Absolute: 0 10*3/uL (ref 0.0–0.1)
Basophils Relative: 0 %
Eosinophils Absolute: 0.2 10*3/uL (ref 0.0–0.5)
Eosinophils Relative: 3 %
Immature Granulocytes: 0 %
Lymphocytes Relative: 55 %
Lymphs Abs: 3.1 10*3/uL (ref 0.7–4.0)
Monocytes Absolute: 0.3 10*3/uL (ref 0.1–1.0)
Monocytes Relative: 5 %
Neutro Abs: 2.1 10*3/uL (ref 1.7–7.7)
Neutrophils Relative %: 37 %

## 2020-09-12 LAB — COMPREHENSIVE METABOLIC PANEL
ALT: 51 U/L — ABNORMAL HIGH (ref 0–44)
AST: 29 U/L (ref 15–41)
Albumin: 3.1 g/dL — ABNORMAL LOW (ref 3.5–5.0)
Alkaline Phosphatase: 173 U/L — ABNORMAL HIGH (ref 38–126)
Anion gap: 7 (ref 5–15)
BUN: 56 mg/dL — ABNORMAL HIGH (ref 6–20)
CO2: 28 mmol/L (ref 22–32)
Calcium: 9.2 mg/dL (ref 8.9–10.3)
Chloride: 104 mmol/L (ref 98–111)
Creatinine, Ser: 1.81 mg/dL — ABNORMAL HIGH (ref 0.44–1.00)
GFR, Estimated: 30 mL/min — ABNORMAL LOW (ref >60)
Glucose, Bld: 111 mg/dL — ABNORMAL HIGH (ref 70–99)
Potassium: 3.6 mmol/L (ref 3.5–5.1)
Sodium: 139 mmol/L (ref 135–145)
Total Bilirubin: 0.5 mg/dL (ref 0.3–1.2)
Total Protein: 6.5 g/dL (ref 6.5–8.1)

## 2020-09-12 LAB — PHOSPHORUS: Phosphorus: 2.6 mg/dL (ref 2.5–4.6)

## 2020-09-12 LAB — GLUCOSE, CAPILLARY
Glucose-Capillary: 141 mg/dL — ABNORMAL HIGH (ref 70–99)
Glucose-Capillary: 72 mg/dL (ref 70–99)
Glucose-Capillary: 87 mg/dL (ref 70–99)

## 2020-09-12 LAB — TRIGLYCERIDES: Triglycerides: 55 mg/dL (ref <150)

## 2020-09-12 LAB — PREALBUMIN: Prealbumin: 29.8 mg/dL (ref 18–38)

## 2020-09-12 LAB — MAGNESIUM: Magnesium: 2.5 mg/dL — ABNORMAL HIGH (ref 1.7–2.4)

## 2020-09-12 MED ORDER — LEVOFLOXACIN 750 MG PO TABS: 750.0000 mg | ORAL_TABLET | Freq: Once | ORAL | 0 refills | Status: AC

## 2020-09-12 MED ORDER — INSULIN ASPART 100 UNIT/ML ~~LOC~~ SOLN
0.0000 [IU] | SUBCUTANEOUS | Status: DC
  Administered 2020-09-12: 1 [IU] via SUBCUTANEOUS

## 2020-09-12 MED ORDER — PANCRELIPASE (LIP-PROT-AMYL) 12000-38000 UNITS PO CPEP: 36000.0000 [IU] | ORAL_CAPSULE | Freq: Three times a day (TID) | ORAL | Status: DC | PRN

## 2020-09-12 MED ORDER — HEPARIN SOD (PORK) LOCK FLUSH 100 UNIT/ML IV SOLN
250.0000 [IU] | INTRAVENOUS | Status: AC | PRN
  Administered 2020-09-12: 250 [IU]
  Filled 2020-09-12: qty 2.5

## 2020-09-12 MED ORDER — LEVOFLOXACIN 500 MG PO TABS: 750.0000 mg | ORAL_TABLET | ORAL | Status: DC

## 2020-09-12 MED ORDER — TRAVASOL 10 % IV SOLN
INTRAVENOUS | Status: DC
  Filled 2020-09-12: qty 756

## 2020-09-12 NOTE — Progress Notes (Signed)
[facility] Progress Note     Subjective: CC-  Sitting up in bed. Daughter present via speakerphone. States that she is feeling much better. Abdominal pain is back to baseline. Tolerating diet and had several bowel movements over night. States that bowel function is also back to her baseline.  Objective: Vital signs in last 24 hours: Temp:  [98.6 F (37 C)-99 F (37.2 C)] 99 F (37.2 C) (10/18 0903) Pulse Rate:  [41-81] 81 (10/18 0903) Resp:  [12-18] 12 (10/18 0903) BP: (111-146)/(49-82) 111/49 (10/18 0903) SpO2:  [98 %-100 %] 98 % (10/18 0903) Last BM Date: 09/11/20  Intake/Output from previous day: 10/17 0701 - 10/18 0700 In: 2575.6 [P.O.:480; I.V.:1518; IV Piggyback:577.7] Out: -  Intake/Output this shift: Total I/O In: 240 [P.O.:240] Out: -   PE: Gen:  Alert, NAD, pleasant Pulm:  rate and effort normal Abd: Soft, ND, mild diffuse tenderness, +BS  Lab Results:  Recent Labs    09/12/20 0339  WBC 5.7  HGB 8.1*  HCT 24.5*  PLT 179   BMET Recent Labs    09/11/20 0419 09/12/20 0339  NA 139 139  K 3.5 3.6  CL 103 104  CO2 29 28  GLUCOSE 123* 111*  BUN 56* 56*  CREATININE 1.58* 1.81*  CALCIUM 9.1 9.2   PT/INR No results for input(s): LABPROT, INR in the last 72 hours. CMP     Component Value Date/Time   NA 139 09/12/2020 0339   K 3.6 09/12/2020 0339   CL 104 09/12/2020 0339   CO2 28 09/12/2020 0339   GLUCOSE 111 (H) 09/12/2020 0339   BUN 56 (H) 09/12/2020 0339   CREATININE 1.81 (H) 09/12/2020 0339   CALCIUM 9.2 09/12/2020 0339   PROT 6.5 09/12/2020 0339   ALBUMIN 3.1 (L) 09/12/2020 0339   AST 29 09/12/2020 0339   ALT 51 (H) 09/12/2020 0339   ALKPHOS 173 (H) 09/12/2020 0339   BILITOT 0.5 09/12/2020 0339   GFRNONAA 30 (L) 09/12/2020 0339   GFRAA 55 (L) 08/12/2020 0513   Lipase     Component Value Date/Time   LIPASE 23 09/07/2020 1136       Studies/Results: DG Abd Portable 1V-Small Bowel Obstruction Protocol-24 hr  delay  Result Date: 09/10/2020 CLINICAL DATA:  Small-bowel obstruction. EXAM: PORTABLE ABDOMEN - 1 VIEW COMPARISON:  09/09/2020 FINDINGS: Contrast is noted to the level of the rectum. There is persistent gaseous distention of the colon and loops of small bowel. The enteric tube projects over the gastric body. The previously demonstrated tunneled femoral approach central venous catheter is essentially stable. IMPRESSION: Contrast is noted to the level of the rectum. There is persistent gaseous distention of the small bowel and colon. Electronically Signed   By: Constance Holster M.D.   On: 09/10/2020 15:24    Anti-infectives: Anti-infectives (From admission, onward)   Start     Dose/Rate Route Frequency Ordered Stop   09/08/20 1400  levofloxacin (LEVAQUIN) IVPB 750 mg        750 mg 100 mL/hr over 90 Minutes Intravenous Every 48 hours 09/07/20 1550 09/16/20 1359   09/06/20 1400  levofloxacin (LEVAQUIN) tablet 750 mg  Status:  Discontinued        750 mg Oral Every 48 hours 09/06/20 0912 09/07/20 1550   09/04/20 1400  sulfamethoxazole-trimethoprim (BACTRIM DS) 800-160 MG per tablet 2 tablet  Status:  Discontinued        2 tablet Oral Every 8 hours 09/04/20 1006 09/06/20 0734   09/04/20 1000  sulfamethoxazole-trimethoprim (BACTRIM DS) 800-160 MG per tablet 1 tablet  Status:  Discontinued        1 tablet Oral Every 12 hours 09/04/20 0945 09/04/20 1004   09/01/20 1600  sulfamethoxazole-trimethoprim (BACTRIM) 320 mg in dextrose 5 % 500 mL IVPB  Status:  Discontinued        320 mg 346.7 mL/hr over 90 Minutes Intravenous Every 8 hours 09/01/20 0819 09/04/20 0945   09/01/20 0000  sulfamethoxazole-trimethoprim (BACTRIM) 317.44 mg in dextrose 5 % 500 mL IVPB  Status:  Discontinued        15 mg/kg/day  63.5 kg 346.6 mL/hr over 90 Minutes Intravenous Every 8 hours 08/31/20 2356 09/01/20 0819       Assessment/Plan CKDstage III Bacteremia - stenotrophomonas maltophilia 10/6. abx per ID Crohn's  Disease- diagnosed 1980's Short gut syndrome - last abdominal surgery 2018 at [place] [facility], per chart review has 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 remaining with a jejunal to transverse colon anastomosis. TPN dependent. Per patient she was not a candidate for bowel transplant while in [place] but I do think it may be worth while for her to get another opinion from a tertiary/academic center with small bowel transplant service in the future if she is agreeable, given she has more family support here. At this time she does not want to consider any additional surgical procedures  Possible pSBO - passed small bowel protocol with contrast in rectum on film 10/16 - tolerating diet and having bowel function  ID - currently levaquin 10/12>> FEN - TPN, regular diet VTE - SCDs, per primary, ok for chemical DVT prophylaxis from surgical standpoint Foley - none  Plan: Patient is ok for discharge from surgical standpoint. She is tolerating a regular diet and having bowel function. She does not need any acute abdominal surgery, but as above could benefit from appointment in the future with surgical services at local tertiary care facility to discuss possibility of bowel transplant. At this time she is not interested in pursuing this.  We will sign off, please call with concerns.    LOS: 11 days    [facility] 09/12/2020, 10:50 AM Please see Amion for pager number during day hours 7:00am-4:30pm

## 2020-09-12 NOTE — Discharge Instructions (Signed)
You Levaquin dosing is every other day. Therefore your next and last dose of Levaquin is on 09/14/20. Take this dose in the morning and that will complete your course of antibiotics.  Follow up with infectious disease on 09/19/20.

## 2020-09-12 NOTE — Plan of Care (Signed)
Instructions were reviewed with patient. All questions were answered. Patient left with femoral PICC that will remain chronic for home infusions. Dressing C,D,I and biopatch in place. Patient was transported to main entrance by wheelchair.

## 2020-09-12 NOTE — Progress Notes (Signed)
At bedside to cap patient central line for home.  Patient states that she will require IV medications prior to D/C.  Bedside RN updated that running infusion not stopped for this reason and need for IV team consult to be re entered for D/C home.

## 2020-09-12 NOTE — Progress Notes (Addendum)
PHARMACY - TOTAL PARENTERAL NUTRITION CONSULT NOTE   Indication: on TPN prior to admission with hx Crohn's disease with short gut syndrome  Patient Measurements: Height: 5' 8"  (172.7 cm) Weight: 63.5 kg (140 lb) IBW/kg (Calculated) : 63.9 TPN AdjBW (KG): 63.5 Body mass index is 21.29 kg/m.  Assessment: TPN was originally on hold due to possibility of PICC removal but ID has decided that will try and keep the line for now and treat bacteremia. Per Dr. Linus Salmons on 10/10, ok to resume TPN through current PICC line.  Glucose / Insulin:  SSI discontinued on 10/11 given stable CBGs. CBG from previous 24 hrs are WNL (goal 100-150) including a BG check while TPN off. AM CBG 141 at 0746 > TPN still infusing  Electrolytes: all WNL; K and Mg at low end of goal  Renal: SCr remains elevated but improved overnight 1.58, BUN rising 56. No UOP recorded (accurate?) LFTs / TGs: AST WNL, ALT trending down; TG WNL. Lipids added back to TPN on 10/11 Prealbumin / albumin: 30.5/2.7, 29.8/3.1 (10/18) Intake / Output; MIVF: LR @ 100 mL/hr while TPN is off per MD -Unmeasured UOP GI Imaging:  Surgeries / Procedures: n/a  Central access: PICC line TPN start date: on PTA  Nutritional Goals (per RD recommendation on 10/14): kCal: 1905-2100, Protein: 95-110, Fluid: >= 2L/day  Per home TPN formula, 1800 mL over 14 hours to provide 75g protein and 1081 kcal with no lipid and when lipid added to TPN, provides 75g protein and 1481 kcal  Current Nutrition:  Back to regular diet 10/17 (was held 93/73) + cyclic TPN. NGT placed 10/15 -Prosource 30 mL BID, [employer] 1.4 qday per RD  Note: TPN was not increased to full rate on 10/15 PM.  Plan:   At 1800:  1800 mL of TPN over 14 hours to provide 75g protein and 1458 Kcal   Electrolytes in TPN: increase K+ and Mg++  Na+ 130 mEq/L  K+ increase to 35 mEq/L  No Ca++  Mg++ decrease to 6 mEq/L  No Phos+  Cl:Ac ratio 1:2  MVI  And trace elements added to  TPN  LR @ 100 mL/hr when TPN off per MD  Monitor TPN labs on Mon/Thurs  BMET, Mag & Phos levels in am 10/19  [place], [place] [phone] 09/12/2020, 8:40 AM

## 2020-09-12 NOTE — Discharge Summary (Signed)
Physician Discharge Summary   Caitlin Peters DOB: 05-11-1961 DOA: 08/31/2020  PCP: Caitlin Peters, Caitlin Halsted, MD  Admit date: 08/31/2020 Discharge date: 09/12/2020  Admitted From: Home Disposition: Home Discharging physician: Caitlin Dee, MD  Recommendations for Outpatient Follow-up:  1. Continue chronic TPN 2. Follow-up with infectious disease on 09/19/2020 3. Consider referral to tertiary center for possible second opinion on SB transplant   Patient discharged to home in Discharge Condition: stable CODE STATUS: Full Diet recommendation:  Diet Orders (From admission, onward)    Start     Ordered   09/12/20 0000  Diet general        09/12/20 1202   09/11/20 0818  Diet regular Room service appropriate? Yes; Fluid consistency: Thin  Diet effective now       Question Answer Comment  Room service appropriate? 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 She also has had multiple PICC line infections and has had to have several removed from throughout her body over the years.  She had her most recent PICC placed in the right femoral vein on 08/05/20. She is admitted to the hospital on 09/01/20 with another bacteremia (Stenotrophomonas) and was started on Bactrim, but developed AoCKDIII and was then changed to Levaquin.  Renal function did recover and downtrend near baseline prior to discharge.   Furthermore, on 09/07/20 she developed acute abdominal pain on top of her chronic pain. She was unable to tolerate pills nor oral contrast and due to resolving AKI and did not receive IV contrast either; but a CT abd/pelvis was obtained which revealed concern for underlying pSBO. Surgery was consulted for further evaluation and assistance given her complex  underlying history.  After evaluation, the patient had decided she did not want any further surgeries especially if not emergently needed.  It was decided to manage her supportively and if she developed any further nausea and/or vomiting, then to place NG tube. On the night of 09/08/2020, she became more nauseous and had severe vomiting.  NG tube was then placed.  She noticed improvement in her symptoms after this.  On 10/17 she was feeling better some with return of some flatus and bowel movements. Was still having large output from NGT ~850 cc but was interested in a trial off the NGT and resuming diet.  She tolerated trial off of NG tube well and advancement of her diet.  Pain returned to its normal chronic state and she had no further nausea/vomiting.  For her bacteremia, her course of Levaquin will be completed on 09/14/2020 (renal dosing of q48h).  She will follow up with infectious disease on 09/19/2020 and have repeat blood cultures drawn at that time.  She only required 1 more pill at discharge to be taken on 09/14/2020.  She was given oral dose of Levaquin on 09/12/2020 prior to discharge.   * Bacteremia associated with intravascular line (Caitlin Peters) - right femoral PICC placed 08/05/20; now with Stenotrophomonas bacteremia; ID following - did not tolerate Bactrim due to AKI, now on Levaquin - patient has had recurrent infections/bacteremias; we are attempting to treat with PICC in place; clinically she's at least tolerating and responding fairly well (e.g. no fevers, she's chronically neutropenic so difficult to  even know if sign of sepsis for her, but her differential is relatively reassuring) - tentative plan is to leave PICC in place and continue Levaquin until 10/20 for 14 day course - blood culture from 10/7 is negative  - if fever repeat blood cultures (PICC and PIV) - outpatient ID f/u on 10/25; repeat BC x 2 per ID rec's  Abdominal pain - CT abdomen/pelvis performed on 09/07/2020  reveals possible obstruction.  Her underlying anatomy complicates the picture with interpretation.  Greatly appreciate surgery assistance in the management of her care.  Supportive and symptomatic management is ultimate goal.  Patient declining surgery unless absolutely necessary -NG tube required evening of 09/08/2020; she tolerated NG tube removal on 09/11/2020. 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 Patient relocated from [place] to [place] to be closer to family - for now, continue TPN for short gut syndrome - long term prognosis is poor (multiple hospitalizations, infections, weight loss/malnutrition, chronic pain, poor quality of life)  On total parenteral nutrition (TPN) - R femoral PICC in place  - continue TPN  Acute renal failure superimposed on stage 3b chronic kidney disease ([name])-resolved as of 09/09/2020 - baseline probably close to 1.5-1.7 creatinine; mild increase from Bactrim which was changed to Levaquin for above infection - creatinine appears to have peaked at 2.02, now has trended back to baseline - continue IVF when TPN is off and trend BMP  Abnormal LFTs - likely related to TPN as well - intermittent CMP; stable LFTs  Short gut syndrome -See Crohn's disease  Sepsis ([name])-resolved as of 09/07/2020 - see bacteremia    [name] chronic medical conditions were treated accordingly per the patient's home medication regimen except as noted.  On day of discharge, patient was felt deemed stable for discharge.  Patient/family member advised to call PCP or come back to ER if needed.   Principal Diagnosis: Bacteremia associated with intravascular line [facility])  Discharge Diagnoses: Active Hospital Problems   Diagnosis Date Noted  . Bacteremia associated with intravascular line ([place]) 08/04/2020    Priority: High  . Abdominal pain 09/07/2020    Priority: High  . Crohn disease ([place])     Priority: Medium  . On total parenteral nutrition (TPN) 01/02/2020    Priority: Low  . Abnormal LFTs 03/05/2020  . Short gut syndrome   . Crohn's colitis ([place]) 10/12/2019    Resolved Hospital Problems   Diagnosis Date Noted Date Resolved  . Acute renal failure superimposed on stage 3b chronic kidney disease ([place]) 03/05/2020 09/09/2020    Priority: Low  . Sepsis ([place]) 08/02/2020 09/07/2020    Discharge Instructions    Diet general   Complete by: As directed    Increase activity slowly   Complete by: As directed      Allergies as of 09/12/2020      Reactions   Cefepime Other (See Comments)   Neurotoxicity occurring in setting of AKI. Ceftriaxone tolerated during same admit   Gabapentin Other (See Comments)   unknown   Hyoscyamine Hives, Swelling   Legs swelling Disorientation  Lyrica [pregabalin] Other (See Comments)   unknown   Meperidine Hives   Other reaction(s): GI Upset Due to Chrones   Topamax [topiramate] Other (See Comments)   unknown   Zosyn [piperacillin Sod-tazobactam So]    Patient reports it makes her vomit, her neck stiff, and her "heart feel funny"   Fentanyl Rash   Pt is allergic to fentanyl patch related to the glue (gives her a rash) Pt states she is NOT allergic to fentanyl IV medicine   Morphine And Related Rash      Medication List    TAKE these medications   acetaminophen 325 MG tablet Commonly known as: TYLENOL Take 650 mg by mouth every 6 (six) hours as needed for mild pain.   amLODipine 10 MG tablet Commonly known as: NORVASC Take 1 tablet (10 mg total) by  mouth daily.   budesonide 3 MG 24 hr capsule Commonly known as: ENTOCORT EC TAKE 3 CAPSULES BY MOUTH ONCE DAILY What changed: how much to take   buPROPion 100 MG 12 hr tablet Commonly known as: WELLBUTRIN SR Take 2 tablets by mouth once daily   calcium carbonate 500 MG chewable tablet Commonly known as: TUMS - dosed in mg elemental calcium Chew 1 tablet by mouth 3 (three) times daily as needed for indigestion or heartburn.   cholecalciferol 25 MCG (1000 UNIT) tablet Commonly known as: VITAMIN D3 Take 1,000 Units by mouth daily.   cycloSPORINE 0.05 % ophthalmic emulsion Commonly known as: RESTASIS Place 1 drop into both eyes 2 (two) times daily.   Dexilant 60 MG capsule Generic drug: dexlansoprazole Take 1 capsule (60 mg total) by mouth daily.   Dextran 70-Hypromellose 0.1-0.3 % Soln Place 1 drop into both eyes 4 (four) times daily.   dicyclomine 20 MG tablet Commonly known as: BENTYL Take 20 mg by mouth 3 (three) times daily.   diphenoxylate-atropine 2.5-0.025 MG tablet Commonly known as: LOMOTIL Take 1 tablet by mouth 4 (four) times daily as needed for diarrhea or loose stools.   DULoxetine 30 MG capsule Commonly known as: CYMBALTA Take 3 capsules (90 mg total) by mouth daily.   estradiol 2 MG tablet Commonly known as: ESTRACE Take 1 tablet (2 mg total) by mouth daily.   famotidine 20 MG tablet Commonly known as: PEPCID Take 20 mg by mouth at bedtime.   hydrALAZINE 50 MG tablet Commonly known as: APRESOLINE Take 1 tablet (50 mg total) by mouth 3 (three) times daily.   levofloxacin 750 MG tablet Commonly known as: LEVAQUIN Take 1 tablet (750 mg total) by mouth once for 1 dose. Start taking on: September 14, 2020   lipase/protease/amylase 36000 UNITS Cpep capsule Commonly known as: Creon Take 1 capsule (36,000 Units total) by mouth 3 (three) times daily as needed (with meals for digestion).   loperamide 2 MG capsule Commonly known as: IMODIUM TAKE 1  CAPSULE BY MOUTH AS NEEDED FOR DIARRHEA OR  LOOSE  STOOLS What changed:   how much to take  how to take this  when to take this  reasons to take this  additional instructions   methadone 5 MG tablet Commonly known as: DOLOPHINE Take 1 tablet (5 mg total) by mouth 5 (five) times daily.   metoprolol tartrate 100 MG tablet Commonly known as: LOPRESSOR Take 1 tablet (100 mg total) by mouth 2 (two) times daily.   MULTIVITAMIN ADULT PO Take 1 tablet by mouth daily.   Omega-3 1000 MG Caps Take 1,000 mg by mouth daily.  PRESCRIPTION MEDICATION Inject 1 each into the vein daily. Home TPN . [facility] . 1 bag for 16 hours. [phone]   PROBIOTIC-10 PO Take 1 capsule by mouth daily.   promethazine 25 MG tablet Commonly known as: PHENERGAN TAKE ONE TABLET BY MOUTH EVERY 6 HOURS AS NEEDED FOR NAUSEA AND VOMITING What changed:   how much to take  how to take this  when to take this  reasons to take this  additional instructions   senna-docusate 8.6-50 MG tablet Commonly known as: Senokot-S Take 1 tablet by mouth 2 (two) times daily.   sucralfate 1 GM/10ML suspension Commonly known as: Carafate Take 10 mLs (1 g total) by mouth 4 (four) times daily -  with meals and at bedtime.   traZODone 100 MG tablet Commonly known as: DESYREL Take 2 tablets (200 mg total) by mouth at bedtime as needed for sleep.   vitamin B-12 100 MCG tablet Commonly known as: CYANOCOBALAMIN Take 1,000 mcg by mouth daily.       Follow-up Information    Tommy Medal, Lavell Islam, MD. Go on 09/19/2020.   Specialty: Infectious Diseases Why: 2:45 pm Contact information: [address] [address] [phone]              Allergies  Allergen Reactions  . Cefepime Other (See Comments)    Neurotoxicity occurring in setting of AKI. Ceftriaxone tolerated during same admit  . Gabapentin Other (See Comments)    unknown  . Hyoscyamine Hives  and Swelling    Legs swelling   Disorientation  . Lyrica [Pregabalin] Other (See Comments)    unknown  . Meperidine Hives    Other reaction(s): GI Upset Due to Chrones   . Topamax [Topiramate] Other (See Comments)    unknown  . Zosyn [Piperacillin Sod-Tazobactam So]     Patient reports it makes her vomit, her neck stiff, and her "heart feel funny"  . Fentanyl Rash    Pt is allergic to fentanyl patch related to the glue (gives her a rash) Pt states she is NOT allergic to fentanyl IV medicine  . Morphine And Related Rash    Consultations: Surgery Infectious disease  Discharge Exam: BP (!) 111/49 (BP Location: Left Arm)   Pulse 81   Temp 99 F (37.2 C) (Oral)   Resp 12   Ht 5' 8"  (1.727 m)   Wt 63.5 kg   SpO2 98%   BMI 21.29 kg/m  General appearance: more comfortable appearing woman laying in bed in no distress but still in chronic pain Head: Normocephalic, without obvious abnormality, atraumatic Eyes: EOMI Lungs: clear to auscultation bilaterally Heart: regular rate and rhythm and S1, S2 normal Abdomen: Large midline abdominal scar noted from multiple prior surgeries.  BS more active today in all 4 quadrants but still TTP throughout which is now baseline she says Extremities: Thin, no edema Skin: dry, scaly Neurologic: Grossly normal  The results of significant diagnostics from this hospitalization (including imaging, microbiology, ancillary and laboratory) are listed below for reference.   Microbiology: No results found for this or any previous visit (from the past 240 hour(s)).   Labs: BNP (last 3 results) Recent Labs    10/12/19 2059 10/28/19 0435 11/26/19 0209  BNP 253.9* 162.4* 498.2*   Basic Metabolic Panel: Recent Labs  Lab 09/08/20 0448 09/09/20 0400 09/10/20 0520 09/11/20 0419 09/12/20 0339  NA 132* 133* 137 139 139  K 4.6 4.0 3.8 3.5 3.6  CL  105 104 107 103 104  CO2 19* 21* 24 29 28   GLUCOSE 126* 137* 119* 123* 111*  BUN 53* 49* 54* 56*  56*  CREATININE 2.02* 1.53* 1.80* 1.58* 1.81*  CALCIUM 9.4 10.0 9.4 9.1 9.2  MG 1.9 2.1 2.2 1.8 2.5*  PHOS 4.8* 2.1* 2.6 2.9 2.6   Liver Function Tests: Recent Labs  Lab 09/07/20 1136 09/08/20 0448 09/11/20 0419 09/12/20 0339  AST 34 31 25 29   ALT 118* 89* 47* 51*  ALKPHOS 223* 206* 144* 173*  BILITOT 0.7 0.5 0.5 0.5  PROT 7.8 7.1 5.9* 6.5  ALBUMIN 3.5 3.2* 2.7* 3.1*   Recent Labs  Lab 09/07/20 1136  LIPASE 23   No results for input(s): AMMONIA in the last 168 hours. CBC: Recent Labs  Lab 09/08/20 0448 09/12/20 0339  WBC 4.9 5.7  NEUTROABS 2.7 2.1  HGB 9.4* 8.1*  HCT 28.3* 24.5*  MCV 89.8 93.2  PLT 206 179   Cardiac Enzymes: No results for input(s): CKTOTAL, CKMB, CKMBINDEX, TROPONINI in the last 168 hours. BNP: Invalid input(s): POCBNP CBG: Recent Labs  Lab 09/10/20 0457 09/10/20 1200 09/11/20 0722 09/12/20 0746 09/12/20 1138  GLUCAP 119* 117* 112* 141* 72   D-Dimer No results for input(s): DDIMER in the last 72 hours. Hgb A1c No results for input(s): HGBA1C in the last 72 hours. Lipid Profile Recent Labs    09/12/20 0339  TRIG 55   Thyroid function studies No results for input(s): TSH, T4TOTAL, T3FREE, THYROIDAB in the last 72 hours.  Invalid input(s): FREET3 Anemia work up No results for input(s): VITAMINB12, FOLATE, FERRITIN, TIBC, IRON, RETICCTPCT in the last 72 hours. Urinalysis    Component Value Date/Time   COLORURINE YELLOW 08/31/2020 0330   APPEARANCEUR CLEAR 08/31/2020 0330   LABSPEC 1.041 (H) 08/31/2020 0330   PHURINE 6.0 08/31/2020 0330   GLUCOSEU NEGATIVE 08/31/2020 0330   HGBUR NEGATIVE 08/31/2020 0330   BILIRUBINUR NEGATIVE 08/31/2020 0330   KETONESUR NEGATIVE 08/31/2020 0330   PROTEINUR 30 (A) 08/31/2020 0330   NITRITE NEGATIVE 08/31/2020 0330   LEUKOCYTESUR NEGATIVE 08/31/2020 0330   Sepsis Labs Invalid input(s): PROCALCITONIN,  WBC,  LACTICIDVEN Microbiology No results found for this or any previous visit (from  the past 240 hour(s)).  Procedures/Studies: CT ABDOMEN PELVIS WO CONTRAST  Result Date: 09/07/2020 CLINICAL DATA:  History of Crohn's disease. Abdominal pain. Rule out small bowel obstruction. Progressive nausea and vomiting EXAM: CT ABDOMEN AND PELVIS WITHOUT CONTRAST TECHNIQUE: Multidetector CT imaging of the abdomen and pelvis was performed following the standard protocol without IV contrast. COMPARISON:  08/31/2020 FINDINGS: Lower chest: No acute findings Hepatobiliary: Within the limitations of unenhanced technique no suspicious liver abnormality identified. Pneumobilia is identified compatible with biliary patency. Previous cholecystectomy. Pancreas: Similar appearance of mild increase caliber of the main pancreatic duct. No signs of pancreatic inflammation or mass. Spleen: Normal in size without focal abnormality. Adrenals/Urinary Tract: Normal appearance of the adrenal glands. The kidneys are unremarkable. No mass or hydronephrosis identified. Moderate bladder distension. No focal bladder abnormality. Stomach/Bowel: The stomach appears unremarkable. There are signs of multiple bowel resections involving both large and small bowel loops with resultant complex anatomy. Evaluation of the bowel loops is further complicated by lack of IV and oral contrast material. Must of the small bowel loops are identified within the right hemiabdomen and measure up to 3.7 cm in maximum diameter. Diminished caliber small bowel loops are noted within the central abdomen. The patient appears to be status post right hemicolectomy.  There is gaseous distension of the distal transverse colon beyond the level of the enterocolonic anastomosis. A large stool burden identified within the distal transverse colon, splenic flexure and descending colon. There are several intervening segments of gas distended colon are noted between stool filled loops of distal colon. Vascular/Lymphatic: There is a right-sided central venous catheter  with the tip in a branch of the hepatic vein. Mild aortic atherosclerosis. No aneurysm. No abdominopelvic adenopathy. Reproductive: Status post hysterectomy. No adnexal masses. Other: No free fluid or fluid collections Musculoskeletal: No acute or significant osseous findings. IMPRESSION: 1. There are signs of multiple bowel resections involving both large and small bowel loops with resultant complex bowel anatomy. Evaluation of the bowel loops is further complicated by lack of IV and oral contrast material. Several dilated loops of small bowel are noted in the right abdomen with air-fluid levels. Cannot exclude partial small bowel obstruction. If there is a continued concern for bowel obstruction and location of possible transition point is of clinical concern then follow-up imaging after administration of oral and IV contrast material is recommended. 2. Large stool burden identified within the distal transverse colon, splenic flexure and descending colon. There are several intervening segments of gas distended colon between stool filled loops of distal colon. Findings may reflect constipation. 3. Pneumobilia compatible with biliary patency. 4. Right-sided central venous catheter with the tip in a branch of the hepatic vein. 5. Aortic atherosclerosis. Aortic Atherosclerosis (ICD10-I70.0). Electronically Signed   By: Kerby Moors M.D.   On: 09/07/2020 15:05   CT ABDOMEN PELVIS W CONTRAST  Result Date: 08/31/2020 CLINICAL DATA:  Abdominal pain fever EXAM: CT ABDOMEN AND PELVIS WITH CONTRAST TECHNIQUE: Multidetector CT imaging of the abdomen and pelvis was performed using the standard protocol following bolus administration of intravenous contrast. CONTRAST:  140m OMNIPAQUE IOHEXOL 300 MG/ML  SOLN COMPARISON:  August 09, 2020 FINDINGS: Lower chest: The visualized heart size within normal limits. No pericardial fluid/thickening. No hiatal hernia. The visualized portions of the lungs are clear. Hepatobiliary:  The liver is normal in density without focal abnormality.The main portal vein is patent. Again noted is pneumobilia likely from prior postsurgical change. Mild common bile duct and intrahepatic biliary ductal dilatation is again noted. Pancreas: There is unchanged pancreatic ductal dilatation. No pancreatic ductal dilatation or surrounding inflammatory changes. Spleen: Normal in size without focal abnormality. Adrenals/Urinary Tract: Both adrenal glands appear normal. The kidneys and collecting system appear normal without evidence of urinary tract calculus or hydronephrosis. Bladder is unremarkable. Stomach/Bowel: The stomach and small bowel are grossly unremarkable. Surgical colonic anastomosis is again identified within the lower abdomen. There is mildly prominent fluid and air-filled loops of colon within the left lower quadrant within the sigmoid colon. There is however apparent wall thickening of the distal sigmoid colon and rectum without surrounding fat stranding changes. Within the mid descending colon again identified is a possible area of focal narrowing which is nonspecific, series 4, image 70. Vascular/Lymphatic: There are no enlarged mesenteric, retroperitoneal, or pelvic lymph nodes. Again noted is a right femoral venous catheter in unchanged position. Reproductive: Scattered aortic atherosclerotic calcifications are seen without aneurysmal dilatation. Other: No evidence of abdominal wall mass or hernia. Musculoskeletal: No acute or significant osseous findings. IMPRESSION: Unchanged pneumobilia and mild common bile duct and pancreatic ductal dilatation. Postsurgical changes within the colon with a mildly prominent fluid and air-filled sigmoid colon. There is apparent wall thickening of the distal sigmoid colon and rectum which may be due to underdistention versus  mild colitis. As on prior exam again noted is a focal area of narrowing within the distal descending colon which could be due to colitis  versus underlying mass lesion. On resolution of symptoms would recommend direct visualization. Aortic Atherosclerosis (ICD10-I70.0). Electronically Signed   By: Prudencio Pair M.D.   On: 08/31/2020 01:45   DG Chest Portable 1 View  Result Date: 08/30/2020 CLINICAL DATA:  Shortness of breath EXAM: PORTABLE CHEST 1 VIEW COMPARISON:  08/02/2020 FINDINGS: Cardiac shadow is stable. Postsurgical changes are noted on right. No focal infiltrate or sizable effusion is seen. No bony abnormality is noted. IMPRESSION: No acute abnormality noted. Electronically Signed   By: Inez Catalina M.D.   On: 08/30/2020 23:19   DG Abd Portable 1V-Small Bowel Obstruction Protocol-24 hr delay  Result Date: 09/10/2020 CLINICAL DATA:  Small-bowel obstruction. EXAM: PORTABLE ABDOMEN - 1 VIEW COMPARISON:  09/09/2020 FINDINGS: Contrast is noted to the level of the rectum. There is persistent gaseous distention of the colon and loops of small bowel. The enteric tube projects over the gastric body. The previously demonstrated tunneled femoral approach central venous catheter is essentially stable. IMPRESSION: Contrast is noted to the level of the rectum. There is persistent gaseous distention of the small bowel and colon. Electronically Signed   By: Constance Holster M.D.   On: 09/10/2020 15:24   DG Abd Portable 1V-Small Bowel Obstruction Protocol-initial, 8 hr delay  Result Date: 09/09/2020 CLINICAL DATA:  8 hour delay protocol. EXAM: PORTABLE ABDOMEN - 1 VIEW COMPARISON:  X-ray dated 09/09/2020 FINDINGS: The NG tube projects over the stomach. Oral contrast is noted within the small bowel and colon. Again noted is a femoral approach tunneled venous catheter. There is no pneumatosis or free air. IMPRESSION: Oral contrast is noted within the small bowel and colon. Electronically Signed   By: Constance Holster M.D.   On: 09/09/2020 21:21   DG Abd Portable 1V  Result Date: 09/09/2020 CLINICAL DATA:  NG placement EXAM: PORTABLE  ABDOMEN - 1 VIEW COMPARISON:  08/11/2020. FINDINGS: NG tube in the stomach. Tip is in the lateral proximal body of the stomach. Side hole near the GE junction. Moderate gas in the colon suggestive of ileus. This is unchanged. Right femoral venous catheter tip in the hepatic IVC. IMPRESSION: NG tip in the lateral stomach with the side hole near the GE junction. Recommend advancing NG. Electronically Signed   By: Franchot Gallo M.D.   On: 09/09/2020 07:58   ECHOCARDIOGRAM COMPLETE  Result Date: 09/03/2020    ECHOCARDIOGRAM REPORT   Patient Name:   ARICA BEVILACQUA Date of Exam: 09/03/2020 Medical Rec #:  [id number]        Height:       68.0 in Accession #:    [id number]       Weight:       140.0 lb Date of Birth:  04-Oct-1961        BSA:          1.756 m Patient Age:    51 years         BP:           157/67 mmHg Patient Gender: F                HR:           64 bpm. Exam Location:  Inpatient Procedure: 2D Echo Indications:    bacteremia  History:        Patient has prior history of Echocardiogram  examinations, most                 recent 05/10/2020. Risk Factors:Hypertension and Former Smoker.  Sonographer:    Jannett Celestine RDCS (AE) Referring Phys: [place]  1. Left ventricular ejection fraction, by estimation, is 60 to 65%. The left ventricle has normal function. The left ventricle has no regional wall motion abnormalities. There is mild concentric left ventricular hypertrophy. Left ventricular diastolic parameters were normal.  2. Right ventricular systolic function is normal. The right ventricular size is normal. There is normal pulmonary artery systolic pressure.  3. Left atrial size was moderately dilated.  4. The mitral valve is normal in structure. No evidence of mitral valve regurgitation. No evidence of mitral stenosis.  5. The aortic valve is normal in structure. Aortic valve regurgitation is not visualized. No aortic stenosis is present.  6. The inferior vena cava is normal in size  with greater than 50% respiratory variability, suggesting right atrial pressure of 3 mmHg. FINDINGS  Left Ventricle: Left ventricular ejection fraction, by estimation, is 60 to 65%. The left ventricle has normal function. The left ventricle has no regional wall motion abnormalities. The left ventricular internal cavity size was normal in size. There is  mild concentric left ventricular hypertrophy. Left ventricular diastolic parameters were normal. Normal left ventricular filling pressure. Right Ventricle: The right ventricular size is normal. No increase in right ventricular wall thickness. Right ventricular systolic function is normal. There is normal pulmonary artery systolic pressure. The tricuspid regurgitant velocity is 2.57 m/s, and  with an assumed right atrial pressure of 3 mmHg, the estimated right ventricular systolic pressure is 73.4 mmHg. Left Atrium: Left atrial size was moderately dilated. Right Atrium: Right atrial size was normal in size. Pericardium: There is no evidence of pericardial effusion. Mitral Valve: The mitral valve is normal in structure. No evidence of mitral valve regurgitation. No evidence of mitral valve stenosis. Tricuspid Valve: The tricuspid valve is normal in structure. Tricuspid valve regurgitation is trivial. No evidence of tricuspid stenosis. Aortic Valve: The aortic valve is normal in structure. Aortic valve regurgitation is not visualized. No aortic stenosis is present. Pulmonic Valve: The pulmonic valve was normal in structure. Pulmonic valve regurgitation is not visualized. No evidence of pulmonic stenosis. Aorta: The aortic root is normal in size and structure. Venous: The inferior vena cava is normal in size with greater than 50% respiratory variability, suggesting right atrial pressure of 3 mmHg. IAS/Shunts: No atrial level shunt detected by color flow Doppler.  LEFT VENTRICLE PLAX 2D LVIDd:         4.50 cm  Diastology LVIDs:         2.65 cm  LV e' medial:    7.62 cm/s  LV PW:         1.20 cm  LV E/e' medial:  12.5 LV IVS:        1.20 cm  LV e' lateral:   11.30 cm/s LVOT diam:     2.30 cm  LV E/e' lateral: 8.4 LV SV:         131 LV SV Index:   75 LVOT Area:     4.15 cm  RIGHT VENTRICLE RV S prime:     11.70 cm/s TAPSE (M-mode): 1.8 cm LEFT ATRIUM             Index       RIGHT ATRIUM           Index LA diam:  3.50 cm 1.99 cm/m  RA Area:     12.00 cm LA Vol (A2C):   76.7 ml 43.67 ml/m RA Volume:   20.70 ml  11.79 ml/m LA Vol (A4C):   50.5 ml 28.75 ml/m LA Biplane Vol: 61.5 ml 35.02 ml/m  AORTIC VALVE LVOT Vmax:   167.00 cm/s LVOT Vmean:  105.000 cm/s LVOT VTI:    0.315 m  AORTA Ao Root diam: 3.10 cm MITRAL VALVE               TRICUSPID VALVE MV Area (PHT): 2.76 cm    TR Peak grad:   26.4 mmHg MV Decel Time: 275 msec    TR Vmax:        257.00 cm/s MV E velocity: 95.10 cm/s                            SHUNTS                            Systemic VTI:  0.32 m                            Systemic Diam: 2.30 cm Skeet Latch MD Electronically signed by Skeet Latch MD Signature Date/Time: 09/03/2020/1:33:11 PM    Final      Time coordinating discharge: Over 64 minutes    Caitlin Dee, MD  [facility] 09/12/2020, 12:15 PM

## 2020-09-12 NOTE — TOC Transition Note (Signed)
Transition of Care [facility]) - CM/SW Discharge Note   Patient Details  Name: INDIAH HEYDEN MRN: [id number] Date of Birth: Mar 25, 1961  Transition of Care [facility]) CM/SW Contact:  Lia Hopping, LCSW Phone Number: 09/12/2020, 10:55 AM   Clinical Narrative:    CSW notified Carolynn Sayers w/ [facility] for TPN services the patient will discharge home today. Pam will notify the home health agency [facility] as well.  CSW notified the patient. No other needs identified.    Final next level of care: [place] Barriers to Discharge: Barriers Resolved   Patient Goals and CMS Choice Patient states their goals for this hospitalization and ongoing recovery are:: Return home      Discharge Placement                      Discharge Plan and Services   Discharge Planning Services: CM Consult Post Acute Care Choice: Home Health            DME Agency:  ([facility])       HH Arranged: TPN HH Agency: [facility], [place] Date [facility] Agency Contacted: 09/12/20 Time HH Agency Contacted: [id number] Representative spoke with at [facility]: [place] ([place]) Interventions     Readmission Risk Interventions Readmission Risk Prevention Plan 09/09/2020 08/05/2020 06/29/2020  Transportation Screening Complete Complete Complete  PCP or Specialist Appt within 3-5 Days - - -  Not Complete comments - - -  HRI or [place] Work Consult for [place] Planning/Counseling - - -  SW consult not completed comments - - -  Palliative Care Screening - - -  Medication Review Press photographer) Complete Complete Complete  PCP or Specialist appointment within 3-5 days of discharge Complete Complete Complete  HRI or Home Care Consult Complete Complete Complete  SW Recovery Care/Counseling Consult Complete Complete Complete  Palliative Care Screening Not Applicable Not Applicable Not [place] Not Applicable Not Applicable Not Applicable

## 2020-09-12 NOTE — Progress Notes (Signed)
  Appointment date and time: 10/25 @ 245pm  Final Antibiotics recommendation/duration: Levofloxacin 750 mg PO q48hours; last dose 10/20   Labs Monitor (check all applied): _x_ Blood cultures x2 (to be done at clinic) __ weekly cbc, cmp, crp __ weekly vancomycin __ trough or __ predialysis level __ other TDM    [facility] address: [place] for Infectious Disease Located in: [facility] Address: [address], [place], [place] [address] Phone: [phone]

## 2020-09-12 NOTE — Care Management Important Message (Signed)
Important Message  Patient Details IM Letter given to the Patient Name: Caitlin Peters MRN: [id number] Date of Birth: 1961-03-04   Medicare Important Message Given:  Yes     [name] 09/12/2020, 11:44 AM

## 2020-09-12 NOTE — Progress Notes (Signed)
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  Repeat bcx out of peripheral is ngtd on 10/07  tte no evidence valvular vegetation  AKI with bactrim, switched to levoflox 10/12   Of note ucx with several CoNS species which are not pathogenic in her cases and represent assyptomatic bacteriuria  -------- Course complicated by partial SBO which seems to have improved or resolving with conservative care 2 more doses of renally dosed levofloxacin (today and 10/20)   - finish renally dosed levofloxacin 750 mg on 10/20  - maintain picc line for now unless recurrent episodes or fever (then will need simultaneous peripheral and picc blood culture)  - we discussed risk of ongoing picc/central line infection, and ideally a port/tunneled cath is desired. She'll talk with her GI doc after discharge regarding antimotility, octreotide, and glp analogue to see if that can help her keep up intake/output, and avoid all central lines   - patient has ID clinic f/u with Dr Tommy Medal on 10/25 @ 245 for planned repeat surveillant blood cx off abx and general ID f/u    Principal Problem:   Bacteremia associated with intravascular line ([place]) Active Problems:   Crohn's colitis ([place])   Crohn disease ([place])   Short gut syndrome   On total parenteral nutrition (TPN)   Abnormal LFTs   Abdominal pain   Scheduled Meds:  (feeding supplement) PROSource Plus  30 mL Oral BID BM   buPROPion  200 mg Oral  Daily   Chlorhexidine Gluconate Cloth  6 each Topical Daily   cycloSPORINE  1 drop Both Eyes BID   dicyclomine  20 mg Oral TID   DULoxetine  90 mg Oral Daily   estradiol  2 mg Oral Daily   feeding supplement (KATE FARMS STANDARD 1.4)  325 mL Oral Daily   hydrALAZINE  50 mg Oral TID   insulin aspart  0-9 Units Subcutaneous Q4H   metoprolol tartrate  100 mg Oral BID   pantoprazole (PROTONIX) IV  40 mg Intravenous Daily   polyvinyl alcohol  1 drop Both Eyes QID   sodium chloride flush  10-40 mL Intracatheter Q12H   Continuous Infusions:  lactated ringers 1,000 mL (09/12/20 0817)   levofloxacin (LEVAQUIN) IV Stopped (01/75/10 2585)   TPN CYCLIC-ADULT (ION)     PRN Meds:.hydrALAZINE, HYDROmorphone (DILAUDID) injection, labetalol, lipase/protease/amylase, ondansetron (ZOFRAN) IV, promethazine, sodium chloride flush, traZODone   SUBJECTIVE: ngt out yesterday's morning No n/v Good intake orally; stable baseline stool output No abd pain No f/c/rash   Review of Systems: ROS Negative 11 point ros unless mentioned above  Allergies  Allergen Reactions   Cefepime Other (See Comments)    Neurotoxicity occurring in setting of AKI. Ceftriaxone tolerated during same admit   Gabapentin Other (See Comments)    unknown   Hyoscyamine Hives and Swelling    Legs swelling   Disorientation   Lyrica [Pregabalin] Other (See Comments)    unknown   Meperidine Hives  Other reaction(s): GI Upset Due to [place]    Topamax [Topiramate] Other (See Comments)    unknown   Zosyn [Piperacillin Sod-Tazobactam So]     Patient reports it makes her vomit, her neck stiff, and her "heart feel funny"   Fentanyl Rash    Pt is allergic to fentanyl patch related to the glue (gives her a rash) Pt states she is NOT allergic to fentanyl IV medicine   Morphine And Related Rash    OBJECTIVE: Vitals:   09/11/20 1328 09/11/20 2158 09/12/20 0627 09/12/20 0903  BP: (!) 112/56 (!)  146/77 119/82 (!) 111/49  Pulse: (!) 41 81 (!) 41 81  Resp: 16 16 18 12   Temp: 98.7 F (37.1 C) 98.6 F (37 C) 98.8 F (37.1 C) 99 F (37.2 C)  TempSrc: Oral  Oral Oral  SpO2: 100% 100% 100% 98%  Weight:      Height:       Body mass index is 21.29 kg/m.  Physical Exam No distress; conversant; NGT out since 10/17 Psych alert/oriented heent atraumatic; per; conj clear  cv rrr no mrg Lungs clear abd soft, no guarding or rebound Ext no edema Skin no rash; Right femoral picc site no tenderness/purulence Neuro nonfocal  Lab Results Lab Results  Component Value Date   WBC 5.7 09/12/2020   HGB 8.1 (L) 09/12/2020   HCT 24.5 (L) 09/12/2020   MCV 93.2 09/12/2020   PLT 179 09/12/2020    Lab Results  Component Value Date   CREATININE 1.81 (H) 09/12/2020   BUN 56 (H) 09/12/2020   NA 139 09/12/2020   K 3.6 09/12/2020   CL 104 09/12/2020   CO2 28 09/12/2020    Lab Results  Component Value Date   ALT 51 (H) 09/12/2020   AST 29 09/12/2020   ALKPHOS 173 (H) 09/12/2020   BILITOT 0.5 09/12/2020     Microbiology: No results found for this or any previous visit (from the past 240 hour(s)).   Imaging: 10/13 abd pelv ct no contrast 1. There are signs of multiple bowel resections involving both large and small bowel loops with resultant complex bowel anatomy. Evaluation of the bowel loops is further complicated by lack of IV and oral contrast material. Several dilated loops of small bowel are noted in the right abdomen with air-fluid levels. Cannot exclude partial small bowel obstruction. If there is a continued concern for bowel obstruction and location of possible transition point is of clinical concern then follow-up imaging after administration of oral and IV contrast material is recommended. 2. Large stool burden identified within the distal transverse colon, splenic flexure and descending colon. There are several intervening segments of gas distended colon between  stool filled loops of distal colon. Findings may reflect constipation. 3. Pneumobilia compatible with biliary patency. 4. Right-sided central venous catheter with the tip in a branch of the hepatic vein. 5. Aortic atherosclerosis.  Jabier Mutton, [place] for Infectious [place] [phone] pager    09/12/2020, 9:54 AM

## 2020-09-13 ENCOUNTER — Other Ambulatory Visit: Payer: Self-pay | Admitting: *Deleted

## 2020-09-13 ENCOUNTER — Ambulatory Visit: Payer: Medicare Other | Admitting: Neurology

## 2020-09-13 DIAGNOSIS — G894 Chronic pain syndrome: Secondary | ICD-10-CM

## 2020-09-13 DIAGNOSIS — K50819 Crohn's disease of both small and large intestine with unspecified complications: Secondary | ICD-10-CM

## 2020-09-13 MED ORDER — TRAZODONE HCL 100 MG PO TABS: 200.0000 mg | ORAL_TABLET | Freq: Every evening | ORAL | 0 refills | Status: DC | PRN

## 2020-09-13 MED ORDER — DULOXETINE HCL 30 MG PO CPEP: 90.0000 mg | ORAL_CAPSULE | Freq: Every day | ORAL | 1 refills | Status: DC

## 2020-09-13 NOTE — Telephone Encounter (Signed)
-----   Message from Germaine Pomfret, [facility] sent at 09/13/2020  8:38 AM EDT ----- Regarding: Refill Request Apolonio Schneiders,  Patient is requesting refills of Duloxetine, Potassium Chloride, and Trazodone. She is also requesting a 90DS of promethazine (#360 tablets) if possible. Please send into [facility]!   Thanks, Doristine Section Clinical Pharmacist [facility] [place]

## 2020-09-13 NOTE — Telephone Encounter (Signed)
I dont see potassium on current med list Okay to refill Trazodone #360?

## 2020-09-19 ENCOUNTER — Ambulatory Visit (INDEPENDENT_AMBULATORY_CARE_PROVIDER_SITE_OTHER): Payer: Medicare Other

## 2020-09-19 ENCOUNTER — Ambulatory Visit (INDEPENDENT_AMBULATORY_CARE_PROVIDER_SITE_OTHER): Payer: Medicare Other | Admitting: Infectious Disease

## 2020-09-19 ENCOUNTER — Encounter: Payer: Self-pay | Admitting: Infectious Disease

## 2020-09-19 ENCOUNTER — Other Ambulatory Visit: Payer: Self-pay

## 2020-09-19 VITALS — BP 211/75 | HR 37 | Temp 98.3°F | Wt 142.0 lb

## 2020-09-19 DIAGNOSIS — Z959 Presence of cardiac and vascular implant and graft, unspecified: Secondary | ICD-10-CM

## 2020-09-19 DIAGNOSIS — R7881 Bacteremia: Secondary | ICD-10-CM | POA: Diagnosis not present

## 2020-09-19 DIAGNOSIS — Z9889 Other specified postprocedural states: Secondary | ICD-10-CM | POA: Diagnosis not present

## 2020-09-19 DIAGNOSIS — T827XXD Infection and inflammatory reaction due to other cardiac and vascular devices, implants and grafts, subsequent encounter: Secondary | ICD-10-CM | POA: Diagnosis present

## 2020-09-19 DIAGNOSIS — Z23 Encounter for immunization: Secondary | ICD-10-CM | POA: Diagnosis present

## 2020-09-19 DIAGNOSIS — K50119 Crohn's disease of large intestine with unspecified complications: Secondary | ICD-10-CM

## 2020-09-19 DIAGNOSIS — A498 Other bacterial infections of unspecified site: Secondary | ICD-10-CM

## 2020-09-19 DIAGNOSIS — A4159 Other Gram-negative sepsis: Secondary | ICD-10-CM

## 2020-09-19 DIAGNOSIS — T80219D Unspecified infection due to central venous catheter, subsequent encounter: Secondary | ICD-10-CM

## 2020-09-19 DIAGNOSIS — K56600 Partial intestinal obstruction, unspecified as to cause: Secondary | ICD-10-CM

## 2020-09-19 DIAGNOSIS — K912 Postsurgical malabsorption, not elsewhere classified: Secondary | ICD-10-CM

## 2020-09-19 NOTE — Progress Notes (Signed)
   Covid-19 Vaccination Clinic  Name:  Caitlin Peters    MRN: [id number] DOB: [birth year]  09/19/2020  [name] was observed post Covid-19 immunization for 15 minutes without incident. She was provided with Vaccine Information Sheet and instruction to access the V-Safe system.   [name] was instructed to call 911 with any severe reactions post vaccine: [name] Kitchen Difficulty breathing  . Swelling of face and throat  . A fast heartbeat  . A bad rash all over body  . Dizziness and weakness     Sherae Santino T Brooks Sailors

## 2020-09-19 NOTE — Progress Notes (Signed)
Subjective:  Chief complaint: followup for another line infection   Patient ID: Caitlin Peters, female    DOB: [birth year], 59 y.o.   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  She currently has a femoral IV access PICC line that was exchanged during her most recent hospitalization when we saw her in February.  She was admitted in February with Enterobacter cloacae bacteremia she was treated with Zosyn then changed to ertapenem for ease of administration at home.  Bacteremia cleared and radiology were able to exchange a new line over her old IV access site.  She then unfortunately developed a stenotrophomonas bacteremia associated with her line.  There went PICC line exchange on 14 April.    She is initially treated with Bactrim which was complicated by acute on chronic elevation in her serum creatinine.  We changed her over to IV levofloxacin that she completed.  I then admitted her for severe abdominal pain which was worked up at [facility].  She then was admitted to [facility] and seen by my partner Dr. Megan Salon on 4 August. Fortunately her blood cultures were negative. A clear-cut cause of her fever was not ascertained. She was treated With levofloxacin that she completed the last visit when I saw her.  Since my last visit with her she was again hospitalized September and treated for an Enterobacter bacteremia and seen by my partner Dr. Johnnye Sima.  Then in October she was again admitted she had some symptoms of fevers and also had some leukopenia blood cultures were taken but only through the central line making it difficult to know whether this was a true positive blood  culture.  It grew stenotrophomonas again.  Seen by Dr. Baxter Flattery and then by Dr. Gale Journey she had transthoracic echocardiogram that was negative for endocarditis.  She had an acute kidney injury on Bactrim and switched over levofloxacin on the 12th.  Her course was complicated a small bowel obstruction that has been now resolved.  She completed antimicrobial therapy on the 20th.  She presents to clinic today and denies any symptoms consistent with infection.  She does ask if her dressing could be changed as she says that she is concerned that it may have become wet when she showered this morning.  I asked if she took precautions of wrapping the bandage in L. Lysle Rubens.  She said that she did this.  [name] when she examined the patient was concerned that the patient has been changing her own dressings and that the bandage not appear to have been appropriately applied.  Referred her back to having it changed by her home health company.    Her line is clean dry and intact Past Medical History:  Diagnosis Date  . Acute pancreatitis 04/13/2020  . Anasarca 10/2019  . AVN (avascular necrosis of bone) ([place])   . Cataract   . Chronic pain syndrome   . CKD (chronic kidney disease), stage III ([place])   . Crohn disease ([place])   . Crohn disease ([place])   . Depression   . Diverticulosis   . GERD (gastroesophageal reflux disease)   . HTN (hypertension)   . IDA (iron deficiency anemia)   .  Malnutrition ([place])   . Mass in chest   . Osteoporosis   . Pancreatitis   . Short gut syndrome   . Vitamin B12 deficiency     Past Surgical History:  Procedure Laterality Date  . ABDOMINAL HYSTERECTOMY    . ABDOMINAL SURGERY     colon resection with abdominal stoma, repair of intestinal leak, reversal of abdominal stoma  . BILIARY DILATION  11/26/2019   Procedure: BILIARY DILATION;  Surgeon: Jackquline Denmark, MD;  Location: [facility];  Service: Endoscopy;;  . BILIARY DILATION  03/08/2020   Procedure: BILIARY DILATION;   Surgeon: Irving Copas., MD;  Location: [place];  Service: Gastroenterology;;  . BIOPSY  03/08/2020   Procedure: BIOPSY;  Surgeon: Irving Copas., MD;  Location: [place];  Service: Gastroenterology;;  . CHEST WALL RESECTION     right thoracotomy,resection of chest mass with anterior rib and reconstruction using prosthetic mesh and video arthroscopy  . CHOLECYSTECTOMY    . COLONOSCOPY  2019  . ERCP N/A 11/26/2019   Procedure: ENDOSCOPIC RETROGRADE CHOLANGIOPANCREATOGRAPHY (ERCP);  Surgeon: Jackquline Denmark, MD;  Location: [facility];  Service: Endoscopy;  Laterality: N/A;  . ERCP N/A 03/08/2020   Procedure: ENDOSCOPIC RETROGRADE CHOLANGIOPANCREATOGRAPHY (ERCP);  Surgeon: Irving Copas., MD;  Location: [place];  Service: Gastroenterology;  Laterality: N/A;  . ESOPHAGOGASTRODUODENOSCOPY N/A 03/08/2020   Procedure: ESOPHAGOGASTRODUODENOSCOPY (EGD);  Surgeon: Irving Copas., MD;  Location: [place];  Service: Gastroenterology;  Laterality: N/A;  . EUS N/A 03/08/2020   Procedure: UPPER ENDOSCOPIC ULTRASOUND (EUS) LINEAR;  Surgeon: Irving Copas., MD;  Location: [place];  Service: Gastroenterology;  Laterality: N/A;  . IR FLUORO GUIDE CV LINE LEFT  01/07/2020  . IR FLUORO GUIDE CV LINE LEFT  03/09/2020  . IR FLUORO GUIDE CV LINE LEFT  05/09/2020  . IR FLUORO GUIDE CV LINE LEFT  07/20/2020  . IR FLUORO GUIDE CV LINE RIGHT  08/05/2020  . IR PTA VENOUS EXCEPT DIALYSIS CIRCUIT  01/07/2020  . IR REMOVAL TUN CV CATH W/O FL  08/05/2020  . IR US GUIDE VASC ACCESS LEFT     x 2 06/17/19 and 09/14/2019  . IR US GUIDE VASC ACCESS RIGHT  08/05/2020  . KNEE SURGERY     right knee   . REMOVAL OF STONES  11/26/2019   Procedure: REMOVAL OF STONES;  Surgeon: Jackquline Denmark, MD;  Location: [facility];  Service: Endoscopy;;  . REMOVAL OF STONES  03/08/2020   Procedure: REMOVAL OF STONES;  Surgeon: Irving Copas., MD;  Location: [place];   Service: Gastroenterology;;  . SMALL INTESTINE SURGERY     x3  . SPHINCTEROTOMY  11/26/2019   Procedure: SPHINCTEROTOMY;  Surgeon: Jackquline Denmark, MD;  Location: [facility];  Service: Endoscopy;;  . UPPER GASTROINTESTINAL ENDOSCOPY      Family History  Problem Relation Age of Onset  . Breast cancer Sister   . Multiple sclerosis Sister   . Diabetes Sister   . Lupus Sister   . Colon cancer Other   . Crohn's disease Other   . Seizures Mother   . Glaucoma Mother   . CAD Father   . Heart disease Father   . Hypertension Father       Social History   Socioeconomic History  . Marital status: Single    Spouse name: Not on file  . Number of children: Not on file  . Years of education: Not on file  . Highest education level: Not on file  Occupational History  . Occupation: disabled  Tobacco Use  . Smoking status: Former Research scientist (life sciences)  . Smokeless tobacco: Never Used  Vaping Use  . Vaping Use: Never used  Substance and Sexual Activity  . Alcohol use: Not Currently  . Drug use: Never  . Sexual activity: Not on file  Other Topics Concern  . Not on file  Social History Narrative  . Not on file   Social Determinants of Health   Financial Resource Strain: Low Risk   . Difficulty of Paying Living Expenses: Not hard at all  Food Insecurity:   . Worried About Charity fundraiser in the Last Year: Not on file  . Ran Out of Food in the Last Year: Not on file  Transportation Needs: No Transportation Needs  . Lack of Transportation (Medical): No  . Lack of Transportation (Non-Medical): No  Physical Activity:   . Days of Exercise per Week: Not on file  . Minutes of Exercise per Session: Not on file  Stress:   . Feeling of Stress : Not on file  Social Connections:   . Frequency of Communication with Friends and Family: Not on file  . Frequency of Social Gatherings with Friends and Family: Not on file  . Attends Religious Services: Not on file  . Active Member of Clubs or  Organizations: Not on file  . Attends Archivist Meetings: Not on file  . Marital Status: Not on file    Allergies  Allergen Reactions  . Cefepime Other (See Comments)    Neurotoxicity occurring in setting of AKI. Ceftriaxone tolerated during same admit  . Gabapentin Other (See Comments)    unknown  . Hyoscyamine Hives and Swelling    Legs swelling   Disorientation  . Lyrica [Pregabalin] Other (See Comments)    unknown  . Meperidine Hives    Other reaction(s): GI Upset Due to [place]   . Topamax [Topiramate] Other (See Comments)    unknown  . Zosyn [Piperacillin Sod-Tazobactam So]     Patient reports it makes her vomit, her neck stiff, and her "heart feel funny"  . Fentanyl Rash    Pt is allergic to fentanyl patch related to the glue (gives her a rash) Pt states she is NOT allergic to fentanyl IV medicine  . Morphine And Related Rash     Current Outpatient Medications:  .  acetaminophen (TYLENOL) 325 MG tablet, Take 650 mg by mouth every 6 (six) hours as needed for mild pain. , Disp: , Rfl:  .  budesonide (ENTOCORT EC) 3 MG 24 hr capsule, TAKE 3 CAPSULES BY MOUTH ONCE DAILY (Patient taking differently: Take 3 mg by mouth daily. ), Disp: 90 capsule, Rfl: 0 .  buPROPion (WELLBUTRIN SR) 100 MG 12 hr tablet, Take 2 tablets by mouth once daily (Patient taking differently: Take 200 mg by mouth daily. ), Disp: 60 tablet, Rfl: 5 .  calcium carbonate (TUMS - DOSED IN MG ELEMENTAL CALCIUM) 500 MG chewable tablet, Chew 1 tablet by mouth 3 (three) times daily as needed for indigestion or heartburn., Disp: , Rfl:  .  cholecalciferol (VITAMIN D3) 25 MCG (1000 UT) tablet, Take 1,000 Units by mouth daily. , Disp: , Rfl:  .  cycloSPORINE (RESTASIS) 0.05 % ophthalmic emulsion, Place 1 drop into both eyes 2 (two) times daily., Disp: , Rfl:  .  dexlansoprazole (DEXILANT) 60 MG capsule, Take 1 capsule (60 mg total) by mouth daily., Disp: 90 capsule, Rfl: 1 .  Dextran 70-Hypromellose  0.1-0.3 % SOLN, Place 1 drop into both eyes 4 (four) times daily., Disp: , Rfl:  .  dicyclomine (BENTYL) 20 MG tablet, Take 20 mg by mouth 3 (three) times daily., Disp: , Rfl:  .  diphenoxylate-atropine (LOMOTIL) 2.5-0.025 MG tablet, Take 1 tablet by mouth 4 (four) times daily as needed for diarrhea or loose stools., Disp: 30 tablet, Rfl: 0 .  DULoxetine (CYMBALTA) 30 MG capsule, Take 3 capsules (90 mg total) by mouth daily., Disp: 90 capsule, Rfl: 1 .  estradiol (ESTRACE) 2 MG tablet, Take 1 tablet (2 mg total) by mouth daily., Disp: 90 tablet, Rfl: 1 .  famotidine (PEPCID) 20 MG tablet, Take 20 mg by mouth at bedtime., Disp: , Rfl:  .  hydrALAZINE (APRESOLINE) 50 MG tablet, Take 1 tablet (50 mg total) by mouth 3 (three) times daily., Disp: 90 tablet, Rfl: 2 .  lipase/protease/amylase (CREON) 36000 UNITS CPEP capsule, Take 1 capsule (36,000 Units total) by mouth 3 (three) times daily as needed (with meals for digestion)., Disp: 180 capsule, Rfl: 0 .  loperamide (IMODIUM) 2 MG capsule, TAKE 1 CAPSULE BY MOUTH AS NEEDED FOR DIARRHEA OR  LOOSE  STOOLS (Patient taking differently: Take 2 mg by mouth as needed for diarrhea or loose stools. ), Disp: 90 capsule, Rfl: 0 .  methadone (DOLOPHINE) 5 MG tablet, Take 1 tablet (5 mg total) by mouth 5 (five) times daily., Disp: 30 tablet, Rfl: 0 .  metoprolol tartrate (LOPRESSOR) 100 MG tablet, Take 1 tablet (100 mg total) by mouth 2 (two) times daily., Disp: 90 tablet, Rfl: 1 .  Multiple Vitamins-Minerals (MULTIVITAMIN ADULT PO), Take 1 tablet by mouth daily., Disp: , Rfl:  .  Omega-3 1000 MG CAPS, Take 1,000 mg by mouth daily. , Disp: , Rfl:  .  PRESCRIPTION MEDICATION, Inject 1 each into the vein daily. Home TPN . [facility] . 1 bag for 16 hours. [phone], Disp: , Rfl:  .  Probiotic Product (PROBIOTIC-10 PO), Take 1 capsule by mouth daily. , Disp: , Rfl:  .  promethazine (PHENERGAN) 25 MG tablet, TAKE 1 TABLET BY MOUTH EVERY 6  HOURS AS NEEDED FOR NAUSEA AND VOMITING, Disp: 90 tablet, Rfl: 0 .  senna-docusate (SENOKOT-S) 8.6-50 MG tablet, Take 1 tablet by mouth 2 (two) times daily., Disp: , Rfl:  .  sucralfate (CARAFATE) 1 GM/10ML suspension, Take 10 mLs (1 g total) by mouth 4 (four) times daily -  with meals and at bedtime., Disp: 420 mL, Rfl: 2 .  traZODone (DESYREL) 100 MG tablet, Take 2 tablets (200 mg total) by mouth at bedtime as needed for sleep., Disp: 360 tablet, Rfl: 0 .  vitamin B-12 (CYANOCOBALAMIN) 100 MCG tablet, Take 1,000 mcg by mouth daily. , Disp: , Rfl:  .  amLODipine (NORVASC) 10 MG tablet, Take 1 tablet (10 mg total) by mouth daily., Disp: 90 tablet, Rfl: 1 .  HYDROmorphone (DILAUDID) 4 MG tablet, Take 4 mg by mouth 4 (four) times daily as needed., Disp: , Rfl:    Review of Systems  Constitutional: Negative for activity change, appetite change, chills, diaphoresis, fatigue, fever and unexpected weight change.  HENT: Negative for congestion, rhinorrhea, sinus pressure, sneezing, sore throat and trouble swallowing.   Eyes: Negative for photophobia and visual disturbance.  Respiratory: Negative for cough, chest tightness, shortness of breath, wheezing and stridor.   Cardiovascular: Negative for chest pain, palpitations and leg swelling.  Gastrointestinal: Negative for blood in stool, constipation, diarrhea, nausea and vomiting.  Genitourinary: Negative for difficulty urinating, dysuria, flank pain and hematuria.  Musculoskeletal: Negative for arthralgias, back pain, gait problem, joint swelling and myalgias.  Skin: Negative for color change, pallor, rash and wound.  Neurological: Negative for dizziness, tremors, weakness and light-headedness.  Hematological: Negative for adenopathy. Does not bruise/bleed easily.  Psychiatric/Behavioral: Negative for agitation, behavioral problems, confusion, decreased concentration, dysphoric mood and sleep disturbance.       Objective:   Physical  Exam Constitutional:      General: She is not in acute distress.    Appearance: She is well-developed. She is not diaphoretic.  HENT:     Head: Normocephalic and atraumatic.     Mouth/Throat:     Pharynx: No oropharyngeal exudate.  Eyes:     General: No scleral icterus.    Conjunctiva/sclera: Conjunctivae normal.  Cardiovascular:     Rate and Rhythm: Normal rate and regular rhythm.  Pulmonary:     Effort: Pulmonary effort is normal. No respiratory distress.     Breath sounds: No wheezing.  Abdominal:     General: There is no distension.     Tenderness: There is no abdominal tenderness.  Musculoskeletal:        General: No tenderness.     Cervical back: Normal range of motion and neck supple.  Skin:    General: Skin is warm and dry.     Coloration: Skin is not pale.     Findings: No erythema or rash.  Neurological:     Mental Status: She is alert and oriented to person, place, and time.     Motor: No abnormal muscle tone.     Coordination: Coordination normal.  Psychiatric:        Attention and Perception: Attention and perception normal.        Mood and Affect: Mood and affect normal. Mood is not anxious.        Speech: Speech normal.        Behavior: Behavior normal.        Thought Content: Thought content normal.        Cognition and Memory: Cognition and memory normal.        Judgment: Judgment normal.    PICC line today September 19, 2020: A bit macerated appearing         Assessment & Plan:    History of recurrent PICC line infections: Her current PICC line is clean dry and intact and is not overtly infected  RN staff concerned about her changing her own dressings.  The past we have never overtly been suspicious of Munchausen's or similar phenomena though I only know of ONE other similar patient that we take care of in the health care system  Gut syndrome continue on TPN with limited IV access options  She she has not been to see GI until  December.  COVID-19 prevention she got her third shot today as a booster of [employer]

## 2020-09-19 NOTE — Progress Notes (Signed)
Asked by Dr Tommy Medal to change PICC dressing if possible, as patient is worried she got it wet when showering today.  Dressing kits not available in clinic, patient did bring her own supplies.  Dressing appears dry and intact with occlusion around border. Patietn uses her PICC for TPN only, is not on IV antibiotics.  PICC is managed by Dr Domenica Fail @ [facility]. Patient called her [place], Mardene Celeste.  Mardene Celeste will see if she can coordinate home health to come out to change the dressing.  Mardene Celeste will also notify Dr Domenica Fail at [facility] (he manages the access). Landis Gandy, RN

## 2020-09-20 ENCOUNTER — Telehealth: Payer: Medicare Other

## 2020-09-23 ENCOUNTER — Inpatient Hospital Stay: Payer: Medicare Other | Admitting: Internal Medicine

## 2020-09-25 ENCOUNTER — Other Ambulatory Visit: Payer: Self-pay | Admitting: Internal Medicine

## 2020-09-29 ENCOUNTER — Telehealth: Payer: Self-pay

## 2020-09-29 NOTE — Telephone Encounter (Signed)
[facility] home health nurse calling in to let Dr know patient HR was irregular. 88 and irregular. Patient states she had it in the past nurse says she hasn't seen that documented  Wanted to let Dr know   Please call and advise   [phone]

## 2020-10-05 ENCOUNTER — Other Ambulatory Visit: Payer: Self-pay

## 2020-10-05 ENCOUNTER — Encounter: Payer: Self-pay | Admitting: Internal Medicine

## 2020-10-05 ENCOUNTER — Ambulatory Visit (INDEPENDENT_AMBULATORY_CARE_PROVIDER_SITE_OTHER): Payer: Medicare Other | Admitting: Internal Medicine

## 2020-10-05 VITALS — BP 135/82 | HR 66 | Temp 98.3°F | Wt 137.5 lb

## 2020-10-05 DIAGNOSIS — Z09 Encounter for follow-up examination after completed treatment for conditions other than malignant neoplasm: Secondary | ICD-10-CM

## 2020-10-05 DIAGNOSIS — K50819 Crohn's disease of both small and large intestine with unspecified complications: Secondary | ICD-10-CM | POA: Diagnosis not present

## 2020-10-05 DIAGNOSIS — R7881 Bacteremia: Secondary | ICD-10-CM

## 2020-10-05 DIAGNOSIS — T827XXD Infection and inflammatory reaction due to other cardiac and vascular devices, implants and grafts, subsequent encounter: Secondary | ICD-10-CM | POA: Diagnosis not present

## 2020-10-05 DIAGNOSIS — K56600 Partial intestinal obstruction, unspecified as to cause: Secondary | ICD-10-CM

## 2020-10-05 DIAGNOSIS — K912 Postsurgical malabsorption, not elsewhere classified: Secondary | ICD-10-CM | POA: Diagnosis not present

## 2020-10-05 DIAGNOSIS — G894 Chronic pain syndrome: Secondary | ICD-10-CM

## 2020-10-05 DIAGNOSIS — N1832 Chronic kidney disease, stage 3b: Secondary | ICD-10-CM

## 2020-10-05 NOTE — Patient Instructions (Signed)
-  Nice seeing you today!!  -Schedule follow up in 6 months or sooner as needed.  -Tetanus and shingles vaccine at your pharmacy.

## 2020-10-05 NOTE — Progress Notes (Signed)
Established Patient Office Visit     This visit occurred during the SARS-CoV-2 public health emergency.  Safety protocols were in place, including screening questions prior to the visit, additional usage of staff PPE, and extensive cleaning of exam room while observing appropriate contact time as indicated for disinfecting solutions.    CC/Reason for Visit: Hospital discharge follow-up  HPI: Caitlin Peters is a 59 y.o. female who is coming in today for the above mentioned reasons.  Caitlin Peters was again hospitalized from 10/6 through 09/12/2020 initially for another stenotrophomonas line infection.  Hospitalization was then complicated with a partial small bowel obstruction requiring NG tube placement.  Thankfully it resolved conservatively.  Since discharge he has followed up with ID, she has had some adjustments made to her TPN, she finally had her appointment with GI at [facility].  They plan to try some medication changes, but it was also mentioned to her the possibility of a small bowel transplant.  She has been doing well since discharge.  She tells me that she tries to maintain her weight between 135 145 pounds.  She is not having as much diarrhea.  They are scheduling her for colonoscopy.  She had her flu vaccine and her Covid booster recently.  Her GI specialist is requesting referrals to nephrology and to cardiology for optimization prior to potential transplant surgery.   Past Medical/Surgical History: Past Medical History:  Diagnosis Date  . Acute pancreatitis 04/13/2020  . Anasarca 10/2019  . AVN (avascular necrosis of bone) ([place])   . Cataract   . Chronic pain syndrome   . CKD (chronic kidney disease), stage III ([place])   . Crohn disease ([place])   . Crohn disease ([place])   . Depression   . Diverticulosis   . GERD (gastroesophageal reflux disease)   . HTN (hypertension)   . IDA (iron deficiency anemia)   . Malnutrition ([place])   . Mass in chest   . Osteoporosis   .  Pancreatitis   . Short gut syndrome   . Vitamin B12 deficiency     Past Surgical History:  Procedure Laterality Date  . ABDOMINAL HYSTERECTOMY    . ABDOMINAL SURGERY     colon resection with abdominal stoma, repair of intestinal leak, reversal of abdominal stoma  . BILIARY DILATION  11/26/2019   Procedure: BILIARY DILATION;  Surgeon: Jackquline Denmark, MD;  Location: [facility];  Service: Endoscopy;;  . BILIARY DILATION  03/08/2020   Procedure: BILIARY DILATION;  Surgeon: Irving Copas., MD;  Location: [place];  Service: Gastroenterology;;  . BIOPSY  03/08/2020   Procedure: BIOPSY;  Surgeon: Irving Copas., MD;  Location: [place];  Service: Gastroenterology;;  . CHEST WALL RESECTION     right thoracotomy,resection of chest mass with anterior rib and reconstruction using prosthetic mesh and video arthroscopy  . CHOLECYSTECTOMY    . COLONOSCOPY  2019  . ERCP N/A 11/26/2019   Procedure: ENDOSCOPIC RETROGRADE CHOLANGIOPANCREATOGRAPHY (ERCP);  Surgeon: Jackquline Denmark, MD;  Location: [facility];  Service: Endoscopy;  Laterality: N/A;  . ERCP N/A 03/08/2020   Procedure: ENDOSCOPIC RETROGRADE CHOLANGIOPANCREATOGRAPHY (ERCP);  Surgeon: Irving Copas., MD;  Location: [place];  Service: Gastroenterology;  Laterality: N/A;  . ESOPHAGOGASTRODUODENOSCOPY N/A 03/08/2020   Procedure: ESOPHAGOGASTRODUODENOSCOPY (EGD);  Surgeon: Irving Copas., MD;  Location: [place];  Service: Gastroenterology;  Laterality: N/A;  . EUS N/A 03/08/2020   Procedure: UPPER ENDOSCOPIC ULTRASOUND (EUS) LINEAR;  Surgeon: Rush Landmark Telford Nab., MD;  Location: [facility]  ENDOSCOPY;  Service: Gastroenterology;  Laterality: N/A;  . IR FLUORO GUIDE CV LINE LEFT  01/07/2020  . IR FLUORO GUIDE CV LINE LEFT  03/09/2020  . IR FLUORO GUIDE CV LINE LEFT  05/09/2020  . IR FLUORO GUIDE CV LINE LEFT  07/20/2020  . IR FLUORO GUIDE CV LINE RIGHT  08/05/2020  . IR PTA VENOUS EXCEPT DIALYSIS CIRCUIT   01/07/2020  . IR REMOVAL TUN CV CATH W/O FL  08/05/2020  . IR US GUIDE VASC ACCESS LEFT     x 2 06/17/19 and 09/14/2019  . IR US GUIDE VASC ACCESS RIGHT  08/05/2020  . KNEE SURGERY     right knee   . REMOVAL OF STONES  11/26/2019   Procedure: REMOVAL OF STONES;  Surgeon: Jackquline Denmark, MD;  Location: [facility];  Service: Endoscopy;;  . REMOVAL OF STONES  03/08/2020   Procedure: REMOVAL OF STONES;  Surgeon: Irving Copas., MD;  Location: [place];  Service: Gastroenterology;;  . SMALL INTESTINE SURGERY     x3  . SPHINCTEROTOMY  11/26/2019   Procedure: SPHINCTEROTOMY;  Surgeon: Jackquline Denmark, MD;  Location: [facility];  Service: Endoscopy;;  . UPPER GASTROINTESTINAL ENDOSCOPY      Social History:  reports that she has quit smoking. She has never used smokeless tobacco. She reports previous alcohol use. She reports that she does not use drugs.  Allergies: Allergies  Allergen Reactions  . Cefepime Other (See Comments)    Neurotoxicity occurring in setting of AKI. Ceftriaxone tolerated during same admit  . Gabapentin Other (See Comments)    unknown  . Hyoscyamine Hives and Swelling    Legs swelling   Disorientation  . Lyrica [Pregabalin] Other (See Comments)    unknown  . Meperidine Hives    Other reaction(s): GI Upset Due to Chrones   . Topamax [Topiramate] Other (See Comments)    unknown  . Zosyn [Piperacillin Sod-Tazobactam So]     Patient reports it makes her vomit, her neck stiff, and her "heart feel funny"  . Fentanyl Rash    Pt is allergic to fentanyl patch related to the glue (gives her a rash) Pt states she is NOT allergic to fentanyl IV medicine  . Morphine And Related Rash    Family History:  Family History  Problem Relation Age of Onset  . Breast cancer Sister   . Multiple sclerosis Sister   . Diabetes Sister   . Lupus Sister   . Colon cancer Other   . Crohn's disease Other   . Seizures Mother   . Glaucoma Mother   . CAD Father   .  Heart disease Father   . Hypertension Father      Current Outpatient Medications:  .  acetaminophen (TYLENOL) 325 MG tablet, Take 650 mg by mouth every 6 (six) hours as needed for mild pain. , Disp: , Rfl:  .  budesonide (ENTOCORT EC) 3 MG 24 hr capsule, TAKE 3 CAPSULES BY MOUTH ONCE DAILY (Patient taking differently: Take 3 mg by mouth daily. ), Disp: 90 capsule, Rfl: 0 .  buPROPion (WELLBUTRIN SR) 100 MG 12 hr tablet, Take 2 tablets by mouth once daily (Patient taking differently: Take 200 mg by mouth daily. ), Disp: 60 tablet, Rfl: 5 .  calcium carbonate (TUMS - DOSED IN MG ELEMENTAL CALCIUM) 500 MG chewable tablet, Chew 1 tablet by mouth 3 (three) times daily as needed for indigestion or heartburn., Disp: , Rfl:  .  cholecalciferol (VITAMIN D3) 25 MCG (1000  UT) tablet, Take 1,000 Units by mouth daily. , Disp: , Rfl:  .  cycloSPORINE (RESTASIS) 0.05 % ophthalmic emulsion, Place 1 drop into both eyes 2 (two) times daily., Disp: , Rfl:  .  dexlansoprazole (DEXILANT) 60 MG capsule, Take 1 capsule (60 mg total) by mouth daily., Disp: 90 capsule, Rfl: 1 .  Dextran 70-Hypromellose 0.1-0.3 % SOLN, Place 1 drop into both eyes 4 (four) times daily., Disp: , Rfl:  .  diphenoxylate-atropine (LOMOTIL) 2.5-0.025 MG tablet, Take 1 tablet by mouth 4 (four) times daily as needed for diarrhea or loose stools., Disp: 30 tablet, Rfl: 0 .  DULoxetine (CYMBALTA) 30 MG capsule, Take 3 capsules (90 mg total) by mouth daily., Disp: 90 capsule, Rfl: 1 .  estradiol (ESTRACE) 2 MG tablet, Take 1 tablet (2 mg total) by mouth daily., Disp: 90 tablet, Rfl: 1 .  famotidine (PEPCID) 20 MG tablet, Take 20 mg by mouth at bedtime., Disp: , Rfl:  .  hydrALAZINE (APRESOLINE) 50 MG tablet, Take 1 tablet (50 mg total) by mouth 3 (three) times daily., Disp: 90 tablet, Rfl: 2 .  HYDROmorphone (DILAUDID) 4 MG tablet, Take 4 mg by mouth 4 (four) times daily as needed., Disp: , Rfl:  .  lipase/protease/amylase (CREON) 36000 UNITS CPEP  capsule, Take 1 capsule (36,000 Units total) by mouth 3 (three) times daily as needed (with meals for digestion)., Disp: 180 capsule, Rfl: 0 .  loperamide (IMODIUM) 2 MG capsule, TAKE 1 CAPSULE BY MOUTH AS NEEDED FOR DIARRHEA OR  LOOSE  STOOLS (Patient taking differently: Take 2 mg by mouth as needed for diarrhea or loose stools. ), Disp: 90 capsule, Rfl: 0 .  methadone (DOLOPHINE) 5 MG tablet, Take 1 tablet (5 mg total) by mouth 5 (five) times daily., Disp: 30 tablet, Rfl: 0 .  metoprolol tartrate (LOPRESSOR) 100 MG tablet, Take 1 tablet (100 mg total) by mouth 2 (two) times daily., Disp: 90 tablet, Rfl: 1 .  Multiple Vitamins-Minerals (MULTIVITAMIN ADULT PO), Take 1 tablet by mouth daily., Disp: , Rfl:  .  Omega-3 1000 MG CAPS, Take 1,000 mg by mouth daily. , Disp: , Rfl:  .  potassium chloride SA (KLOR-CON) 20 MEQ tablet, TAKE TWO TABLETS BY MOUTH ONCE DAILY, Disp: 90 tablet, Rfl: 5 .  PRESCRIPTION MEDICATION, Inject 1 each into the vein daily. Home TPN . [facility] . 1 bag for 16 hours. [phone], Disp: , Rfl:  .  Probiotic Product (PROBIOTIC-10 PO), Take 1 capsule by mouth daily. , Disp: , Rfl:  .  promethazine (PHENERGAN) 25 MG tablet, TAKE ONE TABLET BY MOUTH every SIX hours AS NEEDED FOR NAUSEA AND VOMITING, Disp: 20 tablet, Rfl: 2 .  senna-docusate (SENOKOT-S) 8.6-50 MG tablet, Take 1 tablet by mouth 2 (two) times daily., Disp: , Rfl:  .  sucralfate (CARAFATE) 1 GM/10ML suspension, Take 10 mLs (1 g total) by mouth 4 (four) times daily -  with meals and at bedtime., Disp: 420 mL, Rfl: 2 .  Trace Minerals Cu-Mn-Se-Zn (TRALEMENT IV), Inject 1 mL into the vein. Used in hydration bag 4 times weekly, Disp: , Rfl:  .  traZODone (DESYREL) 100 MG tablet, Take 2 tablets (200 mg total) by mouth at bedtime as needed for sleep., Disp: 360 tablet, Rfl: 0 .  vitamin B-12 (CYANOCOBALAMIN) 100 MCG tablet, Take 1,000 mcg by mouth daily. , Disp: , Rfl:  .  amLODipine (NORVASC) 10  MG tablet, Take 1 tablet (10 mg total) by mouth  daily., Disp: 90 tablet, Rfl: 1  Review of Systems:  Constitutional: Denies fever, chills, diaphoresis. HEENT: Denies photophobia, eye pain, redness, hearing loss, ear pain, congestion, sore throat, rhinorrhea, sneezing, mouth sores, trouble swallowing, neck pain, neck stiffness and tinnitus.   Respiratory: Denies SOB, DOE, cough, chest tightness,  and wheezing.   Cardiovascular: Denies chest pain, palpitations and leg swelling.  Gastrointestinal: Denies nausea, vomiting, abdominal pain, diarrhea, constipation, blood in stool and abdominal distention.  Genitourinary: Denies dysuria, urgency, frequency, hematuria, flank pain and difficulty urinating.  Endocrine: Denies: hot or cold intolerance, sweats, changes in hair or nails, polyuria, polydipsia. Musculoskeletal: Denies myalgias, back pain, joint swelling, arthralgias and gait problem.  Skin: Denies pallor, rash and wound.  Neurological: Denies dizziness, seizures, syncope, weakness, light-headedness, numbness and headaches.  Hematological: Denies adenopathy. Easy bruising, personal or family bleeding history  Psychiatric/Behavioral: Denies suicidal ideation, mood changes, confusion, nervousness, sleep disturbance and agitation    Physical Exam: Vitals:   10/05/20 0905  BP: 135/82  Pulse: 66  Temp: 98.3 F (36.8 C)  TempSrc: Oral  SpO2: 99%  Weight: 137 lb 8 oz (62.4 kg)    Body mass index is 20.91 kg/m.   Constitutional: NAD, calm, comfortable Eyes: PERRL, lids and conjunctivae normal, wears corrective lenses ENMT: Mucous membranes are moist.  Respiratory: clear to auscultation bilaterally, no wheezing, no crackles. Normal respiratory effort. No accessory muscle use.  Cardiovascular: Regular rate and rhythm, no murmurs / rubs / gallops. No extremity edema. Neurologic: CN 2-12 grossly intact. Sensation intact, DTR normal. Strength 5/5 in all 4.  Psychiatric: Normal judgment and  insight. Alert and oriented x 3. Normal mood.    Impression and Plan:  Hospital discharge follow-up Partial small bowel obstruction ([place]) Bacteremia associated with intravascular line, subsequent encounter Crohn's disease of small and large intestines with complication ([name])  Short gut syndrome Chronic pain syndrome Stage 3b chronic kidney disease ([place])   -She has been doing relatively well since her hospital discharge, is tolerating at least 1 good meal a day, in addition to that she does TPN every night.  They have also added hydration bags.  She had follow-up yesterday with her GI specialist at [facility].  They are going to change her medications somewhat, there was also discussion of potential small bowel transplant for which they are requesting referrals to cardiology and nephrology for optimization.  I will place these today.    Patient Instructions  -Nice seeing you today!!  -Schedule follow up in 6 months or sooner as needed.  -Tetanus and shingles vaccine at your pharmacy.     [name], MD [facility]

## 2020-10-06 ENCOUNTER — Other Ambulatory Visit: Payer: Self-pay | Admitting: Internal Medicine

## 2020-10-14 ENCOUNTER — Telehealth: Payer: Self-pay | Admitting: Pharmacist

## 2020-10-14 NOTE — Chronic Care Management (AMB) (Signed)
Chronic Care Management Pharmacy Assistant   Name: Caitlin Peters  MRN: [id number] DOB: 09-04-1961  Reason for Encounter: Medication Review  PCP : [name], [name], MD  Allergies:   Allergies  Allergen Reactions  . Cefepime Other (See Comments)    Neurotoxicity occurring in setting of AKI. Ceftriaxone tolerated during same admit  . Gabapentin Other (See Comments)    unknown  . Hyoscyamine Hives and Swelling    Legs swelling   Disorientation  . Lyrica [Pregabalin] Other (See Comments)    unknown  . Meperidine Hives    Other reaction(s): GI Upset Due to Chrones   . Topamax [Topiramate] Other (See Comments)    unknown  . Zosyn [Piperacillin Sod-Tazobactam So]     Patient reports it makes her vomit, her neck stiff, and her "heart feel funny"  . Fentanyl Rash    Pt is allergic to fentanyl patch related to the glue (gives her a rash) Pt states she is NOT allergic to fentanyl IV medicine  . Morphine And Related Rash    Medications: Outpatient Encounter Medications as of 10/14/2020  Medication Sig  . acetaminophen (TYLENOL) 325 MG tablet Take 650 mg by mouth every 6 (six) hours as needed for mild pain.   [name] Kitchen amLODipine (NORVASC) 10 MG tablet Take 1 tablet (10 mg total) by mouth daily.  . budesonide (ENTOCORT EC) 3 MG 24 hr capsule TAKE 3 CAPSULES BY MOUTH ONCE DAILY (Patient taking differently: Take 3 mg by mouth daily. )  . buPROPion (WELLBUTRIN SR) 100 MG 12 hr tablet Take 2 tablets by mouth once daily (Patient taking differently: Take 200 mg by mouth daily. )  . calcium carbonate (TUMS - DOSED IN MG ELEMENTAL CALCIUM) 500 MG chewable tablet Chew 1 tablet by mouth 3 (three) times daily as needed for indigestion or heartburn.  . cholecalciferol (VITAMIN D3) 25 MCG (1000 UT) tablet Take 1,000 Units by mouth daily.   . cycloSPORINE (RESTASIS) 0.05 % ophthalmic emulsion Place 1 drop into both eyes 2 (two) times daily.  [name] Kitchen dexlansoprazole (DEXILANT) 60 MG capsule Take  1 capsule (60 mg total) by mouth daily.  [name] Kitchen Dextran 70-Hypromellose 0.1-0.3 % SOLN Place 1 drop into both eyes 4 (four) times daily.  . diphenoxylate-atropine (LOMOTIL) 2.5-0.025 MG tablet Take 1 tablet by mouth 4 (four) times daily as needed for diarrhea or loose stools.  . DULoxetine (CYMBALTA) 30 MG capsule Take 3 capsules (90 mg total) by mouth daily.  [name] Kitchen estradiol (ESTRACE) 2 MG tablet Take 1 tablet (2 mg total) by mouth daily.  . famotidine (PEPCID) 20 MG tablet Take 20 mg by mouth at bedtime.  . hydrALAZINE (APRESOLINE) 50 MG tablet Take 1 tablet (50 mg total) by mouth 3 (three) times daily.  [name] Kitchen HYDROmorphone (DILAUDID) 4 MG tablet Take 4 mg by mouth 4 (four) times daily as needed.  . lipase/protease/amylase (CREON) 36000 UNITS CPEP capsule Take 1 capsule (36,000 Units total) by mouth 3 (three) times daily as needed (with meals for digestion).  [name] Kitchen loperamide (IMODIUM) 2 MG capsule TAKE 1 CAPSULE BY MOUTH AS NEEDED FOR DIARRHEA OR  LOOSE  STOOLS (Patient taking differently: Take 2 mg by mouth as needed for diarrhea or loose stools. )  . methadone (DOLOPHINE) 5 MG tablet Take 1 tablet (5 mg total) by mouth 5 (five) times daily.  . metoprolol tartrate (LOPRESSOR) 100 MG tablet Take 1 tablet (100 mg total) by mouth 2 (two) times daily.  . Multiple Vitamins-Minerals (MULTIVITAMIN ADULT  PO) Take 1 tablet by mouth daily.  . Omega-3 1000 MG CAPS Take 1,000 mg by mouth daily.   . potassium chloride SA (KLOR-CON) 20 MEQ tablet TAKE TWO TABLETS BY MOUTH ONCE DAILY  . PRESCRIPTION MEDICATION Inject 1 each into the vein daily. Home TPN . [facility] . 1 bag for 16 hours. [phone]  . Probiotic Product (PROBIOTIC-10 PO) Take 1 capsule by mouth daily.   . promethazine (PHENERGAN) 25 MG tablet TAKE 1 TABLET BY MOUTH EVERY 6 HOURS AS NEEDED FOR NAUSEA AND VOMITING  . senna-docusate (SENOKOT-S) 8.6-50 MG tablet Take 1 tablet by mouth 2 (two) times daily.  . sucralfate (CARAFATE) 1  GM/10ML suspension Take 10 mLs (1 g total) by mouth 4 (four) times daily -  with meals and at bedtime.  . Trace Minerals Cu-Mn-Se-Zn (TRALEMENT IV) Inject 1 mL into the vein. Used in hydration bag 4 times weekly  . traZODone (DESYREL) 100 MG tablet Take 2 tablets (200 mg total) by mouth at bedtime as needed for sleep.  . vitamin B-12 (CYANOCOBALAMIN) 100 MCG tablet Take 1,000 mcg by mouth daily.    No facility-administered encounter medications on file as of 10/14/2020.    Current Diagnosis: Patient Active Problem List   Diagnosis Date Noted  . Abdominal pain 09/07/2020  . Acute metabolic encephalopathy   . Hypomagnesemia   . Partial small bowel obstruction ([place])   . Abnormal CT of the abdomen   . Bacteremia associated with intravascular line ([place]) 08/04/2020  . Enterobacter sepsis ([place]) 07/22/2020  . SIRS (systemic inflammatory response syndrome) ([place]) 07/19/2020  . Anxiety 06/03/2020  . Acute pancreatitis 04/13/2020  . Infection due to Stenotrophomonas maltophilia 03/10/2020  . Abnormal LFTs 03/05/2020  . Pancytopenia ([place]) 03/05/2020  . Central line infection   . On total parenteral nutrition (TPN) 01/02/2020  . Elevated liver enzymes 01/02/2020  . Cholangitis   . Anasarca 10/28/2019  . Acute kidney injury superimposed on chronic kidney disease ([place]) 10/28/2019  . Falls 10/28/2019  . Crohn disease ([place])   . Malnutrition ([place])   . Short gut syndrome   . HTN (hypertension)   . Vitamin B12 deficiency   . Osteoporosis   . Depression   . GERD (gastroesophageal reflux disease)   . Chronic pain syndrome   . Hypokalemia due to excessive gastrointestinal loss of potassium 10/13/2019  . Diarrhea   . Fever   . Hypokalemia 10/12/2019  . Chronic diarrhea 10/12/2019  . Dysuria 10/12/2019  . Bilateral lower extremity edema 10/12/2019  . Crohn's colitis ([place]) 10/12/2019  . Anemia 10/12/2019  . Hypertension 10/12/2019  . AKI (acute kidney injury) ([place]) 10/12/2019    Goals  Addressed   None    Received call from patient regarding medication management via [facility].  Patient requested an acute fill for  . Loperamide (IMODIUM) 2 MG capsule . Diphenoxylate-atropine (LOMOTIL) 2.5-0.025 MG tablet be delivered: 10-14-2020.  Pharmacy needs refills? Yes   Confirmed delivery date of 10-14-2020, advised patient that pharmacy will contact them the morning of delivery.  Follow-Up:  Coordination of Enhanced Pharmacy Services and Pharmacist Review   Maia Breslow, [place] Assistant [phone]

## 2020-10-17 ENCOUNTER — Ambulatory Visit: Payer: Medicare Other | Admitting: Pharmacist

## 2020-10-17 ENCOUNTER — Other Ambulatory Visit: Payer: Self-pay | Admitting: *Deleted

## 2020-10-17 DIAGNOSIS — G894 Chronic pain syndrome: Secondary | ICD-10-CM

## 2020-10-17 DIAGNOSIS — K50819 Crohn's disease of both small and large intestine with unspecified complications: Secondary | ICD-10-CM

## 2020-10-17 DIAGNOSIS — I10 Essential (primary) hypertension: Secondary | ICD-10-CM

## 2020-10-17 DIAGNOSIS — E1169 Type 2 diabetes mellitus with other specified complication: Secondary | ICD-10-CM

## 2020-10-17 DIAGNOSIS — E785 Hyperlipidemia, unspecified: Secondary | ICD-10-CM

## 2020-10-17 MED ORDER — DIPHENOXYLATE-ATROPINE 2.5-0.025 MG PO TABS: 1.0000 | ORAL_TABLET | Freq: Four times a day (QID) | ORAL | 0 refills | Status: DC | PRN

## 2020-10-17 NOTE — Telephone Encounter (Signed)
-----   Message from Viona Gilmore, [facility] sent at 10/17/2020  1:53 PM EST ----- Regarding: Lomotil refill Hi,  Can you please send a refill of Lomotil (diphenoxylate/atropine) to [facility]?  Thank you, Maddie

## 2020-10-17 NOTE — Chronic Care Management (AMB) (Signed)
Chronic Care Management Pharmacy  Name: Caitlin Peters  MRN: [id number] DOB: [birth year]   Chief Complaint/ HPI  Caitlin Peters,  59 y.o. , female presents for their Follow-Up CCM visit with the clinical pharmacist via telephone.  PCP : Caitlin Peters Patient Care Team: Caitlin Peters as PCP - General (Internal Medicine) Caitlin Peters, [facility] as Pharmacist (Pharmacist) Caitlin Peters as Referring Physician (Gastroenterology) Caitlin Peters as Consulting Physician (Internal Medicine)  Their chronic conditions include: Hypertension, GERD, Chronic Kidney Disease, Depression, Osteoporosis and Chronic Pain, Chron's Disease   Office Visits: 10/05/20 Caitlin Peters: Patient presented for hospital follow up for admission from 10/6 through 09/12/2020 initially for another stenotrophomonas line infection. 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 07/08/20 Caitlin Peters: Patient presented for hospital follow up. Patient has completed Abx course and is administering TPN every evening.  06/23/20  Caitlin Peters: Patient presented for chronic conditions follow up and right hand tingling. No changes made. DEXA ordered and referral placed to dermatology and sports medicine.  05/24/20: Patient presented to Dr. Jerilee Peters for hospital follow-up. Kidney function worsened. Patient to continued Zosyn until 05/30/20.  04/19/20: Patient presented to Dr. Jerilee Peters for hospital follow-up.   Consult Visit: 10/04/20 Patient presented for Activase infusion. 09/19/20 Caitlin Evener, Peters (ID): Patient presented for follow up. PICC line is clean dry and intact.   10/6-10/18/21 Patient admitted for stenotrophomonas line infection. 08/30/20 Patient presented to ED with fever and possible infection. 08/02/20-08/12/20 Patient admitted for sepsis. 8/24-8/28/21 Patient admitted for sepsis due to enterobacter.  07/12/20 Patient presented to the ED  with a fever. 07/07/20 Caitlin Leader, Peters (sports med): Patient presented for initial visit for paresthesia. Referral placed for neurology and order placed for cervial spine Xray and NCV with EMG. 06/27/20-07/02/20 Patient admitted for fever. 05/16/20: Patient presented to Dr. Roderic Scarce (ID) for PICC infection. PICC line dry, intact, no signs of infections. To continue with Zosyn.  6/10-6/15/21: Patient hospitalized for Pseudomonas bacteremia. Repeat Cx on 6/15 negative. 5/19-5/24/21: Patient  Hospitalized for intractable nausea and vomiting. Home TPN infusions resumed.  04/13/20: Patient presented to Dr. Roderic Scarce (ID) for infection follow-up. Patient hospitalized for concerns of pancreatitis.  04/01/20: Patient presented to Dr. Domenica Fail (GI) for chron's disease with short bowel syndrome.  03/04/20-03/11/20: Patient hospitalized for sepsis. IV Bactrim until 03/29/20.   Allergies  Allergen Reactions  . Cefepime Other (See Comments)    Neurotoxicity occurring in setting of AKI. Ceftriaxone tolerated during same admit  . Gabapentin Other (See Comments)    unknown  . Hyoscyamine Hives and Swelling    Legs swelling   Disorientation  . Lyrica [Pregabalin] Other (See Comments)    unknown  . Meperidine Hives    Other reaction(s): GI Upset Due to Chrones   . Topamax [Topiramate] Other (See Comments)    unknown  . Zosyn [Piperacillin Sod-Tazobactam So]     Patient reports it makes her vomit, her neck stiff, and her "heart feel funny"  . Fentanyl Rash    Pt is allergic to fentanyl patch related to the glue (gives her a rash) Pt states she is NOT allergic to fentanyl IV medicine  . Morphine And Related Rash    Medications: Outpatient Encounter Medications as of 10/17/2020  Medication Sig  . acetaminophen (TYLENOL) 325 MG tablet Take 650 mg by mouth every 6 (six) hours as needed for mild pain.   [name] Kitchen amLODipine (NORVASC) 10 MG tablet Take  1 tablet (10 mg total) by mouth daily.  . budesonide (ENTOCORT EC) 3 MG  24 hr capsule TAKE 3 CAPSULES BY MOUTH ONCE DAILY (Patient taking differently: Take 3 mg by mouth daily. )  . buPROPion (WELLBUTRIN SR) 100 MG 12 hr tablet Take 2 tablets by mouth once daily (Patient taking differently: Take 200 mg by mouth daily. )  . calcium carbonate (TUMS - DOSED IN MG ELEMENTAL CALCIUM) 500 MG chewable tablet Chew 1 tablet by mouth 3 (three) times daily as needed for indigestion or heartburn.  . cholecalciferol (VITAMIN D3) 25 MCG (1000 UT) tablet Take 1,000 Units by mouth daily.   . cycloSPORINE (RESTASIS) 0.05 % ophthalmic emulsion Place 1 drop into both eyes 2 (two) times daily.  [name] Kitchen dexlansoprazole (DEXILANT) 60 MG capsule Take 1 capsule (60 mg total) by mouth daily.  [name] Kitchen Dextran 70-Hypromellose 0.1-0.3 % SOLN Place 1 drop into both eyes 4 (four) times daily.  . DULoxetine (CYMBALTA) 30 MG capsule Take 3 capsules (90 mg total) by mouth daily.  [name] Kitchen estradiol (ESTRACE) 2 MG tablet Take 1 tablet (2 mg total) by mouth daily.  . famotidine (PEPCID) 20 MG tablet Take 20 mg by mouth at bedtime.  . hydrALAZINE (APRESOLINE) 50 MG tablet Take 1 tablet (50 mg total) by mouth 3 (three) times daily.  [name] Kitchen HYDROmorphone (DILAUDID) 4 MG tablet Take 4 mg by mouth 4 (four) times daily as needed for moderate pain.   [name] Kitchen lipase/protease/amylase (CREON) 36000 UNITS CPEP capsule Take 1 capsule (36,000 Units total) by mouth 3 (three) times daily as needed (with meals for digestion).  [name] Kitchen loperamide (IMODIUM) 2 MG capsule TAKE 1 CAPSULE BY MOUTH AS NEEDED FOR DIARRHEA OR  LOOSE  STOOLS (Patient taking differently: Take 2 mg by mouth as needed for diarrhea or loose stools. )  . methadone (DOLOPHINE) 5 MG tablet Take 1 tablet (5 mg total) by mouth 5 (five) times daily.  . metoprolol tartrate (LOPRESSOR) 100 MG tablet Take 1 tablet (100 mg total) by mouth 2 (two) times daily.  . Multiple Vitamins-Minerals (MULTIVITAMIN ADULT PO) Take 1 tablet by mouth daily.  . potassium chloride SA (KLOR-CON) 20 MEQ tablet TAKE  TWO TABLETS BY MOUTH ONCE DAILY  . PRESCRIPTION MEDICATION Inject 1 each into the vein daily. Home TPN . [facility] . 1 bag for 16 hours. [phone]  . Probiotic Product (PROBIOTIC-10 PO) Take 1 capsule by mouth daily.   . promethazine (PHENERGAN) 25 MG tablet TAKE 1 TABLET BY MOUTH EVERY 6 HOURS AS NEEDED FOR NAUSEA AND VOMITING  . senna-docusate (SENOKOT-S) 8.6-50 MG tablet Take 1 tablet by mouth 2 (two) times daily. (Patient not taking: Reported on 10/21/2020)  . sucralfate (CARAFATE) 1 GM/10ML suspension Take 10 mLs (1 g total) by mouth 4 (four) times daily -  with meals and at bedtime.  . Trace Minerals Cu-Mn-Se-Zn (TRALEMENT IV) Inject 1 mL into the vein. Used in hydration bag 4 times weekly  . traZODone (DESYREL) 100 MG tablet Take 2 tablets (200 mg total) by mouth at bedtime as needed for sleep.  . vitamin B-12 (CYANOCOBALAMIN) 100 MCG tablet Take 1,000 mcg by mouth daily.   . [DISCONTINUED] diphenoxylate-atropine (LOMOTIL) 2.5-0.025 MG tablet Take 1 tablet by mouth 4 (four) times daily as needed for diarrhea or loose stools.  . [DISCONTINUED] Omega-3 1000 MG CAPS Take 1,000 mg by mouth daily.  (Patient not taking: Reported on 10/21/2020)   No facility-administered encounter medications on file as of  10/17/2020.     Current Diagnosis/Assessment:    Goals Addressed            This Visit's Progress   . Patient Caitlin Peters (see longitudinal plan of care for additional care plan information)  Current Barriers:  . Chronic Disease Management support, education, and care coordination needs related to Hypertension, GERD, Chronic Kidney Disease, Depression, Osteoporosis and Chronic Pain, Chron's Disease    Hypertension BP Readings from Last 3 Encounters:  10/22/20 (!) 153/73  10/05/20 135/82  09/19/20 (!) 211/75   . Pharmacist Clinical Goal(s): o Over the next 90 days, patient will work with PharmD and providers to achieve BP  goal <140/90 . Current regimen:  . Amlodipine 10 mg daily  . Hydralazine 50 mg three times daily  . Metoprolol tartrate 100 mg twice daily  . Interventions: o Discussed low salt diet and exercising as tolerated extensively . Patient self care activities - Over the next 90 days, patient will: o Check blood pressure weekly, document, and provide at future appointments o Ensure daily salt intake < 2300 mg/day Osteoporosis . Pharmacist Clinical Goal(s) o Over the next 90 days, patient will work with PharmD and providers to maintain bone health and prevent fractures  . Current regimen:  o Vitamin D3 1000 units daily  o Tums 500 mg three times daily as needed o Reclast 5 mg injection every 12 months . Interventions: o Recommend rechecking DEXA scan  . Patient self care activities - Over the next 90 days, patient will: o Maintain [phone] units of vitamin D daily o Maintain 1200 mg of calcium daily from dietary and supplemental sources.  Medication management . Pharmacist Clinical Goal(s): o Over the next 90 days, patient will work with PharmD and providers to achieve optimal medication adherence . Current pharmacy: [facility] . Interventions o Comprehensive medication review performed. o Switch back to [employer]  . Patient self care activities - Over the next 90 days, patient will: o Take medications as prescribed o Report any questions or concerns to PharmD and/or provider(s)      Hypertension   BP goal is:  <140/90  Office blood pressures are  BP Readings from Last 3 Encounters:  10/22/20 (!) 153/73  10/05/20 135/82  09/19/20 (!) 211/75   CMP Latest Ref Rng & Units 10/22/2020 10/21/2020 09/12/2020  Glucose 70 - 99 mg/dL 91 126(H) 111(H)  BUN 6 - 20 mg/dL 30(H) 34(H) 56(H)  Creatinine 0.44 - 1.00 mg/dL 1.31(H) 1.05(H) 1.81(H)  Sodium 135 - 145 mmol/L 142 139 139  Potassium 3.5 - 5.1 mmol/L 3.4(L) 3.7 3.6  Chloride 98 - 111 mmol/L 107 107 104  CO2 22 - 32  mmol/L 25 23 28   Calcium 8.9 - 10.3 mg/dL 9.4 9.3 9.2  Total Protein 6.5 - 8.1 g/dL - 7.3 6.5  Total Bilirubin 0.3 - 1.2 mg/dL - 0.7 0.5  Alkaline Phos 38 - 126 U/L - 235(H) 173(H)  AST 15 - 41 U/L - 76(H) 29  ALT 0 - 44 U/L - 159(H) 51(H)    Patient checks BP at home weekly   Patient home BP readings are ranging: 140s-150s/80s  Patient has failed these meds in the past: n/a Patient is currently uncontrolled on the following medications:  . Amlodipine 10 mg daily  . Hydralazine 50 mg TID  . Metoprolol tartrate 100 mg BID   We discussed diet and exercise extensively.  -BP monitoring:patient does keep a log  -  Discussed recommendations for moderate aerobic exercise for 150 minutes/week spread out over 5 days for heart healthy lifestyle - patient currently takes the dog outside for walks for her exercise -Diet: sometimes has appetite and sometimes she doesn't; uses TPN daily and hydration bags 4 days of the week   Plan  Continue current medications   Osteopenia / Osteoporosis   Last DEXA Scan: "It's been a while" last checked in [place].    T-Score femoral neck: n/a  T-Score total hip: n/a  T-Score lumbar spine: n/a  T-Score forearm radius: n/a  10-year probability of major osteoporotic fracture: n/a  10-year probability of hip fracture: n/a  VITD  Date Value Ref Range Status  05/24/2020 44.31 30.00 - 100.00 ng/mL Final     Patient is a candidate for pharmacologic treatment.  Patient has failed these meds in past:  Patient is currently controlled on the following medications:  [name] Kitchen Vitamin D3 1000 units daily  . Tums (Typically takes TID)  . Reclast (taking for ~10 years), last dose ~August 2020.   We discussed:  Recommend [phone] units of vitamin D daily. Recommend 1200 mg of calcium daily from dietary and supplemental sources.  Plan Recommend follow-up DEXA Scan.  Given duration of bisphosphonate therapy, would be reasonable to begin bisphosphonate holiday at this  time.   GERD   Patient has failed these meds in past: n/a Patient is currently controlled on the following medications:  . Dexlansoprazole 60 mg daily  . Famotidine 20 mg QHS  . Tums TID PRN   Plan  Continue current medications  Chron's Disease / Short Bowel Syndrome    Patient has failed these meds in past: [employer] Patient is currently uncontrolled on the following medications:  . Budesonide 31m (2 caps qAM) . Creon 36,000 units TID PRN  . Dicyclomine 20 mg TID  . Lomotil 2.5-0.025 mg QID PRN  . Loperamide 2 mg PRN  . Probiotic 2 caps daily  . Promethazine 25 mg q6hr PRN  . Sucralfate 1g/130mQID  . TPN   Plan  Continue current medications   Chronic [facility]  [facility]: n/a Patient is currently controlled on the following medications:  . APAP 325 mg 2 tabs q6hr PRN (headaches/ muscle pain)  . Hydromorphone 4 mg q6hr PRN  . Methadone 5 mg five times daily   We discussed:  Pain is mostly well controlled  Plan  Continue current medications   Depression / Anxiety   PHQ9 Score:  PHQ9 SCORE ONLY 10/05/2020 04/19/2020 10/16/2019  PHQ-9 Total Score 5 4 2    GAD7 Score: No flowsheet data found.  Patient has failed these meds in past: n/a Patient is currently controlled on the following medications:  . Duloxetine 30 mg 3 caps daily  . Bupropion SR 100 mg 2 tablets daily   We discussed:  Mood has been well managed on current regimen.   Plan Recommend decreasing Bupropion to 150 mg daily (Recommended max dose for CrCl 15-60 mL/min).  Insomnia    Patient has failed these meds in past: n/a Patient is currently controlled on the following medications:  . Trazodone 100 mg 2 tabs QHS PRN (once monthly)   We discussed:  Sleep is mostly well controlled, reports she will take trazodone only when she has not slept for multiple days in a row. When she does take it she reports feeling very sedated the next day.    Plan  Recommend decreasing Trazodone to  100 mg QHS PRN.   Misc / OTC    . Cyclosporine 0.05% soln 1 drop BID  . Dextran 70-hypromellose 0.1-0.3% soln 1 drop QID  . Estradiol 2 mg daily  . Multivitamin daily (Centrum 50+)  . Omega-3 1000 mg daily  . Potassium Chloride 40 mEq daily  . Vitamin B12 1000 mcg daily   Plan  Continue current medications   Medication Management   Pt uses [facility] for all medications Uses pill box? Yes  Plan  Patient will switch back to [facility] as she is unhappy with [facility]   Follow up: 3 month phone visit  [name], PharmD Clinical Pharmacist [place] [facility] [phone]

## 2020-10-17 NOTE — Addendum Note (Signed)
Addended by: Westley Hummer B on: 10/17/2020 04:16 PM   Modules accepted: Orders

## 2020-10-17 NOTE — Addendum Note (Signed)
Addended by: Eulas Post on: 10/17/2020 05:47 PM   Modules accepted: Orders

## 2020-10-17 NOTE — Telephone Encounter (Signed)
-----   Message from Viona Gilmore, [facility] sent at 10/17/2020  1:53 PM EST ----- Regarding: Lomotil refill Hi,  Can you please send a refill of Lomotil (diphenoxylate/atropine) to [facility]?  Thank you, Maddie

## 2020-10-21 ENCOUNTER — Emergency Department (HOSPITAL_COMMUNITY): Payer: Medicare Other

## 2020-10-21 ENCOUNTER — Inpatient Hospital Stay (HOSPITAL_COMMUNITY)
Admission: EM | Admit: 2020-10-21 | Discharge: 2020-10-22 | DRG: 388 | Disposition: A | Discharge status: Other Acute Inpatient Hospital | Payer: Medicare Other | Attending: Internal Medicine | Admitting: Internal Medicine

## 2020-10-21 ENCOUNTER — Encounter (HOSPITAL_COMMUNITY): Payer: Self-pay

## 2020-10-21 ENCOUNTER — Inpatient Hospital Stay (HOSPITAL_COMMUNITY): Payer: Medicare Other

## 2020-10-21 DIAGNOSIS — Z83511 Family history of glaucoma: Secondary | ICD-10-CM

## 2020-10-21 DIAGNOSIS — Z833 Family history of diabetes mellitus: Secondary | ICD-10-CM | POA: Diagnosis not present

## 2020-10-21 DIAGNOSIS — R748 Abnormal levels of other serum enzymes: Secondary | ICD-10-CM | POA: Diagnosis not present

## 2020-10-21 DIAGNOSIS — Z800 Family history of malignant neoplasm of digestive organs: Secondary | ICD-10-CM

## 2020-10-21 DIAGNOSIS — Z789 Other specified health status: Secondary | ICD-10-CM | POA: Diagnosis not present

## 2020-10-21 DIAGNOSIS — D509 Iron deficiency anemia, unspecified: Secondary | ICD-10-CM | POA: Diagnosis present

## 2020-10-21 DIAGNOSIS — Z888 Allergy status to other drugs, medicaments and biological substances status: Secondary | ICD-10-CM

## 2020-10-21 DIAGNOSIS — F32A Depression, unspecified: Secondary | ICD-10-CM | POA: Diagnosis present

## 2020-10-21 DIAGNOSIS — F1120 Opioid dependence, uncomplicated: Secondary | ICD-10-CM | POA: Diagnosis present

## 2020-10-21 DIAGNOSIS — K56609 Unspecified intestinal obstruction, unspecified as to partial versus complete obstruction: Secondary | ICD-10-CM | POA: Diagnosis present

## 2020-10-21 DIAGNOSIS — R112 Nausea with vomiting, unspecified: Secondary | ICD-10-CM

## 2020-10-21 DIAGNOSIS — Z7952 Long term (current) use of systemic steroids: Secondary | ICD-10-CM

## 2020-10-21 DIAGNOSIS — Z20822 Contact with and (suspected) exposure to covid-19: Secondary | ICD-10-CM | POA: Diagnosis present

## 2020-10-21 DIAGNOSIS — Z8619 Personal history of other infectious and parasitic diseases: Secondary | ICD-10-CM

## 2020-10-21 DIAGNOSIS — N183 Chronic kidney disease, stage 3 unspecified: Secondary | ICD-10-CM | POA: Diagnosis present

## 2020-10-21 DIAGNOSIS — E46 Unspecified protein-calorie malnutrition: Secondary | ICD-10-CM | POA: Diagnosis present

## 2020-10-21 DIAGNOSIS — I129 Hypertensive chronic kidney disease with stage 1 through stage 4 chronic kidney disease, or unspecified chronic kidney disease: Secondary | ICD-10-CM | POA: Diagnosis present

## 2020-10-21 DIAGNOSIS — K90829 Short bowel syndrome, unspecified: Secondary | ICD-10-CM

## 2020-10-21 DIAGNOSIS — Z832 Family history of diseases of the blood and blood-forming organs and certain disorders involving the immune mechanism: Secondary | ICD-10-CM | POA: Diagnosis not present

## 2020-10-21 DIAGNOSIS — I10 Essential (primary) hypertension: Secondary | ICD-10-CM | POA: Diagnosis present

## 2020-10-21 DIAGNOSIS — Z820 Family history of epilepsy and other diseases of the nervous system: Secondary | ICD-10-CM

## 2020-10-21 DIAGNOSIS — K912 Postsurgical malabsorption, not elsewhere classified: Secondary | ICD-10-CM | POA: Diagnosis present

## 2020-10-21 DIAGNOSIS — Z90711 Acquired absence of uterus with remaining cervical stump: Secondary | ICD-10-CM | POA: Diagnosis not present

## 2020-10-21 DIAGNOSIS — K50919 Crohn's disease, unspecified, with unspecified complications: Secondary | ICD-10-CM

## 2020-10-21 DIAGNOSIS — K219 Gastro-esophageal reflux disease without esophagitis: Secondary | ICD-10-CM | POA: Diagnosis present

## 2020-10-21 DIAGNOSIS — N1832 Chronic kidney disease, stage 3b: Secondary | ICD-10-CM | POA: Diagnosis present

## 2020-10-21 DIAGNOSIS — K5790 Diverticulosis of intestine, part unspecified, without perforation or abscess without bleeding: Secondary | ICD-10-CM | POA: Diagnosis present

## 2020-10-21 DIAGNOSIS — G894 Chronic pain syndrome: Secondary | ICD-10-CM | POA: Diagnosis present

## 2020-10-21 DIAGNOSIS — K8050 Calculus of bile duct without cholangitis or cholecystitis without obstruction: Secondary | ICD-10-CM

## 2020-10-21 DIAGNOSIS — Z885 Allergy status to narcotic agent status: Secondary | ICD-10-CM

## 2020-10-21 DIAGNOSIS — Z87891 Personal history of nicotine dependence: Secondary | ICD-10-CM

## 2020-10-21 DIAGNOSIS — Z8719 Personal history of other diseases of the digestive system: Secondary | ICD-10-CM

## 2020-10-21 DIAGNOSIS — E43 Unspecified severe protein-calorie malnutrition: Secondary | ICD-10-CM | POA: Diagnosis present

## 2020-10-21 DIAGNOSIS — Z0189 Encounter for other specified special examinations: Secondary | ICD-10-CM

## 2020-10-21 DIAGNOSIS — E538 Deficiency of other specified B group vitamins: Secondary | ICD-10-CM | POA: Diagnosis present

## 2020-10-21 DIAGNOSIS — F419 Anxiety disorder, unspecified: Secondary | ICD-10-CM | POA: Diagnosis present

## 2020-10-21 DIAGNOSIS — Z79899 Other long term (current) drug therapy: Secondary | ICD-10-CM | POA: Diagnosis not present

## 2020-10-21 DIAGNOSIS — K5090 Crohn's disease, unspecified, without complications: Secondary | ICD-10-CM | POA: Diagnosis present

## 2020-10-21 DIAGNOSIS — R109 Unspecified abdominal pain: Secondary | ICD-10-CM | POA: Diagnosis present

## 2020-10-21 DIAGNOSIS — K50819 Crohn's disease of both small and large intestine with unspecified complications: Secondary | ICD-10-CM

## 2020-10-21 DIAGNOSIS — Z9049 Acquired absence of other specified parts of digestive tract: Secondary | ICD-10-CM

## 2020-10-21 DIAGNOSIS — Z803 Family history of malignant neoplasm of breast: Secondary | ICD-10-CM

## 2020-10-21 DIAGNOSIS — K5080 Crohn's disease of both small and large intestine without complications: Secondary | ICD-10-CM | POA: Diagnosis present

## 2020-10-21 DIAGNOSIS — T380X5A Adverse effect of glucocorticoids and synthetic analogues, initial encounter: Secondary | ICD-10-CM | POA: Diagnosis present

## 2020-10-21 DIAGNOSIS — E876 Hypokalemia: Secondary | ICD-10-CM | POA: Diagnosis present

## 2020-10-21 DIAGNOSIS — Z8249 Family history of ischemic heart disease and other diseases of the circulatory system: Secondary | ICD-10-CM

## 2020-10-21 DIAGNOSIS — K565 Intestinal adhesions [bands], unspecified as to partial versus complete obstruction: Secondary | ICD-10-CM

## 2020-10-21 DIAGNOSIS — K50818 Crohn's disease of both small and large intestine with other complication: Secondary | ICD-10-CM | POA: Diagnosis present

## 2020-10-21 HISTORY — DX: Calculus of bile duct without cholangitis or cholecystitis without obstruction: K80.50

## 2020-10-21 LAB — CBC WITH DIFFERENTIAL/PLATELET
Abs Immature Granulocytes: 0.01 10*3/uL (ref 0.00–0.07)
Basophils Absolute: 0 10*3/uL (ref 0.0–0.1)
Basophils Relative: 0 %
Eosinophils Absolute: 0 10*3/uL (ref 0.0–0.5)
Eosinophils Relative: 1 %
HCT: 31.3 % — ABNORMAL LOW (ref 36.0–46.0)
Hemoglobin: 10 g/dL — ABNORMAL LOW (ref 12.0–15.0)
Immature Granulocytes: 0 %
Lymphocytes Relative: 31 %
Lymphs Abs: 1.3 10*3/uL (ref 0.7–4.0)
MCH: 29.3 pg (ref 26.0–34.0)
MCHC: 31.9 g/dL (ref 30.0–36.0)
MCV: 91.8 fL (ref 80.0–100.0)
Monocytes Absolute: 0.2 10*3/uL (ref 0.1–1.0)
Monocytes Relative: 6 %
Neutro Abs: 2.6 10*3/uL (ref 1.7–7.7)
Neutrophils Relative %: 62 %
Platelets: 171 10*3/uL (ref 150–400)
RBC: 3.41 MIL/uL — ABNORMAL LOW (ref 3.87–5.11)
RDW: 14.6 % (ref 11.5–15.5)
WBC: 4.1 10*3/uL (ref 4.0–10.5)
nRBC: 0 % (ref 0.0–0.2)

## 2020-10-21 LAB — COMPREHENSIVE METABOLIC PANEL
ALT: 159 U/L — ABNORMAL HIGH (ref 0–44)
AST: 76 U/L — ABNORMAL HIGH (ref 15–41)
Albumin: 3.5 g/dL (ref 3.5–5.0)
Alkaline Phosphatase: 235 U/L — ABNORMAL HIGH (ref 38–126)
Anion gap: 9 (ref 5–15)
BUN: 34 mg/dL — ABNORMAL HIGH (ref 6–20)
CO2: 23 mmol/L (ref 22–32)
Calcium: 9.3 mg/dL (ref 8.9–10.3)
Chloride: 107 mmol/L (ref 98–111)
Creatinine, Ser: 1.05 mg/dL — ABNORMAL HIGH (ref 0.44–1.00)
GFR, Estimated: >60 mL/min (ref >60)
Glucose, Bld: 126 mg/dL — ABNORMAL HIGH (ref 70–99)
Potassium: 3.7 mmol/L (ref 3.5–5.1)
Sodium: 139 mmol/L (ref 135–145)
Total Bilirubin: 0.7 mg/dL (ref 0.3–1.2)
Total Protein: 7.3 g/dL (ref 6.5–8.1)

## 2020-10-21 LAB — RESP PANEL BY RT-PCR (FLU A&B, COVID) ARPGX2
Influenza A by PCR: NEGATIVE
Influenza B by PCR: NEGATIVE
SARS Coronavirus 2 by RT PCR: NEGATIVE

## 2020-10-21 LAB — PREALBUMIN: Prealbumin: 31.1 mg/dL (ref 18–38)

## 2020-10-21 MED ORDER — HYDROMORPHONE HCL 1 MG/ML IJ SOLN
INTRAMUSCULAR | Status: AC
  Filled 2020-10-21: qty 1

## 2020-10-21 MED ORDER — HYDROMORPHONE HCL 1 MG/ML IJ SOLN: 1.0000 mg | INTRAMUSCULAR | Status: DC | PRN

## 2020-10-21 MED ORDER — SODIUM CHLORIDE 0.9% FLUSH
3.0000 mL | Freq: Two times a day (BID) | INTRAVENOUS | Status: DC
  Administered 2020-10-21: 3 mL via INTRAVENOUS

## 2020-10-21 MED ORDER — LACTATED RINGERS IV BOLUS
1000.0000 mL | Freq: Once | INTRAVENOUS | Status: AC
  Administered 2020-10-21: 1000 mL via INTRAVENOUS

## 2020-10-21 MED ORDER — NALOXONE HCL 4 MG/0.1ML NA LIQD: 1.0000 | Freq: Once | NASAL | Status: DC

## 2020-10-21 MED ORDER — METHOCARBAMOL 1000 MG/10ML IJ SOLN
1000.0000 mg | Freq: Four times a day (QID) | INTRAVENOUS | Status: DC | PRN
  Filled 2020-10-21: qty 10

## 2020-10-21 MED ORDER — HYDROMORPHONE HCL 1 MG/ML IJ SOLN
1.0000 mg | INTRAMUSCULAR | Status: AC | PRN
  Administered 2020-10-21 (×2): 1 mg via INTRAVENOUS
  Filled 2020-10-21 (×2): qty 1

## 2020-10-21 MED ORDER — ACETAMINOPHEN 650 MG RE SUPP
650.0000 mg | Freq: Four times a day (QID) | RECTAL | Status: DC | PRN
  Filled 2020-10-21: qty 1

## 2020-10-21 MED ORDER — ONDANSETRON HCL 4 MG/2ML IJ SOLN
4.0000 mg | Freq: Once | INTRAMUSCULAR | Status: AC
  Administered 2020-10-21: 4 mg via INTRAVENOUS

## 2020-10-21 MED ORDER — PROMETHAZINE HCL 25 MG/ML IJ SOLN
12.5000 mg | Freq: Once | INTRAMUSCULAR | Status: AC
  Administered 2020-10-21: 12.5 mg via INTRAVENOUS
  Filled 2020-10-21: qty 1

## 2020-10-21 MED ORDER — METHADONE HCL 5 MG PO TABS: 5.0000 mg | ORAL_TABLET | Freq: Every day | ORAL | Status: DC

## 2020-10-21 MED ORDER — DIATRIZOATE MEGLUMINE & SODIUM 66-10 % PO SOLN
90.0000 mL | Freq: Once | ORAL | Status: AC
  Administered 2020-10-21: 90 mL via NASOGASTRIC
  Filled 2020-10-21: qty 90

## 2020-10-21 MED ORDER — METOPROLOL TARTRATE 5 MG/5ML IV SOLN
5.0000 mg | Freq: Four times a day (QID) | INTRAVENOUS | Status: DC
  Administered 2020-10-21: 5 mg via INTRAVENOUS
  Filled 2020-10-21 (×2): qty 5

## 2020-10-21 MED ORDER — PHENOL 1.4 % MT LIQD
2.0000 | OROMUCOSAL | Status: DC | PRN
  Filled 2020-10-21: qty 177

## 2020-10-21 MED ORDER — DIPHENHYDRAMINE HCL 50 MG/ML IJ SOLN: 12.5000 mg | Freq: Four times a day (QID) | INTRAMUSCULAR | Status: DC | PRN

## 2020-10-21 MED ORDER — PROMETHAZINE HCL 25 MG/ML IJ SOLN
12.5000 mg | INTRAMUSCULAR | Status: DC | PRN
  Administered 2020-10-21 – 2020-10-22 (×3): 12.5 mg via INTRAVENOUS
  Filled 2020-10-21 (×3): qty 1

## 2020-10-21 MED ORDER — MENTHOL 3 MG MT LOZG: 1.0000 | LOZENGE | OROMUCOSAL | Status: DC | PRN

## 2020-10-21 MED ORDER — NALOXONE HCL 0.4 MG/ML IJ SOLN: 0.4000 mg | Freq: Once | INTRAMUSCULAR | Status: DC | PRN

## 2020-10-21 MED ORDER — ORAL CARE MOUTH RINSE: 15.0000 mL | Freq: Two times a day (BID) | OROMUCOSAL | Status: DC

## 2020-10-21 MED ORDER — ENOXAPARIN SODIUM 40 MG/0.4ML ~~LOC~~ SOLN: 40.0000 mg | SUBCUTANEOUS | Status: DC

## 2020-10-21 MED ORDER — PROMETHAZINE HCL 25 MG/ML IJ SOLN
25.0000 mg | INTRAMUSCULAR | Status: AC | PRN
  Administered 2020-10-21 (×2): 25 mg via INTRAVENOUS
  Filled 2020-10-21 (×2): qty 1

## 2020-10-21 MED ORDER — MAGIC MOUTHWASH
15.0000 mL | Freq: Four times a day (QID) | ORAL | Status: DC | PRN
  Filled 2020-10-21: qty 15

## 2020-10-21 MED ORDER — HYDROMORPHONE HCL 1 MG/ML IJ SOLN
1.0000 mg | Freq: Once | INTRAMUSCULAR | Status: AC
  Administered 2020-10-21: 1 mg via INTRAVENOUS
  Filled 2020-10-21: qty 1

## 2020-10-21 MED ORDER — PROCHLORPERAZINE EDISYLATE 10 MG/2ML IJ SOLN
10.0000 mg | Freq: Once | INTRAMUSCULAR | Status: AC
  Administered 2020-10-21: 10 mg via INTRAVENOUS
  Filled 2020-10-21: qty 2

## 2020-10-21 MED ORDER — ALUM & MAG HYDROXIDE-SIMETH 200-200-20 MG/5ML PO SUSP: 30.0000 mL | Freq: Four times a day (QID) | ORAL | Status: DC | PRN

## 2020-10-21 MED ORDER — HYDROMORPHONE HCL 1 MG/ML IJ SOLN
0.5000 mg | INTRAMUSCULAR | Status: DC | PRN
  Administered 2020-10-21 – 2020-10-22 (×8): 1 mg via INTRAVENOUS
  Filled 2020-10-21 (×7): qty 1

## 2020-10-21 MED ORDER — LIP MEDEX EX OINT
1.0000 "application " | TOPICAL_OINTMENT | Freq: Two times a day (BID) | CUTANEOUS | Status: DC
  Administered 2020-10-21: 1 via TOPICAL
  Filled 2020-10-21: qty 7

## 2020-10-21 MED ORDER — HYDRALAZINE HCL 20 MG/ML IJ SOLN: 10.0000 mg | Freq: Three times a day (TID) | INTRAMUSCULAR | Status: DC | PRN

## 2020-10-21 MED ORDER — ACETAMINOPHEN 325 MG PO TABS: 650.0000 mg | ORAL_TABLET | Freq: Four times a day (QID) | ORAL | Status: DC | PRN

## 2020-10-21 MED ORDER — LACTATED RINGERS IV SOLN: INTRAVENOUS | Status: AC

## 2020-10-21 MED ORDER — METOPROLOL TARTRATE 5 MG/5ML IV SOLN: 5.0000 mg | Freq: Four times a day (QID) | INTRAVENOUS | Status: DC | PRN

## 2020-10-21 NOTE — H&P (Signed)
History and Physical        Hospital Admission Note Date: 10/21/2020  Patient name: Caitlin Peters Medical record number: [id number] Date of birth: 07/04/1961 Age: 59 y.o. Gender: female  PCP: [name], [name], MD  Patient coming from: home   Chief Complaint    Chief Complaint  Patient presents with  . 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  Says she has been unable to keep any food down since that time but has been tolerating her medications.  Patient gave herself some IV fluids and TPN yesterday.  She was given fentanyl and Zofran in route via EMS.   ED Course: Afebrile, hemodynamically stable, on room air. Notable Labs: BUN 34, creatinine 1.05, alkaline phosphatase 235, AST 76, ALT 159, Hb 10.0, influenza and Covid negative. Notable Imaging: CT abdomen pelvis concerning for low-grade SBO. Patient received Dilaudid, Phenergan.  General surgery was consulted by the ED   Vitals:   10/21/20 1209 10/21/20 1230  BP: (!) 165/81 (!) 157/80  Pulse: 65 67  Resp: 13 20  Temp:    SpO2: 99% 98%     Review of Systems:  Review of Systems  All other systems reviewed and are negative.   Medical/Social/Family History   Past Medical History: Past Medical History:  Diagnosis Date  . Acute pancreatitis 04/13/2020  . Anasarca 10/2019  . AVN (avascular necrosis of bone) ([place])   . Cataract   . Chronic pain syndrome   . CKD (chronic kidney disease), stage III ([place])   . Crohn disease ([place])   .  Crohn disease ([place])   . Depression   . Diverticulosis   . GERD (gastroesophageal reflux disease)   . HTN (hypertension)   . IDA (iron deficiency anemia)   . Malnutrition ([place])   . Mass in chest   . Osteoporosis   . Pancreatitis   . Short gut syndrome   . Vitamin B12 deficiency     Past Surgical History:  Procedure Laterality Date  . ABDOMINAL HYSTERECTOMY    . ABDOMINAL SURGERY     colon resection with abdominal stoma, repair of intestinal leak, reversal of abdominal stoma  . BILIARY DILATION  11/26/2019   Procedure: BILIARY DILATION;  Surgeon: Jackquline Denmark, MD;  Location: [facility];  Service: Endoscopy;;  . BILIARY DILATION  03/08/2020   Procedure: BILIARY DILATION;  Surgeon: Irving Copas., MD;  Location: [place];  Service: Gastroenterology;;  . BIOPSY  03/08/2020   Procedure: BIOPSY;  Surgeon: Irving Copas., MD;  Location: [place] [facility];  Service: Gastroenterology;;  . CHEST WALL RESECTION     right thoracotomy,resection of chest mass with anterior rib and reconstruction using prosthetic mesh and video arthroscopy  . CHOLECYSTECTOMY    . COLONOSCOPY  2019  . ERCP N/A 11/26/2019   Procedure: ENDOSCOPIC RETROGRADE CHOLANGIOPANCREATOGRAPHY (ERCP);  Surgeon: Jackquline Denmark, MD;  Location: [facility];  Service: Endoscopy;  Laterality: N/A;  . ERCP N/A 03/08/2020   Procedure: ENDOSCOPIC RETROGRADE CHOLANGIOPANCREATOGRAPHY (ERCP);  Surgeon: Irving Copas., MD;  Location: [place];  Service: Gastroenterology;  Laterality: N/A;  . ESOPHAGOGASTRODUODENOSCOPY N/A 03/08/2020   Procedure: ESOPHAGOGASTRODUODENOSCOPY (EGD);  Surgeon: Irving Copas., MD;  Location: [place];  Service: Gastroenterology;  Laterality: N/A;  . EUS N/A 03/08/2020   Procedure: UPPER ENDOSCOPIC ULTRASOUND (EUS) LINEAR;  Surgeon: Irving Copas., MD;  Location: [facility];  Service: Gastroenterology;  Laterality: N/A;  . IR FLUORO GUIDE CV LINE LEFT   01/07/2020  . IR FLUORO GUIDE CV LINE LEFT  03/09/2020  . IR FLUORO GUIDE CV LINE LEFT  05/09/2020  . IR FLUORO GUIDE CV LINE LEFT  07/20/2020  . IR FLUORO GUIDE CV LINE RIGHT  08/05/2020  . IR PTA VENOUS EXCEPT DIALYSIS CIRCUIT  01/07/2020  . IR REMOVAL TUN CV CATH W/O FL  08/05/2020  . IR US GUIDE VASC ACCESS LEFT     x 2 06/17/19 and 09/14/2019  . IR US GUIDE VASC ACCESS RIGHT  08/05/2020  . KNEE SURGERY     right knee   . REMOVAL OF STONES  11/26/2019   Procedure: REMOVAL OF STONES;  Surgeon: Jackquline Denmark, MD;  Location: [facility];  Service: Endoscopy;;  . REMOVAL OF STONES  03/08/2020   Procedure: REMOVAL OF STONES;  Surgeon: Irving Copas., MD;  Location: [place];  Service: Gastroenterology;;  . SMALL INTESTINE SURGERY     x3  . SPHINCTEROTOMY  11/26/2019   Procedure: SPHINCTEROTOMY;  Surgeon: Jackquline Denmark, MD;  Location: [facility];  Service: Endoscopy;;  . UPPER GASTROINTESTINAL ENDOSCOPY      Medications: Prior to Admission medications   Medication Sig Start Date End Date Taking? Authorizing Provider  acetaminophen (TYLENOL) 325 MG tablet Take 650 mg by mouth every 6 (six) hours as needed for mild pain.    Yes [provider]  amLODipine (NORVASC) 10 MG tablet Take 1 tablet (10 mg total) by mouth daily. 06/23/20 10/21/20 Yes Erline Hau, MD  budesonide (ENTOCORT EC) 3 MG 24 hr capsule TAKE 3 CAPSULES BY MOUTH ONCE DAILY Patient taking differently: Take 3 mg by mouth daily.  04/05/20  Yes [name], [name], MD  buPROPion [facility] SR) 100 MG 12 hr tablet Take 2 tablets by mouth once daily Patient taking differently: Take 200 mg by mouth daily.  07/04/20  Yes [name], [name], MD  calcium carbonate (TUMS - DOSED IN MG ELEMENTAL CALCIUM) 500 MG chewable tablet Chew 1 tablet by mouth 3 (three) times daily as needed for indigestion or heartburn.   Yes [provider]  cholecalciferol (VITAMIN D3) 25 MCG (1000 UT) tablet  Take 1,000 Units by mouth daily.    Yes [provider]  cycloSPORINE (RESTASIS) 0.05 % ophthalmic emulsion Place 1 drop into both eyes 2 (two) times daily.   Yes [provider]  dexlansoprazole (DEXILANT) 60 MG capsule Take 1 capsule (60 mg total) by mouth daily. 06/28/20  Yes [name], [name], MD  Dextran 70-Hypromellose 0.1-0.3 % SOLN Place 1 drop into both eyes 4 (four) times daily.   Yes [provider]  diphenoxylate-atropine (LOMOTIL) 2.5-0.025 MG tablet Take 1 tablet by mouth 4 (four) times daily as needed for diarrhea or loose stools. 10/17/20  Yes Burchette, Alinda Sierras, MD  DULoxetine (CYMBALTA) 30 MG capsule Take 3 capsules (90 mg total)  by mouth daily. 09/13/20  Yes [name], [name], MD  estradiol (ESTRACE) 2 MG tablet Take 1 tablet (2 mg total) by mouth daily. 06/28/20  Yes [name], [name], MD  famotidine (PEPCID) 20 MG tablet Take 20 mg by mouth at bedtime.   Yes [provider]  hydrALAZINE (APRESOLINE) 50 MG tablet Take 1 tablet (50 mg total) by mouth 3 (three) times daily. 06/28/20  Yes [name], [name], MD  HYDROmorphone (DILAUDID) 4 MG tablet Take 4 mg by mouth 4 (four) times daily as needed for moderate pain.  09/15/20  Yes [provider]  lipase/protease/amylase (CREON) 36000 UNITS CPEP capsule Take 1 capsule (36,000 Units total) by mouth 3 (three) times daily as needed (with meals for digestion). 06/28/20  Yes [name], [name], MD  loperamide (IMODIUM) 2 MG capsule TAKE 1 CAPSULE BY MOUTH AS NEEDED FOR DIARRHEA OR  LOOSE  STOOLS Patient taking differently: Take 2 mg by mouth as needed for diarrhea or loose stools.  07/29/20  Yes Erline Hau, MD  methadone (DOLOPHINE) 5 MG tablet Take 1 tablet (5 mg total) by mouth 5 (five) times daily. 06/30/20  Yes Florencia Reasons, MD  metoprolol tartrate (LOPRESSOR) 100 MG tablet Take 1 tablet (100 mg total) by mouth 2 (two) times daily. 06/28/20  Yes  [name], [name], MD  Multiple Vitamins-Minerals (MULTIVITAMIN ADULT PO) Take 1 tablet by mouth daily.   Yes [provider]  potassium chloride SA (KLOR-CON) 20 MEQ tablet TAKE TWO TABLETS BY MOUTH ONCE DAILY 09/27/20  Yes [name], [name], MD  PRESCRIPTION MEDICATION Inject 1 each into the vein daily. Home TPN . [facility] . 1 bag for 16 hours. [phone]   Yes [provider]  Probiotic Product (PROBIOTIC-10 PO) Take 1 capsule by mouth daily.    Yes [provider]  promethazine (PHENERGAN) 25 MG tablet TAKE 1 TABLET BY MOUTH EVERY 6 HOURS AS NEEDED FOR NAUSEA AND VOMITING 10/07/20  Yes [name], [name], MD  sucralfate (CARAFATE) 1 GM/10ML suspension Take 10 mLs (1 g total) by mouth 4 (four) times daily -  with meals and at bedtime. 06/28/20  Yes [name], [name], MD  Trace Minerals Cu-Mn-Se-Zn (TRALEMENT IV) Inject 1 mL into the vein. Used in hydration bag 4 times weekly   Yes [provider]  traZODone (DESYREL) 100 MG tablet Take 2 tablets (200 mg total) by mouth at bedtime as needed for sleep. 09/13/20  Yes [name], [name], MD  vitamin B-12 (CYANOCOBALAMIN) 100 MCG tablet Take 1,000 mcg by mouth daily.    Yes [provider]  NARCAN 4 MG/0.1ML LIQD nasal spray kit Place 1 spray into the nose once.  10/14/20   [provider]  senna-docusate (SENOKOT-S) 8.6-50 MG tablet Take 1 tablet by mouth 2 (two) times daily. Patient not taking: Reported on 10/21/2020 08/12/20   Eugenie Filler, MD  sodium chloride 0.9 % infusion Inject into the vein. 10/16/20   [provider]    Allergies:   Allergies  Allergen Reactions  . Cefepime Other (See Comments)    Neurotoxicity occurring in setting of AKI. Ceftriaxone tolerated during same admit  . Gabapentin Other (See Comments)    unknown  . Hyoscyamine Hives and Swelling    Legs swelling   Disorientation   . Lyrica [Pregabalin] Other (See Comments)    unknown  . Meperidine Hives  Other reaction(s): GI Upset Due to [place]   . Topamax [Topiramate] Other (See Comments)    unknown  . Zosyn [Piperacillin Sod-Tazobactam So]     Patient reports it makes her vomit, her neck stiff, and her "heart feel funny"  . Fentanyl Rash    Pt is allergic to fentanyl patch related to the glue (gives her a rash) Pt states she is NOT allergic to fentanyl IV medicine  . Morphine And Related Rash    Social History:  reports that she has quit smoking. She has never used smokeless tobacco. She reports previous alcohol use. She reports that she does not use drugs.  Family History: Family History  Problem Relation Age of Onset  . Breast cancer Sister   . Multiple sclerosis Sister   . Diabetes Sister   . Lupus Sister   . Colon cancer Other   . Crohn's disease Other   . Seizures Mother   . Glaucoma Mother   . CAD Father   . Heart disease Father   . Hypertension Father      Objective   Physical Exam: Blood pressure (!) 157/80, pulse 67, temperature 99.5 F (37.5 C), temperature source Oral, resp. rate 20, SpO2 98 %.  Physical Exam Vitals and nursing note reviewed. Exam conducted with a chaperone present.  Constitutional:      Appearance: Normal appearance.  HENT:     Head: Normocephalic and atraumatic.  Eyes:     Conjunctiva/sclera: Conjunctivae normal.  Cardiovascular:     Rate and Rhythm: Normal rate and regular rhythm.     Heart sounds: Murmur heard.   Pulmonary:     Effort: Pulmonary effort is normal.     Breath sounds: Normal breath sounds.  Abdominal:     General: Bowel sounds are increased.     Tenderness: There is generalized abdominal tenderness.  Musculoskeletal:        General: No swelling or tenderness.     Comments: Right groin PICC line without erythema or sign of infection  Skin:    Coloration: Skin is not jaundiced or pale.  Neurological:     Mental Status: She is  alert. Mental status is at baseline.  Psychiatric:        Mood and Affect: Mood normal.        Behavior: Behavior normal.     LABS on Admission: I have personally reviewed all the labs and imaging below    Basic Metabolic Panel: Recent Labs  Lab 10/21/20 0800  NA 139  K 3.7  CL 107  CO2 23  GLUCOSE 126*  BUN 34*  CREATININE 1.05*  CALCIUM 9.3   Liver Function Tests: Recent Labs  Lab 10/21/20 0800  AST 76*  ALT 159*  ALKPHOS 235*  BILITOT 0.7  PROT 7.3  ALBUMIN 3.5   No results for input(s): LIPASE, AMYLASE in the last 168 hours. No results for input(s): AMMONIA in the last 168 hours. CBC: Recent Labs  Lab 10/21/20 0800  WBC 4.1  NEUTROABS 2.6  HGB 10.0*  HCT 31.3*  MCV 91.8  PLT 171   Cardiac Enzymes: No results for input(s): CKTOTAL, CKMB, CKMBINDEX, TROPONINI in the last 168 hours. BNP: Invalid input(s): POCBNP CBG: No results for input(s): GLUCAP in the last 168 hours.  Radiological Exams on Admission:  CT ABDOMEN PELVIS WO CONTRAST  Result Date: 10/21/2020 CLINICAL DATA:  Abdominal distension history of bowel obstruction and surgeries EXAM: CT ABDOMEN AND PELVIS WITHOUT CONTRAST TECHNIQUE: Multidetector CT imaging of  the abdomen and pelvis was performed following the standard protocol without IV contrast. COMPARISON:  09/07/2020 FINDINGS: Lower chest: No acute abnormality. Hepatobiliary: Stable appearance of the liver. Pneumobilia is present. Cholecystectomy. Pancreas: Stable mild dilatation of the main pancreatic duct. Spleen: Unremarkable. Adrenals/Urinary Tract: Adrenals, kidneys, and bladder are unremarkable. Stomach/Bowel: Stomach is within normal limits. Evidence of prior bowel resections involving large and small bowel. Mild dilatation of several small bowel loops in the upper and lower abdomen without apparent transition point. Degree of dilatation is less than the October study. Vascular/Lymphatic: Mild aortic atherosclerosis. Right femoral  central venous catheter extends to the intrahepatic IVC. No enlarged lymph nodes identified. Reproductive: Status post hysterectomy. No adnexal masses. Other: No ascites.  No new abnormality of the abdominal wall. Musculoskeletal: No acute osseous abnormality. IMPRESSION: Mild dilatation of several small bowel loops in the upper and lower abdomen without apparent transition point. There is suboptimal evaluation in the absence of contrast. May reflect low-grade obstruction. Degree of dilatation is less than the 09/07/2020 study. Electronically Signed   By: Macy Mis M.D.   On: 10/21/2020 09:16      EKG: not done   A & P   Principal Problem:   SBO (small bowel obstruction) ([name]) Active Problems:   Crohn disease ([name])   HTN (hypertension)   Chronic pain syndrome   1. Nausea and vomiting concern for partial SBO a. History of recurrent SBO, multiple abdominal surgeries and Crohn's disease b. Has been tolerating some of Gastrografin her p.o. medications but otherwise not food c. NG tube placed in ED d. General surgery consulted by the ED, appreciate recommendations e. N.p.o.  2. Chronic pain a. On methadone and Dilaudid  b. Monitor QT on telemetry while she is getting methadone and other QT prolonging agents  3. Crohn's disease s/p multiple surgeries and short gut syndrome a. Gets TPN through right groin PICC line, monitor for line infections  4. CKD 3B, above baseline  5. Hypertension a. Holding home p.o. meds b. Scheduled Lopressor IV and as needed hydralazine.     DVT prophylaxis: Lovenox   Code Status: Full Code  Diet: N.p.o. Family Communication: Admission, patients condition and plan of care including tests being ordered have been discussed with the patient who indicates understanding and agrees with the plan and Code Status.  Disposition Plan: The appropriate patient status for this patient is INPATIENT. Inpatient status is judged to be reasonable and necessary in  order to provide the required intensity of service to ensure the patient's safety. [name] presenting symptoms, physical exam findings, and initial radiographic and laboratory data in the context of their chronic comorbidities is felt to place them at high risk for further clinical deterioration. Furthermore, it is not anticipated that the patient will be medically stable for discharge from the hospital within 2 midnights of admission. The following factors support the patient status of inpatient.   " [name] presenting symptoms include nausea and vomiting and abdominal pain. " The worrisome physical exam findings include abdominal pain. " The initial radiographic and laboratory data are worrisome because of SBO. " The chronic co-morbidities include Crohn's disease, recurrent bowel obstruction.   * I certify that at the point of admission it is my clinical judgment that the patient will require inpatient hospital care spanning beyond 2 midnights from the point of admission due to high intensity of service, high risk for further deterioration and high frequency of surveillance required.*  Consultants  . General surgery  Procedures  .  NG tube  Time Spent on Admission: 60 minutes    Harold Hedge, DO [facility]  10/21/2020, 1:19 PM

## 2020-10-21 NOTE — ED Triage Notes (Addendum)
Abd pain,  Nausea and vomiting x 2 days. Hx of bowel obstruction and abd surgeries. Pt on TPN. Given 148mg fentanyl and 419mzofran IV en route by EMS

## 2020-10-21 NOTE — Consult Note (Signed)
Referring Provider: Dr. Marva Panda Primary Care Physician:  [name], [name], MD Primary Gastroenterologist:  Dr. Karmen Bongo GI and Dr. Domenica Fail at [facility]  Reason for Consultation:  SBO  HPI: Caitlin Peters is a 59 y.o. 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 She had been followed by the [facility] clinic since 2012 for management of malabsorption and more recently by Dr. Domenica Fail at [facility].  She has required 7 prior surgeries and multiple complications, once again resulting in short gut syndrome requiring TPN since 2003.    Underwent small bowel transplant evaluation at the [facility] clinic, however, she was not listed as a candidate as she was unable to identify a caregiver to adequately support her postoperatively.  She has chronic diarrhea, malabsorption, iron deficiency anemia requiring IV iron infusions, and severe protein-caloric malnutrition.  She has required multiple hospitalizations for sepsis and line infections (August, September and October 2021). She has PICC for TPN and IVF.  She has frequent electrolyte deficiencies including hypomagnesemia and hypokalemia related to her diarrhea.  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  She has chronic pain related to Crohn's disease and was being managed by pain clinic on Methadone and Hydromorphone.  Surgical history includes s/pcolon resection with abdominal stoma May 2000, repair of intestinal leak June 2000, reversal of abdominal stoma March 2001, small bowel resection 2002, small bowel resection 2003, small bowel resectionfor obstruction2009,ex lap/lysis of adhesions/tap procedure/open  cholecystectomy/omentectomy 01/22/2018. Short gut syndromesince 2003 when she started TPN.Surgical records from the [facility] clinic note approximately 120 cm of small bowel attached to her proximal transverse colon through the rectum. Ileocolonic anastomosis stricture has been dilated in the past.  She presented to [facility] ED due to having persistent vomiting for the past 2 days.  She reported vomiting partially digested food x4 on Wednesday, 5 or 6 episodes on Thursday and one episode this morning.  No hematemesis.  He has generalized abdominal pain.  No fever.  He previously was feeling well with a good appetite.  She has chronic diarrhea for which she takes Lomotil and Imodium.  She typically passes several loose to watery nonbloody diarrhea bowel movements daily.  Her last diarrhea bowel movement was earlier this morning.  Labs in the ED showed a sodium level 139.  Glucose 126.  Creatinine 1.05.  BUN 34.  Alk phos 235.  AST 76.  ALT 159.  Total bili 0.7.  WBC 4.1.  Hemoglobin 10.  Hematocrit 31.3.  MCV 91.8.  Platelet 171.  Influenza a and B negative.  SARS coronavirus 2 negative.  An abdominal/pelvic CT showed mild dilatation of several small bowel loops in the upper and lower abdomen without apparent transition point with possible low-grade obstruction.  The degree of dilatation is less when compared to her prior CT study 09/07/2020.  NG tube was placed in the ED and is to low intermittent suction.  No current drainage in the NG canister at this time.  Nausea has significantly decreased after receiving Phenergan.  She was evaluated by general surgery, no plans for immediate surgical prevention at this time.  She will be transferred to [facility] as soon as a bed is available.  A GI consult was requested to assist with further management as she waits transfer to [facility]. She was seen by Dr. Domenica Fail at [facility] 10/04/2020. At that time, low dose Gattex was recommended (authorization in process)  and her  Budesonide dose was weaned down from  49m to 363mQD. She was referred to nephrology due to a rising creatinine level and cardiology due to an irregular heart rhythm.   CTAP 10/21/2020: Mild dilatation of several small bowel loops in the upper and lower abdomen without apparent transition point. There is suboptimal evaluation in the absence of contrast. May reflect low-grade obstruction. Degree of dilatation is less than the 09/07/2020 study.  Most recent EGD/colonoscopy: - EGD 05/20/2017 - normal EGD, no evidence of Crohn's disease - [facility] - patent ileocolonic anastomosis, healthy    mucosa, normal small ileum and colon.    Past Medical History:  Diagnosis Date  . Acute pancreatitis 04/13/2020  . Anasarca 10/2019  . AVN (avascular necrosis of bone) ([facility] [place]  . Cataract   . Chronic pain syndrome   . CKD (chronic kidney disease), stage III ([facility]  . Crohn disease ([facility]  . Crohn disease ([facility]  . Depression   . Diverticulosis   . GERD (gastroesophageal reflux disease)   . HTN (hypertension)   . IDA (iron deficiency anemia)   . Malnutrition ([facility]  . Mass in chest   . Osteoporosis   . Pancreatitis   . Short gut syndrome   . Vitamin B12 deficiency     Past Surgical History:  Procedure Laterality Date  . ABDOMINAL HYSTERECTOMY    . ABDOMINAL SURGERY     colon resection with abdominal stoma, repair of intestinal leak, reversal of abdominal stoma  . BILIARY DILATION  11/26/2019   Procedure: BILIARY DILATION;  Surgeon: GuJackquline DenmarkMD;  [facility];  Service: Endoscopy;;  . BILIARY DILATION  03/08/2020   Procedure: BILIARY DILATION;  Surgeon: MaIrving Copas MD;  Location: [facility] Service: Gastroenterology;;  . BIOPSY  03/08/2020   Procedure: BIOPSY;  Surgeon: MaIrving Copas MD;  Location: [facility] Service: Gastroenterology;;  . CHEST WALL RESECTION     right thoracotomy,resection of chest  mass with anterior rib and reconstruction using prosthetic mesh and video arthroscopy  . CHOLECYSTECTOMY    . COLONOSCOPY  2019  . ERCP N/A 11/26/2019   Procedure: ENDOSCOPIC RETROGRADE CHOLANGIOPANCREATOGRAPHY (ERCP);  Surgeon: GuJackquline DenmarkMD;  [facility];  Service: Endoscopy;  Laterality: N/A;  . ERCP N/A 03/08/2020   Procedure: ENDOSCOPIC RETROGRADE CHOLANGIOPANCREATOGRAPHY (ERCP);  Surgeon: MaIrving Copas MD;  Location: [facility] Service: Gastroenterology;  Laterality: N/A;  . ESOPHAGOGASTRODUODENOSCOPY N/A 03/08/2020   Procedure: ESOPHAGOGASTRODUODENOSCOPY (EGD);  Surgeon: MaIrving Copas MD;  Location: [facility] Service: Gastroenterology;  Laterality: N/A;  . EUS N/A 03/08/2020   Procedure: UPPER ENDOSCOPIC ULTRASOUND (EUS) LINEAR;  Surgeon: MaIrving Copas MD;  Location: [facility] Service: Gastroenterology;  Laterality: N/A;  . IR FLUORO GUIDE CV LINE LEFT  01/07/2020  . IR FLUORO GUIDE CV LINE LEFT  03/09/2020  . IR FLUORO GUIDE CV LINE LEFT  05/09/2020  . IR FLUORO GUIDE CV LINE LEFT  07/20/2020  . IR FLUORO GUIDE CV LINE RIGHT  08/05/2020  . IR PTA VENOUS EXCEPT DIALYSIS CIRCUIT  01/07/2020  . IR REMOVAL TUN CV CATH W/O FL  08/05/2020  . IR US[place]UIDE VASC ACCESS LEFT  x 2 06/17/19 and 09/14/2019  . IR US GUIDE VASC ACCESS RIGHT  08/05/2020  . KNEE SURGERY     right knee   . REMOVAL OF STONES  11/26/2019   Procedure: REMOVAL OF STONES;  Surgeon: Jackquline Denmark, MD;  [facility];  Service: Endoscopy;;  . REMOVAL OF STONES  03/08/2020   Procedure: REMOVAL OF STONES;  Surgeon: Irving Copas., MD;  Location: [place];  Service: Gastroenterology;;  . SMALL INTESTINE SURGERY     x3  . SPHINCTEROTOMY  11/26/2019   Procedure: SPHINCTEROTOMY;  Surgeon: Jackquline Denmark, MD;  [facility];  Service: Endoscopy;;  . UPPER GASTROINTESTINAL ENDOSCOPY      Prior to Admission medications   Medication Sig Start  Date End Date Taking? Authorizing Provider  acetaminophen (TYLENOL) 325 MG tablet Take 650 mg by mouth every 6 (six) hours as needed for mild pain.    Yes [provider]  amLODipine (NORVASC) 10 MG tablet Take 1 tablet (10 mg total) by mouth daily. 06/23/20 10/21/20 Yes Erline Hau, MD  budesonide (ENTOCORT EC) 3 MG 24 hr capsule TAKE 3 CAPSULES BY MOUTH ONCE DAILY Patient taking differently: Take 3 mg by mouth daily.  04/05/20  Yes [name], [name], MD  buPROPion [facility] SR) 100 MG 12 hr tablet Take 2 tablets by mouth once daily Patient taking differently: Take 200 mg by mouth daily.  07/04/20  Yes [name], [name], MD  calcium carbonate (TUMS - DOSED IN MG ELEMENTAL CALCIUM) 500 MG chewable tablet Chew 1 tablet by mouth 3 (three) times daily as needed for indigestion or heartburn.   Yes [provider]  cholecalciferol (VITAMIN D3) 25 MCG (1000 UT) tablet Take 1,000 Units by mouth daily.    Yes [provider]  cycloSPORINE (RESTASIS) 0.05 % ophthalmic emulsion Place 1 drop into both eyes 2 (two) times daily.   Yes [provider]  dexlansoprazole (DEXILANT) 60 MG capsule Take 1 capsule (60 mg total) by mouth daily. 06/28/20  Yes [name], [name], MD  Dextran 70-Hypromellose 0.1-0.3 % SOLN Place 1 drop into both eyes 4 (four) times daily.   Yes [provider]  diphenoxylate-atropine (LOMOTIL) 2.5-0.025 MG tablet Take 1 tablet by mouth 4 (four) times daily as needed for diarrhea or loose stools. 10/17/20  Yes Burchette, Alinda Sierras, MD  DULoxetine (CYMBALTA) 30 MG capsule Take 3 capsules (90 mg total) by mouth daily. 09/13/20  Yes [name], [name], MD  estradiol (ESTRACE) 2 MG tablet Take 1 tablet (2 mg total) by mouth daily. 06/28/20  Yes [name], [name], MD  famotidine (PEPCID) 20 MG tablet Take 20 mg by mouth at bedtime.   Yes [provider]  hydrALAZINE (APRESOLINE) 50 MG tablet  Take 1 tablet (50 mg total) by mouth 3 (three) times daily. 06/28/20  Yes [name], [name], MD  HYDROmorphone (DILAUDID) 4 MG tablet Take 4 mg by mouth 4 (four) times daily as needed for moderate pain.  09/15/20  Yes [provider]  lipase/protease/amylase (CREON) 36000 UNITS CPEP capsule Take 1 capsule (36,000 Units total) by mouth 3 (three) times daily as needed (with meals for digestion). 06/28/20  Yes [name], [name], MD  loperamide (IMODIUM) 2 MG capsule TAKE 1 CAPSULE BY MOUTH AS NEEDED FOR DIARRHEA OR  LOOSE  STOOLS Patient taking differently: Take 2 mg by mouth as needed for diarrhea or loose stools.  07/29/20  Yes [name], Rosa  Y, MD  methadone (DOLOPHINE) 5 MG tablet Take 1 tablet (5 mg total) by mouth 5 (five) times daily. 06/30/20  Yes Florencia Reasons, MD  metoprolol tartrate (LOPRESSOR) 100 MG tablet Take 1 tablet (100 mg total) by mouth 2 (two) times daily. 06/28/20  Yes [name], [name], MD  Multiple Vitamins-Minerals (MULTIVITAMIN ADULT PO) Take 1 tablet by mouth daily.   Yes [provider]  potassium chloride SA (KLOR-CON) 20 MEQ tablet TAKE TWO TABLETS BY MOUTH ONCE DAILY 09/27/20  Yes [name], [name], MD  PRESCRIPTION MEDICATION Inject 1 each into the vein daily. Home TPN . [facility] . 1 bag for 16 hours. [phone]   Yes [provider]  Probiotic Product (PROBIOTIC-10 PO) Take 1 capsule by mouth daily.    Yes [provider]  promethazine (PHENERGAN) 25 MG tablet TAKE 1 TABLET BY MOUTH EVERY 6 HOURS AS NEEDED FOR NAUSEA AND VOMITING 10/07/20  Yes [name], [name], MD  sucralfate (CARAFATE) 1 GM/10ML suspension Take 10 mLs (1 g total) by mouth 4 (four) times daily -  with meals and at bedtime. 06/28/20  Yes [name], [name], MD  Trace Minerals Cu-Mn-Se-Zn (TRALEMENT IV) Inject 1 mL into the vein. Used in hydration bag 4 times weekly   Yes [provider]  traZODone (DESYREL) 100 MG tablet Take 2 tablets (200 mg total) by mouth at bedtime as needed for sleep. 09/13/20  Yes [name], [name], MD  vitamin B-12 (CYANOCOBALAMIN) 100 MCG tablet Take 1,000 mcg by mouth daily.    Yes [provider]  NARCAN 4 MG/0.1ML LIQD nasal spray kit Place 1 spray into the nose once.  10/14/20   [provider]  senna-docusate (SENOKOT-S) 8.6-50 MG tablet Take 1 tablet by mouth 2 (two) times daily. Patient not taking: Reported on 10/21/2020 08/12/20   Eugenie Filler, MD  sodium chloride 0.9 % infusion Inject into the vein. 10/16/20   [provider]    Current Facility-Administered Medications  Medication Dose Route Frequency Provider Last Rate Last Admin  . acetaminophen (TYLENOL) tablet 650 mg  650 mg Oral Q6H PRN Harold Hedge, MD       Or  . acetaminophen (TYLENOL) suppository 650 mg  650 mg Rectal Q6H PRN Harold Hedge, MD      . alum & mag hydroxide-simeth (MAALOX/MYLANTA) 200-200-20 MG/5ML suspension 30 mL  30 mL Oral Q6H PRN Michael Boston, MD      . diatrizoate meglumine-sodium (GASTROGRAFIN) 66-10 % solution 90 mL  90 mL Per NG tube Once Michael Boston, MD      . diphenhydrAMINE (BENADRYL) injection 12.5-25 mg  12.5-25 mg Intravenous Q6H PRN Michael Boston, MD      . enoxaparin (LOVENOX) injection 40 mg  40 mg Subcutaneous Q24H Marva Panda E, MD      . hydrALAZINE (APRESOLINE) injection 10 mg  10 mg Intravenous Q8H PRN Harold Hedge, MD      . HYDROmorphone (DILAUDID) injection 0.5-1 mg  0.5-1 mg Intravenous Q2H PRN Michael Boston, MD      . lactated ringers bolus 1,000 mL  1,000 mL Intravenous Once Michael Boston, MD      . lactated ringers infusion   Intravenous Continuous Harold Hedge, MD      . lip balm (CARMEX) ointment 1 application  1 application Topical BID Michael Boston, MD      . magic mouthwash  15 mL  Oral QID PRN Michael Boston, MD      . menthol-cetylpyridinium (CEPACOL) lozenge 3 mg  1 lozenge  Oral PRN Michael Boston, MD      . methocarbamol (ROBAXIN) 1,000 mg in dextrose 5 % 100 mL IVPB  1,000 mg Intravenous Q6H PRN Michael Boston, MD      . metoprolol tartrate (LOPRESSOR) injection 5 mg  5 mg Intravenous Q6H Harold Hedge, MD      . naloxone [facility]) injection 0.4 mg  0.4 mg Intravenous Once PRN Harold Hedge, MD      . phenol (CHLORASEPTIC) mouth spray 2 spray  2 spray Mouth/Throat PRN Michael Boston, MD      . promethazine (PHENERGAN) injection 25 mg  25 mg Intravenous Q4H PRN Harold Hedge, MD   25 mg at 10/21/20 0839  . sodium chloride flush (NS) 0.9 % injection 3 mL  3 mL Intravenous Q12H Harold Hedge, MD        Allergies as of 10/21/2020 - Review Complete 10/21/2020  Allergen Reaction Noted  . Cefepime Other (See Comments) 11/27/2017  . Gabapentin Other (See Comments) 10/13/2019  . Hyoscyamine Hives and Swelling 07/15/2014  . Lyrica [pregabalin] Other (See Comments) 10/13/2019  . Meperidine Hives 08/14/2011  . Topamax [topiramate] Other (See Comments) 10/13/2019  . Zosyn [piperacillin sod-tazobactam so]  08/03/2020  . Fentanyl Rash 10/12/2019  . Morphine and related Rash 10/12/2019    Family History  Problem Relation Age of Onset  . Breast cancer Sister   . Multiple sclerosis Sister   . Diabetes Sister   . Lupus Sister   . Colon cancer Other   . Crohn's disease Other   . Seizures Mother   . Glaucoma Mother   . CAD Father   . Heart disease Father   . Hypertension Father     Social History   Socioeconomic History  . Marital status: Single    Spouse name: Not on file  . Number of children: Not on file  . Years of education: Not on file  . Highest education level: Not on file  Occupational History  . Occupation: disabled  Tobacco Use  . Smoking status: Former Research scientist (life sciences)  . Smokeless tobacco: Never Used  Vaping Use  . Vaping Use: Never used  Substance and Sexual Activity  . Alcohol use: Not Currently  . Drug use: Never  . Sexual activity: Not on file   Other Topics Concern  . Not on file  Social History Narrative  . Not on file   Social Determinants of Health   Financial Resource Strain: Low Risk   . Difficulty of Paying Living Expenses: Not hard at all  Food Insecurity:   . Worried About Charity fundraiser in the Last Year: Not on file  . Ran Out of Food in the Last Year: Not on file  Transportation Needs: No Transportation Needs  . Lack of Transportation (Medical): No  . Lack of Transportation (Non-Medical): No  Physical Activity:   . Days of Exercise per Week: Not on file  . Minutes of Exercise per Session: Not on file  Stress:   . Feeling of Stress : Not on file  Social Connections:   . Frequency of Communication with Friends and Family: Not on file  . Frequency of Social Gatherings with Friends and Family: Not on file  . Attends Religious Services: Not on file  . Active Member of Clubs or Organizations: Not on file  . Attends Archivist  Meetings: Not on file  . Marital Status: Not on file  Intimate Partner Violence:   . Fear of Current or Ex-Partner: Not on file  . Emotionally Abused: Not on file  . Physically Abused: Not on file  . Sexually Abused: Not on file    Review of Systems: See HPI, all other systems reviewed and are negative  Physical Exam: Vital signs in last 24 hours: Temp:  [98.7 F (37.1 C)-99.5 F (37.5 C)] 98.7 F (37.1 C) (11/26 1506) Pulse Rate:  [39-72] 42 (11/26 1506) Resp:  [12-28] 19 (11/26 1506) BP: (147-185)/(72-90) 185/72 (11/26 1506) SpO2:  [97 %-100 %] 98 % (11/26 1506)   General:  Alert ill-appearing 59 year old female in no acute distress. Head:  Normocephalic and atraumatic. Eyes:  No scleral icterus. Conjunctiva pink. Ears:  Normal auditory acuity. Nose:  No deformity, discharge or lesions. Mouth:  Dentition intact. No ulcers or lesions.  Neck:  Supple. No lymphadenopathy or thyromegaly.  Lungs: Breath sounds diminished throughout. Heart: Irregular rhythm, no  murmurs. Abdomen: Soft, nondistended.  Mild generalized tenderness without rebound or guarding.  Positive bowel sounds auscultated to all 4 quadrants when NG tube clamped.  Multiple scars. Rectal: Deferred. Musculoskeletal:  Symmetrical without gross deformities.  Pulses:  Normal pulses noted. Extremities:  Without clubbing or edema. Neurologic:  Alert and  oriented x4. No focal deficits.  Skin:  Intact without significant lesions or rashes. Psych:  Alert and cooperative. Normal mood and affect. Right femoral triple-lumen catheter intact.  Intake/Output from previous day: No intake/output data recorded. Intake/Output this shift: No intake/output data recorded.  Lab Results: Recent Labs    10/21/20 0800  WBC 4.1  HGB 10.0*  HCT 31.3*  PLT 171   BMET Recent Labs    10/21/20 0800  NA 139  K 3.7  CL 107  CO2 23  GLUCOSE 126*  BUN 34*  CREATININE 1.05*  CALCIUM 9.3   LFT Recent Labs    10/21/20 0800  PROT 7.3  ALBUMIN 3.5  AST 76*  ALT 159*  ALKPHOS 235*  BILITOT 0.7   PT/INR No results for input(s): LABPROT, INR in the last 72 hours. Hepatitis Panel No results for input(s): HEPBSAG, HCVAB, HEPAIGM, HEPBIGM in the last 72 hours.    Studies/Results: CT ABDOMEN PELVIS WO CONTRAST  Result Date: 10/21/2020 CLINICAL DATA:  Abdominal distension history of bowel obstruction and surgeries EXAM: CT ABDOMEN AND PELVIS WITHOUT CONTRAST TECHNIQUE: Multidetector CT imaging of the abdomen and pelvis was performed following the standard protocol without IV contrast. COMPARISON:  09/07/2020 FINDINGS: Lower chest: No acute abnormality. Hepatobiliary: Stable appearance of the liver. Pneumobilia is present. Cholecystectomy. Pancreas: Stable mild dilatation of the main pancreatic duct. Spleen: Unremarkable. Adrenals/Urinary Tract: Adrenals, kidneys, and bladder are unremarkable. Stomach/Bowel: Stomach is within normal limits. Evidence of prior bowel resections involving large and  small bowel. Mild dilatation of several small bowel loops in the upper and lower abdomen without apparent transition point. Degree of dilatation is less than the October study. Vascular/Lymphatic: Mild aortic atherosclerosis. Right femoral central venous catheter extends to the intrahepatic IVC. No enlarged lymph nodes identified. Reproductive: Status post hysterectomy. No adnexal masses. Other: No ascites.  No new abnormality of the abdominal wall. Musculoskeletal: No acute osseous abnormality. IMPRESSION: Mild dilatation of several small bowel loops in the upper and lower abdomen without apparent transition point. There is suboptimal evaluation in the absence of contrast. May reflect low-grade obstruction. Degree of dilatation is less than the 09/07/2020 study. Electronically Signed  By: Macy Mis M.D.   On: 10/21/2020 09:16    IMPRESSION/PLAN:  86.  59 year old female with complex Crohn's disease status post numerous bowel surgeries resulting in short gut syndrome admitted to the hospital with N/V and abdominal pain.  CTAP showed mild dilatation of several small bowel loops in the upper and lower abdomen without apparent transition point concerning for a low-grade obstruction.  NG tube placed in the ED.  Nausea is controlled.  [facility].  She is hemodynamically stable. -N.p.o. -Continue IV fluids -Continue NG tube to low intermittent suction -Phenergan as needed -? Restart TPN -Further recommendations per Dr. Bryan Lemma  2.  Chronic diarrhea, secondary to # 1  3.  Chronically elevated alk phos and LFTs. CTAP without evidence of liver disease or intra or extrahepatic dilatation. Possible due to chronic TPN. Hep B surface ag and HCV nonreactive 04/2020. ANA negative. SMA 9.  IGG 1059.  -Consider MRCP at [facility] -Repeat hepatic panel in am. AMA.   4. Chronic normocytic anemia secondary to #1, stable     Noralyn Pick  10/21/2020, 4:08 PM

## 2020-10-21 NOTE — ED Provider Notes (Signed)
[place] DEPT Provider Note   CSN: [id number] Arrival date & time: 10/21/20  8250     History Chief Complaint  Patient presents with  . Abdominal Pain    Caitlin Peters is a 59 y.o. female.  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 She also has had multiple PICC line infections and has a current PICC line in her right groin.  Patient has a chief complaint of abdominal pain, nausea and vomiting.  Her symptoms started getting worse yesterday.  She has had 3 or 4 episodes of emesis in the last 24 hours.  She has severe abdominal pain that is described as dull and consistent with her prior abdominal pain that was because of small bowel obstruction.  Patient is having loose bowel movements, but that is common because of her Crohn's.  Patient gave herself some IV fluid and TPN prior to ED arrival. She denies any fevers, chills.  Past Medical History:  Diagnosis Date  . Acute pancreatitis 04/13/2020  . Anasarca 10/2019  . AVN (avascular necrosis of bone) ([place])   . Cataract   . Chronic pain syndrome   . CKD (chronic kidney disease), stage III ([place])   . Crohn disease ([place])   . Crohn disease ([place])   . Depression   . Diverticulosis   . GERD (gastroesophageal reflux disease)   . HTN (hypertension)   . IDA (iron deficiency anemia)   . Malnutrition ([place])   . Mass in chest   . Osteoporosis   . Pancreatitis   . Short gut syndrome   . Vitamin B12 deficiency     Patient Active Problem List   Diagnosis Date Noted  . History of central line-associated bloodstream infection (CLABSI) 10/21/2020  . Abdominal pain 09/07/2020  . Acute metabolic encephalopathy   . Hypomagnesemia   . Partial small bowel obstruction ([place])   . Bacteremia associated with intravascular line ([place]) 08/04/2020  . Enterobacter sepsis ([place])  07/22/2020  . SIRS (systemic inflammatory response syndrome) ([place]) 07/19/2020  . Anxiety 06/03/2020  . Acute pancreatitis 04/13/2020  . Infection due to Stenotrophomonas maltophilia 03/10/2020  . Pancytopenia ([place]) 03/05/2020  . Central line infection   . Elevated liver enzymes 01/02/2020  . Cholangitis   . Anasarca 10/28/2019  . Acute kidney injury superimposed on chronic kidney disease ([place]) 10/28/2019  . Falls 10/28/2019  . Malnutrition ([place])   . Vitamin B12 deficiency   . GERD (gastroesophageal reflux disease)   . Chronic pain syndrome   . Hypokalemia due to excessive gastrointestinal loss of potassium 10/13/2019  . Fever   . Hypokalemia 10/12/2019  . Chronic diarrhea 10/12/2019  . Dysuria 10/12/2019  . Bilateral lower extremity edema 10/12/2019  . AKI (acute kidney injury) ([place]) 10/12/2019  . Fungemia 08/27/2019  . IDA (iron deficiency anemia) 11/03/2018  . Dilation of biliary tract 08/28/2018  . Severe diarrhea 03/07/2018  . LFTs abnormal 01/27/2018  . GI tract obstruction ([place]) 01/27/2018  . CKD (chronic kidney disease) stage 3, GFR 30-59 ml/min ([name]) 11/30/2017  . Gram-negative bacteremia 09/13/2017  . HTN (hypertension), benign 12/02/2016  . Intractable pain 12/02/2016  . Anemia 12/02/2016  . 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 Avascular necrosis ([place]) 06/14/2015  . Crohn's disease of small and large intestines with complication ([place]) 03/70/4888  . Polyarthralgia 05/03/2015  . On total parenteral nutrition (TPN)  12/30/2014  . Osteoporosis 12/24/2014  . Low back pain 12/16/2014  . Vitamin D deficiency 12/16/2014  . 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 Depression 07/24/2012  . Gram-positive bacteremia 07/24/2012  . Small bowel obstruction due to adhesions ([place]) 07/24/2012  . Diarrhea 12/13/2011  . Neuralgia and neuritis 06/01/2011  . Myalgia and myositis 08/12/2003    Past Surgical History:  Procedure Laterality  Date  . ABDOMINAL HYSTERECTOMY    . ABDOMINAL SURGERY     colon resection with abdominal stoma, repair of intestinal leak, reversal of abdominal stoma  . BILIARY DILATION  11/26/2019   Procedure: BILIARY DILATION;  Surgeon: Jackquline Denmark, MD;  Location: [facility];  Service: Endoscopy;;  . BILIARY DILATION  03/08/2020   Procedure: BILIARY DILATION;  Surgeon: Irving Copas., MD;  Location: [facility];  Service: Gastroenterology;;  . BIOPSY  03/08/2020   Procedure: BIOPSY;  Surgeon: Irving Copas., MD;  Location: [facility];  Service: Gastroenterology;;  . CHEST WALL RESECTION     right thoracotomy,resection of chest mass with anterior rib and reconstruction using prosthetic mesh and video arthroscopy  . CHOLECYSTECTOMY    . COLONOSCOPY  2019  . ERCP N/A 11/26/2019   Procedure: ENDOSCOPIC RETROGRADE CHOLANGIOPANCREATOGRAPHY (ERCP);  Surgeon: Jackquline Denmark, MD;  Location: [facility];  Service: Endoscopy;  Laterality: N/A;  . ERCP N/A 03/08/2020   Procedure: ENDOSCOPIC RETROGRADE CHOLANGIOPANCREATOGRAPHY (ERCP);  Surgeon: Irving Copas., MD;  Location: [facility];  Service: Gastroenterology;  Laterality: N/A;  . ESOPHAGOGASTRODUODENOSCOPY N/A 03/08/2020   Procedure: ESOPHAGOGASTRODUODENOSCOPY (EGD);  Surgeon: Irving Copas., MD;  Location: [facility];  Service: Gastroenterology;  Laterality: N/A;  . EUS N/A 03/08/2020   Procedure: UPPER ENDOSCOPIC ULTRASOUND (EUS) LINEAR;  Surgeon: Irving Copas., MD;  Location: [facility];  Service: Gastroenterology;  Laterality: N/A;  . IR FLUORO GUIDE CV LINE LEFT  01/07/2020  . IR FLUORO GUIDE CV LINE LEFT  03/09/2020  . IR FLUORO GUIDE CV LINE LEFT  05/09/2020  . IR FLUORO GUIDE CV LINE LEFT  07/20/2020  . IR FLUORO GUIDE CV LINE RIGHT  08/05/2020  . IR PTA VENOUS EXCEPT DIALYSIS CIRCUIT  01/07/2020  . IR REMOVAL TUN CV CATH W/O FL  08/05/2020  . IR US GUIDE VASC ACCESS LEFT     x 2 06/17/19 and 09/14/2019    . IR US GUIDE VASC ACCESS RIGHT  08/05/2020  . KNEE SURGERY     right knee   . REMOVAL OF STONES  11/26/2019   Procedure: REMOVAL OF STONES;  Surgeon: Jackquline Denmark, MD;  Location: [facility];  Service: Endoscopy;;  . REMOVAL OF STONES  03/08/2020   Procedure: REMOVAL OF STONES;  Surgeon: Irving Copas., MD;  Location: [facility];  Service: Gastroenterology;;  . SMALL INTESTINE SURGERY     x3  . SPHINCTEROTOMY  11/26/2019   Procedure: SPHINCTEROTOMY;  Surgeon: Jackquline Denmark, MD;  Location: [facility];  Service: Endoscopy;;  . UPPER GASTROINTESTINAL ENDOSCOPY       OB History   No obstetric history on file.     Family History  Problem Relation Age of Onset  . Breast cancer Sister   . Multiple sclerosis Sister   . Diabetes Sister   . Lupus Sister   . Colon cancer Other   . Crohn's disease Other   . Seizures Mother   . Glaucoma Mother   . CAD Father   . Heart disease Father   . Hypertension  Father     Social History   Tobacco Use  . Smoking status: Former Research scientist (life sciences)  . Smokeless tobacco: Never Used  Vaping Use  . Vaping Use: Never used  Substance Use Topics  . Alcohol use: Not Currently  . Drug use: Never    Home Medications Prior to Admission medications   Medication Sig Start Date End Date Taking? Authorizing Provider  acetaminophen (TYLENOL) 325 MG tablet Take 650 mg by mouth every 6 (six) hours as needed for mild pain.    Yes [provider]  amLODipine (NORVASC) 10 MG tablet Take 1 tablet (10 mg total) by mouth daily. 06/23/20 10/21/20 Yes Erline Hau, MD  budesonide (ENTOCORT EC) 3 MG 24 hr capsule TAKE 3 CAPSULES BY MOUTH ONCE DAILY Patient taking differently: Take 3 mg by mouth daily.  04/05/20  Yes [name], [name], MD  buPROPion [facility] SR) 100 MG 12 hr tablet Take 2 tablets by mouth once daily Patient taking differently: Take 200 mg by mouth daily.  07/04/20  Yes [name], [name], MD  calcium  carbonate (TUMS - DOSED IN MG ELEMENTAL CALCIUM) 500 MG chewable tablet Chew 1 tablet by mouth 3 (three) times daily as needed for indigestion or heartburn.   Yes [provider]  cholecalciferol (VITAMIN D3) 25 MCG (1000 UT) tablet Take 1,000 Units by mouth daily.    Yes [provider]  cycloSPORINE (RESTASIS) 0.05 % ophthalmic emulsion Place 1 drop into both eyes 2 (two) times daily.   Yes [provider]  dexlansoprazole (DEXILANT) 60 MG capsule Take 1 capsule (60 mg total) by mouth daily. 06/28/20  Yes [name], [name], MD  Dextran 70-Hypromellose 0.1-0.3 % SOLN Place 1 drop into both eyes 4 (four) times daily.   Yes [provider]  diphenoxylate-atropine (LOMOTIL) 2.5-0.025 MG tablet Take 1 tablet by mouth 4 (four) times daily as needed for diarrhea or loose stools. 10/17/20  Yes Burchette, Alinda Sierras, MD  DULoxetine (CYMBALTA) 30 MG capsule Take 3 capsules (90 mg total) by mouth daily. 09/13/20  Yes [name], [name], MD  estradiol (ESTRACE) 2 MG tablet Take 1 tablet (2 mg total) by mouth daily. 06/28/20  Yes [name], [name], MD  famotidine (PEPCID) 20 MG tablet Take 20 mg by mouth at bedtime.   Yes [provider]  hydrALAZINE (APRESOLINE) 50 MG tablet Take 1 tablet (50 mg total) by mouth 3 (three) times daily. 06/28/20  Yes [name], [name], MD  HYDROmorphone (DILAUDID) 4 MG tablet Take 4 mg by mouth 4 (four) times daily as needed for moderate pain.  09/15/20  Yes [provider]  lipase/protease/amylase (CREON) 36000 UNITS CPEP capsule Take 1 capsule (36,000 Units total) by mouth 3 (three) times daily as needed (with meals for digestion). 06/28/20  Yes [name], [name], MD  loperamide (IMODIUM) 2 MG capsule TAKE 1 CAPSULE BY MOUTH AS NEEDED FOR DIARRHEA OR  LOOSE  STOOLS Patient taking differently: Take 2 mg by mouth as needed for diarrhea or loose stools.  07/29/20  Yes Erline Hau, MD    methadone (DOLOPHINE) 5 MG tablet Take 1 tablet (5 mg total) by mouth 5 (five) times daily. 06/30/20  Yes Florencia Reasons, MD  metoprolol tartrate (LOPRESSOR) 100 MG tablet Take 1 tablet (100 mg total) by mouth 2 (two) times daily. 06/28/20  Yes [name], [name], MD  Multiple Vitamins-Minerals (MULTIVITAMIN ADULT PO) Take 1 tablet by mouth daily.  Yes [provider]  potassium chloride SA (KLOR-CON) 20 MEQ tablet TAKE TWO TABLETS BY MOUTH ONCE DAILY 09/27/20  Yes [name], [name], MD  PRESCRIPTION MEDICATION Inject 1 each into the vein daily. Home TPN . [facility] . 1 bag for 16 hours. [phone]   Yes [provider]  Probiotic Product (PROBIOTIC-10 PO) Take 1 capsule by mouth daily.    Yes [provider]  promethazine (PHENERGAN) 25 MG tablet TAKE 1 TABLET BY MOUTH EVERY 6 HOURS AS NEEDED FOR NAUSEA AND VOMITING 10/07/20  Yes [name], [name], MD  sucralfate (CARAFATE) 1 GM/10ML suspension Take 10 mLs (1 g total) by mouth 4 (four) times daily -  with meals and at bedtime. 06/28/20  Yes [name], [name], MD  Trace Minerals Cu-Mn-Se-Zn (TRALEMENT IV) Inject 1 mL into the vein. Used in hydration bag 4 times weekly   Yes [provider]  traZODone (DESYREL) 100 MG tablet Take 2 tablets (200 mg total) by mouth at bedtime as needed for sleep. 09/13/20  Yes [name], [name], MD  vitamin B-12 (CYANOCOBALAMIN) 100 MCG tablet Take 1,000 mcg by mouth daily.    Yes [provider]  NARCAN 4 MG/0.1ML LIQD nasal spray kit Place 1 spray into the nose once.  10/14/20   [provider]  senna-docusate (SENOKOT-S) 8.6-50 MG tablet Take 1 tablet by mouth 2 (two) times daily. Patient not taking: Reported on 10/21/2020 08/12/20   Eugenie Filler, MD  sodium chloride 0.9 % infusion Inject into the vein. 10/16/20   [provider]    Allergies    Cefepime, Gabapentin, Hyoscyamine,  Lyrica [pregabalin], Meperidine, Topamax [topiramate], Zosyn [piperacillin sod-tazobactam so], Fentanyl, and Morphine and related  Review of Systems   Review of Systems  Constitutional: Positive for activity change.  Gastrointestinal: Positive for abdominal pain.  All other systems reviewed and are negative.   Physical Exam Updated Vital Signs BP (!) 185/72 (BP Location: Left Arm)   Pulse (!) 42   Temp 98.7 F (37.1 C)   Resp 19   SpO2 98%   Physical Exam  ED Results / Procedures / Treatments   Labs (all labs ordered are listed, but only abnormal results are displayed) Labs Reviewed  COMPREHENSIVE METABOLIC PANEL - Abnormal; Notable for the following components:      Result Value   Glucose, Bld 126 (*)    BUN 34 (*)    Creatinine, Ser 1.05 (*)    AST 76 (*)    ALT 159 (*)    Alkaline Phosphatase 235 (*)    All other components within normal limits  CBC WITH DIFFERENTIAL/PLATELET - Abnormal; Notable for the following components:   RBC 3.41 (*)    Hemoglobin 10.0 (*)    HCT 31.3 (*)    All other components within normal limits  RESP PANEL BY RT-PCR (FLU A&B, COVID) ARPGX2  URINALYSIS, ROUTINE W REFLEX MICROSCOPIC  HIV ANTIBODY (ROUTINE TESTING W REFLEX)    EKG None  Radiology CT ABDOMEN PELVIS WO CONTRAST  Result Date: 10/21/2020 CLINICAL DATA:  Abdominal distension history of bowel obstruction and surgeries EXAM: CT ABDOMEN AND PELVIS WITHOUT CONTRAST TECHNIQUE: Multidetector CT imaging of the abdomen and pelvis was performed following the standard protocol without IV contrast. COMPARISON:  09/07/2020 FINDINGS: Lower chest: No acute abnormality. Hepatobiliary: Stable appearance of the liver. Pneumobilia is present. Cholecystectomy. Pancreas: Stable mild dilatation of the main pancreatic duct. Spleen: Unremarkable. Adrenals/Urinary  Tract: Adrenals, kidneys, and bladder are unremarkable. Stomach/Bowel: Stomach is within normal limits. Evidence of prior bowel resections  involving large and small bowel. Mild dilatation of several small bowel loops in the upper and lower abdomen without apparent transition point. Degree of dilatation is less than the October study. Vascular/Lymphatic: Mild aortic atherosclerosis. Right femoral central venous catheter extends to the intrahepatic IVC. No enlarged lymph nodes identified. Reproductive: Status post hysterectomy. No adnexal masses. Other: No ascites.  No new abnormality of the abdominal wall. Musculoskeletal: No acute osseous abnormality. IMPRESSION: Mild dilatation of several small bowel loops in the upper and lower abdomen without apparent transition point. There is suboptimal evaluation in the absence of contrast. May reflect low-grade obstruction. Degree of dilatation is less than the 09/07/2020 study. Electronically Signed   By: Macy Mis M.D.   On: 10/21/2020 09:16    Procedures Procedures (including critical care time)  Medications Ordered in ED Medications  promethazine (PHENERGAN) injection 25 mg (25 mg Intravenous Given 10/21/20 0839)  methadone (DOLOPHINE) tablet 5 mg (has no administration in time range)  enoxaparin (LOVENOX) injection 40 mg (has no administration in time range)  HYDROmorphone (DILAUDID) injection 1 mg (has no administration in time range)  lactated ringers infusion (has no administration in time range)  acetaminophen (TYLENOL) tablet 650 mg (has no administration in time range)    Or  acetaminophen (TYLENOL) suppository 650 mg (has no administration in time range)  sodium chloride flush (NS) 0.9 % injection 3 mL (has no administration in time range)  metoprolol tartrate (LOPRESSOR) injection 5 mg (has no administration in time range)  hydrALAZINE (APRESOLINE) injection 10 mg (has no administration in time range)  HYDROmorphone (DILAUDID) injection 1 mg (1 mg Intravenous Given 10/21/20 1236)  HYDROmorphone (DILAUDID) injection 1 mg (1 mg Intravenous Given 10/21/20 1001)  promethazine  (PHENERGAN) injection 12.5 mg (12.5 mg Intravenous Given 10/21/20 1236)    ED Course  I have reviewed the triage vital signs and the nursing notes.  Pertinent labs & imaging results that were available during my care of the patient were reviewed by me and considered in my medical decision making (see chart for details).  Clinical Course as of Oct 21 1532  Fri Oct 21, 2020  1531 CT scan showing multiple dilated loops.  Spoke with Dr. Johney Maine, he wants GI to also see the patient.  Hospitalist will admit.  They will consult GI.  CT ABDOMEN PELVIS WO CONTRAST [AN]  4492 Dr. Johney Maine informed me that patient normally gets her GI care at [facility].  He thinks patient would be better served by being admitted at [facility].  Our team has already carried forward with the admission.  I just got off of the phone with [facility].  Dr. Alric Seton, hospitalist will accept the patient.  However there are no current beds at [facility] therefore patient is put on hold.  Dr. Neysa Bonito has been made aware of this development.   [AN]    Clinical Course User Index [AN] Varney Biles, MD   MDM Rules/Calculators/A&P                         Patient comes in a chief complaint of nausea, vomiting, abdominal distention and pain.  She has history of Crohn's disease with multiple surgical complications and short gut syndrome.  She has a PICC line in place for TPN and has had bacteremia in the past.  She reports that her symptoms started getting worse 2  or 3 days ago.  She had some [place] yesterday and her symptoms progressed since then.  Patient thinks that she might be having small obstruction as she had presented similarly last month with a diagnosis.    Final Clinical Impression(s) / ED Diagnoses Final diagnoses:  SBO (small bowel obstruction) ([name])  Intractable vomiting with nausea, unspecified vomiting type  Crohn's disease with complication, unspecified gastrointestinal tract location [facility])  Short gut syndrome    Rx / DC  Orders ED Discharge Orders    None       Varney Biles, MD 10/21/20 1533

## 2020-10-21 NOTE — Consult Note (Signed)
Caitlin Peters  1961/09/24 [id number]  CARE TEAM:  PCP: [name], [name], MD  Outpatient Care Team: Patient Care Team: [name], [name], MD as PCP - General (Internal Medicine) Viona Gilmore, [facility] as Pharmacist (Pharmacist) Brayton Layman, MD as Referring Physician (Gastroenterology) Ted Mcalpine, MD as Consulting Physician (Internal Medicine)  Inpatient Treatment Team: Treatment Team: Attending Provider: Harold Hedge, MD; Rounding Team: Jackelyn Knife, MD; Utilization Review: Orlean Bradford, RN; Consulting Physician: Nolon Nations, MD; Consulting Physician: Mignon Pine, DO; Registered Nurse: Donnella Bi, RN; Consulting Physician: Lavena Bullion, DO   This patient is a 59 y.o.female who presents today for surgical evaluation at the request of Dr Kathrynn Humble.   Chief complaint / Reason for evaluation: Nausea vomiting in the setting of severe Crohn's disease and Gerkin syndrome  Caitlin Peters is a 59year old female with a history of Crohn's disease diagnosed in 27 (age 65), history of long-term steroid use with osteoporosis, S/P 6 bowel resections (1443-1540) complicated by chronic back and abdominal pain, steatorrhea and short bowel syndrome. 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 The patient has been followed by [facility] gastroenterology most recently since 2012 and was found to have a mild stricture at the ileocolic anastomosis requiring dilation. Subsequent scopes have been negative for active disease or strictures, however patient was hospitalized locally at the end of 2014 with diverticulitis and in June 2015 with an abdominal infection requiring antibiotics. She has maximized medical and nutritional attempts to wean off PN and is referred by Dr.  Radford Pax for intestinal transplant evaluation On 01/22/18 Pt underwentEx lap, lysis of adhesions, STEP procedure (21 staplings/cuts), open cholecystectomy, omentectomy. Anatomy at end of procedure: 165cm small bowel anastomosed to transverse colon, continuous to rectum and anus.  Has had issues with nausea vomiting abdominal pain.  Sludge choledocholithiasis status post ERCP 2020  Patient more recently relocated [facility]. She has been struggling with repeated line infections. Followed by infectious disease. Also followed by gastrology. Intermittent nausea vomiting. Our group is being consulted a few times. Prior to his work to establish with [facility] with gastroenterology. They are working trying to adjust her immunosuppression. Seems to be focused on Entocort. She has chronic pain issues on methadone. Perhaps also used to help with the diarrhea that would result from short gut syndrome. She notes sometimes she has good weeks where she can eat pretty well. However she is never been able to wean off parenteral nutrition since 2006 as far as I can gather. She had considered for small bowel transplant but due to financial and family support issues was deemed not a good candidate at the time. Therefore underwent step procedure in the hopes of trying to lengthen small intestine. Unfortunately, not able to come off TNA.  Patient discharged a month ago. Starting establish [facility]. Had worsening nausea vomiting. CAT scan showing dilated bowel. Actually improved compared to when she was admitted last month. However based on concerns recommendation made for medical admission with surgical and gastroenterolology consultations. Apparently patient is on the waiting list for perhaps transferring to [facility] as well.    Assessment  Caitlin Peters  59 y.o. 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 Mainly managed up at [facility] but more recently established with [facility] given her relocation to [facility]:  IV fluids.  Nasogastric tube already in place. Will start small bowel protocol with Gastrografin to get a sense of the anatomy.  There is no need for emergency surgery. I think should she require some abdominal operation, would be best served at a major academic referral center. She is established at [facility] which has a reputation for managing complex inflammatory bowel disease and even short gut syndrome patients. She falls in this category. She notes her last operation 2019 with STP small bowel enteroplasty's took over 8 hours with massive adhesions.  Has been through a lot in her life and felt compelled to relate her past history and concerns and needs. Very appreciative for a listening ear and is trying to be optimistic that she may be a candidate to reconsider small bowel transplant or other efforts to get her Crohn's under control, minimize line sepsis, possibly wean off parenteral nutrition.  Patient with pain. Hard to know if she is withdrawing from her methadone or not. Reasonable to do IV pain medication for now.  -VTE prophylaxis- SCDs, etc -mobilize as tolerated to help recovery  75 minutes spent in review, evaluation, examination, counseling, and coordination of care.  More than 50% of that time was spent in counseling.  Not much to add from surgery standpoint.  Defer to medicine and gastroenterology but ultimately to [facility] gastroenterology for more  immediate and long-term plans.  Surgery will follow more peripherally  Adin Hector, MD, FACS, MASCRS Gastrointestinal and Minimally Invasive Surgery  [facility] [address] [employer], [place] [address] [phone] Fax [phone] Main/Paging  CONTACT INFORMATION: Weekday (9AM-5PM) concerns: Call [facility] main office at [phone] Weeknight (5PM-9AM) or Weekend/Holiday concerns: Check [email or web address] for General Surgery [facility] coverage (Please, do not use SecureChat as it is not reliable communication to operating surgeons for immediate patient care)      10/21/2020      Past Medical History:  Diagnosis Date  . Acute pancreatitis 04/13/2020  . Anasarca 10/2019  . AVN (avascular necrosis of bone) ([place])   . Cataract   . Chronic pain syndrome   . CKD (chronic kidney disease), stage III ([place])   . Crohn disease ([place])   . Crohn disease ([place])   . Depression   . Diverticulosis   . GERD (gastroesophageal reflux disease)   . HTN (hypertension)   . IDA (iron deficiency anemia)   . Malnutrition ([place])   . Mass in chest   . Osteoporosis   . Pancreatitis   . Short gut syndrome   . Vitamin B12 deficiency     Past Surgical History:  Procedure Laterality Date  . ABDOMINAL ADHESION SURGERY  01/22/2018  . APPENDECTOMY  1989  . BILIARY DILATION  11/26/2019   Procedure: BILIARY DILATION;  Surgeon: Jackquline Denmark, MD;  Location: [facility];  Service: Endoscopy;;  . BILIARY DILATION  03/08/2020  Procedure: BILIARY DILATION;  Surgeon: Rush Landmark Telford Nab., MD;  Location: [place];  Service: Gastroenterology;;  . BIOPSY  03/08/2020   Procedure: BIOPSY;  Surgeon: Irving Copas., MD;  Location: [place];  Service: Gastroenterology;;  . CHEST WALL RESECTION     right thoracotomy,resection of chest mass with anterior rib and reconstruction using prosthetic mesh and video arthroscopy  . CHOLECYSTECTOMY  01/22/2018  . COLONOSCOPY  2019  .  ENTEROSTOMY CLOSURE  04/1999  . ERCP N/A 11/26/2019   Procedure: ENDOSCOPIC RETROGRADE CHOLANGIOPANCREATOGRAPHY (ERCP);  Surgeon: Jackquline Denmark, MD;  Location: [facility];  Service: Endoscopy;  Laterality: N/A;  . ERCP N/A 03/08/2020   Procedure: ENDOSCOPIC RETROGRADE CHOLANGIOPANCREATOGRAPHY (ERCP);  Surgeon: Irving Copas., MD;  Location: [place];  Service: Gastroenterology;  Laterality: N/A;  . ESOPHAGOGASTRODUODENOSCOPY N/A 03/08/2020   Procedure: ESOPHAGOGASTRODUODENOSCOPY (EGD);  Surgeon: Irving Copas., MD;  Location: [place];  Service: Gastroenterology;  Laterality: N/A;  . EUS N/A 03/08/2020   Procedure: UPPER ENDOSCOPIC ULTRASOUND (EUS) LINEAR;  Surgeon: Irving Copas., MD;  Location: [place];  Service: Gastroenterology;  Laterality: N/A;  . ILEOCECETOMY  03/1999   ileocolon resection with abdominal stoma  . ILEOSTOMY CLOSURE  2001  . IR FLUORO GUIDE CV LINE LEFT  01/07/2020  . IR FLUORO GUIDE CV LINE LEFT  03/09/2020  . IR FLUORO GUIDE CV LINE LEFT  05/09/2020  . IR FLUORO GUIDE CV LINE LEFT  07/20/2020  . IR FLUORO GUIDE CV LINE RIGHT  08/05/2020  . IR PTA VENOUS EXCEPT DIALYSIS CIRCUIT  01/07/2020  . IR REMOVAL TUN CV CATH W/O FL  08/05/2020  . IR US GUIDE VASC ACCESS LEFT     x 2 06/17/19 and 09/14/2019  . IR US GUIDE VASC ACCESS RIGHT  08/05/2020  . KNEE SURGERY     right knee   . LAPAROSCOPIC SMALL BOWEL RESECTION  2009   2000-2009.  SB resections for Crohns Disease - now with Short gut  . OMENTECTOMY  01/22/2018  . PARTIAL HYSTERECTOMY  1984   with LSO  . REMOVAL OF STONES  11/26/2019   Procedure: REMOVAL OF STONES;  Surgeon: Jackquline Denmark, MD;  [facility];  Service: Endoscopy;;  . REMOVAL OF STONES  03/08/2020   Procedure: REMOVAL OF STONES;  Surgeon: Irving Copas., MD;  Location: [place];  Service: Gastroenterology;;  . SALPINGOOPHORECTOMY Left 1984  . SALPINGOOPHORECTOMY Right 1990  . SERIAL TRANSVERSE  ENTEROPLASTY (STEP) - SMALL BOWEL LENGTHENING  01/22/2018   Dr Alene Mires, [place] length from 120 to 165cm   . SMALL INTESTINE SURGERY  2002  . SMALL INTESTINE SURGERY  2003  . SPHINCTEROTOMY  11/26/2019   Procedure: SPHINCTEROTOMY;  Surgeon: Jackquline Denmark, MD;  [facility]: [facility];  Service: Endoscopy;;  . TOTAL ABDOMINAL HYSTERECTOMY  1990   with RSO  . UPPER GASTROINTESTINAL ENDOSCOPY      Social History   Socioeconomic History  . Marital status: Single    Spouse name: Not on file  . Number of children: Not on file  . Years of education: Not on file  . Highest education level: Not on file  Occupational History  . Occupation: disabled  Tobacco Use  . Smoking status: Former Research scientist (life sciences)  . Smokeless tobacco: Never Used  Vaping Use  . Vaping Use: Never used  Substance and Sexual Activity  . Alcohol use: Not Currently  . Drug use: Never  . Sexual activity: Not on file  Other Topics Concern  .  Not on file  Social History Narrative  . Not on file   Social Determinants of Health   Financial Resource Strain: Low Risk   . Difficulty of Paying Living Expenses: Not hard at all  Food Insecurity:   . Worried About Charity fundraiser in the Last Year: Not on file  . Ran Out of Food in the Last Year: Not on file  Transportation Needs: No Transportation Needs  . Lack of Transportation (Medical): No  . Lack of Transportation (Non-Medical): No  Physical Activity:   . Days of Exercise per Week: Not on file  . Minutes of Exercise per Session: Not on file  Stress:   . Feeling of Stress : Not on file  Social Connections:   . Frequency of Communication with Friends and Family: Not on file  . Frequency of Social Gatherings with Friends and Family: Not on file  . Attends Religious Services: Not on file  . Active Member of Clubs or Organizations: Not on file  . Attends Archivist Meetings: Not on file  . Marital Status: Not on file  Intimate Partner Violence:    . Fear of Current or Ex-Partner: Not on file  . Emotionally Abused: Not on file  . Physically Abused: Not on file  . Sexually Abused: Not on file    Family History  Problem Relation Age of Onset  . Breast cancer Sister   . Multiple sclerosis Sister   . Diabetes Sister   . Lupus Sister   . Colon cancer Other   . Crohn's disease Other   . Seizures Mother   . Glaucoma Mother   . CAD Father   . Heart disease Father   . Hypertension Father     Current Facility-Administered Medications  Medication Dose Route Frequency Provider Last Rate Last Admin  . acetaminophen (TYLENOL) tablet 650 mg  650 mg Oral Q6H PRN Harold Hedge, MD       Or  . acetaminophen (TYLENOL) suppository 650 mg  650 mg Rectal Q6H PRN Harold Hedge, MD      . alum & mag hydroxide-simeth (MAALOX/MYLANTA) 200-200-20 MG/5ML suspension 30 mL  30 mL Oral Q6H PRN Michael Boston, MD      . diatrizoate meglumine-sodium (GASTROGRAFIN) 66-10 % solution 90 mL  90 mL Per NG tube Once Michael Boston, MD      . diphenhydrAMINE (BENADRYL) injection 12.5-25 mg  12.5-25 mg Intravenous Q6H PRN Michael Boston, MD      . enoxaparin (LOVENOX) injection 40 mg  40 mg Subcutaneous Q24H Marva Panda E, MD      . hydrALAZINE (APRESOLINE) injection 10 mg  10 mg Intravenous Q8H PRN Harold Hedge, MD      . HYDROmorphone (DILAUDID) injection 0.5-1 mg  0.5-1 mg Intravenous Q2H PRN Michael Boston, MD   1 mg at 10/21/20 1629  . lactated ringers infusion   Intravenous Continuous Harold Hedge, MD 75 mL/hr at 10/21/20 1629 New Bag at 10/21/20 1629  . lip balm (CARMEX) ointment 1 application  1 application Topical BID Michael Boston, MD      . magic mouthwash  15 mL Oral QID PRN Michael Boston, MD      . menthol-cetylpyridinium (CEPACOL) lozenge 3 mg  1 lozenge Oral PRN Michael Boston, MD      . methocarbamol (ROBAXIN) 1,000 mg in dextrose 5 % 100 mL IVPB  1,000 mg Intravenous Q6H PRN Michael Boston, MD      . metoprolol  tartrate (LOPRESSOR) injection 5  mg  5 mg Intravenous Q6H Harold Hedge, MD      . naloxone [facility]) injection 0.4 mg  0.4 mg Intravenous Once PRN Harold Hedge, MD      . phenol (CHLORASEPTIC) mouth spray 2 spray  2 spray Mouth/Throat PRN Michael Boston, MD      . sodium chloride flush (NS) 0.9 % injection 3 mL  3 mL Intravenous Q12H Harold Hedge, MD         Allergies  Allergen Reactions  . Cefepime Other (See Comments)    Neurotoxicity occurring in setting of AKI. Ceftriaxone tolerated during same admit  . Gabapentin Other (See Comments)    unknown  . Hyoscyamine Hives and Swelling    Legs swelling   Disorientation  . Lyrica [Pregabalin] Other (See Comments)    unknown  . Meperidine Hives    Other reaction(s): GI Upset Due to [place]   . Topamax [Topiramate] Other (See Comments)    unknown  . Zosyn [Piperacillin Sod-Tazobactam So]     Patient reports it makes her vomit, her neck stiff, and her "heart feel funny"  . Fentanyl Rash    Pt is allergic to fentanyl patch related to the glue (gives her a rash) Pt states she is NOT allergic to fentanyl IV medicine  . Morphine And Related Rash    ROS:   All other systems reviewed & are negative except per HPI or as noted below: Constitutional:  No fevers, chills, sweats.  Weight stable Eyes:  No vision changes, No discharge HENT:  No sore throats, nasal drainage Lymph: No neck swelling, No bruising easily Pulmonary:  No cough, productive sputum CV: No orthopnea, PND  Patient walks 15 minutes without difficulty.  No exertional chest/neck/shoulder/arm pain. GI:  No personal nor family history of GI/colon cancer, allergy such as Celiac Sprue, dietary/dairy problems, colitis, ulcers nor gastritis.  No recent sick contacts/gastroenteritis.  No travel outside the country.  No changes in diet. Renal: No UTIs, No hematuria.  CKD 3 Genital:  No drainage, bleeding, masses Musculoskeletal: No severe joint pain.  Good ROM major joints Skin:  No sores or lesions.  No  rashes Heme/Lymph:  No easy bleeding.  No swollen lymph nodes Neuro: No focal weakness/numbness.  No seizures Psych: No suicidal ideation.  No hallucinations  BP (!) 185/72 (BP Location: Left Arm)   Pulse (!) 42   Temp 98.7 F (37.1 C)   Resp 19   SpO2 98%   Physical Exam: Constitutional: Not cachectic.  Hygeine adequate.  Vitals signs as above.   Eyes: Pupils reactive, normal extraocular movements. Sclera nonicteric Neuro: CN II-XII intact.  No major focal sensory defects.  No major motor deficits. Lymph: No head/neck/groin lymphadenopathy Psych:  No severe agitation.  No severe anxiety.  Talkative but pleasant.   Judgment & insight Adequate, Oriented x4, HENT: Normocephalic, Mucus membranes moist.  No thrush.   Neck: Supple, No tracheal deviation.  No obvious thyromegaly Chest: No pain to chest wall compression.  Good respiratory excursion.  No audible wheezing CV:  Pulses intact.  Regular rhythm.  No major extremity edema Abdomen:  Soft.  Mildly distended.  Mild discomfart difuse/vague. No incarcerated hernias.  No hepatomegaly.  No splenomegaly Gen:  No inguinal hernias.  No inguinal lymphadenopathy.   Ext: No obvious deformity or contracture no significant edema.  No cyanosis Skin: No major subcutaneous nodules.  Warm and dry Musculoskeletal: Severe joint rigidity not present.  No obvious  clubbing.  No digital petechiae.     Results:   Labs: Results for orders placed or performed during the hospital encounter of 10/21/20 (from the past 48 hour(s))  Comprehensive metabolic panel     Status: Abnormal   Collection Time: 10/21/20  8:00 AM  Result Value Ref Range   Sodium 139 135 - 145 mmol/L   Potassium 3.7 3.5 - 5.1 mmol/L   Chloride 107 98 - 111 mmol/L   CO2 23 22 - 32 mmol/L   Glucose, Bld 126 (H) 70 - 99 mg/dL    Comment: Glucose reference range applies only to samples taken after fasting for at least 8 hours.   BUN 34 (H) 6 - 20 mg/dL   Creatinine, Ser 1.05 (H) 0.44 -  1.00 mg/dL   Calcium 9.3 8.9 - 10.3 mg/dL   Total Protein 7.3 6.5 - 8.1 g/dL   Albumin 3.5 3.5 - 5.0 g/dL   AST 76 (H) 15 - 41 U/L   ALT 159 (H) 0 - 44 U/L   Alkaline Phosphatase 235 (H) 38 - 126 U/L   Total Bilirubin 0.7 0.3 - 1.2 mg/dL   GFR, Estimated >60 >60 mL/min    Comment: (NOTE) Calculated using the CKD-EPI Creatinine Equation (2021)    Anion gap 9 5 - 15    Comment: Performed at [facility], [place] [address] [place], [place] [address]  CBC with Differential     Status: Abnormal   Collection Time: 10/21/20  8:00 AM  Result Value Ref Range   WBC 4.1 4.0 - 10.5 K/uL   RBC 3.41 (L) 3.87 - 5.11 MIL/uL   Hemoglobin 10.0 (L) 12.0 - 15.0 g/dL   HCT 31.3 (L) 36 - 46 %   MCV 91.8 80.0 - 100.0 fL   MCH 29.3 26.0 - 34.0 pg   MCHC 31.9 30.0 - 36.0 g/dL   RDW 14.6 11.5 - 15.5 %   Platelets 171 150 - 400 K/uL   nRBC 0.0 0.0 - 0.2 %   Neutrophils Relative % 62 %   Neutro Abs 2.6 1.7 - 7.7 K/uL   Lymphocytes Relative 31 %   Lymphs Abs 1.3 0.7 - 4.0 K/uL   Monocytes Relative 6 %   Monocytes Absolute 0.2 0.1 - 1.0 K/uL   Eosinophils Relative 1 %   Eosinophils Absolute 0.0 0.0 - 0.5 K/uL   Basophils Relative 0 %   Basophils Absolute 0.0 0.0 - 0.1 K/uL   Immature Granulocytes 0 %   Abs Immature Granulocytes 0.01 0.00 - 0.07 K/uL    Comment: Performed at [facility], [place] [address] [place], [place] [address]  Prealbumin     Status: None   Collection Time: 10/21/20  8:00 AM  Result Value Ref Range   Prealbumin 31.1 18 - 38 mg/dL    Comment: Performed at [facility], [place] [address], [place], [place] [address]  Resp Panel by RT-PCR (Flu A&B, Covid) Nasopharyngeal Swab     Status: None   Collection Time: 10/21/20  9:56 AM   Specimen: Nasopharyngeal Swab; Nasopharyngeal(NP) swabs in vial transport medium  Result Value Ref Range   SARS Coronavirus 2 by RT PCR NEGATIVE NEGATIVE    Comment: (NOTE) SARS-CoV-2 target nucleic  acids are NOT DETECTED.  The SARS-CoV-2 RNA is generally detectable in upper respiratory specimens during the acute phase of infection. The lowest concentration of SARS-CoV-2 viral copies this assay can detect is 138 copies/mL. A negative result does  not preclude SARS-Cov-2 infection and should not be used as the sole basis for treatment or other patient management decisions. A negative result may occur with  improper specimen collection/handling, submission of specimen other than nasopharyngeal swab, presence of viral mutation(s) within the areas targeted by this assay, and inadequate number of viral copies(<138 copies/mL). A negative result must be combined with clinical observations, patient history, and epidemiological information. The expected result is Negative.  Fact Sheet for Patients:  EntrepreneurPulse.com.au  Fact Sheet for Healthcare Providers:  IncredibleEmployment.be  This test is no t yet approved or cleared by the [place] FDA and  has been authorized for detection and/or diagnosis of SARS-CoV-2 by FDA under an Emergency Use Authorization (EUA). This EUA will remain  in effect (meaning this test can be used) for the duration of the COVID-19 declaration under Section 564(b)(1) of the Act, 21 U.S.C.section 360bbb-3(b)(1), unless the authorization is terminated  or revoked sooner.       Influenza A by PCR NEGATIVE NEGATIVE   Influenza B by PCR NEGATIVE NEGATIVE    Comment: (NOTE) The Xpert Xpress SARS-CoV-2/FLU/RSV plus assay is intended as an aid in the diagnosis of influenza from Nasopharyngeal swab specimens and should not be used as a sole basis for treatment. Nasal washings and aspirates are unacceptable for Xpert Xpress SARS-CoV-2/FLU/RSV testing.  Fact Sheet for Patients: EntrepreneurPulse.com.au  Fact Sheet for Healthcare Providers: IncredibleEmployment.be  This test is not  yet approved or cleared by the [place] FDA and has been authorized for detection and/or diagnosis of SARS-CoV-2 by FDA under an Emergency Use Authorization (EUA). This EUA will remain in effect (meaning this test can be used) for the duration of the COVID-19 declaration under Section 564(b)(1) of the Act, 21 U.S.C. section 360bbb-3(b)(1), unless the authorization is terminated or revoked.  Performed at [facility], [place] [address] [place], [place] [address]     Imaging / Studies: CT ABDOMEN PELVIS WO CONTRAST  Result Date: 10/21/2020 CLINICAL DATA:  Abdominal distension history of bowel obstruction and surgeries EXAM: CT ABDOMEN AND PELVIS WITHOUT CONTRAST TECHNIQUE: Multidetector CT imaging of the abdomen and pelvis was performed following the standard protocol without IV contrast. COMPARISON:  09/07/2020 FINDINGS: Lower chest: No acute abnormality. Hepatobiliary: Stable appearance of the liver. Pneumobilia is present. Cholecystectomy. Pancreas: Stable mild dilatation of the main pancreatic duct. Spleen: Unremarkable. Adrenals/Urinary Tract: Adrenals, kidneys, and bladder are unremarkable. Stomach/Bowel: Stomach is within normal limits. Evidence of prior bowel resections involving large and small bowel. Mild dilatation of several small bowel loops in the upper and lower abdomen without apparent transition point. Degree of dilatation is less than the October study. Vascular/Lymphatic: Mild aortic atherosclerosis. Right femoral central venous catheter extends to the intrahepatic IVC. No enlarged lymph nodes identified. Reproductive: Status post hysterectomy. No adnexal masses. Other: No ascites.  No new abnormality of the abdominal wall. Musculoskeletal: No acute osseous abnormality. IMPRESSION: Mild dilatation of several small bowel loops in the upper and lower abdomen without apparent transition point. There is suboptimal evaluation in the absence of contrast. May reflect  low-grade obstruction. Degree of dilatation is less than the 09/07/2020 study. Electronically Signed   By: Macy Mis M.D.   On: 10/21/2020 09:16    Medications / Allergies: per chart  Antibiotics: Anti-infectives (From admission, onward)   None        Note: Portions of this report may have been transcribed using voice recognition software. Every effort was made to ensure accuracy; however, inadvertent computerized  transcription errors may be present.   Any transcriptional errors that result from this process are unintentional.    Adin Hector, MD, FACS, MASCRS Gastrointestinal and Minimally Invasive Surgery  [facility] [address], [place], [place] [address] [phone] Fax [phone] Main/Paging  CONTACT INFORMATION: Weekday (9AM-5PM) concerns: Call [facility] main office at [phone] Weeknight (5PM-9AM) or Weekend/Holiday concerns: Check [email or web address] for General Surgery [facility] coverage (Please, do not use SecureChat as it is not reliable communication to operating surgeons for immediate patient care)      10/21/2020  4:34 PM

## 2020-10-21 NOTE — ED Notes (Addendum)
Report called to [place]

## 2020-10-22 ENCOUNTER — Other Ambulatory Visit: Payer: Self-pay

## 2020-10-22 ENCOUNTER — Inpatient Hospital Stay (HOSPITAL_COMMUNITY): Payer: Medicare Other

## 2020-10-22 DIAGNOSIS — Z90711 Acquired absence of uterus with remaining cervical stump: Secondary | ICD-10-CM | POA: Diagnosis not present

## 2020-10-22 DIAGNOSIS — K56609 Unspecified intestinal obstruction, unspecified as to partial versus complete obstruction: Secondary | ICD-10-CM | POA: Diagnosis not present

## 2020-10-22 DIAGNOSIS — Z832 Family history of diseases of the blood and blood-forming organs and certain disorders involving the immune mechanism: Secondary | ICD-10-CM | POA: Diagnosis not present

## 2020-10-22 DIAGNOSIS — D509 Iron deficiency anemia, unspecified: Secondary | ICD-10-CM | POA: Diagnosis not present

## 2020-10-22 DIAGNOSIS — R109 Unspecified abdominal pain: Secondary | ICD-10-CM | POA: Diagnosis not present

## 2020-10-22 DIAGNOSIS — F1120 Opioid dependence, uncomplicated: Secondary | ICD-10-CM | POA: Diagnosis not present

## 2020-10-22 DIAGNOSIS — Z9049 Acquired absence of other specified parts of digestive tract: Secondary | ICD-10-CM | POA: Diagnosis not present

## 2020-10-22 DIAGNOSIS — G894 Chronic pain syndrome: Secondary | ICD-10-CM | POA: Diagnosis not present

## 2020-10-22 DIAGNOSIS — Z79899 Other long term (current) drug therapy: Secondary | ICD-10-CM | POA: Diagnosis not present

## 2020-10-22 DIAGNOSIS — Z888 Allergy status to other drugs, medicaments and biological substances status: Secondary | ICD-10-CM | POA: Diagnosis not present

## 2020-10-22 DIAGNOSIS — E46 Unspecified protein-calorie malnutrition: Secondary | ICD-10-CM | POA: Diagnosis not present

## 2020-10-22 DIAGNOSIS — K912 Postsurgical malabsorption, not elsewhere classified: Secondary | ICD-10-CM | POA: Diagnosis not present

## 2020-10-22 DIAGNOSIS — Z833 Family history of diabetes mellitus: Secondary | ICD-10-CM | POA: Diagnosis not present

## 2020-10-22 DIAGNOSIS — Z885 Allergy status to narcotic agent status: Secondary | ICD-10-CM | POA: Diagnosis not present

## 2020-10-22 DIAGNOSIS — K5790 Diverticulosis of intestine, part unspecified, without perforation or abscess without bleeding: Secondary | ICD-10-CM | POA: Diagnosis not present

## 2020-10-22 DIAGNOSIS — Z87891 Personal history of nicotine dependence: Secondary | ICD-10-CM | POA: Diagnosis not present

## 2020-10-22 DIAGNOSIS — E43 Unspecified severe protein-calorie malnutrition: Secondary | ICD-10-CM | POA: Diagnosis not present

## 2020-10-22 DIAGNOSIS — Z83511 Family history of glaucoma: Secondary | ICD-10-CM | POA: Diagnosis not present

## 2020-10-22 DIAGNOSIS — I129 Hypertensive chronic kidney disease with stage 1 through stage 4 chronic kidney disease, or unspecified chronic kidney disease: Secondary | ICD-10-CM | POA: Diagnosis not present

## 2020-10-22 DIAGNOSIS — N1832 Chronic kidney disease, stage 3b: Secondary | ICD-10-CM | POA: Diagnosis not present

## 2020-10-22 DIAGNOSIS — Z20822 Contact with and (suspected) exposure to covid-19: Secondary | ICD-10-CM | POA: Diagnosis not present

## 2020-10-22 DIAGNOSIS — K219 Gastro-esophageal reflux disease without esophagitis: Secondary | ICD-10-CM | POA: Diagnosis not present

## 2020-10-22 DIAGNOSIS — F32A Depression, unspecified: Secondary | ICD-10-CM | POA: Diagnosis not present

## 2020-10-22 DIAGNOSIS — K5080 Crohn's disease of both small and large intestine without complications: Secondary | ICD-10-CM | POA: Diagnosis not present

## 2020-10-22 DIAGNOSIS — E538 Deficiency of other specified B group vitamins: Secondary | ICD-10-CM | POA: Diagnosis not present

## 2020-10-22 LAB — BASIC METABOLIC PANEL
Anion gap: 10 (ref 5–15)
BUN: 30 mg/dL — ABNORMAL HIGH (ref 6–20)
CO2: 25 mmol/L (ref 22–32)
Calcium: 9.4 mg/dL (ref 8.9–10.3)
Chloride: 107 mmol/L (ref 98–111)
Creatinine, Ser: 1.31 mg/dL — ABNORMAL HIGH (ref 0.44–1.00)
GFR, Estimated: 47 mL/min — ABNORMAL LOW (ref >60)
Glucose, Bld: 91 mg/dL (ref 70–99)
Potassium: 3.4 mmol/L — ABNORMAL LOW (ref 3.5–5.1)
Sodium: 142 mmol/L (ref 135–145)

## 2020-10-22 LAB — CBC
HCT: 28.5 % — ABNORMAL LOW (ref 36.0–46.0)
Hemoglobin: 9.3 g/dL — ABNORMAL LOW (ref 12.0–15.0)
MCH: 30 pg (ref 26.0–34.0)
MCHC: 32.6 g/dL (ref 30.0–36.0)
MCV: 91.9 fL (ref 80.0–100.0)
Platelets: 170 10*3/uL (ref 150–400)
RBC: 3.1 MIL/uL — ABNORMAL LOW (ref 3.87–5.11)
RDW: 14.9 % (ref 11.5–15.5)
WBC: 5.9 10*3/uL (ref 4.0–10.5)
nRBC: 0 % (ref 0.0–0.2)

## 2020-10-22 LAB — HIV ANTIBODY (ROUTINE TESTING W REFLEX): HIV Screen 4th Generation wRfx: NONREACTIVE

## 2020-10-22 MED ORDER — SODIUM CHLORIDE 0.9% FLUSH: 10.0000 mL | INTRAVENOUS | Status: DC | PRN

## 2020-10-22 MED ORDER — CHLORHEXIDINE GLUCONATE CLOTH 2 % EX PADS: 6.0000 | MEDICATED_PAD | Freq: Every day | CUTANEOUS | Status: DC

## 2020-10-22 MED ORDER — SODIUM CHLORIDE 0.9% FLUSH: 10.0000 mL | Freq: Two times a day (BID) | INTRAVENOUS | Status: DC

## 2020-10-22 NOTE — Progress Notes (Signed)
Pt has room available at [facility], Room 1209. Report called to [phone]. [facility] will transport pt as [facility] is not able to. Pt has scheduled imaging between 0300-0400 and transport will arrive after.  Receiving unit number [phone]

## 2020-10-22 NOTE — Progress Notes (Signed)
Patient is enroute to [facility] via [facility] with two attendants, with her belongings. Gave report to [facility] RN earlier this morning. Patient left with [facility] at 9:05am today.

## 2020-10-22 NOTE — Progress Notes (Signed)
Patient ID: Caitlin Peters, female   DOB: 1961/02/05, 59 y.o.   MRN: [id number]   [facility] here to transfer the patient to [facility]  Small bowel protocol films show no obstruction with contrast making it into distended colon  NG to stay in for transport

## 2020-10-22 NOTE — Discharge Summary (Signed)
Physician Discharge Summary  Caitlin Peters IWO:[id number] DOB: Aug 01, 1961   PCP: [name], [name], MD  Admit date: 10/21/2020 Discharge date: 10/22/2020 Length of Stay: 1 days   Code Status: Full Code  Admitted From:  Home Discharged to:  [facility] as a transfer  Recommendations    -Consider MRCP at [place]?  Hospital Summary  This is a 59 year old female with past medical history of Crohn's disease s/p multiple surgeries and is a patient of Dr. 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  Says she has been unable to keep any food down since that time but has been tolerating her medications.  Patient gave herself some IV fluids and TPN day prior to admission.  She was given fentanyl and Zofran in route via EMS.   ED Course: Afebrile, hemodynamically stable, on room air. Notable Labs: BUN 34, creatinine 1.05, alkaline phosphatase 235, AST 76, ALT 159, Hb 10.0, influenza and Covid negative. Notable Imaging: CT abdomen pelvis concerning for low-grade SBO. Patient received Dilaudid, Phenergan.  General surgery was consulted by the ED and an NG tube was placed.  Patient was noted to be well known to [facility]. General surgery recommended transfer to [facility] and GI consult. [facility] recommended continued NG tube, phenergan PRN, consider restarting TPN and possible MRCP once she gets to [facility].   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 Nausea and vomiting concern for partial SBO with History of recurrent SBO, multiple abdominal surgeries and Crohn's disease a. N.p.o. b. Transfer to [facility]  2. Chronic pain a. On methadone and Dilaudid outpatient  3. Crohn's disease s/p multiple surgeries and short gut syndrome a. Gets TPN through right groin PICC line, monitor for line infections  4. CKD 3B, stable  5. Hypertension  1. Per [facility]     Consultants   General Surgery  GI  Procedures   NG tube  Antibiotics   Anti-infectives (From admission, onward)   None       Subjective  Patient was transferred prior to being seen by the hospitalist this morning. No overnight events  Objective   Discharge Exam: Vitals:   10/22/20 0820 10/22/20 0844  BP: (!) 167/88 (!) 153/73  Pulse: 76 77  Resp: 18 16  Temp: 98.1 F (36.7 C) 100.2 F (37.9 C)  SpO2: 97% 100%   Vitals:   10/22/20 0251 10/22/20 0612 10/22/20 0820 10/22/20 0844  BP: (!) 177/90 (!) 167/88 (!) 167/88 (!) 153/73  Pulse: 66 76 76 77  Resp: 16  18 16   Temp: 98.1 F (36.7 C)  98.1 F (36.7 C) 100.2 F (37.9 C)  TempSrc: Oral     SpO2: 100%  97% 100%  Weight:  Height:        Physical Exam Not done   The results of significant diagnostics from this hospitalization (including imaging, microbiology, ancillary and laboratory) are listed below for reference.     Microbiology: Recent Results (from the past 240 hour(s))  Resp Panel by RT-PCR (Flu A&B, Covid) Nasopharyngeal Swab     Status: None   Collection Time: 10/21/20  9:56 AM   Specimen: Nasopharyngeal Swab; Nasopharyngeal(NP) swabs in vial transport medium  Result Value Ref Range Status   SARS Coronavirus 2 by RT PCR NEGATIVE NEGATIVE Final    Comment: (NOTE) SARS-CoV-2  target nucleic acids are NOT DETECTED.  The SARS-CoV-2 RNA is generally detectable in upper respiratory specimens during the acute phase of infection. The lowest concentration of SARS-CoV-2 viral copies this assay can detect is 138 copies/mL. A negative result does not preclude SARS-Cov-2 infection and should not be used as the sole basis for treatment or other patient management decisions. A negative result may occur with  improper specimen collection/handling, submission of specimen other than nasopharyngeal swab, presence of viral mutation(s) within the areas targeted by this assay, and inadequate number of viral copies(<138 copies/mL). A negative result must be combined with clinical observations, patient history, and epidemiological information. The expected result is Negative.  Fact Sheet for Patients:  EntrepreneurPulse.com.au  Fact Sheet for Healthcare Providers:  IncredibleEmployment.be  This test is no t yet approved or cleared by the [place] FDA and  has been authorized for detection and/or diagnosis of SARS-CoV-2 by FDA under an Emergency Use Authorization (EUA). This EUA will remain  in effect (meaning this test can be used) for the duration of the COVID-19 declaration under Section 564(b)(1) of the Act, 21 U.S.C.section 360bbb-3(b)(1), unless the authorization is terminated  or revoked sooner.       Influenza A by PCR NEGATIVE NEGATIVE Final   Influenza B by PCR NEGATIVE NEGATIVE Final    Comment: (NOTE) The Xpert Xpress SARS-CoV-2/FLU/RSV plus assay is intended as an aid in the diagnosis of influenza from Nasopharyngeal swab specimens and should not be used as a sole basis for treatment. Nasal washings and aspirates are unacceptable for Xpert Xpress SARS-CoV-2/FLU/RSV testing.  Fact Sheet for Patients: EntrepreneurPulse.com.au  Fact Sheet for Healthcare  Providers: IncredibleEmployment.be  This test is not yet approved or cleared by the [place] FDA and has been authorized for detection and/or diagnosis of SARS-CoV-2 by FDA under an Emergency Use Authorization (EUA). This EUA will remain in effect (meaning this test can be used) for the duration of the COVID-19 declaration under Section 564(b)(1) of the Act, 21 U.S.C. section 360bbb-3(b)(1), unless the authorization is terminated or revoked.  Performed at [facility], [place] [address] [place], [place] [address]      Labs: BNP (last 3 results) Recent Labs    10/28/19 0435 11/26/19 0209  BNP 162.4* 765.4*   Basic Metabolic Panel: Recent Labs  Lab 10/21/20 0800 10/22/20 0442  NA 139 142  K 3.7 3.4*  CL 107 107  CO2 23 25  GLUCOSE 126* 91  BUN 34* 30*  CREATININE 1.05* 1.31*  CALCIUM 9.3 9.4   Liver Function Tests: Recent Labs  Lab 10/21/20 0800  AST 76*  ALT 159*  ALKPHOS 235*  BILITOT 0.7  PROT 7.3  ALBUMIN 3.5   No results for input(s): LIPASE, AMYLASE in the last 168 hours. No results for input(s): AMMONIA in the last 168 hours. CBC: Recent Labs  Lab 10/21/20 0800 10/22/20 0442  WBC 4.1 5.9  NEUTROABS 2.6  --   HGB 10.0* 9.3*  HCT 31.3* 28.5*  MCV 91.8 91.9  PLT 171 170   Cardiac Enzymes: No results for input(s): CKTOTAL, CKMB, CKMBINDEX, TROPONINI in the last 168 hours. BNP: Invalid input(s): POCBNP CBG: No results for input(s): GLUCAP in the last 168 hours. D-Dimer No results for input(s): DDIMER in the last 72 hours. Hgb A1c No results for input(s): HGBA1C in the last 72 hours. Lipid Profile No results for input(s): CHOL, HDL, LDLCALC, TRIG, CHOLHDL, LDLDIRECT in the last 72 hours. Thyroid function studies No results for input(s): TSH, T4TOTAL, T3FREE, THYROIDAB in the last 72 hours.  Invalid input(s): FREET3 Anemia work up No results for input(s): VITAMINB12, FOLATE, FERRITIN, TIBC, IRON,  RETICCTPCT in the last 72 hours. Urinalysis    Component Value Date/Time   COLORURINE YELLOW 08/31/2020 0330   APPEARANCEUR CLEAR 08/31/2020 0330   LABSPEC 1.041 (H) 08/31/2020 0330   PHURINE 6.0 08/31/2020 0330   GLUCOSEU NEGATIVE 08/31/2020 0330   HGBUR NEGATIVE 08/31/2020 0330   BILIRUBINUR NEGATIVE 08/31/2020 0330   KETONESUR NEGATIVE 08/31/2020 0330   PROTEINUR 30 (A) 08/31/2020 0330   NITRITE NEGATIVE 08/31/2020 0330   LEUKOCYTESUR NEGATIVE 08/31/2020 0330   Sepsis Labs Invalid input(s): PROCALCITONIN,  WBC,  LACTICIDVEN Microbiology Recent Results (from the past 240 hour(s))  Resp Panel by RT-PCR (Flu A&B, Covid) Nasopharyngeal Swab     Status: None   Collection Time: 10/21/20  9:56 AM   Specimen: Nasopharyngeal Swab; Nasopharyngeal(NP) swabs in vial transport medium  Result Value Ref Range Status   SARS Coronavirus 2 by RT PCR NEGATIVE NEGATIVE Final    Comment: (NOTE) SARS-CoV-2 target nucleic acids are NOT DETECTED.  The SARS-CoV-2 RNA is generally detectable in upper respiratory specimens during the acute phase of infection. The lowest concentration of SARS-CoV-2 viral copies this assay can detect is 138 copies/mL. A negative result does not preclude SARS-Cov-2 infection and should not be used as the sole basis for treatment or other patient management decisions. A negative result may occur with  improper specimen collection/handling, submission of specimen other than nasopharyngeal swab, presence of viral mutation(s) within the areas targeted by this assay, and inadequate number of viral copies(<138 copies/mL). A negative result must be combined with clinical observations, patient history, and epidemiological information. The expected result is Negative.  Fact Sheet for Patients:  EntrepreneurPulse.com.au  Fact Sheet for Healthcare Providers:  IncredibleEmployment.be  This test is no t yet approved or cleared by the [place] FDA and  has been authorized for detection and/or diagnosis of SARS-CoV-2 by FDA under an Emergency Use Authorization (EUA). This EUA will remain  in effect (meaning this test can be used) for the duration of the COVID-19 declaration under Section 564(b)(1) of the Act, 21 U.S.C.section 360bbb-3(b)(1), unless the authorization is terminated  or revoked sooner.       Influenza A by PCR NEGATIVE NEGATIVE Final   Influenza B by PCR NEGATIVE NEGATIVE Final    Comment: (NOTE) The Xpert Xpress SARS-CoV-2/FLU/RSV plus assay is intended as an aid in the diagnosis of influenza from Nasopharyngeal swab specimens and should not be used as a sole basis for treatment. Nasal washings and aspirates are unacceptable for Xpert Xpress SARS-CoV-2/FLU/RSV testing.  Fact Sheet for Patients: EntrepreneurPulse.com.au  Fact Sheet for Healthcare Providers: IncredibleEmployment.be  This test is not yet approved or cleared by the [place] FDA and has been authorized for detection and/or diagnosis of SARS-CoV-2 by FDA under  an Emergency Use Authorization (EUA). This EUA will remain in effect (meaning this test can be used) for the duration of the COVID-19 declaration under Section 564(b)(1) of the Act, 21 U.S.C. section 360bbb-3(b)(1), unless the authorization is terminated or revoked.  Performed at [facility], [place] [address] [place], [place] [address]     Discharge Instructions     Discharge Instructions    Diet NPO time specified   Complete by: As directed    Increase activity slowly   Complete by: As directed      Allergies as of 10/22/2020      Reactions   Cefepime Other (See Comments)   Neurotoxicity occurring in setting of AKI. Ceftriaxone tolerated during same admit   Gabapentin Other (See Comments)   unknown   Hyoscyamine Hives, Swelling   Legs swelling Disorientation   Lyrica [pregabalin] Other (See Comments)    unknown   Meperidine Hives   Other reaction(s): GI Upset Due to Chrones   Topamax [topiramate] Other (See Comments)   unknown   Zosyn [piperacillin Sod-tazobactam So]    Patient reports it makes her vomit, her neck stiff, and her "heart feel funny"   Fentanyl Rash   Pt is allergic to fentanyl patch related to the glue (gives her a rash) Pt states she is NOT allergic to fentanyl IV medicine   Morphine And Related Rash      Medication List    TAKE these medications   acetaminophen 325 MG tablet Commonly known as: TYLENOL Take 650 mg by mouth every 6 (six) hours as needed for mild pain.   amLODipine 10 MG tablet Commonly known as: NORVASC Take 1 tablet (10 mg total) by mouth daily.   budesonide 3 MG 24 hr capsule Commonly known as: ENTOCORT EC TAKE 3 CAPSULES BY MOUTH ONCE DAILY What changed: how much to take   buPROPion 100 MG 12 hr tablet Commonly known as: WELLBUTRIN SR Take 2 tablets by mouth once daily   calcium carbonate 500 MG chewable tablet Commonly known as: TUMS - dosed in mg elemental calcium Chew 1 tablet by mouth 3 (three) times daily as needed for indigestion or heartburn.   cholecalciferol 25 MCG (1000 UNIT) tablet Commonly known as: VITAMIN D3 Take 1,000 Units by mouth daily.   cycloSPORINE 0.05 % ophthalmic emulsion Commonly known as: RESTASIS Place 1 drop into both eyes 2 (two) times daily.   Dexilant 60 MG capsule Generic drug: dexlansoprazole Take 1 capsule (60 mg total) by mouth daily.   Dextran 70-Hypromellose 0.1-0.3 % Soln Place 1 drop into both eyes 4 (four) times daily.   diphenoxylate-atropine 2.5-0.025 MG tablet Commonly known as: LOMOTIL Take 1 tablet by mouth 4 (four) times daily as needed for diarrhea or loose stools.   DULoxetine 30 MG capsule Commonly known as: CYMBALTA Take 3 capsules (90 mg total) by mouth daily.   estradiol 2 MG tablet Commonly known as: ESTRACE Take 1 tablet (2 mg total) by mouth daily.    famotidine 20 MG tablet Commonly known as: PEPCID Take 20 mg by mouth at bedtime.   hydrALAZINE 50 MG tablet Commonly known as: APRESOLINE Take 1 tablet (50 mg total) by mouth 3 (three) times daily.   HYDROmorphone 4 MG tablet Commonly known as: DILAUDID Take 4 mg by mouth 4 (four) times daily as needed for moderate pain.   lipase/protease/amylase 36000 UNITS Cpep capsule Commonly known as: Creon Take 1 capsule (36,000 Units total) by mouth 3 (three) times daily as  needed (with meals for digestion).   loperamide 2 MG capsule Commonly known as: IMODIUM TAKE 1 CAPSULE BY MOUTH AS NEEDED FOR DIARRHEA OR  LOOSE  STOOLS What changed:   how much to take  how to take this  when to take this  reasons to take this  additional instructions   methadone 5 MG tablet Commonly known as: DOLOPHINE Take 1 tablet (5 mg total) by mouth 5 (five) times daily.   metoprolol tartrate 100 MG tablet Commonly known as: LOPRESSOR Take 1 tablet (100 mg total) by mouth 2 (two) times daily.   MULTIVITAMIN ADULT PO Take 1 tablet by mouth daily.   Narcan 4 MG/0.1ML Liqd nasal spray kit Generic drug: naloxone Place 1 spray into the nose once.   potassium chloride SA 20 MEQ tablet Commonly known as: KLOR-CON TAKE TWO TABLETS BY MOUTH ONCE DAILY   PRESCRIPTION MEDICATION Inject 1 each into the vein daily. Home TPN . [facility] . 1 bag for 16 hours. [phone]   PROBIOTIC-10 PO Take 1 capsule by mouth daily.   promethazine 25 MG tablet Commonly known as: PHENERGAN TAKE 1 TABLET BY MOUTH EVERY 6 HOURS AS NEEDED FOR NAUSEA AND VOMITING   senna-docusate 8.6-50 MG tablet Commonly known as: Senokot-S Take 1 tablet by mouth 2 (two) times daily.   sodium chloride 0.9 % infusion Inject into the vein.   sucralfate 1 GM/10ML suspension Commonly known as: Carafate Take 10 mLs (1 g total) by mouth 4 (four) times daily -  with meals and at bedtime.   TRALEMENT  IV Inject 1 mL into the vein. Used in hydration bag 4 times weekly   traZODone 100 MG tablet Commonly known as: DESYREL Take 2 tablets (200 mg total) by mouth at bedtime as needed for sleep.   vitamin B-12 100 MCG tablet Commonly known as: CYANOCOBALAMIN Take 1,000 mcg by mouth daily.       Allergies  Allergen Reactions   Cefepime Other (See Comments)    Neurotoxicity occurring in setting of AKI. Ceftriaxone tolerated during same admit   Gabapentin Other (See Comments)    unknown   Hyoscyamine Hives and Swelling    Legs swelling   Disorientation   Lyrica [Pregabalin] Other (See Comments)    unknown   Meperidine Hives    Other reaction(s): GI Upset Due to Chrones    Topamax [Topiramate] Other (See Comments)    unknown   Zosyn [Piperacillin Sod-Tazobactam So]     Patient reports it makes her vomit, her neck stiff, and her "heart feel funny"   Fentanyl Rash    Pt is allergic to fentanyl patch related to the glue (gives her a rash) Pt states she is NOT allergic to fentanyl IV medicine   Morphine And Related Rash    Dispo: The patient is from: Home              Anticipated d/c is to: [facility]             Time coordinating discharge: Under 30 minutes   SIGNED:   Harold Hedge, D.O. [facility] Pager: [phone]  10/22/2020, 9:25 AM

## 2020-10-23 LAB — MITOCHONDRIAL ANTIBODIES: Mitochondrial M2 Ab, IgG: <20 Units (ref 0.0–20.0)

## 2020-10-25 NOTE — Patient Instructions (Signed)
Visit Information  Goals Addressed            This Visit's Progress   . Patient Caitlin Peters (see longitudinal plan of care for additional care plan information)  Current Barriers:  . Chronic Disease Management support, education, and care coordination needs related to Hypertension, GERD, Chronic Kidney Disease, Depression, Osteoporosis and Chronic Pain, Chron's Disease    Hypertension BP Readings from Last 3 Encounters:  10/22/20 (!) 153/73  10/05/20 135/82  09/19/20 (!) 211/75   . Pharmacist Clinical Goal(s): o Over the next 90 days, patient will work with PharmD and providers to achieve BP goal <140/90 . Current regimen:  . Amlodipine 10 mg daily  . Hydralazine 50 mg three times daily  . Metoprolol tartrate 100 mg twice daily  . Interventions: o Discussed low salt diet and exercising as tolerated extensively . Patient self care activities - Over the next 90 days, patient will: o Check blood pressure weekly, document, and provide at future appointments o Ensure daily salt intake < 2300 mg/day Osteoporosis . Pharmacist Clinical Goal(s) o Over the next 90 days, patient will work with PharmD and providers to maintain bone health and prevent fractures  . Current regimen:  o Vitamin D3 1000 units daily  o Tums 500 mg three times daily as needed o Reclast 5 mg injection every 12 months . Interventions: o Recommend rechecking DEXA scan  . Patient self care activities - Over the next 90 days, patient will: o Maintain [phone] units of vitamin D daily o Maintain 1200 mg of calcium daily from dietary and supplemental sources.  Medication management . Pharmacist Clinical Goal(s): o Over the next 90 days, patient will work with PharmD and providers to achieve optimal medication adherence . Current pharmacy: [facility] . Interventions o Comprehensive medication review performed. o Switch back to [employer]  . Patient self care activities - Over  the next 90 days, patient will: o Take medications as prescribed o Report any questions or concerns to PharmD and/or provider(s)       The patient verbalized understanding of instructions, educational materials, and care plan provided today and declined offer to receive copy of patient instructions, educational materials, and care plan.   Telephone follow up appointment with pharmacy team member scheduled for: 3 months

## 2020-10-27 ENCOUNTER — Ambulatory Visit: Payer: Medicare Other | Admitting: Internal Medicine

## 2020-10-27 NOTE — Progress Notes (Deleted)
Cardiology Office Note:    Date:  10/27/2020   ID:  [name], DOB 1961-06-01, MRN [id number]  PCP:  [name], [name], MD  [place] Cardiologist:  No primary care provider on file.  [facility] Electrophysiologist:  None   CC: *** Consulted for the evaluation of cardiac risk stratification prior to transplant at the behest of [name], [name], MD  History of Present Illness:    Caitlin Peters is a 59 y.o. female with a hx of bacteremia without diagnosis of infective endocarditis in the setting of Crohn's Disease and TPN, HTN who presents for evaluation.  Past Medical History:  Diagnosis Date  . Acute pancreatitis 04/13/2020  . Anasarca 10/2019  . AVN (avascular necrosis of bone) ([place])   . Cataract   . Choledocholithiasis (sludge) s/p ERCP 10/2019 10/21/2020  . Chronic pain syndrome   . CKD (chronic kidney disease), stage III ([place])   . Crohn disease ([place])   . Crohn's disease of small & large intestine with SGS 1984   Caitlin Peters is a 59year old female with a history of Crohn's disease diagnosed in 52 (age 16), history of long-term steroid use with osteoporosis, S/P multiple bowel resections ([id number]) complicated by chronic back and abdominal pain, steatorrhea and short bowel syndrome. The patient has been left with ~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 attached to proximal transverse colon through rectum. She has been  . Depression   . Diverticulosis   . GERD (gastroesophageal reflux disease)   . HTN (hypertension)   . IDA (iron deficiency anemia)   . Malnutrition ([place])   . Mass in chest   . Osteoporosis   . Pancreatitis   . 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 Vitamin B12 deficiency     Past  Surgical History:  Procedure Laterality Date  . ABDOMINAL ADHESION SURGERY  01/22/2018  . APPENDECTOMY  1989  . BILIARY DILATION  11/26/2019   Procedure: BILIARY DILATION;  Surgeon: Jackquline Denmark, MD;  Location: [facility];  Service: Endoscopy;;  . BILIARY DILATION  03/08/2020   Procedure: BILIARY DILATION;  Surgeon: Irving Copas., MD;  Location: [place];  Service: Gastroenterology;;  . BIOPSY  03/08/2020   Procedure: BIOPSY;  Surgeon: Irving Copas., MD;  Location: [place];  Service: Gastroenterology;;  . CHEST WALL RESECTION     right thoracotomy,resection of chest mass with anterior rib and reconstruction using prosthetic mesh and video arthroscopy  . CHOLECYSTECTOMY  01/22/2018  . COLONOSCOPY  2019  . ENTEROSTOMY CLOSURE  04/1999  . ERCP N/A 11/26/2019   Procedure: ENDOSCOPIC RETROGRADE CHOLANGIOPANCREATOGRAPHY (ERCP);  Surgeon: Jackquline Denmark, MD;  Location: [facility];  Service: Endoscopy;  Laterality: N/A;  . ERCP N/A 03/08/2020   Procedure: ENDOSCOPIC RETROGRADE CHOLANGIOPANCREATOGRAPHY (ERCP);  Surgeon: Irving Copas., MD;  Location: [place];  Service: Gastroenterology;  Laterality: N/A;  . ESOPHAGOGASTRODUODENOSCOPY N/A 03/08/2020   Procedure: ESOPHAGOGASTRODUODENOSCOPY (EGD);  Surgeon: Irving Copas., MD;  Location: [place];  Service: Gastroenterology;  Laterality: N/A;  . EUS N/A 03/08/2020   Procedure: UPPER ENDOSCOPIC ULTRASOUND (EUS) LINEAR;  Surgeon: Irving Copas., MD;  Location: [place];  Service: Gastroenterology;  Laterality: N/A;  . ILEOCECETOMY  03/1999   ileocolon resection with abdominal stoma  .  ILEOSTOMY CLOSURE  2001  . IR FLUORO GUIDE CV LINE LEFT  01/07/2020  . IR FLUORO GUIDE CV LINE LEFT  03/09/2020  . IR FLUORO GUIDE CV LINE LEFT  05/09/2020  . IR FLUORO GUIDE CV LINE LEFT  07/20/2020  . IR FLUORO GUIDE CV LINE RIGHT  08/05/2020  . IR PTA VENOUS EXCEPT DIALYSIS CIRCUIT  01/07/2020  . IR  REMOVAL TUN CV CATH W/O FL  08/05/2020  . IR US GUIDE VASC ACCESS LEFT     x 2 06/17/19 and 09/14/2019  . IR US GUIDE VASC ACCESS RIGHT  08/05/2020  . KNEE SURGERY     right knee   . LAPAROSCOPIC SMALL BOWEL RESECTION  2009   2000-2009.  SB resections for Crohns Disease - now with Short gut  . OMENTECTOMY  01/22/2018  . PARTIAL HYSTERECTOMY  1984   with LSO  . REMOVAL OF STONES  11/26/2019   Procedure: REMOVAL OF STONES;  Surgeon: Jackquline Denmark, MD;  Location: [facility];  Service: Endoscopy;;  . REMOVAL OF STONES  03/08/2020   Procedure: REMOVAL OF STONES;  Surgeon: Irving Copas., MD;  Location: [place];  Service: Gastroenterology;;  . SALPINGOOPHORECTOMY Left 1984  . SALPINGOOPHORECTOMY Right 1990  . SERIAL TRANSVERSE ENTEROPLASTY (STEP) - SMALL BOWEL LENGTHENING  01/22/2018   Dr Alene Mires, [place] length from 120 to 165cm   . SMALL INTESTINE SURGERY  2002  . SMALL INTESTINE SURGERY  2003  . SPHINCTEROTOMY  11/26/2019   Procedure: SPHINCTEROTOMY;  Surgeon: Jackquline Denmark, MD;  Location: [facility];  Service: Endoscopy;;  . TOTAL ABDOMINAL HYSTERECTOMY  1990   with RSO  . UPPER GASTROINTESTINAL ENDOSCOPY      Current Medications: No outpatient medications have been marked as taking for the 10/27/20 encounter (Appointment) with Werner Lean, MD.     Allergies:   Cefepime, Gabapentin, Hyoscyamine, Lyrica [pregabalin], Meperidine, Topamax [topiramate], Zosyn [piperacillin sod-tazobactam so], Fentanyl, and Morphine and related   Social History   Socioeconomic History  . Marital status: Single    Spouse name: Not on file  . Number of children: Not on file  . Years of education: Not on file  . Highest education level: Not on file  Occupational History  . Occupation: disabled  Tobacco Use  . Smoking status: Former Research scientist (life sciences)  . Smokeless tobacco: Never Used  Vaping Use  . Vaping Use: Never used  Substance and Sexual Activity  . Alcohol use:  Not Currently  . Drug use: Never  . Sexual activity: Not on file  Other Topics Concern  . Not on file  Social History Narrative  . Not on file   Social Determinants of Health   Financial Resource Strain: Low Risk   . Difficulty of Paying Living Expenses: Not hard at all  Food Insecurity:   . Worried About Charity fundraiser in the Last Year: Not on file  . Ran Out of Food in the Last Year: Not on file  Transportation Needs: No Transportation Needs  . Lack of Transportation (Medical): No  . Lack of Transportation (Non-Medical): No  Physical Activity:   . Days of Exercise per Week: Not on file  . Minutes of Exercise per Session: Not on file  Stress:   . Feeling of Stress : Not on file  Social Connections:   . Frequency of Communication with Friends and Family: Not on file  . Frequency of Social Gatherings with Friends and Family: Not on file  .  Attends Religious Services: Not on file  . Active Member of Clubs or Organizations: Not on file  . Attends Archivist Meetings: Not on file  . Marital Status: Not on file     Family History: [name] ***family history includes Breast cancer in her sister; CAD in her father; Colon cancer in an other family member; Crohn's disease in an other family member; Diabetes in her sister; Glaucoma in her mother; Heart disease in her father; Hypertension in her father; Lupus in her sister; Multiple sclerosis in her sister; Seizures in her mother.  ROS:   Please see the history of present illness.    *** All other systems reviewed and are negative.  EKGs/Labs/Other Studies Reviewed:    The following studies were reviewed today: ***  EKG:  EKG is *** ordered today.  The ekg ordered today demonstrates ***  Recent Labs: 11/26/2019: B Natriuretic Peptide 179.1 08/10/2020: TSH 1.070 09/12/2020: Magnesium 2.5 10/21/2020: ALT 159 10/22/2020: BUN 30; Creatinine, Ser 1.31; Hemoglobin 9.3; Platelets 170; Potassium 3.4; Sodium 142   Recent Lipid Panel    Component Value Date/Time   TRIG 55 09/12/2020 0339     Risk Assessment/Calculations:   {Does this patient have ATRIAL FIBRILLATION?:[phone]}   Physical Exam:    VS:  There were no vitals taken for this visit.    Wt Readings from Last 3 Encounters:  10/21/20 134 lb 14.4 oz (61.2 kg)  10/05/20 137 lb 8 oz (62.4 kg)  09/19/20 142 lb (64.4 kg)     GEN: *** Well nourished, well developed in no acute distress HEENT: Normal NECK: No JVD; No carotid bruits LYMPHATICS: No lymphadenopathy CARDIAC: ***RRR, no murmurs, rubs, gallops RESPIRATORY:  Clear to auscultation without rales, wheezing or rhonchi  ABDOMEN: Soft, non-tender, non-distended MUSCULOSKELETAL:  No edema; No deformity  SKIN: Warm and dry NEUROLOGIC:  Alert and oriented x 3 PSYCHIATRIC:  Normal affect   ASSESSMENT:    No diagnosis found. PLAN:    In order of problems listed above:  Preoperative Risk Assessment - The Revised Cardiac Risk Index = ***(high risk surgery (intraperitoneal, intrathoracic, or suprainguinal vascular), CAD, CHF, CVA, DM on insulin, Scr >2), which equates to *** (0=0.4%: very low risk; 1=0.9%: low risk; 2=6.6%: moderate risk; >2=>11%; high risk) estimated risk of perioperative myocardial infarction, pulmonary edema, ventricular fibrillation, cardiac arrest, or complete heart block.  - DASI score of *** associated with *** functional mets - No further cardiac testing is recommended prior to surgery.  - The patient may proceed to surgery at acceptable risk.   - Due to symptoms of ***, an echocardiogram is recommended prior to proceeding with their planned procedure.  - Due to symptoms of ***, a stress test is recommended prior to proceeding with their planned procedure.  - Our service is available as needed in the peri-operative period.    *** Essential Hypertension, Diabetes with hypertension, Obesity/DM/HTN - ambulatory blood pressure ***, will start/continue  ambulatory BP monitoring; gave education on how to perform ambulatory blood pressure monitoring including the frequency and technique; goal ambulatory blood pressure < 135/85 on average - continue home medications with the exception of *** - will get labs in 7-10 days (BMET, Mg***) - OSA Risk ***, Epworth Sleepiness Scale great than *** - concern for secondary HTN, will order renal artery duplex, and Aldo/renin*** - discussed diet (DASH/low sodium), and exercise/weight loss interventions ***     Shared Decision Making/Informed Consent   {Are you ordering a CV Procedure (e.g. stress  test, cath, DCCV, TEE, etc)?   Press F2        :[id number]}    Medication Adjustments/Labs and Tests Ordered: Current medicines are reviewed at length with the patient today.  Concerns regarding medicines are outlined above.  No orders of the defined types were placed in this encounter.  No orders of the defined types were placed in this encounter.   There are no Patient Instructions on file for this visit.   Signed, Werner Lean, MD  10/27/2020 9:06 AM    [place]

## 2020-11-06 ENCOUNTER — Inpatient Hospital Stay (HOSPITAL_COMMUNITY)
Admission: EM | Admit: 2020-11-06 | Discharge: 2020-11-10 | DRG: 315 | Disposition: A | Discharge status: Home Health | Payer: Medicare Other | Attending: Internal Medicine | Admitting: Internal Medicine

## 2020-11-06 ENCOUNTER — Other Ambulatory Visit: Payer: Self-pay

## 2020-11-06 ENCOUNTER — Encounter (HOSPITAL_COMMUNITY): Payer: Self-pay

## 2020-11-06 DIAGNOSIS — Z820 Family history of epilepsy and other diseases of the nervous system: Secondary | ICD-10-CM

## 2020-11-06 DIAGNOSIS — Z79891 Long term (current) use of opiate analgesic: Secondary | ICD-10-CM

## 2020-11-06 DIAGNOSIS — T827XXA Infection and inflammatory reaction due to other cardiac and vascular devices, implants and grafts, initial encounter: Secondary | ICD-10-CM | POA: Diagnosis present

## 2020-11-06 DIAGNOSIS — K912 Postsurgical malabsorption, not elsewhere classified: Secondary | ICD-10-CM | POA: Diagnosis not present

## 2020-11-06 DIAGNOSIS — M810 Age-related osteoporosis without current pathological fracture: Secondary | ICD-10-CM | POA: Diagnosis not present

## 2020-11-06 DIAGNOSIS — Z789 Other specified health status: Secondary | ICD-10-CM | POA: Diagnosis present

## 2020-11-06 DIAGNOSIS — Z83511 Family history of glaucoma: Secondary | ICD-10-CM

## 2020-11-06 DIAGNOSIS — I129 Hypertensive chronic kidney disease with stage 1 through stage 4 chronic kidney disease, or unspecified chronic kidney disease: Secondary | ICD-10-CM | POA: Diagnosis not present

## 2020-11-06 DIAGNOSIS — Z803 Family history of malignant neoplasm of breast: Secondary | ICD-10-CM

## 2020-11-06 DIAGNOSIS — I10 Essential (primary) hypertension: Secondary | ICD-10-CM

## 2020-11-06 DIAGNOSIS — K5080 Crohn's disease of both small and large intestine without complications: Secondary | ICD-10-CM | POA: Diagnosis present

## 2020-11-06 DIAGNOSIS — F32A Depression, unspecified: Secondary | ICD-10-CM | POA: Diagnosis present

## 2020-11-06 DIAGNOSIS — N289 Disorder of kidney and ureter, unspecified: Secondary | ICD-10-CM

## 2020-11-06 DIAGNOSIS — F419 Anxiety disorder, unspecified: Secondary | ICD-10-CM | POA: Diagnosis present

## 2020-11-06 DIAGNOSIS — T80211A Bloodstream infection due to central venous catheter, initial encounter: Secondary | ICD-10-CM | POA: Diagnosis not present

## 2020-11-06 DIAGNOSIS — N1831 Chronic kidney disease, stage 3a: Secondary | ICD-10-CM | POA: Diagnosis not present

## 2020-11-06 DIAGNOSIS — Z888 Allergy status to other drugs, medicaments and biological substances status: Secondary | ICD-10-CM | POA: Diagnosis not present

## 2020-11-06 DIAGNOSIS — K219 Gastro-esophageal reflux disease without esophagitis: Secondary | ICD-10-CM | POA: Diagnosis present

## 2020-11-06 DIAGNOSIS — Z833 Family history of diabetes mellitus: Secondary | ICD-10-CM

## 2020-11-06 DIAGNOSIS — Z79899 Other long term (current) drug therapy: Secondary | ICD-10-CM | POA: Diagnosis not present

## 2020-11-06 DIAGNOSIS — G894 Chronic pain syndrome: Secondary | ICD-10-CM | POA: Diagnosis not present

## 2020-11-06 DIAGNOSIS — D6959 Other secondary thrombocytopenia: Secondary | ICD-10-CM | POA: Diagnosis present

## 2020-11-06 DIAGNOSIS — D631 Anemia in chronic kidney disease: Secondary | ICD-10-CM | POA: Diagnosis present

## 2020-11-06 DIAGNOSIS — Z87891 Personal history of nicotine dependence: Secondary | ICD-10-CM | POA: Diagnosis not present

## 2020-11-06 DIAGNOSIS — R7881 Bacteremia: Secondary | ICD-10-CM

## 2020-11-06 DIAGNOSIS — Z832 Family history of diseases of the blood and blood-forming organs and certain disorders involving the immune mechanism: Secondary | ICD-10-CM | POA: Diagnosis not present

## 2020-11-06 DIAGNOSIS — R509 Fever, unspecified: Secondary | ICD-10-CM

## 2020-11-06 DIAGNOSIS — Z20822 Contact with and (suspected) exposure to covid-19: Secondary | ICD-10-CM | POA: Diagnosis present

## 2020-11-06 DIAGNOSIS — D649 Anemia, unspecified: Secondary | ICD-10-CM

## 2020-11-06 DIAGNOSIS — Z885 Allergy status to narcotic agent status: Secondary | ICD-10-CM | POA: Diagnosis not present

## 2020-11-06 DIAGNOSIS — Z8249 Family history of ischemic heart disease and other diseases of the circulatory system: Secondary | ICD-10-CM | POA: Diagnosis not present

## 2020-11-06 DIAGNOSIS — R6510 Systemic inflammatory response syndrome (SIRS) of non-infectious origin without acute organ dysfunction: Secondary | ICD-10-CM | POA: Diagnosis present

## 2020-11-06 DIAGNOSIS — N1832 Chronic kidney disease, stage 3b: Secondary | ICD-10-CM | POA: Diagnosis present

## 2020-11-06 DIAGNOSIS — N183 Chronic kidney disease, stage 3 unspecified: Secondary | ICD-10-CM | POA: Diagnosis present

## 2020-11-06 MED ORDER — LACTATED RINGERS IV SOLN: INTRAVENOUS | Status: AC

## 2020-11-06 MED ORDER — METRONIDAZOLE 500 MG PO TABS
500.0000 mg | ORAL_TABLET | Freq: Three times a day (TID) | ORAL | Status: DC
  Administered 2020-11-07 (×3): 500 mg via ORAL
  Filled 2020-11-06 (×3): qty 1

## 2020-11-06 MED ORDER — SODIUM CHLORIDE 0.9 % IV SOLN
2.0000 g | Freq: Once | INTRAVENOUS | Status: DC
  Filled 2020-11-06: qty 2

## 2020-11-06 MED ORDER — VANCOMYCIN HCL IN DEXTROSE 1-5 GM/200ML-% IV SOLN
1000.0000 mg | Freq: Once | INTRAVENOUS | Status: DC
  Filled 2020-11-06: qty 200

## 2020-11-06 NOTE — ED Provider Notes (Signed)
[facility] DEPT Provider Note   CSN: [id number] Arrival date & time: 11/06/20  2316   History Chief Complaint  Patient presents with  . Fever    Caitlin Peters is a 59 y.o. female.  The history is provided by the patient.  Fever She has history of hypertension, Crohn's disease with short gut syndrome on chronic TPN, chronic kidney disease and comes in because of fevers for the last 3 days.  Temperatures have been as high as 101.  There have been associated chills but no sweats.  She is concerned because she has had problem with PICC line infections in the past.  There has been no pain at the site of the PICC line.  She has had nausea without vomiting.  There has been no change in chronic diarrhea and no change in chronic body aches.  She denies sore throat or cough.  He has not had any sick contacts.  She has been vaccinated against COVID-19 including a booster vaccination.  Past Medical History:  Diagnosis Date  . Acute pancreatitis 04/13/2020  . Anasarca 10/2019  . AVN (avascular necrosis of bone) ([place])   . Cataract   . Choledocholithiasis (sludge) s/p ERCP 10/2019 10/21/2020  . Chronic pain syndrome   . CKD (chronic kidney disease), stage III ([place])   . Crohn disease ([place])   . Crohn's disease of small & large intestine with SGS 1984   Caitlin Peters is a 59year old female with a history of Crohn's disease diagnosed in [birth year] (age 83), history of long-term steroid use with osteoporosis, S/P multiple bowel resections ([id number]) complicated by chronic back and abdominal pain, steatorrhea and short bowel syndrome. The patient has been left with ~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 attached to proximal transverse colon through rectum. She has been  . Depression   . Diverticulosis   . GERD (gastroesophageal reflux disease)   . HTN (hypertension)   . IDA (iron deficiency anemia)   . Malnutrition ([place])   . Mass in chest   . Osteoporosis   . Pancreatitis   . 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 Vitamin B12 deficiency     Patient Active Problem List   Diagnosis Date Noted  . History of central line-associated bloodstream infection (CLABSI) 10/21/2020  . Nausea & vomiting 10/21/2020  . Choledocholithiasis (sludge) s/p ERCP 10/2019 10/21/2020  . 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 Abdominal pain 09/07/2020  . Acute metabolic encephalopathy   . Hypomagnesemia   . Partial small bowel obstruction ([place])   . Bacteremia associated with intravascular line ([place]) 08/04/2020  . Enterobacter sepsis ([place]) 07/22/2020  . SIRS (systemic inflammatory response syndrome) ([place]) 07/19/2020  . Anxiety 06/03/2020  . Acute pancreatitis 04/13/2020  . Infection due to Stenotrophomonas maltophilia 03/10/2020  . Pancytopenia ([place]) 03/05/2020  . Central line infection   . Elevated liver enzymes 01/02/2020  . Cholangitis   . Anasarca 10/28/2019  . Acute kidney injury superimposed on chronic kidney disease ([place]) 10/28/2019  . Falls 10/28/2019  . Malnutrition ([place])   . Vitamin B12 deficiency   . GERD (gastroesophageal reflux disease)   . Chronic pain syndrome   . Hypokalemia due to excessive gastrointestinal loss of potassium 10/13/2019  . Fever   .  Hypokalemia 10/12/2019  . Chronic diarrhea 10/12/2019  . Dysuria 10/12/2019  . Bilateral lower extremity edema 10/12/2019  . AKI (acute kidney injury) ([place]) 10/12/2019  . Fungemia 08/27/2019  . IDA (iron deficiency anemia) 11/03/2018  . Dilation of biliary tract 08/28/2018  . Severe diarrhea 03/07/2018  . LFTs abnormal 01/27/2018  . GI tract obstruction ([place]) 01/27/2018  . CKD (chronic kidney disease) stage 3, GFR 30-59 ml/min ([name]) 11/30/2017  . Gram-negative  bacteremia 09/13/2017  . HTN (hypertension), benign 12/02/2016  . Intractable pain 12/02/2016  . Anemia 12/02/2016  . 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 Avascular necrosis ([place]) 06/14/2015  . Polyarthralgia 05/03/2015  . On total parenteral nutrition (TPN) 12/30/2014  . Osteoporosis 12/24/2014  . Low back pain 12/16/2014  . Vitamin D deficiency 12/16/2014  . 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 History of colonic diverticulitis 2014  . Depression 07/24/2012  . Gram-positive bacteremia 07/24/2012  . Small bowel obstruction due to adhesions ([place]) 07/24/2012  . Diarrhea 12/13/2011  . Neuralgia and neuritis 06/01/2011  . Myalgia and myositis 08/12/2003  . Crohn's disease of small & large intestine with SGS 1984    Past Surgical History:  Procedure Laterality Date  . ABDOMINAL ADHESION SURGERY  01/22/2018  . APPENDECTOMY  1989  . BILIARY DILATION  11/26/2019   Procedure: BILIARY DILATION;  Surgeon: Jackquline Denmark, MD;  Location: [facility];  Service: Endoscopy;;  . BILIARY DILATION  03/08/2020   Procedure: BILIARY DILATION;  Surgeon: Irving Copas., MD;  Location: [facility];  Service: Gastroenterology;;  . BIOPSY  03/08/2020   Procedure: BIOPSY;  Surgeon: Irving Copas., MD;  Location: [facility];  Service: Gastroenterology;;  . CHEST WALL RESECTION     right thoracotomy,resection of chest mass with anterior rib and reconstruction using prosthetic mesh and video arthroscopy  . CHOLECYSTECTOMY  01/22/2018  . COLONOSCOPY  2019  . ENTEROSTOMY CLOSURE  04/1999  . ERCP N/A 11/26/2019   Procedure: ENDOSCOPIC RETROGRADE CHOLANGIOPANCREATOGRAPHY (ERCP);  Surgeon: Jackquline Denmark, MD;  Location: [facility];  Service: Endoscopy;  Laterality: N/A;  . ERCP N/A 03/08/2020   Procedure: ENDOSCOPIC RETROGRADE CHOLANGIOPANCREATOGRAPHY (ERCP);  Surgeon: Irving Copas., MD;  Location: [facility];  Service: Gastroenterology;  Laterality: N/A;  . ESOPHAGOGASTRODUODENOSCOPY  N/A 03/08/2020   Procedure: ESOPHAGOGASTRODUODENOSCOPY (EGD);  Surgeon: Irving Copas., MD;  Location: [facility];  Service: Gastroenterology;  Laterality: N/A;  . EUS N/A 03/08/2020   Procedure: UPPER ENDOSCOPIC ULTRASOUND (EUS) LINEAR;  Surgeon: Irving Copas., MD;  Location: [facility];  Service: Gastroenterology;  Laterality: N/A;  . ILEOCECETOMY  03/1999   ileocolon resection with abdominal stoma  . ILEOSTOMY CLOSURE  2001  . IR FLUORO GUIDE CV LINE LEFT  01/07/2020  . IR FLUORO GUIDE CV LINE LEFT  03/09/2020  . IR FLUORO GUIDE CV LINE LEFT  05/09/2020  . IR FLUORO GUIDE CV LINE LEFT  07/20/2020  . IR FLUORO GUIDE CV LINE RIGHT  08/05/2020  . IR PTA VENOUS EXCEPT DIALYSIS CIRCUIT  01/07/2020  . IR REMOVAL TUN CV CATH W/O FL  08/05/2020  . IR US GUIDE VASC ACCESS LEFT     x 2 06/17/19 and 09/14/2019  . IR US GUIDE VASC ACCESS RIGHT  08/05/2020  . KNEE SURGERY     right knee   . LAPAROSCOPIC SMALL BOWEL RESECTION  2009   2000-2009.  SB resections for Crohns Disease - now with Short gut  . OMENTECTOMY  01/22/2018  . PARTIAL HYSTERECTOMY  1984   with  LSO  . REMOVAL OF STONES  11/26/2019   Procedure: REMOVAL OF STONES;  Surgeon: Jackquline Denmark, MD;  Location: [facility];  Service: Endoscopy;;  . REMOVAL OF STONES  03/08/2020   Procedure: REMOVAL OF STONES;  Surgeon: Irving Copas., MD;  Location: [facility];  Service: Gastroenterology;;  . SALPINGOOPHORECTOMY Left 1984  . SALPINGOOPHORECTOMY Right 1990  . SERIAL TRANSVERSE ENTEROPLASTY (STEP) - SMALL BOWEL LENGTHENING  01/22/2018   Dr Alene Mires, [place] length from 120 to 165cm   . SMALL INTESTINE SURGERY  2002  . SMALL INTESTINE SURGERY  2003  . SPHINCTEROTOMY  11/26/2019   Procedure: SPHINCTEROTOMY;  Surgeon: Jackquline Denmark, MD;  Location: [facility];  Service: Endoscopy;;  . TOTAL ABDOMINAL HYSTERECTOMY  1990   with RSO  . UPPER GASTROINTESTINAL ENDOSCOPY       OB History   No obstetric  history on file.     Family History  Problem Relation Age of Onset  . Breast cancer Sister   . Multiple sclerosis Sister   . Diabetes Sister   . Lupus Sister   . Colon cancer Other   . Crohn's disease Other   . Seizures Mother   . Glaucoma Mother   . CAD Father   . Heart disease Father   . Hypertension Father     Social History   Tobacco Use  . Smoking status: Former Research scientist (life sciences)  . Smokeless tobacco: Never Used  Vaping Use  . Vaping Use: Never used  Substance Use Topics  . Alcohol use: Not Currently  . Drug use: Never    Home Medications Prior to Admission medications   Medication Sig Start Date End Date Taking? Authorizing Provider  acetaminophen (TYLENOL) 325 MG tablet Take 650 mg by mouth every 6 (six) hours as needed for mild pain.     [provider]  amLODipine (NORVASC) 10 MG tablet Take 1 tablet (10 mg total) by mouth daily. 06/23/20 10/21/20  Erline Hau, MD  budesonide (ENTOCORT EC) 3 MG 24 hr capsule TAKE 3 CAPSULES BY MOUTH ONCE DAILY Patient taking differently: Take 3 mg by mouth daily.  04/05/20   [name], [name], MD  buPROPion [facility] SR) 100 MG 12 hr tablet Take 2 tablets by mouth once daily Patient taking differently: Take 200 mg by mouth daily.  07/04/20   [name], [name], MD  calcium carbonate (TUMS - DOSED IN MG ELEMENTAL CALCIUM) 500 MG chewable tablet Chew 1 tablet by mouth 3 (three) times daily as needed for indigestion or heartburn.    [provider]  cholecalciferol (VITAMIN D3) 25 MCG (1000 UT) tablet Take 1,000 Units by mouth daily.     [provider]  cycloSPORINE (RESTASIS) 0.05 % ophthalmic emulsion Place 1 drop into both eyes 2 (two) times daily.    [provider]  dexlansoprazole (DEXILANT) 60 MG capsule Take 1 capsule (60 mg total) by mouth daily. 06/28/20   [name], [name], MD  Dextran 70-Hypromellose 0.1-0.3 % SOLN Place 1 drop into both eyes 4 (four) times  daily.    [provider]  diphenoxylate-atropine (LOMOTIL) 2.5-0.025 MG tablet Take 1 tablet by mouth 4 (four) times daily as needed for diarrhea or loose stools. 10/17/20   Burchette, Alinda Sierras, MD  DULoxetine (CYMBALTA) 30 MG capsule Take 3 capsules (90 mg total) by mouth daily. 09/13/20   [name], [name], MD  estradiol (ESTRACE) 2 MG tablet Take 1 tablet (2 mg  total) by mouth daily. 06/28/20   [name], [name], MD  famotidine (PEPCID) 20 MG tablet Take 20 mg by mouth at bedtime.    [provider]  hydrALAZINE (APRESOLINE) 50 MG tablet Take 1 tablet (50 mg total) by mouth 3 (three) times daily. 06/28/20   [name], [name], MD  HYDROmorphone (DILAUDID) 4 MG tablet Take 4 mg by mouth 4 (four) times daily as needed for moderate pain.  09/15/20   [provider]  lipase/protease/amylase (CREON) 36000 UNITS CPEP capsule Take 1 capsule (36,000 Units total) by mouth 3 (three) times daily as needed (with meals for digestion). 06/28/20   [name], [name], MD  loperamide (IMODIUM) 2 MG capsule TAKE 1 CAPSULE BY MOUTH AS NEEDED FOR DIARRHEA OR  LOOSE  STOOLS Patient taking differently: Take 2 mg by mouth as needed for diarrhea or loose stools.  07/29/20   [name], [name], MD  methadone (DOLOPHINE) 5 MG tablet Take 1 tablet (5 mg total) by mouth 5 (five) times daily. 06/30/20   Florencia Reasons, MD  metoprolol tartrate (LOPRESSOR) 100 MG tablet Take 1 tablet (100 mg total) by mouth 2 (two) times daily. 06/28/20   [name], [name], MD  Multiple Vitamins-Minerals (MULTIVITAMIN ADULT PO) Take 1 tablet by mouth daily.    [provider]  NARCAN 4 MG/0.1ML LIQD nasal spray kit Place 1 spray into the nose once.  10/14/20   [provider]  potassium chloride SA (KLOR-CON) 20 MEQ tablet TAKE TWO TABLETS BY MOUTH ONCE DAILY 09/27/20   [name], [name], MD  PRESCRIPTION MEDICATION Inject 1 each into the vein daily. Home TPN .  [facility] in [facility] . 1 bag for 16 hours. [phone]    [provider]  Probiotic Product (PROBIOTIC-10 PO) Take 1 capsule by mouth daily.     [provider]  promethazine (PHENERGAN) 25 MG tablet TAKE 1 TABLET BY MOUTH EVERY 6 HOURS AS NEEDED FOR NAUSEA AND VOMITING 10/07/20   [name], [name], MD  senna-docusate (SENOKOT-S) 8.6-50 MG tablet Take 1 tablet by mouth 2 (two) times daily. Patient not taking: Reported on 10/21/2020 08/12/20   Eugenie Filler, MD  sodium chloride 0.9 % infusion Inject into the vein. 10/16/20   [provider]  sucralfate (CARAFATE) 1 GM/10ML suspension Take 10 mLs (1 g total) by mouth 4 (four) times daily -  with meals and at bedtime. 06/28/20   [name], [name], MD  Trace Minerals Cu-Mn-Se-Zn (TRALEMENT IV) Inject 1 mL into the vein. Used in hydration bag 4 times weekly    [provider]  traZODone (DESYREL) 100 MG tablet Take 2 tablets (200 mg total) by mouth at bedtime as needed for sleep. 09/13/20   [name], [name], MD  vitamin B-12 (CYANOCOBALAMIN) 100 MCG tablet Take 1,000 mcg by mouth daily.     [provider]    Allergies    Cefepime, Gabapentin, Hyoscyamine, Lyrica [pregabalin], Meperidine, Topamax [topiramate], Zosyn [piperacillin sod-tazobactam so], Fentanyl, and Morphine and related  Review of Systems   Review of Systems  Constitutional: Positive for fever.  All other systems reviewed and are negative.   Physical Exam Updated Vital Signs BP (!) 158/98 (BP Location: Left Arm)   Pulse 82   Temp (!) 101 F (38.3 C) (Oral)   Resp 16   SpO2 100%   Physical Exam Vitals and nursing note reviewed.   59 year old  female, resting comfortably and in no acute distress. Vital signs are significant for elevated temperature and blood pressure. Oxygen saturation is 100%, which is normal. Head is normocephalic and atraumatic. PERRLA, EOMI. Oropharynx is  clear. Neck is nontender and supple without adenopathy or JVD. Back is nontender and there is no CVA tenderness. Lungs are clear without rales, wheezes, or rhonchi. Chest is nontender. Heart has regular rate and rhythm without murmur. Abdomen is soft, flat, with mild to moderate tenderness diffusely.  There is no rebound or guarding.  Multiple surgical scars are present.  There are no masses or hepatosplenomegaly and peristalsis is normoactive. Extremities have no cyanosis or edema, full range of motion is present.  PICC line present in the right anterior proximal thigh.  PICC line site does not show any erythema or warmth or swelling. Skin is warm and dry without rash. Neurologic: Mental status is normal, cranial nerves are intact, there are no motor or sensory deficits.  ED Results / Procedures / Treatments   Labs (all labs ordered are listed, but only abnormal results are displayed) Labs Reviewed  COMPREHENSIVE METABOLIC PANEL - Abnormal; Notable for the following components:      Result Value   CO2 20 (*)    Glucose, Bld 101 (*)    BUN 22 (*)    Creatinine, Ser 1.29 (*)    Calcium 8.5 (*)    Albumin 3.1 (*)    AST 142 (*)    ALT 287 (*)    Alkaline Phosphatase 195 (*)    GFR, Estimated 48 (*)    All other components within normal limits  CBC WITH DIFFERENTIAL/PLATELET - Abnormal; Notable for the following components:   RBC 2.67 (*)    Hemoglobin 8.2 (*)    HCT 25.0 (*)    Platelets 125 (*)    Abs Immature Granulocytes 0.09 (*)    All other components within normal limits  URINALYSIS, ROUTINE W REFLEX MICROSCOPIC - Abnormal; Notable for the following components:   Color, Urine STRAW (*)    All other components within normal limits  CULTURE, BLOOD (ROUTINE X 2)  CULTURE, BLOOD (ROUTINE X 2)  URINE CULTURE  RESP PANEL BY RT-PCR (FLU A&B, COVID) ARPGX2  LACTIC ACID, PLASMA  PROTIME-INR  APTT    EKG EKG Interpretation  Date/Time:  Monday November 07 2020 00:01:33  EST Ventricular Rate:  74 PR Interval:    QRS Duration: 83 QT Interval:  369 QTC Calculation: 410 R Axis:   69 Text Interpretation: Sinus rhythm Left ventricular hypertrophy When compared with ECG of 10/21/2020, Premature atrial complexes are no longer present Confirmed by Delora Fuel ([id number]) on 11/07/2020 12:04:27 AM   Radiology No results found.  Procedures Procedures  CRITICAL CARE Performed by: Delora Fuel Total critical care time: 35 minutes Critical care time was exclusive of separately billable procedures and treating other patients. Critical care was necessary to treat or prevent imminent or life-threatening deterioration. Critical care was time spent personally by me on the following activities: development of treatment plan with patient and/or surrogate as well as nursing, discussions with consultants, evaluation of patient's response to treatment, examination of patient, obtaining history from patient or surrogate, ordering and performing treatments and interventions, ordering and review of laboratory studies, ordering and review of radiographic studies, pulse oximetry and re-evaluation of patient's condition.  Medications Ordered in ED Medications  lactated ringers infusion ( Intravenous New Bag/Given 11/07/20 0026)  metroNIDAZOLE (FLAGYL) tablet 500 mg (500 mg Oral Given 11/07/20 0517)  ceFEPIme (MAXIPIME) 2 g in sodium chloride 0.9 % 100 mL IVPB (0 g Intravenous Stopped 11/07/20 0136)  vancomycin (VANCOCIN) IVPB 1000 mg/200 mL premix (has no administration in time range)  amLODipine (NORVASC) tablet 10 mg (has no administration in time range)  hydrALAZINE (APRESOLINE) tablet 50 mg (has no administration in time range)  metoprolol tartrate (LOPRESSOR) tablet 100 mg (has no administration in time range)  buPROPion (WELLBUTRIN SR) 12 hr tablet 200 mg (has no administration in time range)  DULoxetine (CYMBALTA) DR capsule 90 mg (has no administration in time range)   traZODone (DESYREL) tablet 200 mg (has no administration in time range)  pantoprazole (PROTONIX) EC tablet 40 mg (has no administration in time range)  diphenoxylate-atropine (LOMOTIL) 2.5-0.025 MG per tablet 1 tablet (has no administration in time range)  famotidine (PEPCID) tablet 20 mg (has no administration in time range)  lipase/protease/amylase (CREON) capsule 36,000 Units (has no administration in time range)  sucralfate (CARAFATE) 1 GM/10ML suspension 1 g (has no administration in time range)  vitamin B-12 (CYANOCOBALAMIN) tablet 1,000 mcg (has no administration in time range)  cholecalciferol (VITAMIN D3) tablet 1,000 Units (has no administration in time range)  multivitamin with minerals tablet 1 tablet (has no administration in time range)  potassium chloride SA (KLOR-CON) CR tablet 40 mEq (has no administration in time range)  promethazine (PHENERGAN) tablet 25 mg (has no administration in time range)  cycloSPORINE (RESTASIS) 0.05 % ophthalmic emulsion 1 drop (has no administration in time range)  polyvinyl alcohol (LIQUIFILM TEARS) 1.4 % ophthalmic solution 1 drop (has no administration in time range)  enoxaparin (LOVENOX) injection 40 mg (has no administration in time range)  acetaminophen (TYLENOL) tablet 650 mg (has no administration in time range)    Or  acetaminophen (TYLENOL) suppository 650 mg (has no administration in time range)  budesonide (ENTOCORT EC) 24 hr capsule 6 mg (has no administration in time range)  HYDROmorphone (DILAUDID) tablet 4 mg (has no administration in time range)  methadone (DOLOPHINE) tablet 5 mg (5 mg Oral Given 11/07/20 0643)  vancomycin (VANCOREADY) IVPB 1250 mg/250 mL (0 mg Intravenous Stopped 11/07/20 0352)  HYDROmorphone (DILAUDID) injection 1 mg (1 mg Intravenous Given 11/07/20 0159)  prochlorperazine (COMPAZINE) injection 10 mg (10 mg Intravenous Given 11/07/20 0159)    ED Course  I have reviewed the triage vital signs and the nursing  notes.  Pertinent labs & imaging results that were available during my care of the patient were reviewed by me and considered in my medical decision making (see chart for details).  MDM Rules/Calculators/A&P Fever inpatient with history of multiple PICC line infections.  Patient does not appear overtly septic today, but septic work-up is initiated, and she is started on antibiotics.  Old records are reviewed, and she has multiple admissions for fever/sepsis/bacteremia with most recent being 08/31/2020.  Labs show stable renal insufficiency, anemia which is slightly worse than baseline.  WBC is normal.  ECG shows no acute changes.  Given her history of PICC line infections, I do not feel it is safe to discharge her until cultures have come back negative.  Case is discussed with Dr. Hal Hope of [facility], who agrees to admit the patient.  Final Clinical Impression(s) / ED Diagnoses Final diagnoses:  Fever, unspecified fever cause  Elevated blood pressure reading with diagnosis of hypertension  Renal insufficiency  Normochromic normocytic anemia    Rx / DC Orders ED Discharge Orders    None       Roxanne Mins,  Shanon Brow, MD 11/07/20 [phone]

## 2020-11-06 NOTE — ED Triage Notes (Signed)
Patient arrived via [facility] with concerns of her picc line being infected over the last three days. Reports running a fever of 101.3, took tylenol 2 hours ago.

## 2020-11-06 NOTE — ED Notes (Signed)
Pt stated she had a line and wanted the blood drawn from it.

## 2020-11-07 ENCOUNTER — Other Ambulatory Visit (HOSPITAL_COMMUNITY): Payer: Medicare Other

## 2020-11-07 ENCOUNTER — Encounter (HOSPITAL_COMMUNITY): Payer: Self-pay | Admitting: Internal Medicine

## 2020-11-07 ENCOUNTER — Observation Stay (HOSPITAL_COMMUNITY): Payer: Medicare Other

## 2020-11-07 DIAGNOSIS — F419 Anxiety disorder, unspecified: Secondary | ICD-10-CM | POA: Diagnosis present

## 2020-11-07 DIAGNOSIS — M810 Age-related osteoporosis without current pathological fracture: Secondary | ICD-10-CM | POA: Diagnosis present

## 2020-11-07 DIAGNOSIS — N289 Disorder of kidney and ureter, unspecified: Secondary | ICD-10-CM | POA: Diagnosis present

## 2020-11-07 DIAGNOSIS — R509 Fever, unspecified: Secondary | ICD-10-CM

## 2020-11-07 DIAGNOSIS — R7881 Bacteremia: Secondary | ICD-10-CM | POA: Diagnosis not present

## 2020-11-07 DIAGNOSIS — T827XXD Infection and inflammatory reaction due to other cardiac and vascular devices, implants and grafts, subsequent encounter: Secondary | ICD-10-CM | POA: Diagnosis not present

## 2020-11-07 DIAGNOSIS — R109 Unspecified abdominal pain: Secondary | ICD-10-CM | POA: Diagnosis present

## 2020-11-07 DIAGNOSIS — I10 Essential (primary) hypertension: Secondary | ICD-10-CM | POA: Diagnosis not present

## 2020-11-07 DIAGNOSIS — Z820 Family history of epilepsy and other diseases of the nervous system: Secondary | ICD-10-CM | POA: Diagnosis not present

## 2020-11-07 DIAGNOSIS — T80211A Bloodstream infection due to central venous catheter, initial encounter: Secondary | ICD-10-CM | POA: Diagnosis present

## 2020-11-07 DIAGNOSIS — B9689 Other specified bacterial agents as the cause of diseases classified elsewhere: Secondary | ICD-10-CM | POA: Diagnosis not present

## 2020-11-07 DIAGNOSIS — I129 Hypertensive chronic kidney disease with stage 1 through stage 4 chronic kidney disease, or unspecified chronic kidney disease: Secondary | ICD-10-CM | POA: Diagnosis present

## 2020-11-07 DIAGNOSIS — Z87891 Personal history of nicotine dependence: Secondary | ICD-10-CM | POA: Diagnosis not present

## 2020-11-07 DIAGNOSIS — D6959 Other secondary thrombocytopenia: Secondary | ICD-10-CM | POA: Diagnosis present

## 2020-11-07 DIAGNOSIS — Z832 Family history of diseases of the blood and blood-forming organs and certain disorders involving the immune mechanism: Secondary | ICD-10-CM | POA: Diagnosis not present

## 2020-11-07 DIAGNOSIS — G894 Chronic pain syndrome: Secondary | ICD-10-CM

## 2020-11-07 DIAGNOSIS — R6510 Systemic inflammatory response syndrome (SIRS) of non-infectious origin without acute organ dysfunction: Secondary | ICD-10-CM

## 2020-11-07 DIAGNOSIS — R7989 Other specified abnormal findings of blood chemistry: Secondary | ICD-10-CM | POA: Diagnosis not present

## 2020-11-07 DIAGNOSIS — D631 Anemia in chronic kidney disease: Secondary | ICD-10-CM | POA: Diagnosis present

## 2020-11-07 DIAGNOSIS — Z888 Allergy status to other drugs, medicaments and biological substances status: Secondary | ICD-10-CM | POA: Diagnosis not present

## 2020-11-07 DIAGNOSIS — K5080 Crohn's disease of both small and large intestine without complications: Secondary | ICD-10-CM | POA: Diagnosis present

## 2020-11-07 DIAGNOSIS — Z9989 Dependence on other enabling machines and devices: Secondary | ICD-10-CM

## 2020-11-07 DIAGNOSIS — Z833 Family history of diabetes mellitus: Secondary | ICD-10-CM | POA: Diagnosis not present

## 2020-11-07 DIAGNOSIS — Z803 Family history of malignant neoplasm of breast: Secondary | ICD-10-CM | POA: Diagnosis not present

## 2020-11-07 DIAGNOSIS — F32A Depression, unspecified: Secondary | ICD-10-CM | POA: Diagnosis present

## 2020-11-07 DIAGNOSIS — N1831 Chronic kidney disease, stage 3a: Secondary | ICD-10-CM | POA: Diagnosis present

## 2020-11-07 DIAGNOSIS — R110 Nausea: Secondary | ICD-10-CM | POA: Diagnosis not present

## 2020-11-07 DIAGNOSIS — Z20822 Contact with and (suspected) exposure to covid-19: Secondary | ICD-10-CM | POA: Diagnosis present

## 2020-11-07 DIAGNOSIS — K219 Gastro-esophageal reflux disease without esophagitis: Secondary | ICD-10-CM | POA: Diagnosis present

## 2020-11-07 DIAGNOSIS — K912 Postsurgical malabsorption, not elsewhere classified: Secondary | ICD-10-CM | POA: Diagnosis present

## 2020-11-07 DIAGNOSIS — N183 Chronic kidney disease, stage 3 unspecified: Secondary | ICD-10-CM

## 2020-11-07 DIAGNOSIS — Z79899 Other long term (current) drug therapy: Secondary | ICD-10-CM | POA: Diagnosis not present

## 2020-11-07 DIAGNOSIS — Z8249 Family history of ischemic heart disease and other diseases of the circulatory system: Secondary | ICD-10-CM | POA: Diagnosis not present

## 2020-11-07 DIAGNOSIS — Z83511 Family history of glaucoma: Secondary | ICD-10-CM | POA: Diagnosis not present

## 2020-11-07 DIAGNOSIS — Z885 Allergy status to narcotic agent status: Secondary | ICD-10-CM | POA: Diagnosis not present

## 2020-11-07 DIAGNOSIS — Z79891 Long term (current) use of opiate analgesic: Secondary | ICD-10-CM | POA: Diagnosis not present

## 2020-11-07 LAB — CBC WITH DIFFERENTIAL/PLATELET
Abs Immature Granulocytes: 0.09 10*3/uL — ABNORMAL HIGH (ref 0.00–0.07)
Abs Immature Granulocytes: 0.02 10*3/uL (ref 0.00–0.07)
Basophils Absolute: 0 10*3/uL (ref 0.0–0.1)
Basophils Absolute: 0 10*3/uL (ref 0.0–0.1)
Basophils Relative: 0 %
Basophils Relative: 0 %
Eosinophils Absolute: 0.1 10*3/uL (ref 0.0–0.5)
Eosinophils Absolute: 0 10*3/uL (ref 0.0–0.5)
Eosinophils Relative: 1 %
Eosinophils Relative: 1 %
HCT: 25 % — ABNORMAL LOW (ref 36.0–46.0)
HCT: 23 % — ABNORMAL LOW (ref 36.0–46.0)
Hemoglobin: 8.2 g/dL — ABNORMAL LOW (ref 12.0–15.0)
Hemoglobin: 7.6 g/dL — ABNORMAL LOW (ref 12.0–15.0)
Immature Granulocytes: 1 %
Immature Granulocytes: 0 %
Lymphocytes Relative: 10 %
Lymphocytes Relative: 13 %
Lymphs Abs: 0.7 10*3/uL (ref 0.7–4.0)
Lymphs Abs: 0.9 10*3/uL (ref 0.7–4.0)
MCH: 30.7 pg (ref 26.0–34.0)
MCH: 30.6 pg (ref 26.0–34.0)
MCHC: 32.8 g/dL (ref 30.0–36.0)
MCHC: 33 g/dL (ref 30.0–36.0)
MCV: 93.6 fL (ref 80.0–100.0)
MCV: 92.7 fL (ref 80.0–100.0)
Monocytes Absolute: 0.4 10*3/uL (ref 0.1–1.0)
Monocytes Absolute: 0.4 10*3/uL (ref 0.1–1.0)
Monocytes Relative: 5 %
Monocytes Relative: 5 %
Neutro Abs: 6 10*3/uL (ref 1.7–7.7)
Neutro Abs: 5.7 10*3/uL (ref 1.7–7.7)
Neutrophils Relative %: 83 %
Neutrophils Relative %: 81 %
Platelets: 125 10*3/uL — ABNORMAL LOW (ref 150–400)
Platelets: 120 10*3/uL — ABNORMAL LOW (ref 150–400)
RBC: 2.67 MIL/uL — ABNORMAL LOW (ref 3.87–5.11)
RBC: 2.48 MIL/uL — ABNORMAL LOW (ref 3.87–5.11)
RDW: 15 % (ref 11.5–15.5)
RDW: 14.9 % (ref 11.5–15.5)
WBC: 7.2 10*3/uL (ref 4.0–10.5)
WBC: 7.1 10*3/uL (ref 4.0–10.5)
nRBC: 0 % (ref 0.0–0.2)
nRBC: 0 % (ref 0.0–0.2)

## 2020-11-07 LAB — GLUCOSE, CAPILLARY: Glucose-Capillary: 88 mg/dL (ref 70–99)

## 2020-11-07 LAB — COMPREHENSIVE METABOLIC PANEL
ALT: 287 U/L — ABNORMAL HIGH (ref 0–44)
ALT: 238 U/L — ABNORMAL HIGH (ref 0–44)
AST: 142 U/L — ABNORMAL HIGH (ref 15–41)
AST: 109 U/L — ABNORMAL HIGH (ref 15–41)
Albumin: 3.1 g/dL — ABNORMAL LOW (ref 3.5–5.0)
Albumin: 2.7 g/dL — ABNORMAL LOW (ref 3.5–5.0)
Alkaline Phosphatase: 195 U/L — ABNORMAL HIGH (ref 38–126)
Alkaline Phosphatase: 170 U/L — ABNORMAL HIGH (ref 38–126)
Anion gap: 8 (ref 5–15)
Anion gap: 9 (ref 5–15)
BUN: 22 mg/dL — ABNORMAL HIGH (ref 6–20)
BUN: 19 mg/dL (ref 6–20)
CO2: 20 mmol/L — ABNORMAL LOW (ref 22–32)
CO2: 19 mmol/L — ABNORMAL LOW (ref 22–32)
Calcium: 8.5 mg/dL — ABNORMAL LOW (ref 8.9–10.3)
Calcium: 8.4 mg/dL — ABNORMAL LOW (ref 8.9–10.3)
Chloride: 109 mmol/L (ref 98–111)
Chloride: 107 mmol/L (ref 98–111)
Creatinine, Ser: 1.29 mg/dL — ABNORMAL HIGH (ref 0.44–1.00)
Creatinine, Ser: 1.12 mg/dL — ABNORMAL HIGH (ref 0.44–1.00)
GFR, Estimated: 48 mL/min — ABNORMAL LOW (ref >60)
GFR, Estimated: 57 mL/min — ABNORMAL LOW (ref >60)
Glucose, Bld: 101 mg/dL — ABNORMAL HIGH (ref 70–99)
Glucose, Bld: 97 mg/dL (ref 70–99)
Potassium: 3.6 mmol/L (ref 3.5–5.1)
Potassium: 3.3 mmol/L — ABNORMAL LOW (ref 3.5–5.1)
Sodium: 137 mmol/L (ref 135–145)
Sodium: 135 mmol/L (ref 135–145)
Total Bilirubin: 0.6 mg/dL (ref 0.3–1.2)
Total Bilirubin: 0.7 mg/dL (ref 0.3–1.2)
Total Protein: 6.6 g/dL (ref 6.5–8.1)
Total Protein: 5.9 g/dL — ABNORMAL LOW (ref 6.5–8.1)

## 2020-11-07 LAB — URINALYSIS, ROUTINE W REFLEX MICROSCOPIC
Bilirubin Urine: NEGATIVE
Glucose, UA: NEGATIVE mg/dL
Hgb urine dipstick: NEGATIVE
Ketones, ur: NEGATIVE mg/dL
Leukocytes,Ua: NEGATIVE
Nitrite: NEGATIVE
Protein, ur: NEGATIVE mg/dL
Specific Gravity, Urine: 1.01 (ref 1.005–1.030)
pH: 5 (ref 5.0–8.0)

## 2020-11-07 LAB — RESP PANEL BY RT-PCR (FLU A&B, COVID) ARPGX2
Influenza A by PCR: NEGATIVE
Influenza B by PCR: NEGATIVE
SARS Coronavirus 2 by RT PCR: NEGATIVE

## 2020-11-07 LAB — PROTIME-INR
INR: 1.1 (ref 0.8–1.2)
Prothrombin Time: 13.3 seconds (ref 11.4–15.2)

## 2020-11-07 LAB — LACTIC ACID, PLASMA: Lactic Acid, Venous: 0.5 mmol/L (ref 0.5–1.9)

## 2020-11-07 LAB — PHOSPHORUS: Phosphorus: 2.7 mg/dL (ref 2.5–4.6)

## 2020-11-07 LAB — MAGNESIUM: Magnesium: 1.6 mg/dL — ABNORMAL LOW (ref 1.7–2.4)

## 2020-11-07 LAB — APTT: aPTT: 31 seconds (ref 24–36)

## 2020-11-07 MED ORDER — INSULIN ASPART 100 UNIT/ML ~~LOC~~ SOLN
0.0000 [IU] | Freq: Three times a day (TID) | SUBCUTANEOUS | Status: DC
  Administered 2020-11-07 – 2020-11-10 (×2): 1 [IU] via SUBCUTANEOUS

## 2020-11-07 MED ORDER — PROCHLORPERAZINE EDISYLATE 10 MG/2ML IJ SOLN
10.0000 mg | Freq: Once | INTRAMUSCULAR | Status: AC
  Administered 2020-11-07: 10 mg via INTRAVENOUS
  Filled 2020-11-07: qty 2

## 2020-11-07 MED ORDER — POTASSIUM CHLORIDE 10 MEQ/50ML IV SOLN
10.0000 meq | INTRAVENOUS | Status: AC
  Administered 2020-11-07 (×3): 10 meq via INTRAVENOUS
  Filled 2020-11-07 (×3): qty 50

## 2020-11-07 MED ORDER — BUPROPION HCL ER (SR) 100 MG PO TB12
200.0000 mg | ORAL_TABLET | Freq: Every day | ORAL | Status: DC
  Administered 2020-11-07 – 2020-11-10 (×4): 200 mg via ORAL
  Filled 2020-11-07 (×5): qty 2

## 2020-11-07 MED ORDER — TRAVASOL 10 % IV SOLN
INTRAVENOUS | Status: AC
  Filled 2020-11-07: qty 846

## 2020-11-07 MED ORDER — HYDROMORPHONE HCL 1 MG/ML IJ SOLN
1.0000 mg | INTRAMUSCULAR | Status: DC | PRN
  Administered 2020-11-07 – 2020-11-10 (×18): 1 mg via INTRAVENOUS
  Filled 2020-11-07 (×18): qty 1

## 2020-11-07 MED ORDER — CYCLOSPORINE 0.05 % OP EMUL
1.0000 [drp] | Freq: Two times a day (BID) | OPHTHALMIC | Status: DC
  Administered 2020-11-07 – 2020-11-10 (×7): 1 [drp] via OPHTHALMIC
  Filled 2020-11-07 (×8): qty 1

## 2020-11-07 MED ORDER — KETOROLAC TROMETHAMINE 15 MG/ML IJ SOLN
15.0000 mg | Freq: Once | INTRAMUSCULAR | Status: AC
  Administered 2020-11-07: 15 mg via INTRAVENOUS
  Filled 2020-11-07: qty 1

## 2020-11-07 MED ORDER — PANCRELIPASE (LIP-PROT-AMYL) 12000-38000 UNITS PO CPEP
36000.0000 [IU] | ORAL_CAPSULE | Freq: Three times a day (TID) | ORAL | Status: DC | PRN
  Administered 2020-11-08 – 2020-11-09 (×2): 36000 [IU] via ORAL
  Filled 2020-11-07: qty 1
  Filled 2020-11-07 (×2): qty 3

## 2020-11-07 MED ORDER — SUCRALFATE 1 GM/10ML PO SUSP
1.0000 g | Freq: Three times a day (TID) | ORAL | Status: DC
  Administered 2020-11-07 – 2020-11-10 (×14): 1 g via ORAL
  Filled 2020-11-07 (×14): qty 10

## 2020-11-07 MED ORDER — AMLODIPINE BESYLATE 10 MG PO TABS
10.0000 mg | ORAL_TABLET | Freq: Every day | ORAL | Status: DC
  Administered 2020-11-07 – 2020-11-10 (×4): 10 mg via ORAL
  Filled 2020-11-07 (×4): qty 1

## 2020-11-07 MED ORDER — TRAVASOL 10 % IV SOLN
INTRAVENOUS | Status: DC
  Filled 2020-11-07: qty 846

## 2020-11-07 MED ORDER — PROMETHAZINE HCL 25 MG PO TABS: 25.0000 mg | ORAL_TABLET | Freq: Four times a day (QID) | ORAL | Status: DC | PRN

## 2020-11-07 MED ORDER — ENOXAPARIN SODIUM 40 MG/0.4ML ~~LOC~~ SOLN
40.0000 mg | SUBCUTANEOUS | Status: DC
  Administered 2020-11-07 – 2020-11-10 (×4): 40 mg via SUBCUTANEOUS
  Filled 2020-11-07 (×4): qty 0.4

## 2020-11-07 MED ORDER — POLYVINYL ALCOHOL 1.4 % OP SOLN
1.0000 [drp] | Freq: Four times a day (QID) | OPHTHALMIC | Status: DC
  Administered 2020-11-07 – 2020-11-10 (×12): 1 [drp] via OPHTHALMIC
  Filled 2020-11-07 (×2): qty 15

## 2020-11-07 MED ORDER — VANCOMYCIN HCL IN DEXTROSE 1-5 GM/200ML-% IV SOLN: 1000.0000 mg | INTRAVENOUS | Status: DC

## 2020-11-07 MED ORDER — METOPROLOL TARTRATE 50 MG PO TABS
100.0000 mg | ORAL_TABLET | Freq: Two times a day (BID) | ORAL | Status: DC
Start: 2020-11-07 — End: 2020-11-10
  Administered 2020-11-07 – 2020-11-10 (×7): 100 mg via ORAL
  Filled 2020-11-07 (×7): qty 2

## 2020-11-07 MED ORDER — VANCOMYCIN HCL 1250 MG/250ML IV SOLN
1250.0000 mg | Freq: Once | INTRAVENOUS | Status: AC
  Administered 2020-11-07: 1250 mg via INTRAVENOUS
  Filled 2020-11-07: qty 250

## 2020-11-07 MED ORDER — DULOXETINE HCL 60 MG PO CPEP
90.0000 mg | ORAL_CAPSULE | Freq: Every day | ORAL | Status: DC
  Administered 2020-11-07 – 2020-11-10 (×4): 90 mg via ORAL
  Filled 2020-11-07 (×4): qty 1

## 2020-11-07 MED ORDER — DIPHENOXYLATE-ATROPINE 2.5-0.025 MG PO TABS: 1.0000 | ORAL_TABLET | Freq: Four times a day (QID) | ORAL | Status: DC | PRN

## 2020-11-07 MED ORDER — HYDRALAZINE HCL 50 MG PO TABS
50.0000 mg | ORAL_TABLET | Freq: Three times a day (TID) | ORAL | Status: DC
  Administered 2020-11-07 – 2020-11-10 (×10): 50 mg via ORAL
  Filled 2020-11-07 (×10): qty 1

## 2020-11-07 MED ORDER — VITAMIN B-12 1000 MCG PO TABS
1000.0000 ug | ORAL_TABLET | Freq: Every day | ORAL | Status: DC
  Administered 2020-11-08 – 2020-11-10 (×3): 1000 ug via ORAL
  Filled 2020-11-07 (×5): qty 1

## 2020-11-07 MED ORDER — BUDESONIDE 3 MG PO CPEP
6.0000 mg | ORAL_CAPSULE | Freq: Every day | ORAL | Status: DC
  Administered 2020-11-07 – 2020-11-10 (×4): 6 mg via ORAL
  Filled 2020-11-07 (×5): qty 2

## 2020-11-07 MED ORDER — ADULT MULTIVITAMIN W/MINERALS CH
1.0000 | ORAL_TABLET | Freq: Every day | ORAL | Status: DC
  Administered 2020-11-08 – 2020-11-10 (×3): 1 via ORAL
  Filled 2020-11-07 (×4): qty 1

## 2020-11-07 MED ORDER — SODIUM CHLORIDE 0.9 % IV SOLN
2.0000 g | Freq: Two times a day (BID) | INTRAVENOUS | Status: DC
  Administered 2020-11-07 (×2): 2 g via INTRAVENOUS
  Filled 2020-11-07 (×2): qty 2

## 2020-11-07 MED ORDER — HYDROMORPHONE HCL 2 MG PO TABS: 4.0000 mg | ORAL_TABLET | Freq: Four times a day (QID) | ORAL | Status: DC | PRN

## 2020-11-07 MED ORDER — ACETAMINOPHEN 650 MG RE SUPP: 650.0000 mg | Freq: Four times a day (QID) | RECTAL | Status: DC | PRN

## 2020-11-07 MED ORDER — VITAMIN D 25 MCG (1000 UNIT) PO TABS
1000.0000 [IU] | ORAL_TABLET | Freq: Every day | ORAL | Status: DC
  Administered 2020-11-08 – 2020-11-10 (×3): 1000 [IU] via ORAL
  Filled 2020-11-07 (×4): qty 1

## 2020-11-07 MED ORDER — METHADONE HCL 5 MG PO TABS
5.0000 mg | ORAL_TABLET | Freq: Every day | ORAL | Status: DC
  Administered 2020-11-07 (×4): 5 mg via ORAL
  Filled 2020-11-07 (×6): qty 1

## 2020-11-07 MED ORDER — PANTOPRAZOLE SODIUM 40 MG PO TBEC
40.0000 mg | DELAYED_RELEASE_TABLET | Freq: Every day | ORAL | Status: DC
  Administered 2020-11-07 – 2020-11-10 (×4): 40 mg via ORAL
  Filled 2020-11-07 (×4): qty 1

## 2020-11-07 MED ORDER — LEVOFLOXACIN IN D5W 500 MG/100ML IV SOLN
500.0000 mg | INTRAVENOUS | Status: DC
  Administered 2020-11-07 – 2020-11-09 (×3): 500 mg via INTRAVENOUS
  Filled 2020-11-07 (×3): qty 100

## 2020-11-07 MED ORDER — POTASSIUM CHLORIDE CRYS ER 20 MEQ PO TBCR
40.0000 meq | EXTENDED_RELEASE_TABLET | Freq: Every day | ORAL | Status: DC
  Administered 2020-11-07 – 2020-11-10 (×4): 40 meq via ORAL
  Filled 2020-11-07 (×4): qty 2

## 2020-11-07 MED ORDER — TRAZODONE HCL 50 MG PO TABS: 200.0000 mg | ORAL_TABLET | Freq: Every evening | ORAL | Status: DC | PRN

## 2020-11-07 MED ORDER — MAGNESIUM SULFATE IN D5W 1-5 GM/100ML-% IV SOLN
1.0000 g | Freq: Once | INTRAVENOUS | Status: AC
  Administered 2020-11-07: 1 g via INTRAVENOUS
  Filled 2020-11-07: qty 100

## 2020-11-07 MED ORDER — FAMOTIDINE 20 MG PO TABS
20.0000 mg | ORAL_TABLET | Freq: Every day | ORAL | Status: DC
  Administered 2020-11-07 – 2020-11-09 (×3): 20 mg via ORAL
  Filled 2020-11-07 (×3): qty 1

## 2020-11-07 MED ORDER — ACETAMINOPHEN 325 MG PO TABS
650.0000 mg | ORAL_TABLET | Freq: Four times a day (QID) | ORAL | Status: DC | PRN
  Administered 2020-11-07: 650 mg via ORAL
  Filled 2020-11-07: qty 2

## 2020-11-07 MED ORDER — HYDROMORPHONE HCL 1 MG/ML IJ SOLN
1.0000 mg | Freq: Once | INTRAMUSCULAR | Status: AC
Start: 2020-11-07 — End: 2020-11-07
  Administered 2020-11-07: 1 mg via INTRAVENOUS
  Filled 2020-11-07: qty 1

## 2020-11-07 NOTE — Progress Notes (Signed)
Patient seen and examined this morning, denies overnight, H&P reviewed and agree with assessment and plan  59 year old female with Crohn's disease, multiple surgeries in the past with short gut syndrome, chronic PICC line with TPN, recurrent PICC line infections comes to the hospital with weakness and fever.  There is concern about recurrent bacteremia/PICC line infection.  Placed on intravenous antibiotics, cultures are pending.  ID consulted.  Caitlin Peters M. Cruzita Lederer, MD, PhD [facility]  Between 7 am - 7 pm you can contact me via [place] or [place].  I am not available 7 pm - 7 am, please contact night coverage MD/APP via [facility]

## 2020-11-07 NOTE — H&P (Addendum)
History and Physical    Caitlin Peters VQM:[id number] DOB: 01-14-1961 DOA: 11/06/2020  PCP: [name], [name], MD  Patient coming from: Home.  Chief Complaint: Fever and chills.  HPI: Caitlin Peters is a 59 y.o. female with history of Crohn's disease status post multiple surgeries with short gut syndrome usually follows up at [facility] has had recurrent PICC line infection and is on chronic TPN was recently admitted for nausea vomiting was transferred to [facility] had colonoscopy which was unremarkable felt patient symptoms may be functional presents to the ER with fever and chills for the last 3 days.  Denies any nausea vomiting shortness of breath productive cough or chest pain.  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  ED Course: In the ER patient had a fever of 101 F labs were largely unremarkable with hemoglobin and creatinine at baseline Covid test was negative.  Given the fever with history of recurrent bacteremia with PICC line blood cultures were drawn and started on empiric antibiotics admitted for further observation.  Review of Systems: As per HPI, rest all negative.   Past Medical History:  Diagnosis Date  . Acute pancreatitis 04/13/2020  . Anasarca 10/2019  . AVN (avascular necrosis of bone) ([place])   . Cataract   . Choledocholithiasis (sludge) s/p ERCP 10/2019 10/21/2020  . Chronic pain syndrome   . CKD (chronic kidney disease), stage III ([place])   . Crohn disease ([place])   . Crohn's disease of small & large intestine with SGS 1984   Caitlin Peters is a 59year old female with a history of Crohn's disease diagnosed in 59 (age 13), history of long-term steroid use with osteoporosis, S/P multiple bowel resections ([id number]) complicated by chronic back and abdominal pain, steatorrhea and short bowel syndrome. The patient has been left with ~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 attached to proximal transverse colon through rectum. She has been  . Depression   .  Diverticulosis   . GERD (gastroesophageal reflux disease)   . HTN (hypertension)   . IDA (iron deficiency anemia)   . Malnutrition ([place])   . Mass in chest   . Osteoporosis   . Pancreatitis   . 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 Vitamin B12 deficiency     Past Surgical History:  Procedure Laterality Date  . ABDOMINAL ADHESION SURGERY  01/22/2018  . APPENDECTOMY  1989  . BILIARY DILATION  11/26/2019   Procedure: BILIARY DILATION;  Surgeon: Jackquline Denmark, MD;  Location: [facility];  Service: Endoscopy;;  . BILIARY DILATION  03/08/2020   Procedure: BILIARY DILATION;  Surgeon: Irving Copas., MD;  Location: [place];  Service: Gastroenterology;;  . BIOPSY  03/08/2020   Procedure: BIOPSY;  Surgeon: Irving Copas., MD;  Location: [place];  Service: Gastroenterology;;  . CHEST WALL RESECTION     right thoracotomy,resection of chest mass with anterior rib and reconstruction using prosthetic mesh and video arthroscopy  . CHOLECYSTECTOMY  01/22/2018  . COLONOSCOPY  2019  . ENTEROSTOMY CLOSURE  04/1999  . ERCP N/A 11/26/2019   Procedure: ENDOSCOPIC RETROGRADE CHOLANGIOPANCREATOGRAPHY (ERCP);  Surgeon: Jackquline Denmark, MD;  Location: [facility];  Service: Endoscopy;  Laterality: N/A;  . ERCP N/A 03/08/2020   Procedure: ENDOSCOPIC RETROGRADE CHOLANGIOPANCREATOGRAPHY (  ERCP);  Surgeon: Mansouraty, Telford Nab., MD;  Location: [place];  Service: Gastroenterology;  Laterality: N/A;  . ESOPHAGOGASTRODUODENOSCOPY N/A 03/08/2020   Procedure: ESOPHAGOGASTRODUODENOSCOPY (EGD);  Surgeon: Irving Copas., MD;  Location: [place];  Service: Gastroenterology;  Laterality: N/A;  . EUS N/A 03/08/2020   Procedure: UPPER  ENDOSCOPIC ULTRASOUND (EUS) LINEAR;  Surgeon: Irving Copas., MD;  Location: [place];  Service: Gastroenterology;  Laterality: N/A;  . ILEOCECETOMY  03/1999   ileocolon resection with abdominal stoma  . ILEOSTOMY CLOSURE  2001  . IR FLUORO GUIDE CV LINE LEFT  01/07/2020  . IR FLUORO GUIDE CV LINE LEFT  03/09/2020  . IR FLUORO GUIDE CV LINE LEFT  05/09/2020  . IR FLUORO GUIDE CV LINE LEFT  07/20/2020  . IR FLUORO GUIDE CV LINE RIGHT  08/05/2020  . IR PTA VENOUS EXCEPT DIALYSIS CIRCUIT  01/07/2020  . IR REMOVAL TUN CV CATH W/O FL  08/05/2020  . IR US GUIDE VASC ACCESS LEFT     x 2 06/17/19 and 09/14/2019  . IR US GUIDE VASC ACCESS RIGHT  08/05/2020  . KNEE SURGERY     right knee   . LAPAROSCOPIC SMALL BOWEL RESECTION  2009   2000-2009.  SB resections for Crohns Disease - now with Short gut  . OMENTECTOMY  01/22/2018  . PARTIAL HYSTERECTOMY  1984   with LSO  . REMOVAL OF STONES  11/26/2019   Procedure: REMOVAL OF STONES;  Surgeon: Jackquline Denmark, MD;  Location: [facility];  Service: Endoscopy;;  . REMOVAL OF STONES  03/08/2020   Procedure: REMOVAL OF STONES;  Surgeon: Irving Copas., MD;  Location: [place];  Service: Gastroenterology;;  . SALPINGOOPHORECTOMY Left 1984  . SALPINGOOPHORECTOMY Right 1990  . SERIAL TRANSVERSE ENTEROPLASTY (STEP) - SMALL BOWEL LENGTHENING  01/22/2018   Dr Alene Mires, [place] length from 120 to 165cm   . SMALL INTESTINE SURGERY  2002  . SMALL INTESTINE SURGERY  2003  . SPHINCTEROTOMY  11/26/2019   Procedure: SPHINCTEROTOMY;  Surgeon: Jackquline Denmark, MD;  Location: [facility];  Service: Endoscopy;;  . TOTAL ABDOMINAL HYSTERECTOMY  1990   with RSO  . UPPER GASTROINTESTINAL ENDOSCOPY       reports that she has quit smoking. She has never used smokeless tobacco. She reports previous alcohol use. She reports that she does not use drugs.  Allergies  Allergen Reactions  . Cefepime Other (See Comments)    Neurotoxicity  occurring in setting of AKI. Ceftriaxone tolerated during same admit  . Gabapentin Other (See Comments)    unknown  . Hyoscyamine Hives and Swelling    Legs swelling   Disorientation  . Lyrica [Pregabalin] Other (See Comments)    unknown  . Meperidine Hives    Other reaction(s): GI Upset Due to [place]   . Topamax [Topiramate] Other (See Comments)    unknown  . Zosyn [Piperacillin Sod-Tazobactam So]     Patient reports it makes her vomit, her neck stiff, and her "heart feel funny"  . Fentanyl Rash    Pt is allergic to fentanyl patch related to the glue (gives her a rash) Pt states she is NOT allergic to fentanyl IV medicine  . Morphine And Related Rash    Family History  Problem Relation Age of Onset  . Breast cancer Sister   . Multiple sclerosis Sister   . Diabetes Sister   . Lupus Sister   . Colon cancer Other   . Crohn's disease Other   .  Seizures Mother   . Glaucoma Mother   . CAD Father   . Heart disease Father   . Hypertension Father     Prior to Admission medications   Medication Sig Start Date End Date Taking? Authorizing Provider  acetaminophen (TYLENOL) 325 MG tablet Take 650 mg by mouth every 6 (six) hours as needed for mild pain.     [provider]  amLODipine (NORVASC) 10 MG tablet Take 1 tablet (10 mg total) by mouth daily. 06/23/20 10/21/20  Erline Hau, MD  budesonide (ENTOCORT EC) 3 MG 24 hr capsule TAKE 3 CAPSULES BY MOUTH ONCE DAILY Patient taking differently: Take 3 mg by mouth daily.  04/05/20   [name], [name], MD  buPROPion [facility] SR) 100 MG 12 hr tablet Take 2 tablets by mouth once daily Patient taking differently: Take 200 mg by mouth daily.  07/04/20   [name], [name], MD  calcium carbonate (TUMS - DOSED IN MG ELEMENTAL CALCIUM) 500 MG chewable tablet Chew 1 tablet by mouth 3 (three) times daily as needed for indigestion or heartburn.    [provider]  cholecalciferol (VITAMIN D3) 25  MCG (1000 UT) tablet Take 1,000 Units by mouth daily.     [provider]  cycloSPORINE (RESTASIS) 0.05 % ophthalmic emulsion Place 1 drop into both eyes 2 (two) times daily.    [provider]  dexlansoprazole (DEXILANT) 60 MG capsule Take 1 capsule (60 mg total) by mouth daily. 06/28/20   [name], [name], MD  Dextran 70-Hypromellose 0.1-0.3 % SOLN Place 1 drop into both eyes 4 (four) times daily.    [provider]  diphenoxylate-atropine (LOMOTIL) 2.5-0.025 MG tablet Take 1 tablet by mouth 4 (four) times daily as needed for diarrhea or loose stools. 10/17/20   Burchette, Alinda Sierras, MD  DULoxetine (CYMBALTA) 30 MG capsule Take 3 capsules (90 mg total) by mouth daily. 09/13/20   [name], [name], MD  estradiol (ESTRACE) 2 MG tablet Take 1 tablet (2 mg total) by mouth daily. 06/28/20   [name], [name], MD  famotidine (PEPCID) 20 MG tablet Take 20 mg by mouth at bedtime.    [provider]  hydrALAZINE (APRESOLINE) 50 MG tablet Take 1 tablet (50 mg total) by mouth 3 (three) times daily. 06/28/20   [name], [name], MD  HYDROmorphone (DILAUDID) 4 MG tablet Take 4 mg by mouth 4 (four) times daily as needed for moderate pain.  09/15/20   [provider]  lipase/protease/amylase (CREON) 36000 UNITS CPEP capsule Take 1 capsule (36,000 Units total) by mouth 3 (three) times daily as needed (with meals for digestion). 06/28/20   [name], [name], MD  loperamide (IMODIUM) 2 MG capsule TAKE 1 CAPSULE BY MOUTH AS NEEDED FOR DIARRHEA OR  LOOSE  STOOLS Patient taking differently: Take 2 mg by mouth as needed for diarrhea or loose stools.  07/29/20   [name], [name], MD  methadone (DOLOPHINE) 5 MG tablet Take 1 tablet (5 mg total) by mouth 5 (five) times daily. 06/30/20   Florencia Reasons, MD  metoprolol tartrate (LOPRESSOR) 100 MG tablet Take 1 tablet (100 mg total) by mouth 2 (two) times daily. 06/28/20   [name], [name],  MD  Multiple Vitamins-Minerals (MULTIVITAMIN ADULT PO) Take 1 tablet by mouth daily.    [provider]  NARCAN 4 MG/0.1ML LIQD nasal spray kit Place 1 spray into the nose once.  10/14/20   [provider]  potassium chloride SA (KLOR-CON) 20 MEQ tablet TAKE TWO TABLETS BY MOUTH ONCE DAILY 09/27/20   [name], [name], MD  PRESCRIPTION MEDICATION Inject 1 each into the vein daily. Home TPN . [facility] . 1 bag for 16 hours. [phone]    [provider]  Probiotic Product (PROBIOTIC-10 PO) Take 1 capsule by mouth daily.     [provider]  promethazine (PHENERGAN) 25 MG tablet TAKE 1 TABLET BY MOUTH EVERY 6 HOURS AS NEEDED FOR NAUSEA AND VOMITING 10/07/20   [name], [name], MD  senna-docusate (SENOKOT-S) 8.6-50 MG tablet Take 1 tablet by mouth 2 (two) times daily. Patient not taking: Reported on 10/21/2020 08/12/20   Eugenie Filler, MD  sodium chloride 0.9 % infusion Inject into the vein. 10/16/20   [provider]  sucralfate (CARAFATE) 1 GM/10ML suspension Take 10 mLs (1 g total) by mouth 4 (four) times daily -  with meals and at bedtime. 06/28/20   [name], [name], MD  Trace Minerals Cu-Mn-Se-Zn (TRALEMENT IV) Inject 1 mL into the vein. Used in hydration bag 4 times weekly    [provider]  traZODone (DESYREL) 100 MG tablet Take 2 tablets (200 mg total) by mouth at bedtime as needed for sleep. 09/13/20   [name], [name], MD  vitamin B-12 (CYANOCOBALAMIN) 100 MCG tablet Take 1,000 mcg by mouth daily.     [provider]    Physical Exam: Constitutional: Moderately built and nourished. Vitals:   11/07/20 0100 11/07/20 0130 11/07/20 0200 11/07/20 0230  BP: (!) 162/67 (!) 157/69 (!) 161/71 (!) 149/75  Pulse: 80 81 78 73  Resp: _0 Temp:      TempSrc:      SpO2: 99% 99% 96% 96%  Weight:      Height:       Eyes: Anicteric no pallor. ENMT: No  discharge from the ears eyes nose or mouth. Neck: No mass felt.  No neck rigidity. Respiratory: No rhonchi or crepitations. Cardiovascular: S1-S2 heard. Abdomen: Soft nontender bowel sounds present. Musculoskeletal: No edema. Skin: Right-sided thigh PICC line seen tunnel no skin rash. Neurologic: Alert awake oriented to time place and person.  Moves all extremities. Psychiatric: Appears normal.  Normal affect.   Labs on Admission: I have personally reviewed following labs and imaging studies  CBC: Recent Labs  Lab 11/06/20 2329  WBC 7.2  NEUTROABS 6.0  HGB 8.2*  HCT 25.0*  MCV 93.6  PLT 919*   Basic Metabolic Panel: Recent Labs  Lab 11/06/20 2329  NA 137  K 3.6  CL 109  CO2 20*  GLUCOSE 101*  BUN 22*  CREATININE 1.29*  CALCIUM 8.5*   GFR: Estimated Creatinine Clearance: 45.4 mL/min (A) (by C-G formula based on SCr of 1.29 mg/dL (H)). Liver Function Tests: Recent Labs  Lab 11/06/20 2329  AST 142*  ALT 287*  ALKPHOS 195*  BILITOT 0.6  PROT 6.6  ALBUMIN 3.1*   No results for input(s): LIPASE, AMYLASE in the last 168 hours. No results for input(s): AMMONIA in the last 168 hours. Coagulation Profile: Recent Labs  Lab 11/06/20 2355  INR 1.1   Cardiac Enzymes: No results for input(s): CKTOTAL, CKMB, CKMBINDEX, TROPONINI in the last 168 hours. BNP (last 3 results) No results for input(s): PROBNP in the last 8760 hours. HbA1C: No results for input(s): HGBA1C in the last 72 hours. CBG: No results for input(s):  GLUCAP in the last 168 hours. Lipid Profile: No results for input(s): CHOL, HDL, LDLCALC, TRIG, CHOLHDL, LDLDIRECT in the last 72 hours. Thyroid Function Tests: No results for input(s): TSH, T4TOTAL, FREET4, T3FREE, THYROIDAB in the last 72 hours. Anemia Panel: No results for input(s): VITAMINB12, FOLATE, FERRITIN, TIBC, IRON, RETICCTPCT in the last 72 hours. Urine analysis:    Component Value Date/Time   COLORURINE STRAW (A) 11/06/2020 2329    APPEARANCEUR CLEAR 11/06/2020 2329   LABSPEC 1.010 11/06/2020 2329   PHURINE 5.0 11/06/2020 2329   GLUCOSEU NEGATIVE 11/06/2020 2329   HGBUR NEGATIVE 11/06/2020 2329   BILIRUBINUR NEGATIVE 11/06/2020 2329   KETONESUR NEGATIVE 11/06/2020 2329   PROTEINUR NEGATIVE 11/06/2020 2329   NITRITE NEGATIVE 11/06/2020 2329   LEUKOCYTESUR NEGATIVE 11/06/2020 2329   Sepsis Labs: _0 (procalcitonin:4,lacticidven:4) )No results found for this or any previous visit (from the past 240 hour(s)).   Radiological Exams on Admission: No results found.  EKG: Independently reviewed.  Normal sinus rhythm.  Assessment/Plan Principal Problem:   SIRS (systemic inflammatory response syndrome) ([name]) Active Problems:   Short gut syndrome   HTN (hypertension), benign   Chronic pain syndrome   On total parenteral nutrition (TPN)   CKD (chronic kidney disease) stage 3, GFR 30-59 ml/min ([name])    1. Fever concerning for bacteremia given the history of PICC line placement and TPN.  Blood cultures were drawn empiric antibiotics have been started.  Follow cultures. 2. Thrombocytopenia likely secondary to infectious source. Follow CBC. Any further worsening will need further work-up 3. History of Crohn's disease with multiple surgeries and short gut syndrome on TPN patient also takes oral medications.  Patient is also on Entocort orally.  Continue antimotility drugs. 4. History of hypertension we will continue patient's home dose of metoprolol amlodipine and hydralazine. 5. Chronic anemia from chronic disease follow CBC.  Hemoglobin appears to be at baseline. 6. Chronic kidney disease stage III creatinine appears to be at baseline. 7. On TPN per pharmacy. 8. Chronic pain on methadone and Dilaudid.  Confirmed with pharmacy.  Confirmed with patient's recent discharge summary.   DVT prophylaxis: Lovenox. Code Status: Full code. Family Communication: Discussed with patient. Disposition Plan: Home. Consults  called: None. Admission status: Observation.   Rise Patience MD [facility] Pager [phone].  If 7PM-7AM, please contact night-coverage [email or web address] Password [email or web address]  11/07/2020, 2:50 AM

## 2020-11-07 NOTE — Consult Note (Signed)
[facility]       Reason for Consult: fever    Referring Physician: Dr. Cruzita Lederer  Principal Problem:   SIRS (systemic inflammatory response syndrome) ([name]) Active Problems:   Short gut syndrome   HTN (hypertension), benign   Chronic pain syndrome   On total parenteral nutrition (TPN)   CKD (chronic kidney disease) stage 3, GFR 30-59 ml/min ([name])    amLODipine  10 mg Oral Daily   budesonide  6 mg Oral Daily   buPROPion  200 mg Oral Daily   cholecalciferol  1,000 Units Oral Daily   cycloSPORINE  1 drop Both Eyes BID   DULoxetine  90 mg Oral Daily   enoxaparin (LOVENOX) injection  40 mg Subcutaneous Q24H   famotidine  20 mg Oral QHS   hydrALAZINE  50 mg Oral TID   insulin aspart  0-9 Units Subcutaneous Q8H   methadone  5 mg Oral 5 X Daily   metoprolol tartrate  100 mg Oral BID   multivitamin with minerals  1 tablet Oral Daily   pantoprazole  40 mg Oral Daily   polyvinyl alcohol  1 drop Both Eyes QID   potassium chloride SA  40 mEq Oral Daily   sucralfate  1 g Oral TID WC & HS   vitamin B-12  1,000 mcg Oral Daily    Recommendations: levaquin  Stop other antibiotics   Assessment: She has a history of Stenotrophomonas infection via her remaining line with line exchange in the past and came in with a fever.  Was 100 at home.  No other symptoms.  Based on previous culture will use levaquin for now.  If blood culture remain negative tomorrow and she continues to look and feel well, ok for discharge from an ID standpoint.  Has an appt with Dr. Tommy Medal next week.    Antibiotics: Vancomycin, cefepime, metronidazole  HPI: OTHELIA RIEDERER is a 59 y.o. female with with Crohn's disease, chronic TPN for short gut syndrome came in with a fever.  She gets fevers periodically that resolve on their own but with a history of bacteremia, her daughter brought her in.  She feels well today with no further fever since initially on admission.   WBC wnl.  No other no symptoms including no new abdominal pain, no dysuria, no sob, no new cough.    Review of Systems:  Constitutional: negative for fevers and chills Integument/breast: negative for rash All other systems reviewed and are negative    Past Medical History:  Diagnosis Date   Acute pancreatitis 04/13/2020   Anasarca 10/2019   AVN (avascular necrosis of bone) ([name])    Cataract    Choledocholithiasis (sludge) s/p ERCP 10/2019 10/21/2020   Chronic pain syndrome    CKD (chronic kidney disease), stage III ([place])    Crohn disease ([place])    Crohn's disease of small & large intestine with [place]   FAIGE SEELY is a 59year old female with a history of Crohn's disease diagnosed in 69 (age 41), history of long-term steroid use with osteoporosis, S/P multiple bowel resections ([id number]) complicated by chronic back and abdominal pain, steatorrhea and short bowel syndrome. The patient has been left with ~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 attached to proximal transverse colon through rectum. 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; 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; jejunal to transverse colon anastomosis Treated at [facility] lengthening to 165cm Dr Alene Mires, [facility]   Vitamin B12 deficiency     Social History   Tobacco Use   Smoking status: Former Smoker   Smokeless tobacco: Never Used  Scientific laboratory technician Use: Never used  Substance Use Topics   Alcohol use: Not Currently   Drug use: Never    Family History  Problem Relation Age of Onset   Breast cancer Sister    Multiple sclerosis Sister    Diabetes Sister    Lupus Sister     Colon cancer Other    Crohn's disease Other    Seizures Mother    Glaucoma Mother    CAD Father    Heart disease Father    Hypertension Father     Allergies  Allergen Reactions   Cefepime Other (See Comments)    Neurotoxicity occurring in setting of AKI. Ceftriaxone tolerated during same admit   Gabapentin Other (See Comments)    unknown   Hyoscyamine Hives and Swelling    Legs swelling   Disorientation   Lyrica [Pregabalin] Other (See Comments)    unknown   Meperidine Hives    Other reaction(s): GI Upset Due to Chrones    Topamax [Topiramate] Other (See Comments)    unknown   Zosyn [Piperacillin Sod-Tazobactam So]     Patient reports it makes her vomit, her neck stiff, and her "heart feel funny"   Fentanyl Rash    Pt is allergic to fentanyl patch related to the glue (gives her a rash) Pt states she is NOT allergic to fentanyl IV medicine   Morphine And Related Rash    Physical Exam: Constitutional: in no apparent distress  Vitals:   11/07/20 0958 11/07/20 1346  BP: (!) 168/74 (!) 143/63  Pulse: 68 (!) 55  Resp: 20   Temp: 98.4 F (36.9 C) 98.2 F (36.8 C)  SpO2: 98% 98%   EYES: anicteric Cardiovascular: Cor RRR Respiratory: clear; GI: soft Musculoskeletal: no pedal edema noted  Lab Results  Component Value Date   WBC 7.1 11/07/2020   HGB 7.6 (L) 11/07/2020   HCT 23.0 (L) 11/07/2020   MCV 92.7 11/07/2020   PLT 120 (L) 11/07/2020    Lab Results  Component Value Date   CREATININE 1.12 (H) 11/07/2020   BUN 19 11/07/2020   NA 135 11/07/2020   K 3.3 (L) 11/07/2020   CL 107 11/07/2020   CO2 19 (L) 11/07/2020    Lab Results  Component Value Date   ALT 238 (H) 11/07/2020   AST 109 (H) 11/07/2020   ALKPHOS 170 (H) 11/07/2020     Microbiology: Recent Results (from the past 240 hour(s))  Blood Culture (routine x 2)     Status: None (Preliminary result)   Collection Time: 11/06/20 11:55 PM   Specimen: BLOOD  Result Value Ref  Range Status   Specimen Description   Final    BLOOD RIGHT ANTECUBITAL Performed at [facility], [place] [address] [place], [place] [address]    Special Requests   Final    BOTTLES DRAWN AEROBIC AND ANAEROBIC Blood Culture adequate volume Performed at [facility] [address]  [place], [place] [address]    Culture  Setup Time PENDING  Incomplete   Culture PENDING  Incomplete   Report Status PENDING  Incomplete  Resp Panel by RT-PCR (Flu A&B, Covid) Nasopharyngeal Swab     Status: None   Collection Time: 11/07/20  1:46 AM   Specimen: Nasopharyngeal Swab; Nasopharyngeal(NP) swabs in vial transport medium  Result Value Ref Range Status   SARS Coronavirus 2 by RT PCR NEGATIVE NEGATIVE Final    Comment: (NOTE) SARS-CoV-2 target nucleic acids are NOT DETECTED.  The SARS-CoV-2 RNA is generally detectable in upper respiratory specimens during the acute phase of infection. The lowest concentration of SARS-CoV-2 viral copies this assay can detect is 138 copies/mL. A negative result does not preclude SARS-Cov-2 infection and should not be used as the sole basis for treatment or other patient management decisions. A negative result may occur with  improper specimen collection/handling, submission of specimen other than nasopharyngeal swab, presence of viral mutation(s) within the areas targeted by this assay, and inadequate number of viral copies(<138 copies/mL). A negative result must be combined with clinical observations, patient history, and epidemiological information. The expected result is Negative.  Fact Sheet for Patients:  EntrepreneurPulse.com.au  Fact Sheet for Healthcare Providers:  IncredibleEmployment.be  This test is no t yet approved or cleared by the [place] FDA and  has been authorized for detection and/or diagnosis of SARS-CoV-2 by FDA under an Emergency Use Authorization (EUA). This EUA will remain   in effect (meaning this test can be used) for the duration of the COVID-19 declaration under Section 564(b)(1) of the Act, 21 U.S.C.section 360bbb-3(b)(1), unless the authorization is terminated  or revoked sooner.       Influenza A by PCR NEGATIVE NEGATIVE Final   Influenza B by PCR NEGATIVE NEGATIVE Final    Comment: (NOTE) The Xpert Xpress SARS-CoV-2/FLU/RSV plus assay is intended as an aid in the diagnosis of influenza from Nasopharyngeal swab specimens and should not be used as a sole basis for treatment. Nasal washings and aspirates are unacceptable for Xpert Xpress SARS-CoV-2/FLU/RSV testing.  Fact Sheet for Patients: EntrepreneurPulse.com.au  Fact Sheet for Healthcare Providers: IncredibleEmployment.be  This test is not yet approved or cleared by the [place] FDA and has been authorized for detection and/or diagnosis of SARS-CoV-2 by FDA under an Emergency Use Authorization (EUA). This EUA will remain in effect (meaning this test can be used) for the duration of the COVID-19 declaration under Section 564(b)(1) of the Act, 21 U.S.C. section 360bbb-3(b)(1), unless the authorization is terminated or revoked.  Performed at [facility], [place] [address] [place], [place] [address]     Angelica Wix W Jazari Ober, MD [email or web address] 11/07/2020, 4:17 PM

## 2020-11-07 NOTE — Progress Notes (Signed)
PHARMACY - TOTAL PARENTERAL NUTRITION CONSULT NOTE   Indication: Chronic TPN, hx Crohn's disease with short gut syndrome  Patient Measurements: Height: 5' 8"  (172.7 cm) Weight: 61.2 kg (134 lb 14.7 oz) IBW/kg (Calculated) : 63.9 TPN AdjBW (KG): 61.2 Body mass index is 20.51 kg/m.  Assessment: Re-admitted on 12/13 with fever and chills.  History of recurrent PICC line infections.  Pharmacy is consulted to continue dosing chronic TPN while admitted.  Glucose / Insulin: 97 with AM labs.  Previously stable on chronic TPN without use of scheduled or SSI.  Goal CBGs 100-150. Electrolytes: K low at 3.3, Co2 low at 19, Mag low at 1.6.  Others WNL including Na, Phos, CorrCa 9.44 Renal: SCr 1.12, appears close to baseline ~1.  BUN 19 LFTs / TGs: elevated/improving, AST/ALT 109/238, TG pending Prealbumin / albumin: Prealbumin pending, Albumin 2.7 (12/13) Intake / Output; MIVF: LR @ 150 ml/hr per MD.  I/O not yet recorded in ED. GI Imaging: n/a Surgeries / Procedures: n/a  Central access: Femoral CVC - double lumen (08/05/20) TPN start date: chronic, PTA  Nutritional Goals (per RD recommendation pending) Inpatient cyclic TPN goal volume is 1800 ml/day and provides:  85 g protein, Kcal 1350   Home TPN formula: 1800 mL cycled over 12 hours to provide 85g protein and 1521 kcal with lipids 3x per week; without lipids 4x per week provides 85g protein and and 1121 kcal.  Average daily protein 85g and 1292 kcal.  Includes: KCl 75 mEq; Kphos 30 mmol/day, NaCl 90 mEq; NaAc 45 mEq, Mag sulfate 16 mEq  Current Nutrition:  TPN Cardiac Diet  Plan:  Now: KCl 10 mEq runs x3 runs (KCl 40 mEq PO daily ordered by MD from home meds) Mag 1g IV x1  dose  At 18:00 Resume cycled TPN 1800 mL/day over 12 hours starting at 1800 Electrolytes in TPN: 130 mEq/L of Na, 63mq/L of K, 5 mEq/L of Ca, 7.5 mEq/L of Mg, and 10 mmol/L of Phos.  - Cl:Ac ratio, Max Acetate Add standard MVI and trace elements to  TPN Initiate Sensitive q8h SSI and adjust as needed  Per Dr GCruzita Lederer stop LR @150  today at 18:00 when TPN starts. Monitor TPN labs on Mon/Thurs.  Baseline TPN labs tomorrow morning.   CGretta ArabPharmD, BCPS Clinical Pharmacist [facility] main pharmacy [phone] [id number]/13/2021 8:50 AM

## 2020-11-07 NOTE — Progress Notes (Signed)
Pharmacy Antibiotic Note  Caitlin Peters is a 59 y.o. female admitted on 11/06/2020 with sepsis.  Pharmacy has been consulted for Vancomycin & Cefepime dosing. Flagyl per MD  Plan: Cefepime 2gm q12 Vancomycin 1252m x1, then 1gm q24 Flagyl 5044mpo q8  Height: 5' 8"  (172.7 cm) Weight: 61.2 kg (134 lb 14.7 oz) IBW/kg (Calculated) : 63.9  Temp (24hrs), Avg:101 F (38.3 C), Min:101 F (38.3 C), Max:101 F (38.3 C)  No results for input(s): WBC, CREATININE, LATICACIDVEN, VANCOTROUGH, VANCOPEAK, VANCORANDOM, GENTTROUGH, GENTPEAK, GENTRANDOM, TOBRATROUGH, TOBRAPEAK, TOBRARND, AMIKACINPEAK, AMIKACINTROU, AMIKACIN in the last 168 hours.  Estimated Creatinine Clearance: 44.7 mL/min (A) (by C-G formula based on SCr of 1.31 mg/dL (H)).    Allergies  Allergen Reactions  . Cefepime Other (See Comments)    Neurotoxicity occurring in setting of AKI. Ceftriaxone tolerated during same admit  . Gabapentin Other (See Comments)    unknown  . Hyoscyamine Hives and Swelling    Legs swelling   Disorientation  . Lyrica [Pregabalin] Other (See Comments)    unknown  . Meperidine Hives    Other reaction(s): GI Upset Due to Chrones   . Topamax [Topiramate] Other (See Comments)    unknown  . Zosyn [Piperacillin Sod-Tazobactam So]     Patient reports it makes her vomit, her neck stiff, and her "heart feel funny"  . Fentanyl Rash    Pt is allergic to fentanyl patch related to the glue (gives her a rash) Pt states she is NOT allergic to fentanyl IV medicine  . Morphine And Related Rash    Antimicrobials this admission: 12/13 Cefepime >>  12/13 Vancomycin >>  12/13 Flagyl po >>  Dose adjustments this admission:  Microbiology results: 12/12 BCx x 1 set: sent  Thank you for allowing pharmacy to be a part of this patient's care.  GrMinda DittoharmD 11/07/2020 12:18 AM

## 2020-11-08 ENCOUNTER — Inpatient Hospital Stay (HOSPITAL_COMMUNITY): Payer: Medicare Other

## 2020-11-08 DIAGNOSIS — R7881 Bacteremia: Secondary | ICD-10-CM

## 2020-11-08 DIAGNOSIS — T80211A Bloodstream infection due to central venous catheter, initial encounter: Secondary | ICD-10-CM | POA: Diagnosis not present

## 2020-11-08 DIAGNOSIS — N1831 Chronic kidney disease, stage 3a: Secondary | ICD-10-CM

## 2020-11-08 DIAGNOSIS — B9689 Other specified bacterial agents as the cause of diseases classified elsewhere: Secondary | ICD-10-CM

## 2020-11-08 DIAGNOSIS — D649 Anemia, unspecified: Secondary | ICD-10-CM

## 2020-11-08 DIAGNOSIS — R7989 Other specified abnormal findings of blood chemistry: Secondary | ICD-10-CM

## 2020-11-08 DIAGNOSIS — N289 Disorder of kidney and ureter, unspecified: Secondary | ICD-10-CM | POA: Diagnosis not present

## 2020-11-08 DIAGNOSIS — I10 Essential (primary) hypertension: Secondary | ICD-10-CM

## 2020-11-08 LAB — GLUCOSE, CAPILLARY
Glucose-Capillary: 138 mg/dL — ABNORMAL HIGH (ref 70–99)
Glucose-Capillary: 97 mg/dL (ref 70–99)
Glucose-Capillary: 105 mg/dL — ABNORMAL HIGH (ref 70–99)

## 2020-11-08 LAB — DIFFERENTIAL
Abs Immature Granulocytes: 0.09 10*3/uL — ABNORMAL HIGH (ref 0.00–0.07)
Basophils Absolute: 0 10*3/uL (ref 0.0–0.1)
Basophils Relative: 0 %
Eosinophils Absolute: 0 10*3/uL (ref 0.0–0.5)
Eosinophils Relative: 0 %
Immature Granulocytes: 1 %
Lymphocytes Relative: 9 %
Lymphs Abs: 0.8 10*3/uL (ref 0.7–4.0)
Monocytes Absolute: 0.5 10*3/uL (ref 0.1–1.0)
Monocytes Relative: 6 %
Neutro Abs: 6.9 10*3/uL (ref 1.7–7.7)
Neutrophils Relative %: 84 %

## 2020-11-08 LAB — CBC
HCT: 22.8 % — ABNORMAL LOW (ref 36.0–46.0)
Hemoglobin: 7.4 g/dL — ABNORMAL LOW (ref 12.0–15.0)
MCH: 29.4 pg (ref 26.0–34.0)
MCHC: 32.5 g/dL (ref 30.0–36.0)
MCV: 90.5 fL (ref 80.0–100.0)
Platelets: 104 10*3/uL — ABNORMAL LOW (ref 150–400)
RBC: 2.52 MIL/uL — ABNORMAL LOW (ref 3.87–5.11)
RDW: 14.6 % (ref 11.5–15.5)
WBC: 8.2 10*3/uL (ref 4.0–10.5)
nRBC: 0 % (ref 0.0–0.2)

## 2020-11-08 LAB — COMPREHENSIVE METABOLIC PANEL
ALT: 154 U/L — ABNORMAL HIGH (ref 0–44)
AST: 47 U/L — ABNORMAL HIGH (ref 15–41)
Albumin: 2.5 g/dL — ABNORMAL LOW (ref 3.5–5.0)
Alkaline Phosphatase: 187 U/L — ABNORMAL HIGH (ref 38–126)
Anion gap: 7 (ref 5–15)
BUN: 23 mg/dL — ABNORMAL HIGH (ref 6–20)
CO2: 27 mmol/L (ref 22–32)
Calcium: 8.7 mg/dL — ABNORMAL LOW (ref 8.9–10.3)
Chloride: 103 mmol/L (ref 98–111)
Creatinine, Ser: 1.05 mg/dL — ABNORMAL HIGH (ref 0.44–1.00)
GFR, Estimated: >60 mL/min (ref >60)
Glucose, Bld: 115 mg/dL — ABNORMAL HIGH (ref 70–99)
Potassium: 3.8 mmol/L (ref 3.5–5.1)
Sodium: 137 mmol/L (ref 135–145)
Total Bilirubin: 0.8 mg/dL (ref 0.3–1.2)
Total Protein: 5.7 g/dL — ABNORMAL LOW (ref 6.5–8.1)

## 2020-11-08 LAB — TRIGLYCERIDES: Triglycerides: 31 mg/dL (ref <150)

## 2020-11-08 LAB — D-DIMER, QUANTITATIVE: D-Dimer, Quant: 1.88 ug/mL-FEU — ABNORMAL HIGH (ref 0.00–0.50)

## 2020-11-08 LAB — URINE CULTURE: Culture: NO GROWTH

## 2020-11-08 LAB — PHOSPHORUS: Phosphorus: 1.6 mg/dL — ABNORMAL LOW (ref 2.5–4.6)

## 2020-11-08 LAB — PREPARE RBC (CROSSMATCH)

## 2020-11-08 LAB — PREALBUMIN: Prealbumin: 21.3 mg/dL (ref 18–38)

## 2020-11-08 LAB — MAGNESIUM: Magnesium: 1.9 mg/dL (ref 1.7–2.4)

## 2020-11-08 MED ORDER — SODIUM CHLORIDE 0.9% FLUSH
10.0000 mL | Freq: Two times a day (BID) | INTRAVENOUS | Status: DC
  Administered 2020-11-08 – 2020-11-10 (×5): 10 mL

## 2020-11-08 MED ORDER — SODIUM CHLORIDE 0.9% FLUSH
10.0000 mL | INTRAVENOUS | Status: DC | PRN
Start: 2020-11-08 — End: 2020-11-10

## 2020-11-08 MED ORDER — LOPERAMIDE HCL 2 MG PO CAPS
2.0000 mg | ORAL_CAPSULE | ORAL | Status: DC | PRN
  Administered 2020-11-09 – 2020-11-10 (×2): 2 mg via ORAL
  Filled 2020-11-08 (×2): qty 1

## 2020-11-08 MED ORDER — TRAVASOL 10 % IV SOLN
INTRAVENOUS | Status: AC
  Filled 2020-11-08: qty 846

## 2020-11-08 MED ORDER — SODIUM CHLORIDE 0.9% IV SOLUTION: Freq: Once | INTRAVENOUS | Status: AC

## 2020-11-08 MED ORDER — CHLORHEXIDINE GLUCONATE CLOTH 2 % EX PADS
6.0000 | MEDICATED_PAD | Freq: Every day | CUTANEOUS | Status: DC
  Administered 2020-11-08 – 2020-11-10 (×3): 6 via TOPICAL

## 2020-11-08 MED ORDER — POTASSIUM PHOSPHATES 15 MMOLE/5ML IV SOLN
15.0000 mmol | Freq: Once | INTRAVENOUS | Status: AC
  Administered 2020-11-08: 15 mmol via INTRAVENOUS
  Filled 2020-11-08: qty 5

## 2020-11-08 MED ORDER — DIPHENOXYLATE-ATROPINE 2.5-0.025 MG/5ML PO LIQD
5.0000 mL | Freq: Four times a day (QID) | ORAL | Status: DC | PRN
  Filled 2020-11-08 (×2): qty 5

## 2020-11-08 NOTE — Progress Notes (Signed)
[facility] Infect[facility]   Reason for visit: Follow up on fever  Interval History: overnight had a high fever of 103, felt bad with it.  Blood culture now growing GNR in one bottle.   levafloxacin day 2  Physical Exam: Constitutional:  Vitals:   11/08/20 1038 11/08/20 1308  BP: (!) 135/59 (!) 130/58  Pulse: (!) 57 (!) 56  Resp: 18   Temp: 98.3 F (36.8 C) 98.8 F (37.1 C)  SpO2: 98% 96%   patient appears in NAD Respiratory: Normal respiratory effort; CTA B Cardiovascular: RRR GI: soft, nt, nd  Review of Systems: Constitutional: positive for fevers, chills and malaise Gastrointestinal: negative for nausea and diarrhea Integument/breast: negative for rash  Lab Results  Component Value Date   WBC 8.2 11/08/2020   HGB 7.4 (L) 11/08/2020   HCT 22.8 (L) 11/08/2020   MCV 90.5 11/08/2020   PLT 104 (L) 11/08/2020    Lab Results  Component Value Date   CREATININE 1.05 (H) 11/08/2020   BUN 23 (H) 11/08/2020   NA 137 11/08/2020   K 3.8 11/08/2020   CL 103 11/08/2020   CO2 27 11/08/2020    Lab Results  Component Value Date   ALT 154 (H) 11/08/2020   AST 47 (H) 11/08/2020   ALKPHOS 187 (H) 11/08/2020     Microbiology: Recent Results (from the past 240 hour(s))  Urine culture     Status: None   Collection Time: 11/06/20 11:29 PM   Specimen: In/Out Cath Urine  Result Value Ref Range Status   Specimen Description   Final    IN/OUT CATH URINE Performed at [facility], [place] [address], [place], [place] [address]    Special Requests   Final    NONE Performed at [facility], [place] [address], [place], [place] [address]    Culture   Final    NO GROWTH Performed at [facility], [place] [address], [place], [place] [address]    Report Status 11/08/2020 FINAL  Final  Blood Culture (routine x 2)     Status: None (Preliminary result)   Collection Time: 11/06/20 11:55 PM   Specimen: BLOOD  Result Value Ref Range  Status   Specimen Description   Final    BLOOD RIGHT ANTECUBITAL Performed at [facility], [place] [address], [place], [place] [address]    Special Requests   Final    BOTTLES DRAWN AEROBIC AND ANAEROBIC Blood Culture adequate volume Performed at [place] [address] [place], [place] [address]    Culture  Setup Time   Final    GRAM NEGATIVE RODS AEROBIC BOTTLE ONLY CRITICAL RESULT CALLED TO, READ BACK BY AND VERIFIED WITH: M. Adell, AT 1244 11/08/20 BY Rush Landmark Performed at [facility], [place] [address] [place], [place] [address]    Culture GRAM NEGATIVE RODS  Final   Report Status PENDING  Incomplete  Resp Panel by RT-PCR (Flu A&B, Covid) Nasopharyngeal Swab     Status: None   Collection Time: 11/07/20  1:46 AM   Specimen: Nasopharyngeal Swab; Nasopharyngeal(NP) swabs in vial transport medium  Result Value Ref Range Status   SARS Coronavirus 2 by RT PCR NEGATIVE NEGATIVE Final    Comment: (NOTE) SARS-CoV-2 target nucleic acids are NOT DETECTED.  The SARS-CoV-2 RNA is generally detectable in upper respiratory specimens during the acute phase of infection. The lowest concentration of SARS-CoV-2 viral copies this  assay can detect is 138 copies/mL. A negative result does not preclude SARS-Cov-2 infection and should not be used as the sole basis for treatment or other patient management decisions. A negative result may occur with  improper specimen collection/handling, submission of specimen other than nasopharyngeal swab, presence of viral mutation(s) within the areas targeted by this assay, and inadequate number of viral copies(<138 copies/mL). A negative result must be combined with clinical observations, patient history, and epidemiological information. The expected result is Negative.  Fact Sheet for Patients:  EntrepreneurPulse.com.au  Fact Sheet for Healthcare Providers:   IncredibleEmployment.be  This test is no t yet approved or cleared by the [place] FDA and  has been authorized for detection and/or diagnosis of SARS-CoV-2 by FDA under an Emergency Use Authorization (EUA). This EUA will remain  in effect (meaning this test can be used) for the duration of the COVID-19 declaration under Section 564(b)(1) of the Act, 21 U.S.C.section 360bbb-3(b)(1), unless the authorization is terminated  or revoked sooner.       Influenza A by PCR NEGATIVE NEGATIVE Final   Influenza B by PCR NEGATIVE NEGATIVE Final    Comment: (NOTE) The Xpert Xpress SARS-CoV-2/FLU/RSV plus assay is intended as an aid in the diagnosis of influenza from Nasopharyngeal swab specimens and should not be used as a sole basis for treatment. Nasal washings and aspirates are unacceptable for Xpert Xpress SARS-CoV-2/FLU/RSV testing.  Fact Sheet for Patients: EntrepreneurPulse.com.au  Fact Sheet for Healthcare Providers: IncredibleEmployment.be  This test is not yet approved or cleared by the [place] FDA and has been authorized for detection and/or diagnosis of SARS-CoV-2 by FDA under an Emergency Use Authorization (EUA). This EUA will remain in effect (meaning this test can be used) for the duration of the COVID-19 declaration under Section 564(b)(1) of the Act, 21 U.S.C. section 360bbb-3(b)(1), unless the authorization is terminated or revoked.  Performed at [facility], [place] [address], [place], [place] [address]     Impression/Plan:  1. GNR bacteremia - now again growing in the blood culture in the setting of her continued line.  She has a history of Stenotrophomonas and Enterobacter.  Will monitor and she is on levaquin which will cover both.    2. Fever - as above, likely from bacteremia.

## 2020-11-08 NOTE — Progress Notes (Signed)
PROGRESS NOTE  Caitlin Peters BHA:[id number] DOB: 03/15/1961 DOA: 11/06/2020 PCP: [name], [name], MD   LOS: 1 day   Brief Narrative / Interim history: 59 year old female with Crohn's disease, multiple surgeries in the past with short gut syndrome, chronic PICC line with TPN, recurrent PICC line infections comes to the hospital with weakness and fever.  There is concern about recurrent bacteremia/PICC line infection.  Placed on intravenous antibiotics, cultures are pending.  ID consulted.  Subjective / 24h Interval events: Felt quite poorly last night, had a high fever up to 103, complains of having significant chills at that time.  No abdominal pain, no nausea or vomiting.  She complains of diarrhea this morning which is chronic for her and asks for Imodium and Lomotil.  Assessment & Plan: Principal Problem Febrile illness -Patient here with fever, not tachycardic, normotensive, normal WBC.  She has a history of recurrent PICC line infections and currently has a chronic PICC line in place however it looks good on exam.  Infectious disease consulted. -Blood cultures have remained negative to date. -Obtain D-dimer, if elevated will do a lower extremity Doppler  -Had recurrent fever last night, not ready for discharge, continue to monitor cultures and continue antibiotics with Levaquin as per ID.  Active Problems Crohn's disease, short gut syndrome with multiple surgeries in the past, chronic TPN, chronic diarrhea -Continue TPN per pharmacy.  Add Imodium and Lomotil as per her request for chronic diarrhea.  Thrombocytopenia -Possibly secondary to infectious source, follow CBC  History of hypertension -Continue home medications with Norvasc, metoprolol, blood pressure stable  Chronic pain -Continue methadone, Dilaudid.  She has poor oral obstruction and is on intravenous medications  Chronic anemia from chronic disease -Follow CBC, no bleeding  Chronic kidney disease  stage IIIa -Baseline about 1.0-1.3, currently at baseline  Scheduled Meds: . sodium chloride   Intravenous Once  . amLODipine  10 mg Oral Daily  . budesonide  6 mg Oral Daily  . buPROPion  200 mg Oral Daily  . Chlorhexidine Gluconate Cloth  6 each Topical Daily  . cholecalciferol  1,000 Units Oral Daily  . cycloSPORINE  1 drop Both Eyes BID  . DULoxetine  90 mg Oral Daily  . enoxaparin (LOVENOX) injection  40 mg Subcutaneous Q24H  . famotidine  20 mg Oral QHS  . hydrALAZINE  50 mg Oral TID  . insulin aspart  0-9 Units Subcutaneous Q8H  . methadone  5 mg Oral 5 X Daily  . metoprolol tartrate  100 mg Oral BID  . multivitamin with minerals  1 tablet Oral Daily  . pantoprazole  40 mg Oral Daily  . polyvinyl alcohol  1 drop Both Eyes QID  . potassium chloride SA  40 mEq Oral Daily  . sodium chloride flush  10-40 mL Intracatheter Q12H  . sucralfate  1 g Oral TID WC & HS  . vitamin B-12  1,000 mcg Oral Daily   Continuous Infusions: . levofloxacin (LEVAQUIN) IV 500 mg (11/07/20 1826)  . potassium PHOSPHATE IVPB (in mmol) 15 mmol (11/08/20 0837)  . TPN CYCLIC-ADULT (ION)     PRN Meds:.acetaminophen **OR** acetaminophen, diphenoxylate-atropine, HYDROmorphone (DILAUDID) injection, lipase/protease/amylase, loperamide, promethazine, sodium chloride flush, traZODone  Diet Orders (From admission, onward)    Start     Ordered   11/07/20 0249  Diet Heart Room service appropriate? Yes; Fluid consistency: Thin  Diet effective now       Question Answer Comment  Room service appropriate? Yes  Fluid consistency: Thin      11/07/20 0249          DVT prophylaxis: enoxaparin (LOVENOX) injection 40 mg Start: 11/07/20 0800     Code Status: Full Code  Family Communication: No family at bedside  Status is: Inpatient  Remains inpatient appropriate because:Persistent fever, intravenous antibiotics, cultures pending   Dispo: The patient is from: Home              Anticipated d/c is to:  Home              Anticipated d/c date is: 2 days              Patient currently is not medically stable to d/c.  Consultants:  None   Procedures:  None   Microbiology  Blood cultures-no growth to date  Antimicrobials: Levaquin 12/13 >>    Objective: Vitals:   11/07/20 2236 11/07/20 2334 11/08/20 0133 11/08/20 0541  BP:  (!) 151/69 (!) 149/68 (!) 150/68  Pulse:  75 71 (!) 40  Resp:  18 15 18   Temp: (!) 103.1 F (39.5 C) 100.3 F (37.9 C) 99.1 F (37.3 C) 98.4 F (36.9 C)  TempSrc: Oral Oral Oral Oral  SpO2:  100% 100% 100%  Weight:      Height:        Intake/Output Summary (Last 24 hours) at 11/08/2020 0955 Last data filed at 11/07/2020 1700 Gross per 24 hour  Intake 2617.52 ml  Output --  Net 2617.52 ml   Filed Weights   11/07/20 0005  Weight: 61.2 kg    Examination:  Constitutional: NAD Eyes: no scleral icterus ENMT: Mucous membranes are moist.  Neck: normal, supple Respiratory: clear to auscultation bilaterally, no wheezing, no crackles. Normal respiratory effort. Cardiovascular: Regular rate and rhythm, no murmurs / rubs / gallops. No LE edema.  Abdomen: non distended, no tenderness. Bowel sounds positive.  Musculoskeletal: no clubbing / cyanosis.  Skin: no rashes Neurologic: CN 2-12 grossly intact. Strength 5/5 in all 4.   Data Reviewed: I have independently reviewed following labs and imaging studies   CBC: Recent Labs  Lab 11/06/20 2329 11/07/20 0350 11/08/20 0530  WBC 7.2 7.1 8.2  NEUTROABS 6.0 5.7 6.9  HGB 8.2* 7.6* 7.4*  HCT 25.0* 23.0* 22.8*  MCV 93.6 92.7 90.5  PLT 125* 120* 779*   Basic Metabolic Panel: Recent Labs  Lab 11/06/20 2329 11/07/20 0350 11/08/20 0530  NA 137 135 137  K 3.6 3.3* 3.8  CL 109 107 103  CO2 20* 19* 27  GLUCOSE 101* 97 115*  BUN 22* 19 23*  CREATININE 1.29* 1.12* 1.05*  CALCIUM 8.5* 8.4* 8.7*  MG  --  1.6* 1.9  PHOS  --  2.7 1.6*   Liver Function Tests: Recent Labs  Lab 11/06/20 2329  11/07/20 0350 11/08/20 0530  AST 142* 109* 47*  ALT 287* 238* 154*  ALKPHOS 195* 170* 187*  BILITOT 0.6 0.7 0.8  PROT 6.6 5.9* 5.7*  ALBUMIN 3.1* 2.7* 2.5*   Coagulation Profile: Recent Labs  Lab 11/06/20 2355  INR 1.1   HbA1C: No results for input(s): HGBA1C in the last 72 hours. CBG: Recent Labs  Lab 11/07/20 1655 11/07/20 2344 11/08/20 0743  GLUCAP 88 138* 97    Recent Results (from the past 240 hour(s))  Urine culture     Status: None   Collection Time: 11/06/20 11:29 PM   Specimen: In/Out Cath Urine  Result Value Ref Range Status  Specimen Description   Final    IN/OUT CATH URINE Performed at [facility] [address] [place], [place] [address]    Special Requests   Final    NONE Performed at [facility], [place] [address], [place], [place] [address]    Culture   Final    NO GROWTH Performed at [facility], [place] [address], [place], [place] [address]    Report Status 11/08/2020 FINAL  Final  Blood Culture (routine x 2)     Status: None (Preliminary result)   Collection Time: 11/06/20 11:55 PM   Specimen: BLOOD  Result Value Ref Range Status   Specimen Description   Final    BLOOD RIGHT ANTECUBITAL Performed at [facility], [place] [address] [place], [place] [address]    Special Requests   Final    BOTTLES DRAWN AEROBIC AND ANAEROBIC Blood Culture adequate volume Performed at [place] [facility] [address] [place], [place] [address]    Culture  Setup Time PENDING  Incomplete   Culture PENDING  Incomplete   Report Status PENDING  Incomplete  Resp Panel by RT-PCR (Flu A&B, Covid) Nasopharyngeal Swab     Status: None   Collection Time: 11/07/20  1:46 AM   Specimen: Nasopharyngeal Swab; Nasopharyngeal(NP) swabs in vial transport medium  Result Value Ref Range Status   SARS Coronavirus 2 by RT PCR NEGATIVE NEGATIVE Final    Comment: (NOTE) SARS-CoV-2 target nucleic acids are NOT  DETECTED.  The SARS-CoV-2 RNA is generally detectable in upper respiratory specimens during the acute phase of infection. The lowest concentration of SARS-CoV-2 viral copies this assay can detect is 138 copies/mL. A negative result does not preclude SARS-Cov-2 infection and should not be used as the sole basis for treatment or other patient management decisions. A negative result may occur with  improper specimen collection/handling, submission of specimen other than nasopharyngeal swab, presence of viral mutation(s) within the areas targeted by this assay, and inadequate number of viral copies(<138 copies/mL). A negative result must be combined with clinical observations, patient history, and epidemiological information. The expected result is Negative.  Fact Sheet for Patients:  EntrepreneurPulse.com.au  Fact Sheet for Healthcare Providers:  IncredibleEmployment.be  This test is no t yet approved or cleared by the [place] FDA and  has been authorized for detection and/or diagnosis of SARS-CoV-2 by FDA under an Emergency Use Authorization (EUA). This EUA will remain  in effect (meaning this test can be used) for the duration of the COVID-19 declaration under Section 564(b)(1) of the Act, 21 U.S.C.section 360bbb-3(b)(1), unless the authorization is terminated  or revoked sooner.       Influenza A by PCR NEGATIVE NEGATIVE Final   Influenza B by PCR NEGATIVE NEGATIVE Final    Comment: (NOTE) The Xpert Xpress SARS-CoV-2/FLU/RSV plus assay is intended as an aid in the diagnosis of influenza from Nasopharyngeal swab specimens and should not be used as a sole basis for treatment. Nasal washings and aspirates are unacceptable for Xpert Xpress SARS-CoV-2/FLU/RSV testing.  Fact Sheet for Patients: EntrepreneurPulse.com.au  Fact Sheet for Healthcare Providers: IncredibleEmployment.be  This test is not yet  approved or cleared by the [place] FDA and has been authorized for detection and/or diagnosis of SARS-CoV-2 by FDA under an Emergency Use Authorization (EUA). This EUA will remain in effect (meaning this test can be used) for the duration of the COVID-19 declaration under Section 564(b)(1) of the Act, 21 U.S.C. section  360bbb-3(b)(1), unless the authorization is terminated or revoked.  Performed at [facility], [place] [address], [place], [place] [address]      Radiology Studies: No results found.   Marzetta Board, MD, PhD [facility]  Between 7 am - 7 pm I am available, please contact me via Amion or Securechat  Between 7 pm - 7 am I am not available, please contact night coverage MD/APP via Amion

## 2020-11-08 NOTE — Progress Notes (Signed)
Right lower extremity venous duplex has been completed. Preliminary results can be found in CV Proc through chart review.   11/08/20 3:07 PM Caitlin Peters RVT

## 2020-11-08 NOTE — Progress Notes (Signed)
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  There is concern about recurrent bacteremia/PICC line infection.  Patient in room sitting on bedside commode. Pt pleasant and states she feels better today even though she still had a fever last night.   Pt currently receiving cyclic TPN that provides 1350 kcals and 85g protein.  Pt states she only has fevers when she infuses her TPN. States she eats but doesn't have much of an appetite. Her daughter cooks for her foods that she enjoys. This includes protein foods such as fish and chicken. Pt reports she was told to eat adequate amounts of fats.  Pt does not want any protein supplements. Would like a fruit plate for a snack, RD placed order. Would like more options as well from menu.   Pt reports UBW has been 130-140 lbs.  Per weight records, pt has lost 11 lbs since 8/17 (7% wt loss x 4 months, insignificant for time frame).   Medications: Vitamin D, Multivitamin with minerals daily, KLOR-CON, Carafate, Vitamin B-12, K-Phos, Creon  Labs reviewed: CBGs: 97-138 Low Phos Mg WNL  NUTRITION - FOCUSED PHYSICAL EXAM:  Flowsheet Row Most Recent Value  Orbital Region Mild depletion  Upper Arm Region Mild depletion  Thoracic and Lumbar Region Unable to assess  Buccal Region Mild depletion   Temple Region Mild depletion  Clavicle Bone Region Mild depletion  Clavicle and Acromion Bone Region No depletion  Scapular Bone Region No depletion  Dorsal Hand No depletion  Patellar Region Unable to assess  Anterior Thigh Region Unable to assess  Posterior Calf Region Unable to assess  Edema (RD Assessment) None  Hair Reviewed  Eyes Reviewed  Mouth Reviewed  Skin Reviewed  Nails Reviewed       Diet Order:   Diet Order            Diet Heart Room service appropriate? Yes; Fluid consistency: Thin  Diet effective now                 EDUCATION NEEDS:   No education needs have been identified at this time  Skin:  Skin Assessment: Reviewed RN Assessment  Last BM:  12/14  Height:   Ht Readings from Last 1 Encounters:  11/07/20 5' 8"  (1.727 m)    Weight:   Wt Readings from Last 1 Encounters:  11/07/20 61.2 kg   BMI:  Body mass index is 20.51 kg/m.  Estimated Nutritional Needs:   Kcal:  1850-2050  Protein:  85-100g  Fluid:  2L/day   Clayton Bibles, MS, RD, LDN Inpatient Clinical Dietitian Contact information available via Amion

## 2020-11-08 NOTE — Progress Notes (Signed)
PHARMACY - [facility] NUTRITION CONSULT NOTE   Indication: Chronic TPN, hx Crohn's disease with short gut syndrome  Patient Measurements: Height: 5' 8"  (172.7 cm) Weight: 61.2 kg (134 lb 14.7 oz) IBW/kg (Calculated) : 63.9 TPN AdjBW (KG): 61.2 Body mass index is 20.51 kg/m.  Assessment: Re-admitted on 12/13 with fever and chills.  History of recurrent PICC line infections.  Pharmacy is consulted to continue dosing chronic TPN while admitted.  Glucose / Insulin: CBGs 115-138 while TPN infusing.  Previously stable on chronic TPN without use of scheduled or SSI.  Goal CBGs 100-150. Electrolytes: K 3.8 after 3 runs yesterday + Kdur 60mq PO daily, Mag low at 1.9 after 1g replacement yesterday.  Phos low at 1.6, Na/Cl/CO2 WNL, CorrCa 9.9 Renal: SCr 1.05, appears close to baseline ~1.  BUN 23. Urine x 1. LFTs / TGs: elevated/improving, AST/ALT 47/154, AlkPhos 187, TG 31 Prealbumin / albumin: Prealbumin pending, Albumin 2.5  Intake / Output; MIVF: LR d/c'ed.  I/O not recorded accurately. GI Imaging: n/a Surgeries / Procedures: n/a  Central access: Femoral CVC - double lumen (08/05/20) TPN start date: chronic, PTA  Nutritional Goals (per RD recommendation pending) Inpatient cyclic TPN goal volume is 1800 ml/day and provides:  85 g protein, Kcal 1350   Home TPN formula: 1800 mL cycled over 12 hours to provide 85g protein and 1521 kcal with lipids 3x per week; without lipids 4x per week provides 85g protein and and 1121 kcal.  Average daily protein 85g and 1292 kcal.  Includes: KCl 75 mEq; Kphos 30 mmol/day, NaCl 90 mEq; NaAc 45 mEq, Mag sulfate 16 mEq  Current Nutrition:  TPN Cardiac Diet  Plan:  Now: KPhos 15 mmol IV x 1 (KCl 40 mEq PO daily ordered by MD from home meds)  At 18:00 Continue cycled TPN 1800 mL/day over 12 hours starting at 1800 Electrolytes in TPN:   130 mEq/L of Na  545m/L of K  5 mEq/L of Ca   7.5 mEq/L of Mg  Increase 15 mmol/L of Phos Cl:Ac  ratio, Max Acetate Will not add standard MVI and trace elements to TPN, already receiving PO Initiate Sensitive q8h SSI and adjust as needed  Monitor TPN labs on Mon/Thurs.  CMET and Phos in AM.  ErPeggyann JubaPharmD, BCPS [facility] main pharmacy [id number]/14/2021 7:06 AM

## 2020-11-09 DIAGNOSIS — N289 Disorder of kidney and ureter, unspecified: Secondary | ICD-10-CM | POA: Diagnosis not present

## 2020-11-09 DIAGNOSIS — T827XXD Infection and inflammatory reaction due to other cardiac and vascular devices, implants and grafts, subsequent encounter: Secondary | ICD-10-CM

## 2020-11-09 DIAGNOSIS — T80211A Bloodstream infection due to central venous catheter, initial encounter: Secondary | ICD-10-CM | POA: Diagnosis not present

## 2020-11-09 LAB — GLUCOSE, CAPILLARY
Glucose-Capillary: 100 mg/dL — ABNORMAL HIGH (ref 70–99)
Glucose-Capillary: 107 mg/dL — ABNORMAL HIGH (ref 70–99)
Glucose-Capillary: 117 mg/dL — ABNORMAL HIGH (ref 70–99)

## 2020-11-09 LAB — COMPREHENSIVE METABOLIC PANEL
ALT: 118 U/L — ABNORMAL HIGH (ref 0–44)
AST: 28 U/L (ref 15–41)
Albumin: 2.7 g/dL — ABNORMAL LOW (ref 3.5–5.0)
Alkaline Phosphatase: 194 U/L — ABNORMAL HIGH (ref 38–126)
Anion gap: 7 (ref 5–15)
BUN: 25 mg/dL — ABNORMAL HIGH (ref 6–20)
CO2: 35 mmol/L — ABNORMAL HIGH (ref 22–32)
Calcium: 9 mg/dL (ref 8.9–10.3)
Chloride: 97 mmol/L — ABNORMAL LOW (ref 98–111)
Creatinine, Ser: 0.98 mg/dL (ref 0.44–1.00)
GFR, Estimated: >60 mL/min (ref >60)
Glucose, Bld: 121 mg/dL — ABNORMAL HIGH (ref 70–99)
Potassium: 3.5 mmol/L (ref 3.5–5.1)
Sodium: 139 mmol/L (ref 135–145)
Total Bilirubin: 0.7 mg/dL (ref 0.3–1.2)
Total Protein: 6.4 g/dL — ABNORMAL LOW (ref 6.5–8.1)

## 2020-11-09 LAB — CBC
HCT: 27.3 % — ABNORMAL LOW (ref 36.0–46.0)
Hemoglobin: 9.2 g/dL — ABNORMAL LOW (ref 12.0–15.0)
MCH: 30.3 pg (ref 26.0–34.0)
MCHC: 33.7 g/dL (ref 30.0–36.0)
MCV: 89.8 fL (ref 80.0–100.0)
Platelets: 121 10*3/uL — ABNORMAL LOW (ref 150–400)
RBC: 3.04 MIL/uL — ABNORMAL LOW (ref 3.87–5.11)
RDW: 14.6 % (ref 11.5–15.5)
WBC: 5.2 10*3/uL (ref 4.0–10.5)
nRBC: 0 % (ref 0.0–0.2)

## 2020-11-09 LAB — BPAM RBC
Blood Product Expiration Date: 202201162359
ISSUE DATE / TIME: 202112141316
Unit Type and Rh: 5100

## 2020-11-09 LAB — TYPE AND SCREEN
ABO/RH(D): O POS
Antibody Screen: NEGATIVE
Unit division: 0

## 2020-11-09 LAB — PHOSPHORUS: Phosphorus: 2.3 mg/dL — ABNORMAL LOW (ref 2.5–4.6)

## 2020-11-09 LAB — MAGNESIUM: Magnesium: 2.1 mg/dL (ref 1.7–2.4)

## 2020-11-09 MED ORDER — TRAVASOL 10 % IV SOLN
INTRAVENOUS | Status: AC
  Filled 2020-11-09: qty 846

## 2020-11-09 MED ORDER — PROMETHAZINE HCL 25 MG/ML IJ SOLN
25.0000 mg | Freq: Four times a day (QID) | INTRAMUSCULAR | Status: DC | PRN
  Administered 2020-11-09 – 2020-11-10 (×3): 25 mg via INTRAVENOUS
  Filled 2020-11-09 (×3): qty 1

## 2020-11-09 MED ORDER — POTASSIUM PHOSPHATES 15 MMOLE/5ML IV SOLN
15.0000 mmol | Freq: Once | INTRAVENOUS | Status: AC
  Administered 2020-11-09: 15 mmol via INTRAVENOUS
  Filled 2020-11-09: qty 5

## 2020-11-09 MED ORDER — ESTRADIOL 1 MG PO TABS
2.0000 mg | ORAL_TABLET | Freq: Every day | ORAL | Status: DC
Start: 2020-11-09 — End: 2020-11-10
  Administered 2020-11-09 – 2020-11-10 (×2): 2 mg via ORAL
  Filled 2020-11-09 (×2): qty 2

## 2020-11-09 NOTE — Progress Notes (Signed)
[facility] for Infectious Disease   Reason for visit: fever  Interval History: no fever now over 24 hours.  Feels well, eating well.   levafloxacin day 3  Physical Exam: Constitutional:  Vitals:   11/09/20 0508 11/09/20 1437  BP: (!) 161/79 (!) 158/66  Pulse: (!) 109 (!) 51  Resp: 14 16  Temp: 98.6 F (37 C) (!) 97.5 F (36.4 C)  SpO2: 98% 100%   she is in NAD Cardiovascular: RRR Pulmonary: CTA B  Review of Systems: Constitutional: negative for fever, no chills Gastrointestinal: negative for diarrhea Integument/breast: negative for rash  Lab Results  Component Value Date   WBC 5.2 11/09/2020   HGB 9.2 (L) 11/09/2020   HCT 27.3 (L) 11/09/2020   MCV 89.8 11/09/2020   PLT 121 (L) 11/09/2020    Lab Results  Component Value Date   CREATININE 0.98 11/09/2020   BUN 25 (H) 11/09/2020   NA 139 11/09/2020   K 3.5 11/09/2020   CL 97 (L) 11/09/2020   CO2 35 (H) 11/09/2020    Lab Results  Component Value Date   ALT 118 (H) 11/09/2020   AST 28 11/09/2020   ALKPHOS 194 (H) 11/09/2020     Microbiology: Recent Results (from the past 240 hour(s))  Urine culture     Status: None   Collection Time: 11/06/20 11:29 PM   Specimen: In/Out Cath Urine  Result Value Ref Range Status   Specimen Description   Final    IN/OUT CATH URINE Performed at [facility], [place] [address], [place], [place] [address]    Special Requests   Final    NONE Performed at [facility], [place] [address] [place], [place] [address]    Culture   Final    NO GROWTH Performed at [facility], [place] [address] [place], [place] [address]    Report Status 11/08/2020 FINAL  Final  Blood Culture (routine x 2)     Status: None (Preliminary result)   Collection Time: 11/06/20 11:55 PM   Specimen: BLOOD  Result Value Ref Range Status   Specimen Description   Final    BLOOD RIGHT ANTECUBITAL Performed at [facility], [place] [address],  [place], [place] [address]    Special Requests   Final    BOTTLES DRAWN AEROBIC AND ANAEROBIC Blood Culture adequate volume Performed at [place] [address] [place], [place] [address]    Culture  Setup Time   Final    GRAM NEGATIVE RODS AEROBIC BOTTLE ONLY CRITICAL RESULT CALLED TO, READ BACK BY AND VERIFIED WITH: [place], AT 1244 11/08/20 BY D. VANHOOK    Culture   Final    Lonell Grandchild NEGATIVE RODS IDENTIFICATION AND SUSCEPTIBILITIES TO FOLLOW Performed at [facility], [place] [address], [place], [place] [address]    Report Status PENDING  Incomplete  Resp Panel by RT-PCR (Flu A&B, Covid) Nasopharyngeal Swab     Status: None   Collection Time: 11/07/20  1:46 AM   Specimen: Nasopharyngeal Swab; Nasopharyngeal(NP) swabs in vial transport medium  Result Value Ref Range Status   SARS Coronavirus 2 by RT PCR NEGATIVE NEGATIVE Final    Comment: (NOTE) SARS-CoV-2 target nucleic acids are NOT DETECTED.  The SARS-CoV-2 RNA is generally detectable in upper respiratory specimens during the acute phase of infection. The lowest concentration of SARS-CoV-2 viral copies this assay can detect is 138 copies/mL. A negative result does not  preclude SARS-Cov-2 infection and should not be used as the sole basis for treatment or other patient management decisions. A negative result may occur with  improper specimen collection/handling, submission of specimen other than nasopharyngeal swab, presence of viral mutation(s) within the areas targeted by this assay, and inadequate number of viral copies(<138 copies/mL). A negative result must be combined with clinical observations, patient history, and epidemiological information. The expected result is Negative.  Fact Sheet for Patients:  EntrepreneurPulse.com.au  Fact Sheet for Healthcare Providers:  IncredibleEmployment.be  This test is no t yet approved or cleared by the [place] FDA and  has been authorized for detection and/or diagnosis of SARS-CoV-2 by FDA under an Emergency Use Authorization (EUA). This EUA will remain  in effect (meaning this test can be used) for the duration of the COVID-19 declaration under Section 564(b)(1) of the Act, 21 U.S.C.section 360bbb-3(b)(1), unless the authorization is terminated  or revoked sooner.       Influenza A by PCR NEGATIVE NEGATIVE Final   Influenza B by PCR NEGATIVE NEGATIVE Final    Comment: (NOTE) The Xpert Xpress SARS-CoV-2/FLU/RSV plus assay is intended as an aid in the diagnosis of influenza from Nasopharyngeal swab specimens and should not be used as a sole basis for treatment. Nasal washings and aspirates are unacceptable for Xpert Xpress SARS-CoV-2/FLU/RSV testing.  Fact Sheet for Patients: EntrepreneurPulse.com.au  Fact Sheet for Healthcare Providers: IncredibleEmployment.be  This test is not yet approved or cleared by the [place] FDA and has been authorized for detection and/or diagnosis of SARS-CoV-2 by FDA under an Emergency Use Authorization (EUA). This EUA will remain in effect (meaning this test can be used) for the duration of the COVID-19 declaration under Section 564(b)(1) of the Act, 21 U.S.C. section 360bbb-3(b)(1), unless the authorization is terminated or revoked.  Performed at [facility], [place] [address] [place], [name] [address]     Impression/Plan:  1.GNR bacteremia - slow and limited growth so micro was not able to do a BCID on it.  Will wait for ID by culture growth, likely tomorrow and then will determine the treatment based on the organism and if any intervention such as line exchange indicated.   2. Fever - no further fever for 24 hours.  Will continue to monitor.    3.  Access - line in her groin is likely her remaining, last line.  Will treat through.

## 2020-11-09 NOTE — Progress Notes (Addendum)
PROGRESS NOTE    Caitlin Peters  GYF:[id number] DOB: 07-31-1961 DOA: 11/06/2020 PCP: [name], [name], MD     Brief Narrative:  Caitlin Peters is a 59 year old female with Crohn's disease, multiple surgeries in the past with short gut syndrome, chronic PICC line with TPN use with history of recurrent PICC line infection who comes to the hospital with complaints of weakness and fever.  She was started on IV antibiotics, blood cultures obtained.  Infectious disease consulted.  New events last 24 hours / Subjective: Feeling better this morning.  Afebrile last night.  Assessment & Plan:   Principal Problem:   Bacteremia associated with intravascular line ([place]) Active Problems:   Short gut syndrome   HTN (hypertension), benign   Chronic pain syndrome   On total parenteral nutrition (TPN)   Gram-negative rod bacteremia -Sepsis ruled out at time of admission -Has history of Stenotrophomonas and Enterobacter, recurrent PICC line infections  -Blood culture showing GNR, await identification and susceptibilities  -Infectious disease following -Continue Levaquin  Crohn's disease, short gut syndrome with multiple surgeries in the past, chronic TPN -Continue budesonide, [place], TPN per pharmacy  Hypertension -Continue Norvasc, Lopressor  Chronic pain -Continue methadone, Dilaudid  Depression/anxiety -Continue Wellbutrin, Cymbalta     DVT prophylaxis:  enoxaparin (LOVENOX) injection 40 mg Start: 11/07/20 0800  Code Status: Full code Family Communication: No family at bedside Disposition Plan:  Status is: Inpatient  Remains inpatient appropriate because:Inpatient level of care appropriate due to severity of illness   Dispo: The patient is from: Home              Anticipated d/c is to: Home              Anticipated d/c date is: 1 day              Patient currently is not medically stable to d/c.  Await final blood culture identification.  Infectious disease  recommendation pending prior to discharge home   Consultants:   ID  Procedures:   None  Antimicrobials:  Anti-infectives (From admission, onward)   Start     Dose/Rate Route Frequency Ordered Stop   11/08/20 0200  vancomycin (VANCOCIN) IVPB 1000 mg/200 mL premix  Status:  Discontinued        1,000 mg 200 mL/hr over 60 Minutes Intravenous Every 24 hours 11/07/20 0141 11/07/20 1617   11/07/20 1715  levofloxacin (LEVAQUIN) IVPB 500 mg        500 mg 100 mL/hr over 60 Minutes Intravenous Every 24 hours 11/07/20 1617     11/07/20 0200  vancomycin (VANCOREADY) IVPB 1250 mg/250 mL        1,250 mg 166.7 mL/hr over 90 Minutes Intravenous  Once 11/07/20 0020 11/07/20 0352   11/07/20 0100  ceFEPIme (MAXIPIME) 2 g in sodium chloride 0.9 % 100 mL IVPB  Status:  Discontinued        2 g 200 mL/hr over 30 Minutes Intravenous Every 12 hours 11/07/20 0020 11/07/20 1617   11/07/20 0000  aztreonam (AZACTAM) 2 g in sodium chloride 0.9 % 100 mL IVPB  Status:  Discontinued        2 g 200 mL/hr over 30 Minutes Intravenous  Once 11/06/20 2356 11/07/20 0018   11/07/20 0000  vancomycin (VANCOCIN) IVPB 1000 mg/200 mL premix  Status:  Discontinued        1,000 mg 200 mL/hr over 60 Minutes Intravenous  Once 11/06/20 2356 11/07/20 0020   11/07/20  0000  metroNIDAZOLE (FLAGYL) tablet 500 mg  Status:  Discontinued        500 mg Oral Every 8 hours 11/06/20 2356 11/07/20 1617        Objective: Vitals:   11/08/20 1340 11/08/20 1543 11/08/20 2125 11/09/20 0508  BP: (!) 138/58 (!) 143/67 (!) 154/67 (!) 161/79  Pulse: (!) 57 (!) 102 (!) 108 (!) 109  Resp: 15  18 14   Temp: 98.8 F (37.1 C) 98.6 F (37 C) 98.6 F (37 C) 98.6 F (37 C)  TempSrc: Oral Oral Oral Oral  SpO2: 98% 98% 93% 98%  Weight:      Height:        Intake/Output Summary (Last 24 hours) at 11/09/2020 1414 Last data filed at 11/09/2020 0300 Gross per 24 hour  Intake 957.9 ml  Output 300 ml  Net 657.9 ml   Filed Weights   11/07/20  0005  Weight: 61.2 kg    Examination:  General exam: Appears calm and comfortable  Respiratory system: Clear to auscultation. Respiratory effort normal. No respiratory distress. No conversational dyspnea.  Cardiovascular system: S1 & S2 heard, RRR. No murmurs. No pedal edema. Gastrointestinal system: Abdomen is nondistended, soft and nontender. Normal bowel sounds heard. Central nervous system: Alert and oriented. No focal neurological deficits. Speech clear.  Extremities: Symmetric in appearance, right femoral PICC line in place Skin: No rashes, lesions or ulcers on exposed skin  Psychiatry: Judgement and insight appear normal. Mood & affect appropriate.   Data Reviewed: I have personally reviewed following labs and imaging studies  CBC: Recent Labs  Lab 11/06/20 2329 11/07/20 0350 11/08/20 0530 11/09/20 0422  WBC 7.2 7.1 8.2 5.2  NEUTROABS 6.0 5.7 6.9  --   HGB 8.2* 7.6* 7.4* 9.2*  HCT 25.0* 23.0* 22.8* 27.3*  MCV 93.6 92.7 90.5 89.8  PLT 125* 120* 104* 130*   Basic Metabolic Panel: Recent Labs  Lab 11/06/20 2329 11/07/20 0350 11/08/20 0530 11/09/20 0422  NA 137 135 137 139  K 3.6 3.3* 3.8 3.5  CL 109 107 103 97*  CO2 20* 19* 27 35*  GLUCOSE 101* 97 115* 121*  BUN 22* 19 23* 25*  CREATININE 1.29* 1.12* 1.05* 0.98  CALCIUM 8.5* 8.4* 8.7* 9.0  MG  --  1.6* 1.9 2.1  PHOS  --  2.7 1.6* 2.3*   GFR: Estimated Creatinine Clearance: 59.7 mL/min (by C-G formula based on SCr of 0.98 mg/dL). Liver Function Tests: Recent Labs  Lab 11/06/20 2329 11/07/20 0350 11/08/20 0530 11/09/20 0422  AST 142* 109* 47* 28  ALT 287* 238* 154* 118*  ALKPHOS 195* 170* 187* 194*  BILITOT 0.6 0.7 0.8 0.7  PROT 6.6 5.9* 5.7* 6.4*  ALBUMIN 3.1* 2.7* 2.5* 2.7*   No results for input(s): LIPASE, AMYLASE in the last 168 hours. No results for input(s): AMMONIA in the last 168 hours. Coagulation Profile: Recent Labs  Lab 11/06/20 2355  INR 1.1   Cardiac Enzymes: No results for  input(s): CKTOTAL, CKMB, CKMBINDEX, TROPONINI in the last 168 hours. BNP (last 3 results) No results for input(s): PROBNP in the last 8760 hours. HbA1C: No results for input(s): HGBA1C in the last 72 hours. CBG: Recent Labs  Lab 11/07/20 2344 11/08/20 0743 11/08/20 1838 11/09/20 0009 11/09/20 0736  GLUCAP 138* 97 105* 117* 107*   Lipid Profile: Recent Labs    11/08/20 0530  TRIG 31   Thyroid Function Tests: No results for input(s): TSH, T4TOTAL, FREET4, T3FREE, THYROIDAB in the last 72  hours. Anemia Panel: No results for input(s): VITAMINB12, FOLATE, FERRITIN, TIBC, IRON, RETICCTPCT in the last 72 hours. Sepsis Labs: Recent Labs  Lab 11/06/20 2329  LATICACIDVEN 0.5    Recent Results (from the past 240 hour(s))  Urine culture     Status: None   Collection Time: 11/06/20 11:29 PM   Specimen: In/Out Cath Urine  Result Value Ref Range Status   Specimen Description   Final    IN/OUT CATH URINE Performed at [facility], [place] [address] [place], [place] [address]    Special Requests   Final    NONE Performed at [facility], [place] [address], [place], [place] [address]    Culture   Final    NO GROWTH Performed at [facility], [place] [address], [place], [place] [address]    Report Status 11/08/2020 FINAL  Final  Blood Culture (routine x 2)     Status: None (Preliminary result)   Collection Time: 11/06/20 11:55 PM   Specimen: BLOOD  Result Value Ref Range Status   Specimen Description   Final    BLOOD RIGHT ANTECUBITAL Performed at [facility], [place] [address] [place], [place] [address]    Special Requests   Final    BOTTLES DRAWN AEROBIC AND ANAEROBIC Blood Culture adequate volume Performed at [place] [address] [place], [place] [address]    Culture  Setup Time   Final    GRAM NEGATIVE RODS AEROBIC BOTTLE ONLY CRITICAL RESULT CALLED TO, READ BACK BY AND VERIFIED WITH: [place], AT 1244 11/08/20 BY D. VANHOOK    Culture   Final    Lonell Grandchild NEGATIVE RODS IDENTIFICATION AND SUSCEPTIBILITIES TO FOLLOW Performed at [facility], [place] [address], [place], [place] [address]    Report Status PENDING  Incomplete  Resp Panel by RT-PCR (Flu A&B, Covid) Nasopharyngeal Swab     Status: None   Collection Time: 11/07/20  1:46 AM   Specimen: Nasopharyngeal Swab; Nasopharyngeal(NP) swabs in vial transport medium  Result Value Ref Range Status   SARS Coronavirus 2 by RT PCR NEGATIVE NEGATIVE Final    Comment: (NOTE) SARS-CoV-2 target nucleic acids are NOT DETECTED.  The SARS-CoV-2 RNA is generally detectable in upper respiratory specimens during the acute phase of infection. The lowest concentration of SARS-CoV-2 viral copies this assay can detect is 138 copies/mL. A negative result does not preclude SARS-Cov-2 infection and should not be used as the sole basis for treatment or other patient management decisions. A negative result may occur with  improper specimen collection/handling, submission of specimen other than nasopharyngeal swab, presence of viral mutation(s) within the areas targeted by this assay, and inadequate number of viral copies(<138 copies/mL). A negative result must be combined with clinical observations, patient history, and epidemiological information. The expected result is Negative.  Fact Sheet for Patients:  EntrepreneurPulse.com.au  Fact Sheet for Healthcare Providers:  IncredibleEmployment.be  This test is no t yet approved or cleared by the [place] FDA and  has been authorized for detection and/or diagnosis of SARS-CoV-2 by FDA under an Emergency Use Authorization (EUA). This EUA will remain  in effect (meaning this test can be used) for the duration of the COVID-19 declaration under Section 564(b)(1) of the Act, 21 U.S.C.section 360bbb-3(b)(1), unless the authorization is terminated  or  revoked sooner.       Influenza A by PCR NEGATIVE NEGATIVE Final   Influenza B by PCR  NEGATIVE NEGATIVE Final    Comment: (NOTE) The Xpert Xpress SARS-CoV-2/FLU/RSV plus assay is intended as an aid in the diagnosis of influenza from Nasopharyngeal swab specimens and should not be used as a sole basis for treatment. Nasal washings and aspirates are unacceptable for Xpert Xpress SARS-CoV-2/FLU/RSV testing.  Fact Sheet for Patients: EntrepreneurPulse.com.au  Fact Sheet for Healthcare Providers: IncredibleEmployment.be  This test is not yet approved or cleared by the [place] FDA and has been authorized for detection and/or diagnosis of SARS-CoV-2 by FDA under an Emergency Use Authorization (EUA). This EUA will remain in effect (meaning this test can be used) for the duration of the COVID-19 declaration under Section 564(b)(1) of the Act, 21 U.S.C. section 360bbb-3(b)(1), unless the authorization is terminated or revoked.  Performed at [facility], [place] [address], [place], [place] [address]       Radiology Studies: VAS [place] LOWER EXTREMITY VENOUS (DVT)  Result Date: 11/08/2020  Lower Venous DVT Study Indications: Elevated Ddimer.  Comparison Study: No prior studies. Performing Technologist: Oliver Hum RVT  Examination Guidelines: A complete evaluation includes B-mode imaging, spectral Doppler, color Doppler, and power Doppler as needed of all accessible portions of each vessel. Bilateral testing is considered an integral part of a complete examination. Limited examinations for reoccurring indications may be performed as noted. The reflux portion of the exam is performed with the patient in reverse Trendelenburg.  +---------+---------------+---------+-----------+----------+--------------+ RIGHT    CompressibilityPhasicitySpontaneityPropertiesThrombus Aging  +---------+---------------+---------+-----------+----------+--------------+ CFV      Full           Yes      Yes                                 +---------+---------------+---------+-----------+----------+--------------+ SFJ      Full                                                        +---------+---------------+---------+-----------+----------+--------------+ FV Prox  Full                                                        +---------+---------------+---------+-----------+----------+--------------+ FV Mid   Full                                                        +---------+---------------+---------+-----------+----------+--------------+ FV DistalFull                                                        +---------+---------------+---------+-----------+----------+--------------+ PFV      Full                                                        +---------+---------------+---------+-----------+----------+--------------+  POP      Full           Yes      Yes                                 +---------+---------------+---------+-----------+----------+--------------+ PTV      Full                                                        +---------+---------------+---------+-----------+----------+--------------+ PERO     Full                                                        +---------+---------------+---------+-----------+----------+--------------+   +----+---------------+---------+-----------+----------+--------------+ LEFTCompressibilityPhasicitySpontaneityPropertiesThrombus Aging +----+---------------+---------+-----------+----------+--------------+ CFV Full           Yes      Yes                                 +----+---------------+---------+-----------+----------+--------------+     Summary: RIGHT: - There is no evidence of deep vein thrombosis in the lower extremity.  - No cystic structure found in the popliteal fossa.   LEFT: - No evidence of common femoral vein obstruction.  *See table(s) above for measurements and observations. Electronically signed by Deitra Mayo MD on 11/08/2020 at 5:48:21 PM.    Final       Scheduled Meds: . amLODipine  10 mg Oral Daily  . budesonide  6 mg Oral Daily  . buPROPion  200 mg Oral Daily  . Chlorhexidine Gluconate Cloth  6 each Topical Daily  . cholecalciferol  1,000 Units Oral Daily  . cycloSPORINE  1 drop Both Eyes BID  . DULoxetine  90 mg Oral Daily  . enoxaparin (LOVENOX) injection  40 mg Subcutaneous Q24H  . estradiol  2 mg Oral Daily  . famotidine  20 mg Oral QHS  . hydrALAZINE  50 mg Oral TID  . insulin aspart  0-9 Units Subcutaneous Q8H  . methadone  5 mg Oral 5 X Daily  . metoprolol tartrate  100 mg Oral BID  . multivitamin with minerals  1 tablet Oral Daily  . pantoprazole  40 mg Oral Daily  . polyvinyl alcohol  1 drop Both Eyes QID  . potassium chloride SA  40 mEq Oral Daily  . sodium chloride flush  10-40 mL Intracatheter Q12H  . sucralfate  1 g Oral TID WC & HS  . vitamin B-12  1,000 mcg Oral Daily   Continuous Infusions: . levofloxacin (LEVAQUIN) IV Stopped (11/08/20 1900)  . potassium PHOSPHATE IVPB (in mmol) 15 mmol (11/09/20 0833)  . TPN CYCLIC-ADULT (ION)       LOS: 2 days      Time spent: 35 minutes   Dessa Phi, DO [facility] 11/09/2020, 2:14 PM   Available via Epic secure chat 7am-7pm After these hours, please refer to coverage provider listed on [email or web address]

## 2020-11-09 NOTE — TOC Initial Note (Signed)
Transition of Care [facility]) - Initial/Assessment Note    Patient Details  Name: Caitlin Peters MRN: [id number] Date of Birth: [birth year]  Transition of Care [facility]) CM/SW Contact:    Peirce Deveney, Marjie Skiff, RN Phone Number: 11/09/2020, 10:51 AM  Clinical Narrative:                 Pt is from home and has chronic home TPN. Pt has [facility] for home health RN services and [employer] for home IV infusion. Liaison's from both companies notified of hospital admission and need for continued services at dc. TOC will continue to follow along.  Expected Discharge Plan: [place] Barriers to Discharge: Continued Medical Work up Expected Discharge Plan and Services Expected Discharge Plan: [place]         Expected Discharge Date:  (unknown)                         HH Arranged: TPN,RN [place]: [place] Date [facility] Agency Contacted: 11/09/20 Time HH Agency Contacted: 64 Representative spoke with at [place]: [place] and Tommi Rumps  Prior Living Arrangements/Services     Patient language and need for interpreter reviewed:: Yes        Need for Family Participation in Patient Care: Yes (Comment) Care giver support system in place?: Yes (comment) Current home services: Home RN Criminal Activity/Legal Involvement Pertinent to Current Situation/Hospitalization: No - Comment as needed  Activities of Daily Living Home Assistive Devices/Equipment: Engineer, drilling (specify type) (front wheeled walker, 4 wheeled walker) ADL Screening (condition at time of admission) Patient's cognitive ability adequate to safely complete daily activities?: Yes Is the patient deaf or have difficulty hearing?: No Does the patient have difficulty seeing, even when wearing glasses/contacts?: No Does the patient have difficulty concentrating, remembering, or making decisions?: No Patient able to express need for assistance with ADLs?: Yes Does the patient have difficulty  dressing or bathing?: No Independently performs ADLs?: No Communication: Independent Dressing (OT): Independent Grooming: Independent Feeding: Independent Bathing: Independent Toileting: Independent with device (comment) In/Out Bed: Independent Walks in Home: Independent with device (comment) Does the patient have difficulty walking or climbing stairs?: Yes (does not do stairs much) Weakness of Legs: Both Weakness of Arms/Hands: None  Permission Sought/Granted                  Emotional Assessment Appearance:: Appears stated age            Admission diagnosis:  Renal insufficiency [N28.9] SIRS (systemic inflammatory response syndrome) ([name]) [R65.10] Normochromic normocytic anemia [D64.9] Fever, unspecified fever cause [R50.9] Elevated blood pressure reading with diagnosis of hypertension [I10] Patient Active Problem List   Diagnosis Date Noted  . History of central line-associated bloodstream infection (CLABSI) 10/21/2020  . Nausea & vomiting 10/21/2020  . Choledocholithiasis (sludge) s/p ERCP 10/2019 10/21/2020  . 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 Abdominal pain 09/07/2020  . Acute metabolic encephalopathy   . Hypomagnesemia   . Partial small bowel obstruction ([place])   . Bacteremia associated with intravascular line ([place]) 08/04/2020  . Enterobacter sepsis ([place]) 07/22/2020  . SIRS (systemic inflammatory response syndrome) ([place]) 07/19/2020  . Anxiety 06/03/2020  . Acute pancreatitis 04/13/2020  . Infection due to Stenotrophomonas maltophilia 03/10/2020  . Pancytopenia ([place]) 03/05/2020  . Central line infection   . Elevated liver enzymes 01/02/2020  . Cholangitis   . Anasarca 10/28/2019  . Acute kidney injury superimposed on chronic kidney disease (  [place]) 10/28/2019  . Falls 10/28/2019  . Malnutrition ([place])   . Vitamin B12 deficiency   . GERD (gastroesophageal reflux disease)   . Chronic pain syndrome   . Hypokalemia due to excessive gastrointestinal  loss of potassium 10/13/2019  . Fever   . Hypokalemia 10/12/2019  . Chronic diarrhea 10/12/2019  . Dysuria 10/12/2019  . Bilateral lower extremity edema 10/12/2019  . AKI (acute kidney injury) ([place]) 10/12/2019  . Fungemia 08/27/2019  . IDA (iron deficiency anemia) 11/03/2018  . Dilation of biliary tract 08/28/2018  . Severe diarrhea 03/07/2018  . LFTs abnormal 01/27/2018  . GI tract obstruction ([place]) 01/27/2018  . CKD (chronic kidney disease) stage 3, GFR 30-59 ml/min ([name]) 11/30/2017  . Gram-negative bacteremia 09/13/2017  . HTN (hypertension), benign 12/02/2016  . Intractable pain 12/02/2016  . Anemia 12/02/2016  . 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 Avascular necrosis ([place]) 06/14/2015  . Polyarthralgia 05/03/2015  . On total parenteral nutrition (TPN) 12/30/2014  . Osteoporosis 12/24/2014  . Low back pain 12/16/2014  . Vitamin D deficiency 12/16/2014  . 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 History of colonic diverticulitis 2014  . Depression 07/24/2012  . Gram-positive bacteremia 07/24/2012  . Small bowel obstruction due to adhesions ([place]) 07/24/2012  . Diarrhea 12/13/2011  . Neuralgia and neuritis 06/01/2011  . Myalgia and myositis 08/12/2003  . Crohn's disease of small & large intestine with [place]   PCP:  [name], [name], MD Pharmacy:   [facility], [place]. [place]. [place] [address] Phone: [phone] Fax: [phone]     Social Determinants of Health (SDOH) Interventions    Readmission Risk Interventions Readmission Risk Prevention Plan 11/09/2020 09/09/2020 08/05/2020  Transportation Screening Complete Complete Complete  PCP or Specialist Appt within 3-5 Days - - -  Not Complete comments - - -  HRI or [place] for [place] consult not completed comments - - -  Palliative Care Screening - - -  Medication Review  Press photographer) Complete Complete Complete  PCP or Specialist appointment within 3-5 days of discharge Complete Complete Complete  HRI or Home Care Consult Complete Complete Complete  SW Recovery Care/Counseling Consult - Complete Complete  Palliative Care Screening Not Applicable Not Applicable Not [place] Not Applicable Not Applicable Not Applicable

## 2020-11-09 NOTE — Progress Notes (Signed)
PHARMACY - TOTAL PARENTERAL NUTRITION CONSULT NOTE   Indication: Chronic TPN, hx Crohn's disease with short gut syndrome  Patient Measurements: Height: 5' 8"  (172.7 cm) Weight: 61.2 kg (134 lb 14.7 oz) IBW/kg (Calculated) : 63.9 TPN AdjBW (KG): 61.2 Body mass index is 20.51 kg/m.  Assessment: Re-admitted on 12/13 with fever and chills.  History of recurrent PICC line infections.  Pharmacy is consulted to continue dosing chronic TPN while admitted.  Glucose / Insulin: CBGs 105-117 while TPN infusing.  Previously stable on chronic TPN without use of scheduled or SSI.  Goal CBGs 100-150. Electrolytes: K 3.5, Phos 2.3 after KPhos 45mol yesterday + Kdur 430m PO daily, Mag wnl, Cl low/CO2 elevated, CorrCa 10.04 Renal: SCr 0.98 improved to baseline,  BUN 25. Urine x 1. LFTs / TGs: elevated/improving, AST/ALT 28/118, AlkPhos 194, TG 31 (12/14) Prealbumin / albumin: Prealbumin 21.3 (12/14), Albumin 2.7 Intake / Output; MIVF: LR d/c'ed.  I/O question if recorded accurately. GI Imaging: n/a Surgeries / Procedures: n/a  Central access: Femoral CVC - double lumen (08/05/20) TPN start date: chronic, PTA  Nutritional Goals per RD recommendations 12/14: 1850-2050 kcal, 85-100g protein per day Inpatient cyclic TPN goal volume is 1800 ml/day and provides:  85 g protein, Kcal 1350   Home TPN formula: 1800 mL cycled over 12 hours to provide 85g protein and 1521 kcal with lipids 3x per week; without lipids 4x per week provides 85g protein and and 1121 kcal.  Average daily protein 85g and 1292 kcal.  Includes: KCl 75 mEq; Kphos 30 mmol/day, NaCl 90 mEq; NaAc 45 mEq, Mag sulfate 16 mEq  Current Nutrition:  TPN Cardiac Diet  Plan:  Now: KPhos 15 mmol IV x 1 (KCl 40 mEq PO daily ordered by MD from home meds)  At 18:00 Continue cycled TPN 1800 mL/day over 12 hours starting at 1800 Electrolytes in TPN:   130 mEq/L of Na  Increase 5540mL of K  5 mEq/L of Ca   7.5 mEq/L of Mg  15 mmol/L  of Phos Cl:Ac ratio: change to 1:1 Will not add standard MVI and trace elements to TPN, already receiving PO Initiate Sensitive q8h SSI and adjust as needed  Monitor TPN labs on Mon/Thurs..  Peggyann JubaharmD, BCPS [facility] main pharmacy [id number]/15/2021 7:12 AM

## 2020-11-10 ENCOUNTER — Emergency Department (HOSPITAL_COMMUNITY)
Admission: EM | Admit: 2020-11-10 | Discharge: 2020-11-11 | Disposition: A | Discharge status: Home / Self Care | Payer: Medicare Other | Attending: Emergency Medicine | Admitting: Emergency Medicine

## 2020-11-10 ENCOUNTER — Encounter (HOSPITAL_COMMUNITY): Payer: Self-pay

## 2020-11-10 ENCOUNTER — Telehealth: Payer: Self-pay | Admitting: Internal Medicine

## 2020-11-10 DIAGNOSIS — N289 Disorder of kidney and ureter, unspecified: Secondary | ICD-10-CM | POA: Diagnosis not present

## 2020-11-10 DIAGNOSIS — R109 Unspecified abdominal pain: Secondary | ICD-10-CM | POA: Diagnosis not present

## 2020-11-10 DIAGNOSIS — K219 Gastro-esophageal reflux disease without esophagitis: Secondary | ICD-10-CM | POA: Insufficient documentation

## 2020-11-10 DIAGNOSIS — R1084 Generalized abdominal pain: Secondary | ICD-10-CM

## 2020-11-10 DIAGNOSIS — Z87891 Personal history of nicotine dependence: Secondary | ICD-10-CM | POA: Insufficient documentation

## 2020-11-10 DIAGNOSIS — R110 Nausea: Secondary | ICD-10-CM | POA: Insufficient documentation

## 2020-11-10 DIAGNOSIS — T80211A Bloodstream infection due to central venous catheter, initial encounter: Secondary | ICD-10-CM | POA: Diagnosis not present

## 2020-11-10 DIAGNOSIS — I129 Hypertensive chronic kidney disease with stage 1 through stage 4 chronic kidney disease, or unspecified chronic kidney disease: Secondary | ICD-10-CM | POA: Insufficient documentation

## 2020-11-10 DIAGNOSIS — Z79899 Other long term (current) drug therapy: Secondary | ICD-10-CM | POA: Insufficient documentation

## 2020-11-10 DIAGNOSIS — N183 Chronic kidney disease, stage 3 unspecified: Secondary | ICD-10-CM | POA: Insufficient documentation

## 2020-11-10 LAB — PHOSPHORUS: Phosphorus: 3.6 mg/dL (ref 2.5–4.6)

## 2020-11-10 LAB — COMPREHENSIVE METABOLIC PANEL
ALT: 83 U/L — ABNORMAL HIGH (ref 0–44)
AST: 19 U/L (ref 15–41)
Albumin: 2.7 g/dL — ABNORMAL LOW (ref 3.5–5.0)
Alkaline Phosphatase: 171 U/L — ABNORMAL HIGH (ref 38–126)
Anion gap: 9 (ref 5–15)
BUN: 28 mg/dL — ABNORMAL HIGH (ref 6–20)
CO2: 29 mmol/L (ref 22–32)
Calcium: 9.2 mg/dL (ref 8.9–10.3)
Chloride: 102 mmol/L (ref 98–111)
Creatinine, Ser: 1.05 mg/dL — ABNORMAL HIGH (ref 0.44–1.00)
GFR, Estimated: >60 mL/min (ref >60)
Glucose, Bld: 118 mg/dL — ABNORMAL HIGH (ref 70–99)
Potassium: 4.4 mmol/L (ref 3.5–5.1)
Sodium: 140 mmol/L (ref 135–145)
Total Bilirubin: 0.5 mg/dL (ref 0.3–1.2)
Total Protein: 6.2 g/dL — ABNORMAL LOW (ref 6.5–8.1)

## 2020-11-10 LAB — GLUCOSE, CAPILLARY
Glucose-Capillary: 101 mg/dL — ABNORMAL HIGH (ref 70–99)
Glucose-Capillary: 135 mg/dL — ABNORMAL HIGH (ref 70–99)

## 2020-11-10 LAB — MAGNESIUM: Magnesium: 2.3 mg/dL (ref 1.7–2.4)

## 2020-11-10 MED ORDER — LOPERAMIDE HCL 2 MG PO CAPS: 2.0000 mg | ORAL_CAPSULE | ORAL | Status: DC | PRN

## 2020-11-10 MED ORDER — MEROPENEM IV (FOR PTA / DISCHARGE USE ONLY): 1.0000 g | Freq: Three times a day (TID) | INTRAVENOUS | 0 refills | Status: AC

## 2020-11-10 MED ORDER — TRAVASOL 10 % IV SOLN
INTRAVENOUS | Status: DC
  Filled 2020-11-10: qty 846

## 2020-11-10 MED ORDER — SODIUM CHLORIDE 0.9 % IV SOLN
1.0000 g | Freq: Three times a day (TID) | INTRAVENOUS | Status: DC
  Administered 2020-11-10: 1 g via INTRAVENOUS
  Filled 2020-11-10: qty 1

## 2020-11-10 MED ORDER — DIPHENOXYLATE-ATROPINE 2.5-0.025 MG/5ML PO LIQD: 5.0000 mL | Freq: Four times a day (QID) | ORAL | Status: DC | PRN

## 2020-11-10 NOTE — Telephone Encounter (Signed)
Transition Care Management Unsuccessful Follow-up Telephone Call  Date of discharge and from where:  11/10/2020 from [facility]   Attempts:  1st Attempt  Reason for unsuccessful TCM follow-up call:  Left voice message

## 2020-11-10 NOTE — Progress Notes (Signed)
PHARMACY - TOTAL PARENTERAL NUTRITION CONSULT NOTE   Indication: Chronic TPN, hx Crohn's disease with short gut syndrome  Patient Measurements: Height: 5' 8"  (172.7 cm) Weight: 61.2 kg (134 lb 14.7 oz) IBW/kg (Calculated) : 63.9 TPN AdjBW (KG): 61.2 Body mass index is 20.51 kg/m.  Assessment: Re-admitted on 12/13 with fever and chills.  History of recurrent PICC line infections.  Pharmacy is consulted to continue dosing chronic TPN while admitted.  Glucose / Insulin: CBGs 100-135 while TPN infusing.  Previously stable on chronic TPN without use of scheduled or SSI.  Goal CBGs 100-150. Electrolytes: K increased 4.4, Phos increased 3.6 after KPhos 23mol 12/14 & 12/15 + Kdur 449m PO daily, Mag wnl, Cl low/CO2 now wnl after changing Cl:Ac to 1:1, CorrCa 10.24 which is trending up Renal: SCr 1.05 which is trending up,  BUN 28. Urine x 6. LFTs / TGs: elevated/improving, AST/ALT 19/83, AlkPhos 171, TG 31 (12/14) Prealbumin / albumin: Prealbumin 21.3 (12/14), Albumin 2.7 Intake / Output; MIVF: LR d/c'ed.  I/O question if recorded accurately. GI Imaging: n/a Surgeries / Procedures: n/a  Central access: Femoral CVC - double lumen (08/05/20) TPN start date: chronic, PTA  Nutritional Goals per RD recommendations 12/14: 1850-2050 kcal, 85-100g protein per day Inpatient cyclic TPN goal volume is 1800 ml/day and provides:  85 g protein, Kcal 1350   Home TPN formula: 1800 mL cycled over 12 hours to provide 85g protein and 1521 kcal with lipids 3x per week; without lipids 4x per week provides 85g protein and and 1121 kcal.  Average daily protein 85g and 1292 kcal.  Includes: KCl 75 mEq; Kphos 30 mmol/day, NaCl 90 mEq; NaAc 45 mEq, Mag sulfate 16 mEq  Current Nutrition:  TPN Cardiac Diet  Plan:   At 18:00 Continue cycled TPN 1800 mL/day over 12 hours starting at 1800 Electrolytes in TPN:   130 mEq/L of Na  Decrease 5049mL of K  Decrease 2.5 mEq/L of Ca   7.5 mEq/L of Mg  15 mmol/L  of Phos  Continue Cl:Ac ratio 1:1 Will not add standard MVI and trace elements to TPN, already receiving PO Sensitive q8h SSI q8h Monitor TPN labs on Mon/Thurs..  Peggyann JubaharmD, BCPS [facility] main pharmacy [id number]/16/2021 7:01 AM

## 2020-11-10 NOTE — Care Management Important Message (Signed)
Important Message  Patient Details IM Letter given to the Patient. Name: Caitlin Peters MRN: [id number] Date of Birth: 1960/12/21   Medicare Important Message Given:  Yes     [name] 11/10/2020, 10:02 AM

## 2020-11-10 NOTE — Progress Notes (Signed)
PHARMACY CONSULT NOTE FOR:  OUTPATIENT  PARENTERAL ANTIBIOTIC THERAPY (OPAT)  Indication: Bacteremia  Regimen: Merrem 1g IV q8h End date: 11/21/2020  IV antibiotic discharge orders are pended. To discharging provider:  please sign these orders via discharge navigator,  Select New Orders & click on the button choice - Manage This Unsigned Work.     Thank you for allowing pharmacy to be a part of this patient's care.  Phillis Haggis 11/10/2020, 10:40 AM

## 2020-11-10 NOTE — Discharge Summary (Signed)
Physician Discharge Summary  Caitlin Peters GYK:[id number] DOB: 1961-03-26 DOA: 11/06/2020  PCP: [name], [name], MD  Admit date: 11/06/2020 Discharge date: 11/10/2020  Admitted From: Home Disposition:  Home  Recommendations for Outpatient Follow-up:  1. Follow up with Dr. Tommy Medal on 12/20   Discharge Condition: Stable CODE STATUS: Full code Diet recommendation:  Diet Orders (From admission, onward)    Start     Ordered   11/10/20 0000  Diet - low sodium heart healthy        11/10/20 1101   11/07/20 0249  Diet Heart Room service appropriate? Yes; Fluid consistency: Thin  Diet effective now       Question Answer Comment  Room service appropriate? Yes   Fluid consistency: Thin      11/07/20 0249         Brief/Interim Summary: Caitlin Peters is a 59 year old female with Crohn's disease, multiple surgeries in the past with short gut syndrome, chronic PICC line with TPN use with history of recurrent PICC line infection who comes to the hospital with complaints of weakness and fever.  She was started on IV antibiotics, blood cultures obtained.  Infectious disease consulted.  Blood culture resulted with Acinetobacter.  Her antibiotic was switched to meropenem with close outpatient follow-up pending sensitivities.  Discharge Diagnoses:  Principal Problem:   Bacteremia associated with intravascular line ([place]) Active Problems:   Short gut syndrome   HTN (hypertension), benign   Chronic pain syndrome   On total parenteral nutrition (TPN)    Acinetobacter bacteremia -Sepsis ruled out at time of admission -Has history of Stenotrophomonas and Enterobacter, recurrent PICC line infections  -Blood culture showing acinetobacter, await identification and susceptibilities  -Infectious disease following -Levaquin --> Merrem  -Follow up closely with Dr. Tommy Medal   Crohn's disease, short gut syndrome with multiple surgeries in the past, chronic TPN -Continue budesonide,  Creon, TPN per pharmacy  Hypertension -Continue Norvasc, Lopressor  Chronic pain -Continue methadone, Dilaudid  Depression/anxiety -Continue Wellbutrin, Cymbalta   Discharge Instructions  Discharge Instructions    [facility] pharmacist to adjust dose for Vancomycin, Aminoglycosides and other anti-infective therapies as requested by physician.   Complete by: As directed    [facility] to provide Cath Flo 52m   Complete by: As directed    Administer for PICC line occlusion and as ordered by physician for other access device issues.   Anaphylaxis Kit: Provided to treat any anaphylactic reaction to the medication being provided to the patient if First Dose or when requested by physician   Complete by: As directed    Epinephrine 142mml vial / amp: Administer 0.54m69m0.54ml76mubcutaneously once for moderate to severe anaphylaxis, nurse to call physician and pharmacy when reaction occurs and call 911 if needed for immediate care   Diphenhydramine 50mg84mIV vial: Administer 25-50mg 19mM PRN for first dose reaction, rash, itching, mild reaction, nurse to call physician and pharmacy when reaction occurs   Sodium Chloride 0.9% NS 500ml I74mdminister if needed for hypovolemic blood pressure drop or as ordered by physician after call to physician with anaphylactic reaction   Call MD for:  difficulty breathing, headache or visual disturbances   Complete by: As directed    Call MD for:  extreme fatigue   Complete by: As directed    Call MD for:  hives   Complete by: As directed    Call MD for:  persistant dizziness or light-headedness   Complete  by: As directed    Call MD for:  persistant nausea and vomiting   Complete by: As directed    Call MD for:  severe uncontrolled pain   Complete by: As directed    Call MD for:  temperature >100.4   Complete by: As directed    Change dressing on IV access line weekly and PRN   Complete by: As directed    Diet - low sodium  heart healthy   Complete by: As directed    Discharge instructions   Complete by: As directed    You were cared for by a hospitalist during your hospital stay. If you have any questions about your discharge medications or the care you received while you were in the hospital after you are discharged, you can call the unit and ask to speak with the hospitalist on call if the hospitalist that took care of you is not available. Once you are discharged, your primary care physician will handle any further medical issues. Please note that NO REFILLS for any discharge medications will be authorized once you are discharged, as it is imperative that you return to your primary care physician (or establish a relationship with a primary care physician if you do not have one) for your aftercare needs so that they can reassess your need for medications and monitor your lab values.   Flush IV access with Sodium Chloride 0.9% and Heparin 10 units/ml or 100 units/ml   Complete by: As directed    Home infusion instructions - [facility]   Complete by: As directed    Instructions: Flush IV access with Sodium Chloride 0.9% and Heparin 10units/ml or 100units/ml   Change dressing on IV access line: Weekly and PRN   Instructions Cath Flo 67m: Administer for PICC Line occlusion and as ordered by physician for other access device   [facility] pharmacist to adjust dose for: Vancomycin, Aminoglycosides and other anti-infective therapies as requested by physician   Increase activity slowly   Complete by: As directed    Method of administration may be changed at the discretion of home infusion pharmacist based upon assessment of the patient and/or caregiver's ability to self-administer the medication ordered   Complete by: As directed      Allergies as of 11/10/2020      Reactions   Cefepime Other (See Comments)   Neurotoxicity occurring in setting of AKI. Ceftriaxone tolerated during same admit    Gabapentin Other (See Comments)   unknown   Hyoscyamine Hives, Swelling   Legs swelling Disorientation   Lyrica [pregabalin] Other (See Comments)   unknown   Meperidine Hives   Other reaction(s): GI Upset Due to Chrones   Topamax [topiramate] Other (See Comments)   unknown   Zosyn [piperacillin Sod-tazobactam So]    Patient reports it makes her vomit, her neck stiff, and her "heart feel funny"   Fentanyl Rash   Pt is allergic to fentanyl patch related to the glue (gives her a rash) Pt states she is NOT allergic to fentanyl IV medicine   Morphine And Related Rash      Medication List    STOP taking these medications   potassium chloride SA 20 MEQ tablet Commonly known as: KLOR-CON   senna-docusate 8.6-50 MG tablet Commonly known as: Senokot-S     TAKE these medications   acetaminophen 325 MG tablet Commonly known as: TYLENOL Take 650 mg by mouth every 6 (six) hours as needed for mild pain.  amLODipine 10 MG tablet Commonly known as: NORVASC Take 1 tablet (10 mg total) by mouth daily.   budesonide 3 MG 24 hr capsule Commonly known as: ENTOCORT EC TAKE 3 CAPSULES BY MOUTH ONCE DAILY What changed: how much to take   buPROPion 100 MG 12 hr tablet Commonly known as: WELLBUTRIN SR Take 2 tablets by mouth once daily   cholecalciferol 25 MCG (1000 UNIT) tablet Commonly known as: VITAMIN D3 Take 1,000 Units by mouth daily.   cycloSPORINE 0.05 % ophthalmic emulsion Commonly known as: RESTASIS Place 1 drop into both eyes 2 (two) times daily.   Dexilant 60 MG capsule Generic drug: dexlansoprazole Take 1 capsule (60 mg total) by mouth daily.   Dextran 70-Hypromellose 0.1-0.3 % Soln Place 1 drop into both eyes 4 (four) times daily.   diphenoxylate-atropine 2.5-0.025 MG tablet Commonly known as: LOMOTIL Take 1 tablet by mouth 4 (four) times daily as needed for diarrhea or loose stools.   DULoxetine 30 MG capsule Commonly known as: CYMBALTA Take 3 capsules (90 mg  total) by mouth daily.   estradiol 2 MG tablet Commonly known as: ESTRACE Take 1 tablet (2 mg total) by mouth daily.   hydrALAZINE 50 MG tablet Commonly known as: APRESOLINE Take 1 tablet (50 mg total) by mouth 3 (three) times daily.   HYDROmorphone 4 MG tablet Commonly known as: DILAUDID Take 4 mg by mouth 4 (four) times daily as needed for moderate pain.   lipase/protease/amylase 36000 UNITS Cpep capsule Commonly known as: Creon Take 1 capsule (36,000 Units total) by mouth 3 (three) times daily as needed (with meals for digestion).   loperamide 2 MG capsule Commonly known as: IMODIUM TAKE 1 CAPSULE BY MOUTH AS NEEDED FOR DIARRHEA OR  LOOSE  STOOLS What changed:   how much to take  how to take this  when to take this  reasons to take this  additional instructions   meropenem  IVPB Commonly known as: MERREM Inject 1 g into the vein every 8 (eight) hours for 11 days. Indication:  Bacteremia  First Dose: No Last Day of Therapy:  11/21/2020 Labs - Once weekly:  CBC/D and BMP, Labs - Every other week:  ESR and CRP Method of administration: Mini-Bag Plus / Gravity Method of administration may be changed at the discretion of home infusion pharmacist based upon assessment of the patient and/or caregiver's ability to self-administer the medication ordered.   methadone 5 MG tablet Commonly known as: DOLOPHINE Take 1 tablet (5 mg total) by mouth 5 (five) times daily.   metoprolol tartrate 100 MG tablet Commonly known as: LOPRESSOR Take 1 tablet (100 mg total) by mouth 2 (two) times daily.   MULTIVITAMIN ADULT PO Take 1 tablet by mouth daily.   Narcan 4 MG/0.1ML Liqd nasal spray kit Generic drug: naloxone Place 1 spray into the nose once.   PRESCRIPTION MEDICATION Inject 1 each into the vein daily. Home TPN . [facility] . 1 bag for 12 hours. [phone]   PROBIOTIC-10 PO Take 1 capsule by mouth daily.   promethazine 25 MG  tablet Commonly known as: PHENERGAN TAKE 1 TABLET BY MOUTH EVERY 6 HOURS AS NEEDED FOR NAUSEA AND VOMITING What changed:   how much to take  how to take this  when to take this  reasons to take this  additional instructions   sodium chloride 0.9 % infusion Inject into the vein.   sucralfate 1 GM/10ML suspension Commonly known as: Carafate Take  10 mLs (1 g total) by mouth 4 (four) times daily -  with meals and at bedtime.   TRALEMENT IV Inject 1 mL into the vein. Used in hydration bag 4 times weekly   traZODone 100 MG tablet Commonly known as: DESYREL Take 2 tablets (200 mg total) by mouth at bedtime as needed for sleep.   vitamin B-12 100 MCG tablet Commonly known as: CYANOCOBALAMIN Take 1,000 mcg by mouth daily.            Discharge Care Instructions  (From admission, onward)         Start     Ordered   11/10/20 0000  Change dressing on IV access line weekly and PRN  (Home infusion instructions - [facility] )        11/10/20 1101          Follow-up Information    [name], [name], MD. Schedule an appointment as soon as possible for a visit in 1 week(s).   Specialty: Internal Medicine Contact information: [place] [address] [place], Lavell Islam, MD Follow up.   Specialty: Infectious Diseases Contact information: [address] [address] [phone]              Allergies  Allergen Reactions  . Cefepime Other (See Comments)    Neurotoxicity occurring in setting of AKI. Ceftriaxone tolerated during same admit  . Gabapentin Other (See Comments)    unknown  . Hyoscyamine Hives and Swelling    Legs swelling   Disorientation  . Lyrica [Pregabalin] Other (See Comments)    unknown  . Meperidine Hives    Other reaction(s): GI Upset Due to Chrones   . Topamax [Topiramate] Other (See Comments)    unknown  . Zosyn [Piperacillin Sod-Tazobactam So]      Patient reports it makes her vomit, her neck stiff, and her "heart feel funny"  . Fentanyl Rash    Pt is allergic to fentanyl patch related to the glue (gives her a rash) Pt states she is NOT allergic to fentanyl IV medicine  . Morphine And Related Rash    Consultations:  ID    Procedures/Studies: CT ABDOMEN PELVIS WO CONTRAST  Result Date: 10/21/2020 CLINICAL DATA:  Abdominal distension history of bowel obstruction and surgeries EXAM: CT ABDOMEN AND PELVIS WITHOUT CONTRAST TECHNIQUE: Multidetector CT imaging of the abdomen and pelvis was performed following the standard protocol without IV contrast. COMPARISON:  09/07/2020 FINDINGS: Lower chest: No acute abnormality. Hepatobiliary: Stable appearance of the liver. Pneumobilia is present. Cholecystectomy. Pancreas: Stable mild dilatation of the main pancreatic duct. Spleen: Unremarkable. Adrenals/Urinary Tract: Adrenals, kidneys, and bladder are unremarkable. Stomach/Bowel: Stomach is within normal limits. Evidence of prior bowel resections involving large and small bowel. Mild dilatation of several small bowel loops in the upper and lower abdomen without apparent transition point. Degree of dilatation is less than the October study. Vascular/Lymphatic: Mild aortic atherosclerosis. Right femoral central venous catheter extends to the intrahepatic IVC. No enlarged lymph nodes identified. Reproductive: Status post hysterectomy. No adnexal masses. Other: No ascites.  No new abnormality of the abdominal wall. Musculoskeletal: No acute osseous abnormality. IMPRESSION: Mild dilatation of several small bowel loops in the upper and lower abdomen without apparent transition point. There is suboptimal evaluation in the absence of contrast. May reflect low-grade obstruction. Degree of dilatation is less than the 09/07/2020 study. Electronically Signed   By: Malachi Carl  Patel M.D.   On: 10/21/2020 09:16   DG Chest Port 1 View  Result Date: 11/07/2020 CLINICAL  DATA:  Shortness of breath and fever EXAM: PORTABLE CHEST 1 VIEW COMPARISON:  August 30, 2020 FINDINGS: The cardiomediastinal silhouette is unchanged in contour. No pleural effusion. No pneumothorax. No acute pleuroparenchymal abnormality. Gaseous distention of bowel beneath diaphragm. Surgical sutures project over the LEFT upper quadrant. Lower extremity central venous catheter tip projects over the expected region of the intrahepatic IVC. Clips project over the RIGHT thorax. Dextrocurvature of the thoracic spine. IMPRESSION: 1. No acute cardiopulmonary abnormality. Electronically Signed   By: Valentino Saxon MD   On: 11/07/2020 07:49   DG Abd Portable 1V-Small Bowel Obstruction Protocol-initial, 8 hr delay  Result Date: 10/22/2020 CLINICAL DATA:  Small bowel obstruction.  8 hour delay. EXAM: PORTABLE ABDOMEN - 1 VIEW COMPARISON:  10/21/2020 FINDINGS: Contrast material is demonstrated throughout the colon. This indicates no evidence of complete small bowel obstruction. The colon is diffusely distended, possibly due to adynamic ileus. IMPRESSION: Contrast material throughout the colon suggesting no evidence of complete small bowel obstruction. Electronically Signed   By: Lucienne Capers M.D.   On: 10/22/2020 04:20   DG Abd Portable 1V-Small Bowel Protocol-Position Verification  Result Date: 10/21/2020 CLINICAL DATA:  59 year old female status post NG placement. EXAM: PORTABLE ABDOMEN - 1 VIEW COMPARISON:  Abdominal radiograph dated 09/10/2020 and CT dated 10/21/2020 FINDINGS: Enteric tube with side-port in the region of the GE junction and tip in the proximal stomach. Recommend further advancing of the tube by additional 7 cm. Air is noted in the colon. Bowel suture noted in the left upper abdomen. An inferiorly accessed venous line and right upper quadrant cholecystectomy clips. IMPRESSION: Enteric tube with tip in the proximal stomach. Recommend further advancing of the tube by additional 7 cm.  Electronically Signed   By: Anner Crete M.D.   On: 10/21/2020 18:41   VAS [place] LOWER EXTREMITY VENOUS (DVT)  Result Date: 11/08/2020  Lower Venous DVT Study Indications: Elevated Ddimer.  Comparison Study: No prior studies. Performing Technologist: Oliver Hum RVT  Examination Guidelines: A complete evaluation includes B-mode imaging, spectral Doppler, color Doppler, and power Doppler as needed of all accessible portions of each vessel. Bilateral testing is considered an integral part of a complete examination. Limited examinations for reoccurring indications may be performed as noted. The reflux portion of the exam is performed with the patient in reverse Trendelenburg.  +---------+---------------+---------+-----------+----------+--------------+ RIGHT    CompressibilityPhasicitySpontaneityPropertiesThrombus Aging +---------+---------------+---------+-----------+----------+--------------+ CFV      Full           Yes      Yes                                 +---------+---------------+---------+-----------+----------+--------------+ SFJ      Full                                                        +---------+---------------+---------+-----------+----------+--------------+ FV Prox  Full                                                        +---------+---------------+---------+-----------+----------+--------------+  FV Mid   Full                                                        +---------+---------------+---------+-----------+----------+--------------+ FV DistalFull                                                        +---------+---------------+---------+-----------+----------+--------------+ PFV      Full                                                        +---------+---------------+---------+-----------+----------+--------------+ POP      Full           Yes      Yes                                  +---------+---------------+---------+-----------+----------+--------------+ PTV      Full                                                        +---------+---------------+---------+-----------+----------+--------------+ PERO     Full                                                        +---------+---------------+---------+-----------+----------+--------------+   +----+---------------+---------+-----------+----------+--------------+ LEFTCompressibilityPhasicitySpontaneityPropertiesThrombus Aging +----+---------------+---------+-----------+----------+--------------+ CFV Full           Yes      Yes                                 +----+---------------+---------+-----------+----------+--------------+     Summary: RIGHT: - There is no evidence of deep vein thrombosis in the lower extremity.  - No cystic structure found in the popliteal fossa.  LEFT: - No evidence of common femoral vein obstruction.  *See table(s) above for measurements and observations. Electronically signed by Deitra Mayo MD on 11/08/2020 at 5:48:21 PM.    Final       Discharge Exam: Vitals:   11/09/20 2103 11/10/20 0456  BP:  (!) 164/73  Pulse: 75 (!) 109  Resp:  18  Temp:  98.3 F (36.8 C)  SpO2:  99%    General: Pt is alert, awake, not in acute distress Cardiovascular: RRR, S1/S2 +, no edema Respiratory: CTA bilaterally, no wheezing, no rhonchi, no respiratory distress, no conversational dyspnea  Abdominal: Soft, NT, ND, bowel sounds + Extremities: no edema, no cyanosis Psych: Normal mood and affect, stable judgement and insight     The results of significant diagnostics from this hospitalization (including imaging, microbiology, ancillary and laboratory) are listed below for  reference.     Microbiology: Recent Results (from the past 240 hour(s))  Urine culture     Status: None   Collection Time: 11/06/20 11:29 PM   Specimen: In/Out Cath Urine  Result Value Ref Range Status    Specimen Description   Final    IN/OUT CATH URINE Performed at [facility], [place] [address], [place], [place] [address]    Special Requests   Final    NONE Performed at [facility], [place] [address] [place], [place] [address]    Culture   Final    NO GROWTH Performed at [facility], [place] [address], [place], [place] [address]    Report Status 11/08/2020 FINAL  Final  Blood Culture (routine x 2)     Status: Abnormal (Preliminary result)   Collection Time: 11/06/20 11:55 PM   Specimen: BLOOD  Result Value Ref Range Status   Specimen Description   Final    BLOOD RIGHT ANTECUBITAL Performed at [facility], [place] [address] [place], [place] [address]    Special Requests   Final    BOTTLES DRAWN AEROBIC AND ANAEROBIC Blood Culture adequate volume Performed at [place] [address], [place], [place] [address]    Culture  Setup Time   Final    GRAM NEGATIVE RODS AEROBIC BOTTLE ONLY CRITICAL RESULT CALLED TO, READ BACK BY AND VERIFIED WITH: Shelda Jakes PHARMD, AT 1244 11/08/20 BY D. VANHOOK    Culture (A)  Final    ACINETOBACTER URSINGII Sent to [place] for further susceptibility testing. Performed at [facility], [place] [address], [place], [place] [address]    Report Status PENDING  Incomplete  Resp Panel by RT-PCR (Flu A&B, Covid) Nasopharyngeal Swab     Status: None   Collection Time: 11/07/20  1:46 AM   Specimen: Nasopharyngeal Swab; Nasopharyngeal(NP) swabs in vial transport medium  Result Value Ref Range Status   SARS Coronavirus 2 by RT PCR NEGATIVE NEGATIVE Final    Comment: (NOTE) SARS-CoV-2 target nucleic acids are NOT DETECTED.  The SARS-CoV-2 RNA is generally detectable in upper respiratory specimens during the acute phase of infection. The lowest concentration of SARS-CoV-2 viral copies this assay can detect is 138 copies/mL. A negative result does not preclude SARS-Cov-2 infection and  should not be used as the sole basis for treatment or other patient management decisions. A negative result may occur with  improper specimen collection/handling, submission of specimen other than nasopharyngeal swab, presence of viral mutation(s) within the areas targeted by this assay, and inadequate number of viral copies(<138 copies/mL). A negative result must be combined with clinical observations, patient history, and epidemiological information. The expected result is Negative.  Fact Sheet for Patients:  EntrepreneurPulse.com.au  Fact Sheet for Healthcare Providers:  IncredibleEmployment.be  This test is no t yet approved or cleared by the [place] FDA and  has been authorized for detection and/or diagnosis of SARS-CoV-2 by FDA under an Emergency Use Authorization (EUA). This EUA will remain  in effect (meaning this test can be used) for the duration of the COVID-19 declaration under Section 564(b)(1) of the Act, 21 U.S.C.section 360bbb-3(b)(1), unless the authorization is terminated  or revoked sooner.       Influenza A by PCR NEGATIVE NEGATIVE Final   Influenza B by PCR NEGATIVE NEGATIVE Final    Comment: (NOTE) The Xpert Xpress SARS-CoV-2/FLU/RSV plus assay is intended as an aid in the diagnosis of influenza from  Nasopharyngeal swab specimens and should not be used as a sole basis for treatment. Nasal washings and aspirates are unacceptable for Xpert Xpress SARS-CoV-2/FLU/RSV testing.  Fact Sheet for Patients: EntrepreneurPulse.com.au  Fact Sheet for Healthcare Providers: IncredibleEmployment.be  This test is not yet approved or cleared by the [place] FDA and has been authorized for detection and/or diagnosis of SARS-CoV-2 by FDA under an Emergency Use Authorization (EUA). This EUA will remain in effect (meaning this test can be used) for the duration of the COVID-19 declaration  under Section 564(b)(1) of the Act, 21 U.S.C. section 360bbb-3(b)(1), unless the authorization is terminated or revoked.  Performed at [facility], [place] [address], [place], [place] [address]      Labs: BNP (last 3 results) Recent Labs    11/26/19 0209  BNP 381.7*   Basic Metabolic Panel: Recent Labs  Lab 11/06/20 2329 11/07/20 0350 11/08/20 0530 11/09/20 0422 11/10/20 0602  NA 137 135 137 139 140  K 3.6 3.3* 3.8 3.5 4.4  CL 109 107 103 97* 102  CO2 20* 19* 27 35* 29  GLUCOSE 101* 97 115* 121* 118*  BUN 22* 19 23* 25* 28*  CREATININE 1.29* 1.12* 1.05* 0.98 1.05*  CALCIUM 8.5* 8.4* 8.7* 9.0 9.2  MG  --  1.6* 1.9 2.1 2.3  PHOS  --  2.7 1.6* 2.3* 3.6   Liver Function Tests: Recent Labs  Lab 11/06/20 2329 11/07/20 0350 11/08/20 0530 11/09/20 0422 11/10/20 0602  AST 142* 109* 47* 28 19  ALT 287* 238* 154* 118* 83*  ALKPHOS 195* 170* 187* 194* 171*  BILITOT 0.6 0.7 0.8 0.7 0.5  PROT 6.6 5.9* 5.7* 6.4* 6.2*  ALBUMIN 3.1* 2.7* 2.5* 2.7* 2.7*   No results for input(s): LIPASE, AMYLASE in the last 168 hours. No results for input(s): AMMONIA in the last 168 hours. CBC: Recent Labs  Lab 11/06/20 2329 11/07/20 0350 11/08/20 0530 11/09/20 0422  WBC 7.2 7.1 8.2 5.2  NEUTROABS 6.0 5.7 6.9  --   HGB 8.2* 7.6* 7.4* 9.2*  HCT 25.0* 23.0* 22.8* 27.3*  MCV 93.6 92.7 90.5 89.8  PLT 125* 120* 104* 121*   Cardiac Enzymes: No results for input(s): CKTOTAL, CKMB, CKMBINDEX, TROPONINI in the last 168 hours. BNP: Invalid input(s): POCBNP CBG: Recent Labs  Lab 11/09/20 0009 11/09/20 0736 11/09/20 1650 11/10/20 0012 11/10/20 0738  GLUCAP 117* 107* 100* 135* 101*   D-Dimer Recent Labs    11/08/20 0939  DDIMER 1.88*   Hgb A1c No results for input(s): HGBA1C in the last 72 hours. Lipid Profile Recent Labs    11/08/20 0530  TRIG 31   Thyroid function studies No results for input(s): TSH, T4TOTAL, T3FREE, THYROIDAB in the last 72  hours.  Invalid input(s): FREET3 Anemia work up No results for input(s): VITAMINB12, FOLATE, FERRITIN, TIBC, IRON, RETICCTPCT in the last 72 hours. Urinalysis    Component Value Date/Time   COLORURINE STRAW (A) 11/06/2020 2329   APPEARANCEUR CLEAR 11/06/2020 2329   LABSPEC 1.010 11/06/2020 2329   PHURINE 5.0 11/06/2020 2329   GLUCOSEU NEGATIVE 11/06/2020 2329   HGBUR NEGATIVE 11/06/2020 2329   BILIRUBINUR NEGATIVE 11/06/2020 2329   KETONESUR NEGATIVE 11/06/2020 2329   PROTEINUR NEGATIVE 11/06/2020 2329   NITRITE NEGATIVE 11/06/2020 2329   LEUKOCYTESUR NEGATIVE 11/06/2020 2329   Sepsis Labs Invalid input(s): PROCALCITONIN,  WBC,  LACTICIDVEN Microbiology Recent Results (from the past 240 hour(s))  Urine culture     Status: None   Collection Time: 11/06/20 11:29  PM   Specimen: In/Out Cath Urine  Result Value Ref Range Status   Specimen Description   Final    IN/OUT CATH URINE Performed at [facility], [place] [address], [place], [place] [address]    Special Requests   Final    NONE Performed at [facility], [place] [address] [place], [place] [address]    Culture   Final    NO GROWTH Performed at [facility], [place] [address] [place], [place] [address]    Report Status 11/08/2020 FINAL  Final  Blood Culture (routine x 2)     Status: Abnormal (Preliminary result)   Collection Time: 11/06/20 11:55 PM   Specimen: BLOOD  Result Value Ref Range Status   Specimen Description   Final    BLOOD RIGHT ANTECUBITAL Performed at [facility], [place] [address] [place], [place] [address]    Special Requests   Final    BOTTLES DRAWN AEROBIC AND ANAEROBIC Blood Culture adequate volume Performed at [place] [address], [place], [place] [address]    Culture  Setup Time   Final    GRAM NEGATIVE RODS AEROBIC BOTTLE ONLY CRITICAL RESULT CALLED TO, READ BACK BY AND VERIFIED WITH: Shelda Jakes PHARMD, AT 1244 11/08/20  BY D. VANHOOK    Culture (A)  Final    ACINETOBACTER URSINGII Sent to [place] for further susceptibility testing. Performed at [facility], [place] [address] [place],  [address]    Report Status PENDING  Incomplete  Resp Panel by RT-PCR (Flu A&B, Covid) Nasopharyngeal Swab     Status: None   Collection Time: 11/07/20  1:46 AM   Specimen: Nasopharyngeal Swab; Nasopharyngeal(NP) swabs in vial transport medium  Result Value Ref Range Status   SARS Coronavirus 2 by RT PCR NEGATIVE NEGATIVE Final    Comment: (NOTE) SARS-CoV-2 target nucleic acids are NOT DETECTED.  The SARS-CoV-2 RNA is generally detectable in upper respiratory specimens during the acute phase of infection. The lowest concentration of SARS-CoV-2 viral copies this assay can detect is 138 copies/mL. A negative result does not preclude SARS-Cov-2 infection and should not be used as the sole basis for treatment or other patient management decisions. A negative result may occur with  improper specimen collection/handling, submission of specimen other than nasopharyngeal swab, presence of viral mutation(s) within the areas targeted by this assay, and inadequate number of viral copies(<138 copies/mL). A negative result must be combined with clinical observations, patient history, and epidemiological information. The expected result is Negative.  Fact Sheet for Patients:  EntrepreneurPulse.com.au  Fact Sheet for Healthcare Providers:  IncredibleEmployment.be  This test is no t yet approved or cleared by the [place] FDA and  has been authorized for detection and/or diagnosis of SARS-CoV-2 by FDA under an Emergency Use Authorization (EUA). This EUA will remain  in effect (meaning this test can be used) for the duration of the COVID-19 declaration under Section 564(b)(1) of the Act, 21 U.S.C.section 360bbb-3(b)(1), unless the authorization is terminated  or revoked sooner.        Influenza A by PCR NEGATIVE NEGATIVE Final   Influenza B by PCR NEGATIVE NEGATIVE Final    Comment: (NOTE) The Xpert Xpress SARS-CoV-2/FLU/RSV plus assay is intended as an aid in the diagnosis of influenza from Nasopharyngeal swab specimens and should not be used as a sole basis for treatment. Nasal washings and aspirates are unacceptable for Xpert Xpress SARS-CoV-2/FLU/RSV testing.  Fact Sheet  for Patients: EntrepreneurPulse.com.au  Fact Sheet for Healthcare Providers: IncredibleEmployment.be  This test is not yet approved or cleared by the [place] FDA and has been authorized for detection and/or diagnosis of SARS-CoV-2 by FDA under an Emergency Use Authorization (EUA). This EUA will remain in effect (meaning this test can be used) for the duration of the COVID-19 declaration under Section 564(b)(1) of the Act, 21 U.S.C. section 360bbb-3(b)(1), unless the authorization is terminated or revoked.  Performed at [facility], [place] [address] [place], [place] [address]      Patient was seen and examined on the day of discharge and was found to be in stable condition. Time coordinating discharge: 30 minutes including assessment and coordination of care, as well as examination of the patient.   SIGNED:  Dessa Phi, DO [facility] 11/10/2020, 11:02 AM

## 2020-11-10 NOTE — Progress Notes (Signed)
[facility] for Infectious Disease   Reason for visit: Follow up on Bacteremia  Interval History: culture growth with Acinetobacter ursingii, sensitivities pending.  No fever or chills again overnight.  Feels well and eating well.  No complaints. No associated rash or diarrhea.   3 days of levaquin  Physical Exam: Constitutional:  Vitals:   11/09/20 2103 11/10/20 0456  BP:  (!) 164/73  Pulse: 75 (!) 109  Resp:  18  Temp:  98.3 F (36.8 C)  SpO2:  99%   patient appears in NAD Respiratory: Normal respiratory effort; CTA B Cardiovascular: RRR GI: soft, nt, nd  Review of Systems: Constitutional: negative for fevers, chills, malaise and anorexia Gastrointestinal: positive for mild abdominal discomfort  Lab Results  Component Value Date   WBC 5.2 11/09/2020   HGB 9.2 (L) 11/09/2020   HCT 27.3 (L) 11/09/2020   MCV 89.8 11/09/2020   PLT 121 (L) 11/09/2020    Lab Results  Component Value Date   CREATININE 1.05 (H) 11/10/2020   BUN 28 (H) 11/10/2020   NA 140 11/10/2020   K 4.4 11/10/2020   CL 102 11/10/2020   CO2 29 11/10/2020    Lab Results  Component Value Date   ALT 83 (H) 11/10/2020   AST 19 11/10/2020   ALKPHOS 171 (H) 11/10/2020     Microbiology: Recent Results (from the past 240 hour(s))  Urine culture     Status: None   Collection Time: 11/06/20 11:29 PM   Specimen: In/Out Cath Urine  Result Value Ref Range Status   Specimen Description   Final    IN/OUT CATH URINE Performed at [facility], [place] [address], [place], [place] [address]    Special Requests   Final    NONE Performed at [facility], [place] [address], [place], [place] [address]    Culture   Final    NO GROWTH Performed at [facility], [place] [address] [place], [place] [address]    Report Status 11/08/2020 FINAL  Final  Blood Culture (routine x 2)     Status: Abnormal (Preliminary result)   Collection Time: 11/06/20 11:55 PM   Specimen:  BLOOD  Result Value Ref Range Status   Specimen Description   Final    BLOOD RIGHT ANTECUBITAL Performed at [facility], [place] [address], [place], [place] [address]    Special Requests   Final    BOTTLES DRAWN AEROBIC AND ANAEROBIC Blood Culture adequate volume Performed at [place] [address] [place], [place] [address]    Culture  Setup Time   Final    GRAM NEGATIVE RODS AEROBIC BOTTLE ONLY CRITICAL RESULT CALLED TO, READ BACK BY AND VERIFIED WITH: Shelda Jakes PHARMD, AT 1244 11/08/20 BY D. VANHOOK    Culture (A)  Final    ACINETOBACTER URSINGII Sent to [place] for further susceptibility testing. Performed at [facility], [place] [address], [place], [place] [address]    Report Status PENDING  Incomplete  Resp Panel by RT-PCR (Flu A&B, Covid) Nasopharyngeal Swab     Status: None   Collection Time: 11/07/20  1:46 AM   Specimen: Nasopharyngeal Swab; Nasopharyngeal(NP) swabs in vial transport medium  Result Value Ref Range Status   SARS Coronavirus 2 by RT PCR NEGATIVE NEGATIVE Final    Comment: (NOTE) SARS-CoV-2 target nucleic acids are NOT DETECTED.  The SARS-CoV-2 RNA is generally detectable in upper respiratory specimens during the acute  phase of infection. The lowest concentration of SARS-CoV-2 viral copies this assay can detect is 138 copies/mL. A negative result does not preclude SARS-Cov-2 infection and should not be used as the sole basis for treatment or other patient management decisions. A negative result may occur with  improper specimen collection/handling, submission of specimen other than nasopharyngeal swab, presence of viral mutation(s) within the areas targeted by this assay, and inadequate number of viral copies(<138 copies/mL). A negative result must be combined with clinical observations, patient history, and epidemiological information. The expected result is Negative.  Fact Sheet for Patients:   EntrepreneurPulse.com.au  Fact Sheet for Healthcare Providers:  IncredibleEmployment.be  This test is no t yet approved or cleared by the [place] FDA and  has been authorized for detection and/or diagnosis of SARS-CoV-2 by FDA under an Emergency Use Authorization (EUA). This EUA will remain  in effect (meaning this test can be used) for the duration of the COVID-19 declaration under Section 564(b)(1) of the Act, 21 U.S.C.section 360bbb-3(b)(1), unless the authorization is terminated  or revoked sooner.       Influenza A by PCR NEGATIVE NEGATIVE Final   Influenza B by PCR NEGATIVE NEGATIVE Final    Comment: (NOTE) The Xpert Xpress SARS-CoV-2/FLU/RSV plus assay is intended as an aid in the diagnosis of influenza from Nasopharyngeal swab specimens and should not be used as a sole basis for treatment. Nasal washings and aspirates are unacceptable for Xpert Xpress SARS-CoV-2/FLU/RSV testing.  Fact Sheet for Patients: EntrepreneurPulse.com.au  Fact Sheet for Healthcare Providers: IncredibleEmployment.be  This test is not yet approved or cleared by the [place] FDA and has been authorized for detection and/or diagnosis of SARS-CoV-2 by FDA under an Emergency Use Authorization (EUA). This EUA will remain in effect (meaning this test can be used) for the duration of the COVID-19 declaration under Section 564(b)(1) of the Act, 21 U.S.C. section 360bbb-3(b)(1), unless the authorization is terminated or revoked.  Performed at [facility], [place] [address] [place], [place] [address]     Impression/Plan:  1. Line associated bacteremia - growth with Acinetobacter and sensitivities not likely to return until next week.  I will change her to meropenem and plan for 10 more days of meropenem.   She has follow up with Dr. Tommy Medal on Monday and antibiotics can be changed at that time via home  health depending on the sensitivities, if they are back at that time.   Will order OPAT  2.  Access - will use same line at home and no indication at this time with above bacteria to exchange.  Will treat through infection.  3.  Medication monitoring - will monitor weekly labs per home health.  Ok from [place] standpoint for discharge today, we can monitor sensitivities after discharge and make changes then.

## 2020-11-10 NOTE — Discharge Instructions (Signed)
Anemia  Anemia is a condition in which you do not have enough red blood cells or hemoglobin. Hemoglobin is a substance in red blood cells that carries oxygen. When you do not have enough red blood cells or hemoglobin (are anemic), your body cannot get enough oxygen and your organs may not work properly. As a result, you may feel very tired or have other problems. What are the causes? Common causes of anemia include:  Excessive bleeding. Anemia can be caused by excessive bleeding inside or outside the body, including bleeding from the intestine or from periods in women.  Poor nutrition.  Long-lasting (chronic) kidney, thyroid, and liver disease.  Bone marrow disorders.  Cancer and treatments for cancer.  HIV (human immunodeficiency virus) and AIDS (acquired immunodeficiency syndrome).  Treatments for HIV and AIDS.  Spleen problems.  Blood disorders.  Infections, medicines, and autoimmune disorders that destroy red blood cells. What are the signs or symptoms? Symptoms of this condition include:  Minor weakness.  Dizziness.  Headache.  Feeling heartbeats that are irregular or faster than normal (palpitations).  Shortness of breath, especially with exercise.  Paleness.  Cold sensitivity.  Indigestion.  Nausea.  Difficulty sleeping.  Difficulty concentrating. Symptoms may occur suddenly or develop slowly. If your anemia is mild, you may not have symptoms. How is this diagnosed? This condition is diagnosed based on:  Blood tests.  Your medical history.  A physical exam.  Bone marrow biopsy. Your health care provider may also check your stool (feces) for blood and may do additional testing to look for the cause of your bleeding. You may also have other tests, including:  Imaging tests, such as a CT scan or MRI.  Endoscopy.  Colonoscopy. How is this treated? Treatment for this condition depends on the cause. If you continue to lose a lot of blood, you may  need to be treated at a hospital. Treatment may include:  Taking supplements of iron, vitamin S31, or folic acid.  Taking a hormone medicine (erythropoietin) that can help to stimulate red blood cell growth.  Having a blood transfusion. This may be needed if you lose a lot of blood.  Making changes to your diet.  Having surgery to remove your spleen. Follow these instructions at home:  Take over-the-counter and prescription medicines only as told by your health care provider.  Take supplements only as told by your health care provider.  Follow any diet instructions that you were given.  Keep all follow-up visits as told by your health care provider. This is important. Contact a health care provider if:  You develop new bleeding anywhere in the body. Get help right away if:  You are very weak.  You are short of breath.  You have pain in your abdomen or chest.  You are dizzy or feel faint.  You have trouble concentrating.  You have bloody or black, tarry stools.  You vomit repeatedly or you vomit up blood. Summary  Anemia is a condition in which you do not have enough red blood cells or enough of a substance in your red blood cells that carries oxygen (hemoglobin).  Symptoms may occur suddenly or develop slowly.  If your anemia is mild, you may not have symptoms.  This condition is diagnosed with blood tests as well as a medical history and physical exam. Other tests may be needed.  Treatment for this condition depends on the cause of the anemia. This information is not intended to replace advice given to you by  your health care provider. Make sure you discuss any questions you have with your health care provider. Document Revised: 10/25/2017 Document Reviewed: 12/14/2016 Elsevier Patient Education  [place].   Bacteremia, Adult Bacteremia is the presence of bacteria in the blood. When bacteria enter the bloodstream, they can cause a life-threatening  reaction called sepsis. Sepsis is a medical emergency. What are the causes? This condition is caused by bacteria that get into the blood. Bacteria can enter the blood from an infection, including:  A skin infection or injury, such as a burn or a cut.  A lung infection (pneumonia).  An infection in the stomach or intestines.  An infection in the bladder or urinary system (urinary tract infection).  A bacterial infection in another part of the body that spreads to the blood. Bacteria can also enter the blood during a dental or medical procedure, from bleeding gums, or through use of an unclean needle. What increases the risk? This condition is more likely to develop in children, older adults, and people who have:  A long-term (chronic) disease or condition like diabetes or chronic kidney failure.  An artificial joint or heart valve, or heart valve disease.  A tube inserted to treat a medical condition, such as a urinary catheter or IV.  A weak disease-fighting system (immune system).  Injected illegal drugs.  Been hospitalized for more than 10 days in a row. What are the signs or symptoms? Symptoms of this condition include:  Fever and chills.  Fast heartbeat and shortness of breath.  Dizziness, weakness, and low blood pressure.  Confusion or anxiety.  Pain in the abdomen, nausea, vomiting, and diarrhea. Bacteremia that has spread to other parts of the body may cause symptoms in those areas. In some cases, there are no symptoms. How is this diagnosed? This condition may be diagnosed with a physical exam and tests, such as:  Blood tests to check for bacteria (cultures) or other signs of infection.  Tests of any tubes that you have had inserted. These tests check for a source of infection.  Urine tests to check for bacteria in the urine.  Imaging tests, such as an X-ray, a CT scan, an MRI, or a heart ultrasound. These check for a source of infection in other parts of  your body, such as your lungs, heart valves, or joints. How is this treated? This condition is usually treated in the hospital. If you are treated at home, you may need to return to the hospital for medicines, blood tests, and evaluation. Treatment may include:  Antibiotic medicines. These may be given by mouth or directly into your blood through an IV. You may need antibiotics for several weeks. At first, you may be given an antibiotic to kill most types of blood bacteria. If tests show that a certain kind of bacteria is causing the problem, you may be given a different antibiotic.  IV fluids.  Removing any catheter or device that could be a source of infection.  Blood pressure and breathing support, if needed.  Surgery to control the source or the spread of infection, such as surgery to remove an implanted device, abscess, or infected tissue. Follow these instructions at home: Medicines  Take over-the-counter and prescription medicines only as told by your health care provider.  If you were prescribed an antibiotic medicine, take it as told by your health care provider. Do not stop taking the antibiotic even if you start to feel better. General instructions   Rest as  needed. Ask your health care provider when you may return to normal activities.  Drink enough fluid to keep your urine pale yellow.  Do not use any products that contain nicotine or tobacco, such as cigarettes, e-cigarettes, and chewing tobacco. If you need help quitting, ask your health care provider.  Keep all follow-up visits as told by your health care provider. This is important. How is this prevented?   Wash your hands regularly with soap and water. If soap and water are not available, use hand sanitizer.  You should wash your hands: ? After using the toilet or changing a diaper. ? Before preparing, cooking, serving, or eating food. ? While caring for a sick person or while visiting someone in a  hospital. ? Before and after changing bandages (dressings) over wounds.  Clean any scrapes or cuts with soap and water and cover them with a clean bandage.  Get vaccinations as recommended by your health care provider.  Practice good oral hygiene. Brush your teeth two times a day, and floss regularly.  Take good care of your skin. This includes bathing and moisturizing on a regular basis. Contact a health care provider if:  Your symptoms get worse, and medicines do not help.  You have severe pain. Get help right away if you have:  Pain.  A fever or chills.  Trouble breathing.  A fast heart rate.  Skin that is blotchy, pale, or clammy.  Confusion.  Weakness.  Lack of energy or unusual sleepiness.  New symptoms that develop after treatment has started. These symptoms may represent a serious problem that is an emergency. Do not wait to see if the symptoms will go away. Get medical help right away. Call your local emergency services (911 in the [place] Do not drive yourself to the hospital. Summary  Bacteremia is the presence of bacteria in the blood. When bacteria enter the bloodstream, they can cause a life-threatening reaction called sepsis.  Bacteremia is usually treated with antibiotic medicines in the hospital.  If you were prescribed an antibiotic medicine, take it as told by your health care provider. Do not stop taking the antibiotic even if you start to feel better.  Get help right away if you have any new symptoms that develop after treatment has started. This information is not intended to replace advice given to you by your health care provider. Make sure you discuss any questions you have with your health care provider. Document Revised: 04/03/2019 Document Reviewed: 04/03/2019 Elsevier Patient Education  [place].   Hypertension, Adult Hypertension is another name for high blood pressure. High blood pressure forces your heart to work harder to  pump blood. This can cause problems over time. There are two numbers in a blood pressure reading. There is a top number (systolic) over a bottom number (diastolic). It is best to have a blood pressure that is below 120/80. Healthy choices can help lower your blood pressure, or you may need medicine to help lower it. What are the causes? The cause of this condition is not known. Some conditions may be related to high blood pressure. What increases the risk?  Smoking.  Having type 2 diabetes mellitus, high cholesterol, or both.  Not getting enough exercise or physical activity.  Being overweight.  Having too much fat, sugar, calories, or salt (sodium) in your diet.  Drinking too much alcohol.  Having long-term (chronic) kidney disease.  Having a family history of high blood pressure.  Age. Risk increases with age.  Race. You may be at higher risk if you are African American.  Gender. Men are at higher risk than women before age 82. After age 39, women are at higher risk than men.  Having obstructive sleep apnea.  Stress. What are the signs or symptoms?  High blood pressure may not cause symptoms. Very high blood pressure (hypertensive crisis) may cause: ? Headache. ? Feelings of worry or nervousness (anxiety). ? Shortness of breath. ? Nosebleed. ? A feeling of being sick to your stomach (nausea). ? Throwing up (vomiting). ? Changes in how you see. ? Very bad chest pain. ? Seizures. How is this treated?  This condition is treated by making healthy lifestyle changes, such as: ? Eating healthy foods. ? Exercising more. ? Drinking less alcohol.  Your health care provider may prescribe medicine if lifestyle changes are not enough to get your blood pressure under control, and if: ? Your top number is above 130. ? Your bottom number is above 80.  Your personal target blood pressure may vary. Follow these instructions at home: Eating and drinking   If told, follow the  DASH eating plan. To follow this plan: ? Fill one half of your plate at each meal with fruits and vegetables. ? Fill one fourth of your plate at each meal with whole grains. Whole grains include whole-wheat pasta, brown rice, and whole-grain bread. ? Eat or drink low-fat dairy products, such as skim milk or low-fat yogurt. ? Fill one fourth of your plate at each meal with low-fat (lean) proteins. Low-fat proteins include fish, chicken without skin, eggs, beans, and tofu. ? Avoid fatty meat, cured and processed meat, or chicken with skin. ? Avoid pre-made or processed food.  Eat less than 1,500 mg of salt each day.  Do not drink alcohol if: ? Your doctor tells you not to drink. ? You are pregnant, may be pregnant, or are planning to become pregnant.  If you drink alcohol: ? Limit how much you use to:  0-1 drink a day for women.  0-2 drinks a day for men. ? Be aware of how much alcohol is in your drink. In the [place] one drink equals one 12 oz bottle of beer (355 mL), one 5 oz glass of wine (148 mL), or one 1 oz glass of hard liquor (44 mL). Lifestyle   Work with your doctor to stay at a healthy weight or to lose weight. Ask your doctor what the best weight is for you.  Get at least 30 minutes of exercise most days of the week. This may include walking, swimming, or biking.  Get at least 30 minutes of exercise that strengthens your muscles (resistance exercise) at least 3 days a week. This may include lifting weights or doing Pilates.  Do not use any products that contain nicotine or tobacco, such as cigarettes, e-cigarettes, and chewing tobacco. If you need help quitting, ask your doctor.  Check your blood pressure at home as told by your doctor.  Keep all follow-up visits as told by your doctor. This is important. Medicines  Take over-the-counter and prescription medicines only as told by your doctor. Follow directions carefully.  Do not skip doses of blood pressure medicine.  The medicine does not work as well if you skip doses. Skipping doses also puts you at risk for problems.  Ask your doctor about side effects or reactions to medicines that you should watch for. Contact a doctor if you:  Think you are having a reaction to  the medicine you are taking.  Have headaches that keep coming back (recurring).  Feel dizzy.  Have swelling in your ankles.  Have trouble with your vision. Get help right away if you:  Get a very bad headache.  Start to feel mixed up (confused).  Feel weak or numb.  Feel faint.  Have very bad pain in your: ? Chest. ? Belly (abdomen).  Throw up more than once.  Have trouble breathing. Summary  Hypertension is another name for high blood pressure.  High blood pressure forces your heart to work harder to pump blood.  For most people, a normal blood pressure is less than 120/80.  Making healthy choices can help lower blood pressure. If your blood pressure does not get lower with healthy choices, you may need to take medicine. This information is not intended to replace advice given to you by your health care provider. Make sure you discuss any questions you have with your health care provider. Document Revised: 07/23/2018 Document Reviewed: 07/23/2018 Elsevier Patient Education  2020 [employer].

## 2020-11-10 NOTE — ED Triage Notes (Signed)
Pt presents via EMS with c/o abdominal pain and nausea since 5 pm today. Pt was just d/c'd from the hospital around 1600 today. Hx of chronic nausea and pain. Pt did not take any of her rx's for her pain and nausea but wanted to come back here instead.

## 2020-11-11 ENCOUNTER — Other Ambulatory Visit: Payer: Self-pay | Admitting: Family Medicine

## 2020-11-11 ENCOUNTER — Ambulatory Visit: Payer: Medicare Other | Admitting: Internal Medicine

## 2020-11-11 ENCOUNTER — Other Ambulatory Visit: Payer: Self-pay | Admitting: Internal Medicine

## 2020-11-11 ENCOUNTER — Encounter (HOSPITAL_COMMUNITY): Payer: Self-pay

## 2020-11-11 ENCOUNTER — Emergency Department (HOSPITAL_COMMUNITY): Payer: Medicare Other

## 2020-11-11 DIAGNOSIS — K50819 Crohn's disease of both small and large intestine with unspecified complications: Secondary | ICD-10-CM

## 2020-11-11 DIAGNOSIS — G894 Chronic pain syndrome: Secondary | ICD-10-CM

## 2020-11-11 LAB — URINALYSIS, ROUTINE W REFLEX MICROSCOPIC
Bilirubin Urine: NEGATIVE
Glucose, UA: NEGATIVE mg/dL
Hgb urine dipstick: NEGATIVE
Ketones, ur: NEGATIVE mg/dL
Nitrite: NEGATIVE
Protein, ur: 30 mg/dL — AB
Specific Gravity, Urine: 1.032 — ABNORMAL HIGH (ref 1.005–1.030)
pH: 7 (ref 5.0–8.0)

## 2020-11-11 LAB — CBC
HCT: 31.2 % — ABNORMAL LOW (ref 36.0–46.0)
Hemoglobin: 10.1 g/dL — ABNORMAL LOW (ref 12.0–15.0)
MCH: 30.3 pg (ref 26.0–34.0)
MCHC: 32.4 g/dL (ref 30.0–36.0)
MCV: 93.7 fL (ref 80.0–100.0)
Platelets: 162 10*3/uL (ref 150–400)
RBC: 3.33 MIL/uL — ABNORMAL LOW (ref 3.87–5.11)
RDW: 15 % (ref 11.5–15.5)
WBC: 5.3 10*3/uL (ref 4.0–10.5)
nRBC: 0 % (ref 0.0–0.2)

## 2020-11-11 LAB — COMPREHENSIVE METABOLIC PANEL
ALT: 75 U/L — ABNORMAL HIGH (ref 0–44)
AST: 21 U/L (ref 15–41)
Albumin: 3.2 g/dL — ABNORMAL LOW (ref 3.5–5.0)
Alkaline Phosphatase: 195 U/L — ABNORMAL HIGH (ref 38–126)
Anion gap: 9 (ref 5–15)
BUN: 27 mg/dL — ABNORMAL HIGH (ref 6–20)
CO2: 25 mmol/L (ref 22–32)
Calcium: 9.3 mg/dL (ref 8.9–10.3)
Chloride: 105 mmol/L (ref 98–111)
Creatinine, Ser: 1.28 mg/dL — ABNORMAL HIGH (ref 0.44–1.00)
GFR, Estimated: 48 mL/min — ABNORMAL LOW (ref >60)
Glucose, Bld: 98 mg/dL (ref 70–99)
Potassium: 4.1 mmol/L (ref 3.5–5.1)
Sodium: 139 mmol/L (ref 135–145)
Total Bilirubin: 0.6 mg/dL (ref 0.3–1.2)
Total Protein: 7 g/dL (ref 6.5–8.1)

## 2020-11-11 LAB — LIPASE, BLOOD: Lipase: 22 U/L (ref 11–51)

## 2020-11-11 MED ORDER — HYDROMORPHONE HCL 1 MG/ML IJ SOLN
1.0000 mg | Freq: Once | INTRAMUSCULAR | Status: AC
Start: 2020-11-11 — End: 2020-11-11
  Administered 2020-11-11: 1 mg via INTRAVENOUS
  Filled 2020-11-11: qty 1

## 2020-11-11 MED ORDER — SODIUM CHLORIDE (PF) 0.9 % IJ SOLN
INTRAMUSCULAR | Status: AC
  Filled 2020-11-11: qty 50

## 2020-11-11 MED ORDER — SODIUM CHLORIDE 0.9 % IV SOLN
1.0000 g | Freq: Once | INTRAVENOUS | Status: AC
  Administered 2020-11-11: 1 g via INTRAVENOUS
  Filled 2020-11-11: qty 1

## 2020-11-11 MED ORDER — IOHEXOL 300 MG/ML  SOLN
100.0000 mL | Freq: Once | INTRAMUSCULAR | Status: AC | PRN
  Administered 2020-11-11: 100 mL via INTRAVENOUS

## 2020-11-11 MED ORDER — HYDROMORPHONE HCL 1 MG/ML IJ SOLN
0.5000 mg | Freq: Once | INTRAMUSCULAR | Status: AC
  Administered 2020-11-11: 0.5 mg via INTRAVENOUS
  Filled 2020-11-11: qty 1

## 2020-11-11 MED ORDER — PROMETHAZINE HCL 25 MG/ML IJ SOLN
12.5000 mg | Freq: Once | INTRAMUSCULAR | Status: AC
  Administered 2020-11-11: 12.5 mg via INTRAVENOUS
  Filled 2020-11-11: qty 1

## 2020-11-11 MED ORDER — HYDROMORPHONE HCL 1 MG/ML IJ SOLN
1.0000 mg | Freq: Once | INTRAMUSCULAR | Status: AC
  Administered 2020-11-11: 1 mg via INTRAVENOUS
  Filled 2020-11-11: qty 1

## 2020-11-11 MED ORDER — PROMETHAZINE HCL 25 MG/ML IJ SOLN
25.0000 mg | Freq: Once | INTRAMUSCULAR | Status: AC
  Administered 2020-11-11: 25 mg via INTRAVENOUS
  Filled 2020-11-11: qty 1

## 2020-11-11 MED ORDER — SODIUM CHLORIDE 0.9 % IV BOLUS (SEPSIS)
1000.0000 mL | Freq: Once | INTRAVENOUS | Status: AC
  Administered 2020-11-11: 1000 mL via INTRAVENOUS

## 2020-11-11 NOTE — Discharge Instructions (Addendum)
Your labs, urine and CT scan were reassuring today.  Please continue your meropenem as prescribed.

## 2020-11-11 NOTE — ED Notes (Signed)
Patient transported to CT 

## 2020-11-11 NOTE — ED Provider Notes (Signed)
TIME SEEN: 12:12 AM  CHIEF COMPLAINT: Abdominal pain  HPI: Patient is a 59 year old female with history of Crohn's disease with multiple previous abdominal surgeries with short gut syndrome, chronic PICC line with TPN infusion and history of recurrent PICC line infections who presents to the emergency department today with abdominal pain.  Patient was just in the hospital from 12/12-12/16 and had bacteremia due to actinobacter.  She was discharged on meropenem.  States since being discharged home she has had increasing diffuse abdominal pain with nausea.  She denies vomiting.  Has had a bowel movement today.  She is passing gas.  Has history of bowel obstructions.  States she feels "sluggish" and weak.  No fevers since discharge.  No vaginal bleeding, discharge, dysuria, hematuria.  ROS: See HPI Constitutional: no fever  Eyes: no drainage  ENT: no runny nose   Cardiovascular:  no chest pain  Resp: no SOB  GI: no vomiting GU: no dysuria Integumentary: no rash  Allergy: no hives  Musculoskeletal: no leg swelling  Neurological: no slurred speech ROS otherwise negative  PAST MEDICAL HISTORY/PAST SURGICAL HISTORY:  Past Medical History:  Diagnosis Date  . Acute pancreatitis 04/13/2020  . Anasarca 10/2019  . AVN (avascular necrosis of bone) ([place])   . Cataract   . Choledocholithiasis (sludge) s/p ERCP 10/2019 10/21/2020  . Chronic pain syndrome   . CKD (chronic kidney disease), stage III ([place])   . Crohn disease ([place])   . Crohn's disease of small & large intestine with SGS 1984   YARISSA REINING is a 59year old female with a history of Crohn's disease diagnosed in [birth year] (age 54), history of long-term steroid use with osteoporosis, S/P multiple bowel resections ([id number]) complicated by chronic back and abdominal pain, steatorrhea and short bowel syndrome. The patient has been left with ~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 attached to proximal transverse colon through rectum. She has been  . Depression   .  Diverticulosis   . GERD (gastroesophageal reflux disease)   . HTN (hypertension)   . IDA (iron deficiency anemia)   . Malnutrition ([place])   . Mass in chest   . Osteoporosis   . Pancreatitis   . 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 Vitamin B12 deficiency     MEDICATIONS:  Prior to Admission medications   Medication Sig Start Date End Date Taking? Authorizing Provider  amLODipine (NORVASC) 10 MG tablet Take 1 tablet (10 mg total) by mouth daily. 06/23/20 10/21/20 Yes Erline Hau, MD  acetaminophen (TYLENOL) 325 MG tablet Take 650 mg by mouth every 6 (six) hours as needed for mild pain.     [provider]  budesonide (ENTOCORT EC) 3 MG 24 hr capsule TAKE 3 CAPSULES BY MOUTH ONCE DAILY Patient taking differently: Take 3 mg by mouth daily. 04/05/20   [name], [name], MD  buPROPion [facility] SR) 100 MG 12 hr tablet Take 2 tablets by mouth once daily Patient taking differently: Take 200 mg by mouth daily. 07/04/20   [name], [name], MD  cholecalciferol (VITAMIN D3) 25 MCG (1000 UT) tablet Take 1,000 Units by mouth daily.     [provider]  cycloSPORINE (RESTASIS) 0.05 % ophthalmic emulsion Place 1 drop into both eyes 2 (  two) times daily.    [provider]  dexlansoprazole (DEXILANT) 60 MG capsule Take 1 capsule (60 mg total) by mouth daily. 06/28/20   [name], [name], MD  Dextran 70-Hypromellose 0.1-0.3 % SOLN Place 1 drop into both eyes 4 (four) times daily.    [provider]  diphenoxylate-atropine (LOMOTIL) 2.5-0.025 MG tablet Take 1 tablet by mouth 4 (four) times daily as needed for diarrhea or loose stools. 10/17/20   Burchette, Alinda Sierras, MD  DULoxetine  (CYMBALTA) 30 MG capsule Take 3 capsules (90 mg total) by mouth daily. 09/13/20   [name], [name], MD  estradiol (ESTRACE) 2 MG tablet Take 1 tablet (2 mg total) by mouth daily. 06/28/20   [name], [name], MD  hydrALAZINE (APRESOLINE) 50 MG tablet Take 1 tablet (50 mg total) by mouth 3 (three) times daily. Patient not taking: Reported on 11/07/2020 06/28/20   [name], [name], MD  HYDROmorphone (DILAUDID) 4 MG tablet Take 4 mg by mouth 4 (four) times daily as needed for moderate pain.  09/15/20   [provider]  lipase/protease/amylase (CREON) 36000 UNITS CPEP capsule Take 1 capsule (36,000 Units total) by mouth 3 (three) times daily as needed (with meals for digestion). 06/28/20   [name], [name], MD  loperamide (IMODIUM) 2 MG capsule TAKE 1 CAPSULE BY MOUTH AS NEEDED FOR DIARRHEA OR  LOOSE  STOOLS Patient taking differently: Take 2 mg by mouth as needed for diarrhea or loose stools. 07/29/20   [name], [name], MD  meropenem (MERREM) IVPB Inject 1 g into the vein every 8 (eight) hours for 11 days. Indication:  Bacteremia  First Dose: No Last Day of Therapy:  11/21/2020 Labs - Once weekly:  CBC/D and BMP, Labs - Every other week:  ESR and CRP Method of administration: Mini-Bag Plus / Gravity Method of administration may be changed at the discretion of home infusion pharmacist based upon assessment of the patient and/or caregiver's ability to self-administer the medication ordered. 11/10/20 11/21/20  Dessa Phi, DO  methadone (DOLOPHINE) 5 MG tablet Take 1 tablet (5 mg total) by mouth 5 (five) times daily. 06/30/20   Florencia Reasons, MD  metoprolol tartrate (LOPRESSOR) 100 MG tablet Take 1 tablet (100 mg total) by mouth 2 (two) times daily. 06/28/20   [name], [name], MD  Multiple Vitamins-Minerals (MULTIVITAMIN ADULT PO) Take 1 tablet by mouth daily.    [provider]  NARCAN 4 MG/0.1ML LIQD nasal spray kit Place 1 spray into the  nose once.  10/14/20   [provider]  PRESCRIPTION MEDICATION Inject 1 each into the vein daily. Home TPN . [facility] [place] . 1 bag for 12 hours. [phone]    [provider]  Probiotic Product (PROBIOTIC-10 PO) Take 1 capsule by mouth daily.     [provider]  promethazine (PHENERGAN) 25 MG tablet TAKE 1 TABLET BY MOUTH EVERY 6 HOURS AS NEEDED FOR NAUSEA AND VOMITING Patient taking differently: Take 25 mg by mouth every 6 (six) hours as needed for nausea or vomiting. 10/07/20   [name], [name], MD  sodium chloride 0.9 % infusion Inject into the vein. 10/16/20   [provider]  sucralfate (CARAFATE) 1 GM/10ML suspension Take 10 mLs (1 g total) by mouth 4 (four) times daily -  with meals and at bedtime. 06/28/20   [name], [name], MD  Trace Minerals Cu-Mn-Se-Zn (TRALEMENT IV) Inject 1 mL into  the vein. Used in hydration bag 4 times weekly    [provider]  traZODone (DESYREL) 100 MG tablet Take 2 tablets (200 mg total) by mouth at bedtime as needed for sleep. 09/13/20   [name], [name], MD  vitamin B-12 (CYANOCOBALAMIN) 100 MCG tablet Take 1,000 mcg by mouth daily.     [provider]    ALLERGIES:  Allergies  Allergen Reactions  . Cefepime Other (See Comments)    Neurotoxicity occurring in setting of AKI. Ceftriaxone tolerated during same admit  . Gabapentin Other (See Comments)    unknown  . Hyoscyamine Hives and Swelling    Legs swelling   Disorientation  . Lyrica [Pregabalin] Other (See Comments)    unknown  . Meperidine Hives    Other reaction(s): GI Upset Due to [place]   . Topamax [Topiramate] Other (See Comments)    unknown  . Zosyn [Piperacillin Sod-Tazobactam So]     Patient reports it makes her vomit, her neck stiff, and her "heart feel funny"  . Fentanyl Rash    Pt is allergic to fentanyl patch related to the glue (gives her a rash) Pt states she is  NOT allergic to fentanyl IV medicine  . Morphine And Related Rash    SOCIAL HISTORY:  Social History   Tobacco Use  . Smoking status: Former Research scientist (life sciences)  . Smokeless tobacco: Never Used  Substance Use Topics  . Alcohol use: Not Currently    FAMILY HISTORY: Family History  Problem Relation Age of Onset  . Breast cancer Sister   . Multiple sclerosis Sister   . Diabetes Sister   . Lupus Sister   . Colon cancer Other   . Crohn's disease Other   . Seizures Mother   . Glaucoma Mother   . CAD Father   . Heart disease Father   . Hypertension Father     EXAM: BP (!) 165/91 (BP Location: Right Arm)   Pulse 66   Temp 98.4 F (36.9 C) (Oral)   Resp 16   SpO2 99%  CONSTITUTIONAL: Alert and oriented and responds appropriately to questions.  Chronically ill-appearing, afebrile, nontoxic, no distress HEAD: Normocephalic EYES: Conjunctivae clear, pupils appear equal, EOM appear intact ENT: normal nose; moist mucous membranes NECK: Supple, normal ROM CARD: RRR; S1 and S2 appreciated; no murmurs, no clicks, no rubs, no gallops RESP: Normal chest excursion without splinting or tachypnea; breath sounds clear and equal bilaterally; no wheezes, no rhonchi, no rales, no hypoxia or respiratory distress, speaking full sentences ABD/GI: Normal bowel sounds; non-distended; soft, tender throughout the abdomen, no rebound, no guarding, no peritoneal signs, no hepatosplenomegaly BACK:  The back appears normal EXT: Normal ROM in all joints; no deformity noted, no edema; no cyanosis; PICC line noted to the right proximal lower extremity without surrounding redness, warmth, drainage or bleeding SKIN: Normal color for age and race; warm; no rash on exposed skin NEURO: Moves all extremities equally PSYCH: [name] mood and manner are appropriate.   MEDICAL DECISION MAKING: Patient here with history of Crohn's disease, bowel obstruction, multiple previous abdominal surgeries and recent bacteremia on  meropenem who presents with abdominal pain and nausea.  Was just discharged from the hospital yesterday.  Differential includes Crohn's flare, colitis, bowel obstruction, diverticulitis, UTI.  Will obtain CT imaging of her abdomen pelvis, labs, urine.  Will give IV fluids, pain and nausea medicine.  She has not yet had a dose of meropenem since being discharged from the hospital.  Will  provide with meropenem 1 g here in the ED.  ED PROGRESS: Labs show no acute abnormality.  CT scan also shows no acute abnormality.  She has pneumobilia and is status post cholecystectomy.  There is stable dilation of the biliary tree and pancreatic duct that is unchanged compared to previous.  Liver enzymes did not suggest obstructive process today.  She reports feeling better.  I feel she is safe to be discharged home.  She is tolerating p.o.   Of note, patient has had multiple heart rates documented in the 30s.  These were based off of her pulse oximeter.  Her heart rate is truly in the 70s.  She has no chest pain or shortness of breath.   EKG Interpretation  Date/Time:  Friday November 11 2020 04:19:39 EST Ventricular Rate:  58 PR Interval:    QRS Duration: 87 QT Interval:  414 QTC Calculation: 407 R Axis:   51 Text Interpretation: Sinus rhythm Atrial premature complexes Borderline prolonged PR interval Abnormal R-wave progression, early transition Consider left ventricular hypertrophy Confirmed by Pryor Curia [id number]) on 11/11/2020 4:24:38 AM        At this time, I do not feel there is any life-threatening condition present. I have reviewed, interpreted and discussed all results (EKG, imaging, lab, urine as appropriate) and exam findings with patient/family. I have reviewed nursing notes and appropriate previous records.  I feel the patient is safe to be discharged home without further emergent workup and can continue workup as an outpatient as needed. Discussed usual and customary return precautions.  Patient/family verbalize understanding and are comfortable with this plan.  Outpatient follow-up has been provided as needed. All questions have been answered.    FREDRICA CAPANO was evaluated in Emergency Department on 11/11/2020 for the symptoms described in the history of present illness. She was evaluated in the context of the global COVID-19 pandemic, which necessitated consideration that the patient might be at risk for infection with the SARS-CoV-2 virus that causes COVID-19. Institutional protocols and algorithms that pertain to the evaluation of patients at risk for COVID-19 are in a state of rapid change based on information released by regulatory bodies including the CDC and federal and state organizations. These policies and algorithms were followed during the patient's care in the ED.      Annalei Friesz, Delice Bison, DO 11/11/20 0425

## 2020-11-13 LAB — SUSCEPTIBILITY RESULT

## 2020-11-13 LAB — SUSCEPTIBILITY, AER + ANAEROB: Source of Sample: 8680

## 2020-11-14 ENCOUNTER — Encounter: Payer: Self-pay | Admitting: Infectious Disease

## 2020-11-14 ENCOUNTER — Other Ambulatory Visit: Payer: Self-pay

## 2020-11-14 ENCOUNTER — Ambulatory Visit (INDEPENDENT_AMBULATORY_CARE_PROVIDER_SITE_OTHER): Payer: Medicare Other | Admitting: Infectious Disease

## 2020-11-14 VITALS — BP 86/54 | HR 57 | Temp 97.7°F | Resp 16 | Ht 68.0 in | Wt 144.4 lb

## 2020-11-14 DIAGNOSIS — A498 Other bacterial infections of unspecified site: Secondary | ICD-10-CM | POA: Diagnosis not present

## 2020-11-14 DIAGNOSIS — T80219D Unspecified infection due to central venous catheter, subsequent encounter: Secondary | ICD-10-CM

## 2020-11-14 DIAGNOSIS — K912 Postsurgical malabsorption, not elsewhere classified: Secondary | ICD-10-CM | POA: Diagnosis not present

## 2020-11-14 DIAGNOSIS — K50819 Crohn's disease of both small and large intestine with unspecified complications: Secondary | ICD-10-CM | POA: Diagnosis not present

## 2020-11-14 NOTE — Progress Notes (Signed)
Subjective:    Patient ID: Caitlin Peters, female    DOB: 09/21/1961, 59 y.o.   MRN: [id number]  HPI    Subjective:  Chief complaint: followup for another line infection   Patient ID: Caitlin Peters, female    DOB: 01-31-1961, 59 y.o.   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  She currently has a femoral IV access PICC line that was exchanged during her most recent hospitalization when we saw her in February.  She was admitted in February with Enterobacter cloacae bacteremia she was treated with Zosyn then changed to ertapenem for ease of administration at home.  Bacteremia cleared and radiology were able to exchange a new line over her old IV access site.  She then unfortunately developed a stenotrophomonas bacteremia associated with her line.  There went PICC line exchange on 14 April.    She is initially treated with Bactrim which was complicated by acute on chronic elevation in her serum creatinine.  We changed her over to IV levofloxacin that she completed.  I then admitted her for severe abdominal pain which was worked up at [facility].  She then was admitted to [facility] and seen by my partner Dr. Megan Salon on 4 August. Fortunately her blood cultures were negative. A clear-cut cause of her fever was not ascertained. She was treated With levofloxacin that she completed the last visit when I saw her.  She  was again hospitalized September and treated for an Enterobacter bacteremia and seen by my partner Dr. Johnnye Sima.  Then in October she was again admitted she had some symptoms of fevers and also had some leukopenia blood cultures were taken but only through  the central line making it difficult to know whether this was a true positive blood culture.  It grew stenotrophomonas again.  Seen by Dr. Baxter Flattery and then by Dr. Gale Journey she had transthoracic echocardiogram that was negative for endocarditis.  She had an acute kidney injury on Bactrim and switched over levofloxacin on the 12th.  Her course was complicated a small bowel obstruction that has been now resolved.  I saw her in the clinic in October 2021.  She asked if her dressing could be changed as she says that she is concerned that it may have become wet when she showered this morning.  I asked if she took precautions of wrapping the bandage in cellophane.  She said that she did this.  [name] when she examined the patient was concerned that the patient has been changing her own dressings and that the bandage not appear to have been appropriately applied.  Referred her back to having it changed by her home health company.   Since she was readmitted in November with another line infection found to be Acinetobacter.  Dr. Linus Salmons saw her initially treated with levofloxacin based on her history of Enterobacter and stenotrophomonas.  In the interim she became more febrile and instead identification of the organism was awaited and it was found to be Acinetobacter    she was placed on meropenem which she was discharged on.  Susceptibility were not available at time of discharge but do not show the organism was Carbapenem susceptible  along with susceptibility to various other antibiotics as shown in epic  He states that she has had fevers on and on for several months which is not surprising giving her recurrent bloodstream infections  Before I came to see the patient nursing staff had mentioned that she had been very somnolent in the waiting room.  Apparently she was also that way in the exam room when taken in for vital signs we went into the room however she was standing upright and walking around the room and  appeared to be in no acute distress.  She says that GI at [facility] are contemplating the addition of a drug to help with absorption of nutritions in her gut.  Past Medical History:  Diagnosis Date  . Acute pancreatitis 04/13/2020  . Anasarca 10/2019  . AVN (avascular necrosis of bone) ([place])   . Cataract   . Choledocholithiasis (sludge) s/p ERCP 10/2019 10/21/2020  . Chronic pain syndrome   . CKD (chronic kidney disease), stage III ([place])   . Crohn disease ([place])   . Crohn's disease of small & large intestine with SGS 1984   Caitlin Peters is a 59year old female with a history of Crohn's disease diagnosed in 76 (age 34), history of long-term steroid use with osteoporosis, S/P multiple bowel resections (1610-9604) complicated by chronic back and abdominal pain, steatorrhea and short bowel syndrome. The patient has been left with ~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 attached to proximal transverse colon through rectum. She has been  . Depression   . Diverticulosis   . GERD (gastroesophageal reflux disease)   . HTN (hypertension)   . IDA (iron deficiency anemia)   . Malnutrition ([place])   . Mass in chest   . Osteoporosis   . Pancreatitis   . 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 Vitamin B12 deficiency     Past Surgical History:  Procedure Laterality Date  . ABDOMINAL ADHESION SURGERY  01/22/2018  . APPENDECTOMY  1989  . BILIARY DILATION  11/26/2019   Procedure: BILIARY DILATION;  Surgeon: Jackquline Denmark, MD;  Location: [facility];  Service: Endoscopy;;  . BILIARY DILATION  03/08/2020   Procedure: BILIARY DILATION;  Surgeon: Irving Copas., MD;  Location: [place];  Service: Gastroenterology;;  . BIOPSY  03/08/2020   Procedure: BIOPSY;   Surgeon: Irving Copas., MD;  Location: [facility];  Service: Gastroenterology;;  . CHEST WALL RESECTION     right thoracotomy,resection of chest mass with anterior rib and reconstruction using prosthetic mesh and video arthroscopy  . CHOLECYSTECTOMY  01/22/2018  . COLONOSCOPY  2019  . ENTEROSTOMY CLOSURE  04/1999  . ERCP N/A 11/26/2019   Procedure: ENDOSCOPIC RETROGRADE CHOLANGIOPANCREATOGRAPHY (ERCP);  Surgeon: Jackquline Denmark, MD;  Location: [facility];  Service: Endoscopy;  Laterality: N/A;  . ERCP N/A 03/08/2020   Procedure: ENDOSCOPIC RETROGRADE CHOLANGIOPANCREATOGRAPHY (ERCP);  Surgeon: Irving Copas., MD;  Location: [place];  Service: Gastroenterology;  Laterality: N/A;  . ESOPHAGOGASTRODUODENOSCOPY N/A 03/08/2020   Procedure: ESOPHAGOGASTRODUODENOSCOPY (EGD);  Surgeon: Irving Copas., MD;  Location: [place];  Service: Gastroenterology;  Laterality: N/A;  . EUS N/A 03/08/2020   Procedure: UPPER ENDOSCOPIC ULTRASOUND (EUS) LINEAR;  Surgeon: Irving Copas., MD;  Location: [place];  Service: Gastroenterology;  Laterality: N/A;  .  ILEOCECETOMY  03/1999   ileocolon resection with abdominal stoma  . ILEOSTOMY CLOSURE  2001  . IR FLUORO GUIDE CV LINE LEFT  01/07/2020  . IR FLUORO GUIDE CV LINE LEFT  03/09/2020  . IR FLUORO GUIDE CV LINE LEFT  05/09/2020  . IR FLUORO GUIDE CV LINE LEFT  07/20/2020  . IR FLUORO GUIDE CV LINE RIGHT  08/05/2020  . IR PTA VENOUS EXCEPT DIALYSIS CIRCUIT  01/07/2020  . IR REMOVAL TUN CV CATH W/O FL  08/05/2020  . IR US GUIDE VASC ACCESS LEFT     x 2 06/17/19 and 09/14/2019  . IR US GUIDE VASC ACCESS RIGHT  08/05/2020  . KNEE SURGERY     right knee   . LAPAROSCOPIC SMALL BOWEL RESECTION  2009   2000-2009.  SB resections for Crohns Disease - now with Short gut  . OMENTECTOMY  01/22/2018  . PARTIAL HYSTERECTOMY  1984   with LSO  . REMOVAL OF STONES  11/26/2019   Procedure: REMOVAL OF STONES;  Surgeon: Jackquline Denmark,  MD;  [facility];  Service: Endoscopy;;  . REMOVAL OF STONES  03/08/2020   Procedure: REMOVAL OF STONES;  Surgeon: Irving Copas., MD;  Location: [place];  Service: Gastroenterology;;  . SALPINGOOPHORECTOMY Left 1984  . SALPINGOOPHORECTOMY Right 1990  . SERIAL TRANSVERSE ENTEROPLASTY (STEP) - SMALL BOWEL LENGTHENING  01/22/2018   Dr Alene Mires, [place] length from 120 to 165cm   . SMALL INTESTINE SURGERY  2002  . SMALL INTESTINE SURGERY  2003  . SPHINCTEROTOMY  11/26/2019   Procedure: SPHINCTEROTOMY;  Surgeon: Jackquline Denmark, MD;  [facility];  Service: Endoscopy;;  . TOTAL ABDOMINAL HYSTERECTOMY  1990   with RSO  . UPPER GASTROINTESTINAL ENDOSCOPY      Family History  Problem Relation Age of Onset  . Breast cancer Sister   . Multiple sclerosis Sister   . Diabetes Sister   . Lupus Sister   . Colon cancer Other   . Crohn's disease Other   . Seizures Mother   . Glaucoma Mother   . CAD Father   . Heart disease Father   . Hypertension Father       Social History   Socioeconomic History  . Marital status: Single    Spouse name: Not on file  . Number of children: Not on file  . Years of education: Not on file  . Highest education level: Not on file  Occupational History  . Occupation: disabled  Tobacco Use  . Smoking status: Former Research scientist (life sciences)  . Smokeless tobacco: Never Used  Vaping Use  . Vaping Use: Never used  Substance and Sexual Activity  . Alcohol use: Not Currently  . Drug use: Never  . Sexual activity: Not on file  Other Topics Concern  . Not on file  Social History Narrative  . Not on file   Social Determinants of Health   Financial Resource Strain: Low Risk   . Difficulty of Paying Living Expenses: Not hard at all  Food Insecurity: Not on file  Transportation Needs: No Transportation Needs  . Lack of Transportation (Medical): No  . Lack of Transportation (Non-Medical): No  Physical Activity: Not on file   Stress: Not on file  Social Connections: Not on file    Allergies  Allergen Reactions  . Cefepime Other (See Comments)    Neurotoxicity occurring in setting of AKI. Ceftriaxone tolerated during same admit  . Gabapentin Other (See Comments)  unknown  . Hyoscyamine Hives and Swelling    Legs swelling   Disorientation  . Lyrica [Pregabalin] Other (See Comments)    unknown  . Meperidine Hives    Other reaction(s): GI Upset Due to Chrones   . Topamax [Topiramate] Other (See Comments)    unknown  . Zosyn [Piperacillin Sod-Tazobactam So]     Patient reports it makes her vomit, her neck stiff, and her "heart feel funny"  . Fentanyl Rash    Pt is allergic to fentanyl patch related to the glue (gives her a rash) Pt states she is NOT allergic to fentanyl IV medicine  . Morphine And Related Rash     Current Outpatient Medications:  .  acetaminophen (TYLENOL) 325 MG tablet, Take 650 mg by mouth every 6 (six) hours as needed for mild pain. , Disp: , Rfl:  .  amLODipine (NORVASC) 10 MG tablet, Take 1 tablet (10 mg total) by mouth daily., Disp: 90 tablet, Rfl: 1 .  budesonide (ENTOCORT EC) 3 MG 24 hr capsule, TAKE 3 CAPSULES BY MOUTH ONCE DAILY (Patient taking differently: Take 3 mg by mouth daily.), Disp: 90 capsule, Rfl: 0 .  buPROPion (WELLBUTRIN SR) 100 MG 12 hr tablet, Take 2 tablets by mouth once daily (Patient taking differently: Take 200 mg by mouth daily.), Disp: 60 tablet, Rfl: 5 .  Calcium 200 MG TABS, Take by mouth., Disp: , Rfl:  .  cholecalciferol (VITAMIN D3) 25 MCG (1000 UT) tablet, Take 1,000 Units by mouth daily. , Disp: , Rfl:  .  cycloSPORINE (RESTASIS) 0.05 % ophthalmic emulsion, Place 1 drop into both eyes 2 (two) times daily., Disp: , Rfl:  .  dexlansoprazole (DEXILANT) 60 MG capsule, Take 1 capsule (60 mg total) by mouth daily., Disp: 90 capsule, Rfl: 1 .  Dextran 70-Hypromellose 0.1-0.3 % SOLN, Place 1 drop into both eyes 4 (four) times daily., Disp: , Rfl:  .   diphenoxylate-atropine (LOMOTIL) 2.5-0.025 MG tablet, Take 1 tablet by mouth 4 (four) times daily as needed for diarrhea or loose stools., Disp: 30 tablet, Rfl: 0 .  DULoxetine (CYMBALTA) 30 MG capsule, Take 3 capsules (90 mg total) by mouth daily., Disp: 90 capsule, Rfl: 1 .  estradiol (ESTRACE) 2 MG tablet, Take 1 tablet (2 mg total) by mouth daily., Disp: 90 tablet, Rfl: 1 .  hydrALAZINE (APRESOLINE) 50 MG tablet, Take 1 tablet (50 mg total) by mouth 3 (three) times daily., Disp: 90 tablet, Rfl: 2 .  HYDROmorphone (DILAUDID) 4 MG tablet, Take 4 mg by mouth 4 (four) times daily as needed for moderate pain. , Disp: , Rfl:  .  lipase/protease/amylase (CREON) 36000 UNITS CPEP capsule, Take 1 capsule (36,000 Units total) by mouth 3 (three) times daily as needed (with meals for digestion)., Disp: 180 capsule, Rfl: 0 .  lipase/protease/amylase (CREON) 36000 UNITS CPEP capsule, Take by mouth., Disp: , Rfl:  .  methadone (DOLOPHINE) 5 MG tablet, Take 1 tablet (5 mg total) by mouth 5 (five) times daily., Disp: 30 tablet, Rfl: 0 .  metoprolol tartrate (LOPRESSOR) 100 MG tablet, Take 1 tablet (100 mg total) by mouth 2 (two) times daily., Disp: 90 tablet, Rfl: 1 .  Multiple Vitamins-Minerals (MULTIVITAMIN ADULT PO), Take 1 tablet by mouth daily., Disp: , Rfl:  .  NARCAN 4 MG/0.1ML LIQD nasal spray kit, Place 1 spray into the nose once. , Disp: , Rfl:  .  PRESCRIPTION MEDICATION, Inject 1 each into the vein daily. Home TPN . [facility] in  [place] . 1 bag for 12 hours. [phone], Disp: , Rfl:  .  Probiotic Product (PROBIOTIC-10 PO), Take 1 capsule by mouth daily. , Disp: , Rfl:  .  promethazine (PHENERGAN) 25 MG tablet, TAKE 1 TABLET BY MOUTH EVERY 6 HOURS AS NEEDED FOR NAUSEA AND VOMITING, Disp: 90 tablet, Rfl: 0 .  sodium chloride 0.9 % infusion, Inject into the vein., Disp: , Rfl:  .  sucralfate (CARAFATE) 1 GM/10ML suspension, Take 10 mLs (1 g total) by mouth 4 (four) times daily -  with  meals and at bedtime., Disp: 420 mL, Rfl: 2 .  Trace Minerals Cu-Mn-Se-Zn (TRALEMENT IV), Inject 1 mL into the vein. Used in hydration bag 4 times weekly, Disp: , Rfl:  .  traZODone (DESYREL) 100 MG tablet, Take 2 tablets (200 mg total) by mouth at bedtime as needed for sleep., Disp: 360 tablet, Rfl: 0 .  vitamin B-12 (CYANOCOBALAMIN) 100 MCG tablet, Take 1,000 mcg by mouth daily. , Disp: , Rfl:  .  loperamide (IMODIUM) 2 MG capsule, TAKE 1 CAPSULE BY MOUTH AS NEEDED FOR DIARRHEA OR  LOOSE  STOOLS (Patient taking differently: Take 2 mg by mouth as needed for diarrhea or loose stools.), Disp: 90 capsule, Rfl: 0 .  meropenem (MERREM) IVPB, Inject 1 g into the vein every 8 (eight) hours for 11 days. Indication:  Bacteremia  First Dose: No Last Day of Therapy:  11/21/2020 Labs - Once weekly:  CBC/D and BMP, Labs - Every other week:  ESR and CRP Method of administration: Mini-Bag Plus / Gravity Method of administration may be changed at the discretion of home infusion pharmacist based upon assessment of the patient and/or caregiver's ability to self-administer the medication ordered., Disp: 33 Units, Rfl: 0         Family History  Problem Relation Age of Onset  . Breast cancer Sister   . Multiple sclerosis Sister   . Diabetes Sister   . Lupus Sister   . Colon cancer Other   . Crohn's disease Other   . Seizures Mother   . Glaucoma Mother   . CAD Father   . Heart disease Father   . Hypertension Father       Social History   Socioeconomic History  . Marital status: Single    Spouse name: Not on file  . Number of children: Not on file  . Years of education: Not on file  . Highest education level: Not on file  Occupational History  . Occupation: disabled  Tobacco Use  . Smoking status: Former Research scientist (life sciences)  . Smokeless tobacco: Never Used  Vaping Use  . Vaping Use: Never used  Substance and Sexual Activity  . Alcohol use: Not Currently  . Drug use: Never  . Sexual activity: Not on file   Other Topics Concern  . Not on file  Social History Narrative  . Not on file   Social Determinants of Health   Financial Resource Strain: Low Risk   . Difficulty of Paying Living Expenses: Not hard at all  Food Insecurity: Not on file  Transportation Needs: No Transportation Needs  . Lack of Transportation (Medical): No  . Lack of Transportation (Non-Medical): No  Physical Activity: Not on file  Stress: Not on file  Social Connections: Not on file    Allergies  Allergen Reactions  . Cefepime Other (See Comments)    Neurotoxicity occurring in setting of AKI. Ceftriaxone tolerated during same admit  . Gabapentin Other (See Comments)  unknown  . Hyoscyamine Hives and Swelling    Legs swelling   Disorientation  . Lyrica [Pregabalin] Other (See Comments)    unknown  . Meperidine Hives    Other reaction(s): GI Upset Due to Chrones   . Topamax [Topiramate] Other (See Comments)    unknown  . Zosyn [Piperacillin Sod-Tazobactam So]     Patient reports it makes her vomit, her neck stiff, and her "heart feel funny"  . Fentanyl Rash    Pt is allergic to fentanyl patch related to the glue (gives her a rash) Pt states she is NOT allergic to fentanyl IV medicine  . Morphine And Related Rash     Current Outpatient Medications:  .  acetaminophen (TYLENOL) 325 MG tablet, Take 650 mg by mouth every 6 (six) hours as needed for mild pain. , Disp: , Rfl:  .  amLODipine (NORVASC) 10 MG tablet, Take 1 tablet (10 mg total) by mouth daily., Disp: 90 tablet, Rfl: 1 .  budesonide (ENTOCORT EC) 3 MG 24 hr capsule, TAKE 3 CAPSULES BY MOUTH ONCE DAILY (Patient taking differently: Take 3 mg by mouth daily.), Disp: 90 capsule, Rfl: 0 .  buPROPion (WELLBUTRIN SR) 100 MG 12 hr tablet, Take 2 tablets by mouth once daily (Patient taking differently: Take 200 mg by mouth daily.), Disp: 60 tablet, Rfl: 5 .  Calcium 200 MG TABS, Take by mouth., Disp: , Rfl:  .  cholecalciferol (VITAMIN D3) 25 MCG  (1000 UT) tablet, Take 1,000 Units by mouth daily. , Disp: , Rfl:  .  cycloSPORINE (RESTASIS) 0.05 % ophthalmic emulsion, Place 1 drop into both eyes 2 (two) times daily., Disp: , Rfl:  .  dexlansoprazole (DEXILANT) 60 MG capsule, Take 1 capsule (60 mg total) by mouth daily., Disp: 90 capsule, Rfl: 1 .  Dextran 70-Hypromellose 0.1-0.3 % SOLN, Place 1 drop into both eyes 4 (four) times daily., Disp: , Rfl:  .  diphenoxylate-atropine (LOMOTIL) 2.5-0.025 MG tablet, Take 1 tablet by mouth 4 (four) times daily as needed for diarrhea or loose stools., Disp: 30 tablet, Rfl: 0 .  DULoxetine (CYMBALTA) 30 MG capsule, Take 3 capsules (90 mg total) by mouth daily., Disp: 90 capsule, Rfl: 1 .  estradiol (ESTRACE) 2 MG tablet, Take 1 tablet (2 mg total) by mouth daily., Disp: 90 tablet, Rfl: 1 .  hydrALAZINE (APRESOLINE) 50 MG tablet, Take 1 tablet (50 mg total) by mouth 3 (three) times daily., Disp: 90 tablet, Rfl: 2 .  HYDROmorphone (DILAUDID) 4 MG tablet, Take 4 mg by mouth 4 (four) times daily as needed for moderate pain. , Disp: , Rfl:  .  lipase/protease/amylase (CREON) 36000 UNITS CPEP capsule, Take 1 capsule (36,000 Units total) by mouth 3 (three) times daily as needed (with meals for digestion)., Disp: 180 capsule, Rfl: 0 .  lipase/protease/amylase (CREON) 36000 UNITS CPEP capsule, Take by mouth., Disp: , Rfl:  .  methadone (DOLOPHINE) 5 MG tablet, Take 1 tablet (5 mg total) by mouth 5 (five) times daily., Disp: 30 tablet, Rfl: 0 .  metoprolol tartrate (LOPRESSOR) 100 MG tablet, Take 1 tablet (100 mg total) by mouth 2 (two) times daily., Disp: 90 tablet, Rfl: 1 .  Multiple Vitamins-Minerals (MULTIVITAMIN ADULT PO), Take 1 tablet by mouth daily., Disp: , Rfl:  .  NARCAN 4 MG/0.1ML LIQD nasal spray kit, Place 1 spray into the nose once. , Disp: , Rfl:  .  PRESCRIPTION MEDICATION, Inject 1 each into the vein daily. Home TPN . [facility] in  [place] . 1 bag for 12 hours. [phone], Disp: ,  Rfl:  .  Probiotic Product (PROBIOTIC-10 PO), Take 1 capsule by mouth daily. , Disp: , Rfl:  .  promethazine (PHENERGAN) 25 MG tablet, TAKE 1 TABLET BY MOUTH EVERY 6 HOURS AS NEEDED FOR NAUSEA AND VOMITING, Disp: 90 tablet, Rfl: 0 .  sodium chloride 0.9 % infusion, Inject into the vein., Disp: , Rfl:  .  sucralfate (CARAFATE) 1 GM/10ML suspension, Take 10 mLs (1 g total) by mouth 4 (four) times daily -  with meals and at bedtime., Disp: 420 mL, Rfl: 2 .  Trace Minerals Cu-Mn-Se-Zn (TRALEMENT IV), Inject 1 mL into the vein. Used in hydration bag 4 times weekly, Disp: , Rfl:  .  traZODone (DESYREL) 100 MG tablet, Take 2 tablets (200 mg total) by mouth at bedtime as needed for sleep., Disp: 360 tablet, Rfl: 0 .  vitamin B-12 (CYANOCOBALAMIN) 100 MCG tablet, Take 1,000 mcg by mouth daily. , Disp: , Rfl:  .  loperamide (IMODIUM) 2 MG capsule, TAKE 1 CAPSULE BY MOUTH AS NEEDED FOR DIARRHEA OR  LOOSE  STOOLS (Patient taking differently: Take 2 mg by mouth as needed for diarrhea or loose stools.), Disp: 90 capsule, Rfl: 0 .  meropenem (MERREM) IVPB, Inject 1 g into the vein every 8 (eight) hours for 11 days. Indication:  Bacteremia  First Dose: No Last Day of Therapy:  11/21/2020 Labs - Once weekly:  CBC/D and BMP, Labs - Every other week:  ESR and CRP Method of administration: Mini-Bag Plus / Gravity Method of administration may be changed at the discretion of home infusion pharmacist based upon assessment of the patient and/or caregiver's ability to self-administer the medication ordered., Disp: 33 Units, Rfl: 0   Review of Systems  Constitutional: Positive for fever. Negative for activity change, appetite change, chills, diaphoresis, fatigue and unexpected weight change.  HENT: Negative for congestion, rhinorrhea, sinus pressure, sneezing, sore throat and trouble swallowing.   Eyes: Negative for photophobia and visual disturbance.  Respiratory: Negative for cough, chest tightness, shortness of breath,  wheezing and stridor.   Cardiovascular: Negative for chest pain, palpitations and leg swelling.  Gastrointestinal: Negative for abdominal distention, abdominal pain, anal bleeding, blood in stool, constipation, diarrhea, nausea and vomiting.  Genitourinary: Negative for difficulty urinating, dysuria, flank pain and hematuria.  Musculoskeletal: Negative for arthralgias, back pain, gait problem, joint swelling and myalgias.  Skin: Negative for color change, pallor, rash and wound.  Neurological: Negative for dizziness, tremors, weakness and light-headedness.  Hematological: Negative for adenopathy. Does not bruise/bleed easily.  Psychiatric/Behavioral: Negative for agitation, behavioral problems, confusion, decreased concentration, dysphoric mood and sleep disturbance.       Objective:   Physical Exam Constitutional:      General: She is not in acute distress.    Appearance: Normal appearance. She is well-developed and well-nourished. She is not ill-appearing or diaphoretic.  HENT:     Head: Normocephalic and atraumatic.     Right Ear: Hearing and external ear normal.     Left Ear: Hearing and external ear normal.     Nose: No nasal deformity, rhinorrhea or epistaxis.  Eyes:     General: No scleral icterus.    Extraocular Movements: EOM normal.     Conjunctiva/sclera: Conjunctivae normal.     Right eye: Right conjunctiva is not injected.     Left eye: Left conjunctiva is not injected.     Pupils: Pupils are equal, round, and reactive to light.  Neck:     Vascular: No JVD.  Cardiovascular:     Rate and Rhythm: Normal rate and regular rhythm.     Heart sounds: Normal heart sounds, S1 normal and S2 normal. No murmur heard. No friction rub.  Pulmonary:     Effort: Pulmonary effort is normal. No respiratory distress.  Abdominal:     General: There is no distension or ascites.     Palpations: There is no hepatosplenomegaly.     Tenderness: There is no abdominal tenderness.   Musculoskeletal:        General: Normal range of motion.     Right shoulder: Normal.     Left shoulder: Normal.     Cervical back: Normal range of motion and neck supple.     Right hip: Normal.     Left hip: Normal.     Right knee: Normal.     Left knee: Normal.  Lymphadenopathy:     Head:     Right side of head: No submandibular, preauricular or posterior auricular adenopathy.     Left side of head: No submandibular, preauricular or posterior auricular adenopathy.     Cervical: No cervical adenopathy.     Right cervical: No superficial or deep cervical adenopathy.    Left cervical: No superficial or deep cervical adenopathy.  Skin:    General: Skin is warm, dry and intact.     Coloration: Skin is not pale.     Findings: No abrasion, bruising, ecchymosis, erythema, lesion or rash.     Nails: There is no clubbing or cyanosis.  Neurological:     General: No focal deficit present.     Mental Status: She is alert and oriented to person, place, and time.     Sensory: No sensory deficit.     Coordination: Coordination normal.     Gait: Gait normal.     Deep Tendon Reflexes: Strength normal.  Psychiatric:        Attention and Perception: She is attentive.        Mood and Affect: Mood and affect and mood normal.        Speech: Speech normal.        Behavior: Behavior normal. Behavior is cooperative.        Thought Content: Thought content normal.        Cognition and Memory: Cognition and memory normal.        Judgment: Judgment normal.           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  Will have her finish her abx.  I am sure we will see her in the  hospital again soon but I have scheduled her to see me in 6 weeks  Short gut syndrome with TPN dependencey: seeing GI at [facility]

## 2020-11-22 ENCOUNTER — Encounter: Payer: Self-pay | Admitting: *Deleted

## 2020-11-22 ENCOUNTER — Ambulatory Visit (INDEPENDENT_AMBULATORY_CARE_PROVIDER_SITE_OTHER): Payer: Medicare Other | Admitting: Internal Medicine

## 2020-11-22 ENCOUNTER — Other Ambulatory Visit: Payer: Self-pay

## 2020-11-22 ENCOUNTER — Encounter: Payer: Self-pay | Admitting: Internal Medicine

## 2020-11-22 VITALS — BP 170/80 | HR 64 | Ht 68.0 in | Wt 141.6 lb

## 2020-11-22 DIAGNOSIS — R002 Palpitations: Secondary | ICD-10-CM | POA: Insufficient documentation

## 2020-11-22 DIAGNOSIS — I491 Atrial premature depolarization: Secondary | ICD-10-CM | POA: Diagnosis not present

## 2020-11-22 DIAGNOSIS — I10 Essential (primary) hypertension: Secondary | ICD-10-CM | POA: Diagnosis not present

## 2020-11-22 MED ORDER — HYDRALAZINE HCL 50 MG PO TABS: 75.0000 mg | ORAL_TABLET | Freq: Three times a day (TID) | ORAL | 3 refills | Status: DC

## 2020-11-22 NOTE — Progress Notes (Signed)
Cardiology Office Note:    Date:  11/22/2020   ID:  Caitlin Peters, DOB October 01, 1961, MRN [id number]  PCP:  Caitlin Peters, Caitlin Halsted, MD  [place] Cardiologist:  No primary care provider on file.  [facility] [place] Electrophysiologist:  None   Referring MD: Caitlin Peters, [place]*   CC: Question of atrial fibrillation Consulted for the evaluation of small bowel transplant at the behest of Caitlin Peters, Caitlin Halsted, MD   History of Present Illness:    Caitlin Peters is a 59 y.o. female with a hx of HTN, CKD Stage III,  Aortic Atherosclerosis, short gut syndrome and Crohn's since age 41 with prior CLASBSI who presents for evaluation.  Patient seen [birth year] for complications from short gut syndrome, with multiple PICC line infections, weakness and fever. Started on antibiotics and DC 11/10/20.  Patient notes that she is feeling fine.  When she gets sick, everything goes out of whack.  Sometimes feels palpitations, but when she has fevers her heart rate becomes more erratic.  Patient notes that people in the ED tell her she has atrial fibrillation. Last had heart palpitations on 11/07/20.  Always occurs in the setting of febrile illness.  Has had no chest pain, chest pressure, chest tightness, chest stinging.  Patient exertion notable for daily walking and feels no symptoms.  No shortness of breath, DOE.   No syncope or near syncope.  Patient reports prior cardiac testing including 08/2020 echo, TEE's in the past; had distant heart catheterization prior to eval for transplant; prior stress tests.  Ambulatory BP 140-150/70s.   Past Medical History:  Diagnosis Date  . Acute pancreatitis 04/13/2020  . Anasarca 10/2019  . AVN (avascular necrosis of bone) ([place])   . Cataract   . Choledocholithiasis (sludge) s/p ERCP 10/2019 10/21/2020  . Chronic pain syndrome   . CKD (chronic kidney disease), stage III ([place])   . Crohn disease ([place])   .  Crohn's disease of small & large intestine with SGS 1984   Caitlin Peters is a 59year old female with a history of Crohn's disease diagnosed in 55 (age 18), history of long-term steroid use with osteoporosis, S/P multiple bowel resections ([id number]) complicated by chronic back and abdominal pain, steatorrhea and short bowel syndrome. The patient has been left with ~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 attached to proximal transverse colon through rectum. She has been  . Depression   . Diverticulosis   . GERD (gastroesophageal reflux disease)   . HTN (hypertension)   . IDA (iron deficiency anemia)   . Malnutrition ([place])   . Mass in chest   . Osteoporosis   . Pancreatitis   . 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; 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; jejunal to transverse colon anastomosis Treated at [place] SB lengthening to 165cm Dr Alene Mires, [facility]  ESTABLISHED AT [facility]  . Vitamin B12 deficiency     Past Surgical History:  Procedure Laterality Date  . ABDOMINAL ADHESION SURGERY  01/22/2018  . APPENDECTOMY  1989  . BILIARY DILATION  11/26/2019   Procedure: BILIARY DILATION;  Surgeon: Jackquline Denmark, MD;  Location: [facility];  Service: Endoscopy;;  . BILIARY DILATION  03/08/2020   Procedure: BILIARY DILATION;  Surgeon: Irving Copas., MD;  Location: [place];  Service: Gastroenterology;;  . BIOPSY  03/08/2020   Procedure: BIOPSY;  Surgeon: Irving Copas., MD;  Location:  [facility];  Service: Gastroenterology;;  . CHEST WALL RESECTION     right thoracotomy,resection of chest mass with anterior rib and reconstruction using prosthetic mesh and video arthroscopy  . CHOLECYSTECTOMY  01/22/2018  . COLONOSCOPY  2019  . ENTEROSTOMY CLOSURE  04/1999  . ERCP N/A 11/26/2019   Procedure: ENDOSCOPIC RETROGRADE CHOLANGIOPANCREATOGRAPHY (ERCP);  Surgeon: Jackquline Denmark, MD;  Location: [facility];   Service: Endoscopy;  Laterality: N/A;  . ERCP N/A 03/08/2020   Procedure: ENDOSCOPIC RETROGRADE CHOLANGIOPANCREATOGRAPHY (ERCP);  Surgeon: Irving Copas., MD;  Location: [place];  Service: Gastroenterology;  Laterality: N/A;  . ESOPHAGOGASTRODUODENOSCOPY N/A 03/08/2020   Procedure: ESOPHAGOGASTRODUODENOSCOPY (EGD);  Surgeon: Irving Copas., MD;  Location: [place];  Service: Gastroenterology;  Laterality: N/A;  . EUS N/A 03/08/2020   Procedure: UPPER ENDOSCOPIC ULTRASOUND (EUS) LINEAR;  Surgeon: Irving Copas., MD;  Location: [place];  Service: Gastroenterology;  Laterality: N/A;  . ILEOCECETOMY  03/1999   ileocolon resection with abdominal stoma  . ILEOSTOMY CLOSURE  2001  . IR FLUORO GUIDE CV LINE LEFT  01/07/2020  . IR FLUORO GUIDE CV LINE LEFT  03/09/2020  . IR FLUORO GUIDE CV LINE LEFT  05/09/2020  . IR FLUORO GUIDE CV LINE LEFT  07/20/2020  . IR FLUORO GUIDE CV LINE RIGHT  08/05/2020  . IR PTA VENOUS EXCEPT DIALYSIS CIRCUIT  01/07/2020  . IR REMOVAL TUN CV CATH W/O FL  08/05/2020  . IR US GUIDE VASC ACCESS LEFT     x 2 06/17/19 and 09/14/2019  . IR US GUIDE VASC ACCESS RIGHT  08/05/2020  . KNEE SURGERY     right knee   . LAPAROSCOPIC SMALL BOWEL RESECTION  2009   2000-2009.  SB resections for Crohns Disease - now with Short gut  . OMENTECTOMY  01/22/2018  . PARTIAL HYSTERECTOMY  1984   with LSO  . REMOVAL OF STONES  11/26/2019   Procedure: REMOVAL OF STONES;  Surgeon: Jackquline Denmark, MD;  [facility]: [facility];  Service: Endoscopy;;  . REMOVAL OF STONES  03/08/2020   Procedure: REMOVAL OF STONES;  Surgeon: Irving Copas., MD;  Location: [place];  Service: Gastroenterology;;  . SALPINGOOPHORECTOMY Left 1984  . SALPINGOOPHORECTOMY Right 1990  . SERIAL TRANSVERSE ENTEROPLASTY (STEP) - SMALL BOWEL LENGTHENING  01/22/2018   Dr Alene Mires, [place] length from 120 to 165cm   . SMALL INTESTINE SURGERY  2002  . SMALL INTESTINE  SURGERY  2003  . SPHINCTEROTOMY  11/26/2019   Procedure: SPHINCTEROTOMY;  Surgeon: Jackquline Denmark, MD;  [facility];  Service: Endoscopy;;  . TOTAL ABDOMINAL HYSTERECTOMY  1990   with RSO  . UPPER GASTROINTESTINAL ENDOSCOPY      Current Medications: Current Meds  Medication Sig  . acetaminophen (TYLENOL) 325 MG tablet Take 650 mg by mouth every 6 (six) hours as needed for mild pain.   [name] Kitchen amLODipine (NORVASC) 10 MG tablet Take 1 tablet (10 mg total) by mouth daily.  . budesonide (ENTOCORT EC) 3 MG 24 hr capsule TAKE 3 CAPSULES BY MOUTH ONCE DAILY  . buPROPion (WELLBUTRIN SR) 100 MG 12 hr tablet Take 2 tablets by mouth once daily  . Calcium 200 MG TABS Take by mouth.  . cholecalciferol (VITAMIN D3) 25 MCG (1000 UT) tablet Take 1,000 Units by mouth daily.   . cycloSPORINE (RESTASIS) 0.05 % ophthalmic emulsion Place 1 drop into both eyes 2 (two) times daily.  [name] Kitchen dexlansoprazole (DEXILANT) 60 MG capsule Take 1 capsule (60  mg total) by mouth daily.  [name] Kitchen Dextran 70-Hypromellose 0.1-0.3 % SOLN Place 1 drop into both eyes 4 (four) times daily.  . diphenoxylate-atropine (LOMOTIL) 2.5-0.025 MG tablet Take 1 tablet by mouth 4 (four) times daily as needed for diarrhea or loose stools.  . DULoxetine (CYMBALTA) 30 MG capsule Take 3 capsules (90 mg total) by mouth daily.  [name] Kitchen estradiol (ESTRACE) 2 MG tablet Take 1 tablet (2 mg total) by mouth daily.  . hydrALAZINE (APRESOLINE) 50 MG tablet Take 1 tablet (50 mg total) by mouth 3 (three) times daily.  [name] Kitchen HYDROmorphone (DILAUDID) 4 MG tablet Take 4 mg by mouth 4 (four) times daily as needed for moderate pain.   [name] Kitchen lipase/protease/amylase (CREON) 36000 UNITS CPEP capsule Take 1 capsule (36,000 Units total) by mouth 3 (three) times daily as needed (with meals for digestion).  [name] Kitchen lipase/protease/amylase (CREON) 36000 UNITS CPEP capsule Take by mouth.  . loperamide (IMODIUM) 2 MG capsule Take 2 mg by mouth as needed for diarrhea or loose stools.  . methadone  (DOLOPHINE) 5 MG tablet Take 1 tablet (5 mg total) by mouth 5 (five) times daily.  . metoprolol tartrate (LOPRESSOR) 100 MG tablet Take 1 tablet (100 mg total) by mouth 2 (two) times daily.  . Multiple Vitamins-Minerals (MULTIVITAMIN ADULT PO) Take 1 tablet by mouth daily.  [name] Kitchen NARCAN 4 MG/0.1ML LIQD nasal spray kit Place 1 spray into the nose once.   [name] Kitchen PRESCRIPTION MEDICATION Inject 1 each into the vein daily. Home TPN . [facility] in [facility] [place] . 1 bag for 12 hours. [phone]  . Probiotic Product (PROBIOTIC-10 PO) Take 1 capsule by mouth daily.   . promethazine (PHENERGAN) 25 MG tablet TAKE 1 TABLET BY MOUTH EVERY 6 HOURS AS NEEDED FOR NAUSEA AND VOMITING  . sodium chloride 0.9 % infusion Inject into the vein.  [name] Kitchen sucralfate (CARAFATE) 1 GM/10ML suspension Take 10 mLs (1 g total) by mouth 4 (four) times daily -  with meals and at bedtime.  . Trace Minerals Cu-Mn-Se-Zn (TRALEMENT IV) Inject 1 mL into the vein. Used in hydration bag 4 times weekly  . traZODone (DESYREL) 100 MG tablet Take 2 tablets (200 mg total) by mouth at bedtime as needed for sleep.  . vitamin B-12 (CYANOCOBALAMIN) 100 MCG tablet Take 1,000 mcg by mouth daily.   . [DISCONTINUED] loperamide (IMODIUM) 2 MG capsule TAKE 1 CAPSULE BY MOUTH AS NEEDED FOR DIARRHEA OR  LOOSE  STOOLS (Patient taking differently: TAKE 1 CAPSULE BY MOUTH AS NEEDED FOR DIARRHEA OR  LOOSE  STOOLS)     Allergies:   Cefepime, Gabapentin, Hyoscyamine, Lyrica [pregabalin], Meperidine, Topamax [topiramate], Zosyn [piperacillin sod-tazobactam so], Fentanyl, and Morphine and related   Social History   Socioeconomic History  . Marital status: Single    Spouse name: Not on file  . Number of children: Not on file  . Years of education: Not on file  . Highest education level: Not on file  Occupational History  . Occupation: disabled  Tobacco Use  . Smoking status: Former Research scientist (life sciences)  . Smokeless tobacco: Never Used  Vaping Use  . Vaping Use:  Never used  Substance and Sexual Activity  . Alcohol use: Not Currently  . Drug use: Never  . Sexual activity: Not on file  Other Topics Concern  . Not on file  Social History Narrative  . Not on file   Social Determinants of Health   Financial Resource Strain: Low Risk   . Difficulty of Paying Living  Expenses: Not hard at all  Food Insecurity: Not on file  Transportation Needs: No Transportation Needs  . Lack of Transportation (Medical): No  . Lack of Transportation (Non-Medical): No  Physical Activity: Not on file  Stress: Not on file  Social Connections: Not on file     Family History: [name] family history includes Breast cancer in her sister; CAD in her father; Colon cancer in an other family member; Crohn's disease in an other family member; Diabetes in her sister; Glaucoma in her mother; Heart disease in her father; Hypertension in her father; Lupus in her sister; Multiple sclerosis in her sister; Seizures in her mother.  History of coronary artery disease notable for father.  ROS:   Please see the history of present illness.    All other systems reviewed and are negative.  EKGs/Labs/Other Studies Reviewed:    The following studies were reviewed today:  EKG:   11/11/20: Sinus Bradycardia with 1st Degree HB, rate 58 with rare PAC, borderline LVH  Transthoracic Echocardiogram: Date:09/03/20 Results: 1. Left ventricular ejection fraction, by estimation, is 60 to 65%. The  left ventricle has normal function. The left ventricle has no regional  wall motion abnormalities. There is mild concentric left ventricular  hypertrophy. Left ventricular diastolic  parameters were normal.  2. Right ventricular systolic function is normal. The right ventricular  size is normal. There is normal pulmonary artery systolic pressure.  3. Left atrial size was moderately dilated.  4. The mitral valve is normal in structure. No evidence of mitral valve  regurgitation. No  evidence of mitral stenosis.  5. The aortic valve is normal in structure. Aortic valve regurgitation is  not visualized. No aortic stenosis is present.  6. The inferior vena cava is normal in size with greater than 50%  respiratory variability, suggesting right atrial pressure of 3 mmHg.   Recent Labs: 11/26/2019: B Natriuretic Peptide 179.1 08/10/2020: TSH 1.070 11/10/2020: ALT 83; BUN 28; Creatinine, Ser 1.05; Hemoglobin 10.1; Magnesium 2.3; Platelets 162; Potassium 4.4; Sodium 140  Recent Lipid Panel    Component Value Date/Time   TRIG 31 11/08/2020 0530     Risk Assessment/Calculations:     N/A  Physical Exam:    VS:  BP (!) 170/80   Pulse 64   Ht 5' 8"  (1.727 m)   Wt 141 lb 9.6 oz (64.2 kg)   SpO2 93%   BMI 21.53 kg/m     Wt Readings from Last 3 Encounters:  11/22/20 141 lb 9.6 oz (64.2 kg)  11/14/20 144 lb 6.4 oz (65.5 kg)  11/07/20 134 lb 14.7 oz (61.2 kg)    Repeat BP: 170/80  GEN:  Well nourished, well developed in no acute distress HEENT: Normal NECK: No JVD; No carotid bruits LYMPHATICS: No lymphadenopathy CARDIAC: RRR, no murmurs, rubs, gallops RESPIRATORY:  Clear to auscultation without rales, wheezing or rhonchi  ABDOMEN: Soft, non-tender, non-distended MUSCULOSKELETAL:  No edema; No deformity  SKIN: Warm and dry NEUROLOGIC:  Alert and oriented x 3 PSYCHIATRIC:  Normal affect   ASSESSMENT:    1. Palpitations   2. PAC (premature atrial contraction)   3. HTN (hypertension), benign    PLAN:    In order of problems listed above:  PACs and palpitations - will obtain 30-day live heart monitor ([employer]) - AV Nodal Therapy: continue current medication (asymptomatic bradycardia with 1st degree HB)  Essential Hypertension - ambulatory blood pressure 145/75, will continue ambulatory BP monitoring; gave education on how to perform ambulatory blood  pressure monitoring including the frequency and technique; goal ambulatory blood pressure < 135/85  on average - continue home medications with the exception of hydralazine to 75 mg TID; can change metoprolol to labetalol if further BP issues - discussed diet (DASH/low sodium), and exercise/weight loss interventions  3 month follow up unless new symptoms or abnormal test results warranting change in plan  Would be reasonable for  Virtual Follow up  Would be reasonable for  APP Follow up    Medication Adjustments/Labs and Tests Ordered: Current medicines are reviewed at length with the patient today.  Concerns regarding medicines are outlined above.  No orders of the defined types were placed in this encounter.  No orders of the defined types were placed in this encounter.   There are no Patient Instructions on file for this visit.   Signed, Werner Lean, MD  11/22/2020 10:29 AM    [place]

## 2020-11-22 NOTE — Patient Instructions (Signed)
Medication Instructions:  Your physician has recommended you make the following change in your medication:  1.  INCREASE the Hydralazine to 50 mg taking 1 1/2 tablet three times a day   *If you need a refill on your cardiac medications before your next appointment, please call your pharmacy*   Lab Work: None ordered  If you have labs (blood work) drawn today and your tests are completely normal, you will receive your results only by:  [place] (if you have MyChart) OR  A paper copy in the mail If you have any lab test that is abnormal or we need to change your treatment, we will call you to review the results.   Testing/Procedures: Your physician has recommended that you wear an event monitor. Event monitors are medical devices that record the hearts electrical activity. Doctors most often [place] these monitors to diagnose arrhythmias. Arrhythmias are problems with the speed or rhythm of the heartbeat. The monitor is a small, portable device. You can wear one while you do your normal daily activities. This is usually used to diagnose what is causing palpitations/syncope (passing out). SEE INSTRUCTIONS BELOW:  [employer] Cardiac Event Monitor Instructions Your physician has requested you wear your cardiac event monitor for __30___ days, (1-30). [employer] may call or text to confirm a shipping address. The monitor will be sent to a land address via UPS. [employer] will not ship a monitor to a PO BOX. It typically takes 3-5 days to receive your monitor after it has been enrolled. [employer] will assist with USPS tracking if your package is delayed. The telephone number for [employer] is [phone]. Once you have received your monitor, please review the enclosed instructions. Instruction tutorials can also be viewed under help and settings on the enclosed cell phone. Your monitor has already been registered assigning a specific monitor serial # to you.  Applying the  monitor Remove cell phone from case and turn it on. The cell phone works as Dealer and needs to be within [employer] of you at all times. The cell phone will need to be charged on a daily basis. We recommend you plug the cell phone into the enclosed charger at your bedside table every night.  Monitor batteries: You will receive two monitor batteries labelled #1 and #2. These are your recorders. Plug battery #2 onto the second connection on the enclosed charger. Keep one battery on the charger at all times. This will keep the monitor battery deactivated. It will also keep it fully charged for when you need to switch your monitor batteries. A small light will be blinking on the battery emblem when it is charging. The light on the battery emblem will remain on when the battery is fully charged.  Open package of a Monitor strip. Insert battery #1 into black hood on strip and gently squeeze monitor battery onto connection as indicated in instruction booklet. Set aside while preparing skin.  Choose location for your strip, vertical or horizontal, as indicated in the instruction booklet. Shave to remove all hair from location. There cannot be any lotions, oils, powders, or colognes on skin where monitor is to be applied. Wipe skin clean with enclosed Saline wipe. Dry skin completely.  Peel paper labeled #1 off the back of the Monitor strip exposing the adhesive. Place the monitor on the chest in the vertical or horizontal position shown in the instruction booklet. One arrow on the monitor strip must be pointing upward. Carefully remove paper labeled #2, attaching remainder of  strip to your skin. Try not to create any folds or wrinkles in the strip as you apply it.  Firmly press and release the circle in the center of the monitor battery. You will hear a small beep. This is turning the monitor battery on. The heart emblem on the monitor battery will light up every 5 seconds if the monitor  battery in turned on and connected to the patient securely. Do not push and hold the circle down as this turns the monitor battery off. The cell phone will locate the monitor battery. A screen will appear on the cell phone checking the connection of your monitor strip. This may read poor connection initially but change to good connection within the next minute. Once your monitor accepts the connection you will hear a series of 3 beeps followed by a climbing crescendo of beeps. A screen will appear on the cell phone showing the two monitor strip placement options. Touch the picture that demonstrates where you applied the monitor strip.  Your monitor strip and battery are waterproof. You are able to shower, bathe, or swim with the monitor on. They just ask you do not submerge deeper than 3 feet underwater. We recommend removing the monitor if you are swimming in a lake, river, or ocean.  Your monitor battery will need to be switched to a fully charged monitor battery approximately once a week. The cell phone will alert you of an action which needs to be made.  On the cell phone, tap for details to reveal connection status, monitor battery status, and cell phone battery status. The green dots indicates your monitor is in good status. A red dot indicates there is something that needs your attention.  To record a symptom, click the circle on the monitor battery. In 30-60 seconds a list of symptoms will appear on the cell phone. Select your symptom and tap save. Your monitor will record a sustained or significant arrhythmia regardless of you clicking the button. Some patients do not feel the heart rhythm irregularities. [employer] will notify us of any serious or critical events.  Refer to instruction booklet for instructions on switching batteries, changing strips, the Do not disturb or Pause features, or any additional questions.  Call [employer] at [phone], to confirm your monitor is  transmitting and record your baseline. They will answer any questions you may have regarding the monitor instructions at that time.  Returning the monitor to [place] all equipment back into blue box. Peel off strip of paper to expose adhesive and close box securely. There is a prepaid UPS shipping label on this box. Drop in a UPS drop box, or at a UPS facility like [employer]. You may also contact [employer] to arrange UPS to pick up monitor package at your home.      Follow-Up: At [facility], you and your health needs are our priority.  As part of our continuing mission to provide you with exceptional heart care, we have created designated Provider Care Teams.  These Care Teams include your primary Cardiologist (physician) and Advanced Practice Providers (APPs -  Physician Assistants and Nurse Practitioners) who all work together to provide you with the care you need, when you need it.  We recommend signing up for the patient portal called "MyChart".  Sign up information is provided on this After Visit Summary.  MyChart is used to connect with patients for Virtual Visits (Telemedicine).  Patients are able to view lab/test results, encounter notes, upcoming appointments, etc.  Non-urgent messages can be sent to your provider as well.   To learn more about what you can do with MyChart, go to [email or web address].    Your next appointment:   3 month(s)  The format for your next appointment:   In Person  Provider:   Rudean Haskell, MD   Other Instructions

## 2020-11-22 NOTE — Progress Notes (Signed)
Patient ID: Caitlin Peters, female   DOB: 1961/03/27, 59 y.o.   MRN: [id number] Patient enrolled for [employer] to ship a 30 day cardiac event monitor to her home.

## 2020-11-22 NOTE — Addendum Note (Signed)
Addended by: Gaetano Net on: 11/22/2020 10:38 AM   Modules accepted: Orders

## 2020-11-25 ENCOUNTER — Ambulatory Visit (INDEPENDENT_AMBULATORY_CARE_PROVIDER_SITE_OTHER): Payer: Medicare Other

## 2020-11-25 DIAGNOSIS — I10 Essential (primary) hypertension: Secondary | ICD-10-CM | POA: Diagnosis not present

## 2020-11-25 DIAGNOSIS — I491 Atrial premature depolarization: Secondary | ICD-10-CM

## 2020-11-25 DIAGNOSIS — R002 Palpitations: Secondary | ICD-10-CM | POA: Diagnosis not present

## 2020-11-27 LAB — CULTURE, BLOOD (ROUTINE X 2): Special Requests: ADEQUATE

## 2020-12-05 ENCOUNTER — Encounter (HOSPITAL_COMMUNITY): Payer: Self-pay

## 2020-12-05 ENCOUNTER — Encounter (HOSPITAL_COMMUNITY): Payer: Medicare Other

## 2020-12-05 ENCOUNTER — Other Ambulatory Visit: Payer: Self-pay

## 2020-12-05 ENCOUNTER — Emergency Department (HOSPITAL_COMMUNITY): Payer: Medicare Other

## 2020-12-05 ENCOUNTER — Other Ambulatory Visit (HOSPITAL_COMMUNITY): Payer: Self-pay | Admitting: *Deleted

## 2020-12-05 ENCOUNTER — Inpatient Hospital Stay (HOSPITAL_COMMUNITY)
Admission: EM | Admit: 2020-12-05 | Discharge: 2020-12-08 | DRG: 177 | Disposition: A | Discharge status: Home Health | Payer: Medicare Other | Attending: Internal Medicine | Admitting: Internal Medicine

## 2020-12-05 DIAGNOSIS — R509 Fever, unspecified: Secondary | ICD-10-CM

## 2020-12-05 DIAGNOSIS — K219 Gastro-esophageal reflux disease without esophagitis: Secondary | ICD-10-CM | POA: Diagnosis present

## 2020-12-05 DIAGNOSIS — Z833 Family history of diabetes mellitus: Secondary | ICD-10-CM | POA: Diagnosis not present

## 2020-12-05 DIAGNOSIS — N184 Chronic kidney disease, stage 4 (severe): Secondary | ICD-10-CM

## 2020-12-05 DIAGNOSIS — Z832 Family history of diseases of the blood and blood-forming organs and certain disorders involving the immune mechanism: Secondary | ICD-10-CM

## 2020-12-05 DIAGNOSIS — E43 Unspecified severe protein-calorie malnutrition: Secondary | ICD-10-CM | POA: Diagnosis present

## 2020-12-05 DIAGNOSIS — K50819 Crohn's disease of both small and large intestine with unspecified complications: Secondary | ICD-10-CM | POA: Diagnosis present

## 2020-12-05 DIAGNOSIS — R7989 Other specified abnormal findings of blood chemistry: Secondary | ICD-10-CM | POA: Diagnosis present

## 2020-12-05 DIAGNOSIS — D509 Iron deficiency anemia, unspecified: Secondary | ICD-10-CM | POA: Diagnosis present

## 2020-12-05 DIAGNOSIS — G894 Chronic pain syndrome: Secondary | ICD-10-CM | POA: Diagnosis not present

## 2020-12-05 DIAGNOSIS — Z820 Family history of epilepsy and other diseases of the nervous system: Secondary | ICD-10-CM | POA: Diagnosis not present

## 2020-12-05 DIAGNOSIS — I129 Hypertensive chronic kidney disease with stage 1 through stage 4 chronic kidney disease, or unspecified chronic kidney disease: Secondary | ICD-10-CM | POA: Diagnosis not present

## 2020-12-05 DIAGNOSIS — E46 Unspecified protein-calorie malnutrition: Secondary | ICD-10-CM

## 2020-12-05 DIAGNOSIS — Z7952 Long term (current) use of systemic steroids: Secondary | ICD-10-CM | POA: Diagnosis not present

## 2020-12-05 DIAGNOSIS — N179 Acute kidney failure, unspecified: Secondary | ICD-10-CM | POA: Diagnosis not present

## 2020-12-05 DIAGNOSIS — Z888 Allergy status to other drugs, medicaments and biological substances status: Secondary | ICD-10-CM | POA: Diagnosis not present

## 2020-12-05 DIAGNOSIS — K912 Postsurgical malabsorption, not elsewhere classified: Secondary | ICD-10-CM | POA: Diagnosis not present

## 2020-12-05 DIAGNOSIS — E538 Deficiency of other specified B group vitamins: Secondary | ICD-10-CM | POA: Diagnosis not present

## 2020-12-05 DIAGNOSIS — F32A Depression, unspecified: Secondary | ICD-10-CM | POA: Diagnosis present

## 2020-12-05 DIAGNOSIS — Z79899 Other long term (current) drug therapy: Secondary | ICD-10-CM

## 2020-12-05 DIAGNOSIS — U071 COVID-19: Secondary | ICD-10-CM | POA: Diagnosis not present

## 2020-12-05 DIAGNOSIS — K529 Noninfective gastroenteritis and colitis, unspecified: Secondary | ICD-10-CM | POA: Diagnosis present

## 2020-12-05 DIAGNOSIS — Z765 Malingerer [conscious simulation]: Secondary | ICD-10-CM | POA: Diagnosis not present

## 2020-12-05 DIAGNOSIS — Z789 Other specified health status: Secondary | ICD-10-CM | POA: Diagnosis present

## 2020-12-05 DIAGNOSIS — N1831 Chronic kidney disease, stage 3a: Secondary | ICD-10-CM | POA: Diagnosis present

## 2020-12-05 DIAGNOSIS — Z6821 Body mass index (BMI) 21.0-21.9, adult: Secondary | ICD-10-CM

## 2020-12-05 DIAGNOSIS — I10 Essential (primary) hypertension: Secondary | ICD-10-CM | POA: Diagnosis present

## 2020-12-05 DIAGNOSIS — Z83511 Family history of glaucoma: Secondary | ICD-10-CM

## 2020-12-05 DIAGNOSIS — K5080 Crohn's disease of both small and large intestine without complications: Secondary | ICD-10-CM | POA: Diagnosis present

## 2020-12-05 DIAGNOSIS — Z87891 Personal history of nicotine dependence: Secondary | ICD-10-CM | POA: Diagnosis not present

## 2020-12-05 DIAGNOSIS — Z8249 Family history of ischemic heart disease and other diseases of the circulatory system: Secondary | ICD-10-CM

## 2020-12-05 DIAGNOSIS — Z8619 Personal history of other infectious and parasitic diseases: Secondary | ICD-10-CM | POA: Diagnosis present

## 2020-12-05 DIAGNOSIS — N183 Chronic kidney disease, stage 3 unspecified: Secondary | ICD-10-CM

## 2020-12-05 DIAGNOSIS — K50818 Crohn's disease of both small and large intestine with other complication: Secondary | ICD-10-CM | POA: Diagnosis present

## 2020-12-05 LAB — URINALYSIS, ROUTINE W REFLEX MICROSCOPIC
Bacteria, UA: NONE SEEN
Bilirubin Urine: NEGATIVE
Glucose, UA: NEGATIVE mg/dL
Hgb urine dipstick: NEGATIVE
Ketones, ur: NEGATIVE mg/dL
Leukocytes,Ua: NEGATIVE
Nitrite: NEGATIVE
Protein, ur: 100 mg/dL — AB
Specific Gravity, Urine: 1.013 (ref 1.005–1.030)
pH: 6 (ref 5.0–8.0)

## 2020-12-05 NOTE — ED Provider Notes (Signed)
[facility] DEPT Provider Note   CSN: [id number] Arrival date & time: 12/05/20  2207     History Chief Complaint  Patient presents with   Fever    Caitlin Peters is a 60 y.o. female.  The history is provided by medical records and the patient.  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  Patient states she began running fevers yesterday, today she feels worse.  States she hurts all over but denies cough or SOB.  She has had nausea and decreased PO intake but no vomiting/diarrhea.  Denies urinary symptoms.  She has been exposed to family members with covid-19.  She has been fully vaccinated w/booster.  Patient with frequent PICC line infections/bacteremia (some felt to be due to manipulation for intended hospitalization), most recent was 11/06/20 to 11/10/20.  Blood cultures at that time grew out acinetobacter ursingii.  She is followed by infectious disease, Dr. Tommy Medal.  States she has been having issues with one of the ports of her PICC line and is due to have that "fixed" tomorrow.  Past Medical History:  Diagnosis Date   Acute pancreatitis 04/13/2020   Anasarca 10/2019   AVN (avascular necrosis of bone) ([name])    Cataract    Choledocholithiasis (sludge) s/p ERCP 10/2019 10/21/2020   Chronic pain syndrome    CKD (chronic kidney disease), stage III ([name])    Crohn disease ([place])    Crohn's disease of small & large intestine with [place]   Caitlin Peters is a 60year old female with a history of Crohn's disease diagnosed in 75 (age 26), history of long-term steroid use with osteoporosis, S/P multiple bowel resections ([id number]) complicated by chronic back and abdominal pain,  steatorrhea and short bowel syndrome. The patient has been left with ~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 attached to proximal transverse colon through rectum. 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; 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; jejunal to transverse colon anastomosis Treated at [facility] SB lengthening to 165cm Dr Alene Mires, [facility]   Vitamin B12 deficiency     Patient Active Problem List   Diagnosis Date Noted   Palpitations 11/22/2020   PAC (premature atrial contraction) 11/22/2020   History of central line-associated bloodstream infection (CLABSI) 10/21/2020   Nausea & vomiting 10/21/2020   Choledocholithiasis (sludge) s/p ERCP 10/2019 10/21/2020   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   Abdominal pain 09/73/5329   Acute metabolic encephalopathy    Hypomagnesemia    Partial small bowel obstruction ([place])    Bacteremia associated with intravascular line ([place]) 08/04/2020   Enterobacter sepsis ([place]) 07/22/2020   Anxiety 06/03/2020   Acute pancreatitis 04/13/2020   Infection due to Acinetobacter species 03/10/2020   Pancytopenia ([place]) 03/05/2020   Central line infection    Elevated liver enzymes 01/02/2020   Cholangitis    Anasarca 10/28/2019   Acute kidney injury superimposed on  chronic kidney disease ([place]) 10/28/2019   Falls 10/28/2019   Malnutrition ([place])    Vitamin B12 deficiency    GERD (gastroesophageal reflux disease)    Chronic pain syndrome    Hypokalemia due to excessive gastrointestinal loss of potassium 10/13/2019   Fever    Hypokalemia 10/12/2019   Chronic diarrhea 10/12/2019   Dysuria 10/12/2019   Bilateral  lower extremity edema 10/12/2019   AKI (acute kidney injury) ([place]) 10/12/2019   Fungemia 08/27/2019   IDA (iron deficiency anemia) 11/03/2018   Dilation of biliary tract 08/28/2018   Severe diarrhea 03/07/2018   LFTs abnormal 01/27/2018   GI tract obstruction ([place]) 01/27/2018   Gram-negative bacteremia 09/13/2017   HTN (hypertension), benign 12/02/2016   Intractable pain 12/02/2016   Anemia 12/02/2016   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   Avascular necrosis ([place]) 06/14/2015   Polyarthralgia 05/03/2015   On total parenteral nutrition (TPN) 12/30/2014   Osteoporosis 12/24/2014   Low back pain 12/16/2014   Vitamin D deficiency 12/16/2014   Short gut syndrome 07/15/2014   History of colonic diverticulitis 2014   Depression 07/24/2012   Gram-positive bacteremia 07/24/2012   Small bowel obstruction due to adhesions ([place]) 07/24/2012   Diarrhea 12/13/2011   Neuralgia and neuritis 06/01/2011   Myalgia and myositis 08/12/2003   Crohn's disease of small & large intestine with SGS 1984    Past Surgical History:  Procedure Laterality Date   ABDOMINAL ADHESION SURGERY  01/22/2018   APPENDECTOMY  1989   BILIARY DILATION  11/26/2019   Procedure: BILIARY DILATION;  Surgeon: Jackquline Denmark, MD;  Location: [facility];  Service: Endoscopy;;   BILIARY DILATION  03/08/2020   Procedure: BILIARY DILATION;  Surgeon: Irving Copas., MD;  Location: [facility];  Service: Gastroenterology;;   BIOPSY  03/08/2020   Procedure: BIOPSY;  Surgeon: Irving Copas., MD;  Location[facility] END[facility]COPY;  Service: Gastroenterology;;   CHEST WALL RESECTION     right thoracotomy,resection of chest mass with anterior rib and reconstruction using prosthetic mesh and video arthroscopy   CHOLECYSTECTOMY  01/22/2018   COLONOSCOPY  2019   ENTEROSTOMY CLOSURE  04/1999   ERCP N/A 11/26/2019   Procedure: ENDOSCOPIC RETROGRADE CHOLANGIOPANCREATOGRAPHY (ERCP);  Surgeon: Jackquline Denmark, MD;  Location: [facility];  Service: Endoscopy;  Laterality: N/A;   ERCP N/A 03/08/2020   Procedure: ENDOSCOPIC RETROGRADE CHOLANGIOPANCREATOGRAPHY (ERCP);  Surgeon: Irving Copas., MD;  Location: [facility];  Service: Gastroenterology;  Laterality: N/A;   ESOPHAGOGASTRODUODENOSCOPY N/A 03/08/2020   Procedure: ESOPHAGOGASTRODUODENOSCOPY (EGD);  Surgeon: Irving Copas., MD;  Location: [facility];  Service: Gastroenterology;  Laterality: N/A;   EUS N/A 03/08/2020   Procedure: UPPER ENDOSCOPIC ULTRASOUND (EUS) LINEAR;  Surgeon: Irving Copas., MD;  Location: [facility];  Service: Gastroenterology;  Laterality: N/A;   ILEOCECETOMY  03/1999   ileocolon resection with abdominal stoma   ILEOSTOMY CLOSURE  2001   IR FLUORO GUIDE CV LINE LEFT  01/07/2020   IR FLUORO GUIDE CV LINE LEFT  03/09/2020   IR FLUORO GUIDE CV LINE LEFT  05/09/2020   IR FLUORO GUIDE CV LINE LEFT  07/20/2020   IR FLUORO GUIDE CV LINE RIGHT  08/05/2020   IR PTA VENOUS EXCEPT DIALYSIS CIRCUIT  01/07/2020   IR REMOVAL TUN CV CATH W/O FL  08/05/2020   IR US GUIDE VASC ACCESS LEFT     x 2 06/17/19 and 09/14/2019   IR US GUIDE VASC ACCESS RIGHT  08/05/2020   KNEE SURGERY     right knee  LAPAROSCOPIC SMALL BOWEL RESECTION  2009   2000-2009.  SB resections for Crohns Disease - now with Short gut   OMENTECTOMY  01/22/2018   PARTIAL HYSTERECTOMY  1984   with LSO   REMOVAL OF STONES  11/26/2019   Procedure: REMOVAL OF STONES;  Surgeon: Jackquline Denmark, MD;  Location: [facility];  Service: Endoscopy;;   REMOVAL OF STONES  03/08/2020   Procedure: REMOVAL OF STONES;  Surgeon: Irving Copas., MD;  Location: [facility] ENDOSCOPY;  Service: Gastroenterology;;   SALPINGOOPHORECTOMY Left 1984   SALPINGOOPHORECTOMY Right 1990   SERIAL TRANSVERSE ENTEROPLASTY (STEP) - SMALL BOWEL LENGTHENING  01/22/2018   Dr Alene Mires, [facility] - SB length from 120 to 165cm    SMALL INTESTINE  SURGERY  2002   SMALL INTESTINE SURGERY  2003   SPHINCTEROTOMY  11/26/2019   Procedure: SPHINCTEROTOMY;  Surgeon: Jackquline Denmark, MD;  Location: [facility] ENDOS[facility];  Service: Endoscopy;;   TOTAL ABDOMINAL HYSTERECTOMY  1990   with RSO   UPPER GASTROINTESTINAL ENDOSCOPY       OB History   No obstetric history on file.     Family History  Problem Relation Age of Onset   Breast cancer Sister    Multiple sclerosis Sister    Diabetes Sister    Lupus Sister    Colon cancer Other    Crohn's disease Other    Seizures Mother    Glaucoma Mother    CAD Father    Heart disease Father    Hypertension Father     Social History   Tobacco Use   Smoking status: Former Smoker   Smokeless tobacco: Never Used  Scientific laboratory technician Use: Never used  Substance Use Topics   Alcohol use: Not Currently   Drug use: Never    Home Medications Prior to Admission medications   Medication Sig Start Date End Date Taking? Authorizing Provider  acetaminophen (TYLENOL) 325 MG tablet Take 650 mg by mouth every 6 (six) hours as needed for mild pain.     [provider]  amLODipine (NORVASC) 10 MG tablet Take 1 tablet (10 mg total) by mouth daily. 06/23/20 10/21/20  [name], [name], MD  budesonide (ENTOCORT EC) 3 MG 24 hr capsule TAKE 3 CAPSULES BY MOUTH ONCE DAILY 04/05/20   [name], [name], MD  buPROPion [facility] SR) 100 MG 12 hr tablet Take 2 tablets by mouth once daily 07/04/20   [name], [name], MD  Calcium 200 MG TABS Take by mouth.    [provider]  cholecalciferol (VITAMIN D3) 25 MCG (1000 UT) tablet Take 1,000 Units by mouth daily.     [provider]  cycloSPORINE (RESTASIS) 0.05 % ophthalmic emulsion Place 1 drop into both eyes 2 (two) times daily.    [provider]  dexlansoprazole (DEXILANT) 60 MG capsule Take 1 capsule (60 mg total) by mouth daily. 06/28/20   [name], [name], MD  Dextran  70-Hypromellose 0.1-0.3 % SOLN Place 1 drop into both eyes 4 (four) times daily.    [provider]  diphenoxylate-atropine (LOMOTIL) 2.5-0.025 MG tablet TAKE 1 TABLET BY MOUTH 4 TIMES DAILY AS NEEDED FOR DIARRHEA OR  LOOSE  STOOLS 11/23/20   [name], [name], MD  DULoxetine (CYMBALTA) 30 MG capsule Take 3 capsules (90 mg total) by mouth daily. 09/13/20   [name], [name], MD  estradiol (ESTRACE) 2 MG tablet Take 1 tablet (2 mg total) by mouth daily. 06/28/20  [name], [name], MD  hydrALAZINE (APRESOLINE) 50 MG tablet Take 1.5 tablets (75 mg total) by mouth 3 (three) times daily. 11/22/20 02/20/21  Rudean Haskell A, MD  HYDROmorphone (DILAUDID) 4 MG tablet Take 4 mg by mouth 4 (four) times daily as needed for moderate pain.  09/15/20   [provider]  lipase/protease/amylase (CREON) 36000 UNITS CPEP capsule Take 1 capsule (36,000 Units total) by mouth 3 (three) times daily as needed (with meals for digestion). 06/28/20   [name], [name], MD  lipase/protease/amylase (CREON) 725 136 6528 UNITS CPEP capsule Take by mouth.    [provider]  loperamide (IMODIUM) 2 MG capsule Take 2 mg by mouth as needed for diarrhea or loose stools.    [provider]  methadone (DOLOPHINE) 5 MG tablet Take 1 tablet (5 mg total) by mouth 5 (five) times daily. 06/30/20   Florencia Reasons, MD  metoprolol tartrate (LOPRESSOR) 100 MG tablet Take 1 tablet (100 mg total) by mouth 2 (two) times daily. 06/28/20   [name], [name], MD  Multiple Vitamins-Minerals (MULTIVITAMIN ADULT PO) Take 1 tablet by mouth daily.    [provider]  NARCAN 4 MG/0.1ML LIQD nasal spray kit Place 1 spray into the nose once.  10/14/20   [provider]  PRESCRIPTION MEDICATION Inject 1 each into the vein daily. Home TPN . [facility] . 1 bag for 12 hours. [phone]    [provider]  Probiotic Product (PROBIOTIC-10 PO)  Take 1 capsule by mouth daily.     [provider]  promethazine (PHENERGAN) 25 MG tablet TAKE 1 TABLET BY MOUTH EVERY 6 HOURS AS NEEDED FOR NAUSEA AND VOMITING 11/11/20   [name], [name], MD  sodium chloride 0.9 % infusion Inject into the vein. 10/16/20   [provider]  sucralfate (CARAFATE) 1 GM/10ML suspension Take 10 mLs (1 g total) by mouth 4 (four) times daily -  with meals and at bedtime. 06/28/20   [name], [name], MD  Trace Minerals Cu-Mn-Se-Zn (TRALEMENT IV) Inject 1 mL into the vein. Used in hydration bag 4 times weekly    [provider]  traZODone (DESYREL) 100 MG tablet Take 2 tablets (200 mg total) by mouth at bedtime as needed for sleep. 09/13/20   [name], [name], MD  vitamin B-12 (CYANOCOBALAMIN) 100 MCG tablet Take 1,000 mcg by mouth daily.     [provider]    Allergies    Cefepime, Gabapentin, Hyoscyamine, Lyrica [pregabalin], Meperidine, Topamax [topiramate], Zosyn [piperacillin sod-tazobactam so], Fentanyl, and Morphine and related  Review of Systems   Review of Systems  Constitutional: Positive for fever.  Gastrointestinal: Positive for nausea.  Musculoskeletal: Positive for myalgias.  All other systems reviewed and are negative.   Physical Exam Updated Vital Signs BP (!) 158/73 (BP Location: Left Arm)    Pulse 84    Temp (!) 103.2 F (39.6 C) (Oral)    Resp (!) 22    SpO2 96%   Physical Exam Vitals and nursing note reviewed.  Constitutional:      Appearance: She is well-developed and well-nourished.     Comments: Warm to the touch  HENT:     Head: Normocephalic and atraumatic.     Mouth/Throat:     Mouth: Oropharynx is clear and moist.  Eyes:     Extraocular Movements: EOM normal.     Conjunctiva/sclera: Conjunctivae normal.     Pupils: Pupils are equal,  round, and reactive to light.  Cardiovascular:     Rate and Rhythm: Regular rhythm. Tachycardia present.     Heart sounds: Normal  heart sounds.     Comments: Low grade tachy around 100 in room Pulmonary:     Effort: Pulmonary effort is normal. No respiratory distress.     Breath sounds: Normal breath sounds. No wheezing or rhonchi.     Comments: Lungs grossly clear, no cough observed, able to speak in full sentences without difficulty Abdominal:     General: Bowel sounds are normal.     Palpations: Abdomen is soft.     Tenderness: There is no abdominal tenderness. There is no rebound.     Comments: Midline abdominal scar noted No bloating or distention  Musculoskeletal:        General: Normal range of motion.     Cervical back: Normal range of motion.     Comments: PICC line right femoral, dressing appears clean, no bleeding  Skin:    General: Skin is warm and dry.  Neurological:     Mental Status: She is alert and oriented to person, place, and time.  Psychiatric:        Mood and Affect: Mood and affect normal.     ED Results / Procedures / Treatments   Labs (all labs ordered are listed, but only abnormal results are displayed) Labs Reviewed  RESP PANEL BY RT-PCR (FLU A&B, COVID) ARPGX2 - Abnormal; Notable for the following components:      Result Value   SARS Coronavirus 2 by RT PCR POSITIVE (*)    All other components within normal limits  CBC WITH DIFFERENTIAL/PLATELET - Abnormal; Notable for the following components:   WBC 2.9 (*)    RBC 3.02 (*)    Hemoglobin 9.2 (*)    HCT 27.7 (*)    Platelets 139 (*)    Lymphs Abs 0.6 (*)    All other components within normal limits  COMPREHENSIVE METABOLIC PANEL - Abnormal; Notable for the following components:   Glucose, Bld 131 (*)    BUN 27 (*)    Creatinine, Ser 1.30 (*)    Calcium 8.8 (*)    Albumin 3.4 (*)    Alkaline Phosphatase 163 (*)    GFR, Estimated 47 (*)    All other components within normal limits  URINALYSIS, ROUTINE W REFLEX MICROSCOPIC - Abnormal; Notable for the following components:   Protein, ur 100 (*)    All other components  within normal limits  CULTURE, BLOOD (ROUTINE X 2)  CULTURE, BLOOD (ROUTINE X 2)  URINE CULTURE  LACTIC ACID, PLASMA  CBC  CREATININE, SERUM    EKG EKG Interpretation  Date/Time:  Monday December 05 2020 22:50:32 EST Ventricular Rate:  86 PR Interval:    QRS Duration: 79 QT Interval:  378 QTC Calculation: 453 R Axis:   74 Text Interpretation: Sinus rhythm Abnormal R-wave progression, early transition Consider left ventricular hypertrophy No acute changes No significant change since last tracing Confirmed by Varney Biles ([id number]) on 12/06/2020 12:30:40 AM   Radiology DG Chest Port 1 View  Result Date: 12/05/2020 CLINICAL DATA:  Fever EXAM: PORTABLE CHEST 1 VIEW COMPARISON:  11/07/2020 FINDINGS: Mild cardiomegaly. No confluent opacities or effusions. No acute bony abnormality. IMPRESSION: No active disease. Electronically Signed   By: Rolm Baptise M.D.   On: 12/05/2020 23:12    Procedures Procedures (including critical care time)  Medications Ordered in ED Medications - No data to display  ED Course  I have reviewed the triage vital signs and the nursing notes.  Pertinent labs & imaging results that were available during my care of the patient were reviewed by me and considered in my medical decision making (see chart for details).    MDM Rules/Calculators/A&P  60 year old female presenting to the ED with fever that began yesterday.  States today she feels worse with diffuse bodily pain.  She has chronic abdominal pain from her Crohn's.  History is complicated by recurrent bacteremia secondary to chronic PICC line for TPN.  Currently line is in right femoral, overall appears clean without any signs of infection on exam today.  Per chart history, there is some concern that she may be manipulating this to cause infection and subsequent hospitalization.  Nonetheless, she is febrile to 103F here, warm to the touch but overall non-toxic.  She actually has no respiratory complaints  but has had covid exposures from family.  She is fully vaccinated with booster.  Will obtain labs, lactate, blood cultures, CXR, covid screen.    Labs as above-- noted leukopenia and thrombocytopenia.  Chronic anemia.  CXR without acute findings.  Covid test is positive.  She remains without cough or respiratory complaints but has been somewhat tachypneic here.  Not requiring O2. Her fever may very well be due to her covid-19, however given her history of recurrent bacteremia with indwelling PICC line, I feel it would be safer to admit here here pending blood cultures.  Does report issues with one of the ports, was due to have that addressed tomorrow.  1:43 AM Discussed with hospitalist, Dr. Tonie Griffith-- will start decadron, remdesivir, admit as obs and hold on abx until blood cultures return.  Final Clinical Impression(s) / ED Diagnoses Final diagnoses:  COVID-19  Fever, unspecified fever cause    Rx / DC Orders ED Discharge Orders    None       Larene Pickett, PA-C 12/06/20 [id number]    Varney Biles, MD 12/06/20 1218

## 2020-12-05 NOTE — ED Notes (Signed)
Pt able to get to bedside commode without assistance.

## 2020-12-05 NOTE — ED Triage Notes (Signed)
Pt arrives EMS with c/o fever ranging 100.6-103. Pt c/o chronic abdomen pain. Family members in household covid positive.    1040m Tylenol 474mzofran IM

## 2020-12-06 ENCOUNTER — Inpatient Hospital Stay (HOSPITAL_COMMUNITY): Admission: RE | Admit: 2020-12-06 | Payer: Medicare Other | Source: Ambulatory Visit

## 2020-12-06 ENCOUNTER — Encounter (HOSPITAL_COMMUNITY): Payer: Self-pay | Admitting: Family Medicine

## 2020-12-06 DIAGNOSIS — U071 COVID-19: Principal | ICD-10-CM

## 2020-12-06 DIAGNOSIS — E46 Unspecified protein-calorie malnutrition: Secondary | ICD-10-CM

## 2020-12-06 DIAGNOSIS — N183 Chronic kidney disease, stage 3 unspecified: Secondary | ICD-10-CM

## 2020-12-06 DIAGNOSIS — N184 Chronic kidney disease, stage 4 (severe): Secondary | ICD-10-CM

## 2020-12-06 LAB — CBC
HCT: 26.4 % — ABNORMAL LOW (ref 36.0–46.0)
Hemoglobin: 8.8 g/dL — ABNORMAL LOW (ref 12.0–15.0)
MCH: 30.7 pg (ref 26.0–34.0)
MCHC: 33.3 g/dL (ref 30.0–36.0)
MCV: 92 fL (ref 80.0–100.0)
Platelets: 128 10*3/uL — ABNORMAL LOW (ref 150–400)
RBC: 2.87 MIL/uL — ABNORMAL LOW (ref 3.87–5.11)
RDW: 14.6 % (ref 11.5–15.5)
WBC: 2.1 10*3/uL — ABNORMAL LOW (ref 4.0–10.5)
nRBC: 0 % (ref 0.0–0.2)

## 2020-12-06 LAB — RESP PANEL BY RT-PCR (FLU A&B, COVID) ARPGX2
Influenza A by PCR: NEGATIVE
Influenza B by PCR: NEGATIVE
SARS Coronavirus 2 by RT PCR: POSITIVE — AB

## 2020-12-06 LAB — CBC WITH DIFFERENTIAL/PLATELET
Abs Immature Granulocytes: 0 10*3/uL (ref 0.00–0.07)
Basophils Absolute: 0 10*3/uL (ref 0.0–0.1)
Basophils Relative: 0 %
Eosinophils Absolute: 0 10*3/uL (ref 0.0–0.5)
Eosinophils Relative: 0 %
HCT: 27.7 % — ABNORMAL LOW (ref 36.0–46.0)
Hemoglobin: 9.2 g/dL — ABNORMAL LOW (ref 12.0–15.0)
Immature Granulocytes: 0 %
Lymphocytes Relative: 22 %
Lymphs Abs: 0.6 10*3/uL — ABNORMAL LOW (ref 0.7–4.0)
MCH: 30.5 pg (ref 26.0–34.0)
MCHC: 33.2 g/dL (ref 30.0–36.0)
MCV: 91.7 fL (ref 80.0–100.0)
Monocytes Absolute: 0.4 10*3/uL (ref 0.1–1.0)
Monocytes Relative: 13 %
Neutro Abs: 1.9 10*3/uL (ref 1.7–7.7)
Neutrophils Relative %: 65 %
Platelets: 139 10*3/uL — ABNORMAL LOW (ref 150–400)
RBC: 3.02 MIL/uL — ABNORMAL LOW (ref 3.87–5.11)
RDW: 14.6 % (ref 11.5–15.5)
WBC: 2.9 10*3/uL — ABNORMAL LOW (ref 4.0–10.5)
nRBC: 0 % (ref 0.0–0.2)

## 2020-12-06 LAB — COMPREHENSIVE METABOLIC PANEL
ALT: 32 U/L (ref 0–44)
AST: 32 U/L (ref 15–41)
Albumin: 3.4 g/dL — ABNORMAL LOW (ref 3.5–5.0)
Alkaline Phosphatase: 163 U/L — ABNORMAL HIGH (ref 38–126)
Anion gap: 9 (ref 5–15)
BUN: 27 mg/dL — ABNORMAL HIGH (ref 6–20)
CO2: 24 mmol/L (ref 22–32)
Calcium: 8.8 mg/dL — ABNORMAL LOW (ref 8.9–10.3)
Chloride: 103 mmol/L (ref 98–111)
Creatinine, Ser: 1.3 mg/dL — ABNORMAL HIGH (ref 0.44–1.00)
GFR, Estimated: 47 mL/min — ABNORMAL LOW (ref >60)
Glucose, Bld: 131 mg/dL — ABNORMAL HIGH (ref 70–99)
Potassium: 4 mmol/L (ref 3.5–5.1)
Sodium: 136 mmol/L (ref 135–145)
Total Bilirubin: 0.6 mg/dL (ref 0.3–1.2)
Total Protein: 7.1 g/dL (ref 6.5–8.1)

## 2020-12-06 LAB — LACTIC ACID, PLASMA: Lactic Acid, Venous: 0.5 mmol/L (ref 0.5–1.9)

## 2020-12-06 LAB — CREATININE, SERUM
Creatinine, Ser: 1.29 mg/dL — ABNORMAL HIGH (ref 0.44–1.00)
GFR, Estimated: 48 mL/min — ABNORMAL LOW (ref >60)

## 2020-12-06 MED ORDER — HYDRALAZINE HCL 25 MG PO TABS
25.0000 mg | ORAL_TABLET | Freq: Four times a day (QID) | ORAL | Status: DC | PRN
  Administered 2020-12-06: 25 mg via ORAL
  Filled 2020-12-06: qty 1

## 2020-12-06 MED ORDER — PROMETHAZINE HCL 25 MG PO TABS
25.0000 mg | ORAL_TABLET | Freq: Once | ORAL | Status: AC
  Administered 2020-12-06: 25 mg via ORAL
  Filled 2020-12-06: qty 1

## 2020-12-06 MED ORDER — GUAIFENESIN-DM 100-10 MG/5ML PO SYRP: 10.0000 mL | ORAL_SOLUTION | ORAL | Status: DC | PRN

## 2020-12-06 MED ORDER — HYDROMORPHONE HCL 1 MG/ML IJ SOLN
1.0000 mg | INTRAMUSCULAR | Status: DC | PRN
Start: 2020-12-06 — End: 2020-12-07
  Administered 2020-12-06 – 2020-12-07 (×7): 1 mg via INTRAVENOUS
  Filled 2020-12-06 (×7): qty 1

## 2020-12-06 MED ORDER — PROMETHAZINE HCL 25 MG/ML IJ SOLN
12.5000 mg | Freq: Once | INTRAMUSCULAR | Status: AC
  Administered 2020-12-06: 12.5 mg via INTRAVENOUS
  Filled 2020-12-06: qty 1

## 2020-12-06 MED ORDER — SODIUM CHLORIDE 0.9 % IV SOLN
200.0000 mg | Freq: Once | INTRAVENOUS | Status: AC
  Administered 2020-12-06: 200 mg via INTRAVENOUS
  Filled 2020-12-06: qty 200

## 2020-12-06 MED ORDER — ACETAMINOPHEN 650 MG RE SUPP: 650.0000 mg | Freq: Four times a day (QID) | RECTAL | Status: DC | PRN

## 2020-12-06 MED ORDER — HYDROMORPHONE HCL 2 MG PO TABS
4.0000 mg | ORAL_TABLET | Freq: Once | ORAL | Status: AC
  Administered 2020-12-06: 4 mg via ORAL
  Filled 2020-12-06: qty 2

## 2020-12-06 MED ORDER — PROPOFOL 10 MG/ML IV BOLUS
INTRAVENOUS | Status: AC
  Filled 2020-12-06: qty 20

## 2020-12-06 MED ORDER — METOPROLOL TARTRATE 5 MG/5ML IV SOLN
5.0000 mg | Freq: Three times a day (TID) | INTRAVENOUS | Status: DC
  Administered 2020-12-06 – 2020-12-08 (×5): 5 mg via INTRAVENOUS
  Filled 2020-12-06 (×5): qty 5

## 2020-12-06 MED ORDER — PROMETHAZINE HCL 25 MG/ML IJ SOLN
12.5000 mg | Freq: Four times a day (QID) | INTRAMUSCULAR | Status: DC | PRN
  Administered 2020-12-06 – 2020-12-08 (×6): 12.5 mg via INTRAVENOUS
  Filled 2020-12-06 (×6): qty 1

## 2020-12-06 MED ORDER — HYDROMORPHONE HCL 1 MG/ML IJ SOLN
1.0000 mg | Freq: Once | INTRAMUSCULAR | Status: AC
  Administered 2020-12-06: 1 mg via INTRAVENOUS
  Filled 2020-12-06: qty 1

## 2020-12-06 MED ORDER — ALBUTEROL SULFATE HFA 108 (90 BASE) MCG/ACT IN AERS: 2.0000 | INHALATION_SPRAY | RESPIRATORY_TRACT | Status: DC | PRN

## 2020-12-06 MED ORDER — ENOXAPARIN SODIUM 40 MG/0.4ML ~~LOC~~ SOLN
40.0000 mg | SUBCUTANEOUS | Status: DC
  Administered 2020-12-06 – 2020-12-08 (×3): 40 mg via SUBCUTANEOUS
  Filled 2020-12-06 (×4): qty 0.4

## 2020-12-06 MED ORDER — HYDRALAZINE HCL 20 MG/ML IJ SOLN: 10.0000 mg | INTRAMUSCULAR | Status: DC | PRN

## 2020-12-06 MED ORDER — SODIUM CHLORIDE 0.9 % IV SOLN
100.0000 mg | Freq: Every day | INTRAVENOUS | Status: AC
  Administered 2020-12-07 – 2020-12-08 (×2): 100 mg via INTRAVENOUS
  Filled 2020-12-06 (×2): qty 20

## 2020-12-06 MED ORDER — DEXAMETHASONE SODIUM PHOSPHATE 10 MG/ML IJ SOLN
10.0000 mg | Freq: Once | INTRAMUSCULAR | Status: AC
  Administered 2020-12-06: 10 mg via INTRAVENOUS
  Filled 2020-12-06: qty 1

## 2020-12-06 MED ORDER — LACTATED RINGERS IV SOLN: INTRAVENOUS | Status: DC

## 2020-12-06 NOTE — ED Notes (Signed)
Pt c/o sob and family members being COVID POS. Pt also C?o chronic ABD . Pain

## 2020-12-06 NOTE — ED Notes (Signed)
Pt in bed watching tv

## 2020-12-06 NOTE — ED Notes (Signed)
Vital signs stable. 

## 2020-12-06 NOTE — ED Notes (Signed)
Date and time results received: 12/06/20 0052 (use smartphrase ".now" to insert current time)  Test: covid Critical Value: POS  Name of Provider Notified: sanders   Orders Received? Or Actions Taken?:see orders

## 2020-12-06 NOTE — H&P (Signed)
History and Physical    Caitlin Peters BJS:[id number] DOB: 1961/03/10 DOA: 12/05/2020  PCP: [name], [name], MD   Patient coming from: Home  Chief Complaint:  Fever, bodyaches  HPI: Caitlin Peters is a 60 y.o. 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  Patient states she began running fevers since yesterday and today had bodyaches and felt worse.  States she hurts all over but denies cough or SOB.  She has had nausea and decreased PO intake but no vomiting. She has chronic diarrhea she states due to Crohn's disease. She states she does not eat due to Crohns disease and chronic pain. She has a PICC line and has TPN daily.  She has been admitted multiple times over the last year for bacteremia secondary to infected PICC line.  PICC line was changed a few weeks ago.  She finished course of antibiotic with meropenem 10 days ago.  She is afraid to go home at this point because of her history of bacteremia and feels she positive blood cultures again today.  She denies urinary symptoms.  She has been exposed to family members with covid-19.  She has been fully vaccinated w/booster.  Review of Systems:  General: Denies weakness, night sweats.  Denies dizziness.  HENT: Denies head trauma, headache, denies change in hearing, tinnitus.  Denies nasal congestion or bleeding.  Denies sore throat, sores in mouth.  Eyes: Denies blurry vision, pain in eye, drainage.  Denies discoloration of eyes. Neck: Denies pain.  Denies swelling.  Denies pain with movement. Cardiovascular: Denies chest pain, palpitations.  Denies edema.  Denies orthopnea Respiratory: Denies shortness of breath, cough.  Denies wheezing.  Denies sputum production Gastrointestinal: Has chronic abdominal pain.  Reports nausea but no vomiting.  Denies melena.  Denies hematemesis. Musculoskeletal: Denies limitation of movement.  Denies deformity or swelling.  Has chronic pain Genitourinary: Denies pelvic pain.  Denies urinary frequency or hesitancy.  Denies dysuria.  Skin: Denies rash.  Denies petechiae, purpura, ecchymosis. Neurological:  Denies syncope.  Denies seizure activity.  Denies paresthesia.  Denies slurred speech, drooping face.  Denies visual change. Psychiatric: Denies depression, anxiety. Denies hallucinations.  Past Medical History:  Diagnosis Date  . Acute pancreatitis 04/13/2020  . Anasarca 10/2019  . AVN (avascular necrosis of bone) ([place])   . Cataract   . Choledocholithiasis (sludge) s/p ERCP 10/2019 10/21/2020  . Chronic pain syndrome   . CKD (chronic kidney disease), stage III ([place])   . Crohn disease ([place])   . Crohn's disease of small & large intestine with SGS 1984   Caitlin Peters is a 60year old female with a history of Crohn's disease diagnosed in 24 (age 76), history of long-term steroid use with osteoporosis, S/P multiple bowel resections ([id number]) complicated by chronic back and abdominal pain, steatorrhea and short bowel syndrome. The patient has been left with ~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 attached to proximal transverse colon through rectum. She has been  . Depression   . Diverticulosis   . GERD (gastroesophageal reflux disease)   . HTN (hypertension)   . IDA (iron deficiency anemia)   . Malnutrition ([place])   . Mass in chest   . Osteoporosis   . Pancreatitis   . 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  ESTABLISHED AT [facility]  . Vitamin B12 deficiency     Past Surgical History:  Procedure Laterality Date  . ABDOMINAL ADHESION SURGERY  01/22/2018   . APPENDECTOMY  1989  . BILIARY DILATION  11/26/2019   Procedure: BILIARY DILATION;  Surgeon: Jackquline Denmark, MD;  Location: [facility];  Service: Endoscopy;;  . BILIARY DILATION  03/08/2020   Procedure: BILIARY DILATION;  Surgeon: Irving Copas., MD;  Location: [place];  Service: Gastroenterology;;  . BIOPSY  03/08/2020   Procedure: BIOPSY;  Surgeon: Irving Copas., MD;  Location: [place];  Service: Gastroenterology;;  . CHEST WALL RESECTION     right thoracotomy,resection of chest mass with anterior rib and reconstruction using prosthetic mesh and video arthroscopy  . CHOLECYSTECTOMY  01/22/2018  . COLONOSCOPY  2019  . ENTEROSTOMY CLOSURE  04/1999  . ERCP N/A 11/26/2019   Procedure: ENDOSCOPIC RETROGRADE CHOLANGIOPANCREATOGRAPHY (ERCP);  Surgeon: Jackquline Denmark, MD;  Location: [facility];  Service: Endoscopy;  Laterality: N/A;  . ERCP N/A 03/08/2020   Procedure: ENDOSCOPIC RETROGRADE CHOLANGIOPANCREATOGRAPHY (ERCP);  Surgeon: Irving Copas., MD;  Location: [place];  Service: Gastroenterology;  Laterality: N/A;  . ESOPHAGOGASTRODUODENOSCOPY N/A 03/08/2020   Procedure: ESOPHAGOGASTRODUODENOSCOPY (EGD);  Surgeon: Irving Copas., MD;  Location: [place];  Service: Gastroenterology;  Laterality: N/A;  . EUS N/A 03/08/2020   Procedure: UPPER ENDOSCOPIC ULTRASOUND (EUS) LINEAR;  Surgeon: Irving Copas., MD;  Location: [place];  Service: Gastroenterology;  Laterality: N/A;  . ILEOCECETOMY  03/1999   ileocolon resection with abdominal stoma  . ILEOSTOMY CLOSURE  2001  . IR FLUORO GUIDE CV LINE LEFT  01/07/2020  . IR FLUORO GUIDE CV LINE LEFT  03/09/2020  . IR FLUORO GUIDE CV LINE LEFT  05/09/2020  . IR FLUORO GUIDE CV LINE LEFT  07/20/2020  . IR FLUORO GUIDE CV LINE RIGHT  08/05/2020  . IR PTA VENOUS EXCEPT DIALYSIS CIRCUIT  01/07/2020  . IR REMOVAL TUN CV CATH W/O FL  08/05/2020  . IR US GUIDE VASC ACCESS LEFT     x 2 06/17/19 and  09/14/2019  . IR US GUIDE VASC ACCESS RIGHT  08/05/2020  . KNEE SURGERY     right knee   . LAPAROSCOPIC SMALL BOWEL RESECTION  2009   2000-2009.  SB resections for Crohns Disease - now with Short gut  . OMENTECTOMY  01/22/2018  . PARTIAL HYSTERECTOMY  1984   with LSO  . REMOVAL OF STONES  11/26/2019   Procedure: REMOVAL OF STONES;  Surgeon: Jackquline Denmark, MD;  Location: [facility];  Service: Endoscopy;;  . REMOVAL OF STONES  03/08/2020   Procedure: REMOVAL OF STONES;  Surgeon: Irving Copas., MD;  Location: [place];  Service: Gastroenterology;;  . SALPINGOOPHORECTOMY Left 1984  . SALPINGOOPHORECTOMY Right 1990  . SERIAL TRANSVERSE ENTEROPLASTY (STEP) - SMALL BOWEL LENGTHENING  01/22/2018   Dr Alene Mires, [place] length from 120 to 165cm   . SMALL INTESTINE SURGERY  2002  . SMALL INTESTINE SURGERY  2003  . SPHINCTEROTOMY  11/26/2019   Procedure: SPHINCTEROTOMY;  Surgeon: Jackquline Denmark, MD;  Location: [facility];  Service: Endoscopy;;  . TOTAL ABDOMINAL HYSTERECTOMY  1990   with RSO  . UPPER GASTROINTESTINAL ENDOSCOPY      Social History  reports that she has quit smoking. She has never used smokeless  tobacco. She reports previous alcohol use. She reports that she does not use drugs.  Allergies  Allergen Reactions  . Cefepime Other (See Comments)    Neurotoxicity occurring in setting of AKI. Ceftriaxone tolerated during same admit  . Gabapentin Other (See Comments)    unknown  . Hyoscyamine Hives and Swelling    Legs swelling   Disorientation  . Lyrica [Pregabalin] Other (See Comments)    unknown  . Meperidine Hives    Other reaction(s): GI Upset Due to [place]   . Topamax [Topiramate] Other (See Comments)    unknown  . Zosyn [Piperacillin Sod-Tazobactam So]     Patient reports it makes her vomit, her neck stiff, and her "heart feel funny"  . Fentanyl Rash    Pt is allergic to fentanyl patch related to the glue (gives her a rash) Pt states  she is NOT allergic to fentanyl IV medicine  . Morphine And Related Rash    Family History  Problem Relation Age of Onset  . Breast cancer Sister   . Multiple sclerosis Sister   . Diabetes Sister   . Lupus Sister   . Colon cancer Other   . Crohn's disease Other   . Seizures Mother   . Glaucoma Mother   . CAD Father   . Heart disease Father   . Hypertension Father      Prior to Admission medications   Medication Sig Start Date End Date Taking? Authorizing Provider  acetaminophen (TYLENOL) 325 MG tablet Take 650 mg by mouth every 6 (six) hours as needed for mild pain.     [provider]  amLODipine (NORVASC) 10 MG tablet Take 1 tablet (10 mg total) by mouth daily. 06/23/20 10/21/20  [name], [name], MD  budesonide (ENTOCORT EC) 3 MG 24 hr capsule TAKE 3 CAPSULES BY MOUTH ONCE DAILY 04/05/20   [name], [name], MD  buPROPion [facility] SR) 100 MG 12 hr tablet Take 2 tablets by mouth once daily 07/04/20   [name], [name], MD  Calcium 200 MG TABS Take by mouth.    [provider]  cholecalciferol (VITAMIN D3) 25 MCG (1000 UT) tablet Take 1,000 Units by mouth daily.     [provider]  cycloSPORINE (RESTASIS) 0.05 % ophthalmic emulsion Place 1 drop into both eyes 2 (two) times daily.    [provider]  dexlansoprazole (DEXILANT) 60 MG capsule Take 1 capsule (60 mg total) by mouth daily. 06/28/20   [name], [name], MD  Dextran 70-Hypromellose 0.1-0.3 % SOLN Place 1 drop into both eyes 4 (four) times daily.    [provider]  diphenoxylate-atropine (LOMOTIL) 2.5-0.025 MG tablet TAKE 1 TABLET BY MOUTH 4 TIMES DAILY AS NEEDED FOR DIARRHEA OR  LOOSE  STOOLS 11/23/20   [name], [name], MD  DULoxetine (CYMBALTA) 30 MG capsule Take 3 capsules (90 mg total) by mouth daily. 09/13/20   [name], [name], MD  estradiol (ESTRACE) 2 MG tablet Take 1 tablet (2 mg total) by mouth daily. 06/28/20    [name], [name], MD  hydrALAZINE (APRESOLINE) 50 MG tablet Take 1.5 tablets (75 mg total) by mouth 3 (three) times daily. 11/22/20 02/20/21  Rudean Haskell A, MD  HYDROmorphone (DILAUDID) 4 MG tablet Take 4 mg by mouth 4 (four) times daily as needed for moderate pain.  09/15/20   [provider]  lipase/protease/amylase (CREON) 36000 UNITS CPEP capsule Take 1 capsule (36,000 Units total) by mouth 3 (  three) times daily as needed (with meals for digestion). 06/28/20   [name], [name], MD  lipase/protease/amylase (CREON) [phone] UNITS CPEP capsule Take by mouth.    [provider]  loperamide (IMODIUM) 2 MG capsule Take 2 mg by mouth as needed for diarrhea or loose stools.    [provider]  methadone (DOLOPHINE) 5 MG tablet Take 1 tablet (5 mg total) by mouth 5 (five) times daily. 06/30/20   Florencia Reasons, MD  metoprolol tartrate (LOPRESSOR) 100 MG tablet Take 1 tablet (100 mg total) by mouth 2 (two) times daily. 06/28/20   [name], [name], MD  Multiple Vitamins-Minerals (MULTIVITAMIN ADULT PO) Take 1 tablet by mouth daily.    [provider]  NARCAN 4 MG/0.1ML LIQD nasal spray kit Place 1 spray into the nose once.  10/14/20   [provider]  PRESCRIPTION MEDICATION Inject 1 each into the vein daily. Home TPN . [facility] in [facility] . 1 bag for 12 hours. [phone]    [provider]  Probiotic Product (PROBIOTIC-10 PO) Take 1 capsule by mouth daily.     [provider]  promethazine (PHENERGAN) 25 MG tablet TAKE 1 TABLET BY MOUTH EVERY 6 HOURS AS NEEDED FOR NAUSEA AND VOMITING 11/11/20   [name], [name], MD  sodium chloride 0.9 % infusion Inject into the vein. 10/16/20   [provider]  sucralfate (CARAFATE) 1 GM/10ML suspension Take 10 mLs (1 g total) by mouth 4 (four) times daily -  with meals and at bedtime. 06/28/20   [name], [name], MD  Trace Minerals  Cu-Mn-Se-Zn (TRALEMENT IV) Inject 1 mL into the vein. Used in hydration bag 4 times weekly    [provider]  traZODone (DESYREL) 100 MG tablet Take 2 tablets (200 mg total) by mouth at bedtime as needed for sleep. 09/13/20   [name], [name], MD  vitamin B-12 (CYANOCOBALAMIN) 100 MCG tablet Take 1,000 mcg by mouth daily.     [provider]    Physical Exam: Vitals:   12/05/20 2300 12/06/20 0000 12/06/20 0030 12/06/20 0100  BP: (!) 176/76 (!) 161/72 (!) 172/82 (!) 180/87  Pulse: 78 77 84 79  Resp: (!) _0 Temp:      TempSrc:      SpO2: 96% 96% 99% 98%    Constitutional: NAD, calm, comfortable Vitals:   12/05/20 2300 12/06/20 0000 12/06/20 0030 12/06/20 0100  BP: (!) 176/76 (!) 161/72 (!) 172/82 (!) 180/87  Pulse: 78 77 84 79  Resp: (!) _1 Temp:      TempSrc:      SpO2: 96% 96% 99% 98%   General: WDWN, Alert and oriented x3.  Eyes: EOMI, PERRL, conjunctivae normal.  Sclera nonicteric HENT:  /AT, external ears normal.  Nares patent without epistasis.  Mucous membranes are dry Neck: Soft, normal range of motion, supple, no masses, Trachea midline Respiratory: clear to auscultation bilaterally, no wheezing, no crackles. Normal respiratory effort. No accessory muscle use.  Cardiovascular: Regular rate and rhythm, no murmurs / rubs / gallops. No extremity edema. Abdomen: Soft, diffuse tenderness palpation, nondistended, no rebound or guarding.  No masses palpated. No hepatosplenomegaly. Bowel sounds normoactive Musculoskeletal: FROM. no cyanosis. Normal muscle tone.  Skin: Warm, dry, intact no rashes, lesions, ulcers. No induration Neurologic: Normal speech.  Sensation intact, Strength 5/5 in all extremities.   Psychiatric: Normal judgment and insight.  Normal mood.  Labs on Admission: I have personally reviewed following labs and imaging studies  CBC: Recent Labs  Lab 12/05/20 2339  WBC 2.9*  NEUTROABS 1.9  HGB 9.2*  HCT  27.7*  MCV 91.7  PLT 139*    Basic Metabolic Panel: Recent Labs  Lab 12/05/20 2339  NA 136  K 4.0  CL 103  CO2 24  GLUCOSE 131*  BUN 27*  CREATININE 1.30*  CALCIUM 8.8*    GFR: Estimated Creatinine Clearance: 47 mL/min (A) (by C-G formula based on SCr of 1.3 mg/dL (H)).  Liver Function Tests: Recent Labs  Lab 12/05/20 2339  AST 32  ALT 32  ALKPHOS 163*  BILITOT 0.6  PROT 7.1  ALBUMIN 3.4*    Urine analysis:    Component Value Date/Time   COLORURINE YELLOW 12/05/2020 2254   APPEARANCEUR CLEAR 12/05/2020 2254   LABSPEC 1.013 12/05/2020 2254   PHURINE 6.0 12/05/2020 2254   GLUCOSEU NEGATIVE 12/05/2020 2254   HGBUR NEGATIVE 12/05/2020 2254   [place] NEGATIVE 12/05/2020 2254   KETONESUR NEGATIVE 12/05/2020 2254   PROTEINUR 100 (A) 12/05/2020 2254   NITRITE NEGATIVE 12/05/2020 2254   LEUKOCYTESUR NEGATIVE 12/05/2020 2254    Radiological Exams on Admission: DG Chest Port 1 View  Result Date: 12/05/2020 CLINICAL DATA:  Fever EXAM: PORTABLE CHEST 1 VIEW COMPARISON:  11/07/2020 FINDINGS: Mild cardiomegaly. No confluent opacities or effusions. No acute bony abnormality. IMPRESSION: No active disease. Electronically Signed   By: Rolm Baptise M.D.   On: 12/05/2020 23:12    EKG: Independently reviewed.  EKG shows normal sinus rhythm with no acute ST elevation or depression.  QTc 453  Assessment/Plan Principal Problem:   COVID-19 virus infection Sarted on remdesivir Supplemental oxygen provided if needed to keep O2 sat between 92 to 96% Albuterol MDI every 4 hours as needed for wheezing cough, shortness of breath Antitussives provided Incentive spirometer every 1-2 hours while awake  Active Problems:   Fever This [place] has fever with concern of possible bacteremia as she has had multiple admissions for bacteremia in the last 6 to 12 months.  Have a PICC line in place and has had infected PICC lines in the past. Could also be secondary to COVID which was  found incidentally.    HTN (hypertension), benign Continue Norvasc.  Monitor blood pressure    CKD (chronic kidney disease) stage 3, GFR 30-59 ml/min  Chronic and stable Electrolytes and renal function with labs in morning    Protein calorie malnutrition  TPN per pharmacy consult    Chronic diarrhea Chronic secondary to Crohn's disease.    Crohn's disease of small & large intestine with SGS Chronic    On total parenteral nutrition (TPN) Continue TPN.  PICC line was scheduled to be evaluated tomorrow and will have this done while she is in the hospital    History of central line-associated bloodstream infection (CLABSI) Blood cultures obtained in the emergency room and will be monitored    DVT prophylaxis: Lovenox for DVT prophylaxis. High [place]ua score  Code Status:   Full code Family Communication:  Diagnosis and plan as discussed with the patient.  She verbalized understanding and agrees with plan.  Further recommendations to follow as clinically indicated Disposition Plan:   Patient is from:  Home  Anticipated DC to:  Home  Anticipated DC date:  Anticipate less than 2 midnight stay unless blood cultures are positive  Anticipated DC barriers: Blood cultures being positive will delay discharge and require patient      `  being converted to inpatient status  Admission status:   Observation  Caitlin Aline Alayja Armas MD [facility]  How to contact the [facility] Attending or Consulting provider [place] or covering provider during after hours [place], for this patient?   1. Check the care team in [facility] and look for a) attending/consulting TR[facility] provider listed and b) the [facility] team listed 2. Log into [email or web address] and use [place]'s universal password to access. If you do not have the password, please contact the hospital operator. 3. Locate the [facility] provider you are looking for under [facility] and page to a number that you can be directly reached. 4. If you still have difficulty  reaching the provider, please page the [facility] (Director on Call) for the Hospitalists listed on amion for assistance.  12/06/2020, 3:06 AM

## 2020-12-06 NOTE — ED Notes (Signed)
Patient is resting comfortably. 

## 2020-12-06 NOTE — Progress Notes (Signed)
Subjective: Patient admitted this morning, see detailed H&P by Dr Tonie Griffith 60 year old female with medical history of pancreatitis, chronic pain syndrome, chronic kidney disease, Crohn's disease with short gut syndrome, TPN dependency, GERD, hypertension, ideations anemia, malnutrition, B12 deficiency came for evaluation of fever.  Patient said that she has been running fever since yesterday and also had body aches.  Patient was noted multiple times over last year for bacteremia secondary to infected PICC line.  PICC line was changed a few weeks ago.  She recently finished course of antibiotics with meropenem 10 days ago.Patient also found to have incidental COVID-19 infection.  Vitals:   12/06/20 1430 12/06/20 1500  BP: (!) 163/81 (!) 172/82  Pulse: 68 65  Resp: 13 14  Temp:    SpO2: 99% 97%      A/P  Fever-likely from COVID-19 infection versus bacteremia from PICC line.  Patient has been started on remdesivir, she is currently not requiring oxygen.  Blood cultures x2 have been obtained.  I called and discussed with ID, no need of antibiotics if patient is hemodynamically stable.  They recommend consulting them once blood cultures turns positive.  TPN dependence-we will hold  TPN till IV team inspects PICC line.  Follow blood culture results.  Hypertension-blood pressure is elevated, continue Norvasc, will start hydralazine as needed.  CKD stage III-creatinine stable    [place] Hospitalist [phone]

## 2020-12-07 ENCOUNTER — Other Ambulatory Visit: Payer: Self-pay | Admitting: Internal Medicine

## 2020-12-07 ENCOUNTER — Inpatient Hospital Stay: Payer: Medicare Other | Admitting: Internal Medicine

## 2020-12-07 DIAGNOSIS — I129 Hypertensive chronic kidney disease with stage 1 through stage 4 chronic kidney disease, or unspecified chronic kidney disease: Secondary | ICD-10-CM | POA: Diagnosis present

## 2020-12-07 DIAGNOSIS — E538 Deficiency of other specified B group vitamins: Secondary | ICD-10-CM | POA: Diagnosis present

## 2020-12-07 DIAGNOSIS — Z888 Allergy status to other drugs, medicaments and biological substances status: Secondary | ICD-10-CM | POA: Diagnosis not present

## 2020-12-07 DIAGNOSIS — G894 Chronic pain syndrome: Secondary | ICD-10-CM | POA: Diagnosis present

## 2020-12-07 DIAGNOSIS — Z87891 Personal history of nicotine dependence: Secondary | ICD-10-CM | POA: Diagnosis not present

## 2020-12-07 DIAGNOSIS — Z765 Malingerer [conscious simulation]: Secondary | ICD-10-CM | POA: Diagnosis not present

## 2020-12-07 DIAGNOSIS — I10 Essential (primary) hypertension: Secondary | ICD-10-CM

## 2020-12-07 DIAGNOSIS — K912 Postsurgical malabsorption, not elsewhere classified: Secondary | ICD-10-CM | POA: Diagnosis present

## 2020-12-07 DIAGNOSIS — R509 Fever, unspecified: Secondary | ICD-10-CM | POA: Diagnosis present

## 2020-12-07 DIAGNOSIS — K219 Gastro-esophageal reflux disease without esophagitis: Secondary | ICD-10-CM | POA: Diagnosis present

## 2020-12-07 DIAGNOSIS — K50819 Crohn's disease of both small and large intestine with unspecified complications: Secondary | ICD-10-CM

## 2020-12-07 DIAGNOSIS — N179 Acute kidney failure, unspecified: Secondary | ICD-10-CM | POA: Diagnosis present

## 2020-12-07 DIAGNOSIS — Z820 Family history of epilepsy and other diseases of the nervous system: Secondary | ICD-10-CM | POA: Diagnosis not present

## 2020-12-07 DIAGNOSIS — Z7952 Long term (current) use of systemic steroids: Secondary | ICD-10-CM | POA: Diagnosis not present

## 2020-12-07 DIAGNOSIS — N1831 Chronic kidney disease, stage 3a: Secondary | ICD-10-CM | POA: Diagnosis present

## 2020-12-07 DIAGNOSIS — Z8249 Family history of ischemic heart disease and other diseases of the circulatory system: Secondary | ICD-10-CM | POA: Diagnosis not present

## 2020-12-07 DIAGNOSIS — Z79899 Other long term (current) drug therapy: Secondary | ICD-10-CM | POA: Diagnosis not present

## 2020-12-07 DIAGNOSIS — D509 Iron deficiency anemia, unspecified: Secondary | ICD-10-CM | POA: Diagnosis present

## 2020-12-07 DIAGNOSIS — R7989 Other specified abnormal findings of blood chemistry: Secondary | ICD-10-CM | POA: Diagnosis present

## 2020-12-07 DIAGNOSIS — Z6821 Body mass index (BMI) 21.0-21.9, adult: Secondary | ICD-10-CM | POA: Diagnosis not present

## 2020-12-07 DIAGNOSIS — U071 COVID-19: Secondary | ICD-10-CM | POA: Diagnosis present

## 2020-12-07 DIAGNOSIS — Z833 Family history of diabetes mellitus: Secondary | ICD-10-CM | POA: Diagnosis not present

## 2020-12-07 DIAGNOSIS — F32A Depression, unspecified: Secondary | ICD-10-CM | POA: Diagnosis present

## 2020-12-07 DIAGNOSIS — Z832 Family history of diseases of the blood and blood-forming organs and certain disorders involving the immune mechanism: Secondary | ICD-10-CM | POA: Diagnosis not present

## 2020-12-07 DIAGNOSIS — K5080 Crohn's disease of both small and large intestine without complications: Secondary | ICD-10-CM | POA: Diagnosis present

## 2020-12-07 DIAGNOSIS — E43 Unspecified severe protein-calorie malnutrition: Secondary | ICD-10-CM | POA: Diagnosis present

## 2020-12-07 DIAGNOSIS — Z83511 Family history of glaucoma: Secondary | ICD-10-CM | POA: Diagnosis not present

## 2020-12-07 LAB — COMPREHENSIVE METABOLIC PANEL
ALT: 33 U/L (ref 0–44)
AST: 29 U/L (ref 15–41)
Albumin: 3.1 g/dL — ABNORMAL LOW (ref 3.5–5.0)
Alkaline Phosphatase: 140 U/L — ABNORMAL HIGH (ref 38–126)
Anion gap: 13 (ref 5–15)
BUN: 19 mg/dL (ref 6–20)
CO2: 23 mmol/L (ref 22–32)
Calcium: 9.2 mg/dL (ref 8.9–10.3)
Chloride: 103 mmol/L (ref 98–111)
Creatinine, Ser: 1.22 mg/dL — ABNORMAL HIGH (ref 0.44–1.00)
GFR, Estimated: 51 mL/min — ABNORMAL LOW (ref >60)
Glucose, Bld: 88 mg/dL (ref 70–99)
Potassium: 3.5 mmol/L (ref 3.5–5.1)
Sodium: 139 mmol/L (ref 135–145)
Total Bilirubin: 0.4 mg/dL (ref 0.3–1.2)
Total Protein: 6.7 g/dL (ref 6.5–8.1)

## 2020-12-07 LAB — CBC WITH DIFFERENTIAL/PLATELET
Abs Immature Granulocytes: 0.02 10*3/uL (ref 0.00–0.07)
Basophils Absolute: 0 10*3/uL (ref 0.0–0.1)
Basophils Relative: 0 %
Eosinophils Absolute: 0 10*3/uL (ref 0.0–0.5)
Eosinophils Relative: 0 %
HCT: 29.2 % — ABNORMAL LOW (ref 36.0–46.0)
Hemoglobin: 9.7 g/dL — ABNORMAL LOW (ref 12.0–15.0)
Immature Granulocytes: 1 %
Lymphocytes Relative: 47 %
Lymphs Abs: 1.6 10*3/uL (ref 0.7–4.0)
MCH: 30.1 pg (ref 26.0–34.0)
MCHC: 33.2 g/dL (ref 30.0–36.0)
MCV: 90.7 fL (ref 80.0–100.0)
Monocytes Absolute: 0.3 10*3/uL (ref 0.1–1.0)
Monocytes Relative: 10 %
Neutro Abs: 1.4 10*3/uL — ABNORMAL LOW (ref 1.7–7.7)
Neutrophils Relative %: 42 %
Platelets: 156 10*3/uL (ref 150–400)
RBC: 3.22 MIL/uL — ABNORMAL LOW (ref 3.87–5.11)
RDW: 14.4 % (ref 11.5–15.5)
WBC: 3.3 10*3/uL — ABNORMAL LOW (ref 4.0–10.5)
nRBC: 0 % (ref 0.0–0.2)

## 2020-12-07 LAB — URINE CULTURE

## 2020-12-07 LAB — C-REACTIVE PROTEIN: CRP: 3.6 mg/dL — ABNORMAL HIGH (ref <1.0)

## 2020-12-07 LAB — PHOSPHORUS: Phosphorus: 2.6 mg/dL (ref 2.5–4.6)

## 2020-12-07 LAB — TRIGLYCERIDES: Triglycerides: 129 mg/dL (ref <150)

## 2020-12-07 LAB — GLUCOSE, CAPILLARY: Glucose-Capillary: 102 mg/dL — ABNORMAL HIGH (ref 70–99)

## 2020-12-07 LAB — MAGNESIUM: Magnesium: 1.7 mg/dL (ref 1.7–2.4)

## 2020-12-07 LAB — PROCALCITONIN: Procalcitonin: <0.1 ng/mL

## 2020-12-07 LAB — PREALBUMIN: Prealbumin: 19.4 mg/dL (ref 18–38)

## 2020-12-07 MED ORDER — SODIUM CHLORIDE 0.9 % IV SOLN: INTRAVENOUS | Status: DC

## 2020-12-07 MED ORDER — DIPHENOXYLATE-ATROPINE 2.5-0.025 MG PO TABS: 1.0000 | ORAL_TABLET | Freq: Four times a day (QID) | ORAL | Status: DC | PRN

## 2020-12-07 MED ORDER — TRACE MINERALS CU-MN-SE-ZN 300-55-60-3000 MCG/ML IV SOLN
INTRAVENOUS | Status: DC
  Filled 2020-12-07: qty 376

## 2020-12-07 MED ORDER — CYCLOSPORINE 0.05 % OP EMUL
1.0000 [drp] | Freq: Two times a day (BID) | OPHTHALMIC | Status: DC
  Administered 2020-12-07 – 2020-12-08 (×2): 1 [drp] via OPHTHALMIC
  Filled 2020-12-07 (×3): qty 1

## 2020-12-07 MED ORDER — DIPHENHYDRAMINE HCL 50 MG/ML IJ SOLN
12.5000 mg | Freq: Once | INTRAMUSCULAR | Status: AC
  Administered 2020-12-07: 12.5 mg via INTRAVENOUS
  Filled 2020-12-07: qty 1

## 2020-12-07 MED ORDER — METHADONE HCL 10 MG PO TABS
5.0000 mg | ORAL_TABLET | Freq: Every day | ORAL | Status: DC
  Administered 2020-12-07 – 2020-12-08 (×3): 5 mg via ORAL
  Filled 2020-12-07 (×5): qty 1

## 2020-12-07 MED ORDER — AMLODIPINE BESYLATE 10 MG PO TABS: 10.0000 mg | ORAL_TABLET | Freq: Every day | ORAL | 1 refills | Status: DC

## 2020-12-07 MED ORDER — INSULIN ASPART 100 UNIT/ML ~~LOC~~ SOLN
0.0000 [IU] | Freq: Four times a day (QID) | SUBCUTANEOUS | Status: DC
  Administered 2020-12-08: 1 [IU] via SUBCUTANEOUS
  Filled 2020-12-07: qty 0.09

## 2020-12-07 MED ORDER — LORAZEPAM 2 MG/ML IJ SOLN
0.5000 mg | Freq: Once | INTRAMUSCULAR | Status: AC
  Administered 2020-12-07: 0.5 mg via INTRAVENOUS
  Filled 2020-12-07: qty 1

## 2020-12-07 MED ORDER — CALCIUM CARBONATE 1250 (500 CA) MG PO TABS
625.0000 mg | ORAL_TABLET | Freq: Every day | ORAL | Status: DC
Start: 2020-12-08 — End: 2020-12-08

## 2020-12-07 MED ORDER — PANTOPRAZOLE SODIUM 40 MG PO TBEC
40.0000 mg | DELAYED_RELEASE_TABLET | Freq: Every day | ORAL | Status: DC
  Administered 2020-12-08: 40 mg via ORAL
  Filled 2020-12-07: qty 1

## 2020-12-07 MED ORDER — LACTINEX PO CHEW
1.0000 | CHEWABLE_TABLET | Freq: Every day | ORAL | Status: DC
  Administered 2020-12-08: 1 via ORAL
  Filled 2020-12-07 (×2): qty 1

## 2020-12-07 MED ORDER — BUDESONIDE 3 MG PO CPEP
9.0000 mg | ORAL_CAPSULE | Freq: Every day | ORAL | Status: DC
Start: 2020-12-07 — End: 2020-12-08
  Administered 2020-12-08: 9 mg via ORAL
  Filled 2020-12-07 (×2): qty 3

## 2020-12-07 MED ORDER — METOPROLOL TARTRATE 100 MG PO TABS: 100.0000 mg | ORAL_TABLET | Freq: Two times a day (BID) | ORAL | 1 refills | Status: DC

## 2020-12-07 MED ORDER — PROMETHAZINE HCL 25 MG PO TABS: 25.0000 mg | ORAL_TABLET | Freq: Four times a day (QID) | ORAL | 0 refills | Status: DC | PRN

## 2020-12-07 MED ORDER — ESTRADIOL 1 MG PO TABS
2.0000 mg | ORAL_TABLET | Freq: Every day | ORAL | Status: DC
  Administered 2020-12-07 – 2020-12-08 (×2): 2 mg via ORAL
  Filled 2020-12-07 (×2): qty 2

## 2020-12-07 MED ORDER — DULOXETINE HCL 60 MG PO CPEP
90.0000 mg | ORAL_CAPSULE | Freq: Every day | ORAL | Status: DC
  Administered 2020-12-08: 90 mg via ORAL
  Filled 2020-12-07: qty 1

## 2020-12-07 MED ORDER — HYDROMORPHONE HCL 2 MG PO TABS
4.0000 mg | ORAL_TABLET | Freq: Four times a day (QID) | ORAL | Status: DC | PRN
  Administered 2020-12-07 – 2020-12-08 (×3): 4 mg via ORAL
  Filled 2020-12-07 (×3): qty 2

## 2020-12-07 MED ORDER — METOPROLOL TARTRATE 50 MG PO TABS
100.0000 mg | ORAL_TABLET | Freq: Two times a day (BID) | ORAL | Status: DC
  Administered 2020-12-07 – 2020-12-08 (×2): 100 mg via ORAL
  Filled 2020-12-07 (×2): qty 2

## 2020-12-07 MED ORDER — PANCRELIPASE (LIP-PROT-AMYL) 12000-38000 UNITS PO CPEP
36000.0000 [IU] | ORAL_CAPSULE | Freq: Three times a day (TID) | ORAL | Status: DC | PRN
  Filled 2020-12-07: qty 1

## 2020-12-07 MED ORDER — ESTRADIOL 2 MG PO TABS: 2.0000 mg | ORAL_TABLET | Freq: Every day | ORAL | 1 refills | Status: DC

## 2020-12-07 MED ORDER — TRAVASOL 10 % IV SOLN
INTRAVENOUS | Status: DC
  Filled 2020-12-07: qty 564

## 2020-12-07 MED ORDER — BUPROPION HCL ER (SR) 100 MG PO TB12
200.0000 mg | ORAL_TABLET | Freq: Every day | ORAL | Status: DC
  Administered 2020-12-08: 200 mg via ORAL
  Filled 2020-12-07 (×2): qty 2

## 2020-12-07 MED ORDER — DULOXETINE HCL 30 MG PO CPEP: 90.0000 mg | ORAL_CAPSULE | Freq: Every day | ORAL | 1 refills | Status: DC

## 2020-12-07 NOTE — ED Notes (Signed)
Patient refusing all medication due to abd cramping. Patient states she wants her IV pain medication back. MD notified that patient refused medication

## 2020-12-07 NOTE — Progress Notes (Addendum)
PHARMACY - TOTAL PARENTERAL NUTRITION CONSULT NOTE   Indication: Short bowel syndrome  Patient Measurements:     There is no height or weight on file to calculate BMI. TBW not charted  Assessment: 46 yoF, hx of Short gut syndrome on chronic cyclic TPN, can tolerate po.  Admit with Covid PNA, need to r/o line infection, IV team inspected central line - no sign infection. Can resume TPN.  Will order continuous TPN since patient still in ED, possible discharge home if PCT neg and afebrile.  Glucose / Insulin: serum glucose 88 this am Electrolytes: wnl Renal: SCr 1.22 LFTs / TGs: TG 129,  Prealbumin / albumin: Pre-Alb 19.4  Intake / Output; MIVF: NS at 75 ml/hr GI Imaging: Surgeries / Procedures:   Central access: chronic PICC TPN start date: resumed 1/12  Nutritional Goals:  Goal TPN rate 75 mL/hr (provides 85 g of protein and ~ 1400 kcals per day)  Current Nutrition: NPO  Plan:  Start TPN at 38m/hr at 1800, continuous rate Electrolytes in TPN: 584m/L of Na, 5035mL of K, 5mE85m of Ca, 5mEq18mof Mg, and 15mmo90mof Phos. Cl:Ac ratio 1:1 Add standard MVI and trace elements to TPN Initiate Sensitive scale SSI q6h and adjust as needed  Reduce MIVF to 25 mL/hr at 1800, note IV fluid ordered to end 1/13  Monitor TPN labs on Mon/Thurs if pt not discharged 1/13, and would adjust TPN to cyclic rate (based on home regimen) if admitted to floor bed  Praneeth Bussey,Minda DittoD 12/07/2020,12:07 PM

## 2020-12-07 NOTE — Progress Notes (Signed)
[facility] PROGRESS NOTE    Progress Note  PRAISE STENNETT  TIW:[id number] DOB: [birth year] DOA: 12/05/2020 PCP: [name], [name], MD     Brief Narrative:   Caitlin Peters is an 60 y.o. 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  She denies any cough or shortness of breath.  Assessment/Plan:   Fever likely due to COVID-19 virus infection: As a fever or cough, mild leukopenia, I have personally reviewed the chest x-ray does not show any infiltrates. Check a procalcitonin level. She was started empirically on IV remdesivir. Continue to hold empiric antibiotics.  Check a procalcitonin if she remains afebrile and procalcitonin is negative we could start thinking about getting her home tomorrow.  Acute kidney injury on chronic kidney disease stage IIIa: Likely prerenal azotemia with a baseline creatinine of around 1, will start her on IV fluids recheck basic metabolic panel in the morning.  Essential hypertension: Blood pressure stable continue Norvasc.    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    Chronic diarrhea Noted.  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  DVT prophylaxis: lovenoxn Family Communication:none Status is: Observation  The patient remains OBS appropriate and will d/c before 2 midnights.  Dispo: The patient is from: Home              Anticipated d/c is to: Home              Anticipated d/c date is: 1 day              Patient currently is not medically stable to d/c.        Code Status:     Code Status Orders  (From admission, onward)          Start     Ordered   12/06/20 0336  Full code  Continuous        12/06/20 0335        Code Status History    Date Active Date Inactive Code Status Order ID Comments User Context   11/07/2020 0249 11/10/2020 2031 Full Code [id number]  Rise Patience, MD ED   10/21/2020 1308 10/22/2020 1502 Full Code [id number]  Harold Hedge, MD ED   09/01/2020 0401 09/13/2020 0025 Full Code [id number]  Rise Patience, MD ED   08/03/2020 0322 08/12/2020 2234 Full Code [id number]  Shela Leff, MD ED   07/19/2020 1714 07/23/2020 2143 Full Code [id number]  Mercy Riding, MD ED   06/27/2020 2115 07/02/2020 2253 Full Code [id number]  Orene Desanctis, DO ED   05/05/2020 2119 05/10/2020 2314 Full Code [id number]  Lenore Cordia, MD ED   04/13/2020 1819 04/18/2020 2333 Full Code [id number]  Damita Lack, MD Inpatient   03/05/2020 1111 03/11/2020 2310 Full Code [id number]  Karmen Bongo, MD Inpatient   01/02/2020 1248 01/11/2020 2339 Full Code [id number]  Norval Morton, MD ED   11/25/2019 2257 11/30/2019 2333 Full Code [id number]  Edmonia Lynch, DO ED   10/28/2019 0815 10/31/2019 2114 Full Code [id number]  Norval Morton, MD ED   10/12/2019 2352 10/14/2019 1618 Full Code [id number]  Phill Myron  Ander Purpura, DO ED   Advance Care Planning Activity        IV Access:    Peripheral IV   Procedures and diagnostic studies:   DG Chest Port 1 View  Result Date: 12/05/2020 CLINICAL DATA:  Fever EXAM: PORTABLE CHEST 1 VIEW COMPARISON:  11/07/2020 FINDINGS: Mild cardiomegaly. No confluent opacities or effusions. No acute bony abnormality. IMPRESSION: No active disease. Electronically Signed   By: Rolm Baptise M.D.   On: 12/05/2020 23:12     Medical Consultants:    None.  Anti-Infectives:   remdesivir  Subjective:    [name] she relates she feels about the same still coughing and tired.  Objective:    Vitals:   12/07/20 0500 12/07/20 0530 12/07/20 0603 12/07/20 0930  BP: (!)  177/76 (!) 174/74 (!) 168/85 (!) 157/88  Pulse: 66 69 65 78  Resp: 13 12 15 19   Temp:   99 F (37.2 C)   TempSrc:   Oral   SpO2: 97% 97% 97% 97%   SpO2: 97 %  No intake or output data in the 24 hours ending 12/07/20 1102 There were no vitals filed for this visit.  Exam: General exam: In no acute distress. Respiratory system: Good air movement and clear to auscultation. Cardiovascular system: S1 & S2 heard, RRR. No JVD. Gastrointestinal system: Abdomen is nondistended, soft and nontender.  Extremities: No pedal edema. Skin: No rashes, lesions or ulcers   Data Reviewed:    Labs: Basic Metabolic Panel: Recent Labs  Lab 12/05/20 2339 12/06/20 0346 12/07/20 0609  NA 136  --  139  K 4.0  --  3.5  CL 103  --  103  CO2 24  --  23  GLUCOSE 131*  --  88  BUN 27*  --  19  CREATININE 1.30* 1.29* 1.22*  CALCIUM 8.8*  --  9.2  MG  --   --  1.7  PHOS  --   --  2.6   GFR CrCl cannot be calculated (Unknown ideal weight.). Liver Function Tests: Recent Labs  Lab 12/05/20 2339 12/07/20 0609  AST 32 29  ALT 32 33  ALKPHOS 163* 140*  BILITOT 0.6 0.4  PROT 7.1 6.7  ALBUMIN 3.4* 3.1*   No results for input(s): LIPASE, AMYLASE in the last 168 hours. No results for input(s): AMMONIA in the last 168 hours. Coagulation profile No results for input(s): INR, PROTIME in the last 168 hours. COVID-19 Labs  Recent Labs    12/07/20 0609  CRP 3.6*    Lab Results  Component Value Date   SARSCOV2NAA POSITIVE (A) 12/05/2020   SARSCOV2NAA NEGATIVE 11/07/2020   SARSCOV2NAA NEGATIVE 10/21/2020   [place] NEGATIVE 09/01/2020    CBC: Recent Labs  Lab 12/05/20 2339 12/06/20 0346 12/07/20 0609  WBC 2.9* 2.1* 3.3*  NEUTROABS 1.9  --  1.4*  HGB 9.2* 8.8* 9.7*  HCT 27.7* 26.4* 29.2*  MCV 91.7 92.0 90.7  PLT 139* 128* 156   Cardiac Enzymes: No results for input(s): CKTOTAL, CKMB, CKMBINDEX, TROPONINI in the last 168 hours. BNP (last 3 results) No results for input(s):  PROBNP in the last 8760 hours. CBG: No results for input(s): GLUCAP in the last 168 hours. D-Dimer: No results for input(s): DDIMER in the last 72 hours. Hgb A1c: No results for input(s): HGBA1C in the last 72 hours. Lipid Profile: Recent Labs    12/07/20 0609  TRIG 129   Thyroid function studies: No results for input(s): TSH, T4TOTAL, T3FREE,  THYROIDAB in the last 72 hours.  Invalid input(s): FREET3 Anemia work up: No results for input(s): VITAMINB12, FOLATE, FERRITIN, TIBC, IRON, RETICCTPCT in the last 72 hours. Sepsis Labs: Recent Labs  Lab 12/05/20 0035 12/05/20 2339 12/06/20 0346 12/07/20 0609  WBC  --  2.9* 2.1* 3.3*  LATICACIDVEN 0.5  --   --   --    Microbiology Recent Results (from the past 240 hour(s))  Blood culture (routine x 2)     Status: None (Preliminary result)   Collection Time: 12/05/20 12:01 AM   Specimen: BLOOD LEFT HAND  Result Value Ref Range Status   Specimen Description   Final    BLOOD LEFT HAND Performed at [facility], [place] [address], [place], [place] [address]    Special Requests   Final    BOTTLES DRAWN AEROBIC AND ANAEROBIC Blood Culture adequate volume Performed at [place] [address] [place], [place] [address]    Culture   Final    NO GROWTH 1 DAY Performed at [facility], [place] [address] [place], [place] [address]    Report Status PENDING  Incomplete  Urine culture     Status: Abnormal   Collection Time: 12/05/20 10:54 PM   Specimen: Urine, Random  Result Value Ref Range Status   Specimen Description   Final    URINE, RANDOM Performed at [facility] [address] [place], [place] [address]    Special Requests   Final    NONE Performed at [facility], [place] [address], [place], [place] [address]    Culture MULTIPLE SPECIES PRESENT, SUGGEST RECOLLECTION (A)  Final   Report Status 12/07/2020 FINAL  Final  Resp Panel by RT-PCR  (Flu A&B, Covid) Nasopharyngeal Swab     Status: Abnormal   Collection Time: 12/05/20 11:39 PM   Specimen: Nasopharyngeal Swab; Nasopharyngeal(NP) swabs in vial transport medium  Result Value Ref Range Status   SARS Coronavirus 2 by RT PCR POSITIVE (A) NEGATIVE Final    Comment: RESULT CALLED TO, READ BACK BY AND VERIFIED WITH: PARKER C. 01.11.22 @ 0052 BY MECAIL J. (NOTE) SARS-CoV-2 target nucleic acids are DETECTED.  The SARS-CoV-2 RNA is generally detectable in upper respiratory specimens during the acute phase of infection. Positive results are indicative of the presence of the identified virus, but do not rule out bacterial infection or co-infection with other pathogens not detected by the test. Clinical correlation with patient history and other diagnostic information is necessary to determine patient infection status. The expected result is Negative.  Fact Sheet for Patients: EntrepreneurPulse.com.au  Fact Sheet for Healthcare Providers: IncredibleEmployment.be  This test is not yet approved or cleared by the [place] FDA and  has been authorized for detection and/or diagnosis of SARS-CoV-2 by FDA under an Emergency Use Authorization (EUA).  This EUA will remain in effect (meaning this test c an be used) for the duration of  the COVID-19 declaration under Section 564(b)(1) of the Act, 21 U.S.C. section 360bbb-3(b)(1), unless the authorization is terminated or revoked sooner.     Influenza A by PCR NEGATIVE NEGATIVE Final   Influenza B by PCR NEGATIVE NEGATIVE Final    Comment: (NOTE) The Xpert Xpress SARS-CoV-2/FLU/RSV plus assay is intended as an aid in the diagnosis of influenza from Nasopharyngeal swab specimens and should not be used as a sole basis for treatment. Nasal washings and aspirates are unacceptable for Xpert Xpress SARS-CoV-2/FLU/RSV testing.  Fact Sheet for  Patients: EntrepreneurPulse.com.au  Fact Sheet for Healthcare Providers: IncredibleEmployment.be  This test is not yet approved or cleared by the [place] FDA and has been authorized for detection and/or diagnosis of SARS-CoV-2 by FDA under an Emergency Use Authorization (EUA). This EUA will remain in effect (meaning this test can be used) for the duration of the COVID-19 declaration under Section 564(b)(1) of the Act, 21 U.S.C. section 360bbb-3(b)(1), unless the authorization is terminated or revoked.  Performed at [facility], [place] [address], [place], [place] [address]   Blood culture (routine x 2)     Status: None (Preliminary result)   Collection Time: 12/05/20 11:40 PM   Specimen: BLOOD RIGHT FOREARM  Result Value Ref Range Status   Specimen Description   Final    BLOOD RIGHT FOREARM Performed at [facility] [address], [place], [place] [address]    Special Requests   Final    BOTTLES DRAWN AEROBIC AND ANAEROBIC Blood Culture adequate volume Performed at [place] [place] [address] [place], [place] [address]    Culture   Final    NO GROWTH 1 DAY Performed at [facility], [place] [address] [place], [place] [address]    Report Status PENDING  Incomplete     Medications:   . enoxaparin (LOVENOX) injection  40 mg Subcutaneous Q24H  . metoprolol tartrate  5 mg Intravenous Q8H   Continuous Infusions: . lactated ringers 75 mL/hr at 12/07/20 0925  . remdesivir 100 mg in NS 100 mL 100 mg (12/07/20 0927)      LOS: 0 days   Charlynne Cousins  [facility]  12/07/2020, 11:02 AM

## 2020-12-07 NOTE — ED Notes (Signed)
CVC site without noted s/s of any type of infection. Pt states her fever is from Covid. She maintains site at home and is well aware of s/s of infection or any other problems that may be encountered with line.

## 2020-12-07 NOTE — ED Notes (Signed)
Contacted MD about using right femoral CVC or obtaining new line, MD doesn't want to delay treatment. Contacted IV team they stated need to contact interventional radiology if MD would like a new line.

## 2020-12-07 NOTE — Telephone Encounter (Signed)
Patient is calling and requesting a refill for amLODipine (NORVASC) 10 MG tablet , promethazine (PHENERGAN) 25 MG tablet , Wellbutrin, DULoxetine (CYMBALTA) 30 MG capsule, estradiol (ESTRACE) 2 MG tablet andmetoprolol tartrate (LOPRESSOR) 100 MG sent to [facility] on [address], please advise. CB is [phone]

## 2020-12-08 ENCOUNTER — Telehealth: Payer: Self-pay | Admitting: Internal Medicine

## 2020-12-08 DIAGNOSIS — N1831 Chronic kidney disease, stage 3a: Secondary | ICD-10-CM | POA: Diagnosis not present

## 2020-12-08 DIAGNOSIS — I10 Essential (primary) hypertension: Secondary | ICD-10-CM

## 2020-12-08 DIAGNOSIS — K50819 Crohn's disease of both small and large intestine with unspecified complications: Secondary | ICD-10-CM | POA: Diagnosis not present

## 2020-12-08 DIAGNOSIS — R509 Fever, unspecified: Secondary | ICD-10-CM | POA: Diagnosis not present

## 2020-12-08 DIAGNOSIS — U071 COVID-19: Secondary | ICD-10-CM | POA: Diagnosis not present

## 2020-12-08 LAB — CBC WITH DIFFERENTIAL/PLATELET
Abs Immature Granulocytes: 0 10*3/uL (ref 0.00–0.07)
Basophils Absolute: 0 10*3/uL (ref 0.0–0.1)
Basophils Relative: 1 %
Eosinophils Absolute: 0.1 10*3/uL (ref 0.0–0.5)
Eosinophils Relative: 3 %
HCT: 29.3 % — ABNORMAL LOW (ref 36.0–46.0)
Hemoglobin: 9.6 g/dL — ABNORMAL LOW (ref 12.0–15.0)
Immature Granulocytes: 0 %
Lymphocytes Relative: 60 %
Lymphs Abs: 1.3 10*3/uL (ref 0.7–4.0)
MCH: 30.9 pg (ref 26.0–34.0)
MCHC: 32.8 g/dL (ref 30.0–36.0)
MCV: 94.2 fL (ref 80.0–100.0)
Monocytes Absolute: 0.2 10*3/uL (ref 0.1–1.0)
Monocytes Relative: 10 %
Neutro Abs: 0.6 10*3/uL — ABNORMAL LOW (ref 1.7–7.7)
Neutrophils Relative %: 26 %
Platelets: 145 10*3/uL — ABNORMAL LOW (ref 150–400)
RBC: 3.11 MIL/uL — ABNORMAL LOW (ref 3.87–5.11)
RDW: 14.2 % (ref 11.5–15.5)
WBC: 2.2 10*3/uL — ABNORMAL LOW (ref 4.0–10.5)
nRBC: 0 % (ref 0.0–0.2)

## 2020-12-08 LAB — C-REACTIVE PROTEIN: CRP: 3 mg/dL — ABNORMAL HIGH (ref <1.0)

## 2020-12-08 LAB — GLUCOSE, CAPILLARY
Glucose-Capillary: 122 mg/dL — ABNORMAL HIGH (ref 70–99)
Glucose-Capillary: 122 mg/dL — ABNORMAL HIGH (ref 70–99)

## 2020-12-08 LAB — PROCALCITONIN: Procalcitonin: <0.1 ng/mL

## 2020-12-08 MED ORDER — BUPROPION HCL ER (SR) 100 MG PO TB12: 200.0000 mg | ORAL_TABLET | Freq: Every day | ORAL | 5 refills | Status: DC

## 2020-12-08 MED ORDER — HEPARIN SOD (PORK) LOCK FLUSH 100 UNIT/ML IV SOLN
250.0000 [IU] | INTRAVENOUS | Status: AC | PRN
  Administered 2020-12-08: 250 [IU]
  Filled 2020-12-08: qty 2.5

## 2020-12-08 NOTE — Discharge Summary (Signed)
Physician Discharge Summary  Caitlin Peters SEG:[id number] DOB: 15-Mar-1961 DOA: 12/05/2020  PCP: [name], [name], MD  Admit date: 12/05/2020 Discharge date: 12/08/2020  Admitted From: Home Disposition:  Home  Recommendations for Outpatient Follow-up:  1. Follow up with PCP in 1-2 weeks 2. Please obtain BMP/CBC in one week 3. PCP to taper down her narcotics  Home Health:Yes Equipment/Devices:None  Discharge Condition:Stable CODE STATUS:Full Diet recommendation: Heart Healthy  Brief/Interim Summary: 60 y.o. 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  She denies any cough or shortness of breath.  Discharge Diagnoses:  Principal Problem:   COVID-19 virus infection Active Problems:   Chronic diarrhea   Crohn's disease of small & large intestine with SGS   Fever   HTN (hypertension), benign   On total parenteral nutrition (TPN)   History of central line-associated bloodstream infection (CLABSI)   CKD (chronic kidney disease) stage 3, GFR 30-59 ml/min ([name])   Protein calorie malnutrition ([name]) Fever likely due to COVID-19: She reported fever cough, leukopenia personally reviewed her chest x-ray does not show any infiltrates procalcitonin was low yield. Due to her high risk she was started on IV remdesivir which she completed 3-day course in-house. She will be discharged in stable condition.  Acute kidney injury on chronic kidney disease stage IIIa: Likely prerenal azotemia with baseline creatinine 1.0 on admission 1.2 she was started on IV fluids and TPN and her renal dysfunction improved.  Narcotic seeking behavior: The patient refused  most of her meds as she was not getting IV Dilaudid, I told her that she does not have any indications for IV narcotics, she was continued on her methadone and her oral Dilaudid and she was sleepy on the day of discharge which she was hard to wake up. All her narcotics were stopped in house she will have to be reevaluated by her PCP as an outpatient.  Central hypertension: No change made to her medication.  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  Chronic diarrhea: Continue multiple episodes in house.  Crohn's disease: Noted.   Discharge Instructions  Discharge Instructions    Diet - low sodium heart healthy   Complete by: As directed    Increase activity slowly   Complete by: As directed      Allergies as of 12/08/2020      Reactions   Meperidine Hives   Other reaction(s): GI Upset Due to Chrones   Hyoscyamine Hives, Swelling   Legs swelling Disorientation   Cefepime Other (See Comments)   Neurotoxicity occurring in setting of AKI. Ceftriaxone tolerated during same admit   Gabapentin Other (See Comments)   unknown   Lyrica [pregabalin] Other (See Comments)   unknown   Topamax [topiramate] Other (See Comments)   unknown   Zosyn [piperacillin Sod-tazobactam So]    Patient reports it makes her vomit, her neck stiff, and her "heart feel funny"   Fentanyl Rash   Pt is allergic to fentanyl patch related to the glue (gives her a rash) Pt states she is NOT allergic to fentanyl IV medicine   Morphine And Related Rash      Medication List    TAKE these medications  acetaminophen 325 MG tablet Commonly known as: TYLENOL Take 650 mg by mouth every 6 (six) hours as needed for mild pain.   amLODipine 10 MG tablet Commonly known as: NORVASC Take 1 tablet (10 mg total) by mouth daily.   budesonide 3 MG 24 hr capsule Commonly known as: ENTOCORT EC TAKE 3 CAPSULES BY MOUTH ONCE DAILY   buPROPion 100 MG 12 hr tablet Commonly  known as: WELLBUTRIN SR Take 2 tablets by mouth once daily   Calcium 200 MG Tabs Take 200 mg by mouth daily.   cholecalciferol 25 MCG (1000 UNIT) tablet Commonly known as: VITAMIN D3 Take 1,000 Units by mouth daily.   cycloSPORINE 0.05 % ophthalmic emulsion Commonly known as: RESTASIS Place 1 drop into both eyes 2 (two) times daily.   Dexilant 60 MG capsule Generic drug: dexlansoprazole Take 1 capsule (60 mg total) by mouth daily.   Dextran 70-Hypromellose 0.1-0.3 % Soln Place 1 drop into both eyes 4 (four) times daily.   diphenoxylate-atropine 2.5-0.025 MG tablet Commonly known as: LOMOTIL TAKE 1 TABLET BY MOUTH 4 TIMES DAILY AS NEEDED FOR DIARRHEA OR  LOOSE  STOOLS What changed: See the new instructions.   DULoxetine 30 MG capsule Commonly known as: CYMBALTA Take 3 capsules (90 mg total) by mouth daily.   estradiol 2 MG tablet Commonly known as: ESTRACE Take 1 tablet (2 mg total) by mouth daily.   hydrALAZINE 50 MG tablet Commonly known as: APRESOLINE Take 1.5 tablets (75 mg total) by mouth 3 (three) times daily.   HYDROmorphone 4 MG tablet Commonly known as: DILAUDID Take 4 mg by mouth 4 (four) times daily as needed for moderate pain.   lipase/protease/amylase 36000 UNITS Cpep capsule Commonly known as: Creon Take 1 capsule (36,000 Units total) by mouth 3 (three) times daily as needed (with meals for digestion).   loperamide 2 MG capsule Commonly known as: IMODIUM Take 2 mg by mouth as needed for diarrhea or loose stools.   methadone 5 MG tablet Commonly known as: DOLOPHINE Take 1 tablet (5 mg total) by mouth 5 (five) times daily.   metoprolol tartrate 100 MG tablet Commonly known as: LOPRESSOR Take 1 tablet (100 mg total) by mouth 2 (two) times daily.   MULTIVITAMIN ADULT PO Take 1 tablet by mouth daily.   Narcan 4 MG/0.1ML Liqd nasal spray kit Generic drug: naloxone Place 1 spray into the nose once.   PRESCRIPTION MEDICATION Inject 1 each into  the vein daily. Home TPN . [facility] . 1 bag for 12 hours. [phone]   PROBIOTIC-10 PO Take 1 capsule by mouth daily.   promethazine 25 MG tablet Commonly known as: PHENERGAN Take 1 tablet (25 mg total) by mouth every 6 (six) hours as needed for nausea or vomiting. What changed: See the new instructions.   sodium chloride 0.9 % infusion Inject into the vein.   sucralfate 1 GM/10ML suspension Commonly known as: Carafate Take 10 mLs (1 g total) by mouth 4 (four) times daily -  with meals and at bedtime.   TRALEMENT IV Inject 1 mL into the vein. Used in hydration bag 4 times weekly   vitamin B-12 100 MCG tablet Commonly known as: CYANOCOBALAMIN Take 1,000 mcg by mouth daily.       Allergies  Allergen Reactions  . Meperidine Hives    Other reaction(s): GI Upset Due to Chrones   . Hyoscyamine Hives and Swelling    Legs swelling   Disorientation  .  Cefepime Other (See Comments)    Neurotoxicity occurring in setting of AKI. Ceftriaxone tolerated during same admit  . Gabapentin Other (See Comments)    unknown  . Lyrica [Pregabalin] Other (See Comments)    unknown  . Topamax [Topiramate] Other (See Comments)    unknown  . Zosyn [Piperacillin Sod-Tazobactam So]     Patient reports it makes her vomit, her neck stiff, and her "heart feel funny"  . Fentanyl Rash    Pt is allergic to fentanyl patch related to the glue (gives her a rash) Pt states she is NOT allergic to fentanyl IV medicine  . Morphine And Related Rash    Consultations:  None   Procedures/Studies: CT ABDOMEN PELVIS W CONTRAST  Result Date: 11/11/2020 CLINICAL DATA:  Unspecified abdominal pain, nausea EXAM: CT ABDOMEN AND PELVIS WITH CONTRAST TECHNIQUE: Multidetector CT imaging of the abdomen and pelvis was performed using the standard protocol following bolus administration of intravenous contrast. CONTRAST:  133m OMNIPAQUE IOHEXOL 300 MG/ML  SOLN COMPARISON:   10/22/2019 FINDINGS: Lower chest: No acute abnormality. Hepatobiliary: Gallbladder absent. Extensive pneumobilia is again seen likely reflecting changes of prior sphincterotomy. No enhancing liver lesion identified. Mild intra and extrahepatic biliary ductal dilation is present, stable when compared to prior examination. Pancreas: Pancreatic ductal dilation is again seen throughout the pancreas extending to the ampulla which is stable since prior examination. Preservation of pancreatic parenchyma. No enhancing pancreatic mass identified. Spleen: Unremarkable Adrenals/Urinary Tract: The adrenal glands are unremarkable. 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 Bladder unremarkable. Stomach/Bowel: The stomach is unremarkable. Multiple surgical staple lines are seen in keeping with partial small bowel and partial large bowel resections. There is notably relatively little small-bowel remaining within the abdomen. The residual large bowel within the left abdomen and pelvis appears largely fluid-filled, nonspecific in the setting of possible short gut syndrome as result of extensive small bowel resection. No free intraperitoneal gas or fluid. No loculated intra-abdominal fluid collections are identified. No evidence of obstruction. Vascular/Lymphatic: Right common femoral central venous catheter tip is seen at the hepatocaval junction. The abdominal vasculature is otherwise unremarkable. Reproductive: Status post hysterectomy. No adnexal masses. Other: No body wall hernia identified.  Rectum unremarkable. Musculoskeletal: Degenerative changes are seen within the lumbar spine. No lytic or blastic bone lesions. IMPRESSION: Status post cholecystectomy. Pneumobilia likely relates to prior sphincterotomy. Stable dilation of the biliary tree and pancreatic duct. This may relate to a central obstructing process, such as ampullary stenosis, but appears unchanged from prior  examination. Correlation with liver enzymes may be helpful to assess for obstructive process. Extensive bowel resection with relatively little small bowel remaining. Fluid-filled residual large bowel is nonspecific in the setting of possible short gut syndrome secondary to extensive small bowel resection. No evidence of obstruction or focal inflammation. Electronically Signed   By: AFidela SalisburyMD   On: 11/11/2020 01:53   DG Chest Port 1 View  Result Date: 12/05/2020 CLINICAL DATA:  Fever EXAM: PORTABLE CHEST 1 VIEW COMPARISON:  11/07/2020 FINDINGS: Mild cardiomegaly. No confluent opacities or effusions. No acute bony abnormality. IMPRESSION: No active disease. Electronically Signed   By: KRolm BaptiseM.D.   On: 12/05/2020 23:12   VAS UKoreaLOWER EXTREMITY VENOUS (DVT)  Result Date: 11/08/2020  Lower Venous DVT Study Indications: Elevated Ddimer.  Comparison Study: No prior studies. Performing Technologist: GOliver HumRVT  Examination Guidelines: A complete evaluation includes B-mode imaging, spectral Doppler, color Doppler, and power Doppler as needed  of all accessible portions of each vessel. Bilateral testing is considered an integral part of a complete examination. Limited examinations for reoccurring indications may be performed as noted. The reflux portion of the exam is performed with the patient in reverse Trendelenburg.  +---------+---------------+---------+-----------+----------+--------------+ RIGHT    CompressibilityPhasicitySpontaneityPropertiesThrombus Aging +---------+---------------+---------+-----------+----------+--------------+ CFV      Full           Yes      Yes                                 +---------+---------------+---------+-----------+----------+--------------+ SFJ      Full                                                        +---------+---------------+---------+-----------+----------+--------------+ FV Prox  Full                                                         +---------+---------------+---------+-----------+----------+--------------+ FV Mid   Full                                                        +---------+---------------+---------+-----------+----------+--------------+ FV DistalFull                                                        +---------+---------------+---------+-----------+----------+--------------+ PFV      Full                                                        +---------+---------------+---------+-----------+----------+--------------+ POP      Full           Yes      Yes                                 +---------+---------------+---------+-----------+----------+--------------+ PTV      Full                                                        +---------+---------------+---------+-----------+----------+--------------+ PERO     Full                                                        +---------+---------------+---------+-----------+----------+--------------+   +----+---------------+---------+-----------+----------+--------------+ LEFTCompressibilityPhasicitySpontaneityPropertiesThrombus  Aging +----+---------------+---------+-----------+----------+--------------+ CFV Full           Yes      Yes                                 +----+---------------+---------+-----------+----------+--------------+     Summary: RIGHT: - There is no evidence of deep vein thrombosis in the lower extremity.  - No cystic structure found in the popliteal fossa.  LEFT: - No evidence of common femoral vein obstruction.  *See table(s) above for measurements and observations. Electronically signed by Deitra Mayo MD on 11/08/2020 at 5:48:21 PM.    Final      Subjective: No new complaints  Discharge Exam: Vitals:   12/08/20 0023 12/08/20 0436  BP: (!) 177/84 (!) 178/80  Pulse: (!) 55 (!) 52  Resp: 18 18  Temp: 97.7 F (36.5 C) 98.1 F (36.7 C)  SpO2: 96% 100%   Vitals:    12/07/20 1554 12/07/20 1954 12/08/20 0023 12/08/20 0436  BP: (!) 154/88 (!) 182/76 (!) 177/84 (!) 178/80  Pulse: 66 79 (!) 55 (!) 52  Resp: _0 Temp: 98.3 F (36.8 C) 98.4 F (36.9 C) 97.7 F (36.5 C) 98.1 F (36.7 C)  TempSrc: Oral Oral Oral Oral  SpO2: 100% 98% 96% 100%    General: Pt is alert, awake, not in acute distress Cardiovascular: RRR, S1/S2 +, no rubs, no gallops Respiratory: CTA bilaterally, no wheezing, no rhonchi Abdominal: Soft, NT, ND, bowel sounds + Extremities: no edema, no cyanosis    The results of significant diagnostics from this hospitalization (including imaging, microbiology, ancillary and laboratory) are listed below for reference.     Microbiology: Recent Results (from the past 240 hour(s))  Blood culture (routine x 2)     Status: None (Preliminary result)   Collection Time: 12/05/20 12:01 AM   Specimen: BLOOD LEFT HAND  Result Value Ref Range Status   Specimen Description   Final    BLOOD LEFT HAND Performed at [facility], [place] [address], [place], [place] [address]    Special Requests   Final    BOTTLES DRAWN AEROBIC AND ANAEROBIC Blood Culture adequate volume Performed at [facility] [address] [place], [place] [address]    Culture   Final    NO GROWTH 2 DAYS Performed at [place] [address] [place], [place] [address]    Report Status PENDING  Incomplete  Urine culture     Status: Abnormal   Collection Time: 12/05/20 10:54 PM   Specimen: Urine, Random  Result Value Ref Range Status   Specimen Description   Final    URINE, RANDOM Performed at [facility] [address], [place], [place] [address]    Special Requests   Final    NONE Performed at [facility], [place] [address], [place], [place] [address]    Culture MULTIPLE SPECIES PRESENT, SUGGEST RECOLLECTION (A)  Final   Report Status 12/07/2020 FINAL  Final  Resp Panel  by RT-PCR (Flu A&B, Covid) Nasopharyngeal Swab     Status: Abnormal   Collection Time: 12/05/20 11:39 PM   Specimen: Nasopharyngeal Swab; Nasopharyngeal(NP) swabs in vial transport medium  Result Value Ref Range Status   SARS Coronavirus 2 by RT PCR POSITIVE (A) NEGATIVE Final    Comment: RESULT CALLED TO, READ BACK BY AND VERIFIED WITH: PARKER C. 01.11.22 @  0052 BY MECAIL J. (NOTE) SARS-CoV-2 target nucleic acids are DETECTED.  The SARS-CoV-2 RNA is generally detectable in upper respiratory specimens during the acute phase of infection. Positive results are indicative of the presence of the identified virus, but do not rule out bacterial infection or co-infection with other pathogens not detected by the test. Clinical correlation with patient history and other diagnostic information is necessary to determine patient infection status. The expected result is Negative.  Fact Sheet for Patients: EntrepreneurPulse.com.au  Fact Sheet for Healthcare Providers: IncredibleEmployment.be  This test is not yet approved or cleared by the [place] FDA and  has been authorized for detection and/or diagnosis of SARS-CoV-2 by FDA under an Emergency Use Authorization (EUA).  This EUA will remain in effect (meaning this test c an be used) for the duration of  the COVID-19 declaration under Section 564(b)(1) of the Act, 21 U.S.C. section 360bbb-3(b)(1), unless the authorization is terminated or revoked sooner.     Influenza A by PCR NEGATIVE NEGATIVE Final   Influenza B by PCR NEGATIVE NEGATIVE Final    Comment: (NOTE) The Xpert Xpress SARS-CoV-2/FLU/RSV plus assay is intended as an aid in the diagnosis of influenza from Nasopharyngeal swab specimens and should not be used as a sole basis for treatment. Nasal washings and aspirates are unacceptable for Xpert Xpress SARS-CoV-2/FLU/RSV testing.  Fact Sheet for  Patients: EntrepreneurPulse.com.au  Fact Sheet for Healthcare Providers: IncredibleEmployment.be  This test is not yet approved or cleared by the [place] FDA and has been authorized for detection and/or diagnosis of SARS-CoV-2 by FDA under an Emergency Use Authorization (EUA). This EUA will remain in effect (meaning this test can be used) for the duration of the COVID-19 declaration under Section 564(b)(1) of the Act, 21 U.S.C. section 360bbb-3(b)(1), unless the authorization is terminated or revoked.  Performed at [facility], [place] [address] [place], [place] [address]   Blood culture (routine x 2)     Status: None (Preliminary result)   Collection Time: 12/05/20 11:40 PM   Specimen: BLOOD RIGHT FOREARM  Result Value Ref Range Status   Specimen Description   Final    BLOOD RIGHT FOREARM Performed at [facility] [address] [place], [place] [address]    Special Requests   Final    BOTTLES DRAWN AEROBIC AND ANAEROBIC Blood Culture adequate volume Performed at [facility] [address] [place], [place] [address]    Culture   Final    NO GROWTH 2 DAYS Performed at [facility] [address], [place], [place] [address]    Report Status PENDING  Incomplete     Labs: BNP (last 3 results) No results for input(s): BNP in the last 8760 hours. Basic Metabolic Panel: Recent Labs  Lab 12/05/20 2339 12/06/20 0346 12/07/20 0609  NA 136  --  139  K 4.0  --  3.5  CL 103  --  103  CO2 24  --  23  GLUCOSE 131*  --  88  BUN 27*  --  19  CREATININE 1.30* 1.29* 1.22*  CALCIUM 8.8*  --  9.2  MG  --   --  1.7  PHOS  --   --  2.6   Liver Function Tests: Recent Labs  Lab 12/05/20 2339 12/07/20 0609  AST 32 29  ALT 32 33  ALKPHOS 163* 140*  BILITOT 0.6 0.4  PROT 7.1 6.7  ALBUMIN 3.4* 3.1*   No results for  input(s): LIPASE, AMYLASE in the last 168 hours. No  results for input(s): AMMONIA in the last 168 hours. CBC: Recent Labs  Lab 12/05/20 2339 12/06/20 0346 12/07/20 0609 12/08/20 0759  WBC 2.9* 2.1* 3.3* 2.2*  NEUTROABS 1.9  --  1.4* 0.6*  HGB 9.2* 8.8* 9.7* 9.6*  HCT 27.7* 26.4* 29.2* 29.3*  MCV 91.7 92.0 90.7 94.2  PLT 139* 128* 156 145*   Cardiac Enzymes: No results for input(s): CKTOTAL, CKMB, CKMBINDEX, TROPONINI in the last 168 hours. BNP: Invalid input(s): POCBNP CBG: Recent Labs  Lab 12/07/20 1806 12/08/20 0034 12/08/20 0438  GLUCAP 102* 122* 122*   D-Dimer No results for input(s): DDIMER in the last 72 hours. Hgb A1c No results for input(s): HGBA1C in the last 72 hours. Lipid Profile Recent Labs    12/07/20 0609  TRIG 129   Thyroid function studies No results for input(s): TSH, T4TOTAL, T3FREE, THYROIDAB in the last 72 hours.  Invalid input(s): FREET3 Anemia work up No results for input(s): VITAMINB12, FOLATE, FERRITIN, TIBC, IRON, RETICCTPCT in the last 72 hours. Urinalysis    Component Value Date/Time   COLORURINE YELLOW 12/05/2020 2254   APPEARANCEUR CLEAR 12/05/2020 2254   LABSPEC 1.013 12/05/2020 2254   PHURINE 6.0 12/05/2020 2254   GLUCOSEU NEGATIVE 12/05/2020 2254   HGBUR NEGATIVE 12/05/2020 2254   BILIRUBINUR NEGATIVE 12/05/2020 2254   KETONESUR NEGATIVE 12/05/2020 2254   PROTEINUR 100 (A) 12/05/2020 2254   NITRITE NEGATIVE 12/05/2020 2254   LEUKOCYTESUR NEGATIVE 12/05/2020 2254   Sepsis Labs Invalid input(s): PROCALCITONIN,  WBC,  LACTICIDVEN Microbiology Recent Results (from the past 240 hour(s))  Blood culture (routine x 2)     Status: None (Preliminary result)   Collection Time: 12/05/20 12:01 AM   Specimen: BLOOD LEFT HAND  Result Value Ref Range Status   Specimen Description   Final    BLOOD LEFT HAND Performed at [facility], [place] [address] [place], [place] [address]    Special Requests   Final    BOTTLES DRAWN AEROBIC AND ANAEROBIC Blood Culture  adequate volume Performed at [place] [address] [place], [place] [address]    Culture   Final    NO GROWTH 2 DAYS Performed at [facility] [address], [place], [place] [address]    Report Status PENDING  Incomplete  Urine culture     Status: Abnormal   Collection Time: 12/05/20 10:54 PM   Specimen: Urine, Random  Result Value Ref Range Status   Specimen Description   Final    URINE, RANDOM Performed at [facility] [address], [place], [place] [address]    Special Requests   Final    NONE Performed at [facility], [place] [address], [place], [place] [address]    Culture MULTIPLE SPECIES PRESENT, SUGGEST RECOLLECTION (A)  Final   Report Status 12/07/2020 FINAL  Final  Resp Panel by RT-PCR (Flu A&B, Covid) Nasopharyngeal Swab     Status: Abnormal   Collection Time: 12/05/20 11:39 PM   Specimen: Nasopharyngeal Swab; Nasopharyngeal(NP) swabs in vial transport medium  Result Value Ref Range Status   SARS Coronavirus 2 by RT PCR POSITIVE (A) NEGATIVE Final    Comment: RESULT CALLED TO, READ BACK BY AND VERIFIED WITH: PARKER C. 01.11.22 @ 0052 BY MECAIL J. (NOTE) SARS-CoV-2 target nucleic acids are DETECTED.  The SARS-CoV-2 RNA is generally detectable in upper respiratory specimens during the acute phase of infection. Positive  results are indicative of the presence of the identified virus, but do not rule out bacterial infection or co-infection with other pathogens not detected by the test. Clinical correlation with patient history and other diagnostic information is necessary to determine patient infection status. The expected result is Negative.  Fact Sheet for Patients: EntrepreneurPulse.com.au  Fact Sheet for Healthcare Providers: IncredibleEmployment.be  This test is not yet approved or cleared by the [place] FDA and  has been authorized for  detection and/or diagnosis of SARS-CoV-2 by FDA under an Emergency Use Authorization (EUA).  This EUA will remain in effect (meaning this test c an be used) for the duration of  the COVID-19 declaration under Section 564(b)(1) of the Act, 21 U.S.C. section 360bbb-3(b)(1), unless the authorization is terminated or revoked sooner.     Influenza A by PCR NEGATIVE NEGATIVE Final   Influenza B by PCR NEGATIVE NEGATIVE Final    Comment: (NOTE) The Xpert Xpress SARS-CoV-2/FLU/RSV plus assay is intended as an aid in the diagnosis of influenza from Nasopharyngeal swab specimens and should not be used as a sole basis for treatment. Nasal washings and aspirates are unacceptable for Xpert Xpress SARS-CoV-2/FLU/RSV testing.  Fact Sheet for Patients: EntrepreneurPulse.com.au  Fact Sheet for Healthcare Providers: IncredibleEmployment.be  This test is not yet approved or cleared by the [place] FDA and has been authorized for detection and/or diagnosis of SARS-CoV-2 by FDA under an Emergency Use Authorization (EUA). This EUA will remain in effect (meaning this test can be used) for the duration of the COVID-19 declaration under Section 564(b)(1) of the Act, 21 U.S.C. section 360bbb-3(b)(1), unless the authorization is terminated or revoked.  Performed at [facility], [place] [address] [place], [place] [address]   Blood culture (routine x 2)     Status: None (Preliminary result)   Collection Time: 12/05/20 11:40 PM   Specimen: BLOOD RIGHT FOREARM  Result Value Ref Range Status   Specimen Description   Final    BLOOD RIGHT FOREARM Performed at [facility] [address] [place], [place] [address]    Special Requests   Final    BOTTLES DRAWN AEROBIC AND ANAEROBIC Blood Culture adequate volume Performed at [facility] [address], [place], [place] [address]    Culture   Final    NO  GROWTH 2 DAYS Performed at [facility] [address] [place], [place] [address]    Report Status PENDING  Incomplete     Time coordinating discharge: Over 40 minutes  SIGNED:   Charlynne Cousins, MD  [facility] 12/08/2020, 10:07 AM Pager   If 7PM-7AM, please contact night-coverage [email or web address] Password [email or web address]

## 2020-12-08 NOTE — Telephone Encounter (Signed)
Transition Care Management Follow-up Telephone Call  Date of discharge and from where: [facility] on 12/08/2020  How have you been since you were released from the hospital? Patient states she is doing well has been feeling well just weak   Any questions or concerns? No  Items Reviewed:  Did the pt receive and understand the discharge instructions provided? Yes   Medications obtained and verified? Yes   Other? No   Any new allergies since your discharge? No   Dietary orders reviewed? Yes  Do you have support at home? Yes   Home Care and Equipment/Supplies: Were home health services ordered? no If so, what is the name of the agency? N/A   Has the agency set up a time to come to the patient's home? not applicable Were any new equipment or medical supplies ordered?  No What is the name of the medical supply agency? No  Were you able to get the supplies/equipment? no Do you have any questions related to the use of the equipment or supplies? No  Functional Questionnaire: (I = Independent and D = Dependent) ADLs: I  Bathing/Dressing- I  Meal Prep- D  Eating- I  Maintaining continence- I  Transferring/Ambulation- I  Managing Meds- I  Follow up appointments reviewed:   PCP Hospital f/u appt confirmed? Yes  Scheduled to see Dr. [name] on 12/28/2020 @ 11:00 am .  [facility] f/u appt confirmed? No    Are transportation arrangements needed? No   If their condition worsens, is the pt aware to call PCP or go to the Emergency Dept.? Yes  Was the patient provided with contact information for the PCP's office or ED? Yes  Was to pt encouraged to call back with questions or concerns? Yes ,

## 2020-12-08 NOTE — Progress Notes (Signed)
PHARMACY - TOTAL PARENTERAL NUTRITION CONSULT NOTE   Indication: Short bowel syndrome  Patient Measurements:     There is no height or weight on file to calculate BMI. 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  Admit with Covid PNA, need to r/o line infection, IV team inspected central line - no sign infection. Can resume TPN.  Will order continuous TPN since patient still in ED, possible discharge home if PCT neg and afebrile.  Glucose / Insulin: serum glucose 88 this am Electrolytes: wnl Renal: SCr 1.22 LFTs / TGs: TG 129,  Prealbumin / albumin: Pre-Alb 19.4  Intake / Output; MIVF: NS at 75 ml/hr GI Imaging: Surgeries / Procedures:   Central access: chronic PICC TPN start date: resumed 1/12  Nutritional Goals:  Goal TPN rate 75 mL/hr (provides 85 g of protein and ~ 1400 kcals per day)  Current Nutrition: NPO  Plan:  Discharge home 1/13, TPN labs still in process. No TPN bag to be mixed today for inpatient administration.  Start TPN at 45m/hr at 1800, continuous rate Electrolytes in TPN: 554m/L of Na, 5044mL of K, 5mE42m of Ca, 5mEq54mof Mg, and 15mmo65mof Phos. Cl:Ac ratio 1:1 Add standard MVI and trace elements to TPN Initiate Sensitive scale SSI q6h and adjust as needed  Reduce MIVF to 25 mL/hr at 1800, note IV fluid ordered to end 1/13  Monitor TPN labs on Mon/Thurs if pt not discharged 1/13, and would adjust TPN to cyclic rate (based on home regimen) if admitted to floor bed  Rondell Pardon,Minda DittoD 12/08/2020,10:57 AM

## 2020-12-08 NOTE — Addendum Note (Signed)
Addended by: Westley Hummer B on: 12/08/2020 03:55 PM   Modules accepted: Orders

## 2020-12-08 NOTE — TOC Transition Note (Signed)
Transition of Care [facility]) - CM/SW Discharge Note   Patient Details  Name: Caitlin Peters MRN: [id number] Date of Birth: August 26, 1961  Transition of Care [facility]) CM/SW Contact:  Trish Mage, LCSW Phone Number: 12/08/2020, 11:52 AM   Clinical Narrative:   Patient  Scheduled for d/c today has [facility] for services with TPN.  MD order seen and appreciated.  Contacted Cindie with [facility] to let her know or patient d/c today.  No further needs identifed.  TOC sign off.    Final next level of care: [place] Barriers to Discharge: No Barriers Identified   Patient Goals and CMS Choice        Discharge Placement                       Discharge Plan and Services                                     Social Determinants of Health (SDOH) Interventions     Readmission Risk Interventions Readmission Risk Prevention Plan 11/09/2020 09/09/2020 08/05/2020  Transportation Screening Complete Complete Complete  PCP or Specialist Appt within 3-5 Days - - -  Not Complete comments - - -  HRI or [place] Work Consult for [place] Planning/Counseling - - -  SW consult not completed comments - - -  Palliative Care Screening - - -  Medication Review Press photographer) Complete Complete Complete  PCP or Specialist appointment within 3-5 days of discharge Complete Complete Complete  HRI or Home Care Consult Complete Complete Complete  SW Recovery Care/Counseling Consult - Complete Complete  Palliative Care Screening Not Applicable Not Applicable Not [place] Not Applicable Not Applicable Not Applicable

## 2020-12-11 ENCOUNTER — Other Ambulatory Visit: Payer: Self-pay

## 2020-12-11 ENCOUNTER — Encounter (HOSPITAL_COMMUNITY): Payer: Self-pay | Admitting: Obstetrics and Gynecology

## 2020-12-11 ENCOUNTER — Emergency Department (HOSPITAL_COMMUNITY)
Admission: EM | Admit: 2020-12-11 | Discharge: 2020-12-11 | Disposition: A | Discharge status: Home / Self Care | Payer: Medicare Other | Attending: Emergency Medicine | Admitting: Emergency Medicine

## 2020-12-11 DIAGNOSIS — Z87891 Personal history of nicotine dependence: Secondary | ICD-10-CM | POA: Insufficient documentation

## 2020-12-11 DIAGNOSIS — G8929 Other chronic pain: Secondary | ICD-10-CM | POA: Insufficient documentation

## 2020-12-11 DIAGNOSIS — Z8616 Personal history of COVID-19: Secondary | ICD-10-CM | POA: Insufficient documentation

## 2020-12-11 DIAGNOSIS — R109 Unspecified abdominal pain: Secondary | ICD-10-CM | POA: Insufficient documentation

## 2020-12-11 DIAGNOSIS — Z79899 Other long term (current) drug therapy: Secondary | ICD-10-CM | POA: Insufficient documentation

## 2020-12-11 DIAGNOSIS — I129 Hypertensive chronic kidney disease with stage 1 through stage 4 chronic kidney disease, or unspecified chronic kidney disease: Secondary | ICD-10-CM | POA: Insufficient documentation

## 2020-12-11 DIAGNOSIS — R531 Weakness: Secondary | ICD-10-CM | POA: Diagnosis present

## 2020-12-11 DIAGNOSIS — K219 Gastro-esophageal reflux disease without esophagitis: Secondary | ICD-10-CM | POA: Diagnosis not present

## 2020-12-11 DIAGNOSIS — N183 Chronic kidney disease, stage 3 unspecified: Secondary | ICD-10-CM | POA: Diagnosis not present

## 2020-12-11 DIAGNOSIS — U071 COVID-19: Secondary | ICD-10-CM | POA: Diagnosis not present

## 2020-12-11 DIAGNOSIS — R5383 Other fatigue: Secondary | ICD-10-CM

## 2020-12-11 LAB — CULTURE, BLOOD (ROUTINE X 2)
Culture: NO GROWTH
Culture: NO GROWTH
Special Requests: ADEQUATE
Special Requests: ADEQUATE

## 2020-12-11 LAB — CBG MONITORING, ED: Glucose-Capillary: 82 mg/dL (ref 70–99)

## 2020-12-11 MED ORDER — SODIUM CHLORIDE 0.9 % IV BOLUS
500.0000 mL | Freq: Once | INTRAVENOUS | Status: AC
  Administered 2020-12-11: 500 mL via INTRAVENOUS

## 2020-12-11 MED ORDER — HYDROMORPHONE HCL 2 MG PO TABS
2.0000 mg | ORAL_TABLET | Freq: Once | ORAL | Status: DC
  Filled 2020-12-11: qty 1

## 2020-12-11 MED ORDER — HYDROMORPHONE HCL 2 MG PO TABS
2.0000 mg | ORAL_TABLET | Freq: Once | ORAL | Status: AC
  Administered 2020-12-11: 2 mg via ORAL
  Filled 2020-12-11: qty 1

## 2020-12-11 MED ORDER — ONDANSETRON 4 MG PO TBDP
4.0000 mg | ORAL_TABLET | Freq: Once | ORAL | Status: AC
  Administered 2020-12-11: 4 mg via ORAL
  Filled 2020-12-11: qty 1

## 2020-12-11 MED ORDER — HYDROMORPHONE HCL 2 MG/ML IJ SOLN
2.0000 mg | Freq: Once | INTRAMUSCULAR | Status: AC
Start: 2020-12-11 — End: 2020-12-11
  Administered 2020-12-11: 2 mg via INTRAVENOUS
  Filled 2020-12-11: qty 1

## 2020-12-11 NOTE — ED Notes (Signed)
Another staff member is attempting to draw labs. Phlebotomy was contacted to come draw labs, but they will not be coming to help the department.

## 2020-12-11 NOTE — ED Triage Notes (Signed)
Patient was recently released from the hospital. Patient has multiple complaints related to weakness and COVID. Patient is alert and oriented x4.

## 2020-12-11 NOTE — ED Notes (Addendum)
I drew labs on this patient and it wasn't sufficient to be tested by lab. Two nurses have unsuccessfully attempted to draw from the PICC line in her right femoral vein. Ultrasound guided IV was also unsuccessfully attempted.

## 2020-12-11 NOTE — ED Notes (Signed)
Patient requesting lab draw from left groin Picc line.

## 2020-12-11 NOTE — ED Provider Notes (Signed)
[facility] DEPT Provider Note   CSN: [id number] Arrival date & time: 12/11/20  1409     History Chief Complaint  Patient presents with  . Covid Positive    Caitlin Peters is a 60 y.o. female.  HPI   Pt as history of pancreatitis, CKD,crohns disease, chronic pain syndrome, chronic TPN, frequent PICC line infections.  Recently in the hospital for covid 19.  Pt discharged on 1/13 .  Patient states since leaving the hospital she initially did fine but in the last couple of days she has had increased generalized weakness and myalgias.  She continues to have diarrhea.  Patient states she has chronic abdominal pain issues associated with her short gut syndrome.  She denies any fevers.  She is not feeling short of breath.  Patient does ask for pain medications.  She states usually when she is in the emergency room she gets Dilaudid IV  Past Medical History:  Diagnosis Date  . Acute pancreatitis 04/13/2020  . Anasarca 10/2019  . AVN (avascular necrosis of bone) ([place])   . Cataract   . Choledocholithiasis (sludge) s/p ERCP 10/2019 10/21/2020  . Chronic pain syndrome   . CKD (chronic kidney disease), stage III ([place])   . Crohn disease ([place])   . Crohn's disease of small & large intestine with SGS 1984   Caitlin Peters is a 60year old female with a history of Crohn's disease diagnosed in 62 (age 80), history of long-term steroid use with osteoporosis, S/P multiple bowel resections ([id number]) complicated by chronic back and abdominal pain, steatorrhea and short bowel syndrome. The patient has been left with ~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 attached to proximal transverse colon through rectum. She has been  . Depression   . Diverticulosis   . GERD (gastroesophageal reflux disease)   . HTN (hypertension)   . IDA (iron deficiency anemia)   . Malnutrition ([place])   . Mass in chest   . Osteoporosis   . Pancreatitis   . SGS (short gut syndrome) from intestinal resections for  Crohns Disease 07/15/2014    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; 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; jejunal to transverse colon anastomosis Treated at [facility] SB lengthening to 165cm Dr Alene Mires, [facility]  . Vitamin B12 deficiency     Patient Active Problem List   Diagnosis Date Noted  . CKD (chronic kidney disease) stage 3, GFR 30-59 ml/min ([name]) 12/06/2020  . COVID-19 virus infection 12/06/2020  . Protein calorie malnutrition ([place]) 12/06/2020  . Palpitations 11/22/2020  . PAC (premature atrial contraction) 11/22/2020  . History of central line-associated bloodstream infection (CLABSI) 10/21/2020  . Nausea & vomiting 10/21/2020  . Choledocholithiasis (sludge) s/p ERCP 10/2019 10/21/2020  . 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 Abdominal pain 09/07/2020  . Acute metabolic encephalopathy   . Hypomagnesemia   . Partial small bowel obstruction ([place])   . Bacteremia associated with intravascular line ([place]) 08/04/2020  . Enterobacter sepsis ([place]) 07/22/2020  . Anxiety 06/03/2020  . Acute pancreatitis 04/13/2020  . Infection due to Acinetobacter species 03/10/2020  . Pancytopenia ([place]) 03/05/2020  . Central line infection   . Elevated liver enzymes 01/02/2020  . Cholangitis   . Anasarca 10/28/2019  . Acute kidney injury superimposed on chronic kidney disease ([place]) 10/28/2019  . Falls 10/28/2019  . Malnutrition ([place])   . Vitamin B12 deficiency   . GERD (gastroesophageal reflux disease)   . Chronic pain syndrome   . Hypokalemia  due to excessive gastrointestinal loss of potassium 10/13/2019  . Fever   . Hypokalemia 10/12/2019  . Chronic diarrhea 10/12/2019  . Dysuria 10/12/2019  . Bilateral lower extremity edema 10/12/2019  . AKI (acute kidney injury) ([place]) 10/12/2019  . Fungemia 08/27/2019  . IDA (iron deficiency anemia) 11/03/2018  . Dilation of biliary tract 08/28/2018  . Severe diarrhea 03/07/2018  . LFTs abnormal 01/27/2018  . GI tract  obstruction ([place]) 01/27/2018  . Gram-negative bacteremia 09/13/2017  . HTN (hypertension), benign 12/02/2016  . Intractable pain 12/02/2016  . Anemia 12/02/2016  . 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 Avascular necrosis ([place]) 06/14/2015  . Polyarthralgia 05/03/2015  . On total parenteral nutrition (TPN) 12/30/2014  . Osteoporosis 12/24/2014  . Low back pain 12/16/2014  . Vitamin D deficiency 12/16/2014  . 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 History of colonic diverticulitis 2014  . Depression 07/24/2012  . Gram-positive bacteremia 07/24/2012  . Small bowel obstruction due to adhesions ([place]) 07/24/2012  . Diarrhea 12/13/2011  . Neuralgia and neuritis 06/01/2011  . Myalgia and myositis 08/12/2003  . Crohn's disease of small & large intestine with SGS 1984    Past Surgical History:  Procedure Laterality Date  . ABDOMINAL ADHESION SURGERY  01/22/2018  . APPENDECTOMY  1989  . BILIARY DILATION  11/26/2019   Procedure: BILIARY DILATION;  Surgeon: Jackquline Denmark, MD;  Location: [facility];  Service: Endoscopy;;  . BILIARY DILATION  03/08/2020   Procedure: BILIARY DILATION;  Surgeon: Irving Copas., MD;  Location: [facility];  Service: Gastroenterology;;  . BIOPSY  03/08/2020   Procedure: BIOPSY;  Surgeon: Irving Copas., MD;  Location: [facility];  Service: Gastroenterology;;  . CHEST WALL RESECTION     right thoracotomy,resection of chest mass with anterior rib and reconstruction using prosthetic mesh and video arthroscopy  . CHOLECYSTECTOMY  01/22/2018  . COLONOSCOPY  2019  . ENTEROSTOMY CLOSURE  04/1999  . ERCP N/A 11/26/2019   Procedure: ENDOSCOPIC RETROGRADE CHOLANGIOPANCREATOGRAPHY (ERCP);  Surgeon: Jackquline Denmark, MD;  Location: [facility];  Service: Endoscopy;  Laterality: N/A;  . ERCP N/A 03/08/2020   Procedure: ENDOSCOPIC RETROGRADE CHOLANGIOPANCREATOGRAPHY (ERCP);  Surgeon: Irving Copas., MD;  Location: [facility];  Service: Gastroenterology;   Laterality: N/A;  . ESOPHAGOGASTRODUODENOSCOPY N/A 03/08/2020   Procedure: ESOPHAGOGASTRODUODENOSCOPY (EGD);  Surgeon: Irving Copas., MD;  Location: [facility];  Service: Gastroenterology;  Laterality: N/A;  . EUS N/A 03/08/2020   Procedure: UPPER ENDOSCOPIC ULTRASOUND (EUS) LINEAR;  Surgeon: Irving Copas., MD;  Location: [facility];  Service: Gastroenterology;  Laterality: N/A;  . ILEOCECETOMY  03/1999   ileocolon resection with abdominal stoma  . ILEOSTOMY CLOSURE  2001  . IR FLUORO GUIDE CV LINE LEFT  01/07/2020  . IR FLUORO GUIDE CV LINE LEFT  03/09/2020  . IR FLUORO GUIDE CV LINE LEFT  05/09/2020  . IR FLUORO GUIDE CV LINE LEFT  07/20/2020  . IR FLUORO GUIDE CV LINE RIGHT  08/05/2020  . IR PTA VENOUS EXCEPT DIALYSIS CIRCUIT  01/07/2020  . IR REMOVAL TUN CV CATH W/O FL  08/05/2020  . IR US GUIDE VASC ACCESS LEFT     x 2 06/17/19 and 09/14/2019  . IR US GUIDE VASC ACCESS RIGHT  08/05/2020  . KNEE SURGERY     right knee   . LAPAROSCOPIC SMALL BOWEL RESECTION  2009   2000-2009.  SB resections for Crohns Disease - now with Short gut  . OMENTECTOMY  01/22/2018  . PARTIAL HYSTERECTOMY  1984  with LSO  . REMOVAL OF STONES  11/26/2019   Procedure: REMOVAL OF STONES;  Surgeon: Jackquline Denmark, MD;  Location: [facility];  Service: Endoscopy;;  . REMOVAL OF STONES  03/08/2020   Procedure: REMOVAL OF STONES;  Surgeon: Irving Copas., MD;  Location: [facility];  Service: Gastroenterology;;  . SALPINGOOPHORECTOMY Left 1984  . SALPINGOOPHORECTOMY Right 1990  . SERIAL TRANSVERSE ENTEROPLASTY (STEP) - SMALL BOWEL LENGTHENING  01/22/2018   Dr Alene Mires, [place] length from 120 to 165cm   . SMALL INTESTINE SURGERY  2002  . SMALL INTESTINE SURGERY  2003  . SPHINCTEROTOMY  11/26/2019   Procedure: SPHINCTEROTOMY;  Surgeon: Jackquline Denmark, MD;  [facility];  Service: Endoscopy;;  . TOTAL ABDOMINAL HYSTERECTOMY  1990   with RSO  . UPPER  GASTROINTESTINAL ENDOSCOPY       OB History   No obstetric history on file.     Family History  Problem Relation Age of Onset  . Breast cancer Sister   . Multiple sclerosis Sister   . Diabetes Sister   . Lupus Sister   . Colon cancer Other   . Crohn's disease Other   . Seizures Mother   . Glaucoma Mother   . CAD Father   . Heart disease Father   . Hypertension Father     Social History   Tobacco Use  . Smoking status: Former Research scientist (life sciences)  . Smokeless tobacco: Never Used  Vaping Use  . Vaping Use: Never used  Substance Use Topics  . Alcohol use: Not Currently  . Drug use: Never    Home Medications Prior to Admission medications   Medication Sig Start Date End Date Taking? Authorizing Provider  acetaminophen (TYLENOL) 325 MG tablet Take 650 mg by mouth every 6 (six) hours as needed for mild pain.     [provider]  amLODipine (NORVASC) 10 MG tablet Take 1 tablet (10 mg total) by mouth daily. 12/07/20 01/06/21  [name], [name], MD  budesonide (ENTOCORT EC) 3 MG 24 hr capsule TAKE 3 CAPSULES BY MOUTH ONCE DAILY 04/05/20   [name], [name], MD  buPROPion [facility] SR) 100 MG 12 hr tablet Take 2 tablets (200 mg total) by mouth daily. 12/08/20   [name], [name], MD  Calcium 200 MG TABS Take 200 mg by mouth daily.    [provider]  cholecalciferol (VITAMIN D3) 25 MCG (1000 UT) tablet Take 1,000 Units by mouth daily.     [provider]  cycloSPORINE (RESTASIS) 0.05 % ophthalmic emulsion Place 1 drop into both eyes 2 (two) times daily.    [provider]  dexlansoprazole (DEXILANT) 60 MG capsule Take 1 capsule (60 mg total) by mouth daily. 06/28/20   [name], [name], MD  Dextran 70-Hypromellose 0.1-0.3 % SOLN Place 1 drop into both eyes 4 (four) times daily.    [provider]  diphenoxylate-atropine (LOMOTIL) 2.5-0.025 MG tablet TAKE 1 TABLET BY MOUTH 4 TIMES DAILY AS NEEDED FOR DIARRHEA OR  LOOSE   STOOLS Patient taking differently: Take 1 tablet by mouth 4 (four) times daily as needed for diarrhea or loose stools. 11/23/20   [name], [name], MD  DULoxetine (CYMBALTA) 30 MG capsule Take 3 capsules (90 mg total) by mouth daily. 12/07/20   [name], [name], MD  estradiol (ESTRACE) 2 MG tablet Take 1 tablet (2 mg total) by mouth daily. 12/07/20   [name], [name], MD  hydrALAZINE (  APRESOLINE) 50 MG tablet Take 1.5 tablets (75 mg total) by mouth 3 (three) times daily. 11/22/20 02/20/21  Rudean Haskell A, MD  HYDROmorphone (DILAUDID) 4 MG tablet Take 4 mg by mouth 4 (four) times daily as needed for moderate pain.  09/15/20   [provider]  lipase/protease/amylase (CREON) 36000 UNITS CPEP capsule Take 1 capsule (36,000 Units total) by mouth 3 (three) times daily as needed (with meals for digestion). 06/28/20   [name], [name], MD  loperamide (IMODIUM) 2 MG capsule Take 2 mg by mouth as needed for diarrhea or loose stools.    [provider]  methadone (DOLOPHINE) 5 MG tablet Take 1 tablet (5 mg total) by mouth 5 (five) times daily. 06/30/20   Florencia Reasons, MD  metoprolol tartrate (LOPRESSOR) 100 MG tablet Take 1 tablet (100 mg total) by mouth 2 (two) times daily. 12/07/20   [name], [name], MD  Multiple Vitamins-Minerals (MULTIVITAMIN ADULT PO) Take 1 tablet by mouth daily.    [provider]  NARCAN 4 MG/0.1ML LIQD nasal spray kit Place 1 spray into the nose once.  10/14/20   [provider]  PRESCRIPTION MEDICATION Inject 1 each into the vein daily. Home TPN . [facility] [place] . 1 bag for 12 hours. [phone]    [provider]  Probiotic Product (PROBIOTIC-10 PO) Take 1 capsule by mouth daily.     [provider]  promethazine (PHENERGAN) 25 MG tablet Take 1 tablet (25 mg total) by mouth every 6 (six) hours as needed for nausea or vomiting. 12/07/20   [name],  [name], MD  sodium chloride 0.9 % infusion Inject into the vein. 10/16/20   [provider]  sucralfate (CARAFATE) 1 GM/10ML suspension Take 10 mLs (1 g total) by mouth 4 (four) times daily -  with meals and at bedtime. 06/28/20   [name], [name], MD  Trace Minerals Cu-Mn-Se-Zn (TRALEMENT IV) Inject 1 mL into the vein. Used in hydration bag 4 times weekly    [provider]  vitamin B-12 (CYANOCOBALAMIN) 100 MCG tablet Take 1,000 mcg by mouth daily.     [provider]    Allergies    Meperidine, Hyoscyamine, Cefepime, Gabapentin, Lyrica [pregabalin], Topamax [topiramate], Zosyn [piperacillin sod-tazobactam so], Fentanyl, and Morphine and related  Review of Systems   Review of Systems  All other systems reviewed and are negative.   Physical Exam Updated Vital Signs BP (!) 158/71   Pulse 66   Temp 98.5 F (36.9 C) (Oral)   Resp 17   Ht 1.727 m (_0 )   Wt 61.2 kg   SpO2 100%   BMI 20.53 kg/m   Physical Exam Vitals and nursing note reviewed.  Constitutional:      General: She is not in acute distress.    Appearance: She is well-developed and well-nourished.  HENT:     Head: Normocephalic and atraumatic.     Right Ear: External ear normal.     Left Ear: External ear normal.  Eyes:     General: No scleral icterus.       Right eye: No discharge.        Left eye: No discharge.     Conjunctiva/sclera: Conjunctivae normal.  Neck:     Trachea: No tracheal deviation.  Cardiovascular:     Rate and Rhythm: Normal rate and regular rhythm.     Pulses: Intact distal pulses.  Pulmonary:  Effort: Pulmonary effort is normal. No respiratory distress.     Breath sounds: Normal breath sounds. No stridor. No wheezing or rales.  Abdominal:     General: Bowel sounds are normal. There is no distension.     Palpations: Abdomen is soft.     Tenderness: There is abdominal tenderness. There is no guarding or rebound.  Musculoskeletal:         General: No tenderness or edema.     Cervical back: Neck supple.  Skin:    General: Skin is warm and dry.     Findings: No rash.  Neurological:     Mental Status: She is alert.     Cranial Nerves: No cranial nerve deficit (no facial droop, extraocular movements intact, no slurred speech).     Sensory: No sensory deficit.     Motor: No abnormal muscle tone or seizure activity.     Coordination: Coordination normal.     Deep Tendon Reflexes: Strength normal.  Psychiatric:        Mood and Affect: Mood and affect normal.     ED Results / Procedures / Treatments   Labs (all labs ordered are listed, but only abnormal results are displayed) Labs Reviewed  BASIC METABOLIC PANEL  CBC  CBG MONITORING, ED    EKG EKG Interpretation  Date/Time:  Sunday December 11 2020 14:42:32 EST Ventricular Rate:  71 PR Interval:    QRS Duration: 77 QT Interval:  413 QTC Calculation: 449 R Axis:   73 Text Interpretation: Sinus rhythm Atrial premature complexes RSR' in V1 or V2, right VCD or RVH Probable LVH with secondary repol abnrm 12 Lead; [place] Since last tracing rate slower Confirmed by Dorie Rank [phone]) on 12/11/2020 8:45:59 PM   Radiology No results found.  Procedures Procedures (including critical care time)  Medications Ordered in ED Medications  sodium chloride 0.9 % bolus 500 mL (0 mLs Intravenous Stopped 12/11/20 2130)  HYDROmorphone (DILAUDID) tablet 2 mg (2 mg Oral Given 12/11/20 2026)  ondansetron (ZOFRAN-ODT) disintegrating tablet 4 mg (4 mg Oral Given 12/11/20 2026)  HYDROmorphone (DILAUDID) injection 2 mg (2 mg Intravenous Given 12/11/20 2229)    ED Course  I have reviewed the triage vital signs and the nursing notes.  Pertinent labs & imaging results that were available during my care of the patient were reviewed by me and considered in my medical decision making (see chart for details).  Clinical Course as of 12/11/20 2246  Nancy Fetter Dec 11, 2020  2226 Discussed pain  medication.  Patient does have chronic abdominal pain issues.  I initially offered her oral main pain medications.  Patient states that she needed IV pain medications when it gets this bad.  We did agree to giving her 1 dose of pain medication.  Patient otherwise appears comfortable right now.  Her pulse rate is normal.  Her blood pressure is normal.  We did have difficulty collecting her laboratory tests. [UY]  4034 I contacted laboratory.  They did not have enough sample to run her lab tests.  Patient had multiple sticks. [JK]    Clinical Course User Index [JK] Dorie Rank, MD   MDM Rules/Calculators/A&P                          Patient presented to the ED with complaints of fatigue associated with her recent COVID virus infection.  Recently in the hospital and I reviewed the notes associated with that visit.  Patient  was discharged 3 days ago.  Patient was in the hospital she had recurrent issues with complaints of pain.  There were concerns for potential opiate overuse.  Patient came here complaining of her persistent abdominal pain.  Patient indicated she needed IV Dilaudid her p.o. meds were not working.  Plan was to try and check a CBC and metabolic panel however after multiple attempts.  Was not able to get adequate blood sample.  Patient has remained stable in the ED.  She is not having any vomiting.  She appears comfortable.  Her vitals are normal.  I do not feel at this time is necessary to continue to try to obtain blood test.  Patient was given a dose of pain medication with her PICC line.  She is more comfortable.  Patient appears stable for discharge. Final Clinical Impression(s) / ED Diagnoses Final diagnoses:  Fatigue, unspecified type  COVID-19 virus infection  Chronic abdominal pain    Rx / DC Orders ED Discharge Orders    None       Dorie Rank, MD 12/11/20 2248

## 2020-12-11 NOTE — Discharge Instructions (Signed)
Continue your current medications.  Follow-up with your primary care doctor.  It may take a couple weeks for your strength to completely come back after your COVID infection.

## 2020-12-15 ENCOUNTER — Emergency Department (HOSPITAL_COMMUNITY): Payer: Medicare Other

## 2020-12-15 ENCOUNTER — Inpatient Hospital Stay (HOSPITAL_COMMUNITY)
Admission: EM | Admit: 2020-12-15 | Discharge: 2020-12-21 | DRG: 314 | Disposition: A | Discharge status: Home Health | Payer: Medicare Other | Attending: Internal Medicine | Admitting: Internal Medicine

## 2020-12-15 ENCOUNTER — Other Ambulatory Visit: Payer: Self-pay

## 2020-12-15 ENCOUNTER — Encounter (HOSPITAL_COMMUNITY): Payer: Self-pay

## 2020-12-15 ENCOUNTER — Observation Stay (HOSPITAL_COMMUNITY): Payer: Medicare Other

## 2020-12-15 DIAGNOSIS — R569 Unspecified convulsions: Secondary | ICD-10-CM | POA: Diagnosis not present

## 2020-12-15 DIAGNOSIS — E785 Hyperlipidemia, unspecified: Secondary | ICD-10-CM | POA: Diagnosis not present

## 2020-12-15 DIAGNOSIS — I130 Hypertensive heart and chronic kidney disease with heart failure and stage 1 through stage 4 chronic kidney disease, or unspecified chronic kidney disease: Secondary | ICD-10-CM | POA: Diagnosis not present

## 2020-12-15 DIAGNOSIS — T80211A Bloodstream infection due to central venous catheter, initial encounter: Principal | ICD-10-CM | POA: Diagnosis present

## 2020-12-15 DIAGNOSIS — I5032 Chronic diastolic (congestive) heart failure: Secondary | ICD-10-CM | POA: Diagnosis present

## 2020-12-15 DIAGNOSIS — M810 Age-related osteoporosis without current pathological fracture: Secondary | ICD-10-CM | POA: Diagnosis not present

## 2020-12-15 DIAGNOSIS — Y848 Other medical procedures as the cause of abnormal reaction of the patient, or of later complication, without mention of misadventure at the time of the procedure: Secondary | ICD-10-CM | POA: Diagnosis present

## 2020-12-15 DIAGNOSIS — R7881 Bacteremia: Secondary | ICD-10-CM | POA: Diagnosis not present

## 2020-12-15 DIAGNOSIS — L8990 Pressure ulcer of unspecified site, unspecified stage: Secondary | ICD-10-CM | POA: Insufficient documentation

## 2020-12-15 DIAGNOSIS — B957 Other staphylococcus as the cause of diseases classified elsewhere: Secondary | ICD-10-CM | POA: Diagnosis present

## 2020-12-15 DIAGNOSIS — M879 Osteonecrosis, unspecified: Secondary | ICD-10-CM | POA: Diagnosis not present

## 2020-12-15 DIAGNOSIS — G894 Chronic pain syndrome: Secondary | ICD-10-CM | POA: Diagnosis not present

## 2020-12-15 DIAGNOSIS — K219 Gastro-esophageal reflux disease without esophagitis: Secondary | ICD-10-CM | POA: Diagnosis present

## 2020-12-15 DIAGNOSIS — N1831 Chronic kidney disease, stage 3a: Secondary | ICD-10-CM

## 2020-12-15 DIAGNOSIS — Z79899 Other long term (current) drug therapy: Secondary | ICD-10-CM

## 2020-12-15 DIAGNOSIS — T827XXA Infection and inflammatory reaction due to other cardiac and vascular devices, implants and grafts, initial encounter: Secondary | ICD-10-CM | POA: Diagnosis present

## 2020-12-15 DIAGNOSIS — E876 Hypokalemia: Secondary | ICD-10-CM | POA: Diagnosis present

## 2020-12-15 DIAGNOSIS — Z90710 Acquired absence of both cervix and uterus: Secondary | ICD-10-CM | POA: Diagnosis not present

## 2020-12-15 DIAGNOSIS — M8700 Idiopathic aseptic necrosis of unspecified bone: Secondary | ICD-10-CM | POA: Diagnosis present

## 2020-12-15 DIAGNOSIS — J1282 Pneumonia due to coronavirus disease 2019: Secondary | ICD-10-CM

## 2020-12-15 DIAGNOSIS — U071 COVID-19: Secondary | ICD-10-CM | POA: Diagnosis not present

## 2020-12-15 DIAGNOSIS — Z87891 Personal history of nicotine dependence: Secondary | ICD-10-CM | POA: Diagnosis not present

## 2020-12-15 DIAGNOSIS — Z765 Malingerer [conscious simulation]: Secondary | ICD-10-CM

## 2020-12-15 DIAGNOSIS — Z789 Other specified health status: Secondary | ICD-10-CM

## 2020-12-15 DIAGNOSIS — F32A Depression, unspecified: Secondary | ICD-10-CM | POA: Diagnosis present

## 2020-12-15 DIAGNOSIS — F419 Anxiety disorder, unspecified: Secondary | ICD-10-CM | POA: Diagnosis present

## 2020-12-15 DIAGNOSIS — Z7952 Long term (current) use of systemic steroids: Secondary | ICD-10-CM | POA: Diagnosis not present

## 2020-12-15 DIAGNOSIS — K529 Noninfective gastroenteritis and colitis, unspecified: Secondary | ICD-10-CM | POA: Diagnosis present

## 2020-12-15 DIAGNOSIS — F1120 Opioid dependence, uncomplicated: Secondary | ICD-10-CM | POA: Diagnosis present

## 2020-12-15 DIAGNOSIS — K912 Postsurgical malabsorption, not elsewhere classified: Secondary | ICD-10-CM | POA: Diagnosis present

## 2020-12-15 DIAGNOSIS — G4089 Other seizures: Secondary | ICD-10-CM | POA: Diagnosis not present

## 2020-12-15 DIAGNOSIS — K8050 Calculus of bile duct without cholangitis or cholecystitis without obstruction: Secondary | ICD-10-CM | POA: Diagnosis present

## 2020-12-15 DIAGNOSIS — N183 Chronic kidney disease, stage 3 unspecified: Secondary | ICD-10-CM | POA: Diagnosis present

## 2020-12-15 DIAGNOSIS — N184 Chronic kidney disease, stage 4 (severe): Secondary | ICD-10-CM | POA: Diagnosis present

## 2020-12-15 DIAGNOSIS — S2249XA Multiple fractures of ribs, unspecified side, initial encounter for closed fracture: Secondary | ICD-10-CM

## 2020-12-15 DIAGNOSIS — Z9049 Acquired absence of other specified parts of digestive tract: Secondary | ICD-10-CM | POA: Diagnosis not present

## 2020-12-15 DIAGNOSIS — R509 Fever, unspecified: Secondary | ICD-10-CM | POA: Diagnosis not present

## 2020-12-15 LAB — URINALYSIS, ROUTINE W REFLEX MICROSCOPIC
Bacteria, UA: NONE SEEN
Bilirubin Urine: NEGATIVE
Glucose, UA: NEGATIVE mg/dL
Hgb urine dipstick: NEGATIVE
Ketones, ur: NEGATIVE mg/dL
Leukocytes,Ua: NEGATIVE
Nitrite: NEGATIVE
Protein, ur: 30 mg/dL — AB
Specific Gravity, Urine: 1.012 (ref 1.005–1.030)
pH: 5 (ref 5.0–8.0)

## 2020-12-15 LAB — CBC WITH DIFFERENTIAL/PLATELET
Abs Immature Granulocytes: 0.03 10*3/uL (ref 0.00–0.07)
Basophils Absolute: 0 10*3/uL (ref 0.0–0.1)
Basophils Relative: 0 %
Eosinophils Absolute: 0 10*3/uL (ref 0.0–0.5)
Eosinophils Relative: 0 %
HCT: 28.8 % — ABNORMAL LOW (ref 36.0–46.0)
Hemoglobin: 9.3 g/dL — ABNORMAL LOW (ref 12.0–15.0)
Immature Granulocytes: 0 %
Lymphocytes Relative: 5 %
Lymphs Abs: 0.4 10*3/uL — ABNORMAL LOW (ref 0.7–4.0)
MCH: 30.4 pg (ref 26.0–34.0)
MCHC: 32.3 g/dL (ref 30.0–36.0)
MCV: 94.1 fL (ref 80.0–100.0)
Monocytes Absolute: 0.5 10*3/uL (ref 0.1–1.0)
Monocytes Relative: 6 %
Neutro Abs: 6.7 10*3/uL (ref 1.7–7.7)
Neutrophils Relative %: 89 %
Platelets: 191 10*3/uL (ref 150–400)
RBC: 3.06 MIL/uL — ABNORMAL LOW (ref 3.87–5.11)
RDW: 14.5 % (ref 11.5–15.5)
WBC: 7.6 10*3/uL (ref 4.0–10.5)
nRBC: 0 % (ref 0.0–0.2)

## 2020-12-15 LAB — COMPREHENSIVE METABOLIC PANEL
ALT: 24 U/L (ref 0–44)
AST: 26 U/L (ref 15–41)
Albumin: 3.5 g/dL (ref 3.5–5.0)
Alkaline Phosphatase: 184 U/L — ABNORMAL HIGH (ref 38–126)
Anion gap: 9 (ref 5–15)
BUN: 46 mg/dL — ABNORMAL HIGH (ref 6–20)
CO2: 18 mmol/L — ABNORMAL LOW (ref 22–32)
Calcium: 9.1 mg/dL (ref 8.9–10.3)
Chloride: 109 mmol/L (ref 98–111)
Creatinine, Ser: 1.44 mg/dL — ABNORMAL HIGH (ref 0.44–1.00)
GFR, Estimated: 42 mL/min — ABNORMAL LOW (ref >60)
Glucose, Bld: 86 mg/dL (ref 70–99)
Potassium: 4.4 mmol/L (ref 3.5–5.1)
Sodium: 136 mmol/L (ref 135–145)
Total Bilirubin: 1 mg/dL (ref 0.3–1.2)
Total Protein: 7.4 g/dL (ref 6.5–8.1)

## 2020-12-15 LAB — RAPID URINE DRUG SCREEN, HOSP PERFORMED
Amphetamines: NOT DETECTED
Barbiturates: NOT DETECTED
Benzodiazepines: NOT DETECTED
Cocaine: NOT DETECTED
Opiates: POSITIVE — AB
Tetrahydrocannabinol: NOT DETECTED

## 2020-12-15 LAB — LACTIC ACID, PLASMA: Lactic Acid, Venous: 0.5 mmol/L (ref 0.5–1.9)

## 2020-12-15 MED ORDER — BUPROPION HCL ER (SR) 100 MG PO TB12
200.0000 mg | ORAL_TABLET | Freq: Every day | ORAL | Status: DC
  Filled 2020-12-15: qty 2

## 2020-12-15 MED ORDER — DEXTRAN 70-HYPROMELLOSE 0.1-0.3 % OP SOLN: 1.0000 [drp] | Freq: Four times a day (QID) | OPHTHALMIC | Status: DC

## 2020-12-15 MED ORDER — ACETAMINOPHEN 650 MG RE SUPP
650.0000 mg | Freq: Once | RECTAL | Status: AC
  Administered 2020-12-15: 650 mg via RECTAL
  Filled 2020-12-15: qty 1

## 2020-12-15 MED ORDER — HYDROMORPHONE HCL 1 MG/ML IJ SOLN
1.0000 mg | Freq: Once | INTRAMUSCULAR | Status: AC
  Administered 2020-12-15: 1 mg via INTRAVENOUS
  Filled 2020-12-15: qty 1

## 2020-12-15 MED ORDER — DULOXETINE HCL 60 MG PO CPEP
90.0000 mg | ORAL_CAPSULE | Freq: Every day | ORAL | Status: DC
  Administered 2020-12-15 – 2020-12-21 (×7): 90 mg via ORAL
  Filled 2020-12-15 (×3): qty 1
  Filled 2020-12-15: qty 3
  Filled 2020-12-15 (×3): qty 1

## 2020-12-15 MED ORDER — VITAMIN B-12 1000 MCG PO TABS
1000.0000 ug | ORAL_TABLET | Freq: Every day | ORAL | Status: DC
  Administered 2020-12-16 – 2020-12-21 (×6): 1000 ug via ORAL
  Filled 2020-12-15 (×7): qty 1

## 2020-12-15 MED ORDER — LOPERAMIDE HCL 2 MG PO CAPS
2.0000 mg | ORAL_CAPSULE | ORAL | Status: DC | PRN
  Administered 2020-12-16: 2 mg via ORAL
  Filled 2020-12-15: qty 1

## 2020-12-15 MED ORDER — CYCLOSPORINE 0.05 % OP EMUL
1.0000 [drp] | Freq: Two times a day (BID) | OPHTHALMIC | Status: DC
  Administered 2020-12-15 – 2020-12-21 (×12): 1 [drp] via OPHTHALMIC
  Filled 2020-12-15 (×13): qty 1

## 2020-12-15 MED ORDER — BUDESONIDE 3 MG PO CPEP
3.0000 mg | ORAL_CAPSULE | Freq: Every day | ORAL | Status: DC
Start: 2020-12-16 — End: 2020-12-21
  Administered 2020-12-16 – 2020-12-21 (×6): 3 mg via ORAL
  Filled 2020-12-15 (×6): qty 1

## 2020-12-15 MED ORDER — SUCRALFATE 1 GM/10ML PO SUSP
1.0000 g | Freq: Three times a day (TID) | ORAL | Status: DC
  Administered 2020-12-15 – 2020-12-21 (×23): 1 g via ORAL
  Filled 2020-12-15 (×23): qty 10

## 2020-12-15 MED ORDER — IOHEXOL 300 MG/ML  SOLN
75.0000 mL | Freq: Once | INTRAMUSCULAR | Status: AC | PRN
  Administered 2020-12-15: 75 mL via INTRAVENOUS

## 2020-12-15 MED ORDER — PANTOPRAZOLE SODIUM 40 MG PO TBEC
40.0000 mg | DELAYED_RELEASE_TABLET | Freq: Every day | ORAL | Status: DC
  Administered 2020-12-16 – 2020-12-21 (×6): 40 mg via ORAL
  Filled 2020-12-15 (×6): qty 1

## 2020-12-15 MED ORDER — SODIUM CHLORIDE 0.9 % IV BOLUS
500.0000 mL | Freq: Once | INTRAVENOUS | Status: AC
  Administered 2020-12-15: 500 mL via INTRAVENOUS

## 2020-12-15 MED ORDER — DIPHENOXYLATE-ATROPINE 2.5-0.025 MG PO TABS: 1.0000 | ORAL_TABLET | Freq: Four times a day (QID) | ORAL | Status: DC | PRN

## 2020-12-15 MED ORDER — ONDANSETRON HCL 4 MG PO TABS: 4.0000 mg | ORAL_TABLET | Freq: Four times a day (QID) | ORAL | Status: DC | PRN

## 2020-12-15 MED ORDER — HYDROMORPHONE HCL 2 MG PO TABS
4.0000 mg | ORAL_TABLET | Freq: Four times a day (QID) | ORAL | Status: DC | PRN
  Administered 2020-12-16 – 2020-12-21 (×19): 4 mg via ORAL
  Filled 2020-12-15 (×19): qty 2

## 2020-12-15 MED ORDER — PANCRELIPASE (LIP-PROT-AMYL) 12000-38000 UNITS PO CPEP
36000.0000 [IU] | ORAL_CAPSULE | Freq: Three times a day (TID) | ORAL | Status: DC | PRN
  Administered 2020-12-19: 36000 [IU] via ORAL
  Filled 2020-12-15: qty 3
  Filled 2020-12-15: qty 1

## 2020-12-15 MED ORDER — PROMETHAZINE HCL 25 MG PO TABS: 25.0000 mg | ORAL_TABLET | Freq: Four times a day (QID) | ORAL | Status: DC | PRN

## 2020-12-15 MED ORDER — LACTATED RINGERS IV SOLN: INTRAVENOUS | Status: AC

## 2020-12-15 MED ORDER — ACETAMINOPHEN 650 MG RE SUPP: 650.0000 mg | Freq: Four times a day (QID) | RECTAL | Status: DC | PRN

## 2020-12-15 MED ORDER — BUDESONIDE 3 MG PO CPEP: 9.0000 mg | ORAL_CAPSULE | Freq: Every day | ORAL | Status: DC

## 2020-12-15 MED ORDER — ENOXAPARIN SODIUM 40 MG/0.4ML ~~LOC~~ SOLN
40.0000 mg | SUBCUTANEOUS | Status: DC
  Administered 2020-12-15 – 2020-12-20 (×6): 40 mg via SUBCUTANEOUS
  Filled 2020-12-15 (×6): qty 0.4

## 2020-12-15 MED ORDER — POLYVINYL ALCOHOL 1.4 % OP SOLN
1.0000 [drp] | Freq: Four times a day (QID) | OPHTHALMIC | Status: DC
  Administered 2020-12-15 – 2020-12-21 (×20): 1 [drp] via OPHTHALMIC
  Filled 2020-12-15 (×4): qty 15

## 2020-12-15 MED ORDER — HYDROMORPHONE HCL 2 MG PO TABS
4.0000 mg | ORAL_TABLET | Freq: Once | ORAL | Status: AC
Start: 2020-12-15 — End: 2020-12-15
  Administered 2020-12-15: 4 mg via ORAL
  Filled 2020-12-15: qty 2

## 2020-12-15 MED ORDER — HYDRALAZINE HCL 50 MG PO TABS
75.0000 mg | ORAL_TABLET | Freq: Three times a day (TID) | ORAL | Status: DC
  Administered 2020-12-16 – 2020-12-20 (×14): 75 mg via ORAL
  Filled 2020-12-15 (×14): qty 1

## 2020-12-15 MED ORDER — METHADONE HCL 5 MG PO TABS
5.0000 mg | ORAL_TABLET | Freq: Every day | ORAL | Status: DC
  Administered 2020-12-16 – 2020-12-21 (×29): 5 mg via ORAL
  Filled 2020-12-15 (×30): qty 1

## 2020-12-15 MED ORDER — ACETAMINOPHEN 500 MG PO TABS: 1000.0000 mg | ORAL_TABLET | Freq: Once | ORAL | Status: DC

## 2020-12-15 MED ORDER — METOPROLOL TARTRATE 50 MG PO TABS
100.0000 mg | ORAL_TABLET | Freq: Two times a day (BID) | ORAL | Status: DC
  Administered 2020-12-15 – 2020-12-21 (×12): 100 mg via ORAL
  Filled 2020-12-15 (×11): qty 2
  Filled 2020-12-15: qty 4

## 2020-12-15 MED ORDER — ACETAMINOPHEN 325 MG PO TABS
650.0000 mg | ORAL_TABLET | Freq: Four times a day (QID) | ORAL | Status: DC | PRN
  Administered 2020-12-16 – 2020-12-18 (×4): 650 mg via ORAL
  Filled 2020-12-15 (×4): qty 2

## 2020-12-15 MED ORDER — LORAZEPAM 2 MG/ML IJ SOLN
1.0000 mg | Freq: Once | INTRAMUSCULAR | Status: AC
  Administered 2020-12-15: 1 mg via INTRAVENOUS
  Filled 2020-12-15: qty 1

## 2020-12-15 MED ORDER — ONDANSETRON HCL 4 MG/2ML IJ SOLN: 4.0000 mg | Freq: Four times a day (QID) | INTRAMUSCULAR | Status: DC | PRN

## 2020-12-15 MED ORDER — HYDRALAZINE HCL 25 MG PO TABS: 75.0000 mg | ORAL_TABLET | Freq: Three times a day (TID) | ORAL | Status: DC

## 2020-12-15 MED ORDER — ESTRADIOL 1 MG PO TABS
2.0000 mg | ORAL_TABLET | Freq: Every day | ORAL | Status: DC
  Administered 2020-12-16 – 2020-12-21 (×6): 2 mg via ORAL
  Filled 2020-12-15 (×6): qty 2

## 2020-12-15 MED ORDER — AMLODIPINE BESYLATE 10 MG PO TABS
10.0000 mg | ORAL_TABLET | Freq: Every day | ORAL | Status: DC
  Administered 2020-12-16 – 2020-12-21 (×6): 10 mg via ORAL
  Filled 2020-12-15 (×7): qty 1

## 2020-12-15 MED ORDER — ACETAMINOPHEN 325 MG PO TABS: 650.0000 mg | ORAL_TABLET | Freq: Four times a day (QID) | ORAL | Status: DC | PRN

## 2020-12-15 MED ORDER — ONDANSETRON HCL 4 MG/2ML IJ SOLN
4.0000 mg | Freq: Once | INTRAMUSCULAR | Status: AC
  Administered 2020-12-15: 4 mg via INTRAVENOUS
  Filled 2020-12-15: qty 2

## 2020-12-15 NOTE — ED Triage Notes (Signed)
Pt arrives [facility] from home with complaints of a seizure lasting one minute per family. Pt was COVID + 10 days ago. EMS reports the pt was post ictal upon arrival. Pt is alert and oriented at this time.

## 2020-12-15 NOTE — ED Provider Notes (Signed)
Care transferred to me.  Patient's urinalysis and CTs are unremarkable.  WBC normal.  Does have fever.  She also has significant bacteremia history.  My suspicion that she has CNS infection is fairly low given she is not altered on my exam.  She states she feels confused and does not know the exact day of the week but this is also not atypical for her.  She does notice January 2022 when she is in the hospital and why she is in the hospital.  I discussed with Dr. Theda Sers who recommends observing and getting MRI but does not necessarily think she needs EEG at this point.  Neurology will consult in the morning.  No antiepileptics for now.  Does not think LP is needed.  Admit.   Sherwood Gambler, MD 12/15/20 2117

## 2020-12-15 NOTE — H&P (Signed)
History and Physical    Caitlin Peters TDS:[id number] DOB: 29-Dec-1960 DOA: 12/15/2020  PCP: Caitlin Peters, [name], MD  Patient coming from: Home  I have personally briefly reviewed patient's old medical records in [place]  Chief Complaint: seizure  HPI: Caitlin Peters is a 60 y.o. female with medical history significant of Crohn's dz, short gut syndrome, CKD 3, HTN, on chronic TPN, chronic pain syndrome, multiple prior line infections due to being on TPN since 2003.  Pt presents to ED with apparent witnessed seizure per daughter.  Pt initially confused but now mentating normally.  Pt was just admitted to hospital x10 days ago with COVID-19.   Thankfully mild COVID symptoms (probably due to vaccinated+boosted status), pt discharged 3 days later.  Denies cough, SOB.  Endorses nausea and abd discomfort (chronic)  Per daughter last had seizure ~1 decade ago.   ED Course: Tm 100.5.  WBC nl.  Creat 1.44.   Review of Systems: As per HPI, otherwise all review of systems negative.  Past Medical History:  Diagnosis Date  . Acute pancreatitis 04/13/2020  . Anasarca 10/2019  . AVN (avascular necrosis of bone) ([place])   . Cataract   . Choledocholithiasis (sludge) s/p ERCP 10/2019 10/21/2020  . Chronic pain syndrome   . CKD (chronic kidney disease), stage III ([place])   . Crohn disease ([place])   . Crohn's disease of small & large intestine with SGS 1984   Caitlin Peters is a 60year old female with a history of Crohn's disease diagnosed in 67 (age 40), history of long-term steroid use with osteoporosis, S/P multiple bowel resections ([id number]) complicated by chronic back and abdominal pain, steatorrhea and short bowel syndrome. The patient has been left with ~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 attached to proximal transverse colon through rectum. She has been  . Depression   . Diverticulosis   . GERD (gastroesophageal reflux disease)   . HTN (hypertension)   . IDA (iron deficiency  anemia)   . Malnutrition ([place])   . Mass in chest   . Osteoporosis   . Pancreatitis   . 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; 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; jejunal to transverse colon anastomosis Treated at [place] SB lengthening to 165cm Dr Alene Mires, [facility]  ESTABLISHED AT [facility]  . Vitamin B12 deficiency     Past Surgical History:  Procedure Laterality Date  . ABDOMINAL ADHESION SURGERY  01/22/2018  . APPENDECTOMY  1989  . BILIARY DILATION  11/26/2019   Procedure: BILIARY DILATION;  Surgeon: Jackquline Denmark, MD;  Location: [facility];  Service: Endoscopy;;  . BILIARY DILATION  03/08/2020   Procedure: BILIARY DILATION;  Surgeon: Irving Copas., MD;  Location: [place];  Service: Gastroenterology;;  . BIOPSY  03/08/2020   Procedure: BIOPSY;  Surgeon: Irving Copas., MD;  Location: [place];  Service: Gastroenterology;;  . CHEST WALL RESECTION     right thoracotomy,resection of chest mass with anterior rib and reconstruction using prosthetic mesh and video arthroscopy  . CHOLECYSTECTOMY  01/22/2018  . COLONOSCOPY  2019  . ENTEROSTOMY CLOSURE  04/1999  . ERCP N/A 11/26/2019   Procedure: ENDOSCOPIC RETROGRADE CHOLANGIOPANCREATOGRAPHY (ERCP);  Surgeon: Jackquline Denmark, MD;  Location: [facility];  Service: Endoscopy;  Laterality: N/A;  . ERCP N/A 03/08/2020   Procedure: ENDOSCOPIC RETROGRADE CHOLANGIOPANCREATOGRAPHY (ERCP);  Surgeon: Irving Copas., MD;  Location: [place];  Service: Gastroenterology;  Laterality: N/A;  . ESOPHAGOGASTRODUODENOSCOPY N/A 03/08/2020   Procedure: ESOPHAGOGASTRODUODENOSCOPY (EGD);  Surgeon: Irving Copas., MD;  Location: [place];  Service: Gastroenterology;  Laterality: N/A;  . EUS N/A 03/08/2020   Procedure: UPPER ENDOSCOPIC ULTRASOUND (EUS) LINEAR;  Surgeon: Irving Copas., MD;  Location: [place];   Service: Gastroenterology;  Laterality: N/A;  . ILEOCECETOMY  03/1999   ileocolon resection with abdominal stoma  . ILEOSTOMY CLOSURE  2001  . IR FLUORO GUIDE CV LINE LEFT  01/07/2020  . IR FLUORO GUIDE CV LINE LEFT  03/09/2020  . IR FLUORO GUIDE CV LINE LEFT  05/09/2020  . IR FLUORO GUIDE CV LINE LEFT  07/20/2020  . IR FLUORO GUIDE CV LINE RIGHT  08/05/2020  . IR PTA VENOUS EXCEPT DIALYSIS CIRCUIT  01/07/2020  . IR REMOVAL TUN CV CATH W/O FL  08/05/2020  . IR US GUIDE VASC ACCESS LEFT     x 2 06/17/19 and 09/14/2019  . IR US GUIDE VASC ACCESS RIGHT  08/05/2020  . KNEE SURGERY     right knee   . LAPAROSCOPIC SMALL BOWEL RESECTION  2009   2000-2009.  SB resections for Crohns Disease - now with Short gut  . OMENTECTOMY  01/22/2018  . PARTIAL HYSTERECTOMY  1984   with LSO  . REMOVAL OF STONES  11/26/2019   Procedure: REMOVAL OF STONES;  Surgeon: Jackquline Denmark, MD;  Location: [facility];  Service: Endoscopy;;  . REMOVAL OF STONES  03/08/2020   Procedure: REMOVAL OF STONES;  Surgeon: Irving Copas., MD;  Location: [place];  Service: Gastroenterology;;  . SALPINGOOPHORECTOMY Left 1984  . SALPINGOOPHORECTOMY Right 1990  . SERIAL TRANSVERSE ENTEROPLASTY (STEP) - SMALL BOWEL LENGTHENING  01/22/2018   Dr Alene Mires, [place] length from 120 to 165cm   . SMALL INTESTINE SURGERY  2002  . SMALL INTESTINE SURGERY  2003  . SPHINCTEROTOMY  11/26/2019   Procedure: SPHINCTEROTOMY;  Surgeon: Jackquline Denmark, MD;  Location[facility];  Service: Endoscopy;;  . TOTAL ABDOMINAL HYSTERECTOMY  1990   with RSO  . UPPER GASTROINTESTINAL ENDOSCOPY       reports that she has quit smoking. She has never used smokeless tobacco. She reports previous alcohol use. She reports that she does not use drugs.  Allergies  Allergen Reactions  . Meperidine Hives    Other reaction(s): GI Upset Due to [place]   . Hyoscyamine Hives and Swelling    Legs swelling   Disorientation  . Cefepime  Other (See Comments)    Neurotoxicity occurring in setting of AKI. Ceftriaxone tolerated during same admit  . Gabapentin Other (See Comments)    unknown  . Lyrica [Pregabalin] Other (See Comments)    unknown  . Topamax [Topiramate] Other (See Comments)    unknown  . Zosyn [Piperacillin Sod-Tazobactam So]     Patient reports it makes her vomit, her neck stiff, and her "heart feel funny"  . Fentanyl Rash    Pt is allergic to fentanyl patch related to the glue (gives her a rash) Pt states she is NOT allergic to fentanyl IV medicine  . Morphine And Related Rash    Family History  Problem Relation Age of Onset  . Breast cancer Sister   . Multiple sclerosis Sister   . Diabetes Sister   . Lupus Sister   . Colon cancer Other   . Crohn's disease Other   . Seizures Mother   . Glaucoma Mother   . CAD Father   .  Heart disease Father   . Hypertension Father      Prior to Admission medications   Medication Sig Start Date End Date Taking? Authorizing Provider  acetaminophen (TYLENOL) 325 MG tablet Take 650 mg by mouth every 6 (six) hours as needed for mild pain.    Yes [provider]  amLODipine (NORVASC) 10 MG tablet Take 1 tablet (10 mg total) by mouth daily. 12/07/20 01/06/21 Yes Erline Hau, MD  budesonide (ENTOCORT EC) 3 MG 24 hr capsule TAKE 3 CAPSULES BY MOUTH ONCE DAILY Patient taking differently: Take 9 mg by mouth daily. 04/05/20  Yes Caitlin Peters, [name], MD  [facility] SR) 100 MG 12 hr tablet Take 2 tablets (200 mg total) by mouth daily. 12/08/20  Yes Caitlin Peters, [name], MD  Calcium 200 MG TABS Take 200 mg by mouth daily.   Yes [provider]  cholecalciferol (VITAMIN D3) 25 MCG (1000 UT) tablet Take 1,000 Units by mouth daily.    Yes [provider]  cycloSPORINE (RESTASIS) 0.05 % ophthalmic emulsion Place 1 drop into both eyes 2 (two) times daily.   Yes [provider]  dexlansoprazole (DEXILANT) 60 MG  capsule Take 1 capsule (60 mg total) by mouth daily. 06/28/20  Yes Caitlin Peters, [name], MD  Dextran 70-Hypromellose 0.1-0.3 % SOLN Place 1 drop into both eyes 4 (four) times daily.   Yes [provider]  diphenoxylate-atropine (LOMOTIL) 2.5-0.025 MG tablet TAKE 1 TABLET BY MOUTH 4 TIMES DAILY AS NEEDED FOR DIARRHEA OR  LOOSE  STOOLS Patient taking differently: Take 1 tablet by mouth 4 (four) times daily as needed for diarrhea or loose stools. 11/23/20  Yes Caitlin Peters, [name], MD  DULoxetine (CYMBALTA) 30 MG capsule Take 3 capsules (90 mg total) by mouth daily. 12/07/20  Yes Caitlin Peters, [name], MD  estradiol (ESTRACE) 2 MG tablet Take 1 tablet (2 mg total) by mouth daily. 12/07/20  Yes Caitlin Peters, [name], MD  hydrALAZINE (APRESOLINE) 50 MG tablet Take 1.5 tablets (75 mg total) by mouth 3 (three) times daily. 11/22/20 02/20/21 Yes Chandrasekhar, Mahesh A, MD  HYDROmorphone (DILAUDID) 4 MG tablet Take 4 mg by mouth 4 (four) times daily as needed for moderate pain.  09/15/20  Yes [provider]  lipase/protease/amylase (CREON) 36000 UNITS CPEP capsule Take 1 capsule (36,000 Units total) by mouth 3 (three) times daily as needed (with meals for digestion). 06/28/20  Yes Caitlin Peters, [name], MD  loperamide (IMODIUM) 2 MG capsule Take 2 mg by mouth as needed for diarrhea or loose stools.   Yes [provider]  methadone (DOLOPHINE) 5 MG tablet Take 1 tablet (5 mg total) by mouth 5 (five) times daily. 06/30/20  Yes Florencia Reasons, MD  metoprolol tartrate (LOPRESSOR) 100 MG tablet Take 1 tablet (100 mg total) by mouth 2 (two) times daily. 12/07/20  Yes Caitlin Peters, [name], MD  Multiple Vitamins-Minerals (MULTIVITAMIN ADULT PO) Take 1 tablet by mouth daily.   Yes [provider]  NARCAN 4 MG/0.1ML LIQD nasal spray kit Place 1 spray into the nose once.  10/14/20  Yes [provider]  PRESCRIPTION MEDICATION Inject 1 each into the vein  daily. Home TPN . [facility] . 1 bag for 12 hours. [phone]   Yes [provider]  Probiotic Product (PROBIOTIC-10 PO) Take 1 capsule by mouth daily.    Yes [provider]  promethazine (PHENERGAN) 25 MG tablet Take  1 tablet (25 mg total) by mouth every 6 (six) hours as needed for nausea or vomiting. 12/07/20  Yes Caitlin Peters, [name], MD  sucralfate (CARAFATE) 1 GM/10ML suspension Take 10 mLs (1 g total) by mouth 4 (four) times daily -  with meals and at bedtime. 06/28/20  Yes Caitlin Peters, [name], MD  Trace Minerals Cu-Mn-Se-Zn (TRALEMENT IV) Inject 1 mL into the vein. Used in hydration bag 4 times weekly   Yes [provider]  vitamin B-12 (CYANOCOBALAMIN) 100 MCG tablet Take 1,000 mcg by mouth daily.    Yes [provider]  sodium chloride 0.9 % infusion Inject into the vein. 10/16/20   [provider]    Physical Exam: Vitals:   12/15/20 1900 12/15/20 1934 12/15/20 2030 12/15/20 2039  BP: 133/64 137/83 135/66 135/66  Pulse: 88 87 85 89  Resp: _0 Temp:    (!) 100.5 F (38.1 C)  TempSrc:    Oral  SpO2: 95% 95% 97% 99%  Weight:      Height:        Constitutional: NAD, calm, comfortable Eyes: PERRL, lids and conjunctivae normal ENMT: Mucous membranes are moist. Posterior pharynx clear of any exudate or lesions.Normal dentition.  Neck: normal, supple, no masses, no thyromegaly Respiratory: clear to auscultation bilaterally, no wheezing, no crackles. Normal respiratory effort. No accessory muscle use.  Cardiovascular: Regular rate and rhythm, no murmurs / rubs / gallops. No extremity edema. 2+ pedal pulses. No carotid bruits.  Abdomen: no tenderness, no masses palpated. No hepatosplenomegaly. Bowel sounds positive.  Musculoskeletal: no clubbing / cyanosis. No joint deformity upper and lower extremities. Good ROM, no contractures. Normal muscle tone.  Skin: no rashes, lesions, ulcers. No  induration Neurologic: CN 2-12 grossly intact. Sensation intact, DTR normal. Strength 5/5 in all 4.  Psychiatric: Normal judgment and insight. Alert and oriented x 3. Normal mood.    Labs on Admission: I have personally reviewed following labs and imaging studies  CBC: Recent Labs  Lab 12/15/20 1517  WBC 7.6  NEUTROABS 6.7  HGB 9.3*  HCT 28.8*  MCV 94.1  PLT 696   Basic Metabolic Panel: Recent Labs  Lab 12/15/20 1517  NA 136  K 4.4  CL 109  CO2 18*  GLUCOSE 86  BUN 46*  CREATININE 1.44*  CALCIUM 9.1   GFR: Estimated Creatinine Clearance: 40.6 mL/min (A) (by C-G formula based on SCr of 1.44 mg/dL (H)). Liver Function Tests: Recent Labs  Lab 12/15/20 1517  AST 26  ALT 24  ALKPHOS 184*  BILITOT 1.0  PROT 7.4  ALBUMIN 3.5   No results for input(s): LIPASE, AMYLASE in the last 168 hours. No results for input(s): AMMONIA in the last 168 hours. Coagulation Profile: No results for input(s): INR, PROTIME in the last 168 hours. Cardiac Enzymes: No results for input(s): CKTOTAL, CKMB, CKMBINDEX, TROPONINI in the last 168 hours. BNP (last 3 results) No results for input(s): PROBNP in the last 8760 hours. HbA1C: No results for input(s): HGBA1C in the last 72 hours. CBG: Recent Labs  Lab 12/11/20 1446  GLUCAP 82   Lipid Profile: No results for input(s): CHOL, HDL, LDLCALC, TRIG, CHOLHDL, LDLDIRECT in the last 72 hours. Thyroid Function Tests: No results for input(s): TSH, T4TOTAL, FREET4, T3FREE, THYROIDAB in the last 72 hours. Anemia Panel: No results for input(s): VITAMINB12, FOLATE, FERRITIN, TIBC, IRON, RETICCTPCT in the last 72 hours. Urine analysis:    Component Value Date/Time   COLORURINE STRAW (  A) 12/15/2020 [place] 12/15/2020 1738   LABSPEC 1.012 12/15/2020 1738   PHURINE 5.0 12/15/2020 [place] 12/15/2020 1738   HGBUR NEGATIVE 12/15/2020 [place] 12/15/2020 [place]  12/15/2020 1738   PROTEINUR 30 (A) 12/15/2020 1738   NITRITE NEGATIVE 12/15/2020 1738   LEUKOCYTESUR NEGATIVE 12/15/2020 1738    Radiological Exams on Admission: CT Head Wo Contrast  Result Date: 12/15/2020 CLINICAL DATA:  Seizure, acute, history of trauma. Additional provided: COVID positive 10 days ago. EXAM: CT HEAD WITHOUT CONTRAST TECHNIQUE: Contiguous axial images were obtained from the base of the skull through the vertex without intravenous contrast. COMPARISON:  Brain MRI 08/10/2020.  Head CT 08/09/2020. FINDINGS: Brain: Cerebral volume is normal. Moderate for age patchy hypodensity within the cerebral white matter, nonspecific. There is no acute intracranial hemorrhage. No demarcated cortical infarct. No extra-axial fluid collection. No evidence of intracranial mass. No midline shift. Vascular: No hyperdense vessel.  Atherosclerotic calcifications. Skull: Normal. Negative for fracture or focal lesion. Sinuses/Orbits: Visualized orbits show no acute finding. Mild mucosal thickening at the imaged levels, most notably ethmoidal. IMPRESSION: No evidence of acute intracranial abnormality. Moderate for age nonspecific cerebral white matter disease. Mild paranasal sinus mucosal thickening. Electronically Signed   By: Kellie Simmering DO   On: 12/15/2020 18:03   CT Abdomen Pelvis W Contrast  Result Date: 12/15/2020 CLINICAL DATA:  Abdominal pain. EXAM: CT ABDOMEN AND PELVIS WITH CONTRAST TECHNIQUE: Multidetector CT imaging of the abdomen and pelvis was performed using the standard protocol following bolus administration of intravenous contrast. CONTRAST:  2m OMNIPAQUE IOHEXOL 300 MG/ML  SOLN COMPARISON:  November 11, 2020 FINDINGS: Lower chest: No acute abnormality. Hepatobiliary: No focal liver abnormality is seen. A stable amount of pneumobilia is noted within the right lobe of the liver. Status post cholecystectomy. No biliary dilatation. Pancreas: There is mild pancreatic duct dilatation. The  pancreas is otherwise unremarkable. Spleen: Normal in size without focal abnormality. Adrenals/Urinary Tract: Adrenal glands are unremarkable. Kidneys are normal, without renal calculi, focal lesion, or hydronephrosis. Bladder is unremarkable. Stomach/Bowel: Stomach is within normal limits. The appendix is not identified. Surgically anastomosed bowel is seen throughout the abdomen. There is no evidence of bowel dilatation. Vascular/Lymphatic: A right common femoral central venous catheter is again seen which is unchanged in position. No additional significant vascular findings are present. No enlarged abdominal or pelvic lymph nodes. Reproductive: Status post hysterectomy. No adnexal masses. Other: No abdominal wall hernia or abnormality. No abdominopelvic ascites. Musculoskeletal: No acute or significant osseous findings. IMPRESSION: 1. Evidence of prior cholecystectomy with stable pneumobilia. 2. Status post cholecystectomy and hysterectomy. 3. Stable right common femoral central venous catheter positioning. Electronically Signed   By: TVirgina NorfolkM.D.   On: 12/15/2020 18:25   DG Chest Port 1 View  Result Date: 12/15/2020 CLINICAL DATA:  Shortness of breath, COVID positive 10 days ago. EXAM: PORTABLE CHEST 1 VIEW COMPARISON:  Chest x-ray 12/05/2020 FINDINGS: The heart size and mediastinal contours are stable. No focal consolidation. No pulmonary edema. No pleural effusion. No pneumothorax. No acute osseous abnormality. IMPRESSION: No active disease. Electronically Signed   By: MIven FinnM.D.   On: 12/15/2020 15:28    EKG: Independently reviewed.  Assessment/Plan Principal Problem:   Seizure ([name]) Active Problems:   Short gut syndrome   Chronic pain syndrome   On total parenteral nutrition (TPN)   CKD (chronic kidney disease) stage 3, GFR 30-59 ml/min ([name])  COVID-19 virus infection    1. Seizure - 1. Suspect febrile seizure due either to COVID-19, vs line infection. 2. Not septic  at this time. 3. Checking BCx to r/o line infection.  H/o ABC line infection in her R femoral PICC that was treated with ABx a couple months ago. 1. If line infection present: will have to try and save line as she has very limited access options. 4. Tele monitor 5. Per Dr. Theda Sers 1. Adm for obs 2. MRI brain 3. Doesn't need EEG or transfer to [facility] at this time 4. No seizure meds at this time. 6. Seizure precautions. 2. Short gut syndrome - 1. Cont TPN 3. Chronic pain syndrome - 1. Cont home narcotics 2. Avoid escalation -> see DC summary from 1 week ago. 4. CKD 3 - 1. Creat baseline 1.0, is 1.4 today, looks a little pre-renal 2. IVF: 500cc bolus in ED and will keep on LR at 75 overnight 3. Repeat BMP in AM 5. COVID-19 - 1. No respiratory symptoms. 2. Got remdesivir during admit 1 week ago.  DVT prophylaxis: Lovenox Code Status: Full Family Communication: No family in room Disposition Plan: Home after observation for seizure Consults called: EDP spoke with Dr. Theda Sers Admission status: Place in obs    Adain Geurin, [place] [facility]  How to contact the [facility] Attending or Consulting provider [place] or covering provider during after hours [place], for this patient?  1. Check the care team in [facility] and look for a) attending/consulting [facility] provider listed and b) the [facility] team listed 2. Log into [email or web address]  Amion Physician Scheduling and messaging for groups and whole hospitals  On call and physician scheduling software for group practices, residents, hospitalists and other medical providers for call, clinic, rotation and shift schedules. On[employer] is a hospital-wide system for scheduling doctors and paging doctors on call. EasyPlot is for scientific plotting and data analysis.  [email or web address]  and use 's universal password to access. If you do not have the password, please contact the hospital operator.  3. Locate the [facility] provider you are looking for under  [facility] and page to a number that you can be directly reached. 4. If you still have difficulty reaching the provider, please page the [facility] (Director on Call) for the Hospitalists listed on amion for assistance.  12/15/2020, 9:47 PM

## 2020-12-15 NOTE — ED Notes (Signed)
PICC line noted to right femoral

## 2020-12-15 NOTE — ED Notes (Signed)
Pharmacy called, since patient did not come in with TPN running, the Pharmacist said they will resume it tomorrow.

## 2020-12-15 NOTE — ED Notes (Signed)
Per DO- may use PICC for medication and lab draw

## 2020-12-15 NOTE — ED Notes (Signed)
Unable to obtain second set of blood culture

## 2020-12-15 NOTE — ED Notes (Signed)
Patient requesting pain medication, Dr. Regenia Skeeter made aware.

## 2020-12-15 NOTE — ED Notes (Signed)
Patient went to MRI.

## 2020-12-15 NOTE — ED Provider Notes (Addendum)
[facility] DEPT Provider Note   CSN: [id number] Arrival date & time: 12/15/20  1315     History Chief Complaint  Patient presents with  . Covid Positive  . Seizures    Caitlin Peters is a 60 y.o. female.  HPI   60 year old female with past medical history of HTN, HLD, CKD, pancreatitis, Crohn's disease with short gut syndrome, multiple abdominal surgeries, TPN dependent, chronic pain syndrome, indwelling PICC line with previous line infections, presents to the emergency department with reported seizure.  Report from EMS is that the family witnessed the patient having whole body shaking for approximately a minute.  Unclear if there is any incontinence.  Patient is noted to be COVID-positive on a test 10 days ago.  Patient currently endorses nausea and abdominal discomfort. Otherwise she is a poor historian and admittedly feels confused.  She states episodes like this confusion have happened in the past, she gets evaluated but there is never been a known etiology.  She denies history of seizure/epilepsy however the daughter told EMS that she had seizures, last one being about a decade ago.  Past Medical History:  Diagnosis Date  . Acute pancreatitis 04/13/2020  . Anasarca 10/2019  . AVN (avascular necrosis of bone) ([place])   . Cataract   . Choledocholithiasis (sludge) s/p ERCP 10/2019 10/21/2020  . Chronic pain syndrome   . CKD (chronic kidney disease), stage III ([place])   . Crohn disease ([place])   . Crohn's disease of small & large intestine with SGS 1984   Caitlin Peters is a 60year old female with a history of Crohn's disease diagnosed in 60 (age 59), history of long-term steroid use with osteoporosis, S/P multiple bowel resections ([id number]) complicated by chronic back and abdominal pain, steatorrhea and short bowel syndrome. The patient has been left with ~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 attached to proximal transverse colon through rectum. She has been  .  Depression   . Diverticulosis   . GERD (gastroesophageal reflux disease)   . HTN (hypertension)   . IDA (iron deficiency anemia)   . Malnutrition ([place])   . Mass in chest   . Osteoporosis   . Pancreatitis   . 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; 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; jejunal to transverse colon anastomosis Treated at [place] SB lengthening to 165cm Dr Alene Mires, [facility]  ESTABLISHED AT [facility]  . Vitamin B12 deficiency     Patient Active Problem List   Diagnosis Date Noted  . CKD (chronic kidney disease) stage 3, GFR 30-59 ml/min ([name]) 12/06/2020  . COVID-19 virus infection 12/06/2020  . Protein calorie malnutrition ([place]) 12/06/2020  . Palpitations 11/22/2020  . PAC (premature atrial contraction) 11/22/2020  . History of central line-associated bloodstream infection ([facility]) 10/21/2020  . Nausea & vomiting 10/21/2020  . Choledocholithiasis (sludge) s/p ERCP 10/2019 10/21/2020  . 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 Abdominal pain 09/07/2020  . Acute metabolic encephalopathy   . Hypomagnesemia   . Partial small bowel obstruction ([place])   . Bacteremia associated with intravascular line ([place]) 08/04/2020  . Enterobacter sepsis ([place]) 07/22/2020  . Anxiety 06/03/2020  . Acute pancreatitis 04/13/2020  . Infection due to Acinetobacter species 03/10/2020  . Pancytopenia ([place]) 03/05/2020  . Central line infection   . Elevated liver enzymes 01/02/2020  . Cholangitis   . Anasarca 10/28/2019  . Acute kidney injury superimposed on chronic kidney disease (  [place]) 10/28/2019  . Falls 10/28/2019  . Malnutrition ([place])   . Vitamin B12 deficiency   . GERD (gastroesophageal reflux disease)   . Chronic pain syndrome   . Hypokalemia due to excessive gastrointestinal loss of potassium 10/13/2019  . Fever   . Hypokalemia 10/12/2019  . Chronic diarrhea 10/12/2019  . Dysuria 10/12/2019  .  Bilateral lower extremity edema 10/12/2019  . AKI (acute kidney injury) ([place]) 10/12/2019  . Fungemia 08/27/2019  . IDA (iron deficiency anemia) 11/03/2018  . Dilation of biliary tract 08/28/2018  . Severe diarrhea 03/07/2018  . LFTs abnormal 01/27/2018  . GI tract obstruction ([place]) 01/27/2018  . Gram-negative bacteremia 09/13/2017  . HTN (hypertension), benign 12/02/2016  . Intractable pain 12/02/2016  . Anemia 12/02/2016  . 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 Avascular necrosis ([place]) 06/14/2015  . Polyarthralgia 05/03/2015  . On total parenteral nutrition (TPN) 12/30/2014  . Osteoporosis 12/24/2014  . Low back pain 12/16/2014  . Vitamin D deficiency 12/16/2014  . 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 History of colonic diverticulitis 2014  . Depression 07/24/2012  . Gram-positive bacteremia 07/24/2012  . Small bowel obstruction due to adhesions ([place]) 07/24/2012  . Diarrhea 12/13/2011  . Neuralgia and neuritis 06/01/2011  . Myalgia and myositis 08/12/2003  . Crohn's disease of small & large intestine with SGS 1984    Past Surgical History:  Procedure Laterality Date  . ABDOMINAL ADHESION SURGERY  01/22/2018  . APPENDECTOMY  1989  . BILIARY DILATION  11/26/2019   Procedure: BILIARY DILATION;  Surgeon: Jackquline Denmark, MD;  Location: [facility];  Service: Endoscopy;;  . BILIARY DILATION  03/08/2020   Procedure: BILIARY DILATION;  Surgeon: Irving Copas., MD;  Location: [facility];  Service: Gastroenterology;;  . BIOPSY  03/08/2020   Procedure: BIOPSY;  Surgeon: Irving Copas., MD;  Location: [facility];  Service: Gastroenterology;;  . CHEST WALL RESECTION     right thoracotomy,resection of chest mass with anterior rib and reconstruction using prosthetic mesh and video arthroscopy  . CHOLECYSTECTOMY  01/22/2018  . COLONOSCOPY  2019  . ENTEROSTOMY CLOSURE  04/1999  . ERCP N/A 11/26/2019   Procedure: ENDOSCOPIC RETROGRADE CHOLANGIOPANCREATOGRAPHY (ERCP);   Surgeon: Jackquline Denmark, MD;  Location: [facility];  Service: Endoscopy;  Laterality: N/A;  . ERCP N/A 03/08/2020   Procedure: ENDOSCOPIC RETROGRADE CHOLANGIOPANCREATOGRAPHY (ERCP);  Surgeon: Irving Copas., MD;  Location: [facility];  Service: Gastroenterology;  Laterality: N/A;  . ESOPHAGOGASTRODUODENOSCOPY N/A 03/08/2020   Procedure: ESOPHAGOGASTRODUODENOSCOPY (EGD);  Surgeon: Irving Copas., MD;  Location: [facility];  Service: Gastroenterology;  Laterality: N/A;  . EUS N/A 03/08/2020   Procedure: UPPER ENDOSCOPIC ULTRASOUND (EUS) LINEAR;  Surgeon: Irving Copas., MD;  Location: [facility];  Service: Gastroenterology;  Laterality: N/A;  . ILEOCECETOMY  03/1999   ileocolon resection with abdominal stoma  . ILEOSTOMY CLOSURE  2001  . IR FLUORO GUIDE CV LINE LEFT  01/07/2020  . IR FLUORO GUIDE CV LINE LEFT  03/09/2020  . IR FLUORO GUIDE CV LINE LEFT  05/09/2020  . IR FLUORO GUIDE CV LINE LEFT  07/20/2020  . IR FLUORO GUIDE CV LINE RIGHT  08/05/2020  . IR PTA VENOUS EXCEPT DIALYSIS CIRCUIT  01/07/2020  . IR REMOVAL TUN CV CATH W/O FL  08/05/2020  . IR US GUIDE VASC ACCESS LEFT     x 2 06/17/19 and 09/14/2019  . IR US GUIDE VASC ACCESS RIGHT  08/05/2020  . KNEE SURGERY     right knee   .  LAPAROSCOPIC SMALL BOWEL RESECTION  2009   2000-2009.  SB resections for Crohns Disease - now with Short gut  . OMENTECTOMY  01/22/2018  . PARTIAL HYSTERECTOMY  1984   with LSO  . REMOVAL OF STONES  11/26/2019   Procedure: REMOVAL OF STONES;  Surgeon: Jackquline Denmark, MD;  Location: [facility];  Service: Endoscopy;;  . REMOVAL OF STONES  03/08/2020   Procedure: REMOVAL OF STONES;  Surgeon: Irving Copas., MD;  Location: [facility];  Service: Gastroenterology;;  . SALPINGOOPHORECTOMY Left 1984  . SALPINGOOPHORECTOMY Right 1990  . SERIAL TRANSVERSE ENTEROPLASTY (STEP) - SMALL BOWEL LENGTHENING  01/22/2018   Dr Alene Mires, [place] length from 120 to 165cm   .  SMALL INTESTINE SURGERY  2002  . SMALL INTESTINE SURGERY  2003  . SPHINCTEROTOMY  11/26/2019   Procedure: SPHINCTEROTOMY;  Surgeon: Jackquline Denmark, MD;  Location: [facility];  Service: Endoscopy;;  . TOTAL ABDOMINAL HYSTERECTOMY  1990   with RSO  . UPPER GASTROINTESTINAL ENDOSCOPY       OB History   No obstetric history on file.     Family History  Problem Relation Age of Onset  . Breast cancer Sister   . Multiple sclerosis Sister   . Diabetes Sister   . Lupus Sister   . Colon cancer Other   . Crohn's disease Other   . Seizures Mother   . Glaucoma Mother   . CAD Father   . Heart disease Father   . Hypertension Father     Social History   Tobacco Use  . Smoking status: Former Research scientist (life sciences)  . Smokeless tobacco: Never Used  Vaping Use  . Vaping Use: Never used  Substance Use Topics  . Alcohol use: Not Currently  . Drug use: Never    Home Medications Prior to Admission medications   Medication Sig Start Date End Date Taking? Authorizing Provider  acetaminophen (TYLENOL) 325 MG tablet Take 650 mg by mouth every 6 (six) hours as needed for mild pain.     [provider]  amLODipine (NORVASC) 10 MG tablet Take 1 tablet (10 mg total) by mouth daily. 12/07/20 01/06/21  [name], [name], MD  budesonide (ENTOCORT EC) 3 MG 24 hr capsule TAKE 3 CAPSULES BY MOUTH ONCE DAILY 04/05/20   [name], [name], MD  buPROPion [facility] SR) 100 MG 12 hr tablet Take 2 tablets (200 mg total) by mouth daily. 12/08/20   [name], [name], MD  Calcium 200 MG TABS Take 200 mg by mouth daily.    [provider]  cholecalciferol (VITAMIN D3) 25 MCG (1000 UT) tablet Take 1,000 Units by mouth daily.     [provider]  cycloSPORINE (RESTASIS) 0.05 % ophthalmic emulsion Place 1 drop into both eyes 2 (two) times daily.    [provider]  dexlansoprazole (DEXILANT) 60 MG capsule Take 1 capsule (60 mg total) by mouth daily. 06/28/20   [name], [name], MD  Dextran 70-Hypromellose 0.1-0.3 % SOLN Place 1 drop into both eyes 4 (four) times daily.    [provider]  diphenoxylate-atropine (LOMOTIL) 2.5-0.025 MG tablet TAKE 1 TABLET BY MOUTH 4 TIMES DAILY AS NEEDED FOR DIARRHEA OR  LOOSE  STOOLS Patient taking differently: Take 1 tablet by mouth 4 (four) times daily as needed for diarrhea or loose stools. 11/23/20   [name], [name], MD  DULoxetine (CYMBALTA) 30 MG capsule Take 3 capsules (90 mg total) by mouth daily.  12/07/20   [name], [name], MD  estradiol (ESTRACE) 2 MG tablet Take 1 tablet (2 mg total) by mouth daily. 12/07/20   [name], [name], MD  hydrALAZINE (APRESOLINE) 50 MG tablet Take 1.5 tablets (75 mg total) by mouth 3 (three) times daily. 11/22/20 02/20/21  Rudean Haskell A, MD  HYDROmorphone (DILAUDID) 4 MG tablet Take 4 mg by mouth 4 (four) times daily as needed for moderate pain.  09/15/20   [provider]  lipase/protease/amylase (CREON) 36000 UNITS CPEP capsule Take 1 capsule (36,000 Units total) by mouth 3 (three) times daily as needed (with meals for digestion). 06/28/20   [name], [name], MD  loperamide (IMODIUM) 2 MG capsule Take 2 mg by mouth as needed for diarrhea or loose stools.    [provider]  methadone (DOLOPHINE) 5 MG tablet Take 1 tablet (5 mg total) by mouth 5 (five) times daily. 06/30/20   Florencia Reasons, MD  metoprolol tartrate (LOPRESSOR) 100 MG tablet Take 1 tablet (100 mg total) by mouth 2 (two) times daily. 12/07/20   [name], [name], MD  Multiple Vitamins-Minerals (MULTIVITAMIN ADULT PO) Take 1 tablet by mouth daily.    [provider]  NARCAN 4 MG/0.1ML LIQD nasal spray kit Place 1 spray into the nose once.  10/14/20   [provider]  PRESCRIPTION MEDICATION Inject 1 each into the vein daily. Home TPN . [facility] [place] . 1 bag for 12 hours. [phone]    [provider]  Probiotic Product (PROBIOTIC-10 PO) Take 1 capsule by mouth daily.     [provider]  promethazine (PHENERGAN) 25 MG tablet Take 1 tablet (25 mg total) by mouth every 6 (six) hours as needed for nausea or vomiting. 12/07/20   [name], [name], MD  sodium chloride 0.9 % infusion Inject into the vein. 10/16/20   [provider]  sucralfate (CARAFATE) 1 GM/10ML suspension Take 10 mLs (1 g total) by mouth 4 (four) times daily -  with meals and at bedtime. 06/28/20   [name], [name], MD  Trace Minerals Cu-Mn-Se-Zn (TRALEMENT IV) Inject 1 mL into the vein. Used in hydration bag 4 times weekly    [provider]  vitamin B-12 (CYANOCOBALAMIN) 100 MCG tablet Take 1,000 mcg by mouth daily.     [provider]    Allergies    Meperidine, Hyoscyamine, Cefepime, Gabapentin, Lyrica [pregabalin], Topamax [topiramate], Zosyn [piperacillin sod-tazobactam so], Fentanyl, and Morphine and related  Review of Systems   Review of Systems  Constitutional: Positive for fever.  HENT: Positive for sore throat.   Respiratory: Negative for shortness of breath.   Cardiovascular: Negative for chest pain.  Gastrointestinal: Positive for abdominal pain.  Musculoskeletal: Negative for neck pain and neck stiffness.  Neurological: Negative for headaches.  Psychiatric/Behavioral: Positive for confusion.    Physical Exam Updated Vital Signs BP (!) 206/85   Pulse 88   Temp (!) 101.3 F (38.5 C) (Oral)   Resp (!) 28   Ht 5' 8"  (1.727 m)   Wt 61.2 kg   SpO2 99%   BMI 20.51 kg/m   Physical Exam Vitals and nursing note reviewed.  Constitutional:      General: She is not in acute distress. HENT:     Head: Normocephalic.     Mouth/Throat:     Mouth: Mucous membranes are moist.  Eyes:     Pupils: Pupils are equal, round, and reactive  to light.  Cardiovascular:     Rate and Rhythm: Normal rate.  Pulmonary:     Effort: Pulmonary effort is  normal. No respiratory distress.  Abdominal:     Palpations: Abdomen is soft.     Tenderness: There is no abdominal tenderness.  Musculoskeletal:        General: No deformity.     Cervical back: Tenderness present. No rigidity.  Skin:    General: Skin is warm.  Neurological:     Mental Status: She is alert and oriented to person, place, and time.     Comments: Appears confused, amnesia of the event earlier today  Psychiatric:        Mood and Affect: Mood normal.     ED Results / Procedures / Treatments   Labs (all labs ordered are listed, but only abnormal results are displayed) Labs Reviewed  CBC WITH DIFFERENTIAL/PLATELET  COMPREHENSIVE METABOLIC PANEL  LACTIC ACID, PLASMA  LACTIC ACID, PLASMA  URINALYSIS, ROUTINE W REFLEX MICROSCOPIC  RAPID URINE DRUG SCREEN, HOSP PERFORMED    EKG EKG Interpretation  Date/Time:  Thursday December 15 2020 13:32:25 EST Ventricular Rate:  91 PR Interval:    QRS Duration: 78 QT Interval:  332 QTC Calculation: 409 R Axis:   54 Text Interpretation: Sinus rhythm Abnormal R-wave progression, early transition Consider left ventricular hypertrophy NSR, no STEMI Confirmed by Lavenia Atlas [phone]) on 12/15/2020 3:01:47 PM   Radiology DG Chest Port 1 View  Result Date: 12/15/2020 CLINICAL DATA:  Shortness of breath, COVID positive 10 days ago. EXAM: PORTABLE CHEST 1 VIEW COMPARISON:  Chest x-ray 12/05/2020 FINDINGS: The heart size and mediastinal contours are stable. No focal consolidation. No pulmonary edema. No pleural effusion. No pneumothorax. No acute osseous abnormality. IMPRESSION: No active disease. Electronically Signed   By: Iven Finn M.D.   On: 12/15/2020 15:28    Procedures Procedures (including critical care time)  Medications Ordered in ED Medications  acetaminophen (TYLENOL) suppository 650 mg (has no administration in time range)  ondansetron (ZOFRAN) injection 4 mg (has no administration in time range)  sodium  chloride 0.9 % bolus 500 mL (500 mLs Intravenous New Bag/Given (Non-Interop) 12/15/20 1537)    ED Course  I have reviewed the triage vital signs and the nursing notes.  Pertinent labs & imaging results that were available during my care of the patient were reviewed by me and considered in my medical decision making (see chart for details).    MDM Rules/Calculators/A&P                          60 year old female presents to the emergency department with possible seizure activity.  She is febrile on arrival, alert and oriented but states she feels confused.  Not necessarily postictal.  Patient has amnesia of today.  She states that she has had confused episodes like she is currently experiencing, she is been evaluated before with no etiology.  She denies any history of seizure disorder.  I have been unable to get a hold of the daughter.  Plan to evaluate patient broadly for sepsis and seizure.  Blood work is reassuring, does not appear to be acute sepsis.  We are still pending urinalysis.  I have ordered head CT scan as well as abdominal scan because she is tender on evaluation.  It seems likely that her fever is still from her COVID-positive diagnosis 10 days ago.  However if there is no other sources of fever  my plan was to consult neurology to evaluate for concern of fever with first-time seizure.  Patient signed out to Dr. Verner Chol.  Final Clinical Impression(s) / ED Diagnoses Final diagnoses:  None    Rx / DC Orders ED Discharge Orders    None       Lorelle Gibbs, DO 12/15/20 1716    Lorelle Gibbs, DO 12/16/20 0827

## 2020-12-15 NOTE — Progress Notes (Signed)
Brief Pharmacy Note:   Received consult for TPN. Patient with PMH of short gut syndrome on chronic TPN at home. After cutoff time for TPN preparation, so will resume TPN on 12/16/2020. Have ordered TPN labs for AM.   Lindell Spar, PharmD, BCPS Clinical Pharmacist  12/15/2020 9:25 PM

## 2020-12-15 NOTE — ED Notes (Addendum)
Patients PICC line does not pull back blood. When starting the LR fluids ordered, it says line occluded. IV team consulted.

## 2020-12-16 ENCOUNTER — Observation Stay (HOSPITAL_COMMUNITY)
Admission: EM | Admit: 2020-12-16 | Discharge: 2020-12-16 | Disposition: A | Discharge status: Home / Self Care | Payer: Medicare Other | Source: Home / Self Care | Attending: Internal Medicine | Admitting: Internal Medicine

## 2020-12-16 ENCOUNTER — Inpatient Hospital Stay (HOSPITAL_COMMUNITY): Admit: 2020-12-16 | Payer: Medicare Other

## 2020-12-16 ENCOUNTER — Other Ambulatory Visit: Payer: Self-pay

## 2020-12-16 ENCOUNTER — Observation Stay (HOSPITAL_COMMUNITY): Payer: Medicare Other

## 2020-12-16 DIAGNOSIS — Z765 Malingerer [conscious simulation]: Secondary | ICD-10-CM

## 2020-12-16 DIAGNOSIS — K219 Gastro-esophageal reflux disease without esophagitis: Secondary | ICD-10-CM | POA: Diagnosis present

## 2020-12-16 DIAGNOSIS — K912 Postsurgical malabsorption, not elsewhere classified: Secondary | ICD-10-CM | POA: Diagnosis present

## 2020-12-16 DIAGNOSIS — Z7952 Long term (current) use of systemic steroids: Secondary | ICD-10-CM | POA: Diagnosis not present

## 2020-12-16 DIAGNOSIS — T827XXA Infection and inflammatory reaction due to other cardiac and vascular devices, implants and grafts, initial encounter: Secondary | ICD-10-CM | POA: Diagnosis not present

## 2020-12-16 DIAGNOSIS — M879 Osteonecrosis, unspecified: Secondary | ICD-10-CM | POA: Diagnosis present

## 2020-12-16 DIAGNOSIS — N1831 Chronic kidney disease, stage 3a: Secondary | ICD-10-CM | POA: Diagnosis present

## 2020-12-16 DIAGNOSIS — F419 Anxiety disorder, unspecified: Secondary | ICD-10-CM | POA: Diagnosis present

## 2020-12-16 DIAGNOSIS — E785 Hyperlipidemia, unspecified: Secondary | ICD-10-CM | POA: Diagnosis present

## 2020-12-16 DIAGNOSIS — Z9049 Acquired absence of other specified parts of digestive tract: Secondary | ICD-10-CM | POA: Diagnosis not present

## 2020-12-16 DIAGNOSIS — E876 Hypokalemia: Secondary | ICD-10-CM | POA: Diagnosis present

## 2020-12-16 DIAGNOSIS — F1120 Opioid dependence, uncomplicated: Secondary | ICD-10-CM | POA: Diagnosis present

## 2020-12-16 DIAGNOSIS — R6510 Systemic inflammatory response syndrome (SIRS) of non-infectious origin without acute organ dysfunction: Secondary | ICD-10-CM

## 2020-12-16 DIAGNOSIS — G894 Chronic pain syndrome: Secondary | ICD-10-CM | POA: Diagnosis present

## 2020-12-16 DIAGNOSIS — M8700 Idiopathic aseptic necrosis of unspecified bone: Secondary | ICD-10-CM | POA: Diagnosis not present

## 2020-12-16 DIAGNOSIS — M810 Age-related osteoporosis without current pathological fracture: Secondary | ICD-10-CM | POA: Diagnosis present

## 2020-12-16 DIAGNOSIS — G40909 Epilepsy, unspecified, not intractable, without status epilepticus: Secondary | ICD-10-CM | POA: Diagnosis not present

## 2020-12-16 DIAGNOSIS — R569 Unspecified convulsions: Secondary | ICD-10-CM | POA: Diagnosis not present

## 2020-12-16 DIAGNOSIS — K8050 Calculus of bile duct without cholangitis or cholecystitis without obstruction: Secondary | ICD-10-CM

## 2020-12-16 DIAGNOSIS — U071 COVID-19: Secondary | ICD-10-CM | POA: Diagnosis present

## 2020-12-16 DIAGNOSIS — T80211A Bloodstream infection due to central venous catheter, initial encounter: Secondary | ICD-10-CM | POA: Diagnosis present

## 2020-12-16 DIAGNOSIS — B957 Other staphylococcus as the cause of diseases classified elsewhere: Secondary | ICD-10-CM | POA: Diagnosis present

## 2020-12-16 DIAGNOSIS — Y848 Other medical procedures as the cause of abnormal reaction of the patient, or of later complication, without mention of misadventure at the time of the procedure: Secondary | ICD-10-CM | POA: Diagnosis present

## 2020-12-16 DIAGNOSIS — Z79899 Other long term (current) drug therapy: Secondary | ICD-10-CM | POA: Diagnosis not present

## 2020-12-16 DIAGNOSIS — K5090 Crohn's disease, unspecified, without complications: Secondary | ICD-10-CM | POA: Diagnosis not present

## 2020-12-16 DIAGNOSIS — J1282 Pneumonia due to coronavirus disease 2019: Secondary | ICD-10-CM

## 2020-12-16 DIAGNOSIS — G4089 Other seizures: Secondary | ICD-10-CM | POA: Diagnosis present

## 2020-12-16 DIAGNOSIS — I130 Hypertensive heart and chronic kidney disease with heart failure and stage 1 through stage 4 chronic kidney disease, or unspecified chronic kidney disease: Secondary | ICD-10-CM | POA: Diagnosis present

## 2020-12-16 DIAGNOSIS — R509 Fever, unspecified: Secondary | ICD-10-CM | POA: Diagnosis present

## 2020-12-16 DIAGNOSIS — F32A Depression, unspecified: Secondary | ICD-10-CM | POA: Diagnosis present

## 2020-12-16 DIAGNOSIS — R7881 Bacteremia: Secondary | ICD-10-CM | POA: Diagnosis present

## 2020-12-16 DIAGNOSIS — I5032 Chronic diastolic (congestive) heart failure: Secondary | ICD-10-CM | POA: Diagnosis present

## 2020-12-16 DIAGNOSIS — F320 Major depressive disorder, single episode, mild: Secondary | ICD-10-CM

## 2020-12-16 DIAGNOSIS — Z87891 Personal history of nicotine dependence: Secondary | ICD-10-CM | POA: Diagnosis not present

## 2020-12-16 DIAGNOSIS — Z90710 Acquired absence of both cervix and uterus: Secondary | ICD-10-CM | POA: Diagnosis not present

## 2020-12-16 LAB — COMPREHENSIVE METABOLIC PANEL
ALT: 22 U/L (ref 0–44)
ALT: 21 U/L (ref 0–44)
AST: 24 U/L (ref 15–41)
AST: 22 U/L (ref 15–41)
Albumin: 3.1 g/dL — ABNORMAL LOW (ref 3.5–5.0)
Albumin: 2.9 g/dL — ABNORMAL LOW (ref 3.5–5.0)
Alkaline Phosphatase: 184 U/L — ABNORMAL HIGH (ref 38–126)
Alkaline Phosphatase: 158 U/L — ABNORMAL HIGH (ref 38–126)
Anion gap: 8 (ref 5–15)
Anion gap: 9 (ref 5–15)
BUN: 32 mg/dL — ABNORMAL HIGH (ref 6–20)
BUN: 28 mg/dL — ABNORMAL HIGH (ref 6–20)
CO2: 19 mmol/L — ABNORMAL LOW (ref 22–32)
CO2: 21 mmol/L — ABNORMAL LOW (ref 22–32)
Calcium: 9.2 mg/dL (ref 8.9–10.3)
Calcium: 8.8 mg/dL — ABNORMAL LOW (ref 8.9–10.3)
Chloride: 107 mmol/L (ref 98–111)
Chloride: 104 mmol/L (ref 98–111)
Creatinine, Ser: 1.39 mg/dL — ABNORMAL HIGH (ref 0.44–1.00)
Creatinine, Ser: 1.35 mg/dL — ABNORMAL HIGH (ref 0.44–1.00)
GFR, Estimated: 44 mL/min — ABNORMAL LOW (ref >60)
GFR, Estimated: 45 mL/min — ABNORMAL LOW (ref >60)
Glucose, Bld: 75 mg/dL (ref 70–99)
Glucose, Bld: 117 mg/dL — ABNORMAL HIGH (ref 70–99)
Potassium: 4.2 mmol/L (ref 3.5–5.1)
Potassium: 3.6 mmol/L (ref 3.5–5.1)
Sodium: 134 mmol/L — ABNORMAL LOW (ref 135–145)
Sodium: 134 mmol/L — ABNORMAL LOW (ref 135–145)
Total Bilirubin: 0.9 mg/dL (ref 0.3–1.2)
Total Bilirubin: 0.8 mg/dL (ref 0.3–1.2)
Total Protein: 6.6 g/dL (ref 6.5–8.1)
Total Protein: 6.2 g/dL — ABNORMAL LOW (ref 6.5–8.1)

## 2020-12-16 LAB — CBC WITH DIFFERENTIAL/PLATELET
Abs Immature Granulocytes: 0.01 10*3/uL (ref 0.00–0.07)
Basophils Absolute: 0 10*3/uL (ref 0.0–0.1)
Basophils Relative: 0 %
Eosinophils Absolute: 0 10*3/uL (ref 0.0–0.5)
Eosinophils Relative: 0 %
HCT: 24.3 % — ABNORMAL LOW (ref 36.0–46.0)
Hemoglobin: 8 g/dL — ABNORMAL LOW (ref 12.0–15.0)
Immature Granulocytes: 0 %
Lymphocytes Relative: 15 %
Lymphs Abs: 0.8 10*3/uL (ref 0.7–4.0)
MCH: 30.2 pg (ref 26.0–34.0)
MCHC: 32.9 g/dL (ref 30.0–36.0)
MCV: 91.7 fL (ref 80.0–100.0)
Monocytes Absolute: 0.3 10*3/uL (ref 0.1–1.0)
Monocytes Relative: 6 %
Neutro Abs: 4 10*3/uL (ref 1.7–7.7)
Neutrophils Relative %: 79 %
Platelets: 147 10*3/uL — ABNORMAL LOW (ref 150–400)
RBC: 2.65 MIL/uL — ABNORMAL LOW (ref 3.87–5.11)
RDW: 14.3 % (ref 11.5–15.5)
WBC: 5.1 10*3/uL (ref 4.0–10.5)
nRBC: 0 % (ref 0.0–0.2)

## 2020-12-16 LAB — CBC
HCT: 26.8 % — ABNORMAL LOW (ref 36.0–46.0)
Hemoglobin: 8.8 g/dL — ABNORMAL LOW (ref 12.0–15.0)
MCH: 30.2 pg (ref 26.0–34.0)
MCHC: 32.8 g/dL (ref 30.0–36.0)
MCV: 92.1 fL (ref 80.0–100.0)
Platelets: 171 10*3/uL (ref 150–400)
RBC: 2.91 MIL/uL — ABNORMAL LOW (ref 3.87–5.11)
RDW: 14.5 % (ref 11.5–15.5)
WBC: 8.1 10*3/uL (ref 4.0–10.5)
nRBC: 0 % (ref 0.0–0.2)

## 2020-12-16 LAB — DIFFERENTIAL
Abs Immature Granulocytes: 0.03 10*3/uL (ref 0.00–0.07)
Basophils Absolute: 0 10*3/uL (ref 0.0–0.1)
Basophils Relative: 0 %
Eosinophils Absolute: 0 10*3/uL (ref 0.0–0.5)
Eosinophils Relative: 0 %
Immature Granulocytes: 0 %
Lymphocytes Relative: 8 %
Lymphs Abs: 0.7 10*3/uL (ref 0.7–4.0)
Monocytes Absolute: 0.3 10*3/uL (ref 0.1–1.0)
Monocytes Relative: 4 %
Neutro Abs: 7.1 10*3/uL (ref 1.7–7.7)
Neutrophils Relative %: 88 %

## 2020-12-16 LAB — BLOOD CULTURE ID PANEL (REFLEXED) - BCID2

## 2020-12-16 LAB — MAGNESIUM
Magnesium: 2.2 mg/dL (ref 1.7–2.4)
Magnesium: 2 mg/dL (ref 1.7–2.4)
Magnesium: 1.9 mg/dL (ref 1.7–2.4)

## 2020-12-16 LAB — TSH: TSH: 0.362 u[IU]/mL (ref 0.350–4.500)

## 2020-12-16 LAB — LACTATE DEHYDROGENASE: LDH: 93 U/L — ABNORMAL LOW (ref 98–192)

## 2020-12-16 LAB — C-REACTIVE PROTEIN: CRP: 7.3 mg/dL — ABNORMAL HIGH (ref <1.0)

## 2020-12-16 LAB — GLUCOSE, CAPILLARY: Glucose-Capillary: 141 mg/dL — ABNORMAL HIGH (ref 70–99)

## 2020-12-16 LAB — PHOSPHORUS
Phosphorus: 3.6 mg/dL (ref 2.5–4.6)
Phosphorus: 3.3 mg/dL (ref 2.5–4.6)

## 2020-12-16 LAB — TRIGLYCERIDES: Triglycerides: 72 mg/dL (ref <150)

## 2020-12-16 LAB — PROCALCITONIN: Procalcitonin: 9.53 ng/mL

## 2020-12-16 LAB — PREALBUMIN: Prealbumin: 26.4 mg/dL (ref 18–38)

## 2020-12-16 LAB — FERRITIN: Ferritin: 68 ng/mL (ref 11–307)

## 2020-12-16 LAB — D-DIMER, QUANTITATIVE: D-Dimer, Quant: 1.34 ug/mL-FEU — ABNORMAL HIGH (ref 0.00–0.50)

## 2020-12-16 MED ORDER — ALTEPLASE 2 MG IJ SOLR
2.0000 mg | Freq: Once | INTRAMUSCULAR | Status: AC
  Administered 2020-12-16: 2 mg
  Filled 2020-12-16: qty 2

## 2020-12-16 MED ORDER — LACTATED RINGERS IV SOLN: INTRAVENOUS | Status: DC

## 2020-12-16 MED ORDER — TRACE MINERALS CU-MN-SE-ZN 300-55-60-3000 MCG/ML IV SOLN
INTRAVENOUS | Status: AC
  Filled 2020-12-16: qty 580

## 2020-12-16 MED ORDER — SODIUM CHLORIDE 0.9% FLUSH: 10.0000 mL | INTRAVENOUS | Status: DC | PRN

## 2020-12-16 MED ORDER — LEVETIRACETAM 500 MG PO TABS
500.0000 mg | ORAL_TABLET | Freq: Two times a day (BID) | ORAL | Status: DC
  Administered 2020-12-16 – 2020-12-21 (×10): 500 mg via ORAL
  Filled 2020-12-16 (×10): qty 1

## 2020-12-16 MED ORDER — STERILE WATER FOR INJECTION IJ SOLN
INTRAMUSCULAR | Status: AC
  Filled 2020-12-16: qty 10

## 2020-12-16 MED ORDER — CHLORHEXIDINE GLUCONATE CLOTH 2 % EX PADS
6.0000 | MEDICATED_PAD | Freq: Every day | CUTANEOUS | Status: DC
  Administered 2020-12-16 – 2020-12-21 (×6): 6 via TOPICAL

## 2020-12-16 NOTE — Progress Notes (Signed)
Key: [id number] - Rx #: [id number] Need help? Call us at [phone] Outcome Additional Information Required Available without authorization. Drug Promethazine HCl 25MG tablets Form [employer] Medicare 4-Part Electronic [place] Form [phone] NCPDP) [place] [phone]

## 2020-12-16 NOTE — Progress Notes (Signed)
PROGRESS NOTE    Caitlin Peters  WSF:[id number] DOB: 04/30/1961 DOA: 12/15/2020 PCP: [name], [name], MD     Brief Narrative:  Caitlin Peters is a 60 y.o. BF PMHx depression, acute pancreatitis, choledocholithiasis, Crohn's dz, short gut syndrome, CKD stage III, HTN, on chronic TPN, chronic pain syndrome, multiple prior line infections due to being on TPN since 2003.  Mass in chest, drug-seeking behavior  Pt presents to ED with apparent witnessed seizure per daughter.  Pt initially confused but now mentating normally.  Pt was just discharged from hospital for incidental COVID 19 infection 12/08/2020 (received 3-day course remdesivir).   Thankfully mild COVID symptoms (probably due to vaccinated+boosted status), pt discharged 3 days later.  Denies cough, SOB.  Endorses nausea and abd discomfort (chronic)  Per daughter last had seizure ~1 decade ago.    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[place])   CKD (chronic kidney disease) stage 3, GFR 30-59 ml/min ([place])   COVID-19 virus infection   Drug-seeking behavior  SIRS -On admission meets criteria for SIRS temp> 38 C, RR> 20 -1/21 procalcitonin pending - 1/21 PCXR pending - Lactic acid= 0.5 -  Seizure - Per neurology recommendation: -1/21 EEG pending -1/21 Wellbutrin 200 mg daily (hold) may lower seizure threshold -1/21 UDS positive opiates: Patient on opiates at home - Inpatient seizure precautions; IV Ativan PRN for seizure lasting longer than 5 minutes - Outpatient seizure precautions:Per [facility] statutes, patients with seizures are not allowed to drive until they have been seizure-free  for six months.  Use caution when using heavy equipment or power tools. Avoid working on ladders or at heights. Take showers instead of baths. Ensure the water temperature is not too high on the home water heater. Do not go swimming alone. Do not lock yourself in a room alone (i.e. bathroom). When caring for infants or small children, sit down when holding, feeding, or changing them to minimize risk of injury to the child in the event you have a seizure. Maintain good sleep hygiene. Avoid alcohol.   COVID infection COVID-19 Labs  Recent Labs    12/16/20 1215  DDIMER 1.34*  LDH 93*    Lab Results  Component Value Date   SARSCOV2NAA POSITIVE (A) 12/05/2020   [place] NEGATIVE 11/07/2020   [place] NEGATIVE 10/21/2020   [place] NEGATIVE 09/01/2020  -Patient was treated with Remdesivir x3 days and discharged on 12/08/2020 - Patient will be contagious until 12/26/2020, while in the hospital should be placed in airborne protection.  Short gut syndrome - Continue TPN  Chronic pain syndrome  CKD stage III     DVT prophylaxis: Lovenox Code Status: Full Family Communication:  Status is: Inpatient    Dispo: The patient is from: Home              Anticipated d/c is to: Home              Anticipated d/c date is: 1/23              Patient currently unstable      Consultants:  Neurology  Procedures/Significant Events:  EEG pending    I have  personally reviewed and interpreted all radiology studies and my findings are as above.  VENTILATOR SETTINGS:    Cultures   Antimicrobials:    Devices    LINES / TUBES:      Continuous Infusions:  lactated ringers 75 mL/hr at 12/15/20 2252     Objective: Vitals:   12/16/20 0530 12/16/20 0547 12/16/20 0630 12/16/20 0900  BP: (!) 173/75  (!) 163/72 (!) 154/78  Pulse: 83  80   Resp: 16  17   Temp:  100 F (37.8 C)    TempSrc:  Oral    SpO2: 97%  93%   Weight:      Height:       No intake or  output data in the 24 hours ending 12/16/20 0944 Filed Weights   12/15/20 1332  Weight: 61.2 kg    Examination:  General: No acute respiratory distress Eyes: negative scleral hemorrhage, negative anisocoria, negative icterus ENT: Negative Runny nose, negative gingival bleeding, Neck:  Negative scars, masses, torticollis, lymphadenopathy, JVD Lungs: Clear to auscultation bilaterally without wheezes or crackles Cardiovascular: Regular rate and rhythm without murmur gallop or rub normal S1 and S2 Abdomen: negative abdominal pain, nondistended, positive soft, bowel sounds, no rebound, no ascites, no appreciable mass Extremities: No significant cyanosis, clubbing, or edema bilateral lower extremities Skin: Negative rashes, lesions, ulcers Psychiatric:  Negative depression, negative anxiety, negative fatigue, negative mania  Central nervous system:  Cranial nerves II through XII intact, tongue/uvula midline, all extremities muscle strength 5/5, sensation intact throughout, negative dysarthria, negative expressive aphasia, negative receptive aphasia.  .     Data Reviewed: Care during the described time interval was provided by me .  I have reviewed this patient's available data, including medical history, events of note, physical examination, and all test results as part of my evaluation.  CBC: Recent Labs  Lab 12/15/20 1517 12/16/20 0320  WBC 7.6 8.1  NEUTROABS 6.7 7.1  HGB 9.3* 8.8*  HCT 28.8* 26.8*  MCV 94.1 92.1  PLT 191 478   Basic Metabolic Panel: Recent Labs  Lab 12/15/20 0106 12/15/20 1517 12/16/20 0320  NA  --  136 134*  K  --  4.4 4.2  CL  --  109 107  CO2  --  18* 19*  GLUCOSE  --  86 75  BUN  --  46* 32*  CREATININE  --  1.44* 1.39*  CALCIUM  --  9.1 9.2  MG 2.2  --  2.0  PHOS  --   --  3.6   GFR: Estimated Creatinine Clearance: 42.1 mL/min (A) (by C-G formula based on SCr of 1.39 mg/dL (H)). Liver Function Tests: Recent Labs  Lab 12/15/20 1517  12/16/20 0320  AST 26 24  ALT 24 22  ALKPHOS 184* 184*  BILITOT 1.0 0.9  PROT 7.4 6.6  ALBUMIN 3.5 3.1*   No results for input(s): LIPASE, AMYLASE in the last 168 hours. No results for input(s): AMMONIA in the last 168 hours. Coagulation Profile: No results for input(s): INR, PROTIME in the last 168 hours. Cardiac Enzymes: No results for input(s): CKTOTAL, CKMB, CKMBINDEX, TROPONINI in the last 168 hours. BNP (last 3 results) No results for input(s): PROBNP in the last 8760 hours. HbA1C: No results for input(s): HGBA1C in the last 72 hours. CBG: Recent Labs  Lab 12/11/20 1446  GLUCAP 82   Lipid Profile: Recent Labs    12/16/20 0320  TRIG 72   Thyroid Function Tests: Recent Labs    12/15/20  0106  TSH 0.362   Anemia Panel: No results for input(s): VITAMINB12, FOLATE, FERRITIN, TIBC, IRON, RETICCTPCT in the last 72 hours. Sepsis Labs: Recent Labs  Lab 12/15/20 1517  LATICACIDVEN 0.5    No results found for this or any previous visit (from the past 240 hour(s)).       Radiology Studies: CT Head Wo Contrast  Result Date: 12/15/2020 CLINICAL DATA:  Seizure, acute, history of trauma. Additional provided: COVID positive 10 days ago. EXAM: CT HEAD WITHOUT CONTRAST TECHNIQUE: Contiguous axial images were obtained from the base of the skull through the vertex without intravenous contrast. COMPARISON:  Brain MRI 08/10/2020.  Head CT 08/09/2020. FINDINGS: Brain: Cerebral volume is normal. Moderate for age patchy hypodensity within the cerebral white matter, nonspecific. There is no acute intracranial hemorrhage. No demarcated cortical infarct. No extra-axial fluid collection. No evidence of intracranial mass. No midline shift. Vascular: No hyperdense vessel.  Atherosclerotic calcifications. Skull: Normal. Negative for fracture or focal lesion. Sinuses/Orbits: Visualized orbits show no acute finding. Mild mucosal thickening at the imaged levels, most notably ethmoidal.  IMPRESSION: No evidence of acute intracranial abnormality. Moderate for age nonspecific cerebral white matter disease. Mild paranasal sinus mucosal thickening. Electronically Signed   By: Kellie Simmering DO   On: 12/15/2020 18:03   MR BRAIN WO CONTRAST  Result Date: 12/15/2020 CLINICAL DATA:  Initial evaluation for acute seizure, COVID positive. EXAM: MRI HEAD WITHOUT CONTRAST TECHNIQUE: Multiplanar, multiecho pulse sequences of the brain and surrounding structures were obtained without intravenous contrast. COMPARISON:  Prior CT from earlier the same day as well as previous brain MRI from 08/10/2020. FINDINGS: Brain: Age-appropriate cerebral volume loss. Again seen is patchy T2/FLAIR hyperintensity involving the periventricular, deep, and subcortical white matter both cerebral hemispheres, nonspecific, but most like related chronic microvascular ischemic disease. Appearance is stable from previous. No abnormal foci of restricted diffusion to suggest acute or subacute ischemia or changes related to seizure. Gray-white matter differentiation maintained. No encephalomalacia to suggest chronic cortical infarction. No foci of susceptibility artifact to suggest acute or chronic intracranial hemorrhage. No mass lesion, midline shift or mass effect. No hydrocephalus or extra-axial fluid collection. Pituitary gland suprasellar region normal. Midline structures intact. No definite intrinsic temporal lobe abnormality. Vascular: Major intracranial vascular flow voids are maintained. Skull and upper cervical spine: Craniocervical junction within normal limits. Bone marrow signal intensity normal. No scalp soft tissue abnormality. Sinuses/Orbits: Globes and orbital soft tissues within normal limits. Scattered mucosal thickening noted within the ethmoidal air cells and maxillary sinuses. Paranasal sinuses are otherwise clear. Trace fluid signal noted within the mastoid air cells bilaterally, of doubtful significance. Inner ear  structures grossly normal. Other: None. IMPRESSION: 1. No acute intracranial abnormality. 2. Mild to moderate cerebral white matter changes, nonspecific, but most likely related to chronic microvascular ischemic disease, stable. Electronically Signed   By: Jeannine Boga M.D.   On: 12/15/2020 22:45   CT Abdomen Pelvis W Contrast  Result Date: 12/15/2020 CLINICAL DATA:  Abdominal pain. EXAM: CT ABDOMEN AND PELVIS WITH CONTRAST TECHNIQUE: Multidetector CT imaging of the abdomen and pelvis was performed using the standard protocol following bolus administration of intravenous contrast. CONTRAST:  53m OMNIPAQUE IOHEXOL 300 MG/ML  SOLN COMPARISON:  November 11, 2020 FINDINGS: Lower chest: No acute abnormality. Hepatobiliary: No focal liver abnormality is seen. A stable amount of pneumobilia is noted within the right lobe of the liver. Status post cholecystectomy. No biliary dilatation. Pancreas: There is mild pancreatic duct dilatation. The pancreas is otherwise unremarkable.  Spleen: Normal in size without focal abnormality. Adrenals/Urinary Tract: Adrenal glands are unremarkable. Kidneys are normal, without renal calculi, focal lesion, or hydronephrosis. Bladder is unremarkable. Stomach/Bowel: Stomach is within normal limits. The appendix is not identified. Surgically anastomosed bowel is seen throughout the abdomen. There is no evidence of bowel dilatation. Vascular/Lymphatic: A right common femoral central venous catheter is again seen which is unchanged in position. No additional significant vascular findings are present. No enlarged abdominal or pelvic lymph nodes. Reproductive: Status post hysterectomy. No adnexal masses. Other: No abdominal wall hernia or abnormality. No abdominopelvic ascites. Musculoskeletal: No acute or significant osseous findings. IMPRESSION: 1. Evidence of prior cholecystectomy with stable pneumobilia. 2. Status post cholecystectomy and hysterectomy. 3. Stable right common femoral  central venous catheter positioning. Electronically Signed   By: Virgina Norfolk M.D.   On: 12/15/2020 18:25   DG Chest Port 1 View  Result Date: 12/15/2020 CLINICAL DATA:  Shortness of breath, COVID positive 10 days ago. EXAM: PORTABLE CHEST 1 VIEW COMPARISON:  Chest x-ray 12/05/2020 FINDINGS: The heart size and mediastinal contours are stable. No focal consolidation. No pulmonary edema. No pleural effusion. No pneumothorax. No acute osseous abnormality. IMPRESSION: No active disease. Electronically Signed   By: Iven Finn M.D.   On: 12/15/2020 15:28        Scheduled Meds:  amLODipine  10 mg Oral Daily   budesonide  3 mg Oral Daily   buPROPion  200 mg Oral Daily   cycloSPORINE  1 drop Both Eyes BID   DULoxetine  90 mg Oral Daily   enoxaparin (LOVENOX) injection  40 mg Subcutaneous Q24H   estradiol  2 mg Oral Daily   hydrALAZINE  75 mg Oral TID   methadone  5 mg Oral 5 X Daily   metoprolol tartrate  100 mg Oral BID   pantoprazole  40 mg Oral Daily   polyvinyl alcohol  1 drop Both Eyes QID   sucralfate  1 g Oral TID WC & HS   vitamin B-12  1,000 mcg Oral Daily   Continuous Infusions:  lactated ringers 75 mL/hr at 12/15/20 2252     LOS: 0 days    Time spent:40 min    Zaydenn Balaguer, Geraldo Docker, MD [facility] Pager [phone]  If 7PM-7AM, please contact night-coverage [email or web address] Password [facility] 12/16/2020, 9:44 AM

## 2020-12-16 NOTE — Procedures (Signed)
Patient Name: ARIYEL JEANGILLES  MRN: [id number]  Epilepsy Attending: Lora Havens  Referring Physician/Provider: Dr. Dia Crawford Date: 12/16/2020 Duration: 26.31 mins  Patient history: 60 year old female who presented with seizures.  EEG to evaluate for seizures.  Level of alertness: Awake, drowsy  AEDs during EEG study: None  Technical aspects: This EEG study was done with scalp electrodes positioned according to the 10-20 International system of electrode placement. Electrical activity was acquired at a sampling rate of 500Hz  and reviewed with a high frequency filter of 70Hz  and a low frequency filter of 1Hz . EEG data were recorded continuously and digitally stored.   Description: The posterior dominant rhythm consists of 8.5 Hz activity of moderate voltage (25-35 uV) seen predominantly in posterior head regions, symmetric and reactive to eye opening and eye closing. Drowsiness was characterized by attenuation of the posterior background rhythm. EEG showed intermittent generalized 2-3 hz delta slowing. Physioloic photic driving was not seen during photic stimulation.  Hyperventilation was not performed.     ABNORMALITY -Intermittent slow, generalized  IMPRESSION: This study is suggestive of mild diffuse encephalopathy, nonspecific etiology. No seizures or epileptiform discharges were seen throughout the recording.   Davontae Prusinski Barbra Sarks

## 2020-12-16 NOTE — ED Notes (Signed)
Patient complaining of new IV placed by IV team burning in left arm. She said "I can't deal with the fluids it burns too bad." After educating patient on the importance of having fluids, she said "I still can't deal."

## 2020-12-16 NOTE — Progress Notes (Signed)
EEG complete - results pending 

## 2020-12-16 NOTE — Consult Note (Signed)
Neurology Consultation  Reason for Consult: Concern for seizure Referring Physician: Dr. Regenia Skeeter  CC: Seizure-like activity  History is obtained from: patient, patient daughter  HPI: Caitlin Peters is a 60 y.o. 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 Per patient's daughter, [name] was sleeping on the couch when she made a low-pitch hum, began to experience upper body/torso shaking, and was foaming at the mouth for 1-2 minutes before resolution. Her arms remained at her side and there were no synchronized movements noted during this episode but following the shaking, her body was tight and stiff all over. She did not respond to verbal stimulation at this time and was able to open her eyes after 1-2 minutes but remained nonverbal. Her daughter states she was confused and unable to tell her what state she was in. There was no incontinence or tongue biting noted during this event. EMS was subsequently activated. Patient remains amnestic to the event and does not recall what she was doing yesterday, only remembers waking up in the hospital with confusion that has since resolved and she feels that she is back to her baseline mental status.   Of note, her daughter recalls an event over 20 years ago laying in Caitlin Peters's bed when she experienced full body shaking. She was not placed on antiepileptic medication following this event. Ms. Elms denies recent fevers, missed or changed medication doses, or shortness of breath. She states that when she has PICC line infections, her initial presentation is confusion. On arrival to the ED, she had mild temperature  elevation at 100.5 degrees, a creatinine of 1.39, hemoglobin of 8.8, hematocrit of 26.8, and UDS positive for opiates.   ROS: A 14 point ROS was performed and is negative except as noted in the HPI.   Past Medical History:  Diagnosis Date   Acute pancreatitis 04/13/2020   Anasarca 10/2019   AVN (avascular necrosis of bone) ([name])    Cataract    Choledocholithiasis (sludge) s/p ERCP 10/2019 10/21/2020   Chronic pain syndrome    CKD (chronic kidney disease), stage III ([place])    Crohn disease ([place])    Crohn's disease of small & large intestine with [place]   Caitlin Peters is a 60year old female with a history of Crohn's disease diagnosed in 65 (age 17), history of long-term steroid use with osteoporosis, S/P multiple bowel resections ([id number]) complicated by chronic back and abdominal pain, steatorrhea and short bowel syndrome. The patient has been left with ~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 attached to proximal transverse colon through rectum. She has been   Depression    Diverticulosis    GERD (gastroesophageal reflux disease)    HTN (hypertension)    IDA (iron deficiency anemia)    Malnutrition ([place])    Mass in chest    Osteoporosis    Pancreatitis    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; 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; jejunal to transverse colon anastomosis Treated at [facility] lengthening to 165cm Dr Alene Mires, [facility]   Vitamin B12 deficiency    Family History  Problem Relation Age of Onset  Breast cancer Sister    Multiple sclerosis Sister    Diabetes Sister    Lupus Sister    Colon cancer Other    Crohn's disease Other    Seizures Mother    Glaucoma Mother    CAD Father    Heart disease Father    Hypertension Father    Social History:   reports that she has quit smoking. She has never used smokeless tobacco. She reports previous  alcohol use. She reports that she does not use drugs.  Medications Current Outpatient Medications  Medication Instructions   acetaminophen (TYLENOL) 650 mg, Oral, Every 6 hours PRN   amLODipine (NORVASC) 10 mg, Oral, Daily   budesonide (ENTOCORT EC) 3 MG 24 hr capsule TAKE 3 CAPSULES BY MOUTH ONCE DAILY   buPROPion (WELLBUTRIN SR) 200 mg, Oral, Daily   Calcium 200 mg, Oral, Daily   cholecalciferol (VITAMIN D3) 1,000 Units, Oral, Daily   cycloSPORINE (RESTASIS) 0.05 % ophthalmic emulsion 1 drop, Both Eyes, 2 times daily   Dexilant 60 mg, Oral, Daily   Dextran 70-Hypromellose 0.1-0.3 % SOLN 1 drop, Both Eyes, 4 times daily   diphenoxylate-atropine (LOMOTIL) 2.5-0.025 MG tablet TAKE 1 TABLET BY MOUTH 4 TIMES DAILY AS NEEDED FOR DIARRHEA OR  LOOSE  STOOLS   DULoxetine (CYMBALTA) 90 mg, Oral, Daily   estradiol (ESTRACE) 2 mg, Oral, Daily   hydrALAZINE (APRESOLINE) 75 mg, Oral, 3 times daily   HYDROmorphone (DILAUDID) 4 mg, Oral, 4 times daily PRN   lipase/protease/amylase (CREON) 36,000 Units, Oral, 3 times daily PRN   loperamide (IMODIUM) 2 mg, Oral, As needed   methadone (DOLOPHINE) 5 mg, Oral, 5 times daily   metoprolol tartrate (LOPRESSOR) 100 mg, Oral, 2 times daily   Multiple Vitamins-Minerals (MULTIVITAMIN ADULT PO) 1 tablet, Oral, Daily   NARCAN 4 MG/0.1ML LIQD nasal spray kit 1 spray, Nasal,  Once   PRESCRIPTION MEDICATION 1 each, Intravenous, Daily, Home TPN . [facility] . 1 bag for 12 hours.<BR>[phone]   Probiotic Product (PROBIOTIC-10 PO) 1 capsule, Oral, Daily   promethazine (PHENERGAN) 25 mg, Oral, Every 6 hours PRN   sodium chloride 0.9 % infusion Intravenous   sucralfate (CARAFATE) 1 g, Oral, 3 times daily with meals & bedtime   Trace Minerals Cu-Mn-Se-Zn (TRALEMENT IV) 1 mL, Intravenous, Used in hydration bag 4 times weekly    vitamin B-12 (CYANOCOBALAMIN) 1,000 mcg, Oral, Daily   Exam: Current vital signs: BP  (!) 154/78    Pulse 80    Temp 100 F (37.8 C) (Oral)    Resp 17    Ht 5' 8"  (1.727 m)    Wt 61.2 kg    SpO2 93%    BMI 20.51 kg/m  Vital signs in last 24 hours: Temp:  [98.4 F (36.9 C)-101.4 F (38.6 C)] 100 F (37.8 C) (01/21 0547) Pulse Rate:  [76-110] 80 (01/21 0630) Resp:  [14-31] 17 (01/21 0630) BP: (133-207)/(64-113) 154/78 (01/21 0900) SpO2:  [89 %-100 %] 93 % (01/21 0630) Weight:  [61.2 kg] 61.2 kg (01/20 1332)  GENERAL: Awake, alert, slightly restless sitting up in bed, no acute distress Head: Normocephalic, atraumatic  EENT: - No OP obstruction, dry mucous membranes LUNGS - Normal respiratory effort, symmetric chest rise with respirations CV - extremities warm and without edema ABDOMEN - Soft, non-distended Ext: warm, well perfused  NEURO:  Mental Status: alert and oriented to person, place, age, month, and situation. Speech is without dysarthria. No aphasia noted. Patient  follows commands. She is able to give a clear history but remains amnestic to the events leading to current hospitalization. Repetition is not intact; consistently omits multiple words of a phrase. Comprehension is not intact. Naming intact.   Cranial Nerves:  II: PERRL 4 mm/brisk. Visual fields full without diplopia. No ptosis noted.   III, IV, VI: EOMI. Lid elevation symmetric and full.  V: Sensation to light touch symmetric in the face.  VII: Face is symmetric resting and smiling.  VIII: Hearing intact to voice IX, X: Palate elevation is symmetric. Phonation intact.   XI: Normal sternocleidomastoid and trapezius muscle strength. Shoulder shrug symmetric.  XII: Tongue protrudes midline.  Motor: 5/5 strength present in all muscle groups. Tone and bulk are normal. All extremities with antigravity movement without drift.  Sensation- Intact to light touch bilaterally in all four extremities without neglect.  Coordination: FTN intact bilaterally. HKS intact bilaterally. No pronator drift.  DTRs: 2+  throughout.  Gait- deferred  Labs I have reviewed labs in epic and the results pertinent to this consultation are: CBC    Component Value Date/Time   WBC 8.1 12/16/2020 0320   RBC 2.91 (L) 12/16/2020 0320   HGB 8.8 (L) 12/16/2020 0320   HCT 26.8 (L) 12/16/2020 0320   PLT 171 12/16/2020 0320   MCV 92.1 12/16/2020 0320   MCH 30.2 12/16/2020 0320   MCHC 32.8 12/16/2020 0320   RDW 14.5 12/16/2020 0320   LYMPHSABS 0.7 12/16/2020 0320   MONOABS 0.3 12/16/2020 0320   EOSABS 0.0 12/16/2020 0320   BASOSABS 0.0 12/16/2020 0320   CMP     Component Value Date/Time   NA 134 (L) 12/16/2020 0320   K 4.2 12/16/2020 0320   CL 107 12/16/2020 0320   CO2 19 (L) 12/16/2020 0320   GLUCOSE 75 12/16/2020 0320   BUN 32 (H) 12/16/2020 0320   CREATININE 1.39 (H) 12/16/2020 0320   CALCIUM 9.2 12/16/2020 0320   PROT 6.6 12/16/2020 0320   ALBUMIN 3.1 (L) 12/16/2020 0320   AST 24 12/16/2020 0320   ALT 22 12/16/2020 0320   ALKPHOS 184 (H) 12/16/2020 0320   BILITOT 0.9 12/16/2020 0320   GFRNONAA 44 (L) 12/16/2020 0320   GFRAA 55 (L) 08/12/2020 0513    Lipid Panel     Component Value Date/Time   TRIG 72 12/16/2020 0320     Imaging I have reviewed the images obtained:  CT-scan of the brain IMPRESSION: No evidence of acute intracranial abnormality. Moderate for age nonspecific cerebral white matter disease. Mild paranasal sinus mucosal thickening.  MRI examination of the brain with possible cortical involvement of microvascular ischemic disease- potential for seizures with cortical involvement IMPRESSION:  1. No acute intracranial abnormality. 2. Mild to moderate cerebral white matter changes, nonspecific, but most likely related to chronic microvascular ischemic disease, stable.  Assessment: Ms. Devore is a 60 year old female who presented to [facility] 12/15/20 following 1-2 minutes of witnessed seizure-like activity at home.   Recommendations: - EEG suggestive of mild diffuse encephalopathy  without seizures or epileptiform discharges - Discontinue home Wellbutrin as it may lower seizure threshold - Start Keppra 500 mg PO BID  - Inpatient seizure precautions; IV Ativan PRN for seizure lasting longer than 5 minutes - Outpatient seizure precautions:Per [facility] statutes, patients with seizures are not allowed to drive until they have been seizure-free for six months.  Use caution when using heavy equipment or power tools. Avoid working on ladders or at heights. Take showers instead of  baths. Ensure the water temperature is not too high on the home water heater. Do not go swimming alone. Do not lock yourself in a room alone (i.e. bathroom). When caring for infants or small children, sit down when holding, feeding, or changing them to minimize risk of injury to the child in the event you have a seizure. Maintain good sleep hygiene. Avoid alcohol.   Pt seen by NP/Neuro and later by MD. Note/plan to be edited by MD as needed.  Anibal Henderson, AGAC-NP [facility] Pager: [phone]    I have seen the patient and reviewed the above note.  The description of the current event is very consistent with seizure, and with a history of a previous seizure, there is consideration of antiepileptic therapy.  She is on Wellbutrin which lowers seizure threshold.  On discussing other episodes, her daughter relates that she has frequent staring spells, approximately 1/week.  She will become unresponsive, staring into space despite her attempt to get the attention of Ms. Aline Brochure.  She is sometimes incontinent of urine when she is "confused because of the spell."  Without description concerning for partial seizures as well as juxtacortical involvement of her microvascular changes, I am concerned enough that I think she would benefit from antiepileptic therapy.  I will start Keppra 500 mg twice daily and discontinue wellbutrin.   Neurology will be available as needed. Please call  with any further episodes or concerns.   Roland Rack, MD [facility] [phone]  If 7pm- 7am, please page neurology on call as listed in [place].

## 2020-12-16 NOTE — Progress Notes (Signed)
PHARMACY - TOTAL PARENTERAL NUTRITION CONSULT NOTE   Indication: Short bowel syndrome  Patient Measurements: Height: _0  (172.7 cm) Weight: 61.2 kg (134 lb 14.7 oz) IBW/kg (Calculated) : 63.9 TPN AdjBW (KG): 61.2 Body mass index is 20.51 kg/m.  Assessment:  86 yoF, hx of Short gut syndrome on chronic cyclic TPN, can tolerate po. Recent admit and ED visits with Covid PNA. Now presenting with possible seizure. Plan is to admit for observation. Pharmacy to resume TPN.  Glucose / Insulin: CBGs < 150 Electrolytes: wnl Renal: CKD. SCr ok at 1.39 LFTs / TGs: wnl exc Alk Phos 184 Prealbumin / albumin: Pre-albumin slightly low at 26.4 Intake / Output; MIVF: LR at 61m/hr GI Imaging: CT shows evidence of prior cholecystectomy and hysterectomy. Surgeries / Procedures:   Central access: CVC double lumen TPN start date: Chronic TPN  Nutritional Goals (per RD recommendation on **): kCal: ~1,500, Protein: ~85g, Fluid: ~1.8 L  Chronic PTA TPN is from [facility]  Infuse 1800 mL over 12 hrs. Provides 85g of amino acids, 230g of dextrose, and SMOFlipid 40g only 3x/week  Current Nutrition:  Heart diet (unsure of absorption)  Plan:  Infuse 15020mover 18 hrs: 44 mL/hr x 1 hr, then 88 mL/hr x 16 hrs, then 44 mL/hr x 1 hr. TTSS TPN will provide 87 g of protein, 240 g of dextrose, and 0 g of lipids for a total of 1164 kcals MWF TPN will provide 87 g of protein, 240 g of dextrose, and 50 g of lipids for a total of 1,660 kcals Electrolytes in TPN: 10052mL of Na, 35m39m of K, 0mEq3mof Ca, 5mEq/71mf Mg, and 15mmol42mf Phos. Cl:Ac ratio 1:2 Add standard MVI and trace elements to TPN Check CBGs 3x per day around cyclic TPN Decrease LR to 10ml/hr81might to maintain ~1.8 L of fluid per day Monitor TPN labs on Mon/Thurs, bmet tomorrow  Due to national lipid emulsion shortage, will hold lipids tonight and reasses on Monday if not discharged F/U RD recs  Caitlin Peters BElenor Peters, BCPS,  BCIDP Clinical Pharmacist 12/16/2020 10:21 AM

## 2020-12-16 NOTE — Progress Notes (Signed)
Initial Nutrition Assessment  RD working remotely.  DOCUMENTATION CODES:   Not applicable  INTERVENTION:  - TPN initiation and management per Pharmacist. - complete NFPE when feasible.   NUTRITION DIAGNOSIS:   Increased nutrient needs related to acute illness,catabolic illness (ZYSAY-30 infection) as evidenced by estimated needs  GOAL:   Patient will meet greater than or equal to 90% of their needs  MONITOR:   PO intake,Labs,Weight trends,Other (Comment) (TPN regimen)  REASON FOR ASSESSMENT:   Consult New TPN/TNA  ASSESSMENT:   60 y.o. female with medical history of Crohn's disease, short gut syndrome on chronic home TPN since 2003, stage 3 CKD, HTN, chronic pain syndrome, and multiple prior line infections. She presented to the ED due a seizure witnessed by her daughter. She was admitted 10 days prior to this admission d/t COVID-19; admitted x3 days. Patient has been vaccinated and boosted against COVID-19.  No intakes documented since admission yesterday evening. Unable to connect with patient via phone. Will need to obtain nutrition-related hx at follow-up.   Patient is on chronic home TPN. Order has been placed for cyclic TPN Z60 hours/day to start today at 1800. This regimen will provide 1660 kcal (87% estimated need) and 87 grams protein (97% estimated need)   Patient was last seen by a [place] RD on 11/08/20 at which time patient reported she does consume items PO at home to include fish and chicken and other easy-to-chew items.   Weight yesterday was 135 lb and weight has been stable for the past 2 months. Weight on 09/19/20 was 142 lb. This indicates 7 lb weight loss (5% body weight) in the past 3 months; not significant for time frame.   Labs reviewed; Na: 134 mmol/l, BUN: 32 mg/dl, creatinine: 1.39 mg/dl, Alk Phos elevated, GFR: 44 ml/min. Medications reviewed; 40 mg oral protonix/day, 1 g carafate TID, 1000 mcg oral cyanocobalamin/day.  IVF; LR @ 75 ml/hr.      NUTRITION - FOCUSED PHYSICAL EXAM:  unable to complete at this time.   Diet Order:   Diet Order            Diet Heart Room service appropriate? Yes; Fluid consistency: Thin  Diet effective now                 EDUCATION NEEDS:   No education needs have been identified at this time  Skin:  Skin Assessment: Reviewed RN Assessment  Last BM:  1/20  Height:   Ht Readings from Last 1 Encounters:  12/15/20 5' 8"  (1.727 m)    Weight:   Wt Readings from Last 1 Encounters:  12/15/20 61.2 kg    Estimated Nutritional Needs:  Kcal:  1900-2100 kcal Protein:  90-105 grams Fluid:  >/= 2.5 L/day      Jarome Matin, MS, RD, LDN, CNSC Inpatient Clinical Dietitian RD pager # available in [facility]  After hours/weekend pager # available in [facility]

## 2020-12-17 ENCOUNTER — Inpatient Hospital Stay (HOSPITAL_COMMUNITY): Payer: Medicare Other

## 2020-12-17 DIAGNOSIS — T80211A Bloodstream infection due to central venous catheter, initial encounter: Secondary | ICD-10-CM | POA: Diagnosis not present

## 2020-12-17 DIAGNOSIS — R7881 Bacteremia: Secondary | ICD-10-CM | POA: Diagnosis not present

## 2020-12-17 DIAGNOSIS — R569 Unspecified convulsions: Secondary | ICD-10-CM | POA: Diagnosis not present

## 2020-12-17 DIAGNOSIS — G894 Chronic pain syndrome: Secondary | ICD-10-CM | POA: Diagnosis not present

## 2020-12-17 DIAGNOSIS — R509 Fever, unspecified: Secondary | ICD-10-CM | POA: Diagnosis not present

## 2020-12-17 DIAGNOSIS — K8050 Calculus of bile duct without cholangitis or cholecystitis without obstruction: Secondary | ICD-10-CM | POA: Diagnosis not present

## 2020-12-17 DIAGNOSIS — F419 Anxiety disorder, unspecified: Secondary | ICD-10-CM | POA: Diagnosis not present

## 2020-12-17 DIAGNOSIS — N1832 Chronic kidney disease, stage 3b: Secondary | ICD-10-CM

## 2020-12-17 LAB — CBC WITH DIFFERENTIAL/PLATELET
Abs Immature Granulocytes: 0.01 10*3/uL (ref 0.00–0.07)
Basophils Absolute: 0 10*3/uL (ref 0.0–0.1)
Basophils Relative: 0 %
Eosinophils Absolute: 0.1 10*3/uL (ref 0.0–0.5)
Eosinophils Relative: 1 %
HCT: 24.2 % — ABNORMAL LOW (ref 36.0–46.0)
Hemoglobin: 7.9 g/dL — ABNORMAL LOW (ref 12.0–15.0)
Immature Granulocytes: 0 %
Lymphocytes Relative: 29 %
Lymphs Abs: 1.2 10*3/uL (ref 0.7–4.0)
MCH: 29.8 pg (ref 26.0–34.0)
MCHC: 32.6 g/dL (ref 30.0–36.0)
MCV: 91.3 fL (ref 80.0–100.0)
Monocytes Absolute: 0.4 10*3/uL (ref 0.1–1.0)
Monocytes Relative: 10 %
Neutro Abs: 2.3 10*3/uL (ref 1.7–7.7)
Neutrophils Relative %: 60 %
Platelets: 162 10*3/uL (ref 150–400)
RBC: 2.65 MIL/uL — ABNORMAL LOW (ref 3.87–5.11)
RDW: 14.1 % (ref 11.5–15.5)
WBC: 4 10*3/uL (ref 4.0–10.5)
nRBC: 0 % (ref 0.0–0.2)

## 2020-12-17 LAB — PHOSPHORUS: Phosphorus: 2.7 mg/dL (ref 2.5–4.6)

## 2020-12-17 LAB — URINE CULTURE: Culture: NO GROWTH

## 2020-12-17 LAB — COMPREHENSIVE METABOLIC PANEL
ALT: 18 U/L (ref 0–44)
AST: 18 U/L (ref 15–41)
Albumin: 2.6 g/dL — ABNORMAL LOW (ref 3.5–5.0)
Alkaline Phosphatase: 137 U/L — ABNORMAL HIGH (ref 38–126)
Anion gap: 8 (ref 5–15)
BUN: 25 mg/dL — ABNORMAL HIGH (ref 6–20)
CO2: 24 mmol/L (ref 22–32)
Calcium: 8.6 mg/dL — ABNORMAL LOW (ref 8.9–10.3)
Chloride: 102 mmol/L (ref 98–111)
Creatinine, Ser: 1.28 mg/dL — ABNORMAL HIGH (ref 0.44–1.00)
GFR, Estimated: 48 mL/min — ABNORMAL LOW (ref >60)
Glucose, Bld: 140 mg/dL — ABNORMAL HIGH (ref 70–99)
Potassium: 3.7 mmol/L (ref 3.5–5.1)
Sodium: 134 mmol/L — ABNORMAL LOW (ref 135–145)
Total Bilirubin: 0.5 mg/dL (ref 0.3–1.2)
Total Protein: 5.9 g/dL — ABNORMAL LOW (ref 6.5–8.1)

## 2020-12-17 LAB — PROCALCITONIN: Procalcitonin: 7.44 ng/mL

## 2020-12-17 LAB — ECHOCARDIOGRAM COMPLETE
Area-P 1/2: 2.58 cm2
Height: 68 in
S' Lateral: 2.3 cm
Weight: 2158.74 oz

## 2020-12-17 LAB — C-REACTIVE PROTEIN: CRP: 6.5 mg/dL — ABNORMAL HIGH (ref <1.0)

## 2020-12-17 LAB — D-DIMER, QUANTITATIVE: D-Dimer, Quant: 0.9 ug/mL-FEU — ABNORMAL HIGH (ref 0.00–0.50)

## 2020-12-17 LAB — FERRITIN: Ferritin: 70 ng/mL (ref 11–307)

## 2020-12-17 LAB — GLUCOSE, CAPILLARY
Glucose-Capillary: 161 mg/dL — ABNORMAL HIGH (ref 70–99)
Glucose-Capillary: 122 mg/dL — ABNORMAL HIGH (ref 70–99)
Glucose-Capillary: 103 mg/dL — ABNORMAL HIGH (ref 70–99)

## 2020-12-17 LAB — LACTATE DEHYDROGENASE: LDH: 93 U/L — ABNORMAL LOW (ref 98–192)

## 2020-12-17 LAB — MAGNESIUM: Magnesium: 2 mg/dL (ref 1.7–2.4)

## 2020-12-17 MED ORDER — TRACE MINERALS CU-MN-SE-ZN 300-55-60-3000 MCG/ML IV SOLN
INTRAVENOUS | Status: AC
  Filled 2020-12-17: qty 580

## 2020-12-17 MED ORDER — VANCOMYCIN HCL 1250 MG/250ML IV SOLN
1250.0000 mg | INTRAVENOUS | Status: AC
  Administered 2020-12-17: 1250 mg via INTRAVENOUS
  Filled 2020-12-17: qty 250

## 2020-12-17 MED ORDER — VANCOMYCIN HCL IN DEXTROSE 1-5 GM/200ML-% IV SOLN: 1000.0000 mg | INTRAVENOUS | Status: DC

## 2020-12-17 MED ORDER — TIZANIDINE HCL 4 MG PO TABS
2.0000 mg | ORAL_TABLET | Freq: Three times a day (TID) | ORAL | Status: DC | PRN
  Administered 2020-12-18: 2 mg via ORAL
  Filled 2020-12-17: qty 1

## 2020-12-17 MED ORDER — SODIUM CHLORIDE 0.9 % IV SOLN
500.0000 mg | Freq: Every day | INTRAVENOUS | Status: DC
  Administered 2020-12-17 – 2020-12-20 (×4): 500 mg via INTRAVENOUS
  Filled 2020-12-17 (×5): qty 10

## 2020-12-17 MED ORDER — KETOROLAC TROMETHAMINE 30 MG/ML IJ SOLN: 30.0000 mg | Freq: Once | INTRAMUSCULAR | Status: AC | PRN

## 2020-12-17 NOTE — Progress Notes (Signed)
*  PRELIMINARY RESULTS* Echocardiogram 2D Echocardiogram has been performed.  Caitlin Peters 12/17/2020, 3:48 PM

## 2020-12-17 NOTE — Progress Notes (Signed)
PHARMACY - TOTAL PARENTERAL NUTRITION CONSULT NOTE   Indication: Short bowel syndrome  Patient Measurements: Height: 5' 8"  (172.7 cm) Weight: 61.2 kg (134 lb 14.7 oz) IBW/kg (Calculated) : 63.9 TPN AdjBW (KG): 61.2 Body mass index is 20.51 kg/m.  Assessment:  67 yoF, hx of Short gut syndrome on chronic cyclic TPN, can tolerate po. Recent admit and ED visits with Covid PNA. Now presenting with possible seizure. Plan is to admit for observation. Pharmacy to resume TPN.  Glucose / Insulin: CBGs < 150, 3 units past 24 hr Electrolytes: wnl Renal: CKD. SCr ok at 1.28 LFTs / TGs: wnl exc Alk Phos 184 Prealbumin / albumin: Pre-albumin slightly low at 26.4 Intake / Output; MIVF: LR at ml/hr GI Imaging: CT shows evidence of prior cholecystectomy and hysterectomy. Surgeries / Procedures:   Central access: CVC double lumen TPN start date: Chronic TPN  Nutritional Goals (per RD recommendation on **): kCal: ~1,500, Protein: ~85g, Fluid: ~1.8 L  Chronic PTA TPN is from [place].  Infuse 1800 mL over 12 hrs. Provides 85g of amino acids, 230g of dextrose, and SMOFlipid 40g only 3x/week  Current Nutrition:  Heart diet (unsure of absorption)  Plan:  Infuse 1551m over 18 hrs: 44 mL/hr x 1 hr, then 88 mL/hr x 16 hrs, then 44 mL/hr x 1 hr. TTSS TPN will provide 87 g of protein, 240 g of dextrose, and 0 g of lipids for a total of 1164 kcals MWF TPN will provide 87 g of protein, 240 g of dextrose, and 50 g of lipids for a total of 1,660 kcals Electrolytes in TPN: 1031m/L of Na, 4077mL of K, 0mE60m of Ca, 5mEq34mof Mg, and 15mmo15mof Phos. Cl:Ac ratio 1:2 Add standard MVI and trace elements to TPN Check CBGs 3x per day around cyclic TPN Decrease LR to 10ml/h70mnight to maintain ~1.8 L of fluid per day Monitor TPN labs on Mon/Thurs, bmet tomorrow  Due to national lipid emulsion shortage, will hold lipids tonight and reasses on Monday if not discharged F/U RD recs  Geniva Lohnes, Minda DittoD 12/17/2020, 10:13 AM

## 2020-12-17 NOTE — Progress Notes (Signed)
PROGRESS NOTE    Caitlin Peters  XBW:[id number] DOB: July 29, 1961 DOA: 12/15/2020 PCP: [name], [name], MD     Brief Narrative:  Caitlin Peters is a 60 y.o. BF PMHx depression, acute pancreatitis, choledocholithiasis, Crohn's dz, short gut syndrome, CKD stage III, HTN, on chronic TPN, chronic pain syndrome, multiple prior line infections due to being on TPN since 2003.  Mass in chest, drug-seeking behavior  Pt presents to ED with apparent witnessed seizure per daughter.  Pt initially confused but now mentating normally.  Pt was just discharged from hospital for incidental COVID 19 infection 12/08/2020 (received 3-day course remdesivir).   Thankfully mild COVID symptoms (probably due to vaccinated+boosted status), pt discharged 3 days later.  Denies cough, SOB.  Endorses nausea and abd discomfort (chronic)  Per daughter last had seizure ~1 decade ago.    Subjective: 1/22 afebrile overnight A/O x4, patient demanding IV Dilaudid along with her regular pain medication.  States she fell back in the summertime and injured her back and is now having a flare.  Refuses to try any other medication stating it does not work.   Assessment & Plan: Covid vaccination;   Principal Problem:   Seizure ([place]) Active Problems:   Chronic diarrhea   Short gut syndrome   Depression   Chronic pain syndrome   On total parenteral nutrition (TPN)   Anxiety   Avascular necrosis ([name])   Choledocholithiasis (sludge) s/p ERCP 10/2019   Methadone dependence ([name])   CKD (chronic kidney disease) stage 3, GFR 30-59 ml/min ([name])   COVID-19 virus infection   Drug-seeking behavior  SIRS -On admission meets criteria for SIRS temp> 38 C, RR> 20 -1/21 procalcitonin pending - 1/21 PCXR pending - Lactic acid= 0.5  Bacteremia positive Staph Epidermidis - 1/22 echocardiogram pending - 1/22 per ID will DC Vancomycin and changed to Daptomycin most likely for at least 2 weeks of treatment. -  ID will actually see patient in the A.m.   Seizure - Per neurology recommendation: -1/21 EEG pending -1/21 Wellbutrin 200 mg daily (hold) may lower seizure threshold -1/21 UDS positive opiates: Patient on opiates at home - Inpatient seizure precautions; IV Ativan PRN for seizure lasting longer than 5 minutes - Outpatient seizure precautions:Per [facility] statutes, patients with seizures are not allowed to drive until they have been seizure-free for six months.  Use caution when using heavy equipment or power tools. Avoid working on ladders or at heights. Take showers instead of baths. Ensure the water temperature is not too high on the home water heater. Do not go swimming alone. Do not lock yourself in a room alone (i.e. bathroom). When caring for infants or small children, sit down when holding, feeding, or changing them to minimize risk of injury to the child in the event you have a seizure. Maintain good sleep hygiene. Avoid alcohol.  -1/22 neurology feels true seizure captured on EEG -  COVID infection COVID-19 Labs  Recent Labs    12/16/20 1215 12/17/20 0500  DDIMER 1.34* 0.90*  FERRITIN 68 70  LDH 93* 93*  CRP 7.3* 6.5*    Lab Results  Component Value Date   SARSCOV2NAA POSITIVE (A) 12/05/2020   [place] NEGATIVE 11/07/2020   [place] NEGATIVE 10/21/2020   [place] NEGATIVE 09/01/2020  -Patient was treated with Remdesivir x3 days and discharged on 12/08/2020 - Patient will be contagious until 12/26/2020, while in the hospital should be placed in airborne protection.  Short gut syndrome - Continue TPN  Chronic  pain syndrome -Continue methadone and Dilaudid p.o. - 1/22 states that she fell in June and injured her back and now is flaring up and whenever she comes in the hospital she is given IV Dilaudid along with her other p.o. Dilaudid and methadone. - 1/22 Zanaflex PRN must be judicious secondary to CKD stage III -1/22 Toradol IV 30 mg x 1  PRN  Drug-seeking behavior - Follow patient on methadone, Dilaudid p.o. and states that whenever she comes in the hospital they gave her IV Dilaudid in addition to her p.o. medication. -Patient becomes very agitated when informed will not mix IV Dilaudid with p.o. Dilaudid and p.o. methadone  CKD stage III (baseline Cr ave 1.28) Lab Results  Component Value Date   CREATININE 1.28 (H) 12/17/2020   CREATININE 1.35 (H) 12/16/2020   CREATININE 1.39 (H) 12/16/2020   CREATININE 1.44 (H) 12/15/2020   CREATININE 1.22 (H) 12/07/2020  -Strict in and out -Daily weight -At baseline         DVT prophylaxis: Lovenox Code Status: Full Family Communication:  Status is: Inpatient    Dispo: The patient is from: Home              Anticipated d/c is to: Home              Anticipated d/c date is: 1/23              Patient currently unstable      Consultants:  Neurology ID  Procedures/Significant Events:  EEG pending    I have personally reviewed and interpreted all radiology studies and my findings are as above.  VENTILATOR SETTINGS:    Cultures 1/20 blood PICC line positive 2/2 staph epidermidis 1/20 blood LEFT arm positive 2/2 staph epidermidis     Antimicrobials: Anti-infectives (From admission, onward)   Start     Ordered Stop   12/18/20 0200  vancomycin (VANCOCIN) IVPB 1000 mg/200 mL premix  Status:  Discontinued        12/17/20 0103 12/17/20 1643   12/17/20 2000  DAPTOmycin (CUBICIN) 500 mg in sodium chloride 0.9 % IVPB        12/17/20 1627     12/17/20 0130  vancomycin (VANCOREADY) IVPB 1250 mg/250 mL        12/17/20 0103 12/17/20 0409       Devices    LINES / TUBES:      Continuous Infusions: . lactated ringers 10 mL/hr at 12/16/20 2220  . TPN CYCLIC-ADULT (ION)    . [START ON 12/18/2020] vancomycin       Objective: Vitals:   12/16/20 1027 12/16/20 1502 12/16/20 2210 12/17/20 0657  BP: (!) 161/78 (!) 156/67 (!) 149/65 (!) 162/85  Pulse:  77 67 68 72  Resp: 19 19 18 16   Temp: 98.9 F (37.2 C) 99.7 F (37.6 C) 98.7 F (37.1 C) 98.7 F (37.1 C)  TempSrc: Oral Oral Oral Oral  SpO2: 100% 100% 99% 97%  Weight:      Height:        Intake/Output Summary (Last 24 hours) at 12/17/2020 1201 Last data filed at 12/17/2020 1108 Gross per 24 hour  Intake 2118.9 ml  Output -  Net 2118.9 ml   Filed Weights   12/15/20 1332  Weight: 61.2 kg    Examination:  General: A/O x4 No acute respiratory distress Eyes: negative scleral hemorrhage, negative anisocoria, negative icterus ENT: Negative Runny nose, negative gingival bleeding, Neck:  Negative scars, masses, torticollis, lymphadenopathy, JVD Lungs: Clear  to auscultation bilaterally without wheezes or crackles Cardiovascular: Regular rate and rhythm without murmur gallop or rub normal S1 and S2 Abdomen: negative abdominal pain, nondistended, positive soft, bowel sounds, no rebound, no ascites, no appreciable mass Extremities: No significant cyanosis, clubbing, or edema bilateral lower extremities, RIGHT femoral CVL negative sign of discharge, negative erythema, negative rubor. Skin: Negative rashes, lesions, ulcers Psychiatric:  Negative depression, negative anxiety, negative fatigue, negative mania  Central nervous system:  Cranial nerves II through XII intact, tongue/uvula midline, all extremities muscle strength 5/5, sensation intact throughout, negative dysarthria, negative expressive aphasia, negative receptive aphasia.  .     Data Reviewed: Care during the described time interval was provided by me .  I have reviewed this patient's available data, including medical history, events of note, physical examination, and all test results as part of my evaluation.  CBC: Recent Labs  Lab 12/15/20 1517 12/16/20 0320 12/16/20 1215 12/17/20 0500  WBC 7.6 8.1 5.1 4.0  NEUTROABS 6.7 7.1 4.0 2.3  HGB 9.3* 8.8* 8.0* 7.9*  HCT 28.8* 26.8* 24.3* 24.2*  MCV 94.1 92.1 91.7 91.3   PLT 191 171 147* 161   Basic Metabolic Panel: Recent Labs  Lab 12/15/20 0106 12/15/20 1517 12/16/20 0320 12/16/20 1215 12/17/20 0500  NA  --  136 134* 134* 134*  K  --  4.4 4.2 3.6 3.7  CL  --  109 107 104 102  CO2  --  18* 19* 21* 24  GLUCOSE  --  86 75 117* 140*  BUN  --  46* 32* 28* 25*  CREATININE  --  1.44* 1.39* 1.35* 1.28*  CALCIUM  --  9.1 9.2 8.8* 8.6*  MG 2.2  --  2.0 1.9 2.0  PHOS  --   --  3.6 3.3 2.7   GFR: Estimated Creatinine Clearance: 45.7 mL/min (A) (by C-G formula based on SCr of 1.28 mg/dL (H)). Liver Function Tests: Recent Labs  Lab 12/15/20 1517 12/16/20 0320 12/16/20 1215 12/17/20 0500  AST 26 24 22 18   ALT 24 22 21 18   ALKPHOS 184* 184* 158* 137*  BILITOT 1.0 0.9 0.8 0.5  PROT 7.4 6.6 6.2* 5.9*  ALBUMIN 3.5 3.1* 2.9* 2.6*   No results for input(s): LIPASE, AMYLASE in the last 168 hours. No results for input(s): AMMONIA in the last 168 hours. Coagulation Profile: No results for input(s): INR, PROTIME in the last 168 hours. Cardiac Enzymes: No results for input(s): CKTOTAL, CKMB, CKMBINDEX, TROPONINI in the last 168 hours. BNP (last 3 results) No results for input(s): PROBNP in the last 8760 hours. HbA1C: No results for input(s): HGBA1C in the last 72 hours. CBG: Recent Labs  Lab 12/11/20 1446 12/16/20 2207 12/17/20 0804  GLUCAP 82 141* 161*   Lipid Profile: Recent Labs    12/16/20 0320  TRIG 72   Thyroid Function Tests: Recent Labs    12/15/20 0106  TSH 0.362   Anemia Panel: Recent Labs    12/16/20 1215 12/17/20 0500  FERRITIN 68 70   Sepsis Labs: Recent Labs  Lab 12/15/20 1517 12/16/20 1215 12/17/20 0500  PROCALCITON  --  9.53 7.44  LATICACIDVEN 0.5  --   --     Recent Results (from the past 240 hour(s))  Culture, blood (routine x 2)     Status: Abnormal (Preliminary result)   Collection Time: 12/15/20  1:06 AM   Specimen: BLOOD  Result Value Ref Range Status   Specimen Description   Final    BLOOD  LEFT ARM Performed at [facility], [place] [address], [place], [place] [address]    Special Requests   Final    BOTTLES DRAWN AEROBIC AND ANAEROBIC Blood Culture adequate volume Performed at [place] [address] [place], [place] [address]    Culture  Setup Time   Final    IN BOTH AEROBIC AND ANAEROBIC BOTTLES GRAM POSITIVE COCCI CRITICAL VALUE NOTED.  VALUE IS CONSISTENT WITH PREVIOUSLY REPORTED AND CALLED VALUE. Performed at [facility], [place] [address] [place], [place] [address]    Culture STAPHYLOCOCCUS EPIDERMIDIS (A)  Final   Report Status PENDING  Incomplete  Urine culture     Status: None   Collection Time: 12/15/20  5:38 PM   Specimen: Urine, Clean Catch  Result Value Ref Range Status   Specimen Description   Final    URINE, CLEAN CATCH Performed at [facility], [place] [address], [place], [place] [address]    Special Requests   Final    NONE Performed at [facility], [place] [address], [place], [place] [address]    Culture   Final    NO GROWTH Performed at [facility], [place] [address] [place], [place] [address]    Report Status 12/17/2020 FINAL  Final  Culture, blood (routine x 2)     Status: Abnormal (Preliminary result)   Collection Time: 12/15/20  7:16 PM   Specimen: BLOOD  Result Value Ref Range Status   Specimen Description   Final    BLOOD PICC LINE Performed at [place] [address], [place], [place] [address]    Special Requests   Final    BOTTLES DRAWN AEROBIC AND ANAEROBIC Blood Culture adequate volume Performed at [place] [address] [place], [place] [address]    Culture  Setup Time   Final    ANAEROBIC BOTTLE ONLY GRAM POSITIVE COCCI Organism ID to follow CRITICAL RESULT CALLED TO, READ BACK BY AND VERIFIED WITH: Elenore Paddy [facility] 12/16/20 2330 JDW Performed at [facility], [address]  [place],  [address]    Culture STAPHYLOCOCCUS EPIDERMIDIS (A)  Final   Report Status PENDING  Incomplete  Blood Culture ID Panel (Reflexed)     Status: Abnormal   Collection Time: 12/15/20  7:16 PM  Result Value Ref Range Status   Enterococcus faecalis NOT DETECTED NOT DETECTED Final   Enterococcus Faecium NOT DETECTED NOT DETECTED Final   Listeria monocytogenes NOT DETECTED NOT DETECTED Final   Staphylococcus species DETECTED (A) NOT DETECTED Final    Comment: CRITICAL RESULT CALLED TO, READ BACK BY AND VERIFIED WITH: L POINDEXTER PHARMD 12/16/20 2330 JDW    Staphylococcus aureus (BCID) NOT DETECTED NOT DETECTED Final   Staphylococcus epidermidis DETECTED (A) NOT DETECTED Final    Comment: Methicillin (oxacillin) resistant coagulase negative staphylococcus. Possible blood culture contaminant (unless isolated from more than one blood culture draw or clinical case suggests pathogenicity). No antibiotic treatment is indicated for blood  culture contaminants. CRITICAL RESULT CALLED TO, READ BACK BY AND VERIFIED WITH: L POINDEXTER PHARMD 12/16/20 2230 JDW    Staphylococcus lugdunensis NOT DETECTED NOT DETECTED Final   Streptococcus species NOT DETECTED NOT DETECTED Final   Streptococcus agalactiae NOT DETECTED NOT DETECTED Final   Streptococcus pneumoniae NOT DETECTED NOT DETECTED Final   Streptococcus pyogenes NOT DETECTED NOT DETECTED Final   A.calcoaceticus-baumannii NOT DETECTED NOT DETECTED Final   Bacteroides fragilis  NOT DETECTED NOT DETECTED Final   Enterobacterales NOT DETECTED NOT DETECTED Final   Enterobacter cloacae complex NOT DETECTED NOT DETECTED Final   Escherichia coli NOT DETECTED NOT DETECTED Final   Klebsiella aerogenes NOT DETECTED NOT DETECTED Final   Klebsiella oxytoca NOT DETECTED NOT DETECTED Final   Klebsiella pneumoniae NOT DETECTED NOT DETECTED Final   Proteus species NOT DETECTED NOT DETECTED Final   Salmonella species NOT DETECTED NOT DETECTED Final    Serratia marcescens NOT DETECTED NOT DETECTED Final   Haemophilus influenzae NOT DETECTED NOT DETECTED Final   Neisseria meningitidis NOT DETECTED NOT DETECTED Final   Pseudomonas aeruginosa NOT DETECTED NOT DETECTED Final   Stenotrophomonas maltophilia NOT DETECTED NOT DETECTED Final   Candida albicans NOT DETECTED NOT DETECTED Final   Candida auris NOT DETECTED NOT DETECTED Final   Candida glabrata NOT DETECTED NOT DETECTED Final   Candida krusei NOT DETECTED NOT DETECTED Final   Candida parapsilosis NOT DETECTED NOT DETECTED Final   Candida tropicalis NOT DETECTED NOT DETECTED Final   Cryptococcus neoformans/gattii NOT DETECTED NOT DETECTED Final   Methicillin resistance mecA/C DETECTED (A) NOT DETECTED Final    Comment: CRITICAL RESULT CALLED TO, READ BACK BY AND VERIFIED WITH: L POINDEXTER PHARMD 12/16/20 2330 JDW Performed at [facility], [address] [place], [place] [address]   Culture, blood (routine x 2)     Status: None (Preliminary result)   Collection Time: 12/16/20 12:15 PM   Specimen: BLOOD  Result Value Ref Range Status   Specimen Description   Final    BLOOD FROM CVC Performed at [place] [address], [place], [place] [address]    Special Requests   Final    BOTTLES DRAWN AEROBIC AND ANAEROBIC Blood Culture adequate volume Performed at [place] [address], [place], [place] [address]    Culture  Setup Time   Final    IN BOTH AEROBIC AND ANAEROBIC BOTTLES GRAM POSITIVE COCCI CRITICAL VALUE NOTED.  VALUE IS CONSISTENT WITH PREVIOUSLY REPORTED AND CALLED VALUE. Performed at [facility], [place] [address] [place], [place] [address]    Culture PENDING  Incomplete   Report Status PENDING  Incomplete         Radiology Studies: CT Head Wo Contrast  Result Date: 12/15/2020 CLINICAL DATA:  Seizure, acute, history of trauma. Additional provided: COVID positive 10 days ago. EXAM: CT HEAD WITHOUT  CONTRAST TECHNIQUE: Contiguous axial images were obtained from the base of the skull through the vertex without intravenous contrast. COMPARISON:  Brain MRI 08/10/2020.  Head CT 08/09/2020. FINDINGS: Brain: Cerebral volume is normal. Moderate for age patchy hypodensity within the cerebral white matter, nonspecific. There is no acute intracranial hemorrhage. No demarcated cortical infarct. No extra-axial fluid collection. No evidence of intracranial mass. No midline shift. Vascular: No hyperdense vessel.  Atherosclerotic calcifications. Skull: Normal. Negative for fracture or focal lesion. Sinuses/Orbits: Visualized orbits show no acute finding. Mild mucosal thickening at the imaged levels, most notably ethmoidal. IMPRESSION: No evidence of acute intracranial abnormality. Moderate for age nonspecific cerebral white matter disease. Mild paranasal sinus mucosal thickening. Electronically Signed   By: Kellie Simmering DO   On: 12/15/2020 18:03   MR BRAIN WO CONTRAST  Result Date: 12/15/2020 CLINICAL DATA:  Initial evaluation for acute seizure, COVID positive. EXAM: MRI HEAD WITHOUT CONTRAST TECHNIQUE: Multiplanar, multiecho pulse sequences of the brain and surrounding structures were obtained without intravenous contrast. COMPARISON:  Prior CT from earlier  the same day as well as previous brain MRI from 08/10/2020. FINDINGS: Brain: Age-appropriate cerebral volume loss. Again seen is patchy T2/FLAIR hyperintensity involving the periventricular, deep, and subcortical white matter both cerebral hemispheres, nonspecific, but most like related chronic microvascular ischemic disease. Appearance is stable from previous. No abnormal foci of restricted diffusion to suggest acute or subacute ischemia or changes related to seizure. Gray-white matter differentiation maintained. No encephalomalacia to suggest chronic cortical infarction. No foci of susceptibility artifact to suggest acute or chronic intracranial hemorrhage. No mass  lesion, midline shift or mass effect. No hydrocephalus or extra-axial fluid collection. Pituitary gland suprasellar region normal. Midline structures intact. No definite intrinsic temporal lobe abnormality. Vascular: Major intracranial vascular flow voids are maintained. Skull and upper cervical spine: Craniocervical junction within normal limits. Bone marrow signal intensity normal. No scalp soft tissue abnormality. Sinuses/Orbits: Globes and orbital soft tissues within normal limits. Scattered mucosal thickening noted within the ethmoidal air cells and maxillary sinuses. Paranasal sinuses are otherwise clear. Trace fluid signal noted within the mastoid air cells bilaterally, of doubtful significance. Inner ear structures grossly normal. Other: None. IMPRESSION: 1. No acute intracranial abnormality. 2. Mild to moderate cerebral white matter changes, nonspecific, but most likely related to chronic microvascular ischemic disease, stable. Electronically Signed   By: Jeannine Boga M.D.   On: 12/15/2020 22:45   CT Abdomen Pelvis W Contrast  Result Date: 12/15/2020 CLINICAL DATA:  Abdominal pain. EXAM: CT ABDOMEN AND PELVIS WITH CONTRAST TECHNIQUE: Multidetector CT imaging of the abdomen and pelvis was performed using the standard protocol following bolus administration of intravenous contrast. CONTRAST:  35m OMNIPAQUE IOHEXOL 300 MG/ML  SOLN COMPARISON:  November 11, 2020 FINDINGS: Lower chest: No acute abnormality. Hepatobiliary: No focal liver abnormality is seen. A stable amount of pneumobilia is noted within the right lobe of the liver. Status post cholecystectomy. No biliary dilatation. Pancreas: There is mild pancreatic duct dilatation. The pancreas is otherwise unremarkable. Spleen: Normal in size without focal abnormality. Adrenals/Urinary Tract: Adrenal glands are unremarkable. Kidneys are normal, without renal calculi, focal lesion, or hydronephrosis. Bladder is unremarkable. Stomach/Bowel: Stomach  is within normal limits. The appendix is not identified. Surgically anastomosed bowel is seen throughout the abdomen. There is no evidence of bowel dilatation. Vascular/Lymphatic: A right common femoral central venous catheter is again seen which is unchanged in position. No additional significant vascular findings are present. No enlarged abdominal or pelvic lymph nodes. Reproductive: Status post hysterectomy. No adnexal masses. Other: No abdominal wall hernia or abnormality. No abdominopelvic ascites. Musculoskeletal: No acute or significant osseous findings. IMPRESSION: 1. Evidence of prior cholecystectomy with stable pneumobilia. 2. Status post cholecystectomy and hysterectomy. 3. Stable right common femoral central venous catheter positioning. Electronically Signed   By: TVirgina NorfolkM.D.   On: 12/15/2020 18:25   DG CHEST PORT 1 VIEW  Result Date: 12/16/2020 CLINICAL DATA:  Possible seizure activity, COVID positive 10 days ago EXAM: PORTABLE CHEST 1 VIEW COMPARISON:  12/15/2020 FINDINGS: Stable postop clips over the right chest. Stable mild cardiomegaly without CHF or edema. No focal pneumonia, collapse or consolidation. Negative for effusion or pneumothorax. Trachea midline. Skin fold over the right mid chest noted. IMPRESSION: Stable exam.  No active chest disease. Electronically Signed   By: MJerilynn Mages  Shick M.D.   On: 12/16/2020 11:21   DG Chest Port 1 View  Result Date: 12/15/2020 CLINICAL DATA:  Shortness of breath, COVID positive 10 days ago. EXAM: PORTABLE CHEST 1 VIEW COMPARISON:  Chest x-ray 12/05/2020 FINDINGS: The  heart size and mediastinal contours are stable. No focal consolidation. No pulmonary edema. No pleural effusion. No pneumothorax. No acute osseous abnormality. IMPRESSION: No active disease. Electronically Signed   By: Iven Finn M.D.   On: 12/15/2020 15:28   EEG adult  Result Date: 12/16/2020 Lora Havens, MD     12/16/2020  4:07 PM Patient Name: LYSHA SCHRADE MRN:  [id number] Epilepsy Attending: Lora Havens Referring Physician/Provider: Dr. Dia Crawford Date: 12/16/2020 Duration: 26.31 mins Patient history: 60 year old female who presented with seizures.  EEG to evaluate for seizures. Level of alertness: Awake, drowsy AEDs during EEG study: None Technical aspects: This EEG study was done with scalp electrodes positioned according to the 10-20 International system of electrode placement. Electrical activity was acquired at a sampling rate of 500Hz  and reviewed with a high frequency filter of 70Hz  and a low frequency filter of 1Hz . EEG data were recorded continuously and digitally stored. Description: The posterior dominant rhythm consists of 8.5 Hz activity of moderate voltage (25-35 uV) seen predominantly in posterior head regions, symmetric and reactive to eye opening and eye closing. Drowsiness was characterized by attenuation of the posterior background rhythm. EEG showed intermittent generalized 2-3 hz delta slowing. Physioloic photic driving was not seen during photic stimulation.  Hyperventilation was not performed.   ABNORMALITY -Intermittent slow, generalized IMPRESSION: This study is suggestive of mild diffuse encephalopathy, nonspecific etiology. No seizures or epileptiform discharges were seen throughout the recording. Priyanka Barbra Sarks        Scheduled Meds: . amLODipine  10 mg Oral Daily  . budesonide  3 mg Oral Daily  . Chlorhexidine Gluconate Cloth  6 each Topical Daily  . cycloSPORINE  1 drop Both Eyes BID  . DULoxetine  90 mg Oral Daily  . enoxaparin (LOVENOX) injection  40 mg Subcutaneous Q24H  . estradiol  2 mg Oral Daily  . hydrALAZINE  75 mg Oral TID  . levETIRAcetam  500 mg Oral BID  . methadone  5 mg Oral 5 X Daily  . metoprolol tartrate  100 mg Oral BID  . pantoprazole  40 mg Oral Daily  . polyvinyl alcohol  1 drop Both Eyes QID  . sucralfate  1 g Oral TID WC & HS  . vitamin B-12  1,000 mcg Oral Daily   Continuous Infusions: .  lactated ringers 10 mL/hr at 12/16/20 2220  . TPN CYCLIC-ADULT (ION)    . [START ON 12/18/2020] vancomycin       LOS: 1 day    Time spent:40 min    WOODS, Geraldo Docker, MD [facility] Pager [phone]  If 7PM-7AM, please contact night-coverage [email or web address] Password [facility] 12/17/2020, 12:01 PM

## 2020-12-17 NOTE — Progress Notes (Signed)
Pharmacy Antibiotic Note  Caitlin Peters is a 60 y.o. female admitted on 12/15/2020 with seizures.  Patient known to pharmacy from TPN monitoring.  Patient with +BCID for methicillin resistance staph epidermidis in 3 out of 4 vials.  Pharmacy has been consulted for Vancomycin dosing.  Plan: Vancomycin 1269m IV x 1 followed by Vancomycin 1gm IV q24h Follow renal function Follow final culture sensitivities Monitor vancomycin levels as needed   Height: 5' 8"  (172.7 cm) Weight: 61.2 kg (134 lb 14.7 oz) IBW/kg (Calculated) : 63.9  Temp (24hrs), Avg:99.7 F (37.6 C), Min:98.7 F (37.1 C), Max:101.4 F (38.6 C)  Recent Labs  Lab 12/15/20 1517 12/16/20 0320 12/16/20 1215  WBC 7.6 8.1 5.1  CREATININE 1.44* 1.39* 1.35*  LATICACIDVEN 0.5  --   --     Estimated Creatinine Clearance: 43.4 mL/min (A) (by C-G formula based on SCr of 1.35 mg/dL (H)).    Allergies  Allergen Reactions  . Meperidine Hives    Other reaction(s): GI Upset Due to Chrones   . Hyoscyamine Hives and Swelling    Legs swelling   Disorientation  . Cefepime Other (See Comments)    Neurotoxicity occurring in setting of AKI. Ceftriaxone tolerated during same admit  . Gabapentin Other (See Comments)    unknown  . Lyrica [Pregabalin] Other (See Comments)    unknown  . Topamax [Topiramate] Other (See Comments)    unknown  . Zosyn [Piperacillin Sod-Tazobactam So]     Patient reports it makes her vomit, her neck stiff, and her "heart feel funny"  . Fentanyl Rash    Pt is allergic to fentanyl patch related to the glue (gives her a rash) Pt states she is NOT allergic to fentanyl IV medicine  . Morphine And Related Rash    Antimicrobials this admission: 1/22 Vancomycin >>     Dose adjustments this admission:    Microbiology results: 1/20 BCx: S. Epidermidis meth resis (3/4 vials)  Thank you for allowing pharmacy to be a part of this patient's care.  Caitlin Peters PharmD 12/17/2020 1:06 AM

## 2020-12-17 NOTE — Progress Notes (Signed)
PHARMACY - PHYSICIAN COMMUNICATION CRITICAL VALUE ALERT - BLOOD CULTURE IDENTIFICATION (BCID)  Caitlin Peters is an 60 y.o. female who presented to [facility] on 12/15/2020 with a chief complaint of witnessed seizure  Assessment:  + Staph epidermidis with methicillin resistance detected in 3/4 blood vials (anaerobic and aerobic in 1st set, anaerobic in 2nd set)   Name of physician (or Provider) Contacted: X. Blount, FNP  Current antibiotics: none  Changes to prescribed antibiotics recommended:  Recommendations accepted by provider : begin Vancomycin empirically as organism detected in both culture sets  Results for orders placed or performed during the hospital encounter of 12/15/20  Blood Culture ID Panel (Reflexed) (Collected: 12/15/2020  7:16 PM)  Result Value Ref Range   Enterococcus faecalis NOT DETECTED NOT DETECTED   Enterococcus Faecium NOT DETECTED NOT DETECTED   Listeria monocytogenes NOT DETECTED NOT DETECTED   Staphylococcus species DETECTED (A) NOT DETECTED   Staphylococcus aureus (BCID) NOT DETECTED NOT DETECTED   Staphylococcus epidermidis DETECTED (A) NOT DETECTED   Staphylococcus lugdunensis NOT DETECTED NOT DETECTED   Streptococcus species NOT DETECTED NOT DETECTED   Streptococcus agalactiae NOT DETECTED NOT DETECTED   Streptococcus pneumoniae NOT DETECTED NOT DETECTED   Streptococcus pyogenes NOT DETECTED NOT DETECTED   A.calcoaceticus-baumannii NOT DETECTED NOT DETECTED   Bacteroides fragilis NOT DETECTED NOT DETECTED   Enterobacterales NOT DETECTED NOT DETECTED   Enterobacter cloacae complex NOT DETECTED NOT DETECTED   Escherichia coli NOT DETECTED NOT DETECTED   Klebsiella aerogenes NOT DETECTED NOT DETECTED   Klebsiella oxytoca NOT DETECTED NOT DETECTED   Klebsiella pneumoniae NOT DETECTED NOT DETECTED   Proteus species NOT DETECTED NOT DETECTED   Salmonella species NOT DETECTED NOT DETECTED   Serratia marcescens NOT DETECTED NOT DETECTED   Haemophilus  influenzae NOT DETECTED NOT DETECTED   Neisseria meningitidis NOT DETECTED NOT DETECTED   Pseudomonas aeruginosa NOT DETECTED NOT DETECTED   Stenotrophomonas maltophilia NOT DETECTED NOT DETECTED   Candida albicans NOT DETECTED NOT DETECTED   Candida auris NOT DETECTED NOT DETECTED   Candida glabrata NOT DETECTED NOT DETECTED   Candida krusei NOT DETECTED NOT DETECTED   Candida parapsilosis NOT DETECTED NOT DETECTED   Candida tropicalis NOT DETECTED NOT DETECTED   Cryptococcus neoformans/gattii NOT DETECTED NOT DETECTED   Methicillin resistance mecA/C DETECTED (A) NOT DETECTED    Calixto Pavel, Toribio Harbour, PharmD 12/17/2020  1:03 AM

## 2020-12-18 ENCOUNTER — Inpatient Hospital Stay (HOSPITAL_COMMUNITY): Payer: Medicare Other

## 2020-12-18 DIAGNOSIS — G40909 Epilepsy, unspecified, not intractable, without status epilepticus: Secondary | ICD-10-CM

## 2020-12-18 DIAGNOSIS — Z765 Malingerer [conscious simulation]: Secondary | ICD-10-CM

## 2020-12-18 DIAGNOSIS — K8050 Calculus of bile duct without cholangitis or cholecystitis without obstruction: Secondary | ICD-10-CM | POA: Diagnosis not present

## 2020-12-18 DIAGNOSIS — T80211A Bloodstream infection due to central venous catheter, initial encounter: Secondary | ICD-10-CM | POA: Diagnosis not present

## 2020-12-18 DIAGNOSIS — R509 Fever, unspecified: Secondary | ICD-10-CM | POA: Diagnosis not present

## 2020-12-18 DIAGNOSIS — U071 COVID-19: Secondary | ICD-10-CM | POA: Diagnosis not present

## 2020-12-18 DIAGNOSIS — T827XXA Infection and inflammatory reaction due to other cardiac and vascular devices, implants and grafts, initial encounter: Secondary | ICD-10-CM

## 2020-12-18 DIAGNOSIS — R7881 Bacteremia: Secondary | ICD-10-CM | POA: Diagnosis not present

## 2020-12-18 DIAGNOSIS — F419 Anxiety disorder, unspecified: Secondary | ICD-10-CM | POA: Diagnosis not present

## 2020-12-18 DIAGNOSIS — B957 Other staphylococcus as the cause of diseases classified elsewhere: Secondary | ICD-10-CM

## 2020-12-18 DIAGNOSIS — R569 Unspecified convulsions: Secondary | ICD-10-CM | POA: Diagnosis not present

## 2020-12-18 DIAGNOSIS — L8990 Pressure ulcer of unspecified site, unspecified stage: Secondary | ICD-10-CM | POA: Insufficient documentation

## 2020-12-18 DIAGNOSIS — Z9989 Dependence on other enabling machines and devices: Secondary | ICD-10-CM

## 2020-12-18 DIAGNOSIS — G894 Chronic pain syndrome: Secondary | ICD-10-CM | POA: Diagnosis not present

## 2020-12-18 DIAGNOSIS — K912 Postsurgical malabsorption, not elsewhere classified: Secondary | ICD-10-CM

## 2020-12-18 LAB — CBC WITH DIFFERENTIAL/PLATELET
Abs Immature Granulocytes: 0.01 10*3/uL (ref 0.00–0.07)
Basophils Absolute: 0 10*3/uL (ref 0.0–0.1)
Basophils Relative: 0 %
Eosinophils Absolute: 0.1 10*3/uL (ref 0.0–0.5)
Eosinophils Relative: 1 %
HCT: 24.2 % — ABNORMAL LOW (ref 36.0–46.0)
Hemoglobin: 8 g/dL — ABNORMAL LOW (ref 12.0–15.0)
Immature Granulocytes: 0 %
Lymphocytes Relative: 35 %
Lymphs Abs: 1.4 10*3/uL (ref 0.7–4.0)
MCH: 30.1 pg (ref 26.0–34.0)
MCHC: 33.1 g/dL (ref 30.0–36.0)
MCV: 91 fL (ref 80.0–100.0)
Monocytes Absolute: 0.5 10*3/uL (ref 0.1–1.0)
Monocytes Relative: 13 %
Neutro Abs: 2 10*3/uL (ref 1.7–7.7)
Neutrophils Relative %: 51 %
Platelets: 159 10*3/uL (ref 150–400)
RBC: 2.66 MIL/uL — ABNORMAL LOW (ref 3.87–5.11)
RDW: 14 % (ref 11.5–15.5)
WBC: 3.9 10*3/uL — ABNORMAL LOW (ref 4.0–10.5)
nRBC: 0 % (ref 0.0–0.2)

## 2020-12-18 LAB — COMPREHENSIVE METABOLIC PANEL
ALT: 15 U/L (ref 0–44)
AST: 13 U/L — ABNORMAL LOW (ref 15–41)
Albumin: 2.5 g/dL — ABNORMAL LOW (ref 3.5–5.0)
Alkaline Phosphatase: 128 U/L — ABNORMAL HIGH (ref 38–126)
Anion gap: 5 (ref 5–15)
BUN: 20 mg/dL (ref 6–20)
CO2: 27 mmol/L (ref 22–32)
Calcium: 8.8 mg/dL — ABNORMAL LOW (ref 8.9–10.3)
Chloride: 104 mmol/L (ref 98–111)
Creatinine, Ser: 1.07 mg/dL — ABNORMAL HIGH (ref 0.44–1.00)
GFR, Estimated: 60 mL/min — ABNORMAL LOW (ref >60)
Glucose, Bld: 125 mg/dL — ABNORMAL HIGH (ref 70–99)
Potassium: 3.3 mmol/L — ABNORMAL LOW (ref 3.5–5.1)
Sodium: 136 mmol/L (ref 135–145)
Total Bilirubin: 0.5 mg/dL (ref 0.3–1.2)
Total Protein: 5.8 g/dL — ABNORMAL LOW (ref 6.5–8.1)

## 2020-12-18 LAB — PROCALCITONIN: Procalcitonin: 4.58 ng/mL

## 2020-12-18 LAB — CK: Total CK: 25 U/L — ABNORMAL LOW (ref 38–234)

## 2020-12-18 LAB — GLUCOSE, CAPILLARY
Glucose-Capillary: 152 mg/dL — ABNORMAL HIGH (ref 70–99)
Glucose-Capillary: 122 mg/dL — ABNORMAL HIGH (ref 70–99)
Glucose-Capillary: 92 mg/dL (ref 70–99)
Glucose-Capillary: 111 mg/dL — ABNORMAL HIGH (ref 70–99)

## 2020-12-18 LAB — C-REACTIVE PROTEIN: CRP: 4.1 mg/dL — ABNORMAL HIGH (ref <1.0)

## 2020-12-18 LAB — LACTATE DEHYDROGENASE: LDH: 84 U/L — ABNORMAL LOW (ref 98–192)

## 2020-12-18 LAB — FERRITIN: Ferritin: 55 ng/mL (ref 11–307)

## 2020-12-18 LAB — MAGNESIUM: Magnesium: 1.8 mg/dL (ref 1.7–2.4)

## 2020-12-18 LAB — PHOSPHORUS: Phosphorus: 2.7 mg/dL (ref 2.5–4.6)

## 2020-12-18 LAB — D-DIMER, QUANTITATIVE: D-Dimer, Quant: 0.94 ug/mL-FEU — ABNORMAL HIGH (ref 0.00–0.50)

## 2020-12-18 MED ORDER — POTASSIUM CHLORIDE 10 MEQ/100ML IV SOLN
10.0000 meq | INTRAVENOUS | Status: AC
  Administered 2020-12-18 (×4): 10 meq via INTRAVENOUS
  Filled 2020-12-18 (×2): qty 100

## 2020-12-18 MED ORDER — LIDOCAINE 5 % EX PTCH
1.0000 | MEDICATED_PATCH | CUTANEOUS | Status: DC
  Administered 2020-12-18 – 2020-12-21 (×4): 1 via TRANSDERMAL
  Filled 2020-12-18 (×4): qty 1

## 2020-12-18 MED ORDER — TRACE MINERALS CU-MN-SE-ZN 300-55-60-3000 MCG/ML IV SOLN
INTRAVENOUS | Status: AC
  Filled 2020-12-18: qty 580

## 2020-12-18 NOTE — Consult Note (Signed)
Date of Admission:  12/15/2020          Reason for Consult: Coagulase-negative staphylococcal bacteremia   Referring Provider: Dr. Sherral Hammers   Assessment:  1. Coagulase-negative staphylococcal bacteremia  due to central line 2. COVID 19 breakthrough infection recent hospitalization status post remdesivir 3. Seizure 4. Short gut syndrom with TPN dependence 5. Narcotic seeking behavior 6. Concern ? [place]'s    Plan:  1. Continue IV daptomycin 2. Repeat blood cultures 3. Plan  to complete 2 weeks of therapy from date of first negative blood cultures 4. Investigate whether can do vancomycin lock to PICC when TPN not being infused 5. Airborne and contact precautions   Dr. [place] Bali to see tomorrow.  Principal Problem:   Seizure ([place]) Active Problems:   Chronic diarrhea   Short gut syndrome   Depression   Chronic pain syndrome   On total parenteral nutrition (TPN)   Anxiety   Bacteremia associated with intravascular line ([name])   Avascular necrosis ([name])   Choledocholithiasis (sludge) s/p ERCP 10/2019   Methadone dependence ([name])   CKD (chronic kidney disease) stage 3, GFR 30-59 ml/min ([name])   COVID-19 virus infection   Drug-seeking behavior   Bacteremia   [name]   Pressure injury of skin   Scheduled Meds: . amLODipine  10 mg Oral Daily  . budesonide  3 mg Oral Daily  . Chlorhexidine Gluconate Cloth  6 each Topical Daily  . cycloSPORINE  1 drop Both Eyes BID  . DULoxetine  90 mg Oral Daily  . enoxaparin (LOVENOX) injection  40 mg Subcutaneous Q24H  . estradiol  2 mg Oral Daily  . hydrALAZINE  75 mg Oral TID  . levETIRAcetam  500 mg Oral BID  . lidocaine  1 patch Transdermal Q24H  . methadone  5 mg Oral 5 X Daily  . metoprolol tartrate  100 mg Oral BID  . pantoprazole  40 mg Oral Daily  . polyvinyl alcohol  1 drop Both Eyes QID  . sucralfate  1 g Oral TID WC & HS  . vitamin B-12  1,000 mcg Oral Daily   Continuous Infusions: . DAPTOmycin  (CUBICIN)  IV 500 mg (12/17/20 2129)  . lactated ringers 10 mL/hr at 12/16/20 2220  . potassium chloride 10 mEq (12/18/20 1618)  . TPN CYCLIC-ADULT (ION)     PRN Meds:.acetaminophen **OR** acetaminophen, diphenoxylate-atropine, HYDROmorphone, ketorolac, lipase/protease/amylase, loperamide, ondansetron **OR** ondansetron (ZOFRAN) IV, promethazine, sodium chloride flush, tiZANidine  HPI: Caitlin Peters is a 60 y.o. 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  I last saw her on 14 November 2020.  Her multiple bacteremias and line associated infections are documented in my last clinic note as well as several of my partners in my notes.  She most recently had an Acinetobacter bacteremia and was treated with meropenem with retention of the central line.  When I saw her in the clinic she seemed to be doing relatively well.  She was quite somnolent prior to me seeing her but I saw her she was clearly awake.  Since that time she was admitted to the hospital with fever myalgia and cough and found to have COVID-19 infection.  She was given remdesivir over 3-day course and discharged to home.  She returns now after having had a witnessed seizure by her daughter.  He  has been seen by neurology of started Keppra.  She had a fevers and SIRS on admission and blood cultures have now yielded coagulase-negative Staphylococcus Epidermidis.  She is on daptomycin.  We will repeat blood cultures  We should certainly treat " through the catheter" this time given the low pathogenicity of this organism.  Note I have had concerns for possible Munchhausen's given her recurrent bacteremias though this is a suspicion and not proven.     Review of Systems: Review of Systems  Constitutional: Positive for chills, fever and  malaise/fatigue. Negative for diaphoresis and weight loss.  HENT: Negative for congestion, hearing loss, sore throat and tinnitus.   Eyes: Negative for blurred vision and double vision.  Respiratory: Negative for cough, sputum production, shortness of breath and wheezing.   Cardiovascular: Negative for chest pain, palpitations and leg swelling.  Gastrointestinal: Negative for abdominal pain, blood in stool, constipation, diarrhea, heartburn, melena, nausea and vomiting.  Genitourinary: Negative for dysuria, flank pain and hematuria.  Musculoskeletal: Positive for myalgias. Negative for back pain, falls and joint pain.  Skin: Negative for itching and rash.  Neurological: Positive for weakness. Negative for dizziness, sensory change, focal weakness, loss of consciousness and headaches.  Endo/Heme/Allergies: Does not bruise/bleed easily.  Psychiatric/Behavioral: Negative for depression, memory loss and suicidal ideas. The patient is not nervous/anxious.     Past Medical History:  Diagnosis Date  . Acute pancreatitis 04/13/2020  . Anasarca 10/2019  . AVN (avascular necrosis of bone) ([place])   . Cataract   . Choledocholithiasis (sludge) s/p ERCP 10/2019 10/21/2020  . Chronic pain syndrome   . CKD (chronic kidney disease), stage III ([place])   . Crohn disease ([place])   . Crohn's disease of small & large intestine with SGS 1984   Caitlin Peters is a 60year old female with a history of Crohn's disease diagnosed in 13 (age 48), history of long-term steroid use with osteoporosis, S/P multiple bowel resections ([id number]) complicated by chronic back and abdominal pain, steatorrhea and short bowel syndrome. The patient has been left with ~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 attached to proximal transverse colon through rectum. She has been  . Depression   . Diverticulosis   . GERD (gastroesophageal reflux disease)   . HTN (hypertension)   . IDA (iron deficiency anemia)   . Malnutrition ([place])   . Mass in chest   .  Osteoporosis   . Pancreatitis   . 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; 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; jejunal to transverse colon anastomosis Treated at [place] [facility] lengthening to 165cm Dr Alene Mires, [facility]  ESTABLISHED AT [facility]  . Vitamin B12 deficiency     Social History   Tobacco Use  . Smoking status: Former Research scientist (life sciences)  . Smokeless tobacco: Never Used  Vaping Use  . Vaping Use: Never used  Substance Use Topics  . Alcohol use: Not Currently  . Drug use: Never    Family History  Problem Relation Age of Onset  . Breast cancer Sister   . Multiple sclerosis Sister   . Diabetes Sister   . Lupus Sister   . Colon cancer Other   . Crohn's disease Other   . Seizures Mother   . Glaucoma Mother   . CAD Father   . Heart disease Father   . Hypertension Father    Allergies  Allergen Reactions  . Meperidine Hives    Other reaction(s):  GI Upset Due to Chrones   . Hyoscyamine Hives and Swelling    Legs swelling   Disorientation  . Cefepime Other (See Comments)    Neurotoxicity occurring in setting of AKI. Ceftriaxone tolerated during same admit  . Gabapentin Other (See Comments)    unknown  . Lyrica [Pregabalin] Other (See Comments)    unknown  . Topamax [Topiramate] Other (See Comments)    unknown  . Zosyn [Piperacillin Sod-Tazobactam So]     Patient reports it makes her vomit, her neck stiff, and her "heart feel funny"  . Fentanyl Rash    Pt is allergic to fentanyl patch related to the glue (gives her a rash) Pt states she is NOT allergic to fentanyl IV medicine  . Morphine And Related Rash    OBJECTIVE: Blood pressure (!) 155/96, pulse 63, temperature 98.4 F (36.9 C), temperature source Oral, resp. rate 17, height 5' 8"  (1.727 m), weight 61 kg, SpO2 100 %.  Physical Exam Constitutional:      General: She is not in acute distress.    Appearance: Normal appearance. She is  well-developed and well-nourished. She is not ill-appearing or diaphoretic.  HENT:     Head: Normocephalic and atraumatic.     Right Ear: Hearing and external ear normal.     Left Ear: Hearing and external ear normal.     Nose: No nasal deformity, rhinorrhea or epistaxis.  Eyes:     General: No scleral icterus.    Extraocular Movements: EOM normal.     Conjunctiva/sclera: Conjunctivae normal.     Right eye: Right conjunctiva is not injected.     Left eye: Left conjunctiva is not injected.  Neck:     Vascular: No JVD.  Cardiovascular:     Rate and Rhythm: Normal rate and regular rhythm.     Heart sounds: Normal heart sounds, S1 normal and S2 normal.  Abdominal:     General: There is no distension or ascites.     Palpations: Abdomen is soft. There is no hepatosplenomegaly.  Musculoskeletal:        General: Normal range of motion.     Right shoulder: Normal.     Left shoulder: Normal.     Cervical back: Normal range of motion and neck supple.     Right hip: Normal.     Left hip: Normal.     Right knee: Normal.     Left knee: Normal.  Lymphadenopathy:     Head:     Right side of head: No submandibular, preauricular or posterior auricular adenopathy.     Left side of head: No submandibular, preauricular or posterior auricular adenopathy.     Cervical: No cervical adenopathy.     Right cervical: No superficial or deep cervical adenopathy.    Left cervical: No superficial or deep cervical adenopathy.  Skin:    General: Skin is warm, dry and intact.     Findings: No abrasion, bruising, ecchymosis, lesion or rash.     Nails: There is no clubbing or cyanosis.  Neurological:     General: No focal deficit present.     Mental Status: She is alert and oriented to person, place, and time.     Sensory: No sensory deficit.     Coordination: Coordination normal.     Gait: Gait normal.     Deep Tendon Reflexes: Strength normal.  Psychiatric:        Attention and Perception: She is  attentive.  Mood and Affect: Mood and affect and mood normal.        Speech: Speech normal.        Behavior: Behavior normal. Behavior is cooperative.        Thought Content: Thought content normal.        Cognition and Memory: Cognition and memory normal.        Judgment: Judgment normal.    PICC line not overtly infected or purulent  Lab Results Lab Results  Component Value Date   WBC 3.9 (L) 12/18/2020   HGB 8.0 (L) 12/18/2020   HCT 24.2 (L) 12/18/2020   MCV 91.0 12/18/2020   PLT 159 12/18/2020    Lab Results  Component Value Date   CREATININE 1.07 (H) 12/18/2020   BUN 20 12/18/2020   NA 136 12/18/2020   K 3.3 (L) 12/18/2020   CL 104 12/18/2020   CO2 27 12/18/2020    Lab Results  Component Value Date   ALT 15 12/18/2020   AST 13 (L) 12/18/2020   ALKPHOS 128 (H) 12/18/2020   BILITOT 0.5 12/18/2020     Microbiology: Recent Results (from the past 240 hour(s))  Culture, blood (routine x 2)     Status: Abnormal (Preliminary result)   Collection Time: 12/15/20  1:06 AM   Specimen: BLOOD  Result Value Ref Range Status   Specimen Description   Final    BLOOD LEFT ARM Performed at [facility], [place] [address] [place], [place] [address]    Special Requests   Final    BOTTLES DRAWN AEROBIC AND ANAEROBIC Blood Culture adequate volume Performed at [place] [address] [place], [place] [address]    Culture  Setup Time   Final    IN BOTH AEROBIC AND ANAEROBIC BOTTLES GRAM POSITIVE COCCI CRITICAL VALUE NOTED.  VALUE IS CONSISTENT WITH PREVIOUSLY REPORTED AND CALLED VALUE. Performed at [facility], [name] [address] [place], [place] [address]    Culture STAPHYLOCOCCUS EPIDERMIDIS (A)  Final   Report Status PENDING  Incomplete  Urine culture     Status: None   Collection Time: 12/15/20  5:38 PM   Specimen: Urine, Clean Catch  Result Value Ref Range Status   Specimen Description   Final    URINE, CLEAN  CATCH Performed at [facility], [place] [address] [place], [place] [address]    Special Requests   Final    NONE Performed at [facility], [place] [address], [place], [place] [address]    Culture   Final    NO GROWTH Performed at [facility], [place] [address], [place], [place] [address]    Report Status 12/17/2020 FINAL  Final  Culture, blood (routine x 2)     Status: Abnormal (Preliminary result)   Collection Time: 12/15/20  7:16 PM   Specimen: BLOOD  Result Value Ref Range Status   Specimen Description   Final    BLOOD PICC LINE Performed at [place] [address] [place], [place] [address]    Special Requests   Final    BOTTLES DRAWN AEROBIC AND ANAEROBIC Blood Culture adequate volume Performed at [place] [address], [place], [place] [address]    Culture  Setup Time   Final    IN BOTH AEROBIC AND ANAEROBIC BOTTLES GRAM POSITIVE COCCI Organism ID to follow CRITICAL RESULT CALLED TO, READ BACK BY AND VERIFIED WITH: L POINDEXTER PHARMD 12/16/20  2330 JDW    Culture (A)  Final    STAPHYLOCOCCUS EPIDERMIDIS SUSCEPTIBILITIES TO FOLLOW Performed at [facility], [place] [address] [place], [place] [address]    Report Status PENDING  Incomplete  Blood Culture ID Panel (Reflexed)     Status: Abnormal   Collection Time: 12/15/20  7:16 PM  Result Value Ref Range Status   Enterococcus faecalis NOT DETECTED NOT DETECTED Final   Enterococcus Faecium NOT DETECTED NOT DETECTED Final   Listeria monocytogenes NOT DETECTED NOT DETECTED Final   Staphylococcus species DETECTED (A) NOT DETECTED Final    Comment: CRITICAL RESULT CALLED TO, READ BACK BY AND VERIFIED WITH: L POINDEXTER PHARMD 12/16/20 2330 JDW    Staphylococcus aureus (BCID) NOT DETECTED NOT DETECTED Final   Staphylococcus epidermidis DETECTED (A) NOT DETECTED Final    Comment: Methicillin (oxacillin) resistant coagulase negative  staphylococcus. Possible blood culture contaminant (unless isolated from more than one blood culture draw or clinical case suggests pathogenicity). No antibiotic treatment is indicated for blood  culture contaminants. CRITICAL RESULT CALLED TO, READ BACK BY AND VERIFIED WITH: L POINDEXTER PHARMD 12/16/20 2230 JDW    Staphylococcus lugdunensis NOT DETECTED NOT DETECTED Final   Streptococcus species NOT DETECTED NOT DETECTED Final   Streptococcus agalactiae NOT DETECTED NOT DETECTED Final   Streptococcus pneumoniae NOT DETECTED NOT DETECTED Final   Streptococcus pyogenes NOT DETECTED NOT DETECTED Final   A.calcoaceticus-baumannii NOT DETECTED NOT DETECTED Final   Bacteroides fragilis NOT DETECTED NOT DETECTED Final   Enterobacterales NOT DETECTED NOT DETECTED Final   Enterobacter cloacae complex NOT DETECTED NOT DETECTED Final   Escherichia coli NOT DETECTED NOT DETECTED Final   Klebsiella aerogenes NOT DETECTED NOT DETECTED Final   Klebsiella oxytoca NOT DETECTED NOT DETECTED Final   Klebsiella pneumoniae NOT DETECTED NOT DETECTED Final   Proteus species NOT DETECTED NOT DETECTED Final   Salmonella species NOT DETECTED NOT DETECTED Final   Serratia marcescens NOT DETECTED NOT DETECTED Final   Haemophilus influenzae NOT DETECTED NOT DETECTED Final   Neisseria meningitidis NOT DETECTED NOT DETECTED Final   Pseudomonas aeruginosa NOT DETECTED NOT DETECTED Final   Stenotrophomonas maltophilia NOT DETECTED NOT DETECTED Final   Candida albicans NOT DETECTED NOT DETECTED Final   Candida auris NOT DETECTED NOT DETECTED Final   Candida glabrata NOT DETECTED NOT DETECTED Final   Candida krusei NOT DETECTED NOT DETECTED Final   Candida parapsilosis NOT DETECTED NOT DETECTED Final   Candida tropicalis NOT DETECTED NOT DETECTED Final   Cryptococcus neoformans/gattii NOT DETECTED NOT DETECTED Final   Methicillin resistance mecA/C DETECTED (A) NOT DETECTED Final    Comment: CRITICAL RESULT CALLED TO,  READ BACK BY AND VERIFIED WITH: L POINDEXTER PHARMD 12/16/20 2330 JDW Performed at [facility], [address] [place], [place] [address]   Culture, blood (routine x 2)     Status: Abnormal (Preliminary result)   Collection Time: 12/16/20 12:15 PM   Specimen: BLOOD  Result Value Ref Range Status   Specimen Description   Final    BLOOD FROM CVC Performed at [place] [address], [place], [place] [address]    Special Requests   Final    BOTTLES DRAWN AEROBIC AND ANAEROBIC Blood Culture adequate volume Performed at [place] [address], [place], [place] [address]    Culture  Setup Time   Final    IN BOTH AEROBIC AND ANAEROBIC BOTTLES GRAM POSITIVE COCCI CRITICAL VALUE NOTED.  VALUE  IS CONSISTENT WITH PREVIOUSLY REPORTED AND CALLED VALUE. Performed at [facility], [place] [address] [place], [place] [address]    Culture STAPHYLOCOCCUS EPIDERMIDIS (A)  Final   Report Status PENDING  Incomplete  Culture, blood (routine x 2)     Status: None (Preliminary result)   Collection Time: 12/16/20  1:36 PM   Specimen: BLOOD  Result Value Ref Range Status   Specimen Description   Final    BLOOD RIGHT ANTECUBITAL Performed at [place] [address], [place], [place] [address]    Special Requests   Final    BOTTLES DRAWN AEROBIC ONLY Blood Culture results may not be optimal due to an inadequate volume of blood received in culture bottles Performed at [place] [address] [place], [place] [address]    Culture   Final    NO GROWTH 2 DAYS Performed at [place] [address] [place], [place] [address]    Report Status PENDING  Incomplete    Alcide Evener, [facility] [facility] [phone] pager  12/18/2020, 4:43 PM

## 2020-12-18 NOTE — Progress Notes (Signed)
PHARMACY - TOTAL PARENTERAL NUTRITION CONSULT NOTE   Indication: Short bowel syndrome  Patient Measurements: Height: 5' 8"  (172.7 cm) Weight: 61 kg (134 lb 8 oz) IBW/kg (Calculated) : 63.9 TPN AdjBW (KG): 61.2 Body mass index is 20.45 kg/m.  Assessment:  28 yoF, hx of Short gut syndrome on chronic cyclic TPN, can tolerate po. Recent admit and ED visits with Covid PNA. Now presenting with possible seizure. Plan is to admit for observation. Pharmacy to resume TPN.  Glucose / Insulin: CBGs < sl above 150, 3 units past 24 hr Electrolytes: K decr - 3.3, replete with K runs, Corr Ca 10 Renal: CKD. SCr ok at 1.07 LFTs / TGs: wnl exc Alk Phos - decr to 128 Prealbumin / albumin: Pre-albumin slightly low at 26.4 (1/21) Intake / Output; MIVF: UOP not measured, LR at 10 ml/hr GI Imaging: CT shows evidence of prior cholecystectomy and hysterectomy. Surgeries / Procedures:   Central access: CVC double lumen TPN start date: Chronic TPN  Nutritional Goals (per RD recommendation on **): kCal: ~1,500, Protein: ~85g, Fluid: ~1.8 L  Chronic PTA TPN is from [place].  Infuse 1800 mL over 12 hrs. Provides 85g of amino acids, 230g of dextrose, and SMOFlipid 40g only 3x/week  Current Nutrition:  Heart diet (unsure of absorption)  Plan:  KCl 10 mEq runs x 4 this am  At 1800: Infuse 1568m over 18 hrs: 44 mL/hr x 1 hr, then 88 mL/hr x 16 hrs, then 44 mL/hr x 1 hr. TTSS TPN will provide 87 g of protein, 240 g of dextrose, and 0 g of lipids for a total of 1164 kcals MWF TPN will provide 87 g of protein, 240 g of dextrose, and 50 g of lipids for a total of 1,660 kcals Electrolytes in TPN: 1086m/L of Na, incr to 5032mL of K, 0mE55m of Ca, 5mEq64mof Mg, and 15mmo80mof Phos. Cl:Ac ratio 1:2 Add standard MVI and trace elements to TPN Check CBGs 3x per day around cyclic TPN Decrease LR to 10ml/h14mnight to maintain ~1.8 L of fluid per day Monitor TPN labs on Mon/Thurs  Due to national lipid emulsion  shortage, will hold lipids from 1/21 and reasses on Monday (1/24) if not discharged F/U RD recs  Damarrion Mimbs, Minda Ditto 12/18/2020, 8:35 AM

## 2020-12-18 NOTE — Progress Notes (Signed)
PROGRESS NOTE    Caitlin Peters  NOB:[id number] DOB: 1961-07-09 DOA: 12/15/2020 PCP: [name], [name], MD     Brief Narrative:  Caitlin Peters is a 60 y.o. BF PMHx depression, acute pancreatitis, choledocholithiasis, Crohn's dz, short gut syndrome, CKD stage III, HTN, on chronic TPN, chronic pain syndrome, multiple prior line infections due to being on TPN since 2003.  Mass in chest, drug-seeking behavior  Pt presents to ED with apparent witnessed seizure per daughter.  Pt initially confused but now mentating normally.  Pt was just discharged from hospital for incidental COVID 19 infection 12/08/2020 (received 3-day course remdesivir).   Thankfully mild COVID symptoms (probably due to vaccinated+boosted status), pt discharged 3 days later.  Denies cough, SOB.  Endorses nausea and abd discomfort (chronic)  Per daughter last had seizure ~1 decade ago.    Subjective: 1/23 afebrile overnight.  Patient in a very good mood today was talking to me about her grandchildren and her dog.  Did not talk about wanting IV Dilaudid at all.  Did state that on her left lateral chest wall was still tender from her fall this summer where she struck a chair.    Assessment & Plan: Covid vaccination;   Principal Problem:   Seizure ([place]) Active Problems:   Chronic diarrhea   Short gut syndrome   Depression   Chronic pain syndrome   On total parenteral nutrition (TPN)   Anxiety   Bacteremia associated with intravascular line ([name])   Avascular necrosis ([name])   Choledocholithiasis (sludge) s/p ERCP 10/2019   Methadone dependence ([name])   CKD (chronic kidney disease) stage 3, GFR 30-59 ml/min ([name])   COVID-19 virus infection   Drug-seeking behavior   Bacteremia   [name]  SIRS -On admission meets criteria for SIRS temp> 38 C, RR> 20 -1/21 procalcitonin pending - 1/21 PCXR pending - Lactic acid= 0.5  Bacteremia positive Staph Epidermidis - 1/22 echocardiogram  pending - 1/22 per ID will DC Vancomycin and changed to Daptomycin most likely for at least 2 weeks of treatment. - 1/23 discussed case with Dr. Tommy Medal ID states continue Daptomycin for 2 weeks, starting date will be when her cultures are negative.    Seizure - Per neurology recommendation: -1/21 EEG pending -1/21 Wellbutrin 200 mg daily (hold) may lower seizure threshold -1/21 UDS positive opiates: Patient on opiates at home - Inpatient seizure precautions; IV Ativan PRN for seizure lasting longer than 5 minutes - Outpatient seizure precautions:Per [facility] statutes, patients with seizures are not allowed to drive until they have been seizure-free for six months.  Use caution when using heavy equipment or power tools. Avoid working on ladders or at heights. Take showers instead of baths. Ensure the water temperature is not too high on the home water heater. Do not go swimming alone. Do not lock yourself in a room alone (i.e. bathroom). When caring for infants or small children, sit down when holding, feeding, or changing them to minimize risk of injury to the child in the event you have a seizure. Maintain good sleep hygiene. Avoid alcohol.  -1/22 Dr. Leonel Ramsay Neurology states: The description of the current event is very consistent with seizure, and with a history of a previous seizure, there is consideration of antiepileptic therapy. - 1/22 per Neurology Keppra 500 mg BID   Acute diastolic CHF - Since/echocardiogram 09/03/2020 patient has developed diastolic CHF see results below - Strict in and out - Daily weight  COVID infection COVID-19 Labs  Recent Labs    12/16/20 1215 12/17/20 0500 12/18/20 0355  DDIMER 1.34* 0.90* 0.94*  FERRITIN 68 70 55  LDH 93* 93* 84*  CRP 7.3* 6.5* 4.1*    Lab Results  Component Value Date   SARSCOV2NAA POSITIVE (A) 12/05/2020   [place] NEGATIVE 11/07/2020   [place] NEGATIVE 10/21/2020   [place] NEGATIVE 09/01/2020   -Patient was treated with Remdesivir x3 days and discharged on 12/08/2020 - Patient will be contagious until 12/26/2020, while in the hospital should be placed in airborne protection.  Short gut syndrome - Continue TPN  Chronic pain syndrome -Continue methadone and Dilaudid p.o. - 1/22 states that she fell in June and injured her back and now is flaring up and whenever she comes in the hospital she is given IV Dilaudid along with her other p.o. Dilaudid and methadone. - 1/22 Zanaflex PRN must be judicious secondary to CKD stage III -1/22 Toradol IV 30 mg x 1 PRN  Drug-seeking behavior - Follow patient on methadone, Dilaudid p.o. and states that whenever she comes in the hospital they gave her IV Dilaudid in addition to her p.o. medication. -Patient becomes very agitated when informed will not mix IV Dilaudid with p.o. Dilaudid and p.o. methadone  CKD stage III (baseline Cr ave 1.28) Lab Results  Component Value Date   CREATININE 1.07 (H) 12/18/2020   CREATININE 1.28 (H) 12/17/2020   CREATININE 1.35 (H) 12/16/2020   CREATININE 1.39 (H) 12/16/2020   CREATININE 1.44 (H) 12/15/2020  -At baseline  Previous fall/possible fractured ribs. - DG CXR; R/O left fractured ribs.  Patient fell and struck her rib cage on a chair this summer.  Ensure ribs have healed, given continued pain  Hypokalemia - Potassium goal> 4 - Potassium IV 40 mEq  Hypomagnesmia - Magnesium goal> 2 - Magnesium in her TPN       DVT prophylaxis: Lovenox Code Status: Full Family Communication:  Status is: Inpatient    Dispo: The patient is from: Home              Anticipated d/c is to: Home              Anticipated d/c date is: 1/23              Patient currently unstable      Consultants:  Dr. Leonel Ramsay Neurology ID Dr. Tommy Medal  Procedures/Significant Events:  1/21 EEG: Suggestive of mild diffuse encephalopathy, nonspecific etiology. No seizures or epileptiform discharges were seen throughout  the recording. 1/22 echocardiogram:Left Ventricle: Left ventricular ejection fraction, by estimation, is 60  to 65%. The left ventricle has normal function. The left ventricle has no  regional wall motion abnormalities. The left ventricular internal cavity  size was normal in size. There is  mild concentric left ventricular hypertrophy. Left ventricular diastolic  parameters are consistent with Grade II diastolic dysfunction  (pseudonormalization). Normal left ventricular filling pressure.     I have personally reviewed and interpreted all radiology studies and my findings are as above.  VENTILATOR SETTINGS: Room air 1/23 SPO2 100%   Cultures 1/20 blood PICC line positive 2/2 staph epidermidis 1/20 blood LEFT arm positive 2/2 staph epidermidis     Antimicrobials: Anti-infectives (From admission, onward)   Start     Ordered Stop   12/18/20 0200  vancomycin (VANCOCIN) IVPB 1000 mg/200 mL premix  Status:  Discontinued        12/17/20 0103 12/17/20 1643   12/17/20 2000  DAPTOmycin (CUBICIN) 500 mg in sodium  chloride 0.9 % IVPB        12/17/20 1627     12/17/20 0130  vancomycin (VANCOREADY) IVPB 1250 mg/250 mL        12/17/20 0103 12/17/20 0409       Devices    LINES / TUBES:      Continuous Infusions: . DAPTOmycin (CUBICIN)  IV 500 mg (12/17/20 2129)  . lactated ringers 10 mL/hr at 12/16/20 2220  . potassium chloride 10 mEq (12/18/20 1307)  . TPN CYCLIC-ADULT (ION)       Objective: Vitals:   12/17/20 2044 12/18/20 0500 12/18/20 0600 12/18/20 1231  BP: (!) 142/68  (!) 159/79 (!) 155/96  Pulse: 65  69 63  Resp: 18  16 17   Temp: 98.4 F (36.9 C)  98.2 F (36.8 C) 98.4 F (36.9 C)  TempSrc: Oral  Oral Oral  SpO2: 98%  100% 100%  Weight:  61 kg    Height:        Intake/Output Summary (Last 24 hours) at 12/18/2020 1337 Last data filed at 12/18/2020 1030 Gross per 24 hour  Intake 1893.72 ml  Output -  Net 1893.72 ml   Filed Weights   12/15/20 1332  12/18/20 0500  Weight: 61.2 kg 61 kg    Examination:  General: A/O x4 No acute respiratory distress Eyes: negative scleral hemorrhage, negative anisocoria, negative icterus ENT: Negative Runny nose, negative gingival bleeding, Neck:  Negative scars, masses, torticollis, lymphadenopathy, JVD Lungs: Clear to auscultation bilaterally without wheezes or crackles Cardiovascular: Regular rate and rhythm without murmur gallop or rub normal S1 and S2, pain with palpation to lateral aspect of rib cage. Abdomen: negative abdominal pain, nondistended, positive soft, bowel sounds, no rebound, no ascites, no appreciable mass Extremities: No significant cyanosis, clubbing, or edema bilateral lower extremities, RIGHT femoral CVL negative sign of discharge, negative erythema, negative rubor. Skin: Negative rashes, lesions, ulcers Psychiatric:  Negative depression, negative anxiety, negative fatigue, negative mania  Central nervous system:  Cranial nerves II through XII intact, tongue/uvula midline, all extremities muscle strength 5/5, sensation intact throughout, negative dysarthria, negative expressive aphasia, negative receptive aphasia.  .     Data Reviewed: Care during the described time interval was provided by me .  I have reviewed this patient's available data, including medical history, events of note, physical examination, and all test results as part of my evaluation.  CBC: Recent Labs  Lab 12/15/20 1517 12/16/20 0320 12/16/20 1215 12/17/20 0500 12/18/20 0355  WBC 7.6 8.1 5.1 4.0 3.9*  NEUTROABS 6.7 7.1 4.0 2.3 2.0  HGB 9.3* 8.8* 8.0* 7.9* 8.0*  HCT 28.8* 26.8* 24.3* 24.2* 24.2*  MCV 94.1 92.1 91.7 91.3 91.0  PLT 191 171 147* 162 932   Basic Metabolic Panel: Recent Labs  Lab 12/15/20 0106 12/15/20 1517 12/16/20 0320 12/16/20 1215 12/17/20 0500 12/18/20 0355  NA  --  136 134* 134* 134* 136  K  --  4.4 4.2 3.6 3.7 3.3*  CL  --  109 107 104 102 104  CO2  --  18* 19* 21* 24  27  GLUCOSE  --  86 75 117* 140* 125*  BUN  --  46* 32* 28* 25* 20  CREATININE  --  1.44* 1.39* 1.35* 1.28* 1.07*  CALCIUM  --  9.1 9.2 8.8* 8.6* 8.8*  MG 2.2  --  2.0 1.9 2.0 1.8  PHOS  --   --  3.6 3.3 2.7 2.7   GFR: Estimated Creatinine Clearance: 54.5 mL/min (A) (by  C-G formula based on SCr of 1.07 mg/dL (H)). Liver Function Tests: Recent Labs  Lab 12/15/20 1517 12/16/20 0320 12/16/20 1215 12/17/20 0500 12/18/20 0355  AST 26 24 22 18  13*  ALT 24 22 21 18 15   ALKPHOS 184* 184* 158* 137* 128*  BILITOT 1.0 0.9 0.8 0.5 0.5  PROT 7.4 6.6 6.2* 5.9* 5.8*  ALBUMIN 3.5 3.1* 2.9* 2.6* 2.5*   No results for input(s): LIPASE, AMYLASE in the last 168 hours. No results for input(s): AMMONIA in the last 168 hours. Coagulation Profile: No results for input(s): INR, PROTIME in the last 168 hours. Cardiac Enzymes: Recent Labs  Lab 12/18/20 0355  CKTOTAL 25*   BNP (last 3 results) No results for input(s): PROBNP in the last 8760 hours. HbA1C: No results for input(s): HGBA1C in the last 72 hours. CBG: Recent Labs  Lab 12/17/20 0804 12/17/20 1214 12/17/20 1618 12/18/20 0742 12/18/20 1155  GLUCAP 161* 122* 103* 152* 122*   Lipid Profile: Recent Labs    12/16/20 0320  TRIG 72   Thyroid Function Tests: No results for input(s): TSH, T4TOTAL, FREET4, T3FREE, THYROIDAB in the last 72 hours. Anemia Panel: Recent Labs    12/17/20 0500 12/18/20 0355  FERRITIN 70 55   Sepsis Labs: Recent Labs  Lab 12/15/20 1517 12/16/20 1215 12/17/20 0500 12/18/20 0355  PROCALCITON  --  9.53 7.44 4.58  LATICACIDVEN 0.5  --   --   --     Recent Results (from the past 240 hour(s))  Culture, blood (routine x 2)     Status: Abnormal (Preliminary result)   Collection Time: 12/15/20  1:06 AM   Specimen: BLOOD  Result Value Ref Range Status   Specimen Description   Final    BLOOD LEFT ARM Performed at [facility] [address] [place], [place] [address]     Special Requests   Final    BOTTLES DRAWN AEROBIC AND ANAEROBIC Blood Culture adequate volume Performed at [facility] [address], [place], [place] [address]    Culture  Setup Time   Final    IN BOTH AEROBIC AND ANAEROBIC BOTTLES GRAM POSITIVE COCCI CRITICAL VALUE NOTED.  VALUE IS CONSISTENT WITH PREVIOUSLY REPORTED AND CALLED VALUE. Performed at [facility], [place] [address] [place], [place] [address]    Culture STAPHYLOCOCCUS EPIDERMIDIS (A)  Final   Report Status PENDING  Incomplete  Urine culture     Status: None   Collection Time: 12/15/20  5:38 PM   Specimen: Urine, Clean Catch  Result Value Ref Range Status   Specimen Description   Final    URINE, CLEAN CATCH Performed at [facility], [place] [address] [place], [place] [address]    Special Requests   Final    NONE Performed at [facility], [place] [address], [place], [place] [address]    Culture   Final    NO GROWTH Performed at [facility], [place] [address], [place], [place] [address]    Report Status 12/17/2020 FINAL  Final  Culture, blood (routine x 2)     Status: Abnormal (Preliminary result)   Collection Time: 12/15/20  7:16 PM   Specimen: BLOOD  Result Value Ref Range Status   Specimen Description   Final    BLOOD PICC LINE Performed at [facility] [address], [place] , [place] [address]    Special Requests   Final    BOTTLES DRAWN AEROBIC  AND ANAEROBIC Blood Culture adequate volume Performed at [facility] [address] [place], [place] [address]    Culture  Setup Time   Final    IN BOTH AEROBIC AND ANAEROBIC BOTTLES GRAM POSITIVE COCCI Organism ID to follow CRITICAL RESULT CALLED TO, READ BACK BY AND VERIFIED WITH: L POINDEXTER PHARMD 12/16/20 2330 JDW    Culture (A)  Final    STAPHYLOCOCCUS EPIDERMIDIS SUSCEPTIBILITIES TO FOLLOW Performed at [facility], [place] [address],  [place], [place] [address]    Report Status PENDING  Incomplete  Blood Culture ID Panel (Reflexed)     Status: Abnormal   Collection Time: 12/15/20  7:16 PM  Result Value Ref Range Status   Enterococcus faecalis NOT DETECTED NOT DETECTED Final   Enterococcus Faecium NOT DETECTED NOT DETECTED Final   Listeria monocytogenes NOT DETECTED NOT DETECTED Final   Staphylococcus species DETECTED (A) NOT DETECTED Final    Comment: CRITICAL RESULT CALLED TO, READ BACK BY AND VERIFIED WITH: L POINDEXTER PHARMD 12/16/20 2330 JDW    Staphylococcus aureus (BCID) NOT DETECTED NOT DETECTED Final   Staphylococcus epidermidis DETECTED (A) NOT DETECTED Final    Comment: Methicillin (oxacillin) resistant coagulase negative staphylococcus. Possible blood culture contaminant (unless isolated from more than one blood culture draw or clinical case suggests pathogenicity). No antibiotic treatment is indicated for blood  culture contaminants. CRITICAL RESULT CALLED TO, READ BACK BY AND VERIFIED WITH: L POINDEXTER PHARMD 12/16/20 2230 JDW    Staphylococcus lugdunensis NOT DETECTED NOT DETECTED Final   Streptococcus species NOT DETECTED NOT DETECTED Final   Streptococcus agalactiae NOT DETECTED NOT DETECTED Final   Streptococcus pneumoniae NOT DETECTED NOT DETECTED Final   Streptococcus pyogenes NOT DETECTED NOT DETECTED Final   A.calcoaceticus-baumannii NOT DETECTED NOT DETECTED Final   Bacteroides fragilis NOT DETECTED NOT DETECTED Final   Enterobacterales NOT DETECTED NOT DETECTED Final   Enterobacter cloacae complex NOT DETECTED NOT DETECTED Final   Escherichia coli NOT DETECTED NOT DETECTED Final   Klebsiella aerogenes NOT DETECTED NOT DETECTED Final   Klebsiella oxytoca NOT DETECTED NOT DETECTED Final   Klebsiella pneumoniae NOT DETECTED NOT DETECTED Final   Proteus species NOT DETECTED NOT DETECTED Final   Salmonella species NOT DETECTED NOT DETECTED Final   Serratia marcescens NOT DETECTED NOT DETECTED Final    Haemophilus influenzae NOT DETECTED NOT DETECTED Final   Neisseria meningitidis NOT DETECTED NOT DETECTED Final   Pseudomonas aeruginosa NOT DETECTED NOT DETECTED Final   Stenotrophomonas maltophilia NOT DETECTED NOT DETECTED Final   Candida albicans NOT DETECTED NOT DETECTED Final   Candida auris NOT DETECTED NOT DETECTED Final   Candida glabrata NOT DETECTED NOT DETECTED Final   Candida krusei NOT DETECTED NOT DETECTED Final   Candida parapsilosis NOT DETECTED NOT DETECTED Final   Candida tropicalis NOT DETECTED NOT DETECTED Final   Cryptococcus neoformans/gattii NOT DETECTED NOT DETECTED Final   Methicillin resistance mecA/C DETECTED (A) NOT DETECTED Final    Comment: CRITICAL RESULT CALLED TO, READ BACK BY AND VERIFIED WITH: L POINDEXTER PHARMD 12/16/20 2330 JDW Performed at [facility], [address] [place], [place] [address]   Culture, blood (routine x 2)     Status: Abnormal (Preliminary result)   Collection Time: 12/16/20 12:15 PM   Specimen: BLOOD  Result Value Ref Range Status   Specimen Description   Final    BLOOD FROM CVC Performed at [facility] [address], [place], [place] [address]  Special Requests   Final    BOTTLES DRAWN AEROBIC AND ANAEROBIC Blood Culture adequate volume Performed at [facility] [address] [place], [place] [address]    Culture  Setup Time   Final    IN BOTH AEROBIC AND ANAEROBIC BOTTLES GRAM POSITIVE COCCI CRITICAL VALUE NOTED.  VALUE IS CONSISTENT WITH PREVIOUSLY REPORTED AND CALLED VALUE. Performed at [facility], [place] [address] [place], [place] [address]    Culture STAPHYLOCOCCUS EPIDERMIDIS (A)  Final   Report Status PENDING  Incomplete  Culture, blood (routine x 2)     Status: None (Preliminary result)   Collection Time: 01-15-2021  1:36 PM   Specimen: BLOOD  Result Value Ref Range Status   Specimen Description   Final    BLOOD RIGHT ANTECUBITAL Performed at [facility] [address] [place], [place] [address]    Special Requests   Final    BOTTLES DRAWN AEROBIC ONLY Blood Culture results may not be optimal due to an inadequate volume of blood received in culture bottles Performed at [facility] [address], [place], [place] [address]    Culture   Final    NO GROWTH 2 DAYS Performed at [facility] [address], [place], [place] [address]    Report Status PENDING  Incomplete         Radiology Studies: EEG adult  Result Date: 2021/01/15 Lora Havens, MD     January 15, 2021  4:07 PM Patient Name: RHYLAN GROSS MRN: [id number] Epilepsy Attending: Lora Havens Referring Physician/Provider: Dr. Dia Crawford Date: 15-Jan-2021 Duration: 26.31 mins Patient history: 60 year old female who presented with seizures.  EEG to evaluate for seizures. Level of alertness: Awake, drowsy AEDs during EEG study: None Technical aspects: This EEG study was done with scalp electrodes positioned according to the 10-20 International system of electrode placement. Electrical activity was acquired at a sampling rate of 500Hz  and reviewed with a high frequency filter of 70Hz  and a low frequency filter of 1Hz . EEG data were recorded continuously and digitally stored. Description: The posterior dominant rhythm consists of 8.5 Hz activity of moderate voltage (25-35 uV) seen predominantly in posterior head regions, symmetric and reactive to eye opening and eye closing. Drowsiness was characterized by attenuation of the posterior background rhythm. EEG showed intermittent generalized 2-3 hz delta slowing. Physioloic photic driving was not seen during photic stimulation.  Hyperventilation was not performed.   ABNORMALITY -Intermittent slow, generalized IMPRESSION: This study is suggestive of mild diffuse encephalopathy, nonspecific etiology. No seizures or epileptiform discharges were seen throughout the recording. Lora Havens    ECHOCARDIOGRAM COMPLETE  Result Date: 12/17/2020    ECHOCARDIOGRAM REPORT   Patient Name:   IMONIE TUCH Date of Exam: 12/17/2020 Medical Rec #:  [id number]        Height:       68.0 in Accession #:    [id number]       Weight:       134.9 lb Date of Birth:  [birth year]        BSA:          1.729 m Patient Age:    59 years         BP:           162/85 mmHg Patient Gender: F                HR:  64 bpm. Exam Location:  Inpatient Procedure: 2D Echo, Cardiac Doppler and Color Doppler Indications:    Bacteremia  History:        Patient has prior history of Echocardiogram examinations, most                 recent 09/03/2020. Arrythmias:PAC; Risk Factors:Former Smoker and                 Hypertension. COVID-19 virus infection, Methadone dependence per                 chart.  Sonographer:    Alvino Chapel RCS Referring Phys: [id number] [place]  1. Left ventricular ejection fraction, by estimation, is 60 to 65%. The left ventricle has normal function. The left ventricle has no regional wall motion abnormalities. There is mild concentric left ventricular hypertrophy and severe basal septal hypertrophy.  2. Right ventricular systolic function is normal. The right ventricular size is normal. There is normal pulmonary artery systolic pressure. The estimated right ventricular systolic pressure is 51.8 mmHg.  3. The mitral valve is normal in structure. Mild mitral valve regurgitation. No evidence of mitral stenosis.  4. The aortic valve is tricuspid. Aortic valve regurgitation is not visualized. Mild aortic valve sclerosis is present, with no evidence of aortic valve stenosis.  5. The inferior vena cava is normal in size with greater than 50% respiratory variability, suggesting right atrial pressure of 3 mmHg.  6. Left atrial size was moderately dilated. FINDINGS  Left Ventricle: Left ventricular ejection fraction, by estimation, is 60 to 65%. The left ventricle has normal function. The left ventricle  has no regional wall motion abnormalities. The left ventricular internal cavity size was normal in size. There is  mild concentric left ventricular hypertrophy. Left ventricular diastolic parameters are consistent with Grade II diastolic dysfunction (pseudonormalization). Normal left ventricular filling pressure. Right Ventricle: The right ventricular size is normal. No increase in right ventricular wall thickness. Right ventricular systolic function is normal. There is normal pulmonary artery systolic pressure. The tricuspid regurgitant velocity is 2.41 m/s, and  with an assumed right atrial pressure of 3 mmHg, the estimated right ventricular systolic pressure is 84.1 mmHg. Left Atrium: Left atrial size was moderately dilated. Right Atrium: Right atrial size was normal in size. Pericardium: There is no evidence of pericardial effusion. Mitral Valve: The mitral valve is normal in structure. Mild mitral valve regurgitation. No evidence of mitral valve stenosis. Tricuspid Valve: The tricuspid valve is normal in structure. Tricuspid valve regurgitation is mild . No evidence of tricuspid stenosis. Aortic Valve: The aortic valve is tricuspid. Aortic valve regurgitation is not visualized. Mild aortic valve sclerosis is present, with no evidence of aortic valve stenosis. Pulmonic Valve: The pulmonic valve was normal in structure. Pulmonic valve regurgitation is not visualized. No evidence of pulmonic stenosis. Aorta: The aortic root is normal in size and structure. Venous: The inferior vena cava is normal in size with greater than 50% respiratory variability, suggesting right atrial pressure of 3 mmHg. IAS/Shunts: No atrial level shunt detected by color flow Doppler.  LEFT VENTRICLE PLAX 2D LVIDd:         4.80 cm  Diastology LVIDs:         2.30 cm  LV e' medial:    6.85 cm/s LV PW:         1.20 cm  LV E/e' medial:  12.0 LV IVS:        1.50 cm  LV e'  lateral:   8.59 cm/s LVOT diam:     2.20 cm  LV E/e' lateral: 9.5 LV SV:          125 LV SV Index:   73 LVOT Area:     3.80 cm  RIGHT VENTRICLE RV S prime:     10.60 cm/s TAPSE (M-mode): 1.8 cm LEFT ATRIUM             Index       RIGHT ATRIUM           Index LA diam:        4.90 cm 2.83 cm/m  RA Area:     13.70 cm LA Vol (A2C):   71.5 ml 41.36 ml/m RA Volume:   32.80 ml  18.97 ml/m LA Vol (A4C):   82.7 ml 47.83 ml/m LA Biplane Vol: 78.7 ml 45.52 ml/m  AORTIC VALVE LVOT Vmax:   159.00 cm/s LVOT Vmean:  97.900 cm/s LVOT VTI:    0.330 m  AORTA Ao Root diam: 3.20 cm MITRAL VALVE               TRICUSPID VALVE MV Area (PHT): 2.58 cm    TR Peak grad:   23.2 mmHg MV Decel Time: 294 msec    TR Vmax:        241.00 cm/s MV E velocity: 81.90 cm/s MV A velocity: 86.00 cm/s  SHUNTS MV E/A ratio:  0.95        Systemic VTI:  0.33 m                            Systemic Diam: 2.20 cm Fransico Him MD Electronically signed by Fransico Him MD Signature Date/Time: 12/17/2020/4:09:18 PM    Final         Scheduled Meds: . amLODipine  10 mg Oral Daily  . budesonide  3 mg Oral Daily  . Chlorhexidine Gluconate Cloth  6 each Topical Daily  . cycloSPORINE  1 drop Both Eyes BID  . DULoxetine  90 mg Oral Daily  . enoxaparin (LOVENOX) injection  40 mg Subcutaneous Q24H  . estradiol  2 mg Oral Daily  . hydrALAZINE  75 mg Oral TID  . levETIRAcetam  500 mg Oral BID  . lidocaine  1 patch Transdermal Q24H  . methadone  5 mg Oral 5 X Daily  . metoprolol tartrate  100 mg Oral BID  . pantoprazole  40 mg Oral Daily  . polyvinyl alcohol  1 drop Both Eyes QID  . sucralfate  1 g Oral TID WC & HS  . vitamin B-12  1,000 mcg Oral Daily   Continuous Infusions: . DAPTOmycin (CUBICIN)  IV 500 mg (12/17/20 2129)  . lactated ringers 10 mL/hr at 12/16/20 2220  . potassium chloride 10 mEq (12/18/20 1307)  . TPN CYCLIC-ADULT (ION)       LOS: 2 days    Time spent:40 min    WOODS, Geraldo Docker, MD [facility] Pager [phone]  If 7PM-7AM, please contact night-coverage [email or web address] Password  [facility] 12/18/2020, 1:37 PM

## 2020-12-19 DIAGNOSIS — G40909 Epilepsy, unspecified, not intractable, without status epilepticus: Secondary | ICD-10-CM | POA: Diagnosis not present

## 2020-12-19 DIAGNOSIS — R509 Fever, unspecified: Secondary | ICD-10-CM | POA: Diagnosis not present

## 2020-12-19 DIAGNOSIS — B957 Other staphylococcus as the cause of diseases classified elsewhere: Secondary | ICD-10-CM | POA: Diagnosis not present

## 2020-12-19 DIAGNOSIS — R569 Unspecified convulsions: Secondary | ICD-10-CM | POA: Diagnosis not present

## 2020-12-19 DIAGNOSIS — F419 Anxiety disorder, unspecified: Secondary | ICD-10-CM | POA: Diagnosis not present

## 2020-12-19 DIAGNOSIS — G894 Chronic pain syndrome: Secondary | ICD-10-CM | POA: Diagnosis not present

## 2020-12-19 DIAGNOSIS — K5090 Crohn's disease, unspecified, without complications: Secondary | ICD-10-CM | POA: Diagnosis not present

## 2020-12-19 DIAGNOSIS — T80211A Bloodstream infection due to central venous catheter, initial encounter: Secondary | ICD-10-CM | POA: Diagnosis not present

## 2020-12-19 DIAGNOSIS — R7881 Bacteremia: Secondary | ICD-10-CM | POA: Diagnosis not present

## 2020-12-19 DIAGNOSIS — K8050 Calculus of bile duct without cholangitis or cholecystitis without obstruction: Secondary | ICD-10-CM | POA: Diagnosis not present

## 2020-12-19 LAB — CULTURE, BLOOD (ROUTINE X 2)
Special Requests: ADEQUATE
Special Requests: ADEQUATE
Special Requests: ADEQUATE

## 2020-12-19 LAB — FERRITIN: Ferritin: 49 ng/mL (ref 11–307)

## 2020-12-19 LAB — COMPREHENSIVE METABOLIC PANEL
ALT: 13 U/L (ref 0–44)
AST: 11 U/L — ABNORMAL LOW (ref 15–41)
Albumin: 2.6 g/dL — ABNORMAL LOW (ref 3.5–5.0)
Alkaline Phosphatase: 117 U/L (ref 38–126)
Anion gap: 9 (ref 5–15)
BUN: 24 mg/dL — ABNORMAL HIGH (ref 6–20)
CO2: 25 mmol/L (ref 22–32)
Calcium: 9 mg/dL (ref 8.9–10.3)
Chloride: 104 mmol/L (ref 98–111)
Creatinine, Ser: 1.12 mg/dL — ABNORMAL HIGH (ref 0.44–1.00)
GFR, Estimated: 57 mL/min — ABNORMAL LOW (ref >60)
Glucose, Bld: 114 mg/dL — ABNORMAL HIGH (ref 70–99)
Potassium: 3.7 mmol/L (ref 3.5–5.1)
Sodium: 138 mmol/L (ref 135–145)
Total Bilirubin: 0.4 mg/dL (ref 0.3–1.2)
Total Protein: 6 g/dL — ABNORMAL LOW (ref 6.5–8.1)

## 2020-12-19 LAB — CBC WITH DIFFERENTIAL/PLATELET
Abs Immature Granulocytes: 0.01 10*3/uL (ref 0.00–0.07)
Basophils Absolute: 0 10*3/uL (ref 0.0–0.1)
Basophils Relative: 0 %
Eosinophils Absolute: 0.1 10*3/uL (ref 0.0–0.5)
Eosinophils Relative: 2 %
HCT: 24.7 % — ABNORMAL LOW (ref 36.0–46.0)
Hemoglobin: 8.1 g/dL — ABNORMAL LOW (ref 12.0–15.0)
Immature Granulocytes: 0 %
Lymphocytes Relative: 41 %
Lymphs Abs: 1.5 10*3/uL (ref 0.7–4.0)
MCH: 30.3 pg (ref 26.0–34.0)
MCHC: 32.8 g/dL (ref 30.0–36.0)
MCV: 92.5 fL (ref 80.0–100.0)
Monocytes Absolute: 0.4 10*3/uL (ref 0.1–1.0)
Monocytes Relative: 10 %
Neutro Abs: 1.7 10*3/uL (ref 1.7–7.7)
Neutrophils Relative %: 47 %
Platelets: 172 10*3/uL (ref 150–400)
RBC: 2.67 MIL/uL — ABNORMAL LOW (ref 3.87–5.11)
RDW: 13.8 % (ref 11.5–15.5)
WBC: 3.7 10*3/uL — ABNORMAL LOW (ref 4.0–10.5)
nRBC: 0 % (ref 0.0–0.2)

## 2020-12-19 LAB — MAGNESIUM: Magnesium: 1.9 mg/dL (ref 1.7–2.4)

## 2020-12-19 LAB — PREALBUMIN: Prealbumin: 18.3 mg/dL (ref 18–38)

## 2020-12-19 LAB — D-DIMER, QUANTITATIVE: D-Dimer, Quant: 0.91 ug/mL-FEU — ABNORMAL HIGH (ref 0.00–0.50)

## 2020-12-19 LAB — GLUCOSE, CAPILLARY
Glucose-Capillary: 113 mg/dL — ABNORMAL HIGH (ref 70–99)
Glucose-Capillary: 116 mg/dL — ABNORMAL HIGH (ref 70–99)
Glucose-Capillary: 91 mg/dL (ref 70–99)

## 2020-12-19 LAB — C-REACTIVE PROTEIN: CRP: 3.2 mg/dL — ABNORMAL HIGH (ref <1.0)

## 2020-12-19 LAB — TRIGLYCERIDES: Triglycerides: 51 mg/dL (ref <150)

## 2020-12-19 LAB — PHOSPHORUS: Phosphorus: 3.1 mg/dL (ref 2.5–4.6)

## 2020-12-19 LAB — LACTATE DEHYDROGENASE: LDH: 82 U/L — ABNORMAL LOW (ref 98–192)

## 2020-12-19 MED ORDER — POTASSIUM CHLORIDE 10 MEQ/100ML IV SOLN
10.0000 meq | INTRAVENOUS | Status: AC
  Administered 2020-12-19 (×2): 10 meq via INTRAVENOUS
  Filled 2020-12-19 (×2): qty 100

## 2020-12-19 MED ORDER — MAGNESIUM SULFATE 2 GM/50ML IV SOLN
2.0000 g | Freq: Once | INTRAVENOUS | Status: AC
  Administered 2020-12-19: 2 g via INTRAVENOUS
  Filled 2020-12-19: qty 50

## 2020-12-19 MED ORDER — VANCOMYCIN 2.5 MG/ML + HEPARIN 2500 UNITS/ML ABX LOCK SOLN
2.5000 mL | Status: DC
  Administered 2020-12-19 – 2020-12-21 (×3): 2.5 mL
  Filled 2020-12-19 (×3): qty 2.5

## 2020-12-19 MED ORDER — TRACE MINERALS CU-MN-SE-ZN 300-55-60-3000 MCG/ML IV SOLN
INTRAVENOUS | Status: AC
  Filled 2020-12-19: qty 580

## 2020-12-19 NOTE — Plan of Care (Signed)
  Problem: Pain Managment: Goal: General experience of comfort will improve Outcome: Progressing   Problem: Health Behavior/Discharge Planning: Goal: Ability to manage health-related needs will improve Outcome: Adequate for Discharge   Problem: Coping: Goal: Level of anxiety will decrease Outcome: Adequate for Discharge   Problem: Elimination: Goal: Will not experience complications related to bowel motility Outcome: Adequate for Discharge   Problem: Safety: Goal: Ability to remain free from injury will improve Outcome: Adequate for Discharge

## 2020-12-19 NOTE — TOC Progression Note (Signed)
Transition of Care [facility]) - Progression Note    Patient Details  Name: Caitlin Peters MRN: [id number] Date of Birth: December 29, 1960  Transition of Care [facility]) CM/SW Contact  Purcell Mouton, RN Phone Number: 12/19/2020, 2:49 PM  Clinical Narrative:    Pt from home with family. TOC will continue to follow for discharge needs.    Expected Discharge Plan: Home/Self Care Barriers to Discharge: No Barriers Identified  Expected Discharge Plan and Services Expected Discharge Plan: Home/Self Care       Living arrangements for the past 2 months: Single Family Home                                       Social Determinants of Health (SDOH) Interventions    Readmission Risk Interventions Readmission Risk Prevention Plan 11/09/2020 09/09/2020 08/05/2020  Transportation Screening Complete Complete Complete  PCP or Specialist Appt within 3-5 Days - - -  Not Complete comments - - -  HRI or [place] Work Consult for [place] Planning/Counseling - - -  SW consult not completed comments - - -  Palliative Care Screening - - -  Medication Review Press photographer) Complete Complete Complete  PCP or Specialist appointment within 3-5 days of discharge Complete Complete Complete  HRI or Home Care Consult Complete Complete Complete  SW Recovery Care/Counseling Consult - Complete Complete  Palliative Care Screening Not Applicable Not Applicable Not [place] Not Applicable Not Applicable Not Applicable

## 2020-12-19 NOTE — Progress Notes (Addendum)
[facility] Infectious Diseases Follow Up Note  Patient Identification: Patient Name: Caitlin Peters MRN: [id number] Real Date: 12/15/2020  1:26 PM Age: 60 y.o.Today's Date: 12/19/2020   Reason for Visit: Bacteremia   Principal Problem:   Seizure [facility]) Active Problems:   Chronic diarrhea   Short gut syndrome   Depression   Chronic pain syndrome   On total parenteral nutrition (TPN)   Anxiety   Bacteremia associated with intravascular line ([name])   Avascular necrosis ([name])   Choledocholithiasis (sludge) s/p ERCP 10/2019   Methadone dependence ([name])   CKD (chronic kidney disease) stage 3, GFR 30-59 ml/min ([name])   COVID-19 virus infection   Drug-seeking behavior   Bacteremia   [name]   Pressure injury of skin   Antibiotics: Daptomycin Day 3                               Total days of antibiotics Day 4  Lines/Tubes: Rt femoral catheter placed on 08/05/20.   Interval Events: afebrile, no leukocytosis, hemodynamically stable. Repeat blood cx 1/23 pending    Assessment # MRSE bacteremia in the setting of presence of Rt Femoral Catheter in a CKD patient with limited vascular access - TTE 1/22 no vegetations  # H/o multiple line infections with concerns of [place]/s by other providers closely following her # ? Seizure # COVID 19 infection with recent hospitalization and s/p remdesevir  # Crohns disease with short gut syndrome s/p TPN # Narcotic Seeking Behaviour   Recommendations -Continue Daptomycin as is. CPK 25 -Patient says she has done antibiotic lock previously. She is on TPN 12 hrs a day and gets fluid boluses for another 1-2 hrs. So, there is a possibility to do antibiotic lock as there is 6-8 hrs time frame when central line is not being used. Pharmacy working on it.  -Follow up repeat blood cx from 1/23 -Will plan to treat for 2 weeks from date of negative blood cx -Monitor CBC, BMP and CPK while on  IV abx -Following   Rest of the management as per the primary team. Thank you for the consult. Please page with pertinent questions or concerns.  ______________________________________________________________________ Subjective patient seen and examined at the bedside. She denies any fevers, chills, nausea/vomiting and diarrhea. Tolerating abx well without any issues.   Vitals BP (!) 157/82   Pulse 64   Temp 98.8 F (37.1 C) (Oral)   Resp 16   Ht 5' 8"  (1.727 m)   Wt 60.4 kg   SpO2 99%   BMI 20.25 kg/m     Physical Exam Constitutional:  Not in acute distress and appears comfortable    Comments:   Cardiovascular:     Rate and Rhythm: Normal rate and regular rhythm.     Heart sounds: Soft systolic murmur+  Pulmonary:     Effort: Pulmonary effort is normal.     Comments:   Abdominal:     Palpations: Abdomen is soft.     Tenderness: Non tender   Musculoskeletal:        General: No swelling or tenderness.   Skin:    Comments: No obvious lesions or rashes   Neurological:     General: No focal deficit present.   Psychiatric:        Mood and Affect: Mood normal.     Pertinent Microbiology Results for orders placed or performed during the hospital encounter of 12/15/20  Culture, blood (  routine x 2)     Status: Abnormal   Collection Time: 12/15/20  1:06 AM   Specimen: BLOOD  Result Value Ref Range Status   Specimen Description   Final    BLOOD LEFT ARM Performed at [facility] [address], [place], [place] [address]    Special Requests   Final    BOTTLES DRAWN AEROBIC AND ANAEROBIC Blood Culture adequate volume Performed at [facility] [address], [place], [place] [address]    Culture  Setup Time   Final    IN BOTH AEROBIC AND ANAEROBIC BOTTLES GRAM POSITIVE COCCI CRITICAL VALUE NOTED.  VALUE IS CONSISTENT WITH PREVIOUSLY REPORTED AND CALLED VALUE.    Culture (A)  Final    STAPHYLOCOCCUS  EPIDERMIDIS SUSCEPTIBILITIES PERFORMED ON PREVIOUS CULTURE WITHIN THE LAST 5 DAYS. Performed at [facility], [place] [address], [place], [place] [address]    Report Status 12/19/2020 FINAL  Final  Urine culture     Status: None   Collection Time: 12/15/20  5:38 PM   Specimen: Urine, Clean Catch  Result Value Ref Range Status   Specimen Description   Final    URINE, CLEAN CATCH Performed at [facility], [place] [address], [place], [place] [address]    Special Requests   Final    NONE Performed at [facility], [place] [address], [place], [place] [address]    Culture   Final    NO GROWTH Performed at [facility], [place] [address] [place], [place] [address]    Report Status 12/17/2020 FINAL  Final  Culture, blood (routine x 2)     Status: Abnormal   Collection Time: 12/15/20  7:16 PM   Specimen: BLOOD  Result Value Ref Range Status   Specimen Description   Final    BLOOD PICC LINE Performed at [facility] [address], [place], [place] [address]    Special Requests   Final    BOTTLES DRAWN AEROBIC AND ANAEROBIC Blood Culture adequate volume Performed at [facility] [address] [place], [place] [address]    Culture  Setup Time   Final    IN BOTH AEROBIC AND ANAEROBIC BOTTLES GRAM POSITIVE COCCI CRITICAL RESULT CALLED TO, READ BACK BY AND VERIFIED WITH: [facility] 12/16/20 2330 JDW Performed at [facility], [place] [address] [place], [place] [address]    Culture STAPHYLOCOCCUS EPIDERMIDIS (A)  Final   Report Status 12/19/2020 FINAL  Final   Organism ID, Bacteria STAPHYLOCOCCUS EPIDERMIDIS  Final      Susceptibility   Staphylococcus epidermidis - MIC*    CIPROFLOXACIN >=8 RESISTANT Resistant     ERYTHROMYCIN >=8 RESISTANT Resistant     GENTAMICIN >=16 RESISTANT Resistant     OXACILLIN >=4 RESISTANT Resistant     TETRACYCLINE 2 SENSITIVE Sensitive     VANCOMYCIN 1  SENSITIVE Sensitive     TRIMETH/SULFA 80 RESISTANT Resistant     CLINDAMYCIN >=8 RESISTANT Resistant     RIFAMPIN <=0.5 SENSITIVE Sensitive     Inducible Clindamycin NEGATIVE Sensitive     * STAPHYLOCOCCUS EPIDERMIDIS  Blood Culture ID Panel (Reflexed)     Status: Abnormal   Collection Time: 12/15/20  7:16 PM  Result Value Ref Range Status   Enterococcus faecalis NOT DETECTED NOT DETECTED Final   Enterococcus Faecium NOT DETECTED NOT DETECTED Final   Listeria monocytogenes NOT DETECTED NOT DETECTED Final  Staphylococcus species DETECTED (A) NOT DETECTED Final    Comment: CRITICAL RESULT CALLED TO, READ BACK BY AND VERIFIED WITH: L POINDEXTER PHARMD 12/16/20 2330 JDW    Staphylococcus aureus (BCID) NOT DETECTED NOT DETECTED Final   Staphylococcus epidermidis DETECTED (A) NOT DETECTED Final    Comment: Methicillin (oxacillin) resistant coagulase negative staphylococcus. Possible blood culture contaminant (unless isolated from more than one blood culture draw or clinical case suggests pathogenicity). No antibiotic treatment is indicated for blood  culture contaminants. CRITICAL RESULT CALLED TO, READ BACK BY AND VERIFIED WITH: L POINDEXTER PHARMD 12/16/20 2230 JDW    Staphylococcus lugdunensis NOT DETECTED NOT DETECTED Final   Streptococcus species NOT DETECTED NOT DETECTED Final   Streptococcus agalactiae NOT DETECTED NOT DETECTED Final   Streptococcus pneumoniae NOT DETECTED NOT DETECTED Final   Streptococcus pyogenes NOT DETECTED NOT DETECTED Final   A.calcoaceticus-baumannii NOT DETECTED NOT DETECTED Final   Bacteroides fragilis NOT DETECTED NOT DETECTED Final   Enterobacterales NOT DETECTED NOT DETECTED Final   Enterobacter cloacae complex NOT DETECTED NOT DETECTED Final   Escherichia coli NOT DETECTED NOT DETECTED Final   Klebsiella aerogenes NOT DETECTED NOT DETECTED Final   Klebsiella oxytoca NOT DETECTED NOT DETECTED Final   Klebsiella pneumoniae NOT DETECTED NOT DETECTED  Final   Proteus species NOT DETECTED NOT DETECTED Final   Salmonella species NOT DETECTED NOT DETECTED Final   Serratia marcescens NOT DETECTED NOT DETECTED Final   Haemophilus influenzae NOT DETECTED NOT DETECTED Final   Neisseria meningitidis NOT DETECTED NOT DETECTED Final   Pseudomonas aeruginosa NOT DETECTED NOT DETECTED Final   Stenotrophomonas maltophilia NOT DETECTED NOT DETECTED Final   Candida albicans NOT DETECTED NOT DETECTED Final   Candida auris NOT DETECTED NOT DETECTED Final   Candida glabrata NOT DETECTED NOT DETECTED Final   Candida krusei NOT DETECTED NOT DETECTED Final   Candida parapsilosis NOT DETECTED NOT DETECTED Final   Candida tropicalis NOT DETECTED NOT DETECTED Final   Cryptococcus neoformans/gattii NOT DETECTED NOT DETECTED Final   Methicillin resistance mecA/C DETECTED (A) NOT DETECTED Final    Comment: CRITICAL RESULT CALLED TO, READ BACK BY AND VERIFIED WITH: L POINDEXTER PHARMD 12/16/20 2330 JDW Performed at [facility], [address], [place], [place] [address]   Culture, blood (routine x 2)     Status: Abnormal   Collection Time: 12/16/20 12:15 PM   Specimen: BLOOD  Result Value Ref Range Status   Specimen Description   Final    BLOOD FROM CVC Performed at [facility] [address], [place], [place] [address]    Special Requests   Final    BOTTLES DRAWN AEROBIC AND ANAEROBIC Blood Culture adequate volume Performed at [facility] [address], [place], [place] [address]    Culture  Setup Time   Final    IN BOTH AEROBIC AND ANAEROBIC BOTTLES GRAM POSITIVE COCCI CRITICAL VALUE NOTED.  VALUE IS CONSISTENT WITH PREVIOUSLY REPORTED AND CALLED VALUE.    Culture (A)  Final    STAPHYLOCOCCUS EPIDERMIDIS SUSCEPTIBILITIES PERFORMED ON PREVIOUS CULTURE WITHIN THE LAST 5 DAYS. Performed at [facility], [place] [address] [place], [place] [address]    Report Status 12/19/2020 FINAL  Final  Culture,  blood (routine x 2)     Status: None (Preliminary result)   Collection Time: 12/16/20  1:36 PM   Specimen: BLOOD  Result Value Ref Range Status   Specimen Description   Final    BLOOD  RIGHT ANTECUBITAL Performed at [facility] [address], [place], [place] [address]    Special Requests   Final    BOTTLES DRAWN AEROBIC ONLY Blood Culture results may not be optimal due to an inadequate volume of blood received in culture bottles Performed at [facility] [address], [place], [place] [address]    Culture   Final    NO GROWTH 2 DAYS Performed at [facility] [address] [place], [place] [address]    Report Status PENDING  Incomplete     Pertinent Lab. CBC Latest Ref Rng & Units 12/19/2020 12/18/2020 12/17/2020  WBC 4.0 - 10.5 K/uL 3.7(L) 3.9(L) 4.0  Hemoglobin 12.0 - 15.0 g/dL 8.1(L) 8.0(L) 7.9(L)  Hematocrit 36.0 - 46.0 % 24.7(L) 24.2(L) 24.2(L)  Platelets 150 - 400 K/uL 172 159 162   CMP Latest Ref Rng & Units 12/19/2020 12/18/2020 12/17/2020  Glucose 70 - 99 mg/dL 114(H) 125(H) 140(H)  BUN 6 - 20 mg/dL 24(H) 20 25(H)  Creatinine 0.44 - 1.00 mg/dL 1.12(H) 1.07(H) 1.28(H)  Sodium 135 - 145 mmol/L 138 136 134(L)  Potassium 3.5 - 5.1 mmol/L 3.7 3.3(L) 3.7  Chloride 98 - 111 mmol/L 104 104 102  CO2 22 - 32 mmol/L 25 27 24   Calcium 8.9 - 10.3 mg/dL 9.0 8.8(L) 8.6(L)  Total Protein 6.5 - 8.1 g/dL 6.0(L) 5.8(L) 5.9(L)  Total Bilirubin 0.3 - 1.2 mg/dL 0.4 0.5 0.5  Alkaline Phos 38 - 126 U/L 117 128(H) 137(H)  AST 15 - 41 U/L 11(L) 13(L) 18  ALT 0 - 44 U/L 13 15 18     Pertinent Imaging today  TTE 12/17/20 Plain films and CT images have been personally visualized and interpreted; radiology reports have been reviewed. Decision making incorporated into the Impression / Recommendations.  IMPRESSIONS  1. Left ventricular ejection fraction, by estimation, is 60 to 65%. The  left ventricle has normal function. The left ventricle has  no regional  wall motion abnormalities. There is mild concentric left ventricular  hypertrophy and severe basal septal  hypertrophy.  2. Right ventricular systolic function is normal. The right ventricular  size is normal. There is normal pulmonary artery systolic pressure. The  estimated right ventricular systolic pressure is 84.6 mmHg.  3. The mitral valve is normal in structure. Mild mitral valve  regurgitation. No evidence of mitral stenosis.  4. The aortic valve is tricuspid. Aortic valve regurgitation is not  visualized. Mild aortic valve sclerosis is present, with no evidence of  aortic valve stenosis.  5. The inferior vena cava is normal in size with greater than 50%  respiratory variability, suggesting right atrial pressure of 3 mmHg.  6. Left atrial size was moderately dilated.   FINDINGS  Left Ventricle: Left ventricular ejection fraction, by estimation, is 60  to 65%. The left ventricle has normal function. The left ventricle has no  regional wall motion abnormalities. The left ventricular internal cavity  size was normal in size. There is  mild concentric left ventricular hypertrophy. Left ventricular diastolic  parameters are consistent with Grade II diastolic dysfunction  (pseudonormalization). Normal left ventricular filling pressure.   Right Ventricle: The right ventricular size is normal. No increase in  right ventricular wall thickness. Right ventricular systolic function is  normal. There is normal pulmonary artery systolic pressure. The tricuspid  regurgitant velocity is 2.41 m/s, and  with an assumed right atrial pressure of 3 mmHg, the estimated right  ventricular systolic pressure is 65.9 mmHg.  Left Atrium: Left atrial size was moderately dilated.   Right Atrium: Right atrial size was normal in size.   Pericardium: There is no evidence of pericardial effusion.   Mitral Valve: The mitral valve is normal in structure. Mild mitral valve   regurgitation. No evidence of mitral valve stenosis.   Tricuspid Valve: The tricuspid valve is normal in structure. Tricuspid  valve regurgitation is mild . No evidence of tricuspid stenosis.   Aortic Valve: The aortic valve is tricuspid. Aortic valve regurgitation is  not visualized. Mild aortic valve sclerosis is present, with no evidence  of aortic valve stenosis.   Pulmonic Valve: The pulmonic valve was normal in structure. Pulmonic valve  regurgitation is not visualized. No evidence of pulmonic stenosis.   Aorta: The aortic root is normal in size and structure.   Venous: The inferior vena cava is normal in size with greater than 50%  respiratory variability, suggesting right atrial pressure of 3 mmHg.   IAS/Shunts: No atrial level shunt detected by color flow Doppler.   I have spent approx 30 minutes for this patient encounter including review of prior medical records with greater than 50% of time being face to face and coordination of their care.  Electronically signed by:   Rosiland Oz, MD Infectious Disease Physician [facility] for [facility] Pager: [phone]

## 2020-12-19 NOTE — Progress Notes (Signed)
PROGRESS NOTE    Caitlin Peters  WOE:[id number] DOB: 08/10/1961 DOA: 12/15/2020 PCP: [name], [name], MD     Brief Narrative:  Caitlin Peters is a 60 y.o. BF PMHx depression, acute pancreatitis, choledocholithiasis, Crohn's dz, short gut syndrome, CKD stage III, HTN, on chronic TPN, chronic pain syndrome, multiple prior line infections due to being on TPN since 2003.  Mass in chest, drug-seeking behavior  Pt presents to ED with apparent witnessed seizure per daughter.  Pt initially confused but now mentating normally.  Pt was just discharged from hospital for incidental COVID 19 infection 12/08/2020 (received 3-day course remdesivir).   Thankfully mild COVID symptoms (probably due to vaccinated+boosted status), pt discharged 3 days later.  Denies cough, SOB.  Endorses nausea and abd discomfort (chronic)  Per daughter last had seizure ~1 decade ago.    Subjective: 1/24 afebrile overnight A/O x4, in good mood today.  Understands that she will need to wait until cultures from 1/23 result before she can be discharged.   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 - 1/23 discussed case with Dr. Tommy Medal ID states continue Daptomycin for 2 weeks, starting date will be when her cultures are negative.    Seizure - Per neurology recommendation: -1/21 EEG pending -1/21 Wellbutrin 200 mg daily (hold) may lower seizure threshold -1/21 UDS positive opiates: Patient on opiates at home - Inpatient seizure precautions; IV Ativan PRN for seizure lasting longer than 5 minutes - Outpatient seizure precautions:Per [facility] statutes, patients with seizures are not allowed to drive until they have been seizure-free for six months.  Use caution when using heavy equipment or power tools. Avoid working on ladders or at heights. Take showers instead of baths. Ensure the water temperature is not too high on the home water heater. Do not go swimming alone. Do not lock yourself in a room alone (i.e. bathroom). When caring for infants or small children, sit down when holding, feeding, or changing them to minimize risk of injury to the child in the event you have a seizure. Maintain good sleep hygiene. Avoid alcohol.  -1/22 Dr. Leonel Ramsay Neurology states: The description of the current event is very consistent with seizure, and with a history of a previous seizure, there is consideration of antiepileptic therapy. - 1/22 per Neurology Keppra 500 mg BID  Acute diastolic CHF - Since/echocardiogram 09/03/2020 patient has developed diastolic CHF see results below - Strict in and out - Daily weight  COVID infection COVID-19 Labs  Recent Labs    12/17/20 0500 12/18/20 0355 12/19/20 0405  DDIMER 0.90* 0.94* 0.91*  FERRITIN 70 55 49  LDH 93* 84* 82*  CRP 6.5* 4.1* 3.2*    Lab Results  Component Value Date   SARSCOV2NAA POSITIVE (A) 12/05/2020   [place] NEGATIVE 11/07/2020   [place] NEGATIVE 10/21/2020   [place] NEGATIVE 09/01/2020  -Patient was treated with Remdesivir x3 days and discharged on 12/08/2020 - Patient will be contagious until  12/26/2020, while in the hospital should be placed in airborne protection.  Short gut syndrome - Continue TPN  Chronic pain syndrome -Continue methadone and Dilaudid p.o. - 1/22 states that she fell in June and injured her back and now is flaring up and whenever she comes in the hospital she is given IV Dilaudid along with her other p.o. Dilaudid and methadone. - 1/22 Zanaflex PRN must be judicious secondary to CKD stage III -1/22 Toradol IV 30 mg x 1 PRN  Drug-seeking behavior - Follow patient on methadone, Dilaudid p.o. and states that whenever she comes in the hospital they gave her IV Dilaudid in addition to her p.o. medication. -Patient becomes very agitated when informed will not mix IV Dilaudid with p.o. Dilaudid and p.o. methadone  CKD stage III (baseline Cr ave 1.28) Lab Results  Component Value Date   CREATININE 1.12 (H) 12/19/2020   CREATININE 1.07 (H) 12/18/2020   CREATININE 1.28 (H) 12/17/2020   CREATININE 1.35 (H) 12/16/2020   CREATININE 1.39 (H) 12/16/2020  -At baseline  Previous fall/possible fractured ribs. - DG CXR; R/O left fractured ribs.  Patient fell and struck her rib cage on a chair this summer.  Ensure ribs have healed, given continued pain -1/24 no fractures see results below from DG chest 1/23  Hypokalemia - Potassium goal> 4  Hypomagnesmia - Magnesium goal> 2 - Magnesium in her TPN       DVT prophylaxis: Lovenox Code Status: Full Family Communication:  Status is: Inpatient    Dispo: The patient is from: Home              Anticipated d/c is to: Home              Anticipated d/c date is: 1/23              Patient currently unstable      Consultants:  Dr. Leonel Ramsay Neurology ID Dr. Tommy Medal  Procedures/Significant Events:  1/21 EEG: Suggestive of mild diffuse encephalopathy, nonspecific etiology. No seizures or epileptiform discharges were seen throughout the recording. 1/22 echocardiogram:Left Ventricle: Left ventricular  ejection fraction, by estimation, is 60  to 65%. The left ventricle has normal function. The left ventricle has no  regional wall motion abnormalities. The left ventricular internal cavity  size was normal in size. There is  mild concentric left ventricular hypertrophy. Left ventricular diastolic  parameters are consistent with Grade II diastolic dysfunction  (pseudonormalization). Normal left ventricular filling pressure.  1/23 DG chest 2 view; no acute intrathoracic process    I have personally reviewed and interpreted all radiology studies and my findings are as above.  VENTILATOR SETTINGS: Room air 1/24 SPO2 100%   Cultures 1/20 blood PICC line positive 2/2 staph epidermidis 1/20 blood LEFT arm positive 2/2 staph epidermidis 1/23 blood pending    Antimicrobials: Anti-infectives (From admission, onward)   Start     Ordered Stop   12/18/20 0200  vancomycin (VANCOCIN) IVPB 1000 mg/200 mL premix  Status:  Discontinued        12/17/20 0103 12/17/20 1643   12/17/20 2000  DAPTOmycin (CUBICIN) 500 mg in sodium chloride 0.9 % IVPB  12/17/20 1627     12/17/20 0130  vancomycin (VANCOREADY) IVPB 1250 mg/250 mL        12/17/20 0103 12/17/20 0409       Devices    LINES / TUBES:      Continuous Infusions: . DAPTOmycin (CUBICIN)  IV 500 mg (12/18/20 2255)  . lactated ringers 10 mL/hr at 12/16/20 2220  . TPN CYCLIC-ADULT (ION)       Objective: Vitals:   12/18/20 2039 12/19/20 0619 12/19/20 0955 12/19/20 1348  BP: (!) 154/73 (!) 147/81 (!) 157/82 (!) 148/69  Pulse: 67 64  60  Resp: 12 16  20   Temp: 98.6 F (37 C) 98.8 F (37.1 C)  98.3 F (36.8 C)  TempSrc: Oral Oral  Oral  SpO2: 98% 99%  100%  Weight:  60.4 kg    Height:        Intake/Output Summary (Last 24 hours) at 12/19/2020 1459 Last data filed at 12/19/2020 3614 Gross per 24 hour  Intake 1953.45 ml  Output -  Net 1953.45 ml   Filed Weights   12/15/20 1332 12/18/20 0500 12/19/20 4315  Weight:  61.2 kg 61 kg 60.4 kg    Examination:  General: A/O x4 No acute respiratory distress Eyes: negative scleral hemorrhage, negative anisocoria, negative icterus ENT: Negative Runny nose, negative gingival bleeding, Neck:  Negative scars, masses, torticollis, lymphadenopathy, JVD Lungs: Clear to auscultation bilaterally without wheezes or crackles Cardiovascular: Regular rate and rhythm without murmur gallop or rub normal S1 and S2, pain with palpation to lateral aspect of rib cage. Abdomen: negative abdominal pain, nondistended, positive soft, bowel sounds, no rebound, no ascites, no appreciable mass Extremities: No significant cyanosis, clubbing, or edema bilateral lower extremities, RIGHT femoral CVL negative sign of discharge, negative erythema, negative rubor. Skin: Negative rashes, lesions, ulcers Psychiatric:  Negative depression, negative anxiety, negative fatigue, negative mania  Central nervous system:  Cranial nerves II through XII intact, tongue/uvula midline, all extremities muscle strength 5/5, sensation intact throughout, negative dysarthria, negative expressive aphasia, negative receptive aphasia.  .     Data Reviewed: Care during the described time interval was provided by me .  I have reviewed this patient's available data, including medical history, events of note, physical examination, and all test results as part of my evaluation.  CBC: Recent Labs  Lab 12/16/20 0320 12/16/20 1215 12/17/20 0500 12/18/20 0355 12/19/20 0405  WBC 8.1 5.1 4.0 3.9* 3.7*  NEUTROABS 7.1 4.0 2.3 2.0 1.7  HGB 8.8* 8.0* 7.9* 8.0* 8.1*  HCT 26.8* 24.3* 24.2* 24.2* 24.7*  MCV 92.1 91.7 91.3 91.0 92.5  PLT 171 147* 162 159 400   Basic Metabolic Panel: Recent Labs  Lab 12/16/20 0320 12/16/20 1215 12/17/20 0500 12/18/20 0355 12/19/20 0405  NA 134* 134* 134* 136 138  K 4.2 3.6 3.7 3.3* 3.7  CL 107 104 102 104 104  CO2 19* 21* 24 27 25   GLUCOSE 75 117* 140* 125* 114*  BUN 32* 28*  25* 20 24*  CREATININE 1.39* 1.35* 1.28* 1.07* 1.12*  CALCIUM 9.2 8.8* 8.6* 8.8* 9.0  MG 2.0 1.9 2.0 1.8 1.9  PHOS 3.6 3.3 2.7 2.7 3.1   GFR: Estimated Creatinine Clearance: 51.6 mL/min (A) (by C-G formula based on SCr of 1.12 mg/dL (H)). Liver Function Tests: Recent Labs  Lab 12/16/20 0320 12/16/20 1215 12/17/20 0500 12/18/20 0355 12/19/20 0405  AST 24 22 18  13* 11*  ALT 22 21 18 15 13   ALKPHOS 184* 158* 137* 128* 117  BILITOT 0.9 0.8 0.5 0.5 0.4  PROT 6.6 6.2* 5.9* 5.8* 6.0*  ALBUMIN 3.1* 2.9* 2.6* 2.5* 2.6*   No results for input(s): LIPASE, AMYLASE in the last 168 hours. No results for input(s): AMMONIA in the last 168 hours. Coagulation Profile: No results for input(s): INR, PROTIME in the last 168 hours. Cardiac Enzymes: Recent Labs  Lab 12/18/20 0355  CKTOTAL 25*   BNP (last 3 results) No results for input(s): PROBNP in the last 8760 hours. HbA1C: No results for input(s): HGBA1C in the last 72 hours. CBG: Recent Labs  Lab 12/18/20 1155 12/18/20 1708 12/18/20 2211 12/19/20 0743 12/19/20 1130  GLUCAP 122* 92 111* 113* 116*   Lipid Profile: Recent Labs    12/19/20 0405  TRIG 51   Thyroid Function Tests: No results for input(s): TSH, T4TOTAL, FREET4, T3FREE, THYROIDAB in the last 72 hours. Anemia Panel: Recent Labs    12/18/20 0355 12/19/20 0405  FERRITIN 55 49   Sepsis Labs: Recent Labs  Lab 12/15/20 1517 12/16/20 1215 12/17/20 0500 12/18/20 0355  PROCALCITON  --  9.53 7.44 4.58  LATICACIDVEN 0.5  --   --   --     Recent Results (from the past 240 hour(s))  Culture, blood (routine x 2)     Status: Abnormal   Collection Time: 12/15/20  1:06 AM   Specimen: BLOOD  Result Value Ref Range Status   Specimen Description   Final    BLOOD LEFT ARM Performed at [facility] [address], [place], [place] [address]    Special Requests   Final    BOTTLES DRAWN AEROBIC AND ANAEROBIC Blood Culture adequate  volume Performed at [facility] [address] [place], [place] [address]    Culture  Setup Time   Final    IN BOTH AEROBIC AND ANAEROBIC BOTTLES GRAM POSITIVE COCCI CRITICAL VALUE NOTED.  VALUE IS CONSISTENT WITH PREVIOUSLY REPORTED AND CALLED VALUE.    Culture (A)  Final    STAPHYLOCOCCUS EPIDERMIDIS SUSCEPTIBILITIES PERFORMED ON PREVIOUS CULTURE WITHIN THE LAST 5 DAYS. Performed at [facility], [place] [address], [place], [place] [address]    Report Status 12/19/2020 FINAL  Final  Urine culture     Status: None   Collection Time: 12/15/20  5:38 PM   Specimen: Urine, Clean Catch  Result Value Ref Range Status   Specimen Description   Final    URINE, CLEAN CATCH Performed at [facility], [place] [address], [place], [place] [address]    Special Requests   Final    NONE Performed at [facility], [place] [address], [place], [place] [address]    Culture   Final    NO GROWTH Performed at [facility], [place] [address], [place], [place] [address]    Report Status 12/17/2020 FINAL  Final  Culture, blood (routine x 2)     Status: Abnormal   Collection Time: 12/15/20  7:16 PM   Specimen: BLOOD  Result Value Ref Range Status   Specimen Description   Final    BLOOD PICC LINE Performed at [facility] [address] [place], [place] [address]    Special Requests   Final    BOTTLES DRAWN AEROBIC AND ANAEROBIC Blood Culture adequate volume Performed at [facility] [address] [place], [place] [address]    Culture  Setup Time   Final    IN BOTH AEROBIC AND ANAEROBIC  BOTTLES GRAM POSITIVE COCCI CRITICAL RESULT CALLED TO, READ BACK BY AND VERIFIED WITH: [facility] 12/16/20 2330 JDW Performed at [facility], [place] [address] [place], [place] [address]    Culture STAPHYLOCOCCUS EPIDERMIDIS (A)  Final   Report Status 12/19/2020 FINAL  Final   Organism ID, Bacteria  STAPHYLOCOCCUS EPIDERMIDIS  Final      Susceptibility   Staphylococcus epidermidis - MIC*    CIPROFLOXACIN >=8 RESISTANT Resistant     ERYTHROMYCIN >=8 RESISTANT Resistant     GENTAMICIN >=16 RESISTANT Resistant     OXACILLIN >=4 RESISTANT Resistant     TETRACYCLINE 2 SENSITIVE Sensitive     VANCOMYCIN 1 SENSITIVE Sensitive     TRIMETH/SULFA 80 RESISTANT Resistant     CLINDAMYCIN >=8 RESISTANT Resistant     RIFAMPIN <=0.5 SENSITIVE Sensitive     Inducible Clindamycin NEGATIVE Sensitive     * STAPHYLOCOCCUS EPIDERMIDIS  Blood Culture ID Panel (Reflexed)     Status: Abnormal   Collection Time: 12/15/20  7:16 PM  Result Value Ref Range Status   Enterococcus faecalis NOT DETECTED NOT DETECTED Final   Enterococcus Faecium NOT DETECTED NOT DETECTED Final   Listeria monocytogenes NOT DETECTED NOT DETECTED Final   Staphylococcus species DETECTED (A) NOT DETECTED Final    Comment: CRITICAL RESULT CALLED TO, READ BACK BY AND VERIFIED WITH: L POINDEXTER PHARMD 12/16/20 2330 JDW    Staphylococcus aureus (BCID) NOT DETECTED NOT DETECTED Final   Staphylococcus epidermidis DETECTED (A) NOT DETECTED Final    Comment: Methicillin (oxacillin) resistant coagulase negative staphylococcus. Possible blood culture contaminant (unless isolated from more than one blood culture draw or clinical case suggests pathogenicity). No antibiotic treatment is indicated for blood  culture contaminants. CRITICAL RESULT CALLED TO, READ BACK BY AND VERIFIED WITH: L POINDEXTER PHARMD 12/16/20 2230 JDW    Staphylococcus lugdunensis NOT DETECTED NOT DETECTED Final   Streptococcus species NOT DETECTED NOT DETECTED Final   Streptococcus agalactiae NOT DETECTED NOT DETECTED Final   Streptococcus pneumoniae NOT DETECTED NOT DETECTED Final   Streptococcus pyogenes NOT DETECTED NOT DETECTED Final   A.calcoaceticus-baumannii NOT DETECTED NOT DETECTED Final   Bacteroides fragilis NOT DETECTED NOT DETECTED Final   Enterobacterales  NOT DETECTED NOT DETECTED Final   Enterobacter cloacae complex NOT DETECTED NOT DETECTED Final   Escherichia coli NOT DETECTED NOT DETECTED Final   Klebsiella aerogenes NOT DETECTED NOT DETECTED Final   Klebsiella oxytoca NOT DETECTED NOT DETECTED Final   Klebsiella pneumoniae NOT DETECTED NOT DETECTED Final   Proteus species NOT DETECTED NOT DETECTED Final   Salmonella species NOT DETECTED NOT DETECTED Final   Serratia marcescens NOT DETECTED NOT DETECTED Final   Haemophilus influenzae NOT DETECTED NOT DETECTED Final   Neisseria meningitidis NOT DETECTED NOT DETECTED Final   Pseudomonas aeruginosa NOT DETECTED NOT DETECTED Final   Stenotrophomonas maltophilia NOT DETECTED NOT DETECTED Final   Candida albicans NOT DETECTED NOT DETECTED Final   Candida auris NOT DETECTED NOT DETECTED Final   Candida glabrata NOT DETECTED NOT DETECTED Final   Candida krusei NOT DETECTED NOT DETECTED Final   Candida parapsilosis NOT DETECTED NOT DETECTED Final   Candida tropicalis NOT DETECTED NOT DETECTED Final   Cryptococcus neoformans/gattii NOT DETECTED NOT DETECTED Final   Methicillin resistance mecA/C DETECTED (A) NOT DETECTED Final    Comment: CRITICAL RESULT CALLED TO, READ BACK BY AND VERIFIED WITH: L POINDEXTER PHARMD 12/16/20 2330 JDW Performed at [facility], [address], [place], [place]  [address]   Culture, blood (routine x 2)     Status: Abnormal   Collection Time: 12/16/20 12:15 PM   Specimen: BLOOD  Result Value Ref Range Status   Specimen Description   Final    BLOOD FROM CVC Performed at [facility] [address] [place], [place] [address]    Special Requests   Final    BOTTLES DRAWN AEROBIC AND ANAEROBIC Blood Culture adequate volume Performed at [facility] [address] [place], [place] [address]    Culture  Setup Time   Final    IN BOTH AEROBIC AND ANAEROBIC BOTTLES GRAM POSITIVE COCCI CRITICAL VALUE NOTED.  VALUE IS  CONSISTENT WITH PREVIOUSLY REPORTED AND CALLED VALUE.    Culture (A)  Final    STAPHYLOCOCCUS EPIDERMIDIS SUSCEPTIBILITIES PERFORMED ON PREVIOUS CULTURE WITHIN THE LAST 5 DAYS. Performed at [facility], [place] [address], [place], [place] [address]    Report Status 12/19/2020 FINAL  Final  Culture, blood (routine x 2)     Status: None (Preliminary result)   Collection Time: 12/16/20  1:36 PM   Specimen: BLOOD  Result Value Ref Range Status   Specimen Description   Final    BLOOD RIGHT ANTECUBITAL Performed at [facility] [address], [place], [place] [address]    Special Requests   Final    BOTTLES DRAWN AEROBIC ONLY Blood Culture results may not be optimal due to an inadequate volume of blood received in culture bottles Performed at [facility] [address] [place], [place] [address]    Culture   Final    NO GROWTH 2 DAYS Performed at [facility] [address] [place], [place] [address]    Report Status PENDING  Incomplete         Radiology Studies: DG Chest 2 View  Result Date: 12/18/2020 CLINICAL DATA:  Left chest wall pain at site of previous trauma EXAM: CHEST - 2 VIEW COMPARISON:  12/16/2020 FINDINGS: Frontal and lateral views of the chest demonstrate an unremarkable cardiac silhouette. No airspace disease, effusion, or pneumothorax. No acute displaced fracture. IMPRESSION: 1. No acute intrathoracic process. Electronically Signed   By: Randa Ngo M.D.   On: 12/18/2020 18:51   ECHOCARDIOGRAM COMPLETE  Result Date: 12/17/2020    ECHOCARDIOGRAM REPORT   Patient Name:   Caitlin Peters Date of Exam: 12/17/2020 Medical Rec #:  [id number]        Height:       68.0 in Accession #:    [id number]       Weight:       134.9 lb Date of Birth:  23-Oct-1961        BSA:          1.729 m Patient Age:    79 years         BP:           162/85 mmHg Patient Gender: F                HR:           64 bpm. Exam Location:  Inpatient  Procedure: 2D Echo, Cardiac Doppler and Color Doppler Indications:    Bacteremia  History:        Patient has prior history of Echocardiogram examinations, most                 recent 09/03/2020. Arrythmias:PAC; Risk Factors:Former Smoker and  Hypertension. COVID-19 virus infection, Methadone dependence per                 chart.  Sonographer:    Alvino Chapel RCS Referring Phys: [id number] Protivin  1. Left ventricular ejection fraction, by estimation, is 60 to 65%. The left ventricle has normal function. The left ventricle has no regional wall motion abnormalities. There is mild concentric left ventricular hypertrophy and severe basal septal hypertrophy.  2. Right ventricular systolic function is normal. The right ventricular size is normal. There is normal pulmonary artery systolic pressure. The estimated right ventricular systolic pressure is 62.6 mmHg.  3. The mitral valve is normal in structure. Mild mitral valve regurgitation. No evidence of mitral stenosis.  4. The aortic valve is tricuspid. Aortic valve regurgitation is not visualized. Mild aortic valve sclerosis is present, with no evidence of aortic valve stenosis.  5. The inferior vena cava is normal in size with greater than 50% respiratory variability, suggesting right atrial pressure of 3 mmHg.  6. Left atrial size was moderately dilated. FINDINGS  Left Ventricle: Left ventricular ejection fraction, by estimation, is 60 to 65%. The left ventricle has normal function. The left ventricle has no regional wall motion abnormalities. The left ventricular internal cavity size was normal in size. There is  mild concentric left ventricular hypertrophy. Left ventricular diastolic parameters are consistent with Grade II diastolic dysfunction (pseudonormalization). Normal left ventricular filling pressure. Right Ventricle: The right ventricular size is normal. No increase in right ventricular wall thickness. Right ventricular systolic  function is normal. There is normal pulmonary artery systolic pressure. The tricuspid regurgitant velocity is 2.41 m/s, and  with an assumed right atrial pressure of 3 mmHg, the estimated right ventricular systolic pressure is 94.8 mmHg. Left Atrium: Left atrial size was moderately dilated. Right Atrium: Right atrial size was normal in size. Pericardium: There is no evidence of pericardial effusion. Mitral Valve: The mitral valve is normal in structure. Mild mitral valve regurgitation. No evidence of mitral valve stenosis. Tricuspid Valve: The tricuspid valve is normal in structure. Tricuspid valve regurgitation is mild . No evidence of tricuspid stenosis. Aortic Valve: The aortic valve is tricuspid. Aortic valve regurgitation is not visualized. Mild aortic valve sclerosis is present, with no evidence of aortic valve stenosis. Pulmonic Valve: The pulmonic valve was normal in structure. Pulmonic valve regurgitation is not visualized. No evidence of pulmonic stenosis. Aorta: The aortic root is normal in size and structure. Venous: The inferior vena cava is normal in size with greater than 50% respiratory variability, suggesting right atrial pressure of 3 mmHg. IAS/Shunts: No atrial level shunt detected by color flow Doppler.  LEFT VENTRICLE PLAX 2D LVIDd:         4.80 cm  Diastology LVIDs:         2.30 cm  LV e' medial:    6.85 cm/s LV PW:         1.20 cm  LV E/e' medial:  12.0 LV IVS:        1.50 cm  LV e' lateral:   8.59 cm/s LVOT diam:     2.20 cm  LV E/e' lateral: 9.5 LV SV:         125 LV SV Index:   73 LVOT Area:     3.80 cm  RIGHT VENTRICLE RV S prime:     10.60 cm/s TAPSE (M-mode): 1.8 cm LEFT ATRIUM             Index  RIGHT ATRIUM           Index LA diam:        4.90 cm 2.83 cm/m  RA Area:     13.70 cm LA Vol (A2C):   71.5 ml 41.36 ml/m RA Volume:   32.80 ml  18.97 ml/m LA Vol (A4C):   82.7 ml 47.83 ml/m LA Biplane Vol: 78.7 ml 45.52 ml/m  AORTIC VALVE LVOT Vmax:   159.00 cm/s LVOT Vmean:  97.900  cm/s LVOT VTI:    0.330 m  AORTA Ao Root diam: 3.20 cm MITRAL VALVE               TRICUSPID VALVE MV Area (PHT): 2.58 cm    TR Peak grad:   23.2 mmHg MV Decel Time: 294 msec    TR Vmax:        241.00 cm/s MV E velocity: 81.90 cm/s MV A velocity: 86.00 cm/s  SHUNTS MV E/A ratio:  0.95        Systemic VTI:  0.33 m                            Systemic Diam: 2.20 cm Fransico Him MD Electronically signed by Fransico Him MD Signature Date/Time: 12/17/2020/4:09:18 PM    Final         Scheduled Meds: . amLODipine  10 mg Oral Daily  . budesonide  3 mg Oral Daily  . Chlorhexidine Gluconate Cloth  6 each Topical Daily  . cycloSPORINE  1 drop Both Eyes BID  . DULoxetine  90 mg Oral Daily  . enoxaparin (LOVENOX) injection  40 mg Subcutaneous Q24H  . estradiol  2 mg Oral Daily  . hydrALAZINE  75 mg Oral TID  . levETIRAcetam  500 mg Oral BID  . lidocaine  1 patch Transdermal Q24H  . methadone  5 mg Oral 5 X Daily  . metoprolol tartrate  100 mg Oral BID  . pantoprazole  40 mg Oral Daily  . polyvinyl alcohol  1 drop Both Eyes QID  . sucralfate  1 g Oral TID WC & HS  . vancomycin 2.5 mg/ml + heparin 2500 units/ml  2.5 mL Intracatheter Q24H  . vancomycin 2.5 mg/ml + heparin 2500 units/ml  2.5 mL Intracatheter Q24H  . vitamin B-12  1,000 mcg Oral Daily   Continuous Infusions: . DAPTOmycin (CUBICIN)  IV 500 mg (12/18/20 2255)  . lactated ringers 10 mL/hr at 12/16/20 2220  . TPN CYCLIC-ADULT (ION)       LOS: 3 days    Time spent:40 min    WOODS, Geraldo Docker, MD [facility] Pager [phone]  If 7PM-7AM, please contact night-coverage [email or web address] Password [facility] 12/19/2020, 2:59 PM

## 2020-12-19 NOTE — Progress Notes (Signed)
PHARMACY - TOTAL PARENTERAL NUTRITION CONSULT NOTE   Indication: Short bowel syndrome  Patient Measurements: Height: 5' 8"  (172.7 cm) Weight: 60.4 kg (133 lb 3.2 oz) IBW/kg (Calculated) : 63.9 TPN AdjBW (KG): 61.2 Body mass index is 20.25 kg/m.  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 Recent admit and ED visits with Covid PNA. Now presenting with possible seizure. Plan is to admit for observation. Pharmacy to resume TPN.  Glucose / Insulin: CBGs < 150, not on SSI currently  Electrolytes: K+ 3.7, , Corr Ca WNL, Magnesium slightly low at 1.9, others WNL  Renal: CKD. SCr ok at 1.12, BUN 24 LFTs / TGs: WNL , trig 51 ( 1/24)  Prealbumin / albumin: Pre-albumin  low at 18.3 (1/24), albumin 2.6 ( 1/24)  Intake / Output; MIVF: UOP not measured, LR at 10 ml/hr GI Imaging: CT shows evidence of prior cholecystectomy and hysterectomy. Surgeries / Procedures:   Central access: CVC double lumen TPN start date: Chronic TPN  Nutritional Goals (per RD recommendation on 1/21): Kcal:  1900-2100 kcal Protein:  90-105 grams Fluid:  >/= 2.5 L/day  Chronic PTA TPN is from [place].  Infuse 1800 mL over 12 hrs. Provides 85g of amino acids, 230g of dextrose, and SMOFlipid 40g only 3x/week  Current Nutrition:  Heart diet (unsure of absorption)  Plan:  KCl 10 mEq IV x 2 Magnesium sulfate 2 gr IV x1    At 1800: Infuse 1529m over 18 hrs: 44 mL/hr x 1 hr, then 88 mL/hr x 16 hrs, then 44 mL/hr x 1 hr.  TTSS TPN will provide 87 g of protein, 240 g of dextrose, and 0 g of lipids for a total of 1164 kcals  MWF TPN will provide 87 g of protein, 240 g of dextrose, and 50 g of lipids for a total of 1,660 kcals  Electrolytes in TPN: 1028m/L of Na, incr to 6071mL of K, 0mE53m of Ca, 8 mEq/L of Mg, and 15mm54m of Phos. Cl:Ac ratio 1:2  Add standard MVI and trace elements to TPN  Check CBGs 3x per day around cyclic TPN  LR to 10ml/38VF/IEght to maintain  ~1.8 L of fluid per day  BMP, magnesium, phosphorus with AM labs   Monitor TPN labs on Mon/Thurs       NikolRoyetta AsalrmD, BCPS 12/19/2020 11:14 AM

## 2020-12-19 NOTE — Care Management Important Message (Signed)
Medicare important message printed for Cookie McGibboney, NCM to give to the patient.

## 2020-12-20 DIAGNOSIS — T80211A Bloodstream infection due to central venous catheter, initial encounter: Secondary | ICD-10-CM | POA: Diagnosis not present

## 2020-12-20 DIAGNOSIS — F419 Anxiety disorder, unspecified: Secondary | ICD-10-CM | POA: Diagnosis not present

## 2020-12-20 DIAGNOSIS — G894 Chronic pain syndrome: Secondary | ICD-10-CM | POA: Diagnosis not present

## 2020-12-20 DIAGNOSIS — R569 Unspecified convulsions: Secondary | ICD-10-CM | POA: Diagnosis not present

## 2020-12-20 DIAGNOSIS — R509 Fever, unspecified: Secondary | ICD-10-CM | POA: Diagnosis not present

## 2020-12-20 DIAGNOSIS — K8050 Calculus of bile duct without cholangitis or cholecystitis without obstruction: Secondary | ICD-10-CM | POA: Diagnosis not present

## 2020-12-20 LAB — FERRITIN: Ferritin: 46 ng/mL (ref 11–307)

## 2020-12-20 LAB — CBC WITH DIFFERENTIAL/PLATELET
Abs Immature Granulocytes: 0.01 10*3/uL (ref 0.00–0.07)
Basophils Absolute: 0 10*3/uL (ref 0.0–0.1)
Basophils Relative: 0 %
Eosinophils Absolute: 0.1 10*3/uL (ref 0.0–0.5)
Eosinophils Relative: 2 %
HCT: 22.9 % — ABNORMAL LOW (ref 36.0–46.0)
Hemoglobin: 7.7 g/dL — ABNORMAL LOW (ref 12.0–15.0)
Immature Granulocytes: 0 %
Lymphocytes Relative: 46 %
Lymphs Abs: 1.6 10*3/uL (ref 0.7–4.0)
MCH: 30.7 pg (ref 26.0–34.0)
MCHC: 33.6 g/dL (ref 30.0–36.0)
MCV: 91.2 fL (ref 80.0–100.0)
Monocytes Absolute: 0.3 10*3/uL (ref 0.1–1.0)
Monocytes Relative: 8 %
Neutro Abs: 1.6 10*3/uL — ABNORMAL LOW (ref 1.7–7.7)
Neutrophils Relative %: 44 %
Platelets: 157 10*3/uL (ref 150–400)
RBC: 2.51 MIL/uL — ABNORMAL LOW (ref 3.87–5.11)
RDW: 13.7 % (ref 11.5–15.5)
WBC: 3.6 10*3/uL — ABNORMAL LOW (ref 4.0–10.5)
nRBC: 0 % (ref 0.0–0.2)

## 2020-12-20 LAB — D-DIMER, QUANTITATIVE: D-Dimer, Quant: 0.79 ug/mL-FEU — ABNORMAL HIGH (ref 0.00–0.50)

## 2020-12-20 LAB — PHOSPHORUS: Phosphorus: 2.8 mg/dL (ref 2.5–4.6)

## 2020-12-20 LAB — COMPREHENSIVE METABOLIC PANEL
ALT: 12 U/L (ref 0–44)
AST: 11 U/L — ABNORMAL LOW (ref 15–41)
Albumin: 2.7 g/dL — ABNORMAL LOW (ref 3.5–5.0)
Alkaline Phosphatase: 120 U/L (ref 38–126)
Anion gap: 10 (ref 5–15)
BUN: 28 mg/dL — ABNORMAL HIGH (ref 6–20)
CO2: 25 mmol/L (ref 22–32)
Calcium: 9 mg/dL (ref 8.9–10.3)
Chloride: 103 mmol/L (ref 98–111)
Creatinine, Ser: 1.15 mg/dL — ABNORMAL HIGH (ref 0.44–1.00)
GFR, Estimated: 55 mL/min — ABNORMAL LOW (ref >60)
Glucose, Bld: 106 mg/dL — ABNORMAL HIGH (ref 70–99)
Potassium: 3.8 mmol/L (ref 3.5–5.1)
Sodium: 138 mmol/L (ref 135–145)
Total Bilirubin: 0.4 mg/dL (ref 0.3–1.2)
Total Protein: 6.1 g/dL — ABNORMAL LOW (ref 6.5–8.1)

## 2020-12-20 LAB — C-REACTIVE PROTEIN: CRP: 2.2 mg/dL — ABNORMAL HIGH (ref <1.0)

## 2020-12-20 LAB — GLUCOSE, CAPILLARY
Glucose-Capillary: 103 mg/dL — ABNORMAL HIGH (ref 70–99)
Glucose-Capillary: 100 mg/dL — ABNORMAL HIGH (ref 70–99)
Glucose-Capillary: 83 mg/dL (ref 70–99)
Glucose-Capillary: 103 mg/dL — ABNORMAL HIGH (ref 70–99)

## 2020-12-20 LAB — MAGNESIUM: Magnesium: 2.3 mg/dL (ref 1.7–2.4)

## 2020-12-20 LAB — LACTATE DEHYDROGENASE: LDH: 80 U/L — ABNORMAL LOW (ref 98–192)

## 2020-12-20 MED ORDER — TRACE MINERALS CU-MN-SE-ZN 300-55-60-3000 MCG/ML IV SOLN
INTRAVENOUS | Status: AC
  Filled 2020-12-20: qty 580

## 2020-12-20 MED ORDER — HYDRALAZINE HCL 50 MG PO TABS
100.0000 mg | ORAL_TABLET | Freq: Three times a day (TID) | ORAL | Status: DC
  Administered 2020-12-20 – 2020-12-21 (×3): 100 mg via ORAL
  Filled 2020-12-20 (×3): qty 2

## 2020-12-20 NOTE — Plan of Care (Signed)

## 2020-12-20 NOTE — Progress Notes (Signed)
PROGRESS NOTE    Caitlin Peters  ZMC:[id number] DOB: 22-Feb-1961 DOA: 12/15/2020 PCP: [name], [name], MD     Brief Narrative:  Caitlin Peters is a 60 y.o. BF PMHx depression, acute pancreatitis, choledocholithiasis, Crohn's dz, short gut syndrome, CKD stage III, HTN, on chronic TPN, chronic pain syndrome, multiple prior line infections due to being on TPN since 2003.  Mass in chest, drug-seeking behavior  Pt presents to ED with apparent witnessed seizure per daughter.  Pt initially confused but now mentating normally.  Pt was just discharged from hospital for incidental COVID 19 infection 12/08/2020 (received 3-day course remdesivir).   Thankfully mild COVID symptoms (probably due to vaccinated+boosted status), pt discharged 3 days later.  Denies cough, SOB.  Endorses nausea and abd discomfort (chronic)  Per daughter last had seizure ~1 decade ago.    Subjective: 1/25 afebrile overnight, A/O x4, understands that she will have to remain until cultures return negative final.   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 - 1/23 discussed case with Dr.  Tommy Medal ID states continue Daptomycin for 2 weeks, starting date will be when her cultures are negative.    Seizure - Per neurology recommendation: -1/21 EEG pending -1/21 Wellbutrin 200 mg daily (hold) may lower seizure threshold -1/21 UDS positive opiates: Patient on opiates at home - Inpatient seizure precautions; IV Ativan PRN for seizure lasting longer than 5 minutes - Outpatient seizure precautions:Per [facility] statutes, patients with seizures are not allowed to drive until they have been seizure-free for six months.  Use caution when using heavy equipment or power tools. Avoid working on ladders or at heights. Take showers instead of baths. Ensure the water temperature is not too high on the home water heater. Do not go swimming alone. Do not lock yourself in a room alone (i.e. bathroom). When caring for infants or small children, sit down when holding, feeding, or changing them to minimize risk of injury to the child in the event you have a seizure. Maintain good sleep hygiene. Avoid alcohol.  -1/22 Dr. Leonel Ramsay Neurology states: The description of the current event is very consistent with seizure, and with a history of a previous seizure, there is consideration of antiepileptic therapy. - 1/22 per Neurology Keppra 500 mg BID  Acute diastolic CHF - Since/echocardiogram 09/03/2020 patient has developed diastolic CHF see results below - Strict in and out +7.9 L - Daily weight Filed Weights   12/18/20 0500 12/19/20 0619 12/20/20 0500  Weight: 61 kg 60.4 kg 62.9 kg  -Amlodipine 10 mg daily -1/25 increase Hydralazine 100 mg TID -Metoprolol 100 mg BID  Essential HTN -  See CHF  COVID infection COVID-19 Labs  Recent Labs    12/18/20 0355 12/19/20 0405 12/20/20 0436  DDIMER 0.94* 0.91* 0.79*  FERRITIN 55 49 46  LDH 84* 82* 80*  CRP 4.1* 3.2* 2.2*    Lab Results  Component Value Date   SARSCOV2NAA POSITIVE (A) 12/05/2020   [place] NEGATIVE 11/07/2020    [place] NEGATIVE 10/21/2020   [place] NEGATIVE 09/01/2020  -Patient was treated with Remdesivir x3 days and discharged on 12/08/2020 - Patient will be contagious until 12/26/2020, while in the hospital should be placed in airborne protection.  Short gut syndrome - Continue TPN  Chronic pain syndrome -Continue methadone and Dilaudid p.o. - 1/22 states that she fell in June and injured her back and now is flaring up and whenever she comes in the hospital she is given IV Dilaudid along with her other p.o. Dilaudid and methadone. - 1/22 Zanaflex PRN must be judicious secondary to CKD stage III -1/22 Toradol IV 30 mg x 1 PRN  Drug-seeking behavior - Follow patient on methadone, Dilaudid p.o. and states that whenever she comes in the hospital they gave her IV Dilaudid in addition to her p.o. medication. -Patient becomes very agitated when informed will not mix IV Dilaudid with p.o. Dilaudid and p.o. methadone  CKD stage III (baseline Cr ave 1.28) Lab Results  Component Value Date   CREATININE 1.15 (H) 12/20/2020   CREATININE 1.12 (H) 12/19/2020   CREATININE 1.07 (H) 12/18/2020   CREATININE 1.28 (H) 12/17/2020   CREATININE 1.35 (H) 12/16/2020  -At baseline  Previous fall/possible fractured ribs. - DG CXR; R/O left fractured ribs.  Patient fell and struck her rib cage on a chair this summer.  Ensure ribs have healed, given continued pain -1/24 no fractures see results below from DG chest 1/23  Hypokalemia - Potassium goal> 4  Hypomagnesmia - Magnesium goal> 2 - Magnesium in her TPN       DVT prophylaxis: Lovenox Code Status: Full Family Communication:  Status is: Inpatient    Dispo: The patient is from: Home              Anticipated d/c is to: Home              Anticipated d/c date is: 1/23              Patient currently unstable      Consultants:  Dr. Leonel Ramsay Neurology ID Dr. Tommy Medal   Procedures/Significant Events:  1/21 EEG: Suggestive of mild  diffuse encephalopathy, nonspecific etiology. No seizures or epileptiform discharges were seen throughout the recording. 1/22 echocardiogram:Left Ventricle: Left ventricular ejection fraction, by estimation, is 60  to 65%. The left ventricle has normal function. The left ventricle has no  regional wall motion abnormalities. The left ventricular internal cavity  size was normal in size. There is  mild concentric left ventricular hypertrophy. Left ventricular diastolic  parameters are consistent with Grade II diastolic dysfunction  (pseudonormalization). Normal left ventricular filling pressure.  1/23 DG chest 2 view; no acute intrathoracic process    I have personally reviewed and interpreted all radiology studies and my findings are as above.  VENTILATOR SETTINGS: Room air 1/25 SPO2 100%   Cultures 1/20 blood PICC line positive 2/2 staph epidermidis 1/20 blood LEFT arm positive 2/2 staph epidermidis 1/23 blood Right forearm NGTD 1/23 blood right hand NGTD    Antimicrobials: Anti-infectives (From admission, onward)   Start     Ordered Stop   12/18/20 0200  vancomycin (  VANCOCIN) IVPB 1000 mg/200 mL premix  Status:  Discontinued        12/17/20 0103 12/17/20 1643   12/17/20 2000  DAPTOmycin (CUBICIN) 500 mg in sodium chloride 0.9 % IVPB        12/17/20 1627     12/17/20 0130  vancomycin (VANCOREADY) IVPB 1250 mg/250 mL        12/17/20 0103 12/17/20 0409       Devices    LINES / TUBES:      Continuous Infusions: . DAPTOmycin (CUBICIN)  IV 500 mg (12/19/20 1958)  . lactated ringers 10 mL/hr at 12/19/20 1836  . TPN CYCLIC-ADULT (ION)       Objective: Vitals:   12/20/20 0500 12/20/20 0551 12/20/20 1105 12/20/20 1136  BP:  (!) 149/69  (!) 153/70  Pulse:  69 70   Resp:  12 18   Temp:  98.3 F (36.8 C) 98.2 F (36.8 C)   TempSrc:  Oral Oral   SpO2:  100% 100%   Weight: 62.9 kg     Height:        Intake/Output Summary (Last 24 hours) at 12/20/2020 1616 Last  data filed at 12/20/2020 1000 Gross per 24 hour  Intake 1758.25 ml  Output -  Net 1758.25 ml   Filed Weights   12/18/20 0500 12/19/20 0619 12/20/20 0500  Weight: 61 kg 60.4 kg 62.9 kg    Examination:  General: A/O x4 No acute respiratory distress Eyes: negative scleral hemorrhage, negative anisocoria, negative icterus ENT: Negative Runny nose, negative gingival bleeding, Neck:  Negative scars, masses, torticollis, lymphadenopathy, JVD Lungs: Clear to auscultation bilaterally without wheezes or crackles Cardiovascular: Regular rate and rhythm without murmur gallop or rub normal S1 and S2, pain with palpation to lateral aspect of rib cage. Abdomen: negative abdominal pain, nondistended, positive soft, bowel sounds, no rebound, no ascites, no appreciable mass Extremities: No significant cyanosis, clubbing, or edema bilateral lower extremities, RIGHT femoral CVL negative sign of discharge, negative erythema, negative rubor. Skin: Negative rashes, lesions, ulcers Psychiatric:  Negative depression, negative anxiety, negative fatigue, negative mania  Central nervous system:  Cranial nerves II through XII intact, tongue/uvula midline, all extremities muscle strength 5/5, sensation intact throughout, negative dysarthria, negative expressive aphasia, negative receptive aphasia.  .     Data Reviewed: Care during the described time interval was provided by me .  I have reviewed this patient's available data, including medical history, events of note, physical examination, and all test results as part of my evaluation.  CBC: Recent Labs  Lab 12/16/20 1215 12/17/20 0500 12/18/20 0355 12/19/20 0405 12/20/20 0436  WBC 5.1 4.0 3.9* 3.7* 3.6*  NEUTROABS 4.0 2.3 2.0 1.7 1.6*  HGB 8.0* 7.9* 8.0* 8.1* 7.7*  HCT 24.3* 24.2* 24.2* 24.7* 22.9*  MCV 91.7 91.3 91.0 92.5 91.2  PLT 147* 162 159 172 734   Basic Metabolic Panel: Recent Labs  Lab 12/16/20 1215 12/17/20 0500 12/18/20 0355  12/19/20 0405 12/20/20 0436  NA 134* 134* 136 138 138  K 3.6 3.7 3.3* 3.7 3.8  CL 104 102 104 104 103  CO2 21* 24 27 25 25   GLUCOSE 117* 140* 125* 114* 106*  BUN 28* 25* 20 24* 28*  CREATININE 1.35* 1.28* 1.07* 1.12* 1.15*  CALCIUM 8.8* 8.6* 8.8* 9.0 9.0  MG 1.9 2.0 1.8 1.9 2.3  PHOS 3.3 2.7 2.7 3.1 2.8   GFR: Estimated Creatinine Clearance: 52.3 mL/min (A) (by C-G formula based on SCr of 1.15 mg/dL (H)). Liver  Function Tests: Recent Labs  Lab 12/16/20 1215 12/17/20 0500 12/18/20 0355 12/19/20 0405 12/20/20 0436  AST 22 18 13* 11* 11*  ALT 21 18 15 13 12   ALKPHOS 158* 137* 128* 117 120  BILITOT 0.8 0.5 0.5 0.4 0.4  PROT 6.2* 5.9* 5.8* 6.0* 6.1*  ALBUMIN 2.9* 2.6* 2.5* 2.6* 2.7*   No results for input(s): LIPASE, AMYLASE in the last 168 hours. No results for input(s): AMMONIA in the last 168 hours. Coagulation Profile: No results for input(s): INR, PROTIME in the last 168 hours. Cardiac Enzymes: Recent Labs  Lab 12/18/20 0355  CKTOTAL 25*   BNP (last 3 results) No results for input(s): PROBNP in the last 8760 hours. HbA1C: No results for input(s): HGBA1C in the last 72 hours. CBG: Recent Labs  Lab 12/19/20 1130 12/19/20 1647 12/20/20 0745 12/20/20 1105 12/20/20 1544  GLUCAP 116* 91 103* 100* 83   Lipid Profile: Recent Labs    12/19/20 0405  TRIG 51   Thyroid Function Tests: No results for input(s): TSH, T4TOTAL, FREET4, T3FREE, THYROIDAB in the last 72 hours. Anemia Panel: Recent Labs    12/19/20 0405 12/20/20 0436  FERRITIN 49 46   Sepsis Labs: Recent Labs  Lab 12/15/20 1517 12/16/20 1215 12/17/20 0500 12/18/20 0355  PROCALCITON  --  9.53 7.44 4.58  LATICACIDVEN 0.5  --   --   --     Recent Results (from the past 240 hour(s))  Culture, blood (routine x 2)     Status: Abnormal   Collection Time: 12/15/20  1:06 AM   Specimen: BLOOD  Result Value Ref Range Status   Specimen Description   Final    BLOOD LEFT ARM Performed at [facility] [address] [place], [place] [address]    Special Requests   Final    BOTTLES DRAWN AEROBIC AND ANAEROBIC Blood Culture adequate volume Performed at [facility] [address] [place], [place] [address]    Culture  Setup Time   Final    IN BOTH AEROBIC AND ANAEROBIC BOTTLES GRAM POSITIVE COCCI CRITICAL VALUE NOTED.  VALUE IS CONSISTENT WITH PREVIOUSLY REPORTED AND CALLED VALUE.    Culture (A)  Final    STAPHYLOCOCCUS EPIDERMIDIS SUSCEPTIBILITIES PERFORMED ON PREVIOUS CULTURE WITHIN THE LAST 5 DAYS. Performed at [facility], [place] [address], [place], [place] [address]    Report Status 12/19/2020 FINAL  Final  Urine culture     Status: None   Collection Time: 12/15/20  5:38 PM   Specimen: Urine, Clean Catch  Result Value Ref Range Status   Specimen Description   Final    URINE, CLEAN CATCH Performed at [facility], [place] [address], [place], [place] [address]    Special Requests   Final    NONE Performed at [facility], [place] [address], [place], [place] [address]    Culture   Final    NO GROWTH Performed at [facility], [place] [address], [place], [place] [address]    Report Status 12/17/2020 FINAL  Final  Culture, blood (routine x 2)     Status: Abnormal   Collection Time: 12/15/20  7:16 PM   Specimen: BLOOD  Result Value Ref Range Status   Specimen Description   Final    BLOOD PICC LINE Performed at [facility] [address], [place],  [address]    Special Requests   Final    BOTTLES DRAWN  AEROBIC AND ANAEROBIC Blood Culture adequate volume Performed at [facility] [address], [place], [place] [address]    Culture  Setup Time   Final    IN BOTH AEROBIC AND ANAEROBIC BOTTLES GRAM POSITIVE COCCI CRITICAL RESULT CALLED TO, READ BACK BY AND VERIFIED WITH: Elenore Paddy [facility] 12/16/20 2330 JDW Performed at [facility], [place] [address], [place], [place] [address]    Culture STAPHYLOCOCCUS EPIDERMIDIS (A)  Final   Report Status 12/19/2020 FINAL  Final   Organism ID, Bacteria STAPHYLOCOCCUS EPIDERMIDIS  Final      Susceptibility   Staphylococcus epidermidis - MIC*    CIPROFLOXACIN >=8 RESISTANT Resistant     ERYTHROMYCIN >=8 RESISTANT Resistant     GENTAMICIN >=16 RESISTANT Resistant     OXACILLIN >=4 RESISTANT Resistant     TETRACYCLINE 2 SENSITIVE Sensitive     VANCOMYCIN 1 SENSITIVE Sensitive     TRIMETH/SULFA 80 RESISTANT Resistant     CLINDAMYCIN >=8 RESISTANT Resistant     RIFAMPIN <=0.5 SENSITIVE Sensitive     Inducible Clindamycin NEGATIVE Sensitive     * STAPHYLOCOCCUS EPIDERMIDIS  Blood Culture ID Panel (Reflexed)     Status: Abnormal   Collection Time: 12/15/20  7:16 PM  Result Value Ref Range Status   Enterococcus faecalis NOT DETECTED NOT DETECTED Final   Enterococcus Faecium NOT DETECTED NOT DETECTED Final   Listeria monocytogenes NOT DETECTED NOT DETECTED Final   Staphylococcus species DETECTED (A) NOT DETECTED Final    Comment: CRITICAL RESULT CALLED TO, READ BACK BY AND VERIFIED WITH: L POINDEXTER PHARMD 12/16/20 2330 JDW    Staphylococcus aureus (BCID) NOT DETECTED NOT DETECTED Final   Staphylococcus epidermidis DETECTED (A) NOT DETECTED Final    Comment: Methicillin (oxacillin) resistant coagulase negative staphylococcus. Possible blood culture contaminant (unless isolated from more than one blood culture draw or clinical case suggests pathogenicity). No antibiotic treatment is indicated for blood  culture contaminants. CRITICAL RESULT CALLED TO, READ BACK BY AND VERIFIED WITH: L POINDEXTER PHARMD 12/16/20 2230 JDW    Staphylococcus lugdunensis NOT DETECTED NOT DETECTED Final   Streptococcus species NOT DETECTED NOT DETECTED Final   Streptococcus agalactiae NOT DETECTED NOT DETECTED Final   Streptococcus pneumoniae NOT DETECTED NOT DETECTED Final   Streptococcus pyogenes  NOT DETECTED NOT DETECTED Final   A.calcoaceticus-baumannii NOT DETECTED NOT DETECTED Final   Bacteroides fragilis NOT DETECTED NOT DETECTED Final   Enterobacterales NOT DETECTED NOT DETECTED Final   Enterobacter cloacae complex NOT DETECTED NOT DETECTED Final   Escherichia coli NOT DETECTED NOT DETECTED Final   Klebsiella aerogenes NOT DETECTED NOT DETECTED Final   Klebsiella oxytoca NOT DETECTED NOT DETECTED Final   Klebsiella pneumoniae NOT DETECTED NOT DETECTED Final   Proteus species NOT DETECTED NOT DETECTED Final   Salmonella species NOT DETECTED NOT DETECTED Final   Serratia marcescens NOT DETECTED NOT DETECTED Final   Haemophilus influenzae NOT DETECTED NOT DETECTED Final   Neisseria meningitidis NOT DETECTED NOT DETECTED Final   Pseudomonas aeruginosa NOT DETECTED NOT DETECTED Final   Stenotrophomonas maltophilia NOT DETECTED NOT DETECTED Final   Candida albicans NOT DETECTED NOT DETECTED Final   Candida auris NOT DETECTED NOT DETECTED Final   Candida glabrata NOT DETECTED NOT DETECTED Final   Candida krusei NOT DETECTED NOT DETECTED Final   Candida parapsilosis NOT DETECTED NOT DETECTED Final   Candida tropicalis NOT DETECTED NOT DETECTED Final   Cryptococcus neoformans/gattii NOT DETECTED NOT DETECTED Final   Methicillin resistance  mecA/C DETECTED (A) NOT DETECTED Final    Comment: CRITICAL RESULT CALLED TO, READ BACK BY AND VERIFIED WITH: L POINDEXTER PHARMD 12/16/20 2330 JDW Performed at [facility], [address], [place], [place] [address]   Culture, blood (routine x 2)     Status: Abnormal   Collection Time: 12/16/20 12:15 PM   Specimen: BLOOD  Result Value Ref Range Status   Specimen Description   Final    BLOOD FROM CVC Performed at [facility] [address], [place], [place] [address]    Special Requests   Final    BOTTLES DRAWN AEROBIC AND ANAEROBIC Blood Culture adequate volume Performed at [facility] [address] [place], [place] [address]    Culture  Setup Time   Final    IN BOTH AEROBIC AND ANAEROBIC BOTTLES GRAM POSITIVE COCCI CRITICAL VALUE NOTED.  VALUE IS CONSISTENT WITH PREVIOUSLY REPORTED AND CALLED VALUE.    Culture (A)  Final    STAPHYLOCOCCUS EPIDERMIDIS SUSCEPTIBILITIES PERFORMED ON PREVIOUS CULTURE WITHIN THE LAST 5 DAYS. Performed at [facility], [place] [address], [place], [place] [address]    Report Status 12/19/2020 FINAL  Final  Culture, blood (routine x 2)     Status: None (Preliminary result)   Collection Time: 12/16/20  1:36 PM   Specimen: BLOOD  Result Value Ref Range Status   Specimen Description   Final    BLOOD RIGHT ANTECUBITAL Performed at [facility] [address], [place], [place] [address]    Special Requests   Final    BOTTLES DRAWN AEROBIC ONLY Blood Culture results may not be optimal due to an inadequate volume of blood received in culture bottles Performed at [facility] [address] [place], [place] [address]    Culture   Final    NO GROWTH 4 DAYS Performed at [facility], [place] [address], [place], [place] [address]    Report Status PENDING  Incomplete  Culture, blood (Routine X 2) w Reflex to ID Panel     Status: None (Preliminary result)   Collection Time: 12/18/20  7:38 PM   Specimen: BLOOD RIGHT FOREARM  Result Value Ref Range Status   Specimen Description   Final    BLOOD RIGHT FOREARM Performed at [facility] [address], [place], [place] [address]    Special Requests   Final    BOTTLES DRAWN AEROBIC ONLY Blood Culture results may not be optimal due to an excessive volume of blood received in culture bottles Performed at [facility] [address] [place], [place] [address]    Culture   Final    NO GROWTH 2 DAYS Performed at [facility] [address] [place] [address], [place], [place] [address]    Report Status PENDING  Incomplete   Culture, blood (Routine X 2) w Reflex to ID Panel     Status: None (Preliminary result)   Collection Time: 12/18/20  7:38 PM   Specimen: BLOOD RIGHT HAND  Result Value Ref Range Status   Specimen Description   Final    BLOOD RIGHT HAND Performed at [facility] [address] [place],  [address]    Special Requests   Final    BOTTLES DRAWN AEROBIC ONLY Blood Culture results may not be optimal due to an excessive volume of blood received in culture bottles Performed at [facility]  [address] [place], [place] [address]    Culture   Final    NO GROWTH 2 DAYS Performed at [facility] [address], [place], [place] [address]    Report Status PENDING  Incomplete         Radiology Studies: DG Chest 2 View  Result Date: 12/18/2020 CLINICAL DATA:  Left chest wall pain at site of previous trauma EXAM: CHEST - 2 VIEW COMPARISON:  12/16/2020 FINDINGS: Frontal and lateral views of the chest demonstrate an unremarkable cardiac silhouette. No airspace disease, effusion, or pneumothorax. No acute displaced fracture. IMPRESSION: 1. No acute intrathoracic process. Electronically Signed   By: Randa Ngo M.D.   On: 12/18/2020 18:51        Scheduled Meds: . amLODipine  10 mg Oral Daily  . budesonide  3 mg Oral Daily  . Chlorhexidine Gluconate Cloth  6 each Topical Daily  . cycloSPORINE  1 drop Both Eyes BID  . DULoxetine  90 mg Oral Daily  . enoxaparin (LOVENOX) injection  40 mg Subcutaneous Q24H  . estradiol  2 mg Oral Daily  . hydrALAZINE  75 mg Oral TID  . levETIRAcetam  500 mg Oral BID  . lidocaine  1 patch Transdermal Q24H  . methadone  5 mg Oral 5 X Daily  . metoprolol tartrate  100 mg Oral BID  . pantoprazole  40 mg Oral Daily  . polyvinyl alcohol  1 drop Both Eyes QID  . sucralfate  1 g Oral TID WC & HS  . vancomycin 2.5 mg/ml + heparin 2500 units/ml  2.5 mL Intracatheter Q24H  . vancomycin 2.5 mg/ml + heparin  2500 units/ml  2.5 mL Intracatheter Q24H  . vitamin B-12  1,000 mcg Oral Daily   Continuous Infusions: . DAPTOmycin (CUBICIN)  IV 500 mg (12/19/20 1958)  . lactated ringers 10 mL/hr at 12/19/20 1836  . TPN CYCLIC-ADULT (ION)       LOS: 4 days    Time spent:40 min    Ulah Olmo, Geraldo Docker, MD [facility] Pager [phone]  If 7PM-7AM, please contact night-coverage [email or web address] Password [facility] 12/20/2020, 4:16 PM

## 2020-12-20 NOTE — Progress Notes (Signed)
PHARMACY - TOTAL PARENTERAL NUTRITION CONSULT NOTE   Indication: Short bowel syndrome  Patient Measurements: Height: 5' 8"  (172.7 cm) Weight: 62.9 kg (138 lb 10.7 oz) IBW/kg (Calculated) : 63.9 TPN AdjBW (KG): 61.2 Body mass index is 21.08 kg/m.  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 Recent admit and ED visits with Covid PNA. Now presenting with possible seizure. Plan is to admit for observation. Pharmacy to resume TPN.  Glucose / Insulin: CBGs < 150, not on SSI currently  Electrolytes: Electrolytes WNL, K and Mg in TPN increased yesterday  Renal: CKD. SCr ok at 1.15, BUN 28 LFTs / TGs: WNL , trig 51 ( 1/24)  Prealbumin / albumin: Pre-albumin  low at 18.3 (1/24), albumin 2.7 Intake / Output; MIVF: UOP not measured, LR at 10 ml/hr GI Imaging: CT shows evidence of prior cholecystectomy and hysterectomy. Surgeries / Procedures:   Central access: CVC double lumen TPN start date: Chronic TPN  Nutritional Goals (per RD recommendation on 1/21): Kcal:  1900-2100 kcal Protein:  90-105 grams Fluid:  >/= 2.5 L/day  Chronic PTA TPN is from [facility].  Infuse 1800 mL over 12 hrs. Provides 85g of amino acids, 230g of dextrose, and SMOFlipid 40g only 3x/week  Current Nutrition:  Heart diet (unsure of absorption)  Plan:   At 1800: Infuse 1544m over 18 hrs: 44 mL/hr x 1 hr, then 88 mL/hr x 16 hrs, then 44 mL/hr x 1 hr.  TTSS TPN will provide 87 g of protein, 240 g of dextrose, and 0 g of lipids for a total of 1164 kcals  MWF TPN will provide 87 g of protein, 240 g of dextrose, and 50 g of lipids for a total of 1,660 kcals  Electrolytes in TPN: 1033m/L of Na, 6055mL of K, 0mE55m of Ca, 8 mEq/L of Mg, and 15mm30m of Phos. Cl:Ac ratio 1:2  Add standard MVI and trace elements to TPN  Check CBGs 3x per day around cyclic TPN  LR to 10ml/50YD/XAght to maintain ~1.8 L of fluid per day  BMP, magnesium, phosphorus with AM labs   Monitor  TPN labs on Mon/Thurs    ChrisNapoleon Form5/2022 7:12 AM

## 2020-12-21 DIAGNOSIS — T827XXA Infection and inflammatory reaction due to other cardiac and vascular devices, implants and grafts, initial encounter: Secondary | ICD-10-CM | POA: Diagnosis not present

## 2020-12-21 DIAGNOSIS — G40909 Epilepsy, unspecified, not intractable, without status epilepticus: Secondary | ICD-10-CM | POA: Diagnosis not present

## 2020-12-21 DIAGNOSIS — R509 Fever, unspecified: Secondary | ICD-10-CM | POA: Diagnosis not present

## 2020-12-21 DIAGNOSIS — M8700 Idiopathic aseptic necrosis of unspecified bone: Secondary | ICD-10-CM | POA: Diagnosis not present

## 2020-12-21 DIAGNOSIS — R7881 Bacteremia: Secondary | ICD-10-CM | POA: Diagnosis not present

## 2020-12-21 DIAGNOSIS — N189 Chronic kidney disease, unspecified: Secondary | ICD-10-CM

## 2020-12-21 DIAGNOSIS — R569 Unspecified convulsions: Secondary | ICD-10-CM | POA: Diagnosis not present

## 2020-12-21 DIAGNOSIS — T80211A Bloodstream infection due to central venous catheter, initial encounter: Secondary | ICD-10-CM | POA: Diagnosis not present

## 2020-12-21 DIAGNOSIS — K529 Noninfective gastroenteritis and colitis, unspecified: Secondary | ICD-10-CM

## 2020-12-21 DIAGNOSIS — F419 Anxiety disorder, unspecified: Secondary | ICD-10-CM | POA: Diagnosis not present

## 2020-12-21 DIAGNOSIS — B957 Other staphylococcus as the cause of diseases classified elsewhere: Secondary | ICD-10-CM | POA: Diagnosis not present

## 2020-12-21 LAB — COMPREHENSIVE METABOLIC PANEL
ALT: 12 U/L (ref 0–44)
AST: 12 U/L — ABNORMAL LOW (ref 15–41)
Albumin: 2.7 g/dL — ABNORMAL LOW (ref 3.5–5.0)
Alkaline Phosphatase: 123 U/L (ref 38–126)
Anion gap: 9 (ref 5–15)
BUN: 28 mg/dL — ABNORMAL HIGH (ref 6–20)
CO2: 29 mmol/L (ref 22–32)
Calcium: 9.2 mg/dL (ref 8.9–10.3)
Chloride: 100 mmol/L (ref 98–111)
Creatinine, Ser: 1.05 mg/dL — ABNORMAL HIGH (ref 0.44–1.00)
GFR, Estimated: >60 mL/min (ref >60)
Glucose, Bld: 116 mg/dL — ABNORMAL HIGH (ref 70–99)
Potassium: 4.1 mmol/L (ref 3.5–5.1)
Sodium: 138 mmol/L (ref 135–145)
Total Bilirubin: 0.6 mg/dL (ref 0.3–1.2)
Total Protein: 6.1 g/dL — ABNORMAL LOW (ref 6.5–8.1)

## 2020-12-21 LAB — GLUCOSE, CAPILLARY
Glucose-Capillary: 106 mg/dL — ABNORMAL HIGH (ref 70–99)
Glucose-Capillary: 101 mg/dL — ABNORMAL HIGH (ref 70–99)

## 2020-12-21 LAB — PHOSPHORUS: Phosphorus: 3.9 mg/dL (ref 2.5–4.6)

## 2020-12-21 LAB — CULTURE, BLOOD (ROUTINE X 2): Culture: NO GROWTH

## 2020-12-21 LAB — FERRITIN: Ferritin: 44 ng/mL (ref 11–307)

## 2020-12-21 LAB — LACTATE DEHYDROGENASE: LDH: 87 U/L — ABNORMAL LOW (ref 98–192)

## 2020-12-21 LAB — D-DIMER, QUANTITATIVE: D-Dimer, Quant: 0.92 ug/mL-FEU — ABNORMAL HIGH (ref 0.00–0.50)

## 2020-12-21 LAB — C-REACTIVE PROTEIN: CRP: 1.6 mg/dL — ABNORMAL HIGH (ref <1.0)

## 2020-12-21 LAB — MAGNESIUM: Magnesium: 2.2 mg/dL (ref 1.7–2.4)

## 2020-12-21 MED ORDER — VANCOMYCIN 2.5 MG/ML + HEPARIN 2500 UNITS/ML ABX LOCK SOLN: 2.5000 mL | 0 refills | Status: AC

## 2020-12-21 MED ORDER — LEVETIRACETAM 500 MG PO TABS: 500.0000 mg | ORAL_TABLET | Freq: Two times a day (BID) | ORAL | 0 refills | Status: DC

## 2020-12-21 MED ORDER — DAPTOMYCIN IV (FOR PTA / DISCHARGE USE ONLY): 500.0000 mg | INTRAVENOUS | 0 refills | Status: AC

## 2020-12-21 MED ORDER — TRACE MINERALS CU-MN-SE-ZN 300-55-60-3000 MCG/ML IV SOLN
INTRAVENOUS | Status: DC
  Filled 2020-12-21: qty 580

## 2020-12-21 MED ORDER — HEPARIN SOD (PORK) LOCK FLUSH 100 UNIT/ML IV SOLN
250.0000 [IU] | INTRAVENOUS | Status: AC | PRN
  Administered 2020-12-21: 250 [IU]
  Filled 2020-12-21: qty 2.5

## 2020-12-21 NOTE — Progress Notes (Signed)
[facility] Infectious Diseases Follow Up Note  Patient Identification: Patient Name: Caitlin Peters MRN: [id number] [place] Date: 12/15/2020  1:26 PM Age: 60 y.o.Today's Date: 12/21/2020   Reason for Visit: Bacteremia   Principal Problem:   Seizure [facility]) Active Problems:   Chronic diarrhea   Short gut syndrome   Depression   Chronic pain syndrome   On total parenteral nutrition (TPN)   Anxiety   Bacteremia associated with intravascular line ([name])   Avascular necrosis ([name])   Choledocholithiasis (sludge) s/p ERCP 10/2019   Methadone dependence ([name])   CKD (chronic kidney disease) stage 3, GFR 30-59 ml/min ([name])   COVID-19 virus infection   Drug-seeking behavior   Bacteremia   [name]   Pressure injury of skin  Antibiotics:  Daptomycin Day 5                               Total days of antibiotics Day 6  Lines/Tubes: Rt femoral catheter placed on 08/05/20.   Interval Events: afebrile, no leukocytosis, hemodynamically stable. 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 CPK 25 -Started on Vancomycin lock from 1/24 -Duration would be 2 weeks from date of negative blood cx on 12/18/20. Blood cx NG in 2 days -End date 01/01/21 -Monitor CBC, BMP and CPK while on IV abx -She already has an appointment with Dr Lucianne Lei dam at [facility] office on 1/31 at 3:45 pm. She is aware about it -Isolation precautions per Infection  prevention  -Will sign off for now. Please call with questions  Rest of the management as per the primary team. Thank you for the consult. Please page with pertinent questions or concerns.  ______________________________________________________________________ Subjective patient seen and examined at the bedside. Doing well. No complaints. Eager to go home   Vitals BP (!) 145/71   Pulse 64   Temp 98.2 F (36.8 C) (Oral)   Resp 15   Ht 5' 8"  (1.727 m)   Wt 59.9 kg   SpO2 99%   BMI 20.08 kg/m   Physical Exam Constitutional: Not in acute distress and appears comfortable Comments:   Cardiovascular:  Rate and Rhythm: Normal rateand regular rhythm.  Heart sounds:   Pulmonary:  Effort: Pulmonary effort is normal.  Comments:   Abdominal:  Palpations: Abdomen is soft.  Tenderness: Non tender   Musculoskeletal:  General: No swellingor tenderness.   Skin: Comments: No obvious lesions or rashes   Neurological:  General: No focal deficitpresent.   Psychiatric:  Mood and Affect: Moodnormal.    Pertinent Microbiology Results for orders placed or performed during the hospital encounter of 12/15/20  Culture, blood (routine x 2)     Status: Abnormal   Collection Time: 12/15/20  1:06 AM   Specimen: BLOOD  Result Value Ref Range Status   Specimen Description   Final    BLOOD LEFT ARM Performed at [place] [address], [place], [place] [address]  Special Requests   Final    BOTTLES DRAWN AEROBIC AND ANAEROBIC Blood Culture adequate volume Performed at [place] [address] [place], [place] [address]    Culture  Setup Time   Final    IN BOTH AEROBIC AND ANAEROBIC BOTTLES GRAM POSITIVE COCCI CRITICAL VALUE NOTED.  VALUE IS CONSISTENT WITH PREVIOUSLY REPORTED AND CALLED VALUE.    Culture (A)  Final    STAPHYLOCOCCUS EPIDERMIDIS SUSCEPTIBILITIES PERFORMED ON PREVIOUS  CULTURE WITHIN THE LAST 5 DAYS. Performed at [facility], [place] [address], [place], [place] [address]    Report Status 12/19/2020 FINAL  Final  Urine culture     Status: None   Collection Time: 12/15/20  5:38 PM   Specimen: Urine, Clean Catch  Result Value Ref Range Status   Specimen Description   Final    URINE, CLEAN CATCH Performed at [facility], [place] [address] [place], [place] [address]    Special Requests   Final    NONE Performed at [facility], [place] [address] [place], [place] [address]    Culture   Final    NO GROWTH Performed at [facility], [place] [address], [place], [place] [address]    Report Status 12/17/2020 FINAL  Final  Culture, blood (routine x 2)     Status: Abnormal   Collection Time: 12/15/20  7:16 PM   Specimen: BLOOD  Result Value Ref Range Status   Specimen Description   Final    BLOOD PICC LINE Performed at [place] [address] [place], [place] [address]    Special Requests   Final    BOTTLES DRAWN AEROBIC AND ANAEROBIC Blood Culture adequate volume Performed at [place] [address] [place], [place] [address]    Culture  Setup Time   Final    IN BOTH AEROBIC AND ANAEROBIC BOTTLES GRAM POSITIVE COCCI CRITICAL RESULT CALLED TO, READ BACK BY AND VERIFIED WITH: [facility] 12/16/20 2330 JDW Performed at [facility], [place] [address], [place], [place] [address]    Culture STAPHYLOCOCCUS EPIDERMIDIS (A)  Final   Report Status 12/19/2020 FINAL  Final   Organism ID, Bacteria STAPHYLOCOCCUS EPIDERMIDIS  Final      Susceptibility   Staphylococcus epidermidis - MIC*    CIPROFLOXACIN >=8 RESISTANT Resistant     ERYTHROMYCIN >=8 RESISTANT Resistant     GENTAMICIN >=16 RESISTANT Resistant     OXACILLIN >=4 RESISTANT Resistant     TETRACYCLINE 2 SENSITIVE Sensitive     VANCOMYCIN 1 SENSITIVE Sensitive     TRIMETH/SULFA 80 RESISTANT  Resistant     CLINDAMYCIN >=8 RESISTANT Resistant     RIFAMPIN <=0.5 SENSITIVE Sensitive     Inducible Clindamycin NEGATIVE Sensitive     * STAPHYLOCOCCUS EPIDERMIDIS  Blood Culture ID Panel (Reflexed)     Status: Abnormal   Collection Time: 12/15/20  7:16 PM  Result Value Ref Range Status   Enterococcus faecalis NOT DETECTED NOT DETECTED Final   Enterococcus Faecium NOT DETECTED NOT DETECTED Final   Listeria monocytogenes NOT DETECTED NOT DETECTED Final   Staphylococcus species DETECTED (A) NOT DETECTED Final    Comment: CRITICAL RESULT CALLED TO, READ BACK BY AND VERIFIED WITH: L POINDEXTER PHARMD 12/16/20 2330 JDW    Staphylococcus aureus (BCID) NOT DETECTED NOT DETECTED Final   Staphylococcus epidermidis DETECTED (A) NOT DETECTED Final    Comment: Methicillin (oxacillin) resistant coagulase negative  staphylococcus. Possible blood culture contaminant (unless isolated from more than one blood culture draw or clinical case suggests pathogenicity). No antibiotic treatment is indicated for blood  culture contaminants. CRITICAL RESULT CALLED TO, READ BACK BY AND VERIFIED WITH: L POINDEXTER PHARMD 12/16/20 2230 JDW    Staphylococcus lugdunensis NOT DETECTED NOT DETECTED Final   Streptococcus species NOT DETECTED NOT DETECTED Final   Streptococcus agalactiae NOT DETECTED NOT DETECTED Final   Streptococcus pneumoniae NOT DETECTED NOT DETECTED Final   Streptococcus pyogenes NOT DETECTED NOT DETECTED Final   A.calcoaceticus-baumannii NOT DETECTED NOT DETECTED Final   Bacteroides fragilis NOT DETECTED NOT DETECTED Final   Enterobacterales NOT DETECTED NOT DETECTED Final   Enterobacter cloacae complex NOT DETECTED NOT DETECTED Final   Escherichia coli NOT DETECTED NOT DETECTED Final   Klebsiella aerogenes NOT DETECTED NOT DETECTED Final   Klebsiella oxytoca NOT DETECTED NOT DETECTED Final   Klebsiella pneumoniae NOT DETECTED NOT DETECTED Final   Proteus species NOT DETECTED NOT DETECTED Final    Salmonella species NOT DETECTED NOT DETECTED Final   Serratia marcescens NOT DETECTED NOT DETECTED Final   Haemophilus influenzae NOT DETECTED NOT DETECTED Final   Neisseria meningitidis NOT DETECTED NOT DETECTED Final   Pseudomonas aeruginosa NOT DETECTED NOT DETECTED Final   Stenotrophomonas maltophilia NOT DETECTED NOT DETECTED Final   Candida albicans NOT DETECTED NOT DETECTED Final   Candida auris NOT DETECTED NOT DETECTED Final   Candida glabrata NOT DETECTED NOT DETECTED Final   Candida krusei NOT DETECTED NOT DETECTED Final   Candida parapsilosis NOT DETECTED NOT DETECTED Final   Candida tropicalis NOT DETECTED NOT DETECTED Final   Cryptococcus neoformans/gattii NOT DETECTED NOT DETECTED Final   Methicillin resistance mecA/C DETECTED (A) NOT DETECTED Final    Comment: CRITICAL RESULT CALLED TO, READ BACK BY AND VERIFIED WITH: L POINDEXTER PHARMD 12/16/20 2330 JDW Performed at [facility], [address], [place], [place] [address]   Culture, blood (routine x 2)     Status: Abnormal   Collection Time: 12/16/20 12:15 PM   Specimen: BLOOD  Result Value Ref Range Status   Specimen Description   Final    BLOOD FROM CVC Performed at [place] [address], [place], [place] [address]    Special Requests   Final    BOTTLES DRAWN AEROBIC AND ANAEROBIC Blood Culture adequate volume Performed at [place] [address] [place], [place] [address]    Culture  Setup Time   Final    IN BOTH AEROBIC AND ANAEROBIC BOTTLES GRAM POSITIVE COCCI CRITICAL VALUE NOTED.  VALUE IS CONSISTENT WITH PREVIOUSLY REPORTED AND CALLED VALUE.    Culture (A)  Final    STAPHYLOCOCCUS EPIDERMIDIS SUSCEPTIBILITIES PERFORMED ON PREVIOUS CULTURE WITHIN THE LAST 5 DAYS. Performed at [facility], [place] [address], [place], [place] [address]    Report Status 12/19/2020 FINAL  Final  Culture, blood (routine x 2)     Status: None (Preliminary result)    Collection Time: 12/16/20  1:36 PM   Specimen: BLOOD  Result Value Ref Range Status   Specimen Description   Final    BLOOD RIGHT ANTECUBITAL Performed at [facility] [address] [place], [place] [address]    Special Requests   Final    BOTTLES DRAWN AEROBIC ONLY Blood Culture results may not be optimal due to an inadequate volume of blood received in culture bottles Performed at [place]  [address], [place], [place] [address]    Culture   Final    NO GROWTH 4 DAYS Performed at [facility], [place] [address], [place], [place] [address]    Report Status PENDING  Incomplete  Culture, blood (Routine X 2) w Reflex to ID Panel     Status: None (Preliminary result)   Collection Time: 12/18/20  7:38 PM   Specimen: BLOOD RIGHT FOREARM  Result Value Ref Range Status   Specimen Description   Final    BLOOD RIGHT FOREARM Performed at [facility] [address] [place], [place] [address]    Special Requests   Final    BOTTLES DRAWN AEROBIC ONLY Blood Culture results may not be optimal due to an excessive volume of blood received in culture bottles Performed at [facility] [address], [place], [place] [address]    Culture   Final    NO GROWTH 2 DAYS Performed at [place] [address], [place], [place] [address]    Report Status PENDING  Incomplete  Culture, blood (Routine X 2) w Reflex to ID Panel     Status: None (Preliminary result)   Collection Time: 12/18/20  7:38 PM   Specimen: BLOOD RIGHT HAND  Result Value Ref Range Status   Specimen Description   Final    BLOOD RIGHT HAND Performed at [facility] [address] [place], [place] [address]    Special Requests   Final    BOTTLES DRAWN AEROBIC ONLY Blood Culture results may not be optimal due to an excessive volume of blood received in culture bottles Performed at [facility] [address], [place], [place] [address]    Culture   Final    NO GROWTH 2 DAYS Performed at [facility] [address] [place],  [address]    Report Status PENDING  Incomplete    Pertinent Lab. CBC Latest Ref Rng & Units 12/20/2020 12/19/2020 12/18/2020  WBC 4.0 - 10.5 K/uL 3.6(L) 3.7(L) 3.9(L)  Hemoglobin 12.0 - 15.0 g/dL 7.7(L) 8.1(L) 8.0(L)  Hematocrit 36.0 - 46.0 % 22.9(L) 24.7(L) 24.2(L)  Platelets 150 - 400 K/uL 157 172 159   CMP Latest Ref Rng & Units 12/21/2020 12/20/2020 12/19/2020  Glucose 70 - 99 mg/dL 116(H) 106(H) 114(H)  BUN 6 - 20 mg/dL 28(H) 28(H) 24(H)  Creatinine 0.44 - 1.00 mg/dL 1.05(H) 1.15(H) 1.12(H)  Sodium 135 - 145 mmol/L 138 138 138  Potassium 3.5 - 5.1 mmol/L 4.1 3.8 3.7  Chloride 98 - 111 mmol/L 100 103 104  CO2 22 - 32 mmol/L 29 25 25   Calcium 8.9 - 10.3 mg/dL 9.2 9.0 9.0  Total Protein 6.5 - 8.1 g/dL 6.1(L) 6.1(L) 6.0(L)  Total Bilirubin 0.3 - 1.2 mg/dL 0.6 0.4 0.4  Alkaline Phos 38 - 126 U/L 123 120 117  AST 15 - 41 U/L 12(L) 11(L) 11(L)  ALT 0 - 44 U/L 12 12 13      Pertinent Imaging today Plain films and CT images have been personally visualized and interpreted; radiology reports have been reviewed. Decision making incorporated into the Impression / Recommendations.  I have spent approx 30 minutes for this patient encounter including review of prior medical records with greater than 50% of time being face to face and coordination of their care.  Electronically signed by:   Rosiland Oz, MD Infectious Disease Physician [facility] for Infectious Disease Pager: [phone]

## 2020-12-21 NOTE — Progress Notes (Signed)
PHARMACY - TOTAL PARENTERAL NUTRITION CONSULT NOTE   Indication: Short bowel syndrome  Patient Measurements: Height: 5' 8"  (172.7 cm) Weight: 59.9 kg (132 lb 0.9 oz) IBW/kg (Calculated) : 63.9 TPN AdjBW (KG): 61.2 Body mass index is 20.08 kg/m.  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 Recent admit and ED visits with Covid PNA. Now presenting with possible seizure. Plan is to admit for observation. Pharmacy to resume TPN.  Glucose / Insulin: CBGs < 150, not on SSI currently  Electrolytes: Electrolytes WNL, stable. CorrCa is 10.2, WNL  Renal: CKD. SCr ok at 1.05, BUN 28 LFTs / TGs: WNL , trig 51 ( 1/24)  Prealbumin / albumin: Pre-albumin  low at 18.3 (1/24), albumin 2.7 Intake / Output; MIVF: UOP not measured, LR at 10 ml/hr GI Imaging: CT shows evidence of prior cholecystectomy and hysterectomy. Surgeries / Procedures:   Central access: CVC double lumen TPN start date: Chronic TPN  Nutritional Goals (per RD recommendation on 1/21): Kcal:  1900-2100 kcal Protein:  90-105 grams Fluid:  >/= 2.5 L/day  Chronic PTA TPN is from [place].  Infuse 1800 mL over 12 hrs. Provides 85g of amino acids, 230g of dextrose, and SMOFlipid 40g only 3x/week  Current Nutrition:  Heart diet (unsure of absorption)  Plan:   At 1800: Infuse 1529m over 18 hrs: 44 mL/hr x 1 hr, then 88 mL/hr x 16 hrs, then 44 mL/hr x 1 hr.  TTSS TPN will provide 87 g of protein, 240 g of dextrose, and 0 g of lipids for a total of 1164 kcals  MWF TPN will provide 87 g of protein, 240 g of dextrose, and 50 g of lipids for a total of 1,660 kcals  Electrolytes in TPN: 1038m/L of Na, 6023mL of K, 0mE68m of Ca, 8 mEq/L of Mg, and 15mm57m of Phos. Cl:Ac ratio 1:2  Add standard MVI and trace elements to TPN  Check CBGs 3x per day around cyclic TPN  LR to 10ml/41OI/NOght to maintain ~1.8 L of fluid per day  Monitor TPN labs on Mon/Thurs      NikolRoyetta AsalarmD, BCPS 12/21/2020 11:53 AM

## 2020-12-21 NOTE — Discharge Summary (Signed)
Physician Discharge Summary  Caitlin Peters SWN:[id number] DOB: 08-17-1961 DOA: 12/15/2020  PCP: Caitlin Peters, Caitlin Halsted, MD  Admit date: 12/15/2020 Discharge date: 12/21/2020  Admitted From: Home  Disposition:  Home   Recommendations for Outpatient Follow-up and new medication changes:  1. Follow up with Dr. Jerilee Hoh in 10 days.  2. Follow up with ID as scheduled. 3. Continue antibiotic therapy until 01/01/21.  4. Continue lock therapy with Vancomycin   Home Health: yes   Equipment/Devices: na    Discharge Condition: stable  CODE STATUS: full  Diet recommendation: heart healthy   Brief/Interim Summary: Caitlin Peters was admitted to the hospital with working diagnosis of uncontrolled seizures, in the setting of incidental COVID-19 infection and staph epidermidis methilcillin resistant bacteremia.  60 year old female past medical history of Crohn's disease, short gut syndrome, chronic kidney disease stage III, hypertension, chronic pain syndrome, chronic TPN 2003.  Patient was brought to the hospital after a witnessed seizure.  Patient had recent hospitalization for COVID-19 (12/08/20).  On her initial physical examination she was febrile 100.5 F, blood pressure 135/66, heart rate 89, respirate 15, oxygen saturation 97%, her lungs were clear to auscultation bilaterally, heart S1-S2, present rhythmic, soft abdomen, no extremity edema, neurologically patient was intact, awake and alert x3.  Sodium 136, potassium 4.4, chloride 109, bicarb 18, glucose 86, BUN 46, creatinine 1.4, AST 26, ALT 24.  White count 7.6, hemoglobin 9.3, hematocrit 28.8, platelets 191.  SARS COVID-19 positive. Chest radiograph no infiltrates.  Her blood cultures were positive for Staph epidermidis.  She was placed on antibiotic therapy, initially vancomycin and then transitioned to daptomycin. Patient was placed on Keppra per neurology for seizure activity.  1.  Methicillin-resistant Staph epidermidis  bacteremia.  In the setting of right femoral catheter chronic kidney disease/limited vascular access.  Follow-up blood cultures were no growth, patient initially treated with vancomycin and daptomycin.  Continue outpatient antibiotic therapy with intravenous daptomycin and lock therapy with vancomycin. Right femoral venous catheter remain in place.  Follow-up CBC, BMP, CPK while on IV antibiotics.  Follow-up with Dr. Drucilla Schmidt 1/31, 3:45 PM.  Patient had systemic complete response syndrome but no frank sepsis.  Sepsis ruled out.  2.  Seizures.  Further work-up with electroencephalography showed diffuse encephalopathy, no active seizures. Brain MRI no acute changes. Neurology recommended Keppra for seizure prophylaxis.  3.  Chronic diastolic heart failure.  No signs of acute decompensation.  4.  Crohn's disease, short gut syndrome, chronic pain syndrome/ depression.  Patient will follow-up as an outpatient, continue nutrition per TPN.  5.  Hypokalemia/hypomagnesemia.  Chronic kidney disease stage IIIa.  Her kidney function remained stable, electrolytes were corrected.  6.  SARS COVID-19 viral infection.,  No clinical signs of viral pneumonia, patient received 3 doses of remdesivir due to high risk for worsening cardiac disease.   Discharge Diagnoses:  Principal Problem:   Seizure [facility]) Active Problems:   Chronic diarrhea   Short gut syndrome   Depression   Chronic pain syndrome   On total parenteral nutrition (TPN)   Anxiety   Bacteremia associated with intravascular line ([name])   Avascular necrosis ([name])   Choledocholithiasis (sludge) s/p ERCP 10/2019   Methadone dependence ([name])   CKD (chronic kidney disease) stage 3, GFR 30-59 ml/min ([name])   COVID-19 virus infection   Drug-seeking behavior   Bacteremia   Seizures [facility])    Discharge Instructions  Discharge Instructions    [facility] pharmacist to adjust dose for Vancomycin, Aminoglycosides and other  anti-infective therapies as requested by physician.   Complete by: As directed    [facility] to provide Cath Flo 62m   Complete by: As directed    Administer for PICC line occlusion and as ordered by physician for other access device issues.   Anaphylaxis Kit: Provided to treat any anaphylactic reaction to the medication being provided to the patient if First Dose or when requested by physician   Complete by: As directed    Epinephrine 159mml vial / amp: Administer 0.9m61m0.9ml45mubcutaneously once for moderate to severe anaphylaxis, nurse to call physician and pharmacy when reaction occurs and call 911 if needed for immediate care   Diphenhydramine 50mg57mIV vial: Administer 25-50mg 17mM PRN for first dose reaction, rash, itching, mild reaction, nurse to call physician and pharmacy when reaction occurs   Sodium Chloride 0.9% NS 500ml I99mdminister if needed for hypovolemic blood pressure drop or as ordered by physician after call to physician with anaphylactic reaction   Change dressing on IV access line weekly and PRN   Complete by: As directed    Diet - low sodium heart healthy   Complete by: As directed    Discharge instructions   Complete by: As directed    Follow up with primary care in 10 days. Continue to self quarantine for 5 more days, use a mask in public and maintain physical distancing.   Flush IV access with Sodium Chloride 0.9% and Heparin 10 units/ml or 100 units/ml   Complete by: As directed    Home infusion instructions - [facility]   Complete by: As directed    Instructions: Flush IV access with Sodium Chloride 0.9% and Heparin 10units/ml or 100units/ml   Change dressing on IV access line: Weekly and PRN   Instructions Cath Flo 2mg: Ad70mister for PICC Line occlusion and as ordered by physician for other access device   [facility] pharmacist to adjust dose for: Vancomycin, Aminoglycosides and other anti-infective therapies as requested  by physician   Increase activity slowly   Complete by: As directed    Method of administration may be changed at the discretion of home infusion pharmacist based upon assessment of the patient and/or caregiver's ability to self-administer the medication ordered   Complete by: As directed    No wound care   Complete by: As directed      Allergies as of 12/21/2020      Reactions   Meperidine Hives   Other reaction(s): GI Upset Due to Chrones   Hyoscyamine Hives, Swelling   Legs swelling Disorientation   Cefepime Other (See Comments)   Neurotoxicity occurring in setting of AKI. Ceftriaxone tolerated during same admit   Gabapentin Other (See Comments)   unknown   Lyrica [pregabalin] Other (See Comments)   unknown   Topamax [topiramate] Other (See Comments)   unknown   Zosyn [piperacillin Sod-tazobactam So]    Patient reports it makes her vomit, her neck stiff, and her "heart feel funny"   Fentanyl Rash   Pt is allergic to fentanyl patch related to the glue (gives her a rash) Pt states she is NOT allergic to fentanyl IV medicine   Morphine And Related Rash      Medication List    STOP taking these medications   buPROPion 100 MG 12 hr tablet Commonly known as: WELLBUTRIN SR     TAKE these medications   acetaminophen 325 MG tablet Commonly known as: TYLENOL Take 650 mg by mouth every  6 (six) hours as needed for mild pain.   amLODipine 10 MG tablet Commonly known as: NORVASC Take 1 tablet (10 mg total) by mouth daily.   budesonide 3 MG 24 hr capsule Commonly known as: ENTOCORT EC TAKE 3 CAPSULES BY MOUTH ONCE DAILY   Calcium 200 MG Tabs Take 200 mg by mouth daily.   cholecalciferol 25 MCG (1000 UNIT) tablet Commonly known as: VITAMIN D3 Take 1,000 Units by mouth daily.   cycloSPORINE 0.05 % ophthalmic emulsion Commonly known as: RESTASIS Place 1 drop into both eyes 2 (two) times daily.   daptomycin  IVPB Commonly known as: CUBICIN Inject 500 mg into the vein  daily for 11 days. Indication:  MRSE bacteremia w/ femoral cath  First Dose: Yes Last Day of Therapy:  01/01/21 Labs - Once weekly:  CBC/D, BMP, and CPK Labs - Every other week:  ESR and CRP Method of administration: IV Push Method of administration may be changed at the discretion of home infusion pharmacist based upon assessment of the patient and/or caregiver's ability to self-administer the medication ordered.   Dexilant 60 MG capsule Generic drug: dexlansoprazole Take 1 capsule (60 mg total) by mouth daily.   Dextran 70-Hypromellose 0.1-0.3 % Soln Place 1 drop into both eyes 4 (four) times daily.   diphenoxylate-atropine 2.5-0.025 MG tablet Commonly known as: LOMOTIL TAKE 1 TABLET BY MOUTH 4 TIMES DAILY AS NEEDED FOR DIARRHEA OR  LOOSE  STOOLS What changed: See the new instructions.   DULoxetine 30 MG capsule Commonly known as: CYMBALTA Take 3 capsules (90 mg total) by mouth daily.   estradiol 2 MG tablet Commonly known as: ESTRACE Take 1 tablet (2 mg total) by mouth daily.   hydrALAZINE 50 MG tablet Commonly known as: APRESOLINE Take 1.5 tablets (75 mg total) by mouth 3 (three) times daily.   HYDROmorphone 4 MG tablet Commonly known as: DILAUDID Take 4 mg by mouth 4 (four) times daily as needed for moderate pain.   levETIRAcetam 500 MG tablet Commonly known as: KEPPRA Take 1 tablet (500 mg total) by mouth 2 (two) times daily.   lipase/protease/amylase 36000 UNITS Cpep capsule Commonly known as: Creon Take 1 capsule (36,000 Units total) by mouth 3 (three) times daily as needed (with meals for digestion).   loperamide 2 MG capsule Commonly known as: IMODIUM Take 2 mg by mouth as needed for diarrhea or loose stools.   methadone 5 MG tablet Commonly known as: DOLOPHINE Take 1 tablet (5 mg total) by mouth 5 (five) times daily.   metoprolol tartrate 100 MG tablet Commonly known as: LOPRESSOR Take 1 tablet (100 mg total) by mouth 2 (two) times daily.    MULTIVITAMIN ADULT PO Take 1 tablet by mouth daily.   Narcan 4 MG/0.1ML Liqd nasal spray kit Generic drug: naloxone Place 1 spray into the nose once.   PRESCRIPTION MEDICATION Inject 1 each into the vein daily. Home TPN . [facility] in [facility] . 1 bag for 12 hours. [phone]   PROBIOTIC-10 PO Take 1 capsule by mouth daily.   promethazine 25 MG tablet Commonly known as: PHENERGAN Take 1 tablet (25 mg total) by mouth every 6 (six) hours as needed for nausea or vomiting.   sodium chloride 0.9 % infusion Inject into the vein.   sucralfate 1 GM/10ML suspension Commonly known as: Carafate Take 10 mLs (1 g total) by mouth 4 (four) times daily -  with meals and at bedtime.   TRALEMENT IV Inject 1 mL  into the vein. Used in hydration bag 4 times weekly   vancomycin 2.5 mg/ml + heparin 2500 units/ml 2.5 mLs by Intracatheter route daily for 11 days. For  Lumen #1. Lock solution should dwell for at least 2 hours   vancomycin 2.5 mg/ml + heparin 2500 units/ml 2.5 mLs by Intracatheter route daily for 11 days. For lumen #2. Lock solution should dwell for at least 2 hours   vitamin B-12 100 MCG tablet Commonly known as: CYANOCOBALAMIN Take 1,000 mcg by mouth daily.            Discharge Care Instructions  (From admission, onward)         Start     Ordered   12/21/20 0000  Change dressing on IV access line weekly and PRN  (Home infusion instructions - [facility] )        12/21/20 1026          Follow-up Information    Caitlin Peters, Caitlin Halsted, MD Follow up.   Specialty: Internal Medicine Why: as scheduled Contact information: [place] [address] [phone]        Tommy Medal, Lavell Islam, MD Follow up.   Specialty: Infectious Diseases Why: as scheduled Contact information: [address] [address] [phone]              Allergies  Allergen Reactions  . Meperidine Hives     Other reaction(s): GI Upset Due to Chrones   . Hyoscyamine Hives and Swelling    Legs swelling   Disorientation  . Cefepime Other (See Comments)    Neurotoxicity occurring in setting of AKI. Ceftriaxone tolerated during same admit  . Gabapentin Other (See Comments)    unknown  . Lyrica [Pregabalin] Other (See Comments)    unknown  . Topamax [Topiramate] Other (See Comments)    unknown  . Zosyn [Piperacillin Sod-Tazobactam So]     Patient reports it makes her vomit, her neck stiff, and her "heart feel funny"  . Fentanyl Rash    Pt is allergic to fentanyl patch related to the glue (gives her a rash) Pt states she is NOT allergic to fentanyl IV medicine  . Morphine And Related Rash    Consultations:  ID    Procedures/Studies: DG Chest 2 View  Result Date: 12/18/2020 CLINICAL DATA:  Left chest wall pain at site of previous trauma EXAM: CHEST - 2 VIEW COMPARISON:  12/16/2020 FINDINGS: Frontal and lateral views of the chest demonstrate an unremarkable cardiac silhouette. No airspace disease, effusion, or pneumothorax. No acute displaced fracture. IMPRESSION: 1. No acute intrathoracic process. Electronically Signed   By: Randa Ngo M.D.   On: 12/18/2020 18:51   CT Head Wo Contrast  Result Date: 12/15/2020 CLINICAL DATA:  Seizure, acute, history of trauma. Additional provided: COVID positive 10 days ago. EXAM: CT HEAD WITHOUT CONTRAST TECHNIQUE: Contiguous axial images were obtained from the base of the skull through the vertex without intravenous contrast. COMPARISON:  Brain MRI 08/10/2020.  Head CT 08/09/2020. FINDINGS: Brain: Cerebral volume is normal. Moderate for age patchy hypodensity within the cerebral white matter, nonspecific. There is no acute intracranial hemorrhage. No demarcated cortical infarct. No extra-axial fluid collection. No evidence of intracranial mass. No midline shift. Vascular: No hyperdense vessel.  Atherosclerotic calcifications. Skull: Normal. Negative for  fracture or focal lesion. Sinuses/Orbits: Visualized orbits show no acute finding. Mild mucosal thickening at the imaged levels, most notably ethmoidal. IMPRESSION: No evidence of acute intracranial abnormality.  Moderate for age nonspecific cerebral white matter disease. Mild paranasal sinus mucosal thickening. Electronically Signed   By: Kellie Simmering DO   On: 12/15/2020 18:03   MR BRAIN WO CONTRAST  Result Date: 12/15/2020 CLINICAL DATA:  Initial evaluation for acute seizure, COVID positive. EXAM: MRI HEAD WITHOUT CONTRAST TECHNIQUE: Multiplanar, multiecho pulse sequences of the brain and surrounding structures were obtained without intravenous contrast. COMPARISON:  Prior CT from earlier the same day as well as previous brain MRI from 08/10/2020. FINDINGS: Brain: Age-appropriate cerebral volume loss. Again seen is patchy T2/FLAIR hyperintensity involving the periventricular, deep, and subcortical white matter both cerebral hemispheres, nonspecific, but most like related chronic microvascular ischemic disease. Appearance is stable from previous. No abnormal foci of restricted diffusion to suggest acute or subacute ischemia or changes related to seizure. Gray-white matter differentiation maintained. No encephalomalacia to suggest chronic cortical infarction. No foci of susceptibility artifact to suggest acute or chronic intracranial hemorrhage. No mass lesion, midline shift or mass effect. No hydrocephalus or extra-axial fluid collection. Pituitary gland suprasellar region normal. Midline structures intact. No definite intrinsic temporal lobe abnormality. Vascular: Major intracranial vascular flow voids are maintained. Skull and upper cervical spine: Craniocervical junction within normal limits. Bone marrow signal intensity normal. No scalp soft tissue abnormality. Sinuses/Orbits: Globes and orbital soft tissues within normal limits. Scattered mucosal thickening noted within the ethmoidal air cells and maxillary  sinuses. Paranasal sinuses are otherwise clear. Trace fluid signal noted within the mastoid air cells bilaterally, of doubtful significance. Inner ear structures grossly normal. Other: None. IMPRESSION: 1. No acute intracranial abnormality. 2. Mild to moderate cerebral white matter changes, nonspecific, but most likely related to chronic microvascular ischemic disease, stable. Electronically Signed   By: Jeannine Boga M.D.   On: 12/15/2020 22:45   CT Abdomen Pelvis W Contrast  Result Date: 12/15/2020 CLINICAL DATA:  Abdominal pain. EXAM: CT ABDOMEN AND PELVIS WITH CONTRAST TECHNIQUE: Multidetector CT imaging of the abdomen and pelvis was performed using the standard protocol following bolus administration of intravenous contrast. CONTRAST:  77m OMNIPAQUE IOHEXOL 300 MG/ML  SOLN COMPARISON:  November 11, 2020 FINDINGS: Lower chest: No acute abnormality. Hepatobiliary: No focal liver abnormality is seen. A stable amount of pneumobilia is noted within the right lobe of the liver. Status post cholecystectomy. No biliary dilatation. Pancreas: There is mild pancreatic duct dilatation. The pancreas is otherwise unremarkable. Spleen: Normal in size without focal abnormality. Adrenals/Urinary Tract: Adrenal glands are unremarkable. Kidneys are normal, without renal calculi, focal lesion, or hydronephrosis. Bladder is unremarkable. Stomach/Bowel: Stomach is within normal limits. The appendix is not identified. Surgically anastomosed bowel is seen throughout the abdomen. There is no evidence of bowel dilatation. Vascular/Lymphatic: A right common femoral central venous catheter is again seen which is unchanged in position. No additional significant vascular findings are present. No enlarged abdominal or pelvic lymph nodes. Reproductive: Status post hysterectomy. No adnexal masses. Other: No abdominal wall hernia or abnormality. No abdominopelvic ascites. Musculoskeletal: No acute or significant osseous findings.  IMPRESSION: 1. Evidence of prior cholecystectomy with stable pneumobilia. 2. Status post cholecystectomy and hysterectomy. 3. Stable right common femoral central venous catheter positioning. Electronically Signed   By: TVirgina NorfolkM.D.   On: 12/15/2020 18:25   DG CHEST PORT 1 VIEW  Result Date: 12/16/2020 CLINICAL DATA:  Possible seizure activity, COVID positive 10 days ago EXAM: PORTABLE CHEST 1 VIEW COMPARISON:  12/15/2020 FINDINGS: Stable postop clips over the right chest. Stable mild cardiomegaly without CHF or edema. No focal pneumonia,  collapse or consolidation. Negative for effusion or pneumothorax. Trachea midline. Skin fold over the right mid chest noted. IMPRESSION: Stable exam.  No active chest disease. Electronically Signed   By: Jerilynn Mages.  Shick M.D.   On: 12/16/2020 11:21   DG Chest Port 1 View  Result Date: 12/15/2020 CLINICAL DATA:  Shortness of breath, COVID positive 10 days ago. EXAM: PORTABLE CHEST 1 VIEW COMPARISON:  Chest x-ray 12/05/2020 FINDINGS: The heart size and mediastinal contours are stable. No focal consolidation. No pulmonary edema. No pleural effusion. No pneumothorax. No acute osseous abnormality. IMPRESSION: No active disease. Electronically Signed   By: Iven Finn M.D.   On: 12/15/2020 15:28   DG Chest Port 1 View  Result Date: 12/05/2020 CLINICAL DATA:  Fever EXAM: PORTABLE CHEST 1 VIEW COMPARISON:  11/07/2020 FINDINGS: Mild cardiomegaly. No confluent opacities or effusions. No acute bony abnormality. IMPRESSION: No active disease. Electronically Signed   By: Rolm Baptise M.D.   On: 12/05/2020 23:12   EEG adult  Result Date: 12/16/2020 Lora Havens, MD     12/16/2020  4:07 PM Patient Name: Caitlin Peters MRN: [id number] Epilepsy Attending: Lora Havens Referring Physician/Provider: Dr. Dia Crawford Date: 12/16/2020 Duration: 26.31 mins Patient history: 60 year old female who presented with seizures.  EEG to evaluate for seizures. Level of alertness:  Awake, drowsy AEDs during EEG study: None Technical aspects: This EEG study was done with scalp electrodes positioned according to the 10-20 International system of electrode placement. Electrical activity was acquired at a sampling rate of 500Hz  and reviewed with a high frequency filter of 70Hz  and a low frequency filter of 1Hz . EEG data were recorded continuously and digitally stored. Description: The posterior dominant rhythm consists of 8.5 Hz activity of moderate voltage (25-35 uV) seen predominantly in posterior head regions, symmetric and reactive to eye opening and eye closing. Drowsiness was characterized by attenuation of the posterior background rhythm. EEG showed intermittent generalized 2-3 hz delta slowing. Physioloic photic driving was not seen during photic stimulation.  Hyperventilation was not performed.   ABNORMALITY -Intermittent slow, generalized IMPRESSION: This study is suggestive of mild diffuse encephalopathy, nonspecific etiology. No seizures or epileptiform discharges were seen throughout the recording. Lora Havens   ECHOCARDIOGRAM COMPLETE  Result Date: 12/17/2020    ECHOCARDIOGRAM REPORT   Patient Name:   Caitlin Peters Date of Exam: 12/17/2020 Medical Rec #:  [id number]        Height:       68.0 in Accession #:    [id number]       Weight:       134.9 lb Date of Birth:  01-20-1961        BSA:          1.729 m Patient Age:    60 years         BP:           162/85 mmHg Patient Gender: F                HR:           64 bpm. Exam Location:  Inpatient Procedure: 2D Echo, Cardiac Doppler and Color Doppler Indications:    Bacteremia  History:        Patient has prior history of Echocardiogram examinations, most                 recent 09/03/2020. Arrythmias:PAC; Risk Factors:Former Smoker and  Hypertension. COVID-19 virus infection, Methadone dependence per                 chart.  Sonographer:    Alvino Chapel RCS Referring Phys: [id number] [place]  1. Left  ventricular ejection fraction, by estimation, is 60 to 65%. The left ventricle has normal function. The left ventricle has no regional wall motion abnormalities. There is mild concentric left ventricular hypertrophy and severe basal septal hypertrophy.  2. Right ventricular systolic function is normal. The right ventricular size is normal. There is normal pulmonary artery systolic pressure. The estimated right ventricular systolic pressure is 00.3 mmHg.  3. The mitral valve is normal in structure. Mild mitral valve regurgitation. No evidence of mitral stenosis.  4. The aortic valve is tricuspid. Aortic valve regurgitation is not visualized. Mild aortic valve sclerosis is present, with no evidence of aortic valve stenosis.  5. The inferior vena cava is normal in size with greater than 50% respiratory variability, suggesting right atrial pressure of 3 mmHg.  6. Left atrial size was moderately dilated. FINDINGS  Left Ventricle: Left ventricular ejection fraction, by estimation, is 60 to 65%. The left ventricle has normal function. The left ventricle has no regional wall motion abnormalities. The left ventricular internal cavity size was normal in size. There is  mild concentric left ventricular hypertrophy. Left ventricular diastolic parameters are consistent with Grade II diastolic dysfunction (pseudonormalization). Normal left ventricular filling pressure. Right Ventricle: The right ventricular size is normal. No increase in right ventricular wall thickness. Right ventricular systolic function is normal. There is normal pulmonary artery systolic pressure. The tricuspid regurgitant velocity is 2.41 m/s, and  with an assumed right atrial pressure of 3 mmHg, the estimated right ventricular systolic pressure is 49.1 mmHg. Left Atrium: Left atrial size was moderately dilated. Right Atrium: Right atrial size was normal in size. Pericardium: There is no evidence of pericardial effusion. Mitral Valve: The mitral valve is  normal in structure. Mild mitral valve regurgitation. No evidence of mitral valve stenosis. Tricuspid Valve: The tricuspid valve is normal in structure. Tricuspid valve regurgitation is mild . No evidence of tricuspid stenosis. Aortic Valve: The aortic valve is tricuspid. Aortic valve regurgitation is not visualized. Mild aortic valve sclerosis is present, with no evidence of aortic valve stenosis. Pulmonic Valve: The pulmonic valve was normal in structure. Pulmonic valve regurgitation is not visualized. No evidence of pulmonic stenosis. Aorta: The aortic root is normal in size and structure. Venous: The inferior vena cava is normal in size with greater than 50% respiratory variability, suggesting right atrial pressure of 3 mmHg. IAS/Shunts: No atrial level shunt detected by color flow Doppler.  LEFT VENTRICLE PLAX 2D LVIDd:         4.80 cm  Diastology LVIDs:         2.30 cm  LV e' medial:    6.85 cm/s LV PW:         1.20 cm  LV E/e' medial:  12.0 LV IVS:        1.50 cm  LV e' lateral:   8.59 cm/s LVOT diam:     2.20 cm  LV E/e' lateral: 9.5 LV SV:         125 LV SV Index:   73 LVOT Area:     3.80 cm  RIGHT VENTRICLE RV S prime:     10.60 cm/s TAPSE (M-mode): 1.8 cm LEFT ATRIUM             Index  RIGHT ATRIUM           Index LA diam:        4.90 cm 2.83 cm/m  RA Area:     13.70 cm LA Vol (A2C):   71.5 ml 41.36 ml/m RA Volume:   32.80 ml  18.97 ml/m LA Vol (A4C):   82.7 ml 47.83 ml/m LA Biplane Vol: 78.7 ml 45.52 ml/m  AORTIC VALVE LVOT Vmax:   159.00 cm/s LVOT Vmean:  97.900 cm/s LVOT VTI:    0.330 m  AORTA Ao Root diam: 3.20 cm MITRAL VALVE               TRICUSPID VALVE MV Area (PHT): 2.58 cm    TR Peak grad:   23.2 mmHg MV Decel Time: 294 msec    TR Vmax:        241.00 cm/s MV E velocity: 81.90 cm/s MV A velocity: 86.00 cm/s  SHUNTS MV E/A ratio:  0.95        Systemic VTI:  0.33 m                            Systemic Diam: 2.20 cm Fransico Him MD Electronically signed by Fransico Him MD Signature  Date/Time: 12/17/2020/4:09:18 PM    Final         Subjective: Patient feeling better, no nausea or vomiting, no chest pain or dyspnea,.   Discharge Exam: Vitals:   12/20/20 2023 12/21/20 1350  BP: (!) 145/71 (!) 146/70  Pulse: 64 (!) 34  Resp: 15 13  Temp: 98.2 F (36.8 C) 98.3 F (36.8 C)  SpO2: 99% 99%   Vitals:   12/20/20 1136 12/20/20 2023 12/21/20 0500 12/21/20 1350  BP: (!) 153/70 (!) 145/71  (!) 146/70  Pulse:  64  (!) 34  Resp:  15  13  Temp:  98.2 F (36.8 C)  98.3 F (36.8 C)  TempSrc:  Oral  Oral  SpO2:  99%  99%  Weight:   59.9 kg   Height:        General: Not in pain or dyspnea.  Neurology: Awake and alert, non focal  E ENT: no pallor, no icterus, oral mucosa moist Cardiovascular: No JVD. S1-S2 present, rhythmic, no gallops, rubs, or murmurs. No lower extremity edema. Pulmonary: positive breath sounds bilaterally, adequate air movement, no wheezing, rhonchi or rales. Gastrointestinal. Abdomen soft and non tender  Skin. No rashes Musculoskeletal: no joint deformities   The results of significant diagnostics from this hospitalization (including imaging, microbiology, ancillary and laboratory) are listed below for reference.     Microbiology: Recent Results (from the past 240 hour(s))  Culture, blood (routine x 2)     Status: Abnormal   Collection Time: 12/15/20  1:06 AM   Specimen: BLOOD  Result Value Ref Range Status   Specimen Description   Final    BLOOD LEFT ARM Performed at [facility] [address], [place], [place] [address]    Special Requests   Final    BOTTLES DRAWN AEROBIC AND ANAEROBIC Blood Culture adequate volume Performed at [facility] [address], [place], [place] [address]    Culture  Setup Time   Final    IN BOTH AEROBIC AND ANAEROBIC BOTTLES GRAM POSITIVE COCCI CRITICAL VALUE NOTED.  VALUE IS CONSISTENT WITH PREVIOUSLY REPORTED AND CALLED VALUE.    Culture (A)  Final     STAPHYLOCOCCUS EPIDERMIDIS SUSCEPTIBILITIES PERFORMED  ON PREVIOUS CULTURE WITHIN THE LAST 5 DAYS. Performed at [facility], [place] [address], [place], [place] [address]    Report Status 12/19/2020 FINAL  Final  Urine culture     Status: None   Collection Time: 12/15/20  5:38 PM   Specimen: Urine, Clean Catch  Result Value Ref Range Status   Specimen Description   Final    URINE, CLEAN CATCH Performed at [facility], [place] [address], [place], [place] [address]    Special Requests   Final    NONE Performed at [facility], [place] [address], [place], [place] [address]    Culture   Final    NO GROWTH Performed at [facility], [place] [address], [place], [place] [address]    Report Status 12/17/2020 FINAL  Final  Culture, blood (routine x 2)     Status: Abnormal   Collection Time: 12/15/20  7:16 PM   Specimen: BLOOD  Result Value Ref Range Status   Specimen Description   Final    BLOOD PICC LINE Performed at [facility] [address], [place], [place] [address]    Special Requests   Final    BOTTLES DRAWN AEROBIC AND ANAEROBIC Blood Culture adequate volume Performed at [facility] [address], [place], [place] [address]    Culture  Setup Time   Final    IN BOTH AEROBIC AND ANAEROBIC BOTTLES GRAM POSITIVE COCCI CRITICAL RESULT CALLED TO, READ BACK BY AND VERIFIED WITH: Elenore Paddy [facility] 12/16/20 2330 JDW Performed at [facility], [place] [address] [place], [place] [address]    Culture STAPHYLOCOCCUS EPIDERMIDIS (A)  Final   Report Status 12/19/2020 FINAL  Final   Organism ID, Bacteria STAPHYLOCOCCUS EPIDERMIDIS  Final      Susceptibility   Staphylococcus epidermidis - MIC*    CIPROFLOXACIN >=8 RESISTANT Resistant     ERYTHROMYCIN >=8 RESISTANT Resistant     GENTAMICIN >=16 RESISTANT Resistant     OXACILLIN >=4 RESISTANT Resistant     TETRACYCLINE 2 SENSITIVE Sensitive      VANCOMYCIN 1 SENSITIVE Sensitive     TRIMETH/SULFA 80 RESISTANT Resistant     CLINDAMYCIN >=8 RESISTANT Resistant     RIFAMPIN <=0.5 SENSITIVE Sensitive     Inducible Clindamycin NEGATIVE Sensitive     * STAPHYLOCOCCUS EPIDERMIDIS  Blood Culture ID Panel (Reflexed)     Status: Abnormal   Collection Time: 12/15/20  7:16 PM  Result Value Ref Range Status   Enterococcus faecalis NOT DETECTED NOT DETECTED Final   Enterococcus Faecium NOT DETECTED NOT DETECTED Final   Listeria monocytogenes NOT DETECTED NOT DETECTED Final   Staphylococcus species DETECTED (A) NOT DETECTED Final    Comment: CRITICAL RESULT CALLED TO, READ BACK BY AND VERIFIED WITH: L POINDEXTER PHARMD 12/16/20 2330 JDW    Staphylococcus aureus (BCID) NOT DETECTED NOT DETECTED Final   Staphylococcus epidermidis DETECTED (A) NOT DETECTED Final    Comment: Methicillin (oxacillin) resistant coagulase negative staphylococcus. Possible blood culture contaminant (unless isolated from more than one blood culture draw or clinical case suggests pathogenicity). No antibiotic treatment is indicated for blood  culture contaminants. CRITICAL RESULT CALLED TO, READ BACK BY AND VERIFIED WITH: L POINDEXTER PHARMD 12/16/20 2230 JDW    Staphylococcus lugdunensis NOT DETECTED NOT DETECTED Final   Streptococcus species NOT DETECTED NOT DETECTED Final   Streptococcus agalactiae NOT DETECTED NOT DETECTED Final   Streptococcus pneumoniae NOT DETECTED NOT  DETECTED Final   Streptococcus pyogenes NOT DETECTED NOT DETECTED Final   A.calcoaceticus-baumannii NOT DETECTED NOT DETECTED Final   Bacteroides fragilis NOT DETECTED NOT DETECTED Final   Enterobacterales NOT DETECTED NOT DETECTED Final   Enterobacter cloacae complex NOT DETECTED NOT DETECTED Final   Escherichia coli NOT DETECTED NOT DETECTED Final   Klebsiella aerogenes NOT DETECTED NOT DETECTED Final   Klebsiella oxytoca NOT DETECTED NOT DETECTED Final   Klebsiella pneumoniae NOT DETECTED NOT  DETECTED Final   Proteus species NOT DETECTED NOT DETECTED Final   Salmonella species NOT DETECTED NOT DETECTED Final   Serratia marcescens NOT DETECTED NOT DETECTED Final   Haemophilus influenzae NOT DETECTED NOT DETECTED Final   Neisseria meningitidis NOT DETECTED NOT DETECTED Final   Pseudomonas aeruginosa NOT DETECTED NOT DETECTED Final   Stenotrophomonas maltophilia NOT DETECTED NOT DETECTED Final   Candida albicans NOT DETECTED NOT DETECTED Final   Candida auris NOT DETECTED NOT DETECTED Final   Candida glabrata NOT DETECTED NOT DETECTED Final   Candida krusei NOT DETECTED NOT DETECTED Final   Candida parapsilosis NOT DETECTED NOT DETECTED Final   Candida tropicalis NOT DETECTED NOT DETECTED Final   Cryptococcus neoformans/gattii NOT DETECTED NOT DETECTED Final   Methicillin resistance mecA/C DETECTED (A) NOT DETECTED Final    Comment: CRITICAL RESULT CALLED TO, READ BACK BY AND VERIFIED WITH: L POINDEXTER PHARMD 12/16/20 2330 JDW Performed at [facility], [address] [place], [place] [address]   Culture, blood (routine x 2)     Status: Abnormal   Collection Time: 12/16/20 12:15 PM   Specimen: BLOOD  Result Value Ref Range Status   Specimen Description   Final    BLOOD FROM CVC Performed at [facility] [address] [place], [place] [address]    Special Requests   Final    BOTTLES DRAWN AEROBIC AND ANAEROBIC Blood Culture adequate volume Performed at [facility] [address], [place], [place] [address]    Culture  Setup Time   Final    IN BOTH AEROBIC AND ANAEROBIC BOTTLES GRAM POSITIVE COCCI CRITICAL VALUE NOTED.  VALUE IS CONSISTENT WITH PREVIOUSLY REPORTED AND CALLED VALUE.    Culture (A)  Final    STAPHYLOCOCCUS EPIDERMIDIS SUSCEPTIBILITIES PERFORMED ON PREVIOUS CULTURE WITHIN THE LAST 5 DAYS. Performed at [facility], [place] [address] [place], [place] [address]    Report Status 12/19/2020 FINAL  Final   Culture, blood (routine x 2)     Status: None   Collection Time: 12/16/20  1:36 PM   Specimen: BLOOD  Result Value Ref Range Status   Specimen Description   Final    BLOOD RIGHT ANTECUBITAL Performed at [facility] [address], [place], [place] [address]    Special Requests   Final    BOTTLES DRAWN AEROBIC ONLY Blood Culture results may not be optimal due to an inadequate volume of blood received in culture bottles Performed at [facility] [address], [place], [place] [address]    Culture   Final    NO GROWTH 5 DAYS Performed at [facility], [place] [address] [place], [place] [address]    Report Status 12/21/2020 FINAL  Final  Culture, blood (Routine X 2) w Reflex to ID Panel     Status: None (Preliminary result)   Collection Time: 12/18/20  7:38 PM   Specimen: BLOOD RIGHT FOREARM  Result Value Ref Range Status  Specimen Description   Final    BLOOD RIGHT FOREARM Performed at [facility] [address] [place], [place] [address]    Special Requests   Final    BOTTLES DRAWN AEROBIC ONLY Blood Culture results may not be optimal due to an excessive volume of blood received in culture bottles Performed at [facility] [address] [place], [place] [address]    Culture   Final    NO GROWTH 3 DAYS Performed at [facility], [place] [address], [place], [place] [address]    Report Status PENDING  Incomplete  Culture, blood (Routine X 2) w Reflex to ID Panel     Status: None (Preliminary result)   Collection Time: 12/18/20  7:38 PM   Specimen: BLOOD RIGHT HAND  Result Value Ref Range Status   Specimen Description   Final    BLOOD RIGHT HAND Performed at [facility] [address] [place], [place] [address]    Special Requests   Final    BOTTLES DRAWN AEROBIC ONLY Blood Culture results may not be optimal due to an excessive volume of blood received in culture  bottles Performed at [facility] [address] [place], [place] [address]    Culture   Final    NO GROWTH 3 DAYS Performed at [facility], [place] [address], [place], [place] [address]    Report Status PENDING  Incomplete     Labs: BNP (last 3 results) No results for input(s): BNP in the last 8760 hours. Basic Metabolic Panel: Recent Labs  Lab 12/17/20 0500 12/18/20 0355 12/19/20 0405 12/20/20 0436 12/21/20 0500  NA 134* 136 138 138 138  K 3.7 3.3* 3.7 3.8 4.1  CL 102 104 104 103 100  CO2 24 27 25 25 29   GLUCOSE 140* 125* 114* 106* 116*  BUN 25* 20 24* 28* 28*  CREATININE 1.28* 1.07* 1.12* 1.15* 1.05*  CALCIUM 8.6* 8.8* 9.0 9.0 9.2  MG 2.0 1.8 1.9 2.3 2.2  PHOS 2.7 2.7 3.1 2.8 3.9   Liver Function Tests: Recent Labs  Lab 12/17/20 0500 12/18/20 0355 12/19/20 0405 12/20/20 0436 12/21/20 0500  AST 18 13* 11* 11* 12*  ALT 18 15 13 12 12   ALKPHOS 137* 128* 117 120 123  BILITOT 0.5 0.5 0.4 0.4 0.6  PROT 5.9* 5.8* 6.0* 6.1* 6.1*  ALBUMIN 2.6* 2.5* 2.6* 2.7* 2.7*   No results for input(s): LIPASE, AMYLASE in the last 168 hours. No results for input(s): AMMONIA in the last 168 hours. CBC: Recent Labs  Lab 12/16/20 1215 12/17/20 0500 12/18/20 0355 12/19/20 0405 12/20/20 0436  WBC 5.1 4.0 3.9* 3.7* 3.6*  NEUTROABS 4.0 2.3 2.0 1.7 1.6*  HGB 8.0* 7.9* 8.0* 8.1* 7.7*  HCT 24.3* 24.2* 24.2* 24.7* 22.9*  MCV 91.7 91.3 91.0 92.5 91.2  PLT 147* 162 159 172 157   Cardiac Enzymes: Recent Labs  Lab 12/18/20 0355  CKTOTAL 25*   BNP: Invalid input(s): POCBNP CBG: Recent Labs  Lab 12/20/20 1105 12/20/20 1544 12/20/20 2026 12/21/20 0816 12/21/20 1154  GLUCAP 100* 83 103* 106* 101*   D-Dimer Recent Labs    12/20/20 0436 12/21/20 0500  DDIMER 0.79* 0.92*   Hgb A1c No results for input(s): HGBA1C in the last 72 hours. Lipid Profile Recent Labs    12/19/20 0405  TRIG 51   Thyroid function studies No results for input(s):  TSH, T4TOTAL, T3FREE, THYROIDAB in the last 72  hours.  Invalid input(s): FREET3 Anemia work up Recent Labs    12/20/20 0436 12/21/20 0500  FERRITIN 46 44   Urinalysis    Component Value Date/Time   COLORURINE STRAW (A) 12/15/2020 1738   APPEARANCEUR CLEAR 12/15/2020 1738   LABSPEC 1.012 12/15/2020 1738   PHURINE 5.0 12/15/2020 1738   [place] 12/15/2020 1738   HGBUR NEGATIVE 12/15/2020 1738   [place] 12/15/2020 [place] 12/15/2020 1738   PROTEINUR 30 (A) 12/15/2020 1738   NITRITE NEGATIVE 12/15/2020 1738   LEUKOCYTESUR NEGATIVE 12/15/2020 1738   Sepsis Labs Invalid input(s): PROCALCITONIN,  WBC,  LACTICIDVEN Microbiology Recent Results (from the past 240 hour(s))  Culture, blood (routine x 2)     Status: Abnormal   Collection Time: 12/15/20  1:06 AM   Specimen: BLOOD  Result Value Ref Range Status   Specimen Description   Final    BLOOD LEFT ARM Performed at [facility], [place] [address], [place], [place] [address]    Special Requests   Final    BOTTLES DRAWN AEROBIC AND ANAEROBIC Blood Culture adequate volume Performed at [facility] [address] [place], [place] [address]    Culture  Setup Time   Final    IN BOTH AEROBIC AND ANAEROBIC BOTTLES GRAM POSITIVE COCCI CRITICAL VALUE NOTED.  VALUE IS CONSISTENT WITH PREVIOUSLY REPORTED AND CALLED VALUE.    Culture (A)  Final    STAPHYLOCOCCUS EPIDERMIDIS SUSCEPTIBILITIES PERFORMED ON PREVIOUS CULTURE WITHIN THE LAST 5 DAYS. Performed at [facility], [place] [address] [place], [place] [address]    Report Status 12/19/2020 FINAL  Final  Urine culture     Status: None   Collection Time: 12/15/20  5:38 PM   Specimen: Urine, Clean Catch  Result Value Ref Range Status   Specimen Description   Final    URINE, CLEAN CATCH Performed at [facility] [address] [facility], [place] [address] [place], [place] [address]    Special Requests   Final     NONE Performed at [facility], [place] [address], [place], [place] [address]    Culture   Final    NO GROWTH Performed at [facility], [place] [address] [place], [place] [address]    Report Status 12/17/2020 FINAL  Final  Culture, blood (routine x 2)     Status: Abnormal   Collection Time: 12/15/20  7:16 PM   Specimen: BLOOD  Result Value Ref Range Status   Specimen Description   Final    BLOOD PICC LINE Performed at [facility] [address], [place], [place] [address]    Special Requests   Final    BOTTLES DRAWN AEROBIC AND ANAEROBIC Blood Culture adequate volume Performed at [facility] [address] [place], [place] [address]    Culture  Setup Time   Final    IN BOTH AEROBIC AND ANAEROBIC BOTTLES GRAM POSITIVE COCCI CRITICAL RESULT CALLED TO, READ BACK BY AND VERIFIED WITH: El[place] Paddy [facility] 12/16/20 2330 JDW Performed at [facility], [place] [address], [place], [place] [address]    Culture STAPHYLOCOCCUS EPIDERMIDIS (A)  Final   Report Status 12/19/2020 FINAL  Final   Organism ID, Bacteria STAPHYLOCOCCUS EPIDERMIDIS  Final      Susceptibility   Staphylococcus epidermidis - MIC*    CIPROFLOXACIN >=8 RESISTANT Resistant     ERYTHROMYCIN >=8 RESISTANT Resistant     GENTAMICIN >=16 RESISTANT  Resistant     OXACILLIN >=4 RESISTANT Resistant     TETRACYCLINE 2 SENSITIVE Sensitive     VANCOMYCIN 1 SENSITIVE Sensitive     TRIMETH/SULFA 80 RESISTANT Resistant     CLINDAMYCIN >=8 RESISTANT Resistant     RIFAMPIN <=0.5 SENSITIVE Sensitive     Inducible Clindamycin NEGATIVE Sensitive     * STAPHYLOCOCCUS EPIDERMIDIS  Blood Culture ID Panel (Reflexed)     Status: Abnormal   Collection Time: 12/15/20  7:16 PM  Result Value Ref Range Status   Enterococcus faecalis NOT DETECTED NOT DETECTED Final   Enterococcus Faecium NOT DETECTED NOT DETECTED Final   Listeria monocytogenes NOT DETECTED NOT DETECTED Final    Staphylococcus species DETECTED (A) NOT DETECTED Final    Comment: CRITICAL RESULT CALLED TO, READ BACK BY AND VERIFIED WITH: L POINDEXTER PHARMD 12/16/20 2330 JDW    Staphylococcus aureus (BCID) NOT DETECTED NOT DETECTED Final   Staphylococcus epidermidis DETECTED (A) NOT DETECTED Final    Comment: Methicillin (oxacillin) resistant coagulase negative staphylococcus. Possible blood culture contaminant (unless isolated from more than one blood culture draw or clinical case suggests pathogenicity). No antibiotic treatment is indicated for blood  culture contaminants. CRITICAL RESULT CALLED TO, READ BACK BY AND VERIFIED WITH: L POINDEXTER PHARMD 12/16/20 2230 JDW    Staphylococcus lugdunensis NOT DETECTED NOT DETECTED Final   Streptococcus species NOT DETECTED NOT DETECTED Final   Streptococcus agalactiae NOT DETECTED NOT DETECTED Final   Streptococcus pneumoniae NOT DETECTED NOT DETECTED Final   Streptococcus pyogenes NOT DETECTED NOT DETECTED Final   A.calcoaceticus-baumannii NOT DETECTED NOT DETECTED Final   Bacteroides fragilis NOT DETECTED NOT DETECTED Final   Enterobacterales NOT DETECTED NOT DETECTED Final   Enterobacter cloacae complex NOT DETECTED NOT DETECTED Final   Escherichia coli NOT DETECTED NOT DETECTED Final   Klebsiella aerogenes NOT DETECTED NOT DETECTED Final   Klebsiella oxytoca NOT DETECTED NOT DETECTED Final   Klebsiella pneumoniae NOT DETECTED NOT DETECTED Final   Proteus species NOT DETECTED NOT DETECTED Final   Salmonella species NOT DETECTED NOT DETECTED Final   Serratia marcescens NOT DETECTED NOT DETECTED Final   Haemophilus influenzae NOT DETECTED NOT DETECTED Final   Neisseria meningitidis NOT DETECTED NOT DETECTED Final   Pseudomonas aeruginosa NOT DETECTED NOT DETECTED Final   Stenotrophomonas maltophilia NOT DETECTED NOT DETECTED Final   Candida albicans NOT DETECTED NOT DETECTED Final   Candida auris NOT DETECTED NOT DETECTED Final   Candida glabrata  NOT DETECTED NOT DETECTED Final   Candida krusei NOT DETECTED NOT DETECTED Final   Candida parapsilosis NOT DETECTED NOT DETECTED Final   Candida tropicalis NOT DETECTED NOT DETECTED Final   Cryptococcus neoformans/gattii NOT DETECTED NOT DETECTED Final   Methicillin resistance mecA/C DETECTED (A) NOT DETECTED Final    Comment: CRITICAL RESULT CALLED TO, READ BACK BY AND VERIFIED WITH: L POINDEXTER PHARMD 12/16/20 2330 JDW Performed at [facility], [address], [place], [place] [address]   Culture, blood (routine x 2)     Status: Abnormal   Collection Time: 12/16/20 12:15 PM   Specimen: BLOOD  Result Value Ref Range Status   Specimen Description   Final    BLOOD FROM CVC Performed at [facility] [address], [place], [place] [address]    Special Requests   Final    BOTTLES DRAWN AEROBIC AND ANAEROBIC Blood Culture adequate volume Performed at [facility] [address], [place], [place] [address]  Culture  Setup Time   Final    IN BOTH AEROBIC AND ANAEROBIC BOTTLES GRAM POSITIVE COCCI CRITICAL VALUE NOTED.  VALUE IS CONSISTENT WITH PREVIOUSLY REPORTED AND CALLED VALUE.    Culture (A)  Final    STAPHYLOCOCCUS EPIDERMIDIS SUSCEPTIBILITIES PERFORMED ON PREVIOUS CULTURE WITHIN THE LAST 5 DAYS. Performed at [facility], [place] [address] [place], [place] [address]    Report Status 12/19/2020 FINAL  Final  Culture, blood (routine x 2)     Status: None   Collection Time: 12/16/20  1:36 PM   Specimen: BLOOD  Result Value Ref Range Status   Specimen Description   Final    BLOOD RIGHT ANTECUBITAL Performed at [facility] [address] [place], [place] [address]    Special Requests   Final    BOTTLES DRAWN AEROBIC ONLY Blood Culture results may not be optimal due to an inadequate volume of blood received in culture bottles Performed at [facility] [address], [place],  [place] [address]    Culture   Final    NO GROWTH 5 DAYS Performed at [facility], [place] [address] [place], [place] [address]    Report Status 12/21/2020 FINAL  Final  Culture, blood (Routine X 2) w Reflex to ID Panel     Status: None (Preliminary result)   Collection Time: 12/18/20  7:38 PM   Specimen: BLOOD RIGHT FOREARM  Result Value Ref Range Status   Specimen Description   Final    BLOOD RIGHT FOREARM Performed at [facility] [address] [place], [place] [address]    Special Requests   Final    BOTTLES DRAWN AEROBIC ONLY Blood Culture results may not be optimal due to an excessive volume of blood received in culture bottles Performed at [facility] [address] [place], [place] [address]    Culture   Final    NO GROWTH 3 DAYS Performed at [facility], [place] [address], [place], [place] [address]    Report Status PENDING  Incomplete  Culture, blood (Routine X 2) w Reflex to ID Panel     Status: None (Preliminary result)   Collection Time: 12/18/20  7:38 PM   Specimen: BLOOD RIGHT HAND  Result Value Ref Range Status   Specimen Description   Final    BLOOD RIGHT HAND Performed at [facility] [address], [place], [place] [address]    Special Requests   Final    BOTTLES DRAWN AEROBIC ONLY Blood Culture results may not be optimal due to an excessive volume of blood received in culture bottles Performed at [facility] [address] [place], [place] [address]    Culture   Final    NO GROWTH 3 DAYS Performed at [facility], [place] [address], [place], [place] [address]    Report Status PENDING  Incomplete     Time coordinating discharge: 45 minutes  SIGNED:   Tawni Millers, MD  [facility] 12/21/2020, 6:45 PM

## 2020-12-21 NOTE — TOC Progression Note (Addendum)
Transition of Care [facility]) - Progression Note    Patient Details  Name: NUMA HEATWOLE MRN: [id number] Date of Birth: November 03, 1961  [facility]) CM/SW Contact  Purcell Mouton, RN Phone Number: 12/21/2020, 10:38 AM  Clinical Narrative:    [facility] following pt for TPN/ABX and [facility] for [facility].    Expected Discharge Plan: Home/Self Care Barriers to Discharge: No Barriers Identified  Expected Discharge Plan and Services Expected Discharge Plan: Home/Self Care       Living arrangements for the past 2 months: Single Family Home                                       Social Determinants of Health (SDOH) Interventions    Readmission Risk Interventions Readmission Risk Prevention Plan 11/09/2020 09/09/2020 08/05/2020  Transportation Screening Complete Complete Complete  PCP or Specialist Appt within 3-5 Days - - -  Not Complete comments - - -  HRI or [place] Work Consult for [place] Planning/Counseling - - -  SW consult not completed comments - - -  Palliative Care Screening - - -  Medication Review Press photographer) Complete Complete Complete  PCP or Specialist appointment within 3-5 days of discharge Complete Complete Complete  HRI or Home Care Consult Complete Complete Complete  SW Recovery Care/Counseling Consult - Complete Complete  Palliative Care Screening Not Applicable Not Applicable Not [place] Not Applicable Not Applicable Not Applicable

## 2020-12-21 NOTE — Progress Notes (Signed)
PHARMACY CONSULT NOTE FOR:  OUTPATIENT  PARENTERAL ANTIBIOTIC THERAPY (OPAT)  Indication: MRSE bacteremia w/ associated right femoral catheter Regimen: Daptomycin 500 mg IV every 24 hours  Vancomycin 2.5 mg/mL + heparin 2500 units/mL instilled in each lumen of double lumen catheter as lock therapy for at least 2 hours/daily  End date: 01/01/21  IV antibiotic discharge orders are pended. To discharging provider:  please sign these orders via discharge navigator,  Select New Orders & click on the button choice - Manage This Unsigned Work.     Thank you for allowing pharmacy to be a part of this patient's care.  Jimmy Footman, PharmD, BCPS, BCIDP Infectious Diseases Clinical Pharmacist Phone: [phone] 12/21/2020, 10:16 AM

## 2020-12-23 ENCOUNTER — Encounter: Payer: Medicare Other | Admitting: Internal Medicine

## 2020-12-23 LAB — CULTURE, BLOOD (ROUTINE X 2)
Culture: NO GROWTH
Culture: NO GROWTH

## 2020-12-26 ENCOUNTER — Other Ambulatory Visit: Payer: Self-pay

## 2020-12-26 ENCOUNTER — Ambulatory Visit (INDEPENDENT_AMBULATORY_CARE_PROVIDER_SITE_OTHER): Payer: Medicare Other | Admitting: Infectious Disease

## 2020-12-26 ENCOUNTER — Encounter: Payer: Self-pay | Admitting: Infectious Disease

## 2020-12-26 VITALS — BP 150/80 | HR 71 | Temp 98.0°F | Ht 68.0 in | Wt 144.0 lb

## 2020-12-26 DIAGNOSIS — N1832 Chronic kidney disease, stage 3b: Secondary | ICD-10-CM

## 2020-12-26 DIAGNOSIS — R7881 Bacteremia: Secondary | ICD-10-CM

## 2020-12-26 DIAGNOSIS — T827XXA Infection and inflammatory reaction due to other cardiac and vascular devices, implants and grafts, initial encounter: Secondary | ICD-10-CM | POA: Diagnosis not present

## 2020-12-26 DIAGNOSIS — U071 COVID-19: Secondary | ICD-10-CM | POA: Diagnosis not present

## 2020-12-26 DIAGNOSIS — T80219D Unspecified infection due to central venous catheter, subsequent encounter: Secondary | ICD-10-CM

## 2020-12-26 DIAGNOSIS — K50819 Crohn's disease of both small and large intestine with unspecified complications: Secondary | ICD-10-CM

## 2020-12-26 DIAGNOSIS — T827XXD Infection and inflammatory reaction due to other cardiac and vascular devices, implants and grafts, subsequent encounter: Secondary | ICD-10-CM

## 2020-12-26 DIAGNOSIS — Z789 Other specified health status: Secondary | ICD-10-CM

## 2020-12-26 NOTE — Progress Notes (Signed)
Subjective:  Chief complaint: Follow-up for recurrent bloodstream infections  Patient ID: Caitlin Peters, female    DOB: Apr 02, 1961, 60 y.o.   MRN: [id number]  HPI Caitlin Peters is a 60 y.o. 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  I last saw her in clinic on 14 November 2020.  Her multiple bacteremias and line associated infections are documented in my last clinic note as well as several of my partners in my notes.  She most recently had an Acinetobacter bacteremia and was treated with meropenem with retention of the central line.  When I saw her in the clinic she seemed to be doing relatively well.  She was quite somnolent prior to me seeing her but I saw her she was clearly awake.  Since that time she was admitted to the hospital with fever myalgia and cough and found to have COVID-19 infection.  She was given remdesivir over 3-day course and discharged to home.  She returned to the ER at [facility] after having had a witnessed seizure by her daughter.  He has been seen by neurology of started Keppra.  She had a fevers and SIRS on admission and blood cultures have now yielded coagulase-negative Staphylococcus Epidermidis.  She was placed on daptomycin and arrangements were made for her to continue on daptomycin along with vancomycin locking at least twice a day.  She is due to finish antimicrobial therapy on January 01, 2021.   Past Medical History:  Diagnosis Date  . Acute pancreatitis 04/13/2020  . Anasarca 10/2019  . AVN (avascular necrosis of bone) ([place])   . Cataract   . Choledocholithiasis (sludge) s/p ERCP 10/2019 10/21/2020  . Chronic pain syndrome   . CKD (chronic kidney disease), stage III ([place])   . Crohn disease ([place])   . Crohn's disease of small & large intestine with SGS  1984   [name] is a 59year old female with a history of Crohn's disease diagnosed in 22 (age 89), history of long-term steroid use with osteoporosis, S/P multiple bowel resections ([id number]) complicated by chronic back and abdominal pain, steatorrhea and short bowel syndrome. The patient has been left with ~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 attached to proximal transverse colon through rectum. She has been  . Depression   . Diverticulosis   . GERD (gastroesophageal reflux disease)   . HTN (hypertension)   . IDA (iron deficiency anemia)   . Malnutrition (Caitlin Peters)   . Mass in chest   . Osteoporosis   . Pancreatitis   . 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; 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; jejunal to transverse colon anastomosis Treated at [place] SB lengthening to 165cm Dr Alene Mires, [facility]  ESTABLISHED AT [facility]  . Vitamin B12 deficiency     Past Surgical History:  Procedure Laterality Date  . ABDOMINAL ADHESION SURGERY  01/22/2018  . APPENDECTOMY  1989  . BILIARY DILATION  11/26/2019   Procedure: BILIARY DILATION;  Surgeon: Jackquline Denmark, MD;  Location: [facility];  Service: Endoscopy;;  . BILIARY DILATION  03/08/2020   Procedure: BILIARY DILATION;  Surgeon: Irving Copas., MD;  Location: [place];  Service: Gastroenterology;;  . BIOPSY  03/08/2020   Procedure: BIOPSY;  Surgeon: Irving Copas., MD;  Location:  [facility];  Service: Gastroenterology;;  . CHEST WALL RESECTION     right thoracotomy,resection of chest mass with anterior rib and reconstruction using prosthetic mesh and video arthroscopy  . CHOLECYSTECTOMY  01/22/2018  . COLONOSCOPY  2019  . ENTEROSTOMY CLOSURE  04/1999  . ERCP N/A 11/26/2019   Procedure: ENDOSCOPIC RETROGRADE CHOLANGIOPANCREATOGRAPHY (ERCP);  Surgeon: Jackquline Denmark, MD;  Location: [facility];  Service: Endoscopy;  Laterality: N/A;  . ERCP N/A  03/08/2020   Procedure: ENDOSCOPIC RETROGRADE CHOLANGIOPANCREATOGRAPHY (ERCP);  Surgeon: Irving Copas., MD;  Location: [place];  Service: Gastroenterology;  Laterality: N/A;  . ESOPHAGOGASTRODUODENOSCOPY N/A 03/08/2020   Procedure: ESOPHAGOGASTRODUODENOSCOPY (EGD);  Surgeon: Irving Copas., MD;  Location: [place];  Service: Gastroenterology;  Laterality: N/A;  . EUS N/A 03/08/2020   Procedure: UPPER ENDOSCOPIC ULTRASOUND (EUS) LINEAR;  Surgeon: Irving Copas., MD;  Location: [place];  Service: Gastroenterology;  Laterality: N/A;  . ILEOCECETOMY  03/1999   ileocolon resection with abdominal stoma  . ILEOSTOMY CLOSURE  2001  . IR FLUORO GUIDE CV LINE LEFT  01/07/2020  . IR FLUORO GUIDE CV LINE LEFT  03/09/2020  . IR FLUORO GUIDE CV LINE LEFT  05/09/2020  . IR FLUORO GUIDE CV LINE LEFT  07/20/2020  . IR FLUORO GUIDE CV LINE RIGHT  08/05/2020  . IR PTA VENOUS EXCEPT DIALYSIS CIRCUIT  01/07/2020  . IR REMOVAL TUN CV CATH W/O FL  08/05/2020  . IR US GUIDE VASC ACCESS LEFT     x 2 06/17/19 and 09/14/2019  . IR US GUIDE VASC ACCESS RIGHT  08/05/2020  . KNEE SURGERY     right knee   . LAPAROSCOPIC SMALL BOWEL RESECTION  2009   2000-2009.  SB resections for Crohns Disease - now with Short gut  . OMENTECTOMY  01/22/2018  . PARTIAL HYSTERECTOMY  1984   with LSO  . REMOVAL OF STONES  11/26/2019   Procedure: REMOVAL OF STONES;  Surgeon: Jackquline Denmark, MD;  Location: [facility];  Service: Endoscopy;;  . REMOVAL OF STONES  03/08/2020   Procedure: REMOVAL OF STONES;  Surgeon: Irving Copas., MD;  Location: [place];  Service: Gastroenterology;;  . SALPINGOOPHORECTOMY Left 1984  . SALPINGOOPHORECTOMY Right 1990  . SERIAL TRANSVERSE ENTEROPLASTY (STEP) - SMALL BOWEL LENGTHENING  01/22/2018   Dr Alene Mires, [place] length from 120 to 165cm   . SMALL INTESTINE SURGERY  2002  . SMALL INTESTINE SURGERY  2003  . SPHINCTEROTOMY  11/26/2019    Procedure: SPHINCTEROTOMY;  Surgeon: Jackquline Denmark, MD;  Location: [facility];  Service: Endoscopy;;  . TOTAL ABDOMINAL HYSTERECTOMY  1990   with RSO  . UPPER GASTROINTESTINAL ENDOSCOPY      Family History  Problem Relation Age of Onset  . Breast cancer Sister   . Multiple sclerosis Sister   . Diabetes Sister   . Lupus Sister   . Colon cancer Other   . Crohn's disease Other   . Seizures Mother   . Glaucoma Mother   . CAD Father   . Heart disease Father   . Hypertension Father       Social History   Socioeconomic History  . Marital status: Single    Spouse name: Not on file  . Number of children: Not on file  . Years of education: Not on file  . Highest education level: Not on file  Occupational History  . Occupation: disabled  Tobacco Use  . Smoking status: Former Research scientist (life sciences)  .  Smokeless tobacco: Never Used  Vaping Use  . Vaping Use: Never used  Substance and Sexual Activity  . Alcohol use: Not Currently  . Drug use: Never  . Sexual activity: Yes  Other Topics Concern  . Not on file  Social History Narrative  . Not on file   Social Determinants of Health   Financial Resource Strain: Low Risk   . Difficulty of Paying Living Expenses: Not hard at all  Food Insecurity: Not on file  Transportation Needs: No Transportation Needs  . Lack of Transportation (Medical): No  . Lack of Transportation (Non-Medical): No  Physical Activity: Not on file  Stress: Not on file  Social Connections: Not on file    Allergies  Allergen Reactions  . Meperidine Hives    Other reaction(s): GI Upset Due to [place]   . Hyoscyamine Hives and Swelling    Legs swelling   Disorientation  . Cefepime Other (See Comments)    Neurotoxicity occurring in setting of AKI. Ceftriaxone tolerated during same admit  . Gabapentin Other (See Comments)    unknown  . Lyrica [Pregabalin] Other (See Comments)    unknown  . Topamax [Topiramate] Other (See Comments)    unknown  . Zosyn  [Piperacillin Sod-Tazobactam So]     Patient reports it makes her vomit, her neck stiff, and her "heart feel funny"  . Fentanyl Rash    Pt is allergic to fentanyl patch related to the glue (gives her a rash) Pt states she is NOT allergic to fentanyl IV medicine  . Morphine And Related Rash     Current Outpatient Medications:  .  acetaminophen (TYLENOL) 325 MG tablet, Take 650 mg by mouth every 6 (six) hours as needed for mild pain. , Disp: , Rfl:  .  amLODipine (NORVASC) 10 MG tablet, Take 1 tablet (10 mg total) by mouth daily., Disp: 90 tablet, Rfl: 1 .  budesonide (ENTOCORT EC) 3 MG 24 hr capsule, TAKE 3 CAPSULES BY MOUTH ONCE DAILY (Patient taking differently: Take 9 mg by mouth daily.), Disp: 90 capsule, Rfl: 0 .  Calcium 200 MG TABS, Take 200 mg by mouth daily., Disp: , Rfl:  .  cholecalciferol (VITAMIN D3) 25 MCG (1000 UT) tablet, Take 1,000 Units by mouth daily. , Disp: , Rfl:  .  cycloSPORINE (RESTASIS) 0.05 % ophthalmic emulsion, Place 1 drop into both eyes 2 (two) times daily., Disp: , Rfl:  .  daptomycin (CUBICIN) IVPB, Inject 500 mg into the vein daily for 11 days. Indication:  MRSE bacteremia w/ femoral cath  First Dose: Yes Last Day of Therapy:  01/01/21 Labs - Once weekly:  CBC/D, BMP, and CPK Labs - Every other week:  ESR and CRP Method of administration: IV Push Method of administration may be changed at the discretion of home infusion pharmacist based upon assessment of the patient and/or caregiver's ability to self-administer the medication ordered., Disp: 11 Units, Rfl: 0 .  dexlansoprazole (DEXILANT) 60 MG capsule, Take 1 capsule (60 mg total) by mouth daily., Disp: 90 capsule, Rfl: 1 .  Dextran 70-Hypromellose 0.1-0.3 % SOLN, Place 1 drop into both eyes 4 (four) times daily., Disp: , Rfl:  .  diphenoxylate-atropine (LOMOTIL) 2.5-0.025 MG tablet, TAKE 1 TABLET BY MOUTH 4 TIMES DAILY AS NEEDED FOR DIARRHEA OR  LOOSE  STOOLS (Patient taking differently: Take 1 tablet by mouth 4  (four) times daily as needed for diarrhea or loose stools.), Disp: 30 tablet, Rfl: 0 .  DULoxetine (CYMBALTA) 30 MG capsule,  Take 3 capsules (90 mg total) by mouth daily., Disp: 90 capsule, Rfl: 1 .  estradiol (ESTRACE) 2 MG tablet, Take 1 tablet (2 mg total) by mouth daily., Disp: 90 tablet, Rfl: 1 .  hydrALAZINE (APRESOLINE) 50 MG tablet, Take 1.5 tablets (75 mg total) by mouth 3 (three) times daily., Disp: 405 tablet, Rfl: 3 .  HYDROmorphone (DILAUDID) 4 MG tablet, Take 4 mg by mouth 4 (four) times daily as needed for moderate pain. , Disp: , Rfl:  .  levETIRAcetam (KEPPRA) 500 MG tablet, Take 1 tablet (500 mg total) by mouth 2 (two) times daily., Disp: 60 tablet, Rfl: 0 .  lipase/protease/amylase (CREON) 36000 UNITS CPEP capsule, Take 1 capsule (36,000 Units total) by mouth 3 (three) times daily as needed (with meals for digestion)., Disp: 180 capsule, Rfl: 0 .  loperamide (IMODIUM) 2 MG capsule, Take 2 mg by mouth as needed for diarrhea or loose stools., Disp: , Rfl:  .  methadone (DOLOPHINE) 5 MG tablet, Take 1 tablet (5 mg total) by mouth 5 (five) times daily., Disp: 30 tablet, Rfl: 0 .  metoprolol tartrate (LOPRESSOR) 100 MG tablet, Take 1 tablet (100 mg total) by mouth 2 (two) times daily., Disp: 90 tablet, Rfl: 1 .  Multiple Vitamins-Minerals (MULTIVITAMIN ADULT PO), Take 1 tablet by mouth daily., Disp: , Rfl:  .  NARCAN 4 MG/0.1ML LIQD nasal spray kit, Place 1 spray into the nose once. , Disp: , Rfl:  .  PRESCRIPTION MEDICATION, Inject 1 each into the vein daily. Home TPN . [facility] . 1 bag for 12 hours. [id number], Disp: , Rfl:  .  Probiotic Product (PROBIOTIC-10 PO), Take 1 capsule by mouth daily. , Disp: , Rfl:  .  promethazine (PHENERGAN) 25 MG tablet, Take 1 tablet (25 mg total) by mouth every 6 (six) hours as needed for nausea or vomiting., Disp: 90 tablet, Rfl: 0 .  sodium chloride 0.9 % infusion, Inject into the vein., Disp: , Rfl:  .  sucralfate  (CARAFATE) 1 GM/10ML suspension, Take 10 mLs (1 g total) by mouth 4 (four) times daily -  with meals and at bedtime., Disp: 420 mL, Rfl: 2 .  Trace Minerals Cu-Mn-Se-Zn (TRALEMENT IV), Inject 1 mL into the vein. Used in hydration bag 4 times weekly, Disp: , Rfl:  .  Vancomycin HCl (VANCOMYCIN 2.5 MG/ML + HEPARIN 2500 UNITS/ML), 2.5 mLs by Intracatheter route daily for 11 days. For  Lumen #1. Lock solution should dwell for at least 2 hours, Disp: 27.5 mL, Rfl: 0 .  Vancomycin HCl (VANCOMYCIN 2.5 MG/ML + HEPARIN 2500 UNITS/ML), 2.5 mLs by Intracatheter route daily for 11 days. For lumen #2. Lock solution should dwell for at least 2 hours, Disp: 27.5 mL, Rfl: 0 .  vitamin B-12 (CYANOCOBALAMIN) 100 MCG tablet, Take 1,000 mcg by mouth daily. , Disp: , Rfl:    Review of Systems  Constitutional: Positive for fatigue. Negative for activity change, appetite change, chills, diaphoresis, fever and unexpected weight change.  HENT: Negative for congestion, rhinorrhea, sinus pressure, sneezing, sore throat and trouble swallowing.   Eyes: Negative for photophobia and visual disturbance.  Respiratory: Negative for cough, chest tightness, shortness of breath, wheezing and stridor.   Cardiovascular: Negative for chest pain, palpitations and leg swelling.  Gastrointestinal: Negative for abdominal distention, abdominal pain, anal bleeding, blood in stool, constipation, diarrhea, nausea and vomiting.  Genitourinary: Negative for difficulty urinating, dysuria, flank pain and hematuria.  Musculoskeletal: Negative for arthralgias, back pain,  gait problem, joint swelling and myalgias.  Skin: Negative for color change, pallor, rash and wound.  Neurological: Negative for dizziness, tremors, weakness and light-headedness.  Hematological: Negative for adenopathy. Does not bruise/bleed easily.  Psychiatric/Behavioral: Negative for agitation, behavioral problems, confusion, decreased concentration, dysphoric mood and sleep  disturbance.       Objective:   Physical Exam Constitutional:      General: She is not in acute distress.    Appearance: She is well-developed and well-nourished. She is not diaphoretic.  HENT:     Head: Normocephalic and atraumatic.     Mouth/Throat:     Pharynx: No oropharyngeal exudate.  Eyes:     General: No scleral icterus.       Right eye: No discharge.        Left eye: No discharge.     Extraocular Movements: EOM normal.     Conjunctiva/sclera: Conjunctivae normal.  Cardiovascular:     Rate and Rhythm: Normal rate and regular rhythm.  Pulmonary:     Effort: Pulmonary effort is normal. No respiratory distress.     Breath sounds: No wheezing.  Abdominal:     General: There is no distension.  Musculoskeletal:        General: No tenderness or edema.     Cervical back: Normal range of motion and neck supple.  Skin:    General: Skin is warm and dry.     Coloration: Skin is not pale.     Findings: No erythema or rash.  Neurological:     Mental Status: She is alert and oriented to person, place, and time.     Motor: No abnormal muscle tone.     Coordination: Coordination normal.  Psychiatric:        Mood and Affect: Mood and affect normal.        Behavior: Behavior normal.        Thought Content: Thought content normal.        Judgment: Judgment normal.           Assessment & Plan:  Recurrent central line infections:  Her present coagulase-negative staphylococcal bacteremia is the least variant of the one she has had.  We should build to treat through the line.  I know we will invariably see her again with yet another line infection.  I do not know how are going to develop prevent the some happening in the future other than her not having a central line.  Opiate seeking behavior see notes from the hospitalist service.  ?Munchausens: has been a concern of mine but this is unproven

## 2020-12-28 ENCOUNTER — Ambulatory Visit (INDEPENDENT_AMBULATORY_CARE_PROVIDER_SITE_OTHER): Payer: Medicare Other | Admitting: Internal Medicine

## 2020-12-28 ENCOUNTER — Encounter: Payer: Self-pay | Admitting: Internal Medicine

## 2020-12-28 ENCOUNTER — Telehealth: Payer: Self-pay

## 2020-12-28 ENCOUNTER — Other Ambulatory Visit: Payer: Self-pay

## 2020-12-28 VITALS — BP 110/70 | HR 70 | Temp 98.2°F | Wt 143.7 lb

## 2020-12-28 DIAGNOSIS — G894 Chronic pain syndrome: Secondary | ICD-10-CM | POA: Diagnosis not present

## 2020-12-28 DIAGNOSIS — R7881 Bacteremia: Secondary | ICD-10-CM

## 2020-12-28 DIAGNOSIS — R569 Unspecified convulsions: Secondary | ICD-10-CM | POA: Diagnosis not present

## 2020-12-28 DIAGNOSIS — U071 COVID-19: Secondary | ICD-10-CM

## 2020-12-28 DIAGNOSIS — Z09 Encounter for follow-up examination after completed treatment for conditions other than malignant neoplasm: Secondary | ICD-10-CM

## 2020-12-28 DIAGNOSIS — K50819 Crohn's disease of both small and large intestine with unspecified complications: Secondary | ICD-10-CM | POA: Diagnosis not present

## 2020-12-28 DIAGNOSIS — K219 Gastro-esophageal reflux disease without esophagitis: Secondary | ICD-10-CM

## 2020-12-28 MED ORDER — DIPHENOXYLATE-ATROPINE 2.5-0.025 MG PO TABS: ORAL_TABLET | ORAL | 1 refills | Status: DC

## 2020-12-28 MED ORDER — DEXLANSOPRAZOLE 60 MG PO CPDR: 60.0000 mg | DELAYED_RELEASE_CAPSULE | Freq: Every day | ORAL | 1 refills | Status: DC

## 2020-12-28 NOTE — Patient Instructions (Signed)
-  Nice seeing you today!!  -Schedule follow up in 3 months.

## 2020-12-28 NOTE — Progress Notes (Signed)
Established Patient Office Visit     This visit occurred during the SARS-CoV-2 public health emergency.  Safety protocols were in place, including screening questions prior to the visit, additional usage of staff PPE, and extensive cleaning of exam room while observing appropriate contact time as indicated for disinfecting solutions.    CC/Reason for Visit: Hospital follow-up  HPI: Caitlin Peters is a 60 y.o. female who is coming in today for the above mentioned reasons.  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  She is followed by GI at [facility].  She has a history of many hospitalizations for recurrent line infections.  Since I last saw her she has had 3 hospitalizations:  1.  11/06/2020-11/10/2020 for Acinetobacter bacteremia.  At that time she presented with fever and was sent home on a course of meropenem.  2.  12/05/2020-12/08/2020.  This hospitalization was for body aches and fevers, she was ultimately found to have COVID-19.  She received 3 doses of remdesivir and was discharged home.  3.  12/15/2020-12/21/2020.  She was hospitalized for a witnessed seizure event.  She was started on Keppra.  She was also found to have a staph epidermidis methicillin-resistant bacteremia.  ID recommendations were for daptomycin and vancomycin locking.  This antibiotic course ends on 2/6.  She tells me that she has not yet seen the neurologist following this hospitalization, no appointments have been made.  She saw Dr. Tommy Medal on 1/31.  She tells me she has been feeling well other than a little sleepy which she attributes to her COVID-19.  I note in her chart from hospital notes and from ID notes, that there is some concern for narcotic seeking behavior as well as Munchhausen syndrome although none of this has been proven.  She has never asked me for narcotics in the past.     Past Medical/Surgical History: Past Medical  History:  Diagnosis Date  . Acute pancreatitis 04/13/2020  . Anasarca 10/2019  . AVN (avascular necrosis of bone) ([place])   . Cataract   . Choledocholithiasis (sludge) s/p ERCP 10/2019 10/21/2020  . Chronic pain syndrome   . CKD (chronic kidney disease), stage III ([place])   . Crohn disease ([place])   . Crohn's disease of small & large intestine with SGS 1984   Caitlin Peters is a 60year old female with a history of Crohn's disease diagnosed in 38 (age 83), history of long-term steroid use with osteoporosis, S/P multiple bowel resections ([id number]) complicated by chronic back and abdominal pain, steatorrhea and short bowel syndrome. The patient has been left with ~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 attached to proximal transverse colon through rectum. She has been  . Depression   . Diverticulosis   . GERD (gastroesophageal reflux disease)   . HTN (hypertension)   . IDA (iron deficiency anemia)   . Malnutrition ([place])   . Mass in chest   . Osteoporosis   . Pancreatitis   . 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; 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; jejunal to transverse colon anastomosis Treated at [place] SB lengthening to 165cm Dr Alene Mires, [facility]  ESTABLISHED AT [facility]  . Vitamin B12 deficiency     Past Surgical History:  Procedure Laterality Date  . ABDOMINAL ADHESION SURGERY  01/22/2018  . APPENDECTOMY  1989  . BILIARY DILATION  11/26/2019  Procedure: BILIARY DILATION;  Surgeon: Jackquline Denmark, MD;  Location: [facility];  Service: Endoscopy;;  . BILIARY DILATION  03/08/2020   Procedure: BILIARY DILATION;  Surgeon: Irving Copas., MD;  Location: [place];  Service: Gastroenterology;;  . BIOPSY  03/08/2020   Procedure: BIOPSY;  Surgeon: Irving Copas., MD;  Location: [place];  Service: Gastroenterology;;  . CHEST WALL RESECTION     right thoracotomy,resection of chest mass with anterior  rib and reconstruction using prosthetic mesh and video arthroscopy  . CHOLECYSTECTOMY  01/22/2018  . COLONOSCOPY  2019  . ENTEROSTOMY CLOSURE  04/1999  . ERCP N/A 11/26/2019   Procedure: ENDOSCOPIC RETROGRADE CHOLANGIOPANCREATOGRAPHY (ERCP);  Surgeon: Jackquline Denmark, MD;  Location: [facility];  Service: Endoscopy;  Laterality: N/A;  . ERCP N/A 03/08/2020   Procedure: ENDOSCOPIC RETROGRADE CHOLANGIOPANCREATOGRAPHY (ERCP);  Surgeon: Irving Copas., MD;  Location: [place];  Service: Gastroenterology;  Laterality: N/A;  . ESOPHAGOGASTRODUODENOSCOPY N/A 03/08/2020   Procedure: ESOPHAGOGASTRODUODENOSCOPY (EGD);  Surgeon: Irving Copas., MD;  Location: [place];  Service: Gastroenterology;  Laterality: N/A;  . EUS N/A 03/08/2020   Procedure: UPPER ENDOSCOPIC ULTRASOUND (EUS) LINEAR;  Surgeon: Irving Copas., MD;  Location: [place];  Service: Gastroenterology;  Laterality: N/A;  . ILEOCECETOMY  03/1999   ileocolon resection with abdominal stoma  . ILEOSTOMY CLOSURE  2001  . IR FLUORO GUIDE CV LINE LEFT  01/07/2020  . IR FLUORO GUIDE CV LINE LEFT  03/09/2020  . IR FLUORO GUIDE CV LINE LEFT  05/09/2020  . IR FLUORO GUIDE CV LINE LEFT  07/20/2020  . IR FLUORO GUIDE CV LINE RIGHT  08/05/2020  . IR PTA VENOUS EXCEPT DIALYSIS CIRCUIT  01/07/2020  . IR REMOVAL TUN CV CATH W/O FL  08/05/2020  . IR US GUIDE VASC ACCESS LEFT     x 2 06/17/19 and 09/14/2019  . IR US GUIDE VASC ACCESS RIGHT  08/05/2020  . KNEE SURGERY     right knee   . LAPAROSCOPIC SMALL BOWEL RESECTION  2009   2000-2009.  SB resections for Crohns Disease - now with Short gut  . OMENTECTOMY  01/22/2018  . PARTIAL HYSTERECTOMY  1984   with LSO  . REMOVAL OF STONES  11/26/2019   Procedure: REMOVAL OF STONES;  Surgeon: Jackquline Denmark, MD;  Location: [facility];  Service: Endoscopy;;  . REMOVAL OF STONES  03/08/2020   Procedure: REMOVAL OF STONES;  Surgeon: Irving Copas., MD;  Location: [place];  Service: Gastroenterology;;  . SALPINGOOPHORECTOMY Left 1984  . SALPINGOOPHORECTOMY Right 1990  . SERIAL TRANSVERSE ENTEROPLASTY (STEP) - SMALL BOWEL LENGTHENING  01/22/2018   Dr Alene Mires, [place] length from 120 to 165cm   . SMALL INTESTINE SURGERY  2002  . SMALL INTESTINE SURGERY  2003  . SPHINCTEROTOMY  11/26/2019   Procedure: SPHINCTEROTOMY;  Surgeon: Jackquline Denmark, MD;  Location: [facility];  Service: Endoscopy;;  . TOTAL ABDOMINAL HYSTERECTOMY  1990   with RSO  . UPPER GASTROINTESTINAL ENDOSCOPY      Social History:  reports that she has quit smoking. She has never used smokeless tobacco. She reports previous alcohol use. She reports that she does not use drugs.  Allergies: Allergies  Allergen Reactions  . Meperidine Hives    Other reaction(s): GI Upset Due to [place]   . Hyoscyamine Hives and Swelling    Legs swelling   Disorientation  . Cefepime Other (See Comments)    Neurotoxicity occurring in setting of AKI.  Ceftriaxone tolerated during same admit  . Gabapentin Other (See Comments)    unknown  . Lyrica [Pregabalin] Other (See Comments)    unknown  . Topamax [Topiramate] Other (See Comments)    unknown  . Zosyn [Piperacillin Sod-Tazobactam So]     Patient reports it makes her vomit, her neck stiff, and her "heart feel funny"  . Fentanyl Rash    Pt is allergic to fentanyl patch related to the glue (gives her a rash) Pt states she is NOT allergic to fentanyl IV medicine  . Morphine And Related Rash    Family History:  Family History  Problem Relation Age of Onset  . Breast cancer Sister   . Multiple sclerosis Sister   . Diabetes Sister   . Lupus Sister   . Colon cancer Other   . Crohn's disease Other   . Seizures Mother   . Glaucoma Mother   . CAD Father   . Heart disease Father   . Hypertension Father      Current Outpatient Medications:  .  acetaminophen (TYLENOL) 325 MG tablet, Take 650 mg by mouth every 6 (six)  hours as needed for mild pain. , Disp: , Rfl:  .  amLODipine (NORVASC) 10 MG tablet, Take 1 tablet (10 mg total) by mouth daily., Disp: 90 tablet, Rfl: 1 .  budesonide (ENTOCORT EC) 3 MG 24 hr capsule, TAKE 3 CAPSULES BY MOUTH ONCE DAILY (Patient taking differently: Take 9 mg by mouth daily.), Disp: 90 capsule, Rfl: 0 .  Calcium 200 MG TABS, Take 200 mg by mouth daily., Disp: , Rfl:  .  cholecalciferol (VITAMIN D3) 25 MCG (1000 UT) tablet, Take 1,000 Units by mouth daily. , Disp: , Rfl:  .  cycloSPORINE (RESTASIS) 0.05 % ophthalmic emulsion, Place 1 drop into both eyes 2 (two) times daily., Disp: , Rfl:  .  daptomycin (CUBICIN) IVPB, Inject 500 mg into the vein daily for 11 days. Indication:  MRSE bacteremia w/ femoral cath  First Dose: Yes Last Day of Therapy:  01/01/21 Labs - Once weekly:  CBC/D, BMP, and CPK Labs - Every other week:  ESR and CRP Method of administration: IV Push Method of administration may be changed at the discretion of home infusion pharmacist based upon assessment of the patient and/or caregiver's ability to self-administer the medication ordered., Disp: 11 Units, Rfl: 0 .  Dextran 70-Hypromellose 0.1-0.3 % SOLN, Place 1 drop into both eyes 4 (four) times daily., Disp: , Rfl:  .  DULoxetine (CYMBALTA) 30 MG capsule, Take 3 capsules (90 mg total) by mouth daily., Disp: 90 capsule, Rfl: 1 .  estradiol (ESTRACE) 2 MG tablet, Take 1 tablet (2 mg total) by mouth daily., Disp: 90 tablet, Rfl: 1 .  hydrALAZINE (APRESOLINE) 50 MG tablet, Take 1.5 tablets (75 mg total) by mouth 3 (three) times daily., Disp: 405 tablet, Rfl: 3 .  HYDROmorphone (DILAUDID) 4 MG tablet, Take 4 mg by mouth 4 (four) times daily as needed for moderate pain. , Disp: , Rfl:  .  levETIRAcetam (KEPPRA) 500 MG tablet, Take 1 tablet (500 mg total) by mouth 2 (two) times daily., Disp: 60 tablet, Rfl: 0 .  lipase/protease/amylase (CREON) 36000 UNITS CPEP capsule, Take 1 capsule (36,000 Units total) by mouth 3 (three)  times daily as needed (with meals for digestion)., Disp: 180 capsule, Rfl: 0 .  loperamide (IMODIUM) 2 MG capsule, Take 2 mg by mouth as needed for diarrhea or loose stools., Disp: , Rfl:  .  methadone (  DOLOPHINE) 5 MG tablet, Take 1 tablet (5 mg total) by mouth 5 (five) times daily., Disp: 30 tablet, Rfl: 0 .  metoprolol tartrate (LOPRESSOR) 100 MG tablet, Take 1 tablet (100 mg total) by mouth 2 (two) times daily., Disp: 90 tablet, Rfl: 1 .  Multiple Vitamins-Minerals (MULTIVITAMIN ADULT PO), Take 1 tablet by mouth daily., Disp: , Rfl:  .  NARCAN 4 MG/0.1ML LIQD nasal spray kit, Place 1 spray into the nose once. , Disp: , Rfl:  .  PRESCRIPTION MEDICATION, Inject 1 each into the vein daily. Home TPN . [facility] in [facility] [place] . 1 bag for 12 hours. [phone], Disp: , Rfl:  .  Probiotic Product (PROBIOTIC-10 PO), Take 1 capsule by mouth daily. , Disp: , Rfl:  .  promethazine (PHENERGAN) 25 MG tablet, Take 1 tablet (25 mg total) by mouth every 6 (six) hours as needed for nausea or vomiting., Disp: 90 tablet, Rfl: 0 .  sodium chloride 0.9 % infusion, Inject into the vein., Disp: , Rfl:  .  sucralfate (CARAFATE) 1 GM/10ML suspension, Take 10 mLs (1 g total) by mouth 4 (four) times daily -  with meals and at bedtime., Disp: 420 mL, Rfl: 2 .  Trace Minerals Cu-Mn-Se-Zn (TRALEMENT IV), Inject 1 mL into the vein. Used in hydration bag 4 times weekly, Disp: , Rfl:  .  Vancomycin HCl (VANCOMYCIN 2.5 MG/ML + HEPARIN 2500 UNITS/ML), 2.5 mLs by Intracatheter route daily for 11 days. For  Lumen #1. Lock solution should dwell for at least 2 hours, Disp: 27.5 mL, Rfl: 0 .  Vancomycin HCl (VANCOMYCIN 2.5 MG/ML + HEPARIN 2500 UNITS/ML), 2.5 mLs by Intracatheter route daily for 11 days. For lumen #2. Lock solution should dwell for at least 2 hours, Disp: 27.5 mL, Rfl: 0 .  vitamin B-12 (CYANOCOBALAMIN) 100 MCG tablet, Take 1,000 mcg by mouth daily. , Disp: , Rfl:  .  dexlansoprazole (DEXILANT) 60 MG  capsule, Take 1 capsule (60 mg total) by mouth daily., Disp: 90 capsule, Rfl: 1 .  diphenoxylate-atropine (LOMOTIL) 2.5-0.025 MG tablet, TAKE 1 TABLET BY MOUTH 4 TIMES DAILY AS NEEDED FOR DIARRHEA OR  LOOSE  STOOLS, Disp: 90 tablet, Rfl: 1  Review of Systems:  Constitutional: Denies fever, chills, diaphoresis, appetite change and fatigue.  HEENT: Denies photophobia, eye pain, redness, hearing loss, ear pain, congestion, sore throat, rhinorrhea, sneezing, mouth sores, trouble swallowing, neck pain, neck stiffness and tinnitus.   Respiratory: Denies SOB, DOE, cough, chest tightness,  and wheezing.   Cardiovascular: Denies chest pain, palpitations and leg swelling.  Gastrointestinal: Denies nausea, vomiting, abdominal pain, diarrhea, constipation, blood in stool and abdominal distention.  Genitourinary: Denies dysuria, urgency, frequency, hematuria, flank pain and difficulty urinating.  Endocrine: Denies: hot or cold intolerance, sweats, changes in hair or nails, polyuria, polydipsia. Musculoskeletal: Denies myalgias, back pain, joint swelling, arthralgias and gait problem.  Skin: Denies pallor, rash and wound.  Neurological: Denies dizziness, seizures, syncope, weakness, light-headedness, numbness and headaches.  Hematological: Denies adenopathy. Easy bruising, personal or family bleeding history  Psychiatric/Behavioral: Denies suicidal ideation, mood changes, confusion, nervousness, sleep disturbance and agitation    Physical Exam: Vitals:   12/28/20 1049  BP: 110/70  Pulse: 70  Temp: 98.2 F (36.8 C)  TempSrc: Oral  SpO2: 99%  Weight: 143 lb 11.2 oz (65.2 kg)    Body mass index is 21.85 kg/m.  Constitutional: NAD, calm, comfortable Eyes: PERRL, lids and conjunctivae normal, wears corrective lenses ENMT: Mucous membranes are moist.  Respiratory: clear to auscultation bilaterally, no wheezing, no crackles. Normal respiratory effort. No accessory muscle use.  Cardiovascular:  Regular rate and rhythm, no murmurs / rubs / gallops. No extremity edema.   Neurologic: Grossly intact and nonfocal. Psychiatric: Normal judgment and insight. Alert and oriented x 3. Normal mood.    Impression and Plan:  Hospital discharge follow-up  Crohn's disease of small and large intestines with complication ([name])  Chronic pain syndrome  Seizures ([place])   COVID-19 Bacteremia Gastroesophageal reflux disease without esophagitis   -She is requesting refills of Dexilant and Lomotil today. -I will arrange for her to see neurology as she is newly started on Keppra, I wonder if some of her somnolence may be attributed to this. -Her antibiotic course is scheduled to complete on 2/6.  She has already seen infectious diseases. -She has had no further seizure activity since her hospital discharge. -Follow-up with me in 3 months.   Patient Instructions  -Nice seeing you today!!  -Schedule follow up in 3 months.     [name], MD [facility] at [facility]

## 2020-12-28 NOTE — Telephone Encounter (Signed)
Attempted to call the patient x2 but phone number provided stated "can not complete as dialed"

## 2020-12-28 NOTE — Telephone Encounter (Signed)
-----   Message from Werner Lean, MD sent at 12/28/2020 10:54 AM EST ----- Results: No AF frequent PACs Plan: At next visit will discuss transition of labetalol from metoprolol  Werner Lean, MD

## 2021-01-04 ENCOUNTER — Encounter: Payer: Self-pay | Admitting: Neurology

## 2021-01-04 ENCOUNTER — Ambulatory Visit (INDEPENDENT_AMBULATORY_CARE_PROVIDER_SITE_OTHER): Payer: Medicare Other | Admitting: Internal Medicine

## 2021-01-04 ENCOUNTER — Other Ambulatory Visit: Payer: Self-pay

## 2021-01-04 ENCOUNTER — Encounter: Payer: Self-pay | Admitting: Internal Medicine

## 2021-01-04 ENCOUNTER — Other Ambulatory Visit: Payer: Self-pay | Admitting: Internal Medicine

## 2021-01-04 VITALS — BP 140/80 | Temp 98.1°F | Ht 67.0 in | Wt 138.8 lb

## 2021-01-04 DIAGNOSIS — Z13820 Encounter for screening for osteoporosis: Secondary | ICD-10-CM

## 2021-01-04 DIAGNOSIS — K912 Postsurgical malabsorption, not elsewhere classified: Secondary | ICD-10-CM

## 2021-01-04 DIAGNOSIS — E43 Unspecified severe protein-calorie malnutrition: Secondary | ICD-10-CM

## 2021-01-04 DIAGNOSIS — R569 Unspecified convulsions: Secondary | ICD-10-CM | POA: Diagnosis not present

## 2021-01-04 DIAGNOSIS — Z780 Asymptomatic menopausal state: Secondary | ICD-10-CM

## 2021-01-04 DIAGNOSIS — Z0000 Encounter for general adult medical examination without abnormal findings: Secondary | ICD-10-CM

## 2021-01-04 DIAGNOSIS — D649 Anemia, unspecified: Secondary | ICD-10-CM

## 2021-01-04 DIAGNOSIS — N1832 Chronic kidney disease, stage 3b: Secondary | ICD-10-CM | POA: Diagnosis not present

## 2021-01-04 DIAGNOSIS — K50819 Crohn's disease of both small and large intestine with unspecified complications: Secondary | ICD-10-CM

## 2021-01-04 MED ORDER — PROMETHAZINE HCL 25 MG PO TABS: 25.0000 mg | ORAL_TABLET | Freq: Four times a day (QID) | ORAL | 0 refills | Status: DC | PRN

## 2021-01-04 NOTE — Progress Notes (Signed)
Established Patient Office Visit     This visit occurred during the SARS-CoV-2 public health emergency.  Safety protocols were in place, including screening questions prior to the visit, additional usage of staff PPE, and extensive cleaning of exam room while observing appropriate contact time as indicated for disinfecting solutions.    CC/Reason for Visit: Subsequent Medicare wellness visit  HPI: Caitlin Peters is a 60 y.o. female who is coming in today for the above mentioned reasons. 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  She has had multiple hospitalizations for line infections.  She follows with infectious diseases.  She completed her most recent course of antibiotics on February 6.  She receives TPN through her femoral line.She has chronic pain due to all her abdominal surgeries and is on Dilaudid through a pain clinic.  Also has a history of hypertension, GERD, depression and vitamin B-12 deficiency.  She has no acute complaints today.  She is due for Tdap and shingles vaccines but otherwise immunizations are up-to-date.  She has routine eye care but no dental care.  She had a visit with her GI at [facility] this week.  She tells me that her hemoglobin was low at 7.7, her Entocort dose was decreased to every other day.   Past Medical/Surgical History: Past Medical History:  Diagnosis Date  . Acute pancreatitis 04/13/2020  . Anasarca 10/2019  . AVN (avascular necrosis of bone) ([place])   . Cataract   . Choledocholithiasis (sludge) s/p ERCP 10/2019 10/21/2020  . Chronic pain syndrome   . CKD (chronic kidney disease), stage III ([place])   . Crohn disease ([place])   . Crohn's disease of small & large intestine with SGS 1984   Caitlin Peters is a 60year old female with a history of Crohn's disease diagnosed in 26 (age 56), history of long-term steroid use with osteoporosis, S/P multiple bowel resections  ([id number]) complicated by chronic back and abdominal pain, steatorrhea and short bowel syndrome. The patient has been left with ~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 attached to proximal transverse colon through rectum. She has been  . Depression   . Diverticulosis   . GERD (gastroesophageal reflux disease)   . HTN (hypertension)   . IDA (iron deficiency anemia)   . Malnutrition ([place])   . Mass in chest   . Osteoporosis   . Pancreatitis   . 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; 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; jejunal to transverse colon anastomosis Treated at [place] SB lengthening to 165cm Dr Alene Mires, [facility]  ESTABLISHED AT [facility]  . Vitamin B12 deficiency     Past Surgical History:  Procedure Laterality Date  . ABDOMINAL ADHESION SURGERY  01/22/2018  . APPENDECTOMY  1989  . BILIARY DILATION  11/26/2019   Procedure: BILIARY DILATION;  Surgeon: Jackquline Denmark, MD;  Location: [facility];  Service: Endoscopy;;  . BILIARY DILATION  03/08/2020   Procedure: BILIARY DILATION;  Surgeon: Irving Copas., MD;  Location: [place];  Service: Gastroenterology;;  . BIOPSY  03/08/2020   Procedure: BIOPSY;  Surgeon: Irving Copas., MD;  Location: [place];  Service: Gastroenterology;;  . CHEST WALL RESECTION     right thoracotomy,resection of chest mass with anterior rib and reconstruction using prosthetic mesh and video arthroscopy  . CHOLECYSTECTOMY  01/22/2018  . COLONOSCOPY  2019  .  ENTEROSTOMY CLOSURE  04/1999  . ERCP N/A 11/26/2019   Procedure: ENDOSCOPIC RETROGRADE CHOLANGIOPANCREATOGRAPHY (ERCP);  Surgeon: Jackquline Denmark, MD;  Location: [facility];  Service: Endoscopy;  Laterality: N/A;  . ERCP N/A 03/08/2020   Procedure: ENDOSCOPIC RETROGRADE CHOLANGIOPANCREATOGRAPHY (ERCP);  Surgeon: Irving Copas., MD;  Location: [place];  Service: Gastroenterology;  Laterality: N/A;  .  ESOPHAGOGASTRODUODENOSCOPY N/A 03/08/2020   Procedure: ESOPHAGOGASTRODUODENOSCOPY (EGD);  Surgeon: Irving Copas., MD;  Location: [place];  Service: Gastroenterology;  Laterality: N/A;  . EUS N/A 03/08/2020   Procedure: UPPER ENDOSCOPIC ULTRASOUND (EUS) LINEAR;  Surgeon: Irving Copas., MD;  Location: [place];  Service: Gastroenterology;  Laterality: N/A;  . ILEOCECETOMY  03/1999   ileocolon resection with abdominal stoma  . ILEOSTOMY CLOSURE  2001  . IR FLUORO GUIDE CV LINE LEFT  01/07/2020  . IR FLUORO GUIDE CV LINE LEFT  03/09/2020  . IR FLUORO GUIDE CV LINE LEFT  05/09/2020  . IR FLUORO GUIDE CV LINE LEFT  07/20/2020  . IR FLUORO GUIDE CV LINE RIGHT  08/05/2020  . IR PTA VENOUS EXCEPT DIALYSIS CIRCUIT  01/07/2020  . IR REMOVAL TUN CV CATH W/O FL  08/05/2020  . IR US GUIDE VASC ACCESS LEFT     x 2 06/17/19 and 09/14/2019  . IR US GUIDE VASC ACCESS RIGHT  08/05/2020  . KNEE SURGERY     right knee   . LAPAROSCOPIC SMALL BOWEL RESECTION  2009   2000-2009.  SB resections for Crohns Disease - now with Short gut  . OMENTECTOMY  01/22/2018  . PARTIAL HYSTERECTOMY  1984   with LSO  . REMOVAL OF STONES  11/26/2019   Procedure: REMOVAL OF STONES;  Surgeon: Jackquline Denmark, MD;  Location: [facility];  Service: Endoscopy;;  . REMOVAL OF STONES  03/08/2020   Procedure: REMOVAL OF STONES;  Surgeon: Irving Copas., MD;  Location: [place];  Service: Gastroenterology;;  . SALPINGOOPHORECTOMY Left 1984  . SALPINGOOPHORECTOMY Right 1990  . SERIAL TRANSVERSE ENTEROPLASTY (STEP) - SMALL BOWEL LENGTHENING  01/22/2018   Dr Alene Mires, [place] length from 120 to 165cm   . SMALL INTESTINE SURGERY  2002  . SMALL INTESTINE SURGERY  2003  . SPHINCTEROTOMY  11/26/2019   Procedure: SPHINCTEROTOMY;  Surgeon: Jackquline Denmark, MD;  Location: [facility];  Service: Endoscopy;;  . TOTAL ABDOMINAL HYSTERECTOMY  1990   with RSO  . UPPER GASTROINTESTINAL ENDOSCOPY       Social History:  reports that she has quit smoking. She has never used smokeless tobacco. She reports previous alcohol use. She reports that she does not use drugs.  Allergies: Allergies  Allergen Reactions  . Meperidine Hives    Other reaction(s): GI Upset Due to [place]   . Hyoscyamine Hives and Swelling    Legs swelling   Disorientation  . Cefepime Other (See Comments)    Neurotoxicity occurring in setting of AKI. Ceftriaxone tolerated during same admit  . Gabapentin Other (See Comments)    unknown  . Lyrica [Pregabalin] Other (See Comments)    unknown  . Topamax [Topiramate] Other (See Comments)    unknown  . Zosyn [Piperacillin Sod-Tazobactam So]     Patient reports it makes her vomit, her neck stiff, and her "heart feel funny"  . Fentanyl Rash    Pt is allergic to fentanyl patch related to the glue (gives her a rash) Pt states she is NOT allergic to fentanyl IV medicine  . Morphine And Related Rash  Family History:  Family History  Problem Relation Age of Onset  . Breast cancer Sister   . Multiple sclerosis Sister   . Diabetes Sister   . Lupus Sister   . Colon cancer Other   . Crohn's disease Other   . Seizures Mother   . Glaucoma Mother   . CAD Father   . Heart disease Father   . Hypertension Father      Current Outpatient Medications:  .  acetaminophen (TYLENOL) 325 MG tablet, Take 650 mg by mouth every 6 (six) hours as needed for mild pain. , Disp: , Rfl:  .  amLODipine (NORVASC) 10 MG tablet, Take 1 tablet (10 mg total) by mouth daily., Disp: 90 tablet, Rfl: 1 .  budesonide (ENTOCORT EC) 3 MG 24 hr capsule, TAKE 3 CAPSULES BY MOUTH ONCE DAILY (Patient taking differently: Take 9 mg by mouth every other day.), Disp: 90 capsule, Rfl: 0 .  Calcium 200 MG TABS, Take 200 mg by mouth daily., Disp: , Rfl:  .  cholecalciferol (VITAMIN D3) 25 MCG (1000 UT) tablet, Take 1,000 Units by mouth daily. , Disp: , Rfl:  .  cycloSPORINE (RESTASIS) 0.05 %  ophthalmic emulsion, Place 1 drop into both eyes 2 (two) times daily., Disp: , Rfl:  .  dexlansoprazole (DEXILANT) 60 MG capsule, Take 1 capsule (60 mg total) by mouth daily., Disp: 90 capsule, Rfl: 1 .  Dextran 70-Hypromellose 0.1-0.3 % SOLN, Place 1 drop into both eyes 4 (four) times daily., Disp: , Rfl:  .  diphenoxylate-atropine (LOMOTIL) 2.5-0.025 MG tablet, TAKE 1 TABLET BY MOUTH 4 TIMES DAILY AS NEEDED FOR DIARRHEA OR  LOOSE  STOOLS, Disp: 90 tablet, Rfl: 1 .  DULoxetine (CYMBALTA) 30 MG capsule, Take 3 capsules (90 mg total) by mouth daily., Disp: 90 capsule, Rfl: 1 .  estradiol (ESTRACE) 2 MG tablet, Take 1 tablet (2 mg total) by mouth daily., Disp: 90 tablet, Rfl: 1 .  GATTEX 5 MG KIT, Inject 5 mLs into the skin daily., Disp: , Rfl:  .  hydrALAZINE (APRESOLINE) 50 MG tablet, Take 1.5 tablets (75 mg total) by mouth 3 (three) times daily., Disp: 405 tablet, Rfl: 3 .  HYDROmorphone (DILAUDID) 4 MG tablet, Take 4 mg by mouth 4 (four) times daily as needed for moderate pain. , Disp: , Rfl:  .  levETIRAcetam (KEPPRA) 500 MG tablet, Take 1 tablet (500 mg total) by mouth 2 (two) times daily., Disp: 60 tablet, Rfl: 0 .  lipase/protease/amylase (CREON) 36000 UNITS CPEP capsule, Take 1 capsule (36,000 Units total) by mouth 3 (three) times daily as needed (with meals for digestion)., Disp: 180 capsule, Rfl: 0 .  loperamide (IMODIUM) 2 MG capsule, Take 2 mg by mouth as needed for diarrhea or loose stools., Disp: , Rfl:  .  methadone (DOLOPHINE) 5 MG tablet, Take 1 tablet (5 mg total) by mouth 5 (five) times daily., Disp: 30 tablet, Rfl: 0 .  metoprolol tartrate (LOPRESSOR) 100 MG tablet, Take 1 tablet (100 mg total) by mouth 2 (two) times daily., Disp: 90 tablet, Rfl: 1 .  Multiple Vitamins-Minerals (MULTIVITAMIN ADULT PO), Take 1 tablet by mouth daily., Disp: , Rfl:  .  NARCAN 4 MG/0.1ML LIQD nasal spray kit, Place 1 spray into the nose once. , Disp: , Rfl:  .  PRESCRIPTION MEDICATION, Inject 1 each  into the vein daily. Home TPN . [facility] . 1 bag for 12 hours. [phone], Disp: , Rfl:  .  Probiotic  Product (PROBIOTIC-10 PO), Take 1 capsule by mouth daily. , Disp: , Rfl:  .  promethazine (PHENERGAN) 25 MG tablet, Take 1 tablet (25 mg total) by mouth every 6 (six) hours as needed for nausea or vomiting., Disp: 90 tablet, Rfl: 0 .  sodium chloride 0.9 % infusion, Inject into the vein., Disp: , Rfl:  .  sucralfate (CARAFATE) 1 GM/10ML suspension, Take 10 mLs (1 g total) by mouth 4 (four) times daily -  with meals and at bedtime., Disp: 420 mL, Rfl: 2 .  Trace Minerals Cu-Mn-Se-Zn (TRALEMENT IV), Inject 1 mL into the vein. Used in hydration bag 4 times weekly, Disp: , Rfl:  .  vitamin B-12 (CYANOCOBALAMIN) 100 MCG tablet, Take 1,000 mcg by mouth daily. , Disp: , Rfl:   Review of Systems:  Constitutional: Denies fever, chills, diaphoresis, appetite change and fatigue.  HEENT: Denies photophobia, eye pain, redness, hearing loss, ear pain, congestion, sore throat, rhinorrhea, sneezing, mouth sores, trouble swallowing, neck pain, neck stiffness and tinnitus.   Respiratory: Denies SOB, DOE, cough, chest tightness,  and wheezing.   Cardiovascular: Denies chest pain, palpitations and leg swelling.  Gastrointestinal: Denies nausea, vomiting, abdominal pain, diarrhea, constipation, blood in stool and abdominal distention.  Genitourinary: Denies dysuria, urgency, frequency, hematuria, flank pain and difficulty urinating.  Endocrine: Denies: hot or cold intolerance, sweats, changes in hair or nails, polyuria, polydipsia. Musculoskeletal: Denies myalgias, back pain, joint swelling, arthralgias and gait problem.  Skin: Denies pallor, rash and wound.  Neurological: Denies dizziness, seizures, syncope, weakness, light-headedness, numbness and headaches.  Hematological: Denies adenopathy. Easy bruising, personal or family bleeding history  Psychiatric/Behavioral: Denies suicidal  ideation, mood changes, confusion, nervousness, sleep disturbance and agitation    Physical Exam: Vitals:   01/04/21 1344  BP: 140/80  Temp: 98.1 F (36.7 C)  TempSrc: Oral  Weight: 138 lb 12.8 oz (63 kg)  Height: 5' 7"  (1.702 m)    Body mass index is 21.74 kg/m.   Constitutional: NAD, calm, comfortable Eyes: PERRL, lids and conjunctivae normal ENMT: Mucous membranes are moist. Posterior pharynx clear of any exudate or lesions. Normal dentition. Tympanic membrane is pearly white, no erythema or bulging. Neck: normal, supple, no masses, no thyromegaly Respiratory: clear to auscultation bilaterally, no wheezing, no crackles. Normal respiratory effort. No accessory muscle use.  Cardiovascular: Regular rate and rhythm, no murmurs / rubs / gallops. No extremity edema. 2+ pedal pulses.  Abdomen: no tenderness, no masses palpated. No hepatosplenomegaly. Bowel sounds positive.  Musculoskeletal: no clubbing / cyanosis. No joint deformity upper and lower extremities. Good ROM, no contractures. Normal muscle tone.  Skin: no rashes, lesions, ulcers. No induration Neurologic: CN 2-12 grossly intact. Sensation intact, DTR normal. Strength 5/5 in all 4.  Psychiatric: Normal judgment and insight. Alert and oriented x 3. Normal mood.    Subsequent Medicare wellness visit   1. Risk factors, based on past  M,S,F -cardiovascular disease risk factors include age, history of hypertension.   2.  Physical activities: She walks on a daily basis   3.  Depression/mood:  Stable, not depressed   4.  Hearing:  No perceived issues   5.  ADL's: Independent in all ADLs   6.  Fall risk:  Low fall risk   7.  Home safety: No problems identified   8.  Height weight, and visual acuity: height and weight as above, vision:   Visual Acuity Screening   Right eye Left eye Both eyes  Without correction: 20/32 20/32 20/25   With  correction:        9.  Counseling:  Advise Tdap and shingles vaccines at  pharmacy   10. Lab orders based on risk factors: Laboratory update will be reviewed   11. Referral :  None today   12. Care plan:  Follow-up with me in 6 months   13. Cognitive assessment:  No cognitive impairment   14. Screening: Patient provided with a written and personalized 5-10 year screening schedule in the AVS.   yes   15. Provider List Update:   PCP, ID Dr. Tommy Medal, gastroenterology Dr. Domenica Fail  16. Advance Directives: Full code   [place] Office Visit from 01/04/2021 in [facility] at [place]  PHQ-9 Total Score 3      Fall Risk  01/04/2021 12/26/2020 11/14/2020 07/06/2020 10/16/2019  Falls in the past year? 0 0 1 1 1   Number falls in past yr: 0 0 1 0 1  Injury with Fall? 0 0 1 1 0     Impression and Plan:  Encounter for preventive health examination -She has routine eye care, have advised routine dental care. -She will get Tdap and shingles at drugstore, otherwise immunizations are up-to-date including influenza and COVID. -Screening labs today. -Healthy lifestyle discussed in detail. -She had a colonoscopy in 2020. -She had a negative mammogram in March 2021, she declines cervical cancer screening. -We will order DEXA scan today for osteoporosis screening as she has been exposed to multiple courses of steroids to help  Anemia, unspecified type  - Plan: CBC with Differential/Platelet, Anemia panel  Seizures ([place]) -On Keppra.  Has follow-up with neurology in April following her hospitalization.  Stage 3b chronic kidney disease ([place]) -Baseline creatinine is 1.050.  Severe protein-calorie malnutrition ([place])  Short gut syndrome  Crohn's disease of small and large intestines with complication ([place])  -Followed by specialty GI, on chronic TPN.   Patient Instructions   -Nice seeing you today!!  -Lab work today; will notify you once results are available.  -Remember you shingles and tetanus vaccine at your pharmacy.  -Schedule follow up in 6  months or sooner as needed.   Preventive Care 34-59 Years Old, Female Preventive care refers to lifestyle choices and visits with your health care provider that can promote health and wellness. This includes:  A yearly physical exam. This is also called an annual wellness visit.  Regular dental and eye exams.  Immunizations.  Screening for certain conditions.  Healthy lifestyle choices, such as: ? Eating a healthy diet. ? Getting regular exercise. ? Not using drugs or products that contain nicotine and tobacco. ? Limiting alcohol use. What can I expect for my preventive care visit? Physical exam Your health care provider will check your:  Height and weight. These may be used to calculate your BMI (body mass index). BMI is a measurement that tells if you are at a healthy weight.  Heart rate and blood pressure.  Body temperature.  Skin for abnormal spots. Counseling Your health care provider may ask you questions about your:  Past medical problems.  Family's medical history.  Alcohol, tobacco, and drug use.  Emotional well-being.  Home life and relationship well-being.  Sexual activity.  Diet, exercise, and sleep habits.  Work and work Statistician.  Access to firearms.  Method of birth control.  Menstrual cycle.  Pregnancy history. What immunizations do I need? Vaccines are usually given at various ages, according to a schedule. Your health care provider will recommend vaccines for you based on your  age, medical history, and lifestyle or other factors, such as travel or where you work.   What tests do I need? Blood tests  Lipid and cholesterol levels. These may be checked every 5 years, or more often if you are over 25 years old.  Hepatitis C test.  Hepatitis B test. Screening  Lung cancer screening. You may have this screening every year starting at age 17 if you have a 30-pack-year history of smoking and currently smoke or have quit within the past  15 years.  Colorectal cancer screening. ? All adults should have this screening starting at age 101 and continuing until age 67. ? Your health care provider may recommend screening at age 61 if you are at increased risk. ? You will have tests every 1-10 years, depending on your results and the type of screening test.  Diabetes screening. ? This is done by checking your blood sugar (glucose) after you have not eaten for a while (fasting). ? You may have this done every 1-3 years.  Mammogram. ? This may be done every 1-2 years. ? Talk with your health care provider about when you should start having regular mammograms. This may depend on whether you have a family history of breast cancer.  BRCA-related cancer screening. This may be done if you have a family history of breast, ovarian, tubal, or peritoneal cancers.  Pelvic exam and Pap test. ? This may be done every 3 years starting at age 39. ? Starting at age 35, this may be done every 5 years if you have a Pap test in combination with an HPV test. Other tests  STD (sexually transmitted disease) testing, if you are at risk.  Bone density scan. This is done to screen for osteoporosis. You may have this scan if you are at high risk for osteoporosis. Talk with your health care provider about your test results, treatment options, and if necessary, the need for more tests. Follow these instructions at home: Eating and drinking  Eat a diet that includes fresh fruits and vegetables, whole grains, lean protein, and low-fat dairy products.  Take vitamin and mineral supplements as recommended by your health care provider.  Do not drink alcohol if: ? Your health care provider tells you not to drink. ? You are pregnant, may be pregnant, or are planning to become pregnant.  If you drink alcohol: ? Limit how much you have to 0-1 drink a day. ? Be aware of how much alcohol is in your drink. In the [place] one drink equals one 12 oz bottle of beer  (355 mL), one 5 oz glass of wine (148 mL), or one 1 oz glass of hard liquor (44 mL).   Lifestyle  Take daily care of your teeth and gums. Brush your teeth every morning and night with fluoride toothpaste. Floss one time each day.  Stay active. Exercise for at least 30 minutes 5 or more days each week.  Do not use any products that contain nicotine or tobacco, such as cigarettes, e-cigarettes, and chewing tobacco. If you need help quitting, ask your health care provider.  Do not use drugs.  If you are sexually active, practice safe sex. Use a condom or other form of protection to prevent STIs (sexually transmitted infections).  If you do not wish to become pregnant, use a form of birth control. If you plan to become pregnant, see your health care provider for a prepregnancy visit.  If told by your health care provider, take low-dose  aspirin daily starting at age 79.  Find healthy ways to cope with stress, such as: ? Meditation, yoga, or listening to music. ? Journaling. ? Talking to a trusted person. ? Spending time with friends and family. Safety  Always wear your seat belt while driving or riding in a vehicle.  Do not drive: ? If you have been drinking alcohol. Do not ride with someone who has been drinking. ? When you are tired or distracted. ? While texting.  Wear a helmet and other protective equipment during sports activities.  If you have firearms in your house, make sure you follow all gun safety procedures. What's next?  Visit your health care provider once a year for an annual wellness visit.  Ask your health care provider how often you should have your eyes and teeth checked.  Stay up to date on all vaccines. This information is not intended to replace advice given to you by your health care provider. Make sure you discuss any questions you have with your health care provider. Document Revised: 08/16/2020 Document Reviewed: 07/24/2018 Elsevier Patient Education   2021 [place], MD [facility]

## 2021-01-04 NOTE — Patient Instructions (Signed)
-Nice seeing you today!!  -Lab work today; will notify you once results are available.  -Remember you shingles and tetanus vaccine at your pharmacy.  -Schedule follow up in 6 months or sooner as needed.   Preventive Care 60-60 Years Old, Female Preventive care refers to lifestyle choices and visits with your health care provider that can promote health and wellness. This includes:  A yearly physical exam. This is also called an annual wellness visit.  Regular dental and eye exams.  Immunizations.  Screening for certain conditions.  Healthy lifestyle choices, such as: ? Eating a healthy diet. ? Getting regular exercise. ? Not using drugs or products that contain nicotine and tobacco. ? Limiting alcohol use. What can I expect for my preventive care visit? Physical exam Your health care provider will check your:  Height and weight. These may be used to calculate your BMI (body mass index). BMI is a measurement that tells if you are at a healthy weight.  Heart rate and blood pressure.  Body temperature.  Skin for abnormal spots. Counseling Your health care provider may ask you questions about your:  Past medical problems.  Family's medical history.  Alcohol, tobacco, and drug use.  Emotional well-being.  Home life and relationship well-being.  Sexual activity.  Diet, exercise, and sleep habits.  Work and work Statistician.  Access to firearms.  Method of birth control.  Menstrual cycle.  Pregnancy history. What immunizations do I need? Vaccines are usually given at various ages, according to a schedule. Your health care provider will recommend vaccines for you based on your age, medical history, and lifestyle or other factors, such as travel or where you work.   What tests do I need? Blood tests  Lipid and cholesterol levels. These may be checked every 5 years, or more often if you are over 60 years old.  Hepatitis C test.  Hepatitis B  test. Screening  Lung cancer screening. You may have this screening every year starting at age 60 if you have a 30-pack-year history of smoking and currently smoke or have quit within the past 15 years.  Colorectal cancer screening. ? All adults should have this screening starting at age 60 and continuing until age 63. ? Your health care provider may recommend screening at age 60 if you are at increased risk. ? You will have tests every 1-10 years, depending on your results and the type of screening test.  Diabetes screening. ? This is done by checking your blood sugar (glucose) after you have not eaten for a while (fasting). ? You may have this done every 1-3 years.  Mammogram. ? This may be done every 1-2 years. ? Talk with your health care provider about when you should start having regular mammograms. This may depend on whether you have a family history of breast cancer.  BRCA-related cancer screening. This may be done if you have a family history of breast, ovarian, tubal, or peritoneal cancers.  Pelvic exam and Pap test. ? This may be done every 3 years starting at age 60. ? Starting at age 60, this may be done every 5 years if you have a Pap test in combination with an HPV test. Other tests  STD (sexually transmitted disease) testing, if you are at risk.  Bone density scan. This is done to screen for osteoporosis. You may have this scan if you are at high risk for osteoporosis. Talk with your health care provider about your test results, treatment options, and if  necessary, the need for more tests. Follow these instructions at home: Eating and drinking  Eat a diet that includes fresh fruits and vegetables, whole grains, lean protein, and low-fat dairy products.  Take vitamin and mineral supplements as recommended by your health care provider.  Do not drink alcohol if: ? Your health care provider tells you not to drink. ? You are pregnant, may be pregnant, or are planning  to become pregnant.  If you drink alcohol: ? Limit how much you have to 0-1 drink a day. ? Be aware of how much alcohol is in your drink. In the [place] one drink equals one 12 oz bottle of beer (355 mL), one 5 oz glass of wine (148 mL), or one 1 oz glass of hard liquor (44 mL).   Lifestyle  Take daily care of your teeth and gums. Brush your teeth every morning and night with fluoride toothpaste. Floss one time each day.  Stay active. Exercise for at least 30 minutes 5 or more days each week.  Do not use any products that contain nicotine or tobacco, such as cigarettes, e-cigarettes, and chewing tobacco. If you need help quitting, ask your health care provider.  Do not use drugs.  If you are sexually active, practice safe sex. Use a condom or other form of protection to prevent STIs (sexually transmitted infections).  If you do not wish to become pregnant, use a form of birth control. If you plan to become pregnant, see your health care provider for a prepregnancy visit.  If told by your health care provider, take low-dose aspirin daily starting at age 60.  Find healthy ways to cope with stress, such as: ? Meditation, yoga, or listening to music. ? Journaling. ? Talking to a trusted person. ? Spending time with friends and family. Safety  Always wear your seat belt while driving or riding in a vehicle.  Do not drive: ? If you have been drinking alcohol. Do not ride with someone who has been drinking. ? When you are tired or distracted. ? While texting.  Wear a helmet and other protective equipment during sports activities.  If you have firearms in your house, make sure you follow all gun safety procedures. What's next?  Visit your health care provider once a year for an annual wellness visit.  Ask your health care provider how often you should have your eyes and teeth checked.  Stay up to date on all vaccines. This information is not intended to replace advice given to you  by your health care provider. Make sure you discuss any questions you have with your health care provider. Document Revised: 08/16/2020 Document Reviewed: 07/24/2018 Elsevier Patient Education  2021 [employer].

## 2021-01-04 NOTE — Telephone Encounter (Signed)
Patient is requesting a refill on her phenergan.  Please advise.

## 2021-01-09 ENCOUNTER — Inpatient Hospital Stay: Admission: RE | Admit: 2021-01-09 | Payer: Medicare Other | Source: Ambulatory Visit

## 2021-01-12 ENCOUNTER — Other Ambulatory Visit: Payer: Self-pay

## 2021-01-12 ENCOUNTER — Ambulatory Visit (INDEPENDENT_AMBULATORY_CARE_PROVIDER_SITE_OTHER)
Admission: RE | Admit: 2021-01-12 | Discharge: 2021-01-12 | Disposition: A | Discharge status: Home / Self Care | Payer: Medicare Other | Source: Ambulatory Visit | Attending: Internal Medicine | Admitting: Internal Medicine

## 2021-01-12 DIAGNOSIS — Z13820 Encounter for screening for osteoporosis: Secondary | ICD-10-CM

## 2021-01-12 DIAGNOSIS — Z780 Asymptomatic menopausal state: Secondary | ICD-10-CM | POA: Diagnosis not present

## 2021-01-19 ENCOUNTER — Other Ambulatory Visit: Payer: Self-pay | Admitting: Internal Medicine

## 2021-01-20 ENCOUNTER — Encounter: Payer: Self-pay | Admitting: Internal Medicine

## 2021-01-20 ENCOUNTER — Telehealth (INDEPENDENT_AMBULATORY_CARE_PROVIDER_SITE_OTHER): Payer: Medicare Other | Admitting: Internal Medicine

## 2021-01-20 VITALS — Temp 98.7°F | Wt 138.0 lb

## 2021-01-20 DIAGNOSIS — R569 Unspecified convulsions: Secondary | ICD-10-CM | POA: Diagnosis not present

## 2021-01-20 DIAGNOSIS — K912 Postsurgical malabsorption, not elsewhere classified: Secondary | ICD-10-CM | POA: Diagnosis not present

## 2021-01-20 DIAGNOSIS — M816 Localized osteoporosis [Lequesne]: Secondary | ICD-10-CM | POA: Diagnosis not present

## 2021-01-20 MED ORDER — LOPERAMIDE HCL 2 MG PO CAPS: ORAL_CAPSULE | ORAL | 2 refills | Status: DC

## 2021-01-20 MED ORDER — LEVETIRACETAM 500 MG PO TABS: 500.0000 mg | ORAL_TABLET | Freq: Two times a day (BID) | ORAL | 2 refills | Status: DC

## 2021-01-20 NOTE — Progress Notes (Signed)
Virtual Visit via Telephone Note  I connected with [name] on 01/20/21 at  3:30 PM EST by telephone and verified that I am speaking with the correct person using two identifiers.   I discussed the limitations, risks, security and privacy concerns of performing an evaluation and management service by telephone and the availability of in person appointments. I also discussed with the patient that there may be a patient responsible charge related to this service. The patient expressed understanding and agreed to proceed.  Location patient: home Location provider: work office Participants present for the call: patient, provider Patient did not have a visit in the prior 7 days to address this/these issue(s).   History of Present Illness:  We have scheduled this visit to discuss her recent DEXA scan results.  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  She is in agreement to starting treatment for it.  She also needs refills of her Keppra and loperamide.   Observations/Objective: Patient sounds cheerful and well on the phone. I do not appreciate any increased work of breathing. Speech and thought processing are grossly intact. Patient reported vitals: None reported   Current Outpatient Medications:  .  acetaminophen (TYLENOL) 325 MG tablet, Take 650 mg by mouth every 6 (six) hours as needed for mild pain. , Disp: , Rfl:  .  budesonide (ENTOCORT EC) 3 MG 24 hr capsule, TAKE 3 CAPSULES BY MOUTH ONCE DAILY (Patient taking differently: Take 9 mg by mouth every other day.), Disp: 90 capsule, Rfl: 0 .  Calcium 200 MG TABS, Take 200 mg by mouth daily., Disp: , Rfl:  .  cholecalciferol (VITAMIN D3) 25 MCG (1000 UT) tablet, Take 1,000 Units by mouth daily. , Disp: , Rfl:  .  cycloSPORINE (RESTASIS) 0.05 % ophthalmic emulsion, Place 1 drop into both eyes 2 (two) times daily., Disp: , Rfl:  .  dexlansoprazole (DEXILANT) 60 MG  capsule, Take 1 capsule (60 mg total) by mouth daily., Disp: 90 capsule, Rfl: 1 .  Dextran 70-Hypromellose 0.1-0.3 % SOLN, Place 1 drop into both eyes 4 (four) times daily., Disp: , Rfl:  .  diphenoxylate-atropine (LOMOTIL) 2.5-0.025 MG tablet, TAKE 1 TABLET BY MOUTH 4 TIMES DAILY AS NEEDED FOR DIARRHEA OR  LOOSE  STOOLS, Disp: 90 tablet, Rfl: 1 .  DULoxetine (CYMBALTA) 30 MG capsule, Take 3 capsules (90 mg total) by mouth daily., Disp: 90 capsule, Rfl: 1 .  estradiol (ESTRACE) 2 MG tablet, Take 1 tablet (2 mg total) by mouth daily., Disp: 90 tablet, Rfl: 1 .  GATTEX 5 MG KIT, Inject 5 mLs into the skin daily., Disp: , Rfl:  .  hydrALAZINE (APRESOLINE) 50 MG tablet, Take 1.5 tablets (75 mg total) by mouth 3 (three) times daily., Disp: 405 tablet, Rfl: 3 .  HYDROmorphone (DILAUDID) 4 MG tablet, Take 4 mg by mouth 4 (four) times daily as needed for moderate pain. , Disp: , Rfl:  .  lipase/protease/amylase (CREON) 36000 UNITS CPEP capsule, Take 1 capsule (36,000 Units total) by mouth 3 (three) times daily as needed (with meals for digestion)., Disp: 180 capsule, Rfl: 0 .  methadone (DOLOPHINE) 5 MG tablet, Take 1 tablet (5 mg total) by mouth 5 (five) times daily., Disp: 30 tablet, Rfl: 0 .  metoprolol tartrate (LOPRESSOR) 100 MG tablet, Take 1 tablet (100 mg total) by mouth 2 (two) times daily., Disp: 90 tablet, Rfl: 1 .  Multiple  Vitamins-Minerals (MULTIVITAMIN ADULT PO), Take 1 tablet by mouth daily., Disp: , Rfl:  .  NARCAN 4 MG/0.1ML LIQD nasal spray kit, Place 1 spray into the nose once. , Disp: , Rfl:  .  PRESCRIPTION MEDICATION, Inject 1 each into the vein daily. Home TPN . [facility]  . 1 bag for 12 hours. [phone], Disp: , Rfl:  .  Probiotic Product (PROBIOTIC-10 PO), Take 1 capsule by mouth daily. , Disp: , Rfl:  .  promethazine (PHENERGAN) 25 MG tablet, Take 1 tablet (25 mg total) by mouth every 6 (six) hours as needed for nausea or vomiting., Disp: 90 tablet,  Rfl: 0 .  sodium chloride 0.9 % infusion, Inject into the vein., Disp: , Rfl:  .  sucralfate (CARAFATE) 1 GM/10ML suspension, Take 10 mLs (1 g total) by mouth 4 (four) times daily -  with meals and at bedtime., Disp: 420 mL, Rfl: 2 .  Trace Minerals Cu-Mn-Se-Zn (TRALEMENT IV), Inject 1 mL into the vein. Used in hydration bag 4 times weekly, Disp: , Rfl:  .  vitamin B-12 (CYANOCOBALAMIN) 100 MCG tablet, Take 1,000 mcg by mouth daily. , Disp: , Rfl:  .  amLODipine (NORVASC) 10 MG tablet, Take 1 tablet (10 mg total) by mouth daily., Disp: 90 tablet, Rfl: 1 .  levETIRAcetam (KEPPRA) 500 MG tablet, Take 1 tablet (500 mg total) by mouth 2 (two) times daily., Disp: 60 tablet, Rfl: 2 .  loperamide (IMODIUM) 2 MG capsule, TAKE 1 CAPSULE BY MOUTH AS NEEDED FOR DIARRHEA OR LOOSE STOOLS, Disp: 60 capsule, Rfl: 2  Review of Systems:  Constitutional: Denies fever, chills, diaphoresis, appetite change and fatigue.  HEENT: Denies photophobia, eye pain, redness, hearing loss, ear pain, congestion, sore throat, rhinorrhea, sneezing, mouth sores, trouble swallowing, neck pain, neck stiffness and tinnitus.   Respiratory: Denies SOB, DOE, cough, chest tightness,  and wheezing.   Cardiovascular: Denies chest pain, palpitations and leg swelling.  Gastrointestinal: Denies nausea, vomiting, abdominal pain, diarrhea, constipation, blood in stool and abdominal distention.  Genitourinary: Denies dysuria, urgency, frequency, hematuria, flank pain and difficulty urinating.  Endocrine: Denies: hot or cold intolerance, sweats, changes in hair or nails, polyuria, polydipsia. Musculoskeletal: Denies myalgias, back pain, joint swelling, arthralgias and gait problem.  Skin: Denies pallor, rash and wound.  Neurological: Denies dizziness, seizures, syncope, weakness, light-headedness, numbness and headaches.  Hematological: Denies adenopathy. Easy bruising, personal or family bleeding history  Psychiatric/Behavioral: Denies  suicidal ideation, mood changes, confusion, nervousness, sleep disturbance and agitation   Assessment and Plan:  Seizure ([place])  - Plan: levETIRAcetam (KEPPRA) 500 MG tablet -No recent seizure activity.  Localized osteoporosis without current pathological fracture -We discussed benefit of starting treatment with bisphosphonate therapy, she agrees. -Given her history of Crohn's disease with multiple small bowel resections with resultant short gut syndrome and reliant on TPN, I believe we should go straight to the IV bisphosphonates. -She will be scheduled to receive Prolia.  Short gut syndrome  - Plan: loperamide (IMODIUM) 2 MG capsule    I discussed the assessment and treatment plan with the patient. The patient was provided an opportunity to ask questions and all were answered. The patient agreed with the plan and demonstrated an understanding of the instructions.   The patient was advised to call back or seek an in-person evaluation if the symptoms worsen or if the condition fails to improve as anticipated.  I provided 13 minutes of non-face-to-face time during this encounter.   [name],  MD  Primary Care at [facility]

## 2021-01-30 ENCOUNTER — Telehealth: Payer: Medicare Other

## 2021-01-30 NOTE — Progress Notes (Deleted)
Chronic Care Management Pharmacy Note  01/30/2021 Name:  Caitlin Peters MRN:  [id number] DOB:  1961/01/04  Subjective: Caitlin Peters is an 60 y.o. year old female who is a primary patient of [name], [name], MD.  The CCM team was consulted for assistance with disease management and care coordination needs.    Engaged with patient by telephone for follow up visit in response to provider referral for pharmacy case management and/or care coordination services.   Consent to Services:  The patient was given information about Chronic Care Management services, agreed to services, and gave verbal consent prior to initiation of services.  Please see initial visit note for detailed documentation.   Patient Care Team: [name], [name], MD as PCP - General (Internal Medicine) Viona Gilmore, [facility] as Pharmacist (Pharmacist) Brayton Layman, MD as Referring Physician (Gastroenterology) Ted Mcalpine, MD as Consulting Physician (Internal Medicine)  Recent office visits: 01/20/21 Domingo Mend, MD: Patient presented for video visit to discuss bone density results. Plan to start Prolia injections.  01/04/21 Domingo Mend, MD: Patient presented for annual exam. Lab orders placed. No changes made.  12/28/20 Domingo Mend, MD: Patient presented for hospitalization follow up. Placed referral for neurology.   10/05/20 [name], MD: Patient presented for hospital follow up for admission from 10/6 through 09/12/2020 initially for another stenotrophomonas line infection. 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  07/08/20 [name], MD: Patient presented for hospital follow up. Patient has completed Abx course and is administering TPN every evening.   Recent consult visits: 01/03/21 Brayton Layman, MD (gastro): Patient presented for follow up for Crohn's disease. Decreased budesonide to 1 tablet every other day.   12/26/20 Alcide Evener, MD (ID):  Patient presented for central venous line infection follow up.   11/22/20 Rudean Haskell, MD (cardiology): Patient presented for palpitations. Increased hydralazine to 75 mg TID.  11/14/21 Alcide Evener, MD (ID): Patient presented for central venous line infection follow up.   Hospital visits: 1/20-1/26/22 Patient was admitted for a seizure and was started on Keppra. She was also prescribed daptomycin and vancomycin for staph epidermidis methicillin-resistant bacteremia.  1/10-1/13/22 Patient was admitted for COVID and received 3 doses of remdesivir.  12/12-12/16/21 Patient was admitted for acinetobacter bacteremia.  11/26-11/27/21 Patient admitted for small bowel obstruction.  10/6-10/18/21 Patient admitted for stenotrophomonas line infection.  08/30/20 Patient presented to ED with fever and possible infection.  08/02/20-08/12/20 Patient admitted for sepsis.  Objective:  Lab Results  Component Value Date   CREATININE 1.05 (H) 12/21/2020   BUN 28 (H) 12/21/2020   GFR 28.52 (L) 05/24/2020   GFRNONAA >60 12/21/2020   GFRAA 55 (L) 08/12/2020   NA 138 12/21/2020   K 4.1 12/21/2020   CALCIUM 9.2 12/21/2020   CO2 29 12/21/2020    Lab Results  Component Value Date/Time   HGBA1C 4.7 (L) 10/28/2019 10:23 PM   GFR 28.52 (L) 05/24/2020 04:10 PM   GFR 41.16 (L) 10/16/2019 03:02 PM    Last diabetic Eye exam: No results found for: HMDIABEYEEXA  Last diabetic Foot exam: No results found for: HMDIABFOOTEX   Lab Results  Component Value Date   TRIG 51 12/19/2020    Hepatic Function Latest Ref Rng & Units 12/21/2020 12/20/2020 12/19/2020  Total Protein 6.5 - 8.1 g/dL 6.1(L) 6.1(L) 6.0(L)  Albumin 3.5 - 5.0 g/dL 2.7(L) 2.7(L) 2.6(L)  AST 15 - 41 U/L 12(L) 11(L) 11(L)  ALT 0 - 44 U/L 12 12 13  Alk Phosphatase 38 - 126 U/L 123 120 117  Total Bilirubin 0.3 - 1.2 mg/dL 0.6 0.4 0.4  Bilirubin, Direct 0.0 - 0.2 mg/dL - - -    Lab Results  Component Value Date/Time   TSH 0.362  12/15/2020 01:06 AM   TSH 1.070 08/10/2020 05:55 AM   TSH 0.54 05/24/2020 04:10 PM   TSH 0.54 10/16/2019 03:02 PM    CBC Latest Ref Rng & Units 12/20/2020 12/19/2020 12/18/2020  WBC 4.0 - 10.5 K/uL 3.6(L) 3.7(L) 3.9(L)  Hemoglobin 12.0 - 15.0 g/dL 7.7(L) 8.1(L) 8.0(L)  Hematocrit 36.0 - 46.0 % 22.9(L) 24.7(L) 24.2(L)  Platelets 150 - 400 K/uL 157 172 159    Lab Results  Component Value Date/Time   VD25OH 44.31 05/24/2020 04:10 PM   VD25OH 53.60 10/16/2019 03:02 PM    Clinical ASCVD: {YES/NO:21197} The ASCVD Risk score Mikey Bussing DC Jr., et al., 2013) failed to calculate for the following reasons:   Cannot find a previous HDL lab   Cannot find a previous total cholesterol lab    Depression screen [facility] 2/9 01/04/2021 12/26/2020 11/14/2020  Decreased Interest 0 0 0  Down, Depressed, Hopeless 0 0 0  PHQ - 2 Score 0 0 0  Altered sleeping 2 - -  Tired, decreased energy 1 - -  Change in appetite - - -  Feeling bad or failure about yourself  0 - -  Trouble concentrating 0 - -  Moving slowly or fidgety/restless 0 - -  Suicidal thoughts 0 - -  PHQ-9 Score 3 - -  Difficult doing work/chores - - -     ***Other: (CHADS2VASc if Afib, MMRC or CAT for COPD, ACT, DEXA)  Social History   Tobacco Use  Smoking Status Former Smoker  Smokeless Tobacco Never Used   BP Readings from Last 3 Encounters:  01/04/21 140/80  12/28/20 110/70  12/26/20 (!) 150/80   Pulse Readings from Last 3 Encounters:  12/28/20 70  12/26/20 71  12/21/20 (!) 34   Wt Readings from Last 3 Encounters:  01/20/21 138 lb (62.6 kg)  01/04/21 138 lb 12.8 oz (63 kg)  12/28/20 143 lb 11.2 oz (65.2 kg)    Assessment/Interventions: Review of patient past medical history, allergies, medications, health status, including review of consultants reports, laboratory and other test data, was performed as part of comprehensive evaluation and provision of chronic care management services.   SDOH:  (Social Determinants of Health)  assessments and interventions performed: {yes/no:20286}   CCM Care Plan  Allergies  Allergen Reactions  . Meperidine Hives    Other reaction(s): GI Upset Due to [place]   . Hyoscyamine Hives and Swelling    Legs swelling   Disorientation  . Cefepime Other (See Comments)    Neurotoxicity occurring in setting of AKI. Ceftriaxone tolerated during same admit  . Gabapentin Other (See Comments)    unknown  . Lyrica [Pregabalin] Other (See Comments)    unknown  . Topamax [Topiramate] Other (See Comments)    unknown  . Zosyn [Piperacillin Sod-Tazobactam So]     Patient reports it makes her vomit, her neck stiff, and her "heart feel funny"  . Fentanyl Rash    Pt is allergic to fentanyl patch related to the glue (gives her a rash) Pt states she is NOT allergic to fentanyl IV medicine  . Morphine And Related Rash    Medications Reviewed Today    Reviewed by [name], [name], MD (Physician) on 01/20/21 at 1546  Med List Status: <  None>  Medication Order Taking? Sig Documenting Provider Last Dose Status Informant  acetaminophen (TYLENOL) 325 MG tablet [id number] Yes Take 650 mg by mouth every 6 (six) hours as needed for mild pain.  [provider] Taking Active Multiple Informants  amLODipine (NORVASC) 10 MG tablet [id number]  Take 1 tablet (10 mg total) by mouth daily. [name], [name], MD  Expired 01/06/21 2359 Multiple Informants  budesonide (ENTOCORT EC) 3 MG 24 hr capsule [id number] Yes TAKE 3 CAPSULES BY MOUTH ONCE DAILY  Patient taking differently: Take 9 mg by mouth every other day.   [name], [name], MD Taking Active   Calcium 200 MG TABS [id number] Yes Take 200 mg by mouth daily. [provider] Taking Active Multiple Informants  cholecalciferol (VITAMIN D3) 25 MCG (1000 UT) tablet [id number] Yes Take 1,000 Units by mouth daily.  [provider] Taking Active Multiple Informants  cycloSPORINE (RESTASIS) 0.05 % ophthalmic  emulsion [id number] Yes Place 1 drop into both eyes 2 (two) times daily. [provider] Taking Active Multiple Informants  dexlansoprazole (DEXILANT) 60 MG capsule [id number] Yes Take 1 capsule (60 mg total) by mouth daily. [name], [name], MD Taking Active   Dextran 70-Hypromellose 0.1-0.3 % SOLN [id number] Yes Place 1 drop into both eyes 4 (four) times daily. [provider] Taking Active Multiple Informants  diphenoxylate-atropine (LOMOTIL) 2.5-0.025 MG tablet [id number] Yes TAKE 1 TABLET BY MOUTH 4 TIMES DAILY AS NEEDED FOR DIARRHEA OR  LOOSE  STOOLS [name], [name], MD Taking Active   DULoxetine (CYMBALTA) 30 MG capsule [id number] Yes Take 3 capsules (90 mg total) by mouth daily. [name], [name], MD Taking Active Multiple Informants  estradiol (ESTRACE) 2 MG tablet [id number] Yes Take 1 tablet (2 mg total) by mouth daily. [name], [name], MD Taking Active Multiple Informants  GATTEX 5 MG KIT [id number] Yes Inject 5 mLs into the skin daily. [provider] Taking Active   hydrALAZINE (APRESOLINE) 50 MG tablet [id number] Yes Take 1.5 tablets (75 mg total) by mouth 3 (three) times daily. Werner Lean, MD Taking Active Multiple Informants  HYDROmorphone (DILAUDID) 4 MG tablet [id number] Yes Take 4 mg by mouth 4 (four) times daily as needed for moderate pain.  [provider] Taking Active Multiple Informants           Med Note Antonieta Pert Dec 15, 2020  4:35 PM) LF on 12-13-20 # 150 DS 30  levETIRAcetam (KEPPRA) 500 MG tablet [id number]  Take 1 tablet (500 mg total) by mouth 2 (two) times daily. [name], [name], MD  Active   lipase/protease/amylase (CREON) 36000 UNITS CPEP capsule [id number] Yes Take 1 capsule (36,000 Units total) by mouth 3 (three) times daily as needed (with meals for digestion). [name], [name], MD Taking Active Multiple Informants           Med Note Vita Barley Nov 07, 2020  7:51 AM)    loperamide (IMODIUM) 2 MG capsule [id number]  TAKE 1 CAPSULE BY MOUTH AS NEEDED FOR DIARRHEA OR LOOSE STOOLS [name], [name], MD  Active   methadone (DOLOPHINE) 5 MG tablet [id number] Yes Take 1 tablet (5 mg total) by mouth 5 (five) times daily. Florencia Reasons, MD Taking Active Multiple Informants           Med Note Antonieta Pert Dec 15, 2020  4:35 PM) LF on 12-13-20 #  120 DS 30  metoprolol tartrate (LOPRESSOR) 100 MG tablet [id number] Yes Take 1 tablet (100 mg total) by mouth 2 (two) times daily. [name], [name], MD Taking Active Multiple Informants  Multiple Vitamins-Minerals (MULTIVITAMIN ADULT PO) [id number] Yes Take 1 tablet by mouth daily. [provider] Taking Active Multiple Informants  NARCAN 4 MG/0.1ML LIQD nasal spray kit [id number] Yes Place 1 spray into the nose once.  [provider] Taking Active Multiple Informants           Med Note Vita Barley Nov 07, 2020  7:57 AM)    PRESCRIPTION MEDICATION [id number] Yes Inject 1 each into the vein daily. Home TPN . [facility] . 1 bag for 12 hours. [id number] [provider] Taking Active Multiple Informants           Med Note Vita Barley Nov 07, 2020  7:52 AM)    Probiotic Product (PROBIOTIC-10 PO) [id number] Yes Take 1 capsule by mouth daily.  [provider] Taking Active Multiple Informants  promethazine (PHENERGAN) 25 MG tablet [id number] Yes Take 1 tablet (25 mg total) by mouth every 6 (six) hours as needed for nausea or vomiting. [name], [name], MD Taking Active   sodium chloride 0.9 % infusion [id number] Yes Inject into the vein. [provider] Taking Active Multiple Informants           Med Note Vita Barley Nov 07, 2020  7:53 AM)    sucralfate (CARAFATE) 1 GM/10ML suspension [id number] Yes Take 10 mLs (1 g total) by mouth 4 (four) times daily -  with meals  and at bedtime. [name], [name], MD Taking Active Multiple Informants           Med Note Vita Barley Nov 07, 2020  7:54 AM)    Trace Minerals Cu-Mn-Se-Zn [facility] IV) [id number] Yes Inject 1 mL into the vein. Used in hydration bag 4 times weekly [provider] Taking Active Multiple Informants  vitamin B-12 (CYANOCOBALAMIN) 100 MCG tablet [id number] Yes Take 1,000 mcg by mouth daily.  [provider] Taking Active Multiple Informants          Patient Active Problem List   Diagnosis Date Noted  . Pressure injury of skin 12/18/2020  . Bacteremia 12/17/2020  . Seizures ([place]) 12/17/2020  . Drug-seeking behavior 12/16/2020  . Seizure ([place]) 12/15/2020  . CKD (chronic kidney disease) stage 3, GFR 30-59 ml/min ([name]) 12/06/2020  . COVID-19 virus infection 12/06/2020  . Protein calorie malnutrition ([place]) 12/06/2020  . Palpitations 11/22/2020  . PAC (premature atrial contraction) 11/22/2020  . History of central line-associated bloodstream infection (CLABSI) 10/21/2020  . Nausea & vomiting 10/21/2020  . Choledocholithiasis (sludge) s/p ERCP 10/2019 10/21/2020  . 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 Abdominal pain 09/07/2020  . Acute metabolic encephalopathy   . Hypomagnesemia   . Partial small bowel obstruction ([place])   . Bacteremia associated with intravascular line ([place]) 08/04/2020  . Enterobacter sepsis ([place]) 07/22/2020  . Anxiety 06/03/2020  . Acute pancreatitis 04/13/2020  . Infection due to Acinetobacter species 03/10/2020  . Pancytopenia ([place]) 03/05/2020  . Central line infection   . Elevated liver enzymes 01/02/2020  . Cholangitis   . Anasarca 10/28/2019  . Acute kidney injury superimposed on chronic kidney disease ([place]) 10/28/2019  . Falls 10/28/2019  . Malnutrition ([place])   . Vitamin B12 deficiency   .  GERD (gastroesophageal reflux disease)   . Chronic pain syndrome   . Hypokalemia due to excessive gastrointestinal loss of  potassium 10/13/2019  . Fever   . Hypokalemia 10/12/2019  . Chronic diarrhea 10/12/2019  . Dysuria 10/12/2019  . Bilateral lower extremity edema 10/12/2019  . AKI (acute kidney injury) ([place]) 10/12/2019  . Fungemia 08/27/2019  . IDA (iron deficiency anemia) 11/03/2018  . Dilation of biliary tract 08/28/2018  . Severe diarrhea 03/07/2018  . LFTs abnormal 01/27/2018  . GI tract obstruction ([place]) 01/27/2018  . Gram-negative bacteremia 09/13/2017  . HTN (hypertension), benign 12/02/2016  . Intractable pain 12/02/2016  . Anemia 12/02/2016  . 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 Avascular necrosis ([place]) 06/14/2015  . Polyarthralgia 05/03/2015  . On total parenteral nutrition (TPN) 12/30/2014  . Osteoporosis 12/24/2014  . Low back pain 12/16/2014  . Vitamin D deficiency 12/16/2014  . 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 History of colonic diverticulitis 2014  . Depression 07/24/2012  . Gram-positive bacteremia 07/24/2012  . Small bowel obstruction due to adhesions ([place]) 07/24/2012  . Diarrhea 12/13/2011  . Neuralgia and neuritis 06/01/2011  . Myalgia and myositis 08/12/2003  . Crohn's disease of small & large intestine with SGS 1984    Immunization History  Administered Date(s) Administered  . Hep A / Hep B 01/25/2015  . Hepatitis A, Adult 01/19/2015  . Hepatitis B, adult 01/19/2015  . Influenza Nasal 07/28/2019  . Influenza Split 08/03/2014, 09/28/2014  . Influenza,inj,Quad PF,6+ Mos 08/19/2015, 08/02/2016, 08/19/2017, 09/02/2018, 08/27/2019, 09/19/2020  . Influenza-Unspecified 07/28/2019, 09/19/2020  . PFIZER(Purple Top)SARS-COV-2 Vaccination 02/25/2020, 03/21/2020, 09/19/2020  . Pneumococcal Conjugate-13 12/30/2014  . Pneumococcal Polysaccharide-23 07/12/2011  . Td 10/15/2010  . Zoster 04/26/2014    Conditions to be addressed/monitored:  Hypertension, GERD, Chronic Kidney Disease, Depression, Osteoporosis and chronic pain and crohn's disease  There are no care plans that  you recently modified to display for this patient.   Current Barriers:  . {pharmacybarriers:24917} . ***  Pharmacist Clinical Goal(s):  [name] Kitchen Over the next *** days, patient will {PHARMACYGOALCHOICES:24921} through collaboration with PharmD and provider.  . ***  Interventions: . 1:1 collaboration with [name], [name], MD regarding development and update of comprehensive plan of care as evidenced by provider attestation and co-signature . Inter-disciplinary care team collaboration (see longitudinal plan of care) . Comprehensive medication review performed; medication list updated in electronic medical record  Hypertension (BP goal {CHL HP UPSTREAM Pharmacist BP ranges:[phone]}) -{US controlled/uncontrolled:25276} -Current treatment:  Amlodipine 10 mg daily   Hydralazine 75 mg three times daily  Metoprolol tartrate 100 mg twice daily -Medications previously tried: ***  -Current home readings: *** -Current dietary habits: *** -Current exercise habits: *** -{ACTIONS;DENIES/REPORTS:21021675::"Denies"} hypotensive/hypertensive symptoms -Educated on {CCM BP Counseling:25124} -Counseled to monitor BP at home ***, document, and provide log at future appointments -{CCMPHARMDINTERVENTION:25122}  BP monitoring:patient does keep a log  -Discussed recommendations for moderate aerobic exercise for 150 minutes/week spread out over 5 days for heart healthy lifestyle - patient currently takes the dog outside for walks for her exercise -Diet: sometimes has appetite and sometimes she doesn't; uses TPN daily and hydration bags 4 days of the week  Depression/Anxiety (Goal: ***) -{US controlled/uncontrolled:25276} -Current treatment:  Duloxetine 30 mg 3 caps daily   Bupropion SR 100 mg 2 tablets daily  -Medications previously tried/failed: *** -PHQ9: *** -GAD7: *** -Connected with *** for mental health support -Educated on {CCM mental health  counseling:25127} -{CCMPHARMDINTERVENTION:25122}  -Recommend decreasing Bupropion to 150 mg daily (Recommended max dose for  CrCl 15-60 mL/min) - follow up?  Insomnia (Goal: ***) -{US controlled/uncontrolled:25276} -Current treatment   Trazodone 100 mg 2 tabs QHS PRN (once monthly) -Medications previously tried: ***  -{CCMPHARMDINTERVENTION:25122}  -Sleep is mostly well controlled, reports she will take trazodone only when she has not slept for multiple days in a row. When she does take it she reports feeling very sedated the next day.    Osteoporosis (Goal improve bone density and prevent fractures) -Uncontrolled -Last DEXA Scan: 01/12/21  T-Score femoral neck: -2.1, -2.5  T-Score total hip: n/a  T-Score lumbar spine: -3.3  T-Score forearm radius: n/a  10-year probability of major osteoporotic fracture: n/a  10-year probability of hip fracture: n/a -Patient is a candidate for pharmacologic treatment due to T-Score < -2.5 in femoral neck and T-Score < -2.5 in lumbar spine -Current treatment   Vitamin D3 1000 units daily   Tums (Typically takes TID)   Prolia? -Medications previously tried: Reclast (10 years) -Recommend [phone] units of vitamin D daily. Recommend 1200 mg of calcium daily from dietary and supplemental sources. Recommend weight-bearing and muscle strengthening exercises for building and maintaining bone density. -{CCMPHARMDINTERVENTION:25122}  GERD (Goal: ***) -{US controlled/uncontrolled:25276} -Current treatment   Dexlansoprazole 60 mg daily   Famotidine 20 mg at bedtime   Tums three times daily as needed  -Medications previously tried: ***  -{CCMPHARMDINTERVENTION:25122}   Crohn's disease/short bowel syndrome (Goal: ***) -{US controlled/uncontrolled:25276} -Current treatment   Budesonide 64m 3 capsules every other day  Creon 36,000 units TID PRN   Dicyclomine 20 mg TID   Lomotil 2.5-0.025 mg QID PRN   Loperamide 2 mg PRN   Probiotic 2 caps  daily   Promethazine 25 mg q6hr PRN   Sucralfate 1g/154mQID   TPN  -Medications previously tried: ***  -{CCMPHARMDINTERVENTION:25122}   Chronic pain (Goal: ***) -{US controlled/uncontrolled:25276} -Current treatment   APAP 325 mg 2 tabs q6hr PRN (headaches/ muscle pain)   Hydromorphone 4 mg q6hr PRN   Methadone 5 mg five times daily  -Medications previously tried: ***  -{CCMPHARMDINTERVENTION:25122}  Post menopausal (Goal: ***) -{US controlled/uncontrolled:25276} -Current treatment  . Estradiol 2 mg daily -Medications previously tried: ***  -{CCMPHARMDINTERVENTION:25122}   Siezures (Goal: ***) -{US controlled/uncontrolled:25276} -Current treatment  . Levetiracetam 500 mg 1 tablet twice daily -Medications previously tried: ***  -{CCMPHARMDINTERVENTION:25122}    Health Maintenance -Vaccine gaps: tetanus, shingles -Current therapy:   Cyclosporine 0.05% soln 1 drop BID   Dextran 70-hypromellose 0.1-0.3% soln 1 drop QID    Multivitamin daily (Centrum 50+)   Omega-3 1000 mg daily   Potassium Chloride 40 mEq daily   Vitamin B12 1000 mcg daily -Educated on {ccm supplement counseling:25128} -{CCM Patient satisfied:25129} -{CCMPHARMDINTERVENTION:25122}   Patient Goals/Self-Care Activities . Over the next *** days, patient will:  - {pharmacypatientgoals:24919}  Follow Up Plan: {CM FOLLOW UP PLBVQX:45038}Medication Assistance: {MEDASSISTANCEINFO:25044}  [place] [place]one: [phone]: [phone] pill box? {Yes or If no, why not?:20788} Pt endorses ***% compliance  We discussed: {Pharmacy options:24294} Patient decided to: {US Pharmacy Plan:23885}  Care Plan and Follow Up Patient Decision:  {FOLLOWUP:24991}  Plan: {CM FOLLOW UP PLAN:25073}  ***

## 2021-01-31 ENCOUNTER — Ambulatory Visit (INDEPENDENT_AMBULATORY_CARE_PROVIDER_SITE_OTHER): Payer: Medicare Other | Admitting: Pharmacist

## 2021-01-31 DIAGNOSIS — F419 Anxiety disorder, unspecified: Secondary | ICD-10-CM

## 2021-01-31 DIAGNOSIS — M816 Localized osteoporosis [Lequesne]: Secondary | ICD-10-CM | POA: Diagnosis not present

## 2021-01-31 DIAGNOSIS — R569 Unspecified convulsions: Secondary | ICD-10-CM

## 2021-01-31 NOTE — Progress Notes (Signed)
Chronic Care Management Pharmacy Note  02/08/2021 Name:  Caitlin Peters MRN:  [id number] DOB:  [birth year]  Subjective: Caitlin Peters is an 60 y.o. year old female who is a primary patient of [name], [name], MD.  The CCM team was consulted for assistance with disease management and care coordination needs.    Engaged with patient by telephone for follow up visit in response to provider referral for pharmacy case management and/or care coordination services.   Consent to Services:  The patient was given information about Chronic Care Management services, agreed to services, and gave verbal consent prior to initiation of services.  Please see initial visit note for detailed documentation.   Patient Care Team: [name], [name], MD as PCP - General (Internal Medicine) Viona Gilmore, [facility] as Pharmacist (Pharmacist) Brayton Layman, MD as Referring Physician (Gastroenterology) Ted Mcalpine, MD as Consulting Physician (Internal Medicine)  Recent office visits: 01/20/21 Domingo Mend, MD: Patient presented for video visit to discuss bone density results. Plan to start Prolia injections.  01/04/21 Domingo Mend, MD: Patient presented for annual exam. Lab orders placed. No changes made.  12/28/20 Domingo Mend, MD: Patient presented for hospitalization follow up. Placed referral for neurology.   10/05/20 [name], MD: Patient presented for hospital follow up for admission from 10/6 through 09/12/2020 initially for another stenotrophomonas line infection. 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  07/08/20 [name], MD: Patient presented for hospital follow up. Patient has completed Abx course and is administering TPN every evening.   Recent consult visits: 01/03/21 Brayton Layman, MD (gastro): Patient presented for follow up for Crohn's disease. Decreased budesonide to 1 tablet every other day.   12/26/20 Alcide Evener, MD (ID):  Patient presented for central venous line infection follow up.   11/22/20 Rudean Haskell, MD (cardiology): Patient presented for palpitations. Increased hydralazine to 75 mg TID.  11/14/21 Alcide Evener, MD (ID): Patient presented for central venous line infection follow up.   Hospital visits: 1/20-1/26/22 Patient was admitted for a seizure and was started on Keppra. She was also prescribed daptomycin and vancomycin for staph epidermidis methicillin-resistant bacteremia.  1/10-1/13/22 Patient was admitted for COVID and received 3 doses of remdesivir.  12/12-12/16/21 Patient was admitted for acinetobacter bacteremia.  11/26-11/27/21 Patient admitted for small bowel obstruction.  10/6-10/18/21 Patient admitted for stenotrophomonas line infection.  08/30/20 Patient presented to ED with fever and possible infection.  08/02/20-08/12/20 Patient admitted for sepsis.  Objective:  Lab Results  Component Value Date   CREATININE 1.05 (H) 12/21/2020   BUN 28 (H) 12/21/2020   GFR 28.52 (L) 05/24/2020   GFRNONAA >60 12/21/2020   GFRAA 55 (L) 08/12/2020   NA 138 12/21/2020   K 4.1 12/21/2020   CALCIUM 9.2 12/21/2020   CO2 29 12/21/2020    Lab Results  Component Value Date/Time   HGBA1C 4.7 (L) 10/28/2019 10:23 PM   GFR 28.52 (L) 05/24/2020 04:10 PM   GFR 41.16 (L) 10/16/2019 03:02 PM    Last diabetic Eye exam: No results found for: HMDIABEYEEXA  Last diabetic Foot exam: No results found for: HMDIABFOOTEX   Lab Results  Component Value Date   TRIG 51 12/19/2020    Hepatic Function Latest Ref Rng & Units 12/21/2020 12/20/2020 12/19/2020  Total Protein 6.5 - 8.1 g/dL 6.1(L) 6.1(L) 6.0(L)  Albumin 3.5 - 5.0 g/dL 2.7(L) 2.7(L) 2.6(L)  AST 15 - 41 U/L 12(L) 11(L) 11(L)  ALT 0 - 44 U/L 12 12 13  Alk Phosphatase 38 - 126 U/L 123 120 117  Total Bilirubin 0.3 - 1.2 mg/dL 0.6 0.4 0.4  Bilirubin, Direct 0.0 - 0.2 mg/dL - - -    Lab Results  Component Value Date/Time   TSH 0.362  12/15/2020 01:06 AM   TSH 1.070 08/10/2020 05:55 AM   TSH 0.54 05/24/2020 04:10 PM   TSH 0.54 10/16/2019 03:02 PM    CBC Latest Ref Rng & Units 12/20/2020 12/19/2020 12/18/2020  WBC 4.0 - 10.5 K/uL 3.6(L) 3.7(L) 3.9(L)  Hemoglobin 12.0 - 15.0 g/dL 7.7(L) 8.1(L) 8.0(L)  Hematocrit 36.0 - 46.0 % 22.9(L) 24.7(L) 24.2(L)  Platelets 150 - 400 K/uL 157 172 159    Lab Results  Component Value Date/Time   VD25OH 44.31 05/24/2020 04:10 PM   VD25OH 53.60 10/16/2019 03:02 PM    Clinical ASCVD: No  The ASCVD Risk score Mikey Bussing DC Jr., et al., 2013) failed to calculate for the following reasons:   Cannot find a previous HDL lab   Cannot find a previous total cholesterol lab    Depression screen [name] 2/9 01/04/2021 12/26/2020 11/14/2020  Decreased Interest 0 0 0  Down, Depressed, Hopeless 0 0 0  PHQ - 2 Score 0 0 0  Altered sleeping 2 - -  Tired, decreased energy 1 - -  Change in appetite - - -  Feeling bad or failure about yourself  0 - -  Trouble concentrating 0 - -  Moving slowly or fidgety/restless 0 - -  Suicidal thoughts 0 - -  PHQ-9 Score 3 - -  Difficult doing work/chores - - -     Social History   Tobacco Use  Smoking Status Former Smoker  Smokeless Tobacco Never Used   BP Readings from Last 3 Encounters:  01/04/21 140/80  12/28/20 110/70  12/26/20 (!) 150/80   Pulse Readings from Last 3 Encounters:  12/28/20 70  12/26/20 71  12/21/20 (!) 34   Wt Readings from Last 3 Encounters:  01/20/21 138 lb (62.6 kg)  01/04/21 138 lb 12.8 oz (63 kg)  12/28/20 143 lb 11.2 oz (65.2 kg)    Assessment/Interventions: Review of patient past medical history, allergies, medications, health status, including review of consultants reports, laboratory and other test data, was performed as part of comprehensive evaluation and provision of chronic care management services.   SDOH:  (Social Determinants of Health) assessments and interventions performed: No   CCM Care Plan  Allergies   Allergen Reactions  . Meperidine Hives    Other reaction(s): GI Upset Due to [place]   . Hyoscyamine Hives and Swelling    Legs swelling   Disorientation  . Cefepime Other (See Comments)    Neurotoxicity occurring in setting of AKI. Ceftriaxone tolerated during same admit  . Gabapentin Other (See Comments)    unknown  . Lyrica [Pregabalin] Other (See Comments)    unknown  . Topamax [Topiramate] Other (See Comments)    unknown  . Zosyn [Piperacillin Sod-Tazobactam So]     Patient reports it makes her vomit, her neck stiff, and her "heart feel funny"  . Fentanyl Rash    Pt is allergic to fentanyl patch related to the glue (gives her a rash) Pt states she is NOT allergic to fentanyl IV medicine  . Morphine And Related Rash    Medications Reviewed Today    Reviewed by [name], [name], MD (Physician) on 01/20/21 at [place] List Status: <None>  Medication Order Taking? Sig Documenting Provider Last Dose Status  Informant  acetaminophen (TYLENOL) 325 MG tablet [id number] Yes Take 650 mg by mouth every 6 (six) hours as needed for mild pain.  [provider] Taking Active Multiple Informants  amLODipine (NORVASC) 10 MG tablet [id number]  Take 1 tablet (10 mg total) by mouth daily. [name], [name], MD  Expired 01/06/21 2359 Multiple Informants  budesonide (ENTOCORT EC) 3 MG 24 hr capsule [id number] Yes TAKE 3 CAPSULES BY MOUTH ONCE DAILY  Patient taking differently: Take 9 mg by mouth every other day.   [name], [name], MD Taking Active   Calcium 200 MG TABS [id number] Yes Take 200 mg by mouth daily. [provider] Taking Active Multiple Informants  cholecalciferol (VITAMIN D3) 25 MCG (1000 UT) tablet [id number] Yes Take 1,000 Units by mouth daily.  [provider] Taking Active Multiple Informants  cycloSPORINE (RESTASIS) 0.05 % ophthalmic emulsion [id number] Yes Place 1 drop into both eyes 2 (two) times daily. [provider] Taking Active Multiple Informants  dexlansoprazole (DEXILANT) 60 MG capsule [id number] Yes Take 1 capsule (60 mg total) by mouth daily. [name], [name], MD Taking Active   Dextran 70-Hypromellose 0.1-0.3 % SOLN [id number] Yes Place 1 drop into both eyes 4 (four) times daily. [provider] Taking Active Multiple Informants  diphenoxylate-atropine (LOMOTIL) 2.5-0.025 MG tablet [id number] Yes TAKE 1 TABLET BY MOUTH 4 TIMES DAILY AS NEEDED FOR DIARRHEA OR  LOOSE  STOOLS [name], [name], MD Taking Active   DULoxetine (CYMBALTA) 30 MG capsule [id number] Yes Take 3 capsules (90 mg total) by mouth daily. [name], [name], MD Taking Active Multiple Informants  estradiol (ESTRACE) 2 MG tablet [id number] Yes Take 1 tablet (2 mg total) by mouth daily. [name], [name], MD Taking Active Multiple Informants  GATTEX 5 MG KIT [id number] Yes Inject 5 mLs into the skin daily. [provider] Taking Active   hydrALAZINE (APRESOLINE) 50 MG tablet [id number] Yes Take 1.5 tablets (75 mg total) by mouth 3 (three) times daily. Werner Lean, MD Taking Active Multiple Informants  HYDROmorphone (DILAUDID) 4 MG tablet [id number] Yes Take 4 mg by mouth 4 (four) times daily as needed for moderate pain.  [provider] Taking Active Multiple Informants           Med Note Antonieta Pert Dec 15, 2020  4:35 PM) LF on 12-13-20 # 150 DS 30  levETIRAcetam (KEPPRA) 500 MG tablet [id number]  Take 1 tablet (500 mg total) by mouth 2 (two) times daily. [name], [name], MD  Active   lipase/protease/amylase (CREON) 36000 UNITS CPEP capsule [id number] Yes Take 1 capsule (36,000 Units total) by mouth 3 (three) times daily as needed (with meals for digestion). [name], [name], MD Taking Active Multiple Informants           Med Note Vita Barley Nov 07, 2020  7:51 AM)    loperamide (IMODIUM) 2 MG capsule [id number]  TAKE 1 CAPSULE  BY MOUTH AS NEEDED FOR DIARRHEA OR LOOSE STOOLS [name], [name], MD  Active   methadone (DOLOPHINE) 5 MG tablet [id number] Yes Take 1 tablet (5 mg total) by mouth 5 (five) times daily. Florencia Reasons, MD Taking Active Multiple Informants           Med Note Antonieta Pert Dec 15, 2020  4:35 PM) LF on 12-13-20 # 120 DS 30  metoprolol tartrate (LOPRESSOR) 100 MG tablet [id number]  Yes Take 1 tablet (100 mg total) by mouth 2 (two) times daily. [name], [name], MD Taking Active Multiple Informants  Multiple Vitamins-Minerals (MULTIVITAMIN ADULT PO) [id number] Yes Take 1 tablet by mouth daily. [provider] Taking Active Multiple Informants  NARCAN 4 MG/0.1ML LIQD nasal spray kit [id number] Yes Place 1 spray into the nose once.  [provider] Taking Active Multiple Informants           Med Note Vita Barley Nov 07, 2020  7:57 AM)    PRESCRIPTION MEDICATION [id number] Yes Inject 1 each into the vein daily. Home TPN . [facility] [place] . 1 bag for 12 hours. [phone] [provider] Taking Active Multiple Informants           Med Note Vita Barley Nov 07, 2020  7:52 AM)    Probiotic Product (PROBIOTIC-10 PO) [id number] Yes Take 1 capsule by mouth daily.  [provider] Taking Active Multiple Informants  promethazine (PHENERGAN) 25 MG tablet [id number] Yes Take 1 tablet (25 mg total) by mouth every 6 (six) hours as needed for nausea or vomiting. [name], [name], MD Taking Active   sodium chloride 0.9 % infusion [id number] Yes Inject into the vein. [provider] Taking Active Multiple Informants           Med Note Vita Barley Nov 07, 2020  7:53 AM)    sucralfate (CARAFATE) 1 GM/10ML suspension [id number] Yes Take 10 mLs (1 g total) by mouth 4 (four) times daily -  with meals and at bedtime. [name], [name], MD Taking Active Multiple Informants            Med Note Vita Barley Nov 07, 2020  7:54 AM)    Trace Minerals Cu-Mn-Se-Zn [facility] IV) [id number] Yes Inject 1 mL into the vein. Used in hydration bag 4 times weekly [provider] Taking Active Multiple Informants  vitamin B-12 (CYANOCOBALAMIN) 100 MCG tablet [id number] Yes Take 1,000 mcg by mouth daily.  [provider] Taking Active Multiple Informants          Patient Active Problem List   Diagnosis Date Noted  . Pressure injury of skin 12/18/2020  . Bacteremia 12/17/2020  . Seizures ([place]) 12/17/2020  . Drug-seeking behavior 12/16/2020  . Seizure ([place]) 12/15/2020  . CKD (chronic kidney disease) stage 3, GFR 30-59 ml/min ([name]) 12/06/2020  . COVID-19 virus infection 12/06/2020  . Protein calorie malnutrition ([place]) 12/06/2020  . Palpitations 11/22/2020  . PAC (premature atrial contraction) 11/22/2020  . History of central line-associated bloodstream infection (CLABSI) 10/21/2020  . Nausea & vomiting 10/21/2020  . Choledocholithiasis (sludge) s/p ERCP 10/2019 10/21/2020  . 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 Abdominal pain 09/07/2020  . Acute metabolic encephalopathy   . Hypomagnesemia   . Partial small bowel obstruction ([place])   . Bacteremia associated with intravascular line ([place]) 08/04/2020  . Enterobacter sepsis ([place]) 07/22/2020  . Anxiety 06/03/2020  . Acute pancreatitis 04/13/2020  . Infection due to Acinetobacter species 03/10/2020  . Pancytopenia ([place]) 03/05/2020  . Central line infection   . Elevated liver enzymes 01/02/2020  . Cholangitis   . Anasarca 10/28/2019  . Acute kidney injury superimposed on chronic kidney disease ([place]) 10/28/2019  . Falls 10/28/2019  . Malnutrition ([place])   . Vitamin B12 deficiency   . GERD (gastroesophageal reflux disease)   . Chronic pain syndrome   .  Hypokalemia due to excessive gastrointestinal loss of potassium 10/13/2019  . Fever   . Hypokalemia 10/12/2019  . Chronic diarrhea 10/12/2019  .  Dysuria 10/12/2019  . Bilateral lower extremity edema 10/12/2019  . AKI (acute kidney injury) ([place]) 10/12/2019  . Fungemia 08/27/2019  . IDA (iron deficiency anemia) 11/03/2018  . Dilation of biliary tract 08/28/2018  . Severe diarrhea 03/07/2018  . LFTs abnormal 01/27/2018  . GI tract obstruction ([place]) 01/27/2018  . Gram-negative bacteremia 09/13/2017  . HTN (hypertension), benign 12/02/2016  . Intractable pain 12/02/2016  . Anemia 12/02/2016  . 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 Avascular necrosis ([place]) 06/14/2015  . Polyarthralgia 05/03/2015  . On total parenteral nutrition (TPN) 12/30/2014  . Osteoporosis 12/24/2014  . Low back pain 12/16/2014  . Vitamin D deficiency 12/16/2014  . 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 History of colonic diverticulitis 2014  . Depression 07/24/2012  . Gram-positive bacteremia 07/24/2012  . Small bowel obstruction due to adhesions ([place]) 07/24/2012  . Diarrhea 12/13/2011  . Neuralgia and neuritis 06/01/2011  . Myalgia and myositis 08/12/2003  . Crohn's disease of small & large intestine with SGS 1984    Immunization History  Administered Date(s) Administered  . Hep A / Hep B 01/25/2015  . Hepatitis A, Adult 01/19/2015  . Hepatitis B, adult 01/19/2015  . Influenza Nasal 07/28/2019  . Influenza Split 08/03/2014, 09/28/2014  . Influenza,inj,Quad PF,6+ Mos 08/19/2015, 08/02/2016, 08/19/2017, 09/02/2018, 08/27/2019, 09/19/2020  . Influenza-Unspecified 07/28/2019, 09/19/2020  . PFIZER(Purple Top)SARS-COV-2 Vaccination 02/25/2020, 03/21/2020, 09/19/2020  . Pneumococcal Conjugate-13 12/30/2014  . Pneumococcal Polysaccharide-23 07/12/2011  . Td 10/15/2010  . Zoster 04/26/2014    Conditions to be addressed/monitored:  Hypertension, GERD, Chronic Kidney Disease, Depression, Osteoporosis and chronic pain and crohn's disease  Care Plan : CCM Pharmacu Care Plan  Updates made by Viona Gilmore, [place] since 02/08/2021 12:00 AM    Problem: Problem:  Hypertension, GERD, Chronic Kidney Disease, Depression, Osteoporosis and chronic pain and crohn's disease     Long-Range Goal: Patient-Specific Goal   Start Date: 01/31/2021  Expected End Date: 01/31/2022  This Visit's Progress: On track  Priority: High  Note:   Current Barriers:  . Unable to independently monitor therapeutic efficacy  Pharmacist Clinical Goal(s):  [name] Kitchen Patient will achieve adherence to monitoring guidelines and medication adherence to achieve therapeutic efficacy through collaboration with PharmD and provider.   Interventions: . 1:1 collaboration with [name], [name], MD regarding development and update of comprehensive plan of care as evidenced by provider attestation and co-signature . Inter-disciplinary care team collaboration (see longitudinal plan of care) . Comprehensive medication review performed; medication list updated in electronic medical record  Hypertension (BP goal <140/90) -Not ideally controlled -Current treatment:  Amlodipine 10 mg daily   Hydralazine 75 mg three times daily   Metoprolol tartrate 100 mg twice daily -Medications previously tried: n/a  -Current home readings: up to 160/70-80s; does not check consistently -Current dietary habits: did not discuss -Current exercise habits: patient currently takes the dog outside for walks for her exercise -Denies hypotensive/hypertensive symptoms -Educated on Exercise goal of 150 minutes per week; Importance of home blood pressure monitoring; -Counseled to monitor BP at home weekly, document, and provide log at future appointments -Counseled on diet and exercise extensively Recommended to continue current medication  Depression/Anxiety (Goal: minimize symptoms) -Controlled -Current treatment:  Duloxetine 30 mg 3 caps daily   Bupropion SR 100 mg 2 tablets daily  -Medications previously tried/failed: n/a -PHQ9: 3 -GAD7: n/a -Educated on Benefits  of medication for symptom  control Benefits of cognitive-behavioral therapy with or without medication -Recommended dose decrease of bupropion based on kidney function. Will discuss with PCP.  Insomnia (Goal: improve quality and quantity of sleep) -Controlled -Current treatment  . Trazodone 100 mg 2 tabs as bedtime as needed -Medications previously tried: n/a  -Recommended to continue current medication  Osteoporosis (Goal improve bone density and prevent fractures) -Uncontrolled --Last DEXA Scan: 01/12/21  T-Score femoral neck: -2.1, -2.5  T-Score total hip: n/a  T-Score lumbar spine: -3.3  T-Score forearm radius: n/a  10-year probability of major osteoporotic fracture: n/a  10-year probability of hip fracture: n/a -Patient is a candidate for pharmacologic treatment due to T-Score < -2.5 in femoral neck and T-Score < -2.5 in lumbar spine -Current treatment   Vitamin D3 1000 units daily   Tums (Typically takes TID)  -Medications previously tried: Reclast (10 years) - in [place] -Recommend [phone] units of vitamin D daily. Recommend 1200 mg of calcium daily from dietary and supplemental sources. Recommend weight-bearing and muscle strengthening exercises for building and maintaining bone density. -Recommended to continue current medication Patient will follow up about getting Prolia injections.   GERD (Goal: minimize symptoms) -Controlled -Current treatment   Dexlansoprazole 60 mg daily   Famotidine 20 mg at bedtime   Tums three times daily as needed  -Medications previously tried: n/a  -Recommended to continue current medication  Crohn's disease/short bowel syndrome (Goal: minimize symptoms) -Uncontrolled -Current treatment   Budesonide 76m 3 capsules every other day  Creon 36,000 units TID PRN   Dicyclomine 20 mg TID   Lomotil 2.5-0.025 mg QID PRN   Loperamide 2 mg PRN   Probiotic 2 caps daily   Promethazine 25 mg q6hr PRN   Sucralfate 1g/150mQID   TPN  -Medications  previously tried: n/a  -Recommended to continue current medication  Chronic pain (Goal: minimize pain) -Controlled -Current treatment   APAP 325 mg 2 tabs q6hr PRN (headaches/ muscle pain)   Hydromorphone 4 mg q6hr PRN   Methadone 5 mg five times daily  -Medications previously tried: n/a  -Recommended to continue current medication Managed by pain management.  Post menopausal (Goal: minimize symptoms of menopause) -Controlled -Current treatment  . Estradiol 2 mg daily -Medications previously tried: none  -Counseled on long term risks of taking estrogen therapy such as blood clots, strokes, and cancer. Patient is willing to discuss with PCP.    Seizures (Goal: prevent seizures) -Controlled -Current treatment  . Levetiracetam 500 mg 1 tablet twice daily -Medications previously tried: none  -Recommended to continue current medication Counseled on side effects with levetiracetam.   Health Maintenance -Vaccine gaps: tetanus, shingles -Current therapy:   Cyclosporine 0.05% soln 1 drop BID   Dextran 70-hypromellose 0.1-0.3% soln 1 drop QID    Multivitamin daily (Centrum 50+)   Omega-3 1000 mg daily  Potassium Chloride 40 mEq daily   Vitamin B12 1000 mcg daily -Educated on Cost vs benefit of each product must be carefully weighed by individual consumer -Patient is satisfied with current therapy and denies issues -Recommended stopping fish oil as she may not need to take due to her current cholesterol numbers.  Patient Goals/Self-Care Activities . Patient will:  - take medications as prescribed check blood pressure weekly, document, and provide at future appointments  Follow Up Plan: Telephone follow up appointment with care management team member scheduled for: 6 months       Medication Assistance: None required.  Patient affirms current coverage meets  needs.  Patient's preferred pharmacy is:  [facility], [address] [place] [address] Phone: [phone] Fax: [phone]  Uses pill box? Yes Pt endorses 100% compliance  We discussed: Current pharmacy is preferred with insurance plan and patient is satisfied with pharmacy services Patient decided to: Continue current medication management strategy  Care Plan and Follow Up Patient Decision:  Patient agrees to Care Plan and Follow-up.  Plan: Telephone follow up appointment with care management team member scheduled for:  6 months  [name], PharmD [place] Pharmacist [place] at [place] [phone]

## 2021-02-08 ENCOUNTER — Ambulatory Visit: Payer: Medicare Other | Admitting: Infectious Disease

## 2021-02-08 NOTE — Patient Instructions (Addendum)
Hi Caitlin Peters,  It was great to get to speak with you again over the telephone! Below is a summary of some of the topics we discussed. Please make sure to keep checking your blood pressure regularly at home.  Please reach out to me if you have any questions or need anything before our follow up!  Best, Maddie  [name], PharmD, [facility]  Visit Information  Goals Addressed   None    Patient Care Plan: CCM Pharmacu Care Plan    Problem Identified: Problem: Hypertension, GERD, Chronic Kidney Disease, Depression, Osteoporosis and chronic pain and crohn's disease     Long-Range Goal: Patient-Specific Goal   Start Date: 01/31/2021  Expected End Date: 01/31/2022  This Visit's Progress: On track  Priority: High  Note:   Current Barriers:  . Unable to independently monitor therapeutic efficacy  Pharmacist Clinical Goal(s):  [name] Kitchen Patient will achieve adherence to monitoring guidelines and medication adherence to achieve therapeutic efficacy through collaboration with PharmD and provider.   Interventions: . 1:1 collaboration with [name], [name], MD regarding development and update of comprehensive plan of care as evidenced by provider attestation and co-signature . Inter-disciplinary care team collaboration (see longitudinal plan of care) . Comprehensive medication review performed; medication list updated in electronic medical record  Hypertension (BP goal <140/90) -Not ideally controlled -Current treatment:  Amlodipine 10 mg daily   Hydralazine 75 mg three times daily   Metoprolol tartrate 100 mg twice daily -Medications previously tried: n/a  -Current home readings: up to 160/70-80s; does not check consistently -Current dietary habits: did not discuss -Current exercise habits: patient currently takes the dog outside for walks for her exercise -Denies hypotensive/hypertensive symptoms -Educated on  Exercise goal of 150 minutes per week; Importance of home blood pressure monitoring; -Counseled to monitor BP at home weekly, document, and provide log at future appointments -Counseled on diet and exercise extensively Recommended to continue current medication  Depression/Anxiety (Goal: minimize symptoms) -Controlled -Current treatment:  Duloxetine 30 mg 3 caps daily   Bupropion SR 100 mg 2 tablets daily  -Medications previously tried/failed: n/a -PHQ9: 3 -GAD7: n/a -Educated on Benefits of medication for symptom control Benefits of cognitive-behavioral therapy with or without medication -Recommended dose decrease of bupropion based on kidney function. Will discuss with PCP.  Insomnia (Goal: improve quality and quantity of sleep) -Controlled -Current treatment  . Trazodone 100 mg 2 tabs as bedtime as needed -Medications previously tried: n/a  -Recommended to continue current medication  Osteoporosis (Goal improve bone density and prevent fractures) -Uncontrolled --Last DEXA Scan: 01/12/21  T-Score femoral neck: -2.1, -2.5  T-Score total hip: n/a  T-Score lumbar spine: -3.3  T-Score forearm radius: n/a  10-year probability of major osteoporotic fracture: n/a  10-year probability of hip fracture: n/a -Patient is a candidate for pharmacologic treatment due to T-Score < -2.5 in femoral neck and T-Score < -2.5 in lumbar spine -Current treatment   Vitamin D3 1000 units daily   Tums (Typically takes TID)  -Medications previously tried: Reclast (10 years) - in [place] -Recommend [id number] units of vitamin D daily. Recommend 1200 mg of calcium daily from dietary and supplemental sources. Recommend weight-bearing and muscle strengthening exercises for building and maintaining bone density. -Recommended to continue current medication Patient will follow up about getting Prolia injections.   GERD (Goal: minimize symptoms) -Controlled -Current treatment   Dexlansoprazole 60 mg  daily   Famotidine 20 mg at bedtime   Tums three times  daily as needed  -Medications previously tried: n/a  -Recommended to continue current medication  Crohn's disease/short bowel syndrome (Goal: minimize symptoms) -Uncontrolled -Current treatment   Budesonide 27m 3 capsules every other day  Creon 36,000 units TID PRN   Dicyclomine 20 mg TID   Lomotil 2.5-0.025 mg QID PRN   Loperamide 2 mg PRN   Probiotic 2 caps daily   Promethazine 25 mg q6hr PRN   Sucralfate 1g/148mQID   TPN  -Medications previously tried: n/a  -Recommended to continue current medication  Chronic pain (Goal: minimize pain) -Controlled -Current treatment   APAP 325 mg 2 tabs q6hr PRN (headaches/ muscle pain)   Hydromorphone 4 mg q6hr PRN   Methadone 5 mg five times daily  -Medications previously tried: n/a  -Recommended to continue current medication Managed by pain management.  Post menopausal (Goal: minimize symptoms of menopause) -Controlled -Current treatment  . Estradiol 2 mg daily -Medications previously tried: none  -Counseled on long term risks of taking estrogen therapy such as blood clots, strokes, and cancer. Patient is willing to discuss with PCP.    Seizures (Goal: prevent seizures) -Controlled -Current treatment  . Levetiracetam 500 mg 1 tablet twice daily -Medications previously tried: none  -Recommended to continue current medication Counseled on side effects with levetiracetam.   Health Maintenance -Vaccine gaps: tetanus, shingles -Current therapy:   Cyclosporine 0.05% soln 1 drop BID   Dextran 70-hypromellose 0.1-0.3% soln 1 drop QID    Multivitamin daily (Centrum 50+)   Omega-3 1000 mg daily  Potassium Chloride 40 mEq daily   Vitamin B12 1000 mcg daily -Educated on Cost vs benefit of each product must be carefully weighed by individual consumer -Patient is satisfied with current therapy and denies issues -Recommended stopping fish oil as she may not  need to take due to her current cholesterol numbers.  Patient Goals/Self-Care Activities . Patient will:  - take medications as prescribed check blood pressure weekly, document, and provide at future appointments  Follow Up Plan: Telephone follow up appointment with care management team member scheduled for: 6 months       Patient verbalizes understanding of instructions provided today and agrees to view in [facility] Telephone follow up appointment with pharmacy team member scheduled for:  MaViona GilmoreRPNew Millennium Surgery Center PLLCHow to Take Your Blood Pressure Blood pressure measures how strongly your blood is pressing against the walls of your arteries. Arteries are blood vessels that carry blood from your heart throughout your body. You can take your blood pressure at home with a machine. You may need to check your blood pressure at home:  To check if you have high blood pressure (hypertension).  To check your blood pressure over time.  To make sure your blood pressure medicine is working. Supplies needed:  Blood pressure machine, or monitor.  Dining room chair to sit in.  Table or desk.  Small notebook.  Pencil or pen. How to prepare Avoid these things for 30 minutes before checking your blood pressure:  Having drinks with caffeine in them, such as coffee or tea.  Drinking alcohol.  Eating.  Smoking.  Exercising. Do these things five minutes before checking your blood pressure:  Go to the bathroom and pee (urinate).  Sit in a dining chair. Do not sit in a soft couch or an armchair.  Be quiet. Do not talk. How to take your blood pressure Follow the instructions that came with your machine. If you have a digital blood pressure monitor, these  may be the instructions: 1. Sit up straight. 2. Place your feet on the floor. Do not cross your ankles or legs. 3. Rest your left arm at the level of your heart. You may rest it on a table, desk, or chair. 4. Pull up your shirt  sleeve. 5. Wrap the blood pressure cuff around the upper part of your left arm. The cuff should be 1 inch (2.5 cm) above your elbow. It is best to wrap the cuff around bare skin. 6. Fit the cuff snugly around your arm. You should be able to place only one finger between the cuff and your arm. 7. Place the cord so that it rests in the bend of your elbow. 8. Press the power button. 9. Sit quietly while the cuff fills with air and loses air. 10. Write down the numbers on the screen. 11. Wait 2-3 minutes and then repeat steps 1-10.   What do the numbers mean? Two numbers make up your blood pressure. The first number is called systolic pressure. The second is called diastolic pressure. An example of a blood pressure reading is "120 over 80" (or 120/80). If you are an adult and do not have a medical condition, use this guide to find out if your blood pressure is normal: Normal  First number: below 120.  Second number: below 80. Elevated  First number: 120-129.  Second number: below 80. Hypertension stage 1  First number: 130-139.  Second number: 80-89. Hypertension stage 2  First number: 140 or above.  Second number: 50 or above. Your blood pressure is above normal even if only the top or bottom number is above normal. Follow these instructions at home:  Check your blood pressure as often as your doctor tells you to.  Check your blood pressure at the same time every day.  Take your monitor to your next doctor's appointment. Your doctor will: ? Make sure you are using it correctly. ? Make sure it is working right.  Make sure you understand what your blood pressure numbers should be.  Tell your doctor if your medicine is causing side effects.  Keep all follow-up visits as told by your doctor. This is important. General tips:  You will need a blood pressure machine, or monitor. Your doctor can suggest a monitor. You can buy one at a drugstore or online. When choosing  one: ? Choose one with an arm cuff. ? Choose one that wraps around your upper arm. Only one finger should fit between your arm and the cuff. ? Do not choose one that measures your blood pressure from your wrist or finger. Where to find more information American Heart Association: www.heart.org Contact a doctor if:  Your blood pressure keeps being high. Get help right away if:  Your first blood pressure number is higher than 180.  Your second blood pressure number is higher than 120. Summary  Check your blood pressure at the same time every day.  Avoid caffeine, alcohol, smoking, and exercise for 30 minutes before checking your blood pressure.  Make sure you understand what your blood pressure numbers should be. This information is not intended to replace advice given to you by your health care provider. Make sure you discuss any questions you have with your health care provider. Document Revised: 11/06/2019 Document Reviewed: 11/06/2019 Elsevier Patient Education  2021 [employer].

## 2021-02-09 ENCOUNTER — Inpatient Hospital Stay (HOSPITAL_COMMUNITY)
Admission: EM | Admit: 2021-02-09 | Discharge: 2021-02-15 | DRG: 540 | Disposition: A | Discharge status: Home Health | Payer: Medicare Other | Attending: Internal Medicine | Admitting: Internal Medicine

## 2021-02-09 ENCOUNTER — Emergency Department (HOSPITAL_COMMUNITY): Payer: Medicare Other

## 2021-02-09 ENCOUNTER — Encounter (HOSPITAL_COMMUNITY): Payer: Self-pay | Admitting: *Deleted

## 2021-02-09 DIAGNOSIS — Z881 Allergy status to other antibiotic agents status: Secondary | ICD-10-CM | POA: Diagnosis not present

## 2021-02-09 DIAGNOSIS — M4644 Discitis, unspecified, thoracic region: Secondary | ICD-10-CM | POA: Diagnosis not present

## 2021-02-09 DIAGNOSIS — Z800 Family history of malignant neoplasm of digestive organs: Secondary | ICD-10-CM

## 2021-02-09 DIAGNOSIS — K50819 Crohn's disease of both small and large intestine with unspecified complications: Secondary | ICD-10-CM | POA: Diagnosis present

## 2021-02-09 DIAGNOSIS — R109 Unspecified abdominal pain: Secondary | ICD-10-CM

## 2021-02-09 DIAGNOSIS — Z888 Allergy status to other drugs, medicaments and biological substances status: Secondary | ICD-10-CM | POA: Diagnosis not present

## 2021-02-09 DIAGNOSIS — K50818 Crohn's disease of both small and large intestine with other complication: Secondary | ICD-10-CM | POA: Diagnosis present

## 2021-02-09 DIAGNOSIS — K219 Gastro-esophageal reflux disease without esophagitis: Secondary | ICD-10-CM | POA: Diagnosis present

## 2021-02-09 DIAGNOSIS — E538 Deficiency of other specified B group vitamins: Secondary | ICD-10-CM | POA: Diagnosis present

## 2021-02-09 DIAGNOSIS — Z79899 Other long term (current) drug therapy: Secondary | ICD-10-CM

## 2021-02-09 DIAGNOSIS — Z9049 Acquired absence of other specified parts of digestive tract: Secondary | ICD-10-CM

## 2021-02-09 DIAGNOSIS — Z803 Family history of malignant neoplasm of breast: Secondary | ICD-10-CM | POA: Diagnosis not present

## 2021-02-09 DIAGNOSIS — M4624 Osteomyelitis of vertebra, thoracic region: Secondary | ICD-10-CM | POA: Diagnosis not present

## 2021-02-09 DIAGNOSIS — Z83511 Family history of glaucoma: Secondary | ICD-10-CM

## 2021-02-09 DIAGNOSIS — F32A Depression, unspecified: Secondary | ICD-10-CM | POA: Diagnosis present

## 2021-02-09 DIAGNOSIS — Z789 Other specified health status: Secondary | ICD-10-CM | POA: Diagnosis present

## 2021-02-09 DIAGNOSIS — Z820 Family history of epilepsy and other diseases of the nervous system: Secondary | ICD-10-CM | POA: Diagnosis not present

## 2021-02-09 DIAGNOSIS — Z87891 Personal history of nicotine dependence: Secondary | ICD-10-CM | POA: Diagnosis not present

## 2021-02-09 DIAGNOSIS — K912 Postsurgical malabsorption, not elsewhere classified: Secondary | ICD-10-CM | POA: Diagnosis present

## 2021-02-09 DIAGNOSIS — Z95828 Presence of other vascular implants and grafts: Secondary | ICD-10-CM

## 2021-02-09 DIAGNOSIS — K5080 Crohn's disease of both small and large intestine without complications: Secondary | ICD-10-CM | POA: Diagnosis present

## 2021-02-09 DIAGNOSIS — M810 Age-related osteoporosis without current pathological fracture: Secondary | ICD-10-CM | POA: Diagnosis present

## 2021-02-09 DIAGNOSIS — D631 Anemia in chronic kidney disease: Secondary | ICD-10-CM | POA: Diagnosis present

## 2021-02-09 DIAGNOSIS — K529 Noninfective gastroenteritis and colitis, unspecified: Secondary | ICD-10-CM | POA: Diagnosis present

## 2021-02-09 DIAGNOSIS — Z79891 Long term (current) use of opiate analgesic: Secondary | ICD-10-CM

## 2021-02-09 DIAGNOSIS — I129 Hypertensive chronic kidney disease with stage 1 through stage 4 chronic kidney disease, or unspecified chronic kidney disease: Secondary | ICD-10-CM | POA: Diagnosis present

## 2021-02-09 DIAGNOSIS — Z832 Family history of diseases of the blood and blood-forming organs and certain disorders involving the immune mechanism: Secondary | ICD-10-CM

## 2021-02-09 DIAGNOSIS — R569 Unspecified convulsions: Secondary | ICD-10-CM | POA: Diagnosis present

## 2021-02-09 DIAGNOSIS — R7881 Bacteremia: Secondary | ICD-10-CM

## 2021-02-09 DIAGNOSIS — Z885 Allergy status to narcotic agent status: Secondary | ICD-10-CM | POA: Diagnosis not present

## 2021-02-09 DIAGNOSIS — Z833 Family history of diabetes mellitus: Secondary | ICD-10-CM

## 2021-02-09 DIAGNOSIS — Z8614 Personal history of Methicillin resistant Staphylococcus aureus infection: Secondary | ICD-10-CM

## 2021-02-09 DIAGNOSIS — N183 Chronic kidney disease, stage 3 unspecified: Secondary | ICD-10-CM | POA: Diagnosis present

## 2021-02-09 DIAGNOSIS — K90829 Short bowel syndrome, unspecified: Secondary | ICD-10-CM | POA: Diagnosis present

## 2021-02-09 DIAGNOSIS — G894 Chronic pain syndrome: Secondary | ICD-10-CM | POA: Diagnosis present

## 2021-02-09 DIAGNOSIS — Z8719 Personal history of other diseases of the digestive system: Secondary | ICD-10-CM

## 2021-02-09 DIAGNOSIS — Z20822 Contact with and (suspected) exposure to covid-19: Secondary | ICD-10-CM | POA: Diagnosis present

## 2021-02-09 DIAGNOSIS — Z90710 Acquired absence of both cervix and uterus: Secondary | ICD-10-CM

## 2021-02-09 DIAGNOSIS — Z8249 Family history of ischemic heart disease and other diseases of the circulatory system: Secondary | ICD-10-CM

## 2021-02-09 LAB — BASIC METABOLIC PANEL
Anion gap: 11 (ref 5–15)
BUN: 29 mg/dL — ABNORMAL HIGH (ref 6–20)
CO2: 21 mmol/L — ABNORMAL LOW (ref 22–32)
Calcium: 9.6 mg/dL (ref 8.9–10.3)
Chloride: 108 mmol/L (ref 98–111)
Creatinine, Ser: 1.24 mg/dL — ABNORMAL HIGH (ref 0.44–1.00)
GFR, Estimated: 50 mL/min — ABNORMAL LOW (ref >60)
Glucose, Bld: 84 mg/dL (ref 70–99)
Potassium: 3.6 mmol/L (ref 3.5–5.1)
Sodium: 140 mmol/L (ref 135–145)

## 2021-02-09 LAB — CBC
HCT: 27.7 % — ABNORMAL LOW (ref 36.0–46.0)
Hemoglobin: 8.9 g/dL — ABNORMAL LOW (ref 12.0–15.0)
MCH: 29 pg (ref 26.0–34.0)
MCHC: 32.1 g/dL (ref 30.0–36.0)
MCV: 90.2 fL (ref 80.0–100.0)
Platelets: 235 10*3/uL (ref 150–400)
RBC: 3.07 MIL/uL — ABNORMAL LOW (ref 3.87–5.11)
RDW: 13.2 % (ref 11.5–15.5)
WBC: 5.8 10*3/uL (ref 4.0–10.5)
nRBC: 0 % (ref 0.0–0.2)

## 2021-02-09 LAB — URINALYSIS, ROUTINE W REFLEX MICROSCOPIC
Bilirubin Urine: NEGATIVE
Glucose, UA: NEGATIVE mg/dL
Hgb urine dipstick: NEGATIVE
Ketones, ur: 5 mg/dL — AB
Leukocytes,Ua: NEGATIVE
Nitrite: NEGATIVE
Protein, ur: >=300 mg/dL — AB
Specific Gravity, Urine: 1.033 — ABNORMAL HIGH (ref 1.005–1.030)
pH: 5 (ref 5.0–8.0)

## 2021-02-09 LAB — HEPATIC FUNCTION PANEL
ALT: 14 U/L (ref 0–44)
AST: 15 U/L (ref 15–41)
Albumin: 3.3 g/dL — ABNORMAL LOW (ref 3.5–5.0)
Alkaline Phosphatase: 134 U/L — ABNORMAL HIGH (ref 38–126)
Bilirubin, Direct: 0.1 mg/dL (ref 0.0–0.2)
Indirect Bilirubin: 0.9 mg/dL (ref 0.3–0.9)
Total Bilirubin: 1 mg/dL (ref 0.3–1.2)
Total Protein: 7.5 g/dL (ref 6.5–8.1)

## 2021-02-09 LAB — TROPONIN I (HIGH SENSITIVITY)
Troponin I (High Sensitivity): 4 ng/L (ref <18)
Troponin I (High Sensitivity): 6 ng/L (ref <18)

## 2021-02-09 LAB — LACTIC ACID, PLASMA: Lactic Acid, Venous: 1 mmol/L (ref 0.5–1.9)

## 2021-02-09 LAB — LIPASE, BLOOD: Lipase: 25 U/L (ref 11–51)

## 2021-02-09 MED ORDER — HYDROMORPHONE HCL 1 MG/ML IJ SOLN
0.5000 mg | Freq: Once | INTRAMUSCULAR | Status: AC
  Administered 2021-02-09: 0.5 mg via INTRAVENOUS
  Filled 2021-02-09: qty 1

## 2021-02-09 MED ORDER — SODIUM CHLORIDE 0.9 % IV BOLUS
1000.0000 mL | Freq: Once | INTRAVENOUS | Status: AC
  Administered 2021-02-09: 1000 mL via INTRAVENOUS

## 2021-02-09 MED ORDER — IOHEXOL 350 MG/ML SOLN
100.0000 mL | Freq: Once | INTRAVENOUS | Status: AC | PRN
  Administered 2021-02-09: 100 mL via INTRAVENOUS

## 2021-02-09 MED ORDER — HYDROMORPHONE HCL 1 MG/ML IJ SOLN
1.0000 mg | Freq: Once | INTRAMUSCULAR | Status: AC
  Administered 2021-02-10: 1 mg via INTRAVENOUS
  Filled 2021-02-09: qty 1

## 2021-02-09 MED ORDER — ONDANSETRON HCL 4 MG/2ML IJ SOLN
4.0000 mg | Freq: Once | INTRAMUSCULAR | Status: AC
  Administered 2021-02-10: 4 mg via INTRAVENOUS
  Filled 2021-02-09: qty 2

## 2021-02-09 MED ORDER — FENTANYL CITRATE (PF) 100 MCG/2ML IJ SOLN
50.0000 ug | Freq: Once | INTRAMUSCULAR | Status: AC
  Administered 2021-02-09: 50 ug via INTRAVENOUS
  Filled 2021-02-09: qty 2

## 2021-02-09 NOTE — ED Provider Notes (Signed)
[facility] DEPT Provider Note   CSN: [id number] Arrival date & time: 02/09/21  1053     History Chief Complaint  Patient presents with  . Shoulder Pain    Caitlin Peters is a 60 y.o. female history of pancreatitis, anasarca, AVN, choledocholithiasis, chronic pain syndrome, CKD, Crohn's, short gut syndrome, GERD, hypertension, anemia, osteoporosis, bacteremia, drug-seeking behavior.  Patient presents to the ER today for concern of back pain.  She reports both lower and upper back pain which has been present for several months but has worsened over the past 2-3 days she reports that she has chronic pain and feels that this is an exacerbation of her normal pain.  She has been using her pain medication at home without relief denies any falls/injuries.  She reports that the pain moves around her body throughout the day sometimes it is located in her back other times it will move to her abdomen or her chest she cannot identify any clear factors that cause her pain to move or worsen.  In triage she reported the pain was in her shoulders, during my evaluation she reports that it is in her chest and no longer in her back she reports it is aching moderate intensity constant nonradiating no clear aggravating or alleviating factors.  Denies fever/chills, fall/injury, numbness/tingling, weakness, cough/hemoptysis, shortness of breath, nausea/vomiting, diarrhea or any additional concerns.  HPI     Past Medical History:  Diagnosis Date  . Acute pancreatitis 04/13/2020  . Anasarca 10/2019  . AVN (avascular necrosis of bone) ([place])   . Cataract   . Choledocholithiasis (sludge) s/p ERCP 10/2019 10/21/2020  . Chronic pain syndrome   . CKD (chronic kidney disease), stage III ([place])   . Crohn disease ([place])   . Crohn's disease of small & large intestine with SGS 1984   Caitlin Peters is a 60year old female with a history of Crohn's disease diagnosed in 55 (age 77),  history of long-term steroid use with osteoporosis, S/P multiple bowel resections ([id number]) complicated by chronic back and abdominal pain, steatorrhea and short bowel syndrome. The patient has been left with ~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 attached to proximal transverse colon through rectum. She has been  . Depression   . Diverticulosis   . GERD (gastroesophageal reflux disease)   . HTN (hypertension)   . IDA (iron deficiency anemia)   . Malnutrition ([place])   . Mass in chest   . Osteoporosis   . Osteoporosis 12/24/2014  . Pancreatitis   . 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 Vitamin B12 deficiency     Patient Active Problem List   Diagnosis Date Noted  . Pressure injury of skin 12/18/2020  . Bacteremia 12/17/2020  . Seizures ([place]) 12/17/2020  . Drug-seeking behavior 12/16/2020  . Seizure ([place]) 12/15/2020  . CKD (chronic kidney disease) stage 3, GFR 30-59 ml/min ([name]) 12/06/2020  . COVID-19 virus infection 12/06/2020  . Protein calorie malnutrition ([place]) 12/06/2020  . Palpitations 11/22/2020  . PAC (premature atrial contraction) 11/22/2020  . History of central line-associated bloodstream infection (CLABSI) 10/21/2020  . Nausea & vomiting 10/21/2020  . Choledocholithiasis (sludge) s/p ERCP 10/2019 10/21/2020  . 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 Abdominal pain 09/07/2020  . Acute metabolic encephalopathy   .  Hypomagnesemia   . Partial small bowel obstruction ([place])   . Bacteremia associated with intravascular line ([place]) 08/04/2020  . Enterobacter sepsis ([place]) 07/22/2020  . Anxiety 06/03/2020  . Acute pancreatitis 04/13/2020  . Infection due to Acinetobacter species 03/10/2020  . Pancytopenia ([place]) 03/05/2020  . Central line  infection   . Elevated liver enzymes 01/02/2020  . Cholangitis   . Anasarca 10/28/2019  . Acute kidney injury superimposed on chronic kidney disease ([place]) 10/28/2019  . Falls 10/28/2019  . Malnutrition ([place])   . Vitamin B12 deficiency   . GERD (gastroesophageal reflux disease)   . Chronic pain syndrome   . Hypokalemia due to excessive gastrointestinal loss of potassium 10/13/2019  . Fever   . Hypokalemia 10/12/2019  . Chronic diarrhea 10/12/2019  . Dysuria 10/12/2019  . Bilateral lower extremity edema 10/12/2019  . AKI (acute kidney injury) ([place]) 10/12/2019  . Fungemia 08/27/2019  . IDA (iron deficiency anemia) 11/03/2018  . Dilation of biliary tract 08/28/2018  . Severe diarrhea 03/07/2018  . LFTs abnormal 01/27/2018  . GI tract obstruction ([place]) 01/27/2018  . Gram-negative bacteremia 09/13/2017  . HTN (hypertension), benign 12/02/2016  . Intractable pain 12/02/2016  . Anemia 12/02/2016  . 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 Avascular necrosis ([place]) 06/14/2015  . Polyarthralgia 05/03/2015  . On total parenteral nutrition (TPN) 12/30/2014  . Osteoporosis 12/24/2014  . Low back pain 12/16/2014  . Vitamin D deficiency 12/16/2014  . 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 History of colonic diverticulitis 2014  . Depression 07/24/2012  . Gram-positive bacteremia 07/24/2012  . Small bowel obstruction due to adhesions ([place]) 07/24/2012  . Diarrhea 12/13/2011  . Neuralgia and neuritis 06/01/2011  . Myalgia and myositis 08/12/2003  . Crohn's disease of small & large intestine with SGS 1984    Past Surgical History:  Procedure Laterality Date  . ABDOMINAL ADHESION SURGERY  01/22/2018  . APPENDECTOMY  1989  . BILIARY DILATION  11/26/2019   Procedure: BILIARY DILATION;  Surgeon: Jackquline Denmark, MD;  Location: [facility];  Service: Endoscopy;;  . BILIARY DILATION  03/08/2020   Procedure: BILIARY DILATION;  Surgeon: Irving Copas., MD;  Location: [facility];  Service:  Gastroenterology;;  . BIOPSY  03/08/2020   Procedure: BIOPSY;  Surgeon: Irving Copas., MD;  Location: [facility];  Service: Gastroenterology;;  . CHEST WALL RESECTION     right thoracotomy,resection of chest mass with anterior rib and reconstruction using prosthetic mesh and video arthroscopy  . CHOLECYSTECTOMY  01/22/2018  . COLONOSCOPY  2019  . ENTEROSTOMY CLOSURE  04/1999  . ERCP N/A 11/26/2019   Procedure: ENDOSCOPIC RETROGRADE CHOLANGIOPANCREATOGRAPHY (ERCP);  Surgeon: Jackquline Denmark, MD;  Location: [facility];  Service: Endoscopy;  Laterality: N/A;  . ERCP N/A 03/08/2020   Procedure: ENDOSCOPIC RETROGRADE CHOLANGIOPANCREATOGRAPHY (ERCP);  Surgeon: Irving Copas., MD;  Location: [facility];  Service: Gastroenterology;  Laterality: N/A;  . ESOPHAGOGASTRODUODENOSCOPY N/A 03/08/2020   Procedure: ESOPHAGOGASTRODUODENOSCOPY (EGD);  Surgeon: Irving Copas., MD;  Location: [facility];  Service: Gastroenterology;  Laterality: N/A;  . EUS N/A 03/08/2020   Procedure: UPPER ENDOSCOPIC ULTRASOUND (EUS) LINEAR;  Surgeon: Irving Copas., MD;  Location: [facility];  Service: Gastroenterology;  Laterality: N/A;  . ILEOCECETOMY  03/1999   ileocolon resection with abdominal stoma  . ILEOSTOMY CLOSURE  2001  . IR FLUORO GUIDE CV LINE LEFT  01/07/2020  . IR FLUORO GUIDE CV LINE LEFT  03/09/2020  . IR FLUORO GUIDE CV LINE LEFT  05/09/2020  . IR FLUORO  GUIDE CV LINE LEFT  07/20/2020  . IR FLUORO GUIDE CV LINE RIGHT  08/05/2020  . IR PTA VENOUS EXCEPT DIALYSIS CIRCUIT  01/07/2020  . IR REMOVAL TUN CV CATH W/O FL  08/05/2020  . IR US GUIDE VASC ACCESS LEFT     x 2 06/17/19 and 09/14/2019  . IR US GUIDE VASC ACCESS RIGHT  08/05/2020  . KNEE SURGERY     right knee   . LAPAROSCOPIC SMALL BOWEL RESECTION  2009   2000-2009.  SB resections for Crohns Disease - now with Short gut  . OMENTECTOMY  01/22/2018  . PARTIAL HYSTERECTOMY  1984   with LSO  . REMOVAL OF STONES   11/26/2019   Procedure: REMOVAL OF STONES;  Surgeon: Jackquline Denmark, MD;  Location: [facility];  Service: Endoscopy;;  . REMOVAL OF STONES  03/08/2020   Procedure: REMOVAL OF STONES;  Surgeon: Irving Copas., MD;  Location: [facility];  Service: Gastroenterology;;  . SALPINGOOPHORECTOMY Left 1984  . SALPINGOOPHORECTOMY Right 1990  . SERIAL TRANSVERSE ENTEROPLASTY (STEP) - SMALL BOWEL LENGTHENING  01/22/2018   Dr Alene Mires, [place] length from 120 to 165cm   . SMALL INTESTINE SURGERY  2002  . SMALL INTESTINE SURGERY  2003  . SPHINCTEROTOMY  11/26/2019   Procedure: SPHINCTEROTOMY;  Surgeon: Jackquline Denmark, MD;  Location: [facility];  Service: Endoscopy;;  . TOTAL ABDOMINAL HYSTERECTOMY  1990   with RSO  . UPPER GASTROINTESTINAL ENDOSCOPY       OB History   No obstetric history on file.     Family History  Problem Relation Age of Onset  . Breast cancer Sister   . Multiple sclerosis Sister   . Diabetes Sister   . Lupus Sister   . Colon cancer Other   . Crohn's disease Other   . Seizures Mother   . Glaucoma Mother   . CAD Father   . Heart disease Father   . Hypertension Father     Social History   Tobacco Use  . Smoking status: Former Research scientist (life sciences)  . Smokeless tobacco: Never Used  Vaping Use  . Vaping Use: Never used  Substance Use Topics  . Alcohol use: Not Currently  . Drug use: Never    Home Medications Prior to Admission medications   Medication Sig Start Date End Date Taking? Authorizing Provider  acetaminophen (TYLENOL) 325 MG tablet Take 650 mg by mouth every 6 (six) hours as needed for mild pain.     [provider]  amLODipine (NORVASC) 10 MG tablet Take 1 tablet (10 mg total) by mouth daily. 12/07/20 01/06/21  Erline Hau, MD  budesonide (ENTOCORT EC) 3 MG 24 hr capsule TAKE 3 CAPSULES BY MOUTH ONCE DAILY Patient taking differently: Take 9 mg by mouth every other day. 04/05/20   [name], [name], MD   Calcium 200 MG TABS Take 200 mg by mouth daily.    [provider]  cholecalciferol (VITAMIN D3) 25 MCG (1000 UT) tablet Take 1,000 Units by mouth daily.     [provider]  cycloSPORINE (RESTASIS) 0.05 % ophthalmic emulsion Place 1 drop into both eyes 2 (two) times daily.    [provider]  dexlansoprazole (DEXILANT) 60 MG capsule Take 1 capsule (60 mg total) by mouth daily. 12/28/20   [name], [name], MD  Dextran 70-Hypromellose 0.1-0.3 % SOLN Place 1 drop into both eyes 4 (four) times daily.    [provider]  diphenoxylate-atropine (LOMOTIL)  2.5-0.025 MG tablet TAKE 1 TABLET BY MOUTH 4 TIMES DAILY AS NEEDED FOR DIARRHEA OR  LOOSE  STOOLS 12/28/20   [name], [name], MD  DULoxetine (CYMBALTA) 30 MG capsule Take 3 capsules (90 mg total) by mouth daily. 12/07/20   [name], [name], MD  estradiol (ESTRACE) 2 MG tablet Take 1 tablet (2 mg total) by mouth daily. 12/07/20   Erline Hau, MD  GATTEX 5 MG KIT Inject 5 mLs into the skin daily. 01/03/21   [provider]  hydrALAZINE (APRESOLINE) 50 MG tablet Take 1.5 tablets (75 mg total) by mouth 3 (three) times daily. 11/22/20 02/20/21  Rudean Haskell A, MD  HYDROmorphone (DILAUDID) 4 MG tablet Take 4 mg by mouth 4 (four) times daily as needed for moderate pain.  09/15/20   [provider]  levETIRAcetam (KEPPRA) 500 MG tablet Take 1 tablet (500 mg total) by mouth 2 (two) times daily. 01/20/21 02/19/21  [name], [name], MD  lipase/protease/amylase (CREON) 36000 UNITS CPEP capsule Take 1 capsule (36,000 Units total) by mouth 3 (three) times daily as needed (with meals for digestion). 06/28/20   [name], [name], MD  loperamide (IMODIUM) 2 MG capsule TAKE 1 CAPSULE BY MOUTH AS NEEDED FOR DIARRHEA OR LOOSE STOOLS 01/20/21   [name], [name], MD  methadone (DOLOPHINE) 5 MG tablet Take 1 tablet (5 mg total) by mouth 5 (five) times  daily. 06/30/20   Florencia Reasons, MD  metoprolol tartrate (LOPRESSOR) 100 MG tablet Take 1 tablet (100 mg total) by mouth 2 (two) times daily. 12/07/20   [name], [name], MD  Multiple Vitamins-Minerals (MULTIVITAMIN ADULT PO) Take 1 tablet by mouth daily.    [provider]  NARCAN 4 MG/0.1ML LIQD nasal spray kit Place 1 spray into the nose once.  10/14/20   [provider]  PRESCRIPTION MEDICATION Inject 1 each into the vein daily. Home TPN . [facility]  . 1 bag for 12 hours. [id number]    [provider]  Probiotic Product (PROBIOTIC-10 PO) Take 1 capsule by mouth daily.     [provider]  promethazine (PHENERGAN) 25 MG tablet Take 1 tablet (25 mg total) by mouth every 6 (six) hours as needed for nausea or vomiting. 01/04/21   [name], [name], MD  sodium chloride 0.9 % infusion Inject into the vein. 10/16/20   [provider]  sucralfate (CARAFATE) 1 GM/10ML suspension Take 10 mLs (1 g total) by mouth 4 (four) times daily -  with meals and at bedtime. 06/28/20   [name], [name], MD  Trace Minerals Cu-Mn-Se-Zn (TRALEMENT IV) Inject 1 mL into the vein. Used in hydration bag 4 times weekly    [provider]  vitamin B-12 (CYANOCOBALAMIN) 100 MCG tablet Take 1,000 mcg by mouth daily.     [provider]    Allergies    Meperidine, Hyoscyamine, Cefepime, Gabapentin, Lyrica [pregabalin], Topamax [topiramate], Zosyn [piperacillin sod-tazobactam so], Fentanyl, and Morphine and related  Review of Systems   Review of Systems Ten systems are reviewed and are negative for acute change except as noted in the HPI  Physical Exam Updated Vital Signs BP 106/75 (BP Location: Right Arm)   Pulse 84   Temp 98.5 F (36.9 C) (Oral)   Resp 17   SpO2 100%   Physical Exam Constitutional:      General: She is not in acute distress.    Appearance:  Normal appearance. She is well-developed. She is  not ill-appearing or diaphoretic.  HENT:     Head: Normocephalic and atraumatic.  Eyes:     General: Vision grossly intact. Gaze aligned appropriately.     Pupils: Pupils are equal, round, and reactive to light.  Neck:     Trachea: Trachea and phonation normal.  Cardiovascular:     Rate and Rhythm: Normal rate and regular rhythm.  Pulmonary:     Effort: Pulmonary effort is normal. No respiratory distress.     Breath sounds: Normal breath sounds.  Abdominal:     General: There is no distension.     Palpations: Abdomen is soft.     Tenderness: There is no abdominal tenderness. There is no guarding or rebound.  Musculoskeletal:        General: Normal range of motion.     Cervical back: Normal range of motion.  Skin:    General: Skin is warm and dry.  Neurological:     Mental Status: She is alert.     GCS: GCS eye subscore is 4. GCS verbal subscore is 5. GCS motor subscore is 6.     Comments: Speech is clear and goal oriented, follows commands Major Cranial nerves without deficit, no facial droop Moves extremities without ataxia, coordination intact  Psychiatric:        Behavior: Behavior normal.     ED Results / Procedures / Treatments   Labs (all labs ordered are listed, but only abnormal results are displayed) Labs Reviewed - No data to display  EKG EKG Interpretation  Date/Time:  Thursday February 09 2021 14:28:49 EDT Ventricular Rate:  79 PR Interval:    QRS Duration: 80 QT Interval:  380 QTC Calculation: 435 R Axis:   79 Text Interpretation: Sinus rhythm with Premature atrial complexes Minimal voltage criteria for LVH, may be normal variant ( Sokolow-Lyon ) Borderline ECG No significant change since last tracing Confirmed by Deno Etienne [phone]) on 02/09/2021 6:00:01 PM   Radiology No results found.  Procedures Procedures   Medications Ordered in ED Medications - No data to display  ED Course  I have reviewed the triage vital signs and the nursing  notes.  Pertinent labs & imaging results that were available during my care of the patient were reviewed by me and considered in my medical decision making (see chart for details).    MDM Rules/Calculators/A&P                         Additional history obtained from: 1. Nursing notes from this visit. 2. Electronic medical records. -------------------- 60 year old female with history as above presented for initially shoulder pain today during my evaluation she is reporting chest pain.  The pain moves around her body throughout the day she cannot identify any factors her vital signs are stable on arrival.  Will obtain chest pain work-up and monitor patient, abdominal pain labs have been added as patient reports she did have some abdominal pain earlier.  She is neurovascular intact denies any neurologic symptoms to suggest cauda equina she has no infectious symptoms or history of trauma. - CBC shows baseline anemia of 8.9, no leukocytosis to suggest infection, no thrombocytopenia. BMP shows slight worsening of kidney function with creatinine 1.24 no emergent electrolyte derangement or gap. Lactic within normal limits is reassuring. Initial high-sensitivity troponin within normal limits, reassuring. LFTs show no emergent elevations. Lipase within normal limits, doubt acute pancreatitis. Urinalysis shows protein and  ketones, appears contaminated no urinary symptoms doubt UTI.  CXR:  IMPRESSION:  No active disease.   EKG: Sinus rhythm with Premature atrial complexes Minimal voltage criteria for LVH, may be normal variant ( Sokolow-Lyon ) Borderline ECG No significant change since last tracing Confirmed by Deno Etienne [phone]) on 02/09/2021 6:00:01 PM - Patient received fentanyl and IV fluid.  Patient reassessed, she is resting in bed no acute distress but reports worsening of pain.  She reports pain is now rating from her chest to her back, she is more hypertensive than arrival with blood per 891  systolic.  Given her worsening of symptoms and radiation of pain will obtain CT angio chest abdomen pelvis dissection study for further evaluation. - Patient reevaluated remains comfortable appearing, she is requesting Dilaudid as she reports this is what she takes normally at home and has not had it today. I reviewed patient's PMDP, it appears around 1 month ago January 13, 2021 she received her monthly pain medications of methadone 5 mg tablets 120 count and hydromorphone 4 mg tablets 150 count.  Care handoff given to [facility] PA-C at shift change.  Plan of care is to follow-up on CT dissection study, pending no emergent findings anticipate discharge. Final disposition per oncoming team.    Note: Portions of this report may have been transcribed using voice recognition software. Every effort was made to ensure accuracy; however, inadvertent computerized transcription errors may still be present. Final Clinical Impression(s) / ED Diagnoses Final diagnoses:  None    Rx / DC Orders ED Discharge Orders    None       Gari Crown 02/09/21 [place], [place], DO 02/09/21 2208

## 2021-02-09 NOTE — ED Triage Notes (Addendum)
Per EMS, pt complained of right shoulder pain with EMS, now complains of left shoulder pain. Does not currently have pain in right arm. She has PICC line right thigh.   BP 184/82 HR 60 RR 22 SpO2 98% RA CBG 103

## 2021-02-09 NOTE — Discharge Instructions (Signed)
At this time there does not appear to be the presence of an emergent medical condition, however there is always the potential for conditions to change. Please read and follow the below instructions.  Please return to the Emergency Department immediately for any new or worsening symptoms. Please be sure to follow up with your Primary Care Provider within one week regarding your visit today; please call their office to schedule an appointment even if you are feeling better for a follow-up visit. Please take your blood pressure medication as prescribed by her primary care provider and have it rechecked this week at your follow-up visit.   Please read the additional information packets attached to your discharge summary.  Do not take your medicine if  develop an itchy rash, swelling in your mouth or lips, or difficulty breathing; call 911 and seek immediate emergency medical attention if this occurs.  You may review your lab tests and imaging results in their entirety on your MyChart account.  Please discuss all results of fully with your primary care provider and other specialist at your follow-up visit.  Note: Portions of this text may have been transcribed using voice recognition software. Every effort was made to ensure accuracy; however, inadvertent computerized transcription errors may still be present.

## 2021-02-09 NOTE — ED Provider Notes (Signed)
22:00: Assumed care of patient from Nuala Alpha, PA-C at change of shift pending CTA & delta troponin   Briefly patient is a 60 year old female with multiple medical comorbidities including short gut syndrome, Crohn's, CKD, and prior bacteremia who presented to the emergency department with complaints of both lower and upper back pain x 1 week.   CTA: 1. Paraspinal soft tissue swelling and bony irregularities within the upper thoracic spine from T2 through T4, compatible with discitis/osteomyelitis. 2. No evidence of thoracoabdominal aneurysm or dissection. Minimal atherosclerosis is noted. 3. Diffuse colonic wall thickening consistent with colitis, compatible with patient's known history of Crohn disease.   Re-evaluated patient- she continues to have pain & nausea.  Sensation grossly intact x 4.  5/5 symmetric grip strength & strength with plantar/dorsiflexion bilaterally.   23:52: CONSULT: Discussed with neurosurgeon Dr. Reatha Armour- recommends CRP/ESR, blood cultures, & MRI T spine w/wo. Okay to have MRI in AM. Hold abx. Admit to hospitalist service. Appreciate consultation.   00:05: CONSULT: Discussed with hospitliast Dr. Hal Hope- accepts admission.    Results for orders placed or performed during the hospital encounter of 62/83/15  Basic metabolic panel  Result Value Ref Range   Sodium 140 135 - 145 mmol/L   Potassium 3.6 3.5 - 5.1 mmol/L   Chloride 108 98 - 111 mmol/L   CO2 21 (L) 22 - 32 mmol/L   Glucose, Bld 84 70 - 99 mg/dL   BUN 29 (H) 6 - 20 mg/dL   Creatinine, Ser 1.24 (H) 0.44 - 1.00 mg/dL   Calcium 9.6 8.9 - 10.3 mg/dL   GFR, Estimated 50 (L) >60 mL/min   Anion gap 11 5 - 15  CBC  Result Value Ref Range   WBC 5.8 4.0 - 10.5 K/uL   RBC 3.07 (L) 3.87 - 5.11 MIL/uL   Hemoglobin 8.9 (L) 12.0 - 15.0 g/dL   HCT 27.7 (L) 36.0 - 46.0 %   MCV 90.2 80.0 - 100.0 fL   MCH 29.0 26.0 - 34.0 pg   MCHC 32.1 30.0 - 36.0 g/dL   RDW 13.2 11.5 - 15.5 %   Platelets 235 150 - 400  K/uL   nRBC 0.0 0.0 - 0.2 %  Hepatic function panel  Result Value Ref Range   Total Protein 7.5 6.5 - 8.1 g/dL   Albumin 3.3 (L) 3.5 - 5.0 g/dL   AST 15 15 - 41 U/L   ALT 14 0 - 44 U/L   Alkaline Phosphatase 134 (H) 38 - 126 U/L   Total Bilirubin 1.0 0.3 - 1.2 mg/dL   Bilirubin, Direct 0.1 0.0 - 0.2 mg/dL   Indirect Bilirubin 0.9 0.3 - 0.9 mg/dL  Lipase, blood  Result Value Ref Range   Lipase 25 11 - 51 U/L  Lactic acid, plasma  Result Value Ref Range   Lactic Acid, Venous 1.0 0.5 - 1.9 mmol/L  Urinalysis, Routine w reflex microscopic Urine, Clean Catch  Result Value Ref Range   Color, Urine YELLOW YELLOW   APPearance HAZY (A) CLEAR   Specific Gravity, Urine 1.033 (H) 1.005 - 1.030   pH 5.0 5.0 - 8.0   Glucose, UA NEGATIVE NEGATIVE mg/dL   Hgb urine dipstick NEGATIVE NEGATIVE   Bilirubin Urine NEGATIVE NEGATIVE   Ketones, ur 5 (A) NEGATIVE mg/dL   Protein, ur >=300 (A) NEGATIVE mg/dL   Nitrite NEGATIVE NEGATIVE   Leukocytes,Ua NEGATIVE NEGATIVE   RBC / HPF 0-5 0 - 5 RBC/hpf   WBC, UA 0-5 0 - 5  WBC/hpf   Bacteria, UA RARE (A) NONE SEEN   Squamous Epithelial / LPF 11-20 0 - 5   Mucus PRESENT   Troponin I (High Sensitivity)  Result Value Ref Range   Troponin I (High Sensitivity) 4 <18 ng/L  Troponin I (High Sensitivity)  Result Value Ref Range   Troponin I (High Sensitivity) 6 <18 ng/L   DG Bone Density  Result Date: 01/15/2021 Date of study: 01/12/21 Exam: DUAL X-RAY ABSORPTIOMETRY (DXA) FOR BONE MINERAL DENSITY (BMD) Instrument: [employer] Provider: PCP Indication: screening for osteoporosis Comparison: none (please note that it is not possible to compare data from different instruments) Clinical data: Pt is a 60 y.o. female with history of fracture. Results:  Lumbar spine L1-L4 Femoral neck (FN) 33% distal radius T-score -3.3 RFN: -2.1 LFN: -2.5 n/a Change in BMD from previous DXA test (%) n/a n/a n/a (*) statistically significant Assessment: Patient  has OSTEOPOROSIS according to the [facility] classification for osteoporosis (see below). Fracture risk: high Comments: the technical quality of the study is good  L3 was excluded from the spine score due to degenerative change Evaluation for secondary causes should be considered if clinically indicated. Recommend optimizing calcium (1200 mg/day) and vitamin D (800 IU/day). Treatment is indicated. Followup: Repeat BMD is appropriate after 2 years or after 1-2 years if starting treatment. WHO criteria for diagnosis of osteoporosis in postmenopausal women and in men 68 y/o or older: - normal: T-score -1.0 to + 1.0 - osteopenia/low bone density: T-score between -2.5 and -1.0 - osteoporosis: T-score below -2.5 - severe osteoporosis: T-score below -2.5 with history of fragility fracture Note: although not part of the WHO classification, the presence of a fragility fracture, regardless of the T-score, should be considered diagnostic of osteoporosis, provided other causes for the fracture have been excluded. Treatment: The National Osteoporosis Foundation recommends that treatment be considered in postmenopausal women and men age 18 or older with: 1. Hip or vertebral (clinical or morphometric) fracture 2. T-score of - 2.5 or lower at the spine or hip 3. 10-year fracture probability by FRAX of at least 20% for a major osteoporotic fracture and 3% for a hip fracture Loura Pardon MD   DG Chest Port 1 View  Result Date: 02/09/2021 CLINICAL DATA:  Chest pain. EXAM: PORTABLE CHEST 1 VIEW COMPARISON:  December 18, 2020. FINDINGS: The heart size and mediastinal contours are within normal limits. Both lungs are clear. No pneumothorax or pleural effusion is noted. The visualized skeletal structures are unremarkable. IMPRESSION: No active disease. Electronically Signed   By: Marijo Conception M.D.   On: 02/09/2021 19:15   CT Angio Chest/Abd/Pel for Dissection W and/or Wo Contrast  Result Date: 02/09/2021 CLINICAL DATA:  Bilateral  shoulder and abdominal pain EXAM: CT ANGIOGRAPHY CHEST, ABDOMEN AND PELVIS TECHNIQUE: Non-contrast CT of the chest was initially obtained. Multidetector CT imaging through the chest, abdomen and pelvis was performed using the standard protocol during bolus administration of intravenous contrast. Multiplanar reconstructed images and MIPs were obtained and reviewed to evaluate the vascular anatomy. CONTRAST:  152m OMNIPAQUE IOHEXOL 350 MG/ML SOLN COMPARISON:  12/15/2020 FINDINGS: CTA CHEST FINDINGS Cardiovascular: No evidence of thoracic aortic aneurysm or dissection. The heart is unremarkable without pericardial effusion. No evidence of pulmonary embolus. Mediastinum/Nodes: No pathologic adenopathy. Thyroid is heterogeneous but not enlarged. Trachea and esophagus are unremarkable. Lungs/Pleura: No airspace disease, effusion, or pneumothorax. Central airways are widely patent. Musculoskeletal: There is abnormal paraspinal soft tissue swelling extending from T2 through T4.  At the T3/T4 disc space there is endplate irregularity with areas of bony resorption along the right anterolateral aspect of the T4 vertebral body, consistent with discitis/osteomyelitis. There are no acute displaced fractures. Reconstructed images demonstrate no additional findings. Review of the MIP images confirms the above findings. CTA ABDOMEN AND PELVIS FINDINGS VASCULAR Aorta: Normal caliber aorta without aneurysm, dissection, vasculitis or significant stenosis. Mild atherosclerosis. Celiac: Patent without evidence of aneurysm, dissection, vasculitis or significant stenosis. SMA: Patent without evidence of aneurysm, dissection, vasculitis or significant stenosis. Renals: Both renal arteries are patent without evidence of aneurysm, dissection, vasculitis, fibromuscular dysplasia or significant stenosis. IMA: Patent without evidence of aneurysm, dissection, vasculitis or significant stenosis. Inflow: Patent without evidence of aneurysm,  dissection, vasculitis or significant stenosis. Veins: There is a tunneled right femoral PICC line, tip extending to the IVC at the level of the hepatic venous confluence. Review of the MIP images confirms the above findings. NON-VASCULAR Hepatobiliary: Chronic pneumobilia unchanged, consistent with previous biliary duct manipulation. Gallbladder surgically absent. No focal liver abnormalities. Pancreas: Mild pancreatic duct dilation unchanged. The pancreatic parenchyma is unremarkable. Spleen: Normal in size without focal abnormality. Adrenals/Urinary Tract: There is bilateral renal cortical atrophy. No urinary tract calculi or obstructive uropathy. The adrenals and bladder are grossly unremarkable. Stomach/Bowel: Previous postsurgical changes from bowel resections and reanastomosis. No obstruction or ileus. There is diffuse colonic wall thickening, compatible with patient's known history of Crohn disease. Lymphatic: No pathologic adenopathy within the abdomen or pelvis. Reproductive: Status post hysterectomy. No adnexal masses. Other: No free fluid or free gas.  No abdominal wall hernia. Musculoskeletal: No acute or destructive bony lesions. Reconstructed images demonstrate no additional findings. Review of the MIP images confirms the above findings. IMPRESSION: 1. Paraspinal soft tissue swelling and bony irregularities within the upper thoracic spine from T2 through T4, compatible with discitis/osteomyelitis. 2. No evidence of thoracoabdominal aneurysm or dissection. Minimal atherosclerosis is noted. 3. Diffuse colonic wall thickening consistent with colitis, compatible with patient's known history of Crohn disease. Critical Value/emergent results were called by telephone at the time of interpretation on 02/09/2021 at 10:52 pm to provider DR FLOYD, who verbally acknowledged these results. Electronically Signed   By: Randa Ngo M.D.   On: 02/09/2021 22:56    Physical Exam  BP (!) 157/60    Pulse 80    Temp  98.6 F (37 C) (Oral)    Resp 16    SpO2 100%   Physical Exam  ED Course/Procedures     Procedures      Amaryllis Dyke, PA-C 02/10/21 [place], [name], DO 02/10/21 1504

## 2021-02-10 ENCOUNTER — Other Ambulatory Visit: Payer: Self-pay

## 2021-02-10 ENCOUNTER — Encounter (HOSPITAL_COMMUNITY): Payer: Self-pay | Admitting: Internal Medicine

## 2021-02-10 ENCOUNTER — Inpatient Hospital Stay (HOSPITAL_COMMUNITY): Payer: Medicare Other

## 2021-02-10 DIAGNOSIS — N183 Chronic kidney disease, stage 3 unspecified: Secondary | ICD-10-CM | POA: Diagnosis present

## 2021-02-10 DIAGNOSIS — Z820 Family history of epilepsy and other diseases of the nervous system: Secondary | ICD-10-CM | POA: Diagnosis not present

## 2021-02-10 DIAGNOSIS — Z881 Allergy status to other antibiotic agents status: Secondary | ICD-10-CM | POA: Diagnosis not present

## 2021-02-10 DIAGNOSIS — K219 Gastro-esophageal reflux disease without esophagitis: Secondary | ICD-10-CM | POA: Diagnosis present

## 2021-02-10 DIAGNOSIS — M4644 Discitis, unspecified, thoracic region: Secondary | ICD-10-CM | POA: Diagnosis present

## 2021-02-10 DIAGNOSIS — Z789 Other specified health status: Secondary | ICD-10-CM | POA: Diagnosis not present

## 2021-02-10 DIAGNOSIS — I129 Hypertensive chronic kidney disease with stage 1 through stage 4 chronic kidney disease, or unspecified chronic kidney disease: Secondary | ICD-10-CM | POA: Diagnosis present

## 2021-02-10 DIAGNOSIS — Z90710 Acquired absence of both cervix and uterus: Secondary | ICD-10-CM | POA: Diagnosis not present

## 2021-02-10 DIAGNOSIS — Z8614 Personal history of Methicillin resistant Staphylococcus aureus infection: Secondary | ICD-10-CM | POA: Diagnosis not present

## 2021-02-10 DIAGNOSIS — M4624 Osteomyelitis of vertebra, thoracic region: Secondary | ICD-10-CM | POA: Diagnosis present

## 2021-02-10 DIAGNOSIS — Z888 Allergy status to other drugs, medicaments and biological substances status: Secondary | ICD-10-CM | POA: Diagnosis not present

## 2021-02-10 DIAGNOSIS — Z20822 Contact with and (suspected) exposure to covid-19: Secondary | ICD-10-CM | POA: Diagnosis present

## 2021-02-10 DIAGNOSIS — Z9049 Acquired absence of other specified parts of digestive tract: Secondary | ICD-10-CM | POA: Diagnosis not present

## 2021-02-10 DIAGNOSIS — K5080 Crohn's disease of both small and large intestine without complications: Secondary | ICD-10-CM | POA: Diagnosis present

## 2021-02-10 DIAGNOSIS — Z803 Family history of malignant neoplasm of breast: Secondary | ICD-10-CM | POA: Diagnosis not present

## 2021-02-10 DIAGNOSIS — K50819 Crohn's disease of both small and large intestine with unspecified complications: Secondary | ICD-10-CM | POA: Diagnosis not present

## 2021-02-10 DIAGNOSIS — R569 Unspecified convulsions: Secondary | ICD-10-CM | POA: Diagnosis present

## 2021-02-10 DIAGNOSIS — Z87891 Personal history of nicotine dependence: Secondary | ICD-10-CM | POA: Diagnosis not present

## 2021-02-10 DIAGNOSIS — G894 Chronic pain syndrome: Secondary | ICD-10-CM | POA: Diagnosis present

## 2021-02-10 DIAGNOSIS — F32A Depression, unspecified: Secondary | ICD-10-CM | POA: Diagnosis present

## 2021-02-10 DIAGNOSIS — E538 Deficiency of other specified B group vitamins: Secondary | ICD-10-CM | POA: Diagnosis present

## 2021-02-10 DIAGNOSIS — D631 Anemia in chronic kidney disease: Secondary | ICD-10-CM | POA: Diagnosis present

## 2021-02-10 DIAGNOSIS — K912 Postsurgical malabsorption, not elsewhere classified: Secondary | ICD-10-CM

## 2021-02-10 DIAGNOSIS — Z885 Allergy status to narcotic agent status: Secondary | ICD-10-CM | POA: Diagnosis not present

## 2021-02-10 DIAGNOSIS — K529 Noninfective gastroenteritis and colitis, unspecified: Secondary | ICD-10-CM | POA: Diagnosis not present

## 2021-02-10 DIAGNOSIS — M810 Age-related osteoporosis without current pathological fracture: Secondary | ICD-10-CM | POA: Diagnosis present

## 2021-02-10 DIAGNOSIS — Z79891 Long term (current) use of opiate analgesic: Secondary | ICD-10-CM | POA: Diagnosis not present

## 2021-02-10 LAB — C-REACTIVE PROTEIN: CRP: 3.9 mg/dL — ABNORMAL HIGH (ref <1.0)

## 2021-02-10 LAB — COMPREHENSIVE METABOLIC PANEL
ALT: 13 U/L (ref 0–44)
AST: 12 U/L — ABNORMAL LOW (ref 15–41)
Albumin: 2.9 g/dL — ABNORMAL LOW (ref 3.5–5.0)
Alkaline Phosphatase: 124 U/L (ref 38–126)
Anion gap: 9 (ref 5–15)
BUN: 26 mg/dL — ABNORMAL HIGH (ref 6–20)
CO2: 22 mmol/L (ref 22–32)
Calcium: 9.2 mg/dL (ref 8.9–10.3)
Chloride: 106 mmol/L (ref 98–111)
Creatinine, Ser: 1.23 mg/dL — ABNORMAL HIGH (ref 0.44–1.00)
GFR, Estimated: 51 mL/min — ABNORMAL LOW (ref >60)
Glucose, Bld: 79 mg/dL (ref 70–99)
Potassium: 3.5 mmol/L (ref 3.5–5.1)
Sodium: 137 mmol/L (ref 135–145)
Total Bilirubin: 0.8 mg/dL (ref 0.3–1.2)
Total Protein: 6.9 g/dL (ref 6.5–8.1)

## 2021-02-10 LAB — CBC WITH DIFFERENTIAL/PLATELET
Abs Immature Granulocytes: 0.01 10*3/uL (ref 0.00–0.07)
Basophils Absolute: 0 10*3/uL (ref 0.0–0.1)
Basophils Relative: 0 %
Eosinophils Absolute: 0 10*3/uL (ref 0.0–0.5)
Eosinophils Relative: 1 %
HCT: 25.9 % — ABNORMAL LOW (ref 36.0–46.0)
Hemoglobin: 8.4 g/dL — ABNORMAL LOW (ref 12.0–15.0)
Immature Granulocytes: 0 %
Lymphocytes Relative: 34 %
Lymphs Abs: 1.7 10*3/uL (ref 0.7–4.0)
MCH: 29.4 pg (ref 26.0–34.0)
MCHC: 32.4 g/dL (ref 30.0–36.0)
MCV: 90.6 fL (ref 80.0–100.0)
Monocytes Absolute: 0.3 10*3/uL (ref 0.1–1.0)
Monocytes Relative: 6 %
Neutro Abs: 2.9 10*3/uL (ref 1.7–7.7)
Neutrophils Relative %: 59 %
Platelets: 214 10*3/uL (ref 150–400)
RBC: 2.86 MIL/uL — ABNORMAL LOW (ref 3.87–5.11)
RDW: 13.1 % (ref 11.5–15.5)
WBC: 4.9 10*3/uL (ref 4.0–10.5)
nRBC: 0 % (ref 0.0–0.2)

## 2021-02-10 LAB — RESP PANEL BY RT-PCR (FLU A&B, COVID) ARPGX2
Influenza A by PCR: NEGATIVE
Influenza B by PCR: NEGATIVE
SARS Coronavirus 2 by RT PCR: NEGATIVE

## 2021-02-10 LAB — MAGNESIUM: Magnesium: 1.9 mg/dL (ref 1.7–2.4)

## 2021-02-10 LAB — PREALBUMIN: Prealbumin: 20 mg/dL (ref 18–38)

## 2021-02-10 LAB — TRIGLYCERIDES: Triglycerides: 137 mg/dL (ref <150)

## 2021-02-10 LAB — GLUCOSE, CAPILLARY: Glucose-Capillary: 108 mg/dL — ABNORMAL HIGH (ref 70–99)

## 2021-02-10 LAB — SEDIMENTATION RATE: Sed Rate: 101 mm/hr — ABNORMAL HIGH (ref 0–22)

## 2021-02-10 LAB — PHOSPHORUS: Phosphorus: 3 mg/dL (ref 2.5–4.6)

## 2021-02-10 MED ORDER — LOPERAMIDE HCL 2 MG PO CAPS: 2.0000 mg | ORAL_CAPSULE | ORAL | Status: DC | PRN

## 2021-02-10 MED ORDER — ACETAMINOPHEN 325 MG PO TABS
650.0000 mg | ORAL_TABLET | Freq: Four times a day (QID) | ORAL | Status: DC | PRN
  Administered 2021-02-14: 650 mg via ORAL
  Filled 2021-02-10: qty 2

## 2021-02-10 MED ORDER — PROMETHAZINE HCL 25 MG PO TABS
25.0000 mg | ORAL_TABLET | Freq: Four times a day (QID) | ORAL | Status: DC | PRN
  Administered 2021-02-10 – 2021-02-14 (×6): 25 mg via ORAL
  Filled 2021-02-10 (×6): qty 1

## 2021-02-10 MED ORDER — SODIUM CHLORIDE 0.9% FLUSH: 10.0000 mL | INTRAVENOUS | Status: DC | PRN

## 2021-02-10 MED ORDER — CYCLOSPORINE 0.05 % OP EMUL
1.0000 [drp] | Freq: Two times a day (BID) | OPHTHALMIC | Status: DC
  Administered 2021-02-10 – 2021-02-15 (×11): 1 [drp] via OPHTHALMIC
  Filled 2021-02-10 (×11): qty 1

## 2021-02-10 MED ORDER — PANTOPRAZOLE SODIUM 40 MG PO TBEC
40.0000 mg | DELAYED_RELEASE_TABLET | Freq: Every day | ORAL | Status: DC
  Administered 2021-02-10 – 2021-02-15 (×6): 40 mg via ORAL
  Filled 2021-02-10 (×6): qty 1

## 2021-02-10 MED ORDER — PANCRELIPASE (LIP-PROT-AMYL) 12000-38000 UNITS PO CPEP
36000.0000 [IU] | ORAL_CAPSULE | Freq: Three times a day (TID) | ORAL | Status: DC | PRN
  Administered 2021-02-10 – 2021-02-14 (×4): 36000 [IU] via ORAL
  Filled 2021-02-10 (×4): qty 3

## 2021-02-10 MED ORDER — GADOBUTROL 1 MMOL/ML IV SOLN
6.0000 mL | Freq: Once | INTRAVENOUS | Status: AC | PRN
  Administered 2021-02-10: 6 mL via INTRAVENOUS

## 2021-02-10 MED ORDER — VITAMIN D 25 MCG (1000 UNIT) PO TABS
1000.0000 [IU] | ORAL_TABLET | Freq: Every day | ORAL | Status: DC
  Administered 2021-02-10 – 2021-02-15 (×6): 1000 [IU] via ORAL
  Filled 2021-02-10 (×6): qty 1

## 2021-02-10 MED ORDER — CALCIUM 200 MG PO TABS: 200.0000 mg | ORAL_TABLET | Freq: Every day | ORAL | Status: DC

## 2021-02-10 MED ORDER — HYDRALAZINE HCL 20 MG/ML IJ SOLN
10.0000 mg | INTRAMUSCULAR | Status: DC | PRN
  Administered 2021-02-10: 10 mg via INTRAVENOUS
  Filled 2021-02-10 (×2): qty 1

## 2021-02-10 MED ORDER — CHLORHEXIDINE GLUCONATE CLOTH 2 % EX PADS
6.0000 | MEDICATED_PAD | Freq: Every day | CUTANEOUS | Status: DC
  Administered 2021-02-10 – 2021-02-15 (×6): 6 via TOPICAL

## 2021-02-10 MED ORDER — POLYVINYL ALCOHOL 1.4 % OP SOLN
1.0000 [drp] | Freq: Four times a day (QID) | OPHTHALMIC | Status: DC
  Administered 2021-02-10 – 2021-02-15 (×20): 1 [drp] via OPHTHALMIC
  Filled 2021-02-10: qty 15

## 2021-02-10 MED ORDER — HYDRALAZINE HCL 50 MG PO TABS
75.0000 mg | ORAL_TABLET | Freq: Three times a day (TID) | ORAL | Status: DC
  Administered 2021-02-10 – 2021-02-15 (×16): 75 mg via ORAL
  Filled 2021-02-10 (×16): qty 1

## 2021-02-10 MED ORDER — VANCOMYCIN HCL 1000 MG/200ML IV SOLN
1000.0000 mg | INTRAVENOUS | Status: DC
  Administered 2021-02-11 – 2021-02-12 (×2): 1000 mg via INTRAVENOUS
  Filled 2021-02-10 (×3): qty 200

## 2021-02-10 MED ORDER — VANCOMYCIN HCL 1250 MG/250ML IV SOLN
1250.0000 mg | Freq: Once | INTRAVENOUS | Status: AC
  Administered 2021-02-10: 1250 mg via INTRAVENOUS
  Filled 2021-02-10: qty 250

## 2021-02-10 MED ORDER — AMLODIPINE BESYLATE 10 MG PO TABS
10.0000 mg | ORAL_TABLET | Freq: Every day | ORAL | Status: DC
  Administered 2021-02-10 – 2021-02-15 (×6): 10 mg via ORAL
  Filled 2021-02-10 (×6): qty 1

## 2021-02-10 MED ORDER — INSULIN ASPART 100 UNIT/ML ~~LOC~~ SOLN
0.0000 [IU] | Freq: Three times a day (TID) | SUBCUTANEOUS | Status: DC
  Administered 2021-02-12: 1 [IU] via SUBCUTANEOUS

## 2021-02-10 MED ORDER — TRAVASOL 10 % IV SOLN
INTRAVENOUS | Status: AC
  Filled 2021-02-10: qty 990

## 2021-02-10 MED ORDER — SUCRALFATE 1 GM/10ML PO SUSP
1.0000 g | Freq: Three times a day (TID) | ORAL | Status: DC
  Administered 2021-02-10 – 2021-02-15 (×22): 1 g via ORAL
  Filled 2021-02-10 (×22): qty 10

## 2021-02-10 MED ORDER — VITAMIN B-12 1000 MCG PO TABS
1000.0000 ug | ORAL_TABLET | Freq: Every day | ORAL | Status: DC
  Administered 2021-02-10 – 2021-02-15 (×6): 1000 ug via ORAL
  Filled 2021-02-10 (×6): qty 1

## 2021-02-10 MED ORDER — DIPHENOXYLATE-ATROPINE 2.5-0.025 MG PO TABS
1.0000 | ORAL_TABLET | Freq: Four times a day (QID) | ORAL | Status: DC | PRN
  Administered 2021-02-12: 1 via ORAL
  Filled 2021-02-10: qty 1

## 2021-02-10 MED ORDER — ACETAMINOPHEN 650 MG RE SUPP: 650.0000 mg | Freq: Four times a day (QID) | RECTAL | Status: DC | PRN

## 2021-02-10 MED ORDER — CALCIUM CARBONATE ANTACID 500 MG PO CHEW
1.0000 | CHEWABLE_TABLET | Freq: Every day | ORAL | Status: DC
  Administered 2021-02-10 – 2021-02-15 (×6): 200 mg via ORAL
  Filled 2021-02-10 (×6): qty 1

## 2021-02-10 MED ORDER — SODIUM CHLORIDE 0.9 % IV SOLN
2.0000 g | Freq: Two times a day (BID) | INTRAVENOUS | Status: DC
  Administered 2021-02-10 – 2021-02-15 (×10): 2 g via INTRAVENOUS
  Filled 2021-02-10 (×10): qty 2

## 2021-02-10 MED ORDER — LORAZEPAM 2 MG/ML IJ SOLN
1.0000 mg | Freq: Once | INTRAMUSCULAR | Status: AC | PRN
  Administered 2021-02-10: 1 mg via INTRAVENOUS
  Filled 2021-02-10: qty 1

## 2021-02-10 MED ORDER — HYDROMORPHONE HCL 1 MG/ML IJ SOLN
0.5000 mg | INTRAMUSCULAR | Status: DC | PRN
  Administered 2021-02-10 – 2021-02-11 (×13): 0.5 mg via INTRAVENOUS
  Filled 2021-02-10 (×13): qty 0.5

## 2021-02-10 MED ORDER — METHADONE HCL 10 MG PO TABS
5.0000 mg | ORAL_TABLET | Freq: Every day | ORAL | Status: DC
  Administered 2021-02-10 – 2021-02-15 (×28): 5 mg via ORAL
  Filled 2021-02-10 (×28): qty 1

## 2021-02-10 MED ORDER — METOPROLOL TARTRATE 50 MG PO TABS
100.0000 mg | ORAL_TABLET | Freq: Two times a day (BID) | ORAL | Status: DC
  Administered 2021-02-10 – 2021-02-15 (×11): 100 mg via ORAL
  Filled 2021-02-10 (×11): qty 2

## 2021-02-10 MED ORDER — BUDESONIDE 3 MG PO CPEP
9.0000 mg | ORAL_CAPSULE | ORAL | Status: DC
  Administered 2021-02-10 – 2021-02-14 (×3): 9 mg via ORAL
  Filled 2021-02-10 (×3): qty 3

## 2021-02-10 MED ORDER — LEVETIRACETAM 500 MG PO TABS
500.0000 mg | ORAL_TABLET | Freq: Two times a day (BID) | ORAL | Status: DC
  Administered 2021-02-10 – 2021-02-15 (×11): 500 mg via ORAL
  Filled 2021-02-10 (×11): qty 1

## 2021-02-10 MED ORDER — DEXTRAN 70-HYPROMELLOSE 0.1-0.3 % OP SOLN: 1.0000 [drp] | Freq: Four times a day (QID) | OPHTHALMIC | Status: DC

## 2021-02-10 MED ORDER — DULOXETINE HCL 60 MG PO CPEP
90.0000 mg | ORAL_CAPSULE | Freq: Every day | ORAL | Status: DC
  Administered 2021-02-10 – 2021-02-15 (×6): 90 mg via ORAL
  Filled 2021-02-10 (×6): qty 1

## 2021-02-10 NOTE — Progress Notes (Signed)
Initial Nutrition Assessment  DOCUMENTATION CODES:   Not applicable  INTERVENTION:  - cyclic TPN per Pharmacist. - will liberalize diet from Heart Healthy to Regular.  - weigh patient today.   NUTRITION DIAGNOSIS:   Inadequate oral intake related to altered GI function as evidenced by per patient/family report,other (comment) (need for home TPN).  GOAL:   Patient will meet greater than or equal to 90% of their needs  MONITOR:   PO intake,Labs,Weight trends,Other (Comment) (TPN regimen)  REASON FOR ASSESSMENT:   Consult New TPN/TNA  ASSESSMENT:   60 y.o. female with medical history of Crohn's disease, short gut syndrome on TPN home TPN, and HTN. She presented to the ED due to worsening upper back pain for the last 1 week.  No intakes documented since admission. Patient has a tunneled R femoral CVC; last replaced on 08/05/20. She is on home TPN since 2003.   Patient lives with her daughter. She does eat and drink but consumes very little orally. She often drinks two 16 oz bottles of water/day. She eats 0-2 times/day, tablespoon portions each of a meat (usually chicken or [place]), a vegetable or starch, and a dessert. She is unable to tolerate spicy items and prefers liquids to be room temperature.   She reports weight has been fluctuating between 138-144 lb and that at this time she weighs 138 lb.   She has not been weighed since 2/25.  Per notes: - thoracic discitis - uncontrolled HTN   Labs reviewed; BUN: 26 mg/dl, creatinine: 1.23 mg/dl, GFR: 51 ml/min, triglycerides: 137 mg/dl. Medications reviewed; 1000 units cholecalciferol/day, 36000 units creon TID PRN, 40 mg oral protonix/day, 1 g carafate TID, 1000 mcg oral cyanocobalamin/day.     NUTRITION - FOCUSED PHYSICAL EXAM:  completed; no muscle depletions or fat depletions.  Diet Order:   Diet Order            Diet regular Room service appropriate? Yes; Fluid consistency: Thin  Diet effective now                  EDUCATION NEEDS:   No education needs have been identified at this time  Skin:  Skin Assessment: Reviewed RN Assessment  Last BM:  3/17 (type 6 x1)  Height:   Ht Readings from Last 1 Encounters:  01/04/21 5' 7"  (1.702 m)    Weight:   Wt Readings from Last 1 Encounters:  01/20/21 62.6 kg     Estimated Nutritional Needs:  Kcal:  3567-0141 kcal Protein:  95-105 grams Fluid:  >/= 2 L/day      Jarome Matin, MS, RD, LDN, CNSC Inpatient Clinical Dietitian RD pager # available in [facility]  After hours/weekend pager # available in [facility]

## 2021-02-10 NOTE — Progress Notes (Signed)
Mri reviewed.  Evidence of discitis and osteomyelitis T2-T5 with paravertebral and ventral epidural phlegmon without frank abscess or significant canal stenosis whatsoever.  As before, recommend IR biopsy for tissue and antibiotics per infectious disease recommendations.  There is no needed intervention from neurosurgery standpoint at this point.  Please call with questions or concerns    Elwin Sleight, DO Neurosurgeon

## 2021-02-10 NOTE — Plan of Care (Signed)
  Problem: Education: Goal: Knowledge of General Education information will improve Description: Including pain rating scale, medication(s)/side effects and non-pharmacologic comfort measures Outcome: Progressing   Problem: Clinical Measurements: Goal: Diagnostic test results will improve Outcome: Progressing   Problem: Activity: Goal: Risk for activity intolerance will decrease Outcome: Progressing

## 2021-02-10 NOTE — Progress Notes (Signed)
Called from ED about CT findings concerning for osteodiscitis T2-4  Recommended MRI w/w/o and infectious workup and medical admission  Rec IR biopsy once MRI completed  Neuro stable, no focal deficits per ED provider   Elwin Sleight, DO Neurosurgeon

## 2021-02-10 NOTE — Progress Notes (Signed)
Received pt from ED to room 1337. Pt a/ox4, pt in no distress, rates pain level 9/10 in her back. BP elevated, 186/70. Notified on call MD, see new orders. Patient oriented to room and the use of the call bell. All items placed in reach.

## 2021-02-10 NOTE — Progress Notes (Signed)
Pharmacy Antibiotic Note  Caitlin Peters is a 60 y.o. female admitted on 02/09/2021 with discitis/osteomyelitis of T2-T5. Pharmacy has been consulted for vancomycin dosing. We will also start cefepime due to [name]'s history of bacteremia with various gram negative rods including enterobacter cloacae. Notably she most recently grew MRSE in the blood in January 2022.    Noted that [name] had neurotoxicity to cefepime in the past but this was likely related to reduced kidney function. She has been on cefepime several times since the reported neurotoxicity with no reported adverse events. SCr- 1.23 today with CrCl ~ 48 ml/min.   Plan: Vancomycin 1250 mg X 1 Then 1000 mg every 24 hours Predicted AUC 466 with Scr 1.23. Cmin ~11-12 Cefepime 2 gm every 12 hours  Monitor renal function and vancomycin levels at steady state as appropriate  Weight: 67.5 kg (148 lb 13 oz)  Temp (24hrs), Avg:98.5 F (36.9 C), Min:98 F (36.7 C), Max:99 F (37.2 C)  Recent Labs  Lab 02/09/21 1928 02/10/21 0317  WBC 5.8 4.9  CREATININE 1.24* 1.23*  LATICACIDVEN 1.0  --     Estimated Creatinine Clearance: 47.9 mL/min (A) (by C-G formula based on SCr of 1.23 mg/dL (H)).    Allergies  Allergen Reactions  . Meperidine Hives    Other reaction(s): GI Upset Due to [place]   . Hyoscyamine Hives and Swelling    Legs swelling   Disorientation  . Cefepime Other (See Comments)    Neurotoxicity occurring in setting of AKI. Ceftriaxone tolerated during same admit  . Gabapentin Other (See Comments)    unknown  . Lyrica [Pregabalin] Other (See Comments)    unknown  . Topamax [Topiramate] Other (See Comments)    unknown  . Zosyn [Piperacillin Sod-Tazobactam So]     Patient reports it makes her vomit, her neck stiff, and her "heart feel funny"  . Fentanyl Rash    Pt is allergic to fentanyl patch related to the glue (gives her a rash) Pt states she is NOT allergic to fentanyl IV medicine  . Morphine  And Related Rash    Thank you for allowing pharmacy to be a part of this patient's care.  Jimmy Footman, PharmD, BCPS, [place] [facility] Clinical Pharmacist Phone: [phone] 02/10/2021 3:51 PM

## 2021-02-10 NOTE — Progress Notes (Signed)
PROGRESS NOTE    Caitlin Peters  STM:[id number] DOB: 07/30/1961 DOA: 02/09/2021 PCP: [name], [name], MD   Brief Narrative: Caitlin Peters is a 60 y.o. 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 Patient presented secondary to back pain with evidence of discitis/osteoyelitis on imaging.   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 Complicated by PICC line for TPN. No antibiotics started on admission. Discussed with IR and no role for aspiration in this clinical scenario secondary to location of infection and lack of adequate fluid collection. Neurosurgery signed off. Blood cultures obtained on admission and are pending -Infectious disease consulted -Follow blood cultures  Primary hypertension -Continue amlodipine, hydralazine, metoprolol  Chronic pain Patient follows in a pain clinic. -Continue methadone 25 mg daily and Cymbalta 90 mg daily  History of Crohn's disease Follows at [facility]. On budesonide as an outpatient -Continue budesonide  CKD stage III Stable.  Short gut syndrome Secondary to surgeries for above. Currently with a right femoral PICC line for TPN. -Continue TPN pending blood culture results  GERD -Continue Protonix  Seizures -Continue Keppra   DVT prophylaxis: SCDs Code Status:   Code Status: Full Code Family Communication: None at bedside Disposition Plan: Discharge home likely in several days pending ID recommendations   Consultants:   Neurosurgery  Infectious disease  Procedures:   None  Antimicrobials:  None    Subjective: Back pain. Nausea. No other issues.  Objective: Vitals:    02/10/21 0208 02/10/21 0502 02/10/21 1024 02/10/21 1335  BP: (!) 186/70 (!) 165/80 (!) 147/85 (!) 188/78  Pulse: 68 82 66 64  Resp: 17 16 18 15   Temp: 99 F (37.2 C) 98.8 F (37.1 C) 98.4 F (36.9 C) 98.3 F (36.8 C)  TempSrc: Oral Oral    SpO2: 100% 99% 99% 100%    Intake/Output Summary (Last 24 hours) at 02/10/2021 1406 Last data filed at 02/10/2021 1300 Gross per 24 hour  Intake 1220 ml  Output 1100 ml  Net 120 ml   There were no vitals filed for this visit.  Examination:  General exam: Appears calm and comfortable and in no acute distress. Conversant Respiratory: Clear to auscultation. Respiratory effort normal with no intercostal retractions or use of accessory muscles Cardiovascular: S1 & S2 heard, Normal rate with regular rhythm with extra beat. No murmurs, rubs, gallops or clicks. No edema Gastrointestinal: Abdomen is nondistended, soft and tender on left side. No masses felt. Decreased bowel sounds heard Neurologic: No focal neurological deficits Musculoskeletal: No calf tenderness Skin: No cyanosis. No new rashes Psychiatry: Alert and oriented. Memory intact. Mood & affect appropriate    Data Reviewed: I have personally reviewed following labs and imaging studies  CBC Lab Results  Component Value Date   WBC 4.9 02/10/2021   RBC 2.86 (L) 02/10/2021   HGB 8.4 (L) 02/10/2021   HCT 25.9 (L) 02/10/2021   MCV 90.6 02/10/2021   MCH 29.4 02/10/2021   PLT 214 02/10/2021   MCHC 32.4 02/10/2021   RDW 13.1 02/10/2021   LYMPHSABS 1.7 02/10/2021   MONOABS 0.3 02/10/2021   EOSABS 0.0 02/10/2021   BASOSABS 0.0 02/10/2021  Last metabolic panel Lab Results  Component Value Date   NA 137 02/10/2021   K 3.5 02/10/2021   CL 106 02/10/2021   CO2 22 02/10/2021   BUN 26 (H) 02/10/2021   CREATININE 1.23 (H) 02/10/2021   GLUCOSE 79 02/10/2021   GFRNONAA 51 (L) 02/10/2021   GFRAA 55 (L) 08/12/2020   CALCIUM 9.2 02/10/2021   PHOS 3.0 02/10/2021   PROT 6.9  02/10/2021   ALBUMIN 2.9 (L) 02/10/2021   BILITOT 0.8 02/10/2021   ALKPHOS 124 02/10/2021   AST 12 (L) 02/10/2021   ALT 13 02/10/2021   ANIONGAP 9 02/10/2021    CBG (last 3)  No results for input(s): GLUCAP in the last 72 hours.   GFR: CrCl cannot be calculated (Unknown ideal weight.).  Coagulation Profile: No results for input(s): INR, PROTIME in the last 168 hours.  Recent Results (from the past 240 hour(s))  Resp Panel by RT-PCR (Flu A&B, Covid) Nasopharyngeal Swab     Status: None   Collection Time: 02/10/21 12:16 AM   Specimen: Nasopharyngeal Swab; Nasopharyngeal(NP) swabs in vial transport medium  Result Value Ref Range Status   SARS Coronavirus 2 by RT PCR NEGATIVE NEGATIVE Final    Comment: (NOTE) SARS-CoV-2 target nucleic acids are NOT DETECTED.  The SARS-CoV-2 RNA is generally detectable in upper respiratory specimens during the acute phase of infection. The lowest concentration of SARS-CoV-2 viral copies this assay can detect is 138 copies/mL. A negative result does not preclude SARS-Cov-2 infection and should not be used as the sole basis for treatment or other patient management decisions. A negative result may occur with  improper specimen collection/handling, submission of specimen other than nasopharyngeal swab, presence of viral mutation(s) within the areas targeted by this assay, and inadequate number of viral copies(<138 copies/mL). A negative result must be combined with clinical observations, patient history, and epidemiological information. The expected result is Negative.  Fact Sheet for Patients:  EntrepreneurPulse.com.au  Fact Sheet for Healthcare Providers:  IncredibleEmployment.be  This test is no t yet approved or cleared by the [place] FDA and  has been authorized for detection and/or diagnosis of SARS-CoV-2 by FDA under an Emergency Use Authorization (EUA). This EUA will remain  in effect (meaning  this test can be used) for the duration of the COVID-19 declaration under Section 564(b)(1) of the Act, 21 U.S.C.section 360bbb-3(b)(1), unless the authorization is terminated  or revoked sooner.       Influenza A by PCR NEGATIVE NEGATIVE Final   Influenza B by PCR NEGATIVE NEGATIVE Final    Comment: (NOTE) The Xpert Xpress SARS-CoV-2/FLU/RSV plus assay is intended as an aid in the diagnosis of influenza from Nasopharyngeal swab specimens and should not be used as a sole basis for treatment. Nasal washings and aspirates are unacceptable for Xpert Xpress SARS-CoV-2/FLU/RSV testing.  Fact Sheet for Patients: EntrepreneurPulse.com.au  Fact Sheet for Healthcare Providers: IncredibleEmployment.be  This test is not yet approved or cleared by the [place] FDA and has been authorized for detection and/or diagnosis of SARS-CoV-2 by FDA under an Emergency Use Authorization (EUA). This EUA will remain in effect (meaning this test can be used) for the duration of the COVID-19 declaration under Section 564(b)(1) of the Act, 21 U.S.C. section 360bbb-3(b)(1), unless the authorization is terminated or revoked.  Performed at [facility], [place] [address], [place], [place] [address]         Radiology Studies: MR THORACIC SPINE W WO CONTRAST  Result Date: 02/10/2021 CLINICAL  DATA:  Mid back pain, abnormal CT EXAM: MRI THORACIC WITHOUT AND WITH CONTRAST TECHNIQUE: Multiplanar and multiecho pulse sequences of the thoracic spine were obtained without and with intravenous contrast. CONTRAST:  75m GADAVIST GADOBUTROL 1 MMOL/ML IV SOLN COMPARISON:  None. FINDINGS: Alignment:  Preserved. Vertebrae: There is abnormal marrow signal and enhancement at T3 and T4 with endplate irregularity prior CT. There is also abnormal signal at the posterior aspect of the T2 vertebral body and along the superior T5 endplate. Cord:  No abnormal signal. Paraspinal  and other soft tissues: Paraspinal edema and enhancement centered at the above levels. No evidence of abscess. Disc levels: Mild T2 hyperintensity and enhancement within the T3-T4 and T4-T5 disc spaces. There is ventral epidural enhancement from T2-T3 to T4-T5 without significant canal stenosis. IMPRESSION: Evidence of discitis/osteomyelitis from T2-T5. Paravertebral and ventral epidural phlegmon. No significant canal stenosis. No abscess. Electronically Signed   By: PMacy MisM.D.   On: 02/10/2021 11:46   DG Chest Port 1 View  Result Date: 02/09/2021 CLINICAL DATA:  Chest pain. EXAM: PORTABLE CHEST 1 VIEW COMPARISON:  December 18, 2020. FINDINGS: The heart size and mediastinal contours are within normal limits. Both lungs are clear. No pneumothorax or pleural effusion is noted. The visualized skeletal structures are unremarkable. IMPRESSION: No active disease. Electronically Signed   By: JMarijo ConceptionM.D.   On: 02/09/2021 19:15   CT Angio Chest/Abd/Pel for Dissection W and/or Wo Contrast  Result Date: 02/09/2021 CLINICAL DATA:  Bilateral shoulder and abdominal pain EXAM: CT ANGIOGRAPHY CHEST, ABDOMEN AND PELVIS TECHNIQUE: Non-contrast CT of the chest was initially obtained. Multidetector CT imaging through the chest, abdomen and pelvis was performed using the standard protocol during bolus administration of intravenous contrast. Multiplanar reconstructed images and MIPs were obtained and reviewed to evaluate the vascular anatomy. CONTRAST:  1072mOMNIPAQUE IOHEXOL 350 MG/ML SOLN COMPARISON:  12/15/2020 FINDINGS: CTA CHEST FINDINGS Cardiovascular: No evidence of thoracic aortic aneurysm or dissection. The heart is unremarkable without pericardial effusion. No evidence of pulmonary embolus. Mediastinum/Nodes: No pathologic adenopathy. Thyroid is heterogeneous but not enlarged. Trachea and esophagus are unremarkable. Lungs/Pleura: No airspace disease, effusion, or pneumothorax. Central airways are  widely patent. Musculoskeletal: There is abnormal paraspinal soft tissue swelling extending from T2 through T4. At the T3/T4 disc space there is endplate irregularity with areas of bony resorption along the right anterolateral aspect of the T4 vertebral body, consistent with discitis/osteomyelitis. There are no acute displaced fractures. Reconstructed images demonstrate no additional findings. Review of the MIP images confirms the above findings. CTA ABDOMEN AND PELVIS FINDINGS VASCULAR Aorta: Normal caliber aorta without aneurysm, dissection, vasculitis or significant stenosis. Mild atherosclerosis. Celiac: Patent without evidence of aneurysm, dissection, vasculitis or significant stenosis. SMA: Patent without evidence of aneurysm, dissection, vasculitis or significant stenosis. Renals: Both renal arteries are patent without evidence of aneurysm, dissection, vasculitis, fibromuscular dysplasia or significant stenosis. IMA: Patent without evidence of aneurysm, dissection, vasculitis or significant stenosis. Inflow: Patent without evidence of aneurysm, dissection, vasculitis or significant stenosis. Veins: There is a tunneled right femoral PICC line, tip extending to the IVC at the level of the hepatic venous confluence. Review of the MIP images confirms the above findings. NON-VASCULAR Hepatobiliary: Chronic pneumobilia unchanged, consistent with previous biliary duct manipulation. Gallbladder surgically absent. No focal liver abnormalities. Pancreas: Mild pancreatic duct dilation unchanged. The pancreatic parenchyma is unremarkable. Spleen: Normal in size without focal abnormality. Adrenals/Urinary Tract: There is bilateral renal cortical atrophy. No urinary tract calculi or obstructive uropathy.  The adrenals and bladder are grossly unremarkable. Stomach/Bowel: Previous postsurgical changes from bowel resections and reanastomosis. No obstruction or ileus. There is diffuse colonic wall thickening, compatible with  patient's known history of Crohn disease. Lymphatic: No pathologic adenopathy within the abdomen or pelvis. Reproductive: Status post hysterectomy. No adnexal masses. Other: No free fluid or free gas.  No abdominal wall hernia. Musculoskeletal: No acute or destructive bony lesions. Reconstructed images demonstrate no additional findings. Review of the MIP images confirms the above findings. IMPRESSION: 1. Paraspinal soft tissue swelling and bony irregularities within the upper thoracic spine from T2 through T4, compatible with discitis/osteomyelitis. 2. No evidence of thoracoabdominal aneurysm or dissection. Minimal atherosclerosis is noted. 3. Diffuse colonic wall thickening consistent with colitis, compatible with patient's known history of Crohn disease. Critical Value/emergent results were called by telephone at the time of interpretation on 02/09/2021 at 10:52 pm to provider DR FLOYD, who verbally acknowledged these results. Electronically Signed   By: Randa Ngo M.D.   On: 02/09/2021 22:56        Scheduled Meds: . amLODipine  10 mg Oral Daily  . budesonide  9 mg Oral QODAY  . calcium carbonate  1 tablet Oral Daily  . Chlorhexidine Gluconate Cloth  6 each Topical Daily  . cholecalciferol  1,000 Units Oral Daily  . cycloSPORINE  1 drop Both Eyes BID  . DULoxetine  90 mg Oral Daily  . hydrALAZINE  75 mg Oral TID  . levETIRAcetam  500 mg Oral BID  . methadone  5 mg Oral 5 X Daily  . metoprolol tartrate  100 mg Oral BID  . pantoprazole  40 mg Oral Daily  . polyvinyl alcohol  1 drop Both Eyes QID  . sucralfate  1 g Oral TID WC & HS  . vitamin B-12  1,000 mcg Oral Daily   Continuous Infusions: . TPN CYCLIC-ADULT (ION)       LOS: 0 days     Cordelia Poche, MD [facility] 02/10/2021, 2:06 PM  If 7PM-7AM, please contact night-coverage [email or web address]

## 2021-02-10 NOTE — ED Notes (Signed)
ED TO INPATIENT HANDOFF REPORT  Name/Age/Gender Caitlin Peters 60 y.o. female  Code Status Code Status History    Date Active Date Inactive Code Status Order ID Comments User Context   12/15/2020 2109 12/21/2020 2327 Full Code [id number]  Etta Quill, DO ED   12/06/2020 0335 12/08/2020 1803 Full Code [id number]  Chotiner, Yevonne Aline, MD ED   11/07/2020 0249 11/10/2020 2031 Full Code [id number]  Rise Patience, MD ED   10/21/2020 1308 10/22/2020 1502 Full Code [id number]  Harold Hedge, MD ED   09/01/2020 0401 09/13/2020 0025 Full Code [id number]  Rise Patience, MD ED   08/03/2020 0322 08/12/2020 2234 Full Code [id number]  Shela Leff, MD ED   07/19/2020 1714 07/23/2020 2143 Full Code [id number]  Mercy Riding, MD ED   06/27/2020 2115 07/02/2020 2253 Full Code [id number]  Orene Desanctis, DO ED   05/05/2020 2119 05/10/2020 2314 Full Code [id number]  Lenore Cordia, MD ED   04/13/2020 1819 04/18/2020 2333 Full Code [id number]  Damita Lack, MD Inpatient   03/05/2020 1111 03/11/2020 2310 Full Code [id number]  Karmen Bongo, MD Inpatient   01/02/2020 1248 01/11/2020 2339 Full Code [id number]  Norval Morton, MD ED   11/25/2019 2257 11/30/2019 2333 Full Code [id number]  Edmonia Lynch, DO ED   10/28/2019 0815 10/31/2019 2114 Full Code [id number]  Norval Morton, MD ED   10/12/2019 2352 10/14/2019 1618 Full Code [id number]  Nicolette Bang, DO ED   Advance Care Planning Activity    Questions for Most Recent Historical Code Status (Order [id number])       Home/SNF/Other Home  Chief Complaint Discitis [M46.40]  Level of Care/Admitting Diagnosis ED Disposition    ED Disposition Condition [facility]: [facility] [[id number]]  Level of Care: Med-Surg [16]  May admit patient to [facility] or [facility] if equivalent level of care is available:: No  Covid Evaluation: Asymptomatic Screening Protocol (No Symptoms)  Diagnosis: Discitis [093818]   Admitting Physician: Rise Patience [phone]  Attending Physician: Rise Patience [phone]  Estimated length of stay: past midnight tomorrow  Certification:: I certify this patient will need inpatient services for at least 2 midnights       Medical History Past Medical History:  Diagnosis Date  . Acute pancreatitis 04/13/2020  . Anasarca 10/2019  . AVN (avascular necrosis of bone) ([place])   . Cataract   . Choledocholithiasis (sludge) s/p ERCP 10/2019 10/21/2020  . Chronic pain syndrome   . CKD (chronic kidney disease), stage III ([place])   . Crohn disease ([place])   . Crohn's disease of small & large intestine with SGS 1984   Caitlin Peters is a 60year old female with a history of Crohn's disease diagnosed in 27 (age 47), history of long-term steroid use with osteoporosis, S/P multiple bowel resections ([phone]) complicated by chronic back and abdominal pain, steatorrhea and short bowel syndrome. The patient has been left with ~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 attached to proximal transverse colon through rectum. She has been  . Depression   . Diverticulosis   . GERD (gastroesophageal reflux disease)   . HTN (hypertension)   . IDA (iron deficiency anemia)   . Malnutrition ([place])   . Mass in chest   . Osteoporosis   . Osteoporosis 12/24/2014  . Pancreatitis   . 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 Vitamin B12 deficiency     Allergies Allergies  Allergen Reactions  . Meperidine Hives    Other reaction(s): GI Upset Due to [place]   . Hyoscyamine Hives and Swelling    Legs swelling   Disorientation  . Cefepime Other (See Comments)    Neurotoxicity occurring in setting of AKI. Ceftriaxone tolerated during same admit  . Gabapentin  Other (See Comments)    unknown  . Lyrica [Pregabalin] Other (See Comments)    unknown  . Topamax [Topiramate] Other (See Comments)    unknown  . Zosyn [Piperacillin Sod-Tazobactam So]     Patient reports it makes her vomit, her neck stiff, and her "heart feel funny"  . Fentanyl Rash    Pt is allergic to fentanyl patch related to the glue (gives her a rash) Pt states she is NOT allergic to fentanyl IV medicine  . Morphine And Related Rash    IV Location/Drains/Wounds Patient Lines/Drains/Airways Status    Active Line/Drains/Airways    Name Placement date Placement time Site Days   Tunneled CVC Double Lumen (Radiology) 08/05/20 Right Femoral 45 cm 08/05/20  1406  -- 189          Labs/Imaging Results for orders placed or performed during the hospital encounter of 02/09/21 (from the past 48 hour(s))  Basic metabolic panel     Status: Abnormal   Collection Time: 02/09/21  7:28 PM  Result Value Ref Range   Sodium 140 135 - 145 mmol/L   Potassium 3.6 3.5 - 5.1 mmol/L   Chloride 108 98 - 111 mmol/L   CO2 21 (L) 22 - 32 mmol/L   Glucose, Bld 84 70 - 99 mg/dL    Comment: Glucose reference range applies only to samples taken after fasting for at least 8 hours.   BUN 29 (H) 6 - 20 mg/dL   Creatinine, Ser 1.24 (H) 0.44 - 1.00 mg/dL   Calcium 9.6 8.9 - 10.3 mg/dL   GFR, Estimated 50 (L) >60 mL/min    Comment: (NOTE) Calculated using the CKD-EPI Creatinine Equation (2021)    Anion gap 11 5 - 15    Comment: Performed at [facility], [place] [address] [place], [place] [address]  CBC     Status: Abnormal   Collection Time: 02/09/21  7:28 PM  Result Value Ref Range   WBC 5.8 4.0 - 10.5 K/uL   RBC 3.07 (L) 3.87 - 5.11 MIL/uL   Hemoglobin 8.9 (L) 12.0 - 15.0 g/dL   HCT 27.7 (L) 36.0 - 46.0 %   MCV 90.2 80.0 - 100.0 fL   MCH 29.0 26.0 - 34.0 pg   MCHC 32.1 30.0 - 36.0 g/dL   RDW 13.2 11.5 - 15.5 %   Platelets 235 150 - 400 K/uL   nRBC 0.0 0.0 - 0.2 %    Comment:  Performed at [facility], [place] [address] [place], [place] [address]  Troponin I (High Sensitivity)     Status: None   Collection Time: 02/09/21  7:28 PM  Result Value Ref Range   Troponin I (High Sensitivity) 4 <18 ng/L    Comment: (NOTE) Elevated high sensitivity troponin I (hsTnI) values and significant  changes across serial measurements may suggest ACS but many other  chronic and acute conditions are known to elevate hsTnI  results.  Refer to the "Links" section for chest pain algorithms and additional  guidance. Performed at [facility], [place] [address], [place], [place] [address]   Hepatic function panel     Status: Abnormal   Collection Time: 02/09/21  7:28 PM  Result Value Ref Range   Total Protein 7.5 6.5 - 8.1 g/dL   Albumin 3.3 (L) 3.5 - 5.0 g/dL   AST 15 15 - 41 U/L   ALT 14 0 - 44 U/L   Alkaline Phosphatase 134 (H) 38 - 126 U/L   Total Bilirubin 1.0 0.3 - 1.2 mg/dL   Bilirubin, Direct 0.1 0.0 - 0.2 mg/dL   Indirect Bilirubin 0.9 0.3 - 0.9 mg/dL    Comment: Performed at [facility], [place] [address] [place], [place] [address]  Lipase, blood     Status: None   Collection Time: 02/09/21  7:28 PM  Result Value Ref Range   Lipase 25 11 - 51 U/L    Comment: Performed at [facility], [place] [address], [place], [place] [address]  Lactic acid, plasma     Status: None   Collection Time: 02/09/21  7:28 PM  Result Value Ref Range   Lactic Acid, Venous 1.0 0.5 - 1.9 mmol/L    Comment: Performed at [facility], [place] [address] [place], [place] [address]  Urinalysis, Routine w reflex microscopic Urine, Clean Catch     Status: Abnormal   Collection Time: 02/09/21  8:35 PM  Result Value Ref Range   Color, Urine YELLOW YELLOW   APPearance HAZY (A) CLEAR   Specific Gravity, Urine 1.033 (H) 1.005 - 1.030   pH 5.0 5.0 - 8.0   Glucose, UA NEGATIVE NEGATIVE mg/dL   Hgb urine dipstick  NEGATIVE NEGATIVE   Bilirubin Urine NEGATIVE NEGATIVE   Ketones, ur 5 (A) NEGATIVE mg/dL   Protein, ur >=300 (A) NEGATIVE mg/dL   Nitrite NEGATIVE NEGATIVE   Leukocytes,Ua NEGATIVE NEGATIVE   RBC / HPF 0-5 0 - 5 RBC/hpf   WBC, UA 0-5 0 - 5 WBC/hpf   Bacteria, UA RARE (A) NONE SEEN   Squamous Epithelial / LPF 11-20 0 - 5   Mucus PRESENT     Comment: Performed at [facility], [place] [address], [place], [place] [address]  Troponin I (High Sensitivity)     Status: None   Collection Time: 02/09/21 10:00 PM  Result Value Ref Range   Troponin I (High Sensitivity) 6 <18 ng/L    Comment: (NOTE) Elevated high sensitivity troponin I (hsTnI) values and significant  changes across serial measurements may suggest ACS but many other  chronic and acute conditions are known to elevate hsTnI results.  Refer to the "Links" section for chest pain algorithms and additional  guidance. Performed at [facility], [place] [address], [place], [place] [address]   C-reactive protein     Status: Abnormal   Collection Time: 02/09/21 11:52 PM  Result Value Ref Range   CRP 3.9 (H) <1.0 mg/dL    Comment: Performed at [facility], [place] [address], [place],  [address]   DG Chest Port 1 View  Result Date: 02/09/2021 CLINICAL DATA:  Chest pain. EXAM: PORTABLE CHEST 1 VIEW COMPARISON:  December 18, 2020. FINDINGS: The heart size and mediastinal contours are within normal limits. Both lungs are clear. No pneumothorax or pleural effusion is noted. The visualized skeletal structures are unremarkable. IMPRESSION: No active disease. Electronically  Signed   By: Marijo Conception M.D.   On: 02/09/2021 19:15   CT Angio Chest/Abd/Pel for Dissection W and/or Wo Contrast  Result Date: 02/09/2021 CLINICAL DATA:  Bilateral shoulder and abdominal pain EXAM: CT ANGIOGRAPHY CHEST, ABDOMEN AND PELVIS TECHNIQUE: Non-contrast CT of the chest was initially obtained. Multidetector  CT imaging through the chest, abdomen and pelvis was performed using the standard protocol during bolus administration of intravenous contrast. Multiplanar reconstructed images and MIPs were obtained and reviewed to evaluate the vascular anatomy. CONTRAST:  116m OMNIPAQUE IOHEXOL 350 MG/ML SOLN COMPARISON:  12/15/2020 FINDINGS: CTA CHEST FINDINGS Cardiovascular: No evidence of thoracic aortic aneurysm or dissection. The heart is unremarkable without pericardial effusion. No evidence of pulmonary embolus. Mediastinum/Nodes: No pathologic adenopathy. Thyroid is heterogeneous but not enlarged. Trachea and esophagus are unremarkable. Lungs/Pleura: No airspace disease, effusion, or pneumothorax. Central airways are widely patent. Musculoskeletal: There is abnormal paraspinal soft tissue swelling extending from T2 through T4. At the T3/T4 disc space there is endplate irregularity with areas of bony resorption along the right anterolateral aspect of the T4 vertebral body, consistent with discitis/osteomyelitis. There are no acute displaced fractures. Reconstructed images demonstrate no additional findings. Review of the MIP images confirms the above findings. CTA ABDOMEN AND PELVIS FINDINGS VASCULAR Aorta: Normal caliber aorta without aneurysm, dissection, vasculitis or significant stenosis. Mild atherosclerosis. Celiac: Patent without evidence of aneurysm, dissection, vasculitis or significant stenosis. SMA: Patent without evidence of aneurysm, dissection, vasculitis or significant stenosis. Renals: Both renal arteries are patent without evidence of aneurysm, dissection, vasculitis, fibromuscular dysplasia or significant stenosis. IMA: Patent without evidence of aneurysm, dissection, vasculitis or significant stenosis. Inflow: Patent without evidence of aneurysm, dissection, vasculitis or significant stenosis. Veins: There is a tunneled right femoral PICC line, tip extending to the IVC at the level of the hepatic venous  confluence. Review of the MIP images confirms the above findings. NON-VASCULAR Hepatobiliary: Chronic pneumobilia unchanged, consistent with previous biliary duct manipulation. Gallbladder surgically absent. No focal liver abnormalities. Pancreas: Mild pancreatic duct dilation unchanged. The pancreatic parenchyma is unremarkable. Spleen: Normal in size without focal abnormality. Adrenals/Urinary Tract: There is bilateral renal cortical atrophy. No urinary tract calculi or obstructive uropathy. The adrenals and bladder are grossly unremarkable. Stomach/Bowel: Previous postsurgical changes from bowel resections and reanastomosis. No obstruction or ileus. There is diffuse colonic wall thickening, compatible with patient's known history of Crohn disease. Lymphatic: No pathologic adenopathy within the abdomen or pelvis. Reproductive: Status post hysterectomy. No adnexal masses. Other: No free fluid or free gas.  No abdominal wall hernia. Musculoskeletal: No acute or destructive bony lesions. Reconstructed images demonstrate no additional findings. Review of the MIP images confirms the above findings. IMPRESSION: 1. Paraspinal soft tissue swelling and bony irregularities within the upper thoracic spine from T2 through T4, compatible with discitis/osteomyelitis. 2. No evidence of thoracoabdominal aneurysm or dissection. Minimal atherosclerosis is noted. 3. Diffuse colonic wall thickening consistent with colitis, compatible with patient's known history of Crohn disease. Critical Value/emergent results were called by telephone at the time of interpretation on 02/09/2021 at 10:52 pm to provider DR FLOYD, who verbally acknowledged these results. Electronically Signed   By: MRanda NgoM.D.   On: 02/09/2021 22:56    Pending Labs Unresulted Labs (From admission, onward)          Start     Ordered   02/09/21 2354  Sedimentation rate  ONCE - STAT,   STAT        02/09/21 2356   02/09/21  2305  Blood culture (routine x 2)   BLOOD CULTURE X 2,   STAT      02/09/21 2356   02/09/21 2305  Resp Panel by RT-PCR (Flu A&B, Covid) Nasopharyngeal Swab  (Tier 2 - Symptomatic/asymptomatic with Precautions )  Once,   STAT       Question Answer Comment  Is this test for diagnosis or screening Screening   Symptomatic for COVID-19 as defined by CDC No   Hospitalized for COVID-19 No   Admitted to ICU for COVID-19 No   Previously tested for COVID-19 Yes   Resident in a congregate (group) care setting Unknown   Employed in healthcare setting Unknown   Pregnant No   Has patient completed COVID vaccination(s) (2 doses of Pfizer/Moderna 1 dose of [employer]) Unknown      02/09/21 2356          Vitals/Pain Today's Vitals   02/09/21 2330 02/10/21 0000 02/10/21 0030 02/10/21 0043  BP: (!) 168/82 (!) 181/78 (!) 161/85   Pulse: 66 61 (!) 51   Resp: 14 10 20    Temp:   98 F (36.7 C)   TempSrc:      SpO2: 100% 100% 100%   PainSc:    Asleep    Isolation Precautions Airborne and Contact precautions  Medications Medications  sodium chloride 0.9 % bolus 1,000 mL (0 mLs Intravenous Stopped 02/09/21 2154)  fentaNYL (SUBLIMAZE) injection 50 mcg (50 mcg Intravenous Given 02/09/21 2123)  HYDROmorphone (DILAUDID) injection 0.5 mg (0.5 mg Intravenous Given 02/09/21 2154)  iohexol (OMNIPAQUE) 350 MG/ML injection 100 mL (100 mLs Intravenous Contrast Given 02/09/21 2227)  HYDROmorphone (DILAUDID) injection 1 mg (1 mg Intravenous Given 02/10/21 0013)  ondansetron (ZOFRAN) injection 4 mg (4 mg Intravenous Given 02/10/21 0013)    Mobility walks with person assist

## 2021-02-10 NOTE — Consult Note (Signed)
[place] for Infectious [facility]  Total days of antibiotics 1               Reason for Consult: thoracic ventral epidural abscess/thoracic discitis    Referring Physician: nettey  Principal Problem:   Thoracic discitis Active Problems:   Chronic diarrhea   Crohn's disease of small & large intestine with SGS   Short gut syndrome   Vitamin B12 deficiency   On total parenteral nutrition (TPN)    HPI: Caitlin Peters is a 60 y.o. female with history of crohn's disease s/p resection now with short gut syndrome and TPN dependent. She has venous access limitations, with a femoral line. She has had previous bacteremias (GN and GP) in the past associated with her line. She reports being out of the hospital for at least a month, but now presents with 4-5 day history of back pain. Denies fever, chils, nightsweats. She denied having any trauma or difficulty walking. Her bowels are loose, but unchanged.  Initial work up in the ED included CT angiogram that showed T4-T8 discitis which then proceeded to have MRI which revealed evidence of T2-T5 discitis/oste with paravertebral and ventral epidural abscess/phlegmon without significant canal stenosis. IR was asked to see if they could do any sampling but felt they were unable to do so. NSGY also evaluated patient and felt no immediate surgical intervention warranted at this time since no neuro deficits. ID asked to weigh in on abtx recommendations.   Patient denies any issues with her femoral line or dressing changes  Past Medical History:  Diagnosis Date  . Acute pancreatitis 04/13/2020  . Anasarca 10/2019  . AVN (avascular necrosis of bone) ([place])   . Cataract   . Choledocholithiasis (sludge) s/p ERCP 10/2019 10/21/2020  . Chronic pain syndrome   . CKD (chronic kidney disease), stage III ([place])   . Crohn disease ([place])   . Crohn's disease of small & large intestine with SGS 1984   Caitlin Peters is a 60year old female with a history of  Crohn's disease diagnosed in 102 (age 21), history of long-term steroid use with osteoporosis, S/P multiple bowel resections ([id number]) complicated by chronic back and abdominal pain, steatorrhea and short bowel syndrome. The patient has been left with ~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 attached to proximal transverse colon through rectum. She has been  . Depression   . Diverticulosis   . GERD (gastroesophageal reflux disease)   . HTN (hypertension)   . IDA (iron deficiency anemia)   . Malnutrition ([place])   . Mass in chest   . Osteoporosis   . Osteoporosis 12/24/2014  . Pancreatitis   . 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; 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; jejunal to transverse colon anastomosis Treated at [facility] lengthening to 165cm Dr Alene Mires, [facility]  ESTABLISHED AT [facility]  . Vitamin B12 deficiency     Allergies:  Allergies  Allergen Reactions  . Meperidine Hives    Other reaction(s): GI Upset Due to [place]   . Hyoscyamine Hives and Swelling    Legs swelling   Disorientation  . Cefepime Other (See Comments)    Neurotoxicity occurring in setting of AKI. Ceftriaxone tolerated during same admit  . Gabapentin Other (See Comments)    unknown  . Lyrica [Pregabalin] Other (See Comments)    unknown  . Topamax [Topiramate] Other (See Comments)    unknown  .  Zosyn [Piperacillin Sod-Tazobactam So]     Patient reports it makes her vomit, her neck stiff, and her "heart feel funny"  . Fentanyl Rash    Pt is allergic to fentanyl patch related to the glue (gives her a rash) Pt states she is NOT allergic to fentanyl IV medicine  . Morphine And Related Rash    Current antibiotics:   MEDICATIONS: . amLODipine  10 mg Oral Daily  . budesonide  9 mg Oral QODAY  . calcium carbonate  1 tablet Oral Daily  . Chlorhexidine Gluconate Cloth  6 each Topical Daily  . cholecalciferol  1,000 Units Oral Daily  .  cycloSPORINE  1 drop Both Eyes BID  . DULoxetine  90 mg Oral Daily  . hydrALAZINE  75 mg Oral TID  . insulin aspart  0-9 Units Subcutaneous Q8H  . levETIRAcetam  500 mg Oral BID  . methadone  5 mg Oral 5 X Daily  . metoprolol tartrate  100 mg Oral BID  . pantoprazole  40 mg Oral Daily  . polyvinyl alcohol  1 drop Both Eyes QID  . sucralfate  1 g Oral TID WC & HS  . vitamin B-12  1,000 mcg Oral Daily    Social History   Tobacco Use  . Smoking status: Former Research scientist (life sciences)  . Smokeless tobacco: Never Used  Vaping Use  . Vaping Use: Never used  Substance Use Topics  . Alcohol use: Not Currently  . Drug use: Never    Family History  Problem Relation Age of Onset  . Breast cancer Sister   . Multiple sclerosis Sister   . Diabetes Sister   . Lupus Sister   . Colon cancer Other   . Crohn's disease Other   . Seizures Mother   . Glaucoma Mother   . CAD Father   . Heart disease Father   . Hypertension Father     Review of Systems -   Review of Systems  Constitutional: Negative for fever, chills, diaphoresis, activity change, appetite change, fatigue and unexpected weight change.  HENT: Negative for congestion, sore throat, rhinorrhea, sneezing, trouble swallowing and sinus pressure.  Eyes: Negative for photophobia and visual disturbance.  Respiratory: Negative for cough, chest tightness, shortness of breath, wheezing and stridor.  Cardiovascular: Negative for chest pain, palpitations and leg swelling.  Gastrointestinal: Negative for nausea, vomiting, abdominal pain, diarrhea, constipation, blood in stool, abdominal distention and anal bleeding.  Genitourinary: Negative for dysuria, hematuria, flank pain and difficulty urinating.  Musculoskeletal: + back pain. Negative for myalgias, , joint swelling, arthralgias and gait problem.  Skin: Negative for color change, pallor, rash and wound.  Neurological: Negative for dizziness, tremors, weakness and light-headedness.  Hematological:  Negative for adenopathy. Does not bruise/bleed easily.  Psychiatric/Behavioral: Negative for behavioral problems, confusion, sleep disturbance, dysphoric mood, decreased concentration and agitation.     OBJECTIVE: Temp:  [98 F (36.7 C)-99 F (37.2 C)] 98.3 F (36.8 C) (03/18 1335) Pulse Rate:  [28-84] 64 (03/18 1335) Resp:  [10-22] 15 (03/18 1335) BP: (106-189)/(60-85) 188/78 (03/18 1335) SpO2:  [99 %-100 %] 100 % (03/18 1335) Weight:  [67.5 kg] 67.5 kg (03/18 1210) Physical Exam  Constitutional:  oriented to person, place, and time. appears well-developed and well-nourished. No distress.  HENT: [place]/AT, PERRLA, no scleral icterus Mouth/Throat: Oropharynx is clear and moist. No oropharyngeal exudate.  Cardiovascular: Normal rate, regular rhythm and normal heart sounds. Exam reveals no gallop and no friction rub.  No murmur heard.  Pulmonary/Chest: Effort normal  and breath sounds normal. No respiratory distress.  has no wheezes.  Neck = supple, no nuchal rigidity Abdominal: Soft. Bowel sounds are normal.  exhibits no distension. mild tenderness to left lower quadrant Ext: right femoral line is c/d/i Lymphadenopathy: no cervical adenopathy. No axillary adenopathy Neurological: alert and oriented to person, place, and time. No focal deficits Skin: Skin is warm and dry. No rash noted. No erythema.  Psychiatric: a normal mood and affect.  behavior is normal.    LABS: Results for orders placed or performed during the hospital encounter of 02/09/21 (from the past 48 hour(s))  Basic metabolic panel     Status: Abnormal   Collection Time: 02/09/21  7:28 PM  Result Value Ref Range   Sodium 140 135 - 145 mmol/L   Potassium 3.6 3.5 - 5.1 mmol/L   Chloride 108 98 - 111 mmol/L   CO2 21 (L) 22 - 32 mmol/L   Glucose, Bld 84 70 - 99 mg/dL    Comment: Glucose reference range applies only to samples taken after fasting for at least 8 hours.   BUN 29 (H) 6 - 20 mg/dL   Creatinine, Ser 1.24 (H)  0.44 - 1.00 mg/dL   Calcium 9.6 8.9 - 10.3 mg/dL   GFR, Estimated 50 (L) >60 mL/min    Comment: (NOTE) Calculated using the CKD-EPI Creatinine Equation (2021)    Anion gap 11 5 - 15    Comment: Performed at [facility], [place] [address], [place], [place] [address]  CBC     Status: Abnormal   Collection Time: 02/09/21  7:28 PM  Result Value Ref Range   WBC 5.8 4.0 - 10.5 K/uL   RBC 3.07 (L) 3.87 - 5.11 MIL/uL   Hemoglobin 8.9 (L) 12.0 - 15.0 g/dL   HCT 27.7 (L) 36.0 - 46.0 %   MCV 90.2 80.0 - 100.0 fL   MCH 29.0 26.0 - 34.0 pg   MCHC 32.1 30.0 - 36.0 g/dL   RDW 13.2 11.5 - 15.5 %   Platelets 235 150 - 400 K/uL   nRBC 0.0 0.0 - 0.2 %    Comment: Performed at [facility], [place] [address], [place], [place] [address]  Troponin I (High Sensitivity)     Status: None   Collection Time: 02/09/21  7:28 PM  Result Value Ref Range   Troponin I (High Sensitivity) 4 <18 ng/L    Comment: (NOTE) Elevated high sensitivity troponin I (hsTnI) values and significant  changes across serial measurements may suggest ACS but many other  chronic and acute conditions are known to elevate hsTnI results.  Refer to the "Links" section for chest pain algorithms and additional  guidance. Performed at [facility], [place] [address] [place], [place] [address]   Hepatic function panel     Status: Abnormal   Collection Time: 02/09/21  7:28 PM  Result Value Ref Range   Total Protein 7.5 6.5 - 8.1 g/dL   Albumin 3.3 (L) 3.5 - 5.0 g/dL   AST 15 15 - 41 U/L   ALT 14 0 - 44 U/L   Alkaline Phosphatase 134 (H) 38 - 126 U/L   Total Bilirubin 1.0 0.3 - 1.2 mg/dL   Bilirubin, Direct 0.1 0.0 - 0.2 mg/dL   Indirect Bilirubin 0.9 0.3 - 0.9 mg/dL    Comment: Performed at [facility], [place] [address], [place],  [address]  Lipase, blood     Status: None  Collection Time: 02/09/21  7:28 PM  Result Value Ref Range   Lipase 25 11 - 51 U/L     Comment: Performed at [facility], [place] [address], [place], [place] [address]  Lactic acid, plasma     Status: None   Collection Time: 02/09/21  7:28 PM  Result Value Ref Range   Lactic Acid, Venous 1.0 0.5 - 1.9 mmol/L    Comment: Performed at [facility], [place] [address] [place], [place] [address]  Urinalysis, Routine w reflex microscopic Urine, Clean Catch     Status: Abnormal   Collection Time: 02/09/21  8:35 PM  Result Value Ref Range   Color, Urine YELLOW YELLOW   APPearance HAZY (A) CLEAR   Specific Gravity, Urine 1.033 (H) 1.005 - 1.030   pH 5.0 5.0 - 8.0   Glucose, UA NEGATIVE NEGATIVE mg/dL   Hgb urine dipstick NEGATIVE NEGATIVE   Bilirubin Urine NEGATIVE NEGATIVE   Ketones, ur 5 (A) NEGATIVE mg/dL   Protein, ur >=300 (A) NEGATIVE mg/dL   Nitrite NEGATIVE NEGATIVE   Leukocytes,Ua NEGATIVE NEGATIVE   RBC / HPF 0-5 0 - 5 RBC/hpf   WBC, UA 0-5 0 - 5 WBC/hpf   Bacteria, UA RARE (A) NONE SEEN   Squamous Epithelial / LPF 11-20 0 - 5   Mucus PRESENT     Comment: Performed at [facility], [place] [address], [place], [place] [address]  Troponin I (High Sensitivity)     Status: None   Collection Time: 02/09/21 10:00 PM  Result Value Ref Range   Troponin I (High Sensitivity) 6 <18 ng/L    Comment: (NOTE) Elevated high sensitivity troponin I (hsTnI) values and significant  changes across serial measurements may suggest ACS but many other  chronic and acute conditions are known to elevate hsTnI results.  Refer to the "Links" section for chest pain algorithms and additional  guidance. Performed at [facility], [place] [address], [place], [place] [address]   C-reactive protein     Status: Abnormal   Collection Time: 02/09/21 11:52 PM  Result Value Ref Range   CRP 3.9 (H) <1.0 mg/dL    Comment: Performed at [facility], [place] [address], [place], [place] [address]  Sedimentation rate      Status: Abnormal   Collection Time: 02/09/21 11:54 PM  Result Value Ref Range   Sed Rate 101 (H) 0 - 22 mm/hr    Comment: Performed at [facility], [place] [address], [place], [place] [address]  Resp Panel by RT-PCR (Flu A&B, Covid) Nasopharyngeal Swab     Status: None   Collection Time: 02/10/21 12:16 AM   Specimen: Nasopharyngeal Swab; Nasopharyngeal(NP) swabs in vial transport medium  Result Value Ref Range   SARS Coronavirus 2 by RT PCR NEGATIVE NEGATIVE    Comment: (NOTE) SARS-CoV-2 target nucleic acids are NOT DETECTED.  The SARS-CoV-2 RNA is generally detectable in upper respiratory specimens during the acute phase of infection. The lowest concentration of SARS-CoV-2 viral copies this assay can detect is 138 copies/mL. A negative result does not preclude SARS-Cov-2 infection and should not be used as the sole basis for treatment or other patient management decisions. A negative result may occur with  improper specimen collection/handling, submission of specimen other than nasopharyngeal swab, presence of viral mutation(s) within the areas targeted by this assay, and inadequate number of viral copies(<138 copies/mL). A negative result must be combined with clinical observations, patient  history, and epidemiological information. The expected result is Negative.  Fact Sheet for Patients:  EntrepreneurPulse.com.au  Fact Sheet for Healthcare Providers:  IncredibleEmployment.be  This test is no t yet approved or cleared by the [place] FDA and  has been authorized for detection and/or diagnosis of SARS-CoV-2 by FDA under an Emergency Use Authorization (EUA). This EUA will remain  in effect (meaning this test can be used) for the duration of the COVID-19 declaration under Section 564(b)(1) of the Act, 21 U.S.C.section 360bbb-3(b)(1), unless the authorization is terminated  or revoked sooner.       Influenza A by PCR  NEGATIVE NEGATIVE   Influenza B by PCR NEGATIVE NEGATIVE    Comment: (NOTE) The Xpert Xpress SARS-CoV-2/FLU/RSV plus assay is intended as an aid in the diagnosis of influenza from Nasopharyngeal swab specimens and should not be used as a sole basis for treatment. Nasal washings and aspirates are unacceptable for Xpert Xpress SARS-CoV-2/FLU/RSV testing.  Fact Sheet for Patients: EntrepreneurPulse.com.au  Fact Sheet for Healthcare Providers: IncredibleEmployment.be  This test is not yet approved or cleared by the [place] FDA and has been authorized for detection and/or diagnosis of SARS-CoV-2 by FDA under an Emergency Use Authorization (EUA). This EUA will remain in effect (meaning this test can be used) for the duration of the COVID-19 declaration under Section 564(b)(1) of the Act, 21 U.S.C. section 360bbb-3(b)(1), unless the authorization is terminated or revoked.  Performed at [facility], [place] [address], [place], [place] [address]   Comprehensive metabolic panel     Status: Abnormal   Collection Time: 02/10/21  3:17 AM  Result Value Ref Range   Sodium 137 135 - 145 mmol/L   Potassium 3.5 3.5 - 5.1 mmol/L   Chloride 106 98 - 111 mmol/L   CO2 22 22 - 32 mmol/L   Glucose, Bld 79 70 - 99 mg/dL    Comment: Glucose reference range applies only to samples taken after fasting for at least 8 hours.   BUN 26 (H) 6 - 20 mg/dL   Creatinine, Ser 1.23 (H) 0.44 - 1.00 mg/dL   Calcium 9.2 8.9 - 10.3 mg/dL   Total Protein 6.9 6.5 - 8.1 g/dL   Albumin 2.9 (L) 3.5 - 5.0 g/dL   AST 12 (L) 15 - 41 U/L   ALT 13 0 - 44 U/L   Alkaline Phosphatase 124 38 - 126 U/L   Total Bilirubin 0.8 0.3 - 1.2 mg/dL   GFR, Estimated 51 (L) >60 mL/min    Comment: (NOTE) Calculated using the CKD-EPI Creatinine Equation (2021)    Anion gap 9 5 - 15    Comment: Performed at [facility], [place] [address], [place], [place] [address]   CBC WITH DIFFERENTIAL     Status: Abnormal   Collection Time: 02/10/21  3:17 AM  Result Value Ref Range   WBC 4.9 4.0 - 10.5 K/uL   RBC 2.86 (L) 3.87 - 5.11 MIL/uL   Hemoglobin 8.4 (L) 12.0 - 15.0 g/dL   HCT 25.9 (L) 36.0 - 46.0 %   MCV 90.6 80.0 - 100.0 fL   MCH 29.4 26.0 - 34.0 pg   MCHC 32.4 30.0 - 36.0 g/dL   RDW 13.1 11.5 - 15.5 %   Platelets 214 150 - 400 K/uL   nRBC 0.0 0.0 - 0.2 %   Neutrophils Relative % 59 %   Neutro Abs 2.9 1.7 - 7.7 K/uL   Lymphocytes Relative 34 %  Lymphs Abs 1.7 0.7 - 4.0 K/uL   Monocytes Relative 6 %   Monocytes Absolute 0.3 0.1 - 1.0 K/uL   Eosinophils Relative 1 %   Eosinophils Absolute 0.0 0.0 - 0.5 K/uL   Basophils Relative 0 %   Basophils Absolute 0.0 0.0 - 0.1 K/uL   Immature Granulocytes 0 %   Abs Immature Granulocytes 0.01 0.00 - 0.07 K/uL    Comment: Performed at [facility], [place] [address] [place], [place] [address]  Prealbumin     Status: None   Collection Time: 02/10/21  3:17 AM  Result Value Ref Range   Prealbumin 20.0 18 - 38 mg/dL    Comment: Performed at [facility], [place] [address], [place], [place] [address]  Magnesium     Status: None   Collection Time: 02/10/21  3:17 AM  Result Value Ref Range   Magnesium 1.9 1.7 - 2.4 mg/dL    Comment: Performed at [facility], [place] [address] [place], [place] [address]  Triglycerides     Status: None   Collection Time: 02/10/21  3:17 AM  Result Value Ref Range   Triglycerides 137 <150 mg/dL    Comment: Performed at [facility], [place] [address], [place], [place] [address]  Phosphorus     Status: None   Collection Time: 02/10/21  3:17 AM  Result Value Ref Range   Phosphorus 3.0 2.5 - 4.6 mg/dL    Comment: Performed at [facility], [place] [address], [place],  [address]    MICRO: reviewed IMAGING: MR THORACIC SPINE W WO CONTRAST  Result Date: 02/10/2021 CLINICAL DATA:  Mid  back pain, abnormal CT EXAM: MRI THORACIC WITHOUT AND WITH CONTRAST TECHNIQUE: Multiplanar and multiecho pulse sequences of the thoracic spine were obtained without and with intravenous contrast. CONTRAST:  81m GADAVIST GADOBUTROL 1 MMOL/ML IV SOLN COMPARISON:  None. FINDINGS: Alignment:  Preserved. Vertebrae: There is abnormal marrow signal and enhancement at T3 and T4 with endplate irregularity prior CT. There is also abnormal signal at the posterior aspect of the T2 vertebral body and along the superior T5 endplate. Cord:  No abnormal signal. Paraspinal and other soft tissues: Paraspinal edema and enhancement centered at the above levels. No evidence of abscess. Disc levels: Mild T2 hyperintensity and enhancement within the T3-T4 and T4-T5 disc spaces. There is ventral epidural enhancement from T2-T3 to T4-T5 without significant canal stenosis. IMPRESSION: Evidence of discitis/osteomyelitis from T2-T5. Paravertebral and ventral epidural phlegmon. No significant canal stenosis. No abscess. Electronically Signed   By: PMacy MisM.D.   On: 02/10/2021 11:46   DG Chest Port 1 View  Result Date: 02/09/2021 CLINICAL DATA:  Chest pain. EXAM: PORTABLE CHEST 1 VIEW COMPARISON:  December 18, 2020. FINDINGS: The heart size and mediastinal contours are within normal limits. Both lungs are clear. No pneumothorax or pleural effusion is noted. The visualized skeletal structures are unremarkable. IMPRESSION: No active disease. Electronically Signed   By: JMarijo ConceptionM.D.   On: 02/09/2021 19:15   CT Angio Chest/Abd/Pel for Dissection W and/or Wo Contrast  Result Date: 02/09/2021 CLINICAL DATA:  Bilateral shoulder and abdominal pain EXAM: CT ANGIOGRAPHY CHEST, ABDOMEN AND PELVIS TECHNIQUE: Non-contrast CT of the chest was initially obtained. Multidetector CT imaging through the chest, abdomen and pelvis was performed using the standard protocol during bolus administration of intravenous contrast. Multiplanar  reconstructed images and MIPs were obtained and reviewed to evaluate the vascular anatomy. CONTRAST:  148m OMNIPAQUE IOHEXOL 350 MG/ML SOLN COMPARISON:  12/15/2020 FINDINGS: CTA CHEST FINDINGS Cardiovascular: No evidence of thoracic aortic aneurysm or dissection. The heart is unremarkable without pericardial effusion. No evidence of pulmonary embolus. Mediastinum/Nodes: No pathologic adenopathy. Thyroid is heterogeneous but not enlarged. Trachea and esophagus are unremarkable. Lungs/Pleura: No airspace disease, effusion, or pneumothorax. Central airways are widely patent. Musculoskeletal: There is abnormal paraspinal soft tissue swelling extending from T2 through T4. At the T3/T4 disc space there is endplate irregularity with areas of bony resorption along the right anterolateral aspect of the T4 vertebral body, consistent with discitis/osteomyelitis. There are no acute displaced fractures. Reconstructed images demonstrate no additional findings. Review of the MIP images confirms the above findings. CTA ABDOMEN AND PELVIS FINDINGS VASCULAR Aorta: Normal caliber aorta without aneurysm, dissection, vasculitis or significant stenosis. Mild atherosclerosis. Celiac: Patent without evidence of aneurysm, dissection, vasculitis or significant stenosis. SMA: Patent without evidence of aneurysm, dissection, vasculitis or significant stenosis. Renals: Both renal arteries are patent without evidence of aneurysm, dissection, vasculitis, fibromuscular dysplasia or significant stenosis. IMA: Patent without evidence of aneurysm, dissection, vasculitis or significant stenosis. Inflow: Patent without evidence of aneurysm, dissection, vasculitis or significant stenosis. Veins: There is a tunneled right femoral PICC line, tip extending to the IVC at the level of the hepatic venous confluence. Review of the MIP images confirms the above findings. NON-VASCULAR Hepatobiliary: Chronic pneumobilia unchanged, consistent with previous  biliary duct manipulation. Gallbladder surgically absent. No focal liver abnormalities. Pancreas: Mild pancreatic duct dilation unchanged. The pancreatic parenchyma is unremarkable. Spleen: Normal in size without focal abnormality. Adrenals/Urinary Tract: There is bilateral renal cortical atrophy. No urinary tract calculi or obstructive uropathy. The adrenals and bladder are grossly unremarkable. Stomach/Bowel: Previous postsurgical changes from bowel resections and reanastomosis. No obstruction or ileus. There is diffuse colonic wall thickening, compatible with patient's known history of Crohn disease. Lymphatic: No pathologic adenopathy within the abdomen or pelvis. Reproductive: Status post hysterectomy. No adnexal masses. Other: No free fluid or free gas.  No abdominal wall hernia. Musculoskeletal: No acute or destructive bony lesions. Reconstructed images demonstrate no additional findings. Review of the MIP images confirms the above findings. IMPRESSION: 1. Paraspinal soft tissue swelling and bony irregularities within the upper thoracic spine from T2 through T4, compatible with discitis/osteomyelitis. 2. No evidence of thoracoabdominal aneurysm or dissection. Minimal atherosclerosis is noted. 3. Diffuse colonic wall thickening consistent with colitis, compatible with patient's known history of Crohn disease. Critical Value/emergent results were called by telephone at the time of interpretation on 02/09/2021 at 10:52 pm to provider DR FLOYD, who verbally acknowledged these results. Electronically Signed   By: MRanda NgoM.D.   On: 02/09/2021 22:56   Assessment/Plan:  89yoF with crohn's disease, short gut syndrome, on chronic TPN, hx of recurrent bacteremias. Admitted for back pain found to have thoracic discitis/osteomyelitis with ventral epidural abscess, no neuro deficits  - will start empiric vancomycin and cefepime. Plan to treat for 6 wks - will need to follow up current blood cx to see if that  can help guide therapy and decide if femoral line is involved  Abdominal pain = appears to have colitis on imaging. Defer to primary if further management other than pain control is needed  Will see back on Monday. ID team will review results over the weekend.

## 2021-02-10 NOTE — TOC Initial Note (Signed)
Transition of Care [facility]) - Initial/Assessment Note   Patient Details  Name: Caitlin Peters MRN: [id number] Date of Birth: 11-Oct-1961  Transition of Care [facility]) CM/SW Contact:    Sherie Don, LCSW Phone Number: 02/10/2021, 1:21 PM  Clinical Narrative: Patient is a 60 year old female who was admitted for thoracic discitis. Readmission checklist completed due to high readmission score.  CSW met with patient to complete assessment. Per patient, she resides at home with her SO, daughter, and two grandchildren. Patient does not have any DME at home or current need for equipment at this time. Patient is independent with ADLs at baseline and is able to afford her monthly medications. [facility] is used for transportation to medical appointments. Patient is active with [facility] for chronic TPN and [facility] for [facility] once per week. TOC to follow for possible discharge needs.  Expected Discharge Plan: [place] Barriers to Discharge: Continued Medical Work up  Patient Goals and CMS Choice Patient states their goals for this hospitalization and ongoing recovery are:: Discharge home with family CMS Medicare.gov Compare Post Acute Care list provided to:: Patient Choice offered to / list presented to : Patient  Expected Discharge Plan and Services Expected Discharge Plan: [place] In-house Referral: Clinical Social Work Living arrangements for the past 2 months: Apartment              DME Arranged: N/A DME Agency: NA  Prior Living Arrangements/Services Living arrangements for the past 2 months: Apartment Lives with:: Significant Other,Adult Children,Other (Comment) Ambulance person) Patient language and need for interpreter reviewed:: Yes Do you feel safe going back to the place where you live?: Yes      Need for Family Participation in Patient Care: No (Comment) Care giver support system in place?: Yes (comment) Current home services: DME,Home RN (RN w/[facility]; TPN  w/[facility]) Criminal Activity/Legal Involvement Pertinent to Current Situation/Hospitalization: No - Comment as needed  Activities of Daily Living Home Assistive Devices/Equipment: Environmental consultant (specify type) ADL Screening (condition at time of admission) Patient's cognitive ability adequate to safely complete daily activities?: Yes Is the patient deaf or have difficulty hearing?: No Does the patient have difficulty seeing, even when wearing glasses/contacts?: No Does the patient have difficulty concentrating, remembering, or making decisions?: No Patient able to express need for assistance with ADLs?: Yes Does the patient have difficulty dressing or bathing?: No Independently performs ADLs?: Yes (appropriate for developmental age) Does the patient have difficulty walking or climbing stairs?: No Weakness of Legs: Both Weakness of Arms/Hands: Both  Emotional Assessment Appearance:: Appears stated age Attitude/Demeanor/Rapport: Engaged Affect (typically observed): Accepting Orientation: : Oriented to Self,Oriented to Place,Oriented to  Time,Oriented to Situation Alcohol / Substance Use: Not Applicable  Admission diagnosis:  Discitis [M46.40] Patient Active Problem List   Diagnosis Date Noted  . Thoracic discitis 02/10/2021  . Pressure injury of skin 12/18/2020  . Bacteremia 12/17/2020  . Seizures ([place]) 12/17/2020  . Drug-seeking behavior 12/16/2020  . Seizure ([place]) 12/15/2020  . CKD (chronic kidney disease) stage 3, GFR 30-59 ml/min ([name]) 12/06/2020  . COVID-19 virus infection 12/06/2020  . Protein calorie malnutrition ([place]) 12/06/2020  . Palpitations 11/22/2020  . PAC (premature atrial contraction) 11/22/2020  . History of central line-associated bloodstream infection (CLABSI) 10/21/2020  . Nausea & vomiting 10/21/2020  . Choledocholithiasis (sludge) s/p ERCP 10/2019 10/21/2020  . 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 Abdominal pain 09/07/2020  . Acute metabolic encephalopathy    . Hypomagnesemia   . Partial  small bowel obstruction ([place])   . Bacteremia associated with intravascular line ([place]) 08/04/2020  . Enterobacter sepsis ([place]) 07/22/2020  . Anxiety 06/03/2020  . Acute pancreatitis 04/13/2020  . Infection due to Acinetobacter species 03/10/2020  . Pancytopenia ([place]) 03/05/2020  . Central line infection   . Elevated liver enzymes 01/02/2020  . Cholangitis   . Anasarca 10/28/2019  . Acute kidney injury superimposed on chronic kidney disease ([place]) 10/28/2019  . Falls 10/28/2019  . Malnutrition ([place])   . Vitamin B12 deficiency   . GERD (gastroesophageal reflux disease)   . Chronic pain syndrome   . Hypokalemia due to excessive gastrointestinal loss of potassium 10/13/2019  . Fever   . Hypokalemia 10/12/2019  . Chronic diarrhea 10/12/2019  . Dysuria 10/12/2019  . Bilateral lower extremity edema 10/12/2019  . AKI (acute kidney injury) ([place]) 10/12/2019  . Fungemia 08/27/2019  . IDA (iron deficiency anemia) 11/03/2018  . Dilation of biliary tract 08/28/2018  . Severe diarrhea 03/07/2018  . LFTs abnormal 01/27/2018  . GI tract obstruction ([place]) 01/27/2018  . Gram-negative bacteremia 09/13/2017  . HTN (hypertension), benign 12/02/2016  . Intractable pain 12/02/2016  . Anemia 12/02/2016  . 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 Avascular necrosis ([place]) 06/14/2015  . Polyarthralgia 05/03/2015  . On total parenteral nutrition (TPN) 12/30/2014  . Osteoporosis 12/24/2014  . Low back pain 12/16/2014  . Vitamin D deficiency 12/16/2014  . 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 History of colonic diverticulitis 2014  . Depression 07/24/2012  . Gram-positive bacteremia 07/24/2012  . Small bowel obstruction due to adhesions ([place]) 07/24/2012  . Diarrhea 12/13/2011  . Neuralgia and neuritis 06/01/2011  . Myalgia and myositis 08/12/2003  . Crohn's disease of small & large intestine with [place]   PCP:  [name], [name], MD Pharmacy:   [facility], [address] [place] [address] Phone: [phone] Fax: [phone]  Readmission Risk Interventions Readmission Risk Prevention Plan 02/10/2021 12/21/2020 11/09/2020  Transportation Screening Complete Complete Complete  PCP or Specialist Appt within 3-5 Days - - -  Not Complete comments - - -  HRI or [place] for [place] consult not completed comments - - -  Palliative Care Screening - - -  Medication Review Press photographer) Complete - Complete  PCP or Specialist appointment within 3-5 days of discharge - Complete Complete  HRI or Home Care Consult Complete Complete Complete  SW Recovery Care/Counseling Consult Complete Complete -  Palliative Care Screening Not Applicable - Not [place] Not Applicable - Not Applicable

## 2021-02-10 NOTE — Progress Notes (Signed)
PHARMACY - TOTAL PARENTERAL NUTRITION CONSULT NOTE   Indication: Short bowel syndrome  Patient Measurements: ordered on 3/18     Body mass index is 23.31 kg/m. Usual Weight:   Assessment: 66 yoF, hx of Short gut syndrome and Crohn's disease on chronic cyclic TPN since 8333.  She can tolerate PO, but only very small quantities.  Teduglutide started January 2022 for short bowel syndrome.  Last dose unknown, reported as within the past week.  She is admitted on 3/18 for worsening back pain with CT concerning for discitis.   Pharmacy is consulted to continue TPN.  Glucose / Insulin: Glucose low, previously did not require insulin Electrolytes: K 3.5, Corr Ca 10, CO2 22 Renal: SCr 1.23 (baseline appears to be ~1.1) Hepatic: WNL, TG 137 Prealbumin: 20 (3/18) previously 18.3 (Jan 2022) Intake / Output; MIVF: no IVF, I/O incomplete GI Imaging: 3/17 Diffuse colonic wall thickening consistent with colitis, compatible with patient's known history of Crohn disease. GI Surgeries / Procedures:  Medications:  Budesonide QOD, Tums QD, PPI QD, Sucralfate QID, D3, B12.  Creon PRN (no scheduled doses), loperamide prn, lomotic prn   Central access: femoral CVC TPN start date: continued from home  Nutritional Goals (RD recommendation on 3/18):   Kcal:  1875-2025 kcal, Protein:  95-105 grams, Fluid:  >/= 2 L/day Goal Cyclic TPN is 8329 mL over 12 hr to provides 99 g of protein and 1947 kcals per day.   Outpatient [facility] TPN was providing:  1800 mL TPN over 12 hours daily.  Lipids on MWF only.  85 g protein daily and 7 day average 1292 kcal daily  Current Nutrition:  Regular diet, TPN  Plan:  Cyclic TPN to begin at 1916 - Infuse 1800 mL over 12 hrs with 1 hour ramp up and 1 hour ramp down. - Max infusion rate 164 ml/hr Electrolytes in TPN: Na 43mq/L, K 512m/L, Ca 63m463mL, Mg 63mE63m, and Phos 163mm71m. Cl:Ac ratio 1:2 Add standard MVI and trace elements to TPN Initiate Q8h CBGs and sensitive  SSI and adjust as needed Monitor TPN labs on Mon/Thurs.  CMET, mag, phos tomorrow AM.   ChrisGretta ArabmD, BCPS Clinical Pharmacist [facility] main pharmacy [phone] 7:55 AM

## 2021-02-10 NOTE — H&P (Signed)
History and Physical    Caitlin Peters MIW:[id number] DOB: 1961-03-13 DOA: 02/09/2021  PCP: [name], [name], MD  Patient coming from: Home.  Chief Complaint: Back pain.  HPI: Caitlin Peters is a 60 y.o. 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  Denies any focal deficits.    ED Course: In the ER given the patient's symptoms patient underwent CT angiogram of the chest abdomen pelvis which shows features concerning for discitis involving the T8-T4 area.  ER patient discussed with on-call neurosurgeon who advised getting blood cultures and getting MRI.  Patient likely will need tissue biopsy before starting antibiotics.  Patient presently not septic appearing.  Labs show chronic anemia.  Covid testing negative.  Review of Systems: As per HPI, rest all negative.   Past Medical History:  Diagnosis Date  . Acute pancreatitis 04/13/2020  . Anasarca 10/2019  . AVN (avascular necrosis of bone) ([place])   . Cataract   . Choledocholithiasis (sludge) s/p ERCP 10/2019 10/21/2020  . Chronic pain syndrome   . CKD (chronic kidney disease), stage III ([place])   . Crohn disease ([place])   . Crohn's disease of small & large intestine with SGS 1984   Caitlin Peters is a 60year old female with a history of Crohn's disease diagnosed in 25 (age 70), history of long-term steroid use with osteoporosis, S/P multiple bowel resections ([id number]) complicated by chronic back and abdominal pain, steatorrhea and short bowel syndrome. The patient has been left with ~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 attached to proximal transverse colon through rectum. She has been  . Depression   . Diverticulosis   . GERD (gastroesophageal reflux disease)   . HTN (hypertension)   . IDA (iron deficiency anemia)   . Malnutrition ([place])   . Mass in  chest   . Osteoporosis   . Osteoporosis 12/24/2014  . Pancreatitis   . 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; 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; jejunal to transverse colon anastomosis Treated at [place] SB lengthening to 165cm Dr Alene Mires, [facility]  ESTABLISHED AT [facility]  . Vitamin B12 deficiency     Past Surgical History:  Procedure Laterality Date  . ABDOMINAL ADHESION SURGERY  01/22/2018  . APPENDECTOMY  1989  . BILIARY DILATION  11/26/2019   Procedure: BILIARY DILATION;  Surgeon: Jackquline Denmark, MD;  Location: [facility];  Service: Endoscopy;;  . BILIARY DILATION  03/08/2020   Procedure: BILIARY DILATION;  Surgeon: Irving Copas., MD;  Location: [place];  Service: Gastroenterology;;  . BIOPSY  03/08/2020   Procedure: BIOPSY;  Surgeon: Irving Copas., MD;  Location: [place];  Service: Gastroenterology;;  . CHEST WALL RESECTION     right thoracotomy,resection of chest mass with anterior rib and reconstruction using prosthetic mesh and video arthroscopy  . CHOLECYSTECTOMY  01/22/2018  . COLONOSCOPY  2019  . ENTEROSTOMY CLOSURE  04/1999  . ERCP N/A 11/26/2019   Procedure: ENDOSCOPIC RETROGRADE CHOLANGIOPANCREATOGRAPHY (ERCP);  Surgeon: Jackquline Denmark, MD;  Location: [facility];  Service: Endoscopy;  Laterality: N/A;  . ERCP N/A 03/08/2020   Procedure: ENDOSCOPIC RETROGRADE CHOLANGIOPANCREATOGRAPHY (ERCP);  Surgeon: Irving Copas., MD;  Location: [place];  Service: Gastroenterology;  Laterality: N/A;  . ESOPHAGOGASTRODUODENOSCOPY  N/A 03/08/2020   Procedure: ESOPHAGOGASTRODUODENOSCOPY (EGD);  Surgeon: Irving Copas., MD;  Location: [place];  Service: Gastroenterology;  Laterality: N/A;  . EUS N/A 03/08/2020   Procedure: UPPER ENDOSCOPIC ULTRASOUND (EUS) LINEAR;  Surgeon: Irving Copas., MD;  Location: [place];  Service:  Gastroenterology;  Laterality: N/A;  . ILEOCECETOMY  03/1999   ileocolon resection with abdominal stoma  . ILEOSTOMY CLOSURE  2001  . IR FLUORO GUIDE CV LINE LEFT  01/07/2020  . IR FLUORO GUIDE CV LINE LEFT  03/09/2020  . IR FLUORO GUIDE CV LINE LEFT  05/09/2020  . IR FLUORO GUIDE CV LINE LEFT  07/20/2020  . IR FLUORO GUIDE CV LINE RIGHT  08/05/2020  . IR PTA VENOUS EXCEPT DIALYSIS CIRCUIT  01/07/2020  . IR REMOVAL TUN CV CATH W/O FL  08/05/2020  . IR US GUIDE VASC ACCESS LEFT     x 2 06/17/19 and 09/14/2019  . IR US GUIDE VASC ACCESS RIGHT  08/05/2020  . KNEE SURGERY     right knee   . LAPAROSCOPIC SMALL BOWEL RESECTION  2009   2000-2009.  SB resections for Crohns Disease - now with Short gut  . OMENTECTOMY  01/22/2018  . PARTIAL HYSTERECTOMY  1984   with LSO  . REMOVAL OF STONES  11/26/2019   Procedure: REMOVAL OF STONES;  Surgeon: Jackquline Denmark, MD;  Location: [facility];  Service: Endoscopy;;  . REMOVAL OF STONES  03/08/2020   Procedure: REMOVAL OF STONES;  Surgeon: Irving Copas., MD;  Location: [place];  Service: Gastroenterology;;  . SALPINGOOPHORECTOMY Left 1984  . SALPINGOOPHORECTOMY Right 1990  . SERIAL TRANSVERSE ENTEROPLASTY (STEP) - SMALL BOWEL LENGTHENING  01/22/2018   Dr Alene Mires, [place] length from 120 to 165cm   . SMALL INTESTINE SURGERY  2002  . SMALL INTESTINE SURGERY  2003  . SPHINCTEROTOMY  11/26/2019   Procedure: SPHINCTEROTOMY;  Surgeon: Jackquline Denmark, MD;  Location: [facility];  Service: Endoscopy;;  . TOTAL ABDOMINAL HYSTERECTOMY  1990   with RSO  . UPPER GASTROINTESTINAL ENDOSCOPY       reports that she has quit smoking. She has never used smokeless tobacco. She reports previous alcohol use. She reports that she does not use drugs.  Allergies  Allergen Reactions  . Meperidine Hives    Other reaction(s): GI Upset Due to [place]   . Hyoscyamine Hives and Swelling    Legs swelling   Disorientation  . Cefepime Other (See  Comments)    Neurotoxicity occurring in setting of AKI. Ceftriaxone tolerated during same admit  . Gabapentin Other (See Comments)    unknown  . Lyrica [Pregabalin] Other (See Comments)    unknown  . Topamax [Topiramate] Other (See Comments)    unknown  . Zosyn [Piperacillin Sod-Tazobactam So]     Patient reports it makes her vomit, her neck stiff, and her "heart feel funny"  . Fentanyl Rash    Pt is allergic to fentanyl patch related to the glue (gives her a rash) Pt states she is NOT allergic to fentanyl IV medicine  . Morphine And Related Rash    Family History  Problem Relation Age of Onset  . Breast cancer Sister   . Multiple sclerosis Sister   . Diabetes Sister   . Lupus Sister   . Colon cancer Other   . Crohn's disease Other   . Seizures Mother   . Glaucoma Mother   . CAD Father   . Heart disease Father   .  Hypertension Father     Prior to Admission medications   Medication Sig Start Date End Date Taking? Authorizing Provider  acetaminophen (TYLENOL) 325 MG tablet Take 650 mg by mouth every 6 (six) hours as needed for mild pain.    Yes [provider]  amLODipine (NORVASC) 10 MG tablet Take 1 tablet (10 mg total) by mouth daily. 12/07/20 01/06/21 Yes Erline Hau, MD  budesonide (ENTOCORT EC) 3 MG 24 hr capsule TAKE 3 CAPSULES BY MOUTH ONCE DAILY Patient taking differently: Take 9 mg by mouth every other day. 04/05/20  Yes [name], [name], MD  Calcium 200 MG TABS Take 200 mg by mouth daily.   Yes [provider]  cholecalciferol (VITAMIN D3) 25 MCG (1000 UT) tablet Take 1,000 Units by mouth daily.    Yes [provider]  cycloSPORINE (RESTASIS) 0.05 % ophthalmic emulsion Place 1 drop into both eyes 2 (two) times daily.   Yes [provider]  dexlansoprazole (DEXILANT) 60 MG capsule Take 1 capsule (60 mg total) by mouth daily. 12/28/20  Yes [name], [name], MD  Dextran 70-Hypromellose 0.1-0.3 % SOLN  Place 1 drop into both eyes 4 (four) times daily.   Yes [provider]  diphenoxylate-atropine (LOMOTIL) 2.5-0.025 MG tablet TAKE 1 TABLET BY MOUTH 4 TIMES DAILY AS NEEDED FOR DIARRHEA OR  LOOSE  STOOLS Patient taking differently: Take 1 tablet by mouth 4 (four) times daily as needed for diarrhea or loose stools. 12/28/20  Yes [name], [name], MD  DULoxetine (CYMBALTA) 30 MG capsule Take 3 capsules (90 mg total) by mouth daily. 12/07/20  Yes [name], [name], MD  estradiol (ESTRACE) 2 MG tablet Take 1 tablet (2 mg total) by mouth daily. 12/07/20  Yes [name], [name], MD  GATTEX 5 MG KIT Inject 5 mLs into the skin daily. 01/03/21  Yes [provider]  hydrALAZINE (APRESOLINE) 50 MG tablet Take 1.5 tablets (75 mg total) by mouth 3 (three) times daily. 11/22/20 02/20/21 Yes Chandrasekhar, Mahesh A, MD  HYDROmorphone (DILAUDID) 4 MG tablet Take 4 mg by mouth 4 (four) times daily as needed for moderate pain.  09/15/20  Yes [provider]  levETIRAcetam (KEPPRA) 500 MG tablet Take 1 tablet (500 mg total) by mouth 2 (two) times daily. 01/20/21 02/19/21 Yes Erline Hau, MD  lipase/protease/amylase (CREON) 36000 UNITS CPEP capsule Take 1 capsule (36,000 Units total) by mouth 3 (three) times daily as needed (with meals for digestion). 06/28/20  Yes [name], [name], MD  loperamide (IMODIUM) 2 MG capsule TAKE 1 CAPSULE BY MOUTH AS NEEDED FOR DIARRHEA OR LOOSE STOOLS Patient taking differently: Take 2 mg by mouth as needed for diarrhea or loose stools. 01/20/21  Yes [name], [name], MD  methadone (DOLOPHINE) 5 MG tablet Take 1 tablet (5 mg total) by mouth 5 (five) times daily. 06/30/20  Yes Florencia Reasons, MD  metoprolol tartrate (LOPRESSOR) 100 MG tablet Take 1 tablet (100 mg total) by mouth 2 (two) times daily. 12/07/20  Yes [name], [name], MD  Multiple Vitamins-Minerals (MULTIVITAMIN ADULT PO) Take 1 tablet by mouth daily.    Yes [provider]  NARCAN 4 MG/0.1ML LIQD nasal spray kit Place 1 spray into the nose once.  10/14/20  Yes [provider]  PRESCRIPTION MEDICATION Inject 1 each into the vein daily. Home TPN . [facility] [place] . 1 bag for 12 hours. [phone]  Yes [provider]  Probiotic Product (PROBIOTIC-10 PO) Take 1 capsule by mouth daily.    Yes [provider]  promethazine (PHENERGAN) 25 MG tablet Take 1 tablet (25 mg total) by mouth every 6 (six) hours as needed for nausea or vomiting. 01/04/21  Yes [name], [name], MD  sodium chloride 0.9 % infusion Inject into the vein. 10/16/20  Yes [provider]  sucralfate (CARAFATE) 1 GM/10ML suspension Take 10 mLs (1 g total) by mouth 4 (four) times daily -  with meals and at bedtime. 06/28/20  Yes [name], [name], MD  Trace Minerals Cu-Mn-Se-Zn (TRALEMENT IV) Inject 1 mL into the vein. Used in hydration bag 4 times weekly   Yes [provider]  vitamin B-12 (CYANOCOBALAMIN) 100 MCG tablet Take 1,000 mcg by mouth daily.    Yes [provider]    Physical Exam: Constitutional: Moderately built and nourished. Vitals:   02/09/21 2330 02/10/21 0000 02/10/21 0030 02/10/21 0208  BP: (!) 168/82 (!) 181/78 (!) 161/85 (!) 186/70  Pulse: 66 61 (!) 51 68  Resp: _0 Temp:   98 F (36.7 C) 99 F (37.2 C)  TempSrc:    Oral  SpO2: 100% 100% 100% 100%   Eyes: Anicteric no pallor. ENMT: No discharge from the ears eyes nose or mouth. Neck: No mass felt.  No neck rigidity. Respiratory: No rhonchi or crepitations. Cardiovascular: S1-S2 heard. Abdomen: Soft nontender bowel sounds present. Musculoskeletal: No edema. Skin: No rash. Neurologic: Alert awake oriented to time place and person.  Moves all extremities. Psychiatric: Appears normal.  Normal affect.   Labs on Admission: I have personally reviewed following labs and imaging  studies  CBC: Recent Labs  Lab 02/09/21 1928  WBC 5.8  HGB 8.9*  HCT 27.7*  MCV 90.2  PLT 982   Basic Metabolic Panel: Recent Labs  Lab 02/09/21 1928  NA 140  K 3.6  CL 108  CO2 21*  GLUCOSE 84  BUN 29*  CREATININE 1.24*  CALCIUM 9.6   GFR: CrCl cannot be calculated (Unknown ideal weight.). Liver Function Tests: Recent Labs  Lab 02/09/21 1928  AST 15  ALT 14  ALKPHOS 134*  BILITOT 1.0  PROT 7.5  ALBUMIN 3.3*   Recent Labs  Lab 02/09/21 1928  LIPASE 25   No results for input(s): AMMONIA in the last 168 hours. Coagulation Profile: No results for input(s): INR, PROTIME in the last 168 hours. Cardiac Enzymes: No results for input(s): CKTOTAL, CKMB, CKMBINDEX, TROPONINI in the last 168 hours. BNP (last 3 results) No results for input(s): PROBNP in the last 8760 hours. HbA1C: No results for input(s): HGBA1C in the last 72 hours. CBG: No results for input(s): GLUCAP in the last 168 hours. Lipid Profile: No results for input(s): CHOL, HDL, LDLCALC, TRIG, CHOLHDL, LDLDIRECT in the last 72 hours. Thyroid Function Tests: No results for input(s): TSH, T4TOTAL, FREET4, T3FREE, THYROIDAB in the last 72 hours. Anemia Panel: No results for input(s): VITAMINB12, FOLATE, FERRITIN, TIBC, IRON, RETICCTPCT in the last 72 hours. Urine analysis:    Component Value Date/Time   COLORURINE YELLOW 02/09/2021 2035   APPEARANCEUR HAZY (A) 02/09/2021 2035   LABSPEC 1.033 (H) 02/09/2021 2035   PHURINE 5.0 02/09/2021 2035   GLUCOSEU NEGATIVE 02/09/2021 2035   HGBUR NEGATIVE 02/09/2021 2035   BILIRUBINUR NEGATIVE 02/09/2021 2035   KETONESUR 5 (A) 02/09/2021 2035   PROTEINUR >=300 (A) 02/09/2021 2035   NITRITE NEGATIVE 02/09/2021 2035  LEUKOCYTESUR NEGATIVE 02/09/2021 2035   Sepsis Labs: _0 (procalcitonin:4,lacticidven:4) ) Recent Results (from the past 240 hour(s))  Resp Panel by RT-PCR (Flu A&B, Covid) Nasopharyngeal Swab     Status: None   Collection Time:  02/10/21 12:16 AM   Specimen: Nasopharyngeal Swab; Nasopharyngeal(NP) swabs in vial transport medium  Result Value Ref Range Status   SARS Coronavirus 2 by RT PCR NEGATIVE NEGATIVE Final    Comment: (NOTE) SARS-CoV-2 target nucleic acids are NOT DETECTED.  The SARS-CoV-2 RNA is generally detectable in upper respiratory specimens during the acute phase of infection. The lowest concentration of SARS-CoV-2 viral copies this assay can detect is 138 copies/mL. A negative result does not preclude SARS-Cov-2 infection and should not be used as the sole basis for treatment or other patient management decisions. A negative result may occur with  improper specimen collection/handling, submission of specimen other than nasopharyngeal swab, presence of viral mutation(s) within the areas targeted by this assay, and inadequate number of viral copies(<138 copies/mL). A negative result must be combined with clinical observations, patient history, and epidemiological information. The expected result is Negative.  Fact Sheet for Patients:  EntrepreneurPulse.com.au  Fact Sheet for Healthcare Providers:  IncredibleEmployment.be  This test is no t yet approved or cleared by the [place] FDA and  has been authorized for detection and/or diagnosis of SARS-CoV-2 by FDA under an Emergency Use Authorization (EUA). This EUA will remain  in effect (meaning this test can be used) for the duration of the COVID-19 declaration under Section 564(b)(1) of the Act, 21 U.S.C.section 360bbb-3(b)(1), unless the authorization is terminated  or revoked sooner.       Influenza A by PCR NEGATIVE NEGATIVE Final   Influenza B by PCR NEGATIVE NEGATIVE Final    Comment: (NOTE) The Xpert Xpress SARS-CoV-2/FLU/RSV plus assay is intended as an aid in the diagnosis of influenza from Nasopharyngeal swab specimens and should not be used as a sole basis for treatment. Nasal washings  and aspirates are unacceptable for Xpert Xpress SARS-CoV-2/FLU/RSV testing.  Fact Sheet for Patients: EntrepreneurPulse.com.au  Fact Sheet for Healthcare Providers: IncredibleEmployment.be  This test is not yet approved or cleared by the [place] FDA and has been authorized for detection and/or diagnosis of SARS-CoV-2 by FDA under an Emergency Use Authorization (EUA). This EUA will remain in effect (meaning this test can be used) for the duration of the COVID-19 declaration under Section 564(b)(1) of the Act, 21 U.S.C. section 360bbb-3(b)(1), unless the authorization is terminated or revoked.  Performed at [facility], [place] [address], [place], [place] [address]      Radiological Exams on Admission: DG Chest Port 1 View  Result Date: 02/09/2021 CLINICAL DATA:  Chest pain. EXAM: PORTABLE CHEST 1 VIEW COMPARISON:  December 18, 2020. FINDINGS: The heart size and mediastinal contours are within normal limits. Both lungs are clear. No pneumothorax or pleural effusion is noted. The visualized skeletal structures are unremarkable. IMPRESSION: No active disease. Electronically Signed   By: Marijo Conception M.D.   On: 02/09/2021 19:15   CT Angio Chest/Abd/Pel for Dissection W and/or Wo Contrast  Result Date: 02/09/2021 CLINICAL DATA:  Bilateral shoulder and abdominal pain EXAM: CT ANGIOGRAPHY CHEST, ABDOMEN AND PELVIS TECHNIQUE: Non-contrast CT of the chest was initially obtained. Multidetector CT imaging through the chest, abdomen and pelvis was performed using the standard protocol during bolus administration of intravenous contrast. Multiplanar reconstructed images and MIPs were obtained and reviewed to evaluate the vascular anatomy. CONTRAST:  155m OMNIPAQUE  IOHEXOL 350 MG/ML SOLN COMPARISON:  12/15/2020 FINDINGS: CTA CHEST FINDINGS Cardiovascular: No evidence of thoracic aortic aneurysm or dissection. The heart is unremarkable  without pericardial effusion. No evidence of pulmonary embolus. Mediastinum/Nodes: No pathologic adenopathy. Thyroid is heterogeneous but not enlarged. Trachea and esophagus are unremarkable. Lungs/Pleura: No airspace disease, effusion, or pneumothorax. Central airways are widely patent. Musculoskeletal: There is abnormal paraspinal soft tissue swelling extending from T2 through T4. At the T3/T4 disc space there is endplate irregularity with areas of bony resorption along the right anterolateral aspect of the T4 vertebral body, consistent with discitis/osteomyelitis. There are no acute displaced fractures. Reconstructed images demonstrate no additional findings. Review of the MIP images confirms the above findings. CTA ABDOMEN AND PELVIS FINDINGS VASCULAR Aorta: Normal caliber aorta without aneurysm, dissection, vasculitis or significant stenosis. Mild atherosclerosis. Celiac: Patent without evidence of aneurysm, dissection, vasculitis or significant stenosis. SMA: Patent without evidence of aneurysm, dissection, vasculitis or significant stenosis. Renals: Both renal arteries are patent without evidence of aneurysm, dissection, vasculitis, fibromuscular dysplasia or significant stenosis. IMA: Patent without evidence of aneurysm, dissection, vasculitis or significant stenosis. Inflow: Patent without evidence of aneurysm, dissection, vasculitis or significant stenosis. Veins: There is a tunneled right femoral PICC line, tip extending to the IVC at the level of the hepatic venous confluence. Review of the MIP images confirms the above findings. NON-VASCULAR Hepatobiliary: Chronic pneumobilia unchanged, consistent with previous biliary duct manipulation. Gallbladder surgically absent. No focal liver abnormalities. Pancreas: Mild pancreatic duct dilation unchanged. The pancreatic parenchyma is unremarkable. Spleen: Normal in size without focal abnormality. Adrenals/Urinary Tract: There is bilateral renal cortical  atrophy. No urinary tract calculi or obstructive uropathy. The adrenals and bladder are grossly unremarkable. Stomach/Bowel: Previous postsurgical changes from bowel resections and reanastomosis. No obstruction or ileus. There is diffuse colonic wall thickening, compatible with patient's known history of Crohn disease. Lymphatic: No pathologic adenopathy within the abdomen or pelvis. Reproductive: Status post hysterectomy. No adnexal masses. Other: No free fluid or free gas.  No abdominal wall hernia. Musculoskeletal: No acute or destructive bony lesions. Reconstructed images demonstrate no additional findings. Review of the MIP images confirms the above findings. IMPRESSION: 1. Paraspinal soft tissue swelling and bony irregularities within the upper thoracic spine from T2 through T4, compatible with discitis/osteomyelitis. 2. No evidence of thoracoabdominal aneurysm or dissection. Minimal atherosclerosis is noted. 3. Diffuse colonic wall thickening consistent with colitis, compatible with patient's known history of Crohn disease. Critical Value/emergent results were called by telephone at the time of interpretation on 02/09/2021 at 10:52 pm to provider DR FLOYD, who verbally acknowledged these results. Electronically Signed   By: Randa Ngo M.D.   On: 02/09/2021 22:56    EKG: Independently reviewed.  Normal sinus rhythm with LVH.  Assessment/Plan Principal Problem:   Thoracic discitis Active Problems:   Chronic diarrhea   Crohn's disease of small & large intestine with SGS   Short gut syndrome   Vitamin B12 deficiency   On total parenteral nutrition (TPN)    1. Thoracic discitis -CT scan shows features concerning for discitis.  ER physician discussed with on-call neurosurgeon who advised MRI T-spine which has been ordered and is pending.  Blood cultures have been ordered.  Patient is presently not septic looking so no antibiotics have been started. 2. Hypertension uncontrolled on amlodipine  beta-blockers and hydralazine.  Uncontrolled hypertension may be related to pain.  Follow blood pressure trends.  As needed IV hydralazine has been ordered. 3. History of Crohn's disease on Entocort. 4. Short  gut syndrome on TPN.  Discussed with pharmacy. 5. Chronic kidney disease stage III creatinine appears to be at baseline. 6. Chronic anemia likely nutritional cause and also could be from renal disease.  Follow metabolic panel. 7. Recently admitted for seizures in January 2022 2 months ago on Keppra. 8. Recently admitted about 2 months ago for MRSA bacteremia.  Since patient has possible discitis and will need further work-up and also close monitoring given the recent admission showing MRSA bacteremia will need inpatient status.   DVT prophylaxis: SCDs.  Avoiding anticoagulation in anticipation of possible need for procedure for discitis. Code Status: Full code. Family Communication: Discussed with patient. Disposition Plan: Home. Consults called: ER physician discussed with on-call neurosurgeon. Admission status: Inpatient.   Rise Patience MD [facility] Pager [phone].  If 7PM-7AM, please contact night-coverage [email or web address] Password [facility]  02/10/2021, 3:17 AM

## 2021-02-11 DIAGNOSIS — M4624 Osteomyelitis of vertebra, thoracic region: Secondary | ICD-10-CM | POA: Diagnosis not present

## 2021-02-11 DIAGNOSIS — M4644 Discitis, unspecified, thoracic region: Secondary | ICD-10-CM | POA: Diagnosis not present

## 2021-02-11 LAB — COMPREHENSIVE METABOLIC PANEL
ALT: 12 U/L (ref 0–44)
AST: 14 U/L — ABNORMAL LOW (ref 15–41)
Albumin: 2.6 g/dL — ABNORMAL LOW (ref 3.5–5.0)
Alkaline Phosphatase: 104 U/L (ref 38–126)
Anion gap: 9 (ref 5–15)
BUN: 36 mg/dL — ABNORMAL HIGH (ref 6–20)
CO2: 23 mmol/L (ref 22–32)
Calcium: 9.2 mg/dL (ref 8.9–10.3)
Chloride: 106 mmol/L (ref 98–111)
Creatinine, Ser: 1.33 mg/dL — ABNORMAL HIGH (ref 0.44–1.00)
GFR, Estimated: 46 mL/min — ABNORMAL LOW (ref >60)
Glucose, Bld: 134 mg/dL — ABNORMAL HIGH (ref 70–99)
Potassium: 4.2 mmol/L (ref 3.5–5.1)
Sodium: 138 mmol/L (ref 135–145)
Total Bilirubin: 0.6 mg/dL (ref 0.3–1.2)
Total Protein: 6.5 g/dL (ref 6.5–8.1)

## 2021-02-11 LAB — CBC
HCT: 25.4 % — ABNORMAL LOW (ref 36.0–46.0)
Hemoglobin: 8.1 g/dL — ABNORMAL LOW (ref 12.0–15.0)
MCH: 29.5 pg (ref 26.0–34.0)
MCHC: 31.9 g/dL (ref 30.0–36.0)
MCV: 92.4 fL (ref 80.0–100.0)
Platelets: 228 10*3/uL (ref 150–400)
RBC: 2.75 MIL/uL — ABNORMAL LOW (ref 3.87–5.11)
RDW: 13.2 % (ref 11.5–15.5)
WBC: 4.6 10*3/uL (ref 4.0–10.5)
nRBC: 0 % (ref 0.0–0.2)

## 2021-02-11 LAB — DIFFERENTIAL
Abs Immature Granulocytes: 0.01 10*3/uL (ref 0.00–0.07)
Basophils Absolute: 0 10*3/uL (ref 0.0–0.1)
Basophils Relative: 0 %
Eosinophils Absolute: 0.1 10*3/uL (ref 0.0–0.5)
Eosinophils Relative: 2 %
Immature Granulocytes: 0 %
Lymphocytes Relative: 34 %
Lymphs Abs: 1.6 10*3/uL (ref 0.7–4.0)
Monocytes Absolute: 0.3 10*3/uL (ref 0.1–1.0)
Monocytes Relative: 6 %
Neutro Abs: 2.7 10*3/uL (ref 1.7–7.7)
Neutrophils Relative %: 58 %

## 2021-02-11 LAB — GLUCOSE, CAPILLARY
Glucose-Capillary: 105 mg/dL — ABNORMAL HIGH (ref 70–99)
Glucose-Capillary: 110 mg/dL — ABNORMAL HIGH (ref 70–99)
Glucose-Capillary: 133 mg/dL — ABNORMAL HIGH (ref 70–99)

## 2021-02-11 LAB — PHOSPHORUS: Phosphorus: 2.5 mg/dL (ref 2.5–4.6)

## 2021-02-11 LAB — MAGNESIUM: Magnesium: 1.9 mg/dL (ref 1.7–2.4)

## 2021-02-11 MED ORDER — HYDROMORPHONE HCL 1 MG/ML IJ SOLN
1.0000 mg | INTRAMUSCULAR | Status: DC | PRN
  Administered 2021-02-11 (×2): 1 mg via INTRAVENOUS
  Filled 2021-02-11 (×2): qty 1

## 2021-02-11 MED ORDER — HYDROMORPHONE HCL 1 MG/ML IJ SOLN
0.5000 mg | INTRAMUSCULAR | Status: DC | PRN
  Administered 2021-02-11 – 2021-02-15 (×40): 0.5 mg via INTRAVENOUS
  Filled 2021-02-11 (×41): qty 0.5

## 2021-02-11 MED ORDER — TRAVASOL 10 % IV SOLN
INTRAVENOUS | Status: AC
  Filled 2021-02-11: qty 990

## 2021-02-11 NOTE — Plan of Care (Signed)
  Problem: Activity: Goal: Risk for activity intolerance will decrease Outcome: Progressing   Problem: Nutrition: Goal: Adequate nutrition will be maintained Outcome: Progressing   

## 2021-02-11 NOTE — Progress Notes (Signed)
PROGRESS NOTE    Caitlin Peters  PVX:[id number] DOB: 1961-05-19 DOA: 02/09/2021 PCP: [name], [name], MD   Brief Narrative: Caitlin Peters is a 60 y.o. 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 Patient presented secondary to back pain with evidence of discitis/osteoyelitis on imaging.   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 Complicated by PICC line for TPN. No antibiotics started on admission. Discussed with IR and no role for aspiration in this clinical scenario secondary to location of infection and lack of adequate fluid collection. Neurosurgery signed off. Blood cultures obtained on admission and are pending.  -Infectious disease recommendations: Vancomycin/Cefepime IV -Follow blood cultures -Change to dilaudid 1 mg IV q4 hours prn  Primary hypertension -Continue amlodipine, hydralazine, metoprolol  Chronic pain Patient follows in a pain clinic. -Continue methadone 25 mg daily and Cymbalta 90 mg daily  History of Crohn's disease Follows at [facility] On budesonide as an outpatient -Continue budesonide  CKD stage III Stable. Baseline creatinine of about 1.2-1.3  Short gut syndrome Secondary to surgeries for above. Currently with a right femoral PICC line for TPN. -Continue TPN pending blood culture results  GERD -Continue Protonix  Seizures -Continue Keppra  50 minutes spent in patient's room at bedside listening to and discussing plan with patient.   DVT prophylaxis: SCDs Code Status:   Code Status: Full Code Family Communication: None at bedside Disposition Plan: Discharge home likely in 2-3 days pending ID  recommendations and culture data   Consultants:   Neurosurgery  Infectious disease  Procedures:   None  Antimicrobials:  None    Subjective: Continues to have back pain that radiates to front of chest and arms. Discussed allergies to gabapentin and Lyrica for which she does not remember. Patient feels that a lot was done to her including trial and error medicine and that she was made to be the way she is currently. She states she needs more IV pain medication as her pain is not well controlled. She requests dilaudid 1 mg q2 hours prn. We discussed palliative care consult for pain management for which she declined.  Objective: Vitals:   02/10/21 1335 02/10/21 1806 02/10/21 2144 02/11/21 0605  BP: (!) 188/78 (!) 188/78 (!) 159/73 (!) 160/63  Pulse: 64 64 71 67  Resp: 15 15 20 13   Temp: 98.3 F (36.8 C) 98.3 F (36.8 C) 99 F (37.2 C) 99.1 F (37.3 C)  TempSrc:  Oral Oral Oral  SpO2: 100%   99%  Weight:  67.5 kg    Height:  5' 1"  (1.549 m)      Intake/Output Summary (Last 24 hours) at 02/11/2021 1002 Last data filed at 02/11/2021 0600 Gross per 24 hour  Intake 2021.79 ml  Output 950 ml  Net 1071.79 ml   Filed Weights   02/10/21 1210 02/10/21 1806  Weight: 67.5 kg 67.5 kg    Examination:  General: Chronically ill appearing, no distress Respiratory: Unlabored work of breathing.   Data Reviewed: I have personally reviewed following labs and imaging studies  CBC Lab Results  Component Value Date   WBC 4.6 02/11/2021   RBC 2.75 (L) 02/11/2021  HGB 8.1 (L) 02/11/2021   HCT 25.4 (L) 02/11/2021   MCV 92.4 02/11/2021   MCH 29.5 02/11/2021   PLT 228 02/11/2021   MCHC 31.9 02/11/2021   RDW 13.2 02/11/2021   LYMPHSABS 1.6 02/11/2021   MONOABS 0.3 02/11/2021   EOSABS 0.1 02/11/2021   BASOSABS 0.0 35/36/1443     Last metabolic panel Lab Results  Component Value Date   NA 138 02/11/2021   K 4.2 02/11/2021   CL 106 02/11/2021   CO2 23 02/11/2021   BUN  36 (H) 02/11/2021   CREATININE 1.33 (H) 02/11/2021   GLUCOSE 134 (H) 02/11/2021   GFRNONAA 46 (L) 02/11/2021   GFRAA 55 (L) 08/12/2020   CALCIUM 9.2 02/11/2021   PHOS 2.5 02/11/2021   PROT 6.5 02/11/2021   ALBUMIN 2.6 (L) 02/11/2021   BILITOT 0.6 02/11/2021   ALKPHOS 104 02/11/2021   AST 14 (L) 02/11/2021   ALT 12 02/11/2021   ANIONGAP 9 02/11/2021    CBG (last 3)  Recent Labs    02/10/21 1724 02/11/21 0008 02/11/21 0755  GLUCAP 108* 133* 110*     GFR: Estimated Creatinine Clearance: 40 mL/min (A) (by C-G formula based on SCr of 1.33 mg/dL (H)).  Coagulation Profile: No results for input(s): INR, PROTIME in the last 168 hours.  Recent Results (from the past 240 hour(s))  Resp Panel by RT-PCR (Flu A&B, Covid) Nasopharyngeal Swab     Status: None   Collection Time: 02/10/21 12:16 AM   Specimen: Nasopharyngeal Swab; Nasopharyngeal(NP) swabs in vial transport medium  Result Value Ref Range Status   SARS Coronavirus 2 by RT PCR NEGATIVE NEGATIVE Final    Comment: (NOTE) SARS-CoV-2 target nucleic acids are NOT DETECTED.  The SARS-CoV-2 RNA is generally detectable in upper respiratory specimens during the acute phase of infection. The lowest concentration of SARS-CoV-2 viral copies this assay can detect is 138 copies/mL. A negative result does not preclude SARS-Cov-2 infection and should not be used as the sole basis for treatment or other patient management decisions. A negative result may occur with  improper specimen collection/handling, submission of specimen other than nasopharyngeal swab, presence of viral mutation(s) within the areas targeted by this assay, and inadequate number of viral copies(<138 copies/mL). A negative result must be combined with clinical observations, patient history, and epidemiological information. The expected result is Negative.  Fact Sheet for Patients:  EntrepreneurPulse.com.au  Fact Sheet for Healthcare Providers:   IncredibleEmployment.be  This test is no t yet approved or cleared by the [place] FDA and  has been authorized for detection and/or diagnosis of SARS-CoV-2 by FDA under an Emergency Use Authorization (EUA). This EUA will remain  in effect (meaning this test can be used) for the duration of the COVID-19 declaration under Section 564(b)(1) of the Act, 21 U.S.C.section 360bbb-3(b)(1), unless the authorization is terminated  or revoked sooner.       Influenza A by PCR NEGATIVE NEGATIVE Final   Influenza B by PCR NEGATIVE NEGATIVE Final    Comment: (NOTE) The Xpert Xpress SARS-CoV-2/FLU/RSV plus assay is intended as an aid in the diagnosis of influenza from Nasopharyngeal swab specimens and should not be used as a sole basis for treatment. Nasal washings and aspirates are unacceptable for Xpert Xpress SARS-CoV-2/FLU/RSV testing.  Fact Sheet for Patients: EntrepreneurPulse.com.au  Fact Sheet for Healthcare Providers: IncredibleEmployment.be  This test is not yet approved or cleared by the [place] FDA and has been authorized for detection and/or diagnosis of SARS-CoV-2 by FDA  under an Emergency Use Authorization (EUA). This EUA will remain in effect (meaning this test can be used) for the duration of the COVID-19 declaration under Section 564(b)(1) of the Act, 21 U.S.C. section 360bbb-3(b)(1), unless the authorization is terminated or revoked.  Performed at [facility], [place] [address] [place], [place] [address]         Radiology Studies: MR THORACIC SPINE W WO CONTRAST  Result Date: 02/10/2021 CLINICAL DATA:  Mid back pain, abnormal CT EXAM: MRI THORACIC WITHOUT AND WITH CONTRAST TECHNIQUE: Multiplanar and multiecho pulse sequences of the thoracic spine were obtained without and with intravenous contrast. CONTRAST:  13m GADAVIST GADOBUTROL 1 MMOL/ML IV SOLN COMPARISON:  None. FINDINGS:  Alignment:  Preserved. Vertebrae: There is abnormal marrow signal and enhancement at T3 and T4 with endplate irregularity prior CT. There is also abnormal signal at the posterior aspect of the T2 vertebral body and along the superior T5 endplate. Cord:  No abnormal signal. Paraspinal and other soft tissues: Paraspinal edema and enhancement centered at the above levels. No evidence of abscess. Disc levels: Mild T2 hyperintensity and enhancement within the T3-T4 and T4-T5 disc spaces. There is ventral epidural enhancement from T2-T3 to T4-T5 without significant canal stenosis. IMPRESSION: Evidence of discitis/osteomyelitis from T2-T5. Paravertebral and ventral epidural phlegmon. No significant canal stenosis. No abscess. Electronically Signed   By: PMacy MisM.D.   On: 02/10/2021 11:46   DG Chest Port 1 View  Result Date: 02/09/2021 CLINICAL DATA:  Chest pain. EXAM: PORTABLE CHEST 1 VIEW COMPARISON:  December 18, 2020. FINDINGS: The heart size and mediastinal contours are within normal limits. Both lungs are clear. No pneumothorax or pleural effusion is noted. The visualized skeletal structures are unremarkable. IMPRESSION: No active disease. Electronically Signed   By: JMarijo ConceptionM.D.   On: 02/09/2021 19:15   CT Angio Chest/Abd/Pel for Dissection W and/or Wo Contrast  Result Date: 02/09/2021 CLINICAL DATA:  Bilateral shoulder and abdominal pain EXAM: CT ANGIOGRAPHY CHEST, ABDOMEN AND PELVIS TECHNIQUE: Non-contrast CT of the chest was initially obtained. Multidetector CT imaging through the chest, abdomen and pelvis was performed using the standard protocol during bolus administration of intravenous contrast. Multiplanar reconstructed images and MIPs were obtained and reviewed to evaluate the vascular anatomy. CONTRAST:  1045mOMNIPAQUE IOHEXOL 350 MG/ML SOLN COMPARISON:  12/15/2020 FINDINGS: CTA CHEST FINDINGS Cardiovascular: No evidence of thoracic aortic aneurysm or dissection. The heart is  unremarkable without pericardial effusion. No evidence of pulmonary embolus. Mediastinum/Nodes: No pathologic adenopathy. Thyroid is heterogeneous but not enlarged. Trachea and esophagus are unremarkable. Lungs/Pleura: No airspace disease, effusion, or pneumothorax. Central airways are widely patent. Musculoskeletal: There is abnormal paraspinal soft tissue swelling extending from T2 through T4. At the T3/T4 disc space there is endplate irregularity with areas of bony resorption along the right anterolateral aspect of the T4 vertebral body, consistent with discitis/osteomyelitis. There are no acute displaced fractures. Reconstructed images demonstrate no additional findings. Review of the MIP images confirms the above findings. CTA ABDOMEN AND PELVIS FINDINGS VASCULAR Aorta: Normal caliber aorta without aneurysm, dissection, vasculitis or significant stenosis. Mild atherosclerosis. Celiac: Patent without evidence of aneurysm, dissection, vasculitis or significant stenosis. SMA: Patent without evidence of aneurysm, dissection, vasculitis or significant stenosis. Renals: Both renal arteries are patent without evidence of aneurysm, dissection, vasculitis, fibromuscular dysplasia or significant stenosis. IMA: Patent without evidence of aneurysm, dissection, vasculitis or significant stenosis. Inflow: Patent without evidence of aneurysm, dissection, vasculitis or significant stenosis. Veins: There is a tunneled  right femoral PICC line, tip extending to the IVC at the level of the hepatic venous confluence. Review of the MIP images confirms the above findings. NON-VASCULAR Hepatobiliary: Chronic pneumobilia unchanged, consistent with previous biliary duct manipulation. Gallbladder surgically absent. No focal liver abnormalities. Pancreas: Mild pancreatic duct dilation unchanged. The pancreatic parenchyma is unremarkable. Spleen: Normal in size without focal abnormality. Adrenals/Urinary Tract: There is bilateral renal  cortical atrophy. No urinary tract calculi or obstructive uropathy. The adrenals and bladder are grossly unremarkable. Stomach/Bowel: Previous postsurgical changes from bowel resections and reanastomosis. No obstruction or ileus. There is diffuse colonic wall thickening, compatible with patient's known history of Crohn disease. Lymphatic: No pathologic adenopathy within the abdomen or pelvis. Reproductive: Status post hysterectomy. No adnexal masses. Other: No free fluid or free gas.  No abdominal wall hernia. Musculoskeletal: No acute or destructive bony lesions. Reconstructed images demonstrate no additional findings. Review of the MIP images confirms the above findings. IMPRESSION: 1. Paraspinal soft tissue swelling and bony irregularities within the upper thoracic spine from T2 through T4, compatible with discitis/osteomyelitis. 2. No evidence of thoracoabdominal aneurysm or dissection. Minimal atherosclerosis is noted. 3. Diffuse colonic wall thickening consistent with colitis, compatible with patient's known history of Crohn disease. Critical Value/emergent results were called by telephone at the time of interpretation on 02/09/2021 at 10:52 pm to provider DR FLOYD, who verbally acknowledged these results. Electronically Signed   By: Randa Ngo M.D.   On: 02/09/2021 22:56        Scheduled Meds: . amLODipine  10 mg Oral Daily  . budesonide  9 mg Oral QODAY  . calcium carbonate  1 tablet Oral Daily  . Chlorhexidine Gluconate Cloth  6 each Topical Daily  . cholecalciferol  1,000 Units Oral Daily  . cycloSPORINE  1 drop Both Eyes BID  . DULoxetine  90 mg Oral Daily  . hydrALAZINE  75 mg Oral TID  . insulin aspart  0-9 Units Subcutaneous Q8H  . levETIRAcetam  500 mg Oral BID  . methadone  5 mg Oral 5 X Daily  . metoprolol tartrate  100 mg Oral BID  . pantoprazole  40 mg Oral Daily  . polyvinyl alcohol  1 drop Both Eyes QID  . sucralfate  1 g Oral TID WC & HS  . vitamin B-12  1,000 mcg Oral  Daily   Continuous Infusions: . ceFEPime (MAXIPIME) IV 2 g (02/11/21 0849)  . TPN CYCLIC-ADULT (ION)    . vancomycin       LOS: 1 day     Cordelia Poche, MD [facility] 02/11/2021, 10:02 AM  If 7PM-7AM, please contact night-coverage [email or web address]

## 2021-02-11 NOTE — Progress Notes (Signed)
PHARMACY - TOTAL PARENTERAL NUTRITION CONSULT NOTE   Indication: Short bowel syndrome  Patient Measurements:  Height: 5' 1"  (154.9 cm) Weight: 67.5 kg (148 lb 13 oz) IBW/kg (Calculated) : 47.8 TPN AdjBW (KG): 52.7 Body mass index is 28.12 kg/m.  Assessment: 38 yoF, hx of Short gut syndrome and Crohn's disease on chronic cyclic TPN since 3794. She can tolerate PO, but only very small quantities. Teduglutide started January 2022 for short bowel syndrome. Last dose unknown, reported as within the past week.  She is admitted on 3/18 for worsening back pain with CT concerning for thoracic discitis/osteomyelitis. Pharmacy is consulted to continue TPN.  Glucose / Insulin: Glucose at goal (< 150), 0 units SSI required since TPN resumption. Previously did not require insulin.  Electrolytes: Lytes WNL Renal: SCr increased to 1.33 (baseline appears to be ~1.1) Hepatic: WNL, TG 137 (3/18) Prealbumin: 20 (3/18) previously 18.3 (Jan 2022) Intake / Output; MIVF: no IVF, I/O + 657m GI Imaging: 3/17 Diffuse colonic wall thickening consistent with colitis, compatible with patient's known history of Crohn disease. GI Surgeries / Procedures:  Medications:  Budesonide QOD, Tums QD, PPI QD, Sucralfate QID, D3, B12.  Creon PRN (no scheduled doses), loperamide prn, lomotil prn  Central access: femoral CVC TPN start date: continued from home  Nutritional Goals (RD recommendation on 3/18):   Kcal:  1875-2025 kcal, Protein:  95-105 grams, Fluid:  >/= 2 L/day Goal Cyclic TPN is 13276mL over 12 hr to provides 99 g of protein and 1947 kcals per day.   Outpatient [facility] TPN was providing:  1800 mL TPN over 12 hours daily.  Lipids on MWF only.  85 g protein daily and 7 day average 1292 kcal daily  Current Nutrition:  Regular diet, TPN  Plan:  At 11470 continue cyclic TPN:  - Infuse 19295mL over 12 hrs with 1 hour ramp up and 1 hour ramp down. - Max infusion rate 164 ml/hr Electrolytes in TPN: Na  72m/L, K 5079mL, Ca 5mE48m, Mg 5mEq7m and Phos 15mmo50m Cl:Ac ratio 1:2 Add standard MVI and trace elements to TPN Continue q8h CBGs and sensitive SSI and adjust as needed Monitor TPN labs on Mon/Thurs. CMET, Mag, Phos tomorrow AM.   Syrah Daughtrey Lindell SparmD, BCPS Clinical Pharmacist 02/11/2021 9:27 AM

## 2021-02-12 DIAGNOSIS — M4644 Discitis, unspecified, thoracic region: Secondary | ICD-10-CM | POA: Diagnosis not present

## 2021-02-12 DIAGNOSIS — K50819 Crohn's disease of both small and large intestine with unspecified complications: Secondary | ICD-10-CM

## 2021-02-12 DIAGNOSIS — E538 Deficiency of other specified B group vitamins: Secondary | ICD-10-CM

## 2021-02-12 DIAGNOSIS — M4624 Osteomyelitis of vertebra, thoracic region: Secondary | ICD-10-CM | POA: Diagnosis not present

## 2021-02-12 DIAGNOSIS — Z789 Other specified health status: Secondary | ICD-10-CM

## 2021-02-12 LAB — COMPREHENSIVE METABOLIC PANEL
ALT: 12 U/L (ref 0–44)
AST: 13 U/L — ABNORMAL LOW (ref 15–41)
Albumin: 2.6 g/dL — ABNORMAL LOW (ref 3.5–5.0)
Alkaline Phosphatase: 97 U/L (ref 38–126)
Anion gap: 10 (ref 5–15)
BUN: 38 mg/dL — ABNORMAL HIGH (ref 6–20)
CO2: 21 mmol/L — ABNORMAL LOW (ref 22–32)
Calcium: 9 mg/dL (ref 8.9–10.3)
Chloride: 106 mmol/L (ref 98–111)
Creatinine, Ser: 1.19 mg/dL — ABNORMAL HIGH (ref 0.44–1.00)
GFR, Estimated: 53 mL/min — ABNORMAL LOW (ref >60)
Glucose, Bld: 116 mg/dL — ABNORMAL HIGH (ref 70–99)
Potassium: 3.6 mmol/L (ref 3.5–5.1)
Sodium: 137 mmol/L (ref 135–145)
Total Bilirubin: 0.5 mg/dL (ref 0.3–1.2)
Total Protein: 6.3 g/dL — ABNORMAL LOW (ref 6.5–8.1)

## 2021-02-12 LAB — GLUCOSE, CAPILLARY
Glucose-Capillary: 103 mg/dL — ABNORMAL HIGH (ref 70–99)
Glucose-Capillary: 113 mg/dL — ABNORMAL HIGH (ref 70–99)
Glucose-Capillary: 134 mg/dL — ABNORMAL HIGH (ref 70–99)

## 2021-02-12 LAB — PHOSPHORUS: Phosphorus: 3 mg/dL (ref 2.5–4.6)

## 2021-02-12 LAB — MAGNESIUM: Magnesium: 1.8 mg/dL (ref 1.7–2.4)

## 2021-02-12 MED ORDER — TRAVASOL 10 % IV SOLN
INTRAVENOUS | Status: AC
  Filled 2021-02-12: qty 972

## 2021-02-12 NOTE — Evaluation (Signed)
Physical Therapy Evaluation Patient Details Name: Caitlin Peters MRN: [id number] DOB: 01/01/1961 Today's Date: 02/12/2021   History of Present Illness  Pt admitted with back pain 2* T2-T5 discitis and oxteomyelitis.  Pt with hx of Crohns Dz, Short gut syndrome, and Picc line on thigh (TPN)  Clinical Impression  Pt admitted as above and presenting with functional mobility limitations 2* ongoing back pain and ambulatory balance deficits.  Pt should progress to dc home with family assist.    Follow Up Recommendations No PT follow up    Equipment Recommendations  None recommended by PT    Recommendations for Other Services       Precautions / Restrictions Precautions Precautions: Fall Restrictions Weight Bearing Restrictions: No      Mobility  Bed Mobility Overal bed mobility: Modified Independent             General bed mobility comments: No assist in/out bed    Transfers Overall transfer level: Needs assistance Equipment used: None Transfers: Sit to/from Stand Sit to Stand: Min guard         General transfer comment: Steady assist only  Ambulation/Gait Ambulation/Gait assistance: Min guard Gait Distance (Feet): 450 Feet Assistive device: IV Pole Gait Pattern/deviations: Step-through pattern;Shuffle;Trunk flexed     General Gait Details: min steady assist only with use of IV pole  Stairs            Wheelchair Mobility    Modified Rankin (Stroke Patients Only)       Balance Overall balance assessment: Needs assistance Sitting-balance support: No upper extremity supported;Feet supported Sitting balance-Leahy Scale: Good     Standing balance support: No upper extremity supported Standing balance-Leahy Scale: Fair                               Pertinent Vitals/Pain Pain Assessment: 0-10 Pain Score: 7  Pain Location: back pain Pain Descriptors / Indicators: Aching;Sore Pain Intervention(s): Limited activity within patient's  tolerance;Monitored during session;Premedicated before session;Patient requesting pain meds-RN notified    Home Living Family/patient expects to be discharged to:: Private residence Living Arrangements: Children;Other relatives Available Help at Discharge: Family;Available 24 hours/day Type of Home: Apartment Home Access: Stairs to enter Entrance Stairs-Rails: Right Entrance Stairs-Number of Steps: flight Home Layout: One level Home Equipment: Walker - 2 wheels;Walker - 4 wheels      Prior Function Level of Independence: Needs assistance   Gait / Transfers Assistance Needed: Uses walker on a "bad day" - but predominantly does not need. Uses rollator when going out.  ADL's / Homemaking Assistance Needed: Reports family watches her in the shower and assists her off the commode or out of the bed if needed "on a bad day."  Comments: walks her dog     Hand Dominance   Dominant Hand: Right    Extremity/Trunk Assessment   Upper Extremity Assessment Upper Extremity Assessment: Overall WFL for tasks assessed    Lower Extremity Assessment Lower Extremity Assessment: Overall WFL for tasks assessed       Communication   Communication: No difficulties  Cognition Arousal/Alertness: Awake/alert Behavior During Therapy: WFL for tasks assessed/performed Overall Cognitive Status: Within Functional Limits for tasks assessed                                        General Comments  Exercises     Assessment/Plan    PT Assessment Patient needs continued PT services  PT Problem List Decreased balance;Pain       PT Treatment Interventions DME instruction;Gait training;Stair training;Functional mobility training;Therapeutic activities;Therapeutic exercise;Patient/family education    PT Goals (Current goals can be found in the Care Plan section)  Acute Rehab PT Goals Patient Stated Goal: HOME PT Goal Formulation: With patient Time For Goal Achievement:  02/26/21 Potential to Achieve Goals: Good    Frequency Min 3X/week   Barriers to discharge        Co-evaluation               AM-PAC PT "6 Clicks" Mobility  Outcome Measure Help needed turning from your back to your side while in a flat bed without using bedrails?: None Help needed moving from lying on your back to sitting on the side of a flat bed without using bedrails?: None Help needed moving to and from a bed to a chair (including a wheelchair)?: A Little Help needed standing up from a chair using your arms (e.g., wheelchair or bedside chair)?: A Little Help needed to walk in hospital room?: A Little Help needed climbing 3-5 steps with a railing? : A Little 6 Click Score: 20    End of Session Equipment Utilized During Treatment: Gait belt Activity Tolerance: Patient tolerated treatment well Patient left: in bed;with call bell/phone within reach;with bed alarm set Nurse Communication: Mobility status PT Visit Diagnosis: Difficulty in walking, not elsewhere classified (R26.2)    Time: 1350-1405 PT Time Calculation (min) (ACUTE ONLY): 15 min   Charges:   PT Evaluation $PT Eval Low Complexity: 1 Low          [place] Pager [phone] Office [phone]   Nayib Remer 02/12/2021, 3:23 PM

## 2021-02-12 NOTE — Progress Notes (Signed)
PHARMACY - TOTAL PARENTERAL NUTRITION CONSULT NOTE   Indication: Short bowel syndrome  Patient Measurements:  Height: 5' 1"  (154.9 cm) Weight: 67.5 kg (148 lb 13 oz) IBW/kg (Calculated) : 47.8 TPN AdjBW (KG): 52.7 Body mass index is 28.12 kg/m.  Assessment: [name], hx of Short gut syndrome and Crohn's disease on chronic cyclic TPN since 1504. She can tolerate PO, but only very small quantities. Teduglutide started January 2022 for short bowel syndrome. Last dose unknown, reported as within the past week.  She is admitted on 3/18 for worsening back pain with CT concerning for thoracic discitis/osteomyelitis. Pharmacy is consulted to continue TPN.  Glucose / Insulin: Glucose at goal (< 150), 1 unit SSI required in past 24 hours. Electrolytes: K+ and Mag decreased to lower end of normal range, CO2 low at 21, other lytes WNL Renal: SCr decreased to 1.19 (baseline appears to be ~1.1) Hepatic: WNL, TG 137 (3/18) Prealbumin: 20 (3/18) previously 18.3 (Jan 2022) Intake / Output; MIVF: no IVF, UOP not quantified GI Imaging: 3/17 CT: Diffuse colonic wall thickening consistent with colitis, compatible with patient's known history of Crohn disease. GI Surgeries / Procedures:  Medications:  Budesonide QOD, Tums QD, PPI QD, Sucralfate QID, D3, B12.  Creon PRN (no scheduled doses), loperamide prn, lomotil prn  Central access: femoral CVC TPN start date: continued from home  Nutritional Goals (RD recommendation on 3/18):   Kcal:  1875-2025 kcal, Protein:  95-105 grams, Fluid:  >/= 2 L/day Goal Cyclic TPN is 1364 mL over 12 hr to provides 97 g of protein and 1897 kcals per day.   Outpatient [facility] TPN was providing:  1800 mL TPN over 12 hours daily.  Lipids on MWF only.  85 g protein daily and 7 day average 1292 kcal daily  Current Nutrition:  Regular diet, TPN  Plan:  At 3837, continue cyclic TPN:  - Infuse 7939 mL over 12 hrs with 1 hour ramp up and 1 hour ramp down. - Max infusion rate  164 ml/hr Electrolytes in TPN: Na 68mq/L, K 678m/L, Ca 87m487mL, Mg 7mE34m, and Phos 187mm77m. Cl:Ac ratio: Max Acetate  Add standard MVI and trace elements to TPN Continue q8h CBGs and sensitive SSI and adjust as needed Monitor TPN labs on Mon/Thurs.    JignaLindell SparrmD, BCPS Clinical Pharmacist 02/12/2021 9:08 AM

## 2021-02-12 NOTE — Progress Notes (Signed)
PROGRESS NOTE    Caitlin Peters  KHT:[id number] DOB: [birth year] DOA: 02/09/2021 PCP: [name], [name], MD   Brief Narrative: Caitlin Peters is a 60 y.o. 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 Patient presented secondary to back pain with evidence of discitis/osteoyelitis on imaging.   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 Complicated by PICC line for TPN. No antibiotics started on admission. Discussed with IR and no role for aspiration in this clinical scenario secondary to location of infection and lack of adequate fluid collection. Neurosurgery signed off. Blood cultures obtained on admission and are no growth to date On vancomycin and cefepime per infectious disease recommendations. -Infectious disease recommendations: Vancomycin/Cefepime IV -Follow blood cultures -Dilaudid 0.5 mg IV q2 hours prn  Primary hypertension -Continue amlodipine, hydralazine, metoprolol  Chronic pain Patient follows in a pain clinic. -Continue methadone 5 mg five times daily and Cymbalta 90 mg daily  History of Crohn's disease [facility]. On budesonide as an outpatient -Continue budesonide  CKD stage III Stable. Baseline creatinine of about 1.2-1.3  Short gut syndrome Secondary to surgeries for above. Currently with a right femoral PICC line for TPN. -Continue TPN pending blood culture results  GERD -Continue Protonix  Seizures -Continue Keppra  DVT prophylaxis: SCDs Code Status:   Code Status: Full Code Family Communication: None at bedside Disposition Plan: Discharge home likely in 1-2 days pending ID recommendations and culture  data   Consultants:   Neurosurgery  Infectious disease  Procedures:   None  Antimicrobials:  None    Subjective: Some chest pain overnight. No other concerns.  Objective: Vitals:   02/11/21 0605 02/11/21 1400 02/11/21 2126 02/12/21 0553  BP: (!) 160/63 (!) 148/75 (!) 146/70 (!) 157/64  Pulse: 67 62 69 73  Resp: 13 14 16 16   Temp: 99.1 F (37.3 C) 98.8 F (37.1 C) 98.7 F (37.1 C) 98.7 F (37.1 C)  TempSrc: Oral  Oral   SpO2: 99% 100% 99% 100%  Weight:      Height:        Intake/Output Summary (Last 24 hours) at 02/12/2021 1235 Last data filed at 02/12/2021 0940 Gross per 24 hour  Intake 2240.05 ml  Output --  Net 2240.05 ml   Filed Weights   02/10/21 1210 02/10/21 1806  Weight: 67.5 kg 67.5 kg    Examination:  General exam: Appears calm and comfortable. Chronically ill appearing Respiratory system: Clear to auscultation. Respiratory effort normal. Cardiovascular system: S1 & S2 heard, RRR. No murmurs, rubs, gallops or clicks. Gastrointestinal system: Abdomen is nondistended, soft and nontender. No organomegaly or masses felt. Normal bowel sounds heard. Central nervous system: Alert and oriented. No focal neurological deficits. Musculoskeletal: No edema. No calf tenderness Skin: No cyanosis. No rashes Psychiatry: Judgement and insight appear normal. Mood & affect appropriate.    Data Reviewed: I have personally reviewed following labs and imaging studies  CBC Lab Results  Component Value Date   WBC 4.6 02/11/2021   RBC 2.75 (L) 02/11/2021   HGB 8.1 (L) 02/11/2021   HCT 25.4 (L) 02/11/2021   MCV 92.4 02/11/2021   MCH 29.5 02/11/2021  PLT 228 02/11/2021   MCHC 31.9 02/11/2021   RDW 13.2 02/11/2021   LYMPHSABS 1.6 02/11/2021   MONOABS 0.3 02/11/2021   EOSABS 0.1 02/11/2021   BASOSABS 0.0 64/40/3474     Last metabolic panel Lab Results  Component Value Date   NA 137 02/12/2021   K 3.6 02/12/2021   CL 106 02/12/2021   CO2 21 (L)  02/12/2021   BUN 38 (H) 02/12/2021   CREATININE 1.19 (H) 02/12/2021   GLUCOSE 116 (H) 02/12/2021   GFRNONAA 53 (L) 02/12/2021   GFRAA 55 (L) 08/12/2020   CALCIUM 9.0 02/12/2021   PHOS 3.0 02/12/2021   PROT 6.3 (L) 02/12/2021   ALBUMIN 2.6 (L) 02/12/2021   BILITOT 0.5 02/12/2021   ALKPHOS 97 02/12/2021   AST 13 (L) 02/12/2021   ALT 12 02/12/2021   ANIONGAP 10 02/12/2021    CBG (last 3)  Recent Labs    02/11/21 1616 02/12/21 0000 02/12/21 0959  GLUCAP 105* 134* 113*     GFR: Estimated Creatinine Clearance: 44.8 mL/min (A) (by C-G formula based on SCr of 1.19 mg/dL (H)).  Coagulation Profile: No results for input(s): INR, PROTIME in the last 168 hours.  Recent Results (from the past 240 hour(s))  Blood culture (routine x 2)     Status: None (Preliminary result)   Collection Time: 02/10/21 12:16 AM   Specimen: BLOOD LEFT FOREARM  Result Value Ref Range Status   Specimen Description   Final    BLOOD LEFT FOREARM Performed at [facility] [address], [place], [place] [address]    Special Requests   Final    BOTTLES DRAWN AEROBIC AND ANAEROBIC Blood Culture adequate volume Performed at [facility] [address], [place], [place] [address]    Culture   Final    NO GROWTH 1 DAY Performed at [facility], [place] [address] [place], [place] [address]    Report Status PENDING  Incomplete  Resp Panel by RT-PCR (Flu A&B, Covid) Nasopharyngeal Swab     Status: None   Collection Time: 02/10/21 12:16 AM   Specimen: Nasopharyngeal Swab; Nasopharyngeal(NP) swabs in vial transport medium  Result Value Ref Range Status   SARS Coronavirus 2 by RT PCR NEGATIVE NEGATIVE Final    Comment: (NOTE) SARS-CoV-2 target nucleic acids are NOT DETECTED.  The SARS-CoV-2 RNA is generally detectable in upper respiratory specimens during the acute phase of infection. The lowest concentration of SARS-CoV-2 viral copies this assay can detect  is 138 copies/mL. A negative result does not preclude SARS-Cov-2 infection and should not be used as the sole basis for treatment or other patient management decisions. A negative result may occur with  improper specimen collection/handling, submission of specimen other than nasopharyngeal swab, presence of viral mutation(s) within the areas targeted by this assay, and inadequate number of viral copies(<138 copies/mL). A negative result must be combined with clinical observations, patient history, and epidemiological information. The expected result is Negative.  Fact Sheet for Patients:  EntrepreneurPulse.com.au  Fact Sheet for Healthcare Providers:  IncredibleEmployment.be  This test is no t yet approved or cleared by the [place] FDA and  has been authorized for detection and/or diagnosis of SARS-CoV-2 by FDA under an Emergency Use Authorization (EUA). This EUA will remain  in effect (meaning this test can be used) for the duration of the COVID-19 declaration under Section 564(b)(1) of the Act, 21 U.S.C.section 360bbb-3(b)(1), unless the authorization is terminated  or revoked sooner.  Influenza A by PCR NEGATIVE NEGATIVE Final   Influenza B by PCR NEGATIVE NEGATIVE Final    Comment: (NOTE) The Xpert Xpress SARS-CoV-2/FLU/RSV plus assay is intended as an aid in the diagnosis of influenza from Nasopharyngeal swab specimens and should not be used as a sole basis for treatment. Nasal washings and aspirates are unacceptable for Xpert Xpress SARS-CoV-2/FLU/RSV testing.  Fact Sheet for Patients: EntrepreneurPulse.com.au  Fact Sheet for Healthcare Providers: IncredibleEmployment.be  This test is not yet approved or cleared by the [place] FDA and has been authorized for detection and/or diagnosis of SARS-CoV-2 by FDA under an Emergency Use Authorization (EUA). This EUA will remain in effect  (meaning this test can be used) for the duration of the COVID-19 declaration under Section 564(b)(1) of the Act, 21 U.S.C. section 360bbb-3(b)(1), unless the authorization is terminated or revoked.  Performed at [facility] [place] [address] [place], [place] [address]         Radiology Studies: No results found.      Scheduled Meds: . amLODipine  10 mg Oral Daily  . budesonide  9 mg Oral QODAY  . calcium carbonate  1 tablet Oral Daily  . Chlorhexidine Gluconate Cloth  6 each Topical Daily  . cholecalciferol  1,000 Units Oral Daily  . cycloSPORINE  1 drop Both Eyes BID  . DULoxetine  90 mg Oral Daily  . hydrALAZINE  75 mg Oral TID  . insulin aspart  0-9 Units Subcutaneous Q8H  . levETIRAcetam  500 mg Oral BID  . methadone  5 mg Oral 5 X Daily  . metoprolol tartrate  100 mg Oral BID  . pantoprazole  40 mg Oral Daily  . polyvinyl alcohol  1 drop Both Eyes QID  . sucralfate  1 g Oral TID WC & HS  . vitamin B-12  1,000 mcg Oral Daily   Continuous Infusions: . ceFEPime (MAXIPIME) IV 2 g (02/12/21 0957)  . TPN CYCLIC-ADULT (ION)    . vancomycin 1,000 mg (02/11/21 1805)     LOS: 2 days     Cordelia Poche, MD [facility] 02/12/2021, 12:35 PM  If 7PM-7AM, please contact night-coverage [email or web address]

## 2021-02-12 NOTE — Progress Notes (Signed)
During this shift so far, IV Dilaudid 0.5 mg has been bringing pain down from 10/10 to 8/10. Patient requested to be on IV Dilaudid 1 mg q 3 hrs instead of 0.5 mg q 2 hrs. Made [facility] Lovey Newcomer, NP) aware of patient's request. She said that patient's attending would have to talk to patient about this. Patient also complained of some chest pain. EKG done and results showed: Normal Sinus Rhythm with PACs. Re-assessed patient's pain and chest pain has decreased. Made [facility] Lovey Newcomer, NP) aware that patient was having chest pain earlier. No new orders at this time. Will continue to monitor.

## 2021-02-12 NOTE — Plan of Care (Signed)
  Problem: Education: Goal: Knowledge of General Education information will improve Description: Including pain rating scale, medication(s)/side effects and non-pharmacologic comfort measures Outcome: Progressing   Problem: Activity: Goal: Risk for activity intolerance will decrease Outcome: Progressing   Problem: Nutrition: Goal: Adequate nutrition will be maintained Outcome: Progressing   

## 2021-02-12 NOTE — Plan of Care (Signed)

## 2021-02-12 NOTE — Plan of Care (Signed)
  Problem: Education: Goal: Knowledge of General Education information will improve Description Including pain rating scale, medication(s)/side effects and non-pharmacologic comfort measures Outcome: Progressing   

## 2021-02-13 DIAGNOSIS — M4624 Osteomyelitis of vertebra, thoracic region: Secondary | ICD-10-CM | POA: Diagnosis not present

## 2021-02-13 DIAGNOSIS — M4644 Discitis, unspecified, thoracic region: Secondary | ICD-10-CM | POA: Diagnosis not present

## 2021-02-13 DIAGNOSIS — K529 Noninfective gastroenteritis and colitis, unspecified: Secondary | ICD-10-CM

## 2021-02-13 LAB — COMPREHENSIVE METABOLIC PANEL
ALT: 12 U/L (ref 0–44)
AST: 12 U/L — ABNORMAL LOW (ref 15–41)
Albumin: 2.6 g/dL — ABNORMAL LOW (ref 3.5–5.0)
Alkaline Phosphatase: 101 U/L (ref 38–126)
Anion gap: 6 (ref 5–15)
BUN: 40 mg/dL — ABNORMAL HIGH (ref 6–20)
CO2: 26 mmol/L (ref 22–32)
Calcium: 8.9 mg/dL (ref 8.9–10.3)
Chloride: 104 mmol/L (ref 98–111)
Creatinine, Ser: 1.12 mg/dL — ABNORMAL HIGH (ref 0.44–1.00)
GFR, Estimated: 57 mL/min — ABNORMAL LOW (ref >60)
Glucose, Bld: 89 mg/dL (ref 70–99)
Potassium: 3.8 mmol/L (ref 3.5–5.1)
Sodium: 136 mmol/L (ref 135–145)
Total Bilirubin: 0.6 mg/dL (ref 0.3–1.2)
Total Protein: 6.3 g/dL — ABNORMAL LOW (ref 6.5–8.1)

## 2021-02-13 LAB — CBC
HCT: 23.2 % — ABNORMAL LOW (ref 36.0–46.0)
Hemoglobin: 7.5 g/dL — ABNORMAL LOW (ref 12.0–15.0)
MCH: 29.4 pg (ref 26.0–34.0)
MCHC: 32.3 g/dL (ref 30.0–36.0)
MCV: 91 fL (ref 80.0–100.0)
Platelets: 182 10*3/uL (ref 150–400)
RBC: 2.55 MIL/uL — ABNORMAL LOW (ref 3.87–5.11)
RDW: 13.2 % (ref 11.5–15.5)
WBC: 3.8 10*3/uL — ABNORMAL LOW (ref 4.0–10.5)
nRBC: 0 % (ref 0.0–0.2)

## 2021-02-13 LAB — DIFFERENTIAL
Abs Immature Granulocytes: 0.01 10*3/uL (ref 0.00–0.07)
Basophils Absolute: 0 10*3/uL (ref 0.0–0.1)
Basophils Relative: 0 %
Eosinophils Absolute: 0.1 10*3/uL (ref 0.0–0.5)
Eosinophils Relative: 3 %
Immature Granulocytes: 0 %
Lymphocytes Relative: 40 %
Lymphs Abs: 1.5 10*3/uL (ref 0.7–4.0)
Monocytes Absolute: 0.3 10*3/uL (ref 0.1–1.0)
Monocytes Relative: 9 %
Neutro Abs: 1.8 10*3/uL (ref 1.7–7.7)
Neutrophils Relative %: 48 %

## 2021-02-13 LAB — GLUCOSE, CAPILLARY
Glucose-Capillary: 100 mg/dL — ABNORMAL HIGH (ref 70–99)
Glucose-Capillary: 92 mg/dL (ref 70–99)
Glucose-Capillary: 98 mg/dL (ref 70–99)

## 2021-02-13 LAB — PHOSPHORUS: Phosphorus: 3.4 mg/dL (ref 2.5–4.6)

## 2021-02-13 LAB — PREALBUMIN: Prealbumin: 21 mg/dL (ref 18–38)

## 2021-02-13 LAB — TRIGLYCERIDES: Triglycerides: 30 mg/dL (ref <150)

## 2021-02-13 LAB — MAGNESIUM: Magnesium: 2.1 mg/dL (ref 1.7–2.4)

## 2021-02-13 MED ORDER — SODIUM CHLORIDE 0.9 % IV SOLN
600.0000 mg | Freq: Every day | INTRAVENOUS | Status: DC
  Administered 2021-02-13 – 2021-02-15 (×3): 600 mg via INTRAVENOUS
  Filled 2021-02-13 (×3): qty 12

## 2021-02-13 MED ORDER — HYDROCERIN EX CREA
TOPICAL_CREAM | Freq: Two times a day (BID) | CUTANEOUS | Status: DC
  Administered 2021-02-15: 1 via TOPICAL
  Filled 2021-02-13: qty 113

## 2021-02-13 MED ORDER — WHITE PETROLATUM EX OINT
TOPICAL_OINTMENT | Freq: Every day | CUTANEOUS | Status: DC
  Administered 2021-02-14: 1 via TOPICAL
  Filled 2021-02-13 (×2): qty 5

## 2021-02-13 MED ORDER — TRAVASOL 10 % IV SOLN
INTRAVENOUS | Status: AC
  Filled 2021-02-13: qty 1007

## 2021-02-13 NOTE — Care Management Important Message (Signed)
Important Message  Patient Details IM Letter given to the Patient. Name: MEYLIN STENZEL MRN: [id number] Date of Birth: 1961-08-21   Medicare Important Message Given:  Yes     [name] 02/13/2021, 2:24 PM

## 2021-02-13 NOTE — Progress Notes (Signed)
Pharmacy Antibiotic Note  Caitlin Peters is a 60 y.o. female admitted on 02/09/2021 with discitis/osteomyelitis of T2-T5. Pharmacy consulted for vancomycin dosing and cefepime was also started due to Caitlin Peters's history of bacteremia with various gram negative rods including enterobacter cloacae. Notably she most recently grew MRSE in the blood in January 2022.    Noted that Caitlin Peters had neurotoxicity to cefepime in the past but this was likely related to reduced kidney function. She has been on cefepime several times since the reported neurotoxicity with no reported adverse events. SCr- 1.12 today with CrCl ~ 48 ml/min.   Plan: Vancomycin 1000 mg every 24 hours Will obtain trough tomorrow Cefepime 2 gm every 12 hours  Monitor renal function  Height: 5' 1"  (154.9 cm) Weight: 67.5 kg (148 lb 13 oz) IBW/kg (Calculated) : 47.8  Temp (24hrs), Avg:98.1 F (36.7 C), Min:98 F (36.7 C), Max:98.3 F (36.8 C)  Recent Labs  Lab 02/09/21 1928 02/10/21 0317 02/11/21 0406 02/12/21 0345 02/13/21 0500  WBC 5.8 4.9 4.6  --  3.8*  CREATININE 1.24* 1.23* 1.33* 1.19* 1.12*  LATICACIDVEN 1.0  --   --   --   --     Estimated Creatinine Clearance: 47.6 mL/min (A) (by C-G formula based on SCr of 1.12 mg/dL (H)).    Allergies  Allergen Reactions  . Meperidine Hives    Other reaction(s): GI Upset Due to [place]   . Hyoscyamine Hives and Swelling    Legs swelling   Disorientation  . Cefepime Other (See Comments)    Neurotoxicity occurring in setting of AKI. Ceftriaxone tolerated during same admit  . Gabapentin Other (See Comments)    unknown  . Lyrica [Pregabalin] Other (See Comments)    unknown  . Topamax [Topiramate] Other (See Comments)    unknown  . Zosyn [Piperacillin Sod-Tazobactam So]     Patient reports it makes her vomit, her neck stiff, and her "heart feel funny"  . Fentanyl Rash    Pt is allergic to fentanyl patch related to the glue (gives her a rash) Pt states she  is NOT allergic to fentanyl IV medicine  . Morphine And Related Rash    Thank you for allowing pharmacy to be a part of this patient's care.  Laurey Arrow  [facility] PharmD Candidate 2022 Phone: [phone] 02/13/2021 11:50 AM

## 2021-02-13 NOTE — Progress Notes (Signed)
PROGRESS NOTE    Caitlin Peters  PPI:[id number] DOB: 07/05/1961 DOA: 02/09/2021 PCP: [name], [name], MD   Brief Narrative: Caitlin Peters is a 60 y.o. 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 Patient presented secondary to back pain with evidence of discitis/osteoyelitis on imaging.   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 Complicated by PICC line for TPN. No antibiotics started on admission. Discussed with IR and no role for aspiration in this clinical scenario secondary to location of infection and lack of adequate fluid collection. Neurosurgery signed off. Blood cultures obtained on admission and are no growth to date On vancomycin and cefepime per infectious disease recommendations. -Infectious disease recommendations: Vancomycin/Cefepime IV -Follow blood cultures -Dilaudid 0.5 mg IV q2 hours prn  Primary hypertension -Continue amlodipine, hydralazine, metoprolol  Chronic pain Patient follows in a pain clinic. -Continue methadone 5 mg five times daily and Cymbalta 90 mg daily  History of Crohn's disease Follows at [facility] On budesonide as an outpatient -Continue budesonide  CKD stage III Stable. Baseline creatinine of about 1.2-1.3  Chronic anemia Stable.  Short gut syndrome Secondary to surgeries for above. Currently with a right femoral PICC line for TPN. -Continue TPN pending blood culture results  GERD -Continue Protonix  Seizures -Continue Keppra  DVT prophylaxis: SCDs Code Status:   Code Status: Full Code Family Communication: None at bedside Disposition Plan: Discharge home likely in 24 hours pending ID  recommendations  Consultants:   Neurosurgery  Infectious disease  Procedures:   None  Antimicrobials:  None    Subjective: No issues overnight. Reports some hip pain when transitioning from lying to sitting. This pain is not present when lying down or walking.  Objective: Vitals:   02/12/21 1400 02/12/21 2221 02/13/21 0448 02/13/21 1251  BP: (!) 149/70 (!) 147/61 (!) 150/80 (!) 151/67  Pulse: 71 70 69 60  Resp: 14 16 17 15   Temp: 98 F (36.7 C) 98.1 F (36.7 C) 98.3 F (36.8 C) 98.5 F (36.9 C)  TempSrc: Oral   Oral  SpO2: 100% 99% 100% 100%  Weight:      Height:        Intake/Output Summary (Last 24 hours) at 02/13/2021 1317 Last data filed at 02/13/2021 1013 Gross per 24 hour  Intake 2049.79 ml  Output 250 ml  Net 1799.79 ml   Filed Weights   02/10/21 1210 02/10/21 1806  Weight: 67.5 kg 67.5 kg    Examination:  General exam: Appears calm and comfortable and in no acute distress. Conversant Respiratory: Clear to auscultation. Respiratory effort normal with no intercostal retractions or use of accessory muscles Cardiovascular: S1 & S2 heard, RRR. No murmurs, rubs, gallops or clicks. No edema Gastrointestinal: Abdomen is nondistended, soft and nontender. No masses felt. Normal bowel sounds heard Neurologic: No focal neurological deficits Musculoskeletal: No calf tenderness Skin: No cyanosis. No new rashes Psychiatry: Alert and oriented x3. Memory intact. Mood & affect appropriate   Data Reviewed: I have personally reviewed following labs and imaging studies  CBC Lab Results  Component Value Date   WBC 3.8 (L) 02/13/2021   RBC 2.55 (L) 02/13/2021  HGB 7.5 (L) 02/13/2021   HCT 23.2 (L) 02/13/2021   MCV 91.0 02/13/2021   MCH 29.4 02/13/2021   PLT 182 02/13/2021   MCHC 32.3 02/13/2021   RDW 13.2 02/13/2021   LYMPHSABS 1.5 02/13/2021   MONOABS 0.3 02/13/2021   EOSABS 0.1 02/13/2021   BASOSABS 0.0 96/02/5408     Last metabolic panel Lab  Results  Component Value Date   NA 136 02/13/2021   K 3.8 02/13/2021   CL 104 02/13/2021   CO2 26 02/13/2021   BUN 40 (H) 02/13/2021   CREATININE 1.12 (H) 02/13/2021   GLUCOSE 89 02/13/2021   GFRNONAA 57 (L) 02/13/2021   GFRAA 55 (L) 08/12/2020   CALCIUM 8.9 02/13/2021   PHOS 3.4 02/13/2021   PROT 6.3 (L) 02/13/2021   ALBUMIN 2.6 (L) 02/13/2021   BILITOT 0.6 02/13/2021   ALKPHOS 101 02/13/2021   AST 12 (L) 02/13/2021   ALT 12 02/13/2021   ANIONGAP 6 02/13/2021    CBG (last 3)  Recent Labs    02/12/21 1733 02/13/21 0006 02/13/21 0715  GLUCAP 103* 100* 98     GFR: Estimated Creatinine Clearance: 47.6 mL/min (A) (by C-G formula based on SCr of 1.12 mg/dL (H)).  Coagulation Profile: No results for input(s): INR, PROTIME in the last 168 hours.  Recent Results (from the past 240 hour(s))  Blood culture (routine x 2)     Status: None (Preliminary result)   Collection Time: 02/10/21 12:16 AM   Specimen: BLOOD LEFT FOREARM  Result Value Ref Range Status   Specimen Description   Final    BLOOD LEFT FOREARM Performed at [facility] [address], [place], [place] [address]    Special Requests   Final    BOTTLES DRAWN AEROBIC AND ANAEROBIC Blood Culture adequate volume Performed at [facility] [address] [place], [place] [address]    Culture   Final    NO GROWTH 3 DAYS Performed at [facility], [place] [address], [place],  [address]    Report Status PENDING  Incomplete  Resp Panel by RT-PCR (Flu A&B, Covid) Nasopharyngeal Swab     Status: None   Collection Time: 02/10/21 12:16 AM   Specimen: Nasopharyngeal Swab; Nasopharyngeal(NP) swabs in vial transport medium  Result Value Ref Range Status   SARS Coronavirus 2 by RT PCR NEGATIVE NEGATIVE Final    Comment: (NOTE) SARS-CoV-2 target nucleic acids are NOT DETECTED.  The SARS-CoV-2 RNA is generally detectable in upper respiratory specimens during the acute  phase of infection. The lowest concentration of SARS-CoV-2 viral copies this assay can detect is 138 copies/mL. A negative result does not preclude SARS-Cov-2 infection and should not be used as the sole basis for treatment or other patient management decisions. A negative result may occur with  improper specimen collection/handling, submission of specimen other than nasopharyngeal swab, presence of viral mutation(s) within the areas targeted by this assay, and inadequate number of viral copies(<138 copies/mL). A negative result must be combined with clinical observations, patient history, and epidemiological information. The expected result is Negative.  Fact Sheet for Patients:  EntrepreneurPulse.com.au  Fact Sheet for Healthcare Providers:  IncredibleEmployment.be  This test is no t yet approved or cleared by the [place] FDA and  has been authorized for detection and/or diagnosis of SARS-CoV-2 by FDA under an Emergency Use Authorization (EUA). This EUA will remain  in effect (meaning this test can be used) for the duration of the COVID-19  declaration under Section 564(b)(1) of the Act, 21 U.S.C.section 360bbb-3(b)(1), unless the authorization is terminated  or revoked sooner.       Influenza A by PCR NEGATIVE NEGATIVE Final   Influenza B by PCR NEGATIVE NEGATIVE Final    Comment: (NOTE) The Xpert Xpress SARS-CoV-2/FLU/RSV plus assay is intended as an aid in the diagnosis of influenza from Nasopharyngeal swab specimens and should not be used as a sole basis for treatment. Nasal washings and aspirates are unacceptable for Xpert Xpress SARS-CoV-2/FLU/RSV testing.  Fact Sheet for Patients: EntrepreneurPulse.com.au  Fact Sheet for Healthcare Providers: IncredibleEmployment.be  This test is not yet approved or cleared by the [place] FDA and has been authorized for detection and/or diagnosis of  SARS-CoV-2 by FDA under an Emergency Use Authorization (EUA). This EUA will remain in effect (meaning this test can be used) for the duration of the COVID-19 declaration under Section 564(b)(1) of the Act, 21 U.S.C. section 360bbb-3(b)(1), unless the authorization is terminated or revoked.  Performed at [facility], [place] [address] [place], [place] [address]         Radiology Studies: No results found.      Scheduled Meds: . amLODipine  10 mg Oral Daily  . budesonide  9 mg Oral QODAY  . calcium carbonate  1 tablet Oral Daily  . Chlorhexidine Gluconate Cloth  6 each Topical Daily  . cholecalciferol  1,000 Units Oral Daily  . cycloSPORINE  1 drop Both Eyes BID  . DULoxetine  90 mg Oral Daily  . hydrALAZINE  75 mg Oral TID  . insulin aspart  0-9 Units Subcutaneous Q8H  . levETIRAcetam  500 mg Oral BID  . methadone  5 mg Oral 5 X Daily  . metoprolol tartrate  100 mg Oral BID  . pantoprazole  40 mg Oral Daily  . polyvinyl alcohol  1 drop Both Eyes QID  . sucralfate  1 g Oral TID WC & HS  . vitamin B-12  1,000 mcg Oral Daily   Continuous Infusions: . ceFEPime (MAXIPIME) IV 2 g (02/13/21 0926)  . TPN CYCLIC-ADULT (ION)    . vancomycin 1,000 mg (02/12/21 1720)     LOS: 3 days     Cordelia Poche, MD [facility] 02/13/2021, 1:17 PM  If 7PM-7AM, please contact night-coverage [email or web address]

## 2021-02-13 NOTE — Progress Notes (Signed)
PHARMACY - TOTAL PARENTERAL NUTRITION CONSULT NOTE   Indication: Short bowel syndrome  Patient Measurements:  Height: 5' 1"  (154.9 cm) Weight: 67.5 kg (148 lb 13 oz) IBW/kg (Calculated) : 47.8 TPN AdjBW (KG): 52.7 Body mass index is 28.12 kg/m.  Assessment: [name], hx of Short gut syndrome and Crohn's disease on chronic cyclic TPN since 6237. She can tolerate PO, but only very small quantities. Teduglutide started January 2022 for short bowel syndrome. Last dose unknown, reported as within the past week.  She is admitted on 3/18 for worsening back pain with CT concerning for thoracic discitis/osteomyelitis. Pharmacy is consulted to continue TPN.  Glucose / Insulin: Glucose at goal (< 150), 1 unit SSI required in past 24 hours. Electrolytes: wnl on 3/21  Renal: SCr decreased to 1.19 (baseline appears to be ~1.1) Hepatic: WNL, TG 137 (3/18) Prealbumin: 20 (3/18) previously 18.3 (Jan 2022) Intake / Output; MIVF: no IVF, UOP not quantified GI Imaging: 3/17 CT: Diffuse colonic wall thickening consistent with colitis, compatible with patient's known history of Crohn disease. GI Surgeries / Procedures:  Medications:  Budesonide QOD, Tums QD, PPI QD, Sucralfate QID, D3, B12.  Creon PRN (no scheduled doses), loperamide prn, lomotil prn  Central access: femoral CVC TPN start date: continued from home  Nutritional Goals (RD recommendation on 3/18):   Inpatient recommendations Kcal:  1875-2025 kcal, Protein:  95-105 grams, Fluid:  >/= 2 L/day Goal Cyclic TPN is 6283 mL over 12 hr to provides 101 g of protein and 1,924 kcals per day.   Outpatient [facility] TPN was providing:  1800 mL TPN over 12 hours daily.  Lipids on MWF only.  85 g protein daily and 7 day average 1292 kcal daily  Current Nutrition:  Regular diet, TPN  Plan:  At 1517, continue cyclic TPN:  Infuse total volume of 1,900 over 12 hours. Start at 69m/hr x 1 hour, then increase to 172mhr x 10 hours, then decrease back to  8622mr x 1 hour Electrolytes in TPN: Na 85m93m, K 50mE61m Ca 5mEq/21mMg 7mEq/L80mnd Phos 15mmol/86ml:Ac ratio: 1:1 Add standard MVI and trace elements to TPN Stop CBG monitoring Monitor TPN labs on Mon/Thurs.   Caitlin Peters BElenor Peters, BCPS, BCIDP Clinical Pharmacist 02/13/2021 7:47 AM

## 2021-02-13 NOTE — Progress Notes (Signed)
PHARMACY CONSULT NOTE FOR:  OUTPATIENT  PARENTERAL ANTIBIOTIC THERAPY (OPAT)  Indication: Discitis/osteomyelitis Regimen: Daptomycin 600 mg Q 24 hours and Cefepime 2 gm Q 12 hours End date: 03/24/2021  IV antibiotic discharge orders are pended. To discharging provider:  please sign these orders via discharge navigator,  Select New Orders & click on the button choice - Manage This Unsigned Work.     Thank you for allowing pharmacy to be a part of this patient's care.  Laurey Arrow  [facility] PharmD Candidate 2022 02/13/2021, 4:35 PM

## 2021-02-13 NOTE — Progress Notes (Signed)
[facility] for Infectious Disease    Date of Admission:  02/09/2021   Total days of antibiotics 4/vanco and cefepime           ID: Caitlin Peters is a 60 y.o. female with  Principal Problem:   Thoracic discitis Active Problems:   Chronic diarrhea   Crohn's disease of small & large intestine with SGS   Short gut syndrome   Vitamin B12 deficiency   On total parenteral nutrition (TPN)    Subjective: Afebrile. Still having some back pain especially when she sits up, no lower extremity neurological deficits/ or weakness  Medications:  . amLODipine  10 mg Oral Daily  . budesonide  9 mg Oral QODAY  . calcium carbonate  1 tablet Oral Daily  . Chlorhexidine Gluconate Cloth  6 each Topical Daily  . cholecalciferol  1,000 Units Oral Daily  . cycloSPORINE  1 drop Both Eyes BID  . DULoxetine  90 mg Oral Daily  . hydrALAZINE  75 mg Oral TID  . hydrocerin   Topical BID  . insulin aspart  0-9 Units Subcutaneous Q8H  . levETIRAcetam  500 mg Oral BID  . methadone  5 mg Oral 5 X Daily  . metoprolol tartrate  100 mg Oral BID  . pantoprazole  40 mg Oral Daily  . polyvinyl alcohol  1 drop Both Eyes QID  . sucralfate  1 g Oral TID WC & HS  . vitamin B-12  1,000 mcg Oral Daily  . [START ON 02/14/2021] white petrolatum   Topical Daily    Objective: Vital signs in last 24 hours: Temp:  [98.1 F (36.7 C)-98.5 F (36.9 C)] 98.5 F (36.9 C) (03/21 1251) Pulse Rate:  [60-70] 60 (03/21 1251) Resp:  [15-17] 15 (03/21 1251) BP: (147-151)/(61-80) 151/67 (03/21 1251) SpO2:  [99 %-100 %] 100 % (03/21 1251) Physical Exam  Constitutional:  oriented to person, place, and time. appears well-developed and well-nourished. No distress.  HENT: [place]/AT, PERRLA, no scleral icterus Mouth/Throat: Oropharynx is clear and moist. No oropharyngeal exudate.  Cardiovascular: Normal rate, regular rhythm and normal heart sounds. Exam reveals no gallop and no friction rub.  No murmur heard.  Pulmonary/Chest:  Effort normal and breath sounds normal. No respiratory distress.  has no wheezes.  Neck = supple, no nuchal rigidity Abdominal: Soft. Bowel sounds are normal.  exhibits no distension. There is no tenderness.  Groin = right groin central line dressing is c/d/i Lymphadenopathy: no cervical adenopathy. No axillary adenopathy Neurological: alert and oriented to person, place, and time.  Skin: Skin is warm and dry. No rash noted. No erythema.  Psychiatric: a normal mood and affect.  behavior is normal.    Lab Results Recent Labs    02/11/21 0406 02/12/21 0345 02/13/21 0500  WBC 4.6  --  3.8*  HGB 8.1*  --  7.5*  HCT 25.4*  --  23.2*  NA 138 137 136  K 4.2 3.6 3.8  CL 106 106 104  CO2 23 21* 26  BUN 36* 38* 40*  CREATININE 1.33* 1.19* 1.12*   Liver Panel Recent Labs    02/12/21 0345 02/13/21 0500  PROT 6.3* 6.3*  ALBUMIN 2.6* 2.6*  AST 13* 12*  ALT 12 12  ALKPHOS 97 101  BILITOT 0.5 0.6    Microbiology: reviewed Studies/Results: No results found.   Assessment/Plan: Thoracic discitis and ventral epidural abscess = all cultures are negative. Will change regimen to daptomycin and cefepime, plan for 6 wk.  Will need weekly labs and ck checked.  Spent 25 min with coordination of care.  Home health order placed.  Diagnosis: Thoracic discitis  Culture Result: negative  Allergies  Allergen Reactions  . Meperidine Hives    Other reaction(s): GI Upset Due to [place]   . Hyoscyamine Hives and Swelling    Legs swelling   Disorientation  . Cefepime Other (See Comments)    Neurotoxicity occurring in setting of AKI. Ceftriaxone tolerated during same admit  . Gabapentin Other (See Comments)    unknown  . Lyrica [Pregabalin] Other (See Comments)    unknown  . Topamax [Topiramate] Other (See Comments)    unknown  . Zosyn [Piperacillin Sod-Tazobactam So]     Patient reports it makes her vomit, her neck stiff, and her "heart feel funny"  . Fentanyl Rash    Pt  is allergic to fentanyl patch related to the glue (gives her a rash) Pt states she is NOT allergic to fentanyl IV medicine  . Morphine And Related Rash    OPAT Orders Discharge antibiotics to be given via PICC line Discharge antibiotics: Per pharmacy protocol- daptomycin and cefepime Duration: 6 wk End Date: 03/24/21   [facility] Care Per Protocol:  Home health RN for IV administration and teaching; PICC line care and labs.    Labs weekly while on IV antibiotics: _x_ CBC with differential _x_ BMP __ CMP _x_ CRP _x_ ESR  _x_ CK  x__ Please pull PIC at completion of IV antibiotics   Fax weekly labs to [phone]  Clinic Follow Up Appt: [phone]. Will arrange follow up with dr snider     [facility] for Infectious Diseases Cell: [phone] Pager: [phone]  02/13/2021, 5:36 PM

## 2021-02-14 ENCOUNTER — Inpatient Hospital Stay (HOSPITAL_COMMUNITY): Payer: Medicare Other

## 2021-02-14 DIAGNOSIS — R1084 Generalized abdominal pain: Secondary | ICD-10-CM

## 2021-02-14 DIAGNOSIS — M4644 Discitis, unspecified, thoracic region: Secondary | ICD-10-CM | POA: Diagnosis not present

## 2021-02-14 DIAGNOSIS — M4624 Osteomyelitis of vertebra, thoracic region: Secondary | ICD-10-CM | POA: Diagnosis not present

## 2021-02-14 LAB — CBC
HCT: 24.6 % — ABNORMAL LOW (ref 36.0–46.0)
Hemoglobin: 7.9 g/dL — ABNORMAL LOW (ref 12.0–15.0)
MCH: 29.4 pg (ref 26.0–34.0)
MCHC: 32.1 g/dL (ref 30.0–36.0)
MCV: 91.4 fL (ref 80.0–100.0)
Platelets: 220 10*3/uL (ref 150–400)
RBC: 2.69 MIL/uL — ABNORMAL LOW (ref 3.87–5.11)
RDW: 13.2 % (ref 11.5–15.5)
WBC: 4.8 10*3/uL (ref 4.0–10.5)
nRBC: 0 % (ref 0.0–0.2)

## 2021-02-14 LAB — GLUCOSE, CAPILLARY
Glucose-Capillary: 105 mg/dL — ABNORMAL HIGH (ref 70–99)
Glucose-Capillary: 83 mg/dL (ref 70–99)
Glucose-Capillary: 94 mg/dL (ref 70–99)

## 2021-02-14 LAB — CK: Total CK: 32 U/L — ABNORMAL LOW (ref 38–234)

## 2021-02-14 MED ORDER — TRAVASOL 10 % IV SOLN
INTRAVENOUS | Status: AC
  Filled 2021-02-14 (×2): qty 1007

## 2021-02-14 NOTE — Progress Notes (Signed)
Physical Therapy Treatment Patient Details Name: Caitlin Peters MRN: [id number] DOB: 09-23-1961 Today's Date: 02/14/2021    History of Present Illness 60 y.o. female presenting with worsening upper back pain x1 week. 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    PT Comments    Pt has met her mobility goals other than stairs (not attempted due to IV pole) Pt feeling much better and believes "stairs won't be a problem".  Pt hopes to D/C to home tomorrow.   Follow Up Recommendations  No PT follow up     Equipment Recommendations  None recommended by PT    Recommendations for Other Services       Precautions / Restrictions Precautions Precautions: Fall    Mobility  Bed Mobility Overal bed mobility: Modified Independent             General bed mobility comments: self able    Transfers Overall transfer level: Modified independent Equipment used: None             General transfer comment: self able with good safety cognition  Ambulation/Gait Ambulation/Gait assistance: Modified independent (Device/Increase time) Gait Distance (Feet): 650 Feet Assistive device: IV Pole Gait Pattern/deviations: Step-through pattern;Shuffle;Trunk flexed Gait velocity: WFL   General Gait Details: self able with good safety cognition and tolerating great distance.  Pt stated she walks her dog at home several times a day.   Stairs             Wheelchair Mobility    Modified Rankin (Stroke Patients Only)       Balance                                            Cognition Arousal/Alertness: Awake/alert Behavior During Therapy: WFL for tasks assessed/performed Overall Cognitive Status: Within Functional Limits for tasks assessed                                 General Comments: AxO x 3 very pleasant and motivated       Exercises      General Comments        Pertinent Vitals/Pain Pain Assessment: No/denies pain    Home Living                      Prior Function            PT Goals (current goals can now be found in the care plan section) Progress towards PT goals: Progressing toward goals    Frequency    Min 3X/week      PT Plan Current plan remains appropriate    Co-evaluation              AM-PAC PT "6 Clicks" Mobility   Outcome Measure  Help needed turning from your back to your side while in a flat bed without using bedrails?: None Help needed moving from lying on your back to sitting on the side of a flat bed without using bedrails?: None Help needed moving to and from a bed to a chair (including a wheelchair)?: None Help needed standing up from a chair using your arms (e.g., wheelchair or bedside chair)?: None Help needed to walk in hospital room?: None Help  needed climbing 3-5 steps with a railing? : A Little 6 Click Score: 23    End of Session Equipment Utilized During Treatment: Gait belt Activity Tolerance: Patient tolerated treatment well Patient left: in bed;with call bell/phone within reach;with bed alarm set Nurse Communication: Mobility status PT Visit Diagnosis: Difficulty in walking, not elsewhere classified (R26.2)     Time: 1410-1430 PT Time Calculation (min) (ACUTE ONLY): 20 min  Charges:  $Gait Training: 8-22 mins                      Rica Koyanagi  PTA Acute  Rehabilitation Services Pager      [phone] Office      [phone]

## 2021-02-14 NOTE — Plan of Care (Signed)
  Problem: Education: Goal: Knowledge of General Education information will improve Description: Including pain rating scale, medication(s)/side effects and non-pharmacologic comfort measures Outcome: Progressing   Problem: Coping: Goal: Level of anxiety will decrease Outcome: Progressing   Problem: Skin Integrity: Goal: Risk for impaired skin integrity will decrease Outcome: Progressing

## 2021-02-14 NOTE — Progress Notes (Addendum)
PROGRESS NOTE    Caitlin Peters  QBV:[id number] DOB: Sep 25, 1961 DOA: 02/09/2021 PCP: [name], [name], MD   Brief Narrative: Caitlin Peters is a 60 y.o. 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 Patient presented secondary to back pain with evidence of discitis/osteoyelitis on imaging.   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 Complicated by PICC line for TPN. No antibiotics started on admission. Discussed with IR and no role for aspiration in this clinical scenario secondary to location of infection and lack of adequate fluid collection. Neurosurgery signed off. Blood cultures obtained on admission and are no growth to date On vancomycin -> daptomycin and cefepime per infectious disease recommendations. PICC line alredy in place for chronic TPN. -Infectious disease recommendations: Daptomycin IV/Cefepime IV; OPAT orders placed -Dilaudid 0.5 mg IV q2 hours prn  Primary hypertension -Continue amlodipine, hydralazine, metoprolol  Chronic pain Patient follows in a pain clinic. -Continue methadone 5 mg five times daily and Cymbalta 90 mg daily  Abdominal pain Significant per patient. Having bowel movements. Passing gas. Associated nausea -Abdominal x-ray  History of Crohn's disease Follows at [facility]. On budesonide as an outpatient -Continue budesonide  CKD stage III Stable. Baseline creatinine of about 1.2-1.3  Chronic anemia Stable.  Short gut syndrome Secondary to surgeries for above. Currently with a right femoral PICC line for TPN. -Continue TPN  GERD -Continue Protonix  Seizures -Continue Keppra  DVT prophylaxis:  SCDs Code Status:   Code Status: Full Code Family Communication: None at bedside Disposition Plan: Discharge home likely in 24 hours pending ID recommendations and improvement of abdominal pain/nausea  Consultants:   Neurosurgery  Infectious disease  Procedures:   None  Antimicrobials:  Vancomycin  Cefepime  Daptomycin   Subjective: Worsening pain today. Some worsened nausea. Pain is mostly in abdomen.   Objective: Vitals:   02/12/21 1400 02/13/21 0448 02/13/21 1251 02/13/21 2035  BP:  (!) 150/80 (!) 151/67 (!) 148/66  Pulse:  69 60 66  Resp:  17 15 20   Temp:  98.3 F (36.8 C) 98.5 F (36.9 C) 98.9 F (37.2 C)  TempSrc: Oral  Oral Oral  SpO2:  100% 100% 98%  Weight:      Height:        Intake/Output Summary (Last 24 hours) at 02/14/2021 1233 Last data filed at 02/14/2021 1028 Gross per 24 hour  Intake 2294.63 ml  Output 450 ml  Net 1844.63 ml   Filed Weights   02/10/21 1210 02/10/21 1806  Weight: 67.5 kg 67.5 kg    Examination:  General exam: Appears calm and uncomfortable and in no acute distress. Conversant Respiratory: Clear to auscultation. Respiratory effort normal with no intercostal retractions or use of accessory muscles Cardiovascular: S1 & S2 heard, RRR. No murmurs, rubs, gallops or clicks. No edema Gastrointestinal: Abdomen is nondistended, soft and diffusely tender. Decreased bowel sounds heard Neurologic: No focal neurological deficits Musculoskeletal: No calf tenderness Skin: No cyanosis. No new rashes. Dry hands. Psychiatry: Alert and oriented. Memory intact. Mood & affect appropriate   Data Reviewed: I have personally reviewed following labs and imaging studies  CBC Lab Results  Component Value Date  WBC 3.8 (L) 02/13/2021   RBC 2.55 (L) 02/13/2021   HGB 7.5 (L) 02/13/2021   HCT 23.2 (L) 02/13/2021   MCV 91.0 02/13/2021   MCH 29.4 02/13/2021   PLT 182 02/13/2021   MCHC 32.3 02/13/2021   RDW 13.2 02/13/2021   LYMPHSABS 1.5  02/13/2021   MONOABS 0.3 02/13/2021   EOSABS 0.1 02/13/2021   BASOSABS 0.0 40/34/7425     Last metabolic panel Lab Results  Component Value Date   NA 136 02/13/2021   K 3.8 02/13/2021   CL 104 02/13/2021   CO2 26 02/13/2021   BUN 40 (H) 02/13/2021   CREATININE 1.12 (H) 02/13/2021   GLUCOSE 89 02/13/2021   GFRNONAA 57 (L) 02/13/2021   GFRAA 55 (L) 08/12/2020   CALCIUM 8.9 02/13/2021   PHOS 3.4 02/13/2021   PROT 6.3 (L) 02/13/2021   ALBUMIN 2.6 (L) 02/13/2021   BILITOT 0.6 02/13/2021   ALKPHOS 101 02/13/2021   AST 12 (L) 02/13/2021   ALT 12 02/13/2021   ANIONGAP 6 02/13/2021    CBG (last 3)  Recent Labs    02/13/21 1613 02/14/21 0019 02/14/21 0732  GLUCAP 92 105* 94     GFR: Estimated Creatinine Clearance: 47.6 mL/min (A) (by C-G formula based on SCr of 1.12 mg/dL (H)).  Coagulation Profile: No results for input(s): INR, PROTIME in the last 168 hours.  Recent Results (from the past 240 hour(s))  Blood culture (routine x 2)     Status: None (Preliminary result)   Collection Time: 02/10/21 12:16 AM   Specimen: BLOOD LEFT FOREARM  Result Value Ref Range Status   Specimen Description   Final    BLOOD LEFT FOREARM Performed at [facility] [address], [place], [place] [address]    Special Requests   Final    BOTTLES DRAWN AEROBIC AND ANAEROBIC Blood Culture adequate volume Performed at [facility] [address] [place], [place] [address]    Culture   Final    NO GROWTH 4 DAYS Performed at [facility], [place] [address], [place], [place] [address]    Report Status PENDING  Incomplete  Resp Panel by RT-PCR (Flu A&B, Covid) Nasopharyngeal Swab     Status: None   Collection Time: 02/10/21 12:16 AM   Specimen: Nasopharyngeal Swab; Nasopharyngeal(NP) swabs in vial transport medium  Result Value Ref Range Status   SARS Coronavirus 2 by RT PCR NEGATIVE NEGATIVE Final    Comment: (NOTE) SARS-CoV-2 target nucleic  acids are NOT DETECTED.  The SARS-CoV-2 RNA is generally detectable in upper respiratory specimens during the acute phase of infection. The lowest concentration of SARS-CoV-2 viral copies this assay can detect is 138 copies/mL. A negative result does not preclude SARS-Cov-2 infection and should not be used as the sole basis for treatment or other patient management decisions. A negative result may occur with  improper specimen collection/handling, submission of specimen other than nasopharyngeal swab, presence of viral mutation(s) within the areas targeted by this assay, and inadequate number of viral copies(<138 copies/mL). A negative result must be combined with clinical observations, patient history, and epidemiological information. The expected result is Negative.  Fact Sheet for Patients:  EntrepreneurPulse.com.au  Fact Sheet for Healthcare Providers:  IncredibleEmployment.be  This test is no t yet approved or cleared by the [place] FDA and  has been authorized for detection and/or diagnosis of SARS-CoV-2 by FDA under an Emergency Use Authorization (EUA). This EUA will remain  in effect (  meaning this test can be used) for the duration of the COVID-19 declaration under Section 564(b)(1) of the Act, 21 U.S.C.section 360bbb-3(b)(1), unless the authorization is terminated  or revoked sooner.       Influenza A by PCR NEGATIVE NEGATIVE Final   Influenza B by PCR NEGATIVE NEGATIVE Final    Comment: (NOTE) The Xpert Xpress SARS-CoV-2/FLU/RSV plus assay is intended as an aid in the diagnosis of influenza from Nasopharyngeal swab specimens and should not be used as a sole basis for treatment. Nasal washings and aspirates are unacceptable for Xpert Xpress SARS-CoV-2/FLU/RSV testing.  Fact Sheet for Patients: EntrepreneurPulse.com.au  Fact Sheet for Healthcare Providers: IncredibleEmployment.be  This  test is not yet approved or cleared by the [place] FDA and has been authorized for detection and/or diagnosis of SARS-CoV-2 by FDA under an Emergency Use Authorization (EUA). This EUA will remain in effect (meaning this test can be used) for the duration of the COVID-19 declaration under Section 564(b)(1) of the Act, 21 U.S.C. section 360bbb-3(b)(1), unless the authorization is terminated or revoked.  Performed at [facility], [place] [address] [place], [place] [address]         Radiology Studies: No results found.      Scheduled Meds: . amLODipine  10 mg Oral Daily  . budesonide  9 mg Oral QODAY  . calcium carbonate  1 tablet Oral Daily  . Chlorhexidine Gluconate Cloth  6 each Topical Daily  . cholecalciferol  1,000 Units Oral Daily  . cycloSPORINE  1 drop Both Eyes BID  . DULoxetine  90 mg Oral Daily  . hydrALAZINE  75 mg Oral TID  . hydrocerin   Topical BID  . insulin aspart  0-9 Units Subcutaneous Q8H  . levETIRAcetam  500 mg Oral BID  . methadone  5 mg Oral 5 X Daily  . metoprolol tartrate  100 mg Oral BID  . pantoprazole  40 mg Oral Daily  . polyvinyl alcohol  1 drop Both Eyes QID  . sucralfate  1 g Oral TID WC & HS  . vitamin B-12  1,000 mcg Oral Daily  . white petrolatum   Topical Daily   Continuous Infusions: . ceFEPime (MAXIPIME) IV 2 g (02/14/21 0941)  . DAPTOmycin (CUBICIN)  IV 600 mg (02/13/21 1832)  . TPN CYCLIC-ADULT (ION)       LOS: 4 days     Cordelia Poche, MD [facility] 02/14/2021, 12:33 PM  If 7PM-7AM, please contact night-coverage [email or web address]

## 2021-02-14 NOTE — TOC Progression Note (Signed)
Transition of Care [facility]) - Progression Note   Patient Details  Name: Caitlin Peters MRN: [id number] Date of Birth: June 07, 1961  Transition of Care [facility]) CM/SW [place], LCSW Phone Number: 02/14/2021, 12:17 PM  Clinical Narrative: Patient will need to discharge on IV antibiotics. CSW followed up with patient and Pam with [facility] as [facility] is already providing chronic TPN. Patient agreeable to IV antibiotics with [facility]. TOC to follow.  Expected Discharge Plan: [place] Barriers to Discharge: Continued Medical Work up  Expected Discharge Plan and Services Expected Discharge Plan: [place] In-house Referral: Clinical Social Work Living arrangements for the past 2 months: Apartment             DME Arranged: N/A DME Agency: NA  Readmission Risk Interventions Readmission Risk Prevention Plan 02/10/2021 12/21/2020 11/09/2020  Transportation Screening Complete Complete Complete  PCP or Specialist Appt within 3-5 Days - - -  Not Complete comments - - -  HRI or [place] Work Consult for [place] Planning/Counseling - - -  SW consult not completed comments - - -  Palliative Care Screening - - -  Medication Review Press photographer) Complete - Complete  PCP or Specialist appointment within 3-5 days of discharge - Complete Complete  HRI or Home Care Consult Complete Complete Complete  SW Recovery Care/Counseling Consult Complete Complete -  Palliative Care Screening Not Applicable - Not [employer] Not Applicable - Not Applicable

## 2021-02-14 NOTE — Evaluation (Signed)
Occupational Therapy Evaluation Patient Details Name: Caitlin Peters MRN: [id number] DOB: [birth year] Today's Date: 02/14/2021    History of Present Illness 61 y.o. female presenting with worsening upper back pain x1 week. 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   Clinical Impression   PTA patient was living with her daughter and grandchild(ren) in a 2nd floor apartment without elevator access and was grossly I to Mod I with ADLs. Patient does not drive reporting daughter provides transportation. Patient currently functioning near baseline limited by 8/10 in back 2/2 diagnosis above. Upon evaluation patient just returning to supine after completing 3/3 toileting tasks with NT requiring supervision A for safety and line management. Patient demonstrates bed mobility, LB dressing to don footwear and short-distance functional mobility with Mod I to supervision A without AD. Patient does not require continued acute occupational therapy services with OT to sign off at this time. Patient safe to d/c home with intermittent assist from family as needed.     Follow Up Recommendations  No OT follow up    Equipment Recommendations  None recommended by OT    Recommendations for Other Services       Precautions / Restrictions Precautions Precautions: Fall Restrictions Weight Bearing Restrictions: No      Mobility Bed Mobility Overal bed mobility: Modified Independent             General bed mobility comments: Supine <> EOB with HOB flat. Does not require external assist.    Transfers Overall transfer level: Needs assistance Equipment used: None Transfers: Sit to/from Stand Sit to Stand: Supervision         General transfer comment: Patient does not require any external assist. Supervision A for safety.    Balance Overall balance assessment: Needs  assistance Sitting-balance support: No upper extremity supported;Feet supported Sitting balance-Leahy Scale: Good     Standing balance support: No upper extremity supported Standing balance-Leahy Scale: Fair Standing balance comment: Able to ambulate in room without external assist.                           ADL either performed or assessed with clinical judgement   ADL Overall ADL's : At baseline                                       General ADL Comments: Increased time/effort to complete observed ADLs 2/2 back pain. Patient completes LB dressing with Mod I and 3/3 parts of toileting task with supervision A (per NT report just minutes before this OT arrived).     Vision         Perception     Praxis      Pertinent Vitals/Pain Pain Assessment: 0-10 Pain Score: 8  Pain Location: back pain Pain Descriptors / Indicators: Aching;Sore Pain Intervention(s): Limited activity within patient's tolerance;Monitored during session;Premedicated before session;Repositioned     Hand Dominance Right   Extremity/Trunk Assessment Upper Extremity Assessment Upper Extremity Assessment: Overall WFL for tasks assessed   Lower Extremity Assessment Lower Extremity Assessment: Overall WFL for tasks assessed   Cervical / Trunk Assessment Cervical / Trunk Assessment: Other exceptions Cervical / Trunk Exceptions: back pain 2/2 discitis   Communication Communication Communication: No difficulties   Cognition Arousal/Alertness: Awake/alert Behavior During Therapy: WFL for tasks assessed/performed Overall  Cognitive Status: Within Functional Limits for tasks assessed                                     General Comments       Exercises     Shoulder Instructions      Home Living Family/patient expects to be discharged to:: Private residence Living Arrangements: Children;Other relatives Available Help at Discharge: Family;Available 24  hours/day Type of Home: Apartment Home Access: Stairs to enter Entrance Stairs-Number of Steps: flight Entrance Stairs-Rails: Right Home Layout: One level     Bathroom Shower/Tub: Teacher, early years/pre: Standard     Home Equipment: Environmental consultant - 2 wheels;Walker - 4 wheels          Prior Functioning/Environment Level of Independence: Needs assistance  Gait / Transfers Assistance Needed: Intermittent use of RW in home (rarely). Use of rollator in community. ADL's / Homemaking Assistance Needed: Family intermittently provides supervision A for shower transfers. Patient independent otherwise for ADLs/ADL transfers.   Comments: Some information provided at time of OT eval differs from information patient provided at time of PT eval.        OT Problem List: Pain      OT Treatment/Interventions:      OT Goals(Current goals can be found in the care plan section) Acute Rehab OT Goals Patient Stated Goal: To return home. OT Goal Formulation: With patient  OT Frequency:     Barriers to D/C:            Co-evaluation              AM-PAC OT "6 Clicks" Daily Activity     Outcome Measure Help from another person eating meals?: None Help from another person taking care of personal grooming?: A Little Help from another person toileting, which includes using toliet, bedpan, or urinal?: A Little Help from another person bathing (including washing, rinsing, drying)?: A Little Help from another person to put on and taking off regular upper body clothing?: A Little Help from another person to put on and taking off regular lower body clothing?: A Little 6 Click Score: 19   End of Session Equipment Utilized During Treatment: Gait belt  Activity Tolerance: Patient limited by pain Patient left: in bed;with call bell/phone within reach;with bed alarm set  OT Visit Diagnosis: Pain Pain - part of body:  (low back)                Time: 7654-6503 OT Time Calculation (min):  13 min Charges:  OT General Charges $OT Visit: 1 Visit OT Evaluation $OT Eval Low Complexity: 1 Low  Caitlin Peters H. OTR/L Supplemental OT, Department of rehab services [phone]  Caitlin Peters R H. 02/14/2021, 7:46 AM

## 2021-02-14 NOTE — Progress Notes (Signed)
PHARMACY - TOTAL PARENTERAL NUTRITION CONSULT NOTE   Indication: Short bowel syndrome  Patient Measurements:  Height: 5' 1"  (154.9 cm) Weight: 67.5 kg (148 lb 13 oz) IBW/kg (Calculated) : 47.8 TPN AdjBW (KG): 52.7 Body mass index is 28.12 kg/m.  Assessment: 83 yoF, hx of Short gut syndrome and Crohn's disease on chronic cyclic TPN since 7654. She can tolerate PO, but only very small quantities. Teduglutide started January 2022 for short bowel syndrome. Last dose unknown, reported as within the past week.  She is admitted on 3/18 for worsening back pain with CT concerning for thoracic discitis/osteomyelitis. Pharmacy is consulted to continue TPN.  Glucose / Insulin: Glucose at goal (< 150), 1 unit SSI required in past 24 hours. Electrolytes: wnl on 3/21  Renal: SCr decreased to 1.19 (baseline appears to be ~1.1) Hepatic: WNL, TG 137 (3/18) Prealbumin: 20 (3/18) previously 18.3 (Jan 2022) Intake / Output; MIVF: no IVF, UOP not quantified GI Imaging: 3/17 CT: Diffuse colonic wall thickening consistent with colitis, compatible with patient's known history of Crohn disease. GI Surgeries / Procedures:  Medications:  Budesonide QOD, Tums QD, PPI QD, Sucralfate QID, D3, B12.  Creon PRN (no scheduled doses), loperamide prn, lomotil prn  Central access: femoral CVC TPN start date: continued from home  Nutritional Goals (RD recommendation on 3/18):   Inpatient recommendations Kcal:  1875-2025 kcal, Protein:  95-105 grams, Fluid:  >/= 2 L/day Goal Cyclic TPN is 6503 mL over 12 hr to provides 101 g of protein and 1,924 kcals per day.   Outpatient [facility] TPN was providing:  1800 mL TPN over 12 hours daily.  Lipids on MWF only.  85 g protein daily and 7 day average 1292 kcal daily  Current Nutrition:  Regular diet, TPN  Plan:  At 5465, continue cyclic TPN:   Infuse total volume of 1,900 over 12 hours. Start at 16m/hr x 1 hour, then increase to 1711mhr x 10 hours, then decrease back to  8691mr x 1 hour  Electrolytes in TPN: (no changes 3/22)  Na 28m81m  K 50mE19m Ca 5mEq/75mMg 7mEq/L75mhos 15mmol/46ml:Ac ratio: 1:1  Add standard MVI and trace elements to TPN  CBG monitoring stopped d/t good control  Monitor TPN labs on Mon/Thurs   Jamir Rone WofReuel Boom, BCPS [phone], 8:27 AM

## 2021-02-15 DIAGNOSIS — M4644 Discitis, unspecified, thoracic region: Secondary | ICD-10-CM | POA: Diagnosis not present

## 2021-02-15 DIAGNOSIS — M4624 Osteomyelitis of vertebra, thoracic region: Secondary | ICD-10-CM | POA: Diagnosis not present

## 2021-02-15 LAB — GLUCOSE, CAPILLARY
Glucose-Capillary: 102 mg/dL — ABNORMAL HIGH (ref 70–99)
Glucose-Capillary: 118 mg/dL — ABNORMAL HIGH (ref 70–99)

## 2021-02-15 LAB — CULTURE, BLOOD (ROUTINE X 2)
Culture: NO GROWTH
Special Requests: ADEQUATE

## 2021-02-15 MED ORDER — DAPTOMYCIN IV (FOR PTA / DISCHARGE USE ONLY): 600.0000 mg | INTRAVENOUS | 0 refills | Status: AC

## 2021-02-15 MED ORDER — CEFEPIME IV (FOR PTA / DISCHARGE USE ONLY): 2.0000 g | Freq: Two times a day (BID) | INTRAVENOUS | 0 refills | Status: AC

## 2021-02-15 MED ORDER — HEPARIN SOD (PORK) LOCK FLUSH 100 UNIT/ML IV SOLN
250.0000 [IU] | INTRAVENOUS | Status: AC | PRN
  Administered 2021-02-15: 250 [IU]
  Filled 2021-02-15: qty 2.5

## 2021-02-15 NOTE — Progress Notes (Deleted)
PHARMACY - TOTAL PARENTERAL NUTRITION CONSULT NOTE   Indication: Short bowel syndrome  Patient Measurements:  Height: 5' 1"  (154.9 cm) Weight: 67.5 kg (148 lb 13 oz) IBW/kg (Calculated) : 47.8 TPN AdjBW (KG): 52.7 Body mass index is 28.12 kg/m.  Assessment: 24 yoF, hx of Short gut syndrome and Crohn's disease on chronic cyclic TPN since 2182. She can tolerate PO, but only very small quantities. Teduglutide started January 2022 for short bowel syndrome. Last dose unknown, reported as within the past week.  She is admitted on 3/18 for worsening back pain with CT concerning for thoracic discitis/osteomyelitis. Pharmacy is consulted to continue TPN.  Glucose / Insulin: Glucose at goal (< 150); SSI stopped d/t good control Electrolytes: wnl on 3/21  Renal: SCr decreased to 1.19 (baseline appears to be ~1.1) Hepatic: WNL, TG 137 (3/18) Prealbumin: 20 (3/18) previously 18.3 (Jan 2022) Intake / Output; MIVF: no IVF, UOP adequate GI Imaging: 3/17 CT: Diffuse colonic wall thickening consistent with colitis, compatible with patient's known history of Crohn disease. GI Surgeries / Procedures:  Medications:  Budesonide QOD, Tums QD, PPI QD, Sucralfate QID, D3, B12.  Creon PRN (no scheduled doses), loperamide prn, lomotil prn  Central access: femoral CVC TPN start date: continued from home  Nutritional Goals (RD recommendation on 3/18):   Inpatient recommendations Kcal:  1875-2025 kcal, Protein:  95-105 grams, Fluid:  >/= 2 L/day Goal Cyclic TPN is 8833 mL over 12 hr to provides 101 g of protein and 1,924 kcals per day.   Outpatient [facility] TPN was providing:  1800 mL TPN over 12 hours daily.  Lipids on MWF only.  85 g protein daily and 7 day average 1292 kcal daily  Current Nutrition:  Regular diet, TPN  Plan:  At 7445, continue cyclic TPN:   Infuse total volume of 1,900 over 12 hours. Start at 37m/hr x 1 hour, then increase to 1739mhr x 10 hours, then decrease back to 8689mr x 1  hour  Electrolytes in TPN: (no changes 3/23)  Na 83m49m  K 50mE38m Ca 5mEq/55mMg 7mEq/L51mhos 15mmol/30ml:Ac ratio: 1:1  Add standard MVI and trace elements to TPN  CBG monitoring stopped d/t good control  Monitor TPN labs on Mon/Thurs   Drew WofReuel Boom, BCPS [phone], 8:31 AM

## 2021-02-15 NOTE — Discharge Summary (Signed)
Physician Discharge Summary  Caitlin Peters PFX:[id number] DOB: 15-Aug-1961 DOA: 02/09/2021  PCP: [name], [name], MD  Admit date: 02/09/2021 Discharge date: 02/15/2021  Admitted From: Home  Discharge disposition: home   Recommendations for Outpatient Follow-Up:   . Follow up with your primary care provider in one week.  . Check CBC, BMP, magnesium in the next visit . Follow-up with infectious disease as outpatient  Discharge Diagnosis:   Principal Problem:   Thoracic discitis Active Problems:   Chronic diarrhea   Crohn's disease of small & large intestine with SGS   Short gut syndrome   Vitamin B12 deficiency   On total parenteral nutrition (TPN)   Discharge Condition: Improved.  Diet recommendation: Low sodium, heart healthy.   Wound care: None.  Code status: Full.   History of Present Illness:   Caitlin Peters is a 60 y.o. female with a history of Crohn's disease, short gut syndrome s/p PICC on TPN, history of discitis, hypertension presented to hospital with back pain.  MRI of the thoracic spine showed T2-T5 discitis to osteomyelitis.  Neurosurgery and infectious disease was consulted.  Plan was to continue IV antibiotics on discharge.  No surgical intervention was planned.  Hospital Course:   Following conditions were addressed during hospitalization as listed below,  T2-T5 discitis and osteomyelitis Previous history of osteomyelitis.  MRI of the thoracic spine was performed on 02/10/2021 which showed evidence of discitis osteomyelitis from T2-T5 with paravertebral and ventral epidural phlegmon. There was no significant canal stenosis.  Continue  PICC line for TPN.  Has been seen by neurosurgery and infectious disease.  Blood cultures negative.  Patient has been on daptomycin and cefepime as per ID recommendation.  This will be continued on discharge.  Will be managed by home health as outpatient.  Primary hypertension -Continue amlodipine,  hydralazine, metoprolol  Chronic pain Continue methadone and Cymbalta.  Follow-up in pain clinic.  Abdominal pain Chronic.  Patient follows up with pain management.  Abdominal x-ray without any acute findings.  She does have appointment with pain management today at 3 PM  History of Crohn's disease Follows at [facility]. On budesonide as an outpatient  CKD stage III Stable. Baseline creatinine of about 1.2-1.3  Chronic anemia Has remained stable.  Will need outpatient follow-up.  Short gut syndrome Secondary to surgery status post right femoral PICC line for TPN.  Continue TPN on discharge   GERD Continue Protonix  Seizures Continue Keppra  Disposition.  At this time, patient is stable for disposition home with outpatient PCP, infectious disease follow-up.  Medical Consultants:    Neurosurgery  Infectious disease  Procedures:   None  Subjective:   Today, patient examined at bedside.  Complains of back pain  today.  No nausea or vomiting.  Has had a bowel movement.  Discharge Exam:   Vitals:   02/14/21 2106 02/15/21 0511  BP: (!) 155/67 (!) 154/68  Pulse: 68 68  Resp: 18 17  Temp: 98.2 F (36.8 C) 98.8 F (37.1 C)  SpO2: 99% 99%   Vitals:   02/13/21 2035 02/14/21 1314 02/14/21 2106 02/15/21 0511  BP: (!) 148/66 (!) 148/70 (!) 155/67 (!) 154/68  Pulse: 66 62 68 68  Resp: 20 16 18 17   Temp: 98.9 F (37.2 C) 98.1 F (36.7 C) 98.2 F (36.8 C) 98.8 F (37.1 C)  TempSrc: Oral Oral Oral Oral  SpO2: 98% 100% 99% 99%  Weight:      Height:  Body mass index is 28.12 kg/m.  General: Alert awake, not in obvious distress, communicative, alert awake HENT: pupils equally reacting to light,  No scleral pallor or icterus noted. Oral mucosa is moist.  Chest:  Clear breath sounds.  Diminished breath sounds bilaterally. No crackles or wheezes.  CVS: S1 &S2 heard. No murmur.  Regular rate and rhythm. Abdomen: Soft, nontender, nondistended.  Bowel sounds are  heard.  Abdominal wall surgical scar. Extremities: No cyanosis, clubbing or edema.  Peripheral pulses are palpable.  Right groin PICC in place. Psych: Alert, awake and oriented, normal mood CNS:  No cranial nerve deficits.  Power equal in all extremities.   Skin: Warm and dry.  The results of significant diagnostics from this hospitalization (including imaging, microbiology, ancillary and laboratory) are listed below for reference.     Diagnostic Studies:   MR THORACIC SPINE W WO CONTRAST  Result Date: 02/10/2021 CLINICAL DATA:  Mid back pain, abnormal CT EXAM: MRI THORACIC WITHOUT AND WITH CONTRAST TECHNIQUE: Multiplanar and multiecho pulse sequences of the thoracic spine were obtained without and with intravenous contrast. CONTRAST:  50m GADAVIST GADOBUTROL 1 MMOL/ML IV SOLN COMPARISON:  None. FINDINGS: Alignment:  Preserved. Vertebrae: There is abnormal marrow signal and enhancement at T3 and T4 with endplate irregularity prior CT. There is also abnormal signal at the posterior aspect of the T2 vertebral body and along the superior T5 endplate. Cord:  No abnormal signal. Paraspinal and other soft tissues: Paraspinal edema and enhancement centered at the above levels. No evidence of abscess. Disc levels: Mild T2 hyperintensity and enhancement within the T3-T4 and T4-T5 disc spaces. There is ventral epidural enhancement from T2-T3 to T4-T5 without significant canal stenosis. IMPRESSION: Evidence of discitis/osteomyelitis from T2-T5. Paravertebral and ventral epidural phlegmon. No significant canal stenosis. No abscess. Electronically Signed   By: PMacy MisM.D.   On: 02/10/2021 11:46   DG Chest Port 1 View  Result Date: 02/09/2021 CLINICAL DATA:  Chest pain. EXAM: PORTABLE CHEST 1 VIEW COMPARISON:  December 18, 2020. FINDINGS: The heart size and mediastinal contours are within normal limits. Both lungs are clear. No pneumothorax or pleural effusion is noted. The visualized skeletal structures  are unremarkable. IMPRESSION: No active disease. Electronically Signed   By: JMarijo ConceptionM.D.   On: 02/09/2021 19:15   CT Angio Chest/Abd/Pel for Dissection W and/or Wo Contrast  Result Date: 02/09/2021 CLINICAL DATA:  Bilateral shoulder and abdominal pain EXAM: CT ANGIOGRAPHY CHEST, ABDOMEN AND PELVIS TECHNIQUE: Non-contrast CT of the chest was initially obtained. Multidetector CT imaging through the chest, abdomen and pelvis was performed using the standard protocol during bolus administration of intravenous contrast. Multiplanar reconstructed images and MIPs were obtained and reviewed to evaluate the vascular anatomy. CONTRAST:  1028mOMNIPAQUE IOHEXOL 350 MG/ML SOLN COMPARISON:  12/15/2020 FINDINGS: CTA CHEST FINDINGS Cardiovascular: No evidence of thoracic aortic aneurysm or dissection. The heart is unremarkable without pericardial effusion. No evidence of pulmonary embolus. Mediastinum/Nodes: No pathologic adenopathy. Thyroid is heterogeneous but not enlarged. Trachea and esophagus are unremarkable. Lungs/Pleura: No airspace disease, effusion, or pneumothorax. Central airways are widely patent. Musculoskeletal: There is abnormal paraspinal soft tissue swelling extending from T2 through T4. At the T3/T4 disc space there is endplate irregularity with areas of bony resorption along the right anterolateral aspect of the T4 vertebral body, consistent with discitis/osteomyelitis. There are no acute displaced fractures. Reconstructed images demonstrate no additional findings. Review of the MIP images confirms the above findings. CTA ABDOMEN AND PELVIS FINDINGS VASCULAR  Aorta: Normal caliber aorta without aneurysm, dissection, vasculitis or significant stenosis. Mild atherosclerosis. Celiac: Patent without evidence of aneurysm, dissection, vasculitis or significant stenosis. SMA: Patent without evidence of aneurysm, dissection, vasculitis or significant stenosis. Renals: Both renal arteries are patent without  evidence of aneurysm, dissection, vasculitis, fibromuscular dysplasia or significant stenosis. IMA: Patent without evidence of aneurysm, dissection, vasculitis or significant stenosis. Inflow: Patent without evidence of aneurysm, dissection, vasculitis or significant stenosis. Veins: There is a tunneled right femoral PICC line, tip extending to the IVC at the level of the hepatic venous confluence. Review of the MIP images confirms the above findings. NON-VASCULAR Hepatobiliary: Chronic pneumobilia unchanged, consistent with previous biliary duct manipulation. Gallbladder surgically absent. No focal liver abnormalities. Pancreas: Mild pancreatic duct dilation unchanged. The pancreatic parenchyma is unremarkable. Spleen: Normal in size without focal abnormality. Adrenals/Urinary Tract: There is bilateral renal cortical atrophy. No urinary tract calculi or obstructive uropathy. The adrenals and bladder are grossly unremarkable. Stomach/Bowel: Previous postsurgical changes from bowel resections and reanastomosis. No obstruction or ileus. There is diffuse colonic wall thickening, compatible with patient's known history of Crohn disease. Lymphatic: No pathologic adenopathy within the abdomen or pelvis. Reproductive: Status post hysterectomy. No adnexal masses. Other: No free fluid or free gas.  No abdominal wall hernia. Musculoskeletal: No acute or destructive bony lesions. Reconstructed images demonstrate no additional findings. Review of the MIP images confirms the above findings. IMPRESSION: 1. Paraspinal soft tissue swelling and bony irregularities within the upper thoracic spine from T2 through T4, compatible with discitis/osteomyelitis. 2. No evidence of thoracoabdominal aneurysm or dissection. Minimal atherosclerosis is noted. 3. Diffuse colonic wall thickening consistent with colitis, compatible with patient's known history of Crohn disease. Critical Value/emergent results were called by telephone at the time of  interpretation on 02/09/2021 at 10:52 pm to provider DR FLOYD, who verbally acknowledged these results. Electronically Signed   By: Randa Ngo M.D.   On: 02/09/2021 22:56     Labs:   Basic Metabolic Panel: Recent Labs  Lab 02/09/21 1928 02/10/21 0317 02/11/21 0406 02/12/21 0345 02/13/21 0500  NA 140 137 138 137 136  K 3.6 3.5 4.2 3.6 3.8  CL 108 106 106 106 104  CO2 21* 22 23 21* 26  GLUCOSE 84 79 134* 116* 89  BUN 29* 26* 36* 38* 40*  CREATININE 1.24* 1.23* 1.33* 1.19* 1.12*  CALCIUM 9.6 9.2 9.2 9.0 8.9  MG  --  1.9 1.9 1.8 2.1  PHOS  --  3.0 2.5 3.0 3.4   GFR Estimated Creatinine Clearance: 47.6 mL/min (A) (by C-G formula based on SCr of 1.12 mg/dL (H)). Liver Function Tests: Recent Labs  Lab 02/09/21 1928 02/10/21 0317 02/11/21 0406 02/12/21 0345 02/13/21 0500  AST 15 12* 14* 13* 12*  ALT 14 13 12 12 12   ALKPHOS 134* 124 104 97 101  BILITOT 1.0 0.8 0.6 0.5 0.6  PROT 7.5 6.9 6.5 6.3* 6.3*  ALBUMIN 3.3* 2.9* 2.6* 2.6* 2.6*   Recent Labs  Lab 02/09/21 1928  LIPASE 25   No results for input(s): AMMONIA in the last 168 hours. Coagulation profile No results for input(s): INR, PROTIME in the last 168 hours.  CBC: Recent Labs  Lab 02/09/21 1928 02/10/21 0317 02/11/21 0406 02/13/21 0500 02/14/21 1238  WBC 5.8 4.9 4.6 3.8* 4.8  NEUTROABS  --  2.9 2.7 1.8  --   HGB 8.9* 8.4* 8.1* 7.5* 7.9*  HCT 27.7* 25.9* 25.4* 23.2* 24.6*  MCV 90.2 90.6 92.4 91.0 91.4  PLT  235 214 228 182 220   Cardiac Enzymes: Recent Labs  Lab 02/14/21 0336  CKTOTAL 32*   BNP: Invalid input(s): POCBNP CBG: Recent Labs  Lab 02/14/21 0019 02/14/21 0732 02/14/21 1540 02/15/21 0003 02/15/21 0752  GLUCAP 105* 94 83 118* 102*   D-Dimer No results for input(s): DDIMER in the last 72 hours. Hgb A1c No results for input(s): HGBA1C in the last 72 hours. Lipid Profile Recent Labs    02/13/21 0500  TRIG 30   Thyroid function studies No results for input(s): TSH,  T4TOTAL, T3FREE, THYROIDAB in the last 72 hours.  Invalid input(s): FREET3 Anemia work up No results for input(s): VITAMINB12, FOLATE, FERRITIN, TIBC, IRON, RETICCTPCT in the last 72 hours. Microbiology Recent Results (from the past 240 hour(s))  Blood culture (routine x 2)     Status: None   Collection Time: 02/10/21 12:16 AM   Specimen: BLOOD LEFT FOREARM  Result Value Ref Range Status   Specimen Description   Final    BLOOD LEFT FOREARM Performed at [facility] [address], [place], [place] [address]    Special Requests   Final    BOTTLES DRAWN AEROBIC AND ANAEROBIC Blood Culture adequate volume Performed at [facility] [address] [place], [place] [address]    Culture   Final    NO GROWTH 5 DAYS Performed at [facility], [place] [address] [place], [place] [address]    Report Status 02/15/2021 FINAL  Final  Resp Panel by RT-PCR (Flu A&B, Covid) Nasopharyngeal Swab     Status: None   Collection Time: 02/10/21 12:16 AM   Specimen: Nasopharyngeal Swab; Nasopharyngeal(NP) swabs in vial transport medium  Result Value Ref Range Status   SARS Coronavirus 2 by RT PCR NEGATIVE NEGATIVE Final    Comment: (NOTE) SARS-CoV-2 target nucleic acids are NOT DETECTED.  The SARS-CoV-2 RNA is generally detectable in upper respiratory specimens during the acute phase of infection. The lowest concentration of SARS-CoV-2 viral copies this assay can detect is 138 copies/mL. A negative result does not preclude SARS-Cov-2 infection and should not be used as the sole basis for treatment or other patient management decisions. A negative result may occur with  improper specimen collection/handling, submission of specimen other than nasopharyngeal swab, presence of viral mutation(s) within the areas targeted by this assay, and inadequate number of viral copies(<138 copies/mL). A negative result must be combined with clinical observations, patient  history, and epidemiological information. The expected result is Negative.  Fact Sheet for Patients:  EntrepreneurPulse.com.au  Fact Sheet for Healthcare Providers:  IncredibleEmployment.be  This test is no t yet approved or cleared by the [place] FDA and  has been authorized for detection and/or diagnosis of SARS-CoV-2 by FDA under an Emergency Use Authorization (EUA). This EUA will remain  in effect (meaning this test can be used) for the duration of the COVID-19 declaration under Section 564(b)(1) of the Act, 21 U.S.C.section 360bbb-3(b)(1), unless the authorization is terminated  or revoked sooner.       Influenza A by PCR NEGATIVE NEGATIVE Final   Influenza B by PCR NEGATIVE NEGATIVE Final    Comment: (NOTE) The Xpert Xpress SARS-CoV-2/FLU/RSV plus assay is intended as an aid in the diagnosis of influenza from Nasopharyngeal swab specimens and should not be used as a sole basis for treatment. Nasal washings and aspirates are unacceptable for Xpert Xpress SARS-CoV-2/FLU/RSV testing.  Fact Sheet for Patients: EntrepreneurPulse.com.au  Fact Sheet for Healthcare Providers: IncredibleEmployment.be  This test is not yet approved or cleared by the [place] and has been authorized for detection and/or diagnosis of SARS-CoV-2 by FDA under an Emergency Use Authorization (EUA). This EUA will remain in effect (meaning this test can be used) for the duration of the COVID-19 declaration under Section 564(b)(1) of the Act, 21 U.S.C. section 360bbb-3(b)(1), unless the authorization is terminated or revoked.  Performed at [facility], [place] [address] [place], [place] [address]      Discharge Instructions:   Discharge Instructions    [facility] pharmacist to adjust dose for Vancomycin, Aminoglycosides and other anti-infective therapies as requested by physician.    Complete by: As directed    [facility] pharmacist to adjust dose for Vancomycin, Aminoglycosides and other anti-infective therapies as requested by physician.   Complete by: As directed    [facility] to provide Cath Flo 59m   Complete by: As directed    Administer for PICC line occlusion and as ordered by physician for other access device issues.   [facility] to provide Cath Flo 235m  Complete by: As directed    Administer for PICC line occlusion and as ordered by physician for other access device issues.   Anaphylaxis Kit: Provided to treat any anaphylactic reaction to the medication being provided to the patient if First Dose or when requested by physician   Complete by: As directed    Epinephrine 18m48ml vial / amp: Administer 0.26mg75m.26ml)69mbcutaneously once for moderate to severe anaphylaxis, nurse to call physician and pharmacy when reaction occurs and call 911 if needed for immediate care   Diphenhydramine 50mg/70mV vial: Administer 25-50mg I7m PRN for first dose reaction, rash, itching, mild reaction, nurse to call physician and pharmacy when reaction occurs   Sodium Chloride 0.9% NS 500ml IV85mminister if needed for hypovolemic blood pressure drop or as ordered by physician after call to physician with anaphylactic reaction   Anaphylaxis Kit: Provided to treat any anaphylactic reaction to the medication being provided to the patient if First Dose or when requested by physician   Complete by: As directed    Epinephrine 18mg/ml v218m / amp: Administer 0.26mg (0.26m518msubcu7meously once for moderate to severe anaphylaxis, nurse to call physician and pharmacy when reaction occurs and call 911 if needed for immediate care   Diphenhydramine 50mg/ml IV 618m: Administer 25-50mg IV/IM P77mor first dose reaction, rash, itching, mild reaction, nurse to call physician and pharmacy when reaction occurs   Sodium Chloride 0.9% NS 500ml IV: Admi5mer if needed for  hypovolemic blood pressure drop or as ordered by physician after call to physician with anaphylactic reaction   Call MD for:  severe uncontrolled pain   Complete by: As directed    Call MD for:  temperature >100.4   Complete by: As directed    Change dressing on IV access line weekly and PRN   Complete by: As directed    Change dressing on IV access line weekly and PRN   Complete by: As directed    Diet general   Complete by: As directed    Discharge instructions   Complete by: As directed    Continue IV antibiotics as per home health.  Continue TPN at home.  Follow-up with infectious disease as outpatient.  Follow-up with your primary care physician in 1 week.   Flush IV access with Sodium Chloride 0.9% and Heparin 10 units/ml or 100 units/ml   Complete  by: As directed    Flush IV access with Sodium Chloride 0.9% and Heparin 10 units/ml or 100 units/ml   Complete by: As directed    Home infusion instructions - [facility]   Complete by: As directed    Instructions: Flush IV access with Sodium Chloride 0.9% and Heparin 10units/ml or 100units/ml   Change dressing on IV access line: Weekly and PRN   Instructions Cath Flo 68m: Administer for PICC Line occlusion and as ordered by physician for other access device   [facility] pharmacist to adjust dose for: Vancomycin, Aminoglycosides and other anti-infective therapies as requested by physician   Home infusion instructions - [facility]   Complete by: As directed    Instructions: Flush IV access with Sodium Chloride 0.9% and Heparin 10units/ml or 100units/ml   Change dressing on IV access line: Weekly and PRN   Instructions Cath Flo 224m Administer for PICC Line occlusion and as ordered by physician for other access device   [facility] pharmacist to adjust dose for: Vancomycin, Aminoglycosides and other anti-infective therapies as requested by physician   Increase activity slowly   Complete by:  As directed    Method of administration may be changed at the discretion of home infusion pharmacist based upon assessment of the patient and/or caregiver's ability to self-administer the medication ordered   Complete by: As directed    Method of administration may be changed at the discretion of home infusion pharmacist based upon assessment of the patient and/or caregiver's ability to self-administer the medication ordered   Complete by: As directed      Allergies as of 02/15/2021      Reactions   Meperidine Hives   Other reaction(s): GI Upset Due to [place]   Hyoscyamine Hives, Swelling   Legs swelling Disorientation   Cefepime Other (See Comments)   Neurotoxicity occurring in setting of AKI. Ceftriaxone tolerated during same admit   Gabapentin Other (See Comments)   unknown   Lyrica [pregabalin] Other (See Comments)   unknown   Topamax [topiramate] Other (See Comments)   unknown   Zosyn [piperacillin Sod-tazobactam So]    Patient reports it makes her vomit, her neck stiff, and her "heart feel funny"   Fentanyl Rash   Pt is allergic to fentanyl patch related to the glue (gives her a rash) Pt states she is NOT allergic to fentanyl IV medicine   Morphine And Related Rash      Medication List    TAKE these medications   acetaminophen 325 MG tablet Commonly known as: TYLENOL Take 650 mg by mouth every 6 (six) hours as needed for mild pain.   amLODipine 10 MG tablet Commonly known as: NORVASC Take 1 tablet (10 mg total) by mouth daily.   budesonide 3 MG 24 hr capsule Commonly known as: ENTOCORT EC TAKE 3 CAPSULES BY MOUTH ONCE DAILY What changed: when to take this   Calcium 200 MG Tabs Take 200 mg by mouth daily.   ceFEPime  IVPB Commonly known as: MAXIPIME Inject 2 g into the vein every 12 (twelve) hours. Indication:  Discitis/osteomyelitis First Dose: Yes Last Day of Therapy:  03/24/2021 Labs - Once weekly:  CBC/D and BMP, Labs - Every other week:  ESR and  CRP Method of administration: IV Push Method of administration may be changed at the discretion of home infusion pharmacist based upon assessment of the patient and/or caregiver's ability to self-administer the medication ordered.   cholecalciferol 25 MCG (  1000 UNIT) tablet Commonly known as: VITAMIN D3 Take 1,000 Units by mouth daily.   cycloSPORINE 0.05 % ophthalmic emulsion Commonly known as: RESTASIS Place 1 drop into both eyes 2 (two) times daily.   daptomycin  IVPB Commonly known as: CUBICIN Inject 600 mg into the vein daily. Indication: discitis/osteomyelitis First Dose: Yes Last Day of Therapy:  03/24/2021 Labs - Once weekly:  CBC/D, BMP, and CPK Labs - Every other week:  ESR and CRP Method of administration: IV Push Method of administration may be changed at the discretion of home infusion pharmacist based upon assessment of the patient and/or caregiver's ability to self-administer the medication ordered.   dexlansoprazole 60 MG capsule Commonly known as: Dexilant Take 1 capsule (60 mg total) by mouth daily.   Dextran 70-Hypromellose 0.1-0.3 % Soln Place 1 drop into both eyes 4 (four) times daily.   diphenoxylate-atropine 2.5-0.025 MG tablet Commonly known as: LOMOTIL TAKE 1 TABLET BY MOUTH 4 TIMES DAILY AS NEEDED FOR DIARRHEA OR  LOOSE  STOOLS What changed:   how much to take  how to take this  when to take this  reasons to take this  additional instructions   DULoxetine 30 MG capsule Commonly known as: CYMBALTA Take 3 capsules (90 mg total) by mouth daily.   estradiol 2 MG tablet Commonly known as: ESTRACE Take 1 tablet (2 mg total) by mouth daily.   Gattex 5 MG Kit Generic drug: Teduglutide (rDNA) Inject 5 mLs into the skin daily.   hydrALAZINE 50 MG tablet Commonly known as: APRESOLINE Take 1.5 tablets (75 mg total) by mouth 3 (three) times daily.   HYDROmorphone 4 MG tablet Commonly known as: DILAUDID Take 4 mg by mouth 4 (four) times daily  as needed for moderate pain.   levETIRAcetam 500 MG tablet Commonly known as: KEPPRA Take 1 tablet (500 mg total) by mouth 2 (two) times daily.   lipase/protease/amylase 36000 UNITS Cpep capsule Commonly known as: Creon Take 1 capsule (36,000 Units total) by mouth 3 (three) times daily as needed (with meals for digestion).   loperamide 2 MG capsule Commonly known as: IMODIUM TAKE 1 CAPSULE BY MOUTH AS NEEDED FOR DIARRHEA OR LOOSE STOOLS What changed:   how much to take  how to take this  when to take this  reasons to take this  additional instructions   methadone 5 MG tablet Commonly known as: DOLOPHINE Take 1 tablet (5 mg total) by mouth 5 (five) times daily.   metoprolol tartrate 100 MG tablet Commonly known as: LOPRESSOR Take 1 tablet (100 mg total) by mouth 2 (two) times daily.   MULTIVITAMIN ADULT PO Take 1 tablet by mouth daily.   Narcan 4 MG/0.1ML Liqd nasal spray kit Generic drug: naloxone Place 1 spray into the nose once.   PRESCRIPTION MEDICATION Inject 1 each into the vein daily. Home TPN . [facility] . 1 bag for 12 hours. [phone]   PROBIOTIC-10 PO Take 1 capsule by mouth daily.   promethazine 25 MG tablet Commonly known as: PHENERGAN Take 1 tablet (25 mg total) by mouth every 6 (six) hours as needed for nausea or vomiting.   sodium chloride 0.9 % infusion Inject into the vein.   sucralfate 1 GM/10ML suspension Commonly known as: Carafate Take 10 mLs (1 g total) by mouth 4 (four) times daily -  with meals and at bedtime.   TRALEMENT IV Inject 1 mL into the vein. Used in hydration bag 4 times weekly  vitamin B-12 100 MCG tablet Commonly known as: CYANOCOBALAMIN Take 1,000 mcg by mouth daily.            Discharge Care Instructions  (From admission, onward)         Start     Ordered   02/15/21 0000  Change dressing on IV access line weekly and PRN  (Home infusion instructions - [facility] )        02/15/21 0945   02/15/21 0000  Change dressing on IV access line weekly and PRN  (Home infusion instructions - [facility] )        02/15/21 0945          Follow-up Information    [facility] DEPT.   Specialty: Emergency Medicine Why: Return as needed for new or worsening symptoms Contact information: [place] [id number] [place]       [name], [name], MD.   Specialty: Internal Medicine Contact information: [place] [address] [phone]        Carlyle Basques, MD Follow up.   Specialty: Infectious Diseases Why: office to call Contact information: [address] [place] [address] [phone]                Time coordinating discharge: 39 minutes  Signed:  Laxman Pokhrel  [facility] 02/15/2021, 9:46 AM

## 2021-02-15 NOTE — TOC Transition Note (Signed)
Transition of Care [facility]) - CM/SW Discharge Note  Patient Details  Name: Caitlin Peters MRN: [id number] Date of Birth: [birth year]  Transition of Care [facility]) CM/SW Contact:  Sherie Don, LCSW Phone Number: 02/15/2021, 11:00 AM  Clinical Narrative: Patient to discharge today. CSW confirmed with Pam with [facility] that she has the orders she needs for patient's IV antibiotics. CSW updated Georgina Snell with [facility] that [facility] orders have been placed. TOC signing off.  Final next level of care: [place] Barriers to Discharge: Barriers Resolved  Patient Goals and CMS Choice Patient states their goals for this hospitalization and ongoing recovery are:: Discharge home with family CMS Medicare.gov Compare Post Acute Care list provided to:: Patient Choice offered to / list presented to : Patient  Discharge Plan and Services In-house Referral: Clinical Social Work         DME Arranged: N/A DME Agency: NA HH Arranged: RN [place] Agency: [place] Date [facility] Agency Contacted: 02/15/21 Time [place]: 1059 Representative spoke with at [place]: [place]  Readmission Risk Interventions Readmission Risk Prevention Plan 02/10/2021 12/21/2020 11/09/2020  Transportation Screening Complete Complete Complete  PCP or Specialist Appt within 3-5 Days - - -  Not Complete comments - - -  [place] or [place] Work Consult for [place] Planning/Counseling - - -  SW consult not completed comments - - -  Palliative Care Screening - - -  Medication Review Press photographer) Complete - Complete  PCP or Specialist appointment within 3-5 days of discharge - Complete Complete  HRI or Home Care Consult Complete Complete Complete  SW Recovery Care/Counseling Consult Complete Complete -  Palliative Care Screening Not Applicable - Not [place] Not Applicable - Not Applicable

## 2021-02-15 NOTE — Progress Notes (Signed)
PHARMACY - TOTAL PARENTERAL NUTRITION CONSULT NOTE   Indication: Short bowel syndrome  Patient Measurements:  Height: 5' 1"  (154.9 cm) Weight: 67.5 kg (148 lb 13 oz) IBW/kg (Calculated) : 47.8 TPN AdjBW (KG): 52.7 Body mass index is 28.12 kg/m.  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 She can tolerate PO, but only very small quantities. Teduglutide started January 2022 for short bowel syndrome. Last dose unknown, reported as within the past week.  She is admitted on 3/18 for worsening back pain with CT concerning for thoracic discitis/osteomyelitis. Pharmacy is consulted to continue TPN.  Glucose / Insulin: Glucose at goal (< 150); SSI stopped d/t good control Electrolytes: wnl on 3/21  Renal: SCr decreased to 1.19 (baseline appears to be ~1.1) Hepatic: WNL, TG 137 (3/18) Prealbumin: 20 (3/18) previously 18.3 (Jan 2022) Intake / Output; MIVF: no IVF, UOP adequate GI Imaging: 3/17 CT: Diffuse colonic wall thickening consistent with colitis, compatible with patient's known history of Crohn disease. GI Surgeries / Procedures:  Medications:  Budesonide QOD, Tums QD, PPI QD, Sucralfate QID, D3, B12.  Creon PRN (no scheduled doses), loperamide prn, lomotil prn  Central access: femoral CVC TPN start date: continued from home  Nutritional Goals (RD recommendation on 3/18):   Inpatient recommendations Kcal:  1875-2025 kcal, Protein:  95-105 grams, Fluid:  >/= 2 L/day Goal Cyclic TPN is 8333 mL over 12 hr to provides 101 g of protein and 1,924 kcals per day.   Outpatient [facility] TPN was providing:  1800 mL TPN over 12 hours daily.  Lipids on MWF only.  85 g protein daily and 7 day average 1292 kcal daily  Current Nutrition:  Regular diet, TPN  Plan:  At 1800 (if MD unable to discharge) continue cyclic TPN:   Infuse total volume of 1,900 over 12 hours. Start at 33m/hr x 1 hour, then increase to 1767mhr x 10 hours, then  decrease back to 8667mr x 1 hour  Electrolytes in TPN: (no changes 3/23)  Na 29m22m  K 50mE26m Ca 5mEq/50mMg 7mEq/L37mhos 15mmol/23ml:Ac ratio: 1:1  Add standard MVI and trace elements to TPN  CBG monitoring stopped d/t good control  Monitor TPN labs on Mon/Thurs   Makenzye Troutman WofReuel Boom, BCPS [phone], 8:58 AM

## 2021-02-16 ENCOUNTER — Telehealth: Payer: Self-pay | Admitting: Internal Medicine

## 2021-02-16 NOTE — Telephone Encounter (Signed)
Transition Care Management Follow-up Telephone Call  Date of discharge and from where: 02/15/2021 from [facility]   How have you been since you were released from the hospital? Patient states she is still having pain.   Any questions or concerns? No  Items Reviewed:  Did the pt receive and understand the discharge instructions provided? Yes   Medications obtained and verified? Yes   Other? No   Any new allergies since your discharge? No   Dietary orders reviewed? Yes  Do you have support at home? Yes   Home Care and Equipment/Supplies: Were home health services ordered? yes If so, what is the name of the agency? [facility]   Has the agency set up a time to come to the patient's home? yes Were any new equipment or medical supplies ordered?  Yes: IV antibiotics What is the name of the medical supply agency? [facility]  Were you able to get the supplies/equipment? yes Do you have any questions related to the use of the equipment or supplies? No  Functional Questionnaire: (I = Independent and D = Dependent) ADLs: I  Bathing/Dressing- I   Meal Prep- D patients daughter cooks   Eating- I  Maintaining continence- I  Transferring/Ambulation- I with [place]   Managing Meds- I  Follow up appointments reviewed:   PCP Hospital f/u appt confirmed? Yes  Scheduled to see Dr. Jerilee Hoh  on 02/22/2021  @ 11:00 am .  [facility] [facility] f/u appt confirmed? No    Are transportation arrangements needed? No   If their condition worsens, is the pt aware to call PCP or go to the Emergency Dept.? Yes  Was the patient provided with contact information for the PCP's office or ED? Yes  Was to pt encouraged to call back with questions or concerns? Yes

## 2021-02-16 NOTE — Telephone Encounter (Signed)
Transition Care Management Unsuccessful Follow-up Telephone Call  Date of discharge and from where:  02/15/2021 from [facility]   Attempts:  1st Attempt  Reason for unsuccessful TCM follow-up call:  Left voice message

## 2021-02-17 ENCOUNTER — Telehealth: Payer: Self-pay | Admitting: Internal Medicine

## 2021-02-17 ENCOUNTER — Telehealth: Payer: Self-pay | Admitting: *Deleted

## 2021-02-17 NOTE — Telephone Encounter (Signed)
promethazine (PHENERGAN) 25 MG tablet      [place], [place]. Phone:  [phone]  Fax:  [phone]

## 2021-02-17 NOTE — Telephone Encounter (Signed)
Patient will receive her first Prolia injection at her office visit 02/22/21

## 2021-02-20 ENCOUNTER — Other Ambulatory Visit: Payer: Self-pay | Admitting: Internal Medicine

## 2021-02-20 NOTE — Telephone Encounter (Signed)
Patient is calling regarding her medication refill , please advise. CB is [phone]

## 2021-02-21 ENCOUNTER — Ambulatory Visit: Payer: Medicare Other | Admitting: Infectious Disease

## 2021-02-21 ENCOUNTER — Encounter: Payer: Medicare Other | Admitting: Internal Medicine

## 2021-02-21 ENCOUNTER — Other Ambulatory Visit: Payer: Self-pay | Admitting: Internal Medicine

## 2021-02-21 ENCOUNTER — Encounter: Payer: Self-pay | Admitting: Internal Medicine

## 2021-02-21 DIAGNOSIS — G894 Chronic pain syndrome: Secondary | ICD-10-CM

## 2021-02-21 DIAGNOSIS — K912 Postsurgical malabsorption, not elsewhere classified: Secondary | ICD-10-CM

## 2021-02-21 MED ORDER — PROMETHAZINE HCL 25 MG PO TABS
25.0000 mg | ORAL_TABLET | Freq: Four times a day (QID) | ORAL | 0 refills | Status: DC | PRN
Start: 2021-02-21 — End: 2021-03-22

## 2021-02-21 MED ORDER — METOPROLOL TARTRATE 100 MG PO TABS: 100.0000 mg | ORAL_TABLET | Freq: Two times a day (BID) | ORAL | 1 refills | Status: DC

## 2021-02-21 NOTE — Telephone Encounter (Signed)
Sent!

## 2021-02-22 ENCOUNTER — Inpatient Hospital Stay: Payer: Medicare Other | Admitting: Internal Medicine

## 2021-02-23 ENCOUNTER — Ambulatory Visit: Payer: Medicare Other | Admitting: Internal Medicine

## 2021-02-23 ENCOUNTER — Other Ambulatory Visit: Payer: Self-pay | Admitting: Internal Medicine

## 2021-02-23 DIAGNOSIS — Z1231 Encounter for screening mammogram for malignant neoplasm of breast: Secondary | ICD-10-CM

## 2021-02-24 ENCOUNTER — Ambulatory Visit (INDEPENDENT_AMBULATORY_CARE_PROVIDER_SITE_OTHER): Payer: Medicare Other | Admitting: Neurology

## 2021-02-24 ENCOUNTER — Other Ambulatory Visit: Payer: Self-pay

## 2021-02-24 ENCOUNTER — Encounter: Payer: Self-pay | Admitting: Neurology

## 2021-02-24 VITALS — BP 150/73 | HR 88 | Resp 18 | Ht 67.0 in | Wt 142.0 lb

## 2021-02-24 DIAGNOSIS — G40009 Localization-related (focal) (partial) idiopathic epilepsy and epileptic syndromes with seizures of localized onset, not intractable, without status epilepticus: Secondary | ICD-10-CM | POA: Diagnosis not present

## 2021-02-24 MED ORDER — LEVETIRACETAM 500 MG PO TABS: 500.0000 mg | ORAL_TABLET | Freq: Two times a day (BID) | ORAL | 11 refills | Status: DC

## 2021-02-24 NOTE — Progress Notes (Signed)
NEUROLOGY CONSULTATION NOTE  LANISSA CASHEN MRN: [id number] DOB: [birth year]  Referring provider: Dr. [name] Primary care provider: Dr. [name]  Reason for consult:  Seizures  Dear Dr Deniece Ree:  Thank you for your kind referral of KORTNIE STOVALL for consultation of the above symptoms. Although her history is well known to you, please allow me to reiterate it for the purpose of our medical record. She is alone in the office today. Records and images were personally reviewed where available.   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 She is alone in the office today, hospital records were reviewed. Her daughter reported she was sleeping on the couch when she made a low-pitched hum then had upper body/torso shaking, foaming at the mouth for 1-2 minutes. She was tight and stiff after the shaking, unresponsive to verbal stimulation. She was confused after, unable to tell her what state she was in (she moved to [place] a year ago). Her daughter also reported an episode 20 years ago while laying in bed with her mother, she felt full body shaking. Daughter also reported frequent staring spells, around 1/week where she would become unresponsive, stare off into space despite attempts to get her attention. She is sometimes incontinent of urine when she is "confused because of the spell." She had been admitted to the hospital 10 days prior with COVID-19 with mild symptoms. She had an MRI brain without contrast which I personally reviewed, no acute changes, there was mild to moderate white matter changes, likely related to chronic microvascular disease, stable from 2019 imaging. Her routine EEG showed intermittent generalized slowing. She was discharged home on Levetiracetam  552m BID, no side effects. Wellbutrin was discontinued.  She denies any further seizures since 12/15/20. She denies any staring/unresponsive episodes, gaps in time, myoclonic jerks. Rarely she would taste metal, but notes she is having nose bleeds. She feels weaker on her right side. She has had paresthesias in her arms and legs for many years, she has numbness in the heel of her foot and sometimes there is cramping in her toes and her thumb gets stiff. Her hands swell and go numb. She has chronic back pain due to Crohn's disease, and had some neck soreness with distended veins in her neck and chest from ports over the years. She gets around 4-5 hours of sleep, due to pain or need to use the bathroom. Her balance is off, last fall was a year ago. She does not drive, she lives with her daughter and grandchildren. She has a PICC line and is currently administering her own antibiotics in the office, she was in the hospital 2 weeks ago for discitis/osteomyelitis from T2-5, paravertebral and ventral epidural phlegmon.  Epilepsy Risk Factors:  Her mother had seizures later in life. She had a normal birth and early development.  There is no history of febrile convulsions, CNS infections such as meningitis/encephalitis, significant traumatic brain injury, neurosurgical procedures.   Laboratory Data:  Lab Results  Component Value Date   WBC 4.8 02/14/2021   HGB 7.9 (L) 02/14/2021   HCT 24.6 (L) 02/14/2021   MCV 91.4 02/14/2021   PLT 220 02/14/2021     Chemistry      Component Value Date/Time   NA 136 02/13/2021 0500   K  3.8 02/13/2021 0500   CL 104 02/13/2021 0500   CO2 26 02/13/2021 0500   BUN 40 (H) 02/13/2021 0500   CREATININE 1.12 (H) 02/13/2021 0500      Component Value Date/Time   CALCIUM 8.9 02/13/2021 0500   ALKPHOS 101 02/13/2021 0500   AST 12 (L) 02/13/2021 0500   ALT 12 02/13/2021 0500   BILITOT 0.6 02/13/2021 0500      PAST MEDICAL HISTORY: Past Medical History:  Diagnosis Date   . Acute pancreatitis 04/13/2020  . Anasarca 10/2019  . AVN (avascular necrosis of bone) ([place])   . Cataract   . Choledocholithiasis (sludge) s/p ERCP 10/2019 10/21/2020  . Chronic pain syndrome   . CKD (chronic kidney disease), stage III ([place])   . Crohn disease ([place])   . Crohn's disease of small & large intestine with SGS 1984   LEXIANA SPINDEL is a 60year old female with a history of Crohn's disease diagnosed in 76 (age 84), history of long-term steroid use with osteoporosis, S/P multiple bowel resections ([id number]) complicated by chronic back and abdominal pain, steatorrhea and short bowel syndrome. The patient has been left with ~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 attached to proximal transverse colon through rectum. She has been  . Depression   . Diverticulosis   . GERD (gastroesophageal reflux disease)   . HTN (hypertension)   . IDA (iron deficiency anemia)   . Malnutrition ([place])   . Mass in chest   . Osteoporosis   . Osteoporosis 12/24/2014  . Pancreatitis   . 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; 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; jejunal to transverse colon anastomosis Treated at [facility] SB lengthening to 165cm Dr Alene Mires, [facility]  ESTABLISHED AT [facility]  . Vitamin B12 deficiency     PAST SURGICAL HISTORY: Past Surgical History:  Procedure Laterality Date  . ABDOMINAL ADHESION SURGERY  01/22/2018  . APPENDECTOMY  1989  . BILIARY DILATION  11/26/2019   Procedure: BILIARY DILATION;  Surgeon: Jackquline Denmark, MD;  Location: [facility];  Service: Endoscopy;;  . BILIARY DILATION  03/08/2020   Procedure: BILIARY DILATION;  Surgeon: Irving Copas., MD;  Location: [facility];  Service: Gastroenterology;;  . BIOPSY  03/08/2020   Procedure: BIOPSY;  Surgeon: Irving Copas., MD;  Location: [facility];  Service: Gastroenterology;;  . CHEST WALL RESECTION     right thoracotomy,resection of  chest mass with anterior rib and reconstruction using prosthetic mesh and video arthroscopy  . CHOLECYSTECTOMY  01/22/2018  . COLONOSCOPY  2019  . ENTEROSTOMY CLOSURE  04/1999  . ERCP N/A 11/26/2019   Procedure: ENDOSCOPIC RETROGRADE CHOLANGIOPANCREATOGRAPHY (ERCP);  Surgeon: Jackquline Denmark, MD;  Location: [facility];  Service: Endoscopy;  Laterality: N/A;  . ERCP N/A 03/08/2020   Procedure: ENDOSCOPIC RETROGRADE CHOLANGIOPANCREATOGRAPHY (ERCP);  Surgeon: Irving Copas., MD;  Location: [facility];  Service: Gastroenterology;  Laterality: N/A;  . ESOPHAGOGASTRODUODENOSCOPY N/A 03/08/2020   Procedure: ESOPHAGOGASTRODUODENOSCOPY (EGD);  Surgeon: Irving Copas., MD;  Location: [facility];  Service: Gastroenterology;  Laterality: N/A;  . EUS N/A 03/08/2020   Procedure: UPPER ENDOSCOPIC ULTRASOUND (EUS) LINEAR;  Surgeon: Irving Copas., MD;  Location: [facility];  Service: Gastroenterology;  Laterality: N/A;  . ILEOCECETOMY  03/1999   ileocolon resection with abdominal stoma  . ILEOSTOMY CLOSURE  2001  . IR FLUORO GUIDE CV LINE LEFT  01/07/2020  . IR FLUORO GUIDE CV LINE LEFT  03/09/2020  . IR FLUORO GUIDE CV LINE LEFT  05/09/2020  . IR FLUORO GUIDE CV LINE LEFT  07/20/2020  . IR FLUORO GUIDE CV LINE RIGHT  08/05/2020  . IR PTA VENOUS EXCEPT DIALYSIS CIRCUIT  01/07/2020  . IR REMOVAL TUN CV CATH W/O FL  08/05/2020  . IR US GUIDE VASC ACCESS LEFT     x 2 06/17/19 and 09/14/2019  . IR US GUIDE VASC ACCESS RIGHT  08/05/2020  . KNEE SURGERY     right knee   . LAPAROSCOPIC SMALL BOWEL RESECTION  2009   2000-2009.  SB resections for Crohns Disease - now with Short gut  . OMENTECTOMY  01/22/2018  . PARTIAL HYSTERECTOMY  1984   with LSO  . REMOVAL OF STONES  11/26/2019   Procedure: REMOVAL OF STONES;  Surgeon: Jackquline Denmark, MD;  Location: [facility];  Service: Endoscopy;;  . REMOVAL OF STONES  03/08/2020   Procedure: REMOVAL OF STONES;  Surgeon: Irving Copas.,  MD;  Location: [facility];  Service: Gastroenterology;;  . SALPINGOOPHORECTOMY Left 1984  . SALPINGOOPHORECTOMY Right 1990  . SERIAL TRANSVERSE ENTEROPLASTY (STEP) - SMALL BOWEL LENGTHENING  01/22/2018   Dr Alene Mires, [place] length from 120 to 165cm   . SMALL INTESTINE SURGERY  2002  . SMALL INTESTINE SURGERY  2003  . SPHINCTEROTOMY  11/26/2019   Procedure: SPHINCTEROTOMY;  Surgeon: Jackquline Denmark, MD;  Location: [facility];  Service: Endoscopy;;  . TOTAL ABDOMINAL HYSTERECTOMY  1990   with RSO  . UPPER GASTROINTESTINAL ENDOSCOPY      MEDICATIONS: Current Outpatient Medications on File Prior to Visit  Medication Sig Dispense Refill  . acetaminophen (TYLENOL) 325 MG tablet Take 650 mg by mouth every 6 (six) hours as needed for mild pain.     [name] Kitchen amLODipine (NORVASC) 10 MG tablet Take 1 tablet (10 mg total) by mouth daily. 90 tablet 1  . budesonide (ENTOCORT EC) 3 MG 24 hr capsule TAKE 3 CAPSULES BY MOUTH ONCE DAILY (Patient taking differently: Take 9 mg by mouth every other day.) 90 capsule 0  . Calcium 200 MG TABS Take 200 mg by mouth daily.    [name] Kitchen ceFEPIme (MAXIPIME) 2 g injection     . ceFEPime (MAXIPIME) IVPB Inject 2 g into the vein every 12 (twelve) hours. Indication:  Discitis/osteomyelitis First Dose: Yes Last Day of Therapy:  03/24/2021 Labs - Once weekly:  CBC/D and BMP, Labs - Every other week:  ESR and CRP Method of administration: IV Push Method of administration may be changed at the discretion of home infusion pharmacist based upon assessment of the patient and/or caregiver's ability to self-administer the medication ordered. 78 Units 0  . cholecalciferol (VITAMIN D3) 25 MCG (1000 UT) tablet Take 1,000 Units by mouth daily.     . cycloSPORINE (RESTASIS) 0.05 % ophthalmic emulsion Place 1 drop into both eyes 2 (two) times daily.    [name] Kitchen dexlansoprazole (DEXILANT) 60 MG capsule Take 1 capsule (60 mg total) by mouth daily. 90 capsule 1  . Dextran 70-Hypromellose  0.1-0.3 % SOLN Place 1 drop into both eyes 4 (four) times daily.    . diphenoxylate-atropine (LOMOTIL) 2.5-0.025 MG tablet TAKE 1 TABLET BY MOUTH 4 TIMES DAILY AS NEEDED FOR DIARRHEA OR  LOOSE  STOOLS (Patient taking differently: Take 1 tablet by mouth 4 (four) times daily as needed for diarrhea or loose stools.) 90 tablet 1  . DULoxetine (CYMBALTA) 30 MG capsule Take 3 capsules (90 mg total)  by mouth daily. 90 capsule 1  . estradiol (ESTRACE) 2 MG tablet Take 1 tablet (2 mg total) by mouth daily. 90 tablet 1  . GATTEX 5 MG KIT Inject 5 mLs into the skin daily.    . hydrALAZINE (APRESOLINE) 50 MG tablet Take 1.5 tablets (75 mg total) by mouth 3 (three) times daily. 405 tablet 3  . HYDROmorphone (DILAUDID) 4 MG tablet Take 4 mg by mouth 4 (four) times daily as needed for moderate pain.     [name] Kitchen levETIRAcetam (KEPPRA) 500 MG tablet Take 1 tablet (500 mg total) by mouth 2 (two) times daily. 60 tablet 2  . lipase/protease/amylase (CREON) 36000 UNITS CPEP capsule Take 1 capsule (36,000 Units total) by mouth 3 (three) times daily as needed (with meals for digestion). 180 capsule 0  . loperamide (IMODIUM) 2 MG capsule TAKE 1 CAPSULE BY MOUTH AS NEEDED FOR DIARRHEA OR LOOSE STOOLS (Patient taking differently: Take 2 mg by mouth as needed for diarrhea or loose stools.) 60 capsule 2  . methadone (DOLOPHINE) 5 MG tablet Take 1 tablet (5 mg total) by mouth 5 (five) times daily. 30 tablet 0  . metoprolol tartrate (LOPRESSOR) 100 MG tablet Take 1 tablet (100 mg total) by mouth 2 (two) times daily. 90 tablet 1  . Multiple Vitamins-Minerals (MULTIVITAMIN ADULT PO) Take 1 tablet by mouth daily.    [name] Kitchen PRESCRIPTION MEDICATION Inject 1 each into the vein daily. Home TPN . [facility] [place] . 1 bag for 12 hours. [phone]    . Probiotic Product (PROBIOTIC-10 PO) Take 1 capsule by mouth daily.     . promethazine (PHENERGAN) 25 MG tablet Take 1 tablet (25 mg total) by mouth every 6 (six) hours as  needed for nausea or vomiting. 90 tablet 0  . sodium chloride 0.9 % infusion Inject into the vein.    [name] Kitchen sucralfate (CARAFATE) 1 GM/10ML suspension Take 10 mLs (1 g total) by mouth 4 (four) times daily -  with meals and at bedtime. 420 mL 2  . Trace Minerals Cu-Mn-Se-Zn (TRALEMENT IV) Inject 1 mL into the vein. Used in hydration bag 4 times weekly    . vitamin B-12 (CYANOCOBALAMIN) 100 MCG tablet Take 1,000 mcg by mouth daily.     . daptomycin (CUBICIN) IVPB Inject 600 mg into the vein daily. Indication: discitis/osteomyelitis First Dose: Yes Last Day of Therapy:  03/24/2021 Labs - Once weekly:  CBC/D, BMP, and CPK Labs - Every other week:  ESR and CRP Method of administration: IV Push Method of administration may be changed at the discretion of home infusion pharmacist based upon assessment of the patient and/or caregiver's ability to self-administer the medication ordered. (Patient not taking: Reported on 02/24/2021) 39 Units 0  . NARCAN 4 MG/0.1ML LIQD nasal spray kit Place 1 spray into the nose once.  (Patient not taking: Reported on 02/24/2021)     No current facility-administered medications on file prior to visit.    ALLERGIES: Allergies  Allergen Reactions  . Meperidine Hives    Other reaction(s): GI Upset Due to [place]   . Hyoscyamine Hives and Swelling    Legs swelling   Disorientation  . Cefepime Other (See Comments)    Neurotoxicity occurring in setting of AKI. Ceftriaxone tolerated during same admit  . Gabapentin Other (See Comments)    unknown  . Lyrica [Pregabalin] Other (See Comments)    unknown  . Topamax [Topiramate] Other (See Comments)    unknown  . Zosyn [Piperacillin  Sod-Tazobactam So]     Patient reports it makes her vomit, her neck stiff, and her "heart feel funny"  . Fentanyl Rash    Pt is allergic to fentanyl patch related to the glue (gives her a rash) Pt states she is NOT allergic to fentanyl IV medicine  . Morphine And Related Rash    FAMILY  HISTORY: Family History  Problem Relation Age of Onset  . Breast cancer Sister   . Multiple sclerosis Sister   . Diabetes Sister   . Lupus Sister   . Colon cancer Other   . Crohn's disease Other   . Seizures Mother   . Glaucoma Mother   . CAD Father   . Heart disease Father   . Hypertension Father     SOCIAL HISTORY: Social History   Socioeconomic History  . Marital status: Single    Spouse name: Not on file  . Number of children: Not on file  . Years of education: Not on file  . Highest education level: Not on file  Occupational History  . Occupation: disabled  Tobacco Use  . Smoking status: Former Research scientist (life sciences)  . Smokeless tobacco: Never Used  Vaping Use  . Vaping Use: Never used  Substance and Sexual Activity  . Alcohol use: Not Currently  . Drug use: Never  . Sexual activity: Yes  Other Topics Concern  . Not on file  Social History Narrative   Right handed   One story apartment   No caffeine   Social Determinants of Health   Financial Resource Strain: Low Risk   . Difficulty of Paying Living Expenses: Not hard at all  Food Insecurity: Not on file  Transportation Needs: No Transportation Needs  . Lack of Transportation (Medical): No  . Lack of Transportation (Non-Medical): No  Physical Activity: Not on file  Stress: Not on file  Social Connections: Not on file  Intimate Partner Violence: Not on file     PHYSICAL EXAM: Vitals:   02/24/21 0819  BP: (!) 150/73  Pulse: 88  Resp: 18  SpO2: 98%   General: No acute distress Head:  Normocephalic/atraumatic Skin/Extremities: No rash, no edema, femoral PICC line on right Neurological Exam: Mental status: alert and oriented to person, place, and time, no dysarthria or aphasia, Fund of knowledge is appropriate.  Recent and remote memory are intact.  Attention and concentration are normal.  Cranial nerves: CN I: not tested CN II: pupils equal, round and reactive to light, visual fields intact CN III, IV, VI:   full range of motion, no nystagmus, no ptosis CN V: facial sensation intact CN VII: upper and lower face symmetric CN VIII: hearing intact to conversation Bulk & Tone: normal, no fasciculations. Motor: 5/5 throughout with no pronator drift. Sensation: intact to light touch, cold, pin, vibration sense.  No extinction to double simultaneous stimulation Deep Tendon Reflexes: +2 throughout except for +1 bilateral ankle jerks Cerebellar: no incoordination on finger to nose testing Gait: slightly wide-based, no ataxia Tremor: minimal postural tremor on left. No resting or action tremor.   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 MRI brain no acute changes, there was mild to moderate white matter signal change, EEG no epileptiform discharges. 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 She denies any staring/unresponsive episodes, no seizures since 12/15/20 on Levetiracetam 562m BID, refills sent. If seizures recur, we will plan for prolonged EEG to further characterize seizures. She does not drive. Follow-up in 6 months, call for any changes.   Thank you for allowing me to participate in the care of this patient. Please do not hesitate to call for any questions or concerns.   KEllouise Newer M.D.  CC: Dr. ADeniece Ree

## 2021-02-24 NOTE — Patient Instructions (Signed)
Good to meet you! Continue Keppra 572m twice a day. Follow-up in 6 months, call for any changes.   Seizure Precautions: 1. If medication has been prescribed for you to prevent seizures, take it exactly as directed.  Do not stop taking the medicine without talking to your doctor first, even if you have not had a seizure in a long time.   2. Avoid activities in which a seizure would cause danger to yourself or to others.  Don't operate dangerous machinery, swim alone, or climb in high or dangerous places, such as on ladders, roofs, or girders.  Do not drive unless your doctor says you may.  3. If you have any warning that you may have a seizure, lay down in a safe place where you can't hurt yourself.    4.  No driving for 6 months from last seizure, as per [facility]   Please refer to the following link on the EEarlywebsite for more information: [email or web address]   5.  Maintain good sleep hygiene.   6.  Contact your doctor if you have any problems that may be related to the medicine you are taking.  7.  Call 911 and bring the patient back to the ED if:        A.  The seizure lasts longer than 5 minutes.       B.  The patient doesn't awaken shortly after the seizure  C.  The patient has new problems such as difficulty seeing, speaking or moving  D.  The patient was injured during the seizure  E.  The patient has a temperature over 102 F (39C)  F.  The patient vomited and now is having trouble breathing

## 2021-02-28 ENCOUNTER — Ambulatory Visit (INDEPENDENT_AMBULATORY_CARE_PROVIDER_SITE_OTHER): Payer: Medicare Other | Admitting: Internal Medicine

## 2021-02-28 ENCOUNTER — Other Ambulatory Visit: Payer: Self-pay

## 2021-02-28 ENCOUNTER — Encounter: Payer: Self-pay | Admitting: Internal Medicine

## 2021-02-28 VITALS — BP 150/70 | HR 58 | Ht 67.0 in | Wt 140.0 lb

## 2021-02-28 DIAGNOSIS — I10 Essential (primary) hypertension: Secondary | ICD-10-CM

## 2021-02-28 DIAGNOSIS — I491 Atrial premature depolarization: Secondary | ICD-10-CM

## 2021-02-28 NOTE — Progress Notes (Signed)
Cardiology Office Note:    Date:  02/28/2021   ID:  Caitlin Peters, DOB [birth year], MRN [id number]  PCP:  [name], [name], MD  [facility] Cardiologist:  Rudean Haskell MD [place] Electrophysiologist:  None   Referring MD: [name], [place]*   CC: Follow up palpitations  History of Present Illness:    Caitlin Peters is a 60 y.o. female with a hx of HTN, CKD Stage III,  Aortic Atherosclerosis, short gut syndrome and Crohn's since age 87 with prior CLASBSI who presents for evaluation 11/22/20.  In interim of this visit, patient had negative ZioPatch.  Patient notes that she is doing not well.  Since last visit notes that she has new thoracic osteomyelitis.  Still have headache and back pain.    No chest pain or pressure .  No SOB/DOE and no PND/Orthopnea.  No weight gain or leg swelling.  No palpitations or syncope.  Notes that her back pain is slowly improving and headache has only been an issue today (had a month HA free)  Ambulatory blood pressure SBP 145/70 lately.   Past Medical History:  Diagnosis Date  . Acute pancreatitis 04/13/2020  . Anasarca 10/2019  . AVN (avascular necrosis of bone) ([place])   . Cataract   . Choledocholithiasis (sludge) s/p ERCP 10/2019 10/21/2020  . Chronic pain syndrome   . CKD (chronic kidney disease), stage III ([place])   . Crohn disease ([place])   . Crohn's disease of small & large intestine with SGS 1984   Caitlin Peters is a 60year old female with a history of Crohn's disease diagnosed in 80 (age 79), history of long-term steroid use with osteoporosis, S/P multiple bowel resections (0254-2706) complicated by chronic back and abdominal pain, steatorrhea and short bowel syndrome. The patient has been left with ~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 attached to proximal transverse colon through rectum. She has been  . Depression   . Diverticulosis   . GERD (gastroesophageal reflux disease)   . HTN (hypertension)   . IDA (iron  deficiency anemia)   . Malnutrition ([place])   . Mass in chest   . Osteoporosis   . Osteoporosis 12/24/2014  . Pancreatitis   . 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 Vitamin B12 deficiency     Past Surgical History:  Procedure Laterality Date  . ABDOMINAL ADHESION SURGERY  01/22/2018  . APPENDECTOMY  1989  . BILIARY DILATION  11/26/2019   Procedure: BILIARY DILATION;  Surgeon: Jackquline Denmark, MD;  Location: [facility];  Service: Endoscopy;;  . BILIARY DILATION  03/08/2020   Procedure: BILIARY DILATION;  Surgeon: Irving Copas., MD;  Location: [place];  Service: Gastroenterology;;  . BIOPSY  03/08/2020   Procedure: BIOPSY;  Surgeon: Irving Copas., MD;  Location: [place];  Service: Gastroenterology;;  . CHEST WALL RESECTION     right thoracotomy,resection of chest mass with anterior rib and reconstruction using prosthetic mesh and video arthroscopy  . CHOLECYSTECTOMY  01/22/2018  . COLONOSCOPY  2019  . ENTEROSTOMY CLOSURE  04/1999  . ERCP N/A 11/26/2019   Procedure: ENDOSCOPIC RETROGRADE CHOLANGIOPANCREATOGRAPHY (ERCP);  Surgeon: Jackquline Denmark, MD;  Location: [facility];  Service: Endoscopy;  Laterality: N/A;  . ERCP N/A 03/08/2020   Procedure:  ENDOSCOPIC RETROGRADE CHOLANGIOPANCREATOGRAPHY (ERCP);  Surgeon: Irving Copas., MD;  Location: [place];  Service: Gastroenterology;  Laterality: N/A;  . ESOPHAGOGASTRODUODENOSCOPY N/A 03/08/2020   Procedure: ESOPHAGOGASTRODUODENOSCOPY (EGD);  Surgeon: Irving Copas., MD;  Location: [place];  Service: Gastroenterology;  Laterality: N/A;  . EUS N/A 03/08/2020   Procedure: UPPER ENDOSCOPIC ULTRASOUND (EUS) LINEAR;  Surgeon: Irving Copas.,  MD;  Location: [place];  Service: Gastroenterology;  Laterality: N/A;  . ILEOCECETOMY  03/1999   ileocolon resection with abdominal stoma  . ILEOSTOMY CLOSURE  2001  . IR FLUORO GUIDE CV LINE LEFT  01/07/2020  . IR FLUORO GUIDE CV LINE LEFT  03/09/2020  . IR FLUORO GUIDE CV LINE LEFT  05/09/2020  . IR FLUORO GUIDE CV LINE LEFT  07/20/2020  . IR FLUORO GUIDE CV LINE RIGHT  08/05/2020  . IR PTA VENOUS EXCEPT DIALYSIS CIRCUIT  01/07/2020  . IR REMOVAL TUN CV CATH W/O FL  08/05/2020  . IR US GUIDE VASC ACCESS LEFT     x 2 06/17/19 and 09/14/2019  . IR US GUIDE VASC ACCESS RIGHT  08/05/2020  . KNEE SURGERY     right knee   . LAPAROSCOPIC SMALL BOWEL RESECTION  2009   2000-2009.  SB resections for Crohns Disease - now with Short gut  . OMENTECTOMY  01/22/2018  . PARTIAL HYSTERECTOMY  1984   with LSO  . REMOVAL OF STONES  11/26/2019   Procedure: REMOVAL OF STONES;  Surgeon: Jackquline Denmark, MD;  Location: [facility];  Service: Endoscopy;;  . REMOVAL OF STONES  03/08/2020   Procedure: REMOVAL OF STONES;  Surgeon: Irving Copas., MD;  Location: [place];  Service: Gastroenterology;;  . SALPINGOOPHORECTOMY Left 1984  . SALPINGOOPHORECTOMY Right 1990  . SERIAL TRANSVERSE ENTEROPLASTY (STEP) - SMALL BOWEL LENGTHENING  01/22/2018   Dr Alene Mires, [place] length from 120 to 165cm   . SMALL INTESTINE SURGERY  2002  . SMALL INTESTINE SURGERY  2003  . SPHINCTEROTOMY  11/26/2019   Procedure: SPHINCTEROTOMY;  Surgeon: Jackquline Denmark, MD;  Location: [facility];  Service: Endoscopy;;  . TOTAL ABDOMINAL HYSTERECTOMY  1990   with RSO  . UPPER GASTROINTESTINAL ENDOSCOPY      Current Medications: Current Meds  Medication Sig  . acetaminophen (TYLENOL) 325 MG tablet Take 650 mg by mouth every 6 (six) hours as needed for mild pain.   [name] Kitchen amLODipine (NORVASC) 10 MG tablet Take 1 tablet (10 mg total) by mouth daily.  . budesonide (ENTOCORT EC) 3 MG 24 hr capsule TAKE 3 CAPSULES BY  MOUTH ONCE DAILY  . Calcium 200 MG TABS Take 200 mg by mouth daily.  [name] Kitchen ceFEPime (MAXIPIME) IVPB Inject 2 g into the vein every 12 (twelve) hours. Indication:  Discitis/osteomyelitis First Dose: Yes Last Day of Therapy:  03/24/2021 Labs - Once weekly:  CBC/D and BMP, Labs - Every other week:  ESR and CRP Method of administration: IV Push Method of administration may be changed at the discretion of home infusion pharmacist based upon assessment of the patient and/or caregiver's ability to self-administer the medication ordered.  . cholecalciferol (VITAMIN D3) 25 MCG (1000 UT) tablet Take 1,000 Units by mouth daily.   . cycloSPORINE (RESTASIS) 0.05 % ophthalmic emulsion Place 1 drop into both eyes 2 (two) times daily.  . daptomycin (CUBICIN) IVPB Inject 600 mg into the vein daily. Indication: discitis/osteomyelitis First Dose: Yes Last Day of Therapy:  03/24/2021 Labs - Once weekly:  CBC/D, BMP,  and CPK Labs - Every other week:  ESR and CRP Method of administration: IV Push Method of administration may be changed at the discretion of home infusion pharmacist based upon assessment of the patient and/or caregiver's ability to self-administer the medication ordered. (Patient taking differently: Inject 600 mg into the vein daily. Indication: discitis/osteomyelitis First Dose: Yes Last Day of Therapy:  03/24/2021 Labs - Once weekly:  CBC/D, BMP, and CPK Labs - Every other week:  ESR and CRP Method of administration: IV Push Method of administration may be changed at the discretion of home infusion pharmacist based upon assessment of the patient and/or caregiver's ability to self-administer the medication ordered.)  . dexlansoprazole (DEXILANT) 60 MG capsule Take 1 capsule (60 mg total) by mouth daily.  [name] Kitchen Dextran 70-Hypromellose 0.1-0.3 % SOLN Place 1 drop into both eyes 4 (four) times daily.  . diphenoxylate-atropine (LOMOTIL) 2.5-0.025 MG tablet TAKE 1 TABLET BY MOUTH 4 TIMES DAILY AS NEEDED FOR  DIARRHEA OR  LOOSE  STOOLS (Patient taking differently: TAKE 1 TABLET BY MOUTH 4 TIMES DAILY AS NEEDED FOR DIARRHEA OR  LOOSE  STOOLS)  . DULoxetine (CYMBALTA) 30 MG capsule Take 3 capsules (90 mg total) by mouth daily.  [name] Kitchen estradiol (ESTRACE) 2 MG tablet Take 1 tablet (2 mg total) by mouth daily.  [name] Kitchen GATTEX 5 MG KIT Inject 5 mLs into the skin daily.  . hydrALAZINE (APRESOLINE) 50 MG tablet Take 1.5 tablets (75 mg total) by mouth 3 (three) times daily.  [name] Kitchen HYDROmorphone (DILAUDID) 4 MG tablet Take 4 mg by mouth 4 (four) times daily as needed for moderate pain.   [name] Kitchen levETIRAcetam (KEPPRA) 500 MG tablet Take 1 tablet (500 mg total) by mouth 2 (two) times daily.  . lipase/protease/amylase (CREON) 36000 UNITS CPEP capsule Take 1 capsule (36,000 Units total) by mouth 3 (three) times daily as needed (with meals for digestion).  [name] Kitchen loperamide (IMODIUM) 2 MG capsule TAKE 1 CAPSULE BY MOUTH AS NEEDED FOR DIARRHEA OR LOOSE STOOLS  . methadone (DOLOPHINE) 5 MG tablet Take 1 tablet (5 mg total) by mouth 5 (five) times daily.  . metoprolol tartrate (LOPRESSOR) 100 MG tablet Take 1 tablet (100 mg total) by mouth 2 (two) times daily.  . Multiple Vitamins-Minerals (MULTIVITAMIN ADULT PO) Take 1 tablet by mouth daily.  [name] Kitchen NARCAN 4 MG/0.1ML LIQD nasal spray kit Place 1 spray into the nose once.  [name] Kitchen PRESCRIPTION MEDICATION Inject 1 each into the vein daily. Home TPN . [facility] [place] . 1 bag for 12 hours. [phone]  . Probiotic Product (PROBIOTIC-10 PO) Take 1 capsule by mouth daily.   . promethazine (PHENERGAN) 25 MG tablet Take 1 tablet (25 mg total) by mouth every 6 (six) hours as needed for nausea or vomiting.  . sodium chloride 0.9 % infusion Inject into the vein.  [name] Kitchen sucralfate (CARAFATE) 1 GM/10ML suspension Take 10 mLs (1 g total) by mouth 4 (four) times daily -  with meals and at bedtime.  . Trace Minerals Cu-Mn-Se-Zn (TRALEMENT IV) Inject 1 mL into the vein. Used in TPN bag 4 times  weekly  . vitamin B-12 (CYANOCOBALAMIN) 100 MCG tablet Take 1,000 mcg by mouth daily.   . [DISCONTINUED] ceFEPIme (MAXIPIME) 2 g injection      Allergies:   Meperidine, Hyoscyamine, Cefepime, Gabapentin, Lyrica [pregabalin], Topamax [topiramate], Zosyn [piperacillin sod-tazobactam so], Fentanyl, and Morphine and related   Social History   Socioeconomic History  . Marital status: Single    Spouse name:  Not on file  . Number of children: Not on file  . Years of education: Not on file  . Highest education level: Not on file  Occupational History  . Occupation: disabled  Tobacco Use  . Smoking status: Former Research scientist (life sciences)  . Smokeless tobacco: Never Used  Vaping Use  . Vaping Use: Never used  Substance and Sexual Activity  . Alcohol use: Not Currently  . Drug use: Never  . Sexual activity: Yes  Other Topics Concern  . Not on file  Social History Narrative   Right handed   One story apartment   No caffeine   Social Determinants of Health   Financial Resource Strain: Low Risk   . Difficulty of Paying Living Expenses: Not hard at all  Food Insecurity: Not on file  Transportation Needs: No Transportation Needs  . Lack of Transportation (Medical): No  . Lack of Transportation (Non-Medical): No  Physical Activity: Not on file  Stress: Not on file  Social Connections: Not on file     Family History: [name] family history includes Breast cancer in her sister; CAD in her father; Colon cancer in an other family member; Crohn's disease in an other family member; Diabetes in her sister; Glaucoma in her mother; Heart disease in her father; Hypertension in her father; Lupus in her sister; Multiple sclerosis in her sister; Seizures in her mother.  History of coronary artery disease notable for father.  ROS:   Please see the history of present illness.    All other systems reviewed and are negative.  EKGs/Labs/Other Studies Reviewed:    The following studies were reviewed  today:  EKG:   11/11/20: Sinus Bradycardia with 1st Degree HB, rate 58 with rare PAC, borderline LVH  Cardiac Event Monitoring: Date: 12/27/20 Results:  Patient had a minimum heart rate of 49 bpm, maximum heart rate of 120 bpm, and average heart rate of 73 bpm.  Predominant underlying rhythm was sinus rhythm.  Three run of nonsustained ventricular tachycardia occurred lasting 9 beats at longest with a max rate of 163 bpm at fastest.  Isolated PACs were rare (<1.0%).  Isolated PVCs were frequent (12.0%).  No evidence of complete heart block or atrial fibrillation.  Triggered and diary events associated with sinus rhythm, sinus arrhythmia, or PACs.   Frequent, occasionally symptomatic PACs.   Transthoracic Echocardiogram: Date:09/03/20 Results: 1. Left ventricular ejection fraction, by estimation, is 60 to 65%. The  left ventricle has normal function. The left ventricle has no regional  wall motion abnormalities. There is mild concentric left ventricular  hypertrophy. Left ventricular diastolic  parameters were normal.  2. Right ventricular systolic function is normal. The right ventricular  size is normal. There is normal pulmonary artery systolic pressure.  3. Left atrial size was moderately dilated.  4. The mitral valve is normal in structure. No evidence of mitral valve  regurgitation. No evidence of mitral stenosis.  5. The aortic valve is normal in structure. Aortic valve regurgitation is  not visualized. No aortic stenosis is present.  6. The inferior vena cava is normal in size with greater than 50%  respiratory variability, suggesting right atrial pressure of 3 mmHg.   Recent Labs: 12/15/2020: TSH 0.362 02/13/2021: ALT 12; BUN 40; Creatinine, Ser 1.12; Magnesium 2.1; Potassium 3.8; Sodium 136 02/14/2021: Hemoglobin 7.9; Platelets 220  Recent Lipid Panel    Component Value Date/Time   TRIG 30 02/13/2021 0500     Risk Assessment/Calculations:      N/A  Physical  Exam:    VS:  BP (!) 150/70   Pulse (!) 58   Ht 5' 7"  (1.702 m)   Wt 140 lb (63.5 kg)   SpO2 100%   BMI 21.93 kg/m     Wt Readings from Last 3 Encounters:  02/28/21 140 lb (63.5 kg)  02/24/21 142 lb (64.4 kg)  02/21/21 135 lb (61.2 kg)    GEN:  Well nourished, well developed in no acute distress HEENT: Normal NECK: No JVD; No carotid bruits LYMPHATICS: No lymphadenopathy CARDIAC: RRR, no murmurs, rubs, gallops RESPIRATORY:  Clear to auscultation without rales, wheezing or rhonchi  ABDOMEN: Soft, non-tender, non-distended MUSCULOSKELETAL:  No edema; No deformity  SKIN: Warm and dry NEUROLOGIC:  Alert and oriented x 3 PSYCHIATRIC:  Normal affect   ASSESSMENT:    1. HTN (hypertension), benign   2. PAC (premature atrial contraction)    PLAN:    In order of problems listed above:  Essential Hypertension PACs - ambulatory blood pressure 145/70, will continue ambulatory BP monitoring; gave education on how to perform ambulatory blood pressure monitoring including the frequency and technique; goal ambulatory blood pressure < 135/85 on average - continue home medications - when pain free patient will call in about BP and will increase her hydralazine or transition metoprolol to labetalol - discussed diet (DASH/low sodium), and exercise/weight loss interventions  Fall follow up unless new symptoms or abnormal test results warranting change in plan  Would be reasonable for  Video Visit Follow up Would be reasonable for  APP Follow up   Medication Adjustments/Labs and Tests Ordered: Current medicines are reviewed at length with the patient today.  Concerns regarding medicines are outlined above.  No orders of the defined types were placed in this encounter.  No orders of the defined types were placed in this encounter.   Patient Instructions  Medication Instructions:  Your physician recommends that you continue on your current medications as directed.  Please refer to the Current Medication list given to you today.  *If you need a refill on your cardiac medications before your next appointment, please call your pharmacy*   Lab Work: NONE If you have labs (blood work) drawn today and your tests are completely normal, you will receive your results only by: [name] Kitchen MyChart Message (if you have MyChart) OR . A paper copy in the mail If you have any lab test that is abnormal or we need to change your treatment, we will call you to review the results.   Testing/Procedures: NONE   Follow-Up: At [facility], you and your health needs are our priority.  As part of our continuing mission to provide you with exceptional heart care, we have created designated Provider Care Teams.  These Care Teams include your primary Cardiologist (physician) and Advanced Practice Providers (APPs -  Physician Assistants and Nurse Practitioners) who all work together to provide you with the care you need, when you need it.  We recommend signing up for the patient portal called "MyChart".  Sign up information is provided on this After Visit Summary.  MyChart is used to connect with patients for Virtual Visits (Telemedicine).  Patients are able to view lab/test results, encounter notes, upcoming appointments, etc.  Non-urgent messages can be sent to your provider as well.   To learn more about what you can do with MyChart, go to [email or web address].    Your next appointment:   6-7 month(s)  The format for your next appointment:   In Person  Provider:  You may see Rudean Haskell, MD or one of the following Advanced Practice Providers on your designated Care Team:    Melina Copa, PA-C  Ermalinda Barrios, PA-C          Signed, Werner Lean, MD  02/28/2021 5:52 PM    [place]

## 2021-02-28 NOTE — Patient Instructions (Signed)
Medication Instructions:  Your physician recommends that you continue on your current medications as directed. Please refer to the Current Medication list given to you today.  *If you need a refill on your cardiac medications before your next appointment, please call your pharmacy*   Lab Work: NONE If you have labs (blood work) drawn today and your tests are completely normal, you will receive your results only by: [name] Kitchen MyChart Message (if you have MyChart) OR . A paper copy in the mail If you have any lab test that is abnormal or we need to change your treatment, we will call you to review the results.   Testing/Procedures: NONE   Follow-Up: At [facility], you and your health needs are our priority.  As part of our continuing mission to provide you with exceptional heart care, we have created designated Provider Care Teams.  These Care Teams include your primary Cardiologist (physician) and Advanced Practice Providers (APPs -  Physician Assistants and Nurse Practitioners) who all work together to provide you with the care you need, when you need it.  We recommend signing up for the patient portal called "MyChart".  Sign up information is provided on this After Visit Summary.  MyChart is used to connect with patients for Virtual Visits (Telemedicine).  Patients are able to view lab/test results, encounter notes, upcoming appointments, etc.  Non-urgent messages can be sent to your provider as well.   To learn more about what you can do with MyChart, go to [email or web address].    Your next appointment:   6-7 month(s)  The format for your next appointment:   In Person  Provider:   You may see Rudean Haskell, MD or one of the following Advanced Practice Providers on your designated Care Team:    Melina Copa, PA-C  Ermalinda Barrios, PA-C

## 2021-03-01 ENCOUNTER — Other Ambulatory Visit: Payer: Self-pay

## 2021-03-02 ENCOUNTER — Encounter: Payer: Self-pay | Admitting: Internal Medicine

## 2021-03-02 ENCOUNTER — Ambulatory Visit (INDEPENDENT_AMBULATORY_CARE_PROVIDER_SITE_OTHER): Payer: Medicare Other | Admitting: Internal Medicine

## 2021-03-02 VITALS — BP 120/70 | HR 74 | Temp 98.3°F | Wt 137.8 lb

## 2021-03-02 DIAGNOSIS — Z09 Encounter for follow-up examination after completed treatment for conditions other than malignant neoplasm: Secondary | ICD-10-CM

## 2021-03-02 DIAGNOSIS — M4644 Discitis, unspecified, thoracic region: Secondary | ICD-10-CM | POA: Diagnosis not present

## 2021-03-02 DIAGNOSIS — M816 Localized osteoporosis [Lequesne]: Secondary | ICD-10-CM | POA: Diagnosis not present

## 2021-03-02 DIAGNOSIS — R569 Unspecified convulsions: Secondary | ICD-10-CM | POA: Diagnosis not present

## 2021-03-02 MED ORDER — DENOSUMAB 60 MG/ML ~~LOC~~ SOSY
60.0000 mg | PREFILLED_SYRINGE | Freq: Once | SUBCUTANEOUS | Status: AC
  Administered 2021-03-02: 60 mg via SUBCUTANEOUS

## 2021-03-02 NOTE — Progress Notes (Signed)
Established Patient Office Visit     This visit occurred during the SARS-CoV-2 public health emergency.  Safety protocols were in place, including screening questions prior to the visit, additional usage of staff PPE, and extensive cleaning of exam room while observing appropriate contact time as indicated for disinfecting solutions.    CC/Reason for Visit: Hospital follow-up  HPI: Caitlin Peters is a 60 y.o. female who is coming in today for the above mentioned reasons.  Caitlin Peters was hospitalized from March 17 through March 23 for thoracic discitis after she presented to the ED with a 1 week history of worsening back pain.  MRI showed T2/T5 discitis.  She had consultations by neurosurgery and infectious diseases.  Plan was to continue antibiotics until April 29.  She is on daptomycin and cefepime.  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  She is on TPN.  Blood cultures were negative this hospitalization.  She tells me that today her home health company drew labs that were requested at time of discharge.  She will have home health company forward these labs to be.  Since her hospital discharge she has seen the neurologist whose recommendation was to continue [place] for seizure disorder.  Her follow-up with infectious diseases is scheduled for May 5.  Past Medical/Surgical History: Past Medical History:  Diagnosis Date  . Acute pancreatitis 04/13/2020  . Anasarca 10/2019  . AVN (avascular necrosis of bone) ([place])   . Cataract   . Choledocholithiasis (sludge) s/p ERCP 10/2019 10/21/2020  . Chronic pain syndrome   . CKD (chronic kidney disease), stage III ([place])   . Crohn disease ([place])   . Crohn's disease of small & large intestine with SGS 1984   LERAE Caitlin Peters is a 60year old female with a history of Crohn's disease diagnosed in [birth year] (age 56),  history of long-term steroid use with osteoporosis, S/P multiple bowel resections ([id number]) complicated by chronic back and abdominal pain, steatorrhea and short bowel syndrome. The patient has been left with ~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 attached to proximal transverse colon through rectum. She has been  . Depression   . Diverticulosis   . GERD (gastroesophageal reflux disease)   . HTN (hypertension)   . IDA (iron deficiency anemia)   . Malnutrition ([place])   . Mass in chest   . Osteoporosis   . Osteoporosis 12/24/2014  . Pancreatitis   . 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; 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; jejunal to transverse colon anastomosis Treated at [facility] SB lengthening to 165cm Dr Alene Mires, [facility]  ESTABLISHED AT [facility]  . Vitamin B12 deficiency     Past Surgical History:  Procedure Laterality Date  . ABDOMINAL ADHESION SURGERY  01/22/2018  . APPENDECTOMY  1989  . BILIARY DILATION  11/26/2019   Procedure: BILIARY DILATION;  Surgeon: Jackquline Denmark, MD;  Location: [facility];  Service: Endoscopy;;  . BILIARY DILATION  03/08/2020   Procedure: BILIARY DILATION;  Surgeon: Irving Copas., MD;  Location: [place];  Service: Gastroenterology;;  . BIOPSY  03/08/2020   Procedure: BIOPSY;  Surgeon: Irving Copas., MD;  Location: [place];  Service: Gastroenterology;;  . CHEST WALL RESECTION     right thoracotomy,resection of chest mass with anterior rib and reconstruction using  prosthetic mesh and video arthroscopy  . CHOLECYSTECTOMY  01/22/2018  . COLONOSCOPY  2019  . ENTEROSTOMY CLOSURE  04/1999  . ERCP N/A 11/26/2019   Procedure: ENDOSCOPIC RETROGRADE CHOLANGIOPANCREATOGRAPHY (ERCP);  Surgeon: Jackquline Denmark, MD;  Location: [facility];  Service: Endoscopy;  Laterality: N/A;  . ERCP N/A 03/08/2020   Procedure: ENDOSCOPIC RETROGRADE CHOLANGIOPANCREATOGRAPHY (ERCP);  Surgeon:  Irving Copas., MD;  Location: [place];  Service: Gastroenterology;  Laterality: N/A;  . ESOPHAGOGASTRODUODENOSCOPY N/A 03/08/2020   Procedure: ESOPHAGOGASTRODUODENOSCOPY (EGD);  Surgeon: Irving Copas., MD;  Location: [place];  Service: Gastroenterology;  Laterality: N/A;  . EUS N/A 03/08/2020   Procedure: UPPER ENDOSCOPIC ULTRASOUND (EUS) LINEAR;  Surgeon: Irving Copas., MD;  Location: [place];  Service: Gastroenterology;  Laterality: N/A;  . ILEOCECETOMY  03/1999   ileocolon resection with abdominal stoma  . ILEOSTOMY CLOSURE  2001  . IR FLUORO GUIDE CV LINE LEFT  01/07/2020  . IR FLUORO GUIDE CV LINE LEFT  03/09/2020  . IR FLUORO GUIDE CV LINE LEFT  05/09/2020  . IR FLUORO GUIDE CV LINE LEFT  07/20/2020  . IR FLUORO GUIDE CV LINE RIGHT  08/05/2020  . IR PTA VENOUS EXCEPT DIALYSIS CIRCUIT  01/07/2020  . IR REMOVAL TUN CV CATH W/O FL  08/05/2020  . IR US GUIDE VASC ACCESS LEFT     x 2 06/17/19 and 09/14/2019  . IR US GUIDE VASC ACCESS RIGHT  08/05/2020  . KNEE SURGERY     right knee   . LAPAROSCOPIC SMALL BOWEL RESECTION  2009   2000-2009.  SB resections for Crohns Disease - now with Short gut  . OMENTECTOMY  01/22/2018  . PARTIAL HYSTERECTOMY  1984   with LSO  . REMOVAL OF STONES  11/26/2019   Procedure: REMOVAL OF STONES;  Surgeon: Jackquline Denmark, MD;  Location: [facility];  Service: Endoscopy;;  . REMOVAL OF STONES  03/08/2020   Procedure: REMOVAL OF STONES;  Surgeon: Irving Copas., MD;  Location: [place];  Service: Gastroenterology;;  . SALPINGOOPHORECTOMY Left 1984  . SALPINGOOPHORECTOMY Right 1990  . SERIAL TRANSVERSE ENTEROPLASTY (STEP) - SMALL BOWEL LENGTHENING  01/22/2018   Dr Alene Mires, [place] length from 120 to 165cm   . SMALL INTESTINE SURGERY  2002  . SMALL INTESTINE SURGERY  2003  . SPHINCTEROTOMY  11/26/2019   Procedure: SPHINCTEROTOMY;  Surgeon: Jackquline Denmark, MD;  Location: [facility];  Service:  Endoscopy;;  . TOTAL ABDOMINAL HYSTERECTOMY  1990   with RSO  . UPPER GASTROINTESTINAL ENDOSCOPY      Social History:  reports that she has quit smoking. She has never used smokeless tobacco. She reports previous alcohol use. She reports that she does not use drugs.  Allergies: Allergies  Allergen Reactions  . Meperidine Hives    Other reaction(s): GI Upset Due to [place]   . Hyoscyamine Hives and Swelling    Legs swelling   Disorientation  . Cefepime Other (See Comments)    Neurotoxicity occurring in setting of AKI. Ceftriaxone tolerated during same admit  . Gabapentin Other (See Comments)    unknown  . Lyrica [Pregabalin] Other (See Comments)    unknown  . Topamax [Topiramate] Other (See Comments)    unknown  . Zosyn [Piperacillin Sod-Tazobactam So]     Patient reports it makes her vomit, her neck stiff, and her "heart feel funny"  . Fentanyl Rash    Pt is allergic to fentanyl patch related to the glue (gives her a  rash) Pt states she is NOT allergic to fentanyl IV medicine  . Morphine And Related Rash    Family History:  Family History  Problem Relation Age of Onset  . Breast cancer Sister   . Multiple sclerosis Sister   . Diabetes Sister   . Lupus Sister   . Colon cancer Other   . Crohn's disease Other   . Seizures Mother   . Glaucoma Mother   . CAD Father   . Heart disease Father   . Hypertension Father      Current Outpatient Medications:  .  acetaminophen (TYLENOL) 325 MG tablet, Take 650 mg by mouth every 6 (six) hours as needed for mild pain. , Disp: , Rfl:  .  amLODipine (NORVASC) 10 MG tablet, Take 1 tablet (10 mg total) by mouth daily., Disp: 90 tablet, Rfl: 1 .  budesonide (ENTOCORT EC) 3 MG 24 hr capsule, TAKE 3 CAPSULES BY MOUTH ONCE DAILY, Disp: 90 capsule, Rfl: 0 .  Calcium 200 MG TABS, Take 200 mg by mouth daily., Disp: , Rfl:  .  ceFEPime (MAXIPIME) IVPB, Inject 2 g into the vein every 12 (twelve) hours. Indication:  Discitis/osteomyelitis  First Dose: Yes Last Day of Therapy:  03/24/2021 Labs - Once weekly:  CBC/D and BMP, Labs - Every other week:  ESR and CRP Method of administration: IV Push Method of administration may be changed at the discretion of home infusion pharmacist based upon assessment of the patient and/or caregiver's ability to self-administer the medication ordered., Disp: 78 Units, Rfl: 0 .  cholecalciferol (VITAMIN D3) 25 MCG (1000 UT) tablet, Take 1,000 Units by mouth daily. , Disp: , Rfl:  .  cycloSPORINE (RESTASIS) 0.05 % ophthalmic emulsion, Place 1 drop into both eyes 2 (two) times daily., Disp: , Rfl:  .  daptomycin (CUBICIN) IVPB, Inject 600 mg into the vein daily. Indication: discitis/osteomyelitis First Dose: Yes Last Day of Therapy:  03/24/2021 Labs - Once weekly:  CBC/D, BMP, and CPK Labs - Every other week:  ESR and CRP Method of administration: IV Push Method of administration may be changed at the discretion of home infusion pharmacist based upon assessment of the patient and/or caregiver's ability to self-administer the medication ordered. (Patient taking differently: Inject 600 mg into the vein daily. Indication: discitis/osteomyelitis First Dose: Yes Last Day of Therapy:  03/24/2021 Labs - Once weekly:  CBC/D, BMP, and CPK Labs - Every other week:  ESR and CRP Method of administration: IV Push Method of administration may be changed at the discretion of home infusion pharmacist based upon assessment of the patient and/or caregiver's ability to self-administer the medication ordered.), Disp: 39 Units, Rfl: 0 .  dexlansoprazole (DEXILANT) 60 MG capsule, Take 1 capsule (60 mg total) by mouth daily., Disp: 90 capsule, Rfl: 1 .  Dextran 70-Hypromellose 0.1-0.3 % SOLN, Place 1 drop into both eyes 4 (four) times daily., Disp: , Rfl:  .  diphenoxylate-atropine (LOMOTIL) 2.5-0.025 MG tablet, TAKE 1 TABLET BY MOUTH 4 TIMES DAILY AS NEEDED FOR DIARRHEA OR  LOOSE  STOOLS (Patient taking differently: TAKE 1 TABLET BY MOUTH 4  TIMES DAILY AS NEEDED FOR DIARRHEA OR  LOOSE  STOOLS), Disp: 90 tablet, Rfl: 1 .  DULoxetine (CYMBALTA) 30 MG capsule, Take 3 capsules (90 mg total) by mouth daily., Disp: 90 capsule, Rfl: 1 .  estradiol (ESTRACE) 2 MG tablet, Take 1 tablet (2 mg total) by mouth daily., Disp: 90 tablet, Rfl: 1 .  GATTEX 5 MG KIT, Inject 5  mLs into the skin daily., Disp: , Rfl:  .  HYDROmorphone (DILAUDID) 4 MG tablet, Take 4 mg by mouth 4 (four) times daily as needed for moderate pain. , Disp: , Rfl:  .  levETIRAcetam (KEPPRA) 500 MG tablet, Take 1 tablet (500 mg total) by mouth 2 (two) times daily., Disp: 60 tablet, Rfl: 11 .  lipase/protease/amylase (CREON) 36000 UNITS CPEP capsule, Take 1 capsule (36,000 Units total) by mouth 3 (three) times daily as needed (with meals for digestion)., Disp: 180 capsule, Rfl: 0 .  loperamide (IMODIUM) 2 MG capsule, TAKE 1 CAPSULE BY MOUTH AS NEEDED FOR DIARRHEA OR LOOSE STOOLS, Disp: 60 capsule, Rfl: 2 .  methadone (DOLOPHINE) 5 MG tablet, Take 1 tablet (5 mg total) by mouth 5 (five) times daily., Disp: 30 tablet, Rfl: 0 .  metoprolol tartrate (LOPRESSOR) 100 MG tablet, Take 1 tablet (100 mg total) by mouth 2 (two) times daily., Disp: 90 tablet, Rfl: 1 .  Multiple Vitamins-Minerals (MULTIVITAMIN ADULT PO), Take 1 tablet by mouth daily., Disp: , Rfl:  .  NARCAN 4 MG/0.1ML LIQD nasal spray kit, Place 1 spray into the nose once., Disp: , Rfl:  .  PRESCRIPTION MEDICATION, Inject 1 each into the vein daily. Home TPN . [facility] [employer] . 1 bag for 12 hours. [phone], Disp: , Rfl:  .  Probiotic Product (PROBIOTIC-10 PO), Take 1 capsule by mouth daily. , Disp: , Rfl:  .  promethazine (PHENERGAN) 25 MG tablet, Take 1 tablet (25 mg total) by mouth every 6 (six) hours as needed for nausea or vomiting., Disp: 90 tablet, Rfl: 0 .  sodium chloride 0.9 % infusion, Inject into the vein., Disp: , Rfl:  .  sucralfate (CARAFATE) 1 GM/10ML suspension, Take 10 mLs (1 g  total) by mouth 4 (four) times daily -  with meals and at bedtime., Disp: 420 mL, Rfl: 2 .  Trace Minerals Cu-Mn-Se-Zn (TRALEMENT IV), Inject 1 mL into the vein. Used in TPN bag 4 times weekly, Disp: , Rfl:  .  vitamin B-12 (CYANOCOBALAMIN) 100 MCG tablet, Take 1,000 mcg by mouth daily. , Disp: , Rfl:  .  hydrALAZINE (APRESOLINE) 50 MG tablet, Take 1.5 tablets (75 mg total) by mouth 3 (three) times daily., Disp: 405 tablet, Rfl: 3  Review of Systems:  Constitutional: Denies fever, chills, diaphoresis, appetite change and fatigue.  HEENT: Denies photophobia, eye pain, redness, hearing loss, ear pain, congestion, sore throat, rhinorrhea, sneezing, mouth sores, trouble swallowing, neck pain, neck stiffness and tinnitus.   Respiratory: Denies SOB, DOE, cough, chest tightness,  and wheezing.   Cardiovascular: Denies chest pain, palpitations and leg swelling.  Gastrointestinal: Denies nausea, vomiting, abdominal pain, diarrhea, constipation, blood in stool and abdominal distention.  Genitourinary: Denies dysuria, urgency, frequency, hematuria, flank pain and difficulty urinating.  Endocrine: Denies: hot or cold intolerance, sweats, changes in hair or nails, polyuria, polydipsia. Musculoskeletal: Denies myalgias, back pain, joint swelling, arthralgias and gait problem.  Skin: Denies pallor, rash and wound.  Neurological: Denies dizziness, seizures, syncope, weakness, light-headedness, numbness and headaches.  Hematological: Denies adenopathy. Easy bruising, personal or family bleeding history  Psychiatric/Behavioral: Denies suicidal ideation, mood changes, confusion, nervousness, sleep disturbance and agitation    Physical Exam: Vitals:   03/02/21 1313  BP: 120/70  Pulse: 74  Temp: 98.3 F (36.8 C)  TempSrc: Oral  SpO2: 99%  Weight: 137 lb 12.8 oz (62.5 kg)    Body mass index is 21.58 kg/m.   Constitutional: NAD, calm,  comfortable Eyes: PERRL, lids and conjunctivae normal ENMT: Mucous  membranes are moist.  Respiratory: clear to auscultation bilaterally, no wheezing, no crackles. Normal respiratory effort. No accessory muscle use.  Cardiovascular: Regular rate and rhythm, no murmurs / rubs / gallops.  Neurologic: Grossly intact and nonfocal Psychiatric: Normal judgment and insight. Alert and oriented x 3. Normal mood.    Impression and Plan:  Hospital discharge follow-up Thoracic discitis -She is improving, back pain is better although not completely resolved. -She will continue daptomycin and cefepime through PICC line until April 29. -She has follow-up with infectious diseases on May 5.  Localized osteoporosis without current pathological fracture  - Plan: denosumab (PROLIA) injection 60 mg in office today.   Seizures ([place]) -Continue Keppra.    [name], MD [facility]

## 2021-03-04 ENCOUNTER — Encounter (INDEPENDENT_AMBULATORY_CARE_PROVIDER_SITE_OTHER): Payer: Self-pay

## 2021-03-04 ENCOUNTER — Other Ambulatory Visit: Payer: Self-pay | Admitting: Internal Medicine

## 2021-03-04 DIAGNOSIS — K50818 Crohn's disease of both small and large intestine with other complication: Secondary | ICD-10-CM

## 2021-03-06 MED ORDER — BUDESONIDE 3 MG PO CPEP: 9.0000 mg | ORAL_CAPSULE | Freq: Every day | ORAL | 0 refills | Status: DC

## 2021-03-08 ENCOUNTER — Emergency Department (HOSPITAL_COMMUNITY)
Admission: EM | Admit: 2021-03-08 | Discharge: 2021-03-08 | Disposition: A | Discharge status: Home / Self Care | Payer: Medicare Other | Attending: Emergency Medicine | Admitting: Emergency Medicine

## 2021-03-08 ENCOUNTER — Other Ambulatory Visit: Payer: Self-pay

## 2021-03-08 ENCOUNTER — Encounter (HOSPITAL_COMMUNITY): Payer: Self-pay

## 2021-03-08 ENCOUNTER — Emergency Department (HOSPITAL_COMMUNITY): Payer: Medicare Other

## 2021-03-08 DIAGNOSIS — Z79899 Other long term (current) drug therapy: Secondary | ICD-10-CM | POA: Diagnosis not present

## 2021-03-08 DIAGNOSIS — N183 Chronic kidney disease, stage 3 unspecified: Secondary | ICD-10-CM | POA: Diagnosis not present

## 2021-03-08 DIAGNOSIS — K219 Gastro-esophageal reflux disease without esophagitis: Secondary | ICD-10-CM | POA: Insufficient documentation

## 2021-03-08 DIAGNOSIS — R1013 Epigastric pain: Secondary | ICD-10-CM | POA: Diagnosis present

## 2021-03-08 DIAGNOSIS — G8929 Other chronic pain: Secondary | ICD-10-CM | POA: Diagnosis not present

## 2021-03-08 DIAGNOSIS — Z87891 Personal history of nicotine dependence: Secondary | ICD-10-CM | POA: Diagnosis not present

## 2021-03-08 DIAGNOSIS — R112 Nausea with vomiting, unspecified: Secondary | ICD-10-CM | POA: Diagnosis not present

## 2021-03-08 DIAGNOSIS — I129 Hypertensive chronic kidney disease with stage 1 through stage 4 chronic kidney disease, or unspecified chronic kidney disease: Secondary | ICD-10-CM | POA: Diagnosis not present

## 2021-03-08 DIAGNOSIS — R109 Unspecified abdominal pain: Secondary | ICD-10-CM

## 2021-03-08 DIAGNOSIS — Z8616 Personal history of COVID-19: Secondary | ICD-10-CM | POA: Insufficient documentation

## 2021-03-08 LAB — CBC WITH DIFFERENTIAL/PLATELET
Abs Immature Granulocytes: 0.01 10*3/uL (ref 0.00–0.07)
Basophils Absolute: 0 10*3/uL (ref 0.0–0.1)
Basophils Relative: 0 %
Eosinophils Absolute: 0.1 10*3/uL (ref 0.0–0.5)
Eosinophils Relative: 2 %
HCT: 28.6 % — ABNORMAL LOW (ref 36.0–46.0)
Hemoglobin: 9.3 g/dL — ABNORMAL LOW (ref 12.0–15.0)
Immature Granulocytes: 0 %
Lymphocytes Relative: 48 %
Lymphs Abs: 2.1 10*3/uL (ref 0.7–4.0)
MCH: 29.8 pg (ref 26.0–34.0)
MCHC: 32.5 g/dL (ref 30.0–36.0)
MCV: 91.7 fL (ref 80.0–100.0)
Monocytes Absolute: 0.3 10*3/uL (ref 0.1–1.0)
Monocytes Relative: 6 %
Neutro Abs: 2 10*3/uL (ref 1.7–7.7)
Neutrophils Relative %: 44 %
Platelets: 175 10*3/uL (ref 150–400)
RBC: 3.12 MIL/uL — ABNORMAL LOW (ref 3.87–5.11)
RDW: 15.6 % — ABNORMAL HIGH (ref 11.5–15.5)
WBC: 4.5 10*3/uL (ref 4.0–10.5)
nRBC: 0 % (ref 0.0–0.2)

## 2021-03-08 LAB — URINALYSIS, ROUTINE W REFLEX MICROSCOPIC
Bacteria, UA: NONE SEEN
Bilirubin Urine: NEGATIVE
Glucose, UA: NEGATIVE mg/dL
Hgb urine dipstick: NEGATIVE
Ketones, ur: NEGATIVE mg/dL
Nitrite: NEGATIVE
Protein, ur: >=300 mg/dL — AB
Specific Gravity, Urine: 1.02 (ref 1.005–1.030)
pH: 6 (ref 5.0–8.0)

## 2021-03-08 LAB — COMPREHENSIVE METABOLIC PANEL
ALT: 28 U/L (ref 0–44)
AST: 26 U/L (ref 15–41)
Albumin: 2.9 g/dL — ABNORMAL LOW (ref 3.5–5.0)
Alkaline Phosphatase: 91 U/L (ref 38–126)
Anion gap: 9 (ref 5–15)
BUN: 28 mg/dL — ABNORMAL HIGH (ref 6–20)
CO2: 20 mmol/L — ABNORMAL LOW (ref 22–32)
Calcium: 7.8 mg/dL — ABNORMAL LOW (ref 8.9–10.3)
Chloride: 112 mmol/L — ABNORMAL HIGH (ref 98–111)
Creatinine, Ser: 1.05 mg/dL — ABNORMAL HIGH (ref 0.44–1.00)
GFR, Estimated: >60 mL/min (ref >60)
Glucose, Bld: 99 mg/dL (ref 70–99)
Potassium: 4.3 mmol/L (ref 3.5–5.1)
Sodium: 141 mmol/L (ref 135–145)
Total Bilirubin: 0.6 mg/dL (ref 0.3–1.2)
Total Protein: 6.9 g/dL (ref 6.5–8.1)

## 2021-03-08 LAB — LIPASE, BLOOD: Lipase: 34 U/L (ref 11–51)

## 2021-03-08 MED ORDER — ONDANSETRON HCL 4 MG/2ML IJ SOLN
4.0000 mg | Freq: Once | INTRAMUSCULAR | Status: AC
  Administered 2021-03-08: 4 mg via INTRAVENOUS
  Filled 2021-03-08: qty 2

## 2021-03-08 MED ORDER — HEPARIN SOD (PORK) LOCK FLUSH 100 UNIT/ML IV SOLN
500.0000 [IU] | Freq: Once | INTRAVENOUS | Status: AC
  Administered 2021-03-08: 500 [IU]
  Filled 2021-03-08: qty 5

## 2021-03-08 MED ORDER — HYDROMORPHONE HCL 1 MG/ML IJ SOLN
1.0000 mg | Freq: Once | INTRAMUSCULAR | Status: AC
  Administered 2021-03-08: 1 mg via INTRAVENOUS
  Filled 2021-03-08: qty 1

## 2021-03-08 MED ORDER — SODIUM CHLORIDE 0.9 % IV BOLUS
1000.0000 mL | Freq: Once | INTRAVENOUS | Status: AC
  Administered 2021-03-08: 1000 mL via INTRAVENOUS

## 2021-03-08 MED ORDER — METOCLOPRAMIDE HCL 5 MG/ML IJ SOLN
10.0000 mg | Freq: Once | INTRAMUSCULAR | Status: AC
  Administered 2021-03-08: 10 mg via INTRAVENOUS
  Filled 2021-03-08: qty 2

## 2021-03-08 MED ORDER — HYDROMORPHONE HCL 1 MG/ML IJ SOLN: 1.0000 mg | Freq: Once | INTRAMUSCULAR | Status: DC

## 2021-03-08 NOTE — ED Provider Notes (Signed)
[facility] DEPT Provider Note   CSN: [id number] Arrival date & time: 03/08/21  1323     History Chief Complaint  Patient presents with  . Abdominal Pain  . Emesis  . Nausea    Caitlin Peters is a 60 y.o. female.  Patient with h/o Crohn's disease, s/p bowel resection for same, recent hospitalization from March 17 through March 23 for T2/T5 discitis after she presented to the ED with a 1 week history of worsening back pain, plan for daptomycin and cefepime until 4/29, on TPN, h/o multiple abdominal surgeries -- presents for worsening abdominal pain, nausea and vomiting over the past 3 days.  Patient states that her issues are similar to previous episodes of "inflammation" that she gets due to the Crohn's disease.  She mainly has pain in the epigastrium and left side of the abdomen.  She has been extremely nauseous.  Oral pain medications have not helped.  She continues to get TPN through a line in her leg.        Past Medical History:  Diagnosis Date  . Acute pancreatitis 04/13/2020  . Anasarca 10/2019  . AVN (avascular necrosis of bone) ([place])   . Cataract   . Choledocholithiasis (sludge) s/p ERCP 10/2019 10/21/2020  . Chronic pain syndrome   . CKD (chronic kidney disease), stage III ([place])   . Crohn disease ([place])   . Crohn's disease of small & large intestine with SGS 1984   GRAINNE KNIGHTS is a 60year old female with a history of Crohn's disease diagnosed in 49 (age 6), history of long-term steroid use with osteoporosis, S/P multiple bowel resections ([id number]) complicated by chronic back and abdominal pain, steatorrhea and short bowel syndrome. The patient has been left with ~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 attached to proximal transverse colon through rectum. She has been  . Depression   . Diverticulosis   . GERD (gastroesophageal reflux disease)   . HTN (hypertension)   . IDA (iron deficiency anemia)   . Malnutrition ([place])   . Mass in chest   .  Osteoporosis   . Osteoporosis 12/24/2014  . Pancreatitis   . 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 Vitamin B12 deficiency     Patient Active Problem List   Diagnosis Date Noted  . Thoracic discitis 02/10/2021  . Pressure injury of skin 12/18/2020  . Bacteremia 12/17/2020  . Seizures ([place]) 12/17/2020  . Drug-seeking behavior 12/16/2020  . Seizure ([place]) 12/15/2020  . CKD (chronic kidney disease) stage 3, GFR 30-59 ml/min ([name]) 12/06/2020  . COVID-19 virus infection 12/06/2020  . Protein calorie malnutrition ([place]) 12/06/2020  . Palpitations 11/22/2020  . PAC (premature atrial contraction) 11/22/2020  . History of central line-associated bloodstream infection (CLABSI) 10/21/2020  . Nausea & vomiting 10/21/2020  . Choledocholithiasis (sludge) s/p ERCP 10/2019 10/21/2020  . 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 Abdominal pain 09/07/2020  . Acute metabolic encephalopathy   . Hypomagnesemia   . Partial small bowel obstruction ([place])   . Bacteremia associated with intravascular line ([place]) 08/04/2020  . Enterobacter sepsis ([place]) 07/22/2020  . Anxiety 06/03/2020  . Acute pancreatitis 04/13/2020  . Infection due to Acinetobacter species 03/10/2020  . Pancytopenia ([place]) 03/05/2020  . Central line infection   .  Elevated liver enzymes 01/02/2020  . Cholangitis   . Anasarca 10/28/2019  . Acute kidney injury superimposed on chronic kidney disease ([place]) 10/28/2019  . Falls 10/28/2019  . Malnutrition ([place])   . Vitamin B12 deficiency   . GERD (gastroesophageal reflux disease)   . Chronic pain syndrome   . Hypokalemia due to excessive gastrointestinal loss of potassium 10/13/2019  . Fever   . Hypokalemia 10/12/2019  . Chronic diarrhea  10/12/2019  . Dysuria 10/12/2019  . Bilateral lower extremity edema 10/12/2019  . AKI (acute kidney injury) ([place]) 10/12/2019  . Fungemia 08/27/2019  . IDA (iron deficiency anemia) 11/03/2018  . Dilation of biliary tract 08/28/2018  . Severe diarrhea 03/07/2018  . LFTs abnormal 01/27/2018  . GI tract obstruction ([place]) 01/27/2018  . Gram-negative bacteremia 09/13/2017  . HTN (hypertension), benign 12/02/2016  . Intractable pain 12/02/2016  . Anemia 12/02/2016  . 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 Avascular necrosis ([place]) 06/14/2015  . Polyarthralgia 05/03/2015  . On total parenteral nutrition (TPN) 12/30/2014  . Osteoporosis 12/24/2014  . Low back pain 12/16/2014  . Vitamin D deficiency 12/16/2014  . 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 History of colonic diverticulitis 2014  . Depression 07/24/2012  . Gram-positive bacteremia 07/24/2012  . Small bowel obstruction due to adhesions ([place]) 07/24/2012  . Diarrhea 12/13/2011  . Neuralgia and neuritis 06/01/2011  . Myalgia and myositis 08/12/2003  . Crohn's disease of small & large intestine with SGS 1984    Past Surgical History:  Procedure Laterality Date  . ABDOMINAL ADHESION SURGERY  01/22/2018  . APPENDECTOMY  1989  . BILIARY DILATION  11/26/2019   Procedure: BILIARY DILATION;  Surgeon: Jackquline Denmark, MD;  Location: [facility];  Service: Endoscopy;;  . BILIARY DILATION  03/08/2020   Procedure: BILIARY DILATION;  Surgeon: Irving Copas., MD;  Location: [facility];  Service: Gastroenterology;;  . BIOPSY  03/08/2020   Procedure: BIOPSY;  Surgeon: Irving Copas., MD;  Location: [facility];  Service: Gastroenterology;;  . CHEST WALL RESECTION     right thoracotomy,resection of chest mass with anterior rib and reconstruction using prosthetic mesh and video arthroscopy  . CHOLECYSTECTOMY  01/22/2018  . COLONOSCOPY  2019  . ENTEROSTOMY CLOSURE  04/1999  . ERCP N/A 11/26/2019   Procedure: ENDOSCOPIC RETROGRADE  CHOLANGIOPANCREATOGRAPHY (ERCP);  Surgeon: Jackquline Denmark, MD;  Location: [facility];  Service: Endoscopy;  Laterality: N/A;  . ERCP N/A 03/08/2020   Procedure: ENDOSCOPIC RETROGRADE CHOLANGIOPANCREATOGRAPHY (ERCP);  Surgeon: Irving Copas., MD;  Location: [facility];  Service: Gastroenterology;  Laterality: N/A;  . ESOPHAGOGASTRODUODENOSCOPY N/A 03/08/2020   Procedure: ESOPHAGOGASTRODUODENOSCOPY (EGD);  Surgeon: Irving Copas., MD;  Location: [facility];  Service: Gastroenterology;  Laterality: N/A;  . EUS N/A 03/08/2020   Procedure: UPPER ENDOSCOPIC ULTRASOUND (EUS) LINEAR;  Surgeon: Irving Copas., MD;  Location: [facility];  Service: Gastroenterology;  Laterality: N/A;  . ILEOCECETOMY  03/1999   ileocolon resection with abdominal stoma  . ILEOSTOMY CLOSURE  2001  . IR FLUORO GUIDE CV LINE LEFT  01/07/2020  . IR FLUORO GUIDE CV LINE LEFT  03/09/2020  . IR FLUORO GUIDE CV LINE LEFT  05/09/2020  . IR FLUORO GUIDE CV LINE LEFT  07/20/2020  . IR FLUORO GUIDE CV LINE RIGHT  08/05/2020  . IR PTA VENOUS EXCEPT DIALYSIS CIRCUIT  01/07/2020  . IR REMOVAL TUN CV CATH W/O FL  08/05/2020  . IR US GUIDE VASC ACCESS LEFT     x 2 06/17/19 and 09/14/2019  .  IR US GUIDE VASC ACCESS RIGHT  08/05/2020  . KNEE SURGERY     right knee   . LAPAROSCOPIC SMALL BOWEL RESECTION  2009   2000-2009.  SB resections for Crohns Disease - now with Short gut  . OMENTECTOMY  01/22/2018  . PARTIAL HYSTERECTOMY  1984   with LSO  . REMOVAL OF STONES  11/26/2019   Procedure: REMOVAL OF STONES;  Surgeon: Jackquline Denmark, MD;  Location: [facility];  Service: Endoscopy;;  . REMOVAL OF STONES  03/08/2020   Procedure: REMOVAL OF STONES;  Surgeon: Irving Copas., MD;  Location: [facility];  Service: Gastroenterology;;  . SALPINGOOPHORECTOMY Left 1984  . SALPINGOOPHORECTOMY Right 1990  . SERIAL TRANSVERSE ENTEROPLASTY (STEP) - SMALL BOWEL LENGTHENING  01/22/2018   Dr Alene Mires, [place] length from 120 to 165cm   . SMALL INTESTINE SURGERY  2002  . SMALL INTESTINE SURGERY  2003  . SPHINCTEROTOMY  11/26/2019   Procedure: SPHINCTEROTOMY;  Surgeon: Jackquline Denmark, MD;  Location: [facility];  Service: Endoscopy;;  . TOTAL ABDOMINAL HYSTERECTOMY  1990   with RSO  . UPPER GASTROINTESTINAL ENDOSCOPY       OB History   No obstetric history on file.     Family History  Problem Relation Age of Onset  . Breast cancer Sister   . Multiple sclerosis Sister   . Diabetes Sister   . Lupus Sister   . Colon cancer Other   . Crohn's disease Other   . Seizures Mother   . Glaucoma Mother   . CAD Father   . Heart disease Father   . Hypertension Father     Social History   Tobacco Use  . Smoking status: Former Research scientist (life sciences)  . Smokeless tobacco: Never Used  Vaping Use  . Vaping Use: Never used  Substance Use Topics  . Alcohol use: Not Currently  . Drug use: Never    Home Medications Prior to Admission medications   Medication Sig Start Date End Date Taking? Authorizing Provider  acetaminophen (TYLENOL) 325 MG tablet Take 650 mg by mouth every 6 (six) hours as needed for mild pain.     [provider]  amLODipine (NORVASC) 10 MG tablet Take 1 tablet (10 mg total) by mouth daily. 12/07/20 01/06/21  Erline Hau, MD  budesonide (ENTOCORT EC) 3 MG 24 hr capsule Take 3 capsules (9 mg total) by mouth daily. 03/06/21   [name], [name], MD  Calcium 200 MG TABS Take 200 mg by mouth daily.    [provider]  ceFEPime (MAXIPIME) IVPB Inject 2 g into the vein every 12 (twelve) hours. Indication:  Discitis/osteomyelitis First Dose: Yes Last Day of Therapy:  03/24/2021 Labs - Once weekly:  CBC/D and BMP, Labs - Every other week:  ESR and CRP Method of administration: IV Push Method of administration may be changed at the discretion of home infusion pharmacist based upon assessment of the patient and/or caregiver's ability to self-administer the  medication ordered. 02/15/21 03/26/21  Pokhrel, Corrie Mckusick, MD  cholecalciferol (VITAMIN D3) 25 MCG (1000 UT) tablet Take 1,000 Units by mouth daily.     [provider]  cycloSPORINE (RESTASIS) 0.05 % ophthalmic emulsion Place 1 drop into both eyes 2 (two) times daily.    [provider]  daptomycin (CUBICIN) IVPB Inject 600 mg into the vein daily. Indication: discitis/osteomyelitis First Dose: Yes Last Day of Therapy:  03/24/2021 Labs - Once weekly:  CBC/D, BMP, and CPK Labs -  Every other week:  ESR and CRP Method of administration: IV Push Method of administration may be changed at the discretion of home infusion pharmacist based upon assessment of the patient and/or caregiver's ability to self-administer the medication ordered. Patient taking differently: Inject 600 mg into the vein daily. Indication: discitis/osteomyelitis First Dose: Yes Last Day of Therapy:  03/24/2021 Labs - Once weekly:  CBC/D, BMP, and CPK Labs - Every other week:  ESR and CRP Method of administration: IV Push Method of administration may be changed at the discretion of home infusion pharmacist based upon assessment of the patient and/or caregiver's ability to self-administer the medication ordered. 02/15/21 03/26/21  Pokhrel, Corrie Mckusick, MD  dexlansoprazole (DEXILANT) 60 MG capsule Take 1 capsule (60 mg total) by mouth daily. 12/28/20   [name], [name], MD  Dextran 70-Hypromellose 0.1-0.3 % SOLN Place 1 drop into both eyes 4 (four) times daily.    [provider]  diphenoxylate-atropine (LOMOTIL) 2.5-0.025 MG tablet TAKE 1 TABLET BY MOUTH 4 TIMES DAILY AS NEEDED FOR DIARRHEA OR  LOOSE  STOOLS Patient taking differently: TAKE 1 TABLET BY MOUTH 4 TIMES DAILY AS NEEDED FOR DIARRHEA OR  LOOSE  STOOLS 12/28/20   [name], [name], MD  DULoxetine (CYMBALTA) 30 MG capsule Take 3 capsules (90 mg total) by mouth daily. 12/07/20   [name], [name], MD  estradiol (ESTRACE) 2 MG tablet Take 1  tablet (2 mg total) by mouth daily. 12/07/20   Erline Hau, MD  GATTEX 5 MG KIT Inject 5 mLs into the skin daily. 01/03/21   [provider]  hydrALAZINE (APRESOLINE) 50 MG tablet Take 1.5 tablets (75 mg total) by mouth 3 (three) times daily. 11/22/20 02/20/21  Rudean Haskell A, MD  HYDROmorphone (DILAUDID) 4 MG tablet Take 4 mg by mouth 4 (four) times daily as needed for moderate pain.  09/15/20   [provider]  levETIRAcetam (KEPPRA) 500 MG tablet Take 1 tablet (500 mg total) by mouth 2 (two) times daily. 02/24/21 03/26/21  Cameron Sprang, MD  lipase/protease/amylase (CREON) 36000 UNITS CPEP capsule Take 1 capsule (36,000 Units total) by mouth 3 (three) times daily as needed (with meals for digestion). 06/28/20   [name], [name], MD  loperamide (IMODIUM) 2 MG capsule TAKE 1 CAPSULE BY MOUTH AS NEEDED FOR DIARRHEA OR LOOSE STOOLS 01/20/21   [name], [name], MD  methadone (DOLOPHINE) 5 MG tablet Take 1 tablet (5 mg total) by mouth 5 (five) times daily. 06/30/20   Florencia Reasons, MD  metoprolol tartrate (LOPRESSOR) 100 MG tablet Take 1 tablet (100 mg total) by mouth 2 (two) times daily. 02/21/21   [name], [name], MD  Multiple Vitamins-Minerals (MULTIVITAMIN ADULT PO) Take 1 tablet by mouth daily.    [provider]  NARCAN 4 MG/0.1ML LIQD nasal spray kit Place 1 spray into the nose once. 10/14/20   [provider]  PRESCRIPTION MEDICATION Inject 1 each into the vein daily. Home TPN . [facility] [place] . 1 bag for 12 hours. [phone]    [provider]  Probiotic Product (PROBIOTIC-10 PO) Take 1 capsule by mouth daily.     [provider]  promethazine (PHENERGAN) 25 MG tablet Take 1 tablet (25 mg total) by mouth every 6 (six) hours as needed for nausea or vomiting. 02/21/21   [name], [name], MD  sodium chloride 0.9 % infusion Inject into the vein. 10/16/20   [provider]  sucralfate (CARAFATE) 1 GM/10ML suspension Take 10 mLs (1 g total) by mouth 4 (four) times daily -  with meals and at bedtime. 06/28/20   [name], [name], MD  Trace Minerals Cu-Mn-Se-Zn (TRALEMENT IV) Inject 1 mL into the vein. Used in TPN bag 4 times weekly    [provider]  vitamin B-12 (CYANOCOBALAMIN) 100 MCG tablet Take 1,000 mcg by mouth daily.     [provider]    Allergies    Meperidine, Hyoscyamine, Cefepime, Gabapentin, Lyrica [pregabalin], Topamax [topiramate], Zosyn [piperacillin sod-tazobactam so], Fentanyl, and Morphine and related  Review of Systems   Review of Systems  Constitutional: Negative for chills and fever.  HENT: Negative for rhinorrhea and sore throat.   Eyes: Negative for redness.  Respiratory: Negative for cough and shortness of breath.   Cardiovascular: Negative for chest pain.  Gastrointestinal: Positive for abdominal pain, nausea and vomiting. Negative for diarrhea.  Genitourinary: Negative for dysuria, frequency, hematuria and urgency.  Musculoskeletal: Negative for myalgias.  Skin: Negative for rash.  Neurological: Negative for headaches.    Physical Exam Updated Vital Signs BP (!) 157/77   Pulse 68   Temp 98.4 F (36.9 C)   Resp 16   SpO2 100%   Physical Exam Vitals and nursing note reviewed.  Constitutional:      General: She is not in acute distress.    Appearance: She is well-developed.  HENT:     Head: Normocephalic and atraumatic.     Right Ear: External ear normal.     Left Ear: External ear normal.     Nose: Nose normal.  Eyes:     Conjunctiva/sclera: Conjunctivae normal.  Cardiovascular:     Rate and Rhythm: Normal rate and regular rhythm.     Heart sounds: No murmur heard.   Pulmonary:     Effort: No respiratory distress.     Breath sounds: No wheezing, rhonchi or rales.  Abdominal:     Palpations: Abdomen is soft.     Tenderness: There is generalized abdominal tenderness  (pt winces with even light palpation wherever I touch). There is no guarding or rebound.  Musculoskeletal:     Cervical back: Normal range of motion and neck supple.     Right lower leg: No edema.     Left lower leg: No edema.  Skin:    General: Skin is warm and dry.     Findings: No rash.  Neurological:     General: No focal deficit present.     Mental Status: She is alert. Mental status is at baseline.     Motor: No weakness.  Psychiatric:        Mood and Affect: Mood normal.     ED Results / Procedures / Treatments   Labs (all labs ordered are listed, but only abnormal results are displayed) Labs Reviewed  CBC WITH DIFFERENTIAL/PLATELET - Abnormal; Notable for the following components:      Result Value   RBC 3.12 (*)    Hemoglobin 9.3 (*)    HCT 28.6 (*)    RDW 15.6 (*)    All other components within normal limits  COMPREHENSIVE METABOLIC PANEL - Abnormal; Notable for the following components:   Chloride 112 (*)    CO2 20 (*)    BUN 28 (*)    Creatinine, Ser 1.05 (*)    Calcium 7.8 (*)    Albumin 2.9 (*)    All other components within normal limits  LIPASE, BLOOD  URINALYSIS, ROUTINE W REFLEX MICROSCOPIC    EKG None  Radiology DG Abd 2 Views  Result Date: 03/08/2021 CLINICAL DATA:  Nausea and vomiting. c/o left sided abdominal pain, nausea and emesis x 3 days. Hx of Crohn's, Pt states issues similar to prior flares. Taking ABX for spinal infection. EXAM: ABDOMEN - 2 VIEW COMPARISON:  X-ray abdomen 02/14/2021 FINDINGS: Nonspecific air-fluid levels on the upright view. No definite dilatation of the small or large bowel. Multiple and Modic bowel sutures are noted. No definite free intraperitoneal gas on the upright view. Phleboliths overlie the pelvis. No radio-opaque calculi or other significant radiographic abnormality is seen. Right femoral approach central catheter with tip terminating over the expected region of the inferior vena cava. Visualized lower thoraces are  unremarkable.  No pleural effusions. IMPRESSION: Nonspecific air-fluid levels with otherwise a nonobstructive bowel gas pattern. Electronically Signed   By: Iven Finn M.D.   On: 03/08/2021 15:21    Procedures Procedures   Medications Ordered in ED Medications  metoCLOPramide (REGLAN) injection 10 mg (has no administration in time range)  HYDROmorphone (DILAUDID) injection 1 mg (has no administration in time range)  sodium chloride 0.9 % bolus 1,000 mL (0 mLs Intravenous Stopped 03/08/21 1544)  HYDROmorphone (DILAUDID) injection 1 mg (1 mg Intravenous Given 03/08/21 1436)  ondansetron (ZOFRAN) injection 4 mg (4 mg Intravenous Given 03/08/21 1436)  HYDROmorphone (DILAUDID) injection 1 mg (1 mg Intravenous Given 03/08/21 1547)  sodium chloride 0.9 % bolus 1,000 mL (0 mLs Intravenous Stopped 03/08/21 1651)    ED Course  I have reviewed the triage vital signs and the nursing notes.  Pertinent labs & imaging results that were available during my care of the patient were reviewed by me and considered in my medical decision making (see chart for details).  Patient seen and examined. Work-up initiated. Medications ordered. Pt has had 12 abdominal CTs over the past 12 months.   Vital signs reviewed and are as follows: BP (!) 157/77   Pulse 68   Temp 98.4 F (36.9 C)   Resp 16   SpO2 100%   5:10 PM Patient has received 2 fluid boluses and 2 doses of pain medication. She states that she is feeling somewhat better. Will reassess.   We discussed labs look close to her baseline.   5:53 PM Patient improving but still having pain and nausea.  No vomiting.  Additional medication ordered.   Signout to [employer] at shift change.   Recheck after UA results. Plan for likely d/c.     MDM Rules/Calculators/A&P                          Patient with abdominal pain, chronic in nature, h/o Crohn's. Vitals are stable, no fever. Labs at her baseline. Imaging non-specific bowel gas, do not feel CT is  indicated today given previous history and improvement. No signs of dehydration, patient is tolerating PO's. Lungs are clear and no signs suggestive of PNA. Low concern for appendicitis, cholecystitis, pancreatitis, ruptured viscus, UTI, kidney stone, aortic dissection, aortic aneurysm or other emergent abdominal etiology. Supportive therapy indicated with return if symptoms worsen.     Final Clinical Impression(s) / ED Diagnoses Final diagnoses:  Chronic abdominal pain  Non-intractable vomiting with nausea, unspecified vomiting type    Rx / DC Orders ED Discharge Orders    None       Carlisle Cater, PA-C 03/08/21 1810    Horton,  Alvin Critchley, DO 03/09/21 2002

## 2021-03-08 NOTE — ED Triage Notes (Signed)
EMS reports from home, c/o left sided abdominal pain, nausea and emesis x 3 days. Hx of Crohn's, Pt states issues similar to prior flares. Taking ABX for spinal infection.  BP 160/84 HR 62 RR 16 Sp02 98 RA Temp 97.4 CBG 110

## 2021-03-08 NOTE — Discharge Instructions (Signed)
Please read and follow all provided instructions.  Your diagnoses today include:  1. Chronic abdominal pain   2. Non-intractable vomiting with nausea, unspecified vomiting type     Tests performed today include:  Blood cell counts and platelets  Kidney and liver function tests  Pancreas function test (called lipase)  Urine test to look for infection  X-ray - does not suggest bowel obstruction  Vital signs. See below for your results today.   Medications prescribed:   None  Take any prescribed medications only as directed.  Home care instructions:   Follow any educational materials contained in this packet.  Follow-up instructions: Please follow-up with your primary care provider in the next 3 days for further evaluation of your symptoms.    Return instructions:  SEEK IMMEDIATE MEDICAL ATTENTION IF:  The pain does not go away or becomes severe   A temperature above 101F develops   Repeated vomiting occurs (multiple episodes)   The pain becomes localized to portions of the abdomen. The right side could possibly be appendicitis. In an adult, the left lower portion of the abdomen could be colitis or diverticulitis.   Blood is being passed in stools or vomit (bright red or black tarry stools)   You develop chest pain, difficulty breathing, dizziness or fainting, or become confused, poorly responsive, or inconsolable (young children)  If you have any other emergent concerns regarding your health  Additional Information: Abdominal (belly) pain can be caused by many things. Your caregiver performed an examination and possibly ordered blood/urine tests and imaging (CT scan, x-rays, ultrasound). Many cases can be observed and treated at home after initial evaluation in the emergency department. Even though you are being discharged home, abdominal pain can be unpredictable. Therefore, you need a repeated exam if your pain does not resolve, returns, or worsens. Most patients  with abdominal pain don't have to be admitted to the hospital or have surgery, but serious problems like appendicitis and gallbladder attacks can start out as nonspecific pain. Many abdominal conditions cannot be diagnosed in one visit, so follow-up evaluations are very important.  Your vital signs today were: BP (!) 147/67   Pulse 77   Temp 98.4 F (36.9 C)   Resp 18   SpO2 100%  If your blood pressure (bp) was elevated above 135/85 this visit, please have this repeated by your doctor within one month. --------------

## 2021-03-08 NOTE — ED Provider Notes (Signed)
Care assumed from [place] at shift change, please see his note for full details, but in brief Caitlin Peters is a 60 y.o. female with history of Crohn's disease s/p bowel resection, with chronic abdominal pain, as well as recent hospitalization for thoracic discitis presents for 1 week of worsening abdominal pain and vomiting.  She reports she has had numerous episodes of similar symptoms previously.  Pain primarily in the epigastric and left side of the abdomen.  Oral pain medications not helping with pain at home.  Receives most of her nutrition from TPN.  Lab work thus far has been reassuring as well as abdominal plain films.  Urinalysis pending.  After treatment with IV pain medication symptoms seem to be improving.  Without significant derangement and lab work and overall reassuring exam, prior provider did not feel that CT was warranted and patient has had numerous prior abdominal CTs.  Plan: Follow-up on UA and reevaluate patient's pain.  Anticipate discharge home, on chronic Dilaudid and methadone daily.  BP (!) 146/69   Pulse 77   Temp 98.4 F (36.9 C)   Resp 18   SpO2 100%    ED Course/Procedures   Labs Reviewed  CBC WITH DIFFERENTIAL/PLATELET - Abnormal; Notable for the following components:      Result Value   RBC 3.12 (*)    Hemoglobin 9.3 (*)    HCT 28.6 (*)    RDW 15.6 (*)    All other components within normal limits  COMPREHENSIVE METABOLIC PANEL - Abnormal; Notable for the following components:   Chloride 112 (*)    CO2 20 (*)    BUN 28 (*)    Creatinine, Ser 1.05 (*)    Calcium 7.8 (*)    Albumin 2.9 (*)    All other components within normal limits  URINALYSIS, ROUTINE W REFLEX MICROSCOPIC - Abnormal; Notable for the following components:   APPearance HAZY (*)    Protein, ur >=300 (*)    Leukocytes,Ua MODERATE (*)    All other components within normal limits  LIPASE, BLOOD    DG Abd 2 Views  Result Date: 03/08/2021 CLINICAL DATA:  Nausea and  vomiting. c/o left sided abdominal pain, nausea and emesis x 3 days. Hx of Crohn's, Pt states issues similar to prior flares. Taking ABX for spinal infection. EXAM: ABDOMEN - 2 VIEW COMPARISON:  X-ray abdomen 02/14/2021 FINDINGS: Nonspecific air-fluid levels on the upright view. No definite dilatation of the small or large bowel. Multiple and Modic bowel sutures are noted. No definite free intraperitoneal gas on the upright view. Phleboliths overlie the pelvis. No radio-opaque calculi or other significant radiographic abnormality is seen. Right femoral approach central catheter with tip terminating over the expected region of the inferior vena cava. Visualized lower thoraces are unremarkable.  No pleural effusions. IMPRESSION: Nonspecific air-fluid levels with otherwise a nonobstructive bowel gas pattern. Electronically Signed   By: Iven Finn M.D.   On: 03/08/2021 15:21     Procedures  MDM   UA shows moderate leuks with some yeast present, no bacteria noted.  I discussed this with patient, she has had yeast infections intermittently as she is currently on chronic antibiotics but is not currently having symptoms of this and also denies any dysuria or urinary frequency.  Suspect this is likely colonization in the setting of long-term antibiotic use we will have her monitor for symptoms of yeast infection discussed with PCP if these occur.  Will give 1 additional dose of pain  medicine, abdominal exam is overall reassuring.  Will discharge home with close outpatient follow-up with PCP and return precautions discussed.  Patient expresses understanding and agreement.  Discharged home in good condition.       Jacqlyn Larsen, PA-C 03/08/21 2050    Valarie Merino, MD 03/10/21 2034

## 2021-03-10 ENCOUNTER — Other Ambulatory Visit: Payer: Self-pay

## 2021-03-10 ENCOUNTER — Inpatient Hospital Stay (HOSPITAL_COMMUNITY)
Admission: EM | Admit: 2021-03-10 | Discharge: 2021-03-13 | DRG: 866 | Disposition: A | Discharge status: Home Health | Payer: Medicare Other | Attending: Internal Medicine | Admitting: Internal Medicine

## 2021-03-10 ENCOUNTER — Encounter (HOSPITAL_COMMUNITY): Payer: Self-pay

## 2021-03-10 ENCOUNTER — Encounter (HOSPITAL_COMMUNITY): Payer: Self-pay | Admitting: Emergency Medicine

## 2021-03-10 ENCOUNTER — Emergency Department (HOSPITAL_COMMUNITY): Payer: Medicare Other

## 2021-03-10 ENCOUNTER — Emergency Department (HOSPITAL_COMMUNITY)
Admission: EM | Admit: 2021-03-10 | Discharge: 2021-03-10 | Disposition: A | Discharge status: Home / Self Care | Payer: Medicare Other | Source: Home / Self Care | Attending: Emergency Medicine | Admitting: Emergency Medicine

## 2021-03-10 DIAGNOSIS — Z8249 Family history of ischemic heart disease and other diseases of the circulatory system: Secondary | ICD-10-CM | POA: Diagnosis not present

## 2021-03-10 DIAGNOSIS — K219 Gastro-esophageal reflux disease without esophagitis: Secondary | ICD-10-CM | POA: Diagnosis present

## 2021-03-10 DIAGNOSIS — I10 Essential (primary) hypertension: Secondary | ICD-10-CM | POA: Diagnosis present

## 2021-03-10 DIAGNOSIS — K50818 Crohn's disease of both small and large intestine with other complication: Secondary | ICD-10-CM | POA: Diagnosis present

## 2021-03-10 DIAGNOSIS — G894 Chronic pain syndrome: Secondary | ICD-10-CM | POA: Diagnosis not present

## 2021-03-10 DIAGNOSIS — Z8616 Personal history of COVID-19: Secondary | ICD-10-CM | POA: Insufficient documentation

## 2021-03-10 DIAGNOSIS — R197 Diarrhea, unspecified: Secondary | ICD-10-CM | POA: Diagnosis present

## 2021-03-10 DIAGNOSIS — Z79899 Other long term (current) drug therapy: Secondary | ICD-10-CM | POA: Insufficient documentation

## 2021-03-10 DIAGNOSIS — F32A Depression, unspecified: Secondary | ICD-10-CM | POA: Diagnosis not present

## 2021-03-10 DIAGNOSIS — R1084 Generalized abdominal pain: Secondary | ICD-10-CM | POA: Insufficient documentation

## 2021-03-10 DIAGNOSIS — Z7952 Long term (current) use of systemic steroids: Secondary | ICD-10-CM | POA: Diagnosis not present

## 2021-03-10 DIAGNOSIS — M4644 Discitis, unspecified, thoracic region: Secondary | ICD-10-CM | POA: Diagnosis not present

## 2021-03-10 DIAGNOSIS — R569 Unspecified convulsions: Secondary | ICD-10-CM

## 2021-03-10 DIAGNOSIS — Z90710 Acquired absence of both cervix and uterus: Secondary | ICD-10-CM

## 2021-03-10 DIAGNOSIS — D638 Anemia in other chronic diseases classified elsewhere: Secondary | ICD-10-CM | POA: Diagnosis not present

## 2021-03-10 DIAGNOSIS — M4624 Osteomyelitis of vertebra, thoracic region: Secondary | ICD-10-CM | POA: Diagnosis not present

## 2021-03-10 DIAGNOSIS — K5080 Crohn's disease of both small and large intestine without complications: Secondary | ICD-10-CM | POA: Diagnosis present

## 2021-03-10 DIAGNOSIS — Z87891 Personal history of nicotine dependence: Secondary | ICD-10-CM

## 2021-03-10 DIAGNOSIS — M810 Age-related osteoporosis without current pathological fracture: Secondary | ICD-10-CM | POA: Diagnosis not present

## 2021-03-10 DIAGNOSIS — N183 Chronic kidney disease, stage 3 unspecified: Secondary | ICD-10-CM | POA: Diagnosis present

## 2021-03-10 DIAGNOSIS — Z9049 Acquired absence of other specified parts of digestive tract: Secondary | ICD-10-CM | POA: Diagnosis not present

## 2021-03-10 DIAGNOSIS — G40909 Epilepsy, unspecified, not intractable, without status epilepticus: Secondary | ICD-10-CM | POA: Diagnosis not present

## 2021-03-10 DIAGNOSIS — B349 Viral infection, unspecified: Principal | ICD-10-CM | POA: Diagnosis present

## 2021-03-10 DIAGNOSIS — K912 Postsurgical malabsorption, not elsewhere classified: Secondary | ICD-10-CM | POA: Diagnosis not present

## 2021-03-10 DIAGNOSIS — E538 Deficiency of other specified B group vitamins: Secondary | ICD-10-CM | POA: Diagnosis present

## 2021-03-10 DIAGNOSIS — R112 Nausea with vomiting, unspecified: Secondary | ICD-10-CM | POA: Insufficient documentation

## 2021-03-10 DIAGNOSIS — Z881 Allergy status to other antibiotic agents status: Secondary | ICD-10-CM

## 2021-03-10 DIAGNOSIS — K565 Intestinal adhesions [bands], unspecified as to partial versus complete obstruction: Secondary | ICD-10-CM

## 2021-03-10 DIAGNOSIS — Z20822 Contact with and (suspected) exposure to covid-19: Secondary | ICD-10-CM | POA: Diagnosis present

## 2021-03-10 DIAGNOSIS — M549 Dorsalgia, unspecified: Secondary | ICD-10-CM | POA: Insufficient documentation

## 2021-03-10 DIAGNOSIS — R109 Unspecified abdominal pain: Secondary | ICD-10-CM | POA: Diagnosis present

## 2021-03-10 DIAGNOSIS — N1831 Chronic kidney disease, stage 3a: Secondary | ICD-10-CM | POA: Diagnosis present

## 2021-03-10 DIAGNOSIS — I129 Hypertensive chronic kidney disease with stage 1 through stage 4 chronic kidney disease, or unspecified chronic kidney disease: Secondary | ICD-10-CM | POA: Diagnosis present

## 2021-03-10 DIAGNOSIS — Z885 Allergy status to narcotic agent status: Secondary | ICD-10-CM

## 2021-03-10 DIAGNOSIS — Z888 Allergy status to other drugs, medicaments and biological substances status: Secondary | ICD-10-CM

## 2021-03-10 DIAGNOSIS — F1120 Opioid dependence, uncomplicated: Secondary | ICD-10-CM | POA: Diagnosis present

## 2021-03-10 DIAGNOSIS — K529 Noninfective gastroenteritis and colitis, unspecified: Secondary | ICD-10-CM | POA: Diagnosis present

## 2021-03-10 DIAGNOSIS — K90829 Short bowel syndrome, unspecified: Secondary | ICD-10-CM | POA: Diagnosis present

## 2021-03-10 DIAGNOSIS — N184 Chronic kidney disease, stage 4 (severe): Secondary | ICD-10-CM | POA: Diagnosis present

## 2021-03-10 DIAGNOSIS — K50819 Crohn's disease of both small and large intestine with unspecified complications: Secondary | ICD-10-CM | POA: Diagnosis present

## 2021-03-10 LAB — CBC WITH DIFFERENTIAL/PLATELET
Abs Immature Granulocytes: 0 10*3/uL (ref 0.00–0.07)
Basophils Absolute: 0 10*3/uL (ref 0.0–0.1)
Basophils Relative: 1 %
Eosinophils Absolute: 0 10*3/uL (ref 0.0–0.5)
Eosinophils Relative: 1 %
HCT: 29.2 % — ABNORMAL LOW (ref 36.0–46.0)
Hemoglobin: 9.6 g/dL — ABNORMAL LOW (ref 12.0–15.0)
Immature Granulocytes: 0 %
Lymphocytes Relative: 47 %
Lymphs Abs: 2.1 10*3/uL (ref 0.7–4.0)
MCH: 29.7 pg (ref 26.0–34.0)
MCHC: 32.9 g/dL (ref 30.0–36.0)
MCV: 90.4 fL (ref 80.0–100.0)
Monocytes Absolute: 0.2 10*3/uL (ref 0.1–1.0)
Monocytes Relative: 5 %
Neutro Abs: 2 10*3/uL (ref 1.7–7.7)
Neutrophils Relative %: 46 %
Platelets: 182 10*3/uL (ref 150–400)
RBC: 3.23 MIL/uL — ABNORMAL LOW (ref 3.87–5.11)
RDW: 15.4 % (ref 11.5–15.5)
WBC: 4.3 10*3/uL (ref 4.0–10.5)
nRBC: 0 % (ref 0.0–0.2)

## 2021-03-10 LAB — COMPREHENSIVE METABOLIC PANEL
ALT: 76 U/L — ABNORMAL HIGH (ref 0–44)
AST: 50 U/L — ABNORMAL HIGH (ref 15–41)
Albumin: 3.1 g/dL — ABNORMAL LOW (ref 3.5–5.0)
Alkaline Phosphatase: 96 U/L (ref 38–126)
Anion gap: 7 (ref 5–15)
BUN: 22 mg/dL — ABNORMAL HIGH (ref 6–20)
CO2: 21 mmol/L — ABNORMAL LOW (ref 22–32)
Calcium: 7.5 mg/dL — ABNORMAL LOW (ref 8.9–10.3)
Chloride: 110 mmol/L (ref 98–111)
Creatinine, Ser: 1.03 mg/dL — ABNORMAL HIGH (ref 0.44–1.00)
GFR, Estimated: >60 mL/min (ref >60)
Glucose, Bld: 102 mg/dL — ABNORMAL HIGH (ref 70–99)
Potassium: 4.2 mmol/L (ref 3.5–5.1)
Sodium: 138 mmol/L (ref 135–145)
Total Bilirubin: 0.4 mg/dL (ref 0.3–1.2)
Total Protein: 7.1 g/dL (ref 6.5–8.1)

## 2021-03-10 LAB — LIPASE, BLOOD: Lipase: 35 U/L (ref 11–51)

## 2021-03-10 MED ORDER — SODIUM CHLORIDE 0.9 % IV BOLUS
1000.0000 mL | Freq: Once | INTRAVENOUS | Status: AC
  Administered 2021-03-10: 1000 mL via INTRAVENOUS

## 2021-03-10 MED ORDER — HYDROMORPHONE HCL 1 MG/ML IJ SOLN
1.0000 mg | Freq: Once | INTRAMUSCULAR | Status: AC
Start: 2021-03-10 — End: 2021-03-10
  Administered 2021-03-10: 1 mg via INTRAVENOUS
  Filled 2021-03-10: qty 1

## 2021-03-10 MED ORDER — SODIUM CHLORIDE 0.9 % IV SOLN: INTRAVENOUS | Status: DC

## 2021-03-10 MED ORDER — IOHEXOL 300 MG/ML  SOLN
100.0000 mL | Freq: Once | INTRAMUSCULAR | Status: AC | PRN
  Administered 2021-03-10: 100 mL via INTRAVENOUS

## 2021-03-10 MED ORDER — HYDROMORPHONE HCL 1 MG/ML IJ SOLN
1.0000 mg | Freq: Once | INTRAMUSCULAR | Status: AC
  Administered 2021-03-10: 1 mg via INTRAVENOUS
  Filled 2021-03-10: qty 1

## 2021-03-10 MED ORDER — SODIUM CHLORIDE 0.9 % IV SOLN
12.5000 mg | Freq: Once | INTRAVENOUS | Status: AC
  Administered 2021-03-10: 12.5 mg via INTRAVENOUS
  Filled 2021-03-10: qty 0.5

## 2021-03-10 MED ORDER — ACETAMINOPHEN 325 MG PO TABS
650.0000 mg | ORAL_TABLET | Freq: Once | ORAL | Status: AC
  Administered 2021-03-10: 650 mg via ORAL
  Filled 2021-03-10: qty 2

## 2021-03-10 NOTE — ED Triage Notes (Signed)
Patient arrived with EMS from home reports 3 weeks of generalized body aches , denies injury or fall , ran out of her pain medications prescribed at pain clinic . She was seen at [facility] ER this morning for abdominal pain discharged home /received multiple doses of IV Dilaudid at [facility] this morning .

## 2021-03-10 NOTE — ED Provider Notes (Addendum)
Caitlin Peters   CSN: [id number] Arrival date & time: 03/10/21  8413     History Chief Complaint  Patient presents with  . Abdominal Pain  . Nausea  . Vomiting    Caitlin Peters is a 60 y.o. female.  Patient coming from home with a complaint of left lower quadrant pain nausea and vomiting for 4 days.  Pain does radiate to the back.  Has a history of Crohn's disease.  Has a right femoral tunneled catheter.  Patient apparently followed by gastroenterology at [facility]  Also followed by pain management.  Patient did state that she has run out of her pain medications.  Patient known to have a history of chronic pain syndrome and acute pancreatitis.  Patient states she has been losing weight over the past several months.  Patient was seen for the same complaint on April 13 had x-rays done of the abdomen and lab work done without any significant findings.  Patient also has a history of discitis and is taking antibiotics for spinal infection.  The patient denies any lower extremity neurologic symptoms or any incontinence.  Patient states she does have antinausea medicine at home.  Patient is currently not on any steroids for her Crohn's disease.  She states they do not help anymore.        Past Medical History:  Diagnosis Date  . Acute pancreatitis 04/13/2020  . Anasarca 10/2019  . AVN (avascular necrosis of bone) ([place])   . Cataract   . Choledocholithiasis (sludge) s/p ERCP 10/2019 10/21/2020  . Chronic pain syndrome   . CKD (chronic kidney disease), stage III ([place])   . Crohn disease ([place])   . Crohn's disease of small & large intestine with SGS 1984   Caitlin Peters is a 60year old female with a history of Crohn's disease diagnosed in 28 (age 78), history of long-term steroid use with osteoporosis, S/P multiple bowel resections (2440-1027) complicated by chronic back and abdominal pain, steatorrhea and short bowel syndrome. The  patient has been left with ~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 attached to proximal transverse colon through rectum. She has been  . Depression   . Diverticulosis   . GERD (gastroesophageal reflux disease)   . HTN (hypertension)   . IDA (iron deficiency anemia)   . Malnutrition ([place])   . Mass in chest   . Osteoporosis   . Osteoporosis 12/24/2014  . Pancreatitis   . 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 Vitamin B12 deficiency     Patient Active Problem List   Diagnosis Date Noted  . Thoracic discitis 02/10/2021  . Pressure injury of skin 12/18/2020  . Bacteremia 12/17/2020  . Seizures ([place]) 12/17/2020  . Drug-seeking behavior 12/16/2020  . Seizure ([place]) 12/15/2020  . CKD (chronic kidney disease) stage 3, GFR 30-59 ml/min ([name]) 12/06/2020  . COVID-19 virus infection 12/06/2020  . Protein calorie malnutrition ([place]) 12/06/2020  . Palpitations 11/22/2020  . PAC (premature atrial contraction) 11/22/2020  . History of central line-associated bloodstream infection (CLABSI) 10/21/2020  . Nausea & vomiting 10/21/2020  . Choledocholithiasis (sludge) s/p ERCP 10/2019 10/21/2020  . 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 Abdominal pain 09/07/2020  . Acute metabolic encephalopathy   . Hypomagnesemia   .  Partial small bowel obstruction ([place])   . Bacteremia associated with intravascular line ([place]) 08/04/2020  . Enterobacter sepsis ([place]) 07/22/2020  . Anxiety 06/03/2020  . Acute pancreatitis 04/13/2020  . Infection due to Acinetobacter species 03/10/2020  . Pancytopenia ([place]) 03/05/2020  . Central line infection   . Elevated liver enzymes 01/02/2020  . Cholangitis   . Anasarca 10/28/2019  . Acute kidney injury superimposed on chronic kidney disease  ([place]) 10/28/2019  . Falls 10/28/2019  . Malnutrition ([place])   . Vitamin B12 deficiency   . GERD (gastroesophageal reflux disease)   . Chronic pain syndrome   . Hypokalemia due to excessive gastrointestinal loss of potassium 10/13/2019  . Fever   . Hypokalemia 10/12/2019  . Chronic diarrhea 10/12/2019  . Dysuria 10/12/2019  . Bilateral lower extremity edema 10/12/2019  . AKI (acute kidney injury) ([place]) 10/12/2019  . Fungemia 08/27/2019  . IDA (iron deficiency anemia) 11/03/2018  . Dilation of biliary tract 08/28/2018  . Severe diarrhea 03/07/2018  . LFTs abnormal 01/27/2018  . GI tract obstruction ([place]) 01/27/2018  . Gram-negative bacteremia 09/13/2017  . HTN (hypertension), benign 12/02/2016  . Intractable pain 12/02/2016  . Anemia 12/02/2016  . 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 Avascular necrosis ([place]) 06/14/2015  . Polyarthralgia 05/03/2015  . On total parenteral nutrition (TPN) 12/30/2014  . Osteoporosis 12/24/2014  . Low back pain 12/16/2014  . Vitamin D deficiency 12/16/2014  . 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 History of colonic diverticulitis 2014  . Depression 07/24/2012  . Gram-positive bacteremia 07/24/2012  . Small bowel obstruction due to adhesions ([place]) 07/24/2012  . Diarrhea 12/13/2011  . Neuralgia and neuritis 06/01/2011  . Myalgia and myositis 08/12/2003  . Crohn's disease of small & large intestine with SGS 1984    Past Surgical History:  Procedure Laterality Date  . ABDOMINAL ADHESION SURGERY  01/22/2018  . APPENDECTOMY  1989  . BILIARY DILATION  11/26/2019   Procedure: BILIARY DILATION;  Surgeon: Jackquline Denmark, MD;  Location: [facility];  Service: Endoscopy;;  . BILIARY DILATION  03/08/2020   Procedure: BILIARY DILATION;  Surgeon: Irving Copas., MD;  Location: [place];  Service: Gastroenterology;;  . BIOPSY  03/08/2020   Procedure: BIOPSY;  Surgeon: Irving Copas., MD;  Location: [place];  Service: Gastroenterology;;  .  CHEST WALL RESECTION     right thoracotomy,resection of chest mass with anterior rib and reconstruction using prosthetic mesh and video arthroscopy  . CHOLECYSTECTOMY  01/22/2018  . COLONOSCOPY  2019  . ENTEROSTOMY CLOSURE  04/1999  . ERCP N/A 11/26/2019   Procedure: ENDOSCOPIC RETROGRADE CHOLANGIOPANCREATOGRAPHY (ERCP);  Surgeon: Jackquline Denmark, MD;  Location: [facility];  Service: Endoscopy;  Laterality: N/A;  . ERCP N/A 03/08/2020   Procedure: ENDOSCOPIC RETROGRADE CHOLANGIOPANCREATOGRAPHY (ERCP);  Surgeon: Irving Copas., MD;  Location: [place];  Service: Gastroenterology;  Laterality: N/A;  . ESOPHAGOGASTRODUODENOSCOPY N/A 03/08/2020   Procedure: ESOPHAGOGASTRODUODENOSCOPY (EGD);  Surgeon: Irving Copas., MD;  Location: [place];  Service: Gastroenterology;  Laterality: N/A;  . EUS N/A 03/08/2020   Procedure: UPPER ENDOSCOPIC ULTRASOUND (EUS) LINEAR;  Surgeon: Irving Copas., MD;  Location: [place];  Service: Gastroenterology;  Laterality: N/A;  . ILEOCECETOMY  03/1999   ileocolon resection with abdominal stoma  . ILEOSTOMY CLOSURE  2001  . IR FLUORO GUIDE CV LINE LEFT  01/07/2020  . IR FLUORO GUIDE CV LINE LEFT  03/09/2020  . IR FLUORO GUIDE CV LINE LEFT  05/09/2020  . IR FLUORO GUIDE CV LINE LEFT  07/20/2020  . IR FLUORO GUIDE CV LINE RIGHT  08/05/2020  . IR PTA VENOUS EXCEPT DIALYSIS CIRCUIT  01/07/2020  . IR REMOVAL TUN CV CATH W/O FL  08/05/2020  . IR US GUIDE VASC ACCESS LEFT     x 2 06/17/19 and 09/14/2019  . IR US GUIDE VASC ACCESS RIGHT  08/05/2020  . KNEE SURGERY     right knee   . LAPAROSCOPIC SMALL BOWEL RESECTION  2009   2000-2009.  SB resections for Crohns Disease - now with Short gut  . OMENTECTOMY  01/22/2018  . PARTIAL HYSTERECTOMY  1984   with LSO  . REMOVAL OF STONES  11/26/2019   Procedure: REMOVAL OF STONES;  Surgeon: Jackquline Denmark, MD;  Location: [facility];  Service: Endoscopy;;  . REMOVAL OF STONES  03/08/2020    Procedure: REMOVAL OF STONES;  Surgeon: Irving Copas., MD;  Location: [place];  Service: Gastroenterology;;  . SALPINGOOPHORECTOMY Left 1984  . SALPINGOOPHORECTOMY Right 1990  . SERIAL TRANSVERSE ENTEROPLASTY (STEP) - SMALL BOWEL LENGTHENING  01/22/2018   Dr Alene Mires, [facility] [place] length from 120 to 165cm   . SMALL INTESTINE SURGERY  2002  . SMALL INTESTINE SURGERY  2003  . SPHINCTEROTOMY  11/26/2019   Procedure: SPHINCTEROTOMY;  Surgeon: Jackquline Denmark, MD;  Location: [facility];  Service: Endoscopy;;  . TOTAL ABDOMINAL HYSTERECTOMY  1990   with RSO  . UPPER GASTROINTESTINAL ENDOSCOPY       OB History   No obstetric history on file.     Family History  Problem Relation Age of Onset  . Breast cancer Sister   . Multiple sclerosis Sister   . Diabetes Sister   . Lupus Sister   . Colon cancer Other   . Crohn's disease Other   . Seizures Mother   . Glaucoma Mother   . CAD Father   . Heart disease Father   . Hypertension Father     Social History   Tobacco Use  . Smoking status: Former Research scientist (life sciences)  . Smokeless tobacco: Never Used  Vaping Use  . Vaping Use: Never used  Substance Use Topics  . Alcohol use: Not Currently  . Drug use: Never    Home Medications Prior to Admission medications   Medication Sig Start Date End Date Taking? Authorizing Provider  acetaminophen (TYLENOL) 325 MG tablet Take 650 mg by mouth every 6 (six) hours as needed for mild pain.     [provider]  amLODipine (NORVASC) 10 MG tablet Take 1 tablet (10 mg total) by mouth daily. 12/07/20 01/06/21  Erline Hau, MD  budesonide (ENTOCORT EC) 3 MG 24 hr capsule Take 3 capsules (9 mg total) by mouth daily. Patient taking differently: Take 3 mg by mouth every other day. 03/06/21   [name], [name], MD  buPROPion [facility] SR) 100 MG 12 hr tablet Take 200 mg by mouth daily. 03/02/21   [provider]  Calcium 200 MG TABS Take 200 mg by mouth  daily.    [provider]  ceFEPime (MAXIPIME) IVPB Inject 2 g into the vein every 12 (twelve) hours. Indication:  Discitis/osteomyelitis First Dose: Yes Last Day of Therapy:  03/24/2021 Labs - Once weekly:  CBC/D and BMP, Labs - Every other week:  ESR and CRP Method of administration: IV Push Method of administration may be changed at the discretion of home infusion pharmacist based upon assessment of the patient and/or caregiver's ability to self-administer the medication ordered.  02/15/21 03/26/21  Pokhrel, Corrie Mckusick, MD  cholecalciferol (VITAMIN D3) 25 MCG (1000 UT) tablet Take 1,000 Units by mouth daily.     [provider]  cycloSPORINE (RESTASIS) 0.05 % ophthalmic emulsion Place 1 drop into both eyes 2 (two) times daily.    [provider]  daptomycin (CUBICIN) IVPB Inject 600 mg into the vein daily. Indication: discitis/osteomyelitis First Dose: Yes Last Day of Therapy:  03/24/2021 Labs - Once weekly:  CBC/D, BMP, and CPK Labs - Every other week:  ESR and CRP Method of administration: IV Push Method of administration may be changed at the discretion of home infusion pharmacist based upon assessment of the patient and/or caregiver's ability to self-administer the medication ordered. Patient taking differently: Inject 600 mg into the vein daily. Indication: discitis/osteomyelitis First Dose: Yes Last Day of Therapy:  03/24/2021 Labs - Once weekly:  CBC/D, BMP, and CPK Labs - Every other week:  ESR and CRP Method of administration: IV Push Method of administration may be changed at the discretion of home infusion pharmacist based upon assessment of the patient and/or caregiver's ability to self-administer the medication ordered. 02/15/21 03/26/21  Pokhrel, Corrie Mckusick, MD  dexlansoprazole (DEXILANT) 60 MG capsule Take 1 capsule (60 mg total) by mouth daily. 12/28/20   [name], [name], MD  Dextran 70-Hypromellose 0.1-0.3 % SOLN Place 1 drop into both eyes 4 (four)  times daily.    [provider]  diphenoxylate-atropine (LOMOTIL) 2.5-0.025 MG tablet TAKE 1 TABLET BY MOUTH 4 TIMES DAILY AS NEEDED FOR DIARRHEA OR  LOOSE  STOOLS Patient taking differently: Take 1-2 tablets by mouth 4 (four) times daily as needed for diarrhea or loose stools. 12/28/20   [name], [name], MD  DULoxetine (CYMBALTA) 30 MG capsule Take 3 capsules (90 mg total) by mouth daily. 12/07/20   [name], [name], MD  estradiol (ESTRACE) 2 MG tablet Take 1 tablet (2 mg total) by mouth daily. 12/07/20   Erline Hau, MD  GATTEX 5 MG KIT Inject 5 mLs into the skin daily. 01/03/21   [provider]  hydrALAZINE (APRESOLINE) 50 MG tablet Take 1.5 tablets (75 mg total) by mouth 3 (three) times daily. 11/22/20 02/20/21  Rudean Haskell A, MD  HYDROmorphone (DILAUDID) 4 MG tablet Take 4 mg by mouth 4 (four) times daily as needed for moderate pain.  09/15/20   [provider]  levETIRAcetam (KEPPRA) 500 MG tablet Take 1 tablet (500 mg total) by mouth 2 (two) times daily. 02/24/21 03/26/21  Cameron Sprang, MD  lipase/protease/amylase (CREON) 36000 UNITS CPEP capsule Take 1 capsule (36,000 Units total) by mouth 3 (three) times daily as needed (with meals for digestion). 06/28/20   [name], [name], MD  loperamide (IMODIUM) 2 MG capsule TAKE 1 CAPSULE BY MOUTH AS NEEDED FOR DIARRHEA OR LOOSE STOOLS Patient not taking: No sig reported 01/20/21   [name], [name], MD  methadone (DOLOPHINE) 5 MG tablet Take 1 tablet (5 mg total) by mouth 5 (five) times daily. 06/30/20   Florencia Reasons, MD  metoprolol tartrate (LOPRESSOR) 100 MG tablet Take 1 tablet (100 mg total) by mouth 2 (two) times daily. 02/21/21   [name], [name], MD  Multiple Vitamins-Minerals (MULTIVITAMIN ADULT PO) Take 1 tablet by mouth daily.    [provider]  NARCAN 4 MG/0.1ML LIQD nasal spray kit Place 1 spray into the nose once as needed (overdose). 10/14/20    [provider]  [place]  1 each into the vein daily. Home TPN . [facility] . 1 bag for 12 hours. [phone]    [provider]  Probiotic Product (PROBIOTIC-10 PO) Take 1 capsule by mouth daily.     [provider]  promethazine (PHENERGAN) 25 MG tablet Take 1 tablet (25 mg total) by mouth every 6 (six) hours as needed for nausea or vomiting. 02/21/21   [name], [name], MD  sodium chloride 0.9 % infusion Inject 1 mL into the vein daily as needed (flush). 10/16/20   [provider]  sucralfate (CARAFATE) 1 GM/10ML suspension Take 10 mLs (1 g total) by mouth 4 (four) times daily -  with meals and at bedtime. 06/28/20   [name], [name], MD  Trace Minerals Cu-Mn-Se-Zn (TRALEMENT IV) Inject 1 mL into the vein See admin instructions. Used in TPN bag 4 times weekly    [provider]  vitamin B-12 (CYANOCOBALAMIN) 100 MCG tablet Take 1,000 mcg by mouth daily.     [provider]    Allergies    Meperidine, Hyoscyamine, Cefepime, Gabapentin, Lyrica [pregabalin], Topamax [topiramate], Zosyn [piperacillin sod-tazobactam so], Fentanyl, and Morphine and related  Review of Systems   Review of Systems  Constitutional: Negative for chills and fever.  HENT: Negative for rhinorrhea and sore throat.   Eyes: Negative for visual disturbance.  Respiratory: Negative for cough and shortness of breath.   Cardiovascular: Negative for chest pain and leg swelling.  Gastrointestinal: Positive for abdominal pain, nausea and vomiting. Negative for diarrhea.  Genitourinary: Negative for dysuria.  Musculoskeletal: Positive for back pain. Negative for neck pain.  Skin: Negative for rash.  Neurological: Negative for dizziness, light-headedness and headaches.  Hematological: Does not bruise/bleed easily.  Psychiatric/Behavioral: Negative for confusion.    Physical Exam Updated Vital Signs BP  (!) 186/73   Pulse 80   Temp 98 F (36.7 C) (Oral)   Resp 17   SpO2 100%   Physical Exam Vitals and nursing Peters reviewed.  Constitutional:      General: She is not in acute distress.    Appearance: Normal appearance. She is well-developed.  HENT:     Head: Normocephalic and atraumatic.     Mouth/Throat:     Mouth: Mucous membranes are dry.  Eyes:     Extraocular Movements: Extraocular movements intact.     Conjunctiva/sclera: Conjunctivae normal.     Pupils: Pupils are equal, round, and reactive to light.  Cardiovascular:     Rate and Rhythm: Normal rate and regular rhythm.     Heart sounds: No murmur heard.   Pulmonary:     Effort: Pulmonary effort is normal. No respiratory distress.     Breath sounds: Normal breath sounds.  Abdominal:     General: There is no distension.     Palpations: Abdomen is soft.     Tenderness: There is abdominal tenderness.     Comments: Some generalized tenderness without guarding  Musculoskeletal:        General: Normal range of motion.     Cervical back: Normal range of motion and neck supple.     Comments: Femoral catheter right thigh area.  Skin:    General: Skin is warm and dry.     Capillary Refill: Capillary refill takes less than 2 seconds.  Neurological:     General: No focal deficit present.     Mental Status: She is alert and oriented to person, place, and time.  Cranial Nerves: No cranial nerve deficit.     Sensory: No sensory deficit.     ED Results / Procedures / Treatments   Labs (all labs ordered are listed, but only abnormal results are displayed) Labs Reviewed  COMPREHENSIVE METABOLIC PANEL - Abnormal; Notable for the following components:      Result Value   CO2 21 (*)    Glucose, Bld 102 (*)    BUN 22 (*)    Creatinine, Ser 1.03 (*)    Calcium 7.5 (*)    Albumin 3.1 (*)    AST 50 (*)    ALT 76 (*)    All other components within normal limits  CBC WITH DIFFERENTIAL/PLATELET - Abnormal; Notable for the  following components:   RBC 3.23 (*)    Hemoglobin 9.6 (*)    HCT 29.2 (*)    All other components within normal limits  LIPASE, BLOOD    EKG None  Radiology DG Abdomen 1 View  Result Date: 03/10/2021 CLINICAL DATA:  60 year old female with right femoral catheter, tunneled catheter in right leg. Crohn disease. EXAM: ABDOMEN - 1 VIEW COMPARISON:  Abdominal radiographs 03/08/2021 and earlier. FINDINGS: Portable AP supine view at at 0939 hours. Right femoral approach vascular catheter has stable configuration in the right inguinal region from 2 days ago. It tracks cephalad, with the tip at the level of the hepatic IVC. No kinking or discontinuity identified. Non obstructed bowel gas pattern. Multiple bowel staple lines in the bilateral abdomen. Stable visualized osseous structures. Levoconvex lumbar scoliosis. IMPRESSION: 1. Right femoral approach vascular catheter terminates at the hepatic IVC level. No adverse features identified. 2. Postoperative changes to the abdomen. Nonobstructed bowel-gas pattern. Electronically Signed   By: Genevie Ann M.D.   On: 03/10/2021 10:10   CT Abdomen Pelvis W Contrast  Result Date: 03/10/2021 CLINICAL DATA:  60 year old with nausea, vomiting and abdominal pain. History of Crohn's disease. EXAM: CT ABDOMEN AND PELVIS WITH CONTRAST TECHNIQUE: Multidetector CT imaging of the abdomen and pelvis was performed using the standard protocol following bolus administration of intravenous contrast. CONTRAST:  11m OMNIPAQUE IOHEXOL 300 MG/ML  SOLN COMPARISON:  02/09/2021 FINDINGS: Lower chest: Lung bases are clear.  No pleural effusions. Hepatobiliary: Normal appearance of the liver. The gallbladder is absent. Main portal venous system is patent. Extrahepatic bile duct measures roughly 6 mm. No significant biliary dilatation. Pancreas: Mild pancreatic duct dilatation is stable. No peripancreatic inflammation. Spleen: Normal in size without focal abnormality. Adrenals/Urinary Tract:  Normal adrenal glands. Normal appearance of the kidneys. No hydronephrosis. No suspicious renal lesions. Moderate distention of the urinary bladder. Stomach/Bowel: Multiple surgical changes throughout the bowel. Scattered fluid-filled loops of bowel, largest in the left lateral abdomen. Overall, nonobstructive bowel gas pattern. No focal bowel inflammation. Vascular/Lymphatic: Aorta and main visceral arteries are patent. Mild atherosclerotic disease in abdominal aorta. Negative for an abdominal aortic aneurysm. Central venous catheter enters the right common femoral vein and terminates in the upper IVC just below the inferior cavoatrial junction. No significant lymph node enlargement in the abdomen or pelvis. Reproductive: Status post hysterectomy. No adnexal masses. Other: Negative for ascites.  Negative for free air. Musculoskeletal: Irregularity involving the right iliac crest is similar to the previous examination. Degenerative facet disease at L5-S1. No acute bone abnormality. IMPRESSION: 1. No acute abnormality in the abdomen or pelvis. 2. Extensive postsurgical changes involving the bowel. Scattered areas of fluid-filled bowel but no evidence for obstruction and no focal bowel inflammation. 3. Right femoral central line  that terminates in the upper IVC just below the inferior cavoatrial junction. Electronically Signed   By: Markus Daft M.D.   On: 03/10/2021 11:13    Procedures Procedures   Medications Ordered in ED Medications  0.9 %  sodium chloride infusion ( Intravenous Stopped 03/10/21 1428)  sodium chloride 0.9 % bolus 1,000 mL (0 mLs Intravenous Stopped 03/10/21 1241)  HYDROmorphone (DILAUDID) injection 1 mg (1 mg Intravenous Given 03/10/21 0921)  promethazine (PHENERGAN) 12.5 mg in sodium chloride 0.9 % 50 mL IVPB (0 mg Intravenous Stopped 03/10/21 1040)  HYDROmorphone (DILAUDID) injection 1 mg (1 mg Intravenous Given 03/10/21 1041)  iohexol (OMNIPAQUE) 300 MG/ML solution 100 mL (100 mLs  Intravenous Contrast Given 03/10/21 1042)  HYDROmorphone (DILAUDID) injection 1 mg (1 mg Intravenous Given 03/10/21 1253)    ED Course  I have reviewed the triage vital signs and the nursing notes.  Pertinent labs & imaging results that were available during my care of the patient were reviewed by me and considered in my medical decision making (see chart for details).    MDM Rules/Calculators/A&P                          Work-up here today without any acute findings.  Liver function test without significant abnormalities.  Electrolytes without significant abnormalities.  BUN and creatinine up some 22 and 1.03.  But GFR is greater than 60.  Lipase is not elevated.  No leukocytosis.  Hemoglobin down some at 9.6.  CT scan of the abdomen without any evidence of any acute process.  Patient treated here with several episodes of pain medication.  Patient chronically is on pain medication.  Patient vomiting controlled with Phenergan.  And the pain medication.  Overall patient stable for discharge home.  Patient also received IV fluids  Is possible that patient states that she was out of her pain management pain medicine.  And this part of this could be withdrawal but she denies that.  Patient will make an appointment to follow-up with her gastroenterologist at [facility].  And she stated that she will be getting new pain medication in a couple days.     Final Clinical Impression(s) / ED Diagnoses Final diagnoses:  Generalized abdominal pain    Rx / DC Orders ED Discharge Orders    None       Fredia Sorrow, MD 03/10/21 1704    Fredia Sorrow, MD 03/10/21 1704

## 2021-03-10 NOTE — ED Provider Notes (Signed)
MSE was initiated and I personally evaluated the patient and placed orders (if any) at  11:37 PM on March 10, 2021.  Patient here with abdominal pain.  Seen on 4/13 and earlier today for the same.  Reports worsening pain.  Receives most of her nutrition from TPN due to short gut syndrome.  Had reassuring CT earlier today.  Also has hx of thoracic discitis and is still getting abx for this.   States she is out of her home dilaudid.  The patient appears stable so that the remainder of the MSE may be completed by another provider.   Montine Circle, PA-C 03/10/21 2346    Merryl Hacker, MD 03/11/21 [phone]

## 2021-03-10 NOTE — ED Notes (Signed)
Pt. Placed on cardiac monitoring

## 2021-03-10 NOTE — ED Triage Notes (Signed)
Coming from home, LLQ pain, low back pain, NV, for 4 days, has history of crohn's disease, has tunneled catheter in right leg

## 2021-03-10 NOTE — ED Notes (Signed)
Patient transported to CT 

## 2021-03-10 NOTE — ED Notes (Signed)
Pt c/o nausea.  

## 2021-03-10 NOTE — Discharge Instructions (Addendum)
Work-up for the abdominal pain without any acute findings.  No evidence of any flares to his Crohn's disease.  No evidence of any significant electrolyte abnormalities.  Follow-up with your gastroenterologist at [facility].

## 2021-03-11 ENCOUNTER — Emergency Department (HOSPITAL_COMMUNITY): Payer: Medicare Other

## 2021-03-11 DIAGNOSIS — R569 Unspecified convulsions: Secondary | ICD-10-CM

## 2021-03-11 DIAGNOSIS — F1120 Opioid dependence, uncomplicated: Secondary | ICD-10-CM

## 2021-03-11 DIAGNOSIS — G894 Chronic pain syndrome: Secondary | ICD-10-CM | POA: Diagnosis not present

## 2021-03-11 DIAGNOSIS — N183 Chronic kidney disease, stage 3 unspecified: Secondary | ICD-10-CM

## 2021-03-11 DIAGNOSIS — K50819 Crohn's disease of both small and large intestine with unspecified complications: Secondary | ICD-10-CM

## 2021-03-11 DIAGNOSIS — K529 Noninfective gastroenteritis and colitis, unspecified: Secondary | ICD-10-CM | POA: Diagnosis not present

## 2021-03-11 DIAGNOSIS — R109 Unspecified abdominal pain: Secondary | ICD-10-CM | POA: Diagnosis not present

## 2021-03-11 DIAGNOSIS — R197 Diarrhea, unspecified: Secondary | ICD-10-CM

## 2021-03-11 DIAGNOSIS — I10 Essential (primary) hypertension: Secondary | ICD-10-CM

## 2021-03-11 DIAGNOSIS — R112 Nausea with vomiting, unspecified: Secondary | ICD-10-CM

## 2021-03-11 DIAGNOSIS — K912 Postsurgical malabsorption, not elsewhere classified: Secondary | ICD-10-CM

## 2021-03-11 DIAGNOSIS — M4644 Discitis, unspecified, thoracic region: Secondary | ICD-10-CM

## 2021-03-11 LAB — COMPREHENSIVE METABOLIC PANEL
ALT: 65 U/L — ABNORMAL HIGH (ref 0–44)
AST: 38 U/L (ref 15–41)
Albumin: 3 g/dL — ABNORMAL LOW (ref 3.5–5.0)
Alkaline Phosphatase: 94 U/L (ref 38–126)
Anion gap: 7 (ref 5–15)
BUN: 18 mg/dL (ref 6–20)
CO2: 21 mmol/L — ABNORMAL LOW (ref 22–32)
Calcium: 7.7 mg/dL — ABNORMAL LOW (ref 8.9–10.3)
Chloride: 111 mmol/L (ref 98–111)
Creatinine, Ser: 0.95 mg/dL (ref 0.44–1.00)
GFR, Estimated: >60 mL/min (ref >60)
Glucose, Bld: 109 mg/dL — ABNORMAL HIGH (ref 70–99)
Potassium: 3.9 mmol/L (ref 3.5–5.1)
Sodium: 139 mmol/L (ref 135–145)
Total Bilirubin: 0.5 mg/dL (ref 0.3–1.2)
Total Protein: 7.3 g/dL (ref 6.5–8.1)

## 2021-03-11 LAB — CBC WITH DIFFERENTIAL/PLATELET
Abs Immature Granulocytes: 0.01 10*3/uL (ref 0.00–0.07)
Basophils Absolute: 0 10*3/uL (ref 0.0–0.1)
Basophils Relative: 0 %
Eosinophils Absolute: 0 10*3/uL (ref 0.0–0.5)
Eosinophils Relative: 1 %
HCT: 31.3 % — ABNORMAL LOW (ref 36.0–46.0)
Hemoglobin: 10.3 g/dL — ABNORMAL LOW (ref 12.0–15.0)
Immature Granulocytes: 0 %
Lymphocytes Relative: 31 %
Lymphs Abs: 1.8 10*3/uL (ref 0.7–4.0)
MCH: 29.6 pg (ref 26.0–34.0)
MCHC: 32.9 g/dL (ref 30.0–36.0)
MCV: 89.9 fL (ref 80.0–100.0)
Monocytes Absolute: 0.3 10*3/uL (ref 0.1–1.0)
Monocytes Relative: 5 %
Neutro Abs: 3.7 10*3/uL (ref 1.7–7.7)
Neutrophils Relative %: 63 %
Platelets: 210 10*3/uL (ref 150–400)
RBC: 3.48 MIL/uL — ABNORMAL LOW (ref 3.87–5.11)
RDW: 15.7 % — ABNORMAL HIGH (ref 11.5–15.5)
WBC: 5.8 10*3/uL (ref 4.0–10.5)
nRBC: 0 % (ref 0.0–0.2)

## 2021-03-11 LAB — LIPASE, BLOOD: Lipase: 39 U/L (ref 11–51)

## 2021-03-11 LAB — URINALYSIS, ROUTINE W REFLEX MICROSCOPIC
Bacteria, UA: NONE SEEN
Bilirubin Urine: NEGATIVE
Glucose, UA: NEGATIVE mg/dL
Hgb urine dipstick: NEGATIVE
Ketones, ur: 5 mg/dL — AB
Leukocytes,Ua: NEGATIVE
Nitrite: NEGATIVE
Protein, ur: >=300 mg/dL — AB
Specific Gravity, Urine: 1.023 (ref 1.005–1.030)
pH: 7 (ref 5.0–8.0)

## 2021-03-11 LAB — SARS CORONAVIRUS 2 (TAT 6-24 HRS): SARS Coronavirus 2: NEGATIVE

## 2021-03-11 MED ORDER — ESTRADIOL 1 MG PO TABS
2.0000 mg | ORAL_TABLET | Freq: Every day | ORAL | Status: DC
  Filled 2021-03-11 (×3): qty 2
  Filled 2021-03-11: qty 1

## 2021-03-11 MED ORDER — HYDROMORPHONE HCL 2 MG PO TABS: 4.0000 mg | ORAL_TABLET | ORAL | Status: DC | PRN

## 2021-03-11 MED ORDER — SUCRALFATE 1 GM/10ML PO SUSP
1.0000 g | Freq: Three times a day (TID) | ORAL | Status: DC
  Administered 2021-03-12: 1 g via ORAL
  Filled 2021-03-11 (×3): qty 10

## 2021-03-11 MED ORDER — SODIUM CHLORIDE 0.9 % IV SOLN: INTRAVENOUS | Status: DC

## 2021-03-11 MED ORDER — METOPROLOL TARTRATE 100 MG PO TABS
100.0000 mg | ORAL_TABLET | Freq: Two times a day (BID) | ORAL | Status: DC
  Administered 2021-03-11 – 2021-03-13 (×4): 100 mg via ORAL
  Filled 2021-03-11 (×4): qty 1

## 2021-03-11 MED ORDER — HYDROMORPHONE HCL 1 MG/ML IJ SOLN
1.0000 mg | Freq: Once | INTRAMUSCULAR | Status: AC
  Administered 2021-03-11: 1 mg via INTRAVENOUS
  Filled 2021-03-11: qty 1

## 2021-03-11 MED ORDER — ONDANSETRON HCL 4 MG/2ML IJ SOLN
4.0000 mg | Freq: Once | INTRAMUSCULAR | Status: AC
  Administered 2021-03-11: 4 mg via INTRAVENOUS
  Filled 2021-03-11: qty 2

## 2021-03-11 MED ORDER — SODIUM CHLORIDE 0.9 % IV BOLUS
1000.0000 mL | Freq: Once | INTRAVENOUS | Status: AC
  Administered 2021-03-11: 1000 mL via INTRAVENOUS

## 2021-03-11 MED ORDER — SODIUM CHLORIDE 0.9% FLUSH
10.0000 mL | INTRAVENOUS | Status: DC | PRN
  Administered 2021-03-11: 10 mL

## 2021-03-11 MED ORDER — CALCIUM CARBONATE ANTACID 500 MG PO CHEW: 1.0000 | CHEWABLE_TABLET | Freq: Every day | ORAL | Status: DC

## 2021-03-11 MED ORDER — HYDROMORPHONE HCL 2 MG PO TABS: 4.0000 mg | ORAL_TABLET | Freq: Four times a day (QID) | ORAL | Status: DC | PRN

## 2021-03-11 MED ORDER — BUDESONIDE 3 MG PO CPEP
9.0000 mg | ORAL_CAPSULE | Freq: Every day | ORAL | Status: DC
  Filled 2021-03-11 (×3): qty 3

## 2021-03-11 MED ORDER — HYDRALAZINE HCL 50 MG PO TABS
75.0000 mg | ORAL_TABLET | Freq: Three times a day (TID) | ORAL | Status: DC
  Administered 2021-03-11 – 2021-03-13 (×4): 75 mg via ORAL
  Filled 2021-03-11 (×4): qty 1

## 2021-03-11 MED ORDER — VITAMIN B-12 1000 MCG PO TABS: 1000.0000 ug | ORAL_TABLET | Freq: Every day | ORAL | Status: DC

## 2021-03-11 MED ORDER — VITAMIN D 25 MCG (1000 UNIT) PO TABS: 1000.0000 [IU] | ORAL_TABLET | Freq: Every day | ORAL | Status: DC

## 2021-03-11 MED ORDER — SODIUM CHLORIDE 0.9 % IV SOLN
25.0000 mg | Freq: Four times a day (QID) | INTRAVENOUS | Status: DC | PRN
  Administered 2021-03-11 – 2021-03-12 (×3): 25 mg via INTRAVENOUS
  Filled 2021-03-11 (×3): qty 1

## 2021-03-11 MED ORDER — METHADONE HCL 5 MG PO TABS: 5.0000 mg | ORAL_TABLET | Freq: Every day | ORAL | Status: DC

## 2021-03-11 MED ORDER — TEDUGLUTIDE (RDNA) 5 MG ~~LOC~~ KIT: 5.0000 mL | PACK | Freq: Every day | SUBCUTANEOUS | Status: DC

## 2021-03-11 MED ORDER — CHLORHEXIDINE GLUCONATE CLOTH 2 % EX PADS
6.0000 | MEDICATED_PAD | Freq: Every day | CUTANEOUS | Status: DC
  Administered 2021-03-12 – 2021-03-13 (×2): 6 via TOPICAL

## 2021-03-11 MED ORDER — ACETAMINOPHEN 325 MG PO TABS: 650.0000 mg | ORAL_TABLET | Freq: Four times a day (QID) | ORAL | Status: DC | PRN

## 2021-03-11 MED ORDER — POLYVINYL ALCOHOL 1.4 % OP SOLN
1.0000 [drp] | Freq: Four times a day (QID) | OPHTHALMIC | Status: DC
  Administered 2021-03-11 – 2021-03-13 (×6): 1 [drp] via OPHTHALMIC
  Filled 2021-03-11 (×2): qty 15

## 2021-03-11 MED ORDER — PANCRELIPASE (LIP-PROT-AMYL) 36000-114000 UNITS PO CPEP
36000.0000 [IU] | ORAL_CAPSULE | Freq: Three times a day (TID) | ORAL | Status: DC | PRN
  Administered 2021-03-13: 36000 [IU] via ORAL
  Filled 2021-03-11 (×2): qty 1

## 2021-03-11 MED ORDER — PROMETHAZINE HCL 25 MG PO TABS
25.0000 mg | ORAL_TABLET | Freq: Four times a day (QID) | ORAL | Status: DC | PRN
  Administered 2021-03-11: 25 mg via ORAL
  Filled 2021-03-11: qty 1

## 2021-03-11 MED ORDER — CYCLOSPORINE 0.05 % OP EMUL
1.0000 [drp] | Freq: Two times a day (BID) | OPHTHALMIC | Status: DC
  Administered 2021-03-11 – 2021-03-13 (×4): 1 [drp] via OPHTHALMIC
  Filled 2021-03-11 (×6): qty 1

## 2021-03-11 MED ORDER — SODIUM CHLORIDE 0.9 % IV SOLN
2.0000 g | Freq: Three times a day (TID) | INTRAVENOUS | Status: DC
  Administered 2021-03-12 – 2021-03-13 (×4): 2 g via INTRAVENOUS
  Filled 2021-03-11 (×4): qty 2

## 2021-03-11 MED ORDER — AMLODIPINE BESYLATE 10 MG PO TABS
10.0000 mg | ORAL_TABLET | Freq: Every day | ORAL | Status: DC
  Administered 2021-03-12 – 2021-03-13 (×2): 10 mg via ORAL
  Filled 2021-03-11 (×2): qty 1

## 2021-03-11 MED ORDER — HYDROMORPHONE HCL 1 MG/ML IJ SOLN: 1.0000 mg | INTRAMUSCULAR | Status: DC | PRN

## 2021-03-11 MED ORDER — ACETAMINOPHEN 650 MG RE SUPP: 650.0000 mg | Freq: Four times a day (QID) | RECTAL | Status: DC | PRN

## 2021-03-11 MED ORDER — ENOXAPARIN SODIUM 40 MG/0.4ML ~~LOC~~ SOLN: 40.0000 mg | SUBCUTANEOUS | Status: DC

## 2021-03-11 MED ORDER — DULOXETINE HCL 60 MG PO CPEP
90.0000 mg | ORAL_CAPSULE | Freq: Every day | ORAL | Status: DC
  Filled 2021-03-11: qty 1

## 2021-03-11 MED ORDER — BUPROPION HCL ER (SR) 100 MG PO TB12
200.0000 mg | ORAL_TABLET | Freq: Every day | ORAL | Status: DC
  Filled 2021-03-11 (×3): qty 2

## 2021-03-11 MED ORDER — HYDROMORPHONE HCL 1 MG/ML IJ SOLN
1.0000 mg | INTRAMUSCULAR | Status: DC | PRN
  Administered 2021-03-11 – 2021-03-13 (×11): 1 mg via INTRAVENOUS
  Filled 2021-03-11 (×11): qty 1

## 2021-03-11 MED ORDER — LEVETIRACETAM IN NACL 500 MG/100ML IV SOLN
500.0000 mg | Freq: Two times a day (BID) | INTRAVENOUS | Status: DC
  Administered 2021-03-12 – 2021-03-13 (×3): 500 mg via INTRAVENOUS
  Filled 2021-03-11 (×4): qty 100

## 2021-03-11 MED ORDER — ENOXAPARIN SODIUM 40 MG/0.4ML ~~LOC~~ SOLN
40.0000 mg | SUBCUTANEOUS | Status: DC
  Administered 2021-03-12: 40 mg via SUBCUTANEOUS
  Filled 2021-03-11: qty 0.4

## 2021-03-11 MED ORDER — METHADONE HCL 10 MG PO TABS
5.0000 mg | ORAL_TABLET | Freq: Four times a day (QID) | ORAL | Status: DC
  Administered 2021-03-12 – 2021-03-13 (×7): 5 mg via ORAL
  Filled 2021-03-11 (×7): qty 1

## 2021-03-11 MED ORDER — SODIUM CHLORIDE 0.9 % IV SOLN
600.0000 mg | Freq: Every day | INTRAVENOUS | Status: DC
  Administered 2021-03-11 – 2021-03-12 (×2): 600 mg via INTRAVENOUS
  Filled 2021-03-11 (×3): qty 12

## 2021-03-11 NOTE — ED Notes (Addendum)
Pt experienced emesis and diarrhea. Cleaned and given new emesis bag.

## 2021-03-11 NOTE — ED Provider Notes (Signed)
[facility] EMERGENCY DEPARTMENT Provider Note   CSN: [id number] Arrival date & time: 03/10/21  2308     History Chief Complaint  Patient presents with  . Gen. Body Aches    Caitlin Peters is a 60 y.o. female.  The history is provided by the patient and medical records. No language interpreter was used.  Abdominal Pain Pain location:  Generalized (left side worse) Pain quality: aching, cramping and sharp   Pain radiates to:  Does not radiate Pain severity:  Severe Onset quality:  Gradual Duration:  1 day Timing:  Constant Progression:  Waxing and waning Chronicity:  Chronic Context: not trauma   Relieved by:  Nothing Worsened by:  Nothing Ineffective treatments:  None tried Associated symptoms: chills, diarrhea, fatigue, nausea and vomiting   Associated symptoms: no chest pain, no constipation, no cough, no dysuria, no fever and no shortness of breath        Past Medical History:  Diagnosis Date  . Acute pancreatitis 04/13/2020  . Anasarca 10/2019  . AVN (avascular necrosis of bone) ([place])   . Cataract   . Choledocholithiasis (sludge) s/p ERCP 10/2019 10/21/2020  . Chronic pain syndrome   . CKD (chronic kidney disease), stage III ([place])   . Crohn disease ([place])   . Crohn's disease of small & large intestine with SGS 1984   Caitlin Peters is a 60year old female with a history of Crohn's disease diagnosed in 65 (age 44), history of long-term steroid use with osteoporosis, S/P multiple bowel resections (4098-1191) complicated by chronic back and abdominal pain, steatorrhea and short bowel syndrome. The patient has been left with ~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 attached to proximal transverse colon through rectum. She has been  . Depression   . Diverticulosis   . GERD (gastroesophageal reflux disease)   . HTN (hypertension)   . IDA (iron deficiency anemia)   . Malnutrition ([place])   . Mass in chest   . Osteoporosis   . Osteoporosis 12/24/2014  .  Pancreatitis   . 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 Vitamin B12 deficiency     Patient Active Problem List   Diagnosis Date Noted  . Thoracic discitis 02/10/2021  . Pressure injury of skin 12/18/2020  . Bacteremia 12/17/2020  . Seizures ([place]) 12/17/2020  . Drug-seeking behavior 12/16/2020  . Seizure ([place]) 12/15/2020  . CKD (chronic kidney disease) stage 3, GFR 30-59 ml/min ([name]) 12/06/2020  . COVID-19 virus infection 12/06/2020  . Protein calorie malnutrition ([place]) 12/06/2020  . Palpitations 11/22/2020  . PAC (premature atrial contraction) 11/22/2020  . History of central line-associated bloodstream infection (CLABSI) 10/21/2020  . Nausea & vomiting 10/21/2020  . Choledocholithiasis (sludge) s/p ERCP 10/2019 10/21/2020  . 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 Abdominal pain 09/07/2020  . Acute metabolic encephalopathy   . Hypomagnesemia   . Partial small bowel obstruction ([place])   . Bacteremia associated with intravascular line ([place]) 08/04/2020  . Enterobacter sepsis ([place]) 07/22/2020  . Anxiety 06/03/2020  . Acute pancreatitis 04/13/2020  . Infection due to Acinetobacter species 03/10/2020  . Pancytopenia ([place]) 03/05/2020  . Central line infection   . Elevated liver enzymes 01/02/2020  . Cholangitis   . Anasarca 10/28/2019  . Acute kidney injury superimposed  on chronic kidney disease ([place]) 10/28/2019  . Falls 10/28/2019  . Malnutrition ([place])   . Vitamin B12 deficiency   . GERD (gastroesophageal reflux disease)   . Chronic pain syndrome   . Hypokalemia due to excessive gastrointestinal loss of potassium 10/13/2019  . Fever   . Hypokalemia 10/12/2019  . Chronic diarrhea 10/12/2019  . Dysuria 10/12/2019  . Bilateral  lower extremity edema 10/12/2019  . AKI (acute kidney injury) ([place]) 10/12/2019  . Fungemia 08/27/2019  . IDA (iron deficiency anemia) 11/03/2018  . Dilation of biliary tract 08/28/2018  . Severe diarrhea 03/07/2018  . LFTs abnormal 01/27/2018  . GI tract obstruction ([place]) 01/27/2018  . Gram-negative bacteremia 09/13/2017  . HTN (hypertension), benign 12/02/2016  . Intractable pain 12/02/2016  . Anemia 12/02/2016  . 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 Avascular necrosis ([place]) 06/14/2015  . Polyarthralgia 05/03/2015  . On total parenteral nutrition (TPN) 12/30/2014  . Osteoporosis 12/24/2014  . Low back pain 12/16/2014  . Vitamin D deficiency 12/16/2014  . 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 History of colonic diverticulitis 2014  . Depression 07/24/2012  . Gram-positive bacteremia 07/24/2012  . Small bowel obstruction due to adhesions ([place]) 07/24/2012  . Diarrhea 12/13/2011  . Neuralgia and neuritis 06/01/2011  . Myalgia and myositis 08/12/2003  . Crohn's disease of small & large intestine with SGS 1984    Past Surgical History:  Procedure Laterality Date  . ABDOMINAL ADHESION SURGERY  01/22/2018  . APPENDECTOMY  1989  . BILIARY DILATION  11/26/2019   Procedure: BILIARY DILATION;  Surgeon: Jackquline Denmark, MD;  Location: [facility];  Service: Endoscopy;;  . BILIARY DILATION  03/08/2020   Procedure: BILIARY DILATION;  Surgeon: Irving Copas., MD;  Location: [place];  Service: Gastroenterology;;  . BIOPSY  03/08/2020   Procedure: BIOPSY;  Surgeon: Irving Copas., MD;  Location: [place];  Service: Gastroenterology;;  . CHEST WALL RESECTION     right thoracotomy,resection of chest mass with anterior rib and reconstruction using prosthetic mesh and video arthroscopy  . CHOLECYSTECTOMY  01/22/2018  . COLONOSCOPY  2019  . ENTEROSTOMY CLOSURE  04/1999  . ERCP N/A 11/26/2019   Procedure: ENDOSCOPIC RETROGRADE CHOLANGIOPANCREATOGRAPHY (ERCP);  Surgeon: Jackquline Denmark, MD;  Location: [facility];  Service: Endoscopy;  Laterality: N/A;  . ERCP N/A 03/08/2020   Procedure: ENDOSCOPIC RETROGRADE CHOLANGIOPANCREATOGRAPHY (ERCP);  Surgeon: Irving Copas., MD;  Location: [place];  Service: Gastroenterology;  Laterality: N/A;  . ESOPHAGOGASTRODUODENOSCOPY N/A 03/08/2020   Procedure: ESOPHAGOGASTRODUODENOSCOPY (EGD);  Surgeon: Irving Copas., MD;  Location: [place];  Service: Gastroenterology;  Laterality: N/A;  . EUS N/A 03/08/2020   Procedure: UPPER ENDOSCOPIC ULTRASOUND (EUS) LINEAR;  Surgeon: Irving Copas., MD;  Location: [place];  Service: Gastroenterology;  Laterality: N/A;  . ILEOCECETOMY  03/1999   ileocolon resection with abdominal stoma  . ILEOSTOMY CLOSURE  2001  . IR FLUORO GUIDE CV LINE LEFT  01/07/2020  . IR FLUORO GUIDE CV LINE LEFT  03/09/2020  . IR FLUORO GUIDE CV LINE LEFT  05/09/2020  . IR FLUORO GUIDE CV LINE LEFT  07/20/2020  . IR FLUORO GUIDE CV LINE RIGHT  08/05/2020  . IR PTA VENOUS EXCEPT DIALYSIS CIRCUIT  01/07/2020  . IR REMOVAL TUN CV CATH W/O FL  08/05/2020  . IR US GUIDE VASC ACCESS LEFT     x 2 06/17/19 and 09/14/2019  . IR US GUIDE VASC ACCESS RIGHT  08/05/2020  . KNEE SURGERY     right  knee   . LAPAROSCOPIC SMALL BOWEL RESECTION  2009   2000-2009.  SB resections for Crohns Disease - now with Short gut  . OMENTECTOMY  01/22/2018  . PARTIAL HYSTERECTOMY  1984   with LSO  . REMOVAL OF STONES  11/26/2019   Procedure: REMOVAL OF STONES;  Surgeon: Jackquline Denmark, MD;  Location: [facility];  Service: Endoscopy;;  . REMOVAL OF STONES  03/08/2020   Procedure: REMOVAL OF STONES;  Surgeon: Irving Copas., MD;  Location: [place];  Service: Gastroenterology;;  . SALPINGOOPHORECTOMY Left 1984  . SALPINGOOPHORECTOMY Right 1990  . SERIAL TRANSVERSE ENTEROPLASTY (STEP) - SMALL BOWEL LENGTHENING  01/22/2018   Dr Alene Mires, [place] length from 120 to 165cm   . SMALL INTESTINE  SURGERY  2002  . SMALL INTESTINE SURGERY  2003  . SPHINCTEROTOMY  11/26/2019   Procedure: SPHINCTEROTOMY;  Surgeon: Jackquline Denmark, MD;  Location[facility];  Service: Endoscopy;;  . TOTAL ABDOMINAL HYSTERECTOMY  1990   with RSO  . UPPER GASTROINTESTINAL ENDOSCOPY       OB History   No obstetric history on file.     Family History  Problem Relation Age of Onset  . Breast cancer Sister   . Multiple sclerosis Sister   . Diabetes Sister   . Lupus Sister   . Colon cancer Other   . Crohn's disease Other   . Seizures Mother   . Glaucoma Mother   . CAD Father   . Heart disease Father   . Hypertension Father     Social History   Tobacco Use  . Smoking status: Former Research scientist (life sciences)  . Smokeless tobacco: Never Used  Vaping Use  . Vaping Use: Never used  Substance Use Topics  . Alcohol use: Not Currently  . Drug use: Never    Home Medications Prior to Admission medications   Medication Sig Start Date End Date Taking? Authorizing Provider  acetaminophen (TYLENOL) 325 MG tablet Take 650 mg by mouth every 6 (six) hours as needed for mild pain.     [provider]  amLODipine (NORVASC) 10 MG tablet Take 1 tablet (10 mg total) by mouth daily. 12/07/20 01/06/21  Erline Hau, MD  budesonide (ENTOCORT EC) 3 MG 24 hr capsule Take 3 capsules (9 mg total) by mouth daily. Patient taking differently: Take 3 mg by mouth every other day. 03/06/21   [name], [name], MD  buPROPion [facility] SR) 100 MG 12 hr tablet Take 200 mg by mouth daily. 03/02/21   [provider]  Calcium 200 MG TABS Take 200 mg by mouth daily.    [provider]  ceFEPime (MAXIPIME) IVPB Inject 2 g into the vein every 12 (twelve) hours. Indication:  Discitis/osteomyelitis First Dose: Yes Last Day of Therapy:  03/24/2021 Labs - Once weekly:  CBC/D and BMP, Labs - Every other week:  ESR and CRP Method of administration: IV Push Method of administration may be changed at the  discretion of home infusion pharmacist based upon assessment of the patient and/or caregiver's ability to self-administer the medication ordered. 02/15/21 03/26/21  Pokhrel, Corrie Mckusick, MD  cholecalciferol (VITAMIN D3) 25 MCG (1000 UT) tablet Take 1,000 Units by mouth daily.     [provider]  cycloSPORINE (RESTASIS) 0.05 % ophthalmic emulsion Place 1 drop into both eyes 2 (two) times daily.    [provider]  daptomycin (CUBICIN) IVPB Inject 600 mg into the vein daily. Indication: discitis/osteomyelitis First Dose: Yes Last  Day of Therapy:  03/24/2021 Labs - Once weekly:  CBC/D, BMP, and CPK Labs - Every other week:  ESR and CRP Method of administration: IV Push Method of administration may be changed at the discretion of home infusion pharmacist based upon assessment of the patient and/or caregiver's ability to self-administer the medication ordered. Patient taking differently: Inject 600 mg into the vein daily. Indication: discitis/osteomyelitis First Dose: Yes Last Day of Therapy:  03/24/2021 Labs - Once weekly:  CBC/D, BMP, and CPK Labs - Every other week:  ESR and CRP Method of administration: IV Push Method of administration may be changed at the discretion of home infusion pharmacist based upon assessment of the patient and/or caregiver's ability to self-administer the medication ordered. 02/15/21 03/26/21  Pokhrel, Corrie Mckusick, MD  dexlansoprazole (DEXILANT) 60 MG capsule Take 1 capsule (60 mg total) by mouth daily. 12/28/20   [name], [name], MD  Dextran 70-Hypromellose 0.1-0.3 % SOLN Place 1 drop into both eyes 4 (four) times daily.    [provider]  diphenoxylate-atropine (LOMOTIL) 2.5-0.025 MG tablet TAKE 1 TABLET BY MOUTH 4 TIMES DAILY AS NEEDED FOR DIARRHEA OR  LOOSE  STOOLS Patient taking differently: Take 1-2 tablets by mouth 4 (four) times daily as needed for diarrhea or loose stools. 12/28/20   [name], [name], MD  DULoxetine (CYMBALTA) 30 MG  capsule Take 3 capsules (90 mg total) by mouth daily. 12/07/20   [name], [name], MD  estradiol (ESTRACE) 2 MG tablet Take 1 tablet (2 mg total) by mouth daily. 12/07/20   Erline Hau, MD  GATTEX 5 MG KIT Inject 5 mLs into the skin daily. 01/03/21   [provider]  hydrALAZINE (APRESOLINE) 50 MG tablet Take 1.5 tablets (75 mg total) by mouth 3 (three) times daily. 11/22/20 02/20/21  Rudean Haskell A, MD  HYDROmorphone (DILAUDID) 4 MG tablet Take 4 mg by mouth 4 (four) times daily as needed for moderate pain.  09/15/20   [provider]  levETIRAcetam (KEPPRA) 500 MG tablet Take 1 tablet (500 mg total) by mouth 2 (two) times daily. 02/24/21 03/26/21  Cameron Sprang, MD  lipase/protease/amylase (CREON) 36000 UNITS CPEP capsule Take 1 capsule (36,000 Units total) by mouth 3 (three) times daily as needed (with meals for digestion). 06/28/20   [name], [name], MD  loperamide (IMODIUM) 2 MG capsule TAKE 1 CAPSULE BY MOUTH AS NEEDED FOR DIARRHEA OR LOOSE STOOLS Patient not taking: No sig reported 01/20/21   [name], [name], MD  methadone (DOLOPHINE) 5 MG tablet Take 1 tablet (5 mg total) by mouth 5 (five) times daily. 06/30/20   Florencia Reasons, MD  metoprolol tartrate (LOPRESSOR) 100 MG tablet Take 1 tablet (100 mg total) by mouth 2 (two) times daily. 02/21/21   [name], [name], MD  Multiple Vitamins-Minerals (MULTIVITAMIN ADULT PO) Take 1 tablet by mouth daily.    [provider]  NARCAN 4 MG/0.1ML LIQD nasal spray kit Place 1 spray into the nose once as needed (overdose). 10/14/20   [provider]  PRESCRIPTION MEDICATION Inject 1 each into the vein daily. Home TPN . [facility] . 1 bag for 12 hours. [phone]    [provider]  Probiotic Product (PROBIOTIC-10 PO) Take 1 capsule by mouth daily.     [provider]  promethazine (PHENERGAN) 25 MG tablet Take 1 tablet (25 mg  total) by mouth every 6 (six) hours as needed for  nausea or vomiting. 02/21/21   [name], [name], MD  sodium chloride 0.9 % infusion Inject 1 mL into the vein daily as needed (flush). 10/16/20   [provider]  sucralfate (CARAFATE) 1 GM/10ML suspension Take 10 mLs (1 g total) by mouth 4 (four) times daily -  with meals and at bedtime. 06/28/20   [name], [name], MD  Trace Minerals Cu-Mn-Se-Zn (TRALEMENT IV) Inject 1 mL into the vein See admin instructions. Used in TPN bag 4 times weekly    [provider]  vitamin B-12 (CYANOCOBALAMIN) 100 MCG tablet Take 1,000 mcg by mouth daily.     [provider]    Allergies    Meperidine, Hyoscyamine, Cefepime, Gabapentin, Lyrica [pregabalin], Topamax [topiramate], Zosyn [piperacillin sod-tazobactam so], Fentanyl, and Morphine and related  Review of Systems   Review of Systems  Constitutional: Positive for chills and fatigue. Negative for fever.  HENT: Negative for congestion.   Respiratory: Negative for cough, chest tightness, shortness of breath and wheezing.   Cardiovascular: Negative for chest pain.  Gastrointestinal: Positive for abdominal pain, diarrhea, nausea and vomiting. Negative for abdominal distention and constipation.  Genitourinary: Negative for dysuria, flank pain and frequency.  Musculoskeletal: Negative for back pain, neck pain and neck stiffness.  Skin: Negative for rash and wound.  Neurological: Negative for light-headedness and headaches.  Psychiatric/Behavioral: Negative for agitation and confusion.  All other systems reviewed and are negative.   Physical Exam Updated Vital Signs BP (!) 176/88   Pulse 62   Temp 98.4 F (36.9 C) (Oral)   Resp (!) 23   Ht 5' 7"  (1.702 m)   Wt 62 kg   SpO2 100%   BMI 21.41 kg/m   Physical Exam Vitals and nursing note reviewed.  Constitutional:      General: She is not in acute distress.    Appearance: She is well-developed. She is  ill-appearing. She is not toxic-appearing or diaphoretic.  HENT:     Head: Normocephalic and atraumatic.     Nose: No congestion or rhinorrhea.     Mouth/Throat:     Mouth: Mucous membranes are dry.     Pharynx: No oropharyngeal exudate or posterior oropharyngeal erythema.  Eyes:     Extraocular Movements: Extraocular movements intact.     Conjunctiva/sclera: Conjunctivae normal.     Pupils: Pupils are equal, round, and reactive to light.  Cardiovascular:     Rate and Rhythm: Normal rate and regular rhythm.     Heart sounds: No murmur heard.   Pulmonary:     Effort: Pulmonary effort is normal. No respiratory distress.     Breath sounds: Normal breath sounds. No wheezing, rhonchi or rales.  Chest:     Chest wall: No tenderness.  Abdominal:     General: Abdomen is flat.     Palpations: Abdomen is soft.     Tenderness: There is abdominal tenderness. There is no right CVA tenderness, left CVA tenderness, guarding or rebound.  Musculoskeletal:        General: No tenderness.     Cervical back: Neck supple. No tenderness.     Right lower leg: No edema.     Left lower leg: No edema.  Skin:    General: Skin is warm and dry.     Capillary Refill: Capillary refill takes less than 2 seconds.     Findings: No erythema.  Neurological:     General: No focal deficit present.     Mental Status:  She is alert.     Sensory: No sensory deficit.     Motor: No weakness.  Psychiatric:        Mood and Affect: Mood is anxious.     ED Results / Procedures / Treatments   Labs (all labs ordered are listed, but only abnormal results are displayed) Labs Reviewed  CBC WITH DIFFERENTIAL/PLATELET - Abnormal; Notable for the following components:      Result Value   RBC 3.48 (*)    Hemoglobin 10.3 (*)    HCT 31.3 (*)    RDW 15.7 (*)    All other components within normal limits  COMPREHENSIVE METABOLIC PANEL - Abnormal; Notable for the following components:   CO2 21 (*)    Glucose, Bld 109 (*)     Calcium 7.7 (*)    Albumin 3.0 (*)    ALT 65 (*)    All other components within normal limits  URINALYSIS, ROUTINE W REFLEX MICROSCOPIC - Abnormal; Notable for the following components:   Ketones, ur 5 (*)    Protein, ur >=300 (*)    All other components within normal limits  URINE CULTURE  LIPASE, BLOOD    EKG None  Radiology DG Abdomen 1 View  Result Date: 03/10/2021 CLINICAL DATA:  60 year old female with right femoral catheter, tunneled catheter in right leg. Crohn disease. EXAM: ABDOMEN - 1 VIEW COMPARISON:  Abdominal radiographs 03/08/2021 and earlier. FINDINGS: Portable AP supine view at at 0939 hours. Right femoral approach vascular catheter has stable configuration in the right inguinal region from 2 days ago. It tracks cephalad, with the tip at the level of the hepatic IVC. No kinking or discontinuity identified. Non obstructed bowel gas pattern. Multiple bowel staple lines in the bilateral abdomen. Stable visualized osseous structures. Levoconvex lumbar scoliosis. IMPRESSION: 1. Right femoral approach vascular catheter terminates at the hepatic IVC level. No adverse features identified. 2. Postoperative changes to the abdomen. Nonobstructed bowel-gas pattern. Electronically Signed   By: Genevie Ann M.D.   On: 03/10/2021 10:10   CT Abdomen Pelvis W Contrast  Result Date: 03/10/2021 CLINICAL DATA:  60 year old with nausea, vomiting and abdominal pain. History of Crohn's disease. EXAM: CT ABDOMEN AND PELVIS WITH CONTRAST TECHNIQUE: Multidetector CT imaging of the abdomen and pelvis was performed using the standard protocol following bolus administration of intravenous contrast. CONTRAST:  168m OMNIPAQUE IOHEXOL 300 MG/ML  SOLN COMPARISON:  02/09/2021 FINDINGS: Lower chest: Lung bases are clear.  No pleural effusions. Hepatobiliary: Normal appearance of the liver. The gallbladder is absent. Main portal venous system is patent. Extrahepatic bile duct measures roughly 6 mm. No significant  biliary dilatation. Pancreas: Mild pancreatic duct dilatation is stable. No peripancreatic inflammation. Spleen: Normal in size without focal abnormality. Adrenals/Urinary Tract: Normal adrenal glands. Normal appearance of the kidneys. No hydronephrosis. No suspicious renal lesions. Moderate distention of the urinary bladder. Stomach/Bowel: Multiple surgical changes throughout the bowel. Scattered fluid-filled loops of bowel, largest in the left lateral abdomen. Overall, nonobstructive bowel gas pattern. No focal bowel inflammation. Vascular/Lymphatic: Aorta and main visceral arteries are patent. Mild atherosclerotic disease in abdominal aorta. Negative for an abdominal aortic aneurysm. Central venous catheter enters the right common femoral vein and terminates in the upper IVC just below the inferior cavoatrial junction. No significant lymph node enlargement in the abdomen or pelvis. Reproductive: Status post hysterectomy. No adnexal masses. Other: Negative for ascites.  Negative for free air. Musculoskeletal: Irregularity involving the right iliac crest is similar to the previous examination. Degenerative facet  disease at L5-S1. No acute bone abnormality. IMPRESSION: 1. No acute abnormality in the abdomen or pelvis. 2. Extensive postsurgical changes involving the bowel. Scattered areas of fluid-filled bowel but no evidence for obstruction and no focal bowel inflammation. 3. Right femoral central line that terminates in the upper IVC just below the inferior cavoatrial junction. Electronically Signed   By: Markus Daft M.D.   On: 03/10/2021 11:13    Procedures Procedures   Medications Ordered in ED Medications  promethazine (PHENERGAN) 25 mg in sodium chloride 0.9 % 50 mL IVPB (0 mg Intravenous Stopped 03/11/21 1007)  acetaminophen (TYLENOL) tablet 650 mg (650 mg Oral Given 03/10/21 2333)  HYDROmorphone (DILAUDID) injection 1 mg (1 mg Intravenous Given 03/11/21 0830)  sodium chloride 0.9 % bolus 1,000 mL  (1,000 mLs Intravenous Bolus 03/11/21 0830)  ondansetron (ZOFRAN) injection 4 mg (4 mg Intravenous Given 03/11/21 1111)  HYDROmorphone (DILAUDID) injection 1 mg (1 mg Intravenous Given 03/11/21 1113)  ondansetron (ZOFRAN) injection 4 mg (4 mg Intravenous Given 03/11/21 1400)  HYDROmorphone (DILAUDID) injection 1 mg (1 mg Intravenous Given 03/11/21 1402)    ED Course  I have reviewed the triage vital signs and the nursing notes.  Pertinent labs & imaging results that were available during my care of the patient were reviewed by me and considered in my medical decision making (see chart for details).    MDM Rules/Calculators/A&P                          MARVIA TROOST is a 60 y.o. female with a complicated past medical history including Crohn's disease with numerous abdominal surgeries, subsequent short gut syndrome, TPN, CKD, chronic pain, hypertension, GERD, prior pancreatitis, prior bowel obstruction, and currently on IV antibiotics for reported discitis who presents with nausea, vomiting, abdominal pain, diarrhea, and intolerance of p.o.  Patient reports that she was seen yesterday for uncontrolled abdominal pain with nausea, vomiting and diarrhea.  She reports that she had work-up including CT scan and labs and after feeling somewhat better was able to go home.  She reports that after returning home, she had recurrence of the pain and has been vomiting nonstop since.  She reports he is feeling more dehydrated and feels that she has failed management at home of her chronic GI symptoms.  She reports that she is not tolerating any p.o. and is not able to keep her medicines down.  Due to this, she has reportedly felt more more dehydrated and abdominal pain was uncontrolled at 10 out of 10.  Patient reports that she was told to come back if her symptoms worsened and she was unable to tolerate PO.   On exam, patient has diffuse abdominal tenderness but worse in the left abdomen.  Bowel sounds were  appreciated.  Lungs clear and chest nontender.  Patient screaming out in pain constantly.  Patient moving all extremities.  Patient does have dry mucous membranes on exam.  Clinically I do suspect patient is having continued acute on chronic nausea, vomiting, and abdominal pain related to all her previous surgeries, Crohn's, and chronic pain problems.  Patient returns because she is not tolerating p.o. and getting more dehydrated at home despite using home medications.  We will try to give patient some pain medicine, nausea medicine, and fluids rehydrate her and check some screening labs to look for acute worsening of electrolytes that might need repletion.  We will check urinalysis as well.  As she just  had CT scan yesterday and the pain is very similar, patient agrees to hold on repeat imaging as I have a low suspicion that she developed a new obstruction or new Crohn's since that time.  She is having diarrhea continuously as well.  Anticipate follow-up on these results.  Patient is still not tolerating p.o. despite medications, as this is a second visit for the same thing and she did not tolerate outpatient management at this time, patient may require admission for further symptom control and rehydration.  2:57 PM Work-up is returned and shows no evidence of urinary tract infection.  Similar anemia to prior.  No leukocytosis.  Metabolic panel appears similar with hypocalcemia.  Lipase not elevated.  On reassessment, she still having severe abdominal pain, nausea, and not tolerating p.o.  As this is a repeat visit and was told to return if she could not maintain hydration, I am concerned about sending her home.  We do not see any need for acute electrolyte repletion at this time and no evidence of acute infection needing antibiotics, however I do not feel she is safe to go home unable to maintain hydration.  Will call for admission for likely observation admission for symptom management and  rehydration.     Final Clinical Impression(s) / ED Diagnoses Final diagnoses:  Nausea vomiting and diarrhea  Generalized abdominal pain    Rx / DC Orders ED Discharge Orders    None      Clinical Impression: 1. Nausea vomiting and diarrhea   2. Generalized abdominal pain     Disposition: Admit  This note was prepared with assistance of Dragon voice recognition software. Occasional wrong-word or sound-a-like substitutions may have occurred due to the inherent limitations of voice recognition software.     Ellenor Wisniewski, Gwenyth Allegra, MD 03/11/21 [id number]

## 2021-03-11 NOTE — ED Notes (Signed)
Pt transported to Xray. 

## 2021-03-11 NOTE — Progress Notes (Signed)
Pharmacy Antibiotic Note  Caitlin Peters is a 60 y.o. 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  Pharmacy has been consulted for daptomycin and cefepime dosing.  Plan: Cefepime 2g IV every 8 hours Daptomycin 600 mg IV every 24 hours Monitor renal function, CPK,  ID recs   Height: 5' 7"  (170.2 cm) Weight: 62 kg (136 lb 11 oz) IBW/kg (Calculated) : 61.6  Temp (24hrs), Avg:98.4 F (36.9 C), Min:98.4 F (36.9 C), Max:98.4 F (36.9 C)  Recent Labs  Lab 03/08/21 1400 03/10/21 0944 03/11/21 0005  WBC 4.5 4.3 5.8  CREATININE 1.05* 1.03* 0.95    Estimated Creatinine Clearance: 62 mL/min (by C-G formula based on SCr of 0.95 mg/dL).    Allergies  Allergen Reactions  . Meperidine Hives    Other reaction(s): GI Upset Due to [place]   . Hyoscyamine Hives and Swelling    Legs swelling   Disorientation  . Cefepime Other (See Comments)    Neurotoxicity occurring in setting of AKI. Ceftriaxone tolerated during same admit  . Gabapentin Other (See Comments)    unknown  . Lyrica [Pregabalin] Other (See Comments)    unknown  . Topamax [Topiramate] Other (See Comments)    unknown  . Zosyn [Piperacillin Sod-Tazobactam So]     Patient reports it makes her vomit, her neck stiff, and her "heart feel funny"  . Fentanyl Rash    Pt is allergic to fentanyl patch related to the glue (gives her a rash) Pt states she is NOT allergic to fentanyl IV medicine  . Morphine And Related Rash    Bertis Ruddy, PharmD Clinical Pharmacist ED Pharmacist Phone # [phone] 03/11/2021 6:17 PM

## 2021-03-11 NOTE — H&P (Signed)
History and Physical    Caitlin Peters:[id number] DOB: 1961/09/15 DOA: 03/10/2021  PCP: [name], [name], MD Patient coming from: home  Chief Complaint: Intractable nausea, vomiting and abdominal pain.  HPI: Caitlin Peters is a 60 y.o. 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  Patient was recently hospitalized between 3/17 and 02/15/2021.  She was treated for T2-T5 discitis and osteomyelitis and discharged on home cefepime IV and daptomycin IV, and last day of both antibiotic therapy will be on 03/24/2021.  Patient reports that she was compliant with her antibiotics treatment, and continues to give her TPN and lipid infusion at home.   Patient reports chronic abdominal pain and chronic diarrhea.  She states that she developed nausea and vomiting along with worsening abdominal pain 2-3 days ago.  Her chronic diarrhea became more loose per patient.  She states that she took her home methadone and Dilaudid pills but threw them up due to intractable nausea and vomiting. She then had to take additional doses so she" ran out of her methadone and dilaudid pills". Patient was evaluated at [facility] ED on 4/13 and 4/15, and was discharged home after unremarkable work-up.  CT abdomen/pelvis on 4/15 showed "No acute abnormality in the abdomen or pelvis". Patient reports that she continued to have intractable nausea and vomiting, and came back to the ED today.  In the emergency room, she was afebrile with pulse 62, RR 16, BP 153/93 and room air O2 sats 100%.  Labs showed nonrevealing CMP and CBC, unremarkable UA. KUB showed No evidence of bowel obstruction or acute process.   Review of Systems: As per HPI  otherwise 10 point review of systems negative.  Review of Systems Otherwise negative except as per HPI, including: General: Denies fever, chills, night sweats or unintended weight loss. Resp: Denies cough, wheezing, shortness of breath. Cardiac: Denies chest pain, palpitations, orthopnea, paroxysmal nocturnal dyspnea. GI: Positive for abdominal pain, nausea, vomiting, and worsening of chronic diarrhea GU: Denies dysuria, frequency, hesitancy or incontinence MS: Denies muscle aches, joint pain or swelling Neuro: Denies headache, neurologic deficits (focal weakness, numbness, tingling), abnormal gait Psych: Denies anxiety, depression, SI/HI/AVH Skin: Denies new rashes or lesions ID: Denies sick contacts, exotic exposures, travel  Past Medical History:  Diagnosis Date  . Acute pancreatitis 04/13/2020  . Anasarca 10/2019  . AVN (avascular necrosis of bone) ([place])   . Cataract   . Choledocholithiasis (sludge) s/p ERCP 10/2019 10/21/2020  . Chronic pain syndrome   . CKD (chronic kidney disease), stage III ([place])   . Crohn disease ([name])   . Crohn's disease of small & large intestine with SGS 1984   Caitlin Peters is a 60year old female with a history of Crohn's disease diagnosed in 60 (age 5), history of long-term steroid use with osteoporosis, S/P multiple bowel resections ([id number]) complicated by chronic back and abdominal pain, steatorrhea and short bowel syndrome. The patient has been left with ~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 attached to proximal transverse colon through rectum. She has been  . Depression   . Diverticulosis   . GERD (gastroesophageal reflux disease)   . HTN (hypertension)   . IDA (iron deficiency anemia)   . Malnutrition ([place])   . Mass in chest   . Osteoporosis   .  Osteoporosis 12/24/2014  . Pancreatitis   . 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; 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; jejunal to transverse colon  anastomosis Treated at [place] SB lengthening to 165cm Dr Alene Mires, [facility]  ESTABLISHED AT [facility]  . Vitamin B12 deficiency     Past Surgical History:  Procedure Laterality Date  . ABDOMINAL ADHESION SURGERY  01/22/2018  . APPENDECTOMY  1989  . BILIARY DILATION  11/26/2019   Procedure: BILIARY DILATION;  Surgeon: Jackquline Denmark, MD;  Location: [facility];  Service: Endoscopy;;  . BILIARY DILATION  03/08/2020   Procedure: BILIARY DILATION;  Surgeon: Irving Copas., MD;  Location: [place];  Service: Gastroenterology;;  . BIOPSY  03/08/2020   Procedure: BIOPSY;  Surgeon: Irving Copas., MD;  Location: [place];  Service: Gastroenterology;;  . CHEST WALL RESECTION     right thoracotomy,resection of chest mass with anterior rib and reconstruction using prosthetic mesh and video arthroscopy  . CHOLECYSTECTOMY  01/22/2018  . COLONOSCOPY  2019  . ENTEROSTOMY CLOSURE  04/1999  . ERCP N/A 11/26/2019   Procedure: ENDOSCOPIC RETROGRADE CHOLANGIOPANCREATOGRAPHY (ERCP);  Surgeon: Jackquline Denmark, MD;  Location: [facility];  Service: Endoscopy;  Laterality: N/A;  . ERCP N/A 03/08/2020   Procedure: ENDOSCOPIC RETROGRADE CHOLANGIOPANCREATOGRAPHY (ERCP);  Surgeon: Irving Copas., MD;  Location: [place];  Service: Gastroenterology;  Laterality: N/A;  . ESOPHAGOGASTRODUODENOSCOPY N/A 03/08/2020   Procedure: ESOPHAGOGASTRODUODENOSCOPY (EGD);  Surgeon: Irving Copas., MD;  Location: [place];  Service: Gastroenterology;  Laterality: N/A;  . EUS N/A 03/08/2020   Procedure: UPPER ENDOSCOPIC ULTRASOUND (EUS) LINEAR;  Surgeon: Irving Copas., MD;  Location: [place];  Service: Gastroenterology;  Laterality: N/A;  . ILEOCECETOMY  03/1999   ileocolon resection with abdominal stoma  . ILEOSTOMY CLOSURE  2001  . IR FLUORO GUIDE CV LINE LEFT  01/07/2020  . IR FLUORO GUIDE CV LINE LEFT  03/09/2020  . IR FLUORO GUIDE CV LINE LEFT   05/09/2020  . IR FLUORO GUIDE CV LINE LEFT  07/20/2020  . IR FLUORO GUIDE CV LINE RIGHT  08/05/2020  . IR PTA VENOUS EXCEPT DIALYSIS CIRCUIT  01/07/2020  . IR REMOVAL TUN CV CATH W/O FL  08/05/2020  . IR US GUIDE VASC ACCESS LEFT     x 2 06/17/19 and 09/14/2019  . IR US GUIDE VASC ACCESS RIGHT  08/05/2020  . KNEE SURGERY     right knee   . LAPAROSCOPIC SMALL BOWEL RESECTION  2009   2000-2009.  SB resections for Crohns Disease - now with Short gut  . OMENTECTOMY  01/22/2018  . PARTIAL HYSTERECTOMY  1984   with LSO  . REMOVAL OF STONES  11/26/2019   Procedure: REMOVAL OF STONES;  Surgeon: Jackquline Denmark, MD;  Location: [facility];  Service: Endoscopy;;  . REMOVAL OF STONES  03/08/2020   Procedure: REMOVAL OF STONES;  Surgeon: Irving Copas., MD;  Location: [place];  Service: Gastroenterology;;  . SALPINGOOPHORECTOMY Left 1984  . SALPINGOOPHORECTOMY Right 1990  . SERIAL TRANSVERSE ENTEROPLASTY (STEP) - SMALL BOWEL LENGTHENING  01/22/2018   Dr Alene Mires, [place] length from 120 to 165cm   . SMALL INTESTINE SURGERY  2002  . SMALL INTESTINE SURGERY  2003  . SPHINCTEROTOMY  11/26/2019   Procedure: SPHINCTEROTOMY;  Surgeon: Jackquline Denmark, MD;  Location: [facility];  Service: Endoscopy;;  . [place]  with RSO  . UPPER GASTROINTESTINAL ENDOSCOPY      SOCIAL HISTORY:  reports that she has quit smoking. She has never used smokeless tobacco. She reports previous alcohol use. She reports that she does not use drugs.  Allergies  Allergen Reactions  . Meperidine Hives    Other reaction(s): GI Upset Due to [place]   . Hyoscyamine Hives and Swelling    Legs swelling   Disorientation  . Cefepime Other (See Comments)    Neurotoxicity occurring in setting of AKI. Ceftriaxone tolerated during same admit  . Gabapentin Other (See Comments)    unknown  . Lyrica [Pregabalin] Other (See Comments)    unknown  . Topamax [Topiramate] Other (See  Comments)    unknown  . Zosyn [Piperacillin Sod-Tazobactam So]     Patient reports it makes her vomit, her neck stiff, and her "heart feel funny"  . Fentanyl Rash    Pt is allergic to fentanyl patch related to the glue (gives her a rash) Pt states she is NOT allergic to fentanyl IV medicine  . Morphine And Related Rash    FAMILY HISTORY: Family History  Problem Relation Age of Onset  . Breast cancer Sister   . Multiple sclerosis Sister   . Diabetes Sister   . Lupus Sister   . Colon cancer Other   . Crohn's disease Other   . Seizures Mother   . Glaucoma Mother   . CAD Father   . Heart disease Father   . Hypertension Father      Prior to Admission medications   Medication Sig Start Date End Date Taking? Authorizing Provider  acetaminophen (TYLENOL) 325 MG tablet Take 650 mg by mouth every 6 (six) hours as needed for mild pain.    Yes [provider]  budesonide (ENTOCORT EC) 3 MG 24 hr capsule Take 3 capsules (9 mg total) by mouth daily. Patient taking differently: Take 3 mg by mouth every other day. 03/06/21  Yes [name], [name], MD  buPROPion [facility] SR) 100 MG 12 hr tablet Take 200 mg by mouth daily. 03/02/21  Yes [provider]  Calcium 200 MG TABS Take 200 mg by mouth daily.   Yes [provider]  ceFEPime (MAXIPIME) IVPB Inject 2 g into the vein every 12 (twelve) hours. Indication:  Discitis/osteomyelitis First Dose: Yes Last Day of Therapy:  03/24/2021 Labs - Once weekly:  CBC/D and BMP, Labs - Every other week:  ESR and CRP Method of administration: IV Push Method of administration may be changed at the discretion of home infusion pharmacist based upon assessment of the patient and/or caregiver's ability to self-administer the medication ordered. 02/15/21 03/26/21 Yes Pokhrel, Laxman, MD  cholecalciferol (VITAMIN D3) 25 MCG (1000 UT) tablet Take 1,000 Units by mouth daily.    Yes [provider]  cycloSPORINE (RESTASIS) 0.05  % ophthalmic emulsion Place 1 drop into both eyes 2 (two) times daily.   Yes [provider]  daptomycin (CUBICIN) IVPB Inject 600 mg into the vein daily. Indication: discitis/osteomyelitis First Dose: Yes Last Day of Therapy:  03/24/2021 Labs - Once weekly:  CBC/D, BMP, and CPK Labs - Every other week:  ESR and CRP Method of administration: IV Push Method of administration may be changed at the discretion of home infusion pharmacist based upon assessment of the patient and/or caregiver's ability to self-administer the medication ordered. Patient taking differently: Inject 600 mg into the vein daily. Indication: discitis/osteomyelitis First Dose: Yes Last Day of Therapy:  03/24/2021 Labs - Once weekly:  CBC/D, BMP, and CPK Labs - Every other week:  ESR and CRP Method of administration: IV Push Method of administration may be changed at the discretion of home infusion pharmacist based upon assessment of the patient and/or caregiver's ability to self-administer the medication ordered. 02/15/21 03/26/21 Yes Pokhrel, Laxman, MD  dexlansoprazole (DEXILANT) 60 MG capsule Take 1 capsule (60 mg total) by mouth daily. 12/28/20  Yes [name], [name], MD  Dextran 70-Hypromellose 0.1-0.3 % SOLN Place 1 drop into both eyes 4 (four) times daily.   Yes [provider]  diphenoxylate-atropine (LOMOTIL) 2.5-0.025 MG tablet TAKE 1 TABLET BY MOUTH 4 TIMES DAILY AS NEEDED FOR DIARRHEA OR  LOOSE  STOOLS Patient taking differently: Take 1-2 tablets by mouth 4 (four) times daily as needed for diarrhea or loose stools. 12/28/20  Yes [name], [name], MD  DULoxetine (CYMBALTA) 30 MG capsule Take 3 capsules (90 mg total) by mouth daily. 12/07/20  Yes [name], [name], MD  estradiol (ESTRACE) 2 MG tablet Take 1 tablet (2 mg total) by mouth daily. 12/07/20  Yes [name], [name], MD  GATTEX 5 MG KIT Inject 5 mLs into the skin daily. 01/03/21  Yes [provider]   HYDROmorphone (DILAUDID) 4 MG tablet Take 4 mg by mouth 4 (four) times daily as needed for moderate pain.  09/15/20  Yes [provider]  levETIRAcetam (KEPPRA) 500 MG tablet Take 1 tablet (500 mg total) by mouth 2 (two) times daily. 02/24/21 03/26/21 Yes Cameron Sprang, MD  lipase/protease/amylase (CREON) 36000 UNITS CPEP capsule Take 1 capsule (36,000 Units total) by mouth 3 (three) times daily as needed (with meals for digestion). 06/28/20  Yes [name], [name], MD  methadone (DOLOPHINE) 5 MG tablet Take 1 tablet (5 mg total) by mouth 5 (five) times daily. 06/30/20  Yes Florencia Reasons, MD  metoprolol tartrate (LOPRESSOR) 100 MG tablet Take 1 tablet (100 mg total) by mouth 2 (two) times daily. 02/21/21  Yes [name], [name], MD  Multiple Vitamins-Minerals (MULTIVITAMIN ADULT PO) Take 1 tablet by mouth daily.   Yes [provider]  PRESCRIPTION MEDICATION Inject 1 each into the vein daily. Home TPN . [facility]  . 1 bag for 12 hours. [phone]   Yes [provider]  Probiotic Product (PROBIOTIC-10 PO) Take 1 capsule by mouth daily.    Yes [provider]  promethazine (PHENERGAN) 25 MG tablet Take 1 tablet (25 mg total) by mouth every 6 (six) hours as needed for nausea or vomiting. 02/21/21  Yes [name], [name], MD  sodium chloride 0.9 % infusion Inject 1 mL into the vein daily as needed (flush). 10/16/20  Yes [provider]  sucralfate (CARAFATE) 1 GM/10ML suspension Take 10 mLs (1 g total) by mouth 4 (four) times daily -  with meals and at bedtime. 06/28/20  Yes [name], [name], MD  Trace Minerals Cu-Mn-Se-Zn (TRALEMENT IV) Inject 1 mL into the vein See admin instructions. Used in TPN bag 4 times weekly   Yes [provider]  vitamin B-12 (CYANOCOBALAMIN) 100 MCG tablet Take 1,000 mcg by mouth daily.    Yes [provider]  amLODipine (NORVASC) 10 MG tablet Take 1 tablet (10 mg  total) by mouth daily. 12/07/20 01/06/21  [name], [name], MD  hydrALAZINE (APRESOLINE) 50 MG tablet Take 1.5 tablets (75 mg total) by mouth 3 (three) times daily. 11/22/20  02/20/21  Chandrasekhar, Lyda Kalata A, MD  loperamide (IMODIUM) 2 MG capsule TAKE 1 CAPSULE BY MOUTH AS NEEDED FOR DIARRHEA OR LOOSE STOOLS Patient not taking: No sig reported 01/20/21   [name], [name], MD  [facility] 4 MG/0.1ML LIQD nasal spray kit Place 1 spray into the nose once as needed (overdose). 10/14/20   [provider]    Physical Exam: Vitals:   03/11/21 1530 03/11/21 1600 03/11/21 1615 03/11/21 1730  BP: (!) 159/100 (!) 161/72 (!) 176/75 (!) 186/81  Pulse: 76 61 65 65  Resp: (!) 21 15 20 15   Temp:      TempSrc:      SpO2: 100% 100% 100% 100%  Weight:      Height:          Constitutional: NAD, calm, comfortable.  Acute ill-appearing.  Chronic ill-appearing. Eyes: PERRL, lids and conjunctivae normal ENMT: Mucous membranes are moist. Posterior pharynx clear of any exudate or lesions.Normal dentition.  Neck: normal, supple, no masses, no thyromegaly Respiratory: clear to auscultation bilaterally, no wheezing, no crackles. Normal respiratory effort. No accessory muscle use.  Cardiovascular: Regular rate and rhythm, no murmurs / rubs / gallops. No extremity edema. 2+ pedal pulses. No carotid bruits.  Abdomen: Mild TTP to left side abdomen with no guarding or rebound tenderness.  No masses palpated. No hepatosplenomegaly. Bowel sounds positive.  Musculoskeletal: no clubbing / cyanosis. No joint deformity upper and lower extremities. Good ROM, no contractures. Normal muscle tone.  Skin: no rashes, lesions, ulcers. No induration Neurologic: CN 2-12 grossly intact. Sensation intact, DTR normal. Strength 5/5 in all 4.  Psychiatric: Normal judgment and insight. Alert and oriented x 3. Normal mood.     Labs on Admission: I have personally reviewed following labs and imaging  studies  CBC: Recent Labs  Lab 03/08/21 1400 03/10/21 0944 03/11/21 0005  WBC 4.5 4.3 5.8  NEUTROABS 2.0 2.0 3.7  HGB 9.3* 9.6* 10.3*  HCT 28.6* 29.2* 31.3*  MCV 91.7 90.4 89.9  PLT 175 182 756   Basic Metabolic Panel: Recent Labs  Lab 03/08/21 1400 03/10/21 0944 03/11/21 0005  Caitlin Peters 141 138 139  K 4.3 4.2 3.9  CL 112* 110 111  CO2 20* 21* 21*  GLUCOSE 99 102* 109*  BUN 28* 22* 18  CREATININE 1.05* 1.03* 0.95  CALCIUM 7.8* 7.5* 7.7*   GFR: Estimated Creatinine Clearance: 62 mL/min (by C-G formula based on SCr of 0.95 mg/dL). Liver Function Tests: Recent Labs  Lab 03/08/21 1400 03/10/21 0944 03/11/21 0005  AST 26 50* 38  ALT 28 76* 65*  ALKPHOS 91 96 94  BILITOT 0.6 0.4 0.5  PROT 6.9 7.1 7.3  ALBUMIN 2.9* 3.1* 3.0*   Recent Labs  Lab 03/08/21 1400 03/10/21 0944 03/11/21 0005  LIPASE 34 35 39   No results for input(s): AMMONIA in the last 168 hours. Coagulation Profile: No results for input(s): INR, PROTIME in the last 168 hours. Cardiac Enzymes: No results for input(s): CKTOTAL, CKMB, CKMBINDEX, TROPONINI in the last 168 hours. BNP (last 3 results) No results for input(s): PROBNP in the last 8760 hours. HbA1C: No results for input(s): HGBA1C in the last 72 hours. CBG: No results for input(s): GLUCAP in the last 168 hours. Lipid Profile: No results for input(s): CHOL, HDL, LDLCALC, TRIG, CHOLHDL, LDLDIRECT in the last 72 hours. Thyroid Function Tests: No results for input(s): TSH, T4TOTAL, FREET4, T3FREE, THYROIDAB in the last 72 hours. Anemia Panel: No results for input(s): VITAMINB12, FOLATE, FERRITIN, TIBC,  IRON, RETICCTPCT in the last 72 hours. Urine analysis:    Component Value Date/Time   COLORURINE YELLOW 03/11/2021 1400   APPEARANCEUR CLEAR 03/11/2021 1400   LABSPEC 1.023 03/11/2021 1400   PHURINE 7.0 03/11/2021 1400   GLUCOSEU NEGATIVE 03/11/2021 1400   HGBUR NEGATIVE 03/11/2021 1400   BILIRUBINUR NEGATIVE 03/11/2021 1400   KETONESUR  5 (A) 03/11/2021 1400   PROTEINUR >=300 (A) 03/11/2021 1400   NITRITE NEGATIVE 03/11/2021 1400   LEUKOCYTESUR NEGATIVE 03/11/2021 1400   Sepsis Labs: !!!!!!!!!!!!!!!!!!!!!!!!!!!!!!!!!!!!!!!!!!!! @LABRCNTIP (procalcitonin:4,lacticidven:4) )No results found for this or any previous visit (from the past 240 hour(s)).   Radiological Exams on Admission: DG Abd 1 View  Result Date: 03/11/2021 CLINICAL DATA:  Left lower quadrant and low back pain for 4 days. Crohn disease. EXAM: ABDOMEN - 1 VIEW COMPARISON:  Plain film and CT of 1 day prior. FINDINGS: Surgical sutures within the abdomen bilaterally. No gaseous distention of bowel loops. No gross free intraperitoneal air. Right femoral catheter terminates over the right hemidiaphragm. Contrast within the bladder. Convex left lumbar spine curvature. IMPRESSION: No evidence of bowel obstruction or acute process. Electronically Signed   By: Abigail Miyamoto M.D.   On: 03/11/2021 16:02   DG Abdomen 1 View  Result Date: 03/10/2021 CLINICAL DATA:  60 year old female with right femoral catheter, tunneled catheter in right leg. Crohn disease. EXAM: ABDOMEN - 1 VIEW COMPARISON:  Abdominal radiographs 03/08/2021 and earlier. FINDINGS: Portable AP supine view at at 0939 hours. Right femoral approach vascular catheter has stable configuration in the right inguinal region from 2 days ago. It tracks cephalad, with the tip at the level of the hepatic IVC. No kinking or discontinuity identified. Non obstructed bowel gas pattern. Multiple bowel staple lines in the bilateral abdomen. Stable visualized osseous structures. Levoconvex lumbar scoliosis. IMPRESSION: 1. Right femoral approach vascular catheter terminates at the hepatic IVC level. No adverse features identified. 2. Postoperative changes to the abdomen. Nonobstructed bowel-gas pattern. Electronically Signed   By: Genevie Ann M.D.   On: 03/10/2021 10:10   CT Abdomen Pelvis W Contrast  Result Date: 03/10/2021 CLINICAL DATA:   60 year old with nausea, vomiting and abdominal pain. History of Crohn's disease. EXAM: CT ABDOMEN AND PELVIS WITH CONTRAST TECHNIQUE: Multidetector CT imaging of the abdomen and pelvis was performed using the standard protocol following bolus administration of intravenous contrast. CONTRAST:  174m OMNIPAQUE IOHEXOL 300 MG/ML  SOLN COMPARISON:  02/09/2021 FINDINGS: Lower chest: Lung bases are clear.  No pleural effusions. Hepatobiliary: Normal appearance of the liver. The gallbladder is absent. Main portal venous system is patent. Extrahepatic bile duct measures roughly 6 mm. No significant biliary dilatation. Pancreas: Mild pancreatic duct dilatation is stable. No peripancreatic inflammation. Spleen: Normal in size without focal abnormality. Adrenals/Urinary Tract: Normal adrenal glands. Normal appearance of the kidneys. No hydronephrosis. No suspicious renal lesions. Moderate distention of the urinary bladder. Stomach/Bowel: Multiple surgical changes throughout the bowel. Scattered fluid-filled loops of bowel, largest in the left lateral abdomen. Overall, nonobstructive bowel gas pattern. No focal bowel inflammation. Vascular/Lymphatic: Aorta and main visceral arteries are patent. Mild atherosclerotic disease in abdominal aorta. Negative for an abdominal aortic aneurysm. Central venous catheter enters the right common femoral vein and terminates in the upper IVC just below the inferior cavoatrial junction. No significant lymph node enlargement in the abdomen or pelvis. Reproductive: Status post hysterectomy. No adnexal masses. Other: Negative for ascites.  Negative for free air. Musculoskeletal: Irregularity involving the right iliac crest is similar to the previous examination. Degenerative facet  disease at L5-S1. No acute bone abnormality. IMPRESSION: 1. No acute abnormality in the abdomen or pelvis. 2. Extensive postsurgical changes involving the bowel. Scattered areas of fluid-filled bowel but no evidence  for obstruction and no focal bowel inflammation. 3. Right femoral central line that terminates in the upper IVC just below the inferior cavoatrial junction. Electronically Signed   By: Markus Daft M.D.   On: 03/10/2021 11:13     All images have been reviewed by me personally.  EKG: Independently reviewed.   Assessment/Plan Principal Problem:   Intractable nausea and vomiting Active Problems:   Chronic diarrhea   Crohn's disease of small & large intestine with SGS   Diarrhea   Short gut syndrome   HTN (hypertension), benign   Chronic pain syndrome   Abdominal pain   Methadone dependence ([name])   CKD (chronic kidney disease) stage 3, GFR 30-59 ml/min ([name])   Seizure ([name])   Thoracic discitis   Assessment  plan  #Intractable nausea, vomiting #Worsening of chronic abdominal pain #Worsening of chronic diarrhea She reports intractable nausea, vomiting as well as worsening of her chronic diarrhea and abdominal pain. Workups including labs, KUB and CT abdomen are unremarkable.  Given her ongoing IV antibiotic use, will need to rule out C. Difficile.   -Admit observation - lipase normal -COVID is pending -GI PCR panel and C. difficile pending -Follow-up on renal function and electrolytes -Antiemesis as needed treatment -IV fluid hydration -Continue home TPN and lipid    #Recent admission for T2-T5 discitis and osteomyelitis   -Continue home daptomycin and cefepime with last dose of therapy on 03/24/2021 -Inform ID in the morning about patient's admission   #History of Crohn disease #History of short gut syndrome home TPN and lipids infusion #  s/p previous multiple small bowel resections followed by [facility] and now establish care with [facility]  - noted -Continue home budesonide for Crohn disease -Continue home Gattex-teduglutide for short gut syndrome.   # CKD stage III, baseline creatinine 1.1-1.3 # Chronic anemia, baseline HGB 8-10  - HGB 10.3  - Cr 0.95 on  admission -Monitor   #HTN  -Chronic and stable  #Seizures -continue home Keppra-switch to IV form due to  nausea vomiting   #Chronic pain syndrome #Depression -She states that she takes home methadone and Dilaudid which were managed by pain clinic doctor. On her DC summary in 11/2020, she was on both methadone and Dilaudid pills but I could not see Dilaudid in DC summary in 01/2021 - She could not tolerate oral pills due to intractable N/V, and asked me to temporarily place her on IV pain meds.  -Continue home Wellbutrin and Cymbalta             Body mass index is 21.41 kg/m.     DVT prophylaxis: Lovenox Code Status: Full code Family Communication: None at bedside Consults called: None Admission status:obs   Status is: Observation  The patient remains OBS appropriate and will d/c before 2 midnights.  Dispo: The patient is from: Home              Anticipated d/c is to: Home              Patient currently is not medically stable to d/c.   Difficult to place patient No       Time Spent: 65 minutes.  >50% of the time was devoted to discussing the patients care, assessment, plan and disposition with other care givers along with  counseling the patient about the risks and benefits of treatment.    Charlann Lange MD [facility]  If 7PM-7AM, please contact night-coverage   03/11/2021, 6:00 PM

## 2021-03-11 NOTE — Progress Notes (Signed)
PHARMACY - TOTAL PARENTERAL NUTRITION CONSULT NOTE   Indication: Short bowel syndrome  Patient Measurements: Height: 5' 7"  (170.2 cm) Weight: 62 kg (136 lb 11 oz) IBW/kg (Calculated) : 61.6 TPN AdjBW (KG): 62 Body mass index is 21.41 kg/m.  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 She usually tolerates very small quantities of PO. Teduglutide started January 2022 for short bowel syndrome.  Admitted with intractable N/V and abdominal pain x 2-3d.  Pharmacy consulted to continue home cyclic TPN.  Glucose / Insulin: CBGs low 100s Electrolytes: Bicarb 21, CoCa OK, others wnl Renal: SCr 0.95, BUN wnl Hepatic: Mild ALT elevation, AST/Tbili wnl Intake / Output; MIVF: NS @75  started GI Imaging: No evidence of SBO or anything acute GI Surgeries / Procedures:   Central access: Tunneled CVC (fem) TPN start date: Continued from PTA  Nutritional Goals  Outpatient [employer] TPN was providing:  1800 mL TPN over 12 hours daily.  Lipids on MWF only.  85 g protein daily and 7 day average 1292 kcal daily  Current Nutrition:  Regular diet and TPN  Plan:  Beyond 1200 cutoff time for TPN start, will start 4/17 TPN labs in AM  Bertis Ruddy, PharmD Clinical Pharmacist ED Pharmacist Phone # [phone] 03/11/2021 6:58 PM

## 2021-03-12 ENCOUNTER — Encounter (HOSPITAL_COMMUNITY): Payer: Self-pay | Admitting: Internal Medicine

## 2021-03-12 DIAGNOSIS — N183 Chronic kidney disease, stage 3 unspecified: Secondary | ICD-10-CM | POA: Diagnosis not present

## 2021-03-12 DIAGNOSIS — B349 Viral infection, unspecified: Secondary | ICD-10-CM | POA: Diagnosis present

## 2021-03-12 DIAGNOSIS — M810 Age-related osteoporosis without current pathological fracture: Secondary | ICD-10-CM | POA: Diagnosis present

## 2021-03-12 DIAGNOSIS — D638 Anemia in other chronic diseases classified elsewhere: Secondary | ICD-10-CM | POA: Diagnosis present

## 2021-03-12 DIAGNOSIS — Z87891 Personal history of nicotine dependence: Secondary | ICD-10-CM | POA: Diagnosis not present

## 2021-03-12 DIAGNOSIS — Z9049 Acquired absence of other specified parts of digestive tract: Secondary | ICD-10-CM | POA: Diagnosis not present

## 2021-03-12 DIAGNOSIS — I129 Hypertensive chronic kidney disease with stage 1 through stage 4 chronic kidney disease, or unspecified chronic kidney disease: Secondary | ICD-10-CM | POA: Diagnosis present

## 2021-03-12 DIAGNOSIS — K912 Postsurgical malabsorption, not elsewhere classified: Secondary | ICD-10-CM | POA: Diagnosis present

## 2021-03-12 DIAGNOSIS — G894 Chronic pain syndrome: Secondary | ICD-10-CM | POA: Diagnosis present

## 2021-03-12 DIAGNOSIS — R197 Diarrhea, unspecified: Secondary | ICD-10-CM | POA: Diagnosis not present

## 2021-03-12 DIAGNOSIS — Z7952 Long term (current) use of systemic steroids: Secondary | ICD-10-CM | POA: Diagnosis not present

## 2021-03-12 DIAGNOSIS — E538 Deficiency of other specified B group vitamins: Secondary | ICD-10-CM | POA: Diagnosis present

## 2021-03-12 DIAGNOSIS — Z90710 Acquired absence of both cervix and uterus: Secondary | ICD-10-CM | POA: Diagnosis not present

## 2021-03-12 DIAGNOSIS — M4624 Osteomyelitis of vertebra, thoracic region: Secondary | ICD-10-CM | POA: Diagnosis present

## 2021-03-12 DIAGNOSIS — K529 Noninfective gastroenteritis and colitis, unspecified: Secondary | ICD-10-CM | POA: Diagnosis not present

## 2021-03-12 DIAGNOSIS — K219 Gastro-esophageal reflux disease without esophagitis: Secondary | ICD-10-CM | POA: Diagnosis present

## 2021-03-12 DIAGNOSIS — M4644 Discitis, unspecified, thoracic region: Secondary | ICD-10-CM | POA: Diagnosis present

## 2021-03-12 DIAGNOSIS — Z20822 Contact with and (suspected) exposure to covid-19: Secondary | ICD-10-CM | POA: Diagnosis present

## 2021-03-12 DIAGNOSIS — Z8616 Personal history of COVID-19: Secondary | ICD-10-CM | POA: Diagnosis not present

## 2021-03-12 DIAGNOSIS — F32A Depression, unspecified: Secondary | ICD-10-CM | POA: Diagnosis present

## 2021-03-12 DIAGNOSIS — R569 Unspecified convulsions: Secondary | ICD-10-CM | POA: Diagnosis not present

## 2021-03-12 DIAGNOSIS — K5080 Crohn's disease of both small and large intestine without complications: Secondary | ICD-10-CM | POA: Diagnosis present

## 2021-03-12 DIAGNOSIS — Z8249 Family history of ischemic heart disease and other diseases of the circulatory system: Secondary | ICD-10-CM | POA: Diagnosis not present

## 2021-03-12 DIAGNOSIS — R112 Nausea with vomiting, unspecified: Secondary | ICD-10-CM | POA: Diagnosis not present

## 2021-03-12 DIAGNOSIS — G40909 Epilepsy, unspecified, not intractable, without status epilepticus: Secondary | ICD-10-CM | POA: Diagnosis present

## 2021-03-12 DIAGNOSIS — Z79899 Other long term (current) drug therapy: Secondary | ICD-10-CM | POA: Diagnosis not present

## 2021-03-12 DIAGNOSIS — R1084 Generalized abdominal pain: Secondary | ICD-10-CM | POA: Diagnosis present

## 2021-03-12 DIAGNOSIS — N1831 Chronic kidney disease, stage 3a: Secondary | ICD-10-CM | POA: Diagnosis present

## 2021-03-12 LAB — COMPREHENSIVE METABOLIC PANEL
ALT: 48 U/L — ABNORMAL HIGH (ref 0–44)
AST: 27 U/L (ref 15–41)
Albumin: 2.7 g/dL — ABNORMAL LOW (ref 3.5–5.0)
Alkaline Phosphatase: 98 U/L (ref 38–126)
Anion gap: 6 (ref 5–15)
BUN: 14 mg/dL (ref 6–20)
CO2: 21 mmol/L — ABNORMAL LOW (ref 22–32)
Calcium: 7 mg/dL — ABNORMAL LOW (ref 8.9–10.3)
Chloride: 111 mmol/L (ref 98–111)
Creatinine, Ser: 0.95 mg/dL (ref 0.44–1.00)
GFR, Estimated: >60 mL/min (ref >60)
Glucose, Bld: 106 mg/dL — ABNORMAL HIGH (ref 70–99)
Potassium: 3.6 mmol/L (ref 3.5–5.1)
Sodium: 138 mmol/L (ref 135–145)
Total Bilirubin: 0.9 mg/dL (ref 0.3–1.2)
Total Protein: 6.8 g/dL (ref 6.5–8.1)

## 2021-03-12 LAB — URINE CULTURE: Culture: NO GROWTH

## 2021-03-12 LAB — CBC
HCT: 31.1 % — ABNORMAL LOW (ref 36.0–46.0)
Hemoglobin: 10.1 g/dL — ABNORMAL LOW (ref 12.0–15.0)
MCH: 29.5 pg (ref 26.0–34.0)
MCHC: 32.5 g/dL (ref 30.0–36.0)
MCV: 90.9 fL (ref 80.0–100.0)
Platelets: 174 10*3/uL (ref 150–400)
RBC: 3.42 MIL/uL — ABNORMAL LOW (ref 3.87–5.11)
RDW: 16 % — ABNORMAL HIGH (ref 11.5–15.5)
WBC: 4.7 10*3/uL (ref 4.0–10.5)
nRBC: 0 % (ref 0.0–0.2)

## 2021-03-12 LAB — PHOSPHORUS: Phosphorus: 1.8 mg/dL — ABNORMAL LOW (ref 2.5–4.6)

## 2021-03-12 LAB — CK: Total CK: 84 U/L (ref 38–234)

## 2021-03-12 LAB — MRSA PCR SCREENING: MRSA by PCR: NEGATIVE

## 2021-03-12 LAB — MAGNESIUM: Magnesium: 1.6 mg/dL — ABNORMAL LOW (ref 1.7–2.4)

## 2021-03-12 MED ORDER — SODIUM CHLORIDE 0.9 % IV SOLN
25.0000 mg | Freq: Four times a day (QID) | INTRAVENOUS | Status: DC | PRN
  Filled 2021-03-12: qty 1

## 2021-03-12 MED ORDER — ONDANSETRON HCL 4 MG/2ML IJ SOLN
4.0000 mg | Freq: Four times a day (QID) | INTRAMUSCULAR | Status: DC | PRN
  Administered 2021-03-12 – 2021-03-13 (×4): 4 mg via INTRAVENOUS
  Filled 2021-03-12 (×4): qty 2

## 2021-03-12 MED ORDER — MAGNESIUM SULFATE 2 GM/50ML IV SOLN
2.0000 g | Freq: Once | INTRAVENOUS | Status: AC
  Administered 2021-03-12: 2 g via INTRAVENOUS
  Filled 2021-03-12: qty 50

## 2021-03-12 MED ORDER — TRAVASOL 10 % IV SOLN
INTRAVENOUS | Status: AC
  Filled 2021-03-12: qty 1007

## 2021-03-12 MED ORDER — POTASSIUM PHOSPHATES 15 MMOLE/5ML IV SOLN
15.0000 mmol | Freq: Once | INTRAVENOUS | Status: AC
  Administered 2021-03-12: 15 mmol via INTRAVENOUS
  Filled 2021-03-12: qty 5

## 2021-03-12 NOTE — Progress Notes (Signed)
PHARMACY - TOTAL PARENTERAL NUTRITION CONSULT NOTE   Indication: Short bowel syndrome  Patient Measurements: Height: 5' 7"  (170.2 cm) Weight: 62 kg (136 lb 11 oz) IBW/kg (Calculated) : 61.6 TPN AdjBW (KG): 62 Body mass index is 21.41 kg/m.  Assessment: 38 yoF, hx of short gut syndrome and Crohn's disease on chronic cyclic TPN since 0092. She usually tolerates very small quantities of PO. Teduglutide started January 2022 for short bowel syndrome.  Admitted with intractable N/V and abdominal pain x 2-3d.  Pharmacy consulted to continue home cyclic TPN.  Glucose / Insulin: No hx DM. CBGs <150 and has not typically needed insulin Electrolytes: Bicarb 21, Phos low 1.8, Mag low 1.6, CoCa low 8, others wnl Renal: SCr 0.95, BUN wnl Hepatic: Mild ALT elevation, AST/Tbili/Alk phos wnl, albumin 2.7 Intake / Output; MIVF: UOP not accurately documented, NS @75   GI Imaging: 4/15 Abd XR: Nonobstructed bowel-gas pattern 4/15 CT abd/pelvis: No acute abnormality 4/16 Abd XR: No evidence of bowel obstruction or acute process   GI Surgeries / Procedures:   Central access: Tunneled CVC (fem) TPN start date: Continued from PTA  Nutritional Goals (RD recommendation on 3/18):   Kcal:1875-2025 kcal, Protein:95-105 grams, Fluid:>/= 2 L/day Goal Cyclic TPN is 3300 ml over 12 hr to provides 101 g AA and 1924 kcals per day  Chronic Home TPN [employer] TPN was providing:  1800 mL TPN over 12 hours daily.  Lipids on MWF only.  85 g protein daily and 7 day average 1481 kcal daily TTSS: 1224 kcal, 85 g protein, 260 g dextrose MWF: 1824 kcal, 85 g protein, 260 g dextrose, 60 g lipid Electrolytes: Na 150 meq, K 92.2 meq, Phos 28.5 mmol, no Ca, Mag 10 meq, Cl:Ac 1:2  Current Nutrition:  Clear liquids and TPN  Plan:  Continue cyclic TPN, infuse 7622 ml over 12 hours: 86 ml/hr x 1 hr then 173 ml/hr x 10 hrs then 7m/hr x 1 hr This TPN provides 101 g of protein, 285 g of dextrose, and 55 g of lipids for a  total of 1924 kcals meeting 100% of patient needs Electrolytes in TPN (similar to home TPN exc with Ca): Na 714m/L, K 5086mL, Ca 5mE75m, Mg 6mEq74m and Phos 15mmo4m Cl:Ac ratio: 1:2 Add MVI, trace elements to TPN Monitor TPN labs Mon/Thurs  KPhos 15 mmol IV x1 Mag 2 g IV x1  Thank you for involving pharmacy in this patient's care.  JennifRenold GentamD, BCPS Clinical Pharmacist Clinical phone for 03/12/2021 until 3p is [phone] 7:24 AM  **Pharmacist phone directory can be found on amion.[facility]

## 2021-03-12 NOTE — Progress Notes (Signed)
PROGRESS NOTE        PATIENT DETAILS Name: Caitlin Peters Age: 60 y.o. Sex: female Date of Birth: 08/15/1961 Admit Date: 03/10/2021 Admitting Physician Evalee Mutton Kristeen Mans, MD YTK:[id number] Everardo Beals, MD  Brief Narrative: Patient is a 60 y.o. 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  Patient was admitted for  Significant events: 3/17-3/23>> admit for back pain-found to have T2-T5 discitis-discharged on Dapto/cefepime-until 4/29 4/15>> admit to [facility] for intractable nausea/vomiting/worsening diarrhea/abdominal pain.  Significant studies: 4/15>> CT abdomen/pelvis: No acute abnormality  Antimicrobial therapy: Continue daptomycin-stop date 4/29 Continue cefepime-stop date 4/29  Microbiology data: 4/16>> urine culture: Pending 4/16>> Covid PCR: Negative  Procedures : None  Consults: None  DVT Prophylaxis : enoxaparin (LOVENOX) injection 40 mg Start: 03/11/21 1900   Subjective: Feels weak but better than how she first presented.  Still nauseous-had one episode of vomiting earlier.  But no diarrhea since yesterday.   Assessment/Plan: Intractable nausea/vomiting/diarrhea/abdominal pain: Unclear etiology-could have been a viral syndrome-still nauseous-had one episode of vomiting earlier but diarrhea has resolved.  If diarrhea reoccurs-we will need stool studies including C. difficile PCR as patient has been on antibiotics since last admission.  Attempt to slowly advance diet-currently on clear liquids.  Continue supportive care-and as needed antiemetics.  Recent hospitalization for T2-T5 discitis/osteomyelitis: Continue daptomycin/cefepime.  History of short gut syndrome-due to Crohn's disease and multiple  small bowel resection: On TNA/lipid infusion per pharmacy-electrolytes being repleted per pharmacy-remains on Gattex and budesonide for Crohn's disease.  Diarrhea better-nausea and vomiting slowly improving on as needed antiemetics  Hypophosphatemia/hypomagnesemia: Being repleted per pharmacy protocol.  Normocytic anemia: Due to chronic disease-anemia is mild-stable for periodic follow-up.  Seizure disorder: Continue Keppra  Chronic pain syndrome: Continue methadone  HTN: BP relatively stable-continue amlodipine, hydralazine and metoprolol.  CKD stage IIIa: Creatinine close to baseline  Depression: Stable-continue Wellbutrin/Cymbalta  Deconditioning: Due to acute illness-sometimes uses a walker at home-await PT/OT eval.   Diet: Diet Order            Diet clear liquid Room service appropriate? Yes; Fluid consistency: Thin  Diet effective now                  Code Status: Full code  Family Communication: Daughter-Deven [phone]-on 4/17-over the phone   Disposition Plan: Status is: Observation  The patient will require care spanning > 2 midnights and should be moved to inpatient because: Inpatient level of care appropriate due to severity of illness  Dispo: The patient is from: Home              Anticipated d/c is to: Home              Patient currently is not medically stable to d/c.   Difficult to place patient No   Barriers to Discharge: Ongoing nausea/vomiting-on clear liquids-diet being advanced-awaiting stool studies.  Electrolytes being repleted.  History of short gut syndrome  Antimicrobial agents: Anti-infectives (From admission, onward)   Start     Dose/Rate Route Frequency Ordered Stop   03/11/21 2000  DAPTOmycin (CUBICIN) 600 mg in sodium chloride 0.9 % IVPB        600 mg 124 mL/hr over 30 Minutes  Intravenous Daily 03/11/21 1828     03/11/21 1830  ceFEPIme (MAXIPIME) 2 g in sodium chloride 0.9 % 100 mL IVPB        2 g 200 mL/hr over 30  Minutes Intravenous Every 8 hours 03/11/21 1824         Time spent: 35 minutes-Greater than 50% of this time was spent in counseling, explanation of diagnosis, planning of further management, and coordination of care.  MEDICATIONS: Scheduled Meds: . amLODipine  10 mg Oral Daily  . budesonide  9 mg Oral Daily  . buPROPion  200 mg Oral Daily  . calcium carbonate  1 tablet Oral Daily  . Chlorhexidine Gluconate Cloth  6 each Topical Daily  . cholecalciferol  1,000 Units Oral Daily  . cycloSPORINE  1 drop Both Eyes BID  . DULoxetine  90 mg Oral Daily  . enoxaparin (LOVENOX) injection  40 mg Subcutaneous Q24H  . estradiol  2 mg Oral Daily  . hydrALAZINE  75 mg Oral TID  . methadone  5 mg Oral Q6H  . metoprolol tartrate  100 mg Oral BID  . polyvinyl alcohol  1 drop Both Eyes QID  . sucralfate  1 g Oral TID WC & HS  . Teduglutide (rDNA)  5 mL Subcutaneous Daily  . vitamin B-12  1,000 mcg Oral Daily   Continuous Infusions: . sodium chloride Stopped (03/12/21 2297)  . ceFEPime (MAXIPIME) IV 2 g (03/12/21 0701)  . DAPTOmycin (CUBICIN)  IV Stopped (03/11/21 2102)  . levETIRAcetam Stopped (03/12/21 9892)  . magnesium sulfate bolus IVPB 2 g (03/12/21 0956)  . potassium PHOSPHATE IVPB (in mmol)    . promethazine (PHENERGAN) injection (IM or IVPB)    . TPN CYCLIC-ADULT (ION)     PRN Meds:.acetaminophen **OR** acetaminophen, HYDROmorphone **OR** HYDROmorphone (DILAUDID) injection, lipase/protease/amylase, ondansetron (ZOFRAN) IV, promethazine (PHENERGAN) injection (IM or IVPB), sodium chloride flush   PHYSICAL EXAM: Vital signs: Vitals:   03/11/21 2211 03/11/21 2338 03/12/21 0000 03/12/21 0607  BP: (!) 168/80 (!) 188/74  (!) 176/77  Pulse: (!) 59 62  63  Resp: 16 18  16   Temp: 98.7 F (37.1 C) 98.2 F (36.8 C)  99 F (37.2 C)  TempSrc: Oral Oral  Oral  SpO2: 100% 100% 96% 100%  Weight:      Height:       Filed Weights   03/10/21 2311  Weight: 62 kg   Body mass index is  21.41 kg/m.   Gen Exam:Alert awake-not in any distress HEENT:atraumatic, normocephalic Chest: B/L clear to auscultation anteriorly CVS:S1S2 regular Abdomen:soft non tender, non distended Extremities:no edema Neurology: Non focal Skin: no rash  I have personally reviewed following labs and imaging studies  LABORATORY DATA: CBC: Recent Labs  Lab 03/08/21 1400 03/10/21 0944 03/11/21 0005 03/12/21 0453  WBC 4.5 4.3 5.8 4.7  NEUTROABS 2.0 2.0 3.7  --   HGB 9.3* 9.6* 10.3* 10.1*  HCT 28.6* 29.2* 31.3* 31.1*  MCV 91.7 90.4 89.9 90.9  PLT 175 182 210 119    Basic Metabolic Panel: Recent Labs  Lab 03/08/21 1400 03/10/21 0944 03/11/21 0005 03/12/21 0453  NA 141 138 139 138  K 4.3 4.2 3.9 3.6  CL 112* 110 111 111  CO2 20* 21* 21* 21*  GLUCOSE 99 102* 109* 106*  BUN 28* 22* 18 14  CREATININE 1.05* 1.03* 0.95 0.95  CALCIUM 7.8* 7.5* 7.7* 7.0*  MG  --   --   --  1.6*  PHOS  --   --   --  1.8*    GFR: Estimated Creatinine Clearance: 62 mL/min (by C-G formula based on SCr of 0.95 mg/dL).  Liver Function Tests: Recent Labs  Lab 03/08/21 1400 03/10/21 0944 03/11/21 0005 03/12/21 0453  AST 26 50* 38 27  ALT 28 76* 65* 48*  ALKPHOS 91 96 94 98  BILITOT 0.6 0.4 0.5 0.9  PROT 6.9 7.1 7.3 6.8  ALBUMIN 2.9* 3.1* 3.0* 2.7*   Recent Labs  Lab 03/08/21 1400 03/10/21 0944 03/11/21 0005  LIPASE 34 35 39   No results for input(s): AMMONIA in the last 168 hours.  Coagulation Profile: No results for input(s): INR, PROTIME in the last 168 hours.  Cardiac Enzymes: Recent Labs  Lab 03/12/21 0453  CKTOTAL 84    BNP (last 3 results) No results for input(s): PROBNP in the last 8760 hours.  Lipid Profile: No results for input(s): CHOL, HDL, LDLCALC, TRIG, CHOLHDL, LDLDIRECT in the last 72 hours.  Thyroid Function Tests: No results for input(s): TSH, T4TOTAL, FREET4, T3FREE, THYROIDAB in the last 72 hours.  Anemia Panel: No results for input(s): VITAMINB12,  FOLATE, FERRITIN, TIBC, IRON, RETICCTPCT in the last 72 hours.  Urine analysis:    Component Value Date/Time   COLORURINE YELLOW 03/11/2021 1400   APPEARANCEUR CLEAR 03/11/2021 1400   LABSPEC 1.023 03/11/2021 1400   PHURINE 7.0 03/11/2021 1400   GLUCOSEU NEGATIVE 03/11/2021 1400   HGBUR NEGATIVE 03/11/2021 1400   BILIRUBINUR NEGATIVE 03/11/2021 1400   KETONESUR 5 (A) 03/11/2021 1400   PROTEINUR >=300 (A) 03/11/2021 1400   NITRITE NEGATIVE 03/11/2021 1400   LEUKOCYTESUR NEGATIVE 03/11/2021 1400    Sepsis Labs: Lactic Acid, Venous    Component Value Date/Time   LATICACIDVEN 1.0 02/09/2021 1928    MICROBIOLOGY: Recent Results (from the past 240 hour(s))  SARS CORONAVIRUS 2 (TAT 6-24 HRS) Nasopharyngeal Nasopharyngeal Swab     Status: None   Collection Time: 03/11/21  6:36 PM   Specimen: Nasopharyngeal Swab  Result Value Ref Range Status   SARS Coronavirus 2 NEGATIVE NEGATIVE Final    Comment: (NOTE) SARS-CoV-2 target nucleic acids are NOT DETECTED.  The SARS-CoV-2 RNA is generally detectable in upper and lower respiratory specimens during the acute phase of infection. Negative results do not preclude SARS-CoV-2 infection, do not rule out co-infections with other pathogens, and should not be used as the sole basis for treatment or other patient management decisions. Negative results must be combined with clinical observations, patient history, and epidemiological information. The expected result is Negative.  Fact Sheet for Patients: SugarRoll.be  Fact Sheet for Healthcare Providers: https://www.woods-mathews.com/  This test is not yet approved or cleared by the [place] FDA and  has been authorized for detection and/or diagnosis of SARS-CoV-2 by FDA under an Emergency Use Authorization (EUA). This EUA will remain  in effect (meaning this test can be used) for the duration of the COVID-19 declaration under Se ction  564(b)(1) of the Act, 21 U.S.C. section 360bbb-3(b)(1), unless the authorization is terminated or revoked sooner.  Performed at [facility], [place] [address], [place], [place] [address]   MRSA PCR Screening     Status: None   Collection Time: 03/11/21 10:13 PM   Specimen: Nasal Mucosa; Nasopharyngeal  Result Value Ref Range Status   MRSA by PCR NEGATIVE NEGATIVE Final    Comment:        The GeneXpert MRSA Assay (FDA approved for NASAL specimens only), is one component of a comprehensive MRSA colonization surveillance program. It is  not intended to diagnose MRSA infection nor to guide or monitor treatment for MRSA infections. Performed at [facility], [place] [address] [place], [place] [address]     RADIOLOGY STUDIES/RESULTS: DG Abd 1 View  Result Date: 03/11/2021 CLINICAL DATA:  Left lower quadrant and low back pain for 4 days. Crohn disease. EXAM: ABDOMEN - 1 VIEW COMPARISON:  Plain film and CT of 1 day prior. FINDINGS: Surgical sutures within the abdomen bilaterally. No gaseous distention of bowel loops. No gross free intraperitoneal air. Right femoral catheter terminates over the right hemidiaphragm. Contrast within the bladder. Convex left lumbar spine curvature. IMPRESSION: No evidence of bowel obstruction or acute process. Electronically Signed   By: Abigail Miyamoto M.D.   On: 03/11/2021 16:02   CT Abdomen Pelvis W Contrast  Result Date: 03/10/2021 CLINICAL DATA:  60 year old with nausea, vomiting and abdominal pain. History of Crohn's disease. EXAM: CT ABDOMEN AND PELVIS WITH CONTRAST TECHNIQUE: Multidetector CT imaging of the abdomen and pelvis was performed using the standard protocol following bolus administration of intravenous contrast. CONTRAST:  176m OMNIPAQUE IOHEXOL 300 MG/ML  SOLN COMPARISON:  02/09/2021 FINDINGS: Lower chest: Lung bases are clear.  No pleural effusions. Hepatobiliary: Normal appearance of the liver. The gallbladder is absent. Main portal  venous system is patent. Extrahepatic bile duct measures roughly 6 mm. No significant biliary dilatation. Pancreas: Mild pancreatic duct dilatation is stable. No peripancreatic inflammation. Spleen: Normal in size without focal abnormality. Adrenals/Urinary Tract: Normal adrenal glands. Normal appearance of the kidneys. No hydronephrosis. No suspicious renal lesions. Moderate distention of the urinary bladder. Stomach/Bowel: Multiple surgical changes throughout the bowel. Scattered fluid-filled loops of bowel, largest in the left lateral abdomen. Overall, nonobstructive bowel gas pattern. No focal bowel inflammation. Vascular/Lymphatic: Aorta and main visceral arteries are patent. Mild atherosclerotic disease in abdominal aorta. Negative for an abdominal aortic aneurysm. Central venous catheter enters the right common femoral vein and terminates in the upper IVC just below the inferior cavoatrial junction. No significant lymph node enlargement in the abdomen or pelvis. Reproductive: Status post hysterectomy. No adnexal masses. Other: Negative for ascites.  Negative for free air. Musculoskeletal: Irregularity involving the right iliac crest is similar to the previous examination. Degenerative facet disease at L5-S1. No acute bone abnormality. IMPRESSION: 1. No acute abnormality in the abdomen or pelvis. 2. Extensive postsurgical changes involving the bowel. Scattered areas of fluid-filled bowel but no evidence for obstruction and no focal bowel inflammation. 3. Right femoral central line that terminates in the upper IVC just below the inferior cavoatrial junction. Electronically Signed   By: AMarkus DaftM.D.   On: 03/10/2021 11:13     LOS: 0 days   SOren Binet MD  [facility]    To contact the attending provider between 7A-7P or the covering provider during after hours 7P-7A, please log into the web site [email or web address] and access using universal [place] password for that web site. If you do  not have the password, please call the hospital operator.  03/12/2021, 10:39 AM

## 2021-03-13 DIAGNOSIS — N183 Chronic kidney disease, stage 3 unspecified: Secondary | ICD-10-CM | POA: Diagnosis not present

## 2021-03-13 DIAGNOSIS — K529 Noninfective gastroenteritis and colitis, unspecified: Secondary | ICD-10-CM | POA: Diagnosis not present

## 2021-03-13 DIAGNOSIS — G894 Chronic pain syndrome: Secondary | ICD-10-CM | POA: Diagnosis not present

## 2021-03-13 DIAGNOSIS — B349 Viral infection, unspecified: Secondary | ICD-10-CM | POA: Diagnosis not present

## 2021-03-13 DIAGNOSIS — R1084 Generalized abdominal pain: Secondary | ICD-10-CM | POA: Diagnosis not present

## 2021-03-13 DIAGNOSIS — R112 Nausea with vomiting, unspecified: Secondary | ICD-10-CM | POA: Diagnosis not present

## 2021-03-13 LAB — TRIGLYCERIDES: Triglycerides: 85 mg/dL (ref <150)

## 2021-03-13 LAB — DIFFERENTIAL
Abs Immature Granulocytes: 0.01 10*3/uL (ref 0.00–0.07)
Basophils Absolute: 0 10*3/uL (ref 0.0–0.1)
Basophils Relative: 1 %
Eosinophils Absolute: 0.1 10*3/uL (ref 0.0–0.5)
Eosinophils Relative: 3 %
Immature Granulocytes: 0 %
Lymphocytes Relative: 46 %
Lymphs Abs: 2 10*3/uL (ref 0.7–4.0)
Monocytes Absolute: 0.3 10*3/uL (ref 0.1–1.0)
Monocytes Relative: 7 %
Neutro Abs: 1.8 10*3/uL (ref 1.7–7.7)
Neutrophils Relative %: 43 %

## 2021-03-13 LAB — GASTROINTESTINAL PANEL BY PCR, STOOL (REPLACES STOOL CULTURE)

## 2021-03-13 LAB — COMPREHENSIVE METABOLIC PANEL
ALT: 42 U/L (ref 0–44)
AST: 27 U/L (ref 15–41)
Albumin: 2.3 g/dL — ABNORMAL LOW (ref 3.5–5.0)
Alkaline Phosphatase: 77 U/L (ref 38–126)
Anion gap: 5 (ref 5–15)
BUN: 26 mg/dL — ABNORMAL HIGH (ref 6–20)
CO2: 22 mmol/L (ref 22–32)
Calcium: 6.8 mg/dL — ABNORMAL LOW (ref 8.9–10.3)
Chloride: 111 mmol/L (ref 98–111)
Creatinine, Ser: 0.99 mg/dL (ref 0.44–1.00)
GFR, Estimated: >60 mL/min (ref >60)
Glucose, Bld: 131 mg/dL — ABNORMAL HIGH (ref 70–99)
Potassium: 4 mmol/L (ref 3.5–5.1)
Sodium: 138 mmol/L (ref 135–145)
Total Bilirubin: 0.4 mg/dL (ref 0.3–1.2)
Total Protein: 5.7 g/dL — ABNORMAL LOW (ref 6.5–8.1)

## 2021-03-13 LAB — PHOSPHORUS: Phosphorus: 1.9 mg/dL — ABNORMAL LOW (ref 2.5–4.6)

## 2021-03-13 LAB — CBC
HCT: 28 % — ABNORMAL LOW (ref 36.0–46.0)
Hemoglobin: 9.2 g/dL — ABNORMAL LOW (ref 12.0–15.0)
MCH: 29.9 pg (ref 26.0–34.0)
MCHC: 32.9 g/dL (ref 30.0–36.0)
MCV: 90.9 fL (ref 80.0–100.0)
Platelets: 164 10*3/uL (ref 150–400)
RBC: 3.08 MIL/uL — ABNORMAL LOW (ref 3.87–5.11)
RDW: 15.9 % — ABNORMAL HIGH (ref 11.5–15.5)
WBC: 4.2 10*3/uL (ref 4.0–10.5)
nRBC: 0 % (ref 0.0–0.2)

## 2021-03-13 LAB — PREALBUMIN: Prealbumin: 22.2 mg/dL (ref 18–38)

## 2021-03-13 LAB — C DIFFICILE QUICK SCREEN W PCR REFLEX
C Diff antigen: NEGATIVE
C Diff interpretation: NOT DETECTED
C Diff toxin: NEGATIVE

## 2021-03-13 LAB — MAGNESIUM: Magnesium: 1.8 mg/dL (ref 1.7–2.4)

## 2021-03-13 MED ORDER — DIPHENOXYLATE-ATROPINE 2.5-0.025 MG PO TABS
1.0000 | ORAL_TABLET | Freq: Four times a day (QID) | ORAL | Status: DC | PRN
  Administered 2021-03-13: 2 via ORAL
  Filled 2021-03-13: qty 2

## 2021-03-13 MED ORDER — LOPERAMIDE HCL 2 MG PO CAPS
2.0000 mg | ORAL_CAPSULE | ORAL | Status: DC | PRN
  Administered 2021-03-13: 2 mg via ORAL
  Filled 2021-03-13: qty 1

## 2021-03-13 MED ORDER — MAGNESIUM SULFATE 2 GM/50ML IV SOLN
2.0000 g | Freq: Once | INTRAVENOUS | Status: AC
  Administered 2021-03-13: 2 g via INTRAVENOUS
  Filled 2021-03-13: qty 50

## 2021-03-13 MED ORDER — SODIUM PHOSPHATES 45 MMOLE/15ML IV SOLN
30.0000 mmol | Freq: Once | INTRAVENOUS | Status: AC
  Administered 2021-03-13: 30 mmol via INTRAVENOUS
  Filled 2021-03-13: qty 10

## 2021-03-13 MED ORDER — CALCIUM GLUCONATE-NACL 2-0.675 GM/100ML-% IV SOLN
2.0000 g | Freq: Once | INTRAVENOUS | Status: AC
  Administered 2021-03-13: 2000 mg via INTRAVENOUS
  Filled 2021-03-13: qty 100

## 2021-03-13 NOTE — Evaluation (Signed)
Occupational Therapy Evaluation Patient Details Name: Caitlin Peters MRN: [id number] DOB: Nov 09, 1961 Today's Date: 03/13/2021    History of Present Illness 60 y.o. female who presented to ED with intractable nausea, vomiting and abdominal pain. Admitted 03/11/21  PMH: Crohn's disease, short gut syndrome s/p previous multiple small bowel resections followed by [facility] and now establish care with [facility], chronic TPN, history of discitis, hypertension, chronic pain syndrome, depression.   Clinical Impression   Pt is at baseline level of function with ADLs/selfcare and mobility. Pt loves with her daughter and has multiple family/relatives around to provide 24/7 assist as needed. All education completed and no further acute OT services are indicated at this time. OT will sign off    Follow Up Recommendations  No OT follow up;Supervision - Intermittent    Equipment Recommendations  None recommended by OT    Recommendations for Other Services       Precautions / Restrictions Precautions Precautions: Fall Restrictions Weight Bearing Restrictions: No      Mobility Bed Mobility Overal bed mobility: Modified Independent                  Transfers Overall transfer level: Needs assistance Equipment used: None Transfers: Sit to/from Stand Sit to Stand: Supervision         General transfer comment: supervision for safety    Balance Overall balance assessment: Mild deficits observed, not formally tested                                         ADL either performed or assessed with clinical judgement   ADL Overall ADL's : At baseline                                       General ADL Comments: Mod I - sup     Vision Baseline Vision/History: Wears glasses Patient Visual Report: No change from baseline       Perception     Praxis      Pertinent Vitals/Pain Pain Assessment: 0-10 Pain Score: 6  Pain Location:  generalized Pain Descriptors / Indicators: Aching;Sore Pain Intervention(s): Limited activity within patient's tolerance;Monitored during session;Premedicated before session;Repositioned     Hand Dominance Right   Extremity/Trunk Assessment Upper Extremity Assessment Upper Extremity Assessment: Overall WFL for tasks assessed   Lower Extremity Assessment Lower Extremity Assessment: Defer to PT evaluation   Cervical / Trunk Assessment Cervical / Trunk Assessment: Normal   Communication Communication Communication: No difficulties   Cognition Arousal/Alertness: Awake/alert Behavior During Therapy: WFL for tasks assessed/performed Overall Cognitive Status: Within Functional Limits for tasks assessed                                     General Comments  VSS on RA    Exercises     Shoulder Instructions      Home Living Family/patient expects to be discharged to:: Private residence Living Arrangements: Children;Other relatives Available Help at Discharge: Family;Available 24 hours/day Type of Home: Apartment Home Access: Stairs to enter Entrance Stairs-Number of Steps: 10 Entrance Stairs-Rails: Right Home Layout: One level     Bathroom Shower/Tub: Teacher, early years/pre: Standard Bathroom Accessibility: Yes   Home Equipment:  Walker - 4 wheels;Walker - 2 wheels;Hand held shower head          Prior Functioning/Environment Level of Independence: Needs assistance    ADL's / Homemaking Assistance Needed: Ind with ADLs/selfcare, supervision for bathing            OT Problem List: Impaired balance (sitting and/or standing);Decreased activity tolerance;Pain      OT Treatment/Interventions:      OT Goals(Current goals can be found in the care plan section) Acute Rehab OT Goals Patient Stated Goal: go home to be with dog OT Goal Formulation: With patient  OT Frequency:     Barriers to D/C:            Co-evaluation               AM-PAC OT "6 Clicks" Daily Activity     Outcome Measure Help from another person eating meals?: None Help from another person taking care of personal grooming?: None Help from another person toileting, which includes using toliet, bedpan, or urinal?: None Help from another person bathing (including washing, rinsing, drying)?: A Little Help from another person to put on and taking off regular upper body clothing?: None Help from another person to put on and taking off regular lower body clothing?: None 6 Click Score: 23   End of Session    Activity Tolerance: Patient tolerated treatment well Patient left: in bed;Other (comment) (sitting EOB)  OT Visit Diagnosis: Unsteadiness on feet (R26.81);Muscle weakness (generalized) (M62.81);History of falling (Z91.81);Pain Pain - part of body:  (generalized)                Time: 6063-0160 OT Time Calculation (min): 19 min Charges:  OT General Charges $OT Visit: 1 Visit OT Evaluation $OT Eval Moderate Complexity: 1 Mod    Britt Bottom 03/13/2021, 1:34 PM

## 2021-03-13 NOTE — Evaluation (Signed)
Physical Therapy Evaluation Patient Details Name: Caitlin Peters MRN: [id number] DOB: 1961-02-04 Today's Date: 03/13/2021   History of Present Illness  60 y.o. female who presented to ED with intractable nausea, vomiting and abdominal pain. 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    Clinical Impression  Patient evaluated by Physical Therapy with no further acute PT needs identified. All education has been completed and the patient has no further questions. Pt does not have any follow-up Physical Therapy or equipment needs. PT is signing off. Thank you for this referral.     Follow Up Recommendations No PT follow up;Supervision for mobility/OOB    Equipment Recommendations  Other (comment) (has necessary equipment)       Precautions / Restrictions Precautions Precautions: Fall Restrictions Weight Bearing Restrictions: No      Mobility  Bed Mobility Overal bed mobility: Modified Independent             General bed mobility comments: use of bed rail to pull to EoB    Transfers Overall transfer level: Needs assistance Equipment used: None Transfers: Sit to/from Stand Sit to Stand: Supervision         General transfer comment: supervision for safety  Ambulation/Gait Ambulation/Gait assistance: Supervision Gait Distance (Feet): 400 Feet Assistive device: Rolling walker (2 wheeled) Gait Pattern/deviations: Step-through pattern;Trunk flexed Gait velocity: WFL Gait velocity interpretation: >2.62 ft/sec, indicative of community ambulatory General Gait Details: supervision for safety and management of lines and leads. strong steady gait using RW,  Stairs Stairs: Yes Stairs assistance: Supervision Stair Management: One rail Right;Forwards;Alternating  pattern;Step to pattern Number of Stairs: 3 General stair comments: supervision for safety and management of lines and leads      Balance Overall balance assessment: Mild deficits observed, not formally tested                                           Pertinent Vitals/Pain Pain Assessment: 0-10 Pain Score: 7  Pain Location: generalized Pain Descriptors / Indicators: Aching;Sore Pain Intervention(s): Limited activity within patient's tolerance;Monitored during session;Repositioned;Premedicated before session    Home Living Family/patient expects to be discharged to:: Private residence Living Arrangements: Children;Other relatives Available Help at Discharge: Family;Available 24 hours/day Type of Home: Apartment Home Access: Stairs to enter Entrance Stairs-Rails: Right Entrance Stairs-Number of Steps: 10 Home Layout: One level Home Equipment: Walker - 4 wheels;Walker - 2 wheels;Hand held shower head      Prior Function Level of Independence: Needs assistance   Gait / Transfers Assistance Needed: Intermittent use of RW in home (rarely). Use of rollator in community.  ADL's / Homemaking Assistance Needed: supervision for bathing,        Hand Dominance   Dominant Hand: Right    Extremity/Trunk Assessment   Upper Extremity Assessment Upper Extremity Assessment: Defer to OT evaluation    Lower Extremity Assessment Lower Extremity Assessment: Generalized weakness    Cervical / Trunk Assessment Cervical / Trunk Assessment: Other exceptions (L2-L3 discitis, recent hospitalization for osteomyletis)  Communication   Communication: No difficulties  Cognition Arousal/Alertness: Awake/alert Behavior During Therapy: WFL for tasks assessed/performed Overall Cognitive Status: Within Functional Limits for tasks assessed  General Comments General comments (skin integrity, edema, etc.): VSS on RA     Exercises     Assessment/Plan    PT Assessment Patent does not need any further PT services  PT Problem List Decreased strength;Pain;Decreased activity tolerance           PT Goals (Current goals can be found in the Care Plan section)  Acute Rehab PT Goals Patient Stated Goal: go home to be with dog PT Goal Formulation: With patient Time For Goal Achievement: 03/26/21 Potential to Achieve Goals: Good     AM-PAC PT "6 Clicks" Mobility  Outcome Measure Help needed turning from your back to your side while in a flat bed without using bedrails?: None Help needed moving from lying on your back to sitting on the side of a flat bed without using bedrails?: None Help needed moving to and from a bed to a chair (including a wheelchair)?: None Help needed standing up from a chair using your arms (e.g., wheelchair or bedside chair)?: None Help needed to walk in hospital room?: None Help needed climbing 3-5 steps with a railing? : A Little 6 Click Score: 23    End of Session Equipment Utilized During Treatment: Gait belt Activity Tolerance: Patient tolerated treatment well Patient left: in bed;with call bell/phone within reach Nurse Communication: Mobility status PT Visit Diagnosis: Unsteadiness on feet (R26.81);Other abnormalities of gait and mobility (R26.89);Muscle weakness (generalized) (M62.81);History of falling (Z91.81);Pain Pain - part of body:  (generalized)    Time: 8184-0375 PT Time Calculation (min) (ACUTE ONLY): 28 min   Charges:   PT Evaluation $PT Eval Moderate Complexity: 1 Mod PT Treatments $Gait Training: 8-22 mins        Kiana Hollar B. Migdalia Dk PT, DPT Acute Rehabilitation Services Pager [phone] Office [phone]   [name] 03/13/2021, 10:13 AM

## 2021-03-13 NOTE — Progress Notes (Signed)
Feels much better-wants to go home.  No vomiting-complains of some reflux symptoms.  Tolerating clear liquids.  Diarrhea is back to her baseline-once her chronic Imodium/Lomotil therapy restarted.  She will follow-up with her primary care practitioner in a couple of days to follow-up GI pathogen panel.  She follows with pain management MD as well.  Since better-we will discharge home at own request-see discharge summary for details.

## 2021-03-13 NOTE — Progress Notes (Signed)
PHARMACY - TOTAL PARENTERAL NUTRITION CONSULT NOTE   Indication: Short bowel syndrome  Patient Measurements: Height: 5' 7"  (170.2 cm) Weight: 62 kg (136 lb 11 oz) IBW/kg (Calculated) : 61.6 TPN AdjBW (KG): 62 Body mass index is 21.41 kg/m.  Assessment:  42 YOF presented on 4/15 with intractable nausea, vomiting and abdominal pain for 2-3 days PTA.  CT on 4/15 showed no acute abnormality, fluid-filled bowel but no evidence of obstruction or inflammation.  PMH significant for Crohn's, GERD, and SGS post multiple SBR and on chronic TPN.  Teduglutide started January 2022 for short bowel syndrome.  Pharmacy consulted to continue TPN from PTA.  Glucose / Insulin: AM glucose < 180, no SSI needed Electrolytes: Phos 1.9 post KPhos 15 mmol, Mag low normal post 2gm, CoCa low at 8.16, others WNL Renal: SCr < 1, BUN 26 Hepatic: LFTs / tbili / TG WNL Intake / Output; MIVF: not charted, LBM 4/17, NS at 75 ml/hr GI Imaging: 4/16 XR - no bowel obstruction GI Surgeries / Procedures: none since TPN start  Central access: tunneled CVC double lumen placed 08/05/20 TPN start date: chronic TPN, inpatient start date 03/12/21  Nutritional Goals (per RD rec on 3/18): 0712-1975 kcal,95-105 grams AA, Fluid:>/= 2 L/day  Home TPN per [facility] record: 1800 mL TPN over 12 hours daily. Lipids on MWF only. 85 g protein/d and 7 day average 1292 kcal daily  Current Nutrition:  Clear liquid diet TPN  Plan:  Discharging today per MD - will not prepare a TPN bag NaPhos 19mol IV Mag sulfate 2gm IV Ca gluconate 2gm IV F/U AM if still here  Trista Ciocca D. DMina Marble PharmD, BCPS, [place]El [place] de Rob[place]4/18/2022, 9:20 AM

## 2021-03-13 NOTE — Progress Notes (Signed)
to be D/C'd home per MD order.  Discussed with the patient and all questions fully answered.   VSS, Skin clean, dry and intact without evidence of skin break down, no evidence of skin tears noted.  An After Visit Summary was printed and given to the patient. Patient received prescription.   D/c education completed with patient/family including follow up instructions, medication list, d/c activities limitations if indicated, with other d/c instructions as indicated by MD - patient able to verbalize understanding, all questions fully answered.    Patient instructed to return to ED, call 911, or call MD for any changes in condition.    Patient escorted via [place], and D/C home via private auto.   Thane Edu RN

## 2021-03-13 NOTE — Discharge Summary (Signed)
PATIENT DETAILS Name: Caitlin Peters Age: 60 y.o. Sex: female Date of Birth: 03-07-1961 MRN: [id number]. Admitting Physician: Jonetta Osgood, MD [id number] Everardo Beals, MD  Admit Date: 03/10/2021 Discharge date: 03/13/2021  Recommendations for Outpatient Follow-up:  1. Follow up with PCP in 1-2 weeks 2. Please obtain CMP/CBC in one week 3. Please follow up on stool GI pathogen panel-pending.  Admitted From:  Home  Disposition: Home with home health services   Home Health: Yes  Equipment/Devices: None  Discharge Condition: Stable  CODE STATUS: FULL CODE  Diet recommendation:  Diet Order            DIET SOFT Room service appropriate? Yes; Fluid consistency: Thin  Diet effective now           Diet - low sodium heart healthy                  Brief Narrative: Patient is a 60 y.o. 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  Patient was admitted for  Significant events: 3/17-3/23>> admit for back pain-found to have T2-T5 discitis-discharged on Dapto/cefepime-until 4/29 4/15>> admit to [facility] for intractable nausea/vomiting/worsening diarrhea/abdominal pain.  Significant studies: 4/15>> CT abdomen/pelvis: No acute abnormality  Antimicrobial therapy: Continue daptomycin-stop date 4/29 Continue cefepime-stop date 4/29  Microbiology data: 4/16>> urine culture: No growth 4/16>> Covid PCR: Negative 4/18>> C. difficile PCR: Negative  Procedures : None  Consults: None  Brief Hospital Course: Intractable nausea/vomiting/diarrhea/abdominal pain: Unclear etiology-could have been a viral syndrome-managed with supportive care-feels much better and is actually asking for discharge today.  She did not have BM at  all yesterday-however she is starting to have more BMs-and thinks that she is back to having her chronic diarrhea which she has at baseline.  She has chronic pain-which is controlled with her current regimen of Dilaudid and methadone.  She will be resumed on her usual regimen of Imodium/Lomotil-and will continue Gattex at home.  C. difficile PCR was negative.  Tolerating advancement in diet.    Recent hospitalization for T2-T5 discitis/osteomyelitis: Continue daptomycin/cefepime as previous-stop date 4/29.  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    Resume TNA on discharge.   Hypophosphatemia/hypomagnesemia: Will be repleted prior to discharge per pharmacy protocol.  Repeat electrolytes in 1 week.  Normocytic anemia: Due to chronic disease-anemia is mild-stable for periodic follow-up.  Seizure disorder: Continue Keppra  Chronic pain syndrome: Continue methadone-Dilaudid-claims she will follow with pain management MD on discharge.  HTN: BP relatively stable-continue amlodipine, hydralazine and metoprolol.  CKD stage IIIa: Creatinine close to baseline  Depression: Stable-continue Wellbutrin/Cymbalta  Deconditioning: Better-thinks she is back to her baseline-claims she uses a walker at home.   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   Diarrhea   Short gut syndrome   HTN (hypertension), benign   Chronic pain syndrome   Abdominal pain   Methadone dependence ([name])   CKD (chronic kidney disease) stage 3, GFR 30-59 ml/min ([name])   Seizure [facility])   Thoracic discitis   Discharge Instructions:  Activity:  As tolerated with Full fall precautions  use walker/cane & assistance as  needed  Discharge Instructions    Call MD for:  persistant nausea and vomiting   Complete by: As directed    Call MD for:  severe uncontrolled pain   Complete by: As directed    Diet - low sodium heart healthy   Complete by: As directed    Discharge instructions   Complete by: As directed    Follow with Primary MD  [name], [name], MD in 1-2 weeks  GI pathogen panel pending-please ask your primary care practitioner to follow-up these results.  Please get a complete blood count and chemistry panel checked by your Primary MD at your next visit, and again as instructed by your Primary MD.  Get Medicines reviewed and adjusted: Please take all your medications with you for your next visit with your Primary MD  Laboratory/radiological data: Please request your Primary MD to go over all hospital tests and procedure/radiological results at the follow up, please ask your Primary MD to get all Hospital records sent to his/her office.  In some cases, they will be blood work, cultures and biopsy results pending at the time of your discharge. Please request that your primary care M.D. follows up on these results.  Also Note the following: If you experience worsening of your admission symptoms, develop shortness of breath, life threatening emergency, suicidal or homicidal thoughts you must seek medical attention immediately by calling 911 or calling your MD immediately  if symptoms less severe.  You must read complete instructions/literature along with all the possible adverse reactions/side effects for all the Medicines you take and that have been prescribed to you. Take any new Medicines after you have completely understood and accpet all the possible adverse reactions/side effects.   Do not drive when taking Pain medications or sleeping medications (Benzodaizepines)  Do not take more than prescribed Pain, Sleep and Anxiety Medications. It is not advisable to combine anxiety,sleep and  pain medications without talking with your primary care practitioner  Special Instructions: If you have smoked or chewed Tobacco  in the last 2 yrs please stop smoking, stop any regular Alcohol  and or any Recreational drug use.  Wear Seat belts while driving.  Please note: You were cared for by a hospitalist during your hospital stay. Once you are discharged, your primary care physician will handle any further medical issues. Please note that NO REFILLS for any discharge medications will be authorized once you are discharged, as it is imperative that you return to your primary care physician (or establish a relationship with a primary care physician if you do not have one) for your post hospital discharge needs so that they can reassess your need for medications and monitor your lab values.   Increase activity slowly   Complete by: As directed      Allergies as of 03/13/2021      Reactions   Meperidine Hives   Other reaction(s): GI Upset Due to Chrones   Hyoscyamine Hives, Swelling   Legs swelling Disorientation   Cefepime Other (See Comments)   Neurotoxicity occurring in setting of AKI. Ceftriaxone tolerated during same admit   Gabapentin Other (See Comments)   unknown   Lyrica [pregabalin] Other (See Comments)   unknown   Topamax [topiramate] Other (See Comments)   unknown   Zosyn [piperacillin Sod-tazobactam So]    Patient reports it makes her vomit, her neck stiff, and her "heart feel funny"   Fentanyl Rash   Pt is allergic  to fentanyl patch related to the glue (gives her a rash) Pt states she is NOT allergic to fentanyl IV medicine   Morphine And Related Rash      Medication List    TAKE these medications   acetaminophen 325 MG tablet Commonly known as: TYLENOL Take 650 mg by mouth every 6 (six) hours as needed for mild pain.   amLODipine 10 MG tablet Commonly known as: NORVASC Take 1 tablet (10 mg total) by mouth daily.   budesonide 3 MG 24 hr capsule Commonly  known as: ENTOCORT EC Take 3 capsules (9 mg total) by mouth daily. What changed:   how much to take  when to take this   buPROPion 100 MG 12 hr tablet Commonly known as: WELLBUTRIN SR Take 200 mg by mouth daily.   Calcium 200 MG Tabs Take 200 mg by mouth daily.   ceFEPime  IVPB Commonly known as: MAXIPIME Inject 2 g into the vein every 12 (twelve) hours. Indication:  Discitis/osteomyelitis First Dose: Yes Last Day of Therapy:  03/24/2021 Labs - Once weekly:  CBC/D and BMP, Labs - Every other week:  ESR and CRP Method of administration: IV Push Method of administration may be changed at the discretion of home infusion pharmacist based upon assessment of the patient and/or caregiver's ability to self-administer the medication ordered.   cholecalciferol 25 MCG (1000 UNIT) tablet Commonly known as: VITAMIN D3 Take 1,000 Units by mouth daily.   cycloSPORINE 0.05 % ophthalmic emulsion Commonly known as: RESTASIS Place 1 drop into both eyes 2 (two) times daily.   daptomycin  IVPB Commonly known as: CUBICIN Inject 600 mg into the vein daily. Indication: discitis/osteomyelitis First Dose: Yes Last Day of Therapy:  03/24/2021 Labs - Once weekly:  CBC/D, BMP, and CPK Labs - Every other week:  ESR and CRP Method of administration: IV Push Method of administration may be changed at the discretion of home infusion pharmacist based upon assessment of the patient and/or caregiver's ability to self-administer the medication ordered.   dexlansoprazole 60 MG capsule Commonly known as: Dexilant Take 1 capsule (60 mg total) by mouth daily.   Dextran 70-Hypromellose 0.1-0.3 % Soln Place 1 drop into both eyes 4 (four) times daily.   diphenoxylate-atropine 2.5-0.025 MG tablet Commonly known as: LOMOTIL TAKE 1 TABLET BY MOUTH 4 TIMES DAILY AS NEEDED FOR DIARRHEA OR  LOOSE  STOOLS What changed:   how much to take  how to take this  when to take this  reasons to take  this  additional instructions   DULoxetine 30 MG capsule Commonly known as: CYMBALTA Take 3 capsules (90 mg total) by mouth daily.   estradiol 2 MG tablet Commonly known as: ESTRACE Take 1 tablet (2 mg total) by mouth daily.   Gattex 5 MG Kit Generic drug: Teduglutide (rDNA) Inject 5 mLs into the skin daily.   hydrALAZINE 50 MG tablet Commonly known as: APRESOLINE Take 1.5 tablets (75 mg total) by mouth 3 (three) times daily.   HYDROmorphone 4 MG tablet Commonly known as: DILAUDID Take 4 mg by mouth 4 (four) times daily as needed for moderate pain.   levETIRAcetam 500 MG tablet Commonly known as: KEPPRA Take 1 tablet (500 mg total) by mouth 2 (two) times daily.   lipase/protease/amylase 36000 UNITS Cpep capsule Commonly known as: Creon Take 1 capsule (36,000 Units total) by mouth 3 (three) times daily as needed (with meals for digestion).   loperamide 2 MG capsule Commonly known as: IMODIUM TAKE  1 CAPSULE BY MOUTH AS NEEDED FOR DIARRHEA OR LOOSE STOOLS   methadone 5 MG tablet Commonly known as: DOLOPHINE Take 1 tablet (5 mg total) by mouth 5 (five) times daily. What changed: when to take this   metoprolol tartrate 100 MG tablet Commonly known as: LOPRESSOR Take 1 tablet (100 mg total) by mouth 2 (two) times daily.   MULTIVITAMIN ADULT PO Take 1 tablet by mouth daily.   Narcan 4 MG/0.1ML Liqd nasal spray kit Generic drug: naloxone Place 1 spray into the nose once as needed (overdose).   PRESCRIPTION MEDICATION Inject 1 each into the vein daily. Home TPN . [facility] . 1 bag for 12 hours. [phone]   PROBIOTIC-10 PO Take 1 capsule by mouth daily.   promethazine 25 MG tablet Commonly known as: PHENERGAN Take 1 tablet (25 mg total) by mouth every 6 (six) hours as needed for nausea or vomiting.   sodium chloride 0.9 % infusion Inject 1 mL into the vein daily as needed (flush).   sucralfate 1 GM/10ML suspension Commonly  known as: Carafate Take 10 mLs (1 g total) by mouth 4 (four) times daily -  with meals and at bedtime.   TRALEMENT IV Inject 1 mL into the vein See admin instructions. Used in TPN bag 4 times weekly   vitamin B-12 100 MCG tablet Commonly known as: CYANOCOBALAMIN Take 1,000 mcg by mouth daily.       Follow-up Information    [name], [name], MD. Schedule an appointment as soon as possible for a visit in 1 week(s).   Specialty: Internal Medicine Contact information: [place] [address] [phone]              Allergies  Allergen Reactions  . Meperidine Hives    Other reaction(s): GI Upset Due to Chrones   . Hyoscyamine Hives and Swelling    Legs swelling   Disorientation  . Cefepime Other (See Comments)    Neurotoxicity occurring in setting of AKI. Ceftriaxone tolerated during same admit  . Gabapentin Other (See Comments)    unknown  . Lyrica [Pregabalin] Other (See Comments)    unknown  . Topamax [Topiramate] Other (See Comments)    unknown  . Zosyn [Piperacillin Sod-Tazobactam So]     Patient reports it makes her vomit, her neck stiff, and her "heart feel funny"  . Fentanyl Rash    Pt is allergic to fentanyl patch related to the glue (gives her a rash) Pt states she is NOT allergic to fentanyl IV medicine  . Morphine And Related Rash     Other Procedures/Studies: DG Abd 1 View  Result Date: 03/11/2021 CLINICAL DATA:  Left lower quadrant and low back pain for 4 days. Crohn disease. EXAM: ABDOMEN - 1 VIEW COMPARISON:  Plain film and CT of 1 day prior. FINDINGS: Surgical sutures within the abdomen bilaterally. No gaseous distention of bowel loops. No gross free intraperitoneal air. Right femoral catheter terminates over the right hemidiaphragm. Contrast within the bladder. Convex left lumbar spine curvature. IMPRESSION: No evidence of bowel obstruction or acute process. Electronically Signed   By: Abigail Miyamoto M.D.   On:  03/11/2021 16:02   DG Abdomen 1 View  Result Date: 03/10/2021 CLINICAL DATA:  60 year old female with right femoral catheter, tunneled catheter in right leg. Crohn disease. EXAM: ABDOMEN - 1 VIEW COMPARISON:  Abdominal radiographs 03/08/2021 and earlier. FINDINGS: Portable AP supine view at at 0939 hours. Right femoral  approach vascular catheter has stable configuration in the right inguinal region from 2 days ago. It tracks cephalad, with the tip at the level of the hepatic IVC. No kinking or discontinuity identified. Non obstructed bowel gas pattern. Multiple bowel staple lines in the bilateral abdomen. Stable visualized osseous structures. Levoconvex lumbar scoliosis. IMPRESSION: 1. Right femoral approach vascular catheter terminates at the hepatic IVC level. No adverse features identified. 2. Postoperative changes to the abdomen. Nonobstructed bowel-gas pattern. Electronically Signed   By: Genevie Ann M.D.   On: 03/10/2021 10:10   CT Abdomen Pelvis W Contrast  Result Date: 03/10/2021 CLINICAL DATA:  60 year old with nausea, vomiting and abdominal pain. History of Crohn's disease. EXAM: CT ABDOMEN AND PELVIS WITH CONTRAST TECHNIQUE: Multidetector CT imaging of the abdomen and pelvis was performed using the standard protocol following bolus administration of intravenous contrast. CONTRAST:  121m OMNIPAQUE IOHEXOL 300 MG/ML  SOLN COMPARISON:  02/09/2021 FINDINGS: Lower chest: Lung bases are clear.  No pleural effusions. Hepatobiliary: Normal appearance of the liver. The gallbladder is absent. Main portal venous system is patent. Extrahepatic bile duct measures roughly 6 mm. No significant biliary dilatation. Pancreas: Mild pancreatic duct dilatation is stable. No peripancreatic inflammation. Spleen: Normal in size without focal abnormality. Adrenals/Urinary Tract: Normal adrenal glands. Normal appearance of the kidneys. No hydronephrosis. No suspicious renal lesions. Moderate distention of the urinary bladder.  Stomach/Bowel: Multiple surgical changes throughout the bowel. Scattered fluid-filled loops of bowel, largest in the left lateral abdomen. Overall, nonobstructive bowel gas pattern. No focal bowel inflammation. Vascular/Lymphatic: Aorta and main visceral arteries are patent. Mild atherosclerotic disease in abdominal aorta. Negative for an abdominal aortic aneurysm. Central venous catheter enters the right common femoral vein and terminates in the upper IVC just below the inferior cavoatrial junction. No significant lymph node enlargement in the abdomen or pelvis. Reproductive: Status post hysterectomy. No adnexal masses. Other: Negative for ascites.  Negative for free air. Musculoskeletal: Irregularity involving the right iliac crest is similar to the previous examination. Degenerative facet disease at L5-S1. No acute bone abnormality. IMPRESSION: 1. No acute abnormality in the abdomen or pelvis. 2. Extensive postsurgical changes involving the bowel. Scattered areas of fluid-filled bowel but no evidence for obstruction and no focal bowel inflammation. 3. Right femoral central line that terminates in the upper IVC just below the inferior cavoatrial junction. Electronically Signed   By: AMarkus DaftM.D.   On: 03/10/2021 11:13   DG Abd 2 Views  Result Date: 03/08/2021 CLINICAL DATA:  Nausea and vomiting. c/o left sided abdominal pain, nausea and emesis x 3 days. Hx of Crohn's, Pt states issues similar to prior flares. Taking ABX for spinal infection. EXAM: ABDOMEN - 2 VIEW COMPARISON:  X-ray abdomen 02/14/2021 FINDINGS: Nonspecific air-fluid levels on the upright view. No definite dilatation of the small or large bowel. Multiple and Modic bowel sutures are noted. No definite free intraperitoneal gas on the upright view. Phleboliths overlie the pelvis. No radio-opaque calculi or other significant radiographic abnormality is seen. Right femoral approach central catheter with tip terminating over the expected region of  the inferior vena cava. Visualized lower thoraces are unremarkable.  No pleural effusions. IMPRESSION: Nonspecific air-fluid levels with otherwise a nonobstructive bowel gas pattern. Electronically Signed   By: MIven FinnM.D.   On: 03/08/2021 15:21   DG Abd Portable 1V  Result Date: 02/14/2021 CLINICAL DATA:  Abdominal pain EXAM: PORTABLE ABDOMEN - 1 VIEW COMPARISON:  10/22/2020 FINDINGS: Scattered large and small bowel gas is noted. Postsurgical  changes are seen. Tunneled PICC line is noted from the right femoral approach. No free air is seen. No obstructive changes are noted. IMPRESSION: No acute abnormality seen. Electronically Signed   By: Inez Catalina M.D.   On: 02/14/2021 14:44     TODAY-DAY OF DISCHARGE:  Subjective:   Caitlin Peters today has no headache,no chest abdominal pain,no new weakness tingling or numbness, feels much better wants to go home today.   Objective:   Blood pressure (!) 153/84, pulse 74, temperature 98.3 F (36.8 C), temperature source Oral, resp. rate 17, height _0  (1.702 m), weight 62 kg, SpO2 100 %.  Intake/Output Summary (Last 24 hours) at 03/13/2021 1006 Last data filed at 03/13/2021 0912 Gross per 24 hour  Intake 4513.75 ml  Output --  Net 4513.75 ml   Filed Weights   03/10/21 2311  Weight: 62 kg    Exam: Awake Alert, Oriented *3, No new F.N deficits, Normal affect [place].AT,PERRAL Supple Neck,No JVD, No cervical lymphadenopathy appriciated.  Symmetrical Chest wall movement, Good air movement bilaterally, CTAB RRR,No Gallops,Rubs or new Murmurs, No Parasternal Heave +ve B.Sounds, Abd Soft, Non tender, No organomegaly appriciated, No rebound -guarding or rigidity. No Cyanosis, Clubbing or edema, No new Rash or bruise   PERTINENT RADIOLOGIC STUDIES: DG Abd 1 View  Result Date: 03/11/2021 CLINICAL DATA:  Left lower quadrant and low back pain for 4 days. Crohn disease. EXAM: ABDOMEN - 1 VIEW COMPARISON:  Plain film and CT of 1 day prior.  FINDINGS: Surgical sutures within the abdomen bilaterally. No gaseous distention of bowel loops. No gross free intraperitoneal air. Right femoral catheter terminates over the right hemidiaphragm. Contrast within the bladder. Convex left lumbar spine curvature. IMPRESSION: No evidence of bowel obstruction or acute process. Electronically Signed   By: Abigail Miyamoto M.D.   On: 03/11/2021 16:02     PERTINENT LAB RESULTS: CBC: Recent Labs    03/12/21 0453 03/13/21 0404  WBC 4.7 4.2  HGB 10.1* 9.2*  HCT 31.1* 28.0*  PLT 174 164   CMET CMP     Component Value Date/Time   NA 138 03/13/2021 0404   K 4.0 03/13/2021 0404   CL 111 03/13/2021 0404   CO2 22 03/13/2021 0404   GLUCOSE 131 (H) 03/13/2021 0404   BUN 26 (H) 03/13/2021 0404   CREATININE 0.99 03/13/2021 0404   CALCIUM 6.8 (L) 03/13/2021 0404   PROT 5.7 (L) 03/13/2021 0404   ALBUMIN 2.3 (L) 03/13/2021 0404   AST 27 03/13/2021 0404   ALT 42 03/13/2021 0404   ALKPHOS 77 03/13/2021 0404   BILITOT 0.4 03/13/2021 0404   GFRNONAA >60 03/13/2021 0404   GFRAA 55 (L) 08/12/2020 0513    GFR Estimated Creatinine Clearance: 59.5 mL/min (by C-G formula based on SCr of 0.99 mg/dL). Recent Labs    03/11/21 0005  LIPASE 39   Recent Labs    03/12/21 0453  CKTOTAL 84   Invalid input(s): POCBNP No results for input(s): DDIMER in the last 72 hours. No results for input(s): HGBA1C in the last 72 hours. Recent Labs    03/13/21 0404  TRIG 85   No results for input(s): TSH, T4TOTAL, T3FREE, THYROIDAB in the last 72 hours.  Invalid input(s): FREET3 No results for input(s): VITAMINB12, FOLATE, FERRITIN, TIBC, IRON, RETICCTPCT in the last 72 hours. Coags: No results for input(s): INR in the last 72 hours.  Invalid input(s): PT Microbiology: Recent Results (from the past 240 hour(s))  Urine culture  Status: None   Collection Time: 03/11/21  2:00 PM   Specimen: Urine, Random  Result Value Ref Range Status   Specimen Description  URINE, RANDOM  Final   Special Requests NONE  Final   Culture   Final    NO GROWTH Performed at [facility], [address] [place], [place] [address]    Report Status 03/12/2021 FINAL  Final  SARS CORONAVIRUS 2 (TAT 6-24 HRS) Nasopharyngeal Nasopharyngeal Swab     Status: None   Collection Time: 03/11/21  6:36 PM   Specimen: Nasopharyngeal Swab  Result Value Ref Range Status   SARS Coronavirus 2 NEGATIVE NEGATIVE Final    Comment: (NOTE) SARS-CoV-2 target nucleic acids are NOT DETECTED.  The SARS-CoV-2 RNA is generally detectable in upper and lower respiratory specimens during the acute phase of infection. Negative results do not preclude SARS-CoV-2 infection, do not rule out co-infections with other pathogens, and should not be used as the sole basis for treatment or other patient management decisions. Negative results must be combined with clinical observations, patient history, and epidemiological information. The expected result is Negative.  Fact Sheet for Patients: SugarRoll.be  Fact Sheet for Healthcare Providers: https://www.woods-mathews.com/  This test is not yet approved or cleared by the [place] FDA and  has been authorized for detection and/or diagnosis of SARS-CoV-2 by FDA under an Emergency Use Authorization (EUA). This EUA will remain  in effect (meaning this test can be used) for the duration of the COVID-19 declaration under Se ction 564(b)(1) of the Act, 21 U.S.C. section 360bbb-3(b)(1), unless the authorization is terminated or revoked sooner.  Performed at [facility], [place] [address] [place], [place] [address]   MRSA PCR Screening     Status: None   Collection Time: 03/11/21 10:13 PM   Specimen: Nasal Mucosa; Nasopharyngeal  Result Value Ref Range Status   MRSA by PCR NEGATIVE NEGATIVE Final    Comment:        The GeneXpert MRSA Assay (FDA approved for NASAL specimens only), is one  component of a comprehensive MRSA colonization surveillance program. It is not intended to diagnose MRSA infection nor to guide or monitor treatment for MRSA infections. Performed at [facility], [place] [address], [place], [place] [address]   C Difficile Quick Screen w PCR reflex     Status: None   Collection Time: 03/13/21  5:31 AM   Specimen: Stool  Result Value Ref Range Status   C Diff antigen NEGATIVE NEGATIVE Final   C Diff toxin NEGATIVE NEGATIVE Final   C Diff interpretation No C. difficile detected.  Final    Comment: Performed at [facility], [place] [address], [place], [place] [address]    FURTHER DISCHARGE INSTRUCTIONS:  Get Medicines reviewed and adjusted: Please take all your medications with you for your next visit with your Primary MD  Laboratory/radiological data: Please request your Primary MD to go over all hospital tests and procedure/radiological results at the follow up, please ask your Primary MD to get all Hospital records sent to his/her office.  In some cases, they will be blood work, cultures and biopsy results pending at the time of your discharge. Please request that your primary care M.D. goes through all the records of your hospital data and follows up on these results.  Also Note the following: If you experience worsening of your admission symptoms, develop shortness of breath, life threatening emergency, suicidal or homicidal thoughts you must seek  medical attention immediately by calling 911 or calling your MD immediately  if symptoms less severe.  You must read complete instructions/literature along with all the possible adverse reactions/side effects for all the Medicines you take and that have been prescribed to you. Take any new Medicines after you have completely understood and accpet all the possible adverse reactions/side effects.   Do not drive when taking Pain medications or sleeping medications (Benzodaizepines)  Do not take  more than prescribed Pain, Sleep and Anxiety Medications. It is not advisable to combine anxiety,sleep and pain medications without talking with your primary care practitioner  Special Instructions: If you have smoked or chewed Tobacco  in the last 2 yrs please stop smoking, stop any regular Alcohol  and or any Recreational drug use.  Wear Seat belts while driving.  Please note: You were cared for by a hospitalist during your hospital stay. Once you are discharged, your primary care physician will handle any further medical issues. Please note that NO REFILLS for any discharge medications will be authorized once you are discharged, as it is imperative that you return to your primary care physician (or establish a relationship with a primary care physician if you do not have one) for your post hospital discharge needs so that they can reassess your need for medications and monitor your lab values.  Total Time spent coordinating discharge including counseling, education and face to face time equals 35 minutes.  SignedOren Binet 03/13/2021 10:06 AM

## 2021-03-13 NOTE — TOC Transition Note (Signed)
Transition of Care [facility]) - CM/SW Discharge Note   Patient Details  Name: KARYNN DEBLASI MRN: [id number] Date of Birth: 1961-05-24  Transition of [facility]) CM/SW Contact:  Ninfa Meeker, RN Phone Number: 03/13/2021, 10:56 AM   Clinical Narrative:  Case manager spoke with patient concerning discharge plans.She will return home and resume prior home Health service with [facility] for TNA and [facility]. CM confirmed and notified Carolynn Sayers, IV specialist with [facility]. No further needs identified.    Final next level of care: Home w Home Health Services Barriers to Discharge: No Barriers Identified   Patient Goals and CMS Choice Patient states their goals for this hospitalization and ongoing recovery are:: return home   Choice offered to / list presented to :  (patient resuming previous services)  Discharge Placement                       Discharge Plan and Services   Discharge Planning Services: CM Consult Post Acute Care Choice: Home Health,Resumption of Svcs/PTA Provider          DME Arranged: N/A DME Agency: NA       HH Arranged: RN [place] Agency: [facility]        Social Determinants of Health (SDOH) Interventions     Readmission Risk Interventions Readmission Risk Prevention Plan 02/10/2021 12/21/2020 11/09/2020  Transportation Screening Complete Complete Complete  PCP or Specialist Appt within 3-5 Days - - -  Not Complete comments - - -  HRI or [place] Work Consult for [place] Planning/Counseling - - -  SW consult not completed comments - - -  Palliative Care Screening - - -  Medication Review Press photographer) Complete - Complete  PCP or Specialist appointment within 3-5 days of discharge - Complete Complete  HRI or Home Care Consult Complete Complete Complete  SW Recovery Care/Counseling Consult Complete Complete -  Palliative Care Screening Not Applicable - Not [place] Not Applicable -  Not Applicable

## 2021-03-14 ENCOUNTER — Telehealth: Payer: Self-pay

## 2021-03-14 NOTE — Telephone Encounter (Cosign Needed)
Transition Care Management Follow-up Telephone Call  Date of discharge and from where: [facility]/13/2021  [facility]   How have you been since you were released from the hospital? Not Well, Im still in pain and need pain medicine  Any questions or concerns? Yes, I need to rest   Items Reviewed:  Did the pt receive and understand the discharge instructions provided? Yes  per pt she understands and states she had these abdominal attacks and knows how to handle the pain   Medications obtained and verified? Yes   Other? Yes   Any new allergies since your discharge? No   Dietary orders reviewed? No  Do you have support at home? Yes   Home Care and Equipment/Supplies: Were home health services ordered? no If so, what is the name of the agency? n/a  Has the agency set up a time to come to the patient's home? not applicable Were any new equipment or medical supplies ordered?  No What is the name of the medical supply agency? n/a Were you able to get the supplies/equipment? not applicable Do you have any questions related to the use of the equipment or supplies? No  Pt states she is still experiencing abdominal pain , advised pt she should report back to the Er to be evaluated.  Pt declines states they cannot help me. Suggested she may call 911 for help, pt began telling me that they cannot help her stomach.  Her family is with her currently and pt states they will help her feel better. Again suggested pt call 911 to seek medical attention, pt declined. Offered pt to schedule 1 week follow up visit with PCP , pt declines states she needs time before she can schedule.  Pt states she will call schedule her an appointment when she speaks with her family.    Functional Questionnaire: (I = Independent and D = Dependent) ADLs: d    Bathing/Dressing- d  Meal Prep-d  Eating- d  Maintaining continence- i  Transferring/Ambulation- d  Managing Meds- d  Follow up appointments reviewed:   PCP  Hospital f/u appt confirmed? No   Pt declines to schedule   Specialist Hospital f/u appt confirmed? No    Are transportation arrangements needed? No   If their condition worsens, is the pt aware to call PCP or go to the Emergency Dept.? Yes  Was the patient provided with contact information for the PCP's office or ED? Yes  Was to pt encouraged to call back with questions or concerns? Yes

## 2021-03-21 ENCOUNTER — Emergency Department (HOSPITAL_COMMUNITY)
Admission: EM | Admit: 2021-03-21 | Discharge: 2021-03-21 | Disposition: A | Discharge status: Home / Self Care | Payer: Medicare Other | Attending: Emergency Medicine | Admitting: Emergency Medicine

## 2021-03-21 ENCOUNTER — Encounter (HOSPITAL_COMMUNITY): Payer: Self-pay | Admitting: *Deleted

## 2021-03-21 DIAGNOSIS — N183 Chronic kidney disease, stage 3 unspecified: Secondary | ICD-10-CM | POA: Insufficient documentation

## 2021-03-21 DIAGNOSIS — I129 Hypertensive chronic kidney disease with stage 1 through stage 4 chronic kidney disease, or unspecified chronic kidney disease: Secondary | ICD-10-CM | POA: Diagnosis not present

## 2021-03-21 DIAGNOSIS — T80211A Bloodstream infection due to central venous catheter, initial encounter: Secondary | ICD-10-CM | POA: Diagnosis present

## 2021-03-21 DIAGNOSIS — Z87891 Personal history of nicotine dependence: Secondary | ICD-10-CM | POA: Diagnosis not present

## 2021-03-21 DIAGNOSIS — Z79899 Other long term (current) drug therapy: Secondary | ICD-10-CM | POA: Diagnosis not present

## 2021-03-21 DIAGNOSIS — Z789 Other specified health status: Secondary | ICD-10-CM

## 2021-03-21 DIAGNOSIS — Z8616 Personal history of COVID-19: Secondary | ICD-10-CM | POA: Insufficient documentation

## 2021-03-21 MED ORDER — HEPARIN SOD (PORK) LOCK FLUSH 100 UNIT/ML IV SOLN: 250.0000 [IU] | INTRAVENOUS | Status: DC | PRN

## 2021-03-21 NOTE — ED Notes (Signed)
An After Visit Summary was printed and given to the patient. Discharge instructions given and no further questions at this time.  

## 2021-03-21 NOTE — ED Triage Notes (Signed)
Emergency Medicine Provider Triage Evaluation Note  PHARRAH ROTTMAN , a 60 y.o. female  was evaluated in triage.  Pt complains of bleeding out of her right thigh tunneled line.  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  She states that this morning she was pushing some antibiotics through the line and noticed that there was blood coming out of one of the ports.  She denies any pain, fevers, chills.  She also reports that the line is not pulling back like it normally does.  Denies abdominal pain, nausea or vomiting.  Review of Systems  Positive: As above Negative: Fevers, chills, abdominal pain, nausea, vomiting  Physical Exam  BP (!) 145/80 (BP Location: Left Arm)   Pulse 75   Temp 98.6 F (37 C) (Oral)   Resp 14   Ht 5' 7"  (1.702 m)   Wt 62.1 kg   SpO2 100%   BMI 21.46 kg/m  Gen:   Awake, no distress   HEENT:  Atraumatic  Resp:  Normal effort  Cardiac:  Normal rate  Abd:   Nondistended, nontender  MSK:   Moves extremities without difficulty, port in right thigh without evidence of infection, no surrounding bleeding. Neuro:  Speech clear  Medical Decision Making  Medically screening exam initiated at 12:17 PM.  Appropriate orders placed.  KHARTER SESTAK was informed that the remainder of the evaluation will be completed by another provider, this initial triage assessment does not replace that evaluation, and the importance of remaining in the ED until their evaluation is complete.  Clinical Impression  Patient is medically stable for further evaluation by provider   Garald Balding, PA-C 03/21/21 1219

## 2021-03-21 NOTE — ED Triage Notes (Signed)
Pt noticed blood from the venous access on her right femoral site after flushing it this morning. She is receiving abx and TPN through the site.

## 2021-03-21 NOTE — ED Provider Notes (Signed)
[facility] DEPT Provider Note   CSN: [id number] Arrival date & time: 03/21/21  1149     History Chief Complaint  Patient presents with  . Vascular Access Problem    Caitlin Peters is a 60 y.o. female.  HPI 60 year old female with a history of Crohn's disease with longstanding steroid use, short gut syndrome, with right femoral tunneled catheter, currently receiving cefepime and TPN presents to the ER with complaints of blood in the line.  Patient reports that she first noticed this this morning.  No pain, fevers, chills, no signs of infection.  She states that this line has been overall working well for her, however she is concerned given the blood that she noticed that was in the line.  Also feels as though it is not pulling back as it normally does.    Past Medical History:  Diagnosis Date  . Acute pancreatitis 04/13/2020  . Anasarca 10/2019  . AVN (avascular necrosis of bone) ([place])   . Cataract   . Choledocholithiasis (sludge) s/p ERCP 10/2019 10/21/2020  . Chronic pain syndrome   . CKD (chronic kidney disease), stage III ([place])   . Crohn disease ([place])   . Crohn's disease of small & large intestine with SGS 1984   Caitlin Peters is a 60year old female with a history of Crohn's disease diagnosed in 71 (age 51), history of long-term steroid use with osteoporosis, S/P multiple bowel resections ([id number]) complicated by chronic back and abdominal pain, steatorrhea and short bowel syndrome. The patient has been left with ~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 attached to proximal transverse colon through rectum. She has been  . Depression   . Diverticulosis   . GERD (gastroesophageal reflux disease)   . HTN (hypertension)   . IDA (iron deficiency anemia)   . Malnutrition ([place])   . Mass in chest   . Osteoporosis   . Osteoporosis 12/24/2014  . Pancreatitis   . 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 Vitamin B12 deficiency     Patient Active Problem List   Diagnosis Date Noted  . Intractable nausea and vomiting 03/11/2021  . Thoracic discitis 02/10/2021  . Pressure injury of skin 12/18/2020  . Bacteremia 12/17/2020  . Seizures ([place]) 12/17/2020  . Drug-seeking behavior 12/16/2020  . Seizure ([place]) 12/15/2020  . CKD (chronic kidney disease) stage 3, GFR 30-59 ml/min ([name]) 12/06/2020  . COVID-19 virus infection 12/06/2020  . Protein calorie malnutrition ([place]) 12/06/2020  . Palpitations 11/22/2020  . PAC (premature atrial contraction) 11/22/2020  . History of central line-associated bloodstream infection (CLABSI) 10/21/2020  . Nausea & vomiting 10/21/2020  . Choledocholithiasis (sludge) s/p ERCP 10/2019 10/21/2020  . 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 Abdominal pain 09/07/2020  . Acute metabolic encephalopathy   . Hypomagnesemia   . Partial small bowel obstruction ([place])   . Bacteremia associated with intravascular line ([place]) 08/04/2020  . Enterobacter sepsis ([place]) 07/22/2020  . Anxiety 06/03/2020  . Acute pancreatitis 04/13/2020  . Infection due to Acinetobacter species 03/10/2020  . Pancytopenia ([place]) 03/05/2020  . Central line infection   . Elevated liver enzymes 01/02/2020  . Cholangitis   . Anasarca 10/28/2019  . Acute kidney injury superimposed on chronic kidney disease ([place]) 10/28/2019  . Falls  10/28/2019  . Malnutrition ([place])   . Vitamin B12 deficiency   . GERD (gastroesophageal reflux disease)   . Chronic pain syndrome   . Hypokalemia due to excessive gastrointestinal loss of potassium 10/13/2019  . Fever   . Hypokalemia 10/12/2019  . Chronic diarrhea 10/12/2019  . Dysuria 10/12/2019  . Bilateral lower extremity edema 10/12/2019  . AKI (acute kidney injury) ([place])  10/12/2019  . Fungemia 08/27/2019  . IDA (iron deficiency anemia) 11/03/2018  . Dilation of biliary tract 08/28/2018  . Severe diarrhea 03/07/2018  . LFTs abnormal 01/27/2018  . GI tract obstruction ([place]) 01/27/2018  . Gram-negative bacteremia 09/13/2017  . HTN (hypertension), benign 12/02/2016  . Intractable pain 12/02/2016  . Anemia 12/02/2016  . 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 Avascular necrosis ([place]) 06/14/2015  . Polyarthralgia 05/03/2015  . On total parenteral nutrition (TPN) 12/30/2014  . Osteoporosis 12/24/2014  . Low back pain 12/16/2014  . Vitamin D deficiency 12/16/2014  . 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 History of colonic diverticulitis 2014  . Depression 07/24/2012  . Gram-positive bacteremia 07/24/2012  . Small bowel obstruction due to adhesions ([employer]) 07/24/2012  . Diarrhea 12/13/2011  . Neuralgia and neuritis 06/01/2011  . Myalgia and myositis 08/12/2003  . Crohn's disease of small & large intestine with SGS 1984    Past Surgical History:  Procedure Laterality Date  . ABDOMINAL ADHESION SURGERY  01/22/2018  . APPENDECTOMY  1989  . BILIARY DILATION  11/26/2019   Procedure: BILIARY DILATION;  Surgeon: Jackquline Denmark, MD;  Location: [facility];  Service: Endoscopy;;  . BILIARY DILATION  03/08/2020   Procedure: BILIARY DILATION;  Surgeon: Irving Copas., MD;  Location: [facility];  Service: Gastroenterology;;  . BIOPSY  03/08/2020   Procedure: BIOPSY;  Surgeon: Irving Copas., MD;  Location: [facility];  Service: Gastroenterology;;  . CHEST WALL RESECTION     right thoracotomy,resection of chest mass with anterior rib and reconstruction using prosthetic mesh and video arthroscopy  . CHOLECYSTECTOMY  01/22/2018  . COLONOSCOPY  2019  . ENTEROSTOMY CLOSURE  04/1999  . ERCP N/A 11/26/2019   Procedure: ENDOSCOPIC RETROGRADE CHOLANGIOPANCREATOGRAPHY (ERCP);  Surgeon: Jackquline Denmark, MD;  Location: [facility];  Service: Endoscopy;   Laterality: N/A;  . ERCP N/A 03/08/2020   Procedure: ENDOSCOPIC RETROGRADE CHOLANGIOPANCREATOGRAPHY (ERCP);  Surgeon: Irving Copas., MD;  Location: [facility];  Service: Gastroenterology;  Laterality: N/A;  . ESOPHAGOGASTRODUODENOSCOPY N/A 03/08/2020   Procedure: ESOPHAGOGASTRODUODENOSCOPY (EGD);  Surgeon: Irving Copas., MD;  Location: [facility];  Service: Gastroenterology;  Laterality: N/A;  . EUS N/A 03/08/2020   Procedure: UPPER ENDOSCOPIC ULTRASOUND (EUS) LINEAR;  Surgeon: Irving Copas., MD;  Location: [facility];  Service: Gastroenterology;  Laterality: N/A;  . ILEOCECETOMY  03/1999   ileocolon resection with abdominal stoma  . ILEOSTOMY CLOSURE  2001  . IR FLUORO GUIDE CV LINE LEFT  01/07/2020  . IR FLUORO GUIDE CV LINE LEFT  03/09/2020  . IR FLUORO GUIDE CV LINE LEFT  05/09/2020  . IR FLUORO GUIDE CV LINE LEFT  07/20/2020  . IR FLUORO GUIDE CV LINE RIGHT  08/05/2020  . IR PTA VENOUS EXCEPT DIALYSIS CIRCUIT  01/07/2020  . IR REMOVAL TUN CV CATH W/O FL  08/05/2020  . IR US GUIDE VASC ACCESS LEFT     x 2 06/17/19 and 09/14/2019  . IR US GUIDE VASC ACCESS RIGHT  08/05/2020  . KNEE SURGERY     right knee   . LAPAROSCOPIC SMALL BOWEL RESECTION  2009   2000-2009.  SB resections for Crohns Disease - now with Short gut  . OMENTECTOMY  01/22/2018  . PARTIAL HYSTERECTOMY  1984   with LSO  . REMOVAL OF STONES  11/26/2019   Procedure: REMOVAL OF STONES;  Surgeon: Jackquline Denmark, MD;  Location: [facility];  Service: Endoscopy;;  . REMOVAL OF STONES  03/08/2020   Procedure: REMOVAL OF STONES;  Surgeon: Irving Copas., MD;  Location: [facility];  Service: Gastroenterology;;  . SALPINGOOPHORECTOMY Left 1984  . SALPINGOOPHORECTOMY Right 1990  . SERIAL TRANSVERSE ENTEROPLASTY (STEP) - SMALL BOWEL LENGTHENING  01/22/2018   Dr Alene Mires, [place] length from 120 to 165cm   . SMALL INTESTINE SURGERY  2002  . SMALL INTESTINE SURGERY  2003  .  SPHINCTEROTOMY  11/26/2019   Procedure: SPHINCTEROTOMY;  Surgeon: Jackquline Denmark, MD;  Location[facility];  Service: Endoscopy;;  . TOTAL ABDOMINAL HYSTERECTOMY  1990   with RSO  . UPPER GASTROINTESTINAL ENDOSCOPY       OB History   No obstetric history on file.     Family History  Problem Relation Age of Onset  . Breast cancer Sister   . Multiple sclerosis Sister   . Diabetes Sister   . Lupus Sister   . Colon cancer Other   . Crohn's disease Other   . Seizures Mother   . Glaucoma Mother   . CAD Father   . Heart disease Father   . Hypertension Father     Social History   Tobacco Use  . Smoking status: Former Research scientist (life sciences)  . Smokeless tobacco: Never Used  Vaping Use  . Vaping Use: Never used  Substance Use Topics  . Alcohol use: Not Currently  . Drug use: Never    Home Medications Prior to Admission medications   Medication Sig Start Date End Date Taking? Authorizing Provider  acetaminophen (TYLENOL) 325 MG tablet Take 650 mg by mouth every 6 (six) hours as needed for mild pain.     [provider]  amLODipine (NORVASC) 10 MG tablet Take 1 tablet (10 mg total) by mouth daily. 12/07/20 01/06/21  Erline Hau, MD  budesonide (ENTOCORT EC) 3 MG 24 hr capsule Take 3 capsules (9 mg total) by mouth daily. Patient taking differently: Take 3 mg by mouth every other day. 03/06/21   [name], [name], MD  buPROPion [facility] SR) 100 MG 12 hr tablet Take 200 mg by mouth daily. 03/02/21   [provider]  Calcium 200 MG TABS Take 200 mg by mouth daily.    [provider]  ceFEPime (MAXIPIME) IVPB Inject 2 g into the vein every 12 (twelve) hours. Indication:  Discitis/osteomyelitis First Dose: Yes Last Day of Therapy:  03/24/2021 Labs - Once weekly:  CBC/D and BMP, Labs - Every other week:  ESR and CRP Method of administration: IV Push Method of administration may be changed at the discretion of home infusion pharmacist based upon  assessment of the patient and/or caregiver's ability to self-administer the medication ordered. 02/15/21 03/26/21  Pokhrel, Corrie Mckusick, MD  cholecalciferol (VITAMIN D3) 25 MCG (1000 UT) tablet Take 1,000 Units by mouth daily.     [provider]  cycloSPORINE (RESTASIS) 0.05 % ophthalmic emulsion Place 1 drop into both eyes 2 (two) times daily.    [provider]  daptomycin (CUBICIN) IVPB Inject 600 mg into the vein daily. Indication: discitis/osteomyelitis First Dose: Yes Last Day of Therapy:  03/24/2021 Labs - Once weekly:  CBC/D, BMP, and CPK Labs - Every other week:  ESR and CRP Method of administration: IV Push Method of administration may be changed at the discretion of home infusion pharmacist based upon assessment of the patient and/or caregiver's ability to self-administer the medication ordered. Patient taking differently: Inject 600 mg into the vein daily. Indication: discitis/osteomyelitis First Dose: Yes Last Day of Therapy:  03/24/2021 Labs - Once weekly:  CBC/D, BMP, and CPK Labs - Every other week:  ESR and CRP Method of administration: IV Push Method of administration may be changed at the discretion of home infusion pharmacist based upon assessment of the patient and/or caregiver's ability to self-administer the medication ordered. 02/15/21 03/26/21  Pokhrel, Corrie Mckusick, MD  dexlansoprazole (DEXILANT) 60 MG capsule Take 1 capsule (60 mg total) by mouth daily. 12/28/20   [name], [name], MD  Dextran 70-Hypromellose 0.1-0.3 % SOLN Place 1 drop into both eyes 4 (four) times daily.    [provider]  diphenoxylate-atropine (LOMOTIL) 2.5-0.025 MG tablet TAKE 1 TABLET BY MOUTH 4 TIMES DAILY AS NEEDED FOR DIARRHEA OR  LOOSE  STOOLS Patient taking differently: Take 1-2 tablets by mouth 4 (four) times daily as needed for diarrhea or loose stools. 12/28/20   [name], [name], MD  DULoxetine (CYMBALTA) 30 MG capsule Take 3 capsules (90 mg total) by mouth  daily. 12/07/20   [name], [name], MD  estradiol (ESTRACE) 2 MG tablet Take 1 tablet (2 mg total) by mouth daily. 12/07/20   Erline Hau, MD  GATTEX 5 MG KIT Inject 5 mLs into the skin daily. 01/03/21   [provider]  hydrALAZINE (APRESOLINE) 50 MG tablet Take 1.5 tablets (75 mg total) by mouth 3 (three) times daily. 11/22/20 02/20/21  Rudean Haskell A, MD  HYDROmorphone (DILAUDID) 4 MG tablet Take 4 mg by mouth 4 (four) times daily as needed for moderate pain.  09/15/20   [provider]  levETIRAcetam (KEPPRA) 500 MG tablet Take 1 tablet (500 mg total) by mouth 2 (two) times daily. 02/24/21 03/26/21  Cameron Sprang, MD  lipase/protease/amylase (CREON) 36000 UNITS CPEP capsule Take 1 capsule (36,000 Units total) by mouth 3 (three) times daily as needed (with meals for digestion). 06/28/20   [name], [name], MD  loperamide (IMODIUM) 2 MG capsule TAKE 1 CAPSULE BY MOUTH AS NEEDED FOR DIARRHEA OR LOOSE STOOLS Patient not taking: No sig reported 01/20/21   [name], [name], MD  methadone (DOLOPHINE) 5 MG tablet Take 1 tablet (5 mg total) by mouth 5 (five) times daily. Patient taking differently: Take 5 mg by mouth 4 (four) times daily. 06/30/20   Florencia Reasons, MD  metoprolol tartrate (LOPRESSOR) 100 MG tablet Take 1 tablet (100 mg total) by mouth 2 (two) times daily. 02/21/21   [name], [name], MD  Multiple Vitamins-Minerals (MULTIVITAMIN ADULT PO) Take 1 tablet by mouth daily.    [provider]  NARCAN 4 MG/0.1ML LIQD nasal spray kit Place 1 spray into the nose once as needed (overdose). 10/14/20   [provider]  PRESCRIPTION MEDICATION Inject 1 each into the vein daily. Home TPN . [facility] . 1 bag for 12 hours. [phone]    [provider]  Probiotic Product (PROBIOTIC-10 PO) Take 1 capsule by mouth daily.     [provider]  promethazine (PHENERGAN) 25 MG  tablet Take 1 tablet (25 mg total) by mouth every 6 (six) hours as  needed for nausea or vomiting. 02/21/21   [name], [name], MD  sodium chloride 0.9 % infusion Inject 1 mL into the vein daily as needed (flush). 10/16/20   [provider]  sucralfate (CARAFATE) 1 GM/10ML suspension Take 10 mLs (1 g total) by mouth 4 (four) times daily -  with meals and at bedtime. 06/28/20   [name], [name], MD  Trace Minerals Cu-Mn-Se-Zn (TRALEMENT IV) Inject 1 mL into the vein See admin instructions. Used in TPN bag 4 times weekly    [provider]  vitamin B-12 (CYANOCOBALAMIN) 100 MCG tablet Take 1,000 mcg by mouth daily.     [provider]    Allergies    Meperidine, Hyoscyamine, Cefepime, Gabapentin, Lyrica [pregabalin], Topamax [topiramate], Zosyn [piperacillin sod-tazobactam so], Fentanyl, and Morphine and related  Review of Systems   Review of Systems  Constitutional: Negative for chills and fever.  Skin: Negative for color change and wound.    Physical Exam Updated Vital Signs BP (!) 145/80 (BP Location: Left Arm)   Pulse 75   Temp 98.6 F (37 C) (Oral)   Resp 14   Ht 5' 7"  (1.702 m)   Wt 62.1 kg   SpO2 100%   BMI 21.46 kg/m   Physical Exam Vitals and nursing note reviewed.  Constitutional:      General: She is not in acute distress.    Appearance: She is well-developed.  HENT:     Head: Normocephalic and atraumatic.  Eyes:     Conjunctiva/sclera: Conjunctivae normal.  Cardiovascular:     Rate and Rhythm: Normal rate and regular rhythm.     Heart sounds: No murmur heard.   Pulmonary:     Effort: Pulmonary effort is normal. No respiratory distress.     Breath sounds: Normal breath sounds.  Abdominal:     Palpations: Abdomen is soft.     Tenderness: There is no abdominal tenderness.  Musculoskeletal:     Cervical back: Neck supple.  Skin:    General: Skin is warm and dry.     Findings: No bruising or erythema.     Comments:  Right femoral tunneled line with no evidence of surrounding erythema, drainage  Neurological:     Mental Status: She is alert.     ED Results / Procedures / Treatments   Labs (all labs ordered are listed, but only abnormal results are displayed) Labs Reviewed - No data to display  EKG None  Radiology No results found.  Procedures Procedures   Medications Ordered in ED Medications  heparin lock flush 100 unit/mL (has no administration in time range)    ED Course  I have reviewed the triage vital signs and the nursing notes.  Pertinent labs & imaging results that were available during my care of the patient were reviewed by me and considered in my medical decision making (see chart for details).    MDM Rules/Calculators/A&P                          60 year old female with complaints of blood in her right thigh tunneling this morning.  No signs of infection, vitals are reassuring.  Patient has no other complaints.  IV team saw, evaluated the patient, flushed.  And I will line working well.  We discussed return precautions.  Stable for discharge. Final Clinical Impression(s) / ED Diagnoses Final diagnoses:  Problem with vascular access    Rx / DC Orders ED  Discharge Orders    None       Suz[place], [place] 03/21/21 1353    Carmin Muskrat, MD 03/21/21 [phone]

## 2021-03-22 ENCOUNTER — Other Ambulatory Visit: Payer: Self-pay | Admitting: Internal Medicine

## 2021-03-22 DIAGNOSIS — K912 Postsurgical malabsorption, not elsewhere classified: Secondary | ICD-10-CM

## 2021-03-22 DIAGNOSIS — K50819 Crohn's disease of both small and large intestine with unspecified complications: Secondary | ICD-10-CM

## 2021-03-22 DIAGNOSIS — G894 Chronic pain syndrome: Secondary | ICD-10-CM

## 2021-03-27 ENCOUNTER — Ambulatory Visit: Payer: Medicare Other

## 2021-03-28 ENCOUNTER — Ambulatory Visit: Payer: Medicare Other | Admitting: Internal Medicine

## 2021-03-30 ENCOUNTER — Ambulatory Visit (INDEPENDENT_AMBULATORY_CARE_PROVIDER_SITE_OTHER): Payer: Medicare Other | Admitting: Internal Medicine

## 2021-03-30 ENCOUNTER — Telehealth: Payer: Self-pay

## 2021-03-30 ENCOUNTER — Encounter: Payer: Self-pay | Admitting: Internal Medicine

## 2021-03-30 ENCOUNTER — Other Ambulatory Visit: Payer: Self-pay

## 2021-03-30 ENCOUNTER — Inpatient Hospital Stay: Payer: Medicare Other | Admitting: Internal Medicine

## 2021-03-30 VITALS — BP 134/69 | HR 82 | Temp 98.0°F | Ht 67.0 in | Wt 140.0 lb

## 2021-03-30 DIAGNOSIS — G061 Intraspinal abscess and granuloma: Secondary | ICD-10-CM

## 2021-03-30 DIAGNOSIS — Z789 Other specified health status: Secondary | ICD-10-CM

## 2021-03-30 DIAGNOSIS — T82598A Other mechanical complication of other cardiac and vascular devices and implants, initial encounter: Secondary | ICD-10-CM

## 2021-03-30 NOTE — Telephone Encounter (Addendum)
Patient's nurse Lawernce Ion with [facility] [phone]) is calling in regards to patient getting her central line replaced. Patient using the central line at night for TPN. Lawernce Ion states one of the ports on the central line has been leaking for a week , no blood return and  patient is only able to use one of the ports at this time.  Central line was place at [facility] and patient was advised by [facility] that she could come back to them to have the central line replaced, but patient does not have anyone to take her to [facility]. Patient's nurse is asking if our office will write orders for patient to have central line replaced. Patient is scheduled with Dr. Baxter Flattery today for her hospital follow up.  Please advise.

## 2021-03-30 NOTE — Progress Notes (Signed)
RFV: follow up for hospitalization  Patient ID: Caitlin Peters, female   DOB: 10-May-1961, 60 y.o.   MRN: [id number]  HPI  Finished iv abtx on April 29th. Treated broadly with daptomycin and cefepime. 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 Paravertebral and ventral epidural phlegmon. No significant canal stenosis. No abscess. Outpatient Encounter Medications as of 03/30/2021  Medication Sig  . acetaminophen (TYLENOL) 325 MG tablet Take 650 mg by mouth every 6 (six) hours as needed for mild pain.   [name] Kitchen amLODipine (NORVASC) 10 MG tablet Take 1 tablet (10 mg total) by mouth daily.  . budesonide (ENTOCORT EC) 3 MG 24 hr capsule Take 3 capsules (9 mg total) by mouth daily. (Patient taking differently: Take 3 mg by mouth every other day.)  . buPROPion (WELLBUTRIN SR) 100 MG 12 hr tablet Take 200 mg by mouth daily.  . Calcium 200 MG TABS Take 200 mg by mouth daily.  . cholecalciferol (VITAMIN D3) 25 MCG (1000 UT) tablet Take 1,000 Units by mouth daily.   . cycloSPORINE (RESTASIS) 0.05 % ophthalmic emulsion Place 1 drop into both eyes 2 (two) times daily.  [name] Kitchen denosumab (PROLIA) 60 MG/ML SOSY injection Inject 60 mg into the skin every 6 (six) months. Injection was April 2022  . dexlansoprazole (DEXILANT) 60 MG capsule Take 1 capsule (60 mg total) by mouth daily.  [name] Kitchen Dextran 70-Hypromellose 0.1-0.3 % SOLN Place 1 drop into both eyes 4 (four) times daily.  . diphenoxylate-atropine (LOMOTIL) 2.5-0.025 MG tablet TAKE 1 TABLET BY MOUTH 4 TIMES DAILY AS NEEDED FOR DIARRHEA OR  LOOSE  STOOLS (Patient taking differently: Take 1-2 tablets by mouth 4 (four) times daily as needed for diarrhea or loose stools.)  . DULoxetine (CYMBALTA) 30 MG capsule TAKE 3 CAPSULES BY MOUTH ONCE DAILY  . estradiol (ESTRACE) 2 MG tablet Take 1 tablet (2 mg total) by mouth daily.  [name] Kitchen GATTEX 5 MG KIT Inject 5 mLs into the skin daily.  . hydrALAZINE (APRESOLINE) 50 MG tablet Take  1.5 tablets (75 mg total) by mouth 3 (three) times daily.  [name] Kitchen HYDROmorphone (DILAUDID) 4 MG tablet Take 4 mg by mouth 4 (four) times daily as needed for moderate pain.   [name] Kitchen levETIRAcetam (KEPPRA) 500 MG tablet Take 1 tablet (500 mg total) by mouth 2 (two) times daily.  . lipase/protease/amylase (CREON) 36000 UNITS CPEP capsule Take 1 capsule (36,000 Units total) by mouth 3 (three) times daily as needed (with meals for digestion).  [name] Kitchen loperamide (IMODIUM) 2 MG capsule TAKE 1 CAPSULE BY MOUTH AS NEEDED FOR DIARRHEA OR LOOSE STOOLS  . methadone (DOLOPHINE) 5 MG tablet Take 1 tablet (5 mg total) by mouth 5 (five) times daily. (Patient taking differently: Take 5 mg by mouth 4 (four) times daily.)  . metoprolol tartrate (LOPRESSOR) 100 MG tablet Take 1 tablet (100 mg total) by mouth 2 (two) times daily.  . Multiple Vitamins-Minerals (MULTIVITAMIN ADULT PO) Take 1 tablet by mouth daily.  [name] Kitchen NARCAN 4 MG/0.1ML LIQD nasal spray kit Place 1 spray into the nose once as needed (overdose).  [name] Kitchen PRESCRIPTION MEDICATION Inject 1 each into the vein daily. Home TPN . [facility] [place] . 1 bag for 12 hours. [phone]  . Probiotic Product (PROBIOTIC-10 PO) Take 1 capsule by mouth daily.   . promethazine (PHENERGAN) 25 MG tablet TAKE 1 TABLET BY MOUTH EVERY 6 HOURS AS NEEDED FOR NAUSEA FOR VOMITING  . sodium chloride 0.9 %  infusion Inject 1 mL into the vein daily as needed (flush).  . sucralfate (CARAFATE) 1 GM/10ML suspension Take 10 mLs (1 g total) by mouth 4 (four) times daily -  with meals and at bedtime.  . Trace Minerals Cu-Mn-Se-Zn (TRALEMENT IV) Inject 1 mL into the vein See admin instructions. Used in TPN bag 4 times weekly  . vitamin B-12 (CYANOCOBALAMIN) 100 MCG tablet Take 1,000 mcg by mouth daily.   [name] Kitchen ceFEPIme (MAXIPIME) 2 g injection  (Patient not taking: Reported on 03/30/2021)  . DAPTOmycin (CUBICIN) 500 MG injection Inject into the vein. (Patient not taking: Reported on 03/30/2021)    No facility-administered encounter medications on file as of 03/30/2021.     Patient Active Problem List   Diagnosis Date Noted  . Intractable nausea and vomiting 03/11/2021  . Thoracic discitis 02/10/2021  . Pressure injury of skin 12/18/2020  . Bacteremia 12/17/2020  . Seizures ([place]) 12/17/2020  . Drug-seeking behavior 12/16/2020  . Seizure ([place]) 12/15/2020  . CKD (chronic kidney disease) stage 3, GFR 30-59 ml/min ([name]) 12/06/2020  . COVID-19 virus infection 12/06/2020  . Protein calorie malnutrition ([place]) 12/06/2020  . Palpitations 11/22/2020  . PAC (premature atrial contraction) 11/22/2020  . History of central line-associated bloodstream infection (CLABSI) 10/21/2020  . Nausea & vomiting 10/21/2020  . Choledocholithiasis (sludge) s/p ERCP 10/2019 10/21/2020  . 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 Abdominal pain 09/07/2020  . Acute metabolic encephalopathy   . Hypomagnesemia   . Partial small bowel obstruction ([place])   . Bacteremia associated with intravascular line ([place]) 08/04/2020  . Enterobacter sepsis ([place]) 07/22/2020  . Anxiety 06/03/2020  . Acute pancreatitis 04/13/2020  . Infection due to Acinetobacter species 03/10/2020  . Pancytopenia ([place]) 03/05/2020  . Central line infection   . Elevated liver enzymes 01/02/2020  . Cholangitis   . Anasarca 10/28/2019  . Acute kidney injury superimposed on chronic kidney disease ([place]) 10/28/2019  . Falls 10/28/2019  . Malnutrition ([place])   . Vitamin B12 deficiency   . GERD (gastroesophageal reflux disease)   . Chronic pain syndrome   . Hypokalemia due to excessive gastrointestinal loss of potassium 10/13/2019  . Fever   . Hypokalemia 10/12/2019  . Chronic diarrhea 10/12/2019  . Dysuria 10/12/2019  . Bilateral lower extremity edema 10/12/2019  . AKI (acute kidney injury) ([place]) 10/12/2019  . Fungemia 08/27/2019  . IDA (iron deficiency anemia) 11/03/2018  . Dilation of biliary tract 08/28/2018  . Severe diarrhea  03/07/2018  . LFTs abnormal 01/27/2018  . GI tract obstruction ([place]) 01/27/2018  . Gram-negative bacteremia 09/13/2017  . HTN (hypertension), benign 12/02/2016  . Intractable pain 12/02/2016  . Anemia 12/02/2016  . 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 Avascular necrosis ([place]) 06/14/2015  . Polyarthralgia 05/03/2015  . On total parenteral nutrition (TPN) 12/30/2014  . Osteoporosis 12/24/2014  . Low back pain 12/16/2014  . Vitamin D deficiency 12/16/2014  . 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 History of colonic diverticulitis 2014  . Depression 07/24/2012  . Gram-positive bacteremia 07/24/2012  . Small bowel obstruction due to adhesions ([place]) 07/24/2012  . Diarrhea 12/13/2011  . Neuralgia and neuritis 06/01/2011  . Myalgia and myositis 08/12/2003  . Crohn's disease of small & large intestine with SGS 1984     Health Maintenance Due  Topic Date Due  . URINE MICROALBUMIN  Never done  . COVID-19 Vaccine (4 - Booster for Pfizer series) 03/20/2021     Review of Systems  Physical Exam   BP 134/69   Pulse  82   Temp 98 F (36.7 C) (Oral)   Ht 5' 7"  (1.702 m)   Wt 140 lb (63.5 kg)   SpO2 97%   BMI 21.93 kg/m    No results found for: CD4TCELL No results found for: CD4TABS No results found for: HIV1RNAQUANT No results found for: HEPBSAB No results found for: RPR, LABRPR  CBC Lab Results  Component Value Date   WBC 4.2 03/13/2021   RBC 3.08 (L) 03/13/2021   HGB 9.2 (L) 03/13/2021   HCT 28.0 (L) 03/13/2021   PLT 164 03/13/2021   MCV 90.9 03/13/2021   MCH 29.9 03/13/2021   MCHC 32.9 03/13/2021   RDW 15.9 (H) 03/13/2021   LYMPHSABS 2.0 03/13/2021   MONOABS 0.3 03/13/2021   EOSABS 0.1 03/13/2021    BMET Lab Results  Component Value Date   NA 138 03/13/2021   K 4.0 03/13/2021   CL 111 03/13/2021   CO2 22 03/13/2021   GLUCOSE 131 (H) 03/13/2021   BUN 26 (H) 03/13/2021   CREATININE 0.99 03/13/2021   CALCIUM 6.8 (L) 03/13/2021   GFRNONAA >60 03/13/2021   GFRAA 55  (L) 08/12/2020      Assessment and Plan  - will repeat mri of thoracic spine - has one port on her femoral central line still functioning for TPN administration but the other is not -- we will get line changed - her TPN is being managed by her [facility] provider.

## 2021-03-30 NOTE — Telephone Encounter (Signed)
Patient called the front desk, cancelled her appointment with Dr Baxter Flattery today. She said she is going to the ER for her line. Landis Gandy, RN

## 2021-03-30 NOTE — Telephone Encounter (Signed)
I spoke with the [place] with IR and patient is scheduled for 04/03/21 arrival time 10:00 am. Per Anderson Malta patient will need to checkin at short stay for, NPO after midnight, no diabetic medication before appointment, but can take all other morning medications.  Patient advised of appointment information and instructions. Patient verbalized understanding. [name] T Brooks Sailors

## 2021-03-30 NOTE — Telephone Encounter (Signed)
Yes, we can place an order for her to get her line replaced.

## 2021-04-03 ENCOUNTER — Other Ambulatory Visit: Payer: Self-pay | Admitting: Student

## 2021-04-03 ENCOUNTER — Encounter (HOSPITAL_COMMUNITY): Payer: Self-pay

## 2021-04-03 ENCOUNTER — Other Ambulatory Visit: Payer: Self-pay

## 2021-04-03 ENCOUNTER — Other Ambulatory Visit (HOSPITAL_COMMUNITY): Payer: Self-pay | Admitting: Internal Medicine

## 2021-04-03 ENCOUNTER — Ambulatory Visit (HOSPITAL_COMMUNITY)
Admission: RE | Admit: 2021-04-03 | Discharge: 2021-04-03 | Disposition: A | Discharge status: Home / Self Care | Payer: Medicare Other | Source: Ambulatory Visit | Attending: Internal Medicine | Admitting: Internal Medicine

## 2021-04-03 DIAGNOSIS — Z87891 Personal history of nicotine dependence: Secondary | ICD-10-CM | POA: Insufficient documentation

## 2021-04-03 DIAGNOSIS — K5090 Crohn's disease, unspecified, without complications: Secondary | ICD-10-CM | POA: Diagnosis not present

## 2021-04-03 DIAGNOSIS — E46 Unspecified protein-calorie malnutrition: Secondary | ICD-10-CM | POA: Diagnosis not present

## 2021-04-03 DIAGNOSIS — Y848 Other medical procedures as the cause of abnormal reaction of the patient, or of later complication, without mention of misadventure at the time of the procedure: Secondary | ICD-10-CM | POA: Insufficient documentation

## 2021-04-03 DIAGNOSIS — K912 Postsurgical malabsorption, not elsewhere classified: Secondary | ICD-10-CM | POA: Insufficient documentation

## 2021-04-03 DIAGNOSIS — Z881 Allergy status to other antibiotic agents status: Secondary | ICD-10-CM | POA: Diagnosis not present

## 2021-04-03 DIAGNOSIS — Z888 Allergy status to other drugs, medicaments and biological substances status: Secondary | ICD-10-CM | POA: Insufficient documentation

## 2021-04-03 DIAGNOSIS — Z885 Allergy status to narcotic agent status: Secondary | ICD-10-CM | POA: Insufficient documentation

## 2021-04-03 DIAGNOSIS — T85618A Breakdown (mechanical) of other specified internal prosthetic devices, implants and grafts, initial encounter: Secondary | ICD-10-CM | POA: Diagnosis not present

## 2021-04-03 DIAGNOSIS — Z79899 Other long term (current) drug therapy: Secondary | ICD-10-CM | POA: Insufficient documentation

## 2021-04-03 DIAGNOSIS — N186 End stage renal disease: Secondary | ICD-10-CM

## 2021-04-03 HISTORY — PX: IR FLUORO GUIDE CV LINE RIGHT: IMG2283

## 2021-04-03 MED ORDER — FENTANYL CITRATE (PF) 100 MCG/2ML IJ SOLN
INTRAMUSCULAR | Status: AC | PRN
  Administered 2021-04-03: 50 ug via INTRAVENOUS
  Administered 2021-04-03 (×2): 25 ug via INTRAVENOUS

## 2021-04-03 MED ORDER — HEPARIN SOD (PORK) LOCK FLUSH 100 UNIT/ML IV SOLN
INTRAVENOUS | Status: AC
  Filled 2021-04-03: qty 5

## 2021-04-03 MED ORDER — MIDAZOLAM HCL 2 MG/2ML IJ SOLN
INTRAMUSCULAR | Status: AC
  Filled 2021-04-03: qty 2

## 2021-04-03 MED ORDER — FENTANYL CITRATE (PF) 100 MCG/2ML IJ SOLN
INTRAMUSCULAR | Status: AC
  Filled 2021-04-03: qty 2

## 2021-04-03 MED ORDER — MIDAZOLAM HCL 2 MG/2ML IJ SOLN
INTRAMUSCULAR | Status: AC | PRN
  Administered 2021-04-03: 0.5 mg via INTRAVENOUS
  Administered 2021-04-03: 1 mg via INTRAVENOUS

## 2021-04-03 MED ORDER — VANCOMYCIN HCL IN DEXTROSE 1-5 GM/200ML-% IV SOLN: 1000.0000 mg | Freq: Once | INTRAVENOUS | Status: DC

## 2021-04-03 MED ORDER — LIDOCAINE HCL 1 % IJ SOLN
INTRAMUSCULAR | Status: AC
  Filled 2021-04-03: qty 20

## 2021-04-03 MED ORDER — SODIUM CHLORIDE 0.9 % IV SOLN: INTRAVENOUS | Status: DC

## 2021-04-03 NOTE — Discharge Instructions (Addendum)
Central Line Dialysis Access Placement, Care After This sheet gives you information about how to care for yourself after your procedure. Your health care provider may also give you more specific instructions. If you have problems or questions, contact your health care provider. What can I expect after the procedure? After the procedure, it is common to have:  Mild pain or discomfort.  Mild redness, swelling, or bruising around your incision.  A small amount of blood or clear fluid coming from your incision. Follow these instructions at home: Medicines  Take over-the-counter and prescription medicines only as told by your health care provider.  If you were prescribed an antibiotic medicine, take it as told by your health care provider. Do not stop using the antibiotic even if you start to feel better. Incision care  Follow instructions from your health care provider about how to take care of your incision. Make sure you: ? Wash your hands with soap and water for at least 20 seconds before and after you change your bandage (dressing). If soap and water are not available, use hand sanitizer. ? Change your dressing as told by your health care provider. ? Leave stitches (sutures) in place.  Check your incision area every day for signs of infection. Check for: ? More redness, swelling, or pain. ? More fluid or blood. ? Warmth. ? Pus or a bad smell.   Managing pain and swelling  If directed, apply heat to the affected area as often as told by your health care provider. Use the heat source that your health care provider recommends, such as a moist heat pack or a heating pad. ? Place a towel between your skin and the heat source. ? Leave the heat on for 20-30 minutes. ? Remove the heat if your skin turns bright red. This is especially important if you are unable to feel pain, heat, or cold. You may have a greater risk of getting burned.  If directed, put ice on the catheter site. To do  this: ? Put ice in a plastic bag. ? Place a towel between your skin and the bag. ? Leave the ice on for 20 minutes, 2-3 times a day. Activity  If you were given a sedative during the procedure, it can affect you for several hours. Do not drive or operate machinery until your health care provider says that it is safe.  Do not lift anything that is heavier than 10 lb (4.5 kg), or the limit that you are told, until your health care provider says that it is safe.  Return to your normal activities as told by your health care provider. Ask your health care provider what activities are safe for you. General instructions  Follow instructions from your health care provider about eating or drinking restrictions.  Do not use any products that contain nicotine or tobacco, such as cigarettes, e-cigarettes, and chewing tobacco. These can delay incision healing after surgery. If you need help quitting, ask your health care provider.  Do not take baths, swim, or use a hot tub until your health care provider approves. Ask your health care provider if you may take showers. You may only be allowed to take sponge baths.  Wear compression stockings as told by your health care provider. These stockings help to prevent blood clots and reduce swelling in your legs.  Keep all follow-up visits as told by your health care provider. This is important.   Contact a health care provider if:  Your catheter gets pulled  out of place.  Your catheter site becomes itchy.  You develop a rash around your catheter site.  You have any of these signs of infection: ? More redness, swelling, or pain around your incision. ? More fluid or blood coming from your incision. ? Warmth coming from your incision. ? Pus or a bad smell coming from your incision. ? A fever. Get help right away if:  You become light-headed or dizzy.  You have chest pain or a fast heart rate.  You faint.  You have difficulty breathing.  Your  catheter gets pulled out completely.  You have discomfort in your arms, back, neck, or jaw. These symptoms may represent a serious problem that is an emergency. Do not wait to see if the symptoms will go away. Get medical help right away. Call your local emergency services (911 in the [place] Do not drive yourself to the hospital. Summary  After the procedure, it is common to have mild symptoms of pain, redness, swelling, or bruising around your incision. It is also common to have a small amount of blood or clear fluid coming from your incision.  Check your incision area every day for signs of infection.  Do not take baths, swim, or use a hot tub until your health care provider approves. Ask your health care provider if you may take showers. You may only be allowed to take sponge baths.  Get help right away if you have chest pain or difficulty breathing, or if your catheter gets pulled out completely. This information is not intended to replace advice given to you by your health care provider. Make sure you discuss any questions you have with your health care provider. Document Revised: 10/01/2019 Document Reviewed: 10/01/2019 Elsevier Patient Education  2021 [place]. Moderate Conscious Sedation, Adult Sedation is the use of medicines to promote relaxation and to relieve discomfort and anxiety. Moderate conscious sedation is a type of sedation. Under moderate conscious sedation, you are less alert than normal, but you are still able to respond to instructions, touch, or both. Moderate conscious sedation is used during short medical and dental procedures. It is milder than deep sedation, which is a type of sedation under which you cannot be easily woken up. It is also milder than general anesthesia, which is the use of medicines to make you unconscious. Moderate conscious sedation allows you to return to your regular activities sooner. Tell a health care provider about:  Any allergies you  have.  All medicines you are taking, including vitamins, herbs, eye drops, creams, and over-the-counter medicines.  Any use of steroids. This includes steroids taken by mouth or as a cream.  Any problems you or family members have had with sedatives and anesthetic medicines.  Any blood disorders you have.  Any surgeries you have had.  Any medical conditions you have, such as sleep apnea.  Whether you are pregnant or may be pregnant.  Any use of cigarettes, alcohol, marijuana, or drugs. What are the risks? Generally, this is a safe procedure. However, problems may occur, including:  Getting too much medicine (oversedation).  Nausea.  Allergic reaction to medicines.  Trouble breathing. If this happens, a breathing tube may be used. It will be removed when you are awake and breathing on your own.  Heart trouble.  Lung trouble.  Confusion that gets better with time (emergence delirium). What happens before the procedure? Staying hydrated Follow instructions from your health care provider about hydration, which may include:  Up to 2  hours before the procedure - you may continue to drink clear liquids, such as water, clear fruit juice, black coffee, and plain tea. Eating and drinking restrictions Follow instructions from your health care provider about eating and drinking, which may include:  8 hours before the procedure - stop eating heavy meals or foods, such as meat, fried foods, or fatty foods.  6 hours before the procedure - stop eating light meals or foods, such as toast or cereal.  6 hours before the procedure - stop drinking milk or drinks that contain milk.  2 hours before the procedure - stop drinking clear liquids. Medicines Ask your health care provider about:  Changing or stopping your regular medicines. This is especially important if you are taking diabetes medicines or blood thinners.  Taking medicines such as aspirin and ibuprofen. These medicines can  thin your blood. Do not take these medicines unless your health care provider tells you to take them.  Taking over-the-counter medicines, vitamins, herbs, and supplements. Tests and exams  You will have a physical exam.  You may have blood tests done to show how well: ? Your kidneys and liver work. ? Your blood clots. General instructions  Plan to have a responsible adult take you home from the hospital or clinic.  If you will be going home right after the procedure, plan to have a responsible adult care for you for the time you are told. This is important. What happens during the procedure?  You will be given the sedative. The sedative may be given: ? As a pill that you will swallow. It can also be inserted into the rectum. ? As a spray through the nose. ? As an injection into the muscle. ? As an injection into the vein through an IV.  You may be given oxygen as needed.  Your breathing, heart rate, and blood pressure will be monitored during the procedure.  The medical or dental procedure will be done. The procedure may vary among health care providers and hospitals.   What happens after the procedure?  Your blood pressure, heart rate, breathing rate, and blood oxygen level will be monitored until you leave the hospital or clinic.  You will get fluids through your IV if needed.  Do not drive or operate machinery until your health care provider says that it is safe. Summary  Sedation is the use of medicines to promote relaxation and to relieve discomfort and anxiety. Moderate conscious sedation is a type of sedation that is used during short medical and dental procedures.  Tell the health care provider about any medical conditions that you have and about all the medicines that you are taking.  You will be given the sedative as a pill, a spray through the nose, an injection into the muscle, or an injection into the vein through an IV. Vital signs are monitored during the  sedation.  Moderate conscious sedation allows you to return to your regular activities sooner. This information is not intended to replace advice given to you by your health care provider. Make sure you discuss any questions you have with your health care provider. Document Revised: 03/11/2020 Document Reviewed: 10/08/2019 Elsevier Patient Education  2021 [employer].

## 2021-04-03 NOTE — Discharge Instructions (Signed)
PICC Insertion, Care After This sheet gives you information about how to care for yourself after your procedure. Your health care provider may also give you more specific instructions. If you have problems or questions, contact your health care provider. What can I expect after the procedure? After your procedure, it is common to have mild discomfort at the insertion site. Follow these instructions at home: Activity  Rest as told by your health care provider. Ask your health care provider when you can return to your normal activities.  If your PICC is near or at the bend of your elbow, avoid activity with repeated motion at the elbow.  Do not lift anything that is heavier than 10 lb (4.5 kg), or the limit that you are told, until your health care provider says that it is safe.  Avoid any activity that requires great effort as told by your health care provider.   Bathing  Do not take baths, swim, or use a hot tub until your health care provider approves. Ask your health care provider if you can take showers. You may only be allowed to take sponge baths for bathing.  Keep the bandage (dressing) dry until your health care provider says it can be removed.   General instructions  Do not drive for 24 hours if you were given a medicine to help you relax (sedative).  Take over-the-counter and prescription medicines only as told by your health care provider.  Keep all follow-up visits as told by your health care provider. This is important. Contact a health care provider if:  You have a fever or chills.  You have more redness, swelling, or pain around the insertion site.  You have fluid or blood coming from the insertion site.  Your insertion site feels warm to the touch.  You have pus or a bad smell coming from the insertion site.  Your vein feels hard and raised like a "cord" under the skin around the insertion site. Get help right away if:  You have swelling in the arm in which the  PICC was inserted.  You have numbness or tingling in your fingers, hand, or arm.  You have a red streak going up your arm from where the PICC was inserted.  Your PICC cannot be flushed, is hard to flush, or leaks around the insertion site when flushed.  You have pain in your arm, ear, face, or teeth.  Your PICC is accidentally pulled all the way out. If this happens, cover the insertion site with a bandage or gauze dressing. Do not throw the PICC away. Your health care provider will need to check it.  Your PICC was tugged or pulled and has partially come out. Do not push the PICC back in.  You hear a "flushing" sound when the PICC is flushed.  You feel your heart racing or skipping beats.  There is a hole or tear in the PICC.  You have pain in your chest.  You have shortness of breath or difficulty breathing. Summary  After your procedure, it is common to have mild discomfort at the insertion site.  Do not lift anything that is heavier than 10 lb (4.5 kg), or the limit that you are told, until your health care provider says that it is safe.  Flush the PICC as told by your health care provider. Let your health care provider know right away if the PICC is hard to flush or does not flush. Do not use force to flush the  PICC.  Check your insertion site every day for signs of infection. This information is not intended to replace advice given to you by your health care provider. Make sure you discuss any questions you have with your health care provider. Document Revised: 03/24/2020 Document Reviewed: 03/24/2020 Elsevier Patient Education  [place].

## 2021-04-03 NOTE — Procedures (Signed)
Interventional Radiology Procedure Note  Procedure: Powerline exchange  Indication: Fractured powerline  Findings: Please refer to procedural dictation for full description.  Complications: None  EBL: < 10 mL  Miachel Roux, MD [phone]

## 2021-04-03 NOTE — H&P (Signed)
Chief Complaint: Patient was seen in consultation today for tunneled central venous catheter malfunction/exchange.  Referring Physician(s): Snider,Cynthia (ID)  Supervising Physician: Mir, Sharen Heck  Patient Status: [facility] - Out-pt  History of Present Illness: Caitlin Peters is a 60 y.o. female with a past medical history of hypertension, GERD, diverticulosis, Crohn's disease resulting in short gut syndrome/malnutiriton on TPN via right femoral tunneled CVC, pancreatitis, choledocholithiasis, CKD stage III, iron deficiency anemia, cataracts, osteoporosis, and depression. She is known to IR- she has undergone multiple IR exchanges/placements of femoral tunneled CVC (01/07/2020, 03/09/2020, 05/09/2020, 07/20/2020; most recently had a right femoral tunneled CVC placed 08/05/2020). She was seen by Dr. Baxter Flattery (ID) on OP basis regarding osteomyelitis/discitis of thoracic spine 03/30/2021. At that time, patient was noted to have malfunction of right femoral tunneled CVC (leaking, one of the two lumens non-functional per notes).  IR requested by Dr. Baxter Flattery for possible image-guided tunneled CVC exchange/replacement. Patient awake and alert laying in bed. States "blue port" of tunneled CVC "doesn't have blood return" and "red port" of tunneled CVC chronically leaks- otherwise catheter functions properly. Complains of abdominal pain, chronic in nature, rated 8/10 at this time. Denies N/V associated with abdominal pain. Denies fever, chills, chest pain, dyspnea, or headache.    Past Medical History:  Diagnosis Date  . Acute pancreatitis 04/13/2020  . Anasarca 10/2019  . AVN (avascular necrosis of bone) ([place])   . Cataract   . Choledocholithiasis (sludge) s/p ERCP 10/2019 10/21/2020  . Chronic pain syndrome   . CKD (chronic kidney disease), stage III ([place])   . Crohn disease ([place])   . Crohn's disease of small & large intestine with SGS 1984   Caitlin Peters is a 60year old female with a history of  Crohn's disease diagnosed in 67 (age 6), history of long-term steroid use with osteoporosis, S/P multiple bowel resections ([id number]) complicated by chronic back and abdominal pain, steatorrhea and short bowel syndrome. The patient has been left with ~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 attached to proximal transverse colon through rectum. She has been  . Depression   . Diverticulosis   . GERD (gastroesophageal reflux disease)   . HTN (hypertension)   . IDA (iron deficiency anemia)   . Malnutrition ([place])   . Mass in chest   . Osteoporosis   . Osteoporosis 12/24/2014  . Pancreatitis   . 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 Vitamin B12 deficiency     Past Surgical History:  Procedure Laterality Date  . ABDOMINAL ADHESION SURGERY  01/22/2018  . APPENDECTOMY  1989  . BILIARY DILATION  11/26/2019   Procedure: BILIARY DILATION;  Surgeon: Jackquline Denmark, MD;  Location: [facility];  Service: Endoscopy;;  . BILIARY DILATION  03/08/2020   Procedure: BILIARY DILATION;  Surgeon: Irving Copas., MD;  Location: [place];  Service: Gastroenterology;;  . BIOPSY  03/08/2020   Procedure: BIOPSY;  Surgeon: Irving Copas., MD;  Location: [place];  Service: Gastroenterology;;  . CHEST WALL RESECTION     right thoracotomy,resection of chest mass with anterior rib and reconstruction using prosthetic mesh and video arthroscopy  . CHOLECYSTECTOMY  01/22/2018  . COLONOSCOPY  2019  . ENTEROSTOMY CLOSURE  04/1999  . ERCP N/A 11/26/2019   Procedure: ENDOSCOPIC RETROGRADE  CHOLANGIOPANCREATOGRAPHY (ERCP);  Surgeon: Jackquline Denmark, MD;  Location: [facility];  Service: Endoscopy;  Laterality: N/A;  . ERCP N/A 03/08/2020   Procedure: ENDOSCOPIC  RETROGRADE CHOLANGIOPANCREATOGRAPHY (ERCP);  Surgeon: Irving Copas., MD;  Location: [place];  Service: Gastroenterology;  Laterality: N/A;  . ESOPHAGOGASTRODUODENOSCOPY N/A 03/08/2020   Procedure: ESOPHAGOGASTRODUODENOSCOPY (EGD);  Surgeon: Irving Copas., MD;  Location: [place];  Service: Gastroenterology;  Laterality: N/A;  . EUS N/A 03/08/2020   Procedure: UPPER ENDOSCOPIC ULTRASOUND (EUS) LINEAR;  Surgeon: Irving Copas., MD;  Location: [place];  Service: Gastroenterology;  Laterality: N/A;  . ILEOCECETOMY  03/1999   ileocolon resection with abdominal stoma  . ILEOSTOMY CLOSURE  2001  . IR FLUORO GUIDE CV LINE LEFT  01/07/2020  . IR FLUORO GUIDE CV LINE LEFT  03/09/2020  . IR FLUORO GUIDE CV LINE LEFT  05/09/2020  . IR FLUORO GUIDE CV LINE LEFT  07/20/2020  . IR FLUORO GUIDE CV LINE RIGHT  08/05/2020  . IR PTA VENOUS EXCEPT DIALYSIS CIRCUIT  01/07/2020  . IR REMOVAL TUN CV CATH W/O FL  08/05/2020  . IR US GUIDE VASC ACCESS LEFT     x 2 06/17/19 and 09/14/2019  . IR US GUIDE VASC ACCESS RIGHT  08/05/2020  . KNEE SURGERY     right knee   . LAPAROSCOPIC SMALL BOWEL RESECTION  2009   2000-2009.  SB resections for Crohns Disease - now with Short gut  . OMENTECTOMY  01/22/2018  . PARTIAL HYSTERECTOMY  1984   with LSO  . REMOVAL OF STONES  11/26/2019   Procedure: REMOVAL OF STONES;  Surgeon: Jackquline Denmark, MD;  Location: [facility];  Service: Endoscopy;;  . REMOVAL OF STONES  03/08/2020   Procedure: REMOVAL OF STONES;  Surgeon: Irving Copas., MD;  Location: [place];  Service: Gastroenterology;;  . SALPINGOOPHORECTOMY Left 1984  . SALPINGOOPHORECTOMY Right 1990  . SERIAL TRANSVERSE ENTEROPLASTY (STEP) - SMALL BOWEL LENGTHENING  01/22/2018   Dr Alene Mires, [place] length from 120 to 165cm   . SMALL INTESTINE SURGERY  2002  . SMALL INTESTINE SURGERY  2003  . SPHINCTEROTOMY  11/26/2019   Procedure: SPHINCTEROTOMY;  Surgeon:  Jackquline Denmark, MD;  Location: [facility];  Service: Endoscopy;;  . TOTAL ABDOMINAL HYSTERECTOMY  1990   with RSO  . UPPER GASTROINTESTINAL ENDOSCOPY      Allergies: Meperidine, Hyoscyamine, Cefepime, Gabapentin, Lyrica [pregabalin], Topamax [topiramate], Zosyn [piperacillin sod-tazobactam so], Fentanyl, and Morphine and related  Medications: Prior to Admission medications   Medication Sig Start Date End Date Taking? Authorizing Provider  acetaminophen (TYLENOL) 325 MG tablet Take 650 mg by mouth every 6 (six) hours as needed for mild pain.    Yes [provider]  amLODipine (NORVASC) 10 MG tablet Take 10 mg by mouth daily.   Yes [provider]  budesonide (ENTOCORT EC) 3 MG 24 hr capsule Take 3 capsules (9 mg total) by mouth daily. Patient taking differently: Take 3 mg by mouth every other day. 03/06/21  Yes [name], [name], MD  buPROPion [facility] SR) 100 MG 12 hr tablet Take 200 mg by mouth daily. 03/02/21  Yes [provider]  Calcium 200 MG TABS Take 200 mg by mouth daily.   Yes [provider]  cholecalciferol (VITAMIN D3) 25 MCG (1000 UT) tablet Take 1,000 Units by mouth daily.    Yes [provider]  cycloSPORINE (RESTASIS) 0.05 % ophthalmic emulsion Place 1 drop into both eyes  2 (two) times daily.   Yes [provider]  denosumab (PROLIA) 60 MG/ML SOSY injection Inject 60 mg into the skin every 6 (six) months. Injection was April 2022   Yes [provider]  dexlansoprazole (DEXILANT) 60 MG capsule Take 1 capsule (60 mg total) by mouth daily. 12/28/20  Yes [name], [name], MD  Dextran 70-Hypromellose 0.1-0.3 % SOLN Place 1 drop into both eyes 4 (four) times daily.   Yes [provider]  diphenoxylate-atropine (LOMOTIL) 2.5-0.025 MG tablet TAKE 1 TABLET BY MOUTH 4 TIMES DAILY AS NEEDED FOR DIARRHEA OR  LOOSE  STOOLS Patient taking differently: Take 1-2 tablets by mouth 4 (four) times daily as  needed for diarrhea or loose stools. 12/28/20  Yes [name], [name], MD  DULoxetine (CYMBALTA) 30 MG capsule TAKE 3 CAPSULES BY MOUTH ONCE DAILY Patient taking differently: Take 90 mg by mouth daily. 03/22/21  Yes [name], [name], MD  estradiol (ESTRACE) 2 MG tablet Take 1 tablet (2 mg total) by mouth daily. 12/07/20  Yes [name], [name], MD  famotidine (PEPCID) 20 MG tablet Take 20 mg by mouth daily as needed for heartburn or indigestion.   Yes [provider]  GATTEX 5 MG KIT Inject 5 mLs into the skin daily. 01/03/21  Yes [provider]  hydrALAZINE (APRESOLINE) 50 MG tablet Take 75 mg by mouth 3 (three) times daily.   Yes [provider]  HYDROmorphone (DILAUDID) 4 MG tablet Take 4 mg by mouth 4 (four) times daily as needed for moderate pain.  09/15/20  Yes [provider]  levETIRAcetam (KEPPRA) 500 MG tablet Take 500 mg by mouth 2 (two) times daily.   Yes [provider]  lipase/protease/amylase (CREON) 36000 UNITS CPEP capsule Take 1 capsule (36,000 Units total) by mouth 3 (three) times daily as needed (with meals for digestion). 06/28/20  Yes [name], [name], MD  loperamide (IMODIUM) 2 MG capsule TAKE 1 CAPSULE BY MOUTH AS NEEDED FOR DIARRHEA OR LOOSE STOOLS Patient taking differently: Take 2 mg by mouth as needed for diarrhea or loose stools. TAKE 1 CAPSULE BY MOUTH AS NEEDED FOR DIARRHEA OR LOOSE STOOLS 01/20/21  Yes [name], [name], MD  methadone (DOLOPHINE) 5 MG tablet Take 1 tablet (5 mg total) by mouth 5 (five) times daily. 06/30/20  Yes Florencia Reasons, MD  metoprolol tartrate (LOPRESSOR) 100 MG tablet Take 1 tablet (100 mg total) by mouth 2 (two) times daily. Patient taking differently: Take by mouth 2 (two) times daily. 02/21/21  Yes [name], [name], MD  Multiple Vitamins-Minerals (MULTIVITAMIN ADULT PO) Take 1 tablet by mouth daily.   Yes [provider]  NARCAN 4 MG/0.1ML LIQD nasal  spray kit Place 1 spray into the nose once as needed (overdose). 10/14/20  Yes [provider]  Probiotic Product (PROBIOTIC-10 PO) Take 1 capsule by mouth daily.    Yes [provider]  promethazine (PHENERGAN) 25 MG tablet TAKE 1 TABLET BY MOUTH EVERY 6 HOURS AS NEEDED FOR NAUSEA FOR VOMITING Patient taking differently: Take 25 mg by mouth every 6 (six) hours as needed for nausea or vomiting. 03/22/21  Yes [name], [name], MD  sodium chloride 0.9 % infusion Inject 1 mL into the vein daily as needed (flush). 10/16/20  Yes [provider]  sucralfate (CARAFATE) 1 GM/10ML suspension Take 10 mLs (1 g total) by mouth 4 (four) times daily -  with meals and at bedtime. 06/28/20  Yes [name], [name], MD  Trace Minerals Cu-Mn-Se-Zn (TRALEMENT IV) Inject 1 mL into the vein See admin instructions. Used in TPN bag 4 times weekly   Yes [provider]  vitamin B-12 (CYANOCOBALAMIN) 100 MCG tablet Take 1,000 mcg by mouth daily.    Yes [provider]  PRESCRIPTION MEDICATION Inject 1 each into the vein daily. Home TPN . [facility] . 1 bag for 12 hours. [phone]    [provider]     Family History  Problem Relation Age of Onset  . Breast cancer Sister   . Multiple sclerosis Sister   . Diabetes Sister   . Lupus Sister   . Colon cancer Other   . Crohn's disease Other   . Seizures Mother   . Glaucoma Mother   . CAD Father   . Heart disease Father   . Hypertension Father     Social History   Socioeconomic History  . Marital status: Single    Spouse name: Not on file  . Number of children: Not on file  . Years of education: Not on file  . Highest education level: Not on file  Occupational History  . Occupation: disabled  Tobacco Use  . Smoking status: Former Research scientist (life sciences)  . Smokeless tobacco: Never Used  Vaping Use  . Vaping Use: Never used  Substance and Sexual Activity  . Alcohol use: Not  Currently  . Drug use: Never  . Sexual activity: Yes  Other Topics Concern  . Not on file  Social History Narrative   Right handed   One story apartment   No caffeine   Social Determinants of Health   Financial Resource Strain: Low Risk   . Difficulty of Paying Living Expenses: Not hard at all  Food Insecurity: Not on file  Transportation Needs: No Transportation Needs  . Lack of Transportation (Medical): No  . Lack of Transportation (Non-Medical): No  Physical Activity: Not on file  Stress: Not on file  Social Connections: Not on file     Review of Systems: A 12 point ROS discussed and pertinent positives are indicated in the HPI above.  All other systems are negative.  Review of Systems  Constitutional: Negative for chills and fever.  Respiratory: Negative for shortness of breath and wheezing.   Cardiovascular: Negative for chest pain and palpitations.  Gastrointestinal: Positive for abdominal pain. Negative for nausea and vomiting.  Psychiatric/Behavioral: Negative for behavioral problems and confusion.    Vital Signs: BP (!) 154/67   Pulse 82   Temp 98.6 F (37 C) (Oral)   Resp 18   Ht _0  (1.702 m)   Wt 140 lb (63.5 kg)   SpO2 98%   BMI 21.93 kg/m   Physical Exam Vitals and nursing note reviewed.  Constitutional:      General: She is not in acute distress. Cardiovascular:     Rate and Rhythm: Normal rate and regular rhythm.     Heart sounds: Murmur heard.    Pulmonary:     Effort: Pulmonary effort is normal. No respiratory distress.     Breath sounds: Normal breath sounds. No wheezing.  Skin:    General: Skin is warm and dry.     Comments: Right femoral tunneled CVC site without tenderness, erythema, drainage, or active bleeding.  Neurological:     Mental Status: She is alert and oriented to person, place, and time.      MD Evaluation Airway: WNL  Heart: WNL Abdomen: WNL Chest/ Lungs: WNL ASA  Classification: 3 Mallampati/Airway Score:  Two   Imaging: DG Abd 1 View  Result Date: 03/11/2021 CLINICAL DATA:  Left lower quadrant and low back pain for 4 days. Crohn disease. EXAM: ABDOMEN - 1 VIEW COMPARISON:  Plain film and CT of 1 day prior. FINDINGS: Surgical sutures within the abdomen bilaterally. No gaseous distention of bowel loops. No gross free intraperitoneal air. Right femoral catheter terminates over the right hemidiaphragm. Contrast within the bladder. Convex left lumbar spine curvature. IMPRESSION: No evidence of bowel obstruction or acute process. Electronically Signed   By: Abigail Miyamoto M.D.   On: 03/11/2021 16:02   DG Abdomen 1 View  Result Date: 03/10/2021 CLINICAL DATA:  60 year old female with right femoral catheter, tunneled catheter in right leg. Crohn disease. EXAM: ABDOMEN - 1 VIEW COMPARISON:  Abdominal radiographs 03/08/2021 and earlier. FINDINGS: Portable AP supine view at at 0939 hours. Right femoral approach vascular catheter has stable configuration in the right inguinal region from 2 days ago. It tracks cephalad, with the tip at the level of the hepatic IVC. No kinking or discontinuity identified. Non obstructed bowel gas pattern. Multiple bowel staple lines in the bilateral abdomen. Stable visualized osseous structures. Levoconvex lumbar scoliosis. IMPRESSION: 1. Right femoral approach vascular catheter terminates at the hepatic IVC level. No adverse features identified. 2. Postoperative changes to the abdomen. Nonobstructed bowel-gas pattern. Electronically Signed   By: Genevie Ann M.D.   On: 03/10/2021 10:10   CT Abdomen Pelvis W Contrast  Result Date: 03/10/2021 CLINICAL DATA:  60 year old with nausea, vomiting and abdominal pain. History of Crohn's disease. EXAM: CT ABDOMEN AND PELVIS WITH CONTRAST TECHNIQUE: Multidetector CT imaging of the abdomen and pelvis was performed using the standard protocol following bolus administration of intravenous contrast. CONTRAST:  129m OMNIPAQUE IOHEXOL 300 MG/ML  SOLN  COMPARISON:  02/09/2021 FINDINGS: Lower chest: Lung bases are clear.  No pleural effusions. Hepatobiliary: Normal appearance of the liver. The gallbladder is absent. Main portal venous system is patent. Extrahepatic bile duct measures roughly 6 mm. No significant biliary dilatation. Pancreas: Mild pancreatic duct dilatation is stable. No peripancreatic inflammation. Spleen: Normal in size without focal abnormality. Adrenals/Urinary Tract: Normal adrenal glands. Normal appearance of the kidneys. No hydronephrosis. No suspicious renal lesions. Moderate distention of the urinary bladder. Stomach/Bowel: Multiple surgical changes throughout the bowel. Scattered fluid-filled loops of bowel, largest in the left lateral abdomen. Overall, nonobstructive bowel gas pattern. No focal bowel inflammation. Vascular/Lymphatic: Aorta and main visceral arteries are patent. Mild atherosclerotic disease in abdominal aorta. Negative for an abdominal aortic aneurysm. Central venous catheter enters the right common femoral vein and terminates in the upper IVC just below the inferior cavoatrial junction. No significant lymph node enlargement in the abdomen or pelvis. Reproductive: Status post hysterectomy. No adnexal masses. Other: Negative for ascites.  Negative for free air. Musculoskeletal: Irregularity involving the right iliac crest is similar to the previous examination. Degenerative facet disease at L5-S1. No acute bone abnormality. IMPRESSION: 1. No acute abnormality in the abdomen or pelvis. 2. Extensive postsurgical changes involving the bowel. Scattered areas of fluid-filled bowel but no evidence for obstruction and no focal bowel inflammation. 3. Right femoral central line that terminates in the upper IVC just below the inferior cavoatrial junction. Electronically Signed   By: AMarkus DaftM.D.   On: 03/10/2021 11:13   DG Abd 2 Views  Result Date: 03/08/2021 CLINICAL DATA:  Nausea and vomiting. c/o left sided abdominal pain,  nausea and emesis x 3 days. Hx of Crohn's, Pt states issues similar to prior flares. Taking ABX for spinal infection. EXAM: ABDOMEN - 2 VIEW COMPARISON:  X-ray abdomen 02/14/2021 FINDINGS: Nonspecific air-fluid levels on the upright view. No definite dilatation of the small or large bowel. Multiple and Modic bowel sutures are noted. No definite free intraperitoneal gas on the upright view. Phleboliths overlie the pelvis. No radio-opaque calculi or other significant radiographic abnormality is seen. Right femoral approach central catheter with tip terminating over the expected region of the inferior vena cava. Visualized lower thoraces are unremarkable.  No pleural effusions. IMPRESSION: Nonspecific air-fluid levels with otherwise a nonobstructive bowel gas pattern. Electronically Signed   By: Iven Finn M.D.   On: 03/08/2021 15:21    Labs:  CBC: Recent Labs    03/10/21 0944 03/11/21 0005 03/12/21 0453 03/13/21 0404  WBC 4.3 5.8 4.7 4.2  HGB 9.6* 10.3* 10.1* 9.2*  HCT 29.2* 31.3* 31.1* 28.0*  PLT 182 210 174 164    COAGS: Recent Labs    07/20/20 0331 08/02/20 2020 08/31/20 2335 11/06/20 2355  INR 1.1 1.1 1.0 1.1  APTT 40* _0 BMP: Recent Labs    08/10/20 0545 08/10/20 0800 08/11/20 0654 08/12/20 0513 08/30/20 2249 03/10/21 0944 03/11/21 0005 03/12/21 0453 03/13/21 0404  NA SPECIMEN CONTAMINATED, UNABLE TO PERFORM TEST(S). 138 139 137   < > 138 139 138 138  K SPECIMEN CONTAMINATED, UNABLE TO PERFORM TEST(S). 3.6 3.8 3.9   < > 4.2 3.9 3.6 4.0  CL SPECIMEN CONTAMINATED, UNABLE TO PERFORM TEST(S). 109 109 111   < > 110 111 111 111  CO2 SPECIMEN CONTAMINATED, UNABLE TO PERFORM TEST(S). 21* 21* 21*   < > 21* 21* 21* 22  GLUCOSE SPECIMEN CONTAMINATED, UNABLE TO PERFORM TEST(S). 124* 116* 95   < > 102* 109* 106* 131*  BUN SPECIMEN CONTAMINATED, UNABLE TO PERFORM TEST(S). 13 21* 31*   < > 22* 18 14 26*  CALCIUM SPECIMEN CONTAMINATED, UNABLE TO PERFORM TEST(S). 10.0  10.4* 9.9   < > 7.5* 7.7* 7.0* 6.8*  CREATININE SPECIMEN CONTAMINATED, UNABLE TO PERFORM TEST(S). 1.40* 1.22* 1.25*   < > 1.03* 0.95 0.95 0.99  GFRNONAA SPECIMEN CONTAMINATED, UNABLE TO PERFORM TEST(S). 41* 48* 47*   < > >60 >60 >60 >60  GFRAA SPECIMEN CONTAMINATED, UNABLE TO PERFORM TEST(S). 48* 56* 55*  --   --   --   --   --    < > = values in this interval not displayed.    LIVER FUNCTION TESTS: Recent Labs    03/10/21 0944 03/11/21 0005 03/12/21 0453 03/13/21 0404  BILITOT 0.4 0.5 0.9 0.4  AST 50* 38 27 27  ALT 76* 65* 48* 42  ALKPHOS 96 94 98 77  PROT 7.1 7.3 6.8 5.7*  ALBUMIN 3.1* 3.0* 2.7* 2.3*     Assessment and Plan:  Crohn's disease complicated by short gut syndrome/malnutrition on TPN via right femoral tunneled CVC placed in IR 08/05/2020; with malfunction of tunneled CVC (dual lumen- one lumen chronically leaking and other lumen without blood return). Plan for image-guided tunneled CVC exchange/replacement today in IR. Patient requesting sedation for procedure- patient is NPO. Afebrile.  Risks and benefits discussed with the patient including, but not limited to bleeding, infection, vascular injury, air embolism or even death. All of the patient's questions were answered, patient is agreeable to proceed. Consent signed and in chart.   Thank you for this interesting consult.  I greatly enjoyed meeting Caitlin Peters and look forward to participating in their care.  A copy of this report was sent to the requesting provider on this date.  Electronically Signed: Earley Abide, PA-C 04/03/2021, 1:14 PM   I spent a total of 15 Minutes in face to face in clinical consultation, greater than 50% of which was counseling/coordinating care for tunneled central venous catheter malfunction/exchange.

## 2021-04-04 ENCOUNTER — Encounter: Payer: Self-pay | Admitting: Internal Medicine

## 2021-04-04 ENCOUNTER — Ambulatory Visit (INDEPENDENT_AMBULATORY_CARE_PROVIDER_SITE_OTHER): Payer: Medicare Other | Admitting: Internal Medicine

## 2021-04-04 VITALS — BP 110/64 | HR 65 | Temp 98.3°F | Wt 136.3 lb

## 2021-04-04 DIAGNOSIS — R5383 Other fatigue: Secondary | ICD-10-CM

## 2021-04-04 DIAGNOSIS — Z09 Encounter for follow-up examination after completed treatment for conditions other than malignant neoplasm: Secondary | ICD-10-CM

## 2021-04-04 DIAGNOSIS — E538 Deficiency of other specified B group vitamins: Secondary | ICD-10-CM | POA: Diagnosis not present

## 2021-04-04 DIAGNOSIS — E43 Unspecified severe protein-calorie malnutrition: Secondary | ICD-10-CM

## 2021-04-04 DIAGNOSIS — E559 Vitamin D deficiency, unspecified: Secondary | ICD-10-CM

## 2021-04-04 DIAGNOSIS — R531 Weakness: Secondary | ICD-10-CM

## 2021-04-04 DIAGNOSIS — N183 Chronic kidney disease, stage 3 unspecified: Secondary | ICD-10-CM

## 2021-04-04 DIAGNOSIS — M4644 Discitis, unspecified, thoracic region: Secondary | ICD-10-CM

## 2021-04-04 NOTE — Progress Notes (Signed)
Established Patient Office Visit     This visit occurred during the SARS-CoV-2 public health emergency.  Safety protocols were in place, including screening questions prior to the visit, additional usage of staff PPE, and extensive cleaning of exam room while observing appropriate contact time as indicated for disinfecting solutions.    CC/Reason for Visit: Hospital follow-up and follow-up chronic medical conditions  HPI: Caitlin Peters is a 60 y.o. female who is coming in today for the above mentioned reasons. 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  She is on TPN nightly.  In March she presented to the ED with back pain and was diagnosed with T2/T5 discitis.  She was placed on antibiotics through her PICC line.  She has completed those antibiotics.  She went to the emergency department on 4/26 as she was having vascular access issues.  They were able to flush the line successfully.  She then saw infectious diseases on 5/5.  There is plan to repeat the MRI of her thoracic spine to ensure resolution of discitis/abscess/phlegmon.  During that visit she was noted to have 1 port of her femoral line not functioning.  She was referred to radiology.  On 5/9 she had her PICC line exchange by interventional radiology.  She states that ever since this emergency department visit on 4/26 she has been "exhausted".  She has been sleeping a lot more, she has noticed some tingling of her fingertips and toes.  She was going to get her PICC line exchanged yesterday caused extreme fatigue and exhaustion.  She has labs drawn through her PICC line every Thursday, she has had issues with phlebotomy drawn labs in the recent past.   Past Medical/Surgical History: Past Medical History:  Diagnosis Date  . Acute pancreatitis 04/13/2020  . Anasarca 10/2019  .  AVN (avascular necrosis of bone) ([place])   . Cataract   . Choledocholithiasis (sludge) s/p ERCP 10/2019 10/21/2020  . Chronic pain syndrome   . CKD (chronic kidney disease), stage III ([place])   . Crohn disease ([place])   . Crohn's disease of small & large intestine with SGS 1984   Caitlin Peters is a 60year old female with a history of Crohn's disease diagnosed in 22 (age 19), history of long-term steroid use with osteoporosis, S/P multiple bowel resections ([id number]) complicated by chronic back and abdominal pain, steatorrhea and short bowel syndrome. The patient has been left with ~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 attached to proximal transverse colon through rectum. She has been  . Depression   . Diverticulosis   . GERD (gastroesophageal reflux disease)   . HTN (hypertension)   . IDA (iron deficiency anemia)   . Malnutrition ([place])   . Mass in chest   . Osteoporosis   . Osteoporosis 12/24/2014  . Pancreatitis   . 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; 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; jejunal to transverse colon anastomosis Treated at [place] SB lengthening to 165cm Dr Alene Mires, [facility]  ESTABLISHED AT [facility]  . Vitamin B12 deficiency     Past Surgical History:  Procedure Laterality Date  . ABDOMINAL ADHESION SURGERY  01/22/2018  . APPENDECTOMY  1989  . BILIARY DILATION  11/26/2019   Procedure: BILIARY DILATION;  Surgeon: Jackquline Denmark, MD;  Location:  [facility];  Service: Endoscopy;;  . BILIARY DILATION  03/08/2020   Procedure: BILIARY DILATION;  Surgeon: Rush Landmark Telford Nab., MD;  Location: [place];  Service: Gastroenterology;;  . BIOPSY  03/08/2020   Procedure: BIOPSY;  Surgeon: Irving Copas., MD;  Location: [place];  Service: Gastroenterology;;  . CHEST WALL RESECTION     right thoracotomy,resection of chest mass with anterior rib and reconstruction using prosthetic mesh and video  arthroscopy  . CHOLECYSTECTOMY  01/22/2018  . COLONOSCOPY  2019  . ENTEROSTOMY CLOSURE  04/1999  . ERCP N/A 11/26/2019   Procedure: ENDOSCOPIC RETROGRADE CHOLANGIOPANCREATOGRAPHY (ERCP);  Surgeon: Jackquline Denmark, MD;  Location: [facility];  Service: Endoscopy;  Laterality: N/A;  . ERCP N/A 03/08/2020   Procedure: ENDOSCOPIC RETROGRADE CHOLANGIOPANCREATOGRAPHY (ERCP);  Surgeon: Irving Copas., MD;  Location: [place];  Service: Gastroenterology;  Laterality: N/A;  . ESOPHAGOGASTRODUODENOSCOPY N/A 03/08/2020   Procedure: ESOPHAGOGASTRODUODENOSCOPY (EGD);  Surgeon: Irving Copas., MD;  Location: [place];  Service: Gastroenterology;  Laterality: N/A;  . EUS N/A 03/08/2020   Procedure: UPPER ENDOSCOPIC ULTRASOUND (EUS) LINEAR;  Surgeon: Irving Copas., MD;  Location: [place];  Service: Gastroenterology;  Laterality: N/A;  . ILEOCECETOMY  03/1999   ileocolon resection with abdominal stoma  . ILEOSTOMY CLOSURE  2001  . IR FLUORO GUIDE CV LINE LEFT  01/07/2020  . IR FLUORO GUIDE CV LINE LEFT  03/09/2020  . IR FLUORO GUIDE CV LINE LEFT  05/09/2020  . IR FLUORO GUIDE CV LINE LEFT  07/20/2020  . IR FLUORO GUIDE CV LINE RIGHT  08/05/2020  . IR FLUORO GUIDE CV LINE RIGHT  04/03/2021  . IR PTA VENOUS EXCEPT DIALYSIS CIRCUIT  01/07/2020  . IR REMOVAL TUN CV CATH W/O FL  08/05/2020  . IR US GUIDE VASC ACCESS LEFT     x 2 06/17/19 and 09/14/2019  . IR US GUIDE VASC ACCESS RIGHT  08/05/2020  . KNEE SURGERY     right knee   . LAPAROSCOPIC SMALL BOWEL RESECTION  2009   2000-2009.  SB resections for Crohns Disease - now with Short gut  . OMENTECTOMY  01/22/2018  . PARTIAL HYSTERECTOMY  1984   with LSO  . REMOVAL OF STONES  11/26/2019   Procedure: REMOVAL OF STONES;  Surgeon: Jackquline Denmark, MD;  Location: [facility];  Service: Endoscopy;;  . REMOVAL OF STONES  03/08/2020   Procedure: REMOVAL OF STONES;  Surgeon: Irving Copas., MD;  Location: [place];   Service: Gastroenterology;;  . SALPINGOOPHORECTOMY Left 1984  . SALPINGOOPHORECTOMY Right 1990  . SERIAL TRANSVERSE ENTEROPLASTY (STEP) - SMALL BOWEL LENGTHENING  01/22/2018   Dr Alene Mires, [place] length from 120 to 165cm   . SMALL INTESTINE SURGERY  2002  . SMALL INTESTINE SURGERY  2003  . SPHINCTEROTOMY  11/26/2019   Procedure: SPHINCTEROTOMY;  Surgeon: Jackquline Denmark, MD;  Location: [facility];  Service: Endoscopy;;  . TOTAL ABDOMINAL HYSTERECTOMY  1990   with RSO  . UPPER GASTROINTESTINAL ENDOSCOPY      Social History:  reports that she has quit smoking. She has never used smokeless tobacco. She reports previous alcohol use. She reports that she does not use drugs.  Allergies: Allergies  Allergen Reactions  . Meperidine Hives    Other reaction(s): GI Upset Due to [place]   . Hyoscyamine Hives and Swelling    Legs swelling   Disorientation  . Cefepime Other (See Comments)    Neurotoxicity occurring in setting of AKI.  Ceftriaxone tolerated during same admit  . Gabapentin Other (See Comments)    unknown  . Lyrica [Pregabalin] Other (See Comments)    unknown  . Topamax [Topiramate] Other (See Comments)    unknown  . Zosyn [Piperacillin Sod-Tazobactam So]     Patient reports it makes her vomit, her neck stiff, and her "heart feel funny"  . Fentanyl Rash    Pt is allergic to fentanyl patch related to the glue (gives her a rash) Pt states she is NOT allergic to fentanyl IV medicine  . Morphine And Related Rash    Family History:  Family History  Problem Relation Age of Onset  . Breast cancer Sister   . Multiple sclerosis Sister   . Diabetes Sister   . Lupus Sister   . Colon cancer Other   . Crohn's disease Other   . Seizures Mother   . Glaucoma Mother   . CAD Father   . Heart disease Father   . Hypertension Father      Current Outpatient Medications:  .  acetaminophen (TYLENOL) 325 MG tablet, Take 650 mg by mouth every 6 (six) hours as needed  for mild pain. , Disp: , Rfl:  .  amLODipine (NORVASC) 10 MG tablet, Take 10 mg by mouth daily., Disp: , Rfl:  .  budesonide (ENTOCORT EC) 3 MG 24 hr capsule, Take 3 capsules (9 mg total) by mouth daily. (Patient taking differently: Take 3 mg by mouth every other day.), Disp: 90 capsule, Rfl: 0 .  buPROPion (WELLBUTRIN SR) 100 MG 12 hr tablet, Take 200 mg by mouth daily., Disp: , Rfl:  .  Calcium 200 MG TABS, Take 200 mg by mouth daily., Disp: , Rfl:  .  cholecalciferol (VITAMIN D3) 25 MCG (1000 UT) tablet, Take 1,000 Units by mouth daily. , Disp: , Rfl:  .  cycloSPORINE (RESTASIS) 0.05 % ophthalmic emulsion, Place 1 drop into both eyes 2 (two) times daily., Disp: , Rfl:  .  denosumab (PROLIA) 60 MG/ML SOSY injection, Inject 60 mg into the skin every 6 (six) months. Injection was April 2022, Disp: , Rfl:  .  dexlansoprazole (DEXILANT) 60 MG capsule, Take 1 capsule (60 mg total) by mouth daily., Disp: 90 capsule, Rfl: 1 .  Dextran 70-Hypromellose 0.1-0.3 % SOLN, Place 1 drop into both eyes 4 (four) times daily., Disp: , Rfl:  .  diphenoxylate-atropine (LOMOTIL) 2.5-0.025 MG tablet, TAKE 1 TABLET BY MOUTH 4 TIMES DAILY AS NEEDED FOR DIARRHEA OR  LOOSE  STOOLS (Patient taking differently: Take 1-2 tablets by mouth 4 (four) times daily as needed for diarrhea or loose stools.), Disp: 90 tablet, Rfl: 1 .  DULoxetine (CYMBALTA) 30 MG capsule, TAKE 3 CAPSULES BY MOUTH ONCE DAILY (Patient taking differently: Take 90 mg by mouth daily.), Disp: 90 capsule, Rfl: 0 .  estradiol (ESTRACE) 2 MG tablet, Take 1 tablet (2 mg total) by mouth daily., Disp: 90 tablet, Rfl: 1 .  famotidine (PEPCID) 20 MG tablet, Take 20 mg by mouth daily as needed for heartburn or indigestion., Disp: , Rfl:  .  GATTEX 5 MG KIT, Inject 5 mLs into the skin daily., Disp: , Rfl:  .  hydrALAZINE (APRESOLINE) 50 MG tablet, Take 75 mg by mouth 3 (three) times daily., Disp: , Rfl:  .  HYDROmorphone (DILAUDID) 4 MG tablet, Take 4 mg by mouth 4  (four) times daily as needed for moderate pain. , Disp: , Rfl:  .  levETIRAcetam (KEPPRA) 500 MG tablet, Take 500 mg  by mouth 2 (two) times daily., Disp: , Rfl:  .  lipase/protease/amylase (CREON) 36000 UNITS CPEP capsule, Take 1 capsule (36,000 Units total) by mouth 3 (three) times daily as needed (with meals for digestion)., Disp: 180 capsule, Rfl: 0 .  loperamide (IMODIUM) 2 MG capsule, TAKE 1 CAPSULE BY MOUTH AS NEEDED FOR DIARRHEA OR LOOSE STOOLS (Patient taking differently: Take 2 mg by mouth as needed for diarrhea or loose stools. TAKE 1 CAPSULE BY MOUTH AS NEEDED FOR DIARRHEA OR LOOSE STOOLS), Disp: 60 capsule, Rfl: 2 .  methadone (DOLOPHINE) 5 MG tablet, Take 1 tablet (5 mg total) by mouth 5 (five) times daily., Disp: 30 tablet, Rfl: 0 .  metoprolol tartrate (LOPRESSOR) 100 MG tablet, Take 1 tablet (100 mg total) by mouth 2 (two) times daily. (Patient taking differently: Take by mouth 2 (two) times daily.), Disp: 90 tablet, Rfl: 1 .  Multiple Vitamins-Minerals (MULTIVITAMIN ADULT PO), Take 1 tablet by mouth daily., Disp: , Rfl:  .  NARCAN 4 MG/0.1ML LIQD nasal spray kit, Place 1 spray into the nose once as needed (overdose)., Disp: , Rfl:  .  PRESCRIPTION MEDICATION, Inject 1 each into the vein daily. Home TPN . [facility] [place] . 1 bag for 12 hours. [phone], Disp: , Rfl:  .  Probiotic Product (PROBIOTIC-10 PO), Take 1 capsule by mouth daily. , Disp: , Rfl:  .  promethazine (PHENERGAN) 25 MG tablet, TAKE 1 TABLET BY MOUTH EVERY 6 HOURS AS NEEDED FOR NAUSEA FOR VOMITING (Patient taking differently: Take 25 mg by mouth every 6 (six) hours as needed for nausea or vomiting.), Disp: 90 tablet, Rfl: 0 .  sodium chloride 0.9 % infusion, Inject 1 mL into the vein daily as needed (flush)., Disp: , Rfl:  .  sucralfate (CARAFATE) 1 GM/10ML suspension, Take 10 mLs (1 g total) by mouth 4 (four) times daily -  with meals and at bedtime., Disp: 420 mL, Rfl: 2 .  Trace Minerals  Cu-Mn-Se-Zn (TRALEMENT IV), Inject 1 mL into the vein See admin instructions. Used in TPN bag 4 times weekly, Disp: , Rfl:  .  vitamin B-12 (CYANOCOBALAMIN) 100 MCG tablet, Take 1,000 mcg by mouth daily. , Disp: , Rfl:   Review of Systems:  Constitutional: Denies fever, chills, diaphoresis. HEENT: Denies photophobia, eye pain, redness, hearing loss, ear pain, congestion, sore throat, rhinorrhea, sneezing, mouth sores, trouble swallowing, neck pain, neck stiffness and tinnitus.   Respiratory: Denies SOB, DOE, cough, chest tightness,  and wheezing.   Cardiovascular: Denies chest pain, palpitations and leg swelling.  Gastrointestinal: Denies nausea, vomiting, abdominal pain, diarrhea, constipation, blood in stool and abdominal distention.  Genitourinary: Denies dysuria, urgency, frequency, hematuria, flank pain and difficulty urinating.  Endocrine: Denies: hot or cold intolerance, sweats, changes in hair or nails, polyuria, polydipsia. Musculoskeletal: Denies myalgias, back pain, joint swelling, arthralgias and gait problem.  Skin: Denies pallor, rash and wound.  Neurological: Denies dizziness, seizures, syncope,  light-headedness, numbness and headaches.  Hematological: Denies adenopathy. Easy bruising, personal or family bleeding history  Psychiatric/Behavioral: Denies suicidal ideation, mood changes, confusion, nervousness, sleep disturbance and agitation    Physical Exam: Vitals:   04/04/21 1422  BP: 110/64  Pulse: 65  Temp: 98.3 F (36.8 C)  TempSrc: Oral  SpO2: 99%  Weight: 136 lb 4.8 oz (61.8 kg)    Body mass index is 21.35 kg/m.   Constitutional: NAD, calm, comfortable Eyes: PERRL, lids and conjunctivae normal, wears corrective lenses ENMT: Mucous membranes are moist.  Respiratory: clear to auscultation bilaterally, no wheezing, no crackles. Normal respiratory effort. No accessory muscle use.  Cardiovascular: Regular rate and rhythm, no murmurs / rubs / gallops. No  extremity edema.  Neurologic: Grossly intact and nonfocal Psychiatric: Normal judgment and insight. Alert and oriented x 3. Normal mood.    Impression and Plan:  Vitamin D deficiency -Check vitamin D levels  Vitamin B12 deficiency -Check vitamin B12 levels  Hospital discharge follow-up Fatigue, unspecified type Weakness generalized -She has not had any fever, she knows to go to the ED if she does due to her episodes of repeated bacteremia and sepsis. -I wonder if her thoracic spine issues have not resolved given her continued symptoms with now tingling of toes.  MRI is pending. -I will also add B12, TSH and D levels to labs that her home health will draw on Thursday.  Thoracic discitis -She has completed IV antibiotic therapy, she is under the care of infectious diseases who has ordered repeat T-spine MRI.  Severe protein-calorie malnutrition ([place]) -On daily TPN.  Stage 3 chronic kidney disease, unspecified whether stage 3a or 3b CKD ([name]) -Creatinine has been stable around 0.9-1.  Time spent: 33 minutes reviewing hospital records, interviewing and examining patient and formulating plan of care.    [name], MD [facility]

## 2021-04-06 ENCOUNTER — Encounter (HOSPITAL_COMMUNITY): Payer: Self-pay

## 2021-04-06 ENCOUNTER — Other Ambulatory Visit: Payer: Self-pay

## 2021-04-06 ENCOUNTER — Emergency Department (HOSPITAL_COMMUNITY)
Admission: EM | Admit: 2021-04-06 | Discharge: 2021-04-06 | Disposition: A | Discharge status: Home / Self Care | Payer: Medicare Other | Source: Home / Self Care | Attending: Emergency Medicine | Admitting: Emergency Medicine

## 2021-04-06 DIAGNOSIS — I129 Hypertensive chronic kidney disease with stage 1 through stage 4 chronic kidney disease, or unspecified chronic kidney disease: Secondary | ICD-10-CM | POA: Insufficient documentation

## 2021-04-06 DIAGNOSIS — G894 Chronic pain syndrome: Secondary | ICD-10-CM | POA: Diagnosis not present

## 2021-04-06 DIAGNOSIS — Z79899 Other long term (current) drug therapy: Secondary | ICD-10-CM | POA: Insufficient documentation

## 2021-04-06 DIAGNOSIS — R1084 Generalized abdominal pain: Secondary | ICD-10-CM | POA: Insufficient documentation

## 2021-04-06 DIAGNOSIS — R531 Weakness: Secondary | ICD-10-CM

## 2021-04-06 DIAGNOSIS — G40909 Epilepsy, unspecified, not intractable, without status epilepticus: Secondary | ICD-10-CM | POA: Diagnosis not present

## 2021-04-06 DIAGNOSIS — E876 Hypokalemia: Secondary | ICD-10-CM | POA: Diagnosis not present

## 2021-04-06 DIAGNOSIS — Z885 Allergy status to narcotic agent status: Secondary | ICD-10-CM | POA: Diagnosis not present

## 2021-04-06 DIAGNOSIS — Z87891 Personal history of nicotine dependence: Secondary | ICD-10-CM | POA: Insufficient documentation

## 2021-04-06 DIAGNOSIS — Z8616 Personal history of COVID-19: Secondary | ICD-10-CM | POA: Insufficient documentation

## 2021-04-06 DIAGNOSIS — D638 Anemia in other chronic diseases classified elsewhere: Secondary | ICD-10-CM | POA: Diagnosis not present

## 2021-04-06 DIAGNOSIS — K219 Gastro-esophageal reflux disease without esophagitis: Secondary | ICD-10-CM | POA: Insufficient documentation

## 2021-04-06 DIAGNOSIS — N183 Chronic kidney disease, stage 3 unspecified: Secondary | ICD-10-CM | POA: Insufficient documentation

## 2021-04-06 DIAGNOSIS — Z7952 Long term (current) use of systemic steroids: Secondary | ICD-10-CM | POA: Diagnosis not present

## 2021-04-06 DIAGNOSIS — Z820 Family history of epilepsy and other diseases of the nervous system: Secondary | ICD-10-CM | POA: Diagnosis not present

## 2021-04-06 DIAGNOSIS — Z833 Family history of diabetes mellitus: Secondary | ICD-10-CM | POA: Diagnosis not present

## 2021-04-06 DIAGNOSIS — E538 Deficiency of other specified B group vitamins: Secondary | ICD-10-CM | POA: Diagnosis not present

## 2021-04-06 DIAGNOSIS — E440 Moderate protein-calorie malnutrition: Secondary | ICD-10-CM | POA: Diagnosis not present

## 2021-04-06 DIAGNOSIS — E872 Acidosis: Secondary | ICD-10-CM | POA: Diagnosis not present

## 2021-04-06 DIAGNOSIS — Z888 Allergy status to other drugs, medicaments and biological substances status: Secondary | ICD-10-CM | POA: Diagnosis not present

## 2021-04-06 DIAGNOSIS — I10 Essential (primary) hypertension: Secondary | ICD-10-CM | POA: Diagnosis not present

## 2021-04-06 DIAGNOSIS — Z8249 Family history of ischemic heart disease and other diseases of the circulatory system: Secondary | ICD-10-CM | POA: Diagnosis not present

## 2021-04-06 DIAGNOSIS — F419 Anxiety disorder, unspecified: Secondary | ICD-10-CM | POA: Diagnosis not present

## 2021-04-06 DIAGNOSIS — R197 Diarrhea, unspecified: Secondary | ICD-10-CM | POA: Insufficient documentation

## 2021-04-06 DIAGNOSIS — K912 Postsurgical malabsorption, not elsewhere classified: Secondary | ICD-10-CM | POA: Diagnosis not present

## 2021-04-06 DIAGNOSIS — K5080 Crohn's disease of both small and large intestine without complications: Secondary | ICD-10-CM | POA: Diagnosis not present

## 2021-04-06 DIAGNOSIS — Z20822 Contact with and (suspected) exposure to covid-19: Secondary | ICD-10-CM | POA: Diagnosis not present

## 2021-04-06 DIAGNOSIS — Z959 Presence of cardiac and vascular implant and graft, unspecified: Secondary | ICD-10-CM | POA: Insufficient documentation

## 2021-04-06 DIAGNOSIS — M810 Age-related osteoporosis without current pathological fracture: Secondary | ICD-10-CM | POA: Diagnosis not present

## 2021-04-06 DIAGNOSIS — F32A Depression, unspecified: Secondary | ICD-10-CM | POA: Diagnosis not present

## 2021-04-06 LAB — COMPREHENSIVE METABOLIC PANEL
ALT: 23 U/L (ref 0–44)
AST: 18 U/L (ref 15–41)
Albumin: 2.7 g/dL — ABNORMAL LOW (ref 3.5–5.0)
Alkaline Phosphatase: 81 U/L (ref 38–126)
Anion gap: 4 — ABNORMAL LOW (ref 5–15)
BUN: 20 mg/dL (ref 6–20)
CO2: 20 mmol/L — ABNORMAL LOW (ref 22–32)
Calcium: 7.2 mg/dL — ABNORMAL LOW (ref 8.9–10.3)
Chloride: 116 mmol/L — ABNORMAL HIGH (ref 98–111)
Creatinine, Ser: 0.9 mg/dL (ref 0.44–1.00)
GFR, Estimated: >60 mL/min (ref >60)
Glucose, Bld: 101 mg/dL — ABNORMAL HIGH (ref 70–99)
Potassium: 3.8 mmol/L (ref 3.5–5.1)
Sodium: 140 mmol/L (ref 135–145)
Total Bilirubin: 0.4 mg/dL (ref 0.3–1.2)
Total Protein: 6.4 g/dL — ABNORMAL LOW (ref 6.5–8.1)

## 2021-04-06 LAB — CBC WITH DIFFERENTIAL/PLATELET
Abs Immature Granulocytes: 0 10*3/uL (ref 0.00–0.07)
Basophils Absolute: 0 10*3/uL (ref 0.0–0.1)
Basophils Relative: 1 %
Eosinophils Absolute: 0 10*3/uL (ref 0.0–0.5)
Eosinophils Relative: 1 %
HCT: 32.6 % — ABNORMAL LOW (ref 36.0–46.0)
Hemoglobin: 10.4 g/dL — ABNORMAL LOW (ref 12.0–15.0)
Immature Granulocytes: 0 %
Lymphocytes Relative: 47 %
Lymphs Abs: 2 10*3/uL (ref 0.7–4.0)
MCH: 29.3 pg (ref 26.0–34.0)
MCHC: 31.9 g/dL (ref 30.0–36.0)
MCV: 91.8 fL (ref 80.0–100.0)
Monocytes Absolute: 0.2 10*3/uL (ref 0.1–1.0)
Monocytes Relative: 5 %
Neutro Abs: 1.9 10*3/uL (ref 1.7–7.7)
Neutrophils Relative %: 46 %
Platelets: 213 10*3/uL (ref 150–400)
RBC: 3.55 MIL/uL — ABNORMAL LOW (ref 3.87–5.11)
RDW: 15.1 % (ref 11.5–15.5)
WBC: 4.2 10*3/uL (ref 4.0–10.5)
nRBC: 0 % (ref 0.0–0.2)

## 2021-04-06 LAB — URINALYSIS, ROUTINE W REFLEX MICROSCOPIC
Bilirubin Urine: NEGATIVE
Glucose, UA: NEGATIVE mg/dL
Hgb urine dipstick: NEGATIVE
Ketones, ur: NEGATIVE mg/dL
Nitrite: NEGATIVE
Protein, ur: >=300 mg/dL — AB
Specific Gravity, Urine: 1.01 (ref 1.005–1.030)
pH: 7 (ref 5.0–8.0)

## 2021-04-06 LAB — LIPASE, BLOOD: Lipase: 31 U/L (ref 11–51)

## 2021-04-06 MED ORDER — HYDROMORPHONE HCL 1 MG/ML IJ SOLN
1.0000 mg | Freq: Once | INTRAMUSCULAR | Status: AC
  Administered 2021-04-06: 1 mg via INTRAVENOUS
  Filled 2021-04-06: qty 1

## 2021-04-06 MED ORDER — LIDOCAINE VISCOUS HCL 2 % MT SOLN
15.0000 mL | Freq: Once | OROMUCOSAL | Status: DC
  Filled 2021-04-06: qty 15

## 2021-04-06 MED ORDER — SODIUM CHLORIDE 0.9 % IV BOLUS
1000.0000 mL | Freq: Once | INTRAVENOUS | Status: AC
  Administered 2021-04-06: 1000 mL via INTRAVENOUS

## 2021-04-06 MED ORDER — ONDANSETRON HCL 4 MG/2ML IJ SOLN
4.0000 mg | Freq: Once | INTRAMUSCULAR | Status: AC
  Administered 2021-04-06: 4 mg via INTRAVENOUS
  Filled 2021-04-06: qty 2

## 2021-04-06 MED ORDER — SODIUM CHLORIDE 0.9 % IV BOLUS
500.0000 mL | Freq: Once | INTRAVENOUS | Status: AC
  Administered 2021-04-06: 500 mL via INTRAVENOUS

## 2021-04-06 MED ORDER — ONDANSETRON 4 MG PO TBDP
4.0000 mg | ORAL_TABLET | Freq: Once | ORAL | Status: DC
  Filled 2021-04-06: qty 1

## 2021-04-06 MED ORDER — ALUM & MAG HYDROXIDE-SIMETH 200-200-20 MG/5ML PO SUSP
30.0000 mL | Freq: Once | ORAL | Status: DC
  Filled 2021-04-06: qty 30

## 2021-04-06 NOTE — ED Triage Notes (Signed)
Pt states decreased appetite x 1 week with weakness and fatigue. Pt reports diarrhea even with medication for chron's.

## 2021-04-06 NOTE — ED Provider Notes (Signed)
[facility] DEPT Provider Note   CSN: [id number] Arrival date & time: 04/06/21  1605     History Chief Complaint  Patient presents with  . Weakness    Caitlin Peters is a 60 y.o. female.    Patient with multiple medical issues including Crohn's disease, short gut syndrome presents with weakness, ongoing diarrhea.  She notes she always has abdominal pain, this is seemingly unchanged.  Over the past 3 days patient has had more weak than usual, without fever, without vomiting, though with decreased p.o. intake, and persistent diarrhea. She has been attempting to take her medication, use TPN, but has had less ability/willingness to do so recently.     Past Medical History:  Diagnosis Date  . Acute pancreatitis 04/13/2020  . Anasarca 10/2019  . AVN (avascular necrosis of bone) ([place])   . Cataract   . Choledocholithiasis (sludge) s/p ERCP 10/2019 10/21/2020  . Chronic pain syndrome   . CKD (chronic kidney disease), stage III ([place])   . Crohn disease ([place])   . Crohn's disease of small & large intestine with SGS 1984   Caitlin Peters is a 60year old female with a history of Crohn's disease diagnosed in 65 (age 19), history of long-term steroid use with osteoporosis, S/P multiple bowel resections (1700-1749) complicated by chronic back and abdominal pain, steatorrhea and short bowel syndrome. The patient has been left with ~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 attached to proximal transverse colon through rectum. She has been  . Depression   . Diverticulosis   . GERD (gastroesophageal reflux disease)   . HTN (hypertension)   . IDA (iron deficiency anemia)   . Malnutrition ([place])   . Mass in chest   . Osteoporosis   . Osteoporosis 12/24/2014  . Pancreatitis   . 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 Vitamin B12 deficiency     Patient Active Problem List   Diagnosis Date Noted  . Intractable nausea and vomiting 03/11/2021  . Thoracic discitis 02/10/2021  . Pressure injury of skin 12/18/2020  . Bacteremia 12/17/2020  . Seizures ([place]) 12/17/2020  . Drug-seeking behavior 12/16/2020  . Seizure ([place]) 12/15/2020  . CKD (chronic kidney disease) stage 3, GFR 30-59 ml/min ([name]) 12/06/2020  . COVID-19 virus infection 12/06/2020  . Protein calorie malnutrition ([place]) 12/06/2020  . Palpitations 11/22/2020  . PAC (premature atrial contraction) 11/22/2020  . History of central line-associated bloodstream infection (CLABSI) 10/21/2020  . Nausea & vomiting 10/21/2020  . Choledocholithiasis (sludge) s/p ERCP 10/2019 10/21/2020  . 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 Abdominal pain 09/07/2020  . Acute metabolic encephalopathy   . Hypomagnesemia   . Partial small bowel obstruction ([place])   . Bacteremia associated with intravascular line ([place]) 08/04/2020  . Enterobacter sepsis ([place]) 07/22/2020  . Anxiety 06/03/2020  . Acute pancreatitis 04/13/2020  . Infection due to Acinetobacter species 03/10/2020  . Pancytopenia ([place]) 03/05/2020  . Central line infection   . Elevated liver enzymes 01/02/2020  . Cholangitis   . Anasarca 10/28/2019  . Acute kidney injury superimposed on chronic kidney disease ([place]) 10/28/2019  . Falls 10/28/2019  . Malnutrition ([place])   . Vitamin B12 deficiency   . GERD (gastroesophageal reflux disease)   . Chronic pain syndrome   .  Hypokalemia due to excessive gastrointestinal loss of potassium 10/13/2019  . Fever   . Hypokalemia 10/12/2019  . Chronic diarrhea 10/12/2019  . Dysuria 10/12/2019  . Bilateral lower extremity edema 10/12/2019  . AKI (acute kidney injury) ([place]) 10/12/2019  . Fungemia 08/27/2019  . IDA (iron deficiency anemia) 11/03/2018  . Dilation of biliary tract  08/28/2018  . Severe diarrhea 03/07/2018  . LFTs abnormal 01/27/2018  . GI tract obstruction ([place]) 01/27/2018  . Gram-negative bacteremia 09/13/2017  . HTN (hypertension), benign 12/02/2016  . Intractable pain 12/02/2016  . Anemia 12/02/2016  . 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 Avascular necrosis ([place]) 06/14/2015  . Polyarthralgia 05/03/2015  . On total parenteral nutrition (TPN) 12/30/2014  . Osteoporosis 12/24/2014  . Low back pain 12/16/2014  . Vitamin D deficiency 12/16/2014  . 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 History of colonic diverticulitis 2014  . Depression 07/24/2012  . Gram-positive bacteremia 07/24/2012  . Small bowel obstruction due to adhesions ([place]) 07/24/2012  . Diarrhea 12/13/2011  . Neuralgia and neuritis 06/01/2011  . Myalgia and myositis 08/12/2003  . Crohn's disease of small & large intestine with SGS 1984    Past Surgical History:  Procedure Laterality Date  . ABDOMINAL ADHESION SURGERY  01/22/2018  . APPENDECTOMY  1989  . BILIARY DILATION  11/26/2019   Procedure: BILIARY DILATION;  Surgeon: Jackquline Denmark, MD;  Location: [facility];  Service: Endoscopy;;  . BILIARY DILATION  03/08/2020   Procedure: BILIARY DILATION;  Surgeon: Irving Copas., MD;  Location: [facility];  Service: Gastroenterology;;  . BIOPSY  03/08/2020   Procedure: BIOPSY;  Surgeon: Irving Copas., MD;  Location: [facility];  Service: Gastroenterology;;  . CHEST WALL RESECTION     right thoracotomy,resection of chest mass with anterior rib and reconstruction using prosthetic mesh and video arthroscopy  . CHOLECYSTECTOMY  01/22/2018  . COLONOSCOPY  2019  . ENTEROSTOMY CLOSURE  04/1999  . ERCP N/A 11/26/2019   Procedure: ENDOSCOPIC RETROGRADE CHOLANGIOPANCREATOGRAPHY (ERCP);  Surgeon: Jackquline Denmark, MD;  Location: [facility];  Service: Endoscopy;  Laterality: N/A;  . ERCP N/A 03/08/2020   Procedure: ENDOSCOPIC RETROGRADE CHOLANGIOPANCREATOGRAPHY (ERCP);  Surgeon:  Irving Copas., MD;  Location: [facility];  Service: Gastroenterology;  Laterality: N/A;  . ESOPHAGOGASTRODUODENOSCOPY N/A 03/08/2020   Procedure: ESOPHAGOGASTRODUODENOSCOPY (EGD);  Surgeon: Irving Copas., MD;  Location: [facility];  Service: Gastroenterology;  Laterality: N/A;  . EUS N/A 03/08/2020   Procedure: UPPER ENDOSCOPIC ULTRASOUND (EUS) LINEAR;  Surgeon: Irving Copas., MD;  Location: [place] [facility];  Service: Gastroenterology;  Laterality: N/A;  . ILEOCECETOMY  03/1999   ileocolon resection with abdominal stoma  . ILEOSTOMY CLOSURE  2001  . IR FLUORO GUIDE CV LINE LEFT  01/07/2020  . IR FLUORO GUIDE CV LINE LEFT  03/09/2020  . IR FLUORO GUIDE CV LINE LEFT  05/09/2020  . IR FLUORO GUIDE CV LINE LEFT  07/20/2020  . IR FLUORO GUIDE CV LINE RIGHT  08/05/2020  . IR FLUORO GUIDE CV LINE RIGHT  04/03/2021  . IR PTA VENOUS EXCEPT DIALYSIS CIRCUIT  01/07/2020  . IR REMOVAL TUN CV CATH W/O FL  08/05/2020  . IR US GUIDE VASC ACCESS LEFT     x 2 06/17/19 and 09/14/2019  . IR US GUIDE VASC ACCESS RIGHT  08/05/2020  . KNEE SURGERY     right knee   . LAPAROSCOPIC SMALL BOWEL RESECTION  2009   2000-2009.  SB resections for Crohns Disease - now with Short gut  .  OMENTECTOMY  01/22/2018  . PARTIAL HYSTERECTOMY  1984   with LSO  . REMOVAL OF STONES  11/26/2019   Procedure: REMOVAL OF STONES;  Surgeon: Jackquline Denmark, MD;  Location: [facility];  Service: Endoscopy;;  . REMOVAL OF STONES  03/08/2020   Procedure: REMOVAL OF STONES;  Surgeon: Irving Copas., MD;  Location: [facility];  Service: Gastroenterology;;  . SALPINGOOPHORECTOMY Left 1984  . SALPINGOOPHORECTOMY Right 1990  . SERIAL TRANSVERSE ENTEROPLASTY (STEP) - SMALL BOWEL LENGTHENING  01/22/2018   Dr Alene Mires, [place] length from 120 to 165cm   . SMALL INTESTINE SURGERY  2002  . SMALL INTESTINE SURGERY  2003  . SPHINCTEROTOMY  11/26/2019   Procedure: SPHINCTEROTOMY;  Surgeon: Jackquline Denmark,  MD;  Location: [facility];  Service: Endoscopy;;  . TOTAL ABDOMINAL HYSTERECTOMY  1990   with RSO  . UPPER GASTROINTESTINAL ENDOSCOPY       OB History   No obstetric history on file.     Family History  Problem Relation Age of Onset  . Breast cancer Sister   . Multiple sclerosis Sister   . Diabetes Sister   . Lupus Sister   . Colon cancer Other   . Crohn's disease Other   . Seizures Mother   . Glaucoma Mother   . CAD Father   . Heart disease Father   . Hypertension Father     Social History   Tobacco Use  . Smoking status: Former Research scientist (life sciences)  . Smokeless tobacco: Never Used  Vaping Use  . Vaping Use: Never used  Substance Use Topics  . Alcohol use: Not Currently  . Drug use: Never    Home Medications Prior to Admission medications   Medication Sig Start Date End Date Taking? Authorizing Provider  acetaminophen (TYLENOL) 325 MG tablet Take 650 mg by mouth every 6 (six) hours as needed for mild pain.     [provider]  amLODipine (NORVASC) 10 MG tablet Take 10 mg by mouth daily.    [provider]  budesonide (ENTOCORT EC) 3 MG 24 hr capsule Take 3 capsules (9 mg total) by mouth daily. Patient taking differently: Take 3 mg by mouth every other day. 03/06/21   [name], [name], MD  buPROPion [facility] SR) 100 MG 12 hr tablet Take 200 mg by mouth daily. 03/02/21   [provider]  Calcium 200 MG TABS Take 200 mg by mouth daily.    [provider]  cholecalciferol (VITAMIN D3) 25 MCG (1000 UT) tablet Take 1,000 Units by mouth daily.     [provider]  cycloSPORINE (RESTASIS) 0.05 % ophthalmic emulsion Place 1 drop into both eyes 2 (two) times daily.    [provider]  denosumab (PROLIA) 60 MG/ML SOSY injection Inject 60 mg into the skin every 6 (six) months. Injection was April 2022    [provider]  dexlansoprazole (DEXILANT) 60 MG capsule Take 1 capsule (60 mg total) by mouth daily. 12/28/20    [name], [name], MD  Dextran 70-Hypromellose 0.1-0.3 % SOLN Place 1 drop into both eyes 4 (four) times daily.    [provider]  diphenoxylate-atropine (LOMOTIL) 2.5-0.025 MG tablet TAKE 1 TABLET BY MOUTH 4 TIMES DAILY AS NEEDED FOR DIARRHEA OR  LOOSE  STOOLS Patient taking differently: Take 1-2 tablets by mouth 4 (four) times daily as needed for diarrhea or loose stools. 12/28/20   [name], [name], MD  DULoxetine (CYMBALTA) 30 MG capsule TAKE  3 CAPSULES BY MOUTH ONCE DAILY Patient taking differently: Take 90 mg by mouth daily. 03/22/21   [name], [name], MD  estradiol (ESTRACE) 2 MG tablet Take 1 tablet (2 mg total) by mouth daily. 12/07/20   [name], [name], MD  famotidine (PEPCID) 20 MG tablet Take 20 mg by mouth daily as needed for heartburn or indigestion.    [provider]  GATTEX 5 MG KIT Inject 5 mLs into the skin daily. 01/03/21   [provider]  hydrALAZINE (APRESOLINE) 50 MG tablet Take 75 mg by mouth 3 (three) times daily.    [provider]  HYDROmorphone (DILAUDID) 4 MG tablet Take 4 mg by mouth 4 (four) times daily as needed for moderate pain.  09/15/20   [provider]  levETIRAcetam (KEPPRA) 500 MG tablet Take 500 mg by mouth 2 (two) times daily.    [provider]  lipase/protease/amylase (CREON) 36000 UNITS CPEP capsule Take 1 capsule (36,000 Units total) by mouth 3 (three) times daily as needed (with meals for digestion). 06/28/20   [name], [name], MD  loperamide (IMODIUM) 2 MG capsule TAKE 1 CAPSULE BY MOUTH AS NEEDED FOR DIARRHEA OR LOOSE STOOLS Patient taking differently: Take 2 mg by mouth as needed for diarrhea or loose stools. TAKE 1 CAPSULE BY MOUTH AS NEEDED FOR DIARRHEA OR LOOSE STOOLS 01/20/21   [name], [name], MD  methadone (DOLOPHINE) 5 MG tablet Take 1 tablet (5 mg total) by mouth 5 (five) times daily. 06/30/20   Florencia Reasons, MD  metoprolol tartrate  (LOPRESSOR) 100 MG tablet Take 1 tablet (100 mg total) by mouth 2 (two) times daily. Patient taking differently: Take by mouth 2 (two) times daily. 02/21/21   [name], [name], MD  Multiple Vitamins-Minerals (MULTIVITAMIN ADULT PO) Take 1 tablet by mouth daily.    [provider]  NARCAN 4 MG/0.1ML LIQD nasal spray kit Place 1 spray into the nose once as needed (overdose). 10/14/20   [provider]  PRESCRIPTION MEDICATION Inject 1 each into the vein daily. Home TPN . [facility] . 1 bag for 12 hours. [phone]    [provider]  Probiotic Product (PROBIOTIC-10 PO) Take 1 capsule by mouth daily.     [provider]  promethazine (PHENERGAN) 25 MG tablet TAKE 1 TABLET BY MOUTH EVERY 6 HOURS AS NEEDED FOR NAUSEA FOR VOMITING Patient taking differently: Take 25 mg by mouth every 6 (six) hours as needed for nausea or vomiting. 03/22/21   [name], [name], MD  sodium chloride 0.9 % infusion Inject 1 mL into the vein daily as needed (flush). 10/16/20   [provider]  sucralfate (CARAFATE) 1 GM/10ML suspension Take 10 mLs (1 g total) by mouth 4 (four) times daily -  with meals and at bedtime. 06/28/20   [name], [name], MD  Trace Minerals Cu-Mn-Se-Zn (TRALEMENT IV) Inject 1 mL into the vein See admin instructions. Used in TPN bag 4 times weekly    [provider]  vitamin B-12 (CYANOCOBALAMIN) 100 MCG tablet Take 1,000 mcg by mouth daily.     [provider]    Allergies    Meperidine, Hyoscyamine, Cefepime, Gabapentin, Lyrica [pregabalin], Topamax [topiramate], Zosyn [piperacillin sod-tazobactam so], Fentanyl, and Morphine and related  Review of Systems   Review of Systems  Constitutional:       Per HPI, otherwise negative  HENT:  Per HPI, otherwise negative  Respiratory:       Per HPI, otherwise negative  Cardiovascular:       Per HPI, otherwise negative   Gastrointestinal: Positive for abdominal pain, diarrhea and nausea. Negative for vomiting.  Endocrine:       Negative aside from HPI  Genitourinary:       Neg aside from HPI   Musculoskeletal:       Per HPI, otherwise negative  Skin: Negative.   Allergic/Immunologic: Positive for immunocompromised state.  Neurological: Positive for weakness. Negative for syncope.    Physical Exam Updated Vital Signs BP (!) 174/69 (BP Location: Left Arm)   Pulse 73   Temp 98.6 F (37 C) (Oral)   Resp 16   Ht 5' 7"  (1.702 m)   Wt 61.2 kg   SpO2 100%   BMI 21.14 kg/m   Physical Exam Vitals and nursing note reviewed.  Constitutional:      General: She is not in acute distress.    Appearance: She is well-developed. She is ill-appearing. She is not toxic-appearing or diaphoretic.  HENT:     Head: Normocephalic and atraumatic.  Eyes:     Conjunctiva/sclera: Conjunctivae normal.  Cardiovascular:     Rate and Rhythm: Normal rate and regular rhythm.  Pulmonary:     Effort: Pulmonary effort is normal. No respiratory distress.     Breath sounds: Normal breath sounds. No stridor.  Abdominal:     General: There is no distension.     Comments: Mild diffuse tenderness, no guarding  Skin:    General: Skin is warm and dry.     Comments: Right leg PICC line in place, no surrounding erythema  Neurological:     Mental Status: She is alert and oriented to person, place, and time.     Cranial Nerves: No cranial nerve deficit.     ED Results / Procedures / Treatments   Labs (all labs ordered are listed, but only abnormal results are displayed) Labs Reviewed  CBC WITH DIFFERENTIAL/PLATELET  COMPREHENSIVE METABOLIC PANEL  LIPASE, BLOOD  URINALYSIS, ROUTINE W REFLEX MICROSCOPIC    EKG None  Radiology CT 4/22  IMPRESSION: 1. No acute abnormality in the abdomen or pelvis. 2. Extensive postsurgical changes involving the bowel. Scattered areas of fluid-filled bowel but no evidence for  obstruction and no focal bowel inflammation. 3. Right femoral central line that terminates in the upper IVC just below the inferior cavoatrial junction.  Procedures Procedures   Medications Ordered in ED Medications  ondansetron (ZOFRAN-ODT) disintegrating tablet 4 mg (has no administration in time range)  alum & mag hydroxide-simeth (MAALOX/MYLANTA) 200-200-20 MG/5ML suspension 30 mL (has no administration in time range)    And  lidocaine (XYLOCAINE) 2 % viscous mouth solution 15 mL (has no administration in time range)  sodium chloride 0.9 % bolus 1,000 mL (has no administration in time range)  HYDROmorphone (DILAUDID) injection 1 mg (has no administration in time range)  ondansetron (ZOFRAN) injection 4 mg (has no administration in time range)    ED Course  I have reviewed the triage vital signs and the nursing notes.  Pertinent labs & imaging results that were available during my care of the patient were reviewed by me and considered in my medical decision making (see chart for details).   Update:, Patient about the same  9:20 PM Following 2 L fluid resuscitation, multiple doses of antiemetics, analgesics, the patient is sitting upright, smiling, feeling better. Labs reviewed, discussed, vital signs  reviewed, considered.  Patient has no leukocytosis, no fever, suspicion for Crohn's/short gut syndrome continue to her symptoms.  Without evidence for acute new abdominal processes, bacteremia, sepsis, with her improvement here, as above with fluids, antiemetics, narcotics, the patient is discharged, to follow-up with her GI doctor.  Final Clinical Impression(s) / ED Diagnoses Final diagnoses:  Generalized abdominal pain  Weakness   MDM Number of Diagnoses or Management Options Generalized abdominal pain: established, worsening Weakness: established, worsening   Amount and/or Complexity of Data Reviewed Clinical lab tests: reviewed and ordered Tests in the medicine section  of CPT: ordered and reviewed Decide to obtain previous medical records or to obtain history from someone other than the patient: yes Review and summarize past medical records: yes Independent visualization of images, tracings, or specimens: yes  Risk of Complications, Morbidity, and/or Mortality Presenting problems: high Diagnostic procedures: high Management options: high  Critical Care Total time providing critical care: < 30 minutes  Patient Progress Patient progress: improved    Carmin Muskrat, MD 04/06/21 2122

## 2021-04-06 NOTE — ED Provider Notes (Signed)
Emergency Medicine Provider Triage Evaluation Note  Caitlin Peters , a 60 y.o. female  was evaluated in triage.  Has history of chronic Chrons.  Patient reports abdominal pain at baseline, abd pain today is not different than her usual.  Pt complains of decreased appetite and fatigue since Monday.  Does TPN and hydration nightly.  Endorses losing 8 pounds over the last week.  Patient vomited once in last 24 hours, described as stomach contents.  No bloody emesis or coffee-ground emesis.  PIC line to right leg.  Line was replaced on 04/03/21.  Patient or primary care provider on 5/10.  Review of Systems  Positive: Abdominal pain, fatigue, decreased appetite, heartburn, nausea, vomiting  Negative: Fever, chills, blood in stool, melena  Physical Exam  BP (!) 174/69 (BP Location: Left Arm)   Pulse 73   Temp 98.6 F (37 C) (Oral)   Resp 16   Ht 5' 7"  (1.702 m)   Wt 61.2 kg   SpO2 100%   BMI 21.14 kg/m  Gen:   Awake, no distress   Resp:  Normal effort, clear to auscultation bilaterally MSK:   Moves extremities without difficulty  Other:  Abdomen soft, nondistended, diffusely tender throughout  Medical Decision Making  Medically screening exam initiated at 4:29 PM.  Appropriate orders placed.  Caitlin Peters was informed that the remainder of the evaluation will be completed by another provider, this initial triage assessment does not replace that evaluation, and the importance of remaining in the ED until their evaluation is complete.  The patient appears stable so that the remainder of the work up may be completed by another provider.      Caitlin Peters 04/06/21 1636    Caitlin Muskrat, MD 04/06/21 1843

## 2021-04-06 NOTE — ED Notes (Signed)
Advised Pt we will need a urine specimen. Kavin Leech, NT

## 2021-04-06 NOTE — ED Notes (Signed)
ED Provider at bedside. 

## 2021-04-07 ENCOUNTER — Encounter (HOSPITAL_COMMUNITY): Payer: Self-pay

## 2021-04-07 ENCOUNTER — Inpatient Hospital Stay (HOSPITAL_COMMUNITY)
Admission: EM | Admit: 2021-04-07 | Discharge: 2021-04-15 | DRG: 386 | Disposition: A | Discharge status: Home / Self Care | Payer: Medicare Other | Attending: Internal Medicine | Admitting: Internal Medicine

## 2021-04-07 ENCOUNTER — Other Ambulatory Visit: Payer: Self-pay

## 2021-04-07 ENCOUNTER — Emergency Department (HOSPITAL_COMMUNITY): Payer: Medicare Other

## 2021-04-07 DIAGNOSIS — E876 Hypokalemia: Secondary | ICD-10-CM | POA: Diagnosis present

## 2021-04-07 DIAGNOSIS — Z83511 Family history of glaucoma: Secondary | ICD-10-CM

## 2021-04-07 DIAGNOSIS — K5080 Crohn's disease of both small and large intestine without complications: Principal | ICD-10-CM | POA: Diagnosis present

## 2021-04-07 DIAGNOSIS — R112 Nausea with vomiting, unspecified: Secondary | ICD-10-CM

## 2021-04-07 DIAGNOSIS — R197 Diarrhea, unspecified: Secondary | ICD-10-CM

## 2021-04-07 DIAGNOSIS — E538 Deficiency of other specified B group vitamins: Secondary | ICD-10-CM | POA: Diagnosis present

## 2021-04-07 DIAGNOSIS — D638 Anemia in other chronic diseases classified elsewhere: Secondary | ICD-10-CM | POA: Diagnosis present

## 2021-04-07 DIAGNOSIS — R569 Unspecified convulsions: Secondary | ICD-10-CM

## 2021-04-07 DIAGNOSIS — Z7952 Long term (current) use of systemic steroids: Secondary | ICD-10-CM

## 2021-04-07 DIAGNOSIS — K50818 Crohn's disease of both small and large intestine with other complication: Secondary | ICD-10-CM | POA: Diagnosis present

## 2021-04-07 DIAGNOSIS — Z885 Allergy status to narcotic agent status: Secondary | ICD-10-CM

## 2021-04-07 DIAGNOSIS — E872 Acidosis, unspecified: Secondary | ICD-10-CM | POA: Diagnosis present

## 2021-04-07 DIAGNOSIS — I10 Essential (primary) hypertension: Secondary | ICD-10-CM | POA: Diagnosis present

## 2021-04-07 DIAGNOSIS — D649 Anemia, unspecified: Secondary | ICD-10-CM | POA: Diagnosis not present

## 2021-04-07 DIAGNOSIS — Z20822 Contact with and (suspected) exposure to covid-19: Secondary | ICD-10-CM | POA: Diagnosis present

## 2021-04-07 DIAGNOSIS — Z820 Family history of epilepsy and other diseases of the nervous system: Secondary | ICD-10-CM

## 2021-04-07 DIAGNOSIS — Z832 Family history of diseases of the blood and blood-forming organs and certain disorders involving the immune mechanism: Secondary | ICD-10-CM

## 2021-04-07 DIAGNOSIS — F32A Depression, unspecified: Secondary | ICD-10-CM | POA: Diagnosis present

## 2021-04-07 DIAGNOSIS — N1831 Chronic kidney disease, stage 3a: Secondary | ICD-10-CM

## 2021-04-07 DIAGNOSIS — M810 Age-related osteoporosis without current pathological fracture: Secondary | ICD-10-CM | POA: Diagnosis present

## 2021-04-07 DIAGNOSIS — Z8249 Family history of ischemic heart disease and other diseases of the circulatory system: Secondary | ICD-10-CM

## 2021-04-07 DIAGNOSIS — K529 Noninfective gastroenteritis and colitis, unspecified: Secondary | ICD-10-CM | POA: Diagnosis not present

## 2021-04-07 DIAGNOSIS — K5010 Crohn's disease of large intestine without complications: Secondary | ICD-10-CM | POA: Diagnosis present

## 2021-04-07 DIAGNOSIS — F419 Anxiety disorder, unspecified: Secondary | ICD-10-CM | POA: Diagnosis not present

## 2021-04-07 DIAGNOSIS — K50819 Crohn's disease of both small and large intestine with unspecified complications: Secondary | ICD-10-CM

## 2021-04-07 DIAGNOSIS — K912 Postsurgical malabsorption, not elsewhere classified: Secondary | ICD-10-CM | POA: Diagnosis present

## 2021-04-07 DIAGNOSIS — G894 Chronic pain syndrome: Secondary | ICD-10-CM | POA: Diagnosis present

## 2021-04-07 DIAGNOSIS — E440 Moderate protein-calorie malnutrition: Secondary | ICD-10-CM | POA: Diagnosis present

## 2021-04-07 DIAGNOSIS — G40909 Epilepsy, unspecified, not intractable, without status epilepticus: Secondary | ICD-10-CM | POA: Diagnosis present

## 2021-04-07 DIAGNOSIS — Z6823 Body mass index (BMI) 23.0-23.9, adult: Secondary | ICD-10-CM

## 2021-04-07 DIAGNOSIS — K219 Gastro-esophageal reflux disease without esophagitis: Secondary | ICD-10-CM | POA: Diagnosis present

## 2021-04-07 DIAGNOSIS — N183 Chronic kidney disease, stage 3 unspecified: Secondary | ICD-10-CM | POA: Diagnosis present

## 2021-04-07 DIAGNOSIS — Z833 Family history of diabetes mellitus: Secondary | ICD-10-CM

## 2021-04-07 DIAGNOSIS — Z87891 Personal history of nicotine dependence: Secondary | ICD-10-CM

## 2021-04-07 DIAGNOSIS — Z79899 Other long term (current) drug therapy: Secondary | ICD-10-CM

## 2021-04-07 DIAGNOSIS — Z79891 Long term (current) use of opiate analgesic: Secondary | ICD-10-CM

## 2021-04-07 DIAGNOSIS — N184 Chronic kidney disease, stage 4 (severe): Secondary | ICD-10-CM | POA: Diagnosis present

## 2021-04-07 DIAGNOSIS — Z888 Allergy status to other drugs, medicaments and biological substances status: Secondary | ICD-10-CM

## 2021-04-07 DIAGNOSIS — F1120 Opioid dependence, uncomplicated: Secondary | ICD-10-CM | POA: Diagnosis present

## 2021-04-07 LAB — COMPREHENSIVE METABOLIC PANEL
ALT: 29 U/L (ref 0–44)
AST: 26 U/L (ref 15–41)
Albumin: 2.8 g/dL — ABNORMAL LOW (ref 3.5–5.0)
Albumin: 3.5 (ref 3.5–5.0)
Alkaline Phosphatase: 88 U/L (ref 38–126)
Anion gap: 6 (ref 5–15)
BUN: 17 mg/dL (ref 6–20)
CO2: 17 mmol/L — ABNORMAL LOW (ref 22–32)
Calcium: 7.3 mg/dL — ABNORMAL LOW (ref 8.9–10.3)
Calcium: 7.3 — AB (ref 8.7–10.7)
Chloride: 119 mmol/L — ABNORMAL HIGH (ref 98–111)
Creatinine, Ser: 1.02 mg/dL — ABNORMAL HIGH (ref 0.44–1.00)
GFR, Estimated: >60 mL/min (ref >60)
Glucose, Bld: 115 mg/dL — ABNORMAL HIGH (ref 70–99)
Potassium: 3.1 mmol/L — ABNORMAL LOW (ref 3.5–5.1)
Sodium: 142 mmol/L (ref 135–145)
Total Bilirubin: <0.1 mg/dL — ABNORMAL LOW (ref 0.3–1.2)
Total Protein: 6.4 g/dL — ABNORMAL LOW (ref 6.5–8.1)

## 2021-04-07 LAB — CBC WITH DIFFERENTIAL/PLATELET
Abs Immature Granulocytes: 0.01 10*3/uL (ref 0.00–0.07)
Basophils Absolute: 0 10*3/uL (ref 0.0–0.1)
Basophils Relative: 0 %
Eosinophils Absolute: 0 10*3/uL (ref 0.0–0.5)
Eosinophils Relative: 0 %
HCT: 33 % — ABNORMAL LOW (ref 36.0–46.0)
Hemoglobin: 10.9 g/dL — ABNORMAL LOW (ref 12.0–15.0)
Immature Granulocytes: 0 %
Lymphocytes Relative: 31 %
Lymphs Abs: 1.5 10*3/uL (ref 0.7–4.0)
MCH: 30.3 pg (ref 26.0–34.0)
MCHC: 33 g/dL (ref 30.0–36.0)
MCV: 91.7 fL (ref 80.0–100.0)
Monocytes Absolute: 0.2 10*3/uL (ref 0.1–1.0)
Monocytes Relative: 3 %
Neutro Abs: 3.1 10*3/uL (ref 1.7–7.7)
Neutrophils Relative %: 66 %
Platelets: 215 10*3/uL (ref 150–400)
RBC: 3.6 MIL/uL — ABNORMAL LOW (ref 3.87–5.11)
RDW: 15.1 % (ref 11.5–15.5)
WBC: 4.8 10*3/uL (ref 4.0–10.5)
nRBC: 0 % (ref 0.0–0.2)

## 2021-04-07 LAB — CBC: RBC: 3.58 — AB (ref 3.87–5.11)

## 2021-04-07 LAB — BASIC METABOLIC PANEL
Chloride: 110 — AB (ref 99–108)
Creatinine: 1 (ref <=1.1)
Glucose: 111
Potassium: 4.2 (ref 3.4–5.3)

## 2021-04-07 LAB — LIPID PANEL: Triglycerides: 114 (ref 40–160)

## 2021-04-07 LAB — MAGNESIUM: Magnesium: 1.4 mg/dL — ABNORMAL LOW (ref 1.7–2.4)

## 2021-04-07 LAB — CBC AND DIFFERENTIAL
HCT: 33 — AB (ref 36–46)
Hemoglobin: 10.9 — AB (ref 12.0–16.0)
Platelets: 241 (ref 150–399)
WBC: 4.3

## 2021-04-07 LAB — RESP PANEL BY RT-PCR (FLU A&B, COVID) ARPGX2
Influenza A by PCR: NEGATIVE
Influenza B by PCR: NEGATIVE
SARS Coronavirus 2 by RT PCR: NEGATIVE

## 2021-04-07 LAB — VITAMIN D 25 HYDROXY (VIT D DEFICIENCY, FRACTURES): Vit D, 25-Hydroxy: 20.9

## 2021-04-07 LAB — PHOSPHORUS: Phosphorus: 2 mg/dL — ABNORMAL LOW (ref 2.5–4.6)

## 2021-04-07 LAB — LIPASE, BLOOD: Lipase: 37 U/L (ref 11–51)

## 2021-04-07 LAB — TSH: TSH: 0.49 (ref <=5.90)

## 2021-04-07 MED ORDER — METOPROLOL TARTRATE 25 MG PO TABS
100.0000 mg | ORAL_TABLET | Freq: Two times a day (BID) | ORAL | Status: DC
  Administered 2021-04-07 – 2021-04-15 (×14): 100 mg via ORAL
  Filled 2021-04-07 (×15): qty 4

## 2021-04-07 MED ORDER — NALOXONE HCL 4 MG/0.1ML NA LIQD: 1.0000 | Freq: Once | NASAL | Status: DC | PRN

## 2021-04-07 MED ORDER — ENOXAPARIN SODIUM 40 MG/0.4ML IJ SOSY
40.0000 mg | PREFILLED_SYRINGE | INTRAMUSCULAR | Status: DC
  Administered 2021-04-07 – 2021-04-08 (×2): 40 mg via SUBCUTANEOUS
  Filled 2021-04-07 (×2): qty 0.4

## 2021-04-07 MED ORDER — BUDESONIDE 3 MG PO CPEP
3.0000 mg | ORAL_CAPSULE | ORAL | Status: DC
  Administered 2021-04-10 – 2021-04-14 (×3): 3 mg via ORAL
  Filled 2021-04-07 (×5): qty 1

## 2021-04-07 MED ORDER — HYDROMORPHONE HCL 1 MG/ML IJ SOLN
1.0000 mg | Freq: Once | INTRAMUSCULAR | Status: AC
  Administered 2021-04-07: 1 mg via INTRAVENOUS
  Filled 2021-04-07: qty 1

## 2021-04-07 MED ORDER — BUPROPION HCL ER (SR) 100 MG PO TB12
200.0000 mg | ORAL_TABLET | Freq: Every day | ORAL | Status: DC
  Administered 2021-04-10 – 2021-04-15 (×6): 200 mg via ORAL
  Filled 2021-04-07 (×9): qty 2

## 2021-04-07 MED ORDER — PANTOPRAZOLE SODIUM 40 MG PO TBEC
40.0000 mg | DELAYED_RELEASE_TABLET | Freq: Every day | ORAL | Status: DC
  Administered 2021-04-09 – 2021-04-15 (×7): 40 mg via ORAL
  Filled 2021-04-07 (×8): qty 1

## 2021-04-07 MED ORDER — CYCLOSPORINE 0.05 % OP EMUL
1.0000 [drp] | Freq: Two times a day (BID) | OPHTHALMIC | Status: DC
  Administered 2021-04-07 – 2021-04-15 (×16): 1 [drp] via OPHTHALMIC
  Filled 2021-04-07 (×17): qty 1

## 2021-04-07 MED ORDER — POTASSIUM CHLORIDE 10 MEQ/100ML IV SOLN
10.0000 meq | INTRAVENOUS | Status: AC
  Administered 2021-04-07 – 2021-04-08 (×4): 10 meq via INTRAVENOUS
  Filled 2021-04-07 (×4): qty 100

## 2021-04-07 MED ORDER — SODIUM CHLORIDE 0.9 % IV BOLUS
1000.0000 mL | Freq: Once | INTRAVENOUS | Status: AC
  Administered 2021-04-07: 1000 mL via INTRAVENOUS

## 2021-04-07 MED ORDER — VITAMIN D 25 MCG (1000 UNIT) PO TABS
1000.0000 [IU] | ORAL_TABLET | Freq: Every day | ORAL | Status: DC
  Administered 2021-04-10: 1000 [IU] via ORAL
  Filled 2021-04-07 (×3): qty 1

## 2021-04-07 MED ORDER — SODIUM CHLORIDE 0.9 % IV SOLN: INTRAVENOUS | Status: AC

## 2021-04-07 MED ORDER — ACETAMINOPHEN 325 MG PO TABS
650.0000 mg | ORAL_TABLET | Freq: Four times a day (QID) | ORAL | Status: DC | PRN
  Administered 2021-04-12: 650 mg via ORAL
  Filled 2021-04-07: qty 2

## 2021-04-07 MED ORDER — ADULT MULTIVITAMIN W/MINERALS CH
ORAL_TABLET | Freq: Every day | ORAL | Status: DC
  Administered 2021-04-10: 1 via ORAL
  Filled 2021-04-07 (×5): qty 1

## 2021-04-07 MED ORDER — ONDANSETRON HCL 4 MG/2ML IJ SOLN
4.0000 mg | Freq: Once | INTRAMUSCULAR | Status: AC
  Administered 2021-04-07: 4 mg via INTRAVENOUS
  Filled 2021-04-07: qty 2

## 2021-04-07 MED ORDER — METHADONE HCL 5 MG PO TABS
5.0000 mg | ORAL_TABLET | Freq: Every day | ORAL | Status: DC
  Administered 2021-04-07 – 2021-04-15 (×38): 5 mg via ORAL
  Filled 2021-04-07 (×38): qty 1

## 2021-04-07 MED ORDER — CALCIUM CARBONATE 1250 (500 CA) MG PO TABS
500.0000 mg | ORAL_TABLET | Freq: Every day | ORAL | Status: DC
  Administered 2021-04-10: 500 mg via ORAL
  Filled 2021-04-07 (×3): qty 1

## 2021-04-07 MED ORDER — POLYVINYL ALCOHOL 1.4 % OP SOLN
1.0000 [drp] | Freq: Four times a day (QID) | OPHTHALMIC | Status: DC
  Administered 2021-04-07 – 2021-04-15 (×28): 1 [drp] via OPHTHALMIC
  Filled 2021-04-07: qty 15

## 2021-04-07 MED ORDER — SODIUM CHLORIDE 0.9% FLUSH
3.0000 mL | Freq: Two times a day (BID) | INTRAVENOUS | Status: DC
  Administered 2021-04-07 – 2021-04-13 (×9): 3 mL via INTRAVENOUS

## 2021-04-07 MED ORDER — POTASSIUM CHLORIDE 10 MEQ/100ML IV SOLN
10.0000 meq | Freq: Once | INTRAVENOUS | Status: AC
  Administered 2021-04-07: 10 meq via INTRAVENOUS
  Filled 2021-04-07: qty 100

## 2021-04-07 MED ORDER — VITAMIN B-12 1000 MCG PO TABS
1000.0000 ug | ORAL_TABLET | Freq: Every day | ORAL | Status: DC
  Administered 2021-04-10: 1000 ug via ORAL
  Filled 2021-04-07 (×3): qty 1

## 2021-04-07 MED ORDER — ACETAMINOPHEN 650 MG RE SUPP: 650.0000 mg | Freq: Four times a day (QID) | RECTAL | Status: DC | PRN

## 2021-04-07 MED ORDER — DULOXETINE HCL 30 MG PO CPEP
90.0000 mg | ORAL_CAPSULE | Freq: Every day | ORAL | Status: DC
  Administered 2021-04-09 – 2021-04-15 (×7): 90 mg via ORAL
  Filled 2021-04-07 (×9): qty 3

## 2021-04-07 MED ORDER — LEVETIRACETAM 500 MG PO TABS
500.0000 mg | ORAL_TABLET | Freq: Two times a day (BID) | ORAL | Status: DC
  Filled 2021-04-07 (×3): qty 1

## 2021-04-07 MED ORDER — HYDRALAZINE HCL 25 MG PO TABS
75.0000 mg | ORAL_TABLET | Freq: Three times a day (TID) | ORAL | Status: DC
  Administered 2021-04-07 – 2021-04-15 (×23): 75 mg via ORAL
  Filled 2021-04-07 (×21): qty 3

## 2021-04-07 MED ORDER — PANCRELIPASE (LIP-PROT-AMYL) 36000-114000 UNITS PO CPEP
36000.0000 [IU] | ORAL_CAPSULE | Freq: Three times a day (TID) | ORAL | Status: DC | PRN
  Administered 2021-04-09 – 2021-04-15 (×3): 36000 [IU] via ORAL
  Filled 2021-04-07 (×6): qty 1

## 2021-04-07 MED ORDER — AMLODIPINE BESYLATE 5 MG PO TABS
10.0000 mg | ORAL_TABLET | Freq: Every day | ORAL | Status: DC
  Administered 2021-04-08 – 2021-04-15 (×8): 10 mg via ORAL
  Filled 2021-04-07 (×8): qty 2

## 2021-04-07 MED ORDER — HYDROMORPHONE HCL 1 MG/ML IJ SOLN
0.5000 mg | INTRAMUSCULAR | Status: DC | PRN
  Administered 2021-04-07 – 2021-04-08 (×5): 0.5 mg via INTRAVENOUS
  Filled 2021-04-07 (×5): qty 0.5

## 2021-04-07 MED ORDER — DIPHENOXYLATE-ATROPINE 2.5-0.025 MG PO TABS
1.0000 | ORAL_TABLET | Freq: Four times a day (QID) | ORAL | Status: DC | PRN
  Administered 2021-04-09: 1 via ORAL
  Filled 2021-04-07: qty 1

## 2021-04-07 MED ORDER — LOPERAMIDE HCL 2 MG PO CAPS
2.0000 mg | ORAL_CAPSULE | ORAL | Status: DC | PRN
  Administered 2021-04-09: 2 mg via ORAL
  Filled 2021-04-07: qty 1

## 2021-04-07 NOTE — ED Notes (Signed)
Pt requesting more pain and nausea meds, provider made aware

## 2021-04-07 NOTE — ED Provider Notes (Addendum)
[facility] DEPT Provider Note   CSN: [id number] Arrival date & time: 04/07/21  1207     History Chief Complaint  Patient presents with  . Abdominal Pain    Caitlin Peters is a 60 y.o. female.  Patient seen yesterday in the emergency department received IV fluids some pain medication antinausea medicine.  Had electrolytes checked without any significant abnormalities.  Patient had an admission in April 15 of this year for persistent problems that bring her in today.  Patient states that she is having worse diarrhea worse nausea and vomiting and worse pain.  Patient's concerned her electrolytes are going to get abnormal.  Patient states when this occurs she needs acute needs to come in for IV fluids pain control nausea control.  Patient does receive IV nutritional supplementation through her right groin triple-lumen catheter.  It is working fine.  Patient past medical history significant for Crohn's disease with short gut.  Chronic pain syndrome.  She states they would not admit her yesterday because her electrolytes were not abnormal.        Past Medical History:  Diagnosis Date  . Acute pancreatitis 04/13/2020  . Anasarca 10/2019  . AVN (avascular necrosis of bone) ([place])   . Cataract   . Choledocholithiasis (sludge) s/p ERCP 10/2019 10/21/2020  . Chronic pain syndrome   . CKD (chronic kidney disease), stage III ([place])   . Crohn disease ([place])   . Crohn's disease of small & large intestine with SGS 1984   Caitlin Peters is a 60year old female with a history of Crohn's disease diagnosed in 3 (age 30), history of long-term steroid use with osteoporosis, S/P multiple bowel resections ([id number]) complicated by chronic back and abdominal pain, steatorrhea and short bowel syndrome. The patient has been left with ~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 attached to proximal transverse colon through rectum. She has been  . Depression   . Diverticulosis   . GERD  (gastroesophageal reflux disease)   . HTN (hypertension)   . IDA (iron deficiency anemia)   . Malnutrition ([place])   . Mass in chest   . Osteoporosis   . Osteoporosis 12/24/2014  . Pancreatitis   . 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 Vitamin B12 deficiency     Patient Active Problem List   Diagnosis Date Noted  . Intractable nausea and vomiting 03/11/2021  . Thoracic discitis 02/10/2021  . Pressure injury of skin 12/18/2020  . Bacteremia 12/17/2020  . Seizures ([place]) 12/17/2020  . Drug-seeking behavior 12/16/2020  . Seizure ([place]) 12/15/2020  . CKD (chronic kidney disease) stage 3, GFR 30-59 ml/min ([name]) 12/06/2020  . COVID-19 virus infection 12/06/2020  . Protein calorie malnutrition ([place]) 12/06/2020  . Palpitations 11/22/2020  . PAC (premature atrial contraction) 11/22/2020  . History of central line-associated bloodstream infection (CLABSI) 10/21/2020  . Nausea & vomiting 10/21/2020  . Choledocholithiasis (sludge) s/p ERCP 10/2019 10/21/2020  . 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 Abdominal pain 09/07/2020  . Acute metabolic encephalopathy   . Hypomagnesemia   . Partial small bowel obstruction ([place])   . Bacteremia associated with intravascular line ([place]) 08/04/2020  . Enterobacter sepsis ([place]) 07/22/2020  . Anxiety 06/03/2020  . Acute pancreatitis 04/13/2020  . Infection due to Acinetobacter  species 03/10/2020  . Pancytopenia ([place]) 03/05/2020  . Central line infection   . Elevated liver enzymes 01/02/2020  . Cholangitis   . Anasarca 10/28/2019  . Acute kidney injury superimposed on chronic kidney disease ([place]) 10/28/2019  . Falls 10/28/2019  . Malnutrition ([place])   . Vitamin B12 deficiency   . GERD (gastroesophageal  reflux disease)   . Chronic pain syndrome   . Hypokalemia due to excessive gastrointestinal loss of potassium 10/13/2019  . Fever   . Hypokalemia 10/12/2019  . Chronic diarrhea 10/12/2019  . Dysuria 10/12/2019  . Bilateral lower extremity edema 10/12/2019  . AKI (acute kidney injury) ([place]) 10/12/2019  . Fungemia 08/27/2019  . IDA (iron deficiency anemia) 11/03/2018  . Dilation of biliary tract 08/28/2018  . Severe diarrhea 03/07/2018  . LFTs abnormal 01/27/2018  . GI tract obstruction ([place]) 01/27/2018  . Gram-negative bacteremia 09/13/2017  . HTN (hypertension), benign 12/02/2016  . Intractable pain 12/02/2016  . Anemia 12/02/2016  . 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 Avascular necrosis ([place]) 06/14/2015  . Polyarthralgia 05/03/2015  . On total parenteral nutrition (TPN) 12/30/2014  . Osteoporosis 12/24/2014  . Low back pain 12/16/2014  . Vitamin D deficiency 12/16/2014  . 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 History of colonic diverticulitis 2014  . Depression 07/24/2012  . Gram-positive bacteremia 07/24/2012  . Small bowel obstruction due to adhesions ([place]) 07/24/2012  . Diarrhea 12/13/2011  . Neuralgia and neuritis 06/01/2011  . Myalgia and myositis 08/12/2003  . Crohn's disease of small & large intestine with SGS 1984    Past Surgical History:  Procedure Laterality Date  . ABDOMINAL ADHESION SURGERY  01/22/2018  . APPENDECTOMY  1989  . BILIARY DILATION  11/26/2019   Procedure: BILIARY DILATION;  Surgeon: Jackquline Denmark, MD;  Location: [facility];  Service: Endoscopy;;  . BILIARY DILATION  03/08/2020   Procedure: BILIARY DILATION;  Surgeon: Irving Copas., MD;  Location: [facility];  Service: Gastroenterology;;  . BIOPSY  03/08/2020   Procedure: BIOPSY;  Surgeon: Irving Copas., MD;  Location: [facility];  Service: Gastroenterology;;  . CHEST WALL RESECTION     right thoracotomy,resection of chest mass with anterior rib and reconstruction using  prosthetic mesh and video arthroscopy  . CHOLECYSTECTOMY  01/22/2018  . COLONOSCOPY  2019  . ENTEROSTOMY CLOSURE  04/1999  . ERCP N/A 11/26/2019   Procedure: ENDOSCOPIC RETROGRADE CHOLANGIOPANCREATOGRAPHY (ERCP);  Surgeon: Jackquline Denmark, MD;  Location: [facility];  Service: Endoscopy;  Laterality: N/A;  . ERCP N/A 03/08/2020   Procedure: ENDOSCOPIC RETROGRADE CHOLANGIOPANCREATOGRAPHY (ERCP);  Surgeon: Irving Copas., MD;  Location: [facility];  Service: Gastroenterology;  Laterality: N/A;  . ESOPHAGOGASTRODUODENOSCOPY N/A 03/08/2020   Procedure: ESOPHAGOGASTRODUODENOSCOPY (EGD);  Surgeon: Irving Copas., MD;  Location: [facility];  Service: Gastroenterology;  Laterality: N/A;  . EUS N/A 03/08/2020   Procedure: UPPER ENDOSCOPIC ULTRASOUND (EUS) LINEAR;  Surgeon: Irving Copas., MD;  Location: [facility];  Service: Gastroenterology;  Laterality: N/A;  . ILEOCECETOMY  03/1999   ileocolon resection with abdominal stoma  . ILEOSTOMY CLOSURE  2001  . IR FLUORO GUIDE CV LINE LEFT  01/07/2020  . IR FLUORO GUIDE CV LINE LEFT  03/09/2020  . IR FLUORO GUIDE CV LINE LEFT  05/09/2020  . IR FLUORO GUIDE CV LINE LEFT  07/20/2020  . IR FLUORO GUIDE CV LINE RIGHT  08/05/2020  . IR FLUORO GUIDE CV LINE RIGHT  04/03/2021  . IR PTA VENOUS EXCEPT DIALYSIS CIRCUIT  01/07/2020  . IR REMOVAL  TUN CV CATH W/O FL  08/05/2020  . IR US GUIDE VASC ACCESS LEFT     x 2 06/17/19 and 09/14/2019  . IR US GUIDE VASC ACCESS RIGHT  08/05/2020  . KNEE SURGERY     right knee   . LAPAROSCOPIC SMALL BOWEL RESECTION  2009   2000-2009.  SB resections for Crohns Disease - now with Short gut  . OMENTECTOMY  01/22/2018  . PARTIAL HYSTERECTOMY  1984   with LSO  . REMOVAL OF STONES  11/26/2019   Procedure: REMOVAL OF STONES;  Surgeon: Jackquline Denmark, MD;  Location: [facility];  Service: Endoscopy;;  . REMOVAL OF STONES  03/08/2020   Procedure: REMOVAL OF STONES;  Surgeon: Irving Copas., MD;   Location: [facility];  Service: Gastroenterology;;  . SALPINGOOPHORECTOMY Left 1984  . SALPINGOOPHORECTOMY Right 1990  . SERIAL TRANSVERSE ENTEROPLASTY (STEP) - SMALL BOWEL LENGTHENING  01/22/2018   Dr Alene Mires, [place] length from 120 to 165cm   . SMALL INTESTINE SURGERY  2002  . SMALL INTESTINE SURGERY  2003  . SPHINCTEROTOMY  11/26/2019   Procedure: SPHINCTEROTOMY;  Surgeon: Jackquline Denmark, MD;  Location: [facility];  Service: Endoscopy;;  . TOTAL ABDOMINAL HYSTERECTOMY  1990   with RSO  . UPPER GASTROINTESTINAL ENDOSCOPY       OB History   No obstetric history on file.     Family History  Problem Relation Age of Onset  . Breast cancer Sister   . Multiple sclerosis Sister   . Diabetes Sister   . Lupus Sister   . Colon cancer Other   . Crohn's disease Other   . Seizures Mother   . Glaucoma Mother   . CAD Father   . Heart disease Father   . Hypertension Father     Social History   Tobacco Use  . Smoking status: Former Research scientist (life sciences)  . Smokeless tobacco: Never Used  Vaping Use  . Vaping Use: Never used  Substance Use Topics  . Alcohol use: Not Currently  . Drug use: Never    Home Medications Prior to Admission medications   Medication Sig Start Date End Date Taking? Authorizing Provider  acetaminophen (TYLENOL) 325 MG tablet Take 650 mg by mouth every 6 (six) hours as needed for mild pain.     [provider]  amLODipine (NORVASC) 10 MG tablet Take 10 mg by mouth daily.    [provider]  budesonide (ENTOCORT EC) 3 MG 24 hr capsule Take 3 capsules (9 mg total) by mouth daily. Patient taking differently: Take 3 mg by mouth every other day. 03/06/21   [name], [name], MD  buPROPion [facility] SR) 100 MG 12 hr tablet Take 200 mg by mouth daily. 03/02/21   [provider]  Calcium 200 MG TABS Take 200 mg by mouth daily.    [provider]  cholecalciferol (VITAMIN D3) 25 MCG (1000 UT) tablet Take 1,000 Units by  mouth daily.     [provider]  cycloSPORINE (RESTASIS) 0.05 % ophthalmic emulsion Place 1 drop into both eyes 2 (two) times daily.    [provider]  denosumab (PROLIA) 60 MG/ML SOSY injection Inject 60 mg into the skin every 6 (six) months. Injection was April 2022    [provider]  dexlansoprazole (DEXILANT) 60 MG capsule Take 1 capsule (60 mg total) by mouth daily. 12/28/20   [name], [name], MD  Dextran 70-Hypromellose 0.1-0.3 % SOLN Place 1 drop into  both eyes 4 (four) times daily.    [provider]  diphenoxylate-atropine (LOMOTIL) 2.5-0.025 MG tablet TAKE 1 TABLET BY MOUTH 4 TIMES DAILY AS NEEDED FOR DIARRHEA OR  LOOSE  STOOLS Patient taking differently: Take 1-2 tablets by mouth 4 (four) times daily as needed for diarrhea or loose stools. 12/28/20   [name], [name], MD  DULoxetine (CYMBALTA) 30 MG capsule TAKE 3 CAPSULES BY MOUTH ONCE DAILY Patient taking differently: Take 90 mg by mouth daily. 03/22/21   [name], [name], MD  estradiol (ESTRACE) 2 MG tablet Take 1 tablet (2 mg total) by mouth daily. 12/07/20   [name], [name], MD  famotidine (PEPCID) 20 MG tablet Take 20 mg by mouth daily as needed for heartburn or indigestion.    [provider]  GATTEX 5 MG KIT Inject 5 mLs into the skin daily. 01/03/21   [provider]  hydrALAZINE (APRESOLINE) 50 MG tablet Take 75 mg by mouth 3 (three) times daily.    [provider]  HYDROmorphone (DILAUDID) 4 MG tablet Take 4 mg by mouth 4 (four) times daily as needed for moderate pain.  09/15/20   [provider]  levETIRAcetam (KEPPRA) 500 MG tablet Take 500 mg by mouth 2 (two) times daily.    [provider]  lipase/protease/amylase (CREON) 36000 UNITS CPEP capsule Take 1 capsule (36,000 Units total) by mouth 3 (three) times daily as needed (with meals for digestion). 06/28/20   [name], [name], MD  loperamide  (IMODIUM) 2 MG capsule TAKE 1 CAPSULE BY MOUTH AS NEEDED FOR DIARRHEA OR LOOSE STOOLS Patient taking differently: Take 2 mg by mouth as needed for diarrhea or loose stools. TAKE 1 CAPSULE BY MOUTH AS NEEDED FOR DIARRHEA OR LOOSE STOOLS 01/20/21   [name], [name], MD  methadone (DOLOPHINE) 5 MG tablet Take 1 tablet (5 mg total) by mouth 5 (five) times daily. 06/30/20   Florencia Reasons, MD  metoprolol tartrate (LOPRESSOR) 100 MG tablet Take 1 tablet (100 mg total) by mouth 2 (two) times daily. Patient taking differently: Take by mouth 2 (two) times daily. 02/21/21   [name], [name], MD  Multiple Vitamins-Minerals (MULTIVITAMIN ADULT PO) Take 1 tablet by mouth daily.    [provider]  NARCAN 4 MG/0.1ML LIQD nasal spray kit Place 1 spray into the nose once as needed (overdose). 10/14/20   [provider]  PRESCRIPTION MEDICATION Inject 1 each into the vein daily. Home TPN . [facility] [place] . 1 bag for 12 hours. [phone]    [provider]  Probiotic Product (PROBIOTIC-10 PO) Take 1 capsule by mouth daily.     [provider]  promethazine (PHENERGAN) 25 MG tablet TAKE 1 TABLET BY MOUTH EVERY 6 HOURS AS NEEDED FOR NAUSEA FOR VOMITING Patient taking differently: Take 25 mg by mouth every 6 (six) hours as needed for nausea or vomiting. 03/22/21   [name], [name], MD  sodium chloride 0.9 % infusion Inject 1 mL into the vein daily as needed (flush). 10/16/20   [provider]  sucralfate (CARAFATE) 1 GM/10ML suspension Take 10 mLs (1 g total) by mouth 4 (four) times daily -  with meals and at bedtime. 06/28/20   [name], [name], MD  Trace Minerals Cu-Mn-Se-Zn (TRALEMENT IV) Inject 1 mL into the vein See admin instructions. Used in TPN bag 4 times weekly    [provider]  vitamin B-12 (CYANOCOBALAMIN)  100 MCG tablet Take 1,000 mcg by mouth daily.     [provider]    Allergies     Meperidine, Hyoscyamine, Cefepime, Gabapentin, Lyrica [pregabalin], Topamax [topiramate], Zosyn [piperacillin sod-tazobactam so], Fentanyl, and Morphine and related  Review of Systems   Review of Systems  Constitutional: Negative for chills and fever.  HENT: Negative for rhinorrhea and sore throat.   Eyes: Negative for visual disturbance.  Respiratory: Negative for cough and shortness of breath.   Cardiovascular: Negative for chest pain and leg swelling.  Gastrointestinal: Positive for abdominal pain, diarrhea, nausea and vomiting.  Genitourinary: Negative for dysuria.  Musculoskeletal: Negative for back pain and neck pain.  Skin: Negative for rash.  Neurological: Negative for dizziness, light-headedness and headaches.  Hematological: Does not bruise/bleed easily.  Psychiatric/Behavioral: Negative for confusion.    Physical Exam Updated Vital Signs BP (!) 174/80   Pulse 70   Temp 98.5 F (36.9 C) (Oral)   Resp 17   Ht 1.702 m (5' 7" )   Wt 61 kg   SpO2 97%   BMI 21.06 kg/m   Physical Exam Vitals and nursing note reviewed.  Constitutional:      General: She is not in acute distress.    Appearance: Normal appearance. She is well-developed. She is ill-appearing.  HENT:     Head: Normocephalic and atraumatic.     Mouth/Throat:     Mouth: Mucous membranes are dry.  Eyes:     Extraocular Movements: Extraocular movements intact.     Conjunctiva/sclera: Conjunctivae normal.     Pupils: Pupils are equal, round, and reactive to light.  Cardiovascular:     Rate and Rhythm: Normal rate and regular rhythm.     Heart sounds: No murmur heard.   Pulmonary:     Effort: Pulmonary effort is normal. No respiratory distress.     Breath sounds: Normal breath sounds.  Abdominal:     General: There is no distension.     Palpations: Abdomen is soft.     Tenderness: There is no abdominal tenderness.  Musculoskeletal:        General: No swelling.     Cervical back: Neck supple.      Comments: Triple-lumen catheter right thigh groin area  Skin:    General: Skin is warm and dry.  Neurological:     General: No focal deficit present.     Mental Status: She is alert and oriented to person, place, and time.     Cranial Nerves: No cranial nerve deficit.     Sensory: No sensory deficit.     ED Results / Procedures / Treatments   Labs (all labs ordered are listed, but only abnormal results are displayed) Labs Reviewed  COMPREHENSIVE METABOLIC PANEL - Abnormal; Notable for the following components:      Result Value   Potassium 3.1 (*)    Chloride 119 (*)    CO2 17 (*)    Glucose, Bld 115 (*)    Creatinine, Ser 1.02 (*)    Calcium 7.3 (*)    Total Protein 6.4 (*)    Albumin 2.8 (*)    Total Bilirubin <0.1 (*)    All other components within normal limits  CBC WITH DIFFERENTIAL/PLATELET - Abnormal; Notable for the following components:   RBC 3.60 (*)    Hemoglobin 10.9 (*)    HCT 33.0 (*)    All other components within normal limits  LIPASE, BLOOD    EKG None  Radiology CT Abdomen  Pelvis Wo Contrast  Result Date: 04/07/2021 CLINICAL DATA:  Abdominal pain since April. Nausea and vomiting. Weight loss. On TPN for short gut syndrome. Chronic diarrhea. Crohn disease. EXAM: CT ABDOMEN AND PELVIS WITHOUT CONTRAST TECHNIQUE: Multidetector CT imaging of the abdomen and pelvis was performed following the standard protocol without IV contrast. COMPARISON:  03/10/2021 FINDINGS: Lower chest: Right lower lobe calcified granuloma. Centrilobular emphysema. Normal heart size without pericardial or pleural effusion. Hepatobiliary: Normal noncontrast appearance of the liver. Pneumobilia. Cholecystectomy. Pancreas: Normal noncontrast appearance of the pancreas. Spleen: Normal in size, without focal abnormality. Adrenals/Urinary Tract: Normal adrenal glands. No renal calculi or hydronephrosis. No bladder calculi. Stomach/Bowel: Normal stomach, without wall thickening. Suspect partial  colectomy. The remaining rectosigmoid colon is thick walled and fluid-filled, including on 62/2. Extensive surgical sutures involving the small bowel, which is normal in caliber. Low sensitivity for enteritis secondary to lack of oral or IV contrast. Vascular/Lymphatic: Right femoral venous line terminates in the IVC at the level of the hepatic veins, similar. Normal caliber of the aorta and branch vessels. No abdominopelvic adenopathy. Reproductive: Hysterectomy.  No adnexal mass. Other: No significant free fluid.  Mild pelvic floor laxity. Musculoskeletal: Left femoral head avascular necrosis without collapse. Osteopenia. Presumed bone island in the left iliac wing. Mild convex left lumbar spine curvature. IMPRESSION: 1. Degraded exam secondary to lack of IV or oral contrast. 2. Distal colitis, which given the clinical history, is most likely due to inflammatory bowel disease. Infection could look similar. 3. Extensive surgical changes of enterotomy and partial colectomy. No bowel obstruction. 4. Left femoral head avascular necrosis. 5.  Emphysema (ICD10-J43.9). Electronically Signed   By: Abigail Miyamoto M.D.   On: 04/07/2021 16:00    Procedures Procedures   CRITICAL CARE Performed by: Fredia Sorrow Total critical care time: 45 minutes Critical care time was exclusive of separately billable procedures and treating other patients. Critical care was necessary to treat or prevent imminent or life-threatening deterioration. Critical care was time spent personally by me on the following activities: development of treatment plan with patient and/or surrogate as well as nursing, discussions with consultants, evaluation of patient's response to treatment, examination of patient, obtaining history from patient or surrogate, ordering and performing treatments and interventions, ordering and review of laboratory studies, ordering and review of radiographic studies, pulse oximetry and re-evaluation of patient's  condition.   Medications Ordered in ED Medications  0.9 %  sodium chloride infusion ( Intravenous Stopped 04/07/21 1747)  sodium chloride 0.9 % bolus 1,000 mL (0 mLs Intravenous Stopped 04/07/21 1623)  ondansetron (ZOFRAN) injection 4 mg (4 mg Intravenous Given 04/07/21 1421)  HYDROmorphone (DILAUDID) injection 1 mg (1 mg Intravenous Given 04/07/21 1422)  ondansetron (ZOFRAN) injection 4 mg (4 mg Intravenous Given 04/07/21 1533)  HYDROmorphone (DILAUDID) injection 1 mg (1 mg Intravenous Given 04/07/21 1533)  potassium chloride 10 mEq in 100 mL IVPB (0 mEq Intravenous Stopped 04/07/21 1747)  HYDROmorphone (DILAUDID) injection 1 mg (1 mg Intravenous Given 04/07/21 1747)  ondansetron (ZOFRAN) injection 4 mg (4 mg Intravenous Given 04/07/21 1747)    ED Course  I have reviewed the triage vital signs and the nursing notes.  Pertinent labs & imaging results that were available during my care of the patient were reviewed by me and considered in my medical decision making (see chart for details).    MDM Rules/Calculators/A&P  Patient clinically appears dehydrated.  Patient seen yesterday.  Electrolytes without significant abnormalities.  Today with some hypokalemia potassium 3.1.  CO2 17.  Patient received IV fluids received pain medicine antinausea medicine.  Patient still feeling very nauseated.  Pain not under control.  Patient states with the persistent diarrhea she is going to need to come in to prevent her electrolytes from getting worse.  Patient has a history of short gut secondary to Crohn's.  For the low potassium patient received 10 mEq of potassium IV will give a second dose of that.  Patient's renal function without significant normality.  GFR is greater than 60.  Albumin is low at 2.8.  CT scan done today does show some colitis in the distal colon area.  Certainly with patient with the persistent nausea not able to take anything orally potassium would only get worse.   Treating her with oral potassium will not be helpful.  Will discuss with hospitalist for admission.  Final Clinical Impression(s) / ED Diagnoses Final diagnoses:  Hypokalemia  Colitis  Nausea vomiting and diarrhea    Rx / DC Orders ED Discharge Orders    None       Fredia Sorrow, MD 04/07/21 1756    Fredia Sorrow, MD 04/07/21 1801    Fredia Sorrow, MD 04/07/21 [phone]

## 2021-04-07 NOTE — ED Notes (Signed)
Attempted to give report, Gilford Raid RN stated she would forward message to oncoming RN since it is shift change

## 2021-04-07 NOTE — ED Triage Notes (Signed)
Pt w hx of chron's, c/o LUQ pain since this am, unable to take meds. Was seen here yesterday for fluids

## 2021-04-07 NOTE — H&P (Signed)
History and Physical   Caitlin Peters ZHG:[id number] DOB: 01-Nov-1961 DOA: 04/07/2021  PCP: [name], [name], MD   Patient coming from: Home  Chief Complaint: Nausea vomiting abdominal pain, diarrhea  HPI: Caitlin Peters is a 60 y.o. 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  As above patient has had persistent abdominal pain since this morning and has been feeling unwell for some time.  She also reports nausea, vomiting, increase in changes to her diarrhea.  States her diarrhea has become more foul and somewhat more mucus containing.  She was seen in the ED yesterday and her lab work was reassuring and she seemed to improve with IV fluids as well as antiemetics and analgesics.  She was subsequently discharged.  She states she felt well on discharge at that time.  However today she has had the above abdominal pain and persistence of symptoms.  Lab abnormalities are now occurring in her blood work as below.  She is unable to tolerate p.o. and has issues with absorption due to short gut syndrome and is on chronic TPN.  She states that when her diarrhea has a changes in her lab started to get abnormal she typically needs IV support.   Of note she recently completed a course of antibiotics at the end of last month for discitis.  Has a follow-up MRI already scheduled.  She denies fevers, chills, chest pain, shortness of breath, constipation.  ED Course: Vital signs in the ED significant for blood pressure in the 196Q 229 systolic.  Lab work-up showed CMP with potassium 3.1, chloride 118,  bicarb 117, glucose 115.  Calcium 7.3 which improved considering albumin 2.8, protein 6.4.  CBC with hemoglobin of 10.9 which is stable.  Lipase normal.  Respiratory panel for flu and COVID pending.  CT abdomen pelvis was with decreased resolution due to noncontrast study but did show colitis.  Suspicious for IBD given her history however infection could look similar.  Prior surgical changes also noted.  As well as known left femoral head avascular necrosis.  Patient received IV fluids, antiemetics, pain control in the ED as well as some IV potassium.  Review of Systems: As per HPI otherwise all other systems reviewed and are negative.  Past Medical History:  Diagnosis Date  . Acute pancreatitis 04/13/2020  . Anasarca 10/2019  . AVN (avascular necrosis of bone) ([place])   . Cataract   . Choledocholithiasis (sludge) s/p ERCP 10/2019 10/21/2020  . Chronic pain syndrome   . CKD (chronic kidney disease), stage III ([place])   . Crohn disease ([place])   . Crohn's disease of small & large intestine with SGS 1984   Caitlin Peters is a 60year old female with a history of Crohn's disease diagnosed in 35 (age 8), history of long-term steroid use with osteoporosis, S/P multiple bowel resections ([id number]) complicated by chronic back and abdominal pain, steatorrhea and short bowel syndrome. The patient has been left with ~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 attached to proximal transverse colon through rectum. She has been  . Depression   . Diverticulosis   . GERD (gastroesophageal reflux disease)   . HTN (hypertension)   . IDA (iron deficiency anemia)   .  Malnutrition ([place])   . Mass in chest   . Osteoporosis   . Osteoporosis 12/24/2014  . Pancreatitis   . 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 Vitamin B12 deficiency     Past Surgical History:  Procedure Laterality Date  . ABDOMINAL ADHESION SURGERY  01/22/2018  . APPENDECTOMY  1989  . BILIARY DILATION  11/26/2019   Procedure: BILIARY DILATION;  Surgeon: Jackquline Denmark, MD;  Location: [facility];  Service: Endoscopy;;  . BILIARY DILATION  03/08/2020   Procedure: BILIARY DILATION;  Surgeon: Irving Copas., MD;  Location: [place];  Service: Gastroenterology;;  . BIOPSY  03/08/2020   Procedure: BIOPSY;  Surgeon: Irving Copas., MD;  Location: [place];  Service: Gastroenterology;;  . CHEST WALL RESECTION     right thoracotomy,resection of chest mass with anterior rib and reconstruction using prosthetic mesh and video arthroscopy  . CHOLECYSTECTOMY  01/22/2018  . COLONOSCOPY  2019  . ENTEROSTOMY CLOSURE  04/1999  . ERCP N/A 11/26/2019   Procedure: ENDOSCOPIC RETROGRADE CHOLANGIOPANCREATOGRAPHY (ERCP);  Surgeon: Jackquline Denmark, MD;  Location[facility];  Service: Endoscopy;  Laterality: N/A;  . ERCP N/A 03/08/2020   Procedure: ENDOSCOPIC RETROGRADE CHOLANGIOPANCREATOGRAPHY (ERCP);  Surgeon: Irving Copas., MD;  Location: [place];  Service: Gastroenterology;  Laterality: N/A;  . ESOPHAGOGASTRODUODENOSCOPY N/A 03/08/2020   Procedure: ESOPHAGOGASTRODUODENOSCOPY (EGD);  Surgeon: Irving Copas., MD;  Location: [place];  Service: Gastroenterology;  Laterality: N/A;  . EUS N/A 03/08/2020   Procedure: UPPER ENDOSCOPIC ULTRASOUND (EUS) LINEAR;  Surgeon: Irving Copas., MD;  Location: [place];  Service: Gastroenterology;  Laterality: N/A;  . ILEOCECETOMY  03/1999   ileocolon resection with abdominal stoma  . ILEOSTOMY CLOSURE  2001  . IR FLUORO GUIDE CV LINE LEFT  01/07/2020  . IR FLUORO GUIDE CV LINE LEFT  03/09/2020  . IR FLUORO GUIDE CV LINE LEFT  05/09/2020  . IR FLUORO GUIDE CV LINE LEFT  07/20/2020  . IR FLUORO GUIDE CV LINE RIGHT  08/05/2020  .  IR FLUORO GUIDE CV LINE RIGHT  04/03/2021  . IR PTA VENOUS EXCEPT DIALYSIS CIRCUIT  01/07/2020  . IR REMOVAL TUN CV CATH W/O FL  08/05/2020  . IR US GUIDE VASC ACCESS LEFT     x 2 06/17/19 and 09/14/2019  . IR US GUIDE VASC ACCESS RIGHT  08/05/2020  . KNEE SURGERY     right knee   . LAPAROSCOPIC SMALL BOWEL RESECTION  2009   2000-2009.  SB resections for Crohns Disease - now with Short gut  . OMENTECTOMY  01/22/2018  . PARTIAL HYSTERECTOMY  1984   with LSO  . REMOVAL OF STONES  11/26/2019   Procedure: REMOVAL OF STONES;  Surgeon: Jackquline Denmark, MD;  [facility];  Service: Endoscopy;;  . REMOVAL OF STONES  03/08/2020   Procedure: REMOVAL OF STONES;  Surgeon: Irving Copas., MD;  Location: [place];  Service: Gastroenterology;;  . SALPINGOOPHORECTOMY Left 1984  . SALPINGOOPHORECTOMY Right 1990  . SERIAL TRANSVERSE ENTEROPLASTY (STEP) - SMALL BOWEL LENGTHENING  01/22/2018   Dr Alene Mires, [place] length from 120 to 165cm   . SMALL INTESTINE SURGERY  2002  . SMALL INTESTINE SURGERY  2003  . SPHINCTEROTOMY  11/26/2019   Procedure: Joan Mayans;  Surgeon: Jackquline Denmark, MD;  Location[facility];  Service: Endoscopy;;  . TOTAL ABDOMINAL HYSTERECTOMY  1990   with RSO  . UPPER GASTROINTESTINAL ENDOSCOPY      Social History  reports that she has quit smoking. She has never used smokeless tobacco. She reports previous alcohol use. She reports that she does not use drugs.  Allergies  Allergen Reactions  . Meperidine Hives    Other reaction(s): GI Upset Due to [place]   . Hyoscyamine Hives and Swelling    Legs swelling   Disorientation  . Cefepime Other (See Comments)    Neurotoxicity occurring in setting of AKI. Ceftriaxone tolerated during same admit  . Gabapentin Other (See Comments)    unknown  . Lyrica [Pregabalin] Other (See Comments)    unknown  . Topamax [Topiramate] Other (See Comments)    unknown  . Zosyn [Piperacillin Sod-Tazobactam  So]     Patient reports it makes her vomit, her neck stiff, and her "heart feel funny"  . Fentanyl Rash    Pt is allergic to fentanyl patch related to the glue (gives her a rash) Pt states she is NOT allergic to fentanyl IV medicine  . Morphine And Related Rash    Family History  Problem Relation Age of Onset  . Breast cancer Sister   . Multiple sclerosis Sister   . Diabetes Sister   . Lupus Sister   . Colon cancer Other   . Crohn's disease Other   . Seizures Mother   . Glaucoma Mother   . CAD Father   . Heart disease Father   . Hypertension Father   Reviewed on admission  Prior to Admission medications   Medication Sig Start Date End Date Taking? Authorizing Provider  acetaminophen (TYLENOL) 325 MG tablet Take 650 mg by mouth every 6 (six) hours as needed for mild pain.     [provider]  amLODipine (NORVASC) 10 MG tablet Take 10 mg by mouth daily.    [provider]  budesonide (ENTOCORT EC) 3 MG 24 hr capsule Take 3 capsules (9 mg total) by mouth daily. Patient taking differently: Take 3 mg by mouth every other day. 03/06/21   [name], [name], MD  buPROPion [facility] SR) 100 MG 12 hr tablet Take 200 mg by mouth daily. 03/02/21   [provider]  Calcium 200 MG TABS Take 200 mg by mouth daily.    [provider]  cholecalciferol (VITAMIN D3) 25 MCG (1000 UT) tablet Take 1,000 Units by mouth daily.     [provider]  cycloSPORINE (RESTASIS) 0.05 % ophthalmic emulsion Place 1 drop into both eyes 2 (two) times daily.    [provider]  denosumab (PROLIA) 60 MG/ML SOSY injection Inject 60 mg into the skin every 6 (six) months. Injection was April 2022    [provider]  dexlansoprazole (DEXILANT) 60 MG capsule Take 1 capsule (60 mg total) by mouth daily. 12/28/20   [name], [name], MD  Dextran 70-Hypromellose 0.1-0.3 % SOLN Place 1 drop into both eyes 4 (four) times daily.    [provider]  diphenoxylate-atropine (LOMOTIL) 2.5-0.025 MG tablet TAKE 1 TABLET BY MOUTH 4 TIMES DAILY AS NEEDED FOR DIARRHEA OR  LOOSE  STOOLS Patient taking differently: Take 1-2 tablets by mouth 4 (four) times daily as needed for diarrhea or loose stools. 12/28/20   [name], [name], MD  DULoxetine (CYMBALTA) 30 MG capsule TAKE  3 CAPSULES BY MOUTH ONCE DAILY Patient taking differently: Take 90 mg by mouth daily. 03/22/21   [name], [name], MD  estradiol (ESTRACE) 2 MG tablet Take 1 tablet (2 mg total) by mouth daily. 12/07/20   [name], [name], MD  famotidine (PEPCID) 20 MG tablet Take 20 mg by mouth daily as needed for heartburn or indigestion.    [provider]  GATTEX 5 MG KIT Inject 5 mLs into the skin daily. 01/03/21   [provider]  hydrALAZINE (APRESOLINE) 50 MG tablet Take 75 mg by mouth 3 (three) times daily.    [provider]  HYDROmorphone (DILAUDID) 4 MG tablet Take 4 mg by mouth 4 (four) times daily as needed for moderate pain.  09/15/20   [provider]  levETIRAcetam (KEPPRA) 500 MG tablet Take 500 mg by mouth 2 (two) times daily.    [provider]  lipase/protease/amylase (CREON) 36000 UNITS CPEP capsule Take 1 capsule (36,000 Units total) by mouth 3 (three) times daily as needed (with meals for digestion). 06/28/20   [name], [name], MD  loperamide (IMODIUM) 2 MG capsule TAKE 1 CAPSULE BY MOUTH AS NEEDED FOR DIARRHEA OR LOOSE STOOLS Patient taking differently: Take 2 mg by mouth as needed for diarrhea or loose stools. TAKE 1 CAPSULE BY MOUTH AS NEEDED FOR DIARRHEA OR LOOSE STOOLS 01/20/21   [name], [name], MD  methadone (DOLOPHINE) 5 MG tablet Take 1 tablet (5 mg total) by mouth 5 (five) times daily. 06/30/20   Florencia Reasons, MD  metoprolol tartrate (LOPRESSOR) 100 MG tablet Take 1 tablet (100 mg total) by mouth 2 (two) times daily. Patient taking differently: Take by mouth 2 (two) times daily.  02/21/21   [name], [name], MD  Multiple Vitamins-Minerals (MULTIVITAMIN ADULT PO) Take 1 tablet by mouth daily.    [provider]  NARCAN 4 MG/0.1ML LIQD nasal spray kit Place 1 spray into the nose once as needed (overdose). 10/14/20   [provider]  PRESCRIPTION MEDICATION Inject 1 each into the vein daily. Home TPN . [facility] . 1 bag for 12 hours. [id number]    [provider]  Probiotic Product (PROBIOTIC-10 PO) Take 1 capsule by mouth daily.     [provider]  promethazine (PHENERGAN) 25 MG tablet TAKE 1 TABLET BY MOUTH EVERY 6 HOURS AS NEEDED FOR NAUSEA FOR VOMITING Patient taking differently: Take 25 mg by mouth every 6 (six) hours as needed for nausea or vomiting. 03/22/21   [name], [name], MD  sodium chloride 0.9 % infusion Inject 1 mL into the vein daily as needed (flush). 10/16/20   [provider]  sucralfate (CARAFATE) 1 GM/10ML suspension Take 10 mLs (1 g total) by mouth 4 (four) times daily -  with meals and at bedtime. 06/28/20   [name], [name], MD  Trace Minerals Cu-Mn-Se-Zn (TRALEMENT IV) Inject 1 mL into the vein See admin instructions. Used in TPN bag 4 times weekly    [provider]  vitamin B-12 (CYANOCOBALAMIN) 100 MCG tablet Take 1,000 mcg by mouth daily.     [provider]    Physical Exam: Vitals:   04/07/21 1700 04/07/21 1800 04/07/21 1845 04/07/21 1850  BP: (!) 174/80 (!) 173/82 (!) 146/91   Pulse: 70 78    Resp: _0 Temp:      TempSrc:      SpO2: 97% 98%  Weight:      Height:       Physical Exam Constitutional:      General: She is not in acute distress.    Appearance: Normal appearance.  HENT:     Head: Normocephalic and atraumatic.     Mouth/Throat:     Mouth: Mucous membranes are dry.     Pharynx: Oropharynx is clear.  Eyes:     Extraocular Movements: Extraocular movements intact.     Pupils: Pupils are  equal, round, and reactive to light.  Cardiovascular:     Rate and Rhythm: Normal rate and regular rhythm.     Pulses: Normal pulses.     Heart sounds: Normal heart sounds.  Pulmonary:     Effort: Pulmonary effort is normal. No respiratory distress.     Breath sounds: Normal breath sounds.  Abdominal:     General: Bowel sounds are normal. There is no distension.     Palpations: Abdomen is soft.     Tenderness: There is generalized abdominal tenderness.  Musculoskeletal:        General: No swelling or deformity.  Skin:    General: Skin is warm and dry.  Neurological:     General: No focal deficit present.     Mental Status: Mental status is at baseline.    Labs on Admission: I have personally reviewed following labs and imaging studies  CBC: Recent Labs  Lab 04/06/21 1637 04/07/21 1424  WBC 4.2 4.8  NEUTROABS 1.9 3.1  HGB 10.4* 10.9*  HCT 32.6* 33.0*  MCV 91.8 91.7  PLT 213 474    Basic Metabolic Panel: Recent Labs  Lab 04/06/21 1637 04/07/21 1424  NA 140 142  K 3.8 3.1*  CL 116* 119*  CO2 20* 17*  GLUCOSE 101* 115*  BUN 20 17  CREATININE 0.90 1.02*  CALCIUM 7.2* 7.3*  MG  --  1.4*  PHOS  --  2.0*    GFR: Estimated Creatinine Clearance: 56.5 mL/min (A) (by C-G formula based on SCr of 1.02 mg/dL (H)).  Liver Function Tests: Recent Labs  Lab 04/06/21 1637 04/07/21 1424  AST 18 26  ALT 23 29  ALKPHOS 81 88  BILITOT 0.4 <0.1*  PROT 6.4* 6.4*  ALBUMIN 2.7* 2.8*    Urine analysis:    Component Value Date/Time   COLORURINE YELLOW 04/06/2021 1637   APPEARANCEUR HAZY (A) 04/06/2021 1637   LABSPEC 1.010 04/06/2021 1637   PHURINE 7.0 04/06/2021 [place] 04/06/2021 [place] 04/06/2021 [place] 04/06/2021 [place] 04/06/2021 1637   PROTEINUR >=300 (A) 04/06/2021 1637   NITRITE NEGATIVE 04/06/2021 1637   LEUKOCYTESUR SMALL (A) 04/06/2021 1637    Radiological Exams on Admission: CT  Abdomen Pelvis Wo Contrast  Result Date: 04/07/2021 CLINICAL DATA:  Abdominal pain since April. Nausea and vomiting. Weight loss. On TPN for short gut syndrome. Chronic diarrhea. Crohn disease. EXAM: CT ABDOMEN AND PELVIS WITHOUT CONTRAST TECHNIQUE: Multidetector CT imaging of the abdomen and pelvis was performed following the standard protocol without IV contrast. COMPARISON:  03/10/2021 FINDINGS: Lower chest: Right lower lobe calcified granuloma. Centrilobular emphysema. Normal heart size without pericardial or pleural effusion. Hepatobiliary: Normal noncontrast appearance of the liver. Pneumobilia. Cholecystectomy. Pancreas: Normal noncontrast appearance of the pancreas. Spleen: Normal in size, without focal abnormality. Adrenals/Urinary Tract: Normal adrenal glands. No renal calculi or hydronephrosis. No bladder calculi. Stomach/Bowel: Normal stomach, without wall thickening. Suspect partial colectomy. The remaining rectosigmoid  colon is thick walled and fluid-filled, including on 62/2. Extensive surgical sutures involving the small bowel, which is normal in caliber. Low sensitivity for enteritis secondary to lack of oral or IV contrast. Vascular/Lymphatic: Right femoral venous line terminates in the IVC at the level of the hepatic veins, similar. Normal caliber of the aorta and branch vessels. No abdominopelvic adenopathy. Reproductive: Hysterectomy.  No adnexal mass. Other: No significant free fluid.  Mild pelvic floor laxity. Musculoskeletal: Left femoral head avascular necrosis without collapse. Osteopenia. Presumed bone island in the left iliac wing. Mild convex left lumbar spine curvature. IMPRESSION: 1. Degraded exam secondary to lack of IV or oral contrast. 2. Distal colitis, which given the clinical history, is most likely due to inflammatory bowel disease. Infection could look similar. 3. Extensive surgical changes of enterotomy and partial colectomy. No bowel obstruction. 4. Left femoral head  avascular necrosis. 5.  Emphysema (ICD10-J43.9). Electronically Signed   By: Abigail Miyamoto M.D.   On: 04/07/2021 16:00    EKG: Independently reviewed.  Last performed yesterday and was sinus rhythm at 81 bpm at that time with QTC at 458.  Also noted were PACs. Will repeat in AM.  Assessment/Plan Principal Problem:   Colitis Active Problems:   Hypokalemia   Chronic diarrhea   Crohn's disease of small & large intestine with SGS   GERD (gastroesophageal reflux disease)   Chronic pain syndrome   Anxiety   Anemia   Methadone dependence ([name])   CKD (chronic kidney disease) stage 3, GFR 30-59 ml/min ([name])   [name]  Colitis Crohn's disease Short gut syndrome > Presenting with persistent difficulty tolerating p.o. with nausea vomiting diarrhea.  Now with electrolyte abnormalities.  Will need IV supplementation and support as she has issues with absorption tolerating p.o. > For Crohn's disease is status post multiple bowel resections now with 120 cm of small bowel attached to proximal transverse colon resulting in short gut syndrome on chronic TPN - Monitor on telemetry - Continue supportive care with IV fluids, IV antiemetics, and IV analgesics - Trend CMP and WBC - Check GI panel - TPN per pharmacy - Check mag and Phos - Full liquid diet  Hypokalemia CKD 3 > Potassium 3.1 in ED.  Has received 20 mEq IV potassium in ED > Creatinine remained stable around 1, however does appear somewhat clinically dehydrated. - Continue with IV fluids - Give additional 40 mEq IV potassium - Check magnesium  Anemia > Chronic and stable at 10.9 for now - Continue to monitor  Anxiety Depression - Continue home Wellbutrin and duloxetine  Chronic pain > On chronic methadone and other pain medications. - Continue home methadone - As needed IV pain medications issue is difficulty tolerating p.o. right now - Continue home duloxetine  GERD - Continue home PPI  Hypertension > BP in the  076A systolic in the ED - Continue home hydralazine, amlodipine, metoprolol  Seizures  - Continue home Keppra  Discitis with recent completion of IV antibiotics - Noted, continue to monitor  DVT prophylaxis: Lovenox Code Status:   Full  Family Communication:  None on admission Disposition Plan:   Patient is from:  Home  Anticipated DC to:  Home  Anticipated DC date:  1 to 3 days  Anticipated DC barriers: None  Consults called:  None Admission status:  Observation, telemetry   Severity of Illness: The appropriate patient status for this patient is OBSERVATION. Observation status is judged to be reasonable and necessary in order to provide the  required intensity of service to ensure the patient's safety. [name] presenting symptoms, physical exam findings, and initial radiographic and laboratory data in the context of their medical condition is felt to place them at decreased risk for further clinical deterioration. Furthermore, it is anticipated that the patient will be medically stable for discharge from the hospital within 2 midnights of admission. The following factors support the patient status of observation.   " [name] presenting symptoms include nausea, no vomiting, diarrhea, abdominal pain. " The physical exam findings include diffuse abdominal tenderness. " The initial radiographic and laboratory data are CT scan with evidence of colitis, potassium 3.1, bicarb 17, glucose 115, calcium 7.3, albumin 2.8, hemoglobin 10.9.   Marcelyn Bruins MD [facility]  How to contact the [facility] Attending or Consulting provider [place] or covering provider during after hours [place], for this patient?   1. Check the care team in [facility] and look for a) attending/consulting [facility] provider listed and b) the [facility] team listed 2. Log into [email or web address] and use [facility] universal password to access. If you do not have the password, please contact the hospital operator. 3. Locate  the [facility] provider you are looking for under [facility] and page to a number that you can be directly reached. 4. If you still have difficulty reaching the provider, please page the [facility] (Director on Call) for the Hospitalists listed on amion for assistance.  04/07/2021, 8:26 PM

## 2021-04-08 DIAGNOSIS — D638 Anemia in other chronic diseases classified elsewhere: Secondary | ICD-10-CM | POA: Diagnosis present

## 2021-04-08 DIAGNOSIS — K219 Gastro-esophageal reflux disease without esophagitis: Secondary | ICD-10-CM | POA: Diagnosis present

## 2021-04-08 DIAGNOSIS — Z79899 Other long term (current) drug therapy: Secondary | ICD-10-CM | POA: Diagnosis not present

## 2021-04-08 DIAGNOSIS — K912 Postsurgical malabsorption, not elsewhere classified: Secondary | ICD-10-CM | POA: Diagnosis present

## 2021-04-08 DIAGNOSIS — G894 Chronic pain syndrome: Secondary | ICD-10-CM | POA: Diagnosis present

## 2021-04-08 DIAGNOSIS — E876 Hypokalemia: Secondary | ICD-10-CM | POA: Diagnosis present

## 2021-04-08 DIAGNOSIS — Z833 Family history of diabetes mellitus: Secondary | ICD-10-CM | POA: Diagnosis not present

## 2021-04-08 DIAGNOSIS — Z87891 Personal history of nicotine dependence: Secondary | ICD-10-CM | POA: Diagnosis not present

## 2021-04-08 DIAGNOSIS — E440 Moderate protein-calorie malnutrition: Secondary | ICD-10-CM | POA: Diagnosis present

## 2021-04-08 DIAGNOSIS — Z7952 Long term (current) use of systemic steroids: Secondary | ICD-10-CM | POA: Diagnosis not present

## 2021-04-08 DIAGNOSIS — F1120 Opioid dependence, uncomplicated: Secondary | ICD-10-CM | POA: Diagnosis not present

## 2021-04-08 DIAGNOSIS — E872 Acidosis, unspecified: Secondary | ICD-10-CM | POA: Diagnosis present

## 2021-04-08 DIAGNOSIS — E538 Deficiency of other specified B group vitamins: Secondary | ICD-10-CM | POA: Diagnosis present

## 2021-04-08 DIAGNOSIS — G40909 Epilepsy, unspecified, not intractable, without status epilepticus: Secondary | ICD-10-CM | POA: Diagnosis present

## 2021-04-08 DIAGNOSIS — Z8249 Family history of ischemic heart disease and other diseases of the circulatory system: Secondary | ICD-10-CM | POA: Diagnosis not present

## 2021-04-08 DIAGNOSIS — Z20822 Contact with and (suspected) exposure to covid-19: Secondary | ICD-10-CM | POA: Diagnosis present

## 2021-04-08 DIAGNOSIS — K5080 Crohn's disease of both small and large intestine without complications: Secondary | ICD-10-CM | POA: Diagnosis present

## 2021-04-08 DIAGNOSIS — Z885 Allergy status to narcotic agent status: Secondary | ICD-10-CM | POA: Diagnosis not present

## 2021-04-08 DIAGNOSIS — K50819 Crohn's disease of both small and large intestine with unspecified complications: Secondary | ICD-10-CM | POA: Diagnosis not present

## 2021-04-08 DIAGNOSIS — Z888 Allergy status to other drugs, medicaments and biological substances status: Secondary | ICD-10-CM | POA: Diagnosis not present

## 2021-04-08 DIAGNOSIS — Z820 Family history of epilepsy and other diseases of the nervous system: Secondary | ICD-10-CM | POA: Diagnosis not present

## 2021-04-08 DIAGNOSIS — F32A Depression, unspecified: Secondary | ICD-10-CM | POA: Diagnosis present

## 2021-04-08 DIAGNOSIS — Z7189 Other specified counseling: Secondary | ICD-10-CM | POA: Diagnosis not present

## 2021-04-08 DIAGNOSIS — M810 Age-related osteoporosis without current pathological fracture: Secondary | ICD-10-CM | POA: Diagnosis not present

## 2021-04-08 DIAGNOSIS — K529 Noninfective gastroenteritis and colitis, unspecified: Secondary | ICD-10-CM | POA: Diagnosis not present

## 2021-04-08 DIAGNOSIS — I10 Essential (primary) hypertension: Secondary | ICD-10-CM | POA: Diagnosis present

## 2021-04-08 DIAGNOSIS — F419 Anxiety disorder, unspecified: Secondary | ICD-10-CM | POA: Diagnosis present

## 2021-04-08 LAB — DIFFERENTIAL
Abs Immature Granulocytes: 0 10*3/uL (ref 0.00–0.07)
Basophils Absolute: 0 10*3/uL (ref 0.0–0.1)
Basophils Relative: 0 %
Eosinophils Absolute: 0 10*3/uL (ref 0.0–0.5)
Eosinophils Relative: 1 %
Immature Granulocytes: 0 %
Lymphocytes Relative: 47 %
Lymphs Abs: 1.5 10*3/uL (ref 0.7–4.0)
Monocytes Absolute: 0.2 10*3/uL (ref 0.1–1.0)
Monocytes Relative: 5 %
Neutro Abs: 1.5 10*3/uL — ABNORMAL LOW (ref 1.7–7.7)
Neutrophils Relative %: 47 %

## 2021-04-08 LAB — CBC
HCT: 30.9 % — ABNORMAL LOW (ref 36.0–46.0)
Hemoglobin: 9.8 g/dL — ABNORMAL LOW (ref 12.0–15.0)
MCH: 29.9 pg (ref 26.0–34.0)
MCHC: 31.7 g/dL (ref 30.0–36.0)
MCV: 94.2 fL (ref 80.0–100.0)
Platelets: 192 10*3/uL (ref 150–400)
RBC: 3.28 MIL/uL — ABNORMAL LOW (ref 3.87–5.11)
RDW: 15.4 % (ref 11.5–15.5)
WBC: 3.2 10*3/uL — ABNORMAL LOW (ref 4.0–10.5)
nRBC: 0 % (ref 0.0–0.2)

## 2021-04-08 LAB — PHOSPHORUS: Phosphorus: 1.6 mg/dL — ABNORMAL LOW (ref 2.5–4.6)

## 2021-04-08 LAB — COMPREHENSIVE METABOLIC PANEL
ALT: 34 U/L (ref 0–44)
AST: 29 U/L (ref 15–41)
Albumin: 2.6 g/dL — ABNORMAL LOW (ref 3.5–5.0)
Alkaline Phosphatase: 78 U/L (ref 38–126)
Anion gap: 5 (ref 5–15)
BUN: 14 mg/dL (ref 6–20)
CO2: 18 mmol/L — ABNORMAL LOW (ref 22–32)
Calcium: 6.7 mg/dL — ABNORMAL LOW (ref 8.9–10.3)
Chloride: 114 mmol/L — ABNORMAL HIGH (ref 98–111)
Creatinine, Ser: 1 mg/dL (ref 0.44–1.00)
GFR, Estimated: >60 mL/min (ref >60)
Glucose, Bld: 119 mg/dL — ABNORMAL HIGH (ref 70–99)
Potassium: 3.8 mmol/L (ref 3.5–5.1)
Sodium: 137 mmol/L (ref 135–145)
Total Bilirubin: 0.5 mg/dL (ref 0.3–1.2)
Total Protein: 5.9 g/dL — ABNORMAL LOW (ref 6.5–8.1)

## 2021-04-08 LAB — PREALBUMIN: Prealbumin: 26.9 mg/dL (ref 18–38)

## 2021-04-08 LAB — MAGNESIUM: Magnesium: 1.5 mg/dL — ABNORMAL LOW (ref 1.7–2.4)

## 2021-04-08 LAB — TRIGLYCERIDES: Triglycerides: 121 mg/dL (ref <150)

## 2021-04-08 LAB — GLUCOSE, CAPILLARY: Glucose-Capillary: 144 mg/dL — ABNORMAL HIGH (ref 70–99)

## 2021-04-08 MED ORDER — ONDANSETRON HCL 4 MG/2ML IJ SOLN
4.0000 mg | Freq: Four times a day (QID) | INTRAMUSCULAR | Status: DC | PRN
  Administered 2021-04-09 – 2021-04-15 (×22): 4 mg via INTRAVENOUS
  Filled 2021-04-08 (×23): qty 2

## 2021-04-08 MED ORDER — CHLORHEXIDINE GLUCONATE CLOTH 2 % EX PADS
6.0000 | MEDICATED_PAD | Freq: Every day | CUTANEOUS | Status: DC
  Administered 2021-04-08 – 2021-04-15 (×8): 6 via TOPICAL

## 2021-04-08 MED ORDER — INSULIN ASPART 100 UNIT/ML IJ SOLN
0.0000 [IU] | Freq: Four times a day (QID) | INTRAMUSCULAR | Status: DC
  Administered 2021-04-09 – 2021-04-13 (×5): 1 [IU] via SUBCUTANEOUS

## 2021-04-08 MED ORDER — ONDANSETRON HCL 4 MG/2ML IJ SOLN
4.0000 mg | Freq: Once | INTRAMUSCULAR | Status: AC
  Administered 2021-04-08: 4 mg via INTRAVENOUS
  Filled 2021-04-08: qty 2

## 2021-04-08 MED ORDER — MAGNESIUM SULFATE 2 GM/50ML IV SOLN
2.0000 g | Freq: Once | INTRAVENOUS | Status: AC
  Administered 2021-04-08: 2 g via INTRAVENOUS
  Filled 2021-04-08: qty 50

## 2021-04-08 MED ORDER — HYDROMORPHONE HCL 1 MG/ML IJ SOLN
0.5000 mg | INTRAMUSCULAR | Status: DC | PRN
Start: 2021-04-08 — End: 2021-04-12
  Administered 2021-04-08 – 2021-04-12 (×38): 0.5 mg via INTRAVENOUS
  Filled 2021-04-08 (×38): qty 0.5

## 2021-04-08 MED ORDER — POTASSIUM PHOSPHATES 15 MMOLE/5ML IV SOLN
20.0000 mmol | Freq: Once | INTRAVENOUS | Status: AC
  Administered 2021-04-08: 20 mmol via INTRAVENOUS
  Filled 2021-04-08: qty 6.67

## 2021-04-08 MED ORDER — SODIUM CHLORIDE 0.9% FLUSH
10.0000 mL | Freq: Two times a day (BID) | INTRAVENOUS | Status: DC
  Administered 2021-04-08 – 2021-04-15 (×11): 10 mL

## 2021-04-08 MED ORDER — SODIUM CHLORIDE 0.9% FLUSH
10.0000 mL | INTRAVENOUS | Status: DC | PRN
  Administered 2021-04-14: 10 mL

## 2021-04-08 MED ORDER — ONDANSETRON HCL 4 MG/2ML IJ SOLN
4.0000 mg | Freq: Four times a day (QID) | INTRAMUSCULAR | Status: AC | PRN
  Administered 2021-04-08 (×2): 4 mg via INTRAVENOUS
  Filled 2021-04-08 (×2): qty 2

## 2021-04-08 MED ORDER — TRAVASOL 10 % IV SOLN
INTRAVENOUS | Status: AC
  Filled 2021-04-08: qty 846

## 2021-04-08 MED ORDER — LEVETIRACETAM IN NACL 500 MG/100ML IV SOLN
500.0000 mg | Freq: Two times a day (BID) | INTRAVENOUS | Status: DC
  Administered 2021-04-08 – 2021-04-10 (×5): 500 mg via INTRAVENOUS
  Filled 2021-04-08 (×6): qty 100

## 2021-04-08 NOTE — Progress Notes (Addendum)
[facility] PROGRESS NOTE    Caitlin Peters  DGL:[id number] DOB: 1961-04-04 DOA: 04/07/2021 PCP: [name], [name], MD      Brief Narrative:  Caitlin Peters is a 60 y.o. F with complicated Crohn's disease, repeat bowel resec, on Budesonide followed by [facility] Dr. Domenica Fail, now with short gut syndrome, TPN dependent, output managed with Gattex, chronic diffuse pain on methadone and PO Dilaudid, and moderate protein calorie malnutrition who presented with anorexia, vomiting/nausea, foul stools and severe abdominal pain.  In the ER, found to have hypokalemia, hypophosphatemia, hypomagnesemia and acidosis.         Assessment & Plan:  Hypokalemia Hypophosphatemia Hypomagnesemia Non-anion gap metabolic acidosis due to diarrhea Continues to require IV electrolyte supplementation -Supplement IV Kphos and IV Mag -Trend Electrolytes   Nausea, vomiting and diarrhea and abdominal pain Crohn's disease Short gut syndrome N/V/D and abdominal pain flares seem to be a relapsing issue in the last year.    Since her discharge for discitis, the patient was well for about 4-6 weeks (eating well, functional, walking the dog, stools 3-4 per day only).  Then in the last 2 weeks, has started to have decreased appetite, nausea again, then vomiting, then foul frequent stools and increased generalized abdominal pain, worse after stools.   No hematochezia.  On the last similar episode in Nov 2021, she was transferred to [facility], underwent endoscopy that was normal. Her pain and vomiting resolved  with time, and she was discharged home on previous regimen.  Will rule our infectious colitis, but I doubt flare Crohn's.  Her GI at [facility] felt this was functional in nature.  - Continue IV fluids - Continue IV anti-emetircs - Hold home Dilaudid but continue IV Dilaudid, increase to q2hrs - Start end-tidal CO2 while on frequent IV Dilaudid  -Continue Gattex -Continue Pancreas  enzymes  -Continue budesonide -Continue vitamin D, B12  -Continue TPN per pharmacy -Continue insulin correction insulin     Chronic pain syndrome Mood disorder -Continue methadone -Hold home Dilaudid -Continue Duloxetine, bupropion  Hypertension BP elevated -Continue hydralazine, amlodipine -Start PRN labetalol for severe range pressures   Epilepsy No seizrues here -Continue Keppra  GERD -Continue PPI  Anemia of chronic disease Hgb stable, no clinical bleeding            Disposition: Status is: Inpatient  Remains inpatient appropriate because:Persistent severe electrolyte disturbances   Dispo: The patient is from: Home              Anticipated d/c is to: Home              Patient currently is not medically stable to d/c.   Difficult to place patient No       Level of care: Med-Surg       MDM: The below labs and imaging reports were reviewed and summarized above.  Medication management as above.  Frequent dosing of IV opiates.    DVT prophylaxis: enoxaparin (LOVENOX) injection 40 mg Start: 04/07/21 2200  Code Status: FULL Family Communication: Attempted call to daughter, no answer.       Subjective: No fever, confusion, respiratory distress.  No headache.  She has diffuse widespread pain, similar to her baseline, and increased abdominal pain, worse after stooling.  She is still having frequent stools overnight, still feels nauseated without any appetite at all.  No respiratory symptoms.     Objective: Vitals:   04/08/21 0453 04/08/21 0455 04/08/21 0827 04/08/21 1328  BP:  [name] Kitchen)  152/70 (!) 154/89 (!) 169/72  Pulse:  71 70 62  Resp:  18 18 18   Temp:  98.2 F (36.8 C) 98.5 F (36.9 C) 98.4 F (36.9 C)  TempSrc:   Oral Oral  SpO2:  100% 100% 98%  Weight: 62.5 kg     Height:        Intake/Output Summary (Last 24 hours) at 04/08/2021 1343 Last data filed at 04/08/2021 0401 Gross per 24 hour  Intake 1400 ml  Output --  Net  1400 ml   Filed Weights   04/07/21 1221 04/08/21 0453  Weight: 61 kg 62.5 kg    Examination: General appearance: Thin adult female, alert and in no acute distress.  Lying in bed. HEENT: Anicteric, conjunctiva pink, lids and lashes normal. No nasal deformity, discharge, epistaxis.   Skin: Warm and dry.  No jaundice.  No suspicious rashes or lesions. Cardiac: RRR, nl S1-S2, no murmurs appreciated.  Capillary refill is brisk.  JVP normal.  No LE edema.  Radial pulses 2+ and symmetric. Respiratory: Normal respiratory rate and rhythm.  CTAB without rales or wheezes. Abdomen: Abdomen soft.  Diffuse TTP in all quadrants, voluntary guarding noted. No ascites, distension, hepatosplenomegaly.   MSK: No deformities or effusions.  Moderate loss of subcutaneous muscle mass and fat. Neuro: Awake and alert.  EOMI, moves all extremities with normal strength and coordination. Speech fluent.    Psych: Sensorium intact and responding to questions, attention normal. Affect normal.  Judgment and insight appear normal.    Data Reviewed: I have personally reviewed following labs and imaging studies:  CBC: Recent Labs  Lab 04/06/21 1637 04/07/21 1424 04/08/21 0500  WBC 4.2 4.8 3.2*  NEUTROABS 1.9 3.1 1.5*  HGB 10.4* 10.9* 9.8*  HCT 32.6* 33.0* 30.9*  MCV 91.8 91.7 94.2  PLT 213 215 657   Basic Metabolic Panel: Recent Labs  Lab 04/06/21 1637 04/07/21 1424 04/08/21 0500  NA 140 142 137  K 3.8 3.1* 3.8  CL 116* 119* 114*  CO2 20* 17* 18*  GLUCOSE 101* 115* 119*  BUN 20 17 14   CREATININE 0.90 1.02* 1.00  CALCIUM 7.2* 7.3* 6.7*  MG  --  1.4* 1.5*  PHOS  --  2.0* 1.6*   GFR: Estimated Creatinine Clearance: 58.2 mL/min (by C-G formula based on SCr of 1 mg/dL). Liver Function Tests: Recent Labs  Lab 04/06/21 1637 04/07/21 1424 04/08/21 0500  AST 18 26 29   ALT 23 29 34  ALKPHOS 81 88 78  BILITOT 0.4 <0.1* 0.5  PROT 6.4* 6.4* 5.9*  ALBUMIN 2.7* 2.8* 2.6*   Recent Labs  Lab  04/06/21 1637 04/07/21 1424  LIPASE 31 37   No results for input(s): AMMONIA in the last 168 hours. Coagulation Profile: No results for input(s): INR, PROTIME in the last 168 hours. Cardiac Enzymes: No results for input(s): CKTOTAL, CKMB, CKMBINDEX, TROPONINI in the last 168 hours. BNP (last 3 results) No results for input(s): PROBNP in the last 8760 hours. HbA1C: No results for input(s): HGBA1C in the last 72 hours. CBG: No results for input(s): GLUCAP in the last 168 hours. Lipid Profile: Recent Labs    04/08/21 0500  TRIG 121   Thyroid Function Tests: No results for input(s): TSH, T4TOTAL, FREET4, T3FREE, THYROIDAB in the last 72 hours. Anemia Panel: No results for input(s): VITAMINB12, FOLATE, FERRITIN, TIBC, IRON, RETICCTPCT in the last 72 hours. Urine analysis:    Component Value Date/Time   COLORURINE YELLOW 04/06/2021 [place] (  A) 04/06/2021 1637   LABSPEC 1.010 04/06/2021 1637   PHURINE 7.0 04/06/2021 1637   GLUCOSEU NEGATIVE 04/06/2021 1637   HGBUR NEGATIVE 04/06/2021 [place] 04/06/2021 [place] 04/06/2021 1637   PROTEINUR >=300 (A) 04/06/2021 1637   NITRITE NEGATIVE 04/06/2021 1637   LEUKOCYTESUR SMALL (A) 04/06/2021 1637   Sepsis Labs: @LABRCNTIP (procalcitonin:4,lacticacidven:4)  ) Recent Results (from the past 240 hour(s))  Resp Panel by RT-PCR (Flu A&B, Covid) Nasopharyngeal Swab     Status: None   Collection Time: 04/07/21  5:58 PM   Specimen: Nasopharyngeal Swab; Nasopharyngeal(NP) swabs in vial transport medium  Result Value Ref Range Status   SARS Coronavirus 2 by RT PCR NEGATIVE NEGATIVE Final    Comment: (NOTE) SARS-CoV-2 target nucleic acids are NOT DETECTED.  The SARS-CoV-2 RNA is generally detectable in upper respiratory specimens during the acute phase of infection. The lowest concentration of SARS-CoV-2 viral copies this assay can detect is 138 copies/mL. A negative result does not  preclude SARS-Cov-2 infection and should not be used as the sole basis for treatment or other patient management decisions. A negative result may occur with  improper specimen collection/handling, submission of specimen other than nasopharyngeal swab, presence of viral mutation(s) within the areas targeted by this assay, and inadequate number of viral copies(<138 copies/mL). A negative result must be combined with clinical observations, patient history, and epidemiological information. The expected result is Negative.  Fact Sheet for Patients:  EntrepreneurPulse.com.au  Fact Sheet for Healthcare Providers:  IncredibleEmployment.be  This test is no t yet approved or cleared by the [place] FDA and  has been authorized for detection and/or diagnosis of SARS-CoV-2 by FDA under an Emergency Use Authorization (EUA). This EUA will remain  in effect (meaning this test can be used) for the duration of the COVID-19 declaration under Section 564(b)(1) of the Act, 21 U.S.C.section 360bbb-3(b)(1), unless the authorization is terminated  or revoked sooner.       Influenza A by PCR NEGATIVE NEGATIVE Final   Influenza B by PCR NEGATIVE NEGATIVE Final    Comment: (NOTE) The Xpert Xpress SARS-CoV-2/FLU/RSV plus assay is intended as an aid in the diagnosis of influenza from Nasopharyngeal swab specimens and should not be used as a sole basis for treatment. Nasal washings and aspirates are unacceptable for Xpert Xpress SARS-CoV-2/FLU/RSV testing.  Fact Sheet for Patients: EntrepreneurPulse.com.au  Fact Sheet for Healthcare Providers: IncredibleEmployment.be  This test is not yet approved or cleared by the [place] FDA and has been authorized for detection and/or diagnosis of SARS-CoV-2 by FDA under an Emergency Use Authorization (EUA). This EUA will remain in effect (meaning this test can be used) for the  duration of the COVID-19 declaration under Section 564(b)(1) of the Act, 21 U.S.C. section 360bbb-3(b)(1), unless the authorization is terminated or revoked.  Performed at [facility], [place] [address] [place], [place] [address]          Radiology Studies: CT Abdomen Pelvis Wo Contrast  Result Date: 04/07/2021 CLINICAL DATA:  Abdominal pain since April. Nausea and vomiting. Weight loss. On TPN for short gut syndrome. Chronic diarrhea. Crohn disease. EXAM: CT ABDOMEN AND PELVIS WITHOUT CONTRAST TECHNIQUE: Multidetector CT imaging of the abdomen and pelvis was performed following the standard protocol without IV contrast. COMPARISON:  03/10/2021 FINDINGS: Lower chest: Right lower lobe calcified granuloma. Centrilobular emphysema. Normal heart size without pericardial or pleural effusion. Hepatobiliary: Normal noncontrast appearance of the liver. Pneumobilia. Cholecystectomy. Pancreas: Normal noncontrast appearance  of the pancreas. Spleen: Normal in size, without focal abnormality. Adrenals/Urinary Tract: Normal adrenal glands. No renal calculi or hydronephrosis. No bladder calculi. Stomach/Bowel: Normal stomach, without wall thickening. Suspect partial colectomy. The remaining rectosigmoid colon is thick walled and fluid-filled, including on 62/2. Extensive surgical sutures involving the small bowel, which is normal in caliber. Low sensitivity for enteritis secondary to lack of oral or IV contrast. Vascular/Lymphatic: Right femoral venous line terminates in the IVC at the level of the hepatic veins, similar. Normal caliber of the aorta and branch vessels. No abdominopelvic adenopathy. Reproductive: Hysterectomy.  No adnexal mass. Other: No significant free fluid.  Mild pelvic floor laxity. Musculoskeletal: Left femoral head avascular necrosis without collapse. Osteopenia. Presumed bone island in the left iliac wing. Mild convex left lumbar spine curvature. IMPRESSION: 1. Degraded  exam secondary to lack of IV or oral contrast. 2. Distal colitis, which given the clinical history, is most likely due to inflammatory bowel disease. Infection could look similar. 3. Extensive surgical changes of enterotomy and partial colectomy. No bowel obstruction. 4. Left femoral head avascular necrosis. 5.  Emphysema (ICD10-J43.9). Electronically Signed   By: Abigail Miyamoto M.D.   On: 04/07/2021 16:00        Scheduled Meds: . amLODipine  10 mg Oral Daily  . budesonide  3 mg Oral QODAY  . buPROPion  200 mg Oral Daily  . calcium carbonate  500 mg of elemental calcium Oral Daily  . Chlorhexidine Gluconate Cloth  6 each Topical Daily  . cholecalciferol  1,000 Units Oral Daily  . cycloSPORINE  1 drop Both Eyes BID  . DULoxetine  90 mg Oral Daily  . enoxaparin (LOVENOX) injection  40 mg Subcutaneous Q24H  . hydrALAZINE  75 mg Oral TID  . insulin aspart  0-9 Units Subcutaneous Q6H  . levETIRAcetam  500 mg Oral BID  . methadone  5 mg Oral 5 X Daily  . metoprolol tartrate  100 mg Oral BID  . multivitamin with minerals   Oral Daily  . pantoprazole  40 mg Oral Daily  . polyvinyl alcohol  1 drop Both Eyes QID  . sodium chloride flush  10-40 mL Intracatheter Q12H  . sodium chloride flush  3 mL Intravenous Q12H  . vitamin B-12  1,000 mcg Oral Daily   Continuous Infusions: . sodium chloride 100 mL/hr at 04/08/21 1104  . potassium PHOSPHATE IVPB (in mmol) 20 mmol (04/08/21 1226)  . TPN CYCLIC-ADULT (ION)       LOS: 0 days    Time spent: 35 minutes    Edwin Dada, MD [facility] 04/08/2021, 1:43 PM     Please page though [place] or Epic secure chat:  For [employer], Adult nurse

## 2021-04-08 NOTE — Plan of Care (Signed)
  Problem: Education: Goal: Knowledge of General Education information will improve Description: Including pain rating scale, medication(s)/side effects and non-pharmacologic comfort measures Outcome: Progressing   Problem: Clinical Measurements: Goal: Respiratory complications will improve Outcome: Progressing   

## 2021-04-08 NOTE — Progress Notes (Signed)
PHARMACY - TOTAL PARENTERAL NUTRITION CONSULT NOTE   Indication: Short bowel syndrome  Patient Measurements: Height: 5' 7"  (170.2 cm) Weight: 62.5 kg (137 lb 12.6 oz) IBW/kg (Calculated) : 61.6 TPN AdjBW (KG): 61 Body mass index is 21.58 kg/m. Usual Weight:   Assessment: Patient is a 52 yoF on chronic TPN d/t short gut syndrome after multiple bowel resections from Crohn's disease, frequent admissions in the past year for complicated line-associated bacteremia, presents with persistent abd pain with n/v/d and resultant metabolic derangements. Pharmacy to continue TPN while admitted.  Glucose / Insulin: not on insulin - cbgs (goal <150): 119 from cmet Electrolytes: Na 137, K 3.8 (got 4 runs KCL on 5/13), phos 1.6, Mag 1.5, CorrCa 7.82 ; Cl elevated 114, CO2 low 18 Renal: scr 1 (crcl~58) Hepatic: LFTs wnl - TG 121 (5/14) - prealbumin 26.9 Intake / Output; MIVF:  - NS ar 100 ml/hr GI Imaging: - 5/13 abd CT: Distal colitis, which given the clinical history, is most likely due to inflammatory bowel disease. Extensive surgical changes of enterotomy and partial colectomy. No bowel obstruction GI Surgeries / Procedures:   Central access: right femoral PICC TPN start date: on TPN PTA --> continuing inpatient  Nutritional Goals: pending recom from dietician  Cyclic TPN of 3007 ML over 12 hrs provide:  (provides 85 g of protein and 1789 kcals per day with lipid and 1195 kcal w/o lipid)   Home TPN formula: - cyclic TPN infusing over 12 hrs (taper up for 1 hr and down for 1 hr) - Total ordered TPN volume: 1800 mL - Protein: 85 gm - dextrose: 260 gm - lipid: 60 gm (contains lipid on MWF) - Total Kcal: 1824 kcal with lipid and 1224 kcal without lipid) - At home pt hangs new TPN bag at 6p   Current Nutrition:  - TPN  Plan:  Now: - magnesium 2gm IV x1 per MD - Kphos 20 mmol IV x1 per MD  - Start cyclic TPN with 6226 ml infuse over 12 hrs starting at 1800; Will add lipid on MWF  -  Electrolytes in TPN: Na 80 mEq/L, K 11mq/L, Ca  4 mEq/L, Mg 584m/L, and Phos 1530m/L. Cl:Ac --> max Ac - Add standard MVI and trace elements to TPN - Initiate Sensitive q6h SSI and adjust as needed (CBGs: 2 hrs past cyclic TPN start + 1 hr past cycle TPN d/c'ed + middle of TPN infusion + while off TPN --> 0000, 0700, 1200, 2000) - IVF NS @ 100 ml/hr per MD - Monitor TPN labs on Mon/Thurs - bmet, phos, mag on 5/15  Caitlin Peters P 04/08/2021,8:44 AM

## 2021-04-09 DIAGNOSIS — K5080 Crohn's disease of both small and large intestine without complications: Secondary | ICD-10-CM | POA: Diagnosis not present

## 2021-04-09 DIAGNOSIS — E876 Hypokalemia: Secondary | ICD-10-CM | POA: Diagnosis not present

## 2021-04-09 DIAGNOSIS — K529 Noninfective gastroenteritis and colitis, unspecified: Secondary | ICD-10-CM | POA: Diagnosis not present

## 2021-04-09 LAB — GASTROINTESTINAL PANEL BY PCR, STOOL (REPLACES STOOL CULTURE)

## 2021-04-09 LAB — GLUCOSE, CAPILLARY
Glucose-Capillary: 104 mg/dL — ABNORMAL HIGH (ref 70–99)
Glucose-Capillary: 106 mg/dL — ABNORMAL HIGH (ref 70–99)
Glucose-Capillary: 121 mg/dL — ABNORMAL HIGH (ref 70–99)
Glucose-Capillary: 125 mg/dL — ABNORMAL HIGH (ref 70–99)
Glucose-Capillary: 128 mg/dL — ABNORMAL HIGH (ref 70–99)
Glucose-Capillary: 139 mg/dL — ABNORMAL HIGH (ref 70–99)

## 2021-04-09 LAB — RENAL FUNCTION PANEL
Albumin: 2.3 g/dL — ABNORMAL LOW (ref 3.5–5.0)
Anion gap: 2 — ABNORMAL LOW (ref 5–15)
BUN: 19 mg/dL (ref 6–20)
CO2: 24 mmol/L (ref 22–32)
Calcium: 7 mg/dL — ABNORMAL LOW (ref 8.9–10.3)
Chloride: 113 mmol/L — ABNORMAL HIGH (ref 98–111)
Creatinine, Ser: 0.8 mg/dL (ref 0.44–1.00)
GFR, Estimated: >60 mL/min (ref >60)
Glucose, Bld: 127 mg/dL — ABNORMAL HIGH (ref 70–99)
Phosphorus: 2.9 mg/dL (ref 2.5–4.6)
Potassium: 4.1 mmol/L (ref 3.5–5.1)
Sodium: 139 mmol/L (ref 135–145)

## 2021-04-09 LAB — MAGNESIUM: Magnesium: 1.8 mg/dL (ref 1.7–2.4)

## 2021-04-09 MED ORDER — MAGNESIUM SULFATE IN D5W 1-5 GM/100ML-% IV SOLN
1.0000 g | Freq: Once | INTRAVENOUS | Status: AC
  Administered 2021-04-09: 1 g via INTRAVENOUS
  Filled 2021-04-09: qty 100

## 2021-04-09 MED ORDER — LABETALOL HCL 5 MG/ML IV SOLN: 5.0000 mg | Freq: Four times a day (QID) | INTRAVENOUS | Status: DC | PRN

## 2021-04-09 MED ORDER — TRAVASOL 10 % IV SOLN
INTRAVENOUS | Status: AC
  Filled 2021-04-09: qty 846

## 2021-04-09 MED ORDER — CALCIUM GLUCONATE-NACL 1-0.675 GM/50ML-% IV SOLN
1.0000 g | Freq: Once | INTRAVENOUS | Status: AC
  Administered 2021-04-09: 1000 mg via INTRAVENOUS
  Filled 2021-04-09: qty 50

## 2021-04-09 NOTE — Progress Notes (Signed)
Initial Nutrition Assessment  RD working remotely.  DOCUMENTATION CODES:   Not applicable  INTERVENTION:  - TPN regimen per Pharmacist. - will complete NFPE when feasible. - recommend resume home regimen of Gattex.    NUTRITION DIAGNOSIS:   Inadequate oral intake related to altered GI function (short bowel syndrome) as evidenced by other (comment) (need for TPN).  GOAL:   Patient will meet greater than or equal to 90% of their needs  MONITOR:   PO intake,Labs,Weight trends,I & O's,Other (Comment) (TPN regimen)  REASON FOR ASSESSMENT:   Consult New TPN/TNA  ASSESSMENT:   60 y.o. female with medical history of complicated Crohn's disease, repeat bowel resection followed by [facility], now with short bowel syndrome, TPN dependent, output managed with Gattex, chronic diffuse pain on methadone and PO Dilaudid, and moderate protein calorie malnutrition. She presented to the ED d/t poor appetite, N/V, foul-smelling stools, and severe abdominal pain. In the ED she was found to have hypokalemia, hypophosphatemia, hypomagnesemia, and acidosis.  Diet advanced from FLD to Regular today at 0740. Documented meal intakes have been 100% of breakfast and 50% of lunch yesterday on FLD, 0% of breakfast and lunch today.   She has double lumen PICC in R femoral which was placed on 04/03/21.   Patient on TPN at home and is currently ordered home regimen of 1800 ml x12 hours/day. This regimen with lipids provides 1824 kcal (without provides 1224 kcal) and 85 grams protein/day.  Weight today is 139 lb, weight yesterday was 138 lb, and weight on 5/13 was 134 lb. These weights are consistent with weights over the past 1.5 months.   No information documented in the edema section of flow sheet.   Review of I/O flow sheet indicates patient is +3.8L since admission. Will continue to monitor closely for accuracy of this and need for adjustment in fluid requirement/day.      Labs reviewed; CBGs: 125, 106,  121, 139 mg/dl, Cl: 113 mmol/l, Ca: 7 mg/dl. Medications reviewed; 500 mg oscal/day, 1000 units cholecalciferol/day, sliding sale novolog, 36000 units creon PTN, PRN lomotil and imodium, 1 g IV Mg sulfate x1 run 5/15, 1 tablet multivitamin with minerals/day, 40 mg oral protonix/day, 20 mmol IV KPhos x1 run 5/14, 1000 mcg oral cyanocobalamin/day    NUTRITION - FOCUSED PHYSICAL EXAM:  unable to complete at this time.   Diet Order:   Diet Order            Diet regular Room service appropriate? Yes; Fluid consistency: Thin  Diet effective now                 EDUCATION NEEDS:   No education needs have been identified at this time  Skin:  Skin Assessment: Reviewed RN Assessment  Last BM:  5/14 (type 6)  Height:   Ht Readings from Last 1 Encounters:  04/07/21 5' 7"  (1.702 m)    Weight:   Wt Readings from Last 1 Encounters:  04/09/21 63.1 kg    Estimated Nutritional Needs:  Kcal:  1800-2000 kcal Protein:  90-100 grams Fluid:  >/= 1.8 L/day       Jarome Matin, MS, RD, LDN, CNSC Inpatient Clinical Dietitian RD pager # available in [facility]  After hours/weekend pager # available in [facility]

## 2021-04-09 NOTE — Progress Notes (Signed)
PHARMACY - TOTAL PARENTERAL NUTRITION CONSULT NOTE   Indication: Short bowel syndrome  Patient Measurements: Height: 5' 7"  (170.2 cm) Weight: 63.1 kg (139 lb 1.8 oz) IBW/kg (Calculated) : 61.6 TPN AdjBW (KG): 61 Body mass index is 21.79 kg/m. Usual Weight:   Assessment: Patient is a 46 yoF on chronic TPN d/t short gut syndrome after multiple bowel resections from Crohn's disease, frequent admissions in the past year for complicated line-associated bacteremia, presents with persistent abd pain with n/v/d and resultant metabolic derangements. Pharmacy to continue TPN while admitted.  Glucose / Insulin: sSSI q6h - cbgs (goal <150): wnl Electrolytes: Mag 1.8 (s/p 2gm mag sulfate on 5/14), CorrCa 7.56, Phos up 2.9 (s/p 20 mmol kphos IV x1 on 5/14) ; Cl elevated 113, CO2 improved to 24 - on oscal 59m daily Renal: scr <1 (crcl~73) Hepatic: LFTs wnl - TG 121 (5/14) - prealbumin 26.9 Intake / Output; MIVF:  - I/O: +1940 ml GI Imaging: - 5/13 abd CT: Distal colitis, which given the clinical history, is most likely due to inflammatory bowel disease. Extensive surgical changes of enterotomy and partial colectomy. No bowel obstruction GI Surgeries / Procedures:   Central access: right femoral PICC TPN start date: on TPN PTA --> continuing inpatient  Nutritional Goals: pending recom from dietician  Cyclic TPN of 14010mL over 12 hrs provide:  (provides 85 g of protein and 1789 kcals per day with lipid and 1195 kcal w/o lipid)   Home TPN formula: - cyclic TPN infusing over 12 hrs (taper up for 1 hr and down for 1 hr) - Total ordered TPN volume: 1800 mL - Protein: 85 gm - dextrose: 260 gm - lipid: 60 gm (contains lipid on MWF) - Total Kcal: 1824 kcal with lipid and 1224 kcal without lipid) - At home pt hangs new TPN bag at 6p   Current Nutrition:  - TPN  Plan:   Now: - magnesium 1 gm IV x1 per MD - calcium gluconate 1gm IV x1  - Continue cyclic TPN with 127258ml infuse over 12  hrs starting at 1800; Will add lipid on MWF  - Electrolytes in TPN: Na 80 mEq/L, K 534m/L, increase Ca to 5 mEq/L, Mg 58m44mL, and Phos 158m8mL. Cl:Ac --> max Ac - Add standard MVI and trace elements to TPN - Initiate Sensitive q6h SSI and adjust as needed (CBGs: 2 hrs past cyclic TPN start + 1 hr past cycle TPN d/c'ed + middle of TPN infusion + while off TPN --> 0000, 0700, 1200, 2000) - Monitor TPN labs on Mon/Thurs  Neelah Mannings P 04/09/2021,9:33 AM

## 2021-04-09 NOTE — Progress Notes (Signed)
[facility] PROGRESS NOTE    JERZEY KOMPERDA  KZL:[id number] DOB: 02/01/1961 DOA: 04/07/2021 PCP: [name], [name], MD      Brief Narrative:  Mrs. Rotter is a 60 y.o. F with complicated Crohn's disease, repeat bowel resec, on Budesonide followed by [facility] GI Dr. Domenica Fail, now with short gut syndrome, TPN dependent, output managed with Gattex, chronic diffuse pain on methadone and PO Dilaudid, and moderate protein calorie malnutrition who presented with anorexia, vomiting/nausea, foul stools and severe abdominal pain.  In the ER, found to have hypokalemia, hypophosphatemia, hypomagnesemia and acidosis.         Assessment & Plan:  Hypokalemia, hypophosphatemia, hypomagnesemia, non-anion gap metabolic acidosis due to diarrhea Resolved  Nausea, vomiting and diarrhea  Resolved -Stop IV fluids  Abdominal pain Still severe.  Doubt Crohn's flare.  GI panel pending.   -Continue IV Dilaudid q2h - Start end-tidal CO2 while on frequent IV Dilaudid  Crohn's disease Short gut syndrome  -Continue Gattex -Contineu panc enzymes  -Contineu budesonide -Continue vitamin D, B12  -Continue TPN per pharmacy -Continue insulin correction insulin     Chronic pain syndrome Mood disorder -Continue methadone -Hold home Dilaudid -Continue Duloxetine, bupropion  Hypertension BP elevated -Continue hydralazine, amlodipine -Continue PRN labetalol for severe range pressures   Epilepsy No seizures here -Continue Keppra  GERD -Continue PPI  Anemia of chronic disease Hgb stable, no clinical bleeding            Disposition: Status is: Inpatient  Remains inpatient appropriate because:Ongoing active pain requiring inpatient pain management   Dispo: The patient is from: Home              Anticipated d/c is to: Home              Patient currently is not medically stable to d/c.   Difficult to place patient No       Level of care:  Med-Surg       MDM: The below labs and imaging reports were reviewed and summarized above.  Medication management as above.  Frequent dosing of IV opiates.    DVT prophylaxis: Place and maintain sequential compression device Start: 04/09/21 0917  Code Status: FULL Family Communication: Called to daughter, no answer       Subjective: No fever, confusion, respiratory distress. Bowel movements have slowed down, abdominal pain is still severe requiring frequent IV Dilaudid, she has not vomited recently.  No hematochezia.      Objective: Vitals:   04/08/21 2040 04/09/21 0523 04/09/21 1106 04/09/21 1336  BP: (!) 169/90 (!) 155/88 (!) 163/90 (!) 141/83  Pulse: 64 73 67 68  Resp: 20 20 15 17   Temp: 98.4 F (36.9 C) 98.7 F (37.1 C) (!) 97.5 F (36.4 C) 98.1 F (36.7 C)  TempSrc: Oral  Oral   SpO2: 96% 100% 100% 100%  Weight:  63.1 kg    Height:        Intake/Output Summary (Last 24 hours) at 04/09/2021 1448 Last data filed at 04/09/2021 1300 Gross per 24 hour  Intake 480 ml  Output --  Net 480 ml   Filed Weights   04/07/21 1221 04/08/21 0453 04/09/21 0523  Weight: 61 kg 62.5 kg 63.1 kg    Examination: General appearance: Thin adult female, sitting up in bed, watching television     HEENT: Anicteric, conjunctive are pink, lids and lashes normal.  No nasal deformity, discharge, or epistaxis, oropharynx moist, no oral lesions, hearing normal Skin:  No suspicious rashes or lesions on the face, neck, arms, or legs. Cardiac: RRR, no murmurs, no lower extremity edema Respiratory: Normal respiratory rate and rhythm, lungs clear without rales or wheezes Abdomen: Abdomen soft without tenderness palpation or guarding MSK:  Neuro:    Psych: Attention normal, affect normal, judgment insight appear normal     Data Reviewed: I have personally reviewed following labs and imaging studies:  CBC: Recent Labs  Lab 04/06/21 1637 04/07/21 1424 04/08/21 0500  WBC 4.2 4.8  3.2*  NEUTROABS 1.9 3.1 1.5*  HGB 10.4* 10.9* 9.8*  HCT 32.6* 33.0* 30.9*  MCV 91.8 91.7 94.2  PLT 213 215 415   Basic Metabolic Panel: Recent Labs  Lab 04/06/21 1637 04/07/21 1424 04/08/21 0500 04/09/21 0345  NA 140 142 137 139  K 3.8 3.1* 3.8 4.1  CL 116* 119* 114* 113*  CO2 20* 17* 18* 24  GLUCOSE 101* 115* 119* 127*  BUN 20 17 14 19   CREATININE 0.90 1.02* 1.00 0.80  CALCIUM 7.2* 7.3* 6.7* 7.0*  MG  --  1.4* 1.5* 1.8  PHOS  --  2.0* 1.6* 2.9   GFR: Estimated Creatinine Clearance: 72.7 mL/min (by C-G formula based on SCr of 0.8 mg/dL). Liver Function Tests: Recent Labs  Lab 04/06/21 1637 04/07/21 1424 04/08/21 0500 04/09/21 0345  AST 18 26 29   --   ALT 23 29 34  --   ALKPHOS 81 88 78  --   BILITOT 0.4 <0.1* 0.5  --   PROT 6.4* 6.4* 5.9*  --   ALBUMIN 2.7* 2.8* 2.6* 2.3*   Recent Labs  Lab 04/06/21 1637 04/07/21 1424  LIPASE 31 37   No results for input(s): AMMONIA in the last 168 hours. Coagulation Profile: No results for input(s): INR, PROTIME in the last 168 hours. Cardiac Enzymes: No results for input(s): CKTOTAL, CKMB, CKMBINDEX, TROPONINI in the last 168 hours. BNP (last 3 results) No results for input(s): PROBNP in the last 8760 hours. HbA1C: No results for input(s): HGBA1C in the last 72 hours. CBG: Recent Labs  Lab 04/08/21 2042 04/09/21 0007 04/09/21 0650 04/09/21 0739 04/09/21 1148  GLUCAP 144* 125* 106* 121* 139*   Lipid Profile: Recent Labs    04/08/21 0500  TRIG 121   Thyroid Function Tests: No results for input(s): TSH, T4TOTAL, FREET4, T3FREE, THYROIDAB in the last 72 hours. Anemia Panel: No results for input(s): VITAMINB12, FOLATE, FERRITIN, TIBC, IRON, RETICCTPCT in the last 72 hours. Urine analysis:    Component Value Date/Time   COLORURINE YELLOW 04/06/2021 1637   APPEARANCEUR HAZY (A) 04/06/2021 1637   LABSPEC 1.010 04/06/2021 1637   PHURINE 7.0 04/06/2021 1637   GLUCOSEU NEGATIVE 04/06/2021 1637   HGBUR  NEGATIVE 04/06/2021 [place] 04/06/2021 [place] 04/06/2021 1637   PROTEINUR >=300 (A) 04/06/2021 1637   NITRITE NEGATIVE 04/06/2021 1637   LEUKOCYTESUR SMALL (A) 04/06/2021 1637   Sepsis Labs: @LABRCNTIP (procalcitonin:4,lacticacidven:4)  ) Recent Results (from the past 240 hour(s))  Resp Panel by RT-PCR (Flu A&B, Covid) Nasopharyngeal Swab     Status: None   Collection Time: 04/07/21  5:58 PM   Specimen: Nasopharyngeal Swab; Nasopharyngeal(NP) swabs in vial transport medium  Result Value Ref Range Status   SARS Coronavirus 2 by RT PCR NEGATIVE NEGATIVE Final    Comment: (NOTE) SARS-CoV-2 target nucleic acids are NOT DETECTED.  The SARS-CoV-2 RNA is generally detectable in upper respiratory specimens during the acute phase of infection. The lowest concentration of  SARS-CoV-2 viral copies this assay can detect is 138 copies/mL. A negative result does not preclude SARS-Cov-2 infection and should not be used as the sole basis for treatment or other patient management decisions. A negative result may occur with  improper specimen collection/handling, submission of specimen other than nasopharyngeal swab, presence of viral mutation(s) within the areas targeted by this assay, and inadequate number of viral copies(<138 copies/mL). A negative result must be combined with clinical observations, patient history, and epidemiological information. The expected result is Negative.  Fact Sheet for Patients:  EntrepreneurPulse.com.au  Fact Sheet for Healthcare Providers:  IncredibleEmployment.be  This test is no t yet approved or cleared by the [place] FDA and  has been authorized for detection and/or diagnosis of SARS-CoV-2 by FDA under an Emergency Use Authorization (EUA). This EUA will remain  in effect (meaning this test can be used) for the duration of the COVID-19 declaration under Section 564(b)(1) of the  Act, 21 U.S.C.section 360bbb-3(b)(1), unless the authorization is terminated  or revoked sooner.       Influenza A by PCR NEGATIVE NEGATIVE Final   Influenza B by PCR NEGATIVE NEGATIVE Final    Comment: (NOTE) The Xpert Xpress SARS-CoV-2/FLU/RSV plus assay is intended as an aid in the diagnosis of influenza from Nasopharyngeal swab specimens and should not be used as a sole basis for treatment. Nasal washings and aspirates are unacceptable for Xpert Xpress SARS-CoV-2/FLU/RSV testing.  Fact Sheet for Patients: EntrepreneurPulse.com.au  Fact Sheet for Healthcare Providers: IncredibleEmployment.be  This test is not yet approved or cleared by the [place] FDA and has been authorized for detection and/or diagnosis of SARS-CoV-2 by FDA under an Emergency Use Authorization (EUA). This EUA will remain in effect (meaning this test can be used) for the duration of the COVID-19 declaration under Section 564(b)(1) of the Act, 21 U.S.C. section 360bbb-3(b)(1), unless the authorization is terminated or revoked.  Performed at [facility], [place] [address] [place], [place] [address]   Gastrointestinal Panel by PCR , Stool     Status: None   Collection Time: 04/08/21  2:00 AM   Specimen: Stool  Result Value Ref Range Status   Campylobacter species NOT DETECTED NOT DETECTED Final   Plesimonas shigelloides NOT DETECTED NOT DETECTED Final   Salmonella species NOT DETECTED NOT DETECTED Final   Yersinia enterocolitica NOT DETECTED NOT DETECTED Final   Vibrio species NOT DETECTED NOT DETECTED Final   Vibrio cholerae NOT DETECTED NOT DETECTED Final   Enteroaggregative E coli (EAEC) NOT DETECTED NOT DETECTED Final   Enteropathogenic E coli (EPEC) NOT DETECTED NOT DETECTED Final   Enterotoxigenic E coli (ETEC) NOT DETECTED NOT DETECTED Final   Shiga like toxin producing E coli (STEC) NOT DETECTED NOT DETECTED Final    Shigella/Enteroinvasive E coli (EIEC) NOT DETECTED NOT DETECTED Final   Cryptosporidium NOT DETECTED NOT DETECTED Final   Cyclospora cayetanensis NOT DETECTED NOT DETECTED Final   Entamoeba histolytica NOT DETECTED NOT DETECTED Final   Giardia lamblia NOT DETECTED NOT DETECTED Final   Adenovirus F40/41 NOT DETECTED NOT DETECTED Final   Astrovirus NOT DETECTED NOT DETECTED Final   Norovirus GI/GII NOT DETECTED NOT DETECTED Final   Rotavirus A NOT DETECTED NOT DETECTED Final   Sapovirus (I, II, IV, and V) NOT DETECTED NOT DETECTED Final    Comment: Performed at [facility], [address], [place], [place] [address]         Radiology Studies: CT Abdomen Pelvis Wo Contrast  Result Date: 04/07/2021 CLINICAL DATA:  Abdominal pain since April. Nausea and vomiting. Weight loss. On TPN for short gut syndrome. Chronic diarrhea. Crohn disease. EXAM: CT ABDOMEN AND PELVIS WITHOUT CONTRAST TECHNIQUE: Multidetector CT imaging of the abdomen and pelvis was performed following the standard protocol without IV contrast. COMPARISON:  03/10/2021 FINDINGS: Lower chest: Right lower lobe calcified granuloma. Centrilobular emphysema. Normal heart size without pericardial or pleural effusion. Hepatobiliary: Normal noncontrast appearance of the liver. Pneumobilia. Cholecystectomy. Pancreas: Normal noncontrast appearance of the pancreas. Spleen: Normal in size, without focal abnormality. Adrenals/Urinary Tract: Normal adrenal glands. No renal calculi or hydronephrosis. No bladder calculi. Stomach/Bowel: Normal stomach, without wall thickening. Suspect partial colectomy. The remaining rectosigmoid colon is thick walled and fluid-filled, including on 62/2. Extensive surgical sutures involving the small bowel, which is normal in caliber. Low sensitivity for enteritis secondary to lack of oral or IV contrast. Vascular/Lymphatic: Right femoral venous line terminates in the IVC at the level of the hepatic veins,  similar. Normal caliber of the aorta and branch vessels. No abdominopelvic adenopathy. Reproductive: Hysterectomy.  No adnexal mass. Other: No significant free fluid.  Mild pelvic floor laxity. Musculoskeletal: Left femoral head avascular necrosis without collapse. Osteopenia. Presumed bone island in the left iliac wing. Mild convex left lumbar spine curvature. IMPRESSION: 1. Degraded exam secondary to lack of IV or oral contrast. 2. Distal colitis, which given the clinical history, is most likely due to inflammatory bowel disease. Infection could look similar. 3. Extensive surgical changes of enterotomy and partial colectomy. No bowel obstruction. 4. Left femoral head avascular necrosis. 5.  Emphysema (ICD10-J43.9). Electronically Signed   By: Abigail Miyamoto M.D.   On: 04/07/2021 16:00        Scheduled Meds: . amLODipine  10 mg Oral Daily  . budesonide  3 mg Oral QODAY  . buPROPion  200 mg Oral Daily  . calcium carbonate  500 mg of elemental calcium Oral Daily  . Chlorhexidine Gluconate Cloth  6 each Topical Daily  . cholecalciferol  1,000 Units Oral Daily  . cycloSPORINE  1 drop Both Eyes BID  . DULoxetine  90 mg Oral Daily  . hydrALAZINE  75 mg Oral TID  . insulin aspart  0-9 Units Subcutaneous Q6H  . methadone  5 mg Oral 5 X Daily  . metoprolol tartrate  100 mg Oral BID  . multivitamin with minerals   Oral Daily  . pantoprazole  40 mg Oral Daily  . polyvinyl alcohol  1 drop Both Eyes QID  . sodium chloride flush  10-40 mL Intracatheter Q12H  . sodium chloride flush  3 mL Intravenous Q12H  . vitamin B-12  1,000 mcg Oral Daily   Continuous Infusions: . levETIRAcetam 500 mg (04/09/21 1237)  . TPN CYCLIC-ADULT (ION)       LOS: 1 day    Time spent: 35 minutes    Edwin Dada, MD [facility] 04/09/2021, 2:48 PM     Please page though [address] or Epic secure chat:  For [employer], Adult nurse

## 2021-04-10 DIAGNOSIS — K5080 Crohn's disease of both small and large intestine without complications: Secondary | ICD-10-CM | POA: Diagnosis not present

## 2021-04-10 DIAGNOSIS — K529 Noninfective gastroenteritis and colitis, unspecified: Secondary | ICD-10-CM | POA: Diagnosis not present

## 2021-04-10 DIAGNOSIS — F1120 Opioid dependence, uncomplicated: Secondary | ICD-10-CM | POA: Diagnosis not present

## 2021-04-10 DIAGNOSIS — E876 Hypokalemia: Secondary | ICD-10-CM | POA: Diagnosis not present

## 2021-04-10 DIAGNOSIS — K50819 Crohn's disease of both small and large intestine with unspecified complications: Secondary | ICD-10-CM | POA: Diagnosis not present

## 2021-04-10 DIAGNOSIS — I10 Essential (primary) hypertension: Secondary | ICD-10-CM

## 2021-04-10 DIAGNOSIS — G894 Chronic pain syndrome: Secondary | ICD-10-CM | POA: Diagnosis not present

## 2021-04-10 LAB — CBC
HCT: 32.7 % — ABNORMAL LOW (ref 36.0–46.0)
Hemoglobin: 10.7 g/dL — ABNORMAL LOW (ref 12.0–15.0)
MCH: 30.1 pg (ref 26.0–34.0)
MCHC: 32.7 g/dL (ref 30.0–36.0)
MCV: 91.9 fL (ref 80.0–100.0)
Platelets: 210 10*3/uL (ref 150–400)
RBC: 3.56 MIL/uL — ABNORMAL LOW (ref 3.87–5.11)
RDW: 14.9 % (ref 11.5–15.5)
WBC: 4.9 10*3/uL (ref 4.0–10.5)
nRBC: 0 % (ref 0.0–0.2)

## 2021-04-10 LAB — DIFFERENTIAL
Abs Immature Granulocytes: 0.01 10*3/uL (ref 0.00–0.07)
Basophils Absolute: 0 10*3/uL (ref 0.0–0.1)
Basophils Relative: 0 %
Eosinophils Absolute: 0.1 10*3/uL (ref 0.0–0.5)
Eosinophils Relative: 3 %
Immature Granulocytes: 0 %
Lymphocytes Relative: 42 %
Lymphs Abs: 2.1 10*3/uL (ref 0.7–4.0)
Monocytes Absolute: 0.3 10*3/uL (ref 0.1–1.0)
Monocytes Relative: 7 %
Neutro Abs: 2.4 10*3/uL (ref 1.7–7.7)
Neutrophils Relative %: 48 %

## 2021-04-10 LAB — PREALBUMIN: Prealbumin: 28.6 mg/dL (ref 18–38)

## 2021-04-10 LAB — MAGNESIUM: Magnesium: 2 mg/dL (ref 1.7–2.4)

## 2021-04-10 LAB — GLUCOSE, CAPILLARY
Glucose-Capillary: 102 mg/dL — ABNORMAL HIGH (ref 70–99)
Glucose-Capillary: 110 mg/dL — ABNORMAL HIGH (ref 70–99)
Glucose-Capillary: 110 mg/dL — ABNORMAL HIGH (ref 70–99)
Glucose-Capillary: 113 mg/dL — ABNORMAL HIGH (ref 70–99)
Glucose-Capillary: 140 mg/dL — ABNORMAL HIGH (ref 70–99)

## 2021-04-10 LAB — COMPREHENSIVE METABOLIC PANEL
ALT: 43 U/L (ref 0–44)
AST: 27 U/L (ref 15–41)
Albumin: 2.6 g/dL — ABNORMAL LOW (ref 3.5–5.0)
Alkaline Phosphatase: 85 U/L (ref 38–126)
Anion gap: 7 (ref 5–15)
BUN: 32 mg/dL — ABNORMAL HIGH (ref 6–20)
CO2: 24 mmol/L (ref 22–32)
Calcium: 8.4 mg/dL — ABNORMAL LOW (ref 8.9–10.3)
Chloride: 106 mmol/L (ref 98–111)
Creatinine, Ser: 0.89 mg/dL (ref 0.44–1.00)
GFR, Estimated: >60 mL/min (ref >60)
Glucose, Bld: 115 mg/dL — ABNORMAL HIGH (ref 70–99)
Potassium: 4.2 mmol/L (ref 3.5–5.1)
Sodium: 137 mmol/L (ref 135–145)
Total Bilirubin: 0.3 mg/dL (ref 0.3–1.2)
Total Protein: 6.1 g/dL — ABNORMAL LOW (ref 6.5–8.1)

## 2021-04-10 LAB — PHOSPHORUS: Phosphorus: 3.6 mg/dL (ref 2.5–4.6)

## 2021-04-10 LAB — TRIGLYCERIDES: Triglycerides: 44 mg/dL (ref <150)

## 2021-04-10 MED ORDER — TEDUGLUTIDE (RDNA) 5 MG ~~LOC~~ KIT
1.5000 mg | PACK | Freq: Every day | SUBCUTANEOUS | Status: DC
  Administered 2021-04-10 – 2021-04-15 (×6): 1.5 mg via SUBCUTANEOUS
  Filled 2021-04-10 (×2): qty 1

## 2021-04-10 MED ORDER — NON FORMULARY: 0.0500 mg/kg | Freq: Every day | Status: DC

## 2021-04-10 MED ORDER — DIPHENOXYLATE-ATROPINE 2.5-0.025 MG PO TABS
1.0000 | ORAL_TABLET | Freq: Two times a day (BID) | ORAL | Status: DC
  Administered 2021-04-10 – 2021-04-12 (×4): 1 via ORAL
  Filled 2021-04-10 (×5): qty 1

## 2021-04-10 MED ORDER — TRAVASOL 10 % IV SOLN
INTRAVENOUS | Status: AC
  Filled 2021-04-10: qty 846

## 2021-04-10 MED ORDER — DIPHENOXYLATE-ATROPINE 2.5-0.025 MG PO TABS
1.0000 | ORAL_TABLET | Freq: Four times a day (QID) | ORAL | Status: DC
  Administered 2021-04-10: 1 via ORAL

## 2021-04-10 MED ORDER — DIPHENHYDRAMINE-ZINC ACETATE 2-0.1 % EX CREA
TOPICAL_CREAM | Freq: Two times a day (BID) | CUTANEOUS | Status: DC | PRN
  Filled 2021-04-10: qty 28

## 2021-04-10 NOTE — Progress Notes (Signed)
PHARMACY - TOTAL PARENTERAL NUTRITION CONSULT NOTE   Indication: Short bowel syndrome  Patient Measurements: Height: 5' 7"  (170.2 cm) Weight: 63.5 kg (139 lb 15.9 oz) IBW/kg (Calculated) : 61.6 TPN AdjBW (KG): 61 Body mass index is 21.93 kg/m. Usual Weight:   Assessment: Patient is a 89 yoF on chronic TPN d/t short gut syndrome after multiple bowel resections from Crohn's disease, frequent admissions in the past year for complicated line-associated bacteremia, presents with persistent abd pain with n/v/d and resultant metabolic derangements. Pharmacy to continue TPN while admitted.  Glucose / Insulin: cbgs (goal <150): wnl - sSSI q6h:  2 units / 24 hrs Electrolytes: Na, K, Mag, Phos, CorrCa 9.5, all WNL.  Cl improved/decreased to 106, CO2 stable 24 - on oscal 581m daily Renal: scr <1 (crcl~73), BUN inc to 32 Hepatic: LFTs, Tbili wnl - TG 121 (5/14) Prealbumin 26.9 (5/14), inc to 28.6 (5/16) Intake / Output; MIVF: net I/O inaccurate.  Urine x3, Stool x8 occurrences.   GI Imaging: - 5/13 abd CT: Distal colitis, which given the clinical history, is most likely due to inflammatory bowel disease. Extensive surgical changes of enterotomy and partial colectomy. No bowel obstruction GI Surgeries / Procedures:   Central access: right femoral PICC TPN start date: on TPN PTA --> continuing inpatient  Nutritional Goals: (per Dietitian on 5/15)  Goals:  Kcal:  1800-2000 kcal, Protein:  90-100 grams, Fluid:  >/= 1.8 L/day  Cyclic TPN of 116560mL over 12 hrs provide:  (provides 85 g of protein and 1789 kcals per day with lipid and 1195 kcal w/o lipid)  Home TPN formula (daily @ 6pm) - cyclic TPN infusing over 12 hrs (taper up for 1 hr and down for 1 hr) - Total ordered TPN volume: 1800 mL - Protein: 85 gm - dextrose: 260 gm - lipid: 60 gm (contains lipid on MWF) - Total Kcal: 1824 kcal with lipid and 1224 kcal without lipid)   Current Nutrition:  TPN, Regular diet  Plan:  - Continue  cyclic TPN with 13748ml infuse over 12 hrs starting at 1800; add lipids on MWF  - Electrolytes in TPN: Na 80 mEq/L, K 5560m/L, Ca to 5 mEq/L, Mg 60m3mL, and Phos 160m44mL.1 Cl:2 Ac - Add standard MVI and trace elements to TPN - Continue Sensitive q6h SSI and adjust as needed (CBGs: 2 hrs past cyclic TPN start + 1 hr past cycle TPN d/c'ed + middle of TPN infusion + while off TPN --> 0000, 0700, 1200, 2000) - Monitor TPN labs on Mon/Thurs   ChriGretta ArabrmD, BCPS Clinical Pharmacist [facility] main pharmacy [phone]/2022 7:28 AM

## 2021-04-10 NOTE — Consult Note (Signed)
Referring Provider: Dr. Loleta Books [facility]) Primary Care Physician:  [name], [name], MD Primary Gastroenterologist:  Dr. Brayton Layman [facility])  Reason for Consultation:  Colitis  HPI: Caitlin Peters is a 60 y.o. female with past medical history pertinent for Crohn's disease with multiple prior small bowel resections, short bowel syndrome (TPN dependent, also on Gattex), line infections, recent discitis, CKD, seizures, and chronic pain, presenting for consultation of colitis.  Patient is followed by Dr. Domenica Fail at [facility].  She is tapering budesonide (3 mg every other day).  She is also on Gattex, TPN, and Creon.  She was previously on Imodium and Lomotil but has not required them while on Gattex.  She states that she was having 4 bowel movements per day up until 5/13 when she started experiencing nausea, vomiting, and diarrhea.  She also reports abdominal pain, worse in the lower quadrants prior to having a bowel movement.  She reports that stools have been liquid with scant amounts of red blood.  Denies any melena.  She has an extensive Crohn's disease history.  She was diagnosed in 97 at the age of 24 and has been on mesalamine, steroids, and antibiotics, but has not had prior biologic therapy.  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  She has had multiple surgeries, including colonic resection and multiple small bowel resections.  She also had a bowel surgery to lengthen the small bowel.  Most recent colonoscopy 10/2020: Patent end-to-side ileo-colonic anastomosis, characterized by healthy appearing mucosa.  The examined portion of the ileum was normal.  Other recent procedures: - EGD 05/20/2017 - normal EGD, no evidence of Crohn's disease - Colonoscopy 06/09/2018 [facility] [place]) - patent ileocolonic anastomosis, healthy mucosa, normal terminal ileum and colon.    Past Medical History:  Diagnosis Date  . Acute pancreatitis 04/13/2020  . Anasarca 10/2019  . AVN  (avascular necrosis of bone) ([place])   . Cataract   . Choledocholithiasis (sludge) s/p ERCP 10/2019 10/21/2020  . Chronic pain syndrome   . CKD (chronic kidney disease), stage III ([place])   . Crohn disease ([place])   . Crohn's disease of small & large intestine with SGS 1984   Caitlin Peters is a 60year old female with a history of Crohn's disease diagnosed in 21 (age 60), history of long-term steroid use with osteoporosis, S/P multiple bowel resections ([id number]) complicated by chronic back and abdominal pain, steatorrhea and short bowel syndrome. The patient has been left with ~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 attached to proximal transverse colon through rectum. She has been  . Depression   . Diverticulosis   . GERD (gastroesophageal reflux disease)   . HTN (hypertension)   . IDA (iron deficiency anemia)   . Malnutrition ([place])   . Mass in chest   . Osteoporosis   . Osteoporosis 12/24/2014  . Pancreatitis   . 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; 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; jejunal to transverse colon anastomosis Treated at [place] SB lengthening to 165cm Dr Alene Mires, [facility]  ESTABLISHED AT [facility]  . Vitamin B12 deficiency     Past Surgical History:  Procedure Laterality Date  . ABDOMINAL ADHESION SURGERY  01/22/2018  . APPENDECTOMY  1989  . BILIARY DILATION  11/26/2019   Procedure: BILIARY DILATION;  Surgeon: Jackquline Denmark, MD;  Location: [facility];  Service: Endoscopy;;  . BILIARY DILATION  03/08/2020  Procedure: BILIARY DILATION;  Surgeon: Rush Landmark Telford Nab., MD;  Location: [place];  Service: Gastroenterology;;  . BIOPSY  03/08/2020   Procedure: BIOPSY;  Surgeon: Irving Copas., MD;  Location: [place];  Service: Gastroenterology;;  . CHEST WALL RESECTION     right thoracotomy,resection of chest mass with anterior rib and reconstruction using prosthetic mesh and video  arthroscopy  . CHOLECYSTECTOMY  01/22/2018  . COLONOSCOPY  2019  . ENTEROSTOMY CLOSURE  04/1999  . ERCP N/A 11/26/2019   Procedure: ENDOSCOPIC RETROGRADE CHOLANGIOPANCREATOGRAPHY (ERCP);  Surgeon: Jackquline Denmark, MD;  Location: [facility];  Service: Endoscopy;  Laterality: N/A;  . ERCP N/A 03/08/2020   Procedure: ENDOSCOPIC RETROGRADE CHOLANGIOPANCREATOGRAPHY (ERCP);  Surgeon: Irving Copas., MD;  Location: [place];  Service: Gastroenterology;  Laterality: N/A;  . ESOPHAGOGASTRODUODENOSCOPY N/A 03/08/2020   Procedure: ESOPHAGOGASTRODUODENOSCOPY (EGD);  Surgeon: Irving Copas., MD;  Location: [place];  Service: Gastroenterology;  Laterality: N/A;  . EUS N/A 03/08/2020   Procedure: UPPER ENDOSCOPIC ULTRASOUND (EUS) LINEAR;  Surgeon: Irving Copas., MD;  Location: [place];  Service: Gastroenterology;  Laterality: N/A;  . ILEOCECETOMY  03/1999   ileocolon resection with abdominal stoma  . ILEOSTOMY CLOSURE  2001  . IR FLUORO GUIDE CV LINE LEFT  01/07/2020  . IR FLUORO GUIDE CV LINE LEFT  03/09/2020  . IR FLUORO GUIDE CV LINE LEFT  05/09/2020  . IR FLUORO GUIDE CV LINE LEFT  07/20/2020  . IR FLUORO GUIDE CV LINE RIGHT  08/05/2020  . IR FLUORO GUIDE CV LINE RIGHT  04/03/2021  . IR PTA VENOUS EXCEPT DIALYSIS CIRCUIT  01/07/2020  . IR REMOVAL TUN CV CATH W/O FL  08/05/2020  . IR US GUIDE VASC ACCESS LEFT     x 2 06/17/19 and 09/14/2019  . IR US GUIDE VASC ACCESS RIGHT  08/05/2020  . KNEE SURGERY     right knee   . LAPAROSCOPIC SMALL BOWEL RESECTION  2009   2000-2009.  SB resections for Crohns Disease - now with Short gut  . OMENTECTOMY  01/22/2018  . PARTIAL HYSTERECTOMY  1984   with LSO  . REMOVAL OF STONES  11/26/2019   Procedure: REMOVAL OF STONES;  Surgeon: Jackquline Denmark, MD;  Location: [facility];  Service: Endoscopy;;  . REMOVAL OF STONES  03/08/2020   Procedure: REMOVAL OF STONES;  Surgeon: Irving Copas., MD;  Location: [place];   Service: Gastroenterology;;  . SALPINGOOPHORECTOMY Left 1984  . SALPINGOOPHORECTOMY Right 1990  . SERIAL TRANSVERSE ENTEROPLASTY (STEP) - SMALL BOWEL LENGTHENING  01/22/2018   Dr Alene Mires, [place] length from 120 to 165cm   . SMALL INTESTINE SURGERY  2002  . SMALL INTESTINE SURGERY  2003  . SPHINCTEROTOMY  11/26/2019   Procedure: SPHINCTEROTOMY;  Surgeon: Jackquline Denmark, MD;  Location: [facility];  Service: Endoscopy;;  . TOTAL ABDOMINAL HYSTERECTOMY  1990   with RSO  . UPPER GASTROINTESTINAL ENDOSCOPY      Prior to Admission medications   Medication Sig Start Date End Date Taking? Authorizing Provider  acetaminophen (TYLENOL) 325 MG tablet Take 650 mg by mouth every 6 (six) hours as needed for mild pain.    Yes [provider]  amLODipine (NORVASC) 10 MG tablet Take 10 mg by mouth daily.   Yes [provider]  budesonide (ENTOCORT EC) 3 MG 24 hr capsule Take 3 capsules (9 mg total) by mouth daily. Patient taking differently: Take 3 mg by mouth every other day. 03/06/21  Yes [name], [name], MD  buPROPion [facility] SR) 100 MG 12 hr tablet Take 200 mg by mouth daily. 03/02/21  Yes [provider]  Calcium 200 MG TABS Take 200 mg by mouth daily.   Yes [provider]  cholecalciferol (VITAMIN D3) 25 MCG (1000 UT) tablet Take 1,000 Units by mouth daily.    Yes [provider]  cycloSPORINE (RESTASIS) 0.05 % ophthalmic emulsion Place 1 drop into both eyes 2 (two) times daily.   Yes [provider]  denosumab (PROLIA) 60 MG/ML SOSY injection Inject 60 mg into the skin every 6 (six) months.   Yes [provider]  dexlansoprazole (DEXILANT) 60 MG capsule Take 1 capsule (60 mg total) by mouth daily. 12/28/20  Yes [name], [name], MD  Dextran 70-Hypromellose 0.1-0.3 % SOLN Place 1 drop into both eyes 4 (four) times daily.   Yes [provider]  diphenoxylate-atropine (LOMOTIL) 2.5-0.025 MG  tablet TAKE 1 TABLET BY MOUTH 4 TIMES DAILY AS NEEDED FOR DIARRHEA OR  LOOSE  STOOLS Patient taking differently: Take 1-2 tablets by mouth 4 (four) times daily as needed for diarrhea or loose stools. 12/28/20  Yes [name], [name], MD  DULoxetine (CYMBALTA) 30 MG capsule TAKE 3 CAPSULES BY MOUTH ONCE DAILY Patient taking differently: Take 90 mg by mouth daily. 03/22/21  Yes [name], [name], MD  estradiol (ESTRACE) 2 MG tablet Take 1 tablet (2 mg total) by mouth daily. 12/07/20  Yes [name], [name], MD  famotidine (PEPCID) 20 MG tablet Take 20 mg by mouth daily as needed for heartburn or indigestion.   Yes [provider]  GATTEX 5 MG KIT Inject 5 mLs into the skin daily. 01/03/21  Yes [provider]  hydrALAZINE (APRESOLINE) 50 MG tablet Take 75 mg by mouth 3 (three) times daily.   Yes [provider]  HYDROmorphone (DILAUDID) 4 MG tablet Take 4 mg by mouth 4 (four) times daily as needed for moderate pain.  09/15/20  Yes [provider]  levETIRAcetam (KEPPRA) 500 MG tablet Take 500 mg by mouth 2 (two) times daily.   Yes [provider]  lipase/protease/amylase (CREON) 36000 UNITS CPEP capsule Take 1 capsule (36,000 Units total) by mouth 3 (three) times daily as needed (with meals for digestion). 06/28/20  Yes [name], [name], MD  loperamide (IMODIUM) 2 MG capsule TAKE 1 CAPSULE BY MOUTH AS NEEDED FOR DIARRHEA OR LOOSE STOOLS Patient taking differently: Take 2 mg by mouth daily as needed for diarrhea or loose stools. 01/20/21  Yes [name], [name], MD  methadone (DOLOPHINE) 5 MG tablet Take 1 tablet (5 mg total) by mouth 5 (five) times daily. 06/30/20  Yes Florencia Reasons, MD  metoprolol tartrate (LOPRESSOR) 100 MG tablet Take 1 tablet (100 mg total) by mouth 2 (two) times daily. 02/21/21  Yes [name], [name], MD  Multiple Vitamins-Minerals (MULTIVITAMIN ADULT PO) Take 1 tablet by mouth daily.   Yes [provider]  NARCAN 4 MG/0.1ML LIQD nasal spray kit Place 1 spray into the nose once as needed (overdose). 10/14/20  Yes [provider]  PRESCRIPTION MEDICATION Inject 1 each into the vein daily. Home TPN . [facility] [place] . 1 bag for 12 hours. [phone]   Yes [provider]  Probiotic Product (PROBIOTIC-10 PO) Take 1 capsule by mouth daily.    Yes [provider]  promethazine (PHENERGAN) 25 MG tablet TAKE 1  TABLET BY MOUTH EVERY 6 HOURS AS NEEDED FOR NAUSEA FOR VOMITING Patient taking differently: Take 25 mg by mouth every 6 (six) hours as needed for nausea or vomiting. 03/22/21  Yes [name], [name], MD  sodium chloride 0.9 % infusion Inject 1 mL into the vein daily as needed (flush). 10/16/20  Yes [provider]  sucralfate (CARAFATE) 1 GM/10ML suspension Take 10 mLs (1 g total) by mouth 4 (four) times daily -  with meals and at bedtime. 06/28/20  Yes [name], [name], MD  Trace Minerals Cu-Mn-Se-Zn (TRALEMENT IV) Inject 1 mL into the vein See admin instructions. Used in TPN bag 4 times weekly   Yes [provider]  vitamin B-12 (CYANOCOBALAMIN) 100 MCG tablet Take 1,000 mcg by mouth daily.    Yes [provider]    Scheduled Meds: . amLODipine  10 mg Oral Daily  . budesonide  3 mg Oral QODAY  . buPROPion  200 mg Oral Daily  . calcium carbonate  500 mg of elemental calcium Oral Daily  . Chlorhexidine Gluconate Cloth  6 each Topical Daily  . cholecalciferol  1,000 Units Oral Daily  . cycloSPORINE  1 drop Both Eyes BID  . DULoxetine  90 mg Oral Daily  . hydrALAZINE  75 mg Oral TID  . insulin aspart  0-9 Units Subcutaneous Q6H  . methadone  5 mg Oral 5 X Daily  . metoprolol tartrate  100 mg Oral BID  . multivitamin with minerals   Oral Daily  . pantoprazole  40 mg Oral Daily  . polyvinyl alcohol  1 drop Both Eyes QID  . sodium chloride flush  10-40 mL Intracatheter Q12H  . sodium  chloride flush  3 mL Intravenous Q12H  . vitamin B-12  1,000 mcg Oral Daily   Continuous Infusions: . levETIRAcetam 500 mg (04/10/21 1039)  . TPN CYCLIC-ADULT (ION)     PRN Meds:.acetaminophen **OR** acetaminophen, diphenoxylate-atropine, HYDROmorphone (DILAUDID) injection, labetalol, lipase/protease/amylase, loperamide, ondansetron (ZOFRAN) IV, sodium chloride flush  Allergies as of 04/07/2021 - Review Complete 04/07/2021  Allergen Reaction Noted  . Meperidine Hives 08/14/2011  . Hyoscyamine Hives and Swelling 07/15/2014  . Cefepime Other (See Comments) 11/27/2017  . Gabapentin Other (See Comments) 10/13/2019  . Lyrica [pregabalin] Other (See Comments) 10/13/2019  . Topamax [topiramate] Other (See Comments) 10/13/2019  . Zosyn [piperacillin sod-tazobactam so]  08/03/2020  . Fentanyl Rash 10/12/2019  . Morphine and related Rash 10/12/2019    Family History  Problem Relation Age of Onset  . Breast cancer Sister   . Multiple sclerosis Sister   . Diabetes Sister   . Lupus Sister   . Colon cancer Other   . Crohn's disease Other   . Seizures Mother   . Glaucoma Mother   . CAD Father   . Heart disease Father   . Hypertension Father     Social History   Socioeconomic History  . Marital status: Single    Spouse name: Not on file  . Number of children: Not on file  . Years of education: Not on file  . Highest education level: Not on file  Occupational History  . Occupation: disabled  Tobacco Use  . Smoking status: Former Research scientist (life sciences)  . Smokeless tobacco: Never Used  Vaping Use  . Vaping Use: Never used  Substance and Sexual Activity  . Alcohol use: Not Currently  . Drug use: Never  . Sexual activity: Yes  Other Topics Concern  . Not on file  Social  History Narrative   Right handed   One story apartment   No caffeine   Social Determinants of Health   Financial Resource Strain: Low Risk   . Difficulty of Paying Living Expenses: Not hard at all  Food Insecurity: Not  on file  Transportation Needs: No Transportation Needs  . Lack of Transportation (Medical): No  . Lack of Transportation (Non-Medical): No  Physical Activity: Not on file  Stress: Not on file  Social Connections: Not on file  Intimate Partner Violence: Not on file    Review of Systems: Review of Systems  Constitutional: Negative for chills and fever.  HENT: Negative for hearing loss and tinnitus.   Eyes: Negative for pain and redness.  Respiratory: Negative for cough and shortness of breath.   Cardiovascular: Negative for chest pain and palpitations.  Gastrointestinal: Positive for abdominal pain, blood in stool, diarrhea, nausea and vomiting. Negative for constipation, heartburn and melena.  Genitourinary: Negative for flank pain and hematuria.  Musculoskeletal: Negative for falls and joint pain.  Skin: Negative for itching and rash.  Neurological: Negative for seizures and loss of consciousness.  Endo/Heme/Allergies: Negative for polydipsia. Does not bruise/bleed easily.  Psychiatric/Behavioral: Negative for substance abuse. The patient is not nervous/anxious.      Physical Exam: Vital signs: Vitals:   04/09/21 2001 04/10/21 0539  BP: (!) 155/85 (!) 150/62  Pulse: 70 (!) 42  Resp: 20 15  Temp: 98.4 F (36.9 C) 99 F (37.2 C)  SpO2: 100% 93%   Last BM Date: 04/09/21 Physical Exam Vitals reviewed.  Constitutional:      General: She is not in acute distress.    Appearance: She is underweight. She is ill-appearing (chronically).     Interventions: Nasal cannula in place.  HENT:     Head: Normocephalic and atraumatic.     Nose: Nose normal. No congestion.     Mouth/Throat:     Mouth: Mucous membranes are moist.     Pharynx: Oropharynx is clear.  Eyes:     General: No scleral icterus.    Extraocular Movements: Extraocular movements intact.  Cardiovascular:     Rate and Rhythm: Normal rate and regular rhythm.  Pulmonary:     Effort: Pulmonary effort is normal. No  respiratory distress.  Abdominal:     General: Bowel sounds are normal. There is no distension.     Palpations: Abdomen is soft. There is no mass.     Tenderness: There is abdominal tenderness (moderate, diffuse). There is guarding. There is no rebound.     Hernia: No hernia is present.  Musculoskeletal:        General: No tenderness.     Cervical back: Normal range of motion and neck supple.     Right lower leg: No edema.     Left lower leg: No edema.  Skin:    General: Skin is warm and dry.  Neurological:     General: No focal deficit present.     Mental Status: She is alert and oriented to person, place, and time.  Psychiatric:        Mood and Affect: Mood normal.        Behavior: Behavior normal. Behavior is cooperative.      GI:  Lab Results: Recent Labs    04/07/21 1424 04/08/21 0500 04/10/21 0605  WBC 4.8 3.2* 4.9  HGB 10.9* 9.8* 10.7*  HCT 33.0* 30.9* 32.7*  PLT 215 192 210   BMET Recent Labs    04/08/21  0500 04/09/21 0345 04/10/21 0605  NA 137 139 137  K 3.8 4.1 4.2  CL 114* 113* 106  CO2 18* 24 24  GLUCOSE 119* 127* 115*  BUN 14 19 32*  CREATININE 1.00 0.80 0.89  CALCIUM 6.7* 7.0* 8.4*   LFT Recent Labs    04/10/21 0605  PROT 6.1*  ALBUMIN 2.6*  AST 27  ALT 43  ALKPHOS 85  BILITOT 0.3   PT/INR No results for input(s): LABPROT, INR in the last 72 hours.   Studies/Results: No results found.  Impression: Colitis on CT imaging, nausea/vomiting, diarrhea: Crohn's colitis vs. Infectious colitis. -GI pathogen panel 5/14 negative -C diff ordered, pending -CRP ordered, pending -Hgb 10.7, stable   Short bowel syndrome, on Gattex and TPN -Albumin 2.6 -Normal renal function (BUN 14/ Cr 1.00)  History of line infections, recent discitis  Plan: Await C. Diff screen.  If negative, we will plan to discuss with Dr. Domenica Fail, given complexity of disease.  Patient is not an ideal candidate for methylprednisolone or prednisone, given history of line  infections and recent discitis, but could consider increasing Budesonide. Patient may also need repeat colonoscopy to evaluate for active disease, given CT findings of colitis.  Continue supportive care.   [facility] will follow.   LOS: 2 days   Salley Slaughter  PA-C 04/10/2021, 11:17 AM  Contact #  [phone]

## 2021-04-10 NOTE — Progress Notes (Signed)
[place] [facility] PROGRESS NOTE    Caitlin Peters  [id number]:[id number] DOB: Jun 03, 1961 DOA: 04/07/2021 PCP: [name], [name], MD      Brief Narrative:  Caitlin Peters is a 60 y.o. F with complicated Crohn's disease, multiple bowel resections in [place], on Budesonide followed by [facility] GI Dr. Domenica Fail, now with short gut syndrome, TPN dependent, output managed with Gattex, chronic diffuse pain on methadone and PO Dilaudid, and moderate protein calorie malnutrition who presented with anorexia, vomiting/nausea, foul stools and severe abdominal pain for about 1 week.  In the ER, found to have hypokalemia, hypophosphatemia, hypomagnesemia and acidosis.         Assessment & Plan:  Hypokalemia, hypophosphatemia, hypomagnesemia, non-anion gap metabolic acidosis due to diarrhea Resolved   Diarrhea and abdominal pain  See summary re: Crohn's from prog note 5/14.    Now that electrolytes are repleted, she still has severe pain worse than baseline, requriring frequent dosing of IV Dilaudid, provoked by frequent painful nonbloody bowel movements.  Her diarrhea is typically managed with Gattex, which we do not have on formulary.  We have maxed out opiate anti-motility agents, and she has been underusing PRN Lomotil, and daughter has failed to bring Gattex to this point.  GI infectious panel negative.  Given her advanced Crohn's, I would seek GI opinion whether this could be Crohn's flare, or if it is functional in nature, like the episode last fall when she was scoped at [place], appreciate expertise  -Continue IV dilaudid and EtCO2 -Schedule Lomotil    Crohn's disaese Short gut syndrome -Continue budesonide -Continue pancreas enzymes -Continue vitamin D, B12  -Continue TPN per pharmacy -Continue insulin correction insulin     Chronic pain syndrome Mood disorder -Continue methadone -Hold home oral dilaudid -Continue Duloxetine, bupropion  Hyperetnsion BP  elevated -Continue hydralazine, amlodipine -Continue PRN labetalol for severe range pressures   Epilepsy No seizures here -Continue Keppra  GERD -Continue PPI  Anemia of chronic disease Hgb stable, no clinical bleeding            Disposition: Status is: Inpatient  Remains inpatient appropriate because:Ongoing active pain requiring inpatient pain management   Dispo: The patient is from: Home              Anticipated d/c is to: Home              Patient currently is not medically stable to d/c.   Difficult to place patient No       Level of care: Med-Surg       MDM: The below labs and imaging reports were reviewed and summarized above.  Medication management as above.  Frequent dosing of IV opiates.    DVT prophylaxis: Place and maintain sequential compression device Start: 04/09/21 0917  Code Status: FULL Family Communication: Called to daughter multiple times this week, no answer       Subjective: Still has 4-5 bowel movements per day, severely painful with each bowel movement which is not her norm, no vomiting, hematochezia, fever.       Objective: Vitals:   04/09/21 1106 04/09/21 1336 04/09/21 2001 04/10/21 0539  BP: (!) 163/90 (!) 141/83 (!) 155/85 (!) 150/62  Pulse: 67 68 70 (!) 42  Resp: 15 17 20 15   Temp: (!) 97.5 F (36.4 C) 98.1 F (36.7 C) 98.4 F (36.9 C) 99 F (37.2 C)  TempSrc: Oral  Oral   SpO2: 100% 100% 100% 93%  Weight:  63.5 kg  Height:        Intake/Output Summary (Last 24 hours) at 04/10/2021 1120 Last data filed at 04/10/2021 0400 Gross per 24 hour  Intake 2271.26 ml  Output --  Net 2271.26 ml   Filed Weights   04/08/21 0453 04/09/21 0523 04/10/21 0539  Weight: 62.5 kg 63.1 kg 63.5 kg    Examination: General appearance: Thin adult female, lying in bed, rouses easily, interactive     HEENT: Anicteric, conjunctival pink, lids and lashes normal.  No nasal deformity, discharge, or epistaxis.  Oropharynx  moist, no oral lesions, hearing normal Skin: No suspicious rashes or lesions Cardiac: RRR, no murmurs, no lower extremity edema Respiratory: Normal respiratory rate and rhythm, lungs clear without rales or wheezes Abdomen: Diffuse tenderness to palpation, voluntary guarding, no rigidity, rebound, distention. MSK:  Neuro: Awake and alert, extraocular movements intact, moves all extremities with normal strength and coordination, speech fluent Psych: Attention normal, affect normal, judgment insight appear normal          Data Reviewed: I have personally reviewed following labs and imaging studies:  CBC: Recent Labs  Lab 04/06/21 1637 04/07/21 1424 04/08/21 0500 04/10/21 0605  WBC 4.2 4.8 3.2* 4.9  NEUTROABS 1.9 3.1 1.5* 2.4  HGB 10.4* 10.9* 9.8* 10.7*  HCT 32.6* 33.0* 30.9* 32.7*  MCV 91.8 91.7 94.2 91.9  PLT 213 215 192 762   Basic Metabolic Panel: Recent Labs  Lab 04/06/21 1637 04/07/21 1424 04/08/21 0500 04/09/21 0345 04/10/21 0605  NA 140 142 137 139 137  K 3.8 3.1* 3.8 4.1 4.2  CL 116* 119* 114* 113* 106  CO2 20* 17* 18* 24 24  GLUCOSE 101* 115* 119* 127* 115*  BUN 20 17 14 19  32*  CREATININE 0.90 1.02* 1.00 0.80 0.89  CALCIUM 7.2* 7.3* 6.7* 7.0* 8.4*  MG  --  1.4* 1.5* 1.8 2.0  PHOS  --  2.0* 1.6* 2.9 3.6   GFR: Estimated Creatinine Clearance: 65.4 mL/min (by C-G formula based on SCr of 0.89 mg/dL). Liver Function Tests: Recent Labs  Lab 04/06/21 1637 04/07/21 1424 04/08/21 0500 04/09/21 0345 04/10/21 0605  AST 18 26 29   --  27  ALT 23 29 34  --  43  ALKPHOS 81 88 78  --  85  BILITOT 0.4 <0.1* 0.5  --  0.3  PROT 6.4* 6.4* 5.9*  --  6.1*  ALBUMIN 2.7* 2.8* 2.6* 2.3* 2.6*   Recent Labs  Lab 04/06/21 1637 04/07/21 1424  LIPASE 31 37   No results for input(s): AMMONIA in the last 168 hours. Coagulation Profile: No results for input(s): INR, PROTIME in the last 168 hours. Cardiac Enzymes: No results for input(s): CKTOTAL, CKMB, CKMBINDEX,  TROPONINI in the last 168 hours. BNP (last 3 results) No results for input(s): PROBNP in the last 8760 hours. HbA1C: No results for input(s): HGBA1C in the last 72 hours. CBG: Recent Labs  Lab 04/09/21 1148 04/09/21 1714 04/09/21 2008 04/10/21 0016 04/10/21 0901  GLUCAP 139* 104* 128* 110* 110*   Lipid Profile: Recent Labs    04/08/21 0500 04/10/21 0605  TRIG 121 44   Thyroid Function Tests: No results for input(s): TSH, T4TOTAL, FREET4, T3FREE, THYROIDAB in the last 72 hours. Anemia Panel: No results for input(s): VITAMINB12, FOLATE, FERRITIN, TIBC, IRON, RETICCTPCT in the last 72 hours. Urine analysis:    Component Value Date/Time   COLORURINE YELLOW 04/06/2021 1637   APPEARANCEUR HAZY (A) 04/06/2021 1637   LABSPEC 1.010 04/06/2021 1637  PHURINE 7.0 04/06/2021 1637   GLUCOSEU NEGATIVE 04/06/2021 1637   [place] 04/06/2021 [place] 04/06/2021 [place] 04/06/2021 1637   PROTEINUR >=300 (A) 04/06/2021 1637   NITRITE NEGATIVE 04/06/2021 1637   LEUKOCYTESUR SMALL (A) 04/06/2021 1637   Sepsis Labs: @LABRCNTIP (procalcitonin:4,lacticacidven:4)  ) Recent Results (from the past 240 hour(s))  Resp Panel by RT-PCR (Flu A&B, Covid) Nasopharyngeal Swab     Status: None   Collection Time: 04/07/21  5:58 PM   Specimen: Nasopharyngeal Swab; Nasopharyngeal(NP) swabs in vial transport medium  Result Value Ref Range Status   SARS Coronavirus 2 by RT PCR NEGATIVE NEGATIVE Final    Comment: (NOTE) SARS-CoV-2 target nucleic acids are NOT DETECTED.  The SARS-CoV-2 RNA is generally detectable in upper respiratory specimens during the acute phase of infection. The lowest concentration of SARS-CoV-2 viral copies this assay can detect is 138 copies/mL. A negative result does not preclude SARS-Cov-2 infection and should not be used as the sole basis for treatment or other patient management decisions. A negative result may occur with   improper specimen collection/handling, submission of specimen other than nasopharyngeal swab, presence of viral mutation(s) within the areas targeted by this assay, and inadequate number of viral copies(<138 copies/mL). A negative result must be combined with clinical observations, patient history, and epidemiological information. The expected result is Negative.  Fact Sheet for Patients:  EntrepreneurPulse.com.au  Fact Sheet for Healthcare Providers:  IncredibleEmployment.be  This test is no t yet approved or cleared by the [place] FDA and  has been authorized for detection and/or diagnosis of SARS-CoV-2 by FDA under an Emergency Use Authorization (EUA). This EUA will remain  in effect (meaning this test can be used) for the duration of the COVID-19 declaration under Section 564(b)(1) of the Act, 21 U.S.C.section 360bbb-3(b)(1), unless the authorization is terminated  or revoked sooner.       Influenza A by PCR NEGATIVE NEGATIVE Final   Influenza B by PCR NEGATIVE NEGATIVE Final    Comment: (NOTE) The Xpert Xpress SARS-CoV-2/FLU/RSV plus assay is intended as an aid in the diagnosis of influenza from Nasopharyngeal swab specimens and should not be used as a sole basis for treatment. Nasal washings and aspirates are unacceptable for Xpert Xpress SARS-CoV-2/FLU/RSV testing.  Fact Sheet for Patients: EntrepreneurPulse.com.au  Fact Sheet for Healthcare Providers: IncredibleEmployment.be  This test is not yet approved or cleared by the [place] FDA and has been authorized for detection and/or diagnosis of SARS-CoV-2 by FDA under an Emergency Use Authorization (EUA). This EUA will remain in effect (meaning this test can be used) for the duration of the COVID-19 declaration under Section 564(b)(1) of the Act, 21 U.S.C. section 360bbb-3(b)(1), unless the authorization is terminated  or revoked.  Performed at [facility], [place] [address] [place], [place] [address]   Gastrointestinal Panel by PCR , Stool     Status: None   Collection Time: 04/08/21  2:00 AM   Specimen: Stool  Result Value Ref Range Status   Campylobacter species NOT DETECTED NOT DETECTED Final   Plesimonas shigelloides NOT DETECTED NOT DETECTED Final   Salmonella species NOT DETECTED NOT DETECTED Final   Yersinia enterocolitica NOT DETECTED NOT DETECTED Final   Vibrio species NOT DETECTED NOT DETECTED Final   Vibrio cholerae NOT DETECTED NOT DETECTED Final   Enteroaggregative E coli (EAEC) NOT DETECTED NOT DETECTED Final   Enteropathogenic E coli (EPEC) NOT DETECTED NOT DETECTED Final   Enterotoxigenic E  coli (ETEC) NOT DETECTED NOT DETECTED Final   Shiga like toxin producing E coli (STEC) NOT DETECTED NOT DETECTED Final   Shigella/Enteroinvasive E coli (EIEC) NOT DETECTED NOT DETECTED Final   Cryptosporidium NOT DETECTED NOT DETECTED Final   Cyclospora cayetanensis NOT DETECTED NOT DETECTED Final   Entamoeba histolytica NOT DETECTED NOT DETECTED Final   Giardia lamblia NOT DETECTED NOT DETECTED Final   Adenovirus F40/41 NOT DETECTED NOT DETECTED Final   Astrovirus NOT DETECTED NOT DETECTED Final   Norovirus GI/GII NOT DETECTED NOT DETECTED Final   Rotavirus A NOT DETECTED NOT DETECTED Final   Sapovirus (I, II, IV, and V) NOT DETECTED NOT DETECTED Final    Comment: Performed at [facility], [address], [place], [place] [address]         Radiology Studies: No results found.      Scheduled Meds: . amLODipine  10 mg Oral Daily  . budesonide  3 mg Oral QODAY  . buPROPion  200 mg Oral Daily  . calcium carbonate  500 mg of elemental calcium Oral Daily  . Chlorhexidine Gluconate Cloth  6 each Topical Daily  . cholecalciferol  1,000 Units Oral Daily  . cycloSPORINE  1 drop Both Eyes BID  . diphenoxylate-atropine  1 tablet Oral QID  . DULoxetine  90 mg  Oral Daily  . hydrALAZINE  75 mg Oral TID  . insulin aspart  0-9 Units Subcutaneous Q6H  . methadone  5 mg Oral 5 X Daily  . metoprolol tartrate  100 mg Oral BID  . multivitamin with minerals   Oral Daily  . pantoprazole  40 mg Oral Daily  . polyvinyl alcohol  1 drop Both Eyes QID  . sodium chloride flush  10-40 mL Intracatheter Q12H  . sodium chloride flush  3 mL Intravenous Q12H  . vitamin B-12  1,000 mcg Oral Daily   Continuous Infusions: . levETIRAcetam 500 mg (04/10/21 1039)  . TPN CYCLIC-ADULT (ION)       LOS: 2 days    Time spent: 35 minutes    Edwin Dada, MD [facility] 04/10/2021, 11:20 AM     Please page though [place] or Epic secure chat:  For [employer], Adult nurse

## 2021-04-11 DIAGNOSIS — K5080 Crohn's disease of both small and large intestine without complications: Secondary | ICD-10-CM | POA: Diagnosis not present

## 2021-04-11 DIAGNOSIS — K529 Noninfective gastroenteritis and colitis, unspecified: Secondary | ICD-10-CM | POA: Diagnosis not present

## 2021-04-11 DIAGNOSIS — F1120 Opioid dependence, uncomplicated: Secondary | ICD-10-CM | POA: Diagnosis not present

## 2021-04-11 DIAGNOSIS — E876 Hypokalemia: Secondary | ICD-10-CM | POA: Diagnosis not present

## 2021-04-11 DIAGNOSIS — G894 Chronic pain syndrome: Secondary | ICD-10-CM | POA: Diagnosis not present

## 2021-04-11 DIAGNOSIS — K50819 Crohn's disease of both small and large intestine with unspecified complications: Secondary | ICD-10-CM | POA: Diagnosis not present

## 2021-04-11 LAB — GLUCOSE, CAPILLARY
Glucose-Capillary: 102 mg/dL — ABNORMAL HIGH (ref 70–99)
Glucose-Capillary: 108 mg/dL — ABNORMAL HIGH (ref 70–99)
Glucose-Capillary: 121 mg/dL — ABNORMAL HIGH (ref 70–99)
Glucose-Capillary: 82 mg/dL (ref 70–99)
Glucose-Capillary: 93 mg/dL (ref 70–99)

## 2021-04-11 LAB — BASIC METABOLIC PANEL
Anion gap: 4 — ABNORMAL LOW (ref 5–15)
BUN: 41 mg/dL — ABNORMAL HIGH (ref 6–20)
CO2: 27 mmol/L (ref 22–32)
Calcium: 8.8 mg/dL — ABNORMAL LOW (ref 8.9–10.3)
Chloride: 106 mmol/L (ref 98–111)
Creatinine, Ser: 1.17 mg/dL — ABNORMAL HIGH (ref 0.44–1.00)
GFR, Estimated: 53 mL/min — ABNORMAL LOW (ref >60)
Glucose, Bld: 119 mg/dL — ABNORMAL HIGH (ref 70–99)
Potassium: 4.2 mmol/L (ref 3.5–5.1)
Sodium: 137 mmol/L (ref 135–145)

## 2021-04-11 LAB — C DIFFICILE (CDIFF) QUICK SCRN (NO PCR REFLEX)
C Diff antigen: NEGATIVE
C Diff interpretation: NOT DETECTED
C Diff toxin: NEGATIVE

## 2021-04-11 LAB — PHOSPHORUS: Phosphorus: 4 mg/dL (ref 2.5–4.6)

## 2021-04-11 LAB — C-REACTIVE PROTEIN: CRP: 0.8 mg/dL (ref <1.0)

## 2021-04-11 LAB — HIGH SENSITIVITY CRP: CRP, High Sensitivity: 0.48 mg/L (ref 0.00–3.00)

## 2021-04-11 LAB — MAGNESIUM: Magnesium: 1.9 mg/dL (ref 1.7–2.4)

## 2021-04-11 MED ORDER — TRAVASOL 10 % IV SOLN
INTRAVENOUS | Status: AC
  Filled 2021-04-11: qty 990

## 2021-04-11 MED ORDER — LEVETIRACETAM 500 MG PO TABS
500.0000 mg | ORAL_TABLET | Freq: Two times a day (BID) | ORAL | Status: DC
  Administered 2021-04-11 – 2021-04-15 (×8): 500 mg via ORAL
  Filled 2021-04-11 (×9): qty 1

## 2021-04-11 NOTE — Progress Notes (Signed)
[place] [facility] PROGRESS NOTE    Caitlin Peters  GGY:[id number] DOB: 09-08-1961 DOA: 04/07/2021 PCP: [name], [name], MD      Brief Narrative:  Caitlin Peters is a 60 y.o. F with complicated Crohn's disease, multiple bowel resections in [place], on Budesonide followed by [facility] GI Dr. Domenica Fail, now with short gut syndrome, TPN dependent, output managed with Gattex, chronic diffuse pain on methadone and PO Dilaudid, and moderate protein calorie malnutrition who presented with anorexia, vomiting/nausea, foul stools and severe abdominal pain for about 1 week.  In the ER, found to have hypokalemia, hypophosphatemia, hypomagnesemia and acidosis.         Assessment & Plan:  Hypokalemia, hypophosphatemia, hypomagnesemia, non-anion gap metabolic acidosis due to diarrhea Resolved   Diarrhea and abdominal pain -> Crohn's flare vs. Functional vomiting syndrome See summary re: Crohn's from prog note 5/14.    Now that electrolytes are repleted, she still has severe pain worse than baseline, requriring frequent dosing of IV Dilaudid, provoked by frequent painful nonbloody bowel movements.  Maxed out opiate anti-motility agents and Lomotil.  On home Gattex but still painful loose stools.  GI PCR panel negative, but unfortunately Cdiff not sent, and this is now pending.  GI consulted, if Cdiff negative, they will consider colonoscopy and will discuss with Dr. Domenica Fail.    Vomited multiple times overnight, still with >4 loose stools per day -Consult GI, appreciate expertise  -Continue TPN -Continue IV anti-emetics -Continue IV dilaudid and EtCO2  -Continue Gattex and scheduled Lomotil    Crohn's disease Short gut syndrome -Continue budesonide -Continue pancreas enzymes  -Continue TPN -Continue insulin correction insulin  -Hold MVI, vit D, B12 given nausea, resume at discharge   Chronic pain syndrome Mood disorder -Continue methadone -Hold home oral  dilaudid -Continue duloxetine, bupropion  Hypertension BP elevated -Continue home hydralazine, amlodipine -Continue PRN labetalol for severe range pressures   History of seizures No seizure activity -Continue Keppra  GERD -Continue PPI  Anemia of chronic disease Hemoglobin near 10, no bleeding            Disposition: Status is: Inpatient  Remains inpatient appropriate because:Ongoing active pain requiring inpatient pain management, GI work-up pending   Dispo: The patient is from: Home              Anticipated d/c is to: Home              Patient currently is not medically stable to d/c.   Difficult to place patient No       Level of care: Med-Surg       MDM: The below labs and imaging reports were reviewed and summarized above.  Medication management as above.  Frequent dosing of IV opiates.    DVT prophylaxis: Place and maintain sequential compression device Start: 04/09/21 0917  Code Status: FULL Family Communication:        Subjective: Vomited NBNB emesis multiple times overnight, also still having frequent loose bowel movements.  Severe abdominal cramping after bowel movements.  No confusion, fever.       Objective: Vitals:   04/09/21 2001 04/10/21 0539 04/10/21 1400 04/11/21 0446  BP: (!) 155/85 (!) 150/62 (!) 166/85 (!) 154/69  Pulse: 70 (!) 42 74 81  Resp: 20 15 19 20   Temp: 98.4 F (36.9 C) 99 F (37.2 C) 98.3 F (36.8 C) 99 F (37.2 C)  TempSrc: Oral  Oral Oral  SpO2: 100% 93%  97%  Weight:  63.5 kg  Height:        Intake/Output Summary (Last 24 hours) at 04/11/2021 1021 Last data filed at 04/11/2021 0900 Gross per 24 hour  Intake 2201.27 ml  Output --  Net 2201.27 ml   Filed Weights   04/08/21 0453 04/09/21 0523 04/10/21 0539  Weight: 62.5 kg 63.1 kg 63.5 kg    Examination: General appearance: Thin adult female, lying in bed sleepy, but arouses easily and is interactive     HEENT:    Skin:  Cardiac: RRR, no  murmurs, no lower extremity edema Respiratory: Normal respiratory rate and rhythm, clear without rales or wheezes Abdomen: Abdomen with diffuse tenderness palpation, voluntary guarding, no rigidity, rebound or distention MSK: Mild diffuse loss of subcutaneous muscle mass and fat, no deformities or effusions Neuro: Awake and alert, extraocular movements intact, moves all extremities with generalized weakness, but symmetric strength, face symmetric, speech Psych: Attention normal, affect blunted, judgment insight impaired normal, no hallucinations              Data Reviewed: I have personally reviewed following labs and imaging studies:  CBC: Recent Labs  Lab 04/06/21 1637 04/07/21 1424 04/08/21 0500 04/10/21 0605  WBC 4.2 4.8 3.2* 4.9  NEUTROABS 1.9 3.1 1.5* 2.4  HGB 10.4* 10.9* 9.8* 10.7*  HCT 32.6* 33.0* 30.9* 32.7*  MCV 91.8 91.7 94.2 91.9  PLT 213 215 192 903   Basic Metabolic Panel: Recent Labs  Lab 04/07/21 1424 04/08/21 0500 04/09/21 0345 04/10/21 0605 04/11/21 0824  NA 142 137 139 137 137  K 3.1* 3.8 4.1 4.2 4.2  CL 119* 114* 113* 106 106  CO2 17* 18* 24 24 27   GLUCOSE 115* 119* 127* 115* 119*  BUN 17 14 19  32* 41*  CREATININE 1.02* 1.00 0.80 0.89 1.17*  CALCIUM 7.3* 6.7* 7.0* 8.4* 8.8*  MG 1.4* 1.5* 1.8 2.0 1.9  PHOS 2.0* 1.6* 2.9 3.6 4.0   GFR: Estimated Creatinine Clearance: 49.7 mL/min (A) (by C-G formula based on SCr of 1.17 mg/dL (H)). Liver Function Tests: Recent Labs  Lab 04/06/21 1637 04/07/21 1424 04/08/21 0500 04/09/21 0345 04/10/21 0605  AST 18 26 29   --  27  ALT 23 29 34  --  43  ALKPHOS 81 88 78  --  85  BILITOT 0.4 <0.1* 0.5  --  0.3  PROT 6.4* 6.4* 5.9*  --  6.1*  ALBUMIN 2.7* 2.8* 2.6* 2.3* 2.6*   Recent Labs  Lab 04/06/21 1637 04/07/21 1424  LIPASE 31 37   No results for input(s): AMMONIA in the last 168 hours. Coagulation Profile: No results for input(s): INR, PROTIME in the last 168 hours. Cardiac Enzymes: No  results for input(s): CKTOTAL, CKMB, CKMBINDEX, TROPONINI in the last 168 hours. BNP (last 3 results) No results for input(s): PROBNP in the last 8760 hours. HbA1C: No results for input(s): HGBA1C in the last 72 hours. CBG: Recent Labs  Lab 04/10/21 0901 04/10/21 1218 04/10/21 1959 04/10/21 2337 04/11/21 0752  GLUCAP 110* 102* 140* 113* 108*   Lipid Profile: Recent Labs    04/10/21 0605  TRIG 44   Thyroid Function Tests: No results for input(s): TSH, T4TOTAL, FREET4, T3FREE, THYROIDAB in the last 72 hours. Anemia Panel: No results for input(s): VITAMINB12, FOLATE, FERRITIN, TIBC, IRON, RETICCTPCT in the last 72 hours. Urine analysis:    Component Value Date/Time   COLORURINE YELLOW 04/06/2021 1637   APPEARANCEUR HAZY (A) 04/06/2021 1637   LABSPEC 1.010 04/06/2021 1637   PHURINE 7.0 04/06/2021 1637  GLUCOSEU NEGATIVE 04/06/2021 [place] 04/06/2021 [place] 04/06/2021 [place] 04/06/2021 1637   PROTEINUR >=300 (A) 04/06/2021 1637   NITRITE NEGATIVE 04/06/2021 1637   LEUKOCYTESUR SMALL (A) 04/06/2021 1637   Sepsis Labs: @LABRCNTIP (procalcitonin:4,lacticacidven:4)  ) Recent Results (from the past 240 hour(s))  Resp Panel by RT-PCR (Flu A&B, Covid) Nasopharyngeal Swab     Status: None   Collection Time: 04/07/21  5:58 PM   Specimen: Nasopharyngeal Swab; Nasopharyngeal(NP) swabs in vial transport medium  Result Value Ref Range Status   SARS Coronavirus 2 by RT PCR NEGATIVE NEGATIVE Final    Comment: (NOTE) SARS-CoV-2 target nucleic acids are NOT DETECTED.  The SARS-CoV-2 RNA is generally detectable in upper respiratory specimens during the acute phase of infection. The lowest concentration of SARS-CoV-2 viral copies this assay can detect is 138 copies/mL. A negative result does not preclude SARS-Cov-2 infection and should not be used as the sole basis for treatment or other patient management decisions. A negative  result may occur with  improper specimen collection/handling, submission of specimen other than nasopharyngeal swab, presence of viral mutation(s) within the areas targeted by this assay, and inadequate number of viral copies(<138 copies/mL). A negative result must be combined with clinical observations, patient history, and epidemiological information. The expected result is Negative.  Fact Sheet for Patients:  EntrepreneurPulse.com.au  Fact Sheet for Healthcare Providers:  IncredibleEmployment.be  This test is no t yet approved or cleared by the [place] FDA and  has been authorized for detection and/or diagnosis of SARS-CoV-2 by FDA under an Emergency Use Authorization (EUA). This EUA will remain  in effect (meaning this test can be used) for the duration of the COVID-19 declaration under Section 564(b)(1) of the Act, 21 U.S.C.section 360bbb-3(b)(1), unless the authorization is terminated  or revoked sooner.       Influenza A by PCR NEGATIVE NEGATIVE Final   Influenza B by PCR NEGATIVE NEGATIVE Final    Comment: (NOTE) The Xpert Xpress SARS-CoV-2/FLU/RSV plus assay is intended as an aid in the diagnosis of influenza from Nasopharyngeal swab specimens and should not be used as a sole basis for treatment. Nasal washings and aspirates are unacceptable for Xpert Xpress SARS-CoV-2/FLU/RSV testing.  Fact Sheet for Patients: EntrepreneurPulse.com.au  Fact Sheet for Healthcare Providers: IncredibleEmployment.be  This test is not yet approved or cleared by the [place] FDA and has been authorized for detection and/or diagnosis of SARS-CoV-2 by FDA under an Emergency Use Authorization (EUA). This EUA will remain in effect (meaning this test can be used) for the duration of the COVID-19 declaration under Section 564(b)(1) of the Act, 21 U.S.C. section 360bbb-3(b)(1), unless the authorization is  terminated or revoked.  Performed at [facility], [place] [address], [place], [place] [address]   Gastrointestinal Panel by PCR , Stool     Status: None   Collection Time: 04/08/21  2:00 AM   Specimen: Stool  Result Value Ref Range Status   Campylobacter species NOT DETECTED NOT DETECTED Final   Plesimonas shigelloides NOT DETECTED NOT DETECTED Final   Salmonella species NOT DETECTED NOT DETECTED Final   Yersinia enterocolitica NOT DETECTED NOT DETECTED Final   Vibrio species NOT DETECTED NOT DETECTED Final   Vibrio cholerae NOT DETECTED NOT DETECTED Final   Enteroaggregative E coli (EAEC) NOT DETECTED NOT DETECTED Final   Enteropathogenic E coli (EPEC) NOT DETECTED NOT DETECTED Final   Enterotoxigenic E coli (ETEC) NOT DETECTED NOT DETECTED  Final   Shiga like toxin producing E coli (STEC) NOT DETECTED NOT DETECTED Final   Shigella/Enteroinvasive E coli (EIEC) NOT DETECTED NOT DETECTED Final   Cryptosporidium NOT DETECTED NOT DETECTED Final   Cyclospora cayetanensis NOT DETECTED NOT DETECTED Final   Entamoeba histolytica NOT DETECTED NOT DETECTED Final   Giardia lamblia NOT DETECTED NOT DETECTED Final   Adenovirus F40/41 NOT DETECTED NOT DETECTED Final   Astrovirus NOT DETECTED NOT DETECTED Final   Norovirus GI/GII NOT DETECTED NOT DETECTED Final   Rotavirus A NOT DETECTED NOT DETECTED Final   Sapovirus (I, II, IV, and V) NOT DETECTED NOT DETECTED Final    Comment: Performed at [facility], [address], [place], [place] [address]         Radiology Studies: No results found.      Scheduled Meds: . amLODipine  10 mg Oral Daily  . budesonide  3 mg Oral QODAY  . buPROPion  200 mg Oral Daily  . Chlorhexidine Gluconate Cloth  6 each Topical Daily  . cycloSPORINE  1 drop Both Eyes BID  . diphenoxylate-atropine  1 tablet Oral BID  . DULoxetine  90 mg Oral Daily  . hydrALAZINE  75 mg Oral TID  . insulin aspart  0-9 Units Subcutaneous Q6H  .  methadone  5 mg Oral 5 X Daily  . metoprolol tartrate  100 mg Oral BID  . pantoprazole  40 mg Oral Daily  . polyvinyl alcohol  1 drop Both Eyes QID  . sodium chloride flush  10-40 mL Intracatheter Q12H  . sodium chloride flush  3 mL Intravenous Q12H  . Teduglutide (rDNA)  1.5 mg Subcutaneous Daily   Continuous Infusions: . levETIRAcetam 500 mg (04/10/21 2111)     LOS: 3 days    Time spent: 25 minutes    Edwin Dada, MD [facility] 04/11/2021, 10:21 AM     Please page though [place] or Epic secure chat:  For [employer], Adult nurse

## 2021-04-11 NOTE — Progress Notes (Signed)
C. Diff and fecal calprotectin pending.   We will await C. Diff results.  Continue supportive care.  [facility] will follow.

## 2021-04-11 NOTE — Progress Notes (Signed)
PHARMACY - TOTAL PARENTERAL NUTRITION CONSULT NOTE   Indication: Short bowel syndrome  Patient Measurements: Height: 5' 7"  (170.2 cm) Weight: 63.5 kg (139 lb 15.9 oz) IBW/kg (Calculated) : 61.6 TPN AdjBW (KG): 61 Body mass index is 21.93 kg/m. 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 Pharmacy to continue TPN while admitted.  Glucose / Insulin: cbgs (goal <150): wnl - sSSI q6h:  1 unit / 24 hrs Electrolytes: Na, K, Mag, Phos, CorrCa, all WNL.  Cl:CO2 WNL - on oscal 570m daily Renal: scr increased from 0.89 > 1.17, BUN inc to 41 Hepatic: LFTs, Tbili wnl - TG 121 (5/14), 44 (5/16) Prealbumin 26.9 (5/14), inc to 28.6 (5/16) Intake / Output; MIVF: net I/O inaccurate.  Urine, Stool occurrences not documented.  Reports of ongoing N/V/D.  Cdiff testing ordered.  Home Gattex resumed. GI Imaging: - 5/13 abd CT: Distal colitis, which given the clinical history, is most likely due to inflammatory bowel disease. Extensive surgical changes of enterotomy and partial colectomy. No bowel obstruction GI Surgeries / Procedures:   Central access: right femoral PICC TPN start date: on TPN PTA --> continuing inpatient  Nutritional Goals: (per Dietitian on 5/15) Goals:  Kcal:  1800-2000 kcal, Protein:  90-100 grams, Fluid:  >/= 1.8 L/day Inpatient Cyclic TPN 117333mL over 12 hrs provides: 99 g of protein and 1847 kcals per day.    Home TPN formula: 16701mL cyclic over 12 hours (1 hr taper up/down) at 6pm - Total Kcal: 1824 kcal with lipid on MWF, and 1224 kcal without lipid on TTSS - Protein: 85 gm - dextrose: 260 gm - lipid: 60 gm (contains lipid on MWF only)   Current Nutrition:  TPN, Regular diet  Plan:  - Continue cyclic TPN with  14103ml infuse over 12 hrs starting at 1800 (change to daily lipid formula on 5/17) - Electrolytes in TPN: Na 80 mEq/L, K 527m/L, Ca to 5 mEq/L, Mg 33m27mL, and Phos 133m46mL. Ratio 1 Cl:2 Ac - Add standard MVI and trace elements to TPN - Continue Sensitive q6h SSI and adjust as needed (CBGs: 2 hrs past cyclic TPN start + 1 hr past cycle TPN d/c'ed + middle of TPN infusion + while off TPN --> 0000, 0700, 1200, 2000) - Monitor TPN labs on Mon/Thurs - Pt to bring in her home supply of Gattex, started on 5/16   ChriGretta ArabrmD, BCPS Clinical Pharmacist [facility] main pharmacy [phone]/2022 7:08 AM

## 2021-04-12 DIAGNOSIS — E876 Hypokalemia: Secondary | ICD-10-CM | POA: Diagnosis not present

## 2021-04-12 DIAGNOSIS — K529 Noninfective gastroenteritis and colitis, unspecified: Secondary | ICD-10-CM | POA: Diagnosis not present

## 2021-04-12 DIAGNOSIS — K5080 Crohn's disease of both small and large intestine without complications: Secondary | ICD-10-CM | POA: Diagnosis not present

## 2021-04-12 LAB — BASIC METABOLIC PANEL
Anion gap: 5 (ref 5–15)
BUN: 42 mg/dL — ABNORMAL HIGH (ref 6–20)
CO2: 24 mmol/L (ref 22–32)
Calcium: 7.9 mg/dL — ABNORMAL LOW (ref 8.9–10.3)
Chloride: 108 mmol/L (ref 98–111)
Creatinine, Ser: 1.19 mg/dL — ABNORMAL HIGH (ref 0.44–1.00)
GFR, Estimated: 52 mL/min — ABNORMAL LOW (ref >60)
Glucose, Bld: 133 mg/dL — ABNORMAL HIGH (ref 70–99)
Potassium: 4.1 mmol/L (ref 3.5–5.1)
Sodium: 137 mmol/L (ref 135–145)

## 2021-04-12 LAB — CBC
HCT: 27.4 % — ABNORMAL LOW (ref 36.0–46.0)
Hemoglobin: 8.8 g/dL — ABNORMAL LOW (ref 12.0–15.0)
MCH: 29.8 pg (ref 26.0–34.0)
MCHC: 32.1 g/dL (ref 30.0–36.0)
MCV: 92.9 fL (ref 80.0–100.0)
Platelets: 178 10*3/uL (ref 150–400)
RBC: 2.95 MIL/uL — ABNORMAL LOW (ref 3.87–5.11)
RDW: 14.9 % (ref 11.5–15.5)
WBC: 5 10*3/uL (ref 4.0–10.5)
nRBC: 0 % (ref 0.0–0.2)

## 2021-04-12 LAB — GLUCOSE, CAPILLARY
Glucose-Capillary: 114 mg/dL — ABNORMAL HIGH (ref 70–99)
Glucose-Capillary: 117 mg/dL — ABNORMAL HIGH (ref 70–99)
Glucose-Capillary: 131 mg/dL — ABNORMAL HIGH (ref 70–99)
Glucose-Capillary: 95 mg/dL (ref 70–99)

## 2021-04-12 LAB — PHOSPHORUS: Phosphorus: 3.4 mg/dL (ref 2.5–4.6)

## 2021-04-12 LAB — MAGNESIUM: Magnesium: 1.9 mg/dL (ref 1.7–2.4)

## 2021-04-12 MED ORDER — LACTATED RINGERS IV SOLN: INTRAVENOUS | Status: DC

## 2021-04-12 MED ORDER — DIPHENOXYLATE-ATROPINE 2.5-0.025 MG PO TABS
1.0000 | ORAL_TABLET | Freq: Four times a day (QID) | ORAL | Status: DC
  Administered 2021-04-12 – 2021-04-15 (×12): 1 via ORAL
  Filled 2021-04-12 (×12): qty 1

## 2021-04-12 MED ORDER — HYDROMORPHONE HCL 1 MG/ML IJ SOLN
1.0000 mg | INTRAMUSCULAR | Status: DC | PRN
Start: 2021-04-12 — End: 2021-04-14
  Administered 2021-04-12: 1 mg via INTRAVENOUS
  Administered 2021-04-12: 0.5 mg via INTRAVENOUS
  Administered 2021-04-12 – 2021-04-14 (×11): 1 mg via INTRAVENOUS
  Filled 2021-04-12 (×13): qty 1

## 2021-04-12 MED ORDER — LOPERAMIDE HCL 2 MG PO CAPS
2.0000 mg | ORAL_CAPSULE | Freq: Four times a day (QID) | ORAL | Status: DC
  Administered 2021-04-12 – 2021-04-15 (×12): 2 mg via ORAL
  Filled 2021-04-12 (×12): qty 1

## 2021-04-12 MED ORDER — TRACE MINERALS CU-MN-SE-ZN 300-55-60-3000 MCG/ML IV SOLN
INTRAVENOUS | Status: AC
  Filled 2021-04-12: qty 990

## 2021-04-12 NOTE — Progress Notes (Signed)
PROGRESS NOTE  Caitlin Peters ZDG:[id number] DOB: 09-20-1961 DOA: 04/07/2021 PCP: [name], [name], MD  HPI/Recap of past 24 hours:  Caitlin Peters is a 60 y.o. F with complicated Crohn's disease, multiple bowel resections in [place], on Budesonide followed by [facility] Dr. Domenica Fail, now with short gut syndrome, TPN dependent, output managed with Gattex, chronic diffuse pain on methadone and PO Dilaudid, and moderate protein calorie malnutrition who presented with anorexia, vomiting/nausea, foul stools and severe abdominal pain for about 1 week.  In the ER, found to have hypokalemia, hypophosphatemia, hypomagnesemia and acidosis.  04/12/21: Seen and examined at her bedside.  Reports diarrhea and abdominal cramping.   Assessment/Plan: Principal Problem:   Colitis Active Problems:   Hypokalemia   Chronic diarrhea   Crohn's disease of small & large intestine with SGS   GERD (gastroesophageal reflux disease)   Chronic pain syndrome   Anxiety   Anemia   Methadone dependence ([name])   CKD (chronic kidney disease) stage 3, GFR 30-59 ml/min ([name])   [name]   Acidosis  Resolved hypokalemia, hypophosphatemia, hypomagnesemia, non-anion gap metabolic acidosis due to diarrhea Continue TPN and IV fluid hydration Appreciate pharmacy's assistance.  Diarrhea and abdominal pain -> Crohn's flare vs. Functional vomiting syndrome GI following, appreciate assistance. Management per GI. Maxed out opiate anti-motility agents and Lomotil.  On home Gattex but still painful loose stools.  GI PCR panel negative, C. difficile PCR negative.  Continue supportive care IV fluid TPN Antimotility agents Antiemetics as needed  Anemia of chronic disease Drop in hemoglobin this morning 8.8 from 10.7 Monitor H&H No overt bleeding at this time  Crohn's disease Short gut syndrome -Continue budesonide -Continue pancreas enzymes -Continue TPN -Continue insulin correction insulin -Hold MVI, vit D,  B12 given nausea, resume at discharge  Chronic pain syndrome Mood disorder -Continue methadone -Hold home oral dilaudid -Continue duloxetine, bupropion  Hypertension BP elevated -Continue home hydralazine, amlodipine -Continue PRN labetalol for severe range pressures  History of seizures No seizure activity -Continue Keppra Seizure precautions  GERD -Continue PPI  Anemia of chronic disease Hemoglobin near 10, no bleeding            Disposition: Status is: Inpatient  Remains inpatient appropriate because:Ongoing active pain requiring inpatient pain management, GI work-up pending   Dispo: The patient is from: Home  Anticipated d/c is to: Home when GI signs OFF  Patient currently is not medically stable to d/c.              Difficult to place patient No      MDM: The below labs and imaging reports were reviewed and summarized above.  Medication management as above.  Frequent dosing of IV opiates.    DVT prophylaxis: Place and maintain sequential compression device Start: 04/09/21 0917  Code Status: FULL Family Communication:     Objective: Vitals:   04/11/21 2026 04/12/21 0500 04/12/21 0527 04/12/21 1402  BP: (!) 123/59  (!) 146/62 (!) 114/51  Pulse: 78  79 69  Resp: 15  15 12   Temp: 98.3 F (36.8 C)  98.8 F (37.1 C) 98.2 F (36.8 C)  TempSrc: Oral  Oral   SpO2: 100%  100% 98%  Weight:  63 kg    Height:        Intake/Output Summary (Last 24 hours) at 04/12/2021 1434 Last data filed at 04/12/2021 1304 Gross per 24 hour  Intake 835 ml  Output --  Net 835 ml   [employer]  04/09/21 0523 04/10/21 0539 04/12/21 0500  Weight: 63.1 kg 63.5 kg 63 kg    Exam:  . General: 60 y.o. year-old female well developed well nourished in no acute distress.  Alert and oriented x3.  Appears uncomfortable due to abdominal cramp. . Cardiovascular: Regular rate and rhythm with no rubs or gallops.  No  thyromegaly or JVD noted.   [name] Kitchen Respiratory: Clear to auscultation with no wheezes or rales.  Poor inspiratory effort. . Abdomen: Soft nontender nondistended with normal bowel sounds x4 quadrants. . Musculoskeletal: No lower extremity edema. 2/4 pulses in all 4 extremities. . Skin: No ulcerative lesions noted or rashes, . Psychiatry: Mood is appropriate for condition and setting   Data Reviewed: CBC: Recent Labs  Lab 04/06/21 1637 04/07/21 1424 04/08/21 0500 04/10/21 0605 04/12/21 0422  WBC 4.2 4.8 3.2* 4.9 5.0  NEUTROABS 1.9 3.1 1.5* 2.4  --   HGB 10.4* 10.9* 9.8* 10.7* 8.8*  HCT 32.6* 33.0* 30.9* 32.7* 27.4*  MCV 91.8 91.7 94.2 91.9 92.9  PLT 213 215 192 210 250   Basic Metabolic Panel: Recent Labs  Lab 04/08/21 0500 04/09/21 0345 04/10/21 0605 04/11/21 0824 04/12/21 0422  NA 137 139 137 137 137  K 3.8 4.1 4.2 4.2 4.1  CL 114* 113* 106 106 108  CO2 18* 24 24 27 24   GLUCOSE 119* 127* 115* 119* 133*  BUN 14 19 32* 41* 42*  CREATININE 1.00 0.80 0.89 1.17* 1.19*  CALCIUM 6.7* 7.0* 8.4* 8.8* 7.9*  MG 1.5* 1.8 2.0 1.9 1.9  PHOS 1.6* 2.9 3.6 4.0 3.4   GFR: Estimated Creatinine Clearance: 48.9 mL/min (A) (by C-G formula based on SCr of 1.19 mg/dL (H)). Liver Function Tests: Recent Labs  Lab 04/06/21 1637 04/07/21 1424 04/08/21 0500 04/09/21 0345 04/10/21 0605  AST 18 26 29   --  27  ALT 23 29 34  --  43  ALKPHOS 81 88 78  --  85  BILITOT 0.4 <0.1* 0.5  --  0.3  PROT 6.4* 6.4* 5.9*  --  6.1*  ALBUMIN 2.7* 2.8* 2.6* 2.3* 2.6*   Recent Labs  Lab 04/06/21 1637 04/07/21 1424  LIPASE 31 37   No results for input(s): AMMONIA in the last 168 hours. Coagulation Profile: No results for input(s): INR, PROTIME in the last 168 hours. Cardiac Enzymes: No results for input(s): CKTOTAL, CKMB, CKMBINDEX, TROPONINI in the last 168 hours. BNP (last 3 results) No results for input(s): PROBNP in the last 8760 hours. HbA1C: No results for input(s): HGBA1C in the last 72  hours. CBG: Recent Labs  Lab 04/11/21 1625 04/11/21 2027 04/11/21 2334 04/12/21 0606 04/12/21 1135  GLUCAP 82 102* 121* 114* 95   Lipid Profile: Recent Labs    04/10/21 0605  TRIG 44   Thyroid Function Tests: No results for input(s): TSH, T4TOTAL, FREET4, T3FREE, THYROIDAB in the last 72 hours. Anemia Panel: No results for input(s): VITAMINB12, FOLATE, FERRITIN, TIBC, IRON, RETICCTPCT in the last 72 hours. Urine analysis:    Component Value Date/Time   COLORURINE YELLOW 04/06/2021 1637   APPEARANCEUR HAZY (A) 04/06/2021 1637   LABSPEC 1.010 04/06/2021 1637   PHURINE 7.0 04/06/2021 1637   GLUCOSEU NEGATIVE 04/06/2021 1637   [place] 04/06/2021 [place] 04/06/2021 [place] 04/06/2021 1637   PROTEINUR >=300 (A) 04/06/2021 1637   NITRITE NEGATIVE 04/06/2021 1637   LEUKOCYTESUR SMALL (A) 04/06/2021 1637   Sepsis Labs: @LABRCNTIP (procalcitonin:4,lacticidven:4)  ) Recent Results (from the past  240 hour(s))  Resp Panel by RT-PCR (Flu A&B, Covid) Nasopharyngeal Swab     Status: None   Collection Time: 04/07/21  5:58 PM   Specimen: Nasopharyngeal Swab; Nasopharyngeal(NP) swabs in vial transport medium  Result Value Ref Range Status   SARS Coronavirus 2 by RT PCR NEGATIVE NEGATIVE Final    Comment: (NOTE) SARS-CoV-2 target nucleic acids are NOT DETECTED.  The SARS-CoV-2 RNA is generally detectable in upper respiratory specimens during the acute phase of infection. The lowest concentration of SARS-CoV-2 viral copies this assay can detect is 138 copies/mL. A negative result does not preclude SARS-Cov-2 infection and should not be used as the sole basis for treatment or other patient management decisions. A negative result may occur with  improper specimen collection/handling, submission of specimen other than nasopharyngeal swab, presence of viral mutation(s) within the areas targeted by this assay, and inadequate number of  viral copies(<138 copies/mL). A negative result must be combined with clinical observations, patient history, and epidemiological information. The expected result is Negative.  Fact Sheet for Patients:  EntrepreneurPulse.com.au  Fact Sheet for Healthcare Providers:  IncredibleEmployment.be  This test is no t yet approved or cleared by the [place] FDA and  has been authorized for detection and/or diagnosis of SARS-CoV-2 by FDA under an Emergency Use Authorization (EUA). This EUA will remain  in effect (meaning this test can be used) for the duration of the COVID-19 declaration under Section 564(b)(1) of the Act, 21 U.S.C.section 360bbb-3(b)(1), unless the authorization is terminated  or revoked sooner.       Influenza A by PCR NEGATIVE NEGATIVE Final   Influenza B by PCR NEGATIVE NEGATIVE Final    Comment: (NOTE) The Xpert Xpress SARS-CoV-2/FLU/RSV plus assay is intended as an aid in the diagnosis of influenza from Nasopharyngeal swab specimens and should not be used as a sole basis for treatment. Nasal washings and aspirates are unacceptable for Xpert Xpress SARS-CoV-2/FLU/RSV testing.  Fact Sheet for Patients: EntrepreneurPulse.com.au  Fact Sheet for Healthcare Providers: IncredibleEmployment.be  This test is not yet approved or cleared by the [place] FDA and has been authorized for detection and/or diagnosis of SARS-CoV-2 by FDA under an Emergency Use Authorization (EUA). This EUA will remain in effect (meaning this test can be used) for the duration of the COVID-19 declaration under Section 564(b)(1) of the Act, 21 U.S.C. section 360bbb-3(b)(1), unless the authorization is terminated or revoked.  Performed at [facility], [place] [address], [place], [place] [address]   Gastrointestinal Panel by PCR , Stool     Status: None   Collection Time: 04/08/21  2:00 AM    Specimen: Stool  Result Value Ref Range Status   Campylobacter species NOT DETECTED NOT DETECTED Final   Plesimonas shigelloides NOT DETECTED NOT DETECTED Final   Salmonella species NOT DETECTED NOT DETECTED Final   Yersinia enterocolitica NOT DETECTED NOT DETECTED Final   Vibrio species NOT DETECTED NOT DETECTED Final   Vibrio cholerae NOT DETECTED NOT DETECTED Final   Enteroaggregative E coli (EAEC) NOT DETECTED NOT DETECTED Final   Enteropathogenic E coli (EPEC) NOT DETECTED NOT DETECTED Final   Enterotoxigenic E coli (ETEC) NOT DETECTED NOT DETECTED Final   Shiga like toxin producing E coli (STEC) NOT DETECTED NOT DETECTED Final   Shigella/Enteroinvasive E coli (EIEC) NOT DETECTED NOT DETECTED Final   Cryptosporidium NOT DETECTED NOT DETECTED Final   Cyclospora cayetanensis NOT DETECTED NOT DETECTED Final   Entamoeba histolytica NOT DETECTED NOT DETECTED Final  Giardia lamblia NOT DETECTED NOT DETECTED Final   Adenovirus F40/41 NOT DETECTED NOT DETECTED Final   Astrovirus NOT DETECTED NOT DETECTED Final   Norovirus GI/GII NOT DETECTED NOT DETECTED Final   Rotavirus A NOT DETECTED NOT DETECTED Final   Sapovirus (I, II, IV, and V) NOT DETECTED NOT DETECTED Final    Comment: Performed at [facility], [address] [place], [place] [address]  C Difficile Quick Screen (NO PCR Reflex)     Status: None   Collection Time: 04/11/21  1:48 PM  Result Value Ref Range Status   C Diff antigen NEGATIVE NEGATIVE Final   C Diff toxin NEGATIVE NEGATIVE Final   C Diff interpretation No C. difficile detected.  Final    Comment: Performed at [facility], [place] [address] [place], [place] [address]      Studies: No results found.  Scheduled Meds: . amLODipine  10 mg Oral Daily  . budesonide  3 mg Oral QODAY  . buPROPion  200 mg Oral Daily  . Chlorhexidine Gluconate Cloth  6 each Topical Daily  . cycloSPORINE  1 drop Both Eyes BID  . diphenoxylate-atropine  1  tablet Oral QID  . DULoxetine  90 mg Oral Daily  . hydrALAZINE  75 mg Oral TID  . insulin aspart  0-9 Units Subcutaneous Q6H  . levETIRAcetam  500 mg Oral BID  . loperamide  2 mg Oral QID  . methadone  5 mg Oral 5 X Daily  . metoprolol tartrate  100 mg Oral BID  . pantoprazole  40 mg Oral Daily  . polyvinyl alcohol  1 drop Both Eyes QID  . sodium chloride flush  10-40 mL Intracatheter Q12H  . sodium chloride flush  3 mL Intravenous Q12H  . Teduglutide (rDNA)  1.5 mg Subcutaneous Daily    Continuous Infusions: . lactated ringers    . TPN CYCLIC-ADULT (ION)       LOS: 4 days     Kayleen Memos, MD [facility] Pager [phone]  If 7PM-7AM, please contact night-coverage [email or web address] Password [facility] 04/12/2021, 2:34 PM

## 2021-04-12 NOTE — Progress Notes (Signed)
[facility] Gastroenterology Progress Note  Caitlin Peters 60 y.o. 17-Sep-1961  CC:  Abdominal pain, colitis on CT imaging  Subjective: Patient reports continued abdominal pain.  Reports continued diarrhea every 1-2 hours.  States her appetite is poor.  ROS : Review of Systems  Cardiovascular: Negative for chest pain and palpitations.  Gastrointestinal: Positive for abdominal pain and diarrhea. Negative for blood in stool, constipation, heartburn, melena, nausea and vomiting.   Objective: Vital signs in last 24 hours: Vitals:   04/11/21 2026 04/12/21 0527  BP: (!) 123/59 (!) 146/62  Pulse: 78 79  Resp: 15 15  Temp: 98.3 F (36.8 C) 98.8 F (37.1 C)  SpO2: 100% 100%    Physical Exam:  General:  Alert, cooperative, no distress  Head:  Normocephalic, without obvious abnormality, atraumatic  Eyes:  Anicteric sclera, EOMs intact   Lungs:   Clear to auscultation bilaterally, respirations unlabored  Heart:  Regular rate and rhythm, S1, S2 normal  Abdomen:   Soft, mild diffuse tenderness, bowel sounds active all four quadrants  Extremities: Extremities normal, atraumatic, no  edema    Lab Results: Recent Labs    04/11/21 0824 04/12/21 0422  NA 137 137  K 4.2 4.1  CL 106 108  CO2 27 24  GLUCOSE 119* 133*  BUN 41* 42*  CREATININE 1.17* 1.19*  CALCIUM 8.8* 7.9*  MG 1.9 1.9  PHOS 4.0 3.4   Recent Labs    04/10/21 0605  AST 27  ALT 43  ALKPHOS 85  BILITOT 0.3  PROT 6.1*  ALBUMIN 2.6*   Recent Labs    04/10/21 0605 04/12/21 0422  WBC 4.9 5.0  NEUTROABS 2.4  --   HGB 10.7* 8.8*  HCT 32.7* 27.4*  MCV 91.9 92.9  PLT 210 178   No results for input(s): LABPROT, INR in the last 72 hours.    Assessment: Colitis on CT imaging, nausea/vomiting, diarrhea: Crohn's colitis vs. Infectious colitis. -GI pathogen panel 5/14 negative, C diff 5/17 negative -CRP normal, fecal calprotectin pending -Hgb 8.8   Short bowel syndrome, on Gattex and TPN -Albumin  2.6  History of line infections, recent discitis  Plan: Discussed with Dr. Domenica Fail [facility] IBD) via phone.  Presentation not entirely consistent with Crohn's disease flare, given non-specific colonic thickening without stranding or lymphadenopathy on CT scan, recent normal colonoscopy, and normal CRP.  He believes repeat colonoscopy at this time will be low-yield.  He stated patient has had gastrointestinal symptoms with line infections in the past, thus low threshold for blood cultures, given frequent line infections.  Restart Imodium 4 times daily.    Increase Lomotil to 4 times daily.    Continue Creon, Gattex, and TPN.  Continue supportive care.  [facility] will follow.  Salley Slaughter PA-C 04/12/2021, 11:33 AM  Contact #  [phone]

## 2021-04-12 NOTE — Progress Notes (Signed)
Pt remains c/o abdominal pain, 10/10; request MD be notified for intervention.   New orders received for increase in pain medication dosage. Given pain medication per MAR at 1525.   Removed new dose but only given 0.108m to equal 148mdose ordered.  Pt made aware and verablizes understanding.

## 2021-04-12 NOTE — Care Management Important Message (Signed)
Important Message  Patient Details IM Letter given to the Patient. Name: Caitlin Peters MRN: [id number] Date of Birth: 07-20-1961   Medicare Important Message Given:  Yes     [name] 04/12/2021, 10:58 AM

## 2021-04-12 NOTE — Progress Notes (Signed)
PHARMACY - TOTAL PARENTERAL NUTRITION CONSULT NOTE   Indication: Short bowel syndrome  Patient Measurements: Height: 5' 7"  (170.2 cm) Weight: 63 kg (138 lb 14.2 oz) IBW/kg (Calculated) : 61.6 TPN AdjBW (KG): 61 Body mass index is 21.75 kg/m. 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 Pharmacy to continue TPN while admitted.  Glucose / Insulin: cbgs (goal <150): wnl - sSSI q6h:  1 unit / 24 hrs Electrolytes: Na, K, Mag, Phos, CorrCa 9.02, all WNL.  Cl:CO2 WNL Renal: SCr increasing (0.89 > 1.17 > 1.19), BUN inc to 42 Hepatic: LFTs, Tbili wnl - TG 121 (5/14), 44 (5/16) Prealbumin 26.9 (5/14), inc to 28.6 (5/16) Intake / Output; MIVF: net I/O inaccurate.  Urine x3 and stool x2 occurrences.  Vomiting not documented.    Monitor output closely d/t reports of ongoing N/V/D.  Cdiff testing ordered.  Home Gattex resumed. GI Imaging: - 5/13 abd CT: Distal colitis, which given the clinical history, is most likely due to inflammatory bowel disease. Extensive surgical changes of enterotomy and partial colectomy. No bowel obstruction GI Surgeries / Procedures:   Central access: right femoral PICC TPN start date: on TPN PTA --> continuing inpatient  Nutritional Goals: (per Dietitian on 5/15)  Goals:  Kcal:  1800-2000 kcal, Protein:  90-100 grams, Fluid:  >/= 1.8 L/day  Inpatient Cyclic TPN 8889 mL over 12 hrs provides: 99 g of protein and 1847 kcals per day.    Home TPN formula: 1694 mL cyclic over 12 hours (1 hr taper up/down) at 6pm - Total Kcal: 1824 kcal with lipid on MWF, and 1224 kcal without lipid on TTSS - Protein: 85 gm - dextrose: 260 gm - lipid: 60 gm (contains lipid on MWF only)   Current Nutrition:  TPN, Regular  diet  Plan:  - Continue cyclic TPN:  5038 ml infused over 12 hrs starting at 1800 - Electrolytes in TPN: Na 80 mEq/L, K 93mq/L, Ca 5 mEq/L, Mg 555m/L, and Phos 1527m/L. Ratio 1 Cl:2 Ac - Add standard MVI and trace elements to TPN - Continue Sensitive q6h SSI and adjust as needed (CBGs: 2 hrs past cyclic TPN start + 1 hr past cycle TPN d/c'ed + middle of TPN infusion + while off TPN --> 0000, 0700, 1200, 2000) - Monitor TPN labs on Mon/Thurs.    ChrGretta ArabarmD, BCPS Clinical Pharmacist [facility] main pharmacy [phone] 251918/2022 7:08 AM

## 2021-04-13 DIAGNOSIS — K5080 Crohn's disease of both small and large intestine without complications: Secondary | ICD-10-CM | POA: Diagnosis not present

## 2021-04-13 DIAGNOSIS — E876 Hypokalemia: Secondary | ICD-10-CM | POA: Diagnosis not present

## 2021-04-13 DIAGNOSIS — K529 Noninfective gastroenteritis and colitis, unspecified: Secondary | ICD-10-CM | POA: Diagnosis not present

## 2021-04-13 LAB — COMPREHENSIVE METABOLIC PANEL
ALT: 20 U/L (ref 0–44)
AST: 11 U/L — ABNORMAL LOW (ref 15–41)
Albumin: 2.1 g/dL — ABNORMAL LOW (ref 3.5–5.0)
Alkaline Phosphatase: 73 U/L (ref 38–126)
Anion gap: 2 — ABNORMAL LOW (ref 5–15)
BUN: 42 mg/dL — ABNORMAL HIGH (ref 6–20)
CO2: 24 mmol/L (ref 22–32)
Calcium: 7.8 mg/dL — ABNORMAL LOW (ref 8.9–10.3)
Chloride: 110 mmol/L (ref 98–111)
Creatinine, Ser: 1.26 mg/dL — ABNORMAL HIGH (ref 0.44–1.00)
GFR, Estimated: 49 mL/min — ABNORMAL LOW (ref >60)
Glucose, Bld: 125 mg/dL — ABNORMAL HIGH (ref 70–99)
Potassium: 4.5 mmol/L (ref 3.5–5.1)
Sodium: 136 mmol/L (ref 135–145)
Total Bilirubin: 0.2 mg/dL — ABNORMAL LOW (ref 0.3–1.2)
Total Protein: 5.4 g/dL — ABNORMAL LOW (ref 6.5–8.1)

## 2021-04-13 LAB — CBC
HCT: 27 % — ABNORMAL LOW (ref 36.0–46.0)
Hemoglobin: 8.8 g/dL — ABNORMAL LOW (ref 12.0–15.0)
MCH: 30.7 pg (ref 26.0–34.0)
MCHC: 32.6 g/dL (ref 30.0–36.0)
MCV: 94.1 fL (ref 80.0–100.0)
Platelets: 177 K/uL (ref 150–400)
RBC: 2.87 MIL/uL — ABNORMAL LOW (ref 3.87–5.11)
RDW: 15 % (ref 11.5–15.5)
WBC: 4.3 K/uL (ref 4.0–10.5)
nRBC: 0 % (ref 0.0–0.2)

## 2021-04-13 LAB — GLUCOSE, CAPILLARY
Glucose-Capillary: 106 mg/dL — ABNORMAL HIGH (ref 70–99)
Glucose-Capillary: 104 mg/dL — ABNORMAL HIGH (ref 70–99)
Glucose-Capillary: 103 mg/dL — ABNORMAL HIGH (ref 70–99)
Glucose-Capillary: 110 mg/dL — ABNORMAL HIGH (ref 70–99)

## 2021-04-13 LAB — MAGNESIUM: Magnesium: 2.1 mg/dL (ref 1.7–2.4)

## 2021-04-13 LAB — PHOSPHORUS: Phosphorus: 3.1 mg/dL (ref 2.5–4.6)

## 2021-04-13 LAB — CALPROTECTIN, FECAL: Calprotectin, Fecal: 27 ug/g (ref 0–120)

## 2021-04-13 MED ORDER — DICYCLOMINE HCL 20 MG PO TABS
20.0000 mg | ORAL_TABLET | Freq: Four times a day (QID) | ORAL | Status: DC
  Administered 2021-04-13: 20 mg via ORAL
  Filled 2021-04-13 (×5): qty 1

## 2021-04-13 MED ORDER — NALOXONE HCL 0.4 MG/ML IJ SOLN: 0.4000 mg | INTRAMUSCULAR | Status: DC | PRN

## 2021-04-13 MED ORDER — TRAVASOL 10 % IV SOLN
INTRAVENOUS | Status: AC
  Filled 2021-04-13: qty 990

## 2021-04-13 MED ORDER — HYDROMORPHONE HCL 1 MG/ML PO LIQD
1.0000 mg | Freq: Once | ORAL | Status: AC
Start: 2021-04-13 — End: 2021-04-13
  Administered 2021-04-13: 1 mg via ORAL
  Filled 2021-04-13: qty 1

## 2021-04-13 MED ORDER — ENOXAPARIN SODIUM 40 MG/0.4ML IJ SOSY
40.0000 mg | PREFILLED_SYRINGE | INTRAMUSCULAR | Status: DC
  Filled 2021-04-13 (×2): qty 0.4

## 2021-04-13 NOTE — Progress Notes (Signed)
PHARMACY - TOTAL PARENTERAL NUTRITION CONSULT NOTE   Indication: Short bowel syndrome  Patient Measurements: Height: 5' 7"  (170.2 cm) Weight: 64.5 kg (142 lb 3.2 oz) IBW/kg (Calculated) : 61.6 TPN AdjBW (KG): 61 Body mass index is 22.27 kg/m. 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 Pharmacy to continue TPN while admitted.  Glucose / Insulin: cbgs (goal <150): wnl - sSSI q6h:  1 unit / 24 hrs Electrolytes: Na, K, Mag, Phos, CorrCa 9.3, all WNL.  Cl:CO2 WNL Renal: SCr increasing (0.89 > 1.17 > 1.19 > 1.26), BUN inc to 42 Hepatic: LFTs, Tbili wnl - TG 121 (5/14), 44 (5/16) Prealbumin 26.9 (5/14), inc to 28.6 (5/16) Intake / Output; MIVF: net I/O inaccurate.  Urine and stool multiple unmeasured occurrences.  Vomiting not documented.    Monitor output closely d/t reports of ongoing N/V/D.  Cdiff testing ordered.  Home Gattex resumed. GI Imaging: - 5/13 abd CT: Distal colitis, which given the clinical history, is most likely due to inflammatory bowel disease. Extensive surgical changes of enterotomy and partial colectomy. No bowel obstruction GI Surgeries / Procedures:   Central access: right femoral PICC TPN start date: on TPN PTA --> continuing inpatient  Nutritional Goals: (per Dietitian on 5/15)  Goals:  Kcal:  1800-2000 kcal, Protein:  90-100 grams, Fluid:  >/= 1.8 L/day  Inpatient Cyclic TPN 1610 mL over 12 hrs provides: 99 g of protein and 1847 kcals per day.    Home TPN formula: 9604 mL cyclic over 12 hours (1 hr taper up/down) at 6pm - Total Kcal: 1824 kcal with lipid on MWF, and 1224 kcal without lipid on TTSS - Protein: 85 gm - dextrose: 260 gm - lipid: 60 gm (contains lipid on MWF only)   Current Nutrition:   TPN, Regular diet  Plan:  - Continue cyclic TPN:  5409 ml infused over 12 hrs starting at 1800 - Electrolytes in TPN: Na 80 mEq/L, K 20mq/L, Ca 5 mEq/L, Mg 572m/L, and Phos 1562m/L. Ratio 1 Cl:2 Ac - Add standard MVI and trace elements to TPN - Continue Sensitive q6h SSI and adjust as needed (CBGs: 2 hrs past cyclic TPN start + 1 hr past cycle TPN d/c'ed + middle of TPN infusion + while off TPN --> 0000, 0700, 1200, 2000) - Monitor TPN labs on Mon/Thurs.   BMET, Mg, and Phos AM  ChrUlice Dash 04/13/2021 8:01 AM

## 2021-04-13 NOTE — Progress Notes (Addendum)
[facility] Progress Note  Caitlin Peters 60 y.o. 1961-06-06  CC:  Abdominal pain, colitis on CT imaging  Subjective: Patient reports continued abdominal pain.  Reports continued diarrhea and poor appetite.  Discussed with NT, pt has had 1-2 stools thus far today.  ROS : Review of Systems  Cardiovascular: Negative for chest pain and palpitations.  Gastrointestinal: Positive for abdominal pain and diarrhea. Negative for blood in stool, constipation, heartburn, melena, nausea and vomiting.   Objective: Vital signs in last 24 hours: Vitals:   04/12/21 2038 04/13/21 0501  BP: 126/61 (!) 142/59  Pulse: (!) 58 84  Resp: 18 20  Temp: 98.1 F (36.7 C) 98.4 F (36.9 C)  SpO2: 100% 97%    Physical Exam:  General:  Alert, cooperative, no distress  Head:  Normocephalic, without obvious abnormality, atraumatic  Eyes:  Anicteric sclera, EOMs intact   Lungs:   Clear to auscultation bilaterally, respirations unlabored  Heart:  Regular rate and rhythm, S1, S2 normal  Abdomen:   Soft, mild lower abdominal tenderness, bowel sounds active all four quadrants  Extremities: Extremities normal, atraumatic, no  edema    Lab Results: Recent Labs    04/12/21 0422 04/13/21 0414  NA 137 136  K 4.1 4.5  CL 108 110  CO2 24 24  GLUCOSE 133* 125*  BUN 42* 42*  CREATININE 1.19* 1.26*  CALCIUM 7.9* 7.8*  MG 1.9 2.1  PHOS 3.4 3.1   Recent Labs    04/13/21 0414  AST 11*  ALT 20  ALKPHOS 73  BILITOT 0.2*  PROT 5.4*  ALBUMIN 2.1*   Recent Labs    04/12/21 0422  WBC 5.0  HGB 8.8*  HCT 27.4*  MCV 92.9  PLT 178   No results for input(s): LABPROT, INR in the last 72 hours.    Assessment: Colitis on CT imaging, possibly related to hypoalbuminemia (albumin 2.1).  Acute on chronic diarrhea: -GI pathogen panel 5/14 negative, C diff 5/17 negative -CRP normal, fecal calprotectin normal making IBD flare less likely -Hgb 8.8    Short bowel syndrome, on Gattex and  TPN -Albumin 2.1   History of line infections, recent discitis   Plan: Continue Imodium and Lomotil 4 times daily.    Continue Creon, Gattex, and TPN.  Continue supportive care.  Start Bentyl QID.  Follow up with Dr. Domenica Fail at [facility] after discharge.  [facility] will sign off.  Please contact us if we can be of any other assistance during this hospital stay.   Salley Slaughter PA-C 04/13/2021, 10:11 AM  Contact #  [phone]

## 2021-04-13 NOTE — Progress Notes (Signed)
PROGRESS NOTE  [name]:[id number] DOB: 07/04/1961 DOA: 04/07/2021 PCP: [name], [name], MD  HPI/Recap of past 24 hours:  Caitlin Peters is a 60 y.o. F with Crohn's disease, multiple bowel resections in [place], on Budesonide followed by [facility] Dr. Domenica Fail, now with short gut syndrome, TPN dependent, output managed with Gattex, chronic diffuse pain on methadone and PO Dilaudid, and moderate protein calorie malnutrition who presented with anorexia, vomiting/nausea, foul stools and severe abdominal pain for about 1 week.  Work-up revealed colitis seen on CT.  Also has short-bowel syndrome.  GI was consulted and following.  C. difficile PCR and GI panel negative.  04/13/21: Reports persistent abdominal cramping and diarrhea.  GI titrating antidiarrhea medication.  Pain management in place with methadone 5 mg twice daily and IV Dilaudid as needed for severe pain.   Assessment/Plan: Principal Problem:   Colitis Active Problems:   Hypokalemia   Chronic diarrhea   Crohn's disease of small & large intestine with SGS   GERD (gastroesophageal reflux disease)   Chronic pain syndrome   Anxiety   Anemia   Methadone dependence ([name])   CKD (chronic kidney disease) stage 3, GFR 30-59 ml/min ([name])   [name]   Acidosis  Resolved hypokalemia, hypophosphatemia, hypomagnesemia, non-anion gap metabolic acidosis due to diarrhea Continue TPN and IV fluid hydration Appreciate pharmacy's assistance.  Persistent diarrhea and abdominal pain/cramps Distal colitis seen on CT scan Management per GI. Maxed out opiate anti-motility agents and Lomotil.  On home Gattex but still painful loose stools.  GI panel negative, C. difficile PCR negative.  Continue supportive care IV fluid TPN Antimotility agents Antiemetics as needed Bentyl added 20 mg 4 times daily for abdominal cramps.  Anemia of chronic disease Drop in hemoglobin this morning 8.8 from 10.7 Monitor H&H No overt bleeding at  this time  Crohn's disease Short gut syndrome -Continue budesonide -Continue pancreas enzymes -Continue TPN  Chronic pain syndrome Mood disorder -Continue methadone -IV Dilaudid as needed for severe pain -Continue duloxetine, bupropion  Hypertension BP stable. 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 Fall precaution     Disposition: Status is: Inpatient  Remains inpatient appropriate because:Ongoing active pain requiring inpatient pain management   Dispo: The patient is from: Home  Anticipated d/c is to: Home when GI signs OFF  Patient currently is not medically stable to d/c.              Difficult to place patient No    DVT prophylaxis: Place and maintain sequential compression device Start: 04/09/21 0917, subcu Lovenox 04/13/2021.  Code Status: FULL Family Communication:     Objective: Vitals:   04/12/21 2038 04/13/21 0500 04/13/21 0501 04/13/21 1104  BP: 126/61  (!) 142/59   Pulse: (!) 58  84 85  Resp: 18  20   Temp: 98.1 F (36.7 C)  98.4 F (36.9 C)   TempSrc: Oral  Oral   SpO2: 100%  97%   Weight:  64.5 kg    Height:        Intake/Output Summary (Last 24 hours) at 04/13/2021 1210 Last data filed at 04/13/2021 0200 Gross per 24 hour  Intake 2277.65 ml  Output --  Net 2277.65 ml   Filed Weights   04/10/21 0539 04/12/21 0500 04/13/21 0500  Weight: 63.5 kg 63 kg 64.5 kg    Exam:  . General: 60 y.o. year-old female frail-appearing no acute distress.  She is alert oriented x3.  Appears uncomfortable due to abdominal cramps. . Cardiovascular: Regular rate and rhythm no rubs gallops.   [name] Kitchen Respiratory: Clear to auscultation no wheezes or rales. . Abdomen:  Diffuse tenderness with palpation.  Bowel sounds present.   . Musculoskeletal: No lower extremity edema bilaterally. . Skin: No ulcerative lesions noted.   [name] Kitchen Psychiatry: Mood is appropriate for condition and setting.   Data Reviewed: CBC: Recent Labs  Lab 04/06/21 1637 04/07/21 1424 04/08/21 0500 04/10/21 0605 04/12/21 0422  WBC 4.2 4.8 3.2* 4.9 5.0  NEUTROABS 1.9 3.1 1.5* 2.4  --   HGB 10.4* 10.9* 9.8* 10.7* 8.8*  HCT 32.6* 33.0* 30.9* 32.7* 27.4*  MCV 91.8 91.7 94.2 91.9 92.9  PLT 213 215 192 210 656   Basic Metabolic Panel: Recent Labs  Lab 04/09/21 0345 04/10/21 0605 04/11/21 0824 04/12/21 0422 04/13/21 0414  NA 139 137 137 137 136  K 4.1 4.2 4.2 4.1 4.5  CL 113* 106 106 108 110  CO2 24 24 27 24 24   GLUCOSE 127* 115* 119* 133* 125*  BUN 19 32* 41* 42* 42*  CREATININE 0.80 0.89 1.17* 1.19* 1.26*  CALCIUM 7.0* 8.4* 8.8* 7.9* 7.8*  MG 1.8 2.0 1.9 1.9 2.1  PHOS 2.9 3.6 4.0 3.4 3.1   GFR: Estimated Creatinine Clearance: 46.2 mL/min (A) (by C-G formula based on SCr of 1.26 mg/dL (H)). Liver Function Tests: Recent Labs  Lab 04/06/21 1637 04/07/21 1424 04/08/21 0500 04/09/21 0345 04/10/21 0605 04/13/21 0414  AST 18 26 29   --  27 11*  ALT 23 29 34  --  43 20  ALKPHOS 81 88 78  --  85 73  BILITOT 0.4 <0.1* 0.5  --  0.3 0.2*  PROT 6.4* 6.4* 5.9*  --  6.1* 5.4*  ALBUMIN 2.7* 2.8* 2.6* 2.3* 2.6* 2.1*   Recent Labs  Lab 04/06/21 1637 04/07/21 1424  LIPASE 31 37   No results for input(s): AMMONIA in the last 168 hours. Coagulation Profile: No results for input(s): INR, PROTIME in the last 168 hours. Cardiac Enzymes: No results for input(s): CKTOTAL, CKMB, CKMBINDEX, TROPONINI in the last 168 hours. BNP (last 3 results) No results for input(s): PROBNP in the last 8760 hours. HbA1C: No results for input(s): HGBA1C in the last 72 hours. CBG: Recent Labs  Lab 04/12/21 2041 04/12/21 2357 04/13/21 0655 04/13/21 0750 04/13/21 1127  GLUCAP 117* 131* 106*  104* 103*   Lipid Profile: No results for input(s): CHOL, HDL, LDLCALC, TRIG, CHOLHDL, LDLDIRECT in the last 72 hours. Thyroid Function Tests: No results for input(s): TSH, T4TOTAL, FREET4, T3FREE, THYROIDAB in the last 72 hours. Anemia Panel: No results for input(s): VITAMINB12, FOLATE, FERRITIN, TIBC, IRON, RETICCTPCT in the last 72 hours. Urine analysis:    Component Value Date/Time   COLORURINE YELLOW 04/06/2021 1637   APPEARANCEUR HAZY (A) 04/06/2021 1637   LABSPEC 1.010 04/06/2021 1637   PHURINE 7.0 04/06/2021 1637   GLUCOSEU NEGATIVE 04/06/2021 1637   [place] 04/06/2021 [place] 04/06/2021 [place] 04/06/2021 1637   PROTEINUR >=300 (A) 04/06/2021 1637   NITRITE NEGATIVE 04/06/2021 1637   LEUKOCYTESUR SMALL (A) 04/06/2021 1637   Sepsis Labs: @LABRCNTIP (procalcitonin:4,lacticidven:4)  ) Recent Results (from the past 240 hour(s))  Resp Panel by RT-PCR (Flu A&B, Covid) Nasopharyngeal Swab  Status: None   Collection Time: 04/07/21  5:58 PM   Specimen: Nasopharyngeal Swab; Nasopharyngeal(NP) swabs in vial transport medium  Result Value Ref Range Status   SARS Coronavirus 2 by RT PCR NEGATIVE NEGATIVE Final    Comment: (NOTE) SARS-CoV-2 target nucleic acids are NOT DETECTED.  The SARS-CoV-2 RNA is generally detectable in upper respiratory specimens during the acute phase of infection. The lowest concentration of SARS-CoV-2 viral copies this assay can detect is 138 copies/mL. A negative result does not preclude SARS-Cov-2 infection and should not be used as the sole basis for treatment or other patient management decisions. A negative result may occur with  improper specimen collection/handling, submission of specimen other than nasopharyngeal swab, presence of viral mutation(s) within the areas targeted by this assay, and inadequate number of viral copies(<138 copies/mL). A negative result must be combined with clinical  observations, patient history, and epidemiological information. The expected result is Negative.  Fact Sheet for Patients:  EntrepreneurPulse.com.au  Fact Sheet for Healthcare Providers:  IncredibleEmployment.be  This test is no t yet approved or cleared by the [place] FDA and  has been authorized for detection and/or diagnosis of SARS-CoV-2 by FDA under an Emergency Use Authorization (EUA). This EUA will remain  in effect (meaning this test can be used) for the duration of the COVID-19 declaration under Section 564(b)(1) of the Act, 21 U.S.C.section 360bbb-3(b)(1), unless the authorization is terminated  or revoked sooner.       Influenza A by PCR NEGATIVE NEGATIVE Final   Influenza B by PCR NEGATIVE NEGATIVE Final    Comment: (NOTE) The Xpert Xpress SARS-CoV-2/FLU/RSV plus assay is intended as an aid in the diagnosis of influenza from Nasopharyngeal swab specimens and should not be used as a sole basis for treatment. Nasal washings and aspirates are unacceptable for Xpert Xpress SARS-CoV-2/FLU/RSV testing.  Fact Sheet for Patients: EntrepreneurPulse.com.au  Fact Sheet for Healthcare Providers: IncredibleEmployment.be  This test is not yet approved or cleared by the [place] FDA and has been authorized for detection and/or diagnosis of SARS-CoV-2 by FDA under an Emergency Use Authorization (EUA). This EUA will remain in effect (meaning this test can be used) for the duration of the COVID-19 declaration under Section 564(b)(1) of the Act, 21 U.S.C. section 360bbb-3(b)(1), unless the authorization is terminated or revoked.  Performed at [facility], [place] [address], [place], [place] [address]   Gastrointestinal Panel by PCR , Stool     Status: None   Collection Time: 04/08/21  2:00 AM   Specimen: Stool  Result Value Ref Range Status   Campylobacter species NOT DETECTED  NOT DETECTED Final   Plesimonas shigelloides NOT DETECTED NOT DETECTED Final   Salmonella species NOT DETECTED NOT DETECTED Final   Yersinia enterocolitica NOT DETECTED NOT DETECTED Final   Vibrio species NOT DETECTED NOT DETECTED Final   Vibrio cholerae NOT DETECTED NOT DETECTED Final   Enteroaggregative E coli (EAEC) NOT DETECTED NOT DETECTED Final   Enteropathogenic E coli (EPEC) NOT DETECTED NOT DETECTED Final   Enterotoxigenic E coli (ETEC) NOT DETECTED NOT DETECTED Final   Shiga like toxin producing E coli (STEC) NOT DETECTED NOT DETECTED Final   Shigella/Enteroinvasive E coli (EIEC) NOT DETECTED NOT DETECTED Final   Cryptosporidium NOT DETECTED NOT DETECTED Final   Cyclospora cayetanensis NOT DETECTED NOT DETECTED Final   Entamoeba histolytica NOT DETECTED NOT DETECTED Final   Giardia lamblia NOT DETECTED NOT DETECTED Final   Adenovirus F40/41 NOT DETECTED NOT DETECTED Final  Astrovirus NOT DETECTED NOT DETECTED Final   Norovirus GI/GII NOT DETECTED NOT DETECTED Final   Rotavirus A NOT DETECTED NOT DETECTED Final   Sapovirus (I, II, IV, and V) NOT DETECTED NOT DETECTED Final    Comment: Performed at [facility], [address] [place], [place] [address]  Calprotectin, Fecal     Status: None   Collection Time: 04/10/21  1:52 PM   Specimen: Stool  Result Value Ref Range Status   Calprotectin, Fecal 27 0 - 120 ug/g Final    Comment: (NOTE) Concentration     Interpretation   Follow-Up <16 - 50 ug/g     Normal           None >50 -120 ug/g     Borderline       Re-evaluate in 4-6 weeks    >120 ug/g     Abnormal         Repeat as clinically                                   indicated Performed At: [facility] [place], [place] [id number] Rush Farmer MD WC:[id number]   C Difficile Quick Screen (NO PCR Reflex)     Status: None   Collection Time: 04/11/21  1:48 PM  Result Value Ref Range Status   C Diff antigen NEGATIVE NEGATIVE Final   C  Diff toxin NEGATIVE NEGATIVE Final   C Diff interpretation No C. difficile detected.  Final    Comment: Performed at [facility], [place] [address], [place], [place] [address]      Studies: No results found.  Scheduled Meds: . amLODipine  10 mg Oral Daily  . budesonide  3 mg Oral QODAY  . buPROPion  200 mg Oral Daily  . Chlorhexidine Gluconate Cloth  6 each Topical Daily  . cycloSPORINE  1 drop Both Eyes BID  . dicyclomine  20 mg Oral QID  . diphenoxylate-atropine  1 tablet Oral QID  . DULoxetine  90 mg Oral Daily  . hydrALAZINE  75 mg Oral TID  . insulin aspart  0-9 Units Subcutaneous Q6H  . levETIRAcetam  500 mg Oral BID  . loperamide  2 mg Oral QID  . methadone  5 mg Oral 5 X Daily  . metoprolol tartrate  100 mg Oral BID  . pantoprazole  40 mg Oral Daily  . polyvinyl alcohol  1 drop Both Eyes QID  . sodium chloride flush  10-40 mL Intracatheter Q12H  . sodium chloride flush  3 mL Intravenous Q12H  . Teduglutide (rDNA)  1.5 mg Subcutaneous Daily    Continuous Infusions: . lactated ringers 75 mL/hr at 04/13/21 0429  . TPN CYCLIC-ADULT (ION)       LOS: 5 days     Kayleen Memos, MD [facility] Pager [phone]  If 7PM-7AM, please contact night-coverage [email or web address] Password [email or web address] 04/13/2021, 12:10 PM

## 2021-04-13 NOTE — Plan of Care (Signed)
Plan of care discussed with pt.

## 2021-04-14 DIAGNOSIS — Z7189 Other specified counseling: Secondary | ICD-10-CM

## 2021-04-14 DIAGNOSIS — G894 Chronic pain syndrome: Secondary | ICD-10-CM | POA: Diagnosis not present

## 2021-04-14 DIAGNOSIS — E876 Hypokalemia: Secondary | ICD-10-CM | POA: Diagnosis not present

## 2021-04-14 DIAGNOSIS — K5080 Crohn's disease of both small and large intestine without complications: Secondary | ICD-10-CM | POA: Diagnosis not present

## 2021-04-14 DIAGNOSIS — K50819 Crohn's disease of both small and large intestine with unspecified complications: Secondary | ICD-10-CM | POA: Diagnosis not present

## 2021-04-14 DIAGNOSIS — K529 Noninfective gastroenteritis and colitis, unspecified: Secondary | ICD-10-CM | POA: Diagnosis not present

## 2021-04-14 DIAGNOSIS — Z515 Encounter for palliative care: Secondary | ICD-10-CM

## 2021-04-14 LAB — CBC
HCT: 27.6 % — ABNORMAL LOW (ref 36.0–46.0)
Hemoglobin: 8.9 g/dL — ABNORMAL LOW (ref 12.0–15.0)
MCH: 30.5 pg (ref 26.0–34.0)
MCHC: 32.2 g/dL (ref 30.0–36.0)
MCV: 94.5 fL (ref 80.0–100.0)
Platelets: 185 10*3/uL (ref 150–400)
RBC: 2.92 MIL/uL — ABNORMAL LOW (ref 3.87–5.11)
RDW: 15.1 % (ref 11.5–15.5)
WBC: 4 10*3/uL (ref 4.0–10.5)
nRBC: 0 % (ref 0.0–0.2)

## 2021-04-14 LAB — GLUCOSE, CAPILLARY
Glucose-Capillary: 106 mg/dL — ABNORMAL HIGH (ref 70–99)
Glucose-Capillary: 109 mg/dL — ABNORMAL HIGH (ref 70–99)
Glucose-Capillary: 116 mg/dL — ABNORMAL HIGH (ref 70–99)
Glucose-Capillary: 90 mg/dL (ref 70–99)
Glucose-Capillary: 93 mg/dL (ref 70–99)

## 2021-04-14 LAB — BASIC METABOLIC PANEL
Anion gap: 3 — ABNORMAL LOW (ref 5–15)
BUN: 37 mg/dL — ABNORMAL HIGH (ref 6–20)
CO2: 23 mmol/L (ref 22–32)
Calcium: 7.5 mg/dL — ABNORMAL LOW (ref 8.9–10.3)
Chloride: 112 mmol/L — ABNORMAL HIGH (ref 98–111)
Creatinine, Ser: 1.02 mg/dL — ABNORMAL HIGH (ref 0.44–1.00)
GFR, Estimated: >60 mL/min (ref >60)
Glucose, Bld: 109 mg/dL — ABNORMAL HIGH (ref 70–99)
Potassium: 4.9 mmol/L (ref 3.5–5.1)
Sodium: 138 mmol/L (ref 135–145)

## 2021-04-14 LAB — MAGNESIUM: Magnesium: 2 mg/dL (ref 1.7–2.4)

## 2021-04-14 LAB — PHOSPHORUS: Phosphorus: 2.7 mg/dL (ref 2.5–4.6)

## 2021-04-14 MED ORDER — HYDROMORPHONE HCL 2 MG PO TABS
4.0000 mg | ORAL_TABLET | Freq: Four times a day (QID) | ORAL | Status: DC | PRN
Start: 2021-04-14 — End: 2021-04-14
  Administered 2021-04-14: 4 mg via ORAL
  Filled 2021-04-14: qty 2

## 2021-04-14 MED ORDER — HYDROMORPHONE HCL 1 MG/ML IJ SOLN
1.0000 mg | INTRAMUSCULAR | Status: DC | PRN
  Administered 2021-04-14 (×3): 1 mg via INTRAVENOUS
  Filled 2021-04-14 (×3): qty 1

## 2021-04-14 MED ORDER — HYDROMORPHONE HCL 2 MG PO TABS
4.0000 mg | ORAL_TABLET | Freq: Four times a day (QID) | ORAL | Status: DC | PRN
Start: 2021-04-15 — End: 2021-04-15
  Administered 2021-04-14 – 2021-04-15 (×3): 4 mg via ORAL
  Filled 2021-04-14 (×3): qty 2

## 2021-04-14 MED ORDER — TRAVASOL 10 % IV SOLN
INTRAVENOUS | Status: AC
  Filled 2021-04-14: qty 990

## 2021-04-14 NOTE — Progress Notes (Signed)
PROGRESS NOTE  Caitlin Peters BLT:[id number] DOB: 1961-01-14 DOA: 04/07/2021 PCP: [name], [name], MD  HPI/Recap of past 24 hours:  Caitlin Peters is a 60 y.o. F with Crohn's disease, multiple bowel resections in [place], on Budesonide followed by [facility] Dr. Domenica Fail, now with short gut syndrome, TPN dependent, output managed with Gattex, chronic diffuse pain on methadone and PO Dilaudid, and moderate protein calorie malnutrition who presented with anorexia, vomiting/nausea, foul stools and severe abdominal pain for about 1 week.  Work-up revealed colitis seen on CT.  Also has short-bowel syndrome.  GI was consulted and following.  C. difficile PCR and GI panel negative.  04/13/21: Reports persistent abdominal cramping and diarrhea.  GI titrating antidiarrhea medication.  Pain management in place with methadone 5 mg twice daily and IV Dilaudid as needed for severe pain.  04/14/21:  Reports persistent abdominal pain and nausea.  Requests IV pain medications for severe abdominal pain.   Assessment/Plan: Principal Problem:   Colitis Active Problems:   Hypokalemia   Chronic diarrhea   Crohn's disease of small & large intestine with SGS   GERD (gastroesophageal reflux disease)   Chronic pain syndrome   Anxiety   Anemia   Methadone dependence ([name])   CKD (chronic kidney disease) stage 3, GFR 30-59 ml/min ([name])   [name]   Acidosis  Resolved hypokalemia, hypophosphatemia, hypomagnesemia, non-anion gap metabolic acidosis due to diarrhea Continue TPN and IV fluid hydration Appreciate pharmacy's assistance.  Persistent diarrhea and abdominal pain/cramps Distal colitis seen on CT scan Management per GI. Maxed out opiate anti-motility agents and Lomotil.  On home Gattex but still painful loose stools.  GI panel negative, C. difficile PCR negative.  Continue supportive care IV fluid TPN Antimotility agents Antiemetics as needed Bentyl added 20 mg 4 times daily for abdominal  cramps.  Anemia of chronic disease Drop in hemoglobin this morning 8.8 from 10.7 Monitor H&H No overt bleeding at this time  Crohn's disease Short gut syndrome -Continue budesonide -Continue pancreas enzymes -Continue TPN  Chronic pain syndrome Mood disorder -Continue methadone -IV Dilaudid as needed for severe pain -Continue duloxetine, bupropion  Hypertension BP stable. 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 Fall precaution     Disposition: Status is: Inpatient  Remains inpatient appropriate because:Ongoing active pain requiring inpatient pain management   Dispo: The patient is from: Home  Anticipated d/c is to: Home once symptomatology has improved.  Patient currently is not medically stable to d/c.              Difficult to place patient No    DVT prophylaxis: Place and maintain sequential compression device Start: 04/09/21 0917, subcu Lovenox 04/13/2021.  Code Status: FULL Family Communication:     Objective: Vitals:   04/13/21 1653 04/13/21 2049 04/14/21 0619 04/14/21 1331  BP: 131/67  (!) 157/69 139/72  Pulse: 67 70 84 67  Resp: 16 18 17 12   Temp:  98.6 F (37 C) 98.5 F (36.9 C) 98.1 F (36.7 C)  TempSrc:  Oral Oral   SpO2:  99% 100%   Weight:   65.2 kg   Height:        Intake/Output Summary (Last 24 hours) at 04/14/2021 1838 Last data filed at 04/14/2021 1500 Gross per 24 hour  Intake [phone]  ml  Output --  Net 4399.22 ml   Filed Weights   04/12/21 0500 04/13/21 0500 04/14/21 0619  Weight: 63 kg 64.5 kg 65.2 kg    Exam: No significant changes from prior exam.  . General: 60 y.o. year-old female frail-appearing no acute distress.  She is alert  oriented x3.  Appears uncomfortable due to abdominal cramps. . Cardiovascular: Regular rate and rhythm no rubs gallops.   [name] Kitchen Respiratory: Clear to auscultation no wheezes or rales. . Abdomen: Diffuse tenderness with palpation.  Bowel sounds present.   . Musculoskeletal: No lower extremity edema bilaterally. . Skin: No ulcerative lesions noted.   [name] Kitchen Psychiatry: Mood is appropriate for condition and setting.   Data Reviewed: CBC: Recent Labs  Lab 04/08/21 0500 04/10/21 0605 04/12/21 0422 04/13/21 1229 04/14/21 0608  WBC 3.2* 4.9 5.0 4.3 4.0  NEUTROABS 1.5* 2.4  --   --   --   HGB 9.8* 10.7* 8.8* 8.8* 8.9*  HCT 30.9* 32.7* 27.4* 27.0* 27.6*  MCV 94.2 91.9 92.9 94.1 94.5  PLT 192 210 178 177 338   Basic Metabolic Panel: Recent Labs  Lab 04/10/21 0605 04/11/21 0824 04/12/21 0422 04/13/21 0414 04/14/21 0608  NA 137 137 137 136 138  K 4.2 4.2 4.1 4.5 4.9  CL 106 106 108 110 112*  CO2 24 27 24 24 23   GLUCOSE 115* 119* 133* 125* 109*  BUN 32* 41* 42* 42* 37*  CREATININE 0.89 1.17* 1.19* 1.26* 1.02*  CALCIUM 8.4* 8.8* 7.9* 7.8* 7.5*  MG 2.0 1.9 1.9 2.1 2.0  PHOS 3.6 4.0 3.4 3.1 2.7   GFR: Estimated Creatinine Clearance: 57 mL/min (A) (by C-G formula based on SCr of 1.02 mg/dL (H)). Liver Function Tests: Recent Labs  Lab 04/08/21 0500 04/09/21 0345 04/10/21 0605 04/13/21 0414  AST 29  --  27 11*  ALT 34  --  43 20  ALKPHOS 78  --  85 73  BILITOT 0.5  --  0.3 0.2*  PROT 5.9*  --  6.1* 5.4*  ALBUMIN 2.6* 2.3* 2.6* 2.1*   No results for input(s): LIPASE, AMYLASE in the last 168 hours. No results for input(s): AMMONIA in the last 168 hours. Coagulation Profile: No results for input(s): INR, PROTIME in the last 168 hours. Cardiac Enzymes: No results for input(s): CKTOTAL, CKMB, CKMBINDEX, TROPONINI in the last 168 hours. BNP (last 3 results) No results for input(s): PROBNP in the last 8760 hours. HbA1C: No results for input(s): HGBA1C in the last 72  hours. CBG: Recent Labs  Lab 04/13/21 1127 04/13/21 2052 04/14/21 0744 04/14/21 1124 04/14/21 1624  GLUCAP 103* 110* 106* 90 93   Lipid Profile: No results for input(s): CHOL, HDL, LDLCALC, TRIG, CHOLHDL, LDLDIRECT in the last 72 hours. Thyroid Function Tests: No results for input(s): TSH, T4TOTAL, FREET4, T3FREE, THYROIDAB in the last 72 hours. Anemia Panel: No results for input(s): VITAMINB12, FOLATE, FERRITIN, TIBC, IRON, RETICCTPCT in the last 72 hours. Urine analysis:    Component Value Date/Time   COLORURINE YELLOW 04/06/2021 1637   APPEARANCEUR HAZY (A) 04/06/2021 1637   LABSPEC 1.010 04/06/2021 1637   PHURINE 7.0 04/06/2021 1637   GLUCOSEU NEGATIVE 04/06/2021 1637   [place] 04/06/2021 [place] 04/06/2021 [place] 04/06/2021 1637   PROTEINUR >=300 (A) 04/06/2021 1637   NITRITE NEGATIVE 04/06/2021 1637   LEUKOCYTESUR SMALL (A) 04/06/2021 1637   Sepsis Labs: @LABRCNTIP (procalcitonin:4,lacticidven:4)  ) Recent Results (from the past 240 hour(s))  Resp  Panel by RT-PCR (Flu A&B, Covid) Nasopharyngeal Swab     Status: None   Collection Time: 04/07/21  5:58 PM   Specimen: Nasopharyngeal Swab; Nasopharyngeal(NP) swabs in vial transport medium  Result Value Ref Range Status   SARS Coronavirus 2 by RT PCR NEGATIVE NEGATIVE Final    Comment: (NOTE) SARS-CoV-2 target nucleic acids are NOT DETECTED.  The SARS-CoV-2 RNA is generally detectable in upper respiratory specimens during the acute phase of infection. The lowest concentration of SARS-CoV-2 viral copies this assay can detect is 138 copies/mL. A negative result does not preclude SARS-Cov-2 infection and should not be used as the sole basis for treatment or other patient management decisions. A negative result may occur with  improper specimen collection/handling, submission of specimen other than nasopharyngeal swab, presence of viral mutation(s) within the areas targeted  by this assay, and inadequate number of viral copies(<138 copies/mL). A negative result must be combined with clinical observations, patient history, and epidemiological information. The expected result is Negative.  Fact Sheet for Patients:  EntrepreneurPulse.com.au  Fact Sheet for Healthcare Providers:  IncredibleEmployment.be  This test is no t yet approved or cleared by the [place] FDA and  has been authorized for detection and/or diagnosis of SARS-CoV-2 by FDA under an Emergency Use Authorization (EUA). This EUA will remain  in effect (meaning this test can be used) for the duration of the COVID-19 declaration under Section 564(b)(1) of the Act, 21 U.S.C.section 360bbb-3(b)(1), unless the authorization is terminated  or revoked sooner.       Influenza A by PCR NEGATIVE NEGATIVE Final   Influenza B by PCR NEGATIVE NEGATIVE Final    Comment: (NOTE) The Xpert Xpress SARS-CoV-2/FLU/RSV plus assay is intended as an aid in the diagnosis of influenza from Nasopharyngeal swab specimens and should not be used as a sole basis for treatment. Nasal washings and aspirates are unacceptable for Xpert Xpress SARS-CoV-2/FLU/RSV testing.  Fact Sheet for Patients: EntrepreneurPulse.com.au  Fact Sheet for Healthcare Providers: IncredibleEmployment.be  This test is not yet approved or cleared by the [place] FDA and has been authorized for detection and/or diagnosis of SARS-CoV-2 by FDA under an Emergency Use Authorization (EUA). This EUA will remain in effect (meaning this test can be used) for the duration of the COVID-19 declaration under Section 564(b)(1) of the Act, 21 U.S.C. section 360bbb-3(b)(1), unless the authorization is terminated or revoked.  Performed at [facility], [place] [address], [place], [place] [address]   Gastrointestinal Panel by PCR , Stool     Status: None    Collection Time: 04/08/21  2:00 AM   Specimen: Stool  Result Value Ref Range Status   Campylobacter species NOT DETECTED NOT DETECTED Final   Plesimonas shigelloides NOT DETECTED NOT DETECTED Final   Salmonella species NOT DETECTED NOT DETECTED Final   Yersinia enterocolitica NOT DETECTED NOT DETECTED Final   Vibrio species NOT DETECTED NOT DETECTED Final   Vibrio cholerae NOT DETECTED NOT DETECTED Final   Enteroaggregative E coli (EAEC) NOT DETECTED NOT DETECTED Final   Enteropathogenic E coli (EPEC) NOT DETECTED NOT DETECTED Final   Enterotoxigenic E coli (ETEC) NOT DETECTED NOT DETECTED Final   Shiga like toxin producing E coli (STEC) NOT DETECTED NOT DETECTED Final   Shigella/Enteroinvasive E coli (EIEC) NOT DETECTED NOT DETECTED Final   Cryptosporidium NOT DETECTED NOT DETECTED Final   Cyclospora cayetanensis NOT DETECTED NOT DETECTED Final   Entamoeba histolytica NOT DETECTED NOT DETECTED Final   Giardia lamblia NOT DETECTED  NOT DETECTED Final   Adenovirus F40/41 NOT DETECTED NOT DETECTED Final   Astrovirus NOT DETECTED NOT DETECTED Final   Norovirus GI/GII NOT DETECTED NOT DETECTED Final   Rotavirus A NOT DETECTED NOT DETECTED Final   Sapovirus (I, II, IV, and V) NOT DETECTED NOT DETECTED Final    Comment: Performed at [facility], [address] [place], [place] [address]  Calprotectin, Fecal     Status: None   Collection Time: 04/10/21  1:52 PM   Specimen: Stool  Result Value Ref Range Status   Calprotectin, Fecal 27 0 - 120 ug/g Final    Comment: (NOTE) Concentration     Interpretation   Follow-Up <16 - 50 ug/g     Normal           None >50 -120 ug/g     Borderline       Re-evaluate in 4-6 weeks    >120 ug/g     Abnormal         Repeat as clinically                                   indicated Performed At: [facility] [place], [place] [id number] Rush Farmer MD PH:[id number]   C Difficile Quick Screen (NO PCR Reflex)     Status:  None   Collection Time: 04/11/21  1:48 PM  Result Value Ref Range Status   C Diff antigen NEGATIVE NEGATIVE Final   C Diff toxin NEGATIVE NEGATIVE Final   C Diff interpretation No C. difficile detected.  Final    Comment: Performed at [facility], [place] [address], [place], [place] [address]      Studies: No results found.  Scheduled Meds: . amLODipine  10 mg Oral Daily  . budesonide  3 mg Oral QODAY  . buPROPion  200 mg Oral Daily  . Chlorhexidine Gluconate Cloth  6 each Topical Daily  . cycloSPORINE  1 drop Both Eyes BID  . dicyclomine  20 mg Oral QID  . diphenoxylate-atropine  1 tablet Oral QID  . DULoxetine  90 mg Oral Daily  . enoxaparin (LOVENOX) injection  40 mg Subcutaneous Q24H  . hydrALAZINE  75 mg Oral TID  . insulin aspart  0-9 Units Subcutaneous Q6H  . levETIRAcetam  500 mg Oral BID  . loperamide  2 mg Oral QID  . methadone  5 mg Oral 5 X Daily  . metoprolol tartrate  100 mg Oral BID  . pantoprazole  40 mg Oral Daily  . polyvinyl alcohol  1 drop Both Eyes QID  . sodium chloride flush  10-40 mL Intracatheter Q12H  . sodium chloride flush  3 mL Intravenous Q12H  . Teduglutide (rDNA)  1.5 mg Subcutaneous Daily    Continuous Infusions: . lactated ringers 75 mL/hr at 04/14/21 0851  . TPN CYCLIC-ADULT (ION)       LOS: 6 days     Kayleen Memos, MD [facility] Pager [phone]  If 7PM-7AM, please contact night-coverage [email or web address] Password [employer] 04/14/2021, 6:38 PM

## 2021-04-14 NOTE — Progress Notes (Signed)
PHARMACY - TOTAL PARENTERAL NUTRITION CONSULT NOTE   Indication: Short bowel syndrome  Patient Measurements: Height: 5' 7"  (170.2 cm) Weight: 65.2 kg (143 lb 11.8 oz) IBW/kg (Calculated) : 61.6 TPN AdjBW (KG): 61 Body mass index is 22.51 kg/m. Usual Weight:   Assessment: Patient is a 11 yoF on chronic TPN d/t short gut syndrome after multiple bowel resections from Crohn's disease, frequent admissions in the past year for complicated line-associated bacteremia, presents with persistent abd pain with n/v/d and resultant metabolic derangements. Pharmacy to continue TPN while admitted.  Glucose / Insulin: cbgs (goal <150): wnl - sSSI q6h:  0 unit / 24 hrs Electrolytes: Na, K (inc to 4.9), Mag, Phos (dec to 2.7), CorrCa 9.3, all WNL.  Cl elevated:CO2 WNL Renal: SCr improved (0.89 > 1.17 > 1.19 > 1.26> 1.02), BUN 37 Hepatic: LFTs, Tbili wnl - TG 121 (5/14), 44 (5/16) Prealbumin 26.9 (5/14), inc to 28.6 (5/16) Intake / Output; MIVF: net I/O inaccurate.  Urine and stool multiple unmeasured occurrences.  Vomiting not documented.    Monitor output closely d/t reports of ongoing N/V/D.  Cdiff testing negative.  Home Gattex resumed.  IVF:  Added LR @ 75 on 5/18 to account for GI losses GI Imaging: - 5/13 abd CT: Distal colitis, which given the clinical history, is most likely due to inflammatory bowel disease. Extensive surgical changes of enterotomy and partial colectomy. No bowel obstruction GI Surgeries / Procedures:   Central access: right femoral PICC TPN start date: on TPN PTA --> continuing inpatient  Nutritional Goals: (per Dietitian on 5/15)  Goals:  Kcal:  1800-2000 kcal, Protein:  90-100 grams, Fluid:  >/= 1.8 L/day  Inpatient Cyclic TPN 5520 mL over 12 hrs provides: 99 g of protein and 1847 kcals per day.    Home TPN formula: 8022 mL cyclic over 12 hours (1 hr taper up/down) at 6pm - Total Kcal: 1824 kcal with lipid on MWF, and 1224 kcal without lipid on TTSS - Protein: 85  gm - dextrose: 260 gm - lipid: 60 gm (contains lipid on MWF only)   Current Nutrition:  TPN, Regular diet  Plan:  - Continue cyclic TPN:  3361 ml infused over 12 hrs starting at 1800 - Electrolytes in TPN: Na 80 mEq/L, K 63mq/L, Ca 5 mEq/L, Mg 557m/L, and Phos 2023m/L. Cl: Ac- Max Ac - Add standard MVI and trace elements to TPN - sensitive SSI and CBGs four times a day: 2 hrs past cyclic TPN start + 1 hr past cycle TPN d/c'ed + middle of TPN infusion + while off TPN --> 0000, 0700, 1200, 2000) - Monitor TPN labs on Mon/Thurs.  BMET, Mg, and Phos AM  ChrGretta ArabarmD, BCPS Clinical Pharmacist [facility] main pharmacy [phone]/2022 7:12 AM

## 2021-04-14 NOTE — Progress Notes (Signed)
OT Cancellation Note  Patient Details Name: Caitlin Peters MRN: [id number] DOB: Jul 29, 1961   Cancelled Treatment:    Reason Eval/Treat Not Completed: OT screened, no needs identified, will sign off. PT reports patient has no physical deficits - independent with ADLs and mobility. Has needed DME as well.  Delayla Hoffmaster L Allister Lessley 04/14/2021, 2:49 PM

## 2021-04-14 NOTE — Evaluation (Signed)
Physical Therapy One Time Evaluation Patient Details Name: Caitlin Peters MRN: [id number] DOB: [birth year] Today's Date: 04/14/2021   History of Present Illness  Pt is 60 yo female admitted with colitis on 04/07/21.  She has hx of Crohn's disease, multiple bowel resection, short gut syndrome, chronic TPN, chronic pain.  Clinical Impression  Pt admitted with above diagnosis.  She resides at home with daughter and grandso's.  At baseline, Pt is independent with ADLs and IADLs (does have supervision with showers for safety) and is able to ambulate community distances.  She was able to demonstrate independent transfers and ambulated 200' with supervision today.  Pt demonstrating safe balance and all VSS.  Pt is near baseline function.  No further acute PT needs.     Follow Up Recommendations No PT follow up    Equipment Recommendations  None recommended by PT    Recommendations for Other Services       Precautions / Restrictions Precautions Precautions: None      Mobility  Bed Mobility Overal bed mobility: Independent             General bed mobility comments: sitting EOB at arrival    Transfers Overall transfer level: Modified independent Equipment used: None Transfers: Sit to/from Stand Sit to Stand: Supervision         General transfer comment: supervision for safety but performed independently  Ambulation/Gait Ambulation/Gait assistance: Supervision Gait Distance (Feet): 200 Feet Assistive device: IV Pole Gait Pattern/deviations: Step-through pattern Gait velocity: normal   General Gait Details: Supervision for safety; pt pushed IV pole ; demonstrated safe gait pattern  Stairs            Wheelchair Mobility    Modified Rankin (Stroke Patients Only)       Balance Overall balance assessment: Independent Sitting-balance support: No upper extremity supported Sitting balance-Leahy Scale: Normal     Standing balance support: No upper extremity  supported Standing balance-Leahy Scale: Normal                               Pertinent Vitals/Pain Pain Assessment: 0-10 Pain Score: 7  Pain Location: generalized - chronic Pain Descriptors / Indicators: Discomfort;Aching Pain Intervention(s): Limited activity within patient's tolerance;Monitored during session    Home Living Family/patient expects to be discharged to:: Private residence Living Arrangements: Children (daughter and grandchildren) Available Help at Discharge: Family;Available PRN/intermittently (dtr works) Type of Home: Apartment Home Access: Stairs to enter Entrance Stairs-Rails: Right Entrance Stairs-Number of Steps: 12 Home Layout: One level Home Equipment: Environmental consultant - 2 wheels;Walker - 4 wheels Additional Comments: also has IV pole at home for TPN    Prior Function Level of Independence: Needs assistance   Gait / Transfers Assistance Needed: In the house does not use AD; uses RW outside or in community and has supervision when outisde; no recent falls  ADL's / [place] Needed: Reports independent with ADLs except has supervision for bathing for safety . Also, reports she assist with IADLs.        Hand Dominance   Dominant Hand: Right    Extremity/Trunk Assessment   Upper Extremity Assessment Upper Extremity Assessment: Overall WFL for tasks assessed    Lower Extremity Assessment Lower Extremity Assessment: Overall WFL for tasks assessed    Cervical / Trunk Assessment Cervical / Trunk Assessment: Normal  Communication   Communication: No difficulties  Cognition Arousal/Alertness: Awake/alert Behavior During Therapy: WFL for tasks assessed/performed Overall  Cognitive Status: Within Functional Limits for tasks assessed                                 General Comments: pleasant and motivated      General Comments General comments (skin integrity, edema, etc.): VSS    Exercises     Assessment/Plan     PT Assessment Patent does not need any further PT services  PT Problem List         PT Treatment Interventions      PT Goals (Current goals can be found in the Care Plan section)  Acute Rehab PT Goals Patient Stated Goal: return home PT Goal Formulation: All assessment and education complete, DC therapy    Frequency     Barriers to discharge        Co-evaluation               AM-PAC PT "6 Clicks" Mobility  Outcome Measure Help needed turning from your back to your side while in a flat bed without using bedrails?: None Help needed moving from lying on your back to sitting on the side of a flat bed without using bedrails?: None Help needed moving to and from a bed to a chair (including a wheelchair)?: None Help needed standing up from a chair using your arms (e.g., wheelchair or bedside chair)?: None Help needed to walk in hospital room?: None Help needed climbing 3-5 steps with a railing? : A Little 6 Click Score: 23    End of Session   Activity Tolerance: Patient tolerated treatment well Patient left: in bed;with call bell/phone within reach Nurse Communication: Mobility status      Time: 8099-8338 PT Time Calculation (min) (ACUTE ONLY): 25 min   Charges:   PT Evaluation $PT Eval Low Complexity: 1 Low          Brook Geraci, PT Acute Rehab Services Pager [phone] [facility] 04/14/2021, 2:47 PM

## 2021-04-14 NOTE — Progress Notes (Signed)
Nutrition Follow-up  INTERVENTION:   -Cyclic TPN management per Pharmacy  NUTRITION DIAGNOSIS:   Inadequate oral intake related to altered GI function (short bowel syndrome) as evidenced by other (comment) (need for TPN).  Ongoing.  GOAL:   Patient will meet greater than or equal to 90% of their needs  Meeting with TPN + PO  MONITOR:   PO intake,Labs,Weight trends,I & O's,Other (Comment) (TPN regimen)  ASSESSMENT:   60 y.o. female with medical history of complicated Crohn's disease, repeat bowel resection followed by [facility] GI, now with short bowel syndrome, TPN dependent, output managed with Gattex, chronic diffuse pain on methadone and PO Dilaudid, and moderate protein calorie malnutrition. She presented to the ED d/t poor appetite, N/V, foul-smelling stools, and severe abdominal pain. In the ED she was found to have hypokalemia, hypophosphatemia, hypomagnesemia, and acidosis.  Patient continues cyclic TPN, 2992 ml infused over 12 hours providing 1824 kcals (with lipids), 1224 kcals (w/o lipids) and 85g protein.  Currently consuming 100% of meals.   Admission weight: 134 lbs. Current weight: 143 lbs.  Medications: Lactated ringers, IV Zofran, Gattex  Labs reviewed: CBGs: 90-110  Diet Order:   Diet Order            Diet regular Room service appropriate? Yes; Fluid consistency: Thin  Diet effective now                 EDUCATION NEEDS:   No education needs have been identified at this time  Skin:  Skin Assessment: Reviewed RN Assessment  Last BM:  5/14 (type 6)  Height:   Ht Readings from Last 1 Encounters:  04/07/21 5' 7"  (1.702 m)    Weight:   Wt Readings from Last 1 Encounters:  04/14/21 65.2 kg    BMI:  Body mass index is 22.51 kg/m.  Estimated Nutritional Needs:   Kcal:  1800-2000 kcal  Protein:  90-100 grams  Fluid:  >/= 1.8 L/day  Clayton Bibles, MS, RD, LDN Inpatient Clinical Dietitian Contact information available via Amion

## 2021-04-14 NOTE — Progress Notes (Signed)
Pharmacy Note - Gattex (teduglutide) for injection:  I confirmed with Clelia Schaumann, nurse, who clarifed with Dr Domenica Fail at [facility] that [facility] prescription (at reduced renal dose of 0.025 mg/kg/day) is to inject 0.15 ml (1.5 mg) subq once daily.    Addiel Mccardle P. Legrand Como, PharmD, [place] Please utilize Amion for appropriate phone number to reach the unit pharmacist ([place]) 04/14/2021 1:44 PM

## 2021-04-15 DIAGNOSIS — K529 Noninfective gastroenteritis and colitis, unspecified: Secondary | ICD-10-CM | POA: Diagnosis not present

## 2021-04-15 DIAGNOSIS — E876 Hypokalemia: Secondary | ICD-10-CM | POA: Diagnosis not present

## 2021-04-15 DIAGNOSIS — K5080 Crohn's disease of both small and large intestine without complications: Secondary | ICD-10-CM | POA: Diagnosis not present

## 2021-04-15 LAB — BASIC METABOLIC PANEL
Anion gap: 6 (ref 5–15)
BUN: 33 mg/dL — ABNORMAL HIGH (ref 6–20)
CO2: 23 mmol/L (ref 22–32)
Calcium: 7.8 mg/dL — ABNORMAL LOW (ref 8.9–10.3)
Chloride: 109 mmol/L (ref 98–111)
Creatinine, Ser: 1.02 mg/dL — ABNORMAL HIGH (ref 0.44–1.00)
GFR, Estimated: >60 mL/min (ref >60)
Glucose, Bld: 121 mg/dL — ABNORMAL HIGH (ref 70–99)
Potassium: 4.6 mmol/L (ref 3.5–5.1)
Sodium: 138 mmol/L (ref 135–145)

## 2021-04-15 LAB — MAGNESIUM: Magnesium: 1.8 mg/dL (ref 1.7–2.4)

## 2021-04-15 LAB — PHOSPHORUS: Phosphorus: 3.1 mg/dL (ref 2.5–4.6)

## 2021-04-15 MED ORDER — HEPARIN SOD (PORK) LOCK FLUSH 100 UNIT/ML IV SOLN
250.0000 [IU] | INTRAVENOUS | Status: AC | PRN
  Administered 2021-04-15: 250 [IU]
  Filled 2021-04-15: qty 2.5

## 2021-04-15 MED ORDER — MAGNESIUM SULFATE IN D5W 1-5 GM/100ML-% IV SOLN
1.0000 g | Freq: Once | INTRAVENOUS | Status: DC
  Filled 2021-04-15: qty 100

## 2021-04-15 MED ORDER — HYDROMORPHONE HCL 4 MG PO TABS: 4.0000 mg | ORAL_TABLET | Freq: Four times a day (QID) | ORAL | 0 refills | Status: AC | PRN

## 2021-04-15 MED ORDER — HYDROMORPHONE HCL 1 MG/ML IJ SOLN
1.0000 mg | Freq: Once | INTRAMUSCULAR | Status: AC
  Administered 2021-04-15: 1 mg via INTRAVENOUS
  Filled 2021-04-15: qty 1

## 2021-04-15 NOTE — Progress Notes (Signed)
NP Blount paged about patient complaining of abdominal pain 9/10, PRN PO Dilaudid 63m every 6 hours given at 2225.

## 2021-04-15 NOTE — Consult Note (Signed)
Consultation Note Date: 04/15/2021   Patient Name: Caitlin Peters  DOB: 1961/05/07  MRN: [id number]  Age / Sex: 60 y.o., female  PCP: Caitlin Peters Referring Physician: Kayleen Memos, Peters  Reason for Consultation: Establishing goals of care  HPI/Patient Profile: 60 y.o. female  with past medical history of anemia, anxiety, CKD, cholangitis, choledocholithiasis status post ERCP, chronic pain, Crohn's disease status post multiple resections resulting in short gut syndrome, chronic need for TPN, discitis with recent completion of IV antibiotics, GERD, diverticulosis, hypertension, chronic pain currently on methadone, partial small bowel obstruction, seizures admitted on 04/07/2021 with abdominal pain, diarrhea, nausea and vomiting.  GI evaluated and recommended supportive management including addition of dicyclomine while continuing TPN and Gattex.  Recommendation was for patient to follow-up with her specialist, Caitlin Peters, at [facility] upon discharge.  Palliative consulted for goals of care.  Clinical Assessment and Goals of Care: I met today with Caitlin Peters.  She was sitting in bed in no distress at time of my encounter.  She reports feeling better today than she has the past couple of days.  I introduced palliative care as specialized medical care for people living with serious illness. It focuses on providing relief from the symptoms and stress of a serious illness. The goal is to improve quality of life for both the patient and the family.  Ms.  relays to me the long journey she has had with her chronic illness and struggles she and her family have faced with a history of autoimmune diseases.  She tells me the most important things to her are her family and her faith.  She has 2 daughters (1 of whom she lives with) and 6 grandchildren.  She is originally from [place] and has large family there  including another niece who is "like a daughter" to her.  We discussed her faith and that she places her reliance on God and her own mental strength to cope with her illness.  She is very clear in stating that, " God is not through with me and I am not going anywhere."  We discussed clinical course as well as wishes moving forward in regard to advanced directives.  Concepts specific to code status, surrogate decision making , and care plan this hospitalization discussed.  Values and goals of care important to patient and family were attempted to be elicited.  SUMMARY OF RECOMMENDATIONS   -Full code/full scope treatment -Caitlin Peters is invested in plan to continue with aggressive interventions.  She has been living with chronic illness for most of her life and finds joy in spending time with her family.  While she is hopeful not to have any further surgeries, she is open to any and all offered interventions moving forward. -She has completed paperwork to name her daughter is healthcare power of attorney.  We Peters not have a copy of this on file.  Her daughter is going to bring in and whenever she comes to visit. -She feels as though things are "  being rushed" and she needs time for her belly to calm down and work to transition back to her home medication regimen.  She feels as though things are starting to improve and feels that it usually takes about a week for things to start to improve whenever she has these flares.  She does have an outpatient appointment with her pain management specialist, Caitlin Peters, on Tuesday and tells me she knows her body and will be ready to go before then. -No other palliative specific recommendations at this time.  Please call if we can be of further assistance in the care of Caitlin Peters moving forward.   Code Status/Advance Care Planning:  Full code  Additional Recommendations (Limitations, Scope, Preferences):  Full Scope Treatment  Psycho-social/Spiritual:    Desire for further Chaplaincy support: Did not address today  Additional Recommendations: Caregiving  Support/Resources  Prognosis:   Unable to determine  Discharge Planning: Home with Home Health most likely     Primary Diagnoses: Present on Admission: . Colitis . Anemia . Anxiety . Chronic diarrhea . Chronic pain syndrome . CKD (chronic kidney disease) stage 3, GFR 30-59 ml/min ([name]) . Crohn's disease of small & large intestine with SGS . GERD (gastroesophageal reflux disease) . Hypokalemia . 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 Acidosis   I have reviewed the medical record, interviewed the patient and family, and examined the patient. The following aspects are pertinent.  Past Medical History:  Diagnosis Date  . Acute pancreatitis 04/13/2020  . Anasarca 10/2019  . AVN (avascular necrosis of bone) ([place])   . Cataract   . Choledocholithiasis (sludge) s/p ERCP 10/2019 10/21/2020  . Chronic pain syndrome   . CKD (chronic kidney disease), stage III ([place])   . Crohn disease ([place])   . Crohn's disease of small & large intestine with SGS 1984   Caitlin Peters is a 60year old female with a history of Crohn's disease diagnosed in 9 (age 2), history of long-term steroid use with osteoporosis, S/P multiple bowel resections ([id number]) complicated by chronic back and abdominal pain, steatorrhea and short bowel syndrome. The patient has been left with ~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 attached to proximal transverse colon through rectum. She has been  . Depression   . Diverticulosis   . GERD (gastroesophageal reflux disease)   . HTN (hypertension)   . IDA (iron deficiency anemia)   . Malnutrition ([place])   . Mass in chest   . Osteoporosis   . Osteoporosis 12/24/2014  . Pancreatitis   . 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 Vitamin B12 deficiency    Social History   Socioeconomic History  . Marital status: Single    Spouse name: Not on file  . Number of children: Not on file  . Years of education: Not on file  . Highest education level: Not on file  Occupational History  . Occupation: disabled  Tobacco Use  . Smoking status: Former Research scientist (life sciences)  . Smokeless tobacco: Never Used  Vaping Use  . Vaping Use: Never used  Substance and Sexual Activity  . Alcohol use: Not Currently  . Drug use: Never  . Sexual activity: Yes  Other Topics Concern  . Not on file  Social History Narrative   Right handed  One story apartment   No caffeine   Social Determinants of Health   Financial Resource Strain: Low Risk   . Difficulty of Paying Living Expenses: Not hard at all  Food Insecurity: Not on file  Transportation Needs: No Transportation Needs  . Lack of Transportation (Medical): No  . Lack of Transportation (Non-Medical): No  Physical Activity: Not on file  Stress: Not on file  Social Connections: Not on file   Family History  Problem Relation Age of Onset  . Breast cancer Sister   . Multiple sclerosis Sister   . Diabetes Sister   . Lupus Sister   . Colon cancer Other   . Crohn's disease Other   . Seizures Mother   . Glaucoma Mother   . CAD Father   . Heart disease Father   . Hypertension Father    Scheduled Meds: . amLODipine  10 mg Oral Daily  . budesonide  3 mg Oral QODAY  . buPROPion  200 mg Oral Daily  . Chlorhexidine Gluconate Cloth  6 each Topical Daily  . cycloSPORINE  1 drop Both Eyes BID  . dicyclomine  20 mg Oral QID  . diphenoxylate-atropine  1 tablet Oral QID  . DULoxetine  90 mg Oral Daily  . enoxaparin (LOVENOX) injection  40 mg Subcutaneous Q24H  . hydrALAZINE  75 mg Oral TID  . insulin aspart  0-9 Units Subcutaneous Q6H  . levETIRAcetam  500 mg Oral BID  . loperamide  2 mg Oral QID  .  methadone  5 mg Oral 5 X Daily  . metoprolol tartrate  100 mg Oral BID  . pantoprazole  40 mg Oral Daily  . polyvinyl alcohol  1 drop Both Eyes QID  . sodium chloride flush  10-40 mL Intracatheter Q12H  . sodium chloride flush  3 mL Intravenous Q12H  . Teduglutide (rDNA)  1.5 mg Subcutaneous Daily   Continuous Infusions: . lactated ringers 75 mL/hr at 04/14/21 2226  . magnesium sulfate bolus IVPB     PRN Meds:.acetaminophen **OR** acetaminophen, diphenhydrAMINE-zinc acetate, HYDROmorphone, labetalol, lipase/protease/amylase, naLOXone (NARCAN)  injection, ondansetron (ZOFRAN) IV, sodium chloride flush Medications Prior to Admission:  Prior to Admission medications   Medication Sig Start Date End Date Taking? Authorizing Provider  acetaminophen (TYLENOL) 325 MG tablet Take 650 mg by mouth every 6 (six) hours as needed for mild pain.    Yes Provider, Historical, Peters  amLODipine (NORVASC) 10 MG tablet Take 10 mg by mouth daily.   Yes Provider, Historical, Peters  budesonide (ENTOCORT EC) 3 MG 24 hr capsule Take 3 capsules (9 mg total) by mouth daily. Patient taking differently: Take 3 mg by mouth every other day. 03/06/21  Yes Caitlin Peters  buPROPion [facility] SR) 100 MG 12 hr tablet Take 200 mg by mouth daily. 03/02/21  Yes Provider, Historical, Peters  Calcium 200 MG TABS Take 200 mg by mouth daily.   Yes Provider, Historical, Peters  cholecalciferol (VITAMIN D3) 25 MCG (1000 UT) tablet Take 1,000 Units by mouth daily.    Yes Provider, Historical, Peters  cycloSPORINE (RESTASIS) 0.05 % ophthalmic emulsion Place 1 drop into both eyes 2 (two) times daily.   Yes Provider, Historical, Peters  denosumab (PROLIA) 60 MG/ML SOSY injection Inject 60 mg into the skin every 6 (six) months.   Yes Provider, Historical, Peters  dexlansoprazole (DEXILANT) 60 MG capsule Take 1 capsule (60 mg total) by mouth daily. 12/28/20  Yes Caitlin Peters  Dextran 70-Hypromellose  0.1-0.3 % SOLN Place 1 drop into  both eyes 4 (four) times daily.   Yes Provider, Historical, Peters  diphenoxylate-atropine (LOMOTIL) 2.5-0.025 MG tablet TAKE 1 TABLET BY MOUTH 4 TIMES DAILY AS NEEDED FOR DIARRHEA OR  LOOSE  STOOLS Patient taking differently: Take 1-2 tablets by mouth 4 (four) times daily as needed for diarrhea or loose stools. 12/28/20  Yes Caitlin Peters  DULoxetine (CYMBALTA) 30 MG capsule TAKE 3 CAPSULES BY MOUTH ONCE DAILY Patient taking differently: Take 90 mg by mouth daily. 03/22/21  Yes Caitlin Peters  estradiol (ESTRACE) 2 MG tablet Take 1 tablet (2 mg total) by mouth daily. 12/07/20  Yes Caitlin Peters  famotidine (PEPCID) 20 MG tablet Take 20 mg by mouth daily as needed for heartburn or indigestion.   Yes Provider, Historical, Peters  GATTEX 5 MG KIT Inject 5 mLs into the skin daily. 01/03/21  Yes Provider, Historical, Peters  hydrALAZINE (APRESOLINE) 50 MG tablet Take 75 mg by mouth 3 (three) times daily.   Yes Provider, Historical, Peters  HYDROmorphone (DILAUDID) 4 MG tablet Take 4 mg by mouth 4 (four) times daily as needed for moderate pain.  09/15/20  Yes Provider, Historical, Peters  levETIRAcetam (KEPPRA) 500 MG tablet Take 500 mg by mouth 2 (two) times daily.   Yes Provider, Historical, Peters  lipase/protease/amylase (CREON) 36000 UNITS CPEP capsule Take 1 capsule (36,000 Units total) by mouth 3 (three) times daily as needed (with meals for digestion). 06/28/20  Yes Caitlin Peters  loperamide (IMODIUM) 2 MG capsule TAKE 1 CAPSULE BY MOUTH AS NEEDED FOR DIARRHEA OR LOOSE STOOLS Patient taking differently: Take 2 mg by mouth daily as needed for diarrhea or loose stools. 01/20/21  Yes Caitlin Peters  methadone (DOLOPHINE) 5 MG tablet Take 1 tablet (5 mg total) by mouth 5 (five) times daily. 06/30/20  Yes Florencia Reasons, Peters  metoprolol tartrate (LOPRESSOR) 100 MG tablet Take 1 tablet (100 mg total) by mouth 2 (two) times daily. 02/21/21  Yes Caitlin Bliss,  [name], Peters  Multiple Vitamins-Minerals (MULTIVITAMIN ADULT PO) Take 1 tablet by mouth daily.   Yes Provider, Historical, Peters  NARCAN 4 MG/0.1ML LIQD nasal spray kit Place 1 spray into the nose once as needed (overdose). 10/14/20  Yes Provider, Historical, Peters  PRESCRIPTION MEDICATION Inject 1 each into the vein daily. Home TPN . [facility] . 1 bag for 12 hours. [phone]   Yes Provider, Historical, Peters  Probiotic Product (PROBIOTIC-10 PO) Take 1 capsule by mouth daily.    Yes Provider, Historical, Peters  promethazine (PHENERGAN) 25 MG tablet TAKE 1 TABLET BY MOUTH EVERY 6 HOURS AS NEEDED FOR NAUSEA FOR VOMITING Patient taking differently: Take 25 mg by mouth every 6 (six) hours as needed for nausea or vomiting. 03/22/21  Yes Caitlin Peters  sodium chloride 0.9 % infusion Inject 1 mL into the vein daily as needed (flush). 10/16/20  Yes Provider, Historical, Peters  sucralfate (CARAFATE) 1 GM/10ML suspension Take 10 mLs (1 g total) by mouth 4 (four) times daily -  with meals and at bedtime. 06/28/20  Yes Caitlin Peters  Trace Minerals Cu-Mn-Se-Zn (TRALEMENT IV) Inject 1 mL into the vein See admin instructions. Used in TPN bag 4 times weekly   Yes Provider, Historical, Peters  vitamin B-12 (CYANOCOBALAMIN) 100 MCG tablet Take 1,000 mcg by mouth daily.    Yes  Provider, Historical, Peters   Allergies  Allergen Reactions  . Meperidine Hives    Other reaction(s): GI Upset Due to [place]   . Hyoscyamine Hives and Swelling    Legs swelling   Disorientation  . Cefepime Other (See Comments)    Neurotoxicity occurring in setting of AKI. Ceftriaxone tolerated during same admit  . Gabapentin Other (See Comments)    unknown  . Lyrica [Pregabalin] Other (See Comments)    unknown  . Topamax [Topiramate] Other (See Comments)    unknown  . Zosyn [Piperacillin Sod-Tazobactam So]     Patient reports it makes her vomit, her neck stiff, and her "heart feel  funny"  . Fentanyl Rash    Pt is allergic to fentanyl patch related to the glue (gives her a rash) Pt states she is NOT allergic to fentanyl IV medicine  . Morphine And Related Rash   Review of Systems  Constitutional: Positive for activity change, fatigue and unexpected weight change.  Gastrointestinal: Positive for abdominal distention, abdominal pain and diarrhea.  Psychiatric/Behavioral: Positive for sleep disturbance.   Physical Exam General: Alert, awake, in no acute distress.  HEENT: No bruits, no goiter, no JVD Heart: Regular rate and rhythm. No murmur appreciated. Lungs: Good air movement, clear Abdomen: positive bowel sounds.  Ext: No significant edema Skin: Warm and dry Neuro: Grossly intact, nonfocal.   Vital Signs: BP (!) 153/65 (BP Location: Left Arm)   Pulse 75   Temp 98.5 F (36.9 C) (Oral)   Resp 16   Ht 5' 7"  (1.702 m)   Wt 68.7 kg   SpO2 99%   BMI 23.72 kg/m  Pain Scale: 0-10 POSS *See Group Information*: 1-Acceptable,Awake and alert Pain Score: Asleep   SpO2: SpO2: 99 % O2 Device:SpO2: 99 % O2 Flow Rate: .   IO: Intake/output summary:   Intake/Output Summary (Last 24 hours) at 04/15/2021 0836 Last data filed at 04/15/2021 0600 Gross per 24 hour  Intake 3843.99 ml  Output --  Net 3843.99 ml    LBM: Last BM Date: 04/14/21 Baseline Weight: Weight: 61 kg Most recent weight: Weight: 68.7 kg     Palliative Assessment/Data:   Flowsheet Rows   Flowsheet Row Most Recent Value  Intake Tab   Referral Department Hospitalist  Unit at Time of Referral Med/Surg Unit  Palliative Care Primary Diagnosis Sepsis/Infectious Disease  Date Notified 04/13/21  Palliative Care Type New Palliative care  Reason for referral Clarify Goals of Care  Date of Admission 04/07/21  Date first seen by Palliative Care 04/14/21  # of days Palliative referral response time 1 Day(s)  # of days IP prior to Palliative referral 6  Clinical Assessment   Palliative  Performance Scale Score 40%  Psychosocial & Spiritual Assessment   Palliative Care Outcomes   Patient/Family meeting held? Yes  Who was at the meeting? Patient      Time In: 1600 Time Out: 1700 Time Total: 60 Greater than 50%  of this time was spent counseling and coordinating care related to the above assessment and plan.  Signed by: Micheline Rough, Peters   Please contact Palliative Medicine Team phone at [phone] for questions and concerns.  For individual provider: See Shea Evans

## 2021-04-15 NOTE — Discharge Summary (Signed)
Discharge Summary  Caitlin Peters AQT:[id number] DOB: 09-Jul-1961  PCP: Caitlin Peters, Caitlin Halsted, MD  Admit date: 04/07/2021 Discharge date: 04/15/2021  Time spent: 35 minutes.  Recommendations for Outpatient Follow-up:  1. Follow-up with GI in 1 to 2 weeks. 2. Follow-up with pain clinic within a week. 3. Follow-up with your primary care provider in 1 to 2 weeks. 4. Follow-up with neurology in 1 to 2 weeks. 5. Take your medications as prescribed. 6. Continue fall precautions.  Discharge Diagnoses:  Active Hospital Problems   Diagnosis Date Noted  . Colitis 04/07/2021  . Acidosis 04/08/2021  . Seizures ([address]) 12/17/2020  . CKD (chronic kidney disease) stage 3, GFR 30-59 ml/min ([name]) 12/06/2020  . 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 Anxiety 06/03/2020  . Chronic pain syndrome   . GERD (gastroesophageal reflux disease)   . Chronic diarrhea 10/12/2019  . Hypokalemia 10/12/2019  . Anemia 12/02/2016  . Crohn's disease of small & large intestine with [facility] Problems  No resolved problems to display.    Discharge Condition: Stable.  Diet recommendation: Resume previous diet.  Vitals:   04/14/21 2058 04/15/21 0423  BP: 140/65 (!) 153/65  Pulse: 82 75  Resp: 16 16  Temp: 98.2 F (36.8 C) 98.5 F (36.9 C)  SpO2: 100% 99%    History of present illness:   Caitlin Peters is a60 y.o.F with Crohn's disease, multiple bowel resections in [place], on Budesonide followed by [facility] Dr. Domenica Peters, now with short gut syndrome, TPN dependent, output managed with Gattex, chronic diffuse pain on methadone and PO Dilaudid, and moderate protein calorie malnutrition who presented with anorexia, vomiting/nausea, foul stools and severe abdominal pain for about 1 week.  Work-up revealed colitis seen on CT. C. difficile PCR and acute GI panel were negative.   Was seen by GI, did not feel she has active Crohn's.  GI recommended supportive management including Bentyl and  continue TPN and Gattex.  Patient has declined Bentyl stating it does not work for her.  GI also recommended to advance diet as tolerated, no directed new Crohn's medication, judicious analgesia.  Patient will need to follow-up with Dr. Domenica Peters at [facility] upon hospital discharge.  04/15/21: Has no new complaints, mainly chronic pain, on home methadone and on home p.o. Dilaudid.  Advised to follow-up with pain clinic as soon as possible.  Patient understands and agrees with plan.    Hospital Course:  Principal Problem:   Colitis Active Problems:   Hypokalemia   Chronic diarrhea   Crohn's disease of small & large intestine with SGS   GERD (gastroesophageal reflux disease)   Chronic pain syndrome   Anxiety   Anemia   Methadone dependence ([name])   CKD (chronic kidney disease) stage 3, GFR 30-59 ml/min ([name])   [name]   Acidosis  Resolved hypokalemia, hypophosphatemia, hypomagnesemia, non-anion gap metabolic acidosis due to diarrhea Continue home TPN   Chronic diarrhea and abdominal pain/cramps Distal colitis seen on CT scan GI panel negative, C. difficile PCR negative.  Seen by GI, doubtful that this is related to Crohn's flare. They have added Bentyl however the patient has declined. Continue TPN Avoid dehydration  Anemia of chronic disease Hemoglobin uptrending 8.9 from 8.8.   No overt bleeding.  Crohn's disease Short gut syndrome Continue home regimen Follow-up with your gastroenterologist within a week.  Chronic pain syndrome Mood disorder Continue home regimen Follow-up with your pain medicine provider and PCP as soon as possible.  Hypertension BP stable Continue home regimen Follow-up with your PCP  History of seizures No seizure activity -Continue Keppra Seizure precautions  GERD Continue home regimen  Left femoral head avascular necrosis seen on CT abdomen pelvis without contrast on 04/07/2021. Patient is aware of this and has been taking fall  precautions at home Continue fall precautions   Code Status:FULL code.   Procedures:  None.  Consultations:  GI.  Discharge Exam: BP (!) 153/65 (BP Location: Left Arm)   Pulse 75   Temp 98.5 F (36.9 C) (Oral)   Resp 16   Ht 5' 7" (1.702 m)   Wt 68.7 kg   SpO2 99%   BMI 23.72 kg/m  . General: 60 y.o. year-old female well developed well nourished in no acute distress.  Alert and oriented x3. . Cardiovascular: Regular rate and rhythm with no rubs or gallops.  No thyromegaly or JVD noted.   [name] Kitchen Respiratory: Clear to auscultation with no wheezes or rales. . Abdomen: Soft nondistended with normal bowel sounds x4 quadrants. . Musculoskeletal: No lower extremity edema.  [name] Kitchen Psychiatry: Mood is appropriate for condition and setting  Discharge Instructions You were cared for by a hospitalist during your hospital stay. If you have any questions about your discharge medications or the care you received while you were in the hospital after you are discharged, you can call the unit and asked to speak with the hospitalist on call if the hospitalist that took care of you is not available. Once you are discharged, your primary care physician will handle any further medical issues. Please note that NO REFILLS for any discharge medications will be authorized once you are discharged, as it is imperative that you return to your primary care physician (or establish a relationship with a primary care physician if you do not have one) for your aftercare needs so that they can reassess your need for medications and monitor your lab values.   Allergies as of 04/15/2021      Reactions   Meperidine Hives   Other reaction(s): GI Upset Due to Chrones   Hyoscyamine Hives, Swelling   Legs swelling Disorientation   Cefepime Other (See Comments)   Neurotoxicity occurring in setting of AKI. Ceftriaxone tolerated during same admit   Gabapentin Other (See Comments)   unknown   Lyrica [pregabalin] Other  (See Comments)   unknown   Topamax [topiramate] Other (See Comments)   unknown   Zosyn [piperacillin Sod-tazobactam So]    Patient reports it makes her vomit, her neck stiff, and her "heart feel funny"   Fentanyl Rash   Pt is allergic to fentanyl patch related to the glue (gives her a rash) Pt states she is NOT allergic to fentanyl IV medicine   Morphine And Related Rash      Medication List    TAKE these medications   acetaminophen 325 MG tablet Commonly known as: TYLENOL Take 650 mg by mouth every 6 (six) hours as needed for mild pain.   amLODipine 10 MG tablet Commonly known as: NORVASC Take 10 mg by mouth daily.   budesonide 3 MG 24 hr capsule Commonly known as: ENTOCORT EC Take 3 capsules (9 mg total) by mouth daily. What changed:   how much to take  when to take this   buPROPion 100 MG 12 hr tablet Commonly known as: WELLBUTRIN SR Take 200 mg by mouth daily.   Calcium 200 MG Tabs Take 200 mg by mouth daily.   cholecalciferol 25 MCG (1000  UNIT) tablet Commonly known as: VITAMIN D3 Take 1,000 Units by mouth daily.   cycloSPORINE 0.05 % ophthalmic emulsion Commonly known as: RESTASIS Place 1 drop into both eyes 2 (two) times daily.   denosumab 60 MG/ML Sosy injection Commonly known as: PROLIA Inject 60 mg into the skin every 6 (six) months.   dexlansoprazole 60 MG capsule Commonly known as: Dexilant Take 1 capsule (60 mg total) by mouth daily.   Dextran 70-Hypromellose 0.1-0.3 % Soln Place 1 drop into both eyes 4 (four) times daily.   diphenoxylate-atropine 2.5-0.025 MG tablet Commonly known as: LOMOTIL TAKE 1 TABLET BY MOUTH 4 TIMES DAILY AS NEEDED FOR DIARRHEA OR  LOOSE  STOOLS What changed:   how much to take  how to take this  when to take this  reasons to take this  additional instructions   DULoxetine 30 MG capsule Commonly known as: CYMBALTA TAKE 3 CAPSULES BY MOUTH ONCE DAILY   estradiol 2 MG tablet Commonly known as:  ESTRACE Take 1 tablet (2 mg total) by mouth daily.   famotidine 20 MG tablet Commonly known as: PEPCID Take 20 mg by mouth daily as needed for heartburn or indigestion.   Gattex 5 MG Kit Generic drug: Teduglutide (rDNA) Inject 5 mLs into the skin daily.   hydrALAZINE 50 MG tablet Commonly known as: APRESOLINE Take 75 mg by mouth 3 (three) times daily.   HYDROmorphone 4 MG tablet Commonly known as: DILAUDID Take 1 tablet (4 mg total) by mouth 4 (four) times daily as needed for up to 1 day for moderate pain.   levETIRAcetam 500 MG tablet Commonly known as: KEPPRA Take 500 mg by mouth 2 (two) times daily.   lipase/protease/amylase 36000 UNITS Cpep capsule Commonly known as: Creon Take 1 capsule (36,000 Units total) by mouth 3 (three) times daily as needed (with meals for digestion).   loperamide 2 MG capsule Commonly known as: IMODIUM TAKE 1 CAPSULE BY MOUTH AS NEEDED FOR DIARRHEA OR LOOSE STOOLS What changed:   how much to take  how to take this  when to take this  reasons to take this  additional instructions   methadone 5 MG tablet Commonly known as: DOLOPHINE Take 1 tablet (5 mg total) by mouth 5 (five) times daily.   metoprolol tartrate 100 MG tablet Commonly known as: LOPRESSOR Take 1 tablet (100 mg total) by mouth 2 (two) times daily.   MULTIVITAMIN ADULT PO Take 1 tablet by mouth daily.   Narcan 4 MG/0.1ML Liqd nasal spray kit Generic drug: naloxone Place 1 spray into the nose once as needed (overdose).   PRESCRIPTION MEDICATION Inject 1 each into the vein daily. Home TPN . [facility] . 1 bag for 12 hours. [phone]   PROBIOTIC-10 PO Take 1 capsule by mouth daily.   promethazine 25 MG tablet Commonly known as: PHENERGAN TAKE 1 TABLET BY MOUTH EVERY 6 HOURS AS NEEDED FOR NAUSEA FOR VOMITING What changed: See the new instructions.   sodium chloride 0.9 % infusion Inject 1 mL into the vein daily as needed  (flush).   sucralfate 1 GM/10ML suspension Commonly known as: Carafate Take 10 mLs (1 g total) by mouth 4 (four) times daily -  with meals and at bedtime.   TRALEMENT IV Inject 1 mL into the vein See admin instructions. Used in TPN bag 4 times weekly   vitamin B-12 100 MCG tablet Commonly known as: CYANOCOBALAMIN Take 1,000 mcg by mouth daily.  Allergies  Allergen Reactions  . Meperidine Hives    Other reaction(s): GI Upset Due to Chrones   . Hyoscyamine Hives and Swelling    Legs swelling   Disorientation  . Cefepime Other (See Comments)    Neurotoxicity occurring in setting of AKI. Ceftriaxone tolerated during same admit  . Gabapentin Other (See Comments)    unknown  . Lyrica [Pregabalin] Other (See Comments)    unknown  . Topamax [Topiramate] Other (See Comments)    unknown  . Zosyn [Piperacillin Sod-Tazobactam So]     Patient reports it makes her vomit, her neck stiff, and her "heart feel funny"  . Fentanyl Rash    Pt is allergic to fentanyl patch related to the glue (gives her a rash) Pt states she is NOT allergic to fentanyl IV medicine  . Morphine And Related Rash    Follow-up Information    Caitlin Peters, Caitlin Halsted, MD. Call in 1 day(s).   Specialty: Internal Medicine Why: Please call for a post hospital follow up appointment Contact information: [place] [address] [phone]        Carlyle Basques, MD. Call in 1 day(s).   Specialty: Infectious Diseases Why: Please call for a post hospital follow up appointment Contact information: [place] [address] [phone]        Werner Lean, MD. Call in 1 day(s).   Specialty: Cardiology Why: please call for a post hospital follow up appointment Contact information: [place] [address] [phone]        Cameron Sprang, MD. Call in 1 day(s).   Specialty: Neurology Why: Please call for a  post hospital follow up appointment Contact information: [place]  [place] [address] [phone]        Brayton Layman, MD. Call in 1 day(s).   Specialty: Student Why: Please call for a post hospital follow up appointment Contact information: [place] [address] [phone]                The results of significant diagnostics from this hospitalization (including imaging, microbiology, ancillary and laboratory) are listed below for reference.    Significant Diagnostic Studies: CT Abdomen Pelvis Wo Contrast  Result Date: 04/07/2021 CLINICAL DATA:  Abdominal pain since April. Nausea and vomiting. Weight loss. On TPN for short gut syndrome. Chronic diarrhea. Crohn disease. EXAM: CT ABDOMEN AND PELVIS WITHOUT CONTRAST TECHNIQUE: Multidetector CT imaging of the abdomen and pelvis was performed following the standard protocol without IV contrast. COMPARISON:  03/10/2021 FINDINGS: Lower chest: Right lower lobe calcified granuloma. Centrilobular emphysema. Normal heart size without pericardial or pleural effusion. Hepatobiliary: Normal noncontrast appearance of the liver. Pneumobilia. Cholecystectomy. Pancreas: Normal noncontrast appearance of the pancreas. Spleen: Normal in size, without focal abnormality. Adrenals/Urinary Tract: Normal adrenal glands. No renal calculi or hydronephrosis. No bladder calculi. Stomach/Bowel: Normal stomach, without wall thickening. Suspect partial colectomy. The remaining rectosigmoid colon is thick walled and fluid-filled, including on 62/2. Extensive surgical sutures involving the small bowel, which is normal in caliber. Low sensitivity for enteritis secondary to lack of oral or IV contrast. Vascular/Lymphatic: Right femoral venous line terminates in the IVC at the level of the hepatic veins, similar. Normal caliber of the aorta and branch vessels. No abdominopelvic adenopathy. Reproductive: Hysterectomy.  No adnexal mass.  Other: No significant free fluid.  Mild pelvic floor laxity. Musculoskeletal: Left femoral head avascular necrosis without  collapse. Osteopenia. Presumed bone island in the left iliac wing. Mild convex left lumbar spine curvature. IMPRESSION: 1. Degraded exam secondary to lack of IV or oral contrast. 2. Distal colitis, which given the clinical history, is most likely due to inflammatory bowel disease. Infection could look similar. 3. Extensive surgical changes of enterotomy and partial colectomy. No bowel obstruction. 4. Left femoral head avascular necrosis. 5.  Emphysema (ICD10-J43.9). Electronically Signed   By: Abigail Miyamoto M.D.   On: 04/07/2021 16:00   IR Fluoro Guide CV Line Right  Result Date: 04/03/2021 INDICATION: 60 year old woman with longstanding history of Crohn's and occluded jugular and upper extremity venous access presents to interventional radiology for exchange of right common femoral vein dual lumen tunneled power line exchange. Power line current Lea malfunctioning with 1 hub leaking and the other not aspirating. EXAM: Fluoroscopy guided exchange of right common femoral vein tunneled power line. MEDICATIONS: None ANESTHESIA/SEDATION: Moderate (conscious) sedation was employed during this procedure. A total of Versed 1.5 mg and Fentanyl 100 mcg was administered intravenously. Moderate Sedation Time: 14 minutes. [name] level of consciousness and vital signs were monitored continuously by radiology nursing throughout the procedure under my direct supervision. FLUOROSCOPY TIME:  Fluoroscopy Time: 1 minutes 0 seconds (2 mGy). COMPLICATIONS: None immediate. PROCEDURE: Informed written consent was obtained from the patient after a thorough discussion of the procedural risks, benefits and alternatives. All questions were addressed. Maximal Sterile Barrier Technique was utilized including caps, mask, sterile gowns, sterile gloves, sterile drape, hand hygiene and skin antiseptic. A timeout was  performed prior to the initiation of the procedure. External segment of the existing dual lumen catheter surrounding skin prepped and draped in usual fashion. Following local lidocaine administration, the existing right common femoral vein tunneled power line was removed over two 0.018 inch guidewires. New tunneled power line was cut to the same length and inserted over the guidewires without difficulty. The tip was positioned in the inferior vena cava, confirmed by fluoroscopy. Both lumens aspirated and flushed well. They new line was secured to skin with suture and insertion site covered with bio patch and sterile dressing. IMPRESSION: Successful exchange of dual lumen right common femoral vein approach tunneled power line. Electronically Signed   By: Miachel Roux M.D.   On: 04/03/2021 16:10    Microbiology: Recent Results (from the past 240 hour(s))  Resp Panel by RT-PCR (Flu A&B, Covid) Nasopharyngeal Swab     Status: None   Collection Time: 04/07/21  5:58 PM   Specimen: Nasopharyngeal Swab; Nasopharyngeal(NP) swabs in vial transport medium  Result Value Ref Range Status   SARS Coronavirus 2 by RT PCR NEGATIVE NEGATIVE Final    Comment: (NOTE) SARS-CoV-2 target nucleic acids are NOT DETECTED.  The SARS-CoV-2 RNA is generally detectable in upper respiratory specimens during the acute phase of infection. The lowest concentration of SARS-CoV-2 viral copies this assay can detect is 138 copies/mL. A negative result does not preclude SARS-Cov-2 infection and should not be used as the sole basis for treatment or other patient management decisions. A negative result may occur with  improper specimen collection/handling, submission of specimen other than nasopharyngeal swab, presence of viral mutation(s) within the areas targeted by this assay, and inadequate number of viral copies(<138 copies/mL). A negative result must be combined with clinical observations, patient history, and  epidemiological information. The expected result is Negative.  Fact Sheet for Patients:  EntrepreneurPulse.com.au  Fact Sheet for Healthcare Providers:  IncredibleEmployment.be  This test is no t yet  approved or cleared by the [place] and  has been authorized for detection and/or diagnosis of SARS-CoV-2 by FDA under an Emergency Use Authorization (EUA). This EUA will remain  in effect (meaning this test can be used) for the duration of the COVID-19 declaration under Section 564(b)(1) of the Act, 21 U.S.C.section 360bbb-3(b)(1), unless the authorization is terminated  or revoked sooner.       Influenza A by PCR NEGATIVE NEGATIVE Final   Influenza B by PCR NEGATIVE NEGATIVE Final    Comment: (NOTE) The Xpert Xpress SARS-CoV-2/FLU/RSV plus assay is intended as an aid in the diagnosis of influenza from Nasopharyngeal swab specimens and should not be used as a sole basis for treatment. Nasal washings and aspirates are unacceptable for Xpert Xpress SARS-CoV-2/FLU/RSV testing.  Fact Sheet for Patients: EntrepreneurPulse.com.au  Fact Sheet for Healthcare Providers: IncredibleEmployment.be  This test is not yet approved or cleared by the [place] FDA and has been authorized for detection and/or diagnosis of SARS-CoV-2 by FDA under an Emergency Use Authorization (EUA). This EUA will remain in effect (meaning this test can be used) for the duration of the COVID-19 declaration under Section 564(b)(1) of the Act, 21 U.S.C. section 360bbb-3(b)(1), unless the authorization is terminated or revoked.  Performed at [facility], [place] [address] [place],  [address]   Gastrointestinal Panel by PCR , Stool     Status: None   Collection Time: 04/08/21  2:00 AM   Specimen: Stool  Result Value Ref Range Status   Campylobacter species NOT DETECTED NOT DETECTED Final   Plesimonas  shigelloides NOT DETECTED NOT DETECTED Final   Salmonella species NOT DETECTED NOT DETECTED Final   Yersinia enterocolitica NOT DETECTED NOT DETECTED Final   Vibrio species NOT DETECTED NOT DETECTED Final   Vibrio cholerae NOT DETECTED NOT DETECTED Final   Enteroaggregative E coli (EAEC) NOT DETECTED NOT DETECTED Final   Enteropathogenic E coli (EPEC) NOT DETECTED NOT DETECTED Final   Enterotoxigenic E coli (ETEC) NOT DETECTED NOT DETECTED Final   Shiga like toxin producing E coli (STEC) NOT DETECTED NOT DETECTED Final   Shigella/Enteroinvasive E coli (EIEC) NOT DETECTED NOT DETECTED Final   Cryptosporidium NOT DETECTED NOT DETECTED Final   Cyclospora cayetanensis NOT DETECTED NOT DETECTED Final   Entamoeba histolytica NOT DETECTED NOT DETECTED Final   Giardia lamblia NOT DETECTED NOT DETECTED Final   Adenovirus F40/41 NOT DETECTED NOT DETECTED Final   Astrovirus NOT DETECTED NOT DETECTED Final   Norovirus GI/GII NOT DETECTED NOT DETECTED Final   Rotavirus A NOT DETECTED NOT DETECTED Final   Sapovirus (I, II, IV, and V) NOT DETECTED NOT DETECTED Final    Comment: Performed at [facility], [place] [place], [place] [address]  Calprotectin, Fecal     Status: None   Collection Time: 04/10/21  1:52 PM   Specimen: Stool  Result Value Ref Range Status   Calprotectin, Fecal 27 0 - 120 ug/g Final    Comment: (NOTE) Concentration     Interpretation   Follow-Up <16 - 50 ug/g     Normal           None >50 -120 ug/g     Borderline       Re-evaluate in 4-6 weeks    >120 ug/g     Abnormal         Repeat as clinically  indicated Performed At: [facility] [place], [place] [id number] Rush Farmer MD NF:[id number]   C Difficile Quick Screen (NO PCR Reflex)     Status: None   Collection Time: 04/11/21  1:48 PM  Result Value Ref Range Status   C Diff antigen NEGATIVE NEGATIVE Final   C Diff toxin NEGATIVE NEGATIVE Final    C Diff interpretation No C. difficile detected.  Final    Comment: Performed at [facility], [place] [address] [place], [place] [address]     Labs: Basic Metabolic Panel: Recent Labs  Lab 04/11/21 0824 04/12/21 0422 04/13/21 0414 04/14/21 0608 04/15/21 0630  NA 137 137 136 138 138  K 4.2 4.1 4.5 4.9 4.6  CL 106 108 110 112* 109  CO2 _0 GLUCOSE 119* 133* 125* 109* 121*  BUN 41* 42* 42* 37* 33*  CREATININE 1.17* 1.19* 1.26* 1.02* 1.02*  CALCIUM 8.8* 7.9* 7.8* 7.5* 7.8*  MG 1.9 1.9 2.1 2.0 1.8  PHOS 4.0 3.4 3.1 2.7 3.1   Liver Function Tests: Recent Labs  Lab 04/09/21 0345 04/10/21 0605 04/13/21 0414  AST  --  27 11*  ALT  --  43 20  ALKPHOS  --  85 73  BILITOT  --  0.3 0.2*  PROT  --  6.1* 5.4*  ALBUMIN 2.3* 2.6* 2.1*   No results for input(s): LIPASE, AMYLASE in the last 168 hours. No results for input(s): AMMONIA in the last 168 hours. CBC: Recent Labs  Lab 04/10/21 0605 04/12/21 0422 04/13/21 1229 04/14/21 0608  WBC 4.9 5.0 4.3 4.0  NEUTROABS 2.4  --   --   --   HGB 10.7* 8.8* 8.8* 8.9*  HCT 32.7* 27.4* 27.0* 27.6*  MCV 91.9 92.9 94.1 94.5  PLT 210 178 177 185   Cardiac Enzymes: No results for input(s): CKTOTAL, CKMB, CKMBINDEX, TROPONINI in the last 168 hours. BNP: BNP (last 3 results) No results for input(s): BNP in the last 8760 hours.  ProBNP (last 3 results) No results for input(s): PROBNP in the last 8760 hours.  CBG: Recent Labs  Lab 04/14/21 0744 04/14/21 1124 04/14/21 1624 04/14/21 2058 04/14/21 2337  GLUCAP 106* 90 93 109* 116*       Signed:  Kayleen Memos, MD [facility] 04/15/2021, 1:38 PM

## 2021-04-15 NOTE — Plan of Care (Signed)
  Problem: Education: Goal: Knowledge of General Education information will improve Description: Including pain rating scale, medication(s)/side effects and non-pharmacologic comfort measures Outcome: Adequate for Discharge   Problem: Health Behavior/Discharge Planning: Goal: Ability to manage health-related needs will improve Outcome: Adequate for Discharge   Problem: Clinical Measurements: Goal: Ability to maintain clinical measurements within normal limits will improve Outcome: Adequate for Discharge Goal: Will remain free from infection Outcome: Adequate for Discharge Goal: Respiratory complications will improve Outcome: Adequate for Discharge Goal: Cardiovascular complication will be avoided Outcome: Adequate for Discharge   Problem: Activity: Goal: Risk for activity intolerance will decrease Outcome: Adequate for Discharge   Problem: Nutrition: Goal: Adequate nutrition will be maintained Outcome: Adequate for Discharge   Problem: Coping: Goal: Level of anxiety will decrease Outcome: Adequate for Discharge   Problem: Elimination: Goal: Will not experience complications related to bowel motility Outcome: Adequate for Discharge Goal: Will not experience complications related to urinary retention Outcome: Adequate for Discharge   Problem: Pain Managment: Goal: General experience of comfort will improve Outcome: Adequate for Discharge   Problem: Safety: Goal: Ability to remain free from injury will improve Outcome: Adequate for Discharge   Problem: Skin Integrity: Goal: Risk for impaired skin integrity will decrease Outcome: Adequate for Discharge

## 2021-04-15 NOTE — Progress Notes (Addendum)
Pt. Discharged via wheelchair accompanied by staff, Pt. Is alert and oriented, no distress. Discharge instruction and education given to the patient, pt. Verbalized understanding PICC line was hep locked by IVT. All personal belongings are with the pt. Home meds given to patient

## 2021-04-15 NOTE — Discharge Instructions (Signed)
Chronic Diarrhea Chronic diarrhea is a condition in which a person passes frequent loose and watery stools for 4 weeks or longer. Non-chronic diarrhea usually lasts for only 2-3 days. Diarrhea can cause a person to feel weak and dehydrated. Dehydration can make the person tired and thirsty. It can also cause a dry mouth, decreased urination, and dark yellow urine. Diarrhea is a sign of an underlying problem, such as:  Infection.  Side effects of medicines.  Problems digesting something in your diet, such as milk products if you have lactose intolerance.  Conditions such as celiac disease, irritable bowel syndrome (IBS), or inflammatory bowel disease (IBD). If you have chronic diarrhea, make sure you treat it as told by your health care provider. Follow these instructions at home: Medicines  Take over-the-counter and prescription medicines only as told by your health care provider.  If you were prescribed an antibiotic medicine, take it as told by your health care provider. Do not stop taking the antibiotic even if you start to feel better. Eating and drinking  Follow instructions from your health care provider about what to eat and drink. You may have to: ? Avoid foods that trigger diarrhea for you. ? Take an oral rehydration solution (ORS). This is a drink that keeps you hydrated. It can be found at pharmacies and retail stores. ? Drink clear fluids, such as water, diluted fruit juice, and low-calorie sports drinks. You can also get fluids by sucking on ice chips. ? Drink enough fluid to keep your urine pale yellow. This will help you avoid dehydration. ? Eat small amounts of bland foods that are easy to digest as you are able. These foods include bananas, applesauce, rice, lean meats, toast, and crackers. ? Avoid spicy or fatty foods. ? Avoid foods and beverages that contain a lot of sugar or caffeine.  Do not drink alcohol if: ? Your health care provider tells you not to  drink. ? You are pregnant, may be pregnant, or are planning to become pregnant.  If you drink alcohol: ? Limit how much you use to:  0-1 drink a day for women.  0-2 drinks a day for men. ? Be aware of how much alcohol is in your drink. In the [place] one drink equals one 12 oz bottle of beer (355 mL), one 5 oz glass of wine (148 mL), or one 1 oz glass of hard liquor (44 mL).   General instructions  Wash your hands often and after each diarrhea episode. Use soap and water. If soap and water are not available, use hand sanitizer.  Make sure that all people in your household wash their hands well and often.  Rest as told by your health care provider.  Watch your condition for any changes.  Take a warm bath to relieve any burning or pain from frequent diarrhea episodes.  Keep all follow-up visits as told by your health care provider. This is important.   Contact a health care provider if:  You have a fever.  Your diarrhea gets worse or does not get better.  You have new symptoms.  You cannot drink fluid without vomiting.  You feel light-headed or dizzy.  You have a headache.  You have muscle cramps.  You have severe pain in the rectum. Get help right away if:  You have vomiting that does not go away.  You have chest pain.  You feel very weak or you faint.  You have bloody or black stools, or stools that look  like tar.  You have severe pain, cramping, or bloating in your abdomen, or pain that stays in one place.  You have trouble breathing or you are breathing very quickly.  Your heart is beating very quickly.  Your skin feels cold and clammy.  You feel confused.  You have a severe headache.  You have signs or symptoms of dehydration, such as: ? Dark urine, very little urine, or no urine. ? Cracked lips. ? Dry mouth. ? Sunken eyes. ? Sleepiness. ? Weakness. These symptoms may represent a serious problem that is an emergency. Do not wait to see if the  symptoms will go away. Get medical help right away. Call your local emergency services (911 in the [place] Do not drive yourself to the hospital. Summary  Chronic diarrhea is a condition in which a person passes frequent loose and watery stools for 4 weeks or longer.  Diarrhea is a sign of an underlying problem.  Make sure you treat your diarrhea as told by your health care provider.  Drink enough fluid to keep your urine pale yellow. This will help you avoid dehydration.  Wash your hands often and after each diarrhea episode. If soap and water are not available, use hand sanitizer. This information is not intended to replace advice given to you by your health care provider. Make sure you discuss any questions you have with your health care provider. Document Revised: 05/11/2019 Document Reviewed: 05/11/2019 Elsevier Patient Education  2021 [place].   Chronic Pain, Adult Chronic pain is a type of pain that lasts or keeps coming back for at least 3-6 months. You may have headaches, pain in the abdomen, or pain in other areas of the body. Chronic pain may be related to an illness, such as fibromyalgia or complex regional pain syndrome. Chronic pain may also be related to an injury or a health condition. Sometimes, the cause of chronic pain is not known. Chronic pain can make it hard for you to do daily activities. If not treated, chronic pain can lead to anxiety and depression. Treatment depends on the cause and severity of your pain. You may need to work with a pain specialist to come up with a treatment plan. The plan may include medicine, counseling, and physical therapy. Many people benefit from a combination of two or more types of treatment to control their pain. Follow these instructions at home: Medicines  Take over-the-counter and prescription medicines only as told by your health care provider.  Ask your health care provider if the medicine prescribed to you: ? Requires you to  avoid driving or using machinery. ? Can cause constipation. You may need to take these actions to prevent or treat constipation:  Drink enough fluid to keep your urine pale yellow.  Take over-the-counter or prescription medicines.  Eat foods that are high in fiber, such as beans, whole grains, and fresh fruits and vegetables.  Limit foods that are high in fat and processed sugars, such as fried or sweet foods. Treatment plan Follow your treatment plan as told by your health care provider. This may include:  Gentle, regular exercise.  Eating a healthy diet that includes foods such as vegetables, fruits, fish, and lean meats.  Cognitive or behavioral therapy that changes the way you think or act in response to the pain. This may help improve how you feel.  Working with a physical therapist.  Meditation, yoga, acupuncture, or massage therapy.  Aroma, color, light, or sound therapy.  Local electrical stimulation.  The electrical pulses help to relieve pain by temporarily stopping the nerve impulses that cause you to feel pain.  Injections. These deliver numbing or pain-relieving medicines into the spine or the area of pain.   Lifestyle  Ask your health care provider whether you should keep a pain diary. Your health care provider will tell you what information to write in the diary. This may include when you have pain, what the pain feels like, and how medicines and other behaviors or treatments help to reduce the pain.  Consider talking with a mental health care provider about how to manage chronic pain.  Consider joining a chronic pain support group.  Try to control or lower your stress levels. Talk with your health care provider about ways to do this.   General instructions  Learn as much as you can about how to manage your chronic pain. Ask your health care provider if an intensive pain rehabilitation program or a chronic pain specialist would be helpful.  Check your pain level  as told by your health care provider. Ask your health care provider if you should use a pain scale.  It is up to you to get the results of any tests that were done. Ask your health care provider, or the department that is doing the tests, when your results will be ready.  Keep all follow-up visits as told by your health care provider. This is important. Contact a health care provider if:  Your pain gets worse, or you have new pain.  You have trouble sleeping.  You have trouble doing your normal activities.  Your pain is not controlled with treatment.  You have side effects from pain medicine.  You feel weak.  You notice any other changes that show that your condition is getting worse. Get help right away if:  You lose feeling or have numbness in your body.  You lose control of bowel or bladder function.  Your pain suddenly gets much worse.  You develop shaking or chills.  You develop confusion.  You develop chest pain.  You have trouble breathing or shortness of breath.  You pass out.  You have thoughts about hurting yourself or others. If you ever feel like you may hurt yourself or others, or have thoughts about taking your own life, get help right away. Go to your nearest emergency department or:  Call your local emergency services (911 in the [place]  Call a suicide crisis helpline, such as the [facility] at 610-066-7038. This is open 24 hours a day in the [place]  Text the Crisis Text Line at (939) 370-2447 (in the [place].). Summary  Chronic pain is a type of pain that lasts or keeps coming back for at least 3-6 months.  Chronic pain may be related to an illness, injury, or other health condition. Sometimes, the cause of chronic pain is not known.  Treatment depends on the cause and severity of your pain.  Many people benefit from a combination of two or more types of treatment to control their pain.  Follow your treatment plan as told by your  health care provider. This information is not intended to replace advice given to you by your health care provider. Make sure you discuss any questions you have with your health care provider. Document Revised: 07/30/2019 Document Reviewed: 07/30/2019 Elsevier Patient Education  [place].

## 2021-04-15 NOTE — Progress Notes (Signed)
PHARMACY - TOTAL PARENTERAL NUTRITION CONSULT NOTE   Indication: Short bowel syndrome  Patient Measurements: Height: 5' 7"  (170.2 cm) Weight: 68.7 kg (151 lb 7.3 oz) IBW/kg (Calculated) : 61.6 TPN AdjBW (KG): 61 Body mass index is 23.72 kg/m. 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 Pharmacy to continue TPN while admitted.  Glucose / Insulin: CBGs (goal <180): WNL. Range 90-121 - sSSI q6h: required 0 unit / 24 hrs Electrolytes: All WNL including CorrCa (9.3); Mg (1.8) on lower end of range.  Renal: SCr improved (1.02), BUN 33 trending down Hepatic: LFTs, Tbili wnl - TG 121 (5/14), 44 (5/16) Prealbumin 26.9 (5/14), inc to 28.6 (5/16) - WNL Intake / Output; MIVF: net I/O inaccurate.  Urine and stool multiple unmeasured occurrences.  Vomiting not documented.    Monitor output closely d/t reports of ongoing N/V/D.  Cdiff testing negative.  Home Gattex resumed 5/16.  IVF:  Added LR @ 75 on 5/18 to account for GI losses GI Imaging: - 5/13 abd CT: Distal colitis, which given the clinical history, is most likely due to inflammatory bowel disease. Extensive surgical changes of enterotomy and partial colectomy. No bowel obstruction GI Surgeries / Procedures:   Central access: right femoral PICC TPN start date: on TPN PTA --> continuing inpatient  Nutritional Goals: (per Dietitian on 5/20):  Goals:  Kcal:  1800-2000 kcal, Protein:  90-100 grams, Fluid:  >/= 1.8 L/day  Inpatient Cyclic TPN 2482 mL over 12 hrs provides: 99 g of protein and 1847 kcals per day.    Home TPN formula: 5003 mL cyclic over 12 hours (1 hr taper up/down) at 6pm - Total Kcal: 1824 kcal with lipid on MWF, and 1224 kcal without lipid on TTSS - Protein: 85  gm - dextrose: 260 gm - lipid: 60 gm (contains lipid on MWF only)  Current Nutrition:  TPN, Regular diet  Plan:  Now:  Magnesium 1 g IV once  Discharge orders signed, patient chronically on TPN managed outpatient. Will not order inpatient TPN since pt discharging today  Lenis Noon, PharmD 04/15/21 10:41 AM

## 2021-04-18 ENCOUNTER — Encounter: Payer: Self-pay | Admitting: *Deleted

## 2021-04-18 ENCOUNTER — Telehealth: Payer: Self-pay

## 2021-04-18 NOTE — Telephone Encounter (Signed)
Transition Care Management Unsuccessful Follow-up Telephone Call  Date of discharge and from where:  04/15/2021 [facility]   Attempts:  1st Attempt  Reason for unsuccessful TCM follow-up call:  Left voice message

## 2021-04-21 ENCOUNTER — Other Ambulatory Visit: Payer: Self-pay | Admitting: Internal Medicine

## 2021-04-21 DIAGNOSIS — K912 Postsurgical malabsorption, not elsewhere classified: Secondary | ICD-10-CM

## 2021-04-26 ENCOUNTER — Inpatient Hospital Stay: Payer: Medicare Other | Admitting: Internal Medicine

## 2021-04-27 ENCOUNTER — Ambulatory Visit (INDEPENDENT_AMBULATORY_CARE_PROVIDER_SITE_OTHER): Payer: Medicare Other | Admitting: Internal Medicine

## 2021-04-27 ENCOUNTER — Other Ambulatory Visit: Payer: Self-pay

## 2021-04-27 ENCOUNTER — Encounter: Payer: Self-pay | Admitting: Internal Medicine

## 2021-04-27 ENCOUNTER — Telehealth: Payer: Self-pay

## 2021-04-27 VITALS — BP 138/64 | HR 81 | Temp 99.1°F | Ht 68.0 in | Wt 145.0 lb

## 2021-04-27 DIAGNOSIS — G061 Intraspinal abscess and granuloma: Secondary | ICD-10-CM | POA: Diagnosis present

## 2021-04-27 DIAGNOSIS — K6289 Other specified diseases of anus and rectum: Secondary | ICD-10-CM | POA: Diagnosis not present

## 2021-04-27 NOTE — Telephone Encounter (Signed)
Patient has femoral PICC line for blood draws. Spoke to [place] at [facility] to get fax number and make her aware that lab orders are being sent.   Faxed orders for sed rate and CRP to [facility] per Dr. Baxter Flattery. Received receipt of successful fax transmission.   Lara Mulch: [phone] F: [place], RN

## 2021-04-27 NOTE — Progress Notes (Signed)
RFV: hospital follow up for discitis/osteo  Patient ID: Caitlin Peters, female   DOB: 1961-03-06, 60 y.o.   MRN: [id number]  HPI 41 F with hx of crohns and TPN dependent, limited venous access with chronic right femoral line. She was hospitalized in mid march for diarrhea and back pain. 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 Paravertebral and ventral epidural phlegmon. No significant canal stenosis. No Abscess. No surgical management but was treated with iv abtx through end of April. We had seen her in early march with the plan to get repeat imaging off of abtx. In the meantime she Had her femoral line replaced on 5/9 but then started to have profuse diarrhea concerning for flare thus she was re-hospitalized from 5/13-21 and treated for symptoms. Unclear if mri was missed, not done during her hospitalization. She has remained off of abtx. She denies having  fever or chills, but today not feeling well because because rectal pain and occasional back pain. She denies dysuria.   Outpatient Encounter Medications as of 04/27/2021  Medication Sig  . acetaminophen (TYLENOL) 325 MG tablet Take 650 mg by mouth every 6 (six) hours as needed for mild pain.   [name] Kitchen amLODipine (NORVASC) 10 MG tablet Take 10 mg by mouth daily.  . budesonide (ENTOCORT EC) 3 MG 24 hr capsule Take 3 capsules (9 mg total) by mouth daily. (Patient taking differently: Take 3 mg by mouth every other day.)  . buPROPion (WELLBUTRIN SR) 100 MG 12 hr tablet Take 200 mg by mouth daily.  . Calcium 200 MG TABS Take 200 mg by mouth daily.  . cholecalciferol (VITAMIN D3) 25 MCG (1000 UT) tablet Take 1,000 Units by mouth daily.   . cycloSPORINE (RESTASIS) 0.05 % ophthalmic emulsion Place 1 drop into both eyes 2 (two) times daily.  [name] Kitchen denosumab (PROLIA) 60 MG/ML SOSY injection Inject 60 mg into the skin every 6 (six) months.  . dexlansoprazole (DEXILANT) 60 MG capsule Take 1 capsule (60 mg total) by mouth  daily.  [name] Kitchen Dextran 70-Hypromellose 0.1-0.3 % SOLN Place 1 drop into both eyes 4 (four) times daily.  . diphenoxylate-atropine (LOMOTIL) 2.5-0.025 MG tablet TAKE 1 TABLET BY MOUTH 4 TIMES DAILY AS NEEDED FOR DIARRHEA OR  LOOSE  STOOLS (Patient taking differently: Take 1-2 tablets by mouth 4 (four) times daily as needed for diarrhea or loose stools.)  . DULoxetine (CYMBALTA) 30 MG capsule TAKE 3 CAPSULES BY MOUTH ONCE DAILY (Patient taking differently: Take 90 mg by mouth daily.)  . estradiol (ESTRACE) 2 MG tablet Take 1 tablet (2 mg total) by mouth daily.  . famotidine (PEPCID) 20 MG tablet Take 20 mg by mouth daily as needed for heartburn or indigestion.  [name] Kitchen GATTEX 5 MG KIT Inject 5 mLs into the skin daily.  . hydrALAZINE (APRESOLINE) 50 MG tablet Take 75 mg by mouth 3 (three) times daily.  [name] Kitchen HYDROmorphone (DILAUDID) 4 MG tablet Take by mouth.  . levETIRAcetam (KEPPRA) 500 MG tablet Take 500 mg by mouth 2 (two) times daily.  . lipase/protease/amylase (CREON) 36000 UNITS CPEP capsule Take 1 capsule (36,000 Units total) by mouth 3 (three) times daily as needed (with meals for digestion).  [name] Kitchen loperamide (IMODIUM) 2 MG capsule TAKE 1 CAPSULE BY MOUTH AS NEEDED FOR DIARRHEA OR LOOSE STOOLS (Patient taking differently: Take 2 mg by mouth daily as needed for diarrhea or loose stools.)  . methadone (DOLOPHINE) 5 MG tablet Take 1 tablet (5 mg total) by mouth 5 (five)  times daily.  . metoprolol tartrate (LOPRESSOR) 100 MG tablet Take 1 tablet (100 mg total) by mouth 2 (two) times daily.  . Multiple Vitamins-Minerals (MULTIVITAMIN ADULT PO) Take 1 tablet by mouth daily.  [name] Kitchen NARCAN 4 MG/0.1ML LIQD nasal spray kit Place 1 spray into the nose once as needed (overdose).  [name] Kitchen PRESCRIPTION MEDICATION Inject 1 each into the vein daily. Home TPN . [facility] [place] . 1 bag for 12 hours. [phone]  . Probiotic Product (PROBIOTIC-10 PO) Take 1 capsule by mouth daily.   . promethazine (PHENERGAN) 25  MG tablet TAKE 1 TABLET BY MOUTH EVERY 6 HOURS AS NEEDED FOR NAUSEA FOR VOMITING  . sucralfate (CARAFATE) 1 GM/10ML suspension Take 10 mLs (1 g total) by mouth 4 (four) times daily -  with meals and at bedtime.  . Trace Minerals Cu-Mn-Se-Zn (TRALEMENT IV) Inject 1 mL into the vein See admin instructions. Used in TPN bag 4 times weekly  . vitamin B-12 (CYANOCOBALAMIN) 100 MCG tablet Take 1,000 mcg by mouth daily.   . sodium chloride 0.9 % infusion Inject 1 mL into the vein daily as needed (flush).   No facility-administered encounter medications on file as of 04/27/2021.     Patient Active Problem List   Diagnosis Date Noted  . Acidosis 04/08/2021  . Colitis 04/07/2021  . Intractable nausea and vomiting 03/11/2021  . Thoracic discitis 02/10/2021  . Pressure injury of skin 12/18/2020  . Bacteremia 12/17/2020  . Seizures ([place]) 12/17/2020  . Drug-seeking behavior 12/16/2020  . Seizure ([place]) 12/15/2020  . CKD (chronic kidney disease) stage 3, GFR 30-59 ml/min ([name]) 12/06/2020  . COVID-19 virus infection 12/06/2020  . Protein calorie malnutrition ([place]) 12/06/2020  . Palpitations 11/22/2020  . PAC (premature atrial contraction) 11/22/2020  . History of central line-associated bloodstream infection (CLABSI) 10/21/2020  . Nausea & vomiting 10/21/2020  . Choledocholithiasis (sludge) s/p ERCP 10/2019 10/21/2020  . 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 Abdominal pain 09/07/2020  . Acute metabolic encephalopathy   . Hypomagnesemia   . Partial small bowel obstruction ([place])   . Bacteremia associated with intravascular line ([place]) 08/04/2020  . Enterobacter sepsis ([place]) 07/22/2020  . Anxiety 06/03/2020  . Acute pancreatitis 04/13/2020  . Infection due to Acinetobacter species 03/10/2020  . Pancytopenia ([place]) 03/05/2020  . Central line infection   . Elevated liver enzymes 01/02/2020  . Cholangitis   . Anasarca 10/28/2019  . Acute kidney injury superimposed on chronic kidney disease ([place])  10/28/2019  . Falls 10/28/2019  . Malnutrition ([place])   . Vitamin B12 deficiency   . GERD (gastroesophageal reflux disease)   . Chronic pain syndrome   . Hypokalemia due to excessive gastrointestinal loss of potassium 10/13/2019  . Fever   . Hypokalemia 10/12/2019  . Chronic diarrhea 10/12/2019  . Dysuria 10/12/2019  . Bilateral lower extremity edema 10/12/2019  . AKI (acute kidney injury) ([place]) 10/12/2019  . Fungemia 08/27/2019  . IDA (iron deficiency anemia) 11/03/2018  . Dilation of biliary tract 08/28/2018  . Severe diarrhea 03/07/2018  . LFTs abnormal 01/27/2018  . GI tract obstruction ([place]) 01/27/2018  . Gram-negative bacteremia 09/13/2017  . HTN (hypertension), benign 12/02/2016  . Intractable pain 12/02/2016  . Anemia 12/02/2016  . 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 Avascular necrosis ([place]) 06/14/2015  . Polyarthralgia 05/03/2015  . On total parenteral nutrition (TPN) 12/30/2014  . Osteoporosis 12/24/2014  . Low back pain 12/16/2014  . Vitamin D deficiency 12/16/2014  . 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  History of colonic diverticulitis 2014  . Depression 07/24/2012  . Gram-positive bacteremia 07/24/2012  . Small bowel obstruction due to adhesions ([place]) 07/24/2012  . Diarrhea 12/13/2011  . Neuralgia and neuritis 06/01/2011  . Myalgia and myositis 08/12/2003  . Crohn's disease of small & large intestine with SGS 1984     Health Maintenance Due  Topic Date Due  . Zoster Vaccines- Shingrix (1 of 2) Never done     Review of Systems 12 point ros is negative except what is mentioned above Physical Exam   BP 138/64   Pulse 81   Temp 99.1 F (37.3 C) (Oral)   Ht _0  (1.727 m)   Wt 145 lb (65.8 kg)   SpO2 96%   BMI 22.05 kg/m   Physical Exam  Constitutional:  oriented to person, place, and time. appears well-developed and well-nourished. No distress.  HENT: /AT, PERRLA, no scleral icterus Mouth/Throat: Oropharynx is clear and moist. No oropharyngeal  exudate.  Cardiovascular: Normal rate, regular rhythm and normal heart sounds. Exam reveals no gallop and no friction rub.  No murmur heard.  Pulmonary/Chest: Effort normal and breath sounds normal. No respiratory distress.  has no wheezes.  Neck = supple, no nuchal rigidity Abdominal: Soft. Bowel sounds are normal.  exhibits no distension. There is no tenderness.  Back: right sided paraspinal/SI joint pain Ext: right femoral line is c/d/i Lymphadenopathy: no cervical adenopathy. No axillary adenopathy Neurological: alert and oriented to person, place, and time.  Skin: Skin is warm and dry. No rash noted. No erythema.  Psychiatric: a normal mood and affect.  behavior is normal.   CBC Lab Results  Component Value Date   WBC 4.0 04/14/2021   RBC 2.92 (L) 04/14/2021   HGB 8.9 (L) 04/14/2021   HCT 27.6 (L) 04/14/2021   PLT 185 04/14/2021   MCV 94.5 04/14/2021   MCH 30.5 04/14/2021   MCHC 32.2 04/14/2021   RDW 15.1 04/14/2021   LYMPHSABS 2.1 04/10/2021   MONOABS 0.3 04/10/2021   EOSABS 0.1 04/10/2021    BMET Lab Results  Component Value Date   NA 138 04/15/2021   K 4.6 04/15/2021   CL 109 04/15/2021   CO2 23 04/15/2021   GLUCOSE 121 (H) 04/15/2021   BUN 33 (H) 04/15/2021   CREATININE 1.02 (H) 04/15/2021   CALCIUM 7.8 (L) 04/15/2021   GFRNONAA >60 04/15/2021   GFRAA 55 (L) 08/12/2020      Assessment and Plan Hx of thoracic osteo with epidural phlegmon, medically managed -finished course of abtx at end of April.  Need to have open mri for thoracic spine w and wo contrast to see if improvement since treatment  Will need to check sed rate and crp ( [facility] [phone]- [place])   Rectal pain = if worsens, should call PCP or be admitted for evaluation of chron's flare. Not having diarrhea or bloody stools at the moment  Will check in again with patient tomorrow to see if any improvement in symptoms.

## 2021-05-01 ENCOUNTER — Other Ambulatory Visit: Payer: Self-pay

## 2021-05-02 ENCOUNTER — Encounter: Payer: Self-pay | Admitting: Internal Medicine

## 2021-05-02 ENCOUNTER — Ambulatory Visit (INDEPENDENT_AMBULATORY_CARE_PROVIDER_SITE_OTHER): Payer: Medicare Other | Admitting: Internal Medicine

## 2021-05-02 VITALS — BP 142/62 | HR 70 | Temp 98.1°F | Ht 68.0 in | Wt 148.2 lb

## 2021-05-02 DIAGNOSIS — K50819 Crohn's disease of both small and large intestine with unspecified complications: Secondary | ICD-10-CM

## 2021-05-02 DIAGNOSIS — Z09 Encounter for follow-up examination after completed treatment for conditions other than malignant neoplasm: Secondary | ICD-10-CM

## 2021-05-02 DIAGNOSIS — K529 Noninfective gastroenteritis and colitis, unspecified: Secondary | ICD-10-CM

## 2021-05-02 NOTE — Progress Notes (Signed)
Established Patient Office Visit     This visit occurred during the SARS-CoV-2 public health emergency.  Safety protocols were in place, including screening questions prior to the visit, additional usage of staff PPE, and extensive cleaning of exam room while observing appropriate contact time as indicated for disinfecting solutions.    CC/Reason for Visit: Hospital follow-up  HPI: Caitlin Peters is a 60 y.o. female who is coming in today for the above mentioned reasons. 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  She is on TPN nightly.  She has chronic pain syndrome related to this and is followed by a pain management specialist.  She had T2/T5 discitis and completed antibiotic therapy in April.  Her follow-up MRI has been scheduled for next week.  She was Hospitalized from 5/13 through 04/15/2021 due to abdominal pain, nausea and diarrhea.  She was found to have colitis on CT scan.  GI pathogen panel and C. difficile were negative.  She was seen by GI who did not believe this was a Crohn's flare.  She was discharged home after symptomatic improvement.  She is to follow-up with her tertiary center GI at [facility] later this month.  She has been feeling improved since her discharge.  She continues to have bilateral lower extremity edema likely due to IV fluids received during hospitalization.   Past Medical/Surgical History: Past Medical History:  Diagnosis Date  . Acute pancreatitis 04/13/2020  . Anasarca 10/2019  . AVN (avascular necrosis of bone) ([place])   . Cataract   . Choledocholithiasis (sludge) s/p ERCP 10/2019 10/21/2020  . Chronic pain syndrome   . CKD (chronic kidney disease), stage III ([place])   . Crohn disease ([place])   . Crohn's disease of small & large intestine with SGS 1984   Caitlin Peters is a 60year old female with a  history of Crohn's disease diagnosed in 29 (age 81), history of long-term steroid use with osteoporosis, S/P multiple bowel resections ([id number]) complicated by chronic back and abdominal pain, steatorrhea and short bowel syndrome. The patient has been left with ~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 attached to proximal transverse colon through rectum. She has been  . Depression   . Diverticulosis   . GERD (gastroesophageal reflux disease)   . HTN (hypertension)   . IDA (iron deficiency anemia)   . Malnutrition ([place])   . Mass in chest   . Osteoporosis   . Osteoporosis 12/24/2014  . Pancreatitis   . 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; 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; jejunal to transverse colon anastomosis Treated at [place] SB lengthening to 165cm Dr Alene Mires, [facility]  ESTABLISHED AT [facility]  . Vitamin B12 deficiency     Past Surgical History:  Procedure Laterality Date  . ABDOMINAL ADHESION SURGERY  01/22/2018  . APPENDECTOMY  1989  . BILIARY DILATION  11/26/2019   Procedure: BILIARY DILATION;  Surgeon: Jackquline Denmark, MD;  Location: [facility];  Service: Endoscopy;;  . BILIARY DILATION  03/08/2020   Procedure: BILIARY DILATION;  Surgeon: Irving Copas., MD;  Location: [place];  Service: Gastroenterology;;  . BIOPSY  03/08/2020   Procedure: BIOPSY;  Surgeon: Irving Copas., MD;  Location: [place];  Service: Gastroenterology;;  . CHEST WALL RESECTION  right thoracotomy,resection of chest mass with anterior rib and reconstruction using prosthetic mesh and video arthroscopy  . CHOLECYSTECTOMY  01/22/2018  . COLONOSCOPY  2019  . ENTEROSTOMY CLOSURE  04/1999  . ERCP N/A 11/26/2019   Procedure: ENDOSCOPIC RETROGRADE CHOLANGIOPANCREATOGRAPHY (ERCP);  Surgeon: Jackquline Denmark, MD;  Location: [facility];  Service: Endoscopy;  Laterality: N/A;  . ERCP N/A 03/08/2020   Procedure:  ENDOSCOPIC RETROGRADE CHOLANGIOPANCREATOGRAPHY (ERCP);  Surgeon: Irving Copas., MD;  Location: [place];  Service: Gastroenterology;  Laterality: N/A;  . ESOPHAGOGASTRODUODENOSCOPY N/A 03/08/2020   Procedure: ESOPHAGOGASTRODUODENOSCOPY (EGD);  Surgeon: Irving Copas., MD;  Location: [place];  Service: Gastroenterology;  Laterality: N/A;  . EUS N/A 03/08/2020   Procedure: UPPER ENDOSCOPIC ULTRASOUND (EUS) LINEAR;  Surgeon: Irving Copas., MD;  Location: [place];  Service: Gastroenterology;  Laterality: N/A;  . ILEOCECETOMY  03/1999   ileocolon resection with abdominal stoma  . ILEOSTOMY CLOSURE  2001  . IR FLUORO GUIDE CV LINE LEFT  01/07/2020  . IR FLUORO GUIDE CV LINE LEFT  03/09/2020  . IR FLUORO GUIDE CV LINE LEFT  05/09/2020  . IR FLUORO GUIDE CV LINE LEFT  07/20/2020  . IR FLUORO GUIDE CV LINE RIGHT  08/05/2020  . IR FLUORO GUIDE CV LINE RIGHT  04/03/2021  . IR PTA VENOUS EXCEPT DIALYSIS CIRCUIT  01/07/2020  . IR REMOVAL TUN CV CATH W/O FL  08/05/2020  . IR US GUIDE VASC ACCESS LEFT     x 2 06/17/19 and 09/14/2019  . IR US GUIDE VASC ACCESS RIGHT  08/05/2020  . KNEE SURGERY     right knee   . LAPAROSCOPIC SMALL BOWEL RESECTION  2009   2000-2009.  SB resections for Crohns Disease - now with Short gut  . OMENTECTOMY  01/22/2018  . PARTIAL HYSTERECTOMY  1984   with LSO  . REMOVAL OF STONES  11/26/2019   Procedure: REMOVAL OF STONES;  Surgeon: Jackquline Denmark, MD;  Location: [facility];  Service: Endoscopy;;  . REMOVAL OF STONES  03/08/2020   Procedure: REMOVAL OF STONES;  Surgeon: Irving Copas., MD;  Location: [place];  Service: Gastroenterology;;  . SALPINGOOPHORECTOMY Left 1984  . SALPINGOOPHORECTOMY Right 1990  . SERIAL TRANSVERSE ENTEROPLASTY (STEP) - SMALL BOWEL LENGTHENING  01/22/2018   Dr Alene Mires, [place] length from 120 to 165cm   . SMALL INTESTINE SURGERY  2002  . SMALL INTESTINE SURGERY  2003  . SPHINCTEROTOMY   11/26/2019   Procedure: SPHINCTEROTOMY;  Surgeon: Jackquline Denmark, MD;  Location: [facility];  Service: Endoscopy;;  . TOTAL ABDOMINAL HYSTERECTOMY  1990   with RSO  . UPPER GASTROINTESTINAL ENDOSCOPY      Social History:  reports that she has quit smoking. She has never used smokeless tobacco. She reports previous alcohol use. She reports that she does not use drugs.  Allergies: Allergies  Allergen Reactions  . Meperidine Hives    Other reaction(s): GI Upset Due to Chrones   . Hyoscyamine Hives and Swelling    Legs swelling   Disorientation  . Cefepime Other (See Comments)    Neurotoxicity occurring in setting of AKI. Ceftriaxone tolerated during same admit  . Gabapentin Other (See Comments)    unknown  . Lyrica [Pregabalin] Other (See Comments)    unknown  . Topamax [Topiramate] Other (See Comments)    unknown  . Zosyn [Piperacillin Sod-Tazobactam So]     Patient reports it makes her vomit, her neck stiff, and her "heart feel  funny"  . Fentanyl Rash    Pt is allergic to fentanyl patch related to the glue (gives her a rash) Pt states she is NOT allergic to fentanyl IV medicine  . Morphine And Related Rash    Family History:  Family History  Problem Relation Age of Onset  . Breast cancer Sister   . Multiple sclerosis Sister   . Diabetes Sister   . Lupus Sister   . Colon cancer Other   . Crohn's disease Other   . Seizures Mother   . Glaucoma Mother   . CAD Father   . Heart disease Father   . Hypertension Father      Current Outpatient Medications:  .  acetaminophen (TYLENOL) 325 MG tablet, Take 650 mg by mouth every 6 (six) hours as needed for mild pain. , Disp: , Rfl:  .  amLODipine (NORVASC) 10 MG tablet, Take 10 mg by mouth daily., Disp: , Rfl:  .  budesonide (ENTOCORT EC) 3 MG 24 hr capsule, Take 3 capsules (9 mg total) by mouth daily. (Patient taking differently: Take 3 mg by mouth every other day.), Disp: 90 capsule, Rfl: 0 .  buPROPion (WELLBUTRIN SR)  100 MG 12 hr tablet, Take 200 mg by mouth daily., Disp: , Rfl:  .  Calcium 200 MG TABS, Take 200 mg by mouth daily., Disp: , Rfl:  .  cholecalciferol (VITAMIN D3) 25 MCG (1000 UT) tablet, Take 1,000 Units by mouth daily. , Disp: , Rfl:  .  cycloSPORINE (RESTASIS) 0.05 % ophthalmic emulsion, Place 1 drop into both eyes 2 (two) times daily., Disp: , Rfl:  .  denosumab (PROLIA) 60 MG/ML SOSY injection, Inject 60 mg into the skin every 6 (six) months., Disp: , Rfl:  .  dexlansoprazole (DEXILANT) 60 MG capsule, Take 1 capsule (60 mg total) by mouth daily., Disp: 90 capsule, Rfl: 1 .  Dextran 70-Hypromellose 0.1-0.3 % SOLN, Place 1 drop into both eyes 4 (four) times daily., Disp: , Rfl:  .  diphenoxylate-atropine (LOMOTIL) 2.5-0.025 MG tablet, TAKE 1 TABLET BY MOUTH 4 TIMES DAILY AS NEEDED FOR DIARRHEA OR  LOOSE  STOOLS (Patient taking differently: Take 1-2 tablets by mouth 4 (four) times daily as needed for diarrhea or loose stools.), Disp: 90 tablet, Rfl: 1 .  DULoxetine (CYMBALTA) 30 MG capsule, TAKE 3 CAPSULES BY MOUTH ONCE DAILY (Patient taking differently: Take 90 mg by mouth daily.), Disp: 90 capsule, Rfl: 0 .  estradiol (ESTRACE) 2 MG tablet, Take 1 tablet (2 mg total) by mouth daily., Disp: 90 tablet, Rfl: 1 .  famotidine (PEPCID) 20 MG tablet, Take 20 mg by mouth daily as needed for heartburn or indigestion., Disp: , Rfl:  .  GATTEX 5 MG KIT, Inject 5 mLs into the skin daily., Disp: , Rfl:  .  hydrALAZINE (APRESOLINE) 50 MG tablet, Take 75 mg by mouth 3 (three) times daily., Disp: , Rfl:  .  HYDROmorphone (DILAUDID) 4 MG tablet, Take by mouth., Disp: , Rfl:  .  levETIRAcetam (KEPPRA) 500 MG tablet, Take 500 mg by mouth 2 (two) times daily., Disp: , Rfl:  .  lipase/protease/amylase (CREON) 36000 UNITS CPEP capsule, Take 1 capsule (36,000 Units total) by mouth 3 (three) times daily as needed (with meals for digestion)., Disp: 180 capsule, Rfl: 0 .  loperamide (IMODIUM) 2 MG capsule, TAKE 1 CAPSULE  BY MOUTH AS NEEDED FOR DIARRHEA OR LOOSE STOOLS (Patient taking differently: Take 2 mg by mouth daily as needed for diarrhea or loose stools.),  Disp: 60 capsule, Rfl: 2 .  methadone (DOLOPHINE) 5 MG tablet, Take 1 tablet (5 mg total) by mouth 5 (five) times daily., Disp: 30 tablet, Rfl: 0 .  metoprolol tartrate (LOPRESSOR) 100 MG tablet, Take 1 tablet (100 mg total) by mouth 2 (two) times daily., Disp: 90 tablet, Rfl: 1 .  Multiple Vitamins-Minerals (MULTIVITAMIN ADULT PO), Take 1 tablet by mouth daily., Disp: , Rfl:  .  NARCAN 4 MG/0.1ML LIQD nasal spray kit, Place 1 spray into the nose once as needed (overdose)., Disp: , Rfl:  .  PRESCRIPTION MEDICATION, Inject 1 each into the vein daily. Home TPN . [facility] [place] . 1 bag for 12 hours. [phone], Disp: , Rfl:  .  Probiotic Product (PROBIOTIC-10 PO), Take 1 capsule by mouth daily. , Disp: , Rfl:  .  promethazine (PHENERGAN) 25 MG tablet, TAKE 1 TABLET BY MOUTH EVERY 6 HOURS AS NEEDED FOR NAUSEA FOR VOMITING, Disp: 90 tablet, Rfl: 0 .  sodium chloride 0.9 % infusion, Inject 1 mL into the vein daily as needed (flush)., Disp: , Rfl:  .  sucralfate (CARAFATE) 1 GM/10ML suspension, Take 10 mLs (1 g total) by mouth 4 (four) times daily -  with meals and at bedtime., Disp: 420 mL, Rfl: 2 .  Trace Minerals Cu-Mn-Se-Zn (TRALEMENT IV), Inject 1 mL into the vein See admin instructions. Used in TPN bag 4 times weekly, Disp: , Rfl:  .  vitamin B-12 (CYANOCOBALAMIN) 100 MCG tablet, Take 1,000 mcg by mouth daily. , Disp: , Rfl:   Review of Systems:  Constitutional: Denies fever, chills, diaphoresis, appetite change and fatigue.  HEENT: Denies photophobia, eye pain, redness, hearing loss, ear pain, congestion, sore throat, rhinorrhea, sneezing, mouth sores, trouble swallowing, neck pain, neck stiffness and tinnitus.   Respiratory: Denies SOB, DOE, cough, chest tightness,  and wheezing.   Cardiovascular: Denies chest pain,  palpitations. Gastrointestinal: Denies constipation, blood in stool and abdominal distention.  Genitourinary: Denies dysuria, urgency, frequency, hematuria, flank pain and difficulty urinating.  Endocrine: Denies: hot or cold intolerance, sweats, changes in hair or nails, polyuria, polydipsia. Musculoskeletal: Denies myalgias, back pain, joint swelling, arthralgias and gait problem.  Skin: Denies pallor, rash and wound.  Neurological: Denies dizziness, seizures, syncope, weakness, light-headedness, numbness and headaches.  Hematological: Denies adenopathy. Easy bruising, personal or family bleeding history  Psychiatric/Behavioral: Denies suicidal ideation, mood changes, confusion, nervousness, sleep disturbance and agitation    Physical Exam: Vitals:   05/02/21 1045  BP: (!) 142/62  Pulse: 70  Temp: 98.1 F (36.7 C)  TempSrc: Oral  SpO2: 99%  Weight: 148 lb 3.2 oz (67.2 kg)  Height: 5' 8"  (1.727 m)    Body mass index is 22.53 kg/m.   Constitutional: NAD, calm, comfortable Eyes: PERRL, lids and conjunctivae normal, wears corrective lenses ENMT: Mucous membranes are moist.  Respiratory: clear to auscultation bilaterally, no wheezing, no crackles. Normal respiratory effort. No accessory muscle use.  Cardiovascular: Regular rate and rhythm, no murmurs / rubs / gallops.  2+ pitting lower extremity edema bilaterally up to thighs.  Abdomen: no tenderness, no masses palpated. No hepatosplenomegaly. Bowel sounds positive.  Neurologic: Grossly intact and nonfocal Psychiatric: Normal judgment and insight. Alert and oriented x 3. Normal mood.    Impression and Plan:  Hospital discharge follow-up  Colitis  Crohn's disease of small & large intestine with [facility] charts have been reviewed in detail, medication reconciliation has been performed. -She has follow-up with her [facility] gastroenterologist later  this month. -She is scheduled for her thoracic MRI next week to follow-up on  her discitis after completion of antibiotic therapy. -She has an appointment in July for physical.  Time spent: 33 minutes reviewing chart, interviewing and examining patient and determining plan of care.    [name], MD [facility]

## 2021-05-06 ENCOUNTER — Emergency Department (HOSPITAL_COMMUNITY): Payer: Medicare Other

## 2021-05-06 ENCOUNTER — Other Ambulatory Visit: Payer: Self-pay

## 2021-05-06 ENCOUNTER — Encounter (HOSPITAL_COMMUNITY): Payer: Self-pay | Admitting: Emergency Medicine

## 2021-05-06 ENCOUNTER — Inpatient Hospital Stay (HOSPITAL_COMMUNITY)
Admission: EM | Admit: 2021-05-06 | Discharge: 2021-05-16 | DRG: 683 | Disposition: A | Discharge status: Home Health | Payer: Medicare Other | Attending: Family Medicine | Admitting: Family Medicine

## 2021-05-06 DIAGNOSIS — G894 Chronic pain syndrome: Secondary | ICD-10-CM | POA: Diagnosis present

## 2021-05-06 DIAGNOSIS — R509 Fever, unspecified: Secondary | ICD-10-CM

## 2021-05-06 DIAGNOSIS — D649 Anemia, unspecified: Secondary | ICD-10-CM | POA: Diagnosis not present

## 2021-05-06 DIAGNOSIS — E538 Deficiency of other specified B group vitamins: Secondary | ICD-10-CM | POA: Diagnosis present

## 2021-05-06 DIAGNOSIS — R7989 Other specified abnormal findings of blood chemistry: Secondary | ICD-10-CM | POA: Diagnosis present

## 2021-05-06 DIAGNOSIS — F32A Depression, unspecified: Secondary | ICD-10-CM | POA: Diagnosis present

## 2021-05-06 DIAGNOSIS — F419 Anxiety disorder, unspecified: Secondary | ICD-10-CM | POA: Diagnosis present

## 2021-05-06 DIAGNOSIS — R600 Localized edema: Secondary | ICD-10-CM | POA: Diagnosis present

## 2021-05-06 DIAGNOSIS — R300 Dysuria: Secondary | ICD-10-CM | POA: Diagnosis present

## 2021-05-06 DIAGNOSIS — R945 Abnormal results of liver function studies: Secondary | ICD-10-CM | POA: Diagnosis present

## 2021-05-06 DIAGNOSIS — K219 Gastro-esophageal reflux disease without esophagitis: Secondary | ICD-10-CM | POA: Diagnosis present

## 2021-05-06 DIAGNOSIS — A419 Sepsis, unspecified organism: Secondary | ICD-10-CM | POA: Diagnosis not present

## 2021-05-06 DIAGNOSIS — Z8249 Family history of ischemic heart disease and other diseases of the circulatory system: Secondary | ICD-10-CM

## 2021-05-06 DIAGNOSIS — Z885 Allergy status to narcotic agent status: Secondary | ICD-10-CM

## 2021-05-06 DIAGNOSIS — R6520 Severe sepsis without septic shock: Secondary | ICD-10-CM | POA: Diagnosis not present

## 2021-05-06 DIAGNOSIS — N189 Chronic kidney disease, unspecified: Secondary | ICD-10-CM | POA: Diagnosis not present

## 2021-05-06 DIAGNOSIS — Z888 Allergy status to other drugs, medicaments and biological substances status: Secondary | ICD-10-CM

## 2021-05-06 DIAGNOSIS — K5080 Crohn's disease of both small and large intestine without complications: Secondary | ICD-10-CM | POA: Diagnosis present

## 2021-05-06 DIAGNOSIS — K759 Inflammatory liver disease, unspecified: Secondary | ICD-10-CM | POA: Diagnosis not present

## 2021-05-06 DIAGNOSIS — R6510 Systemic inflammatory response syndrome (SIRS) of non-infectious origin without acute organ dysfunction: Secondary | ICD-10-CM | POA: Diagnosis present

## 2021-05-06 DIAGNOSIS — Z820 Family history of epilepsy and other diseases of the nervous system: Secondary | ICD-10-CM

## 2021-05-06 DIAGNOSIS — Z20822 Contact with and (suspected) exposure to covid-19: Secondary | ICD-10-CM | POA: Diagnosis present

## 2021-05-06 DIAGNOSIS — N179 Acute kidney failure, unspecified: Secondary | ICD-10-CM | POA: Diagnosis present

## 2021-05-06 DIAGNOSIS — K7200 Acute and subacute hepatic failure without coma: Secondary | ICD-10-CM | POA: Diagnosis not present

## 2021-05-06 DIAGNOSIS — E440 Moderate protein-calorie malnutrition: Secondary | ICD-10-CM | POA: Diagnosis present

## 2021-05-06 DIAGNOSIS — N1831 Chronic kidney disease, stage 3a: Secondary | ICD-10-CM | POA: Diagnosis present

## 2021-05-06 DIAGNOSIS — E876 Hypokalemia: Secondary | ICD-10-CM | POA: Diagnosis present

## 2021-05-06 DIAGNOSIS — E43 Unspecified severe protein-calorie malnutrition: Secondary | ICD-10-CM

## 2021-05-06 DIAGNOSIS — D638 Anemia in other chronic diseases classified elsewhere: Secondary | ICD-10-CM | POA: Diagnosis present

## 2021-05-06 DIAGNOSIS — K50818 Crohn's disease of both small and large intestine with other complication: Secondary | ICD-10-CM | POA: Diagnosis present

## 2021-05-06 DIAGNOSIS — D72819 Decreased white blood cell count, unspecified: Secondary | ICD-10-CM | POA: Diagnosis present

## 2021-05-06 DIAGNOSIS — K50819 Crohn's disease of both small and large intestine with unspecified complications: Secondary | ICD-10-CM | POA: Diagnosis present

## 2021-05-06 DIAGNOSIS — Z87891 Personal history of nicotine dependence: Secondary | ICD-10-CM

## 2021-05-06 DIAGNOSIS — K912 Postsurgical malabsorption, not elsewhere classified: Secondary | ICD-10-CM | POA: Diagnosis present

## 2021-05-06 DIAGNOSIS — I129 Hypertensive chronic kidney disease with stage 1 through stage 4 chronic kidney disease, or unspecified chronic kidney disease: Secondary | ICD-10-CM | POA: Diagnosis present

## 2021-05-06 DIAGNOSIS — Z79899 Other long term (current) drug therapy: Secondary | ICD-10-CM

## 2021-05-06 DIAGNOSIS — R7401 Elevation of levels of liver transaminase levels: Secondary | ICD-10-CM

## 2021-05-06 DIAGNOSIS — M7989 Other specified soft tissue disorders: Secondary | ICD-10-CM | POA: Diagnosis not present

## 2021-05-06 DIAGNOSIS — R748 Abnormal levels of other serum enzymes: Secondary | ICD-10-CM | POA: Diagnosis present

## 2021-05-06 DIAGNOSIS — Z832 Family history of diseases of the blood and blood-forming organs and certain disorders involving the immune mechanism: Secondary | ICD-10-CM

## 2021-05-06 DIAGNOSIS — I10 Essential (primary) hypertension: Secondary | ICD-10-CM | POA: Diagnosis present

## 2021-05-06 DIAGNOSIS — K8689 Other specified diseases of pancreas: Secondary | ICD-10-CM | POA: Diagnosis not present

## 2021-05-06 DIAGNOSIS — Z833 Family history of diabetes mellitus: Secondary | ICD-10-CM

## 2021-05-06 DIAGNOSIS — Z7952 Long term (current) use of systemic steroids: Secondary | ICD-10-CM

## 2021-05-06 LAB — CBC WITH DIFFERENTIAL/PLATELET
Abs Immature Granulocytes: 0.01 10*3/uL (ref 0.00–0.07)
Basophils Absolute: 0 10*3/uL (ref 0.0–0.1)
Basophils Relative: 0 %
Eosinophils Absolute: 0 10*3/uL (ref 0.0–0.5)
Eosinophils Relative: 1 %
HCT: 29.2 % — ABNORMAL LOW (ref 36.0–46.0)
Hemoglobin: 9.3 g/dL — ABNORMAL LOW (ref 12.0–15.0)
Immature Granulocytes: 1 %
Lymphocytes Relative: 10 %
Lymphs Abs: 0.2 10*3/uL — ABNORMAL LOW (ref 0.7–4.0)
MCH: 30.6 pg (ref 26.0–34.0)
MCHC: 31.8 g/dL (ref 30.0–36.0)
MCV: 96.1 fL (ref 80.0–100.0)
Monocytes Absolute: 0 10*3/uL — ABNORMAL LOW (ref 0.1–1.0)
Monocytes Relative: 1 %
Neutro Abs: 1.9 10*3/uL (ref 1.7–7.7)
Neutrophils Relative %: 87 %
Platelets: 179 10*3/uL (ref 150–400)
RBC: 3.04 MIL/uL — ABNORMAL LOW (ref 3.87–5.11)
RDW: 15 % (ref 11.5–15.5)
WBC: 2.1 10*3/uL — ABNORMAL LOW (ref 4.0–10.5)
nRBC: 0 % (ref 0.0–0.2)

## 2021-05-06 LAB — COMPREHENSIVE METABOLIC PANEL
ALT: 297 U/L — ABNORMAL HIGH (ref 0–44)
AST: 567 U/L — ABNORMAL HIGH (ref 15–41)
Albumin: 2.6 g/dL — ABNORMAL LOW (ref 3.5–5.0)
Alkaline Phosphatase: 148 U/L — ABNORMAL HIGH (ref 38–126)
Anion gap: 9 (ref 5–15)
BUN: 37 mg/dL — ABNORMAL HIGH (ref 6–20)
CO2: 22 mmol/L (ref 22–32)
Calcium: 8.4 mg/dL — ABNORMAL LOW (ref 8.9–10.3)
Chloride: 108 mmol/L (ref 98–111)
Creatinine, Ser: 1.55 mg/dL — ABNORMAL HIGH (ref 0.44–1.00)
GFR, Estimated: 38 mL/min — ABNORMAL LOW (ref >60)
Glucose, Bld: 84 mg/dL (ref 70–99)
Potassium: 3.7 mmol/L (ref 3.5–5.1)
Sodium: 139 mmol/L (ref 135–145)
Total Bilirubin: 0.5 mg/dL (ref 0.3–1.2)
Total Protein: 6.3 g/dL — ABNORMAL LOW (ref 6.5–8.1)

## 2021-05-06 LAB — URINALYSIS, ROUTINE W REFLEX MICROSCOPIC
Bacteria, UA: NONE SEEN
Bilirubin Urine: NEGATIVE
Glucose, UA: NEGATIVE mg/dL
Hgb urine dipstick: NEGATIVE
Ketones, ur: NEGATIVE mg/dL
Leukocytes,Ua: NEGATIVE
Nitrite: NEGATIVE
Protein, ur: >=300 mg/dL — AB
Specific Gravity, Urine: 1.014 (ref 1.005–1.030)
pH: 8 (ref 5.0–8.0)

## 2021-05-06 LAB — APTT: aPTT: 25 seconds (ref 24–36)

## 2021-05-06 LAB — LACTIC ACID, PLASMA: Lactic Acid, Venous: 0.7 mmol/L (ref 0.5–1.9)

## 2021-05-06 LAB — BRAIN NATRIURETIC PEPTIDE: B Natriuretic Peptide: 97.4 pg/mL (ref 0.0–100.0)

## 2021-05-06 LAB — RESP PANEL BY RT-PCR (FLU A&B, COVID) ARPGX2
Influenza A by PCR: NEGATIVE
Influenza B by PCR: NEGATIVE
SARS Coronavirus 2 by RT PCR: NEGATIVE

## 2021-05-06 LAB — PROTIME-INR
INR: 0.9 (ref 0.8–1.2)
Prothrombin Time: 12.6 seconds (ref 11.4–15.2)

## 2021-05-06 MED ORDER — ONDANSETRON HCL 4 MG/2ML IJ SOLN
4.0000 mg | Freq: Four times a day (QID) | INTRAMUSCULAR | Status: DC | PRN
  Administered 2021-05-07 – 2021-05-16 (×25): 4 mg via INTRAVENOUS
  Filled 2021-05-06 (×25): qty 2

## 2021-05-06 MED ORDER — LOPERAMIDE HCL 2 MG PO CAPS
2.0000 mg | ORAL_CAPSULE | Freq: Every day | ORAL | Status: DC | PRN
  Administered 2021-05-12 – 2021-05-14 (×2): 2 mg via ORAL
  Filled 2021-05-06 (×2): qty 1

## 2021-05-06 MED ORDER — METRONIDAZOLE 500 MG/100ML IV SOLN
500.0000 mg | Freq: Three times a day (TID) | INTRAVENOUS | Status: DC
  Administered 2021-05-07 – 2021-05-10 (×11): 500 mg via INTRAVENOUS
  Filled 2021-05-06 (×11): qty 100

## 2021-05-06 MED ORDER — NALOXONE HCL 4 MG/0.1ML NA LIQD
1.0000 | Freq: Once | NASAL | Status: DC | PRN
  Filled 2021-05-06: qty 8

## 2021-05-06 MED ORDER — METOPROLOL TARTRATE 50 MG PO TABS
100.0000 mg | ORAL_TABLET | Freq: Two times a day (BID) | ORAL | Status: DC
  Administered 2021-05-07 – 2021-05-12 (×10): 100 mg via ORAL
  Filled 2021-05-06 (×10): qty 2

## 2021-05-06 MED ORDER — HYDROMORPHONE HCL 1 MG/ML IJ SOLN
1.0000 mg | Freq: Once | INTRAMUSCULAR | Status: AC
  Administered 2021-05-06: 1 mg via INTRAVENOUS
  Filled 2021-05-06: qty 1

## 2021-05-06 MED ORDER — PANCRELIPASE (LIP-PROT-AMYL) 12000-38000 UNITS PO CPEP
36000.0000 [IU] | ORAL_CAPSULE | Freq: Three times a day (TID) | ORAL | Status: DC | PRN
  Administered 2021-05-11 – 2021-05-14 (×5): 36000 [IU] via ORAL
  Filled 2021-05-06 (×6): qty 3

## 2021-05-06 MED ORDER — VANCOMYCIN HCL 1250 MG/250ML IV SOLN: 1250.0000 mg | INTRAVENOUS | Status: DC

## 2021-05-06 MED ORDER — PANTOPRAZOLE SODIUM 40 MG PO TBEC
40.0000 mg | DELAYED_RELEASE_TABLET | Freq: Every day | ORAL | Status: DC
  Administered 2021-05-08 – 2021-05-16 (×9): 40 mg via ORAL
  Filled 2021-05-06 (×9): qty 1

## 2021-05-06 MED ORDER — ONDANSETRON HCL 4 MG PO TABS
4.0000 mg | ORAL_TABLET | Freq: Four times a day (QID) | ORAL | Status: DC | PRN
  Administered 2021-05-12: 4 mg via ORAL
  Filled 2021-05-06 (×2): qty 1

## 2021-05-06 MED ORDER — LACTATED RINGERS IV BOLUS
1000.0000 mL | Freq: Once | INTRAVENOUS | Status: AC
  Administered 2021-05-07: 1000 mL via INTRAVENOUS

## 2021-05-06 MED ORDER — VANCOMYCIN HCL 10 G IV SOLR
1500.0000 mg | Freq: Once | INTRAVENOUS | Status: AC
  Administered 2021-05-06: 1500 mg via INTRAVENOUS
  Filled 2021-05-06: qty 1500

## 2021-05-06 MED ORDER — ONDANSETRON HCL 4 MG/2ML IJ SOLN
4.0000 mg | Freq: Once | INTRAMUSCULAR | Status: AC
  Administered 2021-05-06: 4 mg via INTRAVENOUS
  Filled 2021-05-06: qty 2

## 2021-05-06 MED ORDER — ENOXAPARIN SODIUM 40 MG/0.4ML IJ SOSY
40.0000 mg | PREFILLED_SYRINGE | INTRAMUSCULAR | Status: DC
  Administered 2021-05-07 – 2021-05-16 (×10): 40 mg via SUBCUTANEOUS
  Filled 2021-05-06 (×10): qty 0.4

## 2021-05-06 MED ORDER — BUPROPION HCL ER (SR) 100 MG PO TB12
200.0000 mg | ORAL_TABLET | Freq: Every day | ORAL | Status: DC
  Administered 2021-05-09 – 2021-05-16 (×7): 200 mg via ORAL
  Filled 2021-05-06 (×10): qty 2

## 2021-05-06 MED ORDER — ACETAMINOPHEN 325 MG PO TABS
650.0000 mg | ORAL_TABLET | Freq: Four times a day (QID) | ORAL | Status: DC | PRN
Start: 2021-05-06 — End: 2021-05-17
  Administered 2021-05-07 – 2021-05-12 (×3): 650 mg via ORAL
  Filled 2021-05-06 (×4): qty 2

## 2021-05-06 MED ORDER — ACETAMINOPHEN 500 MG PO TABS
1000.0000 mg | ORAL_TABLET | Freq: Once | ORAL | Status: AC
  Administered 2021-05-06: 1000 mg via ORAL
  Filled 2021-05-06: qty 2

## 2021-05-06 MED ORDER — SUCRALFATE 1 GM/10ML PO SUSP
1.0000 g | Freq: Three times a day (TID) | ORAL | Status: DC
  Administered 2021-05-07 – 2021-05-16 (×34): 1 g via ORAL
  Filled 2021-05-06 (×35): qty 10

## 2021-05-06 MED ORDER — VANCOMYCIN HCL IN DEXTROSE 1-5 GM/200ML-% IV SOLN: 1000.0000 mg | Freq: Once | INTRAVENOUS | Status: DC

## 2021-05-06 MED ORDER — SODIUM CHLORIDE 0.9 % IV SOLN
2.0000 g | Freq: Two times a day (BID) | INTRAVENOUS | Status: DC
  Administered 2021-05-07 – 2021-05-10 (×7): 2 g via INTRAVENOUS
  Filled 2021-05-06 (×7): qty 2

## 2021-05-06 MED ORDER — ACETAMINOPHEN 650 MG RE SUPP: 650.0000 mg | Freq: Four times a day (QID) | RECTAL | Status: DC | PRN

## 2021-05-06 MED ORDER — AMLODIPINE BESYLATE 10 MG PO TABS
10.0000 mg | ORAL_TABLET | Freq: Every day | ORAL | Status: DC
  Administered 2021-05-07 – 2021-05-16 (×10): 10 mg via ORAL
  Filled 2021-05-06 (×10): qty 1

## 2021-05-06 MED ORDER — HYDROCODONE-ACETAMINOPHEN 5-325 MG PO TABS
1.0000 | ORAL_TABLET | ORAL | Status: DC | PRN
  Administered 2021-05-10: 2 via ORAL
  Filled 2021-05-06: qty 2

## 2021-05-06 MED ORDER — HYDROMORPHONE HCL 1 MG/ML IJ SOLN
1.0000 mg | INTRAMUSCULAR | Status: DC | PRN
  Administered 2021-05-07 – 2021-05-10 (×26): 1 mg via INTRAVENOUS
  Filled 2021-05-06 (×27): qty 1

## 2021-05-06 MED ORDER — HYDRALAZINE HCL 50 MG PO TABS
75.0000 mg | ORAL_TABLET | Freq: Three times a day (TID) | ORAL | Status: DC
  Administered 2021-05-07 – 2021-05-15 (×22): 75 mg via ORAL
  Filled 2021-05-06 (×23): qty 1

## 2021-05-06 MED ORDER — SODIUM CHLORIDE 0.9 % IV SOLN
2.0000 g | Freq: Once | INTRAVENOUS | Status: AC
  Administered 2021-05-06: 2 g via INTRAVENOUS
  Filled 2021-05-06: qty 2

## 2021-05-06 MED ORDER — FAMOTIDINE 20 MG PO TABS: 20.0000 mg | ORAL_TABLET | Freq: Every day | ORAL | Status: DC | PRN

## 2021-05-06 MED ORDER — BUDESONIDE 3 MG PO CPEP
3.0000 mg | ORAL_CAPSULE | ORAL | Status: DC
  Filled 2021-05-06: qty 1

## 2021-05-06 MED ORDER — DULOXETINE HCL 60 MG PO CPEP
90.0000 mg | ORAL_CAPSULE | Freq: Every day | ORAL | Status: DC
  Administered 2021-05-08 – 2021-05-16 (×8): 90 mg via ORAL
  Filled 2021-05-06 (×8): qty 1

## 2021-05-06 MED ORDER — LACTATED RINGERS IV SOLN
INTRAVENOUS | Status: AC
  Administered 2021-05-06: 150 mL/h via INTRAVENOUS

## 2021-05-06 MED ORDER — LEVETIRACETAM 500 MG PO TABS
500.0000 mg | ORAL_TABLET | Freq: Two times a day (BID) | ORAL | Status: DC
  Administered 2021-05-07 – 2021-05-16 (×18): 500 mg via ORAL
  Filled 2021-05-06 (×18): qty 1

## 2021-05-06 MED ORDER — HYDROMORPHONE HCL 1 MG/ML IJ SOLN
1.0000 mg | Freq: Once | INTRAMUSCULAR | Status: AC
Start: 2021-05-06 — End: 2021-05-06
  Administered 2021-05-06: 1 mg via INTRAVENOUS
  Filled 2021-05-06: qty 1

## 2021-05-06 MED ORDER — METRONIDAZOLE 500 MG/100ML IV SOLN
500.0000 mg | Freq: Once | INTRAVENOUS | Status: AC
  Administered 2021-05-06: 500 mg via INTRAVENOUS
  Filled 2021-05-06: qty 100

## 2021-05-06 NOTE — Progress Notes (Signed)
Pharmacy Antibiotic Note  Caitlin Peters is a 60 y.o. female admitted on 05/06/2021 with sepsis.  Patient received Vancomycin 1562m IV, Cefepime 2gm IV and Metronidazole 5068mIV x 1 dose each in the ED.  Upon admission, Pharmacy has been consulted for Vancomycin and Cefepime dosing.  Plan: Vancomycin 1250 mg IV Q 36 hrs. Goal AUC 400-550.  Expected AUC: 469.1    SCr used: 1.55 Cefepime 2gm IV q12h Metronidazole per MD Follow renal function F/U culture results and sensitivities  Height: 5' 8"  (172.7 cm) Weight: 67.2 kg (148 lb 3.2 oz) IBW/kg (Calculated) : 63.9  Temp (24hrs), Avg:100.6 F (38.1 C), Min:100.6 F (38.1 C), Max:100.6 F (38.1 C)  Recent Labs  Lab 05/06/21 2010  WBC 2.1*  CREATININE 1.55*  LATICACIDVEN 0.7    Estimated Creatinine Clearance: 38.9 mL/min (A) (by C-G formula based on SCr of 1.55 mg/dL (H)).    Allergies  Allergen Reactions   Meperidine Hives    Other reaction(s): GI Upset Due to Chrones    Hyoscyamine Hives and Swelling    Legs swelling   Disorientation   Cefepime Other (See Comments)    Neurotoxicity occurring in setting of AKI. Ceftriaxone tolerated during same admit   Gabapentin Other (See Comments)    unknown   Lyrica [Pregabalin] Other (See Comments)    unknown   Topamax [Topiramate] Other (See Comments)    unknown   Zosyn [Piperacillin Sod-Tazobactam So]     Patient reports it makes her vomit, her neck stiff, and her "heart feel funny"   Fentanyl Rash    Pt is allergic to fentanyl patch related to the glue (gives her a rash) Pt states she is NOT allergic to fentanyl IV medicine   Morphine And Related Rash    Antimicrobials this admission: 6/11 Vancomycin >>   6/11 Cefepime >>   6/11 Metronidazole >>  Dose adjustments this admission:   Microbiology results: 6/11 BCx:   6/11 UCx:     Thank you for allowing pharmacy to be a part of this patient's care.  PoEverette RankPharmD 05/06/2021 11:16 PM

## 2021-05-06 NOTE — ED Provider Notes (Signed)
[facility] DEPT Provider Note   CSN: [id number] Arrival date & time: 05/06/21  1848     History Chief Complaint  Patient presents with   Abdominal Pain    MEMPHIS CRESWELL is a 60 y.o. female.  Pt is a 60y/o female with mmp including Crohn's disease, CKD, multiple bowel resections in [place], on Budesonide followed by [facility] Dr. Domenica Fail, now with short gut syndrome, TPN dependent, chronic diffuse pain on methadone and PO Dilaudid, and moderate protein calorie malnutrition, who was last bacteremic with stretotrophamonas treated with levaquin after aki with bactrim who is presenting today with sudden onset of fever.  Patient reports that she had felt fairly normal earlier today and then when she got home she started feeling hot and flushed.  She is reporting her chronic pain is worse all over mostly in the abdomen going down into her legs.  She has had swelling in bilateral legs now for over a month after her last hospitalization.  She did see her doctor and she reported they told her to start walking and moving around more which she has been trying to do but the swelling has been persistent.  She continues to do her TPN feeds 12 hours a day but has decreased some of her fluid boluses because she was concerned about the fluid in her legs.  She denies any cough, congestion, chest pain or shortness of breath.  Today is the first fever she has had since October.  No recent medication changes.  She has taken all of her meds today.  She has bowel changes all the time and denies any significant new pain in her abdomen.  She did describe some mild dysuria today for the first time with a fever but has not been having urinary issues.   Abdominal Pain     Past Medical History:  Diagnosis Date   Acute pancreatitis 04/13/2020   Anasarca 10/2019   AVN (avascular necrosis of bone) ([place])    Cataract    Choledocholithiasis (sludge) s/p ERCP 10/2019 10/21/2020   Chronic pain syndrome     CKD (chronic kidney disease), stage III ([place])    Crohn disease ([place])    Crohn's disease of small & large intestine with SGS 1984   MCCAYLA SHIMADA is a 60 year old female with a history of Crohn's disease diagnosed in 59 (age 66), history of long-term steroid use with osteoporosis, S/P multiple bowel resections ([id number]) complicated by chronic back and abdominal pain, steatorrhea and short bowel syndrome. The patient has been left with ~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 attached to proximal transverse colon through rectum. She has been   Depression    Diverticulosis    GERD (gastroesophageal reflux disease)    HTN (hypertension)    IDA (iron deficiency anemia)    Malnutrition ([place])    Mass in chest    Osteoporosis    Osteoporosis 12/24/2014   Pancreatitis    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; 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; jejunal to transverse colon anastomosis Treated at [facility] SB lengthening to 165cm Dr Alene Mires, [facility]   Vitamin B12 deficiency     Patient Active Problem List   Diagnosis Date Noted   Acidosis 04/08/2021   Colitis 04/07/2021   Intractable nausea and vomiting 03/11/2021   Thoracic discitis 02/10/2021   Pressure injury of skin 12/18/2020  Bacteremia 12/17/2020   Seizures ([place]) 12/17/2020   Drug-seeking behavior 12/16/2020   Seizure ([place]) 12/15/2020   CKD (chronic kidney disease) stage 3, GFR 30-59 ml/min ([place]) 12/06/2020   COVID-19 virus infection 12/06/2020   Protein calorie malnutrition ([place]) 12/06/2020   Palpitations 11/22/2020   PAC (premature atrial contraction) 11/22/2020   History of central line-associated bloodstream infection (CLABSI) 10/21/2020   Nausea & vomiting 10/21/2020   Choledocholithiasis (sludge) s/p ERCP 10/2019 10/21/2020   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   Abdominal pain 98/33/8250   Acute metabolic encephalopathy     Hypomagnesemia    Partial small bowel obstruction ([name])    Bacteremia associated with intravascular line ([place]) 08/04/2020   Enterobacter sepsis ([place]) 07/22/2020   Anxiety 06/03/2020   Acute pancreatitis 04/13/2020   Infection due to Acinetobacter species 03/10/2020   Pancytopenia ([place]) 03/05/2020   Central line infection    Elevated liver enzymes 01/02/2020   Cholangitis    Anasarca 10/28/2019   Acute kidney injury superimposed on chronic kidney disease ([place]) 10/28/2019   Falls 10/28/2019   Malnutrition ([place])    Vitamin B12 deficiency    GERD (gastroesophageal reflux disease)    Chronic pain syndrome    Hypokalemia due to excessive gastrointestinal loss of potassium 10/13/2019   Fever    Hypokalemia 10/12/2019   Chronic diarrhea 10/12/2019   Dysuria 10/12/2019   Bilateral lower extremity edema 10/12/2019   AKI (acute kidney injury) ([place]) 10/12/2019   Fungemia 08/27/2019   IDA (iron deficiency anemia) 11/03/2018   Dilation of biliary tract 08/28/2018   Severe diarrhea 03/07/2018   LFTs abnormal 01/27/2018   GI tract obstruction ([place]) 01/27/2018   Gram-negative bacteremia 09/13/2017   HTN (hypertension), benign 12/02/2016   Intractable pain 12/02/2016   Anemia 12/02/2016   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   Avascular necrosis ([place]) 06/14/2015   Polyarthralgia 05/03/2015   On total parenteral nutrition (TPN) 12/30/2014   Osteoporosis 12/24/2014   Low back pain 12/16/2014   Vitamin D deficiency 12/16/2014   Short gut syndrome 07/15/2014   History of colonic diverticulitis 2014   Depression 07/24/2012   Gram-positive bacteremia 07/24/2012   Small bowel obstruction due to adhesions ([place]) 07/24/2012   Diarrhea 12/13/2011   Neuralgia and neuritis 06/01/2011   Myalgia and myositis 08/12/2003   Crohn's disease of small & large intestine with SGS 1984    Past Surgical History:  Procedure Laterality Date   ABDOMINAL ADHESION SURGERY  01/22/2018   APPENDECTOMY  1989    BILIARY DILATION  11/26/2019   Procedure: BILIARY DILATION;  Surgeon: Jackquline Denmark, MD;  Location: [facility];  Service: Endoscopy;;   BILIARY DILATION  03/08/2020   Procedure: BILIARY DILATION;  Surgeon: Irving Copas., MD;  Location: [facility];  Service: Gastroenterology;;   BIOPSY  03/08/2020   Procedure: BIOPSY;  Surgeon: Irving Copas., MD;  Location: [facility]COPY;  Service: Gastroenterology;;   CHEST WALL RESECTION     right thoracotomy,resection of chest mass with anterior rib and reconstruction using prosthetic mesh and video arthroscopy   CHOLECYSTECTOMY  01/22/2018   COLONOSCOPY  2019   ENTEROSTOMY CLOSURE  04/1999   ERCP N/A 11/26/2019   Procedure: ENDOSCOPIC RETROGRADE CHOLANGIOPANCREATOGRAPHY (ERCP);  Surgeon: Jackquline Denmark, MD;  Location: [facility];  Service: Endoscopy;  Laterality: N/A;   ERCP N/A 03/08/2020   Procedure: ENDOSCOPIC RETROGRADE CHOLANGIOPANCREATOGRAPHY (ERCP);  Surgeon: Irving Copas., MD;  Location: [facility];  Service: Gastroenterology;  Laterality: N/A;   ESOPHAGOGASTRODUODENOSCOPY N/A 03/08/2020   Procedure: ESOPHAGOGASTRODUODENOSCOPY (EGD);  Surgeon: Irving Copas., MD;  Location: [place];  Service: Gastroenterology;  Laterality: N/A;   EUS N/A 03/08/2020   Procedure: UPPER ENDOSCOPIC ULTRASOUND (EUS) LINEAR;  Surgeon: Irving Copas., MD;  Location: [facility];  Service: Gastroenterology;  Laterality: N/A;   ILEOCECETOMY  03/1999   ileocolon resection with abdominal stoma   ILEOSTOMY CLOSURE  2001   IR FLUORO GUIDE CV LINE LEFT  01/07/2020   IR FLUORO GUIDE CV LINE LEFT  03/09/2020   IR FLUORO GUIDE CV LINE LEFT  05/09/2020   IR FLUORO GUIDE CV LINE LEFT  07/20/2020   IR FLUORO GUIDE CV LINE RIGHT  08/05/2020   IR FLUORO GUIDE CV LINE RIGHT  04/03/2021   IR PTA VENOUS EXCEPT DIALYSIS CIRCUIT  01/07/2020   IR REMOVAL TUN CV CATH W/O FL  08/05/2020   IR US GUIDE VASC ACCESS LEFT     x 2 06/17/19 and  09/14/2019   IR US GUIDE VASC ACCESS RIGHT  08/05/2020   KNEE SURGERY     right knee    LAPAROSCOPIC SMALL BOWEL RESECTION  2009   2000-2009.  SB resections for Crohns Disease - now with Short gut   OMENTECTOMY  01/22/2018   PARTIAL HYSTERECTOMY  1984   with LSO   REMOVAL OF STONES  11/26/2019   Procedure: REMOVAL OF STONES;  Surgeon: Jackquline Denmark, MD;  Location: [facility];  Service: Endoscopy;;   REMOVAL OF STONES  03/08/2020   Procedure: REMOVAL OF STONES;  Surgeon: Irving Copas., MD;  Location[facility] ENDOSCOPY;  Service: Gastroenterology;;   SALPINGOOPHORECTOMY Left 1984   SALPINGOOPHORECTOMY Right 1990   SERIAL TRANSVERSE ENTEROPLASTY (STEP) - SMALL BOWEL LENGTHENING  01/22/2018   Dr Alene Mires, [facility] - SB length from 120 to 165cm    SMALL INTESTINE SURGERY  2002   SMALL INTESTINE SURGERY  2003   SPHINCTEROTOMY  11/26/2019   Procedure: SPHINCTEROTOMY;  Surgeon: Jackquline Denmark, MD;  Location: [facility];  Service: Endoscopy;;   TOTAL ABDOMINAL HYSTERECTOMY  1990   with RSO   UPPER GASTROINTESTINAL ENDOSCOPY       OB History   No obstetric history on file.     Family History  Problem Relation Age of Onset   Breast cancer Sister    Multiple sclerosis Sister    Diabetes Sister    Lupus Sister    Colon cancer Other    Crohn's disease Other    Seizures Mother    Glaucoma Mother    CAD Father    Heart disease Father    Hypertension Father     Social History   Tobacco Use   Smoking status: Former    Pack years: 0.00   Smokeless tobacco: Never  Vaping Use   Vaping Use: Never used  Substance Use Topics   Alcohol use: Not Currently   Drug use: Never    Home Medications Prior to Admission medications   Medication Sig Start Date End Date Taking? Authorizing Provider  acetaminophen (TYLENOL) 325 MG tablet Take 650 mg by mouth every 6 (six) hours as needed for mild pain.     [provider]  amLODipine (NORVASC) 10 MG tablet Take 10 mg  by mouth daily.    [provider]  budesonide (ENTOCORT EC) 3 MG 24 hr capsule Take 3 capsules (9 mg total) by mouth daily. Patient taking differently: Take 3 mg by mouth every other day. 03/06/21   [name], [name], MD  buPROPion [facility]  SR) 100 MG 12 hr tablet Take 200 mg by mouth daily. 03/02/21   [provider]  Calcium 200 MG TABS Take 200 mg by mouth daily.    [provider]  cholecalciferol (VITAMIN D3) 25 MCG (1000 UT) tablet Take 1,000 Units by mouth daily.     [provider]  cycloSPORINE (RESTASIS) 0.05 % ophthalmic emulsion Place 1 drop into both eyes 2 (two) times daily.    [provider]  denosumab (PROLIA) 60 MG/ML SOSY injection Inject 60 mg into the skin every 6 (six) months.    [provider]  dexlansoprazole (DEXILANT) 60 MG capsule Take 1 capsule (60 mg total) by mouth daily. 12/28/20   [name], [name], MD  Dextran 70-Hypromellose 0.1-0.3 % SOLN Place 1 drop into both eyes 4 (four) times daily.    [provider]  diphenoxylate-atropine (LOMOTIL) 2.5-0.025 MG tablet TAKE 1 TABLET BY MOUTH 4 TIMES DAILY AS NEEDED FOR DIARRHEA OR  LOOSE  STOOLS Patient taking differently: Take 1-2 tablets by mouth 4 (four) times daily as needed for diarrhea or loose stools. 12/28/20   [name], [name], MD  DULoxetine (CYMBALTA) 30 MG capsule TAKE 3 CAPSULES BY MOUTH ONCE DAILY Patient taking differently: Take 90 mg by mouth daily. 03/22/21   [name], [name], MD  estradiol (ESTRACE) 2 MG tablet Take 1 tablet (2 mg total) by mouth daily. 12/07/20   [name], [name], MD  famotidine (PEPCID) 20 MG tablet Take 20 mg by mouth daily as needed for heartburn or indigestion.    [provider]  GATTEX 5 MG KIT Inject 5 mLs into the skin daily. 01/03/21   [provider]  hydrALAZINE (APRESOLINE) 50 MG tablet Take 75 mg by mouth 3 (three) times daily.    [provider]   HYDROmorphone (DILAUDID) 4 MG tablet Take by mouth. 04/18/21   [provider]  levETIRAcetam (KEPPRA) 500 MG tablet Take 500 mg by mouth 2 (two) times daily.    [provider]  lipase/protease/amylase (CREON) 36000 UNITS CPEP capsule Take 1 capsule (36,000 Units total) by mouth 3 (three) times daily as needed (with meals for digestion). 06/28/20   [name], [name], MD  loperamide (IMODIUM) 2 MG capsule TAKE 1 CAPSULE BY MOUTH AS NEEDED FOR DIARRHEA OR LOOSE STOOLS Patient taking differently: Take 2 mg by mouth daily as needed for diarrhea or loose stools. 01/20/21   [name], [name], MD  methadone (DOLOPHINE) 5 MG tablet Take 1 tablet (5 mg total) by mouth 5 (five) times daily. 06/30/20   Florencia Reasons, MD  metoprolol tartrate (LOPRESSOR) 100 MG tablet Take 1 tablet (100 mg total) by mouth 2 (two) times daily. 02/21/21   [name], [name], MD  Multiple Vitamins-Minerals (MULTIVITAMIN ADULT PO) Take 1 tablet by mouth daily.    [provider]  NARCAN 4 MG/0.1ML LIQD nasal spray kit Place 1 spray into the nose once as needed (overdose). 10/14/20   [provider]  PRESCRIPTION MEDICATION Inject 1 each into the vein daily. Home TPN . [facility]  . 1 bag for 12 hours. [id number]    [provider]  Probiotic Product (PROBIOTIC-10 PO) Take 1 capsule by mouth daily.     [provider]  promethazine (PHENERGAN) 25 MG tablet TAKE 1 TABLET BY MOUTH EVERY 6 HOURS AS NEEDED FOR NAUSEA FOR VOMITING 04/21/21   [name], [name], MD  sodium  chloride 0.9 % infusion Inject 1 mL into the vein daily as needed (flush). 10/16/20   [provider]  sucralfate (CARAFATE) 1 GM/10ML suspension Take 10 mLs (1 g total) by mouth 4 (four) times daily -  with meals and at bedtime. 06/28/20   [name], [name], MD  Trace Minerals Cu-Mn-Se-Zn (TRALEMENT IV) Inject 1 mL into the vein See admin  instructions. Used in TPN bag 4 times weekly    [provider]  vitamin B-12 (CYANOCOBALAMIN) 100 MCG tablet Take 1,000 mcg by mouth daily.     [provider]    Allergies    Meperidine, Hyoscyamine, Cefepime, Gabapentin, Lyrica [pregabalin], Topamax [topiramate], Zosyn [piperacillin sod-tazobactam so], Fentanyl, and Morphine and related  Review of Systems   Review of Systems  Gastrointestinal:  Positive for abdominal pain.  All other systems reviewed and are negative.  Physical Exam Updated Vital Signs BP (!) 160/79 (BP Location: Left Arm)   Pulse 81   Temp (!) 100.6 F (38.1 C) (Oral)   Resp 19   Ht 5' 8"  (1.727 m)   Wt 67.2 kg   SpO2 97%   BMI 22.53 kg/m   Physical Exam Vitals and nursing note reviewed.  Constitutional:      General: She is not in acute distress.    Appearance: She is well-developed.     Comments: Appears uncomfortable  HENT:     Head: Normocephalic and atraumatic.     Mouth/Throat:     Mouth: Mucous membranes are moist.  Eyes:     Pupils: Pupils are equal, round, and reactive to light.  Cardiovascular:     Rate and Rhythm: Normal rate and regular rhythm.     Heart sounds: Normal heart sounds. No murmur heard.   No friction rub.  Pulmonary:     Effort: Pulmonary effort is normal.     Breath sounds: Normal breath sounds. No wheezing or rales.  Abdominal:     General: Bowel sounds are normal. There is no distension.     Palpations: Abdomen is soft.     Tenderness: There is abdominal tenderness. There is no guarding or rebound.     Comments: Supple well-healed surgical scars over the abdomen.  Abdomen is mildly tense and diffusely tender  Musculoskeletal:        General: No tenderness. Normal range of motion.     Cervical back: Normal range of motion and neck supple.     Right lower leg: Edema present.     Left lower leg: Edema present.     Comments: Edema noted in bilateral lower extremities and ankles/feet.  No erythema or  warmth.  PICC line present in the right femoral area without surrounding erythema or drainage  Skin:    General: Skin is warm and dry.     Findings: No rash.  Neurological:     Mental Status: She is alert and oriented to person, place, and time. Mental status is at baseline.     Cranial Nerves: No cranial nerve deficit.  Psychiatric:        Mood and Affect: Mood normal.        Behavior: Behavior normal.    ED Results / Procedures / Treatments   Labs (all labs ordered are listed, but only abnormal results are displayed) Labs Reviewed - No data to display  EKG None  Radiology No results found.  Procedures Procedures   Medications Ordered in ED Medications - No data to display  ED  Course  I have reviewed the triage vital signs and the nursing notes.  Pertinent labs & imaging results that were available during my care of the patient were reviewed by me and considered in my medical decision making (see chart for details).    MDM Rules/Calculators/A&P                         Patient is a chronically ill 60 year old female presenting today with a fever.  This is in light of being on TPN chronically with a PICC line.  Concern for bacteremia as patient has had this recurrently in the past.  No prior history of fungemia.  Patient has not been on antibiotics since October.  She has bowel movements ranging from solid to loose and that has not changed with low suspicion for C. difficile today.  She has mild diffuse abdominal pain but again nothing new or worse than her baseline.  PICC line site appears normal.  She has no neurologic symptoms however if fever continues patient is complaining of back pain and may need an MRI to rule out discitis or osteomyelitis of the back.  Patient was covered broadly.  Labs show a recurrent transaminitis with normal T bili, normal lactate and UA, negative COVID and flu, AKI with creatinine of 1.55 today from her baseline of 1 and CBC with leukopenia with  white count of 2.1 but stable hemoglobin of 9.3.  Patient was 100.6 upon arrival here and was given antipyretics.  Chest x-ray without acute findings and EKG without acute findings.  Will admit for septic work-up and following cultures.  She denies any chest pain or shortness of breath and low suspicion for PE at this time.  Patient was not given IV fluid bolus due to a normal lactate and was started on an LR rate.  MDM   Amount and/or Complexity of Data Reviewed Clinical lab tests: ordered and reviewed Tests in the radiology section of CPT: ordered and reviewed Tests in the medicine section of CPT: ordered and reviewed Independent visualization of images, tracings, or specimens: yes  Patient Progress Patient progress: stable    Final Clinical Impression(s) / ED Diagnoses Final diagnoses:  Fever of unknown origin  AKI (acute kidney injury) ([place])  Transaminitis    Rx / DC Orders ED Discharge Orders     None        Blanchie Dessert, MD 05/07/21 0003

## 2021-05-06 NOTE — Progress Notes (Signed)
A consult was received from an ED physician for Vancomycin and Cefepime per pharmacy dosing.  [name] profile has been reviewed for ht/wt/allergies/indication/available labs.    A one time order has been placed for Vancomycin 1512m IV and Cefepime 2g IV.  Further antibiotics/pharmacy consults should be ordered by admitting physician if indicated.                       Thank you, GLuiz Ochoa6/09/2021  7:54 PM

## 2021-05-06 NOTE — Progress Notes (Signed)
Sepsis tracking by [facility]

## 2021-05-06 NOTE — ED Triage Notes (Signed)
Patient coming from home with c/o lower abdominal, back and lower extremities 10/10. Patient has right femoral PICC line, no redness or tenderness to the PICC line site. Per ems given 4 Zofran and fentanyl 100 mcg.

## 2021-05-07 ENCOUNTER — Inpatient Hospital Stay (HOSPITAL_COMMUNITY): Payer: Medicare Other

## 2021-05-07 DIAGNOSIS — N189 Chronic kidney disease, unspecified: Secondary | ICD-10-CM

## 2021-05-07 DIAGNOSIS — R509 Fever, unspecified: Secondary | ICD-10-CM | POA: Diagnosis not present

## 2021-05-07 DIAGNOSIS — K219 Gastro-esophageal reflux disease without esophagitis: Secondary | ICD-10-CM | POA: Diagnosis not present

## 2021-05-07 DIAGNOSIS — M7989 Other specified soft tissue disorders: Secondary | ICD-10-CM

## 2021-05-07 DIAGNOSIS — A419 Sepsis, unspecified organism: Secondary | ICD-10-CM | POA: Diagnosis not present

## 2021-05-07 DIAGNOSIS — N179 Acute kidney failure, unspecified: Principal | ICD-10-CM

## 2021-05-07 DIAGNOSIS — K7200 Acute and subacute hepatic failure without coma: Secondary | ICD-10-CM

## 2021-05-07 DIAGNOSIS — K50819 Crohn's disease of both small and large intestine with unspecified complications: Secondary | ICD-10-CM

## 2021-05-07 DIAGNOSIS — R6520 Severe sepsis without septic shock: Secondary | ICD-10-CM

## 2021-05-07 DIAGNOSIS — R945 Abnormal results of liver function studies: Secondary | ICD-10-CM

## 2021-05-07 LAB — RENAL FUNCTION PANEL
Albumin: 2.4 g/dL — ABNORMAL LOW (ref 3.5–5.0)
Anion gap: 7 (ref 5–15)
BUN: 24 mg/dL — ABNORMAL HIGH (ref 6–20)
CO2: 21 mmol/L — ABNORMAL LOW (ref 22–32)
Calcium: 7.7 mg/dL — ABNORMAL LOW (ref 8.9–10.3)
Chloride: 110 mmol/L (ref 98–111)
Creatinine, Ser: 1.17 mg/dL — ABNORMAL HIGH (ref 0.44–1.00)
GFR, Estimated: 53 mL/min — ABNORMAL LOW (ref >60)
Glucose, Bld: 82 mg/dL (ref 70–99)
Phosphorus: 2.4 mg/dL — ABNORMAL LOW (ref 2.5–4.6)
Potassium: 3.8 mmol/L (ref 3.5–5.1)
Sodium: 138 mmol/L (ref 135–145)

## 2021-05-07 LAB — CBC WITH DIFFERENTIAL/PLATELET
Abs Immature Granulocytes: 0.02 10*3/uL (ref 0.00–0.07)
Basophils Absolute: 0 10*3/uL (ref 0.0–0.1)
Basophils Relative: 0 %
Eosinophils Absolute: 0.1 10*3/uL (ref 0.0–0.5)
Eosinophils Relative: 1 %
HCT: 27.3 % — ABNORMAL LOW (ref 36.0–46.0)
Hemoglobin: 8.7 g/dL — ABNORMAL LOW (ref 12.0–15.0)
Immature Granulocytes: 0 %
Lymphocytes Relative: 11 %
Lymphs Abs: 0.6 10*3/uL — ABNORMAL LOW (ref 0.7–4.0)
MCH: 30 pg (ref 26.0–34.0)
MCHC: 31.9 g/dL (ref 30.0–36.0)
MCV: 94.1 fL (ref 80.0–100.0)
Monocytes Absolute: 0.3 10*3/uL (ref 0.1–1.0)
Monocytes Relative: 6 %
Neutro Abs: 4.2 10*3/uL (ref 1.7–7.7)
Neutrophils Relative %: 82 %
Platelets: 129 10*3/uL — ABNORMAL LOW (ref 150–400)
RBC: 2.9 MIL/uL — ABNORMAL LOW (ref 3.87–5.11)
RDW: 14.9 % (ref 11.5–15.5)
WBC: 5.3 10*3/uL (ref 4.0–10.5)
nRBC: 0 % (ref 0.0–0.2)

## 2021-05-07 LAB — MAGNESIUM: Magnesium: 1.7 mg/dL (ref 1.7–2.4)

## 2021-05-07 MED ORDER — BUDESONIDE 3 MG PO CPEP
3.0000 mg | ORAL_CAPSULE | ORAL | Status: DC
  Administered 2021-05-08 – 2021-05-16 (×5): 3 mg via ORAL
  Filled 2021-05-07 (×5): qty 1

## 2021-05-07 MED ORDER — FENTANYL CITRATE (PF) 100 MCG/2ML IJ SOLN: 50.0000 ug | Freq: Once | INTRAMUSCULAR | Status: AC

## 2021-05-07 MED ORDER — VANCOMYCIN HCL 1250 MG/250ML IV SOLN
1250.0000 mg | INTRAVENOUS | Status: DC
  Administered 2021-05-07 – 2021-05-09 (×2): 1250 mg via INTRAVENOUS
  Filled 2021-05-07 (×3): qty 250

## 2021-05-07 MED ORDER — FENTANYL CITRATE (PF) 100 MCG/2ML IJ SOLN
INTRAMUSCULAR | Status: AC
  Administered 2021-05-07: 50 ug via INTRAVENOUS
  Filled 2021-05-07: qty 2

## 2021-05-07 MED ORDER — CHLORHEXIDINE GLUCONATE CLOTH 2 % EX PADS
6.0000 | MEDICATED_PAD | Freq: Every day | CUTANEOUS | Status: DC
  Administered 2021-05-07 – 2021-05-16 (×9): 6 via TOPICAL

## 2021-05-07 NOTE — H&P (Signed)
TRH H&P    Patient Demographics:    Caitlin Peters, is a 60 y.o. female  MRN: [id number]  DOB - 09-17-1961  Admit Date - 05/06/2021  Referring MD/NP/PA: [email or web address]  Outpatient Primary MD for the patient is [name], [name], MD  Patient coming from: Home  Chief complaint- fever   HPI:    Caitlin Peters  is a 60 y.o. female, with history of acute pancreatitis as, avascular necrosis of the bone, chronic pain syndrome, CKD stage III, Crohn's disease, GERD, hypertension, malnutrition, short gut syndrome, and more presents the ED for chief complaint of fever.  Patient reports that she noticed she was feeling feverish at 6 PM on the day of presentation.  She had been out at the swimming pool all day with her family, and when they arrived, 6 PM she felt rigors, chills, hot, flushed.  She reports that her whole body felt tight, sharp, and throbbing with pain.  It was worse in her abdomen, back, and legs.  She took her Dilaudid in the morning, but did not take any Dilaudid for the rest of the day.  She does take 4 mg of Dilaudid at home as needed.  She took Tylenol when she felt feverish.  She reports that at today's appointment she was drinking plenty of water and carrying around a jug of ice water.  She denies any cough, chest pain, dyspnea.  She admits to chronic diarrhea, but no change in her diarrhea.  She reports that she chronically has sticky, mucousy diarrhea.  Patient reports that her abdominal pain is her normal abdominal pain but this is worse in intensity.  She reports that she eats and uses TPN at home.  She has been 148 pounds her last discharge compared to 135 pounds today.  She reports she loses weight despite eating like a pig in her words.  She denies any vomiting.  She reports an active home life.  She has noticed lower extremity edema worse on the right than the left.  She has chronic dysuria as well.   Patient has no other complaints at this time.  Patient does not smoke, does drink alcohol occasionally, does not use illicit drugs.  She is vaccinated for COVID.  Patient is full code.  In the ED Temp 100.6, heart rate 81-87, respiratory rate 15-19, blood pressure 170/78, satting at 98% Leukopenia with a white blood cell count of 2.1, hemoglobin 9.3 Chemistry panel reveals an elevated creatinine of 1.5 Negative respiratory panel Blood cultures urine culture pending Chest x-ray shows no active disease Transaminitis with an AST of 567, and an ALT of 297 BNP 97.4 EKG shows a heart rate of 79, sinus rhythm, QTC 422 Initial lactate normal, repeat lactate pending Patient was given Tylenol, cefepime, Dilaudid for total of 3 mg, Flagyl, Zofran, vancomycin, and LR at 150 mils per hour Admission requested for further work-up and management of sepsis     Review of systems:    In addition to the HPI above,  Admits to fever and chills No Headache, No  changes with Vision or hearing, No problems swallowing food or Liquids, No Chest pain, Cough or Shortness of Breath, Admits to abdominal pain, no vomiting, no change in bowel habits  No Blood in stool or Urine, No dysuria, No new skin rashes or bruises, No new joints pains-aches,  No new weakness, tingling, numbness in any extremity, No recent weight gain or loss, No polyuria, polydypsia or polyphagia, No significant Mental Stressors.  All other systems reviewed and are negative.    Past History of the following :    Past Medical History:  Diagnosis Date   Acute pancreatitis 04/13/2020   Anasarca 10/2019   AVN (avascular necrosis of bone) ([name])    Cataract    Choledocholithiasis (sludge) s/p ERCP 10/2019 10/21/2020   Chronic pain syndrome    CKD (chronic kidney disease), stage III ([place])    Crohn disease ([place])    Crohn's disease of small & large intestine with SGS 1984   Caitlin Peters is a 60 year old female with a history of  Crohn's disease diagnosed in 53 (age 76), history of long-term steroid use with osteoporosis, S/P multiple bowel resections ([id number]) complicated by chronic back and abdominal pain, steatorrhea and short bowel syndrome. The patient has been left with ~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 attached to proximal transverse colon through rectum. 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; 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; jejunal to transverse colon anastomosis Treated at [facility] lengthening to 165cm Dr Alene Mires, [facility]   Vitamin B12 deficiency       Past Surgical History:  Procedure Laterality Date   ABDOMINAL ADHESION SURGERY  01/22/2018   [place]  11/26/2019   Procedure: BILIARY DILATION;  Surgeon: Jackquline Denmark, MD;  Location: [facility];  Service: Endoscopy;;   BILIARY DILATION  03/08/2020   Procedure: BILIARY DILATION;  Surgeon: Irving Copas., MD;  Location: [place];  Service: Gastroenterology;;   BIOPSY  03/08/2020   Procedure: BIOPSY;  Surgeon: Irving Copas., MD;  Location: [place];  Service: Gastroenterology;;   CHEST WALL RESECTION     right thoracotomy,resection of chest mass with anterior rib and reconstruction using prosthetic mesh and video arthroscopy   CHOLECYSTECTOMY  01/22/2018   COLONOSCOPY  2019   ENTEROSTOMY CLOSURE  04/1999   ERCP N/A 11/26/2019   Procedure: ENDOSCOPIC RETROGRADE CHOLANGIOPANCREATOGRAPHY (ERCP);  Surgeon: Jackquline Denmark, MD;  Location: [facility];  Service: Endoscopy;  Laterality: N/A;   ERCP N/A 03/08/2020   Procedure: ENDOSCOPIC RETROGRADE  CHOLANGIOPANCREATOGRAPHY (ERCP);  Surgeon: Irving Copas., MD;  Location: [place];  Service: Gastroenterology;  Laterality: N/A;   ESOPHAGOGASTRODUODENOSCOPY N/A 03/08/2020   Procedure: ESOPHAGOGASTRODUODENOSCOPY (EGD);  Surgeon: Irving Copas., MD;  Location: [place];  Service: Gastroenterology;  Laterality: N/A;   EUS N/A 03/08/2020   Procedure: UPPER ENDOSCOPIC ULTRASOUND (EUS) LINEAR;  Surgeon: Irving Copas., MD;  Location: [place];  Service: Gastroenterology;  Laterality: N/A;   ILEOCECETOMY  03/1999   ileocolon resection with abdominal stoma   ILEOSTOMY CLOSURE  2001  IR FLUORO GUIDE CV LINE LEFT  01/07/2020   IR FLUORO GUIDE CV LINE LEFT  03/09/2020   IR FLUORO GUIDE CV LINE LEFT  05/09/2020   IR FLUORO GUIDE CV LINE LEFT  07/20/2020   IR FLUORO GUIDE CV LINE RIGHT  08/05/2020   IR FLUORO GUIDE CV LINE RIGHT  04/03/2021   IR PTA VENOUS EXCEPT DIALYSIS CIRCUIT  01/07/2020   IR REMOVAL TUN CV CATH W/O FL  08/05/2020   IR US GUIDE VASC ACCESS LEFT     x 2 06/17/19 and 09/14/2019   IR US GUIDE VASC ACCESS RIGHT  08/05/2020   KNEE SURGERY     right knee    LAPAROSCOPIC SMALL BOWEL RESECTION  2009   2000-2009.  SB resections for Crohns Disease - now with Short gut   OMENTECTOMY  01/22/2018   PARTIAL HYSTERECTOMY  1984   with LSO   REMOVAL OF STONES  11/26/2019   Procedure: REMOVAL OF STONES;  Surgeon: Jackquline Denmark, MD;  [facility];  Service: Endoscopy;;   REMOVAL OF STONES  03/08/2020   Procedure: REMOVAL OF STONES;  Surgeon: Irving Copas., MD;  Location: [facility];  Service: Gastroenterology;;   SALPINGOOPHORECTOMY Left 1984   SALPINGOOPHORECTOMY Right 1990   SERIAL TRANSVERSE ENTEROPLASTY (STEP) - SMALL BOWEL LENGTHENING  01/22/2018   Dr Alene Mires, [facility] - SB length from 120 to 165cm    SMALL INTESTINE SURGERY  2002   SMALL INTESTINE SURGERY  2003   SPHINCTEROTOMY  11/26/2019   Procedure: SPHINCTEROTOMY;   Surgeon: Jackquline Denmark, MD;  Location[facility];  Service: Endoscopy;;   TOTAL ABDOMINAL HYSTERECTOMY  1990   with RSO   UPPER GASTROINTESTINAL ENDOSCOPY        Social History:      Social History   Tobacco Use   Smoking status: Former    Pack years: 0.00   Smokeless tobacco: Never  Substance Use Topics   Alcohol use: Not Currently       Family History :     Family History  Problem Relation Age of Onset   Breast cancer Sister    Multiple sclerosis Sister    Diabetes Sister    Lupus Sister    Colon cancer Other    Crohn's disease Other    Seizures Mother    Glaucoma Mother    CAD Father    Heart disease Father    Hypertension Father       Home Medications:   Prior to Admission medications   Medication Sig Start Date End Date Taking? Authorizing Provider  acetaminophen (TYLENOL) 325 MG tablet Take 650 mg by mouth every 6 (six) hours as needed for mild pain.    Yes [provider]  amLODipine (NORVASC) 10 MG tablet Take 10 mg by mouth daily.   Yes [provider]  budesonide (ENTOCORT EC) 3 MG 24 hr capsule Take 3 capsules (9 mg total) by mouth daily. Patient taking differently: Take 3 mg by mouth every other day. 03/06/21  Yes [name], [name], MD  buPROPion [facility] SR) 100 MG 12 hr tablet Take 200 mg by mouth daily. 03/02/21  Yes [provider]  Calcium 200 MG TABS Take 200 mg by mouth daily.   Yes [provider]  cholecalciferol (VITAMIN D3) 25 MCG (1000 UT) tablet Take 1,000 Units by mouth daily.    Yes [provider]  cycloSPORINE (RESTASIS) 0.05 % ophthalmic emulsion Place 1 drop into both eyes  2 (two) times daily.   Yes [provider]  denosumab (PROLIA) 60 MG/ML SOSY injection Inject 60 mg into the skin every 6 (six) months.   Yes [provider]  dexlansoprazole (DEXILANT) 60 MG capsule Take 1 capsule (60 mg total) by mouth daily. 12/28/20  Yes [name], [name], MD   Dextran 70-Hypromellose 0.1-0.3 % SOLN Place 1 drop into both eyes 4 (four) times daily.   Yes [provider]  diphenoxylate-atropine (LOMOTIL) 2.5-0.025 MG tablet TAKE 1 TABLET BY MOUTH 4 TIMES DAILY AS NEEDED FOR DIARRHEA OR  LOOSE  STOOLS Patient taking differently: Take 1-2 tablets by mouth 4 (four) times daily as needed for diarrhea or loose stools. 12/28/20  Yes [name], [name], MD  DULoxetine (CYMBALTA) 30 MG capsule TAKE 3 CAPSULES BY MOUTH ONCE DAILY Patient taking differently: Take 90 mg by mouth daily. 03/22/21  Yes [name], [name], MD  estradiol (ESTRACE) 2 MG tablet Take 1 tablet (2 mg total) by mouth daily. 12/07/20  Yes [name], [name], MD  famotidine (PEPCID) 20 MG tablet Take 20 mg by mouth daily as needed for heartburn or indigestion.   Yes [provider]  GATTEX 5 MG KIT Inject 5 mLs into the skin daily. 01/03/21  Yes [provider]  hydrALAZINE (APRESOLINE) 50 MG tablet Take 75 mg by mouth 3 (three) times daily.   Yes [provider]  HYDROmorphone (DILAUDID) 4 MG tablet Take 4 mg by mouth in the morning, at noon, in the evening, and at bedtime. 04/18/21  Yes [provider]  levETIRAcetam (KEPPRA) 500 MG tablet Take 500 mg by mouth 2 (two) times daily.   Yes [provider]  lipase/protease/amylase (CREON) 36000 UNITS CPEP capsule Take 1 capsule (36,000 Units total) by mouth 3 (three) times daily as needed (with meals for digestion). 06/28/20  Yes [name], [name], MD  loperamide (IMODIUM) 2 MG capsule TAKE 1 CAPSULE BY MOUTH AS NEEDED FOR DIARRHEA OR LOOSE STOOLS Patient taking differently: Take 2 mg by mouth daily as needed for diarrhea or loose stools. 01/20/21  Yes [name], [name], MD  methadone (DOLOPHINE) 5 MG tablet Take 1 tablet (5 mg total) by mouth 5 (five) times daily. Patient taking differently: Take 5 mg by mouth 4 (four) times daily as needed for severe pain or  moderate pain. 06/30/20  Yes Florencia Reasons, MD  metoprolol tartrate (LOPRESSOR) 100 MG tablet Take 1 tablet (100 mg total) by mouth 2 (two) times daily. 02/21/21  Yes [name], [name], MD  Multiple Vitamins-Minerals (MULTIVITAMIN ADULT PO) Take 1 tablet by mouth daily.   Yes [provider]  NARCAN 4 MG/0.1ML LIQD nasal spray kit Place 1 spray into the nose once as needed (overdose). 10/14/20  Yes [provider]  PRESCRIPTION MEDICATION Inject 1 each into the vein daily. Home TPN . [facility] [place] . 1 bag for 12 hours. [phone]   Yes [provider]  Probiotic Product (PROBIOTIC-10 PO) Take 1 capsule by mouth daily.    Yes [provider]  promethazine (PHENERGAN) 25 MG tablet TAKE 1 TABLET BY MOUTH EVERY 6 HOURS AS NEEDED FOR NAUSEA FOR VOMITING 04/21/21  Yes [name], [name], MD  sodium chloride 0.9 % infusion Inject 1 mL into the vein daily as needed (flush). 10/16/20  Yes [provider]  sucralfate (CARAFATE) 1 GM/10ML suspension Take 10 mLs (1 g total) by mouth 4 (four)  times daily -  with meals and at bedtime. 06/28/20  Yes [name], [name], MD  Trace Minerals Cu-Mn-Se-Zn (TRALEMENT IV) Inject 1 mL into the vein See admin instructions. Used in TPN bag 4 times weekly   Yes [provider]  vitamin B-12 (CYANOCOBALAMIN) 100 MCG tablet Take 1,000 mcg by mouth daily.    Yes [provider]     Allergies:     Allergies  Allergen Reactions   Meperidine Hives    Other reaction(s): GI Upset Due to [place]    Hyoscyamine Hives and Swelling    Legs swelling   Disorientation   Cefepime Other (See Comments)    Neurotoxicity occurring in setting of AKI. Ceftriaxone tolerated during same admit   Gabapentin Other (See Comments)    unknown   Lyrica [Pregabalin] Other (See Comments)    unknown   Topamax [Topiramate] Other (See Comments)    unknown   Zosyn [Piperacillin  Sod-Tazobactam So]     Patient reports it makes her vomit, her neck stiff, and her "heart feel funny"   Fentanyl Rash    Pt is allergic to fentanyl patch related to the glue (gives her a rash) Pt states she is NOT allergic to fentanyl IV medicine   Morphine And Related Rash     Physical Exam:   Vitals  Blood pressure 130/72, pulse 85, temperature (!) 100.4 F (38 C), resp. rate 20, height _0  (1.727 m), weight 67.2 kg, SpO2 100 %.  1.  General: Patient lying supine in bed, visibly in pain repositioning frequently   2. Psychiatric: Alert and oriented x 3, mood and behavior normal for situation, pleasant and cooperative with exam   3. Neurologic: Speech and language are normal, face is symmetric, moves all 4 extremities voluntarily, at baseline without acute deficits on limited exam   4. HEENMT:  Head is atraumatic, normocephalic, pupils reactive to light, neck is supple, trachea is midline, mucous membranes are moist   5. Respiratory : Lungs are clear to auscultation bilaterally without wheezing, rhonchi, rales, no cyanosis, no increase in work of breathing or accessory muscle use   6. Cardiovascular : Heart rate normal, rhythm is regular, no murmurs, rubs or gallops,  peripheral pulses palpated   7. Gastrointestinal:  Abdomen is soft, nondistended, diffusely tender to palpation, patient guarding, bowel sounds active, no masses or organomegaly palpated   8. Skin:  Skin is warm, dry and intact without rashes, acute lesions, but venous stasis changes present in the lower extremities bilaterally greater on right than left   9.Musculoskeletal:  No acute deformities or trauma, no asymmetry in tone, peripheral pulses palpated, no tenderness to palpation in the extremities     Data Review:    CBC Recent Labs  Lab 05/06/21 2010  WBC 2.1*  HGB 9.3*  HCT 29.2*  PLT 179  MCV 96.1  MCH 30.6  MCHC 31.8  RDW 15.0  LYMPHSABS 0.2*  MONOABS 0.0*  EOSABS 0.0  BASOSABS 0.0    ------------------------------------------------------------------------------------------------------------------  Results for orders placed or performed during the hospital encounter of 05/06/21 (from the past 48 hour(s))  Resp Panel by RT-PCR (Flu A&B, Covid) Nasopharyngeal Swab     Status: None   Collection Time: 05/06/21  8:10 PM   Specimen: Nasopharyngeal Swab; Nasopharyngeal(NP) swabs in vial transport medium  Result Value Ref Range   SARS Coronavirus 2 by RT PCR NEGATIVE NEGATIVE    Comment: (NOTE) SARS-CoV-2 target nucleic acids are NOT DETECTED.  The SARS-CoV-2  RNA is generally detectable in upper respiratory specimens during the acute phase of infection. The lowest concentration of SARS-CoV-2 viral copies this assay can detect is 138 copies/mL. A negative result does not preclude SARS-Cov-2 infection and should not be used as the sole basis for treatment or other patient management decisions. A negative result may occur with  improper specimen collection/handling, submission of specimen other than nasopharyngeal swab, presence of viral mutation(s) within the areas targeted by this assay, and inadequate number of viral copies(<138 copies/mL). A negative result must be combined with clinical observations, patient history, and epidemiological information. The expected result is Negative.  Fact Sheet for Patients:  EntrepreneurPulse.com.au  Fact Sheet for Healthcare Providers:  IncredibleEmployment.be  This test is no t yet approved or cleared by the [place] FDA and  has been authorized for detection and/or diagnosis of SARS-CoV-2 by FDA under an Emergency Use Authorization (EUA). This EUA will remain  in effect (meaning this test can be used) for the duration of the COVID-19 declaration under Section 564(b)(1) of the Act, 21 U.S.C.section 360bbb-3(b)(1), unless the authorization is terminated  or revoked sooner.        Influenza A by PCR NEGATIVE NEGATIVE   Influenza B by PCR NEGATIVE NEGATIVE    Comment: (NOTE) The Xpert Xpress SARS-CoV-2/FLU/RSV plus assay is intended as an aid in the diagnosis of influenza from Nasopharyngeal swab specimens and should not be used as a sole basis for treatment. Nasal washings and aspirates are unacceptable for Xpert Xpress SARS-CoV-2/FLU/RSV testing.  Fact Sheet for Patients: EntrepreneurPulse.com.au  Fact Sheet for Healthcare Providers: IncredibleEmployment.be  This test is not yet approved or cleared by the [place] FDA and has been authorized for detection and/or diagnosis of SARS-CoV-2 by FDA under an Emergency Use Authorization (EUA). This EUA will remain in effect (meaning this test can be used) for the duration of the COVID-19 declaration under Section 564(b)(1) of the Act, 21 U.S.C. section 360bbb-3(b)(1), unless the authorization is terminated or revoked.  Performed at [facility], [place] [address] [place], [place] [address]   Lactic acid, plasma     Status: None   Collection Time: 05/06/21  8:10 PM  Result Value Ref Range   Lactic Acid, Venous 0.7 0.5 - 1.9 mmol/L    Comment: Performed at [facility], [place] [address], [place], [place] [address]  Comprehensive metabolic panel     Status: Abnormal   Collection Time: 05/06/21  8:10 PM  Result Value Ref Range   Sodium 139 135 - 145 mmol/L   Potassium 3.7 3.5 - 5.1 mmol/L   Chloride 108 98 - 111 mmol/L   CO2 22 22 - 32 mmol/L   Glucose, Bld 84 70 - 99 mg/dL    Comment: Glucose reference range applies only to samples taken after fasting for at least 8 hours.   BUN 37 (H) 6 - 20 mg/dL   Creatinine, Ser 1.55 (H) 0.44 - 1.00 mg/dL   Calcium 8.4 (L) 8.9 - 10.3 mg/dL   Total Protein 6.3 (L) 6.5 - 8.1 g/dL   Albumin 2.6 (L) 3.5 - 5.0 g/dL   AST 567 (H) 15 - 41 U/L   ALT 297 (H) 0 - 44 U/L   Alkaline Phosphatase 148 (H) 38 -  126 U/L   Total Bilirubin 0.5 0.3 - 1.2 mg/dL   GFR, Estimated 38 (L) >60 mL/min    Comment: (NOTE) Calculated using the CKD-EPI Creatinine Equation (2021)    Anion gap  9 5 - 15    Comment: Performed at [facility], [place] [address], [place], [place] [address]  CBC WITH DIFFERENTIAL     Status: Abnormal   Collection Time: 05/06/21  8:10 PM  Result Value Ref Range   WBC 2.1 (L) 4.0 - 10.5 K/uL   RBC 3.04 (L) 3.87 - 5.11 MIL/uL   Hemoglobin 9.3 (L) 12.0 - 15.0 g/dL   HCT 29.2 (L) 36.0 - 46.0 %   MCV 96.1 80.0 - 100.0 fL   MCH 30.6 26.0 - 34.0 pg   MCHC 31.8 30.0 - 36.0 g/dL   RDW 15.0 11.5 - 15.5 %   Platelets 179 150 - 400 K/uL   nRBC 0.0 0.0 - 0.2 %   Neutrophils Relative % 87 %   Neutro Abs 1.9 1.7 - 7.7 K/uL   Lymphocytes Relative 10 %   Lymphs Abs 0.2 (L) 0.7 - 4.0 K/uL   Monocytes Relative 1 %   Monocytes Absolute 0.0 (L) 0.1 - 1.0 K/uL   Eosinophils Relative 1 %   Eosinophils Absolute 0.0 0.0 - 0.5 K/uL   Basophils Relative 0 %   Basophils Absolute 0.0 0.0 - 0.1 K/uL   Immature Granulocytes 1 %   Abs Immature Granulocytes 0.01 0.00 - 0.07 K/uL    Comment: Performed at [facility], [place] [address] [place], [place] [address]  Protime-INR     Status: None   Collection Time: 05/06/21  8:10 PM  Result Value Ref Range   Prothrombin Time 12.6 11.4 - 15.2 seconds   INR 0.9 0.8 - 1.2    Comment: (NOTE) INR goal varies based on device and disease states. Performed at [facility], [place] [address] [place], [place] [address]   APTT     Status: None   Collection Time: 05/06/21  8:10 PM  Result Value Ref Range   aPTT 25 24 - 36 seconds    Comment: Performed at [facility], [place] [address], [place], [place] [address]  Brain natriuretic peptide     Status: None   Collection Time: 05/06/21  8:10 PM  Result Value Ref Range   B Natriuretic Peptide 97.4 0.0 - 100.0 pg/mL    Comment: Performed at [facility], [place] [address], [place], [place] [address]  Urinalysis, Routine w reflex microscopic Urine, Catheterized     Status: Abnormal   Collection Time: 05/06/21  8:26 PM  Result Value Ref Range   Color, Urine YELLOW YELLOW   APPearance CLEAR CLEAR   Specific Gravity, Urine 1.014 1.005 - 1.030   pH 8.0 5.0 - 8.0   Glucose, UA NEGATIVE NEGATIVE mg/dL   Hgb urine dipstick NEGATIVE NEGATIVE   Bilirubin Urine NEGATIVE NEGATIVE   Ketones, ur NEGATIVE NEGATIVE mg/dL   Protein, ur >=300 (A) NEGATIVE mg/dL   Nitrite NEGATIVE NEGATIVE   Leukocytes,Ua NEGATIVE NEGATIVE   WBC, UA 0-5 0 - 5 WBC/hpf   Bacteria, UA NONE SEEN NONE SEEN    Comment: Performed at [place] [address] [place], [place] [address]    Chemistries  Recent Labs  Lab 05/06/21 2010  NA 139  K 3.7  CL 108  CO2 22  GLUCOSE 84  BUN 37*  CREATININE 1.55*  CALCIUM 8.4*  AST 567*  ALT 297*  ALKPHOS 148*  BILITOT 0.5   ------------------------------------------------------------------------------------------------------------------  ------------------------------------------------------------------------------------------------------------------ GFR: Estimated Creatinine Clearance: 38.9 mL/min (A) (by C-G formula based on SCr of  1.55 mg/dL (H)). Liver Function Tests: Recent Labs  Lab 05/06/21 2010  AST 567*  ALT 297*  ALKPHOS 148*  BILITOT 0.5  PROT 6.3*  ALBUMIN 2.6*   No results for input(s): LIPASE, AMYLASE in the last 168 hours. No results for input(s): AMMONIA in the last 168 hours. Coagulation Profile: Recent Labs  Lab 05/06/21 2010  INR 0.9   Cardiac Enzymes: No results for input(s): CKTOTAL, CKMB, CKMBINDEX, TROPONINI in the last 168 hours. BNP (last 3 results) No results for input(s): PROBNP in the last 8760 hours. HbA1C: No results for input(s): HGBA1C in the last 72 hours. CBG: No results for input(s): GLUCAP in the last 168 hours. Lipid  Profile: No results for input(s): CHOL, HDL, LDLCALC, TRIG, CHOLHDL, LDLDIRECT in the last 72 hours. Thyroid Function Tests: No results for input(s): TSH, T4TOTAL, FREET4, T3FREE, THYROIDAB in the last 72 hours. Anemia Panel: No results for input(s): VITAMINB12, FOLATE, FERRITIN, TIBC, IRON, RETICCTPCT in the last 72 hours.  --------------------------------------------------------------------------------------------------------------- Urine analysis:    Component Value Date/Time   COLORURINE YELLOW 05/06/2021 2026   APPEARANCEUR CLEAR 05/06/2021 2026   LABSPEC 1.014 05/06/2021 2026   PHURINE 8.0 05/06/2021 2026   GLUCOSEU NEGATIVE 05/06/2021 2026   HGBUR NEGATIVE 05/06/2021 2026   BILIRUBINUR NEGATIVE 05/06/2021 2026   KETONESUR NEGATIVE 05/06/2021 2026   PROTEINUR >=300 (A) 05/06/2021 2026   NITRITE NEGATIVE 05/06/2021 2026   LEUKOCYTESUR NEGATIVE 05/06/2021 2026      Imaging Results:    DG Chest Port 1 View  Result Date: 05/06/2021 CLINICAL DATA:  Questionable sepsis EXAM: PORTABLE CHEST 1 VIEW COMPARISON:  February 09, 2021 FINDINGS: The heart size borderline. The hila and mediastinum are unremarkable. No pneumothorax. No nodules or masses. No focal infiltrates. IMPRESSION: No acute abnormalities.  No cause for sepsis identified. Electronically Signed   By: Dorise Bullion III M.D   On: 05/06/2021 21:18      Assessment & Plan:    Principal Problem:   Sepsis ([place]) Active Problems:   Crohn's disease of small & large intestine with SGS   HTN (hypertension), benign   GERD (gastroesophageal reflux disease)   Acute kidney injury superimposed on chronic kidney disease ([name])   LFTs abnormal   Sepsis 2/2 unknown source Leukopenic at 2.1, fever of 100.6, liver failure with transaminitis, AKI with a bump in creatinine from 1.02-1.55 Initial lactic acid is normal, repeat pending UA pending Blood cultures pending Negative respiratory panel Chest x-ray shows no acute  abnormality Patient does have femoral line for TPN, reports it was replaced in May Urine culture pending Broad-spectrum antibiotics cefepime, bank, Flagyl started in the ED Continue cefepime, bank, Flagyl Previous micro has grown staph epidermidis, Acinetobacter, and stenotrophomonas all maltophilia AKI Creatinine has increased from 1.0-1.55 Patient reports that she has not been adherent to her fluid bolus regimen at home secondary to lower extremity edema Bolus with LR 1 L, continue fluids and 150 mL/h Avoid nephrotoxic agents when possible Continue to monitor Transaminitis AST 567, ALT 297 this is up from 11 and 20 respectively earlier this year Most likely secondary to TPN especially in dehydrated state Check acute hepatitis panel Trend in the a.m. If no improvement by a.m. consider right upper quadrant ultrasound Avoid nephrotoxic agents when possible Crohn's/short gut Continue Imodium as needed for chronic diarrhea Continue Entocort and Prolia Continue to monitor electrolytes and replace as needed Full liquid diet Continue to monitor Right lower extremity edema Ultrasound right lower extremity Hypertension Continue amlodipine, hydralazine, metoprolol GERD  Continue PPI Depression Continue Cymbalta and bupropion    DVT Prophylaxis-   Lovenox - SCDs   AM Labs Ordered, also please review Full Orders  Family Communication: No family at bedside Code Status: Full  Admission status: Inpatient :The appropriate admission status for this patient is INPATIENT. Inpatient status is judged to be reasonable and necessary in order to provide the required intensity of service to ensure the patient's safety. [name] presenting symptoms, physical exam findings, and initial radiographic and laboratory data in the context of their chronic comorbidities is felt to place them at high risk for further clinical deterioration. Furthermore, it is not anticipated that the patient will be  medically stable for discharge from the hospital within 2 midnights of admission. The following factors support the admission status of inpatient.     [name] presenting symptoms include fever. The worrisome physical exam findings include abdominal tenderness. The initial radiographic and laboratory data are worrisome because of leukopenia, transaminitis, AKI. The chronic co-morbidities include Crohn's disease, hypertension, GERD, depression.       * I certify that at the point of admission it is my clinical judgment that the patient will require inpatient hospital care spanning beyond 2 midnights from the point of admission due to high intensity of service, high risk for further deterioration and high frequency of surveillance required.*  Time spent in minutes : [place]

## 2021-05-07 NOTE — Progress Notes (Signed)
Lower extremity venous RT study completed.   Please see CV Proc for preliminary results.   Darlin Coco, RDMS, RVT

## 2021-05-07 NOTE — Progress Notes (Signed)
Pharmacy Antibiotic Note  Caitlin Peters is a 60 y.o. female admitted on 05/06/2021 with sepsis.  Upon admission, pharmacy has been consulted for Vancomycin and Cefepime dosing.  Plan: Adjust Vancomycin to 1271m IV q24h for improved renal function Vancomycin levels at steady state, as indicated Cefepime 2g IV q12h Metronidazole per MD Monitor renal function, cultures, clinical course Consider ID consult due to history of recurrent bacteremia   Height: 5' 8"  (172.7 cm) Weight: 67.2 kg (148 lb 3.2 oz) IBW/kg (Calculated) : 63.9  Temp (24hrs), Avg:99.3 F (37.4 C), Min:98.1 F (36.7 C), Max:100.6 F (38.1 C)  Recent Labs  Lab 05/06/21 2010 05/07/21 1210  WBC 2.1* 5.3  CREATININE 1.55* 1.17*  LATICACIDVEN 0.7  --      Estimated Creatinine Clearance: 51.6 mL/min (A) (by C-G formula based on SCr of 1.17 mg/dL (H)).    Allergies  Allergen Reactions   Meperidine Hives    Other reaction(s): GI Upset Due to Chrones    Hyoscyamine Hives and Swelling    Legs swelling   Disorientation   Cefepime Other (See Comments)    Neurotoxicity occurring in setting of AKI. Ceftriaxone tolerated during same admit   Gabapentin Other (See Comments)    unknown   Lyrica [Pregabalin] Other (See Comments)    unknown   Topamax [Topiramate] Other (See Comments)    unknown   Zosyn [Piperacillin Sod-Tazobactam So]     Patient reports it makes her vomit, her neck stiff, and her "heart feel funny"   Fentanyl Rash    Pt is allergic to fentanyl patch related to the glue (gives her a rash) Pt states she is NOT allergic to fentanyl IV medicine   Morphine And Related Rash    Antimicrobials this admission: 6/11 Vancomycin >>   6/11 Cefepime >>   6/11 Metronidazole >>  Microbiology results: 6/11 BCx:   6/11 UCx:     Thank you for allowing pharmacy to be a part of this patient's care.   JLindell Spar PharmD, BCPS Clinical Pharmacist  05/07/2021 1:56 PM

## 2021-05-07 NOTE — Progress Notes (Signed)
Pt has stated to the RN that today she is only taking her blood pressure Medications and her Kepra. Pt states "that's all I think I can keep down today" MD to be updated

## 2021-05-07 NOTE — Progress Notes (Signed)
Brief note: -Patient was admitted earlier today. -HPI as per H&P done earlier today: "Caitlin Peters  is a 60 y.o. female, with history of acute pancreatitis as, avascular necrosis of the bone, chronic pain syndrome, CKD stage III, Crohn's disease, GERD, hypertension, malnutrition, short gut syndrome, and more presents the ED for chief complaint of fever.  Patient reports that she noticed she was feeling feverish at 6 PM on the day of presentation.  She had been out at the swimming pool all day with her family, and when they arrived, 6 PM she felt rigors, chills, hot, flushed.  She reports that her whole body felt tight, sharp, and throbbing with pain.  It was worse in her abdomen, back, and legs.  She took her Dilaudid in the morning, but did not take any Dilaudid for the rest of the day.  She does take 4 mg of Dilaudid at home as needed.  She took Tylenol when she felt feverish.  She reports that at today's appointment she was drinking plenty of water and carrying around a jug of ice water.  She denies any cough, chest pain, dyspnea.  She admits to chronic diarrhea, but no change in her diarrhea.  She reports that she chronically has sticky, mucousy diarrhea.  Patient reports that her abdominal pain is her normal abdominal pain but this is worse in intensity.  She reports that she eats and uses TPN at home.  She has been 148 pounds her last discharge compared to 135 pounds today.  She reports she loses weight despite eating like a pig in her words.  She denies any vomiting.  She reports an active home life.  She has noticed lower extremity edema worse on the right than the left.  She has chronic dysuria as well.  Patient has no other complaints at this time.   Patient does not smoke, does drink alcohol occasionally, does not use illicit drugs.  She is vaccinated for COVID.  Patient is full code.   In the ED Temp 100.6, heart rate 81-87, respiratory rate 15-19, blood pressure 170/78, satting at  98% Leukopenia with a white blood cell count of 2.1, hemoglobin 9.3 Chemistry panel reveals an elevated creatinine of 1.5 Negative respiratory panel Blood cultures urine culture pending Chest x-ray shows no active disease Transaminitis with an AST of 567, and an ALT of 297 BNP 97.4 EKG shows a heart rate of 79, sinus rhythm, QTC 422 Initial lactate normal, repeat lactate pending Patient was given Tylenol, cefepime, Dilaudid for total of 3 mg, Flagyl, Zofran, vancomycin, and LR at 150 mils per hour Admission requested for further work-up and management of sepsis"  -Work-up is in progress. -Follow cultures. -Continue antibiotics for now. -No TPN for today.  Further management depend on hospital course.

## 2021-05-08 ENCOUNTER — Inpatient Hospital Stay (HOSPITAL_COMMUNITY): Payer: Medicare Other

## 2021-05-08 DIAGNOSIS — A419 Sepsis, unspecified organism: Secondary | ICD-10-CM | POA: Diagnosis not present

## 2021-05-08 DIAGNOSIS — N179 Acute kidney failure, unspecified: Secondary | ICD-10-CM | POA: Diagnosis not present

## 2021-05-08 DIAGNOSIS — K50819 Crohn's disease of both small and large intestine with unspecified complications: Secondary | ICD-10-CM | POA: Diagnosis not present

## 2021-05-08 DIAGNOSIS — R7989 Other specified abnormal findings of blood chemistry: Secondary | ICD-10-CM | POA: Diagnosis not present

## 2021-05-08 DIAGNOSIS — R509 Fever, unspecified: Secondary | ICD-10-CM | POA: Diagnosis not present

## 2021-05-08 DIAGNOSIS — K219 Gastro-esophageal reflux disease without esophagitis: Secondary | ICD-10-CM | POA: Diagnosis not present

## 2021-05-08 DIAGNOSIS — I10 Essential (primary) hypertension: Secondary | ICD-10-CM

## 2021-05-08 LAB — COMPREHENSIVE METABOLIC PANEL
ALT: 1115 U/L — ABNORMAL HIGH (ref 0–44)
AST: 588 U/L — ABNORMAL HIGH (ref 15–41)
Albumin: 2.5 g/dL — ABNORMAL LOW (ref 3.5–5.0)
Alkaline Phosphatase: 265 U/L — ABNORMAL HIGH (ref 38–126)
Anion gap: 9 (ref 5–15)
BUN: 16 mg/dL (ref 6–20)
CO2: 20 mmol/L — ABNORMAL LOW (ref 22–32)
Calcium: 8.3 mg/dL — ABNORMAL LOW (ref 8.9–10.3)
Chloride: 110 mmol/L (ref 98–111)
Creatinine, Ser: 1.23 mg/dL — ABNORMAL HIGH (ref 0.44–1.00)
GFR, Estimated: 50 mL/min — ABNORMAL LOW (ref >60)
Glucose, Bld: 104 mg/dL — ABNORMAL HIGH (ref 70–99)
Potassium: 3.6 mmol/L (ref 3.5–5.1)
Sodium: 139 mmol/L (ref 135–145)
Total Bilirubin: 0.7 mg/dL (ref 0.3–1.2)
Total Protein: 6.4 g/dL — ABNORMAL LOW (ref 6.5–8.1)

## 2021-05-08 LAB — PHOSPHORUS: Phosphorus: 2.1 mg/dL — ABNORMAL LOW (ref 2.5–4.6)

## 2021-05-08 LAB — CBC
HCT: 29.1 % — ABNORMAL LOW (ref 36.0–46.0)
Hemoglobin: 9.4 g/dL — ABNORMAL LOW (ref 12.0–15.0)
MCH: 30.4 pg (ref 26.0–34.0)
MCHC: 32.3 g/dL (ref 30.0–36.0)
MCV: 94.2 fL (ref 80.0–100.0)
Platelets: 147 10*3/uL — ABNORMAL LOW (ref 150–400)
RBC: 3.09 MIL/uL — ABNORMAL LOW (ref 3.87–5.11)
RDW: 15 % (ref 11.5–15.5)
WBC: 3.5 10*3/uL — ABNORMAL LOW (ref 4.0–10.5)
nRBC: 0 % (ref 0.0–0.2)

## 2021-05-08 LAB — CREATININE, SERUM
Creatinine, Ser: 1.32 mg/dL — ABNORMAL HIGH (ref 0.44–1.00)
GFR, Estimated: 46 mL/min — ABNORMAL LOW (ref >60)

## 2021-05-08 LAB — MAGNESIUM: Magnesium: 1.7 mg/dL (ref 1.7–2.4)

## 2021-05-08 LAB — URINE CULTURE: Culture: NO GROWTH

## 2021-05-08 MED ORDER — DIPHENOXYLATE-ATROPINE 2.5-0.025 MG PO TABS
1.0000 | ORAL_TABLET | Freq: Four times a day (QID) | ORAL | Status: DC | PRN
  Administered 2021-05-11 – 2021-05-14 (×2): 2 via ORAL
  Administered 2021-05-16 (×2): 1 via ORAL
  Filled 2021-05-08 (×2): qty 2
  Filled 2021-05-08 (×2): qty 1

## 2021-05-08 MED ORDER — FLORANEX PO PACK
PACK | Freq: Every day | ORAL | Status: DC
  Administered 2021-05-09 – 2021-05-15 (×5): 1 g via ORAL
  Filled 2021-05-08 (×9): qty 1

## 2021-05-08 MED ORDER — SODIUM CHLORIDE 0.9% FLUSH
10.0000 mL | Freq: Two times a day (BID) | INTRAVENOUS | Status: DC
Start: 2021-05-08 — End: 2021-05-17
  Administered 2021-05-08 – 2021-05-13 (×6): 10 mL

## 2021-05-08 MED ORDER — SODIUM CHLORIDE 0.9% FLUSH
10.0000 mL | INTRAVENOUS | Status: DC | PRN
  Administered 2021-05-09 – 2021-05-10 (×2): 10 mL

## 2021-05-08 MED ORDER — GADOBUTROL 1 MMOL/ML IV SOLN
6.5000 mL | Freq: Once | INTRAVENOUS | Status: AC | PRN
  Administered 2021-05-09: 6.5 mL via INTRAVENOUS

## 2021-05-08 MED ORDER — LORAZEPAM 2 MG/ML IJ SOLN: 0.5000 mg | Freq: Once | INTRAMUSCULAR | Status: AC

## 2021-05-08 MED ORDER — SODIUM CHLORIDE 0.9 % IV SOLN
12.5000 mg | Freq: Four times a day (QID) | INTRAVENOUS | Status: DC | PRN
  Administered 2021-05-08 – 2021-05-16 (×17): 12.5 mg via INTRAVENOUS
  Filled 2021-05-08 (×17): qty 12.5

## 2021-05-08 MED ORDER — LORAZEPAM 2 MG/ML IJ SOLN
INTRAMUSCULAR | Status: AC
  Administered 2021-05-08: 0.5 mg via INTRAVENOUS
  Filled 2021-05-08: qty 1

## 2021-05-08 NOTE — Consult Note (Signed)
Caitlin Peters for Infectious Disease  Total days of antibiotics 3/cefepime, metronidazole,vanco  Reason for Consult: fever with hx of femoral line, discitis    Referring Physician: pokhrel   Principal Problem:   [name] Active Problems:   Crohn's disease of small & large intestine with SGS   HTN (hypertension), benign   GERD (gastroesophageal reflux disease)   Acute kidney injury superimposed on chronic kidney disease ([name])   LFTs abnormal    HPI: Caitlin Peters is a 60 y.o. female with hx of crohn's, TPN dependent with chronic right femoral line. Hospitalized in mid march found to have thoracic discitis and paravertebral/ventral epidural phlegmon, treated with IV abtx thorugh end of April. She has episodic chills. But on 6/11, she had acute onset of high fevers up to 101F, with rigors. CXR did not suggest infiltrates, and her blood cx remain negative at 48hrs   Past Medical History:  Diagnosis Date   Acute pancreatitis 04/13/2020   Anasarca 10/2019   AVN (avascular necrosis of bone) ([place])    Cataract    Choledocholithiasis (sludge) s/p ERCP 10/2019 10/21/2020   Chronic pain syndrome    CKD (chronic kidney disease), stage III ([place])    Crohn disease ([place])    Crohn's disease of small & large intestine with SGS 1984   Caitlin Peters is a 60 year old female with a history of Crohn's disease diagnosed in 79 (age 26), history of long-term steroid use with osteoporosis, S/P multiple bowel resections ([id number]) complicated by chronic back and abdominal pain, steatorrhea and short bowel syndrome. The patient has been left with ~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 attached to proximal transverse colon through rectum. 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 Ceftriaxone tolerated during same admit   Gabapentin Other (See Comments)    unknown   Lyrica [Pregabalin] Other (See Comments)    unknown   Topamax [Topiramate] Other (See Comments)    unknown   Zosyn [Piperacillin Sod-Tazobactam So]     Patient reports it makes her vomit, her neck stiff, and her "heart feel funny"   Fentanyl Rash    Pt is allergic to fentanyl patch related to the glue (gives her a rash) Pt states she is NOT allergic to fentanyl IV medicine   Morphine And Related Rash     MEDICATIONS:  amLODipine  10 mg Oral Daily   budesonide  3 mg Oral QODAY   buPROPion  200 mg Oral Daily   Chlorhexidine Gluconate Cloth  6 each Topical Daily   DULoxetine  90 mg Oral Daily   enoxaparin (LOVENOX) injection  40 mg Subcutaneous Q24H   hydrALAZINE  75 mg Oral TID   lactobacillus   Oral Daily   levETIRAcetam  500 mg Oral BID   metoprolol tartrate  100 mg Oral BID   pantoprazole  40 mg Oral Daily   sodium chloride flush  10-40 mL Intracatheter Q12H   sucralfate  1 g Oral TID WC & HS    Social History   Tobacco Use   Smoking status: Former    Pack years: 0.00   Smokeless tobacco: Never  Vaping Use   Vaping Use: Never used  Substance Use Topics   Alcohol use: Not Currently   Drug use: Never    Family History  Problem Relation Age of Onset   Breast cancer Sister    Multiple sclerosis Sister    Diabetes Sister    Lupus Sister     Colon cancer Other    Crohn's disease Other    Seizures Mother    Glaucoma Mother    CAD Father    Heart disease Father    Hypertension Father     Review of Systems  Constitutional: Negative for fever, chills, diaphoresis, activity change, appetite change, fatigue and unexpected weight change.  HENT: Negative for congestion, sore throat, rhinorrhea, sneezing, trouble swallowing and sinus pressure.  Eyes: Negative for photophobia and visual disturbance.  Respiratory: Negative for cough, chest tightness, shortness of breath, wheezing and stridor.  Cardiovascular: Negative for chest pain, palpitations and leg swelling.  Gastrointestinal: +abdominal pain. Some diarrhea. Negative for nausea, vomiting, abdominal pain, diarrhea, constipation, blood in stool, abdominal distention and anal bleeding.  Genitourinary: Negative for dysuria, hematuria, flank pain and difficulty urinating.  Musculoskeletal: +back pain. Negative for myalgias, back pain, joint swelling, arthralgias and gait problem.  Skin: Negative for color change, pallor, rash and wound.  Neurological: Negative for dizziness, tremors, weakness and light-headedness.  Hematological: Negative for adenopathy. Does not bruise/bleed easily.  Psychiatric/Behavioral: Negative for behavioral problems, confusion, sleep disturbance, dysphoric mood, decreased concentration and agitation.    OBJECTIVE: Temp:  [98.1 F (36.7 C)-98.7 F (37.1 C)] 98.1 F (36.7 C) (06/13 1401) Pulse Rate:  [64-66] 64 (06/13 1401) Resp:  [15-18] 15 (06/13 1401) BP: (149-169)/(66-77) 149/66 (06/13 1401) SpO2:  [99 %-100 %] 99 % (06/13 1401) Physical Exam  Constitutional:  oriented to person, place, and time. appears well-developed and well-nourished. No distress.  HENT: [name]/AT, PERRLA, no scleral icterus Mouth/Throat: Oropharynx is clear and moist. No oropharyngeal exudate.  Cardiovascular: Normal rate, regular rhythm and normal heart sounds. Exam reveals no  gallop and no friction rub.  No murmur heard.  Pulmonary/Chest: Effort normal and breath sounds normal. No respiratory distress.  has no wheezes.  Neck = supple, no nuchal rigidity Abdominal: Soft. Bowel sounds are normal.  exhibits no distension. There is no tenderness. Ostomy+ BSW:HQPRF femoral line is c/d/i Lymphadenopathy: no cervical adenopathy. No axillary adenopathy FMB:WGYKZ edema Neurological: alert and oriented to person, place, and time.  Skin: Skin is warm and dry. No rash noted. No erythema.  Psychiatric: a normal mood and affect.  behavior is normal.    LABS: Results for orders placed or performed during the hospital encounter of 05/06/21 (from the past 48 hour(s))  Resp Panel by RT-PCR (Flu A&B, Covid) Nasopharyngeal Swab     Status: None   Collection Time: 05/06/21  8:10 PM   Specimen: Nasopharyngeal Swab; Nasopharyngeal(NP) swabs in vial transport medium  Result Value Ref Range   SARS Coronavirus 2 by RT PCR NEGATIVE NEGATIVE  Comment: (NOTE) SARS-CoV-2 target nucleic acids are NOT DETECTED.  The SARS-CoV-2 RNA is generally detectable in upper respiratory specimens during the acute phase of infection. The lowest concentration of SARS-CoV-2 viral copies this assay can detect is 138 copies/mL. A negative result does not preclude SARS-Cov-2 infection and should not be used as the sole basis for treatment or other patient management decisions. A negative result may occur with  improper specimen collection/handling, submission of specimen other than nasopharyngeal swab, presence of viral mutation(s) within the areas targeted by this assay, and inadequate number of viral copies(<138 copies/mL). A negative result must be combined with clinical observations, patient history, and epidemiological information. The expected result is Negative.  Fact Sheet for Patients:  EntrepreneurPulse.com.au  Fact Sheet for Healthcare Providers:   IncredibleEmployment.be  This test is no t yet approved or cleared by the [place] FDA and  has been authorized for detection and/or diagnosis of SARS-CoV-2 by FDA under an Emergency Use Authorization (EUA). This EUA will remain  in effect (meaning this test can be used) for the duration of the COVID-19 declaration under Section 564(b)(1) of the Act, 21 U.S.C.section 360bbb-3(b)(1), unless the authorization is terminated  or revoked sooner.       Influenza A by PCR NEGATIVE NEGATIVE   Influenza B by PCR NEGATIVE NEGATIVE    Comment: (NOTE) The Xpert Xpress SARS-CoV-2/FLU/RSV plus assay is intended as an aid in the diagnosis of influenza from Nasopharyngeal swab specimens and should not be used as a sole basis for treatment. Nasal washings and aspirates are unacceptable for Xpert Xpress SARS-CoV-2/FLU/RSV testing.  Fact Sheet for Patients: EntrepreneurPulse.com.au  Fact Sheet for Healthcare Providers: IncredibleEmployment.be  This test is not yet approved or cleared by the [place] FDA and has been authorized for detection and/or diagnosis of SARS-CoV-2 by FDA under an Emergency Use Authorization (EUA). This EUA will remain in effect (meaning this test can be used) for the duration of the COVID-19 declaration under Section 564(b)(1) of the Act, 21 U.S.C. section 360bbb-3(b)(1), unless the authorization is terminated or revoked.  Performed at [facility], [place] [address] [place], [place] [address]   Lactic acid, plasma     Status: None   Collection Time: 05/06/21  8:10 PM  Result Value Ref Range   Lactic Acid, Venous 0.7 0.5 - 1.9 mmol/L    Comment: Performed at [facility], [place] [address] [place], [place] [address]  Comprehensive metabolic panel     Status: Abnormal   Collection Time: 05/06/21  8:10 PM  Result Value Ref Range   Sodium 139 135 - 145 mmol/L   Potassium  3.7 3.5 - 5.1 mmol/L   Chloride 108 98 - 111 mmol/L   CO2 22 22 - 32 mmol/L   Glucose, Bld 84 70 - 99 mg/dL    Comment: Glucose reference range applies only to samples taken after fasting for at least 8 hours.   BUN 37 (H) 6 - 20 mg/dL   Creatinine, Ser 1.55 (H) 0.44 - 1.00 mg/dL   Calcium 8.4 (L) 8.9 - 10.3 mg/dL   Total Protein 6.3 (L) 6.5 - 8.1 g/dL   Albumin 2.6 (L) 3.5 - 5.0 g/dL   AST 567 (H) 15 - 41 U/L   ALT 297 (H) 0 - 44 U/L   Alkaline Phosphatase 148 (H) 38 - 126 U/L   Total Bilirubin 0.5 0.3 - 1.2 mg/dL   GFR, Estimated 38 (L) >60 mL/min    Comment: (NOTE)  Calculated using the CKD-EPI Creatinine Equation (2021)    Anion gap 9 5 - 15    Comment: Performed at [facility], [place] [address], [place], [place] [address]  CBC WITH DIFFERENTIAL     Status: Abnormal   Collection Time: 05/06/21  8:10 PM  Result Value Ref Range   WBC 2.1 (L) 4.0 - 10.5 K/uL   RBC 3.04 (L) 3.87 - 5.11 MIL/uL   Hemoglobin 9.3 (L) 12.0 - 15.0 g/dL   HCT 29.2 (L) 36.0 - 46.0 %   MCV 96.1 80.0 - 100.0 fL   MCH 30.6 26.0 - 34.0 pg   MCHC 31.8 30.0 - 36.0 g/dL   RDW 15.0 11.5 - 15.5 %   Platelets 179 150 - 400 K/uL   nRBC 0.0 0.0 - 0.2 %   Neutrophils Relative % 87 %   Neutro Abs 1.9 1.7 - 7.7 K/uL   Lymphocytes Relative 10 %   Lymphs Abs 0.2 (L) 0.7 - 4.0 K/uL   Monocytes Relative 1 %   Monocytes Absolute 0.0 (L) 0.1 - 1.0 K/uL   Eosinophils Relative 1 %   Eosinophils Absolute 0.0 0.0 - 0.5 K/uL   Basophils Relative 0 %   Basophils Absolute 0.0 0.0 - 0.1 K/uL   Immature Granulocytes 1 %   Abs Immature Granulocytes 0.01 0.00 - 0.07 K/uL    Comment: Performed at [facility], [place] [address], [place], [place] [address]  Protime-INR     Status: None   Collection Time: 05/06/21  8:10 PM  Result Value Ref Range   Prothrombin Time 12.6 11.4 - 15.2 seconds   INR 0.9 0.8 - 1.2    Comment: (NOTE) INR goal varies based on device and disease states. Performed  at [facility], [place] [address] [place], [place] [address]   APTT     Status: None   Collection Time: 05/06/21  8:10 PM  Result Value Ref Range   aPTT 25 24 - 36 seconds    Comment: Performed at [facility], [place] [address], [place], [place] [address]  Brain natriuretic peptide     Status: None   Collection Time: 05/06/21  8:10 PM  Result Value Ref Range   B Natriuretic Peptide 97.4 0.0 - 100.0 pg/mL    Comment: Performed at [facility], [place] [address], [place], [place] [address]  Blood Culture (routine x 2)     Status: None (Preliminary result)   Collection Time: 05/06/21  8:25 PM   Specimen: BLOOD  Result Value Ref Range   Specimen Description      BLOOD PICC LINE FEMORAL ARTERY Performed at [place] [address], [place], [place] [address]    Special Requests      BOTTLES DRAWN AEROBIC AND ANAEROBIC Blood Culture adequate volume Performed at [place] [address], [place], [place] [address]    Culture      NO GROWTH 1 DAY Performed at [place] [address] [place],  [address]    Report Status PENDING   Blood Culture (routine x 2)     Status: None (Preliminary result)   Collection Time: 05/06/21  8:26 PM   Specimen: BLOOD LEFT ARM  Result Value Ref Range   Specimen Description      BLOOD LEFT ARM Performed at [place] [address], [place],  [address]    Special Requests  BOTTLES DRAWN AEROBIC AND ANAEROBIC Blood Culture results may not be optimal due to an inadequate volume of blood received in culture bottles Performed at [facility], [place] [address], [place], [place] [address]    Culture      NO GROWTH 1 DAY Performed at [place] [address] [place], [place] [address]    Report Status PENDING   Urinalysis, Routine w reflex microscopic Urine, Catheterized     Status: Abnormal    Collection Time: 05/06/21  8:26 PM  Result Value Ref Range   Color, Urine YELLOW YELLOW   APPearance CLEAR CLEAR   Specific Gravity, Urine 1.014 1.005 - 1.030   pH 8.0 5.0 - 8.0   Glucose, UA NEGATIVE NEGATIVE mg/dL   Hgb urine dipstick NEGATIVE NEGATIVE   Bilirubin Urine NEGATIVE NEGATIVE   Ketones, ur NEGATIVE NEGATIVE mg/dL   Protein, ur >=300 (A) NEGATIVE mg/dL   Nitrite NEGATIVE NEGATIVE   Leukocytes,Ua NEGATIVE NEGATIVE   WBC, UA 0-5 0 - 5 WBC/hpf   Bacteria, UA NONE SEEN NONE SEEN    Comment: Performed at [facility] [address], [place], [place] [address]  Urine culture     Status: None   Collection Time: 05/06/21  8:26 PM   Specimen: In/Out Cath Urine  Result Value Ref Range   Specimen Description      IN/OUT CATH URINE Performed at [facility], [place] [address], [place], [place] [address]    Special Requests      NONE Performed at [facility], [place] [address], [place], [place] [address]    Culture      NO GROWTH Performed at [facility], [place] [address], [place], [place] [address]    Report Status 05/08/2021 FINAL   CBC with Differential/Platelet     Status: Abnormal   Collection Time: 05/07/21 12:10 PM  Result Value Ref Range   WBC 5.3 4.0 - 10.5 K/uL   RBC 2.90 (L) 3.87 - 5.11 MIL/uL   Hemoglobin 8.7 (L) 12.0 - 15.0 g/dL   HCT 27.3 (L) 36.0 - 46.0 %   MCV 94.1 80.0 - 100.0 fL   MCH 30.0 26.0 - 34.0 pg   MCHC 31.9 30.0 - 36.0 g/dL   RDW 14.9 11.5 - 15.5 %   Platelets 129 (L) 150 - 400 K/uL   nRBC 0.0 0.0 - 0.2 %   Neutrophils Relative % 82 %   Neutro Abs 4.2 1.7 - 7.7 K/uL   Lymphocytes Relative 11 %   Lymphs Abs 0.6 (L) 0.7 - 4.0 K/uL   Monocytes Relative 6 %   Monocytes Absolute 0.3 0.1 - 1.0 K/uL   Eosinophils Relative 1 %   Eosinophils Absolute 0.1 0.0 - 0.5 K/uL   Basophils Relative 0 %   Basophils Absolute 0.0 0.0 - 0.1 K/uL   Immature Granulocytes 0 %   Abs Immature  Granulocytes 0.02 0.00 - 0.07 K/uL    Comment: Performed at [facility], [place] [address], [place],  [address]  Renal function panel     Status: Abnormal   Collection Time: 05/07/21 12:10 PM  Result Value Ref Range   Sodium 138 135 - 145 mmol/L   Potassium 3.8 3.5 - 5.1 mmol/L   Chloride 110 98 - 111 mmol/L   CO2 21 (L) 22 - 32 mmol/L   Glucose, Bld 82 70 - 99 mg/dL    Comment: Glucose reference range  applies only to samples taken after fasting for at least 8 hours.   BUN 24 (H) 6 - 20 mg/dL   Creatinine, Ser 1.17 (H) 0.44 - 1.00 mg/dL   Calcium 7.7 (L) 8.9 - 10.3 mg/dL   Phosphorus 2.4 (L) 2.5 - 4.6 mg/dL   Albumin 2.4 (L) 3.5 - 5.0 g/dL   GFR, Estimated 53 (L) >60 mL/min    Comment: (NOTE) Calculated using the CKD-EPI Creatinine Equation (2021)    Anion gap 7 5 - 15    Comment: Performed at [facility], [place] [address], [place], [place] [address]  Magnesium     Status: None   Collection Time: 05/07/21 12:10 PM  Result Value Ref Range   Magnesium 1.7 1.7 - 2.4 mg/dL    Comment: Performed at [facility], [place] [address], [place], [place] [address]  Creatinine, serum     Status: Abnormal   Collection Time: 05/08/21  5:00 AM  Result Value Ref Range   Creatinine, Ser 1.32 (H) 0.44 - 1.00 mg/dL   GFR, Estimated 46 (L) >60 mL/min    Comment: (NOTE) Calculated using the CKD-EPI Creatinine Equation (2021) Performed at [facility] [place], [place] [address], [place], [place] [address]   Comprehensive metabolic panel     Status: Abnormal   Collection Time: 05/08/21 11:29 AM  Result Value Ref Range   Sodium 139 135 - 145 mmol/L   Potassium 3.6 3.5 - 5.1 mmol/L   Chloride 110 98 - 111 mmol/L   CO2 20 (L) 22 - 32 mmol/L   Glucose, Bld 104 (H) 70 - 99 mg/dL    Comment: Glucose reference range applies only to samples taken after fasting for at least 8 hours.   BUN 16 6 - 20 mg/dL   Creatinine, Ser 1.23 (H) 0.44 -  1.00 mg/dL   Calcium 8.3 (L) 8.9 - 10.3 mg/dL   Total Protein 6.4 (L) 6.5 - 8.1 g/dL   Albumin 2.5 (L) 3.5 - 5.0 g/dL   AST 588 (H) 15 - 41 U/L   ALT 1,115 (H) 0 - 44 U/L   Alkaline Phosphatase 265 (H) 38 - 126 U/L   Total Bilirubin 0.7 0.3 - 1.2 mg/dL   GFR, Estimated 50 (L) >60 mL/min    Comment: (NOTE) Calculated using the CKD-EPI Creatinine Equation (2021)    Anion gap 9 5 - 15    Comment: Performed at [facility], [place] [address] [place], [place] [address]  CBC     Status: Abnormal   Collection Time: 05/08/21 11:29 AM  Result Value Ref Range   WBC 3.5 (L) 4.0 - 10.5 K/uL   RBC 3.09 (L) 3.87 - 5.11 MIL/uL   Hemoglobin 9.4 (L) 12.0 - 15.0 g/dL   HCT 29.1 (L) 36.0 - 46.0 %   MCV 94.2 80.0 - 100.0 fL   MCH 30.4 26.0 - 34.0 pg   MCHC 32.3 30.0 - 36.0 g/dL   RDW 15.0 11.5 - 15.5 %   Platelets 147 (L) 150 - 400 K/uL   nRBC 0.0 0.0 - 0.2 %    Comment: Performed at [facility], [place] [address] [place], [place] [address]  Magnesium     Status: None   Collection Time: 05/08/21 11:29 AM  Result Value Ref Range   Magnesium 1.7 1.7 - 2.4 mg/dL    Comment: Performed at [facility], [place] [address] [place], [place] [address]  Phosphorus  Status: Abnormal   Collection Time: 05/08/21 11:29 AM  Result Value Ref Range   Phosphorus 2.1 (L) 2.5 - 4.6 mg/dL    Comment: Performed at [facility], [place] [address], [place],  [address]    MICRO:  IMAGING: DG Chest Port 1 View  Result Date: 05/06/2021 CLINICAL DATA:  Questionable sepsis EXAM: PORTABLE CHEST 1 VIEW COMPARISON:  February 09, 2021 FINDINGS: The heart size borderline. The hila and mediastinum are unremarkable. No pneumothorax. No nodules or masses. No focal infiltrates. IMPRESSION: No acute abnormalities.  No cause for sepsis identified. Electronically Signed   By: Dorise Bullion III M.D   On: 05/06/2021 21:18   VAS [place] LOWER EXTREMITY VENOUS  (DVT)  Result Date: 05/08/2021  Lower Venous DVT Study Patient Name:  Caitlin Peters  Date of Exam:   05/07/2021 Medical Rec #: [id number]         Accession #:    [id number] Date of Birth: Jun 21, 1961         Patient Gender: F Patient Age:   60Y Exam Location:  [facility] Procedure:      VAS [place] LOWER EXTREMITY VENOUS (DVT) Referring Phys: [id number] ASIA B B[place] --------------------------------------------------------------------------------  Indications: PT reports intermittent swelling RT foot. Has PICC RT thigh.  Limitations: Bandaging around PICC. Comparison Study: 11-08-2020 Prior RT lower extremity venous study was negative                   for DVT. Performing Technologist: Darlin Coco RDMS,RVT  Examination Guidelines: A complete evaluation includes B-mode imaging, spectral Doppler, color Doppler, and power Doppler as needed of all accessible portions of each vessel. Bilateral testing is considered an integral part of a complete examination. Limited examinations for reoccurring indications may be performed as noted. The reflux portion of the exam is performed with the patient in reverse Trendelenburg.  +---------+---------------+---------+-----------+----------+--------------+ RIGHT    CompressibilityPhasicitySpontaneityPropertiesThrombus Aging +---------+---------------+---------+-----------+----------+--------------+ CFV      Full           Yes      Yes                                 +---------+---------------+---------+-----------+----------+--------------+ SFJ      Full                                                        +---------+---------------+---------+-----------+----------+--------------+ FV Prox  Full                                                        +---------+---------------+---------+-----------+----------+--------------+ FV Mid   Full                                                         +---------+---------------+---------+-----------+----------+--------------+ FV DistalFull                                                        +---------+---------------+---------+-----------+----------+--------------+  PFV      Full                                                        +---------+---------------+---------+-----------+----------+--------------+ POP      Full           Yes      Yes                                 +---------+---------------+---------+-----------+----------+--------------+ PTV      Full                                                        +---------+---------------+---------+-----------+----------+--------------+ PERO     Full                                                        +---------+---------------+---------+-----------+----------+--------------+ Gastroc  Full                                                        +---------+---------------+---------+-----------+----------+--------------+   +----+---------------+---------+-----------+----------+--------------+ LEFTCompressibilityPhasicitySpontaneityPropertiesThrombus Aging +----+---------------+---------+-----------+----------+--------------+ CFV Full           Yes      Yes                                 +----+---------------+---------+-----------+----------+--------------+    Summary: RIGHT: - There is no evidence of deep vein thrombosis in the lower extremity.  - No cystic structure found in the popliteal fossa. - Limited visualization of PICC at proximal, lateral thigh secondary to bandaging.  LEFT: - No evidence of common femoral vein obstruction.  *See table(s) above for measurements and observations. Electronically signed by Jamelle Haring on 05/08/2021 at 10:51:59 AM.    Final    Lab Results  Component Value Date   ESRSEDRATE 101 (H) 02/09/2021     Assessment/Plan:  59yo F with hx of thoracic discitis, treated with IV abtx in the spring, now with  intermittent fever  - recommend to repeat MRI thoracic and lumbar spine to see changes from prior study in mid march - continue on broad spectrum abtx for the time being - will check sed rate and crp

## 2021-05-08 NOTE — Progress Notes (Signed)
PROGRESS NOTE  DION PARROW PPJ:[id number] DOB: Oct 21, 1961 DOA: 05/06/2021 PCP: [name], [name], MD   LOS: 2 days   Brief narrative:  Caitlin Peters  is a 60 y.o. female with history of acute pancreatitis, avascular necrosis of the bone, chronic pain syndrome, CKD stage III, Crohn's disease, GERD, hypertension, malnutrition, short gut syndrome, presented to hospital with fever with abdominal pain nausea.  Patient has chronic mucousy diarrhea at baseline.  Has been losing weight despite eating and being on TPN.  In the ED patient was noted to be febrile with a temperature of 100.53F.  She did have leukopenia with white blood cell count of 2.1.  Patient was noted to have elevated creatinine and liver function test was also elevated.  Culture and urine cultures were sent from the ED and patient was admitted to the hospital for further evaluation and treatment.  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  Blood cultures urine culture pending.  Patient does have a femoral line for TPN and was replaced in May.  Currently on broad-spectrum antibiotic including cefepime Flagyl and vancomycin.  Previous micro has grown staph epidermidis, Acinetobacter, and stenotrophomonas all maltophilia  Acute kidney injury. Creatinine has increased from 1.0-1.55.  Patient states that she has not been adherent to her fluid bolus at home.  Continue IV fluid at 150 mill per hour.  Continue to monitor closely.  Transaminitis AST 567, ALT 297 this is up from 11 and 20 respectively earlier this year.  Check hepatitis panel.  Possibly secondary to infection, TPN.    Crohn's/short gut  syndrome. Continue Imodium and antimotility agents.  Continue Entocort and Prolia.  Continue full liquid diet for now.  Right lower extremity edema Duplex ultrasound of the lower extremities showed no evidence of DVT.  Essential hypertension Continue amlodipine, hydralazine, metoprolol  GERD Continue PPI  Depression Continue Cymbalta and bupropion  DVT prophylaxis: enoxaparin (LOVENOX) injection 40 mg Start: 05/07/21 1000 SCDs Start: 05/06/21 2347   Code Status: Full code  Family Communication: None  Status is: Inpatient  Remains inpatient appropriate because:IV treatments appropriate due to intensity of illness or inability to take PO and Inpatient level of care appropriate due to severity of illness  Dispo: The patient is from: Home              Anticipated d/c is to: Home              Patient currently is not medically stable to d/c.   Difficult to place patient No   Consultants: Infectious disease  Procedures: None  Anti-infectives:  Vancomycin cefepime metronidazole  Anti-infectives (From admission, onward)    Start     Dose/Rate Route Frequency Ordered Stop   05/08/21 0800  vancomycin (VANCOREADY) IVPB 1250 mg/250 mL  Status:  Discontinued        1,250 mg 166.7 mL/hr over 90 Minutes Intravenous Every 36 hours 05/06/21 2321 05/07/21 1358   05/07/21 2200  vancomycin (VANCOREADY) IVPB 1250 mg/250 mL        1,250 mg 166.7 mL/hr over 90 Minutes Intravenous Every 24 hours 05/07/21 1358     05/07/21 1000  ceFEPIme (MAXIPIME) 2 g in sodium chloride  0.9 % 100 mL IVPB        2 g 200 mL/hr over 30 Minutes Intravenous Every 12 hours 05/06/21 2321     05/07/21 0500  metroNIDAZOLE (FLAGYL) IVPB 500 mg        500 mg 100 mL/hr over 60 Minutes Intravenous Every 8 hours 05/06/21 2346     05/06/21 2000  ceFEPIme (MAXIPIME) 2 g in sodium chloride 0.9 % 100 mL IVPB        2 g 200 mL/hr over 30 Minutes Intravenous  Once 05/06/21 1949 05/06/21 2216   05/06/21 2000   metroNIDAZOLE (FLAGYL) IVPB 500 mg        500 mg 100 mL/hr over 60 Minutes Intravenous  Once 05/06/21 1949 05/06/21 2124   05/06/21 2000  vancomycin (VANCOCIN) IVPB 1000 mg/200 mL premix  Status:  Discontinued        1,000 mg 200 mL/hr over 60 Minutes Intravenous  Once 05/06/21 1949 05/06/21 1953   05/06/21 2000  vancomycin (VANCOCIN) 1,500 mg in sodium chloride 0.9 % 500 mL IVPB        1,500 mg 250 mL/hr over 120 Minutes Intravenous  Once 05/06/21 1953 05/06/21 2249        Subjective: Today, patient was seen and examined at bedside.  Patient complains of abdominal pain and ongoing diarrhea, nausea.  Objective: Vitals:   05/07/21 2111 05/08/21 0506  BP: (!) 166/68 (!) 169/77  Pulse: 66 66  Resp: 18 18  Temp: 98.7 F (37.1 C) 98.5 F (36.9 C)  SpO2: 100% 100%    Intake/Output Summary (Last 24 hours) at 05/08/2021 1317 Last data filed at 05/08/2021 0457 Gross per 24 hour  Intake 1810 ml  Output --  Net 1810 ml   Filed Weights   05/06/21 1926  Weight: 67.2 kg   Body mass index is 22.53 kg/m.   Physical Exam: GENERAL: Patient is alert awake and oriented. Not in obvious distress. HENT: No scleral pallor or icterus. Pupils equally reactive to light. Oral mucosa is moist NECK: is supple, no gross swelling noted. CHEST: Clear to auscultation. No crackles or wheezes.  Diminished breath sounds bilaterally. CVS: S1 and S2 heard, no murmur. Regular rate and rhythm.  ABDOMEN: Soft, non-tender, bowel sounds are present.  Bowel sounds present.  Midline scar noted. EXTREMITIES: No edema.  Right femoral line in place without induration PICC line. CNS: Cranial nerves are intact. No focal motor deficits. SKIN: warm and dry without rashes.  Data Review: I have personally reviewed the following laboratory data and studies,  CBC: Recent Labs  Lab 05/06/21 2010 05/07/21 1210 05/08/21 1129  WBC 2.1* 5.3 3.5*  NEUTROABS 1.9 4.2  --   HGB 9.3* 8.7* 9.4*  HCT 29.2* 27.3* 29.1*   MCV 96.1 94.1 94.2  PLT 179 129* 761*   Basic Metabolic Panel: Recent Labs  Lab 05/06/21 2010 05/07/21 1210 05/08/21 0500  NA 139 138  --   K 3.7 3.8  --   CL 108 110  --   CO2 22 21*  --   GLUCOSE 84 82  --   BUN 37* 24*  --   CREATININE 1.55* 1.17* 1.32*  CALCIUM 8.4* 7.7*  --   MG  --  1.7  --   PHOS  --  2.4*  --    Liver Function Tests: Recent Labs  Lab 05/06/21 2010 05/07/21 1210  AST 567*  --   ALT 297*  --   ALKPHOS 148*  --  BILITOT 0.5  --   PROT 6.3*  --   ALBUMIN 2.6* 2.4*   No results for input(s): LIPASE, AMYLASE in the last 168 hours. No results for input(s): AMMONIA in the last 168 hours. Cardiac Enzymes: No results for input(s): CKTOTAL, CKMB, CKMBINDEX, TROPONINI in the last 168 hours. BNP (last 3 results) Recent Labs    05/06/21 2010  BNP 97.4    ProBNP (last 3 results) No results for input(s): PROBNP in the last 8760 hours.  CBG: No results for input(s): GLUCAP in the last 168 hours. Recent Results (from the past 240 hour(s))  Resp Panel by RT-PCR (Flu A&B, Covid) Nasopharyngeal Swab     Status: None   Collection Time: 05/06/21  8:10 PM   Specimen: Nasopharyngeal Swab; Nasopharyngeal(NP) swabs in vial transport medium  Result Value Ref Range Status   SARS Coronavirus 2 by RT PCR NEGATIVE NEGATIVE Final    Comment: (NOTE) SARS-CoV-2 target nucleic acids are NOT DETECTED.  The SARS-CoV-2 RNA is generally detectable in upper respiratory specimens during the acute phase of infection. The lowest concentration of SARS-CoV-2 viral copies this assay can detect is 138 copies/mL. A negative result does not preclude SARS-Cov-2 infection and should not be used as the sole basis for treatment or other patient management decisions. A negative result may occur with  improper specimen collection/handling, submission of specimen other than nasopharyngeal swab, presence of viral mutation(s) within the areas targeted by this assay, and inadequate  number of viral copies(<138 copies/mL). A negative result must be combined with clinical observations, patient history, and epidemiological information. The expected result is Negative.  Fact Sheet for Patients:  EntrepreneurPulse.com.au  Fact Sheet for Healthcare Providers:  IncredibleEmployment.be  This test is no t yet approved or cleared by the [place] FDA and  has been authorized for detection and/or diagnosis of SARS-CoV-2 by FDA under an Emergency Use Authorization (EUA). This EUA will remain  in effect (meaning this test can be used) for the duration of the COVID-19 declaration under Section 564(b)(1) of the Act, 21 U.S.C.section 360bbb-3(b)(1), unless the authorization is terminated  or revoked sooner.       Influenza A by PCR NEGATIVE NEGATIVE Final   Influenza B by PCR NEGATIVE NEGATIVE Final    Comment: (NOTE) The Xpert Xpress SARS-CoV-2/FLU/RSV plus assay is intended as an aid in the diagnosis of influenza from Nasopharyngeal swab specimens and should not be used as a sole basis for treatment. Nasal washings and aspirates are unacceptable for Xpert Xpress SARS-CoV-2/FLU/RSV testing.  Fact Sheet for Patients: EntrepreneurPulse.com.au  Fact Sheet for Healthcare Providers: IncredibleEmployment.be  This test is not yet approved or cleared by the [place] FDA and has been authorized for detection and/or diagnosis of SARS-CoV-2 by FDA under an Emergency Use Authorization (EUA). This EUA will remain in effect (meaning this test can be used) for the duration of the COVID-19 declaration under Section 564(b)(1) of the Act, 21 U.S.C. section 360bbb-3(b)(1), unless the authorization is terminated or revoked.  Performed at [facility], [place] [address] [place], [place] [address]   Blood Culture (routine x 2)     Status: None (Preliminary result)   Collection Time:  05/06/21  8:25 PM   Specimen: BLOOD  Result Value Ref Range Status   Specimen Description   Final    BLOOD PICC LINE FEMORAL ARTERY Performed at [facility] [address], [place],  [address]    Special Requests   Final  BOTTLES DRAWN AEROBIC AND ANAEROBIC Blood Culture adequate volume Performed at [facility] [address] [place], [place] [address]    Culture   Final    NO GROWTH 1 DAY Performed at [facility], [place] [address], [place], [place] [address]    Report Status PENDING  Incomplete  Blood Culture (routine x 2)     Status: None (Preliminary result)   Collection Time: 05/06/21  8:26 PM   Specimen: BLOOD LEFT ARM  Result Value Ref Range Status   Specimen Description   Final    BLOOD LEFT ARM Performed at [facility], [place] [address], [place], [place] [address]    Special Requests   Final    BOTTLES DRAWN AEROBIC AND ANAEROBIC Blood Culture results may not be optimal due to an inadequate volume of blood received in culture bottles Performed at [facility] [address], [place], [place] [address]    Culture   Final    NO GROWTH 1 DAY Performed at [facility], [place] [address], [place], [place] [address]    Report Status PENDING  Incomplete  Urine culture     Status: None   Collection Time: 05/06/21  8:26 PM   Specimen: In/Out Cath Urine  Result Value Ref Range Status   Specimen Description   Final    IN/OUT CATH URINE Performed at [facility], [place] [address] [place], [place] [address]    Special Requests   Final    NONE Performed at [facility], [place] [address], [place], [place] [address]    Culture   Final    NO GROWTH Performed at [facility], [place] [address] [place], [place] [address]    Report Status 05/08/2021 FINAL  Final     Studies: DG Chest Port 1 View  Result Date: 05/06/2021 CLINICAL DATA:  Questionable  sepsis EXAM: PORTABLE CHEST 1 VIEW COMPARISON:  February 09, 2021 FINDINGS: The heart size borderline. The hila and mediastinum are unremarkable. No pneumothorax. No nodules or masses. No focal infiltrates. IMPRESSION: No acute abnormalities.  No cause for sepsis identified. Electronically Signed   By: Dorise Bullion III M.D   On: 05/06/2021 21:18   VAS [place] LOWER EXTREMITY VENOUS (DVT)  Result Date: 05/08/2021  Lower Venous DVT Study Patient Name:  Caitlin Peters  Date of Exam:   05/07/2021 Medical Rec #: [id number]         Accession #:    [id number] Date of Birth: 08-Jul-1961         Patient Gender: F Patient Age:   60Y Exam Location:  [facility] Procedure:      VAS [place] LOWER EXTREMITY VENOUS (DVT) Referring Phys: [id number] ASIA B [place] --------------------------------------------------------------------------------  Indications: PT reports intermittent swelling RT foot. Has PICC RT thigh.  Limitations: Bandaging around PICC. Comparison Study: 11-08-2020 Prior RT lower extremity venous study was negative                   for DVT. Performing Technologist: Darlin Coco RDMS,RVT  Examination Guidelines: A complete evaluation includes B-mode imaging, spectral Doppler, color Doppler, and power Doppler as needed of all accessible portions of each vessel. Bilateral testing is considered an integral part of a complete examination. Limited examinations for reoccurring indications may be performed as noted. The reflux portion of the exam is performed with the patient in reverse Trendelenburg.  +---------+---------------+---------+-----------+----------+--------------+ RIGHT  CompressibilityPhasicitySpontaneityPropertiesThrombus Aging +---------+---------------+---------+-----------+----------+--------------+ CFV      Full           Yes      Yes                                 +---------+---------------+---------+-----------+----------+--------------+ SFJ      Full                                                         +---------+---------------+---------+-----------+----------+--------------+ FV Prox  Full                                                        +---------+---------------+---------+-----------+----------+--------------+ FV Mid   Full                                                        +---------+---------------+---------+-----------+----------+--------------+ FV DistalFull                                                        +---------+---------------+---------+-----------+----------+--------------+ PFV      Full                                                        +---------+---------------+---------+-----------+----------+--------------+ POP      Full           Yes      Yes                                 +---------+---------------+---------+-----------+----------+--------------+ PTV      Full                                                        +---------+---------------+---------+-----------+----------+--------------+ PERO     Full                                                        +---------+---------------+---------+-----------+----------+--------------+ Gastroc  Full                                                        +---------+---------------+---------+-----------+----------+--------------+   +----+---------------+---------+-----------+----------+--------------+  LEFTCompressibilityPhasicitySpontaneityPropertiesThrombus Aging +----+---------------+---------+-----------+----------+--------------+ CFV Full           Yes      Yes                                 +----+---------------+---------+-----------+----------+--------------+    Summary: RIGHT: - There is no evidence of deep vein thrombosis in the lower extremity.  - No cystic structure found in the popliteal fossa. - Limited visualization of PICC at proximal, lateral thigh secondary to bandaging.  LEFT: - No evidence of common femoral vein  obstruction.  *See table(s) above for measurements and observations. Electronically signed by Jamelle Haring on 05/08/2021 at 10:51:59 AM.    Final       Flora Lipps, MD  [facility] 05/08/2021  If 7PM-7AM, please contact night-coverage

## 2021-05-09 ENCOUNTER — Ambulatory Visit (HOSPITAL_COMMUNITY)
Admission: RE | Admit: 2021-05-09 | Discharge: 2021-05-09 | Disposition: A | Discharge status: Home / Self Care | Payer: Medicare Other | Source: Ambulatory Visit | Attending: Internal Medicine | Admitting: Internal Medicine

## 2021-05-09 DIAGNOSIS — N179 Acute kidney failure, unspecified: Secondary | ICD-10-CM | POA: Diagnosis not present

## 2021-05-09 DIAGNOSIS — R509 Fever, unspecified: Secondary | ICD-10-CM | POA: Diagnosis not present

## 2021-05-09 DIAGNOSIS — R7989 Other specified abnormal findings of blood chemistry: Secondary | ICD-10-CM | POA: Diagnosis not present

## 2021-05-09 DIAGNOSIS — K50819 Crohn's disease of both small and large intestine with unspecified complications: Secondary | ICD-10-CM | POA: Diagnosis not present

## 2021-05-09 DIAGNOSIS — K219 Gastro-esophageal reflux disease without esophagitis: Secondary | ICD-10-CM | POA: Diagnosis not present

## 2021-05-09 DIAGNOSIS — A419 Sepsis, unspecified organism: Secondary | ICD-10-CM | POA: Diagnosis not present

## 2021-05-09 LAB — COMPREHENSIVE METABOLIC PANEL
ALT: 781 U/L — ABNORMAL HIGH (ref 0–44)
AST: 251 U/L — ABNORMAL HIGH (ref 15–41)
Albumin: 2.2 g/dL — ABNORMAL LOW (ref 3.5–5.0)
Alkaline Phosphatase: 216 U/L — ABNORMAL HIGH (ref 38–126)
Anion gap: 6 (ref 5–15)
BUN: 14 mg/dL (ref 6–20)
CO2: 20 mmol/L — ABNORMAL LOW (ref 22–32)
Calcium: 7.5 mg/dL — ABNORMAL LOW (ref 8.9–10.3)
Chloride: 111 mmol/L (ref 98–111)
Creatinine, Ser: 1.21 mg/dL — ABNORMAL HIGH (ref 0.44–1.00)
GFR, Estimated: 51 mL/min — ABNORMAL LOW (ref >60)
Glucose, Bld: 86 mg/dL (ref 70–99)
Potassium: 3.1 mmol/L — ABNORMAL LOW (ref 3.5–5.1)
Sodium: 137 mmol/L (ref 135–145)
Total Bilirubin: 0.5 mg/dL (ref 0.3–1.2)
Total Protein: 5.6 g/dL — ABNORMAL LOW (ref 6.5–8.1)

## 2021-05-09 LAB — CBC
HCT: 28.2 % — ABNORMAL LOW (ref 36.0–46.0)
Hemoglobin: 9 g/dL — ABNORMAL LOW (ref 12.0–15.0)
MCH: 30.2 pg (ref 26.0–34.0)
MCHC: 31.9 g/dL (ref 30.0–36.0)
MCV: 94.6 fL (ref 80.0–100.0)
Platelets: 152 10*3/uL (ref 150–400)
RBC: 2.98 MIL/uL — ABNORMAL LOW (ref 3.87–5.11)
RDW: 14.7 % (ref 11.5–15.5)
WBC: 3.3 10*3/uL — ABNORMAL LOW (ref 4.0–10.5)
nRBC: 0 % (ref 0.0–0.2)

## 2021-05-09 LAB — PHOSPHORUS: Phosphorus: 1.8 mg/dL — ABNORMAL LOW (ref 2.5–4.6)

## 2021-05-09 LAB — C-REACTIVE PROTEIN: CRP: 2.3 mg/dL — ABNORMAL HIGH (ref <1.0)

## 2021-05-09 LAB — MAGNESIUM: Magnesium: 1.8 mg/dL (ref 1.7–2.4)

## 2021-05-09 LAB — SEDIMENTATION RATE: Sed Rate: 53 mm/hr — ABNORMAL HIGH (ref 0–22)

## 2021-05-09 MED ORDER — LACTATED RINGERS IV SOLN: INTRAVENOUS | Status: DC

## 2021-05-09 MED ORDER — POTASSIUM CHLORIDE 10 MEQ/100ML IV SOLN
10.0000 meq | INTRAVENOUS | Status: AC
  Administered 2021-05-09 (×4): 10 meq via INTRAVENOUS
  Filled 2021-05-09 (×5): qty 100

## 2021-05-09 MED ORDER — CYCLOSPORINE 0.05 % OP EMUL
1.0000 [drp] | Freq: Two times a day (BID) | OPHTHALMIC | Status: DC
  Administered 2021-05-09 – 2021-05-16 (×12): 1 [drp] via OPHTHALMIC
  Filled 2021-05-09 (×16): qty 1

## 2021-05-09 NOTE — Progress Notes (Signed)
[facility] for Infectious Disease    Date of Admission:  05/06/2021   Total days of antibiotics 3          ID: Caitlin Peters is a 60 y.o. 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   HTN (hypertension), benign   GERD (gastroesophageal reflux disease)   Acute kidney injury superimposed on chronic kidney disease ([name])   LFTs abnormal    Subjective: Feels poorly today, more nausea and abdominal discomfort; underwent MRI imaging which showed improvement in areas of thoracic discitis/ventral epidural phlegmon  Medications:   amLODipine  10 mg Oral Daily   budesonide  3 mg Oral QODAY   buPROPion  200 mg Oral Daily   Chlorhexidine Gluconate Cloth  6 each Topical Daily   cycloSPORINE  1 drop Both Eyes BID   DULoxetine  90 mg Oral Daily   enoxaparin (LOVENOX) injection  40 mg Subcutaneous Q24H   hydrALAZINE  75 mg Oral TID   lactobacillus   Oral Daily   levETIRAcetam  500 mg Oral BID   metoprolol tartrate  100 mg Oral BID   pantoprazole  40 mg Oral Daily   sodium chloride flush  10-40 mL Intracatheter Q12H   sucralfate  1 g Oral TID WC & HS    Objective: Vital signs in last 24 hours: Temp:  [98.6 F (37 C)-98.7 F (37.1 C)] 98.6 F (37 C) (06/14 1230) Pulse Rate:  [66-71] 66 (06/14 1230) Resp:  [18-19] 19 (06/14 1230) BP: (156-170)/(70-75) 162/70 (06/14 1230) SpO2:  [98 %-99 %] 99 % (06/14 1230) Physical Exam  Constitutional:  oriented to person, place, and time. appears well-developed and well-nourished. No distress.  HENT: [place]/AT, PERRLA, no scleral icterus.bilateral eyelid swelling Mouth/Throat: Oropharynx is clear and moist. No oropharyngeal exudate.  Cardiovascular: Normal rate, regular rhythm and normal heart sounds. Exam reveals no gallop and no friction rub.  No murmur heard.  Pulmonary/Chest: Effort normal and breath sounds normal. No respiratory distress.  has no wheezes.  Neck  = supple, no nuchal rigidity Abdominal: Soft. Bowel sounds are normal.  exhibits no distension. mild tenderness.  Ext: no pain Lymphadenopathy: no cervical adenopathy. No axillary adenopathy Neurological: alert and oriented to person, place, and time.  Skin: Skin is warm and dry. No rash noted. No erythema.  Psychiatric: a normal mood and affect.  behavior is normal.    Lab Results Recent Labs    05/08/21 1129 05/09/21 0359  WBC 3.5* 3.3*  HGB 9.4* 9.0*  HCT 29.1* 28.2*  NA 139 137  K 3.6 3.1*  CL 110 111  CO2 20* 20*  BUN 16 14  CREATININE 1.23* 1.21*   Liver Panel Recent Labs    05/08/21 1129 05/09/21 0359  PROT 6.4* 5.6*  ALBUMIN 2.5* 2.2*  AST 588* 251*  ALT 1,115* 781*  ALKPHOS 265* 216*  BILITOT 0.7 0.5   Sedimentation Rate Recent Labs    05/09/21 0359  ESRSEDRATE 53*   C-Reactive Protein Recent Labs    05/09/21 0359  CRP 2.3*    Microbiology: reviewed Studies/Results: MR THORACIC SPINE W WO CONTRAST  Result Date: 05/09/2021 CLINICAL DATA:  Fever and possible discitis EXAM: MRI THORACIC WITHOUT AND WITH CONTRAST TECHNIQUE: Multiplanar and multiecho pulse sequences of the thoracic spine were obtained without and with intravenous contrast. CONTRAST:  6.73m GADAVIST GADOBUTROL 1 MMOL/ML IV SOLN COMPARISON:  02/10/2021 FINDINGS: Alignment:  Physiologic. Vertebrae: Abnormal contrast enhancement and hyperintense T2-weighted signal at the T3-4 level has decreased since the prior study. Prevertebral and epidural abnormalities have also decreased. Cord:  Normal signal and morphology. Paraspinal and other soft tissues: Small, left-greater-than-right pleural effusions. Disc levels: No spinal canal or neural foraminal stenosis. IMPRESSION: 1. Decreased findings of discitis-osteomyelitis at the T3-4 level. 2. No spinal canal or neural foraminal stenosis. 3. Small, left-greater-than-right pleural effusions. Electronically Signed   By: Ulyses Jarred M.D.   On: 05/09/2021  00:44   MR Lumbar Spine W Wo Contrast  Result Date: 05/09/2021 CLINICAL DATA:  Fever with history of discitis EXAM: MRI LUMBAR SPINE WITHOUT AND WITH CONTRAST TECHNIQUE: Multiplanar and multiecho pulse sequences of the lumbar spine were obtained without and with intravenous contrast. CONTRAST:  6.42m GADAVIST GADOBUTROL 1 MMOL/ML IV SOLN COMPARISON:  None. FINDINGS: Segmentation:  Standard. Alignment:  Physiologic. Vertebrae:  No fracture, evidence of discitis, or bone lesion. Conus medullaris and cauda equina: Conus extends to the L1 level. Conus and cauda equina appear normal. Paraspinal and other soft tissues: Negative. Disc levels: No spinal canal or neural foraminal stenosis. IMPRESSION: Normal lumbar spine MRI. No discitis/osteomyelitis or epidural abscess at the lumbar levels. Electronically Signed   By: KUlyses JarredM.D.   On: 05/09/2021 00:54     Assessment/Plan: Nausea/abdominal discomfort = recommend abdominal U/S, check amylase/lipase in case pancreatitis  Transaminitis =unsure if it is due to hypoperfusion vs. TPN. Appears improving.  Fever of uknown cause = her blood cx are NGTD at 72hr. Recommend to stop vancomycin, cefepime and metronidazole  Thoracic discitis = improved, recommend to address follow up as outpatient. Does not appear to being the cause of her fevers  [facility] Infect[facility] Cell: [phone]: [phone]  05/09/2021, 5:44 PM

## 2021-05-09 NOTE — Progress Notes (Signed)
PROGRESS NOTE  SAKIA SCHRIMPF [facility]:[id number] DOB: 12/31/1960 DOA: 05/06/2021 PCP: [name], [name], MD   LOS: 3 days   Brief narrative:  Caitlin Peters  is a 60 y.o. female with history of acute pancreatitis, avascular necrosis of the bone, chronic pain syndrome, CKD stage III, Crohn's disease, GERD, hypertension, malnutrition, short gut syndrome, presented to the hospital with fever with abdominal pain, nausea.  Patient has chronic mucousy diarrhea at baseline.  Patient has been losing weight despite eating and being on TPN.  In the ED, patient was noted to be febrile with a temperature of 100.47F.  She did have leukopenia with white blood cell count of 2.1.  Patient was noted to have elevated creatinine and liver function test was also elevated.  Culture and urine cultures were sent from the ED and patient was admitted to the hospital for further evaluation and treatment.  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  Blood cultures negative in 2 days.  Urine culture no growth so far.  Patient does have a femoral line for TPN and was replaced in May.  Currently on broad-spectrum antibiotic including cefepime Flagyl and vancomycin.  Previous micro has grown staph epidermidis, Acinetobacter, and stenotrophomonas all maltophilia.  Infectious disease has seen the patient and recommended lumbar sacral spine MRI and thoracic spine MRI to rule out discitis as source of infection.  Patient had history of discitis in the past.  Lumbosacral spine was negative.  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  Acute kidney  injury. Continue with Ringer lactate infusion.  Creatinine of 1.2 today.  Transaminitis AST 567, ALT 297 this is up from 11 and 20 respectively earlier this year.  LFTs still elevated with AST at 251 and ALT of 781.  Check hepatitis panel.  Possibly secondary to infection, TPN.  TPN on hold at this time.  Crohn's/short gut syndrome. Continue Imodium and antimotility agents.  Continue IV fluid hydration  Right lower extremity edema Duplex ultrasound of the lower extremities showed no evidence of DVT.  Essential hypertension Continue amlodipine, hydralazine, metoprolol.  Continue to monitor blood pressure closely.  GERD Continue PPI  Depression Continue Cymbalta and bupropion  DVT prophylaxis: enoxaparin (LOVENOX) injection 40 mg Start: 05/07/21 1000 SCDs Start: 05/06/21 2347   Code Status: Full code  Family Communication: None  Status is: Inpatient  Remains inpatient appropriate because:IV treatments appropriate due to intensity of illness or inability to take PO and Inpatient level of care appropriate due to severity of illness  Dispo: The patient is from: Home              Anticipated d/c is to: Home              Patient currently is not medically stable to d/c.   Difficult to place patient No   Consultants: Infectious disease  Procedures: None  Anti-infectives:  Vancomycin, cefepime, metronidazole  Anti-infectives (From admission, onward)    Start     Dose/Rate Route Frequency Ordered Stop   05/08/21 0800  vancomycin (VANCOREADY) IVPB 1250 mg/250 mL  Status:  Discontinued        1,250 mg 166.7 mL/hr  over 90 Minutes Intravenous Every 36 hours 05/06/21 2321 05/07/21 1358   05/07/21 2200  vancomycin (VANCOREADY) IVPB 1250 mg/250 mL        1,250 mg 166.7 mL/hr over 90 Minutes Intravenous Every 24 hours 05/07/21 1358     05/07/21 1000  ceFEPIme (MAXIPIME) 2 g in sodium chloride 0.9 % 100 mL IVPB        2 g 200 mL/hr over 30 Minutes Intravenous Every 12 hours  05/06/21 2321     05/07/21 0500  metroNIDAZOLE (FLAGYL) IVPB 500 mg        500 mg 100 mL/hr over 60 Minutes Intravenous Every 8 hours 05/06/21 2346     05/06/21 2000  ceFEPIme (MAXIPIME) 2 g in sodium chloride 0.9 % 100 mL IVPB        2 g 200 mL/hr over 30 Minutes Intravenous  Once 05/06/21 1949 05/06/21 2216   05/06/21 2000  metroNIDAZOLE (FLAGYL) IVPB 500 mg        500 mg 100 mL/hr over 60 Minutes Intravenous  Once 05/06/21 1949 05/06/21 2124   05/06/21 2000  vancomycin (VANCOCIN) IVPB 1000 mg/200 mL premix  Status:  Discontinued        1,000 mg 200 mL/hr over 60 Minutes Intravenous  Once 05/06/21 1949 05/06/21 1953   05/06/21 2000  vancomycin (VANCOCIN) 1,500 mg in sodium chloride 0.9 % 500 mL IVPB        1,500 mg 250 mL/hr over 120 Minutes Intravenous  Once 05/06/21 1953 05/06/21 2249       Subjective: Today, patient was seen and examined at bedside.  He still feels uncomfortable with chronic pain and discomfort.  Objective: Vitals:   05/08/21 2040 05/09/21 0522  BP: (!) 156/75 (!) 170/72  Pulse: 70 71  Resp: 18 18  Temp: 98.7 F (37.1 C) 98.6 F (37 C)  SpO2: 99% 98%    Intake/Output Summary (Last 24 hours) at 05/09/2021 1105 Last data filed at 05/09/2021 0710 Gross per 24 hour  Intake 250 ml  Output 1500 ml  Net -1250 ml    Filed Weights   05/06/21 1926  Weight: 67.2 kg   Body mass index is 22.53 kg/m.   Physical Exam:  GENERAL: Patient is alert awake and oriented. Not in obvious distress. HENT: No scleral pallor or icterus. Pupils equally reactive to light. Oral mucosa is moist NECK: is supple, no gross swelling noted. CHEST: Clear to auscultation. No crackles or wheezes.  Diminished breath sounds bilaterally. CVS: S1 and S2 heard, no murmur. Regular rate and rhythm.  ABDOMEN: Soft, non-tender, bowel sounds are present.  Bowel sounds present.  Midline scar noted. EXTREMITIES: No edema.  Right femoral catheter in place without any induration.  CNS:  Cranial nerves are intact. No focal motor deficits. SKIN: warm and dry without rashes.  Data Review: I have personally reviewed the following laboratory data and studies,  CBC: Recent Labs  Lab 05/06/21 2010 05/07/21 1210 05/08/21 1129 05/09/21 0359  WBC 2.1* 5.3 3.5* 3.3*  NEUTROABS 1.9 4.2  --   --   HGB 9.3* 8.7* 9.4* 9.0*  HCT 29.2* 27.3* 29.1* 28.2*  MCV 96.1 94.1 94.2 94.6  PLT 179 129* 147* 462    Basic Metabolic Panel: Recent Labs  Lab 05/06/21 2010 05/07/21 1210 05/08/21 0500 05/08/21 1129 05/09/21 0359  NA 139 138  --  139 137  K 3.7 3.8  --  3.6 3.1*  CL 108 110  --  110 111  CO2 22  21*  --  20* 20*  GLUCOSE 84 82  --  104* 86  BUN 37* 24*  --  16 14  CREATININE 1.55* 1.17* 1.32* 1.23* 1.21*  CALCIUM 8.4* 7.7*  --  8.3* 7.5*  MG  --  1.7  --  1.7 1.8  PHOS  --  2.4*  --  2.1* 1.8*    Liver Function Tests: Recent Labs  Lab 05/06/21 2010 05/07/21 1210 05/08/21 1129 05/09/21 0359  AST 567*  --  588* 251*  ALT 297*  --  1,115* 781*  ALKPHOS 148*  --  265* 216*  BILITOT 0.5  --  0.7 0.5  PROT 6.3*  --  6.4* 5.6*  ALBUMIN 2.6* 2.4* 2.5* 2.2*    No results for input(s): LIPASE, AMYLASE in the last 168 hours. No results for input(s): AMMONIA in the last 168 hours. Cardiac Enzymes: No results for input(s): CKTOTAL, CKMB, CKMBINDEX, TROPONINI in the last 168 hours. BNP (last 3 results) Recent Labs    05/06/21 2010  BNP 97.4     ProBNP (last 3 results) No results for input(s): PROBNP in the last 8760 hours.  CBG: No results for input(s): GLUCAP in the last 168 hours. Recent Results (from the past 240 hour(s))  Resp Panel by RT-PCR (Flu A&B, Covid) Nasopharyngeal Swab     Status: None   Collection Time: 05/06/21  8:10 PM   Specimen: Nasopharyngeal Swab; Nasopharyngeal(NP) swabs in vial transport medium  Result Value Ref Range Status   SARS Coronavirus 2 by RT PCR NEGATIVE NEGATIVE Final    Comment: (NOTE) SARS-CoV-2 target nucleic acids  are NOT DETECTED.  The SARS-CoV-2 RNA is generally detectable in upper respiratory specimens during the acute phase of infection. The lowest concentration of SARS-CoV-2 viral copies this assay can detect is 138 copies/mL. A negative result does not preclude SARS-Cov-2 infection and should not be used as the sole basis for treatment or other patient management decisions. A negative result may occur with  improper specimen collection/handling, submission of specimen other than nasopharyngeal swab, presence of viral mutation(s) within the areas targeted by this assay, and inadequate number of viral copies(<138 copies/mL). A negative result must be combined with clinical observations, patient history, and epidemiological information. The expected result is Negative.  Fact Sheet for Patients:  EntrepreneurPulse.com.au  Fact Sheet for Healthcare Providers:  IncredibleEmployment.be  This test is no t yet approved or cleared by the [place] FDA and  has been authorized for detection and/or diagnosis of SARS-CoV-2 by FDA under an Emergency Use Authorization (EUA). This EUA will remain  in effect (meaning this test can be used) for the duration of the COVID-19 declaration under Section 564(b)(1) of the Act, 21 U.S.C.section 360bbb-3(b)(1), unless the authorization is terminated  or revoked sooner.       Influenza A by PCR NEGATIVE NEGATIVE Final   Influenza B by PCR NEGATIVE NEGATIVE Final    Comment: (NOTE) The Xpert Xpress SARS-CoV-2/FLU/RSV plus assay is intended as an aid in the diagnosis of influenza from Nasopharyngeal swab specimens and should not be used as a sole basis for treatment. Nasal washings and aspirates are unacceptable for Xpert Xpress SARS-CoV-2/FLU/RSV testing.  Fact Sheet for Patients: EntrepreneurPulse.com.au  Fact Sheet for Healthcare Providers: IncredibleEmployment.be  This test is  not yet approved or cleared by the [place] FDA and has been authorized for detection and/or diagnosis of SARS-CoV-2 by FDA under an Emergency Use Authorization (EUA). This EUA will remain in effect (meaning this  test can be used) for the duration of the COVID-19 declaration under Section 564(b)(1) of the Act, 21 U.S.C. section 360bbb-3(b)(1), unless the authorization is terminated or revoked.  Performed at [facility], [place] [address] [place], [place] [address]   Blood Culture (routine x 2)     Status: None (Preliminary result)   Collection Time: 05/06/21  8:25 PM   Specimen: BLOOD  Result Value Ref Range Status   Specimen Description   Final    BLOOD PICC LINE FEMORAL ARTERY Performed at [place] [address], [place], [place] [address]    Special Requests   Final    BOTTLES DRAWN AEROBIC AND ANAEROBIC Blood Culture adequate volume Performed at [facility] [place] [address] [place], [place] [address]    Culture   Final    NO GROWTH 2 DAYS Performed at [facility] [address], [place], [place] [address]    Report Status PENDING  Incomplete  Blood Culture (routine x 2)     Status: None (Preliminary result)   Collection Time: 05/06/21  8:26 PM   Specimen: BLOOD LEFT ARM  Result Value Ref Range Status   Specimen Description   Final    BLOOD LEFT ARM Performed at [facility], [place] [address], [place], [place] [address]    Special Requests   Final    BOTTLES DRAWN AEROBIC AND ANAEROBIC Blood Culture results may not be optimal due to an inadequate volume of blood received in culture bottles Performed at [place] [address], [place], [place] [address]    Culture   Final    NO GROWTH 2 DAYS Performed at [facility] [address] [place], [place] [address]    Report Status PENDING  Incomplete  Urine culture     Status: None   Collection Time: 05/06/21   8:26 PM   Specimen: In/Out Cath Urine  Result Value Ref Range Status   Specimen Description   Final    IN/OUT CATH URINE Performed at [facility], [place] [address], [place], [place] [address]    Special Requests   Final    NONE Performed at [facility], [place] [address], [place], [place] [address]    Culture   Final    NO GROWTH Performed at [facility], [place] [address], [place], [place] [address]    Report Status 05/08/2021 FINAL  Final      Studies: MR THORACIC SPINE W WO CONTRAST  Result Date: 05/09/2021 CLINICAL DATA:  Fever and possible discitis EXAM: MRI THORACIC WITHOUT AND WITH CONTRAST TECHNIQUE: Multiplanar and multiecho pulse sequences of the thoracic spine were obtained without and with intravenous contrast. CONTRAST:  6.43m GADAVIST GADOBUTROL 1 MMOL/ML IV SOLN COMPARISON:  02/10/2021 FINDINGS: Alignment:  Physiologic. Vertebrae: Abnormal contrast enhancement and hyperintense T2-weighted signal at the T3-4 level has decreased since the prior study. Prevertebral and epidural abnormalities have also decreased. Cord:  Normal signal and morphology. Paraspinal and other soft tissues: Small, left-greater-than-right pleural effusions. Disc levels: No spinal canal or neural foraminal stenosis. IMPRESSION: 1. Decreased findings of discitis-osteomyelitis at the T3-4 level. 2. No spinal canal or neural foraminal stenosis. 3. Small, left-greater-than-right pleural effusions. Electronically Signed   By: KUlyses JarredM.D.   On: 05/09/2021 00:44   MR Lumbar Spine W Wo Contrast  Result Date: 05/09/2021 CLINICAL DATA:  Fever with history of discitis EXAM: MRI LUMBAR  SPINE WITHOUT AND WITH CONTRAST TECHNIQUE: Multiplanar and multiecho pulse sequences of the lumbar spine were obtained without and with intravenous contrast. CONTRAST:  6.48m GADAVIST GADOBUTROL 1 MMOL/ML IV SOLN COMPARISON:  None. FINDINGS: Segmentation:  Standard. Alignment:  Physiologic.  Vertebrae:  No fracture, evidence of discitis, or bone lesion. Conus medullaris and cauda equina: Conus extends to the L1 level. Conus and cauda equina appear normal. Paraspinal and other soft tissues: Negative. Disc levels: No spinal canal or neural foraminal stenosis. IMPRESSION: Normal lumbar spine MRI. No discitis/osteomyelitis or epidural abscess at the lumbar levels. Electronically Signed   By: KUlyses JarredM.D.   On: 05/09/2021 00:54      LFlora Lipps MD  [facility] 05/09/2021  If 7PM-7AM, please contact night-coverage

## 2021-05-10 ENCOUNTER — Inpatient Hospital Stay (HOSPITAL_COMMUNITY): Payer: Medicare Other

## 2021-05-10 DIAGNOSIS — K50819 Crohn's disease of both small and large intestine with unspecified complications: Secondary | ICD-10-CM | POA: Diagnosis not present

## 2021-05-10 DIAGNOSIS — K219 Gastro-esophageal reflux disease without esophagitis: Secondary | ICD-10-CM | POA: Diagnosis not present

## 2021-05-10 DIAGNOSIS — A419 Sepsis, unspecified organism: Secondary | ICD-10-CM | POA: Diagnosis not present

## 2021-05-10 DIAGNOSIS — R509 Fever, unspecified: Secondary | ICD-10-CM | POA: Diagnosis not present

## 2021-05-10 DIAGNOSIS — R7989 Other specified abnormal findings of blood chemistry: Secondary | ICD-10-CM | POA: Diagnosis not present

## 2021-05-10 DIAGNOSIS — N179 Acute kidney failure, unspecified: Secondary | ICD-10-CM | POA: Diagnosis not present

## 2021-05-10 LAB — CBC
HCT: 28.1 % — ABNORMAL LOW (ref 36.0–46.0)
Hemoglobin: 9.3 g/dL — ABNORMAL LOW (ref 12.0–15.0)
MCH: 30.9 pg (ref 26.0–34.0)
MCHC: 33.1 g/dL (ref 30.0–36.0)
MCV: 93.4 fL (ref 80.0–100.0)
Platelets: 160 10*3/uL (ref 150–400)
RBC: 3.01 MIL/uL — ABNORMAL LOW (ref 3.87–5.11)
RDW: 14.6 % (ref 11.5–15.5)
WBC: 3.4 10*3/uL — ABNORMAL LOW (ref 4.0–10.5)
nRBC: 0 % (ref 0.0–0.2)

## 2021-05-10 LAB — BASIC METABOLIC PANEL
Anion gap: 6 (ref 5–15)
BUN: 13 mg/dL (ref 6–20)
CO2: 18 mmol/L — ABNORMAL LOW (ref 22–32)
Calcium: 7.3 mg/dL — ABNORMAL LOW (ref 8.9–10.3)
Chloride: 112 mmol/L — ABNORMAL HIGH (ref 98–111)
Creatinine, Ser: 1.08 mg/dL — ABNORMAL HIGH (ref 0.44–1.00)
GFR, Estimated: 59 mL/min — ABNORMAL LOW (ref >60)
Glucose, Bld: 78 mg/dL (ref 70–99)
Potassium: 3.2 mmol/L — ABNORMAL LOW (ref 3.5–5.1)
Sodium: 136 mmol/L (ref 135–145)

## 2021-05-10 LAB — HEPATIC FUNCTION PANEL
ALT: 398 U/L — ABNORMAL HIGH (ref 0–44)
AST: 62 U/L — ABNORMAL HIGH (ref 15–41)
Albumin: 2.2 g/dL — ABNORMAL LOW (ref 3.5–5.0)
Alkaline Phosphatase: 211 U/L — ABNORMAL HIGH (ref 38–126)
Bilirubin, Direct: 0.2 mg/dL (ref 0.0–0.2)
Indirect Bilirubin: 0.5 mg/dL (ref 0.3–0.9)
Total Bilirubin: 0.7 mg/dL (ref 0.3–1.2)
Total Protein: 5.6 g/dL — ABNORMAL LOW (ref 6.5–8.1)

## 2021-05-10 LAB — HEPATITIS PANEL, ACUTE
HCV Ab: NONREACTIVE
Hep A IgM: NONREACTIVE
Hep B C IgM: NONREACTIVE
Hepatitis B Surface Ag: NONREACTIVE

## 2021-05-10 LAB — MAGNESIUM: Magnesium: 1.5 mg/dL — ABNORMAL LOW (ref 1.7–2.4)

## 2021-05-10 LAB — PHOSPHORUS: Phosphorus: 1.4 mg/dL — ABNORMAL LOW (ref 2.5–4.6)

## 2021-05-10 MED ORDER — DEXTROSE IN LACTATED RINGERS 5 % IV SOLN: INTRAVENOUS | Status: AC

## 2021-05-10 MED ORDER — HYDROMORPHONE HCL 1 MG/ML IJ SOLN
1.0000 mg | INTRAMUSCULAR | Status: DC | PRN
  Administered 2021-05-10 – 2021-05-16 (×58): 1 mg via INTRAVENOUS
  Filled 2021-05-10 (×59): qty 1

## 2021-05-10 MED ORDER — MAGNESIUM SULFATE 2 GM/50ML IV SOLN
2.0000 g | Freq: Once | INTRAVENOUS | Status: AC
  Administered 2021-05-10: 2 g via INTRAVENOUS
  Filled 2021-05-10: qty 50

## 2021-05-10 MED ORDER — POTASSIUM PHOSPHATES 15 MMOLE/5ML IV SOLN
30.0000 mmol | Freq: Once | INTRAVENOUS | Status: AC
  Administered 2021-05-10: 30 mmol via INTRAVENOUS
  Filled 2021-05-10: qty 10

## 2021-05-10 NOTE — Progress Notes (Signed)
[facility] for Infectious Disease    Date of Admission:  05/06/2021   Total days of antibiotics 5           ID: Caitlin Peters is a 60 y.o. 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   HTN (hypertension), benign   GERD (gastroesophageal reflux disease)   Acute kidney injury superimposed on chronic kidney disease ([name])   LFTs abnormal    Subjective: Still having marked abdominal discomfort and ongoing nausea RUQ U/S unrevealing afebrile  Medications:   amLODipine  10 mg Oral Daily   budesonide  3 mg Oral QODAY   buPROPion  200 mg Oral Daily   Chlorhexidine Gluconate Cloth  6 each Topical Daily   cycloSPORINE  1 drop Both Eyes BID   DULoxetine  90 mg Oral Daily   enoxaparin (LOVENOX) injection  40 mg Subcutaneous Q24H   hydrALAZINE  75 mg Oral TID   lactobacillus   Oral Daily   levETIRAcetam  500 mg Oral BID   metoprolol tartrate  100 mg Oral BID   pantoprazole  40 mg Oral Daily   sodium chloride flush  10-40 mL Intracatheter Q12H   sucralfate  1 g Oral TID WC & HS    Objective: Vital signs in last 24 hours: Temp:  [98.4 F (36.9 C)-98.7 F (37.1 C)] 98.6 F (37 C) (06/15 1431) Pulse Rate:  [70-75] 75 (06/15 1431) Resp:  [11-18] 18 (06/15 1431) BP: (154-166)/(69-83) 161/74 (06/15 1431) SpO2:  [99 %-100 %] 99 % (06/15 1431) Physical Exam  Constitutional:  oriented to person, place, and time. appears well-developed and well-nourished. No distress.  HENT: [place]/AT, PERRLA, no scleral icterus Mouth/Throat: Oropharynx is clear and moist. No oropharyngeal exudate.  Cardiovascular: Normal rate, regular rhythm and normal heart sounds. Exam reveals no gallop and no friction rub.  No murmur heard.  Pulmonary/Chest: Effort normal and breath sounds normal. No respiratory distress.  has no wheezes.  Neck = supple, no nuchal rigidity Abdominal: Soft. Bowel sounds are normal. Guarding to  mid/left upper quadrant Lymphadenopathy: no cervical adenopathy. No axillary adenopathy Neurological: alert and oriented to person, place, and time.  Skin: Skin is warm and dry. No rash noted. No erythema.  Psychiatric: tearful   Lab Results Recent Labs    05/09/21 0359 05/10/21 0357  WBC 3.3* 3.4*  HGB 9.0* 9.3*  HCT 28.2* 28.1*  NA 137 136  K 3.1* 3.2*  CL 111 112*  CO2 20* 18*  BUN 14 13  CREATININE 1.21* 1.08*   Liver Panel Recent Labs    05/08/21 1129 05/09/21 0359  PROT 6.4* 5.6*  ALBUMIN 2.5* 2.2*  AST 588* 251*  ALT 1,115* 781*  ALKPHOS 265* 216*  BILITOT 0.7 0.5   Sedimentation Rate Recent Labs    05/09/21 0359  ESRSEDRATE 53*   C-Reactive Protein Recent Labs    05/09/21 0359  CRP 2.3*    Microbiology: reviewed Studies/Results: MR THORACIC SPINE W WO CONTRAST  Result Date: 05/09/2021 CLINICAL DATA:  Fever and possible discitis EXAM: MRI THORACIC WITHOUT AND WITH CONTRAST TECHNIQUE: Multiplanar and multiecho pulse sequences of the thoracic spine were obtained without and with intravenous contrast. CONTRAST:  6.50m GADAVIST GADOBUTROL 1 MMOL/ML IV SOLN COMPARISON:  02/10/2021 FINDINGS: Alignment:  Physiologic. Vertebrae: Abnormal contrast enhancement and hyperintense T2-weighted signal at the T3-4 level has decreased since the prior study. Prevertebral and epidural abnormalities  have also decreased. Cord:  Normal signal and morphology. Paraspinal and other soft tissues: Small, left-greater-than-right pleural effusions. Disc levels: No spinal canal or neural foraminal stenosis. IMPRESSION: 1. Decreased findings of discitis-osteomyelitis at the T3-4 level. 2. No spinal canal or neural foraminal stenosis. 3. Small, left-greater-than-right pleural effusions. Electronically Signed   By: Ulyses Jarred M.D.   On: 05/09/2021 00:44   MR Lumbar Spine W Wo Contrast  Result Date: 05/09/2021 CLINICAL DATA:  Fever with history of discitis EXAM: MRI LUMBAR SPINE  WITHOUT AND WITH CONTRAST TECHNIQUE: Multiplanar and multiecho pulse sequences of the lumbar spine were obtained without and with intravenous contrast. CONTRAST:  6.74m GADAVIST GADOBUTROL 1 MMOL/ML IV SOLN COMPARISON:  None. FINDINGS: Segmentation:  Standard. Alignment:  Physiologic. Vertebrae:  No fracture, evidence of discitis, or bone lesion. Conus medullaris and cauda equina: Conus extends to the L1 level. Conus and cauda equina appear normal. Paraspinal and other soft tissues: Negative. Disc levels: No spinal canal or neural foraminal stenosis. IMPRESSION: Normal lumbar spine MRI. No discitis/osteomyelitis or epidural abscess at the lumbar levels. Electronically Signed   By: KUlyses JarredM.D.   On: 05/09/2021 00:54   [place]DOMEN LIMITED RUQ (LIVER/GB)  Result Date: 05/10/2021 CLINICAL DATA:  Elevated LFTs EXAM: ULTRASOUND ABDOMEN LIMITED RIGHT UPPER QUADRANT COMPARISON:  04/07/2021 FINDINGS: Gallbladder: Surgically removed Common bile duct: Diameter: 6.2 mm. Liver: No focal lesion identified. Within normal limits in parenchymal echogenicity. Portal vein is patent on color Doppler imaging with normal direction of blood flow towards the liver. Other: Multiple fluid-filled loops of bowel are noted similar to that seen on recent CT examination. IMPRESSION: Status post cholecystectomy. No acute abnormality noted. Electronically Signed   By: MInez CatalinaM.D.   On: 05/10/2021 13:14     Assessment/Plan: Initially came in with isolated fever = blood cx negative, mri of spine showing resolving discitis. She was empirically started on iv abtx. Recommendations to stop and observe off of abtx  Now mostly abdominal pain and nausea= initial transaminitis now improved.(Presumed hypoperfusion). Trending downward. Will add hepatic panel to today's lab Recommend to increase pain meds and have GI evaluate if need to do EGD vs other studies.  Aki = now improved  CJack [facility]. [facility] Centerfor Infect[facility]  Diseases Cell: [phone]: [phone]  05/10/2021, 5:20 PM

## 2021-05-10 NOTE — Progress Notes (Addendum)
PROGRESS NOTE  Caitlin Peters JQB:[id number] DOB: July 15, 1961 DOA: 05/06/2021 PCP: [name], [name], MD   LOS: 4 days   Brief narrative:  Caitlin Peters  is a 60 y.o. female with history of acute pancreatitis, avascular necrosis of the bone, chronic pain syndrome, CKD stage III, Crohn's disease, GERD, hypertension, malnutrition, short gut syndrome, presented to the hospital with fever with abdominal pain, nausea.  Patient has chronic mucousy diarrhea at baseline.  Patient has been losing weight despite eating and being on TPN.  In the ED, patient was noted to be febrile with a temperature of 100.34F.  She did have leukopenia with white blood cell count of 2.1.  Patient was noted to have elevated creatinine and liver function test was also elevated.  Culture and urine cultures were sent from the ED and patient was admitted to the hospital for further evaluation and treatment.  Assessment/Plan:  Principal Problem:   Sepsis ([place]) Active Problems:   Crohn's disease of small & large intestine with SGS   HTN (hypertension), benign   GERD (gastroesophageal reflux disease)   Acute kidney injury superimposed on chronic kidney disease ([name])   LFTs abnormal  Sepsis  Patient presented with leukopenia, fever, elevated LFTs, acute kidney injury.  Blood cultures negative so far. Urine culture no growth so far.  Patient does have a femoral line for TPN and was replaced in May.  Infectious disease was consulted.  MRI of the thoracic and lumbar spine showed resolving discitis.  No clear source of infection at this time.  Antibiotics has been discontinued as per ID recommendation.    Acute kidney injury. Continue with Ringer lactate infusion.  Creatinine of 1.2 today.  Transaminitis We will continue to trend LFTs.  TPN was on hold as outpatient as well due to elevated LFTs.  We will check LFTs in a.m. and consider restarting TPN soon.  Hepatitis panel was negative.  Check ultrasound of the right  upper quadrant.  Crohn's/short gut syndrome. Continue Imodium and antimotility agents.  Continue IV fluid hydration, will add dextrose with it.  Right lower extremity edema Duplex ultrasound of the lower extremities showed no evidence of DVT.  Essential hypertension Continue amlodipine, hydralazine, metoprolol.  Continue to monitor blood pressure closely.  GERD Continue PPI  Depression Continue Cymbalta and bupropion  Addendum:  05/10/2021 5:38 PM   Ultrasound of the liver did not show any acute findings except status postcholecystectomy.  Communicated with Dr. Graylon Good.  Patient complains of increasing abdominal pain.  Increased her Dilaudid dose.  With history of TPN poor oral intake Crohn's disease with short gut syndrome.  We will get GI opinion.  Communicated to [facility] via epic chat for consult.  DVT prophylaxis: enoxaparin (LOVENOX) injection 40 mg Start: 05/07/21 1000 SCDs Start: 05/06/21 2347   Code Status: Full code  Family Communication: None  Status is: Inpatient  Remains inpatient appropriate because:IV treatments appropriate due to intensity of illness or inability to take PO and Inpatient level of care appropriate due to severity of illness  Dispo: The patient is from: Home              Anticipated d/c is to: Home              Patient currently is not medically stable to d/c.   Difficult to place patient No   Consultants: Infectious disease  Procedures: None  Anti-infectives:  Has been discontinued.  Anti-infectives (From admission, onward)    Start  Dose/Rate Route Frequency Ordered Stop   05/08/21 0800  vancomycin (VANCOREADY) IVPB 1250 mg/250 mL  Status:  Discontinued        1,250 mg 166.7 mL/hr over 90 Minutes Intravenous Every 36 hours 05/06/21 2321 05/07/21 1358   05/07/21 2200  vancomycin (VANCOREADY) IVPB 1250 mg/250 mL  Status:  Discontinued        1,250 mg 166.7 mL/hr over 90 Minutes Intravenous Every 24 hours 05/07/21 1358 05/09/21  1744   05/07/21 1000  ceFEPIme (MAXIPIME) 2 g in sodium chloride 0.9 % 100 mL IVPB        2 g 200 mL/hr over 30 Minutes Intravenous Every 12 hours 05/06/21 2321     05/07/21 0500  metroNIDAZOLE (FLAGYL) IVPB 500 mg        500 mg 100 mL/hr over 60 Minutes Intravenous Every 8 hours 05/06/21 2346     05/06/21 2000  ceFEPIme (MAXIPIME) 2 g in sodium chloride 0.9 % 100 mL IVPB        2 g 200 mL/hr over 30 Minutes Intravenous  Once 05/06/21 1949 05/06/21 2216   05/06/21 2000  metroNIDAZOLE (FLAGYL) IVPB 500 mg        500 mg 100 mL/hr over 60 Minutes Intravenous  Once 05/06/21 1949 05/06/21 2124   05/06/21 2000  vancomycin (VANCOCIN) IVPB 1000 mg/200 mL premix  Status:  Discontinued        1,000 mg 200 mL/hr over 60 Minutes Intravenous  Once 05/06/21 1949 05/06/21 1953   05/06/21 2000  vancomycin (VANCOCIN) 1,500 mg in sodium chloride 0.9 % 500 mL IVPB        1,500 mg 250 mL/hr over 120 Minutes Intravenous  Once 05/06/21 1953 05/06/21 2249       Subjective: Today, patient was seen and examined at bedside.  Patient still complains of nausea and abdominal discomfort.  Objective: Vitals:   05/10/21 0433 05/10/21 0842  BP: (!) 166/76 (!) 166/83  Pulse: 70 72  Resp: 18 11  Temp: 98.5 F (36.9 C) 98.7 F (37.1 C)  SpO2: 99% 100%    Intake/Output Summary (Last 24 hours) at 05/10/2021 1138 Last data filed at 05/10/2021 0700 Gross per 24 hour  Intake 2076.08 ml  Output --  Net 2076.08 ml    Filed Weights   05/06/21 1926  Weight: 67.2 kg   Body mass index is 22.53 kg/m.   Physical Exam:  General:  Average built, not in obvious distress, alert awake and oriented. HENT:   No scleral pallor or icterus noted. Oral mucosa is moist.  Chest:  Clear breath sounds.  Diminished breath sounds bilaterally. No crackles or wheezes.  CVS: S1 &S2 heard. No murmur.  Regular rate and rhythm. Abdomen: Soft, nontender, nondistended.  Bowel sounds are heard.   Extremities: No cyanosis, clubbing  or edema.  Peripheral pulses are palpable.  Right femoral catheter in place. Psych: Alert, awake and oriented, normal mood CNS:  No cranial nerve deficits.  Power equal in all extremities.   Skin: Warm and dry.  No rashes noted.   Data Review: I have personally reviewed the following laboratory data and studies,  CBC: Recent Labs  Lab 05/06/21 2010 05/07/21 1210 05/08/21 1129 05/09/21 0359 05/10/21 0357  WBC 2.1* 5.3 3.5* 3.3* 3.4*  NEUTROABS 1.9 4.2  --   --   --   HGB 9.3* 8.7* 9.4* 9.0* 9.3*  HCT 29.2* 27.3* 29.1* 28.2* 28.1*  MCV 96.1 94.1 94.2 94.6 93.4  PLT 179 129* 147*  152 937    Basic Metabolic Panel: Recent Labs  Lab 05/06/21 2010 05/07/21 1210 05/08/21 0500 05/08/21 1129 05/09/21 0359 05/10/21 0357  NA 139 138  --  139 137 136  K 3.7 3.8  --  3.6 3.1* 3.2*  CL 108 110  --  110 111 112*  CO2 22 21*  --  20* 20* 18*  GLUCOSE 84 82  --  104* 86 78  BUN 37* 24*  --  16 14 13   CREATININE 1.55* 1.17* 1.32* 1.23* 1.21* 1.08*  CALCIUM 8.4* 7.7*  --  8.3* 7.5* 7.3*  MG  --  1.7  --  1.7 1.8 1.5*  PHOS  --  2.4*  --  2.1* 1.8* 1.4*    Liver Function Tests: Recent Labs  Lab 05/06/21 2010 05/07/21 1210 05/08/21 1129 05/09/21 0359  AST 567*  --  588* 251*  ALT 297*  --  1,115* 781*  ALKPHOS 148*  --  265* 216*  BILITOT 0.5  --  0.7 0.5  PROT 6.3*  --  6.4* 5.6*  ALBUMIN 2.6* 2.4* 2.5* 2.2*    No results for input(s): LIPASE, AMYLASE in the last 168 hours. No results for input(s): AMMONIA in the last 168 hours. Cardiac Enzymes: No results for input(s): CKTOTAL, CKMB, CKMBINDEX, TROPONINI in the last 168 hours. BNP (last 3 results) Recent Labs    05/06/21 2010  BNP 97.4     ProBNP (last 3 results) No results for input(s): PROBNP in the last 8760 hours.  CBG: No results for input(s): GLUCAP in the last 168 hours. Recent Results (from the past 240 hour(s))  Resp Panel by RT-PCR (Flu A&B, Covid) Nasopharyngeal Swab     Status: None   Collection  Time: 05/06/21  8:10 PM   Specimen: Nasopharyngeal Swab; Nasopharyngeal(NP) swabs in vial transport medium  Result Value Ref Range Status   SARS Coronavirus 2 by RT PCR NEGATIVE NEGATIVE Final    Comment: (NOTE) SARS-CoV-2 target nucleic acids are NOT DETECTED.  The SARS-CoV-2 RNA is generally detectable in upper respiratory specimens during the acute phase of infection. The lowest concentration of SARS-CoV-2 viral copies this assay can detect is 138 copies/mL. A negative result does not preclude SARS-Cov-2 infection and should not be used as the sole basis for treatment or other patient management decisions. A negative result may occur with  improper specimen collection/handling, submission of specimen other than nasopharyngeal swab, presence of viral mutation(s) within the areas targeted by this assay, and inadequate number of viral copies(<138 copies/mL). A negative result must be combined with clinical observations, patient history, and epidemiological information. The expected result is Negative.  Fact Sheet for Patients:  EntrepreneurPulse.com.au  Fact Sheet for Healthcare Providers:  IncredibleEmployment.be  This test is no t yet approved or cleared by the [place] FDA and  has been authorized for detection and/or diagnosis of SARS-CoV-2 by FDA under an Emergency Use Authorization (EUA). This EUA will remain  in effect (meaning this test can be used) for the duration of the COVID-19 declaration under Section 564(b)(1) of the Act, 21 U.S.C.section 360bbb-3(b)(1), unless the authorization is terminated  or revoked sooner.       Influenza A by PCR NEGATIVE NEGATIVE Final   Influenza B by PCR NEGATIVE NEGATIVE Final    Comment: (NOTE) The Xpert Xpress SARS-CoV-2/FLU/RSV plus assay is intended as an aid in the diagnosis of influenza from Nasopharyngeal swab specimens and should not be used as a sole basis for treatment. Nasal  washings  and aspirates are unacceptable for Xpert Xpress SARS-CoV-2/FLU/RSV testing.  Fact Sheet for Patients: EntrepreneurPulse.com.au  Fact Sheet for Healthcare Providers: IncredibleEmployment.be  This test is not yet approved or cleared by the [place] FDA and has been authorized for detection and/or diagnosis of SARS-CoV-2 by FDA under an Emergency Use Authorization (EUA). This EUA will remain in effect (meaning this test can be used) for the duration of the COVID-19 declaration under Section 564(b)(1) of the Act, 21 U.S.C. section 360bbb-3(b)(1), unless the authorization is terminated or revoked.  Performed at [facility], [place] [address], [place], [place] [address]   Blood Culture (routine x 2)     Status: None (Preliminary result)   Collection Time: 05/06/21  8:25 PM   Specimen: BLOOD  Result Value Ref Range Status   Specimen Description   Final    BLOOD PICC LINE FEMORAL ARTERY Performed at [facility] [address], [place], [place] [address]    Special Requests   Final    BOTTLES DRAWN AEROBIC AND ANAEROBIC Blood Culture adequate volume Performed at [facility] [address] [place], [place] [address]    Culture   Final    NO GROWTH 3 DAYS Performed at [facility], [place] [address] [place], [place] [address]    Report Status PENDING  Incomplete  Blood Culture (routine x 2)     Status: None (Preliminary result)   Collection Time: 05/06/21  8:26 PM   Specimen: BLOOD LEFT ARM  Result Value Ref Range Status   Specimen Description   Final    BLOOD LEFT ARM Performed at [facility], [place] [address] [place], [place] [address]    Special Requests   Final    BOTTLES DRAWN AEROBIC AND ANAEROBIC Blood Culture results may not be optimal due to an inadequate volume of blood received in culture bottles Performed at [facility] [address] [place], [place] [address]    Culture   Final    NO GROWTH 3 DAYS Performed at [facility], [place] [address] [place], [place] [address]    Report Status PENDING  Incomplete  Urine culture     Status: None   Collection Time: 05/06/21  8:26 PM   Specimen: In/Out Cath Urine  Result Value Ref Range Status   Specimen Description   Final    IN/OUT CATH URINE Performed at [facility], [place] [address], [place], [place] [address]    Special Requests   Final    NONE Performed at [facility], [place] [address] [place],  [address]    Culture   Final    NO GROWTH Performed at [facility], [place] [address], [place],  [address]    Report Status 05/08/2021 FINAL  Final      Studies: MR THORACIC SPINE W WO CONTRAST  Result Date: 05/09/2021 CLINICAL DATA:  Fever and possible discitis EXAM: MRI THORACIC WITHOUT AND WITH CONTRAST TECHNIQUE: Multiplanar and multiecho pulse sequences of the thoracic spine were obtained without and with intravenous contrast. CONTRAST:  6.21m GADAVIST GADOBUTROL 1 MMOL/ML IV SOLN COMPARISON:  02/10/2021 FINDINGS: Alignment:  Physiologic. Vertebrae: Abnormal contrast enhancement and hyperintense T2-weighted signal at the T3-4 level has decreased since the prior study. Prevertebral and epidural abnormalities have also decreased. Cord:  Normal signal and morphology. Paraspinal and other soft tissues: Small, left-greater-than-right pleural effusions. Disc levels: No spinal canal  or neural foraminal stenosis. IMPRESSION: 1. Decreased findings of discitis-osteomyelitis at the T3-4 level. 2. No spinal canal or neural foraminal stenosis. 3. Small, left-greater-than-right pleural effusions. Electronically Signed   By: Ulyses Jarred M.D.   On: 05/09/2021 00:44   MR Lumbar Spine W Wo Contrast  Result Date: 05/09/2021 CLINICAL DATA:  Fever with history of discitis EXAM: MRI LUMBAR SPINE WITHOUT AND WITH CONTRAST TECHNIQUE:  Multiplanar and multiecho pulse sequences of the lumbar spine were obtained without and with intravenous contrast. CONTRAST:  6.3m GADAVIST GADOBUTROL 1 MMOL/ML IV SOLN COMPARISON:  None. FINDINGS: Segmentation:  Standard. Alignment:  Physiologic. Vertebrae:  No fracture, evidence of discitis, or bone lesion. Conus medullaris and cauda equina: Conus extends to the L1 level. Conus and cauda equina appear normal. Paraspinal and other soft tissues: Negative. Disc levels: No spinal canal or neural foraminal stenosis. IMPRESSION: Normal lumbar spine MRI. No discitis/osteomyelitis or epidural abscess at the lumbar levels. Electronically Signed   By: KUlyses JarredM.D.   On: 05/09/2021 00:54      LFlora Lipps MD  [facility] 05/10/2021  If 7PM-7AM, please contact night-coverage

## 2021-05-10 NOTE — Progress Notes (Signed)
Pharmacy Antibiotic Note  Caitlin Peters is a 60 y.o. female admitted on 05/06/2021 with sepsis.  Upon admission, pharmacy has been consulted for Vancomycin and Cefepime dosing. Vanc was discontinued 6/14 and cefepime/flagyl contiues  Day 4 full abx - cefepime/flagyl WBC 3.4 stable SCr continues to improve Afebrile  Plan: Continue cefepime 2g IV q12h per current renal function Metronidazole per MD Monitor renal function, cultures, clinical course Management per ID  Height: 5' 8"  (172.7 cm) Weight: 67.2 kg (148 lb 3.2 oz) IBW/kg (Calculated) : 63.9  Temp (24hrs), Avg:98.5 F (36.9 C), Min:98.4 F (36.9 C), Max:98.6 F (37 C)  Recent Labs  Lab 05/06/21 2010 05/07/21 1210 05/08/21 0500 05/08/21 1129 05/09/21 0359 05/10/21 0357  WBC 2.1* 5.3  --  3.5* 3.3* 3.4*  CREATININE 1.55* 1.17* 1.32* 1.23* 1.21* 1.08*  LATICACIDVEN 0.7  --   --   --   --   --      Estimated Creatinine Clearance: 55.9 mL/min (A) (by C-G formula based on SCr of 1.08 mg/dL (H)).    Allergies  Allergen Reactions   Meperidine Hives    Other reaction(s): GI Upset Due to [place]    Hyoscyamine Hives and Swelling    Legs swelling   Disorientation   Cefepime Other (See Comments)    Neurotoxicity occurring in setting of AKI. Ceftriaxone tolerated during same admit   Gabapentin Other (See Comments)    unknown   Lyrica [Pregabalin] Other (See Comments)    unknown   Topamax [Topiramate] Other (See Comments)    unknown   Zosyn [Piperacillin Sod-Tazobactam So]     Patient reports it makes her vomit, her neck stiff, and her "heart feel funny"   Fentanyl Rash    Pt is allergic to fentanyl patch related to the glue (gives her a rash) Pt states she is NOT allergic to fentanyl IV medicine   Morphine And Related Rash    Antimicrobials this admission: 6/11 Vancomycin >>  6/14 6/11 Cefepime >>   6/11 Metronidazole >>  Microbiology results: 6/11 BCx:  ngtd 6/11 UCx:  ngf  Thank you for allowing  pharmacy to be a part of this patient's care.  Adrian Saran, PharmD, BCPS Secure Chat if ?s 05/10/2021 8:38 AM

## 2021-05-11 DIAGNOSIS — A419 Sepsis, unspecified organism: Secondary | ICD-10-CM | POA: Diagnosis not present

## 2021-05-11 DIAGNOSIS — K219 Gastro-esophageal reflux disease without esophagitis: Secondary | ICD-10-CM | POA: Diagnosis not present

## 2021-05-11 DIAGNOSIS — R7989 Other specified abnormal findings of blood chemistry: Secondary | ICD-10-CM | POA: Diagnosis not present

## 2021-05-11 DIAGNOSIS — K50819 Crohn's disease of both small and large intestine with unspecified complications: Secondary | ICD-10-CM | POA: Diagnosis not present

## 2021-05-11 DIAGNOSIS — N179 Acute kidney failure, unspecified: Secondary | ICD-10-CM | POA: Diagnosis not present

## 2021-05-11 DIAGNOSIS — R509 Fever, unspecified: Secondary | ICD-10-CM | POA: Diagnosis not present

## 2021-05-11 LAB — CBC
HCT: 27.7 % — ABNORMAL LOW (ref 36.0–46.0)
Hemoglobin: 9.1 g/dL — ABNORMAL LOW (ref 12.0–15.0)
MCH: 30 pg (ref 26.0–34.0)
MCHC: 32.9 g/dL (ref 30.0–36.0)
MCV: 91.4 fL (ref 80.0–100.0)
Platelets: 164 10*3/uL (ref 150–400)
RBC: 3.03 MIL/uL — ABNORMAL LOW (ref 3.87–5.11)
RDW: 14.2 % (ref 11.5–15.5)
WBC: 3 10*3/uL — ABNORMAL LOW (ref 4.0–10.5)
nRBC: 0 % (ref 0.0–0.2)

## 2021-05-11 LAB — COMPREHENSIVE METABOLIC PANEL
ALT: 346 U/L — ABNORMAL HIGH (ref 0–44)
AST: 48 U/L — ABNORMAL HIGH (ref 15–41)
Albumin: 2.3 g/dL — ABNORMAL LOW (ref 3.5–5.0)
Alkaline Phosphatase: 200 U/L — ABNORMAL HIGH (ref 38–126)
Anion gap: 5 (ref 5–15)
BUN: 9 mg/dL (ref 6–20)
CO2: 21 mmol/L — ABNORMAL LOW (ref 22–32)
Calcium: 7 mg/dL — ABNORMAL LOW (ref 8.9–10.3)
Chloride: 111 mmol/L (ref 98–111)
Creatinine, Ser: 1.03 mg/dL — ABNORMAL HIGH (ref 0.44–1.00)
GFR, Estimated: >60 mL/min (ref >60)
Glucose, Bld: 106 mg/dL — ABNORMAL HIGH (ref 70–99)
Potassium: 3 mmol/L — ABNORMAL LOW (ref 3.5–5.1)
Sodium: 137 mmol/L (ref 135–145)
Total Bilirubin: 0.5 mg/dL (ref 0.3–1.2)
Total Protein: 5.5 g/dL — ABNORMAL LOW (ref 6.5–8.1)

## 2021-05-11 LAB — PHOSPHORUS: Phosphorus: 1.8 mg/dL — ABNORMAL LOW (ref 2.5–4.6)

## 2021-05-11 LAB — MAGNESIUM: Magnesium: 1.8 mg/dL (ref 1.7–2.4)

## 2021-05-11 MED ORDER — POTASSIUM CHLORIDE CRYS ER 20 MEQ PO TBCR
40.0000 meq | EXTENDED_RELEASE_TABLET | Freq: Once | ORAL | Status: AC
  Administered 2021-05-11: 40 meq via ORAL
  Filled 2021-05-11: qty 2

## 2021-05-11 MED ORDER — POTASSIUM CHLORIDE 10 MEQ/100ML IV SOLN
10.0000 meq | INTRAVENOUS | Status: AC
  Administered 2021-05-11 (×2): 10 meq via INTRAVENOUS
  Filled 2021-05-11 (×3): qty 100

## 2021-05-11 NOTE — Progress Notes (Signed)
PROGRESS NOTE    Caitlin Peters  YQM:[id number] DOB: Sep 18, 1961 DOA: 05/06/2021 PCP: [name], [name], MD    Brief Narrative:  Caitlin Peters  is a 60 y.o. female with history of acute pancreatitis, avascular necrosis of the bone, chronic pain syndrome, CKD stage III, Crohn's disease, GERD, hypertension, malnutrition, short gut syndrome, presented to the hospital with fever with abdominal pain, nausea.  Patient has chronic mucousy diarrhea at baseline.  Patient has been losing weight despite eating and being on TPN.  In the ED, patient was noted to be febrile with a temperature of 100.15F.  She did have leukopenia with white blood cell count of 2.1.  Patient was noted to have elevated creatinine and liver function test was also elevated.  Culture and urine cultures were sent from the ED and patient was admitted to the hospital for further evaluation and treatment.  05/10/2021 5:38 PM   Ultrasound of the liver did not show any acute findings except status postcholecystectomy.  Communicated with Dr. Graylon Good.  Patient complains of increasing abdominal pain.  Increased her Dilaudid dose.  With history of TPN poor oral intake Crohn's disease with short gut syndrome.  We will get GI opinion.  Communicated to [facility] via epic chat for consult. GI told us will not do consult as pt is [facility] pt and need to contact his GI MD.   6/16-Pt reports starting to feel better. Less abdominal pain today but also reports her abd pain is chronic post her surgery. No n/v LFT trending down  Consultants:  GI-will not see  Procedures:   Antimicrobials:  05/08/21 0800   vancomycin (VANCOREADY) IVPB 1250 mg/250 mL  Status:  Discontinued        1,250 mg 166.7 mL/hr over 90 Minutes Intravenous Every 36 hours 05/06/21 2321 05/07/21 1358     05/07/21 2200   vancomycin (VANCOREADY) IVPB 1250 mg/250 mL  Status:  Discontinued        1,250 mg 166.7 mL/hr over 90 Minutes Intravenous Every 24 hours 05/07/21 1358  05/09/21 1744    05/07/21 1000   ceFEPIme (MAXIPIME) 2 g in sodium chloride 0.9 % 100 mL IVPB        2 g 200 mL/hr over 30 Minutes Intravenous Every 12 hours 05/06/21 2321      05/07/21 0500   metroNIDAZOLE (FLAGYL) IVPB 500 mg        500 mg 100 mL/hr over 60 Minutes Intravenous Every 8 hours 05/06/21 2346      05/06/21 2000   ceFEPIme (MAXIPIME) 2 g in sodium chloride 0.9 % 100 mL IVPB        2 g 200 mL/hr over 30 Minutes Intravenous  Once 05/06/21 1949 05/06/21 2216    05/06/21 2000   metroNIDAZOLE (FLAGYL) IVPB 500 mg        500 mg 100 mL/hr over 60 Minutes Intravenous  Once 05/06/21 1949 05/06/21 2124    05/06/21 2000   vancomycin (VANCOCIN) IVPB 1000 mg/200 mL premix  Status:  Discontinued        1,000 mg 200 mL/hr over 60 Minutes Intravenous  Once 05/06/21 1949 05/06/21 1953    05/06/21 2000   vancomycin (VANCOCIN) 1,500 mg in sodium chloride 0.9 % 500 mL IVPB        1,500 mg 250 mL/hr over 120 Minutes Intravenous  Once 05/06/21 1953 05/06/21 2249               Subjective: No n/v, abd pain  little better. Reports its chronic . Overall starting to feel better  Objective: Vitals:   05/10/21 1431 05/10/21 1956 05/11/21 0411 05/11/21 1225  BP: (!) 161/74 (!) 157/73 (!) 167/78 (!) 174/77  Pulse: 75 72 73 62  Resp: 18 18 18 18   Temp: 98.6 F (37 C) 99.1 F (37.3 C) 98.6 F (37 C) 98.4 F (36.9 C)  TempSrc: Oral Oral Oral   SpO2: 99% 98% 99% 100%  Weight:      Height:        Intake/Output Summary (Last 24 hours) at 05/11/2021 1404 Last data filed at 05/11/2021 1015 Gross per 24 hour  Intake 1858.4 ml  Output --  Net 1858.4 ml   Filed Weights   05/06/21 1926  Weight: 67.2 kg    Examination:  General exam: Appears calm and comfortable  Respiratory system: Clear to auscultation. Respiratory effort normal. Cardiovascular system: S1 & S2 heard, RRR. No JVD, murmurs, rubs, gallops or clicks.  Gastrointestinal system: Abdomen is nondistended, soft and nontender.   Normal bowel sounds heard. Central nervous system: Alert and oriented.  Grossly intact  extremities: No edema Psychiatry: Mood & affect appropriate.     Data Reviewed: I have personally reviewed following labs and imaging studies  CBC: Recent Labs  Lab 05/06/21 2010 05/07/21 1210 05/08/21 1129 05/09/21 0359 05/10/21 0357 05/11/21 0403  WBC 2.1* 5.3 3.5* 3.3* 3.4* 3.0*  NEUTROABS 1.9 4.2  --   --   --   --   HGB 9.3* 8.7* 9.4* 9.0* 9.3* 9.1*  HCT 29.2* 27.3* 29.1* 28.2* 28.1* 27.7*  MCV 96.1 94.1 94.2 94.6 93.4 91.4  PLT 179 129* 147* 152 160 347   Basic Metabolic Panel: Recent Labs  Lab 05/07/21 1210 05/08/21 0500 05/08/21 1129 05/09/21 0359 05/10/21 0357 05/11/21 0403  NA 138  --  139 137 136 137  K 3.8  --  3.6 3.1* 3.2* 3.0*  CL 110  --  110 111 112* 111  CO2 21*  --  20* 20* 18* 21*  GLUCOSE 82  --  104* 86 78 106*  BUN 24*  --  16 14 13 9   CREATININE 1.17* 1.32* 1.23* 1.21* 1.08* 1.03*  CALCIUM 7.7*  --  8.3* 7.5* 7.3* 7.0*  MG 1.7  --  1.7 1.8 1.5* 1.8  PHOS 2.4*  --  2.1* 1.8* 1.4* 1.8*   GFR: Estimated Creatinine Clearance: 58.6 mL/min (A) (by C-G formula based on SCr of 1.03 mg/dL (H)). Liver Function Tests: Recent Labs  Lab 05/06/21 2010 05/07/21 1210 05/08/21 1129 05/09/21 0359 05/10/21 1828 05/11/21 0403  AST 567*  --  588* 251* 62* 48*  ALT 297*  --  1,115* 781* 398* 346*  ALKPHOS 148*  --  265* 216* 211* 200*  BILITOT 0.5  --  0.7 0.5 0.7 0.5  PROT 6.3*  --  6.4* 5.6* 5.6* 5.5*  ALBUMIN 2.6* 2.4* 2.5* 2.2* 2.2* 2.3*   No results for input(s): LIPASE, AMYLASE in the last 168 hours. No results for input(s): AMMONIA in the last 168 hours. Coagulation Profile: Recent Labs  Lab 05/06/21 2010  INR 0.9   Cardiac Enzymes: No results for input(s): CKTOTAL, CKMB, CKMBINDEX, TROPONINI in the last 168 hours. BNP (last 3 results) No results for input(s): PROBNP in the last 8760 hours. HbA1C: No results for input(s): HGBA1C in the last 72  hours. CBG: No results for input(s): GLUCAP in the last 168 hours. Lipid Profile: No results for input(s): CHOL, HDL, LDLCALC,  TRIG, CHOLHDL, LDLDIRECT in the last 72 hours. Thyroid Function Tests: No results for input(s): TSH, T4TOTAL, FREET4, T3FREE, THYROIDAB in the last 72 hours. Anemia Panel: No results for input(s): VITAMINB12, FOLATE, FERRITIN, TIBC, IRON, RETICCTPCT in the last 72 hours. Sepsis Labs: Recent Labs  Lab 05/06/21 2010  LATICACIDVEN 0.7    Recent Results (from the past 240 hour(s))  Resp Panel by RT-PCR (Flu A&B, Covid) Nasopharyngeal Swab     Status: None   Collection Time: 05/06/21  8:10 PM   Specimen: Nasopharyngeal Swab; Nasopharyngeal(NP) swabs in vial transport medium  Result Value Ref Range Status   SARS Coronavirus 2 by RT PCR NEGATIVE NEGATIVE Final    Comment: (NOTE) SARS-CoV-2 target nucleic acids are NOT DETECTED.  The SARS-CoV-2 RNA is generally detectable in upper respiratory specimens during the acute phase of infection. The lowest concentration of SARS-CoV-2 viral copies this assay can detect is 138 copies/mL. A negative result does not preclude SARS-Cov-2 infection and should not be used as the sole basis for treatment or other patient management decisions. A negative result may occur with  improper specimen collection/handling, submission of specimen other than nasopharyngeal swab, presence of viral mutation(s) within the areas targeted by this assay, and inadequate number of viral copies(<138 copies/mL). A negative result must be combined with clinical observations, patient history, and epidemiological information. The expected result is Negative.  Fact Sheet for Patients:  EntrepreneurPulse.com.au  Fact Sheet for Healthcare Providers:  IncredibleEmployment.be  This test is no t yet approved or cleared by the [place] FDA and  has been authorized for detection and/or diagnosis of SARS-CoV-2  by FDA under an Emergency Use Authorization (EUA). This EUA will remain  in effect (meaning this test can be used) for the duration of the COVID-19 declaration under Section 564(b)(1) of the Act, 21 U.S.C.section 360bbb-3(b)(1), unless the authorization is terminated  or revoked sooner.       Influenza A by PCR NEGATIVE NEGATIVE Final   Influenza B by PCR NEGATIVE NEGATIVE Final    Comment: (NOTE) The Xpert Xpress SARS-CoV-2/FLU/RSV plus assay is intended as an aid in the diagnosis of influenza from Nasopharyngeal swab specimens and should not be used as a sole basis for treatment. Nasal washings and aspirates are unacceptable for Xpert Xpress SARS-CoV-2/FLU/RSV testing.  Fact Sheet for Patients: EntrepreneurPulse.com.au  Fact Sheet for Healthcare Providers: IncredibleEmployment.be  This test is not yet approved or cleared by the [place] FDA and has been authorized for detection and/or diagnosis of SARS-CoV-2 by FDA under an Emergency Use Authorization (EUA). This EUA will remain in effect (meaning this test can be used) for the duration of the COVID-19 declaration under Section 564(b)(1) of the Act, 21 U.S.C. section 360bbb-3(b)(1), unless the authorization is terminated or revoked.  Performed at [facility], [place] [address], [place], [place] [address]   Blood Culture (routine x 2)     Status: None (Preliminary result)   Collection Time: 05/06/21  8:25 PM   Specimen: BLOOD  Result Value Ref Range Status   Specimen Description   Final    BLOOD PICC LINE FEMORAL ARTERY Performed at [place] [address] [place], [place] [address]    Special Requests   Final    BOTTLES DRAWN AEROBIC AND ANAEROBIC Blood Culture adequate volume Performed at [place] [address], [place],  [address]    Culture   Final    NO GROWTH 4 DAYS Performed at [facility], [place] [address] [place], [place] [address]    Report Status PENDING  Incomplete  Blood Culture (routine x 2)     Status: None (Preliminary result)   Collection Time: 05/06/21  8:26 PM   Specimen: BLOOD LEFT ARM  Result Value Ref Range Status   Specimen Description   Final    BLOOD LEFT ARM Performed at [facility], [place] [address], [place], [place] [address]    Special Requests   Final    BOTTLES DRAWN AEROBIC AND ANAEROBIC Blood Culture results may not be optimal due to an inadequate volume of blood received in culture bottles Performed at [place] [address] [place], [place] [address]    Culture   Final    NO GROWTH 4 DAYS Performed at [facility], [place] [address], [place], [place] [address]    Report Status PENDING  Incomplete  Urine culture     Status: None   Collection Time: 05/06/21  8:26 PM   Specimen: In/Out Cath Urine  Result Value Ref Range Status   Specimen Description   Final    IN/OUT CATH URINE Performed at [facility], [place] [address] [place], [place] [address]    Special Requests   Final    NONE Performed at [facility], [place] [address] [place], [place] [address]    Culture   Final    NO GROWTH Performed at [facility], [place] [address], [place], [place] [address]    Report Status 05/08/2021 FINAL  Final         Radiology Studies: US ABDOMEN LIMITED RUQ (LIVER/GB)  Result Date: 05/10/2021 CLINICAL DATA:  Elevated LFTs EXAM: ULTRASOUND ABDOMEN LIMITED RIGHT UPPER QUADRANT COMPARISON:  04/07/2021 FINDINGS: Gallbladder: Surgically removed Common bile duct: Diameter: 6.2 mm. Liver: No focal lesion identified. Within normal limits in parenchymal echogenicity. Portal vein is patent on color Doppler imaging with normal direction of blood flow towards the liver. Other: Multiple fluid-filled loops of bowel are noted similar to that seen on recent CT examination. IMPRESSION:  Status post cholecystectomy. No acute abnormality noted. Electronically Signed   By: Inez Catalina M.D.   On: 05/10/2021 13:14        Scheduled Meds:  amLODipine  10 mg Oral Daily   budesonide  3 mg Oral QODAY   buPROPion  200 mg Oral Daily   Chlorhexidine Gluconate Cloth  6 each Topical Daily   cycloSPORINE  1 drop Both Eyes BID   DULoxetine  90 mg Oral Daily   enoxaparin (LOVENOX) injection  40 mg Subcutaneous Q24H   hydrALAZINE  75 mg Oral TID   lactobacillus   Oral Daily   levETIRAcetam  500 mg Oral BID   metoprolol tartrate  100 mg Oral BID   pantoprazole  40 mg Oral Daily   sodium chloride flush  10-40 mL Intracatheter Q12H   sucralfate  1 g Oral TID WC & HS   Continuous Infusions:  dextrose 5% lactated ringers 100 mL/hr at 05/11/21 1300   promethazine (PHENERGAN) injection (IM or IVPB) 12.5 mg (05/11/21 0218)    Assessment & Plan:   Principal Problem:   Sepsis ([place]) Active Problems:   Crohn's disease of small & large intestine with SGS   HTN (hypertension), benign   GERD (gastroesophageal reflux disease)   Acute kidney injury superimposed on chronic kidney disease ([place])   LFTs abnormal   Sepsis Patient presented with  leukopenia, fever, elevated LFTs, acute kidney injury.  Blood cultures negative so far. Urine culture no growth so far.  Patient does have a femoral line for TPN and was replaced in May.  Infectious disease was consulted.  MRI of the thoracic and lumbar spine showed resolving discitis.   6/16-ID following  She was empirically started on IV antibiotics, no source of infection.   ID recommended to stop and observe off of antibiotics   Acute kidney injury. Improved with hydration   Transaminitis We will continue to trend LFTs.  TPN was on hold as outpatient as well due to elevated LFTs.   6/16 hepatitis panel negative  Right upper quadrant ultrasound unrevealing  LFTs improving  Continue to monitor      Crohn's/short gut syndrome. With chronic  abdominal pain  Has diarrhea chronically  Continue Imodium and antimotility agents  Continue IV fluids   Hypokalemia Likely from diarrhea K3.0 Will supplement with p.o. and IV Monitor level   Right lower extremity edema Duplex ultrasound of the lower extremities showed no evidence of DVT.   Essential hypertension Continue amlodipine, hydralazine, metoprolol.  Continue to monitor blood pressure closely.   GERD Continue PPI   Depression Continue Cymbalta and bupropion     DVT prophylaxis: Lovenox Code Status: Full Family Communication: None up at bedside Disposition Plan: Home Status is: Inpatient  Remains inpatient appropriate because:Inpatient level of care appropriate due to severity of illness  Dispo: The patient is from: Home              Anticipated d/c is to: Home              Patient currently is not medically stable to d/c.   Difficult to place patient No            LOS: 5 days   Time spent: 35 minutes with more than 50% on [place], MD [facility] Pager [phone]  If 7PM-7AM, please contact night-coverage 05/11/2021, 2:04 PM

## 2021-05-11 NOTE — Progress Notes (Addendum)
[facility] for Infectious Disease    Date of Admission:  05/06/2021      ID: Caitlin Peters is a 60 y.o. 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   HTN (hypertension), benign   GERD (gastroesophageal reflux disease)   Acute kidney injury superimposed on chronic kidney disease ([name])   LFTs abnormal    Subjective: Afebrile, abdominal pain much improved  Medications:   amLODipine  10 mg Oral Daily   budesonide  3 mg Oral QODAY   buPROPion  200 mg Oral Daily   Chlorhexidine Gluconate Cloth  6 each Topical Daily   cycloSPORINE  1 drop Both Eyes BID   DULoxetine  90 mg Oral Daily   enoxaparin (LOVENOX) injection  40 mg Subcutaneous Q24H   hydrALAZINE  75 mg Oral TID   lactobacillus   Oral Daily   levETIRAcetam  500 mg Oral BID   metoprolol tartrate  100 mg Oral BID   pantoprazole  40 mg Oral Daily   sodium chloride flush  10-40 mL Intracatheter Q12H   sucralfate  1 g Oral TID WC & HS    Objective: Vital signs in last 24 hours: Temp:  [98.4 F (36.9 C)-99.1 F (37.3 C)] 98.4 F (36.9 C) (06/16 1225) Pulse Rate:  [62-73] 62 (06/16 1225) Resp:  [18] 18 (06/16 1225) BP: (157-174)/(73-78) 174/77 (06/16 1225) SpO2:  [98 %-100 %] 100 % (06/16 1225) Physical Exam  Constitutional:  oriented to person, place, and time. appears well-developed and well-nourished. No distress.  HENT: [place]/AT, PERRLA, no scleral icterus Mouth/Throat: Oropharynx is clear and moist. No oropharyngeal exudate.  Cardiovascular: Normal rate, regular rhythm and normal heart sounds. Exam reveals no gallop and no friction rub.  No murmur heard.  Pulmonary/Chest: Effort normal and breath sounds normal. No respiratory distress.  has no wheezes.  Neck = supple, no nuchal rigidity Abdominal: Soft. Bowel sounds are normal.  exhibits no distension. There is no tenderness.  Ext: right upper  thigh central line is c/d/i Neurological: alert and oriented to person, place, and time.  Skin: Skin is warm and dry. No rash noted. No erythema.  Psychiatric: a normal mood and affect.  behavior is normal.    Lab Results Recent Labs    05/10/21 0357 05/11/21 0403  WBC 3.4* 3.0*  HGB 9.3* 9.1*  HCT 28.1* 27.7*  NA 136 137  K 3.2* 3.0*  CL 112* 111  CO2 18* 21*  BUN 13 9  CREATININE 1.08* 1.03*   Liver Panel Recent Labs    05/10/21 1828 05/11/21 0403  PROT 5.6* 5.5*  ALBUMIN 2.2* 2.3*  AST 62* 48*  ALT 398* 346*  ALKPHOS 211* 200*  BILITOT 0.7 0.5  BILIDIR 0.2  --   IBILI 0.5  --    Sedimentation Rate Recent Labs    05/09/21 0359  ESRSEDRATE 53*   C-Reactive Protein Recent Labs    05/09/21 0359  CRP 2.3*    Microbiology: reviewed Studies/Results: US ABDOMEN LIMITED RUQ (LIVER/GB)  Result Date: 05/10/2021 CLINICAL DATA:  Elevated LFTs EXAM: ULTRASOUND ABDOMEN LIMITED RIGHT UPPER QUADRANT COMPARISON:  04/07/2021 FINDINGS: Gallbladder: Surgically removed Common bile duct: Diameter: 6.2 mm. Liver: No focal lesion identified. Within normal limits in parenchymal echogenicity. Portal vein is patent on color Doppler imaging with normal direction of blood flow towards the liver. Other: Multiple fluid-filled loops of bowel are noted  similar to that seen on recent CT examination. IMPRESSION: Status post cholecystectomy. No acute abnormality noted. Electronically Signed   By: Inez Catalina M.D.   On: 05/10/2021 13:14     Assessment/Plan: Fever = resolved. Not recurred since discontinuation of abtx  Transaminitis = trending down. Not sure if completely attributed to TPN. U/Snot showing acute abnormalities. Would discussed with her [facility] gastroenterology if need to further imaging while she is hospitalized.  Chronic abd pain = improved.   Will sign off. Patient has follow up appt in our clinic  [facility] for Infectious Diseases Cell:  [phone] Pager: [phone]  05/11/2021, 5:59 PM

## 2021-05-12 DIAGNOSIS — K8689 Other specified diseases of pancreas: Secondary | ICD-10-CM

## 2021-05-12 DIAGNOSIS — D72819 Decreased white blood cell count, unspecified: Secondary | ICD-10-CM

## 2021-05-12 DIAGNOSIS — D649 Anemia, unspecified: Secondary | ICD-10-CM

## 2021-05-12 DIAGNOSIS — R531 Weakness: Secondary | ICD-10-CM

## 2021-05-12 DIAGNOSIS — R6510 Systemic inflammatory response syndrome (SIRS) of non-infectious origin without acute organ dysfunction: Secondary | ICD-10-CM | POA: Diagnosis not present

## 2021-05-12 DIAGNOSIS — R7989 Other specified abnormal findings of blood chemistry: Secondary | ICD-10-CM

## 2021-05-12 DIAGNOSIS — E876 Hypokalemia: Secondary | ICD-10-CM

## 2021-05-12 DIAGNOSIS — K759 Inflammatory liver disease, unspecified: Secondary | ICD-10-CM

## 2021-05-12 DIAGNOSIS — N179 Acute kidney failure, unspecified: Secondary | ICD-10-CM | POA: Diagnosis not present

## 2021-05-12 DIAGNOSIS — K912 Postsurgical malabsorption, not elsewhere classified: Secondary | ICD-10-CM

## 2021-05-12 DIAGNOSIS — R509 Fever, unspecified: Secondary | ICD-10-CM

## 2021-05-12 LAB — BASIC METABOLIC PANEL
Anion gap: 2 — ABNORMAL LOW (ref 5–15)
BUN: 6 mg/dL (ref 6–20)
CO2: 24 mmol/L (ref 22–32)
Calcium: 7.3 mg/dL — ABNORMAL LOW (ref 8.9–10.3)
Chloride: 114 mmol/L — ABNORMAL HIGH (ref 98–111)
Creatinine, Ser: 1.01 mg/dL — ABNORMAL HIGH (ref 0.44–1.00)
GFR, Estimated: >60 mL/min (ref >60)
Glucose, Bld: 101 mg/dL — ABNORMAL HIGH (ref 70–99)
Potassium: 3 mmol/L — ABNORMAL LOW (ref 3.5–5.1)
Sodium: 140 mmol/L (ref 135–145)

## 2021-05-12 LAB — CULTURE, BLOOD (ROUTINE X 2)
Culture: NO GROWTH
Culture: NO GROWTH
Special Requests: ADEQUATE

## 2021-05-12 LAB — MAGNESIUM: Magnesium: 1.6 mg/dL — ABNORMAL LOW (ref 1.7–2.4)

## 2021-05-12 MED ORDER — MAGNESIUM SULFATE 2 GM/50ML IV SOLN
2.0000 g | Freq: Once | INTRAVENOUS | Status: AC
  Administered 2021-05-12: 2 g via INTRAVENOUS
  Filled 2021-05-12: qty 50

## 2021-05-12 MED ORDER — CARVEDILOL 25 MG PO TABS
25.0000 mg | ORAL_TABLET | Freq: Two times a day (BID) | ORAL | Status: DC
  Administered 2021-05-13 – 2021-05-16 (×8): 25 mg via ORAL
  Filled 2021-05-12 (×9): qty 1

## 2021-05-12 MED ORDER — POTASSIUM CHLORIDE CRYS ER 20 MEQ PO TBCR
40.0000 meq | EXTENDED_RELEASE_TABLET | ORAL | Status: AC
  Administered 2021-05-12 (×3): 40 meq via ORAL
  Filled 2021-05-12 (×3): qty 2

## 2021-05-12 NOTE — Plan of Care (Signed)

## 2021-05-12 NOTE — TOC Initial Note (Addendum)
Transition of Care [facility]) - Initial/Assessment Note    Patient Details  Name: Caitlin Peters MRN: [id number] Date of Birth: December 15, 1960  Transition of [facility]) CM/SW Contact:    Dessa Phi, RN Phone Number: 05/12/2021, 9:36 AM  Clinical Narrative: Spoke to patient about d/c plans-already active w/[facility] nsg 1x week.Will need HHRN orders if appropriate.Family will transport home on own.             10:24a-Per [facility]-not active w/any services. 2:20p-rep [facility] active PTA with TPN.  Expected Discharge Plan: [place] Barriers to Discharge: Continued Medical Work up   Patient Goals and CMS Choice Patient states their goals for this hospitalization and ongoing recovery are:: go home CMS Medicare.gov Compare Post Acute Care list provided to:: Patient Choice offered to / list presented to : Patient  Expected Discharge Plan and Services Expected Discharge Plan: [employer]   Discharge Planning Services: CM Consult Post Acute Care Choice: [place] arrangements for the past 2 months: Single Family Home                                      Prior Living Arrangements/Services Living arrangements for the past 2 months: Single Family Home Lives with:: Adult Children   Do you feel safe going back to the place where you live?: Yes      Need for Family Participation in Patient Care: No (Comment) Care giver support system in place?: Yes (comment) Current home services: Home RN, DME (Active w/[facility] 1x week;active-[facility] formula) Criminal Activity/Legal Involvement Pertinent to Current Situation/Hospitalization: No - Comment as needed  Activities of Daily Living Home Assistive Devices/Equipment: Eyeglasses, Environmental consultant (specify type) ADL Screening (condition at time of admission) Patient's cognitive ability adequate to safely complete daily activities?: Yes Is the patient deaf or have difficulty hearing?: No Does  the patient have difficulty seeing, even when wearing glasses/contacts?: No Does the patient have difficulty concentrating, remembering, or making decisions?: No Patient able to express need for assistance with ADLs?: Yes Does the patient have difficulty dressing or bathing?: No Independently performs ADLs?: Yes (appropriate for developmental age) Does the patient have difficulty walking or climbing stairs?: No Weakness of Legs: Both Weakness of Arms/Hands: Both  Permission Sought/Granted Permission sought to share information with : Case Manager Permission granted to share information with : Yes, Verbal Permission Granted  Share Information with NAME: Case manager           Emotional Assessment Appearance:: Appears stated age Attitude/Demeanor/Rapport: Gracious Affect (typically observed): Accepting Orientation: : Oriented to Self, Oriented to Place, Oriented to  Time, Oriented to Situation Alcohol / Substance Use: Not Applicable Psych Involvement: No (comment)  Admission diagnosis:  Fever of unknown origin [R50.9] Sepsis ([place]) [A41.9] Patient Active Problem List   Diagnosis Date Noted   Sepsis ([place]) 05/06/2021   Acidosis 04/08/2021   Colitis 04/07/2021   Intractable nausea and vomiting 03/11/2021   Thoracic discitis 02/10/2021   Pressure injury of skin 12/18/2020   Bacteremia 12/17/2020   Seizures ([place]) 12/17/2020   Drug-seeking behavior 12/16/2020   Seizure ([place]) 12/15/2020   CKD (chronic kidney disease) stage 3, GFR 30-59 ml/min ([place]) 12/06/2020   COVID-19 virus infection 12/06/2020   Protein calorie malnutrition ([place]) 12/06/2020   Palpitations 11/22/2020   PAC (premature atrial contraction) 11/22/2020   History of central line-associated bloodstream infection (CLABSI) 10/21/2020   Nausea &  vomiting 10/21/2020   Choledocholithiasis (sludge) s/p ERCP 10/2019 10/21/2020   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   Abdominal pain 71/16/5790   Acute metabolic  encephalopathy    Hypomagnesemia    Partial small bowel obstruction ([name])    Bacteremia associated with intravascular line ([employer]) 08/04/2020   Enterobacter sepsis ([place]) 07/22/2020   Anxiety 06/03/2020   Acute pancreatitis 04/13/2020   Infection due to Acinetobacter species 03/10/2020   Pancytopenia ([place]) 03/05/2020   Central line infection    Elevated liver enzymes 01/02/2020   Cholangitis    Anasarca 10/28/2019   Acute kidney injury superimposed on chronic kidney disease ([place]) 10/28/2019   Falls 10/28/2019   Malnutrition ([place])    Vitamin B12 deficiency    GERD (gastroesophageal reflux disease)    Chronic pain syndrome    Hypokalemia due to excessive gastrointestinal loss of potassium 10/13/2019   Fever    Hypokalemia 10/12/2019   Chronic diarrhea 10/12/2019   Dysuria 10/12/2019   Bilateral lower extremity edema 10/12/2019   AKI (acute kidney injury) ([place]) 10/12/2019   Fungemia 08/27/2019   IDA (iron deficiency anemia) 11/03/2018   Dilation of biliary tract 08/28/2018   Severe diarrhea 03/07/2018   LFTs abnormal 01/27/2018   GI tract obstruction ([place]) 01/27/2018   Gram-negative bacteremia 09/13/2017   HTN (hypertension), benign 12/02/2016   Intractable pain 12/02/2016   Anemia 12/02/2016   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   Avascular necrosis ([place]) 06/14/2015   Polyarthralgia 05/03/2015   On total parenteral nutrition (TPN) 12/30/2014   Osteoporosis 12/24/2014   Low back pain 12/16/2014   Vitamin D deficiency 12/16/2014   Short gut syndrome 07/15/2014   History of colonic diverticulitis 2014   Depression 07/24/2012   Gram-positive bacteremia 07/24/2012   Small bowel obstruction due to adhesions ([place]) 07/24/2012   Diarrhea 12/13/2011   Neuralgia and neuritis 06/01/2011   Myalgia and myositis 08/12/2003   Crohn's disease of small & large intestine with [place]   PCP:  [name], [name], MD Pharmacy:   [facility], [address] [place] [place]. [place] [address] Phone: [phone] Fax: [phone]     Social Determinants of Health (SDOH) Interventions    Readmission Risk Interventions Readmission Risk Prevention Plan 02/10/2021 12/21/2020 11/09/2020  Transportation Screening Complete Complete Complete  PCP or Specialist Appt within 3-5 Days - - -  Not Complete comments - - -  HRI or [place] for [place] consult not completed comments - - -  Palliative Care Screening - - -  Medication Review Press photographer) Complete - Complete  PCP or Specialist appointment within 3-5 days of discharge - Complete Complete  HRI or Home Care Consult Complete Complete Complete  SW Recovery Care/Counseling Consult Complete Complete -  Palliative Care Screening Not Applicable - Not [place] Not Applicable - Not Applicable

## 2021-05-12 NOTE — Progress Notes (Signed)
PROGRESS NOTE  MIRRANDA MONRROY LAG:[id number] DOB: 1961-10-23   PCP: [name], [name], MD  Patient is from: Home.  Reports is using walker at baseline.  DOA: 05/06/2021 LOS: 6  Chief complaints:  Chief Complaint  Patient presents with   Abdominal Pain     Brief Narrative / Interim history: 60 year old F with PMH of pancreatitis on TPN, Crohn's disease, short gut syndrome, malnutrition, CKD-3, chronic pain syndrome, AVN, HTN and GERD presenting with fever, nausea, abdominal pain and unintentional weight loss and admitted with SIRS, AKI and elevated liver enzymes.  She was febrile to 100.6 and had leukopenia to 2.1 on presentation.  Liver ultrasound unrevealing.  Initially started on broad-spectrum antibiotics, which were discontinued by ID as there was no clear source of infection.  AKI and LFT resolving.  Subjective: Seen and examined earlier this morning.  No major events overnight of this morning.  She reports abdominal pain.  Pain is diffuse.  She rates her pain 8/10.  She also reports nausea but no emesis.  She denies UTI symptoms.  Objective: Vitals:   05/11/21 0411 05/11/21 1225 05/11/21 2124 05/12/21 0453  BP: (!) 167/78 (!) 174/77 (!) 164/77 (!) 163/79  Pulse: 73 62 68 70  Resp: 18 18 18 18   Temp: 98.6 F (37 C) 98.4 F (36.9 C) 98.3 F (36.8 C) 98.3 F (36.8 C)  TempSrc: Oral  Oral Oral  SpO2: 99% 100% 99% 99%  Weight:      Height:       No intake or output data in the 24 hours ending 05/12/21 1453 Filed Weights   05/06/21 1926  Weight: 67.2 kg    Examination:  GENERAL: No apparent distress.  Nontoxic. HEENT: MMM.  Vision and hearing grossly intact.  NECK: Supple.  No apparent JVD.  RESP:  No IWOB.  Fair aeration bilaterally. CVS:  RRR. Heart sounds normal.  ABD/GI/GU: BS+. Abd soft.  Diffuse tenderness. MSK/EXT:  Moves extremities. No apparent deformity. No edema.  SKIN: no apparent skin lesion or wound NEURO: Awake, alert and oriented  appropriately.  No apparent focal neuro deficit. PSYCH: Calm. Normal affect.   Procedures:  None  Microbiology summarized: [id number] and influenza PCR nonreactive. Blood and urine cultures negative.  Assessment & Plan: SIRS with fever and leukopenia without clear source of infection.  Infectious work-up unrevealing.  Remains stable off antibiotics.  Sepsis ruled out.  Acute kidney injury/azotemia: Resolving. Recent Labs    04/13/21 0414 04/14/21 0608 04/15/21 0630 05/06/21 2010 05/07/21 1210 05/08/21 0500 05/08/21 1129 05/09/21 0359 05/10/21 0357 05/11/21 0403 05/12/21 0413  BUN 42* 37* 33* 37* 24*  --  16 14 13 9 6   CREATININE 1.26* 1.02* 1.02* 1.55* 1.17* 1.32* 1.23* 1.21* 1.08* 1.03* 1.01*  -Continue IV fluid hydration    Elevated liver enzymes: Resolving.  Due to TPN?  Liver ultrasound without significant finding. Recent Labs  Lab 05/06/21 2010 05/07/21 1210 05/08/21 1129 05/09/21 0359 05/10/21 1828 05/11/21 0403  AST 567*  --  588* 251* 62* 48*  ALT 297*  --  1,115* 781* 398* 346*  ALKPHOS 148*  --  265* 216* 211* 200*  BILITOT 0.5  --  0.7 0.5 0.7 0.5  PROT 6.3*  --  6.4* 5.6* 5.6* 5.5*  ALBUMIN 2.6* 2.4* 2.5* 2.2* 2.2* 2.3*  -Continue holding TPN -Trend LFT   History of Crohn's Short gut syndrome//pancreatic insufficiency/chronic diarrhea Chronic abdominal pain -Continue Imodium and antimotility agents -Resume home Gattex if available in the pharmacy -  Continue home Creon -Continue IV fluids -Hold TPN in the setting of LFT  Normocytic anemia/anemia of chronic disease: stable Recent Labs    04/10/21 0605 04/12/21 0422 04/13/21 1229 04/14/21 0608 05/06/21 2010 05/07/21 1210 05/08/21 1129 05/09/21 0359 05/10/21 0357 05/11/21 0403  HGB 10.7* 8.8* 8.8* 8.9* 9.3* 8.7* 9.4* 9.0* 9.3* 9.1*  -Check anemia panel   Leukopenia: Stable. -Check anemia panel   Hypokalemia/hypomagnesemia -Replenish and recheck.   Right lower extremity edema: LE  [place] negative for DVT.  History of seizure?  Stable. -Continue home Keppra    Essential hypertension: BP slightly elevated.  Partly due to IV fluid. -Continue amlodipine, hydralazine -Change metoprolol to carvedilol for better blood pressure control   GERD -Continue PPI   Anxiety and depression: Stable. -Continue Cymbalta and bupropion  Generalized weakness/debility: Reports using walker at baseline. -PT/OT  Body mass index is 22.53 kg/m.         DVT prophylaxis:  enoxaparin (LOVENOX) injection 40 mg Start: 05/07/21 1000 SCDs Start: 05/06/21 2347  Code Status: Full code Family Communication: Patient and/or RN. Available if any question.  Level of care: Progressive Status is: Inpatient  Remains inpatient appropriate because:Hemodynamically unstable, Persistent severe electrolyte disturbances, Ongoing active pain requiring inpatient pain management, IV treatments appropriate due to intensity of illness or inability to take PO, and Inpatient level of care appropriate due to severity of illness  Dispo: The patient is from: Home              Anticipated d/c is to: Home              Patient currently is not medically stable to d/c.   Difficult to place patient No       Consultants:  Infectious disease   Sch Meds:  Scheduled Meds:  amLODipine  10 mg Oral Daily   budesonide  3 mg Oral QODAY   buPROPion  200 mg Oral Daily   Chlorhexidine Gluconate Cloth  6 each Topical Daily   cycloSPORINE  1 drop Both Eyes BID   DULoxetine  90 mg Oral Daily   enoxaparin (LOVENOX) injection  40 mg Subcutaneous Q24H   hydrALAZINE  75 mg Oral TID   lactobacillus   Oral Daily   levETIRAcetam  500 mg Oral BID   metoprolol tartrate  100 mg Oral BID   pantoprazole  40 mg Oral Daily   potassium chloride  40 mEq Oral Q3H   sodium chloride flush  10-40 mL Intracatheter Q12H   sucralfate  1 g Oral TID WC & HS   Continuous Infusions:  dextrose 5% lactated ringers 100 mL/hr at 05/12/21  1100   promethazine (PHENERGAN) injection (IM or IVPB) 12.5 mg (05/11/21 1619)   PRN Meds:.acetaminophen **OR** acetaminophen, diphenoxylate-atropine, famotidine, HYDROcodone-acetaminophen, HYDROmorphone (DILAUDID) injection, lipase/protease/amylase, loperamide, naloxone, ondansetron **OR** ondansetron (ZOFRAN) IV, promethazine (PHENERGAN) injection (IM or IVPB), sodium chloride flush  Antimicrobials: Anti-infectives (From admission, onward)    Start     Dose/Rate Route Frequency Ordered Stop   05/08/21 0800  vancomycin (VANCOREADY) IVPB 1250 mg/250 mL  Status:  Discontinued        1,250 mg 166.7 mL/hr over 90 Minutes Intravenous Every 36 hours 05/06/21 2321 05/07/21 1358   05/07/21 2200  vancomycin (VANCOREADY) IVPB 1250 mg/250 mL  Status:  Discontinued        1,250 mg 166.7 mL/hr over 90 Minutes Intravenous Every 24 hours 05/07/21 1358 05/09/21 1744   05/07/21 1000  ceFEPIme (MAXIPIME) 2 g in sodium chloride 0.9 %  100 mL IVPB  Status:  Discontinued        2 g 200 mL/hr over 30 Minutes Intravenous Every 12 hours 05/06/21 2321 05/10/21 1417   05/07/21 0500  metroNIDAZOLE (FLAGYL) IVPB 500 mg  Status:  Discontinued        500 mg 100 mL/hr over 60 Minutes Intravenous Every 8 hours 05/06/21 2346 05/10/21 1417   05/06/21 2000  ceFEPIme (MAXIPIME) 2 g in sodium chloride 0.9 % 100 mL IVPB        2 g 200 mL/hr over 30 Minutes Intravenous  Once 05/06/21 1949 05/06/21 2216   05/06/21 2000  metroNIDAZOLE (FLAGYL) IVPB 500 mg        500 mg 100 mL/hr over 60 Minutes Intravenous  Once 05/06/21 1949 05/06/21 2124   05/06/21 2000  vancomycin (VANCOCIN) IVPB 1000 mg/200 mL premix  Status:  Discontinued        1,000 mg 200 mL/hr over 60 Minutes Intravenous  Once 05/06/21 1949 05/06/21 1953   05/06/21 2000  vancomycin (VANCOCIN) 1,500 mg in sodium chloride 0.9 % 500 mL IVPB        1,500 mg 250 mL/hr over 120 Minutes Intravenous  Once 05/06/21 1953 05/06/21 2249        I have personally reviewed  the following labs and images: CBC: Recent Labs  Lab 05/06/21 2010 05/07/21 1210 05/08/21 1129 05/09/21 0359 05/10/21 0357 05/11/21 0403  WBC 2.1* 5.3 3.5* 3.3* 3.4* 3.0*  NEUTROABS 1.9 4.2  --   --   --   --   HGB 9.3* 8.7* 9.4* 9.0* 9.3* 9.1*  HCT 29.2* 27.3* 29.1* 28.2* 28.1* 27.7*  MCV 96.1 94.1 94.2 94.6 93.4 91.4  PLT 179 129* 147* 152 160 164   BMP &GFR Recent Labs  Lab 05/07/21 1210 05/08/21 0500 05/08/21 1129 05/09/21 0359 05/10/21 0357 05/11/21 0403 05/12/21 0413  NA 138  --  139 137 136 137 140  K 3.8  --  3.6 3.1* 3.2* 3.0* 3.0*  CL 110  --  110 111 112* 111 114*  CO2 21*  --  20* 20* 18* 21* 24  GLUCOSE 82  --  104* 86 78 106* 101*  BUN 24*  --  16 14 13 9 6   CREATININE 1.17*   < > 1.23* 1.21* 1.08* 1.03* 1.01*  CALCIUM 7.7*  --  8.3* 7.5* 7.3* 7.0* 7.3*  MG 1.7  --  1.7 1.8 1.5* 1.8 1.6*  PHOS 2.4*  --  2.1* 1.8* 1.4* 1.8*  --    < > = values in this interval not displayed.   Estimated Creatinine Clearance: 59.8 mL/min (A) (by C-G formula based on SCr of 1.01 mg/dL (H)). Liver & Pancreas: Recent Labs  Lab 05/06/21 2010 05/07/21 1210 05/08/21 1129 05/09/21 0359 05/10/21 1828 05/11/21 0403  AST 567*  --  588* 251* 62* 48*  ALT 297*  --  1,115* 781* 398* 346*  ALKPHOS 148*  --  265* 216* 211* 200*  BILITOT 0.5  --  0.7 0.5 0.7 0.5  PROT 6.3*  --  6.4* 5.6* 5.6* 5.5*  ALBUMIN 2.6* 2.4* 2.5* 2.2* 2.2* 2.3*   No results for input(s): LIPASE, AMYLASE in the last 168 hours. No results for input(s): AMMONIA in the last 168 hours. Diabetic: No results for input(s): HGBA1C in the last 72 hours. No results for input(s): GLUCAP in the last 168 hours. Cardiac Enzymes: No results for input(s): CKTOTAL, CKMB, CKMBINDEX, TROPONINI in the last 168 hours. No  results for input(s): PROBNP in the last 8760 hours. Coagulation Profile: Recent Labs  Lab 05/06/21 2010  INR 0.9   Thyroid Function Tests: No results for input(s): TSH, T4TOTAL, FREET4, T3FREE,  THYROIDAB in the last 72 hours. Lipid Profile: No results for input(s): CHOL, HDL, LDLCALC, TRIG, CHOLHDL, LDLDIRECT in the last 72 hours. Anemia Panel: No results for input(s): VITAMINB12, FOLATE, FERRITIN, TIBC, IRON, RETICCTPCT in the last 72 hours. Urine analysis:    Component Value Date/Time   COLORURINE YELLOW 05/06/2021 2026   APPEARANCEUR CLEAR 05/06/2021 2026   LABSPEC 1.014 05/06/2021 2026   PHURINE 8.0 05/06/2021 2026   GLUCOSEU NEGATIVE 05/06/2021 2026   HGBUR NEGATIVE 05/06/2021 2026   BILIRUBINUR NEGATIVE 05/06/2021 2026   KETONESUR NEGATIVE 05/06/2021 2026   PROTEINUR >=300 (A) 05/06/2021 2026   NITRITE NEGATIVE 05/06/2021 2026   LEUKOCYTESUR NEGATIVE 05/06/2021 2026   Sepsis Labs: Invalid input(s): PROCALCITONIN, [place]  Microbiology: Recent Results (from the past 240 hour(s))  Resp Panel by RT-PCR (Flu A&B, Covid) Nasopharyngeal Swab     Status: None   Collection Time: 05/06/21  8:10 PM   Specimen: Nasopharyngeal Swab; Nasopharyngeal(NP) swabs in vial transport medium  Result Value Ref Range Status   SARS Coronavirus 2 by RT PCR NEGATIVE NEGATIVE Final    Comment: (NOTE) SARS-CoV-2 target nucleic acids are NOT DETECTED.  The SARS-CoV-2 RNA is generally detectable in upper respiratory specimens during the acute phase of infection. The lowest concentration of SARS-CoV-2 viral copies this assay can detect is 138 copies/mL. A negative result does not preclude SARS-Cov-2 infection and should not be used as the sole basis for treatment or other patient management decisions. A negative result may occur with  improper specimen collection/handling, submission of specimen other than nasopharyngeal swab, presence of viral mutation(s) within the areas targeted by this assay, and inadequate number of viral copies(<138 copies/mL). A negative result must be combined with clinical observations, patient history, and epidemiological information. The expected result is  Negative.  Fact Sheet for Patients:  EntrepreneurPulse.com.au  Fact Sheet for Healthcare Providers:  IncredibleEmployment.be  This test is no t yet approved or cleared by the [place] FDA and  has been authorized for detection and/or diagnosis of SARS-CoV-2 by FDA under an Emergency Use Authorization (EUA). This EUA will remain  in effect (meaning this test can be used) for the duration of the COVID-19 declaration under Section 564(b)(1) of the Act, 21 U.S.C.section 360bbb-3(b)(1), unless the authorization is terminated  or revoked sooner.       Influenza A by PCR NEGATIVE NEGATIVE Final   Influenza B by PCR NEGATIVE NEGATIVE Final    Comment: (NOTE) The Xpert Xpress SARS-CoV-2/FLU/RSV plus assay is intended as an aid in the diagnosis of influenza from Nasopharyngeal swab specimens and should not be used as a sole basis for treatment. Nasal washings and aspirates are unacceptable for Xpert Xpress SARS-CoV-2/FLU/RSV testing.  Fact Sheet for Patients: EntrepreneurPulse.com.au  Fact Sheet for Healthcare Providers: IncredibleEmployment.be  This test is not yet approved or cleared by the [place] FDA and has been authorized for detection and/or diagnosis of SARS-CoV-2 by FDA under an Emergency Use Authorization (EUA). This EUA will remain in effect (meaning this test can be used) for the duration of the COVID-19 declaration under Section 564(b)(1) of the Act, 21 U.S.C. section 360bbb-3(b)(1), unless the authorization is terminated or revoked.  Performed at [facility], [place] [address] [place], [place] [address]   Blood Culture (routine x 2)  Status: None   Collection Time: 05/06/21  8:25 PM   Specimen: BLOOD  Result Value Ref Range Status   Specimen Description   Final    BLOOD PICC LINE FEMORAL ARTERY Performed at [facility] [address]  [place], [place] [address]    Special Requests   Final    BOTTLES DRAWN AEROBIC AND ANAEROBIC Blood Culture adequate volume Performed at [facility] [address], [place], [place] [address]    Culture   Final    NO GROWTH 5 DAYS Performed at [facility], [place] [address] [place], [place] [address]    Report Status 05/12/2021 FINAL  Final  Blood Culture (routine x 2)     Status: None   Collection Time: 05/06/21  8:26 PM   Specimen: BLOOD LEFT ARM  Result Value Ref Range Status   Specimen Description   Final    BLOOD LEFT ARM Performed at [facility], [place] [address] [place], [place] [address]    Special Requests   Final    BOTTLES DRAWN AEROBIC AND ANAEROBIC Blood Culture results may not be optimal due to an inadequate volume of blood received in culture bottles Performed at [facility] [address] [place], [place] [address]    Culture   Final    NO GROWTH 5 DAYS Performed at [facility], [place] [address], [place], [place] [address]    Report Status 05/12/2021 FINAL  Final  Urine culture     Status: None   Collection Time: 05/06/21  8:26 PM   Specimen: In/Out Cath Urine  Result Value Ref Range Status   Specimen Description   Final    IN/OUT CATH URINE Performed at [facility] [address] [place], [place] [address]    Special Requests   Final    NONE Performed at [facility], [place] [address] [place], [place] [address]    Culture   Final    NO GROWTH Performed at [facility], [place] [address], [place], [place] [address]    Report Status 05/08/2021 FINAL  Final    Radiology Studies: No results found.     Jonta [place]  If 7PM-7AM, please contact night-coverage [email or web address] 05/12/2021, 2:53 PM

## 2021-05-12 NOTE — Care Management Important Message (Signed)
Important Message  Patient Details IM Letter given to the Patient. Name: Caitlin Peters MRN: [id number] Date of Birth: [birth year]   Medicare Important Message Given:  Yes     [name] 05/12/2021, 9:53 AM

## 2021-05-13 DIAGNOSIS — R7989 Other specified abnormal findings of blood chemistry: Secondary | ICD-10-CM | POA: Diagnosis not present

## 2021-05-13 DIAGNOSIS — R6510 Systemic inflammatory response syndrome (SIRS) of non-infectious origin without acute organ dysfunction: Secondary | ICD-10-CM | POA: Diagnosis not present

## 2021-05-13 DIAGNOSIS — R509 Fever, unspecified: Secondary | ICD-10-CM | POA: Diagnosis not present

## 2021-05-13 DIAGNOSIS — N179 Acute kidney failure, unspecified: Secondary | ICD-10-CM | POA: Diagnosis not present

## 2021-05-13 LAB — IRON AND TIBC
Iron: 60 ug/dL (ref 28–170)
Saturation Ratios: 22 % (ref 10.4–31.8)
TIBC: 278 ug/dL (ref 250–450)
UIBC: 218 ug/dL

## 2021-05-13 LAB — CBC WITH DIFFERENTIAL/PLATELET
Abs Immature Granulocytes: 0.01 10*3/uL (ref 0.00–0.07)
Basophils Absolute: 0 10*3/uL (ref 0.0–0.1)
Basophils Relative: 0 %
Eosinophils Absolute: 0.1 10*3/uL (ref 0.0–0.5)
Eosinophils Relative: 3 %
HCT: 28.7 % — ABNORMAL LOW (ref 36.0–46.0)
Hemoglobin: 9.7 g/dL — ABNORMAL LOW (ref 12.0–15.0)
Immature Granulocytes: 0 %
Lymphocytes Relative: 52 %
Lymphs Abs: 2.3 10*3/uL (ref 0.7–4.0)
MCH: 30.9 pg (ref 26.0–34.0)
MCHC: 33.8 g/dL (ref 30.0–36.0)
MCV: 91.4 fL (ref 80.0–100.0)
Monocytes Absolute: 0.2 10*3/uL (ref 0.1–1.0)
Monocytes Relative: 5 %
Neutro Abs: 1.8 10*3/uL (ref 1.7–7.7)
Neutrophils Relative %: 40 %
Platelets: 202 10*3/uL (ref 150–400)
RBC: 3.14 MIL/uL — ABNORMAL LOW (ref 3.87–5.11)
RDW: 15 % (ref 11.5–15.5)
WBC: 4.4 10*3/uL (ref 4.0–10.5)
nRBC: 0 % (ref 0.0–0.2)

## 2021-05-13 LAB — FOLATE: Folate: 22.4 ng/mL (ref >5.9)

## 2021-05-13 LAB — COMPREHENSIVE METABOLIC PANEL
ALT: 206 U/L — ABNORMAL HIGH (ref 0–44)
AST: 35 U/L (ref 15–41)
Albumin: 2.5 g/dL — ABNORMAL LOW (ref 3.5–5.0)
Alkaline Phosphatase: 189 U/L — ABNORMAL HIGH (ref 38–126)
Anion gap: 3 — ABNORMAL LOW (ref 5–15)
BUN: <5 mg/dL — ABNORMAL LOW (ref 6–20)
CO2: 23 mmol/L (ref 22–32)
Calcium: 7.9 mg/dL — ABNORMAL LOW (ref 8.9–10.3)
Chloride: 112 mmol/L — ABNORMAL HIGH (ref 98–111)
Creatinine, Ser: 0.94 mg/dL (ref 0.44–1.00)
GFR, Estimated: >60 mL/min (ref >60)
Glucose, Bld: 108 mg/dL — ABNORMAL HIGH (ref 70–99)
Potassium: 4.1 mmol/L (ref 3.5–5.1)
Sodium: 138 mmol/L (ref 135–145)
Total Bilirubin: 0.5 mg/dL (ref 0.3–1.2)
Total Protein: 5.8 g/dL — ABNORMAL LOW (ref 6.5–8.1)

## 2021-05-13 LAB — VITAMIN B12: Vitamin B-12: 3085 pg/mL — ABNORMAL HIGH (ref 180–914)

## 2021-05-13 LAB — PHOSPHORUS: Phosphorus: 1.1 mg/dL — ABNORMAL LOW (ref 2.5–4.6)

## 2021-05-13 LAB — LIPASE, BLOOD: Lipase: 31 U/L (ref 11–51)

## 2021-05-13 LAB — RETICULOCYTES
Immature Retic Fract: 13.6 % (ref 2.3–15.9)
RBC.: 3.17 MIL/uL — ABNORMAL LOW (ref 3.87–5.11)
Retic Count, Absolute: 41.5 10*3/uL (ref 19.0–186.0)
Retic Ct Pct: 1.3 % (ref 0.4–3.1)

## 2021-05-13 LAB — MAGNESIUM: Magnesium: 1.6 mg/dL — ABNORMAL LOW (ref 1.7–2.4)

## 2021-05-13 LAB — FERRITIN: Ferritin: 16 ng/mL (ref 11–307)

## 2021-05-13 MED ORDER — POTASSIUM PHOSPHATES 15 MMOLE/5ML IV SOLN
30.0000 mmol | Freq: Once | INTRAVENOUS | Status: AC
  Administered 2021-05-13: 30 mmol via INTRAVENOUS
  Filled 2021-05-13: qty 10

## 2021-05-13 MED ORDER — SODIUM CHLORIDE 0.9 % IV SOLN: INTRAVENOUS | Status: DC | PRN

## 2021-05-13 MED ORDER — MAGNESIUM SULFATE 2 GM/50ML IV SOLN
2.0000 g | Freq: Once | INTRAVENOUS | Status: AC
  Administered 2021-05-13: 2 g via INTRAVENOUS
  Filled 2021-05-13: qty 50

## 2021-05-13 NOTE — Progress Notes (Signed)
PROGRESS NOTE  Caitlin Peters DOB: 1961-08-23   PCP: [name], [name], MD  Patient is from: Home.  Reports is using walker at baseline.  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  She was febrile to 100.6 and had leukopenia to 2.1 on presentation.  Liver ultrasound unrevealing.  Initially started on broad-spectrum antibiotics, which were discontinued by ID as there was no clear source of infection.  AKI and LFT resolving.  Restarting TPN.  Subjective: Seen and examined earlier this morning.  No major events overnight of this morning.  No complaints.  Pain improved.  Denies chest pain or dyspnea.  Objective: Vitals:   05/12/21 1533 05/12/21 2128 05/13/21 0448 05/13/21 1220  BP: (!) 164/80 (!) 185/85 (!) 170/86 (!) 161/81  Pulse: 66 67 71 70  Resp: 20 18 18 19   Temp: 98.2 F (36.8 C) 98.5 F (36.9 C) 98.5 F (36.9 C) 98.5 F (36.9 C)  TempSrc:  Oral Oral Oral  SpO2: 100% 100% 99% 100%  Weight:      Height:        Intake/Output Summary (Last 24 hours) at 05/13/2021 1316 Last data filed at 05/13/2021 1005 Gross per 24 hour  Intake 4220.7 ml  Output --  Net 4220.7 ml   Filed Weights   05/06/21 1926  Weight: 67.2 kg    Examination:  GENERAL: No apparent distress.  Nontoxic. HEENT: MMM.  Vision and hearing grossly intact.  NECK: Supple.  No apparent JVD.  RESP: On RA.  No IWOB.  Fair aeration bilaterally. CVS:  RRR. Heart sounds normal.  ABD/GI/GU: BS+. Abd soft.  Diffuse tenderness.  MSK/EXT:  Moves extremities. No apparent deformity. No edema.  SKIN: no apparent skin  lesion or wound NEURO: Awake and alert. Oriented appropriately.  No apparent focal neuro deficit. PSYCH: Calm. Normal affect.   Procedures:  None  Microbiology summarized: [id number] and influenza PCR nonreactive. Blood and urine cultures negative.  Assessment & Plan: SIRS with fever and leukopenia without clear source of infection.  Infectious work-up unrevealing.  Remains stable off antibiotics.  Sepsis ruled out.  Acute kidney injury/azotemia: Resolved. Recent Labs    04/14/21 0608 04/15/21 0630 05/06/21 2010 05/07/21 1210 05/08/21 0500 05/08/21 1129 05/09/21 0359 05/10/21 0357 05/11/21 0403 05/12/21 0413 05/13/21 0500  BUN 37* 33* 37* 24*  --  16 14 13 9 6  <5*  CREATININE 1.02* 1.02* 1.55* 1.17* 1.32* 1.23* 1.21* 1.08* 1.03* 1.01* 0.94  -Continue IV fluid hydration    Elevated liver enzymes: Resolving.  Due to TPN?  RUQ [place] without significant finding. Recent Labs  Lab 05/08/21 1129 05/09/21 0359 05/10/21 1828 05/11/21 0403 05/13/21 0500  AST 588* 251* 62* 48* 35  ALT 1,115* 781* 398* 346* 206*  ALKPHOS 265* 216* 211* 200* 189*  BILITOT 0.7 0.5 0.7 0.5 0.5  PROT 6.4* 5.6* 5.6* 5.5* 5.8*  ALBUMIN 2.5* 2.2* 2.2* 2.3* 2.5*  -Resume TPN -Trend LFT   History of Crohn's Short gut syndrome//pancreatic insufficiency/chronic diarrhea Chronic abdominal pain -Continue Imodium and antimotility agents -Resume home Gattex if patient can bring from home -Continue home Creon -Continue IV fluids  Normocytic  anemia/anemia of chronic disease: stable.  Has low ferritin. Recent Labs    04/12/21 0422 04/13/21 1229 04/14/21 0608 05/06/21 2010 05/07/21 1210 05/08/21 1129 05/09/21 0359 05/10/21 0357 05/11/21 0403 05/13/21 0500  HGB 8.8* 8.8* 8.9* 9.3* 8.7* 9.4* 9.0* 9.3* 9.1* 9.7*  -Monitor H&H   Leukopenia: Resolved.   Hypokalemia/hypomagnesemia/hypophosphatemia: Hypokalemia resolved.  P 1.1.  Mg 1.6. -IV potassium phosphate 30 mmol x 1 -IV magnesium sulfate 2  g x 1   Right lower extremity edema: LE [place] negative for DVT.  History of seizure?  Stable. -Continue home Keppra    Essential hypertension: BP slightly elevated.  Partly due to IV fluid.  Also on Cymbalta which could drive. -Decrease IV fluid -Continue amlodipine and hydralazine -Changed metoprolol to carvedilol for better BP control.   GERD -Continue PPI   Anxiety and depression: Stable. -Continue Cymbalta and bupropion  Generalized weakness/debility: Reports using walker at baseline. -PT/OT  Body mass index is 22.53 kg/m.         DVT prophylaxis:  enoxaparin (LOVENOX) injection 40 mg Start: 05/07/21 1000 SCDs Start: 05/06/21 2347  Code Status: Full code Family Communication: Patient and/or RN. Available if any question.  Level of care: Progressive Status is: Inpatient  Remains inpatient appropriate because:Persistent severe electrolyte disturbances, IV treatments appropriate due to intensity of illness or inability to take PO, and Inpatient level of care appropriate due to severity of illness  Dispo: The patient is from: Home              Anticipated d/c is to: Home              Patient currently is not medically stable to d/c.   Difficult to place patient No       Consultants:  Infectious disease   Sch Meds:  Scheduled Meds:  amLODipine  10 mg Oral Daily   budesonide  3 mg Oral QODAY   buPROPion  200 mg Oral Daily   carvedilol  25 mg Oral BID WC   Chlorhexidine Gluconate Cloth  6 each Topical Daily   cycloSPORINE  1 drop Both Eyes BID   DULoxetine  90 mg Oral Daily   enoxaparin (LOVENOX) injection  40 mg Subcutaneous Q24H   hydrALAZINE  75 mg Oral TID   lactobacillus   Oral Daily   levETIRAcetam  500 mg Oral BID   pantoprazole  40 mg Oral Daily   sodium chloride flush  10-40 mL Intracatheter Q12H   sucralfate  1 g Oral TID WC & HS   Continuous Infusions:  sodium chloride 10 mL/hr at 05/13/21 0820   dextrose 5% lactated ringers 100 mL/hr at  05/13/21 0817   potassium PHOSPHATE IVPB (in mmol) 30 mmol (05/13/21 0958)   promethazine (PHENERGAN) injection (IM or IVPB) 12.5 mg (05/13/21 1242)   PRN Meds:.sodium chloride, acetaminophen **OR** acetaminophen, diphenoxylate-atropine, famotidine, HYDROcodone-acetaminophen, HYDROmorphone (DILAUDID) injection, lipase/protease/amylase, loperamide, naloxone, ondansetron **OR** ondansetron (ZOFRAN) IV, promethazine (PHENERGAN) injection (IM or IVPB), sodium chloride flush  Antimicrobials: Anti-infectives (From admission, onward)    Start     Dose/Rate Route Frequency Ordered Stop   05/08/21 0800  vancomycin (VANCOREADY) IVPB 1250 mg/250 mL  Status:  Discontinued        1,250 mg 166.7 mL/hr over 90 Minutes Intravenous Every 36 hours 05/06/21 2321 05/07/21 1358   05/07/21 2200  vancomycin (VANCOREADY) IVPB 1250 mg/250 mL  Status:  Discontinued        1,250 mg 166.7 mL/hr over 90 Minutes Intravenous Every 24  hours 05/07/21 1358 05/09/21 1744   05/07/21 1000  ceFEPIme (MAXIPIME) 2 g in sodium chloride 0.9 % 100 mL IVPB  Status:  Discontinued        2 g 200 mL/hr over 30 Minutes Intravenous Every 12 hours 05/06/21 2321 05/10/21 1417   05/07/21 0500  metroNIDAZOLE (FLAGYL) IVPB 500 mg  Status:  Discontinued        500 mg 100 mL/hr over 60 Minutes Intravenous Every 8 hours 05/06/21 2346 05/10/21 1417   05/06/21 2000  ceFEPIme (MAXIPIME) 2 g in sodium chloride 0.9 % 100 mL IVPB        2 g 200 mL/hr over 30 Minutes Intravenous  Once 05/06/21 1949 05/06/21 2216   05/06/21 2000  metroNIDAZOLE (FLAGYL) IVPB 500 mg        500 mg 100 mL/hr over 60 Minutes Intravenous  Once 05/06/21 1949 05/06/21 2124   05/06/21 2000  vancomycin (VANCOCIN) IVPB 1000 mg/200 mL premix  Status:  Discontinued        1,000 mg 200 mL/hr over 60 Minutes Intravenous  Once 05/06/21 1949 05/06/21 1953   05/06/21 2000  vancomycin (VANCOCIN) 1,500 mg in sodium chloride 0.9 % 500 mL IVPB        1,500 mg 250 mL/hr over 120 Minutes  Intravenous  Once 05/06/21 1953 05/06/21 2249        I have personally reviewed the following labs and images: CBC: Recent Labs  Lab 05/06/21 2010 05/07/21 1210 05/08/21 1129 05/09/21 0359 05/10/21 0357 05/11/21 0403 05/13/21 0500  WBC 2.1* 5.3 3.5* 3.3* 3.4* 3.0* 4.4  NEUTROABS 1.9 4.2  --   --   --   --  1.8  HGB 9.3* 8.7* 9.4* 9.0* 9.3* 9.1* 9.7*  HCT 29.2* 27.3* 29.1* 28.2* 28.1* 27.7* 28.7*  MCV 96.1 94.1 94.2 94.6 93.4 91.4 91.4  PLT 179 129* 147* 152 160 164 202   BMP &GFR Recent Labs  Lab 05/08/21 1129 05/09/21 0359 05/10/21 0357 05/11/21 0403 05/12/21 0413 05/13/21 0500  NA 139 137 136 137 140 138  K 3.6 3.1* 3.2* 3.0* 3.0* 4.1  CL 110 111 112* 111 114* 112*  CO2 20* 20* 18* 21* 24 23  GLUCOSE 104* 86 78 106* 101* 108*  BUN 16 14 13 9 6  <5*  CREATININE 1.23* 1.21* 1.08* 1.03* 1.01* 0.94  CALCIUM 8.3* 7.5* 7.3* 7.0* 7.3* 7.9*  MG 1.7 1.8 1.5* 1.8 1.6* 1.6*  PHOS 2.1* 1.8* 1.4* 1.8*  --  1.1*   Estimated Creatinine Clearance: 64.2 mL/min (by C-G formula based on SCr of 0.94 mg/dL). Liver & Pancreas: Recent Labs  Lab 05/08/21 1129 05/09/21 0359 05/10/21 1828 05/11/21 0403 05/13/21 0500  AST 588* 251* 62* 48* 35  ALT 1,115* 781* 398* 346* 206*  ALKPHOS 265* 216* 211* 200* 189*  BILITOT 0.7 0.5 0.7 0.5 0.5  PROT 6.4* 5.6* 5.6* 5.5* 5.8*  ALBUMIN 2.5* 2.2* 2.2* 2.3* 2.5*   Recent Labs  Lab 05/13/21 0500  LIPASE 31   No results for input(s): AMMONIA in the last 168 hours. Diabetic: No results for input(s): HGBA1C in the last 72 hours. No results for input(s): GLUCAP in the last 168 hours. Cardiac Enzymes: No results for input(s): CKTOTAL, CKMB, CKMBINDEX, TROPONINI in the last 168 hours. No results for input(s): PROBNP in the last 8760 hours. Coagulation Profile: Recent Labs  Lab 05/06/21 2010  INR 0.9   Thyroid Function Tests: No results for input(s): TSH, T4TOTAL, FREET4, T3FREE, THYROIDAB in the last  72 hours. Lipid Profile: No  results for input(s): CHOL, HDL, LDLCALC, TRIG, CHOLHDL, LDLDIRECT in the last 72 hours. Anemia Panel: Recent Labs    05/13/21 0500  VITAMINB12 3,085*  FOLATE 22.4  FERRITIN 16  TIBC 278  IRON 60  RETICCTPCT 1.3   Urine analysis:    Component Value Date/Time   COLORURINE YELLOW 05/06/2021 2026   APPEARANCEUR CLEAR 05/06/2021 2026   LABSPEC 1.014 05/06/2021 2026   PHURINE 8.0 05/06/2021 2026   GLUCOSEU NEGATIVE 05/06/2021 2026   HGBUR NEGATIVE 05/06/2021 2026   BILIRUBINUR NEGATIVE 05/06/2021 2026   KETONESUR NEGATIVE 05/06/2021 2026   PROTEINUR >=300 (A) 05/06/2021 2026   NITRITE NEGATIVE 05/06/2021 2026   LEUKOCYTESUR NEGATIVE 05/06/2021 2026   Sepsis Labs: Invalid input(s): PROCALCITONIN, [place]  Microbiology: Recent Results (from the past 240 hour(s))  Resp Panel by RT-PCR (Flu A&B, Covid) Nasopharyngeal Swab     Status: None   Collection Time: 05/06/21  8:10 PM   Specimen: Nasopharyngeal Swab; Nasopharyngeal(NP) swabs in vial transport medium  Result Value Ref Range Status   SARS Coronavirus 2 by RT PCR NEGATIVE NEGATIVE Final    Comment: (NOTE) SARS-CoV-2 target nucleic acids are NOT DETECTED.  The SARS-CoV-2 RNA is generally detectable in upper respiratory specimens during the acute phase of infection. The lowest concentration of SARS-CoV-2 viral copies this assay can detect is 138 copies/mL. A negative result does not preclude SARS-Cov-2 infection and should not be used as the sole basis for treatment or other patient management decisions. A negative result may occur with  improper specimen collection/handling, submission of specimen other than nasopharyngeal swab, presence of viral mutation(s) within the areas targeted by this assay, and inadequate number of viral copies(<138 copies/mL). A negative result must be combined with clinical observations, patient history, and epidemiological information. The expected result is Negative.  Fact Sheet for  Patients:  EntrepreneurPulse.com.au  Fact Sheet for Healthcare Providers:  IncredibleEmployment.be  This test is no t yet approved or cleared by the [place] FDA and  has been authorized for detection and/or diagnosis of SARS-CoV-2 by FDA under an Emergency Use Authorization (EUA). This EUA will remain  in effect (meaning this test can be used) for the duration of the COVID-19 declaration under Section 564(b)(1) of the Act, 21 U.S.C.section 360bbb-3(b)(1), unless the authorization is terminated  or revoked sooner.       Influenza A by PCR NEGATIVE NEGATIVE Final   Influenza B by PCR NEGATIVE NEGATIVE Final    Comment: (NOTE) The Xpert Xpress SARS-CoV-2/FLU/RSV plus assay is intended as an aid in the diagnosis of influenza from Nasopharyngeal swab specimens and should not be used as a sole basis for treatment. Nasal washings and aspirates are unacceptable for Xpert Xpress SARS-CoV-2/FLU/RSV testing.  Fact Sheet for Patients: EntrepreneurPulse.com.au  Fact Sheet for Healthcare Providers: IncredibleEmployment.be  This test is not yet approved or cleared by the [place] FDA and has been authorized for detection and/or diagnosis of SARS-CoV-2 by FDA under an Emergency Use Authorization (EUA). This EUA will remain in effect (meaning this test can be used) for the duration of the COVID-19 declaration under Section 564(b)(1) of the Act, 21 U.S.C. section 360bbb-3(b)(1), unless the authorization is terminated or revoked.  Performed at [facility], [place] [address] [place], [place] [address]   Blood Culture (routine x 2)     Status: None   Collection Time: 05/06/21  8:25 PM   Specimen: BLOOD  Result Value Ref Range Status   Specimen  Description   Final    BLOOD PICC LINE FEMORAL ARTERY Performed at [facility], [place] [address], [place], [place] [address]     Special Requests   Final    BOTTLES DRAWN AEROBIC AND ANAEROBIC Blood Culture adequate volume Performed at [place] [address], [place], [place] [address]    Culture   Final    NO GROWTH 5 DAYS Performed at [facility], [place] [address], [place], [place] [address]    Report Status 05/12/2021 FINAL  Final  Blood Culture (routine x 2)     Status: None   Collection Time: 05/06/21  8:26 PM   Specimen: BLOOD LEFT ARM  Result Value Ref Range Status   Specimen Description   Final    BLOOD LEFT ARM Performed at [facility], [place] [address], [place], [place] [address]    Special Requests   Final    BOTTLES DRAWN AEROBIC AND ANAEROBIC Blood Culture results may not be optimal due to an inadequate volume of blood received in culture bottles Performed at [place] [address] [place], [place] [address]    Culture   Final    NO GROWTH 5 DAYS Performed at [facility], [place] [address], [place], [place] [address]    Report Status 05/12/2021 FINAL  Final  Urine culture     Status: None   Collection Time: 05/06/21  8:26 PM   Specimen: In/Out Cath Urine  Result Value Ref Range Status   Specimen Description   Final    IN/OUT CATH URINE Performed at [place] [address], [place], [place] [address]    Special Requests   Final    NONE Performed at [facility], [place] [address], [place], [place] [address]    Culture   Final    NO GROWTH Performed at [facility], [place] [address], [place],  [address]    Report Status 05/08/2021 FINAL  Final    Radiology Studies: No results found.     Caitlin Peters T. [place]  If 7PM-7AM, please contact night-coverage [email or web address] 05/13/2021, 1:16 PM

## 2021-05-14 DIAGNOSIS — R509 Fever, unspecified: Secondary | ICD-10-CM | POA: Diagnosis not present

## 2021-05-14 DIAGNOSIS — R6510 Systemic inflammatory response syndrome (SIRS) of non-infectious origin without acute organ dysfunction: Secondary | ICD-10-CM | POA: Diagnosis not present

## 2021-05-14 DIAGNOSIS — N179 Acute kidney failure, unspecified: Secondary | ICD-10-CM | POA: Diagnosis not present

## 2021-05-14 DIAGNOSIS — A419 Sepsis, unspecified organism: Secondary | ICD-10-CM | POA: Diagnosis not present

## 2021-05-14 LAB — DIFFERENTIAL
Abs Immature Granulocytes: 0.01 10*3/uL (ref 0.00–0.07)
Basophils Absolute: 0 10*3/uL (ref 0.0–0.1)
Basophils Relative: 0 %
Eosinophils Absolute: 0.1 10*3/uL (ref 0.0–0.5)
Eosinophils Relative: 3 %
Immature Granulocytes: 0 %
Lymphocytes Relative: 49 %
Lymphs Abs: 1.9 10*3/uL (ref 0.7–4.0)
Monocytes Absolute: 0.2 10*3/uL (ref 0.1–1.0)
Monocytes Relative: 5 %
Neutro Abs: 1.7 10*3/uL (ref 1.7–7.7)
Neutrophils Relative %: 43 %

## 2021-05-14 LAB — CBC
HCT: 28.2 % — ABNORMAL LOW (ref 36.0–46.0)
Hemoglobin: 9.3 g/dL — ABNORMAL LOW (ref 12.0–15.0)
MCH: 30.4 pg (ref 26.0–34.0)
MCHC: 33 g/dL (ref 30.0–36.0)
MCV: 92.2 fL (ref 80.0–100.0)
Platelets: 198 10*3/uL (ref 150–400)
RBC: 3.06 MIL/uL — ABNORMAL LOW (ref 3.87–5.11)
RDW: 14.7 % (ref 11.5–15.5)
WBC: 3.9 10*3/uL — ABNORMAL LOW (ref 4.0–10.5)
nRBC: 0 % (ref 0.0–0.2)

## 2021-05-14 LAB — MAGNESIUM: Magnesium: 1.7 mg/dL (ref 1.7–2.4)

## 2021-05-14 LAB — PHOSPHORUS: Phosphorus: 2.3 mg/dL — ABNORMAL LOW (ref 2.5–4.6)

## 2021-05-14 LAB — COMPREHENSIVE METABOLIC PANEL
ALT: 146 U/L — ABNORMAL HIGH (ref 0–44)
AST: 26 U/L (ref 15–41)
Albumin: 2.4 g/dL — ABNORMAL LOW (ref 3.5–5.0)
Alkaline Phosphatase: 170 U/L — ABNORMAL HIGH (ref 38–126)
Anion gap: 3 — ABNORMAL LOW (ref 5–15)
BUN: 5 mg/dL — ABNORMAL LOW (ref 6–20)
CO2: 23 mmol/L (ref 22–32)
Calcium: 7.6 mg/dL — ABNORMAL LOW (ref 8.9–10.3)
Chloride: 111 mmol/L (ref 98–111)
Creatinine, Ser: 1.06 mg/dL — ABNORMAL HIGH (ref 0.44–1.00)
GFR, Estimated: >60 mL/min (ref >60)
Glucose, Bld: 106 mg/dL — ABNORMAL HIGH (ref 70–99)
Potassium: 3.5 mmol/L (ref 3.5–5.1)
Sodium: 137 mmol/L (ref 135–145)
Total Bilirubin: 0.5 mg/dL (ref 0.3–1.2)
Total Protein: 5.4 g/dL — ABNORMAL LOW (ref 6.5–8.1)

## 2021-05-14 LAB — PREALBUMIN: Prealbumin: 23.8 mg/dL (ref 18–38)

## 2021-05-14 LAB — TRIGLYCERIDES: Triglycerides: 63 mg/dL (ref <150)

## 2021-05-14 MED ORDER — TRAVASOL 10 % IV SOLN
INTRAVENOUS | Status: AC
  Filled 2021-05-14: qty 480

## 2021-05-14 MED ORDER — DEXTROSE IN LACTATED RINGERS 5 % IV SOLN: INTRAVENOUS | Status: DC

## 2021-05-14 MED ORDER — POTASSIUM PHOSPHATES 15 MMOLE/5ML IV SOLN
20.0000 mmol | Freq: Once | INTRAVENOUS | Status: AC
  Administered 2021-05-14: 20 mmol via INTRAVENOUS
  Filled 2021-05-14: qty 6.67

## 2021-05-14 NOTE — Progress Notes (Signed)
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   Assessment: Patient's home TPN has been on hold since admission on 6/12 due to elevated LFTs. Now with improvement in LFTs, will resume TPN per Md orders. Note that due to TPN being held since admission and unknown whether TPN was sole reason for LFT elevation, will start TPN dosing over and start with continuous TPN then transition to cyclic as tolerated  Glucose / Insulin: am glucoses at goal < 150 Electrolytes: K 3.5, phos 2.3, mag 1.7 Renal: SCr 1.06 stable, BUN 5 Hepatic: AST/ALT 26/146 Alb/prealbumin: alb 2.4, prealb 23.8 Intake / Output; MIVF: n/a yet, D5LR at 75 ml/hr GI Imaging: GI Surgeries / Procedures:   Central access: PICC line TPN start date: 6/19  Nutritional Goals (per RD recommendation on 5/20, will readjust goals as per new RD rec's): kCal: 1800-2000, Protein: 90-100, Fluid: >= 1.8 L  Goal TPN rate is 80 mL/hr (provides 96 g of protein and 1939 kcals per day)  Current Nutrition:  Regular diet  Plan:  Kphos 26mol x 1 now  Start TPN at 40 mL/hr at 1800 Electrolytes in TPN: Na 540m/L, K 5060mL, Ca 5mE66m, Mg 5mEq55m and Phos 15mmo58m Cl:Ac 1:1 Add standard MVI and trace elements to TPN Initiate Sensitive q6h SSI and adjust as needed  Reduce MIVF to 40 mL/hr at 1800 Monitor TPN labs on Mon/Thurs and as needed  Kattaleya Alia,Kara Mead2022,10:40 AM

## 2021-05-14 NOTE — Progress Notes (Signed)
PROGRESS NOTE  Caitlin Peters:[id number] DOB: 05/04/1961   PCP: [name], [name], MD  Patient is from: Home.  Reports is using walker at baseline.  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  She was febrile to 100.6 and had leukopenia to 2.1 on presentation.  Liver ultrasound unrevealing.  Initially started on broad-spectrum antibiotics, which were discontinued by ID as there was no clear source of infection.  AKI and LFT resolving.  Restarting TPN on 6/19.  Subjective: Seen and examined earlier this morning.  No major events overnight of this morning.  No complaints other than the usual abdominal pain.  No nausea or vomiting.  No UTI symptoms.  No chest pain or dyspnea.  Has not started TPN last night.  Objective: Vitals:   05/13/21 0448 05/13/21 1220 05/13/21 1952 05/14/21 0433  BP: (!) 170/86 (!) 161/81 (!) 145/67 (!) 151/77  Pulse: 71 70 83 77  Resp: 18 19 20 20   Temp: 98.5 F (36.9 C) 98.5 F (36.9 C) 98.7 F (37.1 C) 98.4 F (36.9 C)  TempSrc: Oral Oral Oral Oral  SpO2: 99% 100% 97% 99%  Weight:      Height:        Intake/Output Summary (Last 24 hours) at 05/14/2021 1153 Last data filed at 05/14/2021 1000 Gross per 24 hour  Intake 5672.25 ml  Output 1400 ml  Net 4272.25 ml   Filed Weights   05/06/21 1926  Weight: 67.2 kg    Examination:   GENERAL: No apparent distress.  Nontoxic. HEENT: MMM.  Vision and hearing grossly intact.  NECK: Supple.  No apparent JVD.  RESP: On RA.  No IWOB.  Fair aeration bilaterally. CVS:  RRR. Heart sounds normal.  ABD/GI/GU: BS+. Abd soft.   Diffuse tenderness. MSK/EXT:  Moves extremities. No apparent deformity. No edema.  SKIN: no apparent skin lesion or wound NEURO: Awake and alert. Oriented appropriately.  No apparent focal neuro deficit. PSYCH: Calm. Normal affect.   Procedures:  None  Microbiology summarized: ZYSAY-30 and influenza PCR nonreactive. Blood and urine cultures negative.  Assessment & Plan: SIRS with fever and leukopenia without clear source of infection.  Infectious work-up unrevealing.  Remains stable off antibiotics.  Sepsis ruled out.  Acute kidney injury/azotemia: Resolved. Recent Labs    04/15/21 0630 05/06/21 2010 05/07/21 1210 05/08/21 0500 05/08/21 1129 05/09/21 0359 05/10/21 0357 05/11/21 0403 05/12/21 0413 05/13/21 0500 05/14/21 0441  BUN 33* 37* 24*  --  16 14 13 9 6  <5* 5*  CREATININE 1.02* 1.55* 1.17* 1.32* 1.23* 1.21* 1.08* 1.03* 1.01* 0.94 1.06*  -Continue IV fluid hydration    Elevated liver enzymes: Resolving.  Due to TPN?  RUQ [place] without significant finding. Recent Labs  Lab 05/09/21 0359 05/10/21 1828 05/11/21 0403 05/13/21 0500 05/14/21 0441  AST 251* 62* 48* 35 26  ALT 781* 398* 346* 206* 146*  ALKPHOS 216* 211* 200* 189* 170*  BILITOT 0.5 0.7 0.5 0.5 0.5  PROT 5.6* 5.6* 5.5* 5.8* 5.4*  ALBUMIN 2.2* 2.2* 2.3* 2.5* 2.4*  -Resume TPN-pharmacy planning to start continuous and transition to cyclic -Trend LFT   History of Crohn's  Short gut syndrome//pancreatic insufficiency/chronic diarrhea Chronic abdominal pain -Continue Imodium and antimotility agents -Resume home Gattex if patient can bring from home -Continue home Creon -Continue IV fluids -Start TPN.  Normocytic anemia/anemia of chronic disease: stable.  Has low ferritin. Recent Labs    04/13/21 1229 04/14/21 0608 05/06/21 2010 05/07/21 1210 05/08/21 1129 05/09/21 0359 05/10/21 0357 05/11/21 0403 05/13/21 0500 05/14/21 0441  HGB 8.8* 8.9* 9.3* 8.7* 9.4* 9.0* 9.3* 9.1* 9.7* 9.3*  -Monitor  H&H   Leukopenia: Resolved.   Hypokalemia/hypomagnesemia/hypophosphatemia:  -Pharmacy replacing as appropriate.   Right lower extremity edema: LE [place] negative for DVT.  History of seizure?  Stable. -Continue home Keppra   Essential hypertension: BP slightly elevated this morning. -Continue current dose of amlodipine, Coreg and hydralazine.   GERD -Continue PPI   Anxiety and depression: Stable. -Continue Cymbalta and bupropion  Generalized weakness/debility: Reports using walker at baseline. -OOB/PT/OT  Body mass index is 22.53 kg/m.         DVT prophylaxis:  enoxaparin (LOVENOX) injection 40 mg Start: 05/07/21 1000 SCDs Start: 05/06/21 2347  Code Status: Full code Family Communication: Patient and/or RN. Available if any question.  Level of care: Progressive Status is: Inpatient  Remains inpatient appropriate because:Persistent severe electrolyte disturbances, IV treatments appropriate due to intensity of illness or inability to take PO, and Inpatient level of care appropriate due to severity of illness  Dispo: The patient is from: Home              Anticipated d/c is to: Home              Patient currently is not medically stable to d/c.   Difficult to place patient No       Consultants:  Infectious disease   Sch Meds:  Scheduled Meds:  amLODipine  10 mg Oral Daily   budesonide  3 mg Oral QODAY   buPROPion  200 mg Oral Daily   carvedilol  25 mg Oral BID WC   Chlorhexidine Gluconate Cloth  6 each Topical Daily   cycloSPORINE  1 drop Both Eyes BID   DULoxetine  90 mg Oral Daily   enoxaparin (LOVENOX) injection  40 mg Subcutaneous Q24H   hydrALAZINE  75 mg Oral TID   lactobacillus   Oral Daily   levETIRAcetam  500 mg Oral BID   pantoprazole  40 mg Oral Daily   sodium chloride flush  10-40 mL Intracatheter Q12H   sucralfate  1 g Oral TID WC & HS   Continuous Infusions:  sodium chloride 10 mL/hr at 05/13/21 0820   dextrose 5% lactated ringers 75  mL/hr at 05/14/21 0829   dextrose 5% lactated ringers     potassium PHOSPHATE IVPB (in mmol)     promethazine (PHENERGAN) injection (IM or IVPB) 12.5 mg (05/13/21 1242)   TPN ADULT (ION)     PRN Meds:.sodium chloride, acetaminophen **OR** acetaminophen, diphenoxylate-atropine, famotidine, HYDROcodone-acetaminophen, HYDROmorphone (DILAUDID) injection, lipase/protease/amylase, loperamide, naloxone, ondansetron **OR** ondansetron (ZOFRAN) IV, promethazine (PHENERGAN) injection (IM or IVPB), sodium chloride flush  Antimicrobials: Anti-infectives (From admission, onward)    Start     Dose/Rate Route Frequency Ordered Stop   05/08/21 0800  vancomycin (VANCOREADY) IVPB 1250 mg/250 mL  Status:  Discontinued        1,250 mg 166.7 mL/hr over 90 Minutes Intravenous Every 36 hours 05/06/21 2321 05/07/21 1358   05/07/21 2200  vancomycin (VANCOREADY) IVPB 1250 mg/250 mL  Status:  Discontinued        1,250  mg 166.7 mL/hr over 90 Minutes Intravenous Every 24 hours 05/07/21 1358 05/09/21 1744   05/07/21 1000  ceFEPIme (MAXIPIME) 2 g in sodium chloride 0.9 % 100 mL IVPB  Status:  Discontinued        2 g 200 mL/hr over 30 Minutes Intravenous Every 12 hours 05/06/21 2321 05/10/21 1417   05/07/21 0500  metroNIDAZOLE (FLAGYL) IVPB 500 mg  Status:  Discontinued        500 mg 100 mL/hr over 60 Minutes Intravenous Every 8 hours 05/06/21 2346 05/10/21 1417   05/06/21 2000  ceFEPIme (MAXIPIME) 2 g in sodium chloride 0.9 % 100 mL IVPB        2 g 200 mL/hr over 30 Minutes Intravenous  Once 05/06/21 1949 05/06/21 2216   05/06/21 2000  metroNIDAZOLE (FLAGYL) IVPB 500 mg        500 mg 100 mL/hr over 60 Minutes Intravenous  Once 05/06/21 1949 05/06/21 2124   05/06/21 2000  vancomycin (VANCOCIN) IVPB 1000 mg/200 mL premix  Status:  Discontinued        1,000 mg 200 mL/hr over 60 Minutes Intravenous  Once 05/06/21 1949 05/06/21 1953   05/06/21 2000  vancomycin (VANCOCIN) 1,500 mg in sodium chloride 0.9 % 500 mL IVPB         1,500 mg 250 mL/hr over 120 Minutes Intravenous  Once 05/06/21 1953 05/06/21 2249        I have personally reviewed the following labs and images: CBC: Recent Labs  Lab 05/07/21 1210 05/08/21 1129 05/09/21 0359 05/10/21 0357 05/11/21 0403 05/13/21 0500 05/14/21 0441  WBC 5.3   < > 3.3* 3.4* 3.0* 4.4 3.9*  NEUTROABS 4.2  --   --   --   --  1.8 1.7  HGB 8.7*   < > 9.0* 9.3* 9.1* 9.7* 9.3*  HCT 27.3*   < > 28.2* 28.1* 27.7* 28.7* 28.2*  MCV 94.1   < > 94.6 93.4 91.4 91.4 92.2  PLT 129*   < > 152 160 164 202 198   < > = values in this interval not displayed.   BMP &GFR Recent Labs  Lab 05/09/21 0359 05/10/21 0357 05/11/21 0403 05/12/21 0413 05/13/21 0500 05/14/21 0441  NA 137 136 137 140 138 137  K 3.1* 3.2* 3.0* 3.0* 4.1 3.5  CL 111 112* 111 114* 112* 111  CO2 20* 18* 21* 24 23 23   GLUCOSE 86 78 106* 101* 108* 106*  BUN 14 13 9 6  <5* 5*  CREATININE 1.21* 1.08* 1.03* 1.01* 0.94 1.06*  CALCIUM 7.5* 7.3* 7.0* 7.3* 7.9* 7.6*  MG 1.8 1.5* 1.8 1.6* 1.6* 1.7  PHOS 1.8* 1.4* 1.8*  --  1.1* 2.3*   Estimated Creatinine Clearance: 56.9 mL/min (A) (by C-G formula based on SCr of 1.06 mg/dL (H)). Liver & Pancreas: Recent Labs  Lab 05/09/21 0359 05/10/21 1828 05/11/21 0403 05/13/21 0500 05/14/21 0441  AST 251* 62* 48* 35 26  ALT 781* 398* 346* 206* 146*  ALKPHOS 216* 211* 200* 189* 170*  BILITOT 0.5 0.7 0.5 0.5 0.5  PROT 5.6* 5.6* 5.5* 5.8* 5.4*  ALBUMIN 2.2* 2.2* 2.3* 2.5* 2.4*   Recent Labs  Lab 05/13/21 0500  LIPASE 31   No results for input(s): AMMONIA in the last 168 hours. Diabetic: No results for input(s): HGBA1C in the last 72 hours. No results for input(s): GLUCAP in the last 168 hours. Cardiac Enzymes: No results for input(s): CKTOTAL, CKMB, CKMBINDEX, TROPONINI in the last 168 hours. No  results for input(s): PROBNP in the last 8760 hours. Coagulation Profile: No results for input(s): INR, PROTIME in the last 168 hours.  Thyroid Function  Tests: No results for input(s): TSH, T4TOTAL, FREET4, T3FREE, THYROIDAB in the last 72 hours. Lipid Profile: Recent Labs    05/14/21 0441  TRIG 63   Anemia Panel: Recent Labs    05/13/21 0500  VITAMINB12 3,085*  FOLATE 22.4  FERRITIN 16  TIBC 278  IRON 60  RETICCTPCT 1.3   Urine analysis:    Component Value Date/Time   COLORURINE YELLOW 05/06/2021 2026   APPEARANCEUR CLEAR 05/06/2021 2026   LABSPEC 1.014 05/06/2021 2026   PHURINE 8.0 05/06/2021 2026   GLUCOSEU NEGATIVE 05/06/2021 2026   HGBUR NEGATIVE 05/06/2021 2026   BILIRUBINUR NEGATIVE 05/06/2021 2026   KETONESUR NEGATIVE 05/06/2021 2026   PROTEINUR >=300 (A) 05/06/2021 2026   NITRITE NEGATIVE 05/06/2021 2026   LEUKOCYTESUR NEGATIVE 05/06/2021 2026   Sepsis Labs: Invalid input(s): PROCALCITONIN, [place]  Microbiology: Recent Results (from the past 240 hour(s))  Resp Panel by RT-PCR (Flu A&B, Covid) Nasopharyngeal Swab     Status: None   Collection Time: 05/06/21  8:10 PM   Specimen: Nasopharyngeal Swab; Nasopharyngeal(NP) swabs in vial transport medium  Result Value Ref Range Status   SARS Coronavirus 2 by RT PCR NEGATIVE NEGATIVE Final    Comment: (NOTE) SARS-CoV-2 target nucleic acids are NOT DETECTED.  The SARS-CoV-2 RNA is generally detectable in upper respiratory specimens during the acute phase of infection. The lowest concentration of SARS-CoV-2 viral copies this assay can detect is 138 copies/mL. A negative result does not preclude SARS-Cov-2 infection and should not be used as the sole basis for treatment or other patient management decisions. A negative result may occur with  improper specimen collection/handling, submission of specimen other than nasopharyngeal swab, presence of viral mutation(s) within the areas targeted by this assay, and inadequate number of viral copies(<138 copies/mL). A negative result must be combined with clinical observations, patient history, and  epidemiological information. The expected result is Negative.  Fact Sheet for Patients:  EntrepreneurPulse.com.au  Fact Sheet for Healthcare Providers:  IncredibleEmployment.be  This test is no t yet approved or cleared by the [place] FDA and  has been authorized for detection and/or diagnosis of SARS-CoV-2 by FDA under an Emergency Use Authorization (EUA). This EUA will remain  in effect (meaning this test can be used) for the duration of the COVID-19 declaration under Section 564(b)(1) of the Act, 21 U.S.C.section 360bbb-3(b)(1), unless the authorization is terminated  or revoked sooner.       Influenza A by PCR NEGATIVE NEGATIVE Final   Influenza B by PCR NEGATIVE NEGATIVE Final    Comment: (NOTE) The Xpert Xpress SARS-CoV-2/FLU/RSV plus assay is intended as an aid in the diagnosis of influenza from Nasopharyngeal swab specimens and should not be used as a sole basis for treatment. Nasal washings and aspirates are unacceptable for Xpert Xpress SARS-CoV-2/FLU/RSV testing.  Fact Sheet for Patients: EntrepreneurPulse.com.au  Fact Sheet for Healthcare Providers: IncredibleEmployment.be  This test is not yet approved or cleared by the [place] FDA and has been authorized for detection and/or diagnosis of SARS-CoV-2 by FDA under an Emergency Use Authorization (EUA). This EUA will remain in effect (meaning this test can be used) for the duration of the COVID-19 declaration under Section 564(b)(1) of the Act, 21 U.S.C. section 360bbb-3(b)(1), unless the authorization is terminated or revoked.  Performed at [facility], [address] [place], [place]  [address]   Blood Culture (routine x 2)     Status: None   Collection Time: 05/06/21  8:25 PM   Specimen: BLOOD  Result Value Ref Range Status   Specimen Description   Final    BLOOD PICC LINE FEMORAL ARTERY Performed at  [facility] [address], [place], [place] [address]    Special Requests   Final    BOTTLES DRAWN AEROBIC AND ANAEROBIC Blood Culture adequate volume Performed at [facility] [address] [place], [place] [address]    Culture   Final    NO GROWTH 5 DAYS Performed at [facility], [place] [address] [place], [place] [address]    Report Status 05/12/2021 FINAL  Final  Blood Culture (routine x 2)     Status: None   Collection Time: 05/06/21  8:26 PM   Specimen: BLOOD LEFT ARM  Result Value Ref Range Status   Specimen Description   Final    BLOOD LEFT ARM Performed at [facility], [place] [address], [place], [place] [address]    Special Requests   Final    BOTTLES DRAWN AEROBIC AND ANAEROBIC Blood Culture results may not be optimal due to an inadequate volume of blood received in culture bottles Performed at [facility] [address], [place], [place] [address]    Culture   Final    NO GROWTH 5 DAYS Performed at [facility], [place] [address] [place], [place] [address]    Report Status 05/12/2021 FINAL  Final  Urine culture     Status: None   Collection Time: 05/06/21  8:26 PM   Specimen: In/Out Cath Urine  Result Value Ref Range Status   Specimen Description   Final    IN/OUT CATH URINE Performed at [facility] [address] [place], [name] [address]    Special Requests   Final    NONE Performed at [facility], [place] [address], [place], [place] [address]    Culture   Final    NO GROWTH Performed at [facility], [place] [address] [place], [place] [address]    Report Status 05/08/2021 FINAL  Final    Radiology Studies: No results found.     [employer]  If 7PM-7AM, please contact night-coverage [email or web address] 05/14/2021, 11:53 AM

## 2021-05-14 NOTE — Evaluation (Signed)
Physical Therapy Evaluation Patient Details Name: Caitlin Peters MRN: [id number] DOB: [birth year] Today's Date: 05/14/2021   History of Present Illness  60 yo female admitted with SIRS, abd pain. Hx of Crohns, short gut syndrome, chronic pain, chronic TPN, multiple bowel resections, Sz, CKD, panreatitis  Clinical Impression  On eval, pt was Supv for mobility. She walked ~300 feet around the unit with a RW. Pt tolerated the activity well. She was happy to mobilize. Discussed d/c plan-she will return home where she lives with her daughter. She politely declines any PT f/u.     Follow Up Recommendations No PT follow up    Equipment Recommendations  None recommended by PT    Recommendations for Other Services       Precautions / Restrictions Precautions Precautions: None Restrictions Weight Bearing Restrictions: No      Mobility  Bed Mobility Overal bed mobility: Modified Independent                  Transfers Overall transfer level: Modified independent Equipment used: Rolling walker (2 wheeled) Transfers: Sit to/from Stand              Ambulation/Gait Ambulation/Gait assistance: Supervision Gait Distance (Feet): 300 Feet Assistive device: Rolling walker (2 wheeled) Gait Pattern/deviations: Step-through pattern;Decreased stride length     General Gait Details: Supervision for safety. Fair gait speed. Pt tolerated distance well.  Stairs            Wheelchair Mobility    Modified Rankin (Stroke Patients Only)       Balance Overall balance assessment: Mild deficits observed, not formally tested                                           Pertinent Vitals/Pain Pain Assessment: Faces Faces Pain Scale: Hurts a little bit Pain Location: generalized - chronic Pain Descriptors / Indicators: Discomfort;Sore Pain Intervention(s): Monitored during session    Home Living Family/patient expects to be discharged to:: Private  residence Living Arrangements: Children Available Help at Discharge: Family;Available PRN/intermittently Type of Home: Apartment Home Access: Stairs to enter Entrance Stairs-Rails: Right Entrance Stairs-Number of Steps: 12 Home Layout: One level Home Equipment: Walker - 4 wheels;Walker - 2 wheels Additional Comments: also has IV pole at home for TPN    Prior Function Level of Independence: Needs assistance   Gait / Transfers Assistance Needed: In the house does not use AD; uses RW outside or in community and has supervision when outisde; no recent falls  ADL's / Homemaking Assistance Needed: Reports independent with ADLs except has supervision for bathing for safety  Comments: Some information provided at time of OT eval differs from information patient provided at time of PT eval.     Hand Dominance   Dominant Hand: Right    Extremity/Trunk Assessment   Upper Extremity Assessment Upper Extremity Assessment: Defer to OT evaluation    Lower Extremity Assessment Lower Extremity Assessment: Generalized weakness    Cervical / Trunk Assessment Cervical / Trunk Assessment: Normal  Communication   Communication: No difficulties  Cognition Arousal/Alertness: Awake/alert Behavior During Therapy: WFL for tasks assessed/performed Overall Cognitive Status: Within Functional Limits for tasks assessed                                 General Comments: pleasant and motivated  General Comments      Exercises     Assessment/Plan    PT Assessment Patient needs continued PT services  PT Problem List Decreased strength;Decreased mobility;Decreased activity tolerance;Decreased balance;Decreased knowledge of use of DME       PT Treatment Interventions DME instruction;Gait training;Therapeutic exercise;Functional mobility training;Therapeutic activities;Patient/family education;Balance training    PT Goals (Current goals can be found in the Care Plan section)   Acute Rehab PT Goals Patient Stated Goal: return home PT Goal Formulation: With patient Time For Goal Achievement: 05/28/21 Potential to Achieve Goals: Good    Frequency Min 3X/week   Barriers to discharge        Co-evaluation               AM-PAC PT "6 Clicks" Mobility  Outcome Measure Help needed turning from your back to your side while in a flat bed without using bedrails?: None Help needed moving from lying on your back to sitting on the side of a flat bed without using bedrails?: None Help needed moving to and from a bed to a chair (including a wheelchair)?: None Help needed standing up from a chair using your arms (e.g., wheelchair or bedside chair)?: None Help needed to walk in hospital room?: A Little Help needed climbing 3-5 steps with a railing? : A Little 6 Click Score: 22    End of Session   Activity Tolerance: Patient tolerated treatment well Patient left: in bed;with call bell/phone within reach   PT Visit Diagnosis: Unsteadiness on feet (R26.81);Muscle weakness (generalized) (M62.81)    Time: 6153-7943 PT Time Calculation (min) (ACUTE ONLY): 24 min   Charges:   PT Evaluation $PT Eval Low Complexity: 1 Low PT Treatments $Gait Training: 8-22 mins         Doreatha Massed, PT Acute Rehabilitation  Office: [phone] Pager: [phone]

## 2021-05-14 NOTE — Progress Notes (Signed)
Initial Nutrition Assessment  DOCUMENTATION CODES:   Not applicable RD working remotely.   INTERVENTION:  - TPN per Pharmacist.  - complete NFPE when feasible. - weigh today.   NUTRITION DIAGNOSIS:   Inadequate oral intake related to altered GI function as evidenced by other (comment) (need for home TPN).  GOAL:   Patient will meet greater than or equal to 90% of their needs  MONITOR:   PO intake, Weight trends, Labs, I & O's, Other (Comment) (TPN regimen)  REASON FOR ASSESSMENT:   Consult Assessment of nutrition requirement/status, New TPN/TNA  ASSESSMENT:   60 year old female with medical history of pancreatitis on TPN Crohn's disease, short gut syndrome, malnutrition,  stage 3 CKD, chronic pain syndrome, AVN, HTN, and GERD. She presented to the ED with fever, nausea, abdominal pain, and unintentional weight loss. She was admitted with SIRS, AKI, and elevated liver enzymes. She had a fever of 100.6. Plan to resume home TPN on 6/19.  Patient known to this RD from admission 1 month ago. Diet advanced from NPO to [place] on 6/12 and to Regular on 6/13. She has been consuming 0-75% at meals since 6/13.   Double lumen PICC in R femoral placed on 04/03/21. TPN order in for custom TPN @ 40 ml/hr to start this evening.   Pharmacy note from this AM indicates goal TPN rate of 80 ml/hr which will provide 1939 kcal and 96 grams protein.   Weight on 6/11 was 148 lb and patient has not been weighed since that time. Weight has been stable over the past 1 month; up over the past 2 months.  Per notes: - SIRS with fever and leukopenia, sepsis ruled out - AKI--resolved - elevated liver enzymes--resolving - hx of short gut syndrome, pancreatic insufficiency, and Crohn's disease with chronic diarrhea  - from home with plan to return home at time of d/c    Labs reviewed; BUN: 5 mg/dl, creatinine: 1.06 mg/dl, Ca: 7.6 mg/dl, Phos: 2.3 mg/dl, AST elevated. Medications reviewed; 3600 units  creon TID PRN, PNR imodium, 40 mg oral protonix/day, 20 mmol IV KPhos x1 run 6/18 and x1 run 6/19, 1 g carafate TID.  IVF; D5-LR @ 75 ml/hr (306 kcal/24 hrs).    NUTRITION - FOCUSED PHYSICAL EXAM:  Unable to complete at this time.   Diet Order:   Diet Order             Diet regular Room service appropriate? Yes; Fluid consistency: Thin  Diet effective now                   EDUCATION NEEDS:   No education needs have been identified at this time  Skin:  Skin Assessment: Reviewed RN Assessment  Last BM:  6/18 (type 7)  Height:   Ht Readings from Last 1 Encounters:  05/06/21 5' 8"  (1.727 m)    Weight:   Wt Readings from Last 1 Encounters:  05/06/21 67.2 kg      Estimated Nutritional Needs:  Kcal:  1800-2000 kcal Protein:  90-105 grams Fluid:  >/= 2 L/day       Jarome Matin, MS, RD, LDN, CNSC Inpatient Clinical Dietitian RD pager # available in AMION  After hours/weekend pager # available in [facility]

## 2021-05-15 DIAGNOSIS — N179 Acute kidney failure, unspecified: Secondary | ICD-10-CM | POA: Diagnosis not present

## 2021-05-15 DIAGNOSIS — R6510 Systemic inflammatory response syndrome (SIRS) of non-infectious origin without acute organ dysfunction: Secondary | ICD-10-CM | POA: Diagnosis not present

## 2021-05-15 DIAGNOSIS — R509 Fever, unspecified: Secondary | ICD-10-CM | POA: Diagnosis not present

## 2021-05-15 DIAGNOSIS — A419 Sepsis, unspecified organism: Secondary | ICD-10-CM | POA: Diagnosis not present

## 2021-05-15 LAB — MAGNESIUM: Magnesium: 1.7 mg/dL (ref 1.7–2.4)

## 2021-05-15 LAB — DIFFERENTIAL
Abs Immature Granulocytes: 0.01 10*3/uL (ref 0.00–0.07)
Basophils Absolute: 0 10*3/uL (ref 0.0–0.1)
Basophils Relative: 1 %
Eosinophils Absolute: 0.1 10*3/uL (ref 0.0–0.5)
Eosinophils Relative: 4 %
Immature Granulocytes: 0 %
Lymphocytes Relative: 56 %
Lymphs Abs: 1.9 10*3/uL (ref 0.7–4.0)
Monocytes Absolute: 0.2 10*3/uL (ref 0.1–1.0)
Monocytes Relative: 6 %
Neutro Abs: 1.1 10*3/uL — ABNORMAL LOW (ref 1.7–7.7)
Neutrophils Relative %: 33 %

## 2021-05-15 LAB — COMPREHENSIVE METABOLIC PANEL
ALT: 107 U/L — ABNORMAL HIGH (ref 0–44)
AST: 21 U/L (ref 15–41)
Albumin: 2.3 g/dL — ABNORMAL LOW (ref 3.5–5.0)
Alkaline Phosphatase: 165 U/L — ABNORMAL HIGH (ref 38–126)
Anion gap: 4 — ABNORMAL LOW (ref 5–15)
BUN: 10 mg/dL (ref 6–20)
CO2: 24 mmol/L (ref 22–32)
Calcium: 7.8 mg/dL — ABNORMAL LOW (ref 8.9–10.3)
Chloride: 111 mmol/L (ref 98–111)
Creatinine, Ser: 1.21 mg/dL — ABNORMAL HIGH (ref 0.44–1.00)
GFR, Estimated: 51 mL/min — ABNORMAL LOW (ref >60)
Glucose, Bld: 109 mg/dL — ABNORMAL HIGH (ref 70–99)
Potassium: 3.4 mmol/L — ABNORMAL LOW (ref 3.5–5.1)
Sodium: 139 mmol/L (ref 135–145)
Total Bilirubin: 0.4 mg/dL (ref 0.3–1.2)
Total Protein: 5.4 g/dL — ABNORMAL LOW (ref 6.5–8.1)

## 2021-05-15 LAB — GLUCOSE, CAPILLARY
Glucose-Capillary: 106 mg/dL — ABNORMAL HIGH (ref 70–99)
Glucose-Capillary: 115 mg/dL — ABNORMAL HIGH (ref 70–99)
Glucose-Capillary: 116 mg/dL — ABNORMAL HIGH (ref 70–99)
Glucose-Capillary: 94 mg/dL (ref 70–99)

## 2021-05-15 LAB — CBC
HCT: 26.2 % — ABNORMAL LOW (ref 36.0–46.0)
Hemoglobin: 8.5 g/dL — ABNORMAL LOW (ref 12.0–15.0)
MCH: 30.4 pg (ref 26.0–34.0)
MCHC: 32.4 g/dL (ref 30.0–36.0)
MCV: 93.6 fL (ref 80.0–100.0)
Platelets: 180 10*3/uL (ref 150–400)
RBC: 2.8 MIL/uL — ABNORMAL LOW (ref 3.87–5.11)
RDW: 14.7 % (ref 11.5–15.5)
WBC: 3.4 10*3/uL — ABNORMAL LOW (ref 4.0–10.5)
nRBC: 0 % (ref 0.0–0.2)

## 2021-05-15 LAB — PREALBUMIN: Prealbumin: 25.7 mg/dL (ref 18–38)

## 2021-05-15 LAB — PHOSPHORUS: Phosphorus: 3.3 mg/dL (ref 2.5–4.6)

## 2021-05-15 LAB — TRIGLYCERIDES: Triglycerides: 62 mg/dL (ref <150)

## 2021-05-15 MED ORDER — TRAVASOL 10 % IV SOLN
INTRAVENOUS | Status: AC
  Filled 2021-05-15: qty 480

## 2021-05-15 MED ORDER — POTASSIUM CHLORIDE 10 MEQ/50ML IV SOLN
10.0000 meq | INTRAVENOUS | Status: AC
  Administered 2021-05-15 (×4): 10 meq via INTRAVENOUS
  Filled 2021-05-15 (×4): qty 50

## 2021-05-15 MED ORDER — DEXTROSE IN LACTATED RINGERS 5 % IV SOLN: INTRAVENOUS | Status: DC

## 2021-05-15 MED ORDER — HYDRALAZINE HCL 50 MG PO TABS
100.0000 mg | ORAL_TABLET | Freq: Three times a day (TID) | ORAL | Status: DC
  Administered 2021-05-15 – 2021-05-16 (×4): 100 mg via ORAL
  Filled 2021-05-15 (×4): qty 2

## 2021-05-15 MED ORDER — MAGNESIUM SULFATE IN D5W 1-5 GM/100ML-% IV SOLN
1.0000 g | Freq: Once | INTRAVENOUS | Status: AC
  Administered 2021-05-15: 1 g via INTRAVENOUS
  Filled 2021-05-15: qty 100

## 2021-05-15 NOTE — Progress Notes (Signed)
OT Cancellation Note  Patient Details Name: Caitlin Peters MRN: [id number] DOB: July 05, 1961   Cancelled Treatment:    Reason Eval/Treat Not Completed: OT screened, no needs identified, will sign off. Patient reports she is at her baseline. Reports she Has no needs.  Treysen Sudbeck L Shari Natt 05/15/2021, 11:52 AM

## 2021-05-15 NOTE — Progress Notes (Addendum)
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   Assessment: Patient's home TPN has been on hold since admission on 6/12 due to elevated LFTs. Now with improvement in LFTs, will resumed TPN per Md orders on 6/19. Note that due to TPN being held since admission and unknown whether TPN was sole reason for LFT elevation, started TPN dosing over with continuous TPN then transition to cyclic as tolerated  Glucose / Insulin: am glucoses at goal < 150 Electrolytes: K 3.4, phos 3.3, mag 1.7 Renal: SCr up 1.21, BUN 10 Hepatic: AST/ALT down 21/107 Alb/prealbumin: alb 2.3, prealb 25.7 Intake / Output; MIVF: Net +2284.2, D5LR at 40 ml/hr GI Imaging: GI Surgeries / Procedures:   Central access: PICC line TPN start date: 6/19  Nutritional Goals (per RD recommendation on 6/19): kCal: 1800-2000, Protein: 90-105, Fluid: >= 2 L/day  Goal TPN rate is 80 mL/hr (provides 96 g of protein and 1939 kcals per day)  Current Nutrition:  Regular diet, TPN  Plan:  Potassium chloride 10 meq IV q1h x 4 Magnesium 1 g IV x 1  Continue TPN at 40 mL/hr at 1800 Electrolytes in TPN: Na 70mq/L, K increased 689m/L, Ca 51m3mL, Mg increased 8mE47m, and Phos 151mm11m. Cl:Ac 1:1 Add standard MVI and trace elements to TPN Continue Sensitive q6h SSI and adjust as needed MIVF increased to 75 ml/hr per MD Monitor TPN labs on Mon/Thurs and as needed  Keean Wilmeth P. MichaLegrand ComormD, BCPS [place] utilize Amion for appropriate phone number to reach the unit pharmacist ([facility] Ph[place]0/2022 8:28 AM

## 2021-05-15 NOTE — Plan of Care (Signed)
  Problem: Clinical Measurements: Goal: Diagnostic test results will improve Outcome: Progressing Goal: Respiratory complications will improve Outcome: Progressing   Problem: Nutrition: Goal: Adequate nutrition will be maintained Outcome: Progressing

## 2021-05-15 NOTE — Progress Notes (Signed)
PROGRESS NOTE  Caitlin Peters LNL:[id number] DOB: [birth year]   PCP: [name], [name], MD  Patient is from: Home.  Reports is using walker at baseline.  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  She was febrile to 100.6 and had leukopenia to 2.1 on presentation.  Liver ultrasound unrevealing.  Initially started on broad-spectrum antibiotics, which were discontinued by ID as there was no clear source of infection.  Remained stable.  LFT resolving.  Restarted TPN on 6/19.  Creatinine slightly up trended again.   Patient could likely go home on home TPN on 6/21 if renal function improves.  Subjective: Seen and examined earlier this morning.  No major events overnight of this morning.  Just her usual chronic abdominal pain.  Denies chest pain or dyspnea.  Creatinine slightly up.  TPN started last night.  Objective: Vitals:   05/14/21 0433 05/14/21 1341 05/14/21 2113 05/15/21 0733  BP: (!) 151/77 (!) 150/79 (!) 157/68 (!) 150/89  Pulse: 77 75 81 74  Resp: 20 20 20 20   Temp: 98.4 F (36.9 C) 99 F (37.2 C) 98.5 F (36.9 C) 98.5 F (36.9 C)  TempSrc: Oral Oral    SpO2: 99% 100% 100% 100%  Weight:      Height:        Intake/Output Summary (Last 24 hours) at 05/15/2021 1321 Last data filed at 05/15/2021 0309 Gross per 24 hour  Intake 2406.96 ml  Output 700 ml  Net 1706.96 ml   Filed Weights   05/06/21 1926  Weight: 67.2 kg    Examination:  GENERAL: Somewhat frail looking.  No apparent distress. HEENT: MMM.  Vision and hearing grossly intact.  NECK: Supple.  No apparent JVD.  RESP:  No IWOB.   Fair aeration bilaterally. CVS:  RRR. Heart sounds normal.  ABD/GI/GU: BS+. Abd soft.  Mild diffuse tenderness. MSK/EXT:  Moves extremities. No apparent deformity. No edema.  SKIN: no apparent skin lesion or wound NEURO: Awake and alert. Oriented appropriately.  No apparent focal neuro deficit. PSYCH: Calm. Normal affect.   Procedures:  None  Microbiology summarized: [id number] and influenza PCR nonreactive. Blood and urine cultures negative.  Assessment & Plan: SIRS with fever and leukopenia without clear source of infection.  Infectious work-up unrevealing.  Remains stable off antibiotics.  Sepsis ruled out.  Acute kidney injury/azotemia: Creatinine uptrending.  Not on nephrotoxic meds. Recent Labs    05/06/21 2010 05/07/21 1210 05/08/21 0500 05/08/21 1129 05/09/21 0359 05/10/21 0357 05/11/21 0403 05/12/21 0413 05/13/21 0500 05/14/21 0441 05/15/21 0451  BUN 37* 24*  --  16 14 13 9 6  <5* 5* 10  CREATININE 1.55* 1.17* 1.32* 1.23* 1.21* 1.08* 1.03* 1.01* 0.94 1.06* 1.21*  -Increase IVF to 75 cc an hour. -Recheck renal function in the morning    Elevated liver enzymes: Resolving.  Due to TPN?  RUQ [place] without significant finding. Recent Labs  Lab 05/10/21 1828 05/11/21 0403 05/13/21 0500 05/14/21 0441 05/15/21 0451  AST 62* 48* 35 26 21  ALT 398* 346* 206* 146* 107*  ALKPHOS 211* 200* 189* 170* 165*  BILITOT 0.7 0.5 0.5 0.5 0.4  PROT 5.6*  5.5* 5.8* 5.4* 5.4*  ALBUMIN 2.2* 2.3* 2.5* 2.4* 2.3*  -On continuous TPN with a plan to transition to cyclic TPN -Trend LFT   History of Crohn's Short gut syndrome//pancreatic insufficiency/chronic diarrhea Chronic abdominal pain -Continue Imodium and antimotility agents -Resume home Gattex if patient can bring from home -Continue home Creon -Increase IV fluid to 75 cc an hour. -Continue TPN per pharmacy.  Normocytic anemia/anemia of chronic disease: stable.  Has low ferritin. Recent Labs    04/14/21 0608 05/06/21 2010  05/07/21 1210 05/08/21 1129 05/09/21 0359 05/10/21 0357 05/11/21 0403 05/13/21 0500 05/14/21 0441 05/15/21 0451  HGB 8.9* 9.3* 8.7* 9.4* 9.0* 9.3* 9.1* 9.7* 9.3* 8.5*  -Monitor H&H  Leukopenia: Resolved.   Hypokalemia/hypomagnesemia/hypophosphatemia:  -Pharmacy replacing with TPN as appropriate   Right lower extremity edema: LE [place] negative for DVT.  History of seizure?  Stable. -Continue home Keppra   Essential hypertension: BP slightly elevated -On maximum dose of amlodipine and Coreg. -Increase hydralazine 200 mg 3 times daily -Not a candidate for diuretics.   GERD -Continue PPI   Anxiety and depression: Stable. -Continue Cymbalta and bupropion  Generalized weakness/debility: Reports using walker at baseline. -OOB/PT/OT  Body mass index is 22.53 kg/m. Nutrition Problem: Inadequate oral intake Etiology: altered GI function Signs/Symptoms: other (comment) (need for home TPN) Interventions: TPN   DVT prophylaxis:  enoxaparin (LOVENOX) injection 40 mg Start: 05/07/21 1000 SCDs Start: 05/06/21 2347  Code Status: Full code Family Communication: Patient and/or RN. Available if any question.  Level of care: Progressive.  Transition to MedSurg. Status is: Inpatient  Remains inpatient appropriate because:Persistent severe electrolyte disturbances, IV treatments appropriate due to intensity of illness or inability to take PO, and Inpatient level of care appropriate due to severity of illness  Dispo: The patient is from: Home              Anticipated d/c is to: Home              Patient currently is not medically stable to d/c.   Difficult to place patient No       Consultants:  Infectious disease-signed off   Sch Meds:  Scheduled Meds:  amLODipine  10 mg Oral Daily   budesonide  3 mg Oral QODAY   buPROPion  200 mg Oral Daily   carvedilol  25 mg Oral BID WC   Chlorhexidine Gluconate Cloth  6 each Topical Daily   cycloSPORINE  1 drop Both Eyes BID    DULoxetine  90 mg Oral Daily   enoxaparin (LOVENOX) injection  40 mg Subcutaneous Q24H   hydrALAZINE  75 mg Oral TID   lactobacillus   Oral Daily   levETIRAcetam  500 mg Oral BID   pantoprazole  40 mg Oral Daily   sodium chloride flush  10-40 mL Intracatheter Q12H   sucralfate  1 g Oral TID WC & HS   Continuous Infusions:  sodium chloride 10 mL/hr at 05/14/21 1156   dextrose 5% lactated ringers     magnesium sulfate bolus IVPB 1 g (05/15/21 1256)   potassium chloride 10 mEq (05/15/21 1247)   promethazine (PHENERGAN) injection (IM or IVPB) 12.5 mg (05/15/21 1200)   TPN ADULT (ION) 40 mL/hr at 05/14/21 1827   TPN ADULT (ION)     PRN Meds:.sodium chloride, acetaminophen **OR** acetaminophen, diphenoxylate-atropine, famotidine, HYDROcodone-acetaminophen, HYDROmorphone (DILAUDID) injection, lipase/protease/amylase, loperamide, naloxone, ondansetron **OR** ondansetron (ZOFRAN) IV, promethazine (PHENERGAN) injection (IM or IVPB), sodium chloride flush  Antimicrobials: Anti-infectives (From admission, onward)  Start     Dose/Rate Route Frequency Ordered Stop   05/08/21 0800  vancomycin (VANCOREADY) IVPB 1250 mg/250 mL  Status:  Discontinued        1,250 mg 166.7 mL/hr over 90 Minutes Intravenous Every 36 hours 05/06/21 2321 05/07/21 1358   05/07/21 2200  vancomycin (VANCOREADY) IVPB 1250 mg/250 mL  Status:  Discontinued        1,250 mg 166.7 mL/hr over 90 Minutes Intravenous Every 24 hours 05/07/21 1358 05/09/21 1744   05/07/21 1000  ceFEPIme (MAXIPIME) 2 g in sodium chloride 0.9 % 100 mL IVPB  Status:  Discontinued        2 g 200 mL/hr over 30 Minutes Intravenous Every 12 hours 05/06/21 2321 05/10/21 1417   05/07/21 0500  metroNIDAZOLE (FLAGYL) IVPB 500 mg  Status:  Discontinued        500 mg 100 mL/hr over 60 Minutes Intravenous Every 8 hours 05/06/21 2346 05/10/21 1417   05/06/21 2000  ceFEPIme (MAXIPIME) 2 g in sodium chloride 0.9 % 100 mL IVPB        2 g 200 mL/hr over 30 Minutes  Intravenous  Once 05/06/21 1949 05/06/21 2216   05/06/21 2000  metroNIDAZOLE (FLAGYL) IVPB 500 mg        500 mg 100 mL/hr over 60 Minutes Intravenous  Once 05/06/21 1949 05/06/21 2124   05/06/21 2000  vancomycin (VANCOCIN) IVPB 1000 mg/200 mL premix  Status:  Discontinued        1,000 mg 200 mL/hr over 60 Minutes Intravenous  Once 05/06/21 1949 05/06/21 1953   05/06/21 2000  vancomycin (VANCOCIN) 1,500 mg in sodium chloride 0.9 % 500 mL IVPB        1,500 mg 250 mL/hr over 120 Minutes Intravenous  Once 05/06/21 1953 05/06/21 2249        I have personally reviewed the following labs and images: CBC: Recent Labs  Lab 05/10/21 0357 05/11/21 0403 05/13/21 0500 05/14/21 0441 05/15/21 0451  WBC 3.4* 3.0* 4.4 3.9* 3.4*  NEUTROABS  --   --  1.8 1.7 1.1*  HGB 9.3* 9.1* 9.7* 9.3* 8.5*  HCT 28.1* 27.7* 28.7* 28.2* 26.2*  MCV 93.4 91.4 91.4 92.2 93.6  PLT 160 164 202 198 180   BMP &GFR Recent Labs  Lab 05/10/21 0357 05/11/21 0403 05/12/21 0413 05/13/21 0500 05/14/21 0441 05/15/21 0451  NA 136 137 140 138 137 139  K 3.2* 3.0* 3.0* 4.1 3.5 3.4*  CL 112* 111 114* 112* 111 111  CO2 18* 21* 24 23 23 24   GLUCOSE 78 106* 101* 108* 106* 109*  BUN 13 9 6  <5* 5* 10  CREATININE 1.08* 1.03* 1.01* 0.94 1.06* 1.21*  CALCIUM 7.3* 7.0* 7.3* 7.9* 7.6* 7.8*  MG 1.5* 1.8 1.6* 1.6* 1.7 1.7  PHOS 1.4* 1.8*  --  1.1* 2.3* 3.3   Estimated Creatinine Clearance: 49.9 mL/min (A) (by C-G formula based on SCr of 1.21 mg/dL (H)). Liver & Pancreas: Recent Labs  Lab 05/10/21 1828 05/11/21 0403 05/13/21 0500 05/14/21 0441 05/15/21 0451  AST 62* 48* 35 26 21  ALT 398* 346* 206* 146* 107*  ALKPHOS 211* 200* 189* 170* 165*  BILITOT 0.7 0.5 0.5 0.5 0.4  PROT 5.6* 5.5* 5.8* 5.4* 5.4*  ALBUMIN 2.2* 2.3* 2.5* 2.4* 2.3*   Recent Labs  Lab 05/13/21 0500  LIPASE 31   No results for input(s): AMMONIA in the last 168 hours. Diabetic: No results for input(s): HGBA1C in the last 72 hours. Recent Labs  Lab 05/15/21 0003 05/15/21 0801 05/15/21 1133  GLUCAP 116* 94 106*   Cardiac Enzymes: No results for input(s): CKTOTAL, CKMB, CKMBINDEX, TROPONINI in the last 168 hours. No results for input(s): PROBNP in the last 8760 hours. Coagulation Profile: No results for input(s): INR, PROTIME in the last 168 hours.  Thyroid Function Tests: No results for input(s): TSH, T4TOTAL, FREET4, T3FREE, THYROIDAB in the last 72 hours. Lipid Profile: Recent Labs    05/14/21 0441 05/15/21 0451  TRIG 63 62   Anemia Panel: Recent Labs    05/13/21 0500  VITAMINB12 3,085*  FOLATE 22.4  FERRITIN 16  TIBC 278  IRON 60  RETICCTPCT 1.3   Urine analysis:    Component Value Date/Time   COLORURINE YELLOW 05/06/2021 2026   APPEARANCEUR CLEAR 05/06/2021 2026   LABSPEC 1.014 05/06/2021 2026   PHURINE 8.0 05/06/2021 2026   GLUCOSEU NEGATIVE 05/06/2021 2026   HGBUR NEGATIVE 05/06/2021 2026   BILIRUBINUR NEGATIVE 05/06/2021 2026   KETONESUR NEGATIVE 05/06/2021 2026   PROTEINUR >=300 (A) 05/06/2021 2026   NITRITE NEGATIVE 05/06/2021 2026   LEUKOCYTESUR NEGATIVE 05/06/2021 2026   Sepsis Labs: Invalid input(s): PROCALCITONIN, [place]  Microbiology: Recent Results (from the past 240 hour(s))  Resp Panel by RT-PCR (Flu A&B, Covid) Nasopharyngeal Swab     Status: None   Collection Time: 05/06/21  8:10 PM   Specimen: Nasopharyngeal Swab; Nasopharyngeal(NP) swabs in vial transport medium  Result Value Ref Range Status   SARS Coronavirus 2 by RT PCR NEGATIVE NEGATIVE Final    Comment: (NOTE) SARS-CoV-2 target nucleic acids are NOT DETECTED.  The SARS-CoV-2 RNA is generally detectable in upper respiratory specimens during the acute phase of infection. The lowest concentration of SARS-CoV-2 viral copies this assay can detect is 138 copies/mL. A negative result does not preclude SARS-Cov-2 infection and should not be used as the sole basis for treatment or other patient management decisions. A  negative result may occur with  improper specimen collection/handling, submission of specimen other than nasopharyngeal swab, presence of viral mutation(s) within the areas targeted by this assay, and inadequate number of viral copies(<138 copies/mL). A negative result must be combined with clinical observations, patient history, and epidemiological information. The expected result is Negative.  Fact Sheet for Patients:  EntrepreneurPulse.com.au  Fact Sheet for Healthcare Providers:  IncredibleEmployment.be  This test is no t yet approved or cleared by the [place] FDA and  has been authorized for detection and/or diagnosis of SARS-CoV-2 by FDA under an Emergency Use Authorization (EUA). This EUA will remain  in effect (meaning this test can be used) for the duration of the COVID-19 declaration under Section 564(b)(1) of the Act, 21 U.S.C.section 360bbb-3(b)(1), unless the authorization is terminated  or revoked sooner.       Influenza A by PCR NEGATIVE NEGATIVE Final   Influenza B by PCR NEGATIVE NEGATIVE Final    Comment: (NOTE) The Xpert Xpress SARS-CoV-2/FLU/RSV plus assay is intended as an aid in the diagnosis of influenza from Nasopharyngeal swab specimens and should not be used as a sole basis for treatment. Nasal washings and aspirates are unacceptable for Xpert Xpress SARS-CoV-2/FLU/RSV testing.  Fact Sheet for Patients: EntrepreneurPulse.com.au  Fact Sheet for Healthcare Providers: IncredibleEmployment.be  This test is not yet approved or cleared by the [place] FDA and has been authorized for detection and/or diagnosis of SARS-CoV-2 by FDA under an Emergency Use Authorization (EUA). This EUA will remain in effect (meaning this test can be used) for the duration of the  COVID-19 declaration under Section 564(b)(1) of the Act, 21 U.S.C. section 360bbb-3(b)(1), unless the authorization  is terminated or revoked.  Performed at [facility], [place] [address], [place], [place] [address]   Blood Culture (routine x 2)     Status: None   Collection Time: 05/06/21  8:25 PM   Specimen: BLOOD  Result Value Ref Range Status   Specimen Description   Final    BLOOD PICC LINE FEMORAL ARTERY Performed at [facility] [address], [place], [place] [address]    Special Requests   Final    BOTTLES DRAWN AEROBIC AND ANAEROBIC Blood Culture adequate volume Performed at [place] [address], [place], [place] [address]    Culture   Final    NO GROWTH 5 DAYS Performed at [facility], [place] [address], [place], [place] [address]    Report Status 05/12/2021 FINAL  Final  Blood Culture (routine x 2)     Status: None   Collection Time: 05/06/21  8:26 PM   Specimen: BLOOD LEFT ARM  Result Value Ref Range Status   Specimen Description   Final    BLOOD LEFT ARM Performed at [facility], [place] [address], [place], [place] [address]    Special Requests   Final    BOTTLES DRAWN AEROBIC AND ANAEROBIC Blood Culture results may not be optimal due to an inadequate volume of blood received in culture bottles Performed at [place] [address] [place], [place] [address]    Culture   Final    NO GROWTH 5 DAYS Performed at [facility], [place] [address] [place], [place] [address]    Report Status 05/12/2021 FINAL  Final  Urine culture     Status: None   Collection Time: 05/06/21  8:26 PM   Specimen: In/Out Cath Urine  Result Value Ref Range Status   Specimen Description   Final    IN/OUT CATH URINE Performed at [place] [address] [place], [place] [address]    Special Requests   Final    NONE Performed at [facility], [place] [address], [place], [place] [address]    Culture   Final    NO GROWTH Performed at [facility],  [place] [address], [place], [place] [address]    Report Status 05/08/2021 FINAL  Final    Radiology Studies: No results found.     Douglas Rooks T. [place]  If 7PM-7AM, please contact night-coverage [email or web address] 05/15/2021, 1:21 PM

## 2021-05-15 NOTE — Care Management Important Message (Signed)
Important Message  Patient Details IM Letter given to the Patient. Name: Caitlin Peters MRN: [id number] Date of Birth: 1961-10-27   Medicare Important Message Given:  Yes     [name] 05/15/2021, 1:03 PM

## 2021-05-16 ENCOUNTER — Ambulatory Visit: Payer: Medicare Other

## 2021-05-16 DIAGNOSIS — R6520 Severe sepsis without septic shock: Secondary | ICD-10-CM | POA: Diagnosis not present

## 2021-05-16 DIAGNOSIS — R509 Fever, unspecified: Secondary | ICD-10-CM | POA: Diagnosis not present

## 2021-05-16 DIAGNOSIS — K7200 Acute and subacute hepatic failure without coma: Secondary | ICD-10-CM | POA: Diagnosis not present

## 2021-05-16 DIAGNOSIS — N179 Acute kidney failure, unspecified: Secondary | ICD-10-CM | POA: Diagnosis not present

## 2021-05-16 DIAGNOSIS — A419 Sepsis, unspecified organism: Secondary | ICD-10-CM | POA: Diagnosis not present

## 2021-05-16 LAB — BASIC METABOLIC PANEL
Anion gap: 3 — ABNORMAL LOW (ref 5–15)
BUN: 15 mg/dL (ref 6–20)
CO2: 25 mmol/L (ref 22–32)
Calcium: 8.2 mg/dL — ABNORMAL LOW (ref 8.9–10.3)
Chloride: 109 mmol/L (ref 98–111)
Creatinine, Ser: 0.99 mg/dL (ref 0.44–1.00)
GFR, Estimated: >60 mL/min (ref >60)
Glucose, Bld: 120 mg/dL — ABNORMAL HIGH (ref 70–99)
Potassium: 3.7 mmol/L (ref 3.5–5.1)
Sodium: 137 mmol/L (ref 135–145)

## 2021-05-16 LAB — MAGNESIUM: Magnesium: 1.9 mg/dL (ref 1.7–2.4)

## 2021-05-16 LAB — GLUCOSE, CAPILLARY
Glucose-Capillary: 108 mg/dL — ABNORMAL HIGH (ref 70–99)
Glucose-Capillary: 111 mg/dL — ABNORMAL HIGH (ref 70–99)
Glucose-Capillary: 121 mg/dL — ABNORMAL HIGH (ref 70–99)

## 2021-05-16 LAB — PHOSPHORUS: Phosphorus: 2.8 mg/dL (ref 2.5–4.6)

## 2021-05-16 MED ORDER — HYDRALAZINE HCL 100 MG PO TABS: 100.0000 mg | ORAL_TABLET | Freq: Three times a day (TID) | ORAL | 0 refills | Status: DC

## 2021-05-16 MED ORDER — HEPARIN SOD (PORK) LOCK FLUSH 100 UNIT/ML IV SOLN
250.0000 [IU] | INTRAVENOUS | Status: AC | PRN
  Administered 2021-05-16: 250 [IU]

## 2021-05-16 MED ORDER — CARVEDILOL 25 MG PO TABS: 25.0000 mg | ORAL_TABLET | Freq: Two times a day (BID) | ORAL | 0 refills | Status: DC

## 2021-05-16 NOTE — Discharge Summary (Signed)
Physician Discharge Summary  LIANETTE BROUSSARD YIF:[id number] DOB: July 28, 1961 DOA: 05/06/2021  PCP: [name], [name], MD  Admit date: 05/06/2021 Discharge date: 05/16/2021 30 Day Unplanned Readmission Risk Score    Flowsheet Row ED to Hosp-Admission (Current) from 05/06/2021 in [place]  30 Day Unplanned Readmission Risk Score (%) 82.97 Filed at 05/16/2021 0801       This score is the patient's risk of an unplanned readmission within 30 days of being discharged (0 -100%). The score is based on dignosis, age, lab data, medications, orders, and past utilization.   Low:  0-14.9   Medium: 15-21.9   High: 22-29.9   Extreme: 30 and above           Admitted From: Home  Disposition: Home  Recommendations for Outpatient Follow-up:  Follow up with PCP in 1-2 weeks Please obtain BMP/CBC in one week Please follow up with your PCP on the following pending results: Unresulted Labs (From admission, onward)     Start     Ordered   05/15/21 0500  Comprehensive metabolic panel  ([facility] Lab Panel)  Every Mon,Thu (0500),   R     Question:  Specimen collection method  Answer:  IV Team=IV Team collect   05/13/21 1253   05/15/21 0500  Magnesium  ([facility] Lab Panel)  Every Mon,Thu (0500),   R     Question:  Specimen collection method  Answer:  IV Team=IV Team collect   05/13/21 1253   05/15/21 0500  Phosphorus  ([facility] Lab Panel)  Every Mon,Thu (0500),   R     Question:  Specimen collection method  Answer:  IV Team=IV Team collect   05/13/21 1253   05/15/21 0500  CBC  ([facility] Lab Panel)  Every Monday (0500),   R     Question:  Specimen collection method  Answer:  IV Team=IV Team collect   05/13/21 1253   05/15/21 0500  Differential  ([facility] Lab Panel)  Every Monday (0500),   R     Question:  Specimen collection method  Answer:  IV Team=IV Team collect   05/13/21 1253   05/15/21 0500  Triglycerides  ([facility] Lab Panel)  Every Monday (0500),   R     Question:   Specimen collection method  Answer:  IV Team=IV Team collect   05/13/21 1253   05/15/21 0500  Prealbumin  ([facility] Lab Panel)  Every Monday (0500),   R     Question:  Specimen collection method  Answer:  IV Team=IV Team collect   05/13/21 [place]: None Equipment/Devices: None  Discharge Condition: Stable CODE STATUS: Full code Diet recommendation: Cardiac  Subjective: Seen and examined.  No new complaint.  Chronic abdominal pain and chronic diarrhea.  Otherwise feeling better.  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  She was febrile to 100.6 and had leukopenia to 2.1 on presentation.  Liver ultrasound unrevealing.  Initially started on broad-spectrum antibiotics, which were discontinued by ID as there was no clear source of infection.  Remained stable.  LFT resolving.  Restarted TPN on 6/19.  Had some jump in creatinine yesterday which improved today.  Elevated liver enzymes: Resolving.  Due to TPN?  RUQ [place] without significant finding.   History of Crohn's Short gut syndrome//pancreatic insufficiency/chronic diarrhea Chronic abdominal pain -Continue Imodium and antimotility agents -Resume home Gattex if patient can bring from home -Continue home Creon and TPN.   Normocytic anemia/anemia of chronic disease: stable.     Leukopenia: Resolved.   Hypokalemia/hypomagnesemia/hypophosphatemia: -Pharmacy replacing with TPN as appropriate   Right lower extremity edema: LE [place] negative for DVT.   History of seizure?  Stable. -Continue home [place]   Essential hypertension: BP slightly elevated -She was already on maximum dose of amlodipine.  Her metoprolol was switched to Coreg which was also maximized to 25  mg twice daily and then her hydralazine was also increased from 75 mg 3 times daily to 100 mg 3 times daily and her blood pressure is fairly controlled today.   GERD -Continue PPI   Anxiety and depression: Stable. -Continue Cymbalta and bupropion   Generalized weakness/debility: Seen by PT OT, they did not recommend any further PT OT needs.  Discharge Diagnoses:  Principal Problem:   Sepsis ([place]) Active Problems:   Crohn's disease of small & large intestine with SGS   HTN (hypertension), benign   GERD (gastroesophageal reflux disease)   Acute kidney injury superimposed on chronic kidney disease ([name])   LFTs abnormal   Fever of unknown origin   Elevated LFTs    Discharge Instructions   Allergies as of 05/16/2021       Reactions   Meperidine Hives   Other reaction(s): GI Upset Due to Chrones   Hyoscyamine Hives, Swelling   Legs swelling Disorientation   Cefepime Other (See Comments)   Neurotoxicity occurring in setting of AKI. Ceftriaxone tolerated during same admit   Gabapentin Other (See Comments)   unknown   Lyrica [pregabalin] Other (See Comments)   unknown   Topamax [topiramate] Other (See Comments)   unknown   Zosyn [piperacillin Sod-tazobactam So]    Patient reports it makes her vomit, her neck stiff, and her "heart feel funny"   Fentanyl Rash   Pt is allergic to fentanyl patch related to the glue (gives her a rash) Pt states she is NOT allergic to fentanyl IV medicine   Morphine And Related Rash        Medication List     STOP taking these medications    metoprolol tartrate 100 MG tablet Commonly known as: LOPRESSOR       TAKE these medications    acetaminophen 325 MG tablet Commonly known as: TYLENOL Take 650 mg by mouth every 6 (six) hours as needed for mild pain.   amLODipine 10 MG tablet Commonly known as: NORVASC Take 10 mg by mouth daily.   budesonide 3 MG 24 hr capsule Commonly known as: ENTOCORT EC Take 3 capsules (9 mg total)  by mouth daily. What changed:  how much to take when to take this   buPROPion 100 MG 12 hr tablet Commonly known as: WELLBUTRIN SR Take 200 mg by mouth daily.   Calcium 200 MG Tabs Take 200 mg by mouth daily.   carvedilol 25 MG tablet Commonly known as: COREG Take 1 tablet (25 mg total) by mouth 2 (two) times daily with a meal.   cholecalciferol 25 MCG (1000 UNIT) tablet Commonly known as: VITAMIN D3 Take 1,000 Units by mouth daily.   cycloSPORINE 0.05 % ophthalmic emulsion Commonly known as: RESTASIS Place 1 drop into both eyes 2 (two) times daily.   denosumab 60 MG/ML Sosy injection Commonly  known as: PROLIA Inject 60 mg into the skin every 6 (six) months.   dexlansoprazole 60 MG capsule Commonly known as: Dexilant Take 1 capsule (60 mg total) by mouth daily.   Dextran 70-Hypromellose 0.1-0.3 % Soln Place 1 drop into both eyes 4 (four) times daily.   diphenoxylate-atropine 2.5-0.025 MG tablet Commonly known as: LOMOTIL TAKE 1 TABLET BY MOUTH 4 TIMES DAILY AS NEEDED FOR DIARRHEA OR  LOOSE  STOOLS What changed:  how much to take how to take this when to take this reasons to take this additional instructions   DULoxetine 30 MG capsule Commonly known as: CYMBALTA TAKE 3 CAPSULES BY MOUTH ONCE DAILY   estradiol 2 MG tablet Commonly known as: ESTRACE Take 1 tablet (2 mg total) by mouth daily.   famotidine 20 MG tablet Commonly known as: PEPCID Take 20 mg by mouth daily as needed for heartburn or indigestion.   Gattex 5 MG Kit Generic drug: Teduglutide (rDNA) Inject 5 mLs into the skin daily.   hydrALAZINE 100 MG tablet Commonly known as: APRESOLINE Take 1 tablet (100 mg total) by mouth 3 (three) times daily. What changed:  medication strength how much to take   HYDROmorphone 4 MG tablet Commonly known as: DILAUDID Take 4 mg by mouth in the morning, at noon, in the evening, and at bedtime.   levETIRAcetam 500 MG tablet Commonly known as:  KEPPRA Take 500 mg by mouth 2 (two) times daily.   lipase/protease/amylase 36000 UNITS Cpep capsule Commonly known as: Creon Take 1 capsule (36,000 Units total) by mouth 3 (three) times daily as needed (with meals for digestion).   loperamide 2 MG capsule Commonly known as: IMODIUM TAKE 1 CAPSULE BY MOUTH AS NEEDED FOR DIARRHEA OR LOOSE STOOLS What changed:  how much to take how to take this when to take this reasons to take this additional instructions   methadone 5 MG tablet Commonly known as: DOLOPHINE Take 1 tablet (5 mg total) by mouth 5 (five) times daily. What changed:  when to take this reasons to take this   MULTIVITAMIN ADULT PO Take 1 tablet by mouth daily.   Narcan 4 MG/0.1ML Liqd nasal spray kit Generic drug: naloxone Place 1 spray into the nose once as needed (overdose).   PRESCRIPTION MEDICATION Inject 1 each into the vein daily. Home TPN . [facility] . 1 bag for 12 hours. [phone]   PROBIOTIC-10 PO Take 1 capsule by mouth daily.   promethazine 25 MG tablet Commonly known as: PHENERGAN TAKE 1 TABLET BY MOUTH EVERY 6 HOURS AS NEEDED FOR NAUSEA FOR VOMITING   sodium chloride 0.9 % infusion Inject 1 mL into the vein daily as needed (flush).   sucralfate 1 GM/10ML suspension Commonly known as: Carafate Take 10 mLs (1 g total) by mouth 4 (four) times daily -  with meals and at bedtime.   TRALEMENT IV Inject 1 mL into the vein See admin instructions. Used in TPN bag 4 times weekly   vitamin B-12 100 MCG tablet Commonly known as: CYANOCOBALAMIN Take 1,000 mcg by mouth daily.        Follow-up Information     [name], [name], MD Follow up in 1 week(s).   Specialty: Internal Medicine Contact information: [place] [address] [phone]                Allergies  Allergen Reactions   Meperidine Hives    Other reaction(s): GI Upset  Due to Chrones    Hyoscyamine  Hives and Swelling    Legs swelling   Disorientation   Cefepime Other (See Comments)    Neurotoxicity occurring in setting of AKI. Ceftriaxone tolerated during same admit   Gabapentin Other (See Comments)    unknown   Lyrica [Pregabalin] Other (See Comments)    unknown   Topamax [Topiramate] Other (See Comments)    unknown   Zosyn [Piperacillin Sod-Tazobactam So]     Patient reports it makes her vomit, her neck stiff, and her "heart feel funny"   Fentanyl Rash    Pt is allergic to fentanyl patch related to the glue (gives her a rash) Pt states she is NOT allergic to fentanyl IV medicine   Morphine And Related Rash    Consultations: ID   Procedures/Studies: MR THORACIC SPINE W WO CONTRAST  Result Date: 05/09/2021 CLINICAL DATA:  Fever and possible discitis EXAM: MRI THORACIC WITHOUT AND WITH CONTRAST TECHNIQUE: Multiplanar and multiecho pulse sequences of the thoracic spine were obtained without and with intravenous contrast. CONTRAST:  6.6m GADAVIST GADOBUTROL 1 MMOL/ML IV SOLN COMPARISON:  02/10/2021 FINDINGS: Alignment:  Physiologic. Vertebrae: Abnormal contrast enhancement and hyperintense T2-weighted signal at the T3-4 level has decreased since the prior study. Prevertebral and epidural abnormalities have also decreased. Cord:  Normal signal and morphology. Paraspinal and other soft tissues: Small, left-greater-than-right pleural effusions. Disc levels: No spinal canal or neural foraminal stenosis. IMPRESSION: 1. Decreased findings of discitis-osteomyelitis at the T3-4 level. 2. No spinal canal or neural foraminal stenosis. 3. Small, left-greater-than-right pleural effusions. Electronically Signed   By: KUlyses JarredM.D.   On: 05/09/2021 00:44   MR Lumbar Spine W Wo Contrast  Result Date: 05/09/2021 CLINICAL DATA:  Fever with history of discitis EXAM: MRI LUMBAR SPINE WITHOUT AND WITH CONTRAST TECHNIQUE: Multiplanar and multiecho pulse sequences of the lumbar spine were obtained  without and with intravenous contrast. CONTRAST:  6.598mGADAVIST GADOBUTROL 1 MMOL/ML IV SOLN COMPARISON:  None. FINDINGS: Segmentation:  Standard. Alignment:  Physiologic. Vertebrae:  No fracture, evidence of discitis, or bone lesion. Conus medullaris and cauda equina: Conus extends to the L1 level. Conus and cauda equina appear normal. Paraspinal and other soft tissues: Negative. Disc levels: No spinal canal or neural foraminal stenosis. IMPRESSION: Normal lumbar spine MRI. No discitis/osteomyelitis or epidural abscess at the lumbar levels. Electronically Signed   By: KeUlyses Jarred.D.   On: 05/09/2021 00:54   DG Chest Port 1 View  Result Date: 05/06/2021 CLINICAL DATA:  Questionable sepsis EXAM: PORTABLE CHEST 1 VIEW COMPARISON:  February 09, 2021 FINDINGS: The heart size borderline. The hila and mediastinum are unremarkable. No pneumothorax. No nodules or masses. No focal infiltrates. IMPRESSION: No acute abnormalities.  No cause for sepsis identified. Electronically Signed   By: DaDorise BullionII M.D   On: 05/06/2021 21:18   VAS USK[place]OWER EXTREMITY VENOUS (DVT)  Result Date: 05/08/2021  Lower Venous DVT Study Patient Name:  MATIKISHA MOLINARODate of Exam:   05/07/2021 Medical Rec #: [id number]       Accession #:    [id number] of Birth: 4/11-02-1961       Patient Gender: F Patient Age:   60Y [facility]      [facility] US[place]OWER EXTREMITY VENOUS (DVT) Referring Phys: [id number] B ZIShepherds[place]-------------------------------------------------------------------------------  Indications: PT reports intermittent swelling RT foot. Has PICC RT thigh.  Limitations: Bandaging around PICC. Comparison  Study: 11-08-2020 Prior RT lower extremity venous study was negative                   for DVT. Performing Technologist: Darlin Coco RDMS,RVT  Examination Guidelines: A complete evaluation includes B-mode imaging, spectral Doppler, color Doppler, and power Doppler as needed of all  accessible portions of each vessel. Bilateral testing is considered an integral part of a complete examination. Limited examinations for reoccurring indications may be performed as noted. The reflux portion of the exam is performed with the patient in reverse Trendelenburg.  +---------+---------------+---------+-----------+----------+--------------+ RIGHT    CompressibilityPhasicitySpontaneityPropertiesThrombus Aging +---------+---------------+---------+-----------+----------+--------------+ CFV      Full           Yes      Yes                                 +---------+---------------+---------+-----------+----------+--------------+ SFJ      Full                                                        +---------+---------------+---------+-----------+----------+--------------+ FV Prox  Full                                                        +---------+---------------+---------+-----------+----------+--------------+ FV Mid   Full                                                        +---------+---------------+---------+-----------+----------+--------------+ FV DistalFull                                                        +---------+---------------+---------+-----------+----------+--------------+ PFV      Full                                                        +---------+---------------+---------+-----------+----------+--------------+ POP      Full           Yes      Yes                                 +---------+---------------+---------+-----------+----------+--------------+ PTV      Full                                                        +---------+---------------+---------+-----------+----------+--------------+ PERO     Full                                                        +---------+---------------+---------+-----------+----------+--------------+  Gastroc  Full                                                         +---------+---------------+---------+-----------+----------+--------------+   +----+---------------+---------+-----------+----------+--------------+ LEFTCompressibilityPhasicitySpontaneityPropertiesThrombus Aging +----+---------------+---------+-----------+----------+--------------+ CFV Full           Yes      Yes                                 +----+---------------+---------+-----------+----------+--------------+    Summary: RIGHT: - There is no evidence of deep vein thrombosis in the lower extremity.  - No cystic structure found in the popliteal fossa. - Limited visualization of PICC at proximal, lateral thigh secondary to bandaging.  LEFT: - No evidence of common femoral vein obstruction.  *See table(s) above for measurements and observations. Electronically signed by Jamelle Haring on 05/08/2021 at 10:51:59 AM.    Final    US ABDOMEN LIMITED RUQ (LIVER/GB)  Result Date: 05/10/2021 CLINICAL DATA:  Elevated LFTs EXAM: ULTRASOUND ABDOMEN LIMITED RIGHT UPPER QUADRANT COMPARISON:  04/07/2021 FINDINGS: Gallbladder: Surgically removed Common bile duct: Diameter: 6.2 mm. Liver: No focal lesion identified. Within normal limits in parenchymal echogenicity. Portal vein is patent on color Doppler imaging with normal direction of blood flow towards the liver. Other: Multiple fluid-filled loops of bowel are noted similar to that seen on recent CT examination. IMPRESSION: Status post cholecystectomy. No acute abnormality noted. Electronically Signed   By: Inez Catalina M.D.   On: 05/10/2021 13:14     Discharge Exam: Vitals:   05/15/21 2314 05/16/21 0359  BP: (!) 161/69 (!) 165/63  Pulse: 81 80  Resp: 18 16  Temp: 98.1 F (36.7 C) 98.2 F (36.8 C)  SpO2: 99% 99%   Vitals:   05/14/21 2113 05/15/21 0733 05/15/21 2314 05/16/21 0359  BP: (!) 157/68 (!) 150/89 (!) 161/69 (!) 165/63  Pulse: 81 74 81 80  Resp: _0 Temp: 98.5 F (36.9 C) 98.5 F (36.9 C) 98.1 F (36.7 C) 98.2 F (36.8 C)   TempSrc:      SpO2: 100% 100% 99% 99%  Weight:      Height:        General: Pt is alert, awake, not in acute distress, chronically sick looking 90 Cardiovascular: RRR, S1/S2 +, no rubs, no gallops Respiratory: CTA bilaterally, no wheezing, no rhonchi Abdominal: Soft, mild left lower quadrant tenderness which is,, ND, bowel sounds + Extremities: no edema, no cyanosis    The results of significant diagnostics from this hospitalization (including imaging, microbiology, ancillary and laboratory) are listed below for reference.     Microbiology: Recent Results (from the past 240 hour(s))  Resp Panel by RT-PCR (Flu A&B, Covid) Nasopharyngeal Swab     Status: None   Collection Time: 05/06/21  8:10 PM   Specimen: Nasopharyngeal Swab; Nasopharyngeal(NP) swabs in vial transport medium  Result Value Ref Range Status   SARS Coronavirus 2 by RT PCR NEGATIVE NEGATIVE Final    Comment: (NOTE) SARS-CoV-2 target nucleic acids are NOT DETECTED.  The SARS-CoV-2 RNA is generally detectable in upper respiratory specimens during the acute phase of infection. The lowest concentration of SARS-CoV-2 viral copies this assay can detect is 138 copies/mL. A negative result does not preclude SARS-Cov-2 infection and should  not be used as the sole basis for treatment or other patient management decisions. A negative result may occur with  improper specimen collection/handling, submission of specimen other than nasopharyngeal swab, presence of viral mutation(s) within the areas targeted by this assay, and inadequate number of viral copies(<138 copies/mL). A negative result must be combined with clinical observations, patient history, and epidemiological information. The expected result is Negative.  Fact Sheet for Patients:  EntrepreneurPulse.com.au  Fact Sheet for Healthcare Providers:  IncredibleEmployment.be  This test is no t yet approved or cleared by the  [place] FDA and  has been authorized for detection and/or diagnosis of SARS-CoV-2 by FDA under an Emergency Use Authorization (EUA). This EUA will remain  in effect (meaning this test can be used) for the duration of the COVID-19 declaration under Section 564(b)(1) of the Act, 21 U.S.C.section 360bbb-3(b)(1), unless the authorization is terminated  or revoked sooner.       Influenza A by PCR NEGATIVE NEGATIVE Final   Influenza B by PCR NEGATIVE NEGATIVE Final    Comment: (NOTE) The Xpert Xpress SARS-CoV-2/FLU/RSV plus assay is intended as an aid in the diagnosis of influenza from Nasopharyngeal swab specimens and should not be used as a sole basis for treatment. Nasal washings and aspirates are unacceptable for Xpert Xpress SARS-CoV-2/FLU/RSV testing.  Fact Sheet for Patients: EntrepreneurPulse.com.au  Fact Sheet for Healthcare Providers: IncredibleEmployment.be  This test is not yet approved or cleared by the [place] FDA and has been authorized for detection and/or diagnosis of SARS-CoV-2 by FDA under an Emergency Use Authorization (EUA). This EUA will remain in effect (meaning this test can be used) for the duration of the COVID-19 declaration under Section 564(b)(1) of the Act, 21 U.S.C. section 360bbb-3(b)(1), unless the authorization is terminated or revoked.  Performed at [facility], [place] [address] [place], [place] [address]   Blood Culture (routine x 2)     Status: None   Collection Time: 05/06/21  8:25 PM   Specimen: BLOOD  Result Value Ref Range Status   Specimen Description   Final    BLOOD PICC LINE FEMORAL ARTERY Performed at [facility] [address], [place], [place] [address]    Special Requests   Final    BOTTLES DRAWN AEROBIC AND ANAEROBIC Blood Culture adequate volume Performed at [facility] [address] [place], [place] [address]     Culture   Final    NO GROWTH 5 DAYS Performed at [facility], [place] [address] [place], [place] [address]    Report Status 05/12/2021 FINAL  Final  Blood Culture (routine x 2)     Status: None   Collection Time: 05/06/21  8:26 PM   Specimen: BLOOD LEFT ARM  Result Value Ref Range Status   Specimen Description   Final    BLOOD LEFT ARM Performed at [facility], [place] [address] [place], [place] [address]    Special Requests   Final    BOTTLES DRAWN AEROBIC AND ANAEROBIC Blood Culture results may not be optimal due to an inadequate volume of blood received in culture bottles Performed at [facility] [address], [place], [place] [address]    Culture   Final    NO GROWTH 5 DAYS Performed at [facility], [place] [address] [place], [place] [address]    Report Status 05/12/2021 FINAL  Final  Urine culture     Status: None  Collection Time: 05/06/21  8:26 PM   Specimen: In/Out Cath Urine  Result Value Ref Range Status   Specimen Description   Final    IN/OUT CATH URINE Performed at [facility], [place] [address] [place], [place] [address]    Special Requests   Final    NONE Performed at [facility], [place] [address], [place], [place] [address]    Culture   Final    NO GROWTH Performed at [facility], [place] [address], [place], [place] [address]    Report Status 05/08/2021 FINAL  Final     Labs: BNP (last 3 results) Recent Labs    05/06/21 2010  BNP 50.5   Basic Metabolic Panel: Recent Labs  Lab 05/11/21 0403 05/12/21 0413 05/13/21 0500 05/14/21 0441 05/15/21 0451 05/16/21 0348  NA 137 140 138 137 139 137  K 3.0* 3.0* 4.1 3.5 3.4* 3.7  CL 111 114* 112* 111 111 109  CO2 21* _0 GLUCOSE 106* 101* 108* 106* 109* 120*  BUN 9 6 <5* 5* 10 15  CREATININE 1.03* 1.01* 0.94 1.06* 1.21* 0.99  CALCIUM 7.0* 7.3* 7.9* 7.6* 7.8* 8.2*  MG 1.8 1.6* 1.6* 1.7 1.7 1.9  PHOS 1.8*  --   1.1* 2.3* 3.3 2.8   Liver Function Tests: Recent Labs  Lab 05/10/21 1828 05/11/21 0403 05/13/21 0500 05/14/21 0441 05/15/21 0451  AST 62* 48* 35 26 21  ALT 398* 346* 206* 146* 107*  ALKPHOS 211* 200* 189* 170* 165*  BILITOT 0.7 0.5 0.5 0.5 0.4  PROT 5.6* 5.5* 5.8* 5.4* 5.4*  ALBUMIN 2.2* 2.3* 2.5* 2.4* 2.3*   Recent Labs  Lab 05/13/21 0500  LIPASE 31   No results for input(s): AMMONIA in the last 168 hours. CBC: Recent Labs  Lab 05/10/21 0357 05/11/21 0403 05/13/21 0500 05/14/21 0441 05/15/21 0451  WBC 3.4* 3.0* 4.4 3.9* 3.4*  NEUTROABS  --   --  1.8 1.7 1.1*  HGB 9.3* 9.1* 9.7* 9.3* 8.5*  HCT 28.1* 27.7* 28.7* 28.2* 26.2*  MCV 93.4 91.4 91.4 92.2 93.6  PLT 160 164 202 198 180   Cardiac Enzymes: No results for input(s): CKTOTAL, CKMB, CKMBINDEX, TROPONINI in the last 168 hours. BNP: Invalid input(s): POCBNP CBG: Recent Labs  Lab 05/15/21 0801 05/15/21 1133 05/15/21 1746 05/16/21 0011 05/16/21 0633  GLUCAP 94 106* 115* 121* 108*   D-Dimer No results for input(s): DDIMER in the last 72 hours. Hgb A1c No results for input(s): HGBA1C in the last 72 hours. Lipid Profile Recent Labs    05/14/21 0441 05/15/21 0451  TRIG 63 62   Thyroid function studies No results for input(s): TSH, T4TOTAL, T3FREE, THYROIDAB in the last 72 hours.  Invalid input(s): FREET3 Anemia work up No results for input(s): VITAMINB12, FOLATE, FERRITIN, TIBC, IRON, RETICCTPCT in the last 72 hours. Urinalysis    Component Value Date/Time   COLORURINE YELLOW 05/06/2021 2026   APPEARANCEUR CLEAR 05/06/2021 2026   LABSPEC 1.014 05/06/2021 2026   PHURINE 8.0 05/06/2021 2026   GLUCOSEU NEGATIVE 05/06/2021 2026   HGBUR NEGATIVE 05/06/2021 2026   BILIRUBINUR NEGATIVE 05/06/2021 2026   KETONESUR NEGATIVE 05/06/2021 2026   PROTEINUR >=300 (A) 05/06/2021 2026   NITRITE NEGATIVE 05/06/2021 2026   LEUKOCYTESUR NEGATIVE 05/06/2021 2026   Sepsis Labs Invalid input(s): PROCALCITONIN,   WBC,  LACTICIDVEN Microbiology Recent Results (from the past 240 hour(s))  Resp Panel by RT-PCR (Flu A&B, Covid) Nasopharyngeal Swab  Status: None   Collection Time: 05/06/21  8:10 PM   Specimen: Nasopharyngeal Swab; Nasopharyngeal(NP) swabs in vial transport medium  Result Value Ref Range Status   SARS Coronavirus 2 by RT PCR NEGATIVE NEGATIVE Final    Comment: (NOTE) SARS-CoV-2 target nucleic acids are NOT DETECTED.  The SARS-CoV-2 RNA is generally detectable in upper respiratory specimens during the acute phase of infection. The lowest concentration of SARS-CoV-2 viral copies this assay can detect is 138 copies/mL. A negative result does not preclude SARS-Cov-2 infection and should not be used as the sole basis for treatment or other patient management decisions. A negative result may occur with  improper specimen collection/handling, submission of specimen other than nasopharyngeal swab, presence of viral mutation(s) within the areas targeted by this assay, and inadequate number of viral copies(<138 copies/mL). A negative result must be combined with clinical observations, patient history, and epidemiological information. The expected result is Negative.  Fact Sheet for Patients:  EntrepreneurPulse.com.au  Fact Sheet for Healthcare Providers:  IncredibleEmployment.be  This test is no t yet approved or cleared by the [place] FDA and  has been authorized for detection and/or diagnosis of SARS-CoV-2 by FDA under an Emergency Use Authorization (EUA). This EUA will remain  in effect (meaning this test can be used) for the duration of the COVID-19 declaration under Section 564(b)(1) of the Act, 21 U.S.C.section 360bbb-3(b)(1), unless the authorization is terminated  or revoked sooner.       Influenza A by PCR NEGATIVE NEGATIVE Final   Influenza B by PCR NEGATIVE NEGATIVE Final    Comment: (NOTE) The Xpert Xpress SARS-CoV-2/FLU/RSV  plus assay is intended as an aid in the diagnosis of influenza from Nasopharyngeal swab specimens and should not be used as a sole basis for treatment. Nasal washings and aspirates are unacceptable for Xpert Xpress SARS-CoV-2/FLU/RSV testing.  Fact Sheet for Patients: EntrepreneurPulse.com.au  Fact Sheet for Healthcare Providers: IncredibleEmployment.be  This test is not yet approved or cleared by the [place] FDA and has been authorized for detection and/or diagnosis of SARS-CoV-2 by FDA under an Emergency Use Authorization (EUA). This EUA will remain in effect (meaning this test can be used) for the duration of the COVID-19 declaration under Section 564(b)(1) of the Act, 21 U.S.C. section 360bbb-3(b)(1), unless the authorization is terminated or revoked.  Performed at [facility], [place] [address], [place], [place] [address]   Blood Culture (routine x 2)     Status: None   Collection Time: 05/06/21  8:25 PM   Specimen: BLOOD  Result Value Ref Range Status   Specimen Description   Final    BLOOD PICC LINE FEMORAL ARTERY Performed at [place] [address] [place], [place] [address]    Special Requests   Final    BOTTLES DRAWN AEROBIC AND ANAEROBIC Blood Culture adequate volume Performed at [place] [address], [place], [place] [address]    Culture   Final    NO GROWTH 5 DAYS Performed at [facility], [place] [address], [place], [place] [address]    Report Status 05/12/2021 FINAL  Final  Blood Culture (routine x 2)     Status: None   Collection Time: 05/06/21  8:26 PM   Specimen: BLOOD LEFT ARM  Result Value Ref Range Status   Specimen Description   Final    BLOOD LEFT ARM Performed at [facility], [place] [address], [place], [place] [address]  Special Requests   Final    BOTTLES DRAWN AEROBIC AND ANAEROBIC Blood Culture results may not be optimal  due to an inadequate volume of blood received in culture bottles Performed at [place] [address], [place], [place] [address]    Culture   Final    NO GROWTH 5 DAYS Performed at [facility], [place] [address], [place], [place] [address]    Report Status 05/12/2021 FINAL  Final  Urine culture     Status: None   Collection Time: 05/06/21  8:26 PM   Specimen: In/Out Cath Urine  Result Value Ref Range Status   Specimen Description   Final    IN/OUT CATH URINE Performed at [place] [address] [place], [place] [address]    Special Requests   Final    NONE Performed at [facility], [place] [address] [place], [place] [address]    Culture   Final    NO GROWTH Performed at [facility], [place] [address] [place], [place] [address]    Report Status 05/08/2021 FINAL  Final     Time coordinating discharge: Over 30 minutes  SIGNED:   Darliss Cheney, MD  [facility] 05/16/2021, 10:59 AM  If 7PM-7AM, please contact night-coverage [email or web address]

## 2021-05-16 NOTE — Progress Notes (Addendum)
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   Assessment: Patient's home TPN has been on hold since admission on 6/12 due to elevated LFTs. Now with improvement in LFTs, will resumed TPN per Md orders on 6/19. Note that due to TPN being held since admission and unknown whether TPN was sole reason for LFT elevation, started TPN dosing over with continuous TPN then transition to cyclic as tolerated  Glucose / Insulin: am glucoses at goal < 150 Electrolytes: K 3.7, phos 2.8, mag 1.9 Renal: SCr up 1.21, BUN 10 Hepatic: AST/ALT down 21/107 Alb/prealbumin: alb 2.3, prealb 25.7 Intake / Output; MIVF: Net +2284.2, D5LR at 75 ml/hr GI Imaging: GI Surgeries / Procedures:   Central access: PICC line TPN start date: 6/19  Nutritional Goals (per RD recommendation on 6/19): kCal: 1800-2000, Protein: 90-105, Fluid: >= 2 L/day  Goal TPN rate is 80 mL/hr (provides 96 g of protein and 1939 kcals per day)  Current Nutrition:  Regular diet, TPN  Plan:  Patient discharging today Discussed with [employer] pharmacist- recommended lower protein goal given elevated LFTs on admission and now with increasing BUN  Dimple Nanas, PharmD PGY-1 Acute Care Pharmacy Resident 05/16/2021 9:59 AM

## 2021-05-16 NOTE — Progress Notes (Signed)
Physical Therapy Treatment and Discharge Patient Details Name: Caitlin Peters MRN: [id number] DOB: 1961/02/05 Today's Date: 05/16/2021    History of Present Illness 60 yo female admitted with SIRS, abd pain. Hx of Crohns, short gut syndrome, chronic pain, chronic TPN, multiple bowel resections, Sz, CKD, panreatitis    PT Comments    Pt mobilizing well and feels back to baseline.  Pt has assist from family at home if needed and eager to d/c today.  PT to sign off as pt modified independent with mobility.   Follow Up Recommendations  No PT follow up     Equipment Recommendations  None recommended by PT    Recommendations for Other Services       Precautions / Restrictions Precautions Precautions: None    Mobility  Bed Mobility Overal bed mobility: Modified Independent                  Transfers Overall transfer level: Modified independent Equipment used: None Transfers: Sit to/from Stand Sit to Stand: Supervision            Ambulation/Gait Ambulation/Gait assistance: Supervision Gait Distance (Feet): 350 Feet Assistive device: IV Pole Gait Pattern/deviations: Step-through pattern;Decreased stride length     General Gait Details: no unsteadiness or physical assist required   Stairs             Wheelchair Mobility    Modified Rankin (Stroke Patients Only)       Balance                                            Cognition Arousal/Alertness: Awake/alert Behavior During Therapy: WFL for tasks assessed/performed Overall Cognitive Status: Within Functional Limits for tasks assessed                                 General Comments: pleasant and motivated      Exercises      General Comments        Pertinent Vitals/Pain Pain Assessment: No/denies pain Pain Intervention(s): Premedicated before session    Home Living                      Prior Function            PT Goals (current  goals can now be found in the care plan section) Progress towards PT goals: Goals met/education completed, patient discharged from PT    Frequency    Min 3X/week      PT Plan Current plan remains appropriate    Co-evaluation              AM-PAC PT "6 Clicks" Mobility   Outcome Measure  Help needed turning from your back to your side while in a flat bed without using bedrails?: None Help needed moving from lying on your back to sitting on the side of a flat bed without using bedrails?: None Help needed moving to and from a bed to a chair (including a wheelchair)?: None Help needed standing up from a chair using your arms (e.g., wheelchair or bedside chair)?: None Help needed to walk in hospital room?: None Help needed climbing 3-5 steps with a railing? : A Little 6 Click Score: 23    End of Session   Activity Tolerance: Patient tolerated treatment well  Patient left: in bed;with call bell/phone within reach   PT Visit Diagnosis: Muscle weakness (generalized) (M62.81)     Time: 3167-4255 PT Time Calculation (min) (ACUTE ONLY): 14 min  Charges:  $Gait Training: 8-22 mins                     Jannette Spanner PT, DPT Acute Rehabilitation Services Pager: [phone] Office: [phone]    York Ram E 05/16/2021, 1:22 PM

## 2021-05-16 NOTE — Plan of Care (Signed)
  Problem: Nutrition: Goal: Adequate nutrition will be maintained Outcome: Adequate for Discharge   Problem: Activity: Goal: Risk for activity intolerance will decrease Outcome: Adequate for Discharge   Problem: Elimination: Goal: Will not experience complications related to bowel motility Outcome: Adequate for Discharge   Problem: Pain Managment: Goal: General experience of comfort will improve Outcome: Adequate for Discharge   Problem: Skin Integrity: Goal: Risk for impaired skin integrity will decrease Outcome: Adequate for Discharge

## 2021-05-16 NOTE — TOC Transition Note (Signed)
Transition of Care [facility]) - CM/SW Discharge Note   Patient Details  Name: Caitlin Peters MRN: [id number] Date of Birth: [birth year]  [facility] of [facility] CM/SW Contact:  Dessa Phi, RN Phone Number: 05/16/2021, 3:24 PM   Clinical Narrative: Alvis Lemmings already active w/HHRN. [employer] rep Pam alredy active w/TPN resumption orders placed. No further CM needs.      Final next level of care: [place] Barriers to Discharge: No Barriers Identified   Patient Goals and CMS Choice Patient states their goals for this hospitalization and ongoing recovery are:: go home CMS Medicare.gov Compare Post Acute Care list provided to:: Patient Choice offered to / list presented to : Patient  Discharge Placement                       Discharge Plan and Services   Discharge Planning Services: CM Consult Post Acute Care Choice: Home Health                               Social Determinants of Health (SDOH) Interventions     Readmission Risk Interventions Readmission Risk Prevention Plan 02/10/2021 12/21/2020 11/09/2020  Transportation Screening Complete Complete Complete  PCP or Specialist Appt within 3-5 Days - - -  Not Complete comments - - -  [place] or [place] Work Consult for [place] Planning/Counseling - - -  SW consult not completed comments - - -  Palliative Care Screening - - -  Medication Review Press photographer) Complete - Complete  PCP or Specialist appointment within 3-5 days of discharge - Complete Complete  HRI or Home Care Consult Complete Complete Complete  SW Recovery Care/Counseling Consult Complete Complete -  Palliative Care Screening Not Applicable - Not [place] Not Applicable - Not Applicable

## 2021-05-17 ENCOUNTER — Telehealth: Payer: Self-pay

## 2021-05-17 NOTE — Telephone Encounter (Signed)
Transition Care Management Unsuccessful Follow-up Telephone Call  Date of discharge and from where:  Caitlin Peters 05/16/2021  Attempts:  1st Attempt  Reason for unsuccessful TCM follow-up call:  No answer/busy

## 2021-05-18 ENCOUNTER — Telehealth: Payer: Self-pay

## 2021-05-18 NOTE — Telephone Encounter (Signed)
Transition Care Management Unsuccessful Follow-up Telephone Call  Date of discharge and from where:  05/16/2021  [facility]   Attempts:  2nd Attempt  Reason for unsuccessful TCM follow-up call:  No answer/busy

## 2021-05-22 ENCOUNTER — Observation Stay (HOSPITAL_COMMUNITY): Payer: Medicare Other

## 2021-05-22 ENCOUNTER — Other Ambulatory Visit: Payer: Self-pay

## 2021-05-22 ENCOUNTER — Inpatient Hospital Stay (HOSPITAL_COMMUNITY)
Admission: EM | Admit: 2021-05-22 | Discharge: 2021-05-25 | DRG: 683 | Disposition: A | Discharge status: Home Health | Payer: Medicare Other | Attending: Internal Medicine | Admitting: Internal Medicine

## 2021-05-22 ENCOUNTER — Encounter: Payer: Self-pay | Admitting: Internal Medicine

## 2021-05-22 ENCOUNTER — Ambulatory Visit (INDEPENDENT_AMBULATORY_CARE_PROVIDER_SITE_OTHER): Payer: Medicare Other | Admitting: Internal Medicine

## 2021-05-22 ENCOUNTER — Emergency Department (HOSPITAL_COMMUNITY): Payer: Medicare Other

## 2021-05-22 ENCOUNTER — Encounter (HOSPITAL_COMMUNITY): Payer: Self-pay | Admitting: Emergency Medicine

## 2021-05-22 VITALS — BP 181/84 | HR 67 | Temp 98.0°F | Wt 144.6 lb

## 2021-05-22 DIAGNOSIS — R109 Unspecified abdominal pain: Secondary | ICD-10-CM | POA: Diagnosis not present

## 2021-05-22 DIAGNOSIS — K912 Postsurgical malabsorption, not elsewhere classified: Secondary | ICD-10-CM

## 2021-05-22 DIAGNOSIS — K50818 Crohn's disease of both small and large intestine with other complication: Secondary | ICD-10-CM | POA: Diagnosis present

## 2021-05-22 DIAGNOSIS — R7401 Elevation of levels of liver transaminase levels: Secondary | ICD-10-CM | POA: Diagnosis present

## 2021-05-22 DIAGNOSIS — Z888 Allergy status to other drugs, medicaments and biological substances status: Secondary | ICD-10-CM

## 2021-05-22 DIAGNOSIS — M25511 Pain in right shoulder: Secondary | ICD-10-CM | POA: Diagnosis not present

## 2021-05-22 DIAGNOSIS — M4624 Osteomyelitis of vertebra, thoracic region: Secondary | ICD-10-CM | POA: Diagnosis not present

## 2021-05-22 DIAGNOSIS — R945 Abnormal results of liver function studies: Secondary | ICD-10-CM | POA: Diagnosis present

## 2021-05-22 DIAGNOSIS — K50819 Crohn's disease of both small and large intestine with unspecified complications: Secondary | ICD-10-CM | POA: Diagnosis present

## 2021-05-22 DIAGNOSIS — K219 Gastro-esophageal reflux disease without esophagitis: Secondary | ICD-10-CM | POA: Diagnosis present

## 2021-05-22 DIAGNOSIS — R600 Localized edema: Secondary | ICD-10-CM | POA: Diagnosis present

## 2021-05-22 DIAGNOSIS — M810 Age-related osteoporosis without current pathological fracture: Secondary | ICD-10-CM | POA: Diagnosis present

## 2021-05-22 DIAGNOSIS — R5381 Other malaise: Secondary | ICD-10-CM | POA: Diagnosis present

## 2021-05-22 DIAGNOSIS — N1831 Chronic kidney disease, stage 3a: Secondary | ICD-10-CM | POA: Diagnosis not present

## 2021-05-22 DIAGNOSIS — G894 Chronic pain syndrome: Secondary | ICD-10-CM | POA: Diagnosis present

## 2021-05-22 DIAGNOSIS — R531 Weakness: Secondary | ICD-10-CM

## 2021-05-22 DIAGNOSIS — Z20822 Contact with and (suspected) exposure to covid-19: Secondary | ICD-10-CM | POA: Diagnosis present

## 2021-05-22 DIAGNOSIS — Z8739 Personal history of other diseases of the musculoskeletal system and connective tissue: Secondary | ICD-10-CM | POA: Diagnosis not present

## 2021-05-22 DIAGNOSIS — Z885 Allergy status to narcotic agent status: Secondary | ICD-10-CM

## 2021-05-22 DIAGNOSIS — D638 Anemia in other chronic diseases classified elsewhere: Secondary | ICD-10-CM | POA: Diagnosis present

## 2021-05-22 DIAGNOSIS — M62838 Other muscle spasm: Secondary | ICD-10-CM | POA: Diagnosis present

## 2021-05-22 DIAGNOSIS — E860 Dehydration: Secondary | ICD-10-CM | POA: Diagnosis present

## 2021-05-22 DIAGNOSIS — M25512 Pain in left shoulder: Secondary | ICD-10-CM

## 2021-05-22 DIAGNOSIS — F32A Depression, unspecified: Secondary | ICD-10-CM | POA: Diagnosis not present

## 2021-05-22 DIAGNOSIS — K5904 Chronic idiopathic constipation: Secondary | ICD-10-CM | POA: Diagnosis not present

## 2021-05-22 DIAGNOSIS — Z820 Family history of epilepsy and other diseases of the nervous system: Secondary | ICD-10-CM

## 2021-05-22 DIAGNOSIS — I129 Hypertensive chronic kidney disease with stage 1 through stage 4 chronic kidney disease, or unspecified chronic kidney disease: Secondary | ICD-10-CM | POA: Diagnosis not present

## 2021-05-22 DIAGNOSIS — Z79818 Long term (current) use of other agents affecting estrogen receptors and estrogen levels: Secondary | ICD-10-CM

## 2021-05-22 DIAGNOSIS — Z87891 Personal history of nicotine dependence: Secondary | ICD-10-CM

## 2021-05-22 DIAGNOSIS — Z8249 Family history of ischemic heart disease and other diseases of the circulatory system: Secondary | ICD-10-CM | POA: Diagnosis not present

## 2021-05-22 DIAGNOSIS — M4644 Discitis, unspecified, thoracic region: Secondary | ICD-10-CM | POA: Diagnosis not present

## 2021-05-22 DIAGNOSIS — Z9049 Acquired absence of other specified parts of digestive tract: Secondary | ICD-10-CM

## 2021-05-22 DIAGNOSIS — F419 Anxiety disorder, unspecified: Secondary | ICD-10-CM | POA: Diagnosis present

## 2021-05-22 DIAGNOSIS — N179 Acute kidney failure, unspecified: Principal | ICD-10-CM | POA: Diagnosis present

## 2021-05-22 DIAGNOSIS — Z79899 Other long term (current) drug therapy: Secondary | ICD-10-CM | POA: Diagnosis not present

## 2021-05-22 DIAGNOSIS — R7989 Other specified abnormal findings of blood chemistry: Secondary | ICD-10-CM | POA: Diagnosis present

## 2021-05-22 LAB — CBC WITH DIFFERENTIAL/PLATELET
Abs Immature Granulocytes: 0.01 10*3/uL (ref 0.00–0.07)
Basophils Absolute: 0 10*3/uL (ref 0.0–0.1)
Basophils Relative: 1 %
Eosinophils Absolute: 0.1 10*3/uL (ref 0.0–0.5)
Eosinophils Relative: 3 %
HCT: 31.2 % — ABNORMAL LOW (ref 36.0–46.0)
Hemoglobin: 9.9 g/dL — ABNORMAL LOW (ref 12.0–15.0)
Immature Granulocytes: 0 %
Lymphocytes Relative: 37 %
Lymphs Abs: 2 10*3/uL (ref 0.7–4.0)
MCH: 30.5 pg (ref 26.0–34.0)
MCHC: 31.7 g/dL (ref 30.0–36.0)
MCV: 96 fL (ref 80.0–100.0)
Monocytes Absolute: 0.4 10*3/uL (ref 0.1–1.0)
Monocytes Relative: 6 %
Neutro Abs: 2.9 10*3/uL (ref 1.7–7.7)
Neutrophils Relative %: 53 %
Platelets: 225 10*3/uL (ref 150–400)
RBC: 3.25 MIL/uL — ABNORMAL LOW (ref 3.87–5.11)
RDW: 14.9 % (ref 11.5–15.5)
WBC: 5.5 10*3/uL (ref 4.0–10.5)
nRBC: 0 % (ref 0.0–0.2)

## 2021-05-22 LAB — COMPREHENSIVE METABOLIC PANEL
ALT: 127 U/L — ABNORMAL HIGH (ref 0–44)
AST: 54 U/L — ABNORMAL HIGH (ref 15–41)
Albumin: 2.9 g/dL — ABNORMAL LOW (ref 3.5–5.0)
Alkaline Phosphatase: 199 U/L — ABNORMAL HIGH (ref 38–126)
Anion gap: 8 (ref 5–15)
BUN: 36 mg/dL — ABNORMAL HIGH (ref 6–20)
CO2: 23 mmol/L (ref 22–32)
Calcium: 8.7 mg/dL — ABNORMAL LOW (ref 8.9–10.3)
Chloride: 107 mmol/L (ref 98–111)
Creatinine, Ser: 1.63 mg/dL — ABNORMAL HIGH (ref 0.44–1.00)
GFR, Estimated: 36 mL/min — ABNORMAL LOW (ref >60)
Glucose, Bld: 75 mg/dL (ref 70–99)
Potassium: 4.5 mmol/L (ref 3.5–5.1)
Sodium: 138 mmol/L (ref 135–145)
Total Bilirubin: 0.6 mg/dL (ref 0.3–1.2)
Total Protein: 6.8 g/dL (ref 6.5–8.1)

## 2021-05-22 MED ORDER — HYDROMORPHONE HCL 1 MG/ML IJ SOLN
1.0000 mg | Freq: Once | INTRAMUSCULAR | Status: AC
Start: 2021-05-22 — End: 2021-05-22
  Administered 2021-05-22: 1 mg via INTRAVENOUS
  Filled 2021-05-22: qty 1

## 2021-05-22 MED ORDER — ONDANSETRON HCL 4 MG/2ML IJ SOLN
4.0000 mg | Freq: Once | INTRAMUSCULAR | Status: AC
  Administered 2021-05-22: 4 mg via INTRAVENOUS
  Filled 2021-05-22: qty 2

## 2021-05-22 MED ORDER — HYDROMORPHONE HCL 1 MG/ML IJ SOLN
1.0000 mg | Freq: Once | INTRAMUSCULAR | Status: AC
  Administered 2021-05-22: 1 mg via INTRAVENOUS
  Filled 2021-05-22: qty 1

## 2021-05-22 MED ORDER — SODIUM CHLORIDE 0.9 % IV BOLUS
1000.0000 mL | Freq: Once | INTRAVENOUS | Status: AC
  Administered 2021-05-22: 1000 mL via INTRAVENOUS

## 2021-05-22 NOTE — ED Notes (Signed)
Pt in MRI, MRI to bring pt when finished

## 2021-05-22 NOTE — ED Provider Notes (Addendum)
Emergency Medicine Provider Triage Evaluation Note  Caitlin Peters , a 60 y.o. female  was evaluated in triage.  Pt complains of left shoulder pain, swelling to the ble. Send from ID for mri left shoulder. States after leaving id appt she had an episode of incontinence. She c/o some neck pain  Review of Systems  Positive: Left shoulder pain, neck pain, incontinence, ble swelling Negative: fever  Physical Exam  BP 117/60 (BP Location: Right Arm)   Pulse 69   Temp 98.2 F (36.8 C)   Resp 20   SpO2 100%  Gen:   Awake, no distress   Resp:  Normal effort  MSK:   Moves extremities without difficulty  Other:  Midline cervical spine ttp, from of the left shoulder, ble edema  Medical Decision Making  Medically screening exam initiated at 3:41 PM.  Appropriate orders placed.  APARNA VANDERWEELE was informed that the remainder of the evaluation will be completed by another provider, this initial triage assessment does not replace that evaluation, and the importance of remaining in the ED until their evaluation is complete.  Discussed with Dr. Dina Rich about whether or not to obtain mri cervical spine given episode of incontinence.  She agrees to order this test  MRI contact me and states that we would not be able to complete cervical and shoulder mri with contrast as they are not continuous studies and would likely need delay between   3:53 discussed case with Dr. Baxter Flattery of infectious disease. She recommends mri shoulder only and does not feel cspine is necessary.   Rodney Booze, PA-C 05/22/21 1541    Rodney Booze, PA-C 05/22/21 1556    Lorelle Gibbs, DO 05/25/21 1112

## 2021-05-22 NOTE — ED Notes (Signed)
MRI called and notified this RN that pt wants pain meds and they were unable to do scan. Notified Vanita Panda MD

## 2021-05-22 NOTE — ED Notes (Signed)
Patient transported to MRI 

## 2021-05-22 NOTE — Progress Notes (Signed)
Pt could not tolerate laying down for MRI due to extreme pain. Pt refused exam at this time. Pt states Need for pain meds. Will attempt at later time after meds are given or pt is able to lay still for exam. Unable to scan pt in this state.

## 2021-05-22 NOTE — ED Triage Notes (Addendum)
Pt here from MD office with c/o gen pain, pt was just seen at MD office for pain today also pain mostly to the left shoulder and some swelling to he feet and abd

## 2021-05-22 NOTE — ED Provider Notes (Signed)
[name] EMERGENCY DEPARTMENT Provider Note   CSN: [id number] Arrival date & time: 05/22/21  1343     History No chief complaint on file.   Caitlin Peters is a 60 y.o. female.  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 She notes that since discharge about a week ago she has been generally doing poorly, with worsening weakness, anorexia, absence of substantial improvement in spite of taking her home narcotic regimen. Today with atraumatic left shoulder pain she saw her infectious disease physician, was referred for MRI of the shoulder, and sent here for evaluation once her pain did not improve with typical medication. She denies interval fever, vomiting, notes that she does have ongoing loose stool, not unusual for her, has no new abdominal pain, but has persistent ongoing discomfort.    Past Medical History:  Diagnosis Date   Acute pancreatitis 04/13/2020   Anasarca 10/2019   AVN (avascular necrosis of bone) ([name])    Cataract    Choledocholithiasis (sludge) s/p ERCP 10/2019 10/21/2020   Chronic pain syndrome    CKD (chronic kidney disease), stage III ([place])    Crohn disease ([place])    Crohn's disease of small & large intestine with SGS 1984   Caitlin Peters is a 60 year old female with a history of Crohn's disease diagnosed in 59 (age 24), history of long-term steroid use with osteoporosis, S/P multiple bowel resections ([id number]) complicated by chronic back and abdominal pain, steatorrhea and short bowel syndrome. The patient has been left with ~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 attached to proximal transverse colon through rectum. 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; 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; jejunal to transverse colon anastomosis Treated at [facility] SB lengthening to 165cm Dr Alene Mires, [facility]  ESTABLISHED AT [facility]   Vitamin B12 deficiency     Patient Active Problem List   Diagnosis Date Noted   Elevated LFTs    Sepsis ([place]) 05/06/2021   Acidosis 04/08/2021   Colitis 04/07/2021   Intractable nausea and vomiting 03/11/2021   Thoracic discitis 02/10/2021   Pressure injury of skin 12/18/2020   Bacteremia 12/17/2020   Seizures ([place]) 12/17/2020   Drug-seeking behavior 12/16/2020   Seizure ([place]) 12/15/2020   CKD (chronic kidney disease) stage 3, GFR 30-59 ml/min ([place]) 12/06/2020   COVID-19 virus infection 12/06/2020   Protein calorie malnutrition ([place]) 12/06/2020   Palpitations 11/22/2020   PAC (premature atrial contraction) 11/22/2020   History of central line-associated bloodstream infection (CLABSI) 10/21/2020   Nausea & vomiting 10/21/2020   Choledocholithiasis (sludge) s/p ERCP 10/2019 10/21/2020   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   Abdominal pain 83/15/1761   Acute metabolic encephalopathy    Hypomagnesemia    Partial small bowel obstruction ([place])    Bacteremia associated with intravascular line ([place]) 08/04/2020   Enterobacter sepsis ([place]) 07/22/2020   Fever of unknown origin 06/27/2020   Anxiety 06/03/2020   Acute pancreatitis  04/13/2020   Infection due to Acinetobacter species 03/10/2020   Pancytopenia ([name]) 03/05/2020   Central line infection    Elevated liver enzymes 01/02/2020   Cholangitis    Anasarca 10/28/2019   Acute kidney injury superimposed on chronic kidney disease ([place]) 10/28/2019   Falls 10/28/2019   Malnutrition ([place])    Vitamin B12 deficiency    GERD (gastroesophageal  reflux disease)    Chronic pain syndrome    Hypokalemia due to excessive gastrointestinal loss of potassium 10/13/2019   Fever    Hypokalemia 10/12/2019   Chronic diarrhea 10/12/2019   Dysuria 10/12/2019   Bilateral lower extremity edema 10/12/2019   AKI (acute kidney injury) ([place]) 10/12/2019   Fungemia 08/27/2019   IDA (iron deficiency anemia) 11/03/2018   Dilation of biliary tract 08/28/2018   Severe diarrhea 03/07/2018   LFTs abnormal 01/27/2018   GI tract obstruction ([place]) 01/27/2018   Gram-negative bacteremia 09/13/2017   HTN (hypertension), benign 12/02/2016   Intractable pain 12/02/2016   Anemia 12/02/2016   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   Avascular necrosis ([place]) 06/14/2015   Polyarthralgia 05/03/2015   On total parenteral nutrition (TPN) 12/30/2014   Osteoporosis 12/24/2014   Low back pain 12/16/2014   Vitamin D deficiency 12/16/2014   Short gut syndrome 07/15/2014   History of colonic diverticulitis 2014   Depression 07/24/2012   Gram-positive bacteremia 07/24/2012   Small bowel obstruction due to adhesions ([place]) 07/24/2012   Diarrhea 12/13/2011   Neuralgia and neuritis 06/01/2011   Myalgia and myositis 08/12/2003   Crohn's disease of small & large intestine with SGS 1984    Past Surgical History:  Procedure Laterality Date   ABDOMINAL ADHESION SURGERY  01/22/2018   APPENDECTOMY  1989   BILIARY DILATION  11/26/2019   Procedure: BILIARY DILATION;  Surgeon: Jackquline Denmark, MD;  Location: [facility] ENDOSCOPY;  Service: Endoscopy;;   BILIARY DILATION  03/08/2020   Procedure: BILIARY DILATION;  Surgeon: Irving Copas., MD;  Location: [facility];  Service: Gastroenterology;;   BIOPSY  03/08/2020   Procedure: BIOPSY;  Surgeon: Irving Copas., MD;  Location: [facility] ENDOSCOPY;  Service: Gastroenterology;;   CHEST WALL RESECTION     right thoracotomy,resection of chest mass with anterior rib and reconstruction using prosthetic mesh and video arthroscopy    CHOLECYSTECTOMY  01/22/2018   COLONOSCOPY  2019   ENTEROSTOMY CLOSURE  04/1999   ERCP N/A 11/26/2019   Procedure: ENDOSCOPIC RETROGRADE CHOLANGIOPANCREATOGRAPHY (ERCP);  Surgeon: Jackquline Denmark, MD;  Location: [facility];  Service: Endoscopy;  Laterality: N/A;   ERCP N/A 03/08/2020   Procedure: ENDOSCOPIC RETROGRADE CHOLANGIOPANCREATOGRAPHY (ERCP);  Surgeon: Irving Copas., MD;  Location: [facility];  Service: Gastroenterology;  Laterality: N/A;   ESOPHAGOGASTRODUODENOSCOPY N/A 03/08/2020   Procedure: ESOPHAGOGASTRODUODENOSCOPY (EGD);  Surgeon: Irving Copas., MD;  Location: [facility];  Service: Gastroenterology;  Laterality: N/A;   EUS N/A 03/08/2020   Procedure: UPPER ENDOSCOPIC ULTRASOUND (EUS) LINEAR;  Surgeon: Irving Copas., MD;  Location: [facility];  Service: Gastroenterology;  Laterality: N/A;   ILEOCECETOMY  03/1999   ileocolon resection with abdominal stoma   ILEOSTOMY CLOSURE  2001   IR FLUORO GUIDE CV LINE LEFT  01/07/2020   IR FLUORO GUIDE CV LINE LEFT  03/09/2020   IR FLUORO GUIDE CV LINE LEFT  05/09/2020   IR FLUORO GUIDE CV LINE LEFT  07/20/2020   IR FLUORO GUIDE CV LINE RIGHT  08/05/2020   IR FLUORO GUIDE CV LINE RIGHT  04/03/2021   IR PTA VENOUS EXCEPT DIALYSIS  CIRCUIT  01/07/2020   IR REMOVAL TUN CV CATH W/O FL  08/05/2020   IR US GUIDE VASC ACCESS LEFT     x 2 06/17/19 and 09/14/2019   IR US GUIDE VASC ACCESS RIGHT  08/05/2020   KNEE SURGERY     right knee    LAPAROSCOPIC SMALL BOWEL RESECTION  2009   2000-2009.  SB resections for Crohns Disease - now with Short gut   OMENTECTOMY  01/22/2018   PARTIAL HYSTERECTOMY  1984   with LSO   REMOVAL OF STONES  11/26/2019   Procedure: REMOVAL OF STONES;  Surgeon: Jackquline Denmark, MD;  Location: [facility];  Service: Endoscopy;;   REMOVAL OF STONES  03/08/2020   Procedure: REMOVAL OF STONES;  Surgeon: Irving Copas., MD;  Location: [facility]OSCOPY;  Service: Gastroenterology;;    SALPINGOOPHORECTOMY Left 1984   SALPINGOOPHORECTOMY Right 1990   SERIAL TRANSVERSE ENTEROPLASTY (STEP) - SMALL BOWEL LENGTHENING  01/22/2018   Dr Alene Mires, [facility] - SB length from 120 to 165cm    SMALL INTESTINE SURGERY  2002   SMALL INTESTINE SURGERY  2003   SPHINCTEROTOMY  11/26/2019   Procedure: SPHINCTEROTOMY;  Surgeon: Jackquline Denmark, MD;  Location: [facility];  Service: Endoscopy;;   TOTAL ABDOMINAL HYSTERECTOMY  1990   with RSO   UPPER GASTROINTESTINAL ENDOSCOPY       OB History   No obstetric history on file.     Family History  Problem Relation Age of Onset   Breast cancer Sister    Multiple sclerosis Sister    Diabetes Sister    Lupus Sister    Colon cancer Other    Crohn's disease Other    Seizures Mother    Glaucoma Mother    CAD Father    Heart disease Father    Hypertension Father     Social History   Tobacco Use   Smoking status: Former    Pack years: 0.00   Smokeless tobacco: Never  Vaping Use   Vaping Use: Never used  Substance Use Topics   Alcohol use: Not Currently   Drug use: Never    Home Medications Prior to Admission medications   Medication Sig Start Date End Date Taking? Authorizing Provider  acetaminophen (TYLENOL) 325 MG tablet Take 650 mg by mouth every 6 (six) hours as needed for mild pain.     [provider]  amLODipine (NORVASC) 10 MG tablet Take 10 mg by mouth daily.    [provider]  budesonide (ENTOCORT EC) 3 MG 24 hr capsule Take 3 capsules (9 mg total) by mouth daily. 03/06/21   [name], [name], MD  buPROPion [facility] SR) 100 MG 12 hr tablet Take 200 mg by mouth daily. 03/02/21   [provider]  Calcium 200 MG TABS Take 200 mg by mouth daily.    [provider]  carvedilol (COREG) 25 MG tablet Take 1 tablet (25 mg total) by mouth 2 (two) times daily with a meal. 05/16/21 06/15/21  Darliss Cheney, MD  cholecalciferol (VITAMIN D3) 25 MCG (1000 UT) tablet Take 1,000 Units  by mouth daily.     [provider]  cycloSPORINE (RESTASIS) 0.05 % ophthalmic emulsion Place 1 drop into both eyes 2 (two) times daily.    [provider]  denosumab (PROLIA) 60 MG/ML SOSY injection Inject 60 mg into the skin every 6 (six) months.    [provider]  dexlansoprazole (DEXILANT) 60 MG capsule Take 1 capsule (60  mg total) by mouth daily. 12/28/20   [name], [name], MD  Dextran 70-Hypromellose 0.1-0.3 % SOLN Place 1 drop into both eyes 4 (four) times daily.    [provider]  diphenoxylate-atropine (LOMOTIL) 2.5-0.025 MG tablet TAKE 1 TABLET BY MOUTH 4 TIMES DAILY AS NEEDED FOR DIARRHEA OR  LOOSE  STOOLS 12/28/20   [name], [name], MD  DULoxetine (CYMBALTA) 30 MG capsule TAKE 3 CAPSULES BY MOUTH ONCE DAILY 03/22/21   [name], [name], MD  estradiol (ESTRACE) 2 MG tablet Take 1 tablet (2 mg total) by mouth daily. 12/07/20   [name], [name], MD  famotidine (PEPCID) 20 MG tablet Take 20 mg by mouth daily as needed for heartburn or indigestion.    [provider]  GATTEX 5 MG KIT Inject 5 mLs into the skin daily. 01/03/21   [provider]  hydrALAZINE (APRESOLINE) 100 MG tablet Take 1 tablet (100 mg total) by mouth 3 (three) times daily. 05/16/21 06/15/21  Darliss Cheney, MD  HYDROmorphone (DILAUDID) 4 MG tablet Take 4 mg by mouth in the morning, at noon, in the evening, and at bedtime. 04/18/21   [provider]  levETIRAcetam (KEPPRA) 500 MG tablet Take 500 mg by mouth 2 (two) times daily.    [provider]  lipase/protease/amylase (CREON) 36000 UNITS CPEP capsule Take 1 capsule (36,000 Units total) by mouth 3 (three) times daily as needed (with meals for digestion). 06/28/20   [name], [name], MD  loperamide (IMODIUM) 2 MG capsule TAKE 1 CAPSULE BY MOUTH AS NEEDED FOR DIARRHEA OR LOOSE STOOLS 01/20/21   [name], [name], MD  methadone (DOLOPHINE) 5 MG tablet Take 1  tablet (5 mg total) by mouth 5 (five) times daily. 06/30/20   Florencia Reasons, MD  Multiple Vitamins-Minerals (MULTIVITAMIN ADULT PO) Take 1 tablet by mouth daily.    [provider]  NARCAN 4 MG/0.1ML LIQD nasal spray kit Place 1 spray into the nose once as needed (overdose). 10/14/20   [provider]  PRESCRIPTION MEDICATION Inject 1 each into the vein daily. Home TPN . [facility] [place] . 1 bag for 12 hours. [phone]    [provider]  Probiotic Product (PROBIOTIC-10 PO) Take 1 capsule by mouth daily.     [provider]  promethazine (PHENERGAN) 25 MG tablet TAKE 1 TABLET BY MOUTH EVERY 6 HOURS AS NEEDED FOR NAUSEA FOR VOMITING 04/21/21   [name], [name], MD  sodium chloride 0.9 % infusion Inject 1 mL into the vein daily as needed (flush). 10/16/20   [provider]  sucralfate (CARAFATE) 1 GM/10ML suspension Take 10 mLs (1 g total) by mouth 4 (four) times daily -  with meals and at bedtime. 06/28/20   [name], [name], MD  Trace Minerals Cu-Mn-Se-Zn (TRALEMENT IV) Inject 1 mL into the vein See admin instructions. Used in TPN bag 4 times weekly    [provider]  vitamin B-12 (CYANOCOBALAMIN) 100 MCG tablet Take 1,000 mcg by mouth daily.     [provider]    Allergies    Meperidine, Hyoscyamine, Cefepime, Gabapentin, Lyrica [pregabalin], Topamax [topiramate], Zosyn [piperacillin sod-tazobactam so], Fentanyl, and Morphine and related  Review of Systems   Review of Systems  Constitutional:        Per HPI, otherwise negative  HENT:         Per HPI, otherwise negative  Respiratory:  Per HPI, otherwise negative  Cardiovascular:        Per HPI, otherwise negative  Gastrointestinal:  Negative for vomiting.  Endocrine:       Negative aside from HPI  Genitourinary:        Neg aside from HPI   Musculoskeletal:        Per HPI, otherwise negative  Skin: Negative.    Allergic/Immunologic: Positive for immunocompromised state.  Neurological:  Negative for syncope.   Physical Exam Updated Vital Signs BP (!) 151/87 (BP Location: Left Arm)   Pulse 76   Temp 98.2 F (36.8 C)   Resp 20   SpO2 98%   Physical Exam Vitals and nursing note reviewed.  Constitutional:      General: She is not in acute distress.    Appearance: She is well-developed. She is ill-appearing. She is not toxic-appearing.  HENT:     Head: Normocephalic and atraumatic.  Eyes:     Conjunctiva/sclera: Conjunctivae normal.  Cardiovascular:     Rate and Rhythm: Normal rate and regular rhythm.  Pulmonary:     Effort: Pulmonary effort is normal. No respiratory distress.     Breath sounds: Normal breath sounds. No stridor.  Chest:    Abdominal:     General: There is no distension.     Tenderness: There is abdominal tenderness. There is no guarding or rebound.  Skin:    General: Skin is warm and dry.     Comments: No obvious erythema around the right thigh near the femoral line  Neurological:     Mental Status: She is alert and oriented to person, place, and time.     Cranial Nerves: No cranial nerve deficit.  Psychiatric:        Mood and Affect: Mood normal.    ED Results / Procedures / Treatments   Labs (all labs ordered are listed, but only abnormal results are displayed) Labs Reviewed  CBC WITH DIFFERENTIAL/PLATELET - Abnormal; Notable for the following components:      Result Value   RBC 3.25 (*)    Hemoglobin 9.9 (*)    HCT 31.2 (*)    All other components within normal limits  COMPREHENSIVE METABOLIC PANEL - Abnormal; Notable for the following components:   BUN 36 (*)    Creatinine, Ser 1.63 (*)    Calcium 8.7 (*)    Albumin 2.9 (*)    AST 54 (*)    ALT 127 (*)    Alkaline Phosphatase 199 (*)    GFR, Estimated 36 (*)    All other components within normal limits  SARS CORONAVIRUS 2 (TAT 6-24 HRS)  URINALYSIS, ROUTINE W REFLEX MICROSCOPIC     EKG None  Radiology No results found.  Procedures Procedures   Medications Ordered in ED Medications  sodium chloride 0.9 % bolus 1,000 mL (has no administration in time range)  HYDROmorphone (DILAUDID) injection 1 mg (has no administration in time range)  ondansetron (ZOFRAN) injection 4 mg (has no administration in time range)    ED Course  I have reviewed the triage vital signs and the nursing notes.  Pertinent labs & imaging results that were available during my care of the patient were reviewed by me and considered in my medical decision making (see chart for details). You notable for hospitalization with discharge 1 week ago following admission for fever.  Evaluation at point was generally reassuring, no obvious source elucidated. Chart review with notes from earlier today suggesting need for MRI for  new shoulder pain.  On repeat exam, however, the patient's shoulder is unremarkable, with range of motion that is essentially unchanged, mild tenderness in the trapezius, however.  CT result from last month and MRI from 2 weeks ago as below:   IMPRESSION: 1. Degraded exam secondary to lack of IV or oral contrast. 2. Distal colitis, which given the clinical history, is most likely due to inflammatory bowel disease. Infection could look similar. 3. Extensive surgical changes of enterotomy and partial colectomy. No bowel obstruction. 4. Left femoral head avascular necrosis. 5.  Emphysema (ICD10-J43.9).  IMPRESSION: Normal lumbar spine MRI. No discitis/osteomyelitis or epidural abscess at the lumbar levels.     10:27 PM Patient awake and alert, some improvement in her pain, but she continued to complain of weakness, anorexia, and initial labs reviewed, discussed, notable for acute kidney injury with creatinine 1.6, almost double most recent value from 5 days ago. Patient was found to have transaminase elevations, in given concern for weakness, nausea, anorexia, acute  kidney injury, she will require admission for ongoing monitoring, fluid resuscitation. Patient's abdomen, though tender but not peritoneal, she has no fever, no leukocytosis, no indication for emergent abdominal CT. No early evidence for bacteremia, sepsis. Though she does have shoulder pain, she has no true limited range of motion, no evidence for septic arthritis.  Some suspicion for musculoskeletal etiology. MDM Rules/Calculators/A&P MDM Number of Diagnoses or Management Options AKI (acute kidney injury) ([place]): new, needed workup Weakness: established, worsening   Amount and/or Complexity of Data Reviewed Clinical lab tests: ordered and reviewed Tests in the medicine section of CPT: reviewed and ordered Decide to obtain previous medical records or to obtain history from someone other than the patient: yes Review and summarize past medical records: yes Discuss the patient with other providers: yes  Risk of Complications, Morbidity, and/or Mortality Presenting problems: high Diagnostic procedures: high Management options: high  Critical Care Total time providing critical care: < 30 minutes  Patient Progress Patient progress: stable   Final Clinical Impression(s) / ED Diagnoses Final diagnoses:  AKI (acute kidney injury) ([place])  Weakness     Carmin Muskrat, MD 05/22/21 2232

## 2021-05-22 NOTE — Progress Notes (Signed)
RFV: follow up for hospitalization  Patient ID: Caitlin Peters, female   DOB: Jun 19, 1961, 60 y.o.   MRN: [id number]  HPI Caitlin Peters is a 60yo F with crohn's disease, hx of thoracic discitis, who was recently admitted for fever and abdominal pain. Infection ruled out and treated for possible flare. Since being discharged she has had bilateral Lower extremity swelling Left shoulder pain still persisting since hospitalization- had plain xray which were unrevealing  Outpatient Encounter Medications as of 05/22/2021  Medication Sig   acetaminophen (TYLENOL) 325 MG tablet Take 650 mg by mouth every 6 (six) hours as needed for mild pain.    amLODipine (NORVASC) 10 MG tablet Take 10 mg by mouth daily.   budesonide (ENTOCORT EC) 3 MG 24 hr capsule Take 3 capsules (9 mg total) by mouth daily.   buPROPion (WELLBUTRIN SR) 100 MG 12 hr tablet Take 200 mg by mouth daily.   Calcium 200 MG TABS Take 200 mg by mouth daily.   carvedilol (COREG) 25 MG tablet Take 1 tablet (25 mg total) by mouth 2 (two) times daily with a meal.   cholecalciferol (VITAMIN D3) 25 MCG (1000 UT) tablet Take 1,000 Units by mouth daily.    cycloSPORINE (RESTASIS) 0.05 % ophthalmic emulsion Place 1 drop into both eyes 2 (two) times daily.   denosumab (PROLIA) 60 MG/ML SOSY injection Inject 60 mg into the skin every 6 (six) months.   dexlansoprazole (DEXILANT) 60 MG capsule Take 1 capsule (60 mg total) by mouth daily.   Dextran 70-Hypromellose 0.1-0.3 % SOLN Place 1 drop into both eyes 4 (four) times daily.   diphenoxylate-atropine (LOMOTIL) 2.5-0.025 MG tablet TAKE 1 TABLET BY MOUTH 4 TIMES DAILY AS NEEDED FOR DIARRHEA OR  LOOSE  STOOLS   DULoxetine (CYMBALTA) 30 MG capsule TAKE 3 CAPSULES BY MOUTH ONCE DAILY   estradiol (ESTRACE) 2 MG tablet Take 1 tablet (2 mg total) by mouth daily.   famotidine (PEPCID) 20 MG tablet Take 20 mg by mouth daily as needed for heartburn or indigestion.   GATTEX 5 MG KIT Inject 5 mLs into the skin daily.    hydrALAZINE (APRESOLINE) 100 MG tablet Take 1 tablet (100 mg total) by mouth 3 (three) times daily.   HYDROmorphone (DILAUDID) 4 MG tablet Take 4 mg by mouth in the morning, at noon, in the evening, and at bedtime.   levETIRAcetam (KEPPRA) 500 MG tablet Take 500 mg by mouth 2 (two) times daily.   lipase/protease/amylase (CREON) 36000 UNITS CPEP capsule Take 1 capsule (36,000 Units total) by mouth 3 (three) times daily as needed (with meals for digestion).   loperamide (IMODIUM) 2 MG capsule TAKE 1 CAPSULE BY MOUTH AS NEEDED FOR DIARRHEA OR LOOSE STOOLS   methadone (DOLOPHINE) 5 MG tablet Take 1 tablet (5 mg total) by mouth 5 (five) times daily.   Multiple Vitamins-Minerals (MULTIVITAMIN ADULT PO) Take 1 tablet by mouth daily.   NARCAN 4 MG/0.1ML LIQD nasal spray kit Place 1 spray into the nose once as needed (overdose).   Probiotic Product (PROBIOTIC-10 PO) Take 1 capsule by mouth daily.    promethazine (PHENERGAN) 25 MG tablet TAKE 1 TABLET BY MOUTH EVERY 6 HOURS AS NEEDED FOR NAUSEA FOR VOMITING   sodium chloride 0.9 % infusion Inject 1 mL into the vein daily as needed (flush).   sucralfate (CARAFATE) 1 GM/10ML suspension Take 10 mLs (1 g total) by mouth 4 (four) times daily -  with meals and at bedtime.   Trace Minerals Cu-Mn-Se-Zn (TRALEMENT  IV) Inject 1 mL into the vein See admin instructions. Used in TPN bag 4 times weekly   vitamin B-12 (CYANOCOBALAMIN) 100 MCG tablet Take 1,000 mcg by mouth daily.    PRESCRIPTION MEDICATION Inject 1 each into the vein daily. Home TPN . [facility] . 1 bag for 12 hours. [phone]   No facility-administered encounter medications on file as of 05/22/2021.     Patient Active Problem List   Diagnosis Date Noted   Elevated LFTs    Sepsis ([place]) 05/06/2021   Acidosis 04/08/2021   Colitis 04/07/2021   Intractable nausea and vomiting 03/11/2021   Thoracic discitis 02/10/2021   Pressure injury of skin 12/18/2020   Bacteremia  12/17/2020   Seizures ([place]) 12/17/2020   Drug-seeking behavior 12/16/2020   Seizure ([place]) 12/15/2020   CKD (chronic kidney disease) stage 3, GFR 30-59 ml/min ([place]) 12/06/2020   COVID-19 virus infection 12/06/2020   Protein calorie malnutrition ([place]) 12/06/2020   Palpitations 11/22/2020   PAC (premature atrial contraction) 11/22/2020   History of central line-associated bloodstream infection ([facility]) 10/21/2020   Nausea & vomiting 10/21/2020   Choledocholithiasis (sludge) s/p ERCP 10/2019 10/21/2020   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   Abdominal pain 96/28/3662   Acute metabolic encephalopathy    Hypomagnesemia    Partial small bowel obstruction ([name])    Bacteremia associated with intravascular line ([place]) 08/04/2020   Enterobacter sepsis ([place]) 07/22/2020   Fever of unknown origin 06/27/2020   Anxiety 06/03/2020   Acute pancreatitis 04/13/2020   Infection due to Acinetobacter species 03/10/2020   Pancytopenia ([place]) 03/05/2020   Central line infection    Elevated liver enzymes 01/02/2020   Cholangitis    Anasarca 10/28/2019   Acute kidney injury superimposed on chronic kidney disease ([place]) 10/28/2019   Falls 10/28/2019   Malnutrition ([place])    Vitamin B12 deficiency    GERD (gastroesophageal reflux disease)    Chronic pain syndrome    Hypokalemia due to excessive gastrointestinal loss of potassium 10/13/2019   Fever    Hypokalemia 10/12/2019   Chronic diarrhea 10/12/2019   Dysuria 10/12/2019   Bilateral lower extremity edema 10/12/2019   AKI (acute kidney injury) ([place]) 10/12/2019   Fungemia 08/27/2019   IDA (iron deficiency anemia) 11/03/2018   Dilation of biliary tract 08/28/2018   Severe diarrhea 03/07/2018   LFTs abnormal 01/27/2018   GI tract obstruction ([place]) 01/27/2018   Gram-negative bacteremia 09/13/2017   HTN (hypertension), benign 12/02/2016   Intractable pain 12/02/2016   Anemia 12/02/2016   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   Avascular necrosis ([place])  06/14/2015   Polyarthralgia 05/03/2015   On total parenteral nutrition (TPN) 12/30/2014   Osteoporosis 12/24/2014   Low back pain 12/16/2014   Vitamin D deficiency 12/16/2014   Short gut syndrome 07/15/2014   History of colonic diverticulitis 2014   Depression 07/24/2012   Gram-positive bacteremia 07/24/2012   Small bowel obstruction due to adhesions ([place]) 07/24/2012   Diarrhea 12/13/2011   Neuralgia and neuritis 06/01/2011   Myalgia and myositis 08/12/2003   Crohn's disease of small & large intestine with SGS 1984     Health Maintenance Due  Topic Date Due   Pneumococcal Vaccine 62-40 Years old (1 - PCV) Never done   Zoster Vaccines- Shingrix (1 of 2) Never done     Review of Systems  Physical Exam   BP (!) 181/84   Pulse 67   Temp 98 F (36.7 C) (Oral)   Wt 144 lb 9.6 oz (  65.6 kg)   BMI 21.99 kg/m    Physical Exam  Constitutional:  oriented to person, place, and time. appears well-developed and well-nourished. No distress.  HENT: [place]/AT, PERRLA, no scleral icterus Mouth/Throat: Oropharynx is clear and moist. No oropharyngeal exudate.  Cardiovascular: Normal rate, regular rhythm and normal heart sounds. Exam reveals no gallop and no friction rub.  No murmur heard.  Pulmonary/Chest: Effort normal and breath sounds normal. No respiratory distress.  has no wheezes.  Neck = supple, no nuchal rigidity Chest wall = prominent vascularture Msk exam = tender to palpation along scapular edge, as well as shoulder joint. Limited range of motion by sharp pain. But no rash to suggest zoster Lymphadenopathy: no cervical adenopathy. No axillary adenopathy Ext: trace to +1 edema of lower extremities Psychiatric: a normal mood and affect.  behavior is normal.    CBC Lab Results  Component Value Date   WBC 3.4 (L) 05/15/2021   RBC 2.80 (L) 05/15/2021   HGB 8.5 (L) 05/15/2021   HCT 26.2 (L) 05/15/2021   PLT 180 05/15/2021   MCV 93.6 05/15/2021   MCH 30.4 05/15/2021   MCHC 32.4  05/15/2021   RDW 14.7 05/15/2021   LYMPHSABS 1.9 05/15/2021   MONOABS 0.2 05/15/2021   EOSABS 0.1 05/15/2021    BMET Lab Results  Component Value Date   NA 137 05/16/2021   K 3.7 05/16/2021   CL 109 05/16/2021   CO2 25 05/16/2021   GLUCOSE 120 (H) 05/16/2021   BUN 15 05/16/2021   CREATININE 0.99 05/16/2021   CALCIUM 8.2 (L) 05/16/2021   GFRNONAA >60 05/16/2021   GFRAA 55 (L) 08/12/2020      Assessment and Plan Left shoulder pain = concern for possible rotator cuff tear but does not explain her exquisite pain to scapular region. No hx of trauma Will do mri of left shoulder  Lower extremity swelling = likely from fluid overload in the hospital. Anticipate to improve. Since she has hx of having ostomy, and malabsorption, I am hesistant to give a few days of diuretics since it will likely cause too much electrolyte imbalance. If LE swelling not improved in a few days, recommend to follow up with pcp  Discitis = previously treated, currently receives opiates for chronic pain.

## 2021-05-22 NOTE — H&P (Signed)
History and Physical    Caitlin Peters MRN:[id number] DOB: 09/13/1961 DOA: 05/22/2021  PCP: Hernandez Acosta, Estela Y, MD Patient coming from: Home  Chief Complaint: Multiple complaints  HPI: Caitlin Peters is a 60 y.o. 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  Recently admitted on 05/06/2021 for fever, abdominal pain, AKI, and elevated liver enzymes.  Liver ultrasound was unrevealing.  Elevated liver enzymes were felt to be due to TPN.  Initially treated with broad-spectrum antibiotics which were discontinued by ID as there was no clear source of infection.  LFTs improved and TPN was restarted on 6/19.  Discharged on 05/16/2021.  She had a follow-up visit with infectious disease today and complained of bilateral leg swelling and persistent left shoulder pain.  X-ray was unrevealing.  Sent to the ED for further evaluation and shoulder MRI.   In the ED, patient complained of weakness, nausea, anorexia, diffuse pain, and focal discomfort in her left shoulder.  No fever or signs of sepsis.  Labs showing WBC 5.5, hemoglobin 9.9 (stable), platelet count 225K.  Sodium 138, potassium 4.5, chloride 107, bicarb 23, BUN 36, creatinine 1.6 (was 0.9 on labs done 6 days ago), glucose 75.  Albumin 2.9.  AST 54, ALT 127, alk phos 199, T bili 0.6.  No significant change in LFTs compared to labs done during recent hospitalization.  UA pending.  SARS-CoV-2 PCR test pending. ED PA spoke to Dr. Snider from infectious disease who recommended MRI of shoulder only and did not feel that C-spine MRI was necessary.  Patient was taken for shoulder MRI but exam had to be aborted and she could not tolerate laying down due to pain.  She was given Dilaudid, Zofran, and 1 L normal saline bolus.  Patient states she went to see her infectious disease  doctor because she has had ongoing pain in her left shoulder since was in the hospital.  She is also having some muscle spasms in her neck on the left for which she has been rubbing icy hot and it helps.  She is not having any difficulty moving her neck.  States while she was waiting for the bus to go to infectious disease office, she could not control her urine and peed.  She had a fever of 100 F a few days ago just after she left the hospital.  Denies dysuria.  Reports ongoing generalized abdominal pain since January due to which she is not eating much and losing weight.  Also reports chronic diarrhea.  She is feeling nauseous but has not vomited.  She is reporting severe abdominal pain and requesting IV pain medications.  Reporting progressively worsening bilateral lower extremity edema x1 month.  Denies cough or shortness of breath.  No other complaints.  Per chart review: Abdominal ultrasound on 05/10/2021 was unrevealing. Lumbar spine MRI done 05/08/2021 was normal without evidence of discitis/osteomyelitis or epidural abscess. Thoracic spine MRI done 05/08/2021 showing decreased findings of discitis-osteomyelitis at the T3-4 level.  No spinal canal or neural foraminal stenosis.  Review of Systems:  All systems reviewed and apart from history of presenting illness, are negative.  Past Medical History:  Diagnosis Date   Acute pancreatitis 04/13/2020   Anasarca 10/2019   AVN (avascular necrosis of bone) ([name])    Cataract    Choledocholithiasis (sludge) s/p ERCP 10/2019 10/21/2020   Chronic pain syndrome      CKD (chronic kidney disease), stage III ([name])    Crohn disease ([name])    Crohn's disease of small & large intestine with SGS 1984   Caitlin Peters is a 60 year old female with a history of Crohn's disease diagnosed in 1984 (age 22), history of long-term steroid use with osteoporosis, S/P multiple bowel resections (2000-2009) complicated by chronic back and abdominal pain, steatorrhea and  short bowel syndrome. The patient has been left with ~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 attached to proximal transverse colon through rectum. She has been   Depression    Diverticulosis    GERD (gastroesophageal reflux disease)    HTN (hypertension)    IDA (iron deficiency anemia)    Malnutrition ([name])    Mass in chest    Osteoporosis    Osteoporosis 12/24/2014   Pancreatitis    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; 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; jejunal to transverse colon anastomosis Treated at [facility] - STEP SB lengthening to 165cm Dr Osman, [facility]  ESTABLISHED AT [facility]   Vitamin B12 deficiency     Past Surgical History:  Procedure Laterality Date   ABDOMINAL ADHESION SURGERY  01/22/2018   APPENDECTOMY  1989   BILIARY DILATION  11/26/2019   Procedure: BILIARY DILATION;  Surgeon: Gupta, Rajesh, MD;  Location: [facility];  Service: Endoscopy;;   BILIARY DILATION  03/08/2020   Procedure: BILIARY DILATION;  Surgeon: Mansouraty, Gabriel Jr., MD;  Location: [facility];  Service: Gastroenterology;;   BIOPSY  03/08/2020   Procedure: BIOPSY;  Surgeon: Mansouraty, Gabriel Jr., MD;  Location: [facility];  Service: Gastroenterology;;   CHEST WALL RESECTION     right thoracotomy,resection of chest mass with anterior rib and reconstruction using prosthetic mesh and video arthroscopy   CHOLECYSTECTOMY  01/22/2018   COLONOSCOPY  2019   ENTEROSTOMY CLOSURE  04/1999   ERCP N/A 11/26/2019   Procedure: ENDOSCOPIC RETROGRADE CHOLANGIOPANCREATOGRAPHY (ERCP);  Surgeon: Gupta, Rajesh, MD;  Location: [facility];  Service: Endoscopy;  Laterality: N/A;   ERCP N/A 03/08/2020   Procedure: ENDOSCOPIC RETROGRADE CHOLANGIOPANCREATOGRAPHY (ERCP);  Surgeon: Mansouraty, Gabriel Jr., MD;  Location: [facility];  Service: Gastroenterology;  Laterality: N/A;   ESOPHAGOGASTRODUODENOSCOPY N/A 03/08/2020   Procedure:  ESOPHAGOGASTRODUODENOSCOPY (EGD);  Surgeon: Mansouraty, Gabriel Jr., MD;  Location: [facility];  Service: Gastroenterology;  Laterality: N/A;   EUS N/A 03/08/2020   Procedure: UPPER ENDOSCOPIC ULTRASOUND (EUS) LINEAR;  Surgeon: Mansouraty, Gabriel Jr., MD;  Location: [facility];  Service: Gastroenterology;  Laterality: N/A;   ILEOCECETOMY  03/1999   ileocolon resection with abdominal stoma   ILEOSTOMY CLOSURE  2001   IR FLUORO GUIDE CV LINE LEFT  01/07/2020   IR FLUORO GUIDE CV LINE LEFT  03/09/2020   IR FLUORO GUIDE CV LINE LEFT  05/09/2020   IR FLUORO GUIDE CV LINE LEFT  07/20/2020   IR FLUORO GUIDE CV LINE RIGHT  08/05/2020   IR FLUORO GUIDE CV LINE RIGHT  04/03/2021   IR PTA VENOUS EXCEPT DIALYSIS CIRCUIT  01/07/2020   IR REMOVAL TUN CV CATH W/O FL  08/05/2020   IR US GUIDE VASC ACCESS LEFT     x 2 06/17/19 and 09/14/2019   IR US GUIDE VASC ACCESS RIGHT  08/05/2020   KNEE SURGERY     right knee    LAPAROSCOPIC SMALL BOWEL RESECTION  2009   2000-2009.  SB resections for Crohns Disease - now with Short gut     OMENTECTOMY  01/22/2018   PARTIAL HYSTERECTOMY  1984   with LSO   REMOVAL OF STONES  11/26/2019   Procedure: REMOVAL OF STONES;  Surgeon: Jackquline Denmark, MD;  Location: [facility];  Service: Endoscopy;;   REMOVAL OF STONES  03/08/2020   Procedure: REMOVAL OF STONES;  Surgeon: Irving Copas., MD;  Location: [facility] ENDOSCOPY;  Service: Gastroenterology;;   SALPINGOOPHORECTOMY Left 1984   SALPINGOOPHORECTOMY Right 1990   SERIAL TRANSVERSE ENTEROPLASTY (STEP) - SMALL BOWEL LENGTHENING  01/22/2018   Dr Alene Mires, [facility] - SB length from 120 to 165cm    SMALL INTESTINE SURGERY  2002   SMALL INTESTINE SURGERY  2003   SPHINCTEROTOMY  11/26/2019   Procedure: SPHINCTEROTOMY;  Surgeon: Jackquline Denmark, MD;  Location: [facility];  Service: Endoscopy;;   TOTAL ABDOMINAL HYSTERECTOMY  1990   with RSO   UPPER GASTROINTESTINAL ENDOSCOPY       reports that she has quit smoking.  She has never used smokeless tobacco. She reports previous alcohol use. She reports that she does not use drugs.  Allergies  Allergen Reactions   Meperidine Hives    Other reaction(s): GI Upset Due to Chrones    Hyoscyamine Hives and Swelling    Legs swelling   Disorientation   Cefepime Other (See Comments)    Neurotoxicity occurring in setting of AKI. Ceftriaxone tolerated during same admit   Gabapentin Other (See Comments)    unknown   Lyrica [Pregabalin] Other (See Comments)    unknown   Topamax [Topiramate] Other (See Comments)    unknown   Zosyn [Piperacillin Sod-Tazobactam So]     Patient reports it makes her vomit, her neck stiff, and her "heart feel funny"   Fentanyl Rash    Pt is allergic to fentanyl patch related to the glue (gives her a rash) Pt states she is NOT allergic to fentanyl IV medicine   Morphine And Related Rash    Family History  Problem Relation Age of Onset   Breast cancer Sister    Multiple sclerosis Sister    Diabetes Sister    Lupus Sister    Colon cancer Other    Crohn's disease Other    Seizures Mother    Glaucoma Mother    CAD Father    Heart disease Father    Hypertension Father     Prior to Admission medications   Medication Sig Start Date End Date Taking? Authorizing Provider  acetaminophen (TYLENOL) 325 MG tablet Take 650 mg by mouth every 6 (six) hours as needed for mild pain.    Yes [provider]  amLODipine (NORVASC) 10 MG tablet Take 10 mg by mouth daily.   Yes [provider]  budesonide (ENTOCORT EC) 3 MG 24 hr capsule Take 3 capsules (9 mg total) by mouth daily. 03/06/21  Yes [name], [name], MD  buPROPion [facility] SR) 100 MG 12 hr tablet Take 200 mg by mouth daily. 03/02/21  Yes [provider]  Calcium 200 MG TABS Take 200 mg by mouth daily.   Yes [provider]  carvedilol (COREG) 25 MG tablet Take 1 tablet (25 mg total) by mouth 2 (two) times daily with a meal. 05/16/21  06/15/21 Yes Pahwani, Einar Grad, MD  cholecalciferol (VITAMIN D3) 25 MCG (1000 UT) tablet Take 1,000 Units by mouth daily.    Yes [provider]  cycloSPORINE (RESTASIS) 0.05 % ophthalmic emulsion Place 1 drop into both eyes 2 (two) times daily.   Yes [provider]  denosumab (PROLIA) 60 MG/ML SOSY injection Inject 60 mg into the skin every 6 (six) months.   Yes [provider]  dexlansoprazole (DEXILANT) 60 MG capsule Take 1 capsule (60 mg total) by mouth daily. 12/28/20  Yes Hernandez Acosta, Estela Y, MD  Dextran 70-Hypromellose 0.1-0.3 % SOLN Place 1 drop into both eyes 4 (four) times daily.   Yes [provider]  diphenoxylate-atropine (LOMOTIL) 2.5-0.025 MG tablet TAKE 1 TABLET BY MOUTH 4 TIMES DAILY AS NEEDED FOR DIARRHEA OR  LOOSE  STOOLS Patient taking differently: Take 1 tablet by mouth 4 (four) times daily as needed for diarrhea or loose stools. 12/28/20  Yes Hernandez Acosta, Estela Y, MD  DULoxetine (CYMBALTA) 30 MG capsule TAKE 3 CAPSULES BY MOUTH ONCE DAILY Patient taking differently: Take 90 mg by mouth daily. 03/22/21  Yes Hernandez Acosta, Estela Y, MD  estradiol (ESTRACE) 2 MG tablet Take 1 tablet (2 mg total) by mouth daily. 12/07/20  Yes Hernandez Acosta, Estela Y, MD  famotidine (PEPCID) 20 MG tablet Take 20 mg by mouth daily as needed for heartburn or indigestion.   Yes [provider]  GATTEX 5 MG KIT Inject 5 mLs into the skin daily. 01/03/21  Yes [provider]  hydrALAZINE (APRESOLINE) 100 MG tablet Take 1 tablet (100 mg total) by mouth 3 (three) times daily. 05/16/21 06/15/21 Yes Pahwani, Ravi, MD  HYDROmorphone (DILAUDID) 4 MG tablet Take 4 mg by mouth in the morning, at noon, in the evening, and at bedtime. 04/18/21  Yes [provider]  levETIRAcetam (KEPPRA) 500 MG tablet Take 500 mg by mouth 2 (two) times daily.   Yes [provider]  lipase/protease/amylase (CREON) 36000 UNITS CPEP capsule Take 1 capsule  (36,000 Units total) by mouth 3 (three) times daily as needed (with meals for digestion). Patient taking differently: Take 36,000 Units by mouth with breakfast, with lunch, and with evening meal. 06/28/20  Yes Hernandez Acosta, Estela Y, MD  loperamide (IMODIUM) 2 MG capsule TAKE 1 CAPSULE BY MOUTH AS NEEDED FOR DIARRHEA OR LOOSE STOOLS Patient taking differently: Take 2 mg by mouth as needed for diarrhea or loose stools. 01/20/21  Yes Hernandez Acosta, Estela Y, MD  Menthol, Topical Analgesic, (ICY HOT BACK EX) Apply 1 application topically daily as needed (back pain).   Yes [provider]  methadone (DOLOPHINE) 5 MG tablet Take 1 tablet (5 mg total) by mouth 5 (five) times daily. Patient taking differently: Take 5 mg by mouth 4 (four) times daily as needed for moderate pain. 06/30/20  Yes Xu, Fang, MD  Multiple Vitamins-Minerals (MULTIVITAMIN ADULT PO) Take 1 tablet by mouth daily.   Yes [provider]  NARCAN 4 MG/0.1ML LIQD nasal spray kit Place 1 spray into the nose once as needed (overdose). 10/14/20  Yes [provider]  PRESCRIPTION MEDICATION Inject 1 each into the vein daily. Home TPN . [facility] in [place] . 1 bag for 12 hours. [phone]   Yes [provider]  Probiotic Product (PROBIOTIC-10 PO) Take 1 capsule by mouth daily.    Yes [provider]  promethazine (PHENERGAN) 25 MG tablet TAKE 1 TABLET BY MOUTH EVERY 6 HOURS AS NEEDED FOR NAUSEA FOR VOMITING Patient taking differently: Take 25 mg by mouth every 6 (six) hours as needed for nausea or vomiting. 04/21/21  Yes Hernandez Acosta, Estela Y, MD  sodium chloride 0.9 % infusion Inject 1 mL into the vein daily as needed (flush). 10/16/20    Yes [provider]  sucralfate (CARAFATE) 1 GM/10ML suspension Take 10 mLs (1 g total) by mouth 4 (four) times daily -  with meals and at bedtime. 06/28/20  Yes [name], [name], MD  Trace Minerals Cu-Mn-Se-Zn (TRALEMENT  IV) Inject 1 mL into the vein See admin instructions. Used in TPN bag 4 times weekly   Yes [provider]  vitamin B-12 (CYANOCOBALAMIN) 1000 MCG tablet Take 1,000 mcg by mouth daily.    Yes [provider]    Physical Exam: Vitals:   05/22/21 1451 05/22/21 1758  BP: 117/60 (!) 151/87  Pulse: 69 76  Resp: 20 20  Temp: 98.2 F (36.8 C)   SpO2: 100% 98%    Physical Exam Constitutional:      General: She is not in acute distress. HENT:     Head: Normocephalic and atraumatic.  Eyes:     Extraocular Movements: Extraocular movements intact.     Conjunctiva/sclera: Conjunctivae normal.  Cardiovascular:     Rate and Rhythm: Normal rate and regular rhythm.     Pulses: Normal pulses.  Pulmonary:     Effort: Pulmonary effort is normal. No respiratory distress.     Breath sounds: Normal breath sounds. No wheezing or rales.  Abdominal:     General: Bowel sounds are normal. There is no distension.     Palpations: Abdomen is soft.     Tenderness: There is abdominal tenderness. There is guarding. There is no rebound.     Comments: Generalized tenderness to palpation with guarding  Musculoskeletal:     Cervical back: Normal range of motion and neck supple.     Comments: +1 pedal edema bilaterally  Skin:    General: Skin is warm and dry.  Neurological:     Mental Status: She is alert and oriented to person, place, and time.     Sensory: No sensory deficit.     Motor: No weakness.     Labs on Admission: I have personally reviewed following labs and imaging studies  CBC: Recent Labs  Lab 05/22/21 1515  WBC 5.5  NEUTROABS 2.9  HGB 9.9*  HCT 31.2*  MCV 96.0  PLT 017   Basic Metabolic Panel: Recent Labs  Lab 05/16/21 0348 05/22/21 1515  NA 137 138  K 3.7 4.5  CL 109 107  CO2 25 23  GLUCOSE 120* 75  BUN 15 36*  CREATININE 0.99 1.63*  CALCIUM 8.2* 8.7*  MG 1.9  --   PHOS 2.8  --    GFR: Estimated Creatinine Clearance: 37 mL/min (A) (by C-G formula  based on SCr of 1.63 mg/dL (H)). Liver Function Tests: Recent Labs  Lab 05/22/21 1515  AST 54*  ALT 127*  ALKPHOS 199*  BILITOT 0.6  PROT 6.8  ALBUMIN 2.9*   No results for input(s): LIPASE, AMYLASE in the last 168 hours. No results for input(s): AMMONIA in the last 168 hours. Coagulation Profile: No results for input(s): INR, PROTIME in the last 168 hours. Cardiac Enzymes: No results for input(s): CKTOTAL, CKMB, CKMBINDEX, TROPONINI in the last 168 hours. BNP (last 3 results) No results for input(s): PROBNP in the last 8760 hours. HbA1C: No results for input(s): HGBA1C in the last 72 hours. CBG: Recent Labs  Lab 05/16/21 0633 05/16/21 1150  GLUCAP 108* 111*   Lipid Profile: No results for input(s): CHOL, HDL, LDLCALC, TRIG, CHOLHDL, LDLDIRECT in the last 72 hours. Thyroid Function Tests: No results for input(s): TSH, T4TOTAL, FREET4, T3FREE, THYROIDAB in  the last 72 hours. Anemia Panel: No results for input(s): VITAMINB12, FOLATE, FERRITIN, TIBC, IRON, RETICCTPCT in the last 72 hours. Urine analysis:    Component Value Date/Time   COLORURINE YELLOW 05/06/2021 2026   APPEARANCEUR CLEAR 05/06/2021 2026   LABSPEC 1.014 05/06/2021 2026   PHURINE 8.0 05/06/2021 2026   GLUCOSEU NEGATIVE 05/06/2021 2026   HGBUR NEGATIVE 05/06/2021 2026   BILIRUBINUR NEGATIVE 05/06/2021 2026   KETONESUR NEGATIVE 05/06/2021 2026   PROTEINUR >=300 (A) 05/06/2021 2026   NITRITE NEGATIVE 05/06/2021 2026   LEUKOCYTESUR NEGATIVE 05/06/2021 2026    Radiological Exams on Admission: No results found.  Assessment/Plan Principal Problem:   Abdominal pain Active Problems:   Crohn's disease of small & large intestine with SGS   AKI (acute kidney injury) ([name])   LFTs abnormal   Left shoulder pain  History of Crohn's, short gut syndrome, pancreatic insufficiency, chronic diarrhea, chronic abdominal pain, malnutrition Presenting with complaints of nausea, diarrhea, and generalized abdominal  pain which is worse from her baseline.  Has generalized tenderness to palpation on exam with guarding.  No fever or leukocytosis. -IV Dilaudid as needed for pain, IV fluid hydration, IV Phenergan as needed for nausea.  Continue TPN.  Continue home medications - Creon, budesonide, and Gattex.  CT abdomen pelvis ordered.  Left shoulder pain -MRI done, results pending  Neck pain Complaining of left-sided neck muscle spasms for which over-the-counter muscle rub is helping.  Range of motion normal.  She had an episode of urinary incontinence today while she was waiting for a bus and did not have access to a toilet.  No upper or lower extremity weakness.  No sensory deficit. -ED PA spoke to Dr. Baxter Flattery who did not feel that C-spine MRI was necessary.  AKI on CKD stage II-IIIa Likely prerenal from dehydration/poor oral intake.  BUN 36.  Creatinine 1.6, was 0.9 on labs done 6 days ago. -IV fluid hydration, repeat BMP in a.m. Avoid nephrotoxic agents.  Elevated liver enzymes Likely due to TPN use.  No significant change in LFTs compared to recent labs. Abdominal ultrasound on 05/10/2021 was unrevealing. -Continue to monitor LFTs  Mild bilateral pedal edema No obvious clinical signs of DVT. Lungs clear on exam.  Echo done in January 2022 with normal EF. -Check D-dimer level  Hypertension -Continue Coreg, hydralazine, amlodipine  GERD -Continue PPI, sucralfate  History of seizures? -Continue Keppra  Anxiety and depression -Continue bupropion, duloxetine  DVT prophylaxis: Lovenox Code Status: Patient wishes to be full code. Family Communication: No family available at this time. Disposition Plan: Status is: Observation  The patient remains OBS appropriate and will d/c before 2 midnights.  Dispo: The patient is from: Home              Anticipated d/c is to: Home              Patient currently is not medically stable to d/c.   Difficult to place patient No  Level of care: Level of care:  Med-Surg  The medical decision making on this patient was of high complexity and the patient is at high risk for clinical deterioration, therefore this is a level 3 visit.  Shela Leff MD [facility]  If 7PM-7AM, please contact night-coverage [email or web address]  05/23/2021, 1:38 AM

## 2021-05-23 ENCOUNTER — Observation Stay (HOSPITAL_COMMUNITY): Payer: Medicare Other

## 2021-05-23 DIAGNOSIS — R7401 Elevation of levels of liver transaminase levels: Secondary | ICD-10-CM | POA: Diagnosis present

## 2021-05-23 DIAGNOSIS — Z79899 Other long term (current) drug therapy: Secondary | ICD-10-CM | POA: Diagnosis not present

## 2021-05-23 DIAGNOSIS — K912 Postsurgical malabsorption, not elsewhere classified: Secondary | ICD-10-CM

## 2021-05-23 DIAGNOSIS — R5381 Other malaise: Secondary | ICD-10-CM | POA: Diagnosis present

## 2021-05-23 DIAGNOSIS — R112 Nausea with vomiting, unspecified: Secondary | ICD-10-CM

## 2021-05-23 DIAGNOSIS — Z888 Allergy status to other drugs, medicaments and biological substances status: Secondary | ICD-10-CM | POA: Diagnosis not present

## 2021-05-23 DIAGNOSIS — R531 Weakness: Secondary | ICD-10-CM | POA: Diagnosis present

## 2021-05-23 DIAGNOSIS — I129 Hypertensive chronic kidney disease with stage 1 through stage 4 chronic kidney disease, or unspecified chronic kidney disease: Secondary | ICD-10-CM | POA: Diagnosis present

## 2021-05-23 DIAGNOSIS — F32A Depression, unspecified: Secondary | ICD-10-CM | POA: Diagnosis present

## 2021-05-23 DIAGNOSIS — R945 Abnormal results of liver function studies: Secondary | ICD-10-CM | POA: Diagnosis not present

## 2021-05-23 DIAGNOSIS — K219 Gastro-esophageal reflux disease without esophagitis: Secondary | ICD-10-CM | POA: Diagnosis present

## 2021-05-23 DIAGNOSIS — M810 Age-related osteoporosis without current pathological fracture: Secondary | ICD-10-CM | POA: Diagnosis present

## 2021-05-23 DIAGNOSIS — D638 Anemia in other chronic diseases classified elsewhere: Secondary | ICD-10-CM | POA: Diagnosis present

## 2021-05-23 DIAGNOSIS — G894 Chronic pain syndrome: Secondary | ICD-10-CM | POA: Diagnosis present

## 2021-05-23 DIAGNOSIS — R933 Abnormal findings on diagnostic imaging of other parts of digestive tract: Secondary | ICD-10-CM

## 2021-05-23 DIAGNOSIS — R1010 Upper abdominal pain, unspecified: Secondary | ICD-10-CM | POA: Diagnosis not present

## 2021-05-23 DIAGNOSIS — R109 Unspecified abdominal pain: Secondary | ICD-10-CM | POA: Diagnosis not present

## 2021-05-23 DIAGNOSIS — Z87891 Personal history of nicotine dependence: Secondary | ICD-10-CM | POA: Diagnosis not present

## 2021-05-23 DIAGNOSIS — Z20822 Contact with and (suspected) exposure to covid-19: Secondary | ICD-10-CM | POA: Diagnosis present

## 2021-05-23 DIAGNOSIS — E860 Dehydration: Secondary | ICD-10-CM | POA: Diagnosis present

## 2021-05-23 DIAGNOSIS — M25512 Pain in left shoulder: Secondary | ICD-10-CM | POA: Diagnosis present

## 2021-05-23 DIAGNOSIS — M4644 Discitis, unspecified, thoracic region: Secondary | ICD-10-CM | POA: Diagnosis present

## 2021-05-23 DIAGNOSIS — R197 Diarrhea, unspecified: Secondary | ICD-10-CM | POA: Diagnosis not present

## 2021-05-23 DIAGNOSIS — N179 Acute kidney failure, unspecified: Secondary | ICD-10-CM | POA: Diagnosis present

## 2021-05-23 DIAGNOSIS — Z820 Family history of epilepsy and other diseases of the nervous system: Secondary | ICD-10-CM | POA: Diagnosis not present

## 2021-05-23 DIAGNOSIS — K5904 Chronic idiopathic constipation: Secondary | ICD-10-CM | POA: Diagnosis present

## 2021-05-23 DIAGNOSIS — Z9049 Acquired absence of other specified parts of digestive tract: Secondary | ICD-10-CM | POA: Diagnosis not present

## 2021-05-23 DIAGNOSIS — K50819 Crohn's disease of both small and large intestine with unspecified complications: Secondary | ICD-10-CM

## 2021-05-23 DIAGNOSIS — Z885 Allergy status to narcotic agent status: Secondary | ICD-10-CM | POA: Diagnosis not present

## 2021-05-23 DIAGNOSIS — Z8249 Family history of ischemic heart disease and other diseases of the circulatory system: Secondary | ICD-10-CM | POA: Diagnosis not present

## 2021-05-23 DIAGNOSIS — M4624 Osteomyelitis of vertebra, thoracic region: Secondary | ICD-10-CM | POA: Diagnosis present

## 2021-05-23 DIAGNOSIS — N1831 Chronic kidney disease, stage 3a: Secondary | ICD-10-CM | POA: Diagnosis present

## 2021-05-23 LAB — COMPREHENSIVE METABOLIC PANEL
ALT: 93 U/L — ABNORMAL HIGH (ref 0–44)
AST: 33 U/L (ref 15–41)
Albumin: 2.3 g/dL — ABNORMAL LOW (ref 3.5–5.0)
Alkaline Phosphatase: 143 U/L — ABNORMAL HIGH (ref 38–126)
Anion gap: 9 (ref 5–15)
BUN: 35 mg/dL — ABNORMAL HIGH (ref 6–20)
CO2: 21 mmol/L — ABNORMAL LOW (ref 22–32)
Calcium: 8.1 mg/dL — ABNORMAL LOW (ref 8.9–10.3)
Chloride: 109 mmol/L (ref 98–111)
Creatinine, Ser: 1.47 mg/dL — ABNORMAL HIGH (ref 0.44–1.00)
GFR, Estimated: 41 mL/min — ABNORMAL LOW (ref >60)
Glucose, Bld: 47 mg/dL — ABNORMAL LOW (ref 70–99)
Potassium: 4 mmol/L (ref 3.5–5.1)
Sodium: 139 mmol/L (ref 135–145)
Total Bilirubin: 0.9 mg/dL (ref 0.3–1.2)
Total Protein: 5.5 g/dL — ABNORMAL LOW (ref 6.5–8.1)

## 2021-05-23 LAB — URINALYSIS, ROUTINE W REFLEX MICROSCOPIC
Bacteria, UA: NONE SEEN
Bilirubin Urine: NEGATIVE
Glucose, UA: NEGATIVE mg/dL
Hgb urine dipstick: NEGATIVE
Ketones, ur: 80 mg/dL — AB
Leukocytes,Ua: NEGATIVE
Nitrite: NEGATIVE
Protein, ur: 100 mg/dL — AB
Specific Gravity, Urine: 1.015 (ref 1.005–1.030)
pH: 5 (ref 5.0–8.0)

## 2021-05-23 LAB — GLUCOSE, CAPILLARY: Glucose-Capillary: 204 mg/dL — ABNORMAL HIGH (ref 70–99)

## 2021-05-23 LAB — C DIFFICILE QUICK SCREEN W PCR REFLEX
C Diff antigen: NEGATIVE
C Diff interpretation: NOT DETECTED
C Diff toxin: NEGATIVE

## 2021-05-23 LAB — D-DIMER, QUANTITATIVE: D-Dimer, Quant: 0.94 ug/mL-FEU — ABNORMAL HIGH (ref 0.00–0.50)

## 2021-05-23 LAB — SARS CORONAVIRUS 2 (TAT 6-24 HRS): SARS Coronavirus 2: NEGATIVE

## 2021-05-23 MED ORDER — HYDRALAZINE HCL 20 MG/ML IJ SOLN: 10.0000 mg | Freq: Four times a day (QID) | INTRAMUSCULAR | Status: DC | PRN

## 2021-05-23 MED ORDER — HYDRALAZINE HCL 25 MG PO TABS
100.0000 mg | ORAL_TABLET | Freq: Three times a day (TID) | ORAL | Status: DC
  Administered 2021-05-23 – 2021-05-25 (×5): 100 mg via ORAL
  Filled 2021-05-23 (×6): qty 4

## 2021-05-23 MED ORDER — ENOXAPARIN SODIUM 40 MG/0.4ML IJ SOSY
40.0000 mg | PREFILLED_SYRINGE | INTRAMUSCULAR | Status: DC
  Administered 2021-05-23 – 2021-05-25 (×3): 40 mg via SUBCUTANEOUS
  Filled 2021-05-23 (×3): qty 0.4

## 2021-05-23 MED ORDER — TRACE MINERALS CU-MN-SE-ZN 300-55-60-3000 MCG/ML IV SOLN
INTRAVENOUS | Status: AC
  Filled 2021-05-23: qty 640

## 2021-05-23 MED ORDER — POLYVINYL ALCOHOL 1.4 % OP SOLN
1.0000 [drp] | Freq: Four times a day (QID) | OPHTHALMIC | Status: DC
  Administered 2021-05-23 – 2021-05-25 (×11): 1 [drp] via OPHTHALMIC
  Filled 2021-05-23: qty 15

## 2021-05-23 MED ORDER — LEVETIRACETAM IN NACL 500 MG/100ML IV SOLN
500.0000 mg | Freq: Two times a day (BID) | INTRAVENOUS | Status: DC
  Administered 2021-05-23 – 2021-05-24 (×3): 500 mg via INTRAVENOUS
  Filled 2021-05-23 (×3): qty 100

## 2021-05-23 MED ORDER — LEVETIRACETAM 500 MG PO TABS
500.0000 mg | ORAL_TABLET | Freq: Two times a day (BID) | ORAL | Status: DC
  Administered 2021-05-23: 500 mg via ORAL
  Filled 2021-05-23 (×2): qty 1

## 2021-05-23 MED ORDER — ALTEPLASE 2 MG IJ SOLR
2.0000 mg | Freq: Once | INTRAMUSCULAR | Status: AC
  Administered 2021-05-23: 2 mg
  Filled 2021-05-23: qty 2

## 2021-05-23 MED ORDER — HYDROMORPHONE HCL 1 MG/ML IJ SOLN
1.0000 mg | INTRAMUSCULAR | Status: DC | PRN
Start: 2021-05-23 — End: 2021-05-23
  Administered 2021-05-23 (×2): 1 mg via INTRAVENOUS
  Filled 2021-05-23 (×2): qty 1

## 2021-05-23 MED ORDER — SODIUM CHLORIDE 0.9 % IV SOLN: INTRAVENOUS | Status: AC

## 2021-05-23 MED ORDER — AMLODIPINE BESYLATE 10 MG PO TABS
10.0000 mg | ORAL_TABLET | Freq: Every day | ORAL | Status: DC
  Administered 2021-05-25: 10 mg via ORAL
  Filled 2021-05-23 (×2): qty 1

## 2021-05-23 MED ORDER — CARVEDILOL 25 MG PO TABS
25.0000 mg | ORAL_TABLET | Freq: Two times a day (BID) | ORAL | Status: DC
  Administered 2021-05-24 – 2021-05-25 (×3): 25 mg via ORAL
  Filled 2021-05-23 (×4): qty 1

## 2021-05-23 MED ORDER — HYDROMORPHONE HCL 1 MG/ML IJ SOLN
1.0000 mg | INTRAMUSCULAR | Status: DC | PRN
Start: 2021-05-23 — End: 2021-05-24
  Administered 2021-05-23: 1 mg via INTRAVENOUS
  Administered 2021-05-23 – 2021-05-24 (×10): 2 mg via INTRAVENOUS
  Filled 2021-05-23 (×4): qty 2
  Filled 2021-05-23: qty 1
  Filled 2021-05-23 (×6): qty 2

## 2021-05-23 MED ORDER — IOHEXOL 9 MG/ML PO SOLN
ORAL | Status: AC
  Filled 2021-05-23: qty 1000

## 2021-05-23 MED ORDER — ESTRADIOL 2 MG PO TABS: 2.0000 mg | ORAL_TABLET | Freq: Every day | ORAL | Status: DC

## 2021-05-23 MED ORDER — HYDROMORPHONE HCL 2 MG PO TABS
4.0000 mg | ORAL_TABLET | Freq: Four times a day (QID) | ORAL | Status: DC | PRN
  Filled 2021-05-23: qty 2

## 2021-05-23 MED ORDER — SUCRALFATE 1 GM/10ML PO SUSP
1.0000 g | Freq: Three times a day (TID) | ORAL | Status: DC
  Administered 2021-05-23 – 2021-05-25 (×7): 1 g via ORAL
  Filled 2021-05-23 (×8): qty 10

## 2021-05-23 MED ORDER — PANTOPRAZOLE SODIUM 40 MG IV SOLR: 40.0000 mg | INTRAVENOUS | Status: DC

## 2021-05-23 MED ORDER — SODIUM CHLORIDE 0.9 % IV SOLN
25.0000 mg | Freq: Four times a day (QID) | INTRAVENOUS | Status: DC | PRN
  Administered 2021-05-23 – 2021-05-24 (×4): 25 mg via INTRAVENOUS
  Filled 2021-05-23 (×8): qty 1

## 2021-05-23 MED ORDER — METHADONE HCL 10 MG PO TABS
5.0000 mg | ORAL_TABLET | Freq: Every day | ORAL | Status: DC
  Administered 2021-05-23 – 2021-05-25 (×4): 5 mg via ORAL
  Filled 2021-05-23 (×7): qty 1

## 2021-05-23 MED ORDER — DULOXETINE HCL 60 MG PO CPEP
90.0000 mg | ORAL_CAPSULE | Freq: Every day | ORAL | Status: DC
  Administered 2021-05-25: 90 mg via ORAL
  Filled 2021-05-23 (×2): qty 1

## 2021-05-23 MED ORDER — BUDESONIDE 3 MG PO CPEP
9.0000 mg | ORAL_CAPSULE | Freq: Every day | ORAL | Status: DC
  Administered 2021-05-25: 9 mg via ORAL
  Filled 2021-05-23 (×3): qty 3

## 2021-05-23 MED ORDER — BUPROPION HCL ER (SR) 100 MG PO TB12
200.0000 mg | ORAL_TABLET | Freq: Every day | ORAL | Status: DC
  Administered 2021-05-25: 200 mg via ORAL
  Filled 2021-05-23 (×3): qty 2

## 2021-05-23 MED ORDER — INSULIN ASPART 100 UNIT/ML IJ SOLN
0.0000 [IU] | INTRAMUSCULAR | Status: DC
  Administered 2021-05-23: 3 [IU] via SUBCUTANEOUS
  Administered 2021-05-24 (×4): 1 [IU] via SUBCUTANEOUS
  Administered 2021-05-24: 3 [IU] via SUBCUTANEOUS
  Administered 2021-05-25: 2 [IU] via SUBCUTANEOUS

## 2021-05-23 MED ORDER — POLYETHYLENE GLYCOL 3350 17 GM/SCOOP PO POWD
1.0000 | Freq: Once | ORAL | Status: AC
  Administered 2021-05-23: 255 g via ORAL
  Filled 2021-05-23: qty 255

## 2021-05-23 MED ORDER — CHLORHEXIDINE GLUCONATE CLOTH 2 % EX PADS
6.0000 | MEDICATED_PAD | Freq: Every day | CUTANEOUS | Status: DC
  Administered 2021-05-23 – 2021-05-25 (×3): 6 via TOPICAL

## 2021-05-23 MED ORDER — TEDUGLUTIDE (RDNA) 5 MG ~~LOC~~ KIT: 5.0000 mL | PACK | Freq: Every day | SUBCUTANEOUS | Status: DC

## 2021-05-23 MED ORDER — GADOBUTROL 1 MMOL/ML IV SOLN
6.5000 mL | Freq: Once | INTRAVENOUS | Status: AC | PRN
  Administered 2021-05-23: 6.5 mL via INTRAVENOUS

## 2021-05-23 MED ORDER — SODIUM CHLORIDE 0.9% FLUSH
10.0000 mL | INTRAVENOUS | Status: DC | PRN
  Administered 2021-05-23: 20 mL
  Administered 2021-05-24: 10 mL

## 2021-05-23 MED ORDER — HYDROMORPHONE HCL 1 MG/ML IJ SOLN
1.0000 mg | INTRAMUSCULAR | Status: DC | PRN
  Administered 2021-05-23: 1 mg via INTRAVENOUS
  Filled 2021-05-23: qty 1

## 2021-05-23 MED ORDER — IOHEXOL 9 MG/ML PO SOLN: 500.0000 mL | ORAL | Status: AC

## 2021-05-23 MED ORDER — CYCLOSPORINE 0.05 % OP EMUL
1.0000 [drp] | Freq: Two times a day (BID) | OPHTHALMIC | Status: DC
  Administered 2021-05-23 – 2021-05-25 (×6): 1 [drp] via OPHTHALMIC
  Filled 2021-05-23 (×7): qty 1

## 2021-05-23 MED ORDER — NALOXONE HCL 4 MG/0.1ML NA LIQD: 1.0000 | Freq: Once | NASAL | Status: DC | PRN

## 2021-05-23 MED ORDER — PANTOPRAZOLE SODIUM 40 MG PO TBEC
80.0000 mg | DELAYED_RELEASE_TABLET | Freq: Every day | ORAL | Status: DC
  Filled 2021-05-23: qty 2

## 2021-05-23 MED ORDER — SODIUM CHLORIDE 0.9 % IV SOLN: INTRAVENOUS | Status: DC

## 2021-05-23 MED ORDER — PANTOPRAZOLE SODIUM 40 MG IV SOLR
40.0000 mg | Freq: Two times a day (BID) | INTRAVENOUS | Status: DC
  Administered 2021-05-23 – 2021-05-24 (×3): 40 mg via INTRAVENOUS
  Filled 2021-05-23 (×3): qty 40

## 2021-05-23 MED ORDER — PANCRELIPASE (LIP-PROT-AMYL) 36000-114000 UNITS PO CPEP
36000.0000 [IU] | ORAL_CAPSULE | Freq: Three times a day (TID) | ORAL | Status: DC
  Administered 2021-05-24 – 2021-05-25 (×5): 36000 [IU] via ORAL
  Filled 2021-05-23 (×10): qty 1

## 2021-05-23 NOTE — Consult Note (Addendum)
Referring Provider:  [facility]         Primary Care Physician:  [name], [name], MD Primary Gastroenterologist:   Thornton Park, MD ( 2020)          We were asked to see this patient for:    nausea, vomiting, diarrhea, Crohn's disease              Attending physician's note   I have taken a history, examined the patient and reviewed the chart. I agree with the Advanced Practitioner's note, impression and recommendations.  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  She has significant stool burden in the remaining colon, likely has overflow diarrhea GI pathogen panel pending Will plan for MiraLAX bowel purge Clear liquids Continue budesonide, no evidence of active colitis based on recent CT or colonoscopy December 2021 Elevated transaminases and alk phos likely secondary to TNA, steatohepatitis. Continue supportive care   The patient was provided an opportunity to ask questions and all were answered. The patient agreed with the plan and demonstrated an understanding of the instructions.  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 On Gattex. Followed by Dr. Domenica Fail at [facility]. Multiple hospital admissions for line infections / electrolyte abnormalities / chronic pain.  Currently admitted with left shoulder pain , nausea/ vomiting (3 months) and worsening of chronic diarrhea. No active disease on last colonoscopy in December 2021. Maintained on Budesonide 3 mg QOD.  --No active Crohn's disease on CT scan this admission. She was reportedly taking on 3 mg of Budesonide QOD, unclear why the dose has been increased to 9 mg daily now? --Etiology of nausea / vomiting unclear. Will  consider EGD.  --Regarding worsening malodorous diarrhea, she could be having overflow diarrhea as CT scan shows a lot of fecal material in colon. Also given altered anatomy SIBO is possible. However, will await C-diff results  --Agree with holding her anti-motility meds for now. At home she takes Lomotil and Imodium.   # Elevated liver chemistries, improving. Liver tests markedly elevated during recent admission for sepsis.   # Left shoulder pain. MRI without acute findings. Mild supraspinatus tendinoplasty  # AKI on CKD     HPI:                                                                                                                             Chief Complaint: nausea, vomiting, diarrhea  Caitlin Peters is a 60 y.o. female with a complicated past medical history.  She has Crohn's disease , diagnosed at age 1. She has required several surgeries resulting in short gut syndrome and TNA dependence. She has chronic diarrhea,  malabsorption with B12 deficiency and iron deficiency requiring IV iron. She has had multiple hospital admissions for line infections / sepsis. 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 In addition to Crohn's related surgeries she has had a partial hysterectomy and cholecystectomy.   Patient established care with Dr. Tarri Glenn on 11/25/19 after relocating from [place] to be closer to her daughter.  She was referred to [facility] program. Her malabsorption had previously been followed at the [facility].   Patient established care at [facility] 03/29/20 ( Dr. Domenica Fail). Crohn's symptoms at that time were controlled with Budesonide. Imodium was increased to QID, Gattex discussed. Following that visit she was hospitalized several times with line infections.  She had follow up with Dr. Domenica Fail in November 2021.  Colonoscopy in December 2021 for disease assessment showed patent and healthy appearing end to side ileo-colonic anastomosis in transverse colon.  Neo-terminal ileum appeared normal.  . Left and right sided colon biopsies were negative for active disease. Patient says her Budesondide dose was decreased sometime around February or March. She has been taking 3 mg QOD. Sh started a low dose of Gattex in January 2022.   After starting Gattex patient says her diarrhea improved. She was averaging about 4-6 stools a day and stools were started to become more solid. Over the last month she has been having increased amount of diarrhea up to 6-8 times a day. Diarrhea is non-bloody but is malodorous and contains mucous. Sometimes she is incontinent of stool. Additionally she had daily nausea with frequent vomiting for the last few months.   Patient was admitted yesterday morning. She had been at Infectious disease offce earlier in the day and complained of left shoulder pain, bilateral LE swelling. Should MRI was ordered by had to be aborted due to inability to lay down because of pain. She has been having shoulder pain since her last admission a couple of weeks ago.    This admission her WBC is normal. She has chronic anemia, hgb is stable at 9.9. She has AKI on CKD. Non contrast CT scan of abd / pelvis shows a large amount of fecal matter and gas within remaining colon and ? Proctitis.      PREVIOUS ENDOSCOPIC EVALUATIONS / PERTINENT STUDIES   Most recent colonoscopy done at [facility] December 2021 Patent end to side ileo-colonic anastomosis in transverse colon. It was patent, appeared healthy. Neo-terminal ileum appeared normal.    Diagnosis    A: Colon, ascending, biopsy - Colonic mucosa with no diagnostic alterations including no significant active inflammation and no evidence of dysplasia   B: Colon, descending, biopsy - Colonic mucosa with no diagnostic alterations including no significant active  inflammation and no evidence of dysplasia        Past Medical History:  Diagnosis Date   Acute pancreatitis 04/13/2020   Anasarca 10/2019   AVN (avascular necrosis of bone) ([name])    Cataract    Choledocholithiasis (sludge) s/p ERCP 10/2019 10/21/2020   Chronic pain syndrome    CKD (chronic kidney disease), stage III ([place])    Crohn disease ([place])    Crohn's disease of small & large intestine with SGS 1984   Caitlin Peters is a 60 year old female with a history of Crohn's disease diagnosed in [birth year] (age 15), history of long-term steroid use with osteoporosis, S/P multiple bowel  resections ([id number]) complicated by chronic back and abdominal pain, steatorrhea and short bowel syndrome. The patient has been left with ~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 attached to proximal transverse colon through rectum. She has been   Depression    Diverticulosis    GERD (gastroesophageal reflux disease)    HTN (hypertension)    IDA (iron deficiency anemia)    Malnutrition ([place])    Mass in chest    Osteoporosis    Osteoporosis 12/24/2014   Pancreatitis    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; 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; jejunal to transverse colon anastomosis Treated at [place] [facility] lengthening to 165cm Dr Alene Mires, [facility]   Vitamin B12 deficiency     Past Surgical History:  Procedure Laterality Date   ABDOMINAL ADHESION SURGERY  01/22/2018   [place]  11/26/2019   Procedure: BILIARY DILATION;  Surgeon: Jackquline Denmark, MD;  Location: [facility];  Service: Endoscopy;;   BILIARY DILATION  03/08/2020   Procedure: BILIARY DILATION;  Surgeon: Irving Copas., MD;  Location: [place];  Service: Gastroenterology;;   BIOPSY  03/08/2020   Procedure: BIOPSY;  Surgeon: Irving Copas., MD;  Location: [place];  Service: Gastroenterology;;   CHEST WALL  RESECTION     right thoracotomy,resection of chest mass with anterior rib and reconstruction using prosthetic mesh and video arthroscopy   CHOLECYSTECTOMY  01/22/2018   COLONOSCOPY  2019   ENTEROSTOMY CLOSURE  04/1999   ERCP N/A 11/26/2019   Procedure: ENDOSCOPIC RETROGRADE CHOLANGIOPANCREATOGRAPHY (ERCP);  Surgeon: Jackquline Denmark, MD;  Location: [facility];  Service: Endoscopy;  Laterality: N/A;   ERCP N/A 03/08/2020   Procedure: ENDOSCOPIC RETROGRADE CHOLANGIOPANCREATOGRAPHY (ERCP);  Surgeon: Irving Copas., MD;  Location: [place];  Service: Gastroenterology;  Laterality: N/A;   ESOPHAGOGASTRODUODENOSCOPY N/A 03/08/2020   Procedure: ESOPHAGOGASTRODUODENOSCOPY (EGD);  Surgeon: Irving Copas., MD;  Location: [place];  Service: Gastroenterology;  Laterality: N/A;   EUS N/A 03/08/2020   Procedure: UPPER ENDOSCOPIC ULTRASOUND (EUS) LINEAR;  Surgeon: Irving Copas., MD;  Location: [place];  Service: Gastroenterology;  Laterality: N/A;   ILEOCECETOMY  03/1999   ileocolon resection with abdominal stoma   ILEOSTOMY CLOSURE  2001   IR FLUORO GUIDE CV LINE LEFT  01/07/2020   IR FLUORO GUIDE CV LINE LEFT  03/09/2020   IR FLUORO GUIDE CV LINE LEFT  05/09/2020   IR FLUORO GUIDE CV LINE LEFT  07/20/2020   IR FLUORO GUIDE CV LINE RIGHT  08/05/2020   IR FLUORO GUIDE CV LINE RIGHT  04/03/2021   IR PTA VENOUS EXCEPT DIALYSIS CIRCUIT  01/07/2020   IR REMOVAL TUN CV CATH W/O FL  08/05/2020   IR US GUIDE VASC ACCESS LEFT     x 2 06/17/19 and 09/14/2019   IR US GUIDE VASC ACCESS RIGHT  08/05/2020   KNEE SURGERY     right knee    LAPAROSCOPIC SMALL BOWEL RESECTION  2009   2000-2009.  SB resections for Crohns Disease - now with Short gut   OMENTECTOMY  01/22/2018   PARTIAL HYSTERECTOMY  1984   with LSO   REMOVAL OF STONES  11/26/2019   Procedure: REMOVAL OF STONES;  Surgeon: Jackquline Denmark, MD;  Location: [facility];  Service: Endoscopy;;   REMOVAL OF STONES  03/08/2020    Procedure: REMOVAL OF STONES;  Surgeon: Irving Copas.,  MD;  Location: [place];  Service: Gastroenterology;;   SALPINGOOPHORECTOMY Left 1984   SALPINGOOPHORECTOMY Right 1990   SERIAL TRANSVERSE ENTEROPLASTY (STEP) - SMALL BOWEL LENGTHENING  01/22/2018   Dr Alene Mires, [place] length from 120 to 165cm    SMALL INTESTINE SURGERY  2002   SMALL INTESTINE SURGERY  2003   SPHINCTEROTOMY  11/26/2019   Procedure: SPHINCTEROTOMY;  Surgeon: Jackquline Denmark, MD;  Location: [facility];  Service: Endoscopy;;   TOTAL ABDOMINAL HYSTERECTOMY  1990   with RSO   UPPER GASTROINTESTINAL ENDOSCOPY      Prior to Admission medications   Medication Sig Start Date End Date Taking? Authorizing Provider  acetaminophen (TYLENOL) 325 MG tablet Take 650 mg by mouth every 6 (six) hours as needed for mild pain.    Yes [provider]  amLODipine (NORVASC) 10 MG tablet Take 10 mg by mouth daily.   Yes [provider]  budesonide (ENTOCORT EC) 3 MG 24 hr capsule Take 3 capsules (9 mg total) by mouth daily. 03/06/21  Yes [name], [name], MD  buPROPion [facility] SR) 100 MG 12 hr tablet Take 200 mg by mouth daily. 03/02/21  Yes [provider]  Calcium 200 MG TABS Take 200 mg by mouth daily.   Yes [provider]  carvedilol (COREG) 25 MG tablet Take 1 tablet (25 mg total) by mouth 2 (two) times daily with a meal. 05/16/21 06/15/21 Yes Pahwani, Einar Grad, MD  cholecalciferol (VITAMIN D3) 25 MCG (1000 UT) tablet Take 1,000 Units by mouth daily.    Yes [provider]  cycloSPORINE (RESTASIS) 0.05 % ophthalmic emulsion Place 1 drop into both eyes 2 (two) times daily.   Yes [provider]  denosumab (PROLIA) 60 MG/ML SOSY injection Inject 60 mg into the skin every 6 (six) months.   Yes [provider]  dexlansoprazole (DEXILANT) 60 MG capsule Take 1 capsule (60 mg total) by mouth daily. 12/28/20  Yes [name], [name], MD  Dextran  70-Hypromellose 0.1-0.3 % SOLN Place 1 drop into both eyes 4 (four) times daily.   Yes [provider]  diphenoxylate-atropine (LOMOTIL) 2.5-0.025 MG tablet TAKE 1 TABLET BY MOUTH 4 TIMES DAILY AS NEEDED FOR DIARRHEA OR  LOOSE  STOOLS Patient taking differently: Take 1 tablet by mouth 4 (four) times daily as needed for diarrhea or loose stools. 12/28/20  Yes [name], [name], MD  DULoxetine (CYMBALTA) 30 MG capsule TAKE 3 CAPSULES BY MOUTH ONCE DAILY Patient taking differently: Take 90 mg by mouth daily. 03/22/21  Yes [name], [name], MD  estradiol (ESTRACE) 2 MG tablet Take 1 tablet (2 mg total) by mouth daily. 12/07/20  Yes [name], [name], MD  famotidine (PEPCID) 20 MG tablet Take 20 mg by mouth daily as needed for heartburn or indigestion.   Yes [provider]  GATTEX 5 MG KIT Inject 5 mLs into the skin daily. 01/03/21  Yes [provider]  hydrALAZINE (APRESOLINE) 100 MG tablet Take 1 tablet (100 mg total) by mouth 3 (three) times daily. 05/16/21 06/15/21 Yes Pahwani, Einar Grad, MD  HYDROmorphone (DILAUDID) 4 MG tablet Take 4 mg by mouth in the morning, at noon, in the evening, and at bedtime. 04/18/21  Yes [provider]  levETIRAcetam (KEPPRA) 500 MG tablet Take 500 mg by mouth 2 (two) times daily.   Yes [provider]  lipase/protease/amylase (CREON) 36000 UNITS CPEP capsule Take 1 capsule (36,000 Units total) by mouth 3 (  three) times daily as needed (with meals for digestion). Patient taking differently: Take 36,000 Units by mouth with breakfast, with lunch, and with evening meal. 06/28/20  Yes [name], [name], MD  loperamide (IMODIUM) 2 MG capsule TAKE 1 CAPSULE BY MOUTH AS NEEDED FOR DIARRHEA OR LOOSE STOOLS Patient taking differently: Take 2 mg by mouth as needed for diarrhea or loose stools. 01/20/21  Yes [name], [name], MD  Menthol, Topical Analgesic, (ICY HOT BACK EX) Apply 1 application topically  daily as needed (back pain).   Yes [provider]  methadone (DOLOPHINE) 5 MG tablet Take 1 tablet (5 mg total) by mouth 5 (five) times daily. Patient taking differently: Take 5 mg by mouth 4 (four) times daily as needed for moderate pain. 06/30/20  Yes Florencia Reasons, MD  Multiple Vitamins-Minerals (MULTIVITAMIN ADULT PO) Take 1 tablet by mouth daily.   Yes [provider]  NARCAN 4 MG/0.1ML LIQD nasal spray kit Place 1 spray into the nose once as needed (overdose). 10/14/20  Yes [provider]  PRESCRIPTION MEDICATION Inject 1 each into the vein daily. Home TPN . [facility] . 1 bag for 12 hours. [phone]   Yes [provider]  Probiotic Product (PROBIOTIC-10 PO) Take 1 capsule by mouth daily.    Yes [provider]  promethazine (PHENERGAN) 25 MG tablet TAKE 1 TABLET BY MOUTH EVERY 6 HOURS AS NEEDED FOR NAUSEA FOR VOMITING Patient taking differently: Take 25 mg by mouth every 6 (six) hours as needed for nausea or vomiting. 04/21/21  Yes [name], [name], MD  sodium chloride 0.9 % infusion Inject 1 mL into the vein daily as needed (flush). 10/16/20  Yes [provider]  sucralfate (CARAFATE) 1 GM/10ML suspension Take 10 mLs (1 g total) by mouth 4 (four) times daily -  with meals and at bedtime. 06/28/20  Yes [name], [name], MD  Trace Minerals Cu-Mn-Se-Zn (TRALEMENT IV) Inject 1 mL into the vein See admin instructions. Used in TPN bag 4 times weekly   Yes [provider]  vitamin B-12 (CYANOCOBALAMIN) 1000 MCG tablet Take 1,000 mcg by mouth daily.    Yes [provider]    Current Facility-Administered Medications  Medication Dose Route Frequency Provider Last Rate Last Admin   0.9 %  sodium chloride infusion   Intravenous Continuous Karren Cobble, RPH 125 mL/hr at 05/23/21 0211 New Bag at 05/23/21 0211   amLODipine (NORVASC) tablet 10 mg  10 mg Oral Daily Shela Leff, MD       budesonide (ENTOCORT EC) 24 hr capsule 9 mg  9 mg Oral Daily Shela Leff, MD       buPROPion [facility] SR) 12 hr tablet 200 mg  200 mg Oral Daily Shela Leff, MD       carvedilol (COREG) tablet 25 mg  25 mg Oral BID WC Shela Leff, MD       Chlorhexidine Gluconate Cloth 2 % PADS 6 each  6 each Topical Daily Shela Leff, MD   6 each at 05/23/21 1018   cycloSPORINE (RESTASIS) 0.05 % ophthalmic emulsion 1 drop  1 drop Both Eyes BID Shela Leff, MD   1 drop at 05/23/21 1208   DULoxetine (CYMBALTA) DR capsule 90 mg  90 mg Oral Daily Shela Leff, MD       enoxaparin (LOVENOX) injection 40 mg  40 mg Subcutaneous Q24H Shela Leff, MD   40 mg at 05/23/21 [phone]  hydrALAZINE (APRESOLINE) injection 10 mg  10 mg Intravenous Q6H PRN Ghimire, Henreitta Leber, MD       hydrALAZINE (APRESOLINE) tablet 100 mg  100 mg Oral TID Shela Leff, MD       HYDROmorphone (DILAUDID) injection 1-2 mg  1-2 mg Intravenous Q2H PRN Jonetta Osgood, MD   2 mg at 05/23/21 1208   insulin aspart (novoLOG) injection 0-9 Units  0-9 Units Subcutaneous Q4H Karren Cobble, RPH       levETIRAcetam (KEPPRA) IVPB 500 mg/100 mL premix  500 mg Intravenous Q12H Jonetta Osgood, MD 400 mL/hr at 05/23/21 0856 500 mg at 05/23/21 0856   lipase/protease/amylase (CREON) capsule 36,000 Units  36,000 Units Oral TID with meals Shela Leff, MD       methadone (DOLOPHINE) tablet 5 mg  5 mg Oral 5 X Daily Ghimire, Henreitta Leber, MD       pantoprazole (PROTONIX) injection 40 mg  40 mg Intravenous Q12H Karren Cobble, RPH   40 mg at 05/23/21 5597   polyvinyl alcohol (LIQUIFILM TEARS) 1.4 % ophthalmic solution 1 drop  1 drop Both Eyes QID Shela Leff, MD   1 drop at 05/23/21 0859   promethazine (PHENERGAN) 25 mg in sodium chloride 0.9 % 50 mL IVPB  25 mg Intravenous Q6H PRN Shela Leff, MD 200 mL/hr at 05/23/21 1018 25 mg at 05/23/21 1018   sodium chloride  flush (NS) 0.9 % injection 10-40 mL  10-40 mL Intracatheter PRN Shela Leff, MD   20 mL at 05/23/21 4163   sucralfate (CARAFATE) 1 GM/10ML suspension 1 g  1 g Oral TID WC & HS Shela Leff, MD       Teduglutide (rDNA) KIT 5 mL  5 mL Subcutaneous Daily Shela Leff, MD       TPN ADULT (ION)   Intravenous Continuous TPN Karren Cobble, RPH        Allergies as of 05/22/2021 - Review Complete 05/22/2021  Allergen Reaction Noted   Meperidine Hives 08/14/2011   Hyoscyamine Hives and Swelling 07/15/2014   Cefepime Other (See Comments) 11/27/2017   Gabapentin Other (See Comments) 10/13/2019   Lyrica [pregabalin] Other (See Comments) 10/13/2019   Topamax [topiramate] Other (See Comments) 10/13/2019   Zosyn [piperacillin sod-tazobactam so]  08/03/2020   Fentanyl Rash 10/12/2019   Morphine and related Rash 10/12/2019    Family History  Problem Relation Age of Onset   Breast cancer Sister    Multiple sclerosis Sister    Diabetes Sister    Lupus Sister    Colon cancer Other    Crohn's disease Other    Seizures Mother    Glaucoma Mother    CAD Father    Heart disease Father    Hypertension Father     Social History   Socioeconomic History   Marital status: Single    Spouse name: Not on file   Number of children: Not on file   Years of education: Not on file   Highest education level: Not on file  Occupational History   Occupation: disabled  Tobacco Use   Smoking status: Former    Pack years: 0.00   Smokeless tobacco: Never  Vaping Use   Vaping Use: Never used  Substance and Sexual Activity   Alcohol use: Not Currently   Drug use: Never   Sexual activity: Yes  Other Topics Concern   Not on file  Social History Narrative   Right handed   One story apartment  No caffeine   Social Determinants of Radio broadcast assistant Strain: Low Risk    Difficulty of Paying Living Expenses: Not hard at all  Food Insecurity: Not on file  Transportation  Needs: No Transportation Needs   Lack of Transportation (Medical): No   Lack of Transportation (Non-Medical): No  Physical Activity: Not on file  Stress: Not on file  Social Connections: Not on file  Intimate Partner Violence: Not on file    Review of Systems: All systems reviewed and negative except where noted in HPI.    OBJECTIVE:    Physical Exam: Vital signs in last 24 hours: Temp:  [97.5 F (36.4 C)-98.2 F (36.8 C)] 97.5 F (36.4 C) (06/28 1216) Pulse Rate:  [2-85] 85 (06/28 1216) Resp:  [14-20] 16 (06/28 1216) BP: (117-190)/(60-87) 151/76 (06/28 1216) SpO2:  [98 %-100 %] 100 % (06/28 1216) Weight:  [67.1 kg] 67.1 kg (06/28 0230) Last BM Date: 05/23/21 General:   Alert  female in NAD Psych:  Pleasant, cooperative. Normal mood and affect. Eyes:  Pupils equal, sclera clear, no icterus.   Conjunctiva pink. Ears:  Normal auditory acuity. Nose:  No deformity, discharge,  or lesions. Neck:  Supple; no masses Lungs:  Clear throughout to auscultation.   No wheezes, crackles, or rhonchi.  Heart:  Regular rate and rhythm; no murmurs, no lower extremity edema Abdomen:  Soft, mildly distended, moderate RLQ tenderness. BS hypoactive, Rectal:  Deferred  Msk:  Symmetrical without gross deformities. . Neurologic:  Alert and  oriented x4;  grossly normal neurologically. Skin:  Intact without significant lesions or rashes.  Filed Weights   05/23/21 0230  Weight: 67.1 kg     Scheduled inpatient medications  amLODipine  10 mg Oral Daily   budesonide  9 mg Oral Daily   buPROPion  200 mg Oral Daily   carvedilol  25 mg Oral BID WC   Chlorhexidine Gluconate Cloth  6 each Topical Daily   cycloSPORINE  1 drop Both Eyes BID   DULoxetine  90 mg Oral Daily   enoxaparin (LOVENOX) injection  40 mg Subcutaneous Q24H   hydrALAZINE  100 mg Oral TID   insulin aspart  0-9 Units Subcutaneous Q4H   lipase/protease/amylase  36,000 Units Oral TID with meals   methadone  5 mg Oral 5 X  Daily   pantoprazole (PROTONIX) IV  40 mg Intravenous Q12H   polyvinyl alcohol  1 drop Both Eyes QID   sucralfate  1 g Oral TID WC & HS   Teduglutide (rDNA)  5 mL Subcutaneous Daily      Intake/Output from previous day: 06/27 0701 - 06/28 0700 In: -  Out: 2 [Urine:1; Stool:1] Intake/Output this shift: Total I/O In: 20 [I.V.:20] Out: -    Lab Results: Recent Labs    05/22/21 1515  WBC 5.5  HGB 9.9*  HCT 31.2*  PLT 225   BMET Recent Labs    05/22/21 1515 05/23/21 0324  NA 138 139  K 4.5 4.0  CL 107 109  CO2 23 21*  GLUCOSE 75 47*  BUN 36* 35*  CREATININE 1.63* 1.47*  CALCIUM 8.7* 8.1*   LFT Recent Labs    05/23/21 0324  PROT 5.5*  ALBUMIN 2.3*  AST 33  ALT 93*  ALKPHOS 143*  BILITOT 0.9   PT/INR No results for input(s): LABPROT, INR in the last 72 hours. Hepatitis Panel No results for input(s): HEPBSAG, HCVAB, HEPAIGM, HEPBIGM in the last 72 hours.   [name] Kitchen CBC  Latest Ref Rng & Units 05/22/2021 05/15/2021 05/14/2021  WBC 4.0 - 10.5 K/uL 5.5 3.4(L) 3.9(L)  Hemoglobin 12.0 - 15.0 g/dL 9.9(L) 8.5(L) 9.3(L)  Hematocrit 36.0 - 46.0 % 31.2(L) 26.2(L) 28.2(L)  Platelets 150 - 400 K/uL 225 180 198    . CMP Latest Ref Rng & Units 05/23/2021 05/22/2021 05/16/2021  Glucose 70 - 99 mg/dL 47(L) 75 120(H)  BUN 6 - 20 mg/dL 35(H) 36(H) 15  Creatinine 0.44 - 1.00 mg/dL 1.47(H) 1.63(H) 0.99  Sodium 135 - 145 mmol/L 139 138 137  Potassium 3.5 - 5.1 mmol/L 4.0 4.5 3.7  Chloride 98 - 111 mmol/L 109 107 109  CO2 22 - 32 mmol/L 21(L) 23 25  Calcium 8.9 - 10.3 mg/dL 8.1(L) 8.7(L) 8.2(L)  Total Protein 6.5 - 8.1 g/dL 5.5(L) 6.8 -  Total Bilirubin 0.3 - 1.2 mg/dL 0.9 0.6 -  Alkaline Phos 38 - 126 U/L 143(H) 199(H) -  AST 15 - 41 U/L 33 54(H) -  ALT 0 - 44 U/L 93(H) 127(H) -   Studies/Results: CT ABDOMEN PELVIS WO CONTRAST  Result Date: 05/23/2021 CLINICAL DATA:  Abdominal pain of acute nature. Short gut syndrome. Chronic diarrhea. Crohn's disease. EXAM: CT ABDOMEN AND  PELVIS WITHOUT CONTRAST TECHNIQUE: Multidetector CT imaging of the abdomen and pelvis was performed following the standard protocol without IV contrast. COMPARISON:  Ultrasound 13 days ago.  CT 04/07/2021 FINDINGS: Lower chest: Lung bases are clear. Hepatobiliary: Pneumobilia as seen chronically. Previous cholecystectomy. No focal parenchymal finding. Pancreas: No pancreatic abnormality seen. Spleen: Normal Adrenals/Urinary Tract: Adrenal glands are normal. Kidneys appear normal without contrast. Bladder is full but otherwise unremarkable. Stomach/Bowel: Multiple previous surgical procedures affecting the small intestine. Previous partial colectomy. Large amount of fecal matter and gas within the remaining colon. Previously seen findings of wall thickening in the sigmoid region are resolved. There could possibly be some ongoing proctitis. Vascular/Lymphatic: Venous catheter in place from the right groin has its tip in the IVC below the diaphragm. Aorta appears unremarkable. Reproductive: Previous hysterectomy.  No pelvic mass. Other: No free fluid or air. Musculoskeletal: Ordinary lower lumbar degenerative changes. IMPRESSION: Resolution of the previously seen colitis pattern. Large amount of fecal matter and gas within the remaining colon, all the way to the rectum/anus. Cannot rule out a degree of proctitis. Electronically Signed   By: [name] Chimes M.D.   On: 05/23/2021 10:16   MR SHOULDER LEFT W WO CONTRAST  Result Date: 05/23/2021 CLINICAL DATA:  Left shoulder pain, T3-4 thoracic discitis, fever and abdominal pain, history of Crohn's disease. EXAM: MRI OF THE LEFT SHOULDER WITHOUT AND WITH CONTRAST TECHNIQUE: Multiplanar, multisequence MR imaging of the left shoulder was performed before and after the administration of intravenous contrast. CONTRAST:  6.59m GADAVIST GADOBUTROL 1 MMOL/ML IV SOLN COMPARISON:  None. FINDINGS: Despite efforts by the technologist and patient, motion artifact is present on today's  exam and could not be eliminated. This reduces exam sensitivity and specificity. Rotator cuff:  Mild distal supraspinatus tendinopathy anteriorly. Muscles:  Unremarkable Biceps long head:  Unremarkable Acromioclavicular Joint: No significant arthropathy. Type II acromion. No significant regional bursitis. Glenohumeral Joint: Mild degenerative chondral thinning. No joint effusion or substantial synovitis. Labrum:  Grossly unremarkable Bones: No significant extra-articular osseous abnormalities identified. Other: Small left axillary lymph nodes are present. IMPRESSION: 1. Mild supraspinatus tendinopathy. 2. No findings of regional infection. 3. Small left axillary lymph nodes. 4. Mild degenerative glenohumeral chondral thinning. Electronically Signed   By: WVan ClinesM.D.   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

## 2021-05-23 NOTE — Progress Notes (Signed)
Initial Nutrition Assessment  DOCUMENTATION CODES:   Not applicable  INTERVENTION:   -TPN management per pharmacy  NUTRITION DIAGNOSIS:   Inadequate oral intake related to altered GI function as evidenced by NPO status.  GOAL:   Patient will meet greater than or equal to 90% of their needs  MONITOR:   Diet advancement, Labs, Weight trends, Skin, I & O's  REASON FOR ASSESSMENT:   Consult New TPN/TNA  ASSESSMENT:   Caitlin Peters is a 60 y.o. 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  Recently admitted on 05/06/2021 for fever, abdominal pain, AKI, and elevated liver enzymes.  Liver ultrasound was unrevealing.  Elevated liver enzymes were felt to be due to TPN.  Initially treated with broad-spectrum antibiotics which were discontinued by ID as there was no clear source of infection.  LFTs improved and TPN was restarted on 6/19.  Discharged on 05/16/2021.  She had a follow-up visit with infectious disease today and complained of bilateral leg swelling and persistent left shoulder pain.  X-ray was unrevealing.  Sent to the ED for further evaluation and shoulder MRI.  Pt admitted with nausea, vomiting, and abdominal pain.   Reviewed I/O's: +20 ml x 24 hours and +18 ml since admission  Pt sleeping soundly at time of visit. She did not arouse to voice or touch.   Pt familiar to this RD from multiple prior admissions. Pt has history of short gut syndrome and Crohn's disease. 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 She consumes a regular diet for pleasure PTA.  Per pharmacy notes, PTA on 12 hour cyclic TPN (3710 kcals and 85 grams protein); last received on 05/21/21. Plan to initiate TPN at 1800- 80 ml/hr, which provides 1939 kcals and 96 grams protein, which meets 100% of estimated kcal and  protein needs.   Reviewed wt hx; pt wt has been stable over the past month.    Medications reviewed and include lipase.  Labs reviewed. Inpatient orders for glycemic control are 0-9 units insulin aspart every 4 hours.  NUTRITION - FOCUSED PHYSICAL EXAM:  Flowsheet Row Most Recent Value  Orbital Region No depletion  Upper Arm Region No depletion  Thoracic and Lumbar Region No depletion  Buccal Region No depletion  Temple Region No depletion  Clavicle Bone Region No depletion  Clavicle and Acromion Bone Region No depletion  Scapular Bone Region No depletion  Dorsal Hand No depletion  Patellar Region No depletion  Anterior Thigh Region No depletion  Posterior Calf Region No depletion  Edema (RD Assessment) None  Hair Reviewed  Eyes Reviewed  Mouth Reviewed  Skin Reviewed  Nails Reviewed       Diet Order:   Diet Order             Diet NPO time specified  Diet effective now                   EDUCATION NEEDS:   No education needs have been identified at this time  Skin:  Skin Assessment: Reviewed RN Assessment  Last BM:  05/23/21  Height:   Ht Readings from Last 1 Encounters:  05/06/21 5' 8"  (1.727 m)    Weight:   Wt Readings from Last 1 Encounters:  05/23/21 67.1 kg    Ideal Body Weight:  63.6 kg  BMI:  Body mass index is 22.49 kg/m.  Estimated Nutritional Needs:   Kcal:  1800-2000  Protein:  90-105 grams  Fluid:  > 1.8 L    Loistine Chance, RD, LDN, [place] Registered Dietitian II Certified Diabetes Care and Education Specialist Please refer to [facility] for RD and/or RD on-call/weekend/after hours pager

## 2021-05-23 NOTE — TOC Initial Note (Addendum)
Transition of Care [facility]) - Initial/Assessment Note    Patient Details  Name: Caitlin Peters MRN: [id number] Date of Birth: [birth year]  Transition of Care [facility]) CM/SW Contact:    Ninfa Meeker, RN Phone Number: 05/23/2021, 12:52 PM  Clinical Narrative:    Patient is a 60 y.o. female with history of Crohn's disease-s/p multiple small bowel resections-with short gut syndrome (chronic diarrhea-4 BMs at baseline)-on TNA at home. Referred to the ED from ID clinic for evaluation of worsening left shoulder pain for the past few weeks, worsening diarrhea, vomiting and worsening abdominal pain.  [place] informed Carolynn Sayers with [facility], and CM, that due to staffing they will not be able to accept patient back at discharge. Referral called to [facility] with [facility], CM waiting to hear if patient accepted.         Patient Goals and CMS Choice        Expected Discharge Plan and Services                                                Prior Living Arrangements/Services                       Activities of Daily Living Home Assistive Devices/Equipment: Eyeglasses, Gilford Rile (specify type) (rolling walker) ADL Screening (condition at time of admission) Patient's cognitive ability adequate to safely complete daily activities?: Yes Is the patient deaf or have difficulty hearing?: No Does the patient have difficulty seeing, even when wearing glasses/contacts?: No Does the patient have difficulty concentrating, remembering, or making decisions?: No Patient able to express need for assistance with ADLs?: Yes Does the patient have difficulty dressing or bathing?: No Independently performs ADLs?: Yes (appropriate for developmental age) Does the patient have difficulty walking or climbing stairs?: No Weakness of Legs: Both Weakness of Arms/Hands: Both  Permission Sought/Granted                  Emotional Assessment              Admission diagnosis:   Weakness [R53.1] AKI (acute kidney injury) ([place]) [N17.9] Patient Active Problem List   Diagnosis Date Noted   Left shoulder pain 05/23/2021   Elevated LFTs    Sepsis ([place]) 05/06/2021   Acidosis 04/08/2021   Colitis 04/07/2021   Intractable nausea and vomiting 03/11/2021   Thoracic discitis 02/10/2021   Pressure injury of skin 12/18/2020   Bacteremia 12/17/2020   Seizures ([place]) 12/17/2020   Drug-seeking behavior 12/16/2020   Seizure ([place]) 12/15/2020   CKD (chronic kidney disease) stage 3, GFR 30-59 ml/min (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   Abdominal pain 11/14/7587   Acute metabolic encephalopathy    Hypomagnesemia    Partial small bowel obstruction ([place])    Bacteremia associated with intravascular line ([place]) 08/04/2020   Enterobacter sepsis ([place]) 07/22/2020   Fever of unknown origin 06/27/2020   Anxiety 06/03/2020   Acute pancreatitis 04/13/2020   Infection due to Acinetobacter species 03/10/2020   Pancytopenia ([place]) 03/05/2020   Central line infection  Elevated liver enzymes 01/02/2020   Cholangitis    Anasarca 10/28/2019   Acute kidney injury superimposed on chronic kidney disease ([place]) 10/28/2019   Falls 10/28/2019   Malnutrition ([place])    Vitamin B12 deficiency    GERD (gastroesophageal reflux disease)    Chronic pain syndrome    Hypokalemia due to excessive gastrointestinal loss of potassium 10/13/2019   Fever    Hypokalemia 10/12/2019   Chronic diarrhea 10/12/2019   Dysuria 10/12/2019   Bilateral lower extremity edema 10/12/2019   AKI (acute kidney injury) ([place]) 10/12/2019   Fungemia 08/27/2019   IDA (iron  deficiency anemia) 11/03/2018   Dilation of biliary tract 08/28/2018   Severe diarrhea 03/07/2018   LFTs abnormal 01/27/2018   GI tract obstruction ([place]) 01/27/2018   Gram-negative bacteremia 09/13/2017   HTN (hypertension), benign 12/02/2016   Intractable pain 12/02/2016   Anemia 12/02/2016   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   Avascular 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[place]) 06/14/2015   Polyarthralgia 05/03/2015   On total parenteral nutrition (TPN) 12/30/2014   Osteoporosis 12/24/2014   Low back pain 12/16/2014   Vitamin D deficiency 12/16/2014   Short gut syndrome 07/15/2014   History of colonic diverticulitis 2014   Depression 07/24/2012   Gram-positive bacteremia 07/24/2012   Small bowel obstruction due to adhesions ([place]) 07/24/2012   Diarrhea 12/13/2011   Neuralgia and neuritis 06/01/2011   Myalgia and myositis 08/12/2003   Crohn's disease of small & large intestine with [place]   PCP:  [name], [name], MD Pharmacy:   [facility], [address] [place] [address] Phone: [phone] Fax: [phone]     Social Determinants of Health (SDOH) Interventions    Readmission Risk Interventions Readmission Risk Prevention Plan 02/10/2021 12/21/2020 11/09/2020  Transportation Screening Complete Complete Complete  PCP or Specialist Appt within 3-5 Days - - -  Not Complete comments - - -  HRI or [place] for [place] consult not completed comments - - -  Palliative Care Screening - - -  Medication Review Press photographer) Complete - Complete  PCP or Specialist appointment within 3-5 days of discharge - Complete Complete  HRI or Home Care Consult Complete Complete Complete  SW Recovery Care/Counseling Consult Complete Complete -  Palliative Care Screening Not Applicable - Not [place] Not Applicable - Not Applicable

## 2021-05-23 NOTE — Progress Notes (Addendum)
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   Assessment: Recent discharge 6/21 from [facility]>readmitted 6/27 witih c/o L shoulder pain and swelling to BLE and incontinence, weakness, nausea.  Patient is on home TPN due to h/o pancreatitis, Crohn's, short gut syndrome with other GI comorbidities. TPN was held for a time during her recent previous admission for elevated LFT's that resolved and TPN was resumed 6/19.  PMH: Pancreatitis on chronic TPN, Crohn's, short gut syndrome, anasarca, cataracts, h/o diverticulosis, OP, B12 def, PCM, CKD3, chronic pain syndrome, AVN, HTN, anxiety, depression, GERD, ACD, seizures, COVID 11/2020, ++ more  Glucose / Insulin: 47 on BMET Electrolytes: Bicarb 21 low, CoCa 9.46 Renal: SCr 1.47 Hepatic: ALT 93 remains slightly elevated. Alb/prealbumin: alb 2.3, prealb 25.7 (6/20), Prealbumin (6/20) 25.7 Intake / Output; MIVF: NS at 130m/hr GI Imaging: GI Surgeries / Procedures:  GI Meds: Entocort, Creon 36,000 TID, Protonix 822md changed to IV, Sucrealfate TIDWC, Teduglutide daily (for SBS)--family to provide  Central access: PICC line TPN start date: (at home). Inpatient start 6/28>>  Nutritional Goals (per RD recommendation on 6/19): kCal: 1800-2000, Protein: 90-105, Fluid: >= 2 L/day  (PTA formula) 1088 kcal, Protein (15%) 85g , Carb 748kcal , lipids SMOF 60g/d: Na150, K 92, Cl 74, Bicarb 216,5, Phos 24, Ca 10, Mg 10, N 13.4, aluminum 14: Infusion 180044mver 12 hrs. --last bag Sunday 6/26 PTA.  Goal TPN rate is 80 mL/hr will prvide 96g protein, 57.6g lipids, 288g carbs, 1939 total kcal  Current Nutrition:  Regular diet, TPN  Plan:  While NPO... Start TPN at 1800 (not TPN naive) at 81m38m around the clock until stable, then can change back to home cyclic over 12 hrs. Standard lytes and  trace elements, max acetate Decrease IVF to 38m/99mhen TPN starts Will start SSI and sensitive SSI TPN labs Mon/Thurs and PRN   Caitlin Peters S. RoberAlford HighlandrmD, BCPS Clinical Staff Pharmacist [email or web address] 05/23/2021 7:54 AM

## 2021-05-23 NOTE — ED Notes (Signed)
Attempted x 1

## 2021-05-23 NOTE — Progress Notes (Signed)
PROGRESS NOTE        PATIENT DETAILS Name: Caitlin Peters Age: 60 y.o. Sex: female Date of Birth: 01/26/1961 Admit Date: 05/22/2021 Admitting Physician Shela Leff, MD PZW:CHENIDPOE Everardo Beals, MD  Brief Narrative: Patient is a 60 y.o. female history of Crohn's disease-s/p multiple small bowel resections-with short gut syndrome (chronic diarrhea-4 BMs at baseline)-on TNA at home-recent history of T2-T5 discitis in March 2022 (completed IV Abx)-referred to the ED from ID clinic for evaluation of worsening left shoulder pain for the past few weeks, worsening diarrhea, vomiting and worsening abdominal pain.  She was found to have AKI-and subsequently admitted to the hospitalist service.  ID recommended a MRI of her left shoulder.  Significant events: 6/27>> referred to ED from ID clinic-worsening left shoulder pain-diarrhea/vomiting/abdominal pain-found to have AKI.  Significant studies: 6/28>> MRI left shoulder: No findings of infection-mild supraspinatus tendinopathy.  Antimicrobial therapy: None  Microbiology data: 6/27>> COVID PCR: Negative  Procedures : None  Consults: None  DVT Prophylaxis : enoxaparin (LOVENOX) injection 40 mg Start: 05/23/21 1000    Subjective: Multiple episodes of nausea/vomiting this morning.  Has not been able to take any of oral narcotics/oral medications due to persistent vomiting.  Awaiting CT abdomen.  Assessment/Plan: AKI on CKD stage II: Hemodynamically mediated-slowly improving with supportive care.  History of Crohn's disease-s/p multiple small bowel resections-with resultant short gut syndrome: Has diarrhea at baseline-normally has around 4 BMs daily-Per patient over the past few weeks-has been having around 6-7 BMs daily.  She is compliant to TNA and Gattex.  Remains on budesonide.  Await CT of the abdomen-to see if she has any features consistent with a Crohn's disease flare-she also does have a  prior history of C. difficile colitis-and may need stool studies.  Intractable nausea/vomiting: Unable to take any of her oral medications this morning-she has had numerous episodes of vomiting-unclear etiology at this point-lipase levels within normal limit-await CT of the abdomen.  Supportive care with antiemetics for now.  Left shoulder pain: No evidence of septic arthritis-has supraspinatus tendinopathy/degenerative arthritis-we will need supportive care.  History of seizures: Switch to IV Keppra as patient continues to have intractable vomiting and not tolerating any oral intake.  Transaminitis: Had significantly elevated AST/ALT earlier this month-has down trended significantly-we will monitor periodically.  Normocytic anemia: Due to anemia of chronic disease-stable for close monitoring for now.  Chronic pain syndrome: Has mostly chronic abdominal pain-on methadone/Dilaudid at home-currently unable to take any of her oral medications due to persistent vomiting-switching to IV Dilaudid.  Plan is to switch to oral narcotic regimen when able.  HTN: Unable to take any of her oral antihypertensives due to vomiting-we will add as needed IV hydralazine.  If BP remains persistently elevated-we will start scheduled IV Lopressor.  GERD: Continue PPI/Carafate-PPI switched to IV route for now.  History of depression: Continue Wellbutrin/Cymbalta if oral intake permits.  Debility/deconditioning: Worsened by acute illness-obtain PT/OT eval.  Diet: Diet Order             Diet NPO time specified  Diet effective now                    Code Status: Full code   Family Communication: None at bedside  Disposition Plan: Status is: Observation  The patient will require care spanning > 2 midnights and should be  moved to inpatient because: Inpatient level of care appropriate due to severity of illness  Dispo: The patient is from: Home              Anticipated d/c is to: Home               Patient currently is not medically stable to d/c.   Difficult to place patient No    Barriers to Discharge: Intractable nausea/vomiting-AKI-unable to tolerate any oral intake.  Will need IV fluids hydration-further work-up with a CT abdomen pending-we will need to tolerate oral intake before any consideration of discharge can be considered.  Antimicrobial agents: Anti-infectives (From admission, onward)    None        Time spent: 35 minutes-Greater than 50% of this time was spent in counseling, explanation of diagnosis, planning of further management, and coordination of care.  MEDICATIONS: Scheduled Meds:  amLODipine  10 mg Oral Daily   budesonide  9 mg Oral Daily   buPROPion  200 mg Oral Daily   carvedilol  25 mg Oral BID WC   Chlorhexidine Gluconate Cloth  6 each Topical Daily   cycloSPORINE  1 drop Both Eyes BID   DULoxetine  90 mg Oral Daily   enoxaparin (LOVENOX) injection  40 mg Subcutaneous Q24H   hydrALAZINE  100 mg Oral TID   iohexol       lipase/protease/amylase  36,000 Units Oral TID with meals   methadone  5 mg Oral 5 X Daily   pantoprazole (PROTONIX) IV  40 mg Intravenous Q12H   polyvinyl alcohol  1 drop Both Eyes QID   sucralfate  1 g Oral TID WC & HS   Teduglutide (rDNA)  5 mL Subcutaneous Daily   Continuous Infusions:  sodium chloride 125 mL/hr at 05/23/21 0211   levETIRAcetam 500 mg (05/23/21 0856)   promethazine (PHENERGAN) injection (IM or IVPB)     PRN Meds:.hydrALAZINE, HYDROmorphone (DILAUDID) injection, promethazine (PHENERGAN) injection (IM or IVPB), sodium chloride flush   PHYSICAL EXAM: Vital signs: Vitals:   05/23/21 0230 05/23/21 0515 05/23/21 0600 05/23/21 0737  BP:  (!) 171/76  (!) 190/80  Pulse:  (!) 2 71 74  Resp:  14 14 16   Temp:    98 F (36.7 C)  TempSrc:  Oral  Oral  SpO2:  100% 99% 100%  Weight: 67.1 kg      Filed Weights   05/23/21 0230  Weight: 67.1 kg   Body mass index is 22.49 kg/m.   Gen Exam:Alert awake-not  in any distress HEENT:atraumatic, normocephalic Chest: B/L clear to auscultation anteriorly CVS:S1S2 regular Abdomen: Mildly but diffusely tender but soft. Extremities:no edema Neurology: Non focal Skin: no rash  I have personally reviewed following labs and imaging studies  LABORATORY DATA: CBC: Recent Labs  Lab 05/22/21 1515  WBC 5.5  NEUTROABS 2.9  HGB 9.9*  HCT 31.2*  MCV 96.0  PLT 456    Basic Metabolic Panel: Recent Labs  Lab 05/22/21 1515 05/23/21 0324  NA 138 139  K 4.5 4.0  CL 107 109  CO2 23 21*  GLUCOSE 75 47*  BUN 36* 35*  CREATININE 1.63* 1.47*  CALCIUM 8.7* 8.1*    GFR: Estimated Creatinine Clearance: 41.1 mL/min (A) (by C-G formula based on SCr of 1.47 mg/dL (H)).  Liver Function Tests: Recent Labs  Lab 05/22/21 1515 05/23/21 0324  AST 54* 33  ALT 127* 93*  ALKPHOS 199* 143*  BILITOT 0.6 0.9  PROT 6.8 5.5*  ALBUMIN  2.9* 2.3*   No results for input(s): LIPASE, AMYLASE in the last 168 hours. No results for input(s): AMMONIA in the last 168 hours.  Coagulation Profile: No results for input(s): INR, PROTIME in the last 168 hours.  Cardiac Enzymes: No results for input(s): CKTOTAL, CKMB, CKMBINDEX, TROPONINI in the last 168 hours.  BNP (last 3 results) No results for input(s): PROBNP in the last 8760 hours.  Lipid Profile: No results for input(s): CHOL, HDL, LDLCALC, TRIG, CHOLHDL, LDLDIRECT in the last 72 hours.  Thyroid Function Tests: No results for input(s): TSH, T4TOTAL, FREET4, T3FREE, THYROIDAB in the last 72 hours.  Anemia Panel: No results for input(s): VITAMINB12, FOLATE, FERRITIN, TIBC, IRON, RETICCTPCT in the last 72 hours.  Urine analysis:    Component Value Date/Time   COLORURINE YELLOW 05/06/2021 2026   APPEARANCEUR CLEAR 05/06/2021 2026   LABSPEC 1.014 05/06/2021 2026   PHURINE 8.0 05/06/2021 2026   GLUCOSEU NEGATIVE 05/06/2021 2026   HGBUR NEGATIVE 05/06/2021 2026   BILIRUBINUR NEGATIVE 05/06/2021 2026    KETONESUR NEGATIVE 05/06/2021 2026   PROTEINUR >=300 (A) 05/06/2021 2026   NITRITE NEGATIVE 05/06/2021 2026   LEUKOCYTESUR NEGATIVE 05/06/2021 2026    Sepsis Labs: Lactic Acid, Venous    Component Value Date/Time   LATICACIDVEN 0.7 05/06/2021 2010    MICROBIOLOGY: Recent Results (from the past 240 hour(s))  SARS CORONAVIRUS 2 (TAT 6-24 HRS) Nasopharyngeal Nasopharyngeal Swab     Status: None   Collection Time: 05/22/21  9:45 PM   Specimen: Nasopharyngeal Swab  Result Value Ref Range Status   SARS Coronavirus 2 NEGATIVE NEGATIVE Final    Comment: (NOTE) SARS-CoV-2 target nucleic acids are NOT DETECTED.  The SARS-CoV-2 RNA is generally detectable in upper and lower respiratory specimens during the acute phase of infection. Negative results do not preclude SARS-CoV-2 infection, do not rule out co-infections with other pathogens, and should not be used as the sole basis for treatment or other patient management decisions. Negative results must be combined with clinical observations, patient history, and epidemiological information. The expected result is Negative.  Fact Sheet for Patients: SugarRoll.be  Fact Sheet for Healthcare Providers: https://www.woods-mathews.com/  This test is not yet approved or cleared by the [place] FDA and  has been authorized for detection and/or diagnosis of SARS-CoV-2 by FDA under an Emergency Use Authorization (EUA). This EUA will remain  in effect (meaning this test can be used) for the duration of the COVID-19 declaration under Se ction 564(b)(1) of the Act, 21 U.S.C. section 360bbb-3(b)(1), unless the authorization is terminated or revoked sooner.  Performed at [facility], [place] [address] [place], [place] [address]     RADIOLOGY STUDIES/RESULTS: MR SHOULDER LEFT W WO CONTRAST  Result Date: 05/23/2021 CLINICAL DATA:  Left shoulder pain, T3-4 thoracic discitis, fever and abdominal  pain, history of Crohn's disease. EXAM: MRI OF THE LEFT SHOULDER WITHOUT AND WITH CONTRAST TECHNIQUE: Multiplanar, multisequence MR imaging of the left shoulder was performed before and after the administration of intravenous contrast. CONTRAST:  6.80m GADAVIST GADOBUTROL 1 MMOL/ML IV SOLN COMPARISON:  None. FINDINGS: Despite efforts by the technologist and patient, motion artifact is present on today's exam and could not be eliminated. This reduces exam sensitivity and specificity. Rotator cuff:  Mild distal supraspinatus tendinopathy anteriorly. Muscles:  Unremarkable Biceps long head:  Unremarkable Acromioclavicular Joint: No significant arthropathy. Type II acromion. No significant regional bursitis. Glenohumeral Joint: Mild degenerative chondral thinning. No joint effusion or substantial synovitis. Labrum:  Grossly unremarkable Bones:  No significant extra-articular osseous abnormalities identified. Other: Small left axillary lymph nodes are present. IMPRESSION: 1. Mild supraspinatus tendinopathy. 2. No findings of regional infection. 3. Small left axillary lymph nodes. 4. Mild degenerative glenohumeral chondral thinning. Electronically Signed   By: Van Clines M.D.   On: 05/23/2021 08:07     LOS: 0 days   Oren Binet, MD  [facility]    To contact the attending provider between 7A-7P or the covering provider during after hours 7P-7A, please log into the web site [email or web address] and access using universal [place] password for that web site. If you do not have the password, please call the hospital operator.  05/23/2021, 10:09 AM

## 2021-05-24 ENCOUNTER — Telehealth: Payer: Self-pay | Admitting: Internal Medicine

## 2021-05-24 DIAGNOSIS — M25512 Pain in left shoulder: Secondary | ICD-10-CM

## 2021-05-24 DIAGNOSIS — R531 Weakness: Secondary | ICD-10-CM | POA: Diagnosis not present

## 2021-05-24 DIAGNOSIS — N179 Acute kidney failure, unspecified: Secondary | ICD-10-CM | POA: Diagnosis not present

## 2021-05-24 DIAGNOSIS — K5904 Chronic idiopathic constipation: Secondary | ICD-10-CM

## 2021-05-24 LAB — DIFFERENTIAL
Abs Immature Granulocytes: 0.01 10*3/uL (ref 0.00–0.07)
Basophils Absolute: 0 10*3/uL (ref 0.0–0.1)
Basophils Relative: 0 %
Eosinophils Absolute: 0.1 10*3/uL (ref 0.0–0.5)
Eosinophils Relative: 3 %
Immature Granulocytes: 0 %
Lymphocytes Relative: 37 %
Lymphs Abs: 1.2 10*3/uL (ref 0.7–4.0)
Monocytes Absolute: 0.3 10*3/uL (ref 0.1–1.0)
Monocytes Relative: 9 %
Neutro Abs: 1.7 10*3/uL (ref 1.7–7.7)
Neutrophils Relative %: 51 %

## 2021-05-24 LAB — CBC
HCT: 26 % — ABNORMAL LOW (ref 36.0–46.0)
Hemoglobin: 8.2 g/dL — ABNORMAL LOW (ref 12.0–15.0)
MCH: 30.4 pg (ref 26.0–34.0)
MCHC: 31.5 g/dL (ref 30.0–36.0)
MCV: 96.3 fL (ref 80.0–100.0)
Platelets: 194 10*3/uL (ref 150–400)
RBC: 2.7 MIL/uL — ABNORMAL LOW (ref 3.87–5.11)
RDW: 14.4 % (ref 11.5–15.5)
WBC: 3.4 10*3/uL — ABNORMAL LOW (ref 4.0–10.5)
nRBC: 0 % (ref 0.0–0.2)

## 2021-05-24 LAB — COMPREHENSIVE METABOLIC PANEL
ALT: 77 U/L — ABNORMAL HIGH (ref 0–44)
AST: 25 U/L (ref 15–41)
Albumin: 2.3 g/dL — ABNORMAL LOW (ref 3.5–5.0)
Alkaline Phosphatase: 153 U/L — ABNORMAL HIGH (ref 38–126)
Anion gap: 4 — ABNORMAL LOW (ref 5–15)
BUN: 22 mg/dL — ABNORMAL HIGH (ref 6–20)
CO2: 23 mmol/L (ref 22–32)
Calcium: 8.3 mg/dL — ABNORMAL LOW (ref 8.9–10.3)
Chloride: 109 mmol/L (ref 98–111)
Creatinine, Ser: 1.2 mg/dL — ABNORMAL HIGH (ref 0.44–1.00)
GFR, Estimated: 52 mL/min — ABNORMAL LOW (ref >60)
Glucose, Bld: 124 mg/dL — ABNORMAL HIGH (ref 70–99)
Potassium: 4.4 mmol/L (ref 3.5–5.1)
Sodium: 136 mmol/L (ref 135–145)
Total Bilirubin: 0.6 mg/dL (ref 0.3–1.2)
Total Protein: 5.8 g/dL — ABNORMAL LOW (ref 6.5–8.1)

## 2021-05-24 LAB — GLUCOSE, CAPILLARY
Glucose-Capillary: 121 mg/dL — ABNORMAL HIGH (ref 70–99)
Glucose-Capillary: 124 mg/dL — ABNORMAL HIGH (ref 70–99)
Glucose-Capillary: 133 mg/dL — ABNORMAL HIGH (ref 70–99)
Glucose-Capillary: 140 mg/dL — ABNORMAL HIGH (ref 70–99)
Glucose-Capillary: 145 mg/dL — ABNORMAL HIGH (ref 70–99)
Glucose-Capillary: 146 mg/dL — ABNORMAL HIGH (ref 70–99)
Glucose-Capillary: 223 mg/dL — ABNORMAL HIGH (ref 70–99)

## 2021-05-24 LAB — PHOSPHORUS: Phosphorus: 2.8 mg/dL (ref 2.5–4.6)

## 2021-05-24 LAB — HEMOGLOBIN A1C
Hgb A1c MFr Bld: 5 % (ref 4.8–5.6)
Mean Plasma Glucose: 97 mg/dL

## 2021-05-24 LAB — MAGNESIUM: Magnesium: 1.9 mg/dL (ref 1.7–2.4)

## 2021-05-24 LAB — PREALBUMIN: Prealbumin: 26.7 mg/dL (ref 18–38)

## 2021-05-24 LAB — TRIGLYCERIDES: Triglycerides: 104 mg/dL (ref <150)

## 2021-05-24 MED ORDER — PROMETHAZINE HCL 25 MG PO TABS
25.0000 mg | ORAL_TABLET | Freq: Four times a day (QID) | ORAL | Status: DC
  Administered 2021-05-24 – 2021-05-25 (×6): 25 mg via ORAL
  Filled 2021-05-24 (×6): qty 1

## 2021-05-24 MED ORDER — TRACE MINERALS CU-MN-SE-ZN 300-55-60-3000 MCG/ML IV SOLN
INTRAVENOUS | Status: AC
  Filled 2021-05-24: qty 640

## 2021-05-24 MED ORDER — PROMETHAZINE HCL 25 MG PO TABS: 25.0000 mg | ORAL_TABLET | Freq: Four times a day (QID) | ORAL | Status: DC | PRN

## 2021-05-24 MED ORDER — HYDROMORPHONE HCL 1 MG/ML IJ SOLN
1.0000 mg | INTRAMUSCULAR | Status: DC | PRN
  Administered 2021-05-24 – 2021-05-25 (×4): 2 mg via INTRAVENOUS
  Administered 2021-05-25 (×4): 1 mg via INTRAVENOUS
  Administered 2021-05-25 (×2): 2 mg via INTRAVENOUS
  Administered 2021-05-25: 1 mg via INTRAVENOUS
  Filled 2021-05-24: qty 1
  Filled 2021-05-24 (×3): qty 2
  Filled 2021-05-24: qty 1
  Filled 2021-05-24 (×2): qty 2
  Filled 2021-05-24 (×2): qty 1
  Filled 2021-05-24: qty 2
  Filled 2021-05-24: qty 1

## 2021-05-24 MED ORDER — HYDROMORPHONE HCL 2 MG PO TABS: 4.0000 mg | ORAL_TABLET | Freq: Four times a day (QID) | ORAL | Status: DC | PRN

## 2021-05-24 MED ORDER — LEVETIRACETAM 500 MG PO TABS
500.0000 mg | ORAL_TABLET | Freq: Two times a day (BID) | ORAL | Status: DC
  Administered 2021-05-24 – 2021-05-25 (×2): 500 mg via ORAL
  Filled 2021-05-24 (×2): qty 1

## 2021-05-24 MED ORDER — PANTOPRAZOLE SODIUM 40 MG PO TBEC
40.0000 mg | DELAYED_RELEASE_TABLET | Freq: Every day | ORAL | Status: DC
  Administered 2021-05-25: 40 mg via ORAL
  Filled 2021-05-24: qty 1

## 2021-05-24 NOTE — Progress Notes (Signed)
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  Assessment: Recent discharge 6/21 from [facility]>readmitted 6/27 witih c/o L shoulder pain and swelling to BLE and incontinence, weakness, nausea.  Patient is on home TPN due to h/o pancreatitis, Crohn's s/p multiple SBRs, short gut syndrome with other GI comorbidities. TPN was held for a time during her recent previous admission for elevated LFT's that resolved and TPN was resumed 6/19.  PMH: Pancreatitis on chronic TPN, Crohn's s/p multiple SBRs,, short gut syndrome, chronic diarrhea,anasarca, cataracts, h/o diverticulosis, OP, B12 def, PCM, CKD3, chronic pain syndrome, AVN, HTN, anxiety, depression, GERD, ACD, seizures, COVID 11/2020, T2-T5 discitis in March 2022 , ++ more  Glucose / Insulin: 121-204 on SSI. 5 units SSI given. Electrolytes: WNL. CoCa 9.66 Renal: SCr 1.2 down.  Hepatic: ALT 77 remains slightly elevated.. Tbili WNL. Triglycerides 104.  Alb/prealbumin: alb 2.3, Prealbumin 26.7. Intake / Output: UOP unmeasured, LBM 6/28. +witnessed vomiting, not recorded. I/P +1.9L GI Imaging: - 6/28 CT: Large amount of fecal matter and gas within the remaining colon, all the way to the rectum/anus. Cannot rule out a degree of proctitis  GI Surgeries / Procedures: none GI Meds: Chronic diarrhea on: Entocort, Creon 36,000 TID, Protonix 13m/d changed to IV, Sucrealfate TIDWC, Teduglutide daily (for SBS)--family to provide. Significant stool burden- plan Miralax  Central access: PICC line TPN start date: (at home). Inpatient start 6/28>>  Nutritional Goals (per RD recommendation on 6/28: kCal: 1800-2000, Protein: 90-105, Fluid: >= 2 L/day  (PTA formula) 1088 kcal, Protein (15%) 85g, Carb 748kcal , lipids SMOF 60g/d: Na150, K 92, Cl 74, Acetate 216.5, Phos 24, Ca 10, Mg 10, N 13.4, Al 14:  Infusion 18028mover 12 hrs. --last bag Sunday 6/26 PTA.  Goal TPN rate is 80 mL/hr will prvide 96g protein, 57.6g lipids, 288g carbs, 1939 total kcal  Current Nutrition:  TPN Clear liquid diet  Plan:  Convert TPN back to cyclic over 12 hrs as PTA. No change in formula. Standard lytes and trace elements, max acetate Will start SSI and sensitive SSI TPN labs Mon/Thurs and PRN   Tonyetta Berko S. RoAlford HighlandPharmD, BCPS Clinical Staff Pharmacist [email or web address] 05/24/2021 8:00 AM

## 2021-05-24 NOTE — Progress Notes (Signed)
PROGRESS NOTE        PATIENT DETAILS Name: Caitlin Peters Age: 60 y.o. Sex: female Date of Birth: 03-Sep-1961 Admit Date: 05/22/2021 Admitting Physician Evalee Mutton Kristeen Mans, MD BWL:[id number] Everardo Beals, MD  Brief Narrative: Patient is a 60 y.o. female history of Crohn's disease-s/p multiple small bowel resections-with short gut syndrome (chronic diarrhea-4 BMs at baseline)-on TNA at home-recent history of T2-T5 discitis in March 2022 (completed IV Abx)-referred to the ED from ID clinic for evaluation of worsening left shoulder pain for the past few weeks, worsening diarrhea, vomiting and worsening abdominal pain.  She was found to have AKI-and subsequently admitted to the hospitalist service.  ID recommended a MRI of her left shoulder.  Significant events: 6/27>> referred to ED from ID clinic-worsening left shoulder pain-diarrhea/vomiting/abdominal pain-found to have AKI.  Significant studies: 6/28>> MRI left shoulder: No findings of infection-mild supraspinatus tendinopathy. 6/28>> CT abdomen/pelvis: Resolution of previously seen colitis, large amount of fecal matter/gas within the remaining colon.  Antimicrobial therapy: None  Microbiology data: 6/27>> COVID PCR: Negative 6/28>> stool C. difficile: Negative  Procedures : None  Consults: None  DVT Prophylaxis : enoxaparin (LOVENOX) injection 40 mg Start: 05/23/21 1000   Subjective: Some nausea-but tolerating liquids this morning.  Last episode of vomiting was yesterday evening.  Assessment/Plan: AKI on CKD stage II: Improved with supportive care-likely hemodynamically mediated in the setting of GI loss.    History of Crohn's disease-s/p multiple small bowel resections-with resultant short gut syndrome: Has diarrhea at baseline-normally has around 4 BMs daily-Per patient over the past few weeks-has been having around 6-7 BMs daily.  She is compliant to TNA and Gattex.  Remains on budesonide.   CT abdomen as above-C. difficile studies negative-suspicion for overflow diarrhea or bacterial overgrowth syndrome.  GI following-appreciate input-we will await further recommendations today.  Intractable nausea/vomiting: Improved-diet being advanced slowly.  Continue supportive care.  Left shoulder pain: No evidence of septic arthritis-has supraspinatus tendinopathy/degenerative arthritis-we will need supportive care.  History of seizures: On Keppra-switch to oral route as vomiting seems to have improved.   Transaminitis: Had significantly elevated AST/ALT earlier this month-has down trended significantly-we will monitor periodically.  Normocytic anemia: Due to anemia of chronic disease-stable for close monitoring for now.  Chronic pain syndrome: Has mostly chronic abdominal pain-on methadone/Dilaudid at home-due to intractable vomiting-she was unable to take any of oral narcotics-she was on IV Dilaudid-since vomiting has improved-switching back to her usual oral narcotic regimen.  Stop all IV Dilaudid today.   HTN: Was initially unable to take any oral antihypertensives as had intractable vomiting yesterday-switching back to her usual oral antihypertensive regimen.  Remains on as needed IV hydralazine.  Follow BP and adjust accordingly.    GERD: Continue PPI/Carafate  History of depression: Continue Wellbutrin/Cymbalta   Debility/deconditioning: Worsened by acute illness-obtain PT/OT eval.  Diet: Diet Order             DIET SOFT Room service appropriate? Yes; Fluid consistency: Thin  Diet effective now                    Code Status: Full code   Family Communication: None at bedside  Disposition Plan: Status is: Inpatient.  The patient will require care spanning > 2 midnights and should be moved to inpatient because: Inpatient level of care appropriate due to severity of  illness  Dispo: The patient is from: Home              Anticipated d/c is to: Home               Patient currently is not medically stable to d/c.   Difficult to place patient No    Barriers to Discharge: Intractable nausea/vomiting-AKI-slowly improving-diet being advanced.  Antimicrobial agents: Anti-infectives (From admission, onward)    None        Time spent: 25 minutes-Greater than 50% of this time was spent in counseling, explanation of diagnosis, planning of further management, and coordination of care.  MEDICATIONS: Scheduled Meds:  amLODipine  10 mg Oral Daily   budesonide  9 mg Oral Daily   buPROPion  200 mg Oral Daily   carvedilol  25 mg Oral BID WC   Chlorhexidine Gluconate Cloth  6 each Topical Daily   cycloSPORINE  1 drop Both Eyes BID   DULoxetine  90 mg Oral Daily   enoxaparin (LOVENOX) injection  40 mg Subcutaneous Q24H   hydrALAZINE  100 mg Oral TID   insulin aspart  0-9 Units Subcutaneous Q4H   lipase/protease/amylase  36,000 Units Oral TID with meals   methadone  5 mg Oral 5 X Daily   [START ON 05/25/2021] pantoprazole  40 mg Oral Q0600   polyvinyl alcohol  1 drop Both Eyes QID   promethazine  25 mg Oral Q6H   sucralfate  1 g Oral TID WC & HS   Teduglutide (rDNA)  5 mL Subcutaneous Daily   Continuous Infusions:  levETIRAcetam 500 mg (05/24/21 0846)   promethazine (PHENERGAN) injection (IM or IVPB) 25 mg (05/24/21 0928)   TPN ADULT (ION) 80 mL/hr at 34/28/76 8115   TPN CYCLIC-ADULT (ION)     PRN Meds:.hydrALAZINE, HYDROmorphone (DILAUDID) injection, promethazine (PHENERGAN) injection (IM or IVPB), sodium chloride flush   PHYSICAL EXAM: Vital signs: Vitals:   05/24/21 0600 05/24/21 0627 05/24/21 0736 05/24/21 1126  BP:  (!) 186/94 (!) 191/172 (!) 185/101  Pulse: 86  80 88  Resp: 18 20 20 14   Temp:  97.9 F (36.6 C) 98.9 F (37.2 C) 98.6 F (37 C)  TempSrc:  Oral Oral Oral  SpO2: (!) 84%  91% 97%  Weight:       Filed Weights   05/23/21 0230  Weight: 67.1 kg   Body mass index is 22.49 kg/m.   Gen Exam:Alert awake-not in any  distress HEENT:atraumatic, normocephalic Chest: B/L clear to auscultation anteriorly CVS:S1S2 regular Abdomen:soft non tender, non distended Extremities:no edema Neurology: Non focal Skin: no rash   I have personally reviewed following labs and imaging studies  LABORATORY DATA: CBC: Recent Labs  Lab 05/22/21 1515 05/24/21 0355  WBC 5.5 3.4*  NEUTROABS 2.9 1.7  HGB 9.9* 8.2*  HCT 31.2* 26.0*  MCV 96.0 96.3  PLT 225 194     Basic Metabolic Panel: Recent Labs  Lab 05/22/21 1515 05/23/21 0324 05/24/21 0355  NA 138 139 136  K 4.5 4.0 4.4  CL 107 109 109  CO2 23 21* 23  GLUCOSE 75 47* 124*  BUN 36* 35* 22*  CREATININE 1.63* 1.47* 1.20*  CALCIUM 8.7* 8.1* 8.3*  MG  --   --  1.9  PHOS  --   --  2.8     GFR: Estimated Creatinine Clearance: 50.3 mL/min (A) (by C-G formula based on SCr of 1.2 mg/dL (H)).  Liver Function Tests: Recent Labs  Lab 05/22/21 1515 05/23/21  5621 05/24/21 0355  AST 54* 33 25  ALT 127* 93* 77*  ALKPHOS 199* 143* 153*  BILITOT 0.6 0.9 0.6  PROT 6.8 5.5* 5.8*  ALBUMIN 2.9* 2.3* 2.3*    No results for input(s): LIPASE, AMYLASE in the last 168 hours. No results for input(s): AMMONIA in the last 168 hours.  Coagulation Profile: No results for input(s): INR, PROTIME in the last 168 hours.  Cardiac Enzymes: No results for input(s): CKTOTAL, CKMB, CKMBINDEX, TROPONINI in the last 168 hours.  BNP (last 3 results) No results for input(s): PROBNP in the last 8760 hours.  Lipid Profile: Recent Labs    05/24/21 0355  TRIG 104    Thyroid Function Tests: No results for input(s): TSH, T4TOTAL, FREET4, T3FREE, THYROIDAB in the last 72 hours.  Anemia Panel: No results for input(s): VITAMINB12, FOLATE, FERRITIN, TIBC, IRON, RETICCTPCT in the last 72 hours.  Urine analysis:    Component Value Date/Time   COLORURINE YELLOW 05/23/2021 [place] 05/23/2021 1718   LABSPEC 1.015 05/23/2021 1718   PHURINE 5.0 05/23/2021  1718   GLUCOSEU NEGATIVE 05/23/2021 1718   HGBUR NEGATIVE 05/23/2021 1718   BILIRUBINUR NEGATIVE 05/23/2021 1718   KETONESUR 80 (A) 05/23/2021 1718   PROTEINUR 100 (A) 05/23/2021 1718   NITRITE NEGATIVE 05/23/2021 1718   LEUKOCYTESUR NEGATIVE 05/23/2021 1718    Sepsis Labs: Lactic Acid, Venous    Component Value Date/Time   LATICACIDVEN 0.7 05/06/2021 2010    MICROBIOLOGY: Recent Results (from the past 240 hour(s))  SARS CORONAVIRUS 2 (TAT 6-24 HRS) Nasopharyngeal Nasopharyngeal Swab     Status: None   Collection Time: 05/22/21  9:45 PM   Specimen: Nasopharyngeal Swab  Result Value Ref Range Status   SARS Coronavirus 2 NEGATIVE NEGATIVE Final    Comment: (NOTE) SARS-CoV-2 target nucleic acids are NOT DETECTED.  The SARS-CoV-2 RNA is generally detectable in upper and lower respiratory specimens during the acute phase of infection. Negative results do not preclude SARS-CoV-2 infection, do not rule out co-infections with other pathogens, and should not be used as the sole basis for treatment or other patient management decisions. Negative results must be combined with clinical observations, patient history, and epidemiological information. The expected result is Negative.  Fact Sheet for Patients: SugarRoll.be  Fact Sheet for Healthcare Providers: https://www.woods-mathews.com/  This test is not yet approved or cleared by the [place] FDA and  has been authorized for detection and/or diagnosis of SARS-CoV-2 by FDA under an Emergency Use Authorization (EUA). This EUA will remain  in effect (meaning this test can be used) for the duration of the COVID-19 declaration under Se ction 564(b)(1) of the Act, 21 U.S.C. section 360bbb-3(b)(1), unless the authorization is terminated or revoked sooner.  Performed at [facility], [place] [address] [place], [place] [address]   C Difficile Quick Screen w PCR reflex     Status: None    Collection Time: 05/23/21 12:34 PM   Specimen: STOOL  Result Value Ref Range Status   C Diff antigen NEGATIVE NEGATIVE Final   C Diff toxin NEGATIVE NEGATIVE Final   C Diff interpretation No C. difficile detected.  Final    Comment: Performed at [facility], [place] [address] [place], [place] [address]    RADIOLOGY STUDIES/RESULTS: CT ABDOMEN PELVIS WO CONTRAST  Result Date: 05/23/2021 CLINICAL DATA:  Abdominal pain of acute nature. Short gut syndrome. Chronic diarrhea. Crohn's disease. EXAM: CT ABDOMEN AND PELVIS WITHOUT CONTRAST TECHNIQUE: Multidetector CT  imaging of the abdomen and pelvis was performed following the standard protocol without IV contrast. COMPARISON:  Ultrasound 13 days ago.  CT 04/07/2021 FINDINGS: Lower chest: Lung bases are clear. Hepatobiliary: Pneumobilia as seen chronically. Previous cholecystectomy. No focal parenchymal finding. Pancreas: No pancreatic abnormality seen. Spleen: Normal Adrenals/Urinary Tract: Adrenal glands are normal. Kidneys appear normal without contrast. Bladder is full but otherwise unremarkable. Stomach/Bowel: Multiple previous surgical procedures affecting the small intestine. Previous partial colectomy. Large amount of fecal matter and gas within the remaining colon. Previously seen findings of wall thickening in the sigmoid region are resolved. There could possibly be some ongoing proctitis. Vascular/Lymphatic: Venous catheter in place from the right groin has its tip in the IVC below the diaphragm. Aorta appears unremarkable. Reproductive: Previous hysterectomy.  No pelvic mass. Other: No free fluid or air. Musculoskeletal: Ordinary lower lumbar degenerative changes. IMPRESSION: Resolution of the previously seen colitis pattern. Large amount of fecal matter and gas within the remaining colon, all the way to the rectum/anus. Cannot rule out a degree of proctitis. Electronically Signed   By: [name] Chimes M.D.   On: 05/23/2021 10:16   MR SHOULDER  LEFT W WO CONTRAST  Result Date: 05/23/2021 CLINICAL DATA:  Left shoulder pain, T3-4 thoracic discitis, fever and abdominal pain, history of Crohn's disease. EXAM: MRI OF THE LEFT SHOULDER WITHOUT AND WITH CONTRAST TECHNIQUE: Multiplanar, multisequence MR imaging of the left shoulder was performed before and after the administration of intravenous contrast. CONTRAST:  6.72m GADAVIST GADOBUTROL 1 MMOL/ML IV SOLN COMPARISON:  None. FINDINGS: Despite efforts by the technologist and patient, motion artifact is present on today's exam and could not be eliminated. This reduces exam sensitivity and specificity. Rotator cuff:  Mild distal supraspinatus tendinopathy anteriorly. Muscles:  Unremarkable Biceps long head:  Unremarkable Acromioclavicular Joint: No significant arthropathy. Type II acromion. No significant regional bursitis. Glenohumeral Joint: Mild degenerative chondral thinning. No joint effusion or substantial synovitis. Labrum:  Grossly unremarkable Bones: No significant extra-articular osseous abnormalities identified. Other: Small left axillary lymph nodes are present. IMPRESSION: 1. Mild supraspinatus tendinopathy. 2. No findings of regional infection. 3. Small left axillary lymph nodes. 4. Mild degenerative glenohumeral chondral thinning. Electronically Signed   By: WVan ClinesM.D.   On: 05/23/2021 08:07     LOS: 1 day   SOren Binet MD  [facility]    To contact the attending provider between 7A-7P or the covering provider during after hours 7P-7A, please log into the web site [email or web address] and access using universal [place] password for that web site. If you do not have the password, please call the hospital operator.  05/24/2021, 2:50 PM

## 2021-05-24 NOTE — Progress Notes (Addendum)
Daily Rounding Note  05/24/2021, 11:49 AM  LOS: 1 day   SUBJECTIVE:   Chief complaint:  chronic nausea, vomiting, diarrhea all acutely worse.    Nausea better.  Stools loose, watery.  No abd pain.  Overall felling better and ready to try solid food.  OBJECTIVE:         Vital signs in last 24 hours:    Temp:  [97.5 F (36.4 C)-98.9 F (37.2 C)] 98.6 F (37 C) (06/29 1126) Pulse Rate:  [80-93] 88 (06/29 1126) Resp:  [13-22] 14 (06/29 1126) BP: (151-191)/(54-172) 185/101 (06/29 1126) SpO2:  [81 %-100 %] 97 % (06/29 1126) Last BM Date: 05/23/21 Filed Weights   05/23/21 0230  Weight: 67.1 kg   General: NAD, comfortable not toxic.     Heart: RRR Chest: clear.  No laobred breathing or cough Abdomen: soft, mild diffuse tenderness.  Hyperactive BS  Extremities: no CCE Neuro/Psych:  cooperative.  Alert.  Oriented x  3.  + involuntary truncal body movements  Intake/Output from previous day: 06/28 0701 - 06/29 0700 In: 1972.7 [I.V.:1772.7; IV Piggyback:200.1] Out: -   Intake/Output this shift: No intake/output data recorded.  Lab Results: Recent Labs    05/22/21 1515 05/24/21 0355  WBC 5.5 3.4*  HGB 9.9* 8.2*  HCT 31.2* 26.0*  PLT 225 194   BMET Recent Labs    05/22/21 1515 05/23/21 0324 05/24/21 0355  NA 138 139 136  K 4.5 4.0 4.4  CL 107 109 109  CO2 23 21* 23  GLUCOSE 75 47* 124*  BUN 36* 35* 22*  CREATININE 1.63* 1.47* 1.20*  CALCIUM 8.7* 8.1* 8.3*   LFT Recent Labs    05/22/21 1515 05/23/21 0324 05/24/21 0355  PROT 6.8 5.5* 5.8*  ALBUMIN 2.9* 2.3* 2.3*  AST 54* 33 25  ALT 127* 93* 77*  ALKPHOS 199* 143* 153*  BILITOT 0.6 0.9 0.6   PT/INR No results for input(s): LABPROT, INR in the last 72 hours. Hepatitis Panel No results for input(s): HEPBSAG, HCVAB, HEPAIGM, HEPBIGM in the last 72 hours.  Studies/Results: CT ABDOMEN PELVIS WO CONTRAST  Result Date: 05/23/2021 CLINICAL DATA:   Abdominal pain of acute nature. Short gut syndrome. Chronic diarrhea. Crohn's disease. EXAM: CT ABDOMEN AND PELVIS WITHOUT CONTRAST TECHNIQUE: Multidetector CT imaging of the abdomen and pelvis was performed following the standard protocol without IV contrast. COMPARISON:  Ultrasound 13 days ago.  CT 04/07/2021 FINDINGS: Lower chest: Lung bases are clear. Hepatobiliary: Pneumobilia as seen chronically. Previous cholecystectomy. No focal parenchymal finding. Pancreas: No pancreatic abnormality seen. Spleen: Normal Adrenals/Urinary Tract: Adrenal glands are normal. Kidneys appear normal without contrast. Bladder is full but otherwise unremarkable. Stomach/Bowel: Multiple previous surgical procedures affecting the small intestine. Previous partial colectomy. Large amount of fecal matter and gas within the remaining colon. Previously seen findings of wall thickening in the sigmoid region are resolved. There could possibly be some ongoing proctitis. Vascular/Lymphatic: Venous catheter in place from the right groin has its tip in the IVC below the diaphragm. Aorta appears unremarkable. Reproductive: Previous hysterectomy.  No pelvic mass. Other: No free fluid or air. Musculoskeletal: Ordinary lower lumbar degenerative changes. IMPRESSION: Resolution of the previously seen colitis pattern. Large amount of fecal matter and gas within the remaining colon, all the way to the rectum/anus. Cannot rule out a degree of proctitis. Electronically Signed   By: [name] Chimes M.D.   On: 05/23/2021 10:16   MR SHOULDER LEFT W WO  CONTRAST  Result Date: 05/23/2021 CLINICAL DATA:  Left shoulder pain, T3-4 thoracic discitis, fever and abdominal pain, history of Crohn's disease. EXAM: MRI OF THE LEFT SHOULDER WITHOUT AND WITH CONTRAST TECHNIQUE: Multiplanar, multisequence MR imaging of the left shoulder was performed before and after the administration of intravenous contrast. CONTRAST:  6.14m GADAVIST GADOBUTROL 1 MMOL/ML IV SOLN  COMPARISON:  None. FINDINGS: Despite efforts by the technologist and patient, motion artifact is present on today's exam and could not be eliminated. This reduces exam sensitivity and specificity. Rotator cuff:  Mild distal supraspinatus tendinopathy anteriorly. Muscles:  Unremarkable Biceps long head:  Unremarkable Acromioclavicular Joint: No significant arthropathy. Type II acromion. No significant regional bursitis. Glenohumeral Joint: Mild degenerative chondral thinning. No joint effusion or substantial synovitis. Labrum:  Grossly unremarkable Bones: No significant extra-articular osseous abnormalities identified. Other: Small left axillary lymph nodes are present. IMPRESSION: 1. Mild supraspinatus tendinopathy. 2. No findings of regional infection. 3. Small left axillary lymph nodes. 4. Mild degenerative glenohumeral chondral thinning. Electronically Signed   By: WVan ClinesM.D.   On: 05/23/2021 08:07    Scheduled Meds:  amLODipine  10 mg Oral Daily   budesonide  9 mg Oral Daily   buPROPion  200 mg Oral Daily   carvedilol  25 mg Oral BID WC   Chlorhexidine Gluconate Cloth  6 each Topical Daily   cycloSPORINE  1 drop Both Eyes BID   DULoxetine  90 mg Oral Daily   enoxaparin (LOVENOX) injection  40 mg Subcutaneous Q24H   hydrALAZINE  100 mg Oral TID   insulin aspart  0-9 Units Subcutaneous Q4H   lipase/protease/amylase  36,000 Units Oral TID with meals   methadone  5 mg Oral 5 X Daily   pantoprazole (PROTONIX) IV  40 mg Intravenous Q12H   polyvinyl alcohol  1 drop Both Eyes QID   sucralfate  1 g Oral TID WC & HS   Teduglutide (rDNA)  5 mL Subcutaneous Daily   Continuous Infusions:  levETIRAcetam 500 mg (05/24/21 0846)   promethazine (PHENERGAN) injection (IM or IVPB) 25 mg (05/24/21 0928)   TPN ADULT (ION) 80 mL/hr at 017/49/4419675  TPN CYCLIC-ADULT (ION)     PRN Meds:.hydrALAZINE, HYDROmorphone (DILAUDID) injection, promethazine (PHENERGAN) injection (IM or IVPB), sodium  chloride flush   ASSESMENT:     Acute on chronic N/V, diarrhea.  Large stool burden in colon per CT.   Hx Crohn's, s/p multiple SB surgeries w resulting short gut/malabsorption.  On TNA.  Colonoscopy, biopsies 10/2020 w/o active disease.  Chronic diarrhea on Gatex since 11/2020, Budesonide 9 mg daily.    Abundant stool, no evidence active Crohn's on CT. Possible SIBO or overflow diarrhea.  C diff negative.   Antidiarrheals on hold.     Plan is for her to fup w [facility] intestinal transplant program, where she is established w Dr JDomenica Fail   .      Elevated LFTs in setting of Sepsis, improved from last month.       Chronic anemia.  Normal MCV at 96.     PLAN     Diet soft.  Skip full liquids as these are dairy heavy and she avoids dairy.      Switch back to Phenergan po (her home med).  Will order q 6 h to assure she gets meds in timely manner but d/w pt and she can decline med if she does not need this.      [facility]      Caitlin Peters  05/24/2021, 11:49 AM Phone [phone]    Attending physician's note   I have taken an interval history, reviewed the chart and examined the patient. I agree with the Advanced Practitioner's note, impression and recommendations.    Feeling better, she is tolerating diet She is starting to pass gas and have bowel movement with MiraLAX bowel purge  Advance diet as tolerated  Continue MiraLAX 1 capful twice daily as needed  Follow-up with [facility] in July as scheduled  Elevated transaminases secondary to TPN, continue to monitor  GI is available on standby, please call with any questions   I have spent 25 minutes of patient care (this includes precharting, chart review, review of results, face-to-face time used for counseling as well as treatment plan and follow-up. The patient was provided an opportunity to ask questions and all were answered. The patient agreed with the plan and demonstrated an understanding of the  instructions.  Damaris Hippo , MD [phone]

## 2021-05-24 NOTE — Evaluation (Signed)
Physical Therapy Evaluation and Discharge Patient Details Name: Caitlin Peters MRN: [id number] DOB: [birth year] Today's Date: 05/24/2021   History of Present Illness  Pt is a 60 y/o female admitted 6/27 secondary to increased L shoulder pain, nausea, and vomiting. MRI of L shoulder negative for acute infection. PMH includes chrons disease, short gut syndrome, pancreatitis, and thoracic discitis.  Clinical Impression  Patient evaluated by Physical Therapy with no further acute PT needs identified. All education has been completed and the patient has no further questions. Pt overall steady with mobility tasks this session. No LOB noted. Educated about safety with stair navigation at home. Pt's rollator is broken, so recommending new one for d/c for increase safety with mobility. Also would benefit from use of 3 in 1 as shower seat. Pt has all necessary assist at home. Educated about mobilizing with staff to maintain independence. See below for any follow-up Physical Therapy or equipment needs. PT is signing off. Thank you for this referral. If needs change, please re-consult.      Follow Up Recommendations No PT follow up    Equipment Recommendations  3in1 (PT);Other (comment) (rollator (4 wheeled walker with seat))    Recommendations for Other Services       Precautions / Restrictions Precautions Precautions: None Restrictions Weight Bearing Restrictions: No      Mobility  Bed Mobility Overal bed mobility: Independent                  Transfers Overall transfer level: Modified independent Equipment used: None                Ambulation/Gait Ambulation/Gait assistance: Modified independent (Device/Increase time) Gait Distance (Feet): 400 Feet Assistive device: IV Pole Gait Pattern/deviations: Step-through pattern;Decreased stride length Gait velocity: WFL   General Gait Details: Overall steady gait. No LOB noted. Good gait speed. Educated about walking program  to perform at home.  Stairs Stairs:  (Discussed safe stair navigation with pt.)          Wheelchair Mobility    Modified Rankin (Stroke Patients Only)       Balance Overall balance assessment: No apparent balance deficits (not formally assessed)                                           Pertinent Vitals/Pain Pain Assessment: Faces Faces Pain Scale: Hurts little more Pain Location: L shoulder Pain Descriptors / Indicators: Discomfort;Sore Pain Intervention(s): Limited activity within patient's tolerance;Monitored during session;Repositioned    Home Living Family/patient expects to be discharged to:: Private residence Living Arrangements: Children Available Help at Discharge: Family;Available PRN/intermittently Type of Home: Apartment Home Access: Stairs to enter Entrance Stairs-Rails: Left Entrance Stairs-Number of Steps: 12 Home Layout: One level Home Equipment: Walker - 4 wheels;Walker - 2 wheels (her rollator is broken)      Prior Function Level of Independence: Needs assistance   Gait / Transfers Assistance Needed: In the house does not use AD; uses rollator outside or in community and has supervision when outisde  ADL's / [employer] Assistance Needed: Reports independent with ADLs except has supervision for bathing for safety        Hand Dominance        Extremity/Trunk Assessment   Upper Extremity Assessment Upper Extremity Assessment: Defer to OT evaluation    Lower Extremity Assessment Lower Extremity Assessment: Overall WFL for tasks assessed  Cervical / Trunk Assessment Cervical / Trunk Assessment: Normal  Communication   Communication: No difficulties  Cognition Arousal/Alertness: Awake/alert Behavior During Therapy: WFL for tasks assessed/performed Overall Cognitive Status: Within Functional Limits for tasks assessed                                        General Comments      Exercises      Assessment/Plan    PT Assessment Patent does not need any further PT services  PT Problem List         PT Treatment Interventions      PT Goals (Current goals can be found in the Care Plan section)  Acute Rehab PT Goals Patient Stated Goal: return home PT Goal Formulation: With patient Time For Goal Achievement: 05/24/21 Potential to Achieve Goals: Good    Frequency     Barriers to discharge        Co-evaluation               AM-PAC PT "6 Clicks" Mobility  Outcome Measure Help needed turning from your back to your side while in a flat bed without using bedrails?: None Help needed moving from lying on your back to sitting on the side of a flat bed without using bedrails?: None Help needed moving to and from a bed to a chair (including a wheelchair)?: None Help needed standing up from a chair using your arms (e.g., wheelchair or bedside chair)?: None Help needed to walk in hospital room?: None Help needed climbing 3-5 steps with a railing? : A Little 6 Click Score: 23    End of Session   Activity Tolerance: Patient tolerated treatment well Patient left: in bed;with bed alarm set Nurse Communication: Mobility status PT Visit Diagnosis: Other abnormalities of gait and mobility (R26.89)    Time: 6789-3810 PT Time Calculation (min) (ACUTE ONLY): 23 min   Charges:   PT Evaluation $PT Eval Low Complexity: 1 Low PT Treatments $Gait Training: 8-22 mins        Lou Miner, DPT  Acute Rehabilitation Services  Pager: [phone] Office: [phone]   Rudean Hitt 05/24/2021, 11:01 AM

## 2021-05-24 NOTE — TOC Progression Note (Signed)
Transition of Care [facility]) - Progression Note    Patient Details  Name: Caitlin Peters MRN: [id number] Date of Birth: May 19, 1961  [facility] CM/SW Contact  Loletha Grayer Beverely Pace, RN Phone Number: 05/24/2021, 8:25 AM  Clinical Narrative:  Case manager spoke with patient to discuss need to get a different [place]. Patient was disappointed that she had to change but very appreciative of having opportunity to discuss. Referral called to Candi Leash, Liaison for [place] (formerly [facility]), they will accept patient.      Expected Discharge Plan: [place] Barriers to Discharge: Continued Medical Work up  Expected Discharge Plan and Services Expected Discharge Plan: [place] In-house Referral: NA Discharge Planning Services: CM Consult Post Acute Care Choice: [place] arrangements for the past 2 months: Single Family Home                   DME Agency: NA         HH Agency: [place] Date [place]: 05/24/21 Time [place]: [phone] Representative spoke with at [place]: [place] ([place]) Interventions    Readmission Risk Interventions Readmission Risk Prevention Plan 02/10/2021 12/21/2020 11/09/2020  Transportation Screening Complete Complete Complete  PCP or Specialist Appt within 3-5 Days - - -  Not Complete comments - - -  [place] or [place] Work Consult for [place] Planning/Counseling - - -  SW consult not completed comments - - -  Palliative Care Screening - - -  Medication Review Press photographer) Complete - Complete  PCP or Specialist appointment within 3-5 days of discharge - Complete Complete  HRI or Home Care Consult Complete Complete Complete  SW Recovery Care/Counseling Consult Complete Complete -  Palliative Care Screening Not Applicable - Not [place] Not  Applicable - Not Applicable

## 2021-05-24 NOTE — Telephone Encounter (Signed)
Caitlin Peters Patient is calling in wanting to know if Dr. Jerilee Hoh will follow [place] orders once the pt has be discharged from the hospital in a few days.

## 2021-05-25 DIAGNOSIS — R531 Weakness: Secondary | ICD-10-CM | POA: Diagnosis not present

## 2021-05-25 DIAGNOSIS — R1010 Upper abdominal pain, unspecified: Secondary | ICD-10-CM

## 2021-05-25 DIAGNOSIS — N179 Acute kidney failure, unspecified: Secondary | ICD-10-CM | POA: Diagnosis not present

## 2021-05-25 LAB — COMPREHENSIVE METABOLIC PANEL
ALT: 55 U/L — ABNORMAL HIGH (ref 0–44)
AST: 23 U/L (ref 15–41)
Albumin: 2.2 g/dL — ABNORMAL LOW (ref 3.5–5.0)
Alkaline Phosphatase: 127 U/L — ABNORMAL HIGH (ref 38–126)
Anion gap: 4 — ABNORMAL LOW (ref 5–15)
BUN: 27 mg/dL — ABNORMAL HIGH (ref 6–20)
CO2: 27 mmol/L (ref 22–32)
Calcium: 8.9 mg/dL (ref 8.9–10.3)
Chloride: 108 mmol/L (ref 98–111)
Creatinine, Ser: 1.27 mg/dL — ABNORMAL HIGH (ref 0.44–1.00)
GFR, Estimated: 48 mL/min — ABNORMAL LOW (ref >60)
Glucose, Bld: 126 mg/dL — ABNORMAL HIGH (ref 70–99)
Potassium: 4.5 mmol/L (ref 3.5–5.1)
Sodium: 139 mmol/L (ref 135–145)
Total Bilirubin: 0.3 mg/dL (ref 0.3–1.2)
Total Protein: 5.3 g/dL — ABNORMAL LOW (ref 6.5–8.1)

## 2021-05-25 LAB — GLUCOSE, CAPILLARY
Glucose-Capillary: 137 mg/dL — ABNORMAL HIGH (ref 70–99)
Glucose-Capillary: 109 mg/dL — ABNORMAL HIGH (ref 70–99)
Glucose-Capillary: 160 mg/dL — ABNORMAL HIGH (ref 70–99)
Glucose-Capillary: 108 mg/dL — ABNORMAL HIGH (ref 70–99)
Glucose-Capillary: 115 mg/dL — ABNORMAL HIGH (ref 70–99)

## 2021-05-25 LAB — MAGNESIUM: Magnesium: 1.8 mg/dL (ref 1.7–2.4)

## 2021-05-25 LAB — PHOSPHORUS: Phosphorus: 2.6 mg/dL (ref 2.5–4.6)

## 2021-05-25 MED ORDER — HEPARIN SOD (PORK) LOCK FLUSH 100 UNIT/ML IV SOLN
250.0000 [IU] | INTRAVENOUS | Status: AC | PRN
  Administered 2021-05-25: 250 [IU]
  Filled 2021-05-25: qty 2.5

## 2021-05-25 MED ORDER — HEPARIN SOD (PORK) LOCK FLUSH 100 UNIT/ML IV SOLN
250.0000 [IU] | INTRAVENOUS | Status: AC | PRN
  Administered 2021-05-25: 500 [IU]
  Filled 2021-05-25: qty 2.5

## 2021-05-25 MED ORDER — TRACE MINERALS CU-MN-SE-ZN 300-55-60-3000 MCG/ML IV SOLN
INTRAVENOUS | Status: DC
  Filled 2021-05-25: qty 640

## 2021-05-25 NOTE — Telephone Encounter (Signed)
Verbal orders given to [facility] w/Medi [facility]iner

## 2021-05-25 NOTE — Discharge Instructions (Signed)
Follow with Primary MD [name], [name], MD also follow-up with your primary gastroenterologist and ID physician Dr. Graylon Good in 7 days   Get CBC, CMP, 2 view Chest X ray -  checked next visit within 1 week by Primary MD    Activity: As tolerated with Full fall precautions use walker/cane & assistance as needed  Disposition Home    Diet: Soft  Special Instructions: If you have smoked or chewed Tobacco  in the last 2 yrs please stop smoking, stop any regular Alcohol  and or any Recreational drug use.  On your next visit with your primary care physician please Get Medicines reviewed and adjusted.  Please request your Prim.MD to go over all Hospital Tests and Procedure/Radiological results at the follow up, please get all Hospital records sent to your Prim MD by signing hospital release before you go home.  If you experience worsening of your admission symptoms, develop shortness of breath, life threatening emergency, suicidal or homicidal thoughts you must seek medical attention immediately by calling 911 or calling your MD immediately  if symptoms less severe.  You Must read complete instructions/literature along with all the possible adverse reactions/side effects for all the Medicines you take and that have been prescribed to you. Take any new Medicines after you have completely understood and accpet all the possible adverse reactions/side effects.

## 2021-05-25 NOTE — Discharge Summary (Signed)
Caitlin Peters:[id number] DOB: 03/18/1961 DOA: 05/22/2021  PCP: [name], [name], MD  Admit date: 05/22/2021  Discharge date: 05/25/2021  Admitted From: Home  Disposition:  Home   Recommendations for Outpatient Follow-up:   Follow up with PCP in 1-2 weeks  PCP Please obtain BMP/CBC, 2 view CXR in 1week,  (see Discharge instructions)   PCP Please follow up on the following pending results:    Home Health: PT, RN Equipment/Devices: None  Consultations: GI Discharge Condition: Stable    CODE STATUS: Full    Diet Recommendation: Heart Healthy + TNA  Diet Order             Diet - low sodium heart healthy           DIET SOFT Room service appropriate? Yes; Fluid consistency: Thin  Diet effective now                   CC - L. Shoulder pain  Brief history of present illness from the day of admission and additional interim summary    Patient is a 60 y.o. female history of Crohn's disease-s/p multiple small bowel resections-with short gut syndrome (chronic diarrhea-4 BMs at baseline)-on TNA at home-recent history of T2-T5 discitis in March 2022 (completed IV Abx)-referred to the ED from ID clinic for evaluation of worsening left shoulder pain for the past few weeks, worsening diarrhea, vomiting and worsening abdominal pain.  She was found to have AKI-and subsequently admitted to the hospitalist service.  ID recommended a MRI of her left shoulder.   Significant events: 6/27>> referred to ED from ID clinic-worsening left shoulder pain-diarrhea/vomiting/abdominal pain-found to have AKI.   Significant studies: 6/28>> MRI left shoulder: No findings of infection-mild supraspinatus tendinopathy. 6/28>> CT abdomen/pelvis: Resolution of previously seen colitis, large amount of fecal matter/gas within the  remaining colon.   Antimicrobial therapy: None   Microbiology data: 6/27>> COVID PCR: Negative 6/28>> stool C. difficile: Negative                                                                   Hospital Course    AKI on CKD stage II: Improved with supportive care-likely hemodynamically mediated in the setting of GI loss. Now at baseline.   History of Crohn's disease-s/p multiple small bowel resections-with resultant short gut syndrome: Has diarrhea at baseline-normally has around 4 BMs daily-Per patient over the past few weeks-has been having around 6-7 BMs daily.  She is compliant to TNA and Gattex.  Remains on budesonide.  CT abdomen as above-C. difficile studies negative-suspicion for overflow diarrhea or bacterial overgrowth syndrome.  With supportive care she is much improved continue present regimen follow-up with primary GI within a week of discharge and PCP.   Intractable nausea/vomiting: Improved-diet being advanced slowly.  Continue supportive care.   Left shoulder pain: No evidence of septic arthritis-has supraspinatus tendinopathy/degenerative arthritis-MRI left shoulder nonacute, passive range of motion on the day of discharge is very good without any painful inhibition when she was distracted.  On high doses of narcotics at home, currently no sign of any acute infection or injury to the left shoulder.  Continue supportive care with outpatient PCP follow-up, work on tapering down outpatient narcotics.   History of seizures: On Keppra-switch to oral route as vomiting seems to have improved.    Transaminitis: Had significantly elevated AST/ALT earlier this month-has down trended significantly-PCP to monitor periodically.   Normocytic anemia: Due to anemia of chronic disease-stable for close monitoring for now.   Chronic pain syndrome: Continue home regimen but continue working on cutting down narcotics gradually in the outpatient setting.   HTN: Was initially unable to take  any oral antihypertensives as had intractable vomiting yesterday-switching back to her usual oral antihypertensive regimen.  Remains on as needed IV hydralazine.  Follow BP and adjust accordingly.     GERD: Continue PPI/Carafate   History of depression: Continue Wellbutrin/Cymbalta   Debility/deconditioning: We will get home PT and RN.  Discharge diagnosis     Principal Problem:   Abdominal pain Active Problems:   Crohn's disease of small & large intestine with SGS   AKI (acute kidney injury) ([place])   LFTs abnormal   Left shoulder pain   Chronic idiopathic constipation    Discharge instructions    Discharge Instructions     Diet - low sodium heart healthy   Complete by: As directed    Discharge instructions   Complete by: As directed    Follow with Primary MD [name], [name], MD also follow-up with your primary gastroenterologist and ID physician Dr. Graylon Good in 7 days   Get CBC, CMP, 2 view Chest X ray -  checked next visit within 1 week by Primary MD    Activity: As tolerated with Full fall precautions use walker/cane & assistance as needed  Disposition Home    Diet: Soft  Special Instructions: If you have smoked or chewed Tobacco  in the last 2 yrs please stop smoking, stop any regular Alcohol  and or any Recreational drug use.  On your next visit with your primary care physician please Get Medicines reviewed and adjusted.  Please request your Prim.MD to go over all Hospital Tests and Procedure/Radiological results at the follow up, please get all Hospital records sent to your Prim MD by signing hospital release before you go home.  If you experience worsening of your admission symptoms, develop shortness of breath, life threatening emergency, suicidal or homicidal thoughts you must seek medical attention immediately by calling 911 or calling your MD immediately  if symptoms less severe.  You Must read complete instructions/literature along with all the  possible adverse reactions/side effects for all the Medicines you take and that have been prescribed to you. Take any new Medicines after you have completely understood and accpet all the possible adverse reactions/side effects.   Increase activity slowly   Complete by: As directed        Discharge Medications   Allergies as of 05/25/2021       Reactions   Meperidine Hives   Other reaction(s): GI Upset Due to [place]   Hyoscyamine Hives, Swelling   Legs swelling Disorientation   Cefepime Other (See Comments)   Neurotoxicity occurring in setting of AKI. Ceftriaxone tolerated during same admit  Gabapentin Other (See Comments)   unknown   Lyrica [pregabalin] Other (See Comments)   unknown   Topamax [topiramate] Other (See Comments)   unknown   Zosyn [piperacillin Sod-tazobactam So]    Patient reports it makes her vomit, her neck stiff, and her "heart feel funny"   Fentanyl Rash   Pt is allergic to fentanyl patch related to the glue (gives her a rash) Pt states she is NOT allergic to fentanyl IV medicine   Morphine And Related Rash        Medication List     TAKE these medications    acetaminophen 325 MG tablet Commonly known as: TYLENOL Take 650 mg by mouth every 6 (six) hours as needed for mild pain.   amLODipine 10 MG tablet Commonly known as: NORVASC Take 10 mg by mouth daily.   budesonide 3 MG 24 hr capsule Commonly known as: ENTOCORT EC Take 3 capsules (9 mg total) by mouth daily.   buPROPion 100 MG 12 hr tablet Commonly known as: WELLBUTRIN SR Take 200 mg by mouth daily.   Calcium 200 MG Tabs Take 200 mg by mouth daily.   carvedilol 25 MG tablet Commonly known as: COREG Take 1 tablet (25 mg total) by mouth 2 (two) times daily with a meal.   cholecalciferol 25 MCG (1000 UNIT) tablet Commonly known as: VITAMIN D3 Take 1,000 Units by mouth daily.   cycloSPORINE 0.05 % ophthalmic emulsion Commonly known as: RESTASIS Place 1 drop into both eyes 2  (two) times daily.   denosumab 60 MG/ML Sosy injection Commonly known as: PROLIA Inject 60 mg into the skin every 6 (six) months.   dexlansoprazole 60 MG capsule Commonly known as: Dexilant Take 1 capsule (60 mg total) by mouth daily.   Dextran 70-Hypromellose 0.1-0.3 % Soln Place 1 drop into both eyes 4 (four) times daily.   diphenoxylate-atropine 2.5-0.025 MG tablet Commonly known as: LOMOTIL TAKE 1 TABLET BY MOUTH 4 TIMES DAILY AS NEEDED FOR DIARRHEA OR  LOOSE  STOOLS What changed:  how much to take how to take this when to take this reasons to take this additional instructions   DULoxetine 30 MG capsule Commonly known as: CYMBALTA TAKE 3 CAPSULES BY MOUTH ONCE DAILY   estradiol 2 MG tablet Commonly known as: ESTRACE Take 1 tablet (2 mg total) by mouth daily.   famotidine 20 MG tablet Commonly known as: PEPCID Take 20 mg by mouth daily as needed for heartburn or indigestion.   Gattex 5 MG Kit Generic drug: Teduglutide (rDNA) Inject 5 mLs into the skin daily.   hydrALAZINE 100 MG tablet Commonly known as: APRESOLINE Take 1 tablet (100 mg total) by mouth 3 (three) times daily.   HYDROmorphone 4 MG tablet Commonly known as: DILAUDID Take 4 mg by mouth in the morning, at noon, in the evening, and at bedtime.   ICY HOT BACK EX Apply 1 application topically daily as needed (back pain).   levETIRAcetam 500 MG tablet Commonly known as: KEPPRA Take 500 mg by mouth 2 (two) times daily.   lipase/protease/amylase 36000 UNITS Cpep capsule Commonly known as: Creon Take 1 capsule (36,000 Units total) by mouth 3 (three) times daily as needed (with meals for digestion). What changed: when to take this   loperamide 2 MG capsule Commonly known as: IMODIUM TAKE 1 CAPSULE BY MOUTH AS NEEDED FOR DIARRHEA OR LOOSE STOOLS What changed:  how much to take how to take this when to take this reasons to take this  additional instructions   methadone 5 MG tablet Commonly  known as: DOLOPHINE Take 1 tablet (5 mg total) by mouth 5 (five) times daily. What changed:  when to take this reasons to take this   MULTIVITAMIN ADULT PO Take 1 tablet by mouth daily.   Narcan 4 MG/0.1ML Liqd nasal spray kit Generic drug: naloxone Place 1 spray into the nose once as needed (overdose).   PRESCRIPTION MEDICATION Inject 1 each into the vein daily. Home TPN . [facility] . 1 bag for 12 hours. [phone]   PROBIOTIC-10 PO Take 1 capsule by mouth daily.   promethazine 25 MG tablet Commonly known as: PHENERGAN TAKE 1 TABLET BY MOUTH EVERY 6 HOURS AS NEEDED FOR NAUSEA FOR VOMITING What changed: See the new instructions.   sodium chloride 0.9 % infusion Inject 1 mL into the vein daily as needed (flush).   sucralfate 1 GM/10ML suspension Commonly known as: Carafate Take 10 mLs (1 g total) by mouth 4 (four) times daily -  with meals and at bedtime.   TRALEMENT IV Inject 1 mL into the vein See admin instructions. Used in TPN bag 4 times weekly   vitamin B-12 1000 MCG tablet Commonly known as: CYANOCOBALAMIN Take 1,000 mcg by mouth daily.         Follow-up [facility] Follow up.   Why: A representative from [facility] will contact you to arrange start date and time for your services. Contact information: [address] [address] [phone]         [name], [name], MD. Schedule an appointment as soon as possible for a visit in 1 week(s).   Specialty: Internal Medicine Why: And with your primary gastroenterologist within a week Contact information: [place] [address] [phone]         Carlyle Basques, MD. Schedule an appointment as soon as possible for a visit in 1 week(s).   Specialty: Infectious Diseases Contact information: [place] [address] [phone]                  Major procedures and Radiology Reports - PLEASE review detailed and final reports thoroughly  -        CT ABDOMEN PELVIS WO CONTRAST  Result Date: 05/23/2021 CLINICAL DATA:  Abdominal pain of acute nature. Short gut syndrome. Chronic diarrhea. Crohn's disease. EXAM: CT ABDOMEN AND PELVIS WITHOUT CONTRAST TECHNIQUE: Multidetector CT imaging of the abdomen and pelvis was performed following the standard protocol without IV contrast. COMPARISON:  Ultrasound 13 days ago.  CT 04/07/2021 FINDINGS: Lower chest: Lung bases are clear. Hepatobiliary: Pneumobilia as seen chronically. Previous cholecystectomy. No focal parenchymal finding. Pancreas: No pancreatic abnormality seen. Spleen: Normal Adrenals/Urinary Tract: Adrenal glands are normal. Kidneys appear normal without contrast. Bladder is full but otherwise unremarkable. Stomach/Bowel: Multiple previous surgical procedures affecting the small intestine. Previous partial colectomy. Large amount of fecal matter and gas within the remaining colon. Previously seen findings of wall thickening in the sigmoid region are resolved. There could possibly be some ongoing proctitis. Vascular/Lymphatic: Venous catheter in place from the right groin has its tip in the IVC below the diaphragm. Aorta appears unremarkable. Reproductive: Previous hysterectomy.  No pelvic mass. Other: No free fluid or air. Musculoskeletal: Ordinary lower lumbar degenerative changes. IMPRESSION: Resolution of the previously seen colitis pattern. Large amount of fecal matter and gas within the  remaining colon, all the way to the rectum/anus. Cannot rule out a degree of proctitis. Electronically Signed   By: [name] Chimes M.D.   On: 05/23/2021 10:16   MR THORACIC SPINE W WO CONTRAST  Result Date: 05/09/2021 CLINICAL DATA:  Fever and possible discitis EXAM: MRI THORACIC WITHOUT AND WITH CONTRAST TECHNIQUE: Multiplanar and multiecho pulse sequences of the thoracic spine were obtained without and  with intravenous contrast. CONTRAST:  6.99m GADAVIST GADOBUTROL 1 MMOL/ML IV SOLN COMPARISON:  02/10/2021 FINDINGS: Alignment:  Physiologic. Vertebrae: Abnormal contrast enhancement and hyperintense T2-weighted signal at the T3-4 level has decreased since the prior study. Prevertebral and epidural abnormalities have also decreased. Cord:  Normal signal and morphology. Paraspinal and other soft tissues: Small, left-greater-than-right pleural effusions. Disc levels: No spinal canal or neural foraminal stenosis. IMPRESSION: 1. Decreased findings of discitis-osteomyelitis at the T3-4 level. 2. No spinal canal or neural foraminal stenosis. 3. Small, left-greater-than-right pleural effusions. Electronically Signed   By: KUlyses JarredM.D.   On: 05/09/2021 00:44   MR Lumbar Spine W Wo Contrast  Result Date: 05/09/2021 CLINICAL DATA:  Fever with history of discitis EXAM: MRI LUMBAR SPINE WITHOUT AND WITH CONTRAST TECHNIQUE: Multiplanar and multiecho pulse sequences of the lumbar spine were obtained without and with intravenous contrast. CONTRAST:  6.550mGADAVIST GADOBUTROL 1 MMOL/ML IV SOLN COMPARISON:  None. FINDINGS: Segmentation:  Standard. Alignment:  Physiologic. Vertebrae:  No fracture, evidence of discitis, or bone lesion. Conus medullaris and cauda equina: Conus extends to the L1 level. Conus and cauda equina appear normal. Paraspinal and other soft tissues: Negative. Disc levels: No spinal canal or neural foraminal stenosis. IMPRESSION: Normal lumbar spine MRI. No discitis/osteomyelitis or epidural abscess at the lumbar levels. Electronically Signed   By: KeUlyses Jarred.D.   On: 05/09/2021 00:54   MR SHOULDER LEFT W WO CONTRAST  Result Date: 05/23/2021 CLINICAL DATA:  Left shoulder pain, T3-4 thoracic discitis, fever and abdominal pain, history of Crohn's disease. EXAM: MRI OF THE LEFT SHOULDER WITHOUT AND WITH CONTRAST TECHNIQUE: Multiplanar, multisequence MR imaging of the left shoulder was performed  before and after the administration of intravenous contrast. CONTRAST:  6.61m1mADAVIST GADOBUTROL 1 MMOL/ML IV SOLN COMPARISON:  None. FINDINGS: Despite efforts by the technologist and patient, motion artifact is present on today's exam and could not be eliminated. This reduces exam sensitivity and specificity. Rotator cuff:  Mild distal supraspinatus tendinopathy anteriorly. Muscles:  Unremarkable Biceps long head:  Unremarkable Acromioclavicular Joint: No significant arthropathy. Type II acromion. No significant regional bursitis. Glenohumeral Joint: Mild degenerative chondral thinning. No joint effusion or substantial synovitis. Labrum:  Grossly unremarkable Bones: No significant extra-articular osseous abnormalities identified. Other: Small left axillary lymph nodes are present. IMPRESSION: 1. Mild supraspinatus tendinopathy. 2. No findings of regional infection. 3. Small left axillary lymph nodes. 4. Mild degenerative glenohumeral chondral thinning. Electronically Signed   By: WalVan ClinesD.   On: 05/23/2021 08:07   DG Chest Port 1 View  Result Date: 05/06/2021 CLINICAL DATA:  Questionable sepsis EXAM: PORTABLE CHEST 1 VIEW COMPARISON:  February 09, 2021 FINDINGS: The heart size borderline. The hila and mediastinum are unremarkable. No pneumothorax. No nodules or masses. No focal infiltrates. IMPRESSION: No acute abnormalities.  No cause for sepsis identified. Electronically Signed   By: DavDorise BullionI M.D   On: 05/06/2021 21:18   VAS US [place] EXTREMITY VENOUS (DVT)  Result Date: 05/08/2021  Lower Venous DVT Study Patient Name:  Caitlin Peters  Date of Exam:  05/07/2021 Medical Rec #: [id number]         Accession #:    [id number] Date of Birth: [birth year]         Patient Gender: F Patient Age:   60Y Exam Location:  [facility] Procedure:      VAS [place] LOWER EXTREMITY VENOUS (DVT) Referring Phys: [id number] ASIA B [place]  --------------------------------------------------------------------------------  Indications: PT reports intermittent swelling RT foot. Has PICC RT thigh.  Limitations: Bandaging around PICC. Comparison Study: 11-08-2020 Prior RT lower extremity venous study was negative                   for DVT. Performing Technologist: Darlin Coco RDMS,RVT  Examination Guidelines: A complete evaluation includes B-mode imaging, spectral Doppler, color Doppler, and power Doppler as needed of all accessible portions of each vessel. Bilateral testing is considered an integral part of a complete examination. Limited examinations for reoccurring indications may be performed as noted. The reflux portion of the exam is performed with the patient in reverse Trendelenburg.  +---------+---------------+---------+-----------+----------+--------------+ RIGHT    CompressibilityPhasicitySpontaneityPropertiesThrombus Aging +---------+---------------+---------+-----------+----------+--------------+ CFV      Full           Yes      Yes                                 +---------+---------------+---------+-----------+----------+--------------+ SFJ      Full                                                        +---------+---------------+---------+-----------+----------+--------------+ FV Prox  Full                                                        +---------+---------------+---------+-----------+----------+--------------+ FV Mid   Full                                                        +---------+---------------+---------+-----------+----------+--------------+ FV DistalFull                                                        +---------+---------------+---------+-----------+----------+--------------+ PFV      Full                                                        +---------+---------------+---------+-----------+----------+--------------+ POP      Full           Yes      Yes                                  +---------+---------------+---------+-----------+----------+--------------+  PTV      Full                                                        +---------+---------------+---------+-----------+----------+--------------+ PERO     Full                                                        +---------+---------------+---------+-----------+----------+--------------+ Gastroc  Full                                                        +---------+---------------+---------+-----------+----------+--------------+   +----+---------------+---------+-----------+----------+--------------+ LEFTCompressibilityPhasicitySpontaneityPropertiesThrombus Aging +----+---------------+---------+-----------+----------+--------------+ CFV Full           Yes      Yes                                 +----+---------------+---------+-----------+----------+--------------+    Summary: RIGHT: - There is no evidence of deep vein thrombosis in the lower extremity.  - No cystic structure found in the popliteal fossa. - Limited visualization of PICC at proximal, lateral thigh secondary to bandaging.  LEFT: - No evidence of common femoral vein obstruction.  *See table(s) above for measurements and observations. Electronically signed by Jamelle Haring on 05/08/2021 at 10:51:59 AM.    Final    US ABDOMEN LIMITED RUQ (LIVER/GB)  Result Date: 05/10/2021 CLINICAL DATA:  Elevated LFTs EXAM: ULTRASOUND ABDOMEN LIMITED RIGHT UPPER QUADRANT COMPARISON:  04/07/2021 FINDINGS: Gallbladder: Surgically removed Common bile duct: Diameter: 6.2 mm. Liver: No focal lesion identified. Within normal limits in parenchymal echogenicity. Portal vein is patent on color Doppler imaging with normal direction of blood flow towards the liver. Other: Multiple fluid-filled loops of bowel are noted similar to that seen on recent CT examination. IMPRESSION: Status post cholecystectomy. No acute abnormality noted.  Electronically Signed   By: Inez Catalina M.D.   On: 05/10/2021 13:14    Micro Results    Recent Results (from the past 240 hour(s))  SARS CORONAVIRUS 2 (TAT 6-24 HRS) Nasopharyngeal Nasopharyngeal Swab     Status: None   Collection Time: 05/22/21  9:45 PM   Specimen: Nasopharyngeal Swab  Result Value Ref Range Status   SARS Coronavirus 2 NEGATIVE NEGATIVE Final    Comment: (NOTE) SARS-CoV-2 target nucleic acids are NOT DETECTED.  The SARS-CoV-2 RNA is generally detectable in upper and lower respiratory specimens during the acute phase of infection. Negative results do not preclude SARS-CoV-2 infection, do not rule out co-infections with other pathogens, and should not be used as the sole basis for treatment or other patient management decisions. Negative results must be combined with clinical observations, patient history, and epidemiological information. The expected result is Negative.  Fact Sheet for Patients: SugarRoll.be  Fact Sheet for Healthcare Providers: https://www.woods-mathews.com/  This test is not yet approved or cleared by the [place] FDA and  has been authorized for detection and/or diagnosis of SARS-CoV-2 by FDA under an Emergency Use  Authorization (EUA). This EUA will remain  in effect (meaning this test can be used) for the duration of the COVID-19 declaration under Se ction 564(b)(1) of the Act, 21 U.S.C. section 360bbb-3(b)(1), unless the authorization is terminated or revoked sooner.  Performed at [facility], [place] [address], [place], [place] [address]   C Difficile Quick Screen w PCR reflex     Status: None   Collection Time: 05/23/21 12:34 PM   Specimen: STOOL  Result Value Ref Range Status   C Diff antigen NEGATIVE NEGATIVE Final   C Diff toxin NEGATIVE NEGATIVE Final   C Diff interpretation No C. difficile detected.  Final    Comment: Performed at [facility], [place] [address]  [place], [place] [address]    Today   Subjective    Caitlin Peters today has no headache,no chest abdominal pain,no new weakness tingling or numbness, feels much better wants to go home today.     Objective   Blood pressure (!) 148/72, pulse 86, temperature 98.1 F (36.7 C), temperature source Oral, resp. rate 14, weight 67.1 kg, SpO2 96 %.   Intake/Output Summary (Last 24 hours) at 05/25/2021 1238 Last data filed at 05/24/2021 1859 Gross per 24 hour  Intake 2205.97 ml  Output --  Net 2205.97 ml    Exam  Awake Alert, No new F.N deficits, Normal affect Goff.AT,PERRAL Supple Neck,No JVD, No cervical lymphadenopathy appriciated.  Symmetrical Chest wall movement, Good air movement bilaterally, CTAB RRR,No Gallops,Rubs or new Murmurs, No Parasternal Heave +ve B.Sounds, Abd Soft, Non tender, No organomegaly appriciated, No rebound -guarding or rigidity. No Cyanosis, left shoulder good range of motion while she was distracted   Data Review   CBC w Diff:  Lab Results  Component Value Date   WBC 3.4 (L) 05/24/2021   HGB 8.2 (L) 05/24/2021   HCT 26.0 (L) 05/24/2021   PLT 194 05/24/2021   LYMPHOPCT 37 05/24/2021   MONOPCT 9 05/24/2021   EOSPCT 3 05/24/2021   BASOPCT 0 05/24/2021    CMP:  Lab Results  Component Value Date   NA 139 05/25/2021   K 4.5 05/25/2021   CL 108 05/25/2021   CO2 27 05/25/2021   BUN 27 (H) 05/25/2021   CREATININE 1.27 (H) 05/25/2021   GLU 111 04/07/2021   PROT 5.3 (L) 05/25/2021   ALBUMIN 2.2 (L) 05/25/2021   BILITOT 0.3 05/25/2021   ALKPHOS 127 (H) 05/25/2021   AST 23 05/25/2021   ALT 55 (H) 05/25/2021  .   Total Time in preparing paper work, data evaluation and todays exam - 14 minutes  Lala Lund M.D on 05/25/2021 at 12:38 PM  [facility]

## 2021-05-25 NOTE — TOC Progression Note (Signed)
Transition of Care [facility]) - Progression Note    Patient Details  Name: Caitlin Peters MRN: [id number] Date of Birth: 1961-04-04  Transition of Care [facility]) CM/SW [place], RN Phone Number: 05/25/2021, 11:49 AM  Clinical Narrative:     Damaris Schooner to Hoyle Sauer at [place], MD is aware that a face to face needs to be placed for [facility] RN. Patient planning on DC today, with [facility]  Expected Discharge Plan: [place] Barriers to Discharge: Continued Medical Work up  Expected Discharge Plan and Services Expected Discharge Plan: [place] In-house Referral: NA Discharge Planning Services: CM Consult Post Acute Care Choice: [place] arrangements for the past 2 months: Single Family Home                   DME Agency: NA         HH Agency: [place] Date [place]: 05/24/21 Time [place]: [phone] Representative spoke with at [place]: [place] ([place]) Interventions    Readmission Risk Interventions Readmission Risk Prevention Plan 02/10/2021 12/21/2020 11/09/2020  Transportation Screening Complete Complete Complete  PCP or Specialist Appt within 3-5 Days - - -  Not Complete comments - - -  [place] or [place] Work Consult for [place] Planning/Counseling - - -  SW consult not completed comments - - -  Palliative Care Screening - - -  Medication Review Press photographer) Complete - Complete  PCP or Specialist appointment within 3-5 days of discharge - Complete Complete  HRI or Home Care Consult Complete Complete Complete  SW Recovery Care/Counseling Consult Complete Complete -  Palliative Care Screening Not Applicable - Not [place] Not Applicable - Not Applicable

## 2021-05-25 NOTE — Progress Notes (Signed)
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  Assessment: Recent discharge 6/21 from [facility]>readmitted 6/27 witih c/o L shoulder pain and swelling to BLE and incontinence, weakness, nausea.  Patient is on home TPN due to h/o pancreatitis, Crohn's s/p multiple SBRs, short gut syndrome with other GI comorbidities. TPN was held for a time during her recent previous admission for elevated LFT's that resolved and TPN was resumed 6/19.  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 5 units SSI given. Electrolytes: WNL. CoCa 9.66 Renal: SCr up 1.27.  Hepatic: ALT 55 remains slightly elevated. Tbili WNL. Triglycerides 104.  Alb/prealbumin: albumin 2.2, prealbumin 26.7 Intake / Output: UOP unmeasured, LBM 6/29. +witnessed vomiting, not recorded. I/P +4.2L GI Imaging: - 6/28 CT: Large amount of fecal matter and gas within the remaining colon, all the way to the rectum/anus. Cannot rule out a degree of proctitis  GI Surgeries / Procedures: none GI Meds: Chronic diarrhea on: Entocort, Creon 36,000 TID, Protonix 66m/d, Sucralfate TIDWC, Teduglutide daily (for SBS)--family to provide. Significant stool burden- plan Miralax  Central access: PICC line TPN start date: (at home). Inpatient start 6/28>>  Nutritional Goals (per RD recommendation on 6/28)  kCal: 1800-2000, Protein: 90-105, Fluid: >= 2 L/day  (PTA formula) 1088 kcal, Protein (15%) 85g, Carb 748kcal , lipids SMOF 60g/d: Na150, K 92, Cl 74, Acetate 216.5, Phos 24, Ca 10, Mg 10, N 13.4, Al  14: Infusion 18070mover 12 hrs. --last bag Sunday 6/26 PTA.  Goal TPN rate is 80 mL/hr will prvide 96g protein, 57.6g lipids, 288g carbs, 1939 total kcal  Current Nutrition:  TPN Soft diet  Plan:  Infuse 1920 mL over 12 hrs : 87 mL/hr x 1 hr, then 175 mL/hr x 10 hrs, then 87 mL/hr x 1 hr. This TPN provides 96 g of protein, 288 g of dextrose, and 58 g of lipids for a total of 1939 kcals meeting 100% of patient needs Electrolytes in TPN: standard concentration; max Ac Add MVI, trace elements to TPN Continue sensitive SSI and adjust as needed Monitor TPN labs Mon/Thurs and PRN  Thank you for involving pharmacy in this patient's care.  JeRenold GentaPharmD, BCPS Clinical Pharmacist Clinical phone for 05/25/2021 until 3p is [phone]/30/2022 6:55 AM  **Pharmacist phone directory can be found on [facility] listed under [place]

## 2021-05-25 NOTE — TOC Transition Note (Signed)
Transition of Care [facility]) - CM/SW Discharge Note   Patient Details  Name: Caitlin Peters MRN: [id number] Date of Birth: 08-May-1961  Transition of Care [facility]) CM/SW Contact:  Verdell Carmine, RN Phone Number: 05/25/2021, 1:03 PM   Clinical Narrative:     Patient being Dc today, Pam from [facility] infusion and Hoyle Sauer from [place] aware.  No further needs identified.    Final next level of care: [place] Barriers to Discharge: Continued Medical Work up   Patient Goals and CMS Choice     Choice offered to / list presented to : Patient  Discharge Placement                       Discharge Plan and Services In-house Referral: NA Discharge Planning Services: CM Consult Post Acute Care Choice: Home Health            DME Agency: NA       HH Arranged: RN, PT, OT HH Agency: [place] Date [facility] Agency Contacted: 05/25/21 Time [place]: 1303 Representative spoke with at [place]: [place] ([place]) Interventions     Readmission Risk Interventions Readmission Risk Prevention Plan 02/10/2021 12/21/2020 11/09/2020  Transportation Screening Complete Complete Complete  PCP or Specialist Appt within 3-5 Days - - -  Not Complete comments - - -  [place] or [place] Work Consult for [place] Planning/Counseling - - -  SW consult not completed comments - - -  Palliative Care Screening - - -  Medication Review Press photographer) Complete - Complete  PCP or Specialist appointment within 3-5 days of discharge - Complete Complete  HRI or Home Care Consult Complete Complete Complete  SW Recovery Care/Counseling Consult Complete Complete -  Palliative Care Screening Not Applicable - Not [place] Not Applicable - Not Applicable

## 2021-05-27 ENCOUNTER — Ambulatory Visit (HOSPITAL_COMMUNITY)
Admission: RE | Admit: 2021-05-27 | Discharge: 2021-05-27 | Disposition: A | Discharge status: Home / Self Care | Payer: Medicare Other | Source: Ambulatory Visit | Attending: Internal Medicine | Admitting: Internal Medicine

## 2021-05-30 ENCOUNTER — Telehealth: Payer: Self-pay | Admitting: Internal Medicine

## 2021-05-30 NOTE — Telephone Encounter (Signed)
Claiborne Billings, RN w/[facility] is all needing verbal orders for 2 week 1  1 week 8  to do central line care and lab work.  May leave a detail msg on her secured voice mail.

## 2021-05-31 ENCOUNTER — Other Ambulatory Visit: Payer: Self-pay | Admitting: Internal Medicine

## 2021-05-31 DIAGNOSIS — K912 Postsurgical malabsorption, not elsewhere classified: Secondary | ICD-10-CM

## 2021-05-31 NOTE — Telephone Encounter (Signed)
Left detailed message on machine for [facility] on secure voicemail with verbal orders.

## 2021-06-02 ENCOUNTER — Ambulatory Visit: Payer: Medicare Other | Admitting: Internal Medicine

## 2021-06-02 ENCOUNTER — Emergency Department (HOSPITAL_COMMUNITY): Payer: Medicare Other

## 2021-06-02 ENCOUNTER — Emergency Department (HOSPITAL_COMMUNITY)
Admission: EM | Admit: 2021-06-02 | Discharge: 2021-06-03 | Disposition: A | Discharge status: Home / Self Care | Payer: Medicare Other | Attending: Emergency Medicine | Admitting: Emergency Medicine

## 2021-06-02 ENCOUNTER — Encounter (HOSPITAL_COMMUNITY): Payer: Self-pay | Admitting: Emergency Medicine

## 2021-06-02 DIAGNOSIS — M542 Cervicalgia: Secondary | ICD-10-CM

## 2021-06-02 DIAGNOSIS — N183 Chronic kidney disease, stage 3 unspecified: Secondary | ICD-10-CM | POA: Insufficient documentation

## 2021-06-02 DIAGNOSIS — Z79899 Other long term (current) drug therapy: Secondary | ICD-10-CM | POA: Insufficient documentation

## 2021-06-02 DIAGNOSIS — Z87891 Personal history of nicotine dependence: Secondary | ICD-10-CM | POA: Insufficient documentation

## 2021-06-02 DIAGNOSIS — I129 Hypertensive chronic kidney disease with stage 1 through stage 4 chronic kidney disease, or unspecified chronic kidney disease: Secondary | ICD-10-CM | POA: Insufficient documentation

## 2021-06-02 DIAGNOSIS — Z8616 Personal history of COVID-19: Secondary | ICD-10-CM | POA: Diagnosis not present

## 2021-06-02 DIAGNOSIS — M25512 Pain in left shoulder: Secondary | ICD-10-CM | POA: Insufficient documentation

## 2021-06-02 LAB — COMPREHENSIVE METABOLIC PANEL
ALT: 56 U/L — ABNORMAL HIGH (ref 0–44)
AST: 55 U/L — ABNORMAL HIGH (ref 15–41)
Albumin: 3 g/dL — ABNORMAL LOW (ref 3.5–5.0)
Alkaline Phosphatase: 150 U/L — ABNORMAL HIGH (ref 38–126)
Anion gap: 7 (ref 5–15)
BUN: 37 mg/dL — ABNORMAL HIGH (ref 6–20)
CO2: 25 mmol/L (ref 22–32)
Calcium: 9 mg/dL (ref 8.9–10.3)
Chloride: 107 mmol/L (ref 98–111)
Creatinine, Ser: 1.35 mg/dL — ABNORMAL HIGH (ref 0.44–1.00)
GFR, Estimated: 45 mL/min — ABNORMAL LOW (ref >60)
Glucose, Bld: 91 mg/dL (ref 70–99)
Potassium: 4.1 mmol/L (ref 3.5–5.1)
Sodium: 139 mmol/L (ref 135–145)
Total Bilirubin: 0.6 mg/dL (ref 0.3–1.2)
Total Protein: 7.1 g/dL (ref 6.5–8.1)

## 2021-06-02 LAB — CBC WITH DIFFERENTIAL/PLATELET
Abs Immature Granulocytes: 0 10*3/uL (ref 0.00–0.07)
Basophils Absolute: 0 10*3/uL (ref 0.0–0.1)
Basophils Relative: 0 %
Eosinophils Absolute: 0.2 10*3/uL (ref 0.0–0.5)
Eosinophils Relative: 5 %
HCT: 30.2 % — ABNORMAL LOW (ref 36.0–46.0)
Hemoglobin: 9.8 g/dL — ABNORMAL LOW (ref 12.0–15.0)
Immature Granulocytes: 0 %
Lymphocytes Relative: 41 %
Lymphs Abs: 1.3 10*3/uL (ref 0.7–4.0)
MCH: 30.3 pg (ref 26.0–34.0)
MCHC: 32.5 g/dL (ref 30.0–36.0)
MCV: 93.5 fL (ref 80.0–100.0)
Monocytes Absolute: 0.3 10*3/uL (ref 0.1–1.0)
Monocytes Relative: 9 %
Neutro Abs: 1.5 10*3/uL — ABNORMAL LOW (ref 1.7–7.7)
Neutrophils Relative %: 45 %
Platelets: 219 10*3/uL (ref 150–400)
RBC: 3.23 MIL/uL — ABNORMAL LOW (ref 3.87–5.11)
RDW: 14 % (ref 11.5–15.5)
WBC: 3.3 10*3/uL — ABNORMAL LOW (ref 4.0–10.5)
nRBC: 0 % (ref 0.0–0.2)

## 2021-06-02 LAB — SEDIMENTATION RATE: Sed Rate: 65 mm/hr — ABNORMAL HIGH (ref 0–22)

## 2021-06-02 LAB — C-REACTIVE PROTEIN: CRP: 0.7 mg/dL (ref <1.0)

## 2021-06-02 MED ORDER — LORAZEPAM 2 MG/ML IJ SOLN
1.0000 mg | Freq: Once | INTRAMUSCULAR | Status: AC | PRN
  Administered 2021-06-02: 1 mg via INTRAVENOUS
  Filled 2021-06-02: qty 1

## 2021-06-02 MED ORDER — HYDROMORPHONE HCL 2 MG PO TABS
4.0000 mg | ORAL_TABLET | Freq: Once | ORAL | Status: AC
  Administered 2021-06-02: 4 mg via ORAL
  Filled 2021-06-02: qty 2

## 2021-06-02 MED ORDER — HYDROMORPHONE HCL 1 MG/ML IJ SOLN
1.0000 mg | Freq: Once | INTRAMUSCULAR | Status: AC
  Administered 2021-06-02: 1 mg via INTRAVENOUS
  Filled 2021-06-02: qty 1

## 2021-06-02 MED ORDER — HYDROMORPHONE HCL 1 MG/ML IJ SOLN
1.0000 mg | Freq: Once | INTRAMUSCULAR | Status: AC
Start: 2021-06-02 — End: 2021-06-02
  Administered 2021-06-02: 1 mg via INTRAMUSCULAR
  Filled 2021-06-02: qty 1

## 2021-06-02 MED ORDER — CYCLOBENZAPRINE HCL 5 MG PO TABS
7.5000 mg | ORAL_TABLET | Freq: Once | ORAL | Status: AC
  Administered 2021-06-02: 7.5 mg via ORAL
  Filled 2021-06-02: qty 1.5

## 2021-06-02 NOTE — ED Notes (Signed)
MRI states they will come get the pt for scan around 6pm.

## 2021-06-02 NOTE — ED Provider Notes (Signed)
[facility] DEPT Provider Note   CSN: [id number] Arrival date & time: 06/02/21  9678     History Chief Complaint  Patient presents with   Shoulder Pain    C[facility]lin Peters is a 60 y.o. female.  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   Reports since she was in the hospital her pain has continued to be present despite taking her home narcotic medications. She reports taking dilaudid without significant improvement. Reports no known fevers, but the states she may have had a low grade fever that improved.  Pain located cervical spine and left side of neck, left shoulder.   Past Medical History:  Diagnosis Date   Acute pancreatitis 04/13/2020   Anasarca 10/2019   AVN (avascular necrosis of bone) ([name])    Cataract    Choledocholithiasis (sludge) s/p ERCP 10/2019 10/21/2020   Chronic pain syndrome    CKD (chronic kidney disease), stage III ([place])    Crohn disease ([place])    Crohn's disease of small & large intestine with SGS 1984   Caitlin Peters is a 60 year old female with a history of Crohn's disease diagnosed in 28 (age 74), history of long-term steroid use with osteoporosis, S/P multiple bowel resections ([id number]) complicated by chronic back and abdominal  pain, steatorrhea and short bowel syndrome. The patient has been left with ~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 attached to proximal transverse colon through rectum. She has been   Depression    Diverticulosis    GERD (gastroesophageal reflux disease)    HTN (hypertension)    IDA (iron deficiency anemia)    Malnutrition ([place])    Mass in chest    Osteoporosis    Osteoporosis 12/24/2014   Pancreatitis    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; 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; jejunal to transverse colon anastomosis Treated at [facility] SB lengthening to 165cm Dr Alene Mires, [place] deficiency     Patient Active Problem List   Diagnosis Date Noted   Chronic idiopathic constipation    Left shoulder pain 05/23/2021   Elevated LFTs    Sepsis ([place]) 05/06/2021   Acidosis 04/08/2021   Colitis 04/07/2021   Intractable nausea and vomiting 03/11/2021   Thoracic discitis 02/10/2021   Pressure injury of skin 12/18/2020   Bacteremia 12/17/2020   Seizures ([place]) 12/17/2020   Drug-seeking behavior 12/16/2020   Seizure ([place]) 12/15/2020   CKD (chronic kidney disease) stage 3, GFR 30-59 ml/min ([place]) 12/06/2020   COVID-19 virus infection 12/06/2020   Protein calorie malnutrition ([place]) 12/06/2020   Palpitations 11/22/2020   PAC (premature atrial contraction) 11/22/2020   History of central line-associated bloodstream infection ([facility]) 10/21/2020   Nausea & vomiting 10/21/2020   Choledocholithiasis (sludge) s/p ERCP 10/2019 10/21/2020   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   Abdominal  pain 81/85/6314   Acute metabolic encephalopathy    Hypomagnesemia    Partial small bowel obstruction ([name])    Bacteremia associated with intravascular line ([place]) 08/04/2020   Enterobacter sepsis ([place]) 07/22/2020   Fever of unknown origin 06/27/2020   Anxiety 06/03/2020   Acute pancreatitis 04/13/2020    Infection due to Acinetobacter species 03/10/2020   Pancytopenia ([place]) 03/05/2020   Central line infection    Elevated liver enzymes 01/02/2020   Cholangitis    Anasarca 10/28/2019   Acute kidney injury superimposed on chronic kidney disease ([place]) 10/28/2019   Falls 10/28/2019   Malnutrition ([place])    Vitamin B12 deficiency    GERD (gastroesophageal reflux disease)    Chronic pain syndrome    Hypokalemia due to excessive gastrointestinal loss of potassium 10/13/2019   Fever    Hypokalemia 10/12/2019   Chronic diarrhea 10/12/2019   Dysuria 10/12/2019   Bilateral lower extremity edema 10/12/2019   AKI (acute kidney injury) ([place]) 10/12/2019   Fungemia 08/27/2019   IDA (iron deficiency anemia) 11/03/2018   Dilation of biliary tract 08/28/2018   Severe diarrhea 03/07/2018   LFTs abnormal 01/27/2018   GI tract obstruction ([place]) 01/27/2018   Gram-negative bacteremia 09/13/2017   HTN (hypertension), benign 12/02/2016   Intractable pain 12/02/2016   Anemia 12/02/2016   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   Avascular necrosis ([place]) 06/14/2015   Polyarthralgia 05/03/2015   On total parenteral nutrition (TPN) 12/30/2014   Osteoporosis 12/24/2014   Low back pain 12/16/2014   Vitamin D deficiency 12/16/2014   Short gut syndrome 07/15/2014   History of colonic diverticulitis 2014   Depression 07/24/2012   Gram-positive bacteremia 07/24/2012   Small bowel obstruction due to adhesions ([place]) 07/24/2012   Diarrhea 12/13/2011   Neuralgia and neuritis 06/01/2011   Myalgia and myositis 08/12/2003   Crohn's disease of small & large intestine with SGS 1984    Past Surgical History:  Procedure Laterality Date   ABDOMINAL ADHESION SURGERY  01/22/2018   APPENDECTOMY  1989   BILIARY DILATION  11/26/2019   Procedure: BILIARY DILATION;  Surgeon: Jackquline Denmark, MD;  Location: [facility];  Service: Endoscopy;;   BILIARY DILATION  03/08/2020   Procedure: BILIARY DILATION;  Surgeon: Irving Copas., MD;  Location: [facility];  Service: Gastroenterology;;   BIOPSY  03/08/2020   Procedure: BIOPSY;  Surgeon: Irving Copas., MD;  Location: [facility] ENDOSCOPY;  Service: Gastroenterology;;   CHEST WALL RESECTION     right thoracotomy,resection of chest mass with anterior rib and reconstruction using prosthetic mesh and video arthroscopy   CHOLECYSTECTOMY  01/22/2018   COLONOSCOPY  2019   ENTEROSTOMY CLOSURE  04/1999   ERCP N/A 11/26/2019   Procedure: ENDOSCOPIC RETROGRADE CHOLANGIOPANCREATOGRAPHY (ERCP);  Surgeon: Jackquline Denmark, MD;  Location: [facility];  Service: Endoscopy;  Laterality: N/A;   ERCP N/A 03/08/2020   Procedure: ENDOSCOPIC RETROGRADE CHOLANGIOPANCREATOGRAPHY (ERCP);  Surgeon: Irving Copas., MD;  Location: [facility];  Service: Gastroenterology;  Laterality: N/A;   ESOPHAGOGASTRODUODENOSCOPY N/A 03/08/2020   Procedure: ESOPHAGOGASTRODUODENOSCOPY (EGD);  Surgeon: Irving Copas., MD;  Location: [facility];  Service: Gastroenterology;  Laterality: N/A;   EUS N/A 03/08/2020   Procedure: UPPER ENDOSCOPIC ULTRASOUND (EUS) LINEAR;  Surgeon: Irving Copas., MD;  Location: [facility];  Service: Gastroenterology;  Laterality: N/A;   ILEOCECETOMY  03/1999   ileocolon resection with abdominal stoma   ILEOSTOMY CLOSURE  2001   IR FLUORO GUIDE CV LINE LEFT  01/07/2020   IR FLUORO GUIDE CV LINE  LEFT  03/09/2020   IR FLUORO GUIDE CV LINE LEFT  05/09/2020   IR FLUORO GUIDE CV LINE LEFT  07/20/2020   IR FLUORO GUIDE CV LINE RIGHT  08/05/2020   IR FLUORO GUIDE CV LINE RIGHT  04/03/2021   IR PTA VENOUS EXCEPT DIALYSIS CIRCUIT  01/07/2020   IR REMOVAL TUN CV CATH W/O FL  08/05/2020   IR US GUIDE VASC ACCESS LEFT     x 2 06/17/19 and 09/14/2019   IR US GUIDE VASC ACCESS RIGHT  08/05/2020   KNEE SURGERY     right knee    LAPAROSCOPIC SMALL BOWEL RESECTION  2009   2000-2009.  SB resections for Crohns Disease - now with Short gut   OMENTECTOMY  01/22/2018    PARTIAL HYSTERECTOMY  1984   with LSO   REMOVAL OF STONES  11/26/2019   Procedure: REMOVAL OF STONES;  Surgeon: Jackquline Denmark, MD;  Location: [facility];  Service: Endoscopy;;   REMOVAL OF STONES  03/08/2020   Procedure: REMOVAL OF STONES;  Surgeon: Irving Copas., MD;  Location: [facility] ENDOSCOPY;  Service: Gastroenterology;;   SALPINGOOPHORECTOMY Left 1984   SALPINGOOPHORECTOMY Right 1990   SERIAL TRANSVERSE ENTEROPLASTY (STEP) - SMALL BOWEL LENGTHENING  01/22/2018   Dr Alene Mires, [facility] - SB length from 120 to 165cm    SMALL INTESTINE SURGERY  2002   SMALL INTESTINE SURGERY  2003   SPHINCTEROTOMY  11/26/2019   Procedure: SPHINCTEROTOMY;  Surgeon: Jackquline Denmark, MD;  Location: [facility] ENDOS[facility];  Service: Endoscopy;;   TOTAL ABDOMINAL HYSTERECTOMY  1990   with RSO   UPPER GASTROINTESTINAL ENDOSCOPY       OB History   No obstetric history on file.     Family History  Problem Relation Age of Onset   Breast cancer Sister    Multiple sclerosis Sister    Diabetes Sister    Lupus Sister    Colon cancer Other    Crohn's disease Other    Seizures Mother    Glaucoma Mother    CAD Father    Heart disease Father    Hypertension Father     Social History   Tobacco Use   Smoking status: Former    Pack years: 0.00   Smokeless tobacco: Never  Vaping Use   Vaping Use: Never used  Substance Use Topics   Alcohol use: Not Currently   Drug use: Never    Home Medications Prior to Admission medications   Medication Sig Start Date End Date Taking? Authorizing Provider  acetaminophen (TYLENOL) 325 MG tablet Take 650 mg by mouth every 6 (six) hours as needed for mild pain.     [provider]  amLODipine (NORVASC) 10 MG tablet Take 10 mg by mouth daily.    [provider]  budesonide (ENTOCORT EC) 3 MG 24 hr capsule Take 3 capsules (9 mg total) by mouth daily. 03/06/21   [name], [name], MD  buPROPion [facility] SR) 100 MG 12 hr tablet Take  200 mg by mouth daily. 03/02/21   [provider]  Calcium 200 MG TABS Take 200 mg by mouth daily.    [provider]  carvedilol (COREG) 25 MG tablet Take 1 tablet (25 mg total) by mouth 2 (two) times daily with a meal. 05/16/21 06/15/21  Darliss Cheney, MD  cholecalciferol (VITAMIN D3) 25 MCG (1000 UT) tablet Take 1,000 Units by mouth daily.     [provider]  cycloSPORINE (RESTASIS) 0.05 % ophthalmic  emulsion Place 1 drop into both eyes 2 (two) times daily.    [provider]  denosumab (PROLIA) 60 MG/ML SOSY injection Inject 60 mg into the skin every 6 (six) months.    [provider]  dexlansoprazole (DEXILANT) 60 MG capsule Take 1 capsule (60 mg total) by mouth daily. 12/28/20   [name], [name], MD  Dextran 70-Hypromellose 0.1-0.3 % SOLN Place 1 drop into both eyes 4 (four) times daily.    [provider]  diphenoxylate-atropine (LOMOTIL) 2.5-0.025 MG tablet TAKE 1 TABLET BY MOUTH 4 TIMES DAILY AS NEEDED FOR DIARRHEA OR  LOOSE  STOOLS 12/28/20   [name], [name], MD  DULoxetine (CYMBALTA) 30 MG capsule TAKE 3 CAPSULES BY MOUTH ONCE DAILY 03/22/21   [name], [name], MD  estradiol (ESTRACE) 2 MG tablet Take 1 tablet (2 mg total) by mouth daily. 12/07/20   [name], [name], MD  famotidine (PEPCID) 20 MG tablet Take 20 mg by mouth daily as needed for heartburn or indigestion.    [provider]  GATTEX 5 MG KIT Inject 5 mLs into the skin daily. 01/03/21   [provider]  hydrALAZINE (APRESOLINE) 100 MG tablet Take 1 tablet (100 mg total) by mouth 3 (three) times daily. 05/16/21 06/15/21  Darliss Cheney, MD  HYDROmorphone (DILAUDID) 4 MG tablet Take 4 mg by mouth in the morning, at noon, in the evening, and at bedtime. 04/18/21   [provider]  levETIRAcetam (KEPPRA) 500 MG tablet Take 500 mg by mouth 2 (two) times daily.    [provider]  lipase/protease/amylase (CREON) 36000  UNITS CPEP capsule Take 1 capsule (36,000 Units total) by mouth 3 (three) times daily as needed (with meals for digestion). 06/28/20   [name], [name], MD  loperamide (IMODIUM) 2 MG capsule TAKE 1 CAPSULE BY MOUTH AS NEEDED FOR DIARRHEA OR LOOSE STOOLS 06/01/21   [name], [name], MD  Menthol, Topical Analgesic, (ICY HOT BACK EX) Apply 1 application topically daily as needed (back pain).    [provider]  methadone (DOLOPHINE) 5 MG tablet Take 1 tablet (5 mg total) by mouth 5 (five) times daily. 06/30/20   Florencia Reasons, MD  Multiple Vitamins-Minerals (MULTIVITAMIN ADULT PO) Take 1 tablet by mouth daily.    [provider]  NARCAN 4 MG/0.1ML LIQD nasal spray kit Place 1 spray into the nose once as needed (overdose). 10/14/20   [provider]  PRESCRIPTION MEDICATION Inject 1 each into the vein daily. Home TPN . [facility] [place] . 1 bag for 12 hours. [phone]    [provider]  Probiotic Product (PROBIOTIC-10 PO) Take 1 capsule by mouth daily.     [provider]  promethazine (PHENERGAN) 25 MG tablet TAKE 1 TABLET BY MOUTH EVERY 6 HOURS AS NEEDED FOR NAUSEA FOR VOMITING 06/01/21   [name], [name], MD  sodium chloride 0.9 % infusion Inject 1 mL into the vein daily as needed (flush). 10/16/20   [provider]  sucralfate (CARAFATE) 1 GM/10ML suspension Take 10 mLs (1 g total) by mouth 4 (four) times daily -  with meals and at bedtime. 06/28/20   [name], [name], MD  Trace Minerals Cu-Mn-Se-Zn (TRALEMENT IV) Inject 1 mL into the vein See admin instructions. Used in TPN bag 4 times weekly    [provider]  vitamin B-12 (CYANOCOBALAMIN) 1000 MCG tablet Take 1,000 mcg by mouth daily.  [provider]    Allergies    Meperidine, Hyoscyamine, Cefepime, Gabapentin, Lyrica [pregabalin], Topamax [topiramate], Zosyn [piperacillin sod-tazobactam so], Fentanyl, and Morphine and  related  Review of Systems   Review of Systems  Constitutional:  Negative for fever.  Respiratory:  Negative for shortness of breath.   Cardiovascular:  Negative for chest pain.  Gastrointestinal:  Abdominal pain: chronic abdominal pain.  Musculoskeletal:  Positive for arthralgias and neck pain.  Skin:  Negative for wound.  Neurological:  Negative for headaches.   Physical Exam Updated Vital Signs BP (!) 181/70   Pulse 86   Temp 98.8 F (37.1 C) (Oral)   Resp 16   SpO2 99%   Physical Exam Vitals and nursing note reviewed.  Constitutional:      General: She is not in acute distress.    Appearance: Normal appearance. She is not ill-appearing, toxic-appearing or diaphoretic.  HENT:     Head: Normocephalic.  Eyes:     Conjunctiva/sclera: Conjunctivae normal.  Cardiovascular:     Rate and Rhythm: Normal rate and regular rhythm.     Pulses: Normal pulses.  Pulmonary:     Effort: Pulmonary effort is normal. No respiratory distress.  Musculoskeletal:        General: Tenderness ([place], left shoulder) present. No deformity or signs of injury.     Cervical back: No rigidity.     Comments: Normal passive ROM left shoulder  Skin:    General: Skin is warm and dry.     Coloration: Skin is not jaundiced or pale.  Neurological:     General: No focal deficit present.     Mental Status: She is alert and oriented to person, place, and time.    ED Results / Procedures / Treatments   Labs (all labs ordered are listed, but only abnormal results are displayed) Labs Reviewed  CBC WITH DIFFERENTIAL/PLATELET - Abnormal; Notable for the following components:      Result Value   WBC 3.3 (*)    RBC 3.23 (*)    Hemoglobin 9.8 (*)    HCT 30.2 (*)    Neutro Abs 1.5 (*)    All other components within normal limits  COMPREHENSIVE METABOLIC PANEL - Abnormal; Notable for the following components:   BUN 37 (*)    Creatinine, Ser 1.35 (*)    Albumin 3.0 (*)    AST 55 (*)    ALT 56 (*)     Alkaline Phosphatase 150 (*)    GFR, Estimated 45 (*)    All other components within normal limits  SEDIMENTATION RATE - Abnormal; Notable for the following components:   Sed Rate 65 (*)    All other components within normal limits  C-REACTIVE PROTEIN    EKG None  Radiology No results found.  Procedures Procedures   Medications Ordered in ED Medications  HYDROmorphone (DILAUDID) injection 1 mg (1 mg Intramuscular Given 06/02/21 1448)  LORazepam (ATIVAN) injection 1 mg (1 mg Intravenous Given 06/02/21 1900)  HYDROmorphone (DILAUDID) injection 1 mg (1 mg Intravenous Given 06/02/21 1616)  HYDROmorphone (DILAUDID) tablet 4 mg (4 mg Oral Given 06/02/21 1844)  cyclobenzaprine (FLEXERIL) tablet 7.5 mg (7.5 mg Oral Given 06/02/21 2113)    ED Course  I have reviewed the triage vital signs and the nursing notes.  Pertinent labs & imaging results that were available during my care of the patient were reviewed by me and considered in my medical decision making (see chart for details).    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  Low suspicion at this time for septic arthritis of left shoulder given recent MRI without signs of infection, good ROM.   She  describes neck as well as shoulder pain at this time pain and has not yet had MR imaging of her CSpine.  Given recent spinal infection and persistent neck and shoulder pain, ordered labs including ESR/CRP, MR cervical spine WWO contrast. Signed out with studies pending.     Final Clinical Impression(s) / ED Diagnoses Final diagnoses:  Acute pain of left shoulder  Neck pain    Rx / DC Orders ED Discharge Orders     None        Gareth Morgan, MD 06/02/21 2135

## 2021-06-02 NOTE — ED Notes (Signed)
Unable to sign due to shoulder pain

## 2021-06-02 NOTE — ED Notes (Signed)
Patient transported to MRI 

## 2021-06-02 NOTE — ED Triage Notes (Signed)
Per PTAR-left shoulder pain, decrease range of motion-has been seen for same symptoms in past

## 2021-06-02 NOTE — ED Notes (Signed)
MRI states they are on the way to transport the pt.

## 2021-06-02 NOTE — ED Notes (Signed)
Per MRI, pt unable to tolerate lying flat for MRI due to shoulder pain. Unable to complete scan. MD made aware.

## 2021-06-02 NOTE — ED Notes (Signed)
MRI states they came to the pts room and the pt was not in the room. Also states a RN told the MRI tech the pt was discharged. The MRI tech did not speak to primary RN Regulatory affairs officer) and the pt has been in the room since she arrived to the ED. MRI admits mistake and states they will now come get the pt for her scan around 730-8 pm.

## 2021-06-03 DIAGNOSIS — M25512 Pain in left shoulder: Secondary | ICD-10-CM | POA: Diagnosis not present

## 2021-06-03 MED ORDER — HYDROMORPHONE HCL 1 MG/ML IJ SOLN
1.0000 mg | Freq: Once | INTRAMUSCULAR | Status: AC
Start: 2021-06-03 — End: 2021-06-03
  Administered 2021-06-03: 1 mg via INTRAMUSCULAR
  Filled 2021-06-03: qty 1

## 2021-06-03 NOTE — ED Notes (Addendum)
MRI states they will come get the pt within the next 30 minutes.

## 2021-06-03 NOTE — ED Notes (Signed)
MRI states they are on the way to get the pt for her scan.

## 2021-06-03 NOTE — ED Provider Notes (Signed)
Patient signed out to me by previous provider.  Please refer to their note for full HPI.  Briefly this is a 60 year old female with past medical history of discitis and osteomyelitis and thoracic spine recently treated with IV antibiotics presents to the emergency department with neck and left shoulder pain.  Patient has chronic tendinitis and arthritic changes in the left shoulder.  However the patient is now saying that the neck pain and shoulder pain is more severe.  At her most recent admission the left shoulder was evaluated, they did not feel that this was septic arthritis and they thought this was ongoing chronic pain.  Signout was pending MRI of the neck to rule out any further discitis/osteomyelitis pathology.  Patient is pending MRI, the cervical spine MRI is negative I believe that this is all chronic left shoulder pain and she may be followed up as an outpatient with orthopedics.  Patient signed out.   Lorelle Gibbs, DO 06/03/21 0013

## 2021-06-03 NOTE — ED Provider Notes (Signed)
  Physical Exam  BP (!) 183/82   Pulse 81   Temp 98.8 F (37.1 C) (Oral)   Resp 16   SpO2 97%   Physical Exam  ED Course/Procedures     Procedures  MDM  Care assumed from Dr. Dina Rich at shift change.  Patient awaiting results of MRI of the cervical spine.  This shows no evidence for discitis or osteomyelitis.  There is chronic degenerative changes, but nothing appears emergent on this somewhat limited study secondary to patient's inability to lie flat.  Patient's continued pain addressed with an additional injection of Dilaudid.  She will be discharged with follow-up with primary.       Veryl Speak, MD 06/03/21 0201

## 2021-06-03 NOTE — Discharge Instructions (Addendum)
Continue medications as previously prescribed.  Follow-up with your primary doctor next week if symptoms are not improving.

## 2021-06-03 NOTE — ED Notes (Signed)
MRI states the pt was only able to tolerate 2 scans and no contrast was administered.

## 2021-06-05 ENCOUNTER — Telehealth: Payer: Self-pay | Admitting: Internal Medicine

## 2021-06-05 NOTE — Telephone Encounter (Signed)
Radovan - OT from [facility] called to let Dr. Jerilee Hoh know that he completed [place] with this patient today and is request extension for more OT  Frequency  2 times a week for 1 week starting this week  1 time a week for 4 weeks starting 07/18  [place] [phone]

## 2021-06-06 NOTE — Telephone Encounter (Signed)
Verbal orders given to Radovan for OT

## 2021-06-07 ENCOUNTER — Telehealth: Payer: Self-pay | Admitting: Internal Medicine

## 2021-06-07 NOTE — Telephone Encounter (Signed)
   SEQUOYAH RAMONE DOB: [birth year] MRN: [id number]   RIDER WAIVER AND RELEASE OF LIABILITY  For purposes of improving physical access to our facilities, [facility] is pleased to partner with third parties to provide [facility] patients or other authorized individuals the option of convenient, on-demand ground transportation services (the Technical bre[employer]") through use of the technology service that enables users to request on-demand ground transportation from independent third-party providers.  By opting to use and accept these [employer], I, the undersigned, hereby agree on behalf of myself, and on behalf of any minor child using the Government social research officer for whom I am the parent or legal guardian, as follows:  Government social research officer provided to me are provided by independent third-party transportation providers who are not [employer] or employees and who are unaffiliated with [employer]. [facility] is neither a transportation carrier nor a common or public carrier. [facility] has no control over the quality or safety of the transportation that occurs as a result of the [employer]. [facility] cannot guarantee that any third-party transportation provider will complete any arranged transportation service. [facility] makes no representation, warranty, or guarantee regarding the reliability, timeliness, quality, safety, suitability, or availability of any of the Transport Services or that they will be error free. I fully understand that traveling by vehicle involves risks and dangers of serious bodily injury, including permanent disability, paralysis, and death. I agree, on behalf of myself and on behalf of any minor child using the Transport Services for whom I am the parent or legal guardian, that the entire risk arising out of my use of the [employer] remains solely with me, to the maximum extent permitted under applicable law. The [employer] are provided "as  is" and "as available." [facility] disclaims all representations and warranties, express, implied or statutory, not expressly set out in these terms, including the implied warranties of merchantability and fitness for a particular purpose. I hereby waive and release [facility], its agents, employees, officers, directors, representatives, insurers, attorneys, assigns, successors, subsidiaries, and affiliates from any and all past, present, or future claims, demands, liabilities, actions, causes of action, or suits of any kind directly or indirectly arising from acceptance and use of the [employer]. I further waive and release [facility] and its affiliates from all present and future liability and responsibility for any injury or death to persons or damages to property caused by or related to the use of the [employer]. I have read this Waiver and Release of Liability, and I understand the terms used in it and their legal significance. This Waiver is freely and voluntarily given with the understanding that my right (as well as the right of any minor child for whom I am the parent or legal guardian using the [employer]) to legal recourse against [facility] in connection with the [employer] is knowingly surrendered in return for use of these services.   I attest that I read the consent document to [name], gave Ms. Aughenbaugh the opportunity to ask questions and answered the questions asked (if any). I affirm that [name] then provided consent for she's participation in this program.     Legrand Pitts

## 2021-06-08 ENCOUNTER — Ambulatory Visit
Admission: RE | Admit: 2021-06-08 | Discharge: 2021-06-08 | Disposition: A | Discharge status: Home / Self Care | Payer: Medicare Other | Source: Ambulatory Visit | Attending: Internal Medicine | Admitting: Internal Medicine

## 2021-06-08 ENCOUNTER — Other Ambulatory Visit: Payer: Self-pay

## 2021-06-08 DIAGNOSIS — Z1231 Encounter for screening mammogram for malignant neoplasm of breast: Secondary | ICD-10-CM

## 2021-06-12 ENCOUNTER — Emergency Department (HOSPITAL_COMMUNITY): Payer: Medicare Other

## 2021-06-12 ENCOUNTER — Emergency Department (HOSPITAL_COMMUNITY)
Admission: EM | Admit: 2021-06-12 | Discharge: 2021-06-12 | Disposition: A | Discharge status: Home / Self Care | Payer: Medicare Other | Attending: Emergency Medicine | Admitting: Emergency Medicine

## 2021-06-12 ENCOUNTER — Encounter (HOSPITAL_COMMUNITY): Payer: Self-pay

## 2021-06-12 ENCOUNTER — Other Ambulatory Visit: Payer: Self-pay

## 2021-06-12 DIAGNOSIS — K529 Noninfective gastroenteritis and colitis, unspecified: Secondary | ICD-10-CM | POA: Diagnosis not present

## 2021-06-12 DIAGNOSIS — Z79899 Other long term (current) drug therapy: Secondary | ICD-10-CM | POA: Insufficient documentation

## 2021-06-12 DIAGNOSIS — M25512 Pain in left shoulder: Secondary | ICD-10-CM | POA: Insufficient documentation

## 2021-06-12 DIAGNOSIS — Z87891 Personal history of nicotine dependence: Secondary | ICD-10-CM | POA: Insufficient documentation

## 2021-06-12 DIAGNOSIS — K219 Gastro-esophageal reflux disease without esophagitis: Secondary | ICD-10-CM | POA: Diagnosis not present

## 2021-06-12 DIAGNOSIS — R1032 Left lower quadrant pain: Secondary | ICD-10-CM | POA: Diagnosis present

## 2021-06-12 DIAGNOSIS — Z8616 Personal history of COVID-19: Secondary | ICD-10-CM | POA: Diagnosis not present

## 2021-06-12 DIAGNOSIS — M549 Dorsalgia, unspecified: Secondary | ICD-10-CM | POA: Diagnosis not present

## 2021-06-12 DIAGNOSIS — N183 Chronic kidney disease, stage 3 unspecified: Secondary | ICD-10-CM | POA: Insufficient documentation

## 2021-06-12 DIAGNOSIS — I129 Hypertensive chronic kidney disease with stage 1 through stage 4 chronic kidney disease, or unspecified chronic kidney disease: Secondary | ICD-10-CM | POA: Insufficient documentation

## 2021-06-12 LAB — COMPREHENSIVE METABOLIC PANEL
ALT: 22 U/L (ref 0–44)
AST: 17 U/L (ref 15–41)
Albumin: 3.1 g/dL — ABNORMAL LOW (ref 3.5–5.0)
Alkaline Phosphatase: 114 U/L (ref 38–126)
Anion gap: 8 (ref 5–15)
BUN: 31 mg/dL — ABNORMAL HIGH (ref 6–20)
CO2: 24 mmol/L (ref 22–32)
Calcium: 9 mg/dL (ref 8.9–10.3)
Chloride: 106 mmol/L (ref 98–111)
Creatinine, Ser: 1.14 mg/dL — ABNORMAL HIGH (ref 0.44–1.00)
GFR, Estimated: 55 mL/min — ABNORMAL LOW (ref >60)
Glucose, Bld: 95 mg/dL (ref 70–99)
Potassium: 3.6 mmol/L (ref 3.5–5.1)
Sodium: 138 mmol/L (ref 135–145)
Total Bilirubin: 0.6 mg/dL (ref 0.3–1.2)
Total Protein: 6.9 g/dL (ref 6.5–8.1)

## 2021-06-12 LAB — URINALYSIS, ROUTINE W REFLEX MICROSCOPIC
Bacteria, UA: NONE SEEN
Bilirubin Urine: NEGATIVE
Glucose, UA: NEGATIVE mg/dL
Hgb urine dipstick: NEGATIVE
Ketones, ur: NEGATIVE mg/dL
Leukocytes,Ua: NEGATIVE
Nitrite: NEGATIVE
Protein, ur: 100 mg/dL — AB
Specific Gravity, Urine: 1.02 (ref 1.005–1.030)
pH: 8 (ref 5.0–8.0)

## 2021-06-12 LAB — CBC WITH DIFFERENTIAL/PLATELET
Abs Immature Granulocytes: 0.01 10*3/uL (ref 0.00–0.07)
Basophils Absolute: 0 10*3/uL (ref 0.0–0.1)
Basophils Relative: 0 %
Eosinophils Absolute: 0.1 10*3/uL (ref 0.0–0.5)
Eosinophils Relative: 3 %
HCT: 28.6 % — ABNORMAL LOW (ref 36.0–46.0)
Hemoglobin: 9.3 g/dL — ABNORMAL LOW (ref 12.0–15.0)
Immature Granulocytes: 0 %
Lymphocytes Relative: 38 %
Lymphs Abs: 1.8 10*3/uL (ref 0.7–4.0)
MCH: 30.1 pg (ref 26.0–34.0)
MCHC: 32.5 g/dL (ref 30.0–36.0)
MCV: 92.6 fL (ref 80.0–100.0)
Monocytes Absolute: 0.3 10*3/uL (ref 0.1–1.0)
Monocytes Relative: 6 %
Neutro Abs: 2.5 10*3/uL (ref 1.7–7.7)
Neutrophils Relative %: 53 %
Platelets: 222 10*3/uL (ref 150–400)
RBC: 3.09 MIL/uL — ABNORMAL LOW (ref 3.87–5.11)
RDW: 13.8 % (ref 11.5–15.5)
WBC: 4.7 10*3/uL (ref 4.0–10.5)
nRBC: 0 % (ref 0.0–0.2)

## 2021-06-12 LAB — LIPASE, BLOOD: Lipase: 28 U/L (ref 11–51)

## 2021-06-12 MED ORDER — SODIUM CHLORIDE (PF) 0.9 % IJ SOLN
INTRAMUSCULAR | Status: AC
  Filled 2021-06-12: qty 50

## 2021-06-12 MED ORDER — HYDROMORPHONE HCL 1 MG/ML IJ SOLN
1.0000 mg | Freq: Once | INTRAMUSCULAR | Status: AC
  Administered 2021-06-12: 1 mg via INTRAVENOUS
  Filled 2021-06-12: qty 1

## 2021-06-12 MED ORDER — SODIUM CHLORIDE 0.9 % IV BOLUS
1000.0000 mL | Freq: Once | INTRAVENOUS | Status: AC
  Administered 2021-06-12: 1000 mL via INTRAVENOUS

## 2021-06-12 MED ORDER — CYCLOBENZAPRINE HCL 5 MG PO TABS
7.5000 mg | ORAL_TABLET | Freq: Once | ORAL | Status: AC
  Administered 2021-06-12: 7.5 mg via ORAL
  Filled 2021-06-12: qty 1.5

## 2021-06-12 MED ORDER — IOHEXOL 350 MG/ML SOLN
80.0000 mL | Freq: Once | INTRAVENOUS | Status: AC | PRN
  Administered 2021-06-12: 80 mL via INTRAVENOUS

## 2021-06-12 NOTE — ED Provider Notes (Signed)
Emergency Medicine Provider Triage Evaluation Note  Caitlin Peters , a 60 y.o. female  was evaluated in triage.  Pt complains of pain.  Patient states she has chronic pain.  Has had chronic left shoulder pain over the last 2 weeks.  Had MRI and was told she had arthritis per patient.  States she was seen here twice in June for similar complaint.  Patient also states she has abdominal pain.  She states this is been ongoing however worse than normal.  She also admits to some rectal pain.  She denies any melena or bright blood per rectum.  States she has history of Crohn's.  She has a right leg PICC line as she was recently discharged with sepsis in June.  She denies chest pain, shortness of breath lower extremity edema.  Review of Systems  Positive: Left shoulder pain, abdominal pain, constipation Negative: Chest pain, shortness of breath, fever, chills, emesis  Physical Exam  Ht 5' 8"  (1.727 m)   Wt 61.2 kg   BMI 20.53 kg/m  Gen:   Awake, no distress   Resp:  Normal effort  MSK:   Moves extremities without difficulty  Other:  Diffuse tenderness with palpation to any part of body  Medical Decision Making  Medically screening exam initiated at 11:20 AM.  Appropriate orders placed.  KETSIA LINEBAUGH was informed that the remainder of the evaluation will be completed by another provider, this initial triage assessment does not replace that evaluation, and the importance of remaining in the ED until their evaluation is complete.  Abdominal pain, left shoulder pain   Ahava Kissoon A, PA-C 06/12/21 1122    Malvin Johns, MD 06/12/21 1309

## 2021-06-12 NOTE — Discharge Instructions (Addendum)
Taking your MiraLAX. Please contact your gastroenterologist regarding your colitis flare to see if he need to adjust your medications.

## 2021-06-12 NOTE — ED Notes (Signed)
Pt refuse blood work, pt stated she has a line in her leg.

## 2021-06-12 NOTE — ED Triage Notes (Addendum)
Per EMS- Patient c/o left shoulder pain. Patient reported that she has had a MRI and CT and was told it was arthritis.  Patient also c/o abdominal pain and low back and reported to EMS that it is chronic and reported a history of Crohn's.  Patient has a right leg PICC line and states that she was recently discharged from the hospital with a a diagnosis of Sepsis.    Patient added that she was also having rectal pain.

## 2021-06-12 NOTE — ED Provider Notes (Signed)
[facility] DEPT Provider Note   CSN: [id number] Arrival date & time: 06/12/21  1058     History Chief Complaint  Patient presents with   Abdominal Pain   Shoulder Pain   Back Pain   Rectal Pain    Caitlin Peters is a 60 y.o. female.  The history is provided by the patient and medical records.  Abdominal Pain Shoulder Pain Associated symptoms: back pain   Back Pain Associated symptoms: abdominal pain   Caitlin Peters is a 60 y.o. female who presents to the Emergency Department complaining of abdominal pain and shoulder pain.  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  She reports increased LLQ abdominal pain.  Has associated nausea.  Pain started two days ago.  She has associated decreased appetite.  Pain is similar to prior crohns flares.  She had 7-8 BMs yesterday.    No vomiting.  Early this morning she woke with sweat.    She also complains of left shoulder pain since 6/27.  Pain is located in left upper shoulder that radiates to the left elbow, shoulder blade and neck.  Pain is described as sharp and throbbing/piercing.  Pain is worse with movement.    Completed abx in April for discitis.         Past Medical History:  Diagnosis Date   Acute pancreatitis 04/13/2020   Anasarca 10/2019   AVN (avascular necrosis of bone) ([name])    Cataract    Choledocholithiasis (sludge) s/p ERCP 10/2019 10/21/2020   Chronic pain syndrome    CKD (chronic kidney disease), stage III ([place])    Crohn disease ([place])    Crohn's disease of small & large intestine with SGS 1984   Caitlin Peters is a 60 year old female with a history of Crohn's disease diagnosed in 83 (age 1), history of long-term steroid use with osteoporosis, S/P multiple bowel resections (8413-2440) complicated by chronic back and abdominal pain, steatorrhea and short bowel syndrome. The patient has been left with ~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 attached to proximal  transverse colon through rectum. 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; 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; jejunal to transverse colon anastomosis Treated at [facility] SB lengthening to 165cm Dr Alene Mires, [place] deficiency     Patient Active Problem List   Diagnosis Date Noted   Chronic idiopathic constipation    Left shoulder pain 05/23/2021   Elevated LFTs    Sepsis ([place]) 05/06/2021   Acidosis 04/08/2021   Colitis 04/07/2021   Intractable nausea and vomiting 03/11/2021   Thoracic discitis 02/10/2021   Pressure injury of skin 12/18/2020   Bacteremia 12/17/2020   Seizures ([place]) 12/17/2020   Drug-seeking behavior 12/16/2020   Seizure ([place]) 12/15/2020   CKD (chronic kidney disease) stage 3, GFR 30-59 ml/min ([place]) 12/06/2020   COVID-19 virus infection 12/06/2020   Protein calorie malnutrition ([place]) 12/06/2020   Palpitations 11/22/2020   PAC (premature atrial contraction) 11/22/2020   History of central line-associated bloodstream infection (CLABSI)  10/21/2020   Nausea & vomiting 10/21/2020   Choledocholithiasis (sludge) s/p ERCP 10/2019 10/21/2020   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   Abdominal pain 37/16/9678   Acute metabolic encephalopathy    Hypomagnesemia    Partial small bowel obstruction ([name])    Bacteremia associated with intravascular line ([place]) 08/04/2020   Enterobacter sepsis ([place]) 07/22/2020   Fever of unknown origin 06/27/2020   Anxiety 06/03/2020   Acute pancreatitis 04/13/2020   Infection due to Acinetobacter species 03/10/2020   Pancytopenia ([place]) 03/05/2020   Central line infection     Elevated liver enzymes 01/02/2020   Cholangitis    Anasarca 10/28/2019   Acute kidney injury superimposed on chronic kidney disease ([place]) 10/28/2019   Falls 10/28/2019   Malnutrition ([place])    Vitamin B12 deficiency    GERD (gastroesophageal reflux disease)    Chronic pain syndrome    Hypokalemia due to excessive gastrointestinal loss of potassium 10/13/2019   Fever    Hypokalemia 10/12/2019   Chronic diarrhea 10/12/2019   Dysuria 10/12/2019   Bilateral lower extremity edema 10/12/2019   AKI (acute kidney injury) ([place]) 10/12/2019   Fungemia 08/27/2019   IDA (iron deficiency anemia) 11/03/2018   Dilation of biliary tract 08/28/2018   Severe diarrhea 03/07/2018   LFTs abnormal 01/27/2018   GI tract obstruction ([place]) 01/27/2018   Gram-negative bacteremia 09/13/2017   HTN (hypertension), benign 12/02/2016   Intractable pain 12/02/2016   Anemia 12/02/2016   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   Avascular necrosis ([place]) 06/14/2015   Polyarthralgia 05/03/2015   On total parenteral nutrition (TPN) 12/30/2014   Osteoporosis 12/24/2014   Low back pain 12/16/2014   Vitamin D deficiency 12/16/2014   Short gut syndrome 07/15/2014   History of colonic diverticulitis 2014   Depression 07/24/2012   Gram-positive bacteremia 07/24/2012   Small bowel obstruction due to adhesions ([place]) 07/24/2012   Diarrhea 12/13/2011   Neuralgia and neuritis 06/01/2011   Myalgia and myositis 08/12/2003   Crohn's disease of small & large intestine with SGS 1984    Past Surgical History:  Procedure Laterality Date   ABDOMINAL ADHESION SURGERY  01/22/2018   APPENDECTOMY  1989   BILIARY DILATION  11/26/2019   Procedure: BILIARY DILATION;  Surgeon: Jackquline Denmark, MD;  Location: [facility] ENDOSCOPY;  Service: Endoscopy;;   BILIARY DILATION  03/08/2020   Procedure: BILIARY DILATION;  Surgeon: Irving Copas., MD;  Location: [facility];  Service: Gastroenterology;;   BIOPSY  03/08/2020   Procedure: BIOPSY;   Surgeon: Irving Copas., MD;  Location: [facility] ENDOSCOPY;  Service: Gastroenterology;;   CHEST WALL RESECTION     right thoracotomy,resection of chest mass with anterior rib and reconstruction using prosthetic mesh and video arthroscopy   CHOLECYSTECTOMY  01/22/2018   COLONOSCOPY  2019   ENTEROSTOMY CLOSURE  04/1999   ERCP N/A 11/26/2019   Procedure: ENDOSCOPIC RETROGRADE CHOLANGIOPANCREATOGRAPHY (ERCP);  Surgeon: Jackquline Denmark, MD;  Location: [facility];  Service: Endoscopy;  Laterality: N/A;   ERCP N/A 03/08/2020   Procedure: ENDOSCOPIC RETROGRADE CHOLANGIOPANCREATOGRAPHY (ERCP);  Surgeon: Irving Copas., MD;  Location: [facility];  Service: Gastroenterology;  Laterality: N/A;   ESOPHAGOGASTRODUODENOSCOPY N/A 03/08/2020   Procedure: ESOPHAGOGASTRODUODENOSCOPY (EGD);  Surgeon: Irving Copas., MD;  Location: [facility];  Service: Gastroenterology;  Laterality: N/A;   EUS N/A 03/08/2020   Procedure: UPPER ENDOSCOPIC ULTRASOUND (EUS) LINEAR;  Surgeon: Irving Copas., MD;  Location: [facility];  Service: Gastroenterology;  Laterality: N/A;   ILEOCECETOMY  03/1999   ileocolon resection with abdominal  stoma   ILEOSTOMY CLOSURE  2001   IR FLUORO GUIDE CV LINE LEFT  01/07/2020   IR FLUORO GUIDE CV LINE LEFT  03/09/2020   IR FLUORO GUIDE CV LINE LEFT  05/09/2020   IR FLUORO GUIDE CV LINE LEFT  07/20/2020   IR FLUORO GUIDE CV LINE RIGHT  08/05/2020   IR FLUORO GUIDE CV LINE RIGHT  04/03/2021   IR PTA VENOUS EXCEPT DIALYSIS CIRCUIT  01/07/2020   IR REMOVAL TUN CV CATH W/O FL  08/05/2020   IR US GUIDE VASC ACCESS LEFT     x 2 06/17/19 and 09/14/2019   IR US GUIDE VASC ACCESS RIGHT  08/05/2020   KNEE SURGERY     right knee    LAPAROSCOPIC SMALL BOWEL RESECTION  2009   2000-2009.  SB resections for Crohns Disease - now with Short gut   OMENTECTOMY  01/22/2018   PARTIAL HYSTERECTOMY  1984   with LSO   REMOVAL OF STONES  11/26/2019   Procedure: REMOVAL OF STONES;   Surgeon: Jackquline Denmark, MD;  Location: [facility] ENDOS[facility];  Service: Endoscopy;;   REMOVAL OF STONES  03/08/2020   Procedure: REMOVAL OF STONES;  Surgeon: Irving Copas., MD;  Location: [facility] ENDOSCOPY;  Service: Gastroenterology;;   SALPINGOOPHORECTOMY Left 1984   SALPINGOOPHORECTOMY Right 1990   SERIAL TRANSVERSE ENTEROPLASTY (STEP) - SMALL BOWEL LENGTHENING  01/22/2018   Dr Alene Mires, [facility] - SB length from 120 to 165cm    SMALL INTESTINE SURGERY  2002   SMALL INTESTINE SURGERY  2003   SPHINCTEROTOMY  11/26/2019   Procedure: SPHINCTEROTOMY;  Surgeon: Jackquline Denmark, MD;  Location: [facility] ENDOSCOPY;  Service: Endoscopy;;   TOTAL ABDOMINAL HYSTERECTOMY  1990   with RSO   UPPER GASTROINTESTINAL ENDOSCOPY       OB History   No obstetric history on file.     Family History  Problem Relation Age of Onset   Seizures Mother    Glaucoma Mother    CAD Father    Heart disease Father    Hypertension Father    Breast cancer Sister    Multiple sclerosis Sister    Diabetes Sister    Lupus Sister    Colon cancer Other    Crohn's disease Other     Social History   Tobacco Use   Smoking status: Former   Smokeless tobacco: Never  Scientific laboratory technician Use: Never used  Substance Use Topics   Alcohol use: Not Currently   Drug use: Never    Home Medications Prior to Admission medications   Medication Sig Start Date End Date Taking? Authorizing Provider  acetaminophen (TYLENOL) 325 MG tablet Take 650 mg by mouth every 6 (six) hours as needed for mild pain.     [provider]  amLODipine (NORVASC) 10 MG tablet Take 10 mg by mouth daily.    [provider]  budesonide (ENTOCORT EC) 3 MG 24 hr capsule Take 3 capsules (9 mg total) by mouth daily. 03/06/21   [name], [name], MD  buPROPion [facility] SR) 100 MG 12 hr tablet Take 200 mg by mouth daily. 03/02/21   [provider]  Calcium 200 MG TABS Take 200 mg by mouth daily.    [provider]  carvedilol (COREG) 25 MG tablet Take 1 tablet (25 mg total) by mouth 2 (two) times daily with a meal. 05/16/21 06/15/21  Darliss Cheney, MD  cholecalciferol (VITAMIN D3) 25 MCG (1000 UT) tablet Take  1,000 Units by mouth daily.     [provider]  cycloSPORINE (RESTASIS) 0.05 % ophthalmic emulsion Place 1 drop into both eyes 2 (two) times daily.    [provider]  denosumab (PROLIA) 60 MG/ML SOSY injection Inject 60 mg into the skin every 6 (six) months.    [provider]  dexlansoprazole (DEXILANT) 60 MG capsule Take 1 capsule (60 mg total) by mouth daily. 12/28/20   [name], [name], MD  Dextran 70-Hypromellose 0.1-0.3 % SOLN Place 1 drop into both eyes 4 (four) times daily.    [provider]  diphenoxylate-atropine (LOMOTIL) 2.5-0.025 MG tablet TAKE 1 TABLET BY MOUTH 4 TIMES DAILY AS NEEDED FOR DIARRHEA OR  LOOSE  STOOLS 12/28/20   [name], [name], MD  DULoxetine (CYMBALTA) 30 MG capsule TAKE 3 CAPSULES BY MOUTH ONCE DAILY 03/22/21   [name], [name], MD  estradiol (ESTRACE) 2 MG tablet Take 1 tablet (2 mg total) by mouth daily. 12/07/20   [name], [name], MD  famotidine (PEPCID) 20 MG tablet Take 20 mg by mouth daily as needed for heartburn or indigestion.    [provider]  GATTEX 5 MG KIT Inject 5 mLs into the skin daily. 01/03/21   [provider]  hydrALAZINE (APRESOLINE) 100 MG tablet Take 1 tablet (100 mg total) by mouth 3 (three) times daily. 05/16/21 06/15/21  Darliss Cheney, MD  HYDROmorphone (DILAUDID) 4 MG tablet Take 4 mg by mouth in the morning, at noon, in the evening, and at bedtime. 04/18/21   [provider]  levETIRAcetam (KEPPRA) 500 MG tablet Take 500 mg by mouth 2 (two) times daily.    [provider]  lipase/protease/amylase (CREON) 36000 UNITS CPEP capsule Take 1 capsule (36,000 Units total) by mouth 3 (three) times daily as needed (with meals for  digestion). 06/28/20   [name], [name], MD  loperamide (IMODIUM) 2 MG capsule TAKE 1 CAPSULE BY MOUTH AS NEEDED FOR DIARRHEA OR LOOSE STOOLS 06/01/21   [name], [name], MD  Menthol, Topical Analgesic, (ICY HOT BACK EX) Apply 1 application topically daily as needed (back pain).    [provider]  methadone (DOLOPHINE) 5 MG tablet Take 1 tablet (5 mg total) by mouth 5 (five) times daily. 06/30/20   Florencia Reasons, MD  Multiple Vitamins-Minerals (MULTIVITAMIN ADULT PO) Take 1 tablet by mouth daily.    [provider]  NARCAN 4 MG/0.1ML LIQD nasal spray kit Place 1 spray into the nose once as needed (overdose). 10/14/20   [provider]  PRESCRIPTION MEDICATION Inject 1 each into the vein daily. Home TPN . [facility] . 1 bag for 12 hours. [phone]    [provider]  Probiotic Product (PROBIOTIC-10 PO) Take 1 capsule by mouth daily.     [provider]  promethazine (PHENERGAN) 25 MG tablet TAKE 1 TABLET BY MOUTH EVERY 6 HOURS AS NEEDED FOR NAUSEA FOR VOMITING 06/01/21   [name], [name], MD  sodium chloride 0.9 % infusion Inject 1 mL into the vein daily as needed (flush). 10/16/20   [provider]  sucralfate (CARAFATE) 1 GM/10ML suspension Take 10 mLs (1 g total) by mouth 4 (four) times daily -  with meals and at bedtime. 06/28/20   [name], [name], MD  Trace Minerals Cu-Mn-Se-Zn (TRALEMENT IV) Inject 1 mL into the vein See admin instructions. Used in TPN bag 4 times weekly  [provider]  vitamin B-12 (CYANOCOBALAMIN) 1000 MCG tablet Take 1,000 mcg by mouth daily.     [provider]    Allergies    Meperidine, Hyoscyamine, Cefepime, Gabapentin, Lyrica [pregabalin], Topamax [topiramate], Zosyn [piperacillin sod-tazobactam so], Fentanyl, and Morphine and related  Review of Systems   Review of Systems  Gastrointestinal:  Positive for abdominal pain.   Musculoskeletal:  Positive for back pain.  All other systems reviewed and are negative.  Physical Exam Updated Vital Signs BP (!) 144/96 (BP Location: Right Arm)   Pulse (!) 50   Temp 98.5 F (36.9 C) (Oral)   Resp 18   Ht 5' 8" (1.727 m)   Wt 61.2 kg   SpO2 99%   BMI 20.53 kg/m   Physical Exam Vitals and nursing note reviewed.  Constitutional:      Appearance: She is well-developed.  HENT:     Head: Normocephalic and atraumatic.  Cardiovascular:     Rate and Rhythm: Normal rate and regular rhythm.     Heart sounds: No murmur heard. Pulmonary:     Effort: Pulmonary effort is normal. No respiratory distress.     Breath sounds: Normal breath sounds.  Abdominal:     Palpations: Abdomen is soft.     Tenderness: There is no guarding or rebound.     Comments: Moderate generalized abdominal tenderness  Musculoskeletal:        General: No tenderness.     Comments: Picc line in right groin.  TTP over left shoulder without erythema or edema.  Able to range shoulder but there is pain with ROM.    Skin:    General: Skin is warm and dry.  Neurological:     Mental Status: She is alert and oriented to person, place, and time.     Comments: 5/5 strength in all four extremities.    Psychiatric:        Behavior: Behavior normal.    ED Results / Procedures / Treatments   Labs (all labs ordered are listed, but only abnormal results are displayed) Labs Reviewed  CBC WITH DIFFERENTIAL/PLATELET - Abnormal; Notable for the following components:      Result Value   RBC 3.09 (*)    Hemoglobin 9.3 (*)    HCT 28.6 (*)    All other components within normal limits  COMPREHENSIVE METABOLIC PANEL - Abnormal; Notable for the following components:   BUN 31 (*)    Creatinine, Ser 1.14 (*)    Albumin 3.1 (*)    GFR, Estimated 55 (*)    All other components within normal limits  URINALYSIS, ROUTINE W REFLEX MICROSCOPIC - Abnormal; Notable for the following components:   Protein, ur 100 (*)     All other components within normal limits  URINE CULTURE  LIPASE, BLOOD    EKG None  Radiology CT Abdomen Pelvis W Contrast  Result Date: 06/12/2021 CLINICAL DATA:  Abdominal pain and fever. History of Crohn's disease EXAM: CT ABDOMEN AND PELVIS WITH CONTRAST TECHNIQUE: Multidetector CT imaging of the abdomen and pelvis was performed using the standard protocol following bolus administration of intravenous contrast. CONTRAST:  9m OMNIPAQUE IOHEXOL 350 MG/ML SOLN COMPARISON:  05/23/2021, 12/15/2020 FINDINGS: Lower chest: Included lung bases are clear. Heart size within normal limits. Hepatobiliary: No focal liver lesion is identified. Prior cholecystectomy with mild intra and extrahepatic biliary dilatation. Minimal pneumobilia, less pronounced compared to the previous CT. Pancreas: Unchanged mild pancreatic ductal dilatation. Pancreas appears otherwise unremarkable. No peripancreatic inflammatory  changes. Spleen: Normal in size without focal abnormality. Adrenals/Urinary Tract: Unremarkable adrenal glands. Kidneys enhance symmetrically without focal lesion, stone, or hydronephrosis. Ureters are nondilated. Urinary bladder appears unremarkable. Stomach/Bowel: Extensive postsurgical changes are again seen involving the large and small bowel within the abdomen and pelvis. There are a few mildly prominent loops of small bowel with air-fluid level in the central abdomen, less prominent when compared with the most recent previous study. No evidence to suggest high-grade bowel obstruction. Long segment circumferential wall thickening of descending colon suggesting colitis. Vascular/Lymphatic: Scattered aortoiliac atherosclerotic calcifications without aneurysm. Circumaortic left renal vein. Right femoral approach central venous catheter remains in place terminating within the intrahepatic IVC. No abdominopelvic lymphadenopathy. Reproductive: Status post hysterectomy. No adnexal masses. Other: No free fluid.  No abdominopelvic fluid collection. No pneumoperitoneum. No abdominal wall hernia. Musculoskeletal: No new or acute osseous findings. IMPRESSION: 1. Long segment circumferential wall thickening of the descending colon suggesting colitis, likely related to patient's known inflammatory bowel disease. 2. There are a few mildly prominent loops of small bowel with air-fluid level in the central abdomen, less prominent when compared with the most recent previous study. Findings are likely reactive. No evidence to suggest high-grade bowel obstruction. Aortic Atherosclerosis (ICD10-I70.0). Electronically Signed   By: Davina Poke D.O.   On: 06/12/2021 17:50   DG Shoulder Left  Result Date: 06/12/2021 CLINICAL DATA:  Left shoulder pain EXAM: LEFT SHOULDER - 2+ VIEW COMPARISON:  05/23/2021 FINDINGS: There is no evidence of fracture or dislocation. Mild glenohumeral joint space narrowing. AC joint appears within normal limits. Soft tissues are unremarkable. IMPRESSION: Mild glenohumeral joint space narrowing.  No acute findings. Electronically Signed   By: Davina Poke D.O.   On: 06/12/2021 18:00    Procedures Procedures   Medications Ordered in ED Medications  sodium chloride 0.9 % bolus 1,000 mL (0 mLs Intravenous Stopped 06/12/21 1958)  HYDROmorphone (DILAUDID) injection 1 mg (1 mg Intravenous Given 06/12/21 1706)  iohexol (OMNIPAQUE) 350 MG/ML injection 80 mL (80 mLs Intravenous Contrast Given 06/12/21 1714)  sodium chloride (PF) 0.9 % injection (  Given 06/12/21 1713)  HYDROmorphone (DILAUDID) injection 1 mg (1 mg Intravenous Given 06/12/21 1846)  HYDROmorphone (DILAUDID) injection 1 mg (1 mg Intravenous Given 06/12/21 2021)  cyclobenzaprine (FLEXERIL) tablet 7.5 mg (7.5 mg Oral Given 06/12/21 2106)    ED Course  I have reviewed the triage vital signs and the nursing notes.  Pertinent labs & imaging results that were available during my care of the patient were reviewed by me and considered in my  medical decision making (see chart for details).    MDM Rules/Calculators/A&P                         patient with history of Crohn's, shortgut syndrome on chronic TPN here for evaluation of acute on chronic abdominal pain, diarrhea and shoulder pain. She is non-toxic appearing on evaluation. She has tenderness on exam without peritoneal findings. CT scan is consistent with colitis. She recently discontinued her anti-diarrheal medications. She continues to take MiraLAX. Discussed recommendation for discontinuing MiraLAX as this may be contributing to the colitis findings. Recommend that she discussed with her G.I. Dr. further adjustments to her current medications. In terms of her shoulder pain, this appears to be musculoskeletal in nature. Reviewed recent records. She did recently have MRI cervical spine as well as shoulder. Presentation is not consistent with recurrent septic arthritis/disc -itis. Plan to discharge home with close outpatient  follow-up.   Final Clinical Impression(s) / ED Diagnoses Final diagnoses:  Colitis    Rx / DC Orders ED Discharge Orders     None        Quintella Reichert, MD 06/12/21 2333

## 2021-06-13 ENCOUNTER — Ambulatory Visit (INDEPENDENT_AMBULATORY_CARE_PROVIDER_SITE_OTHER): Payer: Medicare Other | Admitting: Internal Medicine

## 2021-06-13 ENCOUNTER — Encounter: Payer: Self-pay | Admitting: Internal Medicine

## 2021-06-13 VITALS — BP 138/78 | HR 95 | Temp 97.3°F

## 2021-06-13 DIAGNOSIS — Z09 Encounter for follow-up examination after completed treatment for conditions other than malignant neoplasm: Secondary | ICD-10-CM | POA: Diagnosis not present

## 2021-06-13 DIAGNOSIS — M25512 Pain in left shoulder: Secondary | ICD-10-CM | POA: Diagnosis not present

## 2021-06-13 MED ORDER — METHYLPREDNISOLONE ACETATE 40 MG/ML IJ SUSP
40.0000 mg | Freq: Once | INTRAMUSCULAR | Status: AC
  Administered 2021-06-13: 40 mg via INTRAMUSCULAR

## 2021-06-13 MED ORDER — METHOCARBAMOL 500 MG PO TABS: 500.0000 mg | ORAL_TABLET | Freq: Two times a day (BID) | ORAL | 1 refills | Status: DC

## 2021-06-13 NOTE — Progress Notes (Signed)
Established Patient Office Visit     This visit occurred during the SARS-CoV-2 public health emergency.  Safety protocols were in place, including screening questions prior to the visit, additional usage of staff PPE, and extensive cleaning of exam room while observing appropriate contact time as indicated for disinfecting solutions.    CC/Reason for Visit: Hospital follow-up, left shoulder pain  HPI: Caitlin Peters is a 60 y.o. female who is coming in today for the above mentioned reasons.  She has had multiple hospitalizations due to Crohn's disease with short gut syndrome, on TNA and multiple episodes of repeated bacteremia and septicemia with thoracic spine epidural abscess.  She was hospitalized from 6/27-6/30/2022 due to significant left shoulder pain.  She was found to have acute renal failure which resolved with IV fluids.  During that hospitalization she had an MRI of the shoulder that was negative except for mild supraspinatus tendinopathy.  Physical therapy was arranged which she has started.  She is also using Biofreeze.  She then had to visit the emergency department on 7/8, 7/9 and 7/18 all due to significant shoulder pain.  This pain is unresponsive to Dilaudid that she receives for chronic pain management.  She seems in significant distress due to the shoulder pain.  During one of her ED visits she also had a cervical spine MRI that was also negative.  She is requesting muscle relaxers.  Past Medical/Surgical History: Past Medical History:  Diagnosis Date   Acute pancreatitis 04/13/2020   Anasarca 10/2019   AVN (avascular necrosis of bone) ([name])    Cataract    Choledocholithiasis (sludge) s/p ERCP 10/2019 10/21/2020   Chronic pain syndrome    CKD (chronic kidney disease), stage III ([place])    Crohn disease ([place])    Crohn's disease of small & large intestine with SGS 1984   Caitlin Peters is a 60 year old female with a history of Crohn's disease diagnosed in 33 (age  48), history of long-term steroid use with osteoporosis, S/P multiple bowel resections ([id number]) complicated by chronic back and abdominal pain, steatorrhea and short bowel syndrome. The patient has been left with ~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 attached to proximal transverse colon through rectum. 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; 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; jejunal to transverse colon anastomosis Treated at [facility] SB lengthening to 165cm Dr Alene Mires, [facility]   Vitamin B12 deficiency     Past Surgical History:  Procedure Laterality Date   ABDOMINAL ADHESION SURGERY  01/22/2018   [place]  11/26/2019   Procedure: BILIARY DILATION;  Surgeon: Jackquline Denmark, MD;  Location: [facility];  Service: Endoscopy;;   BILIARY DILATION  03/08/2020   Procedure: BILIARY DILATION;  Surgeon: Irving Copas., MD;  Location: [place];  Service: Gastroenterology;;   BIOPSY  03/08/2020   Procedure: BIOPSY;  Surgeon: Irving Copas., MD;  Location: [place];  Service: Gastroenterology;;   CHEST WALL RESECTION     right thoracotomy,resection of chest mass with anterior rib and reconstruction using  prosthetic mesh and video arthroscopy   CHOLECYSTECTOMY  01/22/2018   COLONOSCOPY  2019   ENTEROSTOMY CLOSURE  04/1999   ERCP N/A 11/26/2019   Procedure: ENDOSCOPIC RETROGRADE CHOLANGIOPANCREATOGRAPHY (ERCP);  Surgeon: Jackquline Denmark, MD;  Location: [facility];  Service: Endoscopy;  Laterality: N/A;   ERCP N/A 03/08/2020   Procedure: ENDOSCOPIC RETROGRADE CHOLANGIOPANCREATOGRAPHY (ERCP);  Surgeon: Irving Copas., MD;  Location: [place];  Service: Gastroenterology;  Laterality: N/A;   ESOPHAGOGASTRODUODENOSCOPY N/A 03/08/2020   Procedure: ESOPHAGOGASTRODUODENOSCOPY (EGD);  Surgeon: Irving Copas., MD;  Location: [place];  Service: Gastroenterology;  Laterality: N/A;   EUS N/A 03/08/2020   Procedure: UPPER ENDOSCOPIC ULTRASOUND (EUS) LINEAR;  Surgeon: Irving Copas., MD;  Location: [place];  Service: Gastroenterology;  Laterality: N/A;   ILEOCECETOMY  03/1999   ileocolon resection with abdominal stoma   ILEOSTOMY CLOSURE  2001   IR FLUORO GUIDE CV LINE LEFT  01/07/2020   IR FLUORO GUIDE CV LINE LEFT  03/09/2020   IR FLUORO GUIDE CV LINE LEFT  05/09/2020   IR FLUORO GUIDE CV LINE LEFT  07/20/2020   IR FLUORO GUIDE CV LINE RIGHT  08/05/2020   IR FLUORO GUIDE CV LINE RIGHT  04/03/2021   IR PTA VENOUS EXCEPT DIALYSIS CIRCUIT  01/07/2020   IR REMOVAL TUN CV CATH W/O FL  08/05/2020   IR US GUIDE VASC ACCESS LEFT     x 2 06/17/19 and 09/14/2019   IR US GUIDE VASC ACCESS RIGHT  08/05/2020   KNEE SURGERY     right knee    LAPAROSCOPIC SMALL BOWEL RESECTION  2009   2000-2009.  SB resections for Crohns Disease - now with Short gut   OMENTECTOMY  01/22/2018   PARTIAL HYSTERECTOMY  1984   with LSO   REMOVAL OF STONES  11/26/2019   Procedure: REMOVAL OF STONES;  Surgeon: Jackquline Denmark, MD;  Location: [facility];  Service: Endoscopy;;   REMOVAL OF STONES  03/08/2020   Procedure: REMOVAL OF STONES;  Surgeon: Irving Copas., MD;  Location: [facility];  Service: Gastroenterology;;   SALPINGOOPHORECTOMY Left 1984   SALPINGOOPHORECTOMY Right 1990   SERIAL TRANSVERSE ENTEROPLASTY (STEP) - SMALL BOWEL LENGTHENING  01/22/2018   Dr Alene Mires, [facility] - SB length from 120 to 165cm    SMALL INTESTINE SURGERY  2002   SMALL INTESTINE SURGERY  2003   SPHINCTEROTOMY  11/26/2019   Procedure: SPHINCTEROTOMY;  Surgeon: Jackquline Denmark, MD;  Location: [facility];   Service: Endoscopy;;   TOTAL ABDOMINAL HYSTERECTOMY  1990   with RSO   UPPER GASTROINTESTINAL ENDOSCOPY      Social History:  reports that she has quit smoking. She has never used smokeless tobacco. She reports previous alcohol use. She reports that she does not use drugs.  Allergies: Allergies  Allergen Reactions   Meperidine Hives    Other reaction(s): GI Upset Due to [place]    Hyoscyamine Hives and Swelling    Legs swelling   Disorientation   Cefepime Other (See Comments)    Neurotoxicity occurring in setting of AKI. Ceftriaxone tolerated during same admit   Gabapentin Other (See Comments)    unknown   Lyrica [Pregabalin] Other (See Comments)    unknown   Topamax [Topiramate] Other (See Comments)    unknown   Zosyn [Piperacillin Sod-Tazobactam So]     Patient reports it makes her vomit, her neck stiff, and her "heart feel funny"   Fentanyl Rash    Pt is allergic  to fentanyl patch related to the glue (gives her a rash) Pt states she is NOT allergic to fentanyl IV medicine   Morphine And Related Rash    Family History:  Family History  Problem Relation Age of Onset   Seizures Mother    Glaucoma Mother    CAD Father    Heart disease Father    Hypertension Father    Breast cancer Sister    Multiple sclerosis Sister    Diabetes Sister    Lupus Sister    Colon cancer Other    Crohn's disease Other      Current Outpatient Medications:    acetaminophen (TYLENOL) 325 MG tablet, Take 650 mg by mouth every 6 (six) hours as needed for mild pain. , Disp: , Rfl:    amLODipine (NORVASC) 10 MG tablet, Take 10 mg by mouth daily., Disp: , Rfl:    budesonide (ENTOCORT EC) 3 MG 24 hr capsule, Take 3 capsules (9 mg total) by mouth daily., Disp: 90 capsule, Rfl: 0   buPROPion (WELLBUTRIN SR) 100 MG 12 hr tablet, Take 200 mg by mouth daily., Disp: , Rfl:    Calcium 200 MG TABS, Take 200 mg by mouth daily., Disp: , Rfl:    carvedilol (COREG) 25 MG tablet, Take 1 tablet (25 mg  total) by mouth 2 (two) times daily with a meal., Disp: 60 tablet, Rfl: 0   cholecalciferol (VITAMIN D3) 25 MCG (1000 UT) tablet, Take 1,000 Units by mouth daily. , Disp: , Rfl:    cycloSPORINE (RESTASIS) 0.05 % ophthalmic emulsion, Place 1 drop into both eyes 2 (two) times daily., Disp: , Rfl:    denosumab (PROLIA) 60 MG/ML SOSY injection, Inject 60 mg into the skin every 6 (six) months., Disp: , Rfl:    dexlansoprazole (DEXILANT) 60 MG capsule, Take 1 capsule (60 mg total) by mouth daily., Disp: 90 capsule, Rfl: 1   Dextran 70-Hypromellose 0.1-0.3 % SOLN, Place 1 drop into both eyes 4 (four) times daily., Disp: , Rfl:    diphenoxylate-atropine (LOMOTIL) 2.5-0.025 MG tablet, TAKE 1 TABLET BY MOUTH 4 TIMES DAILY AS NEEDED FOR DIARRHEA OR  LOOSE  STOOLS, Disp: 90 tablet, Rfl: 1   DULoxetine (CYMBALTA) 30 MG capsule, TAKE 3 CAPSULES BY MOUTH ONCE DAILY, Disp: 90 capsule, Rfl: 0   estradiol (ESTRACE) 2 MG tablet, Take 1 tablet (2 mg total) by mouth daily., Disp: 90 tablet, Rfl: 1   famotidine (PEPCID) 20 MG tablet, Take 20 mg by mouth daily as needed for heartburn or indigestion., Disp: , Rfl:    GATTEX 5 MG KIT, Inject 5 mLs into the skin daily., Disp: , Rfl:    hydrALAZINE (APRESOLINE) 100 MG tablet, Take 1 tablet (100 mg total) by mouth 3 (three) times daily., Disp: 90 tablet, Rfl: 0   HYDROmorphone (DILAUDID) 4 MG tablet, Take 4 mg by mouth in the morning, at noon, in the evening, and at bedtime., Disp: , Rfl:    levETIRAcetam (KEPPRA) 500 MG tablet, Take 500 mg by mouth 2 (two) times daily., Disp: , Rfl:    lipase/protease/amylase (CREON) 36000 UNITS CPEP capsule, Take 1 capsule (36,000 Units total) by mouth 3 (three) times daily as needed (with meals for digestion)., Disp: 180 capsule, Rfl: 0   Menthol, Topical Analgesic, (ICY HOT BACK EX), Apply 1 application topically daily as needed (back pain)., Disp: , Rfl:    methadone (DOLOPHINE) 5 MG tablet, Take 1 tablet (5 mg total) by mouth 5 (five)  times daily.,  Disp: 30 tablet, Rfl: 0   methocarbamol (ROBAXIN) 500 MG tablet, Take 1 tablet (500 mg total) by mouth in the morning and at bedtime., Disp: 60 tablet, Rfl: 1   Multiple Vitamins-Minerals (MULTIVITAMIN ADULT PO), Take 1 tablet by mouth daily., Disp: , Rfl:    NARCAN 4 MG/0.1ML LIQD nasal spray kit, Place 1 spray into the nose once as needed (overdose)., Disp: , Rfl:    PRESCRIPTION MEDICATION, Inject 1 each into the vein daily. Home TPN . [facility] . 1 bag for 12 hours. [phone], Disp: , Rfl:    Probiotic Product (PROBIOTIC-10 PO), Take 1 capsule by mouth daily. , Disp: , Rfl:    promethazine (PHENERGAN) 25 MG tablet, TAKE 1 TABLET BY MOUTH EVERY 6 HOURS AS NEEDED FOR NAUSEA FOR VOMITING, Disp: 90 tablet, Rfl: 2   sodium chloride 0.9 % infusion, Inject 1 mL into the vein daily as needed (flush)., Disp: , Rfl:    sucralfate (CARAFATE) 1 GM/10ML suspension, Take 10 mLs (1 g total) by mouth 4 (four) times daily -  with meals and at bedtime., Disp: 420 mL, Rfl: 2   Trace Minerals Cu-Mn-Se-Zn (TRALEMENT IV), Inject 1 mL into the vein See admin instructions. Used in TPN bag 4 times weekly, Disp: , Rfl:    vitamin B-12 (CYANOCOBALAMIN) 1000 MCG tablet, Take 1,000 mcg by mouth daily. , Disp: , Rfl:    loperamide (IMODIUM) 2 MG capsule, TAKE 1 CAPSULE BY MOUTH AS NEEDED FOR DIARRHEA OR LOOSE STOOLS (Patient not taking: Reported on 06/13/2021), Disp: 60 capsule, Rfl: 2  Current Facility-Administered Medications:    methylPREDNISolone acetate (DEPO-MEDROL) injection 40 mg, 40 mg, Intramuscular, Once, [name], [name], MD  Review of Systems:  Constitutional: Denies fever, chills, diaphoresis, appetite change and fatigue.  HEENT: Denies photophobia, eye pain, redness, hearing loss, ear pain, congestion, sore throat, rhinorrhea, sneezing, mouth sores, trouble swallowing, neck pain, neck stiffness and tinnitus.   Respiratory: Denies SOB, DOE, cough, chest  tightness,  and wheezing.   Cardiovascular: Denies chest pain, palpitations and leg swelling.  Gastrointestinal: Denies constipation, blood in stool and abdominal distention.  Genitourinary: Denies dysuria, urgency, frequency, hematuria, flank pain and difficulty urinating.  Endocrine: Denies: hot or cold intolerance, sweats, changes in hair or nails, polyuria, polydipsia. Musculoskeletal: Denies gait problem.  Skin: Denies pallor, rash and wound.  Neurological: Denies dizziness, seizures, syncope, weakness, light-headedness, numbness and headaches.  Hematological: Denies adenopathy. Easy bruising, personal or family bleeding history  Psychiatric/Behavioral: Denies suicidal ideation, mood changes, confusion, nervousness, sleep disturbance and agitation    Physical Exam: Vitals:   06/13/21 1428  BP: 138/78  Pulse: 95  Temp: (!) 97.3 F (36.3 C)  TempSrc: Oral  SpO2: 98%    There is no height or weight on file to calculate BMI.   Constitutional: NAD, calm, she appears to be in significant distress due to left shoulder pain. Eyes: PERRL, lids and conjunctivae normal ENMT: Mucous membranes are moist.  Respiratory: clear to auscultation bilaterally, no wheezing, no crackles. Normal respiratory effort. No accessory muscle use.  Cardiovascular: Regular rate and rhythm, no murmurs / rubs / gallops. No extremity edema.  Psychiatric: Normal judgment and insight. Alert and oriented x 3. Normal mood.    Impression and Plan:  Hospital discharge follow-up  Acute pain of left shoulder - Plan: methylPREDNISolone acetate (DEPO-MEDROL) injection 40 mg, Ambulatory referral to Orthopedic Surgery, methocarbamol (ROBAXIN) 500 MG tablet  [facility] and ED charts have been reviewed  in great detail. -Given her significant left shoulder pain today with relatively normal recent work-up, I will go ahead and give her an IM methylprednisolone injection in office today, she will also be prescribed Robaxin  and an urgent referral to orthopedics has been placed.  Time spent: 31 minutes reviewing hospital charts, interviewing and examining patient and formulating plan of care.   Patient Instructions  -Nice seeing you today!!  -Steroid injection today.  -Take robaxin 500 mg at bedtime as needed. May also take an additional dose in the morning, caution as it may cause drowsiness.  -A referral to orthopedics has been placed.    [name], MD [facility]

## 2021-06-13 NOTE — Patient Instructions (Signed)
-  Nice seeing you today!!  -Steroid injection today.  -Take robaxin 500 mg at bedtime as needed. May also take an additional dose in the morning, caution as it may cause drowsiness.  -A referral to orthopedics has been placed.

## 2021-06-14 ENCOUNTER — Other Ambulatory Visit: Payer: Self-pay

## 2021-06-14 ENCOUNTER — Emergency Department (HOSPITAL_COMMUNITY)
Admission: EM | Admit: 2021-06-14 | Discharge: 2021-06-15 | Disposition: A | Discharge status: Home / Self Care | Payer: Medicare Other | Attending: Emergency Medicine | Admitting: Emergency Medicine

## 2021-06-14 DIAGNOSIS — N183 Chronic kidney disease, stage 3 unspecified: Secondary | ICD-10-CM | POA: Insufficient documentation

## 2021-06-14 DIAGNOSIS — R112 Nausea with vomiting, unspecified: Secondary | ICD-10-CM | POA: Diagnosis present

## 2021-06-14 DIAGNOSIS — Z87891 Personal history of nicotine dependence: Secondary | ICD-10-CM | POA: Insufficient documentation

## 2021-06-14 DIAGNOSIS — Z79899 Other long term (current) drug therapy: Secondary | ICD-10-CM | POA: Insufficient documentation

## 2021-06-14 DIAGNOSIS — R197 Diarrhea, unspecified: Secondary | ICD-10-CM | POA: Diagnosis not present

## 2021-06-14 DIAGNOSIS — I129 Hypertensive chronic kidney disease with stage 1 through stage 4 chronic kidney disease, or unspecified chronic kidney disease: Secondary | ICD-10-CM | POA: Insufficient documentation

## 2021-06-14 DIAGNOSIS — R1084 Generalized abdominal pain: Secondary | ICD-10-CM | POA: Insufficient documentation

## 2021-06-14 DIAGNOSIS — Z8616 Personal history of COVID-19: Secondary | ICD-10-CM | POA: Insufficient documentation

## 2021-06-14 LAB — URINE CULTURE: Culture: NO GROWTH

## 2021-06-14 NOTE — ED Triage Notes (Signed)
Pt arrived via EMS for c/o Crohn's flare up that has been ongoing for a few days now, but today she began with n/v with abdominal pain 10/10.  EMS reports vitals: 172/84, Not able to keep down bp meds today.  HR 68, RR 20, CBG 110, 99.7 temp, Spo2 98% on ra.

## 2021-06-15 DIAGNOSIS — R112 Nausea with vomiting, unspecified: Secondary | ICD-10-CM | POA: Diagnosis not present

## 2021-06-15 LAB — COMPREHENSIVE METABOLIC PANEL
ALT: 23 U/L (ref 0–44)
AST: 17 U/L (ref 15–41)
Albumin: 3.3 g/dL — ABNORMAL LOW (ref 3.5–5.0)
Alkaline Phosphatase: 112 U/L (ref 38–126)
Anion gap: 13 (ref 5–15)
BUN: 28 mg/dL — ABNORMAL HIGH (ref 6–20)
CO2: 20 mmol/L — ABNORMAL LOW (ref 22–32)
Calcium: 9.3 mg/dL (ref 8.9–10.3)
Chloride: 108 mmol/L (ref 98–111)
Creatinine, Ser: 1.39 mg/dL — ABNORMAL HIGH (ref 0.44–1.00)
GFR, Estimated: 43 mL/min — ABNORMAL LOW (ref >60)
Glucose, Bld: 109 mg/dL — ABNORMAL HIGH (ref 70–99)
Potassium: 3.6 mmol/L (ref 3.5–5.1)
Sodium: 141 mmol/L (ref 135–145)
Total Bilirubin: 0.7 mg/dL (ref 0.3–1.2)
Total Protein: 7.6 g/dL (ref 6.5–8.1)

## 2021-06-15 LAB — CBC WITH DIFFERENTIAL/PLATELET
Abs Immature Granulocytes: 0.01 10*3/uL (ref 0.00–0.07)
Basophils Absolute: 0 10*3/uL (ref 0.0–0.1)
Basophils Relative: 0 %
Eosinophils Absolute: 0.1 10*3/uL (ref 0.0–0.5)
Eosinophils Relative: 1 %
HCT: 32.4 % — ABNORMAL LOW (ref 36.0–46.0)
Hemoglobin: 10.9 g/dL — ABNORMAL LOW (ref 12.0–15.0)
Immature Granulocytes: 0 %
Lymphocytes Relative: 29 %
Lymphs Abs: 2 10*3/uL (ref 0.7–4.0)
MCH: 30.6 pg (ref 26.0–34.0)
MCHC: 33.6 g/dL (ref 30.0–36.0)
MCV: 91 fL (ref 80.0–100.0)
Monocytes Absolute: 0.2 10*3/uL (ref 0.1–1.0)
Monocytes Relative: 3 %
Neutro Abs: 4.4 10*3/uL (ref 1.7–7.7)
Neutrophils Relative %: 67 %
Platelets: 243 10*3/uL (ref 150–400)
RBC: 3.56 MIL/uL — ABNORMAL LOW (ref 3.87–5.11)
RDW: 13.9 % (ref 11.5–15.5)
WBC: 6.7 10*3/uL (ref 4.0–10.5)
nRBC: 0 % (ref 0.0–0.2)

## 2021-06-15 LAB — URINALYSIS, ROUTINE W REFLEX MICROSCOPIC
Bacteria, UA: NONE SEEN
Bilirubin Urine: NEGATIVE
Glucose, UA: NEGATIVE mg/dL
Hgb urine dipstick: NEGATIVE
Ketones, ur: NEGATIVE mg/dL
Leukocytes,Ua: NEGATIVE
Nitrite: NEGATIVE
Protein, ur: >=300 mg/dL — AB
Specific Gravity, Urine: 1.025 (ref 1.005–1.030)
pH: 6 (ref 5.0–8.0)

## 2021-06-15 MED ORDER — HYDRALAZINE HCL 25 MG PO TABS
100.0000 mg | ORAL_TABLET | Freq: Once | ORAL | Status: AC
  Administered 2021-06-15: 100 mg via ORAL
  Filled 2021-06-15: qty 4

## 2021-06-15 MED ORDER — ONDANSETRON HCL 4 MG/2ML IJ SOLN
4.0000 mg | Freq: Once | INTRAMUSCULAR | Status: AC
  Administered 2021-06-15: 4 mg via INTRAVENOUS
  Filled 2021-06-15: qty 2

## 2021-06-15 MED ORDER — HYDROMORPHONE HCL 1 MG/ML IJ SOLN
1.0000 mg | Freq: Once | INTRAMUSCULAR | Status: AC
  Administered 2021-06-15: 1 mg via INTRAVENOUS
  Filled 2021-06-15: qty 1

## 2021-06-15 MED ORDER — SODIUM CHLORIDE 0.9 % IV BOLUS
1000.0000 mL | Freq: Once | INTRAVENOUS | Status: AC
  Administered 2021-06-15: 1000 mL via INTRAVENOUS

## 2021-06-15 NOTE — ED Notes (Signed)
Pt tolerated PO challenge well.

## 2021-06-15 NOTE — Discharge Instructions (Addendum)
Call your primary care doctor or specialist as discussed in the next 2-3 days.   Return immediately back to the ER if:  Your symptoms worsen within the next 12-24 hours. You develop new symptoms such as new fevers, persistent vomiting, new pain, shortness of breath, or new weakness or numbness, or if you have any other concerns.  

## 2021-06-15 NOTE — ED Provider Notes (Signed)
[facility] DEPT Provider Note   CSN: [id number] Arrival date & time: 06/14/21  2031     History Chief Complaint  Patient presents with   Abdominal Pain    Caitlin Peters Peters is a 60 y.o. female.  Patient presents for abdominal pain.  There is ongoing for several weeks per patient.  She states that has not been improving and presents back to the ER.  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  Seen in the ER 3 days ago for similar concerns.  Denies fevers.  Denies cough.  Positive vomiting.  Positive diarrhea.  These are described as nonbloody per patient.      Past Medical History:  Diagnosis Date   Acute pancreatitis 04/13/2020   Anasarca 10/2019   AVN (avascular necrosis of bone) ([name])    Cataract    Choledocholithiasis (sludge) s/p ERCP 10/2019 10/21/2020   Chronic pain syndrome    CKD (chronic kidney disease), stage III ([place])    Crohn disease (Caitlin Peters)    Crohn's disease of small & large intestine with SGS 1984   Caitlin Peters Peters is a 60 year old female with a history of Crohn's disease diagnosed in 25 (age 71), history of long-term steroid use with osteoporosis, S/P multiple bowel resections ([id number]) complicated by chronic back and abdominal pain, steatorrhea and short bowel syndrome. The patient has been left with ~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 attached to proximal transverse colon through rectum. 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; 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; jejunal to transverse colon anastomosis Treated at [facility] lengthening to 165cm Dr Alene Mires, [place] [facility]     Patient Active Problem List   Diagnosis Date Noted   Chronic idiopathic constipation    Left shoulder pain 05/23/2021   Elevated LFTs    Sepsis ([place]) 05/06/2021   Acidosis 04/08/2021   Colitis 04/07/2021   Intractable nausea and vomiting 03/11/2021   Thoracic discitis 02/10/2021   Pressure injury of skin 12/18/2020   Bacteremia 12/17/2020   Seizures ([place]) 12/17/2020   Drug-seeking behavior 12/16/2020   Seizure ([place]) 12/15/2020   CKD (chronic kidney disease) stage 3, GFR 30-59 ml/min ([place]) 12/06/2020   COVID-19 virus infection 12/06/2020   Protein calorie malnutrition ([place]) 12/06/2020   Palpitations 11/22/2020   PAC (premature atrial contraction) 11/22/2020   History of central line-associated bloodstream infection ([facility]SI) 10/21/2020   Nausea & vomiting 10/21/2020   Choledocholithiasis (sludge) s/p ERCP 10/2019 10/21/2020   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   Abdominal pain 03/40/3524   Acute metabolic encephalopathy    Hypomagnesemia    Partial small bowel obstruction ([place])    Bacteremia associated with intravascular line ([place]) 08/04/2020   Enterobacter sepsis ([place]) 07/22/2020   Fever of unknown origin 06/27/2020   Anxiety 06/03/2020   Acute pancreatitis 04/13/2020   Infection due to Acinetobacter species 03/10/2020   Pancytopenia ([place]) 03/05/2020   Central line infection    Elevated liver enzymes 01/02/2020   Cholangitis    Anasarca 10/28/2019  Acute kidney injury superimposed on chronic kidney disease ([place]) 10/28/2019   Falls 10/28/2019   Malnutrition ([place])    [name]    GERD (gastroesophageal reflux disease)    Chronic pain syndrome    Hypokalemia due to excessive gastrointestinal loss of potassium 10/13/2019   Fever    Hypokalemia 10/12/2019   Chronic diarrhea 10/12/2019   Dysuria 10/12/2019   Bilateral lower extremity edema 10/12/2019   AKI (acute kidney injury) ([place]) 10/12/2019   Fungemia 08/27/2019   [name] 11/03/2018   Dilation of biliary tract 08/28/2018   Severe diarrhea 03/07/2018   LFTs abnormal 01/27/2018   GI tract obstruction ([place]) 01/27/2018   Gram-negative bacteremia 09/13/2017   HTN (hypertension), benign 12/02/2016   Intractable pain 12/02/2016   Anemia 12/02/2016   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   Avascular necrosis ([place]) 06/14/2015   Polyarthralgia 05/03/2015   On total parenteral nutrition (TPN) 12/30/2014   Osteoporosis 12/24/2014   Low back pain 12/16/2014   [name] 12/16/2014   Short gut syndrome 07/15/2014   History of colonic diverticulitis 2014   Depression 07/24/2012   Gram-positive bacteremia 07/24/2012   Small bowel obstruction due to adhesions ([place]) 07/24/2012   Diarrhea 12/13/2011   Neuralgia and neuritis 06/01/2011   Myalgia and myositis 08/12/2003   Crohn's disease of small & large intestine with SGS 1984    Past Surgical History:  Procedure Laterality Date   ABDOMINAL ADHESION SURGERY  01/22/2018   APPENDECTOMY  1989   BILIARY DILATION  11/26/2019   Procedure: BILIARY DILATION;  Surgeon: Jackquline Denmark, MD;  Location: [facility];  Service: Endoscopy;;   BILIARY DILATION  03/08/2020   Procedure: BILIARY DILATION;  Surgeon: Irving Copas., MD;  Location: [facility];  Service: Gastroenterology;;   BIOPSY  03/08/2020   Procedure: BIOPSY;  Surgeon: Irving Copas., MD;  Location[facility]OS[facility];  Service: Gastroenterology;;   CHEST WALL RESECTION     right thoracotomy,resection of chest mass with anterior rib and reconstruction using prosthetic mesh and video arthroscopy   CHOLECYSTECTOMY  01/22/2018   COLONOSCOPY  2019   ENTEROSTOMY CLOSURE  04/1999   ERCP N/A 11/26/2019   Procedure: ENDOSCOPIC RETROGRADE CHOLANGIOPANCREATOGRAPHY (ERCP);  Surgeon: Jackquline Denmark, MD;  Location: [facility];  Service: Endoscopy;  Laterality: N/A;   ERCP N/A 03/08/2020   Procedure: ENDOSCOPIC RETROGRADE  CHOLANGIOPANCREATOGRAPHY (ERCP);  Surgeon: Irving Copas., MD;  Location: [place] [facility];  Service: Gastroenterology;  Laterality: N/A;   ESOPHAGOGASTRODUODENOSCOPY N/A 03/08/2020   Procedure: ESOPHAGOGASTRODUODENOSCOPY (EGD);  Surgeon: Irving Copas., MD;  Location: [place];  Service: Gastroenterology;  Laterality: N/A;   EUS N/A 03/08/2020   Procedure: UPPER ENDOSCOPIC ULTRASOUND (EUS) LINEAR;  Surgeon: Irving Copas., MD;  Location: [facility];  Service: Gastroenterology;  Laterality: N/A;   ILEOCECETOMY  03/1999   ileocolon resection with abdominal stoma   ILEOSTOMY CLOSURE  2001   IR FLUORO GUIDE CV LINE LEFT  01/07/2020   IR FLUORO GUIDE CV LINE LEFT  03/09/2020   IR FLUORO GUIDE CV LINE LEFT  05/09/2020   IR FLUORO GUIDE CV LINE LEFT  07/20/2020   IR FLUORO GUIDE CV LINE RIGHT  08/05/2020   IR FLUORO GUIDE CV LINE RIGHT  04/03/2021   IR PTA VENOUS EXCEPT DIALYSIS CIRCUIT  01/07/2020   IR REMOVAL TUN CV CATH W/O FL  08/05/2020   IR US GUIDE VASC ACCESS LEFT     x 2 06/17/19 and 09/14/2019   IR US GUIDE  VASC ACCESS RIGHT  08/05/2020   KNEE SURGERY     right knee    LAPAROSCOPIC SMALL BOWEL RESECTION  2009   2000-2009.  SB resections for Crohns Disease - now with Short gut   OMENTECTOMY  01/22/2018   PARTIAL HYSTERECTOMY  1984   with LSO   REMOVAL OF STONES  11/26/2019   Procedure: REMOVAL OF STONES;  Surgeon: Jackquline Denmark, MD;  Location: [facility];  Service: Endoscopy;;   REMOVAL OF STONES  03/08/2020   Procedure: REMOVAL OF STONES;  Surgeon: Irving Copas., MD;  Location: [facility] ENDOSCOPY;  Service: Gastroenterology;;   SALPINGOOPHORECTOMY Left 1984   SALPINGOOPHORECTOMY Right 1990   SERIAL TRANSVERSE ENTEROPLASTY (STEP) - SMALL BOWEL LENGTHENING  01/22/2018   Dr Alene Mires, [facility] - SB length from 120 to 165cm    SMALL INTESTINE SURGERY  2002   SMALL INTESTINE SURGERY  2003   SPHINCTEROTOMY  11/26/2019   Procedure: SPHINCTEROTOMY;   Surgeon: Jackquline Denmark, MD;  Location: [facility];  Service: Endoscopy;;   TOTAL ABDOMINAL HYSTERECTOMY  1990   with RSO   UPPER GASTROINTESTINAL ENDOSCOPY       OB History   No obstetric history on file.     Family History  Problem Relation Age of Onset   Seizures Mother    Glaucoma Mother    CAD Father    Heart disease Father    Hypertension Father    Breast cancer Sister    Multiple sclerosis Sister    Diabetes Sister    Lupus Sister    Colon cancer Other    Crohn's disease Other     Social History   Tobacco Use   Smoking status: Former   Smokeless tobacco: Never  Scientific laboratory technician Use: Never used  Substance Use Topics   Alcohol use: Not Currently   Drug use: Never    Home Medications Prior to Admission medications   Medication Sig Start Date End Date Taking? Authorizing Provider  acetaminophen (TYLENOL) 325 MG tablet Take 650 mg by mouth every 6 (six) hours as needed for mild pain.     [provider]  amLODipine (NORVASC) 10 MG tablet Take 10 mg by mouth daily.    [provider]  budesonide (ENTOCORT EC) 3 MG 24 hr capsule Take 3 capsules (9 mg total) by mouth daily. 03/06/21   [name], [name], MD  bu[facility] SR) 100 MG 12 hr tablet Take 200 mg by mouth daily. 03/02/21   [provider]  Calcium 200 MG TABS Take 200 mg by mouth daily.    [provider]  carvedilol (COREG) 25 MG tablet Take 1 tablet (25 mg total) by mouth 2 (two) times daily with a meal. 05/16/21 06/15/21  Darliss Cheney, MD  cholecalciferol (VITAMIN D3) 25 MCG (1000 UT) tablet Take 1,000 Units by mouth daily.     [provider]  cycloSPORINE (RESTASIS) 0.05 % ophthalmic emulsion Place 1 drop into both eyes 2 (two) times daily.    [provider]  denosumab (PROLIA) 60 MG/ML SOSY injection Inject 60 mg into the skin every 6 (six) months.    [provider]  dexlansoprazole (DEXILANT) 60 MG capsule Take 1 capsule  (60 mg total) by mouth daily. 12/28/20   [name], [name], MD  Dextran 70-Hypromellose 0.1-0.3 % SOLN Place 1 drop into both eyes 4 (four) times daily.    [provider]  diphenoxylate-atropine (LOMOTIL) 2.5-0.025 MG tablet TAKE  1 TABLET BY MOUTH 4 TIMES DAILY AS NEEDED FOR DIARRHEA OR  LOOSE  STOOLS 12/28/20   [name], [name], MD  DULoxetine (CYMBALTA) 30 MG capsule TAKE 3 CAPSULES BY MOUTH ONCE DAILY 03/22/21   [name], [name], MD  estradiol (ESTRACE) 2 MG tablet Take 1 tablet (2 mg total) by mouth daily. 12/07/20   [name], [name], MD  famotidine (PEPCID) 20 MG tablet Take 20 mg by mouth daily as needed for heartburn or indigestion.    [provider]  GATTEX 5 MG KIT Inject 5 mLs into the skin daily. 01/03/21   [provider]  hydrALAZINE (APRESOLINE) 100 MG tablet Take 1 tablet (100 mg total) by mouth 3 (three) times daily. 05/16/21 06/15/21  Darliss Cheney, MD  HYDROmorphone (DILAUDID) 4 MG tablet Take 4 mg by mouth in the morning, at noon, in the evening, and at bedtime. 04/18/21   [provider]  levETIRAcetam (KEPPRA) 500 MG tablet Take 500 mg by mouth 2 (two) times daily.    [provider]  lipase/protease/amylase (CREON) 36000 UNITS CPEP capsule Take 1 capsule (36,000 Units total) by mouth 3 (three) times daily as needed (with meals for digestion). 06/28/20   [name], [name], MD  loperamide (IMODIUM) 2 MG capsule TAKE 1 CAPSULE BY MOUTH AS NEEDED FOR DIARRHEA OR LOOSE STOOLS Patient not taking: Reported on 06/13/2021 06/01/21   [name], [name], MD  Menthol, Topical Analgesic, (ICY HOT BACK EX) Apply 1 application topically daily as needed (back pain).    [provider]  methadone (DOLOPHINE) 5 MG tablet Take 1 tablet (5 mg total) by mouth 5 (five) times daily. 06/30/20   Florencia Reasons, MD  methocarbamol (ROBAXIN) 500 MG tablet Take 1 tablet (500 mg total) by mouth in the morning and at  bedtime. 06/13/21   [name], [name], MD  Multiple Vitamins-Minerals (MULTIVITAMIN ADULT PO) Take 1 tablet by mouth daily.    [provider]  NARCAN 4 MG/0.1ML LIQD nasal spray kit Place 1 spray into the nose once as needed (overdose). 10/14/20   [provider]  PRESCRIPTION MEDICATION Inject 1 each into the vein daily. Home TPN . [facility] [place] . 1 bag for 12 hours. [phone]    [provider]  Probiotic Product (PROBIOTIC-10 PO) Take 1 capsule by mouth daily.     [provider]  promethazine (PHENERGAN) 25 MG tablet TAKE 1 TABLET BY MOUTH EVERY 6 HOURS AS NEEDED FOR NAUSEA FOR VOMITING 06/01/21   [name], [name], MD  sodium chloride 0.9 % infusion Inject 1 mL into the vein daily as needed (flush). 10/16/20   [provider]  sucralfate (CARAFATE) 1 GM/10ML suspension Take 10 mLs (1 g total) by mouth 4 (four) times daily -  with meals and at bedtime. 06/28/20   [name], [name], MD  Trace Minerals Cu-Mn-Se-Zn (TRALEMENT IV) Inject 1 mL into the vein See admin instructions. Used in TPN bag 4 times weekly    [provider]  vitamin B-12 (CYANOCOBALAMIN) 1000 MCG tablet Take 1,000 mcg by mouth daily.     [provider]    Allergies    Meperidine, Hyoscyamine, Cefepime, Gabapentin, Lyrica [pregabalin], Topamax [topiramate], Zosyn [piperacillin sod-tazobactam so], Fentanyl, and Morphine and related  Review of Systems   Review of Systems  Constitutional:  Negative for fever.  HENT:  Negative for ear pain.   Eyes:  Negative for  pain.  Respiratory:  Negative for cough.   Cardiovascular:  Negative for chest pain.  Gastrointestinal:  Positive for abdominal pain.  Genitourinary:  Negative for flank pain.  Musculoskeletal:  Negative for back pain.  Skin:  Negative for rash.  Neurological:  Negative for headaches.   Physical Exam Updated Vital Signs BP (!) 168/91   Pulse 69    Temp 98.4 F (36.9 C) (Oral)   Resp 19   Ht _0  (1.727 m)   Wt 59 kg   SpO2 100%   BMI 19.77 kg/m   Physical Exam Constitutional:      General: She is not in acute distress.    Appearance: Normal appearance.  HENT:     Head: Normocephalic.     Nose: Nose normal.  Eyes:     Extraocular Movements: Extraocular movements intact.  Cardiovascular:     Rate and Rhythm: Normal rate.  Pulmonary:     Effort: Pulmonary effort is normal.  Abdominal:     Comments: Abdomen is flat.  Mild to moderate diffuse tenderness on exam.  No guarding or rebound noted.  Musculoskeletal:        General: Normal range of motion.     Cervical back: Normal range of motion.  Neurological:     General: No focal deficit present.     Mental Status: She is alert. Mental status is at baseline.    ED Results / Procedures / Treatments   Labs (all labs ordered are listed, but only abnormal results are displayed) Labs Reviewed  CBC WITH DIFFERENTIAL/PLATELET - Abnormal; Notable for the following components:      Result Value   RBC 3.56 (*)    Hemoglobin 10.9 (*)    HCT 32.4 (*)    All other components within normal limits  COMPREHENSIVE METABOLIC PANEL - Abnormal; Notable for the following components:   CO2 20 (*)    Glucose, Bld 109 (*)    BUN 28 (*)    Creatinine, Ser 1.39 (*)    Albumin 3.3 (*)    GFR, Estimated 43 (*)    All other components within normal limits  URINALYSIS, ROUTINE W REFLEX MICROSCOPIC    EKG None  Radiology No results found.  Procedures Procedures   Medications Ordered in ED Medications  HYDROmorphone (DILAUDID) injection 1 mg (has no administration in time range)  HYDROmorphone (DILAUDID) injection 1 mg (1 mg Intravenous Given 06/15/21 0821)  ondansetron (ZOFRAN) injection 4 mg (4 mg Intravenous Given 06/15/21 0821)  ondansetron (ZOFRAN) injection 4 mg (4 mg Intravenous Given 06/15/21 0935)  sodium chloride 0.9 % bolus 1,000 mL (1,000 mLs Intravenous New Bag/Given  06/15/21 0935)  HYDROmorphone (DILAUDID) injection 1 mg (1 mg Intravenous Given 06/15/21 1004)  hydrALAZINE (APRESOLINE) tablet 100 mg (100 mg Oral Given 06/15/21 1122)    ED Course  I have reviewed the triage vital signs and the nursing notes.  Pertinent labs & imaging results that were available during my care of the patient were reviewed by me and considered in my medical decision making (see chart for details).    MDM Rules/Calculators/A&P                           Patient with extensive medical history Crohn's disease multiple bowel surgeries and recent ER visit, returns for repeat visit here.  Initial vital signs are mildly hypertensive but otherwise within normal limits.  Repeat work-up initiated labs ordered for comparison again.  These appear unremarkable.  We discussed with her the benefits of repeat scans she appears to have had multiple scans with the last one being done 3 days ago showing no acute changes.  Joint decision to forego scanning made at this time.  Patient given IV fluid hydration additional nausea medicines and pain medications with improvement of symptoms, tolerating p.o. medications now.  Discharged home in stable condition.  Advised follow-up with her GI physician within 1 to 2 days.    Final Clinical Impression(s) / ED Diagnoses Final diagnoses:  Nausea vomiting and diarrhea    Rx / DC Orders ED Discharge Orders     None        Luna Fuse, MD 06/15/21 1145

## 2021-06-15 NOTE — ED Notes (Signed)
Patient aware that urine sample has been ordered but is unable to provide a specimen at this time. Pt states that she will inform staff when she is able to give a sample.

## 2021-06-19 ENCOUNTER — Encounter: Payer: Self-pay | Admitting: Internal Medicine

## 2021-06-19 ENCOUNTER — Ambulatory Visit (INDEPENDENT_AMBULATORY_CARE_PROVIDER_SITE_OTHER): Payer: Medicare Other | Admitting: Internal Medicine

## 2021-06-19 ENCOUNTER — Emergency Department (HOSPITAL_COMMUNITY): Payer: Medicare Other

## 2021-06-19 ENCOUNTER — Inpatient Hospital Stay (HOSPITAL_COMMUNITY)
Admission: EM | Admit: 2021-06-19 | Discharge: 2021-06-30 | DRG: 872 | Disposition: A | Discharge status: Home Health | Payer: Medicare Other | Attending: Internal Medicine | Admitting: Internal Medicine

## 2021-06-19 ENCOUNTER — Other Ambulatory Visit: Payer: Self-pay

## 2021-06-19 VITALS — BP 147/72 | HR 96 | Temp 98.8°F | Wt 131.0 lb

## 2021-06-19 DIAGNOSIS — R61 Generalized hyperhidrosis: Secondary | ICD-10-CM | POA: Diagnosis present

## 2021-06-19 DIAGNOSIS — F32A Depression, unspecified: Secondary | ICD-10-CM | POA: Diagnosis not present

## 2021-06-19 DIAGNOSIS — Z832 Family history of diseases of the blood and blood-forming organs and certain disorders involving the immune mechanism: Secondary | ICD-10-CM

## 2021-06-19 DIAGNOSIS — Z8249 Family history of ischemic heart disease and other diseases of the circulatory system: Secondary | ICD-10-CM

## 2021-06-19 DIAGNOSIS — R509 Fever, unspecified: Secondary | ICD-10-CM

## 2021-06-19 DIAGNOSIS — M25512 Pain in left shoulder: Secondary | ICD-10-CM | POA: Diagnosis not present

## 2021-06-19 DIAGNOSIS — K50819 Crohn's disease of both small and large intestine with unspecified complications: Secondary | ICD-10-CM

## 2021-06-19 DIAGNOSIS — G894 Chronic pain syndrome: Secondary | ICD-10-CM | POA: Diagnosis present

## 2021-06-19 DIAGNOSIS — B9689 Other specified bacterial agents as the cause of diseases classified elsewhere: Secondary | ICD-10-CM | POA: Diagnosis present

## 2021-06-19 DIAGNOSIS — R7401 Elevation of levels of liver transaminase levels: Secondary | ICD-10-CM | POA: Diagnosis not present

## 2021-06-19 DIAGNOSIS — A419 Sepsis, unspecified organism: Secondary | ICD-10-CM | POA: Diagnosis present

## 2021-06-19 DIAGNOSIS — Z789 Other specified health status: Secondary | ICD-10-CM | POA: Diagnosis present

## 2021-06-19 DIAGNOSIS — N179 Acute kidney failure, unspecified: Secondary | ICD-10-CM | POA: Diagnosis present

## 2021-06-19 DIAGNOSIS — K5080 Crohn's disease of both small and large intestine without complications: Secondary | ICD-10-CM | POA: Diagnosis present

## 2021-06-19 DIAGNOSIS — Z20822 Contact with and (suspected) exposure to covid-19: Secondary | ICD-10-CM | POA: Diagnosis present

## 2021-06-19 DIAGNOSIS — F419 Anxiety disorder, unspecified: Secondary | ICD-10-CM | POA: Diagnosis not present

## 2021-06-19 DIAGNOSIS — K90829 Short bowel syndrome, unspecified: Secondary | ICD-10-CM | POA: Diagnosis present

## 2021-06-19 DIAGNOSIS — K50818 Crohn's disease of both small and large intestine with other complication: Secondary | ICD-10-CM

## 2021-06-19 DIAGNOSIS — K219 Gastro-esophageal reflux disease without esophagitis: Secondary | ICD-10-CM | POA: Diagnosis not present

## 2021-06-19 DIAGNOSIS — Z87891 Personal history of nicotine dependence: Secondary | ICD-10-CM

## 2021-06-19 DIAGNOSIS — M810 Age-related osteoporosis without current pathological fracture: Secondary | ICD-10-CM | POA: Diagnosis present

## 2021-06-19 DIAGNOSIS — Z8619 Personal history of other infectious and parasitic diseases: Secondary | ICD-10-CM | POA: Diagnosis not present

## 2021-06-19 DIAGNOSIS — E876 Hypokalemia: Secondary | ICD-10-CM | POA: Diagnosis present

## 2021-06-19 DIAGNOSIS — Z83511 Family history of glaucoma: Secondary | ICD-10-CM

## 2021-06-19 DIAGNOSIS — Z7952 Long term (current) use of systemic steroids: Secondary | ICD-10-CM

## 2021-06-19 DIAGNOSIS — N1831 Chronic kidney disease, stage 3a: Secondary | ICD-10-CM | POA: Diagnosis not present

## 2021-06-19 DIAGNOSIS — Z8616 Personal history of COVID-19: Secondary | ICD-10-CM | POA: Diagnosis not present

## 2021-06-19 DIAGNOSIS — E538 Deficiency of other specified B group vitamins: Secondary | ICD-10-CM | POA: Diagnosis not present

## 2021-06-19 DIAGNOSIS — Z820 Family history of epilepsy and other diseases of the nervous system: Secondary | ICD-10-CM

## 2021-06-19 DIAGNOSIS — I129 Hypertensive chronic kidney disease with stage 1 through stage 4 chronic kidney disease, or unspecified chronic kidney disease: Secondary | ICD-10-CM | POA: Diagnosis present

## 2021-06-19 DIAGNOSIS — D61818 Other pancytopenia: Secondary | ICD-10-CM | POA: Diagnosis present

## 2021-06-19 DIAGNOSIS — A4159 Other Gram-negative sepsis: Principal | ICD-10-CM | POA: Diagnosis present

## 2021-06-19 DIAGNOSIS — Z79899 Other long term (current) drug therapy: Secondary | ICD-10-CM | POA: Diagnosis not present

## 2021-06-19 DIAGNOSIS — Z888 Allergy status to other drugs, medicaments and biological substances status: Secondary | ICD-10-CM | POA: Diagnosis not present

## 2021-06-19 DIAGNOSIS — K912 Postsurgical malabsorption, not elsewhere classified: Secondary | ICD-10-CM | POA: Diagnosis present

## 2021-06-19 DIAGNOSIS — I10 Essential (primary) hypertension: Secondary | ICD-10-CM

## 2021-06-19 DIAGNOSIS — R6510 Systemic inflammatory response syndrome (SIRS) of non-infectious origin without acute organ dysfunction: Secondary | ICD-10-CM | POA: Diagnosis present

## 2021-06-19 DIAGNOSIS — R945 Abnormal results of liver function studies: Secondary | ICD-10-CM | POA: Diagnosis present

## 2021-06-19 DIAGNOSIS — Z833 Family history of diabetes mellitus: Secondary | ICD-10-CM

## 2021-06-19 DIAGNOSIS — R7989 Other specified abnormal findings of blood chemistry: Secondary | ICD-10-CM | POA: Diagnosis not present

## 2021-06-19 LAB — URINALYSIS, ROUTINE W REFLEX MICROSCOPIC
Bilirubin Urine: NEGATIVE
Glucose, UA: NEGATIVE mg/dL
Hgb urine dipstick: NEGATIVE
Ketones, ur: NEGATIVE mg/dL
Leukocytes,Ua: NEGATIVE
Nitrite: NEGATIVE
Protein, ur: 100 mg/dL — AB
Specific Gravity, Urine: 1.015 (ref 1.005–1.030)
pH: 6 (ref 5.0–8.0)

## 2021-06-19 LAB — CBC WITH DIFFERENTIAL/PLATELET
Abs Immature Granulocytes: 0.01 10*3/uL (ref 0.00–0.07)
Abs Immature Granulocytes: 0.01 10*3/uL (ref 0.00–0.07)
Basophils Absolute: 0 10*3/uL (ref 0.0–0.1)
Basophils Absolute: 0 10*3/uL (ref 0.0–0.1)
Basophils Relative: 0 %
Basophils Relative: 0 %
Eosinophils Absolute: 0 10*3/uL (ref 0.0–0.5)
Eosinophils Absolute: 0 10*3/uL (ref 0.0–0.5)
Eosinophils Relative: 0 %
Eosinophils Relative: 1 %
HCT: 21.8 % — ABNORMAL LOW (ref 36.0–46.0)
HCT: 23.2 % — ABNORMAL LOW (ref 36.0–46.0)
Hemoglobin: 7.2 g/dL — ABNORMAL LOW (ref 12.0–15.0)
Hemoglobin: 7.6 g/dL — ABNORMAL LOW (ref 12.0–15.0)
Immature Granulocytes: 0 %
Immature Granulocytes: 0 %
Lymphocytes Relative: 10 %
Lymphocytes Relative: 11 %
Lymphs Abs: 0.3 10*3/uL — ABNORMAL LOW (ref 0.7–4.0)
Lymphs Abs: 0.4 10*3/uL — ABNORMAL LOW (ref 0.7–4.0)
MCH: 30.4 pg (ref 26.0–34.0)
MCH: 30.5 pg (ref 26.0–34.0)
MCHC: 32.8 g/dL (ref 30.0–36.0)
MCHC: 33 g/dL (ref 30.0–36.0)
MCV: 92.4 fL (ref 80.0–100.0)
MCV: 92.8 fL (ref 80.0–100.0)
Monocytes Absolute: 0.1 10*3/uL (ref 0.1–1.0)
Monocytes Absolute: 0.2 10*3/uL (ref 0.1–1.0)
Monocytes Relative: 4 %
Monocytes Relative: 6 %
Neutro Abs: 2.4 10*3/uL (ref 1.7–7.7)
Neutro Abs: 3.2 10*3/uL (ref 1.7–7.7)
Neutrophils Relative %: 83 %
Neutrophils Relative %: 85 %
Platelets: 110 10*3/uL — ABNORMAL LOW (ref 150–400)
Platelets: 111 10*3/uL — ABNORMAL LOW (ref 150–400)
RBC: 2.36 MIL/uL — ABNORMAL LOW (ref 3.87–5.11)
RBC: 2.5 MIL/uL — ABNORMAL LOW (ref 3.87–5.11)
RDW: 14.6 % (ref 11.5–15.5)
RDW: 14.7 % (ref 11.5–15.5)
WBC: 2.9 10*3/uL — ABNORMAL LOW (ref 4.0–10.5)
WBC: 3.8 10*3/uL — ABNORMAL LOW (ref 4.0–10.5)
nRBC: 0 % (ref 0.0–0.2)
nRBC: 0 % (ref 0.0–0.2)

## 2021-06-19 LAB — COMPREHENSIVE METABOLIC PANEL
ALT: 365 U/L — ABNORMAL HIGH (ref 0–44)
AST: 544 U/L — ABNORMAL HIGH (ref 15–41)
Albumin: 2.6 g/dL — ABNORMAL LOW (ref 3.5–5.0)
Alkaline Phosphatase: 244 U/L — ABNORMAL HIGH (ref 38–126)
Anion gap: 9 (ref 5–15)
BUN: 45 mg/dL — ABNORMAL HIGH (ref 6–20)
CO2: 22 mmol/L (ref 22–32)
Calcium: 8.4 mg/dL — ABNORMAL LOW (ref 8.9–10.3)
Chloride: 105 mmol/L (ref 98–111)
Creatinine, Ser: 1.49 mg/dL — ABNORMAL HIGH (ref 0.44–1.00)
GFR, Estimated: 40 mL/min — ABNORMAL LOW (ref >60)
Glucose, Bld: 92 mg/dL (ref 70–99)
Potassium: 3.6 mmol/L (ref 3.5–5.1)
Sodium: 136 mmol/L (ref 135–145)
Total Bilirubin: 1 mg/dL (ref 0.3–1.2)
Total Protein: 5.7 g/dL — ABNORMAL LOW (ref 6.5–8.1)

## 2021-06-19 LAB — RESP PANEL BY RT-PCR (FLU A&B, COVID) ARPGX2
Influenza A by PCR: NEGATIVE
Influenza B by PCR: NEGATIVE
SARS Coronavirus 2 by RT PCR: NEGATIVE

## 2021-06-19 LAB — LACTIC ACID, PLASMA
Lactic Acid, Venous: 0.6 mmol/L (ref 0.5–1.9)
Lactic Acid, Venous: 0.6 mmol/L (ref 0.5–1.9)

## 2021-06-19 LAB — LIPASE, BLOOD: Lipase: 30 U/L (ref 11–51)

## 2021-06-19 LAB — APTT: aPTT: 30 seconds (ref 24–36)

## 2021-06-19 LAB — PROTIME-INR
INR: 1 (ref 0.8–1.2)
Prothrombin Time: 13 seconds (ref 11.4–15.2)

## 2021-06-19 MED ORDER — VANCOMYCIN HCL IN DEXTROSE 1-5 GM/200ML-% IV SOLN
1000.0000 mg | Freq: Once | INTRAVENOUS | Status: AC
  Administered 2021-06-20: 1000 mg via INTRAVENOUS
  Filled 2021-06-19: qty 200

## 2021-06-19 MED ORDER — SODIUM CHLORIDE 0.9 % IV SOLN
1.0000 g | Freq: Once | INTRAVENOUS | Status: AC
  Administered 2021-06-19: 1 g via INTRAVENOUS
  Filled 2021-06-19: qty 10

## 2021-06-19 MED ORDER — SODIUM CHLORIDE 0.9 % IV SOLN
1.0000 g | Freq: Once | INTRAVENOUS | Status: AC
  Administered 2021-06-20: 1 g via INTRAVENOUS
  Filled 2021-06-19: qty 10

## 2021-06-19 MED ORDER — ONDANSETRON HCL 4 MG/2ML IJ SOLN
4.0000 mg | Freq: Once | INTRAMUSCULAR | Status: AC
  Administered 2021-06-19: 4 mg via INTRAVENOUS
  Filled 2021-06-19: qty 2

## 2021-06-19 MED ORDER — HYDROMORPHONE HCL 1 MG/ML IJ SOLN
1.0000 mg | INTRAMUSCULAR | Status: DC | PRN
  Administered 2021-06-19 – 2021-06-30 (×83): 1 mg via INTRAVENOUS
  Filled 2021-06-19 (×86): qty 1

## 2021-06-19 MED ORDER — HYDROMORPHONE HCL 1 MG/ML IJ SOLN
1.0000 mg | Freq: Once | INTRAMUSCULAR | Status: AC
  Administered 2021-06-19: 1 mg via INTRAVENOUS
  Filled 2021-06-19: qty 1

## 2021-06-19 MED ORDER — LACTATED RINGERS IV BOLUS (SEPSIS)
1000.0000 mL | Freq: Once | INTRAVENOUS | Status: AC
  Administered 2021-06-19: 1000 mL via INTRAVENOUS

## 2021-06-19 NOTE — ED Triage Notes (Signed)
Patient BIB [facility] for weakness and fever. EMS reports that family took temp 102.0 and gave 1058m Tylenol. EMS gave 4082mNS in route. Patient is alert and oriented x4.Patient states when she gets fevers it is usually an infection in her PICC line.

## 2021-06-19 NOTE — ED Provider Notes (Signed)
[facility]aitlin [facility] DEPT Provider Note   CSN: [id number] Arrival date & time: 06/19/21  2011     History Chief Complaint  Patient presents with   Weakness    Fever per family    Caitlin Peters is a 60 y.o. female.  The history is provided by the patient and medical records.  Caitlin Peters is a 60 y.o. female who presents to the Emergency Department complaining of fever.   He presents to the ED for evaluation of fever to 102 that started this evening.  Has associated generalized weakness, body aches.  Received 1gm tylenol PTA. Received 400cc NS by EMS.  Has associated pelvic pain that she reports is new.  Feels frustrated.   Has diarrhea increased compared to her baseline.  Saw ID today.  Currently has a PICC line for TPN.    Has dysuria.  No sore throat, cough, vomiting.  Has nausea.    Has been vaccinated and boosted for COVID 19.  Daughter is currently sick with fever.      Past Medical History:  Diagnosis Date   Acute pancreatitis 04/13/2020   Anasarca 10/2019   AVN (avascular necrosis of bone) ([name])    Cataract    Choledocholithiasis (sludge) s/p ERCP 10/2019 10/21/2020   Chronic pain syndrome    CKD (chronic kidney disease), stage III ([place])    Crohn disease ([place])    Crohn's disease of small & large intestine with SGS 1984   Caitlin Peters is a 60 year old female with a history of Crohn's disease diagnosed in 94 (age 62), history of long-term steroid use with osteoporosis, S/P multiple bowel resections ([id number]) complicated by chronic back and abdominal pain, steatorrhea and short bowel syndrome. The patient has been left with ~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 attached to proximal transverse colon through rectum. 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; 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; jejunal to transverse colon anastomosis Treated at [facility] lengthening to 165cm Dr Alene Mires, [place] deficiency     Patient Active Problem List   Diagnosis Date Noted   Chronic idiopathic constipation    Left shoulder pain 05/23/2021   Elevated LFTs    Sepsis ([place]) 05/06/2021   Acidosis 04/08/2021   Colitis 04/07/2021   Intractable nausea and vomiting 03/11/2021   Thoracic discitis 02/10/2021   Pressure injury of skin 12/18/2020   Bacteremia 12/17/2020   Seizures ([place]) 12/17/2020   Drug-seeking behavior 12/16/2020   Seizure ([place]) 12/15/2020   CKD (chronic kidney disease) stage 3, GFR 30-59 ml/min ([place]) 12/06/2020   COVID-19 virus infection 12/06/2020   Protein calorie malnutrition ([place]) 12/06/2020   Palpitations 11/22/2020   PAC (premature atrial contraction) 11/22/2020   History of central line-associated bloodstream infection (CLABSI) 10/21/2020   Nausea & vomiting 10/21/2020   Choledocholithiasis (sludge) s/p ERCP 10/2019 10/21/2020   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   Abdominal pain 47/42/5956   Acute metabolic encephalopathy    Hypomagnesemia    Partial small bowel obstruction ([name])    Bacteremia associated with  intravascular line ([place]) 08/04/2020   Enterobacter sepsis ([place]) 07/22/2020   Fever of unknown origin 06/27/2020   Anxiety 06/03/2020   Acute pancreatitis 04/13/2020   Infection due to Acinetobacter species 03/10/2020   Pancytopenia ([place]) 03/05/2020   Central line infection    Elevated liver enzymes 01/02/2020   Cholangitis    Anasarca 10/28/2019   Acute kidney injury superimposed on chronic kidney disease ([place]) 10/28/2019   Falls 10/28/2019   Malnutrition ([place])    Vitamin B12 deficiency    GERD (gastroesophageal reflux disease)    Chronic pain  syndrome    Hypokalemia due to excessive gastrointestinal loss of potassium 10/13/2019   Fever    Hypokalemia 10/12/2019   Chronic diarrhea 10/12/2019   Dysuria 10/12/2019   Bilateral lower extremity edema 10/12/2019   AKI (acute kidney injury) ([place]) 10/12/2019   Fungemia 08/27/2019   IDA (iron deficiency anemia) 11/03/2018   Dilation of biliary tract 08/28/2018   Severe diarrhea 03/07/2018   LFTs abnormal 01/27/2018   GI tract obstruction ([place]) 01/27/2018   Gram-negative bacteremia 09/13/2017   HTN (hypertension), benign 12/02/2016   Intractable pain 12/02/2016   Anemia 12/02/2016   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   Avascular necrosis ([place]) 06/14/2015   Polyarthralgia 05/03/2015   On total parenteral nutrition (TPN) 12/30/2014   Osteoporosis 12/24/2014   Low back pain 12/16/2014   Vitamin D deficiency 12/16/2014   Short gut syndrome 07/15/2014   History of colonic diverticulitis 2014   Depression 07/24/2012   Gram-positive bacteremia 07/24/2012   Small bowel obstruction due to adhesions ([place]) 07/24/2012   Diarrhea 12/13/2011   Neuralgia and neuritis 06/01/2011   Myalgia and myositis 08/12/2003   Crohn's disease of small & large intestine with SGS 1984    Past Surgical History:  Procedure Laterality Date   ABDOMINAL ADHESION SURGERY  01/22/2018   APPENDECTOMY  1989   BILIARY DILATION  11/26/2019   Procedure: BILIARY DILATION;  Surgeon: Jackquline Denmark, MD;  Location: [facility];  Service: Endoscopy;;   BILIARY DILATION  03/08/2020   Procedure: BILIARY DILATION;  Surgeon: Irving Copas., MD;  Location: [place];  Service: Gastroenterology;;   BIOPSY  03/08/2020   Procedure: BIOPSY;  Surgeon: Irving Copas., MD;  Location[facility];  Service: Gastroenterology;;   CHEST WALL RESECTION     right thoracotomy,resection of chest mass with anterior rib and reconstruction using prosthetic mesh and video arthroscopy   CHOLECYSTECTOMY  01/22/2018    COLONOSCOPY  2019   ENTEROSTOMY CLOSURE  04/1999   ERCP N/A 11/26/2019   Procedure: ENDOSCOPIC RETROGRADE CHOLANGIOPANCREATOGRAPHY (ERCP);  Surgeon: Jackquline Denmark, MD;  Location: [facility];  Service: Endoscopy;  Laterality: N/A;   ERCP N/A 03/08/2020   Procedure: ENDOSCOPIC RETROGRADE CHOLANGIOPANCREATOGRAPHY (ERCP);  Surgeon: Irving Copas., MD;  Location: [place];  Service: Gastroenterology;  Laterality: N/A;   ESOPHAGOGASTRODUODENOSCOPY N/A 03/08/2020   Procedure: ESOPHAGOGASTRODUODENOSCOPY (EGD);  Surgeon: Irving Copas., MD;  Location: [place];  Service: Gastroenterology;  Laterality: N/A;   EUS N/A 03/08/2020   Procedure: UPPER ENDOSCOPIC ULTRASOUND (EUS) LINEAR;  Surgeon: Irving Copas., MD;  Location: [place];  Service: Gastroenterology;  Laterality: N/A;   ILEOCECETOMY  03/1999   ileocolon resection with abdominal stoma   ILEOSTOMY CLOSURE  2001   IR FLUORO GUIDE CV LINE LEFT  01/07/2020   IR FLUORO GUIDE CV LINE LEFT  03/09/2020   IR FLUORO GUIDE CV LINE LEFT  05/09/2020   IR FLUORO GUIDE CV LINE LEFT  07/20/2020  IR FLUORO GUIDE CV LINE RIGHT  08/05/2020   IR FLUORO GUIDE CV LINE RIGHT  04/03/2021   IR PTA VENOUS EXCEPT DIALYSIS CIRCUIT  01/07/2020   IR REMOVAL TUN CV CATH W/O FL  08/05/2020   IR US GUIDE VASC ACCESS LEFT     x 2 06/17/19 and 09/14/2019   IR US GUIDE VASC ACCESS RIGHT  08/05/2020   KNEE SURGERY     right knee    LAPAROSCOPIC SMALL BOWEL RESECTION  2009   2000-2009.  SB resections for Crohns Disease - now with Short gut   OMENTECTOMY  01/22/2018   PARTIAL HYSTERECTOMY  1984   with LSO   REMOVAL OF STONES  11/26/2019   Procedure: REMOVAL OF STONES;  Surgeon: Jackquline Denmark, MD;  Location: [facility];  Service: Endoscopy;;   REMOVAL OF STONES  03/08/2020   Procedure: REMOVAL OF STONES;  Surgeon: Irving Copas., MD;  Location: [facility] ENDOSCOPY;  Service: Gastroenterology;;   SALPINGOOPHORECTOMY Left 1984    SALPINGOOPHORECTOMY Right 1990   SERIAL TRANSVERSE ENTEROPLASTY (STEP) - SMALL BOWEL LENGTHENING  01/22/2018   Dr Alene Mires, [facility] - SB length from 120 to 165cm    SMALL INTESTINE SURGERY  2002   SMALL INTESTINE SURGERY  2003   SPHINCTEROTOMY  11/26/2019   Procedure: SPHINCTEROTOMY;  Surgeon: Jackquline Denmark, MD;  Location: [facility];  Service: Endoscopy;;   TOTAL ABDOMINAL HYSTERECTOMY  1990   with RSO   UPPER GASTROINTESTINAL ENDOSCOPY       OB History   No obstetric history on file.     Family History  Problem Relation Age of Onset   Seizures Mother    Glaucoma Mother    CAD Father    Heart disease Father    Hypertension Father    Breast cancer Sister    Multiple sclerosis Sister    Diabetes Sister    Lupus Sister    Colon cancer Other    Crohn's disease Other     Social History   Tobacco Use   Smoking status: Former   Smokeless tobacco: Never  Scientific laboratory technician Use: Never used  Substance Use Topics   Alcohol use: Not Currently   Drug use: Never    Home Medications Prior to Admission medications   Medication Sig Start Date End Date Taking? Authorizing Provider  acetaminophen (TYLENOL) 325 MG tablet Take 650 mg by mouth every 6 (six) hours as needed for mild pain.     [provider]  amLODipine (NORVASC) 10 MG tablet Take 10 mg by mouth daily.    [provider]  budesonide (ENTOCORT EC) 3 MG 24 hr capsule Take 3 capsules (9 mg total) by mouth daily. 03/06/21   [name], [name], MD  buPROPion [facility] SR) 100 MG 12 hr tablet Take 200 mg by mouth daily. 03/02/21   [provider]  Calcium 200 MG TABS Take 200 mg by mouth daily.    [provider]  carvedilol (COREG) 25 MG tablet Take 1 tablet (25 mg total) by mouth 2 (two) times daily with a meal. 05/16/21 06/15/21  Darliss Cheney, MD  cholecalciferol (VITAMIN D3) 25 MCG (1000 UT) tablet Take 1,000 Units by mouth daily.     [provider]   cycloSPORINE (RESTASIS) 0.05 % ophthalmic emulsion Place 1 drop into both eyes 2 (two) times daily.    [provider]  denosumab (PROLIA) 60 MG/ML SOSY injection Inject 60 mg into the skin every  6 (six) months.    [provider]  dexlansoprazole (DEXILANT) 60 MG capsule Take 1 capsule (60 mg total) by mouth daily. 12/28/20   [name], [name], MD  Dextran 70-Hypromellose 0.1-0.3 % SOLN Place 1 drop into both eyes 4 (four) times daily.    [provider]  diphenoxylate-atropine (LOMOTIL) 2.5-0.025 MG tablet TAKE 1 TABLET BY MOUTH 4 TIMES DAILY AS NEEDED FOR DIARRHEA OR  LOOSE  STOOLS 12/28/20   [name], [name], MD  DULoxetine (CYMBALTA) 30 MG capsule TAKE 3 CAPSULES BY MOUTH ONCE DAILY 03/22/21   [name], [name], MD  estradiol (ESTRACE) 2 MG tablet Take 1 tablet (2 mg total) by mouth daily. 12/07/20   [name], [name], MD  famotidine (PEPCID) 20 MG tablet Take 20 mg by mouth daily as needed for heartburn or indigestion.    [provider]  GATTEX 5 MG KIT Inject 5 mLs into the skin daily. 01/03/21   [provider]  hydrALAZINE (APRESOLINE) 100 MG tablet Take 1 tablet (100 mg total) by mouth 3 (three) times daily. 05/16/21 06/15/21  Darliss Cheney, MD  HYDROmorphone (DILAUDID) 4 MG tablet Take 4 mg by mouth in the morning, at noon, in the evening, and at bedtime. 04/18/21   [provider]  levETIRAcetam (KEPPRA) 500 MG tablet Take 500 mg by mouth 2 (two) times daily.    [provider]  lipase/protease/amylase (CREON) 36000 UNITS CPEP capsule Take 1 capsule (36,000 Units total) by mouth 3 (three) times daily as needed (with meals for digestion). 06/28/20   [name], [name], MD  loperamide (IMODIUM) 2 MG capsule TAKE 1 CAPSULE BY MOUTH AS NEEDED FOR DIARRHEA OR LOOSE STOOLS 06/01/21   [name], [name], MD  Menthol, Topical Analgesic, (ICY HOT BACK EX) Apply 1 application topically daily as  needed (back pain).    [provider]  methadone (DOLOPHINE) 5 MG tablet Take 1 tablet (5 mg total) by mouth 5 (five) times daily. 06/30/20   Florencia Reasons, MD  methocarbamol (ROBAXIN) 500 MG tablet Take 1 tablet (500 mg total) by mouth in the morning and at bedtime. 06/13/21   [name], [name], MD  Multiple Vitamins-Minerals (MULTIVITAMIN ADULT PO) Take 1 tablet by mouth daily.    [provider]  NARCAN 4 MG/0.1ML LIQD nasal spray kit Place 1 spray into the nose once as needed (overdose). 10/14/20   [provider]  PRESCRIPTION MEDICATION Inject 1 each into the vein daily. Home TPN . [facility] . 1 bag for 12 hours. [phone]    [provider]  Probiotic Product (PROBIOTIC-10 PO) Take 1 capsule by mouth daily.     [provider]  promethazine (PHENERGAN) 25 MG tablet TAKE 1 TABLET BY MOUTH EVERY 6 HOURS AS NEEDED FOR NAUSEA FOR VOMITING 06/01/21   [name], [name], MD  sodium chloride 0.9 % infusion Inject 1 mL into the vein daily as needed (flush). 10/16/20   [provider]  sucralfate (CARAFATE) 1 GM/10ML suspension Take 10 mLs (1 g total) by mouth 4 (four) times daily -  with meals and at bedtime. 06/28/20   [name], [name], MD  Trace Minerals Cu-Mn-Se-Zn (TRALEMENT IV) Inject 1 mL into the vein See admin instructions. Used in TPN bag 4 times weekly    [provider]  vitamin B-12 (CYANOCOBALAMIN) 1000 MCG tablet Take 1,000 mcg by mouth daily.  [provider]    Allergies    Meperidine, Hyoscyamine, Cefepime, Gabapentin, Lyrica [pregabalin], Topamax [topiramate], Zosyn [piperacillin sod-tazobactam so], Fentanyl, and Morphine and related  Review of Systems   Review of Systems  All other systems reviewed and are negative.  Physical Exam Updated Vital Signs BP (!) 141/67 (BP Location: Right Arm)   Pulse 92   Temp 98.4 F (36.9 C) (Oral)   Resp 16   Ht 5'  8" (1.727 m)   Wt 59 kg   SpO2 96%   BMI 19.77 kg/m   Physical Exam Vitals and nursing note reviewed.  Constitutional:      Appearance: She is well-developed.  HENT:     Head: Normocephalic and atraumatic.  Cardiovascular:     Rate and Rhythm: Normal rate and regular rhythm.     Heart sounds: Murmur heard.  Pulmonary:     Effort: Pulmonary effort is normal. No respiratory distress.     Breath sounds: Normal breath sounds.  Abdominal:     Palpations: Abdomen is soft.     Tenderness: There is no guarding or rebound.     Comments: Mild generalized abdominal tenderness  Musculoskeletal:        General: No swelling or tenderness.  Skin:    General: Skin is warm and dry.  Neurological:     Mental Status: She is alert and oriented to person, place, and time.  Psychiatric:        Behavior: Behavior normal.    ED Results / Procedures / Treatments   Labs (all labs ordered are listed, but only abnormal results are displayed) Labs Reviewed  COMPREHENSIVE METABOLIC PANEL - Abnormal; Notable for the following components:      Result Value   BUN 45 (*)    Creatinine, Ser 1.49 (*)    Calcium 8.4 (*)    Total Protein 5.7 (*)    Albumin 2.6 (*)    AST 544 (*)    ALT 365 (*)    Alkaline Phosphatase 244 (*)    GFR, Estimated 40 (*)    All other components within normal limits  CBC WITH DIFFERENTIAL/PLATELET - Abnormal; Notable for the following components:   WBC 2.9 (*)    RBC 2.50 (*)    Hemoglobin 7.6 (*)    HCT 23.2 (*)    Platelets 111 (*)    Lymphs Abs 0.3 (*)    All other components within normal limits  URINALYSIS, ROUTINE W REFLEX MICROSCOPIC - Abnormal; Notable for the following components:   Protein, ur 100 (*)    Bacteria, UA RARE (*)    All other components within normal limits  RESP PANEL BY RT-PCR (FLU A&B, COVID) ARPGX2  URINE CULTURE  CULTURE, BLOOD (ROUTINE X 2)  CULTURE, BLOOD (ROUTINE X 2)  LACTIC ACID, PLASMA  PROTIME-INR  APTT  LACTIC ACID, PLASMA   CBC WITH DIFFERENTIAL/PLATELET  LIPASE, BLOOD    EKG EKG Interpretation  Date/Time:  Monday June 19 2021 20:59:35 EDT Ventricular Rate:  103 PR Interval:  171 QRS Duration: 82 QT Interval:  374 QTC Calculation: 490 R Axis:   83 Text Interpretation: Sinus tachycardia Borderline right axis deviation Consider left ventricular hypertrophy Borderline prolonged QT interval Confirmed by Quintella Reichert [phone]) on 06/19/2021 9:32:04 PM  Radiology CT Abdomen Pelvis Wo Contrast  Result Date: 06/19/2021 CLINICAL DATA:  60 year old female with abdominal pain and fever. EXAM: CT ABDOMEN AND PELVIS WITHOUT CONTRAST TECHNIQUE: Multidetector CT imaging of the abdomen and pelvis was  performed following the standard protocol without IV contrast. COMPARISON:  CT abdomen pelvis dated 06/12/2021. FINDINGS: Evaluation of this exam is limited in the absence of intravenous contrast. Evaluation is also limited due to paucity of intra-abdominal fat. Lower chest: Minimal bibasilar dependent atelectasis. The visualized lung bases are otherwise clear. No intra-abdominal free air or free fluid. Hepatobiliary: The liver is unremarkable. There is pneumobilia. Cholecystectomy. Pancreas: The pancreas is grossly unremarkable. Spleen: Normal in size without focal abnormality. Adrenals/Urinary Tract: The adrenal glands are unremarkable. The kidneys, visualized ureters, and urinary bladder appear unremarkable. Stomach/Bowel: Postsurgical changes of bowel with multiple surgical anastomotic sutures. Evaluation of the bowel is limited in the absence of oral contrast and paucity of intra-abdominal fat. No evidence of bowel obstruction. Vascular/Lymphatic: Mild aortoiliac atherosclerotic disease. Right femoral venous line with tip in the intrahepatic IVC. Several small scattered retroperitoneal and mesenteric lymph nodes. Reproductive: Hysterectomy. Other: There is diffuse subcutaneous edema. No fluid collection. Musculoskeletal:  Osteopenia with degenerative changes of the spine. Avascular necrosis of the left femoral head without acute fracture or cortical collapse. No acute osseous pathology. IMPRESSION: 1. No acute intra-abdominal or pelvic pathology. 2. Postsurgical changes of bowel. No evidence of bowel obstruction. 3. Avascular necrosis of the left femoral head without acute fracture or cortical collapse. 4. Aortic Atherosclerosis (ICD10-I70.0). Electronically Signed   By: Anner Crete M.D.   On: 06/19/2021 23:22   DG Chest Port 1 View  Result Date: 06/19/2021 CLINICAL DATA:  Questionable sepsis EXAM: PORTABLE CHEST 1 VIEW COMPARISON:  05/06/2021 FINDINGS: The heart size and mediastinal contours are within normal limits. Both lungs are clear. The visualized skeletal structures are unremarkable. IMPRESSION: No acute abnormality of the lungs in AP portable projection. Electronically Signed   By: Eddie Candle M.D.   On: 06/19/2021 20:56    Procedures Procedures   Medications Ordered in ED Medications  HYDROmorphone (DILAUDID) injection 1 mg (1 mg Intravenous Given 06/19/21 2139)  cefTRIAXone (ROCEPHIN) 1 g in sodium chloride 0.9 % 100 mL IVPB (1 g Intravenous New Bag/Given 06/19/21 2341)  vancomycin (VANCOCIN) IVPB 1000 mg/200 mL premix (has no administration in time range)  cefTRIAXone (ROCEPHIN) 1 g in sodium chloride 0.9 % 100 mL IVPB (has no administration in time range)  lactated ringers bolus 1,000 mL (0 mLs Intravenous Stopped 06/19/21 2206)  ondansetron (ZOFRAN) injection 4 mg (4 mg Intravenous Given 06/19/21 2138)  HYDROmorphone (DILAUDID) injection 1 mg (1 mg Intravenous Given 06/19/21 2326)    ED Course  I have reviewed the triage vital signs and the nursing notes.  Pertinent labs & imaging results that were available during my care of the patient were reviewed by me and considered in my medical decision making (see chart for details).    MDM Rules/Calculators/A&P                          patient  with history of colitis, shortcut on chronic TPN here for evaluation of fever to 102 at home. CBC with pancytopenia when compared to priors.  UA not c/w UTI.  CT without acute abnormality.  CMP with transaminitis.  She was treated with broad spectrum abx for possible sepsis - GI source vs line infection.  Medicine consulted for admission for ongoing treatment.    Final Clinical Impression(s) / ED Diagnoses Final diagnoses:  Febrile illness  Transaminitis    Rx / DC Orders ED Discharge Orders     None  Quintella Reichert, MD 06/19/21 [phone]

## 2021-06-19 NOTE — ED Notes (Signed)
Unable to obtain 2nd set of blood cultures due to no vascular access. Patient has a PICC and 1st set of blood cultures were obtained from PICC. Agricultural consultant notified

## 2021-06-20 ENCOUNTER — Encounter (HOSPITAL_COMMUNITY): Payer: Self-pay | Admitting: Internal Medicine

## 2021-06-20 DIAGNOSIS — Z87891 Personal history of nicotine dependence: Secondary | ICD-10-CM | POA: Diagnosis not present

## 2021-06-20 DIAGNOSIS — E538 Deficiency of other specified B group vitamins: Secondary | ICD-10-CM | POA: Diagnosis present

## 2021-06-20 DIAGNOSIS — K912 Postsurgical malabsorption, not elsewhere classified: Secondary | ICD-10-CM | POA: Diagnosis present

## 2021-06-20 DIAGNOSIS — K5080 Crohn's disease of both small and large intestine without complications: Secondary | ICD-10-CM | POA: Diagnosis present

## 2021-06-20 DIAGNOSIS — K219 Gastro-esophageal reflux disease without esophagitis: Secondary | ICD-10-CM | POA: Diagnosis present

## 2021-06-20 DIAGNOSIS — R7881 Bacteremia: Secondary | ICD-10-CM | POA: Diagnosis not present

## 2021-06-20 DIAGNOSIS — Z20822 Contact with and (suspected) exposure to covid-19: Secondary | ICD-10-CM | POA: Diagnosis present

## 2021-06-20 DIAGNOSIS — N179 Acute kidney failure, unspecified: Secondary | ICD-10-CM | POA: Diagnosis present

## 2021-06-20 DIAGNOSIS — D61818 Other pancytopenia: Secondary | ICD-10-CM | POA: Diagnosis present

## 2021-06-20 DIAGNOSIS — F419 Anxiety disorder, unspecified: Secondary | ICD-10-CM | POA: Diagnosis present

## 2021-06-20 DIAGNOSIS — Z515 Encounter for palliative care: Secondary | ICD-10-CM | POA: Diagnosis not present

## 2021-06-20 DIAGNOSIS — Z789 Other specified health status: Secondary | ICD-10-CM | POA: Diagnosis not present

## 2021-06-20 DIAGNOSIS — R6510 Systemic inflammatory response syndrome (SIRS) of non-infectious origin without acute organ dysfunction: Secondary | ICD-10-CM | POA: Diagnosis present

## 2021-06-20 DIAGNOSIS — K50819 Crohn's disease of both small and large intestine with unspecified complications: Secondary | ICD-10-CM | POA: Diagnosis not present

## 2021-06-20 DIAGNOSIS — K50818 Crohn's disease of both small and large intestine with other complication: Secondary | ICD-10-CM | POA: Diagnosis not present

## 2021-06-20 DIAGNOSIS — R945 Abnormal results of liver function studies: Secondary | ICD-10-CM | POA: Diagnosis not present

## 2021-06-20 DIAGNOSIS — F32A Depression, unspecified: Secondary | ICD-10-CM | POA: Diagnosis present

## 2021-06-20 DIAGNOSIS — G894 Chronic pain syndrome: Secondary | ICD-10-CM | POA: Diagnosis present

## 2021-06-20 DIAGNOSIS — Z8616 Personal history of COVID-19: Secondary | ICD-10-CM | POA: Diagnosis not present

## 2021-06-20 DIAGNOSIS — R7401 Elevation of levels of liver transaminase levels: Secondary | ICD-10-CM | POA: Diagnosis present

## 2021-06-20 DIAGNOSIS — A4159 Other Gram-negative sepsis: Secondary | ICD-10-CM | POA: Diagnosis present

## 2021-06-20 DIAGNOSIS — N1831 Chronic kidney disease, stage 3a: Secondary | ICD-10-CM | POA: Diagnosis present

## 2021-06-20 DIAGNOSIS — Z888 Allergy status to other drugs, medicaments and biological substances status: Secondary | ICD-10-CM | POA: Diagnosis not present

## 2021-06-20 DIAGNOSIS — I10 Essential (primary) hypertension: Secondary | ICD-10-CM | POA: Diagnosis not present

## 2021-06-20 DIAGNOSIS — I129 Hypertensive chronic kidney disease with stage 1 through stage 4 chronic kidney disease, or unspecified chronic kidney disease: Secondary | ICD-10-CM | POA: Diagnosis present

## 2021-06-20 DIAGNOSIS — M25512 Pain in left shoulder: Secondary | ICD-10-CM | POA: Diagnosis present

## 2021-06-20 DIAGNOSIS — E876 Hypokalemia: Secondary | ICD-10-CM | POA: Diagnosis present

## 2021-06-20 DIAGNOSIS — Z7952 Long term (current) use of systemic steroids: Secondary | ICD-10-CM | POA: Diagnosis not present

## 2021-06-20 DIAGNOSIS — Z79899 Other long term (current) drug therapy: Secondary | ICD-10-CM | POA: Diagnosis not present

## 2021-06-20 DIAGNOSIS — R7989 Other specified abnormal findings of blood chemistry: Secondary | ICD-10-CM | POA: Diagnosis present

## 2021-06-20 LAB — COMPREHENSIVE METABOLIC PANEL
ALT: 313 U/L — ABNORMAL HIGH (ref 0–44)
AST: 282 U/L — ABNORMAL HIGH (ref 15–41)
Albumin: 2.5 g/dL — ABNORMAL LOW (ref 3.5–5.0)
Alkaline Phosphatase: 230 U/L — ABNORMAL HIGH (ref 38–126)
Anion gap: 4 — ABNORMAL LOW (ref 5–15)
BUN: 37 mg/dL — ABNORMAL HIGH (ref 6–20)
CO2: 24 mmol/L (ref 22–32)
Calcium: 8.4 mg/dL — ABNORMAL LOW (ref 8.9–10.3)
Chloride: 111 mmol/L (ref 98–111)
Creatinine, Ser: 1.46 mg/dL — ABNORMAL HIGH (ref 0.44–1.00)
GFR, Estimated: 41 mL/min — ABNORMAL LOW (ref >60)
Glucose, Bld: 113 mg/dL — ABNORMAL HIGH (ref 70–99)
Potassium: 3.9 mmol/L (ref 3.5–5.1)
Sodium: 139 mmol/L (ref 135–145)
Total Bilirubin: 1 mg/dL (ref 0.3–1.2)
Total Protein: 5.7 g/dL — ABNORMAL LOW (ref 6.5–8.1)

## 2021-06-20 LAB — CBC
HCT: 23.2 % — ABNORMAL LOW (ref 36.0–46.0)
Hemoglobin: 7.4 g/dL — ABNORMAL LOW (ref 12.0–15.0)
MCH: 29.8 pg (ref 26.0–34.0)
MCHC: 31.9 g/dL (ref 30.0–36.0)
MCV: 93.5 fL (ref 80.0–100.0)
Platelets: 117 10*3/uL — ABNORMAL LOW (ref 150–400)
RBC: 2.48 MIL/uL — ABNORMAL LOW (ref 3.87–5.11)
RDW: 14.9 % (ref 11.5–15.5)
WBC: 4.6 10*3/uL (ref 4.0–10.5)
nRBC: 0 % (ref 0.0–0.2)

## 2021-06-20 LAB — TYPE AND SCREEN
ABO/RH(D): O POS
Antibody Screen: NEGATIVE

## 2021-06-20 LAB — MRSA NEXT GEN BY PCR, NASAL: MRSA by PCR Next Gen: NOT DETECTED

## 2021-06-20 LAB — LACTIC ACID, PLASMA: Lactic Acid, Venous: 0.5 mmol/L (ref 0.5–1.9)

## 2021-06-20 MED ORDER — SODIUM CHLORIDE 0.9 % IV SOLN
2.0000 g | INTRAVENOUS | Status: DC
  Administered 2021-06-21 – 2021-06-22 (×2): 2 g via INTRAVENOUS
  Filled 2021-06-20 (×2): qty 2

## 2021-06-20 MED ORDER — SODIUM CHLORIDE 0.9 % IV BOLUS (SEPSIS)
1000.0000 mL | Freq: Once | INTRAVENOUS | Status: AC
  Administered 2021-06-20: 1000 mL via INTRAVENOUS

## 2021-06-20 MED ORDER — ACETAMINOPHEN 650 MG RE SUPP: 650.0000 mg | Freq: Four times a day (QID) | RECTAL | Status: DC | PRN

## 2021-06-20 MED ORDER — AMLODIPINE BESYLATE 10 MG PO TABS
10.0000 mg | ORAL_TABLET | Freq: Every day | ORAL | Status: DC
  Administered 2021-06-20 – 2021-06-30 (×11): 10 mg via ORAL
  Filled 2021-06-20 (×9): qty 1
  Filled 2021-06-20: qty 2
  Filled 2021-06-20: qty 1

## 2021-06-20 MED ORDER — METHADONE HCL 5 MG PO TABS
5.0000 mg | ORAL_TABLET | Freq: Every day | ORAL | Status: DC
  Administered 2021-06-20 – 2021-06-30 (×53): 5 mg via ORAL
  Filled 2021-06-20 (×53): qty 1

## 2021-06-20 MED ORDER — HYDROMORPHONE HCL 2 MG PO TABS
4.0000 mg | ORAL_TABLET | ORAL | Status: DC | PRN
  Administered 2021-06-24 – 2021-06-27 (×2): 4 mg via ORAL
  Filled 2021-06-20 (×5): qty 2

## 2021-06-20 MED ORDER — PANTOPRAZOLE SODIUM 40 MG PO TBEC
40.0000 mg | DELAYED_RELEASE_TABLET | Freq: Every day | ORAL | Status: DC
  Administered 2021-06-20 – 2021-06-30 (×11): 40 mg via ORAL
  Filled 2021-06-20 (×11): qty 1

## 2021-06-20 MED ORDER — TEDUGLUTIDE (RDNA) 5 MG ~~LOC~~ KIT
1.5000 mg | PACK | Freq: Every day | SUBCUTANEOUS | Status: DC
  Administered 2021-06-21 – 2021-06-30 (×10): 1.5 mg via SUBCUTANEOUS

## 2021-06-20 MED ORDER — ACETAMINOPHEN 325 MG PO TABS: 650.0000 mg | ORAL_TABLET | Freq: Four times a day (QID) | ORAL | Status: DC | PRN

## 2021-06-20 MED ORDER — BISACODYL 5 MG PO TBEC: 5.0000 mg | DELAYED_RELEASE_TABLET | Freq: Every day | ORAL | Status: DC | PRN

## 2021-06-20 MED ORDER — METRONIDAZOLE 500 MG/100ML IV SOLN
500.0000 mg | Freq: Three times a day (TID) | INTRAVENOUS | Status: DC
  Administered 2021-06-20 – 2021-06-22 (×7): 500 mg via INTRAVENOUS
  Filled 2021-06-20 (×7): qty 100

## 2021-06-20 MED ORDER — VITAMIN B-12 1000 MCG PO TABS
1000.0000 ug | ORAL_TABLET | Freq: Every day | ORAL | Status: DC
  Administered 2021-06-20 – 2021-06-30 (×11): 1000 ug via ORAL
  Filled 2021-06-20 (×12): qty 1

## 2021-06-20 MED ORDER — MUSCLE RUB 10-15 % EX CREA
TOPICAL_CREAM | CUTANEOUS | Status: DC | PRN
  Administered 2021-06-20 – 2021-06-25 (×3): 1 via TOPICAL
  Filled 2021-06-20: qty 85

## 2021-06-20 MED ORDER — BUDESONIDE 3 MG PO CPEP
9.0000 mg | ORAL_CAPSULE | Freq: Every day | ORAL | Status: DC
  Administered 2021-06-20 – 2021-06-30 (×11): 9 mg via ORAL
  Filled 2021-06-20 (×11): qty 3

## 2021-06-20 MED ORDER — ESTRADIOL 1 MG PO TABS
2.0000 mg | ORAL_TABLET | Freq: Every day | ORAL | Status: DC
  Administered 2021-06-20 – 2021-06-30 (×11): 2 mg via ORAL
  Filled 2021-06-20 (×11): qty 2

## 2021-06-20 MED ORDER — ONDANSETRON HCL 4 MG/2ML IJ SOLN
4.0000 mg | Freq: Four times a day (QID) | INTRAMUSCULAR | Status: DC | PRN
  Administered 2021-06-20 – 2021-06-29 (×11): 4 mg via INTRAVENOUS
  Filled 2021-06-20 (×11): qty 2

## 2021-06-20 MED ORDER — VITAMIN D 25 MCG (1000 UNIT) PO TABS
1000.0000 [IU] | ORAL_TABLET | Freq: Every day | ORAL | Status: DC
  Administered 2021-06-21 – 2021-06-30 (×10): 1000 [IU] via ORAL
  Filled 2021-06-20 (×11): qty 1

## 2021-06-20 MED ORDER — SENNOSIDES-DOCUSATE SODIUM 8.6-50 MG PO TABS: 1.0000 | ORAL_TABLET | Freq: Every evening | ORAL | Status: DC | PRN

## 2021-06-20 MED ORDER — PROBIOTIC-10 PO CHEW: CHEWABLE_TABLET | Freq: Every day | ORAL | Status: DC

## 2021-06-20 MED ORDER — SODIUM CHLORIDE 0.9 % IV SOLN: 1.0000 mL | Freq: Every day | INTRAVENOUS | Status: DC | PRN

## 2021-06-20 MED ORDER — MENTHOL (TOPICAL ANALGESIC) 5 % EX PTCH: MEDICATED_PATCH | Freq: Every day | CUTANEOUS | Status: DC | PRN

## 2021-06-20 MED ORDER — TRACE MINERALS CU-MN-SE-ZN 300-55-60-3000 MCG/ML IV SOLN: 1.0000 mL | INTRAVENOUS | Status: DC

## 2021-06-20 MED ORDER — FAMOTIDINE 20 MG PO TABS: 20.0000 mg | ORAL_TABLET | Freq: Every day | ORAL | Status: DC | PRN

## 2021-06-20 MED ORDER — CYCLOSPORINE 0.05 % OP EMUL
1.0000 [drp] | Freq: Two times a day (BID) | OPHTHALMIC | Status: DC
  Administered 2021-06-20 – 2021-06-30 (×21): 1 [drp] via OPHTHALMIC
  Filled 2021-06-20 (×22): qty 1

## 2021-06-20 MED ORDER — RISAQUAD PO CAPS
1.0000 | ORAL_CAPSULE | Freq: Every day | ORAL | Status: DC
  Administered 2021-06-21 – 2021-06-30 (×10): 1 via ORAL
  Filled 2021-06-20 (×12): qty 1

## 2021-06-20 MED ORDER — BUPROPION HCL ER (SR) 100 MG PO TB12
200.0000 mg | ORAL_TABLET | Freq: Every day | ORAL | Status: DC
  Administered 2021-06-20 – 2021-06-30 (×11): 200 mg via ORAL
  Filled 2021-06-20 (×11): qty 2

## 2021-06-20 MED ORDER — ADULT MULTIVITAMIN W/MINERALS CH
ORAL_TABLET | Freq: Every day | ORAL | Status: DC
  Administered 2021-06-24 – 2021-06-29 (×3): 1 via ORAL
  Filled 2021-06-20 (×11): qty 1

## 2021-06-20 MED ORDER — ONDANSETRON HCL 4 MG PO TABS: 4.0000 mg | ORAL_TABLET | Freq: Four times a day (QID) | ORAL | Status: DC | PRN

## 2021-06-20 MED ORDER — POLYVINYL ALCOHOL 1.4 % OP SOLN
1.0000 [drp] | Freq: Four times a day (QID) | OPHTHALMIC | Status: DC
  Administered 2021-06-20 – 2021-06-30 (×37): 1 [drp] via OPHTHALMIC
  Filled 2021-06-20 (×3): qty 15

## 2021-06-20 MED ORDER — SODIUM CHLORIDE 0.9% FLUSH
10.0000 mL | Freq: Two times a day (BID) | INTRAVENOUS | Status: DC
Start: 2021-06-21 — End: 2021-06-30
  Administered 2021-06-22 – 2021-06-29 (×4): 10 mL

## 2021-06-20 MED ORDER — CALCIUM CARBONATE ANTACID 500 MG PO CHEW
500.0000 mg | CHEWABLE_TABLET | Freq: Every day | ORAL | Status: DC
  Administered 2021-06-20 – 2021-06-29 (×10): 500 mg via ORAL
  Filled 2021-06-20 (×10): qty 3

## 2021-06-20 MED ORDER — CHLORHEXIDINE GLUCONATE CLOTH 2 % EX PADS
6.0000 | MEDICATED_PAD | Freq: Every day | CUTANEOUS | Status: DC
  Administered 2021-06-20 – 2021-06-30 (×11): 6 via TOPICAL

## 2021-06-20 MED ORDER — CARVEDILOL 25 MG PO TABS
25.0000 mg | ORAL_TABLET | Freq: Two times a day (BID) | ORAL | Status: DC
  Administered 2021-06-20 – 2021-06-30 (×22): 25 mg via ORAL
  Filled 2021-06-20 (×21): qty 1
  Filled 2021-06-20: qty 2

## 2021-06-20 MED ORDER — HYDRALAZINE HCL 50 MG PO TABS
100.0000 mg | ORAL_TABLET | Freq: Three times a day (TID) | ORAL | Status: DC
  Administered 2021-06-20 – 2021-06-30 (×33): 100 mg via ORAL
  Filled 2021-06-20 (×35): qty 2

## 2021-06-20 MED ORDER — PANCRELIPASE (LIP-PROT-AMYL) 12000-38000 UNITS PO CPEP
36000.0000 [IU] | ORAL_CAPSULE | Freq: Three times a day (TID) | ORAL | Status: DC
  Administered 2021-06-21 – 2021-06-30 (×28): 36000 [IU] via ORAL
  Filled 2021-06-20 (×3): qty 3
  Filled 2021-06-20: qty 1
  Filled 2021-06-20 (×13): qty 3
  Filled 2021-06-20: qty 1
  Filled 2021-06-20 (×15): qty 3

## 2021-06-20 MED ORDER — ENOXAPARIN SODIUM 40 MG/0.4ML IJ SOSY
40.0000 mg | PREFILLED_SYRINGE | INTRAMUSCULAR | Status: DC
  Administered 2021-06-21 – 2021-06-30 (×9): 40 mg via SUBCUTANEOUS
  Filled 2021-06-20 (×12): qty 0.4

## 2021-06-20 MED ORDER — METHOCARBAMOL 500 MG PO TABS
500.0000 mg | ORAL_TABLET | Freq: Two times a day (BID) | ORAL | Status: DC
  Administered 2021-06-20 – 2021-06-30 (×21): 500 mg via ORAL
  Filled 2021-06-20 (×21): qty 1

## 2021-06-20 MED ORDER — PROCHLORPERAZINE EDISYLATE 10 MG/2ML IJ SOLN
10.0000 mg | Freq: Four times a day (QID) | INTRAMUSCULAR | Status: DC | PRN
  Administered 2021-06-20 – 2021-06-30 (×15): 10 mg via INTRAVENOUS
  Filled 2021-06-20 (×15): qty 2

## 2021-06-20 MED ORDER — DULOXETINE HCL 60 MG PO CPEP
90.0000 mg | ORAL_CAPSULE | Freq: Every day | ORAL | Status: DC
  Administered 2021-06-20 – 2021-06-26 (×7): 90 mg via ORAL
  Filled 2021-06-20: qty 1
  Filled 2021-06-20: qty 3
  Filled 2021-06-20 (×6): qty 1

## 2021-06-20 MED ORDER — LEVETIRACETAM 500 MG PO TABS
500.0000 mg | ORAL_TABLET | Freq: Two times a day (BID) | ORAL | Status: DC
  Administered 2021-06-20 – 2021-06-30 (×21): 500 mg via ORAL
  Filled 2021-06-20 (×21): qty 1

## 2021-06-20 MED ORDER — SUCRALFATE 1 GM/10ML PO SUSP
1.0000 g | Freq: Three times a day (TID) | ORAL | Status: DC
  Administered 2021-06-20 – 2021-06-29 (×40): 1 g via ORAL
  Filled 2021-06-20 (×39): qty 10

## 2021-06-20 MED ORDER — VANCOMYCIN HCL 750 MG/150ML IV SOLN
750.0000 mg | INTRAVENOUS | Status: DC
  Administered 2021-06-20 – 2021-06-22 (×2): 750 mg via INTRAVENOUS
  Filled 2021-06-20 (×2): qty 150

## 2021-06-20 MED ORDER — SODIUM CHLORIDE 0.9% FLUSH
10.0000 mL | INTRAVENOUS | Status: DC | PRN
  Administered 2021-06-21 – 2021-06-30 (×3): 10 mL

## 2021-06-20 MED ORDER — PANCRELIPASE (LIP-PROT-AMYL) 36000-114000 UNITS PO CPEP
36000.0000 [IU] | ORAL_CAPSULE | Freq: Three times a day (TID) | ORAL | Status: DC
  Administered 2021-06-20: 36000 [IU] via ORAL
  Filled 2021-06-20 (×2): qty 1

## 2021-06-20 MED ORDER — SODIUM CHLORIDE 0.9 % IV SOLN: INTRAVENOUS | Status: DC

## 2021-06-20 NOTE — Progress Notes (Signed)
TOC was not consulted on pt, however Pt's [employer] agency call CSW to informed hospital that pt is active with [facility], spoke with Hoyle Sauer [phone]).   Arlie Solomons.Crispin Vogel, MSW, [place]  Transitions of Care Clinical Social Worker I Direct Dial: [phone]  Fax: [phone] [email or web address]

## 2021-06-20 NOTE — Progress Notes (Signed)
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  Assessment: 60 year old female on chronic TPN followed by [facility] for short gut syndrome admitted for sepsis from unknown source (possible bacteremia).  Patient is on cyclic TPN over 12 hours at home.  Of note, previous admission in June 2022- TPN was held for a short period of time due to elevated liver enzymes.   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, Crohn's s/p multiple SBR's, short gut syndrome, chronic diarrhea, anasarca, h/o diverticulosis, B12 deficiency, GERD  Glucose / Insulin: 92-113 on BMET Electrolytes: lytes wnl Renal: Scr elevated at 1.49 but downtrending, BUN elevated at 37 also downtrending Hepatic: Alk phos 230, AST 282/ALT 313, 6/29 labs: prealbumin 26.7, TG 104 Intake / Output; MIVF: NS @ 100/hr; received LR 1L bolus; no measured UOP GI Imaging: GI Surgeries / Procedures: n/a Ordered GI Medications: Creon 36,000 units TID w/ meals, Carafate 1g TID w/ meals and QHS, famotidine PRN, Risaquad QD - awaiting med rec for other medications  Central access: PICC line placed 04/03/21 TPN start date: at home  Nutritional Goals - awaiting RD recommendations  Current Nutrition:  NPO  Plan:  TPN on hold for now given ?PICC line infection and elevated LFTs F/u outpatient TPN RX from [facility] (awaiting phone call)  Dimple Nanas, PharmD 06/20/2021,7:46 AM

## 2021-06-20 NOTE — Progress Notes (Signed)
Code Sepsis tracking by [facility]

## 2021-06-20 NOTE — Progress Notes (Signed)
Patient admitted early this morning.  H&P reviewed.  Patient seen and examined.  Patient's main complaint is of left shoulder pain which has been ongoing for 1 to 2 weeks.  She has had MRI.  Has seen her primary care provider.  She has been referred to orthopedics and her appointment is on August 1.  Presented due to development of fever.  Has been having some nausea.  Patient with a longstanding history of Crohn's disease with short gut syndrome and is on TPN at home.  Followed by gastroenterology at [facility].  Also has a history of PICC line infections previously.  Followed by Dr. Baxter Flattery with ID.  Vital signs noted to be stable this morning.  T-max 100.8 last night.  Other vital signs are stable.  Restricted range of motion of the left shoulder S1-S2 is normal regular. Lungs are clear to auscultation bilaterally Abdomen is soft.  Diffusely tender without any rebound rigidity or guarding.  Bowel sounds present normal. No obvious erythema noted around the PICC site in the right thigh.  Labs from this morning reviewed.  Continues to have transaminitis which is slightly better than yesterday.  Noted to be anemic but stable.  Renal function is stable.  Continue antibiotics with ceftriaxone, metronidazole and vancomycin.  Follow-up on cultures.  TPN is currently on hold till we have culture data back.  Continue IV fluids.  Supposedly has an appointment with orthopedics in August 1 for her left shoulder.  Recently done MRI towards the end of June did not show any acute findings.  Other issues as per H&P.  We will continue to monitor closely.  Caitlin Peters 06/20/2021

## 2021-06-20 NOTE — H&P (Signed)
History and Physical    ZETTA STONEMAN HXT:[id number] DOB: [birth year] DOA: 06/19/2021  PCP: [name], [name], MD  Patient coming from: Home  I have personally briefly reviewed patient's old medical records in [facility].  Chief Complaint: fever and chills  HPI: Caitlin Peters is a 60 y.o. female with medical history significant for short gut syndrome requiring TPN, Crohn's disease with multiple surgeries, chronic pain, chronic PICC line with poor IV access, who presents to the emergency department on 06/19/21 with fever and chills. She reports she has not felt well since 05/29/21; she was in the hospital in late June 2022. Since leaving the hospital she has noticed pain in her left shoulder/arm. The fever and chills began the day before admission and have been intermittent. She reports diaphoresis at night. Fever at home was 102 degrees F. She has chronic abdominal pain, located in the upper abdomen and left side, radiates to back, is up to 7-8/10 at times and is characterized as cramping, sharp, and dull at times. Abdominal pain is alleviated by nothing and exacerbated by eating. Fever and chills are alleviated by nothing and exacerbated by nothing. Associated symptoms: Nausea. Vomiting intermittently over the last month. Chronic diarrhea currently more like jello. No bloody stool. Dysuria but no hematuria. No redness or pain around her PICC line site. No shortness of breath, cough, wheezing, chest pain, palpitations. Chronic back pain that is unchanged.   ED Course: Vitals include temperature 100.8 F, pulse 104.  Labs include WBC is 2.9, hemoglobin 7.6, platelets 111, MCV 92.8, lactic acid 0.6, lipase 38, AST 544, ALT 365, total protein 5.7, total bilirubin 1, INR 1, PTT 30.  SARS-CoV-2 PCR is negative.  Urinalysis shows no leukocytes or nitrite.  Cultures were drawn.  Pharmacy was consulted for antibiotic due to patient's multiple allergies; patient was given IV vancomycin and IV  ceftriaxone 2 g x 1.   History:  Admission 05/22/21 through 05/25/21 for left shoulder pain; she was referred to the ED from the ID clinic. Also had diarrhea, vomiting, and abdominal pain. Had acute kidney injury. MRI left shoulder revealed only mild tendinopathy. CT abdomen/pelvis showed resolution of colitis and large fecal matter/gas. C diff was negative.  She last had her PICC line replaced in May 2022. She has weekly dressing changes and changed the dressing herself on 06/17/21.   Review of Systems: As per HPI otherwise all other systems reviewed and are unremarable. NEUROLOGICAL: No headache or focal weakness.  INTEGUMENT: no rashes, itching, or new lesions.    Past Medical History:  Diagnosis Date   Acute pancreatitis 04/13/2020   Anasarca 10/2019   AVN (avascular necrosis of bone) ([name])    Cataract    Choledocholithiasis (sludge) s/p ERCP 10/2019 10/21/2020   Chronic pain syndrome    CKD (chronic kidney disease), stage III ([place])    Crohn disease ([place])    Crohn's disease of small & large intestine with SGS 1984   Caitlin Peters is a 60 year old female with a history of Crohn's disease diagnosed in 70 (age 25), history of long-term steroid use with osteoporosis, S/P multiple bowel resections ([id number]) complicated by chronic back and abdominal pain, steatorrhea and short bowel syndrome. The patient has been left with ~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 attached to proximal transverse colon through rectum. She has been   Depression    Diverticulosis    GERD (gastroesophageal reflux disease)    HTN (hypertension)    IDA (iron deficiency anemia)  Malnutrition ([place])    Mass in chest    Osteoporosis    Osteoporosis 12/24/2014   Pancreatitis    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; 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; jejunal to transverse colon anastomosis Treated at [place] SB lengthening to 165cm Dr Alene Mires,  [facility]   Vitamin B12 deficiency     Past Surgical History:  Procedure Laterality Date   ABDOMINAL ADHESION SURGERY  01/22/2018   [place]  11/26/2019   Procedure: BILIARY DILATION;  Surgeon: Jackquline Denmark, MD;  Location: [facility];  Service: Endoscopy;;   BILIARY DILATION  03/08/2020   Procedure: BILIARY DILATION;  Surgeon: Irving Copas., MD;  Location: [place];  Service: Gastroenterology;;   BIOPSY  03/08/2020   Procedure: BIOPSY;  Surgeon: Irving Copas., MD;  Location: [place];  Service: Gastroenterology;;   CHEST WALL RESECTION     right thoracotomy,resection of chest mass with anterior rib and reconstruction using prosthetic mesh and video arthroscopy   CHOLECYSTECTOMY  01/22/2018   COLONOSCOPY  2019   ENTEROSTOMY CLOSURE  04/1999   ERCP N/A 11/26/2019   Procedure: ENDOSCOPIC RETROGRADE CHOLANGIOPANCREATOGRAPHY (ERCP);  Surgeon: Jackquline Denmark, MD;  Location: [facility];  Service: Endoscopy;  Laterality: N/A;   ERCP N/A 03/08/2020   Procedure: ENDOSCOPIC RETROGRADE CHOLANGIOPANCREATOGRAPHY (ERCP);  Surgeon: Irving Copas., MD;  Location: [place];  Service: Gastroenterology;  Laterality: N/A;   ESOPHAGOGASTRODUODENOSCOPY N/A 03/08/2020   Procedure: ESOPHAGOGASTRODUODENOSCOPY (EGD);  Surgeon: Irving Copas., MD;  Location: [place];  Service: Gastroenterology;  Laterality: N/A;   EUS N/A 03/08/2020   Procedure: UPPER ENDOSCOPIC ULTRASOUND (EUS) LINEAR;  Surgeon: Irving Copas., MD;  Location: [place];  Service: Gastroenterology;  Laterality: N/A;   ILEOCECETOMY  03/1999   ileocolon resection with abdominal stoma   ILEOSTOMY CLOSURE  2001   IR FLUORO GUIDE CV LINE LEFT  01/07/2020   IR FLUORO GUIDE CV LINE LEFT  03/09/2020   IR FLUORO GUIDE CV LINE LEFT  05/09/2020   IR FLUORO GUIDE CV LINE LEFT  07/20/2020   IR FLUORO GUIDE CV LINE RIGHT  08/05/2020   IR  FLUORO GUIDE CV LINE RIGHT  04/03/2021   IR PTA VENOUS EXCEPT DIALYSIS CIRCUIT  01/07/2020   IR REMOVAL TUN CV CATH W/O FL  08/05/2020   IR US GUIDE VASC ACCESS LEFT     x 2 06/17/19 and 09/14/2019   IR US GUIDE VASC ACCESS RIGHT  08/05/2020   KNEE SURGERY     right knee    LAPAROSCOPIC SMALL BOWEL RESECTION  2009   2000-2009.  SB resections for Crohns Disease - now with Short gut   OMENTECTOMY  01/22/2018   PARTIAL HYSTERECTOMY  1984   with LSO   REMOVAL OF STONES  11/26/2019   Procedure: REMOVAL OF STONES;  Surgeon: Jackquline Denmark, MD;  Location: [facility];  Service: Endoscopy;;   REMOVAL OF STONES  03/08/2020   Procedure: REMOVAL OF STONES;  Surgeon: Irving Copas., MD;  Location: [facility];  Service: Gastroenterology;;   SALPINGOOPHORECTOMY Left 1984   SALPINGOOPHORECTOMY Right 1990   SERIAL TRANSVERSE ENTEROPLASTY (STEP) - SMALL BOWEL LENGTHENING  01/22/2018   Dr Alene Mires, [facility] - SB length from 120 to 165cm    [place]  2003   SPHINCTEROTOMY  11/26/2019   Procedure: SPHINCTEROTOMY;  Surgeon: Jackquline Denmark, MD;  Location: [facility];  Service: Endoscopy;;   TOTAL ABDOMINAL HYSTERECTOMY  1990   with RSO   UPPER GASTROINTESTINAL ENDOSCOPY      Social History  reports that she has quit smoking. She has never used smokeless tobacco. She reports previous alcohol use. She reports that she does not use drugs.  Allergies  Allergen Reactions   Meperidine Hives    Other reaction(s): GI Upset Due to [place]    Hyoscyamine Hives and Swelling    Legs swelling   Disorientation   Cefepime Other (See Comments)    Neurotoxicity occurring in setting of AKI. Ceftriaxone tolerated during same admit   Gabapentin Other (See Comments)    unknown   Lyrica [Pregabalin] Other (See Comments)    unknown   Topamax [Topiramate] Other (See Comments)    unknown   Zosyn [Piperacillin Sod-Tazobactam So]     Patient reports it  makes her vomit, her neck stiff, and her "heart feel funny"   Fentanyl Rash    Pt is allergic to fentanyl patch related to the glue (gives her a rash) Pt states she is NOT allergic to fentanyl IV medicine   Morphine And Related Rash    Family History  Problem Relation Age of Onset   Seizures Mother    Glaucoma Mother    CAD Father    Heart disease Father    Hypertension Father    Breast cancer Sister    Multiple sclerosis Sister    Diabetes Sister    Lupus Sister    Colon cancer Other    Crohn's disease Other      Home Medications  Prior to Admission medications   Medication Sig Start Date End Date Taking? Authorizing Provider  acetaminophen (TYLENOL) 325 MG tablet Take 650 mg by mouth every 6 (six) hours as needed for mild pain.     [provider]  amLODipine (NORVASC) 10 MG tablet Take 10 mg by mouth daily.    [provider]  budesonide (ENTOCORT EC) 3 MG 24 hr capsule Take 3 capsules (9 mg total) by mouth daily. 03/06/21   [name], [name], MD  buPROPion [facility] SR) 100 MG 12 hr tablet Take 200 mg by mouth daily. 03/02/21   [provider]  Calcium 200 MG TABS Take 200 mg by mouth daily.    [provider]  carvedilol (COREG) 25 MG tablet Take 1 tablet (25 mg total) by mouth 2 (two) times daily with a meal. 05/16/21 06/15/21  Darliss Cheney, MD  cholecalciferol (VITAMIN D3) 25 MCG (1000 UT) tablet Take 1,000 Units by mouth daily.     [provider]  cycloSPORINE (RESTASIS) 0.05 % ophthalmic emulsion Place 1 drop into both eyes 2 (two) times daily.    [provider]  denosumab (PROLIA) 60 MG/ML SOSY injection Inject 60 mg into the skin every 6 (six) months.    [provider]  dexlansoprazole (DEXILANT) 60 MG capsule Take 1 capsule (60 mg total) by mouth daily. 12/28/20   [name], [name], MD  Dextran 70-Hypromellose 0.1-0.3 % SOLN Place 1 drop into both eyes 4 (four) times daily.    [provider]  diphenoxylate-atropine (LOMOTIL) 2.5-0.025 MG tablet TAKE 1 TABLET BY MOUTH 4 TIMES DAILY AS NEEDED FOR DIARRHEA OR  LOOSE  STOOLS 12/28/20   [name], [name], MD  DULoxetine (CYMBALTA) 30 MG capsule TAKE 3 CAPSULES BY MOUTH ONCE  DAILY 03/22/21   [name], [name], MD  estradiol (ESTRACE) 2 MG tablet Take 1 tablet (2 mg total) by mouth daily. 12/07/20   [name], [name], MD  famotidine (PEPCID) 20 MG tablet Take 20 mg by mouth daily as needed for heartburn or indigestion.    [provider]  GATTEX 5 MG KIT Inject 5 mLs into the skin daily. 01/03/21   [provider]  hydrALAZINE (APRESOLINE) 100 MG tablet Take 1 tablet (100 mg total) by mouth 3 (three) times daily. 05/16/21 06/15/21  Darliss Cheney, MD  HYDROmorphone (DILAUDID) 4 MG tablet Take 4 mg by mouth in the morning, at noon, in the evening, and at bedtime. 04/18/21   [provider]  levETIRAcetam (KEPPRA) 500 MG tablet Take 500 mg by mouth 2 (two) times daily.    [provider]  lipase/protease/amylase (CREON) 36000 UNITS CPEP capsule Take 1 capsule (36,000 Units total) by mouth 3 (three) times daily as needed (with meals for digestion). 06/28/20   [name], [name], MD  loperamide (IMODIUM) 2 MG capsule TAKE 1 CAPSULE BY MOUTH AS NEEDED FOR DIARRHEA OR LOOSE STOOLS 06/01/21   [name], [name], MD  Menthol, Topical Analgesic, (ICY HOT BACK EX) Apply 1 application topically daily as needed (back pain).    [provider]  methadone (DOLOPHINE) 5 MG tablet Take 1 tablet (5 mg total) by mouth 5 (five) times daily. 06/30/20   Florencia Reasons, MD  methocarbamol (ROBAXIN) 500 MG tablet Take 1 tablet (500 mg total) by mouth in the morning and at bedtime. 06/13/21   [name], [name], MD  Multiple Vitamins-Minerals (MULTIVITAMIN ADULT PO) Take 1 tablet by mouth daily.    [provider]  NARCAN 4 MG/0.1ML LIQD nasal spray kit Place 1 spray into  the nose once as needed (overdose). 10/14/20   [provider]  PRESCRIPTION MEDICATION Inject 1 each into the vein daily. Home TPN . [facility]  . 1 bag for 12 hours. [phone]    [provider]  Probiotic Product (PROBIOTIC-10 PO) Take 1 capsule by mouth daily.     [provider]  promethazine (PHENERGAN) 25 MG tablet TAKE 1 TABLET BY MOUTH EVERY 6 HOURS AS NEEDED FOR NAUSEA FOR VOMITING 06/01/21   [name], [name], MD  sodium chloride 0.9 % infusion Inject 1 mL into the vein daily as needed (flush). 10/16/20   [provider]  sucralfate (CARAFATE) 1 GM/10ML suspension Take 10 mLs (1 g total) by mouth 4 (four) times daily -  with meals and at bedtime. 06/28/20   [name], [name], MD  Trace Minerals Cu-Mn-Se-Zn (TRALEMENT IV) Inject 1 mL into the vein See admin instructions. Used in TPN bag 4 times weekly    [provider]  vitamin B-12 (CYANOCOBALAMIN) 1000 MCG tablet Take 1,000 mcg by mouth daily.     [provider]    Physical Exam: Vitals:   06/19/21 2200 06/19/21 2230 06/19/21 2317 06/19/21 2337  BP: 121/66 129/71  (!) 141/67  Pulse: (!) 102 (!) 101 94 92  Resp: (!) 22 (!) _0 Temp:    98.4 F (36.9 C)  TempSrc:    Oral  SpO2: 95% 96% 98% 96%  Weight:      Height:        Constitutional: NAD, calm, comfortable, thin, ill-appearing. Vitals:   06/19/21 2200 06/19/21 2230 06/19/21 2317 06/19/21 2337  BP:  121/66 129/71  (!) 141/67  Pulse: (!) 102 (!) 101 94 92  Resp: (!) 22 (!) _0 Temp:    98.4 F (36.9 C)  TempSrc:    Oral  SpO2: 95% 96% 98% 96%  Weight:      Height:       Eyes: Pupils equal and round, lids and conjunctivae without icterus or erythema. ENMT: Mucous membranes are dry. Posterior pharynx clear of any exudate or lesions. Nares patent without discharge or bleeding.  Normocephalic, atraumatic.  Normal dentition.  Neck: normal, supple, no masses,  trachea midline.  Thyroid nontender, no masses appreciated, no thyromegaly. Respiratory: clear to auscultation bilaterally. Chest wall movements are symmetric. No wheezing, no crackles.  No rhonchi.  Normal respiratory effort. No accessory muscle use.  Cardiovascular: Regular rate and rhythm, no murmurs / rubs / gallops. Pulses: Radial, brachial, PT, and DP pulses 2+ bilaterally. No carotid bruits.  Capillary refill less than 3 seconds in upper and lower extremities. Edema: None bilaterally. GI: soft, non-distended, normal active bowel sounds. No hepatosplenomegaly. No rigidity, rebound, or guarding. No masses palpated.  Generalized tenderness. Musculoskeletal: no clubbing / cyanosis. No joint deformity upper and lower extremities. Good ROM, no contractures. Normal muscle tone.  No deformity in the back bilaterally. Lower back tenderness bilaterally. Integument: no rashes, lesions, ulcers. No induration. Clean, dry, intact. Right femoral PICC line site: No erythema, exudate, or tenderness. Neurologic: CN 2-12 grossly intact. Sensation grossly intact to light touch. DTR 2+ bilaterally.  Babinski: Toes downgoing bilaterally.  Strength 5/5 in all 4.  Intact rapid alternating movements bilaterally.  No pronator drift. Psychiatric: Normal judgment and insight. Alert and oriented x 3. Normal mood.  Normal and appropriate affect. Lymphatic: No cervical lymphadenopathy. No supraclavicular lymphadenopathy.   Labs on Admission: I have personally reviewed the following labs and imaging studies.  CBC: Recent Labs  Lab 06/15/21 0813 06/19/21 2145 06/19/21 2330  WBC 6.7 2.9* 3.8*  NEUTROABS 4.4 2.4 3.2  HGB 10.9* 7.6* 7.2*  HCT 32.4* 23.2* 21.8*  MCV 91.0 92.8 92.4  PLT 243 111* 110*    Basic Metabolic Panel: Recent Labs  Lab 06/15/21 0813 06/19/21 2145  NA 141 136  K 3.6 3.6  CL 108 105  CO2 20* 22  GLUCOSE 109* 92  BUN 28* 45*  CREATININE 1.39* 1.49*  CALCIUM 9.3 8.4*    GFR: Estimated  Creatinine Clearance: 37.4 mL/min (A) (by C-G formula based on SCr of 1.49 mg/dL (H)).  Liver Function Tests: Recent Labs  Lab 06/15/21 0813 06/19/21 2145  AST 17 544*  ALT 23 365*  ALKPHOS 112 244*  BILITOT 0.7 1.0  PROT 7.6 5.7*  ALBUMIN 3.3* 2.6*    Urine analysis:    Component Value Date/Time   COLORURINE YELLOW 06/19/2021 2145   APPEARANCEUR CLEAR 06/19/2021 2145   LABSPEC 1.015 06/19/2021 2145   PHURINE 6.0 06/19/2021 2145   GLUCOSEU NEGATIVE 06/19/2021 2145   HGBUR NEGATIVE 06/19/2021 2145   BILIRUBINUR NEGATIVE 06/19/2021 2145   [place] NEGATIVE 06/19/2021 2145   PROTEINUR 100 (A) 06/19/2021 2145   NITRITE NEGATIVE 06/19/2021 2145   LEUKOCYTESUR NEGATIVE 06/19/2021 2145    Radiological Exams on Admission: CT Abdomen Pelvis Wo Contrast  Result Date: 06/19/2021 CLINICAL DATA:  60 year old female with abdominal pain and fever. EXAM: CT ABDOMEN AND PELVIS WITHOUT CONTRAST TECHNIQUE: Multidetector CT imaging of the abdomen and pelvis was performed following the standard protocol without IV contrast. COMPARISON:  CT abdomen pelvis dated 06/12/2021. FINDINGS:  Evaluation of this exam is limited in the absence of intravenous contrast. Evaluation is also limited due to paucity of intra-abdominal fat. Lower chest: Minimal bibasilar dependent atelectasis. The visualized lung bases are otherwise clear. No intra-abdominal free air or free fluid. Hepatobiliary: The liver is unremarkable. There is pneumobilia. Cholecystectomy. Pancreas: The pancreas is grossly unremarkable. Spleen: Normal in size without focal abnormality. Adrenals/Urinary Tract: The adrenal glands are unremarkable. The kidneys, visualized ureters, and urinary bladder appear unremarkable. Stomach/Bowel: Postsurgical changes of bowel with multiple surgical anastomotic sutures. Evaluation of the bowel is limited in the absence of oral contrast and paucity of intra-abdominal fat. No evidence of bowel obstruction.  Vascular/Lymphatic: Mild aortoiliac atherosclerotic disease. Right femoral venous line with tip in the intrahepatic IVC. Several small scattered retroperitoneal and mesenteric lymph nodes. Reproductive: Hysterectomy. Other: There is diffuse subcutaneous edema. No fluid collection. Musculoskeletal: Osteopenia with degenerative changes of the spine. Avascular necrosis of the left femoral head without acute fracture or cortical collapse. No acute osseous pathology. IMPRESSION: 1. No acute intra-abdominal or pelvic pathology. 2. Postsurgical changes of bowel. No evidence of bowel obstruction. 3. Avascular necrosis of the left femoral head without acute fracture or cortical collapse. 4. Aortic Atherosclerosis (ICD10-I70.0). Electronically Signed   By: Anner Crete M.D.   On: 06/19/2021 23:22   DG Chest Port 1 View  Result Date: 06/19/2021 CLINICAL DATA:  Questionable sepsis EXAM: PORTABLE CHEST 1 VIEW COMPARISON:  05/06/2021 FINDINGS: The heart size and mediastinal contours are within normal limits. Both lungs are clear. The visualized skeletal structures are unremarkable. IMPRESSION: No acute abnormality of the lungs in AP portable projection. Electronically Signed   By: Eddie Candle M.D.   On: 06/19/2021 20:56    EKG: Independently reviewed. 103 bpm.  Sinus tachycardia.   Assessment/Plan Principal Problem:   SIRS (systemic inflammatory response syndrome) ([name]) Active Problems:   Short gut syndrome   Pancytopenia ([name])   Crohn's disease of small & large intestine with SGS   LFTs abnormal   GERD (gastroesophageal reflux disease)   Chronic pain syndrome   On total parenteral nutrition (TPN)    Principal Problem:    SIRS (systemic inflammatory response syndrome) ([place]) Present on admission. Source: No known source of infection, but could have a PICC line infection or an abdominal infection.  Plan:  Sepsis order set. Cultures ordered. IV antibiotics: IV ceftriaxone, IV metronidazole, and IV  vancomycin. IV fluids to provide volume.  Monitor for signs of volume depletion; monitor blood pressure carefully.  Close monitoring.  Initial lactic acid was normal and repeat was also normal.   Active Problems:    Pancytopenia ([place]) May be related to SIRS.  Plan: Recheck CBC in a.m.    Short gut syndrome With chronic TPN use.  Plan: Continue home TPN.  Give IV fluids.    Crohn's disease of small & large intestine with SGS Plan: Continue home medications and outpatient follow-up.    LFTs abnormal Plan: Recheck in the a.m.    GERD (gastroesophageal reflux disease) Plan: Continue home medications.    Chronic pain syndrome Plan: Continue home medications.    On total parenteral nutrition (TPN) Plan: Continue home TPN.   DVT prophylaxis: Lovenox.  Code Status:   Full Code   Disposition Plan:   Patient is from:  Home  Anticipated DC to:  Home  Anticipated DC date:  06/24/21  Anticipated DC barriers: Need for IV antibiotics, pain control  Consults called:  None  Admission status:  Inpatient  Severity of Illness: The appropriate patient status for this patient is INPATIENT. Inpatient status is judged to be reasonable and necessary in order to provide the required intensity of service to ensure the patient's safety. [name] presenting symptoms, physical exam findings, and initial radiographic and laboratory data in the context of their chronic comorbidities is felt to place them at high risk for further clinical deterioration. Furthermore, it is not anticipated that the patient will be medically stable for discharge from the hospital within 2 midnights of admission. The following factors support the patient status of inpatient.   " [name] presenting symptoms include fever, chills, abdominal pain. " The worrisome physical exam findings include back pain and abdominal pain. " The initial radiographic and laboratory data are worrisome because of risk of PICC line  infection and abdominal infection and patient with Crohn's disease and short gut syndrome on TPN. " The chronic co-morbidities include short gut syndrome, chronic TPN use, poor IV access, permanent PICC line, TPN.   * I certify that at the point of admission it is my clinical judgment that the patient will require inpatient hospital care spanning beyond 2 midnights from the point of admission due to high intensity of service, high risk for further deterioration and high frequency of surveillance required.Tacey Ruiz MD [facility]  How to contact the [facility] Attending or Consulting provider [place] or covering provider during after hours [place], for this patient?   Check the care team in [facility] and look for a) attending/consulting [facility] provider listed and b) the [facility] team listed Log into [email or web address] and use [place]'s universal password to access. If you do not have the password, please contact the hospital operator. Locate the [facility] provider you are looking for under [facility] and page to a number that you can be directly reached. If you still have difficulty reaching the provider, please page the [facility] (Director on Call) for the Hospitalists listed on amion for assistance.  06/20/2021, 12:29 AM

## 2021-06-20 NOTE — ED Notes (Signed)
Pharmacy contacted regarding 2nd dose of Rocephin. Pharmacist stated they wanted 2gm of Rocephin to be given. 2nd Rocephin 1gm administered

## 2021-06-20 NOTE — Progress Notes (Addendum)
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  Assessment: 60 year old female on chronic TPN followed by [facility] for short gut syndrome admitted for sepsis from unknown source (possible bacteremia).  Patient is on cyclic TPN over 12 hours at home.  Of note, previous admission in June 2022- TPN was held for a short period of time due to elevated liver enzymes.   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, Crohn's s/p multiple SBR's, short gut syndrome, chronic diarrhea, anasarca, h/o diverticulosis, B12 deficiency, GERD  Glucose / Insulin: 92-113 on BMET Electrolytes: lytes wnl Renal: Scr elevated at 1.49 but downtrending, BUN elevated at 37 also downtrending Hepatic: Alk phos 230, AST 282/ALT 313, 6/29 labs: prealbumin 26.7, TG 104 Intake / Output; MIVF: NS @ 100/hr; received LR 1L bolus; no measured UOP GI Imaging: GI Surgeries / Procedures: n/a Ordered GI Medications: Creon 36,000 units TID w/ meals, Carafate 1g TID w/ meals and QHS, famotidine PRN, Risaquad QD - awaiting med rec for other medications  Central access: PICC line placed 04/03/21 TPN start date: at home  Nutritional Goals - awaiting RD recommendations  PTA TPN Rx (see media tab for Rx image): total 1088 kCal (w/o Lipids) and 1688 kcal w/ lipids; 340 kcal AA, 748 kcal CHO -Formula w/o lipids 4 days/week- receives SMOFlipid 60g on MWF. Adds Infuvite 10 mL and Tralement 4m to each bag daily.  -Dextrose 70%= 220g/day; Plenamine 15% = 85g/day; Kphos 24 mmol/day; K acetate 57 mEq/day; Mg 10 mEq/day; CaGlu 10 mEq/day; Na Acetate 76 mEq/day  Current Nutrition:  NPO  Plan:  TPN on hold for now given ?PICC line infection and elevated LFTs F/u cultures and clinical improvement  MDimple Nanas  PharmD 06/20/2021,10:30 AM

## 2021-06-20 NOTE — Progress Notes (Signed)
Pharmacy Antibiotic Note  Caitlin Peters is a 60 y.o. female admitted on 06/19/2021 with bacteremia. Pt with PICC line for TPN. Pharmacy has been consulted to dose vancomycin   Plan: Vancomycin 37m x 1 then 7566mq24h (AUC 498.2, Scr 1.49, TBW) Follow renal function, cultures and clinical course   Height: 5' 8"  (172.7 cm) Weight: 59 kg (130 lb) IBW/kg (Calculated) : 63.9  Temp (24hrs), Avg:99.4 F (37.4 C), Min:98.4 F (36.9 C), Max:100.8 F (38.2 C)  Recent Labs  Lab 06/15/21 0813 06/19/21 2145 06/19/21 2330  WBC 6.7 2.9* 3.8*  CREATININE 1.39* 1.49*  --   LATICACIDVEN  --  0.6 0.6    Estimated Creatinine Clearance: 37.4 mL/min (A) (by C-G formula based on SCr of 1.49 mg/dL (H)).    Allergies  Allergen Reactions   Meperidine Hives    Other reaction(s): GI Upset Due to [place]    Hyoscyamine Hives and Swelling    Legs swelling   Disorientation   Cefepime Other (See Comments)    Neurotoxicity occurring in setting of AKI. Ceftriaxone tolerated during same admit   Gabapentin Other (See Comments)    unknown   Lyrica [Pregabalin] Other (See Comments)    unknown   Topamax [Topiramate] Other (See Comments)    unknown   Zosyn [Piperacillin Sod-Tazobactam So]     Patient reports it makes her vomit, her neck stiff, and her "heart feel funny"   Fentanyl Rash    Pt is allergic to fentanyl patch related to the glue (gives her a rash) Pt states she is NOT allergic to fentanyl IV medicine   Morphine And Related Rash    Antimicrobials this admission: 7/26 vanc >>  Dose adjustments this admission:   Microbiology results: 7/25 BCx:  7/25 UCx:   Thank you for allowing pharmacy to be a part of this patient's care.  ElDolly RiasPh 06/20/2021, 1:54 AM

## 2021-06-21 DIAGNOSIS — R7401 Elevation of levels of liver transaminase levels: Secondary | ICD-10-CM | POA: Diagnosis not present

## 2021-06-21 DIAGNOSIS — K50819 Crohn's disease of both small and large intestine with unspecified complications: Secondary | ICD-10-CM | POA: Diagnosis not present

## 2021-06-21 DIAGNOSIS — K219 Gastro-esophageal reflux disease without esophagitis: Secondary | ICD-10-CM | POA: Diagnosis not present

## 2021-06-21 DIAGNOSIS — Z789 Other specified health status: Secondary | ICD-10-CM

## 2021-06-21 DIAGNOSIS — A4159 Other Gram-negative sepsis: Secondary | ICD-10-CM | POA: Diagnosis not present

## 2021-06-21 DIAGNOSIS — G894 Chronic pain syndrome: Secondary | ICD-10-CM

## 2021-06-21 DIAGNOSIS — R6510 Systemic inflammatory response syndrome (SIRS) of non-infectious origin without acute organ dysfunction: Secondary | ICD-10-CM | POA: Diagnosis not present

## 2021-06-21 DIAGNOSIS — D61818 Other pancytopenia: Secondary | ICD-10-CM

## 2021-06-21 LAB — COMPREHENSIVE METABOLIC PANEL
ALT: 197 U/L — ABNORMAL HIGH (ref 0–44)
AST: 91 U/L — ABNORMAL HIGH (ref 15–41)
Albumin: 2.4 g/dL — ABNORMAL LOW (ref 3.5–5.0)
Alkaline Phosphatase: 181 U/L — ABNORMAL HIGH (ref 38–126)
Anion gap: 11 (ref 5–15)
BUN: 24 mg/dL — ABNORMAL HIGH (ref 6–20)
CO2: 21 mmol/L — ABNORMAL LOW (ref 22–32)
Calcium: 8.9 mg/dL (ref 8.9–10.3)
Chloride: 108 mmol/L (ref 98–111)
Creatinine, Ser: 1.34 mg/dL — ABNORMAL HIGH (ref 0.44–1.00)
GFR, Estimated: 45 mL/min — ABNORMAL LOW (ref >60)
Glucose, Bld: 88 mg/dL (ref 70–99)
Potassium: 3.9 mmol/L (ref 3.5–5.1)
Sodium: 140 mmol/L (ref 135–145)
Total Bilirubin: 0.6 mg/dL (ref 0.3–1.2)
Total Protein: 5.5 g/dL — ABNORMAL LOW (ref 6.5–8.1)

## 2021-06-21 LAB — DIFFERENTIAL
Abs Immature Granulocytes: 0.04 10*3/uL (ref 0.00–0.07)
Basophils Absolute: 0 10*3/uL (ref 0.0–0.1)
Basophils Relative: 0 %
Eosinophils Absolute: 0.1 10*3/uL (ref 0.0–0.5)
Eosinophils Relative: 4 %
Immature Granulocytes: 1 %
Lymphocytes Relative: 25 %
Lymphs Abs: 0.9 10*3/uL (ref 0.7–4.0)
Monocytes Absolute: 0.4 10*3/uL (ref 0.1–1.0)
Monocytes Relative: 11 %
Neutro Abs: 2 10*3/uL (ref 1.7–7.7)
Neutrophils Relative %: 59 %

## 2021-06-21 LAB — TRIGLYCERIDES: Triglycerides: 91 mg/dL (ref <150)

## 2021-06-21 LAB — CBC
HCT: 23.5 % — ABNORMAL LOW (ref 36.0–46.0)
Hemoglobin: 7.4 g/dL — ABNORMAL LOW (ref 12.0–15.0)
MCH: 30.7 pg (ref 26.0–34.0)
MCHC: 31.5 g/dL (ref 30.0–36.0)
MCV: 97.5 fL (ref 80.0–100.0)
Platelets: 114 10*3/uL — ABNORMAL LOW (ref 150–400)
RBC: 2.41 MIL/uL — ABNORMAL LOW (ref 3.87–5.11)
RDW: 14.7 % (ref 11.5–15.5)
WBC: 3.5 10*3/uL — ABNORMAL LOW (ref 4.0–10.5)
nRBC: 0 % (ref 0.0–0.2)

## 2021-06-21 LAB — MAGNESIUM: Magnesium: 1.9 mg/dL (ref 1.7–2.4)

## 2021-06-21 LAB — PREALBUMIN: Prealbumin: 22.9 mg/dL (ref 18–38)

## 2021-06-21 LAB — PHOSPHORUS: Phosphorus: 4 mg/dL (ref 2.5–4.6)

## 2021-06-21 LAB — URINE CULTURE: Culture: 4000 — AB

## 2021-06-21 LAB — GLUCOSE, CAPILLARY
Glucose-Capillary: 110 mg/dL — ABNORMAL HIGH (ref 70–99)
Glucose-Capillary: 93 mg/dL (ref 70–99)

## 2021-06-21 MED ORDER — INSULIN ASPART 100 UNIT/ML IJ SOLN
0.0000 [IU] | Freq: Four times a day (QID) | INTRAMUSCULAR | Status: DC
  Administered 2021-06-22 – 2021-06-25 (×2): 1 [IU] via SUBCUTANEOUS

## 2021-06-21 MED ORDER — PROSOURCE PLUS PO LIQD
30.0000 mL | Freq: Two times a day (BID) | ORAL | Status: DC
  Administered 2021-06-21 – 2021-06-30 (×18): 30 mL via ORAL
  Filled 2021-06-21 (×18): qty 30

## 2021-06-21 MED ORDER — TRAVASOL 10 % IV SOLN
INTRAVENOUS | Status: DC
  Filled 2021-06-21: qty 528

## 2021-06-21 MED ORDER — SODIUM CHLORIDE 0.9 % IV SOLN: INTRAVENOUS | Status: AC

## 2021-06-21 NOTE — Progress Notes (Signed)
PHARMACY - TOTAL PARENTERAL NUTRITION CONSULT NOTE   Indication: Short bowel syndrome  Patient Measurements: Height: 5' 8"  (172.7 cm) Weight: 59 kg (130 lb) IBW/kg (Calculated) : 63.9 TPN AdjBW (KG): 61.2 Body mass index is 19.77 kg/m. Usual Weight:   Assessment:  60 year old female on chronic TPN followed by [facility] for short gut syndrome admitted for sepsis from unknown source (possible bacteremia).  Patient is on cyclic TPN over 12 hours at home.  Of note, previous admission in June 2022- TPN was held for a short period of time due to elevated liver enzymes.   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, Crohn's s/p multiple SBR's, short gut syndrome, chronic diarrhea, anasarca, h/o diverticulosis, B12 deficiency, GERD  Glucose / Insulin:  Electrolytes: wnl Renal: SCr 1.5 on admit > 1.3 Hepatic: LFTs elevated on admission, still elevated but improved, TG 91, PreAlb 22.9 Intake / Output;  MIVF: NS at 100 ml/hr GI Imaging: GI Surgeries / Procedures:   Central access: DL Femoral PICC from 04/03/21 TPN start date: in patient 7/27   Nutritional Goals (per RD recommendation on 7/27): kCal: 1800-2000, Protein: 90-100g, Fluid: >/= 2.2 L/day Goal TPN rate is 65 mL/hr (provides 86 g of protein and 1370 kcals per day) - based on home TPN formula  Current Nutrition:  NPO  Plan:  Anticipate d/c home once Afebrile x 24 hr  Start TPN at 46m/hr at 1800 Electrolytes in TPN: Na 451m/L, K 5052mL, Ca 7.5mE27m, Mg 7.5mEq10m and Phos 15mmo25m Cl:Ac 1:1 Add standard MVI and trace elements to TPN Initiate Sensitive q6h SSI and adjust as needed  Reduce MIVF to 60 mL/hr at 1800 Monitor TPN labs on Mon/Thurs  Maleki Hippe,Minda DittoD 06/21/2021,7:12 AM

## 2021-06-21 NOTE — TOC Progression Note (Addendum)
Transition of Care [facility]) - Progression Note    Patient Details  Name: Caitlin Peters MRN: [id number] Date of Birth: 11-Jun-1961  Transition of [facility]) CM/SW Contact  Purcell Mouton, RN Phone Number: 06/21/2021, 2:47 PM  Clinical Narrative:     Pt is active with [facility]. TOC will continue to follow.   Expected Discharge Plan: [place] Barriers to Discharge: No Barriers Identified  Expected Discharge Plan and Services Expected Discharge Plan: [place] arrangements for the past 2 months: Single Family Home                                       Social Determinants of Health (SDOH) Interventions    Readmission Risk Interventions Readmission Risk Prevention Plan 02/10/2021 12/21/2020 11/09/2020  Transportation Screening Complete Complete Complete  PCP or Specialist Appt within 3-5 Days - - -  Not Complete comments - - -  HRI or [place] Work Consult for [place] Planning/Counseling - - -  SW consult not completed comments - - -  Palliative Care Screening - - -  Medication Review Press photographer) Complete - Complete  PCP or Specialist appointment within 3-5 days of discharge - Complete Complete  HRI or Home Care Consult Complete Complete Complete  SW Recovery Care/Counseling Consult Complete Complete -  Palliative Care Screening Not Applicable - Not [place] Not Applicable - Not Applicable

## 2021-06-21 NOTE — Progress Notes (Signed)
PROGRESS NOTE    Caitlin Peters  MNO:[id number] DOB: 06-02-1961 DOA: 06/19/2021 PCP: [name], [name], MD   Brief Narrative:  The patient is a 60 year old African-American female with a past medical history significant for but not limited to short gut syndrome requiring TPN, history of Crohn's disease with multiple surgeries, chronic pain syndrome, history of chronic PICC line for IV access as well as other comorbidities including chronic pancreatitis who presented to the ED on 06/19/2021 with fever and chills.  She reports that she has not felt well since 05/29/2021 when she was hospitalized late in June 2022.  Since leaving the hospital she has noticed pain in her left shoulder and arm.  She states that fever and chills began the day before admission and has been intermittent.  She reported diaphoresis at night and her fever at home was as high as 102.  She also admits that chronic abdominal pain located upper abdomen on the left side that radiates to the back that is 7 out of 10 and characterized as cramping and sharp as well as dull at times.  She states that her abdominal pain is alleviated by nothing and exacerbated by eating.  She had fevers and chills that were alleviated by nothing and exacerbated by nothing.  Associated symptoms include nausea and vomiting intermittently.  She states that she has chronic diarrhea more like Jell-O consistency but did not notice any blood and did not have any hematuria or dysuria.  She presented the ED and she is found to be febrile at 108 with a pulse rate of 104.  She was leukopenic and did have abnormal LFTs which are now improving.  Urinalysis showed no leukocytes or nitrites and cultures were drawn however.  She was placed on IV antibiotics with IV vancomycin and IV ceftriaxone  Assessment & Plan:   Principal Problem:   SIRS (systemic inflammatory response syndrome) ([name]) Active Problems:   Crohn's disease of small & large intestine with SGS    Short gut syndrome   GERD (gastroesophageal reflux disease)   Chronic pain syndrome   On total parenteral nutrition (TPN)   LFTs abnormal   Pancytopenia ([name])  SIRS (systemic inflammatory response syndrome) ([place]) -Present on admission. Source: No known source of infection, but could have a PICC line infection or an abdominal infection.  -She does have a history of PICC line infections previously -Sepsis order set. Cultures ordered. -Continue IV antibiotics: IV ceftriaxone, IV metronidazole, and IV vancomycin. -She was given IV fluids to provide volume and remains on Prosource +30 mL p.o. twice daily, normal saline at 60 mL/h -Monitor for signs of volume depletion; monitor blood pressure carefully.  -Close monitoring of temperature curve and WBC; WBC is gone from 2.9 -> 3.8 -> 4.6 -> 3.5 -Initial lactic acid was normal and repeat was also normal.  Pancytopenia ([name]) -May be related to SIRS. -WBC is now 3.5, hemoglobin/hematocrit is 7.4/23.5, and platelet count is 114 -Continue monitor and trend and repeat CBC in a.m.   Short gut syndrome -With chronic TPN use.   -Continue home TPN.  Give IV fluids.   Abnormal LFTs In the setting of TPN and possible infection -Patient's AST on admission was 544 is now trended down to 91 and patient's ALT was 365 and has now trended down to 197 -Monitor and trend LFTs and if necessary will obtain a right upper quadrant ultrasound as well as an acute hepatitis panel  GERD (gastroesophageal reflux disease) -Continue home medications with pantoprazole  40 mg p.o. daily as well as famotidine 20 mg p.o. daily as needed heartburn and indigestion -Also continue sucralfate 1 g p.o. 3 times daily   Chronic pain syndrome -Continue home medication with methadone 5 mg p.o. 5 times daily as well as p.o. hydromorphone 4 mg every 4 hours as needed severe pain as well as IV hydromorphone 1 mg every 2 hours as needed severe pain -Continue with methocarbamol 500 g  p.o. twice daily   Crohn's disease of the small and large intestine with short gut syndrome  -Continue home TPN. -Continue with home budesonide 9 mg p.o. daily and with teduglutide 1.5 mg subcu daily for Short Gut -Followed by gastroenterology at [facility]  Left shoulder pain -Had an MRI towards the end of June and did not show any acute findings and is seeing her primary care provider -She has been referred to orthopedic surgery and has appointment August 1.  AKI on chronic kidney disease stage IIIa Metabolic acidosis -Stable and improved.  Patient's BUNs/creatinine went from 45/1.49 and trended all the way down to 24/1.34 Continue with IV fluid hydration as above -Patient has a small metabolic acidosis with a CO2 21, anion gap of 11, chloride level of 108 -Avoid further nephrotoxic medications, contrast dyes, hypotension and renally dose medications Repeat CMP in a.m.  Depression and anxiety -Continue with bupropion ER 20 g p.o. daily and duloxetine 90 mg p.o. daily  Hypertension -Continue to monitor blood pressures per protocol next-continue with amlodipine 10 mg p.o. daily as well as carvedilol 25 mg p.o. twice daily  History of recurrent pancreatitis -Continue with Creon 36,000 units p.o. 3 times daily with meals  DVT prophylaxis: Enoxaparin 40 mg sq q24h Code Status: FULL CODE  Family Communication: No family present at bedside Disposition Plan: Pending further clinical improvement  Status is: Inpatient  Remains inpatient appropriate because:Unsafe d/c plan, IV treatments appropriate due to intensity of illness or inability to take PO, and Inpatient level of care appropriate due to severity of illness  Dispo:  Patient From: Home  Planned Disposition: Home  Medically stable for discharge: No    Consultants:  None  Procedures: None  Antimicrobials:  Anti-infectives (From admission, onward)    Start     Dose/Rate Route Frequency Ordered Stop   06/20/21 2300   cefTRIAXone (ROCEPHIN) 2 g in sodium chloride 0.9 % 100 mL IVPB        2 g 200 mL/hr over 30 Minutes Intravenous Every 24 hours 06/20/21 0354 06/27/21 2259   06/20/21 2200  vancomycin (VANCOREADY) IVPB 750 mg/150 mL        750 mg 150 mL/hr over 60 Minutes Intravenous Every 24 hours 06/20/21 0152     06/20/21 0400  metroNIDAZOLE (FLAGYL) IVPB 500 mg        500 mg 100 mL/hr over 60 Minutes Intravenous Every 8 hours 06/20/21 0354     06/19/21 2345  cefTRIAXone (ROCEPHIN) 1 g in sodium chloride 0.9 % 100 mL IVPB        1 g 200 mL/hr over 30 Minutes Intravenous  Once 06/19/21 2341 06/20/21 0151   06/19/21 2300  cefTRIAXone (ROCEPHIN) 1 g in sodium chloride 0.9 % 100 mL IVPB        1 g 200 mL/hr over 30 Minutes Intravenous  Once 06/19/21 2253 06/20/21 0011   06/19/21 2300  vancomycin (VANCOCIN) IVPB 1000 mg/200 mL premix        1,000 mg 200 mL/hr over 60 Minutes Intravenous  Once  06/19/21 2253 06/20/21 0113        Subjective: Seen and examined at bedside and states that she is feeling better.  She denies chest pain, lightheadedness or dizziness.  No nausea or vomiting.  Continues to have some abdominal pain on the left side.  No other concerns or complaints at this time.  Objective: Vitals:   06/21/21 0800 06/21/21 1008 06/21/21 1205 06/21/21 1633  BP: (!) 153/84 (!) 143/61 135/70 (!) 128/58  Pulse: 79  66   Resp:   18   Temp:   98.4 F (36.9 C)   TempSrc:   Oral   SpO2:   99%   Weight:      Height:        Intake/Output Summary (Last 24 hours) at 06/21/2021 1939 Last data filed at 06/21/2021 1841 Gross per 24 hour  Intake 2608.76 ml  Output 1000 ml  Net 1608.76 ml   Filed Weights   06/19/21 2057 06/19/21 2122 06/21/21 0353  Weight: 61.2 kg 59 kg 64.8 kg   Examination: Physical Exam:  Constitutional: WN/WD chronically ill appearing AAF in  NAD and appears calm and comfortable Eyes: Lids and conjunctivae normal, sclerae anicteric  ENMT: External Ears, Nose appear normal.  Grossly normal hearing. Neck: Appears normal, supple, no cervical masses, normal ROM, no appreciable thyromegaly; no JVD Respiratory: Diminished to auscultation bilaterally, no wheezing, rales, rhonchi or crackles. Normal respiratory effort and patient is not tachypenic. No accessory muscle use. Unlabored breathing  Cardiovascular: RRR, no murmurs / rubs / gallops.  Abdomen: Soft, mildly tender, Distended. Bowel sounds positive.  GU: Deferred. Musculoskeletal: No clubbing / cyanosis of digits/nails. No joint deformity upper and lower extremities. Good  Skin: No rashes, lesions, ulcers on a limited skin evaluation. No induration; Warm and dry.  Neurologic: CN 2-12 grossly intact with no focal deficits. Romberg sign and cerebellar reflexes not assessed.  Psychiatric: Normal judgment and insight. Alert and oriented x 3. Normal mood and appropriate affect.   Data Reviewed: I have personally reviewed following labs and imaging studies  CBC: Recent Labs  Lab 06/15/21 0813 06/19/21 2145 06/19/21 2330 06/20/21 0454 06/21/21 0517  WBC 6.7 2.9* 3.8* 4.6 3.5*  NEUTROABS 4.4 2.4 3.2  --  2.0  HGB 10.9* 7.6* 7.2* 7.4* 7.4*  HCT 32.4* 23.2* 21.8* 23.2* 23.5*  MCV 91.0 92.8 92.4 93.5 97.5  PLT 243 111* 110* 117* 387*   Basic Metabolic Panel: Recent Labs  Lab 06/15/21 0813 06/19/21 2145 06/20/21 0454 06/21/21 0517  NA 141 136 139 140  K 3.6 3.6 3.9 3.9  CL 108 105 111 108  CO2 20* 22 24 21*  GLUCOSE 109* 92 113* 88  BUN 28* 45* 37* 24*  CREATININE 1.39* 1.49* 1.46* 1.34*  CALCIUM 9.3 8.4* 8.4* 8.9  MG  --   --   --  1.9  PHOS  --   --   --  4.0   GFR: Estimated Creatinine Clearance: 45 mL/min (A) (by C-G formula based on SCr of 1.34 mg/dL (H)). Liver Function Tests: Recent Labs  Lab 06/15/21 0813 06/19/21 2145 06/20/21 0454 06/21/21 0517  AST 17 544* 282* 91*  ALT 23 365* 313* 197*  ALKPHOS 112 244* 230* 181*  BILITOT 0.7 1.0 1.0 0.6  PROT 7.6 5.7* 5.7* 5.5*  ALBUMIN 3.3*  2.6* 2.5* 2.4*   Recent Labs  Lab 06/19/21 2330  LIPASE 30   No results for input(s): AMMONIA in the last 168 hours. Coagulation Profile: Recent Labs  Lab 06/19/21 2145  INR 1.0   Cardiac Enzymes: No results for input(s): CKTOTAL, CKMB, CKMBINDEX, TROPONINI in the last 168 hours. BNP (last 3 results) No results for input(s): PROBNP in the last 8760 hours. HbA1C: No results for input(s): HGBA1C in the last 72 hours. CBG: Recent Labs  Lab 06/21/21 1801  GLUCAP 93   Lipid Profile: Recent Labs    06/21/21 0517  TRIG 91   Thyroid Function Tests: No results for input(s): TSH, T4TOTAL, FREET4, T3FREE, THYROIDAB in the last 72 hours. Anemia Panel: No results for input(s): VITAMINB12, FOLATE, FERRITIN, TIBC, IRON, RETICCTPCT in the last 72 hours. Sepsis Labs: Recent Labs  Lab 06/19/21 2145 06/19/21 2330 06/20/21 0454  LATICACIDVEN 0.6 0.6 0.5    Recent Results (from the past 240 hour(s))  Urine Culture     Status: None   Collection Time: 06/12/21  8:15 PM   Specimen: Urine, Clean Catch  Result Value Ref Range Status   Specimen Description   Final    URINE, CLEAN CATCH Performed at [facility], [place] [address] [place], [place] [address]    Special Requests   Final    NONE Performed at [facility], [place] [address] [place], [place] [address]    Culture   Final    NO GROWTH Performed at [facility], [place] [address] [place], [place] [address]    Report Status 06/14/2021 FINAL  Final  Resp Panel by RT-PCR (Flu A&B, Covid) Nasopharyngeal Swab     Status: None   Collection Time: 06/19/21  9:20 PM   Specimen: Nasopharyngeal Swab; Nasopharyngeal(NP) swabs in vial transport medium  Result Value Ref Range Status   SARS Coronavirus 2 by RT PCR NEGATIVE NEGATIVE Final    Comment: (NOTE) SARS-CoV-2 target nucleic acids are NOT DETECTED.  The SARS-CoV-2 RNA is generally detectable in upper respiratory specimens during the  acute phase of infection. The lowest concentration of SARS-CoV-2 viral copies this assay can detect is 138 copies/mL. A negative result does not preclude SARS-Cov-2 infection and should not be used as the sole basis for treatment or other patient management decisions. A negative result may occur with  improper specimen collection/handling, submission of specimen other than nasopharyngeal swab, presence of viral mutation(s) within the areas targeted by this assay, and inadequate number of viral copies(<138 copies/mL). A negative result must be combined with clinical observations, patient history, and epidemiological information. The expected result is Negative.  Fact Sheet for Patients:  EntrepreneurPulse.com.au  Fact Sheet for Healthcare Providers:  IncredibleEmployment.be  This test is no t yet approved or cleared by the [place] FDA and  has been authorized for detection and/or diagnosis of SARS-CoV-2 by FDA under an Emergency Use Authorization (EUA). This EUA will remain  in effect (meaning this test can be used) for the duration of the COVID-19 declaration under Section 564(b)(1) of the Act, 21 U.S.C.section 360bbb-3(b)(1), unless the authorization is terminated  or revoked sooner.       Influenza A by PCR NEGATIVE NEGATIVE Final   Influenza B by PCR NEGATIVE NEGATIVE Final    Comment: (NOTE) The Xpert Xpress SARS-CoV-2/FLU/RSV plus assay is intended as an aid in the diagnosis of influenza from Nasopharyngeal swab specimens and should not be used as a sole basis for treatment. Nasal washings and aspirates are unacceptable for Xpert Xpress SARS-CoV-2/FLU/RSV testing.  Fact Sheet for Patients: EntrepreneurPulse.com.au  Fact Sheet for Healthcare Providers: IncredibleEmployment.be  This test is not yet approved or  cleared by the [place] and has been authorized for detection and/or  diagnosis of SARS-CoV-2 by FDA under an Emergency Use Authorization (EUA). This EUA will remain in effect (meaning this test can be used) for the duration of the COVID-19 declaration under Section 564(b)(1) of the Act, 21 U.S.C. section 360bbb-3(b)(1), unless the authorization is terminated or revoked.  Performed at [facility], [place] [address], [place], [place] [address]   Urine Culture     Status: Abnormal   Collection Time: 06/19/21  9:45 PM   Specimen: In/Out Cath Urine  Result Value Ref Range Status   Specimen Description   Final    IN/OUT CATH URINE Performed at [place] [address] [place], [place] [address]    Special Requests   Final    NONE Performed at [facility], [place] [address], [place], [place] [address]    Culture (A)  Final    4,000 COLONIES/mL STAPHYLOCOCCUS EPIDERMIDIS 10,000 COLONIES/mL LACTOBACILLUS SPECIES Standardized susceptibility testing for this organism is not available. Performed at [facility], [place] [address] [place], [place] [address]    Report Status 06/21/2021 FINAL  Final  Blood Culture (routine x 2)     Status: None (Preliminary result)   Collection Time: 06/19/21  9:45 PM   Specimen: BLOOD  Result Value Ref Range Status   Specimen Description   Final    BLOOD RIGHT ANTECUBITAL Performed at [place] [address], [place], [place] [address]    Special Requests   Final    BOTTLES DRAWN AEROBIC AND ANAEROBIC Blood Culture adequate volume Performed at [place] [address] [place], [place] [address]    Culture  Setup Time NO ORGANISMS SEEN AEROBIC BOTTLE ONLY   Final   Culture   Final    NO GROWTH 1 DAY Performed at [facility], [place] [address], [place], [place] [address]    Report Status PENDING  Incomplete  MRSA Next Gen by PCR, Nasal     Status: None   Collection Time: 06/20/21  6:27 PM   Specimen:  Nasal Mucosa; Nasal Swab  Result Value Ref Range Status   MRSA by PCR Next Gen NOT DETECTED NOT DETECTED Final    Comment: (NOTE) The GeneXpert MRSA Assay (FDA approved for NASAL specimens only), is one component of a comprehensive MRSA colonization surveillance program. It is not intended to diagnose MRSA infection nor to guide or monitor treatment for MRSA infections. Test performance is not FDA approved in patients less than 44 years old. Performed at [facility], [place] [address], [place],  [address]     RN Pressure Injury Documentation:     Estimated body mass index is 21.71 kg/m as calculated from the following:   Height as of this encounter: 5' 8"  (1.727 m).   Weight as of this encounter: 64.8 kg.  Malnutrition Type:  Nutrition Problem: Increased nutrient needs Etiology: acute illness, chronic illness   Malnutrition Characteristics:  Signs/Symptoms: estimated needs   Nutrition Interventions:  Interventions: Prostat, TPN, MVI     Radiology Studies: CT Abdomen Pelvis Wo Contrast  Result Date: 06/19/2021 CLINICAL DATA:  60 year old female with abdominal pain and fever. EXAM: CT ABDOMEN AND PELVIS WITHOUT CONTRAST TECHNIQUE: Multidetector CT imaging of the abdomen and pelvis was performed following the standard protocol without IV contrast. COMPARISON:  CT abdomen pelvis dated 06/12/2021. FINDINGS: Evaluation of this exam is limited  in the absence of intravenous contrast. Evaluation is also limited due to paucity of intra-abdominal fat. Lower chest: Minimal bibasilar dependent atelectasis. The visualized lung bases are otherwise clear. No intra-abdominal free air or free fluid. Hepatobiliary: The liver is unremarkable. There is pneumobilia. Cholecystectomy. Pancreas: The pancreas is grossly unremarkable. Spleen: Normal in size without focal abnormality. Adrenals/Urinary Tract: The adrenal glands are unremarkable. The kidneys, visualized ureters,  and urinary bladder appear unremarkable. Stomach/Bowel: Postsurgical changes of bowel with multiple surgical anastomotic sutures. Evaluation of the bowel is limited in the absence of oral contrast and paucity of intra-abdominal fat. No evidence of bowel obstruction. Vascular/Lymphatic: Mild aortoiliac atherosclerotic disease. Right femoral venous line with tip in the intrahepatic IVC. Several small scattered retroperitoneal and mesenteric lymph nodes. Reproductive: Hysterectomy. Other: There is diffuse subcutaneous edema. No fluid collection. Musculoskeletal: Osteopenia with degenerative changes of the spine. Avascular necrosis of the left femoral head without acute fracture or cortical collapse. No acute osseous pathology. IMPRESSION: 1. No acute intra-abdominal or pelvic pathology. 2. Postsurgical changes of bowel. No evidence of bowel obstruction. 3. Avascular necrosis of the left femoral head without acute fracture or cortical collapse. 4. Aortic Atherosclerosis (ICD10-I70.0). Electronically Signed   By: Anner Crete M.D.   On: 06/19/2021 23:22   DG Chest Port 1 View  Result Date: 06/19/2021 CLINICAL DATA:  Questionable sepsis EXAM: PORTABLE CHEST 1 VIEW COMPARISON:  05/06/2021 FINDINGS: The heart size and mediastinal contours are within normal limits. Both lungs are clear. The visualized skeletal structures are unremarkable. IMPRESSION: No acute abnormality of the lungs in AP portable projection. Electronically Signed   By: Eddie Candle M.D.   On: 06/19/2021 20:56    Scheduled Meds:  (feeding supplement) PROSource Plus  30 mL Oral BID BM   acidophilus  1 capsule Oral Daily   amLODipine  10 mg Oral Daily   budesonide  9 mg Oral Daily   buPROPion ER  200 mg Oral Daily   calcium carbonate  500 mg Oral Daily   carvedilol  25 mg Oral BID WC   Chlorhexidine Gluconate Cloth  6 each Topical Daily   cholecalciferol  1,000 Units Oral Daily   cycloSPORINE  1 drop Both Eyes BID   DULoxetine  90 mg Oral  Daily   enoxaparin (LOVENOX) injection  40 mg Subcutaneous Q24H   estradiol  2 mg Oral Daily   hydrALAZINE  100 mg Oral TID   insulin aspart  0-9 Units Subcutaneous Q6H   levETIRAcetam  500 mg Oral BID   lipase/protease/amylase  36,000 Units Oral TID WC   methadone  5 mg Oral 5 X Daily   methocarbamol  500 mg Oral BID   multivitamin with minerals   Oral Daily   pantoprazole  40 mg Oral Daily   polyvinyl alcohol  1 drop Both Eyes QID   sodium chloride flush  10-40 mL Intracatheter Q12H   sucralfate  1 g Oral TID WC & HS   Teduglutide (rDNA)  1.5 mg Subcutaneous Daily   vitamin B-12  1,000 mcg Oral Daily   Continuous Infusions:  sodium chloride     sodium chloride 60 mL/hr at 06/21/21 1805   cefTRIAXone (ROCEPHIN)  IV 2 g (06/21/21 0002)   metronidazole 500 mg (06/21/21 1236)   TPN ADULT (ION) 40 mL/hr at 06/21/21 1732   vancomycin 750 mg (06/20/21 2226)    LOS: 1 day   Kerney Elbe, DO [facility] PAGER is on AMION  If 7PM-7AM, please contact  night-coverage [email or web address]

## 2021-06-21 NOTE — Plan of Care (Signed)
  Problem: Clinical Measurements: Goal: Will remain free from infection Outcome: Progressing Goal: Diagnostic test results will improve Outcome: Progressing   Problem: Activity: Goal: Risk for activity intolerance will decrease Outcome: Progressing

## 2021-06-21 NOTE — Progress Notes (Signed)
Initial Nutrition Assessment  DOCUMENTATION CODES:   Not applicable  INTERVENTION:  - will order 30 ml Prosource Plus BID, each supplement provides 100 kcal and 15 grams protein.  - TPN re-start when feasible.   NUTRITION DIAGNOSIS:   Increased nutrient needs related to acute illness, chronic illness as evidenced by estimated needs.  GOAL:   Patient will meet greater than or equal to 90% of their needs  MONITOR:   PO intake, Supplement acceptance, Labs, Weight trends, I & O's, Other (Comment) (TPN regimen)  REASON FOR ASSESSMENT:   Malnutrition Screening Tool, Consult New TPN/TNA  ASSESSMENT:   60 y.o. female with medical history for short gut syndrome requiring TPN, Crohn's disease s/p multiple surgeries, chronic pain, chronic PICC line, HTN, vitamin B12 deficiency, osteoporosis, depression, GERD, stage 3 CKD, diverticulosis, avascular necrosis of the bone, iron deficiency anemia. She presented to the ED on 7/25 with fever and chills.  Patient is well know to RD from previous hospitalizations. TPN on hold d/t LFTs yesterday, but to resume today at 40 ml/hr at 1800.   She is on a Regular diet and ate 100% of breakfast which she reports was a muffin and hot chocolate. She eats at home, usually foods prepared by her daughter or her daughter's boyfriend.  She is on gattex and has been using this medication since February or March, per her report. She states that she has seen an improvement in symptoms while on it and that she has to receive it daily otherwise it's like starting all over with her symptoms.   She reports having severe nausea today and that she had an episode of diarrhea after breakfast.   Weight today is 143 lb and weight on 7/25 was 131 lb. Weight on 6/28 was 148 lb. Will monitor closely. I/O flow sheet indicates she is +4.4 L since admission.    Labs reviewed; BUN: 24 mg/dl, creatinine: 1.34 mg/dl, LFTs elevated but significantly down, GFR: 45 ml/min,  triglycerides: 91 mg/dl. Medications reviewed; 1 capsule risaquad/day, 9 mg entocort/day, 1000 units cholecalciferol/day, 36000 units creon TID, 1 tablet multivitamin with minerals/day, 1 g carafate TID, 1000 mcg oral cyanocobalamin/day. IVF; NS @ 100 ml/hr.     NUTRITION - FOCUSED PHYSICAL EXAM:  Completed; no muscle or fat depletions, mild edema to BLE.  Diet Order:   Diet Order             Diet regular Room service appropriate? Yes; Fluid consistency: Thin  Diet effective now                   EDUCATION NEEDS:   No education needs have been identified at this time  Skin:  Skin Assessment: Reviewed RN Assessment  Last BM:  7/25  Height:   Ht Readings from Last 1 Encounters:  06/19/21 5' 8"  (1.727 m)    Weight:   Wt Readings from Last 1 Encounters:  06/21/21 64.8 kg      Estimated Nutritional Needs:  Kcal:  1800-2000 kcal Protein:  90-100 grams Fluid:  >/= 2.2 L/day      Jarome Matin, MS, RD, LDN, CNSC Inpatient Clinical Dietitian RD pager # available in AMION  After hours/weekend pager # available in [facility]

## 2021-06-22 DIAGNOSIS — A4159 Other Gram-negative sepsis: Secondary | ICD-10-CM | POA: Diagnosis not present

## 2021-06-22 DIAGNOSIS — R6510 Systemic inflammatory response syndrome (SIRS) of non-infectious origin without acute organ dysfunction: Secondary | ICD-10-CM | POA: Diagnosis not present

## 2021-06-22 DIAGNOSIS — K219 Gastro-esophageal reflux disease without esophagitis: Secondary | ICD-10-CM | POA: Diagnosis not present

## 2021-06-22 DIAGNOSIS — K50819 Crohn's disease of both small and large intestine with unspecified complications: Secondary | ICD-10-CM | POA: Diagnosis not present

## 2021-06-22 DIAGNOSIS — R945 Abnormal results of liver function studies: Secondary | ICD-10-CM | POA: Diagnosis not present

## 2021-06-22 DIAGNOSIS — G894 Chronic pain syndrome: Secondary | ICD-10-CM | POA: Diagnosis not present

## 2021-06-22 DIAGNOSIS — A419 Sepsis, unspecified organism: Secondary | ICD-10-CM

## 2021-06-22 DIAGNOSIS — R7401 Elevation of levels of liver transaminase levels: Secondary | ICD-10-CM | POA: Diagnosis not present

## 2021-06-22 LAB — BLOOD CULTURE ID PANEL (REFLEXED) - BCID2

## 2021-06-22 LAB — CBC WITH DIFFERENTIAL/PLATELET
Abs Immature Granulocytes: 0.01 10*3/uL (ref 0.00–0.07)
Basophils Absolute: 0 10*3/uL (ref 0.0–0.1)
Basophils Relative: 0 %
Eosinophils Absolute: 0.1 10*3/uL (ref 0.0–0.5)
Eosinophils Relative: 2 %
HCT: 23.5 % — ABNORMAL LOW (ref 36.0–46.0)
Hemoglobin: 7.3 g/dL — ABNORMAL LOW (ref 12.0–15.0)
Immature Granulocytes: 0 %
Lymphocytes Relative: 35 %
Lymphs Abs: 0.9 10*3/uL (ref 0.7–4.0)
MCH: 30.4 pg (ref 26.0–34.0)
MCHC: 31.1 g/dL (ref 30.0–36.0)
MCV: 97.9 fL (ref 80.0–100.0)
Monocytes Absolute: 0.4 10*3/uL (ref 0.1–1.0)
Monocytes Relative: 14 %
Neutro Abs: 1.2 10*3/uL — ABNORMAL LOW (ref 1.7–7.7)
Neutrophils Relative %: 49 %
Platelets: 129 10*3/uL — ABNORMAL LOW (ref 150–400)
RBC: 2.4 MIL/uL — ABNORMAL LOW (ref 3.87–5.11)
RDW: 14.5 % (ref 11.5–15.5)
WBC: 2.5 10*3/uL — ABNORMAL LOW (ref 4.0–10.5)
nRBC: 0 % (ref 0.0–0.2)

## 2021-06-22 LAB — COMPREHENSIVE METABOLIC PANEL
ALT: 137 U/L — ABNORMAL HIGH (ref 0–44)
AST: 40 U/L (ref 15–41)
Albumin: 2.3 g/dL — ABNORMAL LOW (ref 3.5–5.0)
Alkaline Phosphatase: 167 U/L — ABNORMAL HIGH (ref 38–126)
Anion gap: 4 — ABNORMAL LOW (ref 5–15)
BUN: 21 mg/dL — ABNORMAL HIGH (ref 6–20)
CO2: 23 mmol/L (ref 22–32)
Calcium: 8.6 mg/dL — ABNORMAL LOW (ref 8.9–10.3)
Chloride: 112 mmol/L — ABNORMAL HIGH (ref 98–111)
Creatinine, Ser: 1.58 mg/dL — ABNORMAL HIGH (ref 0.44–1.00)
GFR, Estimated: 37 mL/min — ABNORMAL LOW (ref >60)
Glucose, Bld: 117 mg/dL — ABNORMAL HIGH (ref 70–99)
Potassium: 3.6 mmol/L (ref 3.5–5.1)
Sodium: 139 mmol/L (ref 135–145)
Total Bilirubin: 0.5 mg/dL (ref 0.3–1.2)
Total Protein: 5.6 g/dL — ABNORMAL LOW (ref 6.5–8.1)

## 2021-06-22 LAB — GLUCOSE, CAPILLARY
Glucose-Capillary: 113 mg/dL — ABNORMAL HIGH (ref 70–99)
Glucose-Capillary: 121 mg/dL — ABNORMAL HIGH (ref 70–99)
Glucose-Capillary: 94 mg/dL (ref 70–99)

## 2021-06-22 LAB — MAGNESIUM: Magnesium: 1.9 mg/dL (ref 1.7–2.4)

## 2021-06-22 LAB — PHOSPHORUS: Phosphorus: 3.6 mg/dL (ref 2.5–4.6)

## 2021-06-22 MED ORDER — SODIUM CHLORIDE 0.9 % IV SOLN
2.0000 g | Freq: Two times a day (BID) | INTRAVENOUS | Status: DC
  Administered 2021-06-22: 2 g via INTRAVENOUS
  Filled 2021-06-22: qty 2

## 2021-06-22 MED ORDER — SODIUM CHLORIDE 0.9 % IV SOLN: 2.0000 g | Freq: Two times a day (BID) | INTRAVENOUS | Status: DC

## 2021-06-22 NOTE — Progress Notes (Signed)
Spoke w/pharmacist.  Okay to d/c TPN w/out rate reduction.

## 2021-06-22 NOTE — Progress Notes (Signed)
PHARMACY - TOTAL PARENTERAL NUTRITION CONSULT NOTE   Indication: Short bowel syndrome  Assessment:  60 year old female on chronic TPN followed by [facility] for short gut syndrome admitted for sepsis from unknown source (possible bacteremia).  Patient is on cyclic TPN over 12 hours at home.  Of note, previous admission in June 2022- TPN was held for a short period of time due to elevated liver enzymes.   Pharmacy received call from micro lab this morning, BCx growing GNR, BCID panel currently in process.   Plan:  Will discontinue/hold TPN at this time per discussion w/MD for bacteremia.   TPN consult and associated labs/orders discontinued. Please re-consult pharmacy when safe to resume TPN.  Signing off.  Lenis Noon PharmD 06/22/2021,10:29 AM

## 2021-06-22 NOTE — Progress Notes (Signed)
RFV: follow up for hx of discitis Patient ID: Caitlin Peters, female   DOB: 09-18-1961, 60 y.o.   MRN: [id number]  HPI 60yo F with crohn's disease, short gut syndrome, hx of indwelling femoral line. She reports still ongoing left shoulder pain. At last visit she was here she was referred to ED for evaluation for which they did extensive imaging that unable to find significant abnormality. Ruled out septic arthritis. She has seen her PCP who has referred her to orthopedics for further evaluation/management. Appt next week.  Denies fever, chills. Still attempting to keep up with nutritional intake. Has upcoming appt with her GI physician.  No facility-administered encounter medications on file as of 06/19/2021.   Outpatient Encounter Medications as of 06/19/2021  Medication Sig   acetaminophen (TYLENOL) 325 MG tablet Take 650 mg by mouth every 6 (six) hours as needed for mild pain.    amLODipine (NORVASC) 10 MG tablet Take 10 mg by mouth daily.   budesonide (ENTOCORT EC) 3 MG 24 hr capsule Take 3 capsules (9 mg total) by mouth daily. (Patient taking differently: Take 3 mg by mouth every 3 (three) days.)   buPROPion (WELLBUTRIN SR) 100 MG 12 hr tablet Take 200 mg by mouth daily.   Calcium 200 MG TABS Take 200 mg by mouth daily.   cholecalciferol (VITAMIN D3) 25 MCG (1000 UT) tablet Take 1,000 Units by mouth daily.    cycloSPORINE (RESTASIS) 0.05 % ophthalmic emulsion Place 1 drop into both eyes 2 (two) times daily.   denosumab (PROLIA) 60 MG/ML SOSY injection Inject 60 mg into the skin every 6 (six) months.   dexlansoprazole (DEXILANT) 60 MG capsule Take 1 capsule (60 mg total) by mouth daily.   Dextran 70-Hypromellose 0.1-0.3 % SOLN Place 1 drop into both eyes 4 (four) times daily. (Patient not taking: Reported on 06/21/2021)   diphenoxylate-atropine (LOMOTIL) 2.5-0.025 MG tablet TAKE 1 TABLET BY MOUTH 4 TIMES DAILY AS NEEDED FOR DIARRHEA OR  LOOSE  STOOLS (Patient not taking: Reported on  06/21/2021)   DULoxetine (CYMBALTA) 30 MG capsule TAKE 3 CAPSULES BY MOUTH ONCE DAILY (Patient taking differently: Take 90 mg by mouth daily.)   estradiol (ESTRACE) 2 MG tablet Take 1 tablet (2 mg total) by mouth daily.   famotidine (PEPCID) 20 MG tablet Take 20 mg by mouth daily as needed for heartburn or indigestion.   GATTEX 5 MG KIT Inject 1.5 mg into the skin daily.   HYDROmorphone (DILAUDID) 4 MG tablet Take 4 mg by mouth in the morning, at noon, in the evening, and at bedtime.   levETIRAcetam (KEPPRA) 500 MG tablet Take 500 mg by mouth 2 (two) times daily.   lipase/protease/amylase (CREON) 36000 UNITS CPEP capsule Take 1 capsule (36,000 Units total) by mouth 3 (three) times daily as needed (with meals for digestion). (Patient taking differently: Take 36,000 Units by mouth See admin instructions. 36000 units with meals)   loperamide (IMODIUM) 2 MG capsule TAKE 1 CAPSULE BY MOUTH AS NEEDED FOR DIARRHEA OR LOOSE STOOLS (Patient not taking: Reported on 06/21/2021)   Menthol, Topical Analgesic, (ICY HOT BACK EX) Apply 1 application topically daily as needed (back pain). (Patient not taking: Reported on 06/21/2021)   methadone (DOLOPHINE) 5 MG tablet Take 1 tablet (5 mg total) by mouth 5 (five) times daily.   methocarbamol (ROBAXIN) 500 MG tablet Take 1 tablet (500 mg total) by mouth in the morning and at bedtime.   Multiple Vitamins-Minerals (MULTIVITAMIN ADULT PO) Take 1 tablet by  mouth daily.   NARCAN 4 MG/0.1ML LIQD nasal spray kit Place 1 spray into the nose once as needed (overdose).   PRESCRIPTION MEDICATION Inject 1 each into the vein daily. Home TPN . [facility] . 1 bag for 12 hours. [phone]   Probiotic Product (PROBIOTIC-10 PO) Take 1 capsule by mouth daily.    promethazine (PHENERGAN) 25 MG tablet TAKE 1 TABLET BY MOUTH EVERY 6 HOURS AS NEEDED FOR NAUSEA FOR VOMITING (Patient taking differently: Take 25 mg by mouth every 6 (six) hours as needed for nausea.)    sodium chloride 0.9 % infusion Inject 1 mL into the vein daily as needed (flush).   sucralfate (CARAFATE) 1 GM/10ML suspension Take 10 mLs (1 g total) by mouth 4 (four) times daily -  with meals and at bedtime.   Trace Minerals Cu-Mn-Se-Zn (TRALEMENT IV) Inject 1 mL into the vein See admin instructions. Used in TPN bag 4 times weekly   vitamin B-12 (CYANOCOBALAMIN) 1000 MCG tablet Take 1,000 mcg by mouth daily.    carvedilol (COREG) 25 MG tablet Take 1 tablet (25 mg total) by mouth 2 (two) times daily with a meal.   hydrALAZINE (APRESOLINE) 100 MG tablet Take 1 tablet (100 mg total) by mouth 3 (three) times daily.     Patient Active Problem List   Diagnosis Date Noted   SIRS (systemic inflammatory response syndrome) ([place]) 06/20/2021   Chronic idiopathic constipation    Left shoulder pain 05/23/2021   Elevated LFTs    Sepsis ([place]) 05/06/2021   Acidosis 04/08/2021   Colitis 04/07/2021   Intractable nausea and vomiting 03/11/2021   Thoracic discitis 02/10/2021   Pressure injury of skin 12/18/2020   Bacteremia 12/17/2020   Seizures ([place]) 12/17/2020   Drug-seeking behavior 12/16/2020   Seizure ([place]) 12/15/2020   CKD (chronic kidney disease) stage 3, GFR 30-59 ml/min ([place]) 12/06/2020   COVID-19 virus infection 12/06/2020   Protein calorie malnutrition ([place]) 12/06/2020   Palpitations 11/22/2020   PAC (premature atrial contraction) 11/22/2020   History of central line-associated bloodstream infection ([facility]) 10/21/2020   Nausea & vomiting 10/21/2020   Choledocholithiasis (sludge) s/p ERCP 10/2019 10/21/2020   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) 03/05/2020   Central line infection    Elevated liver enzymes 01/02/2020   Cholangitis    Anasarca 10/28/2019   Acute kidney injury superimposed on chronic kidney disease ([place]) 10/28/2019   Falls 10/28/2019   Malnutrition ([place])    Vitamin B12 deficiency    GERD (gastroesophageal reflux disease)    Chronic pain syndrome    Hypokalemia due to excessive gastrointestinal loss of potassium 10/13/2019   Fever    Hypokalemia 10/12/2019   Chronic diarrhea 10/12/2019   Dysuria 10/12/2019   Bilateral lower extremity edema 10/12/2019   AKI (acute kidney injury) ([place]) 10/12/2019   Fungemia 08/27/2019   IDA (iron deficiency anemia) 11/03/2018   Dilation of biliary tract 08/28/2018   Severe diarrhea 03/07/2018   LFTs abnormal 01/27/2018   GI tract obstruction ([place]) 01/27/2018   Gram-negative bacteremia 09/13/2017   HTN (hypertension), benign 12/02/2016   Intractable pain  12/02/2016   Anemia 12/02/2016   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   Avascular necrosis ([place]) 06/14/2015   Polyarthralgia 05/03/2015   On total parenteral nutrition (TPN) 12/30/2014   Osteoporosis 12/24/2014   Low back pain 12/16/2014   Vitamin D deficiency 12/16/2014   Short gut syndrome 07/15/2014   History of colonic diverticulitis 2014   Depression 07/24/2012   Gram-positive bacteremia 07/24/2012   Small bowel obstruction due to adhesions ([place]) 07/24/2012   Diarrhea 12/13/2011   Neuralgia and neuritis 06/01/2011   Myalgia and myositis 08/12/2003   Crohn's disease of small & large intestine with SGS 1984     Health Maintenance Due  Topic Date Due   Zoster Vaccines- Shingrix (1 of 2) Never done   Pneumococcal Vaccine 48-79 Years old (3 - PPSV23 or PCV20) 07/11/2016    Sochx: no drinking or smoking  Review of Systems 12 point ros is negative except for left shoulder pain, occ chills. Diarrhea(Baseline)  Physical Exam   BP (!) 147/72   Pulse 96   Temp  98.8 F (37.1 C) (Oral)   Wt 131 lb (59.4 kg)   BMI 19.92 kg/m   Physical Exam  Constitutional:  oriented to person, place, and time. appears well-developed and well-nourished. Appears in mild distress HENT: /AT, PERRLA, no scleral icterus Mouth/Throat: Oropharynx is clear and moist. No oropharyngeal exudate.  Cardiovascular: Normal rate, regular rhythm and normal heart sounds. Exam reveals no gallop and no friction rub.  No murmur heard.  Pulmonary/Chest: Effort normal and breath sounds normal. No respiratory distress.  has no wheezes.  Ext: limited ROM on exam of left shoulder due to pain Lymphadenopathy: no cervical adenopathy. No axillary adenopathy Neurological: alert and oriented to person, place, and time.  Skin: Skin is warm and dry. No rash noted. No erythema.  Psychiatric: a normal mood and affect.  behavior is normal.   CBC Lab Results  Component Value Date   WBC 2.5 (L) 06/22/2021   RBC 2.40 (L) 06/22/2021   HGB 7.3 (L) 06/22/2021   HCT 23.5 (L) 06/22/2021   PLT 129 (L) 06/22/2021   MCV 97.9 06/22/2021   MCH 30.4 06/22/2021   MCHC 31.1 06/22/2021   RDW 14.5 06/22/2021   LYMPHSABS 0.9 06/22/2021   MONOABS 0.4 06/22/2021   EOSABS 0.1 06/22/2021    BMET Lab Results  Component Value Date   NA 139 06/22/2021   K 3.6 06/22/2021   CL 112 (H) 06/22/2021   CO2 23 06/22/2021   GLUCOSE 117 (H) 06/22/2021   BUN 21 (H) 06/22/2021   CREATININE 1.58 (H) 06/22/2021   CALCIUM 8.6 (L) 06/22/2021   GFRNONAA 37 (L) 06/22/2021   GFRAA 55 (L) 08/12/2020      Assessment and Plan  Left shoulder pain = recommend to continue using non-opiate treatment modalities. Can do lidocaine patch. Icy-hot rub to see if any improvement. Recommend to follow up with orthopedics next week  Hx of discitis = completed treatment for culture negative thoracolumbar discitis. Monitor off of treatment  Hx of femoral line = continue with weekly dressing changes. No recent bpicc line associated  bacteremia luckily.  Rtc in 3 months

## 2021-06-22 NOTE — Progress Notes (Signed)
PHARMACY - PHYSICIAN COMMUNICATION CRITICAL VALUE ALERT - BLOOD CULTURE IDENTIFICATION (BCID)  Caitlin Peters is an 60 y.o. female who presented to [facility] on 06/19/2021 with a chief complaint of fever and chills  Assessment:  Pt on chronic TPN, admitted with sepsis and concern for bacteremia. Broad spectrum antibiotics initiated.   7/25 BCx: 1/2 GNR; nothing detected on BCID panel  Name of physician (or Provider) Contacted: Dr. Alfredia Ferguson, Dr. Linus Salmons  Current antibiotics: Vancomycin, ceftriaxone, metronidazole  Changes to prescribed antibiotics recommended: No changes at this time. ID has been consulted - await ID antibiotic recommendations  Results for orders placed or performed during the hospital encounter of 06/19/21  Blood Culture ID Panel (Reflexed) (Collected: 06/19/2021  9:45 PM)  Result Value Ref Range   Enterococcus faecalis NOT DETECTED NOT DETECTED   Enterococcus Faecium NOT DETECTED NOT DETECTED   Listeria monocytogenes NOT DETECTED NOT DETECTED   Staphylococcus species NOT DETECTED NOT DETECTED   Staphylococcus aureus (BCID) NOT DETECTED NOT DETECTED   Staphylococcus epidermidis NOT DETECTED NOT DETECTED   Staphylococcus lugdunensis NOT DETECTED NOT DETECTED   Streptococcus species NOT DETECTED NOT DETECTED   Streptococcus agalactiae NOT DETECTED NOT DETECTED   Streptococcus pneumoniae NOT DETECTED NOT DETECTED   Streptococcus pyogenes NOT DETECTED NOT DETECTED   A.calcoaceticus-baumannii NOT DETECTED NOT DETECTED   Bacteroides fragilis NOT DETECTED NOT DETECTED   Enterobacterales NOT DETECTED NOT DETECTED   Enterobacter cloacae complex NOT DETECTED NOT DETECTED   Escherichia coli NOT DETECTED NOT DETECTED   Klebsiella aerogenes NOT DETECTED NOT DETECTED   Klebsiella oxytoca NOT DETECTED NOT DETECTED   Klebsiella pneumoniae NOT DETECTED NOT DETECTED   Proteus species NOT DETECTED NOT DETECTED   Salmonella species NOT DETECTED NOT DETECTED   Serratia marcescens NOT  DETECTED NOT DETECTED   Haemophilus influenzae NOT DETECTED NOT DETECTED   Neisseria meningitidis NOT DETECTED NOT DETECTED   Pseudomonas aeruginosa NOT DETECTED NOT DETECTED   Stenotrophomonas maltophilia NOT DETECTED NOT DETECTED   Candida albicans NOT DETECTED NOT DETECTED   Candida auris NOT DETECTED NOT DETECTED   Candida glabrata NOT DETECTED NOT DETECTED   Candida krusei NOT DETECTED NOT DETECTED   Candida parapsilosis NOT DETECTED NOT DETECTED   Candida tropicalis NOT DETECTED NOT DETECTED   Cryptococcus neoformans/gattii NOT DETECTED NOT DETECTED    Lenis Noon, PharmD 06/22/2021  11:50 AM

## 2021-06-22 NOTE — Plan of Care (Signed)
  Problem: Clinical Measurements: Goal: Respiratory complications will improve Outcome: Completed/Met

## 2021-06-22 NOTE — Progress Notes (Signed)
Report given to Renaldo Fiddler, RN. Patient transferred to room 1406 with all belongings.

## 2021-06-22 NOTE — Progress Notes (Signed)
PROGRESS NOTE    Caitlin Peters  TML:[id number] DOB: Mar 04, 1961 DOA: 06/19/2021 PCP: [name], [name], MD   Brief Narrative:  The patient is a 60 year old African-American female with a past medical history significant for but not limited to short gut syndrome requiring TPN, history of Crohn's disease with multiple surgeries, chronic pain syndrome, history of chronic PICC line for IV access as well as other comorbidities including chronic pancreatitis who presented to the ED on 06/19/2021 with fever and chills.  She reports that she has not felt well since 05/29/2021 when she was hospitalized late in June 2022.  Since leaving the hospital she has noticed pain in her left shoulder and arm.  She states that fever and chills began the day before admission and has been intermittent.  She reported diaphoresis at night and her fever at home was as high as 102.  She also admits that chronic abdominal pain located upper abdomen on the left side that radiates to the back that is 7 out of 10 and characterized as cramping and sharp as well as dull at times.  She states that her abdominal pain is alleviated by nothing and exacerbated by eating.  She had fevers and chills that were alleviated by nothing and exacerbated by nothing.  Associated symptoms include nausea and vomiting intermittently.  She states that she has chronic diarrhea more like Jell-O consistency but did not notice any blood and did not have any hematuria or dysuria.  She presented the ED and she is found to be febrile at 108 with a pulse rate of 104.  She was leukopenic and did have abnormal LFTs which are now improving.  Urinalysis showed no leukocytes or nitrites and cultures were drawn however.  She was placed on IV antibiotics with IV vancomycin and IV ceftriaxone  Assessment & Plan:   Principal Problem:   SIRS (systemic inflammatory response syndrome) ([name]) Active Problems:   Crohn's disease of small & large intestine with SGS    Short gut syndrome   GERD (gastroesophageal reflux disease)   Chronic pain syndrome   On total parenteral nutrition (TPN)   LFTs abnormal   Pancytopenia ([name])  Sepsis 2/2 to GNR Bacteremia, poA  -Present on admission and in the setting of GNR Bacteremia. Source: No known source of infection, but could have a PICC line infection or an abdominal infection.  -She does have a history of PICC line infections previously -Sepsis order set. Cultures ordered. And Blood Cx x2 showing GNR but Sensitivities pending  -Continued IV antibiotics: IV ceftriaxone, IV metronidazole, and IV vancomycin but now that she has a GNR bacteremia with no identification ID was consulted and changed her to IV Cefepime pending Identification of Organism -She was given IV fluids to provide volume and remains on Prosource +30 mL p.o. twice daily, normal saline at 60 mL/h -Monitor for signs of volume depletion; monitor blood pressure carefully.  -Close monitoring of temperature curve and WBC; WBC has gone from 2.9 -> 3.8 -> 4.6 -> 3.5 -> 2.5 -Initial lactic acid was normal and repeat was also normal  -Given her Gram Negative Bacteremia ID feels no absolute indication for line exchange at this time -Appreciate ID Recc's   Pancytopenia [facility]) -May be related to SIRS. -WBC was 3.5, hemoglobin/hematocrit was 7.4/23.5, and platelet count was 114; -Now WBC is 2.5, Hgb/Hct is 7.3/23.5 and Platelet Count is 129 -Continue monitor and trend and repeat CBC in a.m.   Short gut syndrome -With chronic TPN use.   -  Holding home TPN given Bacteremia.  Give IV fluids.   Abnormal LFTs, improving  In the setting of TPN and Bacteremic infection -Patient's AST on admission was 544 is now trended down to 40 and patient's ALT was 365 and has now trended down to 1937 -Monitor and trend LFTs and if necessary will obtain a right upper quadrant ultrasound as well as an acute hepatitis panel  GERD (gastroesophageal reflux disease) -Continue  home medications with pantoprazole 40 mg p.o. daily as well as famotidine 20 mg p.o. daily as needed heartburn and indigestion -Also continue sucralfate 1 g p.o. 3 times daily   Chronic pain syndrome -Continue home medication with methadone 5 mg p.o. 5 times daily as well as p.o. hydromorphone 4 mg every 4 hours as needed severe pain as well as IV hydromorphone 1 mg every 2 hours as needed severe pain -Continue with methocarbamol 500 g p.o. twice daily   Crohn's disease of the small and large intestine with short gut syndrome  -Continued home TPN but held given GNR bacteremia  -Continue with home budesonide 9 mg p.o. daily and with teduglutide 1.5 mg subcu daily for Short Gut -Followed by gastroenterology at [facility]  Left shoulder pain -Had an MRI towards the end of June and did not show any acute findings and is seeing her primary care provider -She has been referred to orthopedic surgery and has appointment August 1.  AKI on chronic kidney disease stage IIIa Metabolic acidosis -Stable and improved.  Patient's BUNs/creatinine went from 45/1.49 and trended all the way down to 24/1.34 yesterday but this AM is 21/1.58 Continue with IV fluid hydration as above -Patient had a small metabolic acidosis with a CO2 21, anion gap of 11, chloride level of 108; Now CO2 is 23, AG is 4, and Chloride Level is 112 -Avoid further nephrotoxic medications, contrast dyes, hypotension and renally dose medications Repeat CMP in a.m.  Depression and Anxiety -Continue with Bupropion ER 20 g p.o. daily and duloxetine 90 mg p.o. daily  Hypertension -Continue to monitor blood pressures per protocol  -Continue with amlodipine 10 mg p.o. daily as well as carvedilol 25 mg p.o. twice daily -Continue to Monitor BP per Protocol -Last BP was 139/70  History of recurrent pancreatitis -Continue with Creon 36,000 units p.o. 3 times daily with meals  DVT prophylaxis: Enoxaparin 40 mg sq q24h Code Status: FULL CODE   Family Communication: No family present at bedside Disposition Plan: Pending further clinical improvement  Status is: Inpatient  Remains inpatient appropriate because:Unsafe d/c plan, IV treatments appropriate due to intensity of illness or inability to take PO, and Inpatient level of care appropriate due to severity of illness  Dispo:  Patient From: Home  Planned Disposition: Home with Health Care Svc  Medically stable for discharge: No    Consultants:  Infectious Diseases   Procedures: None  Antimicrobials:  Anti-infectives (From admission, onward)    Start     Dose/Rate Route Frequency Ordered Stop   06/22/21 1300  ceFEPIme (MAXIPIME) 2 g in sodium chloride 0.9 % 100 mL IVPB        2 g 200 mL/hr over 30 Minutes Intravenous Every 12 hours 06/22/21 1213     06/20/21 2300  cefTRIAXone (ROCEPHIN) 2 g in sodium chloride 0.9 % 100 mL IVPB  Status:  Discontinued        2 g 200 mL/hr over 30 Minutes Intravenous Every 24 hours 06/20/21 0354 06/22/21 1213   06/20/21 2200  vancomycin (VANCOREADY) IVPB  750 mg/150 mL  Status:  Discontinued        750 mg 150 mL/hr over 60 Minutes Intravenous Every 24 hours 06/20/21 0152 06/22/21 1213   06/20/21 0400  metroNIDAZOLE (FLAGYL) IVPB 500 mg  Status:  Discontinued        500 mg 100 mL/hr over 60 Minutes Intravenous Every 8 hours 06/20/21 0354 06/22/21 1213   06/19/21 2345  cefTRIAXone (ROCEPHIN) 1 g in sodium chloride 0.9 % 100 mL IVPB        1 g 200 mL/hr over 30 Minutes Intravenous  Once 06/19/21 2341 06/20/21 0151   06/19/21 2300  cefTRIAXone (ROCEPHIN) 1 g in sodium chloride 0.9 % 100 mL IVPB        1 g 200 mL/hr over 30 Minutes Intravenous  Once 06/19/21 2253 06/20/21 0011   06/19/21 2300  vancomycin (VANCOCIN) IVPB 1000 mg/200 mL premix        1,000 mg 200 mL/hr over 60 Minutes Intravenous  Once 06/19/21 2253 06/20/21 0113        Subjective: Seen and examined at bedside and thinks she is doing a little bit better but still have  some mild abdominal discomfort and also having some left shoulder pain.  Was happy that her labs were improving but then subsequently found out that she has bacteremia.  No lightheadedness or dizziness.  No other concerns or complaints at this time.  Overall she feels better since coming in.   Objective: Vitals:   06/21/21 1633 06/21/21 2102 06/22/21 0540 06/22/21 1223  BP: (!) 128/58 135/68 134/61 139/70  Pulse:  68 67 75  Resp:  16 16 18   Temp:  98.1 F (36.7 C) 98.1 F (36.7 C) 98 F (36.7 C)  TempSrc:   Oral   SpO2:  99% 97% 99%  Weight:   67.5 kg   Height:        Intake/Output Summary (Last 24 hours) at 06/22/2021 1450 Last data filed at 06/22/2021 0400 Gross per 24 hour  Intake 1725.19 ml  Output 600 ml  Net 1125.19 ml    Filed Weights   06/19/21 2122 06/21/21 0353 06/22/21 0540  Weight: 59 kg 64.8 kg 67.5 kg   Examination: Physical Exam:  Constitutional: WN/WD chronically ill appearing AAF in NAD and appears calm and comfortable Eyes: Lids and conjunctivae normal, sclerae anicteric  ENMT: External Ears, Nose appear normal. Grossly normal hearing.  Neck: Appears normal, supple, no cervical masses, normal ROM, no appreciable thyromegaly; no JVD Respiratory: Diminished to auscultation bilaterally, no wheezing, rales, rhonchi or crackles. Normal respiratory effort and patient is not tachypenic. No accessory muscle use. Unlabored breathing  Cardiovascular: RRR, no murmurs / rubs / gallops. S1 and S2 auscultated. No extremity edema.  Abdomen: Soft, non-tender, Distended 2/2 body habitus and has a midline abdominal scar from prior surgeries. No masses palpated. No appreciable hepatosplenomegaly. Bowel sounds positive.  GU: Deferred. Musculoskeletal: No clubbing / cyanosis of digits/nails. Normal strength and muscle tone.  Skin: No rashes, lesions, ulcers on a limited skin evaluation. No induration; Warm and dry.  Neurologic: CN 2-12 grossly intact with no focal deficits.  Romberg sign and cerebellar reflexes not assessed.  Psychiatric: Normal judgment and insight. Alert and oriented x 3. Normal mood and appropriate affect.   Data Reviewed: I have personally reviewed following labs and imaging studies  CBC: Recent Labs  Lab 06/19/21 2145 06/19/21 2330 06/20/21 0454 06/21/21 0517 06/22/21 0320  WBC 2.9* 3.8* 4.6 3.5* 2.5*  NEUTROABS 2.4 3.2  --  2.0 1.2*  HGB 7.6* 7.2* 7.4* 7.4* 7.3*  HCT 23.2* 21.8* 23.2* 23.5* 23.5*  MCV 92.8 92.4 93.5 97.5 97.9  PLT 111* 110* 117* 114* 129*    Basic Metabolic Panel: Recent Labs  Lab 06/19/21 2145 06/20/21 0454 06/21/21 0517 06/22/21 0320  NA 136 139 140 139  K 3.6 3.9 3.9 3.6  CL 105 111 108 112*  CO2 22 24 21* 23  GLUCOSE 92 113* 88 117*  BUN 45* 37* 24* 21*  CREATININE 1.49* 1.46* 1.34* 1.58*  CALCIUM 8.4* 8.4* 8.9 8.6*  MG  --   --  1.9 1.9  PHOS  --   --  4.0 3.6    GFR: Estimated Creatinine Clearance: 38.2 mL/min (A) (by C-G formula based on SCr of 1.58 mg/dL (H)). Liver Function Tests: Recent Labs  Lab 06/19/21 2145 06/20/21 0454 06/21/21 0517 06/22/21 0320  AST 544* 282* 91* 40  ALT 365* 313* 197* 137*  ALKPHOS 244* 230* 181* 167*  BILITOT 1.0 1.0 0.6 0.5  PROT 5.7* 5.7* 5.5* 5.6*  ALBUMIN 2.6* 2.5* 2.4* 2.3*    Recent Labs  Lab 06/19/21 2330  LIPASE 30    No results for input(s): AMMONIA in the last 168 hours. Coagulation Profile: Recent Labs  Lab 06/19/21 2145  INR 1.0    Cardiac Enzymes: No results for input(s): CKTOTAL, CKMB, CKMBINDEX, TROPONINI in the last 168 hours. BNP (last 3 results) No results for input(s): PROBNP in the last 8760 hours. HbA1C: No results for input(s): HGBA1C in the last 72 hours. CBG: Recent Labs  Lab 06/21/21 1801 06/21/21 2349 06/22/21 0515 06/22/21 1201  GLUCAP 93 110* 113* 121*    Lipid Profile: Recent Labs    06/21/21 0517  TRIG 91    Thyroid Function Tests: No results for input(s): TSH, T4TOTAL, FREET4, T3FREE,  THYROIDAB in the last 72 hours. Anemia Panel: No results for input(s): VITAMINB12, FOLATE, FERRITIN, TIBC, IRON, RETICCTPCT in the last 72 hours. Sepsis Labs: Recent Labs  Lab 06/19/21 2145 06/19/21 2330 06/20/21 0454  LATICACIDVEN 0.6 0.6 0.5     Recent Results (from the past 240 hour(s))  Urine Culture     Status: None   Collection Time: 06/12/21  8:15 PM   Specimen: Urine, Clean Catch  Result Value Ref Range Status   Specimen Description   Final    URINE, CLEAN CATCH Performed at [facility], [place] [address], [place], [place] [address]    Special Requests   Final    NONE Performed at [facility], [place] [address], [place], [place] [address]    Culture   Final    NO GROWTH Performed at [facility], [place] [address], [place], [place] [address]    Report Status 06/14/2021 FINAL  Final  Resp Panel by RT-PCR (Flu A&B, Covid) Nasopharyngeal Swab     Status: None   Collection Time: 06/19/21  9:20 PM   Specimen: Nasopharyngeal Swab; Nasopharyngeal(NP) swabs in vial transport medium  Result Value Ref Range Status   SARS Coronavirus 2 by RT PCR NEGATIVE NEGATIVE Final    Comment: (NOTE) SARS-CoV-2 target nucleic acids are NOT DETECTED.  The SARS-CoV-2 RNA is generally detectable in upper respiratory specimens during the acute phase of infection. The lowest concentration of SARS-CoV-2 viral copies this assay can detect is 138 copies/mL. A negative result does not preclude SARS-Cov-2 infection and should not be used as the sole basis for treatment or other patient management decisions. A  negative result may occur with  improper specimen collection/handling, submission of specimen other than nasopharyngeal swab, presence of viral mutation(s) within the areas targeted by this assay, and inadequate number of viral copies(<138 copies/mL). A negative result must be combined with clinical observations, patient history, and  epidemiological information. The expected result is Negative.  Fact Sheet for Patients:  EntrepreneurPulse.com.au  Fact Sheet for Healthcare Providers:  IncredibleEmployment.be  This test is no t yet approved or cleared by the [place] FDA and  has been authorized for detection and/or diagnosis of SARS-CoV-2 by FDA under an Emergency Use Authorization (EUA). This EUA will remain  in effect (meaning this test can be used) for the duration of the COVID-19 declaration under Section 564(b)(1) of the Act, 21 U.S.C.section 360bbb-3(b)(1), unless the authorization is terminated  or revoked sooner.       Influenza A by PCR NEGATIVE NEGATIVE Final   Influenza B by PCR NEGATIVE NEGATIVE Final    Comment: (NOTE) The Xpert Xpress SARS-CoV-2/FLU/RSV plus assay is intended as an aid in the diagnosis of influenza from Nasopharyngeal swab specimens and should not be used as a sole basis for treatment. Nasal washings and aspirates are unacceptable for Xpert Xpress SARS-CoV-2/FLU/RSV testing.  Fact Sheet for Patients: EntrepreneurPulse.com.au  Fact Sheet for Healthcare Providers: IncredibleEmployment.be  This test is not yet approved or cleared by the [place] FDA and has been authorized for detection and/or diagnosis of SARS-CoV-2 by FDA under an Emergency Use Authorization (EUA). This EUA will remain in effect (meaning this test can be used) for the duration of the COVID-19 declaration under Section 564(b)(1) of the Act, 21 U.S.C. section 360bbb-3(b)(1), unless the authorization is terminated or revoked.  Performed at [facility], [place] [address], [place], [place] [address]   Urine Culture     Status: Abnormal   Collection Time: 06/19/21  9:45 PM   Specimen: In/Out Cath Urine  Result Value Ref Range Status   Specimen Description   Final    IN/OUT CATH URINE Performed at [place] [address], [place], [place] [address]    Special Requests   Final    NONE Performed at [facility], [place] [address] [place], [place] [address]    Culture (A)  Final    4,000 COLONIES/mL STAPHYLOCOCCUS EPIDERMIDIS 10,000 COLONIES/mL LACTOBACILLUS SPECIES Standardized susceptibility testing for this organism is not available. Performed at [facility], [place] [address], [place], [place] [address]    Report Status 06/21/2021 FINAL  Final  Blood Culture (routine x 2)     Status: None (Preliminary result)   Collection Time: 06/19/21  9:45 PM   Specimen: BLOOD  Result Value Ref Range Status   Specimen Description   Final    BLOOD RIGHT ANTECUBITAL Performed at [place] [address], [place], [place] [address]    Special Requests   Final    BOTTLES DRAWN AEROBIC AND ANAEROBIC Blood Culture adequate volume Performed at [place] [address] [place],  [address]    Culture  Setup Time   Final    GRAM NEGATIVE RODS AEROBIC BOTTLE ONLY CRITICAL RESULT CALLED TO, READ BACK BY AND VERIFIED WITH: PHARMD M TUCKER [phone] [id number] FCP CRITICAL RESULT CALLED TO, READ BACK BY AND VERIFIED WITH: pharmd Dillard [place] 06/22/21@11 :25 by tw    Culture   Final    GRAM NEGATIVE RODS CRITICAL RESULT CALLED TO, READ BACK BY AND VERIFIED  WITH: PHARMD Kittie Plater [phone] FCP CULTURE REINCUBATED FOR BETTER GROWTH Performed at [facility], [place] [address] [place], [place] [address]    Report Status PENDING  Incomplete  Blood Culture ID Panel (Reflexed)     Status: None   Collection Time: 06/19/21  9:45 PM  Result Value Ref Range Status   Enterococcus faecalis NOT DETECTED NOT DETECTED Final   Enterococcus Faecium NOT DETECTED NOT DETECTED Final   Listeria monocytogenes NOT DETECTED NOT DETECTED Final   Staphylococcus species NOT DETECTED NOT DETECTED Final   Staphylococcus aureus (BCID) NOT  DETECTED NOT DETECTED Final   Staphylococcus epidermidis NOT DETECTED NOT DETECTED Final   Staphylococcus lugdunensis NOT DETECTED NOT DETECTED Final   Streptococcus species NOT DETECTED NOT DETECTED Final   Streptococcus agalactiae NOT DETECTED NOT DETECTED Final   Streptococcus pneumoniae NOT DETECTED NOT DETECTED Final   Streptococcus pyogenes NOT DETECTED NOT DETECTED Final   A.calcoaceticus-baumannii NOT DETECTED NOT DETECTED Final   Bacteroides fragilis NOT DETECTED NOT DETECTED Final   Enterobacterales NOT DETECTED NOT DETECTED Final   Enterobacter cloacae complex NOT DETECTED NOT DETECTED Final   Escherichia coli NOT DETECTED NOT DETECTED Final   Klebsiella aerogenes NOT DETECTED NOT DETECTED Final   Klebsiella oxytoca NOT DETECTED NOT DETECTED Final   Klebsiella pneumoniae NOT DETECTED NOT DETECTED Final   Proteus species NOT DETECTED NOT DETECTED Final   Salmonella species NOT DETECTED NOT DETECTED Final   Serratia marcescens NOT DETECTED NOT DETECTED Final   Haemophilus influenzae NOT DETECTED NOT DETECTED Final   Neisseria meningitidis NOT DETECTED NOT DETECTED Final   Pseudomonas aeruginosa NOT DETECTED NOT DETECTED Final   Stenotrophomonas maltophilia NOT DETECTED NOT DETECTED Final   Candida albicans NOT DETECTED NOT DETECTED Final   Candida auris NOT DETECTED NOT DETECTED Final   Candida glabrata NOT DETECTED NOT DETECTED Final   Candida krusei NOT DETECTED NOT DETECTED Final   Candida parapsilosis NOT DETECTED NOT DETECTED Final   Candida tropicalis NOT DETECTED NOT DETECTED Final   Cryptococcus neoformans/gattii NOT DETECTED NOT DETECTED Final    Comment: Performed at [facility], [address] [place], [place] [address]  MRSA Next Gen by PCR, Nasal     Status: None   Collection Time: 06/20/21  6:27 PM   Specimen: Nasal Mucosa; Nasal Swab  Result Value Ref Range Status   MRSA by PCR Next Gen NOT DETECTED NOT DETECTED Final    Comment: (NOTE) The  GeneXpert MRSA Assay (FDA approved for NASAL specimens only), is one component of a comprehensive MRSA colonization surveillance program. It is not intended to diagnose MRSA infection nor to guide or monitor treatment for MRSA infections. Test performance is not FDA approved in patients less than 29 years old. Performed at [facility], [place] [address], [place], [place] [address]     RN Pressure Injury Documentation:     Estimated body mass index is 22.62 kg/m as calculated from the following:   Height as of this encounter: 5' 8"  (1.727 m).   Weight as of this encounter: 67.5 kg.  Malnutrition Type:  Nutrition Problem: Increased nutrient needs Etiology: acute illness, chronic illness   Malnutrition Characteristics:  Signs/Symptoms: estimated needs   Nutrition Interventions:  Interventions: Prostat, TPN, MVI     Radiology Studies: No results found.  Scheduled Meds:  (feeding supplement) PROSource Plus  30 mL Oral BID BM   acidophilus  1 capsule Oral Daily   amLODipine  10  mg Oral Daily   budesonide  9 mg Oral Daily   buPROPion ER  200 mg Oral Daily   calcium carbonate  500 mg Oral Daily   carvedilol  25 mg Oral BID WC   Chlorhexidine Gluconate Cloth  6 each Topical Daily   cholecalciferol  1,000 Units Oral Daily   cycloSPORINE  1 drop Both Eyes BID   DULoxetine  90 mg Oral Daily   enoxaparin (LOVENOX) injection  40 mg Subcutaneous Q24H   estradiol  2 mg Oral Daily   hydrALAZINE  100 mg Oral TID   insulin aspart  0-9 Units Subcutaneous Q6H   levETIRAcetam  500 mg Oral BID   lipase/protease/amylase  36,000 Units Oral TID WC   methadone  5 mg Oral 5 X Daily   methocarbamol  500 mg Oral BID   multivitamin with minerals   Oral Daily   pantoprazole  40 mg Oral Daily   polyvinyl alcohol  1 drop Both Eyes QID   sodium chloride flush  10-40 mL Intracatheter Q12H   sucralfate  1 g Oral TID WC & HS   Teduglutide (rDNA)  1.5 mg Subcutaneous Daily    vitamin B-12  1,000 mcg Oral Daily   Continuous Infusions:  sodium chloride     sodium chloride 60 mL/hr at 06/21/21 2107   ceFEPime (MAXIPIME) IV      LOS: 2 days   Kerney Elbe, DO [facility] PAGER is on AMION  If 7PM-7AM, please contact night-coverage [email or web address]

## 2021-06-22 NOTE — Consult Note (Signed)
[facility] Infectious Disease       Reason for Consult:bacteremia    Referring Physician: Dr. Alfredia Ferguson  Principal Problem:   SIRS (systemic inflammatory response syndrome) ([name]) Active Problems:   Crohn's disease of small & large intestine with SGS   Short gut syndrome   GERD (gastroesophageal reflux disease)   Chronic pain syndrome   On total parenteral nutrition (TPN)   LFTs abnormal   Pancytopenia ([name])    (feeding supplement) PROSource Plus  30 mL Oral BID BM   acidophilus  1 capsule Oral Daily   amLODipine  10 mg Oral Daily   budesonide  9 mg Oral Daily   buPROPion ER  200 mg Oral Daily   calcium carbonate  500 mg Oral Daily   carvedilol  25 mg Oral BID WC   Chlorhexidine Gluconate Cloth  6 each Topical Daily   cholecalciferol  1,000 Units Oral Daily   cycloSPORINE  1 drop Both Eyes BID   DULoxetine  90 mg Oral Daily   enoxaparin (LOVENOX) injection  40 mg Subcutaneous Q24H   estradiol  2 mg Oral Daily   hydrALAZINE  100 mg Oral TID   insulin aspart  0-9 Units Subcutaneous Q6H   levETIRAcetam  500 mg Oral BID   lipase/protease/amylase  36,000 Units Oral TID WC   methadone  5 mg Oral 5 X Daily   methocarbamol  500 mg Oral BID   multivitamin with minerals   Oral Daily   pantoprazole  40 mg Oral Daily   polyvinyl alcohol  1 drop Both Eyes QID   sodium chloride flush  10-40 mL Intracatheter Q12H   sucralfate  1 g Oral TID WC & HS   Teduglutide (rDNA)  1.5 mg Subcutaneous Daily   vitamin B-12  1,000 mcg Oral Daily    Recommendations:  Cefepime for now pending ID of organism No absolute indication for line exchange at this time  Assessment: She has bacteremia and I suspect a line source.  With a GNR, will change to cefepime pending ID.     Antibiotics: Ceftriaxone and metronidazole  HPI: Caitlin Peters is a 60 y.o. female with a history of Crohn's disease and resultant short gut syndrome from multiple surgeries, a chronic PICC line in the femoral  region felt to be her last line access who came in feeling poorly.  She has a history of infections with the line present including Staph epidermidis, acinetobacter, Stenotrophomonas and s/p multiple treatments and line exchanges.  Tmax of 100.8 here, some leukopenia but no chills.  Feels well today.  Blood culture with GNR and BCID with no findings.    Review of Systems:  Constitutional: negative for fevers and chills Gastrointestinal: negative for nausea and diarrhea Integument/breast: negative for rash All other systems reviewed and are negative    Past Medical History:  Diagnosis Date   Acute pancreatitis 04/13/2020   Anasarca 10/2019   AVN (avascular necrosis of bone) ([name])    Cataract    Choledocholithiasis (sludge) s/p ERCP 10/2019 10/21/2020   Chronic pain syndrome    CKD (chronic kidney disease), stage III ([place])    Crohn disease ([place])    Crohn's disease of small & large intestine with Wallace   Caitlin Peters is a 60 year old female with a history of Crohn's disease diagnosed in 66 (age 27), history of long-term steroid use with osteoporosis, S/P multiple bowel resections ([id number]) complicated by chronic back and abdominal pain, steatorrhea  and short bowel syndrome. The patient has been left with ~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 attached to proximal transverse colon through rectum. She has been   Depression    Diverticulosis    GERD (gastroesophageal reflux disease)    HTN (hypertension)    IDA (iron deficiency anemia)    Malnutrition ([place])    Mass in chest    Osteoporosis    Osteoporosis 12/24/2014   Pancreatitis    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; 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; jejunal to transverse colon anastomosis Treated at [facility] lengthening to 165cm Dr Alene Mires, [facility]   Vitamin B12 deficiency     Social History   Tobacco Use   Smoking status:  Former   Smokeless tobacco: Never  Scientific laboratory technician Use: Never used  Substance Use Topics   Alcohol use: Not Currently   Drug use: Never    Family History  Problem Relation Age of Onset   Seizures Mother    Glaucoma Mother    CAD Father    Heart disease Father    Hypertension Father    Breast cancer Sister    Multiple sclerosis Sister    Diabetes Sister    Lupus Sister    Colon cancer Other    Crohn's disease Other     Allergies  Allergen Reactions   Meperidine Hives    Other reaction(s): GI Upset Due to [place]    Hyoscyamine Hives and Swelling    Legs swelling Disorientation   Cefepime Other (See Comments)    Neurotoxicity occurring in setting of AKI. Ceftriaxone tolerated during same admit   Gabapentin Other (See Comments)    unknown   Lyrica [Pregabalin] Other (See Comments)    unknown   Topamax [Topiramate] Other (See Comments)    unknown   Zosyn [Piperacillin Sod-Tazobactam So] Other (See Comments)    Patient reports it makes her vomit, her neck stiff, and her "heart feel funny"   Fentanyl Rash    Pt is allergic to fentanyl patch related to the glue (gives her a rash) Pt states she is NOT allergic to fentanyl IV medicine   Morphine And Related Rash    Physical Exam: Constitutional: in no apparent distress  Vitals:   06/22/21 0540 06/22/21 1223  BP: 134/61 139/70  Pulse: 67 75  Resp: 16 18  Temp: 98.1 F (36.7 C) 98 F (36.7 C)  SpO2: 97% 99%   EYES: anicteric ENMT: no thrush Cardiovascular: Cor RRR Respiratory: clear; Musculoskeletal: no pedal edema noted Skin: negatives: no rash Neuro: non-focal  Lab Results  Component Value Date   WBC 2.5 (L) 06/22/2021   HGB 7.3 (L) 06/22/2021   HCT 23.5 (L) 06/22/2021   MCV 97.9 06/22/2021   PLT 129 (L) 06/22/2021    Lab Results  Component Value Date   CREATININE 1.58 (H) 06/22/2021   BUN 21 (H) 06/22/2021   NA 139 06/22/2021   K 3.6 06/22/2021   CL 112 (H) 06/22/2021   CO2 23 06/22/2021     Lab Results  Component Value Date   ALT 137 (H) 06/22/2021   AST 40 06/22/2021   ALKPHOS 167 (H) 06/22/2021     Microbiology: Recent Results (from the past 240 hour(s))  Urine Culture     Status: None   Collection Time: 06/12/21  8:15 PM   Specimen: Urine, Clean Catch  Result Value Ref Range Status   Specimen Description   Final    URINE, CLEAN CATCH Performed at [facility], [place] [address] [place], [place] [address]    Special Requests   Final    NONE Performed at [facility], [place] [address], [place], [place] [address]    Culture   Final    NO GROWTH Performed at [facility], [place] [address] [place], [place] [address]    Report Status 06/14/2021 FINAL  Final  Resp Panel by RT-PCR (Flu A&B, Covid) Nasopharyngeal Swab     Status: None   Collection Time: 06/19/21  9:20 PM   Specimen: Nasopharyngeal Swab; Nasopharyngeal(NP) swabs in vial transport medium  Result Value Ref Range Status   SARS Coronavirus 2 by RT PCR NEGATIVE NEGATIVE Final    Comment: (NOTE) SARS-CoV-2 target nucleic acids are NOT DETECTED.  The SARS-CoV-2 RNA is generally detectable in upper respiratory specimens during the acute phase of infection. The lowest concentration of SARS-CoV-2 viral copies this assay can detect is 138 copies/mL. A negative result does not preclude SARS-Cov-2 infection and should not be used as the sole basis for treatment or other patient management decisions. A negative result may occur with  improper specimen collection/handling, submission of specimen other than nasopharyngeal swab, presence of viral mutation(s) within the areas targeted by this assay, and inadequate number of viral copies(<138 copies/mL). A negative result must be combined with clinical observations, patient history, and epidemiological information. The expected result is Negative.  Fact Sheet for Patients:   EntrepreneurPulse.com.au  Fact Sheet for Healthcare Providers:  IncredibleEmployment.be  This test is no t yet approved or cleared by the [place] FDA and  has been authorized for detection and/or diagnosis of SARS-CoV-2 by FDA under an Emergency Use Authorization (EUA). This EUA will remain  in effect (meaning this test can be used) for the duration of the COVID-19 declaration under Section 564(b)(1) of the Act, 21 U.S.C.section 360bbb-3(b)(1), unless the authorization is terminated  or revoked sooner.       Influenza A by PCR NEGATIVE NEGATIVE Final   Influenza B by PCR NEGATIVE NEGATIVE Final    Comment: (NOTE) The Xpert Xpress SARS-CoV-2/FLU/RSV plus assay is intended as an aid in the diagnosis of influenza from Nasopharyngeal swab specimens and should not be used as a sole basis for treatment. Nasal washings and aspirates are unacceptable for Xpert Xpress SARS-CoV-2/FLU/RSV testing.  Fact Sheet for Patients: EntrepreneurPulse.com.au  Fact Sheet for Healthcare Providers: IncredibleEmployment.be  This test is not yet approved or cleared by the [place] FDA and has been authorized for detection and/or diagnosis of SARS-CoV-2 by FDA under an Emergency Use Authorization (EUA). This EUA will remain in effect (meaning this test can be used) for the duration of the COVID-19 declaration under Section 564(b)(1) of the Act, 21 U.S.C. section 360bbb-3(b)(1), unless the authorization is terminated or revoked.  Performed at [facility], [place] [address], [place], [place] [address]   Urine Culture     Status: Abnormal   Collection Time: 06/19/21  9:45 PM   Specimen: In/Out Cath Urine  Result Value Ref Range Status   Specimen Description   Final    IN/OUT CATH URINE Performed at [place] [address], [place], [place] [address]    Special Requests   Final     NONE Performed at [facility], [place] [address], [place],  [address]  Culture (A)  Final    4,000 COLONIES/mL STAPHYLOCOCCUS EPIDERMIDIS 10,000 COLONIES/mL LACTOBACILLUS SPECIES Standardized susceptibility testing for this organism is not available. Performed at [facility], [place] [address], [place], [place] [address]    Report Status 06/21/2021 FINAL  Final  Blood Culture (routine x 2)     Status: None (Preliminary result)   Collection Time: 06/19/21  9:45 PM   Specimen: BLOOD  Result Value Ref Range Status   Specimen Description   Final    BLOOD RIGHT ANTECUBITAL Performed at [place] [address], [place], [place] [address]    Special Requests   Final    BOTTLES DRAWN AEROBIC AND ANAEROBIC Blood Culture adequate volume Performed at [place] [address], [place], [place] [address]    Culture  Setup Time   Final    GRAM NEGATIVE RODS AEROBIC BOTTLE ONLY CRITICAL RESULT CALLED TO, READ BACK BY AND VERIFIED WITH: PHARMD M TUCKER [id number] FCP CRITICAL RESULT CALLED TO, READ BACK BY AND VERIFIED WITH: pharmd Dillard [place] 06/22/21@11 :25 by tw    Culture   Final    GRAM NEGATIVE RODS CRITICAL RESULT CALLED TO, READ BACK BY AND VERIFIED WITH: PHARMD M TUCKER [id number] FCP CULTURE REINCUBATED FOR BETTER GROWTH Performed at [facility], [place] [address] [place], [place] [address]    Report Status PENDING  Incomplete  Blood Culture ID Panel (Reflexed)     Status: None   Collection Time: 06/19/21  9:45 PM  Result Value Ref Range Status   Enterococcus faecalis NOT DETECTED NOT DETECTED Final   Enterococcus Faecium NOT DETECTED NOT DETECTED Final   Listeria monocytogenes NOT DETECTED NOT DETECTED Final   Staphylococcus species NOT DETECTED NOT DETECTED Final   Staphylococcus aureus (BCID) NOT DETECTED NOT DETECTED Final   Staphylococcus epidermidis NOT DETECTED NOT DETECTED Final    Staphylococcus lugdunensis NOT DETECTED NOT DETECTED Final   Streptococcus species NOT DETECTED NOT DETECTED Final   Streptococcus agalactiae NOT DETECTED NOT DETECTED Final   Streptococcus pneumoniae NOT DETECTED NOT DETECTED Final   Streptococcus pyogenes NOT DETECTED NOT DETECTED Final   A.calcoaceticus-baumannii NOT DETECTED NOT DETECTED Final   Bacteroides fragilis NOT DETECTED NOT DETECTED Final   Enterobacterales NOT DETECTED NOT DETECTED Final   Enterobacter cloacae complex NOT DETECTED NOT DETECTED Final   Escherichia coli NOT DETECTED NOT DETECTED Final   Klebsiella aerogenes NOT DETECTED NOT DETECTED Final   Klebsiella oxytoca NOT DETECTED NOT DETECTED Final   Klebsiella pneumoniae NOT DETECTED NOT DETECTED Final   Proteus species NOT DETECTED NOT DETECTED Final   Salmonella species NOT DETECTED NOT DETECTED Final   Serratia marcescens NOT DETECTED NOT DETECTED Final   Haemophilus influenzae NOT DETECTED NOT DETECTED Final   Neisseria meningitidis NOT DETECTED NOT DETECTED Final   Pseudomonas aeruginosa NOT DETECTED NOT DETECTED Final   Stenotrophomonas maltophilia NOT DETECTED NOT DETECTED Final   Candida albicans NOT DETECTED NOT DETECTED Final   Candida auris NOT DETECTED NOT DETECTED Final   Candida glabrata NOT DETECTED NOT DETECTED Final   Candida krusei NOT DETECTED NOT DETECTED Final   Candida parapsilosis NOT DETECTED NOT DETECTED Final   Candida tropicalis NOT DETECTED NOT DETECTED Final   Cryptococcus neoformans/gattii NOT DETECTED NOT DETECTED Final    Comment: Performed at [facility], [address], [place],  [address]  MRSA Next Gen by PCR, Nasal     Status: None   Collection  Time: 06/20/21  6:27 PM   Specimen: Nasal Mucosa; Nasal Swab  Result Value Ref Range Status   MRSA by PCR Next Gen NOT DETECTED NOT DETECTED Final    Comment: (NOTE) The GeneXpert MRSA Assay (FDA approved for NASAL specimens only), is one component of a comprehensive  MRSA colonization surveillance program. It is not intended to diagnose MRSA infection nor to guide or monitor treatment for MRSA infections. Test performance is not FDA approved in patients less than 100 years old. Performed at [facility], [place] [address], [place], [place] [address]     Caitlin Whitehorn W Nilza Eaker, MD [facility] [email or web address] 06/22/2021, 2:31 PM

## 2021-06-22 NOTE — Plan of Care (Signed)
  Problem: Clinical Measurements: Goal: Will remain free from infection Outcome: Progressing Goal: Diagnostic test results will improve Outcome: Progressing Goal: Respiratory complications will improve Outcome: Progressing Goal: Cardiovascular complication will be avoided Outcome: Progressing   Problem: Activity: Goal: Risk for activity intolerance will decrease Outcome: Progressing

## 2021-06-22 NOTE — Evaluation (Signed)
Occupational Therapy Evaluation Patient Details Name: Caitlin Peters MRN: [id number] DOB: [birth year] Today's Date: 06/22/2021    History of Present Illness Pt is a 60 y/o female admitted 6/27 secondary to increased L shoulder pain, nausea, and vomiting. MRI of L shoulder negative for acute infection. PMH includes chrons disease, short gut syndrome, pancreatitis, and thoracic discitis.   Clinical Impression   Caitlin Peters is a 60 year old woman admitted to hospital with above medical history. On evaluation she presents at her baseline in regards to functional abilities - able to perform ADLs and in room ambulation. Patient reports left shoulder pain being her biggest issue and had one treatment of home health OT prior to admission. Patient exhibits functional ROM, strength and coordination of left upper extremity. Reports neuropathy in bilateral hands. Reports pain grossly above the acromion and medially toward the neck. No provocative results with testing - pain does not increase with resistance, internal rotation or the Empty Can test. Patient does exhibit mild forward head, mild edema around the left clavicular area (which patient states as normal) muscle tightness around the next and upper traps and decreased cervical ROM. Patient provided with education and instruction to perform shoulder and neck stretches and cervical retractions to reduce muscle tension. Patient verbalized understanding and performed as needed. Therapist recommends follow up with orthopedic doctor to determine pain origin (could potentially be cervical or muscular in nature) that patient already has scheduled and then receive Outpatient therapy as patient may benefit from modalities.      Follow Up Recommendations  Other (comment) (Recommend follow up with orthopedic MD then pursue Outpatient Therapy.)    Equipment Recommendations  None recommended by OT    Recommendations for Other Services       Precautions /  Restrictions Precautions Precautions: None Restrictions Weight Bearing Restrictions: No      Mobility Bed Mobility Overal bed mobility: Independent                  Transfers Overall transfer level: Modified independent                    Balance Overall balance assessment: Mild deficits observed, not formally tested                                         ADL either performed or assessed with clinical judgement   ADL Overall ADL's : At baseline                             Toileting- Clothing Manipulation and Hygiene: [place] Patient Visual Report: No change from baseline       Perception     Praxis      Pertinent Vitals/Pain Pain Assessment: Faces Faces Pain Scale: Hurts even more Pain Location: L shoulder Pain Descriptors / Indicators: Grimacing;Guarding;Throbbing Pain Intervention(s): Monitored during session     Hand Dominance Right   Extremity/Trunk Assessment Upper Extremity Assessment Upper Extremity Assessment: RUE deficits/detail;LUE deficits/detail RUE Deficits / Details: WNL ROM, 5/5 strength RUE Sensation: history of peripheral neuropathy RUE Coordination: WNL LUE Deficits / Details: WFL ROM, 5/5 grip strength, 4/5 elbow strength, 4/5 shoulder strength LUE Sensation: history of peripheral neuropathy LUE Coordination: WNL   Lower Extremity Assessment Lower  Extremity Assessment: Defer to PT evaluation   Cervical / Trunk Assessment Cervical / Trunk Assessment: Normal   Communication Communication Communication: No difficulties   Cognition Arousal/Alertness: Awake/alert Behavior During Therapy: WFL for tasks assessed/performed Overall Cognitive Status: Within Functional Limits for tasks assessed                                     General Comments       Exercises     Shoulder Instructions      Home Living Family/patient expects to be discharged  to:: Private residence Living Arrangements: Children Available Help at Discharge: Family;Available PRN/intermittently Type of Home: Apartment Home Access: Stairs to enter Entrance Stairs-Number of Steps: 12 Entrance Stairs-Rails: Left Home Layout: One level     Bathroom Shower/Tub: Teacher, early years/pre: Standard Bathroom Accessibility: Yes   Home Equipment: Environmental consultant - 4 wheels;Walker - 2 wheels   Additional Comments: also has IV pole at home for TPN      Prior Functioning/Environment Level of Independence: Independent                 OT Problem List: Pain      OT Treatment/Interventions:      OT Goals(Current goals can be found in the care plan section) Acute Rehab OT Goals OT Goal Formulation: All assessment and education complete, DC therapy  OT Frequency:     Barriers to D/C:            Co-evaluation              AM-PAC OT "6 Clicks" Daily Activity     Outcome Measure Help from another person eating meals?: None Help from another person taking care of personal grooming?: None Help from another person toileting, which includes using toliet, bedpan, or urinal?: None Help from another person bathing (including washing, rinsing, drying)?: None Help from another person to put on and taking off regular upper body clothing?: None Help from another person to put on and taking off regular lower body clothing?: None 6 Click Score: 24   End of Session Nurse Communication:  (okay to see)  Activity Tolerance:   Patient left: Other (comment) (seated at side of bed)  OT Visit Diagnosis: Pain                Time: 7001-7494 OT Time Calculation (min): 21 min Charges:  OT General Charges $OT Visit: 1 Visit OT Evaluation $OT Eval Moderate Complexity: 1 Mod  Carmita Boom, OTR/L [place]  Office [phone] Pager: [place] 06/22/2021, 12:04 PM

## 2021-06-22 NOTE — Progress Notes (Signed)
PT Cancellation Note  Patient Details Name: Caitlin Peters MRN: [id number] DOB: 09/03/1961   Cancelled Treatment:    Reason Eval/Treat Not Completed: Pain limiting ability to participate Pt politely declines PT at this time.  Pt reports left shoulder pain and she just received medication and would like to try to nap.  Pt requests for PT to check back tomorrow.   Taseen Marasigan,KATHrine E 06/22/2021, 1:10 PM Arlyce Dice, DPT Acute Rehabilitation Services Pager: [phone] Office: [phone]

## 2021-06-22 NOTE — TOC Initial Note (Signed)
Transition of Care [facility]) - Initial/Assessment Note    Patient Details  Name: Caitlin Peters MRN: [id number] Date of Birth: 1961/05/13  Transition of Care [facility]) CM/SW Contact:    Dessa Phi, RN Phone Number: 06/22/2021, 9:44 AM  Clinical Narrative:spoke to patient about d/c plans-d/c home. Active w/[facility] nursing-tpn instruction/[facility] flushes/labs-rep Carolyn aware. [employer] rep Pam active w/TPN-formula,supplies.Await HHC orders if needed @ d/c. Has rw,would like rollator-[employer] rep Thedore Mins checked received rw in 2022-less than 20 years old-patient informed if wwants a rollator it would be private pay-[employer] await if patient wants, & is ordered one. Continue to follow.                   Expected Discharge Plan: [place] Barriers to Discharge: Continued Medical Work up   Patient Goals and CMS Choice Patient states their goals for this hospitalization and ongoing recovery are:: go home CMS Medicare.gov Compare Post Acute Care list provided to:: Patient Choice offered to / list presented to : Patient  Expected Discharge Plan and Services Expected Discharge Plan: [place]   Discharge Planning Services: CM Consult Post Acute Care Choice: [place] arrangements for the past 2 months: Single Family Home                                      Prior Living Arrangements/Services Living arrangements for the past 2 months: Single Family Home Lives with:: Adult Children Patient language and need for interpreter reviewed:: Yes Do you feel safe going back to the place where you live?: Yes      Need for Family Participation in Patient Care: No (Comment) Care giver support system in place?: Yes (comment) Current home services: Home RN, Other (comment), DME (rw;[facility] nursing-[facility] flushes,labs;[employer] TPN formula,supplies.) Criminal Activity/Legal Involvement Pertinent to Current Situation/Hospitalization: No - Comment as  needed  Activities of Daily Living Home Assistive Devices/Equipment: Eyeglasses, Environmental consultant (specify type) (tpn supplies) ADL Screening (condition at time of admission) Patient's cognitive ability adequate to safely complete daily activities?: Yes Is the patient deaf or have difficulty hearing?: No Does the patient have difficulty seeing, even when wearing glasses/contacts?: No Does the patient have difficulty concentrating, remembering, or making decisions?: No Patient able to express need for assistance with ADLs?: Yes Does the patient have difficulty dressing or bathing?: No Independently performs ADLs?: Yes (appropriate for developmental age) Does the patient have difficulty walking or climbing stairs?: Yes (needs a rail) Weakness of Legs: Both Weakness of Arms/Hands: Both  Permission Sought/Granted Permission sought to share information with : Case Manager Permission granted to share information with : Yes, Verbal Permission Granted  Share Information with NAME: Case manager           Emotional Assessment Appearance:: Appears stated age   Affect (typically observed): Accepting Orientation: : Oriented to Self, Oriented to Place, Oriented to  Time, Oriented to Situation Alcohol / Substance Use: Not Applicable Psych Involvement: No (comment)  Admission diagnosis:  Transaminitis [R74.01] SIRS (systemic inflammatory response syndrome) ([name]) [R65.10] Febrile illness [R50.9] Patient Active Problem List   Diagnosis Date Noted   SIRS (systemic inflammatory response syndrome) ([name]) 06/20/2021   Chronic idiopathic constipation    Left shoulder pain 05/23/2021   Elevated LFTs    Sepsis ([place]) 05/06/2021   Acidosis 04/08/2021   Colitis 04/07/2021   Intractable nausea and vomiting 03/11/2021   Thoracic discitis  02/10/2021   Pressure injury of skin 12/18/2020   Bacteremia 12/17/2020   Seizures ([place]) 12/17/2020   Drug-seeking behavior 12/16/2020   Seizure ([place]) 12/15/2020   CKD  (chronic kidney disease) stage 3, GFR 30-59 ml/min ([name]) 12/06/2020   COVID-19 virus infection 12/06/2020   Protein calorie malnutrition ([place]) 12/06/2020   Palpitations 11/22/2020   PAC (premature atrial contraction) 11/22/2020   History of central line-associated bloodstream infection (CLABSI) 10/21/2020   Nausea & vomiting 10/21/2020   Choledocholithiasis (sludge) s/p ERCP 10/2019 10/21/2020   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   Abdominal pain 14/97/0263   Acute metabolic encephalopathy    Hypomagnesemia    Partial small bowel obstruction ([name])    Bacteremia associated with intravascular line ([place]) 08/04/2020   Enterobacter sepsis ([place]) 07/22/2020   Fever of unknown origin 06/27/2020   Anxiety 06/03/2020   Acute pancreatitis 04/13/2020   Infection due to Acinetobacter species 03/10/2020   Pancytopenia ([place]) 03/05/2020   Central line infection    Elevated liver enzymes 01/02/2020   Cholangitis    Anasarca 10/28/2019   Acute kidney injury superimposed on chronic kidney disease ([place]) 10/28/2019   Falls 10/28/2019   Malnutrition ([place])    Vitamin B12 deficiency    GERD (gastroesophageal reflux disease)    Chronic pain syndrome    Hypokalemia due to excessive gastrointestinal loss of potassium 10/13/2019   Fever    Hypokalemia 10/12/2019   Chronic diarrhea 10/12/2019   Dysuria 10/12/2019   Bilateral lower extremity edema 10/12/2019   AKI (acute kidney injury) ([place]) 10/12/2019   Fungemia 08/27/2019   IDA (iron deficiency anemia) 11/03/2018   Dilation of biliary tract 08/28/2018   Severe diarrhea 03/07/2018   LFTs abnormal 01/27/2018   GI tract obstruction ([place]) 01/27/2018   Gram-negative bacteremia 09/13/2017   HTN (hypertension), benign 12/02/2016   Intractable pain 12/02/2016   Anemia 12/02/2016   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   Avascular necrosis ([place]) 06/14/2015   Polyarthralgia 05/03/2015   On total parenteral nutrition (TPN) 12/30/2014   Osteoporosis  12/24/2014   Low back pain 12/16/2014   Vitamin D deficiency 12/16/2014   Short gut syndrome 07/15/2014   History of colonic diverticulitis 2014   Depression 07/24/2012   Gram-positive bacteremia 07/24/2012   Small bowel obstruction due to adhesions ([place]) 07/24/2012   Diarrhea 12/13/2011   Neuralgia and neuritis 06/01/2011   Myalgia and myositis 08/12/2003   Crohn's disease of small & large intestine with [place]   PCP:  [name], [name], MD Pharmacy:   [place], [address] [place] [address] Phone: [phone] Fax: [phone]     Social Determinants of Health (SDOH) Interventions    Readmission Risk Interventions Readmission Risk Prevention Plan 06/22/2021 02/10/2021 12/21/2020  Transportation Screening Complete Complete Complete  PCP or Specialist Appt within 3-5 Days - - -  Not Complete comments - - -  HRI or [place] for [place] consult not completed comments - - -  Palliative Care Screening - - -  Medication Review Press photographer) Complete Complete -  PCP or Specialist appointment within 3-5 days of discharge Complete - Complete  HRI or Home Care Consult Complete Complete Complete  SW Recovery Care/Counseling Consult Complete Complete Complete  Palliative Care Screening Not Applicable Not Applicable -  [place] Not Applicable Not Applicable -

## 2021-06-23 DIAGNOSIS — R945 Abnormal results of liver function studies: Secondary | ICD-10-CM | POA: Diagnosis not present

## 2021-06-23 DIAGNOSIS — A4159 Other Gram-negative sepsis: Secondary | ICD-10-CM | POA: Diagnosis not present

## 2021-06-23 DIAGNOSIS — Z789 Other specified health status: Secondary | ICD-10-CM | POA: Diagnosis not present

## 2021-06-23 DIAGNOSIS — K50819 Crohn's disease of both small and large intestine with unspecified complications: Secondary | ICD-10-CM | POA: Diagnosis not present

## 2021-06-23 DIAGNOSIS — K219 Gastro-esophageal reflux disease without esophagitis: Secondary | ICD-10-CM | POA: Diagnosis not present

## 2021-06-23 DIAGNOSIS — R6510 Systemic inflammatory response syndrome (SIRS) of non-infectious origin without acute organ dysfunction: Secondary | ICD-10-CM | POA: Diagnosis not present

## 2021-06-23 DIAGNOSIS — R7401 Elevation of levels of liver transaminase levels: Secondary | ICD-10-CM | POA: Diagnosis not present

## 2021-06-23 LAB — CBC WITH DIFFERENTIAL/PLATELET
Abs Immature Granulocytes: 0.01 10*3/uL (ref 0.00–0.07)
Basophils Absolute: 0 10*3/uL (ref 0.0–0.1)
Basophils Relative: 0 %
Eosinophils Absolute: 0.1 10*3/uL (ref 0.0–0.5)
Eosinophils Relative: 3 %
HCT: 23.8 % — ABNORMAL LOW (ref 36.0–46.0)
Hemoglobin: 7.6 g/dL — ABNORMAL LOW (ref 12.0–15.0)
Immature Granulocytes: 0 %
Lymphocytes Relative: 42 %
Lymphs Abs: 1.3 10*3/uL (ref 0.7–4.0)
MCH: 30.6 pg (ref 26.0–34.0)
MCHC: 31.9 g/dL (ref 30.0–36.0)
MCV: 96 fL (ref 80.0–100.0)
Monocytes Absolute: 0.4 10*3/uL (ref 0.1–1.0)
Monocytes Relative: 12 %
Neutro Abs: 1.4 10*3/uL — ABNORMAL LOW (ref 1.7–7.7)
Neutrophils Relative %: 43 %
Platelets: 121 10*3/uL — ABNORMAL LOW (ref 150–400)
RBC: 2.48 MIL/uL — ABNORMAL LOW (ref 3.87–5.11)
RDW: 14.3 % (ref 11.5–15.5)
WBC: 3.2 10*3/uL — ABNORMAL LOW (ref 4.0–10.5)
nRBC: 0 % (ref 0.0–0.2)

## 2021-06-23 LAB — COMPREHENSIVE METABOLIC PANEL
ALT: 93 U/L — ABNORMAL HIGH (ref 0–44)
AST: 22 U/L (ref 15–41)
Albumin: 2.4 g/dL — ABNORMAL LOW (ref 3.5–5.0)
Alkaline Phosphatase: 154 U/L — ABNORMAL HIGH (ref 38–126)
Anion gap: 5 (ref 5–15)
BUN: 18 mg/dL (ref 6–20)
CO2: 22 mmol/L (ref 22–32)
Calcium: 8.4 mg/dL — ABNORMAL LOW (ref 8.9–10.3)
Chloride: 111 mmol/L (ref 98–111)
Creatinine, Ser: 1.51 mg/dL — ABNORMAL HIGH (ref 0.44–1.00)
GFR, Estimated: 39 mL/min — ABNORMAL LOW (ref >60)
Glucose, Bld: 101 mg/dL — ABNORMAL HIGH (ref 70–99)
Potassium: 3.1 mmol/L — ABNORMAL LOW (ref 3.5–5.1)
Sodium: 138 mmol/L (ref 135–145)
Total Bilirubin: 0.5 mg/dL (ref 0.3–1.2)
Total Protein: 5.5 g/dL — ABNORMAL LOW (ref 6.5–8.1)

## 2021-06-23 LAB — GLUCOSE, CAPILLARY
Glucose-Capillary: 89 mg/dL (ref 70–99)
Glucose-Capillary: 86 mg/dL (ref 70–99)
Glucose-Capillary: 87 mg/dL (ref 70–99)
Glucose-Capillary: 101 mg/dL — ABNORMAL HIGH (ref 70–99)
Glucose-Capillary: 107 mg/dL — ABNORMAL HIGH (ref 70–99)

## 2021-06-23 LAB — MAGNESIUM: Magnesium: 1.8 mg/dL (ref 1.7–2.4)

## 2021-06-23 LAB — PHOSPHORUS: Phosphorus: 3.5 mg/dL (ref 2.5–4.6)

## 2021-06-23 MED ORDER — POTASSIUM CHLORIDE CRYS ER 20 MEQ PO TBCR
40.0000 meq | EXTENDED_RELEASE_TABLET | Freq: Two times a day (BID) | ORAL | Status: AC
  Administered 2021-06-23 (×2): 40 meq via ORAL
  Filled 2021-06-23 (×2): qty 2

## 2021-06-23 MED ORDER — SODIUM CHLORIDE 0.9 % IV SOLN
2.0000 g | Freq: Two times a day (BID) | INTRAVENOUS | Status: DC
  Administered 2021-06-23 – 2021-06-25 (×5): 2 g via INTRAVENOUS
  Filled 2021-06-23 (×5): qty 2

## 2021-06-23 NOTE — Progress Notes (Signed)
    Caitlin Peters for Infectious Disease   Reason for visit: Follow up on bacteremia  Interval History: no ID yet of organism  Physical Exam: Constitutional:  Vitals:   06/23/21 1434 06/23/21 1437  BP: (!) 145/68   Pulse: (!) 121 75  Resp: 20   Temp: 98.3 F (36.8 C)   SpO2: 97%    patient appears in NAD  Impression: line-related GNR bacteremia.  She is doing well and likely good on cefepime.  I recommend treatment for 14 days through August 10th via her current line.  Will watch the organism over the weekend to see if change of therapy indicated.   Dr. Gale Journey on over the weekend  Plan: 1.  IV cefepime through August 10th if organism ameneable.

## 2021-06-23 NOTE — TOC Progression Note (Signed)
Transition of Care [facility]) - Progression Note    Patient Details  Name: Caitlin Peters MRN: [id number] Date of Birth: Jul 14, 1961  Transition of Care [facility]) CM/SW Contact  Lydiana Milley, Juliann Pulse, RN Phone Number: 06/23/2021, 1:04 PM  Clinical Narrative:  Noted PT recc otpt PT-will continue to follow since may need [place] for TPN, if d/c plan home will provide HHPT through [facility] who is already following.     Expected Discharge Plan: [place] Barriers to Discharge: Continued Medical Work up  Expected Discharge Plan and Services Expected Discharge Plan: [place]   Discharge Planning Services: CM Consult Post Acute Care Choice: [place] arrangements for the past 2 months: Single Family Home                                       Social Determinants of Health (SDOH) Interventions    Readmission Risk Interventions Readmission Risk Prevention Plan 06/22/2021 02/10/2021 12/21/2020  Transportation Screening Complete Complete Complete  PCP or Specialist Appt within 3-5 Days - - -  Not Complete comments - - -  HRI or [place] Work Consult for [place] Planning/Counseling - - -  SW consult not completed comments - - -  Palliative Care Screening - - -  Medication Review Press photographer) Complete Complete -  PCP or Specialist appointment within 3-5 days of discharge Complete - Complete  HRI or Home Care Consult Complete Complete Complete  SW Recovery Care/Counseling Consult Complete Complete Complete  Palliative Care Screening Not Applicable Not Applicable -  [place] Not Applicable Not Applicable -

## 2021-06-23 NOTE — Evaluation (Signed)
Physical Therapy One Time Evaluation Patient Details Name: Caitlin Peters MRN: [id number] DOB: Dec 14, 1960 Today's Date: 06/23/2021   History of Present Illness  Pt is a 60 y/o female admitted 6/27 secondary to increased L shoulder pain, nausea, and vomiting, found to have Sepsis 2/2 to GNR Bacteremia, POA. MRI of L shoulder negative for acute infection. PMH includes chrons disease, short gut syndrome, pancreatitis, and thoracic discitis.  Clinical Impression  Patient evaluated by Physical Therapy with no further acute PT needs identified. All education has been completed and the patient has no further questions.  Pt mobilizing very well and encouraged to ambulate in hallway during remainder of hospitalization.  Pt agreeable to f/u with OPPT for left shoulder pain.  See below for any follow-up Physical Therapy or equipment needs. PT is signing off. Thank you for this referral.     Follow Up Recommendations Outpatient PT    Equipment Recommendations  None recommended by PT    Recommendations for Other Services       Precautions / Restrictions Precautions Precautions: Other (comment) Precaution Comments: cautious of Right femoral PICC line Restrictions Weight Bearing Restrictions: No      Mobility  Bed Mobility Overal bed mobility: Independent                  Transfers Overall transfer level: Modified independent                  Ambulation/Gait Ambulation/Gait assistance: Modified independent (Device/Increase time);Supervision Gait Distance (Feet): 400 Feet Assistive device: IV Pole Gait Pattern/deviations: WFL(Within Functional Limits)     General Gait Details: pt denies symptoms however does report left shoulder and upper arm pain, pt pushed IV pole (which she also uses at home)  Stairs            Wheelchair Mobility    Modified Rankin (Stroke Patients Only)       Balance                                              Pertinent Vitals/Pain Pain Assessment: Faces Faces Pain Scale: Hurts even more Pain Location: L shoulder Pain Descriptors / Indicators: Grimacing;Guarding;Throbbing Pain Intervention(s): Monitored during session    Home Living Family/patient expects to be discharged to:: Private residence Living Arrangements: Children Available Help at Discharge: Family;Available PRN/intermittently Type of Home: Apartment Home Access: Stairs to enter Entrance Stairs-Rails: Left Entrance Stairs-Number of Steps: 12 Home Layout: One level Home Equipment: Walker - 4 wheels;Walker - 2 wheels Additional Comments: also has IV pole at home for TPN; also has air mattress bed    Prior Function Level of Independence: Independent               Hand Dominance   Dominant Hand: Right    Extremity/Trunk Assessment        Lower Extremity Assessment Lower Extremity Assessment: Overall WFL for tasks assessed       Communication   Communication: No difficulties  Cognition Arousal/Alertness: Awake/alert Behavior During Therapy: WFL for tasks assessed/performed Overall Cognitive Status: Within Functional Limits for tasks assessed                                        General Comments      Exercises  Assessment/Plan    PT Assessment All further PT needs can be met in the next venue of care  PT Problem List Decreased strength;Decreased range of motion       PT Treatment Interventions      PT Goals (Current goals can be found in the Care Plan section)  Acute Rehab PT Goals PT Goal Formulation: All assessment and education complete, DC therapy    Frequency     Barriers to discharge        Co-evaluation               AM-PAC PT "6 Clicks" Mobility  Outcome Measure Help needed turning from your back to your side while in a flat bed without using bedrails?: None Help needed moving from lying on your back to sitting on the side of a flat bed without  using bedrails?: None Help needed moving to and from a bed to a chair (including a wheelchair)?: None Help needed standing up from a chair using your arms (e.g., wheelchair or bedside chair)?: None Help needed to walk in hospital room?: None Help needed climbing 3-5 steps with a railing? : A Little 6 Click Score: 23    End of Session Equipment Utilized During Treatment: Gait belt Activity Tolerance: Patient tolerated treatment well Patient left: in bed;with call bell/phone within reach;with nursing/sitter in room Nurse Communication: Mobility status PT Visit Diagnosis: Pain Pain - Right/Left: Left Pain - part of body: Shoulder    Time: 1660-6004 PT Time Calculation (min) (ACUTE ONLY): 15 min   Charges:   PT Evaluation $PT Eval Low Complexity: 1 Low        Kati PT, DPT Acute Rehabilitation Services Pager: [phone] Office: [phone]   Trena Platt 06/23/2021, 12:31 PM

## 2021-06-23 NOTE — Care Management Important Message (Signed)
Important Message  Patient Details IM Letter given to the Patient. Name: Caitlin Peters MRN: [id number] Date of Birth: 06-Jan-1961   Medicare Important Message Given:  Yes     [name] 06/23/2021, 11:18 AM

## 2021-06-23 NOTE — Progress Notes (Signed)
PROGRESS NOTE    Caitlin Peters  GBT:[id number] DOB: February 26, 1961 DOA: 06/19/2021 PCP: [name], [name], MD   Brief Narrative:  The patient is a 60 year old African-American female with a past medical history significant for but not limited to short gut syndrome requiring TPN, history of Crohn's disease with multiple surgeries, chronic pain syndrome, history of chronic PICC line for IV access as well as other comorbidities including chronic pancreatitis who presented to the ED on 06/19/2021 with fever and chills.  She reports that she has not felt well since 05/29/2021 when she was hospitalized late in June 2022.  Since leaving the hospital she has noticed pain in her left shoulder and arm.  She states that fever and chills began the day before admission and has been intermittent.  She reported diaphoresis at night and her fever at home was as high as 102.  She also admits that chronic abdominal pain located upper abdomen on the left side that radiates to the back that is 7 out of 10 and characterized as cramping and sharp as well as dull at times.  She states that her abdominal pain is alleviated by nothing and exacerbated by eating.  She had fevers and chills that were alleviated by nothing and exacerbated by nothing.  Associated symptoms include nausea and vomiting intermittently.  She states that she has chronic diarrhea more like Jell-O consistency but did not notice any blood and did not have any hematuria or dysuria.  She presented the ED and she is found to be febrile at 108 with a pulse rate of 104.  She was leukopenic and did have abnormal LFTs which are now improving.  Urinalysis showed no leukocytes or nitrites and cultures were drawn however.  She was placed on IV antibiotics with IV vancomycin and IV ceftriaxone and now changed to IV Cefepemime by ID given her GNR Bacteremia   Assessment & Plan:   Principal Problem:   SIRS (systemic inflammatory response syndrome) ([name]) Active  Problems:   Crohn's disease of small & large intestine with SGS   Short gut syndrome   GERD (gastroesophageal reflux disease)   Chronic pain syndrome   On total parenteral nutrition (TPN)   LFTs abnormal   Pancytopenia ([name])  Sepsis 2/2 to GNR Bacteremia, poA  -Present on admission and in the setting of GNR Bacteremia. Source: No known source of infection, but could have a PICC line infection or an abdominal infection.  -She does have a history of PICC line infections previously -Sepsis order set. Cultures ordered. And Blood Cx x2 showing GNR but Sensitivities Still pending  -Continued IV antibiotics: IV ceftriaxone, IV metronidazole, and IV vancomycin but now that she has a GNR bacteremia with no identification ID was consulted and changed her to IV Cefepime pending Identification of Organism -She was given IV fluids to provide volume and remains on Prosource +30 mL p.o. twice daily and is being continued on Normal saline at 60 mL/h -Monitor for signs of volume depletion; monitor blood pressure carefully.  -Close monitoring of temperature curve and WBC; WBC has gone from 2.9 -> 3.8 -> 4.6 -> 3.5 -> 2.5 -> 3.2 -Initial lactic acid was normal and repeat was also normal  -Given her Gram Negative Bacteremia ID feels no absolute indication for line exchange at this time -Appreciate ID Recc's   Pancytopenia [facility]) -May be related to SIRS. -WBC was 3.5, hemoglobin/hematocrit was 7.4/23.5, and platelet count was 114; -Now WBC is 3.2, Hgb/Hct is 7.6/23.8 and Platelet  Count is 121 -Continue monitor and trend and repeat CBC in a.m.   Short gut syndrome -With chronic TPN use.   -Holding home TPN given Bacteremia.  Give IV fluids.   Abnormal LFTs, improving  In the setting of TPN and Bacteremic infection -Patient's AST on admission was 544 is now trended down to 22 and patient's ALT was 365 and has now trended down to 93 -Monitor and trend LFTs and if necessary will obtain a right upper quadrant  ultrasound as well as an acute hepatitis panel  GERD (gastroesophageal reflux disease) -Continue home medications with pantoprazole 40 mg p.o. daily as well as famotidine 20 mg p.o. daily as needed heartburn and indigestion -Also continue sucralfate 1 g p.o. 3 times daily  Hypokalemia -Patient's K+ is now 3.1 -Replete with po Kcl 40 mEQ BID x2 -Continue to Monitor and Replete as Necessary -Repeat CMP in the AM   Chronic pain syndrome -Continue home medication with methadone 5 mg p.o. 5 times daily as well as p.o. hydromorphone 4 mg every 4 hours as needed severe pain as well as IV hydromorphone 1 mg every 2 hours as needed severe pain -Continue with methocarbamol 500 g p.o. twice daily   Crohn's disease of the small and large intestine with short gut syndrome  -Continued home TPN but held given GNR bacteremia  -Continue with home budesonide 9 mg p.o. daily and with teduglutide 1.5 mg subcu daily for Short Gut -Followed by gastroenterology at [facility]  Left shoulder pain -Had an MRI towards the end of June and did not show any acute findings and is seeing her primary care provider -She has been referred to orthopedic surgery and has appointment August 1.  AKI on chronic kidney disease stage IIIa Metabolic acidosis -Stable and improved.  Now BUN/Cr is 18/1.51 Continue with IV fluid hydration as above -Patient had a small metabolic acidosis with a CO2 21, anion gap of 11, chloride level of 108; Now CO2 is 22, AG is 5, and Chloride Level is 111 -Avoid further nephrotoxic medications, contrast dyes, hypotension and renally dose medications Repeat CMP in a.m.  Depression and Anxiety -Continue with Bupropion ER 20 g p.o. daily and duloxetine 90 mg p.o. daily  Hypertension -Continue to monitor blood pressures per protocol  -Continue with amlodipine 10 mg p.o. daily as well as carvedilol 25 mg p.o. twice daily -Continue to Monitor BP per Protocol -Last BP was 136/61  History of recurrent  pancreatitis -Continue with Creon 36,000 units p.o. 3 times daily with meals  DVT prophylaxis: Enoxaparin 40 mg sq q24h Code Status: FULL CODE  Family Communication: No family present at bedside Disposition Plan: Pending further clinical improvement  Status is: Inpatient  Remains inpatient appropriate because:Unsafe d/c plan, IV treatments appropriate due to intensity of illness or inability to take PO, and Inpatient level of care appropriate due to severity of illness  Dispo:  Patient From: Home  Planned Disposition: Home with Health Care Svc  Medically stable for discharge: No    Consultants:  Infectious Diseases   Procedures: None  Antimicrobials:  Anti-infectives (From admission, onward)    Start     Dose/Rate Route Frequency Ordered Stop   06/23/21 1600  ceFEPIme (MAXIPIME) 2 g in sodium chloride 0.9 % 100 mL IVPB  Status:  Discontinued        2 g 200 mL/hr over 30 Minutes Intravenous Every 12 hours 06/22/21 1452 06/23/21 0843   06/23/21 1000  ceFEPIme (MAXIPIME) 2 g in sodium chloride  0.9 % 100 mL IVPB        2 g 200 mL/hr over 30 Minutes Intravenous Every 12 hours 06/23/21 0843     06/22/21 1300  ceFEPIme (MAXIPIME) 2 g in sodium chloride 0.9 % 100 mL IVPB  Status:  Discontinued        2 g 200 mL/hr over 30 Minutes Intravenous Every 12 hours 06/22/21 1213 06/22/21 1452   06/20/21 2300  cefTRIAXone (ROCEPHIN) 2 g in sodium chloride 0.9 % 100 mL IVPB  Status:  Discontinued        2 g 200 mL/hr over 30 Minutes Intravenous Every 24 hours 06/20/21 0354 06/22/21 1213   06/20/21 2200  vancomycin (VANCOREADY) IVPB 750 mg/150 mL  Status:  Discontinued        750 mg 150 mL/hr over 60 Minutes Intravenous Every 24 hours 06/20/21 0152 06/22/21 1213   06/20/21 0400  metroNIDAZOLE (FLAGYL) IVPB 500 mg  Status:  Discontinued        500 mg 100 mL/hr over 60 Minutes Intravenous Every 8 hours 06/20/21 0354 06/22/21 1213   06/19/21 2345  cefTRIAXone (ROCEPHIN) 1 g in sodium chloride  0.9 % 100 mL IVPB        1 g 200 mL/hr over 30 Minutes Intravenous  Once 06/19/21 2341 06/20/21 0151   06/19/21 2300  cefTRIAXone (ROCEPHIN) 1 g in sodium chloride 0.9 % 100 mL IVPB        1 g 200 mL/hr over 30 Minutes Intravenous  Once 06/19/21 2253 06/20/21 0011   06/19/21 2300  vancomycin (VANCOCIN) IVPB 1000 mg/200 mL premix        1,000 mg 200 mL/hr over 60 Minutes Intravenous  Once 06/19/21 2253 06/20/21 0113        Subjective: Seen and examined at bedside and thinks she is doing okay.  Had some pain last night.  No nausea or vomiting.  About to give herself her Teduglutide Crohn's medication.  No chest pain or shortness of breath.  No other concerns or complaints at this time.  Objective: Vitals:   06/22/21 0540 06/22/21 1223 06/22/21 2222 06/23/21 0551  BP: 134/61 139/70 126/62 136/61  Pulse: 67 75 67 (!) 110  Resp: 16 18 16 20   Temp: 98.1 F (36.7 C) 98 F (36.7 C) 98.5 F (36.9 C) 98.7 F (37.1 C)  TempSrc: Oral  Oral Oral  SpO2: 97% 99% 97% 96%  Weight: 67.5 kg     Height:        Intake/Output Summary (Last 24 hours) at 06/23/2021 1431 Last data filed at 06/23/2021 1000 Gross per 24 hour  Intake 2296.45 ml  Output --  Net 2296.45 ml    Filed Weights   06/19/21 2122 06/21/21 0353 06/22/21 0540  Weight: 59 kg 64.8 kg 67.5 kg   Examination: Physical Exam:  Constitutional: WN/WD chronically ill appearing AAF in NAD and appears calm and comfortable Eyes: Lids and conjunctivae normal, sclerae anicteric  ENMT: External Ears, Nose appear normal. Grossly normal hearing.  Neck: Appears normal, supple, no cervical masses, normal ROM, no appreciable thyromegaly; no JVD Respiratory: Diminished to auscultation bilaterally, no wheezing, rales, rhonchi or crackles. Normal respiratory effort and patient is not tachypenic. No accessory muscle use. Unlabored breathing  Cardiovascular: RRR, no murmurs / rubs / gallops. S1 and S2 auscultated. No extremity edema.  Abdomen:  Soft, non-tender. Bowel sounds positive.  GU: Deferred. Musculoskeletal: No clubbing / cyanosis of digits/nails. No joint deformity upper and lower extremities. .  Skin:  No rashes, lesions, ulcers on a limited skin evaluation. No induration; Warm and dry.  Neurologic: CN 2-12 grossly intact with no focal deficits.Romberg sign and cerebellar reflexes not assessed.  Psychiatric: Normal judgment and insight. Alert and oriented x 3. Normal mood and appropriate affect.   Data Reviewed: I have personally reviewed following labs and imaging studies  CBC: Recent Labs  Lab 06/19/21 2145 06/19/21 2330 06/20/21 0454 06/21/21 0517 06/22/21 0320 06/23/21 0413  WBC 2.9* 3.8* 4.6 3.5* 2.5* 3.2*  NEUTROABS 2.4 3.2  --  2.0 1.2* 1.4*  HGB 7.6* 7.2* 7.4* 7.4* 7.3* 7.6*  HCT 23.2* 21.8* 23.2* 23.5* 23.5* 23.8*  MCV 92.8 92.4 93.5 97.5 97.9 96.0  PLT 111* 110* 117* 114* 129* 121*    Basic Metabolic Panel: Recent Labs  Lab 06/19/21 2145 06/20/21 0454 06/21/21 0517 06/22/21 0320 06/23/21 0413  NA 136 139 140 139 138  K 3.6 3.9 3.9 3.6 3.1*  CL 105 111 108 112* 111  CO2 22 24 21* 23 22  GLUCOSE 92 113* 88 117* 101*  BUN 45* 37* 24* 21* 18  CREATININE 1.49* 1.46* 1.34* 1.58* 1.51*  CALCIUM 8.4* 8.4* 8.9 8.6* 8.4*  MG  --   --  1.9 1.9 1.8  PHOS  --   --  4.0 3.6 3.5    GFR: Estimated Creatinine Clearance: 40 mL/min (A) (by C-G formula based on SCr of 1.51 mg/dL (H)). Liver Function Tests: Recent Labs  Lab 06/19/21 2145 06/20/21 0454 06/21/21 0517 06/22/21 0320 06/23/21 0413  AST 544* 282* 91* 40 22  ALT 365* 313* 197* 137* 93*  ALKPHOS 244* 230* 181* 167* 154*  BILITOT 1.0 1.0 0.6 0.5 0.5  PROT 5.7* 5.7* 5.5* 5.6* 5.5*  ALBUMIN 2.6* 2.5* 2.4* 2.3* 2.4*    Recent Labs  Lab 06/19/21 2330  LIPASE 30    No results for input(s): AMMONIA in the last 168 hours. Coagulation Profile: Recent Labs  Lab 06/19/21 2145  INR 1.0    Cardiac Enzymes: No results for input(s):  CKTOTAL, CKMB, CKMBINDEX, TROPONINI in the last 168 hours. BNP (last 3 results) No results for input(s): PROBNP in the last 8760 hours. HbA1C: No results for input(s): HGBA1C in the last 72 hours. CBG: Recent Labs  Lab 06/22/21 1827 06/23/21 0125 06/23/21 0549 06/23/21 0746 06/23/21 1142  GLUCAP 94 89 86 87 101*    Lipid Profile: Recent Labs    06/21/21 0517  TRIG 91    Thyroid Function Tests: No results for input(s): TSH, T4TOTAL, FREET4, T3FREE, THYROIDAB in the last 72 hours. Anemia Panel: No results for input(s): VITAMINB12, FOLATE, FERRITIN, TIBC, IRON, RETICCTPCT in the last 72 hours. Sepsis Labs: Recent Labs  Lab 06/19/21 2145 06/19/21 2330 06/20/21 0454  LATICACIDVEN 0.6 0.6 0.5    Recent Results (from the past 240 hour(s))  Resp Panel by RT-PCR (Flu A&B, Covid) Nasopharyngeal Swab     Status: None   Collection Time: 06/19/21  9:20 PM   Specimen: Nasopharyngeal Swab; Nasopharyngeal(NP) swabs in vial transport medium  Result Value Ref Range Status   SARS Coronavirus 2 by RT PCR NEGATIVE NEGATIVE Final    Comment: (NOTE) SARS-CoV-2 target nucleic acids are NOT DETECTED.  The SARS-CoV-2 RNA is generally detectable in upper respiratory specimens during the acute phase of infection. The lowest concentration of SARS-CoV-2 viral copies this assay can detect is 138 copies/mL. A negative result does not preclude SARS-Cov-2 infection and should not be used as the sole basis for treatment or  other patient management decisions. A negative result may occur with  improper specimen collection/handling, submission of specimen other than nasopharyngeal swab, presence of viral mutation(s) within the areas targeted by this assay, and inadequate number of viral copies(<138 copies/mL). A negative result must be combined with clinical observations, patient history, and epidemiological information. The expected result is Negative.  Fact Sheet for Patients:   EntrepreneurPulse.com.au  Fact Sheet for Healthcare Providers:  IncredibleEmployment.be  This test is no t yet approved or cleared by the [place] FDA and  has been authorized for detection and/or diagnosis of SARS-CoV-2 by FDA under an Emergency Use Authorization (EUA). This EUA will remain  in effect (meaning this test can be used) for the duration of the COVID-19 declaration under Section 564(b)(1) of the Act, 21 U.S.C.section 360bbb-3(b)(1), unless the authorization is terminated  or revoked sooner.       Influenza A by PCR NEGATIVE NEGATIVE Final   Influenza B by PCR NEGATIVE NEGATIVE Final    Comment: (NOTE) The Xpert Xpress SARS-CoV-2/FLU/RSV plus assay is intended as an aid in the diagnosis of influenza from Nasopharyngeal swab specimens and should not be used as a sole basis for treatment. Nasal washings and aspirates are unacceptable for Xpert Xpress SARS-CoV-2/FLU/RSV testing.  Fact Sheet for Patients: EntrepreneurPulse.com.au  Fact Sheet for Healthcare Providers: IncredibleEmployment.be  This test is not yet approved or cleared by the [place] FDA and has been authorized for detection and/or diagnosis of SARS-CoV-2 by FDA under an Emergency Use Authorization (EUA). This EUA will remain in effect (meaning this test can be used) for the duration of the COVID-19 declaration under Section 564(b)(1) of the Act, 21 U.S.C. section 360bbb-3(b)(1), unless the authorization is terminated or revoked.  Performed at [facility], [place] [address] [place], [place] [address]   Urine Culture     Status: Abnormal   Collection Time: 06/19/21  9:45 PM   Specimen: In/Out Cath Urine  Result Value Ref Range Status   Specimen Description   Final    IN/OUT CATH URINE Performed at [facility] [address], [place], [place] [address]    Special Requests   Final     NONE Performed at [facility], [place] [address] [place], [place] [address]    Culture (A)  Final    4,000 COLONIES/mL STAPHYLOCOCCUS EPIDERMIDIS 10,000 COLONIES/mL LACTOBACILLUS SPECIES Standardized susceptibility testing for this organism is not available. Performed at [facility], [place] [address] [place], [place] [address]    Report Status 06/21/2021 FINAL  Final  Blood Culture (routine x 2)     Status: None (Preliminary result)   Collection Time: 06/19/21  9:45 PM   Specimen: BLOOD  Result Value Ref Range Status   Specimen Description   Final    BLOOD RIGHT ANTECUBITAL Performed at [facility] [address] [place], [place] [address]    Special Requests   Final    BOTTLES DRAWN AEROBIC AND ANAEROBIC Blood Culture adequate volume Performed at [facility] [address] [place],  [address]    Culture  Setup Time   Final    GRAM NEGATIVE RODS AEROBIC BOTTLE ONLY CRITICAL RESULT CALLED TO, READ BACK BY AND VERIFIED WITH: PHARMD M TUCKER [id number] FCP CRITICAL RESULT CALLED TO, READ BACK BY AND VERIFIED WITH: pharmd Dillard [place] 06/22/21@11 :25 by tw    Culture   Final    GRAM NEGATIVE RODS CRITICAL RESULT CALLED TO,  READ BACK BY AND VERIFIED WITH: PHARMD M TUCKER [id number] FCP IDENTIFICATION AND SUSCEPTIBILITIES TO FOLLOW Performed at [facility], [place] [address], [place], [place] [address]    Report Status PENDING  Incomplete  Blood Culture ID Panel (Reflexed)     Status: None   Collection Time: 06/19/21  9:45 PM  Result Value Ref Range Status   Enterococcus faecalis NOT DETECTED NOT DETECTED Final   Enterococcus Faecium NOT DETECTED NOT DETECTED Final   Listeria monocytogenes NOT DETECTED NOT DETECTED Final   Staphylococcus species NOT DETECTED NOT DETECTED Final   Staphylococcus aureus (BCID) NOT DETECTED NOT DETECTED Final   Staphylococcus epidermidis NOT DETECTED NOT DETECTED Final    Staphylococcus lugdunensis NOT DETECTED NOT DETECTED Final   Streptococcus species NOT DETECTED NOT DETECTED Final   Streptococcus agalactiae NOT DETECTED NOT DETECTED Final   Streptococcus pneumoniae NOT DETECTED NOT DETECTED Final   Streptococcus pyogenes NOT DETECTED NOT DETECTED Final   A.calcoaceticus-baumannii NOT DETECTED NOT DETECTED Final   Bacteroides fragilis NOT DETECTED NOT DETECTED Final   Enterobacterales NOT DETECTED NOT DETECTED Final   Enterobacter cloacae complex NOT DETECTED NOT DETECTED Final   Escherichia coli NOT DETECTED NOT DETECTED Final   Klebsiella aerogenes NOT DETECTED NOT DETECTED Final   Klebsiella oxytoca NOT DETECTED NOT DETECTED Final   Klebsiella pneumoniae NOT DETECTED NOT DETECTED Final   Proteus species NOT DETECTED NOT DETECTED Final   Salmonella species NOT DETECTED NOT DETECTED Final   Serratia marcescens NOT DETECTED NOT DETECTED Final   Haemophilus influenzae NOT DETECTED NOT DETECTED Final   Neisseria meningitidis NOT DETECTED NOT DETECTED Final   Pseudomonas aeruginosa NOT DETECTED NOT DETECTED Final   Stenotrophomonas maltophilia NOT DETECTED NOT DETECTED Final   Candida albicans NOT DETECTED NOT DETECTED Final   Candida auris NOT DETECTED NOT DETECTED Final   Candida glabrata NOT DETECTED NOT DETECTED Final   Candida krusei NOT DETECTED NOT DETECTED Final   Candida parapsilosis NOT DETECTED NOT DETECTED Final   Candida tropicalis NOT DETECTED NOT DETECTED Final   Cryptococcus neoformans/gattii NOT DETECTED NOT DETECTED Final    Comment: Performed at [facility], [address] [place], [place] [address]  MRSA Next Gen by PCR, Nasal     Status: None   Collection Time: 06/20/21  6:27 PM   Specimen: Nasal Mucosa; Nasal Swab  Result Value Ref Range Status   MRSA by PCR Next Gen NOT DETECTED NOT DETECTED Final    Comment: (NOTE) The GeneXpert MRSA Assay (FDA approved for NASAL specimens only), is one component of a comprehensive  MRSA colonization surveillance program. It is not intended to diagnose MRSA infection nor to guide or monitor treatment for MRSA infections. Test performance is not FDA approved in patients less than 27 years old. Performed at [facility], [place] [address], [place], [place] [address]     RN Pressure Injury Documentation:     Estimated body mass index is 22.62 kg/m as calculated from the following:   Height as of this encounter: 5' 8"  (1.727 m).   Weight as of this encounter: 67.5 kg.  Malnutrition Type:  Nutrition Problem: Increased nutrient needs Etiology: acute illness, chronic illness   Malnutrition Characteristics:  Signs/Symptoms: estimated needs   Nutrition Interventions:  Interventions: Prostat, TPN, MVI     Radiology Studies: No results found.  Scheduled Meds:  (feeding supplement) PROSource Plus  30 mL Oral BID BM   acidophilus  1 capsule Oral Daily  amLODipine  10 mg Oral Daily   budesonide  9 mg Oral Daily   buPROPion ER  200 mg Oral Daily   calcium carbonate  500 mg Oral Daily   carvedilol  25 mg Oral BID WC   Chlorhexidine Gluconate Cloth  6 each Topical Daily   cholecalciferol  1,000 Units Oral Daily   cycloSPORINE  1 drop Both Eyes BID   DULoxetine  90 mg Oral Daily   enoxaparin (LOVENOX) injection  40 mg Subcutaneous Q24H   estradiol  2 mg Oral Daily   hydrALAZINE  100 mg Oral TID   insulin aspart  0-9 Units Subcutaneous Q6H   levETIRAcetam  500 mg Oral BID   lipase/protease/amylase  36,000 Units Oral TID WC   methadone  5 mg Oral 5 X Daily   methocarbamol  500 mg Oral BID   multivitamin with minerals   Oral Daily   pantoprazole  40 mg Oral Daily   polyvinyl alcohol  1 drop Both Eyes QID   potassium chloride  40 mEq Oral BID   sodium chloride flush  10-40 mL Intracatheter Q12H   sucralfate  1 g Oral TID WC & HS   Teduglutide (rDNA)  1.5 mg Subcutaneous Daily   vitamin B-12  1,000 mcg Oral Daily   Continuous  Infusions:  sodium chloride     sodium chloride 60 mL/hr at 06/23/21 0640   ceFEPime (MAXIPIME) IV 2 g (06/23/21 1124)    LOS: 3 days   Kerney Elbe, DO [facility] PAGER is on AMION  If 7PM-7AM, please contact night-coverage [email or web address]

## 2021-06-24 DIAGNOSIS — K219 Gastro-esophageal reflux disease without esophagitis: Secondary | ICD-10-CM | POA: Diagnosis not present

## 2021-06-24 DIAGNOSIS — A4159 Other Gram-negative sepsis: Secondary | ICD-10-CM | POA: Diagnosis not present

## 2021-06-24 DIAGNOSIS — R945 Abnormal results of liver function studies: Secondary | ICD-10-CM | POA: Diagnosis not present

## 2021-06-24 DIAGNOSIS — R7401 Elevation of levels of liver transaminase levels: Secondary | ICD-10-CM | POA: Diagnosis not present

## 2021-06-24 DIAGNOSIS — Z789 Other specified health status: Secondary | ICD-10-CM | POA: Diagnosis not present

## 2021-06-24 DIAGNOSIS — K50819 Crohn's disease of both small and large intestine with unspecified complications: Secondary | ICD-10-CM | POA: Diagnosis not present

## 2021-06-24 LAB — GLUCOSE, CAPILLARY
Glucose-Capillary: 107 mg/dL — ABNORMAL HIGH (ref 70–99)
Glucose-Capillary: 108 mg/dL — ABNORMAL HIGH (ref 70–99)
Glucose-Capillary: 92 mg/dL (ref 70–99)
Glucose-Capillary: 97 mg/dL (ref 70–99)
Glucose-Capillary: 99 mg/dL (ref 70–99)

## 2021-06-24 LAB — CBC WITH DIFFERENTIAL/PLATELET
Abs Immature Granulocytes: 0.01 10*3/uL (ref 0.00–0.07)
Basophils Absolute: 0 10*3/uL (ref 0.0–0.1)
Basophils Relative: 0 %
Eosinophils Absolute: 0.1 10*3/uL (ref 0.0–0.5)
Eosinophils Relative: 3 %
HCT: 24 % — ABNORMAL LOW (ref 36.0–46.0)
Hemoglobin: 7.8 g/dL — ABNORMAL LOW (ref 12.0–15.0)
Immature Granulocytes: 0 %
Lymphocytes Relative: 47 %
Lymphs Abs: 1.4 10*3/uL (ref 0.7–4.0)
MCH: 31.1 pg (ref 26.0–34.0)
MCHC: 32.5 g/dL (ref 30.0–36.0)
MCV: 95.6 fL (ref 80.0–100.0)
Monocytes Absolute: 0.3 10*3/uL (ref 0.1–1.0)
Monocytes Relative: 8 %
Neutro Abs: 1.3 10*3/uL — ABNORMAL LOW (ref 1.7–7.7)
Neutrophils Relative %: 42 %
Platelets: 154 10*3/uL (ref 150–400)
RBC: 2.51 MIL/uL — ABNORMAL LOW (ref 3.87–5.11)
RDW: 14.1 % (ref 11.5–15.5)
WBC: 3 10*3/uL — ABNORMAL LOW (ref 4.0–10.5)
nRBC: 0 % (ref 0.0–0.2)

## 2021-06-24 LAB — COMPREHENSIVE METABOLIC PANEL
ALT: 73 U/L — ABNORMAL HIGH (ref 0–44)
AST: 17 U/L (ref 15–41)
Albumin: 2.5 g/dL — ABNORMAL LOW (ref 3.5–5.0)
Alkaline Phosphatase: 153 U/L — ABNORMAL HIGH (ref 38–126)
Anion gap: 5 (ref 5–15)
BUN: 15 mg/dL (ref 6–20)
CO2: 22 mmol/L (ref 22–32)
Calcium: 8.6 mg/dL — ABNORMAL LOW (ref 8.9–10.3)
Chloride: 112 mmol/L — ABNORMAL HIGH (ref 98–111)
Creatinine, Ser: 1.43 mg/dL — ABNORMAL HIGH (ref 0.44–1.00)
GFR, Estimated: 42 mL/min — ABNORMAL LOW (ref >60)
Glucose, Bld: 97 mg/dL (ref 70–99)
Potassium: 3.4 mmol/L — ABNORMAL LOW (ref 3.5–5.1)
Sodium: 139 mmol/L (ref 135–145)
Total Bilirubin: 0.5 mg/dL (ref 0.3–1.2)
Total Protein: 5.7 g/dL — ABNORMAL LOW (ref 6.5–8.1)

## 2021-06-24 LAB — PHOSPHORUS: Phosphorus: 2.9 mg/dL (ref 2.5–4.6)

## 2021-06-24 LAB — MAGNESIUM: Magnesium: 1.7 mg/dL (ref 1.7–2.4)

## 2021-06-24 MED ORDER — POTASSIUM CHLORIDE CRYS ER 20 MEQ PO TBCR
40.0000 meq | EXTENDED_RELEASE_TABLET | Freq: Two times a day (BID) | ORAL | Status: AC
  Administered 2021-06-24 – 2021-06-25 (×2): 40 meq via ORAL
  Filled 2021-06-24 (×2): qty 2

## 2021-06-24 MED ORDER — MAGNESIUM SULFATE 2 GM/50ML IV SOLN
2.0000 g | Freq: Once | INTRAVENOUS | Status: AC
  Administered 2021-06-24: 2 g via INTRAVENOUS
  Filled 2021-06-24: qty 50

## 2021-06-24 NOTE — Plan of Care (Signed)
  Problem: Education: Goal: Knowledge of General Education information will improve Description: Including pain rating scale, medication(s)/side effects and non-pharmacologic comfort measures Outcome: Progressing   Problem: Education: Goal: Knowledge of General Education information will improve Description: Including pain rating scale, medication(s)/side effects and non-pharmacologic comfort measures Outcome: Progressing   Problem: Coping: Goal: Level of anxiety will decrease Outcome: Progressing   Problem: Elimination: Goal: Will not experience complications related to bowel motility Outcome: Progressing Goal: Will not experience complications related to urinary retention Outcome: Progressing   Problem: Pain Managment: Goal: General experience of comfort will improve Outcome: Progressing   Problem: Safety: Goal: Ability to remain free from injury will improve Outcome: Progressing   Problem: Skin Integrity: Goal: Risk for impaired skin integrity will decrease Outcome: Progressing

## 2021-06-24 NOTE — Progress Notes (Signed)
PROGRESS NOTE    Caitlin Peters  RFF:[id number] DOB: Mar 01, 1961 DOA: 06/19/2021 PCP: [name], [name], MD   Brief Narrative:  The patient is a 60 year old African-American female with a past medical history significant for but not limited to short gut syndrome requiring TPN, history of Crohn's disease with multiple surgeries, chronic pain syndrome, history of chronic PICC line for IV access as well as other comorbidities including chronic pancreatitis who presented to the ED on 06/19/2021 with fever and chills.  She reports that she has not felt well since 05/29/2021 when she was hospitalized late in June 2022.  Since leaving the hospital she has noticed pain in her left shoulder and arm.  She states that fever and chills began the day before admission and has been intermittent.  She reported diaphoresis at night and her fever at home was as high as 102.  She also admits that chronic abdominal pain located upper abdomen on the left side that radiates to the back that is 7 out of 10 and characterized as cramping and sharp as well as dull at times.  She states that her abdominal pain is alleviated by nothing and exacerbated by eating.  She had fevers and chills that were alleviated by nothing and exacerbated by nothing.  Associated symptoms include nausea and vomiting intermittently.  She states that she has chronic diarrhea more like Jell-O consistency but did not notice any blood and did not have any hematuria or dysuria.  She presented the ED and she is found to be febrile at 108 with a pulse rate of 104.  She was leukopenic and did have abnormal LFTs which are now improving.  Urinalysis showed no leukocytes or nitrites and cultures were drawn however.  She was placed on IV antibiotics with IV vancomycin and IV ceftriaxone and now changed to IV Cefepemime by ID given her GNR Bacteremia. Cx's are growing Acinetobacter Aldean Ast is responding to cefepime.  ID recommending continue IV cefepime while  hospitalized and possibly changing to p.o. ciprofloxacin at discharge.  They are recommending continuing her central access in place given her difficult access.  Pain was relatively uncontrolled so we will consult palliative care for further assistance with pain management.  Assessment & Plan:   Principal Problem:   SIRS (systemic inflammatory response syndrome) ([name]) Active Problems:   Crohn's disease of small & large intestine with SGS   Short gut syndrome   GERD (gastroesophageal reflux disease)   Chronic pain syndrome   On total parenteral nutrition (TPN)   LFTs abnormal   Pancytopenia ([place])  Sepsis 2/2 to Acinetobacter Ursingii Bacteremia, poA  -Present on admission and in the setting of GNR Bacteremia. Source: No known source of infection, but could have a PICC line infection or an abdominal infection.  -She does have a history of PICC line infections previously -Sepsis order set. Cultures ordered. And Blood Cx x2 showing GNR but Sensitivities Still pending  -Continued IV antibiotics: IV ceftriaxone, IV metronidazole, and IV vancomycin but now that she has a GNR bacteremia iD was consulted and changed her to IV Cefepime and recommending continuing IV cefepime while hospitalized and Possibly secondary to p.o. ciprofloxacin at discharge -She was given IV fluids to provide volume and remains on Prosource +30 mL p.o. twice daily and is being continued on Normal saline at 60 mL/h -Monitor for signs of volume depletion; monitor blood pressure carefully.  -Close monitoring of temperature curve and WBC; WBC has gone from 2.9 -> 3.8 -> 4.6 ->  3.5 -> 2.5 -> 3.2 -> 3.0 -Initial lactic acid was normal and repeat was also normal  -Given her Gram Negative Bacteremia ID feels no absolute indication for line exchange at this time -Appreciate ID Recc's   Pancytopenia [facility]) -May be related to SIRS. -WBC was 3.5, hemoglobin/hematocrit was 7.4/23.5, and platelet count was 114; -Now WBC is 3.0,  Hgb/Hct is 7.8/24.0 and Platelet Count is 154 -Continue monitor and trend and repeat CBC in a.m.   Short gut syndrome -With chronic TPN use.   -Holding home TPN given Bacteremia. -On IV fluids; May resume TPN once bacteremia is clearing and Ok with ID   Abnormal LFTs, improving  In the setting of TPN and Bacteremic infection -Patient's AST on admission was 544 is now trended down to 17 and patient's ALT was 365 and has now trended down to 73 -Monitor and trend LFTs and if necessary will obtain a right upper quadrant ultrasound as well as an acute hepatitis panel  GERD (gastroesophageal reflux disease) -Continue home medications with pantoprazole 40 mg p.o. daily as well as famotidine 20 mg p.o. daily as needed heartburn and indigestion -Also continue sucralfate 1 g p.o. 3 times daily  Hypokalemia -Patient's K+ is now 3.4 -Replete with po Kcl 40 mEQ BID x2 again  -Continue to Monitor and Replete as Necessary -Repeat CMP in the AM   Chronic pain syndrome -Continue home medication with methadone 5 mg p.o. 5 times daily as well as p.o. hydromorphone 4 mg every 4 hours as needed severe pain as well as IV hydromorphone 1 mg every 2 hours as needed severe pain -Continue with methocarbamol 500 g p.o. twice daily -She is still having significant Pain so will consult Palliative Care for further Symptom Management    Crohn's disease of the small and large intestine with short gut syndrome  -Continued home TPN but held given GNR bacteremia  -Continue with home budesonide 9 mg p.o. daily and with teduglutide 1.5 mg subcu daily for Short Gut -Followed by gastroenterology at [facility]  Left shoulder pain -Had an MRI towards the end of June and did not show any acute findings and is seeing her primary care provider -She has been referred to orthopedic surgery and has appointment August 1. -As above   AKI on chronic kidney disease stage IIIa Metabolic acidosis -Stable and improved.  Now BUN/Cr is  18/1.51 -> 15/1.43 Continue with IV fluid hydration as above -Patient had a small metabolic acidosis with a CO2 21, anion gap of 11, chloride level of 108; Now CO2 is 22, AG is 5, and Chloride Level is 112 -Avoid further nephrotoxic medications, contrast dyes, hypotension and renally dose medications Repeat CMP in a.m.  Depression and Anxiety -Continue with Bupropion ER 20 g p.o. daily and Duloxetine 90 mg p.o. daily  Hypertension -Continue to monitor blood pressures per protocol  -Continue with Amlodipine 10 mg p.o. daily as well as Carvedilol 25 mg p.o. twice daily -Continue to Monitor BP per Protocol -Last BP was 147/62  History of recurrent pancreatitis -Continue with Creon 36,000 units p.o. 3 times daily with meals  DVT prophylaxis: Enoxaparin 40 mg sq q24h Code Status: FULL CODE  Family Communication: No family present at bedside Disposition Plan: Pending further clinical improvement  Status is: Inpatient  Remains inpatient appropriate because:Unsafe d/c plan, IV treatments appropriate due to intensity of illness or inability to take PO, and Inpatient level of care appropriate due to severity of illness  Dispo:  Patient From: Home  Planned Disposition: Home with Health Care Svc  Medically stable for discharge: No    Consultants:  Infectious Diseases  Palliative Care Medicine   Procedures: None  Antimicrobials:  Anti-infectives (From admission, onward)    Start     Dose/Rate Route Frequency Ordered Stop   06/23/21 1600  ceFEPIme (MAXIPIME) 2 g in sodium chloride 0.9 % 100 mL IVPB  Status:  Discontinued        2 g 200 mL/hr over 30 Minutes Intravenous Every 12 hours 06/22/21 1452 06/23/21 0843   06/23/21 1000  ceFEPIme (MAXIPIME) 2 g in sodium chloride 0.9 % 100 mL IVPB        2 g 200 mL/hr over 30 Minutes Intravenous Every 12 hours 06/23/21 0843     06/22/21 1300  ceFEPIme (MAXIPIME) 2 g in sodium chloride 0.9 % 100 mL IVPB  Status:  Discontinued        2 g 200  mL/hr over 30 Minutes Intravenous Every 12 hours 06/22/21 1213 06/22/21 1452   06/20/21 2300  cefTRIAXone (ROCEPHIN) 2 g in sodium chloride 0.9 % 100 mL IVPB  Status:  Discontinued        2 g 200 mL/hr over 30 Minutes Intravenous Every 24 hours 06/20/21 0354 06/22/21 1213   06/20/21 2200  vancomycin (VANCOREADY) IVPB 750 mg/150 mL  Status:  Discontinued        750 mg 150 mL/hr over 60 Minutes Intravenous Every 24 hours 06/20/21 0152 06/22/21 1213   06/20/21 0400  metroNIDAZOLE (FLAGYL) IVPB 500 mg  Status:  Discontinued        500 mg 100 mL/hr over 60 Minutes Intravenous Every 8 hours 06/20/21 0354 06/22/21 1213   06/19/21 2345  cefTRIAXone (ROCEPHIN) 1 g in sodium chloride 0.9 % 100 mL IVPB        1 g 200 mL/hr over 30 Minutes Intravenous  Once 06/19/21 2341 06/20/21 0151   06/19/21 2300  cefTRIAXone (ROCEPHIN) 1 g in sodium chloride 0.9 % 100 mL IVPB        1 g 200 mL/hr over 30 Minutes Intravenous  Once 06/19/21 2253 06/20/21 0011   06/19/21 2300  vancomycin (VANCOCIN) IVPB 1000 mg/200 mL premix        1,000 mg 200 mL/hr over 60 Minutes Intravenous  Once 06/19/21 2253 06/20/21 0113        Subjective: Seen and examined at bedside thinks she is about 60% better but still had a lot of pain today that was worse than yesterday.  No nausea or vomiting.  Happy that her central line does not need to be pulled out.  No chest pain or shortness breath.  No other concerns voiced at this time.  Objective: Vitals:   06/24/21 0456 06/24/21 0458 06/24/21 0846 06/24/21 1353  BP: (!) 165/69  (!) 188/85 (!) 147/62  Pulse: 76  64 66  Resp: 12   20  Temp: 98.7 F (37.1 C)   100.2 F (37.9 C)  TempSrc: Oral   Oral  SpO2: 97%   99%  Weight:  70.9 kg    Height:        Intake/Output Summary (Last 24 hours) at 06/24/2021 1551 Last data filed at 06/24/2021 1400 Gross per 24 hour  Intake 1834.76 ml  Output --  Net 1834.76 ml    Filed Weights   06/21/21 0353 06/22/21 0540 06/24/21 0458   Weight: 64.8 kg 67.5 kg 70.9 kg   Examination: Physical Exam:  Constitutional: WN/WD chronically ill appearing  AAF in NAD and appears calm and comfortable Eyes: Lids and conjunctivae normal, sclerae anicteric  ENMT: External Ears, Nose appear normal. Grossly normal hearing.  Neck: Appears normal, supple, no cervical masses, normal ROM, no appreciable thyromegaly; no JVD Respiratory: Diminished to auscultation bilaterally, no wheezing, rales, rhonchi or crackles. Normal respiratory effort and patient is not tachypenic. No accessory muscle use. Unlabored Breathing  Cardiovascular: RRR, no murmurs / rubs / gallops. S1 and S2 auscultated. No extremity edema.  Abdomen: Soft, non-tender, slightly distended. Bowel sounds positive.  GU: Deferred. Musculoskeletal: No clubbing / cyanosis of digits/nails. No joint deformity upper and lower extremities.  Skin: No rashes, lesions, ulcers on a limited skin evaluation. No induration; Warm and dry.  Neurologic: CN 2-12 grossly intact with no focal deficits. Romberg sign and cerebellar reflexes not assessed.  Psychiatric: Normal judgment and insight. Alert and oriented x 3. Normal mood and appropriate affect.   Data Reviewed: I have personally reviewed following labs and imaging studies  CBC: Recent Labs  Lab 06/19/21 2330 06/20/21 0454 06/21/21 0517 06/22/21 0320 06/23/21 0413 06/24/21 0402  WBC 3.8* 4.6 3.5* 2.5* 3.2* 3.0*  NEUTROABS 3.2  --  2.0 1.2* 1.4* 1.3*  HGB 7.2* 7.4* 7.4* 7.3* 7.6* 7.8*  HCT 21.8* 23.2* 23.5* 23.5* 23.8* 24.0*  MCV 92.4 93.5 97.5 97.9 96.0 95.6  PLT 110* 117* 114* 129* 121* 485    Basic Metabolic Panel: Recent Labs  Lab 06/20/21 0454 06/21/21 0517 06/22/21 0320 06/23/21 0413 06/24/21 0402  NA 139 140 139 138 139  K 3.9 3.9 3.6 3.1* 3.4*  CL 111 108 112* 111 112*  CO2 24 21* 23 22 22   GLUCOSE 113* 88 117* 101* 97  BUN 37* 24* 21* 18 15  CREATININE 1.46* 1.34* 1.58* 1.51* 1.43*  CALCIUM 8.4* 8.9 8.6*  8.4* 8.6*  MG  --  1.9 1.9 1.8 1.7  PHOS  --  4.0 3.6 3.5 2.9    GFR: Estimated Creatinine Clearance: 42.2 mL/min (A) (by C-G formula based on SCr of 1.43 mg/dL (H)). Liver Function Tests: Recent Labs  Lab 06/20/21 0454 06/21/21 0517 06/22/21 0320 06/23/21 0413 06/24/21 0402  AST 282* 91* 40 22 17  ALT 313* 197* 137* 93* 73*  ALKPHOS 230* 181* 167* 154* 153*  BILITOT 1.0 0.6 0.5 0.5 0.5  PROT 5.7* 5.5* 5.6* 5.5* 5.7*  ALBUMIN 2.5* 2.4* 2.3* 2.4* 2.5*    Recent Labs  Lab 06/19/21 2330  LIPASE 30    No results for input(s): AMMONIA in the last 168 hours. Coagulation Profile: Recent Labs  Lab 06/19/21 2145  INR 1.0    Cardiac Enzymes: No results for input(s): CKTOTAL, CKMB, CKMBINDEX, TROPONINI in the last 168 hours. BNP (last 3 results) No results for input(s): PROBNP in the last 8760 hours. HbA1C: No results for input(s): HGBA1C in the last 72 hours. CBG: Recent Labs  Lab 06/23/21 1601 06/24/21 0006 06/24/21 0614 06/24/21 0731 06/24/21 1129  GLUCAP 107* 107* 97 99 92    Lipid Profile: No results for input(s): CHOL, HDL, LDLCALC, TRIG, CHOLHDL, LDLDIRECT in the last 72 hours.  Thyroid Function Tests: No results for input(s): TSH, T4TOTAL, FREET4, T3FREE, THYROIDAB in the last 72 hours. Anemia Panel: No results for input(s): VITAMINB12, FOLATE, FERRITIN, TIBC, IRON, RETICCTPCT in the last 72 hours. Sepsis Labs: Recent Labs  Lab 06/19/21 2145 06/19/21 2330 06/20/21 0454  LATICACIDVEN 0.6 0.6 0.5    Recent Results (from the past 240 hour(s))  Resp Panel by RT-PCR (Flu  A&B, Covid) Nasopharyngeal Swab     Status: None   Collection Time: 06/19/21  9:20 PM   Specimen: Nasopharyngeal Swab; Nasopharyngeal(NP) swabs in vial transport medium  Result Value Ref Range Status   SARS Coronavirus 2 by RT PCR NEGATIVE NEGATIVE Final    Comment: (NOTE) SARS-CoV-2 target nucleic acids are NOT DETECTED.  The SARS-CoV-2 RNA is generally detectable in upper  respiratory specimens during the acute phase of infection. The lowest concentration of SARS-CoV-2 viral copies this assay can detect is 138 copies/mL. A negative result does not preclude SARS-Cov-2 infection and should not be used as the sole basis for treatment or other patient management decisions. A negative result may occur with  improper specimen collection/handling, submission of specimen other than nasopharyngeal swab, presence of viral mutation(s) within the areas targeted by this assay, and inadequate number of viral copies(<138 copies/mL). A negative result must be combined with clinical observations, patient history, and epidemiological information. The expected result is Negative.  Fact Sheet for Patients:  EntrepreneurPulse.com.au  Fact Sheet for Healthcare Providers:  IncredibleEmployment.be  This test is no t yet approved or cleared by the [place] FDA and  has been authorized for detection and/or diagnosis of SARS-CoV-2 by FDA under an Emergency Use Authorization (EUA). This EUA will remain  in effect (meaning this test can be used) for the duration of the COVID-19 declaration under Section 564(b)(1) of the Act, 21 U.S.C.section 360bbb-3(b)(1), unless the authorization is terminated  or revoked sooner.       Influenza A by PCR NEGATIVE NEGATIVE Final   Influenza B by PCR NEGATIVE NEGATIVE Final    Comment: (NOTE) The Xpert Xpress SARS-CoV-2/FLU/RSV plus assay is intended as an aid in the diagnosis of influenza from Nasopharyngeal swab specimens and should not be used as a sole basis for treatment. Nasal washings and aspirates are unacceptable for Xpert Xpress SARS-CoV-2/FLU/RSV testing.  Fact Sheet for Patients: EntrepreneurPulse.com.au  Fact Sheet for Healthcare Providers: IncredibleEmployment.be  This test is not yet approved or cleared by the [place] FDA and has been  authorized for detection and/or diagnosis of SARS-CoV-2 by FDA under an Emergency Use Authorization (EUA). This EUA will remain in effect (meaning this test can be used) for the duration of the COVID-19 declaration under Section 564(b)(1) of the Act, 21 U.S.C. section 360bbb-3(b)(1), unless the authorization is terminated or revoked.  Performed at [facility], [place] [address], [place], [place] [address]   Urine Culture     Status: Abnormal   Collection Time: 06/19/21  9:45 PM   Specimen: In/Out Cath Urine  Result Value Ref Range Status   Specimen Description   Final    IN/OUT CATH URINE Performed at [place] [address] [place], [place] [address]    Special Requests   Final    NONE Performed at [facility], [place] [address], [place], [place] [address]    Culture (A)  Final    4,000 COLONIES/mL STAPHYLOCOCCUS EPIDERMIDIS 10,000 COLONIES/mL LACTOBACILLUS SPECIES Standardized susceptibility testing for this organism is not available. Performed at [facility], [place] [address] [place], [place] [address]    Report Status 06/21/2021 FINAL  Final  Blood Culture (routine x 2)     Status: Abnormal (Preliminary result)   Collection Time: 06/19/21  9:45 PM   Specimen: BLOOD  Result Value Ref Range Status   Specimen Description   Final    BLOOD RIGHT ANTECUBITAL Performed at [facility], [place] [address] [place], [place] [address]    Special Requests   Final    BOTTLES DRAWN AEROBIC AND ANAEROBIC Blood Culture adequate volume Performed at [place] [address] [place], [place] [address]    Culture  Setup Time   Final    GRAM NEGATIVE RODS AEROBIC BOTTLE ONLY CRITICAL RESULT CALLED TO, READ BACK BY AND VERIFIED WITH: PHARMD M TUCKER [id number] FCP CRITICAL RESULT CALLED TO, READ BACK BY AND VERIFIED WITH: pharmd Dillard [place] 06/22/21@11 :25 by tw    Culture (A)  Final     ACINETOBACTER URSINGII CRITICAL RESULT CALLED TO, READ BACK BY AND VERIFIED WITH: Trude Mcburney [phone] [id number] FCP Performed at [facility], [place] [address], [place], [place] [address]    Report Status PENDING  Incomplete   Organism ID, Bacteria ACINETOBACTER URSINGII  Final      Susceptibility   Acinetobacter ursingii - MIC*    CEFTAZIDIME 8 SENSITIVE Sensitive     CIPROFLOXACIN <=0.25 SENSITIVE Sensitive     GENTAMICIN <=1 SENSITIVE Sensitive     IMIPENEM <=0.25 SENSITIVE Sensitive     TRIMETH/SULFA <=20 SENSITIVE Sensitive     AMPICILLIN/SULBACTAM <=2 SENSITIVE Sensitive     * ACINETOBACTER URSINGII  Blood Culture ID Panel (Reflexed)     Status: None   Collection Time: 06/19/21  9:45 PM  Result Value Ref Range Status   Enterococcus faecalis NOT DETECTED NOT DETECTED Final   Enterococcus Faecium NOT DETECTED NOT DETECTED Final   Listeria monocytogenes NOT DETECTED NOT DETECTED Final   Staphylococcus species NOT DETECTED NOT DETECTED Final   Staphylococcus aureus (BCID) NOT DETECTED NOT DETECTED Final   Staphylococcus epidermidis NOT DETECTED NOT DETECTED Final   Staphylococcus lugdunensis NOT DETECTED NOT DETECTED Final   Streptococcus species NOT DETECTED NOT DETECTED Final   Streptococcus agalactiae NOT DETECTED NOT DETECTED Final   Streptococcus pneumoniae NOT DETECTED NOT DETECTED Final   Streptococcus pyogenes NOT DETECTED NOT DETECTED Final   A.calcoaceticus-baumannii NOT DETECTED NOT DETECTED Final   Bacteroides fragilis NOT DETECTED NOT DETECTED Final   Enterobacterales NOT DETECTED NOT DETECTED Final   Enterobacter cloacae complex NOT DETECTED NOT DETECTED Final   Escherichia coli NOT DETECTED NOT DETECTED Final   Klebsiella aerogenes NOT DETECTED NOT DETECTED Final   Klebsiella oxytoca NOT DETECTED NOT DETECTED Final   Klebsiella pneumoniae NOT DETECTED NOT DETECTED Final   Proteus species NOT DETECTED NOT DETECTED Final   Salmonella species NOT DETECTED NOT  DETECTED Final   Serratia marcescens NOT DETECTED NOT DETECTED Final   Haemophilus influenzae NOT DETECTED NOT DETECTED Final   Neisseria meningitidis NOT DETECTED NOT DETECTED Final   Pseudomonas aeruginosa NOT DETECTED NOT DETECTED Final   Stenotrophomonas maltophilia NOT DETECTED NOT DETECTED Final   Candida albicans NOT DETECTED NOT DETECTED Final   Candida auris NOT DETECTED NOT DETECTED Final   Candida glabrata NOT DETECTED NOT DETECTED Final   Candida krusei NOT DETECTED NOT DETECTED Final   Candida parapsilosis NOT DETECTED NOT DETECTED Final   Candida tropicalis NOT DETECTED NOT DETECTED Final   Cryptococcus neoformans/gattii NOT DETECTED NOT DETECTED Final    Comment: Performed at [facility], [place]. [address] [place], [place] [address]  MRSA Next Gen by PCR, Nasal     Status: None   Collection Time: 06/20/21  6:27 PM   Specimen: Nasal Mucosa; Nasal Swab  Result Value Ref Range Status   MRSA by PCR Next Gen NOT  DETECTED NOT DETECTED Final    Comment: (NOTE) The GeneXpert MRSA Assay (FDA approved for NASAL specimens only), is one component of a comprehensive MRSA colonization surveillance program. It is not intended to diagnose MRSA infection nor to guide or monitor treatment for MRSA infections. Test performance is not FDA approved in patients less than 84 years old. Performed at [facility], [place] [address], [place], [place] [address]     RN Pressure Injury Documentation:     Estimated body mass index is 23.77 kg/m as calculated from the following:   Height as of this encounter: 5' 8"  (1.727 m).   Weight as of this encounter: 70.9 kg.  Malnutrition Type:  Nutrition Problem: Increased nutrient needs Etiology: acute illness, chronic illness   Malnutrition Characteristics:  Signs/Symptoms: estimated needs   Nutrition Interventions:  Interventions: Prostat, TPN, MVI     Radiology Studies: No results found.  Scheduled Meds:   (feeding supplement) PROSource Plus  30 mL Oral BID BM   acidophilus  1 capsule Oral Daily   amLODipine  10 mg Oral Daily   budesonide  9 mg Oral Daily   buPROPion ER  200 mg Oral Daily   calcium carbonate  500 mg Oral Daily   carvedilol  25 mg Oral BID WC   Chlorhexidine Gluconate Cloth  6 each Topical Daily   cholecalciferol  1,000 Units Oral Daily   cycloSPORINE  1 drop Both Eyes BID   DULoxetine  90 mg Oral Daily   enoxaparin (LOVENOX) injection  40 mg Subcutaneous Q24H   estradiol  2 mg Oral Daily   hydrALAZINE  100 mg Oral TID   insulin aspart  0-9 Units Subcutaneous Q6H   levETIRAcetam  500 mg Oral BID   lipase/protease/amylase  36,000 Units Oral TID WC   methadone  5 mg Oral 5 X Daily   methocarbamol  500 mg Oral BID   multivitamin with minerals   Oral Daily   pantoprazole  40 mg Oral Daily   polyvinyl alcohol  1 drop Both Eyes QID   sodium chloride flush  10-40 mL Intracatheter Q12H   sucralfate  1 g Oral TID WC & HS   Teduglutide (rDNA)  1.5 mg Subcutaneous Daily   vitamin B-12  1,000 mcg Oral Daily   Continuous Infusions:  sodium chloride     sodium chloride 60 mL/hr at 06/24/21 0033   ceFEPime (MAXIPIME) IV 2 g (06/24/21 0847)    LOS: 4 days   Kerney Elbe, DO [facility] PAGER is on [place]  If 7PM-7AM, please contact night-coverage [email or web address]

## 2021-06-24 NOTE — Progress Notes (Signed)
Chart check   Patient with bcx/line related infection growing non-baumanii acinetobacter Responding to cefepime  Central line being kept due to difficult access   Acinetobacter species sensitive to cipro Aki on ckd with renal funciton improving    -continue cefepime for now while in house -I will wait until tomorrow; if renal function improves potentially could send out on PO cipro

## 2021-06-25 DIAGNOSIS — K219 Gastro-esophageal reflux disease without esophagitis: Secondary | ICD-10-CM | POA: Diagnosis not present

## 2021-06-25 DIAGNOSIS — K50819 Crohn's disease of both small and large intestine with unspecified complications: Secondary | ICD-10-CM | POA: Diagnosis not present

## 2021-06-25 DIAGNOSIS — A4159 Other Gram-negative sepsis: Secondary | ICD-10-CM | POA: Diagnosis not present

## 2021-06-25 DIAGNOSIS — R7401 Elevation of levels of liver transaminase levels: Secondary | ICD-10-CM | POA: Diagnosis not present

## 2021-06-25 DIAGNOSIS — R945 Abnormal results of liver function studies: Secondary | ICD-10-CM | POA: Diagnosis not present

## 2021-06-25 DIAGNOSIS — Z789 Other specified health status: Secondary | ICD-10-CM | POA: Diagnosis not present

## 2021-06-25 LAB — COMPREHENSIVE METABOLIC PANEL
ALT: 57 U/L — ABNORMAL HIGH (ref 0–44)
AST: 14 U/L — ABNORMAL LOW (ref 15–41)
Albumin: 2.6 g/dL — ABNORMAL LOW (ref 3.5–5.0)
Alkaline Phosphatase: 132 U/L — ABNORMAL HIGH (ref 38–126)
Anion gap: 5 (ref 5–15)
BUN: 14 mg/dL (ref 6–20)
CO2: 23 mmol/L (ref 22–32)
Calcium: 9.1 mg/dL (ref 8.9–10.3)
Chloride: 112 mmol/L — ABNORMAL HIGH (ref 98–111)
Creatinine, Ser: 1.4 mg/dL — ABNORMAL HIGH (ref 0.44–1.00)
GFR, Estimated: 43 mL/min — ABNORMAL LOW (ref >60)
Glucose, Bld: 81 mg/dL (ref 70–99)
Potassium: 3.2 mmol/L — ABNORMAL LOW (ref 3.5–5.1)
Sodium: 140 mmol/L (ref 135–145)
Total Bilirubin: 0.6 mg/dL (ref 0.3–1.2)
Total Protein: 5.7 g/dL — ABNORMAL LOW (ref 6.5–8.1)

## 2021-06-25 LAB — CULTURE, BLOOD (ROUTINE X 2): Special Requests: ADEQUATE

## 2021-06-25 LAB — CBC WITH DIFFERENTIAL/PLATELET
Abs Immature Granulocytes: 0.01 10*3/uL (ref 0.00–0.07)
Basophils Absolute: 0 10*3/uL (ref 0.0–0.1)
Basophils Relative: 0 %
Eosinophils Absolute: 0.1 10*3/uL (ref 0.0–0.5)
Eosinophils Relative: 2 %
HCT: 23.6 % — ABNORMAL LOW (ref 36.0–46.0)
Hemoglobin: 7.6 g/dL — ABNORMAL LOW (ref 12.0–15.0)
Immature Granulocytes: 0 %
Lymphocytes Relative: 44 %
Lymphs Abs: 1.5 10*3/uL (ref 0.7–4.0)
MCH: 30.2 pg (ref 26.0–34.0)
MCHC: 32.2 g/dL (ref 30.0–36.0)
MCV: 93.7 fL (ref 80.0–100.0)
Monocytes Absolute: 0.2 10*3/uL (ref 0.1–1.0)
Monocytes Relative: 7 %
Neutro Abs: 1.5 10*3/uL — ABNORMAL LOW (ref 1.7–7.7)
Neutrophils Relative %: 47 %
Platelets: 168 10*3/uL (ref 150–400)
RBC: 2.52 MIL/uL — ABNORMAL LOW (ref 3.87–5.11)
RDW: 13.9 % (ref 11.5–15.5)
WBC: 3.3 10*3/uL — ABNORMAL LOW (ref 4.0–10.5)
nRBC: 0 % (ref 0.0–0.2)

## 2021-06-25 LAB — PHOSPHORUS: Phosphorus: 2.6 mg/dL (ref 2.5–4.6)

## 2021-06-25 LAB — GLUCOSE, CAPILLARY
Glucose-Capillary: 107 mg/dL — ABNORMAL HIGH (ref 70–99)
Glucose-Capillary: 124 mg/dL — ABNORMAL HIGH (ref 70–99)
Glucose-Capillary: 94 mg/dL (ref 70–99)

## 2021-06-25 LAB — MAGNESIUM: Magnesium: 2.1 mg/dL (ref 1.7–2.4)

## 2021-06-25 MED ORDER — POTASSIUM CHLORIDE CRYS ER 20 MEQ PO TBCR
40.0000 meq | EXTENDED_RELEASE_TABLET | Freq: Four times a day (QID) | ORAL | Status: AC
Start: 2021-06-25 — End: 2021-06-25
  Administered 2021-06-25 (×2): 40 meq via ORAL
  Filled 2021-06-25 (×2): qty 2

## 2021-06-25 MED ORDER — CIPROFLOXACIN HCL 500 MG PO TABS
750.0000 mg | ORAL_TABLET | Freq: Every day | ORAL | Status: DC
  Administered 2021-06-25: 750 mg via ORAL
  Filled 2021-06-25: qty 1

## 2021-06-25 NOTE — Progress Notes (Signed)
Notified on call provider about patient's BP being 177/66. Rechecked patient's BP it was 160/70. On call provider gave order to go ahead and give scheduled 8 am Coreg medication. Passed this information along to dayshift.

## 2021-06-25 NOTE — Plan of Care (Signed)
  Problem: Education: Goal: Knowledge of General Education information will improve Description: Including pain rating scale, medication(s)/side effects and non-pharmacologic comfort measures Outcome: Progressing   Problem: Activity: Goal: Risk for activity intolerance will decrease Outcome: Progressing   Problem: Coping: Goal: Level of anxiety will decrease Outcome: Progressing   Problem: Elimination: Goal: Will not experience complications related to bowel motility Outcome: Progressing Goal: Will not experience complications related to urinary retention Outcome: Progressing   Problem: Pain Managment: Goal: General experience of comfort will improve Outcome: Progressing   Problem: Safety: Goal: Ability to remain free from injury will improve Outcome: Progressing   Problem: Skin Integrity: Goal: Risk for impaired skin integrity will decrease Outcome: Progressing

## 2021-06-25 NOTE — Progress Notes (Signed)
PROGRESS NOTE    Caitlin Peters  UKG:[id number] DOB: [birth year] DOA: 06/19/2021 PCP: [name], [name], MD   Brief Narrative:  The patient is a 60 year old African-American female with a past medical history significant for but not limited to short gut syndrome requiring TPN, history of Crohn's disease with multiple surgeries, chronic pain syndrome, history of chronic PICC line for IV access as well as other comorbidities including chronic pancreatitis who presented to the ED on 06/19/2021 with fever and chills.  She reports that she has not felt well since 05/29/2021 when she was hospitalized late in June 2022.  Since leaving the hospital she has noticed pain in her left shoulder and arm.  She states that fever and chills began the day before admission and has been intermittent.  She reported diaphoresis at night and her fever at home was as high as 102.  She also admits that chronic abdominal pain located upper abdomen on the left side that radiates to the back that is 7 out of 10 and characterized as cramping and sharp as well as dull at times.  She states that her abdominal pain is alleviated by nothing and exacerbated by eating.  She had fevers and chills that were alleviated by nothing and exacerbated by nothing.  Associated symptoms include nausea and vomiting intermittently.  She states that she has chronic diarrhea more like Jell-O consistency but did not notice any blood and did not have any hematuria or dysuria.  She presented the ED and she is found to be febrile at 108 with a pulse rate of 104.  She was leukopenic and did have abnormal LFTs which are now improving.  Urinalysis showed no leukocytes or nitrites and cultures were drawn however.  She was placed on IV antibiotics with IV vancomycin and IV ceftriaxone and now changed to IV Cefepemime by ID given her GNR Bacteremia. Cx's are growing Acinetobacter Aldean Ast is responding to cefepime.  ID recommending continue IV cefepime while  hospitalized and possibly changing to p.o. ciprofloxacin at discharge.  They are recommending continuing her central access in place given her difficult access.  Pain was relatively uncontrolled so we will consult palliative care for further assistance with pain management.  She had low-grade temperatures overnight so ID repeating blood cultures  Assessment & Plan:   Principal Problem:   SIRS (systemic inflammatory response syndrome) ([name]) Active Problems:   Crohn's disease of small & large intestine with SGS   Short gut syndrome   GERD (gastroesophageal reflux disease)   Chronic pain syndrome   On total parenteral nutrition (TPN)   LFTs abnormal   Pancytopenia ([place])  Sepsis 2/2 to Acinetobacter Ursingii Bacteremia, poA  -Present on admission and in the setting of GNR Bacteremia. Source: No known source of infection, but could have a PICC line infection or an abdominal infection.  -She does have a history of PICC line infections previously -Sepsis order set. Cultures ordered. And Blood Cx x2 showing GNR but Sensitivities as above -Continued IV antibiotics: IV ceftriaxone, IV metronidazole, and IV vancomycin but now that she has a GNR bacteremia iD was consulted and changed her to IV Cefepime and recommending continuing IV cefepime while hospitalized but have not changed her to p.o. ciprofloxacin 750 mg recommending continuing at discharge until 07/04/2021 -She was given IV fluids to provide volume and remains on Prosource +30 mL p.o. twice daily and is being continued on Normal saline at 60 mL/h -Monitor for signs of volume depletion; monitor blood pressure carefully.  -  Close monitoring of temperature curve and WBC; WBC has gone from 2.9 -> 3.8 -> 4.6 -> 3.5 -> 2.5 -> 3.2 -> 3.0 and is now 3.3 -Initial lactic acid was normal and repeat was also normal  -Because she had low-grade temperatures with a T-max of 100.2 ID is recommending repeating blood cultures and this has been done -Given her  Gram Negative Bacteremia ID feels no absolute indication for line exchange at this time but if she is not clearing her bacteremia then ID recommends removal -Appreciate ID Recc's   Pancytopenia [facility]) -May be related to SIRS. -WBC was 3.5, hemoglobin/hematocrit was 7.4/23.5, and platelet count was 114; -Now WBC is 3.3, Hgb/Hct is 7.6/20.6 and Platelet Count is improved and now at 168 -Continue monitor and trend and repeat CBC in a.m.   Short gut syndrome -With chronic TPN use.   -Holding home TPN given Bacteremia. -On IV fluids; May resume TPN once bacteremia is clearing and Ok with ID   Abnormal LFTs, improving  In the setting of TPN and Bacteremic infection -Patient's AST on admission was 544 is now trended down to 14 and patient's ALT was 365 and has now trended down to 57 -Monitor and trend LFTs and if necessary will obtain a right upper quadrant ultrasound as well as an acute hepatitis panel  GERD (gastroesophageal reflux disease) -Continue home medications with pantoprazole 40 mg p.o. daily as well as famotidine 20 mg p.o. daily as needed heartburn and indigestion -Also continue sucralfate 1 g p.o. 3 times daily  Hypokalemia -Patient's K+ is now 3.2 -Replete with po Kcl 40 mEQ BID x2 again  -Continue to Monitor and Replete as Necessary -Repeat CMP in the AM   Chronic pain syndrome -Continue home medication with methadone 5 mg p.o. 5 times daily as well as p.o. hydromorphone 4 mg every 4 hours as needed severe pain as well as IV hydromorphone 1 mg every 2 hours as needed severe pain -She has used 46 IV Doses of Hydromorphone given her pain so far -Continue with methocarbamol 500 g p.o. twice daily -She is still having significant Pain so will consult Palliative Care for further Symptom Management; palliative care consultation is still pending    Crohn's disease of the small and large intestine with short gut syndrome  -Continued home TPN but held given GNR bacteremia from  Acetobacter -Continue with home budesonide 9 mg p.o. daily and with teduglutide 1.5 mg subcu daily for Short Gut -Followed by gastroenterology at [facility]  Left shoulder pain -Had an MRI towards the end of June and did not show any acute findings and is seeing her primary care provider -She has been referred to orthopedic surgery and has appointment August 1. -As above; continues to have some left shoulder pain  AKI on chronic kidney disease stage IIIa Metabolic acidosis -Stable and improved.  Now BUN/Cr is 18/1.51 -> 15/1.43 -> 14/1.40 Continue with IV fluid hydration as above -Patient had a small metabolic acidosis with a CO2 21, anion gap of 11, chloride level of 108; Now CO2 is 22, AG is 5, and Chloride Level is 112 -Avoid further nephrotoxic medications, contrast dyes, hypotension and renally dose medications Repeat CMP in a.m.  Depression and Anxiety -Continue with Bupropion ER 20 g p.o. daily and Duloxetine 90 mg p.o. daily  Hypertension -Continue to monitor blood pressures per protocol  -Continue with Amlodipine 10 mg p.o. daily as well as Carvedilol 25 mg p.o. twice daily -Continue to Monitor BP per Protocol -Last BP  was 160/73  History of Recurrent Pancreatitis -Continue with Creon 36,000 units p.o. 3 times daily with meals  DVT prophylaxis: Enoxaparin 40 mg sq q24h Code Status: FULL CODE  Family Communication: No family present at bedside Disposition Plan: Pending further clinical improvement and clearance by ID and Pain has to be controlled   Status is: Inpatient  Remains inpatient appropriate because:Unsafe d/c plan, IV treatments appropriate due to intensity of illness or inability to take PO, and Inpatient level of care appropriate due to severity of illness  Dispo:  Patient From: Home  Planned Disposition: Home with Health Care Svc  Medically stable for discharge: No    Consultants:  Infectious Diseases  Palliative Care Medicine   Procedures:  None  Antimicrobials:  Anti-infectives (From admission, onward)    Start     Dose/Rate Route Frequency Ordered Stop   06/25/21 1400  ciprofloxacin (CIPRO) tablet 750 mg        750 mg Oral Daily 06/25/21 1218     06/23/21 1600  ceFEPIme (MAXIPIME) 2 g in sodium chloride 0.9 % 100 mL IVPB  Status:  Discontinued        2 g 200 mL/hr over 30 Minutes Intravenous Every 12 hours 06/22/21 1452 06/23/21 0843   06/23/21 1000  ceFEPIme (MAXIPIME) 2 g in sodium chloride 0.9 % 100 mL IVPB  Status:  Discontinued        2 g 200 mL/hr over 30 Minutes Intravenous Every 12 hours 06/23/21 0843 06/25/21 1218   06/22/21 1300  ceFEPIme (MAXIPIME) 2 g in sodium chloride 0.9 % 100 mL IVPB  Status:  Discontinued        2 g 200 mL/hr over 30 Minutes Intravenous Every 12 hours 06/22/21 1213 06/22/21 1452   06/20/21 2300  cefTRIAXone (ROCEPHIN) 2 g in sodium chloride 0.9 % 100 mL IVPB  Status:  Discontinued        2 g 200 mL/hr over 30 Minutes Intravenous Every 24 hours 06/20/21 0354 06/22/21 1213   06/20/21 2200  vancomycin (VANCOREADY) IVPB 750 mg/150 mL  Status:  Discontinued        750 mg 150 mL/hr over 60 Minutes Intravenous Every 24 hours 06/20/21 0152 06/22/21 1213   06/20/21 0400  metroNIDAZOLE (FLAGYL) IVPB 500 mg  Status:  Discontinued        500 mg 100 mL/hr over 60 Minutes Intravenous Every 8 hours 06/20/21 0354 06/22/21 1213   06/19/21 2345  cefTRIAXone (ROCEPHIN) 1 g in sodium chloride 0.9 % 100 mL IVPB        1 g 200 mL/hr over 30 Minutes Intravenous  Once 06/19/21 2341 06/20/21 0151   06/19/21 2300  cefTRIAXone (ROCEPHIN) 1 g in sodium chloride 0.9 % 100 mL IVPB        1 g 200 mL/hr over 30 Minutes Intravenous  Once 06/19/21 2253 06/20/21 0011   06/19/21 2300  vancomycin (VANCOCIN) IVPB 1000 mg/200 mL premix        1,000 mg 200 mL/hr over 60 Minutes Intravenous  Once 06/19/21 2253 06/20/21 0113        Subjective: Seen and examined at bedside and still having quite a bit of pain  specifically in her left shoulder.  Denies any nausea or vomiting.  Feels okay but still feels rough and uncomfortable.  No other concerns or complaints at this time.  Objective: Vitals:   06/25/21 0435 06/25/21 0555 06/25/21 0840 06/25/21 1307  BP: (!) 177/66 (!) 160/70 (!) 186/84 (!) 160/73  Pulse: 68  63 68  Resp: 20   16  Temp: 99 F (37.2 C)   98.1 F (36.7 C)  TempSrc: Oral   Oral  SpO2: 98%   99%  Weight: 66.1 kg     Height: 5' 8"  (1.727 m)       Intake/Output Summary (Last 24 hours) at 06/25/2021 1439 Last data filed at 06/25/2021 1007 Gross per 24 hour  Intake 1106.23 ml  Output 1500 ml  Net -393.77 ml    Filed Weights   06/22/21 0540 06/24/21 0458 06/25/21 0435  Weight: 67.5 kg 70.9 kg 66.1 kg   Examination: Physical Exam:  Constitutional: WN/WD chronically ill-appearing African-American female in mild distress appears calm but does appear uncomfortable complaining of pain Eyes: Lids and conjunctivae normal, sclerae anicteric  ENMT: External Ears, Nose appear normal. Grossly normal hearing.  Neck: Appears normal, supple, no cervical masses, normal ROM, no appreciable thyromegaly; no JVD Respiratory: Clear to auscultation bilaterally, no wheezing, rales, rhonchi or crackles. Normal respiratory effort and patient is not tachypenic. No accessory muscle use.  Cardiovascular: RRR, no murmurs / rubs / gallops. S1 and S2 auscultated. No extremity edema. Abdomen: Soft, non-tender, Distended slightly. Bowel sounds positive.  GU: Deferred. Musculoskeletal: No clubbing / cyanosis of digits/nails. No joint deformity upper and lower extremities.  Skin: No rashes, lesions, ulcers limited skin evaluation does have a abdominal scar from her prior multiple surgeries. No induration; Warm and dry.  Neurologic: CN 2-12 grossly intact with no focal deficits. Romberg sign and cerebellar reflexes not assessed.  Psychiatric: Normal judgment and insight. Alert and oriented x 3. Normal mood  and appropriate affect.   Data Reviewed: I have personally reviewed following labs and imaging studies  CBC: Recent Labs  Lab 06/21/21 0517 06/22/21 0320 06/23/21 0413 06/24/21 0402 06/25/21 0325  WBC 3.5* 2.5* 3.2* 3.0* 3.3*  NEUTROABS 2.0 1.2* 1.4* 1.3* 1.5*  HGB 7.4* 7.3* 7.6* 7.8* 7.6*  HCT 23.5* 23.5* 23.8* 24.0* 23.6*  MCV 97.5 97.9 96.0 95.6 93.7  PLT 114* 129* 121* 154 034    Basic Metabolic Panel: Recent Labs  Lab 06/21/21 0517 06/22/21 0320 06/23/21 0413 06/24/21 0402 06/25/21 0325  NA 140 139 138 139 140  K 3.9 3.6 3.1* 3.4* 3.2*  CL 108 112* 111 112* 112*  CO2 21* 23 22 22 23   GLUCOSE 88 117* 101* 97 81  BUN 24* 21* 18 15 14   CREATININE 1.34* 1.58* 1.51* 1.43* 1.40*  CALCIUM 8.9 8.6* 8.4* 8.6* 9.1  MG 1.9 1.9 1.8 1.7 2.1  PHOS 4.0 3.6 3.5 2.9 2.6    GFR: Estimated Creatinine Clearance: 43.1 mL/min (A) (by C-G formula based on SCr of 1.4 mg/dL (H)). Liver Function Tests: Recent Labs  Lab 06/21/21 0517 06/22/21 0320 06/23/21 0413 06/24/21 0402 06/25/21 0325  AST 91* 40 22 17 14*  ALT 197* 137* 93* 73* 57*  ALKPHOS 181* 167* 154* 153* 132*  BILITOT 0.6 0.5 0.5 0.5 0.6  PROT 5.5* 5.6* 5.5* 5.7* 5.7*  ALBUMIN 2.4* 2.3* 2.4* 2.5* 2.6*    Recent Labs  Lab 06/19/21 2330  LIPASE 30    No results for input(s): AMMONIA in the last 168 hours. Coagulation Profile: Recent Labs  Lab 06/19/21 2145  INR 1.0    Cardiac Enzymes: No results for input(s): CKTOTAL, CKMB, CKMBINDEX, TROPONINI in the last 168 hours. BNP (last 3 results) No results for input(s): PROBNP in the last 8760 hours. HbA1C: No results for input(s): HGBA1C in the last  72 hours. CBG: Recent Labs  Lab 06/24/21 1129 06/24/21 1644 06/24/21 2359 06/25/21 0528 06/25/21 1130  GLUCAP 92 108* 124* 107* 94    Lipid Profile: No results for input(s): CHOL, HDL, LDLCALC, TRIG, CHOLHDL, LDLDIRECT in the last 72 hours.  Thyroid Function Tests: No results for input(s): TSH,  T4TOTAL, FREET4, T3FREE, THYROIDAB in the last 72 hours. Anemia Panel: No results for input(s): VITAMINB12, FOLATE, FERRITIN, TIBC, IRON, RETICCTPCT in the last 72 hours. Sepsis Labs: Recent Labs  Lab 06/19/21 2145 06/19/21 2330 06/20/21 0454  LATICACIDVEN 0.6 0.6 0.5    Recent Results (from the past 240 hour(s))  Resp Panel by RT-PCR (Flu A&B, Covid) Nasopharyngeal Swab     Status: None   Collection Time: 06/19/21  9:20 PM   Specimen: Nasopharyngeal Swab; Nasopharyngeal(NP) swabs in vial transport medium  Result Value Ref Range Status   SARS Coronavirus 2 by RT PCR NEGATIVE NEGATIVE Final    Comment: (NOTE) SARS-CoV-2 target nucleic acids are NOT DETECTED.  The SARS-CoV-2 RNA is generally detectable in upper respiratory specimens during the acute phase of infection. The lowest concentration of SARS-CoV-2 viral copies this assay can detect is 138 copies/mL. A negative result does not preclude SARS-Cov-2 infection and should not be used as the sole basis for treatment or other patient management decisions. A negative result may occur with  improper specimen collection/handling, submission of specimen other than nasopharyngeal swab, presence of viral mutation(s) within the areas targeted by this assay, and inadequate number of viral copies(<138 copies/mL). A negative result must be combined with clinical observations, patient history, and epidemiological information. The expected result is Negative.  Fact Sheet for Patients:  EntrepreneurPulse.com.au  Fact Sheet for Healthcare Providers:  IncredibleEmployment.be  This test is no t yet approved or cleared by the [place] FDA and  has been authorized for detection and/or diagnosis of SARS-CoV-2 by FDA under an Emergency Use Authorization (EUA). This EUA will remain  in effect (meaning this test can be used) for the duration of the COVID-19 declaration under Section 564(b)(1) of the Act,  21 U.S.C.section 360bbb-3(b)(1), unless the authorization is terminated  or revoked sooner.       Influenza A by PCR NEGATIVE NEGATIVE Final   Influenza B by PCR NEGATIVE NEGATIVE Final    Comment: (NOTE) The Xpert Xpress SARS-CoV-2/FLU/RSV plus assay is intended as an aid in the diagnosis of influenza from Nasopharyngeal swab specimens and should not be used as a sole basis for treatment. Nasal washings and aspirates are unacceptable for Xpert Xpress SARS-CoV-2/FLU/RSV testing.  Fact Sheet for Patients: EntrepreneurPulse.com.au  Fact Sheet for Healthcare Providers: IncredibleEmployment.be  This test is not yet approved or cleared by the [place] FDA and has been authorized for detection and/or diagnosis of SARS-CoV-2 by FDA under an Emergency Use Authorization (EUA). This EUA will remain in effect (meaning this test can be used) for the duration of the COVID-19 declaration under Section 564(b)(1) of the Act, 21 U.S.C. section 360bbb-3(b)(1), unless the authorization is terminated or revoked.  Performed at [facility], [place] [address] [place], [place] [address]   Urine Culture     Status: Abnormal   Collection Time: 06/19/21  9:45 PM   Specimen: In/Out Cath Urine  Result Value Ref Range Status   Specimen Description   Final    IN/OUT CATH URINE Performed at [place] [address] [place],  [address]    Special Requests   Final    NONE  Performed at [facility], [place] [address], [place], [place] [address]    Culture (A)  Final    4,000 COLONIES/mL STAPHYLOCOCCUS EPIDERMIDIS 10,000 COLONIES/mL LACTOBACILLUS SPECIES Standardized susceptibility testing for this organism is not available. Performed at [facility], [place] [address], [place], [place] [address]    Report Status 06/21/2021 FINAL  Final  Blood Culture (routine x 2)     Status: Abnormal    Collection Time: 06/19/21  9:45 PM   Specimen: BLOOD  Result Value Ref Range Status   Specimen Description   Final    BLOOD RIGHT ANTECUBITAL Performed at [place] [address] [place], [place] [address]    Special Requests   Final    BOTTLES DRAWN AEROBIC AND ANAEROBIC Blood Culture adequate volume Performed at [place] [address], [place], [place] [address]    Culture  Setup Time   Final    GRAM NEGATIVE RODS AEROBIC BOTTLE ONLY CRITICAL RESULT CALLED TO, READ BACK BY AND VERIFIED WITH: PHARMD M TUCKER [id number] FCP CRITICAL RESULT CALLED TO, READ BACK BY AND VERIFIED WITH: pharmd Dillard [place] 06/22/21@11 :25 by tw    Culture (A)  Final    ACINETOBACTER URSINGII CRITICAL RESULT CALLED TO, READ BACK BY AND VERIFIED WITH: Trude Mcburney [id number] FCP Performed at [facility], [place] [address] [place], [place] [address]    Report Status 06/25/2021 FINAL  Final   Organism ID, Bacteria ACINETOBACTER URSINGII  Final      Susceptibility   Acinetobacter ursingii - MIC*    CEFTAZIDIME 8 SENSITIVE Sensitive     CIPROFLOXACIN 0.5 SENSITIVE Sensitive     GENTAMICIN <=1 SENSITIVE Sensitive     IMIPENEM <=0.25 SENSITIVE Sensitive     TRIMETH/SULFA 80 RESISTANT Resistant     AMPICILLIN/SULBACTAM <=2 SENSITIVE Sensitive     * ACINETOBACTER URSINGII  Blood Culture ID Panel (Reflexed)     Status: None   Collection Time: 06/19/21  9:45 PM  Result Value Ref Range Status   Enterococcus faecalis NOT DETECTED NOT DETECTED Final   Enterococcus Faecium NOT DETECTED NOT DETECTED Final   Listeria monocytogenes NOT DETECTED NOT DETECTED Final   Staphylococcus species NOT DETECTED NOT DETECTED Final   Staphylococcus aureus (BCID) NOT DETECTED NOT DETECTED Final   Staphylococcus epidermidis NOT DETECTED NOT DETECTED Final   Staphylococcus lugdunensis NOT DETECTED NOT DETECTED Final   Streptococcus species NOT DETECTED NOT DETECTED Final    Streptococcus agalactiae NOT DETECTED NOT DETECTED Final   Streptococcus pneumoniae NOT DETECTED NOT DETECTED Final   Streptococcus pyogenes NOT DETECTED NOT DETECTED Final   A.calcoaceticus-baumannii NOT DETECTED NOT DETECTED Final   Bacteroides fragilis NOT DETECTED NOT DETECTED Final   Enterobacterales NOT DETECTED NOT DETECTED Final   Enterobacter cloacae complex NOT DETECTED NOT DETECTED Final   Escherichia coli NOT DETECTED NOT DETECTED Final   Klebsiella aerogenes NOT DETECTED NOT DETECTED Final   Klebsiella oxytoca NOT DETECTED NOT DETECTED Final   Klebsiella pneumoniae NOT DETECTED NOT DETECTED Final   Proteus species NOT DETECTED NOT DETECTED Final   Salmonella species NOT DETECTED NOT DETECTED Final   Serratia marcescens NOT DETECTED NOT DETECTED Final   Haemophilus influenzae NOT DETECTED NOT DETECTED Final   Neisseria meningitidis NOT DETECTED NOT DETECTED Final   Pseudomonas aeruginosa NOT DETECTED NOT DETECTED Final   Stenotrophomonas maltophilia NOT DETECTED NOT DETECTED Final   Candida albicans NOT DETECTED NOT DETECTED Final  Candida auris NOT DETECTED NOT DETECTED Final   Candida glabrata NOT DETECTED NOT DETECTED Final   Candida krusei NOT DETECTED NOT DETECTED Final   Candida parapsilosis NOT DETECTED NOT DETECTED Final   Candida tropicalis NOT DETECTED NOT DETECTED Final   Cryptococcus neoformans/gattii NOT DETECTED NOT DETECTED Final    Comment: Performed at [facility], [place] [address] [place], [place] [address]  MRSA Next Gen by PCR, Nasal     Status: None   Collection Time: 06/20/21  6:27 PM   Specimen: Nasal Mucosa; Nasal Swab  Result Value Ref Range Status   MRSA by PCR Next Gen NOT DETECTED NOT DETECTED Final    Comment: (NOTE) The GeneXpert MRSA Assay (FDA approved for NASAL specimens only), is one component of a comprehensive MRSA colonization surveillance program. It is not intended to diagnose MRSA infection nor to guide or monitor  treatment for MRSA infections. Test performance is not FDA approved in patients less than 27 years old. Performed at [facility], [place] [address], [place], [place] [address]     RN Pressure Injury Documentation:     Estimated body mass index is 22.16 kg/m as calculated from the following:   Height as of this encounter: 5' 8"  (1.727 m).   Weight as of this encounter: 66.1 kg.  Malnutrition Type:  Nutrition Problem: Increased nutrient needs Etiology: acute illness, chronic illness   Malnutrition Characteristics:  Signs/Symptoms: estimated needs   Nutrition Interventions:  Interventions: Prostat, TPN, MVI     Radiology Studies: No results found.  Scheduled Meds:  (feeding supplement) PROSource Plus  30 mL Oral BID BM   acidophilus  1 capsule Oral Daily   amLODipine  10 mg Oral Daily   budesonide  9 mg Oral Daily   buPROPion ER  200 mg Oral Daily   calcium carbonate  500 mg Oral Daily   carvedilol  25 mg Oral BID WC   Chlorhexidine Gluconate Cloth  6 each Topical Daily   cholecalciferol  1,000 Units Oral Daily   ciprofloxacin  750 mg Oral Daily   cycloSPORINE  1 drop Both Eyes BID   DULoxetine  90 mg Oral Daily   enoxaparin (LOVENOX) injection  40 mg Subcutaneous Q24H   estradiol  2 mg Oral Daily   hydrALAZINE  100 mg Oral TID   levETIRAcetam  500 mg Oral BID   lipase/protease/amylase  36,000 Units Oral TID WC   methadone  5 mg Oral 5 X Daily   methocarbamol  500 mg Oral BID   multivitamin with minerals   Oral Daily   pantoprazole  40 mg Oral Daily   polyvinyl alcohol  1 drop Both Eyes QID   sodium chloride flush  10-40 mL Intracatheter Q12H   sucralfate  1 g Oral TID WC & HS   Teduglutide (rDNA)  1.5 mg Subcutaneous Daily   vitamin B-12  1,000 mcg Oral Daily   Continuous Infusions:  sodium chloride     sodium chloride 60 mL/hr at 06/25/21 0854    LOS: 5 days   Kerney Elbe, DO [facility] PAGER is on AMION  If  7PM-7AM, please contact night-coverage [email or web address]

## 2021-06-25 NOTE — Progress Notes (Signed)
Chart check   Patient with bcx/line related infection growing non-baumanii acinetobacter Responding to cefepime  Central line being kept due to difficult access   Acinetobacter species sensitive to cipro Aki on ckd with renal funciton improving  Low grade temp again last 24 hours    -repeat blood cultures to document clearance of bacteremia; if doesn't clear will need to remove central line -continue cefepime for now while in house -transition to ciprofloxacin 750 mg po daily (adjusted for renal function) -if repeat bcx negative can discharge on ciprofloxacin to finish off 2 weeks planned of abx until 8/09

## 2021-06-26 ENCOUNTER — Inpatient Hospital Stay (HOSPITAL_COMMUNITY): Payer: Medicare Other

## 2021-06-26 ENCOUNTER — Ambulatory Visit: Payer: Medicare Other | Admitting: Orthopedic Surgery

## 2021-06-26 DIAGNOSIS — Z8616 Personal history of COVID-19: Secondary | ICD-10-CM | POA: Diagnosis not present

## 2021-06-26 DIAGNOSIS — K5080 Crohn's disease of both small and large intestine without complications: Secondary | ICD-10-CM | POA: Diagnosis not present

## 2021-06-26 DIAGNOSIS — Z515 Encounter for palliative care: Secondary | ICD-10-CM | POA: Diagnosis not present

## 2021-06-26 DIAGNOSIS — K50819 Crohn's disease of both small and large intestine with unspecified complications: Secondary | ICD-10-CM | POA: Diagnosis not present

## 2021-06-26 DIAGNOSIS — K912 Postsurgical malabsorption, not elsewhere classified: Secondary | ICD-10-CM

## 2021-06-26 DIAGNOSIS — F32A Depression, unspecified: Secondary | ICD-10-CM | POA: Diagnosis not present

## 2021-06-26 DIAGNOSIS — N1831 Chronic kidney disease, stage 3a: Secondary | ICD-10-CM | POA: Diagnosis not present

## 2021-06-26 DIAGNOSIS — I129 Hypertensive chronic kidney disease with stage 1 through stage 4 chronic kidney disease, or unspecified chronic kidney disease: Secondary | ICD-10-CM | POA: Diagnosis not present

## 2021-06-26 DIAGNOSIS — F419 Anxiety disorder, unspecified: Secondary | ICD-10-CM | POA: Diagnosis not present

## 2021-06-26 DIAGNOSIS — Z888 Allergy status to other drugs, medicaments and biological substances status: Secondary | ICD-10-CM | POA: Diagnosis not present

## 2021-06-26 DIAGNOSIS — R7989 Other specified abnormal findings of blood chemistry: Secondary | ICD-10-CM | POA: Diagnosis not present

## 2021-06-26 DIAGNOSIS — A4159 Other Gram-negative sepsis: Secondary | ICD-10-CM | POA: Diagnosis not present

## 2021-06-26 DIAGNOSIS — R945 Abnormal results of liver function studies: Secondary | ICD-10-CM | POA: Diagnosis not present

## 2021-06-26 DIAGNOSIS — E538 Deficiency of other specified B group vitamins: Secondary | ICD-10-CM | POA: Diagnosis not present

## 2021-06-26 DIAGNOSIS — Z79899 Other long term (current) drug therapy: Secondary | ICD-10-CM | POA: Diagnosis not present

## 2021-06-26 DIAGNOSIS — R7401 Elevation of levels of liver transaminase levels: Secondary | ICD-10-CM | POA: Diagnosis present

## 2021-06-26 DIAGNOSIS — G894 Chronic pain syndrome: Secondary | ICD-10-CM | POA: Diagnosis not present

## 2021-06-26 DIAGNOSIS — M25512 Pain in left shoulder: Secondary | ICD-10-CM

## 2021-06-26 DIAGNOSIS — Z20822 Contact with and (suspected) exposure to covid-19: Secondary | ICD-10-CM | POA: Diagnosis not present

## 2021-06-26 DIAGNOSIS — D61818 Other pancytopenia: Secondary | ICD-10-CM | POA: Diagnosis not present

## 2021-06-26 DIAGNOSIS — Z7952 Long term (current) use of systemic steroids: Secondary | ICD-10-CM | POA: Diagnosis not present

## 2021-06-26 DIAGNOSIS — N179 Acute kidney failure, unspecified: Secondary | ICD-10-CM | POA: Diagnosis not present

## 2021-06-26 DIAGNOSIS — E876 Hypokalemia: Secondary | ICD-10-CM | POA: Diagnosis not present

## 2021-06-26 DIAGNOSIS — K219 Gastro-esophageal reflux disease without esophagitis: Secondary | ICD-10-CM | POA: Diagnosis not present

## 2021-06-26 DIAGNOSIS — Z789 Other specified health status: Secondary | ICD-10-CM | POA: Diagnosis not present

## 2021-06-26 DIAGNOSIS — Z87891 Personal history of nicotine dependence: Secondary | ICD-10-CM | POA: Diagnosis not present

## 2021-06-26 LAB — CBC WITH DIFFERENTIAL/PLATELET
Abs Immature Granulocytes: 0.01 10*3/uL (ref 0.00–0.07)
Basophils Absolute: 0 10*3/uL (ref 0.0–0.1)
Basophils Relative: 0 %
Eosinophils Absolute: 0.1 10*3/uL (ref 0.0–0.5)
Eosinophils Relative: 2 %
HCT: 25.9 % — ABNORMAL LOW (ref 36.0–46.0)
Hemoglobin: 8.5 g/dL — ABNORMAL LOW (ref 12.0–15.0)
Immature Granulocytes: 0 %
Lymphocytes Relative: 34 %
Lymphs Abs: 1.7 10*3/uL (ref 0.7–4.0)
MCH: 30 pg (ref 26.0–34.0)
MCHC: 32.8 g/dL (ref 30.0–36.0)
MCV: 91.5 fL (ref 80.0–100.0)
Monocytes Absolute: 0.3 10*3/uL (ref 0.1–1.0)
Monocytes Relative: 6 %
Neutro Abs: 2.8 10*3/uL (ref 1.7–7.7)
Neutrophils Relative %: 58 %
Platelets: 205 10*3/uL (ref 150–400)
RBC: 2.83 MIL/uL — ABNORMAL LOW (ref 3.87–5.11)
RDW: 13.8 % (ref 11.5–15.5)
WBC: 4.9 10*3/uL (ref 4.0–10.5)
nRBC: 0 % (ref 0.0–0.2)

## 2021-06-26 LAB — COMPREHENSIVE METABOLIC PANEL
ALT: 54 U/L — ABNORMAL HIGH (ref 0–44)
AST: 15 U/L (ref 15–41)
Albumin: 3 g/dL — ABNORMAL LOW (ref 3.5–5.0)
Alkaline Phosphatase: 158 U/L — ABNORMAL HIGH (ref 38–126)
Anion gap: 5 (ref 5–15)
BUN: 12 mg/dL (ref 6–20)
CO2: 23 mmol/L (ref 22–32)
Calcium: 9.5 mg/dL (ref 8.9–10.3)
Chloride: 107 mmol/L (ref 98–111)
Creatinine, Ser: 1.26 mg/dL — ABNORMAL HIGH (ref 0.44–1.00)
GFR, Estimated: 49 mL/min — ABNORMAL LOW (ref >60)
Glucose, Bld: 107 mg/dL — ABNORMAL HIGH (ref 70–99)
Potassium: 3.6 mmol/L (ref 3.5–5.1)
Sodium: 135 mmol/L (ref 135–145)
Total Bilirubin: 0.7 mg/dL (ref 0.3–1.2)
Total Protein: 6.8 g/dL (ref 6.5–8.1)

## 2021-06-26 LAB — MAGNESIUM: Magnesium: 1.7 mg/dL (ref 1.7–2.4)

## 2021-06-26 LAB — PHOSPHORUS: Phosphorus: 2 mg/dL — ABNORMAL LOW (ref 2.5–4.6)

## 2021-06-26 MED ORDER — DULOXETINE HCL 60 MG PO CPEP
120.0000 mg | ORAL_CAPSULE | Freq: Every day | ORAL | Status: DC
  Administered 2021-06-27 – 2021-06-30 (×4): 120 mg via ORAL
  Filled 2021-06-26 (×4): qty 2

## 2021-06-26 MED ORDER — LIDOCAINE 5 % EX PTCH
1.0000 | MEDICATED_PATCH | CUTANEOUS | Status: DC
  Filled 2021-06-26: qty 1

## 2021-06-26 MED ORDER — CIPROFLOXACIN HCL 500 MG PO TABS
500.0000 mg | ORAL_TABLET | Freq: Two times a day (BID) | ORAL | Status: DC
  Administered 2021-06-26 – 2021-06-30 (×9): 500 mg via ORAL
  Filled 2021-06-26 (×9): qty 1

## 2021-06-26 MED ORDER — ACETAMINOPHEN 500 MG PO TABS: 1000.0000 mg | ORAL_TABLET | Freq: Four times a day (QID) | ORAL | Status: DC | PRN

## 2021-06-26 MED ORDER — CIPROFLOXACIN HCL 500 MG PO TABS: 750.0000 mg | ORAL_TABLET | Freq: Two times a day (BID) | ORAL | Status: DC

## 2021-06-26 MED ORDER — K PHOS MONO-SOD PHOS DI & MONO 155-852-130 MG PO TABS
500.0000 mg | ORAL_TABLET | Freq: Two times a day (BID) | ORAL | Status: AC
  Administered 2021-06-26 (×2): 500 mg via ORAL
  Filled 2021-06-26 (×2): qty 2

## 2021-06-26 MED ORDER — LIDOCAINE 5 % EX PTCH
1.0000 | MEDICATED_PATCH | CUTANEOUS | Status: DC
  Administered 2021-06-26 – 2021-06-29 (×4): 1 via TRANSDERMAL
  Filled 2021-06-26 (×5): qty 1

## 2021-06-26 NOTE — Consult Note (Signed)
Consultation Note Date: 06/26/2021   Patient Name: Caitlin Peters  DOB: 02/12/1961  MRN: [id number]  Age / Sex: 60 y.o., female  PCP: [name], [name], MD Referring Physician: Kerney Elbe, DO  Reason for Consultation: Pain control  HPI/Patient Profile: 60 y.o. 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 Ongoing pain to left shoulder and palliative requested to assist with management.   Clinical Assessment and Goals of Care: I met today at [name]'s bedside and no family present. [name] reflects on her complicated health and infections recently and the toll this has taken on her. She complains about pain in her left shoulder but upon further questioning she describes burning and tingling and "stabbing and throbbing" pain across shoulder and down left arm. Tried to question further about radiating pain how far down and into which fingers (she says all). She complains of 10/10 pain and says that nothing improves pain. She reports that IV dilaudid does not really help either (although RN reports that patient reported some relief after receiving medication). I discussed with her the importance of adjunctive medications and finding pills that provide relief as she cannot remain on IV pain medication and she was not aware that this could not be continued indefinitely. We also discussed the need for further testing to reassess her shoulder and neck. Upon further questioning it seems she has been on dilaudid po for many years (she cannot recall how long) and when I ask why this was started initially she tells me that it was for infections. She  does not seem to have good understanding of her medical condition and treatments and seems to be a poor historian which could be a side effect of increased pain medication as well perhaps.   I question from her descriptions if she is having referred pain from potential cervical stenosis reported on previous MRI, even though this is reported as mild. Of note she has full range of motion and lifts her arms straight above her head while speaking with me and is quite animated. She moves her arm around side to side and above head and does report increased pain but this is not limiting in her function.   Discussed with Dr. Alfredia Ferguson. Will discuss any changes to methadone or dilaudid with Dr. Royce Macadamia (her pain provider). All questions/concerns addressed. Emotional support provided.   Primary Decision Maker PATIENT     SUMMARY OF RECOMMENDATIONS   - Recommendations for pain management below - Previous palliative consultation for [place] 04/14/21 by Dr. Micheline Rough (requested full code/full scope)  Code Status/Advance Care Planning: Full code   Symptom Management:  Bowel Regimen: LBM 7/30. Appears BM ~ every other day.  L shoulder pain: Question if this is referred pain radiating from cervical spine. Describes as more neuropathic in nature.  Plans for MRI shoulder/neck.  Continue methadone. May consider increasing but will not change without coordination with Dr. Royce Macadamia first.  Consider opioid rotation and would consider trial OxyIR 10-15 mg every 4 hours trial in place of dilaudid.  Continue Robaxin. Increase as needed.  Tylenol 1000 mg TID added.  Increasing Cymbalta to 120 mg daily.  Lidoderm patch to L neck/shoulder.  Await MRI results.  Allergy to gabapentin - will avoid.   Palliative Prophylaxis:  Bowel Regimen and Frequent Pain Assessment  Additional Recommendations (Limitations, Scope, Preferences): Full Scope Treatment  Psycho-social/Spiritual:  Desire for further Chaplaincy  support:yes  Prognosis:  Chronic health issues with recurrent infections and needing long term TPN.   Discharge Planning: Home with Home Health      Primary Diagnoses: Present on Admission:  SIRS (systemic inflammatory response syndrome) ([name])  LFTs abnormal  Pancytopenia ([name])  Crohn's disease of small & large intestine with SGS  Short gut syndrome  Chronic pain syndrome  On total parenteral nutrition (TPN)  GERD (gastroesophageal reflux disease)   I have reviewed the medical record, interviewed the patient and family, and examined the patient. The following aspects are pertinent.  Past Medical History:  Diagnosis Date   Acute pancreatitis 04/13/2020   Anasarca 10/2019   AVN (avascular necrosis of bone) ([name])    Cataract    Choledocholithiasis (sludge) s/p ERCP 10/2019 10/21/2020   Chronic pain syndrome    CKD (chronic kidney disease), stage III ([place])    Crohn disease ([place])    Crohn's disease of small & large intestine with SGS 1984   Caitlin Peters is a 60 year old female with a history of Crohn's disease diagnosed in 62 (age 55), history of long-term steroid use with osteoporosis, S/P multiple bowel resections ([id number]) complicated by chronic back and abdominal pain, steatorrhea and short bowel syndrome. The patient has been left with ~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 attached to proximal transverse colon through rectum. She has been   Depression    Diverticulosis    GERD (gastroesophageal reflux disease)    HTN (hypertension)    IDA (iron deficiency anemia)    Malnutrition ([place])    Mass in chest    Osteoporosis    Osteoporosis 12/24/2014   Pancreatitis    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; 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; jejunal to transverse colon anastomosis Treated at [facility] lengthening to 165cm Dr Alene Mires, [place] deficiency    Social  History   Socioeconomic History   Marital status: Single    Spouse name: Not on file   Number of children: Not on file   Years of education: Not on file   Highest education level: Not on file  Occupational History   Occupation: disabled  Tobacco Use   Smoking status: Former   Smokeless tobacco: Never  Scientific laboratory technician Use: Never used  Substance and Sexual Activity   Alcohol use: Not Currently   Drug use: Never   Sexual activity: Yes  Other Topics Concern   Not on file  Social History Narrative   Right handed   One story apartment   No caffeine   Social Determinants of Health   Financial Resource Strain: Not on file  Food Insecurity: Not on file  Transportation Needs: Not on file  Physical Activity: Not on file  Stress: Not on  file  Social Connections: Not on file   Family History  Problem Relation Age of Onset   Seizures Mother    Glaucoma Mother    CAD Father    Heart disease Father    Hypertension Father    Breast cancer Sister    Multiple sclerosis Sister    Diabetes Sister    Lupus Sister    Colon cancer Other    Crohn's disease Other    Scheduled Meds:  (feeding supplement) PROSource Plus  30 mL Oral BID BM   acidophilus  1 capsule Oral Daily   amLODipine  10 mg Oral Daily   budesonide  9 mg Oral Daily   buPROPion ER  200 mg Oral Daily   calcium carbonate  500 mg Oral Daily   carvedilol  25 mg Oral BID WC   Chlorhexidine Gluconate Cloth  6 each Topical Daily   cholecalciferol  1,000 Units Oral Daily   ciprofloxacin  500 mg Oral BID   cycloSPORINE  1 drop Both Eyes BID   DULoxetine  90 mg Oral Daily   enoxaparin (LOVENOX) injection  40 mg Subcutaneous Q24H   estradiol  2 mg Oral Daily   hydrALAZINE  100 mg Oral TID   levETIRAcetam  500 mg Oral BID   lipase/protease/amylase  36,000 Units Oral TID WC   methadone  5 mg Oral 5 X Daily   methocarbamol  500 mg Oral BID   multivitamin with minerals   Oral Daily   pantoprazole  40 mg Oral Daily    phosphorus  500 mg Oral BID   polyvinyl alcohol  1 drop Both Eyes QID   sodium chloride flush  10-40 mL Intracatheter Q12H   sucralfate  1 g Oral TID WC & HS   Teduglutide (rDNA)  1.5 mg Subcutaneous Daily   vitamin B-12  1,000 mcg Oral Daily   Continuous Infusions:  sodium chloride     sodium chloride 60 mL/hr at 06/26/21 1200   PRN Meds:.sodium chloride, acetaminophen **OR** acetaminophen, bisacodyl, famotidine, HYDROmorphone (DILAUDID) injection, HYDROmorphone, Muscle Rub, ondansetron **OR** ondansetron (ZOFRAN) IV, prochlorperazine, senna-docusate, sodium chloride flush Allergies  Allergen Reactions   Meperidine Hives    Other reaction(s): GI Upset Due to [place]    Hyoscyamine Hives and Swelling    Legs swelling Disorientation   Cefepime Other (See Comments)    Neurotoxicity occurring in setting of AKI. Ceftriaxone tolerated during same admit   Gabapentin Other (See Comments)    unknown   Lyrica [Pregabalin] Other (See Comments)    unknown   Topamax [Topiramate] Other (See Comments)    unknown   Zosyn [Piperacillin Sod-Tazobactam So] Other (See Comments)    Patient reports it makes her vomit, her neck stiff, and her "heart feel funny"   Fentanyl Rash    Pt is allergic to fentanyl patch related to the glue (gives her a rash) Pt states she is NOT allergic to fentanyl IV medicine   Morphine And Related Rash   Review of Systems  Constitutional:  Negative for activity change.  Musculoskeletal:        L shoulder pain   Physical Exam Vitals and nursing note reviewed.  Constitutional:      General: She is not in acute distress. Cardiovascular:     Rate and Rhythm: Normal rate.  Pulmonary:     Effort: No tachypnea, accessory muscle usage or respiratory distress.  Musculoskeletal:     Comments: Tender (jumps to touch) of left neck, shoulder and  arm  Neurological:     Mental Status: She is alert and oriented to person, place, and time.    Vital Signs: BP (!) 175/78  (BP Location: Right Arm)   Pulse 69   Temp 98.2 F (36.8 C) (Oral)   Resp 16   Ht 5' 8"  (1.727 m)   Wt 66.1 kg   SpO2 100%   BMI 22.16 kg/m  Pain Scale: 0-10 POSS *See Group Information*: S-Acceptable,Sleep, easy to arouse Pain Score: 5    SpO2: SpO2: 100 % O2 Device:SpO2: 100 % O2 Flow Rate: .   IO: Intake/output summary:  Intake/Output Summary (Last 24 hours) at 06/26/2021 1344 Last data filed at 06/26/2021 1200 Gross per 24 hour  Intake 2020.81 ml  Output 200 ml  Net 1820.81 ml    LBM: Last BM Date: 06/24/21 Baseline Weight: Weight: 59.4 kg Most recent weight: Weight: 66.1 kg     Palliative Assessment/Data:     Time In: 1400 Time Out: 1610 Time Total: 70 min Greater than 50%  of this time was spent counseling and coordinating care related to the above assessment and plan.  Signed by: Vinie Sill, NP Palliative Medicine Team Pager # [phone] (M-F 8a-5p) Team Phone # [phone] (Nights/Weekends)

## 2021-06-26 NOTE — Progress Notes (Addendum)
PHARMACY NOTE:  ANTIMICROBIAL RENAL DOSAGE ADJUSTMENT  Current antimicrobial regimen includes a mismatch between antimicrobial dosage and estimated renal function.  As per policy approved by the Pharmacy & Therapeutics and Medical Executive Committees, the antimicrobial dosage will be adjusted accordingly.  Current antimicrobial dosage:  ciprofloxacin 725m daily  Indication: bacteremia  Renal Function:  Estimated Creatinine Clearance: 47.9 mL/min (A) (by C-G formula based on SCr of 1.26 mg/dL (H)). []      On intermittent HD, scheduled: []      On CRRT    Antimicrobial dosage has been changed to:  5066mBID  Additional comments: Concern for prolonging her qt so will opt for 50054mather than 750m89mhank you for allowing pharmacy to be a part of this patient's care.  TylePhillis HaggisH [facility]/2022 8:12 AM

## 2021-06-26 NOTE — Progress Notes (Signed)
Pt may travel without nurse to MRI per MD.

## 2021-06-26 NOTE — Progress Notes (Signed)
PROGRESS NOTE    Caitlin Peters  YQI:[id number] DOB: 02/24/1961 DOA: 06/19/2021 PCP: [name], [name], MD   Brief Narrative:  The patient is a 60 year old African-American female with a past medical history significant for but not limited to short gut syndrome requiring TPN, history of Crohn's disease with multiple surgeries, chronic pain syndrome, history of chronic PICC line for IV access as well as other comorbidities including chronic pancreatitis who presented to the ED on 06/19/2021 with fever and chills.  She reports that she has not felt well since 05/29/2021 when she was hospitalized late in June 2022.  Since leaving the hospital she has noticed pain in her left shoulder and arm.  She states that fever and chills began the day before admission and has been intermittent.  She reported diaphoresis at night and her fever at home was as high as 102.  She also admits that chronic abdominal pain located upper abdomen on the left side that radiates to the back that is 7 out of 10 and characterized as cramping and sharp as well as dull at times.  She states that her abdominal pain is alleviated by nothing and exacerbated by eating.  She had fevers and chills that were alleviated by nothing and exacerbated by nothing.  Associated symptoms include nausea and vomiting intermittently.  She states that she has chronic diarrhea more like Jell-O consistency but did not notice any blood and did not have any hematuria or dysuria.  She presented the ED and she is found to be febrile at 108 with a pulse rate of 104.  She was leukopenic and did have abnormal LFTs which are now improving.  Urinalysis showed no leukocytes or nitrites and cultures were drawn however.  She was placed on IV antibiotics with IV vancomycin and IV ceftriaxone and now changed to IV Cefepemime by ID given her GNR Bacteremia. Cx's are growing Acinetobacter Aldean Ast is responding to cefepime.  ID recommending continue IV cefepime while  hospitalized and possibly changing to p.o. ciprofloxacin at discharge.  They are recommending continuing her central access in place given her difficult access.  Pain was relatively uncontrolled so we will consult palliative care for further assistance with pain management.  She had low-grade temperatures overnight so ID repeating blood cultures and changed to p.o. ciprofloxacin.  ID recommending continuing for total 2 weeks in the setting of her retained femoral line.  Given her increased shoulder pain despite pain regimen we will repeat an MRI and ID is in agreement.  If she does not continue to improve we will obtain an orthopedic evaluation.  We will also scan the patient's neck given that she may have some radicular pain and ID is recommending checking her ESR and CRP.  Assessment & Plan:   Principal Problem:   SIRS (systemic inflammatory response syndrome) ([name]) Active Problems:   Crohn's disease of small & large intestine with SGS   Short gut syndrome   GERD (gastroesophageal reflux disease)   Chronic pain syndrome   On total parenteral nutrition (TPN)   LFTs abnormal   Pancytopenia ([place])  Sepsis 2/2 to Acinetobacter Ursingii Bacteremia, poA  -Present on admission and in the setting of GNR Bacteremia. Source: No known source of infection, but could have a PICC line infection or an abdominal infection.  -She does have a history of PICC line infections previously -Sepsis order set. Cultures ordered. And Blood Cx x2 showing GNR but Sensitivities as above -Continued IV antibiotics: IV ceftriaxone, IV metronidazole, and  IV vancomycin but now that she has a GNR bacteremia iD was consulted and changed her to IV Cefepime and recommending continuing IV cefepime while hospitalized but have now changed her to p.o. ciprofloxacin 750 mg recommending continuing at discharge until 07/04/2021 -She was given IV fluids to provide volume and remains on Prosource +30 mL p.o. twice daily and is being continued  on Normal saline at 60 mL/h -Monitor for signs of volume depletion; monitor blood pressure carefully.  -Close monitoring of temperature curve and WBC; WBC has gone from 2.9 -> 3.8 -> 4.6 -> 3.5 -> 2.5 -> 3.2 -> 3.0 and is now 3.3 yesterday and today it is 4.9 and improved -Initial lactic acid was normal and repeat was also normal  -Because she had low-grade temperatures with a T-max of 100.2 ID is recommending repeating blood cultures and this has been done -Given her Gram Negative Bacteremia ID feels no absolute indication for line exchange but recommends continuing ciprofloxacin.  Given her continued pain in her left shoulder and left-sided neck we will obtain a cervical MRI as well as a MRI of the left shoulder -Appreciate ID Recc's   Pancytopenia ([place]) -May be related to SIRS. -WBC was 3.5, hemoglobin/hematocrit was 7.4/23.5, and platelet count was 114; -Now WBC is 4.9, Hgb/Hct is 8.5/25.9 and Platelet Count is improved and now at 205 -Continue monitor and trend and repeat CBC in a.m.   Short gut syndrome -With chronic TPN use.   -Holding home TPN given Bacteremia. -On IV fluids; May resume TPN once bacteremia is clearing and Ok with ID   Abnormal LFTs, improving  In the setting of TPN and Bacteremic infection -Patient's AST on admission was 544 is now trended down to 15 and patient's ALT was 365 and has now trended down to 54 -Monitor and trend LFTs and if necessary will obtain a right upper quadrant ultrasound as well as an acute hepatitis panel  GERD (gastroesophageal reflux disease) -Continue home medications with pantoprazole 40 mg p.o. daily as well as famotidine 20 mg p.o. daily as needed heartburn and indigestion -Also continue sucralfate 1 g p.o. 3 times daily  Hypokalemia -Patient's K+ is now 3.6 -Replete with p.o. K-Phos Neutral 500 mg p.o. twice daily for 2 to -Continue to Monitor and Replete as Necessary -Repeat CMP in the AM  Hypophosphatemia -Patient's phosphorus  level was 2.0 -Replete with p.o. K-Phos Neutral 500 mg p.o. twice daily x2 doses -Continue monitor and replete as necessary -Repeat phosphorus level in a.m.   Chronic Pain Syndrome -Continue home medication with methadone 5 mg p.o. 5 times daily as well as p.o. hydromorphone 4 mg every 4 hours as needed severe pain as well as IV hydromorphone 1 mg every 2 hours as needed severe pain -She has used 46 IV Doses of Hydromorphone given her pain so far -Continue with methocarbamol 500 g p.o. twice daily -She is still having significant Pain so will consult Palliative Care for further Symptom Management; palliative care consultation done today and they are going to attempt to reach the patient primary pain provider; we will schedule her acetaminophen 1000 mg every 8, increase her Cymbalta to 120 mg p.o. daily as well as apply lidocaine patch the patient is neck is she is likely having some referred pain.  We will also obtain further imaging of the neck and left shoulder   Crohn's disease of the small and large intestine with short gut syndrome  -Continued home TPN but held given GNR bacteremia from Acetobacter -  Continue with home budesonide 9 mg p.o. daily and with teduglutide 1.5 mg subcu daily for Short Gut -Followed by gastroenterology at [facility]  Left shoulder pain -Had an MRI towards the end of June and did not show any acute findings and is seeing her primary care provider -She has been referred to orthopedic surgery and has appointment August 1. -As above; continues to have some left shoulder pain significantly so we will repeat an MRI given possible seeding and will also check a cervical spine MRI given her referred pain from her neck and down her left arm and shoulder -Pending on evaluation she may need orthopedics to see her while she is in-house  AKI on chronic kidney disease stage IIIa Metabolic acidosis -Stable and improved.  Now BUN/Cr is 18/1.51 -> 15/1.43 -> 14/1.40 and is now further  improved to 12/1.26 and now within her Baseline  -Continue with IV fluid hydration as above -Patient had a small metabolic acidosis with a CO2 21, anion gap of 11, chloride level of 108; Now CO2 is 23, AG is 5, and Chloride Level is 107 -Avoid further nephrotoxic medications, contrast dyes, hypotension and renally dose medications Repeat CMP in a.m.  Depression and Anxiety -Continue with Bupropion ER 20 g p.o. daily; Duloxetine 90 mg p.o. daily increased by palliative to 120 mg p.o. daily  Hypertension -Continue to monitor blood pressures per protocol  -Continue with Amlodipine 10 mg p.o. daily as well as Carvedilol 25 mg p.o. twice daily -Continue to Monitor BP per Protocol -Last BP was 160/73  History of Recurrent Pancreatitis -Continue with Creon 36,000 units p.o. 3 times daily with meals  DVT prophylaxis: Enoxaparin 40 mg sq q24h Code Status: FULL CODE  Family Communication: No family present at bedside Disposition Plan: Pending further clinical improvement and clearance by ID and Pain has to be controlled   Status is: Inpatient  Remains inpatient appropriate because:Unsafe d/c plan, IV treatments appropriate due to intensity of illness or inability to take PO, and Inpatient level of care appropriate due to severity of illness  Dispo:  Patient From: Home  Planned Disposition: Home with Health Care Svc  Medically stable for discharge: No    Consultants:  Infectious Diseases  Palliative Care Medicine   Procedures: None  Antimicrobials:  Anti-infectives (From admission, onward)    Start     Dose/Rate Route Frequency Ordered Stop   06/26/21 2000  ciprofloxacin (CIPRO) tablet 500 mg        500 mg Oral 2 times daily 06/26/21 1021     06/26/21 0900  ciprofloxacin (CIPRO) tablet 750 mg  Status:  Discontinued        750 mg Oral 2 times daily 06/26/21 0812 06/26/21 1021   06/25/21 1400  ciprofloxacin (CIPRO) tablet 750 mg  Status:  Discontinued        750 mg Oral Daily  06/25/21 1218 06/26/21 0812   06/23/21 1600  ceFEPIme (MAXIPIME) 2 g in sodium chloride 0.9 % 100 mL IVPB  Status:  Discontinued        2 g 200 mL/hr over 30 Minutes Intravenous Every 12 hours 06/22/21 1452 06/23/21 0843   06/23/21 1000  ceFEPIme (MAXIPIME) 2 g in sodium chloride 0.9 % 100 mL IVPB  Status:  Discontinued        2 g 200 mL/hr over 30 Minutes Intravenous Every 12 hours 06/23/21 0843 06/25/21 1218   06/22/21 1300  ceFEPIme (MAXIPIME) 2 g in sodium chloride 0.9 % 100 mL IVPB  Status:  Discontinued        2 g 200 mL/hr over 30 Minutes Intravenous Every 12 hours 06/22/21 1213 06/22/21 1452   06/20/21 2300  cefTRIAXone (ROCEPHIN) 2 g in sodium chloride 0.9 % 100 mL IVPB  Status:  Discontinued        2 g 200 mL/hr over 30 Minutes Intravenous Every 24 hours 06/20/21 0354 06/22/21 1213   06/20/21 2200  vancomycin (VANCOREADY) IVPB 750 mg/150 mL  Status:  Discontinued        750 mg 150 mL/hr over 60 Minutes Intravenous Every 24 hours 06/20/21 0152 06/22/21 1213   06/20/21 0400  metroNIDAZOLE (FLAGYL) IVPB 500 mg  Status:  Discontinued        500 mg 100 mL/hr over 60 Minutes Intravenous Every 8 hours 06/20/21 0354 06/22/21 1213   06/19/21 2345  cefTRIAXone (ROCEPHIN) 1 g in sodium chloride 0.9 % 100 mL IVPB        1 g 200 mL/hr over 30 Minutes Intravenous  Once 06/19/21 2341 06/20/21 0151   06/19/21 2300  cefTRIAXone (ROCEPHIN) 1 g in sodium chloride 0.9 % 100 mL IVPB        1 g 200 mL/hr over 30 Minutes Intravenous  Once 06/19/21 2253 06/20/21 0011   06/19/21 2300  vancomycin (VANCOCIN) IVPB 1000 mg/200 mL premix        1,000 mg 200 mL/hr over 60 Minutes Intravenous  Once 06/19/21 2253 06/20/21 0113        Subjective: Seen and examined at bedside and she is seen ambulating the halls this morning without any issue but then when she got back to bed she started having significant left shoulder pain to the point of tears.  She denies any chest pain or shortness of breath but was  complaining of left-sided shoulder and arm pain is very significant.  ID recommending continuing p.o. ciprofloxacin.  We will repeat imaging of her shoulder given her significant pain and also obtain a cervical spine MRI given painful palpation on her neck as well.  Objective: Vitals:   06/25/21 2058 06/26/21 0521 06/26/21 1222 06/26/21 1944  BP: (!) 172/72 (!) 180/78 (!) 175/78 (!) 164/84  Pulse: 67 71 69 67  Resp: _0 Temp: 98.6 F (37 C) 98.9 F (37.2 C) 98.2 F (36.8 C) 98.7 F (37.1 C)  TempSrc: Oral Oral Oral Oral  SpO2: 98% 98% 100% 99%  Weight:      Height:        Intake/Output Summary (Last 24 hours) at 06/26/2021 1949 Last data filed at 06/26/2021 1633 Gross per 24 hour  Intake 1790.81 ml  Output 200 ml  Net 1590.81 ml    Filed Weights   06/22/21 0540 06/24/21 0458 06/25/21 0435  Weight: 67.5 kg 70.9 kg 66.1 kg   Examination: Physical Exam:  Constitutional: WN/WD chronically ill-appearing African-American female and moderate distress appears uncomfortable and in tears secondary to pain  Eyes: Lids and conjunctivae normal, sclerae anicteric  ENMT: External Ears, Nose appear normal. Grossly normal hearing.  Neck: Appears normal, supple, no cervical masses, normal ROM, no appreciable thyromegaly; no JVD Respiratory: Diminished to auscultation bilaterally, no wheezing, rales, rhonchi or crackles. Normal respiratory effort and patient is not tachypenic. No accessory muscle use.  Unlabored breathing Cardiovascular: RRR, no murmurs / rubs / gallops. S1 and S2 auscultated. No extremity edema. 2+ pedal pulses. No carotid bruits.  Abdomen: Soft, non-tender, distended slightly. Bowel sounds positive.  GU: Deferred. Musculoskeletal: No clubbing /  cyanosis of digits/nails. No joint deformity upper and lower extremities.  Skin: No rashes, lesions, ulcers on limited skin evaluation. No induration; Warm and dry.  Neurologic: CN 2-12 grossly intact with no focal deficits.  Romberg sign and cerebellar reflexes not assessed.  Psychiatric: Normal judgment and insight. Alert and oriented x 3.  Anxious and tearful mood.  Data Reviewed: I have personally reviewed following labs and imaging studies  CBC: Recent Labs  Lab 06/22/21 0320 06/23/21 0413 06/24/21 0402 06/25/21 0325 06/26/21 0410  WBC 2.5* 3.2* 3.0* 3.3* 4.9  NEUTROABS 1.2* 1.4* 1.3* 1.5* 2.8  HGB 7.3* 7.6* 7.8* 7.6* 8.5*  HCT 23.5* 23.8* 24.0* 23.6* 25.9*  MCV 97.9 96.0 95.6 93.7 91.5  PLT 129* 121* 154 168 768    Basic Metabolic Panel: Recent Labs  Lab 06/22/21 0320 06/23/21 0413 06/24/21 0402 06/25/21 0325 06/26/21 0410  NA 139 138 139 140 135  K 3.6 3.1* 3.4* 3.2* 3.6  CL 112* 111 112* 112* 107  CO2 _0 GLUCOSE 117* 101* 97 81 107*  BUN 21* _1 CREATININE 1.58* 1.51* 1.43* 1.40* 1.26*  CALCIUM 8.6* 8.4* 8.6* 9.1 9.5  MG 1.9 1.8 1.7 2.1 1.7  PHOS 3.6 3.5 2.9 2.6 2.0*    GFR: Estimated Creatinine Clearance: 47.9 mL/min (A) (by C-G formula based on SCr of 1.26 mg/dL (H)). Liver Function Tests: Recent Labs  Lab 06/22/21 0320 06/23/21 0413 06/24/21 0402 06/25/21 0325 06/26/21 0410  AST 40 22 17 14* 15  ALT 137* 93* 73* 57* 54*  ALKPHOS 167* 154* 153* 132* 158*  BILITOT 0.5 0.5 0.5 0.6 0.7  PROT 5.6* 5.5* 5.7* 5.7* 6.8  ALBUMIN 2.3* 2.4* 2.5* 2.6* 3.0*    Recent Labs  Lab 06/19/21 2330  LIPASE 30    No results for input(s): AMMONIA in the last 168 hours. Coagulation Profile: Recent Labs  Lab 06/19/21 2145  INR 1.0    Cardiac Enzymes: No results for input(s): CKTOTAL, CKMB, CKMBINDEX, TROPONINI in the last 168 hours. BNP (last 3 results) No results for input(s): PROBNP in the last 8760 hours. HbA1C: No results for input(s): HGBA1C in the last 72 hours. CBG: Recent Labs  Lab 06/24/21 1129 06/24/21 1644 06/24/21 2359 06/25/21 0528 06/25/21 1130  GLUCAP 92 108* 124* 107* 94    Lipid Profile: No results for input(s): CHOL, HDL,  LDLCALC, TRIG, CHOLHDL, LDLDIRECT in the last 72 hours.  Thyroid Function Tests: No results for input(s): TSH, T4TOTAL, FREET4, T3FREE, THYROIDAB in the last 72 hours. Anemia Panel: No results for input(s): VITAMINB12, FOLATE, FERRITIN, TIBC, IRON, RETICCTPCT in the last 72 hours. Sepsis Labs: Recent Labs  Lab 06/19/21 2145 06/19/21 2330 06/20/21 0454  LATICACIDVEN 0.6 0.6 0.5    Recent Results (from the past 240 hour(s))  Resp Panel by RT-PCR (Flu A&B, Covid) Nasopharyngeal Swab     Status: None   Collection Time: 06/19/21  9:20 PM   Specimen: Nasopharyngeal Swab; Nasopharyngeal(NP) swabs in vial transport medium  Result Value Ref Range Status   SARS Coronavirus 2 by RT PCR NEGATIVE NEGATIVE Final    Comment: (NOTE) SARS-CoV-2 target nucleic acids are NOT DETECTED.  The SARS-CoV-2 RNA is generally detectable in upper respiratory specimens during the acute phase of infection. The lowest concentration of SARS-CoV-2 viral copies this assay can detect is 138 copies/mL. A negative result does not preclude SARS-Cov-2 infection and should not be used as the sole basis for treatment or other patient management  decisions. A negative result may occur with  improper specimen collection/handling, submission of specimen other than nasopharyngeal swab, presence of viral mutation(s) within the areas targeted by this assay, and inadequate number of viral copies(<138 copies/mL). A negative result must be combined with clinical observations, patient history, and epidemiological information. The expected result is Negative.  Fact Sheet for Patients:  EntrepreneurPulse.com.au  Fact Sheet for Healthcare Providers:  IncredibleEmployment.be  This test is no t yet approved or cleared by the [place] FDA and  has been authorized for detection and/or diagnosis of SARS-CoV-2 by FDA under an Emergency Use Authorization (EUA). This EUA will remain  in effect  (meaning this test can be used) for the duration of the COVID-19 declaration under Section 564(b)(1) of the Act, 21 U.S.C.section 360bbb-3(b)(1), unless the authorization is terminated  or revoked sooner.       Influenza A by PCR NEGATIVE NEGATIVE Final   Influenza B by PCR NEGATIVE NEGATIVE Final    Comment: (NOTE) The Xpert Xpress SARS-CoV-2/FLU/RSV plus assay is intended as an aid in the diagnosis of influenza from Nasopharyngeal swab specimens and should not be used as a sole basis for treatment. Nasal washings and aspirates are unacceptable for Xpert Xpress SARS-CoV-2/FLU/RSV testing.  Fact Sheet for Patients: EntrepreneurPulse.com.au  Fact Sheet for Healthcare Providers: IncredibleEmployment.be  This test is not yet approved or cleared by the [place] FDA and has been authorized for detection and/or diagnosis of SARS-CoV-2 by FDA under an Emergency Use Authorization (EUA). This EUA will remain in effect (meaning this test can be used) for the duration of the COVID-19 declaration under Section 564(b)(1) of the Act, 21 U.S.C. section 360bbb-3(b)(1), unless the authorization is terminated or revoked.  Performed at [facility], [place] [address], [place], [place] [address]   Urine Culture     Status: Abnormal   Collection Time: 06/19/21  9:45 PM   Specimen: In/Out Cath Urine  Result Value Ref Range Status   Specimen Description   Final    IN/OUT CATH URINE Performed at [facility] [address] [place], [place] [address]    Special Requests   Final    NONE Performed at [facility], [place] [address] [place], [place] [address]    Culture (A)  Final    4,000 COLONIES/mL STAPHYLOCOCCUS EPIDERMIDIS 10,000 COLONIES/mL LACTOBACILLUS SPECIES Standardized susceptibility testing for this organism is not available. Performed at [facility], [place] [address]  [place], [place] [address]    Report Status 06/21/2021 FINAL  Final  Blood Culture (routine x 2)     Status: Abnormal   Collection Time: 06/19/21  9:45 PM   Specimen: BLOOD  Result Value Ref Range Status   Specimen Description   Final    BLOOD RIGHT ANTECUBITAL Performed at [facility] [address], [place], [place] [address]    Special Requests   Final    BOTTLES DRAWN AEROBIC AND ANAEROBIC Blood Culture adequate volume Performed at [facility] [address], [place], [place] [address]    Culture  Setup Time   Final    GRAM NEGATIVE RODS AEROBIC BOTTLE ONLY CRITICAL RESULT CALLED TO, READ BACK BY AND VERIFIED WITH: PHARMD M TUCKER [id number] FCP CRITICAL RESULT CALLED TO, READ BACK BY AND VERIFIED WITH: pharmd Dillard [place] 06/22/21_0 :25 by tw    Culture (A)  Final    ACINETOBACTER URSINGII CRITICAL RESULT CALLED TO, READ BACK BY AND VERIFIED WITH:  [place] [id number] FCP Performed at [facility] [address] [place], [place] [address]    Report Status 06/25/2021 FINAL  Final   Organism ID, Bacteria ACINETOBACTER URSINGII  Final      Susceptibility   Acinetobacter ursingii - MIC*    CEFTAZIDIME 8 SENSITIVE Sensitive     CIPROFLOXACIN 0.5 SENSITIVE Sensitive     GENTAMICIN <=1 SENSITIVE Sensitive     IMIPENEM <=0.25 SENSITIVE Sensitive     TRIMETH/SULFA 80 RESISTANT Resistant     AMPICILLIN/SULBACTAM <=2 SENSITIVE Sensitive     * ACINETOBACTER URSINGII  Blood Culture ID Panel (Reflexed)     Status: None   Collection Time: 06/19/21  9:45 PM  Result Value Ref Range Status   Enterococcus faecalis NOT DETECTED NOT DETECTED Final   Enterococcus Faecium NOT DETECTED NOT DETECTED Final   Listeria monocytogenes NOT DETECTED NOT DETECTED Final   Staphylococcus species NOT DETECTED NOT DETECTED Final   Staphylococcus aureus (BCID) NOT DETECTED NOT DETECTED Final   Staphylococcus epidermidis NOT DETECTED NOT DETECTED Final    Staphylococcus lugdunensis NOT DETECTED NOT DETECTED Final   Streptococcus species NOT DETECTED NOT DETECTED Final   Streptococcus agalactiae NOT DETECTED NOT DETECTED Final   Streptococcus pneumoniae NOT DETECTED NOT DETECTED Final   Streptococcus pyogenes NOT DETECTED NOT DETECTED Final   A.calcoaceticus-baumannii NOT DETECTED NOT DETECTED Final   Bacteroides fragilis NOT DETECTED NOT DETECTED Final   Enterobacterales NOT DETECTED NOT DETECTED Final   Enterobacter cloacae complex NOT DETECTED NOT DETECTED Final   Escherichia coli NOT DETECTED NOT DETECTED Final   Klebsiella aerogenes NOT DETECTED NOT DETECTED Final   Klebsiella oxytoca NOT DETECTED NOT DETECTED Final   Klebsiella pneumoniae NOT DETECTED NOT DETECTED Final   Proteus species NOT DETECTED NOT DETECTED Final   Salmonella species NOT DETECTED NOT DETECTED Final   Serratia marcescens NOT DETECTED NOT DETECTED Final   Haemophilus influenzae NOT DETECTED NOT DETECTED Final   Neisseria meningitidis NOT DETECTED NOT DETECTED Final   Pseudomonas aeruginosa NOT DETECTED NOT DETECTED Final   Stenotrophomonas maltophilia NOT DETECTED NOT DETECTED Final   Candida albicans NOT DETECTED NOT DETECTED Final   Candida auris NOT DETECTED NOT DETECTED Final   Candida glabrata NOT DETECTED NOT DETECTED Final   Candida krusei NOT DETECTED NOT DETECTED Final   Candida parapsilosis NOT DETECTED NOT DETECTED Final   Candida tropicalis NOT DETECTED NOT DETECTED Final   Cryptococcus neoformans/gattii NOT DETECTED NOT DETECTED Final    Comment: Performed at [facility], [place] [address] [place], [place] [address]  MRSA Next Gen by PCR, Nasal     Status: None   Collection Time: 06/20/21  6:27 PM   Specimen: Nasal Mucosa; Nasal Swab  Result Value Ref Range Status   MRSA by PCR Next Gen NOT DETECTED NOT DETECTED Final    Comment: (NOTE) The GeneXpert MRSA Assay (FDA approved for NASAL specimens only), is one component of a comprehensive  MRSA colonization surveillance program. It is not intended to diagnose MRSA infection nor to guide or monitor treatment for MRSA infections. Test performance is not FDA approved in patients less than 74 years old. Performed at [facility], [place] [address], [place], [place] [address]   Culture, blood (routine x 2)     Status: None (Preliminary result)   Collection Time: 06/25/21 12:49 PM   Specimen: BLOOD  Result Value Ref Range Status   Specimen Description   Final  BLOOD RIGHT ANTECUBITAL Performed at [facility] [address] [place], [place] [address]    Special Requests   Final    BOTTLES DRAWN AEROBIC ONLY Blood Culture results may not be optimal due to an inadequate volume of blood received in culture bottles Performed at [facility] [address], [place], [place] [address]    Culture   Final    NO GROWTH < 24 HOURS Performed at [facility] [address], [place], [place] [address]    Report Status PENDING  Incomplete  Culture, blood (routine x 2)     Status: None (Preliminary result)   Collection Time: 06/25/21  1:38 PM   Specimen: BLOOD  Result Value Ref Range Status   Specimen Description   Final    BLOOD BLOOD RIGHT HAND Performed at [facility] [address] [place], [place] [address]    Special Requests   Final    BOTTLES DRAWN AEROBIC ONLY Blood Culture results may not be optimal due to an inadequate volume of blood received in culture bottles Performed at [facility] [address], [place], [place] [address]    Culture   Final    NO GROWTH < 24 HOURS Performed at [facility] [address] [place], [place] [address]    Report Status PENDING  Incomplete    RN Pressure Injury Documentation:     Estimated body mass index is 22.16 kg/m as calculated from the following:   Height as of this encounter: _0  (1.727 m).   Weight as of  this encounter: 66.1 kg.  Malnutrition Type:  Nutrition Problem: Increased nutrient needs Etiology: acute illness, chronic illness   Malnutrition Characteristics:  Signs/Symptoms: estimated needs   Nutrition Interventions:  Interventions: Prostat, TPN, MVI     Radiology Studies: No results found.  Scheduled Meds:  (feeding supplement) PROSource Plus  30 mL Oral BID BM   acidophilus  1 capsule Oral Daily   amLODipine  10 mg Oral Daily   budesonide  9 mg Oral Daily   buPROPion ER  200 mg Oral Daily   calcium carbonate  500 mg Oral Daily   carvedilol  25 mg Oral BID WC   Chlorhexidine Gluconate Cloth  6 each Topical Daily   cholecalciferol  1,000 Units Oral Daily   ciprofloxacin  500 mg Oral BID   cycloSPORINE  1 drop Both Eyes BID   [START ON 06/27/2021] DULoxetine  120 mg Oral Daily   enoxaparin (LOVENOX) injection  40 mg Subcutaneous Q24H   estradiol  2 mg Oral Daily   hydrALAZINE  100 mg Oral TID   levETIRAcetam  500 mg Oral BID   lidocaine  1 patch Transdermal Q24H   lipase/protease/amylase  36,000 Units Oral TID WC   methadone  5 mg Oral 5 X Daily   methocarbamol  500 mg Oral BID   multivitamin with minerals   Oral Daily   pantoprazole  40 mg Oral Daily   phosphorus  500 mg Oral BID   polyvinyl alcohol  1 drop Both Eyes QID   sodium chloride flush  10-40 mL Intracatheter Q12H   sucralfate  1 g Oral TID WC & HS   Teduglutide (rDNA)  1.5 mg Subcutaneous Daily   vitamin B-12  1,000 mcg Oral Daily   Continuous Infusions:  sodium chloride     sodium chloride 60 mL/hr at 06/26/21 1942    LOS: 6  days   Kerney Elbe, DO [facility] PAGER is on AMION  If 7PM-7AM, please contact night-coverage [email or web address]

## 2021-06-26 NOTE — Care Management Important Message (Signed)
Important Message  Patient Details IM Letter given to the Patient. Name: Caitlin Peters MRN: [id number] Date of Birth: 17-Mar-1961   Medicare Important Message Given:  Yes     [name] 06/26/2021, 12:01 PM

## 2021-06-26 NOTE — Progress Notes (Signed)
Caitlin Peters for Infectious Disease  Date of Admission:  06/19/2021           Reason for visit: Follow up on bacteremia  Current antibiotics: Ciprofloxacin    ASSESSMENT:    Acinetobacter BSI secondary to line infection.  Femoral line last exchanged May 2022 SGS secondary to Crohns disease Left shoulder pain with previously normal MRI late June AKI on CKD 3 Hx of discitis (cx negative) s/p 6 week antibiotic course (dapto/cefepime completed ~03/24/21)   PLAN:    Continue ciprofloxacin.  Pharmacy to adjust dose based on her renal function.  Planning for 2 weeks in the setting of retained femoral line.  High risk of having repeat line related infections.  Due to increased shoulder pain despite attempts at pain control agree with repeat imaging.  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   Short gut syndrome   GERD (gastroesophageal reflux disease)   Chronic pain syndrome   On total parenteral nutrition (TPN)   LFTs abnormal   Pancytopenia ([name])    MEDICATIONS:    Scheduled Meds:  (feeding supplement) PROSource Plus  30 mL Oral BID BM   acidophilus  1 capsule Oral Daily   amLODipine  10 mg Oral Daily   budesonide  9 mg Oral Daily   buPROPion ER  200 mg Oral Daily   calcium carbonate  500 mg Oral Daily   carvedilol  25 mg Oral BID WC   Chlorhexidine Gluconate Cloth  6 each Topical Daily   cholecalciferol  1,000 Units Oral Daily   ciprofloxacin  500 mg Oral BID   cycloSPORINE  1 drop Both Eyes BID   DULoxetine  90 mg Oral Daily   enoxaparin (LOVENOX) injection  40 mg Subcutaneous Q24H   estradiol  2 mg Oral Daily   hydrALAZINE  100 mg Oral TID   levETIRAcetam  500 mg Oral BID   lipase/protease/amylase  36,000 Units Oral TID WC   methadone  5 mg Oral 5 X Daily   methocarbamol  500 mg Oral BID    multivitamin with minerals   Oral Daily   pantoprazole  40 mg Oral Daily   phosphorus  500 mg Oral BID   polyvinyl alcohol  1 drop Both Eyes QID   sodium chloride flush  10-40 mL Intracatheter Q12H   sucralfate  1 g Oral TID WC & HS   Teduglutide (rDNA)  1.5 mg Subcutaneous Daily   vitamin B-12  1,000 mcg Oral Daily   Continuous Infusions:  sodium chloride     sodium chloride 60 mL/hr at 06/26/21 0108   PRN Meds:.sodium chloride, acetaminophen **OR** acetaminophen, bisacodyl, famotidine, HYDROmorphone (DILAUDID) injection, HYDROmorphone, Muscle Rub, ondansetron **OR** ondansetron (ZOFRAN) IV, prochlorperazine, senna-docusate, sodium chloride flush  SUBJECTIVE:   24 hour events:  No fever recorded WBC 4.9  Complains of severe shoulder pain in the left shoulder.  Worried about getting an MRI.  Review of Systems  All other systems reviewed and are negative.    OBJECTIVE:   Blood pressure (!) 175/78, pulse 69, temperature 98.2 F (36.8 C), temperature source Oral, resp. rate 16, height 5' 8"  (1.727 m), weight 66.1 kg, SpO2 100 %. Body mass index is 22.16 kg/m.  Physical Exam Constitutional:      General: She is not in acute distress.  Appearance: Normal appearance.  HENT:     Head: Normocephalic and atraumatic.  Pulmonary:     Effort: Pulmonary effort is normal. No respiratory distress.  Musculoskeletal:        General: No swelling or deformity. Normal range of motion.     Comments: She is able to actively move her left shoulder with preserved ROM.  She is tender throughout her shoulder but points anteriorly to area of most pain.  She has no warmth or erythema.   Skin:    General: Skin is warm and dry.     Findings: No rash.  Neurological:     General: No focal deficit present.     Mental Status: She is alert and oriented to person, place, and time.  Psychiatric:        Mood and Affect: Mood normal.        Behavior: Behavior normal.     Lab Results: Lab Results   Component Value Date   WBC 4.9 06/26/2021   HGB 8.5 (L) 06/26/2021   HCT 25.9 (L) 06/26/2021   MCV 91.5 06/26/2021   PLT 205 06/26/2021    Lab Results  Component Value Date   NA 135 06/26/2021   K 3.6 06/26/2021   CO2 23 06/26/2021   GLUCOSE 107 (H) 06/26/2021   BUN 12 06/26/2021   CREATININE 1.26 (H) 06/26/2021   CALCIUM 9.5 06/26/2021   GFRNONAA 49 (L) 06/26/2021   GFRAA 55 (L) 08/12/2020    Lab Results  Component Value Date   ALT 54 (H) 06/26/2021   AST 15 06/26/2021   ALKPHOS 158 (H) 06/26/2021   BILITOT 0.7 06/26/2021       Component Value Date/Time   CRP 0.7 06/02/2021 1450       Component Value Date/Time   ESRSEDRATE 65 (H) 06/02/2021 1450     I have reviewed the micro and lab results in Epic.  Imaging: No results found.   Imaging independently reviewed in Epic.    [name] for Infectious Disease [facility] [phone] pager 06/26/2021, 12:27 PM  I spent greater than 35 minutes with the patient including greater than 50% of time in face to face counsel of the patient and in coordination of their care.

## 2021-06-26 NOTE — Plan of Care (Signed)
  Problem: Activity: Goal: Risk for activity intolerance will decrease Outcome: Progressing   Problem: Elimination: Goal: Will not experience complications related to bowel motility Outcome: Progressing Goal: Will not experience complications related to urinary retention Outcome: Progressing   Problem: Safety: Goal: Ability to remain free from injury will improve Outcome: Progressing

## 2021-06-26 NOTE — Progress Notes (Signed)
Patient BP 180/78. Patient asymptomatic. Patient with uncontrolled pain and tearful this morning. On call provider made aware about BP.

## 2021-06-27 DIAGNOSIS — R945 Abnormal results of liver function studies: Secondary | ICD-10-CM

## 2021-06-27 DIAGNOSIS — R7881 Bacteremia: Secondary | ICD-10-CM

## 2021-06-27 DIAGNOSIS — M25512 Pain in left shoulder: Secondary | ICD-10-CM | POA: Diagnosis not present

## 2021-06-27 DIAGNOSIS — G894 Chronic pain syndrome: Secondary | ICD-10-CM | POA: Diagnosis not present

## 2021-06-27 DIAGNOSIS — K912 Postsurgical malabsorption, not elsewhere classified: Secondary | ICD-10-CM | POA: Diagnosis not present

## 2021-06-27 DIAGNOSIS — Z789 Other specified health status: Secondary | ICD-10-CM | POA: Diagnosis not present

## 2021-06-27 DIAGNOSIS — K219 Gastro-esophageal reflux disease without esophagitis: Secondary | ICD-10-CM | POA: Diagnosis not present

## 2021-06-27 DIAGNOSIS — R7401 Elevation of levels of liver transaminase levels: Secondary | ICD-10-CM | POA: Diagnosis not present

## 2021-06-27 DIAGNOSIS — A4159 Other Gram-negative sepsis: Secondary | ICD-10-CM | POA: Diagnosis not present

## 2021-06-27 DIAGNOSIS — Z515 Encounter for palliative care: Secondary | ICD-10-CM | POA: Diagnosis not present

## 2021-06-27 DIAGNOSIS — K50819 Crohn's disease of both small and large intestine with unspecified complications: Secondary | ICD-10-CM | POA: Diagnosis not present

## 2021-06-27 LAB — COMPREHENSIVE METABOLIC PANEL
ALT: 41 U/L (ref 0–44)
AST: 14 U/L — ABNORMAL LOW (ref 15–41)
Albumin: 2.8 g/dL — ABNORMAL LOW (ref 3.5–5.0)
Alkaline Phosphatase: 143 U/L — ABNORMAL HIGH (ref 38–126)
Anion gap: 9 (ref 5–15)
BUN: 11 mg/dL (ref 6–20)
CO2: 26 mmol/L (ref 22–32)
Calcium: 9.1 mg/dL (ref 8.9–10.3)
Chloride: 104 mmol/L (ref 98–111)
Creatinine, Ser: 1.21 mg/dL — ABNORMAL HIGH (ref 0.44–1.00)
GFR, Estimated: 51 mL/min — ABNORMAL LOW (ref >60)
Glucose, Bld: 90 mg/dL (ref 70–99)
Potassium: 2.9 mmol/L — ABNORMAL LOW (ref 3.5–5.1)
Sodium: 139 mmol/L (ref 135–145)
Total Bilirubin: 0.9 mg/dL (ref 0.3–1.2)
Total Protein: 6.2 g/dL — ABNORMAL LOW (ref 6.5–8.1)

## 2021-06-27 LAB — CBC WITH DIFFERENTIAL/PLATELET
Abs Immature Granulocytes: 0.01 10*3/uL (ref 0.00–0.07)
Basophils Absolute: 0 10*3/uL (ref 0.0–0.1)
Basophils Relative: 0 %
Eosinophils Absolute: 0.1 10*3/uL (ref 0.0–0.5)
Eosinophils Relative: 2 %
HCT: 24.3 % — ABNORMAL LOW (ref 36.0–46.0)
Hemoglobin: 8 g/dL — ABNORMAL LOW (ref 12.0–15.0)
Immature Granulocytes: 0 %
Lymphocytes Relative: 42 %
Lymphs Abs: 1.8 10*3/uL (ref 0.7–4.0)
MCH: 29.7 pg (ref 26.0–34.0)
MCHC: 32.9 g/dL (ref 30.0–36.0)
MCV: 90.3 fL (ref 80.0–100.0)
Monocytes Absolute: 0.3 10*3/uL (ref 0.1–1.0)
Monocytes Relative: 8 %
Neutro Abs: 2.1 10*3/uL (ref 1.7–7.7)
Neutrophils Relative %: 48 %
Platelets: 185 10*3/uL (ref 150–400)
RBC: 2.69 MIL/uL — ABNORMAL LOW (ref 3.87–5.11)
RDW: 14 % (ref 11.5–15.5)
WBC: 4.3 10*3/uL (ref 4.0–10.5)
nRBC: 0 % (ref 0.0–0.2)

## 2021-06-27 LAB — SEDIMENTATION RATE: Sed Rate: 40 mm/hr — ABNORMAL HIGH (ref 0–22)

## 2021-06-27 LAB — PHOSPHORUS: Phosphorus: 3.5 mg/dL (ref 2.5–4.6)

## 2021-06-27 LAB — C-REACTIVE PROTEIN: CRP: 0.6 mg/dL (ref <1.0)

## 2021-06-27 LAB — MAGNESIUM: Magnesium: 1.6 mg/dL — ABNORMAL LOW (ref 1.7–2.4)

## 2021-06-27 MED ORDER — BOOST / RESOURCE BREEZE PO LIQD CUSTOM
1.0000 | ORAL | Status: DC
  Administered 2021-06-27 – 2021-06-30 (×3): 1 via ORAL

## 2021-06-27 MED ORDER — HYDRALAZINE HCL 20 MG/ML IJ SOLN
10.0000 mg | Freq: Once | INTRAMUSCULAR | Status: AC
  Administered 2021-06-27: 10 mg via INTRAVENOUS
  Filled 2021-06-27: qty 1

## 2021-06-27 MED ORDER — POTASSIUM CHLORIDE CRYS ER 20 MEQ PO TBCR
40.0000 meq | EXTENDED_RELEASE_TABLET | Freq: Two times a day (BID) | ORAL | Status: AC
  Administered 2021-06-27 (×2): 40 meq via ORAL
  Filled 2021-06-27 (×2): qty 2

## 2021-06-27 MED ORDER — ENSURE ENLIVE PO LIQD
237.0000 mL | ORAL | Status: DC
  Administered 2021-06-28 – 2021-06-30 (×2): 237 mL via ORAL

## 2021-06-27 MED ORDER — MAGNESIUM SULFATE 2 GM/50ML IV SOLN
2.0000 g | Freq: Once | INTRAVENOUS | Status: AC
  Administered 2021-06-27: 2 g via INTRAVENOUS
  Filled 2021-06-27: qty 50

## 2021-06-27 NOTE — Progress Notes (Signed)
Mobility Specialist - Progress Note    06/27/21 1635  Mobility  Activity Ambulated in hall  Level of Assistance Independent after set-up  Assistive Device Other (Comment) (IV Pole)  Distance Ambulated (ft) 800 ft  Mobility Ambulated independently in hallway  Mobility Response Tolerated well  Mobility performed by Mobility specialist  $Mobility charge 1 Mobility    Pt was very pleasant and ambulated ~800 ft in hallway while pushing IV pole. Pt did not c/o of pain, SOB, or dizziness and tolerated session well. Pt returned to bed after ambulating and was left with call bell at side and bed alarm on.   [place] Specialist [facility] Services Phone: [phone] 06/27/21, 4:38 PM

## 2021-06-27 NOTE — Progress Notes (Signed)
[facility] for Infectious Disease  Date of Admission:  06/19/2021           Reason for visit: Follow up on bacteremia  Current antibiotics: ciprofloxacin  ASSESSMENT:    Acinetobacter bloodstream infection secondary to line infection.  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  Plan for 2 weeks in the setting of retained femoral line, however, she is high risk of having repeat line related infections.  End of therapy 07/09/2021 MRI of the left shoulder was reassuring.  Inflammatory markers are downtrending. Will sign off.  Please call with questions.   Principal Problem:   SIRS (systemic inflammatory response syndrome) ([name]) Active Problems:   Crohn's disease of small & large intestine with SGS   Short gut syndrome   GERD (gastroesophageal reflux disease)   Chronic pain syndrome   On total parenteral nutrition (TPN)   LFTs abnormal   Pancytopenia ([name])    MEDICATIONS:    Scheduled Meds:  (feeding supplement) PROSource Plus  30 mL Oral BID BM   acidophilus  1 capsule Oral Daily   amLODipine  10 mg Oral Daily   budesonide  9 mg Oral Daily   buPROPion ER  200 mg Oral Daily   calcium carbonate  500 mg Oral Daily   carvedilol  25 mg Oral BID WC   Chlorhexidine Gluconate Cloth  6 each Topical Daily   cholecalciferol  1,000 Units Oral Daily   ciprofloxacin  500 mg Oral BID   cycloSPORINE  1 drop Both Eyes BID   DULoxetine  120 mg Oral Daily   enoxaparin (LOVENOX) injection  40 mg Subcutaneous Q24H   estradiol  2 mg Oral Daily   hydrALAZINE  100 mg Oral TID   levETIRAcetam  500 mg Oral BID   lidocaine  1 patch Transdermal  Q24H   lipase/protease/amylase  36,000 Units Oral TID WC   methadone  5 mg Oral 5 X Daily   methocarbamol  500 mg Oral BID   multivitamin with minerals   Oral Daily   pantoprazole  40 mg Oral Daily   polyvinyl alcohol  1 drop Both Eyes QID   potassium chloride  40 mEq Oral BID   sodium chloride flush  10-40 mL Intracatheter Q12H   sucralfate  1 g Oral TID WC & HS   Teduglutide (rDNA)  1.5 mg Subcutaneous Daily   vitamin B-12  1,000 mcg Oral Daily   Continuous Infusions:  sodium chloride     sodium chloride 60 mL/hr at 06/26/21 1942   PRN Meds:.sodium chloride, acetaminophen, bisacodyl, famotidine, HYDROmorphone (DILAUDID) injection, HYDROmorphone, Muscle Rub, ondansetron **OR** ondansetron (ZOFRAN) IV, prochlorperazine, senna-docusate, sodium chloride flush  SUBJECTIVE:   24 hour events:  Status post repeat left shoulder MRI yesterday with no evidence of septic arthritis or osteomyelitis.  Afebrile.  WBC 4.3.  Continued on ciprofloxacin.  Repeat LFTs normalizing.  Inflammatory markers improved.    She denies any fevers or chills.  She is tolerating ciprofloxacin.  She reports continued left shoulder pain, however, was reassured by her negative MRI.  Review of Systems  Constitutional:  Negative for chills and fever.  Respiratory: Negative.    Cardiovascular: Negative.  Musculoskeletal:  Positive for joint pain.     OBJECTIVE:   Blood pressure (!) 199/87, pulse 64, temperature 98.6 F (37 C), temperature source Oral, resp. rate 16, height 5' 8"  (1.727 m), weight 57.6 kg, SpO2 99 %. Body mass index is 19.31 kg/m.  Physical Exam Constitutional:      Appearance: Normal appearance.  HENT:     Head: Normocephalic and atraumatic.  Pulmonary:     Effort: Pulmonary effort is normal. No respiratory distress.  Musculoskeletal:        General: No swelling. Normal range of motion.  Skin:    General: Skin is warm and dry.     Findings: No rash.  Neurological:     General: No  focal deficit present.     Mental Status: She is alert and oriented to person, place, and time.  Psychiatric:        Mood and Affect: Mood normal.        Behavior: Behavior normal.     Lab Results: Lab Results  Component Value Date   WBC 4.3 06/27/2021   HGB 8.0 (L) 06/27/2021   HCT 24.3 (L) 06/27/2021   MCV 90.3 06/27/2021   PLT 185 06/27/2021    Lab Results  Component Value Date   NA 139 06/27/2021   K 2.9 (L) 06/27/2021   CO2 26 06/27/2021   GLUCOSE 90 06/27/2021   BUN 11 06/27/2021   CREATININE 1.21 (H) 06/27/2021   CALCIUM 9.1 06/27/2021   GFRNONAA 51 (L) 06/27/2021   GFRAA 55 (L) 08/12/2020    Lab Results  Component Value Date   ALT 41 06/27/2021   AST 14 (L) 06/27/2021   ALKPHOS 143 (H) 06/27/2021   BILITOT 0.9 06/27/2021       Component Value Date/Time   CRP 0.6 06/27/2021 0340       Component Value Date/Time   ESRSEDRATE 40 (H) 06/27/2021 0340     I have reviewed the micro and lab results in Epic.  Imaging: MR CERVICAL SPINE WO CONTRAST  Result Date: 06/26/2021 CLINICAL DATA:  Cervical radiculopathy and shoulder pain with infection suspected. EXAM: MRI CERVICAL SPINE WITHOUT CONTRAST TECHNIQUE: Multiplanar, multisequence MR imaging of the cervical spine was performed. No intravenous contrast was administered. COMPARISON:  06/02/2021 FINDINGS: Motion degraded study. Alignment: Physiologic. Vertebrae: No fracture, evidence of discitis, or bone lesion. Cord: Normal signal and morphology. Posterior Fossa, vertebral arteries, paraspinal tissues: Negative. Disc levels: C1-2: Unremarkable. C2-3: Normal disc space and facet joints. There is no spinal canal stenosis. No neural foraminal stenosis. C3-4: Normal disc space and facet joints. There is no spinal canal stenosis. No neural foraminal stenosis. C4-5: Normal disc space and facet joints. There is no spinal canal stenosis. No neural foraminal stenosis. C5-6: Small disc bulge uncovertebral spurring. There is no  spinal canal stenosis. Mild right and moderate left neural foraminal stenosis. C6-7: Disc space narrowing with endplate spurring. There is no spinal canal stenosis. Mild bilateral neural foraminal stenosis. C7-T1: Normal disc space and facet joints. There is no spinal canal stenosis. No neural foraminal stenosis. IMPRESSION: 1. Motion degraded study. 2. No acute abnormality of the cervical spine. 3. Mild right and moderate left neural foraminal stenosis at C5-6. 4. Mild bilateral C6-7 neural foraminal stenosis. Electronically Signed   By: Ulyses Jarred M.D.   On: 06/26/2021 23:23   MR SHOULDER LEFT WO CONTRAST  Result Date: 06/27/2021 CLINICAL DATA:  Septic arthritis suspected, shoulder, xray done EXAM: MRI OF THE LEFT SHOULDER WITHOUT CONTRAST  TECHNIQUE: Multiplanar, multisequence MR imaging of the shoulder was performed. No intravenous contrast was administered. COMPARISON:  MRI 05/23/2021, x-ray 06/12/2021 FINDINGS: Technical note: Despite efforts by the technologist and patient, motion artifact is present on today's exam and could not be eliminated. This reduces exam sensitivity and specificity. Rotator cuff: Mild tendinosis of the distal supraspinatus tendon anteriorly. Remaining rotator cuff tendons are grossly intact. Muscles:  Mild supraspinatus intramuscular edema. Biceps long head: Grossly intact. Trace tenosynovial fluid, similar to prior. Acromioclavicular Joint: No significant arthropathy of the AC joint. No AC joint effusion. Trace subacromial-subdeltoid bursal fluid. Glenohumeral Joint: Mild chondral thinning.  No joint effusion. Labrum: Grossly intact, but evaluation is limited by motion artifact and lack of intraarticular fluid. Bones: No acute fracture or dislocation. No erosion. No bone marrow edema. No suspicious bone lesion. Other: No soft tissue edema or fluid collection. No visible lymphadenopathy. IMPRESSION: 1. No evidence of septic arthritis or osteomyelitis involving the left shoulder.  2. Mild tendinosis of the distal supraspinatus tendon anteriorly. Negative for rotator cuff tear. 3. Mild supraspinatus intramuscular edema which could reflect a muscle strain. Early changes of myositis could have a similar appearance. Electronically Signed   By: Davina Poke D.O.   On: 06/27/2021 08:09     Imaging independently reviewed in Epic.    [name] for Infectious Disease [facility] [phone] pager 06/27/2021, 11:33 AM

## 2021-06-27 NOTE — Progress Notes (Signed)
Palliative:  HPI: 60 y.o. 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 Ongoing pain to left shoulder and palliative requested to assist with management.    I visited with Caitlin Peters today. She is awake and alert. She complains of her pain now in neck and radiating down arm and notes it is "different today" and notes "shooting" pains. I discussed with her transition from IV to po medication. We discussed consideration of rotating to oxycodone to see if this is more effective for her than her dilaudid (she has been on dilaudid a long time - may need rotation to get improved efficacy). I explained that I have left a message for Dr. Royce Macadamia for his input and suggestions since he is most familiar with her and her pain history. Caitlin Peters speaks very highly of Dr. Royce Macadamia and trusts his input. Upon discussing different ideas to manage her pain (liquid oxy specifically) she becomes tearful and says that she is tired of being a [place] pig and trying things. She explains that she always uses IV dilaudid in the hospital because she needs improved relief due to increased pain with due to recurrent infections. She explains how the po medication does not absorb and is not effective. She talks of being tired of dealing with her pain and infections caused by her Crohn's but then follows up by talking of her faith and strength to overcome and "get through this time like I always do."   I will await return call from Dr. Royce Macadamia. I will reach out to her daughter, Caitlin Peters, to discuss further tomorrow. All questions/concerns addressed. Emotional support provided.   Exam: Alert, oriented. Thin, frail. Improved memory and insight today. No distress. L arm/shoulder no limitation in  movement. She is rubbing her left neck throughout our visit. Breathing regular, unlabored. Abd soft, flat. Moves all extremities.   Plan: Caitlin Peters confirms her desire for full scope care.  Bowel Regimen: LBM 7/30. Appears BM ~ every other day. L shoulder pain: Question if this is referred pain radiating from cervical spine. Describes as more neuropathic in nature. Plans for MRI shoulder/neck. Continue methadone. May consider increasing but will not change without coordination with Dr. Royce Macadamia first. Consider opioid rotation and would consider trial OxyIR 20 mg (concentrated oxycodone intensol solution so will not need to absorb in GI tract) every 4 hours trial in place of dilaudid. Continue Robaxin. Increase as needed. Continue Tylenol 1000 mg TID added. Continue Cymbalta to 120 mg daily. Continue Lidoderm patch to L neck/shoulder. Allergy to gabapentin - will avoid.   [place], NP Palliative Medicine Team Pager [phone] (Please see [email or web address] for schedule) Team Phone [phone]    Greater than 50%  of this time was spent counseling and coordinating care related to the above assessment and plan

## 2021-06-27 NOTE — Progress Notes (Signed)
NUTRITION NOTE  Secure chat message received from Pharmacist and consult placed for plan to resume TPN tomorrow (8/3). Noted to have double lumen PICC in R femoral which was placed on 04/03/21.  RD will continue to follow per protocol.     Jarome Matin, MS, RD, LDN, CNSC Inpatient Clinical Dietitian RD pager # available in [place]  After hours/weekend pager # available in [facility]

## 2021-06-27 NOTE — Progress Notes (Signed)
Nutrition Follow-up  DOCUMENTATION CODES:   Not applicable  INTERVENTION:  - continue 30 ml Prosource Plus BID. - will order Boost Breeze once/day, each supplement provides 250 kcal and 9 grams of protein. -will order Ensure Enlive once/day, each supplement provides 350 kcal and 20 grams of protein. - TPN re-start when feasible.    NUTRITION DIAGNOSIS:   Increased nutrient needs related to acute illness, chronic illness as evidenced by estimated needs. -ongoing  GOAL:   Patient will meet greater than or equal to 90% of their needs -unmet, unable to meet at this time.   MONITOR:   PO intake, Supplement acceptance, Labs, Weight trends, I & O's, Other (Comment) (TPN regimen)   ASSESSMENT:   60 y.o. female with medical history for short gut syndrome requiring TPN, Crohn's disease s/p multiple surgeries, chronic pain, chronic PICC line, HTN, vitamin B12 deficiency, osteoporosis, depression, GERD, stage 3 CKD, diverticulosis, avascular necrosis of the bone, iron deficiency anemia. She presented to the ED on 7/25 with fever and chills.  Recently documented meal intakes: 7/27- 100% of breakfast 7/28- 50% of breakfast  7/31- 0% of breakfast, 100% of lunch, 0% of dinner 8/1- 25% of dinner  She has been accepting Prosource 100% of the time offered. Weight today is -4 lb compared to admission weight. Weight has been fluctuating daily since admission on 7/25. Non-pitting edema to LUE documented in the edema section of flow sheet.   She is currently out of the room to MRI.  Per notes: - sepsis on admission 2/2 bacteremia - cervical MRI and MRI of L shoulder d/t ongoing L shoulder and L-sided neck pain - hx of short gut syndrome on home TPN 2/2 to this--TPN being held d/t bacteremia    Labs reviewed; K: 2.9 mmol/l, creatinine: 1.21 mg/dl, Mg: 1.6 mg/dl, Alk Phos elevated, GFR: 51 ml/min.  Medications reviewed; 1 capsule risaquad/day, 1000 units cholecalciferol/day, 36000 units creon  TID with meals, 1 tablet multivitamin with minerals/day, 40 mg oral protonix/day, 40 mEq Klor-Con BID, 1 g carafate TID, 1000 mcg oral cyanocobalamin/day.  IVF; NS @ 60 ml/hr.   Diet Order:   Diet Order             Diet regular Room service appropriate? Yes; Fluid consistency: Thin  Diet effective now                   EDUCATION NEEDS:   No education needs have been identified at this time  Skin:  Skin Assessment: Reviewed RN Assessment  Last BM:  7/30  Height:   Ht Readings from Last 1 Encounters:  06/25/21 5' 8"  (1.727 m)    Weight:   Wt Readings from Last 1 Encounters:  06/27/21 57.6 kg      Estimated Nutritional Needs:  Kcal:  1800-2000 kcal Protein:  90-100 grams Fluid:  >/= 2.2 L/day      Jarome Matin, MS, RD, LDN, CNSC Inpatient Clinical Dietitian RD pager # available in AMION  After hours/weekend pager # available in [facility]

## 2021-06-27 NOTE — Progress Notes (Signed)
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  Assessment: 60 year old female on chronic TPN followed by [facility] for short gut syndrome admitted for sepsis from unknown source (possible bacteremia).  Patient is on cyclic TPN over 12 hours at home.  Of note, previous admission in June 2022- TPN was held for a short period of time due to elevated liver enzymes.  TPN started on 7/27 and d/ced on 7/28 d/t bacteremia.Patient is now being treated with abx for acinetobacter bacteremia secondary to line infection.  ID team is ok with resuming TPN back on 06/28/21.  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, Crohn's s/p multiple SBR's, short gut syndrome, chronic diarrhea, anasarca, h/o diverticulosis, B12 deficiency, GERD  Glucose / Insulin: not on insulin - CBGs (goal <150): wnl from CMET Electrolytes: K 2.9, phos 1.6; CorrCa 10.06; other lytes wnl Renal: Scr elevated but trending down with 1.21 (crcl~45) Hepatic: AST/ALT and Tbili wnl; alk phoa 143 -  prealbumin 22.9 (7/27), TG 91 (7/27) Intake / Output; MIVF: NS at 60 ml/hr - I/O: +712 mL GI Imaging: - 7/25 abd CT: Avascular necrosis of the left femoral head without acute fracture or cortical collapse GI Surgeries / Procedures: n/a Ordered GI Medications: Creon 36,000 units TID w/ meals, Carafate 1g TID w/ meals and QHS, PPI, pepcid prn  Central access: PICC line placed 04/03/21 TPN start date: on TPN PTA  Nutritional Goals (Dietician recom on 8/2): Kcal:  1800-2000 kcal Protein:  90-100 grams Fluid:  >/= 2.2 L/day  PTA TPN Rx (see media tab for Rx image): total 1088 kCal (w/o Lipids) and 1688 kcal w/ lipids; 340 kcal AA, 748 kcal CHO -Formula w/o lipids 4 days/week- receives SMOFlipid 60g on MWF. Adds Infuvite 10 mL  and Tralement 67m to each bag daily.  -Dextrose 70%= 220g/day; Plenamine 15% = 85g/day; Kphos 24 mmol/day; K acetate 57 mEq/day; Mg 10 mEq/day; CaGlu 10 mEq/day; Na Acetate 76 mEq/day  ++ Will dose inpatient TPN using dietician's recom. for nutritional goals ++  Current Nutrition:  - Prosource plus bid - 8/2: boost 1 container daily  and ensure 237 ml/ daily ordered - on regular diet   Plan:  - Discussed with Dr. SAlfredia Ferguson will resume TPN back on 06/28/21  ADia Sitter PharmD, BCPS 06/27/2021 1:20 PM

## 2021-06-27 NOTE — Progress Notes (Signed)
PROGRESS NOTE    Caitlin Peters  TKP:[id number] DOB: [birth year] DOA: 06/19/2021 PCP: [name], [name], MD   Brief Narrative:  The patient is a 60 year old African-American female with a past medical history significant for but not limited to short gut syndrome requiring TPN, history of Crohn's disease with multiple surgeries, chronic pain syndrome, history of chronic PICC line for IV access as well as other comorbidities including chronic pancreatitis who presented to the ED on 06/19/2021 with fever and chills.  She reports that she has not felt well since 05/29/2021 when she was hospitalized late in June 2022.  Since leaving the hospital she has noticed pain in her left shoulder and arm.  She states that fever and chills began the day before admission and has been intermittent.  She reported diaphoresis at night and her fever at home was as high as 102.  She also admits that chronic abdominal pain located upper abdomen on the left side that radiates to the back that is 7 out of 10 and characterized as cramping and sharp as well as dull at times.  She states that her abdominal pain is alleviated by nothing and exacerbated by eating.  She had fevers and chills that were alleviated by nothing and exacerbated by nothing.  Associated symptoms include nausea and vomiting intermittently.  She states that she has chronic diarrhea more like Jell-O consistency but did not notice any blood and did not have any hematuria or dysuria.  She presented the ED and she is found to be febrile at 108 with a pulse rate of 104.  She was leukopenic and did have abnormal LFTs which are now improving.  Urinalysis showed no leukocytes or nitrites and cultures were drawn however.  She was placed on IV antibiotics with IV vancomycin and IV ceftriaxone and now changed to IV Cefepemime by ID given her GNR Bacteremia. Cx's are growing Acinetobacter Aldean Ast is responding to cefepime.  ID recommending continue IV cefepime while  hospitalized and possibly changing to p.o. ciprofloxacin at discharge.  They are recommending continuing her central access in place given her difficult access.  Pain was relatively uncontrolled so we will consult palliative care for further assistance with pain management.  She had low-grade temperatures overnight so ID repeating blood cultures and changed to p.o. ciprofloxacin.  ID recommending continuing for total 2 weeks in the setting of her retained femoral line.  Given her increased shoulder pain despite pain regimen we will repeat an MRI and ID is in agreement.  If she does not continue to improve we will obtain an orthopedic evaluation.  We will also scan the patient's neck given that she may have some radicular pain and ID is recommending checking her ESR and CRP.   MRI Neck done and showed "Motion degraded study. No acute abnormality of the cervical spine. Mild right and moderate left neural foraminal stenosis at C5-6. Mild bilateral C6-7 neural foraminal stenosis." MRI Left Shoulder done and showed ". No evidence of septic arthritis or osteomyelitis involving the left shoulder. Mild tendinosis of the distal supraspinatus tendon anteriorly. Negative for rotator cuff tear. Mild  supraspinatus intramuscular edema which could reflect a muscle strain. Early changes of myositis could have a similar appearance."  Pain is still relatively uncontrolled so now trial Oxy IR in addition to her other regimen as we can still not get in touch with her regular pain physician.  Now ID is changed her recommendation to stop on 07/09/2021 with p.o. ciprofloxacin and they  have signed off the case given that her inflammatory markers are downtrending.    Assessment & Plan:   Principal Problem:   SIRS (systemic inflammatory response syndrome) ([name]) Active Problems:   Crohn's disease of small & large intestine with SGS   Short gut syndrome   GERD (gastroesophageal reflux disease)   Chronic pain syndrome   On  total parenteral nutrition (TPN)   LFTs abnormal   Pancytopenia ([place])  Sepsis 2/2 to Acinetobacter Ursingii Bacteremia, poA  -Present on admission and in the setting of GNR Bacteremia. Source: No known source of infection, but could have a PICC line infection or an abdominal infection.  -She does have a history of PICC line infections previously -Sepsis order set. Cultures ordered. And Blood Cx x2 showing GNR but Sensitivities as above -ID consulted now changed her to p.o. ciprofloxacin 750 mg recommending continuing at discharge until 07/04/2021 previously but now have changed the recommendation to 07/09/2021 -She was given IV fluids to provide volume and remains on Prosource +30 mL p.o. twice daily and is being continued on Normal saline at 60 mL/h -Monitor for signs of volume depletion; monitor blood pressure carefully.  -Close monitoring of temperature curve and WBC; WBC has gone from 2.9 -> 3.8 -> 4.6 -> 3.5 -> 2.5 -> 3.2 -> 3.0 and is now 3.3 yesterday and today it is 4.9 and improved yesterday and today it is 4.3 -Initial lactic acid was normal and repeat was also normal  -Because she had low-grade temperatures with a T-max of 100.2 ID is recommending repeating blood cultures and this has been done -Given her Gram Negative Bacteremia ID feels no absolute indication for line exchange but recommends continuing ciprofloxacin.  Given her continued pain in her left shoulder and left-sided neck we will obtain a cervical MRI as well as a MRI of the left shoulder -Appreciate ID Recc's and now they have signed off the case and recommending continuing antibiotics 07/09/2021  Pancytopenia [facility]) -May be related to sepsis as above -WBC was 3.5, hemoglobin/hematocrit was 7.4/23.5, and platelet count was 114; -Now WBC is 4.3, Hgb/Hct is 8.0/24.3 and Platelet Count is improved and now at 185 -Continue monitor and trend and repeat CBC in a.m.   Short gut syndrome -With chronic TPN use.   -Holding home TPN  given Bacteremia but now okay to resume -We will resume TPN as ID has cleared the patient   Abnormal LFTs, improving  In the setting of TPN and Bacteremic infection -Patient's AST on admission was 544 is now trended down to 14 and patient's ALT was 365 and has now trended down to 41 and improved -Monitor and trend LFTs and if necessary will obtain a right upper quadrant ultrasound as well as an acute hepatitis panel -Will not repeat a CMP in the a.m. given that her LFTs are now normalized  GERD (gastroesophageal reflux disease) -Continue home medications with pantoprazole 40 mg p.o. daily as well as famotidine 20 mg p.o. daily as needed heartburn and indigestion -Also continue sucralfate 1 g p.o. 3 times daily  Hypokalemia -Patient's K+ is now 2.9 -Replete with p.o. KCl 40 mg twice daily x2 doses -Continue to Monitor and Replete as Necessary -Repeat CMP in the AM  Hypophosphatemia -Patient's phosphorus level was 2.0 and improved to 3.5 -Continue monitor and replete as necessary -Repeat phosphorus level in a.m.   Chronic Pain Syndrome -Continue home medication with methadone 5 mg p.o. 5 times daily as well as p.o. hydromorphone 4 mg every 4  hours as needed severe pain as well as IV hydromorphone 1 mg every 2 hours as needed severe pain -She has used 46 IV Doses of Hydromorphone given her pain so far -Continue with methocarbamol 500 g p.o. twice daily -She is still having significant Pain so will consult Palliative Care for further Symptom Management; palliative care consultation done today and they are going to attempt to reach the patient primary pain provider; we will schedule her acetaminophen 1000 mg every 8, increase her Cymbalta to 120 mg p.o. daily as well as apply lidocaine patch the patient is neck is she is likely having some referred pain.  We will also obtain further imaging of the neck and left shoulder and these were relatively unremarkable but did show evidence of a muscle  strain -Palliative working with pain assistance and going to trial Oxy IR we will still need to try and touch base with her regular pain physician outpatient setting   Crohn's disease of the small and large intestine with short gut syndrome  -Continued home TPN but held given GNR bacteremia from Acetobacter -Continue with home budesonide 9 mg p.o. daily and with teduglutide 1.5 mg subcu daily for Short Gut -Follow[facility]  Left shoulder pain -Had an MRI towards the end of June and did not show any acute findings and is seeing her primary care provider -She has been referred to orthopedic surgery and has appointment August 1. -As above; continues to have some left shoulder pain significantly so we will repeat an MRI given possible seeding and will also check a cervical spine MRI given her referred pain from her neck and down her left arm and shoulder -MRI Neck done and showed "Motion degraded study. No acute abnormality of the cervical spine. Mild right and moderate left neural foraminal stenosis at C5-6. Mild bilateral C6-7 neural foraminal stenosis."  -MRI Left Shoulder done and showed ". No evidence of septic arthritis or osteomyelitis involving the left shoulder. Mild tendinosis of the distal supraspinatus tendon anteriorly. Negative for rotator cuff tear. Mild  supraspinatus intramuscular edema which could reflect a muscle strain. Early changes of myositis could have a similar appearance." -Pending on evaluation she may need orthopedics to see her while she is in-house however given MRI findings we will hold off  AKI on chronic kidney disease stage IIIa Metabolic acidosis -Stable and improved.  Now BUN/Cr is 18/1.51 -> 15/1.43 -> 14/1.40 and is now further improved to 12/1.26  -> 11/1.21 and now within her Baseline  -Continue with IV fluid hydration as above -Patient had a small metabolic acidosis with a CO2 21, anion gap of 11, chloride level of 108; Now CO2 is 23, AG is  5, and Chloride Level is 107 -Avoid further nephrotoxic medications, contrast dyes, hypotension and renally dose medications Repeat CMP in a.m.  Depression and Anxiety -Continue with Bupropion ER 20 g p.o. daily; Duloxetine 90 mg p.o. daily increased by palliative to 120 mg p.o. daily  Hypomagnesemia -Patient's mag level is now 1.6 -Replete with IV mag sulfate 2 g  -Continue to monitor and replete as necessary -Repeat CMP in a.m.  Hypertension -Continue to monitor blood pressures per protocol  -Continue with Amlodipine 10 mg p.o. daily as well as Carvedilol 25 mg p.o. twice daily -Continue to Monitor BP per Protocol -Last BP was 137/73 and was elevated this morning to 199/87 in the setting of pain  History of Recurrent Pancreatitis -Continue with Creon 36,000 units p.o. 3 times daily with  meals  DVT prophylaxis: Enoxaparin 40 mg sq q24h Code Status: FULL CODE  Family Communication: No family present at bedside Disposition Plan: Pending further clinical improvement and clearance by ID and Pain has to be controlled   Status is: Inpatient  Remains inpatient appropriate because:Unsafe d/c plan, IV treatments appropriate due to intensity of illness or inability to take PO, and Inpatient level of care appropriate due to severity of illness  Dispo:  Patient From: Home  Planned Disposition: Home with Health Care Svc  Medically stable for discharge: No    Consultants:  Infectious Diseases  Palliative Care Medicine   Procedures: None  Antimicrobials:  Anti-infectives (From admission, onward)    Start     Dose/Rate Route Frequency Ordered Stop   06/26/21 2000  ciprofloxacin (CIPRO) tablet 500 mg        500 mg Oral 2 times daily 06/26/21 1021 07/09/21 2359   06/26/21 0900  ciprofloxacin (CIPRO) tablet 750 mg  Status:  Discontinued        750 mg Oral 2 times daily 06/26/21 0812 06/26/21 1021   06/25/21 1400  ciprofloxacin (CIPRO) tablet 750 mg  Status:  Discontinued        750  mg Oral Daily 06/25/21 1218 06/26/21 0812   06/23/21 1600  ceFEPIme (MAXIPIME) 2 g in sodium chloride 0.9 % 100 mL IVPB  Status:  Discontinued        2 g 200 mL/hr over 30 Minutes Intravenous Every 12 hours 06/22/21 1452 06/23/21 0843   06/23/21 1000  ceFEPIme (MAXIPIME) 2 g in sodium chloride 0.9 % 100 mL IVPB  Status:  Discontinued        2 g 200 mL/hr over 30 Minutes Intravenous Every 12 hours 06/23/21 0843 06/25/21 1218   06/22/21 1300  ceFEPIme (MAXIPIME) 2 g in sodium chloride 0.9 % 100 mL IVPB  Status:  Discontinued        2 g 200 mL/hr over 30 Minutes Intravenous Every 12 hours 06/22/21 1213 06/22/21 1452   06/20/21 2300  cefTRIAXone (ROCEPHIN) 2 g in sodium chloride 0.9 % 100 mL IVPB  Status:  Discontinued        2 g 200 mL/hr over 30 Minutes Intravenous Every 24 hours 06/20/21 0354 06/22/21 1213   06/20/21 2200  vancomycin (VANCOREADY) IVPB 750 mg/150 mL  Status:  Discontinued        750 mg 150 mL/hr over 60 Minutes Intravenous Every 24 hours 06/20/21 0152 06/22/21 1213   06/20/21 0400  metroNIDAZOLE (FLAGYL) IVPB 500 mg  Status:  Discontinued        500 mg 100 mL/hr over 60 Minutes Intravenous Every 8 hours 06/20/21 0354 06/22/21 1213   06/19/21 2345  cefTRIAXone (ROCEPHIN) 1 g in sodium chloride 0.9 % 100 mL IVPB        1 g 200 mL/hr over 30 Minutes Intravenous  Once 06/19/21 2341 06/20/21 0151   06/19/21 2300  cefTRIAXone (ROCEPHIN) 1 g in sodium chloride 0.9 % 100 mL IVPB        1 g 200 mL/hr over 30 Minutes Intravenous  Once 06/19/21 2253 06/20/21 0011   06/19/21 2300  vancomycin (VANCOCIN) IVPB 1000 mg/200 mL premix        1,000 mg 200 mL/hr over 60 Minutes Intravenous  Once 06/19/21 2253 06/20/21 0113        Subjective: Seen and examined at bedside she is complaining of significant pain again and was in tears this morning.  He denied any  chest pain or shortness breath and still has some abdominal pain.  Feels about the same as yesterday and did not have any  improvement in her pain.  No other concerns or complaints at this time.  Objective: Vitals:   06/26/21 1944 06/27/21 0608 06/27/21 0613 06/27/21 1307  BP: (!) 164/84 (!) 199/87  (!) 157/73  Pulse: 67 64  69  Resp:    16  Temp: 98.7 F (37.1 C) 98.6 F (37 C)  98.5 F (36.9 C)  TempSrc: Oral Oral  Oral  SpO2: 99% 99%  99%  Weight:   57.6 kg   Height:        Intake/Output Summary (Last 24 hours) at 06/27/2021 1825 Last data filed at 06/27/2021 6962 Gross per 24 hour  Intake 340 ml  Output --  Net 340 ml    Filed Weights   06/24/21 0458 06/25/21 0435 06/27/21 9528  Weight: 70.9 kg 66.1 kg 57.6 kg   Examination: Physical Exam:  Constitutional: WN/WD, chronically ill-appearing African-American female currently in moderate distress still complaining of significant pain and very tearful this morning again Eyes: Lids and conjunctivae normal, sclerae anicteric  ENMT: External Ears, Nose appear normal. Grossly normal hearing.  Neck: Appears normal, supple, no cervical masses, normal ROM, no appreciable thyromegaly; no JVD Respiratory: Diminished to auscultation bilaterally, no wheezing, rales, rhonchi or crackles. Normal respiratory effort and patient is not tachypenic. No accessory muscle use.  Unlabored breathing Cardiovascular: RRR, no murmurs / rubs / gallops. S1 and S2 auscultated. No extremity edema.  Abdomen: Soft, non-tender, distended. No masses palpated. Bowel sounds positive.  GU: Deferred. Musculoskeletal: No clubbing / cyanosis of digits/nails. No joint deformity upper and lower extremities.  Skin: No rashes, lesions, ulcers on limited skin evaluation. No induration; Warm and dry.  Neurologic: CN 2-12 grossly intact with no focal deficits. Romberg sign and cerebellar reflexes not assessed.  Psychiatric: Normal judgment and insight. Alert and oriented x 3.  Anxious and tearful mood and appropriate affect.    Data Reviewed: I have personally reviewed following labs and  imaging studies  CBC: Recent Labs  Lab 06/23/21 0413 06/24/21 0402 06/25/21 0325 06/26/21 0410 06/27/21 0340  WBC 3.2* 3.0* 3.3* 4.9 4.3  NEUTROABS 1.4* 1.3* 1.5* 2.8 2.1  HGB 7.6* 7.8* 7.6* 8.5* 8.0*  HCT 23.8* 24.0* 23.6* 25.9* 24.3*  MCV 96.0 95.6 93.7 91.5 90.3  PLT 121* 154 168 205 413    Basic Metabolic Panel: Recent Labs  Lab 06/23/21 0413 06/24/21 0402 06/25/21 0325 06/26/21 0410 06/27/21 0340  NA 138 139 140 135 139  K 3.1* 3.4* 3.2* 3.6 2.9*  CL 111 112* 112* 107 104  CO2 22 22 23 23 26   GLUCOSE 101* 97 81 107* 90  BUN 18 15 14 12 11   CREATININE 1.51* 1.43* 1.40* 1.26* 1.21*  CALCIUM 8.4* 8.6* 9.1 9.5 9.1  MG 1.8 1.7 2.1 1.7 1.6*  PHOS 3.5 2.9 2.6 2.0* 3.5    GFR: Estimated Creatinine Clearance: 45 mL/min (A) (by C-G formula based on SCr of 1.21 mg/dL (H)). Liver Function Tests: Recent Labs  Lab 06/23/21 0413 06/24/21 0402 06/25/21 0325 06/26/21 0410 06/27/21 0340  AST 22 17 14* 15 14*  ALT 93* 73* 57* 54* 41  ALKPHOS 154* 153* 132* 158* 143*  BILITOT 0.5 0.5 0.6 0.7 0.9  PROT 5.5* 5.7* 5.7* 6.8 6.2*  ALBUMIN 2.4* 2.5* 2.6* 3.0* 2.8*    No results for input(s): LIPASE, AMYLASE in the last 168 hours.  No results for input(s): AMMONIA in the last 168 hours. Coagulation Profile: No results for input(s): INR, PROTIME in the last 168 hours.  Cardiac Enzymes: No results for input(s): CKTOTAL, CKMB, CKMBINDEX, TROPONINI in the last 168 hours. BNP (last 3 results) No results for input(s): PROBNP in the last 8760 hours. HbA1C: No results for input(s): HGBA1C in the last 72 hours. CBG: Recent Labs  Lab 06/24/21 1129 06/24/21 1644 06/24/21 2359 06/25/21 0528 06/25/21 1130  GLUCAP 92 108* 124* 107* 94    Lipid Profile: No results for input(s): CHOL, HDL, LDLCALC, TRIG, CHOLHDL, LDLDIRECT in the last 72 hours.  Thyroid Function Tests: No results for input(s): TSH, T4TOTAL, FREET4, T3FREE, THYROIDAB in the last 72 hours. Anemia  Panel: No results for input(s): VITAMINB12, FOLATE, FERRITIN, TIBC, IRON, RETICCTPCT in the last 72 hours. Sepsis Labs: No results for input(s): PROCALCITON, LATICACIDVEN in the last 168 hours.  Recent Results (from the past 240 hour(s))  Resp Panel by RT-PCR (Flu A&B, Covid) Nasopharyngeal Swab     Status: None   Collection Time: 06/19/21  9:20 PM   Specimen: Nasopharyngeal Swab; Nasopharyngeal(NP) swabs in vial transport medium  Result Value Ref Range Status   SARS Coronavirus 2 by RT PCR NEGATIVE NEGATIVE Final    Comment: (NOTE) SARS-CoV-2 target nucleic acids are NOT DETECTED.  The SARS-CoV-2 RNA is generally detectable in upper respiratory specimens during the acute phase of infection. The lowest concentration of SARS-CoV-2 viral copies this assay can detect is 138 copies/mL. A negative result does not preclude SARS-Cov-2 infection and should not be used as the sole basis for treatment or other patient management decisions. A negative result may occur with  improper specimen collection/handling, submission of specimen other than nasopharyngeal swab, presence of viral mutation(s) within the areas targeted by this assay, and inadequate number of viral copies(<138 copies/mL). A negative result must be combined with clinical observations, patient history, and epidemiological information. The expected result is Negative.  Fact Sheet for Patients:  EntrepreneurPulse.com.au  Fact Sheet for Healthcare Providers:  IncredibleEmployment.be  This test is no t yet approved or cleared by the [place] FDA and  has been authorized for detection and/or diagnosis of SARS-CoV-2 by FDA under an Emergency Use Authorization (EUA). This EUA will remain  in effect (meaning this test can be used) for the duration of the COVID-19 declaration under Section 564(b)(1) of the Act, 21 U.S.C.section 360bbb-3(b)(1), unless the authorization is terminated  or  revoked sooner.       Influenza A by PCR NEGATIVE NEGATIVE Final   Influenza B by PCR NEGATIVE NEGATIVE Final    Comment: (NOTE) The Xpert Xpress SARS-CoV-2/FLU/RSV plus assay is intended as an aid in the diagnosis of influenza from Nasopharyngeal swab specimens and should not be used as a sole basis for treatment. Nasal washings and aspirates are unacceptable for Xpert Xpress SARS-CoV-2/FLU/RSV testing.  Fact Sheet for Patients: EntrepreneurPulse.com.au  Fact Sheet for Healthcare Providers: IncredibleEmployment.be  This test is not yet approved or cleared by the [place] FDA and has been authorized for detection and/or diagnosis of SARS-CoV-2 by FDA under an Emergency Use Authorization (EUA). This EUA will remain in effect (meaning this test can be used) for the duration of the COVID-19 declaration under Section 564(b)(1) of the Act, 21 U.S.C. section 360bbb-3(b)(1), unless the authorization is terminated or revoked.  Performed at [facility], [place] [address] [place], [place] [address]   Urine Culture     Status: Abnormal  Collection Time: 06/19/21  9:45 PM   Specimen: In/Out Cath Urine  Result Value Ref Range Status   Specimen Description   Final    IN/OUT CATH URINE Performed at [facility], [place] [address] [place], [place] [address]    Special Requests   Final    NONE Performed at [facility], [place] [address], [place], [place] [address]    Culture (A)  Final    4,000 COLONIES/mL STAPHYLOCOCCUS EPIDERMIDIS 10,000 COLONIES/mL LACTOBACILLUS SPECIES Standardized susceptibility testing for this organism is not available. Performed at [facility], [place] [address], [place], [place] [address]    Report Status 06/21/2021 FINAL  Final  Blood Culture (routine x 2)     Status: Abnormal   Collection Time: 06/19/21  9:45 PM   Specimen: BLOOD  Result Value Ref Range  Status   Specimen Description   Final    BLOOD RIGHT ANTECUBITAL Performed at [place] [address], [place], [place] [address]    Special Requests   Final    BOTTLES DRAWN AEROBIC AND ANAEROBIC Blood Culture adequate volume Performed at [place] [address], [place], [place] [address]    Culture  Setup Time   Final    GRAM NEGATIVE RODS AEROBIC BOTTLE ONLY CRITICAL RESULT CALLED TO, READ BACK BY AND VERIFIED WITH: PHARMD M TUCKER [id number] FCP CRITICAL RESULT CALLED TO, READ BACK BY AND VERIFIED WITH: pharmd Dillard [place] 06/22/21@11 :25 by tw    Culture (A)  Final    ACINETOBACTER URSINGII CRITICAL RESULT CALLED TO, READ BACK BY AND VERIFIED WITH: Trude Mcburney [id number] FCP Performed at [facility], [place] [address], [place], [place] [address]    Report Status 06/25/2021 FINAL  Final   Organism ID, Bacteria ACINETOBACTER URSINGII  Final      Susceptibility   Acinetobacter ursingii - MIC*    CEFTAZIDIME 8 SENSITIVE Sensitive     CIPROFLOXACIN 0.5 SENSITIVE Sensitive     GENTAMICIN <=1 SENSITIVE Sensitive     IMIPENEM <=0.25 SENSITIVE Sensitive     TRIMETH/SULFA 80 RESISTANT Resistant     AMPICILLIN/SULBACTAM <=2 SENSITIVE Sensitive     * ACINETOBACTER URSINGII  Blood Culture ID Panel (Reflexed)     Status: None   Collection Time: 06/19/21  9:45 PM  Result Value Ref Range Status   Enterococcus faecalis NOT DETECTED NOT DETECTED Final   Enterococcus Faecium NOT DETECTED NOT DETECTED Final   Listeria monocytogenes NOT DETECTED NOT DETECTED Final   Staphylococcus species NOT DETECTED NOT DETECTED Final   Staphylococcus aureus (BCID) NOT DETECTED NOT DETECTED Final   Staphylococcus epidermidis NOT DETECTED NOT DETECTED Final   Staphylococcus lugdunensis NOT DETECTED NOT DETECTED Final   Streptococcus species NOT DETECTED NOT DETECTED Final   Streptococcus agalactiae NOT DETECTED NOT DETECTED Final   Streptococcus  pneumoniae NOT DETECTED NOT DETECTED Final   Streptococcus pyogenes NOT DETECTED NOT DETECTED Final   A.calcoaceticus-baumannii NOT DETECTED NOT DETECTED Final   Bacteroides fragilis NOT DETECTED NOT DETECTED Final   Enterobacterales NOT DETECTED NOT DETECTED Final   Enterobacter cloacae complex NOT DETECTED NOT DETECTED Final   Escherichia coli NOT DETECTED NOT DETECTED Final   Klebsiella aerogenes NOT DETECTED NOT DETECTED Final   Klebsiella oxytoca NOT DETECTED NOT DETECTED Final   Klebsiella pneumoniae NOT DETECTED NOT DETECTED Final   Proteus species NOT DETECTED NOT DETECTED Final   Salmonella species NOT DETECTED NOT DETECTED Final  Serratia marcescens NOT DETECTED NOT DETECTED Final   Haemophilus influenzae NOT DETECTED NOT DETECTED Final   Neisseria meningitidis NOT DETECTED NOT DETECTED Final   Pseudomonas aeruginosa NOT DETECTED NOT DETECTED Final   Stenotrophomonas maltophilia NOT DETECTED NOT DETECTED Final   Candida albicans NOT DETECTED NOT DETECTED Final   Candida auris NOT DETECTED NOT DETECTED Final   Candida glabrata NOT DETECTED NOT DETECTED Final   Candida krusei NOT DETECTED NOT DETECTED Final   Candida parapsilosis NOT DETECTED NOT DETECTED Final   Candida tropicalis NOT DETECTED NOT DETECTED Final   Cryptococcus neoformans/gattii NOT DETECTED NOT DETECTED Final    Comment: Performed at [facility], [place] [address], [place], [place] [address]  MRSA Next Gen by PCR, Nasal     Status: None   Collection Time: 06/20/21  6:27 PM   Specimen: Nasal Mucosa; Nasal Swab  Result Value Ref Range Status   MRSA by PCR Next Gen NOT DETECTED NOT DETECTED Final    Comment: (NOTE) The GeneXpert MRSA Assay (FDA approved for NASAL specimens only), is one component of a comprehensive MRSA colonization surveillance program. It is not intended to diagnose MRSA infection nor to guide or monitor treatment for MRSA infections. Test performance is not FDA approved in patients  less than 57 years old. Performed at [facility], [place] [address], [place], [place] [address]   Culture, blood (routine x 2)     Status: None (Preliminary result)   Collection Time: 06/25/21 12:49 PM   Specimen: BLOOD  Result Value Ref Range Status   Specimen Description   Final    BLOOD RIGHT ANTECUBITAL Performed at [facility] [address] [place], [name] [address]    Special Requests   Final    BOTTLES DRAWN AEROBIC ONLY Blood Culture results may not be optimal due to an inadequate volume of blood received in culture bottles Performed at [facility] [address], [place], [place] [address]    Culture   Final    NO GROWTH 2 DAYS Performed at [facility] [address], [place], [place] [address]    Report Status PENDING  Incomplete  Culture, blood (routine x 2)     Status: None (Preliminary result)   Collection Time: 06/25/21  1:38 PM   Specimen: BLOOD  Result Value Ref Range Status   Specimen Description   Final    BLOOD BLOOD RIGHT HAND Performed at [facility] [address] [place], [place] [address]    Special Requests   Final    BOTTLES DRAWN AEROBIC ONLY Blood Culture results may not be optimal due to an inadequate volume of blood received in culture bottles Performed at [facility] [address], [place],  [address]    Culture   Final    NO GROWTH 2 DAYS Performed at [facility] [address] [place],  [address]    Report Status PENDING  Incomplete    RN Pressure Injury Documentation:     Estimated body mass index is 19.31 kg/m as calculated from the following:   Height as of this encounter: 5' 8"  (1.727 m).   Weight as of this encounter: 57.6 kg.  Malnutrition Type:  Nutrition Problem: Increased nutrient needs Etiology: acute illness, chronic illness   Malnutrition Characteristics:  Signs/Symptoms: estimated  needs   Nutrition Interventions:  Interventions: Boost Breeze, Ensure Enlive (each supplement provides 350kcal and 20 grams  of protein), Prostat, MVI     Radiology Studies: MR CERVICAL SPINE WO CONTRAST  Result Date: 06/26/2021 CLINICAL DATA:  Cervical radiculopathy and shoulder pain with infection suspected. EXAM: MRI CERVICAL SPINE WITHOUT CONTRAST TECHNIQUE: Multiplanar, multisequence MR imaging of the cervical spine was performed. No intravenous contrast was administered. COMPARISON:  06/02/2021 FINDINGS: Motion degraded study. Alignment: Physiologic. Vertebrae: No fracture, evidence of discitis, or bone lesion. Cord: Normal signal and morphology. Posterior Fossa, vertebral arteries, paraspinal tissues: Negative. Disc levels: C1-2: Unremarkable. C2-3: Normal disc space and facet joints. There is no spinal canal stenosis. No neural foraminal stenosis. C3-4: Normal disc space and facet joints. There is no spinal canal stenosis. No neural foraminal stenosis. C4-5: Normal disc space and facet joints. There is no spinal canal stenosis. No neural foraminal stenosis. C5-6: Small disc bulge uncovertebral spurring. There is no spinal canal stenosis. Mild right and moderate left neural foraminal stenosis. C6-7: Disc space narrowing with endplate spurring. There is no spinal canal stenosis. Mild bilateral neural foraminal stenosis. C7-T1: Normal disc space and facet joints. There is no spinal canal stenosis. No neural foraminal stenosis. IMPRESSION: 1. Motion degraded study. 2. No acute abnormality of the cervical spine. 3. Mild right and moderate left neural foraminal stenosis at C5-6. 4. Mild bilateral C6-7 neural foraminal stenosis. Electronically Signed   By: Ulyses Jarred M.D.   On: 06/26/2021 23:23   MR SHOULDER LEFT WO CONTRAST  Result Date: 06/27/2021 CLINICAL DATA:  Septic arthritis suspected, shoulder, xray done EXAM: MRI OF THE LEFT SHOULDER WITHOUT CONTRAST TECHNIQUE: Multiplanar, multisequence MR  imaging of the shoulder was performed. No intravenous contrast was administered. COMPARISON:  MRI 05/23/2021, x-ray 06/12/2021 FINDINGS: Technical note: Despite efforts by the technologist and patient, motion artifact is present on today's exam and could not be eliminated. This reduces exam sensitivity and specificity. Rotator cuff: Mild tendinosis of the distal supraspinatus tendon anteriorly. Remaining rotator cuff tendons are grossly intact. Muscles:  Mild supraspinatus intramuscular edema. Biceps long head: Grossly intact. Trace tenosynovial fluid, similar to prior. Acromioclavicular Joint: No significant arthropathy of the AC joint. No AC joint effusion. Trace subacromial-subdeltoid bursal fluid. Glenohumeral Joint: Mild chondral thinning.  No joint effusion. Labrum: Grossly intact, but evaluation is limited by motion artifact and lack of intraarticular fluid. Bones: No acute fracture or dislocation. No erosion. No bone marrow edema. No suspicious bone lesion. Other: No soft tissue edema or fluid collection. No visible lymphadenopathy. IMPRESSION: 1. No evidence of septic arthritis or osteomyelitis involving the left shoulder. 2. Mild tendinosis of the distal supraspinatus tendon anteriorly. Negative for rotator cuff tear. 3. Mild supraspinatus intramuscular edema which could reflect a muscle strain. Early changes of myositis could have a similar appearance. Electronically Signed   By: Davina Poke D.O.   On: 06/27/2021 08:09    Scheduled Meds:  (feeding supplement) PROSource Plus  30 mL Oral BID BM   acidophilus  1 capsule Oral Daily   amLODipine  10 mg Oral Daily   budesonide  9 mg Oral Daily   buPROPion ER  200 mg Oral Daily   calcium carbonate  500 mg Oral Daily   carvedilol  25 mg Oral BID WC   Chlorhexidine Gluconate Cloth  6 each Topical Daily   cholecalciferol  1,000 Units Oral Daily   ciprofloxacin  500 mg Oral BID   cycloSPORINE  1 drop Both Eyes BID   DULoxetine  120 mg Oral Daily    enoxaparin (LOVENOX) injection  40 mg Subcutaneous Q24H  estradiol  2 mg Oral Daily   feeding supplement  1 Container Oral Q24H   feeding supplement  237 mL Oral Q24H   hydrALAZINE  100 mg Oral TID   levETIRAcetam  500 mg Oral BID   lidocaine  1 patch Transdermal Q24H   lipase/protease/amylase  36,000 Units Oral TID WC   methadone  5 mg Oral 5 X Daily   methocarbamol  500 mg Oral BID   multivitamin with minerals   Oral Daily   pantoprazole  40 mg Oral Daily   polyvinyl alcohol  1 drop Both Eyes QID   potassium chloride  40 mEq Oral BID   sodium chloride flush  10-40 mL Intracatheter Q12H   sucralfate  1 g Oral TID WC & HS   Teduglutide (rDNA)  1.5 mg Subcutaneous Daily   vitamin B-12  1,000 mcg Oral Daily   Continuous Infusions:  sodium chloride     sodium chloride 60 mL/hr at 06/26/21 1942    LOS: 7 days   Kerney Elbe, DO [facility] PAGER is on AMION  If 7PM-7AM, please contact night-coverage [email or web address]

## 2021-06-28 DIAGNOSIS — E876 Hypokalemia: Secondary | ICD-10-CM

## 2021-06-28 DIAGNOSIS — A4159 Other Gram-negative sepsis: Secondary | ICD-10-CM | POA: Diagnosis not present

## 2021-06-28 DIAGNOSIS — R945 Abnormal results of liver function studies: Secondary | ICD-10-CM | POA: Diagnosis not present

## 2021-06-28 DIAGNOSIS — K50819 Crohn's disease of both small and large intestine with unspecified complications: Secondary | ICD-10-CM | POA: Diagnosis not present

## 2021-06-28 DIAGNOSIS — G894 Chronic pain syndrome: Secondary | ICD-10-CM | POA: Diagnosis not present

## 2021-06-28 DIAGNOSIS — R7401 Elevation of levels of liver transaminase levels: Secondary | ICD-10-CM | POA: Diagnosis not present

## 2021-06-28 DIAGNOSIS — K219 Gastro-esophageal reflux disease without esophagitis: Secondary | ICD-10-CM | POA: Diagnosis not present

## 2021-06-28 DIAGNOSIS — I10 Essential (primary) hypertension: Secondary | ICD-10-CM

## 2021-06-28 LAB — CBC WITH DIFFERENTIAL/PLATELET
Abs Immature Granulocytes: 0.01 10*3/uL (ref 0.00–0.07)
Basophils Absolute: 0 10*3/uL (ref 0.0–0.1)
Basophils Relative: 0 %
Eosinophils Absolute: 0 10*3/uL (ref 0.0–0.5)
Eosinophils Relative: 1 %
HCT: 24.7 % — ABNORMAL LOW (ref 36.0–46.0)
Hemoglobin: 8.3 g/dL — ABNORMAL LOW (ref 12.0–15.0)
Immature Granulocytes: 0 %
Lymphocytes Relative: 48 %
Lymphs Abs: 1.6 10*3/uL (ref 0.7–4.0)
MCH: 30.5 pg (ref 26.0–34.0)
MCHC: 33.6 g/dL (ref 30.0–36.0)
MCV: 90.8 fL (ref 80.0–100.0)
Monocytes Absolute: 0.2 10*3/uL (ref 0.1–1.0)
Monocytes Relative: 7 %
Neutro Abs: 1.5 10*3/uL — ABNORMAL LOW (ref 1.7–7.7)
Neutrophils Relative %: 44 %
Platelets: 158 10*3/uL (ref 150–400)
RBC: 2.72 MIL/uL — ABNORMAL LOW (ref 3.87–5.11)
RDW: 14 % (ref 11.5–15.5)
WBC: 3.3 10*3/uL — ABNORMAL LOW (ref 4.0–10.5)
nRBC: 0 % (ref 0.0–0.2)

## 2021-06-28 LAB — COMPREHENSIVE METABOLIC PANEL
ALT: 31 U/L (ref 0–44)
AST: 12 U/L — ABNORMAL LOW (ref 15–41)
Albumin: 2.8 g/dL — ABNORMAL LOW (ref 3.5–5.0)
Alkaline Phosphatase: 131 U/L — ABNORMAL HIGH (ref 38–126)
Anion gap: 6 (ref 5–15)
BUN: 13 mg/dL (ref 6–20)
CO2: 27 mmol/L (ref 22–32)
Calcium: 9.4 mg/dL (ref 8.9–10.3)
Chloride: 105 mmol/L (ref 98–111)
Creatinine, Ser: 1.29 mg/dL — ABNORMAL HIGH (ref 0.44–1.00)
GFR, Estimated: 48 mL/min — ABNORMAL LOW (ref >60)
Glucose, Bld: 96 mg/dL (ref 70–99)
Potassium: 3.3 mmol/L — ABNORMAL LOW (ref 3.5–5.1)
Sodium: 138 mmol/L (ref 135–145)
Total Bilirubin: 0.7 mg/dL (ref 0.3–1.2)
Total Protein: 6 g/dL — ABNORMAL LOW (ref 6.5–8.1)

## 2021-06-28 LAB — PREALBUMIN: Prealbumin: 24.2 mg/dL (ref 18–38)

## 2021-06-28 LAB — TRIGLYCERIDES: Triglycerides: 85 mg/dL (ref <150)

## 2021-06-28 LAB — MAGNESIUM: Magnesium: 2 mg/dL (ref 1.7–2.4)

## 2021-06-28 LAB — GLUCOSE, CAPILLARY
Glucose-Capillary: 127 mg/dL — ABNORMAL HIGH (ref 70–99)
Glucose-Capillary: 135 mg/dL — ABNORMAL HIGH (ref 70–99)

## 2021-06-28 LAB — PHOSPHORUS: Phosphorus: 2.9 mg/dL (ref 2.5–4.6)

## 2021-06-28 MED ORDER — FENTANYL CITRATE (PF) 100 MCG/2ML IJ SOLN
INTRAMUSCULAR | Status: AC
  Filled 2021-06-28: qty 2

## 2021-06-28 MED ORDER — POTASSIUM CHLORIDE 10 MEQ/100ML IV SOLN
10.0000 meq | INTRAVENOUS | Status: AC
  Administered 2021-06-28 (×4): 10 meq via INTRAVENOUS
  Filled 2021-06-28 (×4): qty 100

## 2021-06-28 MED ORDER — INSULIN ASPART 100 UNIT/ML IJ SOLN
0.0000 [IU] | Freq: Four times a day (QID) | INTRAMUSCULAR | Status: DC
  Administered 2021-06-28 – 2021-06-29 (×2): 1 [IU] via SUBCUTANEOUS

## 2021-06-28 MED ORDER — SODIUM CHLORIDE 0.9 % IV SOLN: INTRAVENOUS | Status: DC

## 2021-06-28 MED ORDER — ACETAMINOPHEN 500 MG PO TABS
1000.0000 mg | ORAL_TABLET | Freq: Three times a day (TID) | ORAL | Status: DC
  Administered 2021-06-28 – 2021-06-30 (×8): 1000 mg via ORAL
  Filled 2021-06-28 (×8): qty 2

## 2021-06-28 MED ORDER — TRACE MINERALS CU-MN-SE-ZN 300-55-60-3000 MCG/ML IV SOLN
INTRAVENOUS | Status: AC
  Filled 2021-06-28: qty 345.6

## 2021-06-28 NOTE — Progress Notes (Addendum)
Palliative:  I had a chance to speak with pain provider Dr. Royce Macadamia. Discussed with him Ms. Rennie's situation and her report that po dilaudid does not provide her any relief as it does not absorb. I discussed with him the possibility of trying liquid SL oxycodone for opioid rotation and to provide better absorption. Dr. Royce Macadamia is okay with trial of oxycodone if Ms. Kynlie wishes too but is more inclined to continue her po dilaudid. He recommends to continue IV dilaudid for improved relief during hospital stay and to continue her previous home doses of methadone and po dilaudid at time of discharge. No changes made to pain medication at this time. Palliative will sign off. No further palliative needs.   No charge  Vinie Sill, NP Palliative Medicine Team Pager [phone] (Please see [email or web address] for schedule) Team Phone [phone]

## 2021-06-28 NOTE — Progress Notes (Addendum)
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  Assessment: 60 year old female on chronic TPN followed by [facility] for short gut syndrome admitted for sepsis from unknown source (possible bacteremia).  Patient is on cyclic TPN over 12 hours at home.  Of note, previous admission in June 2022- TPN was held for a short period of time due to elevated liver enzymes.  TPN started on 7/27 and d/ced on 7/28 d/t bacteremia.Patient is now being treated with abx for acinetobacter bacteremia secondary to line infection.  ID team is ok with resuming TPN back on 06/28/21.  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 CorrCa 10.36; other lytes wnl Renal: Scr 1.29 (crcl~41) Hepatic: AST low, Tbili wnl; alk phos trending down 131 -  prealbumin 22.9 (7/27), 24.2 (8/2) - TG 91 (7/27) Intake / Output; MIVF: NS at 60 ml/hr - I/O: +2560 mL GI Imaging: - 7/25 abd CT: Avascular necrosis of the left femoral head without acute fracture or cortical collapse GI Surgeries / Procedures: n/a Ordered GI Medications: Creon 36,000 units TID w/ meals, Carafate 1g TID w/ meals and QHS, PPI, pepcid prn  Central access: PICC line placed 04/03/21 TPN start date: on TPN PTA  Nutritional Goals (Dietician's recom on 8/2): Kcal:  1800-2000 kcal Protein:  90-100 grams Fluid:  >/= 2.2 L/day  Goal TPN rate is 100 mL/hr (provides 86 g of protein and 1852 kcals per day) - patient will attain goal protein amount with protein content from boost/ensure  PTA TPN Rx (see media tab for Rx image): total 1088 kCal (w/o  Lipids) and 1688 kcal w/ lipids; 340 kcal AA, 748 kcal CHO -Formula w/o lipids 4 days/week- receives SMOFlipid 60g on MWF. Adds Infuvite 10 mL and Tralement 36m to each bag daily.  -Dextrose 70%= 220g/day; Plenamine 15% = 85g/day; Kphos 24 mmol/day; K acetate 57 mEq/day; Mg 10 mEq/day; CaGlu 10 mEq/day; Na Acetate 76 mEq/day - Na = 83 mEq/L  - K = 51 mEq/L - Ca= 5.5 mEq/L - Mag = 5.5 mEq/L - Phos = 13 mmol/L - Cl:Ac = 1:3  ++ Will dose inpatient TPN using dietician's recom. for nutritional goals ++  Current Nutrition:  - Prosource plus bid - 8/2: boost 1 container daily  and ensure 237 ml/ daily ordered - on regular diet   Plan:   Now: - potassium chloride 10 meq IV x 4 runs  At 1800:  - Start TPN at 40 mL/hr at 1800 - Electrolytes in TPN: Na 850m/L, K 5044mL, Ca 5mE71m, Mg 5mEq55m and Phos 15mmo1m Cl:Ac--> 1:1 - Add standard MVI and trace elements to TPN  - Initiate Sensitive q6h SSI and adjust as needed  - Reduce MIVF to 20 mL/hr at 1800 - Monitor TPN labs on Mon/Thurs  Jennipher Weatherholtz PhDia SittermD, BCPS 06/28/2021 7:31 AM

## 2021-06-28 NOTE — Progress Notes (Signed)
PROGRESS NOTE    Caitlin Peters  IRC:[id number] DOB: 1961-05-09 DOA: 06/19/2021 PCP: [name], [name], MD    Chief Complaint  Patient presents with   Weakness    Fever per family    Brief Narrative: The patient is a 60 year old African-American female with a past medical history significant for but not limited to short gut syndrome requiring TPN, history of Crohn's disease with multiple surgeries, chronic pain syndrome, history of chronic PICC line for IV access as well as other comorbidities including chronic pancreatitis who presented to the ED on 06/19/2021 with fever and chills.  She reports that she has not felt well since 05/29/2021 when she was hospitalized late in June 2022.  Since leaving the hospital she has noticed pain in her left shoulder and arm.  She states that fever and chills began the day before admission and has been intermittent.  She reported diaphoresis at night and her fever at home was as high as 102.  She also admits that chronic abdominal pain located upper abdomen on the left side that radiates to the back that is 7 out of 10 and characterized as cramping and sharp as well as dull at times.  She states that her abdominal pain is alleviated by nothing and exacerbated by eating.  She had fevers and chills that were alleviated by nothing and exacerbated by nothing.  Associated symptoms include nausea and vomiting intermittently.  She states that she has chronic diarrhea more like Jell-O consistency but did not notice any blood and did not have any hematuria or dysuria.  She presented the ED and she is found to be febrile at 108 with a pulse rate of 104.  She was leukopenic and did have abnormal LFTs which are now improving.  Urinalysis showed no leukocytes or nitrites and cultures were drawn however.  She was placed on IV antibiotics with IV vancomycin and IV ceftriaxone and now changed to IV Cefepemime by ID given her GNR Bacteremia. Cx's are growing Acinetobacter  Aldean Ast is responding to cefepime.  ID recommending continue IV cefepime while hospitalized and possibly changing to p.o. ciprofloxacin at discharge.  They are recommending continuing her central access in place given her difficult access.   Pain was relatively uncontrolled so we will consult palliative care for further assistance with pain management.  She had low-grade temperatures overnight so ID repeating blood cultures and changed to p.o. ciprofloxacin.  ID recommending continuing for total 2 weeks in the setting of her retained femoral line.  Given her increased shoulder pain despite pain regimen we will repeat an MRI and ID is in agreement.  If she does not continue to improve we will obtain an orthopedic evaluation.  We will also scan the patient's neck given that she may have some radicular pain and ID is recommending checking her ESR and CRP.   MRI Neck done and showed "Motion degraded study. No acute abnormality of the cervical spine. Mild right and moderate left neural foraminal stenosis at C5-6. Mild bilateral C6-7 neural foraminal stenosis." MRI Left Shoulder done and showed ". No evidence of septic arthritis or osteomyelitis involving the left shoulder. Mild tendinosis of the distal supraspinatus tendon anteriorly. Negative for rotator cuff tear. Mild  supraspinatus intramuscular edema which could reflect a muscle strain. Early changes of myositis could have a similar appearance."   Pain is still relatively uncontrolled so now trial Oxy IR in addition to her other regimen as we can still not get in touch with  her regular pain physician.  Now ID is changed her recommendation to stop on 07/09/2021 with p.o. ciprofloxacin and they have signed off the case given that her inflammatory markers are downtrending.       Assessment & Plan:   Principal Problem:   SIRS (systemic inflammatory response syndrome) ([name]) Active Problems:   Crohn's disease of small & large intestine with SGS   Short  gut syndrome   GERD (gastroesophageal reflux disease)   Chronic pain syndrome   On total parenteral nutrition (TPN)   LFTs abnormal   Pancytopenia ([name])  #1 sepsis secondary to actinobacter urdingii bacteremia, POA -Patient presented with symptoms concerning for sepsis. -Patient pancultured with blood cultures positive for actinobacter bacteremia. -Patient with no history of PICC line infections. -Patient initially empirically on broad-spectrum IV antibiotics of vancomycin, cefepime, Flagyl, Rocephin. -Patient seen in consultation by ID and antibiotics have been transitioned to oral ciprofloxacin with ID recommending continuation at discharge through 07/09/2021. -Patient noted to have had low-grade temperature of 100.2 and no surgeries.  Blood cultures ordered which are pending. -Per ID no indication for line exchange at this time and recommending continuation of ciprofloxacin. -ID was following but have signed off. -Supportive care.  2.  Pancytopenia -Likely secondary to sepsis. -Counts currently stable. -Continue antibiotics. -Follow.  3.  Short gut syndrome -On chronic TPN. -TPN initially held given bacteremia however cleared by ID to resume and as such TPN has been resumed.  4.  Abnormal LFTs/transaminitis -In the setting of TPN and bacteremia. -LFTs trended down and have improved. -Outpatient follow-up.  5.  GERD -Continue PPI, Pepcid, sucralfate.  6.  Hypokalemia/hypophosphatemia/hypomagnesemia -Potassium at 3.3, magnesium at 2.0, phosphorus at 2.9. -Replace potassium. -Repeat labs in the morning.  7.  Chronic pain syndrome -Patient noted to have increased requirements of IV Dilaudid for her pain. -Continue methadone 5 mg p.o. 5 times daily, oral hydromorphone as needed for breakthrough pain, IV hydromorphone as needed severe pain. -Continue Robaxin. -Cymbalta dose increased 220 mg daily per palliative care recommendations -Place on scheduled Tylenol. -Continue  lidocaine patch. -Palliative care consulted and following and getting in touch with patient's pain specialist in outpatient setting.  8.  Crohn's disease of the small and large intestine with short gut syndrome -Continue home regimen TPN, budesonide, teduglutide. -Outpatient follow-up with primary gastroenterologist at [facility].  9.  Left shoulder pain -Patient with complaints of left shoulder pain during this hospitalization had an MRI done in the outpatient setting which did not show any acute findings and being followed by PCP. -Patient noted to be referred to orthopedic surgery with an appointment for August 1. -Due to ongoing complaints of pain and bacteremia MRI of left shoulder was done as well as MRI of the C-spine which were negative for osteomyelitis however did show some tendinosis of the distal supraspinatus tendon anteriorly, negative for rotator cuff tear, mild supraspinatus intramuscular edema which could reflect a muscle strain. -Continue current pain regimen. -Outpatient follow-up with orthopedics.  10.  Acute kidney injury on chronic kidney disease stage IIIa/metabolic acidosis -Improved -Close to baseline.  11.  Depression/anxiety -Continue Wellbutrin. -Cymbalta dose increased 220 mg daily per palliative care recommendations.  12.  Hypertension -Continue Norvasc, Coreg.  13.  History of recurrent pancreatitis -Stable. -Continue Creon.      DVT prophylaxis: Lovenox Code Status: Full Family Communication: Updated patient.  No family at bedside. Disposition:   Status is: Inpatient  Remains inpatient appropriate because:Inpatient level of care appropriate due to severity of illness  Dispo:  Patient From: Home  Planned Disposition: Home with Health Care Svc  Medically stable for discharge: No         Consultants:  ID:Dr. Linus Salmons 06/22/2021 Palliative care: Vinie Sill, NP 01/29/6860  Procedures:  CT abdomen and pelvis 06/19/2021 Chest x-ray 06/19/2021 MRI  left shoulder 06/26/2021 MRI C-spine 06/26/2021  Antimicrobials: IV cefepime 06/22/2021>>>> 06/25/2021 IV Rocephin 06/19/2021>>>> 06/22/2021 IV Flagyl 06/20/2021>>>> 06/22/2021 IV vancomycin 06/19/2021>>>> 06/22/2021 Oral ciprofloxacin 06/25/2021>>>>> 07/09/2021   Subjective: Sitting up in bedside. States neck pain improving, c/o occasion RUE shooting pain.  No significant chest pain.  No shortness of breath.  Patient still requiring high levels of IV Dilaudid for pain management  Objective: Vitals:   06/28/21 0430 06/28/21 0431 06/28/21 0458 06/28/21 0607  BP:  (!) 189/87  (!) 181/85  Pulse:  67    Resp:      Temp:  98.6 F (37 C)    TempSrc:  Oral    SpO2:  99%    Weight: 57 kg  56.3 kg   Height:        Intake/Output Summary (Last 24 hours) at 06/28/2021 1224 Last data filed at 06/28/2021 0600 Gross per 24 hour  Intake 1492.96 ml  Output --  Net 1492.96 ml   Filed Weights   06/27/21 0613 06/28/21 0430 06/28/21 0458  Weight: 57.6 kg 57 kg 56.3 kg    Examination:  General exam: Appears calm and comfortable  Respiratory system: Clear to auscultation. Respiratory effort normal. Cardiovascular system: S1 & S2 heard, RRR. No JVD, murmurs, rubs, gallops or clicks. No pedal edema. Gastrointestinal system: Abdomen is nondistended, soft and nontender. No organomegaly or masses felt. Normal bowel sounds heard. Central nervous system: Alert and oriented. No focal neurological deficits. Extremities: Symmetric 5 x 5 power. Skin: No rashes, lesions or ulcers Psychiatry: Judgement and insight appear normal. Mood & affect appropriate.     Data Reviewed: I have personally reviewed following labs and imaging studies  CBC: Recent Labs  Lab 06/24/21 0402 06/25/21 0325 06/26/21 0410 06/27/21 0340 06/28/21 0422  WBC 3.0* 3.3* 4.9 4.3 3.3*  NEUTROABS 1.3* 1.5* 2.8 2.1 1.5*  HGB 7.8* 7.6* 8.5* 8.0* 8.3*  HCT 24.0* 23.6* 25.9* 24.3* 24.7*  MCV 95.6 93.7 91.5 90.3 90.8  PLT 154 168 205 185  683    Basic Metabolic Panel: Recent Labs  Lab 06/24/21 0402 06/25/21 0325 06/26/21 0410 06/27/21 0340 06/28/21 0422  NA 139 140 135 139 138  K 3.4* 3.2* 3.6 2.9* 3.3*  CL 112* 112* 107 104 105  CO2 _0 GLUCOSE 97 81 107* 90 96  BUN _1 CREATININE 1.43* 1.40* 1.26* 1.21* 1.29*  CALCIUM 8.6* 9.1 9.5 9.1 9.4  MG 1.7 2.1 1.7 1.6* 2.0  PHOS 2.9 2.6 2.0* 3.5 2.9    GFR: Estimated Creatinine Clearance: 41.2 mL/min (A) (by C-G formula based on SCr of 1.29 mg/dL (H)).  Liver Function Tests: Recent Labs  Lab 06/24/21 0402 06/25/21 0325 06/26/21 0410 06/27/21 0340 06/28/21 0422  AST 17 14* 15 14* 12*  ALT 73* 57* 54* 41 31  ALKPHOS 153* 132* 158* 143* 131*  BILITOT 0.5 0.6 0.7 0.9 0.7  PROT 5.7* 5.7* 6.8 6.2* 6.0*  ALBUMIN 2.5* 2.6* 3.0* 2.8* 2.8*    CBG: Recent Labs  Lab 06/24/21 1129 06/24/21 1644 06/24/21 2359 06/25/21 0528 06/25/21 1130  GLUCAP 92 108* 124* 107* 94     Recent Results (from the  past 240 hour(s))  Resp Panel by RT-PCR (Flu A&B, Covid) Nasopharyngeal Swab     Status: None   Collection Time: 06/19/21  9:20 PM   Specimen: Nasopharyngeal Swab; Nasopharyngeal(NP) swabs in vial transport medium  Result Value Ref Range Status   SARS Coronavirus 2 by RT PCR NEGATIVE NEGATIVE Final    Comment: (NOTE) SARS-CoV-2 target nucleic acids are NOT DETECTED.  The SARS-CoV-2 RNA is generally detectable in upper respiratory specimens during the acute phase of infection. The lowest concentration of SARS-CoV-2 viral copies this assay can detect is 138 copies/mL. A negative result does not preclude SARS-Cov-2 infection and should not be used as the sole basis for treatment or other patient management decisions. A negative result may occur with  improper specimen collection/handling, submission of specimen other than nasopharyngeal swab, presence of viral mutation(s) within the areas targeted by this assay, and inadequate number of  viral copies(<138 copies/mL). A negative result must be combined with clinical observations, patient history, and epidemiological information. The expected result is Negative.  Fact Sheet for Patients:  EntrepreneurPulse.com.au  Fact Sheet for Healthcare Providers:  IncredibleEmployment.be  This test is no t yet approved or cleared by the [place] FDA and  has been authorized for detection and/or diagnosis of SARS-CoV-2 by FDA under an Emergency Use Authorization (EUA). This EUA will remain  in effect (meaning this test can be used) for the duration of the COVID-19 declaration under Section 564(b)(1) of the Act, 21 U.S.C.section 360bbb-3(b)(1), unless the authorization is terminated  or revoked sooner.       Influenza A by PCR NEGATIVE NEGATIVE Final   Influenza B by PCR NEGATIVE NEGATIVE Final    Comment: (NOTE) The Xpert Xpress SARS-CoV-2/FLU/RSV plus assay is intended as an aid in the diagnosis of influenza from Nasopharyngeal swab specimens and should not be used as a sole basis for treatment. Nasal washings and aspirates are unacceptable for Xpert Xpress SARS-CoV-2/FLU/RSV testing.  Fact Sheet for Patients: EntrepreneurPulse.com.au  Fact Sheet for Healthcare Providers: IncredibleEmployment.be  This test is not yet approved or cleared by the [place] FDA and has been authorized for detection and/or diagnosis of SARS-CoV-2 by FDA under an Emergency Use Authorization (EUA). This EUA will remain in effect (meaning this test can be used) for the duration of the COVID-19 declaration under Section 564(b)(1) of the Act, 21 U.S.C. section 360bbb-3(b)(1), unless the authorization is terminated or revoked.  Performed at [facility], [place] [address] [place], [place] [address]   Urine Culture     Status: Abnormal   Collection Time: 06/19/21  9:45 PM   Specimen: In/Out Cath  Urine  Result Value Ref Range Status   Specimen Description   Final    IN/OUT CATH URINE Performed at [place] [address], [place], [place] [address]    Special Requests   Final    NONE Performed at [facility], [place] [address] [place], [place] [address]    Culture (A)  Final    4,000 COLONIES/mL STAPHYLOCOCCUS EPIDERMIDIS 10,000 COLONIES/mL LACTOBACILLUS SPECIES Standardized susceptibility testing for this organism is not available. Performed at [facility], [place] [address], [place], [place] [address]    Report Status 06/21/2021 FINAL  Final  Blood Culture (routine x 2)     Status: Abnormal   Collection Time: 06/19/21  9:45 PM   Specimen: BLOOD  Result Value Ref Range Status   Specimen Description   Final    BLOOD  RIGHT ANTECUBITAL Performed at [place] [address] [place], [place] [address]    Special Requests   Final    BOTTLES DRAWN AEROBIC AND ANAEROBIC Blood Culture adequate volume Performed at [facility] [address], [place], [place] [address]    Culture  Setup Time   Final    GRAM NEGATIVE RODS AEROBIC BOTTLE ONLY CRITICAL RESULT CALLED TO, READ BACK BY AND VERIFIED WITH: PHARMD M TUCKER [id number] FCP CRITICAL RESULT CALLED TO, READ BACK BY AND VERIFIED WITH: pharmd Dillard [place] 06/22/21_0 :25 by tw    Culture (A)  Final    ACINETOBACTER URSINGII CRITICAL RESULT CALLED TO, READ BACK BY AND VERIFIED WITH: Trude Mcburney [id number] FCP Performed at [facility], [place] [address], [place], [place] [address]    Report Status 06/25/2021 FINAL  Final   Organism ID, Bacteria ACINETOBACTER URSINGII  Final      Susceptibility   Acinetobacter ursingii - MIC*    CEFTAZIDIME 8 SENSITIVE Sensitive     CIPROFLOXACIN 0.5 SENSITIVE Sensitive     GENTAMICIN <=1 SENSITIVE Sensitive     IMIPENEM <=0.25 SENSITIVE Sensitive     TRIMETH/SULFA 80 RESISTANT Resistant      AMPICILLIN/SULBACTAM <=2 SENSITIVE Sensitive     * ACINETOBACTER URSINGII  Blood Culture ID Panel (Reflexed)     Status: None   Collection Time: 06/19/21  9:45 PM  Result Value Ref Range Status   Enterococcus faecalis NOT DETECTED NOT DETECTED Final   Enterococcus Faecium NOT DETECTED NOT DETECTED Final   Listeria monocytogenes NOT DETECTED NOT DETECTED Final   Staphylococcus species NOT DETECTED NOT DETECTED Final   Staphylococcus aureus (BCID) NOT DETECTED NOT DETECTED Final   Staphylococcus epidermidis NOT DETECTED NOT DETECTED Final   Staphylococcus lugdunensis NOT DETECTED NOT DETECTED Final   Streptococcus species NOT DETECTED NOT DETECTED Final   Streptococcus agalactiae NOT DETECTED NOT DETECTED Final   Streptococcus pneumoniae NOT DETECTED NOT DETECTED Final   Streptococcus pyogenes NOT DETECTED NOT DETECTED Final   A.calcoaceticus-baumannii NOT DETECTED NOT DETECTED Final   Bacteroides fragilis NOT DETECTED NOT DETECTED Final   Enterobacterales NOT DETECTED NOT DETECTED Final   Enterobacter cloacae complex NOT DETECTED NOT DETECTED Final   Escherichia coli NOT DETECTED NOT DETECTED Final   Klebsiella aerogenes NOT DETECTED NOT DETECTED Final   Klebsiella oxytoca NOT DETECTED NOT DETECTED Final   Klebsiella pneumoniae NOT DETECTED NOT DETECTED Final   Proteus species NOT DETECTED NOT DETECTED Final   Salmonella species NOT DETECTED NOT DETECTED Final   Serratia marcescens NOT DETECTED NOT DETECTED Final   Haemophilus influenzae NOT DETECTED NOT DETECTED Final   Neisseria meningitidis NOT DETECTED NOT DETECTED Final   Pseudomonas aeruginosa NOT DETECTED NOT DETECTED Final   Stenotrophomonas maltophilia NOT DETECTED NOT DETECTED Final   Candida albicans NOT DETECTED NOT DETECTED Final   Candida auris NOT DETECTED NOT DETECTED Final   Candida glabrata NOT DETECTED NOT DETECTED Final   Candida krusei NOT DETECTED NOT DETECTED Final   Candida parapsilosis NOT DETECTED NOT  DETECTED Final   Candida tropicalis NOT DETECTED NOT DETECTED Final   Cryptococcus neoformans/gattii NOT DETECTED NOT DETECTED Final    Comment: Performed at [facility], [place] [address] [place], [place] [address]  MRSA Next Gen by PCR, Nasal     Status: None   Collection Time: 06/20/21  6:27 PM   Specimen: Nasal Mucosa; Nasal Swab  Result Value Ref Range Status  MRSA by PCR Next Gen NOT DETECTED NOT DETECTED Final    Comment: (NOTE) The GeneXpert MRSA Assay (FDA approved for NASAL specimens only), is one component of a comprehensive MRSA colonization surveillance program. It is not intended to diagnose MRSA infection nor to guide or monitor treatment for MRSA infections. Test performance is not FDA approved in patients less than 28 years old. Performed at [facility], [place] [address] [place], [place] [address]   Culture, blood (routine x 2)     Status: None (Preliminary result)   Collection Time: 06/25/21 12:49 PM   Specimen: BLOOD  Result Value Ref Range Status   Specimen Description   Final    BLOOD RIGHT ANTECUBITAL Performed at [facility] [address], [place], [place] [address]    Special Requests   Final    BOTTLES DRAWN AEROBIC ONLY Blood Culture results may not be optimal due to an inadequate volume of blood received in culture bottles Performed at [facility] [address], [place], [place] [address]    Culture   Final    NO GROWTH 3 DAYS Performed at [facility], [place] [address], [place], [place] [address]    Report Status PENDING  Incomplete  Culture, blood (routine x 2)     Status: None (Preliminary result)   Collection Time: 06/25/21  1:38 PM   Specimen: BLOOD  Result Value Ref Range Status   Specimen Description   Final    BLOOD BLOOD RIGHT HAND Performed at [facility] [address] [place], [place] [address]    Special Requests   Final    BOTTLES DRAWN  AEROBIC ONLY Blood Culture results may not be optimal due to an inadequate volume of blood received in culture bottles Performed at [facility] [address], [place], [place] [address]    Culture   Final    NO GROWTH 3 DAYS Performed at [facility], [place] [address], [place], [place] [address]    Report Status PENDING  Incomplete         Radiology Studies: MR CERVICAL SPINE WO CONTRAST  Result Date: 06/26/2021 CLINICAL DATA:  Cervical radiculopathy and shoulder pain with infection suspected. EXAM: MRI CERVICAL SPINE WITHOUT CONTRAST TECHNIQUE: Multiplanar, multisequence MR imaging of the cervical spine was performed. No intravenous contrast was administered. COMPARISON:  06/02/2021 FINDINGS: Motion degraded study. Alignment: Physiologic. Vertebrae: No fracture, evidence of discitis, or bone lesion. Cord: Normal signal and morphology. Posterior Fossa, vertebral arteries, paraspinal tissues: Negative. Disc levels: C1-2: Unremarkable. C2-3: Normal disc space and facet joints. There is no spinal canal stenosis. No neural foraminal stenosis. C3-4: Normal disc space and facet joints. There is no spinal canal stenosis. No neural foraminal stenosis. C4-5: Normal disc space and facet joints. There is no spinal canal stenosis. No neural foraminal stenosis. C5-6: Small disc bulge uncovertebral spurring. There is no spinal canal stenosis. Mild right and moderate left neural foraminal stenosis. C6-7: Disc space narrowing with endplate spurring. There is no spinal canal stenosis. Mild bilateral neural foraminal stenosis. C7-T1: Normal disc space and facet joints. There is no spinal canal stenosis. No neural foraminal stenosis. IMPRESSION: 1. Motion degraded study. 2. No acute abnormality of the cervical spine. 3. Mild right and moderate left neural foraminal stenosis at C5-6. 4. Mild bilateral C6-7 neural foraminal stenosis. Electronically Signed   By: Ulyses Jarred M.D.   On:  06/26/2021 23:23   MR SHOULDER  LEFT WO CONTRAST  Result Date: 06/27/2021 CLINICAL DATA:  Septic arthritis suspected, shoulder, xray done EXAM: MRI OF THE LEFT SHOULDER WITHOUT CONTRAST TECHNIQUE: Multiplanar, multisequence MR imaging of the shoulder was performed. No intravenous contrast was administered. COMPARISON:  MRI 05/23/2021, x-ray 06/12/2021 FINDINGS: Technical note: Despite efforts by the technologist and patient, motion artifact is present on today's exam and could not be eliminated. This reduces exam sensitivity and specificity. Rotator cuff: Mild tendinosis of the distal supraspinatus tendon anteriorly. Remaining rotator cuff tendons are grossly intact. Muscles:  Mild supraspinatus intramuscular edema. Biceps long head: Grossly intact. Trace tenosynovial fluid, similar to prior. Acromioclavicular Joint: No significant arthropathy of the AC joint. No AC joint effusion. Trace subacromial-subdeltoid bursal fluid. Glenohumeral Joint: Mild chondral thinning.  No joint effusion. Labrum: Grossly intact, but evaluation is limited by motion artifact and lack of intraarticular fluid. Bones: No acute fracture or dislocation. No erosion. No bone marrow edema. No suspicious bone lesion. Other: No soft tissue edema or fluid collection. No visible lymphadenopathy. IMPRESSION: 1. No evidence of septic arthritis or osteomyelitis involving the left shoulder. 2. Mild tendinosis of the distal supraspinatus tendon anteriorly. Negative for rotator cuff tear. 3. Mild supraspinatus intramuscular edema which could reflect a muscle strain. Early changes of myositis could have a similar appearance. Electronically Signed   By: Davina Poke D.O.   On: 06/27/2021 08:09        Scheduled Meds:  (feeding supplement) PROSource Plus  30 mL Oral BID BM   acetaminophen  1,000 mg Oral TID   acidophilus  1 capsule Oral Daily   amLODipine  10 mg Oral Daily   budesonide  9 mg Oral Daily   buPROPion ER  200 mg Oral Daily    calcium carbonate  500 mg Oral Daily   carvedilol  25 mg Oral BID WC   Chlorhexidine Gluconate Cloth  6 each Topical Daily   cholecalciferol  1,000 Units Oral Daily   ciprofloxacin  500 mg Oral BID   cycloSPORINE  1 drop Both Eyes BID   DULoxetine  120 mg Oral Daily   enoxaparin (LOVENOX) injection  40 mg Subcutaneous Q24H   estradiol  2 mg Oral Daily   feeding supplement  1 Container Oral Q24H   feeding supplement  237 mL Oral Q24H   hydrALAZINE  100 mg Oral TID   insulin aspart  0-9 Units Subcutaneous Q6H   levETIRAcetam  500 mg Oral BID   lidocaine  1 patch Transdermal Q24H   lipase/protease/amylase  36,000 Units Oral TID WC   methadone  5 mg Oral 5 X Daily   methocarbamol  500 mg Oral BID   multivitamin with minerals   Oral Daily   pantoprazole  40 mg Oral Daily   polyvinyl alcohol  1 drop Both Eyes QID   sodium chloride flush  10-40 mL Intracatheter Q12H   sucralfate  1 g Oral TID WC & HS   Teduglutide (rDNA)  1.5 mg Subcutaneous Daily   vitamin B-12  1,000 mcg Oral Daily   Continuous Infusions:  sodium chloride     sodium chloride 60 mL/hr at 06/28/21 9741   sodium chloride     potassium chloride 10 mEq (06/28/21 1146)   TPN ADULT (ION)       LOS: 8 days    Time spent: 40 minutes    Irine Seal, MD [facility]   To contact the attending provider between 7A-7P or the covering provider during after hours 7P-7A, please  log into the web site [email or web address] and access using universal [place] password for that web site. If you do not have the password, please call the hospital operator.  06/28/2021, 12:24 PM

## 2021-06-28 NOTE — Plan of Care (Signed)
  Problem: Clinical Measurements: Goal: Will remain free from infection Outcome: Progressing Goal: Diagnostic test results will improve Outcome: Progressing   Problem: Activity: Goal: Risk for activity intolerance will decrease Outcome: Progressing

## 2021-06-28 NOTE — Plan of Care (Signed)
  Problem: Activity: Goal: Risk for activity intolerance will decrease Outcome: Progressing   Problem: Nutrition: Goal: Adequate nutrition will be maintained Outcome: Progressing   Problem: Education: Goal: Knowledge of General Education information will improve Description: Including pain rating scale, medication(s)/side effects and non-pharmacologic comfort measures Outcome: Completed/Met   

## 2021-06-29 DIAGNOSIS — R7401 Elevation of levels of liver transaminase levels: Secondary | ICD-10-CM | POA: Diagnosis not present

## 2021-06-29 DIAGNOSIS — G894 Chronic pain syndrome: Secondary | ICD-10-CM | POA: Diagnosis not present

## 2021-06-29 DIAGNOSIS — A4159 Other Gram-negative sepsis: Secondary | ICD-10-CM | POA: Diagnosis not present

## 2021-06-29 DIAGNOSIS — K50819 Crohn's disease of both small and large intestine with unspecified complications: Secondary | ICD-10-CM | POA: Diagnosis not present

## 2021-06-29 DIAGNOSIS — K219 Gastro-esophageal reflux disease without esophagitis: Secondary | ICD-10-CM | POA: Diagnosis not present

## 2021-06-29 DIAGNOSIS — R945 Abnormal results of liver function studies: Secondary | ICD-10-CM | POA: Diagnosis not present

## 2021-06-29 LAB — CBC
HCT: 25.8 % — ABNORMAL LOW (ref 36.0–46.0)
Hemoglobin: 8.3 g/dL — ABNORMAL LOW (ref 12.0–15.0)
MCH: 30.1 pg (ref 26.0–34.0)
MCHC: 32.2 g/dL (ref 30.0–36.0)
MCV: 93.5 fL (ref 80.0–100.0)
Platelets: 198 10*3/uL (ref 150–400)
RBC: 2.76 MIL/uL — ABNORMAL LOW (ref 3.87–5.11)
RDW: 14.3 % (ref 11.5–15.5)
WBC: 4 10*3/uL (ref 4.0–10.5)
nRBC: 0 % (ref 0.0–0.2)

## 2021-06-29 LAB — COMPREHENSIVE METABOLIC PANEL
ALT: 26 U/L (ref 0–44)
AST: 12 U/L — ABNORMAL LOW (ref 15–41)
Albumin: 2.6 g/dL — ABNORMAL LOW (ref 3.5–5.0)
Alkaline Phosphatase: 123 U/L (ref 38–126)
Anion gap: 7 (ref 5–15)
BUN: 20 mg/dL (ref 6–20)
CO2: 25 mmol/L (ref 22–32)
Calcium: 9.4 mg/dL (ref 8.9–10.3)
Chloride: 108 mmol/L (ref 98–111)
Creatinine, Ser: 1.68 mg/dL — ABNORMAL HIGH (ref 0.44–1.00)
GFR, Estimated: 35 mL/min — ABNORMAL LOW (ref >60)
Glucose, Bld: 112 mg/dL — ABNORMAL HIGH (ref 70–99)
Potassium: 3.5 mmol/L (ref 3.5–5.1)
Sodium: 140 mmol/L (ref 135–145)
Total Bilirubin: 0.7 mg/dL (ref 0.3–1.2)
Total Protein: 6.1 g/dL — ABNORMAL LOW (ref 6.5–8.1)

## 2021-06-29 LAB — PHOSPHORUS: Phosphorus: 4.2 mg/dL (ref 2.5–4.6)

## 2021-06-29 LAB — MAGNESIUM: Magnesium: 1.9 mg/dL (ref 1.7–2.4)

## 2021-06-29 LAB — GLUCOSE, CAPILLARY
Glucose-Capillary: 100 mg/dL — ABNORMAL HIGH (ref 70–99)
Glucose-Capillary: 106 mg/dL — ABNORMAL HIGH (ref 70–99)
Glucose-Capillary: 120 mg/dL — ABNORMAL HIGH (ref 70–99)

## 2021-06-29 MED ORDER — POTASSIUM CHLORIDE CRYS ER 20 MEQ PO TBCR
20.0000 meq | EXTENDED_RELEASE_TABLET | Freq: Once | ORAL | Status: AC
  Administered 2021-06-29: 20 meq via ORAL
  Filled 2021-06-29: qty 1

## 2021-06-29 MED ORDER — TRAVASOL 10 % IV SOLN
INTRAVENOUS | Status: AC
  Filled 2021-06-29: qty 518.4

## 2021-06-29 NOTE — Progress Notes (Signed)
PT Cancellation Note / Screen  Patient Details Name: Caitlin Peters MRN: [id number] DOB: March 03, 1961   Cancelled Treatment:    Reason Eval/Treat Not Completed: PT screened, no needs identified, will sign off Pt evaluated by PT on 06/23/21 with no further acute needs and recommended f/u with OPPT for left shoulder pain.  Pt up ad lib in room per nursing and has no new needs.  PT to sign off at this time.   Netty Sullivant,KATHrine E 06/29/2021, 11:22 AM Arlyce Dice, DPT Acute Rehabilitation Services Pager: [phone] Office: [phone]

## 2021-06-29 NOTE — Plan of Care (Signed)

## 2021-06-29 NOTE — Progress Notes (Signed)
OT Cancellation Note  Patient Details Name: ALABAMA DOIG MRN: [id number] DOB: 1960-12-22   Cancelled Treatment:    Reason Eval/Treat Not Completed: OT screened, no needs identified, will sign off per nurse no change in patient status at this time.  Jackelyn Poling OTR/L, [place] Acute Rehabilitation Department Office# [phone] Pager# [phone]   [place] 06/29/2021, 11:59 AM

## 2021-06-29 NOTE — Progress Notes (Signed)
PROGRESS NOTE    Caitlin Peters  IRC:[id number] DOB: 1961-05-09 DOA: 06/19/2021 PCP: [name], [name], MD    Chief Complaint  Patient presents with   Weakness    Fever per family    Brief Narrative: The patient is a 60 year old African-American female with a past medical history significant for but not limited to short gut syndrome requiring TPN, history of Crohn's disease with multiple surgeries, chronic pain syndrome, history of chronic PICC line for IV access as well as other comorbidities including chronic pancreatitis who presented to the ED on 06/19/2021 with fever and chills.  She reports that she has not felt well since 05/29/2021 when she was hospitalized late in June 2022.  Since leaving the hospital she has noticed pain in her left shoulder and arm.  She states that fever and chills began the day before admission and has been intermittent.  She reported diaphoresis at night and her fever at home was as high as 102.  She also admits that chronic abdominal pain located upper abdomen on the left side that radiates to the back that is 7 out of 10 and characterized as cramping and sharp as well as dull at times.  She states that her abdominal pain is alleviated by nothing and exacerbated by eating.  She had fevers and chills that were alleviated by nothing and exacerbated by nothing.  Associated symptoms include nausea and vomiting intermittently.  She states that she has chronic diarrhea more like Jell-O consistency but did not notice any blood and did not have any hematuria or dysuria.  She presented the ED and she is found to be febrile at 108 with a pulse rate of 104.  She was leukopenic and did have abnormal LFTs which are now improving.  Urinalysis showed no leukocytes or nitrites and cultures were drawn however.  She was placed on IV antibiotics with IV vancomycin and IV ceftriaxone and now changed to IV Cefepemime by ID given her GNR Bacteremia. Cx's are growing Acinetobacter  Aldean Ast is responding to cefepime.  ID recommending continue IV cefepime while hospitalized and possibly changing to p.o. ciprofloxacin at discharge.  They are recommending continuing her central access in place given her difficult access.   Pain was relatively uncontrolled so we will consult palliative care for further assistance with pain management.  She had low-grade temperatures overnight so ID repeating blood cultures and changed to p.o. ciprofloxacin.  ID recommending continuing for total 2 weeks in the setting of her retained femoral line.  Given her increased shoulder pain despite pain regimen we will repeat an MRI and ID is in agreement.  If she does not continue to improve we will obtain an orthopedic evaluation.  We will also scan the patient's neck given that she may have some radicular pain and ID is recommending checking her ESR and CRP.   MRI Neck done and showed "Motion degraded study. No acute abnormality of the cervical spine. Mild right and moderate left neural foraminal stenosis at C5-6. Mild bilateral C6-7 neural foraminal stenosis." MRI Left Shoulder done and showed ". No evidence of septic arthritis or osteomyelitis involving the left shoulder. Mild tendinosis of the distal supraspinatus tendon anteriorly. Negative for rotator cuff tear. Mild  supraspinatus intramuscular edema which could reflect a muscle strain. Early changes of myositis could have a similar appearance."   Pain is still relatively uncontrolled so now trial Oxy IR in addition to her other regimen as we can still not get in touch with  her regular pain physician.  Now ID is changed her recommendation to stop on 07/09/2021 with p.o. ciprofloxacin and they have signed off the case given that her inflammatory markers are downtrending.       Assessment & Plan:   Principal Problem:   SIRS (systemic inflammatory response syndrome) ([name]) Active Problems:   Crohn's disease of small & large intestine with SGS    Hypertension   Short gut syndrome   GERD (gastroesophageal reflux disease)   Chronic pain syndrome   On total parenteral nutrition (TPN)   LFTs abnormal   Pancytopenia ([name])   Transaminitis   Hypophosphatemia  1 sepsis secondary to actinobacter urdingii bacteremia, POA -Patient presented with symptoms concerning for sepsis. -Patient pancultured with blood cultures positive for actinobacter bacteremia. -Patient with no history of PICC line infections. -Patient initially empirically on broad-spectrum IV antibiotics of vancomycin, cefepime, Flagyl, Rocephin. -Patient seen in consultation by ID and antibiotics have been transitioned to oral ciprofloxacin with ID recommending continuation at discharge through 07/09/2021. -Patient noted to have had low-grade temperature of 100.2 and no surgeries.   -Repeat blood cultures ordered with no growth to date x4 days.  -Per ID no indication for line exchange at this time and recommending continuation of ciprofloxacin. -ID was following but have signed off. -Supportive care.  2.  Pancytopenia -Likely secondary to sepsis. -Counts improved and stable.   -Continue antibiotics.    3.  Short gut syndrome -On chronic TPN. -TPN initially held given bacteremia however cleared by ID to resume and as such TPN has been resumed.  4.  Abnormal LFTs/transaminitis -In the setting of TPN and bacteremia. -LFTs trended down and have improved. -Outpatient follow-up.  5.  GERD -Continue Pepcid, PPI, sucralfate .  6.  Hypokalemia/hypophosphatemia/hypomagnesemia -Potassium at 3.5, phosphorus of 4.2, magnesium at 1.9.  -Repeat labs in the morning.    7.  Chronic pain syndrome -Patient noted to have increased requirements of IV Dilaudid for her pain early on during the hospitalization.. -Continue methadone 5 mg p.o. 5 times daily, oral hydromorphone as needed for breakthrough pain, IV hydromorphone as needed severe pain. -Continue Robaxin. -Cymbalta dose  increased 220 mg daily per palliative care recommendations. -Continue scheduled Tylenol, lidocaine patch. -Palliative care consulted and following and discussed with patient's pain specialist in the outpatient setting who recommended continuing IV Dilaudid while in-house and transitioning back to home regimen on discharge.  8.  Crohn's disease of the small and large intestine with short gut syndrome -Continue home regimen TPN, budesonide, teduglutide. -Outpatient follow-up with primary gastroenterologist at [facility].  9.  Left shoulder pain -Patient with complaints of left shoulder pain during this hospitalization had an MRI done in the outpatient setting which did not show any acute findings and being followed by PCP. -Patient noted to be referred to orthopedic surgery with an appointment for August 1. -Due to ongoing complaints of pain and bacteremia MRI of left shoulder was done as well as MRI of the C-spine which were negative for osteomyelitis however did show some tendinosis of the distal supraspinatus tendon anteriorly, negative for rotator cuff tear, mild supraspinatus intramuscular edema which could reflect a muscle strain. -Continue current pain regimen. -Outpatient follow-up with orthopedics and pain MD.  10.  Acute kidney injury on chronic kidney disease stage IIIa/metabolic acidosis -Improved -Close to baseline.  11.  Depression/anxiety -Wellbutrin -Cymbalta increased to 120 mg daily per palliative care recommendations.   12.  Hypertension -Coreg, Norvasc.   13.  History of recurrent pancreatitis -Continue  Creon.      DVT prophylaxis: Lovenox Code Status: Full Family Communication: Updated patient.  No family at bedside. Disposition:   Status is: Inpatient  Remains inpatient appropriate because:Inpatient level of care appropriate due to severity of illness  Dispo:  Patient From: Home  Planned Disposition: Home with Health Care Svc  Medically stable for discharge:  No         Consultants:  ID:Dr. Linus Salmons 06/22/2021 Palliative care: Vinie Sill, NP 05/03/3418  Procedures:  CT abdomen and pelvis 06/19/2021 Chest x-ray 06/19/2021 MRI left shoulder 06/26/2021 MRI C-spine 06/26/2021  Antimicrobials: IV cefepime 06/22/2021>>>> 06/25/2021 IV Rocephin 06/19/2021>>>> 06/22/2021 IV Flagyl 06/20/2021>>>> 06/22/2021 IV vancomycin 06/19/2021>>>> 06/22/2021 Oral ciprofloxacin 06/25/2021>>>>> 07/09/2021   Subjective: Patient sitting up on the side of the bed.  States neck pain has improved.  Still complaining of left upper extremity intermittent pain.  No chest pain.  No shortness of breath.  Overall feeling better.    Objective: Vitals:   06/28/21 2145 06/29/21 0502 06/29/21 0643 06/29/21 1053  BP: 134/90 (!) 164/79 (!) 163/84 132/71  Pulse: (!) 106 70  68  Resp: 20 17  20   Temp: 98.5 F (36.9 C) 98.5 F (36.9 C)  98.3 F (36.8 C)  TempSrc: Oral Oral  Oral  SpO2: 99% 100%  99%  Weight:  56.3 kg    Height:        Intake/Output Summary (Last 24 hours) at 06/29/2021 1310 Last data filed at 06/29/2021 0900 Gross per 24 hour  Intake 1888.76 ml  Output --  Net 1888.76 ml    Filed Weights   06/28/21 0430 06/28/21 0458 06/29/21 0502  Weight: 57 kg 56.3 kg 56.3 kg    Examination:  General exam: : NAD Respiratory system: CTA B.  No wheezes, no rhonchi.  Speaking in full sentences.  Normal respiratory effort. Cardiovascular system: Regular rate and rhythm no murmurs rubs or gallops.  No JVD.  No lower extremity edema.  Gastrointestinal system: Abdomen soft, nontender, nondistended, positive bowel sounds.  No rebound.  No guarding. Central nervous system: Alert and oriented. No focal neurological deficits. Extremities: Symmetric 5 x 5 power. Skin: No rashes, lesions or ulcers Psychiatry: Judgement and insight appear normal. Mood & affect appropriate.   Data Reviewed: I have personally reviewed following labs and imaging studies  CBC: Recent Labs  Lab  06/24/21 0402 06/25/21 0325 06/26/21 0410 06/27/21 0340 06/28/21 0422 06/29/21 0450  WBC 3.0* 3.3* 4.9 4.3 3.3* 4.0  NEUTROABS 1.3* 1.5* 2.8 2.1 1.5*  --   HGB 7.8* 7.6* 8.5* 8.0* 8.3* 8.3*  HCT 24.0* 23.6* 25.9* 24.3* 24.7* 25.8*  MCV 95.6 93.7 91.5 90.3 90.8 93.5  PLT 154 168 205 185 158 198     Basic Metabolic Panel: Recent Labs  Lab 06/25/21 0325 06/26/21 0410 06/27/21 0340 06/28/21 0422 06/29/21 0450  NA 140 135 139 138 140  K 3.2* 3.6 2.9* 3.3* 3.5  CL 112* 107 104 105 108  CO2 23 23 26 27 25   GLUCOSE 81 107* 90 96 112*  BUN 14 12 11 13 20   CREATININE 1.40* 1.26* 1.21* 1.29* 1.68*  CALCIUM 9.1 9.5 9.1 9.4 9.4  MG 2.1 1.7 1.6* 2.0 1.9  PHOS 2.6 2.0* 3.5 2.9 4.2     GFR: Estimated Creatinine Clearance: 31.7 mL/min (A) (by C-G formula based on SCr of 1.68 mg/dL (H)).  Liver Function Tests: Recent Labs  Lab 06/25/21 0325 06/26/21 0410 06/27/21 0340 06/28/21 0422 06/29/21 0450  AST 14* 15  14* 12* 12*  ALT 57* 54* 41 31 26  ALKPHOS 132* 158* 143* 131* 123  BILITOT 0.6 0.7 0.9 0.7 0.7  PROT 5.7* 6.8 6.2* 6.0* 6.1*  ALBUMIN 2.6* 3.0* 2.8* 2.8* 2.6*     CBG: Recent Labs  Lab 06/25/21 0528 06/25/21 1130 06/28/21 1840 06/28/21 2357 06/29/21 0630  GLUCAP 107* 94 135* 127* 100*      Recent Results (from the past 240 hour(s))  Resp Panel by RT-PCR (Flu A&B, Covid) Nasopharyngeal Swab     Status: None   Collection Time: 06/19/21  9:20 PM   Specimen: Nasopharyngeal Swab; Nasopharyngeal(NP) swabs in vial transport medium  Result Value Ref Range Status   SARS Coronavirus 2 by RT PCR NEGATIVE NEGATIVE Final    Comment: (NOTE) SARS-CoV-2 target nucleic acids are NOT DETECTED.  The SARS-CoV-2 RNA is generally detectable in upper respiratory specimens during the acute phase of infection. The lowest concentration of SARS-CoV-2 viral copies this assay can detect is 138 copies/mL. A negative result does not preclude SARS-Cov-2 infection and should not  be used as the sole basis for treatment or other patient management decisions. A negative result may occur with  improper specimen collection/handling, submission of specimen other than nasopharyngeal swab, presence of viral mutation(s) within the areas targeted by this assay, and inadequate number of viral copies(<138 copies/mL). A negative result must be combined with clinical observations, patient history, and epidemiological information. The expected result is Negative.  Fact Sheet for Patients:  EntrepreneurPulse.com.au  Fact Sheet for Healthcare Providers:  IncredibleEmployment.be  This test is no t yet approved or cleared by the [place] FDA and  has been authorized for detection and/or diagnosis of SARS-CoV-2 by FDA under an Emergency Use Authorization (EUA). This EUA will remain  in effect (meaning this test can be used) for the duration of the COVID-19 declaration under Section 564(b)(1) of the Act, 21 U.S.C.section 360bbb-3(b)(1), unless the authorization is terminated  or revoked sooner.       Influenza A by PCR NEGATIVE NEGATIVE Final   Influenza B by PCR NEGATIVE NEGATIVE Final    Comment: (NOTE) The Xpert Xpress SARS-CoV-2/FLU/RSV plus assay is intended as an aid in the diagnosis of influenza from Nasopharyngeal swab specimens and should not be used as a sole basis for treatment. Nasal washings and aspirates are unacceptable for Xpert Xpress SARS-CoV-2/FLU/RSV testing.  Fact Sheet for Patients: EntrepreneurPulse.com.au  Fact Sheet for Healthcare Providers: IncredibleEmployment.be  This test is not yet approved or cleared by the [place] FDA and has been authorized for detection and/or diagnosis of SARS-CoV-2 by FDA under an Emergency Use Authorization (EUA). This EUA will remain in effect (meaning this test can be used) for the duration of the COVID-19 declaration under Section  564(b)(1) of the Act, 21 U.S.C. section 360bbb-3(b)(1), unless the authorization is terminated or revoked.  Performed at [facility], [place] [address] [place], [place] [address]   Urine Culture     Status: Abnormal   Collection Time: 06/19/21  9:45 PM   Specimen: In/Out Cath Urine  Result Value Ref Range Status   Specimen Description   Final    IN/OUT CATH URINE Performed at [place] [address] [place], [place] [address]    Special Requests   Final    NONE Performed at [facility], [place] [address], [place], [place] [address]    Culture (A)  Final    4,000 COLONIES/mL STAPHYLOCOCCUS EPIDERMIDIS 10,000 COLONIES/mL LACTOBACILLUS  SPECIES Standardized susceptibility testing for this organism is not available. Performed at [facility], [place] [address] [place], [place] [address]    Report Status 06/21/2021 FINAL  Final  Blood Culture (routine x 2)     Status: Abnormal   Collection Time: 06/19/21  9:45 PM   Specimen: BLOOD  Result Value Ref Range Status   Specimen Description   Final    BLOOD RIGHT ANTECUBITAL Performed at [place] [address], [place], [place] [address]    Special Requests   Final    BOTTLES DRAWN AEROBIC AND ANAEROBIC Blood Culture adequate volume Performed at [place] [address] [place], [place] [address]    Culture  Setup Time   Final    GRAM NEGATIVE RODS AEROBIC BOTTLE ONLY CRITICAL RESULT CALLED TO, READ BACK BY AND VERIFIED WITH: PHARMD M TUCKER [id number] FCP CRITICAL RESULT CALLED TO, READ BACK BY AND VERIFIED WITH: pharmd Dillard [place] 06/22/21@11 :25 by tw    Culture (A)  Final    ACINETOBACTER URSINGII CRITICAL RESULT CALLED TO, READ BACK BY AND VERIFIED WITH: Trude Mcburney [id number] FCP Performed at [facility], [place] [address] [place], [place] [address]    Report Status 06/25/2021 FINAL  Final   Organism  ID, Bacteria ACINETOBACTER URSINGII  Final      Susceptibility   Acinetobacter ursingii - MIC*    CEFTAZIDIME 8 SENSITIVE Sensitive     CIPROFLOXACIN 0.5 SENSITIVE Sensitive     GENTAMICIN <=1 SENSITIVE Sensitive     IMIPENEM <=0.25 SENSITIVE Sensitive     TRIMETH/SULFA 80 RESISTANT Resistant     AMPICILLIN/SULBACTAM <=2 SENSITIVE Sensitive     * ACINETOBACTER URSINGII  Blood Culture ID Panel (Reflexed)     Status: None   Collection Time: 06/19/21  9:45 PM  Result Value Ref Range Status   Enterococcus faecalis NOT DETECTED NOT DETECTED Final   Enterococcus Faecium NOT DETECTED NOT DETECTED Final   Listeria monocytogenes NOT DETECTED NOT DETECTED Final   Staphylococcus species NOT DETECTED NOT DETECTED Final   Staphylococcus aureus (BCID) NOT DETECTED NOT DETECTED Final   Staphylococcus epidermidis NOT DETECTED NOT DETECTED Final   Staphylococcus lugdunensis NOT DETECTED NOT DETECTED Final   Streptococcus species NOT DETECTED NOT DETECTED Final   Streptococcus agalactiae NOT DETECTED NOT DETECTED Final   Streptococcus pneumoniae NOT DETECTED NOT DETECTED Final   Streptococcus pyogenes NOT DETECTED NOT DETECTED Final   A.calcoaceticus-baumannii NOT DETECTED NOT DETECTED Final   Bacteroides fragilis NOT DETECTED NOT DETECTED Final   Enterobacterales NOT DETECTED NOT DETECTED Final   Enterobacter cloacae complex NOT DETECTED NOT DETECTED Final   Escherichia coli NOT DETECTED NOT DETECTED Final   Klebsiella aerogenes NOT DETECTED NOT DETECTED Final   Klebsiella oxytoca NOT DETECTED NOT DETECTED Final   Klebsiella pneumoniae NOT DETECTED NOT DETECTED Final   Proteus species NOT DETECTED NOT DETECTED Final   Salmonella species NOT DETECTED NOT DETECTED Final   Serratia marcescens NOT DETECTED NOT DETECTED Final   Haemophilus influenzae NOT DETECTED NOT DETECTED Final   Neisseria meningitidis NOT DETECTED NOT DETECTED Final   Pseudomonas aeruginosa NOT DETECTED NOT DETECTED Final    Stenotrophomonas maltophilia NOT DETECTED NOT DETECTED Final   Candida albicans NOT DETECTED NOT DETECTED Final   Candida auris NOT DETECTED NOT DETECTED Final   Candida glabrata NOT DETECTED NOT DETECTED Final   Candida krusei NOT DETECTED NOT DETECTED Final   Candida parapsilosis NOT  DETECTED NOT DETECTED Final   Candida tropicalis NOT DETECTED NOT DETECTED Final   Cryptococcus neoformans/gattii NOT DETECTED NOT DETECTED Final    Comment: Performed at [facility], [place] [address], [place], [place] [address]  MRSA Next Gen by PCR, Nasal     Status: None   Collection Time: 06/20/21  6:27 PM   Specimen: Nasal Mucosa; Nasal Swab  Result Value Ref Range Status   MRSA by PCR Next Gen NOT DETECTED NOT DETECTED Final    Comment: (NOTE) The GeneXpert MRSA Assay (FDA approved for NASAL specimens only), is one component of a comprehensive MRSA colonization surveillance program. It is not intended to diagnose MRSA infection nor to guide or monitor treatment for MRSA infections. Test performance is not FDA approved in patients less than 38 years old. Performed at [facility], [place] [address], [place], [place] [address]   Culture, blood (routine x 2)     Status: None (Preliminary result)   Collection Time: 06/25/21 12:49 PM   Specimen: BLOOD  Result Value Ref Range Status   Specimen Description   Final    BLOOD RIGHT ANTECUBITAL Performed at [place] [address] [place], [place] [address]    Special Requests   Final    BOTTLES DRAWN AEROBIC ONLY Blood Culture results may not be optimal due to an inadequate volume of blood received in culture bottles Performed at [place] [address] [place], [place] [address]    Culture   Final    NO GROWTH 4 DAYS Performed at [facility], [place] [address], [place], [place] [address]    Report Status PENDING  Incomplete  Culture, blood (routine x 2)     Status: None  (Preliminary result)   Collection Time: 06/25/21  1:38 PM   Specimen: BLOOD  Result Value Ref Range Status   Specimen Description   Final    BLOOD BLOOD RIGHT HAND Performed at [place] [address], [place], [place] [address]    Special Requests   Final    BOTTLES DRAWN AEROBIC ONLY Blood Culture results may not be optimal due to an inadequate volume of blood received in culture bottles Performed at [place] [address], [place], [place] [address]    Culture   Final    NO GROWTH 4 DAYS Performed at [facility], [place] [address] [place],  [address]    Report Status PENDING  Incomplete          Radiology Studies: No results found.      Scheduled Meds:  (feeding supplement) PROSource Plus  30 mL Oral BID BM   acetaminophen  1,000 mg Oral TID   acidophilus  1 capsule Oral Daily   amLODipine  10 mg Oral Daily   budesonide  9 mg Oral Daily   buPROPion ER  200 mg Oral Daily   calcium carbonate  500 mg Oral Daily   carvedilol  25 mg Oral BID WC   Chlorhexidine Gluconate Cloth  6 each Topical Daily   cholecalciferol  1,000 Units Oral Daily   ciprofloxacin  500 mg Oral BID   cycloSPORINE  1 drop Both Eyes BID   DULoxetine  120 mg Oral Daily   enoxaparin (LOVENOX) injection  40 mg Subcutaneous Q24H   estradiol  2 mg Oral Daily   feeding supplement  1 Container Oral Q24H   feeding supplement  237  mL Oral Q24H   hydrALAZINE  100 mg Oral TID   insulin aspart  0-9 Units Subcutaneous Q6H   levETIRAcetam  500 mg Oral BID   lidocaine  1 patch Transdermal Q24H   lipase/protease/amylase  36,000 Units Oral TID WC   methadone  5 mg Oral 5 X Daily   methocarbamol  500 mg Oral BID   pantoprazole  40 mg Oral Daily   polyvinyl alcohol  1 drop Both Eyes QID   sodium chloride flush  10-40 mL Intracatheter Q12H   sucralfate  1 g Oral TID WC & HS   Teduglutide (rDNA)  1.5 mg Subcutaneous Daily   vitamin B-12  1,000 mcg  Oral Daily   Continuous Infusions:  sodium chloride     sodium chloride 10 mL/hr at 06/29/21 1159   TPN ADULT (ION) 40 mL/hr at 06/28/21 1758   TPN ADULT (ION)       LOS: 9 days    Time spent: 40 minutes    Irine Seal, MD [facility]   To contact the attending provider between 7A-7P or the covering provider during after hours 7P-7A, please log into the web site [email or web address] and access using universal [place] password for that web site. If you do not have the password, please call the hospital operator.  06/29/2021, 1:10 PM

## 2021-06-29 NOTE — Progress Notes (Signed)
Gave patient PRN pain medication due to elevated BP, which can increase with pain. Passed along to dayshift that BP would need to be rechecked.

## 2021-06-29 NOTE — Progress Notes (Addendum)
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  Assessment: 60 year old female on chronic TPN followed by [facility] for short gut syndrome admitted for sepsis from unknown source (possible bacteremia).  Patient is on cyclic TPN over 12 hours at home.  Of note, previous admission in June 2022- TPN was held for a short period of time due to elevated liver enzymes.  TPN started on 7/27 and d/ced on 7/28 d/t bacteremia.Patient is now being treated with abx for acinetobacter bacteremia secondary to line infection.  ID team is ok with resuming TPN back on 06/28/21.  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, Crohn's s/p multiple SBR's, short gut syndrome, chronic diarrhea, anasarca, h/o diverticulosis, B12 deficiency, GERD  Glucose / Insulin: sSSI q6 - CBGs (goal <150): 135, 127, 100 at goal Electrolytes: K 3.5 up 12mq KCL PO x 1 ordered per Md, mag 1.9 Renal: Scr 1.68 up, est CrCl 32, BUN 20 up Hepatic: AST low, Tbili wnl; alk phos trending down 123 -  prealbumin 22.9 (7/27), 24.2 (8/2) - albumin 2.6 - TG 91 (7/27) Intake / Output; MIVF: NS at 20 ml/hr - I/O: +2342 mL GI Imaging: - 7/25 abd CT: Avascular necrosis of the left femoral head without acute fracture or cortical collapse GI Surgeries / Procedures: n/a Ordered GI Medications: Creon 36,000 units TID w/ meals, Carafate 1g TID w/ meals and QHS, PPI, pepcid prn  Central access: PICC line placed 04/03/21 TPN start date: on TPN PTA  Nutritional Goals (Dietician's recom on 8/2): Kcal:  1800-2000 kcal Protein:  90-100 grams Fluid:  >/= 2.2 L/day  Goal TPN rate is 100 mL/hr (provides 86 g of protein and 1852 kcals per day) - patient will attain goal protein amount with protein content from boost/ensure  PTA TPN Rx (see  media tab for Rx image): total 1088 kCal (w/o Lipids) and 1688 kcal w/ lipids; 340 kcal AA, 748 kcal CHO -Formula w/o lipids 4 days/week- receives SMOFlipid 60g on MWF. Adds Infuvite 10 mL and Tralement 162mto each bag daily.  -Dextrose 70%= 220g/day; Plenamine 15% = 85g/day; Kphos 24 mmol/day; K acetate 57 mEq/day; Mg 10 mEq/day; CaGlu 10 mEq/day; Na Acetate 76 mEq/day - Na = 83 mEq/L  - K = 51 mEq/L - Ca= 5.5 mEq/L - Mag = 5.5 mEq/L - Phos = 13 mmol/L - Cl:Ac = 1:3  ++ Will dose inpatient TPN using dietician's recom. for nutritional goals ++  Current Nutrition:  - Prosource plus bid - 8/2: boost 1 container daily  and ensure 237 ml/ daily ordered - on regular diet   Plan:  At 1800:  - Increase TPN from 40 mL/hr to 60 ml/hr at 1800 - Electrolytes in TPN: Na 8053mL, K 57m78m, Ca 5mEq66m Mg 5mEq/62mand Phos 15mmol61mCl:Ac--> 1:1 - Add standard MVI and trace elements to TPN  - Continue Sensitive q6h SSI and adjust as needed  - Reduce MIVF to KVO at 1800 - Monitor TPN labs on Mon/Thurs and as needed  Chaz Mcglasson Adrian SaranD, BCPS Secure Chat if ?s 06/29/2021 10:16 AM

## 2021-06-30 ENCOUNTER — Telehealth: Payer: Self-pay

## 2021-06-30 DIAGNOSIS — A4159 Other Gram-negative sepsis: Secondary | ICD-10-CM | POA: Diagnosis not present

## 2021-06-30 DIAGNOSIS — K50818 Crohn's disease of both small and large intestine with other complication: Secondary | ICD-10-CM | POA: Diagnosis not present

## 2021-06-30 DIAGNOSIS — G894 Chronic pain syndrome: Secondary | ICD-10-CM | POA: Diagnosis not present

## 2021-06-30 DIAGNOSIS — R7401 Elevation of levels of liver transaminase levels: Secondary | ICD-10-CM | POA: Diagnosis not present

## 2021-06-30 DIAGNOSIS — I10 Essential (primary) hypertension: Secondary | ICD-10-CM | POA: Diagnosis not present

## 2021-06-30 DIAGNOSIS — K219 Gastro-esophageal reflux disease without esophagitis: Secondary | ICD-10-CM | POA: Diagnosis not present

## 2021-06-30 LAB — CULTURE, BLOOD (ROUTINE X 2)
Culture: NO GROWTH
Culture: NO GROWTH

## 2021-06-30 LAB — COMPREHENSIVE METABOLIC PANEL
ALT: 24 U/L (ref 0–44)
AST: 11 U/L — ABNORMAL LOW (ref 15–41)
Albumin: 2.6 g/dL — ABNORMAL LOW (ref 3.5–5.0)
Alkaline Phosphatase: 112 U/L (ref 38–126)
Anion gap: 2 — ABNORMAL LOW (ref 5–15)
BUN: 26 mg/dL — ABNORMAL HIGH (ref 6–20)
CO2: 26 mmol/L (ref 22–32)
Calcium: 9.3 mg/dL (ref 8.9–10.3)
Chloride: 111 mmol/L (ref 98–111)
Creatinine, Ser: 1.56 mg/dL — ABNORMAL HIGH (ref 0.44–1.00)
GFR, Estimated: 38 mL/min — ABNORMAL LOW (ref >60)
Glucose, Bld: 109 mg/dL — ABNORMAL HIGH (ref 70–99)
Potassium: 3.5 mmol/L (ref 3.5–5.1)
Sodium: 139 mmol/L (ref 135–145)
Total Bilirubin: 0.5 mg/dL (ref 0.3–1.2)
Total Protein: 5.7 g/dL — ABNORMAL LOW (ref 6.5–8.1)

## 2021-06-30 LAB — GLUCOSE, CAPILLARY
Glucose-Capillary: 107 mg/dL — ABNORMAL HIGH (ref 70–99)
Glucose-Capillary: 109 mg/dL — ABNORMAL HIGH (ref 70–99)
Glucose-Capillary: 112 mg/dL — ABNORMAL HIGH (ref 70–99)
Glucose-Capillary: 123 mg/dL — ABNORMAL HIGH (ref 70–99)
Glucose-Capillary: 97 mg/dL (ref 70–99)

## 2021-06-30 LAB — PHOSPHORUS: Phosphorus: 4.3 mg/dL (ref 2.5–4.6)

## 2021-06-30 LAB — MAGNESIUM: Magnesium: 1.8 mg/dL (ref 1.7–2.4)

## 2021-06-30 MED ORDER — LIDOCAINE 5 % EX PTCH
1.0000 | MEDICATED_PATCH | CUTANEOUS | 0 refills | Status: DC
Start: 2021-06-30 — End: 2021-11-16

## 2021-06-30 MED ORDER — TRACE MINERALS CU-MN-SE-ZN 300-55-60-3000 MCG/ML IV SOLN
INTRAVENOUS | Status: DC
  Filled 2021-06-30: qty 691.2

## 2021-06-30 MED ORDER — CALCIUM CARBONATE ANTACID 500 MG PO CHEW
500.0000 mg | CHEWABLE_TABLET | ORAL | Status: DC
  Administered 2021-06-30: 500 mg via ORAL
  Filled 2021-06-30: qty 3

## 2021-06-30 MED ORDER — HEPARIN SOD (PORK) LOCK FLUSH 100 UNIT/ML IV SOLN
250.0000 [IU] | INTRAVENOUS | Status: AC | PRN
  Administered 2021-06-30: 250 [IU]
  Filled 2021-06-30: qty 2.5

## 2021-06-30 MED ORDER — CIPROFLOXACIN HCL 500 MG PO TABS: 500.0000 mg | ORAL_TABLET | Freq: Two times a day (BID) | ORAL | 0 refills | Status: DC

## 2021-06-30 MED ORDER — PROSOURCE PLUS PO LIQD: 30.0000 mL | Freq: Two times a day (BID) | ORAL | Status: DC

## 2021-06-30 MED ORDER — ENSURE ENLIVE PO LIQD: 237.0000 mL | ORAL | 12 refills | Status: DC

## 2021-06-30 MED ORDER — DULOXETINE HCL 60 MG PO CPEP: 120.0000 mg | ORAL_CAPSULE | Freq: Every day | ORAL | 1 refills | Status: DC

## 2021-06-30 MED ORDER — SUCRALFATE 1 GM/10ML PO SUSP: 1.0000 g | Freq: Two times a day (BID) | ORAL | 2 refills | Status: DC

## 2021-06-30 MED ORDER — HYDRALAZINE HCL 100 MG PO TABS: 100.0000 mg | ORAL_TABLET | Freq: Three times a day (TID) | ORAL | 1 refills | Status: DC

## 2021-06-30 MED ORDER — PANCRELIPASE (LIP-PROT-AMYL) 36000-114000 UNITS PO CPEP: 36000.0000 [IU] | ORAL_CAPSULE | ORAL | Status: AC

## 2021-06-30 MED ORDER — BUDESONIDE 3 MG PO CPEP
3.0000 mg | ORAL_CAPSULE | ORAL | Status: DC
Start: 2021-06-30 — End: 2021-09-25

## 2021-06-30 MED ORDER — ACETAMINOPHEN 325 MG PO TABS: 650.0000 mg | ORAL_TABLET | Freq: Four times a day (QID) | ORAL | Status: DC | PRN

## 2021-06-30 MED ORDER — ACETAMINOPHEN 500 MG PO TABS: 1000.0000 mg | ORAL_TABLET | Freq: Three times a day (TID) | ORAL | 0 refills | Status: DC

## 2021-06-30 MED ORDER — SUCRALFATE 1 GM/10ML PO SUSP
1.0000 g | Freq: Two times a day (BID) | ORAL | Status: DC
  Administered 2021-06-30: 1 g via ORAL
  Filled 2021-06-30: qty 10

## 2021-06-30 MED ORDER — PROMETHAZINE HCL 25 MG PO TABS: 25.0000 mg | ORAL_TABLET | Freq: Four times a day (QID) | ORAL | Status: DC | PRN

## 2021-06-30 NOTE — Care Management Important Message (Signed)
Important Message  Patient Details IM Letter given to the Patient. Name: Caitlin Peters MRN: [id number] Date of Birth: July 20, 1961   Medicare Important Message Given:  Yes     [name] 06/30/2021, 11:02 AM

## 2021-06-30 NOTE — Progress Notes (Signed)
Spoke with on call provider about patient's blood pressure being elevated with a BP of 176/74. Patient was having pain as well. Thus, gave PRN dilaudid. Rechecked patient's blood pressure after PRN was given, and it was 170/75. Passed it along to dayshift nurse.

## 2021-06-30 NOTE — Plan of Care (Signed)

## 2021-06-30 NOTE — Discharge Instructions (Signed)
==========================================================================   To prevent drug interaction, take the following medications at these specific times:  Ciprofloxacin:  8 am, 8 pm Sucralfate:      10 am, 10 pm Calcium:          2 pm

## 2021-06-30 NOTE — Discharge Summary (Signed)
Physician Discharge Summary  Caitlin Peters TKP:[id number] DOB: 05/29/1961 DOA: 06/19/2021  PCP: [name], [name], MD  Admit date: 06/19/2021 Discharge date: 06/30/2021  Time spent: 60 minutes  Recommendations for Outpatient Follow-up:  Follow-up with Dr. Baxter Flattery, ID in 2 weeks. Follow-up with pain specialist at pain clinic in 1 week for continued management of chronic pain. Follow-up with [name], [name], MD in 1 to 2 weeks.  On follow-up patient need a comprehensive metabolic profile done to follow-up on electrolytes, renal function, LFTs.  Magnesium levels and phosphorus levels also need to be checked as well as a CBC.    Discharge Diagnoses:  Principal Problem:   Sepsis (Caitlin Peters) Active Problems:   Crohn's disease of both small and large intestine with other complication (Caitlin Peters)   Hypertension   Short gut syndrome   GERD (gastroesophageal reflux disease)   Chronic pain syndrome   On total parenteral nutrition (TPN)   LFTs abnormal   Pancytopenia ([name])   Transaminitis   Hypophosphatemia   Discharge Condition: Stable and improved  Diet recommendation: Regular  Filed Weights   06/28/21 0458 06/29/21 0502 06/30/21 0500  Weight: 56.3 kg 56.3 kg 59.6 kg    History of present illness:  HPI per Dr. [name] is a 60 y.o. female with medical history significant for short gut syndrome requiring TPN, Crohn's disease with multiple surgeries, chronic pain, chronic PICC line with poor IV access, who presents to the emergency department on 06/19/21 with fever and chills. She reports she has not felt well since 05/29/21; she was in the hospital in late June 2022. Since leaving the hospital she has noticed pain in her left shoulder/arm. The fever and chills began the day before admission and have been intermittent. She reports diaphoresis at night. Fever at home was 102 degrees F. She has chronic abdominal pain, located in the upper abdomen and left side, radiates  to back, is up to 7-8/10 at times and is characterized as cramping, sharp, and dull at times. Abdominal pain is alleviated by nothing and exacerbated by eating. Fever and chills are alleviated by nothing and exacerbated by nothing. Associated symptoms: Nausea. Vomiting intermittently over the last month. Chronic diarrhea currently more like jello. No bloody stool. Dysuria but no hematuria. No redness or pain around her PICC line site. No shortness of breath, cough, wheezing, chest pain, palpitations. Chronic back pain that is unchanged.    ED Course: Vitals include temperature 100.8 F, pulse 104.  Labs include WBC is 2.9, hemoglobin 7.6, platelets 111, MCV 92.8, lactic acid 0.6, lipase 38, AST 544, ALT 365, total protein 5.7, total bilirubin 1, INR 1, PTT 30.  SARS-CoV-2 PCR is negative.  Urinalysis shows no leukocytes or nitrite.  Cultures were drawn.  Pharmacy was consulted for antibiotic due to patient's multiple allergies; patient was given IV vancomycin and IV ceftriaxone 2 g x 1.     History:   Admission 05/22/21 through 05/25/21 for left shoulder pain; she was referred to the ED from the ID clinic. Also had diarrhea, vomiting, and abdominal pain. Had acute kidney injury. MRI left shoulder revealed only mild tendinopathy. CT abdomen/pelvis showed resolution of colitis and large fecal matter/gas. C diff was negative.   She last had her PICC line replaced in May 2022. She has weekly dressing changes and changed the dressing herself on 06/17/21.  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 -Patient pancultured with  blood cultures positive for actinobacter bacteremia. -Patient with history of PICC line infections. -Patient initially empirically on broad-spectrum IV antibiotics of vancomycin, cefepime, Flagyl, Rocephin. -Patient seen in consultation by ID and antibiotics been transitioned to oral ciprofloxacin  with ID recommending continuation at discharge through 07/09/2021. -Patient noted to have had low-grade temperature of 100.2 and no surgeries.   -Repeat blood cultures ordered with no growth to date.  -Per ID no indication for line exchange at this time and recommended continuation of ciprofloxacin. -ID was following but have signed off. -Patient was discharged home to complete course of oral antibiotics through 07/09/2021. -Outpatient follow-up with ID.  2.  Pancytopenia -Likely secondary to sepsis. -Counts improved with treatment of infection.   -Outpatient follow-up with PCP.   3.  Short gut syndrome -On chronic TPN. -TPN initially held given bacteremia however cleared by ID to resume and as such TPN has been resumed. -Patient with discharge back home on home regimen TPN -Outpatient follow-up with primary gastroenterologist.  4.  Abnormal LFTs/transaminitis -In the setting of TPN and bacteremia. -LFTs trended down and improved.  -Outpatient follow-up.  5.  GERD -Patient maintained on home regimen of Pepcid, PPI, sucralfate .  6.  Hypokalemia/hypophosphatemia/hypomagnesemia -Repleted during the hospitalization.  Outpatient follow-up.  7.  Chronic pain syndrome -Patient noted to have increased requirements of IV Dilaudid for her pain early on during the hospitalization.. -Patient maintained on home regimen of methadone 5 mg p.o. 5 times daily, oral hydromorphone as needed for breakthrough pain. -IV hydromorphone as needed severe pain and Robaxin. -Cymbalta dose increased 120 mg daily per palliative care recommendations. -Patient also maintained on scheduled Tylenol, lidocaine patch. -Palliative care consulted and discussed with patient's pain specialist in the outpatient setting who recommended continuing IV Dilaudid while in-house and transitioning back to home regimen on discharge. -Outpatient follow-up with patient's pain specialist  8.  Crohn's disease of the small and large  intestine with short gut syndrome -Patient maintained on home regimen TPN, budesonide, teduglutide. -Outpatient follow-up with primary gastroenterologist at [facility].  9.  Left shoulder pain -Patient with complaints of left shoulder pain during this hospitalization had an MRI done in the outpatient setting which did not show any acute findings and being followed by PCP. -Patient noted to be referred to orthopedic surgery with an appointment for August 1. -Due to ongoing complaints of pain and bacteremia MRI of left shoulder was done as well as MRI of the C-spine which were negative for osteomyelitis however did show some tendinosis of the distal supraspinatus tendon anteriorly, negative for rotator cuff tear, mild supraspinatus intramuscular edema which could reflect a muscle strain. -Patient maintained on IV Dilaudid as needed for pain control, scheduled Tylenol, Cymbalta, Robaxin, lidocaine patch, home regimen methadone, and home regimen oral Dilaudid as needed.  -Outpatient follow-up with orthopedics and pain MD.  10.  Acute kidney injury on chronic kidney disease stage IIIa/metabolic acidosis -Improved -Close to baseline. -Outpatient follow-up.  11.  Depression/anxiety -Patient maintained on home regimen of Wellbutrin.   -Patient Cymbalta dose increased 220 mg daily per palliative care to also aid with pain management.   -Outpatient follow-up.  12.  Hypertension -Controlled on home regimen of Coreg, Norvasc.   13.  History of recurrent pancreatitis -Patient maintained on Creon.    Procedures: CT abdomen and pelvis 06/19/2021 Chest x-ray 06/19/2021 MRI left shoulder 06/26/2021 MRI C-spine 06/26/2021  Consultations: ID:Dr. Linus Salmons 06/22/2021 Palliative care: Vinie Sill, NP 02/01/7563  Discharge Exam: Vitals:   06/30/21 0740 06/30/21  1131  BP: (!) 170/75 121/71  Pulse:  70  Resp:  18  Temp:  98.4 F (36.9 C)  SpO2:  100%    General: NAD Cardiovascular: Regular rate and rhythm  with grade 3/6 systolic ejection murmur. Respiratory: CTA B.  No wheezes, no crackles, no rhonchi.  Discharge Instructions   Discharge Instructions     Diet general   Complete by: As directed    Increase activity slowly   Complete by: As directed       Allergies as of 06/30/2021       Reactions   Meperidine Hives   Other reaction(s): GI Upset Due to Chrones   Hyoscyamine Hives, Swelling   Legs swelling Disorientation   Cefepime Other (See Comments)   Neurotoxicity occurring in setting of AKI. Ceftriaxone tolerated during same admit   Gabapentin Other (See Comments)   unknown   Lyrica [pregabalin] Other (See Comments)   unknown   Topamax [topiramate] Other (See Comments)   unknown   Zosyn [piperacillin Sod-tazobactam So] Other (See Comments)   Patient reports it makes her vomit, her neck stiff, and her "heart feel funny"   Fentanyl Rash   Pt is allergic to fentanyl patch related to the glue (gives her a rash) Pt states she is NOT allergic to fentanyl IV medicine   Morphine And Related Rash        Medication List     STOP taking these medications    Dextran 70-Hypromellose 0.1-0.3 % Soln   diphenoxylate-atropine 2.5-0.025 MG tablet Commonly known as: LOMOTIL   loperamide 2 MG capsule Commonly known as: IMODIUM       TAKE these medications    (feeding supplement) PROSource Plus liquid Take 30 mLs by mouth 2 (two) times daily between meals.   feeding supplement Liqd Take 237 mLs by mouth daily.   acetaminophen 500 MG tablet Commonly known as: TYLENOL Take 2 tablets (1,000 mg total) by mouth 3 (three) times daily for 7 days. What changed: You were already taking a medication with the same name, and this prescription was added. Make sure you understand how and when to take each.   acetaminophen 325 MG tablet Commonly known as: TYLENOL Take 2 tablets (650 mg total) by mouth every 6 (six) hours as needed for mild pain. Start taking on: July 07, 2021 What changed: These instructions start on July 07, 2021. If you are unsure what to do until then, ask your doctor or other care provider.   amLODipine 10 MG tablet Commonly known as: NORVASC Take 10 mg by mouth daily.   BIOFREEZE EX Apply 1 application topically as needed (pain). What changed: Another medication with the same name was removed. Continue taking this medication, and follow the directions you see here.   budesonide 3 MG 24 hr capsule Commonly known as: ENTOCORT EC Take 1 capsule (3 mg total) by mouth every 3 (three) days.   buPROPion ER 100 MG 12 hr tablet Commonly known as: WELLBUTRIN SR Take 200 mg by mouth daily.   Calcium 200 MG Tabs Take 200 mg by mouth daily.   carvedilol 25 MG tablet Commonly known as: COREG Take 1 tablet (25 mg total) by mouth 2 (two) times daily with a meal.   cholecalciferol 25 MCG (1000 UNIT) tablet Commonly known as: VITAMIN D3 Take 1,000 Units by mouth daily.   ciprofloxacin 500 MG tablet Commonly known as: CIPRO Take 1 tablet (500 mg total) by mouth 2 (two) times daily for 9  days.   cycloSPORINE 0.05 % ophthalmic emulsion Commonly known as: RESTASIS Place 1 drop into both eyes 2 (two) times daily.   denosumab 60 MG/ML Sosy injection Commonly known as: PROLIA Inject 60 mg into the skin every 6 (six) months.   dexlansoprazole 60 MG capsule Commonly known as: Dexilant Take 1 capsule (60 mg total) by mouth daily.   DULoxetine 60 MG capsule Commonly known as: CYMBALTA Take 2 capsules (120 mg total) by mouth daily. What changed:  medication strength how much to take   estradiol 2 MG tablet Commonly known as: ESTRACE Take 1 tablet (2 mg total) by mouth daily.   famotidine 20 MG tablet Commonly known as: PEPCID Take 20 mg by mouth daily as needed for heartburn or indigestion.   Gattex 5 MG Kit Generic drug: Teduglutide (rDNA) Inject 1.5 mg into the skin daily.   hydrALAZINE 100 MG tablet Commonly known as:  APRESOLINE Take 1 tablet (100 mg total) by mouth 3 (three) times daily.   HYDROmorphone 4 MG tablet Commonly known as: DILAUDID Take 4 mg by mouth in the morning, at noon, in the evening, and at bedtime.   levETIRAcetam 500 MG tablet Commonly known as: KEPPRA Take 500 mg by mouth 2 (two) times daily.   lidocaine 5 % Commonly known as: LIDODERM Place 1 patch onto the skin daily. Remove & Discard patch within 12 hours or as directed by MD   lipase/protease/amylase 36000 UNITS Cpep capsule Commonly known as: Creon Take 1 capsule (36,000 Units total) by mouth See admin instructions. 36000 units with meals   methadone 5 MG tablet Commonly known as: DOLOPHINE Take 1 tablet (5 mg total) by mouth 5 (five) times daily.   methocarbamol 500 MG tablet Commonly known as: Robaxin Take 1 tablet (500 mg total) by mouth in the morning and at bedtime.   MULTIVITAMIN ADULT PO Take 1 tablet by mouth daily.   Narcan 4 MG/0.1ML Liqd nasal spray kit Generic drug: naloxone Place 1 spray into the nose once as needed (overdose).   polyvinyl alcohol 1.4 % ophthalmic solution Commonly known as: LIQUIFILM TEARS Place 1 drop into both eyes as needed for dry eyes.   PRESCRIPTION MEDICATION Inject 1 each into the vein daily. Home TPN . [facility] . 1 bag for 12 hours. [phone]   PROBIOTIC-10 PO Take 1 capsule by mouth daily.   promethazine 25 MG tablet Commonly known as: PHENERGAN Take 1 tablet (25 mg total) by mouth every 6 (six) hours as needed for nausea. What changed: See the new instructions.   sodium chloride 0.9 % infusion Inject 1 mL into the vein daily as needed (flush).   sucralfate 1 GM/10ML suspension Commonly known as: Carafate Take 10 mLs (1 g total) by mouth 2 (two) times daily. What changed: when to take this   TRALEMENT IV Inject 1 mL into the vein See admin instructions. Used in TPN bag 4 times weekly   vitamin B-12 1000 MCG  tablet Commonly known as: CYANOCOBALAMIN Take 1,000 mcg by mouth daily.       Allergies  Allergen Reactions   Meperidine Hives    Other reaction(s): GI Upset Due to Chrones    Hyoscyamine Hives and Swelling    Legs swelling Disorientation   Cefepime Other (See Comments)    Neurotoxicity occurring in setting of AKI. Ceftriaxone tolerated during same admit   Gabapentin Other (See Comments)    unknown   Lyrica [Pregabalin] Other (See Comments)  unknown   Topamax [Topiramate] Other (See Comments)    unknown   Zosyn [Piperacillin Sod-Tazobactam So] Other (See Comments)    Patient reports it makes her vomit, her neck stiff, and her "heart feel funny"   Fentanyl Rash    Pt is allergic to fentanyl patch related to the glue (gives her a rash) Pt states she is NOT allergic to fentanyl IV medicine   Morphine And Related Rash    Follow-up Information     [name], [name], MD. Schedule an appointment as soon as possible for a visit in 2 week(s).   Specialty: Internal Medicine Why: f/u in 1-2 weeks. Contact information: [place] [address] [phone]         Carlyle Basques, MD. Schedule an appointment as soon as possible for a visit in 2 week(s).   Specialty: Infectious Diseases Contact information: [address] [place] [place] [address] [phone]         Pain clinic. Schedule an appointment as soon as possible for a visit in 1 week(s).                   The results of significant diagnostics from this hospitalization (including imaging, microbiology, ancillary and laboratory) are listed below for reference.    Significant Diagnostic Studies: CT Abdomen Pelvis Wo Contrast  Result Date: 06/19/2021 CLINICAL DATA:  60 year old female with abdominal pain and fever. EXAM: CT ABDOMEN AND PELVIS WITHOUT CONTRAST TECHNIQUE: Multidetector CT imaging of the abdomen and pelvis was performed following the standard  protocol without IV contrast. COMPARISON:  CT abdomen pelvis dated 06/12/2021. FINDINGS: Evaluation of this exam is limited in the absence of intravenous contrast. Evaluation is also limited due to paucity of intra-abdominal fat. Lower chest: Minimal bibasilar dependent atelectasis. The visualized lung bases are otherwise clear. No intra-abdominal free air or free fluid. Hepatobiliary: The liver is unremarkable. There is pneumobilia. Cholecystectomy. Pancreas: The pancreas is grossly unremarkable. Spleen: Normal in size without focal abnormality. Adrenals/Urinary Tract: The adrenal glands are unremarkable. The kidneys, visualized ureters, and urinary bladder appear unremarkable. Stomach/Bowel: Postsurgical changes of bowel with multiple surgical anastomotic sutures. Evaluation of the bowel is limited in the absence of oral contrast and paucity of intra-abdominal fat. No evidence of bowel obstruction. Vascular/Lymphatic: Mild aortoiliac atherosclerotic disease. Right femoral venous line with tip in the intrahepatic IVC. Several small scattered retroperitoneal and mesenteric lymph nodes. Reproductive: Hysterectomy. Other: There is diffuse subcutaneous edema. No fluid collection. Musculoskeletal: Osteopenia with degenerative changes of the spine. Avascular necrosis of the left femoral head without acute fracture or cortical collapse. No acute osseous pathology. IMPRESSION: 1. No acute intra-abdominal or pelvic pathology. 2. Postsurgical changes of bowel. No evidence of bowel obstruction. 3. Avascular necrosis of the left femoral head without acute fracture or cortical collapse. 4. Aortic Atherosclerosis (ICD10-I70.0). Electronically Signed   By: Anner Crete M.D.   On: 06/19/2021 23:22   MR CERVICAL SPINE WO CONTRAST  Result Date: 06/26/2021 CLINICAL DATA:  Cervical radiculopathy and shoulder pain with infection suspected. EXAM: MRI CERVICAL SPINE WITHOUT CONTRAST TECHNIQUE: Multiplanar, multisequence MR imaging  of the cervical spine was performed. No intravenous contrast was administered. COMPARISON:  06/02/2021 FINDINGS: Motion degraded study. Alignment: Physiologic. Vertebrae: No fracture, evidence of discitis, or bone lesion. Cord: Normal signal and morphology. Posterior Fossa, vertebral arteries, paraspinal tissues: Negative. Disc levels: C1-2: Unremarkable. C2-3: Normal disc space and facet joints. There is no spinal canal stenosis. No neural foraminal stenosis. C3-4:  Normal disc space and facet joints. There is no spinal canal stenosis. No neural foraminal stenosis. C4-5: Normal disc space and facet joints. There is no spinal canal stenosis. No neural foraminal stenosis. C5-6: Small disc bulge uncovertebral spurring. There is no spinal canal stenosis. Mild right and moderate left neural foraminal stenosis. C6-7: Disc space narrowing with endplate spurring. There is no spinal canal stenosis. Mild bilateral neural foraminal stenosis. C7-T1: Normal disc space and facet joints. There is no spinal canal stenosis. No neural foraminal stenosis. IMPRESSION: 1. Motion degraded study. 2. No acute abnormality of the cervical spine. 3. Mild right and moderate left neural foraminal stenosis at C5-6. 4. Mild bilateral C6-7 neural foraminal stenosis. Electronically Signed   By: Ulyses Jarred M.D.   On: 06/26/2021 23:23   MR CERVICAL SPINE WO CONTRAST  Result Date: 06/03/2021 CLINICAL DATA:  Initial evaluation for cervical radiculopathy. EXAM: MRI CERVICAL SPINE WITHOUT CONTRAST TECHNIQUE: Multiplanar, multisequence MR imaging of the cervical spine was performed. No intravenous contrast was administered. COMPARISON:  None available. FINDINGS: Alignment: Examination technically limited as the patient was unable to tolerate the full length of the study. Sagittal T1 and STIR sequences only were performed. No axial imaging is provided. Straightening of the normal cervical lordosis.  No listhesis. Vertebrae: Vertebral body height  maintained without acute or chronic fracture. Bone marrow signal intensity grossly within normal limits on this limited exam. No discrete or worrisome osseous lesions. No abnormal marrow edema. No findings to suggest osteomyelitis discitis or septic arthritis. Cord: Signal intensity within the cervical spinal cord is grossly within normal limits on this limited exam. Posterior Fossa, vertebral arteries, paraspinal tissues: Visualized brain and posterior fossa within normal limits. Craniocervical junction normal. Limited assessment of the paraspinous and prevertebral soft tissues grossly unremarkable. Disc levels: C2-C3: Unremarkable. C3-C4:  Unremarkable. C4-C5:  Unremarkable. C5-C6: Degenerative intervertebral disc space narrowing with diffuse disc bulge and bilateral uncovertebral spurring. Mild spinal stenosis. Foramina not assessed on this limited exam. C6-C7: Degenerative intervertebral disc space narrowing with diffuse disc bulge and bilateral uncovertebral spurring. Mild spinal stenosis. Foramina not well assessed on this limited exam. C7-T1:  Unremarkable. Visualized upper thoracic spine demonstrates no significant finding. IMPRESSION: 1. Technically limited exam due to patient's inability to tolerate the full length of the study. Sagittal T1 and STIR sequences only were performed. 2. Degenerative disc bulging at C5-6 and C6-7 with resultant mild spinal stenosis. Foramina not well assessed on this limited exam. 3. No other definite acute abnormality within the cervical spine. Electronically Signed   By: Jeannine Boga M.D.   On: 06/03/2021 00:42   CT Abdomen Pelvis W Contrast  Result Date: 06/12/2021 CLINICAL DATA:  Abdominal pain and fever. History of Crohn's disease EXAM: CT ABDOMEN AND PELVIS WITH CONTRAST TECHNIQUE: Multidetector CT imaging of the abdomen and pelvis was performed using the standard protocol following bolus administration of intravenous contrast. CONTRAST:  31m OMNIPAQUE  IOHEXOL 350 MG/ML SOLN COMPARISON:  05/23/2021, 12/15/2020 FINDINGS: Lower chest: Included lung bases are clear. Heart size within normal limits. Hepatobiliary: No focal liver lesion is identified. Prior cholecystectomy with mild intra and extrahepatic biliary dilatation. Minimal pneumobilia, less pronounced compared to the previous CT. Pancreas: Unchanged mild pancreatic ductal dilatation. Pancreas appears otherwise unremarkable. No peripancreatic inflammatory changes. Spleen: Normal in size without focal abnormality. Adrenals/Urinary Tract: Unremarkable adrenal glands. Kidneys enhance symmetrically without focal lesion, stone, or hydronephrosis. Ureters are nondilated. Urinary bladder appears unremarkable. Stomach/Bowel: Extensive postsurgical changes are again seen involving the large and small  bowel within the abdomen and pelvis. There are a few mildly prominent loops of small bowel with air-fluid level in the central abdomen, less prominent when compared with the most recent previous study. No evidence to suggest high-grade bowel obstruction. Long segment circumferential wall thickening of descending colon suggesting colitis. Vascular/Lymphatic: Scattered aortoiliac atherosclerotic calcifications without aneurysm. Circumaortic left renal vein. Right femoral approach central venous catheter remains in place terminating within the intrahepatic IVC. No abdominopelvic lymphadenopathy. Reproductive: Status post hysterectomy. No adnexal masses. Other: No free fluid. No abdominopelvic fluid collection. No pneumoperitoneum. No abdominal wall hernia. Musculoskeletal: No new or acute osseous findings. IMPRESSION: 1. Long segment circumferential wall thickening of the descending colon suggesting colitis, likely related to patient's known inflammatory bowel disease. 2. There are a few mildly prominent loops of small bowel with air-fluid level in the central abdomen, less prominent when compared with the most recent  previous study. Findings are likely reactive. No evidence to suggest high-grade bowel obstruction. Aortic Atherosclerosis (ICD10-I70.0). Electronically Signed   By: Davina Poke D.O.   On: 06/12/2021 17:50   MR SHOULDER LEFT WO CONTRAST  Result Date: 06/27/2021 CLINICAL DATA:  Septic arthritis suspected, shoulder, xray done EXAM: MRI OF THE LEFT SHOULDER WITHOUT CONTRAST TECHNIQUE: Multiplanar, multisequence MR imaging of the shoulder was performed. No intravenous contrast was administered. COMPARISON:  MRI 05/23/2021, x-ray 06/12/2021 FINDINGS: Technical note: Despite efforts by the technologist and patient, motion artifact is present on today's exam and could not be eliminated. This reduces exam sensitivity and specificity. Rotator cuff: Mild tendinosis of the distal supraspinatus tendon anteriorly. Remaining rotator cuff tendons are grossly intact. Muscles:  Mild supraspinatus intramuscular edema. Biceps long head: Grossly intact. Trace tenosynovial fluid, similar to prior. Acromioclavicular Joint: No significant arthropathy of the AC joint. No AC joint effusion. Trace subacromial-subdeltoid bursal fluid. Glenohumeral Joint: Mild chondral thinning.  No joint effusion. Labrum: Grossly intact, but evaluation is limited by motion artifact and lack of intraarticular fluid. Bones: No acute fracture or dislocation. No erosion. No bone marrow edema. No suspicious bone lesion. Other: No soft tissue edema or fluid collection. No visible lymphadenopathy. IMPRESSION: 1. No evidence of septic arthritis or osteomyelitis involving the left shoulder. 2. Mild tendinosis of the distal supraspinatus tendon anteriorly. Negative for rotator cuff tear. 3. Mild supraspinatus intramuscular edema which could reflect a muscle strain. Early changes of myositis could have a similar appearance. Electronically Signed   By: Davina Poke D.O.   On: 06/27/2021 08:09   DG Chest Port 1 View  Result Date: 06/19/2021 CLINICAL DATA:   Questionable sepsis EXAM: PORTABLE CHEST 1 VIEW COMPARISON:  05/06/2021 FINDINGS: The heart size and mediastinal contours are within normal limits. Both lungs are clear. The visualized skeletal structures are unremarkable. IMPRESSION: No acute abnormality of the lungs in AP portable projection. Electronically Signed   By: Eddie Candle M.D.   On: 06/19/2021 20:56   DG Shoulder Left  Result Date: 06/12/2021 CLINICAL DATA:  Left shoulder pain EXAM: LEFT SHOULDER - 2+ VIEW COMPARISON:  05/23/2021 FINDINGS: There is no evidence of fracture or dislocation. Mild glenohumeral joint space narrowing. AC joint appears within normal limits. Soft tissues are unremarkable. IMPRESSION: Mild glenohumeral joint space narrowing.  No acute findings. Electronically Signed   By: Davina Poke D.O.   On: 06/12/2021 18:00   MM 3D SCREEN BREAST BILATERAL  Result Date: 06/10/2021 CLINICAL DATA:  Screening. EXAM: DIGITAL SCREENING BILATERAL MAMMOGRAM WITH TOMOSYNTHESIS AND CAD TECHNIQUE: Bilateral screening digital craniocaudal and mediolateral oblique mammograms  were obtained. Bilateral screening digital breast tomosynthesis was performed. The images were evaluated with computer-aided detection. COMPARISON:  Previous exam(s). ACR Breast Density Category c: The breast tissue is heterogeneously dense, which may obscure small masses. FINDINGS: There are no findings suspicious for malignancy. IMPRESSION: No mammographic evidence of malignancy. A result letter of this screening mammogram will be mailed directly to the patient. RECOMMENDATION: Screening mammogram in one year. (Code:SM-B-01Y) BI-RADS CATEGORY  1: Negative. Electronically Signed   By: Ammie Ferrier M.D.   On: 06/10/2021 14:54    Microbiology: Recent Results (from the past 240 hour(s))  MRSA Next Gen by PCR, Nasal     Status: None   Collection Time: 06/20/21  6:27 PM   Specimen: Nasal Mucosa; Nasal Swab  Result Value Ref Range Status   MRSA by PCR Next Gen NOT  DETECTED NOT DETECTED Final    Comment: (NOTE) The GeneXpert MRSA Assay (FDA approved for NASAL specimens only), is one component of a comprehensive MRSA colonization surveillance program. It is not intended to diagnose MRSA infection nor to guide or monitor treatment for MRSA infections. Test performance is not FDA approved in patients less than 39 years old. Performed at [facility], [place] [address], [place], [place] [address]   Culture, blood (routine x 2)     Status: None   Collection Time: 06/25/21 12:49 PM   Specimen: BLOOD  Result Value Ref Range Status   Specimen Description   Final    BLOOD RIGHT ANTECUBITAL Performed at [facility] [address] [place], [place] [address]    Special Requests   Final    BOTTLES DRAWN AEROBIC ONLY Blood Culture results may not be optimal due to an inadequate volume of blood received in culture bottles Performed at [facility] [address], [place], [place] [address]    Culture   Final    NO GROWTH 5 DAYS Performed at [facility], [place] [address], [place], [place] [address]    Report Status 06/30/2021 FINAL  Final  Culture, blood (routine x 2)     Status: None   Collection Time: 06/25/21  1:38 PM   Specimen: BLOOD  Result Value Ref Range Status   Specimen Description   Final    BLOOD BLOOD RIGHT HAND Performed at [facility] [address], [place], [place] [address]    Special Requests   Final    BOTTLES DRAWN AEROBIC ONLY Blood Culture results may not be optimal due to an inadequate volume of blood received in culture bottles Performed at [facility] [address], [place], [place] [address]    Culture   Final    NO GROWTH 5 DAYS Performed at [facility], [place] [address], [place],  [address]    Report Status 06/30/2021 FINAL  Final     Labs: Basic Metabolic Panel: Recent Labs  Lab 06/26/21 0410  06/27/21 0340 06/28/21 0422 06/29/21 0450 06/30/21 0350  NA 135 139 138 140 139  K 3.6 2.9* 3.3* 3.5 3.5  CL 107 104 105 108 111  CO2 23 26 27 25 26   GLUCOSE 107* 90 96 112* 109*  BUN 12 11 13 20  26*  CREATININE 1.26* 1.21* 1.29* 1.68* 1.56*  CALCIUM 9.5 9.1 9.4 9.4 9.3  MG 1.7 1.6* 2.0 1.9 1.8  PHOS 2.0* 3.5 2.9 4.2 4.3   Liver Function Tests: Recent Labs  Lab 06/26/21 0410 06/27/21 0340 06/28/21  0422 06/29/21 0450 06/30/21 0350  AST 15 14* 12* 12* 11*  ALT 54* 41 31 26 24   ALKPHOS 158* 143* 131* 123 112  BILITOT 0.7 0.9 0.7 0.7 0.5  PROT 6.8 6.2* 6.0* 6.1* 5.7*  ALBUMIN 3.0* 2.8* 2.8* 2.6* 2.6*   No results for input(s): LIPASE, AMYLASE in the last 168 hours. No results for input(s): AMMONIA in the last 168 hours. CBC: Recent Labs  Lab 06/24/21 0402 06/25/21 0325 06/26/21 0410 06/27/21 0340 06/28/21 0422 06/29/21 0450  WBC 3.0* 3.3* 4.9 4.3 3.3* 4.0  NEUTROABS 1.3* 1.5* 2.8 2.1 1.5*  --   HGB 7.8* 7.6* 8.5* 8.0* 8.3* 8.3*  HCT 24.0* 23.6* 25.9* 24.3* 24.7* 25.8*  MCV 95.6 93.7 91.5 90.3 90.8 93.5  PLT 154 168 205 185 158 198   Cardiac Enzymes: No results for input(s): CKTOTAL, CKMB, CKMBINDEX, TROPONINI in the last 168 hours. BNP: BNP (last 3 results) Recent Labs    05/06/21 2010  BNP 97.4    ProBNP (last 3 results) No results for input(s): PROBNP in the last 8760 hours.  CBG: Recent Labs  Lab 06/30/21 0019 06/30/21 0617 06/30/21 0722 06/30/21 1127 06/30/21 1612  GLUCAP 109* 97 112* 107* 123*       Signed:  Irine Seal MD.  [facility] 06/30/2021, 4:22 PM

## 2021-06-30 NOTE — Progress Notes (Signed)
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   Recent Labs    06/28/21 0422 06/29/21 0450 06/30/21 0350  NA 138 140 139  K 3.3* 3.5 3.5  CL 105 108 111  CO2 27 25 26   GLUCOSE 96 112* 109*  BUN 13 20 26*  CREATININE 1.29* 1.68* 1.56*  CALCIUM 9.4 9.4 9.3  PHOS 2.9 4.2 4.3  MG 2.0 1.9 1.8  ALBUMIN 2.8* 2.6* 2.6*  ALKPHOS 131* 123 112  AST 12* 12* 11*  ALT 31 26 24   BILITOT 0.7 0.7 0.5  TRIG 85  --   --   PREALBUMIN 24.2  --   --       Assessment: 60 year old female on chronic TPN followed by [facility] for short gut syndrome admitted for sepsis from unknown source (possible bacteremia).  Patient is on cyclic TPN over 12 hours at home.  Of note, previous admission in June 2022- TPN was held for a short period of time due to elevated liver enzymes.  TPN started on 7/27 and d/ced on 7/28 d/t bacteremia.Patient is now being treated with abx for acinetobacter bacteremia secondary to line infection.  ID team is ok with resuming TPN back on 06/28/21.  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, Crohn's s/p multiple SBR's, short gut syndrome, chronic diarrhea, anasarca, h/o diverticulosis, B12 deficiency, GERD  Glucose / Insulin: sSSI q6 - CBGs (goal <150): 97 -120; at goal Electrolytes: K stable at LLN, received additional 7mq KCL PO x 1 yesterday  Renal: Scr 1.56 improved Hepatic: all values below ULN (sl elevation in Alk Phos resolved) -  prealbumin 22.9 (7/27), 24.2 (8/2) - albumin 2.6 - TG 91 (7/27) Intake / Output; MIVF: NS at 10 ml/hr - I/O: +1.6 L GI Imaging: - 7/25 abd CT: Avascular necrosis of the left femoral head without acute fracture or cortical collapse GI Surgeries / Procedures: n/a Ordered GI Medications: Creon 36,000 units TID w/ meals,  Carafate 1g TID w/ meals and QHS, PPI, pepcid prn  Diet: Regular Oral nutritional supplements:  Central access: PICC line placed 04/03/21 TPN start date: on TPN PTA  Nutritional Goals (Dietician's recom on 8/2): Kcal:  1800-2000 kcal Protein:  90-100 grams Fluid:  >/= 2.2 L/day  Goal TPN rate is 100 mL/hr (provides 86 g of protein and 1852 kcals per day) - patient will attain goal protein amount with protein content from boost/ensure  PTA TPN Rx (see media tab for Rx image): total 1088 kCal (w/o Lipids) and 1688 kcal w/ lipids; 340 kcal AA, 748 kcal CHO -Formula w/o lipids 4 days/week- receives SMOFlipid 60g on MWF. Adds Infuvite 10 mL and Tralement 143mto each bag daily.  -Dextrose 70%= 220g/day; Plenamine 15% = 85g/day; Kphos 24 mmol/day; K acetate 57 mEq/day; Mg 10 mEq/day; CaGlu 10 mEq/day; Na Acetate 76 mEq/day - Na = 83 mEq/L  - K = 51 mEq/L - Ca= 5.5 mEq/L - Mag = 5.5 mEq/L - Phos = 13 mmol/L - Cl:Ac = 1:3  ++ Will dose inpatient TPN using dietitian's recom. for nutritional goals ++  Current Nutrition:  - TPN at 40 mL/hr - Prosource plus bid - 8/2: boost 1 container daily  and ensure 237 ml/ daily ordered - on regular diet    Plan:  At 1800:  - Increase TPN to 80 ml/hr at 1800 - Electrolytes in TPN: Na 3mq/L, K 536m/L, Ca 36m736mL, Mg 36mE79m, and Phos 136mm47m. Cl:Ac--> 1:1 - Add standard MVI and trace elements to TPN  - Continue sensitive q6h SSI and adjust as needed  - Continue MIVF KVO  - BMet in AM - Monitor TPN labs on Mon/Thurs and as needed  RandyClayburn PertrmD, BCPS [facility] Dept: [phone]  7:22 AM

## 2021-06-30 NOTE — TOC Transition Note (Addendum)
Transition of Care [facility]) - CM/SW Discharge Note   Patient Details  Name: Caitlin Peters MRN: [id number] Date of Birth: 1961/06/28  [facility] CM/SW Contact:  Ross Ludwig, LCSW Phone Number: 06/30/2021, 1:08 PM   Clinical Narrative:     Patient will be going home with home health through [facility].  CSW signing off please reconsult with any other social work needs, home health agency has been notified of planned discharge.  Patient is currently receiving HH and TPN.  Patient to continue with [facility] and [facility] for TPN.      Final next level of care: [place] Barriers to Discharge: Barriers Resolved   Patient Goals and CMS Choice Patient states their goals for this hospitalization and ongoing recovery are:: To return back home with home health RN and TPN. CMS Medicare.gov Compare Post Acute Care list provided to:: Patient Choice offered to / list presented to : Patient  Discharge Placement  Home                     Discharge Plan and Services   Discharge Planning Services: CM Consult Post Acute Care Choice: Home Health                    HH Arranged: RN [facility] Agency: Other - See comment [facility]) Date HH Agency Contacted: 06/30/21 Time [place]: 1308 Representative spoke with at [place]: [place] ([place]) Interventions     Readmission Risk Interventions Readmission Risk Prevention Plan 06/22/2021 02/10/2021 12/21/2020  Transportation Screening Complete Complete Complete  PCP or Specialist Appt within 3-5 Days - - -  Not Complete comments - - -  [place] or [place] Work Consult for [place] Planning/Counseling - - -  SW consult not completed comments - - -  Palliative Care Screening - - -  Medication Review Press photographer) Complete Complete -  PCP or Specialist appointment within 3-5 days of discharge Complete - Complete  HRI or Home Care  Consult Complete Complete Complete  SW Recovery Care/Counseling Consult Complete Complete Complete  Palliative Care Screening Not Applicable Not Applicable -  [place] Not Applicable Not Applicable -

## 2021-06-30 NOTE — Progress Notes (Signed)
Pt to be discharged to home this afternoon. All discharge instructions including all Discharge Medications and the schedules for these Medications reviewed with the Pt. Pt verbalized understanding of all discharge teaching. Discharge AVS with the Pt at time of discharge

## 2021-06-30 NOTE — Telephone Encounter (Signed)
Patient returning call and stated she is in the hospital pt aware of appt.on 8/10

## 2021-07-03 ENCOUNTER — Telehealth: Payer: Self-pay

## 2021-07-03 NOTE — Telephone Encounter (Signed)
Transition Care Management Unsuccessful Follow-up Telephone Call  Date of discharge and from where:  [place] 06/30/2021  Attempts:  2nd Attempt  Reason for unsuccessful TCM follow-up call:  Unable to leave message

## 2021-07-04 ENCOUNTER — Ambulatory Visit: Payer: Medicare Other | Admitting: Internal Medicine

## 2021-07-04 ENCOUNTER — Other Ambulatory Visit: Payer: Self-pay

## 2021-07-05 ENCOUNTER — Other Ambulatory Visit: Payer: Self-pay

## 2021-07-05 ENCOUNTER — Encounter (HOSPITAL_COMMUNITY): Payer: Self-pay

## 2021-07-05 ENCOUNTER — Emergency Department (HOSPITAL_COMMUNITY): Payer: Medicare Other

## 2021-07-05 ENCOUNTER — Inpatient Hospital Stay (HOSPITAL_COMMUNITY)
Admission: EM | Admit: 2021-07-05 | Discharge: 2021-07-14 | DRG: 314 | Disposition: A | Discharge status: Home Health | Payer: Medicare Other | Attending: Family Medicine | Admitting: Family Medicine

## 2021-07-05 ENCOUNTER — Ambulatory Visit: Payer: Medicare Other | Admitting: Internal Medicine

## 2021-07-05 DIAGNOSIS — R509 Fever, unspecified: Secondary | ICD-10-CM | POA: Diagnosis not present

## 2021-07-05 DIAGNOSIS — T80219A Unspecified infection due to central venous catheter, initial encounter: Secondary | ICD-10-CM

## 2021-07-05 DIAGNOSIS — E440 Moderate protein-calorie malnutrition: Secondary | ICD-10-CM | POA: Diagnosis present

## 2021-07-05 DIAGNOSIS — K912 Postsurgical malabsorption, not elsewhere classified: Secondary | ICD-10-CM | POA: Diagnosis present

## 2021-07-05 DIAGNOSIS — R748 Abnormal levels of other serum enzymes: Secondary | ICD-10-CM | POA: Diagnosis not present

## 2021-07-05 DIAGNOSIS — B377 Candidal sepsis: Secondary | ICD-10-CM | POA: Diagnosis present

## 2021-07-05 DIAGNOSIS — Z888 Allergy status to other drugs, medicaments and biological substances status: Secondary | ICD-10-CM

## 2021-07-05 DIAGNOSIS — K7200 Acute and subacute hepatic failure without coma: Secondary | ICD-10-CM

## 2021-07-05 DIAGNOSIS — R6520 Severe sepsis without septic shock: Secondary | ICD-10-CM | POA: Diagnosis present

## 2021-07-05 DIAGNOSIS — Z881 Allergy status to other antibiotic agents status: Secondary | ICD-10-CM | POA: Diagnosis not present

## 2021-07-05 DIAGNOSIS — M4620 Osteomyelitis of vertebra, site unspecified: Secondary | ICD-10-CM

## 2021-07-05 DIAGNOSIS — F32A Depression, unspecified: Secondary | ICD-10-CM | POA: Diagnosis present

## 2021-07-05 DIAGNOSIS — T80211A Bloodstream infection due to central venous catheter, initial encounter: Principal | ICD-10-CM | POA: Diagnosis present

## 2021-07-05 DIAGNOSIS — D631 Anemia in chronic kidney disease: Secondary | ICD-10-CM | POA: Diagnosis present

## 2021-07-05 DIAGNOSIS — T80211D Bloodstream infection due to central venous catheter, subsequent encounter: Secondary | ICD-10-CM | POA: Diagnosis not present

## 2021-07-05 DIAGNOSIS — N1831 Chronic kidney disease, stage 3a: Secondary | ICD-10-CM | POA: Diagnosis present

## 2021-07-05 DIAGNOSIS — N179 Acute kidney failure, unspecified: Secondary | ICD-10-CM | POA: Diagnosis present

## 2021-07-05 DIAGNOSIS — M810 Age-related osteoporosis without current pathological fracture: Secondary | ICD-10-CM | POA: Diagnosis present

## 2021-07-05 DIAGNOSIS — N183 Chronic kidney disease, stage 3 unspecified: Secondary | ICD-10-CM | POA: Diagnosis present

## 2021-07-05 DIAGNOSIS — E871 Hypo-osmolality and hyponatremia: Secondary | ICD-10-CM | POA: Diagnosis present

## 2021-07-05 DIAGNOSIS — I959 Hypotension, unspecified: Secondary | ICD-10-CM | POA: Diagnosis present

## 2021-07-05 DIAGNOSIS — Z79899 Other long term (current) drug therapy: Secondary | ICD-10-CM

## 2021-07-05 DIAGNOSIS — G894 Chronic pain syndrome: Secondary | ICD-10-CM | POA: Diagnosis present

## 2021-07-05 DIAGNOSIS — R7401 Elevation of levels of liver transaminase levels: Secondary | ICD-10-CM | POA: Diagnosis present

## 2021-07-05 DIAGNOSIS — Z87891 Personal history of nicotine dependence: Secondary | ICD-10-CM

## 2021-07-05 DIAGNOSIS — D696 Thrombocytopenia, unspecified: Secondary | ICD-10-CM | POA: Diagnosis present

## 2021-07-05 DIAGNOSIS — Z789 Other specified health status: Secondary | ICD-10-CM | POA: Diagnosis not present

## 2021-07-05 DIAGNOSIS — E876 Hypokalemia: Secondary | ICD-10-CM | POA: Diagnosis present

## 2021-07-05 DIAGNOSIS — K5080 Crohn's disease of both small and large intestine without complications: Secondary | ICD-10-CM | POA: Diagnosis present

## 2021-07-05 DIAGNOSIS — G40909 Epilepsy, unspecified, not intractable, without status epilepticus: Secondary | ICD-10-CM | POA: Diagnosis present

## 2021-07-05 DIAGNOSIS — I129 Hypertensive chronic kidney disease with stage 1 through stage 4 chronic kidney disease, or unspecified chronic kidney disease: Secondary | ICD-10-CM | POA: Diagnosis present

## 2021-07-05 DIAGNOSIS — R7881 Bacteremia: Secondary | ICD-10-CM | POA: Diagnosis not present

## 2021-07-05 DIAGNOSIS — D61818 Other pancytopenia: Secondary | ICD-10-CM | POA: Diagnosis present

## 2021-07-05 DIAGNOSIS — M25512 Pain in left shoulder: Secondary | ICD-10-CM | POA: Diagnosis present

## 2021-07-05 DIAGNOSIS — T80219D Unspecified infection due to central venous catheter, subsequent encounter: Secondary | ICD-10-CM | POA: Diagnosis not present

## 2021-07-05 DIAGNOSIS — K50818 Crohn's disease of both small and large intestine with other complication: Secondary | ICD-10-CM | POA: Diagnosis present

## 2021-07-05 DIAGNOSIS — Z8249 Family history of ischemic heart disease and other diseases of the circulatory system: Secondary | ICD-10-CM

## 2021-07-05 DIAGNOSIS — D539 Nutritional anemia, unspecified: Secondary | ICD-10-CM | POA: Diagnosis present

## 2021-07-05 DIAGNOSIS — A419 Sepsis, unspecified organism: Secondary | ICD-10-CM | POA: Diagnosis present

## 2021-07-05 DIAGNOSIS — I38 Endocarditis, valve unspecified: Secondary | ICD-10-CM | POA: Diagnosis not present

## 2021-07-05 DIAGNOSIS — T827XXA Infection and inflammatory reaction due to other cardiac and vascular devices, implants and grafts, initial encounter: Secondary | ICD-10-CM | POA: Diagnosis present

## 2021-07-05 DIAGNOSIS — R109 Unspecified abdominal pain: Secondary | ICD-10-CM

## 2021-07-05 DIAGNOSIS — Z885 Allergy status to narcotic agent status: Secondary | ICD-10-CM

## 2021-07-05 DIAGNOSIS — Z20822 Contact with and (suspected) exposure to covid-19: Secondary | ICD-10-CM | POA: Diagnosis present

## 2021-07-05 DIAGNOSIS — T827XXD Infection and inflammatory reaction due to other cardiac and vascular devices, implants and grafts, subsequent encounter: Secondary | ICD-10-CM | POA: Diagnosis not present

## 2021-07-05 DIAGNOSIS — R7989 Other specified abnormal findings of blood chemistry: Secondary | ICD-10-CM | POA: Diagnosis not present

## 2021-07-05 DIAGNOSIS — K219 Gastro-esophageal reflux disease without esophagitis: Secondary | ICD-10-CM | POA: Diagnosis present

## 2021-07-05 DIAGNOSIS — Y848 Other medical procedures as the cause of abnormal reaction of the patient, or of later complication, without mention of misadventure at the time of the procedure: Secondary | ICD-10-CM | POA: Diagnosis present

## 2021-07-05 DIAGNOSIS — M7989 Other specified soft tissue disorders: Secondary | ICD-10-CM | POA: Diagnosis not present

## 2021-07-05 DIAGNOSIS — Z79818 Long term (current) use of other agents affecting estrogen receptors and estrogen levels: Secondary | ICD-10-CM

## 2021-07-05 DIAGNOSIS — Z681 Body mass index (BMI) 19 or less, adult: Secondary | ICD-10-CM

## 2021-07-05 DIAGNOSIS — N184 Chronic kidney disease, stage 4 (severe): Secondary | ICD-10-CM | POA: Diagnosis present

## 2021-07-05 LAB — CBC WITH DIFFERENTIAL/PLATELET
Abs Immature Granulocytes: 0.02 10*3/uL (ref 0.00–0.07)
Basophils Absolute: 0 10*3/uL (ref 0.0–0.1)
Basophils Relative: 0 %
Eosinophils Absolute: 0 10*3/uL (ref 0.0–0.5)
Eosinophils Relative: 0 %
HCT: 25.1 % — ABNORMAL LOW (ref 36.0–46.0)
Hemoglobin: 8.3 g/dL — ABNORMAL LOW (ref 12.0–15.0)
Immature Granulocytes: 1 %
Lymphocytes Relative: 14 %
Lymphs Abs: 0.4 10*3/uL — ABNORMAL LOW (ref 0.7–4.0)
MCH: 30.4 pg (ref 26.0–34.0)
MCHC: 33.1 g/dL (ref 30.0–36.0)
MCV: 91.9 fL (ref 80.0–100.0)
Monocytes Absolute: 0.2 10*3/uL (ref 0.1–1.0)
Monocytes Relative: 8 %
Neutro Abs: 2.1 10*3/uL (ref 1.7–7.7)
Neutrophils Relative %: 77 %
Platelets: 137 10*3/uL — ABNORMAL LOW (ref 150–400)
RBC: 2.73 MIL/uL — ABNORMAL LOW (ref 3.87–5.11)
RDW: 14.2 % (ref 11.5–15.5)
WBC: 2.8 10*3/uL — ABNORMAL LOW (ref 4.0–10.5)
nRBC: 0 % (ref 0.0–0.2)

## 2021-07-05 LAB — LIPASE, BLOOD: Lipase: 38 U/L (ref 11–51)

## 2021-07-05 LAB — COMPREHENSIVE METABOLIC PANEL
ALT: 23 U/L (ref 0–44)
AST: 23 U/L (ref 15–41)
Albumin: 2.9 g/dL — ABNORMAL LOW (ref 3.5–5.0)
Alkaline Phosphatase: 92 U/L (ref 38–126)
Anion gap: 11 (ref 5–15)
BUN: 36 mg/dL — ABNORMAL HIGH (ref 6–20)
CO2: 27 mmol/L (ref 22–32)
Calcium: 9.3 mg/dL (ref 8.9–10.3)
Chloride: 100 mmol/L (ref 98–111)
Creatinine, Ser: 1.42 mg/dL — ABNORMAL HIGH (ref 0.44–1.00)
GFR, Estimated: 42 mL/min — ABNORMAL LOW (ref >60)
Glucose, Bld: 107 mg/dL — ABNORMAL HIGH (ref 70–99)
Potassium: 3.9 mmol/L (ref 3.5–5.1)
Sodium: 138 mmol/L (ref 135–145)
Total Bilirubin: 0.5 mg/dL (ref 0.3–1.2)
Total Protein: 6.2 g/dL — ABNORMAL LOW (ref 6.5–8.1)

## 2021-07-05 LAB — BLOOD GAS, VENOUS
Acid-Base Excess: 5 mmol/L — ABNORMAL HIGH (ref 0.0–2.0)
Bicarbonate: 29.6 mmol/L — ABNORMAL HIGH (ref 20.0–28.0)
FIO2: 21
O2 Saturation: 83.8 %
Patient temperature: 98.6
pCO2, Ven: 46.4 mmHg (ref 44.0–60.0)
pH, Ven: 7.421 (ref 7.250–7.430)
pO2, Ven: 51.6 mmHg — ABNORMAL HIGH (ref 32.0–45.0)

## 2021-07-05 LAB — TSH: TSH: 0.285 u[IU]/mL — ABNORMAL LOW (ref 0.350–4.500)

## 2021-07-05 LAB — SEDIMENTATION RATE: Sed Rate: 33 mm/hr — ABNORMAL HIGH (ref 0–22)

## 2021-07-05 LAB — LACTIC ACID, PLASMA
Lactic Acid, Venous: 0.8 mmol/L (ref 0.5–1.9)
Lactic Acid, Venous: 0.8 mmol/L (ref 0.5–1.9)

## 2021-07-05 LAB — RESP PANEL BY RT-PCR (FLU A&B, COVID) ARPGX2
Influenza A by PCR: NEGATIVE
Influenza B by PCR: NEGATIVE
SARS Coronavirus 2 by RT PCR: NEGATIVE

## 2021-07-05 LAB — C-REACTIVE PROTEIN: CRP: 2.8 mg/dL — ABNORMAL HIGH (ref <1.0)

## 2021-07-05 MED ORDER — HYDROMORPHONE HCL 2 MG PO TABS
4.0000 mg | ORAL_TABLET | Freq: Four times a day (QID) | ORAL | Status: DC | PRN
  Administered 2021-07-05 – 2021-07-06 (×3): 4 mg via ORAL
  Filled 2021-07-05 (×3): qty 2

## 2021-07-05 MED ORDER — SODIUM CHLORIDE 0.9 % IV BOLUS
500.0000 mL | Freq: Once | INTRAVENOUS | Status: AC
  Administered 2021-07-05: 500 mL via INTRAVENOUS

## 2021-07-05 MED ORDER — SODIUM CHLORIDE 0.9 % IV SOLN
1.0000 g | Freq: Two times a day (BID) | INTRAVENOUS | Status: DC
  Administered 2021-07-05 – 2021-07-10 (×10): 1 g via INTRAVENOUS
  Filled 2021-07-05 (×11): qty 1

## 2021-07-05 MED ORDER — ONDANSETRON HCL 4 MG/2ML IJ SOLN
4.0000 mg | Freq: Once | INTRAMUSCULAR | Status: AC
  Administered 2021-07-05: 4 mg via INTRAVENOUS
  Filled 2021-07-05: qty 2

## 2021-07-05 MED ORDER — DULOXETINE HCL 60 MG PO CPEP
60.0000 mg | ORAL_CAPSULE | Freq: Two times a day (BID) | ORAL | Status: DC
  Administered 2021-07-05 – 2021-07-09 (×7): 60 mg via ORAL
  Filled 2021-07-05 (×8): qty 1

## 2021-07-05 MED ORDER — LEVETIRACETAM 500 MG PO TABS
500.0000 mg | ORAL_TABLET | Freq: Two times a day (BID) | ORAL | Status: DC
  Administered 2021-07-05 – 2021-07-14 (×16): 500 mg via ORAL
  Filled 2021-07-05 (×18): qty 1

## 2021-07-05 MED ORDER — METHADONE HCL 5 MG PO TABS
5.0000 mg | ORAL_TABLET | Freq: Every day | ORAL | Status: DC
  Administered 2021-07-05 – 2021-07-09 (×15): 5 mg via ORAL
  Filled 2021-07-05 (×17): qty 1

## 2021-07-05 MED ORDER — SODIUM CHLORIDE 0.9% FLUSH: 10.0000 mL | INTRAVENOUS | Status: DC | PRN

## 2021-07-05 MED ORDER — HYDROMORPHONE HCL 1 MG/ML IJ SOLN
0.5000 mg | Freq: Once | INTRAMUSCULAR | Status: AC
  Administered 2021-07-05: 0.5 mg via INTRAVENOUS
  Filled 2021-07-05: qty 1

## 2021-07-05 MED ORDER — BUPROPION HCL ER (SR) 100 MG PO TB12
100.0000 mg | ORAL_TABLET | Freq: Two times a day (BID) | ORAL | Status: DC
  Administered 2021-07-05 – 2021-07-09 (×7): 100 mg via ORAL
  Filled 2021-07-05 (×9): qty 1

## 2021-07-05 MED ORDER — PANTOPRAZOLE SODIUM 40 MG PO TBEC
40.0000 mg | DELAYED_RELEASE_TABLET | Freq: Every day | ORAL | Status: DC
  Administered 2021-07-06 – 2021-07-14 (×9): 40 mg via ORAL
  Filled 2021-07-05 (×9): qty 1

## 2021-07-05 MED ORDER — AMLODIPINE BESYLATE 10 MG PO TABS
10.0000 mg | ORAL_TABLET | Freq: Every day | ORAL | Status: DC
  Administered 2021-07-06 – 2021-07-14 (×9): 10 mg via ORAL
  Filled 2021-07-05 (×9): qty 1

## 2021-07-05 MED ORDER — IOHEXOL 350 MG/ML SOLN
100.0000 mL | Freq: Once | INTRAVENOUS | Status: AC | PRN
  Administered 2021-07-05: 60 mL via INTRAVENOUS

## 2021-07-05 MED ORDER — VANCOMYCIN HCL 1250 MG/250ML IV SOLN
1250.0000 mg | Freq: Once | INTRAVENOUS | Status: AC
  Administered 2021-07-05: 1250 mg via INTRAVENOUS
  Filled 2021-07-05: qty 250

## 2021-07-05 MED ORDER — HYDRALAZINE HCL 50 MG PO TABS
100.0000 mg | ORAL_TABLET | Freq: Three times a day (TID) | ORAL | Status: DC
  Administered 2021-07-05 – 2021-07-14 (×27): 100 mg via ORAL
  Filled 2021-07-05 (×28): qty 2

## 2021-07-05 MED ORDER — PROMETHAZINE HCL 25 MG PO TABS
25.0000 mg | ORAL_TABLET | Freq: Four times a day (QID) | ORAL | Status: DC | PRN
  Administered 2021-07-08: 25 mg via ORAL
  Filled 2021-07-05: qty 1

## 2021-07-05 MED ORDER — CHLORHEXIDINE GLUCONATE CLOTH 2 % EX PADS
6.0000 | MEDICATED_PAD | Freq: Every day | CUTANEOUS | Status: DC
  Administered 2021-07-06 – 2021-07-13 (×9): 6 via TOPICAL

## 2021-07-05 MED ORDER — CARVEDILOL 12.5 MG PO TABS
25.0000 mg | ORAL_TABLET | Freq: Two times a day (BID) | ORAL | Status: DC
  Administered 2021-07-06 – 2021-07-11 (×11): 25 mg via ORAL
  Filled 2021-07-05 (×6): qty 1
  Filled 2021-07-05: qty 2
  Filled 2021-07-05 (×4): qty 1

## 2021-07-05 MED ORDER — VITAMIN B-12 1000 MCG PO TABS
1000.0000 ug | ORAL_TABLET | Freq: Every day | ORAL | Status: DC
  Administered 2021-07-06 – 2021-07-14 (×9): 1000 ug via ORAL
  Filled 2021-07-05 (×9): qty 1

## 2021-07-05 MED ORDER — VANCOMYCIN HCL 750 MG/150ML IV SOLN: 750.0000 mg | INTRAVENOUS | Status: DC

## 2021-07-05 MED ORDER — ENOXAPARIN SODIUM 40 MG/0.4ML IJ SOSY
40.0000 mg | PREFILLED_SYRINGE | INTRAMUSCULAR | Status: DC
  Administered 2021-07-05 – 2021-07-06 (×2): 40 mg via SUBCUTANEOUS
  Filled 2021-07-05 (×2): qty 0.4

## 2021-07-05 NOTE — ED Notes (Signed)
Pt states that she doesn't have much in the way of veins, blood cultures drawn via 23 gauge butterfly from R forearm and Lhand, pt tolerated well, pt requests that I use her picc line and don't place and iv, md ok with picc line use, picc line dressing appears clean and intact, picc line has positive blood return from both ports and flush without pain, pt states that she uses her picc line on a regular basis.

## 2021-07-05 NOTE — ED Notes (Signed)
Pt in bed, pt has picc line in R upper leg, dressing in clean and intact, pt c/o general fatigue and abd pain, states that she has chronic diarrhea, abd soft with bowel sounds, generally tender.

## 2021-07-05 NOTE — Progress Notes (Signed)
Pharmacy Antibiotic Note  Caitlin Peters is a 60 y.o. female admitted on 07/05/2021 with  fever, abdominal pain .  Pharmacy has been consulted for vancomycin dosing.  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 Broad spectrum antibiotics being initiated on admission.   Plan: Vancomycin 1250 mg IV loading dose followed by 750 mg IV q24h for estimated AUC of 550 Goal AUC 400-550. Check vancomycin levels at steady state as needed Meropenem adjusted to 1 g IV q12h for renal function Follow culture data, renal function  Height: 5' 8"  (172.7 cm) Weight: 54.4 kg (120 lb) IBW/kg (Calculated) : 63.9  Temp (24hrs), Avg:99.4 F (37.4 C), Min:98.7 F (37.1 C), Max:100.4 F (38 C)  Recent Labs  Lab 06/29/21 0450 06/30/21 0350 07/05/21 0924  WBC 4.0  --  2.8*  CREATININE 1.68* 1.56* 1.42*  LATICACIDVEN  --   --  0.8    Estimated Creatinine Clearance: 36.2 mL/min (A) (by C-G formula based on SCr of 1.42 mg/dL (H)).    Allergies  Allergen Reactions   Meperidine Hives    Other reaction(s): GI Upset Due to [place]    Hyoscyamine Hives and Swelling    Legs swelling Disorientation   Cefepime Other (See Comments)    Neurotoxicity occurring in setting of AKI. Ceftriaxone tolerated during same admit   Gabapentin Other (See Comments)    unknown   Lyrica [Pregabalin] Other (See Comments)    unknown   Topamax [Topiramate] Other (See Comments)    unknown   Zosyn [Piperacillin Sod-Tazobactam So] Other (See Comments)    Patient reports it makes her vomit, her neck stiff, and her "heart feel funny"   Fentanyl Rash    Pt is allergic to fentanyl patch related to the glue (gives her a rash) Pt states she is NOT allergic to fentanyl IV medicine   Morphine And Related Rash    Antimicrobials this admission: meropenem 8/10 >>  vancomycin 8/10 >>   Dose adjustments this  admission:  Microbiology results: 8/10 BCx:   Thank you for allowing pharmacy to be a part of this patient's care.  Lenis Noon, PharmD 07/05/2021 4:22 PM

## 2021-07-05 NOTE — Progress Notes (Signed)
R femoral PICC assessment: Site unremarkable. Transparent antimicrobial gel dressing clean dry and intact. Brisk blood return noted from both lumen. Pt on long term TPN at home. VAST will manage PICC line care while admitted.

## 2021-07-05 NOTE — ED Notes (Signed)
Pt in bed, pt states that her nausea is better/ gone and her pain is now an 8/10

## 2021-07-05 NOTE — ED Provider Notes (Signed)
Caitlin Peters   CSN: [id number] Arrival date & time: 07/05/21  0709     History Chief Complaint  Patient presents with   Abdominal Pain    Caitlin Peters is a 60 y.o. female.  Patient is a 60 year old female with past medical history of short gut syndrome receiving TPN, history of Crohn's disease, multiple abdominal surgeries, recent admission and discharged on 07/09/2021 for actinobacter bacteremia from PICC line presenting for complaints of abdominal pain.  Pt admits to generalized abdominal pain, nausea, vomiting, and subjective fevers. Admits to 5 pounds weight loss since discharge despite being on TPN and tolerating food orally.  Denies any chest pain, shortness of breath, or coughing.  Denies sick contacts.  Patient still currently receiving oral antibiotics for bacteremia. States PICC line was removed and replaced.   The history is provided by the patient. No language interpreter was used.  Abdominal Pain Pain location:  Generalized Pain quality: aching   Pain severity:  Moderate Associated symptoms: fever, nausea and vomiting   Associated symptoms: no chest pain, no chills, no cough, no dysuria, no hematuria, no shortness of breath and no sore throat       Past Medical History:  Diagnosis Date   Acute pancreatitis 04/13/2020   Anasarca 10/2019   AVN (avascular necrosis of bone) ([name])    Cataract    Choledocholithiasis (sludge) s/p ERCP 10/2019 10/21/2020   Chronic pain syndrome    CKD (chronic kidney disease), stage III ([place])    Crohn disease ([place])    Crohn's disease of small & large intestine with SGS 1984   Caitlin Peters is a 60 year old female with a history of Crohn's disease diagnosed in 70 (age 60), history of long-term steroid use with osteoporosis, S/P multiple bowel resections ([id number]) complicated by chronic back and abdominal pain, steatorrhea and short bowel syndrome. The patient has been left with ~120  cm small bowel attached to proximal transverse colon through rectum. She has been   Depression    Diverticulosis    GERD (gastroesophageal reflux disease)    HTN (hypertension)    IDA (iron deficiency anemia)    Malnutrition ([place])    Mass in chest    Osteoporosis    Osteoporosis 12/24/2014   Pancreatitis    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; 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; jejunal to transverse colon anastomosis Treated at [place] SB lengthening to 165cm Dr Alene Mires, [place] deficiency     Patient Active Problem List   Diagnosis Date Noted   Transaminitis    Hypophosphatemia    Chronic idiopathic constipation    Left shoulder pain 05/23/2021   Elevated LFTs    Sepsis ([place]) 05/06/2021   Acidosis 04/08/2021   Colitis 04/07/2021   Intractable nausea and vomiting 03/11/2021   Thoracic discitis 02/10/2021   Pressure injury of skin 12/18/2020   Bacteremia 12/17/2020   Seizures ([place]) 12/17/2020   Drug-seeking behavior 12/16/2020   Seizure ([address]) 12/15/2020   CKD (chronic kidney disease) stage 3, GFR 30-59 ml/min ([place]) 12/06/2020   COVID-19 virus infection 12/06/2020   Protein calorie malnutrition ([place]) 12/06/2020   Palpitations 11/22/2020   PAC (premature atrial contraction) 11/22/2020   History of central line-associated bloodstream infection ([facility]) 10/21/2020   Nausea & vomiting 10/21/2020   Choledocholithiasis (sludge) s/p ERCP 10/2019 10/21/2020  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   Abdominal pain 68/34/1962   Acute metabolic encephalopathy    Hypomagnesemia    Partial small bowel obstruction ([name])    Bacteremia associated with intravascular line ([place]) 08/04/2020   Enterobacter sepsis ([place]) 07/22/2020   Fever of unknown origin 06/27/2020   Anxiety 06/03/2020   Acute pancreatitis 04/13/2020   Infection due to Acinetobacter species  03/10/2020   Pancytopenia ([place]) 03/05/2020   Central line infection    Elevated liver enzymes 01/02/2020   Cholangitis    Anasarca 10/28/2019   Acute kidney injury superimposed on chronic kidney disease ([place]) 10/28/2019   Falls 10/28/2019   Malnutrition ([employer])    Vitamin B12 deficiency    GERD (gastroesophageal reflux disease)    Chronic pain syndrome    Hypokalemia due to excessive gastrointestinal loss of potassium 10/13/2019   Fever    Hypokalemia 10/12/2019   Chronic diarrhea 10/12/2019   Dysuria 10/12/2019   Bilateral lower extremity edema 10/12/2019   Hypertension 10/12/2019   AKI (acute kidney injury) ([place]) 10/12/2019   Fungemia 08/27/2019   IDA (iron deficiency anemia) 11/03/2018   Dilation of biliary tract 08/28/2018   Severe diarrhea 03/07/2018   LFTs abnormal 01/27/2018   GI tract obstruction ([place]) 01/27/2018   Gram-negative bacteremia 09/13/2017   HTN (hypertension), benign 12/02/2016   Intractable pain 12/02/2016   Anemia 12/02/2016   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   Avascular necrosis ([place]) 06/14/2015   Polyarthralgia 05/03/2015   On total parenteral nutrition (TPN) 12/30/2014   Osteoporosis 12/24/2014   Low back pain 12/16/2014   Vitamin D deficiency 12/16/2014   Short gut syndrome 07/15/2014   History of colonic diverticulitis 2014   Depression 07/24/2012   Gram-positive bacteremia 07/24/2012   Small bowel obstruction due to adhesions ([place]) 07/24/2012   Diarrhea 12/13/2011   Neuralgia and neuritis 06/01/2011   Myalgia and myositis 08/12/2003   Crohn's disease of both small and large intestine with other complication ([place]) 2297    Past Surgical History:  Procedure Laterality Date   ABDOMINAL ADHESION SURGERY  01/22/2018   APPENDECTOMY  1989   BILIARY DILATION  11/26/2019   Procedure: BILIARY DILATION;  Surgeon: Jackquline Denmark, MD;  Location: [facility] ENDOSCOPY;  Service: Endoscopy;;   BILIARY DILATION  03/08/2020   Procedure: BILIARY DILATION;  Surgeon:  Irving Copas., MD;  Location: [facility];  Service: Gastroenterology;;   BIOPSY  03/08/2020   Procedure: BIOPSY;  Surgeon: Irving Copas., MD;  Location: [facility] ENDOSCOPY;  Service: Gastroenterology;;   CHEST WALL RESECTION     right thoracotomy,resection of chest mass with anterior rib and reconstruction using prosthetic mesh and video arthroscopy   CHOLECYSTECTOMY  01/22/2018   COLONOSCOPY  2019   ENTEROSTOMY CLOSURE  04/1999   ERCP N/A 11/26/2019   Procedure: ENDOSCOPIC RETROGRADE CHOLANGIOPANCREATOGRAPHY (ERCP);  Surgeon: Jackquline Denmark, MD;  Location: [facility];  Service: Endoscopy;  Laterality: N/A;   ERCP N/A 03/08/2020   Procedure: ENDOSCOPIC RETROGRADE CHOLANGIOPANCREATOGRAPHY (ERCP);  Surgeon: Irving Copas., MD;  Location: [facility];  Service: Gastroenterology;  Laterality: N/A;   ESOPHAGOGASTRODUODENOSCOPY N/A 03/08/2020   Procedure: ESOPHAGOGASTRODUODENOSCOPY (EGD);  Surgeon: Irving Copas., MD;  Location: [facility];  Service: Gastroenterology;  Laterality: N/A;   EUS N/A 03/08/2020   Procedure: UPPER ENDOSCOPIC ULTRASOUND (EUS) LINEAR;  Surgeon: Irving Copas., MD;  Location: [facility];  Service: Gastroenterology;  Laterality: N/A;   ILEOCECETOMY  03/1999   ileocolon resection with abdominal stoma   ILEOSTOMY CLOSURE  2001   IR  FLUORO GUIDE CV LINE LEFT  01/07/2020   IR FLUORO GUIDE CV LINE LEFT  03/09/2020   IR FLUORO GUIDE CV LINE LEFT  05/09/2020   IR FLUORO GUIDE CV LINE LEFT  07/20/2020   IR FLUORO GUIDE CV LINE RIGHT  08/05/2020   IR FLUORO GUIDE CV LINE RIGHT  04/03/2021   IR PTA VENOUS EXCEPT DIALYSIS CIRCUIT  01/07/2020   IR REMOVAL TUN CV CATH W/O FL  08/05/2020   IR US GUIDE VASC ACCESS LEFT     x 2 06/17/19 and 09/14/2019   IR US GUIDE VASC ACCESS RIGHT  08/05/2020   KNEE SURGERY     right knee    LAPAROSCOPIC SMALL BOWEL RESECTION  2009   2000-2009.  SB resections for Crohns Disease - now with Short gut   OMENTECTOMY   01/22/2018   PARTIAL HYSTERECTOMY  1984   with LSO   REMOVAL OF STONES  11/26/2019   Procedure: REMOVAL OF STONES;  Surgeon: Jackquline Denmark, MD;  Location: [facility] ENDOSCOPY;  Service: Endoscopy;;   REMOVAL OF STONES  03/08/2020   Procedure: REMOVAL OF STONES;  Surgeon: Irving Copas., MD;  Location[facility] ENDOSCOPY;  Service: Gastroenterology;;   SALPINGOOPHORECTOMY Left 1984   SALPINGOOPHORECTOMY Right 1990   SERIAL TRANSVERSE ENTEROPLASTY (STEP) - SMALL BOWEL LENGTHENING  01/22/2018   Dr Alene Mires, [facility] - SB length from 120 to 165cm    SMALL INTESTINE SURGERY  2002   SMALL INTESTINE SURGERY  2003   SPHINCTEROTOMY  11/26/2019   Procedure: SPHINCTEROTOMY;  Surgeon: Jackquline Denmark, MD;  Location: [facility] ENDOSCOPY;  Service: Endoscopy;;   TOTAL ABDOMINAL HYSTERECTOMY  1990   with RSO   UPPER GASTROINTESTINAL ENDOSCOPY       OB History   No obstetric history on file.     Family History  Problem Relation Age of Onset   Seizures Mother    Glaucoma Mother    CAD Father    Heart disease Father    Hypertension Father    Breast cancer Sister    Multiple sclerosis Sister    Diabetes Sister    Lupus Sister    Colon cancer Other    Crohn's disease Other     Social History   Tobacco Use   Smoking status: Former   Smokeless tobacco: Never  Scientific laboratory technician Use: Never used  Substance Use Topics   Alcohol use: Not Currently   Drug use: Never    Home Medications Prior to Admission medications   Medication Sig Start Date End Date Taking? Authorizing Provider  acetaminophen (TYLENOL) 325 MG tablet Take 2 tablets (650 mg total) by mouth every 6 (six) hours as needed for mild pain. 07/07/21   Eugenie Filler, MD  acetaminophen (TYLENOL) 500 MG tablet Take 2 tablets (1,000 mg total) by mouth 3 (three) times daily for 7 days. 06/30/21 07/07/21  Eugenie Filler, MD  amLODipine (NORVASC) 10 MG tablet Take 10 mg by mouth daily.    [provider]  budesonide  (ENTOCORT EC) 3 MG 24 hr capsule Take 1 capsule (3 mg total) by mouth every 3 (three) days. 06/30/21   Eugenie Filler, MD  buPROPion [facility] SR) 100 MG 12 hr tablet Take 200 mg by mouth daily. 03/02/21   [provider]  Calcium 200 MG TABS Take 200 mg by mouth daily.    [provider]  carvedilol (COREG) 25 MG tablet Take 1 tablet (25 mg total) by mouth  2 (two) times daily with a meal. 05/16/21 06/20/22  Darliss Cheney, MD  cholecalciferol (VITAMIN D3) 25 MCG (1000 UT) tablet Take 1,000 Units by mouth daily.     [provider]  ciprofloxacin (CIPRO) 500 MG tablet Take 1 tablet (500 mg total) by mouth 2 (two) times daily for 9 days. 06/30/21 07/09/21  Eugenie Filler, MD  cycloSPORINE (RESTASIS) 0.05 % ophthalmic emulsion Place 1 drop into both eyes 2 (two) times daily.    [provider]  denosumab (PROLIA) 60 MG/ML SOSY injection Inject 60 mg into the skin every 6 (six) months.    [provider]  dexlansoprazole (DEXILANT) 60 MG capsule Take 1 capsule (60 mg total) by mouth daily. 12/28/20   [name], [name], MD  DULoxetine (CYMBALTA) 60 MG capsule Take 2 capsules (120 mg total) by mouth daily. 06/30/21   Eugenie Filler, MD  estradiol (ESTRACE) 2 MG tablet Take 1 tablet (2 mg total) by mouth daily. 12/07/20   [name], [name], MD  famotidine (PEPCID) 20 MG tablet Take 20 mg by mouth daily as needed for heartburn or indigestion.    [provider]  feeding supplement (ENSURE ENLIVE / ENSURE PLUS) LIQD Take 237 mLs by mouth daily. 06/30/21   Eugenie Filler, MD  GATTEX 5 MG KIT Inject 1.5 mg into the skin daily. 01/03/21   [provider]  hydrALAZINE (APRESOLINE) 100 MG tablet Take 1 tablet (100 mg total) by mouth 3 (three) times daily. 06/30/21   Eugenie Filler, MD  HYDROmorphone (DILAUDID) 4 MG tablet Take 4 mg by mouth in the morning, at noon, in the evening, and at bedtime. 04/18/21   [provider]   levETIRAcetam (KEPPRA) 500 MG tablet Take 500 mg by mouth 2 (two) times daily.    [provider]  lidocaine (LIDODERM) 5 % Place 1 patch onto the skin daily. Remove & Discard patch within 12 hours or as directed by MD 06/30/21   Eugenie Filler, MD  lipase/protease/amylase (CREON) 36000 UNITS CPEP capsule Take 1 capsule (36,000 Units total) by mouth See admin instructions. 36000 units with meals 06/30/21   Eugenie Filler, MD  Menthol, Topical Analgesic, (BIOFREEZE EX) Apply 1 application topically as needed (pain).    [provider]  methadone (DOLOPHINE) 5 MG tablet Take 1 tablet (5 mg total) by mouth 5 (five) times daily. 06/30/20   Florencia Reasons, MD  methocarbamol (ROBAXIN) 500 MG tablet Take 1 tablet (500 mg total) by mouth in the morning and at bedtime. 06/13/21   [name], [name], MD  Multiple Vitamins-Minerals (MULTIVITAMIN ADULT PO) Take 1 tablet by mouth daily.    [provider]  NARCAN 4 MG/0.1ML LIQD nasal spray kit Place 1 spray into the nose once as needed (overdose). 10/14/20   [provider]  Nutritional Supplements (,FEEDING SUPPLEMENT, PROSOURCE PLUS) liquid Take 30 mLs by mouth 2 (two) times daily between meals. 06/30/21   Eugenie Filler, MD  polyvinyl alcohol (LIQUIFILM TEARS) 1.4 % ophthalmic solution Place 1 drop into both eyes as needed for dry eyes.    [provider]  PRESCRIPTION MEDICATION Inject 1 each into the vein daily. Home TPN . [facility]  . 1 bag for 12 hours. [phone]    [provider]  Probiotic Product (PROBIOTIC-10 PO) Take 1 capsule by mouth daily.     [provider]  promethazine (PHENERGAN) 25 MG tablet Take 1  tablet (25 mg total) by mouth every 6 (six) hours as needed for nausea. 06/30/21   Eugenie Filler, MD  sodium chloride 0.9 % infusion Inject 1 mL into the vein daily as needed (flush). 10/16/20   [provider]  sucralfate (CARAFATE) 1  GM/10ML suspension Take 10 mLs (1 g total) by mouth 2 (two) times daily. 06/30/21   Eugenie Filler, MD  Trace Minerals Cu-Mn-Se-Zn (TRALEMENT IV) Inject 1 mL into the vein See admin instructions. Used in TPN bag 4 times weekly    [provider]  vitamin B-12 (CYANOCOBALAMIN) 1000 MCG tablet Take 1,000 mcg by mouth daily.     [provider]    Allergies    Meperidine, Hyoscyamine, Cefepime, Gabapentin, Lyrica [pregabalin], Topamax [topiramate], Zosyn [piperacillin sod-tazobactam so], Fentanyl, and Morphine and related  Review of Systems   Review of Systems  Constitutional:  Positive for fever and unexpected weight change. Negative for chills.  HENT:  Negative for ear pain and sore throat.   Eyes:  Negative for pain and visual disturbance.  Respiratory:  Negative for cough and shortness of breath.   Cardiovascular:  Negative for chest pain and palpitations.  Gastrointestinal:  Positive for abdominal pain, nausea and vomiting.  Genitourinary:  Negative for dysuria and hematuria.  Musculoskeletal:  Negative for arthralgias and back pain.  Skin:  Negative for color change and rash.  Neurological:  Negative for seizures and syncope.  All other systems reviewed and are negative.  Physical Exam Updated Vital Signs BP (!) 178/84 (BP Location: Right Arm)   Pulse 79   Temp 99.7 F (37.6 C) (Oral)   Resp 18   Ht 5' 8"  (1.727 m)   Wt 54.4 kg   SpO2 98%   BMI 18.25 kg/m   Physical Exam Vitals and nursing Peters reviewed.  Constitutional:      General: She is not in acute distress.    Appearance: She is well-developed.  HENT:     Head: Normocephalic and atraumatic.  Eyes:     Conjunctiva/sclera: Conjunctivae normal.  Cardiovascular:     Rate and Rhythm: Normal rate and regular rhythm.     Heart sounds: No murmur heard. Pulmonary:     Effort: Pulmonary effort is normal. No respiratory distress.     Breath sounds: Normal breath sounds.  Abdominal:     Palpations:  Abdomen is soft.     Tenderness: There is generalized abdominal tenderness.    Musculoskeletal:     Cervical back: Neck supple.  Skin:    General: Skin is warm and dry.       Neurological:     Mental Status: She is alert.    ED Results / Procedures / Treatments   Labs (all labs ordered are listed, but only abnormal results are displayed) Labs Reviewed  RESP PANEL BY RT-PCR (FLU A&B, COVID) ARPGX2  CULTURE, BLOOD (ROUTINE X 2)  CULTURE, BLOOD (ROUTINE X 2)  CBC WITH DIFFERENTIAL/PLATELET  COMPREHENSIVE METABOLIC PANEL  LIPASE, BLOOD  BLOOD GAS, VENOUS  LACTIC ACID, PLASMA  LACTIC ACID, PLASMA    EKG None  Radiology No results found.  Procedures Procedures   Medications Ordered in ED Medications  sodium chloride 0.9 % bolus 500 mL (has no administration in time range)  ondansetron (ZOFRAN) injection 4 mg (has no administration in time range)  HYDROmorphone (DILAUDID) injection 0.5 mg (has no administration in time range)    ED Course  I have reviewed the triage vital signs and  the nursing notes.  Pertinent labs & imaging results that were available during my care of the patient were reviewed by me and considered in my medical decision making (see chart for details).    MDM Rules/Calculators/A&P                          60 year old female with past medical history of short gut syndrome receiving TPN, history of Crohn's disease, multiple abdominal surgeries, recent admission and discharged on 07/09/2021 for actinobacter bacteremia at [facility] line presenting for complaints of abdominal pain, nausea, vomiting, and fevers.   Patient is alert and oriented x3, no acute distress, afebrile, stable vital signs.  Physical exam demonstrates generalized abdominal tenderness.  Laboratory studies demonstrate no leukocytosis.  No signs or symptoms of sepsis at this time.  CT abdomen demonstrates no acute process.  On reevaluation of patient and repeat vitals, patient developed  fever of 100.82F.  Given patient's recent history of hospitalization with actinobacter bacteremia, infectious disease consulted for antibiotic recommendations.  Patient currently still taking ciprofloxacin orally as discharged in hospital.  States her current PICC line is a new PICC line.  No signs of infection at the PICC line site at this time. I spoke with infectious disease specialist who recommends starting patient on vancomycin and meropenum empirically. Antibiotics ordered. Recommends discussion with IR to exchange right sided Val[place] line. Pt unable to have PICC holiday due to poor vascular access.  Infectious disease on consult Hospitalist agreeable to admission Patient agreeable to plan  Final Clinical Impression(s) / ED Diagnoses Final diagnoses:  Fever, unspecified fever cause  Infection of peripherally inserted central catheter (PICC) susupected    Rx / DC Orders ED Discharge Orders     None        Lianne Cure, DO 94/58/59 [id number]

## 2021-07-05 NOTE — ED Notes (Signed)
Report called to RN Iris, pt to be transported in 10 minutes.

## 2021-07-05 NOTE — ED Notes (Signed)
Pt in CT.

## 2021-07-05 NOTE — H&P (Addendum)
History and Physical    Caitlin Peters WHQ:[id number] DOB: 22-Jun-1961 DOA: 07/05/2021  PCP: [name], [name], MD   Patient coming from: home Chief Complaint: fever recurrence  HPI: Caitlin Peters is a 60 y.o. 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 She has not been feeling well.  She takes a lot of medicines.  She is lost 5 pounds since discharge despite being on TPN and tolerating some food orally.  Denies any chest pain, shortness of breath, or coughing.  Denies sick contacts.    On the floor I was called to bedside because she was having some seizure-like activity with confusion afterwards but when I went to see her she was alert and oriented appearing, possibly delirium, she takes keppra  In the ED, hypertensive especially when was uncomfortable an in distress, temperature of 100.4 once, wbc chronically low at 2.8, hgb 8.3, plt 137, na 138, K 3.9, SCr 1.42,  after talking with ID, started on meropenem and vanocomycin, given 523m bolus  On floor is in some distress from the pain.  Review of Systems: As per HPI otherwise 10 point review of systems negative.  Other pertinents as below:  General - has fevers and chills HEENT - denies any new headaches or stroke symptoms Cardio - denies any new cp or palpitations Resp - denies sob or cough GI - has abdominal pain, chronically, softer stools GU - denies new urinary concerns like dysuria or frequency MSK - has left shoulder pain Skin - no new  skin concernss, thinks picc site is doing well Neuro - deniesconfusion currently or stroke symptoms Psych - denies new anxiety or depression  Past Medical History:  Diagnosis Date   Acute pancreatitis 04/13/2020   Anasarca 10/2019   AVN (avascular necrosis of bone) [place]    Cataract    Choledocholithiasis (sludge) s/p ERCP 10/2019 10/21/2020   Chronic pain syndrome    CKD (chronic kidney disease), stage III ([place]    Crohn disease ([place]    Crohn's disease of small & large intestine with SGS 1984   Caitlin Peters a 60year old female with a history of Crohn's disease diagnosed in 146(age 60, history of long-term steroid use with osteoporosis, S/P multiple bowel resections ([id number] complicated by chronic back and abdominal pain, steatorrhea and short bowel syndrome. The patient has been left with ~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 attached to proximal transverse colon through rectum. She has been   Depression    Diverticulosis    GERD (gastroesophageal reflux disease)    HTN (hypertension)    IDA (iron deficiency anemia)    Malnutrition ([place]    Mass in chest    Osteoporosis    Osteoporosis 12/24/2014   Pancreatitis    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; 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; jejunal to transverse [facility]osis [facility]   Vitamin B12 deficiency  Past Surgical History:  Procedure Laterality Date   ABDOMINAL ADHESION SURGERY  01/22/2018   APPENDECTOMY  1989   BILIARY DILATION  11/26/2019   Procedure: BILIARY DILATION;  Surgeon: Jackquline Denmark, MD;  Location: [facility];  Service: Endoscopy;;   BILIARY DILATION  03/08/2020   Procedure: BILIARY DILATION;  Surgeon: Irving Copas., MD;  Location: [place];  Service: Gastroenterology;;   BIOPSY  03/08/2020   Procedure: BIOPSY;  Surgeon:  Irving Copas., MD;  Location: [place];  Service: Gastroenterology;;   CHEST WALL RESECTION     right thoracotomy,resection of chest mass with anterior rib and reconstruction using prosthetic mesh and video arthroscopy   CHOLECYSTECTOMY  01/22/2018   COLONOSCOPY  2019   ENTEROSTOMY CLOSURE  04/1999   ERCP N/A 11/26/2019   Procedure: ENDOSCOPIC RETROGRADE CHOLANGIOPANCREATOGRAPHY (ERCP);  Surgeon: Jackquline Denmark, MD;  Location: [facility];  Service: Endoscopy;  Laterality: N/A;   ERCP N/A 03/08/2020   Procedure: ENDOSCOPIC RETROGRADE CHOLANGIOPANCREATOGRAPHY (ERCP);  Surgeon: Irving Copas., MD;  Location: [place];  Service: Gastroenterology;  Laterality: N/A;   ESOPHAGOGASTRODUODENOSCOPY N/A 03/08/2020   Procedure: ESOPHAGOGASTRODUODENOSCOPY (EGD);  Surgeon: Irving Copas., MD;  Location: [place];  Service: Gastroenterology;  Laterality: N/A;   EUS N/A 03/08/2020   Procedure: UPPER ENDOSCOPIC ULTRASOUND (EUS) LINEAR;  Surgeon: Irving Copas., MD;  Location: [place];  Service: Gastroenterology;  Laterality: N/A;   ILEOCECETOMY  03/1999   ileocolon resection with abdominal stoma   ILEOSTOMY CLOSURE  2001   IR FLUORO GUIDE CV LINE LEFT  01/07/2020   IR FLUORO GUIDE CV LINE LEFT  03/09/2020   IR FLUORO GUIDE CV LINE LEFT  05/09/2020   IR FLUORO GUIDE CV LINE LEFT  07/20/2020   IR FLUORO GUIDE CV LINE RIGHT  08/05/2020   IR FLUORO GUIDE CV LINE RIGHT  04/03/2021   IR PTA VENOUS EXCEPT DIALYSIS CIRCUIT  01/07/2020   IR REMOVAL TUN CV CATH W/O FL  08/05/2020   IR US GUIDE VASC ACCESS LEFT     x 2 06/17/19 and 09/14/2019   IR US GUIDE VASC ACCESS RIGHT  08/05/2020   KNEE SURGERY     right knee    LAPAROSCOPIC SMALL BOWEL RESECTION  2009   2000-2009.  SB resections for Crohns Disease - now with Short gut   OMENTECTOMY  01/22/2018   PARTIAL HYSTERECTOMY  1984   with LSO   REMOVAL OF STONES  11/26/2019   Procedure: REMOVAL OF STONES;  Surgeon:  Jackquline Denmark, MD;  [facility];  Service: Endoscopy;;   REMOVAL OF STONES  03/08/2020   Procedure: REMOVAL OF STONES;  Surgeon: Irving Copas., MD;  Location[facility];  Service: Gastroenterology;;   SALPINGOOPHORECTOMY Left 1984   SALPINGOOPHORECTOMY Right 1990   SERIAL TRANSVERSE ENTEROPLASTY (STEP) - SMALL BOWEL LENGTHENING  01/22/2018   Dr Alene Mires, [facility] - SB length from 120 to 165cm    SMALL INTESTINE SURGERY  2002   SMALL INTESTINE SURGERY  2003   SPHINCTEROTOMY  11/26/2019   Procedure: SPHINCTEROTOMY;  Surgeon: Jackquline Denmark, MD;  [facility];  Service: Endoscopy;;   TOTAL ABDOMINAL HYSTERECTOMY  1990   with RSO   UPPER GASTROINTESTINAL ENDOSCOPY       reports that she has quit smoking. She has never used smokeless tobacco. She reports previous alcohol use. She reports that she does not use drugs.  Allergies  Allergen Reactions   Meperidine Hives    Other reaction(s): GI  Upset Due to [place]    Hyoscyamine Hives and Swelling    Legs swelling Disorientation   Cefepime Other (See Comments)    Neurotoxicity occurring in setting of AKI. Ceftriaxone tolerated during same admit   Gabapentin Other (See Comments)    unknown   Lyrica [Pregabalin] Other (See Comments)    unknown   Topamax [Topiramate] Other (See Comments)    unknown   Zosyn [Piperacillin Sod-Tazobactam So] Other (See Comments)    Patient reports it makes her vomit, her neck stiff, and her "heart feel funny"   Fentanyl Rash    Pt is allergic to fentanyl patch related to the glue (gives her a rash) Pt states she is NOT allergic to fentanyl IV medicine   Morphine And Related Rash    Family History  Problem Relation Age of Onset   Seizures Mother    Glaucoma Mother    CAD Father    Heart disease Father    Hypertension Father    Breast cancer Sister    Multiple sclerosis Sister    Diabetes Sister    Lupus Sister    Colon cancer Other    Crohn's disease  Other     Prior to Admission medications   Medication Sig Start Date End Date Taking? Authorizing Provider  acetaminophen (TYLENOL) 500 MG tablet Take 2 tablets (1,000 mg total) by mouth 3 (three) times daily for 7 days. 06/30/21 07/07/21 Yes Eugenie Filler, MD  amLODipine (NORVASC) 10 MG tablet Take 10 mg by mouth daily.   Yes [provider]  budesonide (ENTOCORT EC) 3 MG 24 hr capsule Take 1 capsule (3 mg total) by mouth every 3 (three) days. 06/30/21  Yes Eugenie Filler, MD  buPROPion [facility] SR) 100 MG 12 hr tablet Take 200 mg by mouth daily. 03/02/21  Yes [provider]  Calcium 200 MG TABS Take 200 mg by mouth daily.   Yes [provider]  carvedilol (COREG) 25 MG tablet Take 1 tablet (25 mg total) by mouth 2 (two) times daily with a meal. 05/16/21 06/20/22 Yes Pahwani, Einar Grad, MD  cholecalciferol (VITAMIN D3) 25 MCG (1000 UT) tablet Take 1,000 Units by mouth daily.    Yes [provider]  ciprofloxacin (CIPRO) 500 MG tablet Take 1 tablet (500 mg total) by mouth 2 (two) times daily for 9 days. 06/30/21 07/09/21 Yes Eugenie Filler, MD  cycloSPORINE (RESTASIS) 0.05 % ophthalmic emulsion Place 1 drop into both eyes 2 (two) times daily.   Yes [provider]  denosumab (PROLIA) 60 MG/ML SOSY injection Inject 60 mg into the skin every 6 (six) months.   Yes [provider]  dexlansoprazole (DEXILANT) 60 MG capsule Take 1 capsule (60 mg total) by mouth daily. 12/28/20  Yes [name], [name], MD  DULoxetine (CYMBALTA) 60 MG capsule Take 2 capsules (120 mg total) by mouth daily. 06/30/21  Yes Eugenie Filler, MD  estradiol (ESTRACE) 2 MG tablet Take 1 tablet (2 mg total) by mouth daily. 12/07/20  Yes [name], [name], MD  famotidine (PEPCID) 20 MG tablet Take 20 mg by mouth daily as needed for heartburn or indigestion.   Yes [provider]  feeding supplement (ENSURE ENLIVE / ENSURE PLUS) LIQD Take 237 mLs by mouth  daily. 06/30/21  Yes Eugenie Filler, MD  GATTEX 5 MG KIT Inject 1.5 mg into the skin daily. 01/03/21  Yes [provider]  hydrALAZINE (APRESOLINE) 100 MG tablet Take 1 tablet (100  mg total) by mouth 3 (three) times daily. 06/30/21  Yes Eugenie Filler, MD  HYDROmorphone (DILAUDID) 4 MG tablet Take 4 mg by mouth in the morning, at noon, in the evening, and at bedtime. 04/18/21  Yes [provider]  levETIRAcetam (KEPPRA) 500 MG tablet Take 500 mg by mouth 2 (two) times daily.   Yes [provider]  lidocaine (LIDODERM) 5 % Place 1 patch onto the skin daily. Remove & Discard patch within 12 hours or as directed by MD 06/30/21  Yes Eugenie Filler, MD  lipase/protease/amylase (CREON) 36000 UNITS CPEP capsule Take 1 capsule (36,000 Units total) by mouth See admin instructions. 36000 units with meals 06/30/21  Yes Eugenie Filler, MD  Menthol, Topical Analgesic, (BIOFREEZE EX) Apply 1 application topically as needed (pain).   Yes [provider]  methadone (DOLOPHINE) 5 MG tablet Take 1 tablet (5 mg total) by mouth 5 (five) times daily. 06/30/20  Yes Florencia Reasons, MD  methocarbamol (ROBAXIN) 500 MG tablet Take 1 tablet (500 mg total) by mouth in the morning and at bedtime. 06/13/21  Yes [name], [name], MD  Multiple Vitamins-Minerals (MULTIVITAMIN ADULT PO) Take 1 tablet by mouth daily.   Yes [provider]  NARCAN 4 MG/0.1ML LIQD nasal spray kit Place 1 spray into the nose once as needed (overdose). 10/14/20  Yes [provider]  Nutritional Supplements (,FEEDING SUPPLEMENT, PROSOURCE PLUS) liquid Take 30 mLs by mouth 2 (two) times daily between meals. 06/30/21  Yes Eugenie Filler, MD  polyvinyl alcohol (LIQUIFILM TEARS) 1.4 % ophthalmic solution Place 1 drop into both eyes as needed for dry eyes.   Yes [provider]  PRESCRIPTION MEDICATION Inject 1 each into the vein daily. Home TPN . [facility] . 1  bag for 12 hours. [id number]   Yes [provider]  Probiotic Product (PROBIOTIC-10 PO) Take 1 capsule by mouth daily.    Yes [provider]  promethazine (PHENERGAN) 25 MG tablet Take 1 tablet (25 mg total) by mouth every 6 (six) hours as needed for nausea. 06/30/21  Yes Eugenie Filler, MD  sodium chloride 0.9 % infusion Inject 1 mL into the vein daily as needed (flush). 10/16/20  Yes [provider]  sucralfate (CARAFATE) 1 GM/10ML suspension Take 10 mLs (1 g total) by mouth 2 (two) times daily. 06/30/21  Yes Eugenie Filler, MD  Trace Minerals Cu-Mn-Se-Zn (TRALEMENT IV) Inject 1 mL into the vein See admin instructions. Used in TPN bag 4 times weekly   Yes [provider]  vitamin B-12 (CYANOCOBALAMIN) 1000 MCG tablet Take 1,000 mcg by mouth daily.    Yes [provider]  acetaminophen (TYLENOL) 325 MG tablet Take 2 tablets (650 mg total) by mouth every 6 (six) hours as needed for mild pain. Patient not taking: Reported on 07/05/2021 07/07/21   Eugenie Filler, MD    Physical Exam: Vitals:   07/05/21 1410 07/05/21 1500 07/05/21 1907 07/05/21 2016  BP: (!) 186/93 (!) 197/90 (!) 178/87 (!) 173/89  Pulse: 79 79 84 76  Resp: 17 16 17 19   Temp: (!) 100.4 F (38 C)  100.2 F (37.9 C) 100.1 F (37.8 C)  TempSrc: Oral  Oral Oral  SpO2: 99% 97% 98% 99%  Weight:      Height:        Constitutional: in some distress, uncomfortabvle I think because of withdraws Eyes: pupils equal and reactive to light, anicteric,  without injection ENMT: MMM, throat without exudates or erythema Neck: normal, supple, no masses, no thyromegaly noted Respiratory: CTAB, nwob,  Cardiovascular: rrr w/o mrg, warm extremities Abdomen: NBS, NT,   Musculoskeletal: moving all 4 extremities, strength grossly intact 5/5 in the UE and LE's, DTR's Skin: no rashes, lesions, ulcers. No induration Neurologic: CN 2-12  intact. Sensation intact Psychiatric: AO appearing,  mentation appropriate   Labs on Admission: I have personally reviewed following labs and imaging studies  CBC: Recent Labs  Lab 06/29/21 0450 07/05/21 0924  WBC 4.0 2.8*  NEUTROABS  --  2.1  HGB 8.3* 8.3*  HCT 25.8* 25.1*  MCV 93.5 91.9  PLT 198 700*   Basic Metabolic Panel: Recent Labs  Lab 06/29/21 0450 06/30/21 0350 07/05/21 0924  NA 140 139 138  K 3.5 3.5 3.9  CL 108 111 100  CO2 25 26 27   GLUCOSE 112* 109* 107*  BUN 20 26* 36*  CREATININE 1.68* 1.56* 1.42*  CALCIUM 9.4 9.3 9.3  MG 1.9 1.8  --   PHOS 4.2 4.3  --    GFR: Estimated Creatinine Clearance: 36.2 mL/min (A) (by C-G formula based on SCr of 1.42 mg/dL (H)). Liver Function Tests: Recent Labs  Lab 06/29/21 0450 06/30/21 0350 07/05/21 0924  AST 12* 11* 23  ALT 26 24 23   ALKPHOS 123 112 92  BILITOT 0.7 0.5 0.5  PROT 6.1* 5.7* 6.2*  ALBUMIN 2.6* 2.6* 2.9*   Recent Labs  Lab 07/05/21 0924  LIPASE 38   No results for input(s): AMMONIA in the last 168 hours. Coagulation Profile: No results for input(s): INR, PROTIME in the last 168 hours. Cardiac Enzymes: No results for input(s): CKTOTAL, CKMB, CKMBINDEX, TROPONINI in the last 168 hours. BNP (last 3 results) No results for input(s): PROBNP in the last 8760 hours. HbA1C: No results for input(s): HGBA1C in the last 72 hours. CBG: Recent Labs  Lab 06/30/21 0019 06/30/21 0617 06/30/21 0722 06/30/21 1127 06/30/21 1612  GLUCAP 109* 97 112* 107* 123*   Lipid Profile: No results for input(s): CHOL, HDL, LDLCALC, TRIG, CHOLHDL, LDLDIRECT in the last 72 hours. Thyroid Function Tests: No results for input(s): TSH, T4TOTAL, FREET4, T3FREE, THYROIDAB in the last 72 hours. Anemia Panel: No results for input(s): VITAMINB12, FOLATE, FERRITIN, TIBC, IRON, RETICCTPCT in the last 72 hours. Urine analysis:    Component Value Date/Time   COLORURINE YELLOW 06/19/2021 2145   APPEARANCEUR CLEAR 06/19/2021 2145   LABSPEC 1.015 06/19/2021 2145   PHURINE  6.0 06/19/2021 2145   GLUCOSEU NEGATIVE 06/19/2021 2145   HGBUR NEGATIVE 06/19/2021 2145   BILIRUBINUR NEGATIVE 06/19/2021 2145   [place] NEGATIVE 06/19/2021 2145   PROTEINUR 100 (A) 06/19/2021 2145   NITRITE NEGATIVE 06/19/2021 2145   LEUKOCYTESUR NEGATIVE 06/19/2021 2145    Radiological Exams on Admission: CT ABDOMEN PELVIS W CONTRAST  Result Date: 07/05/2021 CLINICAL DATA:  Abdominal pain, history of Crohn's EXAM: CT ABDOMEN AND PELVIS WITH CONTRAST TECHNIQUE: Multidetector CT imaging of the abdomen and pelvis was performed using the standard protocol following bolus administration of intravenous contrast. CONTRAST:  55m OMNIPAQUE IOHEXOL 350 MG/ML SOLN COMPARISON:  CT abdomen pelvis dated June 15, 2021; CT abdomen and pelvis dated June 12, 2021 FINDINGS: Lower chest: Mild cardiomegaly. No pericardial effusion. Visualized lungs demonstrate bibasilar atelectasis. Hepatobiliary: Pneumobilia. Cholecystectomy. Mild biliary ductal dilation, not unexpected status post cholecystectomy. Pancreas: Unchanged mild prominence of the pancreatic duct. No surrounding inflammatory changes. Spleen: Normal in size without focal abnormality. Adrenals/Urinary Tract: Adrenal glands  are unremarkable. Kidneys are normal, without renal calculi, focal lesion, or hydronephrosis. Bladder is unremarkable. Stomach/Bowel: Extensive postsurgical changes of the bowel. Unchanged mild wall thickening of the descending colon. No additional areas of bowel wall thickening are seen, although evaluation is somewhat limited due to lack of oral contrast and paucity of intra-abdominal fat. No evidence of obstruction. Vascular/Lymphatic: Right lower extremity venous line with tip extending into the intrahepatic IVC. Normal caliber abdominal aorta with mild atherosclerotic disease. Reproductive: Status post hysterectomy. No adnexal masses. Other: No abdominal wall hernia or abnormality. No intra-abdominal fluid collections. Musculoskeletal:  No acute or significant osseous findings. IMPRESSION: Unchanged mild wall thickening of the descending colon. No additional areas of bowel wall thickening are seen, although evaluation is somewhat limited due to lack of oral contrast and paucity of intra-abdominal fat. No evidence of bowel obstruction or intra-abdominal abscess. Electronically Signed   By: Yetta Glassman MD   On: 07/05/2021 12:45    EKG: n/a  Assessment/Plan Active Problems:   Sepsis ([place])   Sepsis from unknown location Fever of 100.4, wbc 2.2 awaiting culture data Some concern for withdrawal but reasnoable to escalate antibiotics especially with deescalation to ciprofloxacin. --lactic acid normal --ID consulted, ED talked with ID in the emergency department and though escalation to vanc and penem --PICC site looks okay, need to clarify if culture should be taken from PICC site, culture if IR removes?  --ESR/CRP  H/o seizures with possible seizure activity vs. Delirium, will restart medicines --restart keppra as soon as possible Seizure activity? Abx, abscess in brain? Seizure precautions Neurochecks Consider neurology consult or head scan to look for ?abscess  Fever and generally unwell, I wonder if withdrawaling from not able to keep up with all her medicines, but don't completely understand her social situation.   Short Gut Syndrome, h/o crohn's  ---Consider starting TPN in the morning, consult dietician, she can eat some by mouth currently HTN - staggered start of blood pressure medicines, hypertensive because of pain and distress and probably withdrawaling from medicines Chronic pain - holding robaxin while restarting methadone, so this is why on high dose dilaudid.  narcan for oversomnolence, but watching for seizures. Mood - restart high dose duloxetine and high dose buproprion to twice daily from daily. recomebine on discharge Patient and/or Family completely agreed with the plan, expressed understanding and I  answered all questions.  CKD- renally dose, creatinine fairly stable ~1.5 Inflammation with HTN?   DVT prophylaxis: Lovenox SQ Code Status: Full code Family Communication: n/a Disposition Plan:  home or sNF? Consults called: ID, ED spoke with andrew wallace Admission status: inpatient because will take >3 days to determine regimen   A total of 90 minutes utilized during this admission.  [facility]   If 7PM-7AM, please contact night-coverage [email or web address] Password [facility]  07/05/2021, 9:06 PM

## 2021-07-05 NOTE — ED Triage Notes (Signed)
Pt to er, pt states that she was discharged on the 5th, pt to er via ems, per ems pt has a hx of crohn's and is here for abd pain and vomiting. PT states that she has a hard time eating, states that she is on tpn at home, states that she is here for abd pain.

## 2021-07-05 NOTE — ED Notes (Signed)
md notified of temp and pt's request for pain med, med orders received.

## 2021-07-05 NOTE — ED Notes (Signed)
Pt states that she didn't take her bp medication this am

## 2021-07-05 NOTE — ED Notes (Signed)
Pt requests pain and nausea med

## 2021-07-06 DIAGNOSIS — R6520 Severe sepsis without septic shock: Secondary | ICD-10-CM | POA: Diagnosis not present

## 2021-07-06 DIAGNOSIS — T827XXD Infection and inflammatory reaction due to other cardiac and vascular devices, implants and grafts, subsequent encounter: Secondary | ICD-10-CM | POA: Diagnosis not present

## 2021-07-06 DIAGNOSIS — E440 Moderate protein-calorie malnutrition: Secondary | ICD-10-CM | POA: Insufficient documentation

## 2021-07-06 DIAGNOSIS — R509 Fever, unspecified: Secondary | ICD-10-CM

## 2021-07-06 DIAGNOSIS — N1831 Chronic kidney disease, stage 3a: Secondary | ICD-10-CM | POA: Diagnosis not present

## 2021-07-06 DIAGNOSIS — A419 Sepsis, unspecified organism: Secondary | ICD-10-CM | POA: Diagnosis not present

## 2021-07-06 DIAGNOSIS — T80211A Bloodstream infection due to central venous catheter, initial encounter: Secondary | ICD-10-CM | POA: Diagnosis not present

## 2021-07-06 DIAGNOSIS — T80219D Unspecified infection due to central venous catheter, subsequent encounter: Secondary | ICD-10-CM | POA: Diagnosis not present

## 2021-07-06 DIAGNOSIS — K50818 Crohn's disease of both small and large intestine with other complication: Secondary | ICD-10-CM

## 2021-07-06 LAB — COMPREHENSIVE METABOLIC PANEL
ALT: 32 U/L (ref 0–44)
AST: 29 U/L (ref 15–41)
Albumin: 2.7 g/dL — ABNORMAL LOW (ref 3.5–5.0)
Alkaline Phosphatase: 96 U/L (ref 38–126)
Anion gap: 11 (ref 5–15)
BUN: 22 mg/dL — ABNORMAL HIGH (ref 6–20)
CO2: 27 mmol/L (ref 22–32)
Calcium: 9.3 mg/dL (ref 8.9–10.3)
Chloride: 99 mmol/L (ref 98–111)
Creatinine, Ser: 1.27 mg/dL — ABNORMAL HIGH (ref 0.44–1.00)
GFR, Estimated: 48 mL/min — ABNORMAL LOW (ref >60)
Glucose, Bld: 97 mg/dL (ref 70–99)
Potassium: 3.6 mmol/L (ref 3.5–5.1)
Sodium: 137 mmol/L (ref 135–145)
Total Bilirubin: 0.7 mg/dL (ref 0.3–1.2)
Total Protein: 6 g/dL — ABNORMAL LOW (ref 6.5–8.1)

## 2021-07-06 LAB — CBC
HCT: 26.9 % — ABNORMAL LOW (ref 36.0–46.0)
Hemoglobin: 8.9 g/dL — ABNORMAL LOW (ref 12.0–15.0)
MCH: 30.2 pg (ref 26.0–34.0)
MCHC: 33.1 g/dL (ref 30.0–36.0)
MCV: 91.2 fL (ref 80.0–100.0)
Platelets: 124 10*3/uL — ABNORMAL LOW (ref 150–400)
RBC: 2.95 MIL/uL — ABNORMAL LOW (ref 3.87–5.11)
RDW: 14.2 % (ref 11.5–15.5)
WBC: 2.3 10*3/uL — ABNORMAL LOW (ref 4.0–10.5)
nRBC: 0 % (ref 0.0–0.2)

## 2021-07-06 MED ORDER — HYDROMORPHONE HCL 1 MG/ML IJ SOLN
1.0000 mg | Freq: Four times a day (QID) | INTRAMUSCULAR | Status: DC | PRN
  Administered 2021-07-06 – 2021-07-07 (×3): 1 mg via INTRAVENOUS
  Filled 2021-07-06 (×3): qty 1

## 2021-07-06 MED ORDER — CALCIUM CARBONATE 1250 (500 CA) MG PO TABS
1.0000 | ORAL_TABLET | Freq: Every day | ORAL | Status: DC
  Administered 2021-07-07 – 2021-07-09 (×3): 500 mg via ORAL
  Filled 2021-07-06 (×4): qty 1

## 2021-07-06 MED ORDER — PANCRELIPASE (LIP-PROT-AMYL) 36000-114000 UNITS PO CPEP
36000.0000 [IU] | ORAL_CAPSULE | Freq: Three times a day (TID) | ORAL | Status: DC
  Administered 2021-07-06 – 2021-07-14 (×13): 36000 [IU] via ORAL
  Filled 2021-07-06: qty 1
  Filled 2021-07-06 (×2): qty 3
  Filled 2021-07-06 (×2): qty 1
  Filled 2021-07-06 (×2): qty 3
  Filled 2021-07-06 (×3): qty 1
  Filled 2021-07-06: qty 3
  Filled 2021-07-06 (×6): qty 1
  Filled 2021-07-06: qty 3

## 2021-07-06 MED ORDER — CALCIUM 200 MG PO TABS: 200.0000 mg | ORAL_TABLET | Freq: Every day | ORAL | Status: DC

## 2021-07-06 MED ORDER — PROSOURCE PLUS PO LIQD
30.0000 mL | Freq: Two times a day (BID) | ORAL | Status: DC
  Administered 2021-07-06 – 2021-07-14 (×13): 30 mL via ORAL
  Filled 2021-07-06 (×13): qty 30

## 2021-07-06 MED ORDER — PANCRELIPASE (LIP-PROT-AMYL) 12000-38000 UNITS PO CPEP
36000.0000 [IU] | ORAL_CAPSULE | Freq: Three times a day (TID) | ORAL | Status: DC
  Filled 2021-07-06: qty 3

## 2021-07-06 MED ORDER — BUDESONIDE 3 MG PO CPEP
3.0000 mg | ORAL_CAPSULE | ORAL | Status: DC
  Administered 2021-07-07 – 2021-07-13 (×3): 3 mg via ORAL
  Filled 2021-07-06 (×3): qty 1

## 2021-07-06 MED ORDER — CYCLOSPORINE 0.05 % OP EMUL
1.0000 [drp] | Freq: Two times a day (BID) | OPHTHALMIC | Status: DC
  Administered 2021-07-06 – 2021-07-14 (×14): 1 [drp] via OPHTHALMIC
  Filled 2021-07-06 (×18): qty 1

## 2021-07-06 MED ORDER — VANCOMYCIN HCL IN DEXTROSE 1-5 GM/200ML-% IV SOLN
1000.0000 mg | INTRAVENOUS | Status: DC
  Administered 2021-07-06: 1000 mg via INTRAVENOUS
  Filled 2021-07-06: qty 200

## 2021-07-06 MED ORDER — VITAMIN D 25 MCG (1000 UNIT) PO TABS
1000.0000 [IU] | ORAL_TABLET | Freq: Every day | ORAL | Status: DC
  Administered 2021-07-07 – 2021-07-14 (×8): 1000 [IU] via ORAL
  Filled 2021-07-06 (×9): qty 1

## 2021-07-06 MED ORDER — FAMOTIDINE 20 MG PO TABS: 20.0000 mg | ORAL_TABLET | Freq: Every day | ORAL | Status: DC | PRN

## 2021-07-06 NOTE — Progress Notes (Signed)
Initial Nutrition Assessment  DOCUMENTATION CODES:  Non-severe (moderate) malnutrition in context of chronic illness  INTERVENTION:  Recommend starting TPN per Pharmacy based on needs below.  Add 30 ml ProSource Plus po BID, each supplement provides 100 kcal and 15 grams of protein.    NUTRITION DIAGNOSIS:  Moderate Malnutrition related to chronic illness as evidenced by mild fat depletion, mild muscle depletion.  GOAL:  Patient will meet greater than or equal to 90% of their needs  MONITOR:  I & O's, PO intake, Supplement acceptance, Labs, Weight trends  REASON FOR ASSESSMENT:  Malnutrition Screening Tool    ASSESSMENT:  60 y.o. 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 She has not been feeling well.  She takes a lot of medicines.  She is lost 5 pounds since discharge despite being on TPN and tolerating some food orally.  Spoke with pt at bedside briefly. She reports wanting to start her TPN. Pharmacy had left the room before RD walked in, likely to assess to begin her TPN here.  Pt requested ProSource Plus. RD to add it BID to promote protein intake.  Per Epic, pt has gained a bit of weight back (~1 kg) since her previous admission.  Pt with some mild depletions on exam.  Medications: reviewed; Keppra BID, methadone 5 times daily, Protonix, Vitamin B12, Merrem BID via IV, Vancomycin via IV, Dilaudid PRN (given once today)  Labs: reviewed  NUTRITION - FOCUSED PHYSICAL EXAM: Flowsheet Row Most  Recent Value  Orbital Region No depletion  Upper Arm Region Mild depletion  Thoracic and Lumbar Region Mild depletion  Buccal Region No depletion  Temple Region No depletion  Clavicle Bone Region No depletion  Clavicle and Acromion Bone Region No depletion  Scapular Bone Region No depletion  Dorsal Hand Mild depletion  Patellar Region Mild depletion  Anterior Thigh Region Mild depletion  Posterior Calf Region Mild depletion  Edema (RD Assessment) None  Hair Reviewed  Eyes Reviewed  Mouth Reviewed  Skin Reviewed  Nails Reviewed   Diet Order:   Diet Order             Diet regular Room service appropriate? Yes; Fluid consistency: Thin  Diet effective now                  EDUCATION NEEDS:  Education needs have been addressed  Skin:  Skin Assessment: Reviewed RN Assessment  Last BM:  PTA/unknown  Height:  Ht Readings from Last 1 Encounters:  07/05/21 5' 8"  (1.727 m)   Weight:  Wt Readings from Last 1 Encounters:  07/06/21 60.5 kg   BMI:  Body mass index is 20.28 kg/m.  Estimated Nutritional Needs:  Kcal:  1800-2000 Protein:  90-105 grams Fluid:  >1.8 L  Derrel Nip, RD, LDN (she/her/hers) Registered Dietitian I After-Hours/Weekend Pager # in [place]

## 2021-07-06 NOTE — Consult Note (Signed)
[facility] for Infectious Disease    Date of Admission:  07/05/2021     Reason for Consult: Sepsis     Referring Physician: Dr Francesco Sor  Current antibiotics: Vancomycin 8/10-pres Meropenem 8/10-pres   Previous antibiotics: Cipro PO PTA  ASSESSMENT:    Sepsis likely from recurrent line infection Hx of Acinetobacter BSI secondary to line infection with line last exchanged May 2022.  Suspect she may have recurrent bacteremia in the setting of poor oral absorption from [place] 2/2 Crohn's disease CKD Hx of discitis (cx negative) s/p 6 weeks dapto/cefepime (completed 02/2021)  PLAN:    Continue meropenem and vancomycin Follow cx If blood cx positive, will need to d/w IR re: line exchange (ideally would have holiday but her vascular access may be prohibitive) Will follow   Principal Problem:   Sepsis ([place]) Active Problems:   Crohn's disease of both small and large intestine with other complication ([place])   Short gut syndrome   On total parenteral nutrition (TPN)   Central line infection   Bacteremia associated with intravascular line ([name])   CKD (chronic kidney disease) stage 3, GFR 30-59 ml/min ([name])   MEDICATIONS:    Scheduled Meds:  (feeding supplement) PROSource Plus  30 mL Oral BID BM   amLODipine  10 mg Oral Daily   buPROPion ER  100 mg Oral BID   carvedilol  25 mg Oral BID WC   Chlorhexidine Gluconate Cloth  6 each Topical Daily   DULoxetine  60 mg Oral BID   enoxaparin (LOVENOX) injection  40 mg Subcutaneous Q24H   hydrALAZINE  100 mg Oral TID   levETIRAcetam  500 mg Oral BID   methadone  5 mg Oral 5 X Daily   pantoprazole  40 mg Oral Daily   vitamin B-12  1,000 mcg Oral Daily   Continuous Infusions:  meropenem (MERREM) IV 1 g (07/06/21 0509)   vancomycin     PRN Meds:.HYDROmorphone (DILAUDID) injection, promethazine, sodium chloride flush  HPI:    Caitlin Peters is a 60 y.o. female with PMHx of SGS 2/2 Crohn's disease with hx of multiple  line infections, poor vascular access with chronic femoral PICC line (last exchanged May 2022) who presented to the ED yesterday with recurrence of fevers.  She was recently admitted here from 7/25-8/5 for Acinetobacter urdingii bacteremia.  She was treated with broad spectrum antibiotics.  She was transitioned to Cipro based on susceptibilities which were given IV while inpatient.  She was transitioned to PO at discharge which may have been inadequate given her SGS.  She was febrile in the ED 100.4, WBC 2.8 (chronic), creat 1.4.  Started on vanc and mero after blood cx obtained.  Admitted for further work up of presumed recurrence of bacteremia. She had a CT scan done in the ED that was unremarkable for a new complications.    Past Medical History:  Diagnosis Date   Acute pancreatitis 04/13/2020   Anasarca 10/2019   AVN (avascular necrosis of bone) ([name])    Cataract    Choledocholithiasis (sludge) s/p ERCP 10/2019 10/21/2020   Chronic pain syndrome    CKD (chronic kidney disease), stage III ([place])    Crohn disease ([place])    Crohn's disease of small & large intestine with SGS 1984   Caitlin Peters is a 61 year old female with a history of Crohn's disease diagnosed in 53 (age 67), history of long-term steroid use with osteoporosis, S/P multiple bowel resections ([phone]) complicated  by chronic back and abdominal pain, steatorrhea and short bowel syndrome. The patient has been left with ~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 attached to proximal transverse colon through rectum. 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; 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; jejunal to transverse colon anastomosis Treated at  [facility] lengthening to 165cm Dr Alene Mires, [facility]   Vitamin B12 deficiency     Social History   Tobacco Use   Smoking status: Former   Smokeless tobacco: Never  Scientific laboratory technician Use: Never used  Substance Use Topics   Alcohol use: Not Currently   Drug use: Never    Family History  Problem Relation Age of Onset   Seizures Mother    Glaucoma Mother    CAD Father    Heart disease Father    Hypertension Father    Breast cancer Sister    Multiple sclerosis Sister    Diabetes Sister    Lupus Sister    Colon cancer Other    Crohn's disease Other     Allergies  Allergen Reactions   Meperidine Hives    Other reaction(s): GI Upset Due to Chrones    Hyoscyamine Hives and Swelling    Legs swelling Disorientation   Cefepime Other (See Comments)    Neurotoxicity occurring in setting of AKI. Ceftriaxone tolerated during same admit   Gabapentin Other (See Comments)    unknown   Lyrica [Pregabalin] Other (See Comments)    unknown   Topamax [Topiramate] Other (See Comments)    unknown   Zosyn [Piperacillin Sod-Tazobactam So] Other (See Comments)    Patient reports it makes her vomit, her neck stiff, and her "heart feel funny"   Fentanyl Rash    Pt is allergic to fentanyl patch related to the glue (gives her a rash) Pt states she is NOT allergic to fentanyl IV medicine   Morphine And Related Rash    Review of Systems  Constitutional:  Positive for fever and malaise/fatigue.  Respiratory: Negative.    Cardiovascular: Negative.   Gastrointestinal:  Positive for abdominal pain.  Genitourinary: Negative.   Musculoskeletal:  Positive for back pain and joint pain.  Skin: Negative.   Neurological: Negative.   All other systems reviewed and are negative.  OBJECTIVE:   Blood pressure (!) 174/82, pulse 80, temperature 100.1 F (37.8 C), temperature source Oral, resp. rate 18, height 5' 8"  (1.727 m), weight 60.5 kg, SpO2 98  %. Body mass index is 20.28 kg/m.  Physical Exam Constitutional:      General: She is not in acute distress.    Appearance: Normal appearance.  HENT:     Head: Normocephalic and atraumatic.  Eyes:     Extraocular Movements: Extraocular movements intact.     Conjunctiva/sclera: Conjunctivae normal.  Pulmonary:     Effort: Pulmonary effort is normal. No respiratory distress.  Abdominal:     General: There is no distension.     Palpations: Abdomen is soft.  Musculoskeletal:        General: Normal range of motion.  Cervical back: Normal range of motion and neck supple.  Skin:    General: Skin is warm and dry.     Findings: No rash.     Comments: Right femoral PICC line  Neurological:     General: No focal deficit present.     Mental Status: She is alert and oriented to person, place, and time.  Psychiatric:        Mood and Affect: Mood normal.        Behavior: Behavior normal.     Lab Results: Lab Results  Component Value Date   WBC 2.3 (L) 07/06/2021   HGB 8.9 (L) 07/06/2021   HCT 26.9 (L) 07/06/2021   MCV 91.2 07/06/2021   PLT 124 (L) 07/06/2021    Lab Results  Component Value Date   NA 137 07/06/2021   K 3.6 07/06/2021   CO2 27 07/06/2021   GLUCOSE 97 07/06/2021   BUN 22 (H) 07/06/2021   CREATININE 1.27 (H) 07/06/2021   CALCIUM 9.3 07/06/2021   GFRNONAA 48 (L) 07/06/2021   GFRAA 55 (L) 08/12/2020    Lab Results  Component Value Date   ALT 32 07/06/2021   AST 29 07/06/2021   ALKPHOS 96 07/06/2021   BILITOT 0.7 07/06/2021       Component Value Date/Time   CRP 2.8 (H) 07/05/2021 2138       Component Value Date/Time   ESRSEDRATE 33 (H) 07/05/2021 2138    I have reviewed the micro and lab results in Epic.  Imaging: CT ABDOMEN PELVIS W CONTRAST  Result Date: 07/05/2021 CLINICAL DATA:  Abdominal pain, history of Crohn's EXAM: CT ABDOMEN AND PELVIS WITH CONTRAST TECHNIQUE: Multidetector CT imaging of the abdomen and pelvis was performed using  the standard protocol following bolus administration of intravenous contrast. CONTRAST:  33m OMNIPAQUE IOHEXOL 350 MG/ML SOLN COMPARISON:  CT abdomen pelvis dated June 15, 2021; CT abdomen and pelvis dated June 12, 2021 FINDINGS: Lower chest: Mild cardiomegaly. No pericardial effusion. Visualized lungs demonstrate bibasilar atelectasis. Hepatobiliary: Pneumobilia. Cholecystectomy. Mild biliary ductal dilation, not unexpected status post cholecystectomy. Pancreas: Unchanged mild prominence of the pancreatic duct. No surrounding inflammatory changes. Spleen: Normal in size without focal abnormality. Adrenals/Urinary Tract: Adrenal glands are unremarkable. Kidneys are normal, without renal calculi, focal lesion, or hydronephrosis. Bladder is unremarkable. Stomach/Bowel: Extensive postsurgical changes of the bowel. Unchanged mild wall thickening of the descending colon. No additional areas of bowel wall thickening are seen, although evaluation is somewhat limited due to lack of oral contrast and paucity of intra-abdominal fat. No evidence of obstruction. Vascular/Lymphatic: Right lower extremity venous line with tip extending into the intrahepatic IVC. Normal caliber abdominal aorta with mild atherosclerotic disease. Reproductive: Status post hysterectomy. No adnexal masses. Other: No abdominal wall hernia or abnormality. No intra-abdominal fluid collections. Musculoskeletal: No acute or significant osseous findings. IMPRESSION: Unchanged mild wall thickening of the descending colon. No additional areas of bowel wall thickening are seen, although evaluation is somewhat limited due to lack of oral contrast and paucity of intra-abdominal fat. No evidence of bowel obstruction or intra-abdominal abscess. Electronically Signed   By: LYetta GlassmanMD   On: 07/05/2021 12:45     Imaging independently reviewed in Epic.  ARaynelle Highlandfor Infectious Disease [facility] [phone] pager 07/06/2021, 12:25 PM

## 2021-07-06 NOTE — Plan of Care (Signed)
Care plan initiated.

## 2021-07-06 NOTE — Progress Notes (Signed)
Caitlin Peters  ONG:[id number] DOB: 01/09/1961 DOA: 07/05/2021 PCP: [name], [name], MD    Brief Narrative:  [phone] with a history of short gut syndrome on chronic TPN via a PICC line, seizure disorder, Crohn's disease, multiple abdominal surgeries, and recent admission/discharge 8/5 > 14/2022 for Acinetobacter bacteremia who returned to the ED with increased generalized abdominal pain and was found to have a low grade temp elevation at 100.4.  Significant Events:  8/10 admit via ER  Consultants:  ID  Code Status: FULL CODE  Antimicrobials:  Vancomycin 8/10 > Meropenem 8/10 >  DVT prophylaxis: Lovenox  Subjective: No true fever since admission but temperature remains modestly elevated at 99-100.1.  Blood pressure poorly controlled.  Saturations stable.  Complains of ongoing uncontrolled abdominal pain.  Assessment & Plan:  FUO Tm of 100.4 since presentation - WBC chronically low at 2.2 presently - abx per ID Team   Seizure D/O Cont usual keppra dose   Short gut syndrom - complex hx of Crohn's  TPN dependent   HTN Blood pressure reasonably controlled at this time  Chronic pain Changed to IV route for pain medication for now out of concern for poor absorption  CKD Baseline crt ~1.5 -monitor trend   Family Communication: No family present Status is: Inpatient  Remains inpatient appropriate because:Inpatient level of care appropriate due to severity of illness  Dispo: The patient is from: Home              Anticipated d/c is to:  Unclear              Patient currently is not medically stable to d/c.   Difficult to place patient No   Objective: Blood pressure (!) 174/82, pulse 80, temperature 100.1 F (37.8 C), temperature source Oral, resp. rate 18, height 5' 8"  (1.727 m), weight 60.5 kg, SpO2 98 %.  Intake/Output Summary (Last 24 hours) at 07/06/2021 1001 Last data filed at 07/06/2021 0500 Gross per 24 hour  Intake 600.38 ml  Output 2 ml  Net  598.38 ml   Filed Weights   07/05/21 0723 07/06/21 0500  Weight: 54.4 kg 60.5 kg    Examination: General: No acute respiratory distress Lungs: Clear to auscultation bilaterally without wheezes or crackles Cardiovascular: Regular rate and rhythm without murmur gallop or rub normal S1 and S2 Abdomen: Tender to palpation diffusely, no mass, no rebound, bowel sounds hypoactive Extremities: No significant cyanosis, clubbing, or edema bilateral lower extremities  CBC: Recent Labs  Lab 07/05/21 0924 07/06/21 0646  WBC 2.8* 2.3*  NEUTROABS 2.1  --   HGB 8.3* 8.9*  HCT 25.1* 26.9*  MCV 91.9 91.2  PLT 137* 027*   Basic Metabolic Panel: Recent Labs  Lab 06/30/21 0350 07/05/21 0924 07/06/21 0646  NA 139 138 137  K 3.5 3.9 3.6  CL 111 100 99  CO2 26 27 27   GLUCOSE 109* 107* 97  BUN 26* 36* 22*  CREATININE 1.56* 1.42* 1.27*  CALCIUM 9.3 9.3 9.3  MG 1.8  --   --   PHOS 4.3  --   --    GFR: Estimated Creatinine Clearance: 45 mL/min (A) (by C-G formula based on SCr of 1.27 mg/dL (H)).  Liver Function Tests: Recent Labs  Lab 06/30/21 0350 07/05/21 0924 07/06/21 0646  AST 11* 23 29  ALT 24 23 32  ALKPHOS 112 92 96  BILITOT 0.5 0.5 0.7  PROT 5.7* 6.2* 6.0*  ALBUMIN 2.6* 2.9* 2.7*   Recent Labs  Lab 07/05/21 0924  LIPASE 38     HbA1C: Hgb A1c MFr Bld  Date/Time Value Ref Range Status  05/22/2021 03:15 PM 5.0 4.8 - 5.6 % Final    Comment:    (NOTE)         Prediabetes: 5.7 - 6.4         Diabetes: >6.4         Glycemic control for adults with diabetes: <7.0   10/28/2019 10:23 PM 4.7 (L) 4.8 - 5.6 % Final    Comment:    (NOTE) Pre diabetes:          5.7%-6.4% Diabetes:              >6.4% Glycemic control for   <7.0% adults with diabetes     CBG: Recent Labs  Lab 06/30/21 0019 06/30/21 0617 06/30/21 0722 06/30/21 1127 06/30/21 1612  GLUCAP 109* 97 112* 107* 123*    Recent Results (from the past 240 hour(s))  Resp Panel by RT-PCR (Flu A&B,  Covid) Nasopharyngeal Swab     Status: None   Collection Time: 07/05/21  9:24 AM   Specimen: Nasopharyngeal Swab; Nasopharyngeal(NP) swabs in vial transport medium  Result Value Ref Range Status   SARS Coronavirus 2 by RT PCR NEGATIVE NEGATIVE Final    Comment: (NOTE) SARS-CoV-2 target nucleic acids are NOT DETECTED.  The SARS-CoV-2 RNA is generally detectable in upper respiratory specimens during the acute phase of infection. The lowest concentration of SARS-CoV-2 viral copies this assay can detect is 138 copies/mL. A negative result does not preclude SARS-Cov-2 infection and should not be used as the sole basis for treatment or other patient management decisions. A negative result may occur with  improper specimen collection/handling, submission of specimen other than nasopharyngeal swab, presence of viral mutation(s) within the areas targeted by this assay, and inadequate number of viral copies(<138 copies/mL). A negative result must be combined with clinical observations, patient history, and epidemiological information. The expected result is Negative.  Fact Sheet for Patients:  EntrepreneurPulse.com.au  Fact Sheet for Healthcare Providers:  IncredibleEmployment.be  This test is no t yet approved or cleared by the [place] FDA and  has been authorized for detection and/or diagnosis of SARS-CoV-2 by FDA under an Emergency Use Authorization (EUA). This EUA will remain  in effect (meaning this test can be used) for the duration of the COVID-19 declaration under Section 564(b)(1) of the Act, 21 U.S.C.section 360bbb-3(b)(1), unless the authorization is terminated  or revoked sooner.       Influenza A by PCR NEGATIVE NEGATIVE Final   Influenza B by PCR NEGATIVE NEGATIVE Final    Comment: (NOTE) The Xpert Xpress SARS-CoV-2/FLU/RSV plus assay is intended as an aid in the diagnosis of influenza from Nasopharyngeal swab specimens and should  not be used as a sole basis for treatment. Nasal washings and aspirates are unacceptable for Xpert Xpress SARS-CoV-2/FLU/RSV testing.  Fact Sheet for Patients: EntrepreneurPulse.com.au  Fact Sheet for Healthcare Providers: IncredibleEmployment.be  This test is not yet approved or cleared by the [place] FDA and has been authorized for detection and/or diagnosis of SARS-CoV-2 by FDA under an Emergency Use Authorization (EUA). This EUA will remain in effect (meaning this test can be used) for the duration of the COVID-19 declaration under Section 564(b)(1) of the Act, 21 U.S.C. section 360bbb-3(b)(1), unless the authorization is terminated or revoked.  Performed at [facility], [place] [address] [place], [place] [address]      Scheduled  Meds:  amLODipine  10 mg Oral Daily   buPROPion ER  100 mg Oral BID   carvedilol  25 mg Oral BID WC   Chlorhexidine Gluconate Cloth  6 each Topical Daily   DULoxetine  60 mg Oral BID   enoxaparin (LOVENOX) injection  40 mg Subcutaneous Q24H   hydrALAZINE  100 mg Oral TID   levETIRAcetam  500 mg Oral BID   methadone  5 mg Oral 5 X Daily   pantoprazole  40 mg Oral Daily   vitamin B-12  1,000 mcg Oral Daily   Continuous Infusions:  meropenem (MERREM) IV 1 g (07/06/21 0509)   vancomycin       LOS: 1 day   Cherene Altes, MD [facility] Office  [phone] Pager - Text Page per Amion  If 7PM-7AM, please contact night-coverage per Amion 07/06/2021, 10:01 AM

## 2021-07-06 NOTE — Progress Notes (Signed)
Pharmacy Antibiotic Note  Pt is a 13 yoF with PMH significant for frequent hospitalizations, short gut syndrome on chronic TPN, Chron's disease, recent Acinetobacter ursingii bacteremia, admitted for fevers. Broad spectrum antibiotics being initiated on admission; Pharmacy to dose vancomycin.  Today, 07/06/2021: D1 full abx WBC remain low SCr improved Borderline temps  Plan: Increase vancomycin to 1000 mg IV q24 hr with improved renal function (AUC 541 w/ SCr 1.27; Vd 0.72) Continue meropenem 1g q12 SCr tomorrow   Height: 5' 8"  (172.7 cm) Weight: 60.5 kg (133 lb 6.1 oz) IBW/kg (Calculated) : 63.9  Temp (24hrs), Avg:99.7 F (37.6 C), Min:98.7 F (37.1 C), Max:100.4 F (38 C)  Recent Labs  Lab 06/30/21 0350 07/05/21 0924 07/05/21 2050 07/06/21 0646  WBC  --  2.8*  --  2.3*  CREATININE 1.56* 1.42*  --  1.27*  LATICACIDVEN  --  0.8 0.8  --      Estimated Creatinine Clearance: 45 mL/min (A) (by C-G formula based on SCr of 1.27 mg/dL (H)).    Allergies  Allergen Reactions   Meperidine Hives    Other reaction(s): GI Upset Due to Chrones    Hyoscyamine Hives and Swelling    Legs swelling Disorientation   Cefepime Other (See Comments)    Neurotoxicity occurring in setting of AKI. Ceftriaxone tolerated during same admit   Gabapentin Other (See Comments)    unknown   Lyrica [Pregabalin] Other (See Comments)    unknown   Topamax [Topiramate] Other (See Comments)    unknown   Zosyn [Piperacillin Sod-Tazobactam So] Other (See Comments)    Patient reports it makes her vomit, her neck stiff, and her "heart feel funny"   Fentanyl Rash    Pt is allergic to fentanyl patch related to the glue (gives her a rash) Pt states she is NOT allergic to fentanyl IV medicine   Morphine And Related Rash    Antimicrobials this admission: meropenem 8/10 >>  vancomycin 8/10 >>   Dose adjustments this admission: 8/11: incr vanc 750 >> 1000 q24 w/ improved SCr  Microbiology  results: 8/10 BCx: sent 7/25 BCx: Acinetobacter ursingii (R SMX/TMP) 7/31 rpt BCx: ngtd  Thank you for allowing pharmacy to be a part of this patient's care.  Hassani Sliney, Cindie Laroche, PharmD 07/06/2021 8:32 AM

## 2021-07-07 DIAGNOSIS — T827XXD Infection and inflammatory reaction due to other cardiac and vascular devices, implants and grafts, subsequent encounter: Secondary | ICD-10-CM

## 2021-07-07 DIAGNOSIS — A419 Sepsis, unspecified organism: Secondary | ICD-10-CM | POA: Diagnosis not present

## 2021-07-07 DIAGNOSIS — R7881 Bacteremia: Secondary | ICD-10-CM

## 2021-07-07 DIAGNOSIS — R509 Fever, unspecified: Secondary | ICD-10-CM | POA: Diagnosis not present

## 2021-07-07 DIAGNOSIS — N1831 Chronic kidney disease, stage 3a: Secondary | ICD-10-CM | POA: Diagnosis not present

## 2021-07-07 DIAGNOSIS — T80219A Unspecified infection due to central venous catheter, initial encounter: Secondary | ICD-10-CM

## 2021-07-07 DIAGNOSIS — T80211A Bloodstream infection due to central venous catheter, initial encounter: Secondary | ICD-10-CM | POA: Diagnosis not present

## 2021-07-07 DIAGNOSIS — K50818 Crohn's disease of both small and large intestine with other complication: Secondary | ICD-10-CM | POA: Diagnosis not present

## 2021-07-07 LAB — CBC
HCT: 27 % — ABNORMAL LOW (ref 36.0–46.0)
Hemoglobin: 8.8 g/dL — ABNORMAL LOW (ref 12.0–15.0)
MCH: 30 pg (ref 26.0–34.0)
MCHC: 32.6 g/dL (ref 30.0–36.0)
MCV: 92.2 fL (ref 80.0–100.0)
Platelets: 120 10*3/uL — ABNORMAL LOW (ref 150–400)
RBC: 2.93 MIL/uL — ABNORMAL LOW (ref 3.87–5.11)
RDW: 13.9 % (ref 11.5–15.5)
WBC: 1.6 10*3/uL — ABNORMAL LOW (ref 4.0–10.5)
nRBC: 0 % (ref 0.0–0.2)

## 2021-07-07 LAB — COMPREHENSIVE METABOLIC PANEL
ALT: 49 U/L — ABNORMAL HIGH (ref 0–44)
AST: 49 U/L — ABNORMAL HIGH (ref 15–41)
Albumin: 2.5 g/dL — ABNORMAL LOW (ref 3.5–5.0)
Alkaline Phosphatase: 113 U/L (ref 38–126)
Anion gap: 7 (ref 5–15)
BUN: 32 mg/dL — ABNORMAL HIGH (ref 6–20)
CO2: 27 mmol/L (ref 22–32)
Calcium: 9 mg/dL (ref 8.9–10.3)
Chloride: 100 mmol/L (ref 98–111)
Creatinine, Ser: 1.89 mg/dL — ABNORMAL HIGH (ref 0.44–1.00)
GFR, Estimated: 30 mL/min — ABNORMAL LOW (ref >60)
Glucose, Bld: 119 mg/dL — ABNORMAL HIGH (ref 70–99)
Potassium: 3.3 mmol/L — ABNORMAL LOW (ref 3.5–5.1)
Sodium: 134 mmol/L — ABNORMAL LOW (ref 135–145)
Total Bilirubin: 0.6 mg/dL (ref 0.3–1.2)
Total Protein: 5.6 g/dL — ABNORMAL LOW (ref 6.5–8.1)

## 2021-07-07 LAB — PHOSPHORUS: Phosphorus: 4.5 mg/dL (ref 2.5–4.6)

## 2021-07-07 LAB — MAGNESIUM: Magnesium: 1.8 mg/dL (ref 1.7–2.4)

## 2021-07-07 MED ORDER — HYDROMORPHONE HCL 1 MG/ML IJ SOLN
1.0000 mg | INTRAMUSCULAR | Status: DC | PRN
  Administered 2021-07-07 – 2021-07-08 (×8): 1 mg via INTRAVENOUS
  Filled 2021-07-07 (×8): qty 1

## 2021-07-07 MED ORDER — POTASSIUM CHLORIDE 10 MEQ/100ML IV SOLN: 10.0000 meq | INTRAVENOUS | Status: DC

## 2021-07-07 MED ORDER — MAGNESIUM SULFATE 2 GM/50ML IV SOLN
2.0000 g | Freq: Once | INTRAVENOUS | Status: AC
  Administered 2021-07-07: 2 g via INTRAVENOUS
  Filled 2021-07-07: qty 50

## 2021-07-07 MED ORDER — KCL IN DEXTROSE-NACL 20-5-0.9 MEQ/L-%-% IV SOLN
INTRAVENOUS | Status: AC
  Filled 2021-07-07: qty 1000

## 2021-07-07 MED ORDER — POTASSIUM CHLORIDE 10 MEQ/100ML IV SOLN
10.0000 meq | INTRAVENOUS | Status: AC
Start: 2021-07-07 — End: 2021-07-07
  Administered 2021-07-07 (×2): 10 meq via INTRAVENOUS
  Filled 2021-07-07 (×2): qty 100

## 2021-07-07 MED ORDER — SODIUM CHLORIDE 0.9 % IV SOLN: INTRAVENOUS | Status: DC

## 2021-07-07 MED ORDER — POTASSIUM CHLORIDE 10 MEQ/100ML IV SOLN
INTRAVENOUS | Status: AC
  Administered 2021-07-07: 10 meq via INTRAVENOUS
  Filled 2021-07-07: qty 100

## 2021-07-07 MED ORDER — TRAVASOL 10 % IV SOLN
INTRAVENOUS | Status: AC
  Filled 2021-07-07: qty 900

## 2021-07-07 MED ORDER — ENOXAPARIN SODIUM 30 MG/0.3ML IJ SOSY: 30.0000 mg | PREFILLED_SYRINGE | INTRAMUSCULAR | Status: DC

## 2021-07-07 NOTE — Progress Notes (Signed)
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  Significant Events:  8/10 admit via ER  Consultants:  ID  Code Status: FULL CODE  Antimicrobials:  Vancomycin 8/10 > Meropenem 8/10 >  DVT prophylaxis: Lovenox  Subjective: Afebrile last 24 hours.  Blood pressure stable.  Saturations 99% on room air.  Feeling much better overall today.  Abdominal pain improved.  No chest pain or shortness of breath.  Assessment & Plan:  FUO - possible bacteremia due to CVL infection  Tm of 100.4 since presentation - WBC chronically low - abx per ID Team -monitoring blood culture results -see ID notation for antibiotic plan going forward  Hypokalemia Supplement and follow -resume TPN  Short gut syndrome - complex hx of Crohn's  TPN dependent at home - resume TPN per pharmacy   Acute renal injury on CKD Creatinine has increased over the last 24 hours - likely simply prerenal - hydrate and follow - baseline crt ~1.5   Mild transaminitis LFTs very mildly elevated today -follow trend  Seizure D/O Cont usual keppra dose   HTN Blood pressure controlled at this time  Chronic pain Changed to IV route for pain medication for now out of concern for poor absorption   Family Communication: No family present Status is: Inpatient  Remains inpatient appropriate because:Inpatient level of care appropriate due to severity of illness  Dispo: The patient is from: Home              Anticipated d/c is to:  Unclear              Patient currently is not medically stable to d/c.   Difficult to  place patient No   Objective: Blood pressure 133/76, pulse 71, temperature 98.7 F (37.1 C), temperature source Oral, resp. rate 16, height 5' 8"  (1.727 m), weight 59.3 kg, SpO2 99 %.  Intake/Output Summary (Last 24 hours) at 07/07/2021 0944 Last data filed at 07/06/2021 2300 Gross per 24 hour  Intake 256.42 ml  Output 450 ml  Net -193.58 ml    Filed Weights   07/05/21 0723 07/06/21 0500 07/07/21 0500  Weight: 54.4 kg 60.5 kg 59.3 kg    Examination: General: No acute respiratory distress Lungs: Clear to auscultation bilaterally -no wheezing Cardiovascular: RRR without murmur or rub Abdomen: Tender to palpation diffusely, no mass, no rebound, bowel sounds hypoactive Extremities: No significant cyanosis, clubbing, or edema bilateral lower extremities  CBC: Recent Labs  Lab 07/05/21 0924 07/06/21 0646 07/07/21 0401  WBC 2.8* 2.3* 1.6*  NEUTROABS 2.1  --   --   HGB 8.3* 8.9* 8.8*  HCT 25.1* 26.9* 27.0*  MCV 91.9 91.2 92.2  PLT 137* 124* 120*    Basic Metabolic Panel: Recent Labs  Lab 07/05/21 0924 07/06/21 0646 07/07/21 0401  NA 138 137 134*  K 3.9 3.6 3.3*  CL 100 99 100  CO2 27 27 27   GLUCOSE 107* 97 119*  BUN 36* 22* 32*  CREATININE 1.42* 1.27* 1.89*  CALCIUM 9.3 9.3 9.0  MG  --   --  1.8  PHOS  --   --  4.5    GFR: Estimated Creatinine Clearance: 29.6 mL/min (A) (by C-G formula based on SCr of 1.89 mg/dL (H)).  Liver Function Tests: Recent Labs  Lab 07/05/21 0924 07/06/21 0646 07/07/21 0401  AST 23 29 49*  ALT 23 32 49*  ALKPHOS 92 96 113  BILITOT 0.5 0.7 0.6  PROT 6.2* 6.0* 5.6*  ALBUMIN 2.9* 2.7* 2.5*    Recent Labs  Lab 07/05/21 0924  LIPASE 38      HbA1C: Hgb A1c MFr Bld  Date/Time Value Ref Range Status  05/22/2021 03:15 PM 5.0 4.8 - 5.6 % Final    Comment:    (NOTE)         Prediabetes: 5.7 - 6.4         Diabetes: >6.4         Glycemic control for adults with diabetes: <7.0   10/28/2019 10:23 PM 4.7 (L) 4.8 - 5.6 %  Final    Comment:    (NOTE) Pre diabetes:          5.7%-6.4% Diabetes:              >6.4% Glycemic control for   <7.0% adults with diabetes     CBG: Recent Labs  Lab 06/30/21 1127 06/30/21 1612  GLUCAP 107* 123*     Recent Results (from the past 240 hour(s))  Blood culture (routine x 2)     Status: None (Preliminary result)   Collection Time: 07/05/21  8:45 AM   Specimen: BLOOD  Result Value Ref Range Status   Specimen Description   Final    BLOOD BLOOD RIGHT FOREARM Performed at [facility], [place] [address], [place], [place] [address]    Special Requests   Final    BOTTLES DRAWN AEROBIC AND ANAEROBIC Blood Culture results may not be optimal due to an inadequate volume of blood received in culture bottles Performed at [facility] [address] [place], [place] [address]    Culture   Final    NO GROWTH < 24 HOURS Performed at [facility] [address], [place], [place] [address]    Report Status PENDING  Incomplete  Blood culture (routine x 2)     Status: None (Preliminary result)   Collection Time: 07/05/21  9:10 AM   Specimen: BLOOD LEFT HAND  Result Value Ref Range Status   Specimen Description   Final    BLOOD LEFT HAND Performed at [facility] [address], [place], [place] [address]    Special Requests   Final    BOTTLES DRAWN AEROBIC AND ANAEROBIC Blood Culture adequate volume Performed at [facility] [address], [place], [place] [address]    Culture   Final    NO GROWTH < 24 HOURS Performed at [facility] [address] [place],  [address]    Report Status PENDING  Incomplete  Resp Panel by RT-PCR (Flu A&B, Covid) Nasopharyngeal Swab     Status: None   Collection Time: 07/05/21  9:24 AM   Specimen: Nasopharyngeal Swab; Nasopharyngeal(NP) swabs in vial transport medium  Result Value Ref Range Status   SARS Coronavirus 2 by RT PCR NEGATIVE  NEGATIVE Final    Comment: (NOTE) SARS-CoV-2 target nucleic acids are NOT DETECTED.  The SARS-CoV-2 RNA is generally detectable in upper respiratory specimens during the acute phase of infection. The lowest concentration of SARS-CoV-2 viral copies this assay can detect is 138 copies/mL.  A negative result does not preclude SARS-Cov-2 infection and should not be used as the sole basis for treatment or other patient management decisions. A negative result may occur with  improper specimen collection/handling, submission of specimen other than nasopharyngeal swab, presence of viral mutation(s) within the areas targeted by this assay, and inadequate number of viral copies(<138 copies/mL). A negative result must be combined with clinical observations, patient history, and epidemiological information. The expected result is Negative.  Fact Sheet for Patients:  EntrepreneurPulse.com.au  Fact Sheet for Healthcare Providers:  IncredibleEmployment.be  This test is no t yet approved or cleared by the [place] FDA and  has been authorized for detection and/or diagnosis of SARS-CoV-2 by FDA under an Emergency Use Authorization (EUA). This EUA will remain  in effect (meaning this test can be used) for the duration of the COVID-19 declaration under Section 564(b)(1) of the Act, 21 U.S.C.section 360bbb-3(b)(1), unless the authorization is terminated  or revoked sooner.       Influenza A by PCR NEGATIVE NEGATIVE Final   Influenza B by PCR NEGATIVE NEGATIVE Final    Comment: (NOTE) The Xpert Xpress SARS-CoV-2/FLU/RSV plus assay is intended as an aid in the diagnosis of influenza from Nasopharyngeal swab specimens and should not be used as a sole basis for treatment. Nasal washings and aspirates are unacceptable for Xpert Xpress SARS-CoV-2/FLU/RSV testing.  Fact Sheet for Patients: EntrepreneurPulse.com.au  Fact Sheet for Healthcare  Providers: IncredibleEmployment.be  This test is not yet approved or cleared by the [place] FDA and has been authorized for detection and/or diagnosis of SARS-CoV-2 by FDA under an Emergency Use Authorization (EUA). This EUA will remain in effect (meaning this test can be used) for the duration of the COVID-19 declaration under Section 564(b)(1) of the Act, 21 U.S.C. section 360bbb-3(b)(1), unless the authorization is terminated or revoked.  Performed at [facility], [place] [address], [place], [place] [address]       Scheduled Meds:  (feeding supplement) PROSource Plus  30 mL Oral BID BM   amLODipine  10 mg Oral Daily   budesonide  3 mg Oral Q3 days   buPROPion ER  100 mg Oral BID   calcium carbonate  1 tablet Oral Q breakfast   carvedilol  25 mg Oral BID WC   Chlorhexidine Gluconate Cloth  6 each Topical Daily   cholecalciferol  1,000 Units Oral Daily   cycloSPORINE  1 drop Both Eyes BID   DULoxetine  60 mg Oral BID   enoxaparin (LOVENOX) injection  30 mg Subcutaneous Q24H   hydrALAZINE  100 mg Oral TID   levETIRAcetam  500 mg Oral BID   lipase/protease/amylase  36,000 Units Oral TID AC   methadone  5 mg Oral 5 X Daily   pantoprazole  40 mg Oral Daily   vitamin B-12  1,000 mcg Oral Daily   Continuous Infusions:  meropenem (MERREM) IV 1 g (07/07/21 6812)   vancomycin 1,000 mg (07/06/21 1704)     LOS: 2 days   Cherene Altes, MD [facility] Office  [phone] Pager - Text Page per Shea Evans  If 7PM-7AM, please contact night-coverage per Amion 07/07/2021, 9:44 AM

## 2021-07-07 NOTE — Progress Notes (Signed)
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  Right femoral line last exchanged May 2022. Short gut syndrome secondary to Crohn's disease AKI on CKD History of discitis (culture negative) status post 6 weeks daptomycin/cefepime (completed April 2022)  PLAN:    Continue meropenem for now pending blood culture results Stop vancomycin as there has been no growth of gram-positive organisms If blood cultures turn positive, will need to discuss with IR regarding line exchange (ideally would have holiday but her vascular access may be prohibitive) If blood cultures are negative through the weekend, would resume ciprofloxacin IV and treat for 7 days total (8/10-8/17) given the concerns of having had poor absorption on oral Cipro prior to admission Available as needed over the weekend.  Dr Gale Journey will be here on Monday   Principal Problem:   Sepsis [facility]) Active Problems:   Crohn's disease of both small and large intestine with other complication ([place])   Short gut syndrome   On total parenteral nutrition (TPN)   Central line infection   Bacteremia associated with intravascular line ([name])   CKD (chronic kidney disease) stage 3, GFR 30-59 ml/min ([name])   Malnutrition of moderate degree    MEDICATIONS:    Scheduled Meds:  (feeding supplement) PROSource Plus  30 mL Oral BID BM   amLODipine  10 mg Oral Daily   budesonide  3 mg Oral Q3 days   buPROPion ER  100 mg Oral BID   calcium carbonate  1 tablet Oral Q breakfast   carvedilol  25 mg Oral BID WC   Chlorhexidine Gluconate Cloth  6 each Topical Daily   cholecalciferol  1,000 Units Oral Daily   cycloSPORINE  1 drop Both  Eyes BID   DULoxetine  60 mg Oral BID   enoxaparin (LOVENOX) injection  30 mg Subcutaneous Q24H   hydrALAZINE  100 mg Oral TID   levETIRAcetam  500 mg Oral BID   lipase/protease/amylase  36,000 Units Oral TID AC   methadone  5 mg Oral 5 X Daily   pantoprazole  40 mg Oral Daily   vitamin B-12  1,000 mcg Oral Daily   Continuous Infusions:  sodium chloride     dextrose 5 % and 0.9 % NaCl with KCl 20 mEq/L     meropenem (MERREM) IV 1 g (07/07/21 0619)   TPN ADULT (ION)     PRN Meds:.famotidine, HYDROmorphone (DILAUDID) injection, promethazine, sodium chloride flush  SUBJECTIVE:   24 hour events:  No acute events noted WBC 1.6, decreased from 2.3 Creatinine 1.8, increased from 1.3 Blood cultures 8/10 remain no growth Afebrile, T-max 99.6  No acute complaints.  She reports feeling better today.  Denies fevers currently.  Concerned regarding her kidney function and vancomycin.  Review of Systems  All other systems reviewed and are negative.    OBJECTIVE:   Blood pressure 103/69, pulse 73, temperature 98.7 F (37.1 C), temperature source Oral, resp. rate 18, height 5' 8"  (1.727 m), weight 59.3 kg, SpO2 99 %. Body mass index is 19.88 kg/m.  Physical Exam Constitutional:      General: She is not in acute distress.    Appearance: Normal  appearance.  HENT:     Head: Normocephalic and atraumatic.  Pulmonary:     Effort: Pulmonary effort is normal. No respiratory distress.  Skin:    General: Skin is warm and dry.     Findings: No rash.     Comments: Right femoral PICC line  Neurological:     General: No focal deficit present.     Mental Status: She is alert and oriented to person, place, and time.  Psychiatric:        Mood and Affect: Mood normal.        Behavior: Behavior normal.     Lab Results: Lab Results  Component Value Date   WBC 1.6 (L) 07/07/2021   HGB 8.8 (L) 07/07/2021   HCT 27.0 (L) 07/07/2021   MCV 92.2 07/07/2021   PLT 120 (L) 07/07/2021    Lab  Results  Component Value Date   NA 134 (L) 07/07/2021   K 3.3 (L) 07/07/2021   CO2 27 07/07/2021   GLUCOSE 119 (H) 07/07/2021   BUN 32 (H) 07/07/2021   CREATININE 1.89 (H) 07/07/2021   CALCIUM 9.0 07/07/2021   GFRNONAA 30 (L) 07/07/2021   GFRAA 55 (L) 08/12/2020    Lab Results  Component Value Date   ALT 49 (H) 07/07/2021   AST 49 (H) 07/07/2021   ALKPHOS 113 07/07/2021   BILITOT 0.6 07/07/2021       Component Value Date/Time   CRP 2.8 (H) 07/05/2021 2138       Component Value Date/Time   ESRSEDRATE 33 (H) 07/05/2021 2138     I have reviewed the micro and lab results in Epic.  Imaging: No results found.   Imaging independently reviewed in Epic.    [name] for Infectious Disease [facility] [phone] pager 07/07/2021, 3:27 PM  I spent greater than 35 minutes with the patient including greater than 50% of time in face to face counsel of the patient and in coordination of their care.

## 2021-07-07 NOTE — Progress Notes (Signed)
PHARMACY - TOTAL PARENTERAL NUTRITION CONSULT NOTE   Indication:  short gut syndrome, chronic TPN at home  Patient Measurements: Height: 5' 8"  (172.7 cm) Weight: 59.3 kg (130 lb 11.7 oz) IBW/kg (Calculated) : 63.9 TPN AdjBW (KG): 54.4 Body mass index is 19.88 kg/m.   Recent Labs    07/06/21 0646 07/07/21 0401  NA 137 134*  K 3.6 3.3*  CL 99 100  CO2 27 27  GLUCOSE 97 119*  BUN 22* 32*  CREATININE 1.27* 1.89*  CALCIUM 9.3 9.0  PHOS  --  4.5  MG  --  1.8  ALBUMIN 2.7* 2.5*  ALKPHOS 96 113  AST 29 49*  ALT 32 49*  BILITOT 0.7 0.6       Assessment: 60 year old female on chronic TPN followed by [facility] for short gut syndrome.  Patient is on cyclic TPN over 12 hours at home.  Of note, previous admission in June 2022- TPN was held for a short period of time due to elevated liver enzymes.  TPN resumed on 7/27 and d/ced on 7/28 d/t bacteremia then resumed TPN on 06/28/21. She was re-admitted 10/10 with low grade temp & TPN was held on admission. 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, Crohn's s/p multiple SBR's, short gut syndrome, chronic diarrhea, anasarca, h/o diverticulosis, B12 deficiency, GERD  Glucose / Insulin: no hx DM, no insulin in home TPN All serum glucose < 150 Electrolytes: K 3.4> MD ordered 3 runs K; Mag 1.8> 2 gm per MD; Na 134; phos 4.5, CoCa 10.2 Renal: PMH CKD, SCr 1.27> 1.89 Hepatic:    AST/ALT sl elevated -  prealbumin 22.9 (7/27), 24.2 (8/3) - albumin 2.5 - TG 85 (8/3) Intake / Output; MIVF: D5NS 20 K at 75 ml/hr - I/O: = 193.5, urine x 1, stool x1  GI Imaging: 8/10 CT A/P: Unchanged mild wall thickening of the descending colon, no additional areas seen, no SBO, no abscess GI Surgeries / Procedures: n/a Ordered GI Medications: Creon 36,000 units TID w/ meals,  PPI po qday,  pepcid prn  Diet: Regular Oral nutritional supplements: Prosouce plus 30 ml BID in between meals Central access: PICC line placed 04/03/21 TPN start  date: on TPN PTA  Nutritional Goals (Dietician's recom on 8/11): Kcal:  1800-2000 kcal Protein:  90-105 grams Fluid:  > 1.8 L day  Goal TPN rate is 75 mL/hr (provides 90 g of protein and 1818 kcals per day)  PTA TPN Rx (see media tab for Rx image): total 1088 kCal (w/o Lipids) and 1688 kcal w/ lipids; 340 kcal AA, 748 kcal CHO -Formula w/o lipids 4 days/week- receives SMOFlipid 60g on MWF. Adds Infuvite 10 mL and Tralement 53m to each bag daily.  -Dextrose 70%= 220g/day; Plenamine 15% = 85g/day; Kphos 24 mmol/day; K acetate 57 mEq/day; Mg 10 mEq/day; CaGlu 10 mEq/day; Na Acetate 76 mEq/day - Na = 83 mEq/L  - K = 51 mEq/L - Ca= 5.5 mEq/L - Mag = 5.5 mEq/L - Phos = 13 mmol/L - Cl:Ac = 1:3  ++ Will dose inpatient TPN using dietitian's recom. for nutritional goals ++  Current Nutrition:  - Prosource plus bid - on regular diet    Plan:  At 1800:  - start TPN at 75 ml/hr to provide 90 gm protein & 1818 kcal for 100% support - Electrolytes in TPN: Na 831m/L, K 5035mL, Ca 3mE68m, Mg 5mEq54m and Phos 15mmo23m Cl:Ac--> 1:1 - Add standard MVI and trace elements to TPN  -  no SSI  - MIVF:  @ 1800 when TPN starts change D5NS K20 @ 75 ml/hr to NS 10 ml/hr or per MD - Monitor TPN labs on Mon/Thurs and as needed  Eudelia Bunch, Pharm.D 07/07/2021 11:22 AM

## 2021-07-07 NOTE — Plan of Care (Signed)

## 2021-07-08 ENCOUNTER — Inpatient Hospital Stay (HOSPITAL_COMMUNITY): Payer: Medicare Other

## 2021-07-08 DIAGNOSIS — N179 Acute kidney failure, unspecified: Secondary | ICD-10-CM

## 2021-07-08 DIAGNOSIS — Z789 Other specified health status: Secondary | ICD-10-CM | POA: Diagnosis not present

## 2021-07-08 DIAGNOSIS — E440 Moderate protein-calorie malnutrition: Secondary | ICD-10-CM | POA: Diagnosis not present

## 2021-07-08 DIAGNOSIS — G894 Chronic pain syndrome: Secondary | ICD-10-CM

## 2021-07-08 DIAGNOSIS — R7989 Other specified abnormal findings of blood chemistry: Secondary | ICD-10-CM

## 2021-07-08 DIAGNOSIS — Z20822 Contact with and (suspected) exposure to covid-19: Secondary | ICD-10-CM | POA: Diagnosis not present

## 2021-07-08 DIAGNOSIS — R509 Fever, unspecified: Secondary | ICD-10-CM | POA: Diagnosis not present

## 2021-07-08 DIAGNOSIS — T80219D Unspecified infection due to central venous catheter, subsequent encounter: Secondary | ICD-10-CM

## 2021-07-08 DIAGNOSIS — E871 Hypo-osmolality and hyponatremia: Secondary | ICD-10-CM

## 2021-07-08 DIAGNOSIS — A419 Sepsis, unspecified organism: Secondary | ICD-10-CM | POA: Diagnosis not present

## 2021-07-08 DIAGNOSIS — K912 Postsurgical malabsorption, not elsewhere classified: Secondary | ICD-10-CM

## 2021-07-08 DIAGNOSIS — B377 Candidal sepsis: Secondary | ICD-10-CM | POA: Diagnosis not present

## 2021-07-08 DIAGNOSIS — R6520 Severe sepsis without septic shock: Secondary | ICD-10-CM | POA: Diagnosis not present

## 2021-07-08 DIAGNOSIS — T80211A Bloodstream infection due to central venous catheter, initial encounter: Secondary | ICD-10-CM | POA: Diagnosis not present

## 2021-07-08 LAB — COMPREHENSIVE METABOLIC PANEL
ALT: 61 U/L — ABNORMAL HIGH (ref 0–44)
AST: 52 U/L — ABNORMAL HIGH (ref 15–41)
Albumin: 2.3 g/dL — ABNORMAL LOW (ref 3.5–5.0)
Alkaline Phosphatase: 102 U/L (ref 38–126)
Anion gap: 7 (ref 5–15)
BUN: 40 mg/dL — ABNORMAL HIGH (ref 6–20)
CO2: 27 mmol/L (ref 22–32)
Calcium: 8.9 mg/dL (ref 8.9–10.3)
Chloride: 100 mmol/L (ref 98–111)
Creatinine, Ser: 1.86 mg/dL — ABNORMAL HIGH (ref 0.44–1.00)
GFR, Estimated: 31 mL/min — ABNORMAL LOW (ref >60)
Glucose, Bld: 123 mg/dL — ABNORMAL HIGH (ref 70–99)
Potassium: 3.5 mmol/L (ref 3.5–5.1)
Sodium: 134 mmol/L — ABNORMAL LOW (ref 135–145)
Total Bilirubin: 0.4 mg/dL (ref 0.3–1.2)
Total Protein: 5.3 g/dL — ABNORMAL LOW (ref 6.5–8.1)

## 2021-07-08 LAB — LACTIC ACID, PLASMA
Lactic Acid, Venous: 1 mmol/L (ref 0.5–1.9)
Lactic Acid, Venous: 0.9 mmol/L (ref 0.5–1.9)

## 2021-07-08 LAB — MAGNESIUM: Magnesium: 2.4 mg/dL (ref 1.7–2.4)

## 2021-07-08 LAB — TRIGLYCERIDES: Triglycerides: 71 mg/dL (ref <150)

## 2021-07-08 LAB — CK: Total CK: 16 U/L — ABNORMAL LOW (ref 38–234)

## 2021-07-08 LAB — PHOSPHORUS: Phosphorus: 5.1 mg/dL — ABNORMAL HIGH (ref 2.5–4.6)

## 2021-07-08 MED ORDER — ACETAMINOPHEN 325 MG PO TABS
650.0000 mg | ORAL_TABLET | Freq: Once | ORAL | Status: AC
  Administered 2021-07-08: 650 mg via ORAL
  Filled 2021-07-08: qty 2

## 2021-07-08 MED ORDER — HYDROMORPHONE HCL 1 MG/ML IJ SOLN: 0.5000 mg | INTRAMUSCULAR | Status: DC | PRN

## 2021-07-08 MED ORDER — ACETAMINOPHEN 500 MG PO TABS
500.0000 mg | ORAL_TABLET | Freq: Four times a day (QID) | ORAL | Status: DC | PRN
  Administered 2021-07-08 – 2021-07-09 (×3): 500 mg via ORAL
  Filled 2021-07-08 (×3): qty 1

## 2021-07-08 MED ORDER — ONDANSETRON HCL 4 MG/2ML IJ SOLN
4.0000 mg | Freq: Four times a day (QID) | INTRAMUSCULAR | Status: DC | PRN
  Administered 2021-07-08 – 2021-07-13 (×7): 4 mg via INTRAVENOUS
  Filled 2021-07-08 (×8): qty 2

## 2021-07-08 MED ORDER — NAPHAZOLINE-GLYCERIN 0.012-0.25 % OP SOLN
1.0000 [drp] | Freq: Four times a day (QID) | OPHTHALMIC | Status: DC | PRN
  Administered 2021-07-08: 1 [drp] via OPHTHALMIC
  Filled 2021-07-08: qty 15

## 2021-07-08 MED ORDER — HYDRALAZINE HCL 20 MG/ML IJ SOLN
10.0000 mg | Freq: Once | INTRAMUSCULAR | Status: AC
  Administered 2021-07-08: 10 mg via INTRAVENOUS
  Filled 2021-07-08: qty 1

## 2021-07-08 MED ORDER — SODIUM CHLORIDE 0.9 % IV BOLUS
500.0000 mL | Freq: Once | INTRAVENOUS | Status: AC
  Administered 2021-07-08: 500 mL via INTRAVENOUS

## 2021-07-08 MED ORDER — SODIUM CHLORIDE 0.9 % IV SOLN: INTRAVENOUS | Status: AC

## 2021-07-08 MED ORDER — HYDROMORPHONE HCL 1 MG/ML IJ SOLN
1.0000 mg | INTRAMUSCULAR | Status: DC | PRN
  Administered 2021-07-08 – 2021-07-14 (×30): 1 mg via INTRAVENOUS
  Filled 2021-07-08 (×30): qty 1

## 2021-07-08 MED ORDER — METOCLOPRAMIDE HCL 5 MG/ML IJ SOLN
5.0000 mg | Freq: Three times a day (TID) | INTRAMUSCULAR | Status: DC | PRN
  Administered 2021-07-08 – 2021-07-09 (×3): 5 mg via INTRAVENOUS
  Filled 2021-07-08 (×3): qty 2

## 2021-07-08 MED ORDER — TRAVASOL 10 % IV SOLN
INTRAVENOUS | Status: AC
  Filled 2021-07-08: qty 900

## 2021-07-08 MED ORDER — DIPHENHYDRAMINE HCL 25 MG PO CAPS: 25.0000 mg | ORAL_CAPSULE | Freq: Three times a day (TID) | ORAL | Status: DC | PRN

## 2021-07-08 MED ORDER — POTASSIUM CHLORIDE 10 MEQ/50ML IV SOLN
10.0000 meq | INTRAVENOUS | Status: AC
Start: 2021-07-08 — End: 2021-07-08
  Administered 2021-07-08 (×3): 10 meq via INTRAVENOUS
  Filled 2021-07-08 (×3): qty 50

## 2021-07-08 NOTE — Progress Notes (Signed)
PROGRESS NOTE  Caitlin Peters LGX:[id number] DOB: 05/11/1961   PCP: [name], [name], MD  Patient is from: Home.  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  ID consulted.  Started on IV meropenem and vancomycin.  The latter was discontinued per ID recommendation.  ID recommended IV Cipro through 8/17, if blood cultures negative through the weekend.  Blood cultures NGTD.  Subjective: Seen and examined earlier this morning.  No major events overnight of this morning.  She complains of left arm pain.  This is not accurate.  She says she had the same pain when she was here last months.  She rates her pain 6/10.  No swelling or erythema.  Fair range of motion.  Denies chest pain or dyspnea.  Objective: Vitals:   07/07/21 1401 07/07/21 2037 07/08/21 0509 07/08/21 1342  BP: 103/69 116/62 138/67 (!) 150/69  Pulse: 73 (!) 56 64   Resp: 18 16 15    Temp: 98.7 F (37.1 C) 98.5 F (36.9 C) 98.3 F (36.8 C) 98.7 F (37.1 C)  TempSrc: Oral Oral Oral Oral  SpO2: 99% 100% 99%   Weight:   59.2 kg   Height:        Intake/Output Summary (Last 24 hours) at 07/08/2021 1538 Last data filed at 07/08/2021 1227 Gross per 24 hour  Intake 1859.75 ml  Output --  Net 1859.75 ml   Filed Weights   07/06/21 0500 07/07/21 0500 07/08/21 0509  Weight: 60.5 kg 59.3 kg 59.2 kg    Examination:  GENERAL: Frail and chronically ill-appearing.  Nontoxic. HEENT: MMM.  Vision and hearing grossly intact.  NECK: Supple.  No apparent JVD.   RESP: On RA.  No IWOB.  Fair aeration bilaterally. CVS:  RRR. Heart sounds normal.  ABD/GI/GU: BS+. Abd soft, NTND.  MSK/EXT:  Moves extremities.  Significant muscle mass and subcu fat loss.  No focal tenderness in the left arm.  Full range of motion in her shoulders and elbows. SKIN: no apparent skin lesion or wound.  No skin erythema or signs of infection around right femoral PICC line NEURO: Awake, alert and oriented appropriately.  No apparent focal neuro deficit. PSYCH: Calm. Normal affect.   Procedures:  None  Microbiology summarized: CXKGY-18 and influenza PCR nonreactive. Blood cultures NGTD.  Assessment & Plan: FUO: Concern about CVL infection but blood cultures NGTD.  Rt femoral line last exchangedin 03/2021 History of Acinetobacter bacteremia-felt to be due to line infection. -Appreciate recommendation by ID -IV meropenem through the weekend, and transition to IV ciprofloxacin through 8/17 if cultures remain negative    Short gut syndrome - complex hx of Crohn's  TPN dependent at home - resume TPN per pharmacy -Home TPN per pharmacy  AKI on CKD-3A/azotemia: Cr seems to be plateauing. Recent Labs    06/25/21 0325 06/26/21 0410 06/27/21 0340 06/28/21 0422 06/29/21 0450 06/30/21 0350 07/05/21 0924 07/06/21 0646 07/07/21 0401 07/08/21 0500  BUN 14 12 11 13 20  26* 36* 22* 32* 40*  CREATININE 1.40* 1.26* 1.21* 1.29* 1.68* 1.56* 1.42*  1.27* 1.89* 1.86*  -Recheck in the morning -Avoid nephrotoxic meds  Mild transaminitis: Due to TPN?  Relatively stable.  Bili and ALP within normal.  -Monitor   Hypokalemia: Pharmacy replacing with TPN.  Hyponatremia: Likely hypovolemic. -Continue monitoring  Seizure disorder: Stable. -Cont usual keppra dose    Essential HTN: Normotensive. -Continue amlodipine   Chronic pain syndrome: On p.o. Dilaudid, methadone and Robaxin at home -Continue home methadone, IV Dilaudid and p.o. Robaxin -Transition to home p.o. Dilaudid in  the morning  Nausea without emesis -Continue p.o. promethazine as needed -Added IV Reglan for refractory nausea and vomiting -Twelve-lead EKG to monitor QT  Mood disorder: -Continue home Cymbalta  Moderate malnutrition Body mass index is 19.84 kg/m. Nutrition Problem: Moderate Malnutrition Etiology: chronic illness Signs/Symptoms: mild fat depletion, mild muscle depletion Interventions: Prostat, TPN   DVT prophylaxis:  enoxaparin (LOVENOX) injection 30 mg Start: 07/07/21 2200 Place TED hose Start: 07/05/21 2202  Code Status: Full code Family Communication: Patient and/or RN. Available if any question.  Level of care: Med-Surg Status is: Inpatient  Remains inpatient appropriate because:IV treatments appropriate due to intensity of illness or inability to take PO and Inpatient level of care appropriate due to severity of illness  Dispo: The patient is from: Home              Anticipated d/c is to: Home              Patient currently is not medically stable to d/c.   Difficult to place patient No       Consultants:  Infectious disease   Sch Meds:  Scheduled Meds:  (feeding supplement) PROSource Plus  30 mL Oral BID BM   amLODipine  10 mg Oral Daily   budesonide  3 mg Oral Q3 days   buPROPion ER  100 mg Oral BID   calcium carbonate  1 tablet Oral Q breakfast   carvedilol  25 mg Oral BID WC   Chlorhexidine Gluconate Cloth  6 each Topical Daily   cholecalciferol  1,000 Units Oral Daily   cycloSPORINE  1 drop Both Eyes BID   DULoxetine  60 mg Oral BID   enoxaparin (LOVENOX) injection  30 mg Subcutaneous Q24H   hydrALAZINE  100 mg Oral TID   levETIRAcetam  500 mg Oral BID   lipase/protease/amylase  36,000 Units Oral TID AC   methadone  5 mg Oral 5 X Daily   pantoprazole  40 mg Oral Daily   vitamin B-12  1,000 mcg Oral Daily   Continuous Infusions:  sodium chloride 10 mL/hr at 07/07/21 2008   meropenem (MERREM) IV 1 g (07/08/21 0624)   TPN ADULT (ION) 75 mL/hr  at 07/07/21 1804   TPN ADULT (ION)     PRN Meds:.acetaminophen, famotidine, HYDROmorphone (DILAUDID) injection, metoCLOPramide (REGLAN) injection, naphazoline-glycerin, promethazine, sodium chloride flush  Antimicrobials: Anti-infectives (From admission, onward)    Start     Dose/Rate Route Frequency Ordered Stop   07/06/21 1800  vancomycin (VANCOREADY) IVPB 750 mg/150 mL  Status:  Discontinued        750 mg 150 mL/hr over 60 Minutes Intravenous Every 24 hours 07/05/21 1631 07/06/21 0842   07/06/21 1400  vancomycin (VANCOCIN) IVPB 1000 mg/200 mL premix  Status:  Discontinued        1,000 mg 200 mL/hr over 60 Minutes Intravenous Every 24 hours 07/06/21 0842 07/07/21 1310   07/05/21 1700  meropenem (MERREM) 1 g in sodium chloride 0.9 % 100 mL IVPB  1 g 200 mL/hr over 30 Minutes Intravenous Every 12 hours 07/05/21 1608     07/05/21 1700  vancomycin (VANCOREADY) IVPB 1250 mg/250 mL        1,250 mg 166.7 mL/hr over 90 Minutes Intravenous  Once 07/05/21 1630 07/05/21 1942        I have personally reviewed the following labs and images: CBC: Recent Labs  Lab 07/05/21 0924 07/06/21 0646 07/07/21 0401  WBC 2.8* 2.3* 1.6*  NEUTROABS 2.1  --   --   HGB 8.3* 8.9* 8.8*  HCT 25.1* 26.9* 27.0*  MCV 91.9 91.2 92.2  PLT 137* 124* 120*   BMP &GFR Recent Labs  Lab 07/05/21 0924 07/06/21 0646 07/07/21 0401 07/08/21 0500  NA 138 137 134* 134*  K 3.9 3.6 3.3* 3.5  CL 100 99 100 100  CO2 27 27 27 27   GLUCOSE 107* 97 119* 123*  BUN 36* 22* 32* 40*  CREATININE 1.42* 1.27* 1.89* 1.86*  CALCIUM 9.3 9.3 9.0 8.9  MG  --   --  1.8 2.4  PHOS  --   --  4.5 5.1*   Estimated Creatinine Clearance: 30.1 mL/min (A) (by C-G formula based on SCr of 1.86 mg/dL (H)). Liver & Pancreas: Recent Labs  Lab 07/05/21 0924 07/06/21 0646 07/07/21 0401 07/08/21 0500  AST 23 29 49* 52*  ALT 23 32 49* 61*  ALKPHOS 92 96 113 102  BILITOT 0.5 0.7 0.6 0.4  PROT 6.2* 6.0* 5.6* 5.3*  ALBUMIN 2.9*  2.7* 2.5* 2.3*   Recent Labs  Lab 07/05/21 0924  LIPASE 38   No results for input(s): AMMONIA in the last 168 hours. Diabetic: No results for input(s): HGBA1C in the last 72 hours. No results for input(s): GLUCAP in the last 168 hours. Cardiac Enzymes: No results for input(s): CKTOTAL, CKMB, CKMBINDEX, TROPONINI in the last 168 hours. No results for input(s): PROBNP in the last 8760 hours. Coagulation Profile: No results for input(s): INR, PROTIME in the last 168 hours. Thyroid Function Tests: Recent Labs    07/05/21 2202  TSH 0.285*   Lipid Profile: Recent Labs    07/08/21 0500  TRIG 71   Anemia Panel: No results for input(s): VITAMINB12, FOLATE, FERRITIN, TIBC, IRON, RETICCTPCT in the last 72 hours. Urine analysis:    Component Value Date/Time   COLORURINE YELLOW 06/19/2021 2145   APPEARANCEUR CLEAR 06/19/2021 2145   LABSPEC 1.015 06/19/2021 2145   PHURINE 6.0 06/19/2021 2145   GLUCOSEU NEGATIVE 06/19/2021 2145   HGBUR NEGATIVE 06/19/2021 2145   BILIRUBINUR NEGATIVE 06/19/2021 2145   [place] NEGATIVE 06/19/2021 2145   PROTEINUR 100 (A) 06/19/2021 2145   NITRITE NEGATIVE 06/19/2021 2145   LEUKOCYTESUR NEGATIVE 06/19/2021 2145   Sepsis Labs: Invalid input(s): PROCALCITONIN, [place]  Microbiology: Recent Results (from the past 240 hour(s))  Blood culture (routine x 2)     Status: None (Preliminary result)   Collection Time: 07/05/21  8:45 AM   Specimen: BLOOD  Result Value Ref Range Status   Specimen Description   Final    BLOOD BLOOD RIGHT FOREARM Performed at [facility] [address] [place], [place] [address]    Special Requests   Final    BOTTLES DRAWN AEROBIC AND ANAEROBIC Blood Culture results may not be optimal due to an inadequate volume of blood received in culture bottles Performed at [facility] [address], [place], [place] [address]    Culture   Final    NO GROWTH  3 DAYS Performed at  [facility], [place] [address] [place], [place] [address]    Report Status PENDING  Incomplete  Blood culture (routine x 2)     Status: None (Preliminary result)   Collection Time: 07/05/21  9:10 AM   Specimen: BLOOD LEFT HAND  Result Value Ref Range Status   Specimen Description   Final    BLOOD LEFT HAND Performed at [facility] [address] [place], [place] [address]    Special Requests   Final    BOTTLES DRAWN AEROBIC AND ANAEROBIC Blood Culture adequate volume Performed at [facility] [address], [place], [place] [address]    Culture   Final    NO GROWTH 3 DAYS Performed at [facility], [place] [address] [place], [place] [address]    Report Status PENDING  Incomplete  Resp Panel by RT-PCR (Flu A&B, Covid) Nasopharyngeal Swab     Status: None   Collection Time: 07/05/21  9:24 AM   Specimen: Nasopharyngeal Swab; Nasopharyngeal(NP) swabs in vial transport medium  Result Value Ref Range Status   SARS Coronavirus 2 by RT PCR NEGATIVE NEGATIVE Final    Comment: (NOTE) SARS-CoV-2 target nucleic acids are NOT DETECTED.  The SARS-CoV-2 RNA is generally detectable in upper respiratory specimens during the acute phase of infection. The lowest concentration of SARS-CoV-2 viral copies this assay can detect is 138 copies/mL. A negative result does not preclude SARS-Cov-2 infection and should not be used as the sole basis for treatment or other patient management decisions. A negative result may occur with  improper specimen collection/handling, submission of specimen other than nasopharyngeal swab, presence of viral mutation(s) within the areas targeted by this assay, and inadequate number of viral copies(<138 copies/mL). A negative result must be combined with clinical observations, patient history, and epidemiological information. The expected result is Negative.  Fact Sheet for Patients:   EntrepreneurPulse.com.au  Fact Sheet for Healthcare Providers:  IncredibleEmployment.be  This test is no t yet approved or cleared by the [place] FDA and  has been authorized for detection and/or diagnosis of SARS-CoV-2 by FDA under an Emergency Use Authorization (EUA). This EUA will remain  in effect (meaning this test can be used) for the duration of the COVID-19 declaration under Section 564(b)(1) of the Act, 21 U.S.C.section 360bbb-3(b)(1), unless the authorization is terminated  or revoked sooner.       Influenza A by PCR NEGATIVE NEGATIVE Final   Influenza B by PCR NEGATIVE NEGATIVE Final    Comment: (NOTE) The Xpert Xpress SARS-CoV-2/FLU/RSV plus assay is intended as an aid in the diagnosis of influenza from Nasopharyngeal swab specimens and should not be used as a sole basis for treatment. Nasal washings and aspirates are unacceptable for Xpert Xpress SARS-CoV-2/FLU/RSV testing.  Fact Sheet for Patients: EntrepreneurPulse.com.au  Fact Sheet for Healthcare Providers: IncredibleEmployment.be  This test is not yet approved or cleared by the [place] FDA and has been authorized for detection and/or diagnosis of SARS-CoV-2 by FDA under an Emergency Use Authorization (EUA). This EUA will remain in effect (meaning this test can be used) for the duration of the COVID-19 declaration under Section 564(b)(1) of the Act, 21 U.S.C. section 360bbb-3(b)(1), unless the authorization is terminated or revoked.  Performed at [facility], [place] [address], [place], [place] [address]     Radiology Studies: No results found.    Genise Strack T. [place]  If 7PM-7AM, please contact night-coverage  [email or web address] 07/08/2021, 3:38 PM

## 2021-07-08 NOTE — Progress Notes (Signed)
PHARMACY - TOTAL PARENTERAL NUTRITION CONSULT NOTE   Indication:  short gut syndrome, chronic TPN at home  Patient Measurements: Height: 5' 8"  (172.7 cm) Weight: 59.2 kg (130 lb 8.2 oz) IBW/kg (Calculated) : 63.9 TPN AdjBW (KG): 54.4 Body mass index is 19.84 kg/m.   Recent Labs    07/07/21 0401 07/08/21 0500  NA 134* 134*  K 3.3* 3.5  CL 100 100  CO2 27 27  GLUCOSE 119* 123*  BUN 32* 40*  CREATININE 1.89* 1.86*  CALCIUM 9.0 8.9  PHOS 4.5 5.1*  MG 1.8 2.4  ALBUMIN 2.5* 2.3*  ALKPHOS 113 102  AST 49* 52*  ALT 49* 61*  BILITOT 0.6 0.4  TRIG  --  71       Assessment: 60 year old female on chronic TPN followed by [facility] for short gut syndrome.  Patient is on cyclic TPN over 12 hours at home.  Of note, previous admission in June 2022- TPN was held for a short period of time due to elevated liver enzymes.  TPN resumed on 7/27 and d/ced on 7/28 d/t bacteremia then resumed TPN on 06/28/21. She was re-admitted 10/10 with low grade temp & TPN was held on admission. 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, Crohn's s/p multiple SBR's, short gut syndrome, chronic diarrhea, anasarca, h/o diverticulosis, B12 deficiency, GERD  Glucose / Insulin: no hx DM, no insulin in home TPN All serum glucose < 150 Electrolytes: K 3.5 after 3 runs yesterday; Mag 2.4 after 2 gm Mag yesterday; Na 134; phos slightly elevated at 5.1 , CoCa 10.26 Renal: PMH CKD, SCr 1.27> 1.89>1.86 Hepatic:    AST/ALT sl elevated -  prealbumin 22.9 (7/27), 24.2 (8/3) - albumin 2.3 - TG 71 (8/13) Intake / Output; MIVF:  NS 10 ml/hr  I/O: = + 1619.7 ml, urine x 1, stool x1  GI Imaging: 8/10 CT A/P: Unchanged mild wall thickening of the descending colon, no additional areas seen, no SBO, no abscess GI Surgeries / Procedures: n/a Ordered GI Medications: Creon 36,000 units TID w/ meals,  PPI po qday,  pepcid prn  Diet: Regular Oral nutritional supplements: Prosouce plus 30 ml BID in between  meals Central access: PICC line placed 04/03/21 TPN start date: on TPN PTA  Nutritional Goals (Dietician's recom on 8/11): Kcal:  1800-2000 kcal Protein:  90-105 grams Fluid:  > 1.8 L day  Goal TPN rate is 75 mL/hr (provides 90 g of protein and 1818 kcals per day)  PTA TPN Rx (see media tab for Rx image): total 1088 kCal (w/o Lipids) and 1688 kcal w/ lipids; 340 kcal AA, 748 kcal CHO -Formula w/o lipids 4 days/week- receives SMOFlipid 60g on MWF. Adds Infuvite 10 mL and Tralement 47m to each bag daily.  -Dextrose 70%= 220g/day; Plenamine 15% = 85g/day; Kphos 24 mmol/day; K acetate 57 mEq/day; Mg 10 mEq/day; CaGlu 10 mEq/day; Na Acetate 76 mEq/day - Na = 83 mEq/L  - K = 51 mEq/L - Ca= 5.5 mEq/L - Mag = 5.5 mEq/L - Phos = 13 mmol/L - Cl:Ac = 1:3  ++ Will dose inpatient TPN using dietitian's recom. for nutritional goals ++  Current Nutrition:  - Prosource plus bid - on regular diet  - TPN at 75 ml/hr   Plan:  3 runs KCL now continue TPN at 75 ml/hr to provide 90 gm protein & 1818 kcal for 100% support - Electrolytes in TPN: Na 992m/L, K 5075mL, Ca 2 mEq/L, Mg 5mE23m, and Phos  3 mmol/L. Cl:Ac--> 1:1 - Add standard MVI and trace elements to TPN  - no SSI  - MIVF:  NS 10 ml/hr or per MD - Monitor TPN labs on Mon/Thurs, BMET, Mg & Phos in AM  Eudelia Bunch, Pharm.D 07/08/2021 8:54 AM

## 2021-07-08 NOTE — Plan of Care (Signed)

## 2021-07-08 NOTE — Progress Notes (Signed)
   07/08/21 1900  Assess: MEWS Score  Temp (!) 103.3 F (39.6 C)  BP (!) 176/68  Pulse Rate 95  ECG Heart Rate 95  Resp (!) 22  Level of Consciousness Alert  SpO2 93 %  O2 Device Room Air  Assess: MEWS Score  MEWS Temp 2  MEWS Systolic 0  MEWS Pulse 0  MEWS RR 1  MEWS LOC 0  MEWS Score 3  MEWS Score Color Yellow  Assess: if the MEWS score is Yellow or Red  Were vital signs taken at a resting state? Yes  Focused Assessment No change from prior assessment  Does the patient meet 2 or more of the SIRS criteria? No  MEWS guidelines implemented *See Row Information* Yes  Take Vital Signs  Increase Vital Sign Frequency  Yellow: Q 2hr X 2 then Q 4hr X 2, if remains yellow, continue Q 4hrs  Escalate  MEWS: Escalate Yellow: discuss with charge nurse/RN and consider discussing with provider and RRT  Notify: Charge Nurse/RN  Name of Charge Nurse/RN Notified Myriam Jacobson  Date Charge Nurse/RN Notified 07/08/21  Time Charge Nurse/RN Notified 1900  Notify: Provider  Provider Name/Title [facility]ount  Date Provider Notified 07/08/21  Time Provider Notified 1900  Notification Type Page  Notification Reason Change in status  Provider response See new orders  Date of Provider Response 07/08/21  Time of Provider Response 1910  Notify: Rapid Response  Name of Rapid Response RN Notified christian  Date Rapid Response Notified 07/08/21  Time Rapid Response Notified 1900  Assess: SIRS CRITERIA  SIRS Temperature  1  SIRS Pulse 1  SIRS Respirations  1  SIRS WBC 0  SIRS Score Sum  3

## 2021-07-09 DIAGNOSIS — T80211A Bloodstream infection due to central venous catheter, initial encounter: Secondary | ICD-10-CM | POA: Diagnosis not present

## 2021-07-09 DIAGNOSIS — E440 Moderate protein-calorie malnutrition: Secondary | ICD-10-CM | POA: Diagnosis not present

## 2021-07-09 DIAGNOSIS — D61818 Other pancytopenia: Secondary | ICD-10-CM

## 2021-07-09 DIAGNOSIS — T80219D Unspecified infection due to central venous catheter, subsequent encounter: Secondary | ICD-10-CM | POA: Diagnosis not present

## 2021-07-09 DIAGNOSIS — Z789 Other specified health status: Secondary | ICD-10-CM | POA: Diagnosis not present

## 2021-07-09 DIAGNOSIS — A419 Sepsis, unspecified organism: Secondary | ICD-10-CM | POA: Diagnosis not present

## 2021-07-09 DIAGNOSIS — R509 Fever, unspecified: Secondary | ICD-10-CM | POA: Diagnosis not present

## 2021-07-09 LAB — CBC
HCT: 24.3 % — ABNORMAL LOW (ref 36.0–46.0)
Hemoglobin: 7.8 g/dL — ABNORMAL LOW (ref 12.0–15.0)
MCH: 29.9 pg (ref 26.0–34.0)
MCHC: 32.1 g/dL (ref 30.0–36.0)
MCV: 93.1 fL (ref 80.0–100.0)
Platelets: 81 10*3/uL — ABNORMAL LOW (ref 150–400)
RBC: 2.61 MIL/uL — ABNORMAL LOW (ref 3.87–5.11)
RDW: 13.3 % (ref 11.5–15.5)
WBC: 2.4 10*3/uL — ABNORMAL LOW (ref 4.0–10.5)
nRBC: 0 % (ref 0.0–0.2)

## 2021-07-09 LAB — RENAL FUNCTION PANEL
Albumin: 2.3 g/dL — ABNORMAL LOW (ref 3.5–5.0)
Anion gap: 8 (ref 5–15)
BUN: 41 mg/dL — ABNORMAL HIGH (ref 6–20)
CO2: 23 mmol/L (ref 22–32)
Calcium: 8.9 mg/dL (ref 8.9–10.3)
Chloride: 107 mmol/L (ref 98–111)
Creatinine, Ser: 1.57 mg/dL — ABNORMAL HIGH (ref 0.44–1.00)
GFR, Estimated: 38 mL/min — ABNORMAL LOW (ref >60)
Glucose, Bld: 141 mg/dL — ABNORMAL HIGH (ref 70–99)
Phosphorus: 2.7 mg/dL (ref 2.5–4.6)
Potassium: 3.6 mmol/L (ref 3.5–5.1)
Sodium: 138 mmol/L (ref 135–145)

## 2021-07-09 LAB — MAGNESIUM: Magnesium: 2 mg/dL (ref 1.7–2.4)

## 2021-07-09 MED ORDER — ACETAMINOPHEN 325 MG PO TABS
650.0000 mg | ORAL_TABLET | Freq: Four times a day (QID) | ORAL | Status: DC | PRN
  Administered 2021-07-09 – 2021-07-10 (×4): 650 mg via ORAL
  Filled 2021-07-09 (×4): qty 2

## 2021-07-09 MED ORDER — POTASSIUM CHLORIDE 10 MEQ/50ML IV SOLN
10.0000 meq | INTRAVENOUS | Status: AC
  Administered 2021-07-09 (×2): 10 meq via INTRAVENOUS
  Filled 2021-07-09 (×2): qty 50

## 2021-07-09 MED ORDER — VANCOMYCIN HCL 1250 MG/250ML IV SOLN
1250.0000 mg | Freq: Once | INTRAVENOUS | Status: AC
  Administered 2021-07-09: 1250 mg via INTRAVENOUS
  Filled 2021-07-09: qty 250

## 2021-07-09 MED ORDER — VANCOMYCIN HCL 750 MG/150ML IV SOLN
750.0000 mg | INTRAVENOUS | Status: DC
  Filled 2021-07-09: qty 150

## 2021-07-09 MED ORDER — DULOXETINE HCL 30 MG PO CPEP
60.0000 mg | ORAL_CAPSULE | Freq: Every day | ORAL | Status: DC
  Administered 2021-07-10 – 2021-07-14 (×5): 60 mg via ORAL
  Filled 2021-07-09: qty 1
  Filled 2021-07-09 (×4): qty 2

## 2021-07-09 MED ORDER — TRAVASOL 10 % IV SOLN
INTRAVENOUS | Status: AC
  Filled 2021-07-09: qty 900

## 2021-07-09 MED ORDER — METHOCARBAMOL 500 MG PO TABS
500.0000 mg | ORAL_TABLET | Freq: Three times a day (TID) | ORAL | Status: DC
  Administered 2021-07-09 – 2021-07-14 (×15): 500 mg via ORAL
  Filled 2021-07-09 (×16): qty 1

## 2021-07-09 NOTE — Progress Notes (Signed)
PHARMACY - TOTAL PARENTERAL NUTRITION CONSULT NOTE   Indication:  short gut syndrome, chronic TPN at home  Patient Measurements: Height: 5' 8"  (172.7 cm) Weight: 59.2 kg (130 lb 8.2 oz) IBW/kg (Calculated) : 63.9 TPN AdjBW (KG): 54.4 Body mass index is 19.84 kg/m.   Recent Labs    07/07/21 0401 07/08/21 0500 07/09/21 0358  NA 134* 134* 138  K 3.3* 3.5 3.6  CL 100 100 107  CO2 27 27 23   GLUCOSE 119* 123* 141*  BUN 32* 40* 41*  CREATININE 1.89* 1.86* 1.57*  CALCIUM 9.0 8.9 8.9  PHOS 4.5 5.1* 2.7  MG 1.8 2.4 2.0  ALBUMIN 2.5* 2.3* 2.3*  ALKPHOS 113 102  --   AST 49* 52*  --   ALT 49* 61*  --   BILITOT 0.6 0.4  --   TRIG  --  71  --        Assessment: 60 year old female on chronic TPN followed by [facility] for short gut syndrome.  Patient is on cyclic TPN over 12 hours at home.  Of note, previous admission in June 2022- TPN was held for a short period of time due to elevated liver enzymes.  TPN resumed on 7/27 and d/ced on 7/28 d/t bacteremia then resumed TPN on 06/28/21. She was re-admitted 10/10 with low grade temp & TPN was held on admission. 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, Crohn's s/p multiple SBR's, short gut syndrome, chronic diarrhea, anasarca, h/o diverticulosis, B12 deficiency, GERD  Glucose / Insulin: no hx DM, no insulin in home TPN All serum glucose < 150 Electrolytes: K 3.6 after 3 runs yesterday; all lytes WNL Renal: PMH CKD, SCr 1.27> 1.89>1.86>1.57 Hepatic:    AST/ALT sl elevated -  prealbumin 22.9 (7/27), 24.2 (8/3) - albumin 2.3 - TG 71 (8/13) Intake / Output; MIVF:  NS 10 ml/hr Overnight got NS at 100 ml/hr ordered x 8 hrs  I/O: = + 2533.5 ml, urine x 2, no stool, emesis x 2 GI Imaging: 8/10 CT A/P: Unchanged mild wall thickening of the descending colon, no additional areas seen, no SBO, no abscess 8/13 KUB no significant change, no obstructive changes GI Surgeries / Procedures: n/a Ordered GI Medications: Creon  36,000 units TID w/ meals,  PPI po qday,  pepcid prn  Diet: Regular Oral nutritional supplements: Prosouce plus 30 ml BID in between meals Central access: PICC line placed 04/03/21 TPN start date: on TPN PTA  Nutritional Goals (Dietician's recom on 8/11): Kcal:  1800-2000 kcal Protein:  90-105 grams Fluid:  > 1.8 L day  Goal TPN rate is 75 mL/hr (provides 90 g of protein and 1818 kcals per day)  PTA TPN Rx (see media tab for Rx image): total 1088 kCal (w/o Lipids) and 1688 kcal w/ lipids; 340 kcal AA, 748 kcal CHO -Formula w/o lipids 4 days/week- receives SMOFlipid 60g on MWF. Adds Infuvite 10 mL and Tralement 32m to each bag daily.  -Dextrose 70%= 220g/day; Plenamine 15% = 85g/day; Kphos 24 mmol/day; K acetate 57 mEq/day; Mg 10 mEq/day; CaGlu 10 mEq/day; Na Acetate 76 mEq/day - Na = 83 mEq/L  - K = 51 mEq/L - Ca= 5.5 mEq/L - Mag = 5.5 mEq/L - Phos = 13 mmol/L - Cl:Ac = 1:3  ++ Will dose inpatient TPN using dietitian's recom. for nutritional goals ++  Current Nutrition:  - Prosource plus bid - on regular diet  - TPN at 75 ml/hr  Plan:  2 runs  KCL now continue TPN at 75 ml/hr to provide 90 gm protein & 1818 kcal for 100% support - Electrolytes in TPN: Na 41mq/L, K 549m/L, Ca 2 mEq/L, Mg 49m549mL, and Phos 3 mmol/L. Cl:Ac--> 1:1 - Add standard MVI and trace elements to TPN  - no SSI  - MIVF:  NS 10 ml/hr or per MD - Monitor TPN labs on Mon/Thurs, BMET  [email or web address] 07/09/2021 8:53 AM

## 2021-07-09 NOTE — Progress Notes (Signed)
Brief ID note:  Patient chart reviewed.  Unfortunately, she developed fever starting last night as high as 103.3.  Not associated with hypotension.  Her WBC remains stable at 2.4.  Blood cultures from 8/10 remain no growth and she is on meropenem.  Vancomycin was stopped on Friday.  We will repeat blood cultures again today given her high risk for bacteremia in the setting of retained femoral PICC line.  Continue meropenem for now and Dr. Gale Journey will return tomorrow.    [name] for Infectious Disease [facility] 07/09/2021, 7:43 AM

## 2021-07-09 NOTE — Plan of Care (Signed)

## 2021-07-09 NOTE — Progress Notes (Signed)
PROGRESS NOTE  Caitlin Peters KDX:[id number] DOB: 17-Mar-1961   PCP: [name], [name], MD  Patient is from: Home.  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  ID consulted.  Started on IV meropenem and vancomycin.  The latter was discontinued per ID recommendation.  ID recommended IV Cipro through 8/17, if blood cultures negative through the weekend.  Blood cultures on 8/10 NGTD.  Patient spiked fever to 103 the evening of 8/13.  She is still on IV meropenem.  Repeat blood cultures obtained.  Added IV vancomycin on 8/14.  Also discontinued Reglan, methadone and Wellbutrin, and reduce Cymbalta to 60 mg daily out of concern for serotonin syndrome.  Subjective: Seen and examined earlier this morning.  She reports pain all over.  She rates her pain 9/10.  Denies shortness of breath, nausea or vomiting.  She denies diarrhea.  She denies melena or hematochezia.  Denies UTI symptoms.  Objective: Vitals:   07/09/21 0628 07/09/21 0858 07/09/21 1019 07/09/21 1409  BP: (!) 147/70   (!) 178/68  Pulse: 75   88  Resp: 15   20  Temp: 98.4 F (36.9 C) (!) 101.7 F (38.7 C) (!) 100.4 F (38 C) (!) 102.8 F (39.3 C)  TempSrc: Oral Oral Axillary Oral  SpO2: 100%   96%  Weight:      Height:        Intake/Output Summary (Last 24 hours) at 07/09/2021 1450 Last data filed at 07/09/2021 0600 Gross per 24 hour  Intake 2293.49 ml  Output --  Net 2293.49 ml   Filed Weights   07/07/21 0500 07/08/21 0509 07/09/21 0607  Weight: 59.3 kg  59.2 kg 59.2 kg    Examination:  GENERAL: Frail and chronically ill-appearing.  Nontoxic. HEENT: MMM.  Vision and hearing grossly intact.  NECK: Supple.  No apparent JVD.  RESP: On RA.  No IWOB.  Fair aeration bilaterally. CVS:  RRR. Heart sounds normal.  ABD/GI/GU: BS+. Abd soft, NTND.  MSK/EXT:  Moves extremities.  Significant muscle mass and subcu fat loss.  TTP out of proportion in her back bilaterally. SKIN: no apparent skin lesion or wound NEURO: Awake and alert. Oriented appropriately.  No apparent focal neuro deficit. PSYCH: Calm. Normal affect.   Procedures:  None  Microbiology summarized: [id number] and influenza PCR nonreactive. Blood cultures NGTD.  Assessment & Plan: Febrile illness: Initial fever was 100.4 on 8/10.  Rt femoral line last exchangedin 03/2021.  Remained afebrile since then until 8/13 and spiked fever to 103.  This was after she started Reglan for nausea and vomiting.  She is on multiple medications with potential for serotonin syndrome. There was also concern about CVL infection but blood culture from 8/10 NGTD.  She he has had Acinetobacter bacteremia for which she was a started on Cipro last month. Repeat blood culture on 8/13 pending. -Appreciate recommendation by ID  -Continue IV meropenem -Added IV vancomycin on 8/14 until repeat blood cultures are negative -Discontinued Reglan, Wellbutrin and methadone, and decreased Cymbalta to 60 mg daily -Monitor fever  curve and CBC    Short gut syndrome - complex hx of Crohn's  TPN dependent at home - resume TPN per pharmacy -Home TPN per pharmacy  AKI on CKD-3A/azotemia: Improving. Recent Labs    06/26/21 0410 06/27/21 0340 06/28/21 0422 06/29/21 0450 06/30/21 0350 07/05/21 0924 07/06/21 0646 07/07/21 0401 07/08/21 0500 07/09/21 0358  BUN 12 11 13 20  26* 36* 22* 32* 40* 41*  CREATININE 1.26* 1.21* 1.29* 1.68* 1.56* 1.42* 1.27* 1.89* 1.86* 1.57*  -Recheck in the morning -Avoid nephrotoxic  meds  Pancytopenia: H&H seems to be at baseline.  Thrombocytopenia worse.  Leukopenia improved today Recent Labs    06/24/21 0402 06/25/21 0325 06/26/21 0410 06/27/21 0340 06/28/21 0422 06/29/21 0450 07/05/21 0924 07/06/21 0646 07/07/21 0401 07/09/21 0358  HGB 7.8* 7.6* 8.5* 8.0* 8.3* 8.3* 8.3* 8.9* 8.8* 7.8*   Recent Labs  Lab 07/05/21 0924 07/06/21 0646 07/07/21 0401 07/09/21 0358  PLT 137* 124* 120* 81*  -Continue monitoring   Mild transaminitis: Due to TPN?  Relatively stable.  Bili and ALP within normal.  -Monitor   Hypokalemia: Pharmacy replacing with TPN.  Hyponatremia: Likely hypovolemic. -Continue monitoring  Seizure disorder: Stable. -Cont usual keppra dose    Essential HTN: Normotensive. -Continue amlodipine   Chronic pain syndrome: On p.o. Dilaudid, methadone and Robaxin at home -Currently on IV Dilaudid and p.o. Robaxin. -Transition to home p.o. Dilaudid in the morning  Nausea without emesis -IV Zofran as needed.   Mood disorder: -Decrease Cymbalta to 60 mg daily -Discontinued Wellbutrin  Moderate malnutrition Body mass index is 19.84 kg/m. Nutrition Problem: Moderate Malnutrition Etiology: chronic illness Signs/Symptoms: mild fat depletion, mild muscle depletion Interventions: Prostat, TPN   DVT prophylaxis:  Place and maintain sequential compression device Start: 07/09/21 0751 Place TED hose Start: 07/05/21 2202  Code Status: Full code Family Communication: Patient and/or RN. Available if any question.  Level of care: Med-Surg Status is: Inpatient  Remains inpatient appropriate because:IV treatments appropriate due to intensity of illness or inability to take PO and Inpatient level of care appropriate due to severity of illness  Dispo: The patient is from: Home              Anticipated d/c is to: Home              Patient currently is not medically stable to d/c.   Difficult to place patient No       Consultants:   Infectious disease   Sch Meds:  Scheduled Meds:  (feeding supplement) PROSource Plus  30 mL Oral BID BM   amLODipine  10 mg Oral Daily   budesonide  3 mg Oral Q3 days   calcium carbonate  1 tablet Oral Q breakfast   carvedilol  25 mg Oral BID WC   Chlorhexidine Gluconate Cloth  6 each Topical Daily   cholecalciferol  1,000 Units Oral Daily   cycloSPORINE  1 drop Both Eyes BID   [START ON 07/10/2021] DULoxetine  60 mg Oral Daily   hydrALAZINE  100 mg Oral TID   levETIRAcetam  500 mg Oral BID   lipase/protease/amylase  36,000 Units Oral TID AC   methocarbamol  500 mg Oral TID   pantoprazole  40 mg Oral Daily   vitamin B-12  1,000 mcg Oral Daily   Continuous Infusions:  sodium chloride 10 mL/hr at 07/07/21 2008   meropenem (MERREM) IV 1 g (07/09/21 0611)   TPN ADULT (ION) 75 mL/hr at 07/08/21 1800   TPN ADULT (ION)  PRN Meds:.acetaminophen, diphenhydrAMINE, famotidine, HYDROmorphone (DILAUDID) injection, naphazoline-glycerin, ondansetron (ZOFRAN) IV, sodium chloride flush  Antimicrobials: Anti-infectives (From admission, onward)    Start     Dose/Rate Route Frequency Ordered Stop   07/06/21 1800  vancomycin (VANCOREADY) IVPB 750 mg/150 mL  Status:  Discontinued        750 mg 150 mL/hr over 60 Minutes Intravenous Every 24 hours 07/05/21 1631 07/06/21 0842   07/06/21 1400  vancomycin (VANCOCIN) IVPB 1000 mg/200 mL premix  Status:  Discontinued        1,000 mg 200 mL/hr over 60 Minutes Intravenous Every 24 hours 07/06/21 0842 07/07/21 1310   07/05/21 1700  meropenem (MERREM) 1 g in sodium chloride 0.9 % 100 mL IVPB        1 g 200 mL/hr over 30 Minutes Intravenous Every 12 hours 07/05/21 1608     07/05/21 1700  vancomycin (VANCOREADY) IVPB 1250 mg/250 mL        1,250 mg 166.7 mL/hr over 90 Minutes Intravenous  Once 07/05/21 1630 07/05/21 1942        I have personally reviewed the following labs and images: CBC: Recent Labs  Lab 07/05/21 0924 07/06/21 0646  07/07/21 0401 07/09/21 0358  WBC 2.8* 2.3* 1.6* 2.4*  NEUTROABS 2.1  --   --   --   HGB 8.3* 8.9* 8.8* 7.8*  HCT 25.1* 26.9* 27.0* 24.3*  MCV 91.9 91.2 92.2 93.1  PLT 137* 124* 120* 81*   BMP &GFR Recent Labs  Lab 07/05/21 0924 07/06/21 0646 07/07/21 0401 07/08/21 0500 07/09/21 0358  NA 138 137 134* 134* 138  K 3.9 3.6 3.3* 3.5 3.6  CL 100 99 100 100 107  CO2 27 27 27 27 23   GLUCOSE 107* 97 119* 123* 141*  BUN 36* 22* 32* 40* 41*  CREATININE 1.42* 1.27* 1.89* 1.86* 1.57*  CALCIUM 9.3 9.3 9.0 8.9 8.9  MG  --   --  1.8 2.4 2.0  PHOS  --   --  4.5 5.1* 2.7   Estimated Creatinine Clearance: 35.6 mL/min (A) (by C-G formula based on SCr of 1.57 mg/dL (H)). Liver & Pancreas: Recent Labs  Lab 07/05/21 0924 07/06/21 0646 07/07/21 0401 07/08/21 0500 07/09/21 0358  AST 23 29 49* 52*  --   ALT 23 32 49* 61*  --   ALKPHOS 92 96 113 102  --   BILITOT 0.5 0.7 0.6 0.4  --   PROT 6.2* 6.0* 5.6* 5.3*  --   ALBUMIN 2.9* 2.7* 2.5* 2.3* 2.3*   Recent Labs  Lab 07/05/21 0924  LIPASE 38   No results for input(s): AMMONIA in the last 168 hours. Diabetic: No results for input(s): HGBA1C in the last 72 hours. No results for input(s): GLUCAP in the last 168 hours. Cardiac Enzymes: Recent Labs  Lab 07/08/21 1820  CKTOTAL 16*   No results for input(s): PROBNP in the last 8760 hours. Coagulation Profile: No results for input(s): INR, PROTIME in the last 168 hours. Thyroid Function Tests: No results for input(s): TSH, T4TOTAL, FREET4, T3FREE, THYROIDAB in the last 72 hours.  Lipid Profile: Recent Labs    07/08/21 0500  TRIG 71   Anemia Panel: No results for input(s): VITAMINB12, FOLATE, FERRITIN, TIBC, IRON, RETICCTPCT in the last 72 hours. Urine analysis:    Component Value Date/Time   COLORURINE YELLOW 06/19/2021 2145   APPEARANCEUR CLEAR 06/19/2021 2145   LABSPEC 1.015 06/19/2021 2145   PHURINE 6.0 06/19/2021 2145   GLUCOSEU NEGATIVE 06/19/2021  2145   HGBUR  NEGATIVE 06/19/2021 2145   BILIRUBINUR NEGATIVE 06/19/2021 2145   [name] NEGATIVE 06/19/2021 2145   PROTEINUR 100 (A) 06/19/2021 2145   NITRITE NEGATIVE 06/19/2021 2145   LEUKOCYTESUR NEGATIVE 06/19/2021 2145   Sepsis Labs: Invalid input(s): PROCALCITONIN, [place]  Microbiology: Recent Results (from the past 240 hour(s))  Blood culture (routine x 2)     Status: None (Preliminary result)   Collection Time: 07/05/21  8:45 AM   Specimen: BLOOD  Result Value Ref Range Status   Specimen Description   Final    BLOOD BLOOD RIGHT FOREARM Performed at [facility] [address], [place], [place] [address]    Special Requests   Final    BOTTLES DRAWN AEROBIC AND ANAEROBIC Blood Culture results may not be optimal due to an inadequate volume of blood received in culture bottles Performed at [facility] [address], [place], [place] [address]    Culture   Final    NO GROWTH 4 DAYS Performed at [facility], [place] [address] [place], [place] [address]    Report Status PENDING  Incomplete  Blood culture (routine x 2)     Status: None (Preliminary result)   Collection Time: 07/05/21  9:10 AM   Specimen: BLOOD LEFT HAND  Result Value Ref Range Status   Specimen Description   Final    BLOOD LEFT HAND Performed at [facility] [address] [place], [place] [address]    Special Requests   Final    BOTTLES DRAWN AEROBIC AND ANAEROBIC Blood Culture adequate volume Performed at [facility] [address] [place], [place] [address]    Culture   Final    NO GROWTH 4 DAYS Performed at [facility], [place] [address], [place], [place] [address]    Report Status PENDING  Incomplete  Resp Panel by RT-PCR (Flu A&B, Covid) Nasopharyngeal Swab     Status: None   Collection Time: 07/05/21  9:24 AM   Specimen: Nasopharyngeal Swab; Nasopharyngeal(NP) swabs in vial transport medium  Result Value Ref  Range Status   SARS Coronavirus 2 by RT PCR NEGATIVE NEGATIVE Final    Comment: (NOTE) SARS-CoV-2 target nucleic acids are NOT DETECTED.  The SARS-CoV-2 RNA is generally detectable in upper respiratory specimens during the acute phase of infection. The lowest concentration of SARS-CoV-2 viral copies this assay can detect is 138 copies/mL. A negative result does not preclude SARS-Cov-2 infection and should not be used as the sole basis for treatment or other patient management decisions. A negative result may occur with  improper specimen collection/handling, submission of specimen other than nasopharyngeal swab, presence of viral mutation(s) within the areas targeted by this assay, and inadequate number of viral copies(<138 copies/mL). A negative result must be combined with clinical observations, patient history, and epidemiological information. The expected result is Negative.  Fact Sheet for Patients:  EntrepreneurPulse.com.au  Fact Sheet for Healthcare Providers:  IncredibleEmployment.be  This test is no t yet approved or cleared by the [place] FDA and  has been authorized for detection and/or diagnosis of SARS-CoV-2 by FDA under an Emergency Use Authorization (EUA). This EUA will remain  in effect (meaning this test can be used) for the duration of the COVID-19 declaration under Section 564(b)(1) of the Act, 21 U.S.C.section 360bbb-3(b)(1), unless the authorization is terminated  or revoked sooner.       Influenza A by PCR NEGATIVE NEGATIVE Final  Influenza B by PCR NEGATIVE NEGATIVE Final    Comment: (NOTE) The Xpert Xpress SARS-CoV-2/FLU/RSV plus assay is intended as an aid in the diagnosis of influenza from Nasopharyngeal swab specimens and should not be used as a sole basis for treatment. Nasal washings and aspirates are unacceptable for Xpert Xpress SARS-CoV-2/FLU/RSV testing.  Fact Sheet for  Patients: EntrepreneurPulse.com.au  Fact Sheet for Healthcare Providers: IncredibleEmployment.be  This test is not yet approved or cleared by the [place] FDA and has been authorized for detection and/or diagnosis of SARS-CoV-2 by FDA under an Emergency Use Authorization (EUA). This EUA will remain in effect (meaning this test can be used) for the duration of the COVID-19 declaration under Section 564(b)(1) of the Act, 21 U.S.C. section 360bbb-3(b)(1), unless the authorization is terminated or revoked.  Performed at [facility], [place] [address] [place], [place] [address]     Radiology Studies: DG Abd Portable 1V  Result Date: 07/08/2021 CLINICAL DATA:  Abdominal pain EXAM: PORTABLE ABDOMEN - 1 VIEW COMPARISON:  07/05/2021 CT FINDINGS: Scattered large and small bowel gas is noted. Right femoral line is noted extending into intrahepatic IVC. Scattered large and small bowel gas is noted. No obstructive changes are seen. Overall appearance is similar to that seen on the previous exam. No bony abnormality is noted. IMPRESSION: No acute abnormality noted. No significant change from the prior exam. Electronically Signed   By: Inez Catalina M.D.   On: 07/08/2021 19:08      Jahnavi Muratore T. Packwood  If 7PM-7AM, please contact night-coverage [email or web address] 07/09/2021, 2:50 PM

## 2021-07-09 NOTE — Progress Notes (Addendum)
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 Broad spectrum antibiotics initiated on admission.   Today, 07/09/2021: D5 meropenem Vancomycin to be resumed today d/t fever spike WBC remain low- 2.4 SCr 1.57, CrCl ~ 36 ml/min Tmax 103.3  Plan: Continue meropenem 1g q12 Vancomycin 1250 mg IV loading dose  Vancomycin 750 mg IV q24 for est AUC 518 using TBW & SCr 1.57 F/u renal function, WBC, temp, culture data F/u ID recs Vancomycin levels as needed   Height: 5' 8"  (172.7 cm) Weight: 59.2 kg (130 lb 8.2 oz) IBW/kg (Calculated) : 63.9  Temp (24hrs), Avg:101.4 F (38.6 C), Min:98.4 F (36.9 C), Max:103.3 F (39.6 C)  Recent Labs  Lab 07/05/21 0924 07/05/21 2050 07/06/21 0646 07/07/21 0401 07/08/21 0500 07/08/21 1820 07/08/21 2041 07/09/21 0358  WBC 2.8*  --  2.3* 1.6*  --   --   --  2.4*  CREATININE 1.42*  --  1.27* 1.89* 1.86*  --   --  1.57*  LATICACIDVEN 0.8 0.8  --   --   --  1.0 0.9  --      Estimated Creatinine Clearance: 35.6 mL/min (A) (by C-G formula based on SCr of 1.57 mg/dL (H)).    Allergies  Allergen Reactions   Meperidine Hives    Other reaction(s): GI Upset Due to [place]    Hyoscyamine Hives and Swelling    Legs swelling Disorientation   Cefepime Other (See Comments)    Neurotoxicity occurring in setting of AKI. Ceftriaxone tolerated during same admit   Gabapentin Other (See Comments)    unknown   Lyrica [Pregabalin] Other (See Comments)    unknown   Topamax [Topiramate] Other (See Comments)    unknown   Zosyn [Piperacillin Sod-Tazobactam So] Other (See Comments)    Patient reports it makes her vomit, her neck stiff, and her "heart feel funny"   Fentanyl Rash    Pt is allergic to fentanyl patch related to the glue (gives her a rash) Pt states she is NOT allergic to fentanyl IV  medicine   Morphine And Related Rash  Antimicrobials this admission: meropenem 8/10 >>  vancomycin 8/10 >> 8/12, resumed 8/14>> Dose adjustments this admission: 8/11: incr vanc 750 >> 1000 q24 w/ improved SCr Microbiology results: 8/10 BCx2: ngtd 8/14 BCx2: sent 7/25 BCx: Acinetobacter ursingii (R SMX/TMP) 7/31 rpt BCx: ngF  Thank you for allowing pharmacy to be a part of this patient's care.  Eudelia Bunch, Pharm.D 07/09/2021 3:42 PM

## 2021-07-10 ENCOUNTER — Inpatient Hospital Stay (HOSPITAL_COMMUNITY): Payer: Medicare Other

## 2021-07-10 DIAGNOSIS — R509 Fever, unspecified: Secondary | ICD-10-CM | POA: Diagnosis not present

## 2021-07-10 DIAGNOSIS — R748 Abnormal levels of other serum enzymes: Secondary | ICD-10-CM

## 2021-07-10 DIAGNOSIS — T80211A Bloodstream infection due to central venous catheter, initial encounter: Secondary | ICD-10-CM | POA: Diagnosis not present

## 2021-07-10 DIAGNOSIS — E440 Moderate protein-calorie malnutrition: Secondary | ICD-10-CM | POA: Diagnosis not present

## 2021-07-10 DIAGNOSIS — T827XXD Infection and inflammatory reaction due to other cardiac and vascular devices, implants and grafts, subsequent encounter: Secondary | ICD-10-CM | POA: Diagnosis not present

## 2021-07-10 DIAGNOSIS — T80219D Unspecified infection due to central venous catheter, subsequent encounter: Secondary | ICD-10-CM | POA: Diagnosis not present

## 2021-07-10 DIAGNOSIS — A419 Sepsis, unspecified organism: Secondary | ICD-10-CM | POA: Diagnosis not present

## 2021-07-10 DIAGNOSIS — D696 Thrombocytopenia, unspecified: Secondary | ICD-10-CM

## 2021-07-10 DIAGNOSIS — Z789 Other specified health status: Secondary | ICD-10-CM | POA: Diagnosis not present

## 2021-07-10 LAB — COMPREHENSIVE METABOLIC PANEL
ALT: 281 U/L — ABNORMAL HIGH (ref 0–44)
AST: 281 U/L — ABNORMAL HIGH (ref 15–41)
Albumin: 2.2 g/dL — ABNORMAL LOW (ref 3.5–5.0)
Alkaline Phosphatase: 211 U/L — ABNORMAL HIGH (ref 38–126)
Anion gap: 4 — ABNORMAL LOW (ref 5–15)
BUN: 40 mg/dL — ABNORMAL HIGH (ref 6–20)
CO2: 21 mmol/L — ABNORMAL LOW (ref 22–32)
Calcium: 8.9 mg/dL (ref 8.9–10.3)
Chloride: 109 mmol/L (ref 98–111)
Creatinine, Ser: 1.65 mg/dL — ABNORMAL HIGH (ref 0.44–1.00)
GFR, Estimated: 35 mL/min — ABNORMAL LOW (ref >60)
Glucose, Bld: 148 mg/dL — ABNORMAL HIGH (ref 70–99)
Potassium: 4.1 mmol/L (ref 3.5–5.1)
Sodium: 134 mmol/L — ABNORMAL LOW (ref 135–145)
Total Bilirubin: 1.4 mg/dL — ABNORMAL HIGH (ref 0.3–1.2)
Total Protein: 5.2 g/dL — ABNORMAL LOW (ref 6.5–8.1)

## 2021-07-10 LAB — CBC WITH DIFFERENTIAL/PLATELET
Abs Immature Granulocytes: 0.04 10*3/uL (ref 0.00–0.07)
Basophils Absolute: 0 10*3/uL (ref 0.0–0.1)
Basophils Relative: 0 %
Eosinophils Absolute: 0 10*3/uL (ref 0.0–0.5)
Eosinophils Relative: 0 %
HCT: 23 % — ABNORMAL LOW (ref 36.0–46.0)
Hemoglobin: 7.5 g/dL — ABNORMAL LOW (ref 12.0–15.0)
Immature Granulocytes: 1 %
Lymphocytes Relative: 10 %
Lymphs Abs: 0.3 10*3/uL — ABNORMAL LOW (ref 0.7–4.0)
MCH: 30.4 pg (ref 26.0–34.0)
MCHC: 32.6 g/dL (ref 30.0–36.0)
MCV: 93.1 fL (ref 80.0–100.0)
Monocytes Absolute: 0.2 10*3/uL (ref 0.1–1.0)
Monocytes Relative: 6 %
Neutro Abs: 2.3 10*3/uL (ref 1.7–7.7)
Neutrophils Relative %: 83 %
Platelets: 65 10*3/uL — ABNORMAL LOW (ref 150–400)
RBC: 2.47 MIL/uL — ABNORMAL LOW (ref 3.87–5.11)
RDW: 13.9 % (ref 11.5–15.5)
WBC: 2.9 10*3/uL — ABNORMAL LOW (ref 4.0–10.5)
nRBC: 0 % (ref 0.0–0.2)

## 2021-07-10 LAB — BLOOD CULTURE ID PANEL (REFLEXED) - BCID2
A.calcoaceticus-baumannii: NOT DETECTED
Bacteroides fragilis: NOT DETECTED
Candida albicans: NOT DETECTED
Candida auris: NOT DETECTED
Candida glabrata: NOT DETECTED
Candida krusei: NOT DETECTED
Candida parapsilosis: DETECTED — AB
Candida tropicalis: NOT DETECTED
Cryptococcus neoformans/gattii: NOT DETECTED
Enterobacter cloacae complex: NOT DETECTED
Enterobacterales: NOT DETECTED
Enterococcus Faecium: NOT DETECTED
Enterococcus faecalis: NOT DETECTED
Escherichia coli: NOT DETECTED
Haemophilus influenzae: NOT DETECTED
Klebsiella aerogenes: NOT DETECTED
Klebsiella oxytoca: NOT DETECTED
Klebsiella pneumoniae: NOT DETECTED
Listeria monocytogenes: NOT DETECTED
Neisseria meningitidis: NOT DETECTED
Proteus species: NOT DETECTED
Pseudomonas aeruginosa: NOT DETECTED
Salmonella species: NOT DETECTED
Serratia marcescens: NOT DETECTED
Staphylococcus aureus (BCID): NOT DETECTED
Staphylococcus epidermidis: NOT DETECTED
Staphylococcus lugdunensis: NOT DETECTED
Staphylococcus species: NOT DETECTED
Stenotrophomonas maltophilia: NOT DETECTED
Streptococcus agalactiae: NOT DETECTED
Streptococcus pneumoniae: NOT DETECTED
Streptococcus pyogenes: NOT DETECTED
Streptococcus species: NOT DETECTED

## 2021-07-10 LAB — CULTURE, BLOOD (ROUTINE X 2)
Culture: NO GROWTH
Culture: NO GROWTH
Special Requests: ADEQUATE

## 2021-07-10 LAB — DIC (DISSEMINATED INTRAVASCULAR COAGULATION)PANEL
D-Dimer, Quant: 6.3 ug/mL-FEU — ABNORMAL HIGH (ref 0.00–0.50)
Fibrinogen: 424 mg/dL (ref 210–475)
INR: 1.1 (ref 0.8–1.2)
Platelets: 51 10*3/uL — ABNORMAL LOW (ref 150–400)
Prothrombin Time: 14.2 seconds (ref 11.4–15.2)
aPTT: 36 seconds (ref 24–36)

## 2021-07-10 LAB — HEPATITIS PANEL, ACUTE
HCV Ab: NONREACTIVE
Hep A IgM: NONREACTIVE
Hep B C IgM: NONREACTIVE
Hepatitis B Surface Ag: NONREACTIVE

## 2021-07-10 LAB — MAGNESIUM: Magnesium: 1.8 mg/dL (ref 1.7–2.4)

## 2021-07-10 LAB — TRIGLYCERIDES: Triglycerides: 88 mg/dL (ref <150)

## 2021-07-10 LAB — LACTIC ACID, PLASMA: Lactic Acid, Venous: 1 mmol/L (ref 0.5–1.9)

## 2021-07-10 LAB — PHOSPHORUS: Phosphorus: 1.8 mg/dL — ABNORMAL LOW (ref 2.5–4.6)

## 2021-07-10 MED ORDER — SODIUM CHLORIDE 0.9 % IV SOLN
200.0000 mg | Freq: Once | INTRAVENOUS | Status: AC
  Administered 2021-07-10: 200 mg via INTRAVENOUS
  Filled 2021-07-10: qty 200

## 2021-07-10 MED ORDER — DEXTROSE-NACL 5-0.9 % IV SOLN: INTRAVENOUS | Status: DC

## 2021-07-10 MED ORDER — SODIUM PHOSPHATES 45 MMOLE/15ML IV SOLN
30.0000 mmol | Freq: Once | INTRAVENOUS | Status: AC
  Administered 2021-07-10: 30 mmol via INTRAVENOUS
  Filled 2021-07-10: qty 10

## 2021-07-10 MED ORDER — TRAVASOL 10 % IV SOLN
INTRAVENOUS | Status: DC
  Filled 2021-07-10: qty 900

## 2021-07-10 MED ORDER — CIPROFLOXACIN IN D5W 400 MG/200ML IV SOLN
400.0000 mg | Freq: Two times a day (BID) | INTRAVENOUS | Status: DC
  Administered 2021-07-10 – 2021-07-14 (×9): 400 mg via INTRAVENOUS
  Filled 2021-07-10 (×9): qty 200

## 2021-07-10 MED ORDER — IBUPROFEN 200 MG PO TABS
200.0000 mg | ORAL_TABLET | Freq: Once | ORAL | Status: DC
  Filled 2021-07-10: qty 1

## 2021-07-10 MED ORDER — HYDRALAZINE HCL 20 MG/ML IJ SOLN
10.0000 mg | Freq: Four times a day (QID) | INTRAMUSCULAR | Status: DC | PRN
Start: 2021-07-10 — End: 2021-07-15
  Administered 2021-07-10: 10 mg via INTRAVENOUS
  Filled 2021-07-10: qty 1

## 2021-07-10 MED ORDER — CLONIDINE HCL 0.1 MG PO TABS
0.2000 mg | ORAL_TABLET | Freq: Three times a day (TID) | ORAL | Status: DC
  Administered 2021-07-10 – 2021-07-11 (×2): 0.2 mg via ORAL
  Filled 2021-07-10 (×2): qty 1
  Filled 2021-07-10: qty 2

## 2021-07-10 MED ORDER — MAGNESIUM SULFATE 2 GM/50ML IV SOLN
2.0000 g | Freq: Once | INTRAVENOUS | Status: AC
  Administered 2021-07-10: 2 g via INTRAVENOUS
  Filled 2021-07-10: qty 50

## 2021-07-10 MED ORDER — SODIUM CHLORIDE 0.9 % IV SOLN
100.0000 mg | INTRAVENOUS | Status: DC
  Administered 2021-07-11 – 2021-07-12 (×2): 100 mg via INTRAVENOUS
  Filled 2021-07-10 (×3): qty 100

## 2021-07-10 NOTE — Progress Notes (Signed)
   07/10/21 1210  Vitals  Temp (!) 103 F (39.4 C)  Temp Source Oral  BP (!) 183/67  MAP (mmHg) 98  BP Method Automatic  Pulse Rate 91  Resp 20  Level of Consciousness  Level of Consciousness Alert  MEWS COLOR  MEWS Score Color Yellow   MD updated.

## 2021-07-10 NOTE — Progress Notes (Signed)
   07/10/21 1433  Vitals  Temp (!) 100.5 F (38.1 C)  Temp Source Oral  BP (!) 152/59  MAP (mmHg) 84  BP Location Right Arm  BP Method Automatic  Patient Position (if appropriate) Lying  Pulse Rate 88  Pulse Rate Source Monitor  Resp 18

## 2021-07-10 NOTE — Progress Notes (Signed)
Patient ran two fevers during night shift, 101.6 and 101.3. Gave PRN tylenol, which was effective even when nurse continued to monitor patient's temperature.

## 2021-07-10 NOTE — Care Management Important Message (Signed)
Important Message  Patient Details IM Letter given to the Patient. Name: Caitlin Peters MRN: [id number] Date of Birth: 1961/10/23   Medicare Important Message Given:  Yes     [name] 07/10/2021, 2:41 PM

## 2021-07-10 NOTE — Progress Notes (Addendum)
PHARMACY - TOTAL PARENTERAL NUTRITION CONSULT NOTE   Indication:  short gut syndrome, chronic TPN at home  Patient Measurements: Height: 5' 8"  (172.7 cm) Weight: 60.6 kg (133 lb 9.6 oz) IBW/kg (Calculated) : 63.9 TPN AdjBW (KG): 54.4 Body mass index is 20.31 kg/m.   Recent Labs    07/08/21 0500 07/09/21 0358 07/10/21 0337  NA 134* 138 134*  K 3.5 3.6 4.1  CL 100 107 109  CO2 27 23 21*  GLUCOSE 123* 141* 148*  BUN 40* 41* 40*  CREATININE 1.86* 1.57* 1.65*  CALCIUM 8.9 8.9 8.9  PHOS 5.1* 2.7 1.8*  MG 2.4 2.0 1.8  ALBUMIN 2.3* 2.3* 2.2*  ALKPHOS 102  --  211*  AST 52*  --  281*  ALT 61*  --  281*  BILITOT 0.4  --  1.4*  TRIG 71  --  88       Assessment: 60 year old female on chronic TPN followed by [facility] for short gut syndrome.  Patient is on cyclic TPN over 12 hours at home.  Of note, previous admission in June 2022- TPN was held for a short period of time due to elevated liver enzymes.  TPN resumed on 7/27 and d/ced on 7/28 d/t bacteremia then resumed TPN on 06/28/21. She was re-admitted 10/10 with low grade temp & TPN was held on admission. 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, Crohn's s/p multiple SBR's, short gut syndrome, chronic diarrhea, anasarca, h/o diverticulosis, B12 deficiency, GERD  Glucose / Insulin: no hx DM, no insulin in home TPN All serum glucose < 150 Electrolytes: K 4.1 after 2 runs yesterday and incr in TPN; Na 134, Phos 1.8, Mg 1.8, Cl 21, CoCa 10.3 (holding Ca suppl and minimize in TPN) Renal: PMH CKD, SCr 1.27> 1.89>1.86>1.65 Hepatic: AST 52 > 281, ALT 61 >281; Tbili 0.4 > 1.4 -  prealbumin 22.9 (7/27), 24.2 (8/3) - albumin 2.2 - TG 88 (8/15) Intake / Output; MIVF:  NS 10 ml/hr  I/O: = + 3578m, urine x 5, stool x 4, no emesis GI Imaging: 8/10 CT A/P: Unchanged mild wall thickening of the descending colon, no additional areas seen, no SBO, no abscess 8/13 KUB no significant change, no obstructive changes GI  Surgeries / Procedures: n/a Ordered GI Medications: Creon 36,000 units TID w/ meals,  PPI po qday, pepcid prn  Diet: Regular Oral nutritional supplements: Prosouce plus 30 ml BID in between meals Central access: PICC line placed 04/03/21 TPN start date: on TPN PTA  Nutritional Goals (Dietician's recom on 8/11): Kcal:  1800-2000 kcal Protein:  90-105 grams Fluid:  > 1.8 L day  Goal TPN rate is 75 mL/hr (provides 90 g of protein and 1818 kcals per day)  PTA TPN Rx (see media tab for Rx image): total 1088 kCal (w/o Lipids) and 1688 kcal w/ lipids; 340 kcal AA, 748 kcal CHO -Formula w/o lipids 4 days/week- receives SMOFlipid 60g on MWF. Adds Infuvite 10 mL and Tralement 181mto each bag daily.  -Dextrose 70%= 220g/day; Plenamine 15% = 85g/day; Kphos 24 mmol/day; K acetate 57 mEq/day; Mg 10 mEq/day; CaGlu 10 mEq/day; Na Acetate 76 mEq/day - Na = 83 mEq/L  - K = 51 mEq/L - Ca= 5.5 mEq/L - Mag = 5.5 mEq/L - Phos = 13 mmol/L - Cl:Ac = 1:3  ++ Will dose inpatient TPN using dietitian's recom. for nutritional goals ++  Current Nutrition:  - Prosource plus bid - on regular diet  -  TPN at 75 ml/hr  Plan:  -Give sodium phosphate 30 mmol IV x1 now -Give magnesium 2g IV x1 now Continue TPN at 75 ml/hr to provide 90 gm protein & 1818 kcal for 100% support - Electrolytes in TPN: Na 51mq/L, K 571m/L, Ca 2 mEq/L, Mg 56m75mL, and Phos 7 mmol/L. Cl:Ac--> 2:1 - Add standard MVI and trace elements to TPN  - No SSI  - MIVF:  NS 10 ml/hr or per MD - Repeat CMET, Mg, and Phos on 8/16 - Continue to trend LFTs - consider transitioning to cycle TPN if continues to increase (waiting for electrolytes to normalize) - Monitor TPN labs on Mon/Thurs  MarDimple NanasharmD 07/10/2021 7:29 AM

## 2021-07-10 NOTE — Sepsis Progress Note (Signed)
Rapid response called due to change in patient status from RNs previous encounter with patient. Patient awake but encephalopathic repeating words. Unable to answer LOC questions. Follows commands. Hypertensive (baseline and has Hydralazine ordered,) shivering due to 103 fever. Pulse 94, RR slightly tachypnic due to shivering. Currently has no antipyretics due to elevated liver enzymes. Staff reporting this behavior is not her baseline. She has been febrile throughout admission. Blood cultures drawn and positive for yeast, patient on antibiotics and antifungals. Plan for removal of tunneled PICC in AM. Patient complains of back chronic pain and repeats I want to go home. Maintaining oxygen on room air. Last lactic acid 0.9 on 8/13, repeating per Sepsis Protocol. UA ordered for protocol as well, patient incontinent at this time. Fluids running at 100 ml / hr, patient does not meet criteria for fluid bolus. MD paged to notify of change in status. Ice, LA, and other orders received. Will continue to follow.

## 2021-07-10 NOTE — Progress Notes (Addendum)
    OVERNIGHT PROGRESS REPORT Notified by RN and RR RN for continued temperature increase in spite of mitigation efforts including ice packs and environmental control.  Followed by ID due to possible infection of tunneled catheter scheduled to be removed on 07/11/21. Antibiotics have been adjusted today by attending and ID recommendation.  Patient is now transferred to SDU and cooling blanket is in place due to inability to receive usual medications in reference to her elevated LFTs.  She is awake and responsive Vital signs remain stable.  Caitlin Peters MSNA MSN [id number]-AG Acute Care Nurse Practitioner [place]

## 2021-07-10 NOTE — Progress Notes (Signed)
   07/10/21 0915  Vitals  Temp (!) 102.8 F (39.3 C)  Temp Source Oral  BP (!) 197/77  MAP (mmHg) 111  BP Method Automatic  Pulse Rate 95  Resp (!) 21

## 2021-07-10 NOTE — Plan of Care (Signed)
  Problem: Elimination: Goal: Will not experience complications related to bowel motility Outcome: Progressing Goal: Will not experience complications related to urinary retention Outcome: Progressing   Problem: Safety: Goal: Ability to remain free from injury will improve Outcome: Progressing

## 2021-07-10 NOTE — Progress Notes (Signed)
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  Right femoral line last exchanged May 2022. 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 Fever ongoing Lumbar midback tenderness Thrombocytopenia Lft elevation  PLAN:    Continue meropenem for now pending blood culture results Stop vanc Candidaemia is in ddx given persistent fever despite bsAbx, and also vertebral om/epidural abscess is in ddx as well Given progressive thrombocytopenia/lft elevation, we could consider meropenem side effect and change her to IV ciprofloxacin -- will keep entire tx course iv given poor oral absorption with her short gut syndrome Back pain will consider if need to scan for abscess/vertebral om depending on how clinically she does within the next 2 to 3 days   I spent more than 35 minute reviewing data/chart, and coordinating care and >50% direct face to face time providing counseling/discussing diagnostics/treatment plan with patient  Principal Problem:   Sepsis ([place]) Active Problems:   Crohn's disease of both small and large intestine with other complication ([name])   Short gut syndrome   On total parenteral nutrition (TPN)   Central line infection   Bacteremia associated with intravascular line ([name])   CKD (chronic kidney disease) stage 3, GFR 30-59 ml/min ([name])   Malnutrition of moderate degree    MEDICATIONS:    Scheduled Meds:  (feeding supplement) PROSource Plus  30 mL Oral BID BM   amLODipine  10 mg Oral Daily   budesonide  3  mg Oral Q3 days   carvedilol  25 mg Oral BID WC   Chlorhexidine Gluconate Cloth  6 each Topical Daily   cholecalciferol  1,000 Units Oral Daily   cloNIDine  0.2 mg Oral TID   cycloSPORINE  1 drop Both Eyes BID   DULoxetine  60 mg Oral Daily   hydrALAZINE  100 mg Oral TID   levETIRAcetam  500 mg Oral BID   lipase/protease/amylase  36,000 Units Oral TID AC   methocarbamol  500 mg Oral TID   pantoprazole  40 mg Oral Daily   vitamin B-12  1,000 mcg Oral Daily   Continuous Infusions:  sodium chloride 10 mL/hr at 07/07/21 2008   meropenem (MERREM) IV 1 g (07/10/21 0400)   sodium phosphate  Dextrose 5% IVPB 30 mmol (07/10/21 1119)   TPN ADULT (ION) 75 mL/hr at 07/09/21 1814   TPN ADULT (ION)     vancomycin     PRN Meds:.diphenhydrAMINE, famotidine, HYDROmorphone (DILAUDID) injection, naphazoline-glycerin, ondansetron (ZOFRAN) IV, sodium chloride flush  SUBJECTIVE:   24 hour events:  Ongoing fever Patient said she feels miserable Pain all over No rash In increased osteomy output Wbc stable, but platelet dropping; lft worsening as well Cr stable  Review of Systems  All other systems reviewed and are negative.    OBJECTIVE:   Blood pressure (!) 152/59, pulse 88, temperature (!) 100.5 F (38.1 C), temperature source Oral, resp. rate 18, height 5' 8"  (1.727 m), weight 60.6 kg, SpO2 91 %. Body mass index is 20.31 kg/m.  Physical Exam Constitutional:  General: She is not in acute distress.    Appearance: Normal appearance.  HENT:     Head: Normocephalic and atraumatic.  Pulmonary:     Effort: Pulmonary effort is normal. No respiratory distress.  Skin:    General: Skin is warm and dry.     Findings: No rash.     Comments: Right femoral PICC line  Neurological:     General: No focal deficit present.     Mental Status: She is alert and oriented to person, place, and time.  Psychiatric:        Mood and Affect: Mood normal.        Behavior: Behavior normal.     Lab  Results: Lab Results  Component Value Date   WBC 2.9 (L) 07/10/2021   HGB 7.5 (L) 07/10/2021   HCT 23.0 (L) 07/10/2021   MCV 93.1 07/10/2021   PLT 51 (L) 07/10/2021    Lab Results  Component Value Date   NA 134 (L) 07/10/2021   K 4.1 07/10/2021   CO2 21 (L) 07/10/2021   GLUCOSE 148 (H) 07/10/2021   BUN 40 (H) 07/10/2021   CREATININE 1.65 (H) 07/10/2021   CALCIUM 8.9 07/10/2021   GFRNONAA 35 (L) 07/10/2021   GFRAA 55 (L) 08/12/2020    Lab Results  Component Value Date   ALT 281 (H) 07/10/2021   AST 281 (H) 07/10/2021   ALKPHOS 211 (H) 07/10/2021   BILITOT 1.4 (H) 07/10/2021       Component Value Date/Time   CRP 2.8 (H) 07/05/2021 2138       Component Value Date/Time   ESRSEDRATE 33 (H) 07/05/2021 2138     I have reviewed the micro and lab results in Epic.  Imaging: DG Abd Portable 1V  Result Date: 07/08/2021 CLINICAL DATA:  Abdominal pain EXAM: PORTABLE ABDOMEN - 1 VIEW COMPARISON:  07/05/2021 CT FINDINGS: Scattered large and small bowel gas is noted. Right femoral line is noted extending into intrahepatic IVC. Scattered large and small bowel gas is noted. No obstructive changes are seen. Overall appearance is similar to that seen on the previous exam. No bony abnormality is noted. IMPRESSION: No acute abnormality noted. No significant change from the prior exam. Electronically Signed   By: Inez Catalina M.D.   On: 07/08/2021 19:08      8/10 abd pelv ct Unchanged mild wall thickening of the descending colon. No additional areas of bowel wall thickening are seen, although evaluation is somewhat limited due to lack of oral contrast and paucity of intra-abdominal fat.   No evidence of bowel obstruction or intra-abdominal abscess.  Imaging independently reviewed in Epic.          Jabier Mutton, MD [facility] [phone]  pager   [phone] cell 07/10/2021, 3:15 PM   I spent greater than 35 minutes  with the patient including greater than 50% of time in face to face counsel of the patient and in coordination of their care.

## 2021-07-10 NOTE — Progress Notes (Signed)
Received report from 4 th RN. Patient arrived to ICU and noted to be awake but confused. Pt made aware of plan of care and did not express confusion nor understanding. Cooling blanket placed with a sheet between pad and patient. Core temp noted at 103.4 at this time. Cooling blanket set to 99 deg. Pads placed to bony prominens hips and knees while on cooling blanket. On going assessment

## 2021-07-10 NOTE — Progress Notes (Signed)
PROGRESS NOTE  Caitlin Peters DOB: 24-Jun-1961   PCP: [name], [name], MD  Patient is from: Home.  DOA: 07/05/2021 LOS: 5  Chief complaints:  Chief Complaint  Patient presents with   Abdominal Pain     Brief Narrative / Interim history: 60yo with a history of short gut syndrome on chronic TPN via a PICC line, seizure disorder, Crohn's disease, multiple abdominal surgeries, and recent admission/discharge 8/5 > 14/2022 for Acinetobacter bacteremia who returned to the ED with increased generalized abdominal pain and was found to have a low grade temp elevation at 100.4, with concern for CVL infection.  ID consulted.  Started on IV meropenem and vancomycin.  The latter was discontinued per ID recommendation.  ID recommended IV Cipro through 8/17, if blood cultures negative through the weekend.  Blood cultures on 8/10 NGTD.  Patient spiked fever to 103 the evening of 8/13.  She is still on IV meropenem.  Repeat blood cultures obtained.  Added IV vancomycin on 8/14.  Also discontinued Reglan, methadone and Wellbutrin, and reduce Cymbalta to 60 mg daily.   Subjective: Seen and examined earlier this morning.  She says she felt better early in the morning other than feeling cold.  Started spiking fever later in the morning.  She is not hypotensive or tachycardic.  Denies chest pain or dyspnea.  Liver enzymes elevated today.  Objective: Vitals:   07/10/21 0500 07/10/21 0554 07/10/21 0915 07/10/21 1210  BP:   (!) 197/77 (!) 183/67  Pulse:   95 91  Resp:   (!) 21 20  Temp:  98.4 F (36.9 C) (!) 102.8 F (39.3 C) (!) 103 F (39.4 C)  TempSrc:  Oral Oral Oral  SpO2:      Weight: 60.6 kg     Height:        Intake/Output Summary (Last 24 hours) at 07/10/2021 1244 Last data filed at 07/10/2021 0600 Gross per 24 hour  Intake 3056.08 ml  Output --  Net 3056.08 ml   Filed Weights   07/08/21 0509 07/09/21 0607 07/10/21 0500  Weight: 59.2 kg 59.2 kg 60.6 kg     Examination:  GENERAL: Frail and chronically ill-appearing. HEENT: MMM.  Vision and hearing grossly intact.  Her face looks puffy NECK: Supple.  No apparent JVD.  RESP:  No IWOB.  Fair aeration bilaterally. CVS:  RRR. Heart sounds normal.  ABD/GI/GU: BS+. Abd soft, NTND.  MSK/EXT:  Moves extremities.  Significant muscle mass and subcu fat loss. SKIN: no apparent skin lesion or wound NEURO: Awake and alert. Oriented appropriately.  No apparent focal neuro deficit. PSYCH: Calm. Normal affect.   Procedures:  None  Microbiology summarized: ZYSAY-30 and influenza PCR nonreactive. Blood cultures NGTD.  Assessment & Plan: Febrile illness: Initial fever was 100.4 on 8/10.  Rt femoral line last exchangedin 03/2021.  Continues to spike significant fever despite broad-spectrum antibiotics but not hypotensive or tachycardic.  Saturating well on room air.  Has leukopenia but no neutropenic.  Infectious work-up unrevealing so far.  Autonomic dysregulation?  Iatrogenic?   -Discontinued Reglan, Wellbutrin and methadone, and decreased Cymbalta to 60 mg daily -Added clonidine 0.2 mg 3 times daily for possible autonomic dysregulation. -Appreciate recommendation by ID  -Continue IV meropenem -Added IV vancomycin on 8/14 -Monitor fever curve and CBC    Short gut syndrome - complex hx of Crohn's  TPN dependent at home - resume TPN per pharmacy -Home TPN per pharmacy but his LFT might be prohibiting if continues  to rise.  AKI on CKD-3A/azotemia: Relatively stable Recent Labs    06/27/21 0340 06/28/21 0422 06/29/21 0450 06/30/21 0350 07/05/21 0924 07/06/21 0646 07/07/21 0401 07/08/21 0500 07/09/21 0358 07/10/21 0337  BUN 11 13 20  26* 36* 22* 32* 40* 41* 40*  CREATININE 1.21* 1.29* 1.68* 1.56* 1.42* 1.27* 1.89* 1.86* 1.57* 1.65*  -Recheck in the morning -Avoid or minimize nephrotoxic meds.  Pancytopenia: H&H seems to be at baseline.  Thrombocytopenia worse.  Leukopenia  improved. Recent Labs    06/25/21 0325 06/26/21 0410 06/27/21 0340 06/28/21 0422 06/29/21 0450 07/05/21 0924 07/06/21 0646 07/07/21 0401 07/09/21 0358 07/10/21 0337  HGB 7.6* 8.5* 8.0* 8.3* 8.3* 8.3* 8.9* 8.8* 7.8* 7.5*  -Check DIC panel -SCD for VTE prophylaxis.   Transaminitis/mild hyperbilirubinemia/elevated ALP-due to TPN? -Check acute hepatitis panel and RUQ [place].   Hypokalemia/hypophosphatemia: Hypokalemia resolved.  Pharmacy to replenish phosphorus with TPN.  Hyponatremia: Likely hypovolemic. -Continue monitoring  Seizure disorder: Stable. -Cont usual keppra dose    Essential HTN: BP elevated. -Continue amlodipine -Added clonidine   Chronic pain syndrome: On p.o. Dilaudid, methadone and Robaxin at home -Currently on IV Dilaudid and p.o. Robaxin.  Nausea without emesis -IV Zofran as needed.   Mood disorder: -Decreased Cymbalta to 60 mg daily -Discontinued Wellbutrin  Moderate malnutrition Body mass index is 20.31 kg/m. Nutrition Problem: Moderate Malnutrition Etiology: chronic illness Signs/Symptoms: mild fat depletion, mild muscle depletion Interventions: Prostat, TPN   DVT prophylaxis:  Place and maintain sequential compression device Start: 07/09/21 0751 Place TED hose Start: 07/05/21 2202  Code Status: Full code Family Communication: Patient and/or RN. Available if any question.  Level of care: Med-Surg Status is: Inpatient  Remains inpatient appropriate because:IV treatments appropriate due to intensity of illness or inability to take PO and Inpatient level of care appropriate due to severity of illness  Dispo: The patient is from: Home              Anticipated d/c is to: Home              Patient currently is not medically stable to d/c.   Difficult to place patient No       Consultants:  Infectious disease   Sch Meds:  Scheduled Meds:  (feeding supplement) PROSource Plus  30 mL Oral BID BM   amLODipine  10 mg Oral Daily    budesonide  3 mg Oral Q3 days   carvedilol  25 mg Oral BID WC   Chlorhexidine Gluconate Cloth  6 each Topical Daily   cholecalciferol  1,000 Units Oral Daily   cloNIDine  0.2 mg Oral TID   cycloSPORINE  1 drop Both Eyes BID   DULoxetine  60 mg Oral Daily   hydrALAZINE  100 mg Oral TID   levETIRAcetam  500 mg Oral BID   lipase/protease/amylase  36,000 Units Oral TID AC   methocarbamol  500 mg Oral TID   pantoprazole  40 mg Oral Daily   vitamin B-12  1,000 mcg Oral Daily   Continuous Infusions:  sodium chloride 10 mL/hr at 07/07/21 2008   meropenem (MERREM) IV 1 g (07/10/21 0400)   sodium phosphate  Dextrose 5% IVPB 30 mmol (07/10/21 1119)   TPN ADULT (ION) 75 mL/hr at 07/09/21 1814   TPN ADULT (ION)     vancomycin     PRN Meds:.diphenhydrAMINE, famotidine, HYDROmorphone (DILAUDID) injection, naphazoline-glycerin, ondansetron (ZOFRAN) IV, sodium chloride flush  Antimicrobials: Anti-infectives (From admission, onward)    Start     Dose/Rate  Route Frequency Ordered Stop   07/10/21 1600  vancomycin (VANCOREADY) IVPB 750 mg/150 mL        750 mg 150 mL/hr over 60 Minutes Intravenous Every 24 hours 07/09/21 1741     07/09/21 1615  vancomycin (VANCOREADY) IVPB 1250 mg/250 mL        1,250 mg 166.7 mL/hr over 90 Minutes Intravenous  Once 07/09/21 1528 07/09/21 1800   07/06/21 1800  vancomycin (VANCOREADY) IVPB 750 mg/150 mL  Status:  Discontinued        750 mg 150 mL/hr over 60 Minutes Intravenous Every 24 hours 07/05/21 1631 07/06/21 0842   07/06/21 1400  vancomycin (VANCOCIN) IVPB 1000 mg/200 mL premix  Status:  Discontinued        1,000 mg 200 mL/hr over 60 Minutes Intravenous Every 24 hours 07/06/21 0842 07/07/21 1310   07/05/21 1700  meropenem (MERREM) 1 g in sodium chloride 0.9 % 100 mL IVPB        1 g 200 mL/hr over 30 Minutes Intravenous Every 12 hours 07/05/21 1608     07/05/21 1700  vancomycin (VANCOREADY) IVPB 1250 mg/250 mL        1,250 mg 166.7 mL/hr over 90 Minutes  Intravenous  Once 07/05/21 1630 07/05/21 1942        I have personally reviewed the following labs and images: CBC: Recent Labs  Lab 07/05/21 0924 07/06/21 0646 07/07/21 0401 07/09/21 0358 07/10/21 0337  WBC 2.8* 2.3* 1.6* 2.4* 2.9*  NEUTROABS 2.1  --   --   --  2.3  HGB 8.3* 8.9* 8.8* 7.8* 7.5*  HCT 25.1* 26.9* 27.0* 24.3* 23.0*  MCV 91.9 91.2 92.2 93.1 93.1  PLT 137* 124* 120* 81* 65*   BMP &GFR Recent Labs  Lab 07/06/21 0646 07/07/21 0401 07/08/21 0500 07/09/21 0358 07/10/21 0337  NA 137 134* 134* 138 134*  K 3.6 3.3* 3.5 3.6 4.1  CL 99 100 100 107 109  CO2 27 27 27 23  21*  GLUCOSE 97 119* 123* 141* 148*  BUN 22* 32* 40* 41* 40*  CREATININE 1.27* 1.89* 1.86* 1.57* 1.65*  CALCIUM 9.3 9.0 8.9 8.9 8.9  MG  --  1.8 2.4 2.0 1.8  PHOS  --  4.5 5.1* 2.7 1.8*   Estimated Creatinine Clearance: 34.7 mL/min (A) (by C-G formula based on SCr of 1.65 mg/dL (H)). Liver & Pancreas: Recent Labs  Lab 07/05/21 0924 07/06/21 0646 07/07/21 0401 07/08/21 0500 07/09/21 0358 07/10/21 0337  AST 23 29 49* 52*  --  281*  ALT 23 32 49* 61*  --  281*  ALKPHOS 92 96 113 102  --  211*  BILITOT 0.5 0.7 0.6 0.4  --  1.4*  PROT 6.2* 6.0* 5.6* 5.3*  --  5.2*  ALBUMIN 2.9* 2.7* 2.5* 2.3* 2.3* 2.2*   Recent Labs  Lab 07/05/21 0924  LIPASE 38   No results for input(s): AMMONIA in the last 168 hours. Diabetic: No results for input(s): HGBA1C in the last 72 hours. No results for input(s): GLUCAP in the last 168 hours. Cardiac Enzymes: Recent Labs  Lab 07/08/21 1820  CKTOTAL 16*   No results for input(s): PROBNP in the last 8760 hours. Coagulation Profile: No results for input(s): INR, PROTIME in the last 168 hours. Thyroid Function Tests: No results for input(s): TSH, T4TOTAL, FREET4, T3FREE, THYROIDAB in the last 72 hours.  Lipid Profile: Recent Labs    07/08/21 0500 07/10/21 0337  TRIG 71 88   Anemia Panel: No  results for input(s): VITAMINB12, FOLATE, FERRITIN,  TIBC, IRON, RETICCTPCT in the last 72 hours. Urine analysis:    Component Value Date/Time   COLORURINE YELLOW 06/19/2021 2145   APPEARANCEUR CLEAR 06/19/2021 2145   LABSPEC 1.015 06/19/2021 2145   PHURINE 6.0 06/19/2021 2145   GLUCOSEU NEGATIVE 06/19/2021 2145   HGBUR NEGATIVE 06/19/2021 2145   BILIRUBINUR NEGATIVE 06/19/2021 2145   [place] NEGATIVE 06/19/2021 2145   PROTEINUR 100 (A) 06/19/2021 2145   NITRITE NEGATIVE 06/19/2021 2145   LEUKOCYTESUR NEGATIVE 06/19/2021 2145   Sepsis Labs: Invalid input(s): PROCALCITONIN, [place]  Microbiology: Recent Results (from the past 240 hour(s))  Blood culture (routine x 2)     Status: None   Collection Time: 07/05/21  8:45 AM   Specimen: BLOOD  Result Value Ref Range Status   Specimen Description   Final    BLOOD BLOOD RIGHT FOREARM Performed at [facility] [address] [place], [place] [address]    Special Requests   Final    BOTTLES DRAWN AEROBIC AND ANAEROBIC Blood Culture results may not be optimal due to an inadequate volume of blood received in culture bottles Performed at [facility] [address] [place], [place] [address]    Culture   Final    NO GROWTH 5 DAYS Performed at [facility], [place] [address] [place], [place] [address]    Report Status 07/10/2021 FINAL  Final  Blood culture (routine x 2)     Status: None   Collection Time: 07/05/21  9:10 AM   Specimen: BLOOD LEFT HAND  Result Value Ref Range Status   Specimen Description   Final    BLOOD LEFT HAND Performed at [facility] [address], [place], [place] [address]    Special Requests   Final    BOTTLES DRAWN AEROBIC AND ANAEROBIC Blood Culture adequate volume Performed at [facility] [address], [place], [place] [address]    Culture   Final    NO GROWTH 5 DAYS Performed at [facility], [place] [address], [place], [place] [address]    Report  Status 07/10/2021 FINAL  Final  Resp Panel by RT-PCR (Flu A&B, Covid) Nasopharyngeal Swab     Status: None   Collection Time: 07/05/21  9:24 AM   Specimen: Nasopharyngeal Swab; Nasopharyngeal(NP) swabs in vial transport medium  Result Value Ref Range Status   SARS Coronavirus 2 by RT PCR NEGATIVE NEGATIVE Final    Comment: (NOTE) SARS-CoV-2 target nucleic acids are NOT DETECTED.  The SARS-CoV-2 RNA is generally detectable in upper respiratory specimens during the acute phase of infection. The lowest concentration of SARS-CoV-2 viral copies this assay can detect is 138 copies/mL. A negative result does not preclude SARS-Cov-2 infection and should not be used as the sole basis for treatment or other patient management decisions. A negative result may occur with  improper specimen collection/handling, submission of specimen other than nasopharyngeal swab, presence of viral mutation(s) within the areas targeted by this assay, and inadequate number of viral copies(<138 copies/mL). A negative result must be combined with clinical observations, patient history, and epidemiological information. The expected result is Negative.  Fact Sheet for Patients:  EntrepreneurPulse.com.au  Fact Sheet for Healthcare Providers:  IncredibleEmployment.be  This test is no t yet approved or cleared by the [place] FDA and  has been authorized for detection and/or diagnosis of SARS-CoV-2 by FDA under an Emergency Use Authorization (EUA). This EUA will  remain  in effect (meaning this test can be used) for the duration of the COVID-19 declaration under Section 564(b)(1) of the Act, 21 U.S.C.section 360bbb-3(b)(1), unless the authorization is terminated  or revoked sooner.       Influenza A by PCR NEGATIVE NEGATIVE Final   Influenza B by PCR NEGATIVE NEGATIVE Final    Comment: (NOTE) The Xpert Xpress SARS-CoV-2/FLU/RSV plus assay is intended as an aid in the  diagnosis of influenza from Nasopharyngeal swab specimens and should not be used as a sole basis for treatment. Nasal washings and aspirates are unacceptable for Xpert Xpress SARS-CoV-2/FLU/RSV testing.  Fact Sheet for Patients: EntrepreneurPulse.com.au  Fact Sheet for Healthcare Providers: IncredibleEmployment.be  This test is not yet approved or cleared by the [place] FDA and has been authorized for detection and/or diagnosis of SARS-CoV-2 by FDA under an Emergency Use Authorization (EUA). This EUA will remain in effect (meaning this test can be used) for the duration of the COVID-19 declaration under Section 564(b)(1) of the Act, 21 U.S.C. section 360bbb-3(b)(1), unless the authorization is terminated or revoked.  Performed at [facility], [place] [address] [place], [place] [address]   Culture, blood (routine x 2)     Status: None (Preliminary result)   Collection Time: 07/09/21  8:17 AM   Specimen: BLOOD  Result Value Ref Range Status   Specimen Description   Final    BLOOD BLOOD RIGHT HAND Performed at [facility] [address], [place], [place] [address]    Special Requests   Final    BOTTLES DRAWN AEROBIC ONLY Blood Culture results may not be optimal due to an inadequate volume of blood received in culture bottles Performed at [facility] [address], [place], [place] [address]    Culture   Final    NO GROWTH < 24 HOURS Performed at [facility] [address] [place], [place] [address]    Report Status PENDING  Incomplete  Culture, blood (routine x 2)     Status: None (Preliminary result)   Collection Time: 07/09/21  8:17 AM   Specimen: BLOOD  Result Value Ref Range Status   Specimen Description   Final    BLOOD RIGHT ANTECUBITAL Performed at [facility] [address] [place], [place] [address]    Special Requests   Final     BOTTLES DRAWN AEROBIC ONLY Blood Culture results may not be optimal due to an inadequate volume of blood received in culture bottles Performed at [facility] [address], [place], [place] [address]    Culture   Final    NO GROWTH < 24 HOURS Performed at [facility] [address], [place], [place] [address]    Report Status PENDING  Incomplete    Radiology Studies: No results found.    Vedansh Kerstetter T. [place]  If 7PM-7AM, please contact night-coverage [email or web address] 07/10/2021, 12:44 PM

## 2021-07-10 NOTE — Plan of Care (Signed)
On going assessment

## 2021-07-10 NOTE — Progress Notes (Signed)
Pharmacy notified of yeast in blood culture. ID aware, treating with antifungals. Per ID, needs line holiday; hold TPN.  Have discontinued TPN orders for 8/15 PM; will leave standing labs and overnight labs for tonight to facilitate electrolyte replacement via peripheral IV.   Reuel Boom, PharmD, BCPS [phone] 07/10/2021, 5:26 PM

## 2021-07-11 ENCOUNTER — Inpatient Hospital Stay (HOSPITAL_COMMUNITY): Payer: Medicare Other

## 2021-07-11 DIAGNOSIS — T80211A Bloodstream infection due to central venous catheter, initial encounter: Secondary | ICD-10-CM

## 2021-07-11 DIAGNOSIS — A419 Sepsis, unspecified organism: Secondary | ICD-10-CM | POA: Diagnosis not present

## 2021-07-11 DIAGNOSIS — R509 Fever, unspecified: Secondary | ICD-10-CM | POA: Diagnosis not present

## 2021-07-11 DIAGNOSIS — B377 Candidal sepsis: Secondary | ICD-10-CM | POA: Diagnosis not present

## 2021-07-11 DIAGNOSIS — R6520 Severe sepsis without septic shock: Secondary | ICD-10-CM | POA: Diagnosis not present

## 2021-07-11 DIAGNOSIS — T80211D Bloodstream infection due to central venous catheter, subsequent encounter: Secondary | ICD-10-CM | POA: Diagnosis not present

## 2021-07-11 DIAGNOSIS — M7989 Other specified soft tissue disorders: Secondary | ICD-10-CM | POA: Diagnosis not present

## 2021-07-11 DIAGNOSIS — Z789 Other specified health status: Secondary | ICD-10-CM | POA: Diagnosis not present

## 2021-07-11 DIAGNOSIS — T80219D Unspecified infection due to central venous catheter, subsequent encounter: Secondary | ICD-10-CM | POA: Diagnosis not present

## 2021-07-11 DIAGNOSIS — E440 Moderate protein-calorie malnutrition: Secondary | ICD-10-CM | POA: Diagnosis not present

## 2021-07-11 HISTORY — PX: IR REMOVAL TUN CV CATH W/O FL: IMG2289

## 2021-07-11 LAB — CBC
HCT: 19.8 % — ABNORMAL LOW (ref 36.0–46.0)
Hemoglobin: 6.5 g/dL — CL (ref 12.0–15.0)
MCH: 30.5 pg (ref 26.0–34.0)
MCHC: 32.8 g/dL (ref 30.0–36.0)
MCV: 93 fL (ref 80.0–100.0)
Platelets: 37 10*3/uL — ABNORMAL LOW (ref 150–400)
RBC: 2.13 MIL/uL — ABNORMAL LOW (ref 3.87–5.11)
RDW: 14.1 % (ref 11.5–15.5)
WBC: 2.3 10*3/uL — ABNORMAL LOW (ref 4.0–10.5)
nRBC: 0.9 % — ABNORMAL HIGH (ref 0.0–0.2)

## 2021-07-11 LAB — COMPREHENSIVE METABOLIC PANEL
ALT: 215 U/L — ABNORMAL HIGH (ref 0–44)
AST: 141 U/L — ABNORMAL HIGH (ref 15–41)
Albumin: 1.9 g/dL — ABNORMAL LOW (ref 3.5–5.0)
Alkaline Phosphatase: 177 U/L — ABNORMAL HIGH (ref 38–126)
Anion gap: 17 — ABNORMAL HIGH (ref 5–15)
BUN: 30 mg/dL — ABNORMAL HIGH (ref 6–20)
CO2: 19 mmol/L — ABNORMAL LOW (ref 22–32)
Calcium: 7.7 mg/dL — ABNORMAL LOW (ref 8.9–10.3)
Chloride: 102 mmol/L (ref 98–111)
Creatinine, Ser: 1.46 mg/dL — ABNORMAL HIGH (ref 0.44–1.00)
GFR, Estimated: 41 mL/min — ABNORMAL LOW (ref >60)
Glucose, Bld: 380 mg/dL — ABNORMAL HIGH (ref 70–99)
Potassium: 3.4 mmol/L — ABNORMAL LOW (ref 3.5–5.1)
Sodium: 138 mmol/L (ref 135–145)
Total Bilirubin: 1.9 mg/dL — ABNORMAL HIGH (ref 0.3–1.2)
Total Protein: 4.7 g/dL — ABNORMAL LOW (ref 6.5–8.1)

## 2021-07-11 LAB — FIBRINOGEN: Fibrinogen: 398 mg/dL (ref 210–475)

## 2021-07-11 LAB — MAGNESIUM: Magnesium: 1.7 mg/dL (ref 1.7–2.4)

## 2021-07-11 LAB — PREPARE RBC (CROSSMATCH)

## 2021-07-11 LAB — HEMOGLOBIN AND HEMATOCRIT, BLOOD
HCT: 24.3 % — ABNORMAL LOW (ref 36.0–46.0)
Hemoglobin: 8 g/dL — ABNORMAL LOW (ref 12.0–15.0)

## 2021-07-11 LAB — GLUCOSE, CAPILLARY
Glucose-Capillary: 120 mg/dL — ABNORMAL HIGH (ref 70–99)
Glucose-Capillary: 126 mg/dL — ABNORMAL HIGH (ref 70–99)

## 2021-07-11 LAB — PHOSPHORUS
Phosphorus: 21.6 mg/dL — ABNORMAL HIGH (ref 2.5–4.6)
Phosphorus: 2.9 mg/dL (ref 2.5–4.6)

## 2021-07-11 MED ORDER — LIDOCAINE HCL 1 % IJ SOLN
INTRAMUSCULAR | Status: AC
  Filled 2021-07-11: qty 20

## 2021-07-11 MED ORDER — POTASSIUM CHLORIDE CRYS ER 20 MEQ PO TBCR
40.0000 meq | EXTENDED_RELEASE_TABLET | Freq: Once | ORAL | Status: AC
  Administered 2021-07-11: 40 meq via ORAL
  Filled 2021-07-11: qty 2

## 2021-07-11 MED ORDER — MAGNESIUM SULFATE 2 GM/50ML IV SOLN
2.0000 g | Freq: Once | INTRAVENOUS | Status: AC
  Administered 2021-07-11: 2 g via INTRAVENOUS
  Filled 2021-07-11: qty 50

## 2021-07-11 MED ORDER — SODIUM CHLORIDE 0.9% IV SOLUTION: Freq: Once | INTRAVENOUS | Status: AC

## 2021-07-11 MED ORDER — KCL IN DEXTROSE-NACL 20-5-0.9 MEQ/L-%-% IV SOLN
INTRAVENOUS | Status: DC
  Filled 2021-07-11 (×4): qty 1000

## 2021-07-11 MED ORDER — CARVEDILOL 6.25 MG PO TABS
6.2500 mg | ORAL_TABLET | Freq: Two times a day (BID) | ORAL | Status: DC
  Administered 2021-07-11 – 2021-07-14 (×7): 6.25 mg via ORAL
  Filled 2021-07-11 (×7): qty 1

## 2021-07-11 MED ORDER — OXYCODONE HCL 5 MG PO TABS
10.0000 mg | ORAL_TABLET | Freq: Once | ORAL | Status: AC
  Administered 2021-07-11: 10 mg via ORAL
  Filled 2021-07-11: qty 2

## 2021-07-11 NOTE — TOC Progression Note (Signed)
Transition of Care [facility]) - Progression Note    Patient Details  Name: Caitlin Peters MRN: [id number] Date of Birth: 26-Oct-1961  Transition of Care [facility]) CM/SW Contact  Leeroy Cha, RN Phone Number: 07/11/2021, 7:56 AM  Clinical Narrative:    Temp=104.6,wbc 2.3 hgb 6.5 -one unit prbc to be given, bun 30/creat.=1.46 liver enz elevated, on iv eraxis, cipro, d5ns at 100cc/hr.     Barriers to Discharge: Continued Medical Work up  Expected Discharge Plan and Services           Expected Discharge Date:  (unknown)                                     Social Determinants of Health (SDOH) Interventions    Readmission Risk Interventions Readmission Risk Prevention Plan 06/22/2021 02/10/2021 12/21/2020  Transportation Screening Complete Complete Complete  PCP or Specialist Appt within 3-5 Days - - -  Not Complete comments - - -  HRI or [place] Work Consult for [place] Planning/Counseling - - -  SW consult not completed comments - - -  Palliative Care Screening - - -  Medication Review Press photographer) Complete Complete -  PCP or Specialist appointment within 3-5 days of discharge Complete - Complete  HRI or Home Care Consult Complete Complete Complete  SW Recovery Care/Counseling Consult Complete Complete Complete  Palliative Care Screening Not Applicable Not Applicable -  [place] Not Applicable Not Applicable -

## 2021-07-11 NOTE — Procedures (Addendum)
Patient's right femoral vein tunneled catheter was removed in its entirety without immediate complications.  Gauze dressing was applied over site.  EBL none.

## 2021-07-11 NOTE — Progress Notes (Signed)
Bladder scanned patient after no output all shift. Bladder volume was 82m. Straight cath performed. 6064mwere removed. Bladder scan post straight cath was 0.

## 2021-07-11 NOTE — Progress Notes (Signed)
Nutrition Follow-up  DOCUMENTATION CODES:   Non-severe (moderate) malnutrition in context of chronic illness  INTERVENTION:  - will monitor for ability to resume TPN. - will monitor for ability to allow PO nutrition pending improvement in mentation.   NUTRITION DIAGNOSIS:   Moderate Malnutrition related to chronic illness as evidenced by mild fat depletion, mild muscle depletion. -ongoing  GOAL:   Patient will meet greater than or equal to 90% of their needs -unmet, unable to meet at this time.  MONITOR:   PO intake, Supplement acceptance, Labs, Weight trends, I & O's, Other (Comment) (TPN regimen)  ASSESSMENT:   60 y.o. 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 She has not been feeling well.  She takes a lot of medicines.  She is lost 5 pounds since discharge despite being on TPN and tolerating some food orally.  Patient sleeping soundly at this time. No family or visitors present. She is noted to be a/o to self only.   Able to talk with RN who reports that patient is currently being kept NPO d/t mental status. She was moved from 4W to 2W yesterday at 2230.  Weight has been stable since 8/11. As of 0200 today she was noted to have no edema. She is noted to be +10.975 L since admission.   She was receiving TPN at 75 ml/hr to provide 1818 kcal and 90 grams protein from 8/12-8/15 at ~1700. Pharmacy was notified yesterday at ~1700 that TPN would need to be held d/t yeast in blood and plan for PICC  removal.     Labs reviewed; K: 3.4 mmol/l, BUN: 30 mg/dl, creatinine: 1.46 mg/dl, Ca: 7.7 mg/dl, LFTs elevated but trending down, GFR: 41 ml/min.  Medications reviewed; 1000 units cholecalciferol/day, 40 mg oral protonix/day, 40 mEq Klor-Con x1 dose 8/16, 30 mmol IV NaPhos x1 run 8/15, 1000 mcg oral cyanocobalamin/day.  IVF; D5-NS-20 mEq KCl @ 100 ml/hr (408 kcal/24 hrs).     Diet Order:   Diet Order             Diet regular Room service appropriate? Yes; Fluid consistency: Thin  Diet effective now                   EDUCATION NEEDS:   Education needs have been addressed  Skin:  Skin Assessment: Skin Integrity Issues: Skin Integrity Issues:: Stage I Stage I: medial sacrum (newly documented 8/14)  Last BM:  8/16 (type 7 x1)  Height:   Ht Readings from Last 1 Encounters:  07/05/21 5' 8"  (1.727 m)    Weight:   Wt Readings from Last 1 Encounters:  07/10/21 60.6 kg     Estimated Nutritional Needs:  Kcal:  1800-2000 Protein:  90-105 grams Fluid:  >1.8 L      Jarome Matin, MS, RD, LDN, CNSC Inpatient Clinical Dietitian RD pager # available in [place]  After hours/weekend pager # available in [facility]

## 2021-07-11 NOTE — Progress Notes (Signed)
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  Right femoral line last exchanged May 2022; removed again in setting fungemia on 8/16. 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 Fever ongoing Lumbar midback tenderness - hx discitis as mentioned above Thrombocytopenia/lft elevation - improved lft but platelet still down trending --> sepsis (schistocytes seen) vs medication associated  PLAN:    Transitioned meropenem to cipro -- finish 7 more days until 8/23 (has had 2 weeks abx but line removed so will do 7 days from line removal) Continue eraxis for now -- anticipate transitioning to fluconazole to finish 2 week course if tte negative Limited echo to evaluate for new valve vegetation Repeat blood cx to document clearance of fungemia Need outpatient f/u ophthalmology to evaluate for candida uveitis where longer course of treatment will be required Hx discitis/om with persistent back pain; plan to f/u in id clinic and reevalute based on crp/clinical status. No need for repeat imaging now  Discussed case with her daughter at length   I spent more than 35 minute reviewing data/chart, and coordinating care and >50% direct face to face time providing counseling/discussing diagnostics/treatment plan with patient  Principal Problem:   Sepsis ([place]) Active Problems:   Crohn's disease of both small and large intestine with other complication ([place])   Short gut  syndrome   On total parenteral nutrition (TPN)   Central line infection   Bacteremia associated with intravascular line ([place])   CKD (chronic kidney disease) stage 3, GFR 30-59 ml/min ([name])   Malnutrition of moderate degree   Thrombocytopenia ([name])    MEDICATIONS:    Scheduled Meds:  (feeding supplement) PROSource Plus  30 mL Oral BID BM   amLODipine  10 mg Oral Daily   budesonide  3 mg Oral Q3 days   carvedilol  6.25 mg Oral BID WC   Chlorhexidine Gluconate Cloth  6 each Topical Daily   cholecalciferol  1,000 Units Oral Daily   cycloSPORINE  1 drop Both Eyes BID   DULoxetine  60 mg Oral Daily   hydrALAZINE  100 mg Oral TID   levETIRAcetam  500 mg Oral BID   lipase/protease/amylase  36,000 Units Oral TID AC   methocarbamol  500 mg Oral TID   pantoprazole  40 mg Oral Daily   vitamin B-12  1,000 mcg Oral Daily   Continuous Infusions:  sodium chloride 10 mL/hr at 07/07/21 2008   anidulafungin     ciprofloxacin Stopped (07/11/21 0744)   dextrose 5 % and 0.9 % NaCl with KCl 20 mEq/L 100 mL/hr at 07/11/21 1326   PRN Meds:.diphenhydrAMINE, famotidine, hydrALAZINE, HYDROmorphone (DILAUDID) injection, naphazoline-glycerin, ondansetron (ZOFRAN) IV, sodium chloride flush  SUBJECTIVE:   24 hour events:  Improved lft Platelet still down trending Some schistocytes on smear Cr improving Mentation much better since starting antifungal Line removed today  Non/v/diarrhea/rash   Review of Systems  All other systems reviewed and are negative.    OBJECTIVE:   Blood pressure (!) 127/58, pulse 68, temperature  98.1 F (36.7 C), temperature source Oral, resp. rate 15, height 5' 8"  (1.727 m), weight 60.6 kg, SpO2 97 %. Body mass index is 20.31 kg/m.  General/constitutional: no distress, pleasant HEENT: Normocephalic, PER, Conj Clear, EOMI, Oropharynx clear Neck supple CV: rrr no mrg Lungs: clear to auscultation, normal respiratory effort Abd: Soft, Nontender Ext: no  edema Skin: No Rash Neuro: nonfocal MSK: no peripheral joint swelling/tenderness/warmth; back spines nontender   Central line presence: previous site right femoral catheter no swelling/tenderness    Lab Results: Lab Results  Component Value Date   WBC 2.3 (L) 07/11/2021   HGB 8.0 (L) 07/11/2021   HCT 24.3 (L) 07/11/2021   MCV 93.0 07/11/2021   PLT 37 (L) 07/11/2021    Lab Results  Component Value Date   NA 138 07/11/2021   K 3.4 (L) 07/11/2021   CO2 19 (L) 07/11/2021   GLUCOSE 380 (H) 07/11/2021   BUN 30 (H) 07/11/2021   CREATININE 1.46 (H) 07/11/2021   CALCIUM 7.7 (L) 07/11/2021   GFRNONAA 41 (L) 07/11/2021   GFRAA 55 (L) 08/12/2020    Lab Results  Component Value Date   ALT 215 (H) 07/11/2021   AST 141 (H) 07/11/2021   ALKPHOS 177 (H) 07/11/2021   BILITOT 1.9 (H) 07/11/2021       Component Value Date/Time   CRP 2.8 (H) 07/05/2021 2138       Component Value Date/Time   ESRSEDRATE 33 (H) 07/05/2021 2138     I have reviewed the micro and lab results in Epic.  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 PROCEDURE: Informed consent was obtained from the patient after a thorough discussion of the procedural risks, benefits and alternatives. All questions were addressed. Maximal Sterile Barrier Technique was utilized including caps, mask, sterile gowns, sterile gloves, sterile drape, hand hygiene and skin antiseptic. A timeout was performed prior to the initiation of the procedure. [name] right upper thigh region and catheter was prepped and  draped in a normal sterile fashion. Sutures were removed and using gentle manual traction the cuff of the catheter was exposed and the catheter was removed in it's entirety. Pressure was held till hemostasis was obtained. A sterile dressing was applied. The patient tolerated the procedure well with no immediate complications. IMPRESSION: Successful catheter removal as described above. Read by: Rowe Robert, PA-C Electronically Signed   By: Markus Daft M.D.   On: 07/11/2021 10:03   VAS [place] LOWER EXTREMITY VENOUS (DVT)  Result Date: 07/11/2021  Lower Venous DVT Study Patient Name:  Caitlin Peters  Date of Exam:   07/11/2021 Medical Rec #: [id number]         Accession #:    [id number] Date of Birth: 1961-10-05         Patient Gender: F Patient Age:   60 years Exam Location:  [facility] Procedure:      VAS [place] LOWER EXTREMITY VENOUS (DVT) Referring Phys: Bretta Bang GONFA --------------------------------------------------------------------------------  Indications: Swelling.  Risk Factors: PICC placement RLE. Comparison Study: Previous exam 05/07/21 - negative Performing Technologist: Jody Hill RVT, RDMS  Examination Guidelines: A complete evaluation includes B-mode imaging, spectral Doppler,  color Doppler, and power Doppler as needed of all accessible portions of each vessel. Bilateral testing is considered an integral part of a complete examination. Limited examinations for reoccurring indications may be performed as noted. The reflux portion of the exam is performed with the patient in reverse Trendelenburg.  +---------+---------------+---------+-----------+----------+--------------+ RIGHT    CompressibilityPhasicitySpontaneityPropertiesThrombus Aging +---------+---------------+---------+-----------+----------+--------------+ CFV      Full           Yes      Yes                                 +---------+---------------+---------+-----------+----------+--------------+ SFJ      Full                                                         +---------+---------------+---------+-----------+----------+--------------+ FV Prox  Full           Yes      Yes                                 +---------+---------------+---------+-----------+----------+--------------+ FV Mid   Full           Yes      Yes                                 +---------+---------------+---------+-----------+----------+--------------+ FV DistalFull           Yes      Yes                                 +---------+---------------+---------+-----------+----------+--------------+ PFV      Full                                                        +---------+---------------+---------+-----------+----------+--------------+ POP      Full           Yes      Yes                                 +---------+---------------+---------+-----------+----------+--------------+ PTV      Full                                                        +---------+---------------+---------+-----------+----------+--------------+ PERO     Full                                                        +---------+---------------+---------+-----------+----------+--------------+   +---------+---------------+---------+-----------+----------+--------------+ LEFT     CompressibilityPhasicitySpontaneityPropertiesThrombus Aging +---------+---------------+---------+-----------+----------+--------------+ CFV  Full           Yes      Yes                                 +---------+---------------+---------+-----------+----------+--------------+ SFJ      Full                                                        +---------+---------------+---------+-----------+----------+--------------+ FV Prox  Full           Yes      Yes                                 +---------+---------------+---------+-----------+----------+--------------+ FV Mid   Full           Yes      Yes                                  +---------+---------------+---------+-----------+----------+--------------+ FV DistalFull           Yes      Yes                                 +---------+---------------+---------+-----------+----------+--------------+ PFV      Full                                                        +---------+---------------+---------+-----------+----------+--------------+ POP      Full           Yes      Yes                                 +---------+---------------+---------+-----------+----------+--------------+ PTV      Full                                                        +---------+---------------+---------+-----------+----------+--------------+ PERO     Full                                                        +---------+---------------+---------+-----------+----------+--------------+     Summary: BILATERAL: - No evidence of deep vein thrombosis seen in the lower extremities, bilaterally. - No evidence of superficial venous thrombosis in the lower extremities, bilaterally. -No evidence of popliteal cyst, bilaterally.   *See table(s) above for measurements and observations.    Preliminary    US ABDOMEN LIMITED RUQ (LIVER/GB)  Result Date: 07/10/2021 CLINICAL DATA:  Elevated liver enzyme EXAM: ULTRASOUND ABDOMEN LIMITED RIGHT UPPER QUADRANT COMPARISON:  CT 07/05/2021,  ultrasound 05/10/2021 FINDINGS: Gallbladder: Surgically absent Common bile duct: Diameter: 6.6 mm Liver: No focal lesion identified. Within normal limits in parenchymal echogenicity. Portal vein is patent on color Doppler imaging with normal direction of blood flow towards the liver. Other: Right kidney appears echogenic. Small amount of right upper quadrant ascites IMPRESSION: 1. Status post cholecystectomy. 2. Trace ascites 3. Right kidney appears echogenic, correlate with appropriate laboratory values Electronically Signed   By: Donavan Foil M.D.   On: 07/10/2021 19:34      8/10 abd pelv ct Unchanged  mild wall thickening of the descending colon. No additional areas of bowel wall thickening are seen, although evaluation is somewhat limited due to lack of oral contrast and paucity of intra-abdominal fat.   No evidence of bowel obstruction or intra-abdominal abscess.  Imaging independently reviewed in Epic.          Jabier Mutton, [place] [facility] [phone]  pager   [phone] cell 07/11/2021, 3:16 PM

## 2021-07-11 NOTE — Progress Notes (Signed)
BLE venous duplex has been completed.  Results can be found under chart review under CV PROC. 07/11/2021 10:48 AM Marliss Buttacavoli RVT, RDMS

## 2021-07-11 NOTE — Progress Notes (Signed)
PROGRESS NOTE  Caitlin Peters DOB: 1961/03/25   PCP: [name], [name], MD  Patient is from: Home.  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  ID consulted.  Started on IV meropenem and vancomycin, but she continued to spike fever and had encephalopathy.  Repeat blood culture on 8/14 with candidemia.  Meropenem and vancomycin discontinued.  Started on Eraxis on 8/15.  Central line removed on 8/16.  ID recommended line holiday for 2 days.  TPN on hold.  Also discontinued Reglan, methadone and Wellbutrin, and reduced Cymbalta to 60 mg daily.   Subjective: Seen and examined earlier this morning.  Patient had worsening mental status with fever, tachycardia and tachypnea and transferred to stepdown unit overnight.  Feels better this morning.  She is awake and oriented to self, place and situation but not time.  Reports pain in her right leg after central line was removed.  Also reports back pain but unchanged from her baseline.  She denies chest pain, dyspnea, nausea or vomiting.  She reports abdominal tenderness.  She is in agreement with blood transfusion.  She says she had blood transfusion in the past when she was in [place].  Objective: Vitals:   07/11/21 1200 07/11/21 1300 07/11/21 1307 07/11/21 1400  BP: (!) 111/45 (!) 111/51 (!) 117/54 (!) 127/58  Pulse: (!) 59 65 65 68  Resp: 18 19 18 15   Temp: (!) 97.5 F (36.4 C)  98.1 F (36.7 C)   TempSrc: Axillary  Oral   SpO2: 98% 95% 96% 97%  Weight:       Height:        Intake/Output Summary (Last 24 hours) at 07/11/2021 1436 Last data filed at 07/11/2021 1307 Gross per 24 hour  Intake 3139.89 ml  Output 1 ml  Net 3138.89 ml   Filed Weights   07/08/21 0509 07/09/21 0607 07/10/21 0500  Weight: 59.2 kg 59.2 kg 60.6 kg    Examination:  GENERAL: Frail and chronically ill-appearing.  No apparent distress. HEENT: MMM.  Vision and hearing grossly intact.  NECK: Supple.  No apparent JVD.  RESP:  No IWOB.  Fair aeration bilaterally. CVS:  RRR. Heart sounds normal.  ABD/GI/GU: BS+. Abd soft.  Diffuse tenderness to palpation. MSK/EXT:  Moves extremities.  Significant muscle mass and subcu fat loss. SKIN: no apparent skin lesion or wound NEURO: Awake and alert. Oriented to self, place and situation.  No apparent focal neuro deficit. PSYCH: Calm. Normal affect.   Procedures:  None  Microbiology summarized: [id number]-50 and influenza PCR nonreactive. Blood cultures with Candida parapsilosis  Assessment & Plan: Severe sepsis due to candidemia-patient with TPN and femoral central line.   -ID following-started Eraxis on 8/15. -Central line removed.  Line holiday for 48 hours. -TPN on hold.    Short gut syndrome - complex hx of Crohn's  -TPN on hold in the setting of the above. -Continue D5-NS-KCl -Continue oral diet. -Continue home Creon -May consider antispasmodic as needed for diarrhea.  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 Recent Labs  06/28/21 0422 06/29/21 0450 06/30/21 0350 07/05/21 0924 07/06/21 0646 07/07/21 0401 07/08/21 0500 07/09/21 0358 07/10/21 0337 07/11/21 0300  BUN 13 20 26* 36* 22* 32* 40* 41* 40* 30*  CREATININE 1.29* 1.68* 1.56* 1.42* 1.27* 1.89* 1.86* 1.57* 1.65* 1.46*  -IV fluid as above -Recheck in the morning -Avoid or minimize nephrotoxic meds.  Pancytopenia: Felt to be due to meropenem.  Recent Labs    06/26/21 0410 06/27/21 0340 06/28/21 0422 06/29/21 0450 07/05/21 0924 07/06/21 0646  07/07/21 0401 07/09/21 0358 07/10/21 0337 07/11/21 0300  HGB 8.5* 8.0* 8.3* 8.3* 8.3* 8.9* 8.8* 7.8* 7.5* 6.5*  -Transfuse 1 unit-verbally consented. -SCD for VTE prophylaxis. -Monitor daily.   Transaminitis/mild hyperbilirubinemia/elevated ALP-likely due to TPN and candidemia.  Improved. -Continue monitoring  Elevated D-dimer: Likely from sepsis and candidemia.  LE [place] negative for DVT.  She has no respiratory symptoms to suggest central PE.    Hypokalemia/hypophosphatemia: K3.4.  PT 21.6 but 2.9 on repeat. -K-Dur 40 mill equivalent x1  Hyperglycemia: Due to D5? -Check CBG.  If elevated, discontinue D5 and start SSI.  Hyponatremia: Resolved.  Seizure disorder: Stable. -Cont usual keppra dose    Essential hypertension: Now normotensive, slightly soft. -Stop clonidine -Decrease Coreg to 6.25 mg twice daily -Continue amlodipine and hydralazine. -Further adjustment as appropriate   Chronic pain syndrome: On p.o. Dilaudid, methadone and Robaxin at home -Currently on IV Dilaudid and p.o. Robaxin.  Nausea without emesis -IV Zofran as needed.   Mood disorder: -Decreased Cymbalta to 60 mg daily -Discontinued Wellbutrin  Moderate malnutrition Body mass index is 20.31 kg/m. Nutrition Problem: Moderate Malnutrition Etiology: chronic illness Signs/Symptoms: mild fat depletion, mild muscle depletion Interventions: Prostat, TPN   DVT prophylaxis:  Place and maintain sequential compression device Start: 07/09/21 0751 Place TED hose Start: 07/05/21 2202  Code Status: Full code Family Communication: Patient and/or RN. Available if any question.  Level of care: Stepdown Status is: Inpatient  Remains inpatient appropriate because:IV treatments appropriate due to intensity of illness or inability to take PO and Inpatient level of care appropriate due to severity of illness  Dispo: The patient is from: Home              Anticipated d/c is to: Home              Patient  currently is not medically stable to d/c.   Difficult to place patient No       Consultants:  Infectious disease   Sch Meds:  Scheduled Meds:  (feeding supplement) PROSource Plus  30 mL Oral BID BM   amLODipine  10 mg Oral Daily   budesonide  3 mg Oral Q3 days   carvedilol  6.25 mg Oral BID WC   Chlorhexidine Gluconate Cloth  6 each Topical Daily   cholecalciferol  1,000 Units Oral Daily   cycloSPORINE  1 drop Both Eyes BID   DULoxetine  60 mg Oral Daily   hydrALAZINE  100 mg Oral TID   levETIRAcetam  500 mg Oral BID   lipase/protease/amylase  36,000 Units Oral TID AC   methocarbamol  500 mg Oral TID   pantoprazole  40 mg Oral Daily   vitamin B-12  1,000 mcg Oral Daily   Continuous Infusions:  sodium chloride 10 mL/hr at 07/07/21 2008   anidulafungin     ciprofloxacin Stopped (07/11/21 0744)   dextrose 5 % and 0.9 % NaCl with KCl 20 mEq/L 100 mL/hr at 07/11/21 1326   PRN Meds:.diphenhydrAMINE, famotidine, hydrALAZINE, HYDROmorphone (  DILAUDID) injection, naphazoline-glycerin, ondansetron (ZOFRAN) IV, sodium chloride flush  Antimicrobials: Anti-infectives (From admission, onward)    Start     Dose/Rate Route Frequency Ordered Stop   07/11/21 1800  anidulafungin (ERAXIS) 100 mg in sodium chloride 0.9 % 100 mL IVPB        100 mg 78 mL/hr over 100 Minutes Intravenous Every 24 hours 07/10/21 1627     07/10/21 1800  ciprofloxacin (CIPRO) IVPB 400 mg        400 mg 200 mL/hr over 60 Minutes Intravenous Every 12 hours 07/10/21 1519     07/10/21 1715  anidulafungin (ERAXIS) 200 mg in sodium chloride 0.9 % 200 mL IVPB        200 mg 78 mL/hr over 200 Minutes Intravenous  Once 07/10/21 1627 07/10/21 2337   07/10/21 1600  vancomycin (VANCOREADY) IVPB 750 mg/150 mL  Status:  Discontinued        750 mg 150 mL/hr over 60 Minutes Intravenous Every 24 hours 07/09/21 1741 07/10/21 1512   07/09/21 1615  vancomycin (VANCOREADY) IVPB 1250 mg/250 mL        1,250 mg 166.7 mL/hr over 90  Minutes Intravenous  Once 07/09/21 1528 07/09/21 1800   07/06/21 1800  vancomycin (VANCOREADY) IVPB 750 mg/150 mL  Status:  Discontinued        750 mg 150 mL/hr over 60 Minutes Intravenous Every 24 hours 07/05/21 1631 07/06/21 0842   07/06/21 1400  vancomycin (VANCOCIN) IVPB 1000 mg/200 mL premix  Status:  Discontinued        1,000 mg 200 mL/hr over 60 Minutes Intravenous Every 24 hours 07/06/21 0842 07/07/21 1310   07/05/21 1700  meropenem (MERREM) 1 g in sodium chloride 0.9 % 100 mL IVPB  Status:  Discontinued        1 g 200 mL/hr over 30 Minutes Intravenous Every 12 hours 07/05/21 1608 07/10/21 1519   07/05/21 1700  vancomycin (VANCOREADY) IVPB 1250 mg/250 mL        1,250 mg 166.7 mL/hr over 90 Minutes Intravenous  Once 07/05/21 1630 07/05/21 1942        I have personally reviewed the following labs and images: CBC: Recent Labs  Lab 07/05/21 0924 07/06/21 0646 07/07/21 0401 07/09/21 0358 07/10/21 0337 07/10/21 1334 07/11/21 0300  WBC 2.8* 2.3* 1.6* 2.4* 2.9*  --  2.3*  NEUTROABS 2.1  --   --   --  2.3  --   --   HGB 8.3* 8.9* 8.8* 7.8* 7.5*  --  6.5*  HCT 25.1* 26.9* 27.0* 24.3* 23.0*  --  19.8*  MCV 91.9 91.2 92.2 93.1 93.1  --  93.0  PLT 137* 124* 120* 81* 65* 51* 37*   BMP &GFR Recent Labs  Lab 07/07/21 0401 07/08/21 0500 07/09/21 0358 07/10/21 0337 07/11/21 0300 07/11/21 0933  NA 134* 134* 138 134* 138  --   K 3.3* 3.5 3.6 4.1 3.4*  --   CL 100 100 107 109 102  --   CO2 27 27 23  21* 19*  --   GLUCOSE 119* 123* 141* 148* 380*  --   BUN 32* 40* 41* 40* 30*  --   CREATININE 1.89* 1.86* 1.57* 1.65* 1.46*  --   CALCIUM 9.0 8.9 8.9 8.9 7.7*  --   MG 1.8 2.4 2.0 1.8 1.7  --   PHOS 4.5 5.1* 2.7 1.8* 21.6* 2.9   Estimated Creatinine Clearance: 39.2 mL/min (A) (by C-G formula based on SCr of 1.46 mg/dL (H)).  Liver & Pancreas: Recent Labs  Lab 07/06/21 0646 07/07/21 0401 07/08/21 0500 07/09/21 0358 07/10/21 0337 07/11/21 0300  AST 29 49* 52*  --  281*  141*  ALT 32 49* 61*  --  281* 215*  ALKPHOS 96 113 102  --  211* 177*  BILITOT 0.7 0.6 0.4  --  1.4* 1.9*  PROT 6.0* 5.6* 5.3*  --  5.2* 4.7*  ALBUMIN 2.7* 2.5* 2.3* 2.3* 2.2* 1.9*   Recent Labs  Lab 07/05/21 0924  LIPASE 38   No results for input(s): AMMONIA in the last 168 hours. Diabetic: No results for input(s): HGBA1C in the last 72 hours. No results for input(s): GLUCAP in the last 168 hours. Cardiac Enzymes: Recent Labs  Lab 07/08/21 1820  CKTOTAL 16*   No results for input(s): PROBNP in the last 8760 hours. Coagulation Profile: Recent Labs  Lab 07/10/21 1334  INR 1.1   Thyroid Function Tests: No results for input(s): TSH, T4TOTAL, FREET4, T3FREE, THYROIDAB in the last 72 hours.  Lipid Profile: Recent Labs    07/10/21 0337  TRIG 88   Anemia Panel: No results for input(s): VITAMINB12, FOLATE, FERRITIN, TIBC, IRON, RETICCTPCT in the last 72 hours. Urine analysis:    Component Value Date/Time   COLORURINE YELLOW 06/19/2021 2145   APPEARANCEUR CLEAR 06/19/2021 2145   LABSPEC 1.015 06/19/2021 2145   PHURINE 6.0 06/19/2021 2145   GLUCOSEU NEGATIVE 06/19/2021 2145   HGBUR NEGATIVE 06/19/2021 2145   BILIRUBINUR NEGATIVE 06/19/2021 2145   [place] NEGATIVE 06/19/2021 2145   PROTEINUR 100 (A) 06/19/2021 2145   NITRITE NEGATIVE 06/19/2021 2145   LEUKOCYTESUR NEGATIVE 06/19/2021 2145   Sepsis Labs: Invalid input(s): PROCALCITONIN, [place]  Microbiology: Recent Results (from the past 240 hour(s))  Blood culture (routine x 2)     Status: None   Collection Time: 07/05/21  8:45 AM   Specimen: BLOOD  Result Value Ref Range Status   Specimen Description   Final    BLOOD BLOOD RIGHT FOREARM Performed at [facility] [address] [place], [place] [address]    Special Requests   Final    BOTTLES DRAWN AEROBIC AND ANAEROBIC Blood Culture results may not be optimal due to an inadequate volume of blood received in culture  bottles Performed at [facility] [address], [place], [place] [address]    Culture   Final    NO GROWTH 5 DAYS Performed at [facility], [place] [address], [place], [place] [address]    Report Status 07/10/2021 FINAL  Final  Blood culture (routine x 2)     Status: None   Collection Time: 07/05/21  9:10 AM   Specimen: BLOOD LEFT HAND  Result Value Ref Range Status   Specimen Description   Final    BLOOD LEFT HAND Performed at [facility] [address] [place], [place], [place] [address]    Special Requests   Final    BOTTLES DRAWN AEROBIC AND ANAEROBIC Blood Culture adequate volume Performed at [facility] [address] [place], [place] [address]    Culture   Final    NO GROWTH 5 DAYS Performed at [facility], [place] [address], [place], [place] [address]    Report Status 07/10/2021 FINAL  Final  Resp Panel by RT-PCR (Flu A&B, Covid) Nasopharyngeal Swab     Status: None   Collection Time: 07/05/21  9:24 AM   Specimen: Nasopharyngeal Swab; Nasopharyngeal(NP) swabs in vial  transport medium  Result Value Ref Range Status   SARS Coronavirus 2 by RT PCR NEGATIVE NEGATIVE Final    Comment: (NOTE) SARS-CoV-2 target nucleic acids are NOT DETECTED.  The SARS-CoV-2 RNA is generally detectable in upper respiratory specimens during the acute phase of infection. The lowest concentration of SARS-CoV-2 viral copies this assay can detect is 138 copies/mL. A negative result does not preclude SARS-Cov-2 infection and should not be used as the sole basis for treatment or other patient management decisions. A negative result may occur with  improper specimen collection/handling, submission of specimen other than nasopharyngeal swab, presence of viral mutation(s) within the areas targeted by this assay, and inadequate number of viral copies(<138 copies/mL). A negative result must be combined with clinical observations, patient  history, and epidemiological information. The expected result is Negative.  Fact Sheet for Patients:  EntrepreneurPulse.com.au  Fact Sheet for Healthcare Providers:  IncredibleEmployment.be  This test is no t yet approved or cleared by the [place] FDA and  has been authorized for detection and/or diagnosis of SARS-CoV-2 by FDA under an Emergency Use Authorization (EUA). This EUA will remain  in effect (meaning this test can be used) for the duration of the COVID-19 declaration under Section 564(b)(1) of the Act, 21 U.S.C.section 360bbb-3(b)(1), unless the authorization is terminated  or revoked sooner.       Influenza A by PCR NEGATIVE NEGATIVE Final   Influenza B by PCR NEGATIVE NEGATIVE Final    Comment: (NOTE) The Xpert Xpress SARS-CoV-2/FLU/RSV plus assay is intended as an aid in the diagnosis of influenza from Nasopharyngeal swab specimens and should not be used as a sole basis for treatment. Nasal washings and aspirates are unacceptable for Xpert Xpress SARS-CoV-2/FLU/RSV testing.  Fact Sheet for Patients: EntrepreneurPulse.com.au  Fact Sheet for Healthcare Providers: IncredibleEmployment.be  This test is not yet approved or cleared by the [place] FDA and has been authorized for detection and/or diagnosis of SARS-CoV-2 by FDA under an Emergency Use Authorization (EUA). This EUA will remain in effect (meaning this test can be used) for the duration of the COVID-19 declaration under Section 564(b)(1) of the Act, 21 U.S.C. section 360bbb-3(b)(1), unless the authorization is terminated or revoked.  Performed at [facility], [place] [address] [name], [place] [address]   Culture, blood (routine x 2)     Status: None (Preliminary result)   Collection Time: 07/09/21  8:17 AM   Specimen: BLOOD  Result Value Ref Range Status   Specimen Description   Final    BLOOD BLOOD  RIGHT HAND Performed at [facility] [address] [place], [place] [address]    Special Requests   Final    BOTTLES DRAWN AEROBIC ONLY Blood Culture results may not be optimal due to an inadequate volume of blood received in culture bottles Performed at [facility] [address], [place], [place] [address]    Culture  Setup Time   Final    AEROBIC BOTTLE ONLY YEAST CRITICAL VALUE NOTED.  VALUE IS CONSISTENT WITH PREVIOUSLY REPORTED AND CALLED VALUE. Performed at [facility], [place] [address] [place], [place] [address]    Culture YEAST  Final   Report Status PENDING  Incomplete  Culture, blood (routine x 2)     Status: Abnormal (Preliminary result)   Collection Time: 07/09/21  8:17 AM   Specimen: BLOOD  Result Value Ref Range Status   Specimen Description   Final    BLOOD RIGHT  ANTECUBITAL Performed at [facility], [place] [address], [place], [place] [address]    Special Requests   Final    BOTTLES DRAWN AEROBIC ONLY Blood Culture results may not be optimal due to an inadequate volume of blood received in culture bottles Performed at [facility] [address] [place], [place] [address]    Culture  Setup Time (A)  Final    YEAST AEROBIC BOTTLE ONLY CRITICAL RESULT CALLED TO, READ BACK BY AND VERIFIED WITH: Vena Austria D.WOFFORD ON [id number] AT 1740 BY E.PARRISH Performed at [facility], [place] [address] [place], [place] [address]    Culture CANDIDA PARAPSILOSIS (A)  Final   Report Status PENDING  Incomplete  Blood Culture ID Panel (Reflexed)     Status: Abnormal   Collection Time: 07/09/21  8:17 AM  Result Value Ref Range Status   Enterococcus faecalis NOT DETECTED NOT DETECTED Final   Enterococcus Faecium NOT DETECTED NOT DETECTED Final   Listeria monocytogenes NOT DETECTED NOT DETECTED Final   Staphylococcus species NOT DETECTED NOT DETECTED Final   Staphylococcus aureus (BCID) NOT  DETECTED NOT DETECTED Final   Staphylococcus epidermidis NOT DETECTED NOT DETECTED Final   Staphylococcus lugdunensis NOT DETECTED NOT DETECTED Final   Streptococcus species NOT DETECTED NOT DETECTED Final   Streptococcus agalactiae NOT DETECTED NOT DETECTED Final   Streptococcus pneumoniae NOT DETECTED NOT DETECTED Final   Streptococcus pyogenes NOT DETECTED NOT DETECTED Final   A.calcoaceticus-baumannii NOT DETECTED NOT DETECTED Final   Bacteroides fragilis NOT DETECTED NOT DETECTED Final   Enterobacterales NOT DETECTED NOT DETECTED Final   Enterobacter cloacae complex NOT DETECTED NOT DETECTED Final   Escherichia coli NOT DETECTED NOT DETECTED Final   Klebsiella aerogenes NOT DETECTED NOT DETECTED Final   Klebsiella oxytoca NOT DETECTED NOT DETECTED Final   Klebsiella pneumoniae NOT DETECTED NOT DETECTED Final   Proteus species NOT DETECTED NOT DETECTED Final   Salmonella species NOT DETECTED NOT DETECTED Final   Serratia marcescens NOT DETECTED NOT DETECTED Final   Haemophilus influenzae NOT DETECTED NOT DETECTED Final   Neisseria meningitidis NOT DETECTED NOT DETECTED Final   Pseudomonas aeruginosa NOT DETECTED NOT DETECTED Final   Stenotrophomonas maltophilia NOT DETECTED NOT DETECTED Final   Candida albicans NOT DETECTED NOT DETECTED Final   Candida auris NOT DETECTED NOT DETECTED Final   Candida glabrata NOT DETECTED NOT DETECTED Final   Candida krusei NOT DETECTED NOT DETECTED Final   Candida parapsilosis DETECTED (A) NOT DETECTED Final    Comment: CRITICAL RESULT CALLED TO, READ BACK BY AND VERIFIED WITH: PHARM D D.WOFFORD ON [id number] AT 1740 BY E.PARRISH    Candida tropicalis NOT DETECTED NOT DETECTED Final   Cryptococcus neoformans/gattii NOT DETECTED NOT DETECTED Final    Comment: Performed at [facility], [place] [address], [place], [place] [address]    Radiology Studies: IR Removal Tun Cv Cath W/O FL  Result Date: 07/11/2021 INDICATION: Patient with history of  short gut syndrome secondary to Crohn's disease, poor venous access with prior right common femoral vein tunneled central catheter placement; now with fungemia and persistent fevers; request received for catheter removal EXAM: REMOVAL TUNNELED CENTRAL VENOUS CATHETER MEDICATIONS: None ANESTHESIA/SEDATION: None FLUOROSCOPY TIME:  None COMPLICATIONS: None immediate. PROCEDURE: Informed consent was obtained from the patient after a thorough discussion of the procedural risks, benefits and alternatives. All questions were addressed. Maximal Sterile Barrier Technique was utilized including caps, mask, sterile gowns, sterile gloves, sterile drape, hand hygiene  and skin antiseptic. A timeout was performed prior to the initiation of the procedure. [name] right upper thigh region and catheter was prepped and draped in a normal sterile fashion. Sutures were removed and using gentle manual traction the cuff of the catheter was exposed and the catheter was removed in it's entirety. Pressure was held till hemostasis was obtained. A sterile dressing was applied. The patient tolerated the procedure well with no immediate complications. IMPRESSION: Successful catheter removal as described above. Read by: Rowe Robert, PA-C Electronically Signed   By: Markus Daft M.D.   On: 07/11/2021 10:03   VAS [place] LOWER EXTREMITY VENOUS (DVT)  Result Date: 07/11/2021  Lower Venous DVT Study Patient Name:  Caitlin Peters  Date of Exam:   07/11/2021 Medical Rec #: [id number]         Accession #:    [id number] Date of Birth: 1960-12-27         Patient Gender: F Patient Age:   55 years Exam Location:  [facility] Procedure:      VAS [place] LOWER EXTREMITY VENOUS (DVT) Referring Phys: Bretta Bang Rylee Nuzum --------------------------------------------------------------------------------  Indications: Swelling.  Risk Factors: PICC placement RLE. Comparison Study: Previous exam 05/07/21 - negative Performing Technologist: Rogelia Rohrer RVT, RDMS   Examination Guidelines: A complete evaluation includes B-mode imaging, spectral Doppler, color Doppler, and power Doppler as needed of all accessible portions of each vessel. Bilateral testing is considered an integral part of a complete examination. Limited examinations for reoccurring indications may be performed as noted. The reflux portion of the exam is performed with the patient in reverse Trendelenburg.  +---------+---------------+---------+-----------+----------+--------------+ RIGHT    CompressibilityPhasicitySpontaneityPropertiesThrombus Aging +---------+---------------+---------+-----------+----------+--------------+ CFV      Full           Yes      Yes                                 +---------+---------------+---------+-----------+----------+--------------+ SFJ      Full                                                        +---------+---------------+---------+-----------+----------+--------------+ FV Prox  Full           Yes      Yes                                 +---------+---------------+---------+-----------+----------+--------------+ FV Mid   Full           Yes      Yes                                 +---------+---------------+---------+-----------+----------+--------------+ FV DistalFull           Yes      Yes                                 +---------+---------------+---------+-----------+----------+--------------+ PFV      Full                                                        +---------+---------------+---------+-----------+----------+--------------+  POP      Full           Yes      Yes                                 +---------+---------------+---------+-----------+----------+--------------+ PTV      Full                                                        +---------+---------------+---------+-----------+----------+--------------+ PERO     Full                                                         +---------+---------------+---------+-----------+----------+--------------+   +---------+---------------+---------+-----------+----------+--------------+ LEFT     CompressibilityPhasicitySpontaneityPropertiesThrombus Aging +---------+---------------+---------+-----------+----------+--------------+ CFV      Full           Yes      Yes                                 +---------+---------------+---------+-----------+----------+--------------+ SFJ      Full                                                        +---------+---------------+---------+-----------+----------+--------------+ FV Prox  Full           Yes      Yes                                 +---------+---------------+---------+-----------+----------+--------------+ FV Mid   Full           Yes      Yes                                 +---------+---------------+---------+-----------+----------+--------------+ FV DistalFull           Yes      Yes                                 +---------+---------------+---------+-----------+----------+--------------+ PFV      Full                                                        +---------+---------------+---------+-----------+----------+--------------+ POP      Full           Yes      Yes                                 +---------+---------------+---------+-----------+----------+--------------+ PTV  Full                                                        +---------+---------------+---------+-----------+----------+--------------+ PERO     Full                                                        +---------+---------------+---------+-----------+----------+--------------+     Summary: BILATERAL: - No evidence of deep vein thrombosis seen in the lower extremities, bilaterally. - No evidence of superficial venous thrombosis in the lower extremities, bilaterally. -No evidence of popliteal cyst, bilaterally.   *See table(s) above for measurements  and observations.    Preliminary    US ABDOMEN LIMITED RUQ (LIVER/GB)  Result Date: 07/10/2021 CLINICAL DATA:  Elevated liver enzyme EXAM: ULTRASOUND ABDOMEN LIMITED RIGHT UPPER QUADRANT COMPARISON:  CT 07/05/2021, ultrasound 05/10/2021 FINDINGS: Gallbladder: Surgically absent Common bile duct: Diameter: 6.6 mm Liver: No focal lesion identified. Within normal limits in parenchymal echogenicity. Portal vein is patent on color Doppler imaging with normal direction of blood flow towards the liver. Other: Right kidney appears echogenic. Small amount of right upper quadrant ascites IMPRESSION: 1. Status post cholecystectomy. 2. Trace ascites 3. Right kidney appears echogenic, correlate with appropriate laboratory values Electronically Signed   By: Donavan Foil M.D.   On: 07/10/2021 19:34      Caitlin Peters  If 7PM-7AM, please contact night-coverage [email or web address] 07/11/2021, 2:36 PM

## 2021-07-12 ENCOUNTER — Other Ambulatory Visit (HOSPITAL_COMMUNITY): Payer: Medicare Other

## 2021-07-12 ENCOUNTER — Inpatient Hospital Stay (HOSPITAL_COMMUNITY): Payer: Medicare Other

## 2021-07-12 DIAGNOSIS — I38 Endocarditis, valve unspecified: Secondary | ICD-10-CM | POA: Diagnosis not present

## 2021-07-12 DIAGNOSIS — T80211A Bloodstream infection due to central venous catheter, initial encounter: Secondary | ICD-10-CM | POA: Diagnosis not present

## 2021-07-12 DIAGNOSIS — A419 Sepsis, unspecified organism: Secondary | ICD-10-CM | POA: Diagnosis not present

## 2021-07-12 DIAGNOSIS — K7200 Acute and subacute hepatic failure without coma: Secondary | ICD-10-CM | POA: Diagnosis not present

## 2021-07-12 DIAGNOSIS — R6520 Severe sepsis without septic shock: Secondary | ICD-10-CM | POA: Diagnosis not present

## 2021-07-12 LAB — CBC
HCT: 25.1 % — ABNORMAL LOW (ref 36.0–46.0)
Hemoglobin: 8.1 g/dL — ABNORMAL LOW (ref 12.0–15.0)
MCH: 28.5 pg (ref 26.0–34.0)
MCHC: 32.3 g/dL (ref 30.0–36.0)
MCV: 88.4 fL (ref 80.0–100.0)
Platelets: 77 10*3/uL — ABNORMAL LOW (ref 150–400)
RBC: 2.84 MIL/uL — ABNORMAL LOW (ref 3.87–5.11)
RDW: 18.4 % — ABNORMAL HIGH (ref 11.5–15.5)
WBC: 3.5 10*3/uL — ABNORMAL LOW (ref 4.0–10.5)
nRBC: 0 % (ref 0.0–0.2)

## 2021-07-12 LAB — TYPE AND SCREEN
ABO/RH(D): O POS
Antibody Screen: NEGATIVE
Unit division: 0

## 2021-07-12 LAB — BPAM RBC
Blood Product Expiration Date: 202209162359
ISSUE DATE / TIME: 202208161017
Unit Type and Rh: 5100

## 2021-07-12 LAB — COMPREHENSIVE METABOLIC PANEL
ALT: 175 U/L — ABNORMAL HIGH (ref 0–44)
AST: 72 U/L — ABNORMAL HIGH (ref 15–41)
Albumin: 2 g/dL — ABNORMAL LOW (ref 3.5–5.0)
Alkaline Phosphatase: 190 U/L — ABNORMAL HIGH (ref 38–126)
Anion gap: 6 (ref 5–15)
BUN: 31 mg/dL — ABNORMAL HIGH (ref 6–20)
CO2: 20 mmol/L — ABNORMAL LOW (ref 22–32)
Calcium: 9.2 mg/dL (ref 8.9–10.3)
Chloride: 110 mmol/L (ref 98–111)
Creatinine, Ser: 1.64 mg/dL — ABNORMAL HIGH (ref 0.44–1.00)
GFR, Estimated: 36 mL/min — ABNORMAL LOW (ref >60)
Glucose, Bld: 116 mg/dL — ABNORMAL HIGH (ref 70–99)
Potassium: 4.4 mmol/L (ref 3.5–5.1)
Sodium: 136 mmol/L (ref 135–145)
Total Bilirubin: 1.3 mg/dL — ABNORMAL HIGH (ref 0.3–1.2)
Total Protein: 5.1 g/dL — ABNORMAL LOW (ref 6.5–8.1)

## 2021-07-12 LAB — MAGNESIUM: Magnesium: 2.4 mg/dL (ref 1.7–2.4)

## 2021-07-12 LAB — ECHOCARDIOGRAM COMPLETE
Area-P 1/2: 3.08 cm2
Calc EF: 62.8 %
Height: 68 in
S' Lateral: 2.7 cm
Single Plane A2C EF: 55.8 %
Single Plane A4C EF: 68.7 %
Weight: 2264.57 oz

## 2021-07-12 LAB — AMMONIA: Ammonia: 25 umol/L (ref 9–35)

## 2021-07-12 LAB — PHOSPHORUS: Phosphorus: 3.1 mg/dL (ref 2.5–4.6)

## 2021-07-12 MED ORDER — HYDROMORPHONE HCL 1 MG/ML IJ SOLN
1.0000 mg | Freq: Once | INTRAMUSCULAR | Status: AC
Start: 2021-07-12 — End: 2021-07-12
  Administered 2021-07-12: 1 mg via INTRAVENOUS
  Filled 2021-07-12: qty 1

## 2021-07-12 MED ORDER — METHADONE HCL 10 MG PO TABS
5.0000 mg | ORAL_TABLET | Freq: Every day | ORAL | Status: DC
Start: 2021-07-12 — End: 2021-07-15
  Administered 2021-07-12 – 2021-07-14 (×13): 5 mg via ORAL
  Filled 2021-07-12 (×14): qty 1

## 2021-07-12 MED ORDER — TEDUGLUTIDE (RDNA) 5 MG ~~LOC~~ KIT
1.5000 mg | PACK | Freq: Every day | SUBCUTANEOUS | Status: DC
  Administered 2021-07-12 – 2021-07-14 (×3): 1.5 mg via SUBCUTANEOUS

## 2021-07-12 NOTE — Progress Notes (Signed)
  Echocardiogram 2D Echocardiogram has been performed.  Michiel Cowboy 07/12/2021, 9:43 AM

## 2021-07-12 NOTE — Progress Notes (Signed)
MD notified of bladder scan reading 440. Instructed to I&O cathx1. 600 ccs of clear yellow urine retrieved.

## 2021-07-12 NOTE — Hospital Course (Addendum)
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 5643 complicated by frequent PICC line infections previous T2-T5 discitis discharged on [facility] hosp 7/25-06/30/2021 Rx actinobacter bacteremia to be treated through 07/09/2021-chronic lumbar PICC line Chronic severe malnutrition BMI 21 CKD 3 Anemia of chronic disease  Admit 07/02/2021 signs of sepsis-ID consulted patient started on Vanco meropenem  Eventually found to be growing yeast in blood cultures 8/14  BUNs/creatinine 30/1.4-->31/1.6 mg bicarb 20 Alk phos 190 AST/ALT 72/1 7 Hemoglobin 8.1 WBC 2.3->3.5  platelet 37->77  Repeat blood culture 8/16 Echocardiogram pending Needs outpatient ophthalmology follow-up

## 2021-07-12 NOTE — Progress Notes (Addendum)
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 5621 complicated by frequent PICC line infections previous T2-T5 discitis discharged on D[facility] hosp 7/25-06/30/2021 Rx actinobacter bacteremia to be treated through 07/09/2021-chronic lumbar PICC line Chronic severe malnutrition BMI 21 CKD 3 Anemia of chronic disease  Admit 07/02/2021 signs of sepsis-ID consulted patient started on Vanco meropenem  Eventually found to be growing yeast in blood cultures 8/14  Hospital-Problem based course  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 Line holiday in process, antibiotics narrowed to Cipro until 8/23 per ID Continue Eraxis as per ID instructions Blood culture 8/16 repeat is pending TTE ordered to rule out valve vegetation Outpatient ophthalmology follow-up, lumbar imaging as per ID D5/NS 20 of K at 100 cc/h for now Short gut syndrome 2/2 prior multiple surgeries for Crohn's TPN on hold at this time Await cultures to determine when can replace Resumed Gattex at patient request Continue Protonix 40 daily, Creon 36,000 3 times daily, Pepcid 20 as needed Carafate twice daily on hold Continue Entocort 3 mg every 3 days Chronic pain Continue Robaxin 500 3 times daily Now off of Dilaudid to home medication methadone in the outpatient setting 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 1 unit PRBC 8/16-good response Get iron studies Hypokalemia/hypomagnesemia Resolving with replacement TPN related cholestasis Moderate to severe thrombocytopenia Follow as outpatient TCP can be explained by infection--this seems to be resolving Patient is also on estradiol oral tablets which have been held May require outpatient ultrasound liver-patient has had TPN for many years Elevated  D-dimer-lower extremity duplex ruled out DVT Monitor trends Hypotension with a history of hypertension Meds were adjusted-continue amlodipine 10 Coreg 6.25 twice daily, watch hydralazine 100 3 times daily Depression Outpatient follow-up-continue Cymbalta 60 daily, Wellbutrin 100 twice daily Seizure disorder Continue Keppra 500 twice daily   DVT prophylaxis: TED hose Code Status: Full Family Communication: None present Disposition:  Status is: Inpatient  Remains inpatient appropriate because:Hemodynamically unstable, Altered mental status, Ongoing diagnostic testing needed not appropriate for outpatient work up, and Unsafe d/c plan  Dispo: The patient is from: Home              Anticipated d/c is to: Home              Patient currently is not medically stable to d/c.   Difficult to place patient No       Consultants:  Infectious disease Dr. Gale Journey  Procedures: None yet  Antimicrobials:  Eraxis 8/15 Cefepime 8/10  Subjective: Doing well Sitting up Has some mild abdominal discomfort and bloating No chills no rigors-did not order breakfast yet No chest pain No cough no cold No overt bleeding  Objective: Vitals:   07/12/21 0700 07/12/21 0800 07/12/21 0801 07/12/21 0819  BP: (!) 141/66   (!) 144/64  Pulse: 61     Resp: 16     Temp:  97.6 F (36.4 C) (!) 97.3 F (36.3 C)   TempSrc:  Oral Axillary   SpO2: 97%     Weight:      Height:        Intake/Output Summary (Last 24 hours) at 07/12/2021 0854 Last data filed at 07/12/2021 0700 Gross per 24 hour  Intake 1980.99 ml  Output 600 ml  Net 1380.99 ml   Filed Weights   07/09/21 0607 07/10/21 0500  07/12/21 0500  Weight: 59.2 kg 60.6 kg 64.2 kg    Examination:  EOMI NCAT freckling to front of chest with mildly elevated JVD S1-S2 no murmur no rub or gallop Abdomen soft slightly distended but no rebound or guarding ROM intact No lower extremity edema Chest is clear  Data Reviewed: personally reviewed    CBC    Component Value Date/Time   WBC 3.5 (L) 07/12/2021 0606   RBC 2.84 (L) 07/12/2021 0606   HGB 8.1 (L) 07/12/2021 0606   HCT 25.1 (L) 07/12/2021 0606   PLT 77 (L) 07/12/2021 0606   MCV 88.4 07/12/2021 0606   MCH 28.5 07/12/2021 0606   MCHC 32.3 07/12/2021 0606   RDW 18.4 (H) 07/12/2021 0606   LYMPHSABS 0.3 (L) 07/10/2021 0337   MONOABS 0.2 07/10/2021 0337   EOSABS 0.0 07/10/2021 0337   BASOSABS 0.0 07/10/2021 0337   CMP Latest Ref Rng & Units 07/12/2021 07/11/2021 07/10/2021  Glucose 70 - 99 mg/dL 116(H) 380(H) 148(H)  BUN 6 - 20 mg/dL 31(H) 30(H) 40(H)  Creatinine 0.44 - 1.00 mg/dL 1.64(H) 1.46(H) 1.65(H)  Sodium 135 - 145 mmol/L 136 138 134(L)  Potassium 3.5 - 5.1 mmol/L 4.4 3.4(L) 4.1  Chloride 98 - 111 mmol/L 110 102 109  CO2 22 - 32 mmol/L 20(L) 19(L) 21(L)  Calcium 8.9 - 10.3 mg/dL 9.2 7.7(L) 8.9  Total Protein 6.5 - 8.1 g/dL 5.1(L) 4.7(L) 5.2(L)  Total Bilirubin 0.3 - 1.2 mg/dL 1.3(H) 1.9(H) 1.4(H)  Alkaline Phos 38 - 126 U/L 190(H) 177(H) 211(H)  AST 15 - 41 U/L 72(H) 141(H) 281(H)  ALT 0 - 44 U/L 175(H) 215(H) 281(H)     Radiology Studies: IR Removal Tun Cv Cath W/O FL  Result Date: 07/11/2021 INDICATION: Patient with history of short gut syndrome secondary to Crohn's disease, poor venous access with prior right common femoral vein tunneled central catheter placement; now with fungemia and persistent fevers; request received for catheter removal EXAM: REMOVAL TUNNELED CENTRAL VENOUS CATHETER MEDICATIONS: None ANESTHESIA/SEDATION: None FLUOROSCOPY TIME:  None COMPLICATIONS: None immediate. PROCEDURE: Informed consent was obtained from the patient after a thorough discussion of the procedural risks, benefits and alternatives. All questions were addressed. Maximal Sterile Barrier Technique was utilized including caps, mask, sterile gowns, sterile gloves, sterile drape, hand hygiene and skin antiseptic. A timeout was performed prior to the initiation of the procedure.  [name] right upper thigh region and catheter was prepped and draped in a normal sterile fashion. Sutures were removed and using gentle manual traction the cuff of the catheter was exposed and the catheter was removed in it's entirety. Pressure was held till hemostasis was obtained. A sterile dressing was applied. The patient tolerated the procedure well with no immediate complications. IMPRESSION: Successful catheter removal as described above. Read by: Rowe Robert, PA-C Electronically Signed   By: Markus Daft M.D.   On: 07/11/2021 10:03   VAS [place] LOWER EXTREMITY VENOUS (DVT)  Result Date: 07/11/2021  Lower Venous DVT Study Patient Name:  Caitlin Peters  Date of Exam:   07/11/2021 Medical Rec #: [id number]         Accession #:    [id number] Date of Birth: 12-11-1960         Patient Gender: F Patient Age:   60 years Exam Location:  [facility] Procedure:      VAS [place] LOWER EXTREMITY VENOUS (DVT) Referring Phys: Bretta Bang GONFA --------------------------------------------------------------------------------  Indications: Swelling.  Risk Factors: PICC placement RLE. Comparison Study: Previous exam  05/07/21 - negative Performing Technologist: Rogelia Rohrer RVT, RDMS  Examination Guidelines: A complete evaluation includes B-mode imaging, spectral Doppler, color Doppler, and power Doppler as needed of all accessible portions of each vessel. Bilateral testing is considered an integral part of a complete examination. Limited examinations for reoccurring indications may be performed as noted. The reflux portion of the exam is performed with the patient in reverse Trendelenburg.  +---------+---------------+---------+-----------+----------+--------------+ RIGHT    CompressibilityPhasicitySpontaneityPropertiesThrombus Aging +---------+---------------+---------+-----------+----------+--------------+ CFV      Full           Yes      Yes                                  +---------+---------------+---------+-----------+----------+--------------+ SFJ      Full                                                        +---------+---------------+---------+-----------+----------+--------------+ FV Prox  Full           Yes      Yes                                 +---------+---------------+---------+-----------+----------+--------------+ FV Mid   Full           Yes      Yes                                 +---------+---------------+---------+-----------+----------+--------------+ FV DistalFull           Yes      Yes                                 +---------+---------------+---------+-----------+----------+--------------+ PFV      Full                                                        +---------+---------------+---------+-----------+----------+--------------+ POP      Full           Yes      Yes                                 +---------+---------------+---------+-----------+----------+--------------+ PTV      Full                                                        +---------+---------------+---------+-----------+----------+--------------+ PERO     Full                                                        +---------+---------------+---------+-----------+----------+--------------+   +---------+---------------+---------+-----------+----------+--------------+  LEFT     CompressibilityPhasicitySpontaneityPropertiesThrombus Aging +---------+---------------+---------+-----------+----------+--------------+ CFV      Full           Yes      Yes                                 +---------+---------------+---------+-----------+----------+--------------+ SFJ      Full                                                        +---------+---------------+---------+-----------+----------+--------------+ FV Prox  Full           Yes      Yes                                  +---------+---------------+---------+-----------+----------+--------------+ FV Mid   Full           Yes      Yes                                 +---------+---------------+---------+-----------+----------+--------------+ FV DistalFull           Yes      Yes                                 +---------+---------------+---------+-----------+----------+--------------+ PFV      Full                                                        +---------+---------------+---------+-----------+----------+--------------+ POP      Full           Yes      Yes                                 +---------+---------------+---------+-----------+----------+--------------+ PTV      Full                                                        +---------+---------------+---------+-----------+----------+--------------+ PERO     Full                                                        +---------+---------------+---------+-----------+----------+--------------+     Summary: BILATERAL: - No evidence of deep vein thrombosis seen in the lower extremities, bilaterally. - No evidence of superficial venous thrombosis in the lower extremities, bilaterally. -No evidence of popliteal cyst, bilaterally.   *See table(s) above for measurements and observations. Electronically signed by Deitra Mayo MD on 07/11/2021 at 3:28:54 PM.    Final    US  ABDOMEN LIMITED RUQ (LIVER/GB)  Result Date: 07/10/2021 CLINICAL DATA:  Elevated liver enzyme EXAM: ULTRASOUND ABDOMEN LIMITED RIGHT UPPER QUADRANT COMPARISON:  CT 07/05/2021, ultrasound 05/10/2021 FINDINGS: Gallbladder: Surgically absent Common bile duct: Diameter: 6.6 mm Liver: No focal lesion identified. Within normal limits in parenchymal echogenicity. Portal vein is patent on color Doppler imaging with normal direction of blood flow towards the liver. Other: Right kidney appears echogenic. Small amount of right upper quadrant ascites IMPRESSION: 1. Status post  cholecystectomy. 2. Trace ascites 3. Right kidney appears echogenic, correlate with appropriate laboratory values Electronically Signed   By: Donavan Foil M.D.   On: 07/10/2021 19:34     Scheduled Meds:  (feeding supplement) PROSource Plus  30 mL Oral BID BM   amLODipine  10 mg Oral Daily   budesonide  3 mg Oral Q3 days   carvedilol  6.25 mg Oral BID WC   Chlorhexidine Gluconate Cloth  6 each Topical Daily   cholecalciferol  1,000 Units Oral Daily   cycloSPORINE  1 drop Both Eyes BID   DULoxetine  60 mg Oral Daily   hydrALAZINE  100 mg Oral TID   levETIRAcetam  500 mg Oral BID   lipase/protease/amylase  36,000 Units Oral TID AC   methocarbamol  500 mg Oral TID   pantoprazole  40 mg Oral Daily   Teduglutide (rDNA)  1.5 mg Subcutaneous Daily   vitamin B-12  1,000 mcg Oral Daily   Continuous Infusions:  sodium chloride 10 mL/hr at 07/07/21 2008   anidulafungin Stopped (07/11/21 1911)   ciprofloxacin Stopped (07/12/21 0651)   dextrose 5 % and 0.9 % NaCl with KCl 20 mEq/L 100 mL/hr at 07/12/21 0700     LOS: 7 days   Time spent: [place], MD [facility] To contact the attending provider between 7A-7P or the covering provider during after hours 7P-7A, please log into the web site [email or web address] and access using universal [place] password for that web site. If you do not have the password, please call the hospital operator.  07/12/2021, 8:54 AM

## 2021-07-13 ENCOUNTER — Inpatient Hospital Stay (HOSPITAL_COMMUNITY): Payer: Medicare Other

## 2021-07-13 DIAGNOSIS — A419 Sepsis, unspecified organism: Secondary | ICD-10-CM | POA: Diagnosis not present

## 2021-07-13 DIAGNOSIS — T827XXD Infection and inflammatory reaction due to other cardiac and vascular devices, implants and grafts, subsequent encounter: Secondary | ICD-10-CM | POA: Diagnosis not present

## 2021-07-13 DIAGNOSIS — R6520 Severe sepsis without septic shock: Secondary | ICD-10-CM | POA: Diagnosis not present

## 2021-07-13 DIAGNOSIS — B377 Candidal sepsis: Secondary | ICD-10-CM | POA: Diagnosis not present

## 2021-07-13 DIAGNOSIS — T80211A Bloodstream infection due to central venous catheter, initial encounter: Secondary | ICD-10-CM | POA: Diagnosis not present

## 2021-07-13 DIAGNOSIS — M4620 Osteomyelitis of vertebra, site unspecified: Secondary | ICD-10-CM | POA: Diagnosis not present

## 2021-07-13 DIAGNOSIS — K7200 Acute and subacute hepatic failure without coma: Secondary | ICD-10-CM | POA: Diagnosis not present

## 2021-07-13 LAB — COMPREHENSIVE METABOLIC PANEL
ALT: 147 U/L — ABNORMAL HIGH (ref 0–44)
AST: 53 U/L — ABNORMAL HIGH (ref 15–41)
Albumin: 1.9 g/dL — ABNORMAL LOW (ref 3.5–5.0)
Alkaline Phosphatase: 212 U/L — ABNORMAL HIGH (ref 38–126)
Anion gap: 5 (ref 5–15)
BUN: 30 mg/dL — ABNORMAL HIGH (ref 6–20)
CO2: 18 mmol/L — ABNORMAL LOW (ref 22–32)
Calcium: 9.4 mg/dL (ref 8.9–10.3)
Chloride: 112 mmol/L — ABNORMAL HIGH (ref 98–111)
Creatinine, Ser: 1.6 mg/dL — ABNORMAL HIGH (ref 0.44–1.00)
GFR, Estimated: 37 mL/min — ABNORMAL LOW (ref >60)
Glucose, Bld: 104 mg/dL — ABNORMAL HIGH (ref 70–99)
Potassium: 4.3 mmol/L (ref 3.5–5.1)
Sodium: 135 mmol/L (ref 135–145)
Total Bilirubin: 1.2 mg/dL (ref 0.3–1.2)
Total Protein: 5.2 g/dL — ABNORMAL LOW (ref 6.5–8.1)

## 2021-07-13 LAB — CBC WITH DIFFERENTIAL/PLATELET
Abs Immature Granulocytes: 0.02 10*3/uL (ref 0.00–0.07)
Basophils Absolute: 0 10*3/uL (ref 0.0–0.1)
Basophils Relative: 0 %
Eosinophils Absolute: 0 10*3/uL (ref 0.0–0.5)
Eosinophils Relative: 1 %
HCT: 26.1 % — ABNORMAL LOW (ref 36.0–46.0)
Hemoglobin: 8.6 g/dL — ABNORMAL LOW (ref 12.0–15.0)
Immature Granulocytes: 1 %
Lymphocytes Relative: 49 %
Lymphs Abs: 1.5 10*3/uL (ref 0.7–4.0)
MCH: 28.5 pg (ref 26.0–34.0)
MCHC: 33 g/dL (ref 30.0–36.0)
MCV: 86.4 fL (ref 80.0–100.0)
Monocytes Absolute: 0.3 10*3/uL (ref 0.1–1.0)
Monocytes Relative: 10 %
Neutro Abs: 1.2 10*3/uL — ABNORMAL LOW (ref 1.7–7.7)
Neutrophils Relative %: 39 %
Platelets: 93 10*3/uL — ABNORMAL LOW (ref 150–400)
RBC: 3.02 MIL/uL — ABNORMAL LOW (ref 3.87–5.11)
RDW: 18 % — ABNORMAL HIGH (ref 11.5–15.5)
WBC: 3 10*3/uL — ABNORMAL LOW (ref 4.0–10.5)
nRBC: 0 % (ref 0.0–0.2)

## 2021-07-13 LAB — PHOSPHORUS: Phosphorus: 3.9 mg/dL (ref 2.5–4.6)

## 2021-07-13 LAB — MAGNESIUM: Magnesium: 2 mg/dL (ref 1.7–2.4)

## 2021-07-13 LAB — MISC LABCORP TEST (SEND OUT)
LabCorp test name: 183119
Labcorp test code: 183119

## 2021-07-13 MED ORDER — FLUCONAZOLE IN SODIUM CHLORIDE 400-0.9 MG/200ML-% IV SOLN
400.0000 mg | Freq: Once | INTRAVENOUS | Status: AC
  Administered 2021-07-13: 400 mg via INTRAVENOUS
  Filled 2021-07-13: qty 200

## 2021-07-13 MED ORDER — KCL-LACTATED RINGERS-D5W 20 MEQ/L IV SOLN
INTRAVENOUS | Status: DC
  Filled 2021-07-13 (×3): qty 1000

## 2021-07-13 MED ORDER — FLUCONAZOLE IN SODIUM CHLORIDE 200-0.9 MG/100ML-% IV SOLN
200.0000 mg | INTRAVENOUS | Status: DC
Start: 2021-07-14 — End: 2021-07-15
  Administered 2021-07-14: 200 mg via INTRAVENOUS
  Filled 2021-07-13: qty 100

## 2021-07-13 NOTE — Progress Notes (Signed)
[facility] for Infectious Disease  Date of Admission:  07/05/2021           Reason for visit: Follow up on bacteremia/candidaemia  Current antibiotics: Cipro 8/15-c Eraxis 8/15-c  Meropenem 8/10-15 Vanc 8/10-8/15  Previous antibiotics: Cipro PO PTA  ASSESSMENT:    Recurrent CLABSI; s/p right groin catheter removal 8/16 Candidaemia. 8/17 limited tee negative for endocarditis History of Acinetobacter bloodstream infection secondary to line infection.  Right femoral line last exchanged May 2022; removed again in setting fungemia on 8/16. 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 Fever ongoing Lumbar midback tenderness - hx discitis as mentioned above Thrombocytopenia/lft elevation - improved lft but platelet still down trending --> sepsis (schistocytes seen) vs medication associated  PLAN:    Finish 7 more days of iv cipro until 8/23 (has had 2 weeks abx but line removed so will do 7 days from line removal) Can transition eraxis to iv fluconazole as well. Plan 2 weeks from 8/17 till 8/31 Need outpatient f/u ophthalmology to evaluate for candida uveitis where longer course of treatment will be required Reasonable to get an mri lumbar spine given degree of pain and ongoing bacteremia/fungemia Would defer placing central line till tomorrow Discuss with primary team   I spent more than 35 minute reviewing data/chart, and coordinating care and >50% direct face to face time providing counseling/discussing diagnostics/treatment plan with patient   Principal Problem:   Sepsis ([place]) Active Problems:   Crohn's disease of both small and large intestine with other complication ([place])   Short gut syndrome   On total parenteral nutrition (TPN)   Central line infection   Bacteremia associated with intravascular line ([place])    Candidemia ([place])   CKD (chronic kidney disease) stage 3, GFR 30-59 ml/min ([name])   Malnutrition of moderate degree   Thrombocytopenia ([name])   Bloodstream infection due to central venous catheter    MEDICATIONS:    Scheduled Meds:  (feeding supplement) PROSource Plus  30 mL Oral BID BM   amLODipine  10 mg Oral Daily   budesonide  3 mg Oral Q3 days   carvedilol  6.25 mg Oral BID WC   Chlorhexidine Gluconate Cloth  6 each Topical Daily   cholecalciferol  1,000 Units Oral Daily   cycloSPORINE  1 drop Both Eyes BID   DULoxetine  60 mg Oral Daily   hydrALAZINE  100 mg Oral TID   levETIRAcetam  500 mg Oral BID   lipase/protease/amylase  36,000 Units Oral TID AC   methadone  5 mg Oral 5 X Daily   methocarbamol  500 mg Oral TID   pantoprazole  40 mg Oral Daily   Teduglutide (rDNA)  1.5 mg Subcutaneous Daily   vitamin B-12  1,000 mcg Oral Daily   Continuous Infusions:  sodium chloride 10 mL/hr at 07/07/21 2008   anidulafungin 78 mL/hr at 07/13/21 0645   ciprofloxacin Stopped (07/13/21 0606)   dextrose 5 % and 0.9 % NaCl with KCl 20 mEq/L 100 mL/hr at 07/13/21 0839   PRN Meds:.diphenhydrAMINE, famotidine, hydrALAZINE, HYDROmorphone (DILAUDID) injection, naphazoline-glycerin, ondansetron (ZOFRAN) IV, sodium chloride flush  SUBJECTIVE:   24 hour events:  Lft/platelet improving Afebrile since line removed Bcx repeat ngtd No n/v/diarrhea Back pain stable No LE weakness/numbness No bowel/bladder incontinence    Review of Systems  All other systems reviewed and are negative.  OBJECTIVE:   Blood pressure (!) 146/56, pulse 80, temperature 97.7 F (36.5 C), temperature source Oral, resp. rate 16, height 5' 8"  (1.727 m), weight 64.2 kg, SpO2 96 %. Body mass index is 21.52 kg/m.  General/constitutional: no distress, pleasant HEENT: Normocephalic, PER, Conj Clear, EOMI, Oropharynx clear Neck supple CV: rrr no mrg Lungs: clear to auscultation, normal respiratory effort Abd:  Soft, Nontender Ext: no edema Skin: No Rash Neuro: nonfocal MSK: mild mid lumbar spine tenderness     Lab Results: Lab Results  Component Value Date   WBC 3.0 (L) 07/13/2021   HGB 8.6 (L) 07/13/2021   HCT 26.1 (L) 07/13/2021   MCV 86.4 07/13/2021   PLT 93 (L) 07/13/2021    Lab Results  Component Value Date   NA 135 07/13/2021   K 4.3 07/13/2021   CO2 18 (L) 07/13/2021   GLUCOSE 104 (H) 07/13/2021   BUN 30 (H) 07/13/2021   CREATININE 1.60 (H) 07/13/2021   CALCIUM 9.4 07/13/2021   GFRNONAA 37 (L) 07/13/2021   GFRAA 55 (L) 08/12/2020    Lab Results  Component Value Date   ALT 147 (H) 07/13/2021   AST 53 (H) 07/13/2021   ALKPHOS 212 (H) 07/13/2021   BILITOT 1.2 07/13/2021       Component Value Date/Time   CRP 2.8 (H) 07/05/2021 2138       Component Value Date/Time   ESRSEDRATE 33 (H) 07/05/2021 2138     I have reviewed the micro and lab results in Epic.  Imaging:   8/10 abd pelv ct Unchanged mild wall thickening of the descending colon. No additional areas of bowel wall thickening are seen, although evaluation is somewhat limited due to lack of oral contrast and paucity of intra-abdominal fat.   No evidence of bowel obstruction or intra-abdominal abscess.  Imaging independently reviewed in Epic.    8/17 tee 1. Left ventricular ejection fraction, by estimation, is 60 to 65%. The  left ventricle has normal function. The left ventricle has no regional  wall motion abnormalities. There is mild concentric left ventricular  hypertrophy. Left ventricular diastolic  parameters are consistent with Grade I diastolic dysfunction (impaired  relaxation).   2. Right ventricular systolic function is normal. The right ventricular  size is normal. There is normal pulmonary artery systolic pressure.   3. Left atrial size was severely dilated.   4. Right atrial size was mildly dilated.   5. The mitral valve is normal in structure. No evidence of mitral valve   regurgitation. No evidence of mitral stenosis.   6. The aortic valve is tricuspid. Aortic valve regurgitation is not  visualized. No aortic stenosis is present.   7. The inferior vena cava is normal in size with greater than 50%  respiratory variability, suggesting right atrial pressure of 3 mmHg.      Jabier Mutton, [place] for [place] [phone]  pager   [phone] cell 07/13/2021, 12:21 PM

## 2021-07-13 NOTE — Progress Notes (Signed)
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 6333 complicated by frequent PICC line infections previous T2-T5 discitis discharged on [facility] 7/25-06/30/2021 Rx actinobacter bacteremia to be treated through 07/09/2021-chronic lumbar PICC line Chronic severe malnutrition BMI 21 CKD 3 Anemia of chronic disease  Admit 07/02/2021 signs of sepsis-ID consulted patient started on Vanco meropenem  Eventually found to be growing yeast in blood cultures 8/14  Hospital-Problem based course  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 Line holiday until 8/19 until 8/31, antibiotics narrowed to Cipro until 8/23 per ID Eraxis-->fluconazole until 8/31 negative so far Blood culture 8/16 repeat is pending TTE negative for any infection Outpatient ophthalmology follow-up, lumbar imaging as per ID Short gut syndrome 2/2 prior multiple surgeries for Crohn's TPN held till PICC Can be replaced Resumed Gattex at patient request-patient is taking this on her own Continue Protonix 40 daily, Creon 36,000 3 times daily, Pepcid 20 as needed Carafate twice daily on hold Continue Entocort 3 mg every 3 days Chronic pain Continue Robaxin 500 3 times daily Dilaudid DC 8/17 home methadone 5 times a day 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 1 unit PRBC 8/16-good response Get iron studies as outpatient Hypokalemia/hypomagnesemia Continue D5/LR with 40 of K and adjust as needed May require addition bicarb TPN related cholestasis Moderate to severe thrombocytopenia Follow as outpatient TCP 2/2 infection--this seems to be resolving Patient is also on estradiol oral tablets which have been held May require outpatient ultrasound liver-patient has had TPN for many years Elevated  D-dimer-lower extremity duplex ruled out DVT Monitor trends Hypotension with a history of hypertension Meds were adjusted-continue amlodipine 10 Coreg 6.25 twice daily, watch hydralazine 100 3 times daily Depression Outpatient follow-up-continue Cymbalta 60 daily, Wellbutrin 100 twice daily Seizure disorder Continue Keppra 500 twice daily   DVT prophylaxis: TED hose Code Status: Full Family Communication: None present Disposition:  Status is: Inpatient  Remains inpatient appropriate because:Hemodynamically unstable, Altered mental status, Ongoing diagnostic testing needed not appropriate for outpatient work up, and Unsafe d/c plan  Dispo: The patient is from: Home              Anticipated d/c is to: Home              Patient currently is not medically stable to d/c.   Difficult to place patient No       Consultants:  Infectious disease Dr. Gale Journey  Procedures: None yet  Antimicrobials:  Eraxis 8/15 Cefepime 8/10  Subjective:  About to give herself her injection No chest pain-some diarrhea  Objective: Vitals:   07/13/21 0700 07/13/21 0800 07/13/21 0900 07/13/21 1200  BP: (!) 143/68 (!) 165/92 (!) 152/69 (!) 146/56  Pulse: 71 (!) 56 (!) 47 80  Resp: 14 16 20 16   Temp:  98 F (36.7 C)  97.7 F (36.5 C)  TempSrc:  Oral  Oral  SpO2: 97% 98% 97% 96%  Weight:      Height:        Intake/Output Summary (Last 24 hours) at 07/13/2021 1313 Last data filed at 07/13/2021 1000 Gross per 24 hour  Intake 2625.59 ml  Output 600 ml  Net 2025.59 ml    Filed Weights   07/09/21 0607 07/10/21 0500 07/12/21 0500  Weight: 59.2 kg 60.6 kg 64.2 kg    Examination: Coherent awake  alert no distress EOMI NCAT no focal deficit CTA B Abdomen soft no rebound no guarding ROM intact  Data Reviewed: personally reviewed   CBC    Component Value Date/Time   WBC 3.0 (L) 07/13/2021 0318   RBC 3.02 (L) 07/13/2021 0318   HGB 8.6 (L) 07/13/2021 0318   HCT 26.1 (L) 07/13/2021 0318    PLT 93 (L) 07/13/2021 0318   MCV 86.4 07/13/2021 0318   MCH 28.5 07/13/2021 0318   MCHC 33.0 07/13/2021 0318   RDW 18.0 (H) 07/13/2021 0318   LYMPHSABS 1.5 07/13/2021 0318   MONOABS 0.3 07/13/2021 0318   EOSABS 0.0 07/13/2021 0318   BASOSABS 0.0 07/13/2021 0318   CMP Latest Ref Rng & Units 07/13/2021 07/12/2021 07/11/2021  Glucose 70 - 99 mg/dL 104(H) 116(H) 380(H)  BUN 6 - 20 mg/dL 30(H) 31(H) 30(H)  Creatinine 0.44 - 1.00 mg/dL 1.60(H) 1.64(H) 1.46(H)  Sodium 135 - 145 mmol/L 135 136 138  Potassium 3.5 - 5.1 mmol/L 4.3 4.4 3.4(L)  Chloride 98 - 111 mmol/L 112(H) 110 102  CO2 22 - 32 mmol/L 18(L) 20(L) 19(L)  Calcium 8.9 - 10.3 mg/dL 9.4 9.2 7.7(L)  Total Protein 6.5 - 8.1 g/dL 5.2(L) 5.1(L) 4.7(L)  Total Bilirubin 0.3 - 1.2 mg/dL 1.2 1.3(H) 1.9(H)  Alkaline Phos 38 - 126 U/L 212(H) 190(H) 177(H)  AST 15 - 41 U/L 53(H) 72(H) 141(H)  ALT 0 - 44 U/L 147(H) 175(H) 215(H)     Radiology Studies: ECHOCARDIOGRAM COMPLETE  Result Date: 07/12/2021    ECHOCARDIOGRAM REPORT   Patient Name:   Caitlin Peters Date of Exam: 07/12/2021 Medical Rec #:  [id number]        Height:       68.0 in Accession #:    [id number]       Weight:       141.5 lb Date of Birth:  April 28, 1961        BSA:          1.764 m Patient Age:    34 years         BP:           144/64 mmHg Patient Gender: F                HR:           70 bpm. Exam Location:  Inpatient Procedure: 2D Echo, Cardiac Doppler and Color Doppler STAT ECHO Indications:    Endocarditis I38  History:        Patient has prior history of Echocardiogram examinations, most                 recent 12/17/2020. Risk Factors:Hypertension and Former Smoker.                 CKD.  Sonographer:    Vickie Epley RDCS Referring Phys: [phone] [place]  1. Left ventricular ejection fraction, by estimation, is 60 to 65%. The left ventricle has normal function. The left ventricle has no regional wall motion abnormalities. There is mild concentric left ventricular  hypertrophy. Left ventricular diastolic parameters are consistent with Grade I diastolic dysfunction (impaired relaxation).  2. Right ventricular systolic function is normal. The right ventricular size is normal. There is normal pulmonary artery systolic pressure.  3. Left atrial size was severely dilated.  4. Right atrial size was mildly dilated.  5. The mitral valve is normal in structure. No evidence of mitral valve regurgitation. No evidence of mitral stenosis.  6. The aortic valve is tricuspid. Aortic valve regurgitation is not visualized. No aortic stenosis is present.  7. The inferior vena cava is normal in size with greater than 50% respiratory variability, suggesting right atrial pressure of 3 mmHg. FINDINGS  Left Ventricle: Left ventricular ejection fraction, by estimation, is 60 to 65%. The left ventricle has normal function. The left ventricle has no regional wall motion abnormalities. The left ventricular internal cavity size was normal in size. There is  mild concentric left ventricular hypertrophy. Left ventricular diastolic parameters are consistent with Grade I diastolic dysfunction (impaired relaxation). Indeterminate filling pressures. Right Ventricle: The right ventricular size is normal. No increase in right ventricular wall thickness. Right ventricular systolic function is normal. There is normal pulmonary artery systolic pressure. The tricuspid regurgitant velocity is 2.46 m/s, and  with an assumed right atrial pressure of 3 mmHg, the estimated right ventricular systolic pressure is 86.7 mmHg. Left Atrium: Left atrial size was severely dilated. Right Atrium: Right atrial size was mildly dilated. Pericardium: There is no evidence of pericardial effusion. Mitral Valve: The mitral valve is normal in structure. No evidence of mitral valve regurgitation. No evidence of mitral valve stenosis. Tricuspid Valve: The tricuspid valve is normal in structure. Tricuspid valve regurgitation is mild . No  evidence of tricuspid stenosis. Aortic Valve: The aortic valve is tricuspid. Aortic valve regurgitation is not visualized. No aortic stenosis is present. Pulmonic Valve: The pulmonic valve was normal in structure. Pulmonic valve regurgitation is not visualized. No evidence of pulmonic stenosis. Aorta: The aortic root is normal in size and structure. Venous: The inferior vena cava is normal in size with greater than 50% respiratory variability, suggesting right atrial pressure of 3 mmHg. IAS/Shunts: No atrial level shunt detected by color flow Doppler.  LEFT VENTRICLE PLAX 2D LVIDd:         4.00 cm      Diastology LVIDs:         2.70 cm      LV e' medial:    4.91 cm/s LV PW:         1.20 cm      LV E/e' medial:  14.4 LV IVS:        1.10 cm      LV e' lateral:   4.99 cm/s LVOT diam:     2.10 cm      LV E/e' lateral: 14.1 LV SV:         113 LV SV Index:   64 LVOT Area:     3.46 cm  LV Volumes (MOD) LV vol d, MOD A2C: 169.0 ml LV vol d, MOD A4C: 162.0 ml LV vol s, MOD A2C: 74.7 ml LV vol s, MOD A4C: 50.7 ml LV SV MOD A2C:     94.3 ml LV SV MOD A4C:     162.0 ml LV SV MOD BP:      106.2 ml RIGHT VENTRICLE RV S prime:     9.37 cm/s TAPSE (M-mode): 1.9 cm LEFT ATRIUM             Index       RIGHT ATRIUM           Index LA diam:        4.50 cm 2.55 cm/m  RA Area:     17.90 cm LA Vol (A2C):   89.2 ml 50.55 ml/m RA Volume:   53.30 ml  30.21 ml/m LA Vol (A4C):   50.7 ml 28.73 ml/m LA Biplane  Vol: 73.7 ml 41.77 ml/m  AORTIC VALVE LVOT Vmax:   132.00 cm/s LVOT Vmean:  93.400 cm/s LVOT VTI:    0.327 m  AORTA Ao Root diam: 3.40 cm MITRAL VALVE               TRICUSPID VALVE MV Area (PHT): 3.08 cm    TR Peak grad:   24.2 mmHg MV Decel Time: 246 msec    TR Vmax:        246.00 cm/s MV E velocity: 70.60 cm/s MV A velocity: 80.60 cm/s  SHUNTS MV E/A ratio:  0.88        Systemic VTI:  0.33 m                            Systemic Diam: 2.10 cm Skeet Latch MD Electronically signed by Skeet Latch MD Signature Date/Time:  07/12/2021/11:50:28 AM    Final      Scheduled Meds:  (feeding supplement) PROSource Plus  30 mL Oral BID BM   amLODipine  10 mg Oral Daily   budesonide  3 mg Oral Q3 days   carvedilol  6.25 mg Oral BID WC   Chlorhexidine Gluconate Cloth  6 each Topical Daily   cholecalciferol  1,000 Units Oral Daily   cycloSPORINE  1 drop Both Eyes BID   DULoxetine  60 mg Oral Daily   hydrALAZINE  100 mg Oral TID   levETIRAcetam  500 mg Oral BID   lipase/protease/amylase  36,000 Units Oral TID AC   methadone  5 mg Oral 5 X Daily   methocarbamol  500 mg Oral TID   pantoprazole  40 mg Oral Daily   Teduglutide (rDNA)  1.5 mg Subcutaneous Daily   vitamin B-12  1,000 mcg Oral Daily   Continuous Infusions:  anidulafungin 78 mL/hr at 07/13/21 0645   ciprofloxacin Stopped (07/13/21 0606)     LOS: 8 days   Time spent: [place], MD [facility] To contact the attending provider between 7A-7P or the covering provider during after hours 7P-7A, please log into the web site [email or web address] and access using universal [place] password for that web site. If you do not have the password, please call the hospital operator.  07/13/2021, 1:13 PM

## 2021-07-14 ENCOUNTER — Encounter (HOSPITAL_COMMUNITY): Payer: Self-pay | Admitting: Internal Medicine

## 2021-07-14 ENCOUNTER — Inpatient Hospital Stay (HOSPITAL_COMMUNITY): Payer: Medicare Other

## 2021-07-14 DIAGNOSIS — A419 Sepsis, unspecified organism: Secondary | ICD-10-CM | POA: Diagnosis not present

## 2021-07-14 DIAGNOSIS — T80211A Bloodstream infection due to central venous catheter, initial encounter: Secondary | ICD-10-CM | POA: Diagnosis not present

## 2021-07-14 DIAGNOSIS — K7200 Acute and subacute hepatic failure without coma: Secondary | ICD-10-CM | POA: Diagnosis not present

## 2021-07-14 DIAGNOSIS — R6520 Severe sepsis without septic shock: Secondary | ICD-10-CM | POA: Diagnosis not present

## 2021-07-14 LAB — COMPREHENSIVE METABOLIC PANEL
ALT: 127 U/L — ABNORMAL HIGH (ref 0–44)
AST: 42 U/L — ABNORMAL HIGH (ref 15–41)
Albumin: 2.2 g/dL — ABNORMAL LOW (ref 3.5–5.0)
Alkaline Phosphatase: 264 U/L — ABNORMAL HIGH (ref 38–126)
Anion gap: 6 (ref 5–15)
BUN: 23 mg/dL — ABNORMAL HIGH (ref 6–20)
CO2: 19 mmol/L — ABNORMAL LOW (ref 22–32)
Calcium: 9.7 mg/dL (ref 8.9–10.3)
Chloride: 114 mmol/L — ABNORMAL HIGH (ref 98–111)
Creatinine, Ser: 1.58 mg/dL — ABNORMAL HIGH (ref 0.44–1.00)
GFR, Estimated: 37 mL/min — ABNORMAL LOW (ref >60)
Glucose, Bld: 98 mg/dL (ref 70–99)
Potassium: 4.2 mmol/L (ref 3.5–5.1)
Sodium: 139 mmol/L (ref 135–145)
Total Bilirubin: 0.9 mg/dL (ref 0.3–1.2)
Total Protein: 5.6 g/dL — ABNORMAL LOW (ref 6.5–8.1)

## 2021-07-14 MED ORDER — ACETAMINOPHEN 500 MG PO TABS: 1000.0000 mg | ORAL_TABLET | Freq: Three times a day (TID) | ORAL | 0 refills | Status: AC

## 2021-07-14 MED ORDER — MIDAZOLAM HCL 2 MG/2ML IJ SOLN
INTRAMUSCULAR | Status: AC
  Filled 2021-07-14: qty 2

## 2021-07-14 MED ORDER — MIDAZOLAM HCL 2 MG/2ML IJ SOLN
INTRAMUSCULAR | Status: AC | PRN
  Administered 2021-07-14 (×2): 1 mg via INTRAVENOUS

## 2021-07-14 MED ORDER — HEPARIN SOD (PORK) LOCK FLUSH 100 UNIT/ML IV SOLN
250.0000 [IU] | INTRAVENOUS | Status: DC | PRN
  Filled 2021-07-14: qty 2.5

## 2021-07-14 MED ORDER — CARVEDILOL 6.25 MG PO TABS: 6.2500 mg | ORAL_TABLET | Freq: Two times a day (BID) | ORAL | 2 refills | Status: DC

## 2021-07-14 MED ORDER — FLUCONAZOLE IV FOR PTA / DISCHARGE USE ONLY): 200.0000 mg | INTRAVENOUS | 0 refills | Status: AC

## 2021-07-14 MED ORDER — FENTANYL CITRATE (PF) 100 MCG/2ML IJ SOLN
INTRAMUSCULAR | Status: AC | PRN
  Administered 2021-07-14 (×3): 50 ug via INTRAVENOUS

## 2021-07-14 MED ORDER — LIDOCAINE HCL (PF) 1 % IJ SOLN
INTRAMUSCULAR | Status: AC | PRN
  Administered 2021-07-14 (×2): 10 mL via INTRADERMAL

## 2021-07-14 MED ORDER — FENTANYL CITRATE (PF) 100 MCG/2ML IJ SOLN
INTRAMUSCULAR | Status: AC
  Filled 2021-07-14: qty 2

## 2021-07-14 MED ORDER — TRAVASOL 10 % IV SOLN
INTRAVENOUS | Status: DC
  Filled 2021-07-14: qty 900

## 2021-07-14 MED ORDER — FENTANYL CITRATE (PF) 100 MCG/2ML IJ SOLN
INTRAMUSCULAR | Status: AC | PRN
  Administered 2021-07-14: 50 ug via INTRAVENOUS

## 2021-07-14 MED ORDER — LIDOCAINE HCL 1 % IJ SOLN
INTRAMUSCULAR | Status: AC
  Administered 2021-07-14: 3 mL via SUBCUTANEOUS
  Filled 2021-07-14: qty 20

## 2021-07-14 MED ORDER — HYDROMORPHONE HCL 2 MG PO TABS
4.0000 mg | ORAL_TABLET | Freq: Four times a day (QID) | ORAL | Status: DC | PRN
  Administered 2021-07-14: 4 mg via ORAL
  Filled 2021-07-14: qty 2

## 2021-07-14 NOTE — Discharge Summary (Addendum)
Physician Discharge Summary  Caitlin Peters JME:[id number] DOB: 01/25/1961 DOA: 07/05/2021  PCP: [name], [name], MD  Admit date: 07/05/2021 Discharge date: 07/14/2021  Time spent: 46 minutes  Recommendations for Outpatient Follow-up:  Complete antibiotics as per protocol Reinitiate TPN via central line Outpatient follow-up with Dr. Brayton Layman [facility] Needs outpatient consideration for ophthalmology appointment to be scheduled by her PCP who I will CC on this note Suggest outpatient ultrasound liver given chronic TPN since 2003-risk for steatosis/cholestasis long-term  Discharge Diagnoses:  MAIN problem for hospitalization   candida infection with sepsis on admit  Please see below for itemized issues addressed in [place]- refer to other progress notes for clarity if needed  Discharge Condition: fair  Diet recommendation: tpn, soft oral  Filed Weights   07/10/21 0500 07/12/21 0500 07/14/21 0955  Weight: 60.6 kg 64.2 kg 66.1 kg    History of present illness:  60 year old black female-Crohn's diagnosed 1984 status post colon resection complicated by leak-multiple small bowel resections between 2001 and 2009-TPN dependent since 2003-bowel surgery to lengthen the bowel?  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 Line holiday until 8/19 until 8/31,  narrowed to Cipro  and stopped by ID 8/19 per ID Eraxis-->fluconazole until 8/31 negative so far Blood culture 8/16 repeat  NEGATIVE--Femoral line to be placed by IR today TTE negative for any infection Outpatient ophthalmology follow-up, lumbar imaging 8/18 shows NO DISCITIS Short gut syndrome 2/2 prior multiple surgeries for Crohn's TPN held till PICC Can be replaced Resumed Gattex at patient request-patient is taking this on her own Continue Protonix 40 daily, Creon 36,000 3 times daily, Pepcid 20 as needed Carafate resumed Continue Entocort 3 mg every 3 days Chronic pain Continue Robaxin 500 3 times daily Dilaudid DC 8/17 home methadone 5 times a day to be resumed given her chronic pain issues and this will need to be followed up with her primary care physician 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 1 unit PRBC 8/16-hemoglobin stable iron studies as outpatient Hypokalemia/hypomagnesemia Meds and labs were adjusted along with IV fluids-patient's TPN will need to be adjusted in the outpatient setting for micronutrients in addition TPN related cholestasis Moderate to severe thrombocytopenia Follow as outpatient TCP 2/2 infection--this seems to be resolving Patient is also on estradiol oral tablets which have been held May require outpatient ultrasound liver-patient has had TPN for many years Elevated D-dimer-lower extremity duplex ruled out DVT Monitor trends Hypotension resolved--now hypertension Meds were adjusted-see MAR for final meds Depression Outpatient follow-up-continue Cymbalta 60 daily, Wellbutrin 100 twice daily Seizure disorder Continue Keppra 500 twice daily    Consultations: Infectious disease  Discharge Exam: Vitals:   07/14/21 1127 07/14/21 1200  BP:  (!) 170/67  Pulse: 72 72  Resp: 16 17  Temp:  97.8 F (36.6 C)  SpO2: 97% 94%    Subj on day of d/c   Well happy alert oriented no distress  General Exam on discharge  EOMI NCAT no focal deficit looks much more comfortable  abdomen soft no rebound no guarding ROM intact  no focal deficit power 5/5 S1-S2 no murmur sinus  rhythm  Discharge Instructions   Discharge Instructions     [facility] pharmacist to adjust dose for Vancomycin, Aminoglycosides and other anti-infective therapies as requested by physician.   Complete by: As directed    [facility] to provide Cath Flo 41m   Complete by: As directed    Administer for PICC line occlusion and as ordered by physician for other access device issues.   Anaphylaxis Kit: Provided to treat any anaphylactic reaction to the medication being provided to the patient if First Dose or when requested by physician   Complete by: As directed    Epinephrine 158mml vial / amp: Administer 0.40m41m0.40ml2mubcutaneously once for moderate to severe anaphylaxis, nurse to call physician and pharmacy when reaction occurs and call 911 if needed for immediate care   Diphenhydramine 50mg51mIV vial: Administer 25-50mg 52mM PRN for first dose reaction, rash, itching, mild reaction, nurse to call physician and pharmacy when reaction occurs   Sodium Chloride 0.9% NS 500ml I58mdminister if needed for hypovolemic blood pressure drop or as ordered by physician after call to physician with anaphylactic reaction   Change dressing on IV access line weekly and PRN   Complete by: As directed    Diet - low sodium heart healthy   Complete by: As directed    Discharge instructions   Complete by: As directed    Continue TPN as per protocol through home health You will need to complete all of the antibiotics that have been prescribed for you as per the infectious disease specialist which has been set up for you already I would recommend you [facility] Best of luck if you have increasing diarrhea shortness of breath fever chills or other concerns please seek immediate medical attention, preferably at your tertiary care facility   Flush IV access with Sodium Chloride 0.9% and Heparin 10 units/ml or 100  units/ml   Complete by: As directed    Home infusion instructions - [facility]   Complete by: As directed    Instructions: Flush IV access with Sodium Chloride 0.9% and Heparin 10units/ml or 100units/ml   Change dressing on IV access line: Weekly and PRN   Instructions Cath Flo 2mg: Ad64mister for PICC Line occlusion and as ordered by physician for other access device   [facility] pharmacist to adjust dose for: Vancomycin, Aminoglycosides and other anti-infective therapies as requested by physician   Increase activity slowly   Complete by: As directed    Method of administration may be changed at the discretion of home infusion pharmacist based upon assessment of the patient and/or caregiver's ability to self-administer the medication ordered   Complete by: As directed    No wound care   Complete by: As directed       Allergies as of 07/14/2021       Reactions   Meperidine Hives   Other reaction(s): GI Upset Due to Chrones   Hyoscyamine Hives, Swelling   Legs swelling Disorientation   Cefepime Other (See Comments)   Neurotoxicity occurring in setting of AKI. Ceftriaxone tolerated during same admit   Gabapentin Other (See Comments)   unknown   Lyrica [pregabalin] Other (See Comments)   unknown   Topamax [topiramate] Other (See Comments)   unknown   Zosyn [piperacillin Sod-tazobactam So] Other (See Comments)   Patient reports it makes her  vomit, her neck stiff, and her "heart feel funny"   Fentanyl Rash   Pt is allergic to fentanyl patch related to the glue (gives her a rash) Pt states she is NOT allergic to fentanyl IV medicine   Morphine And Related Rash        Medication List     STOP taking these medications    ciprofloxacin 500 MG tablet Commonly known as: CIPRO   denosumab 60 MG/ML Sosy injection Commonly known as: PROLIA   PROBIOTIC-10 PO       TAKE these medications    (feeding supplement) PROSource Plus liquid Take 30 mLs by  mouth 2 (two) times daily between meals.   feeding supplement Liqd Take 237 mLs by mouth daily.   acetaminophen 500 MG tablet Commonly known as: TYLENOL Take 2 tablets (1,000 mg total) by mouth 3 (three) times daily for 7 days.   amLODipine 10 MG tablet Commonly known as: NORVASC Take 10 mg by mouth daily.   BIOFREEZE EX Apply 1 application topically as needed (pain).   budesonide 3 MG 24 hr capsule Commonly known as: ENTOCORT EC Take 1 capsule (3 mg total) by mouth every 3 (three) days.   buPROPion ER 100 MG 12 hr tablet Commonly known as: WELLBUTRIN SR Take 200 mg by mouth daily.   Calcium 200 MG Tabs Take 200 mg by mouth daily.   carvedilol 6.25 MG tablet Commonly known as: COREG Take 1 tablet (6.25 mg total) by mouth 2 (two) times daily with a meal. What changed:  medication strength how much to take   cholecalciferol 25 MCG (1000 UNIT) tablet Commonly known as: VITAMIN D3 Take 1,000 Units by mouth daily.   cycloSPORINE 0.05 % ophthalmic emulsion Commonly known as: RESTASIS Place 1 drop into both eyes 2 (two) times daily.   dexlansoprazole 60 MG capsule Commonly known as: Dexilant Take 1 capsule (60 mg total) by mouth daily.   DULoxetine 60 MG capsule Commonly known as: CYMBALTA Take 2 capsules (120 mg total) by mouth daily.   estradiol 2 MG tablet Commonly known as: ESTRACE Take 1 tablet (2 mg total) by mouth daily.   famotidine 20 MG tablet Commonly known as: PEPCID Take 20 mg by mouth daily as needed for heartburn or indigestion.   fluconazole  IVPB Commonly known as: DIFLUCAN Inject 200 mg into the vein daily for 12 days. Indication:  Candidemia  First Dose: No Last Day of Therapy:  07/26/2021 Labs - Once weekly:  CBC/D and BMP, Labs - Every other week:  ESR and CRP Method of administration: Premix bag / Control-A-Flo (CAF) Method of administration may be changed at the discretion of the patient and/or caregiver's ability to self-administer the  medication ordered.   Gattex 5 MG Kit Generic drug: Teduglutide (rDNA) Inject 1.5 mg into the skin daily.   hydrALAZINE 100 MG tablet Commonly known as: APRESOLINE Take 1 tablet (100 mg total) by mouth 3 (three) times daily.   HYDROmorphone 4 MG tablet Commonly known as: DILAUDID Take 4 mg by mouth in the morning, at noon, in the evening, and at bedtime.   levETIRAcetam 500 MG tablet Commonly known as: KEPPRA Take 500 mg by mouth 2 (two) times daily.   lidocaine 5 % Commonly known as: LIDODERM Place 1 patch onto the skin daily. Remove & Discard patch within 12 hours or as directed by MD   lipase/protease/amylase 36000 UNITS Cpep capsule Commonly known as: Creon Take 1 capsule (36,000 Units total) by mouth See  admin instructions. 36000 units with meals   methadone 5 MG tablet Commonly known as: DOLOPHINE Take 1 tablet (5 mg total) by mouth 5 (five) times daily.   methocarbamol 500 MG tablet Commonly known as: Robaxin Take 1 tablet (500 mg total) by mouth in the morning and at bedtime.   MULTIVITAMIN ADULT PO Take 1 tablet by mouth daily.   Narcan 4 MG/0.1ML Liqd nasal spray kit Generic drug: naloxone Place 1 spray into the nose once as needed (overdose).   polyvinyl alcohol 1.4 % ophthalmic solution Commonly known as: LIQUIFILM TEARS Place 1 drop into both eyes as needed for dry eyes.   PRESCRIPTION MEDICATION Inject 1 each into the vein daily. Home TPN . [facility] . 1 bag for 12 hours. [id number]   promethazine 25 MG tablet Commonly known as: PHENERGAN Take 1 tablet (25 mg total) by mouth every 6 (six) hours as needed for nausea.   sodium chloride 0.9 % infusion Inject 1 mL into the vein daily as needed (flush).   sucralfate 1 GM/10ML suspension Commonly known as: Carafate Take 10 mLs (1 g total) by mouth 2 (two) times daily.   TRALEMENT IV Inject 1 mL into the vein See admin instructions. Used in TPN bag 4 times weekly    vitamin B-12 1000 MCG tablet Commonly known as: CYANOCOBALAMIN Take 1,000 mcg by mouth daily.               Discharge Care Instructions  (From admission, onward)           Start     Ordered   07/14/21 0000  Change dressing on IV access line weekly and PRN  (Home infusion instructions - [facility] )        07/14/21 1422           Allergies  Allergen Reactions   Meperidine Hives    Other reaction(s): GI Upset Due to Chrones    Hyoscyamine Hives and Swelling    Legs swelling Disorientation   Cefepime Other (See Comments)    Neurotoxicity occurring in setting of AKI. Ceftriaxone tolerated during same admit   Gabapentin Other (See Comments)    unknown   Lyrica [Pregabalin] Other (See Comments)    unknown   Topamax [Topiramate] Other (See Comments)    unknown   Zosyn [Piperacillin Sod-Tazobactam So] Other (See Comments)    Patient reports it makes her vomit, her neck stiff, and her "heart feel funny"   Fentanyl Rash    Pt is allergic to fentanyl patch related to the glue (gives her a rash) Pt states she is NOT allergic to fentanyl IV medicine   Morphine And Related Rash      The results of significant diagnostics from this hospitalization (including imaging, microbiology, ancillary and laboratory) are listed below for reference.    Significant Diagnostic Studies: CT Abdomen Pelvis Wo Contrast  Result Date: 06/19/2021 CLINICAL DATA:  60 year old female with abdominal pain and fever. EXAM: CT ABDOMEN AND PELVIS WITHOUT CONTRAST TECHNIQUE: Multidetector CT imaging of the abdomen and pelvis was performed following the standard protocol without IV contrast. COMPARISON:  CT abdomen pelvis dated 06/12/2021. FINDINGS: Evaluation of this exam is limited in the absence of intravenous contrast. Evaluation is also limited due to paucity of intra-abdominal fat. Lower chest: Minimal bibasilar dependent atelectasis. The visualized lung bases are otherwise clear.  No intra-abdominal free air or free fluid. Hepatobiliary: The liver is unremarkable. There is pneumobilia. Cholecystectomy. Pancreas:  The pancreas is grossly unremarkable. Spleen: Normal in size without focal abnormality. Adrenals/Urinary Tract: The adrenal glands are unremarkable. The kidneys, visualized ureters, and urinary bladder appear unremarkable. Stomach/Bowel: Postsurgical changes of bowel with multiple surgical anastomotic sutures. Evaluation of the bowel is limited in the absence of oral contrast and paucity of intra-abdominal fat. No evidence of bowel obstruction. Vascular/Lymphatic: Mild aortoiliac atherosclerotic disease. Right femoral venous line with tip in the intrahepatic IVC. Several small scattered retroperitoneal and mesenteric lymph nodes. Reproductive: Hysterectomy. Other: There is diffuse subcutaneous edema. No fluid collection. Musculoskeletal: Osteopenia with degenerative changes of the spine. Avascular necrosis of the left femoral head without acute fracture or cortical collapse. No acute osseous pathology. IMPRESSION: 1. No acute intra-abdominal or pelvic pathology. 2. Postsurgical changes of bowel. No evidence of bowel obstruction. 3. Avascular necrosis of the left femoral head without acute fracture or cortical collapse. 4. Aortic Atherosclerosis (ICD10-I70.0). Electronically Signed   By: Anner Crete M.D.   On: 06/19/2021 23:22   CT LUMBAR SPINE WO CONTRAST  Result Date: 07/13/2021 CLINICAL DATA:  history lumbar osteomyelitis; recurrent bacteremia and persistent pain EXAM: CT LUMBAR SPINE WITHOUT CONTRAST TECHNIQUE: Multidetector CT imaging of the lumbar spine was performed without intravenous contrast administration. Multiplanar CT image reconstructions were also generated. COMPARISON:  MRI of the lumbar spine 05/09/2021. CT abdomen/pelvis 10/12/2019. FINDINGS: Segmentation: 5 lumbar vertebrae. The caudal most well-formed intervertebral disc space is designated L5-S1.  Alignment: Lumbar levocurvature. Straightening of the expected lumbar lordosis. Trace L5-S1 grade 1 anterolisthesis. Vertebrae: Unchanged mild chronic T12 superior endplate compression deformity. Vertebral body height is otherwise maintained. There is no appreciable endplate erosion to suggest discitis/osteomyelitis. Irregularity of the articular pillars at L4-L5 and L5-S1 is similar to the prior CT abdomen/pelvis 10/12/2019, and likely related to facet arthrosis at these sites. Paraspinal and other soft tissues: Nonspecific edema within the paraspinal soft tissues. Incompletely assessed postsurgical changes to the bowel. Aortic atherosclerosis. Disc levels: Intervertebral disc height is maintained. T11-T12: Facet arthrosis. No appreciable significant disc herniation, spinal canal stenosis or neural foraminal narrowing. T12-L1: Minimal facet arthrosis. No appreciable significant disc herniation, spinal canal stenosis or neural foraminal narrowing. L1-L2: Small disc bulge. No appreciable significant spinal canal stenosis or neural foraminal narrowing. L2-L3: Small disc bulge. Mild facet arthrosis/ligamentum flavum hypertrophy. No appreciable significant spinal canal or foraminal stenosis. L3-L4: Disc bulge. Facet arthrosis/ligamentum flavum hypertrophy. Redemonstrated right subarticular narrowing. No appreciable significant no significant neural foraminal narrowing. Central canal stenosis. L4-L5: Disc bulge. Moderate facet arthrosis with ligamentum flavum hypertrophy. Redemonstrated bilateral subarticular stenosis. No more than mild appreciable central canal stenosis. No significant neural foraminal narrowing. L5-S1: Grade 1 anterolisthesis. Disc bulge. Moderate facet arthrosis with ligamentum flavum hypertrophy. Redemonstrated bilateral subarticular narrowing. No appreciable significant central canal stenosis or neural foraminal narrowing. IMPRESSION: No CT evidence of discitis/osteomyelitis within the lumbar spine.  There is irregularity of the bilateral articular pillars at L4-L5 and L5-S1, which is similar as compared to the CT abdomen/pelvis of 10/12/2019 and favored secondary to degenerative facet arthrosis (rather than septic arthritis). However, please note a contrast-enhanced lumbar spine MRI would have greater sensitivity for discitis/osteomyelitis and for septic arthritis. Lumbar spondylosis, as outlined. Redemonstrated subarticular narrowing on the right at L3-L4, bilaterally at L4-L5, and bilaterally at L5-S1. No more than mild appreciable central canal stenosis. No appreciable significant foraminal stenosis. Redemonstrated mild chronic T12 superior endplate compression deformity. Nonspecific edema within the dorsal paraspinal soft tissues bilaterally. Electronically Signed   By: Kellie Simmering D.O.   On: 07/13/2021  18:17   MR CERVICAL SPINE WO CONTRAST  Result Date: 06/26/2021 CLINICAL DATA:  Cervical radiculopathy and shoulder pain with infection suspected. EXAM: MRI CERVICAL SPINE WITHOUT CONTRAST TECHNIQUE: Multiplanar, multisequence MR imaging of the cervical spine was performed. No intravenous contrast was administered. COMPARISON:  06/02/2021 FINDINGS: Motion degraded study. Alignment: Physiologic. Vertebrae: No fracture, evidence of discitis, or bone lesion. Cord: Normal signal and morphology. Posterior Fossa, vertebral arteries, paraspinal tissues: Negative. Disc levels: C1-2: Unremarkable. C2-3: Normal disc space and facet joints. There is no spinal canal stenosis. No neural foraminal stenosis. C3-4: Normal disc space and facet joints. There is no spinal canal stenosis. No neural foraminal stenosis. C4-5: Normal disc space and facet joints. There is no spinal canal stenosis. No neural foraminal stenosis. C5-6: Small disc bulge uncovertebral spurring. There is no spinal canal stenosis. Mild right and moderate left neural foraminal stenosis. C6-7: Disc space narrowing with endplate spurring. There is no  spinal canal stenosis. Mild bilateral neural foraminal stenosis. C7-T1: Normal disc space and facet joints. There is no spinal canal stenosis. No neural foraminal stenosis. IMPRESSION: 1. Motion degraded study. 2. No acute abnormality of the cervical spine. 3. Mild right and moderate left neural foraminal stenosis at C5-6. 4. Mild bilateral C6-7 neural foraminal stenosis. Electronically Signed   By: Ulyses Jarred M.D.   On: 06/26/2021 23:23   CT ABDOMEN PELVIS W CONTRAST  Result Date: 07/05/2021 CLINICAL DATA:  Abdominal pain, history of Crohn's EXAM: CT ABDOMEN AND PELVIS WITH CONTRAST TECHNIQUE: Multidetector CT imaging of the abdomen and pelvis was performed using the standard protocol following bolus administration of intravenous contrast. CONTRAST:  62m OMNIPAQUE IOHEXOL 350 MG/ML SOLN COMPARISON:  CT abdomen pelvis dated June 15, 2021; CT abdomen and pelvis dated June 12, 2021 FINDINGS: Lower chest: Mild cardiomegaly. No pericardial effusion. Visualized lungs demonstrate bibasilar atelectasis. Hepatobiliary: Pneumobilia. Cholecystectomy. Mild biliary ductal dilation, not unexpected status post cholecystectomy. Pancreas: Unchanged mild prominence of the pancreatic duct. No surrounding inflammatory changes. Spleen: Normal in size without focal abnormality. Adrenals/Urinary Tract: Adrenal glands are unremarkable. Kidneys are normal, without renal calculi, focal lesion, or hydronephrosis. Bladder is unremarkable. Stomach/Bowel: Extensive postsurgical changes of the bowel. Unchanged mild wall thickening of the descending colon. No additional areas of bowel wall thickening are seen, although evaluation is somewhat limited due to lack of oral contrast and paucity of intra-abdominal fat. No evidence of obstruction. Vascular/Lymphatic: Right lower extremity venous line with tip extending into the intrahepatic IVC. Normal caliber abdominal aorta with mild atherosclerotic disease. Reproductive: Status post  hysterectomy. No adnexal masses. Other: No abdominal wall hernia or abnormality. No intra-abdominal fluid collections. Musculoskeletal: No acute or significant osseous findings. IMPRESSION: Unchanged mild wall thickening of the descending colon. No additional areas of bowel wall thickening are seen, although evaluation is somewhat limited due to lack of oral contrast and paucity of intra-abdominal fat. No evidence of bowel obstruction or intra-abdominal abscess. Electronically Signed   By: LYetta GlassmanMD   On: 07/05/2021 12:45   MR SHOULDER LEFT WO CONTRAST  Result Date: 06/27/2021 CLINICAL DATA:  Septic arthritis suspected, shoulder, xray done EXAM: MRI OF THE LEFT SHOULDER WITHOUT CONTRAST TECHNIQUE: Multiplanar, multisequence MR imaging of the shoulder was performed. No intravenous contrast was administered. COMPARISON:  MRI 05/23/2021, x-ray 06/12/2021 FINDINGS: Technical note: Despite efforts by the technologist and patient, motion artifact is present on today's exam and could not be eliminated. This reduces exam sensitivity and specificity. Rotator cuff: Mild tendinosis of the distal supraspinatus tendon anteriorly.  Remaining rotator cuff tendons are grossly intact. Muscles:  Mild supraspinatus intramuscular edema. Biceps long head: Grossly intact. Trace tenosynovial fluid, similar to prior. Acromioclavicular Joint: No significant arthropathy of the AC joint. No AC joint effusion. Trace subacromial-subdeltoid bursal fluid. Glenohumeral Joint: Mild chondral thinning.  No joint effusion. Labrum: Grossly intact, but evaluation is limited by motion artifact and lack of intraarticular fluid. Bones: No acute fracture or dislocation. No erosion. No bone marrow edema. No suspicious bone lesion. Other: No soft tissue edema or fluid collection. No visible lymphadenopathy. IMPRESSION: 1. No evidence of septic arthritis or osteomyelitis involving the left shoulder. 2. Mild tendinosis of the distal supraspinatus  tendon anteriorly. Negative for rotator cuff tear. 3. Mild supraspinatus intramuscular edema which could reflect a muscle strain. Early changes of myositis could have a similar appearance. Electronically Signed   By: Davina Poke D.O.   On: 06/27/2021 08:09   IR Removal Tun Cv Cath W/O FL  Result Date: 07/11/2021 INDICATION: Patient with history of short gut syndrome secondary to Crohn's disease, poor venous access with prior right common femoral vein tunneled central catheter placement; now with fungemia and persistent fevers; request received for catheter removal EXAM: REMOVAL TUNNELED CENTRAL VENOUS CATHETER MEDICATIONS: None ANESTHESIA/SEDATION: None FLUOROSCOPY TIME:  None COMPLICATIONS: None immediate. PROCEDURE: Informed consent was obtained from the patient after a thorough discussion of the procedural risks, benefits and alternatives. All questions were addressed. Maximal Sterile Barrier Technique was utilized including caps, mask, sterile gowns, sterile gloves, sterile drape, hand hygiene and skin antiseptic. A timeout was performed prior to the initiation of the procedure. [name] right upper thigh region and catheter was prepped and draped in a normal sterile fashion. Sutures were removed and using gentle manual traction the cuff of the catheter was exposed and the catheter was removed in it's entirety. Pressure was held till hemostasis was obtained. A sterile dressing was applied. The patient tolerated the procedure well with no immediate complications. IMPRESSION: Successful catheter removal as described above. Read by: Rowe Robert, PA-C Electronically Signed   By: Markus Daft M.D.   On: 07/11/2021 10:03   DG Chest Port 1 View  Result Date: 06/19/2021 CLINICAL DATA:  Questionable sepsis EXAM: PORTABLE CHEST 1 VIEW COMPARISON:  05/06/2021 FINDINGS: The heart size and mediastinal contours are within normal limits. Both lungs are clear. The visualized skeletal structures are  unremarkable. IMPRESSION: No acute abnormality of the lungs in AP portable projection. Electronically Signed   By: Eddie Candle M.D.   On: 06/19/2021 20:56   DG Abd Portable 1V  Result Date: 07/08/2021 CLINICAL DATA:  Abdominal pain EXAM: PORTABLE ABDOMEN - 1 VIEW COMPARISON:  07/05/2021 CT FINDINGS: Scattered large and small bowel gas is noted. Right femoral line is noted extending into intrahepatic IVC. Scattered large and small bowel gas is noted. No obstructive changes are seen. Overall appearance is similar to that seen on the previous exam. No bony abnormality is noted. IMPRESSION: No acute abnormality noted. No significant change from the prior exam. Electronically Signed   By: Inez Catalina M.D.   On: 07/08/2021 19:08   ECHOCARDIOGRAM COMPLETE  Result Date: 07/12/2021    ECHOCARDIOGRAM REPORT   Patient Name:   Caitlin Peters Date of Exam: 07/12/2021 Medical Rec #:  [id number]        Height:       68.0 in Accession #:    [id number]       Weight:       141.5 lb Date  of Birth:  1960-12-07        BSA:          1.764 m Patient Age:    32 years         BP:           144/64 mmHg Patient Gender: F                HR:           70 bpm. Exam Location:  Inpatient Procedure: 2D Echo, Cardiac Doppler and Color Doppler STAT ECHO Indications:    Endocarditis I38  History:        Patient has prior history of Echocardiogram examinations, most                 recent 12/17/2020. Risk Factors:Hypertension and Former Smoker.                 CKD.  Sonographer:    Vickie Epley RDCS Referring Phys: [phone] [place]  1. Left ventricular ejection fraction, by estimation, is 60 to 65%. The left ventricle has normal function. The left ventricle has no regional wall motion abnormalities. There is mild concentric left ventricular hypertrophy. Left ventricular diastolic parameters are consistent with Grade I diastolic dysfunction (impaired relaxation).  2. Right ventricular systolic function is normal. The right  ventricular size is normal. There is normal pulmonary artery systolic pressure.  3. Left atrial size was severely dilated.  4. Right atrial size was mildly dilated.  5. The mitral valve is normal in structure. No evidence of mitral valve regurgitation. No evidence of mitral stenosis.  6. The aortic valve is tricuspid. Aortic valve regurgitation is not visualized. No aortic stenosis is present.  7. The inferior vena cava is normal in size with greater than 50% respiratory variability, suggesting right atrial pressure of 3 mmHg. FINDINGS  Left Ventricle: Left ventricular ejection fraction, by estimation, is 60 to 65%. The left ventricle has normal function. The left ventricle has no regional wall motion abnormalities. The left ventricular internal cavity size was normal in size. There is  mild concentric left ventricular hypertrophy. Left ventricular diastolic parameters are consistent with Grade I diastolic dysfunction (impaired relaxation). Indeterminate filling pressures. Right Ventricle: The right ventricular size is normal. No increase in right ventricular wall thickness. Right ventricular systolic function is normal. There is normal pulmonary artery systolic pressure. The tricuspid regurgitant velocity is 2.46 m/s, and  with an assumed right atrial pressure of 3 mmHg, the estimated right ventricular systolic pressure is 07.6 mmHg. Left Atrium: Left atrial size was severely dilated. Right Atrium: Right atrial size was mildly dilated. Pericardium: There is no evidence of pericardial effusion. Mitral Valve: The mitral valve is normal in structure. No evidence of mitral valve regurgitation. No evidence of mitral valve stenosis. Tricuspid Valve: The tricuspid valve is normal in structure. Tricuspid valve regurgitation is mild . No evidence of tricuspid stenosis. Aortic Valve: The aortic valve is tricuspid. Aortic valve regurgitation is not visualized. No aortic stenosis is present. Pulmonic Valve: The pulmonic valve  was normal in structure. Pulmonic valve regurgitation is not visualized. No evidence of pulmonic stenosis. Aorta: The aortic root is normal in size and structure. Venous: The inferior vena cava is normal in size with greater than 50% respiratory variability, suggesting right atrial pressure of 3 mmHg. IAS/Shunts: No atrial level shunt detected by color flow Doppler.  LEFT VENTRICLE PLAX 2D LVIDd:         4.00 cm  Diastology LVIDs:         2.70 cm      LV e' medial:    4.91 cm/s LV PW:         1.20 cm      LV E/e' medial:  14.4 LV IVS:        1.10 cm      LV e' lateral:   4.99 cm/s LVOT diam:     2.10 cm      LV E/e' lateral: 14.1 LV SV:         113 LV SV Index:   64 LVOT Area:     3.46 cm  LV Volumes (MOD) LV vol d, MOD A2C: 169.0 ml LV vol d, MOD A4C: 162.0 ml LV vol s, MOD A2C: 74.7 ml LV vol s, MOD A4C: 50.7 ml LV SV MOD A2C:     94.3 ml LV SV MOD A4C:     162.0 ml LV SV MOD BP:      106.2 ml RIGHT VENTRICLE RV S prime:     9.37 cm/s TAPSE (M-mode): 1.9 cm LEFT ATRIUM             Index       RIGHT ATRIUM           Index LA diam:        4.50 cm 2.55 cm/m  RA Area:     17.90 cm LA Vol (A2C):   89.2 ml 50.55 ml/m RA Volume:   53.30 ml  30.21 ml/m LA Vol (A4C):   50.7 ml 28.73 ml/m LA Biplane Vol: 73.7 ml 41.77 ml/m  AORTIC VALVE LVOT Vmax:   132.00 cm/s LVOT Vmean:  93.400 cm/s LVOT VTI:    0.327 m  AORTA Ao Root diam: 3.40 cm MITRAL VALVE               TRICUSPID VALVE MV Area (PHT): 3.08 cm    TR Peak grad:   24.2 mmHg MV Decel Time: 246 msec    TR Vmax:        246.00 cm/s MV E velocity: 70.60 cm/s MV A velocity: 80.60 cm/s  SHUNTS MV E/A ratio:  0.88        Systemic VTI:  0.33 m                            Systemic Diam: 2.10 cm Skeet Latch MD Electronically signed by Skeet Latch MD Signature Date/Time: 07/12/2021/11:50:28 AM    Final    VAS [place] LOWER EXTREMITY VENOUS (DVT)  Result Date: 07/11/2021  Lower Venous DVT Study Patient Name:  Caitlin Peters  Date of Exam:   07/11/2021 Medical Rec  #: [id number]         Accession #:    [id number] Date of Birth: 01-04-1961         Patient Gender: F Patient Age:   69 years Exam Location:  [facility] Procedure:      VAS [place] LOWER EXTREMITY VENOUS (DVT) Referring Phys: Bretta Bang GONFA --------------------------------------------------------------------------------  Indications: Swelling.  Risk Factors: PICC placement RLE. Comparison Study: Previous exam 05/07/21 - negative Performing Technologist: Rogelia Rohrer RVT, RDMS  Examination Guidelines: A complete evaluation includes B-mode imaging, spectral Doppler, color Doppler, and power Doppler as needed of all accessible portions of each vessel. Bilateral testing is considered an integral part of a complete examination. Limited examinations for reoccurring indications may be performed as noted. The  reflux portion of the exam is performed with the patient in reverse Trendelenburg.  +---------+---------------+---------+-----------+----------+--------------+ RIGHT    CompressibilityPhasicitySpontaneityPropertiesThrombus Aging +---------+---------------+---------+-----------+----------+--------------+ CFV      Full           Yes      Yes                                 +---------+---------------+---------+-----------+----------+--------------+ SFJ      Full                                                        +---------+---------------+---------+-----------+----------+--------------+ FV Prox  Full           Yes      Yes                                 +---------+---------------+---------+-----------+----------+--------------+ FV Mid   Full           Yes      Yes                                 +---------+---------------+---------+-----------+----------+--------------+ FV DistalFull           Yes      Yes                                 +---------+---------------+---------+-----------+----------+--------------+ PFV      Full                                                         +---------+---------------+---------+-----------+----------+--------------+ POP      Full           Yes      Yes                                 +---------+---------------+---------+-----------+----------+--------------+ PTV      Full                                                        +---------+---------------+---------+-----------+----------+--------------+ PERO     Full                                                        +---------+---------------+---------+-----------+----------+--------------+   +---------+---------------+---------+-----------+----------+--------------+ LEFT     CompressibilityPhasicitySpontaneityPropertiesThrombus Aging +---------+---------------+---------+-----------+----------+--------------+ CFV      Full           Yes      Yes                                 +---------+---------------+---------+-----------+----------+--------------+  SFJ      Full                                                        +---------+---------------+---------+-----------+----------+--------------+ FV Prox  Full           Yes      Yes                                 +---------+---------------+---------+-----------+----------+--------------+ FV Mid   Full           Yes      Yes                                 +---------+---------------+---------+-----------+----------+--------------+ FV DistalFull           Yes      Yes                                 +---------+---------------+---------+-----------+----------+--------------+ PFV      Full                                                        +---------+---------------+---------+-----------+----------+--------------+ POP      Full           Yes      Yes                                 +---------+---------------+---------+-----------+----------+--------------+ PTV      Full                                                         +---------+---------------+---------+-----------+----------+--------------+ PERO     Full                                                        +---------+---------------+---------+-----------+----------+--------------+     Summary: BILATERAL: - No evidence of deep vein thrombosis seen in the lower extremities, bilaterally. - No evidence of superficial venous thrombosis in the lower extremities, bilaterally. -No evidence of popliteal cyst, bilaterally.   *See table(s) above for measurements and observations. Electronically signed by Deitra Mayo MD on 07/11/2021 at 3:28:54 PM.    Final    US ABDOMEN LIMITED RUQ (LIVER/GB)  Result Date: 07/10/2021 CLINICAL DATA:  Elevated liver enzyme EXAM: ULTRASOUND ABDOMEN LIMITED RIGHT UPPER QUADRANT COMPARISON:  CT 07/05/2021, ultrasound 05/10/2021 FINDINGS: Gallbladder: Surgically absent Common bile duct: Diameter: 6.6 mm Liver: No focal lesion identified. Within normal limits in parenchymal echogenicity. Portal vein is patent on color Doppler imaging with normal direction of blood flow towards the liver.  Other: Right kidney appears echogenic. Small amount of right upper quadrant ascites IMPRESSION: 1. Status post cholecystectomy. 2. Trace ascites 3. Right kidney appears echogenic, correlate with appropriate laboratory values Electronically Signed   By: Donavan Foil M.D.   On: 07/10/2021 19:34    Microbiology: Recent Results (from the past 240 hour(s))  Blood culture (routine x 2)     Status: None   Collection Time: 07/05/21  8:45 AM   Specimen: BLOOD  Result Value Ref Range Status   Specimen Description   Final    BLOOD BLOOD RIGHT FOREARM Performed at [facility] [address] [place], [place] [address]    Special Requests   Final    BOTTLES DRAWN AEROBIC AND ANAEROBIC Blood Culture results may not be optimal due to an inadequate volume of blood received in culture bottles Performed at [facility] [address], [place], [place] [address]    Culture   Final    NO GROWTH 5 DAYS Performed at [facility], [place] [address] [place], [place] [address]    Report Status 07/10/2021 FINAL  Final  Blood culture (routine x 2)     Status: None   Collection Time: 07/05/21  9:10 AM   Specimen: BLOOD LEFT HAND  Result Value Ref Range Status   Specimen Description   Final    BLOOD LEFT HAND Performed at [facility] [address], [place], [place] [address]    Special Requests   Final    BOTTLES DRAWN AEROBIC AND ANAEROBIC Blood Culture adequate volume Performed at [facility] [address], [place], [place] [address]    Culture   Final    NO GROWTH 5 DAYS Performed at [facility], [place] [address], [place],  [address]    Report Status 07/10/2021 FINAL  Final  Resp Panel by RT-PCR (Flu A&B, Covid) Nasopharyngeal Swab     Status: None   Collection Time: 07/05/21  9:24 AM   Specimen: Nasopharyngeal Swab; Nasopharyngeal(NP) swabs in vial transport medium  Result Value Ref Range Status   SARS Coronavirus 2 by RT PCR NEGATIVE NEGATIVE Final    Comment: (NOTE) SARS-CoV-2 target nucleic acids are NOT DETECTED.  The SARS-CoV-2 RNA is generally detectable in upper respiratory specimens during the acute phase of infection. The lowest concentration of SARS-CoV-2 viral copies this assay can detect is 138 copies/mL. A negative result does not preclude SARS-Cov-2 infection and should not be used as the sole basis for treatment or other patient management decisions. A negative result may occur with  improper specimen collection/handling, submission of specimen other than nasopharyngeal swab, presence of viral mutation(s) within the areas targeted by this assay, and inadequate number of viral copies(<138 copies/mL). A negative result must be combined with clinical observations, patient history, and epidemiological information. The  expected result is Negative.  Fact Sheet for Patients:  EntrepreneurPulse.com.au  Fact Sheet for Healthcare Providers:  IncredibleEmployment.be  This test is no t yet approved or cleared by the [place] FDA and  has been authorized for detection and/or diagnosis of SARS-CoV-2 by FDA under an Emergency Use Authorization (EUA). This EUA will remain  in effect (meaning this test can be used) for the duration of the COVID-19 declaration under Section 564(b)(1) of the Act, 21 U.S.C.section 360bbb-3(b)(1), unless the authorization is terminated  or revoked sooner.       Influenza A by PCR NEGATIVE NEGATIVE Final   Influenza  B by PCR NEGATIVE NEGATIVE Final    Comment: (NOTE) The Xpert Xpress SARS-CoV-2/FLU/RSV plus assay is intended as an aid in the diagnosis of influenza from Nasopharyngeal swab specimens and should not be used as a sole basis for treatment. Nasal washings and aspirates are unacceptable for Xpert Xpress SARS-CoV-2/FLU/RSV testing.  Fact Sheet for Patients: EntrepreneurPulse.com.au  Fact Sheet for Healthcare Providers: IncredibleEmployment.be  This test is not yet approved or cleared by the [place] FDA and has been authorized for detection and/or diagnosis of SARS-CoV-2 by FDA under an Emergency Use Authorization (EUA). This EUA will remain in effect (meaning this test can be used) for the duration of the COVID-19 declaration under Section 564(b)(1) of the Act, 21 U.S.C. section 360bbb-3(b)(1), unless the authorization is terminated or revoked.  Performed at [facility], [place] [address], [place], [place] [address]   Culture, blood (routine x 2)     Status: Abnormal (Preliminary result)   Collection Time: 07/09/21  8:17 AM   Specimen: BLOOD  Result Value Ref Range Status   Specimen Description   Final    BLOOD BLOOD RIGHT HAND Performed at [facility] [address], [place], [place] [address]    Special Requests   Final    BOTTLES DRAWN AEROBIC ONLY Blood Culture results may not be optimal due to an inadequate volume of blood received in culture bottles Performed at [facility] [address], [place], [place] [address]    Culture  Setup Time   Final    AEROBIC BOTTLE ONLY YEAST CRITICAL VALUE NOTED.  VALUE IS CONSISTENT WITH PREVIOUSLY REPORTED AND CALLED VALUE. Performed at [facility], [place] [address] [place], [place] [address]    Culture CANDIDA PARAPSILOSIS (A)  Final   Report Status PENDING  Incomplete  Culture, blood (routine x 2)     Status: Abnormal (Preliminary result)   Collection Time: 07/09/21  8:17 AM   Specimen: BLOOD  Result Value Ref Range Status   Specimen Description   Final    BLOOD RIGHT ANTECUBITAL Performed at [facility] [address] [place], [place] [address]    Special Requests   Final    BOTTLES DRAWN AEROBIC ONLY Blood Culture results may not be optimal due to an inadequate volume of blood received in culture bottles Performed at [facility] [address], [place], [place] [address]    Culture  Setup Time (A)  Final    YEAST AEROBIC BOTTLE ONLY CRITICAL RESULT CALLED TO, READ BACK BY AND VERIFIED WITH: PHARM D D.WOFFORD ON [id number] AT 2481 BY E.PARRISH    Culture (A)  Final    CANDIDA PARAPSILOSIS Sent to [place] for further susceptibility testing. Performed at [facility], [place] [address] [place], [place] [address]    Report Status PENDING  Incomplete  Blood Culture ID Panel (Reflexed)     Status: Abnormal   Collection Time: 07/09/21  8:17 AM  Result Value Ref Range Status   Enterococcus faecalis NOT DETECTED NOT DETECTED Final   Enterococcus Faecium NOT DETECTED NOT DETECTED Final   Listeria monocytogenes NOT DETECTED NOT DETECTED Final   Staphylococcus species NOT DETECTED NOT DETECTED  Final   Staphylococcus aureus (BCID) NOT DETECTED NOT DETECTED Final   Staphylococcus epidermidis NOT DETECTED NOT DETECTED Final   Staphylococcus lugdunensis NOT DETECTED NOT DETECTED Final   Streptococcus species NOT DETECTED NOT DETECTED Final   Streptococcus agalactiae NOT DETECTED  NOT DETECTED Final   Streptococcus pneumoniae NOT DETECTED NOT DETECTED Final   Streptococcus pyogenes NOT DETECTED NOT DETECTED Final   A.calcoaceticus-baumannii NOT DETECTED NOT DETECTED Final   Bacteroides fragilis NOT DETECTED NOT DETECTED Final   Enterobacterales NOT DETECTED NOT DETECTED Final   Enterobacter cloacae complex NOT DETECTED NOT DETECTED Final   Escherichia coli NOT DETECTED NOT DETECTED Final   Klebsiella aerogenes NOT DETECTED NOT DETECTED Final   Klebsiella oxytoca NOT DETECTED NOT DETECTED Final   Klebsiella pneumoniae NOT DETECTED NOT DETECTED Final   Proteus species NOT DETECTED NOT DETECTED Final   Salmonella species NOT DETECTED NOT DETECTED Final   Serratia marcescens NOT DETECTED NOT DETECTED Final   Haemophilus influenzae NOT DETECTED NOT DETECTED Final   Neisseria meningitidis NOT DETECTED NOT DETECTED Final   Pseudomonas aeruginosa NOT DETECTED NOT DETECTED Final   Stenotrophomonas maltophilia NOT DETECTED NOT DETECTED Final   Candida albicans NOT DETECTED NOT DETECTED Final   Candida auris NOT DETECTED NOT DETECTED Final   Candida glabrata NOT DETECTED NOT DETECTED Final   Candida krusei NOT DETECTED NOT DETECTED Final   Candida parapsilosis DETECTED (A) NOT DETECTED Final    Comment: CRITICAL RESULT CALLED TO, READ BACK BY AND VERIFIED WITH: PHARM D D.WOFFORD ON [id number] AT 1740 BY E.PARRISH    Candida tropicalis NOT DETECTED NOT DETECTED Final   Cryptococcus neoformans/gattii NOT DETECTED NOT DETECTED Final    Comment: Performed at [facility], [address] [place], [place] [address]  Antifungal AST 9 Drug Panel     Status: None (Preliminary result)    Collection Time: 07/09/21  8:17 AM  Result Value Ref Range Status   Organism ID, Yeast Preliminary report  Final    Comment: (NOTE) Specimen has been received and testing has been initiated. Performed At: [facility] [place], [place] [id number] Rush Farmer MD GY:[id number]    Amphotericin B MIC PENDING  Incomplete   Anidulafungin MIC PENDING  Incomplete   Caspofungin MIC PENDING  Incomplete   Micafungin MIC PENDING  Incomplete   Posaconazole MIC PENDING  Incomplete   Fluconazole Islt MIC PENDING  Incomplete   Flucytosine MIC PENDING  Incomplete   Itraconazole MIC PENDING  Incomplete   Voriconazole MIC PENDING  Incomplete   Source PENDING  Incomplete  Culture, blood (single)     Status: None (Preliminary result)   Collection Time: 07/11/21  3:50 PM   Specimen: BLOOD  Result Value Ref Range Status   Specimen Description   Final    BLOOD LEFT WRIST Performed at [facility] [address], [place], [place] [address]    Special Requests   Final    BOTTLES DRAWN AEROBIC AND ANAEROBIC Blood Culture adequate volume Performed at [facility] [address] [place], [place] [address]    Culture   Final    NO GROWTH 3 DAYS Performed at [facility], [place] [address], [place],  [address]    Report Status PENDING  Incomplete     Labs: Basic Metabolic Panel: Recent Labs  Lab 07/09/21 0358 07/10/21 0337 07/11/21 0300 07/11/21 0933 07/12/21 0606 07/13/21 0318 07/14/21 0819  NA 138 134* 138  --  136 135 139  K 3.6 4.1 3.4*  --  4.4 4.3 4.2  CL 107 109 102  --  110 112* 114*  CO2 23 21* 19*  --  20* 18* 19*  GLUCOSE 141* 148* 380*  --  116* 104* 98  BUN 41* 40* 30*  --  31* 30* 23*  CREATININE 1.57* 1.65* 1.46*  --  1.64* 1.60* 1.58*  CALCIUM 8.9 8.9 7.7*  --  9.2 9.4 9.7  MG 2.0 1.8 1.7  --  2.4 2.0  --   PHOS 2.7 1.8* 21.6* 2.9 3.1 3.9  --    Liver Function Tests: Recent Labs  Lab  07/10/21 0337 07/11/21 0300 07/12/21 0606 07/13/21 0318 07/14/21 0819  AST 281* 141* 72* 53* 42*  ALT 281* 215* 175* 147* 127*  ALKPHOS 211* 177* 190* 212* 264*  BILITOT 1.4* 1.9* 1.3* 1.2 0.9  PROT 5.2* 4.7* 5.1* 5.2* 5.6*  ALBUMIN 2.2* 1.9* 2.0* 1.9* 2.2*   No results for input(s): LIPASE, AMYLASE in the last 168 hours. Recent Labs  Lab 07/12/21 0606  AMMONIA 25   CBC: Recent Labs  Lab 07/09/21 0358 07/10/21 0337 07/10/21 1334 07/11/21 0300 07/11/21 1442 07/12/21 0606 07/13/21 0318  WBC 2.4* 2.9*  --  2.3*  --  3.5* 3.0*  NEUTROABS  --  2.3  --   --   --   --  1.2*  HGB 7.8* 7.5*  --  6.5* 8.0* 8.1* 8.6*  HCT 24.3* 23.0*  --  19.8* 24.3* 25.1* 26.1*  MCV 93.1 93.1  --  93.0  --  88.4 86.4  PLT 81* 65* 51* 37*  --  77* 93*   Cardiac Enzymes: Recent Labs  Lab 07/08/21 1820  CKTOTAL 16*   BNP: BNP (last 3 results) Recent Labs    05/06/21 2010  BNP 97.4    ProBNP (last 3 results) No results for input(s): PROBNP in the last 8760 hours.  CBG: Recent Labs  Lab 07/11/21 1546 07/11/21 2237  GLUCAP 120* 126*       Signed:  Nita Sells MD   [facility] 07/14/2021, 2:22 PM

## 2021-07-14 NOTE — Progress Notes (Signed)
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   Recent Labs    07/12/21 0606 07/13/21 0318 07/14/21 0819  NA 136 135 139  K 4.4 4.3 4.2  CL 110 112* 114*  CO2 20* 18* 19*  GLUCOSE 116* 104* 98  BUN 31* 30* 23*  CREATININE 1.64* 1.60* 1.58*  CALCIUM 9.2 9.4 9.7  PHOS 3.1 3.9  --   MG 2.4 2.0  --   ALBUMIN 2.0* 1.9* 2.2*  ALKPHOS 190* 212* 264*  AST 72* 53* 42*  ALT 175* 147* 127*  BILITOT 1.3* 1.2 0.9       Assessment: 60 year old female on chronic TPN followed by [facility] for short gut syndrome.  Patient is on cyclic TPN over 12 hours at home.  Of note, previous admission in June 2022- TPN was held for a short period of time due to elevated liver enzymes.  TPN resumed on 7/27 and d/ced on 7/28 d/t bacteremia then resumed TPN on 06/28/21. She was re-admitted 10/10 with low grade temp & TPN was held on admission. 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  Tbili decreased to 0.9 -  prealbumin 22.9 (7/27), 24.2 (8/3) - TG 88 (8/15) Intake / Output; MIVF:  I/O: = -292m, UOP 2475 mL GI Imaging: 8/10 CT A/P: Unchanged mild wall thickening of the descending colon, no additional areas seen, no SBO, no abscess 8/13 KUB no significant change, no obstructive changes GI Surgeries / Procedures: n/a Ordered GI Medications: Creon 36,000 units TID w/ meals,  PPI po qday, pepcid prn  Diet: Regular Oral  nutritional supplements: Prosouce plus 30 ml BID in between meals Central access: replace dual lumen tunneled femoral catheter on 8/19 TPN start date: on TPN PTA  Nutritional Goals (Dietician's recom 8/16): Kcal:  1800-2000 kcal, Protein:  90-105 grams, Fluid:  > 1.8 L day ++ Will dose inpatient TPN using dietitian's recom. for nutritional goals ++ Goal TPN rate is 75 mL/hr (provides 90 g of protein and 1818 kcals per day)  PTA TPN (see media tab for Rx image): total 1088 kCal (w/o Lipids) and 1688 kcal w/ lipids; 340 kcal AA, 748 kcal CHO -Formula w/o lipids 4 days/week- receives SMOFlipid 60g on MWF. Adds Infuvite 10 mL and Tralement 13mto each bag daily.  -Dextrose 70%= 220g/day; Plenamine 15% = 85g/day; Kphos 24 mmol/day; K acetate 57 mEq/day; Mg 10 mEq/day; CaGlu 10 mEq/day; Na Acetate 76 mEq/day - Na = 83 mEq/L  - K = 51 mEq/L - Ca= 5.5 mEq/L - Mag = 5.5 mEq/L - Phos = 13 mmol/L - Cl:Ac = 1:3   Plan:  - Restart TPN at goal rate 75 ml/hr, when central line placed  - Electrolytes in TPN: Na 5046mL, K 20 mEq/L, Ca 0 mEq/L, Mg 0 mEq/L, and Phos 0 mmol/L. Cl:Ac-->max acetate - Add standard MVI and trace elements to TPN  - No SSI  - MIVF: Off when TPN  starts - Continue to trend LFTs - transition to cyclic TPN when stable (electrolytes to normalize) - Monitor TPN labs on Mon/Thurs    Gretta Arab PharmD, BCPS Clinical Pharmacist [facility] main pharmacy [phone] 07/14/2021 12:16 PM

## 2021-07-14 NOTE — TOC Progression Note (Signed)
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 Line holiday until 8/19 until 8/31, antibiotics narrowed to Cipro until 8/23 per ID Eraxis-->fluconazole until 8/31 negative so far Blood culture 8/16 repeat is pending TTE negative for any infection Outpatient ophthalmology follow-up, lumbar imaging as per ID Short gut syndrome 2/2 prior multiple surgeries for Crohn's TPN held till PICC Can be replaced Resumed Gattex at patient request-patient is taking this on her own Continue Protonix 40 daily, Creon 36,000 3 times daily, Pepcid 20 as needed Carafate twice daily on hold Continue Entocort 3 mg every 3 days Chronic pain Continue Robaxin 500 3 times daily Dilaudid DC 8/17 home methadone 5 times a day 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 1 unit PRBC 8/16-good response Get iron studies as outpatient Hypokalemia/hypomagnesemia Continue D5/LR with 40 of K and adjust as needed May require addition bicarb TPN related cholestasis Moderate to severe thrombocytopenia Follow as outpatient TCP 2/2 infection--this seems to be resolving Patient is also on estradiol oral tablets which have been held May require outpatient ultrasound liver-patient has had TPN for many years Elevated D-dimer-lower extremity duplex ruled out DVT Monitor trends Hypotension with a history of hypertension Meds were adjusted-continue amlodipine 10 Coreg 6.25 twice daily, watch hydralazine 100 3 times daily Depression Outpatient follow-up-continue Cymbalta 60 daily, Wellbutrin 100 twice daily Seizure disorder Continue Keppra 500 twice daily  TOC PLAN OF CARE:  From home plan is to return to home, Progression: wbc-3.0,hgb 8.6, platelets  =93      Barriers to Discharge: Continued Medical Work up  Expected Discharge Plan and Services           Expected Discharge Date:  (unknown)                                     Social Determinants of Health (SDOH) Interventions    Readmission Risk Interventions Readmission Risk Prevention Plan 06/22/2021 02/10/2021 12/21/2020  Transportation Screening Complete Complete Complete  PCP or Specialist Appt within 3-5 Days - - -  Not Complete comments - - -  HRI or [place] - - -  Social Work Consult for [place] Planning/Counseling - - -  SW consult not completed comments - - -  Palliative Care Screening - - -  Medication Review [employer] photographer) Complete Complete -  PCP or Specialist appointment within 3-5 days of discharge Complete - Complete  HRI or Home Care Consult Complete Complete Complete  SW Recovery Care/Counseling Consult Complete Complete Complete  Palliative Care Screening Not Applicable Not Applicable -  [place] Not Applicable Not Applicable -

## 2021-07-14 NOTE — Progress Notes (Signed)
PHARMACY CONSULT NOTE FOR:  OUTPATIENT  PARENTERAL ANTIBIOTIC THERAPY (OPAT)  Indication: Blood stream infection Regimen: ciprofloxacin 467m IV BID AND fluconazole 2036mIV daily End date: ciprofloxacin 07/18/2021, fluconazole 07/26/2021  IV antibiotic discharge orders are pended. To discharging provider:  please sign these orders via discharge navigator,  Select New Orders & click on the button choice - Manage This Unsigned Work.     Thank you for allowing pharmacy to be a part of this patient's care.  Caitlin Peters/19/2022, 9:39 AM

## 2021-07-14 NOTE — Consult Note (Signed)
Patient Status: [facility] - In-pt  Assessment and Plan: Crohn's colitis on chronic TPN Patient in need of venous access to resume TPN.  Patient with long-term history of catheter placements, line infections, and difficult access.  Now using femoral access for tunneled catheters for TPN/IV Abx as needed.  Admitted with blood stream infection.  Tunneled catheter removed 8/16.  Blood cultures have been negative x3 days.  IR consulted for replacement.   Plan made to replace dual lumen tunneled femoral catheter with sedation today in IR as schedule allows.  Patient reports no PO intake today.  She is NPO.   Risks and benefits discussed with the patient including, but not limited to bleeding, infection, vascular injury, air embolism or even death  All of the patient's questions were answered, patient is agreeable to proceed. Consent signed and in chart.  ______________________________________________________________________   History of Present Illness: Caitlin Peters is a 60 y.o. female with past medical history of Crohn's colitis on chronic TPN with frequent blood stream infection and difficult IV access.  Admitted with bacteremia/candidemia, s/p removal of femoral tunneled catheter 8/16.  IR consulted for replacement.  Allergies and medications reviewed.   Review of Systems: A 12 point ROS discussed and pertinent positives are indicated in the HPI above.  All other systems are negative.  Review of Systems  Constitutional:  Negative for fatigue and fever.  Respiratory:  Negative for cough and shortness of breath.   Cardiovascular:  Negative for chest pain.  Gastrointestinal:  Negative for abdominal pain, nausea and vomiting.  Musculoskeletal:  Negative for back pain.  Psychiatric/Behavioral:  Negative for behavioral problems and confusion.    Vital Signs: BP (!) 176/74   Pulse 72   Temp (!) 97.4 F (36.3 C) (Axillary)   Resp 16   Ht 5' 8"  (1.727 m)   Wt 145 lb 11.6 oz (66.1  kg)   SpO2 97%   BMI 22.16 kg/m   Physical Exam Vitals and nursing note reviewed.  Constitutional:      Appearance: She is well-developed.  HENT:     Mouth/Throat:     Mouth: Mucous membranes are moist.     Pharynx: Oropharynx is clear.  Cardiovascular:     Rate and Rhythm: Normal rate and regular rhythm.  Pulmonary:     Effort: Pulmonary effort is normal.     Breath sounds: Normal breath sounds.  Skin:    General: Skin is warm and dry.  Neurological:     General: No focal deficit present.     Mental Status: She is alert and oriented to person, place, and time.  Psychiatric:        Mood and Affect: Mood normal.        Behavior: Behavior normal.     Imaging reviewed.   Labs:  COAGS: Recent Labs    11/06/20 2355 05/06/21 2010 06/19/21 2145 07/10/21 1334  INR 1.1 0.9 1.0 1.1  APTT 31 25 30  36    BMP: Recent Labs    08/10/20 0545 08/10/20 0800 08/11/20 0654 08/12/20 0513 08/30/20 2249 07/11/21 0300 07/12/21 0606 07/13/21 0318 07/14/21 0819  NA SPECIMEN CONTAMINATED, UNABLE TO PERFORM TEST(S). 138 139 137   < > 138 136 135 139  K SPECIMEN CONTAMINATED, UNABLE TO PERFORM TEST(S). 3.6 3.8 3.9   < > 3.4* 4.4 4.3 4.2  CL SPECIMEN CONTAMINATED, UNABLE TO PERFORM TEST(S). 109 109 111   < > 102 110 112* 114*  CO2 SPECIMEN CONTAMINATED, UNABLE TO PERFORM  TEST(S). 21* 21* 21*   < > 19* 20* 18* 19*  GLUCOSE SPECIMEN CONTAMINATED, UNABLE TO PERFORM TEST(S). 124* 116* 95   < > 380* 116* 104* 98  BUN SPECIMEN CONTAMINATED, UNABLE TO PERFORM TEST(S). 13 21* 31*   < > 30* 31* 30* 23*  CALCIUM SPECIMEN CONTAMINATED, UNABLE TO PERFORM TEST(S). 10.0 10.4* 9.9   < > 7.7* 9.2 9.4 9.7  CREATININE SPECIMEN CONTAMINATED, UNABLE TO PERFORM TEST(S). 1.40* 1.22* 1.25*   < > 1.46* 1.64* 1.60* 1.58*  GFRNONAA SPECIMEN CONTAMINATED, UNABLE TO PERFORM TEST(S). 41* 48* 47*   < > 41* 36* 37* 37*  GFRAA SPECIMEN CONTAMINATED, UNABLE TO PERFORM TEST(S). 48* 56* 55*  --   --   --   --   --     < > = values in this interval not displayed.       Electronically Signed: Docia Barrier, PA 07/14/2021, 11:30 AM   I spent a total of 15 minutes in face to face in clinical consultation, greater than 50% of which was counseling/coordinating care for venous access.

## 2021-07-14 NOTE — TOC Progression Note (Signed)
Transition of Care [facility]) - Progression Note    Patient Details  Name: Caitlin Peters MRN: [id number] Date of Birth: 1961/05/13  Transition of Care [facility]) CM/SW Contact  Purcell Mouton, RN Phone Number: 07/14/2021, 3:39 PM  Clinical Narrative:     [place] called for [facility], IV ABX and TPN orders. MD was made aware and orders are in. This CM is signing off.     Barriers to Discharge: Continued Medical Work up  Expected Discharge Plan and Services           Expected Discharge Date: 07/14/21                                     Social Determinants of Health (SDOH) Interventions    Readmission Risk Interventions Readmission Risk Prevention Plan 06/22/2021 02/10/2021 12/21/2020  Transportation Screening Complete Complete Complete  PCP or Specialist Appt within 3-5 Days - - -  Not Complete comments - - -  HRI or [place] Work Consult for [place] Planning/Counseling - - -  SW consult not completed comments - - -  Palliative Care Screening - - -  Medication Review Press photographer) Complete Complete -  PCP or Specialist appointment within 3-5 days of discharge Complete - Complete  HRI or Home Care Consult Complete Complete Complete  SW Recovery Care/Counseling Consult Complete Complete Complete  Palliative Care Screening Not Applicable Not Applicable -  [place] Not Applicable Not Applicable -

## 2021-07-14 NOTE — Procedures (Signed)
Interventional Radiology Procedure Note  Procedure: Right femoral tunneled DL Power Line.  Catheter tips at the cavoatrial junction and ready for use.    Complications: None  Estimated Blood Loss: None  Recommendations: - Routine line care   Signed,  Criselda Peaches, MD

## 2021-07-14 NOTE — Progress Notes (Signed)
Id brief note   Ct showed no lumbar spine abscess or new OM  -see id note from yesterday for final antibiotic plan Will sign off

## 2021-07-16 LAB — CULTURE, BLOOD (ROUTINE X 2)

## 2021-07-16 LAB — CULTURE, BLOOD (SINGLE)
Culture: NO GROWTH
Special Requests: ADEQUATE

## 2021-07-17 ENCOUNTER — Telehealth: Payer: Self-pay

## 2021-07-17 NOTE — Telephone Encounter (Signed)
Transition Care Management Unsuccessful Follow-up Telephone Call   Date of discharge and from where:  Caitlin Peters 07/14/2021 Attempts:  2nd Attempt  Reason for unsuccessful TCM follow-up call:  Unable to reach patient

## 2021-07-21 ENCOUNTER — Telehealth: Payer: Self-pay | Admitting: Internal Medicine

## 2021-07-21 NOTE — Telephone Encounter (Signed)
[name] from [facility] called needing verbal orders for OT for Caitlin Peters     Good callback number is [phone]    Please Advise

## 2021-07-21 NOTE — Telephone Encounter (Signed)
Verbal orders for OT given to [facility].

## 2021-07-25 ENCOUNTER — Other Ambulatory Visit: Payer: Self-pay

## 2021-07-26 ENCOUNTER — Encounter (HOSPITAL_COMMUNITY): Payer: Self-pay | Admitting: Emergency Medicine

## 2021-07-26 ENCOUNTER — Emergency Department (HOSPITAL_COMMUNITY)
Admission: EM | Admit: 2021-07-26 | Discharge: 2021-07-26 | Disposition: A | Discharge status: Home / Self Care | Payer: Medicare Other | Attending: Emergency Medicine | Admitting: Emergency Medicine

## 2021-07-26 ENCOUNTER — Emergency Department (HOSPITAL_COMMUNITY): Payer: Medicare Other

## 2021-07-26 ENCOUNTER — Other Ambulatory Visit: Payer: Self-pay

## 2021-07-26 ENCOUNTER — Ambulatory Visit: Payer: Medicare Other | Admitting: Internal Medicine

## 2021-07-26 DIAGNOSIS — Z79899 Other long term (current) drug therapy: Secondary | ICD-10-CM | POA: Insufficient documentation

## 2021-07-26 DIAGNOSIS — K219 Gastro-esophageal reflux disease without esophagitis: Secondary | ICD-10-CM | POA: Diagnosis not present

## 2021-07-26 DIAGNOSIS — Z20822 Contact with and (suspected) exposure to covid-19: Secondary | ICD-10-CM | POA: Diagnosis not present

## 2021-07-26 DIAGNOSIS — I129 Hypertensive chronic kidney disease with stage 1 through stage 4 chronic kidney disease, or unspecified chronic kidney disease: Secondary | ICD-10-CM | POA: Diagnosis not present

## 2021-07-26 DIAGNOSIS — K909 Intestinal malabsorption, unspecified: Secondary | ICD-10-CM | POA: Diagnosis not present

## 2021-07-26 DIAGNOSIS — Z8616 Personal history of COVID-19: Secondary | ICD-10-CM | POA: Insufficient documentation

## 2021-07-26 DIAGNOSIS — Z87891 Personal history of nicotine dependence: Secondary | ICD-10-CM | POA: Insufficient documentation

## 2021-07-26 DIAGNOSIS — K5090 Crohn's disease, unspecified, without complications: Secondary | ICD-10-CM | POA: Diagnosis not present

## 2021-07-26 DIAGNOSIS — R112 Nausea with vomiting, unspecified: Secondary | ICD-10-CM

## 2021-07-26 DIAGNOSIS — K912 Postsurgical malabsorption, not elsewhere classified: Secondary | ICD-10-CM

## 2021-07-26 DIAGNOSIS — R1013 Epigastric pain: Secondary | ICD-10-CM

## 2021-07-26 DIAGNOSIS — N183 Chronic kidney disease, stage 3 unspecified: Secondary | ICD-10-CM | POA: Insufficient documentation

## 2021-07-26 DIAGNOSIS — R109 Unspecified abdominal pain: Secondary | ICD-10-CM | POA: Diagnosis present

## 2021-07-26 LAB — URINALYSIS, ROUTINE W REFLEX MICROSCOPIC
Bilirubin Urine: NEGATIVE
Glucose, UA: NEGATIVE mg/dL
Hgb urine dipstick: NEGATIVE
Ketones, ur: NEGATIVE mg/dL
Leukocytes,Ua: NEGATIVE
Nitrite: NEGATIVE
Protein, ur: 100 mg/dL — AB
Specific Gravity, Urine: 1.015 (ref 1.005–1.030)
pH: 8 (ref 5.0–8.0)

## 2021-07-26 LAB — COMPREHENSIVE METABOLIC PANEL
ALT: 363 U/L — ABNORMAL HIGH (ref 0–44)
AST: 296 U/L — ABNORMAL HIGH (ref 15–41)
Albumin: 3 g/dL — ABNORMAL LOW (ref 3.5–5.0)
Alkaline Phosphatase: 234 U/L — ABNORMAL HIGH (ref 38–126)
Anion gap: 8 (ref 5–15)
BUN: 40 mg/dL — ABNORMAL HIGH (ref 6–20)
CO2: 23 mmol/L (ref 22–32)
Calcium: 10 mg/dL (ref 8.9–10.3)
Chloride: 109 mmol/L (ref 98–111)
Creatinine, Ser: 1.55 mg/dL — ABNORMAL HIGH (ref 0.44–1.00)
GFR, Estimated: 38 mL/min — ABNORMAL LOW (ref >60)
Glucose, Bld: 112 mg/dL — ABNORMAL HIGH (ref 70–99)
Potassium: 4.4 mmol/L (ref 3.5–5.1)
Sodium: 140 mmol/L (ref 135–145)
Total Bilirubin: 0.8 mg/dL (ref 0.3–1.2)
Total Protein: 7.1 g/dL (ref 6.5–8.1)

## 2021-07-26 LAB — CBC WITH DIFFERENTIAL/PLATELET
Abs Immature Granulocytes: 0.01 10*3/uL (ref 0.00–0.07)
Basophils Absolute: 0 10*3/uL (ref 0.0–0.1)
Basophils Relative: 0 %
Eosinophils Absolute: 0 10*3/uL (ref 0.0–0.5)
Eosinophils Relative: 1 %
HCT: 25.6 % — ABNORMAL LOW (ref 36.0–46.0)
Hemoglobin: 8.3 g/dL — ABNORMAL LOW (ref 12.0–15.0)
Immature Granulocytes: 0 %
Lymphocytes Relative: 38 %
Lymphs Abs: 1.1 10*3/uL (ref 0.7–4.0)
MCH: 28.4 pg (ref 26.0–34.0)
MCHC: 32.4 g/dL (ref 30.0–36.0)
MCV: 87.7 fL (ref 80.0–100.0)
Monocytes Absolute: 0.1 10*3/uL (ref 0.1–1.0)
Monocytes Relative: 5 %
Neutro Abs: 1.6 10*3/uL — ABNORMAL LOW (ref 1.7–7.7)
Neutrophils Relative %: 56 %
Platelets: 173 10*3/uL (ref 150–400)
RBC: 2.92 MIL/uL — ABNORMAL LOW (ref 3.87–5.11)
RDW: 17.5 % — ABNORMAL HIGH (ref 11.5–15.5)
WBC: 2.8 10*3/uL — ABNORMAL LOW (ref 4.0–10.5)
nRBC: 0 % (ref 0.0–0.2)

## 2021-07-26 LAB — RESP PANEL BY RT-PCR (FLU A&B, COVID) ARPGX2
Influenza A by PCR: NEGATIVE
Influenza B by PCR: NEGATIVE
SARS Coronavirus 2 by RT PCR: NEGATIVE

## 2021-07-26 LAB — URINALYSIS, MICROSCOPIC (REFLEX)
Bacteria, UA: NONE SEEN
RBC / HPF: NONE SEEN RBC/hpf (ref 0–5)
Squamous Epithelial / HPF: NONE SEEN (ref 0–5)

## 2021-07-26 LAB — LIPASE, BLOOD: Lipase: 40 U/L (ref 11–51)

## 2021-07-26 MED ORDER — LIDOCAINE VISCOUS HCL 2 % MT SOLN
15.0000 mL | Freq: Once | OROMUCOSAL | Status: AC
  Administered 2021-07-26: 15 mL via ORAL
  Filled 2021-07-26: qty 15

## 2021-07-26 MED ORDER — ALUM & MAG HYDROXIDE-SIMETH 200-200-20 MG/5ML PO SUSP
30.0000 mL | Freq: Once | ORAL | Status: AC
  Administered 2021-07-26: 30 mL via ORAL
  Filled 2021-07-26: qty 30

## 2021-07-26 MED ORDER — LACTATED RINGERS IV BOLUS
1000.0000 mL | Freq: Once | INTRAVENOUS | Status: AC
  Administered 2021-07-26: 1000 mL via INTRAVENOUS

## 2021-07-26 MED ORDER — IOHEXOL 9 MG/ML PO SOLN
500.0000 mL | ORAL | Status: AC
  Administered 2021-07-26: 500 mL via ORAL

## 2021-07-26 MED ORDER — HYDROMORPHONE HCL 1 MG/ML IJ SOLN
1.0000 mg | Freq: Once | INTRAMUSCULAR | Status: AC
Start: 2021-07-26 — End: 2021-07-26
  Administered 2021-07-26: 1 mg via INTRAVENOUS
  Filled 2021-07-26: qty 1

## 2021-07-26 MED ORDER — ONDANSETRON HCL 4 MG/2ML IJ SOLN
4.0000 mg | Freq: Once | INTRAMUSCULAR | Status: AC
  Administered 2021-07-26: 4 mg via INTRAVENOUS
  Filled 2021-07-26: qty 2

## 2021-07-26 MED ORDER — HYDROMORPHONE HCL 1 MG/ML IJ SOLN
1.0000 mg | Freq: Once | INTRAMUSCULAR | Status: AC
  Administered 2021-07-26: 1 mg via INTRAVENOUS
  Filled 2021-07-26: qty 1

## 2021-07-26 MED ORDER — IOHEXOL 9 MG/ML PO SOLN
ORAL | Status: AC
  Filled 2021-07-26: qty 1000

## 2021-07-26 MED ORDER — IOHEXOL 350 MG/ML SOLN
75.0000 mL | Freq: Once | INTRAVENOUS | Status: AC | PRN
  Administered 2021-07-26: 75 mL via INTRAVENOUS

## 2021-07-26 NOTE — ED Provider Notes (Signed)
[facility] DEPT Provider Note   CSN: [id number] Arrival date & time: 07/26/21  4097     History Chief Complaint  Patient presents with   Emesis    Caitlin Peters is a 60 y.o. female.   Emesis Associated symptoms: abdominal pain and fever   Associated symptoms: no arthralgias, no cough and no sore throat    60 year old female with a complex past medical history to include Crohn's disease status post colonic resection in 3532'D complicated by multiple small bowel resections between 2001 and 2009, now TPN dependent since 2003, short gut syndrome, recent hospitalization from 8/10 to 07/14/2021 for Candida infection with sepsis on admission.  The patient was found to have candidemia secondary to chronic femoral PICC line and underwent line holiday from 8/19 until 8/21.  She continues to take her prescribed oral ciprofloxacin.  She had a new femoral line replaced prior to discharge.  She continues taking Protonix 40 mg daily, Creon 3 times daily, Pepcid 20 mg as needed.  Events to the emergency department today with subjective fevers, abdominal discomfort, nausea and vomiting and an inability to tolerate any oral intake.  She states that she continues to administer TPN at home through her right femoral PICC line which was replaced while inpatient during her last hospitalization.  She endorses subjective fevers, diffuse fatigue, overall difficulty performing ADLs at home.  She has a home health nurse come by her house once a week.  She also has assistance from family members in the household.  She mostly self administers her own TPN.  She states that she has been so fatigued that she has not been able to get out of bed over the last few weeks since her discharge.  She cannot tolerate any oral intake with persistent nausea and vomiting and burning epigastric pain with any attempts at oral intake.  Past Medical History:  Diagnosis Date   Acute pancreatitis 04/13/2020    Anasarca 10/2019   AVN (avascular necrosis of bone) ([name])    Cataract    Choledocholithiasis (sludge) s/p ERCP 10/2019 10/21/2020   Chronic pain syndrome    CKD (chronic kidney disease), stage III ([place])    Crohn disease ([place])    Crohn's disease of small & large intestine with SGS 1984   Caitlin Peters is a 60 year old female with a history of Crohn's disease diagnosed in 79 (age 19), history of long-term steroid use with osteoporosis, S/P multiple bowel resections ([id number]) complicated by chronic back and abdominal pain, steatorrhea and short bowel syndrome. The patient has been left with ~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 attached to proximal transverse colon through rectum. She has been   Depression    Diverticulosis    GERD (gastroesophageal reflux disease)    HTN (hypertension)    IDA (iron deficiency anemia)    Malnutrition ([place])    Mass in chest    Osteoporosis    Osteoporosis 12/24/2014   Pancreatitis    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; 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; jejunal to transverse colon anastomosis Treated at [facility] [place] SB lengthening to 165cm Dr Alene Mires, [facility]   Vitamin B12 deficiency     Patient Active Problem List   Diagnosis Date Noted   Vertebral osteomyelitis ([place])    Bloodstream infection due to central venous catheter    Thrombocytopenia ([name])    Malnutrition of  moderate degree 07/06/2021   Transaminitis    Hypophosphatemia    Chronic idiopathic constipation    Left shoulder pain 05/23/2021   Elevated LFTs    Sepsis ([place]) 05/06/2021   Acidosis 04/08/2021   Colitis 04/07/2021   Intractable nausea and vomiting 03/11/2021   Thoracic discitis 02/10/2021   Pressure injury of skin 12/18/2020   Bacteremia 12/17/2020   Seizures ([place]) 12/17/2020   Drug-seeking behavior 12/16/2020   Seizure ([place]) 12/15/2020   CKD (chronic kidney disease) stage 3,  GFR 30-59 ml/min ([name]) 12/06/2020   COVID-19 virus infection 12/06/2020   Protein calorie malnutrition ([place]) 12/06/2020   Palpitations 11/22/2020   PAC (premature atrial contraction) 11/22/2020   History of central line-associated bloodstream infection ([facility]) 10/21/2020   Nausea & vomiting 10/21/2020   Choledocholithiasis (sludge) s/p ERCP 10/2019 10/21/2020   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   Abdominal pain 91/66/0600   Acute metabolic encephalopathy    Hypomagnesemia    Partial small bowel obstruction ([name])    Bacteremia associated with intravascular line ([place]) 08/04/2020   Enterobacter sepsis ([place]) 07/22/2020   Fever of unknown origin 06/27/2020   Anxiety 06/03/2020   Acute pancreatitis 04/13/2020   Infection due to Acinetobacter species 03/10/2020   Pancytopenia ([place]) 03/05/2020   Central line infection    Elevated liver enzymes 01/02/2020   Cholangitis    Anasarca 10/28/2019   Acute kidney injury superimposed on chronic kidney disease ([place]) 10/28/2019   Falls 10/28/2019   Malnutrition ([place])    Vitamin B12 deficiency    GERD (gastroesophageal reflux disease)    Chronic pain syndrome    Hypokalemia due to excessive gastrointestinal loss of potassium 10/13/2019   Fever    Hypokalemia 10/12/2019   Chronic diarrhea 10/12/2019   Dysuria 10/12/2019   Bilateral lower extremity edema 10/12/2019   Hypertension 10/12/2019   AKI (acute kidney injury) ([place]) 10/12/2019   Candidemia ([place]) 08/27/2019   IDA (iron deficiency anemia) 11/03/2018   Dilation of biliary tract 08/28/2018   Severe diarrhea 03/07/2018   LFTs abnormal 01/27/2018   GI tract obstruction ([place]) 01/27/2018   Gram-negative bacteremia 09/13/2017   HTN (hypertension), benign 12/02/2016   Intractable pain 12/02/2016   Anemia 12/02/2016   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   Avascular necrosis ([place]) 06/14/2015   Polyarthralgia 05/03/2015   On total parenteral nutrition (TPN) 12/30/2014   Osteoporosis  12/24/2014   Low back pain 12/16/2014   Vitamin D deficiency 12/16/2014   Short gut syndrome 07/15/2014   History of colonic diverticulitis 2014   Depression 07/24/2012   Gram-positive bacteremia 07/24/2012   Small bowel obstruction due to adhesions ([place]) 07/24/2012   Diarrhea 12/13/2011   Neuralgia and neuritis 06/01/2011   Myalgia and myositis 08/12/2003   Crohn's disease of both small and large intestine with other complication ([place]) 4599    Past Surgical History:  Procedure Laterality Date   ABDOMINAL ADHESION SURGERY  01/22/2018   APPENDECTOMY  1989   BILIARY DILATION  11/26/2019   Procedure: BILIARY DILATION;  Surgeon: Jackquline Denmark, MD;  Location: [facility] ENDOSCOPY;  Service: Endoscopy;;   BILIARY DILATION  03/08/2020   Procedure: BILIARY DILATION;  Surgeon: Irving Copas., MD;  Location: [facility] ENDOSCOPY;  Service: Gastroenterology;;   BIOPSY  03/08/2020   Procedure: BIOPSY;  Surgeon: Irving Copas., MD;  Location: [facility] END[facility]COPY;  Service: Gastroenterology;;   CHEST WALL RESECTION     right thoracotomy,resection of chest mass with anterior rib and reconstruction using prosthetic mesh and video arthroscopy   CHOLECYSTECTOMY  01/22/2018   COLONOSCOPY  2019   ENTEROSTOMY CLOSURE  04/1999   ERCP N/A 11/26/2019   Procedure: ENDOSCOPIC RETROGRADE CHOLANGIOPANCREATOGRAPHY (ERCP);  Surgeon: Jackquline Denmark, MD;  Location: [facility] END[facility];  Service: Endoscopy;  Laterality: N/A;   ERCP N/A 03/08/2020   Procedure: ENDOSCOPIC RETROGRADE CHOLANGIOPANCREATOGRAPHY (ERCP);  Surgeon: Irving Copas., MD;  Location: [facility];  Service: Gastroenterology;  Laterality: N/A;   ESOPHAGOGASTRODUODENOSCOPY N/A 03/08/2020   Procedure: ESOPHAGOGASTRODUODENOSCOPY (EGD);  Surgeon: Irving Copas., MD;  Location: [facility];  Service: Gastroenterology;  Laterality: N/A;   EUS N/A 03/08/2020   Procedure: UPPER ENDOSCOPIC ULTRASOUND (EUS) LINEAR;  Surgeon: Irving Copas., MD;   Location: [facility];  Service: Gastroenterology;  Laterality: N/A;   ILEOCECETOMY  03/1999   ileocolon resection with abdominal stoma   ILEOSTOMY CLOSURE  2001   IR FLUORO GUIDE CV LINE LEFT  01/07/2020   IR FLUORO GUIDE CV LINE LEFT  03/09/2020   IR FLUORO GUIDE CV LINE LEFT  05/09/2020   IR FLUORO GUIDE CV LINE LEFT  07/20/2020   IR FLUORO GUIDE CV LINE RIGHT  08/05/2020   IR FLUORO GUIDE CV LINE RIGHT  04/03/2021   IR PTA VENOUS EXCEPT DIALYSIS CIRCUIT  01/07/2020   IR REMOVAL TUN CV CATH W/O FL  08/05/2020   IR REMOVAL TUN CV CATH W/O FL  07/11/2021   IR US GUIDE VASC ACCESS LEFT     x 2 06/17/19 and 09/14/2019   IR US GUIDE VASC ACCESS RIGHT  08/05/2020   KNEE SURGERY     right knee    LAPAROSCOPIC SMALL BOWEL RESECTION  2009   2000-2009.  SB resections for Crohns Disease - now with Short gut   OMENTECTOMY  01/22/2018   PARTIAL HYSTERECTOMY  1984   with LSO   REMOVAL OF STONES  11/26/2019   Procedure: REMOVAL OF STONES;  Surgeon: Jackquline Denmark, MD;  Location: [facility] ENDOSCOPY;  Service: Endoscopy;;   REMOVAL OF STONES  03/08/2020   Procedure: REMOVAL OF STONES;  Surgeon: Irving Copas., MD;  Location[facility] ENDOSCOPY;  Service: Gastroenterology;;   SALPINGOOPHORECTOMY Left 1984   SALPINGOOPHORECTOMY Right 1990   SERIAL TRANSVERSE ENTEROPLASTY (STEP) - SMALL BOWEL LENGTHENING  01/22/2018   Dr Alene Mires, [facility] - SB length from 120 to 165cm    SMALL INTESTINE SURGERY  2002   SMALL INTESTINE SURGERY  2003   SPHINCTEROTOMY  11/26/2019   Procedure: SPHINCTEROTOMY;  Surgeon: Jackquline Denmark, MD;  Location: [facility] ENDOSCOPY;  Service: Endoscopy;;   TOTAL ABDOMINAL HYSTERECTOMY  1990   with RSO   UPPER GASTROINTESTINAL ENDOSCOPY       OB History   No obstetric history on file.     Family History  Problem Relation Age of Onset   Seizures Mother    Glaucoma Mother    CAD Father    Heart disease Father    Hypertension Father    Breast cancer Sister    Multiple sclerosis  Sister    Diabetes Sister    Lupus Sister    Colon cancer Other    Crohn's disease Other     Social History   Tobacco Use   Smoking status: Former   Smokeless tobacco: Never  Scientific laboratory technician Use: Never used  Substance Use Topics   Alcohol use: Not Currently   Drug use: Never    Home Medications Prior to Admission medications   Medication Sig Start Date End Date Taking? Authorizing Provider  amLODipine (  NORVASC) 10 MG tablet Take 10 mg by mouth daily.   Yes [provider]  budesonide (ENTOCORT EC) 3 MG 24 hr capsule Take 1 capsule (3 mg total) by mouth every 3 (three) days. 06/30/21  Yes Eugenie Filler, MD  buPROPion [facility] SR) 100 MG 12 hr tablet Take 200 mg by mouth daily. 03/02/21  Yes [provider]  Calcium 200 MG TABS Take 200 mg by mouth daily.   Yes [provider]  carvedilol (COREG) 6.25 MG tablet Take 1 tablet (6.25 mg total) by mouth 2 (two) times daily with a meal. 07/14/21  Yes Nita Sells, MD  cholecalciferol (VITAMIN D3) 25 MCG (1000 UT) tablet Take 1,000 Units by mouth daily.    Yes [provider]  cycloSPORINE (RESTASIS) 0.05 % ophthalmic emulsion Place 1 drop into both eyes 2 (two) times daily.   Yes [provider]  dexlansoprazole (DEXILANT) 60 MG capsule Take 1 capsule (60 mg total) by mouth daily. 12/28/20  Yes [name], [name], MD  DULoxetine (CYMBALTA) 60 MG capsule Take 2 capsules (120 mg total) by mouth daily. 06/30/21  Yes Eugenie Filler, MD  estradiol (ESTRACE) 2 MG tablet Take 1 tablet (2 mg total) by mouth daily. 12/07/20  Yes [name], [name], MD  famotidine (PEPCID) 20 MG tablet Take 20 mg by mouth daily as needed for heartburn or indigestion.   Yes [provider]  fluconazole (DIFLUCAN) IVPB Inject 200 mg into the vein daily for 12 days. Indication:  Candidemia  First Dose: No Last Day of Therapy:  07/26/2021 Labs - Once weekly:  CBC/D and BMP, Labs -  Every other week:  ESR and CRP Method of administration: Premix bag / Control-A-Flo (CAF) Method of administration may be changed at the discretion of the patient and/or caregiver's ability to self-administer the medication ordered. 07/14/21 07/26/21 Yes Samtani, Darylene Price, MD  GATTEX 5 MG KIT Inject 1.5 mg into the skin daily. 01/03/21  Yes [provider]  hydrALAZINE (APRESOLINE) 100 MG tablet Take 1 tablet (100 mg total) by mouth 3 (three) times daily. 06/30/21  Yes Eugenie Filler, MD  HYDROmorphone (DILAUDID) 4 MG tablet Take 4 mg by mouth 4 (four) times daily as needed for moderate pain or severe pain. 04/18/21  Yes [provider]  levETIRAcetam (KEPPRA) 500 MG tablet Take 500 mg by mouth 2 (two) times daily.   Yes [provider]  lidocaine (LIDODERM) 5 % Place 1 patch onto the skin daily. Remove & Discard patch within 12 hours or as directed by MD 06/30/21  Yes Eugenie Filler, MD  lipase/protease/amylase (CREON) 36000 UNITS CPEP capsule Take 1 capsule (36,000 Units total) by mouth See admin instructions. 36000 units with meals Patient taking differently: Take 36,000 Units by mouth 3 (three) times daily with meals. 06/30/21  Yes Eugenie Filler, MD  Menthol, Topical Analgesic, (BIOFREEZE EX) Apply 1 application topically as needed (pain).   Yes [provider]  methadone (DOLOPHINE) 5 MG tablet Take 1 tablet (5 mg total) by mouth 5 (five) times daily. 06/30/20  Yes Florencia Reasons, MD  methocarbamol (ROBAXIN) 500 MG tablet Take 1 tablet (500 mg total) by mouth in the morning and at bedtime. 06/13/21  Yes [name], [name], MD  Multiple Vitamins-Minerals (MULTIVITAMIN ADULT PO) Take 1 tablet by mouth daily.   Yes [provider]  NARCAN 4 MG/0.1ML LIQD nasal spray kit Place 1 spray into the nose once as needed (overdose). 10/14/20  Yes [provider]  polyvinyl alcohol (LIQUIFILM TEARS) 1.4 % ophthalmic solution Place 1 drop into both eyes  as needed for dry eyes.   Yes [provider]  PRESCRIPTION MEDICATION Inject 1 each into the vein daily. Home TPN . [facility] . 1 bag for 12 hours. [phone]   Yes [provider]  sodium chloride 0.9 % infusion Inject 1 mL into the vein daily as needed (flush). 10/16/20  Yes [provider]  sucralfate (CARAFATE) 1 GM/10ML suspension Take 10 mLs (1 g total) by mouth 2 (two) times daily. 06/30/21  Yes Eugenie Filler, MD  Trace Minerals Cu-Mn-Se-Zn (TRALEMENT IV) Inject 1 mL into the vein See admin instructions. Used in TPN bag 4 times weekly   Yes [provider]  vitamin B-12 (CYANOCOBALAMIN) 1000 MCG tablet Take 1,000 mcg by mouth daily.    Yes [provider]  feeding supplement (ENSURE ENLIVE / ENSURE PLUS) LIQD Take 237 mLs by mouth daily. Patient not taking: Reported on 07/26/2021 06/30/21   Eugenie Filler, MD  promethazine (PHENERGAN) 25 MG tablet Take 1 tablet (25 mg total) by mouth every 6 (six) hours as needed for nausea. 06/30/21   Eugenie Filler, MD    Allergies    Meperidine, Hyoscyamine, Cefepime, Gabapentin, Lyrica [pregabalin], Topamax [topiramate], Zosyn [piperacillin sod-tazobactam so], Fentanyl, and Morphine and related  Review of Systems   Review of Systems  Constitutional:  Positive for fatigue and fever.  HENT:  Negative for ear pain and sore throat.   Eyes:  Negative for pain and visual disturbance.  Respiratory:  Negative for cough and shortness of breath.   Cardiovascular:  Negative for chest pain and palpitations.  Gastrointestinal:  Positive for abdominal pain, nausea and vomiting.  Genitourinary:  Negative for dysuria and hematuria.  Musculoskeletal:  Negative for arthralgias and back pain.  Skin:  Negative for color change and rash.  Neurological:  Negative for seizures and syncope.  All other systems reviewed and are negative.  Physical Exam Updated Vital Signs BP (!)  162/71   Pulse 86   Temp 98.6 F (37 C) (Oral)   Resp 16   SpO2 99%   Physical Exam Vitals and nursing note reviewed.  Constitutional:      General: She is not in acute distress.    Appearance: She is well-developed. She is ill-appearing.  HENT:     Head: Normocephalic and atraumatic.  Eyes:     Conjunctiva/sclera: Conjunctivae normal.  Cardiovascular:     Rate and Rhythm: Normal rate and regular rhythm.     Pulses: Normal pulses.     Heart sounds: No murmur heard. Pulmonary:     Effort: Pulmonary effort is normal. No respiratory distress.     Breath sounds: Normal breath sounds.  Abdominal:     General: Abdomen is flat.     Palpations: Abdomen is soft.     Tenderness: There is abdominal tenderness in the epigastric area. There is guarding. There is no rebound.  Musculoskeletal:     Cervical back: Neck supple.     Comments: Right femoral PICC line in place with no surrounding erythema or drainage  Skin:    General: Skin is warm and dry.  Neurological:     Mental Status: She is alert.    ED Results / Procedures / Treatments   Labs (all labs ordered are listed, but only abnormal results are displayed) Labs Reviewed  CBC WITH DIFFERENTIAL/PLATELET -  Abnormal; Notable for the following components:      Result Value   WBC 2.8 (*)    RBC 2.92 (*)    Hemoglobin 8.3 (*)    HCT 25.6 (*)    RDW 17.5 (*)    Neutro Abs 1.6 (*)    All other components within normal limits  COMPREHENSIVE METABOLIC PANEL - Abnormal; Notable for the following components:   Glucose, Bld 112 (*)    BUN 40 (*)    Creatinine, Ser 1.55 (*)    Albumin 3.0 (*)    AST 296 (*)    ALT 363 (*)    Alkaline Phosphatase 234 (*)    GFR, Estimated 38 (*)    All other components within normal limits  URINALYSIS, ROUTINE W REFLEX MICROSCOPIC - Abnormal; Notable for the following components:   Protein, ur 100 (*)    All other components within normal limits  RESP PANEL BY RT-PCR (FLU A&B, COVID) [id number]   LIPASE, BLOOD  URINALYSIS, MICROSCOPIC (REFLEX)    EKG EKG Interpretation  Date/Time:  Wednesday July 26 2021 09:23:34 EDT Ventricular Rate:  87 PR Interval:  186 QRS Duration: 82 QT Interval:  354 QTC Calculation: 426 R Axis:   74 Text Interpretation: Sinus rhythm Consider left ventricular hypertrophy Confirmed by Regan Lemming ([id number]) on 07/26/2021 1:31:17 PM  Radiology CT Abdomen Pelvis W Contrast  Result Date: 07/26/2021 CLINICAL DATA:  History of Crohn's disease with abdominal pain, initial encounter EXAM: CT ABDOMEN AND PELVIS WITH CONTRAST TECHNIQUE: Multidetector CT imaging of the abdomen and pelvis was performed using the standard protocol following bolus administration of intravenous contrast. CONTRAST:  19m OMNIPAQUE IOHEXOL 350 MG/ML SOLN COMPARISON:  07/05/2021 FINDINGS: Lower chest: No acute abnormality. Hepatobiliary: No focal liver abnormality is seen. Status post cholecystectomy. No biliary dilatation. Pancreas: Unremarkable. No pancreatic ductal dilatation or surrounding inflammatory changes. Spleen: Normal in size without focal abnormality. Adrenals/Urinary Tract: Adrenal glands are within normal limits. Kidneys demonstrate a normal enhancement pattern bilaterally. No renal calculi are seen. No obstructive changes noted. The bladder is partially distended. Stomach/Bowel: Postsurgical changes are noted in the small bowel consistent with prior small bowel and right colon surgery. The descending colon remains predominately decompressed with mild hyperemia similar to that seen on the prior exam. Stomach and residual small bowel appear within normal limits. No obstructive changes are noted. Vascular/Lymphatic: Central venous catheter via the right femoral approach is noted and stable. Mild aortic atherosclerotic changes are seen. No significant lymphadenopathy is noted. Reproductive: Status post hysterectomy. No adnexal masses. Other: No abdominal wall hernia or abnormality. No  abdominopelvic ascites. Musculoskeletal: Degenerative changes of the lumbar spine are noted. IMPRESSION: Persistent mild wall thickening in the descending colon stable from the prior exam. No findings to suggest pericolonic abscess are noted. Postsurgical changes similar to that noted on the prior exam. No other focal abnormality noted. Electronically Signed   By: MInez CatalinaM.D.   On: 07/26/2021 12:23    Procedures Procedures   Medications Ordered in ED Medications  iohexol (OMNIPAQUE) 9 MG/ML oral solution 500 mL ( Oral Canceled Entry 07/26/21 1112)  iohexol (OMNIPAQUE) 9 MG/ML oral solution (  Canceled Entry 07/26/21 0955)  ondansetron (ZOFRAN) injection 4 mg (4 mg Intravenous Given 07/26/21 0919)  HYDROmorphone (DILAUDID) injection 1 mg (1 mg Intravenous Given 07/26/21 0919)  lactated ringers bolus 1,000 mL (0 mLs Intravenous Stopped 07/26/21 1502)  HYDROmorphone (DILAUDID) injection 1 mg (1 mg Intravenous Given 07/26/21 1117)  ondansetron (ZOFRAN) injection 4  mg (4 mg Intravenous Given 07/26/21 1117)  iohexol (OMNIPAQUE) 350 MG/ML injection 75 mL (75 mLs Intravenous Contrast Given 07/26/21 1120)  HYDROmorphone (DILAUDID) injection 1 mg (1 mg Intravenous Given 07/26/21 1345)  alum & mag hydroxide-simeth (MAALOX/MYLANTA) 200-200-20 MG/5ML suspension 30 mL (30 mLs Oral Given 07/26/21 1345)    And  lidocaine (XYLOCAINE) 2 % viscous mouth solution 15 mL (15 mLs Oral Given 07/26/21 1345)    ED Course  I have reviewed the triage vital signs and the nursing notes.  Pertinent labs & imaging results that were available during my care of the patient were reviewed by me and considered in my medical decision making (see chart for details).    MDM Rules/Calculators/A&P                           60 year old female with a complex past medical history to include Crohn's disease status post colonic resection in 1610'R complicated by multiple small bowel resections between 2001 and 2009, now TPN dependent  since 2003, short gut syndrome, recent hospitalization from 8/10 to 07/14/2021 for Candida infection with sepsis on admission.  The patient was found to have candidemia secondary to chronic femoral PICC line and underwent line holiday from 8/19 until 8/21.  She continues to take her prescribed oral ciprofloxacin.  She had a new femoral line replaced prior to discharge.  She continues taking Protonix 40 mg daily, Creon 3 times daily, Pepcid 20 mg as needed.  Events to the emergency department today with subjective fevers, abdominal discomfort, nausea and vomiting and an inability to tolerate any oral intake.  She states that she continues to administer TPN at home through her right femoral PICC line which was replaced while inpatient during her last hospitalization.  She endorses subjective fevers, diffuse fatigue, overall difficulty performing ADLs at home.  She has a home health nurse come by her house once a week.  She also has assistance from family members in the household.  She mostly self administers her own TPN.  She states that she has been so fatigued that she has not been able to get out of bed over the last few weeks since her discharge.  She cannot tolerate any oral intake with persistent nausea and vomiting and burning epigastric pain with any attempts at oral intake.  On arrival, the patient was afebrile, not tachycardic or tachypneic, hypertensive BP 168/82, saturating well on room air.  Physical exam significant for mild epigastric tenderness to palpation, no rebound or guarding.  PICC line site in place with no surrounding erythema or drainage.  The patient does not meet SIRS criteria and is overall well-appearing.  Will obtain screening labs, administer IV antiemetics and pain medication, IV fluids, obtain CT abdomen pelvis and reassess.  Differential diagnosis: Acute on chronic abdominal pain, Crohn's flare, worsening colitis, abscess or fistula formation, viral illness pancreatitis  appendicitis bowel obstruction, peptic ulcer disease.  Labs obtained significant for COVID-19 and influenza PCR negative, urinalysis without evidence of urinary tract infection, lipase normal, CMP with normal electrolytes, BUN and creatinine at baseline, LFTs at baseline, CBC with decreased WBC and hemoglobin but at baseline.  CT abdomen pelvis performed with persistent mild wall thickening in the descending colon stable from the prior exam, no findings to suggest pericolonic abscess, postsurgical changes similar to prior, no other focal abnormality noted.  The patient describes burning epigastric pain consistent with potential peptic ulcer disease.  She has no other findings identified  on exam.  She is already currently on an outpatient treatment regimen for ulcer prophylaxis.  I spoke with gastroenterology on-call and relayed the findings and presentation of the patient to them.  They state that there is likely no indication for emergent scope or inpatient admission at this time.  Patient appears to be at baseline.  Hospitalist medicine communicated with and also agree.  At this time.  Patient was deemed to be stable for outpatient follow-up.  Social work contacted to arrange for additional home health services to help the patient.  Plan for follow-up with the patient's gastroenterologist at [facility].   Final Clinical Impression(s) / ED Diagnoses Final diagnoses:  Epigastric pain  Nausea and vomiting, intractability of vomiting not specified, unspecified vomiting type  Short gut syndrome  Crohn's disease without complication, unspecified gastrointestinal tract location [facility])    Rx / DC Orders ED Discharge Orders     None        Regan Lemming, MD 07/26/21 1946

## 2021-07-26 NOTE — ED Notes (Signed)
Patient gown and purewick changed due to leaking

## 2021-07-26 NOTE — Discharge Instructions (Addendum)
Please follow-up with your gastroenterologist at [facility].   You were evaluated in the Emergency Department and after careful evaluation, we did not find any emergent condition requiring admission or further testing in the hospital.  Your exam/testing today was overall reassuring.  Please return to the Emergency Department if you experience any worsening of your condition.  Thank you for allowing [place] to be a part of your care.

## 2021-07-26 NOTE — ED Triage Notes (Addendum)
Patient BIBA from home c/o n/v and fever x2 weeks. Reports being discharged recently from the hospital for similar complaint. Hx HTN, Chron's, CKD, on TPN. EMS reports patient afebrile and VS WDL.

## 2021-07-27 ENCOUNTER — Other Ambulatory Visit: Payer: Self-pay | Admitting: Internal Medicine

## 2021-07-31 ENCOUNTER — Other Ambulatory Visit: Payer: Self-pay | Admitting: Internal Medicine

## 2021-07-31 DIAGNOSIS — K219 Gastro-esophageal reflux disease without esophagitis: Secondary | ICD-10-CM

## 2021-08-01 ENCOUNTER — Encounter: Payer: Self-pay | Admitting: Internal Medicine

## 2021-08-01 ENCOUNTER — Other Ambulatory Visit: Payer: Self-pay

## 2021-08-01 ENCOUNTER — Telehealth: Payer: Self-pay | Admitting: Pharmacist

## 2021-08-01 ENCOUNTER — Ambulatory Visit (INDEPENDENT_AMBULATORY_CARE_PROVIDER_SITE_OTHER): Payer: Medicare Other | Admitting: Internal Medicine

## 2021-08-01 DIAGNOSIS — B377 Candidal sepsis: Secondary | ICD-10-CM | POA: Diagnosis not present

## 2021-08-01 DIAGNOSIS — R7401 Elevation of levels of liver transaminase levels: Secondary | ICD-10-CM

## 2021-08-01 DIAGNOSIS — Z8619 Personal history of other infectious and parasitic diseases: Secondary | ICD-10-CM | POA: Diagnosis not present

## 2021-08-01 NOTE — Progress Notes (Signed)
Virtual Visit via Telephone Note  I connected with Caitlin Peters on 08/01/21 at  3:30 PM EDT by telephone and verified that I am speaking with the correct person using two identifiers.  Location: Patient: at home Provider: in clinic   I discussed the limitations, risks, security and privacy concerns of performing an evaluation and management service by telephone and the availability of in person appointments. I also discussed with the patient that there may be a patient responsible charge related to this service. The patient expressed understanding and agreed to proceed.   History of Present Illness: Since I last saw her, she was hospitalized for candida parapsilosis CLABSI. She was treated with 2 wks of IV fluconazole,  from 8/17 till 8/31. She has tolerated without difficulty. She continues on TPN. TEE was negative. No new signs of discitis. She did not have eye exam but denies any visual difficulties  She states that she spoke with her GI doctor, Dr Domenica Fail, this morning who is also following LFT abn. Thought to be due in part due to TPN  +nauseated but not vomiting, poor sleep, no back pain, but intermittent abdominal pain-tolerated with pain medications. Denies eye pain or any blurry vision. No jaundice.   Observations/Objective: Fluent speech  Assessment and Plan: - transaminitis = patient following up with her GI doctor who is aware of abn lab to work up. - hx of clabsi = finished course of treatment. Will not need extention of abtx   Follow Up Instructions: rtc in 3 months for follow up    I discussed the assessment and treatment plan with the patient. The patient was provided an opportunity to ask questions and all were answered. The patient agreed with the plan and demonstrated an understanding of the instructions.   The patient was advised to call back or seek an in-person evaluation if the symptoms worsen or if the condition fails to improve as anticipated.  I provided 10  minutes of non-face-to-face time during this encounter.   Carlyle Basques, MD

## 2021-08-01 NOTE — Telephone Encounter (Signed)
Patient's labs from 9/1 resulted with elevated AST 466 and ALT 662. These are higher than discharge LFTs ( AST 296 and ALT 363). Passing along for guidance during appointment this afternoon.  Thanks!  Alfonse Spruce, PharmD, CPP Clinical Pharmacist Practitioner [facility]

## 2021-08-02 ENCOUNTER — Other Ambulatory Visit: Payer: Self-pay

## 2021-08-02 ENCOUNTER — Telehealth: Payer: Self-pay | Admitting: Pharmacist

## 2021-08-02 ENCOUNTER — Telehealth (INDEPENDENT_AMBULATORY_CARE_PROVIDER_SITE_OTHER): Payer: Medicare Other | Admitting: Internal Medicine

## 2021-08-02 ENCOUNTER — Encounter: Payer: Self-pay | Admitting: Internal Medicine

## 2021-08-02 VITALS — Wt 125.0 lb

## 2021-08-02 DIAGNOSIS — A419 Sepsis, unspecified organism: Secondary | ICD-10-CM | POA: Diagnosis not present

## 2021-08-02 DIAGNOSIS — Z09 Encounter for follow-up examination after completed treatment for conditions other than malignant neoplasm: Secondary | ICD-10-CM

## 2021-08-02 DIAGNOSIS — K7200 Acute and subacute hepatic failure without coma: Secondary | ICD-10-CM

## 2021-08-02 DIAGNOSIS — T80211D Bloodstream infection due to central venous catheter, subsequent encounter: Secondary | ICD-10-CM | POA: Diagnosis not present

## 2021-08-02 DIAGNOSIS — E43 Unspecified severe protein-calorie malnutrition: Secondary | ICD-10-CM

## 2021-08-02 DIAGNOSIS — K50819 Crohn's disease of both small and large intestine with unspecified complications: Secondary | ICD-10-CM

## 2021-08-02 DIAGNOSIS — F1120 Opioid dependence, uncomplicated: Secondary | ICD-10-CM

## 2021-08-02 DIAGNOSIS — R6520 Severe sepsis without septic shock: Secondary | ICD-10-CM

## 2021-08-02 DIAGNOSIS — R7989 Other specified abnormal findings of blood chemistry: Secondary | ICD-10-CM

## 2021-08-02 NOTE — Progress Notes (Signed)
Virtual Visit via Telephone Note  I connected with [name] on 08/02/21 at  8:00 AM EDT by telephone and verified that I am speaking with the correct person using two identifiers.   I discussed the limitations, risks, security and privacy concerns of performing an evaluation and management service by telephone and the availability of in person appointments. I also discussed with the patient that there may be a patient responsible charge related to this service. The patient expressed understanding and agreed to proceed.  Location patient: home Location provider: work office Participants present for the call: patient, provider Patient did not have a visit in the prior 7 days to address this/these issue(s).   History of Present Illness:  Caitlin Peters has scheduled this visit to update me on her most recent hospitalizations.  She has a history of Crohn's disease with multiple abdominal resections with resultant short gut syndrome who most recently has been in and out of the hospital for recurrent PICC line infections.  Her most recent hospitalization was from August 10 through August 19.  At this time she was diagnosed with Candida sepsis.  She has completed fluconazole.  She was seen again in the emergency department on August 31 for abdominal pain and fever but was discharged home after CT scan did not show acute changes.  She has been having some hepatic cholestasis presumably due to her TPN.  She had a phone consultation with her gastroenterologist at [facility], Dr. Domenica Fail.  He has recommended 2 options.  Option #1 would be continue the current plan with multiple hospitalizations for line infections until at some point her body will not be able to tolerate it, option #2 would be a small bowel transplant.  Patient has elected to get more information about this.  She will be traveling up to [place] for this consultation within the next few months.  She feels very weak and drained.  She is  currently doing home health therapy.  She is having trouble getting her medication refills through her pain management clinic.   Observations/Objective: Patient sounds fatigued. I do not appreciate any increased work of breathing. Speech and thought processing are grossly intact. Patient reported vitals: None reported   Current Outpatient Medications:    amLODipine (NORVASC) 10 MG tablet, Take 10 mg by mouth daily., Disp: , Rfl:    budesonide (ENTOCORT EC) 3 MG 24 hr capsule, Take 1 capsule (3 mg total) by mouth every 3 (three) days., Disp: , Rfl:    buPROPion (WELLBUTRIN SR) 100 MG 12 hr tablet, Take 200 mg by mouth daily., Disp: , Rfl:    Calcium 200 MG TABS, Take 200 mg by mouth daily., Disp: , Rfl:    carvedilol (COREG) 6.25 MG tablet, Take 1 tablet (6.25 mg total) by mouth 2 (two) times daily with a meal., Disp: 60 tablet, Rfl: 2   cholecalciferol (VITAMIN D3) 25 MCG (1000 UT) tablet, Take 1,000 Units by mouth daily. , Disp: , Rfl:    cycloSPORINE (RESTASIS) 0.05 % ophthalmic emulsion, Place 1 drop into both eyes 2 (two) times daily., Disp: , Rfl:    DEXILANT 60 MG capsule, Take 1 capsule by mouth once daily, Disp: 90 capsule, Rfl: 0   DULoxetine (CYMBALTA) 60 MG capsule, Take 2 capsules (120 mg total) by mouth daily., Disp: 60 capsule, Rfl: 1   estradiol (ESTRACE) 2 MG tablet, Take 1 tablet by mouth once daily, Disp: 90 tablet, Rfl: 0   famotidine (PEPCID) 20 MG  tablet, Take 20 mg by mouth daily as needed for heartburn or indigestion., Disp: , Rfl:    feeding supplement (ENSURE ENLIVE / ENSURE PLUS) LIQD, Take 237 mLs by mouth daily., Disp: 237 mL, Rfl: 12   GATTEX 5 MG KIT, Inject 1.5 mg into the skin daily., Disp: , Rfl:    hydrALAZINE (APRESOLINE) 100 MG tablet, Take 1 tablet (100 mg total) by mouth 3 (three) times daily., Disp: 90 tablet, Rfl: 1   HYDROmorphone (DILAUDID) 4 MG tablet, Take 4 mg by mouth 4 (four) times daily as needed for moderate pain or severe pain., Disp: , Rfl:     levETIRAcetam (KEPPRA) 500 MG tablet, Take 500 mg by mouth 2 (two) times daily., Disp: , Rfl:    lidocaine (LIDODERM) 5 %, Place 1 patch onto the skin daily. Remove & Discard patch within 12 hours or as directed by MD, Disp: 14 patch, Rfl: 0   lipase/protease/amylase (CREON) 36000 UNITS CPEP capsule, Take 1 capsule (36,000 Units total) by mouth See admin instructions. 36000 units with meals (Patient taking differently: Take 36,000 Units by mouth 3 (three) times daily with meals.), Disp: , Rfl:    Menthol, Topical Analgesic, (BIOFREEZE EX), Apply 1 application topically as needed (pain)., Disp: , Rfl:    methadone (DOLOPHINE) 5 MG tablet, Take 1 tablet (5 mg total) by mouth 5 (five) times daily., Disp: 30 tablet, Rfl: 0   methocarbamol (ROBAXIN) 500 MG tablet, Take 1 tablet (500 mg total) by mouth in the morning and at bedtime., Disp: 60 tablet, Rfl: 1   Multiple Vitamins-Minerals (MULTIVITAMIN ADULT PO), Take 1 tablet by mouth daily., Disp: , Rfl:    NARCAN 4 MG/0.1ML LIQD nasal spray kit, Place 1 spray into the nose once as needed (overdose)., Disp: , Rfl:    polyvinyl alcohol (LIQUIFILM TEARS) 1.4 % ophthalmic solution, Place 1 drop into both eyes as needed for dry eyes., Disp: , Rfl:    PRESCRIPTION MEDICATION, Inject 1 each into the vein daily. Home TPN . [facility] [place] . 1 bag for 12 hours. [phone], Disp: , Rfl:    promethazine (PHENERGAN) 25 MG tablet, Take 1 tablet (25 mg total) by mouth every 6 (six) hours as needed for nausea., Disp: , Rfl:    sodium chloride 0.9 % infusion, Inject 1 mL into the vein daily as needed (flush)., Disp: , Rfl:    sucralfate (CARAFATE) 1 GM/10ML suspension, Take 10 mLs (1 g total) by mouth 2 (two) times daily., Disp: 420 mL, Rfl: 2   Trace Minerals Cu-Mn-Se-Zn (TRALEMENT IV), Inject 1 mL into the vein See admin instructions. Used in TPN bag 4 times weekly, Disp: , Rfl:    vitamin B-12 (CYANOCOBALAMIN) 1000 MCG tablet, Take 1,000 mcg  by mouth daily. , Disp: , Rfl:   Review of Systems:  Constitutional: Denies fever, chills, diaphoresis, appetite change. HEENT: Denies photophobia, eye pain, redness, hearing loss, ear pain, congestion, sore throat, rhinorrhea, sneezing, mouth sores, trouble swallowing, neck pain, neck stiffness and tinnitus.   Respiratory: Denies SOB, DOE, cough, chest tightness,  and wheezing.   Cardiovascular: Denies chest pain, palpitations and leg swelling.  Gastrointestinal: Denies nausea, vomiting,  constipation, blood in stool and abdominal distention.  Genitourinary: Denies dysuria, urgency, frequency, hematuria, flank pain and difficulty urinating.  Endocrine: Denies: hot or cold intolerance, sweats, changes in hair or nails, polyuria, polydipsia. Musculoskeletal: Denies myalgias, back pain, joint swelling, arthralgias and gait problem.  Skin: Denies pallor, rash and wound.  Neurological: Denies dizziness, seizures, syncope, weakness, light-headedness, numbness and headaches.  Hematological: Denies adenopathy. 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  I agree with Verdis Frederickson that getting a consultation in regards to the small bowel transplant for a good option.  She is in the process of making this happen.  She will contact her pain management physician for refills.  I discussed the assessment and treatment plan with the patient. The patient was  provided an opportunity to ask questions and all were answered. The patient agreed with the plan and demonstrated an understanding of the instructions.   The patient was advised to call back or seek an in-person evaluation if the symptoms worsen or if the condition fails to improve as anticipated.  I provided 30 minutes of non-face-to-face time during this encounter.   [name], MD [facility] at [facility]

## 2021-08-02 NOTE — Chronic Care Management (AMB) (Signed)
Chronic Care Management Pharmacy Assistant   Name: Caitlin Peters  MRN: [id number] DOB: Apr 14, 1961   08-02-2021 APPOINTMENT REMINDER   Called Caitlin Peters, No answer, left message of appointment on 08-03-2021 at 1 pm via telephone visit with [name], Pharm D. Patient advised that would no longer be a good time for her as she had an outside appointment at 11 am on tomorrow, Offered her a new appointment date she accepted.  Care Gaps: None  Star Rating Drug: None  Any gaps in medications fill history? None     Medications: Outpatient Encounter Medications as of 08/02/2021  Medication Sig   amLODipine (NORVASC) 10 MG tablet Take 10 mg by mouth daily.   budesonide (ENTOCORT EC) 3 MG 24 hr capsule Take 1 capsule (3 mg total) by mouth every 3 (three) days.   buPROPion (WELLBUTRIN SR) 100 MG 12 hr tablet Take 200 mg by mouth daily.   Calcium 200 MG TABS Take 200 mg by mouth daily.   carvedilol (COREG) 6.25 MG tablet Take 1 tablet (6.25 mg total) by mouth 2 (two) times daily with a meal.   cholecalciferol (VITAMIN D3) 25 MCG (1000 UT) tablet Take 1,000 Units by mouth daily.    cycloSPORINE (RESTASIS) 0.05 % ophthalmic emulsion Place 1 drop into both eyes 2 (two) times daily.   DEXILANT 60 MG capsule Take 1 capsule by mouth once daily   DULoxetine (CYMBALTA) 60 MG capsule Take 2 capsules (120 mg total) by mouth daily.   estradiol (ESTRACE) 2 MG tablet Take 1 tablet by mouth once daily   famotidine (PEPCID) 20 MG tablet Take 20 mg by mouth daily as needed for heartburn or indigestion.   feeding supplement (ENSURE ENLIVE / ENSURE PLUS) LIQD Take 237 mLs by mouth daily.   GATTEX 5 MG KIT Inject 1.5 mg into the skin daily.   hydrALAZINE (APRESOLINE) 100 MG tablet Take 1 tablet (100 mg total) by mouth 3 (three) times daily.   HYDROmorphone (DILAUDID) 4 MG tablet Take 4 mg by mouth 4 (four) times daily as needed for moderate pain or severe pain.   levETIRAcetam (KEPPRA) 500 MG  tablet Take 500 mg by mouth 2 (two) times daily.   lidocaine (LIDODERM) 5 % Place 1 patch onto the skin daily. Remove & Discard patch within 12 hours or as directed by MD   lipase/protease/amylase (CREON) 36000 UNITS CPEP capsule Take 1 capsule (36,000 Units total) by mouth See admin instructions. 36000 units with meals (Patient taking differently: Take 36,000 Units by mouth 3 (three) times daily with meals.)   Menthol, Topical Analgesic, (BIOFREEZE EX) Apply 1 application topically as needed (pain).   methadone (DOLOPHINE) 5 MG tablet Take 1 tablet (5 mg total) by mouth 5 (five) times daily.   methocarbamol (ROBAXIN) 500 MG tablet Take 1 tablet (500 mg total) by mouth in the morning and at bedtime.   Multiple Vitamins-Minerals (MULTIVITAMIN ADULT PO) Take 1 tablet by mouth daily.   NARCAN 4 MG/0.1ML LIQD nasal spray kit Place 1 spray into the nose once as needed (overdose).   polyvinyl alcohol (LIQUIFILM TEARS) 1.4 % ophthalmic solution Place 1 drop into both eyes as needed for dry eyes.   PRESCRIPTION MEDICATION Inject 1 each into the vein daily. Home TPN . [facility] [place] . 1 bag for 12 hours. [phone]   promethazine (PHENERGAN) 25 MG tablet Take 1 tablet (25 mg total) by mouth every 6 (six) hours as needed for  nausea.   sodium chloride 0.9 % infusion Inject 1 mL into the vein daily as needed (flush).   sucralfate (CARAFATE) 1 GM/10ML suspension Take 10 mLs (1 g total) by mouth 2 (two) times daily.   Trace Minerals Cu-Mn-Se-Zn (TRALEMENT IV) Inject 1 mL into the vein See admin instructions. Used in TPN bag 4 times weekly   vitamin B-12 (CYANOCOBALAMIN) 1000 MCG tablet Take 1,000 mcg by mouth daily.    No facility-administered encounter medications on file as of 08/02/2021.   [name] CMA Clinical Pharmacist Assistant [phone]  

## 2021-08-03 ENCOUNTER — Telehealth: Payer: Medicare Other

## 2021-08-10 ENCOUNTER — Telehealth: Payer: Self-pay | Admitting: Internal Medicine

## 2021-08-10 DIAGNOSIS — H9191 Unspecified hearing loss, right ear: Secondary | ICD-10-CM

## 2021-08-10 NOTE — Addendum Note (Signed)
Addended by: Westley Hummer B on: 08/10/2021 05:07 PM   Modules accepted: Orders

## 2021-08-10 NOTE — Telephone Encounter (Signed)
Referral placed.

## 2021-08-10 NOTE — Telephone Encounter (Signed)
PT called to inquire about getting a referral for a Ear Dr as she is having trouble hearing out of her right ear. Please advise.

## 2021-08-16 NOTE — Telephone Encounter (Signed)
mistake

## 2021-08-19 ENCOUNTER — Other Ambulatory Visit: Payer: Self-pay | Admitting: Internal Medicine

## 2021-08-19 DIAGNOSIS — K912 Postsurgical malabsorption, not elsewhere classified: Secondary | ICD-10-CM

## 2021-08-23 ENCOUNTER — Other Ambulatory Visit: Payer: Self-pay | Admitting: Internal Medicine

## 2021-08-24 ENCOUNTER — Other Ambulatory Visit: Payer: Self-pay | Admitting: Internal Medicine

## 2021-08-24 DIAGNOSIS — K912 Postsurgical malabsorption, not elsewhere classified: Secondary | ICD-10-CM

## 2021-08-24 MED ORDER — PROMETHAZINE HCL 25 MG PO TABS: 25.0000 mg | ORAL_TABLET | Freq: Four times a day (QID) | ORAL | 1 refills | Status: DC | PRN

## 2021-08-24 NOTE — Telephone Encounter (Signed)
Patient called because she is needing a refill on promethazine (PHENERGAN) 25 MG tablet for her nausea    Please Send to  [place], [place]. Phone:  [phone]  Fax:  [phone]         Good callback number is [phone]     Please Advise

## 2021-08-25 NOTE — Telephone Encounter (Signed)
Rx sent 

## 2021-08-28 ENCOUNTER — Ambulatory Visit (INDEPENDENT_AMBULATORY_CARE_PROVIDER_SITE_OTHER): Payer: Medicare Other | Admitting: Neurology

## 2021-08-28 ENCOUNTER — Encounter: Payer: Self-pay | Admitting: Neurology

## 2021-08-28 ENCOUNTER — Other Ambulatory Visit: Payer: Self-pay

## 2021-08-28 VITALS — BP 90/50 | HR 70 | Ht 68.0 in | Wt 131.2 lb

## 2021-08-28 DIAGNOSIS — G40009 Localization-related (focal) (partial) idiopathic epilepsy and epileptic syndromes with seizures of localized onset, not intractable, without status epilepticus: Secondary | ICD-10-CM | POA: Diagnosis not present

## 2021-08-28 MED ORDER — LEVETIRACETAM 500 MG PO TABS: 500.0000 mg | ORAL_TABLET | Freq: Two times a day (BID) | ORAL | 11 refills | Status: AC

## 2021-08-28 NOTE — Progress Notes (Signed)
NEUROLOGY FOLLOW UP OFFICE NOTE  Caitlin Peters [id number] 1961/07/25  HISTORY OF PRESENT ILLNESS: I had the pleasure of seeing Caitlin Peters in follow-up in the neurology clinic on 08/28/2021.  The patient was last seen 6 months ago for new onset seizure that occurred in January 2022. MRI brain without contrast which I personally reviewed, no acute changes, there was mild to moderate white matter changes, likely related to chronic microvascular disease. Her routine EEG showed intermittent generalized slowing. Her daughter had also reported staring spells. She has been taking Levetiracetam 516m BID since then and denies any further seizures since 11/2020. She lives with her daughter who has not mentioned any staring spells. She denies any loss of time, olfactory/gustatory hallucinations, myoclonic jerks. She is very sleepy in the office today, falling asleep when not stimulated. BP is 90/50, she states BP usually runs high. She had taken her BP medications and pain medications this morning. She manages her own medications and denies any difficulties with this. She does not feel confused. She is a little dizzy. She states she is sleepy because she did not sleep well last night. She feels sleepy on a daily basis, but not as bad as today. She fell yesterday while playing with her dog, no head injuries. She was noted to have elevated LFTs on hospital admission in 06/2021, she states her infusion team checks her LFTs regularly and it has started going down. No labwork available for review on [facility]. She states she is due for bloodwork on Thursday. She reports numbness in her hands and feet, she has to shake them a lot. She does not drive.  Laboratory Data:  Lab Results  Component Value Date   WBC 2.8 (L) 07/26/2021   HGB 8.3 (L) 07/26/2021   HCT 25.6 (L) 07/26/2021   MCV 87.7 07/26/2021   PLT 173 07/26/2021     Chemistry      Component Value Date/Time   NA 140 07/26/2021 0901   K 4.4 07/26/2021  0901   CL 109 07/26/2021 0901   CO2 23 07/26/2021 0901   BUN 40 (H) 07/26/2021 0901   CREATININE 1.55 (H) 07/26/2021 0901   GLU 111 04/07/2021 0000      Component Value Date/Time   CALCIUM 10.0 07/26/2021 0901   ALKPHOS 234 (H) 07/26/2021 0901   AST 296 (H) 07/26/2021 0901   ALT 363 (H) 07/26/2021 0901   BILITOT 0.8 07/26/2021 0901     History on Initial Assessment 02/24/2021: This is a 60year old right-handed woman with a history of stage III chronic kidney disease, Crohn's disease, 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 She is alone in the office today, hospital records were reviewed. Her daughter reported she was sleeping on the couch when she made a low-pitched hum then had upper body/torso shaking, foaming at the mouth for 1-2 minutes. She was tight and stiff after the shaking, unresponsive to verbal stimulation. She was confused after, unable to tell her what state she was in (she moved to [place] a year ago). Her daughter also reported an episode 20 years ago while laying in bed with her mother, she felt full body shaking. Daughter also reported frequent staring spells, around 1/week where she would become unresponsive, stare off into space despite attempts to get her attention. She is sometimes incontinent of urine when she is "confused because of the spell." She had been admitted to the hospital 10 days  prior with COVID-19 with mild symptoms. She had an MRI brain without contrast which I personally reviewed, no acute changes, there was mild to moderate white matter changes, likely related to chronic microvascular disease, stable from 2019 imaging. Her routine EEG showed intermittent generalized slowing. She was discharged home on Levetiracetam 56m BID, no side effects. Wellbutrin was discontinued.  She denies any further seizures since 12/15/20. She denies any staring/unresponsive episodes, gaps in time, myoclonic jerks.  Rarely she would taste metal, but notes she is having nose bleeds. She feels weaker on her right side. She has had paresthesias in her arms and legs for many years, she has numbness in the heel of her foot and sometimes there is cramping in her toes and her thumb gets stiff. Her hands swell and go numb. She has chronic back pain due to Crohn's disease, and had some neck soreness with distended veins in her neck and chest from ports over the years. She gets around 4-5 hours of sleep, due to pain or need to use the bathroom. Her balance is off, last fall was a year ago. She does not drive, she lives with her daughter and grandchildren. She has a PICC line and is currently administering her own antibiotics in the office, she was in the hospital 2 weeks ago for discitis/osteomyelitis from T2-5, paravertebral and ventral epidural phlegmon.  Epilepsy Risk Factors:  Her mother had seizures later in life. She had a normal birth and early development.  There is no history of febrile convulsions, CNS infections such as meningitis/encephalitis, significant traumatic brain injury, neurosurgical procedures.    PAST MEDICAL HISTORY: Past Medical History:  Diagnosis Date   Acute pancreatitis 04/13/2020   Anasarca 10/2019   AVN (avascular necrosis of bone) ([name])    Cataract    Choledocholithiasis (sludge) s/p ERCP 10/2019 10/21/2020   Chronic pain syndrome    CKD (chronic [place] disease), stage III [place]    [place]    Crohn's disease of small & large intestine with SGS 1984   Caitlin TACKETTis a 60year old female with a history of Crohn's disease diagnosed in 158(age 60, history of long-term steroid use with osteoporosis, S/P multiple bowel resections ([id number] complicated by chronic back and abdominal pain, steatorrhea and short bowel syndrome. The patient has been left with ~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 attached to proximal transverse colon through rectum. She has been   Depression    Diverticulosis     GERD (gastroesophageal reflux disease)    HTN (hypertension)    IDA (iron deficiency anemia)    Mal[place]    Mass in chest    Osteoporosis    Osteoporosis 12/24/2014   Pancreatitis    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; 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; jejunal to transverse colon anastomosis [facility] 165cm Dr OAlene Mires CPantegodeficiency     MEDICATIONS: Current Outpatient Medications on File Prior to Visit  Medication Sig Dispense Refill   amLODipine (NORVASC) 10 MG tablet Take 1 tablet by mouth once daily 90 tablet 0   budesonide (ENTOCORT EC) 3 MG 24 hr capsule Take 1 capsule (3 mg total) by mouth every 3 (three) days.     buPROPion (WELLBUTRIN SR) 100 MG 12 hr tablet Take 200 mg by mouth daily.     Calcium 200  MG TABS Take 200 mg by mouth daily.     carvedilol (COREG) 6.25 MG tablet Take 1 tablet (6.25 mg total) by mouth 2 (two) times daily with a meal. 60 tablet 2   cholecalciferol (VITAMIN D3) 25 MCG (1000 UT) tablet Take 1,000 Units by mouth daily.      cycloSPORINE (RESTASIS) 0.05 % ophthalmic emulsion Place 1 drop into both eyes 2 (two) times daily.     DEXILANT 60 MG capsule Take 1 capsule by mouth once daily 90 capsule 0   DULoxetine (CYMBALTA) 60 MG capsule Take 2 capsules (120 mg total) by mouth daily. 60 capsule 1   estradiol (ESTRACE) 2 MG tablet Take 1 tablet by mouth once daily 90 tablet 0   famotidine (PEPCID) 20 MG tablet Take 20 mg by mouth daily as needed for heartburn or indigestion.     feeding supplement (ENSURE ENLIVE / ENSURE PLUS) LIQD Take 237 mLs by mouth daily. 237 mL 12   GATTEX 5 MG KIT Inject 1.5 mg into the skin daily.     hydrALAZINE (APRESOLINE) 100 MG tablet Take 1 tablet (100 mg total) by mouth 3 (three) times daily. 90 tablet 1   HYDROmorphone (DILAUDID) 4 MG tablet Take 4 mg by  mouth 4 (four) times daily as needed for moderate pain or severe pain.     levETIRAcetam (KEPPRA) 500 MG tablet Take 1 tablet by mouth twice daily 60 tablet 0   lidocaine (LIDODERM) 5 % Place 1 patch onto the skin daily. Remove & Discard patch within 12 hours or as directed by MD 14 patch 0   lipase/protease/amylase (CREON) 36000 UNITS CPEP capsule Take 1 capsule (36,000 Units total) by mouth See admin instructions. 36000 units with meals (Patient taking differently: Take 36,000 Units by mouth 3 (three) times daily with meals.)     Menthol, Topical Analgesic, (BIOFREEZE EX) Apply 1 application topically as needed (pain).     methadone (DOLOPHINE) 5 MG tablet Take 1 tablet (5 mg total) by mouth 5 (five) times daily. 30 tablet 0   methocarbamol (ROBAXIN) 500 MG tablet Take 1 tablet (500 mg total) by mouth in the morning and at bedtime. 60 tablet 1   Multiple Vitamins-Minerals (MULTIVITAMIN ADULT PO) Take 1 tablet by mouth daily.     NARCAN 4 MG/0.1ML LIQD nasal spray kit Place 1 spray into the nose once as needed (overdose).     polyvinyl alcohol (LIQUIFILM TEARS) 1.4 % ophthalmic solution Place 1 drop into both eyes as needed for dry eyes.     PRESCRIPTION MEDICATION Inject 1 each into the vein daily. Home TPN . [facility] . 1 bag for 12 hours. [phone]     promethazine (PHENERGAN) 25 MG tablet TAKE 1 TABLET BY MOUTH EVERY 6 HOURS AS NEEDED FOR NAUSEA OR VOMITING 90 tablet 0   sodium chloride 0.9 % infusion Inject 1 mL into the vein daily as needed (flush).     sucralfate (CARAFATE) 1 GM/10ML suspension Take 10 mLs (1 g total) by mouth 2 (two) times daily. 420 mL 2   Trace Minerals Cu-Mn-Se-Zn (TRALEMENT IV) Inject 1 mL into the vein See admin instructions. Used in TPN bag 4 times weekly     vitamin B-12 (CYANOCOBALAMIN) 1000 MCG tablet Take 1,000 mcg by mouth daily.      No current facility-administered medications on file prior to visit.    ALLERGIES: Allergies   Allergen Reactions   Meperidine Hives    Other  reaction(s): GI Upset Due to Chrones    Hyoscyamine Hives and Swelling    Legs swelling Disorientation   Cefepime Other (See Comments)    Neurotoxicity occurring in setting of AKI. Ceftriaxone tolerated during same admit   Gabapentin Other (See Comments)    unknown   Lyrica [Pregabalin] Other (See Comments)    unknown   Topamax [Topiramate] Other (See Comments)    unknown   Zosyn [Piperacillin Sod-Tazobactam So] Other (See Comments)    Patient reports it makes her vomit, her neck stiff, and her "heart feel funny"   Fentanyl Rash    Pt is allergic to fentanyl patch related to the glue (gives her a rash) Pt states she is NOT allergic to fentanyl IV medicine   Morphine And Related Rash    FAMILY HISTORY: Family History  Problem Relation Age of Onset   Seizures Mother    Glaucoma Mother    CAD Father    Heart disease Father    Hypertension Father    Breast cancer Sister    Multiple sclerosis Sister    Diabetes Sister    Lupus Sister    Colon cancer Other    Crohn's disease Other     SOCIAL HISTORY: Social History   Socioeconomic History   Marital status: Single    Spouse name: Not on file   Number of children: Not on file   Years of education: Not on file   Highest education level: Not on file  Occupational History   Occupation: disabled  Tobacco Use   Smoking status: Former   Smokeless tobacco: Never  Scientific laboratory technician Use: Never used  Substance and Sexual Activity   Alcohol use: Not Currently   Drug use: Never   Sexual activity: Yes  Other Topics Concern   Not on file  Social History Narrative   Right handed   One story apartment   No caffeine   Social Determinants of Health   Financial Resource Strain: Not on file  Food Insecurity: Not on file  Transportation Needs: Not on file  Physical Activity: Not on file  Stress: Not on file  Social Connections: Not on file  Intimate Partner Violence: Not  on file     PHYSICAL EXAM: Vitals:   08/28/21 1352 08/28/21 1426  BP: (!) 92/59 (!) 90/50  Pulse: 70   SpO2: 98%    General: No acute distress, drowsy, easily arousable but would fall asleep when not stimulated Head:  Normocephalic/atraumatic Skin/Extremities: No rash, no edema Neurological Exam: alert and awake. No aphasia or dysarthria. Fund of knowledge is appropriate.  Attention and concentration are  reduced.   Cranial nerves: Pupils equal, round. Extraocular movements intact with no nystagmus. Visual fields full.  No facial asymmetry.  Motor: Bulk and tone normal, muscle strength 5/5 throughout with no pronator drift.   Finger to nose testing intact.  Gait narrow-based and steady, no ataxia. No asterixis.    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 MRI brain no acute changes, EEG no epileptiform discharges. She is alone again in the office today, her daughter had reported staring spells while she was admitted in the hospital, she denies any staring/unresponsive episodes. Continue Levetiracetam 530m BID. She is very sleepy on today's visit, she is easily arousable to answer questions and follows commands  but falls asleep when not stimulated. No asterixis on exam. Recommended continued monitoring of LFTs with her GI specialist, she states she will have bloodwork on Thursday. She does not drive. She reports numbness in her hands and feet, we discussed that she is already on too many medications, would not add on another at this point. She may try using a wrist brace for hand numbness. Follow-up in 6-8 months, call for any changes.    Thank you for allowing me to participate in her care.  Please do not hesitate to call for any questions or concerns.  Ellouise Newer, M.D.   CC: Dr. Deniece Ree,  Dr. Baxter Flattery, Dr. Domenica Fail

## 2021-08-28 NOTE — Patient Instructions (Signed)
Continue Keppra 525m twice a day  2. Follow-up on liver function tests with your GI specialist  3. Follow-up in 6-8 months, call for any changes  Seizure Precautions: 1. If medication has been prescribed for you to prevent seizures, take it exactly as directed.  Do not stop taking the medicine without talking to your doctor first, even if you have not had a seizure in a long time.   2. Avoid activities in which a seizure would cause danger to yourself or to others.  Don't operate dangerous machinery, swim alone, or climb in high or dangerous places, such as on ladders, roofs, or girders.  Do not drive unless your doctor says you may.  3. If you have any warning that you may have a seizure, lay down in a safe place where you can't hurt yourself.    4.  No driving for 6 months from last seizure, [facility]   Please refer to the following link on the E[place]okawebsite for more information: [email or web address]   5.  Maintain good sleep hygiene. Avoid alcohol.  6.  Contact your doctor if you have any problems that may be related to the medicine you are taking.  7.  Call 911 and bring the patient back to the ED if:        A.  The seizure lasts longer than 5 minutes.       B.  The patient doesn't awaken shortly after the seizure  C.  The patient has new problems such as difficulty seeing, speaking or moving  D.  The patient was injured during the seizure  E.  The patient has a temperature over 102 F (39C)  F.  The patient vomited and now is having trouble breathing

## 2021-08-30 ENCOUNTER — Other Ambulatory Visit: Payer: Self-pay | Admitting: Internal Medicine

## 2021-09-13 ENCOUNTER — Telehealth: Payer: Self-pay | Admitting: Internal Medicine

## 2021-09-13 NOTE — Telephone Encounter (Signed)
Caitlin Peters needs verbal for PICC line and lab for next week 1x1 effective on 09-18-2021

## 2021-09-14 NOTE — Telephone Encounter (Signed)
Verbal orders given to [facility].

## 2021-09-15 ENCOUNTER — Ambulatory Visit (INDEPENDENT_AMBULATORY_CARE_PROVIDER_SITE_OTHER): Payer: Medicare Other | Admitting: Internal Medicine

## 2021-09-15 ENCOUNTER — Other Ambulatory Visit: Payer: Self-pay

## 2021-09-15 ENCOUNTER — Encounter: Payer: Self-pay | Admitting: Internal Medicine

## 2021-09-15 VITALS — BP 160/80 | HR 72 | Temp 98.6°F | Wt 136.2 lb

## 2021-09-15 DIAGNOSIS — R609 Edema, unspecified: Secondary | ICD-10-CM | POA: Diagnosis not present

## 2021-09-15 DIAGNOSIS — K50819 Crohn's disease of both small and large intestine with unspecified complications: Secondary | ICD-10-CM

## 2021-09-15 DIAGNOSIS — G894 Chronic pain syndrome: Secondary | ICD-10-CM | POA: Diagnosis not present

## 2021-09-15 DIAGNOSIS — Z23 Encounter for immunization: Secondary | ICD-10-CM

## 2021-09-15 MED ORDER — DULOXETINE HCL 60 MG PO CPEP: 120.0000 mg | ORAL_CAPSULE | Freq: Every day | ORAL | 1 refills | Status: DC

## 2021-09-15 NOTE — Progress Notes (Signed)
Established Patient Office Visit     This visit occurred during the SARS-CoV-2 public health emergency.  Safety protocols were in place, including screening questions prior to the visit, additional usage of staff PPE, and extensive cleaning of exam room while observing appropriate contact time as indicated for disinfecting solutions.    CC/Reason for Visit: Follow-up chronic conditions, discuss edema  HPI: Caitlin Peters is a 60 y.o. female who is coming in today for the above mentioned reasons. Past Medical History is significant for: Crohn's disease of the small bowel with short gut syndrome and multiple episodes of bacteremia, septicemia.  Most recently was hospitalized in August with candidemia.  It is taking her a long time to recover from this.  She has a specialized GI physician at [facility] who has recommended that she investigate the possibility of a small bowel transplant at [facility].  She tells me since I last saw her she has visited a nephrologist, kidney function is stable at around 1.5-1.6.  She visited her neurologist who advised continuation of [place] for the seizure that she had in January 2022.  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  She continues to have lower extremity edema and tenseness of her abdomen.  She appears to be overall in good spirits.  She had requested an ENT referral back in September for hearing loss, she has not yet heard back from them.  I can see where on our end the referral was clearly placed on September 16.   Past Medical/Surgical History: Past Medical History:  Diagnosis Date   Acute pancreatitis 04/13/2020   Anasarca 10/2019   AVN (avascular necrosis of bone) ([name])    Cataract    Choledocholithiasis (sludge) s/p ERCP 10/2019 10/21/2020   Chronic pain syndrome    CKD (chronic kidney disease), stage III ([place])     Crohn disease ([place])    Crohn's disease of small & large intestine with SGS 1984   Caitlin Peters is a 60 year old female with a history of Crohn's disease diagnosed in 70 (age 31), history of long-term steroid use with osteoporosis, S/P multiple bowel resections ([id number]) complicated by chronic back and abdominal pain, steatorrhea and short bowel syndrome. The patient has been left with ~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 attached to proximal transverse colon through rectum. 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; 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; jejunal to transverse colon anastomosis Treated at [facility] SB lengthening to 165cm Dr Alene Mires, [facility]   Vitamin B12 deficiency     Past Surgical History:  Procedure Laterality Date   ABDOMINAL ADHESION SURGERY  01/22/2018   [place]  11/26/2019   Procedure: BILIARY DILATION;  Surgeon: Jackquline Denmark, MD;  Location: [facility];  Service: Endoscopy;;   BILIARY DILATION  03/08/2020   Procedure: BILIARY DILATION;  Surgeon: Irving Copas., MD;  Location: [place];  Service: Gastroenterology;;   BIOPSY  03/08/2020   Procedure: BIOPSY;  Surgeon: Irving Copas., MD;  Location: [place];  Service: Gastroenterology;;   CHEST WALL RESECTION     right thoracotomy,resection of chest mass with anterior rib and reconstruction using prosthetic mesh and video arthroscopy   CHOLECYSTECTOMY  01/22/2018   COLONOSCOPY  2019   ENTEROSTOMY CLOSURE  04/1999   ERCP N/A 11/26/2019   Procedure: ENDOSCOPIC RETROGRADE CHOLANGIOPANCREATOGRAPHY (ERCP);  Surgeon: Jackquline Denmark, MD;   Location: [facility];  Service: Endoscopy;  Laterality: N/A;   ERCP N/A 03/08/2020   Procedure: ENDOSCOPIC RETROGRADE CHOLANGIOPANCREATOGRAPHY (ERCP);  Surgeon: Irving Copas., MD;  Location: [place];  Service: Gastroenterology;  Laterality: N/A;   ESOPHAGOGASTRODUODENOSCOPY N/A 03/08/2020   Procedure: ESOPHAGOGASTRODUODENOSCOPY (EGD);  Surgeon: Irving Copas., MD;  Location: [place];  Service: Gastroenterology;  Laterality: N/A;   EUS N/A 03/08/2020   Procedure: UPPER ENDOSCOPIC ULTRASOUND (EUS) LINEAR;  Surgeon: Irving Copas., MD;  Location: [place];  Service: Gastroenterology;  Laterality: N/A;   ILEOCECETOMY  03/1999   ileocolon resection with abdominal stoma   ILEOSTOMY CLOSURE  2001   IR FLUORO GUIDE CV LINE LEFT  01/07/2020   IR FLUORO GUIDE CV LINE LEFT  03/09/2020   IR FLUORO GUIDE CV LINE LEFT  05/09/2020   IR FLUORO GUIDE CV LINE LEFT  07/20/2020   IR FLUORO GUIDE CV LINE RIGHT  08/05/2020   IR FLUORO GUIDE CV LINE RIGHT  04/03/2021   IR PTA VENOUS EXCEPT DIALYSIS CIRCUIT  01/07/2020   IR REMOVAL TUN CV CATH W/O FL  08/05/2020   IR REMOVAL TUN CV CATH W/O FL  07/11/2021   IR US GUIDE VASC ACCESS LEFT     x 2 06/17/19 and 09/14/2019   IR US GUIDE VASC ACCESS RIGHT  08/05/2020   KNEE SURGERY     right knee    LAPAROSCOPIC SMALL BOWEL RESECTION  2009   2000-2009.  SB resections for Crohns Disease - now with Short gut   OMENTECTOMY  01/22/2018   PARTIAL HYSTERECTOMY  1984   with LSO   REMOVAL OF STONES  11/26/2019   Procedure: REMOVAL OF STONES;  Surgeon: Jackquline Denmark, MD;  Location: [facility];  Service: Endoscopy;;   REMOVAL OF STONES  03/08/2020   Procedure: REMOVAL OF STONES;  Surgeon: Irving Copas., MD;  [facility];  Service: Gastroenterology;;   SALPINGOOPHORECTOMY Left 1984   SALPINGOOPHORECTOMY Right 1990   SERIAL TRANSVERSE ENTEROPLASTY (STEP) - SMALL BOWEL LENGTHENING  01/22/2018   Dr Alene Mires, [facility]  - SB length from 120 to 165cm    SMALL INTESTINE SURGERY  2002   SMALL INTESTINE SURGERY  2003   SPHINCTEROTOMY  11/26/2019   Procedure: SPHINCTEROTOMY;  Surgeon: Jackquline Denmark, MD;  Location: [facility];  Service: Endoscopy;;   TOTAL ABDOMINAL HYSTERECTOMY  1990   with RSO   UPPER GASTROINTESTINAL ENDOSCOPY      Social History:  reports that she has quit smoking. She has never used smokeless tobacco. She reports that she does not currently use alcohol. She reports that she does not use drugs.  Allergies: Allergies  Allergen Reactions   Meperidine Hives    Other reaction(s): GI Upset Due to [place]    Hyoscyamine Hives and Swelling    Legs swelling Disorientation   Cefepime Other (See Comments)    Neurotoxicity occurring in setting of AKI. Ceftriaxone tolerated during same admit   Gabapentin Other (See Comments)    unknown   Lyrica [Pregabalin] Other (  See Comments)    unknown   Topamax [Topiramate] Other (See Comments)    unknown   Zosyn [Piperacillin Sod-Tazobactam So] Other (See Comments)    Patient reports it makes her vomit, her neck stiff, and her "heart feel funny"   Fentanyl Rash    Pt is allergic to fentanyl patch related to the glue (gives her a rash) Pt states she is NOT allergic to fentanyl IV medicine   Morphine And Related Rash    Family History:  Family History  Problem Relation Age of Onset   Seizures Mother    Glaucoma Mother    CAD Father    Heart disease Father    Hypertension Father    Breast cancer Sister    Multiple sclerosis Sister    Diabetes Sister    Lupus Sister    Colon cancer Other    Crohn's disease Other      Current Outpatient Medications:    amLODipine (NORVASC) 10 MG tablet, Take 1 tablet by mouth once daily, Disp: 90 tablet, Rfl: 0   budesonide (ENTOCORT EC) 3 MG 24 hr capsule, Take 1 capsule (3 mg total) by mouth every 3 (three) days., Disp: , Rfl:    buPROPion ER (WELLBUTRIN SR) 100 MG 12 hr tablet, Take 2 tablets by  mouth once daily, Disp: 60 tablet, Rfl: 2   Calcium 200 MG TABS, Take 200 mg by mouth daily., Disp: , Rfl:    carvedilol (COREG) 6.25 MG tablet, Take 1 tablet (6.25 mg total) by mouth 2 (two) times daily with a meal., Disp: 60 tablet, Rfl: 2   cholecalciferol (VITAMIN D3) 25 MCG (1000 UT) tablet, Take 1,000 Units by mouth daily. , Disp: , Rfl:    cycloSPORINE (RESTASIS) 0.05 % ophthalmic emulsion, Place 1 drop into both eyes 2 (two) times daily., Disp: , Rfl:    DEXILANT 60 MG capsule, Take 1 capsule by mouth once daily, Disp: 90 capsule, Rfl: 0   estradiol (ESTRACE) 2 MG tablet, Take 1 tablet by mouth once daily, Disp: 90 tablet, Rfl: 0   famotidine (PEPCID) 20 MG tablet, Take 20 mg by mouth daily as needed for heartburn or indigestion., Disp: , Rfl:    GATTEX 5 MG KIT, Inject 1.5 mg into the skin daily., Disp: , Rfl:    hydrALAZINE (APRESOLINE) 100 MG tablet, Take 1 tablet (100 mg total) by mouth 3 (three) times daily., Disp: 90 tablet, Rfl: 1   HYDROmorphone (DILAUDID) 4 MG tablet, Take 4 mg by mouth 4 (four) times daily as needed for moderate pain or severe pain., Disp: , Rfl:    levETIRAcetam (KEPPRA) 500 MG tablet, Take 1 tablet (500 mg total) by mouth 2 (two) times daily., Disp: 60 tablet, Rfl: 11   lidocaine (LIDODERM) 5 %, Place 1 patch onto the skin daily. Remove & Discard patch within 12 hours or as directed by MD, Disp: 14 patch, Rfl: 0   lipase/protease/amylase (CREON) 36000 UNITS CPEP capsule, Take 1 capsule (36,000 Units total) by mouth See admin instructions. 36000 units with meals (Patient taking differently: Take 36,000 Units by mouth 3 (three) times daily with meals.), Disp: , Rfl:    Menthol, Topical Analgesic, (BIOFREEZE EX), Apply 1 application topically as needed (pain)., Disp: , Rfl:    methadone (DOLOPHINE) 5 MG tablet, Take 1 tablet (5 mg total) by mouth 5 (five) times daily., Disp: 30 tablet, Rfl: 0   methocarbamol (ROBAXIN) 500 MG tablet, Take 1 tablet (500 mg total) by  mouth in  the morning and at bedtime., Disp: 60 tablet, Rfl: 1   Multiple Vitamins-Minerals (MULTIVITAMIN ADULT PO), Take 1 tablet by mouth daily., Disp: , Rfl:    NARCAN 4 MG/0.1ML LIQD nasal spray kit, Place 1 spray into the nose once as needed (overdose)., Disp: , Rfl:    polyvinyl alcohol (LIQUIFILM TEARS) 1.4 % ophthalmic solution, Place 1 drop into both eyes as needed for dry eyes., Disp: , Rfl:    PRESCRIPTION MEDICATION, Inject 1 each into the vein daily. Home TPN . [facility] [place] . 1 bag for 12 hours. [phone], Disp: , Rfl:    promethazine (PHENERGAN) 25 MG tablet, TAKE 1 TABLET BY MOUTH EVERY 6 HOURS AS NEEDED FOR NAUSEA OR VOMITING, Disp: 90 tablet, Rfl: 0   sodium chloride 0.9 % infusion, Inject 1 mL into the vein daily as needed (flush)., Disp: , Rfl:    sucralfate (CARAFATE) 1 GM/10ML suspension, Take 10 mLs (1 g total) by mouth 2 (two) times daily., Disp: 420 mL, Rfl: 2   Trace Minerals Cu-Mn-Se-Zn (TRALEMENT IV), Inject 1 mL into the vein See admin instructions. Used in TPN bag 4 times weekly, Disp: , Rfl:    vitamin B-12 (CYANOCOBALAMIN) 1000 MCG tablet, Take 1,000 mcg by mouth daily. , Disp: , Rfl:    DULoxetine (CYMBALTA) 60 MG capsule, Take 2 capsules (120 mg total) by mouth daily., Disp: 180 capsule, Rfl: 1  Review of Systems:  Constitutional: Denies fever, chills, diaphoresis. HEENT: Denies photophobia, eye pain, redness,  congestion, sore throat, rhinorrhea, sneezing, mouth sores, trouble swallowing, neck pain, neck stiffness and tinnitus.   Respiratory: Denies SOB, DOE, cough, chest tightness,  and wheezing.   Cardiovascular: Denies chest pain, palpitations Gastrointestinal: Denies constipation, blood in stool  Genitourinary: Denies dysuria, urgency, frequency, hematuria, flank pain and difficulty urinating.  Endocrine: Denies: hot or cold intolerance, sweats, changes in hair or nails, polyuria, polydipsia. Musculoskeletal: Positive for  myalgias, back pain, joint swelling, arthralgias and gait problem.  Skin: Denies pallor, rash and wound.  Neurological: Denies dizziness, seizures, syncope, weakness, light-headedness, numbness and headaches.  Hematological: Denies adenopathy. Easy bruising, personal or family bleeding history  Psychiatric/Behavioral: Denies suicidal ideation, mood changes, confusion, nervousness, sleep disturbance and agitation    Physical Exam: Vitals:   09/15/21 1555  BP: (!) 160/80  Pulse: 72  Temp: 98.6 F (37 C)  TempSrc: Oral  SpO2: 97%  Weight: 136 lb 3.2 oz (61.8 kg)    Body mass index is 20.71 kg/m.   Constitutional: NAD, calm, comfortable Eyes: PERRL, lids and conjunctivae normal ENMT: Mucous membranes are moist.  Tympanic membrane is pearly white, no erythema or bulging. Respiratory: clear to auscultation bilaterally, no wheezing, no crackles. Normal respiratory effort. No accessory muscle use.  Cardiovascular: Regular rate and rhythm, no murmurs / rubs / gallops.  She has 2+ pitting edema bilaterally up to around her knees. Abdomen: Abdomen is tense with multiple surgical scars, no pain to palpation, she has positive bowel sounds. Psychiatric: Normal judgment and insight. Alert and oriented x 3. Normal mood.    Impression and Plan:  Crohn's disease of small and large intestines with complication ([place]) - Plan: DULoxetine (CYMBALTA) 60 MG capsule  Chronic pain syndrome - Plan: DULoxetine (CYMBALTA) 60 MG capsule  Needs flu shot - Plan: Flu Vaccine QUAD High Dose(Fluad)  Edema, unspecified type  -I think her abdominal and leg edema is multifactorial likely in large part due to hypoalbuminemia but also due to her kidney dysfunction  and her grade 1 diastolic dysfunction.  I believe it would be beneficial for her to use mechanic measures such as compression stockings and leg elevation.  I do not think that prescribing a fluid pill would be correct given her degree of kidney  dysfunction.  She does not have shortness of breath. -She will see about having the home health company forward her weekly lab results to [place] so that we can trend her creatinine and liver function as well as albumin levels. -She is requesting refills of her Cymbalta as well as a flu vaccine which has been administered today.  Time spent: 36 minutes reviewing chart, interviewing and examining patient and formulating plan of care.     [name], MD [facility]

## 2021-09-21 LAB — CBC AND DIFFERENTIAL
HCT: 24 — AB (ref 36–46)
Hemoglobin: 8.2 — AB (ref 12.0–16.0)
Neutrophils Absolute: 1341
Platelets: 178 (ref 150–399)
WBC: 3.2

## 2021-09-21 LAB — COMPREHENSIVE METABOLIC PANEL
Albumin: 2.9 — AB (ref 3.5–5.0)
Calcium: 8.4 — AB (ref 8.7–10.7)
Globulin: 2.9

## 2021-09-21 LAB — CBC: RBC: 2.64 — AB (ref 3.87–5.11)

## 2021-09-21 LAB — BASIC METABOLIC PANEL
BUN: 35 — AB (ref 4–21)
CO2: 26 — AB (ref 13–22)
Chloride: 105 (ref 99–108)
Creatinine: 1.4 — AB (ref 0.5–1.1)
Glucose: 88
Potassium: 4.2 (ref 3.4–5.3)
Sodium: 139 (ref 137–147)

## 2021-09-21 LAB — HEPATIC FUNCTION PANEL
ALT: 24 (ref 7–35)
AST: 16 (ref 13–35)
Alkaline Phosphatase: 101 (ref 25–125)
Bilirubin, Total: 0.4

## 2021-09-22 ENCOUNTER — Telehealth: Payer: Self-pay

## 2021-09-22 ENCOUNTER — Other Ambulatory Visit: Payer: Self-pay | Admitting: Internal Medicine

## 2021-09-22 DIAGNOSIS — K912 Postsurgical malabsorption, not elsewhere classified: Secondary | ICD-10-CM

## 2021-09-22 NOTE — Telephone Encounter (Signed)
Patient is due for her Prolia injection on or after 10/02/21.   - estimated cost is $0 - appointment for injection scheduled for 11/14 at 10am - last vitamin d done 04/07/21 - last calcium done 07/26/21 - last dexa 01/12/21

## 2021-09-25 ENCOUNTER — Encounter (HOSPITAL_COMMUNITY): Payer: Self-pay

## 2021-09-25 ENCOUNTER — Emergency Department (HOSPITAL_COMMUNITY): Payer: Medicare Other

## 2021-09-25 ENCOUNTER — Inpatient Hospital Stay (HOSPITAL_COMMUNITY)
Admission: EM | Admit: 2021-09-25 | Discharge: 2021-09-29 | DRG: 314 | Disposition: A | Discharge status: Home Health | Payer: Medicare Other | Attending: Internal Medicine | Admitting: Internal Medicine

## 2021-09-25 DIAGNOSIS — Z832 Family history of diseases of the blood and blood-forming organs and certain disorders involving the immune mechanism: Secondary | ICD-10-CM

## 2021-09-25 DIAGNOSIS — K5080 Crohn's disease of both small and large intestine without complications: Secondary | ICD-10-CM | POA: Diagnosis present

## 2021-09-25 DIAGNOSIS — G40909 Epilepsy, unspecified, not intractable, without status epilepticus: Secondary | ICD-10-CM | POA: Diagnosis present

## 2021-09-25 DIAGNOSIS — A4150 Gram-negative sepsis, unspecified: Secondary | ICD-10-CM | POA: Diagnosis present

## 2021-09-25 DIAGNOSIS — Z20822 Contact with and (suspected) exposure to covid-19: Secondary | ICD-10-CM | POA: Diagnosis present

## 2021-09-25 DIAGNOSIS — Z79891 Long term (current) use of opiate analgesic: Secondary | ICD-10-CM

## 2021-09-25 DIAGNOSIS — D649 Anemia, unspecified: Secondary | ICD-10-CM | POA: Diagnosis not present

## 2021-09-25 DIAGNOSIS — E876 Hypokalemia: Secondary | ICD-10-CM | POA: Diagnosis present

## 2021-09-25 DIAGNOSIS — Z820 Family history of epilepsy and other diseases of the nervous system: Secondary | ICD-10-CM | POA: Diagnosis not present

## 2021-09-25 DIAGNOSIS — T80211D Bloodstream infection due to central venous catheter, subsequent encounter: Secondary | ICD-10-CM

## 2021-09-25 DIAGNOSIS — R509 Fever, unspecified: Secondary | ICD-10-CM

## 2021-09-25 DIAGNOSIS — D696 Thrombocytopenia, unspecified: Secondary | ICD-10-CM | POA: Diagnosis present

## 2021-09-25 DIAGNOSIS — K219 Gastro-esophageal reflux disease without esophagitis: Secondary | ICD-10-CM | POA: Diagnosis present

## 2021-09-25 DIAGNOSIS — Y848 Other medical procedures as the cause of abnormal reaction of the patient, or of later complication, without mention of misadventure at the time of the procedure: Secondary | ICD-10-CM | POA: Diagnosis present

## 2021-09-25 DIAGNOSIS — N1832 Chronic kidney disease, stage 3b: Secondary | ICD-10-CM | POA: Diagnosis present

## 2021-09-25 DIAGNOSIS — I129 Hypertensive chronic kidney disease with stage 1 through stage 4 chronic kidney disease, or unspecified chronic kidney disease: Secondary | ICD-10-CM | POA: Diagnosis present

## 2021-09-25 DIAGNOSIS — D509 Iron deficiency anemia, unspecified: Secondary | ICD-10-CM | POA: Diagnosis present

## 2021-09-25 DIAGNOSIS — Z515 Encounter for palliative care: Secondary | ICD-10-CM | POA: Diagnosis not present

## 2021-09-25 DIAGNOSIS — K529 Noninfective gastroenteritis and colitis, unspecified: Secondary | ICD-10-CM | POA: Diagnosis not present

## 2021-09-25 DIAGNOSIS — Z8249 Family history of ischemic heart disease and other diseases of the circulatory system: Secondary | ICD-10-CM

## 2021-09-25 DIAGNOSIS — E538 Deficiency of other specified B group vitamins: Secondary | ICD-10-CM | POA: Diagnosis present

## 2021-09-25 DIAGNOSIS — K5010 Crohn's disease of large intestine without complications: Secondary | ICD-10-CM | POA: Diagnosis present

## 2021-09-25 DIAGNOSIS — T80211A Bloodstream infection due to central venous catheter, initial encounter: Secondary | ICD-10-CM | POA: Diagnosis present

## 2021-09-25 DIAGNOSIS — G894 Chronic pain syndrome: Secondary | ICD-10-CM | POA: Diagnosis present

## 2021-09-25 DIAGNOSIS — K912 Postsurgical malabsorption, not elsewhere classified: Secondary | ICD-10-CM | POA: Diagnosis present

## 2021-09-25 DIAGNOSIS — T827XXA Infection and inflammatory reaction due to other cardiac and vascular devices, implants and grafts, initial encounter: Secondary | ICD-10-CM | POA: Diagnosis not present

## 2021-09-25 DIAGNOSIS — E43 Unspecified severe protein-calorie malnutrition: Secondary | ICD-10-CM | POA: Diagnosis present

## 2021-09-25 DIAGNOSIS — Z83511 Family history of glaucoma: Secondary | ICD-10-CM

## 2021-09-25 DIAGNOSIS — Z803 Family history of malignant neoplasm of breast: Secondary | ICD-10-CM

## 2021-09-25 DIAGNOSIS — Z888 Allergy status to other drugs, medicaments and biological substances status: Secondary | ICD-10-CM

## 2021-09-25 DIAGNOSIS — R7881 Bacteremia: Secondary | ICD-10-CM | POA: Diagnosis not present

## 2021-09-25 DIAGNOSIS — Z833 Family history of diabetes mellitus: Secondary | ICD-10-CM | POA: Diagnosis not present

## 2021-09-25 DIAGNOSIS — M810 Age-related osteoporosis without current pathological fracture: Secondary | ICD-10-CM | POA: Diagnosis present

## 2021-09-25 DIAGNOSIS — F32A Depression, unspecified: Secondary | ICD-10-CM | POA: Diagnosis present

## 2021-09-25 DIAGNOSIS — Z6820 Body mass index (BMI) 20.0-20.9, adult: Secondary | ICD-10-CM

## 2021-09-25 DIAGNOSIS — Z87891 Personal history of nicotine dependence: Secondary | ICD-10-CM | POA: Diagnosis not present

## 2021-09-25 DIAGNOSIS — Z7189 Other specified counseling: Secondary | ICD-10-CM | POA: Diagnosis not present

## 2021-09-25 DIAGNOSIS — Z79899 Other long term (current) drug therapy: Secondary | ICD-10-CM

## 2021-09-25 DIAGNOSIS — Z885 Allergy status to narcotic agent status: Secondary | ICD-10-CM

## 2021-09-25 DIAGNOSIS — K50118 Crohn's disease of large intestine with other complication: Secondary | ICD-10-CM | POA: Diagnosis not present

## 2021-09-25 LAB — COMPREHENSIVE METABOLIC PANEL
ALT: 18 U/L (ref 0–44)
AST: 17 U/L (ref 15–41)
Albumin: 2.5 g/dL — ABNORMAL LOW (ref 3.5–5.0)
Alkaline Phosphatase: 117 U/L (ref 38–126)
Anion gap: 6 (ref 5–15)
BUN: 38 mg/dL — ABNORMAL HIGH (ref 6–20)
CO2: 27 mmol/L (ref 22–32)
Calcium: 8.6 mg/dL — ABNORMAL LOW (ref 8.9–10.3)
Chloride: 105 mmol/L (ref 98–111)
Creatinine, Ser: 1.64 mg/dL — ABNORMAL HIGH (ref 0.44–1.00)
GFR, Estimated: 36 mL/min — ABNORMAL LOW (ref >60)
Glucose, Bld: 101 mg/dL — ABNORMAL HIGH (ref 70–99)
Potassium: 3.2 mmol/L — ABNORMAL LOW (ref 3.5–5.1)
Sodium: 138 mmol/L (ref 135–145)
Total Bilirubin: 0.7 mg/dL (ref 0.3–1.2)
Total Protein: 6 g/dL — ABNORMAL LOW (ref 6.5–8.1)

## 2021-09-25 LAB — CBC WITH DIFFERENTIAL/PLATELET
Abs Immature Granulocytes: 0.03 10*3/uL (ref 0.00–0.07)
Basophils Absolute: 0 10*3/uL (ref 0.0–0.1)
Basophils Relative: 0 %
Eosinophils Absolute: 0 10*3/uL (ref 0.0–0.5)
Eosinophils Relative: 0 %
HCT: 22 % — ABNORMAL LOW (ref 36.0–46.0)
Hemoglobin: 7.4 g/dL — ABNORMAL LOW (ref 12.0–15.0)
Immature Granulocytes: 0 %
Lymphocytes Relative: 10 %
Lymphs Abs: 0.7 10*3/uL (ref 0.7–4.0)
MCH: 31.1 pg (ref 26.0–34.0)
MCHC: 33.6 g/dL (ref 30.0–36.0)
MCV: 92.4 fL (ref 80.0–100.0)
Monocytes Absolute: 0.4 10*3/uL (ref 0.1–1.0)
Monocytes Relative: 6 %
Neutro Abs: 5.6 10*3/uL (ref 1.7–7.7)
Neutrophils Relative %: 84 %
Platelets: 147 10*3/uL — ABNORMAL LOW (ref 150–400)
RBC: 2.38 MIL/uL — ABNORMAL LOW (ref 3.87–5.11)
RDW: 15.9 % — ABNORMAL HIGH (ref 11.5–15.5)
WBC: 6.7 10*3/uL (ref 4.0–10.5)
nRBC: 0 % (ref 0.0–0.2)

## 2021-09-25 LAB — LIPASE, BLOOD: Lipase: 24 U/L (ref 11–51)

## 2021-09-25 LAB — RESP PANEL BY RT-PCR (FLU A&B, COVID) ARPGX2
Influenza A by PCR: NEGATIVE
Influenza B by PCR: NEGATIVE
SARS Coronavirus 2 by RT PCR: NEGATIVE

## 2021-09-25 LAB — LACTIC ACID, PLASMA: Lactic Acid, Venous: 0.8 mmol/L (ref 0.5–1.9)

## 2021-09-25 MED ORDER — ADULT MULTIVITAMIN W/MINERALS CH
1.0000 | ORAL_TABLET | Freq: Every day | ORAL | Status: DC
  Administered 2021-09-26 – 2021-09-29 (×4): 1 via ORAL
  Filled 2021-09-25 (×4): qty 1

## 2021-09-25 MED ORDER — DULOXETINE HCL 30 MG PO CPEP
120.0000 mg | ORAL_CAPSULE | Freq: Every day | ORAL | Status: DC
  Administered 2021-09-26 – 2021-09-29 (×4): 120 mg via ORAL
  Filled 2021-09-25 (×4): qty 4

## 2021-09-25 MED ORDER — SODIUM CHLORIDE 0.9 % IV BOLUS
1000.0000 mL | Freq: Once | INTRAVENOUS | Status: AC
  Administered 2021-09-25: 1000 mL via INTRAVENOUS

## 2021-09-25 MED ORDER — SODIUM CHLORIDE 0.9 % IV SOLN
1.0000 g | Freq: Once | INTRAVENOUS | Status: AC
  Administered 2021-09-25: 1 g via INTRAVENOUS
  Filled 2021-09-25: qty 10

## 2021-09-25 MED ORDER — HYDROMORPHONE HCL 1 MG/ML IJ SOLN
1.0000 mg | Freq: Once | INTRAMUSCULAR | Status: AC
  Administered 2021-09-25: 1 mg via INTRAVENOUS
  Filled 2021-09-25: qty 1

## 2021-09-25 MED ORDER — PANCRELIPASE (LIP-PROT-AMYL) 36000-114000 UNITS PO CPEP
36000.0000 [IU] | ORAL_CAPSULE | Freq: Three times a day (TID) | ORAL | Status: DC
  Administered 2021-09-26 – 2021-09-29 (×11): 36000 [IU] via ORAL
  Filled 2021-09-25 (×13): qty 1

## 2021-09-25 MED ORDER — IOHEXOL 350 MG/ML SOLN
70.0000 mL | Freq: Once | INTRAVENOUS | Status: AC | PRN
  Administered 2021-09-25: 70 mL via INTRAVENOUS

## 2021-09-25 MED ORDER — ONDANSETRON HCL 4 MG/2ML IJ SOLN
4.0000 mg | Freq: Once | INTRAMUSCULAR | Status: AC
  Administered 2021-09-25: 4 mg via INTRAVENOUS
  Filled 2021-09-25: qty 2

## 2021-09-25 MED ORDER — VITAMIN B-12 1000 MCG PO TABS
1000.0000 ug | ORAL_TABLET | Freq: Every day | ORAL | Status: DC
  Administered 2021-09-26 – 2021-09-29 (×4): 1000 ug via ORAL
  Filled 2021-09-25 (×4): qty 1

## 2021-09-25 MED ORDER — METRONIDAZOLE 500 MG/100ML IV SOLN
500.0000 mg | Freq: Once | INTRAVENOUS | Status: DC
  Filled 2021-09-25: qty 100

## 2021-09-25 MED ORDER — HYDRALAZINE HCL 50 MG PO TABS
100.0000 mg | ORAL_TABLET | Freq: Three times a day (TID) | ORAL | Status: DC
  Administered 2021-09-26 – 2021-09-29 (×10): 100 mg via ORAL
  Filled 2021-09-25 (×10): qty 2

## 2021-09-25 MED ORDER — SODIUM CHLORIDE 0.9 % IV SOLN
12.5000 mg | Freq: Once | INTRAVENOUS | Status: AC
  Administered 2021-09-25: 12.5 mg via INTRAVENOUS
  Filled 2021-09-25: qty 12.5

## 2021-09-25 MED ORDER — ONDANSETRON HCL 4 MG/2ML IJ SOLN
4.0000 mg | Freq: Four times a day (QID) | INTRAMUSCULAR | Status: DC | PRN
  Administered 2021-09-25: 4 mg via INTRAVENOUS
  Filled 2021-09-25: qty 2

## 2021-09-25 MED ORDER — PANTOPRAZOLE SODIUM 40 MG PO TBEC
40.0000 mg | DELAYED_RELEASE_TABLET | Freq: Every day | ORAL | Status: DC
  Administered 2021-09-26 – 2021-09-29 (×4): 40 mg via ORAL
  Filled 2021-09-25 (×4): qty 1

## 2021-09-25 MED ORDER — ACETAMINOPHEN 325 MG PO TABS
650.0000 mg | ORAL_TABLET | Freq: Four times a day (QID) | ORAL | Status: DC | PRN
  Administered 2021-09-27 – 2021-09-28 (×2): 650 mg via ORAL
  Filled 2021-09-25 (×3): qty 2

## 2021-09-25 MED ORDER — POLYVINYL ALCOHOL 1.4 % OP SOLN
1.0000 [drp] | OPHTHALMIC | Status: DC | PRN
  Administered 2021-09-29 (×2): 1 [drp] via OPHTHALMIC
  Filled 2021-09-25: qty 15

## 2021-09-25 MED ORDER — HEPARIN SODIUM (PORCINE) 5000 UNIT/ML IJ SOLN
5000.0000 [IU] | Freq: Three times a day (TID) | INTRAMUSCULAR | Status: DC
  Administered 2021-09-26 – 2021-09-29 (×11): 5000 [IU] via SUBCUTANEOUS
  Filled 2021-09-25 (×12): qty 1

## 2021-09-25 MED ORDER — AMLODIPINE BESYLATE 10 MG PO TABS
10.0000 mg | ORAL_TABLET | Freq: Every day | ORAL | Status: DC
  Administered 2021-09-26 – 2021-09-29 (×4): 10 mg via ORAL
  Filled 2021-09-25 (×4): qty 1

## 2021-09-25 MED ORDER — HYDROMORPHONE HCL 2 MG PO TABS
4.0000 mg | ORAL_TABLET | Freq: Four times a day (QID) | ORAL | Status: DC | PRN
  Administered 2021-09-26 (×2): 4 mg via ORAL
  Filled 2021-09-25 (×2): qty 2

## 2021-09-25 MED ORDER — CARVEDILOL 6.25 MG PO TABS
6.2500 mg | ORAL_TABLET | Freq: Two times a day (BID) | ORAL | Status: DC
  Administered 2021-09-26 – 2021-09-28 (×5): 6.25 mg via ORAL
  Filled 2021-09-25 (×5): qty 1

## 2021-09-25 MED ORDER — LACTATED RINGERS IV SOLN: INTRAVENOUS | Status: AC

## 2021-09-25 MED ORDER — METHOCARBAMOL 500 MG PO TABS
500.0000 mg | ORAL_TABLET | Freq: Three times a day (TID) | ORAL | Status: DC | PRN
  Administered 2021-09-28 – 2021-09-29 (×2): 500 mg via ORAL
  Filled 2021-09-25 (×2): qty 1

## 2021-09-25 MED ORDER — IBUPROFEN 800 MG PO TABS
800.0000 mg | ORAL_TABLET | Freq: Once | ORAL | Status: AC
  Administered 2021-09-25: 800 mg via ORAL
  Filled 2021-09-25: qty 1

## 2021-09-25 MED ORDER — BUPROPION HCL ER (SR) 100 MG PO TB12
200.0000 mg | ORAL_TABLET | Freq: Every day | ORAL | Status: DC
  Administered 2021-09-26 – 2021-09-29 (×4): 200 mg via ORAL
  Filled 2021-09-25 (×4): qty 2

## 2021-09-25 MED ORDER — CHLORHEXIDINE GLUCONATE CLOTH 2 % EX PADS
6.0000 | MEDICATED_PAD | Freq: Every day | CUTANEOUS | Status: DC
  Administered 2021-09-27 – 2021-09-29 (×4): 6 via TOPICAL

## 2021-09-25 MED ORDER — CYCLOSPORINE 0.05 % OP EMUL
1.0000 [drp] | Freq: Two times a day (BID) | OPHTHALMIC | Status: DC
  Administered 2021-09-26 – 2021-09-29 (×7): 1 [drp] via OPHTHALMIC
  Filled 2021-09-25 (×8): qty 30

## 2021-09-25 MED ORDER — VITAMIN D 25 MCG (1000 UNIT) PO TABS
1000.0000 [IU] | ORAL_TABLET | Freq: Every day | ORAL | Status: DC
  Administered 2021-09-26 – 2021-09-29 (×4): 1000 [IU] via ORAL
  Filled 2021-09-25 (×4): qty 1

## 2021-09-25 MED ORDER — FENTANYL CITRATE PF 50 MCG/ML IJ SOSY
50.0000 ug | PREFILLED_SYRINGE | Freq: Once | INTRAMUSCULAR | Status: AC
  Administered 2021-09-25: 50 ug via INTRAVENOUS
  Filled 2021-09-25: qty 1

## 2021-09-25 MED ORDER — METHADONE HCL 10 MG PO TABS
5.0000 mg | ORAL_TABLET | Freq: Every day | ORAL | Status: DC
  Administered 2021-09-26 – 2021-09-28 (×12): 5 mg via ORAL
  Filled 2021-09-25 (×12): qty 1

## 2021-09-25 MED ORDER — MELATONIN 3 MG PO TABS
3.0000 mg | ORAL_TABLET | Freq: Every evening | ORAL | Status: DC | PRN
  Administered 2021-09-26 – 2021-09-28 (×2): 3 mg via ORAL
  Filled 2021-09-25 (×2): qty 1

## 2021-09-25 MED ORDER — SUCRALFATE 1 GM/10ML PO SUSP
1.0000 g | Freq: Two times a day (BID) | ORAL | Status: DC
  Administered 2021-09-26 – 2021-09-29 (×8): 1 g via ORAL
  Filled 2021-09-25 (×8): qty 10

## 2021-09-25 MED ORDER — POTASSIUM CHLORIDE 10 MEQ/100ML IV SOLN
10.0000 meq | Freq: Once | INTRAVENOUS | Status: AC
  Administered 2021-09-25: 10 meq via INTRAVENOUS
  Filled 2021-09-25: qty 100

## 2021-09-25 MED ORDER — LEVETIRACETAM 500 MG PO TABS
500.0000 mg | ORAL_TABLET | Freq: Two times a day (BID) | ORAL | Status: DC
  Filled 2021-09-25: qty 1

## 2021-09-25 NOTE — ED Provider Notes (Signed)
Caitlin Peters   CSN: [id number] Arrival date & time: 09/25/21  1702     History Chief Complaint  Patient presents with   Fever    Caitlin Peters is a 60 y.o. female.  Pt presents to the ED today with fever and abdominal pain.  Pt has a complicated history of Crohn's disease with multiple surgeries.  She only has 165 cm of small bowel.  She has been hospitalized frequently for line sepsis.  She requires PICC lines due to the need for TPN.  She was last hospitalized in August for line sepsis.  Pt said she has been having more pain in her abdomen and had a fever at home.  She took 1 g of tylenol en route.  She does have some dysuria as well.  Slight increase in stool volume.      Past Medical History:  Diagnosis Date   Acute pancreatitis 04/13/2020   Anasarca 10/2019   AVN (avascular necrosis of bone) ([name])    Cataract    Choledocholithiasis (sludge) s/p ERCP 10/2019 10/21/2020   Chronic pain syndrome    CKD (chronic kidney disease), stage III ([place])    Crohn disease ([place])    Crohn's disease of small & large intestine with SGS 1984   Caitlin Peters is a 60 year old female with a history of Crohn's disease diagnosed in [birth year] (age 20), history of long-term steroid use with osteoporosis, S/P multiple bowel resections ([id number]) complicated by chronic back and abdominal pain, steatorrhea and short bowel syndrome. The patient has been left with ~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 attached to proximal transverse colon through rectum. 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  ESTABLISHED AT [facility]   Vitamin B12 deficiency     Patient Active Problem List   Diagnosis Date Noted   Vertebral osteomyelitis ([place])    Bloodstream infection due to central venous catheter    Thrombocytopenia ([name])    Malnutrition of moderate degree 07/06/2021   Transaminitis    Hypophosphatemia    Chronic idiopathic constipation    Left shoulder pain 05/23/2021   Elevated LFTs    Sepsis ([place]) 05/06/2021   Acidosis 04/08/2021   Colitis 04/07/2021   Intractable nausea and vomiting 03/11/2021   Thoracic discitis 02/10/2021   Pressure injury of skin 12/18/2020   Bacteremia 12/17/2020   Seizures ([place]) 12/17/2020   Drug-seeking behavior 12/16/2020   Seizure ([place]) 12/15/2020   CKD (chronic kidney disease) stage 3, GFR 30-59 ml/min ([place]) 12/06/2020   COVID-19 virus infection 12/06/2020   Protein calorie malnutrition ([place]) 12/06/2020   Palpitations 11/22/2020   PAC (premature atrial contraction) 11/22/2020   History of central line-associated bloodstream infection (CLABSI) 10/21/2020   Nausea & vomiting 10/21/2020   Choledocholithiasis (sludge) s/p ERCP 10/2019 10/21/2020   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   Abdominal pain 47/82/9562   Acute metabolic encephalopathy    Hypomagnesemia    Partial small bowel obstruction ([place])  Bacteremia associated with intravascular line ([place]) 08/04/2020   Enterobacter sepsis ([place]) 07/22/2020   Fever of unknown origin 06/27/2020   Anxiety 06/03/2020   Acute pancreatitis 04/13/2020   Infection due to Acinetobacter species 03/10/2020   Pancytopenia ([employer] [facility]) 03/05/2020   Central line infection    Elevated liver enzymes 01/02/2020   Cholangitis    Anasarca 10/28/2019   Acute kidney injury superimposed on chronic kidney disease ([place]) 10/28/2019   Falls 10/28/2019   Malnutrition ([place])    Vitamin B12 deficiency    GERD (gastroesophageal reflux disease)     Chronic pain syndrome    Hypokalemia due to excessive gastrointestinal loss of potassium 10/13/2019   Fever    Hypokalemia 10/12/2019   Chronic diarrhea 10/12/2019   Dysuria 10/12/2019   Bilateral lower extremity edema 10/12/2019   Hypertension 10/12/2019   AKI (acute kidney injury) ([place]) 10/12/2019   Candidemia ([place]) 08/27/2019   IDA (iron deficiency anemia) 11/03/2018   Dilation of biliary tract 08/28/2018   Severe diarrhea 03/07/2018   LFTs abnormal 01/27/2018   GI tract obstruction ([place]) 01/27/2018   Gram-negative bacteremia 09/13/2017   HTN (hypertension), benign 12/02/2016   Intractable pain 12/02/2016   Anemia 12/02/2016   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   Avascular necrosis ([place]) 06/14/2015   Polyarthralgia 05/03/2015   On total parenteral nutrition (TPN) 12/30/2014   Osteoporosis 12/24/2014   Low back pain 12/16/2014   Vitamin D deficiency 12/16/2014   Short gut syndrome 07/15/2014   History of colonic diverticulitis 2014   Depression 07/24/2012   Gram-positive bacteremia 07/24/2012   Small bowel obstruction due to adhesions ([place]) 07/24/2012   Diarrhea 12/13/2011   Neuralgia and neuritis 06/01/2011   Myalgia and myositis 08/12/2003   Crohn's disease of both small and large intestine with other complication ([place]) 5038    Past Surgical History:  Procedure Laterality Date   ABDOMINAL ADHESION SURGERY  01/22/2018   APPENDECTOMY  1989   BILIARY DILATION  11/26/2019   Procedure: BILIARY DILATION;  Surgeon: Jackquline Denmark, MD;  Location: [facility];  Service: Endoscopy;;   BILIARY DILATION  03/08/2020   Procedure: BILIARY DILATION;  Surgeon: Irving Copas., MD;  Location: [place];  Service: Gastroenterology;;   BIOPSY  03/08/2020   Procedure: BIOPSY;  Surgeon: Irving Copas., MD;  Location: [facility];  Service: Gastroenterology;;   CHEST WALL RESECTION     right thoracotomy,resection of chest mass with anterior rib and reconstruction using  prosthetic mesh and video arthroscopy   CHOLECYSTECTOMY  01/22/2018   COLONOSCOPY  2019   ENTEROSTOMY CLOSURE  04/1999   ERCP N/A 11/26/2019   Procedure: ENDOSCOPIC RETROGRADE CHOLANGIOPANCREATOGRAPHY (ERCP);  Surgeon: Jackquline Denmark, MD;  Location: [facility];  Service: Endoscopy;  Laterality: N/A;   ERCP N/A 03/08/2020   Procedure: ENDOSCOPIC RETROGRADE CHOLANGIOPANCREATOGRAPHY (ERCP);  Surgeon: Irving Copas., MD;  Location: [place];  Service: Gastroenterology;  Laterality: N/A;   ESOPHAGOGASTRODUODENOSCOPY N/A 03/08/2020   Procedure: ESOPHAGOGASTRODUODENOSCOPY (EGD);  Surgeon: Irving Copas., MD;  Location: [place];  Service: Gastroenterology;  Laterality: N/A;   EUS N/A 03/08/2020   Procedure: UPPER ENDOSCOPIC ULTRASOUND (EUS) LINEAR;  Surgeon: Irving Copas., MD;  Location: [place];  Service: Gastroenterology;  Laterality: N/A;   ILEOCECETOMY  03/1999   ileocolon resection with abdominal stoma   ILEOSTOMY CLOSURE  2001   IR FLUORO GUIDE CV LINE LEFT  01/07/2020   IR FLUORO GUIDE CV LINE LEFT  03/09/2020   IR FLUORO GUIDE CV LINE LEFT  05/09/2020  IR FLUORO GUIDE CV LINE LEFT  07/20/2020   IR FLUORO GUIDE CV LINE RIGHT  08/05/2020   IR FLUORO GUIDE CV LINE RIGHT  04/03/2021   IR PTA VENOUS EXCEPT DIALYSIS CIRCUIT  01/07/2020   IR REMOVAL TUN CV CATH W/O FL  08/05/2020   IR REMOVAL TUN CV CATH W/O FL  07/11/2021   IR US GUIDE VASC ACCESS LEFT     x 2 06/17/19 and 09/14/2019   IR US GUIDE VASC ACCESS RIGHT  08/05/2020   KNEE SURGERY     right knee    LAPAROSCOPIC SMALL BOWEL RESECTION  2009   2000-2009.  SB resections for Crohns Disease - now with Short gut   OMENTECTOMY  01/22/2018   PARTIAL HYSTERECTOMY  1984   with LSO   REMOVAL OF STONES  11/26/2019   Procedure: REMOVAL OF STONES;  Surgeon: Jackquline Denmark, MD;  Location: [facility];  Service: Endoscopy;;   REMOVAL OF STONES  03/08/2020   Procedure: REMOVAL OF STONES;  Surgeon: Irving Copas., MD;  Location: [facility];  Service: Gastroenterology;;   SALPINGOOPHORECTOMY Left 1984   SALPINGOOPHORECTOMY Right 1990   SERIAL TRANSVERSE ENTEROPLASTY (STEP) - SMALL BOWEL LENGTHENING  01/22/2018   Dr Alene Mires, [facility] - SB length from 120 to 165cm    SMALL INTESTINE SURGERY  2002   SMALL INTESTINE SURGERY  2003   SPHINCTEROTOMY  11/26/2019   Procedure: SPHINCTEROTOMY;  Surgeon: Jackquline Denmark, MD;  Location: [facility];  Service: Endoscopy;;   TOTAL ABDOMINAL HYSTERECTOMY  1990   with RSO   UPPER GASTROINTESTINAL ENDOSCOPY       OB History   No obstetric history on file.     Family History  Problem Relation Age of Onset   Seizures Mother    Glaucoma Mother    CAD Father    Heart disease Father    Hypertension Father    Breast cancer Sister    Multiple sclerosis Sister    Diabetes Sister    Lupus Sister    Colon cancer Other    Crohn's disease Other     Social History   Tobacco Use   Smoking status: Former   Smokeless tobacco: Never  Scientific laboratory technician Use: Never used  Substance Use Topics   Alcohol use: Not Currently   Drug use: Never    Home Medications Prior to Admission medications   Medication Sig Start Date End Date Taking? Authorizing Provider  amLODipine (NORVASC) 10 MG tablet Take 1 tablet by mouth once daily 08/22/21   [name], [name], MD  budesonide (ENTOCORT EC) 3 MG 24 hr capsule Take 1 capsule (3 mg total) by mouth every 3 (three) days. 06/30/21   Eugenie Filler, MD  buPROPion ER [facility] SR) 100 MG 12 hr tablet Take 2 tablets by mouth once daily 08/30/21   [name], [name], MD  Calcium 200 MG TABS Take 200 mg by mouth daily.    [provider]  carvedilol (COREG) 6.25 MG tablet Take 1 tablet (6.25 mg total) by mouth 2 (two) times daily with a meal. 07/14/21   Nita Sells, MD  cholecalciferol (VITAMIN D3) 25 MCG (1000 UT) tablet Take 1,000 Units by mouth daily.     [provider]  cycloSPORINE (RESTASIS) 0.05 % ophthalmic emulsion Place 1 drop into both eyes 2 (two) times daily.    [provider]  DEXILANT 60 MG capsule Take 1 capsule by mouth once  daily 08/01/21   [name], [name], MD  DULoxetine (CYMBALTA) 60 MG capsule Take 2 capsules (120 mg total) by mouth daily. 09/15/21   [name], [name], MD  estradiol (ESTRACE) 2 MG tablet Take 1 tablet by mouth once daily 07/27/21   [name], [name], MD  famotidine (PEPCID) 20 MG tablet Take 20 mg by mouth daily as needed for heartburn or indigestion.    [provider]  GATTEX 5 MG KIT Inject 1.5 mg into the skin daily. 01/03/21   [provider]  hydrALAZINE (APRESOLINE) 100 MG tablet Take 1 tablet (100 mg total) by mouth 3 (three) times daily. 06/30/21   Eugenie Filler, MD  HYDROmorphone (DILAUDID) 4 MG tablet Take 4 mg by mouth 4 (four) times daily as needed for moderate pain or severe pain. 04/18/21   [provider]  levETIRAcetam (KEPPRA) 500 MG tablet Take 1 tablet (500 mg total) by mouth 2 (two) times daily. 08/28/21   Cameron Sprang, MD  lidocaine (LIDODERM) 5 % Place 1 patch onto the skin daily. Remove & Discard patch within 12 hours or as directed by MD 06/30/21   Eugenie Filler, MD  lipase/protease/amylase (CREON) 36000 UNITS CPEP capsule Take 1 capsule (36,000 Units total) by mouth See admin instructions. 36000 units with meals Patient taking differently: Take 36,000 Units by mouth 3 (three) times daily with meals. 06/30/21   Eugenie Filler, MD  Menthol, Topical Analgesic, (BIOFREEZE EX) Apply 1 application topically as needed (pain).    [provider]  methadone (DOLOPHINE) 5 MG tablet Take 1 tablet (5 mg total) by mouth 5 (five) times daily. 06/30/20   Florencia Reasons, MD  methocarbamol (ROBAXIN) 500 MG tablet Take 1 tablet (500 mg total) by mouth in the morning and at bedtime. 06/13/21   [name], [name], MD  Multiple  Vitamins-Minerals (MULTIVITAMIN ADULT PO) Take 1 tablet by mouth daily.    [provider]  NARCAN 4 MG/0.1ML LIQD nasal spray kit Place 1 spray into the nose once as needed (overdose). 10/14/20   [provider]  polyvinyl alcohol (LIQUIFILM TEARS) 1.4 % ophthalmic solution Place 1 drop into both eyes as needed for dry eyes.    [provider]  PRESCRIPTION MEDICATION Inject 1 each into the vein daily. Home TPN . [facility] [place] . 1 bag for 12 hours. [phone]    [provider]  promethazine (PHENERGAN) 25 MG tablet TAKE 1 TABLET BY MOUTH EVERY 6 HOURS AS NEEDED FOR NAUSEA FOR VOMITING 09/22/21   [name], [name], MD  sodium chloride 0.9 % infusion Inject 1 mL into the vein daily as needed (flush). 10/16/20   [provider]  sucralfate (CARAFATE) 1 GM/10ML suspension Take 10 mLs (1 g total) by mouth 2 (two) times daily. 06/30/21   Eugenie Filler, MD  Trace Minerals Cu-Mn-Se-Zn (TRALEMENT IV) Inject 1 mL into the vein See admin instructions. Used in TPN bag 4 times weekly    [provider]  vitamin B-12 (CYANOCOBALAMIN) 1000 MCG tablet Take 1,000 mcg by mouth daily.     [provider]    Allergies    Meperidine, Hyoscyamine, Cefepime, Gabapentin, Lyrica [pregabalin], Topamax [topiramate], Zosyn [piperacillin sod-tazobactam so], Fentanyl, and Morphine and related  Review of Systems   Review of Systems  Constitutional:  Positive for fever.  Gastrointestinal:  Positive for abdominal pain.  All other systems reviewed and are negative.  Physical  Exam Updated Vital Signs BP (!) 169/68   Pulse 72   Temp (!) 102.1 F (38.9 C) (Rectal)   Resp 17   Ht 5' 8"  (1.727 m)   Wt 62.1 kg   SpO2 95%   BMI 20.83 kg/m   Physical Exam Vitals and nursing Peters reviewed.  Constitutional:      Appearance: Normal appearance.  HENT:     Head: Normocephalic and atraumatic.     Right Ear: External ear  normal.     Left Ear: External ear normal.     Nose: Nose normal.     Mouth/Throat:     Mouth: Mucous membranes are dry.  Eyes:     Extraocular Movements: Extraocular movements intact.     Conjunctiva/sclera: Conjunctivae normal.     Pupils: Pupils are equal, round, and reactive to light.  Cardiovascular:     Rate and Rhythm: Normal rate and regular rhythm.     Pulses: Normal pulses.     Heart sounds: Normal heart sounds.  Pulmonary:     Effort: Pulmonary effort is normal.     Breath sounds: Normal breath sounds.  Abdominal:     General: Abdomen is flat. Bowel sounds are normal.     Palpations: Abdomen is soft.     Tenderness: There is generalized abdominal tenderness.  Musculoskeletal:        General: Normal range of motion.     Cervical back: Normal range of motion and neck supple.     Right lower leg: Edema present.     Left lower leg: Edema present.  Skin:    General: Skin is warm.     Capillary Refill: Capillary refill takes less than 2 seconds.  Neurological:     General: No focal deficit present.     Mental Status: She is alert and oriented to person, place, and time.  Psychiatric:        Mood and Affect: Mood normal.        Behavior: Behavior normal.    ED Results / Procedures / Treatments   Labs (all labs ordered are listed, but only abnormal results are displayed) Labs Reviewed  CBC WITH DIFFERENTIAL/PLATELET - Abnormal; Notable for the following components:      Result Value   RBC 2.38 (*)    Hemoglobin 7.4 (*)    HCT 22.0 (*)    RDW 15.9 (*)    Platelets 147 (*)    All other components within normal limits  COMPREHENSIVE METABOLIC PANEL - Abnormal; Notable for the following components:   Potassium 3.2 (*)    Glucose, Bld 101 (*)    BUN 38 (*)    Creatinine, Ser 1.64 (*)    Calcium 8.6 (*)    Total Protein 6.0 (*)    Albumin 2.5 (*)    GFR, Estimated 36 (*)    All other components within normal limits  CULTURE, BLOOD (ROUTINE X 2)  CULTURE, BLOOD  (ROUTINE X 2)  URINE CULTURE  GASTROINTESTINAL PANEL BY PCR, STOOL (REPLACES STOOL CULTURE)  C DIFFICILE QUICK SCREEN W PCR REFLEX    RESP PANEL BY RT-PCR (FLU A&B, COVID) ARPGX2  LIPASE, BLOOD  LACTIC ACID, PLASMA  URINALYSIS, ROUTINE W REFLEX MICROSCOPIC  MAGNESIUM  CBC    EKG None  Radiology CT Abdomen Pelvis W Contrast  Result Date: 09/25/2021 CLINICAL DATA:  Status post cholecystectomy. History of Crohn's disease. EXAM: CT ABDOMEN AND PELVIS WITH CONTRAST TECHNIQUE: Multidetector CT imaging of the abdomen and pelvis  was performed using the standard protocol following bolus administration of intravenous contrast. CONTRAST:  81m OMNIPAQUE IOHEXOL 350 MG/ML SOLN COMPARISON:  CT abdomen and pelvis 07/26/2021. FINDINGS: Lower chest: No acute abnormality. Hepatobiliary: Gallbladder surgically absent. There is mild intra and extrahepatic biliary ductal prominence which is stable. There is new pneumobilia in the common bile duct and left lobe of the liver. No focal liver lesion identified. Pancreas: There is mild prominence of the pancreatic duct which is unchanged. No focal pancreatic lesions are seen. Spleen: Normal in size without focal abnormality. Adrenals/Urinary Tract: Adrenal glands are unremarkable. Kidneys are normal, without renal calculi, focal lesion, or hydronephrosis. Bladder is unremarkable. Stomach/Bowel: Again seen are numerous postsurgical changes in the bowel. Colon demonstrates diffuse wall thickening with mild surrounding inflammation diffusely to the level rectum. Stomach is decompressed. There are prominent small bowel loops with air-fluid levels measuring up to 3.5 cm. No definite small bowel wall thickening identified. No pneumatosis or free air. Vascular/Lymphatic: No significant vascular findings are present. No enlarged abdominal or pelvic lymph nodes. Right femoral approach central venous catheter is again noted distal tip ending at the IVC/right atrial junction,  unchanged. Reproductive: Status post hysterectomy. No adnexal masses. Other: No abdominal wall hernia or abnormality. No abdominopelvic ascites. Musculoskeletal: No acute or significant osseous findings. IMPRESSION: 1. Diffuse colitis. 2. Mildly dilated small bowel loops may represent ileus or enteritis. Developing partial small bowel obstruction not excluded. 3. There is new pneumobilia. Please correlate with surgical history of sphincterotomy. Electronically Signed   By: ARonney AstersM.D.   On: 09/25/2021 19:36    Procedures Procedures   Medications Ordered in ED Medications  cefTRIAXone (ROCEPHIN) 1 g in sodium chloride 0.9 % 100 mL IVPB (has no administration in time range)  metroNIDAZOLE (FLAGYL) IVPB 500 mg (has no administration in time range)  HYDROmorphone (DILAUDID) injection 1 mg (has no administration in time range)  potassium chloride 10 mEq in 100 mL IVPB (has no administration in time range)  promethazine (PHENERGAN) 12.5 mg in sodium chloride 0.9 % 50 mL IVPB (has no administration in time range)  heparin injection 5,000 Units (has no administration in time range)  ondansetron (ZOFRAN) injection 4 mg (4 mg Intravenous Given 09/25/21 1751)  sodium chloride 0.9 % bolus 1,000 mL (1,000 mLs Intravenous New Bag/Given (Non-Interop) 09/25/21 1750)  ibuprofen (ADVIL) tablet 800 mg (800 mg Oral Given 09/25/21 1752)  fentaNYL (SUBLIMAZE) injection 50 mcg (50 mcg Intravenous Given 09/25/21 1751)  HYDROmorphone (DILAUDID) injection 1 mg (1 mg Intravenous Given 09/25/21 1852)  iohexol (OMNIPAQUE) 350 MG/ML injection 70 mL (70 mLs Intravenous Contrast Given 09/25/21 1914)    ED Course  I have reviewed the triage vital signs and the nursing notes.  Pertinent labs & imaging results that were available during my care of the patient were reviewed by me and considered in my medical decision making (see chart for details).    MDM Rules/Calculators/A&P                           Pt with  diffuse colitis on CT scan.  Stool will be sent for cdiff and others, but she will be treated with rocephin/flagyl.  1st lactic is negative.  K is 3.2, so 10 meq Kcl ordered.  Mg level also ordered.  She is also slightly more anemic than usual (7.4 from 8.3 in August).  Cr is 1.64 today which is a little higher (1.55) than in August.  Blood cultures have been ordered.  Pt d/w Dr. [place] Crane (triad) for admission.  Final Clinical Impression(s) / ED Diagnoses Final diagnoses:  Colitis  Fever, unspecified fever cause  Anemia, unspecified type  Hypokalemia    Rx / DC Orders ED Discharge Orders     None        Isla Pence, MD 09/25/21 2027

## 2021-09-25 NOTE — ED Triage Notes (Signed)
Pt BIBA from home. Pt c/o chronic abdominal pain with worsening lately in the LLQ. Pt is compliant with crohn's meds.  Pt has had a fever since last dr's visit. Pt has been taking tylenol for "a while". Pt is extremely hot to the touch. Pt received 1041m tylenol PO en route. Pt is newly weak and needs assistance ambulating. Pt has had decreased PO intake.  Pt has a PICC line in leg that she receives meds through daily since August.

## 2021-09-25 NOTE — ED Notes (Signed)
Patient transported to CT 

## 2021-09-25 NOTE — H&P (Addendum)
History and Physical  Caitlin Peters XBM:[id number] DOB: 1961-01-31 DOA: 09/25/2021  Referring physician: Dr. Gilford Raid, EDP  PCP: [name], [name], MD  Outpatient Specialists: GI. Patient coming from: Home.  Chief Complaint: Fever and abdominal pain.  HPI: Caitlin Peters is a 60 y.o. female with medical history significant for Crohn's disease with multiple abdominal surgeries, short-bowel syndrome, postcholecystectomy, history of frequent hospitalization for line sepsis, chronic PICC line use due to TPN needs, essential hypertension, GERD, chronic anxiety/depression, seizure disorder, who presented to the [facility] ED due to worsening abdominal pain and fever x 1 day.  Associated with watery stools x4 today.  Denies blood in the stools.  No nausea or vomiting.  EMS was activated, received 1 g of Tylenol in route.  She was brought into the ED for further evaluation.  Upon presentation to the ED, febrile with T-max of 102.1, CT abdomen and pelvis with contrast showed diffuse colitis, mildly dilated small bowel loops may represent ileus or enteritis.  Developing partial small bowel obstruction not excluded.  There is new pneumobilia.  Blood cultures were obtained, patient was started on broad-spectrum empiric IV antibiotic Rocephin and IV Flagyl.  TRH, hospitalist team, was asked to admit.  ED Course:  T-max 102.1.  BP 169/68, pulse 63, respiratory 18, O2 saturation 98% on room air.  Lab studies remarkable for serum potassium 3.2, BUN 38, creatinine 1.64 from 1.55, 2 months ago, abdomen 2.5.  Hemoglobin 7.4, MCV 92, WBC 6.7, platelet 147.  Review of Systems: Review of systems as noted in the HPI. All other systems reviewed and are negative.   Past Medical History:  Diagnosis Date   Acute pancreatitis 04/13/2020   Anasarca 10/2019   AVN (avascular necrosis of bone) ([name])    Cataract    Choledocholithiasis (sludge) s/p ERCP 10/2019 10/21/2020   Chronic pain syndrome    CKD (chronic  kidney disease), stage III ([place])    Crohn disease ([place])    Crohn's disease of small & large intestine with SGS 1984   Caitlin Peters is a 60 year old female with a history of Crohn's disease diagnosed in 28 (age 62), history of long-term steroid use with osteoporosis, S/P multiple bowel resections ([id number]) complicated by chronic back and abdominal pain, steatorrhea and short bowel syndrome. The patient has been left with ~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 attached to proximal transverse colon through rectum. She has been   Depression    Diverticulosis    GERD (gastroesophageal reflux disease)    HTN (hypertension)    IDA (iron deficiency anemia)    Malnutrition ([place])    Mass in chest    Osteoporosis    Osteoporosis 12/24/2014   Pancreatitis    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; 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; jejunal to transverse colon anastomosis Treated at [facility] SB lengthening to 165cm Dr Alene Mires, [facility]   Vitamin B12 deficiency    Past Surgical History:  Procedure Laterality Date   ABDOMINAL ADHESION SURGERY  01/22/2018   [place]  11/26/2019   Procedure: BILIARY DILATION;  Surgeon: Jackquline Denmark, MD;  Location: [facility] ENDOSCOPY;  Service: Endoscopy;;   BILIARY DILATION  03/08/2020   Procedure: BILIARY DILATION;  Surgeon: Irving Copas., MD;  Location: [facility] ENDOSCOPY;  Service: Gastroenterology;;   BIOPSY  03/08/2020   Procedure: BIOPSY;  Surgeon: Justice Britain  Brooke Bonito., MD;  Location: [place];  Service: Gastroenterology;;   CHEST WALL RESECTION     right thoracotomy,resection of chest mass with anterior rib and reconstruction using prosthetic mesh and video arthroscopy   CHOLECYSTECTOMY  01/22/2018   COLONOSCOPY  2019   ENTEROSTOMY CLOSURE  04/1999   ERCP N/A 11/26/2019   Procedure: ENDOSCOPIC RETROGRADE CHOLANGIOPANCREATOGRAPHY  (ERCP);  Surgeon: Jackquline Denmark, MD;  Location: [facility];  Service: Endoscopy;  Laterality: N/A;   ERCP N/A 03/08/2020   Procedure: ENDOSCOPIC RETROGRADE CHOLANGIOPANCREATOGRAPHY (ERCP);  Surgeon: Irving Copas., MD;  Location: [place];  Service: Gastroenterology;  Laterality: N/A;   ESOPHAGOGASTRODUODENOSCOPY N/A 03/08/2020   Procedure: ESOPHAGOGASTRODUODENOSCOPY (EGD);  Surgeon: Irving Copas., MD;  Location: [place];  Service: Gastroenterology;  Laterality: N/A;   EUS N/A 03/08/2020   Procedure: UPPER ENDOSCOPIC ULTRASOUND (EUS) LINEAR;  Surgeon: Irving Copas., MD;  Location: [place];  Service: Gastroenterology;  Laterality: N/A;   ILEOCECETOMY  03/1999   ileocolon resection with abdominal stoma   ILEOSTOMY CLOSURE  2001   IR FLUORO GUIDE CV LINE LEFT  01/07/2020   IR FLUORO GUIDE CV LINE LEFT  03/09/2020   IR FLUORO GUIDE CV LINE LEFT  05/09/2020   IR FLUORO GUIDE CV LINE LEFT  07/20/2020   IR FLUORO GUIDE CV LINE RIGHT  08/05/2020   IR FLUORO GUIDE CV LINE RIGHT  04/03/2021   IR PTA VENOUS EXCEPT DIALYSIS CIRCUIT  01/07/2020   IR REMOVAL TUN CV CATH W/O FL  08/05/2020   IR REMOVAL TUN CV CATH W/O FL  07/11/2021   IR US GUIDE VASC ACCESS LEFT     x 2 06/17/19 and 09/14/2019   IR US GUIDE VASC ACCESS RIGHT  08/05/2020   KNEE SURGERY     right knee    LAPAROSCOPIC SMALL BOWEL RESECTION  2009   2000-2009.  SB resections for Crohns Disease - now with Short gut   OMENTECTOMY  01/22/2018   PARTIAL HYSTERECTOMY  1984   with LSO   REMOVAL OF STONES  11/26/2019   Procedure: REMOVAL OF STONES;  Surgeon: Jackquline Denmark, MD;  Location: [facility]OS[facility];  Service: Endoscopy;;   REMOVAL OF STONES  03/08/2020   Procedure: REMOVAL OF STONES;  Surgeon: Irving Copas., MD;  Location: [facility] ENDOSCOPY;  Service: Gastroenterology;;   SALPINGOOPHORECTOMY Left 1984   SALPINGOOPHORECTOMY Right 1990   SERIAL TRANSVERSE ENTEROPLASTY (STEP) - SMALL BOWEL LENGTHENING   01/22/2018   Dr Alene Mires, [facility] - SB length from 120 to 165cm    SMALL INTESTINE SURGERY  2002   SMALL INTESTINE SURGERY  2003   SPHINCTEROTOMY  11/26/2019   Procedure: SPHINCTEROTOMY;  Surgeon: Jackquline Denmark, MD;  Location: [facility] ENDOSCOPY;  Service: Endoscopy;;   TOTAL ABDOMINAL HYSTERECTOMY  1990   with RSO   UPPER GASTROINTESTINAL ENDOSCOPY      Social History:  reports that she has quit smoking. She has never used smokeless tobacco. She reports that she does not currently use alcohol. She reports that she does not use drugs.   Allergies  Allergen Reactions   Meperidine Hives    Other reaction(s): GI Upset Due to [place]    Hyoscyamine Hives and Swelling    Legs swelling Disorientation   Cefepime Other (See Comments)    Neurotoxicity occurring in setting of AKI. Ceftriaxone tolerated during same admit   Gabapentin Other (See Comments)    unknown   Lyrica [Pregabalin] Other (See Comments)    unknown   Topamax [  Topiramate] Other (See Comments)    unknown   Zosyn [Piperacillin Sod-Tazobactam So] Other (See Comments)    Patient reports it makes her vomit, her neck stiff, and her "heart feel funny"   Fentanyl Rash    Pt is allergic to fentanyl patch related to the glue (gives her a rash) Pt states she is NOT allergic to fentanyl IV medicine   Morphine And Related Rash    Family History  Problem Relation Age of Onset   Seizures Mother    Glaucoma Mother    CAD Father    Heart disease Father    Hypertension Father    Breast cancer Sister    Multiple sclerosis Sister    Diabetes Sister    Lupus Sister    Colon cancer Other    Crohn's disease Other       Prior to Admission medications   Medication Sig Start Date End Date Taking? Authorizing Provider  amLODipine (NORVASC) 10 MG tablet Take 1 tablet by mouth once daily 08/22/21   [name], [name], MD  budesonide (ENTOCORT EC) 3 MG 24 hr capsule Take 1 capsule (3 mg total) by mouth every 3 (three)  days. 06/30/21   Eugenie Filler, MD  buPROPion ER  [facility] SR) 100 MG 12 hr tablet Take 2 tablets by mouth once daily 08/30/21   [name], [name], MD  Calcium 200 MG TABS Take 200 mg by mouth daily.    [provider]  carvedilol (COREG) 6.25 MG tablet Take 1 tablet (6.25 mg total) by mouth 2 (two) times daily with a meal. 07/14/21   Nita Sells, MD  cholecalciferol (VITAMIN D3) 25 MCG (1000 UT) tablet Take 1,000 Units by mouth daily.     [provider]  cycloSPORINE (RESTASIS) 0.05 % ophthalmic emulsion Place 1 drop into both eyes 2 (two) times daily.    [provider]  DEXILANT 60 MG capsule Take 1 capsule by mouth once daily 08/01/21   [name], [name], MD  DULoxetine (CYMBALTA) 60 MG capsule Take 2 capsules (120 mg total) by mouth daily. 09/15/21   [name], [name], MD  estradiol (ESTRACE) 2 MG tablet Take 1 tablet by mouth once daily 07/27/21   [name], [name], MD  famotidine (PEPCID) 20 MG tablet Take 20 mg by mouth daily as needed for heartburn or indigestion.    [provider]  GATTEX 5 MG KIT Inject 1.5 mg into the skin daily. 01/03/21   [provider]  hydrALAZINE (APRESOLINE) 100 MG tablet Take 1 tablet (100 mg total) by mouth 3 (three) times daily. 06/30/21   Eugenie Filler, MD  HYDROmorphone (DILAUDID) 4 MG tablet Take 4 mg by mouth 4 (four) times daily as needed for moderate pain or severe pain. 04/18/21   [provider]  levETIRAcetam (KEPPRA) 500 MG tablet Take 1 tablet (500 mg total) by mouth 2 (two) times daily. 08/28/21   Cameron Sprang, MD  lidocaine (LIDODERM) 5 % Place 1 patch onto the skin daily. Remove & Discard patch within 12 hours or as directed by MD 06/30/21   Eugenie Filler, MD  lipase/protease/amylase (CREON) 36000 UNITS CPEP capsule Take 1 capsule (36,000 Units total) by mouth See admin instructions. 36000 units with meals Patient taking differently: Take 36,000  Units by mouth 3 (three) times daily with meals. 06/30/21   Eugenie Filler, MD  Menthol, Topical Analgesic, (BIOFREEZE EX) Apply 1 application topically as needed (pain).  [provider]  methadone (DOLOPHINE) 5 MG tablet Take 1 tablet (5 mg total) by mouth 5 (five) times daily. 06/30/20   Florencia Reasons, MD  methocarbamol (ROBAXIN) 500 MG tablet Take 1 tablet (500 mg total) by mouth in the morning and at bedtime. 06/13/21   [name], [name], MD  Multiple Vitamins-Minerals (MULTIVITAMIN ADULT PO) Take 1 tablet by mouth daily.    [provider]  NARCAN 4 MG/0.1ML LIQD nasal spray kit Place 1 spray into the nose once as needed (overdose). 10/14/20   [provider]  polyvinyl alcohol (LIQUIFILM TEARS) 1.4 % ophthalmic solution Place 1 drop into both eyes as needed for dry eyes.    [provider]  PRESCRIPTION MEDICATION Inject 1 each into the vein daily. Home TPN . [facility] . 1 bag for 12 hours. [phone]    [provider]  promethazine (PHENERGAN) 25 MG tablet TAKE 1 TABLET BY MOUTH EVERY 6 HOURS AS NEEDED FOR NAUSEA FOR VOMITING 09/22/21   [name], [name], MD  sodium chloride 0.9 % infusion Inject 1 mL into the vein daily as needed (flush). 10/16/20   [provider]  sucralfate (CARAFATE) 1 GM/10ML suspension Take 10 mLs (1 g total) by mouth 2 (two) times daily. 06/30/21   Eugenie Filler, MD  Trace Minerals Cu-Mn-Se-Zn (TRALEMENT IV) Inject 1 mL into the vein See admin instructions. Used in TPN bag 4 times weekly    [provider]  vitamin B-12 (CYANOCOBALAMIN) 1000 MCG tablet Take 1,000 mcg by mouth daily.     [provider]    Physical Exam: BP (!) 169/68   Pulse 72   Temp (!) 102.1 F (38.9 C) (Rectal)   Resp 17   Ht 5' 8"  (1.727 m)   Wt 62.1 kg   SpO2 95%   BMI 20.83 kg/m   General: 60 y.o. year-old female well developed well nourished in no acute  distress.  Alert and oriented x3. Cardiovascular: Regular rate and rhythm with no rubs or gallops.  No thyromegaly or JVD noted.  No lower extremity edema. 2/4 pulses in all 4 extremities. Respiratory: Clear to auscultation with no wheezes or rales. Good inspiratory effort. Abdomen: Soft diffuse tenderness nondistended with normal bowel sounds x4 quadrants. Muskuloskeletal: No cyanosis, clubbing or edema noted bilaterally Neuro: CN II-XII intact, strength, sensation, reflexes Skin: No ulcerative lesions noted or rashes.  PICC line located in right thigh. Psychiatry: Judgement and insight appear normal. Mood is appropriate for condition and setting          Labs on Admission:  Basic Metabolic Panel: Recent Labs  Lab 09/25/21 1754  NA 138  K 3.2*  CL 105  CO2 27  GLUCOSE 101*  BUN 38*  CREATININE 1.64*  CALCIUM 8.6*   Liver Function Tests: Recent Labs  Lab 09/25/21 1754  AST 17  ALT 18  ALKPHOS 117  BILITOT 0.7  PROT 6.0*  ALBUMIN 2.5*   Recent Labs  Lab 09/25/21 1754  LIPASE 24   No results for input(s): AMMONIA in the last 168 hours. CBC: Recent Labs  Lab 09/25/21 1754  WBC 6.7  NEUTROABS 5.6  HGB 7.4*  HCT 22.0*  MCV 92.4  PLT 147*   Cardiac Enzymes: No results for input(s): CKTOTAL, CKMB, CKMBINDEX, TROPONINI in the last 168 hours.  BNP (last 3 results) Recent Labs    05/06/21 2010  BNP 97.4    ProBNP (last 3 results)  No results for input(s): PROBNP in the last 8760 hours.  CBG: No results for input(s): GLUCAP in the last 168 hours.  Radiological Exams on Admission: CT Abdomen Pelvis W Contrast  Result Date: 09/25/2021 CLINICAL DATA:  Status post cholecystectomy. History of Crohn's disease. EXAM: CT ABDOMEN AND PELVIS WITH CONTRAST TECHNIQUE: Multidetector CT imaging of the abdomen and pelvis was performed using the standard protocol following bolus administration of intravenous contrast. CONTRAST:  70m OMNIPAQUE IOHEXOL 350 MG/ML SOLN  COMPARISON:  CT abdomen and pelvis 07/26/2021. FINDINGS: Lower chest: No acute abnormality. Hepatobiliary: Gallbladder surgically absent. There is mild intra and extrahepatic biliary ductal prominence which is stable. There is new pneumobilia in the common bile duct and left lobe of the liver. No focal liver lesion identified. Pancreas: There is mild prominence of the pancreatic duct which is unchanged. No focal pancreatic lesions are seen. Spleen: Normal in size without focal abnormality. Adrenals/Urinary Tract: Adrenal glands are unremarkable. Kidneys are normal, without renal calculi, focal lesion, or hydronephrosis. Bladder is unremarkable. Stomach/Bowel: Again seen are numerous postsurgical changes in the bowel. Colon demonstrates diffuse wall thickening with mild surrounding inflammation diffusely to the level rectum. Stomach is decompressed. There are prominent small bowel loops with air-fluid levels measuring up to 3.5 cm. No definite small bowel wall thickening identified. No pneumatosis or free air. Vascular/Lymphatic: No significant vascular findings are present. No enlarged abdominal or pelvic lymph nodes. Right femoral approach central venous catheter is again noted distal tip ending at the IVC/right atrial junction, unchanged. Reproductive: Status post hysterectomy. No adnexal masses. Other: No abdominal wall hernia or abnormality. No abdominopelvic ascites. Musculoskeletal: No acute or significant osseous findings. IMPRESSION: 1. Diffuse colitis. 2. Mildly dilated small bowel loops may represent ileus or enteritis. Developing partial small bowel obstruction not excluded. 3. There is new pneumobilia. Please correlate with surgical history of sphincterotomy. Electronically Signed   By: ARonney AstersM.D.   On: 09/25/2021 19:36    EKG: I independently viewed the EKG done and my findings are as followed: None available at the time of the visit.  Ordered and pending.  Assessment/Plan Present on  Admission:  Colitis  Active Problems:   Colitis  Diffuse Crohn's colitis, seen on CT abdomen and pelvis with contrast 09/25/2021. Presented with diffuse abdominal pain and a fever, T-max 102.4. Started on Rocephin and IV Flagyl in the ED, continue. Add prednisone 40 mg daily x 7 days Add gentle IV fluid hydration. Follow blood cultures x2 peripherally GI, Dr. GLyndel Safeconsulted via epic secure chat. Monitor fever curve and WBC.  Possible ileus versus enteritis/new pneumobilia IV fluid hydration Optimize electrolytes levels Consult general surgery in the morning  Chronic normocytic anemia Presented with hemoglobin of 7.4, no overt bleeding. Repeat CBC in the morning, transfuse hemoglobin less than 7.0 Per EDP FOBT negative  Acute thrombocytopenia Suspect secondary to acute illness Platelet count 147, monitor.  CKD 3B Appears to be close to her baseline creatinine 1.5 GFR 38 Presented with creatinine of 1.64 with GFR of 36 Closely monitor urine output with strict I's and O's Avoid nephrotoxic agents, dehydration and hypotension Repeat BMP in the morning.  Chronic TPN use Pharmacy consulted to resume home TPN  Essential hypertension BP stable Resume home oral antihypertensives Continue to monitor vital signs.  Seizure disorder Resume home Keppra Seizure precautions  Goals of care Palliative care team consulted to assist with establishing goals of care.  Generalized weakness/physical debility PT OT to assess Mobilize as tolerated with assistance. Fall precautions  Chronic pain syndrome on methadone Resume home regimen Continue to monitor on telemetry    Critical care time: 65 minutes.   DVT prophylaxis: Subcu heparin 5000 units 3 times daily.  Code Status: Full code  Family Communication: None at bedside  Disposition Plan: Admitted to stepdown unit  Consults called: Consult general surgery and GI in the morning.  Admission status: Inpatient status.   Patient will require at least 2 midnights for further evaluation and treatment of present condition.   Status is: Inpatient         Kayleen Memos MD [facility] Pager [phone]  If 7PM-7AM, please contact night-coverage [email or web address] Password [email or web address]  09/25/2021, 8:30 PM

## 2021-09-26 DIAGNOSIS — Z7189 Other specified counseling: Secondary | ICD-10-CM

## 2021-09-26 DIAGNOSIS — R509 Fever, unspecified: Secondary | ICD-10-CM

## 2021-09-26 DIAGNOSIS — D649 Anemia, unspecified: Secondary | ICD-10-CM

## 2021-09-26 DIAGNOSIS — Z515 Encounter for palliative care: Secondary | ICD-10-CM

## 2021-09-26 DIAGNOSIS — E876 Hypokalemia: Secondary | ICD-10-CM

## 2021-09-26 LAB — COMPREHENSIVE METABOLIC PANEL
ALT: 19 U/L (ref 0–44)
AST: 18 U/L (ref 15–41)
Albumin: 2.4 g/dL — ABNORMAL LOW (ref 3.5–5.0)
Alkaline Phosphatase: 118 U/L (ref 38–126)
Anion gap: 7 (ref 5–15)
BUN: 30 mg/dL — ABNORMAL HIGH (ref 6–20)
CO2: 26 mmol/L (ref 22–32)
Calcium: 8.1 mg/dL — ABNORMAL LOW (ref 8.9–10.3)
Chloride: 105 mmol/L (ref 98–111)
Creatinine, Ser: 1.56 mg/dL — ABNORMAL HIGH (ref 0.44–1.00)
GFR, Estimated: 38 mL/min — ABNORMAL LOW (ref >60)
Glucose, Bld: 84 mg/dL (ref 70–99)
Potassium: 3.4 mmol/L — ABNORMAL LOW (ref 3.5–5.1)
Sodium: 138 mmol/L (ref 135–145)
Total Bilirubin: 0.7 mg/dL (ref 0.3–1.2)
Total Protein: 5.5 g/dL — ABNORMAL LOW (ref 6.5–8.1)

## 2021-09-26 LAB — CBC
HCT: 21.9 % — ABNORMAL LOW (ref 36.0–46.0)
HCT: 22 % — ABNORMAL LOW (ref 36.0–46.0)
Hemoglobin: 7.1 g/dL — ABNORMAL LOW (ref 12.0–15.0)
Hemoglobin: 7.3 g/dL — ABNORMAL LOW (ref 12.0–15.0)
MCH: 30.7 pg (ref 26.0–34.0)
MCH: 31.2 pg (ref 26.0–34.0)
MCHC: 32.4 g/dL (ref 30.0–36.0)
MCHC: 33.2 g/dL (ref 30.0–36.0)
MCV: 94.8 fL (ref 80.0–100.0)
MCV: 94 fL (ref 80.0–100.0)
Platelets: 140 10*3/uL — ABNORMAL LOW (ref 150–400)
Platelets: 120 10*3/uL — ABNORMAL LOW (ref 150–400)
RBC: 2.31 MIL/uL — ABNORMAL LOW (ref 3.87–5.11)
RBC: 2.34 MIL/uL — ABNORMAL LOW (ref 3.87–5.11)
RDW: 15.9 % — ABNORMAL HIGH (ref 11.5–15.5)
RDW: 16.2 % — ABNORMAL HIGH (ref 11.5–15.5)
WBC: 5.4 10*3/uL (ref 4.0–10.5)
WBC: 5.6 10*3/uL (ref 4.0–10.5)
nRBC: 0 % (ref 0.0–0.2)
nRBC: 0 % (ref 0.0–0.2)

## 2021-09-26 LAB — MAGNESIUM
Magnesium: 1.7 mg/dL (ref 1.7–2.4)
Magnesium: 1.9 mg/dL (ref 1.7–2.4)

## 2021-09-26 LAB — HEMOGLOBIN A1C
Hgb A1c MFr Bld: 4.5 % — ABNORMAL LOW (ref 4.8–5.6)
Mean Plasma Glucose: 82.45 mg/dL

## 2021-09-26 LAB — TYPE AND SCREEN
ABO/RH(D): O POS
Antibody Screen: NEGATIVE

## 2021-09-26 LAB — GLUCOSE, CAPILLARY
Glucose-Capillary: 132 mg/dL — ABNORMAL HIGH (ref 70–99)
Glucose-Capillary: 194 mg/dL — ABNORMAL HIGH (ref 70–99)
Glucose-Capillary: 167 mg/dL — ABNORMAL HIGH (ref 70–99)

## 2021-09-26 LAB — C-REACTIVE PROTEIN: CRP: 8.6 mg/dL — ABNORMAL HIGH (ref <1.0)

## 2021-09-26 LAB — MRSA NEXT GEN BY PCR, NASAL: MRSA by PCR Next Gen: NOT DETECTED

## 2021-09-26 LAB — TRIGLYCERIDES: Triglycerides: 64 mg/dL (ref <150)

## 2021-09-26 LAB — PHOSPHORUS: Phosphorus: 2.7 mg/dL (ref 2.5–4.6)

## 2021-09-26 MED ORDER — LEVETIRACETAM IN NACL 500 MG/100ML IV SOLN
500.0000 mg | Freq: Two times a day (BID) | INTRAVENOUS | Status: DC
  Administered 2021-09-26 – 2021-09-29 (×8): 500 mg via INTRAVENOUS
  Filled 2021-09-26 (×9): qty 100

## 2021-09-26 MED ORDER — TRAVASOL 10 % IV SOLN
INTRAVENOUS | Status: AC
  Filled 2021-09-26: qty 900

## 2021-09-26 MED ORDER — METRONIDAZOLE 500 MG/100ML IV SOLN: 500.0000 mg | Freq: Two times a day (BID) | INTRAVENOUS | Status: DC

## 2021-09-26 MED ORDER — LACTATED RINGERS IV SOLN: INTRAVENOUS | Status: DC

## 2021-09-26 MED ORDER — DEXTROSE 10 % IV SOLN: INTRAVENOUS | Status: AC

## 2021-09-26 MED ORDER — INSULIN ASPART 100 UNIT/ML IJ SOLN: 0.0000 [IU] | Freq: Every day | INTRAMUSCULAR | Status: DC

## 2021-09-26 MED ORDER — PREDNISONE 20 MG PO TABS
40.0000 mg | ORAL_TABLET | Freq: Every day | ORAL | Status: DC
  Administered 2021-09-26: 40 mg via ORAL
  Filled 2021-09-26: qty 2

## 2021-09-26 MED ORDER — INSULIN ASPART 100 UNIT/ML IJ SOLN
0.0000 [IU] | Freq: Three times a day (TID) | INTRAMUSCULAR | Status: DC
  Administered 2021-09-26: 1 [IU] via SUBCUTANEOUS
  Administered 2021-09-26: 2 [IU] via SUBCUTANEOUS
  Administered 2021-09-26 – 2021-09-27 (×3): 1 [IU] via SUBCUTANEOUS

## 2021-09-26 MED ORDER — SODIUM CHLORIDE 0.9 % IV SOLN
2.0000 g | INTRAVENOUS | Status: DC
  Administered 2021-09-26 – 2021-09-27 (×2): 2 g via INTRAVENOUS
  Filled 2021-09-26 (×2): qty 20

## 2021-09-26 MED ORDER — HYDROMORPHONE HCL 1 MG/ML IJ SOLN
1.0000 mg | INTRAMUSCULAR | Status: AC | PRN
  Administered 2021-09-26 – 2021-09-27 (×6): 1 mg via INTRAVENOUS
  Filled 2021-09-26 (×6): qty 1

## 2021-09-26 MED ORDER — METRONIDAZOLE 500 MG/100ML IV SOLN
500.0000 mg | Freq: Two times a day (BID) | INTRAVENOUS | Status: DC
  Administered 2021-09-26 – 2021-09-27 (×4): 500 mg via INTRAVENOUS
  Filled 2021-09-26 (×3): qty 100

## 2021-09-26 MED ORDER — POTASSIUM CHLORIDE 10 MEQ/50ML IV SOLN
10.0000 meq | INTRAVENOUS | Status: AC
  Administered 2021-09-26 (×3): 10 meq via INTRAVENOUS
  Filled 2021-09-26 (×3): qty 50

## 2021-09-26 NOTE — Progress Notes (Signed)
Patient on home TPN--pharmacy consulted to resume while in hospital. Patient did not bring any TPN supplies with her to hospital and is uncertain of current rate.    She was on TPN @ 39m/hr when hospitalized in Aug 2022.  K 3.2-MD repleting Glu 101  Plan: Hang D10W @ 729mhr overnight Pharmacist to follow-up with home health to verify current home TPN prescription and order TPN bag to resume at 6p on 11/1. TPN labs ordered.   MiNetta CedarsPharmD, BCPS  09/26/2021@1 :22 AM

## 2021-09-26 NOTE — Progress Notes (Signed)
PHARMACY - TOTAL PARENTERAL NUTRITION CONSULT NOTE   Indication: Short bowel syndrome  Patient Measurements: Height: 5' 8"  (172.7 cm) Weight: 62.1 kg (137 lb) IBW/kg (Calculated) : 63.9 TPN AdjBW (KG): 62.1 Body mass index is 20.83 kg/m. 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  Glucose / Insulin: No hx DM, A1c 4.5 - prednisone 40m daily x 7 days added for colitis Electrolytes: K low 3.4 s/p 168m last night, all other WNL Renal: Hx CKDIII, SCr 1.56 near baseline of 1.5-1.6, BUN 30 Hepatic: LFTs WNL, Alb low 2.4 but near baseline Trig: 64 (11/1) WNL Intake / Output; MIVF: D10W at 7524mr, LR at 34m59m GI Imaging: - 10/31 CT abd/pelvis: diffuse colitis, mildly dilated small bowel loops may represent ileus or enteritis. Developing partial small bowel obstruction not excluded. There is new pneumobilia. GI Surgeries / Procedures:   Central access: PICC already in place TPN start date: continued from PTA  Home TPN regimen from [id number] 12hrs daily  - Provides 85 grams per day, 1088 kcal per day 4x/week - 20% SMOF lipids added 3x/week provides 85 grams protein per day, 1688.    Nutritional Goals: Goal TPN rate is 75 mL/hr (provides 90 g of protein and 1818 kcals per day)  RD Assessment:   On 07/11/2021: 1800-2000 kcal 90-105 grams protein  Current Nutrition:  Clear liquids  Plan:  Now: KCl 10meq58mx 3 runs  Resume TPN at 75mL/h45m 1800 (stop D10W infusion) Will use continuous administration while inpatient, acutely ill Electrolytes in TPN:  Na 100mEq/L24m 60mEq/L,56m 10mEq/L, 61m10mEq/L,  54m 20mmol/L.  30mc max acetate Patient receiving PO multivitamin so will not add to TPN Initiate Sensitive ACHS SSI and adjust as needed  Reduce MIVF to KVO mL/hr at 1800 Monitor TPN labs on Mon/Thurs, CMET, Mg, Phos in AM  Mckennon Zwart WilliamPeggyann JubaPS Pharmacy:  [phone] 11/[id number]:25 AM

## 2021-09-26 NOTE — Plan of Care (Signed)
  Problem: Education: Goal: Knowledge of General Education information will improve Description: Including pain rating scale, medication(s)/side effects and non-pharmacologic comfort measures Outcome: Progressing   Problem: Clinical Measurements: Goal: Respiratory complications will improve Outcome: Progressing   Problem: Elimination: Goal: Will not experience complications related to urinary retention Outcome: Progressing   Problem: Safety: Goal: Ability to remain free from injury will improve Outcome: Progressing

## 2021-09-26 NOTE — TOC Initial Note (Signed)
Transition of Care [facility]) - Initial/Assessment Note   Patient Details  Name: Caitlin Peters MRN: [id number] Date of Birth: 11-May-1961  Transition of Care [facility]) CM/SW Contact:    Sherie Don, LCSW Phone Number: 09/26/2021, 2:51 PM  Clinical Narrative: Readmission checklist completed due to high readmission score. CSW spoke with patient to complete assessment.  Per patient, she resides at home with her daughter and three grandchildren. Patient is independent with ADLs at baseline. Patient reported she is able to afford her medications each month. She uses [facility] or [facility] transport to get to medical appointments. Patient has a rolling walker and TPN supplies (pump, pole) at home.  Patient confirmed she is active with [facility] for TPN and [facility]. Pam with [employer] confirmed patient is active for TPN. TOC to follow for possible discharge needs.  Expected Discharge Plan: [place] Barriers to Discharge: Continued Medical Work up  Patient Goals and CMS Choice Patient states their goals for this hospitalization and ongoing recovery are:: Return home CMS Medicare.gov Compare Post Acute Care list provided to:: Patient Choice offered to / list presented to : Patient  Expected Discharge Plan and Services Expected Discharge Plan: [place] In-house Referral: Clinical Social Work Post Acute Care Choice: [place] arrangements for the past 2 months: Apartment             DME Arranged: N/A DME Agency: NA HH Arranged: RN, TPN HH Agency: Tour manager, Other - See comment ([place]) Representative spoke with at [place]: Patient already active with [place] for [place] for RN  Prior Living Arrangements/Services Living arrangements for the past 2 months: Apartment Lives with:: Adult Children, Relatives Patient language and need for interpreter reviewed:: Yes Do you feel safe going back to the place where you live?: Yes      Need for  Family Participation in Patient Care: Yes (Comment) Care giver support system in place?: Yes (comment) Current home services: DME, Home RN (Rolling walker, TPN supplies (pump, pole); Active w/[facility] for RN) Criminal Activity/Legal Involvement Pertinent to Current Situation/Hospitalization: No - Comment as needed  Activities of Daily Living Home Assistive Devices/Equipment: Environmental consultant (specify type), Eyeglasses, Other (Comment) (iv supllies at home for fluid and tpn) ADL Screening (condition at time of admission) Patient's cognitive ability adequate to safely complete daily activities?: Yes Is the patient deaf or have difficulty hearing?: Yes (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) Does the patient have difficulty seeing, even when wearing glasses/contacts?: No Does the patient have difficulty concentrating, remembering, or making decisions?: No Patient able to express need for assistance with ADLs?: Yes Does the patient have difficulty dressing or bathing?: No Independently performs ADLs?: Yes (appropriate for developmental age) (uses a walker when comes home from hospital and when goes out) Does the patient have difficulty walking or climbing stairs?: Yes Weakness of Legs: Both Weakness of Arms/Hands: Both  Emotional Assessment Attitude/Demeanor/Rapport: Engaged Affect (typically observed): Accepting Orientation: : Oriented to Self, Oriented to Place, Oriented to  Time, Oriented to Situation Alcohol / Substance Use: Not Applicable  Admission diagnosis:  Hypokalemia [E87.6] Colitis [K52.9] Fever, unspecified fever cause [R50.9] Anemia, unspecified type [D64.9] Patient Active Problem List   Diagnosis Date Noted   Vertebral osteomyelitis ([place])    Bloodstream infection due to central venous catheter    Thrombocytopenia ([name])    Malnutrition of moderate degree 07/06/2021   Transaminitis  Hypophosphatemia    Chronic idiopathic constipation    Left shoulder  pain 05/23/2021   Elevated LFTs    Sepsis ([place]) 05/06/2021   Acidosis 04/08/2021   Colitis 04/07/2021   Intractable nausea and vomiting 03/11/2021   Thoracic discitis 02/10/2021   Pressure injury of skin 12/18/2020   Bacteremia 12/17/2020   Seizures ([place]) 12/17/2020   Drug-seeking behavior 12/16/2020   Seizure ([place]) 12/15/2020   CKD (chronic kidney disease) stage 3, GFR 30-59 ml/min ([name]) 12/06/2020   COVID-19 virus infection 12/06/2020   Protein calorie malnutrition ([place]) 12/06/2020   Palpitations 11/22/2020   PAC (premature atrial contraction) 11/22/2020   History of central line-associated bloodstream infection (CLABSI) 10/21/2020   Nausea & vomiting 10/21/2020   Choledocholithiasis (sludge) s/p ERCP 10/2019 10/21/2020   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   Abdominal pain 54/65/6812   Acute metabolic encephalopathy    Hypomagnesemia    Partial small bowel obstruction ([name])    Bacteremia associated with intravascular line ([place]) 08/04/2020   Enterobacter sepsis ([place]) 07/22/2020   Fever of unknown origin 06/27/2020   Anxiety 06/03/2020   Acute pancreatitis 04/13/2020   Infection due to Acinetobacter species 03/10/2020   Pancytopenia ([place]) 03/05/2020   Central line infection    Elevated liver enzymes 01/02/2020   Cholangitis    Anasarca 10/28/2019   Acute kidney injury superimposed on chronic kidney disease ([place]) 10/28/2019   Falls 10/28/2019   Malnutrition ([place])    Vitamin B12 deficiency    GERD (gastroesophageal reflux disease)    Chronic pain syndrome    Hypokalemia due to excessive gastrointestinal loss of potassium 10/13/2019   Fever    Hypokalemia 10/12/2019   Chronic diarrhea 10/12/2019   Dysuria 10/12/2019   Bilateral lower extremity edema 10/12/2019   Hypertension 10/12/2019   AKI (acute kidney injury) ([place]) 10/12/2019   Candidemia ([place]) 08/27/2019   IDA (iron deficiency anemia) 11/03/2018   Dilation of biliary tract 08/28/2018   Severe diarrhea  03/07/2018   LFTs abnormal 01/27/2018   GI tract obstruction ([place]) 01/27/2018   Gram-negative bacteremia 09/13/2017   HTN (hypertension), benign 12/02/2016   Intractable pain 12/02/2016   Anemia 12/02/2016   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   Avascular necrosis ([place]) 06/14/2015   Polyarthralgia 05/03/2015   On total parenteral nutrition (TPN) 12/30/2014   Osteoporosis 12/24/2014   Low back pain 12/16/2014   Vitamin D deficiency 12/16/2014   Short gut syndrome 07/15/2014   History of colonic diverticulitis 2014   Depression 07/24/2012   Gram-positive bacteremia 07/24/2012   Small bowel obstruction due to adhesions ([place]) 07/24/2012   Diarrhea 12/13/2011   Neuralgia and neuritis 06/01/2011   Myalgia and myositis 08/12/2003   Crohn's disease of both small and large intestine with other complication ([place]) 7517   PCP:  [name], [name], MD Pharmacy:   [facility], [address] [place]. [place] [address] Phone: [phone] Fax: [phone]  Readmission Risk Interventions Readmission Risk Prevention Plan 09/26/2021 06/22/2021 02/10/2021  Transportation Screening Complete Complete Complete  PCP or Specialist Appt within 3-5 Days - - -  Not Complete comments - - -  HRI or [place] for [place] consult not completed comments - - -  Palliative Care Screening - - -  Medication Review Press photographer) Complete Complete Complete  PCP or Specialist appointment within 3-5 days of discharge - Complete -  [place]  or Home Care Consult Complete Complete Complete  SW Recovery Care/Counseling Consult Complete Complete Complete  Palliative Care Screening Not Applicable Not Applicable Not Applicable  Skilled Nursing Facility Not Applicable Not Applicable Not Applicable

## 2021-09-26 NOTE — Progress Notes (Signed)
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  No blood in the stools.  She was febrile in the ED at 102.1, and a CT scan of the abdomen and pelvis showed diffuse colitis and mildly dilated small bowel loops representing ileus versus enteritis.  There was also new pneumobilia.  Patient was placed on broad-spectrum antibiotics and was admitted to the hospital.  Subjective / 24h Interval events: Continues to have significant abdominal pain.  Asks for IV Dilaudid since she does not usually absorb oral medications well.  No diarrhea overnight  Assessment & Plan: Principal Problem Sepsis due to diffuse colitis-this is new, evidenced on the CT scan on 09/25/2021.  Patient was placed on antibiotics with ceftriaxone and metronidazole, continue.  She is definitely at risk for having C. difficile given multiple episodes of bacteremia in the past and various courses of antibiotics.  C. difficile and GI pathogen panel pending, her diarrhea seems to have resolved and her stool was unable to be sent -Initially started on steroids, discontinue today before we rule out an infectious process. -Continue otherwise supportive management with pain control, I put her back on IV Dilaudid over the next 24 hours with the expectation that she will improve and not require that furthermore  Active Problems Crohn's disease, short gut syndrome-continue TPN per pharmacy  Prior line associated infections, chronic PICC -cultures  obtained on admission, monitor.  Chronic normocytic anemia-hemoglobin in the 7 range, likely in the setting of chronic illness, monitor, no evidence of bleeding currently.  Transfuse for hemoglobin less than 7  Thrombocytopenia-chronic, due to chronic illness  Pneumobilia-unclear, I am not sure if she had recent instrumentation, defer to GI  Chronic kidney disease, stage IIIb-Baseline creatinine around 1.5, currently at baseline.  Essential hypertension-continue home medications  Seizure disorder-continue home Keppra  Chronic pain syndrome-on chronic methadone, will use limited IV pain medication in the setting of acute illness with the expectation that this is for short period of time   Scheduled Meds:  amLODipine  10 mg Oral Daily   buPROPion ER  200 mg Oral Daily   carvedilol  6.25 mg Oral BID WC   Chlorhexidine Gluconate Cloth  6 each Topical Daily   cholecalciferol  1,000 Units Oral Daily   cycloSPORINE  1 drop Both Eyes BID   DULoxetine  120 mg Oral Daily   heparin  5,000 Units Subcutaneous Q8H   hydrALAZINE  100 mg Oral TID   insulin aspart  0-5 Units Subcutaneous QHS   insulin aspart  0-9 Units Subcutaneous TID WC   lipase/protease/amylase  36,000 Units Oral TID WC   methadone  5 mg Oral 5 X Daily   multivitamin with minerals  1 tablet Oral Daily   pantoprazole  40 mg Oral Daily   sucralfate  1 g Oral BID   vitamin B-12  1,000 mcg Oral Daily   Continuous Infusions:  cefTRIAXone (ROCEPHIN)  IV     dextrose 75 mL/hr at 09/26/21 0249   lactated ringers 50 mL/hr at 09/26/21 0253   levETIRAcetam Stopped (  09/26/21 0205)   metronidazole Stopped (09/26/21 0240)   potassium chloride 10 mEq (09/26/21 1004)   TPN ADULT ([place])     PRN Meds:.acetaminophen, HYDROmorphone (DILAUDID) injection, melatonin, methocarbamol, ondansetron (ZOFRAN) IV, polyvinyl alcohol  Diet Orders (From admission, onward)     Start     Ordered   09/26/21 0027  Diet clear liquid Room service  appropriate? Yes; Fluid consistency: Thin  Diet effective now       Question Answer Comment  Room service appropriate? Yes   Fluid consistency: Thin      09/26/21 0026            DVT prophylaxis: heparin injection 5,000 Units Start: 09/25/21 2200     Code Status: Full Code  Family Communication: No family at bedside  Status is: Inpatient  Remains inpatient appropriate because: Ongoing work-up  Level of care: Stepdown  Consultants:  GI  Procedures:  None   Microbiology  Blood cultures 10/31 - pending  Antimicrobials: Ceftriaxone / Metronidazole 10/31 >>    Objective: Vitals:   09/26/21 0400 09/26/21 0500 09/26/21 0600 09/26/21 0710  BP: (!) 193/70 (!) 188/85 (!) 202/72   Pulse: (!) 135 65 71   Resp: (!) 21 14 (!) 23   Temp: (!) 100.6 F (38.1 C)   99.8 F (37.7 C)  TempSrc: Oral   Oral  SpO2: 95% 97% 98%   Weight:      Height:        Intake/Output Summary (Last 24 hours) at 09/26/2021 1107 Last data filed at 09/26/2021 0600 Gross per 24 hour  Intake 735.47 ml  Output 1500 ml  Net -764.53 ml   Filed Weights   09/25/21 1726  Weight: 62.1 kg    Examination:  Constitutional: NAD Eyes: no scleral icterus ENMT: Mucous membranes are moist.  Neck: normal, supple Respiratory: clear to auscultation bilaterally, no wheezing, no crackles. Normal respiratory effort. No accessory muscle use.  Cardiovascular: Regular rate and rhythm, no murmurs / rubs / gallops. No LE edema. Good peripheral pulses Abdomen: non distended, no tenderness. Bowel sounds positive.  Musculoskeletal: no clubbing / cyanosis.  Skin: no rashes Neurologic: CN 2-12 grossly intact. Strength 5/5 in all 4.  Psychiatric: Normal judgment and insight. Alert and oriented x 3. Normal mood.    Data Reviewed: I have independently reviewed following labs and imaging studies   CBC: Recent Labs  Lab 09/25/21 1754 09/25/21 2357 09/26/21 0232  WBC 6.7 5.4 5.6  NEUTROABS 5.6  --   --   HGB  7.4* 7.1* 7.3*  HCT 22.0* 21.9* 22.0*  MCV 92.4 94.8 94.0  PLT 147* 140* 196*   Basic Metabolic Panel: Recent Labs  Lab 09/25/21 1754 09/25/21 2357 09/26/21 0232  NA 138  --  138  K 3.2*  --  3.4*  CL 105  --  105  CO2 27  --  26  GLUCOSE 101*  --  84  BUN 38*  --  30*  CREATININE 1.64*  --  1.56*  CALCIUM 8.6*  --  8.1*  MG  --  1.7 1.9  PHOS  --   --  2.7   Liver Function Tests: Recent Labs  Lab 09/25/21 1754 09/26/21 0232  AST 17 18  ALT 18 19  ALKPHOS 117 118  BILITOT 0.7 0.7  PROT 6.0* 5.5*  ALBUMIN 2.5* 2.4*   Coagulation Profile: No results for input(s): INR, PROTIME in the last 168 hours. HbA1C: Recent Labs    09/26/21 0232  HGBA1C 4.5*   CBG: No results for input(s): GLUCAP in the last 168 hours.  Recent Results (from the past 240 hour(s))  Culture, blood (routine x 2)     Status: None (Preliminary result)   Collection Time: 09/25/21  5:56 PM   Specimen: BLOOD  Result Value Ref Range Status   Specimen Description   Final    BLOOD SITE NOT SPECIFIED Performed at [facility], [address], [place], [place] [address]    Special Requests   Final    BOTTLES DRAWN AEROBIC AND ANAEROBIC Blood Culture results may not be optimal due to an excessive volume of blood received in culture bottles Performed at [facility] [address] [place], [place] [address]    Culture  Setup Time PENDING  Incomplete   Culture   Final    NO GROWTH < 12 HOURS Performed at [facility], [place] [address] [place], [place] [address]    Report Status PENDING  Incomplete  Culture, blood (routine x 2)     Status: None (Preliminary result)   Collection Time: 09/25/21  6:05 PM   Specimen: BLOOD  Result Value Ref Range Status   Specimen Description   Final    BLOOD RIGHT WRIST Performed at [facility] [address], [place], [place] [address]    Special Requests   Final    BOTTLES DRAWN AEROBIC AND ANAEROBIC Blood  Culture adequate volume Performed at [facility] [address] [place], [place] [address]    Culture   Final    NO GROWTH < 12 HOURS Performed at [facility] [address], [place], [place] [address]    Report Status PENDING  Incomplete  Resp Panel by RT-PCR (Flu A&B, Covid) Nasopharyngeal Swab     Status: None   Collection Time: 09/25/21  8:54 PM   Specimen: Nasopharyngeal Swab; Nasopharyngeal(NP) swabs in vial transport medium  Result Value Ref Range Status   SARS Coronavirus 2 by RT PCR NEGATIVE NEGATIVE Final    Comment: (NOTE) SARS-CoV-2 target nucleic acids are NOT DETECTED.  The SARS-CoV-2 RNA is generally detectable in upper respiratory specimens during the acute phase of infection. The lowest concentration of SARS-CoV-2 viral copies this assay can detect is 138 copies/mL. A negative result does not preclude SARS-Cov-2 infection and should not be used as the sole basis for treatment or other patient management decisions. A negative result may occur with  improper specimen collection/handling, submission of specimen other than nasopharyngeal swab, presence of viral mutation(s) within the areas targeted by this assay, and inadequate number of viral copies(<138 copies/mL). A negative result must be combined with clinical observations, patient history, and epidemiological information. The expected result is Negative.  Fact Sheet for Patients:  EntrepreneurPulse.com.au  Fact Sheet for Healthcare Providers:  IncredibleEmployment.be  This test is no t yet approved or cleared by the [place] FDA and  has been authorized for detection and/or diagnosis of SARS-CoV-2 by FDA under an Emergency Use Authorization (EUA). This EUA will remain  in effect (meaning this test can be used) for the duration of the COVID-19 declaration under Section 564(b)(1) of the Act, 21 U.S.C.section 360bbb-3(b)(1), unless the  authorization is terminated  or revoked sooner.       Influenza A by PCR NEGATIVE NEGATIVE Final   Influenza B by PCR NEGATIVE NEGATIVE Final    Comment: (NOTE) The Xpert Xpress SARS-CoV-2/FLU/RSV plus assay is intended as an  aid in the diagnosis of influenza from Nasopharyngeal swab specimens and should not be used as a sole basis for treatment. Nasal washings and aspirates are unacceptable for Xpert Xpress SARS-CoV-2/FLU/RSV testing.  Fact Sheet for Patients: EntrepreneurPulse.com.au  Fact Sheet for Healthcare Providers: IncredibleEmployment.be  This test is not yet approved or cleared by the [place] FDA and has been authorized for detection and/or diagnosis of SARS-CoV-2 by FDA under an Emergency Use Authorization (EUA). This EUA will remain in effect (meaning this test can be used) for the duration of the COVID-19 declaration under Section 564(b)(1) of the Act, 21 U.S.C. section 360bbb-3(b)(1), unless the authorization is terminated or revoked.  Performed at [facility], [place] [address] [place], [place] [address]   MRSA Next Gen by PCR, Nasal     Status: None   Collection Time: 09/25/21 11:12 PM   Specimen: Nasal Mucosa; Nasal Swab  Result Value Ref Range Status   MRSA by PCR Next Gen NOT DETECTED NOT DETECTED Final    Comment: (NOTE) The GeneXpert MRSA Assay (FDA approved for NASAL specimens only), is one component of a comprehensive MRSA colonization surveillance program. It is not intended to diagnose MRSA infection nor to guide or monitor treatment for MRSA infections. Test performance is not FDA approved in patients less than 84 years old. Performed at [facility], [place] [address] [place], [place] [address]      Radiology Studies: CT Abdomen Pelvis W Contrast  Result Date: 09/25/2021 CLINICAL DATA:  Status post cholecystectomy. History of Crohn's disease. EXAM: CT ABDOMEN AND  PELVIS WITH CONTRAST TECHNIQUE: Multidetector CT imaging of the abdomen and pelvis was performed using the standard protocol following bolus administration of intravenous contrast. CONTRAST:  23m OMNIPAQUE IOHEXOL 350 MG/ML SOLN COMPARISON:  CT abdomen and pelvis 07/26/2021. FINDINGS: Lower chest: No acute abnormality. Hepatobiliary: Gallbladder surgically absent. There is mild intra and extrahepatic biliary ductal prominence which is stable. There is new pneumobilia in the common bile duct and left lobe of the liver. No focal liver lesion identified. Pancreas: There is mild prominence of the pancreatic duct which is unchanged. No focal pancreatic lesions are seen. Spleen: Normal in size without focal abnormality. Adrenals/Urinary Tract: Adrenal glands are unremarkable. Kidneys are normal, without renal calculi, focal lesion, or hydronephrosis. Bladder is unremarkable. Stomach/Bowel: Again seen are numerous postsurgical changes in the bowel. Colon demonstrates diffuse wall thickening with mild surrounding inflammation diffusely to the level rectum. Stomach is decompressed. There are prominent small bowel loops with air-fluid levels measuring up to 3.5 cm. No definite small bowel wall thickening identified. No pneumatosis or free air. Vascular/Lymphatic: No significant vascular findings are present. No enlarged abdominal or pelvic lymph nodes. Right femoral approach central venous catheter is again noted distal tip ending at the IVC/right atrial junction, unchanged. Reproductive: Status post hysterectomy. No adnexal masses. Other: No abdominal wall hernia or abnormality. No abdominopelvic ascites. Musculoskeletal: No acute or significant osseous findings. IMPRESSION: 1. Diffuse colitis. 2. Mildly dilated small bowel loops may represent ileus or enteritis. Developing partial small bowel obstruction not excluded. 3. There is new pneumobilia. Please correlate with surgical history of sphincterotomy. Electronically  Signed   By: ARonney AstersM.D.   On: 09/25/2021 19:36    CMarzetta Board MD, PhD [facility]  Between 7 am - 7 pm I am available, please contact me via Amion (for emergencies) or Securechat (non urgent messages)  Between 7 pm - 7 am I am not available, please contact night  coverage MD/APP via Amion

## 2021-09-26 NOTE — Progress Notes (Signed)
Patient says her stomach is upset and does not want to take any PO medications.

## 2021-09-26 NOTE — Progress Notes (Addendum)
Patient does not want PICC line dressing changed.  Patient stated it is changed every Thursday.  Dressing is clean and intact.  There is no Biopatch.  Patient said she does not use them.

## 2021-09-26 NOTE — Evaluation (Signed)
Physical Therapy Evaluation Patient Details Name: Caitlin Peters MRN: [id number] DOB: 1961/01/15 Today's Date: 09/26/2021  History of Present Illness  Caitlin Peters is a 60 y.o. female with medical history significant for Crohn's disease with multiple abdominal surgeries, short-bowel syndrome, postcholecystectomy, history of frequent hospitalization for line sepsis, chronic PICC line use due to TPN needs, essential hypertension, GERD, chronic anxiety/depression, seizure disorder, who presented to the [facility] ED 10/31/.22 due to worsening abdominal pain and fever x 1 day  Clinical Impression  The patient  reports independent at baseline, carries  her dog up and down a flight of steps 4 x's a day. Patient is alone for several hours a day. Patient uses rollator  out of home except walking the dog.  Patient's  BP 203/83.  Patient should progress to return home.  Pt admitted with above diagnosis.  Pt currently with functional limitations due to the deficits listed below (see PT Problem List). Pt will benefit from skilled PT to increase their independence and safety with mobility to allow discharge to the venue listed below.        Recommendations for follow up therapy are one component of a multi-disciplinary discharge planning process, led by the attending physician.  Recommendations may be updated based on patient status, additional functional criteria and insurance authorization.  Follow Up Recommendations No PT follow up    Assistance Recommended at Discharge None  Functional Status Assessment Patient has had a recent decline in their functional status and demonstrates the ability to make significant improvements in function in a reasonable and predictable amount of time.  Equipment Recommendations  None recommended by PT    Recommendations for Other Services       Precautions / Restrictions Precautions Precaution Comments: high BP      Mobility  Bed Mobility Overal bed mobility:  Modified Independent                  Transfers Overall transfer level: Needs assistance Equipment used: None Transfers: Sit to/from Stand;Stand Pivot Transfers Sit to Stand: Min guard Stand pivot transfers: Min guard         General transfer comment: stands and pivots to recliner, steady on feet.    Ambulation/Gait                Stairs            Wheelchair Mobility    Modified Rankin (Stroke Patients Only)       Balance Overall balance assessment: Mild deficits observed, not formally tested                                           Pertinent Vitals/Pain Pain Assessment: No/denies pain    Home Living Family/patient expects to be discharged to:: Private residence Living Arrangements: Other relatives Available Help at Discharge: Family;Available PRN/intermittently Type of Home: Apartment Home Access: Stairs to enter Entrance Stairs-Rails: Left Entrance Stairs-Number of Steps: 12   Home Layout: One level Home Equipment: Rollator (4 wheels);Rolling Walker (2 wheels) Additional Comments: grandchildren there after school    Prior Function Prior Level of Function : Independent/Modified Independent             Mobility Comments: holds dog and walks steps  up down to let the dog out 4 x's /day ADLs Comments: independnet     Hand Dominance   Dominant Hand: Right  Extremity/Trunk Assessment   Upper Extremity Assessment Upper Extremity Assessment: Overall WFL for tasks assessed    Lower Extremity Assessment Lower Extremity Assessment: Overall WFL for tasks assessed    Cervical / Trunk Assessment Cervical / Trunk Assessment: Normal  Communication   Communication: No difficulties  Cognition Arousal/Alertness: Awake/alert Behavior During Therapy: WFL for tasks assessed/performed Overall Cognitive Status: Within Functional Limits for tasks assessed                                           General Comments      Exercises     Assessment/Plan    PT Assessment Patient needs continued PT services  PT Problem List Decreased strength;Decreased mobility;Decreased activity tolerance       PT Treatment Interventions DME instruction;Therapeutic activities;Gait training;Therapeutic exercise;Patient/family education;Stair training;Functional mobility training    PT Goals (Current goals can be found in the Care Plan section)  Acute Rehab PT Goals Patient Stated Goal: to go home, walk the dog. PT Goal Formulation: With patient Time For Goal Achievement: 10/10/21 Potential to Achieve Goals: Good    Frequency Min 3X/week   Barriers to discharge        Co-evaluation               AM-PAC PT "6 Clicks" Mobility  Outcome Measure Help needed turning from your back to your side while in a flat bed without using bedrails?: None Help needed moving from lying on your back to sitting on the side of a flat bed without using bedrails?: None Help needed moving to and from a bed to a chair (including a wheelchair)?: None Help needed standing up from a chair using your arms (e.g., wheelchair or bedside chair)?: A Little Help needed to walk in hospital room?: A Little Help needed climbing 3-5 steps with a railing? : A Little 6 Click Score: 21    End of Session   Activity Tolerance: Patient tolerated treatment well;Patient limited by fatigue Patient left: in chair;with call bell/phone within reach;with nursing/sitter in room Nurse Communication: Mobility status PT Visit Diagnosis: Unsteadiness on feet (R26.81);Difficulty in walking, not elsewhere classified (R26.2)    Time: 7616-0737 PT Time Calculation (min) (ACUTE ONLY): 21 min   Charges:   PT Evaluation $PT Eval Low Complexity: [place] PT Acute Rehabilitation Services Pager [phone] Office [phone]   Claretha Cooper 09/26/2021, 10:17 AM

## 2021-09-26 NOTE — Consult Note (Addendum)
Referring Provider: Dr. Nevada Crane, [facility] Primary Care Physician:  [name], [name], MD Primary Gastroenterologist:  Dr. Domenica Fail at [facility]  Reason for Consultation:  Colitis/Crohn's disease  HPI: Caitlin Peters is a 60 y.o. female with a complicated past medical history.  She has Crohn's disease, diagnosed at age 2. 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 She has chronic diarrhea, malabsorption with B12 deficiency and iron deficiency requiring IV iron. She has had multiple hospital admissions for line infections / sepsis. 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 In addition to Crohn's related surgeries she has had a partial hysterectomy and cholecystectomy.    Patient established care with Dr. Tarri Glenn on 11/25/19 after relocating from [place] to be closer to her daughter.  She was referred to [facility] Intestinal Transplant program. Her malabsorption had previously been followed at the [facility].   Patient established care at [facility] 03/29/20 (Dr. Domenica Fail).  She was actually just seen there on 10/25 with extensive note in [place].  They are trying to get her in to be evaluated for small bowel transplant at [facility].  She presented to [facility] ED on 10/31 with worsening diarrhea, abdominal pain, and fever.  Symptoms worse than her baseline.  CT scan of the abdomen and pelvis with contrast showed the following on 10/31:  IMPRESSION: 1. Diffuse colitis. 2. Mildly dilated small bowel loops may represent ileus or enteritis. Developing partial small bowel obstruction not excluded. 3. There is new pneumobilia. Please correlate with surgical history of sphincterotomy.  CT scan in August showed colitis only in the descending colon.  She had been on  budesonide but that was discontinued some time in the summer.  Now only uses Lomotil and Imodium to control her diarrhea.   Cdiff and GI pathogen panel has been ordered but no stool collected yet as no BM since getting to her room.  IV rocephin and flagyl have been ordered as well has PO prednisone 40 mg.   Most recent colonoscopy done at [facility] December 2021 Patent end to side ileo-colonic anastomosis in transverse colon. It was patent, appeared healthy. Neo-terminal ileum appeared normal.     Diagnosis     A: Colon, ascending, biopsy - Colonic mucosa with no diagnostic alterations including no significant active inflammation and no evidence of dysplasia   B: Colon, descending, biopsy - Colonic mucosa with no diagnostic alterations including no significant active inflammation and no evidence of dysplasia       Past Medical History:  Diagnosis Date   Acute pancreatitis 04/13/2020   Anasarca 10/2019   AVN (avascular necrosis of bone) ([name])    Cataract    Choledocholithiasis (sludge) s/p ERCP 10/2019 10/21/2020   Chronic pain syndrome    CKD (chronic kidney disease), stage III ([place])    Crohn disease ([place])    Crohn's disease of small & large intestine with SGS 1984   Caitlin Peters is a 60 year old female with a history of Crohn's disease diagnosed in 1 (age 74), history of long-term steroid use with osteoporosis, S/P multiple bowel resections ([id number]) complicated by chronic back and abdominal pain, steatorrhea and short bowel syndrome. The patient has been left with ~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 attached to proximal transverse colon through rectum. She has been   Depression  Diverticulosis    GERD (gastroesophageal reflux disease)    HTN (hypertension)    IDA (iron deficiency anemia)    Malnutrition ([place])    Mass in chest    Osteoporosis    Osteoporosis 12/24/2014   Pancreatitis    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    Multiple SBR for  Crohn's 2000-2009; 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; jejunal to transverse colon anastomosis Treated at [place] SB lengthening to 165cm Dr Alene Mires, [facility]   Vitamin B12 deficiency     Past Surgical History:  Procedure Laterality Date   ABDOMINAL ADHESION SURGERY  01/22/2018   [facility]  11/26/2019   Procedure: BILIARY DILATION;  Surgeon: Jackquline Denmark, MD;  Location: [facility];  Service: Endoscopy;;   BILIARY DILATION  03/08/2020   Procedure: BILIARY DILATION;  Surgeon: Irving Copas., MD;  Location: [place];  Service: Gastroenterology;;   BIOPSY  03/08/2020   Procedure: BIOPSY;  Surgeon: Irving Copas., MD;  Location: [place];  Service: Gastroenterology;;   CHEST WALL RESECTION     right thoracotomy,resection of chest mass with anterior rib and reconstruction using prosthetic mesh and video arthroscopy   CHOLECYSTECTOMY  01/22/2018   COLONOSCOPY  2019   ENTEROSTOMY CLOSURE  04/1999   ERCP N/A 11/26/2019   Procedure: ENDOSCOPIC RETROGRADE CHOLANGIOPANCREATOGRAPHY (ERCP);  Surgeon: Jackquline Denmark, MD;  Location: [facility];  Service: Endoscopy;  Laterality: N/A;   ERCP N/A 03/08/2020   Procedure: ENDOSCOPIC RETROGRADE CHOLANGIOPANCREATOGRAPHY (ERCP);  Surgeon: Irving Copas., MD;  Location: [place];  Service: Gastroenterology;  Laterality: N/A;   ESOPHAGOGASTRODUODENOSCOPY N/A 03/08/2020   Procedure: ESOPHAGOGASTRODUODENOSCOPY (EGD);  Surgeon: Irving Copas., MD;  Location: [place];  Service: Gastroenterology;  Laterality: N/A;   EUS N/A 03/08/2020   Procedure: UPPER ENDOSCOPIC ULTRASOUND (EUS) LINEAR;  Surgeon: Irving Copas., MD;  Location: [place];  Service: Gastroenterology;  Laterality: N/A;   ILEOCECETOMY  03/1999   ileocolon resection with abdominal stoma   ILEOSTOMY CLOSURE  2001   IR FLUORO GUIDE CV LINE LEFT  01/07/2020   IR FLUORO GUIDE CV LINE LEFT   03/09/2020   IR FLUORO GUIDE CV LINE LEFT  05/09/2020   IR FLUORO GUIDE CV LINE LEFT  07/20/2020   IR FLUORO GUIDE CV LINE RIGHT  08/05/2020   IR FLUORO GUIDE CV LINE RIGHT  04/03/2021   IR PTA VENOUS EXCEPT DIALYSIS CIRCUIT  01/07/2020   IR REMOVAL TUN CV CATH W/O FL  08/05/2020   IR REMOVAL TUN CV CATH W/O FL  07/11/2021   IR US GUIDE VASC ACCESS LEFT     x 2 06/17/19 and 09/14/2019   IR US GUIDE VASC ACCESS RIGHT  08/05/2020   KNEE SURGERY     right knee    LAPAROSCOPIC SMALL BOWEL RESECTION  2009   2000-2009.  SB resections for Crohns Disease - now with Short gut   OMENTECTOMY  01/22/2018   PARTIAL HYSTERECTOMY  1984   with LSO   REMOVAL OF STONES  11/26/2019   Procedure: REMOVAL OF STONES;  Surgeon: Jackquline Denmark, MD;  Location: [facility];  Service: Endoscopy;;   REMOVAL OF STONES  03/08/2020   Procedure: REMOVAL OF STONES;  Surgeon: Irving Copas., MD;  Location: [place];  Service: Gastroenterology;;   SALPINGOOPHORECTOMY Left 1984   SALPINGOOPHORECTOMY Right 1990   SERIAL TRANSVERSE ENTEROPLASTY (STEP) - SMALL BOWEL LENGTHENING  01/22/2018   Dr Alene Mires, [place] length from 120 to 165cm    SMALL INTESTINE SURGERY  2002   SMALL INTESTINE SURGERY  2003   SPHINCTEROTOMY  11/26/2019   Procedure: SPHINCTEROTOMY;  Surgeon: Jackquline Denmark, MD;  Location: [facility];  Service: Endoscopy;;   TOTAL ABDOMINAL HYSTERECTOMY  1990   with RSO   UPPER GASTROINTESTINAL ENDOSCOPY      Prior to Admission medications   Medication Sig Start Date End Date Taking? Authorizing Provider  amLODipine (NORVASC) 10 MG tablet Take 1 tablet by mouth once daily Patient taking differently: Take 10 mg by mouth daily. 08/22/21  Yes [name], [name], MD  buPROPion [facility] SR) 100 MG 12 hr tablet Take 2 tablets by mouth once daily Patient taking differently: Take 200 mg by mouth daily. 08/30/21  Yes [name], [name], MD  Calcium 200 MG TABS Take 200 mg by  mouth daily.   Yes [provider]  carvedilol (COREG) 6.25 MG tablet Take 1 tablet (6.25 mg total) by mouth 2 (two) times daily with a meal. 07/14/21  Yes Nita Sells, MD  cholecalciferol (VITAMIN D3) 25 MCG (1000 UT) tablet Take 1,000 Units by mouth daily.    Yes [provider]  cycloSPORINE (RESTASIS) 0.05 % ophthalmic emulsion Place 1 drop into both eyes 2 (two) times daily.   Yes [provider]  DEXILANT 60 MG capsule Take 1 capsule by mouth once daily Patient taking differently: Take 60 mg by mouth daily. 08/01/21  Yes [name], [name], MD  DULoxetine (CYMBALTA) 60 MG capsule Take 2 capsules (120 mg total) by mouth daily. 09/15/21  Yes [name], [name], MD  estradiol (ESTRACE) 2 MG tablet Take 1 tablet by mouth once daily Patient taking differently: Take 2 mg by mouth daily. 07/27/21  Yes Erline Hau, MD  GATTEX 5 MG KIT Inject 1.5 mg into the skin daily. 01/03/21  Yes [provider]  hydrALAZINE (APRESOLINE) 100 MG tablet Take 1 tablet (100 mg total) by mouth 3 (three) times daily. 06/30/21  Yes Eugenie Filler, MD  HYDROmorphone (DILAUDID) 4 MG tablet Take 4 mg by mouth 4 (four) times daily as needed for moderate pain or severe pain. 04/18/21  Yes [provider]  levETIRAcetam (KEPPRA) 500 MG tablet Take 1 tablet (500 mg total) by mouth 2 (two) times daily. 08/28/21  Yes Cameron Sprang, MD  lipase/protease/amylase (CREON) 36000 UNITS CPEP capsule Take 1 capsule (36,000 Units total) by mouth See admin instructions. 36000 units with meals Patient taking differently: Take 36,000 Units by mouth 3 (three) times daily with meals. 06/30/21  Yes Eugenie Filler, MD  Menthol, Topical Analgesic, (BIOFREEZE EX) Apply 1 application topically as needed (pain).   Yes [provider]  methadone (DOLOPHINE) 5 MG tablet Take 1 tablet (5 mg total) by mouth 5 (five) times daily. 06/30/20  Yes Florencia Reasons, MD   methocarbamol (ROBAXIN) 500 MG tablet Take 1 tablet (500 mg total) by mouth in the morning and at bedtime. 06/13/21  Yes [name], [name], MD  Multiple Vitamins-Minerals (MULTIVITAMIN ADULT PO) Take 1 tablet by mouth daily.   Yes [provider]  polyvinyl alcohol (LIQUIFILM TEARS) 1.4 % ophthalmic solution Place 1 drop into both eyes as needed for dry eyes.   Yes [provider]  PRESCRIPTION MEDICATION Inject 1 each into the vein daily. Home TPN . [facility] [place] .  1 bag for 12 hours. [phone]   Yes [provider]  promethazine (PHENERGAN) 25 MG tablet TAKE 1 TABLET BY MOUTH EVERY 6 HOURS AS NEEDED FOR NAUSEA FOR VOMITING Patient taking differently: Take 25 mg by mouth every 6 (six) hours as needed for nausea (or vomiting). 09/22/21  Yes [name], [name], MD  sodium chloride 0.9 % infusion Inject 1,000 mLs into the vein daily. For chronic dehydration 10/16/20  Yes [provider]  sucralfate (CARAFATE) 1 GM/10ML suspension Take 10 mLs (1 g total) by mouth 2 (two) times daily. 06/30/21  Yes Eugenie Filler, MD  Trace Minerals Cu-Mn-Se-Zn (TRALEMENT IV) Inject 1 mL into the vein See admin instructions. Used in TPN bag 4 times weekly   Yes [provider]  vitamin B-12 (CYANOCOBALAMIN) 1000 MCG tablet Take 1,000 mcg by mouth daily.    Yes [provider]  famotidine (PEPCID) 20 MG tablet Take 20 mg by mouth daily as needed for heartburn or indigestion.    [provider]  lidocaine (LIDODERM) 5 % Place 1 patch onto the skin daily. Remove & Discard patch within 12 hours or as directed by MD Patient not taking: Reported on 09/25/2021 06/30/21   Eugenie Filler, MD  [facility] 4 MG/0.1ML LIQD nasal spray kit Place 1 spray into the nose once as needed (overdose). 10/14/20   [provider]    Current Facility-Administered Medications  Medication Dose Route Frequency Provider Last Rate  Last Admin   acetaminophen (TYLENOL) tablet 650 mg  650 mg Oral Q6H PRN Irene Pap N, DO       amLODipine (NORVASC) tablet 10 mg  10 mg Oral Daily Irene Pap N, DO       buPROPion ER [facility] SR) 12 hr tablet 200 mg  200 mg Oral Daily Hall, Carole N, DO       carvedilol (COREG) tablet 6.25 mg  6.25 mg Oral BID WC Hall, Carole N, DO   6.25 mg at 09/26/21 6168   cefTRIAXone (ROCEPHIN) 2 g in sodium chloride 0.9 % 100 mL IVPB  2 g Intravenous Q24H Hall, Carole N, DO       Chlorhexidine Gluconate Cloth 2 % PADS 6 each  6 each Topical Daily Irene Pap N, DO       cholecalciferol (VITAMIN D3) tablet 1,000 Units  1,000 Units Oral Daily Hall, Carole N, DO       cycloSPORINE (RESTASIS) 0.05 % ophthalmic emulsion 1 drop  1 drop Both Eyes BID Hall, Carole N, DO       dextrose 10 % infusion   Intravenous Continuous Thomes Lolling, RPH 75 mL/hr at 09/26/21 0249 New Bag at 09/26/21 0249   DULoxetine (CYMBALTA) DR capsule 120 mg  120 mg Oral Daily Irene Pap N, DO       heparin injection 5,000 Units  5,000 Units Subcutaneous Q8H Kayleen Memos, DO   5,000 Units at 09/26/21 3729   hydrALAZINE (APRESOLINE) tablet 100 mg  100 mg Oral TID Kayleen Memos, DO       HYDROmorphone (DILAUDID) injection 1 mg  1 mg Intravenous Q4H PRN Caren Griffins, MD       insulin aspart (novoLOG) injection 0-5 Units  0-5 Units Subcutaneous QHS Peggyann Juba R, RPH       insulin aspart (novoLOG) injection 0-9 Units  0-9 Units Subcutaneous TID WC Emiliano Dyer, RPH   1 Units at 09/26/21 0211   lactated ringers infusion  Intravenous Continuous Kayleen Memos, DO 50 mL/hr at 09/26/21 0253 New Bag at 09/26/21 0253   levETIRAcetam (KEPPRA) IVPB 500 mg/100 mL premix  500 mg Intravenous BID Kayleen Memos, DO   Stopped at 09/26/21 0205   lipase/protease/amylase (CREON) capsule 36,000 Units  36,000 Units Oral TID WC Kayleen Memos, DO   36,000 Units at 09/26/21 3428   melatonin tablet 3 mg  3 mg Oral QHS PRN Kayleen Memos, DO   3 mg at 09/26/21 0128   methadone (DOLOPHINE) tablet 5 mg  5 mg Oral 5 X Daily Irene Pap N, DO   5 mg at 09/26/21 7681   methocarbamol (ROBAXIN) tablet 500 mg  500 mg Oral Q8H PRN Irene Pap N, DO       metroNIDAZOLE (FLAGYL) IVPB 500 mg  500 mg Intravenous Q12H Kayleen Memos, DO   Stopped at 09/26/21 0240   multivitamin with minerals tablet 1 tablet  1 tablet Oral Daily Irene Pap N, DO       ondansetron [facility]) injection 4 mg  4 mg Intravenous Q6H PRN Irene Pap N, DO   4 mg at 09/25/21 2328   pantoprazole (PROTONIX) EC tablet 40 mg  40 mg Oral Daily Irene Pap N, DO       polyvinyl alcohol (LIQUIFILM TEARS) 1.4 % ophthalmic solution 1 drop  1 drop Both Eyes PRN Hall, Carole N, DO       potassium chloride 10 mEq in 50 mL *CENTRAL LINE* IVPB  10 mEq Intravenous Q1 Hr x 3 Emiliano Dyer, RPH 50 mL/hr at 09/26/21 0856 10 mEq at 09/26/21 0856   predniSONE (DELTASONE) tablet 40 mg  40 mg Oral Q breakfast Kayleen Memos, DO   40 mg at 09/26/21 1572   sucralfate (CARAFATE) 1 GM/10ML suspension 1 g  1 g Oral BID Irene Pap N, DO   1 g at 09/26/21 0128   vitamin B-12 (CYANOCOBALAMIN) tablet 1,000 mcg  1,000 mcg Oral Daily Irene Pap N, DO        Allergies as of 09/25/2021 - Review Complete 09/25/2021  Allergen Reaction Noted   Meperidine Hives 08/14/2011   Hyoscyamine Hives and Swelling 07/15/2014   Cefepime Other (See Comments) 11/27/2017   Gabapentin Other (See Comments) 10/13/2019   Lyrica [pregabalin] Other (See Comments) 10/13/2019   Topamax [topiramate] Other (See Comments) 10/13/2019   Zosyn [piperacillin sod-tazobactam so] Other (See Comments) 08/03/2020   Fentanyl Rash 10/12/2019   Morphine and related Rash 10/12/2019    Family History  Problem Relation Age of Onset   Seizures Mother    Glaucoma Mother    CAD Father    Heart disease Father    Hypertension Father    Breast cancer Sister    Multiple sclerosis Sister    Diabetes Sister    Lupus  Sister    Colon cancer Other    Crohn's disease Other     Social History   Socioeconomic History   Marital status: Single    Spouse name: Not on file   Number of children: Not on file   Years of education: Not on file   Highest education level: Not on file  Occupational History   Occupation: disabled  Tobacco Use   Smoking status: Former   Smokeless tobacco: Never  Vaping Use   Vaping Use: Never used  Substance and Sexual Activity   Alcohol use: Not Currently   Drug use: Never   Sexual activity: Yes  Other Topics Concern   Not on file  Social History Narrative   Right handed   One story apartment   No caffeine   Social Determinants of Health   Financial Resource Strain: Not on file  Food Insecurity: Not on file  Transportation Needs: Not on file  Physical Activity: Not on file  Stress: Not on file  Social Connections: Not on file  Intimate Partner Violence: Not on file    Review of Systems: ROS is O/W negative except as mentioned in HPI.  Physical Exam: Vital signs in last 24 hours: Temp:  [98.7 F (37.1 C)-102.1 F (38.9 C)] 99.8 F (37.7 C) (11/01 0710) Pulse Rate:  [57-135] 71 (11/01 0600) Resp:  [14-23] 23 (11/01 0600) BP: (141-211)/(57-85) 202/72 (11/01 0600) SpO2:  [95 %-99 %] 98 % (11/01 0600) Weight:  [62.1 kg] 62.1 kg (10/31 1726)   General:  Alert, thin, pleasant and cooperative in NAD Head:  Normocephalic and atraumatic. Eyes:  Sclera clear, no icterus.  Conjunctiva pink. Ears:  Normal auditory acuity. Mouth:  No deformity or lesions.   Lungs:  Clear throughout to auscultation.  No wheezes, crackles, or rhonchi.  Heart:  Regular rate and rhythm; no murmurs, clicks, rubs, or gallops. Abdomen:  Soft, non-distended.  BS present.  Multiple scars noted on abdomen.  Mild diffuse TTP.   Msk:  Symmetrical without gross deformities. Pulses:  Normal pulses noted. Extremities:  Without clubbing or edema.  Line noted on right thigh. Neurologic:   Alert and oriented x 4;  grossly normal neurologically. Skin:  Intact without significant lesions or rashes. Psych:  Alert and cooperative. Normal mood and affect.  Intake/Output from previous day: 10/31 0701 - 11/01 0700 In: 735.5 [I.V.:635.5; IV Piggyback:100] Out: 1500 [Urine:1500]  Lab Results: Recent Labs    09/25/21 1754 09/25/21 2357 09/26/21 0232  WBC 6.7 5.4 5.6  HGB 7.4* 7.1* 7.3*  HCT 22.0* 21.9* 22.0*  PLT 147* 140* 120*   BMET Recent Labs    09/25/21 1754 09/26/21 0232  NA 138 138  K 3.2* 3.4*  CL 105 105  CO2 27 26  GLUCOSE 101* 84  BUN 38* 30*  CREATININE 1.64* 1.56*  CALCIUM 8.6* 8.1*   LFT Recent Labs    09/26/21 0232  PROT 5.5*  ALBUMIN 2.4*  AST 18  ALT 19  ALKPHOS 118  BILITOT 0.7   Studies/Results: CT Abdomen Pelvis W Contrast  Result Date: 09/25/2021 CLINICAL DATA:  Status post cholecystectomy. History of Crohn's disease. EXAM: CT ABDOMEN AND PELVIS WITH CONTRAST TECHNIQUE: Multidetector CT imaging of the abdomen and pelvis was performed using the standard protocol following bolus administration of intravenous contrast. CONTRAST:  10m OMNIPAQUE IOHEXOL 350 MG/ML SOLN COMPARISON:  CT abdomen and pelvis 07/26/2021. FINDINGS: Lower chest: No acute abnormality. Hepatobiliary: Gallbladder surgically absent. There is mild intra and extrahepatic biliary ductal prominence which is stable. There is new pneumobilia in the common bile duct and left lobe of the liver. No focal liver lesion identified. Pancreas: There is mild prominence of the pancreatic duct which is unchanged. No focal pancreatic lesions are seen. Spleen: Normal in size without focal abnormality. Adrenals/Urinary Tract: Adrenal glands are unremarkable. Kidneys are normal, without renal calculi, focal lesion, or hydronephrosis. Bladder is unremarkable. Stomach/Bowel: Again seen are numerous postsurgical changes in the bowel. Colon demonstrates diffuse wall thickening with mild surrounding  inflammation diffusely to the level rectum. Stomach is decompressed. There are prominent small bowel loops with air-fluid levels measuring up to 3.5 cm.  No definite small bowel wall thickening identified. No pneumatosis or free air. Vascular/Lymphatic: No significant vascular findings are present. No enlarged abdominal or pelvic lymph nodes. Right femoral approach central venous catheter is again noted distal tip ending at the IVC/right atrial junction, unchanged. Reproductive: Status post hysterectomy. No adnexal masses. Other: No abdominal wall hernia or abnormality. No abdominopelvic ascites. Musculoskeletal: No acute or significant osseous findings. IMPRESSION: 1. Diffuse colitis. 2. Mildly dilated small bowel loops may represent ileus or enteritis. Developing partial small bowel obstruction not excluded. 3. There is new pneumobilia. Please correlate with surgical history of sphincterotomy. Electronically Signed   By: Ronney Asters M.D.   On: 09/25/2021 19:36    IMPRESSION:  *Hx Crohn's, s/p multiple SB surgeries w resulting short gut/malabsorption.  On TNA since 2003.  Colonoscopy, biopsies 10/2020 w/o active disease.  Chronic diarrhea on Gatex since 11/2020.  Working to get with [facility] for possible small bowel transplant.  Presented this time with worsening diarrhea and abdominal pain and fever, Tmax 102.4.  CT scan showing diffuse colitis, much worse compared to CT 8/31.  Stool studies pending.  She follows with [facility] intestinal transplant program, where she is established w Dr Domenica Fail. *Chronic pain:  Follows with pain clinic.  On methadone. *Chronic normocytic anemia:  Hgb stable, per notes--heme negative per EDP with no overt sign of bleeding.  Takes Vitamin B12 supplements and IV iron.  PLAN: -Agree with empiric abx (Rocephin and flagyl since she is allergic to Zosyn). -Stool studies pending.  Likely should rule out acute infection before giving steroids so will discontinue prednisone for  now. -Will trend daily CRP. -Most recently was treated with budesonide for her IBD but not on anything as of late.  Please reach out to her GI at [facility], Dr. Domenica Fail, to make them aware of her admission (just seen there on 10/25). -Blood cultures are pending but so far negative.  Please rule out other sources of infection as well as she has history of line infections, etc. -Clear liquid diet for now.   Laban Emperor. Yakelin Grenier  09/26/2021, 9:14 AM

## 2021-09-27 ENCOUNTER — Other Ambulatory Visit: Payer: Self-pay

## 2021-09-27 LAB — COMPREHENSIVE METABOLIC PANEL
ALT: 18 U/L (ref 0–44)
AST: 16 U/L (ref 15–41)
Albumin: 2.3 g/dL — ABNORMAL LOW (ref 3.5–5.0)
Alkaline Phosphatase: 116 U/L (ref 38–126)
Anion gap: 5 (ref 5–15)
BUN: 22 mg/dL — ABNORMAL HIGH (ref 6–20)
CO2: 29 mmol/L (ref 22–32)
Calcium: 8.6 mg/dL — ABNORMAL LOW (ref 8.9–10.3)
Chloride: 103 mmol/L (ref 98–111)
Creatinine, Ser: 1.5 mg/dL — ABNORMAL HIGH (ref 0.44–1.00)
GFR, Estimated: 40 mL/min — ABNORMAL LOW (ref >60)
Glucose, Bld: 129 mg/dL — ABNORMAL HIGH (ref 70–99)
Potassium: 3.5 mmol/L (ref 3.5–5.1)
Sodium: 137 mmol/L (ref 135–145)
Total Bilirubin: 0.3 mg/dL (ref 0.3–1.2)
Total Protein: 5.7 g/dL — ABNORMAL LOW (ref 6.5–8.1)

## 2021-09-27 LAB — URINE CULTURE: Culture: <10000 — AB

## 2021-09-27 LAB — CBC
HCT: UNDETERMINED % (ref 36.0–46.0)
HCT: 22.3 % — ABNORMAL LOW (ref 36.0–46.0)
Hemoglobin: UNDETERMINED g/dL (ref 12.0–15.0)
Hemoglobin: 7.5 g/dL — ABNORMAL LOW (ref 12.0–15.0)
MCH: UNDETERMINED pg (ref 26.0–34.0)
MCH: 31.4 pg (ref 26.0–34.0)
MCHC: UNDETERMINED g/dL (ref 30.0–36.0)
MCHC: 33.6 g/dL (ref 30.0–36.0)
MCV: UNDETERMINED fL (ref 80.0–100.0)
MCV: 93.3 fL (ref 80.0–100.0)
Platelets: UNDETERMINED 10*3/uL (ref 150–400)
Platelets: 140 10*3/uL — ABNORMAL LOW (ref 150–400)
RBC: UNDETERMINED MIL/uL (ref 3.87–5.11)
RBC: 2.39 MIL/uL — ABNORMAL LOW (ref 3.87–5.11)
RDW: UNDETERMINED % (ref 11.5–15.5)
RDW: 15.5 % (ref 11.5–15.5)
WBC: UNDETERMINED 10*3/uL (ref 4.0–10.5)
WBC: 4 10*3/uL (ref 4.0–10.5)
nRBC: UNDETERMINED % (ref 0.0–0.2)
nRBC: 0 % (ref 0.0–0.2)

## 2021-09-27 LAB — GASTROINTESTINAL PANEL BY PCR, STOOL (REPLACES STOOL CULTURE)

## 2021-09-27 LAB — URINALYSIS, ROUTINE W REFLEX MICROSCOPIC
Bilirubin Urine: NEGATIVE
Glucose, UA: NEGATIVE mg/dL
Hgb urine dipstick: NEGATIVE
Ketones, ur: NEGATIVE mg/dL
Leukocytes,Ua: NEGATIVE
Nitrite: NEGATIVE
Protein, ur: 30 mg/dL — AB
Specific Gravity, Urine: 1.006 (ref 1.005–1.030)
pH: 8 (ref 5.0–8.0)

## 2021-09-27 LAB — C DIFFICILE QUICK SCREEN W PCR REFLEX
C Diff antigen: NEGATIVE
C Diff interpretation: NOT DETECTED
C Diff toxin: NEGATIVE

## 2021-09-27 LAB — C-REACTIVE PROTEIN: CRP: 6.1 mg/dL — ABNORMAL HIGH (ref <1.0)

## 2021-09-27 LAB — GLUCOSE, CAPILLARY
Glucose-Capillary: 128 mg/dL — ABNORMAL HIGH (ref 70–99)
Glucose-Capillary: 116 mg/dL — ABNORMAL HIGH (ref 70–99)
Glucose-Capillary: 150 mg/dL — ABNORMAL HIGH (ref 70–99)
Glucose-Capillary: 135 mg/dL — ABNORMAL HIGH (ref 70–99)

## 2021-09-27 LAB — TRIGLYCERIDES
Triglycerides: UNDETERMINED mg/dL (ref <150)
Triglycerides: 45 mg/dL (ref <150)

## 2021-09-27 LAB — MAGNESIUM: Magnesium: 2.2 mg/dL (ref 1.7–2.4)

## 2021-09-27 LAB — PHOSPHORUS: Phosphorus: 2.9 mg/dL (ref 2.5–4.6)

## 2021-09-27 MED ORDER — TEDUGLUTIDE (RDNA) 5 MG ~~LOC~~ KIT
1.5000 mg | PACK | Freq: Every day | SUBCUTANEOUS | Status: DC
  Administered 2021-09-27 – 2021-09-29 (×3): 1.5 mg via SUBCUTANEOUS

## 2021-09-27 MED ORDER — TEDUGLUTIDE (RDNA) 5 MG ~~LOC~~ KIT
5.0000 mg | PACK | Freq: Every day | SUBCUTANEOUS | Status: DC
  Filled 2021-09-27: qty 5

## 2021-09-27 MED ORDER — SODIUM CHLORIDE 0.9 % IV SOLN
3.0000 g | Freq: Three times a day (TID) | INTRAVENOUS | Status: DC
  Administered 2021-09-27 – 2021-09-28 (×3): 3 g via INTRAVENOUS
  Filled 2021-09-27 (×4): qty 8

## 2021-09-27 MED ORDER — METHYLPREDNISOLONE SODIUM SUCC 40 MG IJ SOLR
20.0000 mg | Freq: Three times a day (TID) | INTRAMUSCULAR | Status: DC
  Administered 2021-09-27 – 2021-09-29 (×7): 20 mg via INTRAVENOUS
  Filled 2021-09-27 (×7): qty 1

## 2021-09-27 MED ORDER — TRAVASOL 10 % IV SOLN
INTRAVENOUS | Status: AC
  Filled 2021-09-27: qty 900

## 2021-09-27 MED ORDER — HYDROMORPHONE HCL 2 MG PO TABS
4.0000 mg | ORAL_TABLET | Freq: Four times a day (QID) | ORAL | Status: DC | PRN
  Administered 2021-09-27 – 2021-09-29 (×7): 4 mg via ORAL
  Filled 2021-09-27 (×7): qty 2

## 2021-09-27 MED ORDER — TEDUGLUTIDE (RDNA) 5 MG ~~LOC~~ KIT: 1.5000 mg | PACK | Freq: Every day | SUBCUTANEOUS | Status: DC

## 2021-09-27 NOTE — Evaluation (Signed)
Occupational Therapy Evaluation Patient Details Name: Caitlin Peters MRN: [id number] DOB: [birth year] Today's Date: 09/27/2021   History of Present Illness Caitlin Peters is a 60 y.o. female with medical history significant for Crohn's disease with multiple abdominal surgeries, short-bowel syndrome, postcholecystectomy, history of frequent hospitalization for line sepsis, chronic PICC line use due to TPN needs, essential hypertension, GERD, chronic anxiety/depression, seizure disorder, who presented to the [facility] ED 10/31/.22 due to worsening abdominal pain and fever x 1 day   Clinical Impression   Patient evaluated by Occupational Therapy with no further acute OT needs identified. All education has been completed and the patient has no further questions. Patient was MI for ADLs in room with IV pole on this date. Patient endorses being at baseline at this time.  See below for any follow-up Occupational Therapy or equipment needs. OT is signing off. Thank you for this referral.       Recommendations for follow up therapy are one component of a multi-disciplinary discharge planning process, led by the attending physician.  Recommendations may be updated based on patient status, additional functional criteria and insurance authorization.   Follow Up Recommendations  No OT follow up    Assistance Recommended at Discharge Intermittent Supervision/Assistance  Functional Status Assessment  Patient has not had a recent decline in their functional status  Equipment Recommendations  None recommended by OT    Recommendations for Other Services       Precautions / Restrictions Precautions Precautions: Fall Precaution Comments: high BP Restrictions Weight Bearing Restrictions: No      Mobility Bed Mobility Overal bed mobility: Modified Independent                  Transfers Overall transfer level: Modified independent Equipment used: None                      Balance  Overall balance assessment: Mild deficits observed, not formally tested                                         ADL either performed or assessed with clinical judgement   ADL Overall ADL's : Modified independent                                       General ADL Comments: patient is at baseline with MI for functional mobility in room with IV pole, LB dressing, and toileting tasks. patient endorses being at baseline at this time. patietn was eduated on ECT patient verbalized understanding     Vision Patient Visual Report: No change from baseline       Perception     Praxis      Pertinent Vitals/Pain Pain Assessment: No/denies pain     Hand Dominance Right   Extremity/Trunk Assessment Upper Extremity Assessment Upper Extremity Assessment: Overall WFL for tasks assessed   Lower Extremity Assessment Lower Extremity Assessment: Defer to PT evaluation   Cervical / Trunk Assessment Cervical / Trunk Assessment: Normal   Communication Communication Communication: No difficulties   Cognition Arousal/Alertness: Awake/alert Behavior During Therapy: WFL for tasks assessed/performed Overall Cognitive Status: Within Functional Limits for tasks assessed  General Comments  blood pressure: 174/74 mmhg  100% RA  HR: 75 bpm    Exercises     Shoulder Instructions      Home Living Family/patient expects to be discharged to:: Private residence Living Arrangements: Other relatives Available Help at Discharge: Family;Available PRN/intermittently Type of Home: Apartment Home Access: Stairs to enter Entrance Stairs-Number of Steps: 12 Entrance Stairs-Rails: Left Home Layout: One level     Bathroom Shower/Tub: Teacher, early years/pre: Standard Bathroom Accessibility: Yes   Home Equipment: Rollator (4 wheels);Rolling Walker (2 wheels)   Additional Comments: grandchildren there  after school      Prior Functioning/Environment Prior Level of Function : Independent/Modified Independent             Mobility Comments: holds dog and walks steps  up down to let the dog out 4 x's /day ADLs Comments: independent        OT Problem List:        OT Treatment/Interventions:      OT Goals(Current goals can be found in the care plan section) Acute Rehab OT Goals OT Goal Formulation: All assessment and education complete, DC therapy  OT Frequency:     Barriers to D/C:            Co-evaluation              AM-PAC OT "6 Clicks" Daily Activity     Outcome Measure Help from another person eating meals?: None Help from another person taking care of personal grooming?: None Help from another person toileting, which includes using toliet, bedpan, or urinal?: None Help from another person bathing (including washing, rinsing, drying)?: None Help from another person to put on and taking off regular upper body clothing?: None Help from another person to put on and taking off regular lower body clothing?: None 6 Click Score: 24   End of Session Nurse Communication: Other (comment) (cleared patient to paticipate in session)  Activity Tolerance: Patient tolerated treatment well Patient left: in bed;with call bell/phone within reach                   Time: 1420-1440 OT Time Calculation (min): 20 min Charges:  OT General Charges $OT Visit: 1 Visit OT Evaluation $OT Eval Low Complexity: 1 Low  Jackelyn Poling OTR/L, MS Acute Rehabilitation Department Office# [phone] Pager# [phone] 09/27/2021, 4:24 PM

## 2021-09-27 NOTE — Progress Notes (Signed)
PHARMACY - PHYSICIAN COMMUNICATION CRITICAL VALUE ALERT - BLOOD CULTURE IDENTIFICATION (BCID)  Caitlin Peters is an 60 y.o. female who presented to [facility] on 09/25/2021 with a chief complaint of colitis  Assessment:   short gut syndrome in chronic TPN. BCx obtained 10/31, no BCID reported, but cx grew on plate > reported as GNR in both bottles of one set only. Micro lab noted may be Acinetobacter - but cannot confirm until run on [employer] today 11/2, expect identification of organism 11/3.  Name of physician (or Provider) Contacted: Dr Avon Gully  Current antibiotics: Ceftriaxone/Flagyl  Changes to prescribed antibiotics recommended: per ID Rx, change to Unasyn till identification of organism.  Unasyn 3gm q8hr   Results for orders placed or performed during the hospital encounter of 07/05/21  Blood Culture ID Panel (Reflexed) (Collected: 07/09/2021  8:17 AM)  Result Value Ref Range   Enterococcus faecalis NOT DETECTED NOT DETECTED   Enterococcus Faecium NOT DETECTED NOT DETECTED   Listeria monocytogenes NOT DETECTED NOT DETECTED   Staphylococcus species NOT DETECTED NOT DETECTED   Staphylococcus aureus (BCID) NOT DETECTED NOT DETECTED   Staphylococcus epidermidis NOT DETECTED NOT DETECTED   Staphylococcus lugdunensis NOT DETECTED NOT DETECTED   Streptococcus species NOT DETECTED NOT DETECTED   Streptococcus agalactiae NOT DETECTED NOT DETECTED   Streptococcus pneumoniae NOT DETECTED NOT DETECTED   Streptococcus pyogenes NOT DETECTED NOT DETECTED   A.calcoaceticus-baumannii NOT DETECTED NOT DETECTED   Bacteroides fragilis NOT DETECTED NOT DETECTED   Enterobacterales NOT DETECTED NOT DETECTED   Enterobacter cloacae complex NOT DETECTED NOT DETECTED   Escherichia coli NOT DETECTED NOT DETECTED   Klebsiella aerogenes NOT DETECTED NOT DETECTED   Klebsiella oxytoca NOT DETECTED NOT DETECTED   Klebsiella pneumoniae NOT DETECTED NOT DETECTED   Proteus species NOT DETECTED NOT DETECTED    Salmonella species NOT DETECTED NOT DETECTED   Serratia marcescens NOT DETECTED NOT DETECTED   Haemophilus influenzae NOT DETECTED NOT DETECTED   Neisseria meningitidis NOT DETECTED NOT DETECTED   Pseudomonas aeruginosa NOT DETECTED NOT DETECTED   Stenotrophomonas maltophilia NOT DETECTED NOT DETECTED   Candida albicans NOT DETECTED NOT DETECTED   Candida auris NOT DETECTED NOT DETECTED   Candida glabrata NOT DETECTED NOT DETECTED   Candida krusei NOT DETECTED NOT DETECTED   Candida parapsilosis DETECTED (A) NOT DETECTED   Candida tropicalis NOT DETECTED NOT DETECTED   Cryptococcus neoformans/gattii NOT DETECTED NOT DETECTED    Nyoka Cowden, Normand Damron L 09/27/2021  1:18 PM

## 2021-09-27 NOTE — Progress Notes (Signed)
Pt home med, [facility], delivered to pharmacy.

## 2021-09-27 NOTE — Progress Notes (Signed)
Initial Nutrition Assessment  DOCUMENTATION CODES:   Non-severe (moderate) malnutrition in context of chronic illness  INTERVENTION:  - continue TPN regimen per Pharmacist.   NUTRITION DIAGNOSIS:   Moderate Malnutrition related to chronic illness as evidenced by mild fat depletion, mild muscle depletion.  GOAL:   Patient will meet greater than or equal to 90% of their needs  MONITOR:   PO intake, Labs, Weight trends, Other (Comment) (TPN regimen)  REASON FOR ASSESSMENT:   Consult New TPN/TNA  ASSESSMENT:   60 y.o. female with medical history of Crohn's disease with multiple abdominal surgeries, short-bowel syndrome, cholecystectomy, frequent hospitalizations for line sepsis, chronic PICC line use due to TPN, essential HTN, GERD, chronic anxiety and depression, and seizure disorder. She presented to the ED due to worsening abdominal pain and fever x1 day and watery stools x4 days. No blood in stools, no N/V. CT of the abdomen/pelvis with contrast showed diffuse colitis and mildly dilated small bowel loops. She was started on broad-spectrum empiric IV abx.  Patient laying in bed at the time of RD visit. She had walked in the hallway with RN shortly before that.   Diet advanced from CLD to Soft today at 1042. Ordered lunch per patient preference: chicken salad on a kaiser roll, baked potato chips, and a lemon lime soda.   Patient reports feeling hungry. She shares that she has been eating more at home and has been gaining weight. She has also been walking 3-4 times/day for exercise and to build strength and endurance.   Note from earlier today states gattex being delivered. Patient shares that her supply for this month is to be delivered.   Double lumen PICC in R femoral which was placed on 07/14/21. She is receiving TPN at 75 ml/hr which is providing 1818 kcal and 90 grams protein.   Patient shares that with increased PO intake she gained weight and had been weighing 137 lb.  This is the weight recorded from the ED on 10/31, appears to have been stated. Weight on 09/15/21 was 136 lb.    Labs reviewed; CBGs: 128 and 116 mg/dl, BUN: 22 mg/dl, creatinine: 1.5 mg/dl, Ca: 8.6 mg/dl, GFR: 40 ml/min.   Medications reviewed; 1000 units cholecalciferol/day, sliding scale novolog, 36000 units creon TID, 20 mg solu-medrol TID, 1 tablet multivitamin with minerals/day, 40 mg oral protonix/day, 1 g carafate TID, 1000 mcg oral cyanocobalamin/day.      NUTRITION - FOCUSED PHYSICAL EXAM:  Flowsheet Row Most Recent Value  Orbital Region Mild depletion  Upper Arm Region Mild depletion  Thoracic and Lumbar Region Unable to assess  Buccal Region No depletion  Temple Region No depletion  Clavicle Bone Region Mild depletion  Clavicle and Acromion Bone Region Mild depletion  Scapular Bone Region No depletion  Dorsal Hand No depletion  Patellar Region Moderate depletion  Anterior Thigh Region Moderate depletion  Posterior Calf Region No depletion  Edema (RD Assessment) Mild  [BLE]  Hair Reviewed  Eyes Reviewed  Mouth Reviewed  Skin Reviewed  Nails Reviewed       Diet Order:   Diet Order             DIET SOFT Room service appropriate? Yes; Fluid consistency: Thin  Diet effective now                   EDUCATION NEEDS:   No education needs have been identified at this time  Skin:  Skin Assessment: Reviewed RN Assessment  Last BM:  11/1 --  no additional documentation available on this  Height:   Ht Readings from Last 1 Encounters:  09/25/21 5' 8"  (1.727 m)    Weight:   Wt Readings from Last 1 Encounters:  09/25/21 62.1 kg     Estimated Nutritional Needs:  Kcal:  1860-2100 kcal Protein:  85-105 grams Fluid:  >/= 2 L/day     Jarome Matin, MS, RD, LDN, CNSC Inpatient Clinical Dietitian RD pager # available in [facility]  After hours/weekend pager # available in [facility]

## 2021-09-27 NOTE — Progress Notes (Signed)
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  No blood in the stools.  She was febrile in the ED at 102.1, and a CT scan of the abdomen and pelvis showed diffuse colitis and mildly dilated small bowel loops representing ileus versus enteritis.  There was also new pneumobilia.  Patient was placed on broad-spectrum antibiotics and was admitted to the hospital.   Assessment & Plan:   Sepsis due to diffuse colitis Rule out concurrent bacteremia Questionably concurrent Crohn's flare, short gut syndrome - Per CT scan on 09/25/2021 -Continue ceftriaxone, Flagyl for broad-spectrum coverage -Blood culture 1 out of 2 positive for gram-negative rod concerning for PICC line infection which will be recurrent given her history as above with chronic TPN -C Difficile panel negative -Continue supportive care, hold steroids in the setting of presumed infection, less likely Crohn's flare -Continue TPN - may need line replacement   Chronic normocytic/iron deficiency anemia -Likely iron deficiency in the setting of poor p.o. intake and short gut syndrome  -Baseline in the 7-9 range over the past few months   Thrombocytopenia-chronic, due to chronic illness/malnutrition   Severe protein caloric malnutrition in the setting of chronic short gut syndrome   Pneumobilia -Unclear etiology, continue to follow clinically   Chronic kidney disease, stage IIIb -Baseline creatinine around 1.5, currently at baseline.    Essential hypertension -Continue home medications  Seizure disorder- -Continue home Keppra   Chronic pain syndrome - On chronic methadone, avoid concurrent IV narcotics as possible, pain currently well controlled today  DVT prophylaxis: Heparin subcutaneous Code Status: Full Family Communication: None present  Status is: Inpatient  Dispo: The patient is from: Home              Anticipated d/c is to: Home              Anticipated d/c date is: 24 to 48 hours pending clinical course              Patient currently not medically stable for discharge  Consultants:  GI  Procedures:  None  Antimicrobials:  Ceftriaxone, Flagyl 09/25/2021, ongoing   Subjective: No acute issues or events overnight, pain currently well controlled, helping to increase p.o. intake today as well as activity but denies any overt chest pain shortness of breath nausea vomiting diarrhea constipation headache fevers chills or chest pain  Objective: Vitals:   09/27/21 0100 09/27/21 0200 09/27/21 0300 09/27/21 0600  BP: (!) 162/64 (!) 186/66 (!) 177/62   Pulse: 63 63 (!) 50   Resp: 11 12 13    Temp:   98.1 F (36.7 C) 98.2 F (36.8 C)  TempSrc:   Oral Oral  SpO2: 97% 99% 99%   Weight:      Height:        Intake/Output Summary (Last 24 hours) at 09/27/2021 0713 Last data filed at 09/26/2021 1800 Gross per 24 hour  Intake 1980.03 ml  Output --  Net 1980.03 ml   Filed Weights   09/25/21 1726  Weight: 62.1 kg    Examination:  General:  Pleasantly resting in bed, No acute distress. HEENT:  Normocephalic atraumatic.  Sclerae nonicteric, noninjected.  Extraocular movements intact bilaterally. Neck:  Without mass or deformity.  Trachea is midline. Lungs:  Clear to auscultate bilaterally without rhonchi, wheeze, or rales. Heart:  Regular rate and rhythm.  Without murmurs, rubs, or gallops. Abdomen:  Soft, nontender, nondistended.  Without guarding or rebound. Extremities: Without cyanosis,  clubbing, edema, or obvious deformity.  Right femoral PICC line Vascular:  Dorsalis pedis and posterior tibial pulses palpable bilaterally. Skin:  Warm and dry, no erythema.  Data Reviewed: I have personally reviewed following labs and imaging studies  CBC: Recent Labs  Lab 09/25/21 1754 09/25/21 2357 09/26/21 0232 09/27/21 0500  WBC 6.7 5.4 5.6 SPECIMEN CONTAMINATED, UNABLE TO PERFORM TEST(S).  NEUTROABS 5.6  --   --   --   HGB 7.4* 7.1* 7.3* SPECIMEN CONTAMINATED, UNABLE TO PERFORM TEST(S).  HCT 22.0* 21.9* 22.0* SPECIMEN CONTAMINATED, UNABLE TO PERFORM TEST(S).  MCV 92.4 94.8 94.0 SPECIMEN CONTAMINATED, UNABLE TO PERFORM TEST(S).  PLT 147* 140* 120* SPECIMEN CONTAMINATED, UNABLE TO PERFORM TEST(S).   Basic Metabolic Panel: Recent Labs  Lab 09/25/21 1754 09/25/21 2357 09/26/21 0232  NA 138  --  138  K 3.2*  --  3.4*  CL 105  --  105  CO2 27  --  26  GLUCOSE 101*  --  84  BUN 38*  --  30*  CREATININE 1.64*  --  1.56*  CALCIUM 8.6*  --  8.1*  MG  --  1.7 1.9  PHOS  --   --  2.7   GFR: Estimated Creatinine Clearance: 37.6 mL/min (A) (by C-G formula based on SCr of 1.56 mg/dL (H)). Liver Function Tests: Recent Labs  Lab 09/25/21 1754 09/26/21 0232  AST 17 18  ALT 18 19  ALKPHOS 117 118  BILITOT 0.7 0.7  PROT 6.0* 5.5*  ALBUMIN 2.5* 2.4*   Recent Labs  Lab 09/25/21 1754  LIPASE 24   No results for input(s): AMMONIA in the last 168 hours. Coagulation Profile: No results for input(s): INR, PROTIME in the last 168 hours. Cardiac Enzymes: No results for input(s): CKTOTAL, CKMB, CKMBINDEX, TROPONINI in the last 168 hours. BNP (last 3 results) No results for input(s): PROBNP in the last 8760 hours. HbA1C: Recent Labs    09/26/21 0232  HGBA1C 4.5*   CBG: Recent Labs  Lab 09/26/21 1233 09/26/21 1534 09/26/21 2123  GLUCAP 132* 194* 167*   Lipid Profile: Recent Labs    09/26/21 0232 09/27/21 0500  TRIG 64 SPECIMEN CONTAMINATED, UNABLE TO PERFORM  TEST(S).   Thyroid Function Tests: No results for input(s): TSH, T4TOTAL, FREET4, T3FREE, THYROIDAB in the last 72 hours. Anemia Panel: No results for input(s): VITAMINB12, FOLATE, FERRITIN, TIBC, IRON, RETICCTPCT in the last 72 hours. Sepsis Labs: Recent Labs  Lab 09/25/21 1754  LATICACIDVEN 0.8    Recent Results (from the past 240 hour(s))  Culture, blood (routine x 2)     Status: None (Preliminary result)   Collection Time: 09/25/21  5:56 PM   Specimen: BLOOD  Result Value Ref Range Status   Specimen Description   Final    BLOOD SITE NOT SPECIFIED Performed at [facility] [address], [place], [place] [address]    Special Requests   Final    BOTTLES DRAWN AEROBIC AND ANAEROBIC Blood Culture results may not be optimal due to an excessive volume of blood received in culture bottles Performed at [facility]  [place] [address], [place], [place] [address]    Culture  Setup Time PENDING  Incomplete   Culture   Final    NO GROWTH 2 DAYS Performed at [facility], [place] [address] [place], [place] [address]    Report Status PENDING  Incomplete  Culture, blood (routine x 2)     Status: None (Preliminary result)   Collection Time: 09/25/21  6:05 PM   Specimen: BLOOD  Result Value Ref Range Status   Specimen Description   Final    BLOOD RIGHT WRIST Performed at [facility] [address], [name], [place] [address]    Special Requests   Final    BOTTLES DRAWN AEROBIC AND ANAEROBIC Blood Culture adequate volume Performed at [facility] [address] [place], [place] [address]    Culture   Final    NO GROWTH 2 DAYS Performed at [facility] [address] [place], [place] [address]    Report Status PENDING  Incomplete  Resp Panel by RT-PCR (Flu A&B, Covid) Nasopharyngeal Swab     Status: None   Collection Time: 09/25/21  8:54 PM   Specimen: Nasopharyngeal Swab; Nasopharyngeal(NP) swabs in  vial transport medium  Result Value Ref Range Status   SARS Coronavirus 2 by RT PCR NEGATIVE NEGATIVE Final    Comment: (NOTE) SARS-CoV-2 target nucleic acids are NOT DETECTED.  The SARS-CoV-2 RNA is generally detectable in upper respiratory specimens during the acute phase of infection. The lowest concentration of SARS-CoV-2 viral copies this assay can detect is 138 copies/mL. A negative result does not preclude SARS-Cov-2 infection and should not be used as the sole basis for treatment or other patient management decisions. A negative result may occur with  improper specimen collection/handling, submission of specimen other than nasopharyngeal swab, presence of viral mutation(s) within the areas targeted by this assay, and inadequate number of viral copies(<138 copies/mL). A negative result must be combined with clinical observations, patient history, and epidemiological information. The expected result is Negative.  Fact Sheet for Patients:  EntrepreneurPulse.com.au  Fact Sheet for Healthcare Providers:  IncredibleEmployment.be  This test is no t yet approved or cleared by the [place] FDA and  has been authorized for detection and/or diagnosis of SARS-CoV-2 by FDA under an Emergency Use Authorization (EUA). This EUA will remain  in effect (meaning this test can be used) for the duration of the COVID-19 declaration under Section 564(b)(1) of the Act, 21 U.S.C.section 360bbb-3(b)(1), unless the authorization is terminated  or revoked sooner.       Influenza A by PCR NEGATIVE NEGATIVE Final   Influenza B by PCR NEGATIVE NEGATIVE Final    Comment: (NOTE) The Xpert Xpress SARS-CoV-2/FLU/RSV plus assay is intended as an aid in the diagnosis of influenza from Nasopharyngeal swab specimens and should not be used as a sole basis for treatment. Nasal washings and aspirates are unacceptable for Xpert Xpress SARS-CoV-2/FLU/RSV testing.  Fact  Sheet for Patients: EntrepreneurPulse.com.au  Fact Sheet for Healthcare Providers: IncredibleEmployment.be  This test is not yet approved or cleared by the [place] FDA and has been authorized for detection and/or diagnosis of SARS-CoV-2 by FDA under an Emergency Use Authorization (EUA). This EUA will remain in effect (meaning this test can be used) for the duration of the COVID-19 declaration under Section 564(b)(1) of the Act, 21 U.S.C. section 360bbb-3(b)(1), unless the authorization is terminated or revoked.  Performed at [facility], [address] [place], [place] [address]   MRSA Next Gen by PCR, Nasal     Status: None   Collection Time: 09/25/21 11:12 PM   Specimen: Nasal Mucosa; Nasal Swab  Result Value Ref Range Status   MRSA by PCR Next Gen NOT DETECTED NOT DETECTED Final    Comment: (NOTE) The GeneXpert MRSA Assay (FDA approved for NASAL specimens only), is one component of a comprehensive MRSA colonization surveillance program. It is not intended to diagnose MRSA infection nor to guide or monitor treatment for MRSA infections. Test performance is not FDA approved in patients less than 54 years old. Performed at [facility], [place] [address] [place], [place] [address]   C Difficile Quick Screen w PCR reflex     Status: None   Collection Time: 09/26/21  4:15 PM   Specimen: Stool  Result Value Ref Range Status   C Diff antigen NEGATIVE NEGATIVE Final   C Diff toxin NEGATIVE NEGATIVE Final   C Diff interpretation No C. difficile detected.  Final    Comment: Performed at [facility], [place] [address], [place], [place] [address]         Radiology Studies: CT Abdomen Pelvis W Contrast  Result Date: 09/25/2021 CLINICAL DATA:  Status post cholecystectomy. History of Crohn's disease. EXAM: CT ABDOMEN AND PELVIS WITH CONTRAST TECHNIQUE: Multidetector CT imaging of the  abdomen and pelvis was performed using the standard protocol following bolus administration of intravenous contrast. CONTRAST:  55m OMNIPAQUE IOHEXOL 350 MG/ML SOLN COMPARISON:  CT abdomen and pelvis 07/26/2021. FINDINGS: Lower chest: No acute abnormality. Hepatobiliary: Gallbladder surgically absent. There is mild intra and extrahepatic biliary ductal prominence which is stable. There is new pneumobilia in the common bile duct and left lobe of the liver. No focal liver lesion identified. Pancreas: There is mild prominence of the pancreatic duct which is unchanged. No focal pancreatic lesions are seen. Spleen: Normal in size without focal abnormality. Adrenals/Urinary Tract: Adrenal glands are unremarkable. Kidneys are normal, without renal calculi, focal lesion, or hydronephrosis. Bladder is unremarkable. Stomach/Bowel: Again seen are numerous postsurgical changes in the bowel. Colon demonstrates diffuse wall thickening with mild surrounding inflammation diffusely to the level rectum. Stomach is decompressed. There are prominent small bowel loops with air-fluid levels measuring up to 3.5 cm. No definite small bowel wall thickening identified. No pneumatosis or free air. Vascular/Lymphatic: No significant vascular findings are present. No enlarged abdominal or pelvic lymph nodes. Right femoral approach central venous catheter is again noted distal tip ending at the IVC/right atrial junction, unchanged. Reproductive: Status post hysterectomy. No adnexal masses. Other: No abdominal wall hernia or abnormality. No abdominopelvic ascites. Musculoskeletal: No acute or significant osseous findings. IMPRESSION: 1. Diffuse colitis. 2. Mildly dilated small bowel loops may represent ileus or enteritis. Developing partial small bowel obstruction not excluded. 3. There is new pneumobilia. Please correlate with surgical history of sphincterotomy. Electronically Signed   By: ARonney AstersM.D.   On: 09/25/2021 19:36         Scheduled Meds:  amLODipine  10 mg Oral Daily   buPROPion ER  200 mg Oral Daily   carvedilol  6.25 mg Oral BID WC   Chlorhexidine Gluconate Cloth  6 each Topical Daily   cholecalciferol  1,000 Units Oral Daily   cycloSPORINE  1 drop Both Eyes BID   DULoxetine  120 mg Oral Daily   heparin  5,000 Units Subcutaneous Q8H   hydrALAZINE  100 mg Oral TID  insulin aspart  0-5 Units Subcutaneous QHS   insulin aspart  0-9 Units Subcutaneous TID WC   lipase/protease/amylase  36,000 Units Oral TID WC   methadone  5 mg Oral 5 X Daily   multivitamin with minerals  1 tablet Oral Daily   pantoprazole  40 mg Oral Daily   sucralfate  1 g Oral BID   vitamin B-12  1,000 mcg Oral Daily   Continuous Infusions:  cefTRIAXone (ROCEPHIN)  IV Stopped (09/26/21 1205)   lactated ringers 10 mL/hr at 09/26/21 1753   levETIRAcetam Stopped (09/26/21 2340)   metronidazole Stopped (09/27/21 0245)   TPN ADULT (ION) 75 mL/hr at 09/26/21 1736     LOS: 2 days    Time spent: 55mn   Churchill Grimsley C Kindsey Eblin, DO [facility]  If 7PM-7AM, please contact night-coverage [email or web address]  09/27/2021, 7:13 AM

## 2021-09-27 NOTE — Consult Note (Signed)
Consultation Note Date: 09/27/2021   Patient Name: Caitlin Peters  DOB: 15-May-1961  MRN: [id number]  Age / Sex: 60 y.o., female  PCP: [name], [name], MD Referring Physician: Little Ishikawa, MD  Reason for Consultation: Establishing goals of care  HPI/Patient Profile: 60 y.o. 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   Clinical Assessment and Goals of Care: Palliative care consult received.  Chart reviewed including personal review of pertinent labs and imaging.  I know Caitlin Peters from prior encounter in the spring of this year.  I met today with Caitlin Peters.  She was sitting in bed in no distress time my encounter.  She reports feeling a little better than when she initially presented to the hospital.  She remembers vaguely discussing with me in spring and we talked again about her strong faith and how she gained strength to deal with her chronic illness through God's assistance.  We also discussed the importance of her family including her 2 daughters and a niece whom she is very close with.  She currently lives in [place] with her daughter and 3 grandchildren who are between 69 and 87 years of age.  We talked about her clinical course and recurrent admissions and she feels that she has good quality of life between flares and feels that the hospital continues to benefit her in enjoying quality time with her family.  She does follow with Dr. Domenica Fail at [facility] who has been working with her to be evaluated at  [facility] for potential small bowel transplant.  She is cautiously approaching this but is interested in any interventions to prolong her life or improve her quality of life.  SUMMARY OF RECOMMENDATIONS   - Full code/full scope - She tells me that her daughter is her healthcare power of attorney.  We do not have a copy of this on file.  She will look for the paperwork when she gets home so we can obtain a copy for future admissions. - Caitlin Peters remains invested in plan to continue with aggressive interventions.  While she is chronically ill, she has been dealing with this for decades and continues to enjoy spending time with her faith and family.  She feels that she has good quality of life outside of the hospital and plans to continue with returning to the hospital and aggressive care as needed in the future. -She follows with Dr. Domenica Fail at [facility] and is also an initial steps of being worked up for possible small bowel transplant at [facility]. -She is concerned about having adequate pain management while here in the hospital.  She follows with Dr. Kellie Shropshire as an outpatient.  She feels when she has a  flare, such as the one that brought her to the hospital, she needs to have IV pain medications for a few days "until things calm down" as her experience is that she does not absorb oral medications well due to short gut syndrome.  Code Status/Advance Care Planning: Full code  Symptom Management:  Pain management: Per primary.  One of her concerns is ensuring that she has adequate access to pain medications while in the hospital.  Palliative Prophylaxis:  Frequent Pain Assessment  Additional Recommendations (Limitations, Scope, Preferences): Full Scope Treatment  Psycho-social/Spiritual:  Desire for further Chaplaincy support: Did not address today Additional Recommendations: Caregiving  Support/Resources  Prognosis:  Unable to determine  Discharge Planning: To Be Determined       Primary Diagnoses: Present on Admission:  Colitis   I have reviewed the medical record, interviewed the patient and family, and examined the patient. The following aspects are pertinent.  Past Medical History:  Diagnosis Date   Acute pancreatitis 04/13/2020   Anasarca 10/2019   AVN (avascular necrosis of bone) ([name])    Cataract    Choledocholithiasis (sludge) s/p ERCP 10/2019 10/21/2020   Chronic pain syndrome    CKD (chronic kidney disease), stage III ([place])    Crohn disease ([place])    Crohn's disease of small & large intestine with SGS 1984   Caitlin Peters is a 60 year old female with a history of Crohn's disease diagnosed in 20 (age 54), history of long-term steroid use with osteoporosis, S/P multiple bowel resections ([id number]) complicated by chronic back and abdominal pain, steatorrhea and short bowel syndrome. The patient has been left with ~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 attached to proximal transverse colon through rectum. 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, Barrett deficiency    Social History   Socioeconomic History   Marital status: Single    Spouse name: Not on file   Number of children: Not on file   Years of education: Not on file   Highest education level: Not on file  Occupational History   Occupation: disabled  Tobacco Use   Smoking status: Former   Smokeless tobacco: Never  Scientific laboratory technician Use: Never used  Substance and Sexual Activity   Alcohol use: Not  Currently   Drug use: Never   Sexual activity: Yes  Other Topics Concern   Not on file  Social History Narrative   Right handed   One story apartment   No caffeine   Social Determinants of Health   Financial Resource Strain: Not on file  Food Insecurity: Not on file  Transportation Needs: Not on file  Physical Activity: Not on file  Stress: Not on file  Social Connections: Not on file   Family History  Problem Relation Age of Onset   Seizures Mother    Glaucoma Mother    CAD Father    Heart disease Father    Hypertension Father  Breast cancer Sister    Multiple sclerosis Sister    Diabetes Sister    Lupus Sister    Colon cancer Other    Crohn's disease Other    Scheduled Meds:  amLODipine  10 mg Oral Daily   buPROPion ER  200 mg Oral Daily   carvedilol  6.25 mg Oral BID WC   Chlorhexidine Gluconate Cloth  6 each Topical Daily   cholecalciferol  1,000 Units Oral Daily   cycloSPORINE  1 drop Both Eyes BID   DULoxetine  120 mg Oral Daily   heparin  5,000 Units Subcutaneous Q8H   hydrALAZINE  100 mg Oral TID   insulin aspart  0-5 Units Subcutaneous QHS   insulin aspart  0-9 Units Subcutaneous TID WC   lipase/protease/amylase  36,000 Units Oral TID WC   methadone  5 mg Oral 5 X Daily   multivitamin with minerals  1 tablet Oral Daily   pantoprazole  40 mg Oral Daily   sucralfate  1 g Oral BID   vitamin B-12  1,000 mcg Oral Daily   Continuous Infusions:  cefTRIAXone (ROCEPHIN)  IV Stopped (09/26/21 1205)   lactated ringers 10 mL/hr at 09/26/21 1753   levETIRAcetam Stopped (09/26/21 2340)   metronidazole Stopped (09/27/21 0245)   TPN ADULT (ION) 75 mL/hr at 09/26/21 1736   PRN Meds:.acetaminophen, HYDROmorphone (DILAUDID) injection, melatonin, methocarbamol, ondansetron (ZOFRAN) IV, polyvinyl alcohol Medications Prior to Admission:  Prior to Admission medications   Medication Sig Start Date End Date Taking? Authorizing Provider  amLODipine (NORVASC) 10 MG  tablet Take 1 tablet by mouth once daily Patient taking differently: Take 10 mg by mouth daily. 08/22/21  Yes [name], [name], MD  buPROPion ER [facility] SR) 100 MG 12 hr tablet Take 2 tablets by mouth once daily Patient taking differently: Take 200 mg by mouth daily. 08/30/21  Yes [name], [name], MD  Calcium 200 MG TABS Take 200 mg by mouth daily.   Yes [provider]  carvedilol (COREG) 6.25 MG tablet Take 1 tablet (6.25 mg total) by mouth 2 (two) times daily with a meal. 07/14/21  Yes Nita Sells, MD  cholecalciferol (VITAMIN D3) 25 MCG (1000 UT) tablet Take 1,000 Units by mouth daily.    Yes [provider]  cycloSPORINE (RESTASIS) 0.05 % ophthalmic emulsion Place 1 drop into both eyes 2 (two) times daily.   Yes [provider]  DEXILANT 60 MG capsule Take 1 capsule by mouth once daily Patient taking differently: Take 60 mg by mouth daily. 08/01/21  Yes [name], [name], MD  DULoxetine (CYMBALTA) 60 MG capsule Take 2 capsules (120 mg total) by mouth daily. 09/15/21  Yes [name], [name], MD  estradiol (ESTRACE) 2 MG tablet Take 1 tablet by mouth once daily Patient taking differently: Take 2 mg by mouth daily. 07/27/21  Yes Erline Hau, MD  GATTEX 5 MG KIT Inject 1.5 mg into the skin daily. 01/03/21  Yes [provider]  hydrALAZINE (APRESOLINE) 100 MG tablet Take 1 tablet (100 mg total) by mouth 3 (three) times daily. 06/30/21  Yes Eugenie Filler, MD  HYDROmorphone (DILAUDID) 4 MG tablet Take 4 mg by mouth 4 (four) times daily as needed for moderate pain or severe pain. 04/18/21  Yes [provider]  levETIRAcetam (KEPPRA) 500 MG tablet Take 1 tablet (500 mg total) by mouth 2 (two) times daily. 08/28/21  Yes Cameron Sprang, MD  lipase/protease/amylase (CREON) [phone] UNITS  CPEP capsule Take 1 capsule (36,000 Units total) by mouth See admin instructions. 36000 units with meals Patient taking  differently: Take 36,000 Units by mouth 3 (three) times daily with meals. 06/30/21  Yes Eugenie Filler, MD  Menthol, Topical Analgesic, (BIOFREEZE EX) Apply 1 application topically as needed (pain).   Yes [provider]  methadone (DOLOPHINE) 5 MG tablet Take 1 tablet (5 mg total) by mouth 5 (five) times daily. 06/30/20  Yes Florencia Reasons, MD  methocarbamol (ROBAXIN) 500 MG tablet Take 1 tablet (500 mg total) by mouth in the morning and at bedtime. 06/13/21  Yes [name], [name], MD  Multiple Vitamins-Minerals (MULTIVITAMIN ADULT PO) Take 1 tablet by mouth daily.   Yes [provider]  polyvinyl alcohol (LIQUIFILM TEARS) 1.4 % ophthalmic solution Place 1 drop into both eyes as needed for dry eyes.   Yes [provider]  PRESCRIPTION MEDICATION Inject 1 each into the vein daily. Home TPN . [facility] . 1 bag for 12 hours. [phone]   Yes [provider]  promethazine (PHENERGAN) 25 MG tablet TAKE 1 TABLET BY MOUTH EVERY 6 HOURS AS NEEDED FOR NAUSEA FOR VOMITING Patient taking differently: Take 25 mg by mouth every 6 (six) hours as needed for nausea (or vomiting). 09/22/21  Yes [name], [name], MD  sodium chloride 0.9 % infusion Inject 1,000 mLs into the vein daily. For chronic dehydration 10/16/20  Yes [provider]  sucralfate (CARAFATE) 1 GM/10ML suspension Take 10 mLs (1 g total) by mouth 2 (two) times daily. 06/30/21  Yes Eugenie Filler, MD  Trace Minerals Cu-Mn-Se-Zn (TRALEMENT IV) Inject 1 mL into the vein See admin instructions. Used in TPN bag 4 times weekly   Yes [provider]  vitamin B-12 (CYANOCOBALAMIN) 1000 MCG tablet Take 1,000 mcg by mouth daily.    Yes [provider]  famotidine (PEPCID) 20 MG tablet Take 20 mg by mouth daily as needed for heartburn or indigestion.    [provider]  lidocaine (LIDODERM) 5 % Place 1 patch onto the skin daily. Remove & Discard  patch within 12 hours or as directed by MD Patient not taking: Reported on 09/25/2021 06/30/21   Eugenie Filler, MD  [facility] 4 MG/0.1ML LIQD nasal spray kit Place 1 spray into the nose once as needed (overdose). 10/14/20   [provider]   Allergies  Allergen Reactions   Meperidine Hives    Other reaction(s): GI Upset Due to [place]    Hyoscyamine Hives and Swelling    Legs swelling Disorientation   Cefepime Other (See Comments)    Neurotoxicity occurring in setting of AKI. Ceftriaxone tolerated during same admit   Gabapentin Other (See Comments)    unknown   Lyrica [Pregabalin] Other (See Comments)    Has chronic dehydration and made it worse   Topamax [Topiramate] Other (See Comments)    Has chronic dehydration and made it worse   Zosyn [Piperacillin Sod-Tazobactam So] Other (See Comments)    Patient reports it makes her vomit, her neck stiff, and her "heart feel funny" Affected kidneys   Fentanyl Rash    Pt is allergic to fentanyl patch related to the glue (gives her a rash) Pt states she is NOT allergic to fentanyl IV medicine   Morphine And Related Rash   Review of Systems  Constitutional:  Positive for fatigue.  Gastrointestinal:  Positive for abdominal pain.  Neurological:  Positive for weakness.  Psychiatric/Behavioral:  Positive for sleep disturbance.    Physical Exam General: Alert, awake, in no acute distress.   HEENT: No bruits, no goiter, no JVD Heart: Regular rate and rhythm. No murmur appreciated. Abdomen: Soft, diffusely tender, nondistended, positive bowel sounds.   Ext: No significant edema Skin: Warm and dry Neuro: Grossly intact, nonfocal.   Vital Signs: BP (!) 189/67   Pulse 62   Temp 98.2 F (36.8 C) (Oral)   Resp 10   Ht 5' 8" (1.727 m)   Wt 62.1 kg   SpO2 100%   BMI 20.83 kg/m  Pain Scale: 0-10   Pain Score: 2    SpO2: SpO2: 100 % O2 Device:SpO2: 100 % O2 Flow Rate: .   IO: Intake/output summary:  Intake/Output  Summary (Last 24 hours) at 09/27/2021 0853 Last data filed at 09/27/2021 0800 Gross per 24 hour  Intake 1980.03 ml  Output 800 ml  Net 1180.03 ml    LBM: Last BM Date: 09/26/21 Baseline Weight: Weight: 62.1 kg Most recent weight: Weight: 62.1 kg     Palliative Assessment/Data:   Flowsheet Rows    Flowsheet Row Most Recent Value  Intake Tab   Referral Department Hospitalist  Unit at Time of Referral ICU  Palliative Care Primary Diagnosis Other (Comment)  [Gastroenterology]  Date Notified 09/25/21  Palliative Care Type Return patient Palliative Care  Reason for referral Clarify Goals of Care  Date of Admission 09/25/21  Date first seen by Palliative Care 09/26/21  # of days Palliative referral response time 1 Day(s)  # of days IP prior to Palliative referral 0  Clinical Assessment   Palliative Performance Scale Score 60%  Psychosocial & Spiritual Assessment   Palliative Care Outcomes   Patient/Family meeting held? Yes  Who was at the meeting? Patient  Palliative Care Outcomes Clarified goals of care       Time In: 1800 Time Out: 1920 Time Total: 80 minutes Greater than 50%  of this time was spent counseling and coordinating care related to the above assessment and plan.  Signed by: Micheline Rough, MD   Please contact Palliative Medicine Team phone at [phone] for questions and concerns.  For individual provider: See Shea Evans

## 2021-09-27 NOTE — Progress Notes (Signed)
[facility] Progress Note  CC:  Crohn's/colitis  Subjective:  Feeling better today.  Is getting dilaudid quite regularly per nursing staff.  Wants to eat.  Minimal stools and not watery.    Objective:  Vital signs in last 24 hours: Temp:  [98.1 F (36.7 C)-98.8 F (37.1 C)] 98.2 F (36.8 C) (11/02 0600) Pulse Rate:  [47-124] 62 (11/02 0700) Resp:  [10-18] 10 (11/02 0700) BP: (158-223)/(54-97) 189/67 (11/02 0700) SpO2:  [97 %-100 %] 100 % (11/02 0700) Last BM Date: 09/26/21 General:  Alert, thin, in NAD Heart:  Regular rate and rhythm; no murmurs Pulm:  CTAB.  No W/R/R. Abdomen:  Soft, non-distended.  BS present.  Mild diffuse TTP.  Multiple scars noted on abdomen. Extremities:  Without edema. Neurologic:  Alert and oriented x 4;  grossly normal neurologically.  Intake/Output from previous day: 11/01 0701 - 11/02 0700 In: 1980 [I.V.:1433.8; IV Piggyback:546.3] Out: -  Intake/Output this shift: Total I/O In: -  Out: 800 [Urine:800]  Lab Results: Recent Labs    09/26/21 0232 09/27/21 0500 09/27/21 0645  WBC 5.6 SPECIMEN CONTAMINATED, UNABLE TO PERFORM TEST(S). 4.0  HGB 7.3* SPECIMEN CONTAMINATED, UNABLE TO PERFORM TEST(S). 7.5*  HCT 22.0* SPECIMEN CONTAMINATED, UNABLE TO PERFORM TEST(S). 22.3*  PLT 120* SPECIMEN CONTAMINATED, UNABLE TO PERFORM TEST(S). 140*   BMET Recent Labs    09/25/21 1754 09/26/21 0232 09/27/21 0645  NA 138 138 137  K 3.2* 3.4* 3.5  CL 105 105 103  CO2 27 26 29   GLUCOSE 101* 84 129*  BUN 38* 30* 22*  CREATININE 1.64* 1.56* 1.50*  CALCIUM 8.6* 8.1* 8.6*   LFT Recent Labs    09/27/21 0645  PROT 5.7*  ALBUMIN 2.3*  AST 16  ALT 18  ALKPHOS 116  BILITOT 0.3   CT Abdomen Pelvis W Contrast  Result Date: 09/25/2021 CLINICAL DATA:  Status post cholecystectomy. History of Crohn's disease. EXAM: CT ABDOMEN AND PELVIS WITH CONTRAST TECHNIQUE: Multidetector CT imaging of the abdomen and pelvis was performed using the  standard protocol following bolus administration of intravenous contrast. CONTRAST:  1m OMNIPAQUE IOHEXOL 350 MG/ML SOLN COMPARISON:  CT abdomen and pelvis 07/26/2021. FINDINGS: Lower chest: No acute abnormality. Hepatobiliary: Gallbladder surgically absent. There is mild intra and extrahepatic biliary ductal prominence which is stable. There is new pneumobilia in the common bile duct and left lobe of the liver. No focal liver lesion identified. Pancreas: There is mild prominence of the pancreatic duct which is unchanged. No focal pancreatic lesions are seen. Spleen: Normal in size without focal abnormality. Adrenals/Urinary Tract: Adrenal glands are unremarkable. Kidneys are normal, without renal calculi, focal lesion, or hydronephrosis. Bladder is unremarkable. Stomach/Bowel: Again seen are numerous postsurgical changes in the bowel. Colon demonstrates diffuse wall thickening with mild surrounding inflammation diffusely to the level rectum. Stomach is decompressed. There are prominent small bowel loops with air-fluid levels measuring up to 3.5 cm. No definite small bowel wall thickening identified. No pneumatosis or free air. Vascular/Lymphatic: No significant vascular findings are present. No enlarged abdominal or pelvic lymph nodes. Right femoral approach central venous catheter is again noted distal tip ending at the IVC/right atrial junction, unchanged. Reproductive: Status post hysterectomy. No adnexal masses. Other: No abdominal wall hernia or abnormality. No abdominopelvic ascites. Musculoskeletal: No acute or significant osseous findings. IMPRESSION: 1. Diffuse colitis. 2. Mildly dilated small bowel loops may represent ileus or enteritis. Developing partial small bowel obstruction not excluded. 3. There is new pneumobilia. Please correlate with  surgical history of sphincterotomy. Electronically Signed   By: Ronney Asters M.D.   On: 09/25/2021 19:36    Assessment / Plan: *Hx Crohn's, s/p multiple SB  surgeries w resulting short gut/malabsorption.  On TNA since 2003.  Colonoscopy, biopsies 10/2020 w/o active disease.  Chronic diarrhea on Gatex since 11/2020.  Working to get with [facility] for possible small bowel transplant.  Presented this time with worsening diarrhea and abdominal pain and fever, Tmax 102.4.  CT scan showing diffuse colitis, much worse compared to CT 8/31.  Stool negative for Cdiff but GI pathogen panel is pending as well as fecal calprotectin.  CRP is elevated but is down slightly today at 6.1. She follows with [facility] intestinal transplant program, where she is established w Dr Domenica Fail.  Blood cultures negative thus far.  Has history of line infections. *Chronic pain:  Follows with pain clinic.  On methadone. *Chronic normocytic anemia:  Hgb stable, per notes--heme negative per EDP with no overt sign of bleeding.  Takes Vitamin B12 supplements and IV iron. *Pneumobilia:  Seen on imaging.  This is chronic seen on several imaging studies, dating back to 03/2020 after her ERCP in 02/2020.  LFTs are normal.  -Will restart steroids but IV form with solumedrol 20 mg three times daily since Cdiff is negative. -Await GI pathogen panel and fecal calprotectin. -Would consider completing infections work up with chest x-ray, urine culture, etc but will leave that to the hospitalist service as they see fit.  Urine study appears to be ordered but not collected--I discussed with her nurse about collecting this.  Can probably hold off on chest x-ray since no cough, etc present. -Continue empiric abx (rocephin and flagyl) for now. -Trend CRP. -Ok for soft diet (and lactose free which patient already does at home).    LOS: 2 days   Laban Emperor. Akemi Overholser  09/27/2021, 9:08 AM

## 2021-09-27 NOTE — Progress Notes (Signed)
PHARMACY - TOTAL PARENTER[facility] NUTRITION CONSULT NOTE   Indication: Short bowel syndrome  Patient Measurements: Height: 5' 8"  (172.7 cm) Weight: 62.1 kg (137 lb) IBW/kg (Calculated) : 63.9 TPN AdjBW (KG): 62.1 Body mass index is 20.83 kg/m. 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 Tx colitis  Glucose / Insulin: No hx DM, A1c 4.5 Prednisone ordered, stopped by GI - ordered CDiff > neg, using SoluM 88m tid CBGs 194 > 128, anticipate will increase with addition of steroid Electrolytes: lytes wnl Renal: Hx CKDIII, SCr 1.5 at baseline of 1.5-1.6, BUN decr 22 Hepatic: LFTs WNL, Alb low 2.4 but near baseline Trig: 64 (11/1) WNL Intake / Output; MIVF: LR at 151mhr, (stopped D10W infusion) GI Imaging: - 10/31 CT abd/pelvis: diffuse colitis, mildly dilated small bowel loops may represent ileus or enteritis. Developing partial small bowel obstruction not excluded. There is new pneumobilia. GI Surgeries / Procedures:   Central access: PICC already in place TPN start date: continued from PTA  Home TPN regimen from [id number] 12hrs daily  - Provides 85 grams per day, 1088 kcal per day 4x/week - 20% SMOF lipids added 3x/week provides 85 grams protein per day, 1688.    Nutritional Goals: Goal TPN rate is 75 mL/hr (provides 90 g of protein and 1818 kcals per day)  RD Assessment:   On 07/11/2021: 1800-2000 kcal 90-105 grams protein  Current Nutrition:  Clear liquids  Plan:   Resume TPN at 78m69m at 1800  Will use continuous administration while inpatient, acutely ill Electrolytes in TPN:  Na 100mE35m  K 60mEq54m Ca 10mEq/39mMg 10mEq/L24mhos 20mmol/L62ml:Ac max acetate Patient receiving PO multivitamin so will not add to TPN Chromium 10 mcg/day Initiate Sensitive ACHS SSI and adjust as needed  MIVF at KVO   [facility] TPN labs on Mon/Thurs  Rmani Kapusta, [facility]  [phone], 9:29 AM

## 2021-09-28 DIAGNOSIS — K912 Postsurgical malabsorption, not elsewhere classified: Secondary | ICD-10-CM

## 2021-09-28 DIAGNOSIS — R7881 Bacteremia: Secondary | ICD-10-CM | POA: Diagnosis not present

## 2021-09-28 DIAGNOSIS — K50118 Crohn's disease of large intestine with other complication: Secondary | ICD-10-CM

## 2021-09-28 DIAGNOSIS — T827XXA Infection and inflammatory reaction due to other cardiac and vascular devices, implants and grafts, initial encounter: Secondary | ICD-10-CM

## 2021-09-28 LAB — GLUCOSE, CAPILLARY
Glucose-Capillary: 119 mg/dL — ABNORMAL HIGH (ref 70–99)
Glucose-Capillary: 108 mg/dL — ABNORMAL HIGH (ref 70–99)
Glucose-Capillary: 141 mg/dL — ABNORMAL HIGH (ref 70–99)
Glucose-Capillary: 147 mg/dL — ABNORMAL HIGH (ref 70–99)

## 2021-09-28 LAB — CULTURE, BLOOD (ROUTINE X 2)

## 2021-09-28 LAB — COMPREHENSIVE METABOLIC PANEL
ALT: 18 U/L (ref 0–44)
AST: 15 U/L (ref 15–41)
Albumin: 2.3 g/dL — ABNORMAL LOW (ref 3.5–5.0)
Alkaline Phosphatase: 105 U/L (ref 38–126)
Anion gap: 6 (ref 5–15)
BUN: 28 mg/dL — ABNORMAL HIGH (ref 6–20)
CO2: 31 mmol/L (ref 22–32)
Calcium: 8.9 mg/dL (ref 8.9–10.3)
Chloride: 102 mmol/L (ref 98–111)
Creatinine, Ser: 1.2 mg/dL — ABNORMAL HIGH (ref 0.44–1.00)
GFR, Estimated: 52 mL/min — ABNORMAL LOW (ref >60)
Glucose, Bld: 132 mg/dL — ABNORMAL HIGH (ref 70–99)
Potassium: 3.9 mmol/L (ref 3.5–5.1)
Sodium: 139 mmol/L (ref 135–145)
Total Bilirubin: 0.3 mg/dL (ref 0.3–1.2)
Total Protein: 5.8 g/dL — ABNORMAL LOW (ref 6.5–8.1)

## 2021-09-28 LAB — PHOSPHORUS: Phosphorus: 3.6 mg/dL (ref 2.5–4.6)

## 2021-09-28 LAB — C-REACTIVE PROTEIN: CRP: 3.1 mg/dL — ABNORMAL HIGH (ref <1.0)

## 2021-09-28 LAB — CALPROTECTIN, FECAL: Calprotectin, Fecal: 118 ug/g (ref 0–120)

## 2021-09-28 LAB — MAGNESIUM: Magnesium: 2.4 mg/dL (ref 1.7–2.4)

## 2021-09-28 MED ORDER — TRAVASOL 10 % IV SOLN
INTRAVENOUS | Status: DC
  Filled 2021-09-28: qty 900

## 2021-09-28 MED ORDER — LEVOFLOXACIN IN D5W 500 MG/100ML IV SOLN
500.0000 mg | INTRAVENOUS | Status: DC
  Administered 2021-09-28: 500 mg via INTRAVENOUS
  Filled 2021-09-28: qty 100

## 2021-09-28 MED ORDER — HYDRALAZINE HCL 20 MG/ML IJ SOLN
10.0000 mg | Freq: Three times a day (TID) | INTRAMUSCULAR | Status: DC | PRN
  Administered 2021-09-28 – 2021-09-29 (×2): 10 mg via INTRAVENOUS
  Filled 2021-09-28 (×2): qty 1

## 2021-09-28 MED ORDER — SODIUM CHLORIDE 0.9% FLUSH
10.0000 mL | Freq: Two times a day (BID) | INTRAVENOUS | Status: DC
  Administered 2021-09-28 – 2021-09-29 (×3): 10 mL

## 2021-09-28 MED ORDER — HYDRALAZINE HCL 20 MG/ML IJ SOLN
5.0000 mg | Freq: Once | INTRAMUSCULAR | Status: AC | PRN
  Administered 2021-09-28: 5 mg via INTRAVENOUS
  Filled 2021-09-28: qty 1

## 2021-09-28 MED ORDER — SODIUM CHLORIDE 0.9% FLUSH: 10.0000 mL | INTRAVENOUS | Status: DC | PRN

## 2021-09-28 MED ORDER — CARVEDILOL 12.5 MG PO TABS
12.5000 mg | ORAL_TABLET | Freq: Two times a day (BID) | ORAL | Status: DC
  Administered 2021-09-28 – 2021-09-29 (×2): 12.5 mg via ORAL
  Filled 2021-09-28 (×2): qty 1

## 2021-09-28 NOTE — Progress Notes (Signed)
PHARMACY - TOTAL PARENTERAL NUTRITION CONSULT NOTE   Indication: Short bowel syndrome  Patient Measurements: Height: 5' 8"  (172.7 cm) Weight: 62.1 kg (137 lb) IBW/kg (Calculated) : 63.9 TPN AdjBW (KG): 62.1 Body mass index is 20.83 kg/m. 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 Tx colitis  Glucose / Insulin: No hx DM, A1c 4.5 Prednisone ordered, stopped by GI - ordered CDiff > neg, using SoluM 42m tid CBGs 116 > 150, no real increase with addition of steroid Electrolytes: lytes wnl Renal: Hx CKDIII, SCr 1.5 at baseline of 1.5-1.6, BUN decr 22 Hepatic: LFTs WNL, Alb low 2.4 but near baseline Trig: 64 (11/1) WNL Intake / Output; MIVF: LR at 148mhr, (stopped D10W infusion) GI Imaging: - 10/31 CT abd/pelvis: diffuse colitis, mildly dilated small bowel loops may represent ileus or enteritis. Developing partial small bowel obstruction not excluded. There is new pneumobilia. GI Surgeries / Procedures:   Central access: PICC already in place TPN start date: continued from PTA  Home TPN regimen from [facility]180056mver 12hrs daily  - Provides 85 grams per day, 1088 kcal per day 4x/week - 20% SMOF lipids added 3x/week provides 85 grams protein per day, 1688.    Nutritional Goals: Goal TPN rate is 75 mL/hr (provides 90 g of protein and 1818 kcals per day)  RD Assessment: Estimated Needs Total Energy Estimated Needs: 1860-2100 kcal Total Protein Estimated Needs: 85-105 grams Total Fluid Estimated Needs: >/= 2 L/day On 07/11/2021: 1800-2000 kcal 90-105 grams protein  Current Nutrition: soft diet, increased oral intake per RD  Plan:   Continue TPN at 67m55m at 1800  Will use continuous administration while inpatient, acutely ill Electrolytes in TPN:  Na 100mE31m  K 60mEq59m Ca 10mEq/48mDecrease Mg to 8mEq/L,67mhos 20mmol/L65ml:Ac adj to 1:2 Patient receiving PO multivitamin so  will not add to TPN Chromium 10 mcg/day Initiate Sensitive ACHS SSI and adjust as needed  MIVF at KVO   Mon[place]Mon/Thurs, BMET, Mag, Phos[place]/4  Mariesha Venturella, TeMinda DittoL Rx [id number] [phone], 7:45 AM

## 2021-09-28 NOTE — Consult Note (Addendum)
[facility] for Infectious Disease       Reason for Consult: bacteremia    Referring Physician: Dr. Avon Gully  Active Problems:   Colitis    amLODipine  10 mg Oral Daily   buPROPion ER  200 mg Oral Daily   carvedilol  6.25 mg Oral BID WC   Chlorhexidine Gluconate Cloth  6 each Topical Daily   cholecalciferol  1,000 Units Oral Daily   cycloSPORINE  1 drop Both Eyes BID   DULoxetine  120 mg Oral Daily   heparin  5,000 Units Subcutaneous Q8H   hydrALAZINE  100 mg Oral TID   insulin aspart  0-5 Units Subcutaneous QHS   insulin aspart  0-9 Units Subcutaneous TID WC   lipase/protease/amylase  36,000 Units Oral TID WC   methadone  5 mg Oral 5 X Daily   methylPREDNISolone (SOLU-MEDROL) injection  20 mg Intravenous TID   multivitamin with minerals  1 tablet Oral Daily   pantoprazole  40 mg Oral Daily   sodium chloride flush  10-40 mL Intracatheter Q12H   sucralfate  1 g Oral BID   Teduglutide (rDNA)  1.5 mg Subcutaneous Daily   vitamin B-12  1,000 mcg Oral Daily    Recommendations:  IV levaquin 500 mg daily for 7 more days through 10/04/21 OPAT and home health  Assessment: She has another bacteremia, likely line related with Acinetobacter and fluoroquinolone sensitive.  Will treat through this as she has no alternative sites for a new line and treat with above.     Diagnosis: Line infection   Culture Result: Acinetobacter  Allergies  Allergen Reactions   Meperidine Hives    Other reaction(s): GI Upset Due to Chrones    Hyoscyamine Hives and Swelling    Legs swelling Disorientation   Cefepime Other (See Comments)    Neurotoxicity occurring in setting of AKI. Ceftriaxone tolerated during same admit   Gabapentin Other (See Comments)    unknown   Lyrica [Pregabalin] Other (See Comments)    Has chronic dehydration and made it worse   Topamax [Topiramate] Other (See Comments)    Has chronic dehydration and made it worse   Zosyn [Piperacillin Sod-Tazobactam So]  Other (See Comments)    Patient reports it makes her vomit, her neck stiff, and her "heart feel funny" Affected kidneys   Fentanyl Rash    Pt is allergic to fentanyl patch related to the glue (gives her a rash) Pt states she is NOT allergic to fentanyl IV medicine   Morphine And Related Rash    OPAT Orders Discharge antibiotics to be given via PICC line Discharge antibiotics: levaquin 500 mg IV daily  Per pharmacy protocol yes  Duration: 7 days End Date: 10/04/21  [facility] Care Per Protocol: yes  Home health RN for IV administration and teaching; PICC line care and labs.    Labs weekly while on IV antibiotics: _x_ CBC with differential _x_ BMP __ CMP __ CRP __ ESR __ Vancomycin trough __ CK  _N/A_ Please pull PIC at completion of IV antibiotics __ Please leave PIC in place until doctor has seen patient or been notified  Fax weekly labs to [phone]  No follow up indicated at this time  Antibiotics: Day 4 total antibiotics   HPI: Caitlin Peters is a 60 y.o. 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  She  is well-known to the ID service with multiple previous line infections and unfortunately does not have any known sites for a new line.  Tmax 102.1 and normal WBC on admission.  She is feeling better since admission.  She is being evaluated at [facility] for possible small bowel transplant.    Review of Systems:  Constitutional: negative for fevers and chills Integument/breast: negative for rash All other systems reviewed and are negative    Past Medical History:  Diagnosis Date   Acute pancreatitis 04/13/2020   Anasarca 10/2019   AVN (avascular necrosis of bone) ([name])    Cataract    Choledocholithiasis (sludge) s/p ERCP 10/2019 10/21/2020   Chronic pain syndrome    CKD (chronic kidney disease), stage III ([place])    Crohn  disease ([place])    Crohn's disease of small & large intestine with [place]   Caitlin Peters is a 60 year old female with a history of Crohn's disease diagnosed in 57 (age 57), history of long-term steroid use with osteoporosis, S/P multiple bowel resections (6010-9323) complicated by chronic back and abdominal pain, steatorrhea and short bowel syndrome. The patient has been left with ~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 attached to proximal transverse colon through rectum. 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; 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; jejunal to transverse colon anastomosis Treated at [facility] lengthening to 165cm Dr Alene Mires, [facility]   Vitamin B12 deficiency     Social History   Tobacco Use   Smoking status: Former   Smokeless tobacco: Never  Scientific laboratory technician Use: Never used  Substance Use Topics   Alcohol use: Not Currently   Drug use: Never    Family History  Problem Relation Age of Onset   Seizures Mother    Glaucoma Mother    CAD Father    Heart disease Father    Hypertension Father    Breast cancer Sister    Multiple sclerosis Sister    Diabetes Sister    Lupus Sister    Colon cancer Other    Crohn's disease Other     Allergies  Allergen Reactions   Meperidine Hives    Other reaction(s): GI Upset Due to Chrones    Hyoscyamine Hives and Swelling    Legs swelling Disorientation   Cefepime Other (See Comments)    Neurotoxicity occurring in setting of AKI. Ceftriaxone tolerated during same admit   Gabapentin Other (See Comments)    unknown   Lyrica [Pregabalin] Other (See Comments)    Has chronic dehydration  and made it worse   Topamax [Topiramate] Other (See Comments)    Has chronic dehydration and made it worse   Zosyn [Piperacillin Sod-Tazobactam So] Other (See Comments)    Patient reports it makes her vomit, her neck stiff, and her "heart feel funny" Affected kidneys   Fentanyl Rash    Pt is allergic to fentanyl patch related to the glue (gives her a rash) Pt states she is NOT allergic to fentanyl IV medicine   Morphine And Related Rash  Physical Exam: Constitutional: in no apparent distress  Vitals:   09/28/21 1200 09/28/21 1242  BP:    Pulse: (!) 56   Resp: 15   Temp:  97.8 F (36.6 C)  SpO2: 91%    EYES: anicteric ENMT: no thrush Cardiovascular: Cor RRR Respiratory: clear; Musculoskeletal: no pedal edema noted Skin: negatives: no rash  Lab Results  Component Value Date   WBC 4.0 09/27/2021   HGB 7.5 (L) 09/27/2021   HCT 22.3 (L) 09/27/2021   MCV 93.3 09/27/2021   PLT 140 (L) 09/27/2021    Lab Results  Component Value Date   CREATININE 1.20 (H) 09/28/2021   BUN 28 (H) 09/28/2021   NA 139 09/28/2021   K 3.9 09/28/2021   CL 102 09/28/2021   CO2 31 09/28/2021    Lab Results  Component Value Date   ALT 18 09/28/2021   AST 15 09/28/2021   ALKPHOS 105 09/28/2021     Microbiology: Recent Results (from the past 240 hour(s))  Culture, blood (routine x 2)     Status: Abnormal   Collection Time: 09/25/21  5:56 PM   Specimen: BLOOD  Result Value Ref Range Status   Specimen Description   Final    BLOOD SITE NOT SPECIFIED Performed at [facility], [address] [place], [place] [address]    Special Requests   Final    BOTTLES DRAWN AEROBIC AND ANAEROBIC Blood Culture results may not be optimal due to an excessive volume of blood received in culture bottles Performed at [facility] [address], [place], [place] [address]    Culture  Setup Time   Final    GRAM NEGATIVE RODS IN BOTH AEROBIC AND ANAEROBIC BOTTLES CRITICAL  RESULT CALLED TO, READ BACK BY AND VERIFIED WITH: PHARMD J GADHIA [id number] AT 1131 BY CM Performed at [facility], [place] [address], [place], [place] [address]    Culture ACINETOBACTER URSINGII (A)  Final   Report Status 09/28/2021 FINAL  Final   Organism ID, Bacteria ACINETOBACTER URSINGII  Final      Susceptibility   Acinetobacter ursingii - MIC*    CEFTAZIDIME 16 INTERMEDIATE Intermediate     CIPROFLOXACIN <=0.25 SENSITIVE Sensitive     GENTAMICIN <=1 SENSITIVE Sensitive     IMIPENEM <=0.25 SENSITIVE Sensitive     TRIMETH/SULFA 40 SENSITIVE Sensitive     AMPICILLIN/SULBACTAM <=2 SENSITIVE Sensitive     * ACINETOBACTER URSINGII  Culture, blood (routine x 2)     Status: None (Preliminary result)   Collection Time: 09/25/21  6:05 PM   Specimen: BLOOD  Result Value Ref Range Status   Specimen Description   Final    BLOOD RIGHT WRIST Performed at [facility] [address], [place], [place] [address]    Special Requests   Final    BOTTLES DRAWN AEROBIC AND ANAEROBIC Blood Culture adequate volume Performed at [facility] [address] [place], [place] [address]    Culture  Setup Time   Final    GRAM NEGATIVE RODS AEROBIC BOTTLE ONLY CRITICAL VALUE NOTED.  VALUE IS CONSISTENT WITH PREVIOUSLY REPORTED AND CALLED VALUE.    Culture   Final    GRAM NEGATIVE RODS IDENTIFICATION TO FOLLOW Performed at [facility], [place] [address] [place], [place] [address]    Report Status PENDING  Incomplete  Resp Panel by RT-PCR (Flu A&B, Covid) Nasopharyngeal Swab     Status: None  Collection Time: 09/25/21  8:54 PM   Specimen: Nasopharyngeal Swab; Nasopharyngeal(NP) swabs in vial transport medium  Result Value Ref Range Status   SARS Coronavirus 2 by RT PCR NEGATIVE NEGATIVE Final    Comment: (NOTE) SARS-CoV-2 target nucleic acids are NOT DETECTED.  The SARS-CoV-2 RNA is generally detectable in upper respiratory specimens during the  acute phase of infection. The lowest concentration of SARS-CoV-2 viral copies this assay can detect is 138 copies/mL. A negative result does not preclude SARS-Cov-2 infection and should not be used as the sole basis for treatment or other patient management decisions. A negative result may occur with  improper specimen collection/handling, submission of specimen other than nasopharyngeal swab, presence of viral mutation(s) within the areas targeted by this assay, and inadequate number of viral copies(<138 copies/mL). A negative result must be combined with clinical observations, patient history, and epidemiological information. The expected result is Negative.  Fact Sheet for Patients:  EntrepreneurPulse.com.au  Fact Sheet for Healthcare Providers:  IncredibleEmployment.be  This test is no t yet approved or cleared by the [place] FDA and  has been authorized for detection and/or diagnosis of SARS-CoV-2 by FDA under an Emergency Use Authorization (EUA). This EUA will remain  in effect (meaning this test can be used) for the duration of the COVID-19 declaration under Section 564(b)(1) of the Act, 21 U.S.C.section 360bbb-3(b)(1), unless the authorization is terminated  or revoked sooner.       Influenza A by PCR NEGATIVE NEGATIVE Final   Influenza B by PCR NEGATIVE NEGATIVE Final    Comment: (NOTE) The Xpert Xpress SARS-CoV-2/FLU/RSV plus assay is intended as an aid in the diagnosis of influenza from Nasopharyngeal swab specimens and should not be used as a sole basis for treatment. Nasal washings and aspirates are unacceptable for Xpert Xpress SARS-CoV-2/FLU/RSV testing.  Fact Sheet for Patients: EntrepreneurPulse.com.au  Fact Sheet for Healthcare Providers: IncredibleEmployment.be  This test is not yet approved or cleared by the [place] FDA and has been authorized for detection and/or  diagnosis of SARS-CoV-2 by FDA under an Emergency Use Authorization (EUA). This EUA will remain in effect (meaning this test can be used) for the duration of the COVID-19 declaration under Section 564(b)(1) of the Act, 21 U.S.C. section 360bbb-3(b)(1), unless the authorization is terminated or revoked.  Performed at [facility], [place] [address], [place], [place] [address]   MRSA Next Gen by PCR, Nasal     Status: None   Collection Time: 09/25/21 11:12 PM   Specimen: Nasal Mucosa; Nasal Swab  Result Value Ref Range Status   MRSA by PCR Next Gen NOT DETECTED NOT DETECTED Final    Comment: (NOTE) The GeneXpert MRSA Assay (FDA approved for NASAL specimens only), is one component of a comprehensive MRSA colonization surveillance program. It is not intended to diagnose MRSA infection nor to guide or monitor treatment for MRSA infections. Test performance is not FDA approved in patients less than 67 years old. Performed at [facility], [place] [address] [place], [place] [address]   Urine Culture     Status: Abnormal   Collection Time: 09/26/21 11:46 AM   Specimen: Urine, Clean Catch  Result Value Ref Range Status   Specimen Description   Final    URINE, CLEAN CATCH Performed at [facility], [place] [address], [place], [place] [address]    Special Requests   Final    NONE Performed at [facility], [address] [place] [address] [place],  [place] [address]    Culture (A)  Final    <10,000 COLONIES/mL INSIGNIFICANT GROWTH Performed at [facility], [place] [address], [name], [place] [address]    Report Status 09/27/2021 FINAL  Final  Gastrointestinal Panel by PCR , Stool     Status: None   Collection Time: 09/26/21  4:15 PM   Specimen: Stool  Result Value Ref Range Status   Campylobacter species NOT DETECTED NOT DETECTED Final   Plesimonas shigelloides NOT DETECTED NOT DETECTED Final   Salmonella species NOT DETECTED  NOT DETECTED Final   Yersinia enterocolitica NOT DETECTED NOT DETECTED Final   Vibrio species NOT DETECTED NOT DETECTED Final   Vibrio cholerae NOT DETECTED NOT DETECTED Final   Enteroaggregative E coli (EAEC) NOT DETECTED NOT DETECTED Final   Enteropathogenic E coli (EPEC) NOT DETECTED NOT DETECTED Final   Enterotoxigenic E coli (ETEC) NOT DETECTED NOT DETECTED Final   Shiga like toxin producing E coli (STEC) NOT DETECTED NOT DETECTED Final   Shigella/Enteroinvasive E coli (EIEC) NOT DETECTED NOT DETECTED Final   Cryptosporidium NOT DETECTED NOT DETECTED Final   Cyclospora cayetanensis NOT DETECTED NOT DETECTED Final   Entamoeba histolytica NOT DETECTED NOT DETECTED Final   Giardia lamblia NOT DETECTED NOT DETECTED Final   Adenovirus F40/41 NOT DETECTED NOT DETECTED Final   Astrovirus NOT DETECTED NOT DETECTED Final   Norovirus GI/GII NOT DETECTED NOT DETECTED Final   Rotavirus A NOT DETECTED NOT DETECTED Final   Sapovirus (I, II, IV, and V) NOT DETECTED NOT DETECTED Final    Comment: Performed at [facility], [place] [place], [place] [address]  C Difficile Quick Screen w PCR reflex     Status: None   Collection Time: 09/26/21  4:15 PM   Specimen: Stool  Result Value Ref Range Status   C Diff antigen NEGATIVE NEGATIVE Final   C Diff toxin NEGATIVE NEGATIVE Final   C Diff interpretation No C. difficile detected.  Final    Comment: Performed at [facility], [place] [address] [place], [place] [address]    Millie Forde W Boots Mcglown, MD [facility] for Infectious Disease [facility] [email or web address] 09/28/2021, 1:05 PM

## 2021-09-28 NOTE — Progress Notes (Signed)
Physical Therapy Treatment Patient Details Name: Caitlin Peters MRN: [id number] DOB: Mar 24, 1961 Today's Date: 09/28/2021   History of Present Illness Caitlin Peters is a 60 y.o. female with medical history significant for Crohn's disease with multiple abdominal surgeries, short-bowel syndrome, postcholecystectomy, history of frequent hospitalization for line sepsis, chronic PICC line use due to TPN needs, essential hypertension, GERD, chronic anxiety/depression, seizure disorder, who presented to the [facility] ED 10/31/.22 due to worsening abdominal pain and fever x 1 day    PT Comments    Pt seen in ICU.  AxO x 3 very pleasant and knowledgeable about her medical condition.  Assisted with amb around full unit with Rollarot walker.  Pt stated she has one at home that she uses "sometimes" in community.  ABD pain 6/10 pre medicated.   Pt plans to return home and looks forward to her walks with her dog.     Recommendations for follow up therapy are one component of a multi-disciplinary discharge planning process, led by the attending physician.  Recommendations may be updated based on patient status, additional functional criteria and insurance authorization.  Follow Up Recommendations  No PT follow up     Assistance Recommended at Discharge None  Equipment Recommendations  None recommended by PT    Recommendations for Other Services       Precautions / Restrictions Precautions Precautions: Fall     Mobility  Bed Mobility Overal bed mobility: Needs Assistance Bed Mobility: Supine to Sit;Sit to Supine     Supine to sit: Supervision Sit to supine: Supervision   General bed mobility comments: increased time and supervision multiple lines    Transfers Overall transfer level: Needs assistance Equipment used: None Transfers: Sit to/from Stand;Stand Pivot Transfers Sit to Stand: Supervision Stand pivot transfers: Supervision         General transfer comment: pt self able to  rise with good safety cognition and use of hands to steady self    Ambulation/Gait Ambulation/Gait assistance: Supervision Gait Distance (Feet): 750 Feet Assistive device: Rollator (4 wheels) Gait Pattern/deviations: Step-through pattern Gait velocity: WNL   General Gait Details: pt tolerated amb full unit using Rollator.   Stairs             Wheelchair Mobility    Modified Rankin (Stroke Patients Only)       Balance                                            Cognition                                                Exercises      General Comments        Pertinent Vitals/Pain Pain Assessment: Faces Faces Pain Scale: Hurts little more Pain Location: ABD "all over" (Crohn's/Colotis) Pain Descriptors / Indicators: Discomfort;Cramping Pain Intervention(s): Monitored during session;Repositioned;Premedicated before session    Home Living                          Prior Function            PT Goals (current goals can now be found in the care plan section) Progress towards PT goals: Progressing toward goals  Frequency    Min 3X/week      PT Plan Current plan remains appropriate    Co-evaluation              AM-PAC PT "6 Clicks" Mobility   Outcome Measure  Help needed turning from your back to your side while in a flat bed without using bedrails?: A Little Help needed moving from lying on your back to sitting on the side of a flat bed without using bedrails?: A Little Help needed moving to and from a bed to a chair (including a wheelchair)?: A Little Help needed standing up from a chair using your arms (e.g., wheelchair or bedside chair)?: A Little Help needed to walk in hospital room?: A Little Help needed climbing 3-5 steps with a railing? : A Little 6 Click Score: 18    End of Session Equipment Utilized During Treatment: Gait belt Activity Tolerance: Patient tolerated treatment  well Patient left: in bed;with call bell/phone within reach Nurse Communication: Mobility status PT Visit Diagnosis: Unsteadiness on feet (R26.81);Difficulty in walking, not elsewhere classified (R26.2)     Time: 6773-7366 PT Time Calculation (min) (ACUTE ONLY): 12 min  Charges:  $Gait Training: 8-22 mins                     Rica Koyanagi  PTA Acute  Rehabilitation Services Pager      [phone] Office      [phone]

## 2021-09-28 NOTE — Progress Notes (Signed)
PHARMACY CONSULT NOTE FOR:  OUTPATIENT  PARENTERAL ANTIBIOTIC THERAPY (OPAT)  Indication: Acinetobacter bacteremia Regimen: Levofloxacin 500 mg IV daily End date: 10/04/2021  IV antibiotic discharge orders are pended. To discharging provider:  please sign these orders via discharge navigator,  Select New Orders & click on the button choice - Manage This Unsigned Work.     Thank you for allowing pharmacy to be a part of this patient's care.  Consuello Masse Floyed Masoud 09/28/2021, 1:43 PM

## 2021-09-28 NOTE — Progress Notes (Addendum)
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  No blood in the stools.  She was febrile in the ED at 102.1, and a CT scan of the abdomen and pelvis showed diffuse colitis and mildly dilated small bowel loops representing ileus versus enteritis.  There was also new pneumobilia.  Patient was placed on broad-spectrum antibiotics and was admitted to the hospital.  Assessment & Plan:   Sepsis due to diffuse colitis Concurrent Acinetobacter line infection/presumed bacteremia, POA Questionably concurrent Crohn's flare, short gut syndrome - Per CT scan on 09/25/2021 -Transition to Levaquin per ID, appreciate insight recommendations -Given poor vascular access and no other vascular sites we will continue to treat through PICC line without replacement for 7 days -We will likely need to hold methadone in the interim given borderline QTC prolongation   Chronic normocytic/iron deficiency anemia -Likely iron deficiency in the setting of poor p.o. intake and short gut syndrome  -Baseline in the 7-9 range over the past few months   Thrombocytopenia-chronic, due to chronic illness/malnutrition   Severe protein caloric malnutrition in the setting of chronic short gut syndrome  -Continue TPN, increase p.o. intake as appropriate and tolerated  Pneumobilia -Unclear etiology, continue to follow clinically   Chronic kidney disease, stage IIIb -Baseline creatinine around 1.5, currently at baseline.   Essential  hypertension -Continue to uptitrate home medications, patient's blood pressure is moderately poorly controlled due to short gut syndrome -Increase carvedilol to 12.5 mg, add as needed IV hydralazine -May need to add additional agent, consider clonidine given patient is already on amlodipine, hydralazine max dose and carvedilol.  Seizure disorder- -Continue home Keppra   QTC prolongation  -In the setting of methadone, will attempt to hold as above in the setting of fluoroquinolone use, patient states she has held methadone in the past without issue  Chronic pain syndrome - On chronic methadone, avoid concurrent IV narcotics as possible, pain moderately well controlled today  DVT prophylaxis: Heparin subcutaneous Code Status: Full Family Communication: None present  Status is: Inpatient  Dispo: The patient is from: Home              Anticipated d/c is to: Home              Anticipated d/c date is: 24 to 48 hours pending clinical course              Patient currently not medically stable for discharge  Consultants:  GI, ID  Procedures:  None  Antimicrobials:  Ceftriaxone, Flagyl 09/25/2021 - 09/28/21 Levaquin 09/28/21 - 10/04/21  Subjective: Continues to complain of poor p.o. intake, abdominal pain and painful diarrhea requesting IV narcotics while inpatient, we discussed transitioning to p.o. to better transition her home rather than abrupt discontinuation of IV narcotics at discharge.  Otherwise denies nausea vomiting headache fever chills chest pain shortness of breath  Objective: Vitals:   09/28/21 0400 09/28/21 0500 09/28/21 0600 09/28/21 0700  BP: (!) 173/56 (!) 160/60 (!) 174/62 (!) 179/78  Pulse: 73 72 (!) 125 66  Resp: 13 13 15  11  Temp: 98.2 F (36.8 C)     TempSrc: Oral     SpO2: 96% 100% 99% 98%  Weight:      Height:        Intake/Output Summary (Last 24 hours) at 09/28/2021 0731 Last data filed at 09/28/2021 0056 Gross per 24 hour  Intake 3489.89 ml  Output  1300 ml  Net 2189.89 ml    Filed Weights   09/25/21 1726  Weight: 62.1 kg    Examination:  General:  Pleasantly resting in bed, No acute distress. HEENT:  Normocephalic atraumatic.  Sclerae nonicteric, noninjected.  Extraocular movements intact bilaterally. Neck:  Without mass or deformity.  Trachea is midline. Lungs:  Clear to auscultate bilaterally without rhonchi, wheeze, or rales. Heart:  Regular rate and rhythm.  Without murmurs, rubs, or gallops. Abdomen:  Soft, nontender, nondistended.  Without guarding or rebound. Extremities: Without cyanosis, clubbing, edema, or obvious deformity.  Right femoral PICC line Vascular:  Dorsalis pedis and posterior tibial pulses palpable bilaterally. Skin:  Warm and dry, no erythema.  Data Reviewed: I have personally reviewed following labs and imaging studies  CBC: Recent Labs  Lab 09/25/21 1754 09/25/21 2357 09/26/21 0232 09/27/21 0500 09/27/21 0645  WBC 6.7 5.4 5.6 SPECIMEN CONTAMINATED, UNABLE TO PERFORM TEST(S). 4.0  NEUTROABS 5.6  --   --   --   --   HGB 7.4* 7.1* 7.3* SPECIMEN CONTAMINATED, UNABLE TO PERFORM TEST(S). 7.5*  HCT 22.0* 21.9* 22.0* SPECIMEN CONTAMINATED, UNABLE TO PERFORM TEST(S). 22.3*  MCV 92.4 94.8 94.0 SPECIMEN CONTAMINATED, UNABLE TO PERFORM TEST(S). 93.3  PLT 147* 140* 120* SPECIMEN CONTAMINATED, UNABLE TO PERFORM TEST(S). 140*    Basic Metabolic Panel: Recent Labs  Lab 09/25/21 1754 09/25/21 2357 09/26/21 0232 09/27/21 0645 09/28/21 0500  NA 138  --  138 137 139  K 3.2*  --  3.4* 3.5 3.9  CL 105  --  105 103 102  CO2 27  --  26 29 31   GLUCOSE 101*  --  84 129* 132*  BUN 38*  --  30* 22* 28*  CREATININE 1.64*  --  1.56* 1.50* 1.20*  CALCIUM 8.6*  --  8.1* 8.6* 8.9  MG  --  1.7 1.9 2.2 2.4  PHOS  --   --  2.7 2.9 3.6    GFR: Estimated Creatinine Clearance: 48.9 mL/min (A) (by C-G formula based on SCr of 1.2 mg/dL (H)). Liver Function Tests: Recent Labs  Lab 09/25/21 1754 09/26/21 0232  09/27/21 0645 09/28/21 0500  AST 17 18 16 15   ALT 18 19 18 18   ALKPHOS 117 118 116 105  BILITOT 0.7 0.7 0.3 0.3  PROT 6.0* 5.5* 5.7* 5.8*  ALBUMIN 2.5* 2.4* 2.3* 2.3*    Recent Labs  Lab 09/25/21 1754  LIPASE 24    No results for input(s): AMMONIA in the last 168 hours. Coagulation Profile: No results for input(s): INR, PROTIME in the last 168 hours. Cardiac Enzymes: No results for input(s): CKTOTAL, CKMB, CKMBINDEX, TROPONINI in the last 168 hours. BNP (last 3 results) No results for input(s): PROBNP in the last 8760 hours. HbA1C: Recent Labs    09/26/21 0232  HGBA1C 4.5*    CBG: Recent Labs  Lab 09/26/21 2123 09/27/21 0726 09/27/21 1214 09/27/21 1643 09/27/21 2204  GLUCAP 167* 128* 116* 150* 135*    Lipid Profile: Recent Labs    09/27/21 0500 09/27/21 0645  TRIG SPECIMEN CONTAMINATED, UNABLE TO PERFORM TEST(S). 45    Thyroid Function Tests:  No results for input(s): TSH, T4TOTAL, FREET4, T3FREE, THYROIDAB in the last 72 hours. Anemia Panel: No results for input(s): VITAMINB12, FOLATE, FERRITIN, TIBC, IRON, RETICCTPCT in the last 72 hours. Sepsis Labs: Recent Labs  Lab 09/25/21 1754  LATICACIDVEN 0.8     Recent Results (from the past 240 hour(s))  Culture, blood (routine x 2)     Status: None (Preliminary result)   Collection Time: 09/25/21  5:56 PM   Specimen: BLOOD  Result Value Ref Range Status   Specimen Description   Final    BLOOD SITE NOT SPECIFIED Performed at [facility] [address] [place], [place] [address]    Special Requests   Final    BOTTLES DRAWN AEROBIC AND ANAEROBIC Blood Culture results may not be optimal due to an excessive volume of blood received in culture bottles Performed at [facility] [address], [place], [place] [address]    Culture  Setup Time   Final    GRAM NEGATIVE RODS IN BOTH AEROBIC AND ANAEROBIC BOTTLES CRITICAL RESULT CALLED TO, READ BACK BY AND VERIFIED  WITH: PHARMD J GADHIA [id number] AT 1131 BY CM    Culture   Final    GRAM NEGATIVE RODS IDENTIFICATION AND SUSCEPTIBILITIES TO FOLLOW Performed at [facility], [place] [address] [place], [place] [address]    Report Status PENDING  Incomplete  Culture, blood (routine x 2)     Status: None (Preliminary result)   Collection Time: 09/25/21  6:05 PM   Specimen: BLOOD  Result Value Ref Range Status   Specimen Description   Final    BLOOD RIGHT WRIST Performed at [facility] [address], [place], [place] [address]    Special Requests   Final    BOTTLES DRAWN AEROBIC AND ANAEROBIC Blood Culture adequate volume Performed at [facility] [address], [place], [place] [address]    Culture   Final    NO GROWTH 3 DAYS Performed at [facility], [name] [address], [place], [place] [address]    Report Status PENDING  Incomplete  Resp Panel by RT-PCR (Flu A&B, Covid) Nasopharyngeal Swab     Status: None   Collection Time: 09/25/21  8:54 PM   Specimen: Nasopharyngeal Swab; Nasopharyngeal(NP) swabs in vial transport medium  Result Value Ref Range Status   SARS Coronavirus 2 by RT PCR NEGATIVE NEGATIVE Final    Comment: (NOTE) SARS-CoV-2 target nucleic acids are NOT DETECTED.  The SARS-CoV-2 RNA is generally detectable in upper respiratory specimens during the acute phase of infection. The lowest concentration of SARS-CoV-2 viral copies this assay can detect is 138 copies/mL. A negative result does not preclude SARS-Cov-2 infection and should not be used as the sole basis for treatment or other patient management decisions. A negative result may occur with  improper specimen collection/handling, submission of specimen other than nasopharyngeal swab, presence of viral mutation(s) within the areas targeted by this assay, and inadequate number of viral copies(<138 copies/mL). A negative result must be combined with clinical observations,  patient history, and epidemiological information. The expected result is Negative.  Fact Sheet for Patients:  EntrepreneurPulse.com.au  Fact Sheet for Healthcare Providers:  IncredibleEmployment.be  This test is no t yet approved or cleared by the [place] FDA and  has been authorized for detection and/or diagnosis of SARS-CoV-2 by FDA under an Emergency Use Authorization (EUA). This EUA will remain  in effect (meaning this test can be  used) for the duration of the COVID-19 declaration under Section 564(b)(1) of the Act, 21 U.S.C.section 360bbb-3(b)(1), unless the authorization is terminated  or revoked sooner.       Influenza A by PCR NEGATIVE NEGATIVE Final   Influenza B by PCR NEGATIVE NEGATIVE Final    Comment: (NOTE) The Xpert Xpress SARS-CoV-2/FLU/RSV plus assay is intended as an aid in the diagnosis of influenza from Nasopharyngeal swab specimens and should not be used as a sole basis for treatment. Nasal washings and aspirates are unacceptable for Xpert Xpress SARS-CoV-2/FLU/RSV testing.  Fact Sheet for Patients: EntrepreneurPulse.com.au  Fact Sheet for Healthcare Providers: IncredibleEmployment.be  This test is not yet approved or cleared by the [place] FDA and has been authorized for detection and/or diagnosis of SARS-CoV-2 by FDA under an Emergency Use Authorization (EUA). This EUA will remain in effect (meaning this test can be used) for the duration of the COVID-19 declaration under Section 564(b)(1) of the Act, 21 U.S.C. section 360bbb-3(b)(1), unless the authorization is terminated or revoked.  Performed at [facility], [place] [address] [place], [place] [address]   MRSA Next Gen by PCR, Nasal     Status: None   Collection Time: 09/25/21 11:12 PM   Specimen: Nasal Mucosa; Nasal Swab  Result Value Ref Range Status   MRSA by PCR Next Gen NOT DETECTED NOT  DETECTED Final    Comment: (NOTE) The GeneXpert MRSA Assay (FDA approved for NASAL specimens only), is one component of a comprehensive MRSA colonization surveillance program. It is not intended to diagnose MRSA infection nor to guide or monitor treatment for MRSA infections. Test performance is not FDA approved in patients less than 64 years old. Performed at [facility], [place] [address], [place], [place] [address]   Urine Culture     Status: Abnormal   Collection Time: 09/26/21 11:46 AM   Specimen: Urine, Clean Catch  Result Value Ref Range Status   Specimen Description   Final    URINE, CLEAN CATCH Performed at [facility], [place] [address], [place], [place] [address]    Special Requests   Final    NONE Performed at [facility], [place] [address], [place], [place] [address]    Culture (A)  Final    <10,000 COLONIES/mL INSIGNIFICANT GROWTH Performed at [facility] [address] [place], [place] [address]    Report Status 09/27/2021 FINAL  Final  Gastrointestinal Panel by PCR , Stool     Status: None   Collection Time: 09/26/21  4:15 PM   Specimen: Stool  Result Value Ref Range Status   Campylobacter species NOT DETECTED NOT DETECTED Final   Plesimonas shigelloides NOT DETECTED NOT DETECTED Final   Salmonella species NOT DETECTED NOT DETECTED Final   Yersinia enterocolitica NOT DETECTED NOT DETECTED Final   Vibrio species NOT DETECTED NOT DETECTED Final   Vibrio cholerae NOT DETECTED NOT DETECTED Final   Enteroaggregative E coli (EAEC) NOT DETECTED NOT DETECTED Final   Enteropathogenic E coli (EPEC) NOT DETECTED NOT DETECTED Final   Enterotoxigenic E coli (ETEC) NOT DETECTED NOT DETECTED Final   Shiga like toxin producing E coli (STEC) NOT DETECTED NOT DETECTED Final   Shigella/Enteroinvasive E coli (EIEC) NOT DETECTED NOT DETECTED Final   Cryptosporidium NOT DETECTED NOT DETECTED Final   Cyclospora  cayetanensis NOT DETECTED NOT DETECTED Final   Entamoeba histolytica NOT DETECTED NOT DETECTED Final   Giardia lamblia NOT DETECTED NOT DETECTED Final  Adenovirus F40/41 NOT DETECTED NOT DETECTED Final   Astrovirus NOT DETECTED NOT DETECTED Final   Norovirus GI/GII NOT DETECTED NOT DETECTED Final   Rotavirus A NOT DETECTED NOT DETECTED Final   Sapovirus (I, II, IV, and V) NOT DETECTED NOT DETECTED Final    Comment: Performed at [facility], [address], [place], [place] [address]  C Difficile Quick Screen w PCR reflex     Status: None   Collection Time: 09/26/21  4:15 PM   Specimen: Stool  Result Value Ref Range Status   C Diff antigen NEGATIVE NEGATIVE Final   C Diff toxin NEGATIVE NEGATIVE Final   C Diff interpretation No C. difficile detected.  Final    Comment: Performed at [facility], [place] [address], [place], [place] [address]    Radiology Studies: No results found.  Scheduled Meds:  amLODipine  10 mg Oral Daily   buPROPion ER  200 mg Oral Daily   carvedilol  6.25 mg Oral BID WC   Chlorhexidine Gluconate Cloth  6 each Topical Daily   cholecalciferol  1,000 Units Oral Daily   cycloSPORINE  1 drop Both Eyes BID   DULoxetine  120 mg Oral Daily   heparin  5,000 Units Subcutaneous Q8H   hydrALAZINE  100 mg Oral TID   insulin aspart  0-5 Units Subcutaneous QHS   insulin aspart  0-9 Units Subcutaneous TID WC   lipase/protease/amylase  36,000 Units Oral TID WC   methadone  5 mg Oral 5 X Daily   methylPREDNISolone (SOLU-MEDROL) injection  20 mg Intravenous TID   multivitamin with minerals  1 tablet Oral Daily   pantoprazole  40 mg Oral Daily   sucralfate  1 g Oral BID   Teduglutide (rDNA)  1.5 mg Subcutaneous Daily   vitamin B-12  1,000 mcg Oral Daily   Continuous Infusions:  ampicillin-sulbactam (UNASYN) IV 3 g (09/28/21 0602)   lactated ringers Stopped (09/27/21 1123)   levETIRAcetam Stopped (09/27/21 2154)   TPN ADULT (ION) 75 mL/hr at  09/28/21 0056     LOS: 3 days    Time spent: 66mn   Treyvone Chelf C Hamlin Devine, DO [facility]  If 7PM-7AM, please contact night-coverage [email or web address]  09/28/2021, 7:31 AM

## 2021-09-28 NOTE — Progress Notes (Addendum)
[facility] Progress Note  CC:  Crohn's colitis, fever  Subjective:  Frustrated about her pain control.  Had some painful diarrhea after eating last evening so really has not eaten anything today.  Objective:  Vital signs in last 24 hours: Temp:  [97.8 F (36.6 C)-98.2 F (36.8 C)] 98.1 F (36.7 C) (11/03 0800) Pulse Rate:  [36-125] 66 (11/03 0700) Resp:  [11-18] 11 (11/03 0700) BP: (160-189)/(56-101) 179/78 (11/03 0700) SpO2:  [91 %-100 %] 98 % (11/03 0700) Last BM Date: 09/26/21 General:  Alert, thin, in NAD Heart:  Regular rate and rhythm; no murmurs Pulm:  CTAB.  No W/R/R. Abdomen:  Soft, non-distended but somewhat firm.  BS present.  Mild diffuse TTP. Extremities:  Without edema. Neurologic:  Alert and oriented x 4;  grossly normal neurologically. Psych:  Alert and cooperative. Normal mood and affect.  Intake/Output from previous day: 11/02 0701 - 11/03 0700 In: 3489.9 [P.O.:240; I.V.:2459.6; IV Piggyback:790.3] Out: 1300 [Urine:1300] Intake/Output this shift: Total I/O In: 694.9 [I.V.:596.9; IV Piggyback:98] Out: -   Lab Results: Recent Labs    09/26/21 0232 09/27/21 0500 09/27/21 0645  WBC 5.6 SPECIMEN CONTAMINATED, UNABLE TO PERFORM TEST(S). 4.0  HGB 7.3* SPECIMEN CONTAMINATED, UNABLE TO PERFORM TEST(S). 7.5*  HCT 22.0* SPECIMEN CONTAMINATED, UNABLE TO PERFORM TEST(S). 22.3*  PLT 120* SPECIMEN CONTAMINATED, UNABLE TO PERFORM TEST(S). 140*   BMET Recent Labs    09/26/21 0232 09/27/21 0645 09/28/21 0500  NA 138 137 139  K 3.4* 3.5 3.9  CL 105 103 102  CO2 26 29 31   GLUCOSE 84 129* 132*  BUN 30* 22* 28*  CREATININE 1.56* 1.50* 1.20*  CALCIUM 8.1* 8.6* 8.9   LFT Recent Labs    09/28/21 0500  PROT 5.8*  ALBUMIN 2.3*  AST 15  ALT 18  ALKPHOS 105  BILITOT 0.3   Assessment / Plan: *Hx Crohn's, s/p multiple SB surgeries w resulting short gut/malabsorption.  On TNA since 2003.  Colonoscopy, biopsies 10/2020 w/o active disease.   Chronic diarrhea on Gatex since 11/2020.  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  Presented this time with worsening diarrhea and abdominal pain and fever, Tmax 102.4.  CT scan showing diffuse colitis, much worse compared to CT 8/31.  Stool negative for Cdiff and GI pathogen panel are negative.  Fecal calprotectin is pending.  CRP is elevated but is down slightly today at 6.1. She follows with [facility] intestinal transplant program, where she is established w Dr Domenica Fail.   *Bacteremia:  One of her blood cultures is growing [employer].Has history of line infections.  ? If this was drawn from her line site as it is not labeled/specified.  ID has seen her and the plan is to leave the line in place for now as she has not other alternative sites for access.  They recommended levaquin. *Chronic pain:  Follows with pain clinic.  On methadone at home. *Chronic normocytic anemia:  Hgb stable, per notes--heme negative per EDP with no overt sign of bleeding.  Takes Vitamin B12 supplements and IV iron. *Pneumobilia:  Seen on imaging.  This is chronic seen on several imaging studies, dating back to 03/2020 after her ERCP in 02/2020.  LFTs are normal.   -Started on Solumedrol 20 mg three times daily since Cdiff is negative, but ? If she should be on steroids in the setting of bacteremia. -Await fecal calprotectin. -Continue abx per ID. -Trend CRP. -Ok for soft diet (and lactose free  which patient already does at home).   LOS: 3 days   Laban Emperor. Zehr  09/28/2021, 9:50 AM     Attending Physician Note   I have taken an interval history, reviewed the chart and examined the patient. I personally saw the patient and performed a substantive portion of this encounter, including a complete performance of at least one of the key components, in conjunction with the APP. I agree with the APP's note, impression and recommendations.   Crohn's colitis, continue IV Solumedrol for now Short  gut syndrome with malabsorption on Gattex and TNA Chronic pain mgmt Acinetobacter sepsis presumed line sepsis, management per ID on levaquin IV  Lucio Edward, MD [facility] See AMION, [facility], for our on call provider

## 2021-09-28 NOTE — TOC Progression Note (Signed)
Transition of Care [facility]) - Progression Note   Patient Details  Name: AVONNA IRIBE MRN: [id number] Date of Birth: Mar 16, 1961  Transition of Care [facility]) CM/SW [place], LCSW Phone Number: 09/28/2021, 3:06 PM  Clinical Narrative: CSW notified by Jeannene Patella with [employer] that patient requires a custom TPN mix and will need to discharge on Friday (11/4) or Monday (11/7) as the TPN will not be set on the weekend. CSW updated hospitalist.  Expected Discharge Plan: [place] Barriers to Discharge: Continued Medical Work up  Expected Discharge Plan and Services Expected Discharge Plan: [place] In-house Referral: Clinical Social Work Post Acute Care Choice: [place] arrangements for the past 2 months: Apartment           DME Arranged: N/A DME Agency: NA HH Arranged: RN, TPN HH Agency: Tour manager, Other - See comment ([place]) Representative spoke with at [place]: Patient already active with [facility] for [place] for RN  Readmission Risk Interventions Readmission Risk Prevention Plan 09/26/2021 06/22/2021 02/10/2021  Transportation Screening Complete Complete Complete  PCP or Specialist Appt within 3-5 Days - - -  Not Complete comments - - -  HRI or [place] Work Consult for [place] Planning/Counseling - - -  SW consult not completed comments - - -  Palliative Care Screening - - -  Medication Review Press photographer) Complete Complete Complete  PCP or Specialist appointment within 3-5 days of discharge - Complete -  [place] or [place] Complete Complete Complete  SW Recovery Care/Counseling Consult Complete Complete Complete  Palliative Care Screening Not Applicable Not Applicable Not [place] Not Applicable Not Applicable Not Applicable

## 2021-09-28 NOTE — Progress Notes (Addendum)
Palliative care brief note  Discussed case with Dr. Avon Gully.  Goals are clear for continued aggressive care.  Palliative will sign off at this time.  Please call or reconsult if we can be of further assistance in the care of [name] moving forward.  Caitlin Rough, MD [facility] Pall[facility] Medicine Team [phone]  NO CHARGE NOTE

## 2021-09-29 LAB — BASIC METABOLIC PANEL
Anion gap: 6 (ref 5–15)
BUN: 34 mg/dL — ABNORMAL HIGH (ref 6–20)
CO2: 31 mmol/L (ref 22–32)
Calcium: 9 mg/dL (ref 8.9–10.3)
Chloride: 100 mmol/L (ref 98–111)
Creatinine, Ser: 1.56 mg/dL — ABNORMAL HIGH (ref 0.44–1.00)
GFR, Estimated: 38 mL/min — ABNORMAL LOW (ref >60)
Glucose, Bld: 149 mg/dL — ABNORMAL HIGH (ref 70–99)
Potassium: 4.1 mmol/L (ref 3.5–5.1)
Sodium: 137 mmol/L (ref 135–145)

## 2021-09-29 LAB — GLUCOSE, CAPILLARY
Glucose-Capillary: 117 mg/dL — ABNORMAL HIGH (ref 70–99)
Glucose-Capillary: 114 mg/dL — ABNORMAL HIGH (ref 70–99)

## 2021-09-29 LAB — C-REACTIVE PROTEIN: CRP: 1 mg/dL — ABNORMAL HIGH (ref <1.0)

## 2021-09-29 LAB — CULTURE, BLOOD (ROUTINE X 2): Special Requests: ADEQUATE

## 2021-09-29 LAB — PHOSPHORUS: Phosphorus: 3.7 mg/dL (ref 2.5–4.6)

## 2021-09-29 LAB — MAGNESIUM: Magnesium: 2.5 mg/dL — ABNORMAL HIGH (ref 1.7–2.4)

## 2021-09-29 MED ORDER — METHADONE HCL 5 MG PO TABS
5.0000 mg | ORAL_TABLET | Freq: Every day | ORAL | 0 refills | Status: AC
Start: 2021-09-29 — End: ?

## 2021-09-29 MED ORDER — PREDNISONE 10 MG PO TABS: ORAL_TABLET | ORAL | 0 refills | Status: DC

## 2021-09-29 MED ORDER — LEVOFLOXACIN IN D5W 250 MG/50ML IV SOLN
250.0000 mg | INTRAVENOUS | Status: DC
  Administered 2021-09-29: 250 mg via INTRAVENOUS
  Filled 2021-09-29: qty 50

## 2021-09-29 MED ORDER — CARVEDILOL 12.5 MG PO TABS: 12.5000 mg | ORAL_TABLET | Freq: Two times a day (BID) | ORAL | 0 refills | Status: DC

## 2021-09-29 MED ORDER — LEVOFLOXACIN IN D5W 250 MG/50ML IV SOLN: 250.0000 mg | INTRAVENOUS | 0 refills | Status: AC

## 2021-09-29 NOTE — TOC Transition Note (Signed)
Transition of Care [facility]) - CM/SW Discharge Note  Patient Details  Name: Caitlin Peters MRN: [id number] Date of Birth: 09/28/1961  Transition of Care [facility]) CM/SW Contact:  Sherie Don, LCSW Phone Number: 09/29/2021, 11:04 AM  Clinical Narrative: Patient will discharge home today with [facility] through [facility] and IV antibiotics and TPN through [facility]. CSW notified Pam with [facility] and Hoyle Sauer with [facility] of discharge. Pam to meet with patient to complete IV antibiotics training this afternoon. Orders have been placed and signed. Patient aware she will receive training prior to discharge. Patient reported her daughter will be able to pick her up between 2-3pm this afternoon. TOC signing off.  Final next level of care: [place] Barriers to Discharge: Barriers Resolved  Patient Goals and CMS Choice Patient states their goals for this hospitalization and ongoing recovery are:: Return home CMS Medicare.gov Compare Post Acute Care list provided to:: Patient Choice offered to / list presented to : Patient  Discharge Plan and Services In-house Referral: Clinical Social Work Post Acute Care Choice: Home Health          DME Arranged: N/A DME Agency: NA HH Arranged: RN, TPN HH Agency: Tour manager, Other - See comment ([place]) Representative spoke with at [place]: Patient already active with [employer] for [place] for RN  Readmission Risk Interventions Readmission Risk Prevention Plan 09/26/2021 06/22/2021 02/10/2021  Transportation Screening Complete Complete Complete  PCP or Specialist Appt within 3-5 Days - - -  Not Complete comments - - -  HRI or [place] Work Consult for [place] Planning/Counseling - - -  SW consult not completed comments - - -  Palliative Care Screening - - -  Medication Review Press photographer) Complete Complete Complete  PCP or Specialist appointment within 3-5 days of discharge - Complete -  [place] or [place] Complete Complete Complete  SW Recovery Care/Counseling Consult Complete Complete Complete  Palliative Care Screening Not Applicable Not Applicable Not [place] Not Applicable Not Applicable Not Applicable

## 2021-09-29 NOTE — Discharge Summary (Signed)
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  No blood in the stools.  She was febrile in the ED at 102.1, and a CT scan of the abdomen and pelvis showed diffuse colitis and mildly dilated small bowel loops representing ileus versus enteritis.  There was also new pneumobilia.  Patient was placed on broad-spectrum antibiotics and was admitted to the hospital.   Patient's colitis improved drastically, unfortunately has notable Acetobacter blood culture positive likely from chronic PICC line, discussed with ID and GI, will continue Levaquin at discharge via PICC line as patient is already on TPN through home health care.  Patient's colitis has essentially resolved at this point, we will continue steroid taper per GI recommendations but otherwise stable and agreeable for discharge home.  Currently recommending to hold methadone for the next 5 days while patient is on Levaquin due to QTC prolongation risks.  Patient understands and is agreeable to holding this medication through 10/05/2021.  Assessment & Plan:   Sepsis due to diffuse colitis Concurrent  Acinetobacter line infection/presumed bacteremia, POA Questionably concurrent Crohn's flare, short gut syndrome - Per CT scan on 09/25/2021 -Continue Levaquin per ID, appreciate insight recommendations -PICC line to remain in place, continue to treat through PICC line for 7 days total -Hold methadone given QTC prolongation risks   Chronic normocytic/iron deficiency anemia -Likely iron deficiency in the setting of poor p.o. intake and short gut syndrome  -Baseline in the 7-9 range over the past few months   Thrombocytopenia-chronic, due to chronic illness/malnutrition   Severe protein caloric malnutrition in the setting of chronic short gut syndrome  -Continue TPN, increase p.o. intake as appropriate and tolerated   Pneumobilia -Unclear etiology, appears chronic per old imaging, continue to follow -no acute indication for treatment or intervention   Chronic kidney disease, stage IIIb -At baseline.   Essential hypertension -Continue home medications, increased carvedilol dose as well -Patient indicates her blood pressure is poorly controlled while on steroids, follow with steroid taper, if she remains hypertensive would recommend clonidine patch given patient's short gut syndrome and likely poorly absorbing home medications which most are already at max dose.  Seizure disorder- -Continue home Keppra   QTC prolongation  -Continue to hold methadone as above while on Levaquin   Chronic pain syndrome -Continue home Dilaudid, hold methadone as above    Discharge Instructions  Discharge Instructions     [facility] pharmacist to adjust dose for Vancomycin, Aminoglycosides and other anti-infective therapies as requested by physician.   Complete by: As directed    [facility] to provide Cath Flo 37m   Complete by: As directed    Administer for PICC line occlusion and as ordered by physician for other access device issues.   Anaphylaxis  Kit: Provided to treat  any anaphylactic reaction to the medication being provided to the patient if First Dose or when requested by physician   Complete by: As directed    Epinephrine 92m/ml vial / amp: Administer 0.371m(0.30m40msubcutaneously once for moderate to severe anaphylaxis, nurse to call physician and pharmacy when reaction occurs and call 911 if needed for immediate care   Diphenhydramine 67m830m IV vial: Administer 25-67mg48mIM PRN for first dose reaction, rash, itching, mild reaction, nurse to call physician and pharmacy when reaction occurs   Sodium Chloride 0.9% NS 500ml 98mAdminister if needed for hypovolemic blood pressure drop or as ordered by physician after call to physician with anaphylactic reaction   Change dressing on IV access line weekly and PRN   Complete by: As directed    Flush IV access with Sodium Chloride 0.9% and Heparin 10 units/ml or 100 units/ml   Complete by: As directed    Home infusion instructions - [facility]   Complete by: As directed    Instructions: Flush IV access with Sodium Chloride 0.9% and Heparin 10units/ml or 100units/ml   Change dressing on IV access line: Weekly and PRN   Instructions Cath Flo 2mg: A5mnister for PICC Line occlusion and as ordered by physician for other access device   [facility] pharmacist to adjust dose for: Vancomycin, Aminoglycosides and other anti-infective therapies as requested by physician   Method of administration may be changed at the discretion of home infusion pharmacist based upon assessment of the patient and/or caregiver's ability to self-administer the medication ordered   Complete by: As directed    Outpatient Parenteral Antibiotic Therapy Information Antibiotic: Other; Comments: Levofloxacin; Indications for use: Acinetobacter bacteremia; End Date: 10/04/2021   Complete by: As directed    Antibiotic: Other   Comments: Levofloxacin   Indications for use: Acinetobacter bacteremia   End Date: 10/04/2021       Allergies as of 09/29/2021       Reactions   Meperidine Hives   Other reaction(s): GI Upset Due to Chrones   Hyoscyamine Hives, Swelling   Legs swelling Disorientation   Cefepime Other (See Comments)   Neurotoxicity occurring in setting of AKI. Ceftriaxone tolerated during same admit   Gabapentin Other (See Comments)   unknown   Lyrica [pregabalin] Other (See Comments)   Has chronic dehydration and made it worse   Topamax [topiramate] Other (See Comments)   Has chronic dehydration and made it worse   Zosyn [piperacillin Sod-tazobactam So] Other (See Comments)   Patient reports it makes her vomit, her neck stiff, and her "heart feel funny" Affected kidneys   Fentanyl Rash   Pt is allergic to fentanyl patch related to the glue (gives her a rash) Pt states she is NOT allergic to fentanyl IV medicine   Morphine And Related Rash        Medication List     TAKE these medications    amLODipine 10 MG tablet Commonly known as: NORVASC Take 1 tablet by mouth once daily   BIOFREEZE EX Apply 1 application topically as needed (pain).   buPROPion ER 100 MG 12 hr tablet Commonly known as: WELLBUTRIN SR Take 2 tablets by mouth once daily   Calcium 200 MG Tabs Take 200 mg by mouth daily.   carvedilol 12.5 MG tablet Commonly known as: COREG Take 1 tablet (12.5 mg total) by mouth 2 (two) times daily with a meal. What changed:  medication strength how much to take  cholecalciferol 25 MCG (1000 UNIT) tablet Commonly known as: VITAMIN D3 Take 1,000 Units by mouth daily.   cycloSPORINE 0.05 % ophthalmic emulsion Commonly known as: RESTASIS Place 1 drop into both eyes 2 (two) times daily.   Dexilant 60 MG capsule Generic drug: dexlansoprazole Take 1 capsule by mouth once daily What changed: how much to take   DULoxetine 60 MG capsule Commonly known as: CYMBALTA Take 2 capsules (120 mg total) by mouth daily.   estradiol 2 MG tablet Commonly known as: ESTRACE Take 1  tablet by mouth once daily   famotidine 20 MG tablet Commonly known as: PEPCID Take 20 mg by mouth daily as needed for heartburn or indigestion.   Gattex 5 MG Kit Generic drug: Teduglutide (rDNA) Inject 1.5 mg into the skin daily.   hydrALAZINE 100 MG tablet Commonly known as: APRESOLINE Take 1 tablet (100 mg total) by mouth 3 (three) times daily.   HYDROmorphone 4 MG tablet Commonly known as: DILAUDID Take 4 mg by mouth 4 (four) times daily as needed for moderate pain or severe pain.   levETIRAcetam 500 MG tablet Commonly known as: KEPPRA Take 1 tablet (500 mg total) by mouth 2 (two) times daily.   Levofloxacin 250 MG/50ML Soln Commonly known as: LEVAQUIN Inject 50 mLs (250 mg total) into the vein daily for 5 days.   lidocaine 5 % Commonly known as: LIDODERM Place 1 patch onto the skin daily. Remove & Discard patch within 12 hours or as directed by MD   lipase/protease/amylase 36000 UNITS Cpep capsule Commonly known as: Creon Take 1 capsule (36,000 Units total) by mouth See admin instructions. 36000 units with meals What changed:  when to take this additional instructions   methadone 5 MG tablet Commonly known as: DOLOPHINE Take 1 tablet (5 mg total) by mouth 5 (five) times daily.   methocarbamol 500 MG tablet Commonly known as: Robaxin Take 1 tablet (500 mg total) by mouth in the morning and at bedtime.   MULTIVITAMIN ADULT PO Take 1 tablet by mouth daily.   Narcan 4 MG/0.1ML Liqd nasal spray kit Generic drug: naloxone Place 1 spray into the nose once as needed (overdose).   polyvinyl alcohol 1.4 % ophthalmic solution Commonly known as: LIQUIFILM TEARS Place 1 drop into both eyes as needed for dry eyes.   predniSONE 10 MG tablet Commonly known as: DELTASONE Take 4 tablets (40 mg total) by mouth daily for 3 days, THEN 3 tablets (30 mg total) daily for 3 days, THEN 2 tablets (20 mg total) daily for 3 days, THEN 1 tablet (10 mg total) daily for 3 days. Start  taking on: September 29, 2021   predniSONE 10 MG tablet Commonly known as: DELTASONE Take 4 tablets (40 mg total) by mouth daily for 3 days, THEN 3 tablets (30 mg total) daily for 3 days, THEN 2 tablets (20 mg total) daily for 3 days, THEN 1 tablet (10 mg total) daily for 3 days. Start taking on: September 29, 2021   PRESCRIPTION MEDICATION Inject 1 each into the vein daily. Home TPN . [facility]  . 1 bag for 12 hours. [phone]   promethazine 25 MG tablet Commonly known as: PHENERGAN TAKE 1 TABLET BY MOUTH EVERY 6 HOURS AS NEEDED FOR NAUSEA FOR VOMITING What changed: See the new instructions.   sodium chloride 0.9 % infusion Inject 1,000 mLs into the vein daily. For chronic dehydration   sucralfate 1 GM/10ML suspension Commonly known as: Carafate Take 10  mLs (1 g total) by mouth 2 (two) times daily.   TRALEMENT IV Inject 1 mL into the vein See admin instructions. Used in TPN bag 4 times weekly   vitamin B-12 1000 MCG tablet Commonly known as: CYANOCOBALAMIN Take 1,000 mcg by mouth daily.               Discharge Care Instructions  (From admission, onward)           Start     Ordered   09/29/21 0000  Change dressing on IV access line weekly and PRN  (Home infusion instructions - [facility] )        09/29/21 0941            Follow-up Information     [facility] Follow up.   Why: IV antibiotics and TPN        [facility] Follow up.   Why: RN for IV antibiotics and TPN               Allergies  Allergen Reactions   Meperidine Hives    Other reaction(s): GI Upset Due to Chrones    Hyoscyamine Hives and Swelling    Legs swelling Disorientation   Cefepime Other (See Comments)    Neurotoxicity occurring in setting of AKI. Ceftriaxone tolerated during same admit   Gabapentin Other (See Comments)    unknown   Lyrica [Pregabalin] Other (See Comments)    Has chronic dehydration and made it worse    Topamax [Topiramate] Other (See Comments)    Has chronic dehydration and made it worse   Zosyn [Piperacillin Sod-Tazobactam So] Other (See Comments)    Patient reports it makes her vomit, her neck stiff, and her "heart feel funny" Affected kidneys   Fentanyl Rash    Pt is allergic to fentanyl patch related to the glue (gives her a rash) Pt states she is NOT allergic to fentanyl IV medicine   Morphine And Related Rash    Consultations: GI, infectious disease, palliative care  Procedures/Studies: CT Abdomen Pelvis W Contrast  Result Date: 09/25/2021 CLINICAL DATA:  Status post cholecystectomy. History of Crohn's disease. EXAM: CT ABDOMEN AND PELVIS WITH CONTRAST TECHNIQUE: Multidetector CT imaging of the abdomen and pelvis was performed using the standard protocol following bolus administration of intravenous contrast. CONTRAST:  71m OMNIPAQUE IOHEXOL 350 MG/ML SOLN COMPARISON:  CT abdomen and pelvis 07/26/2021. FINDINGS: Lower chest: No acute abnormality. Hepatobiliary: Gallbladder surgically absent. There is mild intra and extrahepatic biliary ductal prominence which is stable. There is new pneumobilia in the common bile duct and left lobe of the liver. No focal liver lesion identified. Pancreas: There is mild prominence of the pancreatic duct which is unchanged. No focal pancreatic lesions are seen. Spleen: Normal in size without focal abnormality. Adrenals/Urinary Tract: Adrenal glands are unremarkable. Kidneys are normal, without renal calculi, focal lesion, or hydronephrosis. Bladder is unremarkable. Stomach/Bowel: Again seen are numerous postsurgical changes in the bowel. Colon demonstrates diffuse wall thickening with mild surrounding inflammation diffusely to the level rectum. Stomach is decompressed. There are prominent small bowel loops with air-fluid levels measuring up to 3.5 cm. No definite small bowel wall thickening identified. No pneumatosis or free air. Vascular/Lymphatic: No  significant vascular findings are present. No enlarged abdominal or pelvic lymph nodes. Right femoral approach central venous catheter is again noted distal tip ending at the IVC/right atrial junction, unchanged. Reproductive: Status post hysterectomy. No adnexal masses. Other: No abdominal wall hernia or abnormality. No abdominopelvic  ascites. Musculoskeletal: No acute or significant osseous findings. IMPRESSION: 1. Diffuse colitis. 2. Mildly dilated small bowel loops may represent ileus or enteritis. Developing partial small bowel obstruction not excluded. 3. There is new pneumobilia. Please correlate with surgical history of sphincterotomy. Electronically Signed   By: Ronney Asters M.D.   On: 09/25/2021 19:36    Subjective: No acute issues or events overnight denies nausea vomiting diarrhea constipation headache fevers chills or chest pain  Discharge Exam: Vitals:   09/29/21 0800 09/29/21 1200  BP: (!) 182/66   Pulse: 70   Resp: 16   Temp: 98.3 F (36.8 C) 98 F (36.7 C)  SpO2: 100%    Vitals:   09/29/21 0600 09/29/21 0700 09/29/21 0800 09/29/21 1200  BP: (!) 186/68 (!) 186/78 (!) 182/66   Pulse: 73 68 70   Resp: 12 14 16    Temp:   98.3 F (36.8 C) 98 F (36.7 C)  TempSrc:   Oral Oral  SpO2:  100% 100%   Weight:      Height:       General: Pt is alert, awake, not in acute distress Cardiovascular: RRR, S1/S2 +, no rubs, no gallops Respiratory: CTA bilaterally, no wheezing, no rhonchi Abdominal: Soft, NT, ND, bowel sounds + Extremities: no edema, no cyanosis  The results of significant diagnostics from this hospitalization (including imaging, microbiology, ancillary and laboratory) are listed below for reference.     Microbiology: Recent Results (from the past 240 hour(s))  Culture, blood (routine x 2)     Status: Abnormal   Collection Time: 09/25/21  5:56 PM   Specimen: BLOOD  Result Value Ref Range Status   Specimen Description   Final    BLOOD SITE NOT  SPECIFIED Performed at [facility], [address] [place], [place] [address]    Special Requests   Final    BOTTLES DRAWN AEROBIC AND ANAEROBIC Blood Culture results may not be optimal due to an excessive volume of blood received in culture bottles Performed at [place] [address] [place], [place] [address]    Culture  Setup Time   Final    GRAM NEGATIVE RODS IN BOTH AEROBIC AND ANAEROBIC BOTTLES CRITICAL RESULT CALLED TO, READ BACK BY AND VERIFIED WITH: PHARMD J GADHIA [id number] AT 1131 BY CM Performed at [facility], [place] [address] [place], [place] [address]    Culture ACINETOBACTER URSINGII (A)  Final   Report Status 09/28/2021 FINAL  Final   Organism ID, Bacteria ACINETOBACTER URSINGII  Final      Susceptibility   Acinetobacter ursingii - MIC*    CEFTAZIDIME 16 INTERMEDIATE Intermediate     CIPROFLOXACIN <=0.25 SENSITIVE Sensitive     GENTAMICIN <=1 SENSITIVE Sensitive     IMIPENEM <=0.25 SENSITIVE Sensitive     TRIMETH/SULFA 40 SENSITIVE Sensitive     AMPICILLIN/SULBACTAM <=2 SENSITIVE Sensitive     * ACINETOBACTER URSINGII  Culture, blood (routine x 2)     Status: Abnormal   Collection Time: 09/25/21  6:05 PM   Specimen: BLOOD  Result Value Ref Range Status   Specimen Description   Final    BLOOD RIGHT WRIST Performed at [place] [address], [place], [place] [address]    Special Requests   Final    BOTTLES DRAWN AEROBIC AND ANAEROBIC Blood Culture adequate volume Performed at [place] [address], [place],  [address]    Culture  Setup Time  Final    GRAM NEGATIVE RODS AEROBIC BOTTLE ONLY CRITICAL VALUE NOTED.  VALUE IS CONSISTENT WITH PREVIOUSLY REPORTED AND CALLED VALUE.    Culture (A)  Final    ACINETOBACTER URSINGII SUSCEPTIBILITIES PERFORMED ON PREVIOUS CULTURE WITHIN THE LAST 5 DAYS. Performed at [facility], [place] [address] [place], [place]  [address]    Report Status 09/29/2021 FINAL  Final  Resp Panel by RT-PCR (Flu A&B, Covid) Nasopharyngeal Swab     Status: None   Collection Time: 09/25/21  8:54 PM   Specimen: Nasopharyngeal Swab; Nasopharyngeal(NP) swabs in vial transport medium  Result Value Ref Range Status   SARS Coronavirus 2 by RT PCR NEGATIVE NEGATIVE Final    Comment: (NOTE) SARS-CoV-2 target nucleic acids are NOT DETECTED.  The SARS-CoV-2 RNA is generally detectable in upper respiratory specimens during the acute phase of infection. The lowest concentration of SARS-CoV-2 viral copies this assay can detect is 138 copies/mL. A negative result does not preclude SARS-Cov-2 infection and should not be used as the sole basis for treatment or other patient management decisions. A negative result may occur with  improper specimen collection/handling, submission of specimen other than nasopharyngeal swab, presence of viral mutation(s) within the areas targeted by this assay, and inadequate number of viral copies(<138 copies/mL). A negative result must be combined with clinical observations, patient history, and epidemiological information. The expected result is Negative.  Fact Sheet for Patients:  EntrepreneurPulse.com.au  Fact Sheet for Healthcare Providers:  IncredibleEmployment.be  This test is no t yet approved or cleared by the [place] FDA and  has been authorized for detection and/or diagnosis of SARS-CoV-2 by FDA under an Emergency Use Authorization (EUA). This EUA will remain  in effect (meaning this test can be used) for the duration of the COVID-19 declaration under Section 564(b)(1) of the Act, 21 U.S.C.section 360bbb-3(b)(1), unless the authorization is terminated  or revoked sooner.       Influenza A by PCR NEGATIVE NEGATIVE Final   Influenza B by PCR NEGATIVE NEGATIVE Final    Comment: (NOTE) The Xpert Xpress SARS-CoV-2/FLU/RSV plus assay is intended as an  aid in the diagnosis of influenza from Nasopharyngeal swab specimens and should not be used as a sole basis for treatment. Nasal washings and aspirates are unacceptable for Xpert Xpress SARS-CoV-2/FLU/RSV testing.  Fact Sheet for Patients: EntrepreneurPulse.com.au  Fact Sheet for Healthcare Providers: IncredibleEmployment.be  This test is not yet approved or cleared by the [place] FDA and has been authorized for detection and/or diagnosis of SARS-CoV-2 by FDA under an Emergency Use Authorization (EUA). This EUA will remain in effect (meaning this test can be used) for the duration of the COVID-19 declaration under Section 564(b)(1) of the Act, 21 U.S.C. section 360bbb-3(b)(1), unless the authorization is terminated or revoked.  Performed at [facility], [place] [address], [place],  [address]   MRSA Next Gen by PCR, Nasal     Status: None   Collection Time: 09/25/21 11:12 PM   Specimen: Nasal Mucosa; Nasal Swab  Result Value Ref Range Status   MRSA by PCR Next Gen NOT DETECTED NOT DETECTED Final    Comment: (NOTE) The GeneXpert MRSA Assay (FDA approved for NASAL specimens only), is one component of a comprehensive MRSA colonization surveillance program. It is not intended to diagnose MRSA infection nor to guide or monitor treatment for MRSA infections. Test performance is not FDA approved in patients less than 58 years old. Performed at [facility],  [place] [address] [place], [place] [address]   Urine Culture     Status: Abnormal   Collection Time: 09/26/21 11:46 AM   Specimen: Urine, Clean Catch  Result Value Ref Range Status   Specimen Description   Final    URINE, CLEAN CATCH Performed at [facility], [place] [address] [place], [place] [address]    Special Requests   Final    NONE Performed at [facility], [place] [address], [place], [place]  [address]    Culture (A)  Final    <10,000 COLONIES/mL INSIGNIFICANT GROWTH Performed at [place] [address], [place], [place] [address]    Report Status 09/27/2021 FINAL  Final  Gastrointestinal Panel by PCR , Stool     Status: None   Collection Time: 09/26/21  4:15 PM   Specimen: Stool  Result Value Ref Range Status   Campylobacter species NOT DETECTED NOT DETECTED Final   Plesimonas shigelloides NOT DETECTED NOT DETECTED Final   Salmonella species NOT DETECTED NOT DETECTED Final   Yersinia enterocolitica NOT DETECTED NOT DETECTED Final   Vibrio species NOT DETECTED NOT DETECTED Final   Vibrio cholerae NOT DETECTED NOT DETECTED Final   Enteroaggregative E coli (EAEC) NOT DETECTED NOT DETECTED Final   Enteropathogenic E coli (EPEC) NOT DETECTED NOT DETECTED Final   Enterotoxigenic E coli (ETEC) NOT DETECTED NOT DETECTED Final   Shiga like toxin producing E coli (STEC) NOT DETECTED NOT DETECTED Final   Shigella/Enteroinvasive E coli (EIEC) NOT DETECTED NOT DETECTED Final   Cryptosporidium NOT DETECTED NOT DETECTED Final   Cyclospora cayetanensis NOT DETECTED NOT DETECTED Final   Entamoeba histolytica NOT DETECTED NOT DETECTED Final   Giardia lamblia NOT DETECTED NOT DETECTED Final   Adenovirus F40/41 NOT DETECTED NOT DETECTED Final   Astrovirus NOT DETECTED NOT DETECTED Final   Norovirus GI/GII NOT DETECTED NOT DETECTED Final   Rotavirus A NOT DETECTED NOT DETECTED Final   Sapovirus (I, II, IV, and V) NOT DETECTED NOT DETECTED Final    Comment: Performed at [facility], [address] [place], [place] [address]  C Difficile Quick Screen w PCR reflex     Status: None   Collection Time: 09/26/21  4:15 PM   Specimen: Stool  Result Value Ref Range Status   C Diff antigen NEGATIVE NEGATIVE Final   C Diff toxin NEGATIVE NEGATIVE Final   C Diff interpretation No C. difficile detected.  Final    Comment: Performed at [facility], [place]  [address] [place], [place] [address]  Calprotectin, Fecal     Status: None   Collection Time: 09/26/21  4:15 PM   Specimen: Stool  Result Value Ref Range Status   Calprotectin, Fecal 118 0 - 120 ug/g Final    Comment: (NOTE) Concentration     Interpretation   Follow-Up <16 - 50 ug/g     Normal           None >50 -120 ug/g     Borderline       Re-evaluate in 4-6 weeks    >120 ug/g     Abnormal         Repeat as clinically                                   indicated Performed At: [facility] [address] [place], [place] [id number] Perlie Gold  Derinda Late MD OY:[id number]      Labs: BNP (last 3 results) Recent Labs    05/06/21 2010  BNP 67.2   Basic Metabolic Panel: Recent Labs  Lab 09/25/21 1754 09/25/21 2357 09/26/21 0232 09/27/21 0645 09/28/21 0500 09/29/21 0440  NA 138  --  138 137 139 137  K 3.2*  --  3.4* 3.5 3.9 4.1  CL 105  --  105 103 102 100  CO2 27  --  26 29 31 31   GLUCOSE 101*  --  84 129* 132* 149*  BUN 38*  --  30* 22* 28* 34*  CREATININE 1.64*  --  1.56* 1.50* 1.20* 1.56*  CALCIUM 8.6*  --  8.1* 8.6* 8.9 9.0  MG  --  1.7 1.9 2.2 2.4 2.5*  PHOS  --   --  2.7 2.9 3.6 3.7   Liver Function Tests: Recent Labs  Lab 09/25/21 1754 09/26/21 0232 09/27/21 0645 09/28/21 0500  AST 17 18 16 15   ALT 18 19 18 18   ALKPHOS 117 118 116 105  BILITOT 0.7 0.7 0.3 0.3  PROT 6.0* 5.5* 5.7* 5.8*  ALBUMIN 2.5* 2.4* 2.3* 2.3*   Recent Labs  Lab 09/25/21 1754  LIPASE 24   No results for input(s): AMMONIA in the last 168 hours. CBC: Recent Labs  Lab 09/25/21 1754 09/25/21 2357 09/26/21 0232 09/27/21 0500 09/27/21 0645  WBC 6.7 5.4 5.6 SPECIMEN CONTAMINATED, UNABLE TO PERFORM TEST(S). 4.0  NEUTROABS 5.6  --   --   --   --   HGB 7.4* 7.1* 7.3* SPECIMEN CONTAMINATED, UNABLE TO PERFORM TEST(S). 7.5*  HCT 22.0* 21.9* 22.0* SPECIMEN CONTAMINATED, UNABLE TO PERFORM TEST(S). 22.3*  MCV 92.4 94.8 94.0 SPECIMEN CONTAMINATED, UNABLE TO PERFORM TEST(S). 93.3  PLT  147* 140* 120* SPECIMEN CONTAMINATED, UNABLE TO PERFORM TEST(S). 140*   Cardiac Enzymes: No results for input(s): CKTOTAL, CKMB, CKMBINDEX, TROPONINI in the last 168 hours. BNP: Invalid input(s): POCBNP CBG: Recent Labs  Lab 09/28/21 1221 09/28/21 1820 09/28/21 2143 09/29/21 0734 09/29/21 1114  GLUCAP 108* 141* 147* 117* 114*   D-Dimer No results for input(s): DDIMER in the last 72 hours. Hgb A1c No results for input(s): HGBA1C in the last 72 hours. Lipid Profile Recent Labs    09/27/21 0500 09/27/21 0645  TRIG SPECIMEN CONTAMINATED, UNABLE TO PERFORM TEST(S). 45   Thyroid function studies No results for input(s): TSH, T4TOTAL, T3FREE, THYROIDAB in the last 72 hours.  Invalid input(s): FREET3 Anemia work up No results for input(s): VITAMINB12, FOLATE, FERRITIN, TIBC, IRON, RETICCTPCT in the last 72 hours. Urinalysis    Component Value Date/Time   COLORURINE STRAW (A) 09/27/2021 1030   APPEARANCEUR CLEAR 09/27/2021 1030   LABSPEC 1.006 09/27/2021 1030   PHURINE 8.0 09/27/2021 1030   GLUCOSEU NEGATIVE 09/27/2021 1030   HGBUR NEGATIVE 09/27/2021 1030   BILIRUBINUR NEGATIVE 09/27/2021 1030   KETONESUR NEGATIVE 09/27/2021 1030   PROTEINUR 30 (A) 09/27/2021 1030   NITRITE NEGATIVE 09/27/2021 1030   LEUKOCYTESUR NEGATIVE 09/27/2021 1030   Sepsis Labs Invalid input(s): PROCALCITONIN,  WBC,  LACTICIDVEN Microbiology Recent Results (from the past 240 hour(s))  Culture, blood (routine x 2)     Status: Abnormal   Collection Time: 09/25/21  5:56 PM   Specimen: BLOOD  Result Value Ref Range Status   Specimen Description   Final    BLOOD SITE NOT SPECIFIED Performed at [facility], [address] [place], [place] [address]    Special Requests   Final  BOTTLES DRAWN AEROBIC AND ANAEROBIC Blood Culture results may not be optimal due to an excessive volume of blood received in culture bottles Performed at [facility], [place], [place] [address],  [place], [place] [address]    Culture  Setup Time   Final    GRAM NEGATIVE RODS IN BOTH AEROBIC AND ANAEROBIC BOTTLES CRITICAL RESULT CALLED TO, READ BACK BY AND VERIFIED WITH: PHARMD J GADHIA [id number] AT 1131 BY CM Performed at [facility], [place] [address] [place], [place] [address]    Culture ACINETOBACTER URSINGII (A)  Final   Report Status 09/28/2021 FINAL  Final   Organism ID, Bacteria ACINETOBACTER URSINGII  Final      Susceptibility   Acinetobacter ursingii - MIC*    CEFTAZIDIME 16 INTERMEDIATE Intermediate     CIPROFLOXACIN <=0.25 SENSITIVE Sensitive     GENTAMICIN <=1 SENSITIVE Sensitive     IMIPENEM <=0.25 SENSITIVE Sensitive     TRIMETH/SULFA 40 SENSITIVE Sensitive     AMPICILLIN/SULBACTAM <=2 SENSITIVE Sensitive     * ACINETOBACTER URSINGII  Culture, blood (routine x 2)     Status: Abnormal   Collection Time: 09/25/21  6:05 PM   Specimen: BLOOD  Result Value Ref Range Status   Specimen Description   Final    BLOOD RIGHT WRIST Performed at [place] [address], [place], [place] [address]    Special Requests   Final    BOTTLES DRAWN AEROBIC AND ANAEROBIC Blood Culture adequate volume Performed at [place] [address], [place], [place] [address]    Culture  Setup Time   Final    GRAM NEGATIVE RODS AEROBIC BOTTLE ONLY CRITICAL VALUE NOTED.  VALUE IS CONSISTENT WITH PREVIOUSLY REPORTED AND CALLED VALUE.    Culture (A)  Final    ACINETOBACTER URSINGII SUSCEPTIBILITIES PERFORMED ON PREVIOUS CULTURE WITHIN THE LAST 5 DAYS. Performed at  Hospital Lab, [place] [address], [place], [place] [address]    Report Status 09/29/2021 FINAL  Final  Resp Panel by RT-PCR (Flu A&B, Covid) Nasopharyngeal Swab     Status: None   Collection Time: 09/25/21  8:54 PM   Specimen: Nasopharyngeal Swab; Nasopharyngeal(NP) swabs in vial transport medium  Result Value Ref Range Status   SARS Coronavirus 2 by RT PCR NEGATIVE NEGATIVE  Final    Comment: (NOTE) SARS-CoV-2 target nucleic acids are NOT DETECTED.  The SARS-CoV-2 RNA is generally detectable in upper respiratory specimens during the acute phase of infection. The lowest concentration of SARS-CoV-2 viral copies this assay can detect is 138 copies/mL. A negative result does not preclude SARS-Cov-2 infection and should not be used as the sole basis for treatment or other patient management decisions. A negative result may occur with  improper specimen collection/handling, submission of specimen other than nasopharyngeal swab, presence of viral mutation(s) within the areas targeted by this assay, and inadequate number of viral copies(<138 copies/mL). A negative result must be combined with clinical observations, patient history, and epidemiological information. The expected result is Negative.  Fact Sheet for Patients:  EntrepreneurPulse.com.au  Fact Sheet for Healthcare Providers:  IncredibleEmployment.be  This test is no t yet approved or cleared by the [place] FDA and  has been authorized for detection and/or diagnosis of SARS-CoV-2 by FDA under an Emergency Use Authorization (EUA). This EUA will remain  in effect (meaning this test can be used) for the duration of the COVID-19 declaration under Section 564(b)(1) of the Act, 21 U.S.C.section 360bbb-3(b)(1), unless  the authorization is terminated  or revoked sooner.       Influenza A by PCR NEGATIVE NEGATIVE Final   Influenza B by PCR NEGATIVE NEGATIVE Final    Comment: (NOTE) The Xpert Xpress SARS-CoV-2/FLU/RSV plus assay is intended as an aid in the diagnosis of influenza from Nasopharyngeal swab specimens and should not be used as a sole basis for treatment. Nasal washings and aspirates are unacceptable for Xpert Xpress SARS-CoV-2/FLU/RSV testing.  Fact Sheet for Patients: EntrepreneurPulse.com.au  Fact Sheet for Healthcare  Providers: IncredibleEmployment.be  This test is not yet approved or cleared by the [place] FDA and has been authorized for detection and/or diagnosis of SARS-CoV-2 by FDA under an Emergency Use Authorization (EUA). This EUA will remain in effect (meaning this test can be used) for the duration of the COVID-19 declaration under Section 564(b)(1) of the Act, 21 U.S.C. section 360bbb-3(b)(1), unless the authorization is terminated or revoked.  Performed at [facility], [place] [address], [place], [place] [address]   MRSA Next Gen by PCR, Nasal     Status: None   Collection Time: 09/25/21 11:12 PM   Specimen: Nasal Mucosa; Nasal Swab  Result Value Ref Range Status   MRSA by PCR Next Gen NOT DETECTED NOT DETECTED Final    Comment: (NOTE) The GeneXpert MRSA Assay (FDA approved for NASAL specimens only), is one component of a comprehensive MRSA colonization surveillance program. It is not intended to diagnose MRSA infection nor to guide or monitor treatment for MRSA infections. Test performance is not FDA approved in patients less than 70 years old. Performed at [facility], [place] [address], [place], [place] [address]   Urine Culture     Status: Abnormal   Collection Time: 09/26/21 11:46 AM   Specimen: Urine, Clean Catch  Result Value Ref Range Status   Specimen Description   Final    URINE, CLEAN CATCH Performed at [facility], [place] [address], [place], [place] [address]    Special Requests   Final    NONE Performed at [facility], [place] [address] [place], [place] [address]    Culture (A)  Final    <10,000 COLONIES/mL INSIGNIFICANT GROWTH Performed at [place] [address] [place], [place] [address]    Report Status 09/27/2021 FINAL  Final  Gastrointestinal Panel by PCR , Stool     Status: None   Collection Time: 09/26/21  4:15 PM   Specimen: Stool  Result Value  Ref Range Status   Campylobacter species NOT DETECTED NOT DETECTED Final   Plesimonas shigelloides NOT DETECTED NOT DETECTED Final   Salmonella species NOT DETECTED NOT DETECTED Final   Yersinia enterocolitica NOT DETECTED NOT DETECTED Final   Vibrio species NOT DETECTED NOT DETECTED Final   Vibrio cholerae NOT DETECTED NOT DETECTED Final   Enteroaggregative E coli (EAEC) NOT DETECTED NOT DETECTED Final   Enteropathogenic E coli (EPEC) NOT DETECTED NOT DETECTED Final   Enterotoxigenic E coli (ETEC) NOT DETECTED NOT DETECTED Final   Shiga like toxin producing E coli (STEC) NOT DETECTED NOT DETECTED Final   Shigella/Enteroinvasive E coli (EIEC) NOT DETECTED NOT DETECTED Final   Cryptosporidium NOT DETECTED NOT DETECTED Final   Cyclospora cayetanensis NOT DETECTED NOT DETECTED Final   Entamoeba histolytica NOT DETECTED NOT DETECTED Final   Giardia lamblia NOT DETECTED NOT DETECTED Final   Adenovirus F40/41 NOT DETECTED NOT DETECTED Final   Astrovirus NOT DETECTED NOT DETECTED Final  Norovirus GI/GII NOT DETECTED NOT DETECTED Final   Rotavirus A NOT DETECTED NOT DETECTED Final   Sapovirus (I, II, IV, and V) NOT DETECTED NOT DETECTED Final    Comment: Performed at [facility], [place], [place] [address]  C Difficile Quick Screen w PCR reflex     Status: None   Collection Time: 09/26/21  4:15 PM   Specimen: Stool  Result Value Ref Range Status   C Diff antigen NEGATIVE NEGATIVE Final   C Diff toxin NEGATIVE NEGATIVE Final   C Diff interpretation No C. difficile detected.  Final    Comment: Performed at [facility], [place] [address] [place], [place] [address]  Calprotectin, Fecal     Status: None   Collection Time: 09/26/21  4:15 PM   Specimen: Stool  Result Value Ref Range Status   Calprotectin, Fecal 118 0 - 120 ug/g Final    Comment: (NOTE) Concentration     Interpretation   Follow-Up <16 - 50 ug/g     Normal           None >50 -120  ug/g     Borderline       Re-evaluate in 4-6 weeks    >120 ug/g     Abnormal         Repeat as clinically                                   indicated Performed At: [facility] [place], [place] [id number] Rush Farmer MD PJ:[id number]      Time coordinating discharge: Over 30 minutes  SIGNED:   Little Ishikawa, DO [facility] 09/29/2021, 1:24 PM Pager   If 7PM-7AM, please contact night-coverage [email or web address]

## 2021-09-29 NOTE — Progress Notes (Signed)
RN discussed discharge instructions with patient. Femoral access flushed and patient preferred to use home hep lock.

## 2021-09-29 NOTE — Progress Notes (Signed)
PHARMACY - TOTAL PARENTERAL NUTRITION CONSULT NOTE   Indication: Short bowel syndrome  Patient Measurements: Height: 5' 8"  (172.7 cm) Weight: 62.1 kg (137 lb) IBW/kg (Calculated) : 63.9 TPN AdjBW (KG): 62.1 Body mass index is 20.83 kg/m. 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 Tx colitis  Glucose / Insulin: No hx DM, A1c 4.5 -BG goal: <180 -BG range: 117 - 149; on IV steroids. sSSI TID AC - none used/24 hr Electrolytes: Mg, CorrCa (10.4) slightly elevated; all other lytes WNL  Renal: Hx CKDIII, SCr 1.5 at baseline of 1.5-1.6, BUN increased Hepatic: LFTs WNL, Alb low 2.3 but near baseline Trig: WNL Intake / Output; MIVF: LR at 76m/hr, (stopped D10W infusion) GI Imaging: - 10/31 CT abd/pelvis: diffuse colitis, mildly dilated small bowel loops may represent ileus or enteritis. Developing partial small bowel obstruction not excluded. There is new pneumobilia. GI Surgeries / Procedures:   Central access: PICC already in place TPN start date: continued from PTA  Home TPN regimen from [place] [facility] 12hrs daily  - Provides 85 grams per day, 1088 kcal per day 4x/week - 20% SMOF lipids added 3x/week provides 85 grams protein per day, 1688.    Nutritional Goals: Goal TPN rate is 75 mL/hr (provides 90 g of protein and 1818 kcals per day)  RD Assessment: Estimated Needs Total Energy Estimated Needs: 1860-2100 kcal Total Protein Estimated Needs: 85-105 grams Total Fluid Estimated Needs: >/= 2 L/day  On 07/11/2021: 1800-2000 kcal 90-105 grams protein  Current Nutrition: soft diet, TPN (chronic)  Plan:  Patient discharging today, no inpatient TPN order needed. Will resume outpatient TPN management at discharge. Discussed with MD.  MaLenis NoonPharmD 09/29/21 9:40 AM

## 2021-10-02 ENCOUNTER — Telehealth: Payer: Self-pay | Admitting: Internal Medicine

## 2021-10-02 ENCOUNTER — Telehealth: Payer: Self-pay

## 2021-10-02 ENCOUNTER — Telehealth: Payer: Self-pay | Admitting: Pharmacist

## 2021-10-02 NOTE — Telephone Encounter (Signed)
Transition Care Management Unsuccessful Follow-up Telephone Call  Date of discharge and from where:  09/29/2021 / [facility]  Attempts:  1st Attempt  Reason for unsuccessful TCM follow-up call:  Unable to leave message.  Mailbox is full.  Quinn Plowman RN,BSN,CCM RN Case Manager [place] [phone]

## 2021-10-02 NOTE — Chronic Care Management (AMB) (Signed)
Chronic Care Management Pharmacy Assistant   Name: Caitlin Peters  MRN: [id number] DOB: Nov 01, 1961  10/02/21 APPOINTMENT REMINDER   Patient was reminded to have all medications, supplements and any blood glucose and blood pressure readings available for review with [name], Pharm. D, for telephone visit on 10/03/21 at 10:45.  Care Gaps: BP- 170/68 (Hosp 11/4) AWV- 2/22  Star Rating Drug: None  Any gaps in medications fill history? No   Medications: Outpatient Encounter Medications as of 10/02/2021  Medication Sig Note   amLODipine (NORVASC) 10 MG tablet Take 1 tablet by mouth once daily (Patient taking differently: Take 10 mg by mouth daily.)    buPROPion ER (WELLBUTRIN SR) 100 MG 12 hr tablet Take 2 tablets by mouth once daily (Patient taking differently: Take 200 mg by mouth daily.)    Calcium 200 MG TABS Take 200 mg by mouth daily.    carvedilol (COREG) 12.5 MG tablet Take 1 tablet (12.5 mg total) by mouth 2 (two) times daily with a meal.    cholecalciferol (VITAMIN D3) 25 MCG (1000 UT) tablet Take 1,000 Units by mouth daily.     cycloSPORINE (RESTASIS) 0.05 % ophthalmic emulsion Place 1 drop into both eyes 2 (two) times daily.    DEXILANT 60 MG capsule Take 1 capsule by mouth once daily (Patient taking differently: Take 60 mg by mouth daily.)    DULoxetine (CYMBALTA) 60 MG capsule Take 2 capsules (120 mg total) by mouth daily.    estradiol (ESTRACE) 2 MG tablet Take 1 tablet by mouth once daily (Patient taking differently: Take 2 mg by mouth daily.)    famotidine (PEPCID) 20 MG tablet Take 20 mg by mouth daily as needed for heartburn or indigestion. 09/25/2021: Hasn't needed to take yet   GATTEX 5 MG KIT Inject 1.5 mg into the skin daily. 09/25/2021: Patient has at bedside   hydrALAZINE (APRESOLINE) 100 MG tablet Take 1 tablet (100 mg total) by mouth 3 (three) times daily.    HYDROmorphone (DILAUDID) 4 MG tablet Take 4 mg by mouth 4 (four) times daily as needed for  moderate pain or severe pain.    levETIRAcetam (KEPPRA) 500 MG tablet Take 1 tablet (500 mg total) by mouth 2 (two) times daily.    Levofloxacin (LEVAQUIN) 250 MG/50ML SOLN Inject 50 mLs (250 mg total) into the vein daily for 5 days.    lidocaine (LIDODERM) 5 % Place 1 patch onto the skin daily. Remove & Discard patch within 12 hours or as directed by MD (Patient not taking: Reported on 09/25/2021)    lipase/protease/amylase (CREON) 36000 UNITS CPEP capsule Take 1 capsule (36,000 Units total) by mouth See admin instructions. 36000 units with meals (Patient taking differently: Take 36,000 Units by mouth 3 (three) times daily with meals.)    Menthol, Topical Analgesic, (BIOFREEZE EX) Apply 1 application topically as needed (pain).    methadone (DOLOPHINE) 5 MG tablet Take 1 tablet (5 mg total) by mouth 5 (five) times daily.    methocarbamol (ROBAXIN) 500 MG tablet Take 1 tablet (500 mg total) by mouth in the morning and at bedtime.    Multiple Vitamins-Minerals (MULTIVITAMIN ADULT PO) Take 1 tablet by mouth daily.    NARCAN 4 MG/0.1ML LIQD nasal spray kit Place 1 spray into the nose once as needed (overdose). 09/25/2021: At home if needed   polyvinyl alcohol (LIQUIFILM TEARS) 1.4 % ophthalmic solution Place 1 drop into both eyes as needed for dry eyes.    predniSONE (DELTASONE)  10 MG tablet Take 4 tablets (40 mg total) by mouth daily for 3 days, THEN 3 tablets (30 mg total) daily for 3 days, THEN 2 tablets (20 mg total) daily for 3 days, THEN 1 tablet (10 mg total) daily for 3 days.    predniSONE (DELTASONE) 10 MG tablet Take 4 tablets (40 mg total) by mouth daily for 3 days, THEN 3 tablets (30 mg total) daily for 3 days, THEN 2 tablets (20 mg total) daily for 3 days, THEN 1 tablet (10 mg total) daily for 3 days.    PRESCRIPTION MEDICATION Inject 1 each into the vein daily. Home TPN . [facility] [place] . 1 bag for 12 hours. [phone] 09/25/2021: Runs 1800 to 0600- took off this  morning at 0600   promethazine (PHENERGAN) 25 MG tablet TAKE 1 TABLET BY MOUTH EVERY 6 HOURS AS NEEDED FOR NAUSEA FOR VOMITING (Patient taking differently: Take 25 mg by mouth every 6 (six) hours as needed for nausea (or vomiting).)    sodium chloride 0.9 % infusion Inject 1,000 mLs into the vein daily. For chronic dehydration    sucralfate (CARAFATE) 1 GM/10ML suspension Take 10 mLs (1 g total) by mouth 2 (two) times daily.    Trace Minerals Cu-Mn-Se-Zn (TRALEMENT IV) Inject 1 mL into the vein See admin instructions. Used in TPN bag 4 times weekly 09/25/2021: Injected into TPN before start of infusion   vitamin B-12 (CYANOCOBALAMIN) 1000 MCG tablet Take 1,000 mcg by mouth daily.     No facility-administered encounter medications on file as of 10/02/2021.    [place] Clinical Pharmacist Assistant [phone]

## 2021-10-02 NOTE — Telephone Encounter (Signed)
Caitlin Peters from [facility] called to let Dr. Jerilee Hoh know that the patient was recently in the hospital and [facility] picked her up for services. Jackelyn Poling says they will continue to see her weekly.

## 2021-10-03 ENCOUNTER — Telehealth: Payer: Self-pay | Admitting: Pharmacist

## 2021-10-03 ENCOUNTER — Other Ambulatory Visit: Payer: Self-pay | Admitting: Internal Medicine

## 2021-10-03 ENCOUNTER — Telehealth: Payer: Medicare Other

## 2021-10-03 NOTE — Telephone Encounter (Signed)
  Chronic Care Management   Outreach Note  10/03/2021 Name: Caitlin Peters MRN: [id number] DOB: 19-Apr-1961  Referred by: [name], [name], MD  Patient had a phone appointment scheduled with clinical pharmacist today.  An unsuccessful telephone outreach was attempted today. The patient was referred to the pharmacist for assistance with care management and care coordination.   If possible, a message was left to return call to: [phone] or to [facility]: [phone]  Follow Up Plan: The care management team will reach out to the patient again over the next 30 days.    [name], PharmD, [place] Pharmacist [place] [facility] [place]

## 2021-10-03 NOTE — Telephone Encounter (Signed)
Noted  

## 2021-10-03 NOTE — Progress Notes (Deleted)
Chronic Care Management Pharmacy Note  10/03/2021 Name:  Caitlin Peters MRN:  [id number] DOB:  06/11/1961  Summary: ***  Recommendations/Changes made from today's visit: ***  Plan: ***  Subjective: Caitlin Peters is an 60 y.o. year old female who is a primary patient of [name], [name], MD.  The CCM team was consulted for assistance with disease management and care coordination needs.    Engaged with patient by telephone for follow up visit in response to provider referral for pharmacy case management and/or care coordination services.   Consent to Services:  The patient was given information about Chronic Care Management services, agreed to services, and gave verbal consent prior to initiation of services.  Please see initial visit note for detailed documentation.   Patient Care Team: [name], [name], MD as PCP - General (Internal Medicine) Viona Gilmore, [facility] as Pharmacist (Pharmacist) Brayton Layman, MD as Referring Physician (Gastroenterology) Ted Mcalpine, MD as Consulting Physician (Internal Medicine) Cameron Sprang, MD as Consulting Physician (Neurology)  Recent office visits: 09/15/21 Domingo Mend, MD: Patient presented for chronic conditions follow up. BP elevated in office. Influenza vaccine administered.  08/02/21 Domingo Mend, MD: Patient presented for hospitalization follow up.  No medication changes.  06/13/21 Domingo Mend, MD: Patient presented for hospitalization follow up. Prescribed methocarbamol 500 mg BID and administered steroid injection. Placed referral to ortho.  Recent consult visits: 09/19/21 Brayton Layman, MD (gastro): Patient presented for follow up for Crohn's disease. 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 Continued Gattex 1.5 mg daily.  08/28/21 Ellouise Newer, MD (neurology): Patient presented for epilepsy follow up. Continue Keppra 500 mg BID.  08/01/21 Carlyle Basques, MD (ID):  Patient presented for central venous line infection follow up.   06/19/21 Carlyle Basques, MD (ID): Patient presented for central venous line infection follow up.   05/22/21 Carlyle Basques, MD (ID): Patient presented for central venous line infection follow up.   11/22/20 Rudean Haskell, MD (cardiology): Patient presented for palpitations. Increased hydralazine to 75 mg TID.  11/14/21 Alcide Evener, MD (ID): Patient presented for central venous line infection follow up.   Hospital visits: 09/25/21-09/29/21 Patient admitted to [facility] for sepsis due to diffuse colitis. Recommended hold of Methadone with use of Levaquin x 5 days. Carvedilol dose increased. Prescribed prednisone taper.  07/26/21 Patient presented to [facility] ED for abdominal pain.  8/10-8/19/22 Patient admitted to [facility] for sepsis.  7/25-06/30/21 Patient admitted to [facility] for sepsis.  6/27-6/30/22 Patient admitted to [facility] for abdominal pain.  6/11-6/21/22 Patient admitted to [facility] for sepsis.  Objective:  Lab Results  Component Value Date   CREATININE 1.56 (H) 09/29/2021   BUN 34 (H) 09/29/2021   GFR 28.52 (L) 05/24/2020   GFRNONAA 38 (L) 09/29/2021   GFRAA 55 (L) 08/12/2020   NA 137 09/29/2021   K 4.1 09/29/2021   CALCIUM 9.0 09/29/2021   CO2 31 09/29/2021    Lab Results  Component Value Date/Time   HGBA1C 4.5 (L) 09/26/2021 02:32 AM   HGBA1C 5.0 05/22/2021 03:15 PM   GFR 28.52 (L) 05/24/2020 04:10 PM   GFR 41.16 (L) 10/16/2019 03:02 PM    Last diabetic Eye exam: No results found for: HMDIABEYEEXA  Last diabetic Foot exam: No results found for: HMDIABFOOTEX   Lab Results  Component Value Date   TRIG 45 09/27/2021    Hepatic Function Latest Ref Rng & Units 09/28/2021 09/27/2021  09/26/2021  Total Protein 6.5 - 8.1 g/dL 5.8(L) 5.7(L) 5.5(L)  Albumin 3.5 - 5.0 g/dL 2.3(L) 2.3(L) 2.4(L)  AST 15 - 41 U/L 15 16 18   ALT 0 - 44 U/L 18 18  19   Alk Phosphatase 38 - 126 U/L 105 116 118  Total Bilirubin 0.3 - 1.2 mg/dL 0.3 0.3 0.7  Bilirubin, Direct 0.0 - 0.2 mg/dL - - -    Lab Results  Component Value Date/Time   TSH 0.285 (L) 07/05/2021 10:02 PM   TSH 0.49 04/07/2021 12:00 AM   TSH 0.362 12/15/2020 01:06 AM   TSH 0.54 05/24/2020 04:10 PM   TSH 0.54 10/16/2019 03:02 PM    CBC Latest Ref Rng & Units 09/27/2021 09/27/2021 09/26/2021  WBC 4.0 - 10.5 K/uL 4.0 SPECIMEN CONTAMINATED, UNABLE TO PERFORM TEST(S). 5.6  Hemoglobin 12.0 - 15.0 g/dL 7.5(L) SPECIMEN CONTAMINATED, UNABLE TO PERFORM TEST(S). 7.3(L)  Hematocrit 36.0 - 46.0 % 22.3(L) SPECIMEN CONTAMINATED, UNABLE TO PERFORM TEST(S). 22.0(L)  Platelets 150 - 400 K/uL 140(L) SPECIMEN CONTAMINATED, UNABLE TO PERFORM TEST(S). 120(L)    Lab Results  Component Value Date/Time   VD25OH 20.9 04/07/2021 12:00 AM   VD25OH 44.31 05/24/2020 04:10 PM   VD25OH 53.60 10/16/2019 03:02 PM    Clinical ASCVD: No  The ASCVD Risk score (Arnett DK, et al., 2019) failed to calculate for the following reasons:   Cannot find a previous HDL lab   Cannot find a previous total cholesterol lab    Depression screen [facility] 2/9 08/01/2021 08/01/2021 05/22/2021  Decreased Interest 0 0 0  Down, Depressed, Hopeless 1 1 0  PHQ - 2 Score 1 1 0  Altered sleeping 0 - -  Tired, decreased energy 0 - -  Change in appetite 0 - -  Feeling bad or failure about yourself  0 - -  Trouble concentrating 0 - -  Moving slowly or fidgety/restless 0 - -  Suicidal thoughts 0 - -  PHQ-9 Score 1 - -  Difficult doing work/chores - - -  Some recent data might be hidden     Social History   Tobacco Use  Smoking Status Former  Smokeless Tobacco Never   BP Readings from Last 3 Encounters:  09/29/21 (!) 170/68  09/15/21 (!) 160/80  08/28/21 (!) 90/50   Pulse Readings from Last 3 Encounters:  09/29/21 80  09/15/21 72  08/28/21 70   Wt Readings from Last 3 Encounters:  09/25/21 137 lb (62.1 kg)  09/15/21 136 lb  3.2 oz (61.8 kg)  08/28/21 131 lb 3.2 oz (59.5 kg)    Assessment/Interventions: Review of patient past medical history, allergies, medications, health status, including review of consultants reports, laboratory and other test data, was performed as part of comprehensive evaluation and provision of chronic care management services.   SDOH:  (Social Determinants of Health) assessments and interventions performed: No   CCM Care Plan  Allergies  Allergen Reactions   Meperidine Hives    Other reaction(s): GI Upset Due to [place]    Hyoscyamine Hives and Swelling    Legs swelling Disorientation   Cefepime Other (See Comments)    Neurotoxicity occurring in setting of AKI. Ceftriaxone tolerated during same admit   Gabapentin Other (See Comments)    unknown   Lyrica [Pregabalin] Other (See Comments)    Has chronic dehydration and made it worse   Topamax [Topiramate] Other (See Comments)    Has chronic dehydration and made it worse   Zosyn [Piperacillin Sod-Tazobactam So] Other (See  Comments)    Patient reports it makes her vomit, her neck stiff, and her "heart feel funny" Affected kidneys   Fentanyl Rash    Pt is allergic to fentanyl patch related to the glue (gives her a rash) Pt states she is NOT allergic to fentanyl IV medicine   Morphine And Related Rash    Medications Reviewed Today     Reviewed by Bonnita Hollow, CPhT (Pharmacy Technician) on 09/25/21 at [place] List Status: Complete   Medication Order Taking? Sig Documenting Provider Last Dose Status Informant  amLODipine (NORVASC) 10 MG tablet [id number] Yes Take 1 tablet by mouth once daily  Patient taking differently: Take 10 mg by mouth daily.   [name], [name], MD 09/25/2021 am Active Self  buPROPion ER [facility] SR) 100 MG 12 hr tablet [id number] Yes Take 2 tablets by mouth once daily  Patient taking differently: Take 200 mg by mouth daily.   [name], [name], MD 09/25/2021 am Active Self   Calcium 200 MG TABS [id number] Yes Take 200 mg by mouth daily. [provider] 09/25/2021 am Active Self  carvedilol (COREG) 6.25 MG tablet [id number] Yes Take 1 tablet (6.25 mg total) by mouth 2 (two) times daily with a meal. Nita Sells, MD 09/25/2021 1000 Active Self  cholecalciferol (VITAMIN D3) 25 MCG (1000 UT) tablet [id number] Yes Take 1,000 Units by mouth daily.  [provider] 09/25/2021 am Active Self  cycloSPORINE (RESTASIS) 0.05 % ophthalmic emulsion [id number] Yes Place 1 drop into both eyes 2 (two) times daily. [provider] 09/25/2021 am Active Self  DEXILANT 60 MG capsule [id number] Yes Take 1 capsule by mouth once daily  Patient taking differently: Take 60 mg by mouth daily.   [name], [name], MD 09/25/2021 am Active Self  DULoxetine (CYMBALTA) 60 MG capsule [id number] Yes Take 2 capsules (120 mg total) by mouth daily. [name], [name], MD 09/25/2021 am Active Self  estradiol (ESTRACE) 2 MG tablet [id number] Yes Take 1 tablet by mouth once daily  Patient taking differently: Take 2 mg by mouth daily.   [name], [name], MD 09/25/2021 am Active Self  famotidine (PEPCID) 20 MG tablet [id number]  Take 20 mg by mouth daily as needed for heartburn or indigestion. [provider]  Active Self           Med Note Dema Severin, Waynard Reeds Sep 25, 2021 10:19 PM) Hasn't needed to take yet  GATTEX 5 MG KIT [id number] Yes Inject 1.5 mg into the skin daily. [provider] 09/25/2021 1000 Active Self           Med Note (WHITE, Waynard Reeds Sep 25, 2021 10:20 PM) Patient has at bedside  hydrALAZINE (APRESOLINE) 100 MG tablet [id number] Yes Take 1 tablet (100 mg total) by mouth 3 (three) times daily. Eugenie Filler, MD 09/25/2021 afternoon Active Self  HYDROmorphone (DILAUDID) 4 MG tablet [id number] Yes Take 4 mg by mouth 4 (four) times daily as needed for moderate pain or severe pain. [provider]  09/25/2021 afternoon Active Self  levETIRAcetam (KEPPRA) 500 MG tablet [id number] Yes Take 1 tablet (500 mg total) by mouth 2 (two) times daily. Cameron Sprang, MD 09/25/2021 am Active Self  lidocaine (LIDODERM) 5 % [id number] No Place 1 patch onto the skin daily. Remove & Discard patch within 12 hours or as directed by MD  Patient not taking: Reported on 09/25/2021   Eugenie Filler,  MD Not Taking Active Self  lipase/protease/amylase (CREON) 36000 UNITS CPEP capsule [id number] Yes Take 1 capsule (36,000 Units total) by mouth See admin instructions. 36000 units with meals  Patient taking differently: Take 36,000 Units by mouth 3 (three) times daily with meals.   Eugenie Filler, MD 09/25/2021 morning Active   Menthol, Topical Analgesic, (BIOFREEZE EX) [id number] Yes Apply 1 application topically as needed (pain). [provider] Past Month Active Self  methadone (DOLOPHINE) 5 MG tablet [id number] Yes Take 1 tablet (5 mg total) by mouth 5 (five) times daily. Florencia Reasons, MD 09/25/2021 afternoon Active Self           Med Note (SUTPHIN, CHASIE F   Wed Jun 21, 2021  8:41 AM)    methocarbamol (ROBAXIN) 500 MG tablet [id number] Yes Take 1 tablet (500 mg total) by mouth in the morning and at bedtime. [name], [name], MD 09/25/2021 am Active Self  Multiple Vitamins-Minerals (MULTIVITAMIN ADULT PO) [id number] Yes Take 1 tablet by mouth daily. [provider] 09/25/2021 am Active Self  NARCAN 4 MG/0.1ML LIQD nasal spray kit [id number] No Place 1 spray into the nose once as needed (overdose). [provider] NEVER USED Active Self           Med Note Dema Severin, Waynard Reeds Sep 25, 2021 10:23 PM) At home if needed  polyvinyl alcohol (LIQUIFILM TEARS) 1.4 % ophthalmic solution [id number] Yes Place 1 drop into both eyes as needed for dry eyes. [provider] 09/25/2021 afternoon Active Self  PRESCRIPTION MEDICATION [id number] Yes Inject 1 each into the vein daily. Home TPN  . [facility] [place] [facility] [place] . 1 bag for 12 hours. [phone] [provider] 09/25/2021 0600 Active Self           Med Note (WHITE, Waynard Reeds Sep 25, 2021 10:25 PM) Runs 1800 to 0600- took off this morning at 0600  promethazine (PHENERGAN) 25 MG tablet [id number] Yes TAKE 1 TABLET BY MOUTH EVERY 6 HOURS AS NEEDED FOR NAUSEA FOR VOMITING  Patient taking differently: Take 25 mg by mouth every 6 (six) hours as needed for nausea (or vomiting).   [name], [name], MD Past Month Active Self  sodium chloride 0.9 % infusion [id number] Yes Inject 1,000 mLs into the vein daily. For chronic dehydration [provider] 09/24/2021 am Active Self           Med Note Alesia Banda, CHASIE F   Mon Nov 07, 2020  7:53 AM)    sucralfate (CARAFATE) 1 GM/10ML suspension [id number] Yes Take 10 mLs (1 g total) by mouth 2 (two) times daily. Eugenie Filler, MD 09/25/2021 am Active Self  Trace Minerals Cu-Mn-Se-Zn (TRALEMENT IV) [id number] Yes Inject 1 mL into the vein See admin instructions. Used in TPN bag 4 times weekly [provider] 09/24/2021 pm Active Self           Med Note (WHITE, Waynard Reeds Sep 25, 2021 10:28 PM) Injected into TPN before start of infusion  vitamin B-12 (CYANOCOBALAMIN) 1000 MCG tablet [id number] Yes Take 1,000 mcg by mouth daily.  [provider] 09/25/2021 am Active Self            Patient Active Problem List   Diagnosis Date Noted   Vertebral osteomyelitis ([place])    Bloodstream infection due to central venous catheter    Thrombocytopenia ([name])    Malnutrition of moderate degree 07/06/2021   Transaminitis  Hypophosphatemia    Chronic idiopathic constipation    Left shoulder pain 05/23/2021   Elevated LFTs    Sepsis ([place]) 05/06/2021   Acidosis 04/08/2021   Colitis 04/07/2021   Intractable nausea and vomiting 03/11/2021   Thoracic discitis 02/10/2021   Pressure injury of skin 12/18/2020   Bacteremia 12/17/2020    Seizures ([place]) 12/17/2020   Drug-seeking behavior 12/16/2020   Seizure ([place]) 12/15/2020   CKD (chronic kidney disease) stage 3, GFR 30-59 ml/min ([name]) 12/06/2020   COVID-19 virus infection 12/06/2020   Protein calorie malnutrition ([place]) 12/06/2020   Palpitations 11/22/2020   PAC (premature atrial contraction) 11/22/2020   History of central line-associated bloodstream infection (CLABSI) 10/21/2020   Nausea & vomiting 10/21/2020   Choledocholithiasis (sludge) s/p ERCP 10/2019 10/21/2020   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   Abdominal pain 98/33/8250   Acute metabolic encephalopathy    Hypomagnesemia    Partial small bowel obstruction ([name])    Bacteremia associated with intravascular line ([place]) 08/04/2020   Enterobacter sepsis ([place]) 07/22/2020   Fever of unknown origin 06/27/2020   Anxiety 06/03/2020   Acute pancreatitis 04/13/2020   Infection due to Acinetobacter species 03/10/2020   Pancytopenia ([place]) 03/05/2020   Central line infection    Elevated liver enzymes 01/02/2020   Cholangitis    Anasarca 10/28/2019   Acute kidney injury superimposed on chronic kidney disease ([place]) 10/28/2019   Falls 10/28/2019   Malnutrition ([place])    Vitamin B12 deficiency    GERD (gastroesophageal reflux disease)    Chronic pain syndrome    Hypokalemia due to excessive gastrointestinal loss of potassium 10/13/2019   Fever    Hypokalemia 10/12/2019   Chronic diarrhea 10/12/2019   Dysuria 10/12/2019   Bilateral lower extremity edema 10/12/2019   Hypertension 10/12/2019   AKI (acute kidney injury) ([place]) 10/12/2019   Candidemia ([place]) 08/27/2019   IDA (iron deficiency anemia) 11/03/2018   Dilation of biliary tract 08/28/2018   Severe diarrhea 03/07/2018   LFTs abnormal 01/27/2018   GI tract obstruction ([name]) 01/27/2018   Gram-negative bacteremia 09/13/2017   HTN (hypertension), benign 12/02/2016   Intractable pain 12/02/2016   Anemia 12/02/2016   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    Avascular necrosis ([name]) 06/14/2015   Polyarthralgia 05/03/2015   On total parenteral nutrition (TPN) 12/30/2014   Osteoporosis 12/24/2014   Low back pain 12/16/2014   Vitamin D deficiency 12/16/2014   Short gut syndrome 07/15/2014   History of colonic diverticulitis 2014   Depression 07/24/2012   Gram-positive bacteremia 07/24/2012   Small bowel obstruction due to adhesions ([place]) 07/24/2012   Diarrhea 12/13/2011   Neuralgia and neuritis 06/01/2011   Myalgia and myositis 08/12/2003   Crohn's disease of both small and large intestine with other complication ([place]) 5397    Immunization History  Administered Date(s) Administered   Fluad Quad(high Dose 65+) 09/15/2021   Hep A / Hep B 01/25/2015   Hepatitis A, Adult 01/19/2015   Hepatitis B, adult 01/19/2015   Influenza Nasal 07/28/2019   Influenza Split 08/03/2014, 09/28/2014   Influenza,inj,Quad PF,6+ Mos 08/19/2015, 08/02/2016, 08/19/2017, 09/02/2018, 08/27/2019, 09/19/2020   Influenza-Unspecified 07/28/2019, 09/19/2020   PFIZER(Purple Top)SARS-COV-2 Vaccination 02/25/2020, 03/21/2020, 09/19/2020   Pneumococcal Conjugate-13 12/30/2014   Pneumococcal Polysaccharide-23 07/12/2011   Td 10/15/2010   Zoster, Live 04/26/2014    Conditions to be addressed/monitored:  Hypertension, GERD, Chronic Kidney Disease, Depression, Osteoporosis and chronic pain and crohn's disease  Conditions addressed this visit: ***  There are no care plans that you recently modified to display  for this patient.    Medication Assistance: None required.  Patient affirms current coverage meets needs.  Compliance/Adherence/Medication fill history: Care Gaps: Pap smear BP- 118/70  Star-Rating Drugs: Losartan (Cozaar) 25 mg - Last filled 09/15/21 90 DS at [facility]  Patient's preferred pharmacy is:  [facility], [address] [place] [address] Phone: [phone] Fax: [phone]  Uses  pill box? Yes Pt endorses 100% compliance  We discussed: Current pharmacy is preferred with insurance plan and patient is satisfied with pharmacy services Patient decided to: Continue current medication management strategy  Care Plan and Follow Up Patient Decision:  Patient agrees to Care Plan and Follow-up.  Plan: Telephone follow up appointment with care management team member scheduled for:  6 months  [name], PharmD [place] Pharmacist [place] at [facility] [phone]

## 2021-10-05 LAB — CBC AND DIFFERENTIAL
HCT: 25 — AB (ref 36–46)
Hemoglobin: 8.4 — AB (ref 12.0–16.0)
Neutrophils Absolute: 5229
Platelets: 168 (ref 150–399)
WBC: 6.4

## 2021-10-05 LAB — CBC: RBC: 2.63 — AB (ref 3.87–5.11)

## 2021-10-05 LAB — HEPATIC FUNCTION PANEL
ALT: 20 (ref 7–35)
AST: 15 (ref 13–35)
Alkaline Phosphatase: 88 (ref 25–125)
Bilirubin, Total: 0.3

## 2021-10-05 LAB — BASIC METABOLIC PANEL
BUN: 45 — AB (ref 4–21)
CO2: 27 — AB (ref 13–22)
Chloride: 105 (ref 99–108)
Creatinine: 1.8 — AB (ref 0.5–1.1)
Glucose: 100
Potassium: 4.3 (ref 3.4–5.3)
Sodium: 139 (ref 137–147)

## 2021-10-05 LAB — COMPREHENSIVE METABOLIC PANEL
Albumin: 3.1 — AB (ref 3.5–5.0)
Calcium: 8.6 — AB (ref 8.7–10.7)
Globulin: 2.6

## 2021-10-06 ENCOUNTER — Telehealth: Payer: Self-pay

## 2021-10-06 NOTE — Telephone Encounter (Signed)
Transition Care Management Unsuccessful Follow-up Telephone Call  Date of discharge and from where:  09/29/2021 / [facility]  Attempts:  2nd Attempt  Reason for unsuccessful TCM follow-up call:  HIPAA compliant. Left voice message  Quinn Plowman RN,BSN,CCM RN Case Manager [place] [phone]

## 2021-10-07 ENCOUNTER — Other Ambulatory Visit: Payer: Self-pay

## 2021-10-07 ENCOUNTER — Inpatient Hospital Stay (HOSPITAL_COMMUNITY)
Admission: EM | Admit: 2021-10-07 | Discharge: 2021-10-16 | DRG: 314 | Disposition: A | Discharge status: Home Health | Payer: Medicare Other | Attending: Internal Medicine | Admitting: Internal Medicine

## 2021-10-07 ENCOUNTER — Emergency Department (HOSPITAL_COMMUNITY): Payer: Medicare Other

## 2021-10-07 DIAGNOSIS — A4159 Other Gram-negative sepsis: Secondary | ICD-10-CM | POA: Diagnosis present

## 2021-10-07 DIAGNOSIS — K90829 Short bowel syndrome, unspecified: Secondary | ICD-10-CM | POA: Diagnosis present

## 2021-10-07 DIAGNOSIS — I10 Essential (primary) hypertension: Secondary | ICD-10-CM | POA: Diagnosis present

## 2021-10-07 DIAGNOSIS — E876 Hypokalemia: Secondary | ICD-10-CM | POA: Diagnosis not present

## 2021-10-07 DIAGNOSIS — I129 Hypertensive chronic kidney disease with stage 1 through stage 4 chronic kidney disease, or unspecified chronic kidney disease: Secondary | ICD-10-CM | POA: Diagnosis present

## 2021-10-07 DIAGNOSIS — M810 Age-related osteoporosis without current pathological fracture: Secondary | ICD-10-CM | POA: Diagnosis present

## 2021-10-07 DIAGNOSIS — K56600 Partial intestinal obstruction, unspecified as to cause: Secondary | ICD-10-CM

## 2021-10-07 DIAGNOSIS — Y848 Other medical procedures as the cause of abnormal reaction of the patient, or of later complication, without mention of misadventure at the time of the procedure: Secondary | ICD-10-CM | POA: Diagnosis present

## 2021-10-07 DIAGNOSIS — F419 Anxiety disorder, unspecified: Secondary | ICD-10-CM | POA: Diagnosis present

## 2021-10-07 DIAGNOSIS — K56609 Unspecified intestinal obstruction, unspecified as to partial versus complete obstruction: Secondary | ICD-10-CM | POA: Diagnosis present

## 2021-10-07 DIAGNOSIS — D649 Anemia, unspecified: Secondary | ICD-10-CM | POA: Diagnosis present

## 2021-10-07 DIAGNOSIS — F32A Depression, unspecified: Secondary | ICD-10-CM | POA: Diagnosis present

## 2021-10-07 DIAGNOSIS — K50818 Crohn's disease of both small and large intestine with other complication: Secondary | ICD-10-CM | POA: Diagnosis present

## 2021-10-07 DIAGNOSIS — N183 Chronic kidney disease, stage 3 unspecified: Secondary | ICD-10-CM | POA: Diagnosis present

## 2021-10-07 DIAGNOSIS — Z888 Allergy status to other drugs, medicaments and biological substances status: Secondary | ICD-10-CM

## 2021-10-07 DIAGNOSIS — K8689 Other specified diseases of pancreas: Secondary | ICD-10-CM | POA: Diagnosis present

## 2021-10-07 DIAGNOSIS — R7989 Other specified abnormal findings of blood chemistry: Secondary | ICD-10-CM | POA: Diagnosis present

## 2021-10-07 DIAGNOSIS — Z79899 Other long term (current) drug therapy: Secondary | ICD-10-CM

## 2021-10-07 DIAGNOSIS — D696 Thrombocytopenia, unspecified: Secondary | ICD-10-CM | POA: Diagnosis present

## 2021-10-07 DIAGNOSIS — E46 Unspecified protein-calorie malnutrition: Secondary | ICD-10-CM | POA: Diagnosis present

## 2021-10-07 DIAGNOSIS — K5080 Crohn's disease of both small and large intestine without complications: Secondary | ICD-10-CM | POA: Diagnosis present

## 2021-10-07 DIAGNOSIS — R601 Generalized edema: Secondary | ICD-10-CM | POA: Diagnosis present

## 2021-10-07 DIAGNOSIS — Z881 Allergy status to other antibiotic agents status: Secondary | ICD-10-CM

## 2021-10-07 DIAGNOSIS — Z87891 Personal history of nicotine dependence: Secondary | ICD-10-CM

## 2021-10-07 DIAGNOSIS — N184 Chronic kidney disease, stage 4 (severe): Secondary | ICD-10-CM | POA: Diagnosis present

## 2021-10-07 DIAGNOSIS — T80211A Bloodstream infection due to central venous catheter, initial encounter: Principal | ICD-10-CM | POA: Diagnosis present

## 2021-10-07 DIAGNOSIS — Z9049 Acquired absence of other specified parts of digestive tract: Secondary | ICD-10-CM

## 2021-10-07 DIAGNOSIS — R509 Fever, unspecified: Secondary | ICD-10-CM

## 2021-10-07 DIAGNOSIS — Z8249 Family history of ischemic heart disease and other diseases of the circulatory system: Secondary | ICD-10-CM

## 2021-10-07 DIAGNOSIS — E440 Moderate protein-calorie malnutrition: Secondary | ICD-10-CM | POA: Diagnosis present

## 2021-10-07 DIAGNOSIS — Z20822 Contact with and (suspected) exposure to covid-19: Secondary | ICD-10-CM | POA: Diagnosis present

## 2021-10-07 DIAGNOSIS — N1832 Chronic kidney disease, stage 3b: Secondary | ICD-10-CM | POA: Diagnosis present

## 2021-10-07 DIAGNOSIS — Z789 Other specified health status: Secondary | ICD-10-CM | POA: Diagnosis present

## 2021-10-07 DIAGNOSIS — Z79818 Long term (current) use of other agents affecting estrogen receptors and estrogen levels: Secondary | ICD-10-CM

## 2021-10-07 DIAGNOSIS — R7881 Bacteremia: Secondary | ICD-10-CM

## 2021-10-07 DIAGNOSIS — D509 Iron deficiency anemia, unspecified: Secondary | ICD-10-CM | POA: Diagnosis present

## 2021-10-07 DIAGNOSIS — G894 Chronic pain syndrome: Secondary | ICD-10-CM | POA: Diagnosis present

## 2021-10-07 DIAGNOSIS — Z681 Body mass index (BMI) 19 or less, adult: Secondary | ICD-10-CM

## 2021-10-07 DIAGNOSIS — G40909 Epilepsy, unspecified, not intractable, without status epilepticus: Secondary | ICD-10-CM | POA: Diagnosis present

## 2021-10-07 DIAGNOSIS — K912 Postsurgical malabsorption, not elsewhere classified: Secondary | ICD-10-CM | POA: Diagnosis present

## 2021-10-07 DIAGNOSIS — B961 Klebsiella pneumoniae [K. pneumoniae] as the cause of diseases classified elsewhere: Secondary | ICD-10-CM | POA: Diagnosis present

## 2021-10-07 DIAGNOSIS — Z885 Allergy status to narcotic agent status: Secondary | ICD-10-CM

## 2021-10-07 DIAGNOSIS — K219 Gastro-esophageal reflux disease without esophagitis: Secondary | ICD-10-CM | POA: Diagnosis present

## 2021-10-07 DIAGNOSIS — Z8619 Personal history of other infectious and parasitic diseases: Secondary | ICD-10-CM | POA: Diagnosis present

## 2021-10-07 MED ORDER — ACETAMINOPHEN 500 MG PO TABS
1000.0000 mg | ORAL_TABLET | Freq: Once | ORAL | Status: AC
  Administered 2021-10-07: 1000 mg via ORAL
  Filled 2021-10-07: qty 2

## 2021-10-07 MED ORDER — ONDANSETRON HCL 4 MG/2ML IJ SOLN
4.0000 mg | Freq: Once | INTRAMUSCULAR | Status: AC
  Administered 2021-10-08: 4 mg via INTRAVENOUS
  Filled 2021-10-07: qty 2

## 2021-10-07 MED ORDER — HYDROMORPHONE HCL 1 MG/ML IJ SOLN
1.0000 mg | Freq: Once | INTRAMUSCULAR | Status: AC
  Administered 2021-10-08: 1 mg via INTRAVENOUS
  Filled 2021-10-07: qty 1

## 2021-10-07 NOTE — ED Triage Notes (Signed)
Pt came from home via EMS. C/c: abd pain, bilateral leg pain unrelieved by home meds, 103.3 temp with EMS. Pt states she started feeling sick this AM. Pt has a hx of frequent infections, pt took PO tylenol at 1900 without fever relief CBG: 108 98% on RA 90 bpm 170/90

## 2021-10-07 NOTE — ED Provider Notes (Signed)
[place] DEPT Provider Note   CSN: [id number] Arrival date & time: 10/07/21  2304     History Chief Complaint  Patient presents with   Fever   Abdominal Pain    Caitlin Peters is a 60 y.o. female.  The history is provided by the patient and medical records.   60 year old female with history of chronic pain, CKD, Crohn's disease with multiple resections, short gut syndrome, GERD, depression, chronic malnutrition on TPN, presenting to the ED for fever.  Patient recently hospitalized for line sepsis  and colitis 10/31-11/4.  States she started feeling better for a few days, was back to her usual activity.  States last evening she started feeling a little more tired and weaker than usual, today developed fever and increased abdominal pain.  States it feels like when she was here last admission.  She reports stool frequency has decreased but she is have urinary frequency with occasional accidents so has been wearing brief and protective pads.  Some nausea/vomiting.  Her PICC line was recently changed during last hospitalization, no issue with it since discharge.  Past Medical History:  Diagnosis Date   Acute pancreatitis 04/13/2020   Anasarca 10/2019   AVN (avascular necrosis of bone) ([name])    Cataract    Choledocholithiasis (sludge) s/p ERCP 10/2019 10/21/2020   Chronic pain syndrome    CKD (chronic kidney disease), stage III ([place])    Crohn disease ([place])    Crohn's disease of small & large intestine with SGS 1984   Caitlin Peters is a 60 year old female with a history of Crohn's disease diagnosed in 44 (age 24), history of long-term steroid use with osteoporosis, S/P multiple bowel resections ([id number]) complicated by chronic back and abdominal pain, steatorrhea and short bowel syndrome. The patient has been left with ~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 attached to proximal transverse colon through rectum. 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; 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; jejunal to transverse colon anastomosis Treated at [facility] lengthening to 165cm Dr Alene Mires, [facility]   Vitamin B12 deficiency     Patient Active Problem List   Diagnosis Date Noted   Vertebral osteomyelitis ([place])    Bloodstream infection due to central venous catheter    Thrombocytopenia ([name])    Malnutrition of moderate degree 07/06/2021   Transaminitis    Hypophosphatemia    Chronic idiopathic constipation    Left shoulder pain 05/23/2021   Elevated LFTs    Sepsis ([place]) 05/06/2021   Acidosis 04/08/2021   Colitis 04/07/2021   Intractable nausea and vomiting 03/11/2021   Thoracic discitis 02/10/2021   Pressure injury of skin 12/18/2020   Bacteremia 12/17/2020   Seizures ([place]) 12/17/2020   Drug-seeking behavior 12/16/2020   Seizure ([place]) 12/15/2020   CKD (chronic kidney disease) stage 3, GFR 30-59 ml/min ([place]) 12/06/2020   COVID-19 virus infection 12/06/2020   Protein calorie malnutrition ([place]) 12/06/2020   Palpitations 11/22/2020   PAC (premature atrial contraction) 11/22/2020   History of central line-associated bloodstream infection ([facility]) 10/21/2020   Nausea &  vomiting 10/21/2020   Choledocholithiasis (sludge) s/p ERCP 10/2019 10/21/2020   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   Abdominal pain 70/96/2836   Acute metabolic encephalopathy    Hypomagnesemia    Partial small bowel obstruction ([name])    Bacteremia associated with intravascular line ([place]) 08/04/2020   Enterobacter sepsis ([place]) 07/22/2020   Fever of unknown origin 06/27/2020   Anxiety 06/03/2020   Acute pancreatitis 04/13/2020    Infection due to Acinetobacter species 03/10/2020   Pancytopenia ([place]) 03/05/2020   Central line infection    Elevated liver enzymes 01/02/2020   Cholangitis    Anasarca 10/28/2019   Acute kidney injury superimposed on chronic kidney disease ([place]) 10/28/2019   Falls 10/28/2019   Malnutrition ([place])    Vitamin B12 deficiency    GERD (gastroesophageal reflux disease)    Chronic pain syndrome    Hypokalemia due to excessive gastrointestinal loss of potassium 10/13/2019   Fever    Hypokalemia 10/12/2019   Chronic diarrhea 10/12/2019   Dysuria 10/12/2019   Bilateral lower extremity edema 10/12/2019   Hypertension 10/12/2019   AKI (acute kidney injury) ([place]) 10/12/2019   Candidemia ([place]) 08/27/2019   IDA (iron deficiency anemia) 11/03/2018   Dilation of biliary tract 08/28/2018   Severe diarrhea 03/07/2018   LFTs abnormal 01/27/2018   GI tract obstruction ([place]) 01/27/2018   Gram-negative bacteremia 09/13/2017   HTN (hypertension), benign 12/02/2016   Intractable pain 12/02/2016   Anemia 12/02/2016   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   Avascular necrosis ([place]) 06/14/2015   Polyarthralgia 05/03/2015   On total parenteral nutrition (TPN) 12/30/2014   Osteoporosis 12/24/2014   Low back pain 12/16/2014   Vitamin D deficiency 12/16/2014   Short gut syndrome 07/15/2014   History of colonic diverticulitis 2014   Depression 07/24/2012   Gram-positive bacteremia 07/24/2012   Small bowel obstruction due to adhesions ([place]) 07/24/2012   Diarrhea 12/13/2011   Neuralgia and neuritis 06/01/2011   Myalgia and myositis 08/12/2003   Crohn's disease of both small and large intestine with other complication ([place]) 6294    Past Surgical History:  Procedure Laterality Date   ABDOMINAL ADHESION SURGERY  01/22/2018   APPENDECTOMY  1989   BILIARY DILATION  11/26/2019   Procedure: BILIARY DILATION;  Surgeon: Jackquline Denmark, MD;  Location: [facility];  Service: Endoscopy;;   BILIARY DILATION   03/08/2020   Procedure: BILIARY DILATION;  Surgeon: Irving Copas., MD;  Location: [facility];  Service: Gastroenterology;;   BIOPSY  03/08/2020   Procedure: BIOPSY;  Surgeon: Irving Copas., MD;  Location: [facility]COPY;  Service: Gastroenterology;;   CHEST WALL RESECTION     right thoracotomy,resection of chest mass with anterior rib and reconstruction using prosthetic mesh and video arthroscopy   CHOLECYSTECTOMY  01/22/2018   COLONOSCOPY  2019   ENTEROSTOMY CLOSURE  04/1999   ERCP N/A 11/26/2019   Procedure: ENDOSCOPIC RETROGRADE CHOLANGIOPANCREATOGRAPHY (ERCP);  Surgeon: Jackquline Denmark, MD;  Location: [facility];  Service: Endoscopy;  Laterality: N/A;   ERCP N/A 03/08/2020   Procedure: ENDOSCOPIC RETROGRADE CHOLANGIOPANCREATOGRAPHY (ERCP);  Surgeon: Irving Copas., MD;  Location: [facility];  Service: Gastroenterology;  Laterality: N/A;   ESOPHAGOGASTRODUODENOSCOPY N/A 03/08/2020   Procedure: ESOPHAGOGASTRODUODENOSCOPY (EGD);  Surgeon: Irving Copas., MD;  Location: [facility];  Service: Gastroenterology;  Laterality: N/A;   EUS N/A 03/08/2020   Procedure: UPPER ENDOSCOPIC ULTRASOUND (EUS) LINEAR;  Surgeon: Irving Copas., MD;  Location: [facility];  Service: Gastroenterology;  Laterality: N/A;   ILEOCECETOMY  03/1999   ileocolon  resection with abdominal stoma   ILEOSTOMY CLOSURE  2001   IR FLUORO GUIDE CV LINE LEFT  01/07/2020   IR FLUORO GUIDE CV LINE LEFT  03/09/2020   IR FLUORO GUIDE CV LINE LEFT  05/09/2020   IR FLUORO GUIDE CV LINE LEFT  07/20/2020   IR FLUORO GUIDE CV LINE RIGHT  08/05/2020   IR FLUORO GUIDE CV LINE RIGHT  04/03/2021   IR PTA VENOUS EXCEPT DIALYSIS CIRCUIT  01/07/2020   IR REMOVAL TUN CV CATH W/O FL  08/05/2020   IR REMOVAL TUN CV CATH W/O FL  07/11/2021   IR US GUIDE VASC ACCESS LEFT     x 2 06/17/19 and 09/14/2019   IR US GUIDE VASC ACCESS RIGHT  08/05/2020   KNEE SURGERY     right knee    LAPAROSCOPIC SMALL BOWEL  RESECTION  2009   2000-2009.  SB resections for Crohns Disease - now with Short gut   OMENTECTOMY  01/22/2018   PARTIAL HYSTERECTOMY  1984   with LSO   REMOVAL OF STONES  11/26/2019   Procedure: REMOVAL OF STONES;  Surgeon: Jackquline Denmark, MD;  Location: [facility];  Service: Endoscopy;;   REMOVAL OF STONES  03/08/2020   Procedure: REMOVAL OF STONES;  Surgeon: Irving Copas., MD;  Location: [facility] ENDOSCOPY;  Service: Gastroenterology;;   SALPINGOOPHORECTOMY Left 1984   SALPINGOOPHORECTOMY Right 1990   SERIAL TRANSVERSE ENTEROPLASTY (STEP) - SMALL BOWEL LENGTHENING  01/22/2018   Dr Alene Mires, [facility] - SB length from 120 to 165cm    SMALL INTESTINE SURGERY  2002   SMALL INTESTINE SURGERY  2003   SPHINCTEROTOMY  11/26/2019   Procedure: SPHINCTEROTOMY;  Surgeon: Jackquline Denmark, MD;  Location: [facility];  Service: Endoscopy;;   TOTAL ABDOMINAL HYSTERECTOMY  1990   with RSO   UPPER GASTROINTESTINAL ENDOSCOPY       OB History   No obstetric history on file.     Family History  Problem Relation Age of Onset   Seizures Mother    Glaucoma Mother    CAD Father    Heart disease Father    Hypertension Father    Breast cancer Sister    Multiple sclerosis Sister    Diabetes Sister    Lupus Sister    Colon cancer Other    Crohn's disease Other     Social History   Tobacco Use   Smoking status: Former   Smokeless tobacco: Never  Scientific laboratory technician Use: Never used  Substance Use Topics   Alcohol use: Not Currently   Drug use: Never    Home Medications Prior to Admission medications   Medication Sig Start Date End Date Taking? Authorizing Provider  amLODipine (NORVASC) 10 MG tablet Take 1 tablet by mouth once daily Patient taking differently: Take 10 mg by mouth daily. 08/22/21   [name], [name], MD  buPROPion [facility] SR) 100 MG 12 hr tablet Take 2 tablets by mouth once daily Patient taking differently: Take 200 mg by mouth daily. 08/30/21    [name], [name], MD  Calcium 200 MG TABS Take 200 mg by mouth daily.    [provider]  carvedilol (COREG) 12.5 MG tablet Take 1 tablet (12.5 mg total) by mouth 2 (two) times daily with a meal. 09/29/21   Little Ishikawa, MD  cholecalciferol (VITAMIN D3) 25 MCG (1000 UT) tablet Take 1,000 Units by mouth daily.     [provider]  cycloSPORINE (RESTASIS) 0.05 % ophthalmic emulsion Place 1 drop into both eyes 2 (two) times daily.    [provider]  DEXILANT 60 MG capsule Take 1 capsule by mouth once daily Patient taking differently: Take 60 mg by mouth daily. 08/01/21   [name], [name], MD  DULoxetine (CYMBALTA) 60 MG capsule Take 2 capsules (120 mg total) by mouth daily. 09/15/21   [name], [name], MD  estradiol (ESTRACE) 2 MG tablet Take 1 tablet by mouth once daily 10/03/21   [name], [name], MD  famotidine (PEPCID) 20 MG tablet Take 20 mg by mouth daily as needed for heartburn or indigestion.    [provider]  GATTEX 5 MG KIT Inject 1.5 mg into the skin daily. 01/03/21   [provider]  hydrALAZINE (APRESOLINE) 100 MG tablet Take 1 tablet (100 mg total) by mouth 3 (three) times daily. 06/30/21   Eugenie Filler, MD  HYDROmorphone (DILAUDID) 4 MG tablet Take 4 mg by mouth 4 (four) times daily as needed for moderate pain or severe pain. 04/18/21   [provider]  levETIRAcetam (KEPPRA) 500 MG tablet Take 1 tablet (500 mg total) by mouth 2 (two) times daily. 08/28/21   Cameron Sprang, MD  lidocaine (LIDODERM) 5 % Place 1 patch onto the skin daily. Remove & Discard patch within 12 hours or as directed by MD Patient not taking: Reported on 09/25/2021 06/30/21   Eugenie Filler, MD  lipase/protease/amylase (CREON) 36000 UNITS CPEP capsule Take 1 capsule (36,000 Units total) by mouth See admin instructions. 36000 units with meals Patient taking differently: Take 36,000 Units by mouth 3 (three) times  daily with meals. 06/30/21   Eugenie Filler, MD  Menthol, Topical Analgesic, (BIOFREEZE EX) Apply 1 application topically as needed (pain).    [provider]  methadone (DOLOPHINE) 5 MG tablet Take 1 tablet (5 mg total) by mouth 5 (five) times daily. 09/29/21   Little Ishikawa, MD  methocarbamol (ROBAXIN) 500 MG tablet Take 1 tablet (500 mg total) by mouth in the morning and at bedtime. 06/13/21   [name], [name], MD  Multiple Vitamins-Minerals (MULTIVITAMIN ADULT PO) Take 1 tablet by mouth daily.    [provider]  NARCAN 4 MG/0.1ML LIQD nasal spray kit Place 1 spray into the nose once as needed (overdose). 10/14/20   [provider]  polyvinyl alcohol (LIQUIFILM TEARS) 1.4 % ophthalmic solution Place 1 drop into both eyes as needed for dry eyes.    [provider]  predniSONE (DELTASONE) 10 MG tablet Take 4 tablets (40 mg total) by mouth daily for 3 days, THEN 3 tablets (30 mg total) daily for 3 days, THEN 2 tablets (20 mg total) daily for 3 days, THEN 1 tablet (10 mg total) daily for 3 days. 09/29/21 10/11/21  Little Ishikawa, MD  predniSONE (DELTASONE) 10 MG tablet Take 4 tablets (40 mg total) by mouth daily for 3 days, THEN 3 tablets (30 mg total) daily for 3 days, THEN 2 tablets (20 mg total) daily for 3 days, THEN 1 tablet (10 mg total) daily for 3 days. 09/29/21 10/11/21  Little Ishikawa, MD  PRESCRIPTION MEDICATION Inject 1 each into the vein daily. Home TPN . [facility] . 1 bag for 12 hours. [phone]    [provider]  promethazine (PHENERGAN) 25 MG tablet TAKE 1 TABLET BY MOUTH EVERY 6 HOURS AS NEEDED FOR NAUSEA FOR VOMITING  Patient taking differently: Take 25 mg by mouth every 6 (six) hours as needed for nausea (or vomiting). 09/22/21   [name], [name], MD  sodium chloride 0.9 % infusion Inject 1,000 mLs into the vein daily. For chronic dehydration 10/16/20   [provider]  sucralfate (CARAFATE) 1 GM/10ML suspension Take 10 mLs (1 g total) by mouth 2 (two) times daily. 06/30/21   Eugenie Filler, MD  Trace Minerals Cu-Mn-Se-Zn (TRALEMENT IV) Inject 1 mL into the vein See admin instructions. Used in TPN bag 4 times weekly    [provider]  vitamin B-12 (CYANOCOBALAMIN) 1000 MCG tablet Take 1,000 mcg by mouth daily.     [provider]    Allergies    Meperidine, Hyoscyamine, Cefepime, Gabapentin, Lyrica [pregabalin], Topamax [topiramate], Zosyn [piperacillin sod-tazobactam so], Fentanyl, and Morphine and related  Review of Systems   Review of Systems  Gastrointestinal:  Positive for abdominal pain, nausea and vomiting.  Genitourinary:  Positive for frequency.  All other systems reviewed and are negative.  Physical Exam Updated Vital Signs BP (!) 176/73 (BP Location: Left Arm)   Pulse 91   Temp (!) 102.6 F (39.2 C) (Oral)   Resp 18   SpO2 98%   Physical Exam Vitals and nursing note reviewed.  Constitutional:      Appearance: She is well-developed.     Comments: Warm to touch  HENT:     Head: Normocephalic and atraumatic.  Eyes:     Conjunctiva/sclera: Conjunctivae normal.     Pupils: Pupils are equal, round, and reactive to light.  Cardiovascular:     Rate and Rhythm: Normal rate and regular rhythm.     Heart sounds: Normal heart sounds.  Pulmonary:     Effort: Pulmonary effort is normal.     Breath sounds: Normal breath sounds.  Abdominal:     General: Bowel sounds are normal.     Palpations: Abdomen is soft.     Tenderness: There is abdominal tenderness.     Comments: Multiple well healed abdominal scars, some tenderness and firmness to left lower abdomen, no peritoneal signs  Musculoskeletal:        General: Normal range of motion.     Cervical back: Normal range of motion.     Comments: PICC line right thigh, appears clean without signs of infection  Skin:    General: Skin is warm and dry.   Neurological:     Mental Status: She is alert and oriented to person, place, and time.    ED Results / Procedures / Treatments   Labs (all labs ordered are listed, but only abnormal results are displayed) Labs Reviewed  CBC WITH DIFFERENTIAL/PLATELET - Abnormal; Notable for the following components:      Result Value   RBC 2.48 (*)    Hemoglobin 7.8 (*)    HCT 23.6 (*)    RDW 16.2 (*)    Platelets 126 (*)    Lymphs Abs 0.3 (*)    All other components within normal limits  COMPREHENSIVE METABOLIC PANEL - Abnormal; Notable for the following components:   BUN 33 (*)    Creatinine, Ser 1.68 (*)    Calcium 8.4 (*)    Total Protein 5.7 (*)    Albumin 2.7 (*)    AST 267 (*)    ALT 221 (*)    Alkaline Phosphatase 156 (*)    GFR, Estimated 35 (*)    All other components within normal limits  RESP PANEL BY RT-PCR (FLU A&B, COVID) ARPGX2  CULTURE, BLOOD (ROUTINE X 2)  CULTURE, BLOOD (ROUTINE X 2)  URINE CULTURE  LIPASE, BLOOD  LACTIC ACID, PLASMA  MAGNESIUM  URINALYSIS, ROUTINE W REFLEX MICROSCOPIC    EKG None  Radiology CT ABDOMEN PELVIS W CONTRAST  Result Date: 10/08/2021 CLINICAL DATA:  Inflammatory bowel disease, Crohn's, recent colitis. Abdominal pain. EXAM: CT ABDOMEN AND PELVIS WITH CONTRAST TECHNIQUE: Multidetector CT imaging of the abdomen and pelvis was performed using the standard protocol following bolus administration of intravenous contrast. CONTRAST:  57m OMNIPAQUE IOHEXOL 350 MG/ML SOLN COMPARISON:  09/25/2021. FINDINGS: Lower chest: Mild dependent atelectasis is noted at the lung bases. The heart is borderline enlarged. Hepatobiliary: No focal liver abnormality is seen. Mild intrahepatic and extrahepatic biliary ductal dilatation with the gallbladder measuring 1 cm, which may be within normal limits for patient's post cholecystectomy status. Pancreas: There is diffuse dilatation the pancreatic duct measuring up to 4 mm, unchanged from the prior exam. No  surrounding fat stranding is seen. Spleen: Normal in size without focal abnormality. Adrenals/Urinary Tract: Adrenal glands are unremarkable. Kidneys are normal, without renal calculi, focal lesion, or hydronephrosis. Bladder is unremarkable. Stomach/Bowel: The stomach is nondistended and otherwise within normal limits. Numerous postsurgical changes are present within the bowel. There is a mildly distended loop of small bowel in the left lower quadrant measuring up to 3.3 cm in diameter and there is mild small bowel wall thickening in the mid abdomen and pelvis. Evaluation of the bowel is somewhat limited due to clumping of the small bowel and lack of oral contrast. Right hemicolectomy changes are noted with a large amount of retained stool in the colon. No free air or pneumatosis. Vascular/Lymphatic: A catheter terminates in the inferior vena cava from a right inguinal approach. There is atherosclerotic calcification of the aorta without evidence of aneurysm. There is a prominent lymph node in the external iliac region on the right measuring 1.2 cm. Nonspecific prominent lymph nodes are also seen in the retroperitoneum in the periaortic space on the left. Reproductive: The uterus is surgically absent. Other: No free fluid in the pelvis. Musculoskeletal: There is diffuse anasarca. No acute osseous abnormality. IMPRESSION: 1. Multifocal surgical changes in the abdomen and pelvis. There are a few distended loops of small bowel in the abdomen measuring up to 3.3 cm in the left lower quadrant, similar in appearance to the prior exam and may represent patulous bowel versus partial or early small bowel obstruction. 2. Status post right hemicolectomy with a large amount of stool in the residual colon and rectum. 3. Anasarca. Electronically Signed   By: LBrett FairyM.D.   On: 10/08/2021 02:16   DG Chest Port 1 View  Result Date: 10/07/2021 CLINICAL DATA:  Sepsis. EXAM: PORTABLE CHEST 1 VIEW COMPARISON:  06/19/2021.  FINDINGS: The heart size and mediastinal contours are within normal limits. Both lungs are clear. No acute osseous abnormality. Surgical clips are present over the right chest and abdomen. IMPRESSION: No acute cardiopulmonary process. Electronically Signed   By: LBrett FairyM.D.   On: 10/07/2021 23:38    Procedures Procedures   CRITICAL CARE Performed by: LLarene Pickett  Total critical care time: 45 minutes  Critical care time was exclusive of separately billable procedures and treating other patients.  Critical care was necessary to treat or prevent imminent or life-threatening deterioration.  Critical care was time spent personally by me on the following activities: development of treatment plan with patient  and/or surrogate as well as nursing, discussions with consultants, evaluation of patient's response to treatment, examination of patient, obtaining history from patient or surrogate, ordering and performing treatments and interventions, ordering and review of laboratory studies, ordering and review of radiographic studies, pulse oximetry and re-evaluation of patient's condition.   Medications Ordered in ED Medications  vancomycin (VANCOREADY) IVPB 1500 mg/300 mL (has no administration in time range)  meropenem (MERREM) 1 g in sodium chloride 0.9 % 100 mL IVPB (has no administration in time range)  acetaminophen (TYLENOL) tablet 1,000 mg (1,000 mg Oral Given 10/07/21 2347)  ondansetron (ZOFRAN) injection 4 mg (4 mg Intravenous Given 10/08/21 0004)  HYDROmorphone (DILAUDID) injection 1 mg (1 mg Intravenous Given 10/08/21 0012)  iohexol (OMNIPAQUE) 350 MG/ML injection 75 mL (75 mLs Intravenous Contrast Given 10/08/21 0139)  HYDROmorphone (DILAUDID) injection 1 mg (1 mg Intravenous Given 10/08/21 0214)    ED Course  I have reviewed the triage vital signs and the nursing notes.  Pertinent labs & imaging results that were available during my care of the patient were reviewed by me  and considered in my medical decision making (see chart for details).    MDM Rules/Calculators/A&P                           60 year old female presenting to the ED with fever.  Recent admission for colitis and infected PICC line 10/31-11/4.  States she had returned to baseline big began feeling poorly again last evening, fever developed this morning.  Febrile to 102.13F on arrival to ED.  She is not tachycardic, not hypotensive, HD stable at present.  She is complaining of increased abdominal pain.  Given her complex history of crohn's with multiple surgeries, will proceed with labs, cultures, CXR, and repeat CT abdomen.  Labs as above-- normal WBC count, normal lactate.  Anemia appears at baseline.  LFT's are a little elvated today  compared with prior.  CT with findings of possibly early/partial SBO.  Patient has not had any vomiting here but reported this at home.  Given her multiple prior surgeries SBO is certainly possible.  Fever has resolved with tylenol.  Negative covid/flu testing.  Blood cultures pending.    We will start broad spectrum abx for suspected possibly PICC infection as this has been a recurrent issue for her.  Last blood cultures grew out and acinetobacter ursingii, completed levaquin via PICC x7 days.  Started vanc/meropenem based on pharmacy recommendations as she has several intolerances including to zosyn.  UA is still pending and may be secondary source.  Discussed with Dr. Marlowe Sax-- will admit for ongoing care.  Final Clinical Impression(s) / ED Diagnoses Final diagnoses:  Fever in adult  Partial small bowel obstruction [facility])    Rx / DC Orders ED Discharge Orders     None        Larene Pickett, PA-C 10/08/21 0406    Palumbo, April, MD 10/08/21 [phone]

## 2021-10-08 ENCOUNTER — Emergency Department (HOSPITAL_COMMUNITY): Payer: Medicare Other

## 2021-10-08 ENCOUNTER — Encounter (HOSPITAL_COMMUNITY): Payer: Self-pay

## 2021-10-08 DIAGNOSIS — Y848 Other medical procedures as the cause of abnormal reaction of the patient, or of later complication, without mention of misadventure at the time of the procedure: Secondary | ICD-10-CM | POA: Diagnosis present

## 2021-10-08 DIAGNOSIS — I129 Hypertensive chronic kidney disease with stage 1 through stage 4 chronic kidney disease, or unspecified chronic kidney disease: Secondary | ICD-10-CM | POA: Diagnosis present

## 2021-10-08 DIAGNOSIS — D509 Iron deficiency anemia, unspecified: Secondary | ICD-10-CM | POA: Diagnosis present

## 2021-10-08 DIAGNOSIS — K56609 Unspecified intestinal obstruction, unspecified as to partial versus complete obstruction: Secondary | ICD-10-CM | POA: Diagnosis present

## 2021-10-08 DIAGNOSIS — G40909 Epilepsy, unspecified, not intractable, without status epilepticus: Secondary | ICD-10-CM | POA: Diagnosis present

## 2021-10-08 DIAGNOSIS — G894 Chronic pain syndrome: Secondary | ICD-10-CM | POA: Diagnosis present

## 2021-10-08 DIAGNOSIS — N1832 Chronic kidney disease, stage 3b: Secondary | ICD-10-CM | POA: Diagnosis present

## 2021-10-08 DIAGNOSIS — E440 Moderate protein-calorie malnutrition: Secondary | ICD-10-CM | POA: Diagnosis present

## 2021-10-08 DIAGNOSIS — A498 Other bacterial infections of unspecified site: Secondary | ICD-10-CM | POA: Diagnosis not present

## 2021-10-08 DIAGNOSIS — T80211D Bloodstream infection due to central venous catheter, subsequent encounter: Secondary | ICD-10-CM | POA: Diagnosis not present

## 2021-10-08 DIAGNOSIS — K5080 Crohn's disease of both small and large intestine without complications: Secondary | ICD-10-CM | POA: Diagnosis present

## 2021-10-08 DIAGNOSIS — E876 Hypokalemia: Secondary | ICD-10-CM | POA: Diagnosis not present

## 2021-10-08 DIAGNOSIS — Z881 Allergy status to other antibiotic agents status: Secondary | ICD-10-CM | POA: Diagnosis not present

## 2021-10-08 DIAGNOSIS — B961 Klebsiella pneumoniae [K. pneumoniae] as the cause of diseases classified elsewhere: Secondary | ICD-10-CM | POA: Diagnosis not present

## 2021-10-08 DIAGNOSIS — M810 Age-related osteoporosis without current pathological fracture: Secondary | ICD-10-CM | POA: Diagnosis present

## 2021-10-08 DIAGNOSIS — R7881 Bacteremia: Secondary | ICD-10-CM | POA: Diagnosis not present

## 2021-10-08 DIAGNOSIS — K219 Gastro-esophageal reflux disease without esophagitis: Secondary | ICD-10-CM | POA: Diagnosis present

## 2021-10-08 DIAGNOSIS — Z20822 Contact with and (suspected) exposure to covid-19: Secondary | ICD-10-CM | POA: Diagnosis present

## 2021-10-08 DIAGNOSIS — K912 Postsurgical malabsorption, not elsewhere classified: Secondary | ICD-10-CM | POA: Diagnosis present

## 2021-10-08 DIAGNOSIS — K8689 Other specified diseases of pancreas: Secondary | ICD-10-CM | POA: Diagnosis present

## 2021-10-08 DIAGNOSIS — Z9049 Acquired absence of other specified parts of digestive tract: Secondary | ICD-10-CM | POA: Diagnosis not present

## 2021-10-08 DIAGNOSIS — A4159 Other Gram-negative sepsis: Secondary | ICD-10-CM | POA: Diagnosis present

## 2021-10-08 DIAGNOSIS — Z681 Body mass index (BMI) 19 or less, adult: Secondary | ICD-10-CM | POA: Diagnosis not present

## 2021-10-08 DIAGNOSIS — T80211A Bloodstream infection due to central venous catheter, initial encounter: Secondary | ICD-10-CM | POA: Diagnosis present

## 2021-10-08 DIAGNOSIS — F32A Depression, unspecified: Secondary | ICD-10-CM | POA: Diagnosis present

## 2021-10-08 DIAGNOSIS — F419 Anxiety disorder, unspecified: Secondary | ICD-10-CM | POA: Diagnosis present

## 2021-10-08 DIAGNOSIS — Z885 Allergy status to narcotic agent status: Secondary | ICD-10-CM | POA: Diagnosis not present

## 2021-10-08 DIAGNOSIS — Z888 Allergy status to other drugs, medicaments and biological substances status: Secondary | ICD-10-CM | POA: Diagnosis not present

## 2021-10-08 DIAGNOSIS — Z789 Other specified health status: Secondary | ICD-10-CM | POA: Diagnosis not present

## 2021-10-08 DIAGNOSIS — K56600 Partial intestinal obstruction, unspecified as to cause: Secondary | ICD-10-CM | POA: Diagnosis present

## 2021-10-08 DIAGNOSIS — D696 Thrombocytopenia, unspecified: Secondary | ICD-10-CM | POA: Diagnosis present

## 2021-10-08 LAB — BLOOD CULTURE ID PANEL (REFLEXED) - BCID2

## 2021-10-08 LAB — COMPREHENSIVE METABOLIC PANEL
ALT: 221 U/L — ABNORMAL HIGH (ref 0–44)
AST: 267 U/L — ABNORMAL HIGH (ref 15–41)
Albumin: 2.7 g/dL — ABNORMAL LOW (ref 3.5–5.0)
Alkaline Phosphatase: 156 U/L — ABNORMAL HIGH (ref 38–126)
Anion gap: 6 (ref 5–15)
BUN: 33 mg/dL — ABNORMAL HIGH (ref 6–20)
CO2: 28 mmol/L (ref 22–32)
Calcium: 8.4 mg/dL — ABNORMAL LOW (ref 8.9–10.3)
Chloride: 103 mmol/L (ref 98–111)
Creatinine, Ser: 1.68 mg/dL — ABNORMAL HIGH (ref 0.44–1.00)
GFR, Estimated: 35 mL/min — ABNORMAL LOW (ref >60)
Glucose, Bld: 94 mg/dL (ref 70–99)
Potassium: 3.7 mmol/L (ref 3.5–5.1)
Sodium: 137 mmol/L (ref 135–145)
Total Bilirubin: 0.7 mg/dL (ref 0.3–1.2)
Total Protein: 5.7 g/dL — ABNORMAL LOW (ref 6.5–8.1)

## 2021-10-08 LAB — CBC WITH DIFFERENTIAL/PLATELET
Abs Immature Granulocytes: 0.03 10*3/uL (ref 0.00–0.07)
Basophils Absolute: 0 10*3/uL (ref 0.0–0.1)
Basophils Relative: 0 %
Eosinophils Absolute: 0 10*3/uL (ref 0.0–0.5)
Eosinophils Relative: 0 %
HCT: 23.6 % — ABNORMAL LOW (ref 36.0–46.0)
Hemoglobin: 7.8 g/dL — ABNORMAL LOW (ref 12.0–15.0)
Immature Granulocytes: 0 %
Lymphocytes Relative: 5 %
Lymphs Abs: 0.3 10*3/uL — ABNORMAL LOW (ref 0.7–4.0)
MCH: 31.5 pg (ref 26.0–34.0)
MCHC: 33.1 g/dL (ref 30.0–36.0)
MCV: 95.2 fL (ref 80.0–100.0)
Monocytes Absolute: 0.2 10*3/uL (ref 0.1–1.0)
Monocytes Relative: 3 %
Neutro Abs: 6.7 10*3/uL (ref 1.7–7.7)
Neutrophils Relative %: 92 %
Platelets: 126 10*3/uL — ABNORMAL LOW (ref 150–400)
RBC: 2.48 MIL/uL — ABNORMAL LOW (ref 3.87–5.11)
RDW: 16.2 % — ABNORMAL HIGH (ref 11.5–15.5)
WBC: 7.3 10*3/uL (ref 4.0–10.5)
nRBC: 0 % (ref 0.0–0.2)

## 2021-10-08 LAB — LACTIC ACID, PLASMA: Lactic Acid, Venous: 1.1 mmol/L (ref 0.5–1.9)

## 2021-10-08 LAB — RESP PANEL BY RT-PCR (FLU A&B, COVID) ARPGX2
Influenza A by PCR: NEGATIVE
Influenza B by PCR: NEGATIVE
SARS Coronavirus 2 by RT PCR: NEGATIVE

## 2021-10-08 LAB — MAGNESIUM: Magnesium: 1.9 mg/dL (ref 1.7–2.4)

## 2021-10-08 LAB — LIPASE, BLOOD: Lipase: 22 U/L (ref 11–51)

## 2021-10-08 MED ORDER — SODIUM CHLORIDE 0.9 % IV SOLN: INTRAVENOUS | Status: DC

## 2021-10-08 MED ORDER — METHOCARBAMOL 500 MG PO TABS
500.0000 mg | ORAL_TABLET | Freq: Two times a day (BID) | ORAL | Status: DC
  Administered 2021-10-08 – 2021-10-16 (×16): 500 mg via ORAL
  Filled 2021-10-08 (×16): qty 1

## 2021-10-08 MED ORDER — CARVEDILOL 12.5 MG PO TABS
12.5000 mg | ORAL_TABLET | Freq: Two times a day (BID) | ORAL | Status: DC
  Administered 2021-10-08 – 2021-10-16 (×17): 12.5 mg via ORAL
  Filled 2021-10-08 (×18): qty 1

## 2021-10-08 MED ORDER — CYCLOSPORINE 0.05 % OP EMUL
1.0000 [drp] | Freq: Two times a day (BID) | OPHTHALMIC | Status: DC
  Administered 2021-10-08 – 2021-10-16 (×16): 1 [drp] via OPHTHALMIC
  Filled 2021-10-08 (×18): qty 30

## 2021-10-08 MED ORDER — CENTRUM PO CHEW
CHEWABLE_TABLET | Freq: Every day | ORAL | Status: DC
  Administered 2021-10-10 – 2021-10-14 (×5): 1 via ORAL
  Filled 2021-10-08 (×8): qty 1

## 2021-10-08 MED ORDER — SUCRALFATE 1 GM/10ML PO SUSP
1.0000 g | Freq: Two times a day (BID) | ORAL | Status: DC
  Administered 2021-10-08 – 2021-10-16 (×17): 1 g via ORAL
  Filled 2021-10-08 (×17): qty 10

## 2021-10-08 MED ORDER — IOHEXOL 350 MG/ML SOLN
75.0000 mL | Freq: Once | INTRAVENOUS | Status: AC | PRN
  Administered 2021-10-08: 75 mL via INTRAVENOUS

## 2021-10-08 MED ORDER — TEDUGLUTIDE (RDNA) 5 MG ~~LOC~~ KIT
1.5000 mg | PACK | Freq: Every day | SUBCUTANEOUS | Status: DC
Start: 2021-10-08 — End: 2021-10-16
  Administered 2021-10-08 – 2021-10-16 (×9): 1.5 mg via SUBCUTANEOUS
  Filled 2021-10-08 (×2): qty 1

## 2021-10-08 MED ORDER — SODIUM CHLORIDE 0.9 % IV SOLN
1.0000 g | Freq: Once | INTRAVENOUS | Status: AC
  Administered 2021-10-08: 1 g via INTRAVENOUS
  Filled 2021-10-08: qty 1

## 2021-10-08 MED ORDER — VANCOMYCIN HCL 1500 MG/300ML IV SOLN
1500.0000 mg | Freq: Once | INTRAVENOUS | Status: AC
  Administered 2021-10-08: 1500 mg via INTRAVENOUS
  Filled 2021-10-08: qty 300

## 2021-10-08 MED ORDER — ENOXAPARIN SODIUM 40 MG/0.4ML IJ SOSY
40.0000 mg | PREFILLED_SYRINGE | INTRAMUSCULAR | Status: DC
  Administered 2021-10-08 – 2021-10-13 (×6): 40 mg via SUBCUTANEOUS
  Filled 2021-10-08 (×6): qty 0.4

## 2021-10-08 MED ORDER — HYDROMORPHONE HCL 1 MG/ML IJ SOLN
1.0000 mg | INTRAMUSCULAR | Status: DC | PRN
  Administered 2021-10-08 – 2021-10-10 (×12): 1 mg via INTRAVENOUS
  Filled 2021-10-08 (×12): qty 1

## 2021-10-08 MED ORDER — CHLORHEXIDINE GLUCONATE CLOTH 2 % EX PADS
6.0000 | MEDICATED_PAD | Freq: Every day | CUTANEOUS | Status: DC
  Administered 2021-10-08 – 2021-10-16 (×9): 6 via TOPICAL

## 2021-10-08 MED ORDER — POLYVINYL ALCOHOL 1.4 % OP SOLN
1.0000 [drp] | OPHTHALMIC | Status: DC | PRN
  Filled 2021-10-08: qty 15

## 2021-10-08 MED ORDER — AMLODIPINE BESYLATE 10 MG PO TABS
10.0000 mg | ORAL_TABLET | Freq: Every day | ORAL | Status: DC
  Administered 2021-10-09 – 2021-10-16 (×8): 10 mg via ORAL
  Filled 2021-10-08 (×8): qty 1

## 2021-10-08 MED ORDER — VANCOMYCIN HCL 750 MG/150ML IV SOLN
750.0000 mg | INTRAVENOUS | Status: DC
  Administered 2021-10-09: 750 mg via INTRAVENOUS
  Filled 2021-10-08: qty 150

## 2021-10-08 MED ORDER — HYDRALAZINE HCL 50 MG PO TABS
100.0000 mg | ORAL_TABLET | Freq: Three times a day (TID) | ORAL | Status: DC
  Administered 2021-10-09 – 2021-10-16 (×22): 100 mg via ORAL
  Filled 2021-10-08 (×22): qty 2

## 2021-10-08 MED ORDER — PROCHLORPERAZINE EDISYLATE 10 MG/2ML IJ SOLN
10.0000 mg | Freq: Four times a day (QID) | INTRAMUSCULAR | Status: DC | PRN
  Administered 2021-10-08 – 2021-10-16 (×3): 10 mg via INTRAVENOUS
  Filled 2021-10-08 (×3): qty 2

## 2021-10-08 MED ORDER — HYDROMORPHONE HCL 1 MG/ML IJ SOLN
1.0000 mg | Freq: Once | INTRAMUSCULAR | Status: AC
  Administered 2021-10-08: 1 mg via INTRAVENOUS
  Filled 2021-10-08: qty 1

## 2021-10-08 MED ORDER — CALCIUM 500 MG PO TABS: 500.0000 mg | ORAL_TABLET | Freq: Every day | ORAL | Status: DC

## 2021-10-08 MED ORDER — SODIUM CHLORIDE 0.9 % IV SOLN
1.0000 g | Freq: Two times a day (BID) | INTRAVENOUS | Status: DC
  Administered 2021-10-08: 1 g via INTRAVENOUS
  Filled 2021-10-08 (×2): qty 1

## 2021-10-08 MED ORDER — CALCIUM CARBONATE 1250 (500 CA) MG PO TABS
1.0000 | ORAL_TABLET | Freq: Every day | ORAL | Status: DC
  Administered 2021-10-09 – 2021-10-15 (×7): 500 mg via ORAL
  Filled 2021-10-08 (×8): qty 1

## 2021-10-08 MED ORDER — LEVETIRACETAM 500 MG PO TABS
500.0000 mg | ORAL_TABLET | Freq: Two times a day (BID) | ORAL | Status: DC
  Administered 2021-10-08 – 2021-10-16 (×17): 500 mg via ORAL
  Filled 2021-10-08 (×17): qty 1

## 2021-10-08 MED ORDER — PROMETHAZINE HCL 25 MG PO TABS
25.0000 mg | ORAL_TABLET | Freq: Four times a day (QID) | ORAL | Status: DC | PRN
  Administered 2021-10-11 – 2021-10-16 (×16): 25 mg via ORAL
  Filled 2021-10-08 (×16): qty 1

## 2021-10-08 MED ORDER — VITAMIN D 25 MCG (1000 UNIT) PO TABS
1000.0000 [IU] | ORAL_TABLET | Freq: Every day | ORAL | Status: DC
  Administered 2021-10-08 – 2021-10-15 (×8): 1000 [IU] via ORAL
  Filled 2021-10-08 (×9): qty 1

## 2021-10-08 MED ORDER — FAMOTIDINE 20 MG PO TABS: 20.0000 mg | ORAL_TABLET | Freq: Every day | ORAL | Status: DC | PRN

## 2021-10-08 MED ORDER — VITAMIN B-12 1000 MCG PO TABS
1000.0000 ug | ORAL_TABLET | Freq: Every day | ORAL | Status: DC
Start: 2021-10-08 — End: 2021-10-16
  Administered 2021-10-08 – 2021-10-15 (×8): 1000 ug via ORAL
  Filled 2021-10-08 (×9): qty 1

## 2021-10-08 MED ORDER — METHADONE HCL 10 MG PO TABS
5.0000 mg | ORAL_TABLET | Freq: Every day | ORAL | Status: DC
  Administered 2021-10-08 – 2021-10-16 (×40): 5 mg via ORAL
  Filled 2021-10-08 (×41): qty 1

## 2021-10-08 MED ORDER — ESTRADIOL 1 MG PO TABS
2.0000 mg | ORAL_TABLET | Freq: Every day | ORAL | Status: DC
  Administered 2021-10-09 – 2021-10-16 (×8): 2 mg via ORAL
  Filled 2021-10-08 (×8): qty 2

## 2021-10-08 MED ORDER — PANCRELIPASE (LIP-PROT-AMYL) 12000-38000 UNITS PO CPEP
36000.0000 [IU] | ORAL_CAPSULE | Freq: Three times a day (TID) | ORAL | Status: DC
Start: 2021-10-08 — End: 2021-10-16
  Administered 2021-10-08 – 2021-10-16 (×23): 36000 [IU] via ORAL
  Filled 2021-10-08 (×25): qty 3

## 2021-10-08 MED ORDER — PANTOPRAZOLE SODIUM 40 MG PO TBEC
40.0000 mg | DELAYED_RELEASE_TABLET | Freq: Every day | ORAL | Status: DC
  Administered 2021-10-08 – 2021-10-16 (×9): 40 mg via ORAL
  Filled 2021-10-08 (×9): qty 1

## 2021-10-08 NOTE — Progress Notes (Signed)
A consult was received from an ED physician for Vancomycin & Zosyn per pharmacy dosing.  [name] profile has been reviewed for ht/wt/allergies/indication/available labs.   Noted previous intolerance to Zosyn.  Will chance to Meropenem- ok with prescriber.  A one time order has been placed for Meropenem 1gm & Vancomycin 1525m IV.  Further antibiotics/pharmacy consults should be ordered by admitting physician if indicated.                       Thank you, MNetta CedarsPharmD 10/08/2021  4:00 AM

## 2021-10-08 NOTE — ED Notes (Signed)
Gave bedside report to [place], [place]

## 2021-10-08 NOTE — H&P (Signed)
History and Physical    Caitlin Peters CWU:[id number] DOB: Apr 03, 1961 DOA: 10/07/2021  PCP: [name], [name], MD  Patient coming from: Home  Chief Complaint: fever  HPI: Caitlin Peters is a 60 y.o. female with medical history significant of Crohn's disease, short-bowel syndrome, chronic PICC for TPN, HTN, GERD, anxiety/depression, seizure d/o. Presenting with fever. She had a recent admission d/t colitis and acetobacter bacteremia. She was discharged on 11/4 w/ 5 days of IV lvw per PICC. She completed this medication on 11/10. She was in her normal state of health until yesterday. She was able to go for a walk, but then started having crampy abdominal pain and throbbing back pain. She thought it was similar to when she had colitis pain. She noted that she had a fever of 102. She became concerned, took APAP, and called for EMS. She denies any other aggravating or alleviating factors.   ED Course: CT showed possible early SBO. She was noted to have a fever of 102.6. She was noted to have elevated LFTs. She was started on vanc and merrem. TRH was called for admission.   Review of Systems:  Denies CP, dyspnea, N/V/D, lightheadedness, dizziness, sick contacts, Review of systems is otherwise negative for all not mentioned in HPI.   PMHx Past Medical History:  Diagnosis Date   Acute pancreatitis 04/13/2020   Anasarca 10/2019   AVN (avascular necrosis of bone) ([name])    Cataract    Choledocholithiasis (sludge) s/p ERCP 10/2019 10/21/2020   Chronic pain syndrome    CKD (chronic kidney disease), stage III ([place])    Crohn disease ([place])    Crohn's disease of small & large intestine with SGS 1984   Caitlin Peters is a 60 year old female with a history of Crohn's disease diagnosed in 73 (age 76), history of long-term steroid use with osteoporosis, S/P multiple bowel resections ([id number]) complicated by chronic back and abdominal pain, steatorrhea and short bowel syndrome. The patient  has been left with ~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 attached to proximal transverse colon through rectum. She has been   Depression    Diverticulosis    GERD (gastroesophageal reflux disease)    HTN (hypertension)    IDA (iron deficiency anemia)    Malnutrition ([employer])    Mass in chest    Osteoporosis    Osteoporosis 12/24/2014   Pancreatitis    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; 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; jejunal to transverse colon anastomosis Treated at [place] lengthening to 165cm Dr Alene Mires, [facility]   Vitamin B12 deficiency     PSHx Past Surgical History:  Procedure Laterality Date   ABDOMINAL ADHESION SURGERY  01/22/2018   APPENDECTOMY  1989   BILIARY DILATION  11/26/2019   Procedure: BILIARY DILATION;  Surgeon: Jackquline Denmark, MD;  Location: [facility];  Service: Endoscopy;;   BILIARY DILATION  03/08/2020   Procedure: BILIARY DILATION;  Surgeon: Irving Copas., MD;  Location: [place];  Service: Gastroenterology;;   BIOPSY  03/08/2020   Procedure: BIOPSY;  Surgeon: Irving Copas., MD;  Location: [place];  Service: Gastroenterology;;   CHEST WALL RESECTION     right thoracotomy,resection of chest mass with anterior rib and reconstruction using prosthetic mesh and video arthroscopy   CHOLECYSTECTOMY  01/22/2018   COLONOSCOPY  2019   ENTEROSTOMY CLOSURE  04/1999   ERCP  N/A 11/26/2019   Procedure: ENDOSCOPIC RETROGRADE CHOLANGIOPANCREATOGRAPHY (ERCP);  Surgeon: Jackquline Denmark, MD;  Location: [facility];  Service: Endoscopy;  Laterality: N/A;   ERCP N/A 03/08/2020   Procedure: ENDOSCOPIC RETROGRADE CHOLANGIOPANCREATOGRAPHY (ERCP);  Surgeon: Irving Copas., MD;  Location: [place];  Service: Gastroenterology;  Laterality: N/A;   ESOPHAGOGASTRODUODENOSCOPY N/A 03/08/2020   Procedure: ESOPHAGOGASTRODUODENOSCOPY (EGD);  Surgeon:  Irving Copas., MD;  Location: [place];  Service: Gastroenterology;  Laterality: N/A;   EUS N/A 03/08/2020   Procedure: UPPER ENDOSCOPIC ULTRASOUND (EUS) LINEAR;  Surgeon: Irving Copas., MD;  Location: [place];  Service: Gastroenterology;  Laterality: N/A;   ILEOCECETOMY  03/1999   ileocolon resection with abdominal stoma   ILEOSTOMY CLOSURE  2001   IR FLUORO GUIDE CV LINE LEFT  01/07/2020   IR FLUORO GUIDE CV LINE LEFT  03/09/2020   IR FLUORO GUIDE CV LINE LEFT  05/09/2020   IR FLUORO GUIDE CV LINE LEFT  07/20/2020   IR FLUORO GUIDE CV LINE RIGHT  08/05/2020   IR FLUORO GUIDE CV LINE RIGHT  04/03/2021   IR PTA VENOUS EXCEPT DIALYSIS CIRCUIT  01/07/2020   IR REMOVAL TUN CV CATH W/O FL  08/05/2020   IR REMOVAL TUN CV CATH W/O FL  07/11/2021   IR US GUIDE VASC ACCESS LEFT     x 2 06/17/19 and 09/14/2019   IR US GUIDE VASC ACCESS RIGHT  08/05/2020   KNEE SURGERY     right knee    LAPAROSCOPIC SMALL BOWEL RESECTION  2009   2000-2009.  SB resections for Crohns Disease - now with Short gut   OMENTECTOMY  01/22/2018   PARTIAL HYSTERECTOMY  1984   with LSO   REMOVAL OF STONES  11/26/2019   Procedure: REMOVAL OF STONES;  Surgeon: Jackquline Denmark, MD;  Location: [facility];  Service: Endoscopy;;   REMOVAL OF STONES  03/08/2020   Procedure: REMOVAL OF STONES;  Surgeon: Irving Copas., MD;  Location: [facility];  Service: Gastroenterology;;   SALPINGOOPHORECTOMY Left 1984   SALPINGOOPHORECTOMY Right 1990   SERIAL TRANSVERSE ENTEROPLASTY (STEP) - SMALL BOWEL LENGTHENING  01/22/2018   Dr Alene Mires, [facility] - SB length from 120 to 165cm    SMALL INTESTINE SURGERY  2002   SMALL INTESTINE SURGERY  2003   SPHINCTEROTOMY  11/26/2019   Procedure: SPHINCTEROTOMY;  Surgeon: Jackquline Denmark, MD;  Location: [facility];  Service: Endoscopy;;   TOTAL ABDOMINAL HYSTERECTOMY  1990   with RSO   UPPER GASTROINTESTINAL ENDOSCOPY      SocHx  reports that she has quit  smoking. She has never used smokeless tobacco. She reports that she does not currently use alcohol. She reports that she does not use drugs.  Allergies  Allergen Reactions   Meperidine Hives    Other reaction(s): GI Upset Due to Chrones    Hyoscyamine Hives and Swelling    Legs swelling Disorientation   Cefepime Other (See Comments)    Neurotoxicity occurring in setting of AKI. Ceftriaxone tolerated during same admit   Gabapentin Other (See Comments)    unknown   Lyrica [Pregabalin] Other (See Comments)    Has chronic dehydration and made it worse   Topamax [Topiramate] Other (See Comments)    Has chronic dehydration and made it worse   Zosyn [Piperacillin Sod-Tazobactam So] Other (See Comments)    Patient reports it makes her vomit, her neck stiff, and her "heart feel funny" Affected kidneys   Fentanyl Rash  Pt is allergic to fentanyl patch related to the glue (gives her a rash) Pt states she is NOT allergic to fentanyl IV medicine   Morphine And Related Rash    FamHx Family History  Problem Relation Age of Onset   Seizures Mother    Glaucoma Mother    CAD Father    Heart disease Father    Hypertension Father    Breast cancer Sister    Multiple sclerosis Sister    Diabetes Sister    Lupus Sister    Colon cancer Other    Crohn's disease Other     Prior to Admission medications   Medication Sig Start Date End Date Taking? Authorizing Provider  amLODipine (NORVASC) 10 MG tablet Take 1 tablet by mouth once daily Patient taking differently: Take 10 mg by mouth daily. 08/22/21  Yes [name], [name], MD  buPROPion ER [facility]) 100 MG 12 hr tablet Take 2 tablets by mouth once daily Patient taking differently: Take 200 mg by mouth daily. 08/30/21  Yes [name], [name], MD  Calcium 500 MG tablet Take 500 mg by mouth daily.   Yes [provider]  carvedilol (COREG) 12.5 MG tablet Take 1 tablet (12.5 mg total) by mouth 2 (two) times daily  with a meal. 09/29/21  Yes Little Ishikawa, MD  cholecalciferol (VITAMIN D3) 25 MCG (1000 UT) tablet Take 1,000 Units by mouth daily.    Yes [provider]  cycloSPORINE (RESTASIS) 0.05 % ophthalmic emulsion Place 1 drop into both eyes 2 (two) times daily.   Yes [provider]  DEXILANT 60 MG capsule Take 1 capsule by mouth once daily Patient taking differently: Take 60 mg by mouth daily. 08/01/21  Yes [name], [name], MD  diphenoxylate-atropine (LOMOTIL) 2.5-0.025 MG tablet Take 1 tablet by mouth 2 (two) times daily as needed for diarrhea or loose stools. 09/04/21  Yes [provider]  DULoxetine (CYMBALTA) 60 MG capsule Take 2 capsules (120 mg total) by mouth daily. 09/15/21  Yes [name], [name], MD  estradiol (ESTRACE) 2 MG tablet Take 1 tablet by mouth once daily Patient taking differently: Take 2 mg by mouth daily. 10/03/21  Yes [name], [name], MD  famotidine (PEPCID) 20 MG tablet Take 20 mg by mouth daily as needed for heartburn or indigestion.   Yes [provider]  GATTEX 5 MG KIT Inject 1.5 mg into the skin daily. 01/03/21  Yes [provider]  hydrALAZINE (APRESOLINE) 100 MG tablet Take 1 tablet (100 mg total) by mouth 3 (three) times daily. 06/30/21  Yes Eugenie Filler, MD  HYDROmorphone (DILAUDID) 4 MG tablet Take 4 mg by mouth 4 (four) times daily as needed for moderate pain or severe pain. 04/18/21  Yes [provider]  levETIRAcetam (KEPPRA) 500 MG tablet Take 1 tablet (500 mg total) by mouth 2 (two) times daily. 08/28/21  Yes Cameron Sprang, MD  lipase/protease/amylase (CREON) 36000 UNITS CPEP capsule Take 1 capsule (36,000 Units total) by mouth See admin instructions. 36000 units with meals Patient taking differently: Take 36,000 Units by mouth 3 (three) times daily with meals. 06/30/21  Yes Eugenie Filler, MD  Menthol, Topical Analgesic, (BIOFREEZE EX) Apply 1 application topically as needed  (pain).   Yes [provider]  methadone (DOLOPHINE) 5 MG tablet Take 1 tablet (5 mg total) by mouth 5 (five) times daily. 09/29/21  Yes Little Ishikawa, MD  methocarbamol (ROBAXIN) 500 MG tablet  Take 1 tablet (500 mg total) by mouth in the morning and at bedtime. 06/13/21  Yes [name], [name], MD  Multiple Vitamins-Minerals (MULTIVITAMIN ADULT PO) Take 1 tablet by mouth daily.   Yes [provider]  polyvinyl alcohol (LIQUIFILM TEARS) 1.4 % ophthalmic solution Place 1 drop into both eyes as needed for dry eyes.   Yes [provider]  predniSONE (DELTASONE) 10 MG tablet Take 4 tablets (40 mg total) by mouth daily for 3 days, THEN 3 tablets (30 mg total) daily for 3 days, THEN 2 tablets (20 mg total) daily for 3 days, THEN 1 tablet (10 mg total) daily for 3 days. 09/29/21 10/11/21 Yes Little Ishikawa, MD  PRESCRIPTION MEDICATION Inject 1 each into the vein daily. Home TPN . [facility] . 1 bag for 12 hours. [phone]   Yes [provider]  promethazine (PHENERGAN) 25 MG tablet TAKE 1 TABLET BY MOUTH EVERY 6 HOURS AS NEEDED FOR NAUSEA FOR VOMITING Patient taking differently: Take 25 mg by mouth every 6 (six) hours as needed for nausea (or vomiting). 09/22/21  Yes [name], [name], MD  sodium chloride 0.9 % infusion Inject 1,000 mLs into the vein daily. For chronic dehydration 10/16/20  Yes [provider]  sucralfate (CARAFATE) 1 GM/10ML suspension Take 10 mLs (1 g total) by mouth 2 (two) times daily. 06/30/21  Yes Eugenie Filler, MD  Trace Minerals Cu-Mn-Se-Zn (TRALEMENT IV) Inject 1 mL into the vein See admin instructions. Used in TPN bag 4 times weekly   Yes [provider]  vitamin B-12 (CYANOCOBALAMIN) 1000 MCG tablet Take 1,000 mcg by mouth daily.    Yes [provider]  levofloxacin (LEVAQUIN) 500 MG/100ML SOLN Inject 250 mg into the vein daily. For 5 days Patient not  taking: Reported on 10/08/2021 09/29/21   [provider]  lidocaine (LIDODERM) 5 % Place 1 patch onto the skin daily. Remove & Discard patch within 12 hours or as directed by MD Patient not taking: No sig reported 06/30/21   Eugenie Filler, MD  [facility] 4 MG/0.1ML LIQD nasal spray kit Place 1 spray into the nose once as needed (overdose). 10/14/20   [provider]  predniSONE (DELTASONE) 10 MG tablet Take 4 tablets (40 mg total) by mouth daily for 3 days, THEN 3 tablets (30 mg total) daily for 3 days, THEN 2 tablets (20 mg total) daily for 3 days, THEN 1 tablet (10 mg total) daily for 3 days. 09/29/21 10/11/21  Little Ishikawa, MD    Physical Exam: Vitals:   10/08/21 0530 10/08/21 0610 10/08/21 0634 10/08/21 0636  BP: (!) 149/71   (!) 142/64  Pulse: 84  82 73  Resp: 12     Temp:  97.9 F (36.6 C)  99.3 F (37.4 C)  TempSrc:  Oral  Oral  SpO2: 92%  98% 97%  Weight:    67.8 kg  Height:    5' 8"  (1.727 m)    General: 60 y.o. female resting in bed in NAD Eyes: PERRL, normal sclera ENMT: Nares patent w/o discharge, orophaynx clear, dentition normal, ears w/o discharge/lesions/ulcers Neck: Supple, trachea midline Cardiovascular: RRR, +S1, S2, no m/g/r, equal pulses throughout Respiratory: CTABL, no w/r/r, normal WOB GI: BS hypoactive, NDNT, no masses noted, no organomegaly noted MSK: No e/c/c Neuro: A&O x 3, no focal deficits Psyc: Appropriate interaction and affect, calm/cooperative  Labs on Admission: I have personally reviewed following labs and  imaging studies  CBC: Recent Labs  Lab 10/08/21 0002  WBC 7.3  NEUTROABS 6.7  HGB 7.8*  HCT 23.6*  MCV 95.2  PLT 099*   Basic Metabolic Panel: Recent Labs  Lab 10/08/21 0002  NA 137  K 3.7  CL 103  CO2 28  GLUCOSE 94  BUN 33*  CREATININE 1.68*  CALCIUM 8.4*  MG 1.9   GFR: Estimated Creatinine Clearance: 35.9 mL/min (A) (by C-G formula based on SCr of 1.68 mg/dL (H)). Liver Function  Tests: Recent Labs  Lab 10/08/21 0002  AST 267*  ALT 221*  ALKPHOS 156*  BILITOT 0.7  PROT 5.7*  ALBUMIN 2.7*   Recent Labs  Lab 10/08/21 0002  LIPASE 22   No results for input(s): AMMONIA in the last 168 hours. Coagulation Profile: No results for input(s): INR, PROTIME in the last 168 hours. Cardiac Enzymes: No results for input(s): CKTOTAL, CKMB, CKMBINDEX, TROPONINI in the last 168 hours. BNP (last 3 results) No results for input(s): PROBNP in the last 8760 hours. HbA1C: No results for input(s): HGBA1C in the last 72 hours. CBG: No results for input(s): GLUCAP in the last 168 hours. Lipid Profile: No results for input(s): CHOL, HDL, LDLCALC, TRIG, CHOLHDL, LDLDIRECT in the last 72 hours. Thyroid Function Tests: No results for input(s): TSH, T4TOTAL, FREET4, T3FREE, THYROIDAB in the last 72 hours. Anemia Panel: No results for input(s): VITAMINB12, FOLATE, FERRITIN, TIBC, IRON, RETICCTPCT in the last 72 hours. Urine analysis:    Component Value Date/Time   COLORURINE STRAW (A) 09/27/2021 1030   APPEARANCEUR CLEAR 09/27/2021 1030   LABSPEC 1.006 09/27/2021 1030   PHURINE 8.0 09/27/2021 1030   GLUCOSEU NEGATIVE 09/27/2021 1030   HGBUR NEGATIVE 09/27/2021 1030   BILIRUBINUR NEGATIVE 09/27/2021 1030   KETONESUR NEGATIVE 09/27/2021 1030   PROTEINUR 30 (A) 09/27/2021 1030   NITRITE NEGATIVE 09/27/2021 1030   LEUKOCYTESUR NEGATIVE 09/27/2021 1030    Radiological Exams on Admission: CT ABDOMEN PELVIS W CONTRAST  Result Date: 10/08/2021 CLINICAL DATA:  Inflammatory bowel disease, Crohn's, recent colitis. Abdominal pain. EXAM: CT ABDOMEN AND PELVIS WITH CONTRAST TECHNIQUE: Multidetector CT imaging of the abdomen and pelvis was performed using the standard protocol following bolus administration of intravenous contrast. CONTRAST:  34m OMNIPAQUE IOHEXOL 350 MG/ML SOLN COMPARISON:  09/25/2021. FINDINGS: Lower chest: Mild dependent atelectasis is noted at the lung bases. The  heart is borderline enlarged. Hepatobiliary: No focal liver abnormality is seen. Mild intrahepatic and extrahepatic biliary ductal dilatation with the gallbladder measuring 1 cm, which may be within normal limits for patient's post cholecystectomy status. Pancreas: There is diffuse dilatation the pancreatic duct measuring up to 4 mm, unchanged from the prior exam. No surrounding fat stranding is seen. Spleen: Normal in size without focal abnormality. Adrenals/Urinary Tract: Adrenal glands are unremarkable. Kidneys are normal, without renal calculi, focal lesion, or hydronephrosis. Bladder is unremarkable. Stomach/Bowel: The stomach is nondistended and otherwise within normal limits. Numerous postsurgical changes are present within the bowel. There is a mildly distended loop of small bowel in the left lower quadrant measuring up to 3.3 cm in diameter and there is mild small bowel wall thickening in the mid abdomen and pelvis. Evaluation of the bowel is somewhat limited due to clumping of the small bowel and lack of oral contrast. Right hemicolectomy changes are noted with a large amount of retained stool in the colon. No free air or pneumatosis. Vascular/Lymphatic: A catheter terminates in the inferior vena cava from a right inguinal approach. There is  atherosclerotic calcification of the aorta without evidence of aneurysm. There is a prominent lymph node in the external iliac region on the right measuring 1.2 cm. Nonspecific prominent lymph nodes are also seen in the retroperitoneum in the periaortic space on the left. Reproductive: The uterus is surgically absent. Other: No free fluid in the pelvis. Musculoskeletal: There is diffuse anasarca. No acute osseous abnormality. IMPRESSION: 1. Multifocal surgical changes in the abdomen and pelvis. There are a few distended loops of small bowel in the abdomen measuring up to 3.3 cm in the left lower quadrant, similar in appearance to the prior exam and may represent  patulous bowel versus partial or early small bowel obstruction. 2. Status post right hemicolectomy with a large amount of stool in the residual colon and rectum. 3. Anasarca. Electronically Signed   By: Brett Fairy M.D.   On: 10/08/2021 02:16   DG Chest Port 1 View  Result Date: 10/07/2021 CLINICAL DATA:  Sepsis. EXAM: PORTABLE CHEST 1 VIEW COMPARISON:  06/19/2021. FINDINGS: The heart size and mediastinal contours are within normal limits. Both lungs are clear. No acute osseous abnormality. Surgical clips are present over the right chest and abdomen. IMPRESSION: No acute cardiopulmonary process. Electronically Signed   By: Brett Fairy M.D.   On: 10/07/2021 23:38    EKG: None obtained in ED  Assessment/Plan Febrile illness of unknown origin     - admit to inpt, med-surg     - started on vanc, merrem; continue for now     - she comes in w/ recurrent PICC infections; this is most likely source; lets see bld Cx first before deciding on what to do with PICC     - follow bld cx, Ucc  Partial SBO Hx of Crohns s/p right hemicolectomy Short bowel syndrome Chronic PICC for TPN     - allow diet; start w/ clear liquids     - hold TPN for right now; follow blood cultures     - bowel regimen, fluids, anti-emetics     - sidebarred gen surg; will be available if needed  Elevated LFTs     - check hepatitis panel     - liver with mild ductal dilation that may be wnl for post chole status     - t bili is ok  CKD3b     - at baseline     - watch nephrotoxins  HTN     - resume home regimen  Normocytic anemia Thrombocytopenia     - hold fe2+ supplement til we see Bld Cx     - she is at baseline Hgb, no evidence of bleed, follow  DVT prophylaxis: lovenox  Code Status: FULL  Family Communication: None at bedside  Consults called: General surgery   Status is: Inpatient  Remains inpatient appropriate because: severity of illness  Jonnie Finner DO [facility]  If 7PM-7AM, please  contact night-coverage [email or web address]  10/08/2021, 7:54 AM

## 2021-10-08 NOTE — Plan of Care (Signed)
Plan of care discussed.   

## 2021-10-08 NOTE — Progress Notes (Signed)
Pharmacy Antibiotic Note  Caitlin Peters is a 60 y.o. female admitted on 10/07/2021 with sepsis.  Pharmacy has been consulted for vancomycin and meropenem dosing. SCr noted to be acutely elevated on admission (baseline ~1.2-1.3).  Plan: Vancomycin 1500 mg IV now, then 750 mg IV q24 hr (est AUC 446 based on SCr 1.68; Vd 0.72) Measure vancomycin AUC at steady state as indicated SCr daily while on vanc given existing AKI and potential for further insult Meropenem 1g IV q12 hr   Height: 5' 8"  (172.7 cm) Weight: 67.8 kg (149 lb 7.6 oz) IBW/kg (Calculated) : 63.9  Temp (24hrs), Avg:99.8 F (37.7 C), Min:97.9 F (36.6 C), Max:102.6 F (39.2 C)  Recent Labs  Lab 10/08/21 0002  WBC 7.3  CREATININE 1.68*  LATICACIDVEN 1.1    Estimated Creatinine Clearance: 35.9 mL/min (A) (by C-G formula based on SCr of 1.68 mg/dL (H)).    Allergies  Allergen Reactions   Meperidine Hives    Other reaction(s): GI Upset Due to Chrones    Hyoscyamine Hives and Swelling    Legs swelling Disorientation   Cefepime Other (See Comments)    Neurotoxicity occurring in setting of AKI. Ceftriaxone tolerated during same admit   Gabapentin Other (See Comments)    unknown   Lyrica [Pregabalin] Other (See Comments)    Has chronic dehydration and made it worse   Topamax [Topiramate] Other (See Comments)    Has chronic dehydration and made it worse   Zosyn [Piperacillin Sod-Tazobactam So] Other (See Comments)    Patient reports it makes her vomit, her neck stiff, and her "heart feel funny" Affected kidneys   Fentanyl Rash    Pt is allergic to fentanyl patch related to the glue (gives her a rash) Pt states she is NOT allergic to fentanyl IV medicine   Morphine And Related Rash    Antimicrobials this admission: 11/13 vancomycin >>  11/13 meropenem >>   Dose adjustments this admission: n/a  Microbiology results: 11/13 BCx: sent   Thank you for allowing pharmacy to be a part of this patient's  care.  Caitlin Peters A 10/08/2021 9:59 AM

## 2021-10-08 NOTE — Progress Notes (Signed)
PHARMACY - PHYSICIAN COMMUNICATION CRITICAL VALUE ALERT - BLOOD CULTURE IDENTIFICATION (BCID)  Caitlin Peters is an 60 y.o. female who presented to [facility] on 10/07/2021 with a chief complaint of fever of unknown origin. On chronic TPN with frequent lin-associated bacteremias  Assessment:  1/4 BCx bottles growing K pneumoniae (suspect central line as source)  Name of physician (or Provider) Contacted: Marylyn Ishihara  Current antibiotics: Vanc, meropenem  Changes to prescribed antibiotics recommended: narrow to Rocephin +/- Flagyl Recommendations declined by provider due to multiple possible sources of infection  Results for orders placed or performed during the hospital encounter of 10/07/21  Blood Culture ID Panel (Reflexed) (Collected: 10/08/2021 12:02 AM)  Result Value Ref Range   Enterococcus faecalis NOT DETECTED NOT DETECTED   Enterococcus Faecium NOT DETECTED NOT DETECTED   Listeria monocytogenes NOT DETECTED NOT DETECTED   Staphylococcus species NOT DETECTED NOT DETECTED   Staphylococcus aureus (BCID) NOT DETECTED NOT DETECTED   Staphylococcus epidermidis NOT DETECTED NOT DETECTED   Staphylococcus lugdunensis NOT DETECTED NOT DETECTED   Streptococcus species NOT DETECTED NOT DETECTED   Streptococcus agalactiae NOT DETECTED NOT DETECTED   Streptococcus pneumoniae NOT DETECTED NOT DETECTED   Streptococcus pyogenes NOT DETECTED NOT DETECTED   A.calcoaceticus-baumannii NOT DETECTED NOT DETECTED   Bacteroides fragilis NOT DETECTED NOT DETECTED   Enterobacterales DETECTED (A) NOT DETECTED   Enterobacter cloacae complex NOT DETECTED NOT DETECTED   Escherichia coli NOT DETECTED NOT DETECTED   Klebsiella aerogenes NOT DETECTED NOT DETECTED   Klebsiella oxytoca NOT DETECTED NOT DETECTED   Klebsiella pneumoniae DETECTED (A) NOT DETECTED   Proteus species NOT DETECTED NOT DETECTED   Salmonella species NOT DETECTED NOT DETECTED   Serratia marcescens NOT DETECTED NOT DETECTED    Haemophilus influenzae NOT DETECTED NOT DETECTED   Neisseria meningitidis NOT DETECTED NOT DETECTED   Pseudomonas aeruginosa NOT DETECTED NOT DETECTED   Stenotrophomonas maltophilia NOT DETECTED NOT DETECTED   Candida albicans NOT DETECTED NOT DETECTED   Candida auris NOT DETECTED NOT DETECTED   Candida glabrata NOT DETECTED NOT DETECTED   Candida krusei NOT DETECTED NOT DETECTED   Candida parapsilosis NOT DETECTED NOT DETECTED   Candida tropicalis NOT DETECTED NOT DETECTED   Cryptococcus neoformans/gattii NOT DETECTED NOT DETECTED   CTX-M ESBL NOT DETECTED NOT DETECTED   Carbapenem resistance IMP NOT DETECTED NOT DETECTED   Carbapenem resistance KPC NOT DETECTED NOT DETECTED   Carbapenem resistance NDM NOT DETECTED NOT DETECTED   Carbapenem resist OXA 48 LIKE NOT DETECTED NOT DETECTED   Carbapenem resistance VIM NOT DETECTED NOT DETECTED    Ioane Bhola A 10/08/2021  6:19 PM

## 2021-10-08 NOTE — ED Notes (Signed)
Save tubes for morning labs sent to lab: blue, lavender, light green, and gold

## 2021-10-08 NOTE — ED Notes (Signed)
Admitting MD messaged regarding pt's status as a med surge patient, to be sure she does not require a telemetry floor. When this is verified I will reach out to floor RN and update plan of care.

## 2021-10-09 ENCOUNTER — Ambulatory Visit: Payer: Medicare Other

## 2021-10-09 ENCOUNTER — Encounter (HOSPITAL_COMMUNITY): Payer: Self-pay | Admitting: Internal Medicine

## 2021-10-09 DIAGNOSIS — R7881 Bacteremia: Secondary | ICD-10-CM | POA: Diagnosis not present

## 2021-10-09 DIAGNOSIS — B961 Klebsiella pneumoniae [K. pneumoniae] as the cause of diseases classified elsewhere: Secondary | ICD-10-CM

## 2021-10-09 LAB — CBC
HCT: 26.3 % — ABNORMAL LOW (ref 36.0–46.0)
Hemoglobin: 8.4 g/dL — ABNORMAL LOW (ref 12.0–15.0)
MCH: 31.3 pg (ref 26.0–34.0)
MCHC: 31.9 g/dL (ref 30.0–36.0)
MCV: 98.1 fL (ref 80.0–100.0)
Platelets: UNDETERMINED 10*3/uL (ref 150–400)
RBC: 2.68 MIL/uL — ABNORMAL LOW (ref 3.87–5.11)
RDW: 15.7 % — ABNORMAL HIGH (ref 11.5–15.5)
WBC: 3.8 10*3/uL — ABNORMAL LOW (ref 4.0–10.5)
nRBC: 0 % (ref 0.0–0.2)

## 2021-10-09 LAB — HEPATITIS PANEL, ACUTE
HCV Ab: NONREACTIVE
Hep A IgM: NONREACTIVE
Hep B C IgM: NONREACTIVE
Hepatitis B Surface Ag: NONREACTIVE

## 2021-10-09 LAB — COMPREHENSIVE METABOLIC PANEL
ALT: 241 U/L — ABNORMAL HIGH (ref 0–44)
AST: 93 U/L — ABNORMAL HIGH (ref 15–41)
Albumin: 2.6 g/dL — ABNORMAL LOW (ref 3.5–5.0)
Alkaline Phosphatase: 150 U/L — ABNORMAL HIGH (ref 38–126)
Anion gap: 7 (ref 5–15)
BUN: 20 mg/dL (ref 6–20)
CO2: 25 mmol/L (ref 22–32)
Calcium: 8.4 mg/dL — ABNORMAL LOW (ref 8.9–10.3)
Chloride: 104 mmol/L (ref 98–111)
Creatinine, Ser: 1.63 mg/dL — ABNORMAL HIGH (ref 0.44–1.00)
GFR, Estimated: 36 mL/min — ABNORMAL LOW (ref >60)
Glucose, Bld: 83 mg/dL (ref 70–99)
Potassium: 3.5 mmol/L (ref 3.5–5.1)
Sodium: 136 mmol/L (ref 135–145)
Total Bilirubin: 0.9 mg/dL (ref 0.3–1.2)
Total Protein: 5.7 g/dL — ABNORMAL LOW (ref 6.5–8.1)

## 2021-10-09 MED ORDER — SODIUM CHLORIDE 0.9 % IV SOLN
2.0000 g | Freq: Every day | INTRAVENOUS | Status: DC
  Administered 2021-10-09 – 2021-10-10 (×2): 2 g via INTRAVENOUS
  Filled 2021-10-09 (×2): qty 20

## 2021-10-09 MED ORDER — SODIUM CHLORIDE 0.9% FLUSH
10.0000 mL | INTRAVENOUS | Status: DC | PRN
  Administered 2021-10-14: 10 mL
  Administered 2021-10-16 (×2): 20 mL

## 2021-10-09 MED ORDER — SODIUM CHLORIDE 0.9% FLUSH
10.0000 mL | Freq: Two times a day (BID) | INTRAVENOUS | Status: DC
  Administered 2021-10-10 (×2): 10 mL

## 2021-10-09 NOTE — Progress Notes (Signed)
Per Dr. [place] collect one set of blood cultures from CL.  Verified phlebotomy was unable to collect both samples according to the policy.

## 2021-10-09 NOTE — Progress Notes (Signed)
PROGRESS NOTE    MCKENZEY PARCELL  VPX:[id number] DOB: 08-07-1961 DOA: 10/07/2021 PCP: [name], [name], MD    Brief Narrative:  Caitlin Peters is a 60 year old female with past medical history significant for Crohn's disease, short bowel syndrome, severe protein calorie malnutrition on TPN since 2003 via right thigh PICC line, essential hypertension, GERD, anxiety/depression, seizure disorder who presents to [facility] ED on 11/12 with fever.  Patient reports day prior, returned from a walk and started having crampy abdominal pain and throbbing in her low back.  She noted she had a fever of 102.0 F at home and took Tylenol.  Due to concern for recurrent history of infections, she called EMS and was brought to the ED for further evaluation.  In the ED, temperature 99.3 F, HR 84, RR 15, BP 144/72, SPO2 98% on room air.  Sodium 137, potassium 3.7, chloride 103, CO2 28, glucose 94, BUN 33, creatinine 1.68, magnesium 1.9, alkaline phosphatase 156, lipase 22, AST 267, ALT 221, lactic acid 1.1.  WBC 7.3, hemoglobin 7.8, platelets 126.  COVID-19 PCR negative.  Influenza A/B PCR negative.  CT abdomen/pelvis with contrast with multifocal cervical changes abdomen/pelvis with few distended small bowel loops measuring up to 3.3 cm left lower quadrant similar in appearance to prior exam which could represent patulous bowel versus partial/early SBO, large amount of stool residual colon/rectum and anasarca.  Chest x-ray with no acute cardiopulmonary disease process.   Assessment & Plan:   Principal Problem:   Bacteremia due to Klebsiella pneumoniae Active Problems:   Crohn's disease of both small and large intestine with other complication ([place])   Hypertension   Malnutrition ([place])   Short gut syndrome   Anasarca   On total parenteral nutrition (TPN)   LFTs abnormal   Anemia   History of central line-associated bloodstream infection (CLABSI)   CKD (chronic kidney disease) stage 3, GFR 30-59 ml/min  ([name])   SBO (small bowel obstruction) ([name])   Klebsiella pneumonia septicemia, POA Patient presenting to ED with recurrent fever.  History of CLABSI 2/2 chronic PICC line use for TPN.  Recently discharged following actinobacter septicemia with PICC line change to right thigh.  Blood cultures 2 out of 4 positive for Klebsiella pneumonia.  WBC count 7.3, lactic acid 1.1.  Chest x-ray unrevealing.  Urinalysis not obtained prior to antibiotic initiation.  Initially started on broad-spectrum antibiotics with vancomycin and meropenem. --ID consulted -- Blood cultures 2/4: + Klebsiella pneumonia, pending susceptibilities --De-escalate antibiotics to ceftriaxone 2 g IV every 24 hours --Repeat blood cultures today --Maintain right thigh PICC line due to poor access per ID for now  Elevated LFTs Unclear etiology.  Abdomen/pelvis with no focal liver abnormality.  Patient does report Tylenol use prior to ED presentation for fever. --AST 267>93 --ALT>221>241 --AP 156>150 --Tbili 0.7>0.9 --Continue IV fluid hydration --Avoid hepatotoxins --Continue to trend LFTs daily  Abdominal pain Patulous bowel vs early/partial small bowel obstruction CT abdomen/pelvis with contrast with multifocal cervical changes abdomen/pelvis with few distended small bowel loops measuring up to 3.3 cm left lower quadrant similar in appearance to prior exam which could represent patulous bowel versus partial/early SBO, large amount of stool residual colon/rectum and anasarca. --Monitor stool output  Essential hypertension --Carvedilol 12.5 mg p.o. twice daily --Amlodipine 10 mg p.o. daily --Hydralazine 100 mg p.o. 3 times daily  Seizure Disorder: Keppra 500 mg p.o. twice daily  Short-bowel syndrome --Teduglutide 1.82m [place] daily --Strict I's and O's  Pancreatic insufficiency: Creon 36,000 units 3  times daily  Chronic pain syndrome:  --Methadone 5 mg 5x daily -- Robaxin 5 1 mg p.o. twice daily --Dilaudid 1 mg IV  every 4 hours as needed severe pain  GERD:  --Protonix 40 mg p.o. daily --Carafate 1 g p.o. twice daily --Famotidine 20 mg p.o. daily as needed heartburn/indigestion  CKD stage IIIb Creatinine 1.63, stable. --Avoid nephrotoxins, renally dose all medications --BMP in a.m.  Chronic normocytic/iron deficiency anemia Baseline hemoglobin 7-9 over the past few months.  Hemoglobin 8.4 today, stable.  Chronic thrombocytopenia Etiology likely secondary to chronic illness/malnutrition.  Platelet count stable.  Severe protein calorie malnutrition Etiology likely secondary to underlying Crohn's disease with history of short bowel syndrome.  Has been on TPN since 2003. Body mass index is 22.73 kg/m. --Holding TPN for now given septicemia as above --Regular diet --Dietitian consult   DVT prophylaxis: enoxaparin (LOVENOX) injection 40 mg Start: 10/08/21 1800   Code Status: Full Code Family Communication: No family present at bedside this morning  Disposition Plan:  Level of care: Med-Surg Status is: Inpatient  Remains inpatient appropriate because: IV antibiotics, pending ID consult, awaiting blood culture susceptibilities and repeat blood cultures    Consultants:  Infectious disease, Dr. Gale Journey  Procedures:  None  Antimicrobials:  Vancomycin 11/13 - 11/14 Meropenem 11/13 - 11/14 Ceftriaxone 11/14>>   Subjective: Patient seen examined bedside, resting comfortably.  No complaints this morning.  Continues to be frustrated about recurrent infections.  Denies headache, no chest pain, no palpitations, no shortness of breath, no abdominal pain currently, no current fever/chills/night sweats, no nausea cefonicid diarrhea.  No acute events overnight per nursing staff.  Objective: Vitals:   10/08/21 0636 10/08/21 1420 10/08/21 2137 10/09/21 0533  BP: (!) 142/64 130/65 (!) 176/67 (!) 182/73  Pulse: 73 69 63 (!) 59  Resp:  18 16 16   Temp: 99.3 F (37.4 C)  98.1 F (36.7 C) 98.7 F  (37.1 C)  TempSrc: Oral  Oral Oral  SpO2: 97% 98% 99% 98%  Weight: 67.8 kg     Height: 5' 8"  (1.727 m)       Intake/Output Summary (Last 24 hours) at 10/09/2021 1115 Last data filed at 10/09/2021 0855 Gross per 24 hour  Intake 1370.73 ml  Output 1700 ml  Net -329.27 ml   Filed Weights   10/07/21 2327 10/08/21 0636  Weight: 70.3 kg 67.8 kg    Examination:  General exam: Appears calm and comfortable  Respiratory system: Clear to auscultation. Respiratory effort normal.  On room air Cardiovascular system: S1 & S2 heard, RRR. No JVD, murmurs, rubs, gallops or clicks. No pedal edema. Gastrointestinal system: Abdomen is nondistended, soft and nontender. No organomegaly or masses felt. Normal bowel sounds heard. Central nervous system: Alert and oriented. No focal neurological deficits. Extremities: Symmetric 5 x 5 power.,  Right thigh PICC line noted without erythema/fluctuance Skin: No rashes, lesions or ulcers Psychiatry: Judgement and insight appear normal. Mood & affect appropriate.     Data Reviewed: I have personally reviewed following labs and imaging studies  CBC: Recent Labs  Lab 10/08/21 0002 10/09/21 0842  WBC 7.3 3.8*  NEUTROABS 6.7  --   HGB 7.8* 8.4*  HCT 23.6* 26.3*  MCV 95.2 98.1  PLT 126* PLATELET CLUMPS NOTED ON SMEAR, UNABLE TO ESTIMATE   Basic Metabolic Panel: Recent Labs  Lab 10/08/21 0002 10/09/21 0842  NA 137 136  K 3.7 3.5  CL 103 104  CO2 28 25  GLUCOSE 94 83  BUN 33*  20  CREATININE 1.68* 1.63*  CALCIUM 8.4* 8.4*  MG 1.9  --    GFR: Estimated Creatinine Clearance: 37 mL/min (A) (by C-G formula based on SCr of 1.63 mg/dL (H)). Liver Function Tests: Recent Labs  Lab 10/08/21 0002 10/09/21 0842  AST 267* 93*  ALT 221* 241*  ALKPHOS 156* 150*  BILITOT 0.7 0.9  PROT 5.7* 5.7*  ALBUMIN 2.7* 2.6*   Recent Labs  Lab 10/08/21 0002  LIPASE 22   No results for input(s): AMMONIA in the last 168 hours. Coagulation Profile: No  results for input(s): INR, PROTIME in the last 168 hours. Cardiac Enzymes: No results for input(s): CKTOTAL, CKMB, CKMBINDEX, TROPONINI in the last 168 hours. BNP (last 3 results) No results for input(s): PROBNP in the last 8760 hours. HbA1C: No results for input(s): HGBA1C in the last 72 hours. CBG: No results for input(s): GLUCAP in the last 168 hours. Lipid Profile: No results for input(s): CHOL, HDL, LDLCALC, TRIG, CHOLHDL, LDLDIRECT in the last 72 hours. Thyroid Function Tests: No results for input(s): TSH, T4TOTAL, FREET4, T3FREE, THYROIDAB in the last 72 hours. Anemia Panel: No results for input(s): VITAMINB12, FOLATE, FERRITIN, TIBC, IRON, RETICCTPCT in the last 72 hours. Sepsis Labs: Recent Labs  Lab 10/08/21 0002  LATICACIDVEN 1.1    Recent Results (from the past 240 hour(s))  Blood culture (routine x 2)     Status: None (Preliminary result)   Collection Time: 10/08/21 12:02 AM   Specimen: BLOOD  Result Value Ref Range Status   Specimen Description   Final    BLOOD RIGHT PICC LINE Performed at [facility], [place] [address] [place], [place] [address]    Special Requests   Final    BOTTLES DRAWN AEROBIC AND ANAEROBIC Blood Culture adequate volume Performed at [facility] [address], [place], [place] [address]    Culture  Setup Time PENDING  Incomplete   Culture   Final    NO GROWTH 1 DAY Performed at [facility], [place] [address], [place], [place] [address]    Report Status PENDING  Incomplete  Blood culture (routine x 2)     Status: Abnormal (Preliminary result)   Collection Time: 10/08/21 12:02 AM   Specimen: BLOOD  Result Value Ref Range Status   Specimen Description   Final    BLOOD RIGHT PORTA CATH Performed at [facility] [address] [place], [place] [address]    Special Requests   Final    BOTTLES DRAWN AEROBIC AND ANAEROBIC Blood Culture adequate volume Performed at [facility] [address], [place], [place] [address]    Culture  Setup Time   Final    GRAM NEGATIVE RODS IN BOTH AEROBIC AND ANAEROBIC BOTTLES CRITICAL RESULT CALLED TO, READ BACK BY AND VERIFIED WITH: DREW WOFFORD PHARMD @1808  10/08/21 EB    Culture (A)  Final    KLEBSIELLA PNEUMONIAE SUSCEPTIBILITIES TO FOLLOW Performed at [facility], [place] [address], [place], [place] [address]    Report Status PENDING  Incomplete  Resp Panel by RT-PCR (Flu A&B, Covid) Nasopharyngeal Swab     Status: None   Collection Time: 10/08/21 12:02 AM   Specimen: Nasopharyngeal Swab; Nasopharyngeal(NP) swabs in vial transport medium  Result Value Ref Range Status   SARS Coronavirus 2 by RT PCR NEGATIVE NEGATIVE Final    Comment: (NOTE) SARS-CoV-2 target nucleic acids are NOT DETECTED.  The SARS-CoV-2 RNA is generally detectable  in upper respiratory specimens during the acute phase of infection. The lowest concentration of SARS-CoV-2 viral copies this assay can detect is 138 copies/mL. A negative result does not preclude SARS-Cov-2 infection and should not be used as the sole basis for treatment or other patient management decisions. A negative result may occur with  improper specimen collection/handling, submission of specimen other than nasopharyngeal swab, presence of viral mutation(s) within the areas targeted by this assay, and inadequate number of viral copies(<138 copies/mL). A negative result must be combined with clinical observations, patient history, and epidemiological information. The expected result is Negative.  Fact Sheet for Patients:  EntrepreneurPulse.com.au  Fact Sheet for Healthcare Providers:  IncredibleEmployment.be  This test is no t yet approved or cleared by the [place] FDA and  has been authorized for detection and/or diagnosis of SARS-CoV-2 by FDA under an Emergency Use Authorization (EUA). This EUA will remain   in effect (meaning this test can be used) for the duration of the COVID-19 declaration under Section 564(b)(1) of the Act, 21 U.S.C.section 360bbb-3(b)(1), unless the authorization is terminated  or revoked sooner.       Influenza A by PCR NEGATIVE NEGATIVE Final   Influenza B by PCR NEGATIVE NEGATIVE Final    Comment: (NOTE) The Xpert Xpress SARS-CoV-2/FLU/RSV plus assay is intended as an aid in the diagnosis of influenza from Nasopharyngeal swab specimens and should not be used as a sole basis for treatment. Nasal washings and aspirates are unacceptable for Xpert Xpress SARS-CoV-2/FLU/RSV testing.  Fact Sheet for Patients: EntrepreneurPulse.com.au  Fact Sheet for Healthcare Providers: IncredibleEmployment.be  This test is not yet approved or cleared by the [place] FDA and has been authorized for detection and/or diagnosis of SARS-CoV-2 by FDA under an Emergency Use Authorization (EUA). This EUA will remain in effect (meaning this test can be used) for the duration of the COVID-19 declaration under Section 564(b)(1) of the Act, 21 U.S.C. section 360bbb-3(b)(1), unless the authorization is terminated or revoked.  Performed at [facility], [place] [address], [place], [place] [address]   Blood Culture ID Panel (Reflexed)     Status: Abnormal   Collection Time: 10/08/21 12:02 AM  Result Value Ref Range Status   Enterococcus faecalis NOT DETECTED NOT DETECTED Final   Enterococcus Faecium NOT DETECTED NOT DETECTED Final   Listeria monocytogenes NOT DETECTED NOT DETECTED Final   Staphylococcus species NOT DETECTED NOT DETECTED Final   Staphylococcus aureus (BCID) NOT DETECTED NOT DETECTED Final   Staphylococcus epidermidis NOT DETECTED NOT DETECTED Final   Staphylococcus lugdunensis NOT DETECTED NOT DETECTED Final   Streptococcus species NOT DETECTED NOT DETECTED Final   Streptococcus agalactiae NOT DETECTED NOT  DETECTED Final   Streptococcus pneumoniae NOT DETECTED NOT DETECTED Final   Streptococcus pyogenes NOT DETECTED NOT DETECTED Final   A.calcoaceticus-baumannii NOT DETECTED NOT DETECTED Final   Bacteroides fragilis NOT DETECTED NOT DETECTED Final   Enterobacterales DETECTED (A) NOT DETECTED Final    Comment: Enterobacterales represent a large order of gram negative bacteria, not a single organism. CRITICAL RESULT CALLED TO, READ BACK BY AND VERIFIED WITH: DREW WOFFORD PHARMD @1808  10/08/21 EB    Enterobacter cloacae complex NOT DETECTED NOT DETECTED Final   Escherichia coli NOT DETECTED NOT DETECTED Final   Klebsiella aerogenes NOT DETECTED NOT DETECTED Final   Klebsiella oxytoca NOT DETECTED NOT DETECTED Final   Klebsiella pneumoniae DETECTED (A) NOT DETECTED Final    Comment: CRITICAL RESULT CALLED TO, READ BACK BY AND VERIFIED WITH:  DREW WOFFORD PHARMD @1808  10/08/21 EB    Proteus species NOT DETECTED NOT DETECTED Final   Salmonella species NOT DETECTED NOT DETECTED Final   Serratia marcescens NOT DETECTED NOT DETECTED Final   Haemophilus influenzae NOT DETECTED NOT DETECTED Final   Neisseria meningitidis NOT DETECTED NOT DETECTED Final   Pseudomonas aeruginosa NOT DETECTED NOT DETECTED Final   Stenotrophomonas maltophilia NOT DETECTED NOT DETECTED Final   Candida albicans NOT DETECTED NOT DETECTED Final   Candida auris NOT DETECTED NOT DETECTED Final   Candida glabrata NOT DETECTED NOT DETECTED Final   Candida krusei NOT DETECTED NOT DETECTED Final   Candida parapsilosis NOT DETECTED NOT DETECTED Final   Candida tropicalis NOT DETECTED NOT DETECTED Final   Cryptococcus neoformans/gattii NOT DETECTED NOT DETECTED Final   CTX-M ESBL NOT DETECTED NOT DETECTED Final   Carbapenem resistance IMP NOT DETECTED NOT DETECTED Final   Carbapenem resistance KPC NOT DETECTED NOT DETECTED Final   Carbapenem resistance NDM NOT DETECTED NOT DETECTED Final   Carbapenem resist OXA 48 LIKE NOT  DETECTED NOT DETECTED Final   Carbapenem resistance VIM NOT DETECTED NOT DETECTED Final    Comment: Performed at [facility], [address], [place], [place] [address]         Radiology Studies: CT ABDOMEN PELVIS W CONTRAST  Result Date: 10/08/2021 CLINICAL DATA:  Inflammatory bowel disease, Crohn's, recent colitis. Abdominal pain. EXAM: CT ABDOMEN AND PELVIS WITH CONTRAST TECHNIQUE: Multidetector CT imaging of the abdomen and pelvis was performed using the standard protocol following bolus administration of intravenous contrast. CONTRAST:  43m OMNIPAQUE IOHEXOL 350 MG/ML SOLN COMPARISON:  09/25/2021. FINDINGS: Lower chest: Mild dependent atelectasis is noted at the lung bases. The heart is borderline enlarged. Hepatobiliary: No focal liver abnormality is seen. Mild intrahepatic and extrahepatic biliary ductal dilatation with the gallbladder measuring 1 cm, which may be within normal limits for patient's post cholecystectomy status. Pancreas: There is diffuse dilatation the pancreatic duct measuring up to 4 mm, unchanged from the prior exam. No surrounding fat stranding is seen. Spleen: Normal in size without focal abnormality. Adrenals/Urinary Tract: Adrenal glands are unremarkable. Kidneys are normal, without renal calculi, focal lesion, or hydronephrosis. Bladder is unremarkable. Stomach/Bowel: The stomach is nondistended and otherwise within normal limits. Numerous postsurgical changes are present within the bowel. There is a mildly distended loop of small bowel in the left lower quadrant measuring up to 3.3 cm in diameter and there is mild small bowel wall thickening in the mid abdomen and pelvis. Evaluation of the bowel is somewhat limited due to clumping of the small bowel and lack of oral contrast. Right hemicolectomy changes are noted with a large amount of retained stool in the colon. No free air or pneumatosis. Vascular/Lymphatic: A catheter terminates in the inferior vena cava from  a right inguinal approach. There is atherosclerotic calcification of the aorta without evidence of aneurysm. There is a prominent lymph node in the external iliac region on the right measuring 1.2 cm. Nonspecific prominent lymph nodes are also seen in the retroperitoneum in the periaortic space on the left. Reproductive: The uterus is surgically absent. Other: No free fluid in the pelvis. Musculoskeletal: There is diffuse anasarca. No acute osseous abnormality. IMPRESSION: 1. Multifocal surgical changes in the abdomen and pelvis. There are a few distended loops of small bowel in the abdomen measuring up to 3.3 cm in the left lower quadrant, similar in appearance to the prior exam and may represent patulous bowel versus partial or  early small bowel obstruction. 2. Status post right hemicolectomy with a large amount of stool in the residual colon and rectum. 3. Anasarca. Electronically Signed   By: Brett Fairy M.D.   On: 10/08/2021 02:16   DG Chest Port 1 View  Result Date: 10/07/2021 CLINICAL DATA:  Sepsis. EXAM: PORTABLE CHEST 1 VIEW COMPARISON:  06/19/2021. FINDINGS: The heart size and mediastinal contours are within normal limits. Both lungs are clear. No acute osseous abnormality. Surgical clips are present over the right chest and abdomen. IMPRESSION: No acute cardiopulmonary process. Electronically Signed   By: Brett Fairy M.D.   On: 10/07/2021 23:38        Scheduled Meds:  amLODipine  10 mg Oral Daily   calcium carbonate  1 tablet Oral Q breakfast   carvedilol  12.5 mg Oral BID   Chlorhexidine Gluconate Cloth  6 each Topical Daily   cholecalciferol  1,000 Units Oral Daily   cycloSPORINE  1 drop Both Eyes BID   enoxaparin (LOVENOX) injection  40 mg Subcutaneous Q24H   estradiol  2 mg Oral Daily   hydrALAZINE  100 mg Oral TID   levETIRAcetam  500 mg Oral BID   lipase/protease/amylase  36,000 Units Oral TID WC   methadone  5 mg Oral 5 X Daily   methocarbamol  500 mg Oral BID    multivitamin-iron-minerals-folic acid   Oral Daily   pantoprazole  40 mg Oral Daily   sucralfate  1 g Oral BID   Teduglutide (rDNA)  1.5 mg Subcutaneous Daily   vitamin B-12  1,000 mcg Oral Daily   Continuous Infusions:  sodium chloride 75 mL/hr at 10/09/21 1030   cefTRIAXone (ROCEPHIN)  IV 2 g (10/09/21 1034)     LOS: 1 day    Time spent: 39 minutes spent on chart review, discussion with nursing staff, consultants, updating family and interview/physical exam; more than 50% of that time was spent in counseling and/or coordination of care.    Marleny Faller J [place] DO [facility] Available via Epic secure chat 7am-7pm After these hours, please refer to coverage provider listed on [email or web address] 10/09/2021, 11:15 AM

## 2021-10-09 NOTE — Plan of Care (Signed)
  Problem: Activity: Goal: Risk for activity intolerance will decrease Outcome: Progressing   Problem: Pain Managment: Goal: General experience of comfort will improve Outcome: Progressing   Problem: Safety: Goal: Ability to remain free from injury will improve Outcome: Progressing   

## 2021-10-09 NOTE — Consult Note (Signed)
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, crohn's hx, recurrent clabsi, difficult line access (currently right femoral line), most recently acinetobacter clabsi s/p treatment course by 11/10 readmitted 11/12 with fever and abd pain (similar to prior colitis/bacteremia) found to have kleb pna bacteremia  11/13 bcx kleb pna by bcid; no molecular resistant gene    Plan: Continue ceftriaxone for now Difficult situation with need for picc and multiple prior line placement now difficult to replace Will consider abx lock therapy if feasible, along with planned 2 weeks systemic abx therapy Repeat bcx F/u susceptibility Discussed with primary team      ------------------------------------------------ Principal Problem:   Bacteremia due to Klebsiella pneumoniae Active Problems:   Crohn's disease of both small and large intestine with other complication ([place])   Hypertension   Malnutrition ([place])   Short gut syndrome   Anasarca   On total parenteral nutrition (TPN)   LFTs abnormal   Anemia   History of central line-associated bloodstream infection (CLABSI)   CKD (chronic kidney disease) stage 3, GFR 30-59 ml/min ([name])   SBO (small bowel obstruction) ([place])    HPI: Caitlin Peters is a 60 y.o. female hx clabsi recurrent, most recent acinetobacter, crohn, short gut, on tpn readmitted 11/12 with fev/erabd pain    Just finished clabsi retained line for acinetobacter on 11/10  On 11/12 has fever and acute abd pain. Similar to prior clabsi No diarrhea  Admitted and w/u so far showed kleb pna bacteremia  Doing well otherwise, improved abd pain   Rever resolved No n/v, cough,  chest pain, new joint/back pain, rash  She understands this is likely another episode of bacteremia, and that she has difficulty with new central line  She has had abx lock therapy prior   Family History  Problem Relation Age of Onset   Seizures Mother    Glaucoma Mother    CAD Father    Heart disease Father    Hypertension Father    Breast cancer Sister    Multiple sclerosis Sister    Diabetes Sister    Lupus Sister    Colon cancer Other    Crohn's disease Other     Social History   Tobacco Use   Smoking status: Former   Smokeless tobacco: Never  Scientific laboratory technician Use: Never used  Substance Use Topics   Alcohol use: Not Currently   Drug use: Never    Allergies  Allergen Reactions   Meperidine Hives    Other reaction(s): GI Upset Due to [place]    Hyoscyamine Hives and Swelling    Legs swelling Disorientation   Cefepime Other (See Comments)    Neurotoxicity occurring in setting of AKI. Ceftriaxone tolerated during same admit   Gabapentin Other (See Comments)    unknown   Lyrica [Pregabalin] Other (See Comments)    Has chronic dehydration and made it worse   Topamax [Topiramate] Other (See Comments)    Has chronic dehydration and made it worse   Zosyn [Piperacillin Sod-Tazobactam So] Other (See Comments)    Patient reports it makes her vomit, her neck stiff,  and her "heart feel funny" Affected kidneys   Fentanyl Rash    Pt is allergic to fentanyl patch related to the glue (gives her a rash) Pt states she is NOT allergic to fentanyl IV medicine   Morphine And Related Rash    Review of Systems: ROS All Other ROS was negative, except mentioned above   Past Medical History:  Diagnosis Date   Acute pancreatitis 04/13/2020   Anasarca 10/2019   AVN (avascular necrosis of bone) ([name])    Cataract    Choledocholithiasis (sludge) s/p ERCP 10/2019 10/21/2020   Chronic pain syndrome    CKD (chronic kidney disease), stage III ([place])    Crohn disease ([place])     Crohn's disease of small & large intestine with SGS 1984   Caitlin Peters is a 60 year old female with a history of Crohn's disease diagnosed in 10 (age 11), history of long-term steroid use with osteoporosis, S/P multiple bowel resections ([id number]) complicated by chronic back and abdominal pain, steatorrhea and short bowel syndrome. The patient has been left with ~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 attached to proximal transverse colon through rectum. She has been   Depression    Diverticulosis    GERD (gastroesophageal reflux disease)    HTN (hypertension)    IDA (iron deficiency anemia)    Malnutrition ([name])    Mass in chest    Osteoporosis    Osteoporosis 12/24/2014   Pancreatitis    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; 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; jejunal to transverse colon anastomosis Treated at [facility] lengthening to 165cm Dr Alene Mires, [facility]   Vitamin B12 deficiency        Scheduled Meds:  amLODipine  10 mg Oral Daily   calcium carbonate  1 tablet Oral Q breakfast   carvedilol  12.5 mg Oral BID   Chlorhexidine Gluconate Cloth  6 each Topical Daily   cholecalciferol  1,000 Units Oral Daily   cycloSPORINE  1 drop Both Eyes BID   enoxaparin (LOVENOX) injection  40 mg Subcutaneous Q24H   estradiol  2 mg Oral Daily   hydrALAZINE  100 mg Oral TID   levETIRAcetam  500 mg Oral BID   lipase/protease/amylase  36,000 Units Oral TID WC   methadone  5 mg Oral 5 X Daily   methocarbamol  500 mg Oral BID   multivitamin-iron-minerals-folic acid   Oral Daily   pantoprazole  40 mg Oral Daily   sucralfate  1 g Oral BID   Teduglutide (rDNA)  1.5 mg Subcutaneous Daily   vitamin B-12  1,000 mcg Oral Daily   Continuous Infusions:  sodium chloride 75 mL/hr at 10/09/21 1030   cefTRIAXone (ROCEPHIN)  IV 2 g (10/09/21 1034)   PRN Meds:.famotidine, HYDROmorphone (DILAUDID)  injection, polyvinyl alcohol, prochlorperazine, promethazine   OBJECTIVE: Blood pressure (!) 182/73, pulse (!) 59, temperature 98.7 F (37.1 C), temperature source Oral, resp. rate 16, height 5' 8"  (1.727 m), weight 67.8 kg, SpO2 98 %.  Physical Exam  General/constitutional: no distress, pleasant HEENT: Normocephalic, PER, Conj Clear, EOMI, Oropharynx clear Neck supple CV: rrr no mrg Lungs: clear to auscultation, normal respiratory effort Abd: Soft, Nontender Ext: no edema Skin: No Rash Neuro: nonfocal MSK: no peripheral joint swelling/tenderness/warmth; back spines nontender   Central line presence: right femoral central line site no erythema/purulence/tenderness   Lab Results Lab Results  Component  Value Date   WBC 3.8 (L) 10/09/2021   HGB 8.4 (L) 10/09/2021   HCT 26.3 (L) 10/09/2021   MCV 98.1 10/09/2021   PLT PLATELET CLUMPS NOTED ON SMEAR, UNABLE TO ESTIMATE 10/09/2021    Lab Results  Component Value Date   CREATININE 1.63 (H) 10/09/2021   BUN 20 10/09/2021   NA 136 10/09/2021   K 3.5 10/09/2021   CL 104 10/09/2021   CO2 25 10/09/2021    Lab Results  Component Value Date   ALT 241 (H) 10/09/2021   AST 93 (H) 10/09/2021   ALKPHOS 150 (H) 10/09/2021   BILITOT 0.9 10/09/2021      Microbiology: Recent Results (from the past 240 hour(s))  Blood culture (routine x 2)     Status: None (Preliminary result)   Collection Time: 10/08/21 12:02 AM   Specimen: BLOOD  Result Value Ref Range Status   Specimen Description   Final    BLOOD RIGHT PICC LINE Performed at [facility], [place] [address], [place], [place] [address]    Special Requests   Final    BOTTLES DRAWN AEROBIC AND ANAEROBIC Blood Culture adequate volume Performed at [facility] [address], [place], [place] [address]    Culture  Setup Time PENDING  Incomplete   Culture   Final    NO GROWTH 1 DAY Performed at [facility], [place] [address],  [place], [place] [address]    Report Status PENDING  Incomplete  Blood culture (routine x 2)     Status: Abnormal (Preliminary result)   Collection Time: 10/08/21 12:02 AM   Specimen: BLOOD  Result Value Ref Range Status   Specimen Description   Final    BLOOD RIGHT PORTA CATH Performed at [facility] [address] [place], [place] [address]    Special Requests   Final    BOTTLES DRAWN AEROBIC AND ANAEROBIC Blood Culture adequate volume Performed at [facility] [address], [place], [place] [address]    Culture  Setup Time   Final    GRAM NEGATIVE RODS IN BOTH AEROBIC AND ANAEROBIC BOTTLES CRITICAL RESULT CALLED TO, READ BACK BY AND VERIFIED WITH: DREW [facility] PHARMD @1808  10/08/21 EB    Culture (A)  Final    KLEBSIELLA PNEUMONIAE SUSCEPTIBILITIES TO FOLLOW Performed at [facility], [place] [address] [place],  [address]    Report Status PENDING  Incomplete  Resp Panel by RT-PCR (Flu A&B, Covid) Nasopharyngeal Swab     Status: None   Collection Time: 10/08/21 12:02 AM   Specimen: Nasopharyngeal Swab; Nasopharyngeal(NP) swabs in vial transport medium  Result Value Ref Range Status   SARS Coronavirus 2 by RT PCR NEGATIVE NEGATIVE Final    Comment: (NOTE) SARS-CoV-2 target nucleic acids are NOT DETECTED.  The SARS-CoV-2 RNA is generally detectable in upper respiratory specimens during the acute phase of infection. The lowest concentration of SARS-CoV-2 viral copies this assay can detect is 138 copies/mL. A negative result does not preclude SARS-Cov-2 infection and should not be used as the sole basis for treatment or other patient management decisions. A negative result may occur with  improper specimen collection/handling, submission of specimen other than nasopharyngeal swab, presence of viral mutation(s) within the areas targeted by this assay, and inadequate number of viral copies(<138 copies/mL). A negative result must  be combined with clinical observations, patient history, and epidemiological information. The expected result is Negative.  Fact Sheet for  Patients:  EntrepreneurPulse.com.au  Fact Sheet for Healthcare Providers:  IncredibleEmployment.be  This test is no t yet approved or cleared by the [place] FDA and  has been authorized for detection and/or diagnosis of SARS-CoV-2 by FDA under an Emergency Use Authorization (EUA). This EUA will remain  in effect (meaning this test can be used) for the duration of the COVID-19 declaration under Section 564(b)(1) of the Act, 21 U.S.C.section 360bbb-3(b)(1), unless the authorization is terminated  or revoked sooner.       Influenza A by PCR NEGATIVE NEGATIVE Final   Influenza B by PCR NEGATIVE NEGATIVE Final    Comment: (NOTE) The Xpert Xpress SARS-CoV-2/FLU/RSV plus assay is intended as an aid in the diagnosis of influenza from Nasopharyngeal swab specimens and should not be used as a sole basis for treatment. Nasal washings and aspirates are unacceptable for Xpert Xpress SARS-CoV-2/FLU/RSV testing.  Fact Sheet for Patients: EntrepreneurPulse.com.au  Fact Sheet for Healthcare Providers: IncredibleEmployment.be  This test is not yet approved or cleared by the [place] FDA and has been authorized for detection and/or diagnosis of SARS-CoV-2 by FDA under an Emergency Use Authorization (EUA). This EUA will remain in effect (meaning this test can be used) for the duration of the COVID-19 declaration under Section 564(b)(1) of the Act, 21 U.S.C. section 360bbb-3(b)(1), unless the authorization is terminated or revoked.  Performed at [facility] [place] [address] [place], [place] [address]   Blood Culture ID Panel (Reflexed)     Status: Abnormal   Collection Time: 10/08/21 12:02 AM  Result Value Ref Range Status   Enterococcus faecalis NOT  DETECTED NOT DETECTED Final   Enterococcus Faecium NOT DETECTED NOT DETECTED Final   Listeria monocytogenes NOT DETECTED NOT DETECTED Final   Staphylococcus species NOT DETECTED NOT DETECTED Final   Staphylococcus aureus (BCID) NOT DETECTED NOT DETECTED Final   Staphylococcus epidermidis NOT DETECTED NOT DETECTED Final   Staphylococcus lugdunensis NOT DETECTED NOT DETECTED Final   Streptococcus species NOT DETECTED NOT DETECTED Final   Streptococcus agalactiae NOT DETECTED NOT DETECTED Final   Streptococcus pneumoniae NOT DETECTED NOT DETECTED Final   Streptococcus pyogenes NOT DETECTED NOT DETECTED Final   A.calcoaceticus-baumannii NOT DETECTED NOT DETECTED Final   Bacteroides fragilis NOT DETECTED NOT DETECTED Final   Enterobacterales DETECTED (A) NOT DETECTED Final    Comment: Enterobacterales represent a large order of gram negative bacteria, not a single organism. CRITICAL RESULT CALLED TO, READ BACK BY AND VERIFIED WITH: DREW WOFFORD PHARMD @1808  10/08/21 EB    Enterobacter cloacae complex NOT DETECTED NOT DETECTED Final   Escherichia coli NOT DETECTED NOT DETECTED Final   Klebsiella aerogenes NOT DETECTED NOT DETECTED Final   Klebsiella oxytoca NOT DETECTED NOT DETECTED Final   Klebsiella pneumoniae DETECTED (A) NOT DETECTED Final    Comment: CRITICAL RESULT CALLED TO, READ BACK BY AND VERIFIED WITH: DREW WOFFORD PHARMD @1808  10/08/21 EB    Proteus species NOT DETECTED NOT DETECTED Final   Salmonella species NOT DETECTED NOT DETECTED Final   Serratia marcescens NOT DETECTED NOT DETECTED Final   Haemophilus influenzae NOT DETECTED NOT DETECTED Final   Neisseria meningitidis NOT DETECTED NOT DETECTED Final   Pseudomonas aeruginosa NOT DETECTED NOT DETECTED Final   Stenotrophomonas maltophilia NOT DETECTED NOT DETECTED Final   Candida albicans NOT DETECTED NOT DETECTED Final   Candida auris NOT DETECTED NOT DETECTED Final   Candida glabrata NOT DETECTED NOT DETECTED Final    Candida krusei NOT DETECTED NOT DETECTED Final  Candida parapsilosis NOT DETECTED NOT DETECTED Final   Candida tropicalis NOT DETECTED NOT DETECTED Final   Cryptococcus neoformans/gattii NOT DETECTED NOT DETECTED Final   CTX-M ESBL NOT DETECTED NOT DETECTED Final   Carbapenem resistance IMP NOT DETECTED NOT DETECTED Final   Carbapenem resistance KPC NOT DETECTED NOT DETECTED Final   Carbapenem resistance NDM NOT DETECTED NOT DETECTED Final   Carbapenem resist OXA 48 LIKE NOT DETECTED NOT DETECTED Final   Carbapenem resistance VIM NOT DETECTED NOT DETECTED Final    Comment: Performed at [facility], [place] [address], [place],  [address]     Serology:    Imaging: If present, new imagings (plain films, ct scans, and mri) have been personally visualized and interpreted; radiology reports have been reviewed. Decision making incorporated into the Impression / Recommendations.    Jabier Mutton, [facility] for Infectious [place] [phone] pager    10/09/2021, 12:11 PM

## 2021-10-10 DIAGNOSIS — Z789 Other specified health status: Secondary | ICD-10-CM

## 2021-10-10 DIAGNOSIS — K912 Postsurgical malabsorption, not elsewhere classified: Secondary | ICD-10-CM

## 2021-10-10 LAB — COMPREHENSIVE METABOLIC PANEL
ALT: 154 U/L — ABNORMAL HIGH (ref 0–44)
AST: 37 U/L (ref 15–41)
Albumin: 2.3 g/dL — ABNORMAL LOW (ref 3.5–5.0)
Alkaline Phosphatase: 128 U/L — ABNORMAL HIGH (ref 38–126)
Anion gap: 6 (ref 5–15)
BUN: 16 mg/dL (ref 6–20)
CO2: 26 mmol/L (ref 22–32)
Calcium: 8.5 mg/dL — ABNORMAL LOW (ref 8.9–10.3)
Chloride: 107 mmol/L (ref 98–111)
Creatinine, Ser: 1.47 mg/dL — ABNORMAL HIGH (ref 0.44–1.00)
GFR, Estimated: 41 mL/min — ABNORMAL LOW (ref >60)
Glucose, Bld: 97 mg/dL (ref 70–99)
Potassium: 3.2 mmol/L — ABNORMAL LOW (ref 3.5–5.1)
Sodium: 139 mmol/L (ref 135–145)
Total Bilirubin: 0.5 mg/dL (ref 0.3–1.2)
Total Protein: 5.5 g/dL — ABNORMAL LOW (ref 6.5–8.1)

## 2021-10-10 MED ORDER — HYDROMORPHONE HCL 1 MG/ML IJ SOLN
1.0000 mg | INTRAMUSCULAR | Status: DC | PRN
  Filled 2021-10-10: qty 1

## 2021-10-10 MED ORDER — HYDROMORPHONE HCL 1 MG/ML IJ SOLN
1.0000 mg | INTRAMUSCULAR | Status: DC | PRN
  Administered 2021-10-10: 1 mg via INTRAMUSCULAR
  Filled 2021-10-10: qty 1

## 2021-10-10 MED ORDER — CEFAZOLIN SODIUM-DEXTROSE 2-4 GM/100ML-% IV SOLN
2.0000 g | Freq: Two times a day (BID) | INTRAVENOUS | Status: DC
Start: 2021-10-11 — End: 2021-10-11
  Administered 2021-10-11: 2 g via INTRAVENOUS
  Filled 2021-10-10: qty 100

## 2021-10-10 MED ORDER — SODIUM CHLORIDE 0.9 % IJ SOLN
Freq: Every day | Status: DC
  Filled 2021-10-10 (×2): qty 12.6

## 2021-10-10 MED ORDER — HYDROMORPHONE HCL 1 MG/ML IJ SOLN
1.0000 mg | INTRAMUSCULAR | Status: DC | PRN
  Administered 2021-10-10 – 2021-10-11 (×6): 1 mg via INTRAVENOUS
  Filled 2021-10-10 (×5): qty 1

## 2021-10-10 MED ORDER — BOOST / RESOURCE BREEZE PO LIQD CUSTOM
1.0000 | Freq: Two times a day (BID) | ORAL | Status: DC
  Administered 2021-10-10 – 2021-10-15 (×11): 1 via ORAL

## 2021-10-10 MED ORDER — HYDROMORPHONE HCL 1 MG/ML IJ SOLN
1.0000 mg | INTRAMUSCULAR | Status: DC | PRN
  Administered 2021-10-10: 1 mg via INTRAVENOUS
  Filled 2021-10-10: qty 1

## 2021-10-10 MED ORDER — POTASSIUM CHLORIDE CRYS ER 20 MEQ PO TBCR
40.0000 meq | EXTENDED_RELEASE_TABLET | ORAL | Status: AC
  Administered 2021-10-10 (×2): 40 meq via ORAL
  Filled 2021-10-10 (×2): qty 2

## 2021-10-10 NOTE — Progress Notes (Signed)
Diagnosis: Clabsi -- kleb pna (S cefazolin) Tpn/short gut syndrome Chronic right femoral central line  Has had recurrent clabsi and difficulty replacing central line  Repeat blood culture 11/14 if positive likely will need line removed   OPAT Orders Discharge antibiotics to be given via PICC line Discharge antibiotics: cefazolin antibiotics lock therapy (please see pharmacy note), and systemic cefazolin   Duration: 2 weeks End Date: 11/28  [facility] Care Per Protocol:  Home health RN for IV administration and teaching; PICC line care and labs.    Labs weekly while on IV antibiotics: _x_ CBC with differential __ BMP _x_ CMP __ CRP __ ESR __ Vancomycin trough __ CK  __ Please pull PIC at completion of IV antibiotics _x_ Please leave PIC in place u   Fax weekly labs to [phone]   --------- Subjective: Some abd pain (chronic) No bloody stool No increased diarrhea No rash No n/v No fever   Objectives: Vitals:   10/10/21 0528 10/10/21 1403  BP: (!) 165/72 (!) 187/73  Pulse: 63 (!) 58  Resp: 16 19  Temp: 98.4 F (36.9 C) 98.4 F (36.9 C)  SpO2: 99% 100%   General/constitutional: no distress, pleasant HEENT: Normocephalic, PER, Conj Clear, EOMI, Oropharynx clear Neck supple CV: rrr no mrg Lungs: clear to auscultation, normal respiratory effort Abd: Soft, Nontender Ext: no edema Skin: No Rash Neuro: nonfocal MSK: no peripheral joint swelling/tenderness/warmth; back spines nontender   Central line presence: right femoral picc site no erythema/purulence   Labs: Lab Results  Component Value Date   WBC 3.8 (L) 10/09/2021   HGB 8.4 (L) 10/09/2021   HCT 26.3 (L) 10/09/2021   MCV 98.1 10/09/2021   PLT PLATELET CLUMPS NOTED ON SMEAR, UNABLE TO ESTIMATE 76/73/4193   Last metabolic panel Lab Results  Component Value Date   GLUCOSE 97 10/10/2021   NA 139 10/10/2021   K 3.2 (L) 10/10/2021   CL 107 10/10/2021   CO2 26 10/10/2021   BUN 16  10/10/2021   CREATININE 1.47 (H) 10/10/2021   GFRNONAA 41 (L) 10/10/2021   CALCIUM 8.5 (L) 10/10/2021   PHOS 3.7 09/29/2021   PROT 5.5 (L) 10/10/2021   ALBUMIN 2.3 (L) 10/10/2021   BILITOT 0.5 10/10/2021   ALKPHOS 128 (H) 10/10/2021   AST 37 10/10/2021   ALT 154 (H) 10/10/2021   ANIONGAP 6 10/10/2021

## 2021-10-10 NOTE — Progress Notes (Addendum)
PROGRESS NOTE    Caitlin Peters  XNT:[id number] DOB: January 16, 1961 DOA: 10/07/2021 PCP: [name], [name], MD    Brief Narrative:  Caitlin Peters is a 60 year old female with past medical history significant for Crohn's disease, short bowel syndrome, severe protein calorie malnutrition on TPN since 2003 via right thigh PICC line, essential hypertension, GERD, anxiety/depression, seizure disorder who presents to [facility] ED on 11/12 with fever.  Patient reports day prior, returned from a walk and started having crampy abdominal pain and throbbing in her low back.  She noted she had a fever of 102.0 F at home and took Tylenol.  Due to concern for recurrent history of infections, she called EMS and was brought to the ED for further evaluation.  In the ED, temperature 99.3 F, HR 84, RR 15, BP 144/72, SPO2 98% on room air.  Sodium 137, potassium 3.7, chloride 103, CO2 28, glucose 94, BUN 33, creatinine 1.68, magnesium 1.9, alkaline phosphatase 156, lipase 22, AST 267, ALT 221, lactic acid 1.1.  WBC 7.3, hemoglobin 7.8, platelets 126.  COVID-19 PCR negative.  Influenza A/B PCR negative.  CT abdomen/pelvis with contrast with multifocal cervical changes abdomen/pelvis with few distended small bowel loops measuring up to 3.3 cm left lower quadrant similar in appearance to prior exam which could represent patulous bowel versus partial/early SBO, large amount of stool residual colon/rectum and anasarca.  Chest x-ray with no acute cardiopulmonary disease process.   Assessment & Plan:   Principal Problem:   Bacteremia due to Klebsiella pneumoniae Active Problems:   Crohn's disease of both small and large intestine with other complication ([place])   Hypertension   Malnutrition ([place])   Short gut syndrome   Anasarca   On total parenteral nutrition (TPN)   LFTs abnormal   Anemia   History of central line-associated bloodstream infection (CLABSI)   CKD (chronic kidney disease) stage 3, GFR 30-59 ml/min  ([name])   SBO (small bowel obstruction) ([name])   Klebsiella pneumonia septicemia, POA Patient presenting to ED with recurrent fever.  History of CLABSI 2/2 chronic PICC line use for TPN.  Recently discharged following actinobacter septicemia with PICC line change to right thigh.  Blood cultures 2 out of 4 positive for Klebsiella pneumonia.  WBC count 7.3, lactic acid 1.1.  Chest x-ray unrevealing.  Urinalysis not obtained prior to antibiotic initiation.  Initially started on broad-spectrum antibiotics with vancomycin and meropenem. --ID following; appreciate assistance --Blood cultures 2/4: + Klebsiella pneumonia, pending susceptibilities --Repeat blood culture 11/14: Pending --Ceftriaxone 2 g IV every 24 hours --Maintain right thigh PICC line due to poor access per ID for now  Elevated LFTs Unclear etiology.  Abdomen/pelvis with no focal liver abnormality.  Patient does report Tylenol use prior to ED presentation for fever. --AST 267>93>37 --ALT>221>241>154 --AP 156>150>128 --Tbili 0.7>0.9>0.5 --Avoid hepatotoxins --Continue to trend LFTs daily  Abdominal pain Patulous bowel vs early/partial small bowel obstruction CT abdomen/pelvis with contrast with multifocal cervical changes abdomen/pelvis with few distended small bowel loops measuring up to 3.3 cm left lower quadrant similar in appearance to prior exam which could represent patulous bowel versus partial/early SBO, large amount of stool residual colon/rectum and anasarca. --Monitor stool output  Hypokalemia Potassium 3.2, will replete. --Repeat electrolytes in a.m.  Essential hypertension --Carvedilol 12.5 mg p.o. twice daily --Amlodipine 10 mg p.o. daily --Hydralazine 100 mg p.o. 3 times daily  Seizure Disorder: Keppra 500 mg p.o. twice daily  Short-bowel syndrome --Teduglutide 1.59m [place] daily --Strict I's and O's  Pancreatic  insufficiency: Creon 36,000 units 3 times daily  Chronic pain syndrome:  --Methadone 5 mg 5x  daily --Robaxin 5 1 mg p.o. twice daily --Dilaudid 1 mg IV q4h PRN severe pain  GERD:  --Protonix 40 mg p.o. daily --Carafate 1 g p.o. twice daily --Famotidine 20 mg p.o. daily as needed heartburn/indigestion  CKD stage IIIb Creatinine 1.63, stable. --Avoid nephrotoxins, renally dose all medications --BMP in a.m.  Chronic normocytic/iron deficiency anemia Baseline hemoglobin 7-9 over the past few months.  Hemoglobin 8.4, stable.  Chronic thrombocytopenia Etiology likely secondary to chronic illness/malnutrition.  Platelet count stable.  Moderate protein calorie malnutrition Etiology likely secondary to underlying Crohn's disease with history of short bowel syndrome.  Has been on TPN since 2003. Body mass index is 22.73 kg/m. Nutrition Status: Nutrition Problem: Moderate Malnutrition Etiology: chronic illness (short bowel syndrome) Signs/Symptoms: mild fat depletion, mild muscle depletion, moderate muscle depletion Interventions: Boost Breeze, MVI --Holding TPN for now given septicemia as above --Regular diet --Dietitian following   DVT prophylaxis: enoxaparin (LOVENOX) injection 40 mg Start: 10/08/21 1800   Code Status: Full Code Family Communication: No family present at bedside this morning  Disposition Plan:  Level of care: Med-Surg Status is: Inpatient  Remains inpatient appropriate because: IV antibiotics, awaiting blood culture susceptibilities and repeat blood cultures, anticipate discharge 1-2 days with likely need of home IV antibiotics    Consultants:  Infectious disease, Dr. Gale Journey  Procedures:  None  Antimicrobials:  Vancomycin 11/13 - 11/14 Meropenem 11/13 - 11/14 Ceftriaxone 11/14>>   Subjective: Patient seen examined bedside, resting comfortably.  No complaints this morning.  Reading Bible verses.  Appreciative all the care she is receiving at [facility].  Denies headache, no chest pain, no palpitations, no shortness of breath, no  abdominal pain currently, no current fever/chills/night sweats, no nausea cefonicid diarrhea.  No acute events overnight per nursing staff.  Objective: Vitals:   10/09/21 0533 10/09/21 1417 10/09/21 2237 10/10/21 0528  BP: (!) 182/73 (!) 158/65 (!) 156/61 (!) 165/72  Pulse: (!) 59 64 71 63  Resp: 16 18 16 16   Temp: 98.7 F (37.1 C) 98.9 F (37.2 C) 98.3 F (36.8 C) 98.4 F (36.9 C)  TempSrc: Oral Oral Oral Oral  SpO2: 98% 99% 99% 99%  Weight:      Height:        Intake/Output Summary (Last 24 hours) at 10/10/2021 1329 Last data filed at 10/10/2021 1100 Gross per 24 hour  Intake 1514.41 ml  Output --  Net 1514.41 ml   Filed Weights   10/07/21 2327 10/08/21 0636  Weight: 70.3 kg 67.8 kg    Examination:  General exam: Appears calm and comfortable  Respiratory system: Clear to auscultation. Respiratory effort normal.  On room air Cardiovascular system: S1 & S2 heard, RRR. No JVD, murmurs, rubs, gallops or clicks. No pedal edema. Gastrointestinal system: Abdomen is nondistended, soft and nontender. No organomegaly or masses felt. Normal bowel sounds heard. Central nervous system: Alert and oriented. No focal neurological deficits. Extremities: Symmetric 5 x 5 power.,  Right thigh PICC line noted without erythema/fluctuance Skin: No rashes, lesions or ulcers Psychiatry: Judgement and insight appear normal. Mood & affect appropriate.     Data Reviewed: I have personally reviewed following labs and imaging studies  CBC: Recent Labs  Lab 10/08/21 0002 10/09/21 0842  WBC 7.3 3.8*  NEUTROABS 6.7  --   HGB 7.8* 8.4*  HCT 23.6* 26.3*  MCV 95.2 98.1  PLT 126* PLATELET CLUMPS NOTED  ON SMEAR, UNABLE TO ESTIMATE   Basic Metabolic Panel: Recent Labs  Lab 10/08/21 0002 10/09/21 0842 10/10/21 0426  NA 137 136 139  K 3.7 3.5 3.2*  CL 103 104 107  CO2 28 25 26   GLUCOSE 94 83 97  BUN 33* 20 16  CREATININE 1.68* 1.63* 1.47*  CALCIUM 8.4* 8.4* 8.5*  MG 1.9  --   --     GFR: Estimated Creatinine Clearance: 41.1 mL/min (A) (by C-G formula based on SCr of 1.47 mg/dL (H)). Liver Function Tests: Recent Labs  Lab 10/08/21 0002 10/09/21 0842 10/10/21 0426  AST 267* 93* 37  ALT 221* 241* 154*  ALKPHOS 156* 150* 128*  BILITOT 0.7 0.9 0.5  PROT 5.7* 5.7* 5.5*  ALBUMIN 2.7* 2.6* 2.3*   Recent Labs  Lab 10/08/21 0002  LIPASE 22   No results for input(s): AMMONIA in the last 168 hours. Coagulation Profile: No results for input(s): INR, PROTIME in the last 168 hours. Cardiac Enzymes: No results for input(s): CKTOTAL, CKMB, CKMBINDEX, TROPONINI in the last 168 hours. BNP (last 3 results) No results for input(s): PROBNP in the last 8760 hours. HbA1C: No results for input(s): HGBA1C in the last 72 hours. CBG: No results for input(s): GLUCAP in the last 168 hours. Lipid Profile: No results for input(s): CHOL, HDL, LDLCALC, TRIG, CHOLHDL, LDLDIRECT in the last 72 hours. Thyroid Function Tests: No results for input(s): TSH, T4TOTAL, FREET4, T3FREE, THYROIDAB in the last 72 hours. Anemia Panel: No results for input(s): VITAMINB12, FOLATE, FERRITIN, TIBC, IRON, RETICCTPCT in the last 72 hours. Sepsis Labs: Recent Labs  Lab 10/08/21 0002  LATICACIDVEN 1.1    Recent Results (from the past 240 hour(s))  Blood culture (routine x 2)     Status: None (Preliminary result)   Collection Time: 10/08/21 12:02 AM   Specimen: BLOOD  Result Value Ref Range Status   Specimen Description   Final    BLOOD RIGHT PICC LINE Performed at [facility], [place] [address], [place], [place] [address]    Special Requests   Final    BOTTLES DRAWN AEROBIC AND ANAEROBIC Blood Culture adequate volume Performed at [place] [address] [place], [place] [address]    Culture  Setup Time   Final    GRAM NEGATIVE RODS AEROBIC BOTTLE ONLY CRITICAL VALUE NOTED.  VALUE IS CONSISTENT WITH PREVIOUSLY REPORTED AND CALLED  VALUE. Performed at [facility], [place] [address], [place], [place] [address]    Culture GRAM NEGATIVE RODS  Final   Report Status PENDING  Incomplete  Blood culture (routine x 2)     Status: Abnormal (Preliminary result)   Collection Time: 10/08/21 12:02 AM   Specimen: BLOOD  Result Value Ref Range Status   Specimen Description   Final    BLOOD RIGHT PORTA CATH Performed at [place] [address], [place], [place] [address]    Special Requests   Final    BOTTLES DRAWN AEROBIC AND ANAEROBIC Blood Culture adequate volume Performed at [facility] [address] [place], [place] [address]    Culture  Setup Time   Final    GRAM NEGATIVE RODS IN BOTH AEROBIC AND ANAEROBIC BOTTLES CRITICAL RESULT CALLED TO, READ BACK BY AND VERIFIED WITH: DREW [facility] PHARMD @1808  10/08/21 EB Performed at [facility], [place] [address] [place], [place] [address]    Culture KLEBSIELLA PNEUMONIAE (A)  Final   Report Status PENDING  Incomplete   Organism ID, Bacteria KLEBSIELLA PNEUMONIAE  Final      Susceptibility   Klebsiella pneumoniae - MIC*    AMPICILLIN >=32 RESISTANT Resistant     CEFAZOLIN <=4 SENSITIVE Sensitive     CEFEPIME <=0.12 SENSITIVE Sensitive     CEFTAZIDIME <=1 SENSITIVE Sensitive     CEFTRIAXONE <=0.25 SENSITIVE Sensitive     CIPROFLOXACIN <=0.25 SENSITIVE Sensitive     GENTAMICIN <=1 SENSITIVE Sensitive     IMIPENEM <=0.25 SENSITIVE Sensitive     TRIMETH/SULFA <=20 SENSITIVE Sensitive     AMPICILLIN/SULBACTAM 4 SENSITIVE Sensitive     PIP/TAZO <=4 SENSITIVE Sensitive     * KLEBSIELLA PNEUMONIAE  Resp Panel by RT-PCR (Flu A&B, Covid) Nasopharyngeal Swab     Status: None   Collection Time: 10/08/21 12:02 AM   Specimen: Nasopharyngeal Swab; Nasopharyngeal(NP) swabs in vial transport medium  Result Value Ref Range Status   SARS Coronavirus 2 by RT PCR NEGATIVE NEGATIVE Final    Comment: (NOTE) SARS-CoV-2 target nucleic  acids are NOT DETECTED.  The SARS-CoV-2 RNA is generally detectable in upper respiratory specimens during the acute phase of infection. The lowest concentration of SARS-CoV-2 viral copies this assay can detect is 138 copies/mL. A negative result does not preclude SARS-Cov-2 infection and should not be used as the sole basis for treatment or other patient management decisions. A negative result may occur with  improper specimen collection/handling, submission of specimen other than nasopharyngeal swab, presence of viral mutation(s) within the areas targeted by this assay, and inadequate number of viral copies(<138 copies/mL). A negative result must be combined with clinical observations, patient history, and epidemiological information. The expected result is Negative.  Fact Sheet for Patients:  EntrepreneurPulse.com.au  Fact Sheet for Healthcare Providers:  IncredibleEmployment.be  This test is no t yet approved or cleared by the [place] FDA and  has been authorized for detection and/or diagnosis of SARS-CoV-2 by FDA under an Emergency Use Authorization (EUA). This EUA will remain  in effect (meaning this test can be used) for the duration of the COVID-19 declaration under Section 564(b)(1) of the Act, 21 U.S.C.section 360bbb-3(b)(1), unless the authorization is terminated  or revoked sooner.       Influenza A by PCR NEGATIVE NEGATIVE Final   Influenza B by PCR NEGATIVE NEGATIVE Final    Comment: (NOTE) The Xpert Xpress SARS-CoV-2/FLU/RSV plus assay is intended as an aid in the diagnosis of influenza from Nasopharyngeal swab specimens and should not be used as a sole basis for treatment. Nasal washings and aspirates are unacceptable for Xpert Xpress SARS-CoV-2/FLU/RSV testing.  Fact Sheet for Patients: EntrepreneurPulse.com.au  Fact Sheet for Healthcare Providers: IncredibleEmployment.be  This  test is not yet approved or cleared by the [place] FDA and has been authorized for detection and/or diagnosis of SARS-CoV-2 by FDA under an Emergency Use Authorization (EUA). This EUA will remain in effect (meaning this test can be used) for the duration of the COVID-19 declaration under Section 564(b)(1) of the Act, 21 U.S.C. section 360bbb-3(b)(1), unless the authorization is terminated or revoked.  Performed at [facility], [place] [address], [place], [place] [address]   Blood Culture ID Panel (Reflexed)     Status: Abnormal   Collection Time: 10/08/21 12:02 AM  Result Value Ref Range Status   Enterococcus faecalis NOT DETECTED NOT DETECTED Final   Enterococcus Faecium NOT DETECTED NOT DETECTED Final   Listeria monocytogenes NOT DETECTED NOT DETECTED Final   Staphylococcus species NOT DETECTED  NOT DETECTED Final   Staphylococcus aureus (BCID) NOT DETECTED NOT DETECTED Final   Staphylococcus epidermidis NOT DETECTED NOT DETECTED Final   Staphylococcus lugdunensis NOT DETECTED NOT DETECTED Final   Streptococcus species NOT DETECTED NOT DETECTED Final   Streptococcus agalactiae NOT DETECTED NOT DETECTED Final   Streptococcus pneumoniae NOT DETECTED NOT DETECTED Final   Streptococcus pyogenes NOT DETECTED NOT DETECTED Final   A.calcoaceticus-baumannii NOT DETECTED NOT DETECTED Final   Bacteroides fragilis NOT DETECTED NOT DETECTED Final   Enterobacterales DETECTED (A) NOT DETECTED Final    Comment: Enterobacterales represent a large order of gram negative bacteria, not a single organism. CRITICAL RESULT CALLED TO, READ BACK BY AND VERIFIED WITH: DREW WOFFORD PHARMD @1808  10/08/21 EB    Enterobacter cloacae complex NOT DETECTED NOT DETECTED Final   Escherichia coli NOT DETECTED NOT DETECTED Final   Klebsiella aerogenes NOT DETECTED NOT DETECTED Final   Klebsiella oxytoca NOT DETECTED NOT DETECTED Final   Klebsiella pneumoniae DETECTED (A) NOT DETECTED Final     Comment: CRITICAL RESULT CALLED TO, READ BACK BY AND VERIFIED WITH: DREW WOFFORD PHARMD @1808  10/08/21 EB    Proteus species NOT DETECTED NOT DETECTED Final   Salmonella species NOT DETECTED NOT DETECTED Final   Serratia marcescens NOT DETECTED NOT DETECTED Final   Haemophilus influenzae NOT DETECTED NOT DETECTED Final   Neisseria meningitidis NOT DETECTED NOT DETECTED Final   Pseudomonas aeruginosa NOT DETECTED NOT DETECTED Final   Stenotrophomonas maltophilia NOT DETECTED NOT DETECTED Final   Candida albicans NOT DETECTED NOT DETECTED Final   Candida auris NOT DETECTED NOT DETECTED Final   Candida glabrata NOT DETECTED NOT DETECTED Final   Candida krusei NOT DETECTED NOT DETECTED Final   Candida parapsilosis NOT DETECTED NOT DETECTED Final   Candida tropicalis NOT DETECTED NOT DETECTED Final   Cryptococcus neoformans/gattii NOT DETECTED NOT DETECTED Final   CTX-M ESBL NOT DETECTED NOT DETECTED Final   Carbapenem resistance IMP NOT DETECTED NOT DETECTED Final   Carbapenem resistance KPC NOT DETECTED NOT DETECTED Final   Carbapenem resistance NDM NOT DETECTED NOT DETECTED Final   Carbapenem resist OXA 48 LIKE NOT DETECTED NOT DETECTED Final   Carbapenem resistance VIM NOT DETECTED NOT DETECTED Final    Comment: Performed at [facility], [address], [place], [place] [address]         Radiology Studies: No results found.      Scheduled Meds:  amLODipine  10 mg Oral Daily   calcium carbonate  1 tablet Oral Q breakfast   carvedilol  12.5 mg Oral BID   Chlorhexidine Gluconate Cloth  6 each Topical Daily   cholecalciferol  1,000 Units Oral Daily   cycloSPORINE  1 drop Both Eyes BID   enoxaparin (LOVENOX) injection  40 mg Subcutaneous Q24H   estradiol  2 mg Oral Daily   feeding supplement  1 Container Oral BID BM   hydrALAZINE  100 mg Oral TID   levETIRAcetam  500 mg Oral BID   lipase/protease/amylase  36,000 Units Oral TID WC   methadone  5 mg Oral 5 X Daily    methocarbamol  500 mg Oral BID   multivitamin-iron-minerals-folic acid   Oral Daily   pantoprazole  40 mg Oral Daily   sodium chloride flush  10-40 mL Intracatheter Q12H   sucralfate  1 g Oral BID   Teduglutide (rDNA)  1.5 mg Subcutaneous Daily   vitamin B-12  1,000 mcg Oral Daily   Continuous Infusions:  sodium  chloride 75 mL/hr at 10/10/21 0038   cefTRIAXone (ROCEPHIN)  IV 2 g (10/10/21 1131)     LOS: 2 days    Time spent: 39 minutes spent on chart review, discussion with nursing staff, consultants, updating family and interview/physical exam; more than 50% of that time was spent in counseling and/or coordination of care.    Sedra Morfin J [place] DO [facility] Available via Epic secure chat 7am-7pm After these hours, please refer to coverage provider listed on [email or web address] 10/10/2021, 1:29 PM

## 2021-10-10 NOTE — Progress Notes (Addendum)
PHARMACY CONSULT NOTE FOR:  OUTPATIENT  PARENTERAL ANTIBIOTIC THERAPY (OPAT)  Indication: Klebsiella PNA bacteremia associated with chronic PICC for TPN Regimen: Cefazolin 2g IV every 12 hours + Cefazolin 5 mg/ml + Heparin 2500 units/ml instilled in each lumen of double lumen catheter as lock therapy for at least 2 hours/daily End date: 10/21/21  Addendum: Acinetobacter is now also growing in 11/13 blood cultures. [name] antibiotic regimen and lock solution have been adjusted. New OPAT orders will be entered when a new plan is made for this patient pending sensitivities and repeat blood cultures  Alycia Rossetti, PharmD, BCPS 10/11/2021 11:28 AM    IV antibiotic discharge orders are pended. To discharging provider:  please sign these orders via discharge navigator,  Select New Orders & click on the button choice - Manage This Unsigned Work.     Thank you for allowing pharmacy to be a part of this patient's care.  Alycia Rossetti, PharmD, BCPS Infectious Diseases Clinical Pharmacist 10/10/2021 3:12 PM   **Pharmacist phone directory can now be found on [email or web address] ([email or web address]).  Listed under [place].

## 2021-10-10 NOTE — Progress Notes (Signed)
Initial Nutrition Assessment  DOCUMENTATION CODES:   Non-severe (moderate) malnutrition in context of chronic illness  INTERVENTION:  - will order Boost Breeze BID, each supplement provides 250 kcal and 9 grams of protein. - will monitor for ability to resume home TPN.   NUTRITION DIAGNOSIS:   Moderate Malnutrition related to chronic illness (short bowel syndrome) as evidenced by mild fat depletion, mild muscle depletion, moderate muscle depletion.  GOAL:   Patient will meet greater than or equal to 90% of their needs  MONITOR:   PO intake, Supplement acceptance, Labs, Weight trends, Other (Comment) (ability to resume TPN)  REASON FOR ASSESSMENT:   Consult Malnutrition Eval  ASSESSMENT:   60 year old female with medical history of Crohn's disease, short bowel syndrome, severe protein calorie malnutrition on TPN since 2003 via R thigh PICC, essential HTN, GERD, anxiety, depression, and seizure disorder. She presented to the ED via EMS on 11/12 due to fever, cramp-like abdominal pain, and throbbing lower back pain. She reported a fever of 102 F at home and took Tylenol. CT abdomen/pelvis with contrast showed multifocal cervical changes abdomen/pelvis with few distended small bowel loops similar in appearance to prior exam which could represent patulous bowel versus partial/early SBO, large amount of stool residual colon/rectum and anasarca. Chest x-ray with no acute cardiopulmonary disease process.  Patient is well known to this RD from previous hospitalizations and was most recently seen by this RD on 09/27/21.  She is not yet receiving home TPN regimen this admission d/t bacteremia. She is on a Regular diet and was able to eat 100% of breakfast this AM without issue. She continues to eat some at home as able, which is her baseline.    Weight on 11/13 was 149 lb which is highest weight in the past 4 months. Noted to have mild pitting edema to BLE during NFPE. She is noted to be  +525 ml since admission.   Labs reviewed; CBGs: 117 and 114 mg/dl, K: 3.2 mmol/l, creatinine: 1.47 mg/dl, Ca: 8.5 mg/dl, Alk Phos elevated but trending down, ALT elevated but trending down, GFR: 41 ml/min.  Medications reviewed; 1 tablet os-cal/day, 1000 units cholecalciferol/day, 36000 units creon TID, 1 tablet centrum/day, 40 mg oral protonix/day, 40 mEq Klor-Con x2 doses 11/15, 1 g carafate BID, 1000 mcg oral cyanocobalamin/day.   IVF; NS @ 75 ml/hr.     NUTRITION - FOCUSED PHYSICAL EXAM:  Flowsheet Row Most Recent Value  Orbital Region Mild depletion  Upper Arm Region Mild depletion  Thoracic and Lumbar Region Unable to assess  Buccal Region No depletion  Temple Region No depletion  Clavicle Bone Region Mild depletion  Clavicle and Acromion Bone Region Mild depletion  Scapular Bone Region No depletion  Dorsal Hand No depletion  Patellar Region Moderate depletion  Anterior Thigh Region Moderate depletion  Posterior Calf Region Mild depletion  Edema (RD Assessment) Mild  [BLE]  Hair Reviewed  Eyes Reviewed  Mouth Reviewed  Skin Reviewed  Nails Reviewed       Diet Order:   Diet Order             Diet regular Room service appropriate? Yes; Fluid consistency: Thin  Diet effective now                   EDUCATION NEEDS:   No education needs have been identified at this time  Skin:  Skin Assessment: Reviewed RN Assessment  Last BM:  11/13 (type 1)  Height:   Ht Readings from Last  1 Encounters:  10/08/21 _0  (1.727 m)    Weight:   Wt Readings from Last 1 Encounters:  10/08/21 67.8 kg    Estimated Nutritional Needs:  Kcal:  1900-2100 kcal Protein:  90-105 grams Fluid:  >/= 2 L/day     Jarome Matin, MS, RD, LDN, CNSC Inpatient Clinical Dietitian RD pager # available in AMION  After hours/weekend pager # available in [facility]

## 2021-10-11 DIAGNOSIS — Z789 Other specified health status: Secondary | ICD-10-CM | POA: Diagnosis not present

## 2021-10-11 DIAGNOSIS — T80211D Bloodstream infection due to central venous catheter, subsequent encounter: Secondary | ICD-10-CM | POA: Diagnosis not present

## 2021-10-11 DIAGNOSIS — A498 Other bacterial infections of unspecified site: Secondary | ICD-10-CM | POA: Diagnosis not present

## 2021-10-11 DIAGNOSIS — R7881 Bacteremia: Secondary | ICD-10-CM | POA: Diagnosis not present

## 2021-10-11 LAB — COMPREHENSIVE METABOLIC PANEL
ALT: 114 U/L — ABNORMAL HIGH (ref 0–44)
AST: 23 U/L (ref 15–41)
Albumin: 2.5 g/dL — ABNORMAL LOW (ref 3.5–5.0)
Alkaline Phosphatase: 124 U/L (ref 38–126)
Anion gap: 7 (ref 5–15)
BUN: 13 mg/dL (ref 6–20)
CO2: 24 mmol/L (ref 22–32)
Calcium: 8.8 mg/dL — ABNORMAL LOW (ref 8.9–10.3)
Chloride: 107 mmol/L (ref 98–111)
Creatinine, Ser: 1.29 mg/dL — ABNORMAL HIGH (ref 0.44–1.00)
GFR, Estimated: 48 mL/min — ABNORMAL LOW (ref >60)
Glucose, Bld: 95 mg/dL (ref 70–99)
Potassium: 3.8 mmol/L (ref 3.5–5.1)
Sodium: 138 mmol/L (ref 135–145)
Total Bilirubin: 0.7 mg/dL (ref 0.3–1.2)
Total Protein: 5.8 g/dL — ABNORMAL LOW (ref 6.5–8.1)

## 2021-10-11 LAB — MAGNESIUM: Magnesium: 1.9 mg/dL (ref 1.7–2.4)

## 2021-10-11 LAB — CULTURE, BLOOD (ROUTINE X 2): Special Requests: ADEQUATE

## 2021-10-11 MED ORDER — LEVOFLOXACIN IN D5W 750 MG/150ML IV SOLN
750.0000 mg | Freq: Once | INTRAVENOUS | Status: AC
  Administered 2021-10-11: 750 mg via INTRAVENOUS
  Filled 2021-10-11: qty 150

## 2021-10-11 MED ORDER — CIPROFLOXACIN 0.2 MG/ML + HEPARIN 5000 UNITS/ML ABX LOCK SOLN
2.5000 mL | Freq: Every day | Status: DC
  Filled 2021-10-11: qty 2.5

## 2021-10-11 MED ORDER — CIPROFLOXACIN 0.2 MG/ML + HEPARIN 5000 UNITS/ML ABX LOCK SOLN
2.5000 mL | Freq: Every day | Status: DC
Start: 2021-10-11 — End: 2021-10-11

## 2021-10-11 MED ORDER — CIPROFLOXACIN 0.2 MG/ML + HEPARIN 5000 UNITS/ML ABX LOCK SOLN
2.5000 mL | Freq: Every day | Status: DC
  Administered 2021-10-11 – 2021-10-16 (×6): 2.5 mL
  Filled 2021-10-11 (×6): qty 2.5

## 2021-10-11 MED ORDER — LEVOFLOXACIN IN D5W 500 MG/100ML IV SOLN
500.0000 mg | INTRAVENOUS | Status: DC
  Administered 2021-10-12 – 2021-10-16 (×5): 500 mg via INTRAVENOUS
  Filled 2021-10-11 (×5): qty 100

## 2021-10-11 MED ORDER — CIPROFLOXACIN 0.2 MG/ML + HEPARIN 5000 UNITS/ML ABX LOCK SOLN
2.5000 mL | Freq: Every day | Status: DC
  Administered 2021-10-11 – 2021-10-16 (×7): 2.5 mL
  Filled 2021-10-11 (×5): qty 2.5
  Filled 2021-10-11: qty 2
  Filled 2021-10-11 (×2): qty 2.5
  Filled 2021-10-11: qty 2

## 2021-10-11 MED ORDER — HYDROMORPHONE HCL 2 MG PO TABS
4.0000 mg | ORAL_TABLET | Freq: Four times a day (QID) | ORAL | Status: DC | PRN
  Administered 2021-10-11 – 2021-10-16 (×18): 4 mg via ORAL
  Filled 2021-10-11 (×20): qty 2

## 2021-10-11 NOTE — Progress Notes (Signed)
PROGRESS NOTE    Caitlin Peters  OBS:[id number] DOB: [birth year] DOA: 10/07/2021 PCP: [name], [name], MD    Brief Narrative:  Caitlin Peters is a 60 year old female with past medical history significant for Crohn's disease, short bowel syndrome, severe protein calorie malnutrition on TPN since 2003 via right thigh PICC line, essential hypertension, GERD, anxiety/depression, seizure disorder who presents to [facility] ED on 11/12 with fever.  Patient reports day prior, returned from a walk and started having crampy abdominal pain and throbbing in her low back.  She noted she had a fever of 102.0 F at home and took Tylenol.  Due to concern for recurrent history of infections, she called EMS and was brought to the ED for further evaluation.  Assessment & Plan:   Klebsiella pneumonia septicemia, POA Concurrent Acinetobacter infection, POA - Recurrent history of CLABSI 2/2 chronic PICC line use for TPN.   - Repeat cultures here initially remarkable for Kelbsiella, now showing acinetobacter as well - ID following - continue PICC lock w/ abx - DC ALL superfluous IV meds/fluids to properly treat line infection per ID/pharmacy discussion   Elevated LFTs, resolving - Possibly secondary to disseminated infection - PRN tylenol use at home but seems appropriate dose/schedule -AST/ALT/Bili downtrending appropriately  Abdominal pain, resolved Patulous bowel vs early/partial small bowel obstruction - CT abdomen/pelvis: few distended small bowel loops similar in appearance to partial/early SBO - no difficulty with BM per patient today  Hypokalemia Continue to replete in TPN as necessary given short gut syndrome  Essential hypertension - Continue home carvedilol/amlodipine/hydralazine as scheduled  Seizure Disorder: Keppra 500 mg p.o. twice daily  Short-bowel syndrome - Teduglutide 1.41m [place] daily - Strict I's and O's  Pancreatic insufficiency: - Creon 36,000 units 3 times  daily  Chronic pain syndrome:  --Methadone 5 mg 5x daily --Robaxin 5 1 mg p.o. twice daily --Dilaudid transitioned back to PO route given need to limit PICC access due to ongoing infection and continued abx lock  GERD:  --Protonix 40 mg p.o. daily --Carafate 1 g p.o. twice daily --Famotidine 20 mg p.o. daily as needed heartburn/indigestion  CKD stage IIIb - Creatinine near baseline and stable - avoid IV fluids unless able to bolus prior to abx lock per discussion with ID/Pharmacy  Chronic normocytic/iron deficiency anemia Baseline hemoglobin 7-9 over the past few months.  Hemoglobin 8.4, stable.  Chronic thrombocytopenia Etiology likely secondary to chronic illness/malnutrition.  Platelet count stable.  Moderate protein calorie malnutrition Etiology likely secondary to underlying Crohn's disease with history of short bowel syndrome.  Has been on TPN since 2003. Body mass index is 22.73 kg/m. Nutrition Status: Nutrition Problem: Moderate Malnutrition Etiology: chronic illness (short bowel syndrome) Signs/Symptoms: mild fat depletion, mild muscle depletion, moderate muscle depletion Interventions: Boost Breeze, MVI --Holding TPN for now given septicemia as above --Regular diet --Dietitian following   DVT prophylaxis: enoxaparin (LOVENOX) injection 40 mg Start: 10/08/21 1800   Code Status: Full Code Family Communication: No family present at bedside this morning  Disposition Plan:  Level of care: Med-Surg Status is: Inpatient  Remains inpatient appropriate because: IV antibiotics, awaiting blood culture susceptibilities and repeat blood cultures, anticipate discharge 2-3 days with likely need of home IV antibiotics    Consultants:  Infectious disease, Dr. VGale Journey Procedures:  None  Antimicrobials:  Vancomycin 11/13 - 11/14 Meropenem 11/13 - 11/14 Ceftriaxone 11/14 - 11/16 Levaquin/Cipro 11/16 >> ongoing   Subjective: No acute issues or events overnight, tolerating  PO well, abdominal pain  improving - unfortunately late morning cultures were noted to be positive again for acinetobacter. Clinically patient feels well and denies headache, fevers, chills, nausea, vomiting chestpain or shortness of breath  Objective: Vitals:   10/10/21 0528 10/10/21 1403 10/10/21 2213 10/11/21 0538  BP: (!) 165/72 (!) 187/73 (!) 160/57 (!) 175/84  Pulse: 63 (!) 58 68 70  Resp: 16 19 16 16   Temp: 98.4 F (36.9 C) 98.4 F (36.9 C) 98.4 F (36.9 C) 98.6 F (37 C)  TempSrc: Oral  Oral Oral  SpO2: 99% 100% 98% 98%  Weight:      Height:        Intake/Output Summary (Last 24 hours) at 10/11/2021 1257 Last data filed at 10/11/2021 1124 [place] per 24 hour  Intake 870 ml  Output --  Net 870 ml    Filed Weights   10/07/21 2327 10/08/21 0636  Weight: 70.3 kg 67.8 kg    Examination:  General exam: Appears calm and comfortable  Respiratory system: Clear to auscultation. Respiratory effort normal.  On room air Cardiovascular system: S1 & S2 heard, RRR. No JVD, murmurs, rubs, gallops or clicks. No pedal edema. Gastrointestinal system: Abdomen is nondistended, soft and nontender. No organomegaly or masses felt. Normal bowel sounds heard. Central nervous system: Alert and oriented. No focal neurological deficits. Extremities: Symmetric 5 x 5 power.,  Right thigh PICC line noted without erythema/fluctuance/purulance Skin: No rashes, lesions or ulcers Psychiatry: Judgement and insight appear normal. Mood & affect appropriate.     Data Reviewed: I have personally reviewed following labs and imaging studies  CBC: Recent Labs  Lab 10/08/21 0002 10/09/21 0842  WBC 7.3 3.8*  NEUTROABS 6.7  --   HGB 7.8* 8.4*  HCT 23.6* 26.3*  MCV 95.2 98.1  PLT 126* PLATELET CLUMPS NOTED ON SMEAR, UNABLE TO ESTIMATE    Basic Metabolic Panel: Recent Labs  Lab 10/08/21 0002 10/09/21 0842 10/10/21 0426 10/11/21 0428  NA 137 136 139 138  K 3.7 3.5 3.2* 3.8  CL 103 104 107 107   CO2 28 25 26 24   GLUCOSE 94 83 97 95  BUN 33* 20 16 13   CREATININE 1.68* 1.63* 1.47* 1.29*  CALCIUM 8.4* 8.4* 8.5* 8.8*  MG 1.9  --   --  1.9    GFR: Estimated Creatinine Clearance: 46.8 mL/min (A) (by C-G formula based on SCr of 1.29 mg/dL (H)). Liver Function Tests: Recent Labs  Lab 10/08/21 0002 10/09/21 0842 10/10/21 0426 10/11/21 0428  AST 267* 93* 37 23  ALT 221* 241* 154* 114*  ALKPHOS 156* 150* 128* 124  BILITOT 0.7 0.9 0.5 0.7  PROT 5.7* 5.7* 5.5* 5.8*  ALBUMIN 2.7* 2.6* 2.3* 2.5*    Recent Labs  Lab 10/08/21 0002  LIPASE 22    No results for input(s): AMMONIA in the last 168 hours. Coagulation Profile: No results for input(s): INR, PROTIME in the last 168 hours. Cardiac Enzymes: No results for input(s): CKTOTAL, CKMB, CKMBINDEX, TROPONINI in the last 168 hours. BNP (last 3 results) No results for input(s): PROBNP in the last 8760 hours. HbA1C: No results for input(s): HGBA1C in the last 72 hours. CBG: No results for input(s): GLUCAP in the last 168 hours. Lipid Profile: No results for input(s): CHOL, HDL, LDLCALC, TRIG, CHOLHDL, LDLDIRECT in the last 72 hours. Thyroid Function Tests: No results for input(s): TSH, T4TOTAL, FREET4, T3FREE, THYROIDAB in the last 72 hours. Anemia Panel: No results for input(s): VITAMINB12, FOLATE, FERRITIN, TIBC, IRON, RETICCTPCT in the last 72  hours. Sepsis Labs: Recent Labs  Lab 10/08/21 0002  LATICACIDVEN 1.1     Recent Results (from the past 240 hour(s))  Blood culture (routine x 2)     Status: Abnormal (Preliminary result)   Collection Time: 10/08/21 12:02 AM   Specimen: BLOOD  Result Value Ref Range Status   Specimen Description   Final    BLOOD RIGHT PICC LINE Performed at [facility], [place] [address] [place], [place] [address]    Special Requests   Final    BOTTLES DRAWN AEROBIC AND ANAEROBIC Blood Culture adequate volume Performed at [place]  [address], [place], [place] [address]    Culture  Setup Time   Final    GRAM NEGATIVE RODS AEROBIC BOTTLE ONLY CRITICAL VALUE NOTED.  VALUE IS CONSISTENT WITH PREVIOUSLY REPORTED AND CALLED VALUE.    Culture (A)  Final    ACINETOBACTER URSINGII SUSCEPTIBILITIES TO FOLLOW Performed at [facility], [place] [address] [place], [place] [address]    Report Status PENDING  Incomplete  Blood culture (routine x 2)     Status: Abnormal (Preliminary result)   Collection Time: 10/08/21 12:02 AM   Specimen: BLOOD  Result Value Ref Range Status   Specimen Description   Final    BLOOD RIGHT PORTA CATH Performed at [place] [address] [place], [place] [address]    Special Requests   Final    BOTTLES DRAWN AEROBIC AND ANAEROBIC Blood Culture adequate volume Performed at [place] [address], [place], [place] [address]    Culture  Setup Time   Final    GRAM NEGATIVE RODS IN BOTH AEROBIC AND ANAEROBIC BOTTLES CRITICAL RESULT CALLED TO, READ BACK BY AND VERIFIED WITH: DREW [facility] PHARMD @1808  10/08/21 EB Performed at [facility], [place] [address], [place], [place] [address]    Culture KLEBSIELLA PNEUMONIAE (A)  Final   Report Status PENDING  Incomplete   Organism ID, Bacteria KLEBSIELLA PNEUMONIAE  Final      Susceptibility   Klebsiella pneumoniae - MIC*    AMPICILLIN >=32 RESISTANT Resistant     CEFAZOLIN <=4 SENSITIVE Sensitive     CEFEPIME <=0.12 SENSITIVE Sensitive     CEFTAZIDIME <=1 SENSITIVE Sensitive     CEFTRIAXONE <=0.25 SENSITIVE Sensitive     CIPROFLOXACIN <=0.25 SENSITIVE Sensitive     GENTAMICIN <=1 SENSITIVE Sensitive     IMIPENEM <=0.25 SENSITIVE Sensitive     TRIMETH/SULFA <=20 SENSITIVE Sensitive     AMPICILLIN/SULBACTAM 4 SENSITIVE Sensitive     PIP/TAZO <=4 SENSITIVE Sensitive     * KLEBSIELLA PNEUMONIAE  Resp Panel by RT-PCR (Flu A&B, Covid) Nasopharyngeal Swab     Status: None   Collection Time:  10/08/21 12:02 AM   Specimen: Nasopharyngeal Swab; Nasopharyngeal(NP) swabs in vial transport medium  Result Value Ref Range Status   SARS Coronavirus 2 by RT PCR NEGATIVE NEGATIVE Final    Comment: (NOTE) SARS-CoV-2 target nucleic acids are NOT DETECTED.  The SARS-CoV-2 RNA is generally detectable in upper respiratory specimens during the acute phase of infection. The lowest concentration of SARS-CoV-2 viral copies this assay can detect is 138 copies/mL. A negative result does not preclude SARS-Cov-2 infection and should not be used as the sole basis for treatment or other patient management decisions. A negative result may occur with  improper specimen collection/handling, submission of specimen other than nasopharyngeal swab, presence of viral mutation(s) within the areas targeted  by this assay, and inadequate number of viral copies(<138 copies/mL). A negative result must be combined with clinical observations, patient history, and epidemiological information. The expected result is Negative.  Fact Sheet for Patients:  EntrepreneurPulse.com.au  Fact Sheet for Healthcare Providers:  IncredibleEmployment.be  This test is no t yet approved or cleared by the [place] FDA and  has been authorized for detection and/or diagnosis of SARS-CoV-2 by FDA under an Emergency Use Authorization (EUA). This EUA will remain  in effect (meaning this test can be used) for the duration of the COVID-19 declaration under Section 564(b)(1) of the Act, 21 U.S.C.section 360bbb-3(b)(1), unless the authorization is terminated  or revoked sooner.       Influenza A by PCR NEGATIVE NEGATIVE Final   Influenza B by PCR NEGATIVE NEGATIVE Final    Comment: (NOTE) The Xpert Xpress SARS-CoV-2/FLU/RSV plus assay is intended as an aid in the diagnosis of influenza from Nasopharyngeal swab specimens and should not be used as a sole basis for treatment. Nasal washings  and aspirates are unacceptable for Xpert Xpress SARS-CoV-2/FLU/RSV testing.  Fact Sheet for Patients: EntrepreneurPulse.com.au  Fact Sheet for Healthcare Providers: IncredibleEmployment.be  This test is not yet approved or cleared by the [place] FDA and has been authorized for detection and/or diagnosis of SARS-CoV-2 by FDA under an Emergency Use Authorization (EUA). This EUA will remain in effect (meaning this test can be used) for the duration of the COVID-19 declaration under Section 564(b)(1) of the Act, 21 U.S.C. section 360bbb-3(b)(1), unless the authorization is terminated or revoked.  Performed at [facility], [place] [address], [place], [place] [address]   Blood Culture ID Panel (Reflexed)     Status: Abnormal   Collection Time: 10/08/21 12:02 AM  Result Value Ref Range Status   Enterococcus faecalis NOT DETECTED NOT DETECTED Final   Enterococcus Faecium NOT DETECTED NOT DETECTED Final   Listeria monocytogenes NOT DETECTED NOT DETECTED Final   Staphylococcus species NOT DETECTED NOT DETECTED Final   Staphylococcus aureus (BCID) NOT DETECTED NOT DETECTED Final   Staphylococcus epidermidis NOT DETECTED NOT DETECTED Final   Staphylococcus lugdunensis NOT DETECTED NOT DETECTED Final   Streptococcus species NOT DETECTED NOT DETECTED Final   Streptococcus agalactiae NOT DETECTED NOT DETECTED Final   Streptococcus pneumoniae NOT DETECTED NOT DETECTED Final   Streptococcus pyogenes NOT DETECTED NOT DETECTED Final   A.calcoaceticus-baumannii NOT DETECTED NOT DETECTED Final   Bacteroides fragilis NOT DETECTED NOT DETECTED Final   Enterobacterales DETECTED (A) NOT DETECTED Final    Comment: Enterobacterales represent a large order of gram negative bacteria, not a single organism. CRITICAL RESULT CALLED TO, READ BACK BY AND VERIFIED WITH: DREW WOFFORD PHARMD @1808  10/08/21 EB    Enterobacter cloacae complex NOT DETECTED  NOT DETECTED Final   Escherichia coli NOT DETECTED NOT DETECTED Final   Klebsiella aerogenes NOT DETECTED NOT DETECTED Final   Klebsiella oxytoca NOT DETECTED NOT DETECTED Final   Klebsiella pneumoniae DETECTED (A) NOT DETECTED Final    Comment: CRITICAL RESULT CALLED TO, READ BACK BY AND VERIFIED WITH: DREW WOFFORD PHARMD @1808  10/08/21 EB    Proteus species NOT DETECTED NOT DETECTED Final   Salmonella species NOT DETECTED NOT DETECTED Final   Serratia marcescens NOT DETECTED NOT DETECTED Final   Haemophilus influenzae NOT DETECTED NOT DETECTED Final   Neisseria meningitidis NOT DETECTED NOT DETECTED Final   Pseudomonas aeruginosa NOT DETECTED NOT DETECTED Final   Stenotrophomonas maltophilia NOT DETECTED NOT DETECTED Final   Candida  albicans NOT DETECTED NOT DETECTED Final   Candida auris NOT DETECTED NOT DETECTED Final   Candida glabrata NOT DETECTED NOT DETECTED Final   Candida krusei NOT DETECTED NOT DETECTED Final   Candida parapsilosis NOT DETECTED NOT DETECTED Final   Candida tropicalis NOT DETECTED NOT DETECTED Final   Cryptococcus neoformans/gattii NOT DETECTED NOT DETECTED Final   CTX-M ESBL NOT DETECTED NOT DETECTED Final   Carbapenem resistance IMP NOT DETECTED NOT DETECTED Final   Carbapenem resistance KPC NOT DETECTED NOT DETECTED Final   Carbapenem resistance NDM NOT DETECTED NOT DETECTED Final   Carbapenem resist OXA 48 LIKE NOT DETECTED NOT DETECTED Final   Carbapenem resistance VIM NOT DETECTED NOT DETECTED Final    Comment: Performed at [facility], [address], [place], [place] [address]  Culture, blood (routine x 2)     Status: None (Preliminary result)   Collection Time: 10/09/21 11:33 AM   Specimen: BLOOD RIGHT HAND  Result Value Ref Range Status   Specimen Description   Final    BLOOD RIGHT HAND Performed at [place] [address] [place], [place] [address]    Special Requests   Final    BOTTLES DRAWN AEROBIC ONLY  Blood Culture results may not be optimal due to an inadequate volume of blood received in culture bottles Performed at [place] [address] [place], [place] [address]    Culture   Final    NO GROWTH 2 DAYS Performed at [place] [address] [place], [place] [address]    Report Status PENDING  Incomplete  Culture, blood (routine x 2)     Status: None (Preliminary result)   Collection Time: 10/09/21 12:24 PM   Specimen: BLOOD  Result Value Ref Range Status   Specimen Description   Final    BLOOD PICC LINE Performed at [facility], [place] [address], [place], [place] [address]    Special Requests   Final    BOTTLES DRAWN AEROBIC AND ANAEROBIC Blood Culture results may not be optimal due to an inadequate volume of blood received in culture bottles Performed at [place] [address], [place], [place] [address]    Culture   Final    NO GROWTH 2 DAYS Performed at [facility] [address], [place], [place] [address]    Report Status PENDING  Incomplete          Radiology Studies: No results found.      Scheduled Meds:  amLODipine  10 mg Oral Daily   calcium carbonate  1 tablet Oral Q breakfast   carvedilol  12.5 mg Oral BID   Chlorhexidine Gluconate Cloth  6 each Topical Daily   cholecalciferol  1,000 Units Oral Daily   ciprofloxacin 0.2 mg/ml + heparin 5000 units/ml  2.5 mL Intracatheter Q1200   ciprofloxacin 0.2 mg/ml + heparin 5000 units/ml  2.5 mL Intracatheter Q1200   cycloSPORINE  1 drop Both Eyes BID   enoxaparin (LOVENOX) injection  40 mg Subcutaneous Q24H   estradiol  2 mg Oral Daily   feeding supplement  1 Container Oral BID BM   hydrALAZINE  100 mg Oral TID   levETIRAcetam  500 mg Oral BID   lipase/protease/amylase  36,000 Units Oral TID WC   methadone  5 mg Oral 5 X Daily   methocarbamol  500 mg Oral BID   multivitamin-iron-minerals-folic acid   Oral Daily  pantoprazole  40 mg Oral Daily   sucralfate  1 g Oral BID   Teduglutide (rDNA)  1.5 mg Subcutaneous Daily   vitamin B-12  1,000 mcg Oral Daily   Continuous Infusions:  [START ON 10/12/2021] levofloxacin (LEVAQUIN) IV       LOS: 3 days    Time spent: 39 minutes spent on chart review, discussion with nursing staff, consultants, updating family and interview/physical exam; more than 50% of that time was spent in counseling and/or coordination of care.    Little Ishikawa, DO [facility] Available via Epic secure chat 7am-7pm After these hours, please refer to coverage provider listed on [email or web address] 10/11/2021, 12:57 PM

## 2021-10-11 NOTE — Progress Notes (Signed)
Pharmacy Antibiotic Note  Caitlin Peters is a 60 y.o. female admitted on 10/07/2021 with PICC line infection/bacteremia - initial cultures growing Klebsiella PNA and now with recurrent Acinetobacter ursingii. Pharmacy has been consulted for Levaquin dosing.  Prior sensitivities from 10/31 blood cultures show sensitivity to Ciprofloxacin - so will utilize Levaquin empirically for now. [name] SCr fluctuates but normally appears to fall between 1.4-1.6, normalized CrCl~40-50 ml/min but will intentionally use slightly higher dosing to target treatment for Acinetobacter.   Plan: - Levaquin 750 mg IV every 24 hours followed by 500 mg IV every 24 hours - Will adjust antibiotic lock solutions to Cipro 0.2 mg/ml + heparin 5000 units/ml - to instill in each port for a minimum of 2 hours daily when PICC not in use - Will continue to follow renal function, culture results, LOT, and antibiotic de-escalation plans   Height: 5' 8"  (172.7 cm) Weight: 67.8 kg (149 lb 7.6 oz) IBW/kg (Calculated) : 63.9  Temp (24hrs), Avg:98.5 F (36.9 C), Min:98.4 F (36.9 C), Max:98.6 F (37 C)  Recent Labs  Lab 10/08/21 0002 10/09/21 0842 10/10/21 0426 10/11/21 0428  WBC 7.3 3.8*  --   --   CREATININE 1.68* 1.63* 1.47* 1.29*  LATICACIDVEN 1.1  --   --   --     Estimated Creatinine Clearance: 46.8 mL/min (A) (by C-G formula based on SCr of 1.29 mg/dL (H)).    Allergies  Allergen Reactions   Meperidine Hives    Other reaction(s): GI Upset Due to [place]    Hyoscyamine Hives and Swelling    Legs swelling Disorientation   Cefepime Other (See Comments)    Neurotoxicity occurring in setting of AKI. Ceftriaxone tolerated during same admit   Gabapentin Other (See Comments)    unknown   Lyrica [Pregabalin] Other (See Comments)    Has chronic dehydration and made it worse   Topamax [Topiramate] Other (See Comments)    Has chronic dehydration and made it worse   Zosyn [Piperacillin Sod-Tazobactam So]  Other (See Comments)    Patient reports it makes her vomit, her neck stiff, and her "heart feel funny" Affected kidneys   Fentanyl Rash    Pt is allergic to fentanyl patch related to the glue (gives her a rash) Pt states she is NOT allergic to fentanyl IV medicine   Morphine And Related Rash    Antimicrobials this admission: Vanc 11/13 >> 11/4 Meropenem 11/13 x 1 CTX 11/14 >> Cefazolin 11/16 x 1 Levaquin 11/16 >>  Dose adjustments this admission:   Microbiology results: 11/13 COVID/flu >> neg 11/13 BCx >> 2/4 K PNA (pan-S except R-amp), 1/4 Acinetobacter Ursingii 11/14 BCx >> ngx2d  Thank you for allowing pharmacy to be a part of this patient's care.  Alycia Rossetti, PharmD, BCPS Infectious Disease Clinical Pharmacist 10/11/2021 12:08 PM   **Pharmacist phone directory can now be found on [email or web address] ([email or web address]).  Listed under [place].

## 2021-10-11 NOTE — Progress Notes (Signed)
Diagnosis: Clabsi -- kleb pna (S cefazolin) Tpn/short gut syndrome Chronic right femoral central line  Has had recurrent clabsi and difficulty replacing central line Poor line access upper extremities (not possible)  11/13 admission bcx kleb pna, and as of today 11/16 growing acinetobacter as well Repeat bcx 11/14 still no growth Will adjust abx to levofloxacin (per prior sensitivity for acinetobacter), and also lock therapy for same.  Will repeat bcx 11/17 -- if that is negative she can discharge on 2 weeks therapy; if positive will remove line and get TEE  Discusssed with patient high risk of failure/recurrent clabsi still, despite currently maximal pharmacotherapy   OPAT Orders Discharge antibiotics to be given via PICC line Discharge antibiotics:  Levofloxacin 750 mg iv daily Levofloxacin abx lock therapy   Duration: 2 weeks, starting 11/17 (if bcx on that date remains negative) End Date: 12/01  [facility] Care Per Protocol:  Home health RN for IV administration and teaching; PICC line care and labs.    Labs weekly while on IV antibiotics: _x_ CBC with differential __ BMP _x_ CMP __ CRP __ ESR __ Vancomycin trough __ CK  __ Please pull PIC at completion of IV antibiotics _x_ Please leave PIC in place u   Fax weekly labs to [phone]   --------- Subjective: Doing well No diarrhea No f/c No rash No back pain Admission bcx now growing acinetobacter as well 11/14 repeat bcx ngtd   Objectives: Vitals:   10/10/21 2213 10/11/21 0538  BP: (!) 160/57 (!) 175/84  Pulse: 68 70  Resp: 16 16  Temp: 98.4 F (36.9 C) 98.6 F (37 C)  SpO2: 98% 98%   General/constitutional: no distress, pleasant HEENT: Normocephalic, PER, Conj Clear, EOMI, Oropharynx clear Neck supple CV: rrr no mrg Lungs: clear to auscultation, normal respiratory effort Abd: Soft, Nontender Ext: no edema Skin: No Rash Neuro: nonfocal MSK: no peripheral joint swelling/tenderness/warmth;  back spines nontender    Central line presence: right femoral picc site no erythema/purulence   Labs: Lab Results  Component Value Date   WBC 3.8 (L) 10/09/2021   HGB 8.4 (L) 10/09/2021   HCT 26.3 (L) 10/09/2021   MCV 98.1 10/09/2021   PLT PLATELET CLUMPS NOTED ON SMEAR, UNABLE TO ESTIMATE 34/19/3790   Last metabolic panel Lab Results  Component Value Date   GLUCOSE 95 10/11/2021   NA 138 10/11/2021   K 3.8 10/11/2021   CL 107 10/11/2021   CO2 24 10/11/2021   BUN 13 10/11/2021   CREATININE 1.29 (H) 10/11/2021   GFRNONAA 48 (L) 10/11/2021   CALCIUM 8.8 (L) 10/11/2021   PHOS 3.7 09/29/2021   PROT 5.8 (L) 10/11/2021   ALBUMIN 2.5 (L) 10/11/2021   BILITOT 0.7 10/11/2021   ALKPHOS 124 10/11/2021   AST 23 10/11/2021   ALT 114 (H) 10/11/2021   ANIONGAP 7 10/11/2021

## 2021-10-12 LAB — CBC
HCT: 26.2 % — ABNORMAL LOW (ref 36.0–46.0)
Hemoglobin: 9 g/dL — ABNORMAL LOW (ref 12.0–15.0)
MCH: 31.9 pg (ref 26.0–34.0)
MCHC: 34.4 g/dL (ref 30.0–36.0)
MCV: 92.9 fL (ref 80.0–100.0)
Platelets: 164 10*3/uL (ref 150–400)
RBC: 2.82 MIL/uL — ABNORMAL LOW (ref 3.87–5.11)
RDW: 14.6 % (ref 11.5–15.5)
WBC: 3.4 10*3/uL — ABNORMAL LOW (ref 4.0–10.5)
nRBC: 0 % (ref 0.0–0.2)

## 2021-10-12 LAB — BASIC METABOLIC PANEL
Anion gap: 7 (ref 5–15)
BUN: 11 mg/dL (ref 6–20)
CO2: 26 mmol/L (ref 22–32)
Calcium: 9.1 mg/dL (ref 8.9–10.3)
Chloride: 105 mmol/L (ref 98–111)
Creatinine, Ser: 1.44 mg/dL — ABNORMAL HIGH (ref 0.44–1.00)
GFR, Estimated: 42 mL/min — ABNORMAL LOW (ref >60)
Glucose, Bld: 106 mg/dL — ABNORMAL HIGH (ref 70–99)
Potassium: 3.4 mmol/L — ABNORMAL LOW (ref 3.5–5.1)
Sodium: 138 mmol/L (ref 135–145)

## 2021-10-12 NOTE — Plan of Care (Signed)

## 2021-10-12 NOTE — Progress Notes (Signed)
PROGRESS NOTE    Caitlin Peters  YNW:[id number] DOB: [birth year] DOA: 10/07/2021 PCP: [name], [name], MD    Brief Narrative:  Caitlin Peters is a 60 year old female with past medical history significant for Crohn's disease, short bowel syndrome, severe protein calorie malnutrition on TPN since 2003 via right thigh PICC line, essential hypertension, GERD, anxiety/depression, seizure disorder who presents to [facility] ED on 11/12 with fever.  Patient reports day prior, returned from a walk and started having crampy abdominal pain and throbbing in her low back.  She noted she had a fever of 102.0 F at home and took Tylenol.  Due to concern for recurrent history of infections, she called EMS and was brought to the ED for further evaluation.  Assessment & Plan:   Klebsiella pneumonia septicemia, POA Concurrent Acinetobacter infection, POA - Recurrent history of CLABSI 2/2 chronic PICC line use for TPN.   - Repeat cultures here initially remarkable for Kelbsiella, now showing acinetobacter as well - ID following - continue PICC lock w/ abx - Continue to hold all superfluous IV meds/fluids to properly treat line infection per ID/pharmacy discussion  Elevated LFTs, resolving - Possibly secondary to disseminated infection - PRN tylenol use at home but seems appropriate dose/schedule -AST/ALT/Bili downtrending appropriately  Abdominal pain, resolved Patulous bowel vs early/partial small bowel obstruction - CT abdomen/pelvis: few distended small bowel loops similar in appearance to partial/early SBO - no difficulty with BM per patient today  Hypokalemia Continue to replete in TPN as necessary given short gut syndrome  Essential hypertension - Continue home carvedilol/amlodipine/hydralazine as scheduled  Seizure Disorder: Keppra 500 mg p.o. twice daily  Short-bowel syndrome - Teduglutide 1.28m [place] daily - Strict I's and O's  Pancreatic insufficiency: - Creon 36,000 units 3 times  daily  Chronic pain syndrome:  --Methadone 5 mg 5x daily --Robaxin 5 1 mg p.o. twice daily --Dilaudid transitioned back to PO route given need to limit PICC access due to ongoing infection and continued abx lock  GERD:  --Protonix 40 mg p.o. daily --Carafate 1 g p.o. twice daily --Famotidine 20 mg p.o. daily as needed heartburn/indigestion  CKD stage IIIb - Creatinine near baseline and stable - avoid IV fluids unless able to bolus prior to abx lock per discussion with ID/Pharmacy  Chronic normocytic/iron deficiency anemia Baseline hemoglobin 7-9 over the past few months.  Hemoglobin 8.4, stable.  Chronic thrombocytopenia Etiology likely secondary to chronic illness/malnutrition.  Platelet count stable.  Moderate protein calorie malnutrition Etiology likely secondary to underlying Crohn's disease with history of short bowel syndrome.  Has been on TPN since 2003. Body mass index is 22.73 kg/m. Nutrition Status: Nutrition Problem: Moderate Malnutrition Etiology: chronic illness (short bowel syndrome) Signs/Symptoms: mild fat depletion, mild muscle depletion, moderate muscle depletion Interventions: Boost Breeze, MVI --Holding TPN for now given septicemia as above --Regular diet --Dietitian following   DVT prophylaxis: enoxaparin (LOVENOX) injection 40 mg Start: 10/08/21 1800   Code Status: Full Code Family Communication: No family present at bedside this morning  Disposition Plan:  Level of care: Med-Surg Status is: Inpatient  Remains inpatient appropriate because: IV antibiotics, awaiting blood culture susceptibilities and repeat blood cultures, anticipate discharge 2-3 days    Consultants:  Infectious disease, Dr. VGale Journey Procedures:  None  Antimicrobials:  Vancomycin 11/13 - 11/14 Meropenem 11/13 - 11/14 Ceftriaxone 11/14 - 11/16 Levaquin/Cipro 11/16 >> ongoing   Subjective: No acute issues or events overnight, tolerating PO well -denies headache fever chills  nausea vomiting diarrhea constipation  chest pain shortness of breath  Objective: Vitals:   10/11/21 2212 10/12/21 0523 10/12/21 0945 10/12/21 1022  BP: (!) 163/77 (!) 176/82 (!) 184/88 (!) 194/74  Pulse: 72 72 75 73  Resp: 16 16 16 15   Temp: 98 F (36.7 C) 98.5 F (36.9 C) 98.5 F (36.9 C) 98.8 F (37.1 C)  TempSrc: Oral Oral Oral Oral  SpO2: 99% 98% 98% 100%  Weight:      Height:        Intake/Output Summary (Last 24 hours) at 10/12/2021 1303 Last data filed at 10/12/2021 1200 Gross per 24 hour  Intake 1680 ml  Output --  Net 1680 ml    Filed Weights   10/07/21 2327 10/08/21 0636  Weight: 70.3 kg 67.8 kg    Examination:  General exam: Appears calm and comfortable  Respiratory system: Clear to auscultation. Respiratory effort normal.  On room air Cardiovascular system: S1 & S2 heard, RRR. No JVD, murmurs, rubs, gallops or clicks. No pedal edema. Gastrointestinal system: Abdomen is nondistended, soft and nontender. No organomegaly or masses felt. Normal bowel sounds heard. Central nervous system: Alert and oriented. No focal neurological deficits. Extremities: Symmetric 5 x 5 power.,  Right thigh PICC line noted without erythema/fluctuance/purulance Skin: No rashes, lesions or ulcers Psychiatry: Judgement and insight appear normal. Mood & affect appropriate.   Data Reviewed: I have personally reviewed following labs and imaging studies  CBC: Recent Labs  Lab 10/08/21 0002 10/09/21 0842 10/12/21 1113  WBC 7.3 3.8* 3.4*  NEUTROABS 6.7  --   --   HGB 7.8* 8.4* 9.0*  HCT 23.6* 26.3* 26.2*  MCV 95.2 98.1 92.9  PLT 126* PLATELET CLUMPS NOTED ON SMEAR, UNABLE TO ESTIMATE 510    Basic Metabolic Panel: Recent Labs  Lab 10/08/21 0002 10/09/21 0842 10/10/21 0426 10/11/21 0428 10/12/21 1113  NA 137 136 139 138 138  K 3.7 3.5 3.2* 3.8 3.4*  CL 103 104 107 107 105  CO2 28 25 26 24 26   GLUCOSE 94 83 97 95 106*  BUN 33* 20 16 13 11   CREATININE 1.68* 1.63* 1.47*  1.29* 1.44*  CALCIUM 8.4* 8.4* 8.5* 8.8* 9.1  MG 1.9  --   --  1.9  --     GFR: Estimated Creatinine Clearance: 41.9 mL/min (A) (by C-G formula based on SCr of 1.44 mg/dL (H)). Liver Function Tests: Recent Labs  Lab 10/08/21 0002 10/09/21 0842 10/10/21 0426 10/11/21 0428  AST 267* 93* 37 23  ALT 221* 241* 154* 114*  ALKPHOS 156* 150* 128* 124  BILITOT 0.7 0.9 0.5 0.7  PROT 5.7* 5.7* 5.5* 5.8*  ALBUMIN 2.7* 2.6* 2.3* 2.5*    Recent Labs  Lab 10/08/21 0002  LIPASE 22    No results for input(s): AMMONIA in the last 168 hours. Coagulation Profile: No results for input(s): INR, PROTIME in the last 168 hours. Cardiac Enzymes: No results for input(s): CKTOTAL, CKMB, CKMBINDEX, TROPONINI in the last 168 hours. BNP (last 3 results) No results for input(s): PROBNP in the last 8760 hours. HbA1C: No results for input(s): HGBA1C in the last 72 hours. CBG: No results for input(s): GLUCAP in the last 168 hours. Lipid Profile: No results for input(s): CHOL, HDL, LDLCALC, TRIG, CHOLHDL, LDLDIRECT in the last 72 hours. Thyroid Function Tests: No results for input(s): TSH, T4TOTAL, FREET4, T3FREE, THYROIDAB in the last 72 hours. Anemia Panel: No results for input(s): VITAMINB12, FOLATE, FERRITIN, TIBC, IRON, RETICCTPCT in the last 72 hours. Sepsis Labs: Recent Labs  Lab 10/08/21 0002  LATICACIDVEN 1.1     Recent Results (from the past 240 hour(s))  Blood culture (routine x 2)     Status: Abnormal (Preliminary result)   Collection Time: 10/08/21 12:02 AM   Specimen: BLOOD  Result Value Ref Range Status   Specimen Description   Final    BLOOD RIGHT PICC LINE Performed at [facility], [place] [address], [place], [place] [address]    Special Requests   Final    BOTTLES DRAWN AEROBIC AND ANAEROBIC Blood Culture adequate volume Performed at [place] [address] [place], [place] [address]    Culture  Setup Time   Final    GRAM  NEGATIVE RODS AEROBIC BOTTLE ONLY CRITICAL VALUE NOTED.  VALUE IS CONSISTENT WITH PREVIOUSLY REPORTED AND CALLED VALUE.    Culture (A)  Final    ACINETOBACTER URSINGII SUSCEPTIBILITIES TO FOLLOW Performed at [facility], [place] [address] [place], [place] [address]    Report Status PENDING  Incomplete  Blood culture (routine x 2)     Status: Abnormal   Collection Time: 10/08/21 12:02 AM   Specimen: BLOOD  Result Value Ref Range Status   Specimen Description   Final    BLOOD RIGHT PORTA CATH Performed at [place] [address] [place], [place] [address]    Special Requests   Final    BOTTLES DRAWN AEROBIC AND ANAEROBIC Blood Culture adequate volume Performed at [place] [address], [place], [place] [address]    Culture  Setup Time   Final    GRAM NEGATIVE RODS IN BOTH AEROBIC AND ANAEROBIC BOTTLES CRITICAL RESULT CALLED TO, READ BACK BY AND VERIFIED WITH: Reuel Boom PHARMD @1808  10/08/21 EB Performed at [facility], [place] [address], [place], [place] [address]    Culture KLEBSIELLA PNEUMONIAE (A)  Final   Report Status 10/11/2021 FINAL  Final   Organism ID, Bacteria KLEBSIELLA PNEUMONIAE  Final      Susceptibility   Klebsiella pneumoniae - MIC*    AMPICILLIN >=32 RESISTANT Resistant     CEFAZOLIN <=4 SENSITIVE Sensitive     CEFEPIME <=0.12 SENSITIVE Sensitive     CEFTAZIDIME <=1 SENSITIVE Sensitive     CEFTRIAXONE <=0.25 SENSITIVE Sensitive     CIPROFLOXACIN <=0.25 SENSITIVE Sensitive     GENTAMICIN <=1 SENSITIVE Sensitive     IMIPENEM <=0.25 SENSITIVE Sensitive     TRIMETH/SULFA <=20 SENSITIVE Sensitive     AMPICILLIN/SULBACTAM 4 SENSITIVE Sensitive     PIP/TAZO <=4 SENSITIVE Sensitive     * KLEBSIELLA PNEUMONIAE  Resp Panel by RT-PCR (Flu A&B, Covid) Nasopharyngeal Swab     Status: None   Collection Time: 10/08/21 12:02 AM   Specimen: Nasopharyngeal Swab; Nasopharyngeal(NP) swabs in vial transport  medium  Result Value Ref Range Status   SARS Coronavirus 2 by RT PCR NEGATIVE NEGATIVE Final    Comment: (NOTE) SARS-CoV-2 target nucleic acids are NOT DETECTED.  The SARS-CoV-2 RNA is generally detectable in upper respiratory specimens during the acute phase of infection. The lowest concentration of SARS-CoV-2 viral copies this assay can detect is 138 copies/mL. A negative result does not preclude SARS-Cov-2 infection and should not be used as the sole basis for treatment or other patient management decisions. A negative result may occur with  improper specimen collection/handling, submission of specimen other than nasopharyngeal swab, presence of viral mutation(s) within the areas targeted by this assay, and inadequate number of  viral copies(<138 copies/mL). A negative result must be combined with clinical observations, patient history, and epidemiological information. The expected result is Negative.  Fact Sheet for Patients:  EntrepreneurPulse.com.au  Fact Sheet for Healthcare Providers:  IncredibleEmployment.be  This test is no t yet approved or cleared by the [place] FDA and  has been authorized for detection and/or diagnosis of SARS-CoV-2 by FDA under an Emergency Use Authorization (EUA). This EUA will remain  in effect (meaning this test can be used) for the duration of the COVID-19 declaration under Section 564(b)(1) of the Act, 21 U.S.C.section 360bbb-3(b)(1), unless the authorization is terminated  or revoked sooner.       Influenza A by PCR NEGATIVE NEGATIVE Final   Influenza B by PCR NEGATIVE NEGATIVE Final    Comment: (NOTE) The Xpert Xpress SARS-CoV-2/FLU/RSV plus assay is intended as an aid in the diagnosis of influenza from Nasopharyngeal swab specimens and should not be used as a sole basis for treatment. Nasal washings and aspirates are unacceptable for Xpert Xpress SARS-CoV-2/FLU/RSV testing.  Fact Sheet for  Patients: EntrepreneurPulse.com.au  Fact Sheet for Healthcare Providers: IncredibleEmployment.be  This test is not yet approved or cleared by the [place] FDA and has been authorized for detection and/or diagnosis of SARS-CoV-2 by FDA under an Emergency Use Authorization (EUA). This EUA will remain in effect (meaning this test can be used) for the duration of the COVID-19 declaration under Section 564(b)(1) of the Act, 21 U.S.C. section 360bbb-3(b)(1), unless the authorization is terminated or revoked.  Performed at [facility], [place] [address], [place], [place] [address]   Blood Culture ID Panel (Reflexed)     Status: Abnormal   Collection Time: 10/08/21 12:02 AM  Result Value Ref Range Status   Enterococcus faecalis NOT DETECTED NOT DETECTED Final   Enterococcus Faecium NOT DETECTED NOT DETECTED Final   Listeria monocytogenes NOT DETECTED NOT DETECTED Final   Staphylococcus species NOT DETECTED NOT DETECTED Final   Staphylococcus aureus (BCID) NOT DETECTED NOT DETECTED Final   Staphylococcus epidermidis NOT DETECTED NOT DETECTED Final   Staphylococcus lugdunensis NOT DETECTED NOT DETECTED Final   Streptococcus species NOT DETECTED NOT DETECTED Final   Streptococcus agalactiae NOT DETECTED NOT DETECTED Final   Streptococcus pneumoniae NOT DETECTED NOT DETECTED Final   Streptococcus pyogenes NOT DETECTED NOT DETECTED Final   A.calcoaceticus-baumannii NOT DETECTED NOT DETECTED Final   Bacteroides fragilis NOT DETECTED NOT DETECTED Final   Enterobacterales DETECTED (A) NOT DETECTED Final    Comment: Enterobacterales represent a large order of gram negative bacteria, not a single organism. CRITICAL RESULT CALLED TO, READ BACK BY AND VERIFIED WITH: DREW WOFFORD PHARMD @1808  10/08/21 EB    Enterobacter cloacae complex NOT DETECTED NOT DETECTED Final   Escherichia coli NOT DETECTED NOT DETECTED Final   Klebsiella aerogenes  NOT DETECTED NOT DETECTED Final   Klebsiella oxytoca NOT DETECTED NOT DETECTED Final   Klebsiella pneumoniae DETECTED (A) NOT DETECTED Final    Comment: CRITICAL RESULT CALLED TO, READ BACK BY AND VERIFIED WITH: DREW WOFFORD PHARMD @1808  10/08/21 EB    Proteus species NOT DETECTED NOT DETECTED Final   Salmonella species NOT DETECTED NOT DETECTED Final   Serratia marcescens NOT DETECTED NOT DETECTED Final   Haemophilus influenzae NOT DETECTED NOT DETECTED Final   Neisseria meningitidis NOT DETECTED NOT DETECTED Final   Pseudomonas aeruginosa NOT DETECTED NOT DETECTED Final   Stenotrophomonas maltophilia NOT DETECTED NOT DETECTED Final   Candida albicans NOT DETECTED NOT DETECTED Final  Candida auris NOT DETECTED NOT DETECTED Final   Candida glabrata NOT DETECTED NOT DETECTED Final   Candida krusei NOT DETECTED NOT DETECTED Final   Candida parapsilosis NOT DETECTED NOT DETECTED Final   Candida tropicalis NOT DETECTED NOT DETECTED Final   Cryptococcus neoformans/gattii NOT DETECTED NOT DETECTED Final   CTX-M ESBL NOT DETECTED NOT DETECTED Final   Carbapenem resistance IMP NOT DETECTED NOT DETECTED Final   Carbapenem resistance KPC NOT DETECTED NOT DETECTED Final   Carbapenem resistance NDM NOT DETECTED NOT DETECTED Final   Carbapenem resist OXA 48 LIKE NOT DETECTED NOT DETECTED Final   Carbapenem resistance VIM NOT DETECTED NOT DETECTED Final    Comment: Performed at [facility] [address], [place], [place] [address]  Culture, blood (routine x 2)     Status: None (Preliminary result)   Collection Time: 10/09/21 11:33 AM   Specimen: BLOOD RIGHT HAND  Result Value Ref Range Status   Specimen Description   Final    BLOOD RIGHT HAND Performed at [facility] [address] [place], [place] [address]    Special Requests   Final    BOTTLES DRAWN AEROBIC ONLY Blood Culture results may not be optimal due to an inadequate volume of blood received in culture  bottles Performed at [facility] [address] [place], [place] [address]    Culture   Final    NO GROWTH 2 DAYS Performed at [facility] [address], [place], [place] [address]    Report Status PENDING  Incomplete  Culture, blood (routine x 2)     Status: None (Preliminary result)   Collection Time: 10/09/21 12:24 PM   Specimen: BLOOD  Result Value Ref Range Status   Specimen Description   Final    BLOOD PICC LINE Performed at [facility], [place] [address] [place] [address] [place], [place] [address]    Special Requests   Final    BOTTLES DRAWN AEROBIC AND ANAEROBIC Blood Culture results may not be optimal due to an inadequate volume of blood received in culture bottles Performed at [facility] [address], [place], [place] [address]    Culture   Final    NO GROWTH 2 DAYS Performed at [facility] [address] [place],  [address]    Report Status PENDING  Incomplete          Radiology Studies: No results found.      Scheduled Meds:  amLODipine  10 mg Oral Daily   calcium carbonate  1 tablet Oral Q breakfast   carvedilol  12.5 mg Oral BID   Chlorhexidine Gluconate Cloth  6 each Topical Daily   cholecalciferol  1,000 Units Oral Daily   ciprofloxacin 0.2 mg/ml + heparin 5000 units/ml  2.5 mL Intracatheter Q1200   ciprofloxacin 0.2 mg/ml + heparin 5000 units/ml  2.5 mL Intracatheter Q1200   cycloSPORINE  1 drop Both Eyes BID   enoxaparin (LOVENOX) injection  40 mg Subcutaneous Q24H   estradiol  2 mg Oral Daily   feeding supplement  1 Container Oral BID BM   hydrALAZINE  100 mg Oral TID   levETIRAcetam  500 mg Oral BID   lipase/protease/amylase  36,000 Units Oral TID WC   methadone  5 mg Oral 5 X Daily   methocarbamol  500 mg Oral BID   multivitamin-iron-minerals-folic acid   Oral Daily   pantoprazole  40 mg Oral Daily  sucralfate  1 g Oral BID   Teduglutide (rDNA)  1.5 mg Subcutaneous  Daily   vitamin B-12  1,000 mcg Oral Daily   Continuous Infusions:  levofloxacin (LEVAQUIN) IV 500 mg (10/12/21 1125)     LOS: 4 days    Time spent: 83mn  Vernadine Coombs C Myla Mauriello, DO [facility] Available via Epic secure chat 7am-7pm After these hours, please refer to coverage provider listed on [email or web address] 10/12/2021, 1:03 PM

## 2021-10-12 NOTE — Progress Notes (Addendum)
LATE ENTRY (10/12/21 @ 1105) VAST RN arranged updated plan of care on 10/11/21 for subsequent days as follows: To allow VAS/IV team to care for this patient's line and minimize number of times we access, ordered blood culture set and labs will be collected by VAST RN around 0930 so that unit RN can then administer pt's IV dose of Levaquin to be followed by pt's ciprofloxacin 0.2 mg/ml + heparin 5000 units/ml antibiotic lock solution 2.5 mL. After contacting physicians as listed below, times were adjusted for lab collection and pharmacy adjusted lock solution times.   Contacted Vu, MD with infectious disease via telephone to consolidate access of patient's right leg CVC; also notified unit nurse and Avon Gully, MD that blood cultures and labs changed to be collected around 0930, before administration of patient's daily IV abx dose. Once old lock solution and waste withdrawn and discarded, labs collected and line flushed with NS, antibiotic infusion to be administered by unit RN. Once abx infusion completed, VAST consult to be placed for VAST RN to instill abx/heparin lock solution and apply dead end caps to remind staff not to use central line. IF central line needs to be used, a specific order from MD to utilize CVC must be obtained and VAS consult placed to allow for proper withdrawal of lock solution and flushing with NS prior to CVC being used for infusion/medication.

## 2021-10-13 LAB — CBC
HCT: 26.6 % — ABNORMAL LOW (ref 36.0–46.0)
Hemoglobin: 8.8 g/dL — ABNORMAL LOW (ref 12.0–15.0)
MCH: 31.2 pg (ref 26.0–34.0)
MCHC: 33.1 g/dL (ref 30.0–36.0)
MCV: 94.3 fL (ref 80.0–100.0)
Platelets: 162 10*3/uL (ref 150–400)
RBC: 2.82 MIL/uL — ABNORMAL LOW (ref 3.87–5.11)
RDW: 14.7 % (ref 11.5–15.5)
WBC: 3.3 10*3/uL — ABNORMAL LOW (ref 4.0–10.5)
nRBC: 0 % (ref 0.0–0.2)

## 2021-10-13 LAB — BASIC METABOLIC PANEL
Anion gap: 7 (ref 5–15)
BUN: 14 mg/dL (ref 6–20)
CO2: 24 mmol/L (ref 22–32)
Calcium: 8.9 mg/dL (ref 8.9–10.3)
Chloride: 105 mmol/L (ref 98–111)
Creatinine, Ser: 1.85 mg/dL — ABNORMAL HIGH (ref 0.44–1.00)
GFR, Estimated: 31 mL/min — ABNORMAL LOW (ref >60)
Glucose, Bld: 156 mg/dL — ABNORMAL HIGH (ref 70–99)
Potassium: 3.3 mmol/L — ABNORMAL LOW (ref 3.5–5.1)
Sodium: 136 mmol/L (ref 135–145)

## 2021-10-13 MED ORDER — POTASSIUM CHLORIDE 10 MEQ/50ML IV SOLN
10.0000 meq | INTRAVENOUS | Status: DC
  Filled 2021-10-13 (×4): qty 50

## 2021-10-13 MED ORDER — INSULIN ASPART 100 UNIT/ML IJ SOLN: 0.0000 [IU] | INTRAMUSCULAR | Status: DC

## 2021-10-13 MED ORDER — POTASSIUM CHLORIDE 10 MEQ/50ML IV SOLN
10.0000 meq | INTRAVENOUS | Status: AC
  Administered 2021-10-13 (×4): 10 meq via INTRAVENOUS
  Filled 2021-10-13 (×4): qty 50

## 2021-10-13 NOTE — Progress Notes (Signed)
PHARMACY CONSULT NOTE FOR:  OUTPATIENT  PARENTERAL ANTIBIOTIC THERAPY (OPAT)  Indication: Klebsiella/acinetobacter bacteremia Regimen: Levofloxacin 500 mg IV + Ciprofloxacin intracatheter lock solution End date: 10/26/21   IV antibiotic discharge orders are pended. To discharging provider:  please sign these orders via discharge navigator,  Select New Orders & click on the button choice - Manage This Unsigned Work.     Thank you for allowing pharmacy to be a part of this patient's care.  Jimmy Footman, PharmD, BCPS, BCIDP Infectious Diseases Clinical Pharmacist Phone: [phone] 10/13/2021, 10:43 AM

## 2021-10-13 NOTE — Plan of Care (Signed)
?  Problem: Activity: ?Goal: Risk for activity intolerance will decrease ?Outcome: Progressing ?  ?Problem: Safety: ?Goal: Ability to remain free from injury will improve ?Outcome: Progressing ?  ?Problem: Pain Managment: ?Goal: General experience of comfort will improve ?Outcome: Progressing ?  ?

## 2021-10-13 NOTE — Care Management Important Message (Signed)
Important Message  Patient Details IM Letter placed in Patients room. Name: DAUNE DIVIRGILIO MRN: [id number] Date of Birth: [birth year]   Medicare Important Message Given:  Yes     [name] 10/13/2021, 11:36 AM

## 2021-10-13 NOTE — Plan of Care (Signed)
  Problem: Education: Goal: Knowledge of General Education information will improve Description: Including pain rating scale, medication(s)/side effects and non-pharmacologic comfort measures Outcome: Progressing   Problem: Clinical Measurements: Goal: Ability to maintain clinical measurements within normal limits will improve Outcome: Progressing   Problem: Coping: Goal: Level of anxiety will decrease Outcome: Progressing   Problem: Pain Managment: Goal: General experience of comfort will improve Outcome: Progressing   Problem: Safety: Goal: Ability to remain free from injury will improve Outcome: Progressing   

## 2021-10-13 NOTE — Progress Notes (Signed)
PHARMACY - TOTAL PARENTERAL NUTRITION CONSULT NOTE   Indication:  short bowel syndrome, chronic TPN  Patient Measurements: Height: 5' 8" (172.7 cm) Weight: 67.8 kg (149 lb 7.6 oz) IBW/kg (Calculated) : 63.9 TPN AdjBW (KG): 67.8 Body mass index is 22.73 kg/m.  Assessment: 60 year old female on chronic TPN followed by [facility] for short gut syndrome.  Currently being treated for PICC line infection.  History of recurrent line infections and difficulty with replacing central line. Per ID, ok to resume TPN.   Glucose / Insulin: CBGs < 180; no SSI ordered- will initiate when TPN restart Electrolytes: K low at 3.3; no mag or phos this admit - other lytes wnl Renal: Scr elevated at 1.85 (BL ~1.4), BUN wnl Hepatic: LFTs and alk phos elevated on admit > alk phos now wnl, AST now wnl, ALT still elevated but downtrending Intake / Output; MIVF: LBM 11/15, no mIVF, intake ~240 mL GI Imaging: - 11/13: few distended loops of small bowel in abdomen measuring up to 3.3 cm, similar appearance to the prior exam > patulous bowel vs SBO GI Surgeries / Procedures:  - none this admit  Central access: Prior double-lumen PICC line in place TPN start date: begin inpatient 11/19, continued from PTA  Home TPN regimen from [facility]:  - 1800ml over 12hrs daily  - Provides 85 grams per day, 1088 kcal per day 4x/week - 20% SMOF lipids added 3x/week provides 85 grams protein per day, 1688.    Nutritional Goals: Goal TPN rate provides 90 g of protein and 1818 kcals per day  RD Assessment: Estimated Needs Total Energy Estimated Needs: 1900-2100 kcal Total Protein Estimated Needs: 90-105 grams Total Fluid Estimated Needs: >/= 2 L/day  Current Nutrition:  Regular diet and TPN (TPN to begin 11/19)  Plan:  - Give Kcl IV runs x4 - Planning to begin TPN on 11/19 - Current plan is to cycle TPN over 12 hours like PTA given the need for antibiotic lock therapy - Receiving MVI PO  Mary-Ashlyn ,  PharmD 10/13/2021 2:32 PM   

## 2021-10-13 NOTE — Progress Notes (Signed)
PROGRESS NOTE    [name]  HQP:[id number] DOB: 01/07/1961 DOA: 10/07/2021 PCP: Caitlin Peters, [name], MD    Brief Narrative:  Caitlin Peters is a 60 year old female with past medical history significant for Crohn's disease, short bowel syndrome, severe protein calorie malnutrition on TPN since 2003 via right thigh PICC line, essential hypertension, GERD, anxiety/depression, seizure disorder who presents to [facility] ED on 11/12 with fever.  Patient reports day prior, returned from a walk and started having crampy abdominal pain and throbbing in her low back.  She noted she had a fever of 102.0 F at home and took Tylenol.  Due to concern for recurrent history of infections, she called EMS and was brought to the ED for further evaluation.  Assessment & Plan:   Klebsiella pneumonia septicemia, POA Concurrent Acinetobacter infection, POA - Recurrent history of CLABSI 2/2 chronic PICC line use for TPN.   - Repeat cultures here initially remarkable for Kelbsiella, now showing acinetobacter as well - ID following - continue PICC lock w/ abx - current regimen levaquin/cipro - Repeat cultures preliminary negative - reassuring for infection control - Continue to hold all superfluous IV meds/fluids to properly treat line infection per ID/pharmacy discussion  Elevated LFTs, resolving - Possibly secondary to disseminated infection - PRN tylenol use at home but seems appropriate dose/schedule -AST/ALT/Bili downtrending appropriately  Abdominal pain, resolved Patulous bowel vs early/partial small bowel obstruction - CT abdomen/pelvis: few distended small bowel loops similar in appearance to partial/early SBO - no difficulty with BM per patient today  Hypokalemia Continue to replete in TPN as necessary given short gut syndrome - somewhat difficult given ongoing need for abx/lock via PICC line  Essential hypertension - Continue home carvedilol/amlodipine/hydralazine as scheduled  Seizure  Disorder: Keppra 500 mg p.o. twice daily  Short-bowel syndrome - Teduglutide 1.13m [place] daily - Strict I's and O's - Resume TPN once cleared by ID/per locking abx protocol - will advance PO diet as much as tolerated by patient in hopes to reduce TPN burden  Pancreatic insufficiency: - Creon 36,000 units 3 times daily  Chronic pain syndrome:  --Methadone 5 mg 5x daily --Robaxin 5 1 mg p.o. twice daily --Dilaudid transitioned back to PO route given need to limit PICC access due to ongoing infection and continued abx lock  GERD:  --Protonix 40 mg p.o. daily --Carafate 1 g p.o. twice daily --Famotidine 20 mg p.o. daily as needed heartburn/indigestion  CKD stage IIIb - Creatinine near baseline and stable - avoid IV fluids unless able to bolus prior to abx lock per discussion with ID/Pharmacy  Chronic normocytic/iron deficiency anemia Baseline hemoglobin 7-9 over the past few months.  Hemoglobin 8.4, stable.  Chronic thrombocytopenia Etiology likely secondary to chronic illness/malnutrition.  Platelet count stable.  Moderate protein calorie malnutrition Etiology likely secondary to underlying Crohn's disease with history of short bowel syndrome.  Has been on TPN since 2003. Body mass index is 22.73 kg/m. Nutrition Status: Nutrition Problem: Moderate Malnutrition Etiology: chronic illness (short bowel syndrome) Signs/Symptoms: mild fat depletion, mild muscle depletion, moderate muscle depletion Interventions: Boost Breeze, MVI --Holding TPN for now given septicemia as above --Regular diet --Dietitian following   DVT prophylaxis: enoxaparin (LOVENOX) injection 40 mg Start: 10/08/21 1800   Code Status: Full Code Family Communication: No family present at bedside this morning  Disposition Plan:  Level of care: Med-Surg Status is: Inpatient  Remains inpatient appropriate because: IV antibiotics, awaiting blood culture susceptibilities and repeat blood cultures, anticipate  discharge 2-3 days  Consultants:  Infectious disease, Dr. Gale Journey  Procedures:  None  Antimicrobials:  Vancomycin 11/13 - 11/14 Meropenem 11/13 - 11/14 Ceftriaxone 11/14 - 11/16 Levaquin/Cipro 11/16 >> ongoing   Subjective: No acute issues or events overnight, tolerating PO well -denies headache fever chills nausea vomiting diarrhea constipation chest pain shortness of breath  Objective: Vitals:   10/12/21 1428 10/12/21 2150 10/12/21 2230 10/13/21 0527  BP: (!) 162/75 (!) 159/67  (!) 162/71  Pulse: 81 (!) 50 61 69  Resp: 16 15  15   Temp: 99.4 F (37.4 C) 98.1 F (36.7 C)  98.2 F (36.8 C)  TempSrc: Oral Oral  Oral  SpO2: 100% 100%  99%  Weight:      Height:        Intake/Output Summary (Last 24 hours) at 10/13/2021 0736 Last data filed at 10/13/2021 0600 Gross per 24 hour  Intake 860 ml  Output --  Net 860 ml    Filed Weights   10/07/21 2327 10/08/21 0636  Weight: 70.3 kg 67.8 kg    Examination:  General exam: Appears calm and comfortable  Respiratory system: Clear to auscultation. Respiratory effort normal.  On room air Cardiovascular system: S1 & S2 heard, RRR. No JVD, murmurs, rubs, gallops or clicks. No pedal edema. Gastrointestinal system: Abdomen is nondistended, soft and nontender. No organomegaly or masses felt. Normal bowel sounds heard. Central nervous system: Alert and oriented. No focal neurological deficits. Extremities: Symmetric 5 x 5 power.,  Right thigh PICC line noted without erythema/fluctuance/purulance Skin: No rashes, lesions or ulcers Psychiatry: Judgement and insight appear normal. Mood & affect appropriate.   Data Reviewed: I have personally reviewed following labs and imaging studies  CBC: Recent Labs  Lab 10/08/21 0002 10/09/21 0842 10/12/21 1113  WBC 7.3 3.8* 3.4*  NEUTROABS 6.7  --   --   HGB 7.8* 8.4* 9.0*  HCT 23.6* 26.3* 26.2*  MCV 95.2 98.1 92.9  PLT 126* PLATELET CLUMPS NOTED ON SMEAR, UNABLE TO ESTIMATE 164     Basic Metabolic Panel: Recent Labs  Lab 10/08/21 0002 10/09/21 0842 10/10/21 0426 10/11/21 0428 10/12/21 1113  NA 137 136 139 138 138  K 3.7 3.5 3.2* 3.8 3.4*  CL 103 104 107 107 105  CO2 28 25 26 24 26   GLUCOSE 94 83 97 95 106*  BUN 33* 20 16 13 11   CREATININE 1.68* 1.63* 1.47* 1.29* 1.44*  CALCIUM 8.4* 8.4* 8.5* 8.8* 9.1  MG 1.9  --   --  1.9  --     GFR: Estimated Creatinine Clearance: 41.9 mL/min (A) (by C-G formula based on SCr of 1.44 mg/dL (H)). Liver Function Tests: Recent Labs  Lab 10/08/21 0002 10/09/21 0842 10/10/21 0426 10/11/21 0428  AST 267* 93* 37 23  ALT 221* 241* 154* 114*  ALKPHOS 156* 150* 128* 124  BILITOT 0.7 0.9 0.5 0.7  PROT 5.7* 5.7* 5.5* 5.8*  ALBUMIN 2.7* 2.6* 2.3* 2.5*    Recent Labs  Lab 10/08/21 0002  LIPASE 22    No results for input(s): AMMONIA in the last 168 hours. Coagulation Profile: No results for input(s): INR, PROTIME in the last 168 hours. Cardiac Enzymes: No results for input(s): CKTOTAL, CKMB, CKMBINDEX, TROPONINI in the last 168 hours. BNP (last 3 results) No results for input(s): PROBNP in the last 8760 hours. HbA1C: No results for input(s): HGBA1C in the last 72 hours. CBG: No results for input(s): GLUCAP in the last 168 hours. Lipid Profile: No results for input(s): CHOL, HDL, LDLCALC,  TRIG, CHOLHDL, LDLDIRECT in the last 72 hours. Thyroid Function Tests: No results for input(s): TSH, T4TOTAL, FREET4, T3FREE, THYROIDAB in the last 72 hours. Anemia Panel: No results for input(s): VITAMINB12, FOLATE, FERRITIN, TIBC, IRON, RETICCTPCT in the last 72 hours. Sepsis Labs: Recent Labs  Lab 10/08/21 0002  LATICACIDVEN 1.1     Recent Results (from the past 240 hour(s))  Blood culture (routine x 2)     Status: Abnormal (Preliminary result)   Collection Time: 10/08/21 12:02 AM   Specimen: BLOOD  Result Value Ref Range Status   Specimen Description   Final    BLOOD RIGHT PICC LINE Performed at [facility], [place] [address], [place], [place] [address]    Special Requests   Final    BOTTLES DRAWN AEROBIC AND ANAEROBIC Blood Culture adequate volume Performed at [facility] [address] [place], [place] [address]    Culture  Setup Time   Final    GRAM NEGATIVE RODS AEROBIC BOTTLE ONLY CRITICAL VALUE NOTED.  VALUE IS CONSISTENT WITH PREVIOUSLY REPORTED AND CALLED VALUE.    Culture (A)  Final    ACINETOBACTER URSINGII Sent to [place] for further susceptibility testing. Performed at [facility], [place] [address] [place], [place] [address]    Report Status PENDING  Incomplete  Blood culture (routine x 2)     Status: Abnormal   Collection Time: 10/08/21 12:02 AM   Specimen: BLOOD  Result Value Ref Range Status   Specimen Description   Final    BLOOD RIGHT PORTA CATH Performed at [place] [address] [place], [place] [address]    Special Requests   Final    BOTTLES DRAWN AEROBIC AND ANAEROBIC Blood Culture adequate volume Performed at [place] [address] [place] [address], [place], [place] [address]    Culture  Setup Time   Final    GRAM NEGATIVE RODS IN BOTH AEROBIC AND ANAEROBIC BOTTLES CRITICAL RESULT CALLED TO, READ BACK BY AND VERIFIED WITH: Reuel Boom PHARMD @1808  10/08/21 EB Performed at [facility] [address], [place], [place] [address]    Culture KLEBSIELLA PNEUMONIAE (A)  Final   Report Status 10/11/2021 FINAL  Final   Organism ID, Bacteria KLEBSIELLA PNEUMONIAE  Final      Susceptibility   Klebsiella pneumoniae - MIC*    AMPICILLIN >=32 RESISTANT Resistant     CEFAZOLIN <=4 SENSITIVE Sensitive     CEFEPIME <=0.12 SENSITIVE Sensitive     CEFTAZIDIME <=1 SENSITIVE Sensitive     CEFTRIAXONE <=0.25 SENSITIVE Sensitive     CIPROFLOXACIN <=0.25 SENSITIVE Sensitive     GENTAMICIN <=1 SENSITIVE Sensitive     IMIPENEM <=0.25 SENSITIVE Sensitive     TRIMETH/SULFA <=20 SENSITIVE  Sensitive     AMPICILLIN/SULBACTAM 4 SENSITIVE Sensitive     PIP/TAZO <=4 SENSITIVE Sensitive     * KLEBSIELLA PNEUMONIAE  Resp Panel by RT-PCR (Flu A&B, Covid) Nasopharyngeal Swab     Status: None   Collection Time: 10/08/21 12:02 AM   Specimen: Nasopharyngeal Swab; Nasopharyngeal(NP) swabs in vial transport medium  Result Value Ref Range Status   SARS Coronavirus 2 by RT PCR NEGATIVE NEGATIVE Final    Comment: (NOTE) SARS-CoV-2 target nucleic acids are NOT DETECTED.  The SARS-CoV-2 RNA is generally detectable in upper respiratory specimens during the acute phase of infection. The lowest concentration of SARS-CoV-2 viral copies this assay can detect is 138 copies/mL. A negative result does not  preclude SARS-Cov-2 infection and should not be used as the sole basis for treatment or other patient management decisions. A negative result may occur with  improper specimen collection/handling, submission of specimen other than nasopharyngeal swab, presence of viral mutation(s) within the areas targeted by this assay, and inadequate number of viral copies(<138 copies/mL). A negative result must be combined with clinical observations, patient history, and epidemiological information. The expected result is Negative.  Fact Sheet for Patients:  EntrepreneurPulse.com.au  Fact Sheet for Healthcare Providers:  IncredibleEmployment.be  This test is no t yet approved or cleared by the [place] FDA and  has been authorized for detection and/or diagnosis of SARS-CoV-2 by FDA under an Emergency Use Authorization (EUA). This EUA will remain  in effect (meaning this test can be used) for the duration of the COVID-19 declaration under Section 564(b)(1) of the Act, 21 U.S.C.section 360bbb-3(b)(1), unless the authorization is terminated  or revoked sooner.       Influenza A by PCR NEGATIVE NEGATIVE Final   Influenza B by PCR NEGATIVE NEGATIVE Final     Comment: (NOTE) The Xpert Xpress SARS-CoV-2/FLU/RSV plus assay is intended as an aid in the diagnosis of influenza from Nasopharyngeal swab specimens and should not be used as a sole basis for treatment. Nasal washings and aspirates are unacceptable for Xpert Xpress SARS-CoV-2/FLU/RSV testing.  Fact Sheet for Patients: EntrepreneurPulse.com.au  Fact Sheet for Healthcare Providers: IncredibleEmployment.be  This test is not yet approved or cleared by the [place] FDA and has been authorized for detection and/or diagnosis of SARS-CoV-2 by FDA under an Emergency Use Authorization (EUA). This EUA will remain in effect (meaning this test can be used) for the duration of the COVID-19 declaration under Section 564(b)(1) of the Act, 21 U.S.C. section 360bbb-3(b)(1), unless the authorization is terminated or revoked.  Performed at [facility], [name] [address] [place], [place] [address]   Blood Culture ID Panel (Reflexed)     Status: Abnormal   Collection Time: 10/08/21 12:02 AM  Result Value Ref Range Status   Enterococcus faecalis NOT DETECTED NOT DETECTED Final   Enterococcus Faecium NOT DETECTED NOT DETECTED Final   Listeria monocytogenes NOT DETECTED NOT DETECTED Final   Staphylococcus species NOT DETECTED NOT DETECTED Final   Staphylococcus aureus (BCID) NOT DETECTED NOT DETECTED Final   Staphylococcus epidermidis NOT DETECTED NOT DETECTED Final   Staphylococcus lugdunensis NOT DETECTED NOT DETECTED Final   Streptococcus species NOT DETECTED NOT DETECTED Final   Streptococcus agalactiae NOT DETECTED NOT DETECTED Final   Streptococcus pneumoniae NOT DETECTED NOT DETECTED Final   Streptococcus pyogenes NOT DETECTED NOT DETECTED Final   A.calcoaceticus-baumannii NOT DETECTED NOT DETECTED Final   Bacteroides fragilis NOT DETECTED NOT DETECTED Final   Enterobacterales DETECTED (A) NOT DETECTED Final    Comment:  Enterobacterales represent a large order of gram negative bacteria, not a single organism. CRITICAL RESULT CALLED TO, READ BACK BY AND VERIFIED WITH: DREW WOFFORD PHARMD @1808  10/08/21 EB    Enterobacter cloacae complex NOT DETECTED NOT DETECTED Final   Escherichia coli NOT DETECTED NOT DETECTED Final   Klebsiella aerogenes NOT DETECTED NOT DETECTED Final   Klebsiella oxytoca NOT DETECTED NOT DETECTED Final   Klebsiella pneumoniae DETECTED (A) NOT DETECTED Final    Comment: CRITICAL RESULT CALLED TO, READ BACK BY AND VERIFIED WITH: DREW WOFFORD PHARMD @1808  10/08/21 EB    Proteus species NOT DETECTED NOT DETECTED Final   Salmonella species NOT DETECTED NOT DETECTED Final   Serratia marcescens  NOT DETECTED NOT DETECTED Final   Haemophilus influenzae NOT DETECTED NOT DETECTED Final   Neisseria meningitidis NOT DETECTED NOT DETECTED Final   Pseudomonas aeruginosa NOT DETECTED NOT DETECTED Final   Stenotrophomonas maltophilia NOT DETECTED NOT DETECTED Final   Candida albicans NOT DETECTED NOT DETECTED Final   Candida auris NOT DETECTED NOT DETECTED Final   Candida glabrata NOT DETECTED NOT DETECTED Final   Candida krusei NOT DETECTED NOT DETECTED Final   Candida parapsilosis NOT DETECTED NOT DETECTED Final   Candida tropicalis NOT DETECTED NOT DETECTED Final   Cryptococcus neoformans/gattii NOT DETECTED NOT DETECTED Final   CTX-M ESBL NOT DETECTED NOT DETECTED Final   Carbapenem resistance IMP NOT DETECTED NOT DETECTED Final   Carbapenem resistance KPC NOT DETECTED NOT DETECTED Final   Carbapenem resistance NDM NOT DETECTED NOT DETECTED Final   Carbapenem resist OXA 48 LIKE NOT DETECTED NOT DETECTED Final   Carbapenem resistance VIM NOT DETECTED NOT DETECTED Final    Comment: Performed at [facility], [address] [place], [place] [address]  Culture, blood (routine x 2)     Status: None (Preliminary result)   Collection Time: 10/09/21 11:33 AM   Specimen: BLOOD RIGHT HAND   Result Value Ref Range Status   Specimen Description   Final    BLOOD RIGHT HAND Performed at [facility] [address], [place], [name] [address]    Special Requests   Final    BOTTLES DRAWN AEROBIC ONLY Blood Culture results may not be optimal due to an inadequate volume of blood received in culture bottles Performed at [facility] [address] [place], [place] [address]    Culture   Final    NO GROWTH 3 DAYS Performed at [facility], [place] [address] [place], [place] [address]    Report Status PENDING  Incomplete  Culture, blood (routine x 2)     Status: None (Preliminary result)   Collection Time: 10/09/21 12:24 PM   Specimen: BLOOD  Result Value Ref Range Status   Specimen Description   Final    BLOOD PICC LINE Performed at [facility], [place] [address], [place], [place] [address]    Special Requests   Final    BOTTLES DRAWN AEROBIC AND ANAEROBIC Blood Culture results may not be optimal due to an inadequate volume of blood received in culture bottles Performed at [place] [address], [place], [place] [address]    Culture   Final    NO GROWTH 3 DAYS Performed at [facility], [place] [address], [place],  [address]    Report Status PENDING  Incomplete     Radiology Studies: No results found.  Scheduled Meds:  amLODipine  10 mg Oral Daily   calcium carbonate  1 tablet Oral Q breakfast   carvedilol  12.5 mg Oral BID   Chlorhexidine Gluconate Cloth  6 each Topical Daily   cholecalciferol  1,000 Units Oral Daily   ciprofloxacin 0.2 mg/ml + heparin 5000 units/ml  2.5 mL Intracatheter Q1200   ciprofloxacin 0.2 mg/ml + heparin 5000 units/ml  2.5 mL Intracatheter Q1200   cycloSPORINE  1 drop Both Eyes BID   enoxaparin (LOVENOX) injection  40 mg Subcutaneous Q24H   estradiol  2 mg Oral Daily   feeding supplement  1 Container Oral BID BM   hydrALAZINE  100 mg Oral  TID   levETIRAcetam  500 mg Oral BID  lipase/protease/amylase  36,000 Units Oral TID WC   methadone  5 mg Oral 5 X Daily   methocarbamol  500 mg Oral BID   multivitamin-iron-minerals-folic acid   Oral Daily   pantoprazole  40 mg Oral Daily   sucralfate  1 g Oral BID   Teduglutide (rDNA)  1.5 mg Subcutaneous Daily   vitamin B-12  1,000 mcg Oral Daily   Continuous Infusions:  levofloxacin (LEVAQUIN) IV Stopped (10/12/21 1225)     LOS: 5 days    Time spent: 31mn  Analiza Cowger C Vahe Pienta, DO  [facility] Available via Epic secure chat 7am-7pm After these hours, please refer to coverage provider listed on [email or web address] 10/13/2021, 7:36 AM

## 2021-10-14 LAB — CBC
HCT: 25.9 % — ABNORMAL LOW (ref 36.0–46.0)
Hemoglobin: 8.5 g/dL — ABNORMAL LOW (ref 12.0–15.0)
MCH: 31.3 pg (ref 26.0–34.0)
MCHC: 32.8 g/dL (ref 30.0–36.0)
MCV: 95.2 fL (ref 80.0–100.0)
Platelets: 168 10*3/uL (ref 150–400)
RBC: 2.72 MIL/uL — ABNORMAL LOW (ref 3.87–5.11)
RDW: 14.7 % (ref 11.5–15.5)
WBC: 3.2 10*3/uL — ABNORMAL LOW (ref 4.0–10.5)
nRBC: 0 % (ref 0.0–0.2)

## 2021-10-14 LAB — CULTURE, BLOOD (ROUTINE X 2)
Culture: NO GROWTH
Culture: NO GROWTH

## 2021-10-14 LAB — BASIC METABOLIC PANEL
Anion gap: 8 (ref 5–15)
BUN: 19 mg/dL (ref 6–20)
CO2: 23 mmol/L (ref 22–32)
Calcium: 9.2 mg/dL (ref 8.9–10.3)
Chloride: 106 mmol/L (ref 98–111)
Creatinine, Ser: 1.82 mg/dL — ABNORMAL HIGH (ref 0.44–1.00)
GFR, Estimated: 31 mL/min — ABNORMAL LOW (ref >60)
Glucose, Bld: 96 mg/dL (ref 70–99)
Potassium: 4.2 mmol/L (ref 3.5–5.1)
Sodium: 137 mmol/L (ref 135–145)

## 2021-10-14 LAB — MAGNESIUM: Magnesium: 2 mg/dL (ref 1.7–2.4)

## 2021-10-14 LAB — GLUCOSE, CAPILLARY
Glucose-Capillary: 93 mg/dL (ref 70–99)
Glucose-Capillary: 123 mg/dL — ABNORMAL HIGH (ref 70–99)

## 2021-10-14 LAB — PHOSPHORUS: Phosphorus: 3.9 mg/dL (ref 2.5–4.6)

## 2021-10-14 LAB — TRIGLYCERIDES: Triglycerides: 51 mg/dL (ref <150)

## 2021-10-14 MED ORDER — TRAVASOL 10 % IV SOLN
INTRAVENOUS | Status: AC
  Filled 2021-10-14: qty 450

## 2021-10-14 MED ORDER — ENOXAPARIN SODIUM 30 MG/0.3ML IJ SOSY
30.0000 mg | PREFILLED_SYRINGE | INTRAMUSCULAR | Status: DC
  Administered 2021-10-14 – 2021-10-15 (×2): 30 mg via SUBCUTANEOUS
  Filled 2021-10-14 (×2): qty 0.3

## 2021-10-14 MED ORDER — INSULIN ASPART 100 UNIT/ML IJ SOLN: 0.0000 [IU] | Freq: Three times a day (TID) | INTRAMUSCULAR | Status: DC

## 2021-10-14 MED ORDER — INSULIN ASPART 100 UNIT/ML IJ SOLN
0.0000 [IU] | Freq: Every day | INTRAMUSCULAR | Status: DC
Start: 2021-10-14 — End: 2021-10-16

## 2021-10-14 NOTE — Progress Notes (Signed)
PHARMACY - TOTAL PARENTERAL NUTRITION CONSULT NOTE   Indication:  short bowel syndrome, chronic TPN  Patient Measurements: Height: 5' 8"  (172.7 cm) Weight: 53.3 kg (117 lb 8.1 oz) IBW/kg (Calculated) : 63.9 TPN AdjBW (KG): 67.8 Body mass index is 17.87 kg/m.  Assessment: 60 year old female on chronic TPN followed by [facility] for short gut syndrome.  Currently being treated for PICC line infection.  History of recurrent line infections and difficulty with replacing central line. Per ID, ok to resume TPN.   Glucose / Insulin: CBGs < 180; no SSI ordered- will initiate when TPN restart Electrolytes: elytes wnl Renal: Scr elevated but slightly improved to 1.82 (BL ~1.4), BUN wnl Hepatic: LFTs and alk phos elevated on admit > alk phos now wnl, AST now wnl, ALT still elevated but downtrending Intake / Output; MIVF: LBM 11/18, no mIVF, intake ~900 mL GI Imaging: - 11/13: few distended loops of small bowel in abdomen measuring up to 3.3 cm, similar appearance to the prior exam > patulous bowel vs SBO GI Surgeries / Procedures:  - none this admit  Central access: Prior double-lumen PICC line in place TPN start date: begin inpatient 11/19, continued from PTA  Home TPN regimen from [facility]:  - 1835m over 12hrs daily  - Provides 85 grams per day, 1088 kcal per day 4x/week - 20% SMOF lipids added 3x/week provides 85 grams protein per day, 1688.    Nutritional Goals: Goal TPN rate provides 90 g of protein and 1818 kcals per day  RD Assessment: Estimated Needs Total Energy Estimated Needs: 1900-2100 kcal Total Protein Estimated Needs: 90-105 grams Total Fluid Estimated Needs: >/= 2 L/day  Current Nutrition:  Regular diet and TPN (TPN to begin 11/19)  Plan:  Will cycle TPN at home rate over 12 hours using current RD recommendations for inpatient Start cycle TPN at half rate (900 mL/day) over 12 hours at 1800 Start rate at 41 mL/hr for 1 hours Increase rate to 82 mL/hr for 10  hours. Decrease rate to 41 mL/hr for 1 hours, then stop. Electrolytes in TPN:  Na 586m/L K 4038mL Ca 5mE81m Mg 5mEq28mPhos 12mmo74mCl:Ac 1:1 Add trace elements to TPN Remove MVI from TPN as patient is taking PO Initiate Sensitive SSI TID AC and QHS - adjust as needed  MIVF per MD Monitor TPN labs on Mon/Thurs, BMET, Mg, Phos 11/20 Discussed w/ IV team RN  - will receive cycle TPN over 12 hours, and antibiotic lock when TPN is not running.    Mary-ADimple NanasmD 10/14/2021 8:09 AM

## 2021-10-14 NOTE — Plan of Care (Signed)

## 2021-10-14 NOTE — Progress Notes (Signed)
PROGRESS NOTE    Caitlin Peters  PFY:[id number] DOB: 07/27/1961 DOA: 10/07/2021 PCP: [name], [name], MD    Brief Narrative:  Caitlin Peters is a 60 year old female with past medical history significant for Crohn's disease, short bowel syndrome, severe protein calorie malnutrition on TPN since 2003 via right thigh PICC line, essential hypertension, GERD, anxiety/depression, seizure disorder who presents to [facility] ED on 11/12 with fever.  Patient reports day prior, returned from a walk and started having crampy abdominal pain and throbbing in her low back.  She noted she had a fever of 102.0 F at home and took Tylenol.  Due to concern for recurrent history of infections, she called EMS and was brought to the ED for further evaluation.  Assessment & Plan:   Klebsiella pneumonia septicemia, POA Concurrent Acinetobacter infection, POA - Recurrent history of CLABSI 2/2 chronic PICC line use for TPN.   - Repeat cultures here initially remarkable for Kelbsiella, now showing acinetobacter as well - ID following - continue PICC lock w/ abx - current regimen levaquin/cipro - Repeat cultures preliminary negative - reassuring for infection control - Continue to hold all superfluous IV meds/fluids to properly treat line infection per ID/pharmacy discussion  Elevated LFTs, resolving - Possibly secondary to disseminated infection - PRN tylenol use at home but seems appropriate dose/schedule -AST/ALT/Bili downtrending appropriately  Abdominal pain, resolved Patulous bowel vs early/partial small bowel obstruction - CT abdomen/pelvis: few distended small bowel loops similar in appearance to partial/early SBO - no difficulty with BM per patient today  Hypokalemia Continue to replete in TPN as necessary given short gut syndrome - somewhat difficult given ongoing need for abx/lock via PICC line  Essential hypertension - Continue home carvedilol/amlodipine/hydralazine as scheduled  Seizure  Disorder: Keppra 500 mg p.o. twice daily  Short-bowel syndrome - Teduglutide 1.41m Rhame daily - Strict I's and O's - Resume TPN in the next 24 to 48 hours, tolerating p.o. quite poorly as of yesterday  Pancreatic insufficiency: - Creon 36,000 units 3 times daily  Chronic pain syndrome:  --Methadone 5 mg 5x daily --Robaxin 5 1 mg p.o. twice daily --Dilaudid transitioned back to PO route given need to limit PICC access due to ongoing infection and continued abx lock  GERD:  --Protonix 40 mg p.o. daily --Carafate 1 g p.o. twice daily --Famotidine 20 mg p.o. daily as needed heartburn/indigestion  CKD stage IIIb - Creatinine near baseline and stable - avoid IV fluids unless able to bolus prior to abx lock per discussion with ID/Pharmacy  Chronic normocytic/iron deficiency anemia Baseline hemoglobin 7-9 over the past few months.  Hemoglobin 8.4, stable.  Chronic thrombocytopenia Etiology likely secondary to chronic illness/malnutrition.  Platelet count stable.  Moderate protein calorie malnutrition Etiology likely secondary to underlying Crohn's disease with history of short bowel syndrome.  Has been on TPN since 2003. Body mass index is 17.87 kg/m. Nutrition Status: Nutrition Problem: Moderate Malnutrition Etiology: chronic illness (short bowel syndrome) Signs/Symptoms: mild fat depletion, mild muscle depletion, moderate muscle depletion Interventions: Boost Breeze, MVI --Holding TPN for now given septicemia as above --Regular diet --Dietitian following   DVT prophylaxis: enoxaparin (LOVENOX) injection 40 mg Start: 10/08/21 1800   Code Status: Full Code Family Communication: No family present at bedside this morning  Disposition Plan:  Level of care: Med-Surg Status is: Inpatient  Remains inpatient appropriate because: IV antibiotics, awaiting blood culture susceptibilities and repeat blood cultures, anticipate discharge 2-3 days    Consultants:  Infectious disease,  Dr. VGale Journey  Procedures:  None  Antimicrobials:  Vancomycin 11/13 - 11/14 Meropenem 11/13 - 11/14 Ceftriaxone 11/14 - 11/16 Levaquin/Cipro 11/16 >> ongoing   Subjective: No acute issues or events overnight, some abdominal pain and bloating in the setting of increased p.o. intake, denies any overt nausea or vomiting.  Otherwise denies chest pain fevers chills shortness of breath.  Objective: Vitals:   10/13/21 2200 10/13/21 2217 10/14/21 0204 10/14/21 0558  BP: (!) 142/62  (!) 144/78 132/79  Pulse: (!) 42 82 77 (!) 52  Resp: 17  16 16   Temp: 98.9 F (37.2 C)  98.3 F (36.8 C) 98.5 F (36.9 C)  TempSrc: Oral  Oral Oral  SpO2: 99%  100% 99%  Weight:      Height:        Intake/Output Summary (Last 24 hours) at 10/14/2021 0728 Last data filed at 10/14/2021 0413 Gross per 24 hour  Intake 1127.88 ml  Output --  Net 1127.88 ml    Filed Weights   10/07/21 2327 10/08/21 0636 10/13/21 1438  Weight: 70.3 kg 67.8 kg 53.3 kg    Examination:  General exam: Appears calm and comfortable  Respiratory system: Clear to auscultation. Respiratory effort normal.  On room air Cardiovascular system: S1 & S2 heard, RRR. No JVD, murmurs, rubs, gallops or clicks. No pedal edema. Gastrointestinal system: Abdomen is nondistended, soft and nontender. No organomegaly or masses felt. Normal bowel sounds heard. Central nervous system: Alert and oriented. No focal neurological deficits. Extremities: Symmetric 5 x 5 power.,  Right thigh PICC line noted without erythema/fluctuance/purulance Skin: No rashes, lesions or ulcers Psychiatry: Judgement and insight appear normal. Mood & affect appropriate.   Data Reviewed: I have personally reviewed following labs and imaging studies  CBC: Recent Labs  Lab 10/08/21 0002 10/09/21 0842 10/12/21 1113 10/13/21 0930  WBC 7.3 3.8* 3.4* 3.3*  NEUTROABS 6.7  --   --   --   HGB 7.8* 8.4* 9.0* 8.8*  HCT 23.6* 26.3* 26.2* 26.6*  MCV 95.2 98.1 92.9 94.3  PLT  126* PLATELET CLUMPS NOTED ON SMEAR, UNABLE TO ESTIMATE 164 858    Basic Metabolic Panel: Recent Labs  Lab 10/08/21 0002 10/09/21 0842 10/10/21 0426 10/11/21 0428 10/12/21 1113 10/13/21 0930 10/14/21 0418  NA 137 136 139 138 138 136  --   K 3.7 3.5 3.2* 3.8 3.4* 3.3*  --   CL 103 104 107 107 105 105  --   CO2 28 25 26 24 26 24   --   GLUCOSE 94 83 97 95 106* 156*  --   BUN 33* 20 16 13 11 14   --   CREATININE 1.68* 1.63* 1.47* 1.29* 1.44* 1.85*  --   CALCIUM 8.4* 8.4* 8.5* 8.8* 9.1 8.9  --   MG 1.9  --   --  1.9  --   --  2.0  PHOS  --   --   --   --   --   --  3.9    GFR: Estimated Creatinine Clearance: 27.2 mL/min (A) (by C-G formula based on SCr of 1.85 mg/dL (H)). Liver Function Tests: Recent Labs  Lab 10/08/21 0002 10/09/21 0842 10/10/21 0426 10/11/21 0428  AST 267* 93* 37 23  ALT 221* 241* 154* 114*  ALKPHOS 156* 150* 128* 124  BILITOT 0.7 0.9 0.5 0.7  PROT 5.7* 5.7* 5.5* 5.8*  ALBUMIN 2.7* 2.6* 2.3* 2.5*    Recent Labs  Lab 10/08/21 0002  LIPASE 22    No results for  input(s): AMMONIA in the last 168 hours. Coagulation Profile: No results for input(s): INR, PROTIME in the last 168 hours. Cardiac Enzymes: No results for input(s): CKTOTAL, CKMB, CKMBINDEX, TROPONINI in the last 168 hours. BNP (last 3 results) No results for input(s): PROBNP in the last 8760 hours. HbA1C: No results for input(s): HGBA1C in the last 72 hours. CBG: No results for input(s): GLUCAP in the last 168 hours. Lipid Profile: Recent Labs    10/14/21 0418  TRIG 51   Thyroid Function Tests: No results for input(s): TSH, T4TOTAL, FREET4, T3FREE, THYROIDAB in the last 72 hours. Anemia Panel: No results for input(s): VITAMINB12, FOLATE, FERRITIN, TIBC, IRON, RETICCTPCT in the last 72 hours. Sepsis Labs: Recent Labs  Lab 10/08/21 0002  LATICACIDVEN 1.1     Recent Results (from the past 240 hour(s))  Blood culture (routine x 2)     Status: Abnormal (Preliminary result)    Collection Time: 10/08/21 12:02 AM   Specimen: BLOOD  Result Value Ref Range Status   Specimen Description   Final    BLOOD RIGHT PICC LINE Performed at [facility], [place] [address], [place], [place] [address]    Special Requests   Final    BOTTLES DRAWN AEROBIC AND ANAEROBIC Blood Culture adequate volume Performed at [facility] [address] [place], [place] [address]    Culture  Setup Time   Final    GRAM NEGATIVE RODS AEROBIC BOTTLE ONLY CRITICAL VALUE NOTED.  VALUE IS CONSISTENT WITH PREVIOUSLY REPORTED AND CALLED VALUE.    Culture (A)  Final    ACINETOBACTER URSINGII Sent to [place] for further susceptibility testing. Performed at [facility], [place] [address], [place], [place] [address]    Report Status PENDING  Incomplete  Blood culture (routine x 2)     Status: Abnormal   Collection Time: 10/08/21 12:02 AM   Specimen: BLOOD  Result Value Ref Range Status   Specimen Description   Final    BLOOD RIGHT PORTA CATH Performed at [facility] [place] [address], [place], [place] [address]    Special Requests   Final    BOTTLES DRAWN AEROBIC AND ANAEROBIC Blood Culture adequate volume Performed at [facility] [address] [place], [place] [address]    Culture  Setup Time   Final    GRAM NEGATIVE RODS IN BOTH AEROBIC AND ANAEROBIC BOTTLES CRITICAL RESULT CALLED TO, READ BACK BY AND VERIFIED WITH: DREW [facility] PHARMD @1808  10/08/21 EB Performed at [facility], [place] [address], [place], [place] [address]    Culture KLEBSIELLA PNEUMONIAE (A)  Final   Report Status 10/11/2021 FINAL  Final   Organism ID, Bacteria KLEBSIELLA PNEUMONIAE  Final      Susceptibility   Klebsiella pneumoniae - MIC*    AMPICILLIN >=32 RESISTANT Resistant     CEFAZOLIN <=4 SENSITIVE Sensitive     CEFEPIME <=0.12 SENSITIVE Sensitive     CEFTAZIDIME <=1 SENSITIVE Sensitive     CEFTRIAXONE <=0.25 SENSITIVE  Sensitive     CIPROFLOXACIN <=0.25 SENSITIVE Sensitive     GENTAMICIN <=1 SENSITIVE Sensitive     IMIPENEM <=0.25 SENSITIVE Sensitive     TRIMETH/SULFA <=20 SENSITIVE Sensitive     AMPICILLIN/SULBACTAM 4 SENSITIVE Sensitive     PIP/TAZO <=4 SENSITIVE Sensitive     * KLEBSIELLA PNEUMONIAE  Resp Panel by RT-PCR (Flu A&B, Covid) Nasopharyngeal Swab     Status: None   Collection Time: 10/08/21 12:02  AM   Specimen: Nasopharyngeal Swab; Nasopharyngeal(NP) swabs in vial transport medium  Result Value Ref Range Status   SARS Coronavirus 2 by RT PCR NEGATIVE NEGATIVE Final    Comment: (NOTE) SARS-CoV-2 target nucleic acids are NOT DETECTED.  The SARS-CoV-2 RNA is generally detectable in upper respiratory specimens during the acute phase of infection. The lowest concentration of SARS-CoV-2 viral copies this assay can detect is 138 copies/mL. A negative result does not preclude SARS-Cov-2 infection and should not be used as the sole basis for treatment or other patient management decisions. A negative result may occur with  improper specimen collection/handling, submission of specimen other than nasopharyngeal swab, presence of viral mutation(s) within the areas targeted by this assay, and inadequate number of viral copies(<138 copies/mL). A negative result must be combined with clinical observations, patient history, and epidemiological information. The expected result is Negative.  Fact Sheet for Patients:  EntrepreneurPulse.com.au  Fact Sheet for Healthcare Providers:  IncredibleEmployment.be  This test is no t yet approved or cleared by the [place] FDA and  has been authorized for detection and/or diagnosis of SARS-CoV-2 by FDA under an Emergency Use Authorization (EUA). This EUA will remain  in effect (meaning this test can be used) for the duration of the COVID-19 declaration under Section 564(b)(1) of the Act, 21 U.S.C.section  360bbb-3(b)(1), unless the authorization is terminated  or revoked sooner.       Influenza A by PCR NEGATIVE NEGATIVE Final   Influenza B by PCR NEGATIVE NEGATIVE Final    Comment: (NOTE) The Xpert Xpress SARS-CoV-2/FLU/RSV plus assay is intended as an aid in the diagnosis of influenza from Nasopharyngeal swab specimens and should not be used as a sole basis for treatment. Nasal washings and aspirates are unacceptable for Xpert Xpress SARS-CoV-2/FLU/RSV testing.  Fact Sheet for Patients: EntrepreneurPulse.com.au  Fact Sheet for Healthcare Providers: IncredibleEmployment.be  This test is not yet approved or cleared by the [place] FDA and has been authorized for detection and/or diagnosis of SARS-CoV-2 by FDA under an Emergency Use Authorization (EUA). This EUA will remain in effect (meaning this test can be used) for the duration of the COVID-19 declaration under Section 564(b)(1) of the Act, 21 U.S.C. section 360bbb-3(b)(1), unless the authorization is terminated or revoked.  Performed at [facility], [place] [address], [place], [place] [address]   Blood Culture ID Panel (Reflexed)     Status: Abnormal   Collection Time: 10/08/21 12:02 AM  Result Value Ref Range Status   Enterococcus faecalis NOT DETECTED NOT DETECTED Final   Enterococcus Faecium NOT DETECTED NOT DETECTED Final   Listeria monocytogenes NOT DETECTED NOT DETECTED Final   Staphylococcus species NOT DETECTED NOT DETECTED Final   Staphylococcus aureus (BCID) NOT DETECTED NOT DETECTED Final   Staphylococcus epidermidis NOT DETECTED NOT DETECTED Final   Staphylococcus lugdunensis NOT DETECTED NOT DETECTED Final   Streptococcus species NOT DETECTED NOT DETECTED Final   Streptococcus agalactiae NOT DETECTED NOT DETECTED Final   Streptococcus pneumoniae NOT DETECTED NOT DETECTED Final   Streptococcus pyogenes NOT DETECTED NOT DETECTED Final    A.calcoaceticus-baumannii NOT DETECTED NOT DETECTED Final   Bacteroides fragilis NOT DETECTED NOT DETECTED Final   Enterobacterales DETECTED (A) NOT DETECTED Final    Comment: Enterobacterales represent a large order of gram negative bacteria, not a single organism. CRITICAL RESULT CALLED TO, READ BACK BY AND VERIFIED WITH: DREW [facility] PHARMD @1808  10/08/21 EB    Enterobacter cloacae complex NOT DETECTED NOT DETECTED Final  Escherichia coli NOT DETECTED NOT DETECTED Final   Klebsiella aerogenes NOT DETECTED NOT DETECTED Final   Klebsiella oxytoca NOT DETECTED NOT DETECTED Final   Klebsiella pneumoniae DETECTED (A) NOT DETECTED Final    Comment: CRITICAL RESULT CALLED TO, READ BACK BY AND VERIFIED WITH: DREW WOFFORD PHARMD @1808  10/08/21 EB    Proteus species NOT DETECTED NOT DETECTED Final   Salmonella species NOT DETECTED NOT DETECTED Final   Serratia marcescens NOT DETECTED NOT DETECTED Final   Haemophilus influenzae NOT DETECTED NOT DETECTED Final   Neisseria meningitidis NOT DETECTED NOT DETECTED Final   Pseudomonas aeruginosa NOT DETECTED NOT DETECTED Final   Stenotrophomonas maltophilia NOT DETECTED NOT DETECTED Final   Candida albicans NOT DETECTED NOT DETECTED Final   Candida auris NOT DETECTED NOT DETECTED Final   Candida glabrata NOT DETECTED NOT DETECTED Final   Candida krusei NOT DETECTED NOT DETECTED Final   Candida parapsilosis NOT DETECTED NOT DETECTED Final   Candida tropicalis NOT DETECTED NOT DETECTED Final   Cryptococcus neoformans/gattii NOT DETECTED NOT DETECTED Final   CTX-M ESBL NOT DETECTED NOT DETECTED Final   Carbapenem resistance IMP NOT DETECTED NOT DETECTED Final   Carbapenem resistance KPC NOT DETECTED NOT DETECTED Final   Carbapenem resistance NDM NOT DETECTED NOT DETECTED Final   Carbapenem resist OXA 48 LIKE NOT DETECTED NOT DETECTED Final   Carbapenem resistance VIM NOT DETECTED NOT DETECTED Final    Comment: Performed at [facility],  [address], [place], [place] [address]  Culture, blood (routine x 2)     Status: None (Preliminary result)   Collection Time: 10/09/21 11:33 AM   Specimen: BLOOD RIGHT HAND  Result Value Ref Range Status   Specimen Description   Final    BLOOD RIGHT HAND Performed at [facility] [address], [place], [place] [address]    Special Requests   Final    BOTTLES DRAWN AEROBIC ONLY Blood Culture results may not be optimal due to an inadequate volume of blood received in culture bottles Performed at [facility] [address] [place], [place] [address]    Culture   Final    NO GROWTH 4 DAYS Performed at [facility], [place] [address] [place], [place] [address]    Report Status PENDING  Incomplete  Culture, blood (routine x 2)     Status: None (Preliminary result)   Collection Time: 10/09/21 12:24 PM   Specimen: BLOOD  Result Value Ref Range Status   Specimen Description   Final    BLOOD PICC LINE Performed at [facility], [place] [address], [place], [place] [address]    Special Requests   Final    BOTTLES DRAWN AEROBIC AND ANAEROBIC Blood Culture results may not be optimal due to an inadequate volume of blood received in culture bottles Performed at [facility] [address] [place], [place] [address]    Culture   Final    NO GROWTH 4 DAYS Performed at [facility], [place] [address] [place], [place] [address]    Report Status PENDING  Incomplete  Culture, blood (routine x 2)     Status: None (Preliminary result)   Collection Time: 10/12/21  9:33 AM   Specimen: BLOOD RIGHT HAND  Result Value Ref Range Status   Specimen Description   Final    BLOOD RIGHT HAND Performed at [facility] [address] [place], [place] [address]  Special Requests   Final    BOTTLES DRAWN AEROBIC ONLY Blood Culture adequate volume Performed at [facility]  [address] [place], [place] [address]    Culture   Final    NO GROWTH 1 DAY Performed at [facility], [place] [address] [place], [place] [address]    Report Status PENDING  Incomplete  Culture, blood (routine x 2)     Status: None (Preliminary result)   Collection Time: 10/12/21 11:13 AM   Specimen: BLOOD  Result Value Ref Range Status   Specimen Description   Final    BLOOD BLOOD RIGHT FOREARM Performed at [facility] [address], [place], [place] [address]    Special Requests   Final    BOTTLES DRAWN AEROBIC AND ANAEROBIC Blood Culture adequate volume Performed at [facility] [address] [place], [place] [address]    Culture   Final    NO GROWTH 1 DAY Performed at [facility], [place] [address] [place],  [address]    Report Status PENDING  Incomplete     Radiology Studies: No results found.  Scheduled Meds:  amLODipine  10 mg Oral Daily   calcium carbonate  1 tablet Oral Q breakfast   carvedilol  12.5 mg Oral BID   Chlorhexidine Gluconate Cloth  6 each Topical Daily   cholecalciferol  1,000 Units Oral Daily   ciprofloxacin 0.2 mg/ml + heparin 5000 units/ml  2.5 mL Intracatheter Q1200   ciprofloxacin 0.2 mg/ml + heparin 5000 units/ml  2.5 mL Intracatheter Q1200   cycloSPORINE  1 drop Both Eyes BID   enoxaparin (LOVENOX) injection  40 mg Subcutaneous Q24H   estradiol  2 mg Oral Daily   feeding supplement  1 Container Oral BID BM   hydrALAZINE  100 mg Oral TID   levETIRAcetam  500 mg Oral BID   lipase/protease/amylase  36,000 Units Oral TID WC   methadone  5 mg Oral 5 X Daily   methocarbamol  500 mg Oral BID   multivitamin-iron-minerals-folic acid   Oral Daily   pantoprazole  40 mg Oral Daily   sucralfate  1 g Oral BID   Teduglutide (rDNA)  1.5 mg Subcutaneous Daily   vitamin B-12  1,000 mcg Oral Daily   Continuous Infusions:  levofloxacin (LEVAQUIN) IV 500 mg (10/13/21 1007)     LOS: 6 days     Time spent: 7mn  Zelene Barga C Shant Hence, DO  [facility] Available via Epic secure chat 7am-7pm After these hours, please refer to coverage provider listed on [email or web address] 10/14/2021, 7:28 AM

## 2021-10-15 LAB — BASIC METABOLIC PANEL
Anion gap: 5 (ref 5–15)
Anion gap: 4 — ABNORMAL LOW (ref 5–15)
BUN: 24 mg/dL — ABNORMAL HIGH (ref 6–20)
BUN: 23 mg/dL — ABNORMAL HIGH (ref 6–20)
CO2: 23 mmol/L (ref 22–32)
CO2: 23 mmol/L (ref 22–32)
Calcium: 9.1 mg/dL (ref 8.9–10.3)
Calcium: 9.1 mg/dL (ref 8.9–10.3)
Chloride: 108 mmol/L (ref 98–111)
Chloride: 111 mmol/L (ref 98–111)
Creatinine, Ser: 1.84 mg/dL — ABNORMAL HIGH (ref 0.44–1.00)
Creatinine, Ser: 1.8 mg/dL — ABNORMAL HIGH (ref 0.44–1.00)
GFR, Estimated: 31 mL/min — ABNORMAL LOW (ref >60)
GFR, Estimated: 32 mL/min — ABNORMAL LOW (ref >60)
Glucose, Bld: 113 mg/dL — ABNORMAL HIGH (ref 70–99)
Glucose, Bld: 100 mg/dL — ABNORMAL HIGH (ref 70–99)
Potassium: 4 mmol/L (ref 3.5–5.1)
Potassium: 4 mmol/L (ref 3.5–5.1)
Sodium: 136 mmol/L (ref 135–145)
Sodium: 138 mmol/L (ref 135–145)

## 2021-10-15 LAB — CBC
HCT: 26 % — ABNORMAL LOW (ref 36.0–46.0)
Hemoglobin: 8.5 g/dL — ABNORMAL LOW (ref 12.0–15.0)
MCH: 31.4 pg (ref 26.0–34.0)
MCHC: 32.7 g/dL (ref 30.0–36.0)
MCV: 95.9 fL (ref 80.0–100.0)
Platelets: 172 10*3/uL (ref 150–400)
RBC: 2.71 MIL/uL — ABNORMAL LOW (ref 3.87–5.11)
RDW: 14.5 % (ref 11.5–15.5)
WBC: 6.7 10*3/uL (ref 4.0–10.5)
nRBC: 0 % (ref 0.0–0.2)

## 2021-10-15 LAB — GLUCOSE, CAPILLARY
Glucose-Capillary: 101 mg/dL — ABNORMAL HIGH (ref 70–99)
Glucose-Capillary: 107 mg/dL — ABNORMAL HIGH (ref 70–99)
Glucose-Capillary: 105 mg/dL — ABNORMAL HIGH (ref 70–99)
Glucose-Capillary: 100 mg/dL — ABNORMAL HIGH (ref 70–99)

## 2021-10-15 LAB — SUSCEPTIBILITY, AER + ANAEROB: Source of Sample: 8680

## 2021-10-15 LAB — MAGNESIUM: Magnesium: 2 mg/dL (ref 1.7–2.4)

## 2021-10-15 LAB — SUSCEPTIBILITY RESULT

## 2021-10-15 LAB — PHOSPHORUS: Phosphorus: 4.4 mg/dL (ref 2.5–4.6)

## 2021-10-15 MED ORDER — SODIUM CHLORIDE 0.9 % IV SOLN: INTRAVENOUS | Status: DC | PRN

## 2021-10-15 MED ORDER — ADULT MULTIVITAMIN W/MINERALS CH
1.0000 | ORAL_TABLET | Freq: Every day | ORAL | Status: DC
  Administered 2021-10-15: 1 via ORAL
  Filled 2021-10-15 (×2): qty 1

## 2021-10-15 MED ORDER — TRAVASOL 10 % IV SOLN
INTRAVENOUS | Status: AC
  Filled 2021-10-15: qty 900

## 2021-10-15 NOTE — Progress Notes (Signed)
PHARMACY - TOTAL PARENTERAL NUTRITION CONSULT NOTE   Indication:  short bowel syndrome, chronic TPN  Patient Measurements: Height: 5' 8"  (172.7 cm) Weight: 57.4 kg (126 lb 8.7 oz) IBW/kg (Calculated) : 63.9 TPN AdjBW (KG): 67.8 Body mass index is 19.24 kg/m.  Assessment: 60 year old female on chronic TPN followed by [facility] for short gut syndrome.  Currently being treated for PICC line infection.  History of recurrent line infections and difficulty with replacing central line. Per ID, ok to resume TPN.   Glucose / Insulin: CBGs < 150, no SSI given/24 hours Electrolytes: elytes wnl Renal: Scr elevated but stable at 1.84 (BL ~1.4), BUN inc Hepatic: LFTs and alk phos elevated on admit > alk phos now wnl, AST now wnl, ALT still elevated but downtrending Intake / Output; MIVF: LBM 11/19, no mIVF, poor PO intake yesterday per MD GI Imaging: - 11/13: few distended loops of small bowel in abdomen measuring up to 3.3 cm, similar appearance to the prior exam > patulous bowel vs SBO GI Surgeries / Procedures:  - none this admit  Central access: Prior double-lumen PICC line in place TPN start date: begin inpatient 11/19, continued from PTA  Home TPN regimen from [facility]:  - 1864m over 12hrs daily  - Provides 85 grams per day, 1088 kcal per day 4x/week - 20% SMOF lipids added 3x/week provides 85 grams protein per day, 1688.    Nutritional Goals: Goal TPN rate provides 90 g of protein and 1818 kcals per day  RD Assessment: Estimated Needs Total Energy Estimated Needs: 1900-2100 kcal Total Protein Estimated Needs: 90-105 grams Total Fluid Estimated Needs: >/= 2 L/day  Current Nutrition:  Regular diet and TPN  Plan:  Will cycle TPN at home rate over 12 hours using current RD recommendations for inpatient Increase TPN to full rate (1800 mL/day) over 12 hours at 1800 Start rate at 82 mL/hr for 1 hours Increase rate to 164 mL/hr for 10 hours. Decrease rate to 82 mL/hr for 1 hours,  then stop. Electrolytes in TPN:  Na 524m/L K 3086mL Ca 4mE33m Mg 5mEq9mPhos 7mmol96mCl:Ac 1:1 Add trace elements to TPN Remove MVI from TPN as patient is taking PO Initiate Sensitive SSI TID AC and QHS - adjust as needed  MIVF per MD Monitor TPN labs on Mon/Thurs Discussed w/ IV team RN  - will receive cycle TPN over 12 hours, and antibiotic lock when TPN is not running.    Caitlin NanasmD 10/15/2021 9:05 AM

## 2021-10-15 NOTE — Progress Notes (Signed)
PROGRESS NOTE    Caitlin Peters  GYI:[id number] DOB: 11-06-1961 DOA: 10/07/2021 PCP: [name], [name], MD    Brief Narrative:  Caitlin Peters is a 60 year old female with past medical history significant for Crohn's disease, short bowel syndrome, severe protein calorie malnutrition on TPN since 2003 via right thigh PICC line, essential hypertension, GERD, anxiety/depression, seizure disorder who presents to [facility] ED on 11/12 with fever.  Patient reports day prior, returned from a walk and started having crampy abdominal pain and throbbing in her low back.  She noted she had a fever of 102.0 F at home and took Tylenol.  Due to concern for recurrent history of infections, she called EMS and was brought to the ED for further evaluation.  Assessment & Plan:   Klebsiella pneumonia septicemia, POA Concurrent Acinetobacter infection, POA - Recurrent history of CLABSI 2/2 chronic PICC line use for TPN.   - Repeat cultures here initially remarkable for Kelbsiella, now showing acinetobacter as well - ID following - continue PICC lock w/ abx - current regimen levaquin/cipro - Repeat cultures preliminary negative - reassuring for infection control - Continue to hold all superfluous IV meds/fluids to properly treat line infection per ID/pharmacy discussion  Elevated LFTs, resolving - Possibly secondary to disseminated infection - PRN tylenol use at home but seems appropriate dose/schedule -AST/ALT/Bili downtrending appropriately -no longer following  Abdominal pain, resolved Patulous bowel vs early/partial small bowel obstruction - CT abdomen/pelvis: few distended small bowel loops similar in appearance to partial/early SBO - no difficulty with BM per patient today  Hypokalemia Continue to replete in TPN as necessary given short gut syndrome - somewhat difficult given ongoing need for abx/lock via PICC line  Essential hypertension - Continue home carvedilol/amlodipine/hydralazine as  scheduled  Seizure Disorder: Keppra 500 mg p.o. twice daily  Short-bowel syndrome - Teduglutide 1.83m [place] daily - Strict I's and O's - Resume TPN in the next 24 to 48 hours, tolerating p.o. quite poorly as of yesterday  Pancreatic insufficiency: - Creon 36,000 units 3 times daily  Chronic pain syndrome:  --Methadone 5 mg 5x daily --Robaxin 5 1 mg p.o. twice daily --Dilaudid transitioned back to PO route given need to limit PICC access due to ongoing infection and continued abx lock  GERD:  --Protonix 40 mg p.o. daily --Carafate 1 g p.o. twice daily --Famotidine 20 mg p.o. daily as needed heartburn/indigestion  CKD stage IIIb - Creatinine near baseline and stable - avoid IV fluids unless able to bolus prior to abx lock per discussion with ID/Pharmacy  Chronic normocytic/iron deficiency anemia Baseline hemoglobin 7-9 over the past few months.  Hemoglobin 8.4, stable.  Chronic thrombocytopenia Etiology likely secondary to chronic illness/malnutrition.  Platelet count stable.  Moderate protein calorie malnutrition Etiology likely secondary to underlying Crohn's disease with history of short bowel syndrome.  Has been on TPN since 2003. Body mass index is 19.24 kg/m. Nutrition Status: Nutrition Problem: Moderate Malnutrition Etiology: chronic illness (short bowel syndrome) Signs/Symptoms: mild fat depletion, mild muscle depletion, moderate muscle depletion Interventions: Boost Breeze, MVI --Holding TPN for now given septicemia as above --Regular diet --Dietitian following   DVT prophylaxis: enoxaparin (LOVENOX) injection 30 mg Start: 10/14/21 1800   Code Status: Full Code Family Communication: No family present at bedside this morning  Disposition Plan:  Level of care: Med-Surg Status is: Inpatient  Remains inpatient appropriate because: IV antibiotics, awaiting blood culture susceptibilities and repeat blood cultures, anticipate discharge 2-3 days    Consultants:   Infectious disease,  Dr. Gale Journey  Procedures:  None  Antimicrobials:  Vancomycin 11/13 - 11/14 Meropenem 11/13 - 11/14 Ceftriaxone 11/14 - 11/16 Levaquin/Cipro 11/16 >> ongoing   Subjective: No acute issues or events overnight, some abdominal pain and bloating in the setting of increased p.o. intake but generally resolving, otherwise denies headache fever chills chest pain nausea vomiting diarrhea or constipation.  Objective: Vitals:   10/14/21 1414 10/14/21 2135 10/14/21 2233 10/15/21 0600  BP: (!) 149/71 (!) 143/59  (!) 167/66  Pulse: 70 (!) 48 78 77  Resp: 18 16  15   Temp: 98.6 F (37 C) 98.3 F (36.8 C)  98.6 F (37 C)  TempSrc: Oral     SpO2: 100% 100%  100%  Weight:    57.4 kg  Height:        Intake/Output Summary (Last 24 hours) at 10/15/2021 0736 Last data filed at 10/15/2021 0600 Gross per 24 hour  Intake 1211.22 ml  Output --  Net 1211.22 ml    Filed Weights   10/08/21 0636 10/13/21 1438 10/15/21 0600  Weight: 67.8 kg 53.3 kg 57.4 kg    Examination:  General exam: Appears calm and comfortable  Respiratory system: Clear to auscultation. Respiratory effort normal.  On room air Cardiovascular system: S1 & S2 heard, RRR. No JVD, murmurs, rubs, gallops or clicks. No pedal edema. Gastrointestinal system: Abdomen is nondistended, soft and nontender. No organomegaly or masses felt. Normal bowel sounds heard. Central nervous system: Alert and oriented. No focal neurological deficits. Extremities: Symmetric 5 x 5 power.,  Right thigh PICC line noted without erythema/fluctuance/purulance Skin: No rashes, lesions or ulcers Psychiatry: Judgement and insight appear normal. Mood & affect appropriate.   Data Reviewed: I have personally reviewed following labs and imaging studies  CBC: Recent Labs  Lab 10/09/21 0842 10/12/21 1113 10/13/21 0930 10/14/21 1438  WBC 3.8* 3.4* 3.3* 3.2*  HGB 8.4* 9.0* 8.8* 8.5*  HCT 26.3* 26.2* 26.6* 25.9*  MCV 98.1 92.9 94.3 95.2   PLT PLATELET CLUMPS NOTED ON SMEAR, UNABLE TO ESTIMATE 164 162 707    Basic Metabolic Panel: Recent Labs  Lab 10/11/21 0428 10/12/21 1113 10/13/21 0930 10/14/21 0418 10/15/21 0419  NA 138 138 136 137 136  K 3.8 3.4* 3.3* 4.2 4.0  CL 107 105 105 106 108  CO2 24 26 24 23 23   GLUCOSE 95 106* 156* 96 113*  BUN 13 11 14 19  24*  CREATININE 1.29* 1.44* 1.85* 1.82* 1.84*  CALCIUM 8.8* 9.1 8.9 9.2 9.1  MG 1.9  --   --  2.0 2.0  PHOS  --   --   --  3.9 4.4    GFR: Estimated Creatinine Clearance: 29.5 mL/min (A) (by C-G formula based on SCr of 1.84 mg/dL (H)). Liver Function Tests: Recent Labs  Lab 10/09/21 0842 10/10/21 0426 10/11/21 0428  AST 93* 37 23  ALT 241* 154* 114*  ALKPHOS 150* 128* 124  BILITOT 0.9 0.5 0.7  PROT 5.7* 5.5* 5.8*  ALBUMIN 2.6* 2.3* 2.5*    No results for input(s): LIPASE, AMYLASE in the last 168 hours.  No results for input(s): AMMONIA in the last 168 hours. Coagulation Profile: No results for input(s): INR, PROTIME in the last 168 hours. Cardiac Enzymes: No results for input(s): CKTOTAL, CKMB, CKMBINDEX, TROPONINI in the last 168 hours. BNP (last 3 results) No results for input(s): PROBNP in the last 8760 hours. HbA1C: No results for input(s): HGBA1C in the last 72 hours. CBG: Recent Labs  Lab 10/14/21 1800  10/14/21 2231  GLUCAP 93 123*   Lipid Profile: Recent Labs    10/14/21 0418  TRIG 51    Thyroid Function Tests: No results for input(s): TSH, T4TOTAL, FREET4, T3FREE, THYROIDAB in the last 72 hours. Anemia Panel: No results for input(s): VITAMINB12, FOLATE, FERRITIN, TIBC, IRON, RETICCTPCT in the last 72 hours. Sepsis Labs: No results for input(s): PROCALCITON, LATICACIDVEN in the last 168 hours.   Recent Results (from the past 240 hour(s))  Blood culture (routine x 2)     Status: Abnormal (Preliminary result)   Collection Time: 10/08/21 12:02 AM   Specimen: BLOOD  Result Value Ref Range Status   Specimen Description    Final    BLOOD RIGHT PICC LINE Performed at [facility], [place] [address], [place], [place] [address]    Special Requests   Final    BOTTLES DRAWN AEROBIC AND ANAEROBIC Blood Culture adequate volume Performed at [place] [address], [place], [place] [address]    Culture  Setup Time   Final    GRAM NEGATIVE RODS AEROBIC BOTTLE ONLY CRITICAL VALUE NOTED.  VALUE IS CONSISTENT WITH PREVIOUSLY REPORTED AND CALLED VALUE.    Culture (A)  Final    ACINETOBACTER URSINGII Sent to [place] for further susceptibility testing. Performed at [facility], [place] [address] [place], [place] [address]    Report Status PENDING  Incomplete  Blood culture (routine x 2)     Status: Abnormal   Collection Time: 10/08/21 12:02 AM   Specimen: BLOOD  Result Value Ref Range Status   Specimen Description   Final    BLOOD RIGHT PORTA CATH Performed at [place] [address] [place], [place] [address]    Special Requests   Final    BOTTLES DRAWN AEROBIC AND ANAEROBIC Blood Culture adequate volume Performed at [place] [address] [place], [name] [address]    Culture  Setup Time   Final    GRAM NEGATIVE RODS IN BOTH AEROBIC AND ANAEROBIC BOTTLES CRITICAL RESULT CALLED TO, READ BACK BY AND VERIFIED WITH: DREW [facility] PHARMD @1808  10/08/21 EB Performed at [place] [address], [place], [place] [address]    Culture KLEBSIELLA PNEUMONIAE (A)  Final   Report Status 10/11/2021 FINAL  Final   Organism ID, Bacteria KLEBSIELLA PNEUMONIAE  Final      Susceptibility   Klebsiella pneumoniae - MIC*    AMPICILLIN >=32 RESISTANT Resistant     CEFAZOLIN <=4 SENSITIVE Sensitive     CEFEPIME <=0.12 SENSITIVE Sensitive     CEFTAZIDIME <=1 SENSITIVE Sensitive     CEFTRIAXONE <=0.25 SENSITIVE Sensitive     CIPROFLOXACIN <=0.25 SENSITIVE Sensitive     GENTAMICIN <=1 SENSITIVE Sensitive     IMIPENEM  <=0.25 SENSITIVE Sensitive     TRIMETH/SULFA <=20 SENSITIVE Sensitive     AMPICILLIN/SULBACTAM 4 SENSITIVE Sensitive     PIP/TAZO <=4 SENSITIVE Sensitive     * KLEBSIELLA PNEUMONIAE  Resp Panel by RT-PCR (Flu A&B, Covid) Nasopharyngeal Swab     Status: None   Collection Time: 10/08/21 12:02 AM   Specimen: Nasopharyngeal Swab; Nasopharyngeal(NP) swabs in vial transport medium  Result Value Ref Range Status   SARS Coronavirus 2 by RT PCR NEGATIVE NEGATIVE Final    Comment: (NOTE) SARS-CoV-2 target nucleic acids are NOT DETECTED.  The SARS-CoV-2 RNA is generally detectable in upper respiratory specimens during the acute phase of infection. The lowest concentration of  SARS-CoV-2 viral copies this assay can detect is 138 copies/mL. A negative result does not preclude SARS-Cov-2 infection and should not be used as the sole basis for treatment or other patient management decisions. A negative result may occur with  improper specimen collection/handling, submission of specimen other than nasopharyngeal swab, presence of viral mutation(s) within the areas targeted by this assay, and inadequate number of viral copies(<138 copies/mL). A negative result must be combined with clinical observations, patient history, and epidemiological information. The expected result is Negative.  Fact Sheet for Patients:  EntrepreneurPulse.com.au  Fact Sheet for Healthcare Providers:  IncredibleEmployment.be  This test is no t yet approved or cleared by the [place] FDA and  has been authorized for detection and/or diagnosis of SARS-CoV-2 by FDA under an Emergency Use Authorization (EUA). This EUA will remain  in effect (meaning this test can be used) for the duration of the COVID-19 declaration under Section 564(b)(1) of the Act, 21 U.S.C.section 360bbb-3(b)(1), unless the authorization is terminated  or revoked sooner.       Influenza A by PCR NEGATIVE NEGATIVE  Final   Influenza B by PCR NEGATIVE NEGATIVE Final    Comment: (NOTE) The Xpert Xpress SARS-CoV-2/FLU/RSV plus assay is intended as an aid in the diagnosis of influenza from Nasopharyngeal swab specimens and should not be used as a sole basis for treatment. Nasal washings and aspirates are unacceptable for Xpert Xpress SARS-CoV-2/FLU/RSV testing.  Fact Sheet for Patients: EntrepreneurPulse.com.au  Fact Sheet for Healthcare Providers: IncredibleEmployment.be  This test is not yet approved or cleared by the [place] FDA and has been authorized for detection and/or diagnosis of SARS-CoV-2 by FDA under an Emergency Use Authorization (EUA). This EUA will remain in effect (meaning this test can be used) for the duration of the COVID-19 declaration under Section 564(b)(1) of the Act, 21 U.S.C. section 360bbb-3(b)(1), unless the authorization is terminated or revoked.  Performed at [facility], [place] [address], [place], [place] [address]   Blood Culture ID Panel (Reflexed)     Status: Abnormal   Collection Time: 10/08/21 12:02 AM  Result Value Ref Range Status   Enterococcus faecalis NOT DETECTED NOT DETECTED Final   Enterococcus Faecium NOT DETECTED NOT DETECTED Final   Listeria monocytogenes NOT DETECTED NOT DETECTED Final   Staphylococcus species NOT DETECTED NOT DETECTED Final   Staphylococcus aureus (BCID) NOT DETECTED NOT DETECTED Final   Staphylococcus epidermidis NOT DETECTED NOT DETECTED Final   Staphylococcus lugdunensis NOT DETECTED NOT DETECTED Final   Streptococcus species NOT DETECTED NOT DETECTED Final   Streptococcus agalactiae NOT DETECTED NOT DETECTED Final   Streptococcus pneumoniae NOT DETECTED NOT DETECTED Final   Streptococcus pyogenes NOT DETECTED NOT DETECTED Final   A.calcoaceticus-baumannii NOT DETECTED NOT DETECTED Final   Bacteroides fragilis NOT DETECTED NOT DETECTED Final   Enterobacterales  DETECTED (A) NOT DETECTED Final    Comment: Enterobacterales represent a large order of gram negative bacteria, not a single organism. CRITICAL RESULT CALLED TO, READ BACK BY AND VERIFIED WITH: DREW WOFFORD PHARMD @1808  10/08/21 EB    Enterobacter cloacae complex NOT DETECTED NOT DETECTED Final   Escherichia coli NOT DETECTED NOT DETECTED Final   Klebsiella aerogenes NOT DETECTED NOT DETECTED Final   Klebsiella oxytoca NOT DETECTED NOT DETECTED Final   Klebsiella pneumoniae DETECTED (A) NOT DETECTED Final    Comment: CRITICAL RESULT CALLED TO, READ BACK BY AND VERIFIED WITH: DREW WOFFORD PHARMD @1808  10/08/21 EB    Proteus species NOT DETECTED NOT  DETECTED Final   Salmonella species NOT DETECTED NOT DETECTED Final   Serratia marcescens NOT DETECTED NOT DETECTED Final   Haemophilus influenzae NOT DETECTED NOT DETECTED Final   Neisseria meningitidis NOT DETECTED NOT DETECTED Final   Pseudomonas aeruginosa NOT DETECTED NOT DETECTED Final   Stenotrophomonas maltophilia NOT DETECTED NOT DETECTED Final   Candida albicans NOT DETECTED NOT DETECTED Final   Candida auris NOT DETECTED NOT DETECTED Final   Candida glabrata NOT DETECTED NOT DETECTED Final   Candida krusei NOT DETECTED NOT DETECTED Final   Candida parapsilosis NOT DETECTED NOT DETECTED Final   Candida tropicalis NOT DETECTED NOT DETECTED Final   Cryptococcus neoformans/gattii NOT DETECTED NOT DETECTED Final   CTX-M ESBL NOT DETECTED NOT DETECTED Final   Carbapenem resistance IMP NOT DETECTED NOT DETECTED Final   Carbapenem resistance KPC NOT DETECTED NOT DETECTED Final   Carbapenem resistance NDM NOT DETECTED NOT DETECTED Final   Carbapenem resist OXA 48 LIKE NOT DETECTED NOT DETECTED Final   Carbapenem resistance VIM NOT DETECTED NOT DETECTED Final    Comment: Performed at [facility], [address], [place], [place] [address]  Susceptibility, Aer + Anaerob     Status: Abnormal   Collection Time: 10/08/21 12:02 AM   Result Value Ref Range Status   Suscept, Aer + Anaerob Preliminary report (A)  Final    Comment: (NOTE) Performed At: [facility] [place], [place] [id number] Rush Farmer MD AV:[id number]    Source of Sample [phone] BLD SENSI GNT Aci.ursingii  Final    Comment: Performed at [facility], [place] [address] [place], [place] [address]  Susceptibility Result     Status: Abnormal   Collection Time: 10/08/21 12:02 AM  Result Value Ref Range Status   Suscept Result 1 Comment (A)  Final    Comment: (NOTE) Acinetobacter ursingii Identification performed by account, not confirmed by this laboratory. Performed At: [facility] [place], [place] [id number] Rush Farmer MD OF:[id number]   Culture, blood (routine x 2)     Status: None   Collection Time: 10/09/21 11:33 AM   Specimen: BLOOD RIGHT HAND  Result Value Ref Range Status   Specimen Description   Final    BLOOD RIGHT HAND Performed at [facility], [place] [address] [place], [place] [address]    Special Requests   Final    BOTTLES DRAWN AEROBIC ONLY Blood Culture results may not be optimal due to an inadequate volume of blood received in culture bottles Performed at [place] [address] [place], [place] [address]    Culture   Final    NO GROWTH 5 DAYS Performed at [facility], [place] [address], [place], [place] [address]    Report Status 10/14/2021 FINAL  Final  Culture, blood (routine x 2)     Status: None   Collection Time: 10/09/21 12:24 PM   Specimen: BLOOD  Result Value Ref Range Status   Specimen Description   Final    BLOOD PICC LINE Performed at [facility], [place] [address], [place], [place] [address]    Special Requests   Final    BOTTLES DRAWN AEROBIC AND ANAEROBIC Blood Culture results may not be optimal due to an inadequate volume of blood received in culture bottles Performed at [place] [address], [place], [place] [address]    Culture   Final    NO GROWTH  5 DAYS Performed at [facility], [place] [address], [place], [place] [address]    Report Status 10/14/2021 FINAL  Final  Culture, blood (routine x 2)     Status: None (Preliminary result)   Collection Time: 10/12/21  9:33 AM   Specimen: BLOOD RIGHT HAND  Result Value Ref Range Status   Specimen Description   Final    BLOOD RIGHT HAND Performed at [place] [address] [place], [place] [address]    Special Requests   Final    BOTTLES DRAWN AEROBIC ONLY Blood Culture adequate volume Performed at [place] [address] [place], [place] [address]    Culture   Final    NO GROWTH 2 DAYS Performed at [place] [address] [place], [place] [address]    Report Status PENDING  Incomplete  Culture, blood (routine x 2)     Status: None (Preliminary result)   Collection Time: 10/12/21 11:13 AM   Specimen: BLOOD  Result Value Ref Range Status   Specimen Description   Final    BLOOD BLOOD RIGHT FOREARM Performed at [place] [address] [place], [place] [address]    Special Requests   Final    BOTTLES DRAWN AEROBIC AND ANAEROBIC Blood Culture adequate volume Performed at [place] [address] [place], [place] [address]    Culture   Final    NO GROWTH 2 DAYS Performed at [place] [address] [place], [place] [address]    Report Status PENDING  Incomplete     Radiology Studies: No results found.  Scheduled Meds:  amLODipine  10 mg Oral Daily   calcium carbonate  1 tablet Oral Q breakfast   carvedilol  12.5 mg Oral BID   Chlorhexidine Gluconate Cloth  6 each Topical Daily   cholecalciferol  1,000 Units Oral Daily   ciprofloxacin 0.2 mg/ml + heparin 5000 units/ml  2.5 mL Intracatheter Q1200   ciprofloxacin 0.2 mg/ml + heparin 5000 units/ml  2.5 mL  Intracatheter Q1200   cycloSPORINE  1 drop Both Eyes BID   enoxaparin (LOVENOX) injection  30 mg Subcutaneous Q24H   estradiol  2 mg Oral Daily   feeding supplement  1 Container Oral BID BM   hydrALAZINE  100 mg Oral TID   insulin aspart  0-5 Units Subcutaneous QHS   insulin aspart  0-9 Units Subcutaneous TID WC   levETIRAcetam  500 mg Oral BID   lipase/protease/amylase  36,000 Units Oral TID WC   methadone  5 mg Oral 5 X Daily   methocarbamol  500 mg Oral BID   multivitamin-iron-minerals-folic acid   Oral Daily   pantoprazole  40 mg Oral Daily   sucralfate  1 g Oral BID   Teduglutide (rDNA)  1.5 mg Subcutaneous Daily   vitamin B-12  1,000 mcg Oral Daily   Continuous Infusions:  levofloxacin (LEVAQUIN) IV 500 mg (10/14/21 1154)     LOS: 7 days    Time spent: 51mn  Jakayden Cancio C Manilla Strieter, DO  [facility] Available via Epic secure chat 7am-7pm After these hours, please refer to coverage provider listed on [email or web address] 10/15/2021, 7:36 AM

## 2021-10-15 NOTE — Plan of Care (Signed)

## 2021-10-15 NOTE — Plan of Care (Signed)
  Problem: Health Behavior/Discharge Planning: Goal: Ability to manage health-related needs will improve Outcome: Progressing   

## 2021-10-16 ENCOUNTER — Other Ambulatory Visit (HOSPITAL_COMMUNITY): Payer: Self-pay

## 2021-10-16 LAB — COMPREHENSIVE METABOLIC PANEL
ALT: 62 U/L — ABNORMAL HIGH (ref 0–44)
AST: 40 U/L (ref 15–41)
Albumin: 2.8 g/dL — ABNORMAL LOW (ref 3.5–5.0)
Alkaline Phosphatase: 130 U/L — ABNORMAL HIGH (ref 38–126)
Anion gap: 6 (ref 5–15)
BUN: 36 mg/dL — ABNORMAL HIGH (ref 6–20)
CO2: 24 mmol/L (ref 22–32)
Calcium: 9.9 mg/dL (ref 8.9–10.3)
Chloride: 107 mmol/L (ref 98–111)
Creatinine, Ser: 1.84 mg/dL — ABNORMAL HIGH (ref 0.44–1.00)
GFR, Estimated: 31 mL/min — ABNORMAL LOW (ref >60)
Glucose, Bld: 119 mg/dL — ABNORMAL HIGH (ref 70–99)
Potassium: 4 mmol/L (ref 3.5–5.1)
Sodium: 137 mmol/L (ref 135–145)
Total Bilirubin: 0.4 mg/dL (ref 0.3–1.2)
Total Protein: 5.7 g/dL — ABNORMAL LOW (ref 6.5–8.1)

## 2021-10-16 LAB — MAGNESIUM: Magnesium: 2.2 mg/dL (ref 1.7–2.4)

## 2021-10-16 LAB — PHOSPHORUS: Phosphorus: 4.1 mg/dL (ref 2.5–4.6)

## 2021-10-16 LAB — GLUCOSE, CAPILLARY
Glucose-Capillary: 94 mg/dL (ref 70–99)
Glucose-Capillary: 90 mg/dL (ref 70–99)

## 2021-10-16 LAB — TRIGLYCERIDES: Triglycerides: 45 mg/dL (ref <150)

## 2021-10-16 MED ORDER — HEPARIN SOD (PORK) LOCK FLUSH 100 UNIT/ML IV SOLN
250.0000 [IU] | INTRAVENOUS | Status: AC | PRN
  Administered 2021-10-16: 250 [IU]
  Filled 2021-10-16: qty 2.5

## 2021-10-16 MED ORDER — CIPROFLOXACIN 0.2 MG/ML + HEPARIN 5000 UNITS/ML ABX LOCK SOLN
2.5000 mL | Freq: Every day | 0 refills | Status: AC
  Filled 2021-10-16: qty 32.5, 13d supply, fill #0

## 2021-10-16 MED ORDER — LIP MEDEX EX OINT
TOPICAL_OINTMENT | CUTANEOUS | Status: AC
  Administered 2021-10-16: 75
  Filled 2021-10-16: qty 7

## 2021-10-16 MED ORDER — HYDROMORPHONE HCL 1 MG/ML IJ SOLN
0.5000 mg | Freq: Once | INTRAMUSCULAR | Status: AC
  Administered 2021-10-16: 0.5 mg via SUBCUTANEOUS
  Filled 2021-10-16: qty 0.5

## 2021-10-16 MED ORDER — LEVOFLOXACIN IN D5W 500 MG/100ML IV SOLN: 500.0000 mg | INTRAVENOUS | 0 refills | Status: AC

## 2021-10-16 NOTE — TOC Transition Note (Signed)
Transition of Care [facility]) - CM/SW Discharge Note  Patient Details  Name: Caitlin Peters MRN: [id number] Date of Birth: 11-11-1961  Transition of [facility] CM/SW Contact:  Sherie Don, LCSW Phone Number: 10/16/2021, 10:41 AM  Clinical Narrative: Patient is already active with [facility] for IV antibiotics and TPN and [facility] for [facility]. HH and OPAT orders placed and signed. CSW notified Pam with [facility] and Hoyle Sauer with [facility] of discharge. TOC signing off.  Final next level of care: [place] Barriers to Discharge: Barriers Resolved  Patient Goals and CMS Choice Patient states their goals for this hospitalization and ongoing recovery are:: Discharge home CMS Medicare.gov Compare Post Acute Care list provided to:: Patient Choice offered to / list presented to : Patient  Discharge Plan and Services         DME Arranged: N/A DME Agency: NA HH Arranged: RN, TPN HH Agency: Tour manager, Other - See comment ([facility]) Date HH Agency Contacted: 10/16/21 Representative spoke with at [place]: Patient already active with [facility] for [place] for RN  Readmission Risk Interventions Readmission Risk Prevention Plan 09/26/2021 06/22/2021 02/10/2021  Transportation Screening Complete Complete Complete  PCP or Specialist Appt within 3-5 Days - - -  Not Complete comments - - -  [place]RI or [place] Work Consult for [place] Planning/Counseling - - -  SW consult not completed comments - - -  Palliative Care Screening - - -  Medication Review Press photographer) Complete Complete Complete  PCP or Specialist appointment within 3-5 days of discharge - Complete -  [place] or [place] Complete Complete Complete  SW Recovery Care/Counseling Consult Complete Complete Complete  Palliative Care Screening Not Applicable Not Applicable Not [place] Not Applicable Not Applicable Not Applicable

## 2021-10-16 NOTE — Plan of Care (Signed)
  Problem: Health Behavior/Discharge Planning: Goal: Ability to manage health-related needs will improve Outcome: Progressing   Problem: Clinical Measurements: Goal: Ability to maintain clinical measurements within normal limits will improve Outcome: Progressing   Problem: Activity: Goal: Risk for activity intolerance will decrease Outcome: Progressing   Problem: Nutrition: Goal: Adequate nutrition will be maintained Outcome: Progressing   Problem: Coping: Goal: Level of anxiety will decrease Outcome: Progressing   Problem: Pain Managment: Goal: General experience of comfort will improve Outcome: Progressing   Problem: Safety: Goal: Ability to remain free from injury will improve Outcome: Progressing   Problem: Skin Integrity: Goal: Risk for impaired skin integrity will decrease Outcome: Progressing

## 2021-10-16 NOTE — Progress Notes (Signed)
PHARMACY - TOTAL PARENTERAL NUTRITION CONSULT NOTE   Indication:  short bowel syndrome, chronic TPN  Patient Measurements: Height: 5' 8" (172.7 cm) Weight: 57.4 kg (126 lb 8.7 oz) IBW/kg (Calculated) : 63.9 TPN AdjBW (KG): 67.8 Body mass index is 19.24 kg/m.  Assessment: 60 year old female on chronic TPN followed by [facility] for short gut syndrome.  Currently being treated for PICC line infection.  History of recurrent line infections and difficulty with replacing central line. Per ID, ok to resume TPN.   Glucose / Insulin: CBGs < 150, no SSI given/24 hours Electrolytes: elytes wnl Renal: Scr elevated but stable at 1.84 (BL ~1.4), BUN inc Hepatic: LFTs and alk phos elevated on admit > alk phos sl elevated, AST now wnl, ALT still elevated but cont downtrending Intake / Output; MIVF: no mIVF GI Imaging: - 11/13: few distended loops of small bowel in abdomen measuring up to 3.3 cm, similar appearance to the prior exam > patulous bowel vs SBO GI Surgeries / Procedures:  - none this admit  Central access: Prior double-lumen PICC line in place TPN start date: begin inpatient 11/19, continued from PTA  Home TPN regimen from [facility]:  - 1800ml over 12hrs daily  - Provides 85 grams per day, 1088 kcal per day 4x/week - 20% SMOF lipids added 3x/week provides 85 grams protein per day, 1688.    Nutritional Goals: Goal TPN rate provides 90 g of protein and 1818 kcals per day  RD Assessment: Estimated Needs Total Energy Estimated Needs: 1900-2100 kcal Total Protein Estimated Needs: 90-105 grams Total Fluid Estimated Needs: >/= 2 L/day  Current Nutrition:  Regular diet and TPN  Plan:  Discharge home today 11/21, Home Health aware to prepare TPN. Antibiotic dwell will be administered prior to discharge.  No TPN 11/21  Will cycle TPN at home rate over 12 hours using current RD recommendations for inpatient Increase TPN to full rate (1800 mL/day) over 12 hours at 1800 Start rate at 82  mL/hr for 1 hours Increase rate to 164 mL/hr for 10 hours. Decrease rate to 82 mL/hr for 1 hours, then stop. Electrolytes in TPN:  Na 50mEq/L K 30mEq/L Ca 4mEq/L Mg 5mEq/L Phos 7mmol/L Cl:Ac 1:1 Add trace elements to TPN Remove MVI from TPN as patient is taking PO Initiate Sensitive SSI TID AC and QHS - adjust as needed  MIVF per MD Monitor TPN labs on Mon/Thurs Discussed w/ IV team RN  - will receive cycle TPN over 12 hours, and antibiotic lock when TPN is not running.    ,  L PharmD [facility] [phone] 10/16/2021, 11:13 AM  

## 2021-10-16 NOTE — Discharge Summary (Signed)
Physician Discharge Summary  Caitlin Peters:[id number] DOB: 02/27/1961 DOA: 10/07/2021  PCP: Erline Hau, MD  Admit date: 10/07/2021 Discharge date: 10/16/2021  Admitted From: Home Disposition: Home  Recommendations for Outpatient Follow-up:  Follow up with PCP in 1-2 weeks Please obtain BMP/CBC in one week Please follow up with infectious disease as scheduled  Home Health: Nursing Equipment/Devices: No new equipment  Discharge Condition: Stable CODE STATUS: Full Diet recommendation: As tolerated, continue TPN as well for nutritional support  Brief/Interim Summary: Caitlin Peters is a 60 year old female with past medical history significant for Crohn's disease, short bowel syndrome, severe protein calorie malnutrition on TPN since 2003 via right thigh PICC line, essential hypertension, GERD, anxiety/depression, seizure disorder who presents to [facility] ED on 11/12 with fever.  Patient reports day prior, returned from a walk and started having crampy abdominal pain and throbbing in her low back.  She noted she had a fever of 102.0 F at home and took Tylenol.  Due to concern for recurrent history of infections, she called EMS and was brought to the ED for further evaluation.   Assessment & Plan:   Klebsiella pneumonia septicemia, POA Concurrent Acinetobacter infection, POA - Recurrent history of CLABSI 2/2 chronic PICC line use for TPN.   - Repeat cultures here initially remarkable for Kelbsiella, now showing acinetobacter as well - ID following - continue PICC lock w/ abx - current regimen levaquin/cipro through 10/26/2021 - Repeat cultures preliminary negative - reassuring for infection control - Continue to hold all superfluous IV meds/fluids to properly treat line infection per ID discussion   Elevated LFTs, resolving - Possibly secondary to disseminated infection - PRN tylenol use at home but seems appropriate dose/schedule -AST/ALT/Bili downtrending  appropriately -no longer following   Abdominal pain, fluctuating, chronic Patulous bowel vs early/partial small bowel obstruction - CT abdomen/pelvis: few distended small bowel loops similar in appearance to partial/early SBO -continues to have regular bowel movements.  Unfortunately increased abdominal pain today in the setting of increased p.o. intake while patient's TPN was being held transiently   Hypokalemia Continue to replete in TPN as necessary given short gut syndrome - somewhat difficult given ongoing need for abx/lock via PICC line   Essential hypertension - Continue home carvedilol/amlodipine/hydralazine as scheduled at home   Seizure Disorder: Keppra 500 mg p.o. twice daily   Short-bowel syndrome - Teduglutide 1.52m Latty daily - Strict I's and O's -TPN resumed, continue to advance p.o. diet as tolerated   Pancreatic insufficiency: - Creon 36,000 units 3 times daily   Chronic pain syndrome:  --Methadone 5 mg 5x daily --Robaxin 5 1 mg p.o. twice daily --Dilaudid transitioned back to PO route given need to limit PICC access due to ongoing infection and continued abx lock   GERD:  --Protonix 40 mg p.o. daily --Carafate 1 g p.o. twice daily --Famotidine 20 mg p.o. daily as needed heartburn/indigestion   CKD stage IIIb - Creatinine near baseline and stable - avoid IV fluids unless able to bolus prior to abx lock per discussion with ID/Pharmacy   Chronic normocytic/iron deficiency anemia Baseline hemoglobin 7-9 over the past few months.  Hemoglobin 8.4, stable.   Chronic thrombocytopenia Etiology likely secondary to chronic illness/malnutrition.  Platelet count stable.   Moderate protein calorie malnutrition Etiology likely secondary to underlying Crohn's disease with history of short bowel syndrome. Has been on TPN since 2003; Body mass index is 19.24 kg/m. Signs/Symptoms: mild fat depletion, mild muscle depletion, moderate muscle depletion Interventions: Boost  Caitlin Peters, MVI   Discharge Instructions  Discharge Instructions     [facility] pharmacist to adjust dose for Vancomycin, Aminoglycosides and other anti-infective therapies as requested by physician.   Complete by: As directed    [facility] to provide Cath Flo 2m   Complete by: As directed    Administer for PICC line occlusion and as ordered by physician for other access device issues.   Anaphylaxis Kit: Provided to treat any anaphylactic reaction to the medication being provided to the patient if First Dose or when requested by physician   Complete by: As directed    Epinephrine 136mml vial / amp: Administer 0.45m6m0.45ml64mubcutaneously once for moderate to severe anaphylaxis, nurse to call physician and pharmacy when reaction occurs and call 911 if needed for immediate care   Diphenhydramine 50mg68mIV vial: Administer 25-50mg 745mM PRN for first dose reaction, rash, itching, mild reaction, nurse to call physician and pharmacy when reaction occurs   Sodium Chloride 0.9% NS 500ml I41mdminister if needed for hypovolemic blood pressure drop or as ordered by physician after call to physician with anaphylactic reaction   Change dressing on IV access line weekly and PRN   Complete by: As directed    Flush IV access with Sodium Chloride 0.9% and Heparin 10 units/ml or 100 units/ml   Complete by: As directed    Home infusion instructions - [facility]   Complete by: As directed    Instructions: Flush IV access with Sodium Chloride 0.9% and Heparin 10units/ml or 100units/ml   Change dressing on IV access line: Weekly and PRN   Instructions Cath Flo 2mg: Ad77mister for PICC Line occlusion and as ordered by physician for other access device   [facility] pharmacist to adjust dose for: Vancomycin, Aminoglycosides and other anti-infective therapies as requested by physician   Method of administration may be changed at the discretion of home infusion  pharmacist based upon assessment of the patient and/or caregiver's ability to self-administer the medication ordered   Complete by: As directed    Outpatient Parenteral Antibiotic Therapy Information Antibiotic: Cefazolin (Ancef) IVPB; Indications for use: K PNA bacteremia; End Date: 10/21/2021 Both IV doses and antibiotic lock solution for PICC   Complete by: As directed    Both IV doses and antibiotic lock solution for PICC   Antibiotic: Cefazolin (Ancef) IVPB   Indications for use: K PNA bacteremia   End Date: 10/21/2021      Allergies as of 10/16/2021       Reactions   Meperidine Hives   Other reaction(s): GI Upset Due to Chrones   Hyoscyamine Hives, Swelling   Legs swelling Disorientation   Cefepime Other (See Comments)   Neurotoxicity occurring in setting of AKI. Ceftriaxone tolerated during same admit   Gabapentin Other (See Comments)   unknown   Lyrica [pregabalin] Other (See Comments)   Has chronic dehydration and made it worse   Topamax [topiramate] Other (See Comments)   Has chronic dehydration and made it worse   Zosyn [piperacillin Sod-tazobactam So] Other (See Comments)   Patient reports it makes her vomit, her neck stiff, and her "heart feel funny" Affected kidneys   Fentanyl Rash   Pt is allergic to fentanyl patch related to the glue (gives her a rash) Pt states she is NOT allergic to fentanyl IV medicine   Morphine And Related Rash        Medication List     STOP taking these medications  predniSONE 10 MG tablet Commonly known as: DELTASONE       TAKE these medications    amLODipine 10 MG tablet Commonly known as: NORVASC Take 1 tablet by mouth once daily   BIOFREEZE EX Apply 1 application topically as needed (pain).   buPROPion ER 100 MG 12 hr tablet Commonly known as: WELLBUTRIN SR Take 2 tablets by mouth once daily   Calcium 500 MG tablet Take 500 mg by mouth daily.   carvedilol 12.5 MG tablet Commonly known as: COREG Take 1  tablet (12.5 mg total) by mouth 2 (two) times daily with a meal.   cholecalciferol 25 MCG (1000 UNIT) tablet Commonly known as: VITAMIN D3 Take 1,000 Units by mouth daily.   ciprofloxacin 0.2 mg/ml + heparin 5000 units/ml Soln 2.5 mLs by Intracatheter route daily at 12 noon for 13 days. For lumen #1. Infuse for at least 2 hours a day while not in use. Stop date 12/1   ciprofloxacin 0.2 mg/ml + heparin 5000 units/ml Soln 2.5 mLs by Intracatheter route daily at 12 noon for 13 days. For lumen D#2. Infuse for at least 2 hours a day when not in use. Stop date 12/1   cycloSPORINE 0.05 % ophthalmic emulsion Commonly known as: RESTASIS Place 1 drop into both eyes 2 (two) times daily.   Dexilant 60 MG capsule Generic drug: dexlansoprazole Take 1 capsule by mouth once daily What changed: how much to take   diphenoxylate-atropine 2.5-0.025 MG tablet Commonly known as: LOMOTIL Take 1 tablet by mouth 2 (two) times daily as needed for diarrhea or loose stools.   DULoxetine 60 MG capsule Commonly known as: CYMBALTA Take 2 capsules (120 mg total) by mouth daily.   estradiol 2 MG tablet Commonly known as: ESTRACE Take 1 tablet by mouth once daily   famotidine 20 MG tablet Commonly known as: PEPCID Take 20 mg by mouth daily as needed for heartburn or indigestion.   Gattex 5 MG Kit Generic drug: Teduglutide (rDNA) Inject 1.5 mg into the skin daily.   hydrALAZINE 100 MG tablet Commonly known as: APRESOLINE Take 1 tablet (100 mg total) by mouth 3 (three) times daily.   HYDROmorphone 4 MG tablet Commonly known as: DILAUDID Take 4 mg by mouth 4 (four) times daily as needed for moderate pain or severe pain.   levETIRAcetam 500 MG tablet Commonly known as: KEPPRA Take 1 tablet (500 mg total) by mouth 2 (two) times daily.   levofloxacin 500 MG/100ML Soln Commonly known as: LEVAQUIN Inject 100 mLs (500 mg total) into the vein daily for 12 days. Indication:  Klebsiella/Acinetobacter  bacteremia  First Dose: Yes Last Day of Therapy:  10/26/21 Labs - Once weekly:  CBC/D and BMP, Labs - Every other week:  ESR and CRP Method of administration: IV Push Method of administration may be changed at the discretion of home infusion pharmacist based upon assessment of the patient and/or caregiver's ability to self-administer the medication ordered. What changed:  how much to take additional instructions   lidocaine 5 % Commonly known as: LIDODERM Place 1 patch onto the skin daily. Remove & Discard patch within 12 hours or as directed by MD   lipase/protease/amylase 36000 UNITS Cpep capsule Commonly known as: Creon Take 1 capsule (36,000 Units total) by mouth See admin instructions. 36000 units with meals What changed:  when to take this additional instructions   methadone 5 MG tablet Commonly known as: DOLOPHINE Take 1 tablet (5 mg total) by mouth 5 (five) times daily.  methocarbamol 500 MG tablet Commonly known as: Robaxin Take 1 tablet (500 mg total) by mouth in the morning and at bedtime.   MULTIVITAMIN ADULT PO Take 1 tablet by mouth daily.   Narcan 4 MG/0.1ML Liqd nasal spray kit Generic drug: naloxone Place 1 spray into the nose once as needed (overdose).   polyvinyl alcohol 1.4 % ophthalmic solution Commonly known as: LIQUIFILM TEARS Place 1 drop into both eyes as needed for dry eyes.   PRESCRIPTION MEDICATION Inject 1 each into the vein daily. Home TPN . [facility] in [facility] [place] . 1 bag for 12 hours. [phone]   promethazine 25 MG tablet Commonly known as: PHENERGAN TAKE 1 TABLET BY MOUTH EVERY 6 HOURS AS NEEDED FOR NAUSEA FOR VOMITING What changed: See the new instructions.   sodium chloride 0.9 % infusion Inject 1,000 mLs into the vein daily. For chronic dehydration   sucralfate 1 GM/10ML suspension Commonly known as: Carafate Take 10 mLs (1 g total) by mouth 2 (two) times daily.   TRALEMENT IV Inject 1 mL into the vein  See admin instructions. Used in TPN bag 4 times weekly   vitamin B-12 1000 MCG tablet Commonly known as: CYANOCOBALAMIN Take 1,000 mcg by mouth daily.               Discharge Care Instructions  (From admission, onward)           Start     Ordered   10/16/21 0000  Change dressing on IV access line weekly and PRN  (Home infusion instructions - [facility] )        10/16/21 0741            Follow-up Information     [facility] Follow up.   Why: TPN and IV antibiotics        [facility] Follow up.   Why: RN for IV antibiotics and TPN Contact information: [place] [address] [phone]                Allergies  Allergen Reactions   Meperidine Hives    Other reaction(s): GI Upset Due to Chrones    Hyoscyamine Hives and Swelling    Legs swelling Disorientation   Cefepime Other (See Comments)    Neurotoxicity occurring in setting of AKI. Ceftriaxone tolerated during same admit   Gabapentin Other (See Comments)    unknown   Lyrica [Pregabalin] Other (See Comments)    Has chronic dehydration and made it worse   Topamax [Topiramate] Other (See Comments)    Has chronic dehydration and made it worse   Zosyn [Piperacillin Sod-Tazobactam So] Other (See Comments)    Patient reports it makes her vomit, her neck stiff, and her "heart feel funny" Affected kidneys   Fentanyl Rash    Pt is allergic to fentanyl patch related to the glue (gives her a rash) Pt states she is NOT allergic to fentanyl IV medicine   Morphine And Related Rash    Consultations: Infectious disease, GI, palliative  Procedures/Studies: CT ABDOMEN PELVIS W CONTRAST  Result Date: 10/08/2021 CLINICAL DATA:  Inflammatory bowel disease, Crohn's, recent colitis. Abdominal pain. EXAM: CT ABDOMEN AND PELVIS WITH CONTRAST TECHNIQUE: Multidetector CT imaging of the abdomen and pelvis was performed using the standard protocol following bolus administration of  intravenous contrast. CONTRAST:  80m OMNIPAQUE IOHEXOL 350 MG/ML SOLN COMPARISON:  09/25/2021. FINDINGS: Lower chest: Mild dependent atelectasis is noted at the lung bases. The heart is  borderline enlarged. Hepatobiliary: No focal liver abnormality is seen. Mild intrahepatic and extrahepatic biliary ductal dilatation with the gallbladder measuring 1 cm, which may be within normal limits for patient's post cholecystectomy status. Pancreas: There is diffuse dilatation the pancreatic duct measuring up to 4 mm, unchanged from the prior exam. No surrounding fat stranding is seen. Spleen: Normal in size without focal abnormality. Adrenals/Urinary Tract: Adrenal glands are unremarkable. Kidneys are normal, without renal calculi, focal lesion, or hydronephrosis. Bladder is unremarkable. Stomach/Bowel: The stomach is nondistended and otherwise within normal limits. Numerous postsurgical changes are present within the bowel. There is a mildly distended loop of small bowel in the left lower quadrant measuring up to 3.3 cm in diameter and there is mild small bowel wall thickening in the mid abdomen and pelvis. Evaluation of the bowel is somewhat limited due to clumping of the small bowel and lack of oral contrast. Right hemicolectomy changes are noted with a large amount of retained stool in the colon. No free air or pneumatosis. Vascular/Lymphatic: A catheter terminates in the inferior vena cava from a right inguinal approach. There is atherosclerotic calcification of the aorta without evidence of aneurysm. There is a prominent lymph node in the external iliac region on the right measuring 1.2 cm. Nonspecific prominent lymph nodes are also seen in the retroperitoneum in the periaortic space on the left. Reproductive: The uterus is surgically absent. Other: No free fluid in the pelvis. Musculoskeletal: There is diffuse anasarca. No acute osseous abnormality. IMPRESSION: 1. Multifocal surgical changes in the abdomen and pelvis.  There are a few distended loops of small bowel in the abdomen measuring up to 3.3 cm in the left lower quadrant, similar in appearance to the prior exam and may represent patulous bowel versus partial or early small bowel obstruction. 2. Status post right hemicolectomy with a large amount of stool in the residual colon and rectum. 3. Anasarca. Electronically Signed   By: Brett Fairy M.D.   On: 10/08/2021 02:16   CT Abdomen Pelvis W Contrast  Result Date: 09/25/2021 CLINICAL DATA:  Status post cholecystectomy. History of Crohn's disease. EXAM: CT ABDOMEN AND PELVIS WITH CONTRAST TECHNIQUE: Multidetector CT imaging of the abdomen and pelvis was performed using the standard protocol following bolus administration of intravenous contrast. CONTRAST:  40m OMNIPAQUE IOHEXOL 350 MG/ML SOLN COMPARISON:  CT abdomen and pelvis 07/26/2021. FINDINGS: Lower chest: No acute abnormality. Hepatobiliary: Gallbladder surgically absent. There is mild intra and extrahepatic biliary ductal prominence which is stable. There is new pneumobilia in the common bile duct and left lobe of the liver. No focal liver lesion identified. Pancreas: There is mild prominence of the pancreatic duct which is unchanged. No focal pancreatic lesions are seen. Spleen: Normal in size without focal abnormality. Adrenals/Urinary Tract: Adrenal glands are unremarkable. Kidneys are normal, without renal calculi, focal lesion, or hydronephrosis. Bladder is unremarkable. Stomach/Bowel: Again seen are numerous postsurgical changes in the bowel. Colon demonstrates diffuse wall thickening with mild surrounding inflammation diffusely to the level rectum. Stomach is decompressed. There are prominent small bowel loops with air-fluid levels measuring up to 3.5 cm. No definite small bowel wall thickening identified. No pneumatosis or free air. Vascular/Lymphatic: No significant vascular findings are present. No enlarged abdominal or pelvic lymph nodes. Right  femoral approach central venous catheter is again noted distal tip ending at the IVC/right atrial junction, unchanged. Reproductive: Status post hysterectomy. No adnexal masses. Other: No abdominal wall hernia or abnormality. No abdominopelvic ascites. Musculoskeletal: No acute or significant osseous  findings. IMPRESSION: 1. Diffuse colitis. 2. Mildly dilated small bowel loops may represent ileus or enteritis. Developing partial small bowel obstruction not excluded. 3. There is new pneumobilia. Please correlate with surgical history of sphincterotomy. Electronically Signed   By: Ronney Asters M.D.   On: 09/25/2021 19:36   DG Chest Port 1 View  Result Date: 10/07/2021 CLINICAL DATA:  Sepsis. EXAM: PORTABLE CHEST 1 VIEW COMPARISON:  06/19/2021. FINDINGS: The heart size and mediastinal contours are within normal limits. Both lungs are clear. No acute osseous abnormality. Surgical clips are present over the right chest and abdomen. IMPRESSION: No acute cardiopulmonary process. Electronically Signed   By: Brett Fairy M.D.   On: 10/07/2021 23:38     Subjective: No acute issues or events overnight, abdominal pain ongoing this morning, requesting IV Dilaudid which we discussed was not appropriate given her pending discharge and oral Dilaudid prescription at home without any difficulty taking p.o. medications or food at this time.   Discharge Exam: Vitals:   10/16/21 0550 10/16/21 1038  BP: (!) 173/81 (!) 164/73  Pulse: 88 79  Resp: 14 16  Temp: 98.7 F (37.1 C) 99.5 F (37.5 C)  SpO2: 100% 99%   Vitals:   10/15/21 1449 10/15/21 2110 10/16/21 0550 10/16/21 1038  BP: 139/64 135/65 (!) 173/81 (!) 164/73  Pulse: 69 68 88 79  Resp: 18 15 14 16   Temp: 98.6 F (37 C) 98.9 F (37.2 C) 98.7 F (37.1 C) 99.5 F (37.5 C)  TempSrc: Oral Oral Oral Oral  SpO2: 100% 98% 100% 99%  Weight:      Height:        General:  Pleasantly resting in bed, No acute distress. HEENT:  Normocephalic atraumatic.   Sclerae nonicteric, noninjected.  Extraocular movements intact bilaterally. Neck:  Without mass or deformity.  Trachea is midline. Lungs:  Clear to auscultate bilaterally without rhonchi, wheeze, or rales. Heart:  Regular rate and rhythm.  Without murmurs, rubs, or gallops. Abdomen:  Soft, nontender, nondistended.  Without guarding or rebound. Extremities: Without cyanosis, clubbing, edema, or obvious deformity.  Right groin PICC line noted without fluctuance or erythema Vascular:  Dorsalis pedis and posterior tibial pulses palpable bilaterally. Skin:  Warm and dry, no erythema, no ulcerations.    The results of significant diagnostics from this hospitalization (including imaging, microbiology, ancillary and laboratory) are listed below for reference.     Microbiology: Recent Results (from the past 240 hour(s))  Blood culture (routine x 2)     Status: Abnormal (Preliminary result)   Collection Time: 10/08/21 12:02 AM   Specimen: BLOOD  Result Value Ref Range Status   Specimen Description   Final    BLOOD RIGHT PICC LINE Performed at [facility] [address] [place], [place] [address]    Special Requests   Final    BOTTLES DRAWN AEROBIC AND ANAEROBIC Blood Culture adequate volume Performed at [facility] [address], [place], [place] [address]    Culture  Setup Time   Final    GRAM NEGATIVE RODS AEROBIC BOTTLE ONLY CRITICAL VALUE NOTED.  VALUE IS CONSISTENT WITH PREVIOUSLY REPORTED AND CALLED VALUE.    Culture (A)  Final    ACINETOBACTER URSINGII Sent to [place] for further susceptibility testing. Performed at [facility], [place] [address] [place],  [address]    Report Status PENDING  Incomplete  Blood culture (routine x 2)     Status: Abnormal   Collection Time: 10/08/21  12:02 AM   Specimen: BLOOD  Result Value Ref Range Status   Specimen Description   Final    BLOOD RIGHT PORTA CATH Performed at [facility] [address] [place], [place] [address]    Special Requests   Final    BOTTLES DRAWN AEROBIC AND ANAEROBIC Blood Culture adequate volume Performed at [facility] [address], [place],  [address]    Culture  Setup Time   Final    GRAM NEGATIVE RODS IN BOTH AEROBIC AND ANAEROBIC BOTTLES CRITICAL RESULT CALLED TO, READ BACK BY AND VERIFIED WITH: DREW [facility] PHARMD @1808  10/08/21 EB Performed at [facility], [place] [address], [place], [place] [address]    Culture KLEBSIELLA PNEUMONIAE (A)  Final   Report Status 10/11/2021 FINAL  Final   Organism ID, Bacteria KLEBSIELLA PNEUMONIAE  Final      Susceptibility   Klebsiella pneumoniae - MIC*    AMPICILLIN >=32 RESISTANT Resistant     CEFAZOLIN <=4 SENSITIVE Sensitive     CEFEPIME <=0.12 SENSITIVE Sensitive     CEFTAZIDIME <=1 SENSITIVE Sensitive     CEFTRIAXONE <=0.25 SENSITIVE Sensitive     CIPROFLOXACIN <=0.25 SENSITIVE Sensitive     GENTAMICIN <=1 SENSITIVE Sensitive     IMIPENEM <=0.25 SENSITIVE Sensitive     TRIMETH/SULFA <=20 SENSITIVE Sensitive     AMPICILLIN/SULBACTAM 4 SENSITIVE Sensitive     PIP/TAZO <=4 SENSITIVE Sensitive     * KLEBSIELLA PNEUMONIAE  Resp Panel by RT-PCR (Flu A&B, Covid) Nasopharyngeal Swab     Status: None   Collection Time: 10/08/21 12:02 AM   Specimen: Nasopharyngeal Swab; Nasopharyngeal(NP) swabs in vial transport medium  Result Value Ref Range Status   SARS Coronavirus 2 by RT PCR NEGATIVE NEGATIVE Final    Comment: (NOTE) SARS-CoV-2 target nucleic acids are NOT DETECTED.  The SARS-CoV-2 RNA is generally detectable in upper respiratory specimens during the acute phase of infection. The lowest concentration of SARS-CoV-2 viral copies this assay can detect is 138 copies/mL. A negative result does not preclude SARS-Cov-2 infection and should not be used as the sole basis for treatment or other patient management decisions. A negative result  may occur with  improper specimen collection/handling, submission of specimen other than nasopharyngeal swab, presence of viral mutation(s) within the areas targeted by this assay, and inadequate number of viral copies(<138 copies/mL). A negative result must be combined with clinical observations, patient history, and epidemiological information. The expected result is Negative.  Fact Sheet for Patients:  EntrepreneurPulse.com.au  Fact Sheet for Healthcare Providers:  IncredibleEmployment.be  This test is no t yet approved or cleared by the [place] FDA and  has been authorized for detection and/or diagnosis of SARS-CoV-2 by FDA under an Emergency Use Authorization (EUA). This EUA will remain  in effect (meaning this test can be used) for the duration of the COVID-19 declaration under Section 564(b)(1) of the Act, 21 U.S.C.section 360bbb-3(b)(1), unless the authorization is terminated  or revoked sooner.       Influenza A by PCR NEGATIVE NEGATIVE Final   Influenza B by PCR NEGATIVE NEGATIVE Final    Comment: (NOTE) The Xpert Xpress SARS-CoV-2/FLU/RSV plus assay is intended as an aid in the diagnosis of influenza from Nasopharyngeal swab specimens and should not be used as a sole basis for treatment. Nasal washings and aspirates are unacceptable for Xpert Xpress SARS-CoV-2/FLU/RSV testing.  Fact Sheet for Patients: EntrepreneurPulse.com.au  Fact Sheet for Healthcare Providers: IncredibleEmployment.be  This  test is not yet approved or cleared by the [place] and has been authorized for detection and/or diagnosis of SARS-CoV-2 by FDA under an Emergency Use Authorization (EUA). This EUA will remain in effect (meaning this test can be used) for the duration of the COVID-19 declaration under Section 564(b)(1) of the Act, 21 U.S.C. section 360bbb-3(b)(1), unless the authorization is terminated  or revoked.  Performed at [facility], [place] [address] [place], [place] [address]   Blood Culture ID Panel (Reflexed)     Status: Abnormal   Collection Time: 10/08/21 12:02 AM  Result Value Ref Range Status   Enterococcus faecalis NOT DETECTED NOT DETECTED Final   Enterococcus Faecium NOT DETECTED NOT DETECTED Final   Listeria monocytogenes NOT DETECTED NOT DETECTED Final   Staphylococcus species NOT DETECTED NOT DETECTED Final   Staphylococcus aureus (BCID) NOT DETECTED NOT DETECTED Final   Staphylococcus epidermidis NOT DETECTED NOT DETECTED Final   Staphylococcus lugdunensis NOT DETECTED NOT DETECTED Final   Streptococcus species NOT DETECTED NOT DETECTED Final   Streptococcus agalactiae NOT DETECTED NOT DETECTED Final   Streptococcus pneumoniae NOT DETECTED NOT DETECTED Final   Streptococcus pyogenes NOT DETECTED NOT DETECTED Final   A.calcoaceticus-baumannii NOT DETECTED NOT DETECTED Final   Bacteroides fragilis NOT DETECTED NOT DETECTED Final   Enterobacterales DETECTED (A) NOT DETECTED Final    Comment: Enterobacterales represent a large order of gram negative bacteria, not a single organism. CRITICAL RESULT CALLED TO, READ BACK BY AND VERIFIED WITH: DREW WOFFORD PHARMD @1808  10/08/21 EB    Enterobacter cloacae complex NOT DETECTED NOT DETECTED Final   Escherichia coli NOT DETECTED NOT DETECTED Final   Klebsiella aerogenes NOT DETECTED NOT DETECTED Final   Klebsiella oxytoca NOT DETECTED NOT DETECTED Final   Klebsiella pneumoniae DETECTED (A) NOT DETECTED Final    Comment: CRITICAL RESULT CALLED TO, READ BACK BY AND VERIFIED WITH: DREW WOFFORD PHARMD @1808  10/08/21 EB    Proteus species NOT DETECTED NOT DETECTED Final   Salmonella species NOT DETECTED NOT DETECTED Final   Serratia marcescens NOT DETECTED NOT DETECTED Final   Haemophilus influenzae NOT DETECTED NOT DETECTED Final   Neisseria meningitidis NOT DETECTED NOT DETECTED Final   Pseudomonas  aeruginosa NOT DETECTED NOT DETECTED Final   Stenotrophomonas maltophilia NOT DETECTED NOT DETECTED Final   Candida albicans NOT DETECTED NOT DETECTED Final   Candida auris NOT DETECTED NOT DETECTED Final   Candida glabrata NOT DETECTED NOT DETECTED Final   Candida krusei NOT DETECTED NOT DETECTED Final   Candida parapsilosis NOT DETECTED NOT DETECTED Final   Candida tropicalis NOT DETECTED NOT DETECTED Final   Cryptococcus neoformans/gattii NOT DETECTED NOT DETECTED Final   CTX-M ESBL NOT DETECTED NOT DETECTED Final   Carbapenem resistance IMP NOT DETECTED NOT DETECTED Final   Carbapenem resistance KPC NOT DETECTED NOT DETECTED Final   Carbapenem resistance NDM NOT DETECTED NOT DETECTED Final   Carbapenem resist OXA 48 LIKE NOT DETECTED NOT DETECTED Final   Carbapenem resistance VIM NOT DETECTED NOT DETECTED Final    Comment: Performed at [facility], [address] [place], [place] [address]  Susceptibility, Aer + Anaerob     Status: Abnormal   Collection Time: 10/08/21 12:02 AM  Result Value Ref Range Status   Suscept, Aer + Anaerob Final report (A)  Corrected    Comment: (NOTE) Performed At: [facility] [address] [place], [place] [id number] Rush Farmer MD QV:[id number] CORRECTED ON 11/20 AT 2035: PREVIOUSLY REPORTED  AS Preliminary report    Source of Sample [phone] BLD SENSI GNT Aci.ursingii  Final    Comment: Performed at [facility], [place] [address] [place], [place] [address]  Susceptibility Result     Status: Abnormal   Collection Time: 10/08/21 12:02 AM  Result Value Ref Range Status   Suscept Result 1 Comment (A)  Final    Comment: (NOTE) Acinetobacter ursingii Identification performed by account, not confirmed by this laboratory. Testing performed by broth microdilution.    Antimicrobial Suscept Comment  Corrected    Comment: (NOTE)      ** S = Susceptible; I = Intermediate; R = Resistant **                   P = Positive; N =  Negative            MICS are expressed in micrograms per mL   Antibiotic                 RSLT#1    RSLT#2    RSLT#3    RSLT#4 Amikacin                       S Ampicillin/Sulbactam           S Cefepime                       S Cefotaxime                     I Ceftazidime                    R Ceftriaxone                    S Ciprofloxacin                  S Gentamicin                     S Levofloxacin                   S Meropenem                      S Piperacillin/Tazobactam        S Ticarcillin/Clavulanate        S Tobramycin                     S Trimethoprim/Sulfa             R Performed At: [facility] [employer] [place], [place] [id number] Rush Farmer MD IT:[id number]   Culture, blood (routine x 2)     Status: None   Collection Time: 10/09/21 11:33 AM   Specimen: BLOOD RIGHT HAND  Result Value Ref Range Status   Specimen Description   Final    BLOOD RIGHT HAND Performed at [facility], [place] [address], [place], [place] [address]    Special Requests   Final    BOTTLES DRAWN AEROBIC ONLY Blood Culture results may not be optimal due to an inadequate volume of blood received in culture bottles Performed at [place] [address] [place], [place] [address]    Culture   Final    NO GROWTH 5 DAYS Performed at [facility], [place] [address] [place], [place] [address]  Report Status 10/14/2021 FINAL  Final  Culture, blood (routine x 2)     Status: None   Collection Time: 10/09/21 12:24 PM   Specimen: BLOOD  Result Value Ref Range Status   Specimen Description   Final    BLOOD PICC LINE Performed at [facility], [place] [address], [place], [place] [address]    Special Requests   Final    BOTTLES DRAWN AEROBIC AND ANAEROBIC Blood Culture results may not be optimal due to an inadequate volume of blood received in culture bottles Performed at [place] [address] [place], [place] [address]    Culture   Final    NO GROWTH 5 DAYS Performed at [facility], [place] [address], [place], [place] [address]    Report Status 10/14/2021 FINAL  Final  Culture, blood (routine x 2)     Status: None (Preliminary result)   Collection Time: 10/12/21  9:33 AM   Specimen: BLOOD RIGHT HAND  Result Value Ref Range Status   Specimen Description   Final    BLOOD RIGHT HAND Performed at [place] [address], [place], [place] [address]    Special Requests   Final    BOTTLES DRAWN AEROBIC ONLY Blood Culture adequate volume Performed at [place] [address] [place], [place] [address]    Culture   Final    NO GROWTH 3 DAYS Performed at [facility], [place] [address] [place], [place] [address]    Report Status PENDING  Incomplete  Culture, blood (routine x 2)     Status: None (Preliminary result)   Collection Time: 10/12/21 11:13 AM   Specimen: BLOOD  Result Value Ref Range Status   Specimen Description   Final    BLOOD BLOOD RIGHT FOREARM Performed at [place] [address] [place], [place] [address]    Special Requests   Final    BOTTLES DRAWN AEROBIC AND ANAEROBIC Blood Culture adequate volume Performed at [place] [address] [place], [place] [address]    Culture   Final    NO GROWTH 3 DAYS Performed at [facility], [place] [address] [place],  [address]    Report Status PENDING  Incomplete     Labs: BNP (last 3 results) Recent Labs    05/06/21 2010  BNP 35.4   Basic Metabolic Panel: Recent Labs  Lab 10/11/21 0428 10/12/21 1113 10/13/21 0930 10/14/21 0418 10/15/21 0419 10/15/21 1050 10/16/21 0336  NA 138   < > 136 137 136 138 137  K 3.8   < > 3.3* 4.2 4.0 4.0 4.0  CL 107   < > 105 106 108 111 107  CO2 24   < > 24 23 23 23 24   GLUCOSE 95   < > 156* 96 113* 100* 119*  BUN 13   < > 14 19 24* 23* 36*  CREATININE  1.29*   < > 1.85* 1.82* 1.84* 1.80* 1.84*  CALCIUM 8.8*   < > 8.9 9.2 9.1 9.1 9.9  MG 1.9  --   --  2.0 2.0  --  2.2  PHOS  --   --   --  3.9 4.4  --  4.1   < > = values in this interval not displayed.   Liver Function Tests: Recent Labs  Lab 10/10/21 0426 10/11/21 0428 10/16/21 0336  AST 37 23 40  ALT 154* 114* 62*  ALKPHOS 128* 124 130*  BILITOT 0.5 0.7 0.4  PROT 5.5* 5.8* 5.7*  ALBUMIN 2.3* 2.5* 2.8*   No results for input(s): LIPASE, AMYLASE in the last 168 hours. No results for input(s): AMMONIA in the last 168 hours. CBC: Recent Labs  Lab 10/12/21 1113 10/13/21 0930 10/14/21 1438 10/15/21 1050  WBC 3.4* 3.3* 3.2* 6.7  HGB 9.0* 8.8* 8.5* 8.5*  HCT 26.2* 26.6* 25.9* 26.0*  MCV 92.9 94.3 95.2 95.9  PLT 164 162 168 172   Cardiac Enzymes: No results for input(s): CKTOTAL, CKMB, CKMBINDEX, TROPONINI in the last 168 hours. BNP: Invalid input(s): POCBNP CBG: Recent Labs  Lab 10/15/21 1214 10/15/21 1720 10/15/21 2113 10/16/21 0713 10/16/21 1155  GLUCAP 107* 105* 100* 94 90   D-Dimer No results for input(s): DDIMER in the last 72 hours. Hgb A1c No results for input(s): HGBA1C in the last 72 hours. Lipid Profile Recent Labs    10/14/21 0418 10/16/21 0336  TRIG 51 45   Thyroid function studies No results for input(s): TSH, T4TOTAL, T3FREE, THYROIDAB in the last 72 hours.  Invalid input(s): FREET3 Anemia work up No results for input(s): VITAMINB12, FOLATE, FERRITIN, TIBC, IRON, RETICCTPCT in the last 72 hours. Urinalysis    Component Value Date/Time   COLORURINE STRAW (A) 09/27/2021 1030   APPEARANCEUR CLEAR 09/27/2021 1030   LABSPEC 1.006 09/27/2021 1030   PHURINE 8.0 09/27/2021 1030   GLUCOSEU NEGATIVE 09/27/2021 1030   HGBUR NEGATIVE 09/27/2021 1030   BILIRUBINUR NEGATIVE 09/27/2021 1030   KETONESUR NEGATIVE 09/27/2021 1030   PROTEINUR 30 (A) 09/27/2021 1030   NITRITE NEGATIVE 09/27/2021 1030   LEUKOCYTESUR NEGATIVE 09/27/2021 1030   Sepsis  Labs Invalid input(s): PROCALCITONIN,  WBC,  LACTICIDVEN Microbiology Recent Results (from the past 240 hour(s))  Blood culture (routine x 2)     Status: Abnormal (Preliminary result)   Collection Time: 10/08/21 12:02 AM   Specimen: BLOOD  Result Value Ref Range Status   Specimen Description   Final    BLOOD RIGHT PICC LINE Performed at [facility], [place] [address] [place], [place] [address]    Special Requests   Final    BOTTLES DRAWN AEROBIC AND ANAEROBIC Blood Culture adequate volume Performed at [place] [address], [place], [place] [address]    Culture  Setup Time   Final    GRAM NEGATIVE RODS AEROBIC BOTTLE ONLY CRITICAL VALUE NOTED.  VALUE IS CONSISTENT WITH PREVIOUSLY REPORTED AND CALLED VALUE.    Culture (A)  Final    ACINETOBACTER URSINGII Sent to [place] for further susceptibility testing. Performed at [facility], [place] [address], [place], [place] [address]    Report Status PENDING  Incomplete  Blood culture (routine x 2)     Status: Abnormal   Collection Time: 10/08/21 12:02 AM   Specimen: BLOOD  Result Value Ref Range Status   Specimen Description   Final    BLOOD RIGHT PORTA CATH Performed at [place] [address] [place], [place] [address]    Special Requests   Final    BOTTLES DRAWN AEROBIC AND ANAEROBIC Blood Culture adequate volume Performed at [place] [address] [place], [place] [address]    Culture  Setup Time   Final    GRAM NEGATIVE RODS IN BOTH AEROBIC AND ANAEROBIC BOTTLES CRITICAL RESULT CALLED TO, READ BACK BY AND VERIFIED WITH: DREW [facility] PHARMD @1808  10/08/21 EB Performed at  [facility], [place] [address], [place], [place] [address]    Culture KLEBSIELLA PNEUMONIAE (A)  Final   Report Status 10/11/2021 FINAL  Final   Organism ID, Bacteria KLEBSIELLA PNEUMONIAE  Final      Susceptibility   Klebsiella pneumoniae - MIC*     AMPICILLIN >=32 RESISTANT Resistant     CEFAZOLIN <=4 SENSITIVE Sensitive     CEFEPIME <=0.12 SENSITIVE Sensitive     CEFTAZIDIME <=1 SENSITIVE Sensitive     CEFTRIAXONE <=0.25 SENSITIVE Sensitive     CIPROFLOXACIN <=0.25 SENSITIVE Sensitive     GENTAMICIN <=1 SENSITIVE Sensitive     IMIPENEM <=0.25 SENSITIVE Sensitive     TRIMETH/SULFA <=20 SENSITIVE Sensitive     AMPICILLIN/SULBACTAM 4 SENSITIVE Sensitive     PIP/TAZO <=4 SENSITIVE Sensitive     * KLEBSIELLA PNEUMONIAE  Resp Panel by RT-PCR (Flu A&B, Covid) Nasopharyngeal Swab     Status: None   Collection Time: 10/08/21 12:02 AM   Specimen: Nasopharyngeal Swab; Nasopharyngeal(NP) swabs in vial transport medium  Result Value Ref Range Status   SARS Coronavirus 2 by RT PCR NEGATIVE NEGATIVE Final    Comment: (NOTE) SARS-CoV-2 target nucleic acids are NOT DETECTED.  The SARS-CoV-2 RNA is generally detectable in upper respiratory specimens during the acute phase of infection. The lowest concentration of SARS-CoV-2 viral copies this assay can detect is 138 copies/mL. A negative result does not preclude SARS-Cov-2 infection and should not be used as the sole basis for treatment or other patient management decisions. A negative result may occur with  improper specimen collection/handling, submission of specimen other than nasopharyngeal swab, presence of viral mutation(s) within the areas targeted by this assay, and inadequate number of viral copies(<138 copies/mL). A negative result must be combined with clinical observations, patient history, and epidemiological information. The expected result is Negative.  Fact Sheet for Patients:  EntrepreneurPulse.com.au  Fact Sheet for Healthcare Providers:  IncredibleEmployment.be  This test is no t yet approved or cleared by the [place] FDA and  has been authorized for detection and/or diagnosis of SARS-CoV-2 by FDA under an Emergency Use  Authorization (EUA). This EUA will remain  in effect (meaning this test can be used) for the duration of the COVID-19 declaration under Section 564(b)(1) of the Act, 21 U.S.C.section 360bbb-3(b)(1), unless the authorization is terminated  or revoked sooner.       Influenza A by PCR NEGATIVE NEGATIVE Final   Influenza B by PCR NEGATIVE NEGATIVE Final    Comment: (NOTE) The Xpert Xpress SARS-CoV-2/FLU/RSV plus assay is intended as an aid in the diagnosis of influenza from Nasopharyngeal swab specimens and should not be used as a sole basis for treatment. Nasal washings and aspirates are unacceptable for Xpert Xpress SARS-CoV-2/FLU/RSV testing.  Fact Sheet for Patients: EntrepreneurPulse.com.au  Fact Sheet for Healthcare Providers: IncredibleEmployment.be  This test is not yet approved or cleared by the [place] FDA and has been authorized for detection and/or diagnosis of SARS-CoV-2 by FDA under an Emergency Use Authorization (EUA). This EUA will remain in effect (meaning this test can be used) for the duration of the COVID-19 declaration under Section 564(b)(1) of the Act, 21 U.S.C. section 360bbb-3(b)(1), unless the authorization is terminated or revoked.  Performed at [facility], [place] [address] [place], [place] [address]   Blood Culture ID Panel (Reflexed)     Status: Abnormal   Collection Time: 10/08/21 12:02 AM  Result Value Ref Range Status   Enterococcus faecalis NOT DETECTED  NOT DETECTED Final   Enterococcus Faecium NOT DETECTED NOT DETECTED Final   Listeria monocytogenes NOT DETECTED NOT DETECTED Final   Staphylococcus species NOT DETECTED NOT DETECTED Final   Staphylococcus aureus (BCID) NOT DETECTED NOT DETECTED Final   Staphylococcus epidermidis NOT DETECTED NOT DETECTED Final   Staphylococcus lugdunensis NOT DETECTED NOT DETECTED Final   Streptococcus species NOT DETECTED NOT DETECTED Final    Streptococcus agalactiae NOT DETECTED NOT DETECTED Final   Streptococcus pneumoniae NOT DETECTED NOT DETECTED Final   Streptococcus pyogenes NOT DETECTED NOT DETECTED Final   A.calcoaceticus-baumannii NOT DETECTED NOT DETECTED Final   Bacteroides fragilis NOT DETECTED NOT DETECTED Final   Enterobacterales DETECTED (A) NOT DETECTED Final    Comment: Enterobacterales represent a large order of gram negative bacteria, not a single organism. CRITICAL RESULT CALLED TO, READ BACK BY AND VERIFIED WITH: DREW WOFFORD PHARMD @1808  10/08/21 EB    Enterobacter cloacae complex NOT DETECTED NOT DETECTED Final   Escherichia coli NOT DETECTED NOT DETECTED Final   Klebsiella aerogenes NOT DETECTED NOT DETECTED Final   Klebsiella oxytoca NOT DETECTED NOT DETECTED Final   Klebsiella pneumoniae DETECTED (A) NOT DETECTED Final    Comment: CRITICAL RESULT CALLED TO, READ BACK BY AND VERIFIED WITH: DREW WOFFORD PHARMD @1808  10/08/21 EB    Proteus species NOT DETECTED NOT DETECTED Final   Salmonella species NOT DETECTED NOT DETECTED Final   Serratia marcescens NOT DETECTED NOT DETECTED Final   Haemophilus influenzae NOT DETECTED NOT DETECTED Final   Neisseria meningitidis NOT DETECTED NOT DETECTED Final   Pseudomonas aeruginosa NOT DETECTED NOT DETECTED Final   Stenotrophomonas maltophilia NOT DETECTED NOT DETECTED Final   Candida albicans NOT DETECTED NOT DETECTED Final   Candida auris NOT DETECTED NOT DETECTED Final   Candida glabrata NOT DETECTED NOT DETECTED Final   Candida krusei NOT DETECTED NOT DETECTED Final   Candida parapsilosis NOT DETECTED NOT DETECTED Final   Candida tropicalis NOT DETECTED NOT DETECTED Final   Cryptococcus neoformans/gattii NOT DETECTED NOT DETECTED Final   CTX-M ESBL NOT DETECTED NOT DETECTED Final   Carbapenem resistance IMP NOT DETECTED NOT DETECTED Final   Carbapenem resistance KPC NOT DETECTED NOT DETECTED Final   Carbapenem resistance NDM NOT DETECTED NOT DETECTED  Final   Carbapenem resist OXA 48 LIKE NOT DETECTED NOT DETECTED Final   Carbapenem resistance VIM NOT DETECTED NOT DETECTED Final    Comment: Performed at [facility], [address], [place], [place] [address]  Susceptibility, Aer + Anaerob     Status: Abnormal   Collection Time: 10/08/21 12:02 AM  Result Value Ref Range Status   Suscept, Aer + Anaerob Final report (A)  Corrected    Comment: (NOTE) Performed At: [facility] [address] [place], [place] [id number] Rush Farmer MD AT:[id number] CORRECTED ON 11/20 AT 2035: PREVIOUSLY REPORTED AS Preliminary report    Source of Sample [id number] BLD SENSI GNT Aci.ursingii  Final    Comment: Performed at [facility], [place] [address], [place], [place] [address]  Susceptibility Result     Status: Abnormal   Collection Time: 10/08/21 12:02 AM  Result Value Ref Range Status   Suscept Result 1 Comment (A)  Final    Comment: (NOTE) Acinetobacter ursingii Identification performed by account, not confirmed by this laboratory. Testing performed by broth microdilution.    Antimicrobial Suscept Comment  Corrected    Comment: (NOTE)      ** S = Susceptible; I = Intermediate; R =  Resistant **                   P = Positive; N = Negative            MICS are expressed in micrograms per mL   Antibiotic                 RSLT#1    RSLT#2    RSLT#3    RSLT#4 Amikacin                       S Ampicillin/Sulbactam           S Cefepime                       S Cefotaxime                     I Ceftazidime                    R Ceftriaxone                    S Ciprofloxacin                  S Gentamicin                     S Levofloxacin                   S Meropenem                      S Piperacillin/Tazobactam        S Ticarcillin/Clavulanate        S Tobramycin                     S Trimethoprim/Sulfa             R Performed At: [facility] [employer] [place], [place] [id number] Rush Farmer MD  FT:[id number]   Culture, blood (routine x 2)     Status: None   Collection Time: 10/09/21 11:33 AM   Specimen: BLOOD RIGHT HAND  Result Value Ref Range Status   Specimen Description   Final    BLOOD RIGHT HAND Performed at [facility], [place] [address] [place], [place] [address]    Special Requests   Final    BOTTLES DRAWN AEROBIC ONLY Blood Culture results may not be optimal due to an inadequate volume of blood received in culture bottles Performed at [place] [address] [place], [place] [address]    Culture   Final    NO GROWTH 5 DAYS Performed at [facility], [place] [address] [place], [place] [address]    Report Status 10/14/2021 FINAL  Final  Culture, blood (routine x 2)     Status: None   Collection Time: 10/09/21 12:24 PM   Specimen: BLOOD  Result Value Ref Range Status   Specimen Description   Final    BLOOD PICC LINE Performed at [facility], [place] [address] [place], [place] [address]    Special Requests   Final    BOTTLES DRAWN AEROBIC AND ANAEROBIC Blood Culture results may not be optimal due to an inadequate volume of blood received in culture bottles Performed at [place] [address], [place], [place] [address]    Culture  Final    NO GROWTH 5 DAYS Performed at [facility], [place] [address] [place], [place] [address]    Report Status 10/14/2021 FINAL  Final  Culture, blood (routine x 2)     Status: None (Preliminary result)   Collection Time: 10/12/21  9:33 AM   Specimen: BLOOD RIGHT HAND  Result Value Ref Range Status   Specimen Description   Final    BLOOD RIGHT HAND Performed at [place] [address], [place], [place] [address]    Special Requests   Final    BOTTLES DRAWN AEROBIC ONLY Blood Culture adequate volume Performed at [place] [address], [place], [place] [address]    Culture   Final     NO GROWTH 3 DAYS Performed at [facility], [place] [address] [place], [place] [address]    Report Status PENDING  Incomplete  Culture, blood (routine x 2)     Status: None (Preliminary result)   Collection Time: 10/12/21 11:13 AM   Specimen: BLOOD  Result Value Ref Range Status   Specimen Description   Final    BLOOD BLOOD RIGHT FOREARM Performed at [place] [address] [place], [place] [address]    Special Requests   Final    BOTTLES DRAWN AEROBIC AND ANAEROBIC Blood Culture adequate volume Performed at [place] [address], [place], [place] [address]    Culture   Final    NO GROWTH 3 DAYS Performed at [facility], [employer] [address], [place],  [address]    Report Status PENDING  Incomplete     Time coordinating discharge: Over 30 minutes  SIGNED:   Little Ishikawa, DO [facility] 10/16/2021, 12:51 PM Pager   If 7PM-7AM, please contact night-coverage [email or web address]

## 2021-10-16 NOTE — Care Management Important Message (Signed)
Important Message  Patient Details IM Letter given to the Patient Name: Caitlin Peters MRN: [id number] Date of Birth: 1961/10/08   Medicare Important Message Given:  Yes     [name] 10/16/2021, 3:39 PM

## 2021-10-16 NOTE — Progress Notes (Signed)
Provided discharge education/instructions, all questions and concerns addressed, Pt not in acute distress. Meds stored in pharmacy returned to Pt, Cipro flush pulled by IV team, PICC capped for home per [facility] RN Carolynn Sayers). Pt to discharge home with belongings accompanied by daughter.

## 2021-10-17 ENCOUNTER — Telehealth: Payer: Self-pay

## 2021-10-17 LAB — CULTURE, BLOOD (ROUTINE X 2)
Culture: NO GROWTH
Culture: NO GROWTH
Special Requests: ADEQUATE
Special Requests: ADEQUATE

## 2021-10-17 NOTE — Telephone Encounter (Signed)
Transition Care Management Unsuccessful Follow-up Telephone Call  Date of discharge and from where:  10/16/21 from [facility]  Attempts:  1st Attempt  Reason for unsuccessful TCM follow-up call:  Left voice message

## 2021-10-17 NOTE — Telephone Encounter (Signed)
Transition Care Management Follow-up Telephone Call Date of discharge and from where: 10/16/21 from [facility] How have you been since you were released from the hospital? Pt states she feeling fine, but sleepy. Any questions or concerns? Yes  Items Reviewed: Did the pt receive and understand the discharge instructions provided? Yes  Medications obtained and verified? Yes  Other?  Pt has IV infusion that a nurse comes out to do; this is a service that she was getting prior to admission to hospital Any new allergies since your discharge? No  Dietary orders reviewed? Yes Do you have support at home? Yes   Home Care and Equipment/Supplies: Were home health services ordered? no Were any new equipment or medical supplies ordered?  No Were you able to get the supplies/equipment? yes Do you have any questions related to the use of the equipment or supplies? No  Functional Questionnaire: (I = Independent and D = Dependent) ADLs: D  Bathing/Dressing- I  Meal Prep- D  Eating- I  Maintaining continence- I  Transferring/Ambulation- I  Managing Meds- I  Follow up appointments reviewed:  PCP Hospital f/u appt confirmed? Yes  Scheduled to see Dr Jerilee Hoh on 10/27/21 @ [facility] f/u appt confirmed? Yes  Scheduled to see Dr Baxter Flattery on 10/31/21 @ 2pm. Are transportation arrangements needed? No  If their condition worsens, is the pt aware to call PCP or go to the Emergency Dept.? Yes Was the patient provided with contact information for the PCP's office or ED? Yes Was to pt encouraged to call back with questions or concerns? Yes

## 2021-10-25 ENCOUNTER — Other Ambulatory Visit: Payer: Self-pay | Admitting: Internal Medicine

## 2021-10-25 DIAGNOSIS — K912 Postsurgical malabsorption, not elsewhere classified: Secondary | ICD-10-CM

## 2021-10-27 ENCOUNTER — Inpatient Hospital Stay: Payer: Medicare Other | Admitting: Internal Medicine

## 2021-10-30 LAB — BASIC METABOLIC PANEL
BUN: 49 — AB (ref 4–21)
CO2: 21 (ref 13–22)
Chloride: 100 (ref 99–108)
Creatinine: 2.3 — AB (ref 0.5–1.1)
Glucose: 82
Potassium: 5.4 — AB (ref 3.4–5.3)
Sodium: 135 — AB (ref 137–147)

## 2021-10-30 LAB — CBC: RBC: 2.55 — AB (ref 3.87–5.11)

## 2021-10-30 LAB — HEPATIC FUNCTION PANEL
ALT: 260 — AB (ref 7–35)
AST: 275 — AB (ref 13–35)
Alkaline Phosphatase: 196 — AB (ref 25–125)
Bilirubin, Total: 0.4

## 2021-10-30 LAB — CBC AND DIFFERENTIAL
HCT: 23 — AB (ref 36–46)
Hemoglobin: 8.1 — AB (ref 12.0–16.0)
Neutrophils Absolute: 591
Platelets: 133 — AB (ref 150–399)
WBC: 1.5

## 2021-10-30 LAB — COMPREHENSIVE METABOLIC PANEL
Albumin: 3.2 — AB (ref 3.5–5.0)
Calcium: 9.1 (ref 8.7–10.7)
Globulin: 2.6

## 2021-10-31 ENCOUNTER — Encounter: Payer: Self-pay | Admitting: Internal Medicine

## 2021-10-31 ENCOUNTER — Ambulatory Visit (INDEPENDENT_AMBULATORY_CARE_PROVIDER_SITE_OTHER): Payer: Medicare Other | Admitting: Internal Medicine

## 2021-10-31 ENCOUNTER — Other Ambulatory Visit: Payer: Self-pay

## 2021-10-31 VITALS — BP 110/71 | HR 69 | Temp 98.0°F | Resp 16 | Wt 133.3 lb

## 2021-10-31 DIAGNOSIS — K912 Postsurgical malabsorption, not elsewhere classified: Secondary | ICD-10-CM | POA: Diagnosis not present

## 2021-10-31 DIAGNOSIS — L309 Dermatitis, unspecified: Secondary | ICD-10-CM

## 2021-10-31 DIAGNOSIS — Z8619 Personal history of other infectious and parasitic diseases: Secondary | ICD-10-CM | POA: Diagnosis present

## 2021-10-31 DIAGNOSIS — N1832 Chronic kidney disease, stage 3b: Secondary | ICD-10-CM | POA: Diagnosis not present

## 2021-10-31 NOTE — Patient Instructions (Signed)
After hooking tpn at night, then apply lotion to hands (either cerave eczema lotion or eucerin plus vaseline in 50/50 to help with dry hands

## 2021-10-31 NOTE — Progress Notes (Signed)
RFV: follow up from hospitalization  Patient ID: Caitlin Peters, female   DOB: 20-Jul-1961, 60 y.o.   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 She was hospitalized recently where she had been treated for line infection with klebsiella and acinetobacter, just finished Levofloxacin 500 mg IV + Ciprofloxacin intracatheter lock solution on 10/26/21. Kept her line in place.  - weight improving slowly. Fatigue mostly. - having some nausea where zofran that has helped her symptoms - trying to save up money to get second opinion for small bowel transplantation at [place] -continues to be adherent to dr Domenica Fail, her GI doctor's, instruction.to continue for hydration and TPN of her femoral line. Vigilant with her line  Haven't had covid or flu recently.  Soc hx: her dog smokey pete keeps her healthy and busy per [facility]  Outpatient Encounter Medications as of 10/31/2021  Medication Sig   amLODipine (NORVASC) 10 MG tablet Take 1 tablet by mouth once daily (Patient taking differently: Take 10 mg by mouth daily.)   buPROPion ER (WELLBUTRIN SR) 100 MG 12 hr tablet Take 2 tablets by mouth once daily (Patient taking differently: Take 200 mg by mouth daily.)   Calcium 500 MG tablet Take 500 mg by mouth daily.   carvedilol (COREG) 12.5 MG tablet Take 1 tablet (12.5 mg total) by mouth 2 (two) times daily with a meal.   cholecalciferol (VITAMIN D3) 25 MCG (1000 UT) tablet Take 1,000 Units by mouth daily.    cycloSPORINE (RESTASIS) 0.05 % ophthalmic emulsion Place 1 drop into both eyes 2 (two) times daily.   DEXILANT 60 MG capsule Take 1 capsule by mouth once daily (Patient taking differently: Take 60 mg by mouth daily.)   diphenoxylate-atropine (LOMOTIL) 2.5-0.025 MG tablet Take 1 tablet by mouth 2 (two) times daily as needed for diarrhea or loose stools.   DULoxetine (CYMBALTA) 60 MG capsule Take 2 capsules (120 mg total) by mouth daily.    estradiol (ESTRACE) 2 MG tablet Take 1 tablet by mouth once daily (Patient taking differently: Take 2 mg by mouth daily.)   famotidine (PEPCID) 20 MG tablet Take 20 mg by mouth daily as needed for heartburn or indigestion.   GATTEX 5 MG KIT Inject 1.5 mg into the skin daily.   hydrALAZINE (APRESOLINE) 100 MG tablet Take 1 tablet (100 mg total) by mouth 3 (three) times daily.   HYDROmorphone (DILAUDID) 4 MG tablet Take 4 mg by mouth 4 (four) times daily as needed for moderate pain or severe pain.   levETIRAcetam (KEPPRA) 500 MG tablet Take 1 tablet (500 mg total) by mouth 2 (two) times daily.   lidocaine (LIDODERM) 5 % Place 1 patch onto the skin daily. Remove & Discard patch within 12 hours or as directed by MD   lipase/protease/amylase (CREON) 36000 UNITS CPEP capsule Take 1 capsule (36,000 Units total) by mouth See admin instructions. 36000 units with meals (Patient taking differently: Take 36,000 Units by mouth 3 (three) times daily with meals.)   Menthol, Topical Analgesic, (BIOFREEZE EX) Apply 1 application topically as needed (pain).   methadone (DOLOPHINE) 5 MG tablet Take 1 tablet (5 mg total) by mouth 5 (five) times daily.   methocarbamol (ROBAXIN) 500 MG tablet Take 1 tablet (500 mg total) by mouth in the morning and at bedtime.   Multiple Vitamins-Minerals (MULTIVITAMIN ADULT PO) Take 1 tablet by mouth daily.   polyvinyl alcohol (LIQUIFILM TEARS) 1.4 % ophthalmic solution Place 1  drop into both eyes as needed for dry eyes.   PRESCRIPTION MEDICATION Inject 1 each into the vein daily. Home TPN . [facility] . 1 bag for 12 hours. [phone]   promethazine (PHENERGAN) 25 MG tablet TAKE 1 TABLET BY MOUTH EVERY 6 HOURS AS NEEDED FOR NAUSEA FOR VOMITING   sodium chloride 0.9 % infusion Inject 1,000 mLs into the vein daily. For chronic dehydration   sucralfate (CARAFATE) 1 GM/10ML suspension Take 10 mLs (1 g total) by mouth 2 (two) times daily.   Trace Minerals  Cu-Mn-Se-Zn (TRALEMENT IV) Inject 1 mL into the vein See admin instructions. Used in TPN bag 4 times weekly   vitamin B-12 (CYANOCOBALAMIN) 1000 MCG tablet Take 1,000 mcg by mouth daily.    NARCAN 4 MG/0.1ML LIQD nasal spray kit Place 1 spray into the nose once as needed (overdose). (Patient not taking: Reported on 10/31/2021)   No facility-administered encounter medications on file as of 10/31/2021.     Patient Active Problem List   Diagnosis Date Noted   SBO (small bowel obstruction) ([facility]) 10/08/2021   Vertebral osteomyelitis ([place])    Bloodstream infection due to central venous catheter    Thrombocytopenia ([name])    Malnutrition of moderate degree 07/06/2021   Transaminitis    Hypophosphatemia    Chronic idiopathic constipation    Left shoulder pain 05/23/2021   Elevated LFTs    Sepsis ([place]) 05/06/2021   Acidosis 04/08/2021   Colitis 04/07/2021   Intractable nausea and vomiting 03/11/2021   Thoracic discitis 02/10/2021   Pressure injury of skin 12/18/2020   Bacteremia 12/17/2020   Seizures ([place]) 12/17/2020   Drug-seeking behavior 12/16/2020   Seizure ([place]) 12/15/2020   CKD (chronic kidney disease) stage 3, GFR 30-59 ml/min ([place]) 12/06/2020   COVID-19 virus infection 12/06/2020   Protein calorie malnutrition ([place]) 12/06/2020   Palpitations 11/22/2020   PAC (premature atrial contraction) 11/22/2020   History of central line-associated bloodstream infection (CLABSI) 10/21/2020   Nausea & vomiting 10/21/2020   Choledocholithiasis (sludge) s/p ERCP 10/2019 10/21/2020   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   Abdominal pain 46/56/8127   Acute metabolic encephalopathy    Hypomagnesemia    Partial small bowel obstruction ([place])    Bacteremia associated with intravascular line ([place]) 08/04/2020   Enterobacter sepsis ([place]) 07/22/2020   Fever of unknown origin 06/27/2020   Anxiety 06/03/2020   Acute pancreatitis 04/13/2020   Infection due to Acinetobacter species 03/10/2020    Pancytopenia ([place]) 03/05/2020   Central line infection    Elevated liver enzymes 01/02/2020   Cholangitis    Anasarca 10/28/2019   Acute kidney injury superimposed on chronic kidney disease ([place]) 10/28/2019   Falls 10/28/2019   Malnutrition ([place])    Vitamin B12 deficiency    GERD (gastroesophageal reflux disease)    Chronic pain syndrome    Hypokalemia due to excessive gastrointestinal loss of potassium 10/13/2019   Fever    Hypokalemia 10/12/2019   Chronic diarrhea 10/12/2019   Dysuria 10/12/2019   Bilateral lower extremity edema 10/12/2019   Hypertension 10/12/2019   AKI (acute kidney injury) ([place]) 10/12/2019   Candidemia ([place]) 08/27/2019   IDA (iron deficiency anemia) 11/03/2018   Dilation of biliary tract 08/28/2018   Severe diarrhea 03/07/2018   LFTs abnormal 01/27/2018   GI tract obstruction ([place]) 01/27/2018   Bacteremia due to Klebsiella pneumoniae 09/13/2017   HTN (hypertension), benign 12/02/2016   Intractable pain 12/02/2016   Anemia 12/02/2016   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  Avascular necrosis ([place]) 06/14/2015   Polyarthralgia 05/03/2015   On total parenteral nutrition (TPN) 12/30/2014   Osteoporosis 12/24/2014   Low back pain 12/16/2014   Vitamin D deficiency 12/16/2014   Short gut syndrome 07/15/2014   History of colonic diverticulitis 2014   Depression 07/24/2012   Gram-positive bacteremia 07/24/2012   Small bowel obstruction due to adhesions ([place]) 07/24/2012   Diarrhea 12/13/2011   Neuralgia and neuritis 06/01/2011   Myalgia and myositis 08/12/2003   Crohn's disease of both small and large intestine with other complication ([place]) 6333     Health Maintenance Due  Topic Date Due   PAP SMEAR-Modifier  Never done   COVID-19 Vaccine (4 - Booster for [place] series) 11/14/2020     Review of Systems 12 point ros is negative except what is mentioned above Physical Exam   BP 110/71   Pulse 69   Temp 98 F (36.7 C) (Temporal)   Resp 16   Wt 133 lb  4.8 oz (60.5 kg)   SpO2 100%   BMI 20.27 kg/m   Physical Exam  Constitutional:  oriented to person, place, and time. appears well-developed and well-nourished. No distress.  HENT: [place]/AT, PERRLA, no scleral icterus Mouth/Throat: Oropharynx is clear and moist. No oropharyngeal exudate.  Cardiovascular: Normal rate, regular rhythm and normal heart sounds. Exam reveals no gallop and no friction rub.  No murmur heard.  Pulmonary/Chest: Effort normal and breath sounds normal. No respiratory distress.  has no wheezes.  Neck = supple, no nuchal rigidity Abdominal: Soft. Bowel sounds are normal.  exhibits no distension. There is no tenderness Ext: femoral line is dressed c/d/i Neurological: alert and oriented to person, place, and time.  Skin: Skin is warm and dry, flaky hands Psychiatric: a normal mood and affect.  behavior is normal.    CBC Lab Results  Component Value Date   WBC 6.7 10/15/2021   RBC 2.71 (L) 10/15/2021   HGB 8.5 (L) 10/15/2021   HCT 26.0 (L) 10/15/2021   PLT 172 10/15/2021   MCV 95.9 10/15/2021   MCH 31.4 10/15/2021   MCHC 32.7 10/15/2021   RDW 14.5 10/15/2021   LYMPHSABS 0.3 (L) 10/08/2021   MONOABS 0.2 10/08/2021   EOSABS 0.0 10/08/2021    BMET Lab Results  Component Value Date   NA 137 10/16/2021   K 4.0 10/16/2021   CL 107 10/16/2021   CO2 24 10/16/2021   GLUCOSE 119 (H) 10/16/2021   BUN 36 (H) 10/16/2021   CREATININE 1.84 (H) 10/16/2021   CALCIUM 9.9 10/16/2021   GFRNONAA 31 (L) 10/16/2021   GFRAA 55 (L) 08/12/2020      Assessment and Plan Hx of central line infection = completed course of treatment. Thus far appears stable, sans infection  Ckd 4 = stable Short gut syndrome = continue her current care per dr Domenica Fail. Will always be at risk for line infection given her dependency and TPN use.  Dermatitis to hands = likely from excessive hand washing. Recommend to try to keep hydrated skin with  do 50/50 eucerin/vaseline to hands

## 2021-11-02 ENCOUNTER — Encounter: Payer: Self-pay | Admitting: Internal Medicine

## 2021-11-06 LAB — CBC AND DIFFERENTIAL
HCT: 24 — AB (ref 36–46)
Hemoglobin: 8.1 — AB (ref 12.0–16.0)
Neutrophils Absolute: 1792
Platelets: 175 (ref 150–399)
WBC: 3.2

## 2021-11-06 LAB — BASIC METABOLIC PANEL
BUN: 53 — AB (ref 4–21)
CO2: 26 — AB (ref 13–22)
Chloride: 105 (ref 99–108)
Creatinine: 2.2 — AB (ref 0.5–1.1)
Glucose: 62
Potassium: 4.9 (ref 3.4–5.3)
Sodium: 140 (ref 137–147)

## 2021-11-06 LAB — CULTURE, BLOOD (ROUTINE X 2): Special Requests: ADEQUATE

## 2021-11-06 LAB — HEPATIC FUNCTION PANEL
ALT: 229 — AB (ref 7–35)
AST: 86 — AB (ref 13–35)
Alkaline Phosphatase: 274 — AB (ref 25–125)
Bilirubin, Total: 0.5

## 2021-11-06 LAB — COMPREHENSIVE METABOLIC PANEL
Albumin: 3.4 — AB (ref 3.5–5.0)
Calcium: 8.9 (ref 8.7–10.7)
Globulin: 2.9

## 2021-11-06 LAB — CBC: RBC: 2.55 — AB (ref 3.87–5.11)

## 2021-11-07 ENCOUNTER — Other Ambulatory Visit: Payer: Self-pay | Admitting: Internal Medicine

## 2021-11-07 MED ORDER — HYDRALAZINE HCL 100 MG PO TABS: 100.0000 mg | ORAL_TABLET | Freq: Three times a day (TID) | ORAL | 1 refills | Status: AC

## 2021-11-07 NOTE — Telephone Encounter (Signed)
Patient called to schedule Hospital follow up and get refill for hydrALAZINE (APRESOLINE) 100 MG tablet.I let patient know that it may not be refilled because it is coming up as a different prescribing doctor.      Please send to  [place], [place]. Phone:  [phone]  Fax:  [phone]         Please advise

## 2021-11-07 NOTE — Telephone Encounter (Signed)
Patient has follow up appointment 11/10/21. Okay to refill?

## 2021-11-07 NOTE — Telephone Encounter (Signed)
Rx sent 

## 2021-11-08 ENCOUNTER — Ambulatory Visit: Payer: Medicare Other | Admitting: Internal Medicine

## 2021-11-09 ENCOUNTER — Encounter: Payer: Self-pay | Admitting: Internal Medicine

## 2021-11-10 ENCOUNTER — Inpatient Hospital Stay: Payer: Medicare Other | Admitting: Internal Medicine

## 2021-11-11 ENCOUNTER — Encounter (HOSPITAL_COMMUNITY): Payer: Self-pay

## 2021-11-11 ENCOUNTER — Inpatient Hospital Stay (HOSPITAL_COMMUNITY)
Admission: EM | Admit: 2021-11-11 | Discharge: 2021-11-16 | DRG: 314 | Disposition: A | Discharge status: Home Health | Payer: Medicare Other | Attending: Internal Medicine | Admitting: Internal Medicine

## 2021-11-11 ENCOUNTER — Inpatient Hospital Stay (HOSPITAL_COMMUNITY): Payer: Medicare Other

## 2021-11-11 ENCOUNTER — Emergency Department (HOSPITAL_COMMUNITY): Payer: Medicare Other

## 2021-11-11 ENCOUNTER — Other Ambulatory Visit: Payer: Self-pay

## 2021-11-11 DIAGNOSIS — Z881 Allergy status to other antibiotic agents status: Secondary | ICD-10-CM

## 2021-11-11 DIAGNOSIS — R109 Unspecified abdominal pain: Secondary | ICD-10-CM | POA: Diagnosis not present

## 2021-11-11 DIAGNOSIS — I1310 Hypertensive heart and chronic kidney disease without heart failure, with stage 1 through stage 4 chronic kidney disease, or unspecified chronic kidney disease: Secondary | ICD-10-CM | POA: Diagnosis present

## 2021-11-11 DIAGNOSIS — G40909 Epilepsy, unspecified, not intractable, without status epilepticus: Secondary | ICD-10-CM | POA: Diagnosis present

## 2021-11-11 DIAGNOSIS — K50818 Crohn's disease of both small and large intestine with other complication: Secondary | ICD-10-CM | POA: Diagnosis present

## 2021-11-11 DIAGNOSIS — R509 Fever, unspecified: Secondary | ICD-10-CM | POA: Diagnosis present

## 2021-11-11 DIAGNOSIS — Z789 Other specified health status: Secondary | ICD-10-CM | POA: Diagnosis not present

## 2021-11-11 DIAGNOSIS — R911 Solitary pulmonary nodule: Secondary | ICD-10-CM | POA: Diagnosis present

## 2021-11-11 DIAGNOSIS — A4159 Other Gram-negative sepsis: Secondary | ICD-10-CM | POA: Diagnosis present

## 2021-11-11 DIAGNOSIS — Z6820 Body mass index (BMI) 20.0-20.9, adult: Secondary | ICD-10-CM

## 2021-11-11 DIAGNOSIS — Z803 Family history of malignant neoplasm of breast: Secondary | ICD-10-CM | POA: Diagnosis not present

## 2021-11-11 DIAGNOSIS — B377 Candidal sepsis: Secondary | ICD-10-CM | POA: Diagnosis not present

## 2021-11-11 DIAGNOSIS — E46 Unspecified protein-calorie malnutrition: Secondary | ICD-10-CM

## 2021-11-11 DIAGNOSIS — N184 Chronic kidney disease, stage 4 (severe): Secondary | ICD-10-CM | POA: Diagnosis present

## 2021-11-11 DIAGNOSIS — R569 Unspecified convulsions: Secondary | ICD-10-CM

## 2021-11-11 DIAGNOSIS — N179 Acute kidney failure, unspecified: Secondary | ICD-10-CM | POA: Diagnosis present

## 2021-11-11 DIAGNOSIS — T80211A Bloodstream infection due to central venous catheter, initial encounter: Principal | ICD-10-CM | POA: Diagnosis present

## 2021-11-11 DIAGNOSIS — R5081 Fever presenting with conditions classified elsewhere: Secondary | ICD-10-CM | POA: Diagnosis not present

## 2021-11-11 DIAGNOSIS — K50118 Crohn's disease of large intestine with other complication: Secondary | ICD-10-CM | POA: Diagnosis not present

## 2021-11-11 DIAGNOSIS — I10 Essential (primary) hypertension: Secondary | ICD-10-CM

## 2021-11-11 DIAGNOSIS — K7689 Other specified diseases of liver: Secondary | ICD-10-CM | POA: Diagnosis present

## 2021-11-11 DIAGNOSIS — Z833 Family history of diabetes mellitus: Secondary | ICD-10-CM

## 2021-11-11 DIAGNOSIS — T80211D Bloodstream infection due to central venous catheter, subsequent encounter: Secondary | ICD-10-CM

## 2021-11-11 DIAGNOSIS — G894 Chronic pain syndrome: Secondary | ICD-10-CM | POA: Diagnosis present

## 2021-11-11 DIAGNOSIS — K912 Postsurgical malabsorption, not elsewhere classified: Secondary | ICD-10-CM | POA: Diagnosis present

## 2021-11-11 DIAGNOSIS — A4152 Sepsis due to Pseudomonas: Secondary | ICD-10-CM | POA: Diagnosis present

## 2021-11-11 DIAGNOSIS — Z885 Allergy status to narcotic agent status: Secondary | ICD-10-CM

## 2021-11-11 DIAGNOSIS — D631 Anemia in chronic kidney disease: Secondary | ICD-10-CM | POA: Diagnosis present

## 2021-11-11 DIAGNOSIS — Z832 Family history of diseases of the blood and blood-forming organs and certain disorders involving the immune mechanism: Secondary | ICD-10-CM

## 2021-11-11 DIAGNOSIS — E538 Deficiency of other specified B group vitamins: Secondary | ICD-10-CM | POA: Diagnosis present

## 2021-11-11 DIAGNOSIS — Z79899 Other long term (current) drug therapy: Secondary | ICD-10-CM

## 2021-11-11 DIAGNOSIS — R7989 Other specified abnormal findings of blood chemistry: Secondary | ICD-10-CM

## 2021-11-11 DIAGNOSIS — M810 Age-related osteoporosis without current pathological fracture: Secondary | ICD-10-CM | POA: Diagnosis present

## 2021-11-11 DIAGNOSIS — E440 Moderate protein-calorie malnutrition: Secondary | ICD-10-CM | POA: Diagnosis present

## 2021-11-11 DIAGNOSIS — K50819 Crohn's disease of both small and large intestine with unspecified complications: Secondary | ICD-10-CM | POA: Diagnosis not present

## 2021-11-11 DIAGNOSIS — K50119 Crohn's disease of large intestine with unspecified complications: Secondary | ICD-10-CM | POA: Diagnosis not present

## 2021-11-11 DIAGNOSIS — R748 Abnormal levels of other serum enzymes: Secondary | ICD-10-CM | POA: Diagnosis present

## 2021-11-11 DIAGNOSIS — Z8249 Family history of ischemic heart disease and other diseases of the circulatory system: Secondary | ICD-10-CM | POA: Diagnosis not present

## 2021-11-11 DIAGNOSIS — Z20822 Contact with and (suspected) exposure to covid-19: Secondary | ICD-10-CM | POA: Diagnosis present

## 2021-11-11 DIAGNOSIS — F32A Depression, unspecified: Secondary | ICD-10-CM | POA: Diagnosis present

## 2021-11-11 DIAGNOSIS — Z79890 Hormone replacement therapy (postmenopausal): Secondary | ICD-10-CM

## 2021-11-11 DIAGNOSIS — R7881 Bacteremia: Secondary | ICD-10-CM | POA: Diagnosis not present

## 2021-11-11 DIAGNOSIS — Z83511 Family history of glaucoma: Secondary | ICD-10-CM

## 2021-11-11 DIAGNOSIS — Z87891 Personal history of nicotine dependence: Secondary | ICD-10-CM

## 2021-11-11 DIAGNOSIS — Z888 Allergy status to other drugs, medicaments and biological substances status: Secondary | ICD-10-CM

## 2021-11-11 DIAGNOSIS — K8689 Other specified diseases of pancreas: Secondary | ICD-10-CM | POA: Diagnosis present

## 2021-11-11 DIAGNOSIS — Y848 Other medical procedures as the cause of abnormal reaction of the patient, or of later complication, without mention of misadventure at the time of the procedure: Secondary | ICD-10-CM | POA: Diagnosis present

## 2021-11-11 DIAGNOSIS — Z820 Family history of epilepsy and other diseases of the nervous system: Secondary | ICD-10-CM | POA: Diagnosis not present

## 2021-11-11 DIAGNOSIS — Z9049 Acquired absence of other specified parts of digestive tract: Secondary | ICD-10-CM

## 2021-11-11 LAB — RESP PANEL BY RT-PCR (FLU A&B, COVID) ARPGX2
Influenza A by PCR: NEGATIVE
Influenza B by PCR: NEGATIVE
SARS Coronavirus 2 by RT PCR: NEGATIVE

## 2021-11-11 LAB — I-STAT BETA HCG BLOOD, ED (MC, WL, AP ONLY): I-stat hCG, quantitative: <5 m[IU]/mL (ref <5)

## 2021-11-11 LAB — COMPREHENSIVE METABOLIC PANEL
ALT: 1418 U/L — ABNORMAL HIGH (ref 0–44)
AST: 1742 U/L — ABNORMAL HIGH (ref 15–41)
Albumin: 2.7 g/dL — ABNORMAL LOW (ref 3.5–5.0)
Alkaline Phosphatase: 588 U/L — ABNORMAL HIGH (ref 38–126)
Anion gap: 7 (ref 5–15)
BUN: 52 mg/dL — ABNORMAL HIGH (ref 6–20)
CO2: 24 mmol/L (ref 22–32)
Calcium: 8.9 mg/dL (ref 8.9–10.3)
Chloride: 106 mmol/L (ref 98–111)
Creatinine, Ser: 2.4 mg/dL — ABNORMAL HIGH (ref 0.44–1.00)
GFR, Estimated: 23 mL/min — ABNORMAL LOW (ref >60)
Glucose, Bld: 85 mg/dL (ref 70–99)
Potassium: 3.8 mmol/L (ref 3.5–5.1)
Sodium: 137 mmol/L (ref 135–145)
Total Bilirubin: 1.4 mg/dL — ABNORMAL HIGH (ref 0.3–1.2)
Total Protein: 6.2 g/dL — ABNORMAL LOW (ref 6.5–8.1)

## 2021-11-11 LAB — URINALYSIS, ROUTINE W REFLEX MICROSCOPIC
Bilirubin Urine: NEGATIVE
Glucose, UA: NEGATIVE mg/dL
Hgb urine dipstick: NEGATIVE
Ketones, ur: NEGATIVE mg/dL
Leukocytes,Ua: NEGATIVE
Nitrite: NEGATIVE
Protein, ur: 100 mg/dL — AB
Specific Gravity, Urine: 1.015 (ref 1.005–1.030)
pH: 7 (ref 5.0–8.0)

## 2021-11-11 LAB — HEPATITIS PANEL, ACUTE
HCV Ab: NONREACTIVE
Hep A IgM: NONREACTIVE
Hep B C IgM: NONREACTIVE
Hepatitis B Surface Ag: NONREACTIVE

## 2021-11-11 LAB — CBC
HCT: 25.2 % — ABNORMAL LOW (ref 36.0–46.0)
Hemoglobin: 8.1 g/dL — ABNORMAL LOW (ref 12.0–15.0)
MCH: 30.7 pg (ref 26.0–34.0)
MCHC: 32.1 g/dL (ref 30.0–36.0)
MCV: 95.5 fL (ref 80.0–100.0)
Platelets: 146 10*3/uL — ABNORMAL LOW (ref 150–400)
RBC: 2.64 MIL/uL — ABNORMAL LOW (ref 3.87–5.11)
RDW: 13.2 % (ref 11.5–15.5)
WBC: 2.6 10*3/uL — ABNORMAL LOW (ref 4.0–10.5)
nRBC: 0 % (ref 0.0–0.2)

## 2021-11-11 LAB — URINALYSIS, MICROSCOPIC (REFLEX)

## 2021-11-11 LAB — PROTIME-INR
INR: 1 (ref 0.8–1.2)
Prothrombin Time: 13.6 seconds (ref 11.4–15.2)

## 2021-11-11 LAB — LIPASE, BLOOD: Lipase: 25 U/L (ref 11–51)

## 2021-11-11 MED ORDER — METRONIDAZOLE 500 MG/100ML IV SOLN
500.0000 mg | Freq: Two times a day (BID) | INTRAVENOUS | Status: DC
  Administered 2021-11-11: 500 mg via INTRAVENOUS
  Filled 2021-11-11: qty 100

## 2021-11-11 MED ORDER — METHOCARBAMOL 500 MG PO TABS
500.0000 mg | ORAL_TABLET | Freq: Two times a day (BID) | ORAL | Status: DC
  Administered 2021-11-11 – 2021-11-16 (×10): 500 mg via ORAL
  Filled 2021-11-11 (×10): qty 1

## 2021-11-11 MED ORDER — HYDROMORPHONE HCL 1 MG/ML IJ SOLN
0.5000 mg | INTRAMUSCULAR | Status: DC | PRN
  Administered 2021-11-11 – 2021-11-16 (×27): 0.5 mg via INTRAVENOUS
  Filled 2021-11-11 (×27): qty 1

## 2021-11-11 MED ORDER — SODIUM CHLORIDE 0.9 % IV SOLN
2.0000 g | INTRAVENOUS | Status: DC
  Administered 2021-11-11: 2 g via INTRAVENOUS
  Filled 2021-11-11: qty 20

## 2021-11-11 MED ORDER — LEVETIRACETAM IN NACL 500 MG/100ML IV SOLN
500.0000 mg | Freq: Two times a day (BID) | INTRAVENOUS | Status: DC
  Administered 2021-11-11 – 2021-11-16 (×10): 500 mg via INTRAVENOUS
  Filled 2021-11-11 (×12): qty 100

## 2021-11-11 MED ORDER — HYDROMORPHONE HCL 1 MG/ML IJ SOLN
1.0000 mg | Freq: Once | INTRAMUSCULAR | Status: AC
  Administered 2021-11-11: 1 mg via INTRAVENOUS
  Filled 2021-11-11: qty 1

## 2021-11-11 MED ORDER — CYCLOSPORINE 0.05 % OP EMUL
1.0000 [drp] | Freq: Two times a day (BID) | OPHTHALMIC | Status: DC
  Administered 2021-11-11 – 2021-11-16 (×11): 1 [drp] via OPHTHALMIC
  Filled 2021-11-11 (×12): qty 30

## 2021-11-11 MED ORDER — BUPROPION HCL ER (SR) 100 MG PO TB12
200.0000 mg | ORAL_TABLET | Freq: Every day | ORAL | Status: DC
  Administered 2021-11-11 – 2021-11-16 (×6): 200 mg via ORAL
  Filled 2021-11-11 (×6): qty 2

## 2021-11-11 MED ORDER — METHADONE HCL 5 MG PO TABS
5.0000 mg | ORAL_TABLET | Freq: Every day | ORAL | Status: DC
  Administered 2021-11-11 – 2021-11-16 (×24): 5 mg via ORAL
  Filled 2021-11-11 (×24): qty 1

## 2021-11-11 MED ORDER — PROMETHAZINE HCL 25 MG RE SUPP
25.0000 mg | RECTAL | Status: DC | PRN
  Filled 2021-11-11: qty 1

## 2021-11-11 MED ORDER — HYDROMORPHONE HCL 2 MG PO TABS
4.0000 mg | ORAL_TABLET | Freq: Four times a day (QID) | ORAL | Status: DC | PRN
  Administered 2021-11-11 – 2021-11-15 (×2): 4 mg via ORAL
  Filled 2021-11-11 (×5): qty 2

## 2021-11-11 MED ORDER — ACETAMINOPHEN 325 MG PO TABS: 650.0000 mg | ORAL_TABLET | Freq: Four times a day (QID) | ORAL | Status: DC | PRN

## 2021-11-11 MED ORDER — TEDUGLUTIDE (RDNA) 5 MG ~~LOC~~ KIT: 1.5000 mg | PACK | Freq: Every day | SUBCUTANEOUS | Status: DC

## 2021-11-11 MED ORDER — ONDANSETRON HCL 4 MG PO TABS: 4.0000 mg | ORAL_TABLET | Freq: Four times a day (QID) | ORAL | Status: DC | PRN

## 2021-11-11 MED ORDER — HYDRALAZINE HCL 20 MG/ML IJ SOLN: 5.0000 mg | INTRAMUSCULAR | Status: DC | PRN

## 2021-11-11 MED ORDER — PANTOPRAZOLE SODIUM 40 MG IV SOLR
40.0000 mg | INTRAVENOUS | Status: DC
Start: 2021-11-11 — End: 2021-11-17
  Administered 2021-11-11 – 2021-11-16 (×6): 40 mg via INTRAVENOUS
  Filled 2021-11-11 (×6): qty 40

## 2021-11-11 MED ORDER — ONDANSETRON HCL 4 MG/2ML IJ SOLN
4.0000 mg | Freq: Four times a day (QID) | INTRAMUSCULAR | Status: DC | PRN
  Administered 2021-11-11 – 2021-11-15 (×3): 4 mg via INTRAVENOUS
  Filled 2021-11-11 (×3): qty 2

## 2021-11-11 MED ORDER — ACETAMINOPHEN 650 MG RE SUPP: 650.0000 mg | Freq: Four times a day (QID) | RECTAL | Status: DC | PRN

## 2021-11-11 MED ORDER — HYDRALAZINE HCL 25 MG PO TABS
100.0000 mg | ORAL_TABLET | Freq: Three times a day (TID) | ORAL | Status: DC
  Administered 2021-11-11 – 2021-11-16 (×17): 100 mg via ORAL
  Filled 2021-11-11 (×18): qty 4

## 2021-11-11 MED ORDER — SODIUM CHLORIDE 0.9 % IV BOLUS
1000.0000 mL | Freq: Once | INTRAVENOUS | Status: AC
  Administered 2021-11-11: 1000 mL via INTRAVENOUS

## 2021-11-11 MED ORDER — CARVEDILOL 12.5 MG PO TABS
12.5000 mg | ORAL_TABLET | Freq: Two times a day (BID) | ORAL | Status: DC
  Administered 2021-11-11 – 2021-11-16 (×12): 12.5 mg via ORAL
  Filled 2021-11-11: qty 1
  Filled 2021-11-11: qty 4
  Filled 2021-11-11 (×10): qty 1

## 2021-11-11 MED ORDER — SODIUM CHLORIDE 0.9 % IV SOLN
1.0000 g | Freq: Two times a day (BID) | INTRAVENOUS | Status: DC
  Administered 2021-11-11: 1 g via INTRAVENOUS
  Filled 2021-11-11: qty 1

## 2021-11-11 MED ORDER — DULOXETINE HCL 60 MG PO CPEP
120.0000 mg | ORAL_CAPSULE | Freq: Every day | ORAL | Status: DC
  Administered 2021-11-11 – 2021-11-16 (×6): 120 mg via ORAL
  Filled 2021-11-11 (×6): qty 2

## 2021-11-11 MED ORDER — AMLODIPINE BESYLATE 10 MG PO TABS
10.0000 mg | ORAL_TABLET | Freq: Every day | ORAL | Status: DC
  Administered 2021-11-11 – 2021-11-16 (×6): 10 mg via ORAL
  Filled 2021-11-11 (×5): qty 1
  Filled 2021-11-11: qty 2

## 2021-11-11 MED ORDER — TEDUGLUTIDE (RDNA) 5 MG ~~LOC~~ KIT
1.5000 mg | PACK | Freq: Every day | SUBCUTANEOUS | Status: DC
  Filled 2021-11-11: qty 1

## 2021-11-11 MED ORDER — SODIUM CHLORIDE 0.9 % IV SOLN: INTRAVENOUS | Status: DC

## 2021-11-11 MED ORDER — CHLORHEXIDINE GLUCONATE CLOTH 2 % EX PADS
6.0000 | MEDICATED_PAD | Freq: Every day | CUTANEOUS | Status: DC
  Administered 2021-11-11 – 2021-11-15 (×5): 6 via TOPICAL

## 2021-11-11 MED ORDER — POLYVINYL ALCOHOL 1.4 % OP SOLN
1.0000 [drp] | OPHTHALMIC | Status: DC | PRN
  Filled 2021-11-11: qty 15

## 2021-11-11 MED ORDER — ENOXAPARIN SODIUM 30 MG/0.3ML IJ SOSY
30.0000 mg | PREFILLED_SYRINGE | INTRAMUSCULAR | Status: DC
  Administered 2021-11-11 – 2021-11-12 (×2): 30 mg via SUBCUTANEOUS
  Filled 2021-11-11 (×2): qty 0.3

## 2021-11-11 NOTE — ED Triage Notes (Signed)
Pt comes via [facility] for the past week has been having fevers and generalized body aches and abd pain

## 2021-11-11 NOTE — ED Provider Notes (Signed)
[facility] Emergency Department Provider Note MRN:  [id number]  Arrival date & time: 11/11/21     Chief Complaint   Abdominal Pain   History of Present Illness   Caitlin Peters is a 60 y.o. year-old female with a history of Crohn's disease presenting to the ED with chief complaint of abdominal pain.  Location: Diffuse Duration: Chronic pain worse over the past few days Onset: Rash Timing: Constant Description: Ache Severity: Moderate Exacerbating/Alleviating Factors: None Associated Symptoms: Fever Pertinent Negatives: No chest pain  Additional History: None   Review of Systems  A complete 10 system review of systems was obtained and all systems are negative except as noted in the HPI and PMH.   Patient's Health History    Past Medical History:  Diagnosis Date   Acute pancreatitis 04/13/2020   Anasarca 10/2019   AVN (avascular necrosis of bone) ([name])    Cataract    Choledocholithiasis (sludge) s/p ERCP 10/2019 10/21/2020   Chronic pain syndrome    CKD (chronic kidney disease), stage III ([place])    Crohn disease ([place])    Crohn's disease of small & large intestine with SGS 1984   Caitlin Peters is a 60 year old female with a history of Crohn's disease diagnosed in 66 (age 68), history of long-term steroid use with osteoporosis, S/P multiple bowel resections ([id number]) complicated by chronic back and abdominal pain, steatorrhea and short bowel syndrome. The patient has been left with ~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 attached to proximal transverse colon through rectum. 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; 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; jejunal  to transverse colon anastomosis Treated at [facility] SB lengthening to 165cm Dr Alene Mires, [facility]   Vitamin B12 deficiency     Past Surgical History:  Procedure Laterality Date   ABDOMINAL ADHESION SURGERY  01/22/2018   [place]  11/26/2019   Procedure: BILIARY DILATION;  Surgeon: Jackquline Denmark, MD;  Location: [facility];  Service: Endoscopy;;   BILIARY DILATION  03/08/2020   Procedure: BILIARY DILATION;  Surgeon: Irving Copas., MD;  Location: [place] [facility];  Service: Gastroenterology;;   BIOPSY  03/08/2020   Procedure: BIOPSY;  Surgeon: Irving Copas., MD;  Location: [facility];  Service: Gastroenterology;;   CHEST WALL RESECTION     right thoracotomy,resection of chest mass with anterior rib and reconstruction using prosthetic mesh and video arthroscopy   CHOLECYSTECTOMY  01/22/2018   COLONOSCOPY  2019   ENTEROSTOMY CLOSURE  04/1999   ERCP N/A 11/26/2019   Procedure: ENDOSCOPIC RETROGRADE CHOLANGIOPANCREATOGRAPHY (ERCP);  Surgeon: Jackquline Denmark, MD;  Location: [facility];  Service: Endoscopy;  Laterality: N/A;   ERCP N/A 03/08/2020   Procedure: ENDOSCOPIC RETROGRADE CHOLANGIOPANCREATOGRAPHY (ERCP);  Surgeon: Irving Copas., MD;  Location: [facility];  Service: Gastroenterology;  Laterality: N/A;   ESOPHAGOGASTRODUODENOSCOPY N/A 03/08/2020   Procedure: ESOPHAGOGASTRODUODENOSCOPY (EGD);  Surgeon: Irving Copas., MD;  Location: [facility];  Service: Gastroenterology;  Laterality: N/A;   EUS N/A 03/08/2020   Procedure: UPPER ENDOSCOPIC ULTRASOUND (EUS) LINEAR;  Surgeon: Irving Copas., MD;  Location: [facility];  Service: Gastroenterology;  Laterality: N/A;   ILEOCECETOMY  03/1999   ileocolon resection with abdominal stoma   ILEOSTOMY CLOSURE  2001   IR FLUORO GUIDE CV LINE LEFT  01/07/2020   IR FLUORO GUIDE CV LINE LEFT  03/09/2020   IR FLUORO GUIDE CV LINE LEFT   05/09/2020   IR FLUORO GUIDE CV LINE LEFT  07/20/2020   IR FLUORO GUIDE CV LINE RIGHT  08/05/2020   IR FLUORO GUIDE CV LINE RIGHT  04/03/2021   IR PTA VENOUS EXCEPT DIALYSIS CIRCUIT  01/07/2020   IR REMOVAL TUN CV CATH W/O FL  08/05/2020   IR REMOVAL TUN CV CATH W/O FL  07/11/2021   IR US GUIDE VASC ACCESS LEFT     x 2 06/17/19 and 09/14/2019   IR US GUIDE VASC ACCESS RIGHT  08/05/2020   KNEE SURGERY     right knee    LAPAROSCOPIC SMALL BOWEL RESECTION  2009   2000-2009.  SB resections for Crohns Disease - now with Short gut   OMENTECTOMY  01/22/2018   PARTIAL HYSTERECTOMY  1984   with LSO   REMOVAL OF STONES  11/26/2019   Procedure: REMOVAL OF STONES;  Surgeon: Jackquline Denmark, MD;  Location: [facility];  Service: Endoscopy;;   REMOVAL OF STONES  03/08/2020   Procedure: REMOVAL OF STONES;  Surgeon: Irving Copas., MD;  [facility];  Service: Gastroenterology;;   SALPINGOOPHORECTOMY Left 1984   SALPINGOOPHORECTOMY Right 1990   SERIAL TRANSVERSE ENTEROPLASTY (STEP) - SMALL BOWEL LENGTHENING  01/22/2018   Dr Alene Mires, [facility] - SB length from 120 to 165cm    SMALL INTESTINE SURGERY  2002   SMALL INTESTINE SURGERY  2003   SPHINCTEROTOMY  11/26/2019   Procedure: SPHINCTEROTOMY;  Surgeon: Jackquline Denmark, MD;  Location: [facility];  Service: Endoscopy;;   TOTAL ABDOMINAL HYSTERECTOMY  1990   with RSO   UPPER GASTROINTESTINAL ENDOSCOPY      Family History  Problem Relation Age of Onset   Seizures Mother    Glaucoma Mother    CAD Father    Heart disease Father    Hypertension Father    Breast cancer Sister    Multiple sclerosis Sister    Diabetes Sister    Lupus Sister    Colon cancer Other    Crohn's disease Other     Social History   Socioeconomic History   Marital status: Single    Spouse name: Not on file   Number of children: Not on file   Years of education: Not on file   Highest education level: Not on file  Occupational History   Occupation:  disabled  Tobacco Use   Smoking status: Former   Smokeless tobacco: Never  Scientific laboratory technician Use: Never used  Substance and Sexual Activity   Alcohol use: Not Currently   Drug use: Never   Sexual activity: Yes  Other Topics Concern   Not on file  Social History Narrative   Right handed   One story apartment   No caffeine   Social Determinants of Health   Financial Resource Strain: Not on file  Food Insecurity: Not on file  Transportation Needs: Not on file  Physical Activity: Not on file  Stress: Not on file  Social Connections: Not on file  Intimate Partner Violence: Not on file     Physical Exam   Vitals:   11/11/21 0640 11/11/21  0642  BP: 132/61   Pulse: 67 67  Resp: 16   Temp: 98.8 F (37.1 C)   SpO2: 98% 98%    CONSTITUTIONAL: Chronically ill-appearing, NAD NEURO:  Alert and oriented x 3, no focal deficits EYES:  eyes equal and reactive ENT/NECK:  no LAD, no JVD CARDIO: Regular rate, well-perfused, normal S1 and S2 PULM:  CTAB no wheezing or rhonchi GI/GU:  normal bowel sounds, non-distended, mild diffuse tenderness MSK/SPINE:  No gross deformities, no edema SKIN:  no rash, atraumatic PSYCH:  Appropriate speech and behavior  *Additional and/or pertinent findings included in MDM below  Diagnostic and Interventional Summary    EKG Interpretation  Date/Time:    Ventricular Rate:    PR Interval:    QRS Duration:   QT Interval:    QTC Calculation:   R Axis:     Text Interpretation:         Labs Reviewed  COMPREHENSIVE METABOLIC PANEL - Abnormal; Notable for the following components:      Result Value   BUN 52 (*)    Creatinine, Ser 2.40 (*)    Total Protein 6.2 (*)    Albumin 2.7 (*)    AST 1,742 (*)    ALT 1,418 (*)    Alkaline Phosphatase 588 (*)    Total Bilirubin 1.4 (*)    GFR, Estimated 23 (*)    All other components within normal limits  CBC - Abnormal; Notable for the following components:   WBC 2.6 (*)    RBC 2.64 (*)     Hemoglobin 8.1 (*)    HCT 25.2 (*)    Platelets 146 (*)    All other components within normal limits  URINALYSIS, ROUTINE W REFLEX MICROSCOPIC - Abnormal; Notable for the following components:   Protein, ur 100 (*)    All other components within normal limits  URINALYSIS, MICROSCOPIC (REFLEX) - Abnormal; Notable for the following components:   Bacteria, UA RARE (*)    All other components within normal limits  CULTURE, BLOOD (ROUTINE X 2)  CULTURE, BLOOD (ROUTINE X 2)  LIPASE, BLOOD  PROTIME-INR  HEPATITIS PANEL, ACUTE  I-STAT BETA HCG BLOOD, ED ([facility], [facility], [facility] ONLY)    CT ABDOMEN PELVIS WO CONTRAST  Final Result      Medications  cefTRIAXone (ROCEPHIN) 2 g in sodium chloride 0.9 % 100 mL IVPB (has no administration in time range)  metroNIDAZOLE (FLAGYL) IVPB 500 mg (has no administration in time range)  sodium chloride 0.9 % bolus 1,000 mL (1,000 mLs Intravenous New Bag/Given 11/11/21 0636)  HYDROmorphone (DILAUDID) injection 1 mg (1 mg Intravenous Given 11/11/21 6384)     Procedures  /  Critical Care Procedures  ED Course and Medical Decision Making  I have reviewed the triage vital signs, the nursing notes, and pertinent available records from the EMR.  Listed above are laboratory and imaging tests that I personally ordered, reviewed, and interpreted and then considered in my medical decision making (see below for details).  Abdominal pain, fever, transaminitis.  Differential diagnosis includes cholecystitis, choledocholithiasis, cholangitis, acute hepatitis.  Awaiting CT imaging.  History of Crohn's, also considering Crohn's flare, intra-abdominal abscess.  Also with recent history of PICC line infections.  Anticipating admission.       Barth Kirks. Sedonia Small, [place] [email or web address]  Final Clinical Impressions(s) / ED Diagnoses     ICD-10-CM   1. Fever, unspecified fever cause  R50.9  2. Abdominal pain,  unspecified abdominal location  R10.9     3. LFT elevation  R79.89       ED Discharge Orders     None        Discharge Instructions Discussed with and Provided to Patient:   Discharge Instructions   None       Maudie Flakes, MD 11/11/21 [id number]

## 2021-11-11 NOTE — Consult Note (Signed)
[facility] Consult: 1:13 PM 11/11/2021  LOS: 0 days    Referring Provider: Dr. Lorin Mercy. Primary Care Physician:  [name], [name], MD Primary Gastroenterologist: Dr.Jain at [facility].    Reason for Consultation: Abnormal LFTs.   HPI: Caitlin Peters is a 60 y.o. female.  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  Followed previously at [facility] clinic.  TPN dependent since 2003.  Iron deficiency anemia treated with parenteral iron.  Protein calorie malnutrition.  Has required PICC line placement.  Multiple admissions for line infections, sepsis.  Frequent electrolyte deficiencies.  Osteoporosis.  B12 deficient.  CKD stage IV.  Hip, knee avascular necrosis.  Discitis.  Hypothyroidism.  In addition to intestinal surgeries she is undergone partial hysterectomy, cholecystectomy.  10/2019 ERCP for suspected a sending cholangitis, prior cholecystectomy.  Dr. Lyndel Safe encountered benign-appearing biliary papillary stenosis, choledocholithiasis.  She underwent sphincterotomy, sphincteroplasty and balloon sweep of bile duct.  No evidence of PSC. 02/2020 EUS/ERCP By Dr. Rush Landmark.  To evaluate CBD, PD dilatation at MRCP, elevated LFTs.  J-shaped deformity of stomach, gastric erythema which was biopsied.  Angulation deformity of duodenum.  ERCP (Endoscopicretrogradecholangiopancreatography) revealed prior sphincterotomy, MD perform sphincteroplasty.  Entire main bile duct dilated.  Biliary tree swept, nothing was found or emerged at sweep.  EUS showed mild dilatation of CBD but no stones or sludge present.  The pancreatic duct was dilated in the region of the head body and tail.  No significant pancreatic parenchymal disease.  Nonmalignant looking lymph  nodes at celiac, peripancreatic and porta hepatis region.  Studies at [facility] include 10/25/2020 CTAP without contrast which showed air-filled branching tubules in the left hepatic lobe favoring pneumobilia though portal venous gas difficult to exclude.  CBD distended and filled with air.  Small amount of enteric contrast at the distal bile duct suggesting incompetent sphincter of Oddi.  No pneumatosis.  Extensive postsurgical changes of small and large bowel.  Scattered luminal narrowing/mural thickening in the colon. 10/27/2020 colonoscopy which showed no active Crohn's, CRP was normal.   Dr. Domenica Fail at [facility] was trying to get her evaluated for small bowel transplant at [facility] in [place].  Admission November 12 through 21 2022 for worsening diarrhea, abdominal pain, fever, anorexia.  CT showed diffuse colitis, ileus versus enteritis in dilated loops of small bowel, developing PSBO not excluded.  New pneumobilia.  Diagnosed with Klebsiella and Acinetobacter infection, treated with IV Levaquin, Cipro, line left in place.  LFTs were elevated but resolving by discharge.  CTAP 10/08/2021 showed distended loops of small bowel similar to previous.  Changes of right hemicolectomy, large amount of stool in residual colon, rectum, anasarca.  Mild intra and extrahepatic biliary ductal dilatation.  Diffuse dilatation of pancreatic duct unchanged from previous. Her PICC line was replaced on 12/9 at [facility].  Presented to the hospital via EMS early this morning with chills, body aches, abdominal pain, fever to 100.   Abdominal pain is mild diffuse and is typical for her periodic abdominal pain.  Some nausea which  is not unusual for her.  No emesis.  Stools are brown, soft.  At arrival her temperature was 101.6.  Blood pressures normotensive, no tachycardia, no hypoxia.  Elevated LFTs.  Between 11/21 and 12/12 T bili 0.4.  Alk phos Foss 130..  274.  AST/ALT 40/62..  275/260.[name] Kitchen  86/229. Today: T bili 0.5.  Alkaline  phosphatase 588.  AST/ALT 1742/1418.  Lipase 25. CTAP without contrast today: Mild intrahepatic, extrahepatic biliary prominence is stable.  Surgical changes of right hemicolectomy, chronic dilatation of small bowel with fluid levels in the lower abdomen possible increased rectal wall thickening.  Body wall anasarca.  Cardiomegaly.  New finding of 5 mm nodule in left lower lobe.  Cystitis versus bladder nondistention. Ultrasound abdomen: Increased echogenicity right kidney suggesting medical renal disease.  Portal vein Dopplers normal.  CBD 7.5 mm.    Past Medical History:  Diagnosis Date   Acute pancreatitis 04/13/2020   Anasarca 10/2019   AVN (avascular necrosis of bone) ([name])    Cataract    Choledocholithiasis (sludge) s/p ERCP 10/2019 10/21/2020   Chronic pain syndrome    CKD (chronic kidney disease), stage III ([name])    Depression    Diverticulosis    GERD (gastroesophageal reflux disease)    HTN (hypertension)    IDA (iron deficiency anemia)    Malnutrition ([place])    Mass in chest    Osteoporosis 12/24/2014   Pancreatitis    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; 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; jejunal to transverse colon anastomosis Treated at [place] [facility] lengthening to 165cm Dr Alene Mires, [facility]   Vitamin B12 deficiency     Past Surgical History:  Procedure Laterality Date   ABDOMINAL ADHESION SURGERY  01/22/2018   APPENDECTOMY  1989   BILIARY DILATION  11/26/2019   Procedure: BILIARY DILATION;  Surgeon: Jackquline Denmark, MD;  Location: [facility];  Service: Endoscopy;;   BILIARY DILATION  03/08/2020   Procedure: BILIARY DILATION;  Surgeon: Irving Copas., MD;  Location: [place];  Service: Gastroenterology;;   BIOPSY  03/08/2020   Procedure: BIOPSY;  Surgeon: Irving Copas., MD;  Location: [facility] ENDOSCOPY;  Service: Gastroenterology;;   CHEST  WALL RESECTION     right thoracotomy,resection of chest mass with anterior rib and reconstruction using prosthetic mesh and video arthroscopy   CHOLECYSTECTOMY  01/22/2018   COLONOSCOPY  2019   ENTEROSTOMY CLOSURE  04/1999   ERCP N/A 11/26/2019   Procedure: ENDOSCOPIC RETROGRADE CHOLANGIOPANCREATOGRAPHY (ERCP);  Surgeon: Jackquline Denmark, MD;  Location: [facility];  Service: Endoscopy;  Laterality: N/A;   ERCP N/A 03/08/2020   Procedure: ENDOSCOPIC RETROGRADE CHOLANGIOPANCREATOGRAPHY (ERCP);  Surgeon: Irving Copas., MD;  Location: [place];  Service: Gastroenterology;  Laterality: N/A;   ESOPHAGOGASTRODUODENOSCOPY N/A 03/08/2020   Procedure: ESOPHAGOGASTRODUODENOSCOPY (EGD);  Surgeon: Irving Copas., MD;  Location: [place];  Service: Gastroenterology;  Laterality: N/A;   EUS N/A 03/08/2020   Procedure: UPPER ENDOSCOPIC ULTRASOUND (EUS) LINEAR;  Surgeon: Irving Copas., MD;  Location: [place];  Service: Gastroenterology;  Laterality: N/A;   ILEOCECETOMY  03/1999   ileocolon resection with abdominal stoma   ILEOSTOMY CLOSURE  2001   IR FLUORO GUIDE CV LINE LEFT  01/07/2020   IR FLUORO GUIDE CV LINE LEFT  03/09/2020   IR FLUORO GUIDE CV LINE LEFT  05/09/2020   IR FLUORO GUIDE CV LINE LEFT  07/20/2020  IR FLUORO GUIDE CV LINE RIGHT  08/05/2020   IR FLUORO GUIDE CV LINE RIGHT  04/03/2021   IR PTA VENOUS EXCEPT DIALYSIS CIRCUIT  01/07/2020   IR REMOVAL TUN CV CATH W/O FL  08/05/2020   IR REMOVAL TUN CV CATH W/O FL  07/11/2021   IR US GUIDE VASC ACCESS LEFT     x 2 06/17/19 and 09/14/2019   IR US GUIDE VASC ACCESS RIGHT  08/05/2020   KNEE SURGERY     right knee    LAPAROSCOPIC SMALL BOWEL RESECTION  2009   2000-2009.  SB resections for Crohns Disease - now with Short gut   OMENTECTOMY  01/22/2018   PARTIAL HYSTERECTOMY  1984   with LSO   REMOVAL OF STONES  11/26/2019   Procedure: REMOVAL OF STONES;  Surgeon: Jackquline Denmark, MD;  Location: [facility];  Service:  Endoscopy;;   REMOVAL OF STONES  03/08/2020   Procedure: REMOVAL OF STONES;  Surgeon: Irving Copas., MD;  Location: [facility] ENDOSCOPY;  Service: Gastroenterology;;   SALPINGOOPHORECTOMY Left 1984   SALPINGOOPHORECTOMY Right 1990   SERIAL TRANSVERSE ENTEROPLASTY (STEP) - SMALL BOWEL LENGTHENING  01/22/2018   Dr Alene Mires, [facility] - SB length from 120 to 165cm    SMALL INTESTINE SURGERY  2002   SMALL INTESTINE SURGERY  2003   SPHINCTEROTOMY  11/26/2019   Procedure: SPHINCTEROTOMY;  Surgeon: Jackquline Denmark, MD;  Location: [facility];  Service: Endoscopy;;   TOTAL ABDOMINAL HYSTERECTOMY  1990   with RSO   UPPER GASTROINTESTINAL ENDOSCOPY      Prior to Admission medications   Medication Sig Start Date End Date Taking? Authorizing Provider  acetaminophen (TYLENOL) 650 MG CR tablet Take 1,300 mg by mouth every 6 (six) hours.   Yes [provider]  amLODipine (NORVASC) 10 MG tablet Take 1 tablet by mouth once daily Patient taking differently: Take 10 mg by mouth daily. 08/22/21  Yes [name], [name], MD  buPROPion ER [facility] SR) 100 MG 12 hr tablet Take 2 tablets by mouth once daily Patient taking differently: Take 200 mg by mouth daily. 08/30/21  Yes [name], [name], MD  Calcium 500 MG tablet Take 500 mg by mouth daily.   Yes [provider]  carvedilol (COREG) 12.5 MG tablet Take 1 tablet (12.5 mg total) by mouth 2 (two) times daily with a meal. 09/29/21  Yes Little Ishikawa, MD  cholecalciferol (VITAMIN D3) 25 MCG (1000 UT) tablet Take 1,000 Units by mouth daily.    Yes [provider]  cycloSPORINE (RESTASIS) 0.05 % ophthalmic emulsion Place 1 drop into both eyes 2 (two) times daily.   Yes [provider]  DEXILANT 60 MG capsule Take 1 capsule by mouth once daily Patient taking differently: Take 60 mg by mouth daily. 08/01/21  Yes [name], [name], MD  diphenoxylate-atropine (LOMOTIL) 2.5-0.025 MG tablet Take  1 tablet by mouth 2 (two) times daily as needed for diarrhea or loose stools. 09/04/21  Yes [provider]  DULoxetine (CYMBALTA) 60 MG capsule Take 2 capsules (120 mg total) by mouth daily. 09/15/21  Yes [name], [name], MD  estradiol (ESTRACE) 2 MG tablet Take 1 tablet by mouth once daily Patient taking differently: Take 2 mg by mouth daily. 10/03/21  Yes [name], [name], MD  famotidine (PEPCID) 20 MG tablet Take 20 mg by mouth daily as needed for heartburn or indigestion.   Yes [provider]  GATTEX 5 MG  KIT Inject 1.5 mg into the skin daily. 01/03/21  Yes [provider]  hydrALAZINE (APRESOLINE) 100 MG tablet Take 1 tablet (100 mg total) by mouth 3 (three) times daily. 11/07/21  Yes [name], [name], MD  HYDROmorphone (DILAUDID) 4 MG tablet Take 4 mg by mouth 4 (four) times daily. 04/18/21  Yes [provider]  levETIRAcetam (KEPPRA) 500 MG tablet Take 1 tablet (500 mg total) by mouth 2 (two) times daily. 08/28/21  Yes Cameron Sprang, MD  lipase/protease/amylase (CREON) 36000 UNITS CPEP capsule Take 1 capsule (36,000 Units total) by mouth See admin instructions. 36000 units with meals Patient taking differently: Take 36,000 Units by mouth 3 (three) times daily with meals. 06/30/21  Yes Eugenie Filler, MD  loperamide (IMODIUM) 2 MG capsule Take 4 mg by mouth 2 (two) times daily as needed for diarrhea or loose stools. 09/26/21  Yes [provider]  methadone (DOLOPHINE) 5 MG tablet Take 1 tablet (5 mg total) by mouth 5 (five) times daily. 09/29/21  Yes Little Ishikawa, MD  methocarbamol (ROBAXIN) 500 MG tablet Take 1 tablet (500 mg total) by mouth in the morning and at bedtime. 06/13/21  Yes [name], [name], MD  Multiple Vitamins-Minerals (MULTIVITAMIN ADULT PO) Take 1 tablet by mouth daily.   Yes [provider]  polyvinyl alcohol (LIQUIFILM TEARS) 1.4 % ophthalmic solution Place 1 drop into both  eyes as needed for dry eyes.   Yes [provider]  PRESCRIPTION MEDICATION Inject 1 each into the vein daily. Home TPN . [facility] in [facility] . 1 bag for 12 hours. [phone]   Yes [provider]  promethazine (PHENERGAN) 25 MG tablet TAKE 1 TABLET BY MOUTH EVERY 6 HOURS AS NEEDED FOR NAUSEA FOR VOMITING Patient taking differently: Take 25 mg by mouth every 6 (six) hours as needed for nausea or vomiting. 10/25/21  Yes [name], [name], MD  sodium chloride 0.9 % infusion Inject 1,000 mLs into the vein daily. For chronic dehydration 10/16/20  Yes [provider]  sucralfate (CARAFATE) 1 GM/10ML suspension Take 10 mLs (1 g total) by mouth 2 (two) times daily. 06/30/21  Yes Eugenie Filler, MD  Trace Minerals Cu-Mn-Se-Zn (TRALEMENT IV) Inject 1 mL into the vein See admin instructions. Used in TPN bag 4 times weekly   Yes [provider]  vitamin B-12 (CYANOCOBALAMIN) 1000 MCG tablet Take 1,000 mcg by mouth daily.    Yes [provider]  lidocaine (LIDODERM) 5 % Place 1 patch onto the skin daily. Remove & Discard patch within 12 hours or as directed by MD Patient not taking: Reported on 11/11/2021 06/30/21   Eugenie Filler, MD  [facility] 4 MG/0.1ML LIQD nasal spray kit Place 1 spray into the nose once as needed (overdose). Patient not taking: Reported on 10/31/2021 10/14/20   [provider]    Scheduled Meds:  amLODipine  10 mg Oral Daily   buPROPion ER  200 mg Oral Daily   carvedilol  12.5 mg Oral BID WC   cycloSPORINE  1 drop Both Eyes BID   DULoxetine  120 mg Oral Daily   enoxaparin (LOVENOX) injection  30 mg Subcutaneous Q24H   hydrALAZINE  100 mg Oral TID   methadone  5 mg Oral 5 X Daily   methocarbamol  500 mg Oral BID   pantoprazole (PROTONIX) IV  40 mg Intravenous Q24H   [START ON 11/12/2021] Teduglutide (rDNA)  1.5 mg Subcutaneous  Daily   Infusions:  sodium chloride 75 mL/hr at 11/11/21 0927    cefTRIAXone (ROCEPHIN)  IV Stopped (11/11/21 4132)   levETIRAcetam Stopped (11/11/21 4401)   metronidazole Stopped (11/11/21 1111)   PRN Meds: acetaminophen **OR** acetaminophen, hydrALAZINE, HYDROmorphone (DILAUDID) injection, HYDROmorphone, ondansetron **OR** ondansetron (ZOFRAN) IV, polyvinyl alcohol, promethazine   Allergies as of 11/11/2021 - Review Complete 11/11/2021  Allergen Reaction Noted   Meperidine Hives 08/14/2011   Hyoscyamine Hives and Swelling 07/15/2014   Cefepime Other (See Comments) 11/27/2017   Gabapentin Other (See Comments) 10/13/2019   Lyrica [pregabalin] Other (See Comments) 10/13/2019   Topamax [topiramate] Other (See Comments) 10/13/2019   Zosyn [piperacillin sod-tazobactam so] Other (See Comments) 08/03/2020   Fentanyl Rash 10/12/2019   Morphine and related Rash 10/12/2019    Family History  Problem Relation Age of Onset   Seizures Mother    Glaucoma Mother    CAD Father    Heart disease Father    Hypertension Father    Breast cancer Sister    Multiple sclerosis Sister    Diabetes Sister    Lupus Sister    Colon cancer Other    Crohn's disease Other     Social History   Socioeconomic History   Marital status: Single    Spouse name: Not on file   Number of children: Not on file   Years of education: Not on file   Highest education level: Not on file  Occupational History   Occupation: disabled  Tobacco Use   Smoking status: Former    Packs/day: 0.50    Years: 35.00    Pack years: 17.50    Types: Cigarettes   Smokeless tobacco: Never  Vaping Use   Vaping Use: Never used  Substance and Sexual Activity   Alcohol use: Not Currently   Drug use: Never   Sexual activity: Yes  Other Topics Concern   Not on file  Social History Narrative   Right handed   One story apartment   No caffeine   Social Determinants of Health   Financial Resource Strain: Not on file  Food Insecurity: Not on file  Transportation Needs: Not on file   Physical Activity: Not on file  Stress: Not on file  Social Connections: Not on file  Intimate Partner Violence: Not on file    REVIEW OF SYSTEMS: Constitutional: Malaise ENT:  No nose bleeds Pulm: No cough or shortness of breath CV:  No palpitations, no LE edema.  No angina GU:  No hematuria, no frequency GI: See HPI. Heme: Denies unusual or excessive bleeding or bruising. Transfusions: Received PRBCs in 07/15/2021 and 11/15/2019. Neuro:  No headaches, no peripheral tingling or numbness Derm:  No itching, no rash or sores.  Endocrine:  No sweats or chills.  No polyuria or dysuria Immunization: Reviewed.  She has been vaccinated for hepatitis a and hepatitis B, COVID-19 and multiple other vaccines. Travel:  None beyond local counties in last few months.    PHYSICAL EXAM: Vital signs in last 24 hours: Vitals:   11/11/21 1200 11/11/21 1245  BP: 135/62 138/67  Pulse: 66 74  Resp: 15 15  Temp:    SpO2: 96% 98%   Wt Readings from Last 3 Encounters:  11/11/21 59 kg  10/31/21 60.5 kg  10/15/21 57.4 kg    General: Thin, pleasant, comfortable.  Looks chronically unwell but not toxic Head: No facial asymmetry or swelling. Eyes: Conjunctiva pink.  EOMI. Ears: No hearing deficit Nose: No congestion or discharge  Mouth: Oropharynx mucosa moist, pink, clear.  Tongue midline.  Fair dentition. Neck: No JVD, no masses, no thyromegaly. Lungs: No labored breathing.  No cough or dyspnea. Heart: RRR.  No MRG.  S1, S2 present. Abdomen: Soft without distention.  Diffusely tender without guarding or rebound more so in the upper than the lower abdomen.  Bowel sounds active.  Surgical scars well-healed..   Rectal: Deferred Musc/Skeltl: Overall osteoporotic skeletal appearance Extremities: No CCE Neurologic: Oriented x3.  Able to provide excellent history.  Moves all 4 limbs without tremor or weakness. Skin: No sores, no rashes no suspicious lesions. Nodes: No cervical adenopathy Psych:  Cooperative, calm, pleasant affect.  Fluid speech.  Intake/Output from previous day: No intake/output data recorded. Intake/Output this shift: Total I/O In: 1300 [IV Piggyback:1300] Out: -   LAB RESULTS: Recent Labs    11/11/21 0245  WBC 2.6*  HGB 8.1*  HCT 25.2*  PLT 146*   BMET Lab Results  Component Value Date   NA 137 11/11/2021   NA 140 11/06/2021   NA 135 (A) 10/30/2021   K 3.8 11/11/2021   K 4.9 11/06/2021   K 5.4 (A) 10/30/2021   CL 106 11/11/2021   CL 105 11/06/2021   CL 100 10/30/2021   CO2 24 11/11/2021   CO2 26 (A) 11/06/2021   CO2 21 10/30/2021   GLUCOSE 85 11/11/2021   GLUCOSE 119 (H) 10/16/2021   GLUCOSE 100 (H) 10/15/2021   BUN 52 (H) 11/11/2021   BUN 53 (A) 11/06/2021   BUN 49 (A) 10/30/2021   CREATININE 2.40 (H) 11/11/2021   CREATININE 2.2 (A) 11/06/2021   CREATININE 2.3 (A) 10/30/2021   CALCIUM 8.9 11/11/2021   CALCIUM 8.9 11/06/2021   CALCIUM 9.1 10/30/2021   LFT Recent Labs    11/11/21 0245  PROT 6.2*  ALBUMIN 2.7*  AST 1,742*  ALT 1,418*  ALKPHOS 588*  BILITOT 1.4*   PT/INR Lab Results  Component Value Date   INR 1.0 11/11/2021   INR 1.1 07/10/2021   INR 1.0 06/19/2021   Hepatitis Panel No results for input(s): HEPBSAG, HCVAB, HEPAIGM, HEPBIGM in the last 72 hours. C-Diff No components found for: CDIFF Lipase     Component Value Date/Time   LIPASE 25 11/11/2021 0245    Drugs of Abuse     Component Value Date/Time   LABOPIA POSITIVE (A) 12/15/2020 1738   COCAINSCRNUR NONE DETECTED 12/15/2020 1738   LABBENZ NONE DETECTED 12/15/2020 1738   AMPHETMU NONE DETECTED 12/15/2020 1738   THCU NONE DETECTED 12/15/2020 1738   LABBARB NONE DETECTED 12/15/2020 1738     RADIOLOGY STUDIES: CT ABDOMEN PELVIS WO CONTRAST  Result Date: 11/11/2021 CLINICAL DATA:  Suspected biliary obstruction. Fever and generalized body aches. Abdominal pain. History of Crohn's disease, previous colitis. EXAM: CT ABDOMEN AND PELVIS WITHOUT  CONTRAST TECHNIQUE: Multidetector CT imaging of the abdomen and pelvis was performed following the standard protocol without IV contrast. COMPARISON:  There are numerous prior CTs, but the 3 most recent are 10/08/2021, 09/25/2021 and 07/26/2021 FINDINGS: Factors affecting image quality: Very little body fat limits subject contrast, and lack of IV and enteric contrast also limits diagnostic quality. Lower chest: No infiltrate is seen. There are scattered linear scar like opacities. Mild cardiomegaly without pericardial effusion. A 5 mm noncalcified nodule has developed in the posterior basal left lower lobe on series 5 axial 30. Hepatobiliary: Right femoral approach infusion catheter is again noted terminating in the left hepatic vein at the junction with the  IVC. Left hepatic pneumobilia is present and not seen previously. The gallbladder is absent. Mild intrahepatic and extrahepatic biliary dilatation is again shown and seems unchanged. Liver parenchyma is grossly unremarkable as far seen. Pancreas: Mild ductal dilatation is again shown without adjacent fat stranding. No focal masses seen without contrast. Spleen: No focal noncontrast abnormality or enlargement. Adrenals/Urinary Tract: There is no adrenal mass. No abnormality of unenhanced renal cortex. There are no stones or hydronephrosis. There is mild bladder thickening versus nondistention. Stomach/Bowel: The gastric wall is chronically thickened proximally but no more than previously. There are multifocal intestinal surgical changes including a prior right hemicolectomy, with matted and clumped unopacified small bowel limiting evaluation of the bowel but with again noted dilatation of lower abdominal small bowel up to 3.8 cm, suggestion of at least mild wall thickening of some of the clumped segments, with moderate stool retention in the descending and sigmoid colon but no pericolic inflammatory stranding. There is possible increased wall thickening of the  rectum but no increased stranding. Vascular/Lymphatic: Aortic atherosclerosis. No enlarged abdominal or pelvic lymph nodes. Reproductive: The uterus is absent.  No adnexal mass is seen. Other: No abdominal wall hernia or abnormality. No abdominopelvic ascites. There is a general paucity of body fat, mild body wall anasarca. Musculoskeletal: No acute or significant osseous findings. IMPRESSION: 1. Mild intrahepatic and extrahepatic biliary prominence is again noted and has been present on multiple prior studies. The degree of biliary dilatation is no greater than previously but there is a small volume of left hepatic pneumobilia not seen on the last study but was seen on 09/25/2021. This could be due to prior sphincterotomy or infectious process. 2. Multifocal intestinal postsurgical changes including right hemicolectomy, with chronic dilatation of the lower abdominal small bowel with fluid levels. This is also unchanged but there may be increased rectal wall thickening on today's exam. 3. General lack of body fat with body wall anasarca, which could suggest cachexia with malnutrition. 4. Cardiomegaly. 5. New 5 mm left lower lobe nodule. Imaging follow-up not required in a low risk patient. In a high-risk patient, optional 12 month follow-up CT could be considered to ensure stability. 6. Cystitis versus bladder nondistention. Electronically Signed   By: Telford Nab M.D.   On: 11/11/2021 05:51   US Abdomen Limited RUQ (LIVER/GB)  Result Date: 11/11/2021 CLINICAL DATA:  Elevated LFTs. EXAM: ULTRASOUND ABDOMEN LIMITED RIGHT UPPER QUADRANT COMPARISON:  Abdominal ultrasound 07/10/2021 FINDINGS: Gallbladder: Surgically absent. Common bile duct: Diameter: 7.5 mm Liver: No focal lesion identified. Within normal limits in parenchymal echogenicity. Portal vein is patent on color Doppler imaging with normal direction of blood flow towards the liver. Other: Increased echogenicity of the right kidney, partially  visualized. IMPRESSION: 1. No acute abnormality identified. 2. Increased echogenicity of the right kidney again seen suggesting medical renal disease. Electronically Signed   By: Ofilia Neas M.D.   On: 11/11/2021 08:53      IMPRESSION:   Recurrent signs of infection/sepsis in patient who was recently treated and has completed antibiotics for Klebsiella and Acinetobacter bacteremia/sepsis, presumed secondary to PICC line in left thigh.  Completed antibiotics and on 12/9 the left thigh PICC was removed and a new PICC was placed in the right thigh.     Elevated LFTs, question secondary to sepsis?  Prior cholecystectomy.  Had ERCP in 12/20, EUS/ERCP 02/2020.  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  Latest colonoscopy was 1 year ago which showed no active Crohn's and CRP was normal.  Lung nodule    PLAN:       Per Dr Havery Moros.  ? Repeat ERCP or pursue MRCP?   Azucena Freed  11/11/2021, 1:13 PM Phone [phone]

## 2021-11-11 NOTE — Progress Notes (Signed)
PHARMACY - TOTAL PARENTERAL NUTRITION CONSULT NOTE   Indication: Short bowel syndrome, chronic TPN  Patient Measurements: Height: 5' 8"  (172.7 cm) Weight: 59 kg (130 lb) IBW/kg (Calculated) : 63.9 TPN AdjBW (KG): 59 Body mass index is 19.77 kg/m.  Assessment: 47 YOF presenting with fevers, hospitalized 11/12-21 for bacteremia + PICC infection. On chronic TPN for short gut syndrome ([facility]), malnutrition, recent PICC line infection with hx of recurrent line infections.    Glucose / Insulin: CBGs 80s in ED, has not needed insulin in past admissions Electrolytes: K 3.8, Na 137 Renal: SCr 2.4 (BL~ 1.4), BUN 52 Hepatic: AST 1742, ALT 1418 (BL only mild elev), Tbili 1.4, Alkphos 588 Intake / Output; MIVF:  GI Imaging:  12/17 CT abdomen - hepatic pneumobilia, inc rectal wall thickening 12/17 RUQ [place] scheduled GI Surgeries / Procedures:   Central access: PICC TPN start date: Continued from PTA, 12/17 here  Home TPN: [facility] [phone] Cycle 1800 mL over 12 hours 85 g protein, 220 g dex 20% SMOF 60g 3x week MWF  Nutritional Goals: Goal TPN rate provides TBD  RD Assessment: Pending Last RD recs 11/15  1900-2100 Kcal 90-105 g Protein Fluid needs >/= 2L/day   Current Nutrition:  None  Plan:  Await ID/GI consult recs on TPN continuation TPN labs in AM  Bertis Ruddy, PharmD Clinical Pharmacist ED Pharmacist Phone # [phone] 11/11/2021 10:50 AM

## 2021-11-11 NOTE — Consult Note (Signed)
[facility]ious Disease       Reason for Consult:  fever   Referring Physician: Dr. Lorin Mercy  Principal Problem:   Fever Active Problems:   Crohn's disease of both small and large intestine with other complication ([name])   Malnutrition ([place])   Short gut syndrome   HTN (hypertension), benign   Chronic pain syndrome   On total parenteral nutrition (TPN)   Elevated liver enzymes   Stage 4 chronic kidney disease ([name])   [name]    amLODipine  10 mg Oral Daily   buPROPion ER  200 mg Oral Daily   carvedilol  12.5 mg Oral BID WC   cycloSPORINE  1 drop Both Eyes BID   DULoxetine  120 mg Oral Daily   enoxaparin (LOVENOX) injection  30 mg Subcutaneous Q24H   hydrALAZINE  100 mg Oral TID   methadone  5 mg Oral 5 X Daily   methocarbamol  500 mg Oral BID   pantoprazole (PROTONIX) IV  40 mg Intravenous Q24H   [START ON 11/12/2021] Teduglutide (rDNA)  1.5 mg Subcutaneous Daily    Recommendations: meropenem Monitor cultures   Assessment: Possible line infection, no other symptoms at this time.  Will monitor on antibiotics but will change ceftriaxone/metronidazole to meropenem with the hepatotoxicity of unknown etiology.  Transaminitis - it appears this has been waxing and waning since the beginning of the year.  She did start Maddock earlier this year for staring spells. ? If it could be a medication effect.  Test would be to hold the medication if possible.    Antibiotics: Ceftriaxone and metronidazole  HPI: Caitlin Peters is a 60 y.o. female with a history of short gut syndrome on chronic TPN and multiple hospitalizations of line infections came in with fever.  She has a right groin femoral line and is her last access available.  Saw Dr. Baxter Flattery on 12/6 and was doing well.  Had some subjective fevers off and on since and had her line exchanged at [facility] earlier this week.  Blood cultures sent.  She feels relatively well.  No other complaints.  Also noted to have  significant elevation of her AST and ALT to 1700 and 1500, respectively.  Previously only with mild elevation.  Alk phos noted at 588.  Recent bacteremia with Acinetobacter ursingii and Klebsiella and treated with levaquin.     Review of Systems:  Constitutional: positive for fevers and chills Integument/breast: negative for rash Musculoskeletal: negative for myalgias and arthralgias All other systems reviewed and are negative    Past Medical History:  Diagnosis Date   Acute pancreatitis 04/13/2020   Anasarca 10/2019   AVN (avascular necrosis of bone) ([name])    Cataract    Choledocholithiasis (sludge) s/p ERCP 10/2019 10/21/2020   Chronic pain syndrome    CKD (chronic kidney disease), stage III ([name])    Depression    Diverticulosis    GERD (gastroesophageal reflux disease)    HTN (hypertension)    IDA (iron deficiency anemia)    Malnutrition ([place])    Mass in chest    Osteoporosis 12/24/2014   Pancreatitis    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; 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; jejunal to transverse colon anastomosis Treated at [facility] lengthening to 165cm Dr Alene Mires, [facility]   Vitamin B12 deficiency     Social History  Tobacco Use   Smoking status: Former    Packs/day: 0.50    Years: 35.00    Pack years: 17.50    Types: Cigarettes   Smokeless tobacco: Never  Vaping Use   Vaping Use: Never used  Substance Use Topics   Alcohol use: Not Currently   Drug use: Never    Family History  Problem Relation Age of Onset   Seizures Mother    Glaucoma Mother    CAD Father    Heart disease Father    Hypertension Father    Breast cancer Sister    Multiple sclerosis Sister    Diabetes Sister    Lupus Sister    Colon cancer Other    Crohn's disease Other     Allergies  Allergen Reactions   Meperidine Hives    Other reaction(s): GI Upset Due to  Chrones    Hyoscyamine Hives and Swelling    Legs swelling Disorientation   Cefepime Other (See Comments)    Neurotoxicity occurring in setting of AKI. Ceftriaxone tolerated during same admit   Gabapentin Other (See Comments)    unknown   Lyrica [Pregabalin] Other (See Comments)    Has chronic dehydration and made it worse   Topamax [Topiramate] Other (See Comments)    Has chronic dehydration and made it worse   Zosyn [Piperacillin Sod-Tazobactam So] Other (See Comments)    Patient reports it makes her vomit, her neck stiff, and her "heart feel funny" Affected kidneys   Fentanyl Rash    Pt is allergic to fentanyl patch related to the glue (gives her a rash) Pt states she is NOT allergic to fentanyl IV medicine   Morphine And Related Rash    Physical Exam: Constitutional: in no apparent distress  Vitals:   11/11/21 1500 11/11/21 1525  BP: (!) 143/65 (!) 141/71  Pulse: 72 76  Resp:  (!) 21  Temp:    SpO2: 99% 98%   EYES: anicteric ENMT: no thrush Cardiovascular: Cor RRR Respiratory: clear; Musculoskeletal: no pedal edema noted Skin: no rash Neuro: non-focal  Lab Results  Component Value Date   WBC 2.6 (L) 11/11/2021   HGB 8.1 (L) 11/11/2021   HCT 25.2 (L) 11/11/2021   MCV 95.5 11/11/2021   PLT 146 (L) 11/11/2021    Lab Results  Component Value Date   CREATININE 2.40 (H) 11/11/2021   BUN 52 (H) 11/11/2021   NA 137 11/11/2021   K 3.8 11/11/2021   CL 106 11/11/2021   CO2 24 11/11/2021    Lab Results  Component Value Date   ALT 1,418 (H) 11/11/2021   AST 1,742 (H) 11/11/2021   ALKPHOS 588 (H) 11/11/2021     Microbiology: Recent Results (from the past 240 hour(s))  Resp Panel by RT-PCR (Flu A&B, Covid) Nasopharyngeal Swab     Status: None   Collection Time: 11/11/21  7:32 AM   Specimen: Nasopharyngeal Swab; Nasopharyngeal(NP) swabs in vial transport medium  Result Value Ref Range Status   SARS Coronavirus 2 by RT PCR NEGATIVE NEGATIVE Final    Comment:  (NOTE) SARS-CoV-2 target nucleic acids are NOT DETECTED.  The SARS-CoV-2 RNA is generally detectable in upper respiratory specimens during the acute phase of infection. The lowest concentration of SARS-CoV-2 viral copies this assay can detect is 138 copies/mL. A negative result does not preclude SARS-Cov-2 infection and should not be used as the sole basis for treatment or other patient management decisions. A negative result may occur with  improper specimen  collection/handling, submission of specimen other than nasopharyngeal swab, presence of viral mutation(s) within the areas targeted by this assay, and inadequate number of viral copies(<138 copies/mL). A negative result must be combined with clinical observations, patient history, and epidemiological information. The expected result is Negative.  Fact Sheet for Patients:  EntrepreneurPulse.com.au  Fact Sheet for Healthcare Providers:  IncredibleEmployment.be  This test is no t yet approved or cleared by the [place] FDA and  has been authorized for detection and/or diagnosis of SARS-CoV-2 by FDA under an Emergency Use Authorization (EUA). This EUA will remain  in effect (meaning this test can be used) for the duration of the COVID-19 declaration under Section 564(b)(1) of the Act, 21 U.S.C.section 360bbb-3(b)(1), unless the authorization is terminated  or revoked sooner.       Influenza A by PCR NEGATIVE NEGATIVE Final   Influenza B by PCR NEGATIVE NEGATIVE Final    Comment: (NOTE) The Xpert Xpress SARS-CoV-2/FLU/RSV plus assay is intended as an aid in the diagnosis of influenza from Nasopharyngeal swab specimens and should not be used as a sole basis for treatment. Nasal washings and aspirates are unacceptable for Xpert Xpress SARS-CoV-2/FLU/RSV testing.  Fact Sheet for Patients: EntrepreneurPulse.com.au  Fact Sheet for Healthcare  Providers: IncredibleEmployment.be  This test is not yet approved or cleared by the [place] FDA and has been authorized for detection and/or diagnosis of SARS-CoV-2 by FDA under an Emergency Use Authorization (EUA). This EUA will remain in effect (meaning this test can be used) for the duration of the COVID-19 declaration under Section 564(b)(1) of the Act, 21 U.S.C. section 360bbb-3(b)(1), unless the authorization is terminated or revoked.  Performed at [facility], [place] [address], [place], [place] [address]     Latessa Tillis W Taylon Coole, [facility] Infectious Disease [facility] [email or web address] 11/11/2021, 4:01 PM

## 2021-11-11 NOTE — Progress Notes (Signed)
Pharmacy Antibiotic Note  Caitlin Peters is a 60 y.o. female admitted on 11/11/2021 with  fever .  Pharmacy has been consulted for meropenem dosing.  Patient with a history of chronic pain; Crohn's disease s/p multiple resections with short gut syndrome and severe malnutrition on TPN through R thigh PICC line; stage 4 CKD; anxiety/depression; seizure d/o; and HTN . Patient presenting with fever and abdominal pain. Recently hospitalized 11/12-21 for Klebsiella pneumoniae septicemia and concurrent Acinetobacter infection from chronic PICC line. ID has been consulted and recommending meropenem.  SCr 2.4 - up from November but has been around 2.3 in December WBC 2.6; T 98.8 F  Plan: Meropenem 1g q12h Trend WBC, Fever, Renal function, & Clinical course F/u cultures, clinical course, WBC, fever De-escalate when able F/u ID recommendations  Height: 5' 8"  (172.7 cm) Weight: 59 kg (130 lb) IBW/kg (Calculated) : 63.9  Temp (24hrs), Avg:99.4 F (37.4 C), Min:98 F (36.7 C), Max:101.6 F (38.7 C)  Recent Labs  Lab 11/06/21 0000 11/11/21 0245  WBC 3.2 2.6*  CREATININE 2.2* 2.40*    Estimated Creatinine Clearance: 23.2 mL/min (A) (by C-G formula based on SCr of 2.4 mg/dL (H)).    Allergies  Allergen Reactions   Meperidine Hives    Other reaction(s): GI Upset Due to Chrones    Hyoscyamine Hives and Swelling    Legs swelling Disorientation   Cefepime Other (See Comments)    Neurotoxicity occurring in setting of AKI. Ceftriaxone tolerated during same admit   Gabapentin Other (See Comments)    unknown   Lyrica [Pregabalin] Other (See Comments)    Has chronic dehydration and made it worse   Topamax [Topiramate] Other (See Comments)    Has chronic dehydration and made it worse   Zosyn [Piperacillin Sod-Tazobactam So] Other (See Comments)    Patient reports it makes her vomit, her neck stiff, and her "heart feel funny" Affected kidneys   Fentanyl Rash    Pt is allergic to fentanyl  patch related to the glue (gives her a rash) Pt states she is NOT allergic to fentanyl IV medicine   Morphine And Related Rash    Antimicrobials this admission: meropenem 12/17 >>   Microbiology results: Pending  Thank you for allowing pharmacy to be a part of this patients care.  Lorelei Pont, PharmD, BCPS 11/11/2021 4:39 PM ED Clinical Pharmacist -  [phone]

## 2021-11-11 NOTE — Progress Notes (Signed)
NEW ADMISSION NOTE New Admission Note:   Arrival Method: stretcher Mental Orientation: A&OX4 Telemetry: none Assessment: Completed Skin:intact IV: PICC right thigh Pain:7/10 Tubes:none Safety Measures: Safety Fall Prevention Plan has been given, discussed and signed Admission: Completed [facility] Orientation: Patient has been orientated to the room, unit and staff.  Family: none at bedside  Orders have been reviewed and implemented. Will continue to monitor the patient. Call light has been placed within reach and bed alarm has been activated.   Hildur Bayer S Omarian Jaquith, RN

## 2021-11-11 NOTE — H&P (Signed)
History and Physical    Caitlin Peters:[id number] DOB: 10-20-1961 DOA: 11/11/2021  PCP: [name], [name], MD Consultants:  Baxter Flattery - ID; Domenica Fail - GI; Delice Lesch - neurology; Chandrasekhar - cardiology Patient coming from:  Home - lives with 3 grandsons and daughter; NOK: Daughter, Caitlin Peters, [phone]  Chief Complaint: fever  HPI: Caitlin Peters is a 60 y.o. female with medical history significant of chronic pain; Crohn's disease s/p multiple resections with short gut syndrome and severe malnutrition on TPN through R thigh PICC line; stage 4 CKD; anxiety/depression; seizure d/o; and HTN presenting with fever and abdominal pain.  She was last hospitalized from 11/12-21 with Wynelle Link pneumoniae septicemia and concurrent Acinetobacter infection from chronic PICC line.   She was treated with Levofloxacin IV plus Cipro intracatheter lock solution and kept her line in place.  She started having little fevers - she would take Tylenol and they would go away.  Last night, it really hit her.  She had urinary incontinence.  She did have abdominal, back, and leg pain - which is her usual presentation when she is infected.  Fevers started last week and they were "no big deal."  She has "bowel movements like a baby".  She has a short that she gives herself with her bowels (Gattex, teduglutide) - she has to have this and so her daughter will bring it in.  She had a PICC line catheter exchange earlier this week.    ED Course: Fever with long h/o Crohn's and CLABSI.  Line looks ok, febrile to 101.6.  Also abdominal pain, LFTs in 1000s.  CT nonspecific, [place] ordered.  Review of Systems: As per HPI; otherwise review of systems reviewed and negative.   Ambulatory Status:  Ambulates without assistance or with a walker  COVID Vaccine Status:  Complete plus booster  Past Medical History:  Diagnosis Date   Acute pancreatitis 04/13/2020   Anasarca 10/2019   AVN (avascular necrosis of bone) ([name])     Cataract    Choledocholithiasis (sludge) s/p ERCP 10/2019 10/21/2020   Chronic pain syndrome    CKD (chronic kidney disease), stage III ([name])    Depression    Diverticulosis    GERD (gastroesophageal reflux disease)    HTN (hypertension)    IDA (iron deficiency anemia)    Malnutrition ([place])    Mass in chest    Osteoporosis 12/24/2014   Pancreatitis    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; 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; jejunal to transverse colon anastomosis Treated at [facility] lengthening to 165cm Dr Alene Mires, [facility]   Vitamin B12 deficiency     Past Surgical History:  Procedure Laterality Date   ABDOMINAL ADHESION SURGERY  01/22/2018   APPENDECTOMY  1989   BILIARY DILATION  11/26/2019   Procedure: BILIARY DILATION;  Surgeon: Jackquline Denmark, MD;  Location: [facility];  Service: Endoscopy;;   BILIARY DILATION  03/08/2020   Procedure: BILIARY DILATION;  Surgeon: Irving Copas., MD;  Location: [place];  Service: Gastroenterology;;   BIOPSY  03/08/2020   Procedure: BIOPSY;  Surgeon: Irving Copas., MD;  Location: [facility] ENDOSCOPY;  Service: Gastroenterology;;   CHEST WALL RESECTION     right thoracotomy,resection of chest mass with anterior rib and reconstruction using prosthetic mesh and video arthroscopy   CHOLECYSTECTOMY  01/22/2018   COLONOSCOPY  2019   ENTEROSTOMY CLOSURE  04/1999   ERCP N/A 11/26/2019   Procedure: ENDOSCOPIC RETROGRADE CHOLANGIOPANCREATOGRAPHY (ERCP);  Surgeon: Jackquline Denmark, MD;  Location: [facility];  Service: Endoscopy;  Laterality: N/A;   ERCP N/A 03/08/2020   Procedure: ENDOSCOPIC RETROGRADE CHOLANGIOPANCREATOGRAPHY (ERCP);  Surgeon: Irving Copas., MD;  Location: [place];  Service: Gastroenterology;  Laterality: N/A;   ESOPHAGOGASTRODUODENOSCOPY N/A 03/08/2020   Procedure: ESOPHAGOGASTRODUODENOSCOPY  (EGD);  Surgeon: Irving Copas., MD;  Location: [place];  Service: Gastroenterology;  Laterality: N/A;   EUS N/A 03/08/2020   Procedure: UPPER ENDOSCOPIC ULTRASOUND (EUS) LINEAR;  Surgeon: Irving Copas., MD;  Location: [place];  Service: Gastroenterology;  Laterality: N/A;   ILEOCECETOMY  03/1999   ileocolon resection with abdominal stoma   ILEOSTOMY CLOSURE  2001   IR FLUORO GUIDE CV LINE LEFT  01/07/2020   IR FLUORO GUIDE CV LINE LEFT  03/09/2020   IR FLUORO GUIDE CV LINE LEFT  05/09/2020   IR FLUORO GUIDE CV LINE LEFT  07/20/2020   IR FLUORO GUIDE CV LINE RIGHT  08/05/2020   IR FLUORO GUIDE CV LINE RIGHT  04/03/2021   IR PTA VENOUS EXCEPT DIALYSIS CIRCUIT  01/07/2020   IR REMOVAL TUN CV CATH W/O FL  08/05/2020   IR REMOVAL TUN CV CATH W/O FL  07/11/2021   IR US GUIDE VASC ACCESS LEFT     x 2 06/17/19 and 09/14/2019   IR US GUIDE VASC ACCESS RIGHT  08/05/2020   KNEE SURGERY     right knee    LAPAROSCOPIC SMALL BOWEL RESECTION  2009   2000-2009.  SB resections for Crohns Disease - now with Short gut   OMENTECTOMY  01/22/2018   PARTIAL HYSTERECTOMY  1984   with LSO   REMOVAL OF STONES  11/26/2019   Procedure: REMOVAL OF STONES;  Surgeon: Jackquline Denmark, MD;  Location: [facility];  Service: Endoscopy;;   REMOVAL OF STONES  03/08/2020   Procedure: REMOVAL OF STONES;  Surgeon: Irving Copas., MD;  Location: [facility] ENDOSCOPY;  Service: Gastroenterology;;   SALPINGOOPHORECTOMY Left 1984   SALPINGOOPHORECTOMY Right 1990   SERIAL TRANSVERSE ENTEROPLASTY (STEP) - SMALL BOWEL LENGTHENING  01/22/2018   Dr Alene Mires, [facility] - SB length from 120 to 165cm    SMALL INTESTINE SURGERY  2002   SMALL INTESTINE SURGERY  2003   SPHINCTEROTOMY  11/26/2019   Procedure: SPHINCTEROTOMY;  Surgeon: Jackquline Denmark, MD;  Location: [facility];  Service: Endoscopy;;   TOTAL ABDOMINAL HYSTERECTOMY  1990   with RSO   UPPER GASTROINTESTINAL ENDOSCOPY      Social History    Socioeconomic History   Marital status: Single    Spouse name: Not on file   Number of children: Not on file   Years of education: Not on file   Highest education level: Not on file  Occupational History   Occupation: disabled  Tobacco Use   Smoking status: Former    Packs/day: 0.50    Years: 35.00    Pack years: 17.50    Types: Cigarettes   Smokeless tobacco: Never  Vaping Use   Vaping Use: Never used  Substance and Sexual Activity   Alcohol use: Not Currently   Drug use: Never   Sexual activity: Yes  Other Topics Concern   Not on file  Social History Narrative   Right handed   One story apartment   No caffeine   Social Determinants of Health   Financial Resource Strain: Not on file  Food Insecurity: Not  on file  Transportation Needs: Not on file  Physical Activity: Not on file  Stress: Not on file  Social Connections: Not on file  Intimate Partner Violence: Not on file    Allergies  Allergen Reactions   Meperidine Hives    Other reaction(s): GI Upset Due to [place]    Hyoscyamine Hives and Swelling    Legs swelling Disorientation   Cefepime Other (See Comments)    Neurotoxicity occurring in setting of AKI. Ceftriaxone tolerated during same admit   Gabapentin Other (See Comments)    unknown   Lyrica [Pregabalin] Other (See Comments)    Has chronic dehydration and made it worse   Topamax [Topiramate] Other (See Comments)    Has chronic dehydration and made it worse   Zosyn [Piperacillin Sod-Tazobactam So] Other (See Comments)    Patient reports it makes her vomit, her neck stiff, and her "heart feel funny" Affected kidneys   Fentanyl Rash    Pt is allergic to fentanyl patch related to the glue (gives her a rash) Pt states she is NOT allergic to fentanyl IV medicine   Morphine And Related Rash    Family History  Problem Relation Age of Onset   Seizures Mother    Glaucoma Mother    CAD Father    Heart disease Father    Hypertension Father     Breast cancer Sister    Multiple sclerosis Sister    Diabetes Sister    Lupus Sister    Colon cancer Other    Crohn's disease Other     Prior to Admission medications   Medication Sig Start Date End Date Taking? Authorizing Provider  amLODipine (NORVASC) 10 MG tablet Take 1 tablet by mouth once daily Patient taking differently: Take 10 mg by mouth daily. 08/22/21   [name], [name], MD  buPROPion ER [facility] SR) 100 MG 12 hr tablet Take 2 tablets by mouth once daily Patient taking differently: Take 200 mg by mouth daily. 08/30/21   [name], [name], MD  Calcium 500 MG tablet Take 500 mg by mouth daily.    [provider]  carvedilol (COREG) 12.5 MG tablet Take 1 tablet (12.5 mg total) by mouth 2 (two) times daily with a meal. 09/29/21   Little Ishikawa, MD  cholecalciferol (VITAMIN D3) 25 MCG (1000 UT) tablet Take 1,000 Units by mouth daily.     [provider]  cycloSPORINE (RESTASIS) 0.05 % ophthalmic emulsion Place 1 drop into both eyes 2 (two) times daily.    [provider]  DEXILANT 60 MG capsule Take 1 capsule by mouth once daily Patient taking differently: Take 60 mg by mouth daily. 08/01/21   [name], [name], MD  diphenoxylate-atropine (LOMOTIL) 2.5-0.025 MG tablet Take 1 tablet by mouth 2 (two) times daily as needed for diarrhea or loose stools. 09/04/21   [provider]  DULoxetine (CYMBALTA) 60 MG capsule Take 2 capsules (120 mg total) by mouth daily. 09/15/21   [name], [name], MD  estradiol (ESTRACE) 2 MG tablet Take 1 tablet by mouth once daily Patient taking differently: Take 2 mg by mouth daily. 10/03/21   [name], [name], MD  famotidine (PEPCID) 20 MG tablet Take 20 mg by mouth daily as needed for heartburn or indigestion.    [provider]  GATTEX 5 MG KIT Inject 1.5 mg into the skin daily. 01/03/21   [provider]  hydrALAZINE (APRESOLINE) 100 MG tablet Take 1  tablet (100 mg total) by mouth 3 (three) times daily. 11/07/21   [name], [name], MD  HYDROmorphone (DILAUDID) 4 MG tablet Take 4 mg by mouth 4 (four) times daily as needed for moderate pain or severe pain. 04/18/21   [provider]  levETIRAcetam (KEPPRA) 500 MG tablet Take 1 tablet (500 mg total) by mouth 2 (two) times daily. 08/28/21   Cameron Sprang, MD  lidocaine (LIDODERM) 5 % Place 1 patch onto the skin daily. Remove & Discard patch within 12 hours or as directed by MD 06/30/21   Eugenie Filler, MD  lipase/protease/amylase (CREON) 36000 UNITS CPEP capsule Take 1 capsule (36,000 Units total) by mouth See admin instructions. 36000 units with meals Patient taking differently: Take 36,000 Units by mouth 3 (three) times daily with meals. 06/30/21   Eugenie Filler, MD  Menthol, Topical Analgesic, (BIOFREEZE EX) Apply 1 application topically as needed (pain).    [provider]  methadone (DOLOPHINE) 5 MG tablet Take 1 tablet (5 mg total) by mouth 5 (five) times daily. 09/29/21   Little Ishikawa, MD  methocarbamol (ROBAXIN) 500 MG tablet Take 1 tablet (500 mg total) by mouth in the morning and at bedtime. 06/13/21   [name], [name], MD  Multiple Vitamins-Minerals (MULTIVITAMIN ADULT PO) Take 1 tablet by mouth daily.    [provider]  NARCAN 4 MG/0.1ML LIQD nasal spray kit Place 1 spray into the nose once as needed (overdose). Patient not taking: Reported on 10/31/2021 10/14/20   [provider]  polyvinyl alcohol (LIQUIFILM TEARS) 1.4 % ophthalmic solution Place 1 drop into both eyes as needed for dry eyes.    [provider]  PRESCRIPTION MEDICATION Inject 1 each into the vein daily. Home TPN . [facility] . 1 bag for 12 hours. [phone]    [provider]  promethazine (PHENERGAN) 25 MG tablet TAKE 1 TABLET BY MOUTH EVERY 6 HOURS AS NEEDED FOR NAUSEA FOR VOMITING 10/25/21   [name], [name], MD  sodium chloride 0.9 % infusion Inject 1,000 mLs into the vein daily. For chronic dehydration 10/16/20   [provider]  sucralfate (CARAFATE) 1 GM/10ML suspension Take 10 mLs (1 g total) by mouth 2 (two) times daily. 06/30/21   Eugenie Filler, MD  Trace Minerals Cu-Mn-Se-Zn (TRALEMENT IV) Inject 1 mL into the vein See admin instructions. Used in TPN bag 4 times weekly    [provider]  vitamin B-12 (CYANOCOBALAMIN) 1000 MCG tablet Take 1,000 mcg by mouth daily.     [provider]    Physical Exam: Vitals:   11/11/21 0981 11/11/21 0645 11/11/21 0700 11/11/21 0730  BP:  129/66 139/63 (!) 150/67  Pulse: 67 70 68 76  Resp:    15  Temp:      TempSrc:      SpO2: 98% 98% 98% 96%  Weight:      Height:         General:  Appears calm and comfortable and is in NAD Eyes:  PERRL, EOMI, normal lids, iris ENT:  grossly normal hearing, lips & tongue, mmm Neck:  no LAD, masses or thyromegaly Cardiovascular:  RRR, no m/r/g. No LE edema.  Respiratory:   CTA bilaterally with no wheezes/rales/rhonchi.  Normal respiratory effort. Abdomen:  soft, NT, ND, NABS, multiple surgical scars Skin:  no rash or induration seen on limited exam Musculoskeletal:  grossly normal tone BUE/BLE, good ROM, no  bony abnormality; PICC line in R upper thigh Lower extremity:  No LE edema.  Limited foot exam with no ulcerations.  2+ distal pulses. Psychiatric:  grossly normal mood and affect, speech fluent and appropriate, AOx3 Neurologic:  CN 2-12 grossly intact, moves all extremities in coordinated fashion    Radiological Exams on Admission: Independently reviewed - see discussion in A/P where applicable  CT ABDOMEN PELVIS WO CONTRAST  Result Date: 11/11/2021 CLINICAL DATA:  Suspected biliary obstruction. Fever and generalized body aches. Abdominal pain. History of Crohn's disease, previous colitis. EXAM: CT ABDOMEN AND PELVIS WITHOUT CONTRAST TECHNIQUE:  Multidetector CT imaging of the abdomen and pelvis was performed following the standard protocol without IV contrast. COMPARISON:  There are numerous prior CTs, but the 3 most recent are 10/08/2021, 09/25/2021 and 07/26/2021 FINDINGS: Factors affecting image quality: Very little body fat limits subject contrast, and lack of IV and enteric contrast also limits diagnostic quality. Lower chest: No infiltrate is seen. There are scattered linear scar like opacities. Mild cardiomegaly without pericardial effusion. A 5 mm noncalcified nodule has developed in the posterior basal left lower lobe on series 5 axial 30. Hepatobiliary: Right femoral approach infusion catheter is again noted terminating in the left hepatic vein at the junction with the IVC. Left hepatic pneumobilia is present and not seen previously. The gallbladder is absent. Mild intrahepatic and extrahepatic biliary dilatation is again shown and seems unchanged. Liver parenchyma is grossly unremarkable as far seen. Pancreas: Mild ductal dilatation is again shown without adjacent fat stranding. No focal masses seen without contrast. Spleen: No focal noncontrast abnormality or enlargement. Adrenals/Urinary Tract: There is no adrenal mass. No abnormality of unenhanced renal cortex. There are no stones or hydronephrosis. There is mild bladder thickening versus nondistention. Stomach/Bowel: The gastric wall is chronically thickened proximally but no more than previously. There are multifocal intestinal surgical changes including a prior right hemicolectomy, with matted and clumped unopacified small bowel limiting evaluation of the bowel but with again noted dilatation of lower abdominal small bowel up to 3.8 cm, suggestion of at least mild wall thickening of some of the clumped segments, with moderate stool retention in the descending and sigmoid colon but no pericolic inflammatory stranding. There is possible increased wall thickening of the rectum but no  increased stranding. Vascular/Lymphatic: Aortic atherosclerosis. No enlarged abdominal or pelvic lymph nodes. Reproductive: The uterus is absent.  No adnexal mass is seen. Other: No abdominal wall hernia or abnormality. No abdominopelvic ascites. There is a general paucity of body fat, mild body wall anasarca. Musculoskeletal: No acute or significant osseous findings. IMPRESSION: 1. Mild intrahepatic and extrahepatic biliary prominence is again noted and has been present on multiple prior studies. The degree of biliary dilatation is no greater than previously but there is a small volume of left hepatic pneumobilia not seen on the last study but was seen on 09/25/2021. This could be due to prior sphincterotomy or infectious process. 2. Multifocal intestinal postsurgical changes including right hemicolectomy, with chronic dilatation of the lower abdominal small bowel with fluid levels. This is also unchanged but there may be increased rectal wall thickening on today's exam. 3. General lack of body fat with body wall anasarca, which could suggest cachexia with malnutrition. 4. Cardiomegaly. 5. New 5 mm left lower lobe nodule. Imaging follow-up not required in a low risk patient. In a high-risk patient, optional 12 month follow-up CT could be considered to ensure stability. 6. Cystitis versus bladder nondistention. Electronically Signed   By: Lanny Hurst  Chesser M.D.   On: 11/11/2021 05:51    EKG: not done   Labs on Admission: I have personally reviewed the available labs and imaging studies at the time of the admission.  Pertinent labs:   BUN 52/Creatinine 2.40/GFR 23 - stable AP 588 Albumin 2.7 AST 1742/AST 1418/Bili 1.4 - previously in 200s WBC 2.6 - stable Hgb 8.1 - stable INR 1.0 HCG <5 UA: 100 protein   Assessment/Plan Principal Problem:   Fever Active Problems:   Crohn's disease of both small and large intestine with other complication ([name])   Malnutrition ([name])   Short gut syndrome   HTN  (hypertension), benign   Chronic pain syndrome   On total parenteral nutrition (TPN)   Elevated liver enzymes   Stage 4 chronic kidney disease ([name])   [name]   Fever -Patient with h/o recurrent CLABSI 2/2 chronic PICC line use for TPN -Most recent infection was Klebsiella pneumoniae and Acinteobacter in November and also with Acinetobacter in July -She also had mild LFT elevation at that time, but has marked elevation currently -Imaging today with mild intra/extra hepatic biliary prominence and small volume left hepatic pneumobilia as well as multifocal intestinal postsurgical changes and possible increased rectal wall thickening -She had PICC line exchange earlier this week, which could have led to bacteremia -She does not have SIRS criteria other than fever, low current suspicion for sepsis -Will admit  -ID consult -Started on Rocephin/Flagyl in the ER; will continue pending ID input and blood cultures  Elevated LFTs -Mild elevation during prior admission, thought to be related to disseminated infection -Now with significant elevation -RUQ [place] pending -She has a h/o choledocholithiasis s/p ERCP in 10/2019 -GI consulted - she is followed at [facility] and so does not have an established gastroenterologist here -Will trend -TPN also may be contributing, as parenteral nutrition-associated liver disease is a known complication  Crohn's disease -Multiple prior resections, resulting in short gut syndrome -Imaging with ?rectal thickening; it is possible that current presentation is associated with Crohn's flare, although she does not have significant new GI symptoms -Will change Dexilant to IV Protonix for now -May continue Gattex (will need home supply) -NPO for now so does not appear to need Carafate -Nausea control with Zofran; PR Phenergan as needed for breakthrough n/v  HTN -Continue Norvasc, Coreg, hydralazine  Seizure d/o -Continue Keppra - will change to IV to ensure  adequate dosing for now  Stage 4 CKD -Appears to be stable at this time -Will trend  Pancreatic insufficiency -Will need to resume Creon when PO intake is resumed  Chronic pain -I have reviewed this patient in the [place] Controlled Substances Reporting System.  She is receiving medications from only one provider and appears to be taking them as prescribed. -She is at particularly high risk of opioid misuse, diversion, or overdose.  -Will continue home Dilaudid and Methadone - there does not appear to be a current indication for parenteral narcotics -Continue Robaxin  Malnutrition, on TPN -Pharmacy consult for TPN -May need to hold based on liver injury, but will await GI input  Mood d/o -Continue Wellbutrin, Cymbalta     Note: This patient has been tested and is negative for the novel coronavirus COVID-19. The patient has been fully vaccinated against COVID-19.   Level of care: Med-Surg DVT prophylaxis:  Lovenox  Code Status:  Full - confirmed with patient Family Communication: None present Disposition Plan:  The patient is from: home  Anticipated d/c is to: home without  [place] services   Anticipated d/c date will depend on clinical response to treatment, likely several days  Patient is currently: acutely ill Consults called: ID; GI  Admission status:  Admit - It is my clinical opinion that admission to INPATIENT is reasonable and necessary because of the expectation that this patient will require hospital care that crosses at least 2 midnights to treat this condition based on the medical complexity of the problems presented.  Given the aforementioned information, the predictability of an adverse outcome is felt to be significant.    Karmen Bongo MD [facility]   How to contact the [facility] Attending or Consulting provider [place] or covering provider during after hours [place], for this patient?  Check the care team in [facility] and look for a) attending/consulting [facility] provider  listed and b) the [facility] team listed Log into [email or web address] and use [place]'s universal password to access. If you do not have the password, please contact the hospital operator. Locate the [facility] provider you are looking for under [facility] and page to a number that you can be directly reached. If you still have difficulty reaching the provider, please page the [facility] (Director on Call) for the Hospitalists listed on amion for assistance.   11/11/2021, 8:55 AM

## 2021-11-12 LAB — BLOOD CULTURE ID PANEL (REFLEXED) - BCID2
A.calcoaceticus-baumannii: NOT DETECTED
Bacteroides fragilis: NOT DETECTED
Candida albicans: NOT DETECTED
Candida auris: NOT DETECTED
Candida glabrata: NOT DETECTED
Candida krusei: NOT DETECTED
Candida parapsilosis: NOT DETECTED
Candida tropicalis: NOT DETECTED
Cryptococcus neoformans/gattii: NOT DETECTED
Enterobacter cloacae complex: NOT DETECTED
Enterobacterales: NOT DETECTED
Enterococcus Faecium: NOT DETECTED
Enterococcus faecalis: NOT DETECTED
Escherichia coli: NOT DETECTED
Haemophilus influenzae: NOT DETECTED
Klebsiella aerogenes: NOT DETECTED
Klebsiella oxytoca: NOT DETECTED
Klebsiella pneumoniae: NOT DETECTED
Listeria monocytogenes: NOT DETECTED
Neisseria meningitidis: NOT DETECTED
Proteus species: NOT DETECTED
Pseudomonas aeruginosa: NOT DETECTED
Salmonella species: NOT DETECTED
Serratia marcescens: NOT DETECTED
Staphylococcus aureus (BCID): NOT DETECTED
Staphylococcus epidermidis: NOT DETECTED
Staphylococcus lugdunensis: NOT DETECTED
Staphylococcus species: NOT DETECTED
Stenotrophomonas maltophilia: DETECTED — AB
Streptococcus agalactiae: NOT DETECTED
Streptococcus pneumoniae: NOT DETECTED
Streptococcus pyogenes: NOT DETECTED
Streptococcus species: NOT DETECTED

## 2021-11-12 LAB — COMPREHENSIVE METABOLIC PANEL
ALT: 935 U/L — ABNORMAL HIGH (ref 0–44)
AST: 579 U/L — ABNORMAL HIGH (ref 15–41)
Albumin: 2.2 g/dL — ABNORMAL LOW (ref 3.5–5.0)
Alkaline Phosphatase: 468 U/L — ABNORMAL HIGH (ref 38–126)
Anion gap: 6 (ref 5–15)
BUN: 42 mg/dL — ABNORMAL HIGH (ref 6–20)
CO2: 23 mmol/L (ref 22–32)
Calcium: 8.8 mg/dL — ABNORMAL LOW (ref 8.9–10.3)
Chloride: 108 mmol/L (ref 98–111)
Creatinine, Ser: 2.05 mg/dL — ABNORMAL HIGH (ref 0.44–1.00)
GFR, Estimated: 27 mL/min — ABNORMAL LOW (ref >60)
Glucose, Bld: 75 mg/dL (ref 70–99)
Potassium: 4.2 mmol/L (ref 3.5–5.1)
Sodium: 137 mmol/L (ref 135–145)
Total Bilirubin: 2.1 mg/dL — ABNORMAL HIGH (ref 0.3–1.2)
Total Protein: 5.5 g/dL — ABNORMAL LOW (ref 6.5–8.1)

## 2021-11-12 LAB — CBC
HCT: 22.2 % — ABNORMAL LOW (ref 36.0–46.0)
Hemoglobin: 7.1 g/dL — ABNORMAL LOW (ref 12.0–15.0)
MCH: 30.7 pg (ref 26.0–34.0)
MCHC: 32 g/dL (ref 30.0–36.0)
MCV: 96.1 fL (ref 80.0–100.0)
Platelets: 117 10*3/uL — ABNORMAL LOW (ref 150–400)
RBC: 2.31 MIL/uL — ABNORMAL LOW (ref 3.87–5.11)
RDW: 13.5 % (ref 11.5–15.5)
WBC: 7.3 10*3/uL (ref 4.0–10.5)
nRBC: 0 % (ref 0.0–0.2)

## 2021-11-12 LAB — TRIGLYCERIDES: Triglycerides: 160 mg/dL — ABNORMAL HIGH (ref <150)

## 2021-11-12 LAB — PHOSPHORUS: Phosphorus: 3.5 mg/dL (ref 2.5–4.6)

## 2021-11-12 LAB — CK: Total CK: 28 U/L — ABNORMAL LOW (ref 38–234)

## 2021-11-12 LAB — MAGNESIUM: Magnesium: 2 mg/dL (ref 1.7–2.4)

## 2021-11-12 LAB — HIV ANTIBODY (ROUTINE TESTING W REFLEX): HIV Screen 4th Generation wRfx: NONREACTIVE

## 2021-11-12 MED ORDER — ENOXAPARIN SODIUM 30 MG/0.3ML IJ SOSY: 30.0000 mg | PREFILLED_SYRINGE | INTRAMUSCULAR | Status: DC

## 2021-11-12 MED ORDER — SODIUM CHLORIDE 0.9% FLUSH: 10.0000 mL | INTRAVENOUS | Status: DC | PRN

## 2021-11-12 MED ORDER — DEXTROSE 5 % IV SOLN
160.0000 mg | Freq: Three times a day (TID) | INTRAVENOUS | Status: DC
  Administered 2021-11-12 – 2021-11-13 (×5): 160 mg via INTRAVENOUS
  Filled 2021-11-12 (×6): qty 10

## 2021-11-12 MED ORDER — TEDUGLUTIDE (RDNA) 5 MG ~~LOC~~ KIT
1.5000 mg | PACK | Freq: Every day | SUBCUTANEOUS | Status: DC
  Administered 2021-11-12 – 2021-11-16 (×5): 1.5 mg via SUBCUTANEOUS
  Filled 2021-11-12 (×6): qty 1

## 2021-11-12 NOTE — Progress Notes (Signed)
PROGRESS NOTE    Caitlin Peters  WCB:[id number] DOB: 02-25-1961 DOA: 11/11/2021 PCP: [name], [name], MD   Chief Complaint  Patient presents with   Abdominal Pain  Brief Narrative/Hospital Course: [name], 60 y.o. female with PMH of chronic pain; Crohn's disease s/p multiple resections with short gut syndrome and severe malnutrition on TPN through R thigh PICC line; stage 4 CKD; anxiety/depression; seizure d/o; and HTN, multiple hospitalizations of line infection recent hospitalization 11/12-21, currently with end-stage IV access presented with fever abdominal pain. She saw Dr. Graylon Good on 12/6 and was doing well, had her line exchanged at [facility] earlier this week . In the ED febrile one 101.6, abnormal LFTs GI ID was consulted CT was nonspecific, patient was admitted for further management  Subjective: Seen and examined this morning.  Patient afebrile, reports she feels much better this morning.Creatinine downtrending LFTs also downtrending CK normal Hb down trended to 7.1 g  Assessment & Plan:  Fever with possible line infection Stenotrophomonas maltophilia sepsis Previously recurrent line infections End stage iv access: Blood cultures-BC ID with Stenotrophomonas maltophilia- culture sent on admission ID following continue with meropenem.  Met sepsis criteria with leukopenia, fever and source of infection. Complicated history with difficult IV access currently needing TPN with IV access.  Acute liver dysfunction/transaminitis: Significantly elevated LFTs-overnight downtrending nicely, T bili up to 2.1 INR normal 1.2.GI following closely, CT abdomen, CT abdomen pelvis without mild intrahepatic and extrahepatic biliary prominence again noted, as prior small volume left hepatic pneumobilia not seen on the last study, multifocal intestinal postsurgical changesUQ ultrasound-no acute finding, increased echogenicity of the right kidney.History of choledocholithiasis needing  ERCP in 10/2021.  TPN currently on hold as it may be contributing. Ultimately she may need a liver biopsy, will defer to GI. Recent Labs  Lab 11/06/21 0000 11/11/21 0245 11/11/21 0356 11/12/21 0542  AST 86* 1,742*  --  579*  ALT 229* 1,418*  --  935*  ALKPHOS 274* 588*  --  468*  BILITOT  --  1.4*  --  2.1*  PROT  --  6.2*  --  5.5*  ALBUMIN 3.4* 2.7*  --  2.2*  INR  --   --  1.0  --     Crohn's disease with multiple prior resection, short gut syndrome: Currently TPN dependent for nutrition.  Continue IV PPI, home meds, symptomatic treatment IV fluid hydration, continue plan as per GI  Anemia of chronic disease, baseline hemoglobin MID 7-MID 8S.  6.5 g recently in 07/11/2021 May need transfusion type and screen repeat H&H in a.m. Recent Labs  Lab 11/06/21 0000 11/11/21 0245 11/12/21 0542  HGB 8.1* 8.1* 7.1*  HCT 24* 25.2* 22.2*    Hypertension on Norvasc Coreg hydralazine Seizure disorder continue Keppra Stage IV CKD: Overall stable monitor Recent Labs  Lab 11/06/21 0000 11/11/21 0245 11/12/21 0542  BUN 53* 52* 42*  CREATININE 2.2* 2.40* 2.05*    Pancreatic insufficiency resume Creon as able Chronic pain [place] controlled substance reporting system reviewed on admission, high risk of opioid misuse, diversion or overdose continue home Dilaudid and methadone  Severe malnutrition on TPN pharmacy consulted  Mood disorder on Wellbutrin and Cymbalta  DVT prophylaxis: enoxaparin (LOVENOX) injection 30 mg Start: 11/11/21 1000 Code Status:   Code Status: Full Code Family Communication: plan of care discussed with patient at bedside. Status is: Inpatient Remains inpatient appropriate because: Ongoing management of liver dysfunction sepsis Disposition: Currently not medically stable for discharge. Anticipated Disposition: hh  Objective: Vitals last 24 hrs: Vitals:   11/11/21 1600 11/11/21 1726 11/11/21 2112 11/12/21 0603  BP: (!) 149/65 135/64 124/66 132/66  Pulse: 72 79 72  64  Resp: _0 Temp: 99.1 F (37.3 C) 99.5 F (37.5 C) 98.7 F (37.1 C) 97.8 F (36.6 C)  TempSrc: Oral Oral Oral Oral  SpO2: 99% 96% 98% 99%  Weight:  60 kg    Height:  _1  (1.727 m)     Weight change: 1.033 kg  Intake/Output Summary (Last 24 hours) at 11/12/2021 1610 Last data filed at 11/12/2021 0700 Gross per 24 hour  Intake 1660.07 ml  Output 0 ml  Net 1660.07 ml   Net IO Since Admission: 2,760.07 mL [11/12/21 0917]   Physical Examination: General exam: Aa0x3, thin, frail,older than stated age. HEENT:Oral mucosa moist, Ear/Nose WNL grossly,dentition normal. Respiratory system: B/l clear BS, no use of accessory muscle, non tender. Cardiovascular system: S1 & S2 +,No JVD. Gastrointestinal system: Abdomen soft, NT,ND, BS+. Nervous System:Alert, awake, moving extremities. Extremities: edema none, distal peripheral pulses palpable.  Skin: No rashes, no icterus. MSK: Normal muscle bulk, tone, power. Right thigh with IV access in place dry clean, dressing in place, no drainage erythema erythema  Medications reviewed:  Scheduled Meds:  amLODipine  10 mg Oral Daily   buPROPion ER  200 mg Oral Daily   carvedilol  12.5 mg Oral BID WC   Chlorhexidine Gluconate Cloth  6 each Topical Daily   cycloSPORINE  1 drop Both Eyes BID   DULoxetine  120 mg Oral Daily   enoxaparin (LOVENOX) injection  30 mg Subcutaneous Q24H   hydrALAZINE  100 mg Oral TID   methadone  5 mg Oral 5 X Daily   methocarbamol  500 mg Oral BID   pantoprazole (PROTONIX) IV  40 mg Intravenous Q24H   Teduglutide (rDNA)  1.5 mg Subcutaneous Daily   Continuous Infusions:  sodium chloride 75 mL/hr at 11/11/21 0927   levETIRAcetam Stopped (11/11/21 2307)   sulfamethoxazole-trimethoprim Stopped (11/12/21 0420)   Diet Order             Diet NPO time specified  Diet effective now                          Weight change: 1.033 kg  Wt Readings from Last 3 Encounters:  11/11/21 60 kg   10/31/21 60.5 kg  10/15/21 57.4 kg     Consultants:see note  Procedures:see note Antimicrobials: Anti-infectives (From admission, onward)    Start     Dose/Rate Route Frequency Ordered Stop   11/12/21 0200  sulfamethoxazole-trimethoprim (BACTRIM) 160 mg in dextrose 5 % 250 mL IVPB        160 mg 260 mL/hr over 60 Minutes Intravenous Every 8 hours 11/12/21 0109     11/11/21 2200  meropenem (MERREM) 1 g in sodium chloride 0.9 % 100 mL IVPB  Status:  Discontinued        1 g 200 mL/hr over 30 Minutes Intravenous Every 12 hours 11/11/21 1639 11/12/21 0107   11/11/21 0800  cefTRIAXone (ROCEPHIN) 2 g in sodium chloride 0.9 % 100 mL IVPB  Status:  Discontinued        2 g 200 mL/hr over 30 Minutes Intravenous Every 24 hours 11/11/21 0701 11/11/21 1621   11/11/21 0800  metroNIDAZOLE (FLAGYL) IVPB 500 mg  Status:  Discontinued        500 mg 100 mL/hr over  60 Minutes Intravenous Every 12 hours 11/11/21 0701 11/11/21 1621      Culture/Microbiology    Component Value Date/Time   SDES BLOOD FEMORAL ARTERY 11/11/2021 0635   SPECREQUEST  11/11/2021 0635    BOTTLES DRAWN AEROBIC AND ANAEROBIC Blood Culture adequate volume   CULT PENDING 11/11/2021 0635   REPTSTATUS PENDING 11/11/2021 2542    Other culture-see note  Unresulted Labs (From admission, onward)     Start     Ordered   11/13/21 0500  Comprehensive metabolic panel  (TPN Lab Panel)  Every Mon,Thu (0500),   R      11/11/21 1053   11/13/21 0500  Magnesium  (TPN Lab Panel)  Every Mon,Thu (0500),   R      11/11/21 1053   11/13/21 0500  Phosphorus  (TPN Lab Panel)  Every Mon,Thu (0500),   R      11/11/21 1053   11/13/21 0500  Triglycerides  (TPN Lab Panel)  Every Monday (0500),   R      11/11/21 1053   11/13/21 0500  Comprehensive metabolic panel  Daily,   R     Question:  Specimen collection method  Answer:  IV Team=IV Team collect   11/12/21 0759   11/13/21 0500  CBC  Daily,   R     Question:  Specimen collection method   Answer:  IV Team=IV Team collect   11/12/21 0759   11/12/21 0759  Type and screen [place]  Once,   R       Comments: [place]    11/12/21 0759   11/11/21 1805  ANA w/Reflex if Positive  Once,   R        11/11/21 1805   11/11/21 1805  IgG  Once,   R        11/11/21 1805   11/11/21 1805  Anti-smooth muscle antibody, IgG  Once,   R        11/11/21 1805   11/11/21 0815  HIV Antibody (routine testing w rflx)  (HIV Antibody (Routine testing w reflex) panel)  Once,   R        11/11/21 0817   11/11/21 0556  Blood culture (routine x 2)  BLOOD CULTURE X 2,   STAT      11/11/21 0557          Data Reviewed: I have personally reviewed following labs and imaging studies CBC: Recent Labs  Lab 11/06/21 0000 11/11/21 0245 11/12/21 0542  WBC 3.2 2.6* 7.3  NEUTROABS 1,792.00  --   --   HGB 8.1* 8.1* 7.1*  HCT 24* 25.2* 22.2*  MCV  --  95.5 96.1  PLT 175 146* 706*   Basic Metabolic Panel: Recent Labs  Lab 11/06/21 0000 11/11/21 0245 11/12/21 0542  NA 140 137 137  K 4.9 3.8 4.2  CL 105 106 108  CO2 26* 24 23  GLUCOSE  --  85 75  BUN 53* 52* 42*  CREATININE 2.2* 2.40* 2.05*  CALCIUM 8.9 8.9 8.8*  MG  --   --  2.0  PHOS  --   --  3.5   GFR: Estimated Creatinine Clearance: 27.6 mL/min (A) (by C-G formula based on SCr of 2.05 mg/dL (H)). Liver Function Tests: Recent Labs  Lab 11/06/21 0000 11/11/21 0245 11/12/21 0542  AST 86* 1,742* 579*  ALT 229* 1,418* 935*  ALKPHOS 274* 588* 468*  BILITOT  --  1.4* 2.1*  PROT  --  6.2* 5.5*  ALBUMIN 3.4* 2.7* 2.2*   Recent Labs  Lab 11/11/21 0245  LIPASE 25   No results for input(s): AMMONIA in the last 168 hours. Coagulation Profile: Recent Labs  Lab 11/11/21 0356  INR 1.0   Cardiac Enzymes: Recent Labs  Lab 11/12/21 0542  CKTOTAL 28*   BNP (last 3 results) No results for input(s): PROBNP in the last 8760 hours. HbA1C: No results for input(s): HGBA1C in the last 72  hours. CBG: No results for input(s): GLUCAP in the last 168 hours. Lipid Profile: Recent Labs    11/12/21 0542  TRIG 160*   Thyroid Function Tests: No results for input(s): TSH, T4TOTAL, FREET4, T3FREE, THYROIDAB in the last 72 hours. Anemia Panel: No results for input(s): VITAMINB12, FOLATE, FERRITIN, TIBC, IRON, RETICCTPCT in the last 72 hours. Sepsis Labs: No results for input(s): PROCALCITON, LATICACIDVEN in the last 168 hours.  Recent Results (from the past 240 hour(s))  Blood culture (routine x 2)     Status: None (Preliminary result)   Collection Time: 11/11/21  6:35 AM   Specimen: BLOOD  Result Value Ref Range Status   Specimen Description BLOOD FEMORAL ARTERY  Final   Special Requests   Final    BOTTLES DRAWN AEROBIC AND ANAEROBIC Blood Culture adequate volume   Culture  Setup Time   Final    GRAM NEGATIVE RODS AEROBIC BOTTLE ONLY CRITICAL RESULT CALLED TO, READ BACK BY AND VERIFIED WITH: PHARMD GREG ABBOTT 11/12/21_0 :4 BY TW Performed at [facility], [place] [address] [place], [place] [address]    Culture PENDING  Incomplete   Report Status PENDING  Incomplete  Blood Culture ID Panel (Reflexed)     Status: Abnormal   Collection Time: 11/11/21  6:35 AM  Result Value Ref Range Status   Enterococcus faecalis NOT DETECTED NOT DETECTED Final   Enterococcus Faecium NOT DETECTED NOT DETECTED Final   Listeria monocytogenes NOT DETECTED NOT DETECTED Final   Staphylococcus species NOT DETECTED NOT DETECTED Final   Staphylococcus aureus (BCID) NOT DETECTED NOT DETECTED Final   Staphylococcus epidermidis NOT DETECTED NOT DETECTED Final   Staphylococcus lugdunensis NOT DETECTED NOT DETECTED Final   Streptococcus species NOT DETECTED NOT DETECTED Final   Streptococcus agalactiae NOT DETECTED NOT DETECTED Final   Streptococcus pneumoniae NOT DETECTED NOT DETECTED Final   Streptococcus pyogenes NOT DETECTED NOT DETECTED Final   A.calcoaceticus-baumannii NOT DETECTED  NOT DETECTED Final   Bacteroides fragilis NOT DETECTED NOT DETECTED Final   Enterobacterales NOT DETECTED NOT DETECTED Final   Enterobacter cloacae complex NOT DETECTED NOT DETECTED Final   Escherichia coli NOT DETECTED NOT DETECTED Final   Klebsiella aerogenes NOT DETECTED NOT DETECTED Final   Klebsiella oxytoca NOT DETECTED NOT DETECTED Final   Klebsiella pneumoniae NOT DETECTED NOT DETECTED Final   Proteus species NOT DETECTED NOT DETECTED Final   Salmonella species NOT DETECTED NOT DETECTED Final   Serratia marcescens NOT DETECTED NOT DETECTED Final   Haemophilus influenzae NOT DETECTED NOT DETECTED Final   Neisseria meningitidis NOT DETECTED NOT DETECTED Final   Pseudomonas aeruginosa NOT DETECTED NOT DETECTED Final   Stenotrophomonas maltophilia DETECTED (A) NOT DETECTED Final    Comment: CRITICAL RESULT CALLED TO, READ BACK BY AND VERIFIED WITH: PHARMD GREG ABBOTT 11/12/21_1 :58 BY TW    Candida albicans NOT DETECTED NOT DETECTED Final   Candida auris NOT DETECTED NOT DETECTED Final   Candida glabrata NOT DETECTED NOT DETECTED Final   Candida krusei NOT DETECTED NOT DETECTED  Final   Candida parapsilosis NOT DETECTED NOT DETECTED Final   Candida tropicalis NOT DETECTED NOT DETECTED Final   Cryptococcus neoformans/gattii NOT DETECTED NOT DETECTED Final    Comment: Performed at [facility], [address] [place], [place] [address]  Resp Panel by RT-PCR (Flu A&B, Covid) Nasopharyngeal Swab     Status: None   Collection Time: 11/11/21  7:32 AM   Specimen: Nasopharyngeal Swab; Nasopharyngeal(NP) swabs in vial transport medium  Result Value Ref Range Status   SARS Coronavirus 2 by RT PCR NEGATIVE NEGATIVE Final    Comment: (NOTE) SARS-CoV-2 target nucleic acids are NOT DETECTED.  The SARS-CoV-2 RNA is generally detectable in upper respiratory specimens during the acute phase of infection. The lowest concentration of SARS-CoV-2 viral copies this assay can detect is 138  copies/mL. A negative result does not preclude SARS-Cov-2 infection and should not be used as the sole basis for treatment or other patient management decisions. A negative result may occur with  improper specimen collection/handling, submission of specimen other than nasopharyngeal swab, presence of viral mutation(s) within the areas targeted by this assay, and inadequate number of viral copies(<138 copies/mL). A negative result must be combined with clinical observations, patient history, and epidemiological information. The expected result is Negative.  Fact Sheet for Patients:  EntrepreneurPulse.com.au  Fact Sheet for Healthcare Providers:  IncredibleEmployment.be  This test is no t yet approved or cleared by the [place] FDA and  has been authorized for detection and/or diagnosis of SARS-CoV-2 by FDA under an Emergency Use Authorization (EUA). This EUA will remain  in effect (meaning this test can be used) for the duration of the COVID-19 declaration under Section 564(b)(1) of the Act, 21 U.S.C.section 360bbb-3(b)(1), unless the authorization is terminated  or revoked sooner.       Influenza A by PCR NEGATIVE NEGATIVE Final   Influenza B by PCR NEGATIVE NEGATIVE Final    Comment: (NOTE) The Xpert Xpress SARS-CoV-2/FLU/RSV plus assay is intended as an aid in the diagnosis of influenza from Nasopharyngeal swab specimens and should not be used as a sole basis for treatment. Nasal washings and aspirates are unacceptable for Xpert Xpress SARS-CoV-2/FLU/RSV testing.  Fact Sheet for Patients: EntrepreneurPulse.com.au  Fact Sheet for Healthcare Providers: IncredibleEmployment.be  This test is not yet approved or cleared by the [place] FDA and has been authorized for detection and/or diagnosis of SARS-CoV-2 by FDA under an Emergency Use Authorization (EUA). This EUA will remain in effect (meaning  this test can be used) for the duration of the COVID-19 declaration under Section 564(b)(1) of the Act, 21 U.S.C. section 360bbb-3(b)(1), unless the authorization is terminated or revoked.  Performed at [facility], [place] [address], [place], [place] [address]      Radiology Studies: CT ABDOMEN PELVIS WO CONTRAST  Result Date: 11/11/2021 CLINICAL DATA:  Suspected biliary obstruction. Fever and generalized body aches. Abdominal pain. History of Crohn's disease, previous colitis. EXAM: CT ABDOMEN AND PELVIS WITHOUT CONTRAST TECHNIQUE: Multidetector CT imaging of the abdomen and pelvis was performed following the standard protocol without IV contrast. COMPARISON:  There are numerous prior CTs, but the 3 most recent are 10/08/2021, 09/25/2021 and 07/26/2021 FINDINGS: Factors affecting image quality: Very little body fat limits subject contrast, and lack of IV and enteric contrast also limits diagnostic quality. Lower chest: No infiltrate is seen. There are scattered linear scar like opacities. Mild cardiomegaly without pericardial effusion. A 5 mm noncalcified nodule has developed in the posterior basal  left lower lobe on series 5 axial 30. Hepatobiliary: Right femoral approach infusion catheter is again noted terminating in the left hepatic vein at the junction with the IVC. Left hepatic pneumobilia is present and not seen previously. The gallbladder is absent. Mild intrahepatic and extrahepatic biliary dilatation is again shown and seems unchanged. Liver parenchyma is grossly unremarkable as far seen. Pancreas: Mild ductal dilatation is again shown without adjacent fat stranding. No focal masses seen without contrast. Spleen: No focal noncontrast abnormality or enlargement. Adrenals/Urinary Tract: There is no adrenal mass. No abnormality of unenhanced renal cortex. There are no stones or hydronephrosis. There is mild bladder thickening versus nondistention. Stomach/Bowel: The gastric wall is  chronically thickened proximally but no more than previously. There are multifocal intestinal surgical changes including a prior right hemicolectomy, with matted and clumped unopacified small bowel limiting evaluation of the bowel but with again noted dilatation of lower abdominal small bowel up to 3.8 cm, suggestion of at least mild wall thickening of some of the clumped segments, with moderate stool retention in the descending and sigmoid colon but no pericolic inflammatory stranding. There is possible increased wall thickening of the rectum but no increased stranding. Vascular/Lymphatic: Aortic atherosclerosis. No enlarged abdominal or pelvic lymph nodes. Reproductive: The uterus is absent.  No adnexal mass is seen. Other: No abdominal wall hernia or abnormality. No abdominopelvic ascites. There is a general paucity of body fat, mild body wall anasarca. Musculoskeletal: No acute or significant osseous findings. IMPRESSION: 1. Mild intrahepatic and extrahepatic biliary prominence is again noted and has been present on multiple prior studies. The degree of biliary dilatation is no greater than previously but there is a small volume of left hepatic pneumobilia not seen on the last study but was seen on 09/25/2021. This could be due to prior sphincterotomy or infectious process. 2. Multifocal intestinal postsurgical changes including right hemicolectomy, with chronic dilatation of the lower abdominal small bowel with fluid levels. This is also unchanged but there may be increased rectal wall thickening on today's exam. 3. General lack of body fat with body wall anasarca, which could suggest cachexia with malnutrition. 4. Cardiomegaly. 5. New 5 mm left lower lobe nodule. Imaging follow-up not required in a low risk patient. In a high-risk patient, optional 12 month follow-up CT could be considered to ensure stability. 6. Cystitis versus bladder nondistention. Electronically Signed   By: Telford Nab M.D.   On:  11/11/2021 05:51   US Abdomen Limited RUQ (LIVER/GB)  Result Date: 11/11/2021 CLINICAL DATA:  Elevated LFTs. EXAM: ULTRASOUND ABDOMEN LIMITED RIGHT UPPER QUADRANT COMPARISON:  Abdominal ultrasound 07/10/2021 FINDINGS: Gallbladder: Surgically absent. Common bile duct: Diameter: 7.5 mm Liver: No focal lesion identified. Within normal limits in parenchymal echogenicity. Portal vein is patent on color Doppler imaging with normal direction of blood flow towards the liver. Other: Increased echogenicity of the right kidney, partially visualized. IMPRESSION: 1. No acute abnormality identified. 2. Increased echogenicity of the right kidney again seen suggesting medical renal disease. Electronically Signed   By: Ofilia Neas M.D.   On: 11/11/2021 08:53     LOS: 1 day   Antonieta Pert, MD [facility]  11/12/2021, 9:17 AM

## 2021-11-12 NOTE — Progress Notes (Addendum)
Daily Rounding Note  11/12/2021, 9:18 AM  LOS: 1 day   SUBJECTIVE:   Chief complaint: Elevated LFTs.  Feeling better.  OBJECTIVE:         Vital signs in last 24 hours:    Temp:  [97.8 F (36.6 C)-99.5 F (37.5 C)] 97.8 F (36.6 C) (12/18 0603) Pulse Rate:  [64-79] 64 (12/18 0603) Resp:  [12-21] 18 (12/18 0603) BP: (121-165)/(56-90) 132/66 (12/18 0603) SpO2:  [96 %-100 %] 99 % (12/18 0603) Weight:  [60 kg] 60 kg (12/17 1726) Last BM Date: 11/10/21 Filed Weights   11/11/21 0255 11/11/21 1726  Weight: 59 kg 60 kg   General: Pleasant, comfortable.  Not toxic appearing. Heart: RRR. Chest: Clear bilaterally. Abdomen: Soft with some tenderness on the left abdomen (patient states chronic for her).  Not distended.  Bowel sounds active. Extremities: No CCE.  PICC line in right upper thigh. Neuro/Psych: Pleasant, calm, cooperative.  In good spirits.  Intake/Output from previous day: 12/17 0701 - 12/18 0700 In: 2760.1 [I.V.:1000; IV Piggyback:1760.1] Out: 0   Intake/Output this shift: No intake/output data recorded.  Lab Results: Recent Labs    11/11/21 0245 11/12/21 0542  WBC 2.6* 7.3  HGB 8.1* 7.1*  HCT 25.2* 22.2*  PLT 146* 117*   BMET Recent Labs    11/11/21 0245 11/12/21 0542  NA 137 137  K 3.8 4.2  CL 106 108  CO2 24 23  GLUCOSE 85 75  BUN 52* 42*  CREATININE 2.40* 2.05*  CALCIUM 8.9 8.8*   LFT Recent Labs    11/11/21 0245 11/12/21 0542  PROT 6.2* 5.5*  ALBUMIN 2.7* 2.2*  AST 1,742* 579*  ALT 1,418* 935*  ALKPHOS 588* 468*  BILITOT 1.4* 2.1*   PT/INR Recent Labs    11/11/21 0356  LABPROT 13.6  INR 1.0   Hepatitis Panel Recent Labs    11/11/21 0356  HEPBSAG NON REACTIVE  HCVAB NON REACTIVE  HEPAIGM NON REACTIVE  HEPBIGM NON REACTIVE    Studies/Results: CT ABDOMEN PELVIS WO CONTRAST  Result Date: 11/11/2021 CLINICAL DATA:  Suspected biliary obstruction. Fever and  generalized body aches. Abdominal pain. History of Crohn's disease, previous colitis. EXAM: CT ABDOMEN AND PELVIS WITHOUT CONTRAST TECHNIQUE: Multidetector CT imaging of the abdomen and pelvis was performed following the standard protocol without IV contrast. COMPARISON:  There are numerous prior CTs, but the 3 most recent are 10/08/2021, 09/25/2021 and 07/26/2021 FINDINGS: Factors affecting image quality: Very little body fat limits subject contrast, and lack of IV and enteric contrast also limits diagnostic quality. Lower chest: No infiltrate is seen. There are scattered linear scar like opacities. Mild cardiomegaly without pericardial effusion. A 5 mm noncalcified nodule has developed in the posterior basal left lower lobe on series 5 axial 30. Hepatobiliary: Right femoral approach infusion catheter is again noted terminating in the left hepatic vein at the junction with the IVC. Left hepatic pneumobilia is present and not seen previously. The gallbladder is absent. Mild intrahepatic and extrahepatic biliary dilatation is again shown and seems unchanged. Liver parenchyma is grossly unremarkable as far seen. Pancreas: Mild ductal dilatation is again shown without adjacent fat stranding. No focal masses seen without contrast. Spleen: No focal noncontrast abnormality or enlargement. Adrenals/Urinary Tract: There is no adrenal mass. No abnormality of unenhanced renal cortex. There are no stones or hydronephrosis. There is mild bladder thickening versus nondistention. Stomach/Bowel: The gastric wall is chronically thickened proximally but no more than previously.  There are multifocal intestinal surgical changes including a prior right hemicolectomy, with matted and clumped unopacified small bowel limiting evaluation of the bowel but with again noted dilatation of lower abdominal small bowel up to 3.8 cm, suggestion of at least mild wall thickening of some of the clumped segments, with moderate stool retention in the  descending and sigmoid colon but no pericolic inflammatory stranding. There is possible increased wall thickening of the rectum but no increased stranding. Vascular/Lymphatic: Aortic atherosclerosis. No enlarged abdominal or pelvic lymph nodes. Reproductive: The uterus is absent.  No adnexal mass is seen. Other: No abdominal wall hernia or abnormality. No abdominopelvic ascites. There is a general paucity of body fat, mild body wall anasarca. Musculoskeletal: No acute or significant osseous findings. IMPRESSION: 1. Mild intrahepatic and extrahepatic biliary prominence is again noted and has been present on multiple prior studies. The degree of biliary dilatation is no greater than previously but there is a small volume of left hepatic pneumobilia not seen on the last study but was seen on 09/25/2021. This could be due to prior sphincterotomy or infectious process. 2. Multifocal intestinal postsurgical changes including right hemicolectomy, with chronic dilatation of the lower abdominal small bowel with fluid levels. This is also unchanged but there may be increased rectal wall thickening on today's exam. 3. General lack of body fat with body wall anasarca, which could suggest cachexia with malnutrition. 4. Cardiomegaly. 5. New 5 mm left lower lobe nodule. Imaging follow-up not required in a low risk patient. In a high-risk patient, optional 12 month follow-up CT could be considered to ensure stability. 6. Cystitis versus bladder nondistention. Electronically Signed   By: Telford Nab M.D.   On: 11/11/2021 05:51   US Abdomen Limited RUQ (LIVER/GB)  Result Date: 11/11/2021 CLINICAL DATA:  Elevated LFTs. EXAM: ULTRASOUND ABDOMEN LIMITED RIGHT UPPER QUADRANT COMPARISON:  Abdominal ultrasound 07/10/2021 FINDINGS: Gallbladder: Surgically absent. Common bile duct: Diameter: 7.5 mm Liver: No focal lesion identified. Within normal limits in parenchymal echogenicity. Portal vein is patent on color Doppler imaging with  normal direction of blood flow towards the liver. Other: Increased echogenicity of the right kidney, partially visualized. IMPRESSION: 1. No acute abnormality identified. 2. Increased echogenicity of the right kidney again seen suggesting medical renal disease. Electronically Signed   By: Ofilia Neas M.D.   On: 11/11/2021 08:53    Scheduled Meds:  amLODipine  10 mg Oral Daily   buPROPion ER  200 mg Oral Daily   carvedilol  12.5 mg Oral BID WC   Chlorhexidine Gluconate Cloth  6 each Topical Daily   cycloSPORINE  1 drop Both Eyes BID   DULoxetine  120 mg Oral Daily   enoxaparin (LOVENOX) injection  30 mg Subcutaneous Q24H   hydrALAZINE  100 mg Oral TID   methadone  5 mg Oral 5 X Daily   methocarbamol  500 mg Oral BID   pantoprazole (PROTONIX) IV  40 mg Intravenous Q24H   Teduglutide (rDNA)  1.5 mg Subcutaneous Daily   Continuous Infusions:  sodium chloride 75 mL/hr at 11/11/21 0927   levETIRAcetam Stopped (11/11/21 2307)   sulfamethoxazole-trimethoprim Stopped (11/12/21 0420)   PRN Meds:.acetaminophen **OR** acetaminophen, hydrALAZINE, HYDROmorphone (DILAUDID) injection, HYDROmorphone, ondansetron **OR** ondansetron (ZOFRAN) IV, polyvinyl alcohol, promethazine, sodium chloride flush  ASSESMENT:   Recurrent signs of infection/sepsis.  Recently treated for Klebsiella/Acinetobacter bacteremia/sepsis, presumed secondary to PICC line in left thigh.  Completed antibiotics, L  thigh PICC was removed and a new PICC was placed to R thigh  12/9.  Blood culture PCR detected stenotrophomonas.  Septra in place.      Elevated LFTs, question secondary to sepsis?  Prior cholecystectomy.  Had ERCP in 12/20, EUS/ERCP 02/2020.  No new changes to biliary imaging.  IgG, ANA, smooth muscle Ab : pndg, r/O AIH.  CK is below normal.  LFTs are improving.     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   Latest colonoscopy 1 year ago showed no active Crohn's and CRP was normal.  ID wishes to keep TPN on hold.   Lung nodule     CKD.  AKI.     PLAN   Resume TPN when okay with ID.  No recent for her to be n.p.o., I ordered regular diet.  She eats at home, she just cannot absorb adequately.  12:20 PM Addendum:   per d/w Dr Havery Moros, ordering IR consult to obtain liver biopsy.      Caitlin Peters  11/12/2021, 9:18 AM Phone [phone]

## 2021-11-12 NOTE — Progress Notes (Signed)
[place] for Infectious Disease  Date of Admission:  11/11/2021   Total days of inpatient antibiotics 2  Principal Problem:   Fever Active Problems:   Crohn's disease of both small and large intestine with other complication ([name])   Malnutrition ([name])   Short gut syndrome   HTN (hypertension), benign   Chronic pain syndrome   On total parenteral nutrition (TPN)   Elevated liver enzymes   Stage 4 chronic kidney disease ([name])   [name]          Assessment: 60 year old female with short gut syndrome on chronic TPN, multiple hospitalization for line infections admitted with fever.  She is followed by Dr. Baxter Flattery, Infectious disease, seen on 12/6 and doing well.  Infectious disease consulted, she was started on meropenem for possible line infection.  Blood cultures returned positive stenotrophomonas and patient transition to Bactrim.    #Stenotrophomonas maltophilia bacteremia 2/2 Line infection #Chronic TPN #Fever #Right  groin femoral line -1/4 bottles+Stenotrophomonas maltophilia(Cx list as from "right femoral artery", I suspect they may have been through the PICC line) -Suspect bacteremia is 2/2 to line infection  as pt has a femoral line. She states that her femoral line was exchanged on 11/03/21 and has been having fevers since that time.  Recommendations: -Continue bactrim -Remove and replace line -Anticipated treating with 7 days of antibiotics from line removal   #Transaminitis -Waxing and waning since beginning of this year -Suspect it may be 2/2 Keppra   Microbiology:   Antibiotics: Bactrim 12/17-p Meropenem 12/17 Ceftriaxone 12/17 Metronidazole 12/17 Cultures: Blood 12/17 1/4 bottles Stenotrophomonas maltophilia   SUBJECTIVE: Pt is resting in bed. No new complaints. No significant overnight events.   Interval: Afebrile overnight. Wbc 7.3K Review of Systems: Review of Systems  All other systems reviewed and are  negative.   Scheduled Meds:  amLODipine  10 mg Oral Daily   buPROPion ER  200 mg Oral Daily   carvedilol  12.5 mg Oral BID WC   Chlorhexidine Gluconate Cloth  6 each Topical Daily   cycloSPORINE  1 drop Both Eyes BID   DULoxetine  120 mg Oral Daily   [START ON 11/14/2021] enoxaparin (LOVENOX) injection  30 mg Subcutaneous Q24H   hydrALAZINE  100 mg Oral TID   methadone  5 mg Oral 5 X Daily   methocarbamol  500 mg Oral BID   pantoprazole (PROTONIX) IV  40 mg Intravenous Q24H   Teduglutide (rDNA)  1.5 mg Subcutaneous Daily   Continuous Infusions:  sodium chloride 75 mL/hr at 11/12/21 1748   levETIRAcetam 500 mg (11/12/21 1059)   sulfamethoxazole-trimethoprim 160 mg (11/12/21 1828)   PRN Meds:.acetaminophen **OR** acetaminophen, hydrALAZINE, HYDROmorphone (DILAUDID) injection, HYDROmorphone, ondansetron **OR** ondansetron (ZOFRAN) IV, polyvinyl alcohol, promethazine, sodium chloride flush Allergies  Allergen Reactions   Meperidine Hives    Other reaction(s): GI Upset Due to Chrones    Hyoscyamine Hives and Swelling    Legs swelling Disorientation   Cefepime Other (See Comments)    Neurotoxicity occurring in setting of AKI. Ceftriaxone tolerated during same admit   Gabapentin Other (See Comments)    unknown   Lyrica [Pregabalin] Other (See Comments)    Has chronic dehydration and made it worse   Topamax [Topiramate] Other (See Comments)    Has chronic dehydration and made it worse   Zosyn [Piperacillin Sod-Tazobactam So] Other (See Comments)    Patient reports it makes her vomit, her neck stiff, and her "heart feel  funny" Affected kidneys   Fentanyl Rash    Pt is allergic to fentanyl patch related to the glue (gives her a rash) Pt states she is NOT allergic to fentanyl IV medicine   Morphine And Related Rash    OBJECTIVE: Vitals:   11/12/21 0603 11/12/21 1003 11/12/21 1545 11/12/21 1750  BP: 132/66 140/71 119/67 (!) 113/58  Pulse: 64 70 68 68  Resp: 18 18 18    Temp:  97.8 F (36.6 C) 98.7 F (37.1 C) 98.2 F (36.8 C)   TempSrc: Oral Oral Oral   SpO2: 99% 98% 97%   Weight:      Height:       Body mass index is 20.11 kg/m.  Physical Exam Constitutional:      Appearance: Normal appearance.  HENT:     Head: Normocephalic and atraumatic.     Right Ear: Tympanic membrane normal.     Left Ear: Tympanic membrane normal.     Nose: Nose normal.     Mouth/Throat:     Mouth: Mucous membranes are moist.  Eyes:     Extraocular Movements: Extraocular movements intact.     Conjunctiva/sclera: Conjunctivae normal.     Pupils: Pupils are equal, round, and reactive to light.  Cardiovascular:     Rate and Rhythm: Normal rate and regular rhythm.     Heart sounds: No murmur heard.   No friction rub. No gallop.  Pulmonary:     Effort: Pulmonary effort is normal.     Breath sounds: Normal breath sounds.  Abdominal:     General: Abdomen is flat.     Palpations: Abdomen is soft.  Musculoskeletal:        General: Normal range of motion.  Skin:    General: Skin is warm and dry.  Neurological:     General: No focal deficit present.     Mental Status: She is alert and oriented to person, place, and time.  Psychiatric:        Mood and Affect: Mood normal.      Lab Results Lab Results  Component Value Date   WBC 7.3 11/12/2021   HGB 7.1 (L) 11/12/2021   HCT 22.2 (L) 11/12/2021   MCV 96.1 11/12/2021   PLT 117 (L) 11/12/2021    Lab Results  Component Value Date   CREATININE 2.05 (H) 11/12/2021   BUN 42 (H) 11/12/2021   NA 137 11/12/2021   K 4.2 11/12/2021   CL 108 11/12/2021   CO2 23 11/12/2021    Lab Results  Component Value Date   ALT 935 (H) 11/12/2021   AST 579 (H) 11/12/2021   ALKPHOS 468 (H) 11/12/2021   BILITOT 2.1 (H) 11/12/2021        Laurice Record, [place] [facility] 11/12/2021, 6:46 PM

## 2021-11-12 NOTE — Progress Notes (Signed)
PHARMACY - TOTAL PARENTERAL NUTRITION CONSULT NOTE   Indication: Short bowel syndrome, chronic TPN  Patient Measurements: Height: 5' 8"  (172.7 cm) Weight: 60 kg (132 lb 4.4 oz) IBW/kg (Calculated) : 63.9 TPN AdjBW (KG): 60 Body mass index is 20.11 kg/m.  Assessment: 82 YOF presenting with Stenotrophomonas bacteremia likely from line infection. Patient was hospitalized 11/12-21 for bacteremia + PICC infection and was treated with antibiotic lock without line change. Patient then had PICC line change 12/15 with IR. Patient is on chronic TPN for short gut syndrome (due to Crohn's disease s/p multiple resections, TPN with [employer]), with recent PICC line infection and hx of recurrent line infections.  Pharmacy consulted for TPN   Glucose / Insulin: BG < 100, has not needed insulin in past admissions Electrolytes:  Na 137, CoCa 10.2 others wnl Renal: SCr 2.05 (BL~ 1.4), BUN 42 down  Hepatic: Alkphos 468, AST 579, ALT 935- all down trending, Tbili 2.1 up, albumin 2.2, TG 160  Intake / Output; MIVF: NS @ 75, no output charted GI Imaging:  12/17 CT abdomen - hepatic pneumobilia, inc rectal wall thickening, General lack of body fat with body wall anasarca suggests cachexia 12/17 RUQ [place] No acute abnormality identified. GI Surgeries / Procedures: none   Central access: PICC TPN start date: Continued from PTA, 12/17 here  Home TPN: [employer] [phone] Cycle 1800 mL over 12 hours 85 g protein, 220 g dex 20% SMOF 60g 3x week MWF  Nutritional Goals: Goal TPN rate provides TBD  RD Assessment: Pending Last RD recs 11/15  1900-2100 Kcal 90-105 g Protein Fluid needs >/= 2L/day   Current Nutrition:  None  Plan:  Hold TPN for now per ID, will f/u plan daily    Benetta Spar, PharmD, BCPS, [facility] Clinical Pharmacist  Please check AMION for all [place] phone numbers After 10:00 PM, call [place] [phone]

## 2021-11-12 NOTE — Progress Notes (Signed)
PHARMACY - PHYSICIAN COMMUNICATION CRITICAL VALUE ALERT - BLOOD CULTURE IDENTIFICATION (BCID)  Caitlin Peters is an 60 y.o. female who presented to [facility] on 11/11/2021 with a chief complaint of fever, possible line infection  Assessment:   1/2 blood cultures growing Stenotrophomonas maltophilia  Name of physician (or Provider) Contacted:  Dr. Alcario Drought  Current antibiotics:  Meropenem  Changes to prescribed antibiotics recommended:  Change to Bactrim 7.5 mg/kg/day (adjusted for renal dysfunction)  Results for orders placed or performed during the hospital encounter of 11/11/21  Blood Culture ID Panel (Reflexed) (Collected: 11/11/2021  6:35 AM)  Result Value Ref Range   Enterococcus faecalis NOT DETECTED NOT DETECTED   Enterococcus Faecium NOT DETECTED NOT DETECTED   Listeria monocytogenes NOT DETECTED NOT DETECTED   Staphylococcus species NOT DETECTED NOT DETECTED   Staphylococcus aureus (BCID) NOT DETECTED NOT DETECTED   Staphylococcus epidermidis NOT DETECTED NOT DETECTED   Staphylococcus lugdunensis NOT DETECTED NOT DETECTED   Streptococcus species NOT DETECTED NOT DETECTED   Streptococcus agalactiae NOT DETECTED NOT DETECTED   Streptococcus pneumoniae NOT DETECTED NOT DETECTED   Streptococcus pyogenes NOT DETECTED NOT DETECTED   A.calcoaceticus-baumannii NOT DETECTED NOT DETECTED   Bacteroides fragilis NOT DETECTED NOT DETECTED   Enterobacterales NOT DETECTED NOT DETECTED   Enterobacter cloacae complex NOT DETECTED NOT DETECTED   Escherichia coli NOT DETECTED NOT DETECTED   Klebsiella aerogenes NOT DETECTED NOT DETECTED   Klebsiella oxytoca NOT DETECTED NOT DETECTED   Klebsiella pneumoniae NOT DETECTED NOT DETECTED   Proteus species NOT DETECTED NOT DETECTED   Salmonella species NOT DETECTED NOT DETECTED   Serratia marcescens NOT DETECTED NOT DETECTED   Haemophilus influenzae NOT DETECTED NOT DETECTED   Neisseria meningitidis NOT DETECTED NOT DETECTED    Pseudomonas aeruginosa NOT DETECTED NOT DETECTED   Stenotrophomonas maltophilia DETECTED (A) NOT DETECTED   Candida albicans NOT DETECTED NOT DETECTED   Candida auris NOT DETECTED NOT DETECTED   Candida glabrata NOT DETECTED NOT DETECTED   Candida krusei NOT DETECTED NOT DETECTED   Candida parapsilosis NOT DETECTED NOT DETECTED   Candida tropicalis NOT DETECTED NOT DETECTED   Cryptococcus neoformans/gattii NOT DETECTED NOT DETECTED    Caryl Pina 11/12/2021  1:01 AM

## 2021-11-13 DIAGNOSIS — R7881 Bacteremia: Secondary | ICD-10-CM

## 2021-11-13 LAB — CBC
HCT: 22.4 % — ABNORMAL LOW (ref 36.0–46.0)
Hemoglobin: 7.3 g/dL — ABNORMAL LOW (ref 12.0–15.0)
MCH: 31.2 pg (ref 26.0–34.0)
MCHC: 32.6 g/dL (ref 30.0–36.0)
MCV: 95.7 fL (ref 80.0–100.0)
Platelets: 135 10*3/uL — ABNORMAL LOW (ref 150–400)
RBC: 2.34 MIL/uL — ABNORMAL LOW (ref 3.87–5.11)
RDW: 13.5 % (ref 11.5–15.5)
WBC: 6 10*3/uL (ref 4.0–10.5)
nRBC: 0 % (ref 0.0–0.2)

## 2021-11-13 LAB — IGG: IgG (Immunoglobin G), Serum: 873 mg/dL (ref 586–1602)

## 2021-11-13 LAB — PHOSPHORUS: Phosphorus: 3.4 mg/dL (ref 2.5–4.6)

## 2021-11-13 LAB — ANA W/REFLEX IF POSITIVE: Anti Nuclear Antibody (ANA): NEGATIVE

## 2021-11-13 LAB — COMPREHENSIVE METABOLIC PANEL
ALT: 628 U/L — ABNORMAL HIGH (ref 0–44)
AST: 221 U/L — ABNORMAL HIGH (ref 15–41)
Albumin: 2 g/dL — ABNORMAL LOW (ref 3.5–5.0)
Alkaline Phosphatase: 420 U/L — ABNORMAL HIGH (ref 38–126)
Anion gap: 5 (ref 5–15)
BUN: 30 mg/dL — ABNORMAL HIGH (ref 6–20)
CO2: 23 mmol/L (ref 22–32)
Calcium: 8.4 mg/dL — ABNORMAL LOW (ref 8.9–10.3)
Chloride: 107 mmol/L (ref 98–111)
Creatinine, Ser: 2.17 mg/dL — ABNORMAL HIGH (ref 0.44–1.00)
GFR, Estimated: 25 mL/min — ABNORMAL LOW (ref >60)
Glucose, Bld: 138 mg/dL — ABNORMAL HIGH (ref 70–99)
Potassium: 4 mmol/L (ref 3.5–5.1)
Sodium: 135 mmol/L (ref 135–145)
Total Bilirubin: 1.1 mg/dL (ref 0.3–1.2)
Total Protein: 5.4 g/dL — ABNORMAL LOW (ref 6.5–8.1)

## 2021-11-13 LAB — TRIGLYCERIDES: Triglycerides: 144 mg/dL (ref <150)

## 2021-11-13 LAB — MAGNESIUM: Magnesium: 2.1 mg/dL (ref 1.7–2.4)

## 2021-11-13 LAB — ANTI-SMOOTH MUSCLE ANTIBODY, IGG: F-Actin IgG: 8 Units (ref 0–19)

## 2021-11-13 MED ORDER — LEVOFLOXACIN IN D5W 750 MG/150ML IV SOLN
750.0000 mg | INTRAVENOUS | Status: DC
  Administered 2021-11-15: 750 mg via INTRAVENOUS
  Filled 2021-11-13: qty 150

## 2021-11-13 MED ORDER — PANCRELIPASE (LIP-PROT-AMYL) 36000-114000 UNITS PO CPEP
36000.0000 [IU] | ORAL_CAPSULE | Freq: Three times a day (TID) | ORAL | Status: DC
  Administered 2021-11-13 – 2021-11-16 (×9): 36000 [IU] via ORAL
  Filled 2021-11-13 (×9): qty 1

## 2021-11-13 MED ORDER — ADULT MULTIVITAMIN W/MINERALS CH
1.0000 | ORAL_TABLET | Freq: Every day | ORAL | Status: DC
  Administered 2021-11-16: 1 via ORAL
  Filled 2021-11-13 (×4): qty 1

## 2021-11-13 MED ORDER — LEVOFLOXACIN IN D5W 750 MG/150ML IV SOLN
750.0000 mg | Freq: Once | INTRAVENOUS | Status: AC
  Administered 2021-11-13: 750 mg via INTRAVENOUS
  Filled 2021-11-13: qty 150

## 2021-11-13 MED ORDER — ENSURE ENLIVE PO LIQD
237.0000 mL | Freq: Two times a day (BID) | ORAL | Status: DC
  Administered 2021-11-14 – 2021-11-16 (×5): 237 mL via ORAL

## 2021-11-13 NOTE — Progress Notes (Signed)
PHARMACY - TOTAL PARENTERAL NUTRITION CONSULT NOTE   Indication: Short bowel syndrome, chronic TPN  Patient Measurements: Height: 5' 8"  (172.7 cm) Weight: 60 kg (132 lb 4.4 oz) IBW/kg (Calculated) : 63.9 TPN AdjBW (KG): 60 Body mass index is 20.11 kg/m.  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  Assessment: 21 YOF presenting with Stenotrophomonas bacteremia likely from line infection. Patient was hospitalized 11/12-21 for bacteremia + PICC infection and was treated with antibiotic lock without line change. Patient then had PICC line change 12/15 with IR. Patient is on chronic TPN for short gut syndrome (due to Crohn's disease s/p multiple resections, TPN with [employer]), with recent PICC line infection and hx of recurrent line infections.  Pharmacy consulted for TPN   Glucose / Insulin: BG < 100, has not needed insulin in past admissions. 138 on BMET Electrolytes:  CoCa 10, WNL Renal: SCr 2.17 (BL~ 1.4), BUN 30 down  Hepatic: Acute liver dysfunction/transaminitis: Alkphos 626>948, AST 579>221, ALT 935>628- all down trending, Tbili 2.1>1.1, albumin 2, TG 144 Intake / Output; MIVF: NS @ 75, no output charted GI Imaging:  12/17 CT abdomen - hepatic pneumobilia, inc rectal wall thickening, General lack of body fat with body wall anasarca suggests cachexia 12/17 RUQ [place] No acute abnormality identified. GI Surgeries / Procedures: none   Central access: PICC TPN start date: Continued from PTA, 12/17 here  Home TPN: [employer] [phone] Cycle 1800 mL over 12 hours 85 g protein, 220 g dex 20% SMOF 60g 3x week MWF  Nutritional Goals: Goal TPN rate provides TBD  RD Assessment: Pending Last RD recs 11/15  1900-2100 Kcal 90-105 g Protein Fluid needs >/= 2L/day    Current Nutrition:  12/18: REGULAR DIET AS PTA  Plan:  Hold TPN for now per ID Liver bx. Pharmacy will sign off. Please reconsult for further dosing assitance. When ready to resume TPN     Benetta Spar, PharmD, BCPS, [facility] [place] Clinical Pharmacist  Please check AMION for all [place] phone numbers After 10:00 PM, call [place] [phone]

## 2021-11-13 NOTE — Progress Notes (Signed)
° °   Progress Note   Subjective  Chief Complaint: Elevated LFTs  Patient is in good spirits today, tells me that she has continued to feel better.  Tells me she spoke with interventional radiology earlier who are going to try liver biopsy and port placement at the same time, when she is not running any fever..  Denies any new complaints or concerns.   Objective   Vital signs in last 24 hours: Temp:  [98.2 F (36.8 C)-98.5 F (36.9 C)] 98.2 F (36.8 C) (12/19 0844) Pulse Rate:  [65-78] 65 (12/19 0844) Resp:  [16-20] 20 (12/19 0844) BP: (113-147)/(55-73) 147/67 (12/19 0844) SpO2:  [97 %-99 %] 98 % (12/19 0844) Last BM Date: 11/12/21 General:    AA female in NAD Heart:  Regular rate and rhythm; no murmurs Lungs: Respirations even and unlabored, lungs CTA bilaterally Abdomen:  Soft, moderate generalized TTP with involuntary guarding, worse on the left side of her abdomen, and nondistended. Normal bowel sounds. Psych:  Cooperative. Normal mood and affect.  Intake/Output from previous day: 12/18 0701 - 12/19 0700 In: 4158 [P.O.:1430; I.V.:1747.9; IV Piggyback:980.1] Out: 3 [Urine:2; Stool:1] Intake/Output this shift: Total I/O In: 240 [P.O.:240] Out: -   Lab Results: Recent Labs    11/11/21 0245 11/12/21 0542 11/13/21 0325  WBC 2.6* 7.3 6.0  HGB 8.1* 7.1* 7.3*  HCT 25.2* 22.2* 22.4*  PLT 146* 117* 135*   BMET Recent Labs    11/11/21 0245 11/12/21 0542 11/13/21 0325  NA 137 137 135  K 3.8 4.2 4.0  CL 106 108 107  CO2 24 23 23   GLUCOSE 85 75 138*  BUN 52* 42* 30*  CREATININE 2.40* 2.05* 2.17*  CALCIUM 8.9 8.8* 8.4*   LFT Recent Labs    11/13/21 0325  PROT 5.4*  ALBUMIN 2.0*  AST 221*  ALT 628*  ALKPHOS 420*  BILITOT 1.1   PT/INR Recent Labs    11/11/21 0356  LABPROT 13.6  INR 1.0    Assessment / Plan:   Assessment: 1.  Recurrent signs of infection/sepsis: Recently treated for Klebsiella/Acinetobacter, presumed secondary to PICC line in left  thigh, completed antibiotics, PICC line was removed and new PICC placed in right thigh 12/9, blood culture PCR detected stenotrophomonas, currently on Septra 2.  Elevated LFTs: prior cholecystectomy, had ERCP on 12/20, EUS/ERCP 4/21, no new changes to biliary imaging, LFTs improving, the liver serologies pending (baseline LFTs in the 100s-200s); consideration of relation to sepsis versus DILI (hydralazine use) 3.  Crohn's disease with extensive bowel resection: Resulting in short gut syndrome, malabsorption and now dependent on Gattex and TPN since 2003, latest colonoscopy 1 year ago showed no active Crohn's and CRP was normal 4.  CKD: With AKI  Plan: 1.  Continue TPN and regular diet 2.  IR seeing the patient in regards to possible liver biopsy 3.  Continue to monitor LFTs, but they are trending down nicely 4.  We will likely follow peripherally until results of liver biopsy unless LFT's trend upward  Thank you for your kind consultation.   LOS: 2 days   Levin Erp  11/13/2021, 10:50 AM

## 2021-11-13 NOTE — TOC Initial Note (Signed)
Transition of Care [facility]) - Initial/Assessment Note    Patient Details  Name: Caitlin Peters MRN: [id number] Date of Birth: 1961/02/01  Transition of Care [facility]) CM/SW Contact:    Tom-Johnson, Renea Ee, RN Phone Number: 11/13/2021, 2:04 PM  Clinical Narrative:                  Transition of Care [facility]) Screening Note   Patient Details  Name: Caitlin Peters Date of Birth: Nov 26, 1961   Transition of Care [facility]) CM/SW Contact:    Tom-Johnson, Renea Ee, RN Phone Number: 11/13/2021, 2:04 PM  Patient is from home with daughter. Presented to the ED with a temp of 101.6. Patient is on chronic TPN through Rt thigh PICC for malnutrition and has short gut syndrome. Patient has had frequent hospitalization from PICC line infection and most recent discharge was 10/16/21. Possible PICC line exchange today. Currently on IV antibiotic. Patient is active with [employer] for TPN supplies and [employer] for RN services. No PT/OT recommendations noted. Transition of Care Department [facility]) has reviewed patient and no TOC needs have been identified at this time. We will continue to monitor patient advancement through interdisciplinary progression rounds. If new patient transition needs arise, please place a TOC consult.      Expected Discharge Plan: Home/Self Care Barriers to Discharge: Continued Medical Work up   Patient Goals and CMS Choice Patient states their goals for this hospitalization and ongoing recovery are:: To go home CMS Medicare.gov Compare Post Acute Care list provided to:: Patient Choice offered to / list presented to : NA  Expected Discharge Plan and Services Expected Discharge Plan: Home/Self Care   Discharge Planning Services: CM Consult Post Acute Care Choice: NA Living arrangements for the past 2 months: Apartment                 DME Arranged: N/A DME Agency: NA       HH Arranged: NA [place] Agency: NA        Prior Living Arrangements/Services Living  arrangements for the past 2 months: Apartment Lives with:: Adult Children (Daughter) Patient language and need for interpreter reviewed:: Yes Do you feel safe going back to the place where you live?: Yes      Need for Family Participation in Patient Care: Yes (Comment) Care giver support system in place?: Yes (comment)   Criminal Activity/Legal Involvement Pertinent to Current Situation/Hospitalization: No - Comment as needed  Activities of Daily Living Home Assistive Devices/Equipment: Walker (specify type) ADL Screening (condition at time of admission) Patient's cognitive ability adequate to safely complete daily activities?: Yes Is the patient deaf or have difficulty hearing?: No Does the patient have difficulty seeing, even when wearing glasses/contacts?: No Does the patient have difficulty concentrating, remembering, or making decisions?: No Patient able to express need for assistance with ADLs?: Yes Does the patient have difficulty dressing or bathing?: No Independently performs ADLs?: Yes (appropriate for developmental age) Does the patient have difficulty walking or climbing stairs?: No Weakness of Legs: Both Weakness of Arms/Hands: None  Permission Sought/Granted Permission sought to share information with : Case Manager, Family Supports, Chartered certified accountant granted to share information with : Yes, Verbal Permission Granted              Emotional Assessment Appearance:: Appears stated age Attitude/Demeanor/Rapport: Engaged, Gracious Affect (typically observed): Accepting, Appropriate, Calm, Hopeful Orientation: : Oriented to Self, Oriented to Place, Oriented to  Time, Oriented to Situation Alcohol / Substance Use: Not Applicable  Psych Involvement: No (comment)  Admission diagnosis:  Fever [R50.9] LFT elevation [R79.89] Abdominal pain, unspecified abdominal location [R10.9] Fever, unspecified fever cause [R50.9] Patient Active Problem List    Diagnosis Date Noted   SBO (small bowel obstruction) ([place]) 10/08/2021   Vertebral osteomyelitis ([place])    Bloodstream infection due to central venous catheter    Thrombocytopenia ([name])    Malnutrition of moderate degree 07/06/2021   Transaminitis    Hypophosphatemia    Chronic idiopathic constipation    Left shoulder pain 05/23/2021   Elevated LFTs    Sepsis ([place]) 05/06/2021   Acidosis 04/08/2021   Colitis 04/07/2021   Intractable nausea and vomiting 03/11/2021   Thoracic discitis 02/10/2021   Pressure injury of skin 12/18/2020   Bacteremia 12/17/2020   Seizures ([place]) 12/17/2020   Drug-seeking behavior 12/16/2020   Seizure ([place]) 12/15/2020   Stage 4 chronic kidney disease ([place]) 12/06/2020   COVID-19 virus infection 12/06/2020   Protein calorie malnutrition ([place]) 12/06/2020   Palpitations 11/22/2020   PAC (premature atrial contraction) 11/22/2020   History of central line-associated bloodstream infection ([facility]) 10/21/2020   Nausea & vomiting 10/21/2020   Choledocholithiasis (sludge) s/p ERCP 10/2019 10/21/2020   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   Abdominal pain 24/26/8341   Acute metabolic encephalopathy    Hypomagnesemia    Partial small bowel obstruction ([name])    Bacteremia associated with intravascular line ([place]) 08/04/2020   Enterobacter sepsis ([place]) 07/22/2020   Fever of unknown origin 06/27/2020   Anxiety 06/03/2020   Acute pancreatitis 04/13/2020   Infection due to Acinetobacter species 03/10/2020   Pancytopenia ([place]) 03/05/2020   Central line infection    Elevated liver enzymes 01/02/2020   Cholangitis    Anasarca 10/28/2019   Acute kidney injury superimposed on chronic kidney disease ([place]) 10/28/2019   Falls 10/28/2019   Malnutrition ([place])    Vitamin B12 deficiency    GERD (gastroesophageal reflux disease)    Chronic pain syndrome    Hypokalemia due to excessive gastrointestinal loss of potassium 10/13/2019   Fever    Hypokalemia 10/12/2019    Chronic diarrhea 10/12/2019   Dysuria 10/12/2019   Bilateral lower extremity edema 10/12/2019   Hypertension 10/12/2019   AKI (acute kidney injury) ([place]) 10/12/2019   Candidemia ([place]) 08/27/2019   IDA (iron deficiency anemia) 11/03/2018   Dilation of biliary tract 08/28/2018   Severe diarrhea 03/07/2018   LFTs abnormal 01/27/2018   GI tract obstruction ([place]) 01/27/2018   Bacteremia due to Klebsiella pneumoniae 09/13/2017   HTN (hypertension), benign 12/02/2016   Intractable pain 12/02/2016   Anemia 12/02/2016   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   Avascular necrosis ([place]) 06/14/2015   Polyarthralgia 05/03/2015   On total parenteral nutrition (TPN) 12/30/2014   Osteoporosis 12/24/2014   Low back pain 12/16/2014   Vitamin D deficiency 12/16/2014   Short gut syndrome 07/15/2014   History of colonic diverticulitis 2014   Depression 07/24/2012   Gram-positive bacteremia 07/24/2012   Small bowel obstruction due to adhesions ([place]) 07/24/2012   Diarrhea 12/13/2011   Neuralgia and neuritis 06/01/2011   Myalgia and myositis 08/12/2003   Crohn's disease of both small and large intestine with other complication ([place]) [id number]   PCP:  [name], [name], MD Pharmacy:   [facility], [address] [place] [address] Phone: [phone] Fax: [phone]     Social Determinants of Health (SDOH) Interventions    Readmission Risk Interventions Readmission Risk Prevention Plan 09/26/2021  06/22/2021 02/10/2021  Transportation Screening Complete Complete Complete  PCP or Specialist Appt within 3-5 Days - - -  Not Complete comments - - -  HRI or [place] for [place] consult not completed comments - - -  Palliative Care Screening - - -  Medication Review Press photographer) Complete Complete Complete  PCP or Specialist appointment within 3-5 days of  discharge - Complete -  [place] or [place] Complete Complete Complete  SW Recovery Care/Counseling Consult Complete Complete Complete  Palliative Care Screening Not Applicable Not Applicable Not [place] Not Applicable Not Applicable Not Applicable

## 2021-11-13 NOTE — Consult Note (Signed)
Chief Complaint: Patient was seen in consultation today for liver core biopsy and Rt femoral tunneled central catheter exchange Chief Complaint  Patient presents with   Abdominal Pain   at the request of Dr Doyne Keel Dr Netta Cedars   Supervising Physician: Aletta Edouard  Patient Status: [facility] - In-pt  History of Present Illness: Caitlin Peters is a 60 y.o. female   Known abn liver functions off and on for years Hx Chrons and short gut--- many lines in past--- infections occasionally Infections cause LFTS to rise also per pt +BC 11/11/21: Stenotrophomonas maltophilia  Dr Havery Moros note yesterday:  Ultimately given the chronicity of her liver enzyme elevation and fluctuations, to clarify what is causing this and help evaluate for any fibrotic change that has resulted from this, a liver biopsy is the next step. We discussed what this is, risks. Her infection is improving, she does want to pursue a liver biopsy after discussion of this and will have IR see her tomorrow. Autoimmune serologies sent and otherwise pending. Continue to trend LFTs   She has new line in Rt fem vein-- placed at [facility] per pt  Last Line in IR 07/14/21 - Dr Laurence Ferrari IMPRESSION: Successful right femoral dual lumen tunneled power line placement with ultrasound and fluoroscopic guidance. The catheter is ready for use. If she develops recurrent issues were recurrent infection due to the femoral placement of this catheter, she may be a candidate for right-sided central venous recanalization in the future.   Request made for liver core biopsy and  Line exchange in IR Discussed with Dr Kathlene Cote Only one procedure a day Plan for liver core biopsy today    Past Medical History:  Diagnosis Date   Acute pancreatitis 04/13/2020   Anasarca 10/2019   AVN (avascular necrosis of bone) ([name])    Cataract    Choledocholithiasis (sludge) s/p ERCP 10/2019 10/21/2020   Chronic pain syndrome    CKD  (chronic kidney disease), stage III ([name])    Depression    Diverticulosis    GERD (gastroesophageal reflux disease)    HTN (hypertension)    IDA (iron deficiency anemia)    Malnutrition ([place])    Mass in chest    Osteoporosis 12/24/2014   Pancreatitis    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; 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; jejunal to transverse colon anastomosis Treated at [facility] lengthening to 165cm Dr Alene Mires, [facility]   Vitamin B12 deficiency     Past Surgical History:  Procedure Laterality Date   ABDOMINAL ADHESION SURGERY  01/22/2018   APPENDECTOMY  1989   BILIARY DILATION  11/26/2019   Procedure: BILIARY DILATION;  Surgeon: Jackquline Denmark, MD;  Location: [facility] ENDOSCOPY;  Service: Endoscopy;;   BILIARY DILATION  03/08/2020   Procedure: BILIARY DILATION;  Surgeon: Irving Copas., MD;  Location: [place];  Service: Gastroenterology;;   BIOPSY  03/08/2020   Procedure: BIOPSY;  Surgeon: Irving Copas., MD;  Location: [facility] ENDOSCOPY;  Service: Gastroenterology;;   CHEST WALL RESECTION     right thoracotomy,resection of chest mass with anterior rib and reconstruction using prosthetic mesh and video arthroscopy   CHOLECYSTECTOMY  01/22/2018   COLONOSCOPY  2019   ENTEROSTOMY CLOSURE  04/1999   ERCP N/A 11/26/2019   Procedure: ENDOSCOPIC RETROGRADE CHOLANGIOPANCREATOGRAPHY (ERCP);  Surgeon: Jackquline Denmark, MD;  Location: [facility];  Service: Endoscopy;  Laterality: N/A;   ERCP N/A 03/08/2020   Procedure: ENDOSCOPIC RETROGRADE CHOLANGIOPANCREATOGRAPHY (ERCP);  Surgeon: Irving Copas., MD;  Location: [facility];  Service: Gastroenterology;  Laterality: N/A;   ESOPHAGOGASTRODUODENOSCOPY N/A 03/08/2020   Procedure: ESOPHAGOGASTRODUODENOSCOPY (EGD);  Surgeon: Irving Copas., MD;  Location: [facility];  Service: Gastroenterology;   Laterality: N/A;   EUS N/A 03/08/2020   Procedure: UPPER ENDOSCOPIC ULTRASOUND (EUS) LINEAR;  Surgeon: Irving Copas., MD;  Location: [employer];  Service: Gastroenterology;  Laterality: N/A;   ILEOCECETOMY  03/1999   ileocolon resection with abdominal stoma   ILEOSTOMY CLOSURE  2001   IR FLUORO GUIDE CV LINE LEFT  01/07/2020   IR FLUORO GUIDE CV LINE LEFT  03/09/2020   IR FLUORO GUIDE CV LINE LEFT  05/09/2020   IR FLUORO GUIDE CV LINE LEFT  07/20/2020   IR FLUORO GUIDE CV LINE RIGHT  08/05/2020   IR FLUORO GUIDE CV LINE RIGHT  04/03/2021   IR PTA VENOUS EXCEPT DIALYSIS CIRCUIT  01/07/2020   IR REMOVAL TUN CV CATH W/O FL  08/05/2020   IR REMOVAL TUN CV CATH W/O FL  07/11/2021   IR US GUIDE VASC ACCESS LEFT     x 2 06/17/19 and 09/14/2019   IR US GUIDE VASC ACCESS RIGHT  08/05/2020   KNEE SURGERY     right knee    LAPAROSCOPIC SMALL BOWEL RESECTION  2009   2000-2009.  SB resections for Crohns Disease - now with Short gut   OMENTECTOMY  01/22/2018   PARTIAL HYSTERECTOMY  1984   with LSO   REMOVAL OF STONES  11/26/2019   Procedure: REMOVAL OF STONES;  Surgeon: Jackquline Denmark, MD;  Location: [facility];  Service: Endoscopy;;   REMOVAL OF STONES  03/08/2020   Procedure: REMOVAL OF STONES;  Surgeon: Irving Copas., MD;  Location: [facility] ENDOSCOPY;  Service: Gastroenterology;;   SALPINGOOPHORECTOMY Left 1984   SALPINGOOPHORECTOMY Right 1990   SERIAL TRANSVERSE ENTEROPLASTY (STEP) - SMALL BOWEL LENGTHENING  01/22/2018   Dr Alene Mires, [facility] - SB length from 120 to 165cm    SMALL INTESTINE SURGERY  2002   SMALL INTESTINE SURGERY  2003   SPHINCTEROTOMY  11/26/2019   Procedure: SPHINCTEROTOMY;  Surgeon: Jackquline Denmark, MD;  Location: [facility]OS[facility];  Service: Endoscopy;;   TOTAL ABDOMINAL HYSTERECTOMY  1990   with RSO   UPPER GASTROINTESTINAL ENDOSCOPY      Allergies: Meperidine, Hyoscyamine, Cefepime, Gabapentin, Lyrica [pregabalin], Topamax [topiramate], Zosyn [piperacillin  sod-tazobactam so], Fentanyl, and Morphine and related  Medications: Prior to Admission medications   Medication Sig Start Date End Date Taking? Authorizing Provider  acetaminophen (TYLENOL) 650 MG CR tablet Take 1,300 mg by mouth every 6 (six) hours.   Yes [provider]  amLODipine (NORVASC) 10 MG tablet Take 1 tablet by mouth once daily Patient taking differently: Take 10 mg by mouth daily. 08/22/21  Yes [name], [name], MD  buPROPion ER [facility] SR) 100 MG 12 hr tablet Take 2 tablets by mouth once daily Patient taking differently: Take 200 mg by mouth daily. 08/30/21  Yes [name], [name], MD  Calcium 500 MG tablet Take 500 mg by mouth daily.   Yes [provider]  carvedilol (COREG) 12.5 MG tablet Take 1 tablet (12.5 mg total) by mouth 2 (two) times daily with a meal. 09/29/21  Yes Little Ishikawa, MD  cholecalciferol (VITAMIN D3) 25 MCG (1000 UT) tablet Take 1,000 Units by mouth daily.  Yes [provider]  cycloSPORINE (RESTASIS) 0.05 % ophthalmic emulsion Place 1 drop into both eyes 2 (two) times daily.   Yes [provider]  DEXILANT 60 MG capsule Take 1 capsule by mouth once daily Patient taking differently: Take 60 mg by mouth daily. 08/01/21  Yes [name], [name], MD  diphenoxylate-atropine (LOMOTIL) 2.5-0.025 MG tablet Take 1 tablet by mouth 2 (two) times daily as needed for diarrhea or loose stools. 09/04/21  Yes [provider]  DULoxetine (CYMBALTA) 60 MG capsule Take 2 capsules (120 mg total) by mouth daily. 09/15/21  Yes [name], [name], MD  estradiol (ESTRACE) 2 MG tablet Take 1 tablet by mouth once daily Patient taking differently: Take 2 mg by mouth daily. 10/03/21  Yes [name], [name], MD  famotidine (PEPCID) 20 MG tablet Take 20 mg by mouth daily as needed for heartburn or indigestion.   Yes [provider]  GATTEX 5 MG KIT Inject 1.5 mg into the skin daily.  01/03/21  Yes [provider]  hydrALAZINE (APRESOLINE) 100 MG tablet Take 1 tablet (100 mg total) by mouth 3 (three) times daily. 11/07/21  Yes [name], [name], MD  HYDROmorphone (DILAUDID) 4 MG tablet Take 4 mg by mouth 4 (four) times daily. 04/18/21  Yes [provider]  levETIRAcetam (KEPPRA) 500 MG tablet Take 1 tablet (500 mg total) by mouth 2 (two) times daily. 08/28/21  Yes Cameron Sprang, MD  lipase/protease/amylase (CREON) 36000 UNITS CPEP capsule Take 1 capsule (36,000 Units total) by mouth See admin instructions. 36000 units with meals Patient taking differently: Take 36,000 Units by mouth 3 (three) times daily with meals. 06/30/21  Yes Eugenie Filler, MD  loperamide (IMODIUM) 2 MG capsule Take 4 mg by mouth 2 (two) times daily as needed for diarrhea or loose stools. 09/26/21  Yes [provider]  methadone (DOLOPHINE) 5 MG tablet Take 1 tablet (5 mg total) by mouth 5 (five) times daily. 09/29/21  Yes Little Ishikawa, MD  methocarbamol (ROBAXIN) 500 MG tablet Take 1 tablet (500 mg total) by mouth in the morning and at bedtime. 06/13/21  Yes [name], [name], MD  Multiple Vitamins-Minerals (MULTIVITAMIN ADULT PO) Take 1 tablet by mouth daily.   Yes [provider]  polyvinyl alcohol (LIQUIFILM TEARS) 1.4 % ophthalmic solution Place 1 drop into both eyes as needed for dry eyes.   Yes [provider]  PRESCRIPTION MEDICATION Inject 1 each into the vein daily. Home TPN . [facility] . 1 bag for 12 hours. [id number]   Yes [provider]  promethazine (PHENERGAN) 25 MG tablet TAKE 1 TABLET BY MOUTH EVERY 6 HOURS AS NEEDED FOR NAUSEA FOR VOMITING Patient taking differently: Take 25 mg by mouth every 6 (six) hours as needed for nausea or vomiting. 10/25/21  Yes [name], [name], MD  sodium chloride 0.9 % infusion Inject 1,000 mLs into the vein daily. For chronic dehydration 10/16/20   Yes [provider]  sucralfate (CARAFATE) 1 GM/10ML suspension Take 10 mLs (1 g total) by mouth 2 (two) times daily. 06/30/21  Yes Eugenie Filler, MD  Trace Minerals Cu-Mn-Se-Zn (TRALEMENT IV) Inject 1 mL into the vein See admin instructions. Used in TPN bag 4 times weekly   Yes [provider]  vitamin B-12 (CYANOCOBALAMIN) 1000 MCG tablet Take 1,000 mcg by mouth daily.    Yes [provider]  lidocaine (LIDODERM) 5 %  Place 1 patch onto the skin daily. Remove & Discard patch within 12 hours or as directed by MD Patient not taking: Reported on 11/11/2021 06/30/21   Eugenie Filler, MD  [facility] 4 MG/0.1ML LIQD nasal spray kit Place 1 spray into the nose once as needed (overdose). Patient not taking: Reported on 10/31/2021 10/14/20   [provider]     Family History  Problem Relation Age of Onset   Seizures Mother    Glaucoma Mother    CAD Father    Heart disease Father    Hypertension Father    Breast cancer Sister    Multiple sclerosis Sister    Diabetes Sister    Lupus Sister    Colon cancer Other    Crohn's disease Other     Social History   Socioeconomic History   Marital status: Single    Spouse name: Not on file   Number of children: Not on file   Years of education: Not on file   Highest education level: Not on file  Occupational History   Occupation: disabled  Tobacco Use   Smoking status: Former    Packs/day: 0.50    Years: 35.00    Pack years: 17.50    Types: Cigarettes   Smokeless tobacco: Never  Vaping Use   Vaping Use: Never used  Substance and Sexual Activity   Alcohol use: Not Currently   Drug use: Never   Sexual activity: Yes  Other Topics Concern   Not on file  Social History Narrative   Right handed   One story apartment   No caffeine   Social Determinants of Health   Financial Resource Strain: Not on file  Food Insecurity: Not on file  Transportation Needs: Not on file  Physical Activity: Not on  file  Stress: Not on file  Social Connections: Not on file    Review of Systems: A 12 point ROS discussed and pertinent positives are indicated in the HPI above.  All other systems are negative.  Review of Systems  Constitutional:  Positive for activity change. Negative for fatigue and fever.  Respiratory:  Negative for cough and shortness of breath.   Cardiovascular:  Negative for chest pain.  Gastrointestinal:  Negative for abdominal pain.  Neurological:  Positive for weakness.  Psychiatric/Behavioral:  Negative for behavioral problems and confusion.    Vital Signs: BP (!) 147/67    Pulse 65    Temp 98.2 F (36.8 C) (Oral)    Resp 20    Ht 5' 8"  (1.727 m)    Wt 132 lb 4.4 oz (60 kg)    SpO2 98%    BMI 20.11 kg/m   Physical Exam Vitals reviewed.  Cardiovascular:     Rate and Rhythm: Normal rate and regular rhythm.  Pulmonary:     Effort: Pulmonary effort is normal.     Breath sounds: Normal breath sounds.  Abdominal:     General: Bowel sounds are normal.     Tenderness: There is no abdominal tenderness.  Skin:    General: Skin is warm.  Neurological:     Mental Status: She is alert and oriented to person, place, and time.  Psychiatric:        Behavior: Behavior normal.    Imaging: CT ABDOMEN PELVIS WO CONTRAST  Result Date: 11/11/2021 CLINICAL DATA:  Suspected biliary obstruction. Fever and generalized body aches. Abdominal pain. History of Crohn's disease, previous colitis. EXAM: CT ABDOMEN AND PELVIS WITHOUT CONTRAST TECHNIQUE: Multidetector  CT imaging of the abdomen and pelvis was performed following the standard protocol without IV contrast. COMPARISON:  There are numerous prior CTs, but the 3 most recent are 10/08/2021, 09/25/2021 and 07/26/2021 FINDINGS: Factors affecting image quality: Very little body fat limits subject contrast, and lack of IV and enteric contrast also limits diagnostic quality. Lower chest: No infiltrate is seen. There are scattered linear scar  like opacities. Mild cardiomegaly without pericardial effusion. A 5 mm noncalcified nodule has developed in the posterior basal left lower lobe on series 5 axial 30. Hepatobiliary: Right femoral approach infusion catheter is again noted terminating in the left hepatic vein at the junction with the IVC. Left hepatic pneumobilia is present and not seen previously. The gallbladder is absent. Mild intrahepatic and extrahepatic biliary dilatation is again shown and seems unchanged. Liver parenchyma is grossly unremarkable as far seen. Pancreas: Mild ductal dilatation is again shown without adjacent fat stranding. No focal masses seen without contrast. Spleen: No focal noncontrast abnormality or enlargement. Adrenals/Urinary Tract: There is no adrenal mass. No abnormality of unenhanced renal cortex. There are no stones or hydronephrosis. There is mild bladder thickening versus nondistention. Stomach/Bowel: The gastric wall is chronically thickened proximally but no more than previously. There are multifocal intestinal surgical changes including a prior right hemicolectomy, with matted and clumped unopacified small bowel limiting evaluation of the bowel but with again noted dilatation of lower abdominal small bowel up to 3.8 cm, suggestion of at least mild wall thickening of some of the clumped segments, with moderate stool retention in the descending and sigmoid colon but no pericolic inflammatory stranding. There is possible increased wall thickening of the rectum but no increased stranding. Vascular/Lymphatic: Aortic atherosclerosis. No enlarged abdominal or pelvic lymph nodes. Reproductive: The uterus is absent.  No adnexal mass is seen. Other: No abdominal wall hernia or abnormality. No abdominopelvic ascites. There is a general paucity of body fat, mild body wall anasarca. Musculoskeletal: No acute or significant osseous findings. IMPRESSION: 1. Mild intrahepatic and extrahepatic biliary prominence is again noted and  has been present on multiple prior studies. The degree of biliary dilatation is no greater than previously but there is a small volume of left hepatic pneumobilia not seen on the last study but was seen on 09/25/2021. This could be due to prior sphincterotomy or infectious process. 2. Multifocal intestinal postsurgical changes including right hemicolectomy, with chronic dilatation of the lower abdominal small bowel with fluid levels. This is also unchanged but there may be increased rectal wall thickening on today's exam. 3. General lack of body fat with body wall anasarca, which could suggest cachexia with malnutrition. 4. Cardiomegaly. 5. New 5 mm left lower lobe nodule. Imaging follow-up not required in a low risk patient. In a high-risk patient, optional 12 month follow-up CT could be considered to ensure stability. 6. Cystitis versus bladder nondistention. Electronically Signed   By: Telford Nab M.D.   On: 11/11/2021 05:51   US Abdomen Limited RUQ (LIVER/GB)  Result Date: 11/11/2021 CLINICAL DATA:  Elevated LFTs. EXAM: ULTRASOUND ABDOMEN LIMITED RIGHT UPPER QUADRANT COMPARISON:  Abdominal ultrasound 07/10/2021 FINDINGS: Gallbladder: Surgically absent. Common bile duct: Diameter: 7.5 mm Liver: No focal lesion identified. Within normal limits in parenchymal echogenicity. Portal vein is patent on color Doppler imaging with normal direction of blood flow towards the liver. Other: Increased echogenicity of the right kidney, partially visualized. IMPRESSION: 1. No acute abnormality identified. 2. Increased echogenicity of the right kidney again seen suggesting medical renal disease. Electronically Signed  By: Ofilia Neas M.D.   On: 11/11/2021 08:53    Labs:  CBC: Recent Labs    11/06/21 0000 11/11/21 0245 11/12/21 0542 11/13/21 0325  WBC 3.2 2.6* 7.3 6.0  HGB 8.1* 8.1* 7.1* 7.3*  HCT 24* 25.2* 22.2* 22.4*  PLT 175 146* 117* 135*    COAGS: Recent Labs    05/06/21 2010 06/19/21 2145  07/10/21 1334 11/11/21 0356  INR 0.9 1.0 1.1 1.0  APTT 25 30 36  --     BMP: Recent Labs    10/16/21 0336 10/30/21 0000 11/06/21 0000 11/11/21 0245 11/12/21 0542 11/13/21 0325  NA 137   < > 140 137 137 135  K 4.0   < > 4.9 3.8 4.2 4.0  CL 107   < > 105 106 108 107  CO2 24   < > 26* 24 23 23   GLUCOSE 119*  --   --  85 75 138*  BUN 36*   < > 53* 52* 42* 30*  CALCIUM 9.9   < > 8.9 8.9 8.8* 8.4*  CREATININE 1.84*   < > 2.2* 2.40* 2.05* 2.17*  GFRNONAA 31*  --   --  23* 27* 25*   < > = values in this interval not displayed.    LIVER FUNCTION TESTS: Recent Labs    10/16/21 0336 10/30/21 0000 11/06/21 0000 11/11/21 0245 11/12/21 0542 11/13/21 0325  BILITOT 0.4  --   --  1.4* 2.1* 1.1  AST 40   < > 86* 1,742* 579* 221*  ALT 62*   < > 229* 1,418* 935* 628*  ALKPHOS 130*   < > 274* 588* 468* 420*  PROT 5.7*  --   --  6.2* 5.5* 5.4*  ALBUMIN 2.8*   < > 3.4* 2.7* 2.2* 2.0*   < > = values in this interval not displayed.    TUMOR MARKERS: No results for input(s): AFPTM, CEA, CA199, CHROMGRNA in the last 8760 hours.  Assessment and Plan:   Elevated LFTs-- chronic Hx Chrons disease/short gut --- many lines for nutrition and access Existing Rt fem vein line--- Gaspar Cola last week +BC 11/11/21  Scheduled today for liver core biopsy Scheduled tomorrow for Rt fem vein line exchange  Risks and benefits of random liver core biopsy was discussed with the patient and/or patient's family including, but not limited to bleeding, infection, damage to adjacent structures or low yield requiring additional tests.  All of the questions were answered and there is agreement to proceed. Consent signed and in chart.   Thank you for this interesting consult.  I greatly enjoyed meeting Caitlin Peters and look forward to participating in their care.  A copy of this report was sent to the requesting provider on this date.  Electronically Signed: Lavonia Drafts, PA-C 11/13/2021,  9:38 AM   I spent a total of  30 Minutes   in face to face in clinical consultation, greater than 50% of which was counseling/coordinating care for liver core bx and right fem line excnahge

## 2021-11-13 NOTE — Plan of Care (Signed)

## 2021-11-13 NOTE — Progress Notes (Addendum)
Patient  tentatively scheduled for liver biopsy  in Interventional Radiology  today. Please  reschedule for 11.20.22 secondary IR schedule   RN  made aware   This procedure can be scheduled as OP if needed

## 2021-11-13 NOTE — Progress Notes (Signed)
Pharmacy Antibiotic Note  Caitlin Peters is a 60 y.o. female admitted on 11/11/2021 with recurrent bacteremia related to PICC line. Pharmacy has been consulted for Levaquin dosing.   AKI noted with SCr 2.17 but would expect trend down soon - normal baseline fluctuates between 1.4-1.8.   Plan: - Levaquin 750 mg IV every 48 hours for now - Will continue to follow renal function, culture results, LOT, and antibiotic de-escalation plans   Height: 5' 8"  (172.7 cm) Weight: 60 kg (132 lb 4.4 oz) IBW/kg (Calculated) : 63.9  Temp (24hrs), Avg:98.3 F (36.8 C), Min:98.2 F (36.8 C), Max:98.5 F (36.9 C)  Recent Labs  Lab 11/11/21 0245 11/12/21 0542 11/13/21 0325  WBC 2.6* 7.3 6.0  CREATININE 2.40* 2.05* 2.17*    Estimated Creatinine Clearance: 26.1 mL/min (A) (by C-G formula based on SCr of 2.17 mg/dL (H)).    Allergies  Allergen Reactions   Meperidine Hives    Other reaction(s): GI Upset Due to Chrones    Hyoscyamine Hives and Swelling    Legs swelling Disorientation   Cefepime Other (See Comments)    Neurotoxicity occurring in setting of AKI. Ceftriaxone tolerated during same admit   Gabapentin Other (See Comments)    unknown   Lyrica [Pregabalin] Other (See Comments)    Has chronic dehydration and made it worse   Topamax [Topiramate] Other (See Comments)    Has chronic dehydration and made it worse   Zosyn [Piperacillin Sod-Tazobactam So] Other (See Comments)    Patient reports it makes her vomit, her neck stiff, and her "heart feel funny" Affected kidneys   Fentanyl Rash    Pt is allergic to fentanyl patch related to the glue (gives her a rash) Pt states she is NOT allergic to fentanyl IV medicine   Morphine And Related Rash    Antimicrobials this admission: CTX 12/17 x 1 Flagy 12/17 x 1 Meropenem 12/17 x 1 Bactrim 12/18 >> 12/19 Levaquin 12/19 >>  Dose adjustments this admission:   Microbiology results: 12/17 BCx >> Stenotrophomonas maltophilia +  Pseudomonas putida  Thank you for allowing pharmacy to be a part of this patients care.  Alycia Rossetti, PharmD, BCPS Clinical Pharmacist 11/13/2021 1:59 PM   **Pharmacist phone directory can now be found on [email or web address] ([email or web address]).  Listed under [place].

## 2021-11-13 NOTE — Progress Notes (Signed)
[facility]  Date of Admission:  11/11/2021     Total days of antibiotics 3         ASSESSMENT:  [name] has polymicrobial bacteremia with recurrent Stenotrophomonas and new Pseudomonas putida. Her line was recently exchanged on 11/03/21. Discussed plan with Internal Medicine and will hold off on line exchange and treat with antibiotic lock for now.  Scheduled for liver biopsy with IR in the setting of elevated liver enzymes.  Will change antibiotics to levofloxacin. Recheck blood cultures tomorrow.  Remaining medical and supportive care per primary team.   PLAN:  Change antibiotics to levofloxacin. Hold off line exchange. Liver biopsy per IR and primary team.  Remaining medical and supportive care per primary team.   Principal Problem:   Fever Active Problems:   Crohn's disease of both small and large intestine with other complication ([facility])   Malnutrition ([facility])   Short gut syndrome   HTN (hypertension), benign   Chronic pain syndrome   On total parenteral nutrition (TPN)   Elevated liver enzymes   Stage 4 chronic kidney disease ([name])   [name]    amLODipine  10 mg Oral Daily   buPROPion ER  200 mg Oral Daily   carvedilol  12.5 mg Oral BID WC   Chlorhexidine Gluconate Cloth  6 each Topical Daily   cycloSPORINE  1 drop Both Eyes BID   DULoxetine  120 mg Oral Daily   [START ON 11/14/2021] enoxaparin (LOVENOX) injection  30 mg Subcutaneous Q24H   hydrALAZINE  100 mg Oral TID   methadone  5 mg Oral 5 X Daily   methocarbamol  500 mg Oral BID   pantoprazole (PROTONIX) IV  40 mg Intravenous Q24H   Teduglutide (rDNA)  1.5 mg Subcutaneous Daily    SUBJECTIVE:  Afebrile overnight and no acute events. Feeling better today.   Allergies  Allergen Reactions   Meperidine Hives    Other reaction(s): GI Upset Due to [place]    Hyoscyamine Hives and Swelling    Legs swelling Disorientation   Cefepime Other (See Comments)     Neurotoxicity occurring in setting of AKI. Ceftriaxone tolerated during same admit   Gabapentin Other (See Comments)    unknown   Lyrica [Pregabalin] Other (See Comments)    Has chronic dehydration and made it worse   Topamax [Topiramate] Other (See Comments)    Has chronic dehydration and made it worse   Zosyn [Piperacillin Sod-Tazobactam So] Other (See Comments)    Patient reports it makes her vomit, her neck stiff, and her "heart feel funny" Affected kidneys   Fentanyl Rash    Pt is allergic to fentanyl patch related to the glue (gives her a rash) Pt states she is NOT allergic to fentanyl IV medicine   Morphine And Related Rash     Review of Systems: Review of Systems  Constitutional:  Negative for chills, fever and weight loss.  Respiratory:  Negative for cough, shortness of breath and wheezing.   Cardiovascular:  Negative for chest pain and leg swelling.  Gastrointestinal:  Negative for abdominal pain, constipation, diarrhea, nausea and vomiting.  Skin:  Negative for rash.     OBJECTIVE: Vitals:   11/13/21 0258 11/13/21 0259 11/13/21 0610 11/13/21 0844  BP: 129/73 120/60 (!) 142/65 (!) 147/67  Pulse: 70 78 66 65  Resp: 18 18 17 20   Temp: 98.5 F (36.9 C) 98.5 F (36.9 C) 98.3 F (36.8 C) 98.2 F (36.8 C)  TempSrc:  Oral  Oral  SpO2: 98% 99% 98% 98%  Weight:      Height:       Body mass index is 20.11 kg/m.  Physical Exam Constitutional:      General: She is not in acute distress. Cardiovascular:     Rate and Rhythm: Normal rate and regular rhythm.     Heart sounds: Normal heart sounds.  Pulmonary:     Effort: Pulmonary effort is normal.     Breath sounds: Normal breath sounds.  Skin:    General: Skin is warm and dry.  Neurological:     Mental Status: She is alert and oriented to person, place, and time.  Psychiatric:        Behavior: Behavior normal.        Thought Content: Thought content normal.        Judgment: Judgment normal.    Lab  Results Lab Results  Component Value Date   WBC 6.0 11/13/2021   HGB 7.3 (L) 11/13/2021   HCT 22.4 (L) 11/13/2021   MCV 95.7 11/13/2021   PLT 135 (L) 11/13/2021    Lab Results  Component Value Date   CREATININE 2.17 (H) 11/13/2021   BUN 30 (H) 11/13/2021   NA 135 11/13/2021   K 4.0 11/13/2021   CL 107 11/13/2021   CO2 23 11/13/2021    Lab Results  Component Value Date   ALT 628 (H) 11/13/2021   AST 221 (H) 11/13/2021   ALKPHOS 420 (H) 11/13/2021   BILITOT 1.1 11/13/2021     Microbiology: Recent Results (from the past 240 hour(s))  Blood culture (routine x 2)     Status: None (Preliminary result)   Collection Time: 11/11/21  6:30 AM   Specimen: BLOOD  Result Value Ref Range Status   Specimen Description BLOOD LEFT ANTECUBITAL  Final   Special Requests   Final    BOTTLES DRAWN AEROBIC AND ANAEROBIC Blood Culture adequate volume   Culture   Final    NO GROWTH 2 DAYS Performed at [facility], [place] [address] [place], [place] [address]    Report Status PENDING  Incomplete  Blood culture (routine x 2)     Status: Abnormal (Preliminary result)   Collection Time: 11/11/21  6:35 AM   Specimen: BLOOD  Result Value Ref Range Status   Specimen Description BLOOD FEMORAL ARTERY  Final   Special Requests   Final    BOTTLES DRAWN AEROBIC AND ANAEROBIC Blood Culture adequate volume   Culture  Setup Time   Final    GRAM NEGATIVE RODS AEROBIC BOTTLE ONLY CRITICAL RESULT CALLED TO, READ BACK BY AND VERIFIED WITH: PHARMD GREG ABBOTT 11/12/21@00 :58 BY TW    Culture (A)  Final    STENOTROPHOMONAS MALTOPHILIA PSEUDOMONAS PUTIDA CULTURE REINCUBATED FOR BETTER GROWTH Performed at [facility], [place] [address], [place], [place] [address]    Report Status PENDING  Incomplete  Blood Culture ID Panel (Reflexed)     Status: Abnormal   Collection Time: 11/11/21  6:35 AM  Result Value Ref Range Status   Enterococcus faecalis NOT DETECTED NOT DETECTED Final   Enterococcus  Faecium NOT DETECTED NOT DETECTED Final   Listeria monocytogenes NOT DETECTED NOT DETECTED Final   Staphylococcus species NOT DETECTED NOT DETECTED Final   Staphylococcus aureus (BCID) NOT DETECTED NOT DETECTED Final   Staphylococcus epidermidis NOT DETECTED NOT DETECTED Final   Staphylococcus lugdunensis NOT DETECTED NOT DETECTED Final   Streptococcus species  NOT DETECTED NOT DETECTED Final   Streptococcus agalactiae NOT DETECTED NOT DETECTED Final   Streptococcus pneumoniae NOT DETECTED NOT DETECTED Final   Streptococcus pyogenes NOT DETECTED NOT DETECTED Final   A.calcoaceticus-baumannii NOT DETECTED NOT DETECTED Final   Bacteroides fragilis NOT DETECTED NOT DETECTED Final   Enterobacterales NOT DETECTED NOT DETECTED Final   Enterobacter cloacae complex NOT DETECTED NOT DETECTED Final   Escherichia coli NOT DETECTED NOT DETECTED Final   Klebsiella aerogenes NOT DETECTED NOT DETECTED Final   Klebsiella oxytoca NOT DETECTED NOT DETECTED Final   Klebsiella pneumoniae NOT DETECTED NOT DETECTED Final   Proteus species NOT DETECTED NOT DETECTED Final   Salmonella species NOT DETECTED NOT DETECTED Final   Serratia marcescens NOT DETECTED NOT DETECTED Final   Haemophilus influenzae NOT DETECTED NOT DETECTED Final   Neisseria meningitidis NOT DETECTED NOT DETECTED Final   Pseudomonas aeruginosa NOT DETECTED NOT DETECTED Final   Stenotrophomonas maltophilia DETECTED (A) NOT DETECTED Final    Comment: CRITICAL RESULT CALLED TO, READ BACK BY AND VERIFIED WITH: PHARMD GREG ABBOTT 11/12/21@00 :58 BY TW    Candida albicans NOT DETECTED NOT DETECTED Final   Candida auris NOT DETECTED NOT DETECTED Final   Candida glabrata NOT DETECTED NOT DETECTED Final   Candida krusei NOT DETECTED NOT DETECTED Final   Candida parapsilosis NOT DETECTED NOT DETECTED Final   Candida tropicalis NOT DETECTED NOT DETECTED Final   Cryptococcus neoformans/gattii NOT DETECTED NOT DETECTED Final    Comment: Performed at  [facility], [address], [place],  [address]  Resp Panel by RT-PCR (Flu A&B, Covid) Nasopharyngeal Swab     Status: None   Collection Time: 11/11/21  7:32 AM   Specimen: Nasopharyngeal Swab; Nasopharyngeal(NP) swabs in vial transport medium  Result Value Ref Range Status   SARS Coronavirus 2 by RT PCR NEGATIVE NEGATIVE Final    Comment: (NOTE) SARS-CoV-2 target nucleic acids are NOT DETECTED.  The SARS-CoV-2 RNA is generally detectable in upper respiratory specimens during the acute phase of infection. The lowest concentration of SARS-CoV-2 viral copies this assay can detect is 138 copies/mL. A negative result does not preclude SARS-Cov-2 infection and should not be used as the sole basis for treatment or other patient management decisions. A negative result may occur with  improper specimen collection/handling, submission of specimen other than nasopharyngeal swab, presence of viral mutation(s) within the areas targeted by this assay, and inadequate number of viral copies(<138 copies/mL). A negative result must be combined with clinical observations, patient history, and epidemiological information. The expected result is Negative.  Fact Sheet for Patients:  EntrepreneurPulse.com.au  Fact Sheet for Healthcare Providers:  IncredibleEmployment.be  This test is no t yet approved or cleared by the [place] FDA and  has been authorized for detection and/or diagnosis of SARS-CoV-2 by FDA under an Emergency Use Authorization (EUA). This EUA will remain  in effect (meaning this test can be used) for the duration of the COVID-19 declaration under Section 564(b)(1) of the Act, 21 U.S.C.section 360bbb-3(b)(1), unless the authorization is terminated  or revoked sooner.       Influenza A by PCR NEGATIVE NEGATIVE Final   Influenza B by PCR NEGATIVE NEGATIVE Final    Comment: (NOTE) The Xpert Xpress SARS-CoV-2/FLU/RSV plus assay is  intended as an aid in the diagnosis of influenza from Nasopharyngeal swab specimens and should not be used as a sole basis for treatment. Nasal washings and aspirates are unacceptable for Xpert Xpress SARS-CoV-2/FLU/RSV testing.  Fact  Sheet for Patients: EntrepreneurPulse.com.au  Fact Sheet for Healthcare Providers: IncredibleEmployment.be  This test is not yet approved or cleared by the [place] FDA and has been authorized for detection and/or diagnosis of SARS-CoV-2 by FDA under an Emergency Use Authorization (EUA). This EUA will remain in effect (meaning this test can be used) for the duration of the COVID-19 declaration under Section 564(b)(1) of the Act, 21 U.S.C. section 360bbb-3(b)(1), unless the authorization is terminated or revoked.  Performed at [facility], [place] [address], [place], [place] [address]      Terri Piedra, [facility]  11/13/2021  1:08 PM

## 2021-11-13 NOTE — Progress Notes (Signed)
PROGRESS NOTE    Caitlin Peters  WNI:[id number] DOB: Apr 24, 1961 DOA: 11/11/2021 PCP: [name], [name], MD   Chief Complaint  Patient presents with   Abdominal Pain  Brief Narrative/Hospital Course: Caitlin Peters, 60 y.o. female with PMH of chronic pain; Crohn's disease s/p multiple resections with short gut syndrome and severe malnutrition on TPN through R thigh PICC line; stage 4 CKD; anxiety/depression; seizure d/o; and HTN, multiple hospitalizations of line infection recent hospitalization 11/12-21, currently with end-stage IV access presented with fever abdominal pain. She saw Dr. Graylon Good on 12/6 and was doing well, had her line exchanged at [facility] earlier this week . In the ED febrile one 101.6, abnormal LFTs GI ID was consulted CT was nonspecific, patient was admitted for further management  Subjective: Seen and examined this morning No fever no chills no complaints Afebrile overnight Vitals stable LFTs downtrending  Assessment & Plan:  Stenotrophomonas maltophilia sepsis secondary to line infection Previously recurrent line infections End stage iv access on long-term TPN: Blood cultures-1/4 Stenotrophomonas maltophilia-ID following closely appreciate input Discussed with ID team bedside- since line was exchanged a week ago- holding off on exchanging, only access, it had to be exchanged as it was malfunctioning as per patient Currently not afebrile no leukocytosis.  ID planning to continue with IV antibiotics at this time and no need for line exchange again.  Acute liver dysfunction/transaminitis: Has chronically baseline elevated transaminases level in 100-200, intermittently elevated up to 1000 on few occasions.  Similar presentation LFTs downtrending significantly.  Question due to TPN, question shock liver from infection but no hypotension, risk of DI LI-on hydralazine, GI following we will discussed with hepatologist-and will benefit with liver biopsy and IR has  been consulted by GI.  Pending autoimmune serologies, imaging studies with CT scan, ultrasound liver unyielding.  Recent Labs  Lab 11/11/21 0245 11/11/21 0356 11/12/21 0542 11/13/21 0325  AST 1,742*  --  579* 221*  ALT 1,418*  --  935* 628*  ALKPHOS 588*  --  468* 420*  BILITOT 1.4*  --  2.1* 1.1  PROT 6.2*  --  5.5* 5.4*  ALBUMIN 2.7*  --  2.2* 2.0*  INR  --  1.0  --   --     Crohn's disease with multiple prior resection, short gut syndrome: Currently TPN dependent for nutrition.  Continue IV PPI, IV fluids symptomatic management as per GI  Anemia of chronic disease, baseline hemoglobin 7 gm- 8gm. 6.5 g recently in 07/11/2021  transfuse if < 7 gm,repeat H&H. Recent Labs  Lab 11/11/21 0245 11/12/21 0542 11/13/21 0325  HGB 8.1* 7.1* 7.3*  HCT 25.2* 22.2* 22.4*    Hypertension well-controlled on Norvasc Coreg hydralazine Seizure disorder continue Keppra Stage IV CKD: Overall stable renal function, monitor Recent Labs  Lab 11/11/21 0245 11/12/21 0542 11/13/21 0325  BUN 52* 42* 30*  CREATININE 2.40* 2.05* 2.17*    Pancreatic insufficiency resume Creon as able Chronic pain [place] controlled substance reporting system reviewed on admission, high risk of opioid misuse, diversion or overdose continue home Dilaudid and methadone  Severe malnutrition on TPN chronically, augment diet as tolerated.    Mood disorder continue her home Wellbutrin and Cymbalta  DVT prophylaxis: enoxaparin (LOVENOX) injection 30 mg Start: 11/14/21 1000 Code Status:   Code Status: Full Code Family Communication: plan of care discussed with patient at bedside. Status is: Inpatient Remains inpatient appropriate because: Ongoing management of liver dysfunction sepsis Disposition: Currently not medically stable for discharge. Anticipated Disposition:  hh  Objective: Vitals last 24 hrs: Vitals:   11/13/21 0258 11/13/21 0259 11/13/21 0610 11/13/21 0844  BP: 129/73 120/60 (!) 142/65 (!) 147/67  Pulse: 70 78  66 65  Resp: 18 18 17 20   Temp: 98.5 F (36.9 C) 98.5 F (36.9 C) 98.3 F (36.8 C) 98.2 F (36.8 C)  TempSrc:  Oral  Oral  SpO2: 98% 99% 98% 98%  Weight:      Height:       Weight change:   Intake/Output Summary (Last 24 hours) at 11/13/2021 1008 Last data filed at 11/13/2021 0900 Gross per 24 hour  Intake 4397.97 ml  Output 3 ml  Net 4394.97 ml   Net IO Since Admission: 7,155.04 mL [11/13/21 1008]   Physical Examination: General exam: AAOx 3, older than stated age, weak appearing. HEENT:Oral mucosa moist, Ear/Nose WNL grossly, dentition normal. Respiratory system: bilaterally clear, no use of accessory muscle Cardiovascular system: S1 & S2 +, No JVD,. Gastrointestinal system: Abdomen soft,NT,ND, BS+ Nervous System:Alert, awake, moving extremities and grossly nonfocal Extremities:No edema,distal peripheral pulses palpable.  Right femoral tunneled catheter with dressing intact no drainage or erythema. Skin:No rashes,no icterus. ALP:FXTKWI muscle bulk,tone,power   Medications reviewed:  Scheduled Meds:  amLODipine  10 mg Oral Daily   buPROPion ER  200 mg Oral Daily   carvedilol  12.5 mg Oral BID WC   Chlorhexidine Gluconate Cloth  6 each Topical Daily   cycloSPORINE  1 drop Both Eyes BID   DULoxetine  120 mg Oral Daily   [START ON 11/14/2021] enoxaparin (LOVENOX) injection  30 mg Subcutaneous Q24H   hydrALAZINE  100 mg Oral TID   methadone  5 mg Oral 5 X Daily   methocarbamol  500 mg Oral BID   pantoprazole (PROTONIX) IV  40 mg Intravenous Q24H   Teduglutide (rDNA)  1.5 mg Subcutaneous Daily   Continuous Infusions:  sodium chloride 75 mL/hr at 11/13/21 0500   levETIRAcetam Stopped (11/12/21 2238)   sulfamethoxazole-trimethoprim Stopped (11/13/21 0403)   Diet Order             Diet NPO time specified Except for: Sips with Meds  Diet effective now                          Weight change:   Wt Readings from Last 3 Encounters:  11/11/21 60 kg  10/31/21  60.5 kg  10/15/21 57.4 kg     Consultants:see note  Procedures:see note Antimicrobials: Anti-infectives (From admission, onward)    Start     Dose/Rate Route Frequency Ordered Stop   11/12/21 0200  sulfamethoxazole-trimethoprim (BACTRIM) 160 mg in dextrose 5 % 250 mL IVPB        160 mg 260 mL/hr over 60 Minutes Intravenous Every 8 hours 11/12/21 0109     11/11/21 2200  meropenem (MERREM) 1 g in sodium chloride 0.9 % 100 mL IVPB  Status:  Discontinued        1 g 200 mL/hr over 30 Minutes Intravenous Every 12 hours 11/11/21 1639 11/12/21 0107   11/11/21 0800  cefTRIAXone (ROCEPHIN) 2 g in sodium chloride 0.9 % 100 mL IVPB  Status:  Discontinued        2 g 200 mL/hr over 30 Minutes Intravenous Every 24 hours 11/11/21 0701 11/11/21 1621   11/11/21 0800  metroNIDAZOLE (FLAGYL) IVPB 500 mg  Status:  Discontinued        500 mg 100 mL/hr over 60 Minutes Intravenous  Every 12 hours 11/11/21 0701 11/11/21 1621      Culture/Microbiology    Component Value Date/Time   SDES BLOOD FEMORAL ARTERY 11/11/2021 0635   SPECREQUEST  11/11/2021 0635    BOTTLES DRAWN AEROBIC AND ANAEROBIC Blood Culture adequate volume   CULT  11/11/2021 0635    GRAM NEGATIVE RODS TOO YOUNG TO READ Performed at [facility], [place] [address], [place], [place] [address]    REPTSTATUS PENDING 11/11/2021 6389    Other culture-see note  Unresulted Labs (From admission, onward)     Start     Ordered   11/13/21 0500  Comprehensive metabolic panel  Daily,   R     Question:  Specimen collection method  Answer:  IV Team=IV Team collect   11/12/21 0759   11/13/21 0500  CBC  Daily,   R     Question:  Specimen collection method  Answer:  IV Team=IV Team collect   11/12/21 0759   11/11/21 1805  ANA w/Reflex if Positive  Once,   R        11/11/21 1805   11/11/21 1805  IgG  Once,   R        11/11/21 1805   11/11/21 1805  Anti-smooth muscle antibody, IgG  Once,   R        11/11/21 1805          Data Reviewed:  I have personally reviewed following labs and imaging studies CBC: Recent Labs  Lab 11/11/21 0245 11/12/21 0542 11/13/21 0325  WBC 2.6* 7.3 6.0  HGB 8.1* 7.1* 7.3*  HCT 25.2* 22.2* 22.4*  MCV 95.5 96.1 95.7  PLT 146* 117* 373*   Basic Metabolic Panel: Recent Labs  Lab 11/11/21 0245 11/12/21 0542 11/13/21 0325  NA 137 137 135  K 3.8 4.2 4.0  CL 106 108 107  CO2 24 23 23   GLUCOSE 85 75 138*  BUN 52* 42* 30*  CREATININE 2.40* 2.05* 2.17*  CALCIUM 8.9 8.8* 8.4*  MG  --  2.0 2.1  PHOS  --  3.5 3.4   GFR: Estimated Creatinine Clearance: 26.1 mL/min (A) (by C-G formula based on SCr of 2.17 mg/dL (H)). Liver Function Tests: Recent Labs  Lab 11/11/21 0245 11/12/21 0542 11/13/21 0325  AST 1,742* 579* 221*  ALT 1,418* 935* 628*  ALKPHOS 588* 468* 420*  BILITOT 1.4* 2.1* 1.1  PROT 6.2* 5.5* 5.4*  ALBUMIN 2.7* 2.2* 2.0*   Recent Labs  Lab 11/11/21 0245  LIPASE 25   No results for input(s): AMMONIA in the last 168 hours. Coagulation Profile: Recent Labs  Lab 11/11/21 0356  INR 1.0   Cardiac Enzymes: Recent Labs  Lab 11/12/21 0542  CKTOTAL 28*   BNP (last 3 results) No results for input(s): PROBNP in the last 8760 hours. HbA1C: No results for input(s): HGBA1C in the last 72 hours. CBG: No results for input(s): GLUCAP in the last 168 hours. Lipid Profile: Recent Labs    11/12/21 0542 11/13/21 0325  TRIG 160* 144   Thyroid Function Tests: No results for input(s): TSH, T4TOTAL, FREET4, T3FREE, THYROIDAB in the last 72 hours. Anemia Panel: No results for input(s): VITAMINB12, FOLATE, FERRITIN, TIBC, IRON, RETICCTPCT in the last 72 hours. Sepsis Labs: No results for input(s): PROCALCITON, LATICACIDVEN in the last 168 hours.  Recent Results (from the past 240 hour(s))  Blood culture (routine x 2)     Status: None (Preliminary result)   Collection Time: 11/11/21  6:30 AM  Specimen: BLOOD  Result Value Ref Range Status   Specimen Description BLOOD  LEFT ANTECUBITAL  Final   Special Requests   Final    BOTTLES DRAWN AEROBIC AND ANAEROBIC Blood Culture adequate volume   Culture   Final    NO GROWTH 2 DAYS Performed at [facility], [address] [place], [place] [address]    Report Status PENDING  Incomplete  Blood culture (routine x 2)     Status: None (Preliminary result)   Collection Time: 11/11/21  6:35 AM   Specimen: BLOOD  Result Value Ref Range Status   Specimen Description BLOOD FEMORAL ARTERY  Final   Special Requests   Final    BOTTLES DRAWN AEROBIC AND ANAEROBIC Blood Culture adequate volume   Culture  Setup Time   Final    GRAM NEGATIVE RODS AEROBIC BOTTLE ONLY CRITICAL RESULT CALLED TO, READ BACK BY AND VERIFIED WITH: PHARMD GREG ABBOTT 11/12/21@00 :62 BY TW    Culture   Final    GRAM NEGATIVE RODS TOO YOUNG TO READ Performed at [facility], [place] [address] [place], [place] [address]    Report Status PENDING  Incomplete  Blood Culture ID Panel (Reflexed)     Status: Abnormal   Collection Time: 11/11/21  6:35 AM  Result Value Ref Range Status   Enterococcus faecalis NOT DETECTED NOT DETECTED Final   Enterococcus Faecium NOT DETECTED NOT DETECTED Final   Listeria monocytogenes NOT DETECTED NOT DETECTED Final   Staphylococcus species NOT DETECTED NOT DETECTED Final   Staphylococcus aureus (BCID) NOT DETECTED NOT DETECTED Final   Staphylococcus epidermidis NOT DETECTED NOT DETECTED Final   Staphylococcus lugdunensis NOT DETECTED NOT DETECTED Final   Streptococcus species NOT DETECTED NOT DETECTED Final   Streptococcus agalactiae NOT DETECTED NOT DETECTED Final   Streptococcus pneumoniae NOT DETECTED NOT DETECTED Final   Streptococcus pyogenes NOT DETECTED NOT DETECTED Final   A.calcoaceticus-baumannii NOT DETECTED NOT DETECTED Final   Bacteroides fragilis NOT DETECTED NOT DETECTED Final   Enterobacterales NOT DETECTED NOT DETECTED Final   Enterobacter cloacae complex NOT DETECTED NOT DETECTED  Final   Escherichia coli NOT DETECTED NOT DETECTED Final   Klebsiella aerogenes NOT DETECTED NOT DETECTED Final   Klebsiella oxytoca NOT DETECTED NOT DETECTED Final   Klebsiella pneumoniae NOT DETECTED NOT DETECTED Final   Proteus species NOT DETECTED NOT DETECTED Final   Salmonella species NOT DETECTED NOT DETECTED Final   Serratia marcescens NOT DETECTED NOT DETECTED Final   Haemophilus influenzae NOT DETECTED NOT DETECTED Final   Neisseria meningitidis NOT DETECTED NOT DETECTED Final   Pseudomonas aeruginosa NOT DETECTED NOT DETECTED Final   Stenotrophomonas maltophilia DETECTED (A) NOT DETECTED Final    Comment: CRITICAL RESULT CALLED TO, READ BACK BY AND VERIFIED WITH: PHARMD GREG ABBOTT 11/12/21@00 :58 BY TW    Candida albicans NOT DETECTED NOT DETECTED Final   Candida auris NOT DETECTED NOT DETECTED Final   Candida glabrata NOT DETECTED NOT DETECTED Final   Candida krusei NOT DETECTED NOT DETECTED Final   Candida parapsilosis NOT DETECTED NOT DETECTED Final   Candida tropicalis NOT DETECTED NOT DETECTED Final   Cryptococcus neoformans/gattii NOT DETECTED NOT DETECTED Final    Comment: Performed at [facility], [place] [address], [place], [place] [address]  Resp Panel by RT-PCR (Flu A&B, Covid) Nasopharyngeal Swab     Status: None   Collection Time: 11/11/21  7:32 AM   Specimen: Nasopharyngeal Swab; Nasopharyngeal(NP) swabs in vial transport medium  Result Value Ref Range Status   SARS Coronavirus 2 by RT PCR NEGATIVE NEGATIVE Final    Comment: (NOTE) SARS-CoV-2 target nucleic acids are NOT DETECTED.  The SARS-CoV-2 RNA is generally detectable in upper respiratory specimens during the acute phase of infection. The lowest concentration of SARS-CoV-2 viral copies this assay can detect is 138 copies/mL. A negative result does not preclude SARS-Cov-2 infection and should not be used as the sole basis for treatment or other patient management decisions. A negative result may  occur with  improper specimen collection/handling, submission of specimen other than nasopharyngeal swab, presence of viral mutation(s) within the areas targeted by this assay, and inadequate number of viral copies(<138 copies/mL). A negative result must be combined with clinical observations, patient history, and epidemiological information. The expected result is Negative.  Fact Sheet for Patients:  EntrepreneurPulse.com.au  Fact Sheet for Healthcare Providers:  IncredibleEmployment.be  This test is no t yet approved or cleared by the [place] FDA and  has been authorized for detection and/or diagnosis of SARS-CoV-2 by FDA under an Emergency Use Authorization (EUA). This EUA will remain  in effect (meaning this test can be used) for the duration of the COVID-19 declaration under Section 564(b)(1) of the Act, 21 U.S.C.section 360bbb-3(b)(1), unless the authorization is terminated  or revoked sooner.       Influenza A by PCR NEGATIVE NEGATIVE Final   Influenza B by PCR NEGATIVE NEGATIVE Final    Comment: (NOTE) The Xpert Xpress SARS-CoV-2/FLU/RSV plus assay is intended as an aid in the diagnosis of influenza from Nasopharyngeal swab specimens and should not be used as a sole basis for treatment. Nasal washings and aspirates are unacceptable for Xpert Xpress SARS-CoV-2/FLU/RSV testing.  Fact Sheet for Patients: EntrepreneurPulse.com.au  Fact Sheet for Healthcare Providers: IncredibleEmployment.be  This test is not yet approved or cleared by the [place] FDA and has been authorized for detection and/or diagnosis of SARS-CoV-2 by FDA under an Emergency Use Authorization (EUA). This EUA will remain in effect (meaning this test can be used) for the duration of the COVID-19 declaration under Section 564(b)(1) of the Act, 21 U.S.C. section 360bbb-3(b)(1), unless the authorization is terminated  or revoked.  Performed at [facility], [place] [address], [place],  [address]      Radiology Studies: No results found.   LOS: 2 days   Antonieta Pert, MD [facility]  11/13/2021, 10:08 AM

## 2021-11-13 NOTE — Progress Notes (Signed)
Initial Nutrition Assessment  DOCUMENTATION CODES:   Non-severe (moderate) malnutrition in context of chronic illness  INTERVENTION:  -Ensure Enlive po BID, each supplement provides 350 kcal and 20 grams of protein -MVI with minerals daily -TPN management per Pharmacy once given the approval by ID   NUTRITION DIAGNOSIS:   Moderate Malnutrition related to chronic illness (crohn's disease s/p multiple resections with short gut syndrome) as evidenced by mild muscle depletion, mild fat depletion.  GOAL:   Patient will meet greater than or equal to 90% of their needs  MONITOR:   I & O's, Labs, PO intake, Supplement acceptance  REASON FOR ASSESSMENT:   Malnutrition Screening Tool    ASSESSMENT:   Caitlin Peters, 60 y.o. female with PMH of chronic pain; Crohn's disease s/p multiple resections with short gut syndrome and severe malnutrition on TPN through R thigh PICC line; stage 4 CKD; anxiety/depression; seizure d/o; and HTN, multiple hospitalizations of line infection recent hospitalization 11/12-21, currently with end-stage IV access presented with fever abdominal pain.  She saw Dr. Graylon Good on 12/6 and was doing well, had her line exchanged at [facility] earlier this week .  In the ED febrile one 101.6, abnormal LFTs GI ID was consulted CT was nonspecific, patient was admitted for further management  Pt with stenotrophomonas maltophilia sepsis 2/2 line infection; ID following closely and currently electing to not exchange line as pt's line was just recently exchanged a week ago and pt currently afebrile. Pt also with acute liver dysfunction/transaminitis. Pt with chronically elevated baseline transaminases. LFTs downtrending. MD questioning if due to TPN or shock liver from infection. GI following.   Per Pharmacy, TPN currently being held per ID for liver bx. Pharmacy has signed off for the time being until TPN is to be resumed and pt currently on regular diet. Note pt's  home TPN is as  follows:  [facility] -- Cycle 1800 mL over 12 hours (85 g protein, 220 g dex), 20% SMOF 60g 3x week MWF  Weight history reviewed. No significant weight changes noted.  PO Intake: 0%-90%-100% x 3 recorded meals   Medications: Scheduled Meds:  amLODipine  10 mg Oral Daily   buPROPion ER  200 mg Oral Daily   carvedilol  12.5 mg Oral BID WC   Chlorhexidine Gluconate Cloth  6 each Topical Daily   cycloSPORINE  1 drop Both Eyes BID   DULoxetine  120 mg Oral Daily   [START ON 11/14/2021] enoxaparin (LOVENOX) injection  30 mg Subcutaneous Q24H   hydrALAZINE  100 mg Oral TID   methadone  5 mg Oral 5 X Daily   methocarbamol  500 mg Oral BID   pantoprazole (PROTONIX) IV  40 mg Intravenous Q24H   Teduglutide (rDNA)  1.5 mg Subcutaneous Daily  Continuous Infusions:  sodium chloride 75 mL/hr at 11/13/21 1016   levETIRAcetam 500 mg (11/13/21 1213)   levofloxacin (LEVAQUIN) IV 750 mg (11/13/21 1517)   [START ON 11/15/2021] levofloxacin (LEVAQUIN) IV      Labs: Recent Labs  Lab 11/11/21 0245 11/12/21 0542 11/13/21 0325  NA 137 137 135  K 3.8 4.2 4.0  CL 106 108 107  CO2 24 23 23   BUN 52* 42* 30*  CREATININE 2.40* 2.05* 2.17*  CALCIUM 8.9 8.8* 8.4*  MG  --  2.0 2.1  PHOS  --  3.5 3.4  GLUCOSE 85 75 138*    NUTRITION - FOCUSED PHYSICAL EXAM:  Flowsheet Row Most Recent Value  Orbital Region Mild depletion  Upper  Arm Region Mild depletion  Thoracic and Lumbar Region Mild depletion  Buccal Region Mild depletion  Temple Region Mild depletion  Clavicle Bone Region Mild depletion  Clavicle and Acromion Bone Region Mild depletion  Scapular Bone Region No depletion  Dorsal Hand No depletion  Patellar Region Moderate depletion  Anterior Thigh Region Moderate depletion  Posterior Calf Region Moderate depletion  Edema (RD Assessment) Mild  Hair Reviewed  Eyes Reviewed  Mouth Reviewed  Skin Reviewed  Nails Reviewed       Diet Order:   Diet Order             Diet regular Room  service appropriate? Yes; Fluid consistency: Thin  Diet effective now                   EDUCATION NEEDS:   No education needs have been identified at this time  Skin:  Skin Assessment: Reviewed RN Assessment  Last BM:  12/18  Height:   Ht Readings from Last 1 Encounters:  11/11/21 5' 8"  (1.727 m)    Weight:   Wt Readings from Last 1 Encounters:  11/11/21 60 kg     BMI:  Body mass index is 20.11 kg/m.  Estimated Nutritional Needs:   Kcal:  1900-2100  Protein:  95-110 grams  Fluid:  >1.9L     Theone Stanley., MS, RD, LDN (she/her/hers) RD pager number and weekend/on-call pager number located in [place].

## 2021-11-14 ENCOUNTER — Inpatient Hospital Stay (HOSPITAL_COMMUNITY): Payer: Medicare Other

## 2021-11-14 LAB — COMPREHENSIVE METABOLIC PANEL
ALT: 452 U/L — ABNORMAL HIGH (ref 0–44)
AST: 91 U/L — ABNORMAL HIGH (ref 15–41)
Albumin: 2 g/dL — ABNORMAL LOW (ref 3.5–5.0)
Alkaline Phosphatase: 380 U/L — ABNORMAL HIGH (ref 38–126)
Anion gap: 5 (ref 5–15)
BUN: 19 mg/dL (ref 6–20)
CO2: 20 mmol/L — ABNORMAL LOW (ref 22–32)
Calcium: 8.3 mg/dL — ABNORMAL LOW (ref 8.9–10.3)
Chloride: 111 mmol/L (ref 98–111)
Creatinine, Ser: 1.88 mg/dL — ABNORMAL HIGH (ref 0.44–1.00)
GFR, Estimated: 30 mL/min — ABNORMAL LOW (ref >60)
Glucose, Bld: 94 mg/dL (ref 70–99)
Potassium: 3.8 mmol/L (ref 3.5–5.1)
Sodium: 136 mmol/L (ref 135–145)
Total Bilirubin: 1 mg/dL (ref 0.3–1.2)
Total Protein: 5.3 g/dL — ABNORMAL LOW (ref 6.5–8.1)

## 2021-11-14 LAB — CBC
HCT: 20.9 % — ABNORMAL LOW (ref 36.0–46.0)
Hemoglobin: 6.8 g/dL — CL (ref 12.0–15.0)
MCH: 30.9 pg (ref 26.0–34.0)
MCHC: 32.5 g/dL (ref 30.0–36.0)
MCV: 95 fL (ref 80.0–100.0)
Platelets: 120 10*3/uL — ABNORMAL LOW (ref 150–400)
RBC: 2.2 MIL/uL — ABNORMAL LOW (ref 3.87–5.11)
RDW: 13.2 % (ref 11.5–15.5)
WBC: 3 10*3/uL — ABNORMAL LOW (ref 4.0–10.5)
nRBC: 0 % (ref 0.0–0.2)

## 2021-11-14 LAB — PROTIME-INR
INR: 1.1 (ref 0.8–1.2)
Prothrombin Time: 14 seconds (ref 11.4–15.2)

## 2021-11-14 LAB — PREPARE RBC (CROSSMATCH)

## 2021-11-14 MED ORDER — FENTANYL CITRATE (PF) 100 MCG/2ML IJ SOLN
INTRAMUSCULAR | Status: AC
  Filled 2021-11-14: qty 2

## 2021-11-14 MED ORDER — SODIUM CHLORIDE 0.9 % IV SOLN
INTRAVENOUS | Status: AC | PRN
  Administered 2021-11-14: 10 mL/h via INTRAVENOUS

## 2021-11-14 MED ORDER — MIDAZOLAM HCL 2 MG/2ML IJ SOLN
INTRAMUSCULAR | Status: AC
  Filled 2021-11-14: qty 2

## 2021-11-14 MED ORDER — GELATIN ABSORBABLE 12-7 MM EX MISC
CUTANEOUS | Status: AC
  Filled 2021-11-14: qty 1

## 2021-11-14 MED ORDER — LIDOCAINE-EPINEPHRINE 1 %-1:100000 IJ SOLN
INTRAMUSCULAR | Status: AC
  Filled 2021-11-14: qty 1

## 2021-11-14 MED ORDER — MIDAZOLAM HCL 2 MG/2ML IJ SOLN
INTRAMUSCULAR | Status: AC | PRN
  Administered 2021-11-14 (×2): 1 mg via INTRAVENOUS

## 2021-11-14 MED ORDER — FENTANYL CITRATE (PF) 100 MCG/2ML IJ SOLN
INTRAMUSCULAR | Status: AC | PRN
  Administered 2021-11-14 (×2): 50 ug via INTRAVENOUS

## 2021-11-14 MED ORDER — ENOXAPARIN SODIUM 30 MG/0.3ML IJ SOSY
30.0000 mg | PREFILLED_SYRINGE | INTRAMUSCULAR | Status: DC
  Administered 2021-11-15: 30 mg via SUBCUTANEOUS
  Filled 2021-11-14 (×2): qty 0.3

## 2021-11-14 MED ORDER — SODIUM CHLORIDE 0.9% IV SOLUTION: Freq: Once | INTRAVENOUS | Status: AC

## 2021-11-14 NOTE — Progress Notes (Signed)
°  Critical Value: Hgb 6.8 g/dL  Name of Provider Notified: MD Alcario Drought  Orders Received Or Actions Taken?:  Yes, see new orders

## 2021-11-14 NOTE — Procedures (Signed)
Pre Procedure Dx: Elevated LFTs Post Procedural Dx: Same  Technically successful US guided biopsy of right lobe of the liver.  EBL: Trace  No immediate complications.   Ronny Bacon, MD Pager #: [phone]

## 2021-11-14 NOTE — Progress Notes (Signed)
PROGRESS NOTE    TORINA EY  PYK:[id number] DOB: May 23, 1961 DOA: 11/11/2021 PCP: [name], [name], MD   Chief Complaint  Patient presents with   Abdominal Pain  Brief Narrative/Hospital Course: [name], 60 y.o. female with PMH of chronic pain; Crohn's disease s/p multiple resections with short gut syndrome and severe malnutrition on TPN through R thigh PICC line; stage 4 CKD; anxiety/depression; seizure d/o; and HTN, multiple hospitalizations of line infection recent hospitalization 11/12-21, currently with end-stage IV access presented with fever abdominal pain. She saw Dr. Graylon Good on 12/6 and was doing well, had her line exchanged at [facility] earlier this week . In the ED febrile one 101.6, abnormal LFTs GI ID was consulted CT was nonspecific, patient was admitted for further management Blood culture is stenotrophomonas maltophilia, Pseudomonas putida  Subjective:  Resting comfortably.No new complaints. No acute events overnight, blood pressure stable  Doing well on room air, she is pleasant.    Assessment & Plan:  Steno maltophilia and Pseudomonas Partida sepsis secondary to line infection Previously recurrent line infections Chronic TPN with last iv access Blood cultures-1/4 Stenotrophomonas maltophilia,and Pseudomonas putida,ID following, antibiotic is being adjusted by ID with IV,pending culture sensitivity.  Repeat blood cultures sent 12/21 to ensure clearance.Remains hemodynamically stable.Currently no plan to change exchange line as she had it done at [facility] 1 wk PTA due to malfunction.   Acute liver dysfunction/transaminitis: Has chronically baseline elevated transaminases level in 100-200, intermittently elevated up to 1000 on few occasions.  Similar presentation LFTs improving, ?TPN, question shock liver from infection but no hypotension, risk of DILI-on hydralazine, GI following-and they are planning for liver biopsy by IR today. Pending autoimmune  serologies, imaging studies with CT scan, ultrasound liver unyielding.  Recent Labs  Lab 11/11/21 0245 11/11/21 0356 11/12/21 0542 11/13/21 0325 11/14/21 0503  AST 1,742*  --  579* 221* 91*  ALT 1,418*  --  935* 628* 452*  ALKPHOS 588*  --  468* 420* 380*  BILITOT 1.4*  --  2.1* 1.1 1.0  PROT 6.2*  --  5.5* 5.4* 5.3*  ALBUMIN 2.7*  --  2.2* 2.0* 2.0*  INR  --  1.0  --   --  1.1    Crohn's disease with multiple prior resection, short gut syndrome: Currently TPN dependent for nutrition.  Continue IV PPI, IV fluids symptomatic management as per GI.  Augment diet as tolerated per RD  Anemia of chronic disease, baseline hemoglobin 7 gm- 8gm. 6.5 g recently in 07/11/2021 dropped to 6.8 g, no obvious blood loss, transfusion started this morning repeat H&H. Transfuse if < 7 gm. Recent Labs  Lab 11/11/21 0245 11/12/21 0542 11/13/21 0325 11/14/21 0503  HGB 8.1* 7.1* 7.3* 6.8*  HCT 25.2* 22.2* 22.4* 20.9*    Hypertension: blood pressure poorly controlled this morning,on Norvasc Coreg hydralazine. Prn iv meds. Seizure disorder stable, continue Keppra Stage IV CKD: Stable renal function  Recent Labs  Lab 11/11/21 0245 11/12/21 0542 11/13/21 0325 11/14/21 0503  BUN 52* 42* 30* 19  CREATININE 2.40* 2.05* 2.17* 1.88*    Pancreatic insufficiency : cont her Creon. Chronic pain [place] controlled substance reporting system reviewed on admission, high risk of opioid misuse, diversion or overdose continue home Dilaudid and methadone  Severe malnutrition on TPN chronically, augment diet as tolerated.  RD consult  Mood disorder continue her home Wellbutrin and Cymbalta  DVT prophylaxis: enoxaparin (LOVENOX) injection 30 mg Start: 11/16/21 0800 Code Status:   Code Status: Full Code Family  Communication: plan of care discussed with patient at bedside. Status is: Inpatient Remains inpatient appropriate because: Ongoing management of liver dysfunction sepsis Disposition: Currently not medically  stable for discharge. Anticipated Disposition: hh once negative blood culture and cleared by ID  Objective: Vitals last 24 hrs: Vitals:   11/14/21 0707 11/14/21 0715 11/14/21 0846 11/14/21 0958  BP: (!) 173/74  (!) 190/74 (!) 196/82  Pulse: 66  62 64  Resp: 20  18 19   Temp: 98.1 F (36.7 C)  98.2 F (36.8 C) 98.6 F (37 C)  TempSrc: Oral  Oral Oral  SpO2: 98% 98% 98% 97%  Weight:      Height:       Weight change:   Intake/Output Summary (Last 24 hours) at 11/14/2021 1037 Last data filed at 11/14/2021 0600 Gross per 24 hour  Intake 2671.33 ml  Output 2000 ml  Net 671.33 ml   Net IO Since Admission: 6,826.37 mL [11/14/21 1037]   Physical Examination: General exam: AAOx 3 older than stated age, weak appearing. HEENT:Oral mucosa moist, Ear/Nose WNL grossly, dentition normal. Respiratory system: bilaterally clear breath sounds, no use of accessory muscle Cardiovascular system: S1 & S2 +, No JVD,. Gastrointestinal system: Abdomen soft,NT,ND, BS+ Nervous System:Alert, awake, moving extremities and grossly nonfocal Extremities: No edema, distal peripheral pulses palpable.  Skin: No rashes,no icterus. MSK: Normal muscle bulk,tone, power  Right thigh with IV line-clean.  Medications reviewed:  Scheduled Meds:  amLODipine  10 mg Oral Daily   buPROPion ER  200 mg Oral Daily   carvedilol  12.5 mg Oral BID WC   Chlorhexidine Gluconate Cloth  6 each Topical Daily   cycloSPORINE  1 drop Both Eyes BID   DULoxetine  120 mg Oral Daily   [START ON 11/16/2021] enoxaparin (LOVENOX) injection  30 mg Subcutaneous Q24H   feeding supplement  237 mL Oral BID BM   hydrALAZINE  100 mg Oral TID   lipase/protease/amylase  36,000 Units Oral TID WC   methadone  5 mg Oral 5 X Daily   methocarbamol  500 mg Oral BID   multivitamin with minerals  1 tablet Oral Daily   pantoprazole (PROTONIX) IV  40 mg Intravenous Q24H   Teduglutide (rDNA)  1.5 mg Subcutaneous Daily   Continuous Infusions:   sodium chloride 75 mL/hr at 11/14/21 0208   levETIRAcetam 500 mg (11/13/21 2153)   [START ON 11/15/2021] levofloxacin (LEVAQUIN) IV     Diet Order             Diet NPO time specified  Diet effective now                   Nutrition Problem: Moderate Malnutrition Etiology: chronic illness (crohn's disease s/p multiple resections with short gut syndrome) Signs/Symptoms: mild muscle depletion, mild fat depletion Interventions: Ensure Enlive (each supplement provides 350kcal and 20 grams of protein), MVI, Refer to RD note for recommendations  Weight change:   Wt Readings from Last 3 Encounters:  11/11/21 60 kg  10/31/21 60.5 kg  10/15/21 57.4 kg     Consultants:see note  Procedures:see note Antimicrobials: Anti-infectives (From admission, onward)    Start     Dose/Rate Route Frequency Ordered Stop   11/15/21 1000  levofloxacin (LEVAQUIN) IVPB 750 mg        750 mg 100 mL/hr over 90 Minutes Intravenous Every 48 hours 11/13/21 1357     11/13/21 1445  levofloxacin (LEVAQUIN) IVPB 750 mg        750  mg 100 mL/hr over 90 Minutes Intravenous  Once 11/13/21 1352 11/13/21 1647   11/12/21 0200  sulfamethoxazole-trimethoprim (BACTRIM) 160 mg in dextrose 5 % 250 mL IVPB  Status:  Discontinued        160 mg 260 mL/hr over 60 Minutes Intravenous Every 8 hours 11/12/21 0109 11/13/21 1352   11/11/21 2200  meropenem (MERREM) 1 g in sodium chloride 0.9 % 100 mL IVPB  Status:  Discontinued        1 g 200 mL/hr over 30 Minutes Intravenous Every 12 hours 11/11/21 1639 11/12/21 0107   11/11/21 0800  cefTRIAXone (ROCEPHIN) 2 g in sodium chloride 0.9 % 100 mL IVPB  Status:  Discontinued        2 g 200 mL/hr over 30 Minutes Intravenous Every 24 hours 11/11/21 0701 11/11/21 1621   11/11/21 0800  metroNIDAZOLE (FLAGYL) IVPB 500 mg  Status:  Discontinued        500 mg 100 mL/hr over 60 Minutes Intravenous Every 12 hours 11/11/21 0701 11/11/21 1621      Culture/Microbiology    Component Value  Date/Time   SDES BLOOD FEMORAL ARTERY 11/11/2021 0635   SPECREQUEST  11/11/2021 4967    BOTTLES DRAWN AEROBIC AND ANAEROBIC Blood Culture adequate volume   CULT (A) 11/11/2021 0635    STENOTROPHOMONAS MALTOPHILIA PSEUDOMONAS PUTIDA CULTURE REINCUBATED FOR BETTER GROWTH Performed at [facility], [place] [address] [place], [place] [address]    REPTSTATUS PENDING 11/11/2021 8466    Other culture-see note  Unresulted Labs (From admission, onward)     Start     Ordered   11/14/21 0500  Culture, blood (routine x 2)  BLOOD CULTURE X 2,   R      11/13/21 1331   11/13/21 0500  Comprehensive metabolic panel  Daily,   R     Question:  Specimen collection method  Answer:  IV Team=IV Team collect   11/12/21 0759   11/13/21 0500  CBC  Daily,   R     Question:  Specimen collection method  Answer:  IV Team=IV Team collect   11/12/21 0759          Data Reviewed: I have personally reviewed following labs and imaging studies CBC: Recent Labs  Lab 11/11/21 0245 11/12/21 0542 11/13/21 0325 11/14/21 0503  WBC 2.6* 7.3 6.0 3.0*  HGB 8.1* 7.1* 7.3* 6.8*  HCT 25.2* 22.2* 22.4* 20.9*  MCV 95.5 96.1 95.7 95.0  PLT 146* 117* 135* 599*   Basic Metabolic Panel: Recent Labs  Lab 11/11/21 0245 11/12/21 0542 11/13/21 0325 11/14/21 0503  NA 137 137 135 136  K 3.8 4.2 4.0 3.8  CL 106 108 107 111  CO2 24 23 23  20*  GLUCOSE 85 75 138* 94  BUN 52* 42* 30* 19  CREATININE 2.40* 2.05* 2.17* 1.88*  CALCIUM 8.9 8.8* 8.4* 8.3*  MG  --  2.0 2.1  --   PHOS  --  3.5 3.4  --    GFR: Estimated Creatinine Clearance: 30.1 mL/min (A) (by C-G formula based on SCr of 1.88 mg/dL (H)). Liver Function Tests: Recent Labs  Lab 11/11/21 0245 11/12/21 0542 11/13/21 0325 11/14/21 0503  AST 1,742* 579* 221* 91*  ALT 1,418* 935* 628* 452*  ALKPHOS 588* 468* 420* 380*  BILITOT 1.4* 2.1* 1.1 1.0  PROT 6.2* 5.5* 5.4* 5.3*  ALBUMIN 2.7* 2.2* 2.0* 2.0*   Recent Labs  Lab 11/11/21 0245  LIPASE 25    No results for  input(s): AMMONIA in the last 168 hours. Coagulation Profile: Recent Labs  Lab 11/11/21 0356 11/14/21 0503  INR 1.0 1.1   Cardiac Enzymes: Recent Labs  Lab 11/12/21 0542  CKTOTAL 28*   BNP (last 3 results) No results for input(s): PROBNP in the last 8760 hours. HbA1C: No results for input(s): HGBA1C in the last 72 hours. CBG: No results for input(s): GLUCAP in the last 168 hours. Lipid Profile: Recent Labs    11/12/21 0542 11/13/21 0325  TRIG 160* 144   Thyroid Function Tests: No results for input(s): TSH, T4TOTAL, FREET4, T3FREE, THYROIDAB in the last 72 hours. Anemia Panel: No results for input(s): VITAMINB12, FOLATE, FERRITIN, TIBC, IRON, RETICCTPCT in the last 72 hours. Sepsis Labs: No results for input(s): PROCALCITON, LATICACIDVEN in the last 168 hours.  Recent Results (from the past 240 hour(s))  Blood culture (routine x 2)     Status: None (Preliminary result)   Collection Time: 11/11/21  6:30 AM   Specimen: BLOOD  Result Value Ref Range Status   Specimen Description BLOOD LEFT ANTECUBITAL  Final   Special Requests   Final    BOTTLES DRAWN AEROBIC AND ANAEROBIC Blood Culture adequate volume   Culture   Final    NO GROWTH 3 DAYS Performed at [facility], [address], [place], [place] [address]    Report Status PENDING  Incomplete  Blood culture (routine x 2)     Status: Abnormal (Preliminary result)   Collection Time: 11/11/21  6:35 AM   Specimen: BLOOD  Result Value Ref Range Status   Specimen Description BLOOD FEMORAL ARTERY  Final   Special Requests   Final    BOTTLES DRAWN AEROBIC AND ANAEROBIC Blood Culture adequate volume   Culture  Setup Time   Final    GRAM NEGATIVE RODS AEROBIC BOTTLE ONLY CRITICAL RESULT CALLED TO, READ BACK BY AND VERIFIED WITH: PHARMD GREG ABBOTT 11/12/21@00 :58 BY TW    Culture (A)  Final    STENOTROPHOMONAS MALTOPHILIA PSEUDOMONAS PUTIDA CULTURE REINCUBATED FOR BETTER GROWTH Performed at  [facility], [place] [address] [place], [place] [address]    Report Status PENDING  Incomplete  Blood Culture ID Panel (Reflexed)     Status: Abnormal   Collection Time: 11/11/21  6:35 AM  Result Value Ref Range Status   Enterococcus faecalis NOT DETECTED NOT DETECTED Final   Enterococcus Faecium NOT DETECTED NOT DETECTED Final   Listeria monocytogenes NOT DETECTED NOT DETECTED Final   Staphylococcus species NOT DETECTED NOT DETECTED Final   Staphylococcus aureus (BCID) NOT DETECTED NOT DETECTED Final   Staphylococcus epidermidis NOT DETECTED NOT DETECTED Final   Staphylococcus lugdunensis NOT DETECTED NOT DETECTED Final   Streptococcus species NOT DETECTED NOT DETECTED Final   Streptococcus agalactiae NOT DETECTED NOT DETECTED Final   Streptococcus pneumoniae NOT DETECTED NOT DETECTED Final   Streptococcus pyogenes NOT DETECTED NOT DETECTED Final   A.calcoaceticus-baumannii NOT DETECTED NOT DETECTED Final   Bacteroides fragilis NOT DETECTED NOT DETECTED Final   Enterobacterales NOT DETECTED NOT DETECTED Final   Enterobacter cloacae complex NOT DETECTED NOT DETECTED Final   Escherichia coli NOT DETECTED NOT DETECTED Final   Klebsiella aerogenes NOT DETECTED NOT DETECTED Final   Klebsiella oxytoca NOT DETECTED NOT DETECTED Final   Klebsiella pneumoniae NOT DETECTED NOT DETECTED Final   Proteus species NOT DETECTED NOT DETECTED Final   Salmonella species NOT DETECTED NOT DETECTED Final   Serratia marcescens NOT DETECTED NOT DETECTED Final   Haemophilus  influenzae NOT DETECTED NOT DETECTED Final   Neisseria meningitidis NOT DETECTED NOT DETECTED Final   Pseudomonas aeruginosa NOT DETECTED NOT DETECTED Final   Stenotrophomonas maltophilia DETECTED (A) NOT DETECTED Final    Comment: CRITICAL RESULT CALLED TO, READ BACK BY AND VERIFIED WITH: PHARMD GREG ABBOTT 11/12/21@00 :58 BY TW    Candida albicans NOT DETECTED NOT DETECTED Final   Candida auris NOT DETECTED NOT DETECTED Final    Candida glabrata NOT DETECTED NOT DETECTED Final   Candida krusei NOT DETECTED NOT DETECTED Final   Candida parapsilosis NOT DETECTED NOT DETECTED Final   Candida tropicalis NOT DETECTED NOT DETECTED Final   Cryptococcus neoformans/gattii NOT DETECTED NOT DETECTED Final    Comment: Performed at [facility], [place] [address] [place], [place] [address]  Resp Panel by RT-PCR (Flu A&B, Covid) Nasopharyngeal Swab     Status: None   Collection Time: 11/11/21  7:32 AM   Specimen: Nasopharyngeal Swab; Nasopharyngeal(NP) swabs in vial transport medium  Result Value Ref Range Status   SARS Coronavirus 2 by RT PCR NEGATIVE NEGATIVE Final    Comment: (NOTE) SARS-CoV-2 target nucleic acids are NOT DETECTED.  The SARS-CoV-2 RNA is generally detectable in upper respiratory specimens during the acute phase of infection. The lowest concentration of SARS-CoV-2 viral copies this assay can detect is 138 copies/mL. A negative result does not preclude SARS-Cov-2 infection and should not be used as the sole basis for treatment or other patient management decisions. A negative result may occur with  improper specimen collection/handling, submission of specimen other than nasopharyngeal swab, presence of viral mutation(s) within the areas targeted by this assay, and inadequate number of viral copies(<138 copies/mL). A negative result must be combined with clinical observations, patient history, and epidemiological information. The expected result is Negative.  Fact Sheet for Patients:  EntrepreneurPulse.com.au  Fact Sheet for Healthcare Providers:  IncredibleEmployment.be  This test is no t yet approved or cleared by the [place] FDA and  has been authorized for detection and/or diagnosis of SARS-CoV-2 by FDA under an Emergency Use Authorization (EUA). This EUA will remain  in effect (meaning this test can be used) for the duration of the COVID-19 declaration  under Section 564(b)(1) of the Act, 21 U.S.C.section 360bbb-3(b)(1), unless the authorization is terminated  or revoked sooner.       Influenza A by PCR NEGATIVE NEGATIVE Final   Influenza B by PCR NEGATIVE NEGATIVE Final    Comment: (NOTE) The Xpert Xpress SARS-CoV-2/FLU/RSV plus assay is intended as an aid in the diagnosis of influenza from Nasopharyngeal swab specimens and should not be used as a sole basis for treatment. Nasal washings and aspirates are unacceptable for Xpert Xpress SARS-CoV-2/FLU/RSV testing.  Fact Sheet for Patients: EntrepreneurPulse.com.au  Fact Sheet for Healthcare Providers: IncredibleEmployment.be  This test is not yet approved or cleared by the [place] FDA and has been authorized for detection and/or diagnosis of SARS-CoV-2 by FDA under an Emergency Use Authorization (EUA). This EUA will remain in effect (meaning this test can be used) for the duration of the COVID-19 declaration under Section 564(b)(1) of the Act, 21 U.S.C. section 360bbb-3(b)(1), unless the authorization is terminated or revoked.  Performed at [facility], [place] [address] [place], [place] [address]      Radiology Studies: No results found.   LOS: 3 days   Antonieta Pert, MD [facility]  11/14/2021, 10:37 AM

## 2021-11-14 NOTE — Plan of Care (Signed)
  Problem: Education: Goal: Knowledge of General Education information will improve Description Including pain rating scale, medication(s)/side effects and non-pharmacologic comfort measures Outcome: Progressing   Problem: Health Behavior/Discharge Planning: Goal: Ability to manage health-related needs will improve Outcome: Progressing   

## 2021-11-14 NOTE — Care Management Important Message (Signed)
Important Message  Patient Details  Name: Caitlin Peters MRN: [id number] Date of Birth: [birth year]   Medicare Important Message Given:  Yes     Cleon Signorelli 11/14/2021, 3:05 PM

## 2021-11-15 DIAGNOSIS — T80211D Bloodstream infection due to central venous catheter, subsequent encounter: Secondary | ICD-10-CM

## 2021-11-15 DIAGNOSIS — R7989 Other specified abnormal findings of blood chemistry: Secondary | ICD-10-CM

## 2021-11-15 LAB — BPAM RBC
Blood Product Expiration Date: 202212272359
ISSUE DATE / TIME: 202212200648
Unit Type and Rh: 5100

## 2021-11-15 LAB — CBC
HCT: 27.9 % — ABNORMAL LOW (ref 36.0–46.0)
Hemoglobin: 9.4 g/dL — ABNORMAL LOW (ref 12.0–15.0)
MCH: 31.1 pg (ref 26.0–34.0)
MCHC: 33.7 g/dL (ref 30.0–36.0)
MCV: 92.4 fL (ref 80.0–100.0)
Platelets: 169 10*3/uL (ref 150–400)
RBC: 3.02 MIL/uL — ABNORMAL LOW (ref 3.87–5.11)
RDW: 13.2 % (ref 11.5–15.5)
WBC: 4.9 10*3/uL (ref 4.0–10.5)
nRBC: 0 % (ref 0.0–0.2)

## 2021-11-15 LAB — TYPE AND SCREEN
ABO/RH(D): O POS
Antibody Screen: NEGATIVE
Unit division: 0

## 2021-11-15 LAB — COMPREHENSIVE METABOLIC PANEL
ALT: 331 U/L — ABNORMAL HIGH (ref 0–44)
AST: 52 U/L — ABNORMAL HIGH (ref 15–41)
Albumin: 2.3 g/dL — ABNORMAL LOW (ref 3.5–5.0)
Alkaline Phosphatase: 414 U/L — ABNORMAL HIGH (ref 38–126)
Anion gap: 6 (ref 5–15)
BUN: 17 mg/dL (ref 6–20)
CO2: 20 mmol/L — ABNORMAL LOW (ref 22–32)
Calcium: 8.9 mg/dL (ref 8.9–10.3)
Chloride: 109 mmol/L (ref 98–111)
Creatinine, Ser: 1.73 mg/dL — ABNORMAL HIGH (ref 0.44–1.00)
GFR, Estimated: 33 mL/min — ABNORMAL LOW (ref >60)
Glucose, Bld: 99 mg/dL (ref 70–99)
Potassium: 3.6 mmol/L (ref 3.5–5.1)
Sodium: 135 mmol/L (ref 135–145)
Total Bilirubin: 1.2 mg/dL (ref 0.3–1.2)
Total Protein: 5.8 g/dL — ABNORMAL LOW (ref 6.5–8.1)

## 2021-11-15 LAB — CULTURE, BLOOD (ROUTINE X 2): Special Requests: ADEQUATE

## 2021-11-15 MED ORDER — POTASSIUM CHLORIDE CRYS ER 20 MEQ PO TBCR
40.0000 meq | EXTENDED_RELEASE_TABLET | Freq: Once | ORAL | Status: AC
  Administered 2021-11-15: 40 meq via ORAL
  Filled 2021-11-15: qty 2

## 2021-11-15 NOTE — Progress Notes (Signed)
PHARMACY CONSULT NOTE FOR:  OUTPATIENT  PARENTERAL ANTIBIOTIC THERAPY (OPAT)  Indication: Stenotrophomonas + Pseudomonas PICC line infection Regimen: Levaquin 500 mg IV every 24 hours and Ciprofloxacin intracatheter lock solution instilled in each lumen of double lumen catheter as lock therapy for at least 2 hours/daily  End date: 11/27/21  IV antibiotic discharge orders are pended. To discharging provider:  please sign these orders via discharge navigator,  Select New Orders & click on the button choice - Manage This Unsigned Work.    Thank you for allowing pharmacy to be a part of this patients care.  Alycia Rossetti, PharmD, BCPS Clinical Pharmacist 11/15/2021 1:41 PM   **Pharmacist phone directory can now be found on [email or web address] ([email or web address]).  Listed under [place].

## 2021-11-15 NOTE — Progress Notes (Signed)
PROGRESS NOTE    Caitlin Peters  CNO:[id number] DOB: 12/19/1960 DOA: 11/11/2021  PCP: [name], [name], MD    Brief Narrative/Hospital Course: Caitlin Peters, 60 y.o. female with PMH of chronic pain; Crohn's disease s/p multiple resections with short gut syndrome and severe malnutrition on TPN through R thigh PICC line; stage 4 CKD; anxiety/depression; seizure d/o; and HTN, multiple hospitalizations of line infection recent hospitalization 11/12-21, currently with end-stage IV access presented with fever abdominal pain. She saw Dr. Baxter Flattery on 12/6 and was doing well, had her line exchanged at [facility] earlier this week . In the ED she was febrile to 101.6, abnormal LFTs. GI ID was consulted. CT was nonspecific, patient was admitted for further management. Blood culture grew stenotrophomonas maltophilia, Pseudomonas putida   Subjective: Patient mentions that she is feeling stronger this morning.  Denies any shortness of breath.  Has chronic pain from her Crohn's disease.  No nausea vomiting.  Assessment & Plan:  Stenotrophomonas maltophilia and Pseudomonas Partida sepsis secondary to line infection Previously recurrent line infections Chronic TPN with last iv access Blood cultures-1/4 Stenotrophomonas maltophilia,and Pseudomonas putida. Infectious diseases following.  This is a difficult situation because she very limited IV access. Blood cultures were repeated on 12/21.  Currently there is no plan to exchange the line as she had it done at [facility] 1 week prior to admission due to malfunction. She is currently noted to be on levofloxacin.  Looks like previously she was on ceftazidime as well as Bactrim. Waiting for final infectious disease recommendations  Remains off of TPN.  Acute liver dysfunction/transaminitis Has chronically baseline elevated transaminases level in 100-200, intermittently elevated up to 1000 on few occasions.   Gastroenterology was consulted due to  significant rise in LFTs.  Differential diagnoses include secondary to TPN, question shock liver, drug-induced liver injury.  Liver biopsy on 12/20 by interventional radiology. LFTs have been improving.  Results of the biopsy to be followed by gastroenterology.    Crohn's disease with multiple prior resection, short gut syndrome:  Currently TPN dependent for nutrition.  Continue IV PPI, IV fluids. Augment diet as tolerated per RD  Anemia of chronic disease Baseline hemoglobin 7 - 8gm.  Noted to have a hemoglobin of 6.8.  Transfused PRBC on 12/20.  Improved to 9.4 today.   No overt blood losses noted.  Essential hypertension Blood pressure remains elevated.  Patient currently on amlodipine, carvedilol, hydralazine.  Monitor closely.  May need to add additional agents.   Seizure disorder Stable, continue Keppra  Stage IV CKD Baseline creatinine around 2.0.  Noted to be stable for the most part.  Monitor urine output.  Pancreatic insufficiency Continue Creon.  Chronic pain  Continue home Dilaudid and methadone  Moderate protein calorie malnutrition  On TPN chronically, augment diet as tolerated.  RD consult  Mood disorder  Continue her home Wellbutrin and Cymbalta  DVT prophylaxis: enoxaparin (LOVENOX) injection 30 mg Start: 11/16/21 0800 Code Status:   Code Status: Full Code Family Communication: plan of care discussed with patient at bedside. Disposition: Hopefully return home when improved.  Status is: Inpatient  Remains inpatient appropriate because: IV antibiotics        Objective: Vitals last 24 hrs: Vitals:   11/14/21 1214 11/14/21 1638 11/14/21 2032 11/15/21 0445  BP: (!) 153/66 (!) 169/77 (!) 181/78 (!) 169/68  Pulse: 66 70 73 75  Resp: 19 20 18 18   Temp: 98.1 F (36.7 C) 98.1 F (36.7 C) 98.5 F (36.9 C)  98.1 F (36.7 C)  TempSrc: Oral Oral Oral Oral  SpO2: 98% 98% 98% 99%  Weight:   64.5 kg   Height:       Weight change:   Intake/Output  Summary (Last 24 hours) at 11/15/2021 0909 Last data filed at 11/15/2021 0600 Gross per 24 hour  Intake 1199.66 ml  Output 900 ml  Net 299.66 ml    Net IO Since Admission: 7,126.03 mL [11/15/21 0909]    Physical Examination:  General appearance: Awake alert.  In no distress Resp: Clear to auscultation bilaterally.  Normal effort Cardio: S1-S2 is normal regular.  No S3-S4.  No rubs murmurs or bruit GI: Abdomen is soft.  No masses organomegaly.  Mildly tender which is chronic per patient Extremities: No edema.  Full range of motion of lower extremities. Neurologic: Alert and oriented x3.  No focal neurological deficits.  PICC Line noted in the right thigh area.    Medications reviewed:  Scheduled Meds:  amLODipine  10 mg Oral Daily   buPROPion ER  200 mg Oral Daily   carvedilol  12.5 mg Oral BID WC   Chlorhexidine Gluconate Cloth  6 each Topical Daily   cycloSPORINE  1 drop Both Eyes BID   DULoxetine  120 mg Oral Daily   [START ON 11/16/2021] enoxaparin (LOVENOX) injection  30 mg Subcutaneous Q24H   feeding supplement  237 mL Oral BID BM   hydrALAZINE  100 mg Oral TID   lipase/protease/amylase  36,000 Units Oral TID WC   methadone  5 mg Oral 5 X Daily   methocarbamol  500 mg Oral BID   multivitamin with minerals  1 tablet Oral Daily   pantoprazole (PROTONIX) IV  40 mg Intravenous Q24H   Teduglutide (rDNA)  1.5 mg Subcutaneous Daily   Continuous Infusions:  sodium chloride 75 mL/hr at 11/14/21 2025   levETIRAcetam 500 mg (11/15/21 0905)   levofloxacin (LEVAQUIN) IV     Diet Order             Diet regular Room service appropriate? Yes; Fluid consistency: Thin  Diet effective now                   Nutrition Problem: Moderate Malnutrition Etiology: chronic illness (crohn's disease s/p multiple resections with short gut syndrome) Signs/Symptoms: mild muscle depletion, mild fat depletion Interventions: Ensure Enlive (each supplement provides 350kcal and 20 grams of  protein), MVI, Refer to RD note for recommendations  Weight change:   Wt Readings from Last 3 Encounters:  11/14/21 64.5 kg  10/31/21 60.5 kg  10/15/21 57.4 kg     Consultants:see note  Procedures:see note Antimicrobials: Anti-infectives (From admission, onward)    Start     Dose/Rate Route Frequency Ordered Stop   11/15/21 1000  levofloxacin (LEVAQUIN) IVPB 750 mg        750 mg 100 mL/hr over 90 Minutes Intravenous Every 48 hours 11/13/21 1357     11/13/21 1445  levofloxacin (LEVAQUIN) IVPB 750 mg        750 mg 100 mL/hr over 90 Minutes Intravenous  Once 11/13/21 1352 11/14/21 1935   11/12/21 0200  sulfamethoxazole-trimethoprim (BACTRIM) 160 mg in dextrose 5 % 250 mL IVPB  Status:  Discontinued        160 mg 260 mL/hr over 60 Minutes Intravenous Every 8 hours 11/12/21 0109 11/13/21 1352   11/11/21 2200  meropenem (MERREM) 1 g in sodium chloride 0.9 % 100 mL IVPB  Status:  Discontinued  1 g 200 mL/hr over 30 Minutes Intravenous Every 12 hours 11/11/21 1639 11/12/21 0107   11/11/21 0800  cefTRIAXone (ROCEPHIN) 2 g in sodium chloride 0.9 % 100 mL IVPB  Status:  Discontinued        2 g 200 mL/hr over 30 Minutes Intravenous Every 24 hours 11/11/21 0701 11/11/21 1621   11/11/21 0800  metroNIDAZOLE (FLAGYL) IVPB 500 mg  Status:  Discontinued        500 mg 100 mL/hr over 60 Minutes Intravenous Every 12 hours 11/11/21 0701 11/11/21 1621      Culture/Microbiology    Component Value Date/Time   SDES BLOOD FEMORAL ARTERY 11/11/2021 0635   SPECREQUEST  11/11/2021 0635    BOTTLES DRAWN AEROBIC AND ANAEROBIC Blood Culture adequate volume   CULT STENOTROPHOMONAS MALTOPHILIA PSEUDOMONAS PUTIDA  (A) 11/11/2021 0635   REPTSTATUS 11/15/2021 FINAL 11/11/2021 8527    Other culture-see note  Unresulted Labs (From admission, onward)     Start     Ordered   11/14/21 0500  Culture, blood (routine x 2)  BLOOD CULTURE X 2,   R      11/13/21 1331            Data Reviewed: I  have personally reviewed following labs and imaging studies CBC: Recent Labs  Lab 11/11/21 0245 11/12/21 0542 11/13/21 0325 11/14/21 0503 11/15/21 0435  WBC 2.6* 7.3 6.0 3.0* 4.9  HGB 8.1* 7.1* 7.3* 6.8* 9.4*  HCT 25.2* 22.2* 22.4* 20.9* 27.9*  MCV 95.5 96.1 95.7 95.0 92.4  PLT 146* 117* 135* 120* 782    Basic Metabolic Panel: Recent Labs  Lab 11/11/21 0245 11/12/21 0542 11/13/21 0325 11/14/21 0503 11/15/21 0435  NA 137 137 135 136 135  K 3.8 4.2 4.0 3.8 3.6  CL 106 108 107 111 109  CO2 24 23 23  20* 20*  GLUCOSE 85 75 138* 94 99  BUN 52* 42* 30* 19 17  CREATININE 2.40* 2.05* 2.17* 1.88* 1.73*  CALCIUM 8.9 8.8* 8.4* 8.3* 8.9  MG  --  2.0 2.1  --   --   PHOS  --  3.5 3.4  --   --     GFR: Estimated Creatinine Clearance: 34.9 mL/min (A) (by C-G formula based on SCr of 1.73 mg/dL (H)). Liver Function Tests: Recent Labs  Lab 11/11/21 0245 11/12/21 0542 11/13/21 0325 11/14/21 0503 11/15/21 0435  AST 1,742* 579* 221* 91* 52*  ALT 1,418* 935* 628* 452* 331*  ALKPHOS 588* 468* 420* 380* 414*  BILITOT 1.4* 2.1* 1.1 1.0 1.2  PROT 6.2* 5.5* 5.4* 5.3* 5.8*  ALBUMIN 2.7* 2.2* 2.0* 2.0* 2.3*    Recent Labs  Lab 11/11/21 0245  LIPASE 25     Coagulation Profile: Recent Labs  Lab 11/11/21 0356 11/14/21 0503  INR 1.0 1.1    Cardiac Enzymes: Recent Labs  Lab 11/12/21 0542  CKTOTAL 28*    Lipid Profile: Recent Labs    11/13/21 0325  TRIG 144     Recent Results (from the past 240 hour(s))  Blood culture (routine x 2)     Status: None (Preliminary result)   Collection Time: 11/11/21  6:30 AM   Specimen: BLOOD  Result Value Ref Range Status   Specimen Description BLOOD LEFT ANTECUBITAL  Final   Special Requests   Final    BOTTLES DRAWN AEROBIC AND ANAEROBIC Blood Culture adequate volume   Culture   Final    NO GROWTH 3 DAYS Performed at [facility],  [address], [place], [place] [address]    Report Status PENDING  Incomplete  Blood  culture (routine x 2)     Status: Abnormal   Collection Time: 11/11/21  6:35 AM   Specimen: BLOOD  Result Value Ref Range Status   Specimen Description BLOOD FEMORAL ARTERY  Final   Special Requests   Final    BOTTLES DRAWN AEROBIC AND ANAEROBIC Blood Culture adequate volume   Culture  Setup Time   Final    GRAM NEGATIVE RODS AEROBIC BOTTLE ONLY CRITICAL RESULT CALLED TO, READ BACK BY AND VERIFIED WITH: PHARMD GREG ABBOTT 11/12/21@00 :58 BY TW Performed at [facility], [place] [address], [place], [place] [address]    Culture STENOTROPHOMONAS MALTOPHILIA PSEUDOMONAS PUTIDA  (A)  Final   Report Status 11/15/2021 FINAL  Final   Organism ID, Bacteria STENOTROPHOMONAS MALTOPHILIA  Final   Organism ID, Bacteria PSEUDOMONAS PUTIDA  Final      Susceptibility   Pseudomonas putida - MIC*    CEFTAZIDIME 4 SENSITIVE Sensitive     CIPROFLOXACIN <=0.25 SENSITIVE Sensitive     GENTAMICIN <=1 SENSITIVE Sensitive     IMIPENEM 1 SENSITIVE Sensitive     PIP/TAZO 8 SENSITIVE Sensitive     * PSEUDOMONAS PUTIDA   Stenotrophomonas maltophilia - MIC*    LEVOFLOXACIN 0.25 SENSITIVE Sensitive     TRIMETH/SULFA <=20 SENSITIVE Sensitive     * STENOTROPHOMONAS MALTOPHILIA  Blood Culture ID Panel (Reflexed)     Status: Abnormal   Collection Time: 11/11/21  6:35 AM  Result Value Ref Range Status   Enterococcus faecalis NOT DETECTED NOT DETECTED Final   Enterococcus Faecium NOT DETECTED NOT DETECTED Final   Listeria monocytogenes NOT DETECTED NOT DETECTED Final   Staphylococcus species NOT DETECTED NOT DETECTED Final   Staphylococcus aureus (BCID) NOT DETECTED NOT DETECTED Final   Staphylococcus epidermidis NOT DETECTED NOT DETECTED Final   Staphylococcus lugdunensis NOT DETECTED NOT DETECTED Final   Streptococcus species NOT DETECTED NOT DETECTED Final   Streptococcus agalactiae NOT DETECTED NOT DETECTED Final   Streptococcus pneumoniae NOT DETECTED NOT DETECTED Final   Streptococcus pyogenes NOT  DETECTED NOT DETECTED Final   A.calcoaceticus-baumannii NOT DETECTED NOT DETECTED Final   Bacteroides fragilis NOT DETECTED NOT DETECTED Final   Enterobacterales NOT DETECTED NOT DETECTED Final   Enterobacter cloacae complex NOT DETECTED NOT DETECTED Final   Escherichia coli NOT DETECTED NOT DETECTED Final   Klebsiella aerogenes NOT DETECTED NOT DETECTED Final   Klebsiella oxytoca NOT DETECTED NOT DETECTED Final   Klebsiella pneumoniae NOT DETECTED NOT DETECTED Final   Proteus species NOT DETECTED NOT DETECTED Final   Salmonella species NOT DETECTED NOT DETECTED Final   Serratia marcescens NOT DETECTED NOT DETECTED Final   Haemophilus influenzae NOT DETECTED NOT DETECTED Final   Neisseria meningitidis NOT DETECTED NOT DETECTED Final   Pseudomonas aeruginosa NOT DETECTED NOT DETECTED Final   Stenotrophomonas maltophilia DETECTED (A) NOT DETECTED Final    Comment: CRITICAL RESULT CALLED TO, READ BACK BY AND VERIFIED WITH: PHARMD GREG ABBOTT 11/12/21@00 :58 BY TW    Candida albicans NOT DETECTED NOT DETECTED Final   Candida auris NOT DETECTED NOT DETECTED Final   Candida glabrata NOT DETECTED NOT DETECTED Final   Candida krusei NOT DETECTED NOT DETECTED Final   Candida parapsilosis NOT DETECTED NOT DETECTED Final   Candida tropicalis NOT DETECTED NOT DETECTED Final   Cryptococcus neoformans/gattii NOT DETECTED NOT DETECTED Final    Comment: Performed at [facility],  [address], [place], [place] [address]  Resp Panel by RT-PCR (Flu A&B, Covid) Nasopharyngeal Swab     Status: None   Collection Time: 11/11/21  7:32 AM   Specimen: Nasopharyngeal Swab; Nasopharyngeal(NP) swabs in vial transport medium  Result Value Ref Range Status   SARS Coronavirus 2 by RT PCR NEGATIVE NEGATIVE Final    Comment: (NOTE) SARS-CoV-2 target nucleic acids are NOT DETECTED.  The SARS-CoV-2 RNA is generally detectable in upper respiratory specimens during the acute phase of infection. The  lowest concentration of SARS-CoV-2 viral copies this assay can detect is 138 copies/mL. A negative result does not preclude SARS-Cov-2 infection and should not be used as the sole basis for treatment or other patient management decisions. A negative result may occur with  improper specimen collection/handling, submission of specimen other than nasopharyngeal swab, presence of viral mutation(s) within the areas targeted by this assay, and inadequate number of viral copies(<138 copies/mL). A negative result must be combined with clinical observations, patient history, and epidemiological information. The expected result is Negative.  Fact Sheet for Patients:  EntrepreneurPulse.com.au  Fact Sheet for Healthcare Providers:  IncredibleEmployment.be  This test is no t yet approved or cleared by the [place] FDA and  has been authorized for detection and/or diagnosis of SARS-CoV-2 by FDA under an Emergency Use Authorization (EUA). This EUA will remain  in effect (meaning this test can be used) for the duration of the COVID-19 declaration under Section 564(b)(1) of the Act, 21 U.S.C.section 360bbb-3(b)(1), unless the authorization is terminated  or revoked sooner.       Influenza A by PCR NEGATIVE NEGATIVE Final   Influenza B by PCR NEGATIVE NEGATIVE Final    Comment: (NOTE) The Xpert Xpress SARS-CoV-2/FLU/RSV plus assay is intended as an aid in the diagnosis of influenza from Nasopharyngeal swab specimens and should not be used as a sole basis for treatment. Nasal washings and aspirates are unacceptable for Xpert Xpress SARS-CoV-2/FLU/RSV testing.  Fact Sheet for Patients: EntrepreneurPulse.com.au  Fact Sheet for Healthcare Providers: IncredibleEmployment.be  This test is not yet approved or cleared by the [place] FDA and has been authorized for detection and/or diagnosis of SARS-CoV-2 by FDA under  an Emergency Use Authorization (EUA). This EUA will remain in effect (meaning this test can be used) for the duration of the COVID-19 declaration under Section 564(b)(1) of the Act, 21 U.S.C. section 360bbb-3(b)(1), unless the authorization is terminated or revoked.  Performed at [facility], [place] [address] [place], [place] [address]       Radiology Studies: US BIOPSY (LIVER)  Result Date: 11/14/2021 INDICATION: Elevated LFTs of uncertain etiology. Please perform ultrasound-guided liver biopsy for tissue diagnostic purposes. EXAM: ULTRASOUND GUIDED LIVER BIOPSY COMPARISON:  Right upper quadrant abdominal ultrasound-11/11/2021; CT abdomen and pelvis-11/11/2021 MEDICATIONS: None ANESTHESIA/SEDATION: Moderate (conscious) sedation was employed during this procedure as administered by the Interventional Radiology RN. A total of Versed 2 mg and Fentanyl 100 mcg was administered intravenously. Moderate Sedation Time: 12 minutes. [name] level of consciousness and vital signs were monitored continuously by radiology nursing throughout the procedure under my direct supervision. COMPLICATIONS: None immediate. PROCEDURE: Informed written consent was obtained from the patient after a discussion of the risks, benefits and alternatives to treatment. The patient understands and consents the procedure. A timeout was performed prior to the initiation of the procedure. Ultrasound scanning was performed of the right upper abdominal quadrant and the procedure was planned. The right upper abdomen was prepped and draped  in the usual sterile fashion. The overlying soft tissues were anesthetized with 1% lidocaine with epinephrine. A 17 gauge, 6.8 cm co-axial needle was advanced into a peripheral aspect of the right lobe of the liver and 3 core biopsies were obtained with an 18 gauge core device under direct ultrasound guidance. The co-axial needle track was embolized with the administration of a Gel-Foam  slurry. Superficial hemostasis was obtained with manual compression. Post procedural scanning was negative for definitive area of hemorrhage. A dressing was placed. The patient tolerated the procedure well without immediate post procedural complication. IMPRESSION: Technically successful ultrasound guided liver biopsy. Electronically Signed   By: Sandi Mariscal M.D.   On: 11/14/2021 13:10     LOS: 4 days   Bonnielee Haff, MD [facility]  11/15/2021, 9:09 AM

## 2021-11-15 NOTE — Progress Notes (Signed)
Pharmacy Antibiotic Note  Caitlin Peters is a 60 y.o. female admitted on 11/11/2021 with recurrent bacteremia related to PICC line. Pharmacy has been consulted for Levaquin dosing.   AKI noted with SCr down to 1.51 and appears to be at baseline since normal baseline fluctuates between 1.4-1.8. Will adjust Levaquin dose to be more aggressive given Pseudomonas and patient's history of Acinetobacter. Daily dose should be remain appropriate for discharge OPAT antibiotics.   Plan: - Adjust Levaquin to 500 mg IV every 24 hours - Will continue to follow renal function, culture results, LOT, and antibiotic de-escalation plans   Height: 5' 8"  (172.7 cm) Weight: 64.5 kg (142 lb 3.2 oz) IBW/kg (Calculated) : 63.9  Temp (24hrs), Avg:98.2 F (36.8 C), Min:98.1 F (36.7 C), Max:98.5 F (36.9 C)  Recent Labs  Lab 11/11/21 0245 11/12/21 0542 11/13/21 0325 11/14/21 0503 11/15/21 0435  WBC 2.6* 7.3 6.0 3.0* 4.9  CREATININE 2.40* 2.05* 2.17* 1.88* 1.73*     Estimated Creatinine Clearance: 34.9 mL/min (A) (by C-G formula based on SCr of 1.73 mg/dL (H)).    Allergies  Allergen Reactions   Meperidine Hives    Other reaction(s): GI Upset Due to [place]    Hyoscyamine Hives and Swelling    Legs swelling Disorientation   Cefepime Other (See Comments)    Neurotoxicity occurring in setting of AKI. Ceftriaxone tolerated during same admit   Gabapentin Other (See Comments)    unknown   Lyrica [Pregabalin] Other (See Comments)    Has chronic dehydration and made it worse   Topamax [Topiramate] Other (See Comments)    Has chronic dehydration and made it worse   Zosyn [Piperacillin Sod-Tazobactam So] Other (See Comments)    Patient reports it makes her vomit, her neck stiff, and her "heart feel funny" Affected kidneys   Fentanyl Rash    Pt is allergic to fentanyl patch related to the glue (gives her a rash) Pt states she is NOT allergic to fentanyl IV medicine   Morphine And Related Rash     Antimicrobials this admission: CTX 12/17 x 1 Flagy 12/17 x 1 Meropenem 12/17 x 1 Bactrim 12/18 >> 12/19 Levaquin 12/19 >>  Dose adjustments this admission:   Microbiology results: 12/17 BCx >> Stenotrophomonas maltophilia + Pseudomonas putida  Thank you for allowing pharmacy to be a part of this patients care.  Alycia Rossetti, PharmD, BCPS Clinical Pharmacist 11/15/2021 5:11 PM   **Pharmacist phone directory can now be found on [email or web address] ([email or web address]).  Listed under [place].

## 2021-11-15 NOTE — Progress Notes (Signed)
[facility]  Date of Admission:  11/11/2021     Total days of antibiotics 5         ASSESSMENT:  Caitlin Peters completed her liver biopsy yesterday with pathology report pending. Overall feeling well. Stenotrophomonas and Pseudomonas both sensitive to flouroquinolones.  Plan of care to include levaquin 500 mg IV q 24 hours and Ciprofloxacin lock for at least 2 hours daily for 2 weeks with end date of 11/27/21. Will arrange for follow up in ID office with Dr. Baxter Flattery.   PLAN:  Change antibiotics to levofloxacin 500 mg IV q 24  Antibiotic lock with Ciprofloxacin for at least 2 hours per day. End date planned for 11/27/21.  Remaining medical and supportive care per primary team.  OPAT below. Follow up in ID office.   Diagnosis: Recurrent line infections  Culture Result: Stenotrophomonas and Pseudomoas  Allergies  Allergen Reactions   Meperidine Hives    Other reaction(s): GI Upset Due to Chrones    Hyoscyamine Hives and Swelling    Legs swelling Disorientation   Cefepime Other (See Comments)    Neurotoxicity occurring in setting of AKI. Ceftriaxone tolerated during same admit   Gabapentin Other (See Comments)    unknown   Lyrica [Pregabalin] Other (See Comments)    Has chronic dehydration and made it worse   Topamax [Topiramate] Other (See Comments)    Has chronic dehydration and made it worse   Zosyn [Piperacillin Sod-Tazobactam So] Other (See Comments)    Patient reports it makes her vomit, her neck stiff, and her "heart feel funny" Affected kidneys   Fentanyl Rash    Pt is allergic to fentanyl patch related to the glue (gives her a rash) Pt states she is NOT allergic to fentanyl IV medicine   Morphine And Related Rash    OPAT Orders Discharge antibiotics to be given via PICC line Discharge antibiotics: Leofloxacin and Ciprofloxacin Per pharmacy protocol   Duration: 2 weeks  End Date: 11/27/21  [facility] Care Per Protocol:  Home health  RN for IV administration and teaching; PICC line care and labs.     __ Please pull PIC at completion of IV antibiotics _X_ Please leave PIC in place    Fax weekly labs to [phone]  Clinic Follow Up Appt:  11/28/21 at 3pm with Dr. Baxter Flattery   Principal Problem:   Fever Active Problems:   Crohn's disease of both small and large intestine with other complication ([name])   Malnutrition ([place])   Short gut syndrome   HTN (hypertension), benign   Chronic pain syndrome   On total parenteral nutrition (TPN)   Elevated liver enzymes   Stage 4 chronic kidney disease ([name])   [name]    amLODipine  10 mg Oral Daily   buPROPion ER  200 mg Oral Daily   carvedilol  12.5 mg Oral BID WC   Chlorhexidine Gluconate Cloth  6 each Topical Daily   cycloSPORINE  1 drop Both Eyes BID   DULoxetine  120 mg Oral Daily   [START ON 11/16/2021] enoxaparin (LOVENOX) injection  30 mg Subcutaneous Q24H   feeding supplement  237 mL Oral BID BM   hydrALAZINE  100 mg Oral TID   lipase/protease/amylase  36,000 Units Oral TID WC   methadone  5 mg Oral 5 X Daily   methocarbamol  500 mg Oral BID   multivitamin with minerals  1 tablet Oral Daily   pantoprazole (PROTONIX) IV  40 mg  Intravenous Q24H   Teduglutide (rDNA)  1.5 mg Subcutaneous Daily    SUBJECTIVE:  Afebrile overnight with no acute events. Feeling good today with no new concerns/complaints. Wondering about going home.   Allergies  Allergen Reactions   Meperidine Hives    Other reaction(s): GI Upset Due to Chrones    Hyoscyamine Hives and Swelling    Legs swelling Disorientation   Cefepime Other (See Comments)    Neurotoxicity occurring in setting of AKI. Ceftriaxone tolerated during same admit   Gabapentin Other (See Comments)    unknown   Lyrica [Pregabalin] Other (See Comments)    Has chronic dehydration and made it worse   Topamax [Topiramate] Other (See Comments)    Has chronic dehydration and made it worse   Zosyn  [Piperacillin Sod-Tazobactam So] Other (See Comments)    Patient reports it makes her vomit, her neck stiff, and her "heart feel funny" Affected kidneys   Fentanyl Rash    Pt is allergic to fentanyl patch related to the glue (gives her a rash) Pt states she is NOT allergic to fentanyl IV medicine   Morphine And Related Rash     Review of Systems: Review of Systems  Constitutional:  Negative for chills, fever and weight loss.  Respiratory:  Negative for cough, shortness of breath and wheezing.   Cardiovascular:  Negative for chest pain and leg swelling.  Gastrointestinal:  Negative for abdominal pain, constipation, diarrhea, nausea and vomiting.  Skin:  Negative for rash.     OBJECTIVE: Vitals:   11/14/21 1638 11/14/21 2032 11/15/21 0445 11/15/21 0922  BP: (!) 169/77 (!) 181/78 (!) 169/68 (!) 194/76  Pulse: 70 73 75 62  Resp: 20 18 18 18   Temp: 98.1 F (36.7 C) 98.5 F (36.9 C) 98.1 F (36.7 C) 98.1 F (36.7 C)  TempSrc: Oral Oral Oral Oral  SpO2: 98% 98% 99% 100%  Weight:  64.5 kg    Height:       Body mass index is 21.62 kg/m.  Physical Exam Constitutional:      General: She is not in acute distress.    Appearance: She is well-developed.  Cardiovascular:     Rate and Rhythm: Normal rate and regular rhythm.     Heart sounds: Normal heart sounds.  Pulmonary:     Effort: Pulmonary effort is normal.     Breath sounds: Normal breath sounds.  Skin:    General: Skin is warm and dry.  Neurological:     Mental Status: She is alert and oriented to person, place, and time.  Psychiatric:        Behavior: Behavior normal.        Thought Content: Thought content normal.        Judgment: Judgment normal.    Lab Results Lab Results  Component Value Date   WBC 4.9 11/15/2021   HGB 9.4 (L) 11/15/2021   HCT 27.9 (L) 11/15/2021   MCV 92.4 11/15/2021   PLT 169 11/15/2021    Lab Results  Component Value Date   CREATININE 1.73 (H) 11/15/2021   BUN 17 11/15/2021   NA  135 11/15/2021   K 3.6 11/15/2021   CL 109 11/15/2021   CO2 20 (L) 11/15/2021    Lab Results  Component Value Date   ALT 331 (H) 11/15/2021   AST 52 (H) 11/15/2021   ALKPHOS 414 (H) 11/15/2021   BILITOT 1.2 11/15/2021     Microbiology: Recent Results (from the past 240 hour(s))  Blood  culture (routine x 2)     Status: None (Preliminary result)   Collection Time: 11/11/21  6:30 AM   Specimen: BLOOD  Result Value Ref Range Status   Specimen Description BLOOD LEFT ANTECUBITAL  Final   Special Requests   Final    BOTTLES DRAWN AEROBIC AND ANAEROBIC Blood Culture adequate volume   Culture   Final    NO GROWTH 4 DAYS Performed at [facility], [place] [address], [place], [place] [address]    Report Status PENDING  Incomplete  Blood culture (routine x 2)     Status: Abnormal   Collection Time: 11/11/21  6:35 AM   Specimen: BLOOD  Result Value Ref Range Status   Specimen Description BLOOD FEMORAL ARTERY  Final   Special Requests   Final    BOTTLES DRAWN AEROBIC AND ANAEROBIC Blood Culture adequate volume   Culture  Setup Time   Final    GRAM NEGATIVE RODS AEROBIC BOTTLE ONLY CRITICAL RESULT CALLED TO, READ BACK BY AND VERIFIED WITH: PHARMD GREG ABBOTT 11/12/21@00 :58 BY TW Performed at [facility], [place] [address], [place], [place] [address]    Culture STENOTROPHOMONAS MALTOPHILIA PSEUDOMONAS PUTIDA  (A)  Final   Report Status 11/15/2021 FINAL  Final   Organism ID, Bacteria STENOTROPHOMONAS MALTOPHILIA  Final   Organism ID, Bacteria PSEUDOMONAS PUTIDA  Final      Susceptibility   Pseudomonas putida - MIC*    CEFTAZIDIME 4 SENSITIVE Sensitive     CIPROFLOXACIN <=0.25 SENSITIVE Sensitive     GENTAMICIN <=1 SENSITIVE Sensitive     IMIPENEM 1 SENSITIVE Sensitive     PIP/TAZO 8 SENSITIVE Sensitive     * PSEUDOMONAS PUTIDA   Stenotrophomonas maltophilia - MIC*    LEVOFLOXACIN 0.25 SENSITIVE Sensitive     TRIMETH/SULFA <=20 SENSITIVE Sensitive     * STENOTROPHOMONAS  MALTOPHILIA  Blood Culture ID Panel (Reflexed)     Status: Abnormal   Collection Time: 11/11/21  6:35 AM  Result Value Ref Range Status   Enterococcus faecalis NOT DETECTED NOT DETECTED Final   Enterococcus Faecium NOT DETECTED NOT DETECTED Final   Listeria monocytogenes NOT DETECTED NOT DETECTED Final   Staphylococcus species NOT DETECTED NOT DETECTED Final   Staphylococcus aureus (BCID) NOT DETECTED NOT DETECTED Final   Staphylococcus epidermidis NOT DETECTED NOT DETECTED Final   Staphylococcus lugdunensis NOT DETECTED NOT DETECTED Final   Streptococcus species NOT DETECTED NOT DETECTED Final   Streptococcus agalactiae NOT DETECTED NOT DETECTED Final   Streptococcus pneumoniae NOT DETECTED NOT DETECTED Final   Streptococcus pyogenes NOT DETECTED NOT DETECTED Final   A.calcoaceticus-baumannii NOT DETECTED NOT DETECTED Final   Bacteroides fragilis NOT DETECTED NOT DETECTED Final   Enterobacterales NOT DETECTED NOT DETECTED Final   Enterobacter cloacae complex NOT DETECTED NOT DETECTED Final   Escherichia coli NOT DETECTED NOT DETECTED Final   Klebsiella aerogenes NOT DETECTED NOT DETECTED Final   Klebsiella oxytoca NOT DETECTED NOT DETECTED Final   Klebsiella pneumoniae NOT DETECTED NOT DETECTED Final   Proteus species NOT DETECTED NOT DETECTED Final   Salmonella species NOT DETECTED NOT DETECTED Final   Serratia marcescens NOT DETECTED NOT DETECTED Final   Haemophilus influenzae NOT DETECTED NOT DETECTED Final   Neisseria meningitidis NOT DETECTED NOT DETECTED Final   Pseudomonas aeruginosa NOT DETECTED NOT DETECTED Final   Stenotrophomonas maltophilia DETECTED (A) NOT DETECTED Final    Comment: CRITICAL RESULT CALLED TO, READ BACK BY AND VERIFIED WITH: PHARMD GREG ABBOTT 11/12/21@00 :58  BY TW    Candida albicans NOT DETECTED NOT DETECTED Final   Candida auris NOT DETECTED NOT DETECTED Final   Candida glabrata NOT DETECTED NOT DETECTED Final   Candida krusei NOT DETECTED NOT  DETECTED Final   Candida parapsilosis NOT DETECTED NOT DETECTED Final   Candida tropicalis NOT DETECTED NOT DETECTED Final   Cryptococcus neoformans/gattii NOT DETECTED NOT DETECTED Final    Comment: Performed at [facility], [place] [address], [place], [place] [address]  Resp Panel by RT-PCR (Flu A&B, Covid) Nasopharyngeal Swab     Status: None   Collection Time: 11/11/21  7:32 AM   Specimen: Nasopharyngeal Swab; Nasopharyngeal(NP) swabs in vial transport medium  Result Value Ref Range Status   SARS Coronavirus 2 by RT PCR NEGATIVE NEGATIVE Final    Comment: (NOTE) SARS-CoV-2 target nucleic acids are NOT DETECTED.  The SARS-CoV-2 RNA is generally detectable in upper respiratory specimens during the acute phase of infection. The lowest concentration of SARS-CoV-2 viral copies this assay can detect is 138 copies/mL. A negative result does not preclude SARS-Cov-2 infection and should not be used as the sole basis for treatment or other patient management decisions. A negative result may occur with  improper specimen collection/handling, submission of specimen other than nasopharyngeal swab, presence of viral mutation(s) within the areas targeted by this assay, and inadequate number of viral copies(<138 copies/mL). A negative result must be combined with clinical observations, patient history, and epidemiological information. The expected result is Negative.  Fact Sheet for Patients:  EntrepreneurPulse.com.au  Fact Sheet for Healthcare Providers:  IncredibleEmployment.be  This test is no t yet approved or cleared by the [place] FDA and  has been authorized for detection and/or diagnosis of SARS-CoV-2 by FDA under an Emergency Use Authorization (EUA). This EUA will remain  in effect (meaning this test can be used) for the duration of the COVID-19 declaration under Section 564(b)(1) of the Act, 21 U.S.C.section 360bbb-3(b)(1), unless the  authorization is terminated  or revoked sooner.       Influenza A by PCR NEGATIVE NEGATIVE Final   Influenza B by PCR NEGATIVE NEGATIVE Final    Comment: (NOTE) The Xpert Xpress SARS-CoV-2/FLU/RSV plus assay is intended as an aid in the diagnosis of influenza from Nasopharyngeal swab specimens and should not be used as a sole basis for treatment. Nasal washings and aspirates are unacceptable for Xpert Xpress SARS-CoV-2/FLU/RSV testing.  Fact Sheet for Patients: EntrepreneurPulse.com.au  Fact Sheet for Healthcare Providers: IncredibleEmployment.be  This test is not yet approved or cleared by the [place] FDA and has been authorized for detection and/or diagnosis of SARS-CoV-2 by FDA under an Emergency Use Authorization (EUA). This EUA will remain in effect (meaning this test can be used) for the duration of the COVID-19 declaration under Section 564(b)(1) of the Act, 21 U.S.C. section 360bbb-3(b)(1), unless the authorization is terminated or revoked.  Performed at [facility], [place] [address], [place], [place] [address]   Culture, blood (routine x 2)     Status: None (Preliminary result)   Collection Time: 11/14/21  6:24 AM   Specimen: BLOOD RIGHT HAND  Result Value Ref Range Status   Specimen Description BLOOD RIGHT HAND  Final   Special Requests   Final    BOTTLES DRAWN AEROBIC AND ANAEROBIC Blood Culture adequate volume   Culture   Final    NO GROWTH 1 DAY Performed at [facility], [place] [address], [place], [place] [address]  Report Status PENDING  Incomplete  Culture, blood (routine x 2)     Status: None (Preliminary result)   Collection Time: 11/14/21  6:26 AM   Specimen: BLOOD LEFT HAND  Result Value Ref Range Status   Specimen Description BLOOD LEFT HAND  Final   Special Requests   Final    AEROBIC BOTTLE ONLY Blood Culture results may not be optimal due to an inadequate volume of blood received in culture  bottles   Culture   Final    NO GROWTH 1 DAY Performed at [facility], [place] [address], [place], [place] [address]    Report Status PENDING  Incomplete     Terri Piedra, [facility]  11/15/2021  2:47 PM

## 2021-11-16 ENCOUNTER — Other Ambulatory Visit: Payer: Self-pay | Admitting: Internal Medicine

## 2021-11-16 ENCOUNTER — Inpatient Hospital Stay: Payer: Medicare Other | Admitting: Internal Medicine

## 2021-11-16 DIAGNOSIS — K912 Postsurgical malabsorption, not elsewhere classified: Secondary | ICD-10-CM

## 2021-11-16 LAB — COMPREHENSIVE METABOLIC PANEL
ALT: 242 U/L — ABNORMAL HIGH (ref 0–44)
AST: 33 U/L (ref 15–41)
Albumin: 2.2 g/dL — ABNORMAL LOW (ref 3.5–5.0)
Alkaline Phosphatase: 361 U/L — ABNORMAL HIGH (ref 38–126)
Anion gap: 5 (ref 5–15)
BUN: 12 mg/dL (ref 6–20)
CO2: 21 mmol/L — ABNORMAL LOW (ref 22–32)
Calcium: 8.7 mg/dL — ABNORMAL LOW (ref 8.9–10.3)
Chloride: 108 mmol/L (ref 98–111)
Creatinine, Ser: 1.51 mg/dL — ABNORMAL HIGH (ref 0.44–1.00)
GFR, Estimated: 39 mL/min — ABNORMAL LOW (ref >60)
Glucose, Bld: 102 mg/dL — ABNORMAL HIGH (ref 70–99)
Potassium: 3.5 mmol/L (ref 3.5–5.1)
Sodium: 134 mmol/L — ABNORMAL LOW (ref 135–145)
Total Bilirubin: 0.9 mg/dL (ref 0.3–1.2)
Total Protein: 5.6 g/dL — ABNORMAL LOW (ref 6.5–8.1)

## 2021-11-16 LAB — MAGNESIUM: Magnesium: 1.6 mg/dL — ABNORMAL LOW (ref 1.7–2.4)

## 2021-11-16 LAB — CULTURE, BLOOD (ROUTINE X 2)
Culture: NO GROWTH
Special Requests: ADEQUATE

## 2021-11-16 LAB — PHOSPHORUS: Phosphorus: 3.4 mg/dL (ref 2.5–4.6)

## 2021-11-16 LAB — TRIGLYCERIDES: Triglycerides: 89 mg/dL (ref <150)

## 2021-11-16 LAB — CBC
HCT: 26.4 % — ABNORMAL LOW (ref 36.0–46.0)
Hemoglobin: 8.9 g/dL — ABNORMAL LOW (ref 12.0–15.0)
MCH: 31.1 pg (ref 26.0–34.0)
MCHC: 33.7 g/dL (ref 30.0–36.0)
MCV: 92.3 fL (ref 80.0–100.0)
Platelets: 162 10*3/uL (ref 150–400)
RBC: 2.86 MIL/uL — ABNORMAL LOW (ref 3.87–5.11)
RDW: 12.9 % (ref 11.5–15.5)
WBC: 3.4 10*3/uL — ABNORMAL LOW (ref 4.0–10.5)
nRBC: 0 % (ref 0.0–0.2)

## 2021-11-16 MED ORDER — MAGNESIUM SULFATE 2 GM/50ML IV SOLN
2.0000 g | Freq: Once | INTRAVENOUS | Status: AC
  Administered 2021-11-16: 2 g via INTRAVENOUS
  Filled 2021-11-16: qty 50

## 2021-11-16 MED ORDER — CIPROFLOXACIN 0.2 MG/ML + HEPARIN 5000 UNITS/ML ABX LOCK SOLN: 2.5000 mL | 0 refills | Status: DC

## 2021-11-16 MED ORDER — LEVOFLOXACIN IN D5W 500 MG/100ML IV SOLN: 500.0000 mg | INTRAVENOUS | 0 refills | Status: DC

## 2021-11-16 MED ORDER — LEVOFLOXACIN IN D5W 500 MG/100ML IV SOLN
500.0000 mg | INTRAVENOUS | Status: DC
  Administered 2021-11-16: 500 mg via INTRAVENOUS
  Filled 2021-11-16 (×2): qty 100

## 2021-11-16 MED ORDER — ENOXAPARIN SODIUM 40 MG/0.4ML IJ SOSY: 40.0000 mg | PREFILLED_SYRINGE | INTRAMUSCULAR | Status: DC

## 2021-11-16 MED ORDER — HEPARIN SOD (PORK) LOCK FLUSH 100 UNIT/ML IV SOLN
250.0000 [IU] | INTRAVENOUS | Status: DC | PRN
  Administered 2021-11-16: 250 [IU]
  Filled 2021-11-16: qty 2.5

## 2021-11-16 NOTE — Discharge Summary (Signed)
[facility]  Physician Discharge Summary   Patient ID: Caitlin Peters MRN: [id number] DOB/AGE: [birth year] 60 y.o.  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   Home Health: RN for TPN and IV antibiotics Equipment/Devices: None  CODE STATUS: Full code  DISCHARGE CONDITION: fair  Diet recommendation: As before  INITIAL HISTORY: Caitlin Peters, 60 y.o. female with PMH of chronic pain; Crohn's disease s/p multiple resections with short gut syndrome and severe malnutrition on TPN through R thigh PICC line; stage 4 CKD; anxiety/depression; seizure d/o; and HTN, multiple hospitalizations of line infection recent hospitalization 11/12-21, currently with end-stage IV access presented with fever abdominal pain. She saw Dr. Baxter Flattery on 12/6 and was doing well, had her line exchanged at [facility] earlier this week . In the ED she was febrile to 101.6, abnormal LFTs. GI ID was consulted. CT was nonspecific, patient was admitted for further management. 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 This is a difficult situation because she very limited IV access. Blood cultures were repeated on 12/21.  Currently there is no plan to exchange the line as she had it done at [facility] 1 week prior to admission due to malfunction. Patient was seen by infectious disease.  She will be discharged on levofloxacin every 24 hours and ciprofloxacin intracatheter lock solution instilled in each lumen of the double-lumen catheter.  This will continue till 11/27/2021.   Acute liver dysfunction/transaminitis Has chronically baseline elevated transaminases level in 100-200, intermittently elevated up to 1000 on few occasions.   Gastroenterology was consulted due to significant rise in LFTs.  Differential diagnoses include secondary to TPN, question shock liver, drug-induced liver injury.  Liver biopsy on 12/20 by interventional radiology. LFTs have been improving.  Results of the biopsy to be followed by gastroenterology.   TPN was resumed.   Crohn's disease with multiple prior resection, short gut syndrome:  Currently TPN dependent for nutrition.     Anemia of chronic disease Required 1 unit of PRBC while she was hospitalized.  Hemoglobin stable.     Essential hypertension Continue home medications   Seizure disorder Continue Keppra   Stage IV CKD Baseline creatinine around 2.0.  Noted to be stable for the most part.  Monitor urine output.   Pancreatic insufficiency Continue Creon.   Chronic pain  Continue home Dilaudid and methadone   Moderate protein calorie malnutrition  On TPN chronically, augment diet as tolerated.  RD consult   Mood disorder  Continue her home Wellbutrin and Cymbalta    Overall stable.  Okay for discharge home today.  Home health has been ordered.   PERTINENT LABS:  The results of significant diagnostics from this hospitalization (including imaging,  microbiology, ancillary and laboratory) are listed below for  reference.    Microbiology: Recent Results (from the past 240 hour(s))  Blood culture (routine x 2)     Status: None   Collection Time: 11/11/21  6:30 AM   Specimen: BLOOD  Result Value Ref Range Status   Specimen Description BLOOD LEFT ANTECUBITAL  Final   Special Requests   Final    BOTTLES DRAWN AEROBIC AND ANAEROBIC Blood Culture adequate volume   Culture   Final    NO GROWTH 5 DAYS Performed at [facility], [address], [place], [place] [address]    Report Status 11/16/2021 FINAL  Final  Blood culture (routine x 2)     Status: Abnormal   Collection Time: 11/11/21  6:35 AM   Specimen: BLOOD  Result Value Ref Range Status   Specimen Description BLOOD FEMORAL ARTERY  Final   Special Requests   Final    BOTTLES DRAWN AEROBIC AND ANAEROBIC Blood Culture adequate volume   Culture  Setup Time   Final    GRAM NEGATIVE RODS AEROBIC BOTTLE ONLY CRITICAL RESULT CALLED TO, READ BACK BY AND VERIFIED WITH: PHARMD GREG ABBOTT 11/12/21@00 :58 BY TW Performed at [facility], [place] [address], [place], [place] [address]    Culture STENOTROPHOMONAS MALTOPHILIA PSEUDOMONAS PUTIDA  (A)  Final   Report Status 11/15/2021 FINAL  Final   Organism ID, Bacteria STENOTROPHOMONAS MALTOPHILIA  Final   Organism ID, Bacteria PSEUDOMONAS PUTIDA  Final      Susceptibility   Pseudomonas putida - MIC*    CEFTAZIDIME 4 SENSITIVE Sensitive     CIPROFLOXACIN <=0.25 SENSITIVE Sensitive     GENTAMICIN <=1 SENSITIVE Sensitive     IMIPENEM 1 SENSITIVE Sensitive     PIP/TAZO 8 SENSITIVE Sensitive     * PSEUDOMONAS PUTIDA   Stenotrophomonas maltophilia - MIC*    LEVOFLOXACIN 0.25 SENSITIVE Sensitive     TRIMETH/SULFA <=20 SENSITIVE Sensitive     * STENOTROPHOMONAS MALTOPHILIA  Blood Culture ID Panel (Reflexed)     Status: Abnormal   Collection Time: 11/11/21  6:35 AM  Result Value Ref Range Status   Enterococcus faecalis NOT  DETECTED NOT DETECTED Final   Enterococcus Faecium NOT DETECTED NOT DETECTED Final   Listeria monocytogenes NOT DETECTED NOT DETECTED Final   Staphylococcus species NOT DETECTED NOT DETECTED Final   Staphylococcus aureus (BCID) NOT DETECTED NOT DETECTED Final   Staphylococcus epidermidis NOT DETECTED NOT DETECTED Final   Staphylococcus lugdunensis NOT DETECTED NOT DETECTED Final   Streptococcus species NOT DETECTED NOT DETECTED Final   Streptococcus agalactiae NOT DETECTED NOT DETECTED Final   Streptococcus pneumoniae NOT DETECTED NOT DETECTED Final   Streptococcus pyogenes NOT DETECTED NOT DETECTED Final   A.calcoaceticus-baumannii NOT DETECTED NOT DETECTED Final   Bacteroides fragilis NOT DETECTED NOT DETECTED Final   Enterobacterales NOT DETECTED NOT DETECTED Final   Enterobacter cloacae complex NOT DETECTED NOT DETECTED Final   Escherichia coli NOT DETECTED NOT DETECTED Final   Klebsiella aerogenes NOT DETECTED NOT DETECTED Final   Klebsiella oxytoca NOT DETECTED NOT DETECTED Final   Klebsiella pneumoniae NOT DETECTED NOT DETECTED Final   Proteus species NOT DETECTED NOT DETECTED Final   Salmonella species NOT DETECTED NOT DETECTED Final   Serratia marcescens NOT DETECTED NOT DETECTED Final   Haemophilus influenzae NOT DETECTED NOT DETECTED Final   Neisseria meningitidis NOT DETECTED NOT DETECTED Final   Pseudomonas aeruginosa NOT DETECTED NOT DETECTED Final   Stenotrophomonas maltophilia DETECTED (A) NOT DETECTED Final    Comment: CRITICAL  RESULT CALLED TO, READ BACK BY AND VERIFIED WITH: PHARMD GREG ABBOTT 11/12/21@00 :58 BY TW    Candida albicans NOT DETECTED NOT DETECTED Final   Candida auris NOT DETECTED NOT DETECTED Final   Candida glabrata NOT DETECTED NOT DETECTED Final   Candida krusei NOT DETECTED NOT DETECTED Final   Candida parapsilosis NOT DETECTED NOT DETECTED Final   Candida tropicalis NOT DETECTED NOT DETECTED Final   Cryptococcus neoformans/gattii NOT DETECTED  NOT DETECTED Final    Comment: Performed at [facility], [place] [address] [place], [place] [address]  Resp Panel by RT-PCR (Flu A&B, Covid) Nasopharyngeal Swab     Status: None   Collection Time: 11/11/21  7:32 AM   Specimen: Nasopharyngeal Swab; Nasopharyngeal(NP) swabs in vial transport medium  Result Value Ref Range Status   SARS Coronavirus 2 by RT PCR NEGATIVE NEGATIVE Final    Comment: (NOTE) SARS-CoV-2 target nucleic acids are NOT DETECTED.  The SARS-CoV-2 RNA is generally detectable in upper respiratory specimens during the acute phase of infection. The lowest concentration of SARS-CoV-2 viral copies this assay can detect is 138 copies/mL. A negative result does not preclude SARS-Cov-2 infection and should not be used as the sole basis for treatment or other patient management decisions. A negative result may occur with  improper specimen collection/handling, submission of specimen other than nasopharyngeal swab, presence of viral mutation(s) within the areas targeted by this assay, and inadequate number of viral copies(<138 copies/mL). A negative result must be combined with clinical observations, patient history, and epidemiological information. The expected result is Negative.  Fact Sheet for Patients:  EntrepreneurPulse.com.au  Fact Sheet for Healthcare Providers:  IncredibleEmployment.be  This test is no t yet approved or cleared by the [place] FDA and  has been authorized for detection and/or diagnosis of SARS-CoV-2 by FDA under an Emergency Use Authorization (EUA). This EUA will remain  in effect (meaning this test can be used) for the duration of the COVID-19 declaration under Section 564(b)(1) of the Act, 21 U.S.C.section 360bbb-3(b)(1), unless the authorization is terminated  or revoked sooner.       Influenza A by PCR NEGATIVE NEGATIVE Final   Influenza B by PCR NEGATIVE NEGATIVE Final    Comment: (NOTE) The  Xpert Xpress SARS-CoV-2/FLU/RSV plus assay is intended as an aid in the diagnosis of influenza from Nasopharyngeal swab specimens and should not be used as a sole basis for treatment. Nasal washings and aspirates are unacceptable for Xpert Xpress SARS-CoV-2/FLU/RSV testing.  Fact Sheet for Patients: EntrepreneurPulse.com.au  Fact Sheet for Healthcare Providers: IncredibleEmployment.be  This test is not yet approved or cleared by the [place] FDA and has been authorized for detection and/or diagnosis of SARS-CoV-2 by FDA under an Emergency Use Authorization (EUA). This EUA will remain in effect (meaning this test can be used) for the duration of the COVID-19 declaration under Section 564(b)(1) of the Act, 21 U.S.C. section 360bbb-3(b)(1), unless the authorization is terminated or revoked.  Performed at [facility], [place] [address], [place], [place] [address]   Culture, blood (routine x 2)     Status: None (Preliminary result)   Collection Time: 11/14/21  6:24 AM   Specimen: BLOOD RIGHT HAND  Result Value Ref Range Status   Specimen Description BLOOD RIGHT HAND  Final   Special Requests   Final    BOTTLES DRAWN AEROBIC AND ANAEROBIC Blood Culture adequate volume   Culture   Final    NO GROWTH 3 DAYS Performed at  [facility], [place] [address], [place], [place] [address]    Report Status PENDING  Incomplete  Culture, blood (routine x 2)     Status: None (Preliminary result)   Collection Time: 11/14/21  6:26 AM   Specimen: BLOOD LEFT HAND  Result Value Ref Range Status   Specimen Description BLOOD LEFT HAND  Final   Special Requests   Final    AEROBIC BOTTLE ONLY Blood Culture results may not be optimal due to an inadequate volume of blood received in culture bottles   Culture   Final    NO GROWTH 3 DAYS Performed at [facility], [place] [address], [place], [place] [address]    Report Status PENDING  Incomplete      Labs:  COVID-19 Labs  Lab Results  Component Value Date   [place] 11/11/2021   [place] NEGATIVE 10/08/2021   [place] NEGATIVE 09/25/2021   [place] NEGATIVE 07/26/2021      Basic Metabolic Panel: Recent Labs  Lab 11/12/21 0542 11/13/21 0325 11/14/21 0503 11/15/21 0435 11/16/21 0343  NA 137 135 136 135 134*  K 4.2 4.0 3.8 3.6 3.5  CL 108 107 111 109 108  CO2 23 23 20* 20* 21*  GLUCOSE 75 138* 94 99 102*  BUN 42* 30* 19 17 12   CREATININE 2.05* 2.17* 1.88* 1.73* 1.51*  CALCIUM 8.8* 8.4* 8.3* 8.9 8.7*  MG 2.0 2.1  --   --  1.6*  PHOS 3.5 3.4  --   --  3.4   Liver Function Tests: Recent Labs  Lab 11/12/21 0542 11/13/21 0325 11/14/21 0503 11/15/21 0435 11/16/21 0343  AST 579* 221* 91* 52* 33  ALT 935* 628* 452* 331* 242*  ALKPHOS 468* 420* 380* 414* 361*  BILITOT 2.1* 1.1 1.0 1.2 0.9  PROT 5.5* 5.4* 5.3* 5.8* 5.6*  ALBUMIN 2.2* 2.0* 2.0* 2.3* 2.2*   Recent Labs  Lab 11/11/21 0245  LIPASE 25    CBC: Recent Labs  Lab 11/12/21 0542 11/13/21 0325 11/14/21 0503 11/15/21 0435 11/16/21 0343  WBC 7.3 6.0 3.0* 4.9 3.4*  HGB 7.1* 7.3* 6.8* 9.4* 8.9*  HCT 22.2* 22.4* 20.9* 27.9* 26.4*  MCV 96.1 95.7 95.0 92.4 92.3  PLT 117* 135* 120* 169 162   Cardiac Enzymes: Recent Labs  Lab 11/12/21 0542  CKTOTAL 28*      IMAGING STUDIES CT ABDOMEN PELVIS WO CONTRAST  Result Date: 11/11/2021 CLINICAL DATA:  Suspected biliary obstruction. Fever and generalized body aches. Abdominal pain. History of Crohn's disease, previous colitis. EXAM: CT ABDOMEN AND PELVIS WITHOUT CONTRAST TECHNIQUE: Multidetector CT imaging of the abdomen and pelvis was performed following the standard protocol without IV contrast. COMPARISON:  There are numerous prior CTs, but the 3 most recent are 10/08/2021, 09/25/2021 and 07/26/2021 FINDINGS: Factors affecting image quality: Very little body fat limits subject contrast, and lack of IV and enteric contrast also  limits diagnostic quality. Lower chest: No infiltrate is seen. There are scattered linear scar like opacities. Mild cardiomegaly without pericardial effusion. A 5 mm noncalcified nodule has developed in the posterior basal left lower lobe on series 5 axial 30. Hepatobiliary: Right femoral approach infusion catheter is again noted terminating in the left hepatic vein at the junction with the IVC. Left hepatic pneumobilia is present and not seen previously. The gallbladder is absent. Mild intrahepatic and extrahepatic biliary dilatation is again shown and seems unchanged. Liver parenchyma is grossly unremarkable as far seen. Pancreas: Mild ductal dilatation is again shown  without adjacent fat stranding. No focal masses seen without contrast. Spleen: No focal noncontrast abnormality or enlargement. Adrenals/Urinary Tract: There is no adrenal mass. No abnormality of unenhanced renal cortex. There are no stones or hydronephrosis. There is mild bladder thickening versus nondistention. Stomach/Bowel: The gastric wall is chronically thickened proximally but no more than previously. There are multifocal intestinal surgical changes including a prior right hemicolectomy, with matted and clumped unopacified small bowel limiting evaluation of the bowel but with again noted dilatation of lower abdominal small bowel up to 3.8 cm, suggestion of at least mild wall thickening of some of the clumped segments, with moderate stool retention in the descending and sigmoid colon but no pericolic inflammatory stranding. There is possible increased wall thickening of the rectum but no increased stranding. Vascular/Lymphatic: Aortic atherosclerosis. No enlarged abdominal or pelvic lymph nodes. Reproductive: The uterus is absent.  No adnexal mass is seen. Other: No abdominal wall hernia or abnormality. No abdominopelvic ascites. There is a general paucity of body fat, mild body wall anasarca. Musculoskeletal: No acute or significant osseous  findings. IMPRESSION: 1. Mild intrahepatic and extrahepatic biliary prominence is again noted and has been present on multiple prior studies. The degree of biliary dilatation is no greater than previously but there is a small volume of left hepatic pneumobilia not seen on the last study but was seen on 09/25/2021. This could be due to prior sphincterotomy or infectious process. 2. Multifocal intestinal postsurgical changes including right hemicolectomy, with chronic dilatation of the lower abdominal small bowel with fluid levels. This is also unchanged but there may be increased rectal wall thickening on today's exam. 3. General lack of body fat with body wall anasarca, which could suggest cachexia with malnutrition. 4. Cardiomegaly. 5. New 5 mm left lower lobe nodule. Imaging follow-up not required in a low risk patient. In a high-risk patient, optional 12 month follow-up CT could be considered to ensure stability. 6. Cystitis versus bladder nondistention. Electronically Signed   By: Telford Nab M.D.   On: 11/11/2021 05:51   US BIOPSY (LIVER)  Result Date: 11/14/2021 INDICATION: Elevated LFTs of uncertain etiology. Please perform ultrasound-guided liver biopsy for tissue diagnostic purposes. EXAM: ULTRASOUND GUIDED LIVER BIOPSY COMPARISON:  Right upper quadrant abdominal ultrasound-11/11/2021; CT abdomen and pelvis-11/11/2021 MEDICATIONS: None ANESTHESIA/SEDATION: Moderate (conscious) sedation was employed during this procedure as administered by the Interventional Radiology RN. A total of Versed 2 mg and Fentanyl 100 mcg was administered intravenously. Moderate Sedation Time: 12 minutes. [name] level of consciousness and vital signs were monitored continuously by radiology nursing throughout the procedure under my direct supervision. COMPLICATIONS: None immediate. PROCEDURE: Informed written consent was obtained from the patient after a discussion of the risks, benefits and alternatives to  treatment. The patient understands and consents the procedure. A timeout was performed prior to the initiation of the procedure. Ultrasound scanning was performed of the right upper abdominal quadrant and the procedure was planned. The right upper abdomen was prepped and draped in the usual sterile fashion. The overlying soft tissues were anesthetized with 1% lidocaine with epinephrine. A 17 gauge, 6.8 cm co-axial needle was advanced into a peripheral aspect of the right lobe of the liver and 3 core biopsies were obtained with an 18 gauge core device under direct ultrasound guidance. The co-axial needle track was embolized with the administration of a Gel-Foam slurry. Superficial hemostasis was obtained with manual compression. Post procedural scanning was negative for definitive area of hemorrhage. A dressing was placed. The patient tolerated  the procedure well without immediate post procedural complication. IMPRESSION: Technically successful ultrasound guided liver biopsy. Electronically Signed   By: Sandi Mariscal M.D.   On: 11/14/2021 13:10   US Abdomen Limited RUQ (LIVER/GB)  Result Date: 11/11/2021 CLINICAL DATA:  Elevated LFTs. EXAM: ULTRASOUND ABDOMEN LIMITED RIGHT UPPER QUADRANT COMPARISON:  Abdominal ultrasound 07/10/2021 FINDINGS: Gallbladder: Surgically absent. Common bile duct: Diameter: 7.5 mm Liver: No focal lesion identified. Within normal limits in parenchymal echogenicity. Portal vein is patent on color Doppler imaging with normal direction of blood flow towards the liver. Other: Increased echogenicity of the right kidney, partially visualized. IMPRESSION: 1. No acute abnormality identified. 2. Increased echogenicity of the right kidney again seen suggesting medical renal disease. Electronically Signed   By: Ofilia Neas M.D.   On: 11/11/2021 08:53    DISCHARGE EXAMINATION: Vitals:   11/16/21 0848 11/16/21 0853 11/16/21 1109 11/16/21 1605  BP: (!) 198/85 (!) 198/85 (!) 161/75 (!) 157/68   Pulse: 65 65 68 67  Resp:  16 16 19   Temp:  98.2 F (36.8 C)  98.1 F (36.7 C)  TempSrc:  Oral  Oral  SpO2:  100% 100% 99%  Weight:      Height:       General appearance: Awake alert.  In no distress Resp: Clear to auscultation bilaterally.  Normal effort Cardio: S1-S2 is normal regular.  No S3-S4.  No rubs murmurs or bruit GI: Abdomen is soft.  Nontender nondistended.  Bowel sounds are present normal.  No masses organomegaly   DISPOSITION: Home  Discharge Instructions     [facility] pharmacist to adjust dose for Vancomycin, Aminoglycosides and other anti-infective therapies as requested by physician.   Complete by: As directed    [facility] to provide Cath Flo 91m   Complete by: As directed    Administer for PICC line occlusion and as ordered by physician for other access device issues.   Anaphylaxis Kit: Provided to treat any anaphylactic reaction to the medication being provided to the patient if First Dose or when requested by physician   Complete by: As directed    Epinephrine 182mml vial / amp: Administer 0.37m38m0.37ml62mubcutaneously once for moderate to severe anaphylaxis, nurse to call physician and pharmacy when reaction occurs and call 911 if needed for immediate care   Diphenhydramine 50mg57mIV vial: Administer 25-50mg 69mM PRN for first dose reaction, rash, itching, mild reaction, nurse to call physician and pharmacy when reaction occurs   Sodium Chloride 0.9% NS 500ml I15mdminister if needed for hypovolemic blood pressure drop or as ordered by physician after call to physician with anaphylactic reaction   Call MD for:  difficulty breathing, headache or visual disturbances   Complete by: As directed    Call MD for:  extreme fatigue   Complete by: As directed    Call MD for:  persistant dizziness or light-headedness   Complete by: As directed    Call MD for:  persistant nausea and vomiting   Complete by: As directed    Call MD for:  severe  uncontrolled pain   Complete by: As directed    Call MD for:  temperature >100.4   Complete by: As directed    Change dressing on IV access line weekly and PRN   Complete by: As directed    Diet - low sodium heart healthy   Complete by: As directed    Discharge instructions   Complete by: As directed    Please  be sure to follow-up with your primary care provider in 1 week and with your gastroenterologist at [facility] in the next 2 to 3 weeks.  The gastroenterologist will touch base with you about your biopsy reports.  You were cared for by a hospitalist during your hospital stay. If you have any questions about your discharge medications or the care you received while you were in the hospital after you are discharged, you can call the unit and asked to speak with the hospitalist on call if the hospitalist that took care of you is not available. Once you are discharged, your primary care physician will handle any further medical issues. Please note that NO REFILLS for any discharge medications will be authorized once you are discharged, as it is imperative that you return to your primary care physician (or establish a relationship with a primary care physician if you do not have one) for your aftercare needs so that they can reassess your need for medications and monitor your lab values. If you do not have a primary care physician, you can call [phone] for a physician referral.   Flush IV access with Sodium Chloride 0.9% and Heparin 10 units/ml or 100 units/ml   Complete by: As directed    Home infusion instructions - [facility]   Complete by: As directed    Instructions: Flush IV access with Sodium Chloride 0.9% and Heparin 10units/ml or 100units/ml   Change dressing on IV access line: Weekly and PRN   Instructions Cath Flo 32m: Administer for PICC Line occlusion and as ordered by physician for other access device   [facility] pharmacist to adjust dose for: Vancomycin,  Aminoglycosides and other anti-infective therapies as requested by physician   Increase activity slowly   Complete by: As directed    Method of administration may be changed at the discretion of home infusion pharmacist based upon assessment of the patient and/or caregivers ability to self-administer the medication ordered   Complete by: As directed    No wound care   Complete by: As directed          Allergies as of 11/16/2021       Reactions   Meperidine Hives   Other reaction(s): GI Upset Due to [place]   Hyoscyamine Hives, Swelling   Legs swelling Disorientation   Cefepime Other (See Comments)   Neurotoxicity occurring in setting of AKI. Ceftriaxone tolerated during same admit   Gabapentin Other (See Comments)   unknown   Lyrica [pregabalin] Other (See Comments)   Has chronic dehydration and made it worse   Topamax [topiramate] Other (See Comments)   Has chronic dehydration and made it worse   Zosyn [piperacillin Sod-tazobactam So] Other (See Comments)   Patient reports it makes her vomit, her neck stiff, and her "heart feel funny" Affected kidneys   Fentanyl Rash   Pt is allergic to fentanyl patch related to the glue (gives her a rash) Pt states she is NOT allergic to fentanyl IV medicine   Morphine And Related Rash        Medication List     STOP taking these medications    lidocaine 5 % Commonly known as: LIDODERM       TAKE these medications    acetaminophen 650 MG CR tablet Commonly known as: TYLENOL Take 1,300 mg by mouth every 6 (six) hours.   amLODipine 10 MG tablet Commonly known as: NORVASC Take 1 tablet by mouth once daily   buPROPion ER 100  MG 12 hr tablet Commonly known as: WELLBUTRIN SR Take 2 tablets by mouth once daily   Calcium 500 MG tablet Take 500 mg by mouth daily.   carvedilol 12.5 MG tablet Commonly known as: COREG Take 1 tablet (12.5 mg total) by mouth 2 (two) times daily with a meal.   cholecalciferol 25 MCG (1000  UNIT) tablet Commonly known as: VITAMIN D3 Take 1,000 Units by mouth daily.   ciprofloxacin 0.2 mg/ml + heparin 5000 units/ml Soln 2.5 mLs by Intracatheter route daily for 12 days. Dwell in PICC lumen #2 for a minimum of 2 hours daily when PICC not in use End date 11/27/21   ciprofloxacin 0.2 mg/ml + heparin 5000 units/ml Soln 2.5 mLs by Intracatheter route daily for 11 days. Dwell in PICC lumen #1 for a minimum of 2 hours daily when PICC not in use. End 11/27/21   cycloSPORINE 0.05 % ophthalmic emulsion Commonly known as: RESTASIS Place 1 drop into both eyes 2 (two) times daily.   Dexilant 60 MG capsule Generic drug: dexlansoprazole Take 1 capsule by mouth once daily What changed: how much to take   diphenoxylate-atropine 2.5-0.025 MG tablet Commonly known as: LOMOTIL Take 1 tablet by mouth 2 (two) times daily as needed for diarrhea or loose stools.   DULoxetine 60 MG capsule Commonly known as: CYMBALTA Take 2 capsules (120 mg total) by mouth daily.   estradiol 2 MG tablet Commonly known as: ESTRACE Take 1 tablet by mouth once daily   famotidine 20 MG tablet Commonly known as: PEPCID Take 20 mg by mouth daily as needed for heartburn or indigestion.   Gattex 5 MG Kit Generic drug: Teduglutide (rDNA) Inject 1.5 mg into the skin daily.   hydrALAZINE 100 MG tablet Commonly known as: APRESOLINE Take 1 tablet (100 mg total) by mouth 3 (three) times daily.   HYDROmorphone 4 MG tablet Commonly known as: DILAUDID Take 4 mg by mouth 4 (four) times daily.   levETIRAcetam 500 MG tablet Commonly known as: KEPPRA Take 1 tablet (500 mg total) by mouth 2 (two) times daily.   levofloxacin 500 MG/100ML Soln Commonly known as: LEVAQUIN Inject 100 mLs (500 mg total) into the vein daily for 12 days. Indication: Stenotrophomonas/Psuedomonas Bacteremia First Dose: Yes  Last Day of Therapy: 11/27/21 Labs - Once weekly: CBC/D and BMP Labs - Every other week: ESR and CRP  Method of  administration: IV Push Method of administration may be changed at the discretion of home infusion pharmacist based upon assessment of the patient and/or caregiver's ability to self-administer the medication ordered   lipase/protease/amylase 36000 UNITS Cpep capsule Commonly known as: Creon Take 1 capsule (36,000 Units total) by mouth See admin instructions. 36000 units with meals What changed:  when to take this additional instructions   loperamide 2 MG capsule Commonly known as: IMODIUM Take 4 mg by mouth 2 (two) times daily as needed for diarrhea or loose stools.   methadone 5 MG tablet Commonly known as: DOLOPHINE Take 1 tablet (5 mg total) by mouth 5 (five) times daily.   methocarbamol 500 MG tablet Commonly known as: Robaxin Take 1 tablet (500 mg total) by mouth in the morning and at bedtime.   MULTIVITAMIN ADULT PO Take 1 tablet by mouth daily.   Narcan 4 MG/0.1ML Liqd nasal spray kit Generic drug: naloxone Place 1 spray into the nose once as needed (overdose).   polyvinyl alcohol 1.4 % ophthalmic solution Commonly known as: LIQUIFILM TEARS Place 1 drop into both eyes  as needed for dry eyes.   PRESCRIPTION MEDICATION Inject 1 each into the vein daily. Home TPN . [facility] in [facility] [place] . 1 bag for 12 hours. [phone]   promethazine 25 MG tablet Commonly known as: PHENERGAN TAKE 1 TABLET BY MOUTH EVERY 6 HOURS AS NEEDED FOR NAUSEA FOR VOMITING   sodium chloride 0.9 % infusion Inject 1,000 mLs into the vein daily. For chronic dehydration   sucralfate 1 GM/10ML suspension Commonly known as: Carafate Take 10 mLs (1 g total) by mouth 2 (two) times daily.   TRALEMENT IV Inject 1 mL into the vein See admin instructions. Used in TPN bag 4 times weekly   vitamin B-12 1000 MCG tablet Commonly known as: CYANOCOBALAMIN Take 1,000 mcg by mouth daily.               Discharge Care Instructions  (From admission, onward)           Start      Ordered   11/16/21 0000  Change dressing on IV access line weekly and PRN  (Home infusion instructions - [facility] )        11/16/21 1443              Follow-up Information     [name], [name], MD. Schedule an appointment as soon as possible for a visit in 1 week(s).   Specialty: Internal Medicine Contact information: [place] [address] [phone]                 TOTAL DISCHARGE TIME: 45 minutes  [place]  [facility] Pager on [email or web address]  11/17/2021, 12:55 PM

## 2021-11-16 NOTE — TOC Transition Note (Signed)
Transition of Care [facility]) - CM/SW Discharge Note   Patient Details  Name: Caitlin Peters MRN: [id number] Date of Birth: 1961/11/02  Transition of Care [facility]) CM/SW Contact:  Tom-Johnson, Renea Ee, RN Phone Number: 11/16/2021, 4:06 PM   Clinical Narrative:    Patient is scheduled for discharge today. Home TPN order resumed and will be going home with IV abt. Pam with [employer] to provide supplies and Hoyle Sauer with [employer] for RN services. Daughter to transport at discharge. No DME needs. No further TOC needs noted.   Final next level of care: [place] Barriers to Discharge: Barriers Resolved   Patient Goals and CMS Choice Patient states their goals for this hospitalization and ongoing recovery are:: To go home CMS Medicare.gov Compare Post Acute Care list provided to:: Patient Choice offered to / list presented to : NA  Discharge Placement                       Discharge Plan and Services   Discharge Planning Services: CM Consult Post Acute Care Choice: NA          DME Arranged: N/A DME Agency: NA       HH Arranged: RN, IV Antibiotics, TPN HH Agency: Tour manager ([place]) Date [place]: 11/13/21 Time [place]: 1410 Representative spoke with at [place]: Carolynn Sayers with [employer] and Hoyle Sauer with [place] Determinants of Health (SDOH) Interventions     Readmission Risk Interventions Readmission Risk Prevention Plan 09/26/2021 06/22/2021 02/10/2021  Transportation Screening Complete Complete Complete  PCP or Specialist Appt within 3-5 Days - - -  Not Complete comments - - -  [place]RI or [place] Work Consult for [place] Planning/Counseling - - -  SW consult not completed comments - - -  Palliative Care Screening - - -  Medication Review Press photographer) Complete Complete Complete  PCP or Specialist appointment within 3-5 days of discharge - Complete -  [place] or [place] Complete Complete Complete  SW Recovery Care/Counseling Consult Complete Complete Complete  Palliative Care Screening Not Applicable Not Applicable Not [place] Not Applicable Not Applicable Not Applicable

## 2021-11-16 NOTE — Progress Notes (Signed)
PHARMACY - TOTAL PARENTERAL NUTRITION CONSULT NOTE   Indication: Short bowel syndrome, chronic TPN  Patient Measurements: Height: 5' 8"  (172.7 cm) Weight: 64.5 kg (142 lb 3.2 oz) IBW/kg (Calculated) : 63.9 TPN AdjBW (KG): 60 Body mass index is 21.62 kg/m.  Assessment: 83 YOF presenting with Stenotrophomonas bacteremia likely from line infection. Patient was hospitalized 11/12-21 for bacteremia + PICC infection and was treated with antibiotic lock without line change. Patient then had PICC line change 12/15 with IR. Patient is on chronic TPN for short gut syndrome (due to Crohn's disease s/p multiple resections, TPN with [employer]), with recent PICC line infection and hx of recurrent line infections.  Pharmacy consulted for TPN   Glucose / Insulin: BG < 100, has not needed insulin in past admissions Electrolytes:  Na 137, CoCa 10.2 others wnl Renal: SCr 2.05 (BL~ 1.4), BUN 42 down  Hepatic: Alkphos 468, AST 579, ALT 935- all down trending, Tbili 2.1 up, albumin 2.2, TG 160  Intake / Output; MIVF: NS @ 75, no output charted GI Imaging:  12/17 CT abdomen - hepatic pneumobilia, inc rectal wall thickening, General lack of body fat with body wall anasarca suggests cachexia 12/17 RUQ [place] No acute abnormality identified. GI Surgeries / Procedures: none   Central access: PICC TPN start date: Continued from PTA, 12/17 here  Home TPN:  [facility] [phone] Cycle 1800 mL over 12 hours 85 g protein, 220 g dex 20% SMOF 60g 3x week MWF  Nutritional Goals: Goal TPN rate provides TBD  RD Assessment: Pending Last RD recs 12/19 Estimated Needs Total Energy Estimated Needs: 1900-2100 Total Protein Estimated Needs: 95-110 grams Total Fluid Estimated Needs: >1.9L  Current Nutrition:  None  Plan:  Per MD, patient to discharge home today No TPN to be ordered this evening, reassess if patient discharge is delayed/changed   Thank you for allowing pharmacy to be a part of this patients  care.  Ardyth Harps, PharmD Clinical Pharmacist

## 2021-11-16 NOTE — Plan of Care (Signed)

## 2021-11-16 NOTE — Progress Notes (Signed)
DISCHARGE NOTE   Caitlin Peters to be discharged Home per MD order. Patient verbalized understanding.  Skin clean, dry and intact without evidence of skin break down, no evidence of skin tears noted. R thigh PICC line capped. Patient free of lines, drains, and wounds.   Discharge packet assembled. An After Visit Summary (AVS) was printed and given to the patient. all questions and concerns addressed by dayshift RN [place]. escorted patient via wheelchair. Discharged to home via private auto.  Babs Sciara, RN

## 2021-11-17 ENCOUNTER — Telehealth: Payer: Self-pay | Admitting: Gastroenterology

## 2021-11-17 LAB — SURGICAL PATHOLOGY

## 2021-11-17 NOTE — Telephone Encounter (Signed)
Patient's liver biopsy returned as outlined:  FINAL MICROSCOPIC DIAGNOSIS:   A. 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 There is  patchy minimal to mild portal and lobular inflammation without evidence  of interface hepatitis.  The infiltrate is mostly lymphocytic in nature.  The hepatic lobules show randomly distributed and well demarcated pale  intracytoplasmic inclusions.  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 Native bile ducts and portal vascular  structures are unremarkable. Trichrome and reticulin stains show that  portal tracts are compact, appropriately sized for the structures  contained within, and are negative for pathologic fibrosis. Iron stain  shows no further pathologic features.  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  The overall findings are nonspecific.  Differential diagnosis for such pale intracytoplasmic inclusions can  include mild drug-induced injury (e.g., immunosuppressive agents,  antibiotics, and insulin), polypharmacotherapy, chronic hepatitis B,  type 2 diabetes, graft-versus-host disease (GVHD) and also rare reports  have shown slight inclusions in patients on chronic parenteral nutrition  for short bowel syndrome.  Clinical correlation is suggested.     [place] can you please let this patient know that her biopsy does not show any scar tissue or fibrosis which is excellent news. Some mild nonspecific findings, not sure if this is due to TPN or medications. She should be following up with her primary GI at [facility], they need to have these results if you can let her know and send these results to her [facility] GI. Thanks

## 2021-11-17 NOTE — Telephone Encounter (Signed)
Called and spoke with patient in regards to her liver biopsy results and recommendations. Pt has been advised to follow up with her primary GI at [facility]./ Pt is aware that we have faxed the liver biopsy report and finding to Dr. Brayton Layman at [place]. Pt verbalized understanding and had no concerns at the end of the call.  [place] report, liver biopsy report and pathology faxed to Dr. Brayton Layman via epic.

## 2021-11-18 ENCOUNTER — Other Ambulatory Visit: Payer: Self-pay | Admitting: Internal Medicine

## 2021-11-18 DIAGNOSIS — K219 Gastro-esophageal reflux disease without esophagitis: Secondary | ICD-10-CM

## 2021-11-19 LAB — CULTURE, BLOOD (ROUTINE X 2)
Culture: NO GROWTH
Culture: NO GROWTH
Special Requests: ADEQUATE

## 2021-11-21 ENCOUNTER — Encounter: Payer: Self-pay | Admitting: Gastroenterology

## 2021-11-21 ENCOUNTER — Telehealth: Payer: Self-pay

## 2021-11-21 NOTE — Telephone Encounter (Signed)
Aletta Edouard with [facility] called to report that patient was complaining of bleeding under central line dressing. Nursing changed the dressing 12/26 and reported that there was no active bleeding, but that Biopatch did have blood on it. Jackelyn Poling states she talked to the patient in detail regarding when to call about bleeding/need for dressing change. She says patient is still complaining of some slight oozing, but no saturation.   Debbie also wanted to let the provider know that patient's BP was 172/80, but that patient had missed 10 doses of hydralazine. She says the patient has resumed the medication as of yesterday. Nursing is scheduled to see patient 12/29 for labs and visit.   Beryle Flock, RN

## 2021-11-21 NOTE — Telephone Encounter (Signed)
-----   Message from Irving Copas., MD sent at 11/19/2021 11:22 PM EST ----- Regarding: RE: path report liver biopsy KLB,  I know that you are not actively following this patient anymore. These are the results of her liver biopsy. No need for repeat ERCP at this time.  Ammie or Agustina Witzke, Please send these results to the patient's primary gastroenterologist at [place].   Thanks. GM ----- Message ----- From: Bonnielee Haff, MD Sent: 11/17/2021   1:02 PM EST To: Irving Copas., MD Subject: path report liver biopsy                       Path report from liver biopsy now available. thanks ----- Message ----- From: Buel Ream, Lab In [place] Sent: 11/17/2021   6:55 AM EST To: Bonnielee Haff, MD

## 2021-11-21 NOTE — Telephone Encounter (Signed)
Liver biopsy results previously faxed to [facility] by Delila Spence RN.

## 2021-11-21 NOTE — Telephone Encounter (Signed)
Transition Care Management Unsuccessful Follow-up Telephone Call  Date of discharge and from where:  Caitlin Peters 11-16-21 Dx Sepsis  Attempts:  1st Attempt  Reason for unsuccessful TCM follow-up call:  Left voice message  Transition Care Management Follow-up Telephone Call Date of discharge and from where: Caitlin Peters 11-16-21 Dx Sepsis How have you been since you were released from the hospital? Feeling a little better  Any questions or concerns? No  Items Reviewed: Did the pt receive and understand the discharge instructions provided? Yes  Medications obtained and verified? Yes  Other? No  Any new allergies since your discharge? No  Dietary orders reviewed? Yes Do you have support at home? Yes   Home Care and Equipment/Supplies: Were home health services ordered? Yes, RN If so, what is the name of the agency?  Name unknown Has the agency set up a time to come to the patient's home? yes Were any new equipment or medical supplies ordered?  No What is the name of the medical supply agency? na Were you able to get the supplies/equipment? not applicable Do you have any questions related to the use of the equipment or supplies? No  Functional Questionnaire: (I = Independent and D = Dependent) ADLs: I  Bathing/Dressing- I  Meal Prep- I  Eating- I  Maintaining continence- I  Transferring/Ambulation- I  Managing Meds- I  Follow up appointments reviewed:  PCP Hospital f/u appt confirmed? Yes  Scheduled to see Dr Deniece Ree on 11-24-21 @ [facility] f/u appt confirmed? No . Are transportation arrangements needed? No  If their condition worsens, is the pt aware to call PCP or go to the Emergency Dept.? Yes Was the patient provided with contact information for the PCP's office or ED? Yes Was to pt encouraged to call back with questions or concerns? Yes

## 2021-11-21 NOTE — Telephone Encounter (Signed)
Mansouraty, Telford Nab., MD  Bonnielee Haff, MD; Thornton Park, MD; Timothy Lasso, RN; Havery Moros, Carlota Raspberry, MD KLB,  I know that you are not actively following this patient anymore.  These are the results of her liver biopsy.  No need for repeat ERCP at this time.   Ammie or Webber Michiels,  Please send these results to the patient's primary gastroenterologist at [place].    Thanks.  GM        Previous Messages

## 2021-11-24 ENCOUNTER — Encounter: Payer: Self-pay | Admitting: Internal Medicine

## 2021-11-24 ENCOUNTER — Ambulatory Visit (INDEPENDENT_AMBULATORY_CARE_PROVIDER_SITE_OTHER): Payer: Medicare Other | Admitting: Internal Medicine

## 2021-11-24 VITALS — BP 128/60 | HR 60 | Temp 98.3°F | Ht 68.0 in | Wt 135.2 lb

## 2021-11-24 DIAGNOSIS — R569 Unspecified convulsions: Secondary | ICD-10-CM | POA: Diagnosis not present

## 2021-11-24 DIAGNOSIS — T80211D Bloodstream infection due to central venous catheter, subsequent encounter: Secondary | ICD-10-CM

## 2021-11-24 DIAGNOSIS — Z09 Encounter for follow-up examination after completed treatment for conditions other than malignant neoplasm: Secondary | ICD-10-CM

## 2021-11-24 DIAGNOSIS — E43 Unspecified severe protein-calorie malnutrition: Secondary | ICD-10-CM | POA: Diagnosis not present

## 2021-11-24 DIAGNOSIS — K50819 Crohn's disease of both small and large intestine with unspecified complications: Secondary | ICD-10-CM

## 2021-11-24 DIAGNOSIS — R7401 Elevation of levels of liver transaminase levels: Secondary | ICD-10-CM

## 2021-11-24 DIAGNOSIS — N184 Chronic kidney disease, stage 4 (severe): Secondary | ICD-10-CM

## 2021-11-24 DIAGNOSIS — K912 Postsurgical malabsorption, not elsewhere classified: Secondary | ICD-10-CM

## 2021-11-24 NOTE — Progress Notes (Signed)
Hospital follow-up visit     CC/Reason for Visit: Hospitalization follow-up  HPI: Caitlin Peters is a 60 y.o. female who is coming in today for the above mentioned reasons, specifically transitional care services face-to-face visit.    Dates hospitalized: 11/11/2021-11/16/2021 Days since discharge from hospital: 8 Patient was discharged from the hospital to: Home Reason for admission to hospital: Fever Date of interactive phone contact with patient and/or caregiver: First attempt was unsuccessful on 12/22, call was completed on 12/27 due to delays from Christmas holiday.  I have reviewed in detail patient's discharge summary plus pertinent specific notes, labs, and images from the hospitalization.  Yes  Patient was admitted to the hospital on 12/17 with a fever.  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  Cultures this admission grew Stenotrophomonas maltophilia and Pseudomonas [place].  She was seen by infectious disease and she is on Levaquin and Cipro lock through her femoral catheter.  This is planned until January 2 when she sees ID again.  During this admission she had marked elevation of transaminases and ended up with a liver biopsy with relatively normal results.  There is a question whether this was TPN related.  She continues to follow locally with GI and also at [facility].  During this admission she required 1 unit of PRBCs due to a drop in hemoglobin.  Since discharge from the hospital she feels well, other than sleepy throughout the day which is her usual.  Medication reconciliation was done today and patient is taking meds as recommended by discharging hospitalist/specialist.  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; 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; jejunal to transverse colon anastomosis Treated at [facility] lengthening to 165cm Dr Alene Mires, [facility]   Vitamin B12 deficiency     Past Surgical History:  Procedure Laterality Date   ABDOMINAL ADHESION SURGERY  01/22/2018   APPENDECTOMY  1989   BILIARY DILATION  11/26/2019   Procedure: BILIARY DILATION;  Surgeon: Jackquline Denmark, MD;  Location: [facility];  Service: Endoscopy;;   BILIARY DILATION  03/08/2020   Procedure: BILIARY DILATION;  Surgeon: Irving Copas., MD;  Location: [place];  Service: Gastroenterology;;   BIOPSY  03/08/2020   Procedure: BIOPSY;  Surgeon: Irving Copas., MD;  Location: [facility] ENDOSCOPY;  Service: Gastroenterology;;   CHEST WALL RESECTION     right thoracotomy,resection of chest mass with anterior rib and reconstruction using prosthetic mesh and video arthroscopy   CHOLECYSTECTOMY  01/22/2018   COLONOSCOPY  2019   ENTEROSTOMY CLOSURE  04/1999   ERCP N/A 11/26/2019   Procedure: ENDOSCOPIC RETROGRADE CHOLANGIOPANCREATOGRAPHY (ERCP);  Surgeon: Jackquline Denmark, MD;  Location: [facility];  Service: Endoscopy;  Laterality: N/A;   ERCP N/A 03/08/2020   Procedure: ENDOSCOPIC RETROGRADE CHOLANGIOPANCREATOGRAPHY (ERCP);  Surgeon: Irving Copas., MD;  Location: [place];  Service: Gastroenterology;  Laterality: N/A;   ESOPHAGOGASTRODUODENOSCOPY N/A 03/08/2020   Procedure:  ESOPHAGOGASTRODUODENOSCOPY (EGD);  Surgeon: Irving Copas., MD;  Location: [place];  Service: Gastroenterology;  Laterality: N/A;   EUS N/A 03/08/2020   Procedure: UPPER ENDOSCOPIC ULTRASOUND (EUS) LINEAR;  Surgeon: Irving Copas., MD;  Location: [place];  Service: Gastroenterology;  Laterality: N/A;   ILEOCECETOMY  03/1999   ileocolon resection with abdominal stoma   ILEOSTOMY CLOSURE  2001   IR FLUORO GUIDE CV LINE LEFT  01/07/2020   IR FLUORO GUIDE CV LINE LEFT  03/09/2020   IR FLUORO GUIDE CV LINE LEFT  05/09/2020   IR FLUORO GUIDE CV LINE LEFT  07/20/2020   IR FLUORO GUIDE CV LINE RIGHT  08/05/2020   IR FLUORO GUIDE CV LINE RIGHT  04/03/2021   IR PTA VENOUS EXCEPT DIALYSIS CIRCUIT  01/07/2020   IR REMOVAL TUN CV CATH W/O FL  08/05/2020   IR REMOVAL TUN CV CATH W/O FL  07/11/2021   IR US GUIDE VASC ACCESS LEFT     x 2 06/17/19 and 09/14/2019   IR US GUIDE VASC ACCESS RIGHT  08/05/2020   KNEE SURGERY     right knee    LAPAROSCOPIC SMALL BOWEL RESECTION  2009   2000-2009.  SB resections for Crohns Disease - now with Short gut   OMENTECTOMY  01/22/2018   PARTIAL HYSTERECTOMY  1984   with LSO   REMOVAL OF STONES  11/26/2019   Procedure: REMOVAL OF STONES;  Surgeon: Jackquline Denmark, MD;  Location: [facility];  Service: Endoscopy;;   REMOVAL OF STONES  03/08/2020   Procedure: REMOVAL OF STONES;  Surgeon: Irving Copas., MD;  Location: [facility] ENDOSCOPY;  Service: Gastroenterology;;   SALPINGOOPHORECTOMY Left 1984   SALPINGOOPHORECTOMY Right 1990   SERIAL TRANSVERSE ENTEROPLASTY (STEP) - SMALL BOWEL LENGTHENING  01/22/2018   Dr Alene Mires, [facility] - SB length from 120 to 165cm    SMALL INTESTINE SURGERY  2002   SMALL INTESTINE SURGERY  2003   SPHINCTEROTOMY  11/26/2019   Procedure: SPHINCTEROTOMY;  Surgeon: Jackquline Denmark, MD;  Location: [facility];  Service: Endoscopy;;   TOTAL ABDOMINAL HYSTERECTOMY  1990   with RSO   UPPER GASTROINTESTINAL ENDOSCOPY       Social History:  reports that she has quit smoking. Her smoking use included cigarettes. She has a 17.50 pack-year smoking history. She has never used smokeless tobacco. She reports that she does not currently use alcohol. She reports that she does not use drugs.  Allergies: Allergies  Allergen Reactions   Meperidine Hives    Other reaction(s): GI Upset Due to [place]    Hyoscyamine Hives and Swelling    Legs swelling Disorientation   Cefepime Other (See Comments)    Neurotoxicity occurring in setting of AKI. Ceftriaxone tolerated during same admit   Gabapentin Other (See Comments)    unknown   Lyrica [Pregabalin] Other (See Comments)    Has chronic dehydration and made it worse   Topamax [Topiramate] Other (See Comments)    Has chronic dehydration and made it worse   Zosyn [Piperacillin Sod-Tazobactam So] Other (See Comments)    Patient reports it makes her vomit, her neck stiff, and her "heart feel funny" Affected kidneys   Fentanyl  Rash    Pt is allergic to fentanyl patch related to the glue (gives her a rash) Pt states she is NOT allergic to fentanyl IV medicine   Morphine And Related Rash    Family History:  Family History  Problem Relation Age of Onset   Seizures Mother    Glaucoma Mother    CAD Father    Heart disease Father    Hypertension Father    Breast cancer Sister    Multiple sclerosis Sister    Diabetes Sister    Lupus Sister    Colon cancer Other    Crohn's disease Other      Current Outpatient Medications:    acetaminophen (TYLENOL) 650 MG CR tablet, Take 1,300 mg by mouth every 6 (six) hours., Disp: , Rfl:    amLODipine (NORVASC) 10 MG tablet, Take 1 tablet by mouth once daily (Patient taking differently: Take 10 mg by mouth daily.), Disp: 90 tablet, Rfl: 0   buPROPion ER (WELLBUTRIN SR) 100 MG 12 hr tablet, Take 2 tablets by mouth once daily (Patient taking differently: Take 200 mg by mouth daily.), Disp: 60 tablet, Rfl: 2   Calcium 500 MG  tablet, Take 500 mg by mouth daily., Disp: , Rfl:    carvedilol (COREG) 12.5 MG tablet, Take 1 tablet (12.5 mg total) by mouth 2 (two) times daily with a meal., Disp: 60 tablet, Rfl: 0   cholecalciferol (VITAMIN D3) 25 MCG (1000 UT) tablet, Take 1,000 Units by mouth daily. , Disp: , Rfl:    ciprofloxacin (CIPRO) 400 MG/200ML SOLN, Inject into the vein., Disp: , Rfl:    ciprofloxacin 0.2 mg/ml + heparin 5000 units/ml SOLN, 2.5 mLs by Intracatheter route daily for 12 days. Dwell in PICC lumen #2 for a minimum of 2 hours daily when PICC not in use End date 11/27/21, Disp: 30 mL, Rfl: 0   ciprofloxacin 0.2 mg/ml + heparin 5000 units/ml SOLN, 2.5 mLs by Intracatheter route daily for 11 days. Dwell in PICC lumen #1 for a minimum of 2 hours daily when PICC not in use. End 11/27/21, Disp: 27.5 mL, Rfl: 0   cycloSPORINE (RESTASIS) 0.05 % ophthalmic emulsion, Place 1 drop into both eyes 2 (two) times daily., Disp: , Rfl:    DEXILANT 60 MG capsule, Take 1 capsule by mouth once daily, Disp: 90 capsule, Rfl: 0   diphenoxylate-atropine (LOMOTIL) 2.5-0.025 MG tablet, Take 1 tablet by mouth 2 (two) times daily as needed for diarrhea or loose stools., Disp: , Rfl:    DULoxetine (CYMBALTA) 60 MG capsule, Take 2 capsules (120 mg total) by mouth daily., Disp: 180 capsule, Rfl: 1   estradiol (ESTRACE) 2 MG tablet, Take 1 tablet by mouth once daily (Patient taking differently: Take 2 mg by mouth daily.), Disp: 90 tablet, Rfl: 0   famotidine (PEPCID) 20 MG tablet, Take 20 mg by mouth daily as needed for heartburn or indigestion., Disp: , Rfl:    GATTEX 5 MG KIT, Inject 1.5 mg into the skin daily., Disp: , Rfl:    hydrALAZINE (APRESOLINE) 100 MG tablet, Take 1 tablet (100 mg total) by mouth 3 (three) times daily., Disp: 90 tablet, Rfl: 1   HYDROmorphone (DILAUDID) 4 MG tablet, Take 4 mg by mouth 4 (four) times daily., Disp: , Rfl:    levETIRAcetam (KEPPRA) 500 MG tablet, Take 1 tablet (500 mg total) by mouth 2 (two) times  daily., Disp: 60 tablet, Rfl: 11   levofloxacin (LEVAQUIN) 500 MG/100ML SOLN, Inject 100 mLs (500  mg total) into the vein daily for 12 days. Indication: Stenotrophomonas/Psuedomonas Bacteremia First Dose: Yes  Last Day of Therapy: 11/27/21 Labs - Once weekly: CBC/D and BMP Labs - Every other week: ESR and CRP  Method of administration: IV Push Method of administration may be changed at the discretion of home infusion pharmacist based upon assessment of the patient and/or caregiver's ability to self-administer the medication ordered, Disp: 1200 mL, Rfl: 0   lipase/protease/amylase (CREON) 36000 UNITS CPEP capsule, Take 1 capsule (36,000 Units total) by mouth See admin instructions. 36000 units with meals (Patient taking differently: Take 36,000 Units by mouth 3 (three) times daily with meals.), Disp: , Rfl:    loperamide (IMODIUM) 2 MG capsule, Take 4 mg by mouth 2 (two) times daily as needed for diarrhea or loose stools., Disp: , Rfl:    methadone (DOLOPHINE) 5 MG tablet, Take 1 tablet (5 mg total) by mouth 5 (five) times daily., Disp: 30 tablet, Rfl: 0   methocarbamol (ROBAXIN) 500 MG tablet, Take 1 tablet (500 mg total) by mouth in the morning and at bedtime., Disp: 60 tablet, Rfl: 1   Multiple Vitamins-Minerals (MULTIVITAMIN ADULT PO), Take 1 tablet by mouth daily., Disp: , Rfl:    NARCAN 4 MG/0.1ML LIQD nasal spray kit, Place 1 spray into the nose once as needed (overdose)., Disp: , Rfl:    ondansetron (ZOFRAN) 4 MG tablet, Take 4 mg by mouth every 8 (eight) hours as needed., Disp: , Rfl:    polyvinyl alcohol (LIQUIFILM TEARS) 1.4 % ophthalmic solution, Place 1 drop into both eyes as needed for dry eyes., Disp: , Rfl:    PRESCRIPTION MEDICATION, Inject 1 each into the vein daily. Home TPN . [facility] . 1 bag for 12 hours. [phone], Disp: , Rfl:    promethazine (PHENERGAN) 25 MG tablet, TAKE 1 TABLET BY MOUTH EVERY 6 HOURS AS NEEDED FOR NAUSEA FOR VOMITING, Disp: 90  tablet, Rfl: 0   sodium chloride 0.9 % infusion, Inject 1,000 mLs into the vein daily. For chronic dehydration, Disp: , Rfl:    sucralfate (CARAFATE) 1 GM/10ML suspension, Take 10 mLs (1 g total) by mouth 2 (two) times daily., Disp: 420 mL, Rfl: 2   Trace Minerals Cu-Mn-Se-Zn (TRALEMENT IV), Inject 1 mL into the vein See admin instructions. Used in TPN bag 4 times weekly, Disp: , Rfl:    vitamin B-12 (CYANOCOBALAMIN) 1000 MCG tablet, Take 1,000 mcg by mouth daily. , Disp: , Rfl:   Review of Systems:  Constitutional: Denies fever, chills, diaphoresis, appetite change. HEENT: Denies photophobia, eye pain, redness, hearing loss, ear pain, congestion, sore throat, rhinorrhea, sneezing, mouth sores, trouble swallowing, neck pain, neck stiffness and tinnitus.   Respiratory: Denies SOB, DOE, cough, chest tightness,  and wheezing.   Cardiovascular: Denies chest pain, palpitations and leg swelling.  Gastrointestinal: Denies nausea, vomiting, abdominal pain, diarrhea, constipation, blood in stool and abdominal distention.  Genitourinary: Denies dysuria, urgency, frequency, hematuria, flank pain and difficulty urinating.  Endocrine: Denies: hot or cold intolerance, sweats, changes in hair or nails, polyuria, polydipsia. Musculoskeletal: Denies myalgias, back pain, joint swelling, arthralgias and gait problem.  Skin: Denies pallor, rash and wound.  Neurological: Denies dizziness, seizures, syncope,  light-headedness, numbness and headaches.  Hematological: Denies adenopathy. Easy bruising, personal or family bleeding history  Psychiatric/Behavioral: Denies suicidal ideation, mood changes, confusion, nervousness, sleep disturbance and agitation    Physical Exam: Vitals:   11/24/21 1018  BP: 128/60  Pulse: 60  Temp: 98.3 F (36.8 C)  TempSrc: Oral  SpO2: 98%  Weight: 135 lb 3.2 oz (61.3 kg)  Height: 5' 8"  (1.727 m)    Body mass index is 20.56 kg/m.   Constitutional: NAD, calm,  comfortable Eyes: PERRL, lids and conjunctivae normal ENMT: Mucous membranes are moist.  Respiratory: clear to auscultation bilaterally, no wheezing, no crackles. Normal respiratory effort. No accessory muscle use.  Cardiovascular: Regular rate and rhythm, no murmurs / rubs / gallops.  2+ pitting lower extremity edema.  Neurologic: Grossly intact and nonfocal Psychiatric: Normal judgment and insight. Alert and oriented x 3. Normal mood.    Impression and Plan:  Hospital discharge follow-up  Bloodstream infection associated with central venous catheter, subsequent encounter  Severe protein-calorie malnutrition ([place])  Stage 4 chronic kidney disease ([place])  Seizures ([place])  Transaminitis  Crohn's disease of small and large intestines with complication ([place])  Short gut syndrome  [facility] charts have been reviewed in great detail.  I have confirmed that she is doing both Cipro and Levaquin lock as recommended on discharge.  Home health agency will be coming out next week to draw labs to follow her transaminitis as well as to follow her kidney function.  She is feeling back to her baseline.  Is hopeful that she will be able to stay at home longer this time and not require readmission.  I have confirmed with her that she has an appointment with infectious diseases on January 2.  Time spent: 34 minutes reviewing chart, interviewing and examining patient and formulating plan of care  Medical decision making of moderate complexity was utilized today.    [name], MD [place] [facility]

## 2021-11-27 ENCOUNTER — Other Ambulatory Visit: Payer: Self-pay

## 2021-11-27 ENCOUNTER — Emergency Department (HOSPITAL_COMMUNITY): Payer: Medicare Other

## 2021-11-27 ENCOUNTER — Encounter (HOSPITAL_COMMUNITY): Payer: Self-pay

## 2021-11-27 ENCOUNTER — Inpatient Hospital Stay (HOSPITAL_COMMUNITY)
Admission: EM | Admit: 2021-11-27 | Discharge: 2021-12-06 | DRG: 314 | Disposition: A | Discharge status: Home Health | Payer: Medicare Other | Attending: Internal Medicine | Admitting: Internal Medicine

## 2021-11-27 DIAGNOSIS — K50118 Crohn's disease of large intestine with other complication: Secondary | ICD-10-CM | POA: Diagnosis present

## 2021-11-27 DIAGNOSIS — Z681 Body mass index (BMI) 19 or less, adult: Secondary | ICD-10-CM

## 2021-11-27 DIAGNOSIS — Z83511 Family history of glaucoma: Secondary | ICD-10-CM

## 2021-11-27 DIAGNOSIS — K648 Other hemorrhoids: Secondary | ICD-10-CM | POA: Diagnosis present

## 2021-11-27 DIAGNOSIS — K50819 Crohn's disease of both small and large intestine with unspecified complications: Secondary | ICD-10-CM | POA: Diagnosis not present

## 2021-11-27 DIAGNOSIS — F32A Depression, unspecified: Secondary | ICD-10-CM | POA: Diagnosis present

## 2021-11-27 DIAGNOSIS — R7989 Other specified abnormal findings of blood chemistry: Secondary | ICD-10-CM | POA: Diagnosis not present

## 2021-11-27 DIAGNOSIS — G40909 Epilepsy, unspecified, not intractable, without status epilepticus: Secondary | ICD-10-CM | POA: Diagnosis present

## 2021-11-27 DIAGNOSIS — Z79899 Other long term (current) drug therapy: Secondary | ICD-10-CM

## 2021-11-27 DIAGNOSIS — M810 Age-related osteoporosis without current pathological fracture: Secondary | ICD-10-CM | POA: Diagnosis present

## 2021-11-27 DIAGNOSIS — E1122 Type 2 diabetes mellitus with diabetic chronic kidney disease: Secondary | ICD-10-CM | POA: Diagnosis present

## 2021-11-27 DIAGNOSIS — N179 Acute kidney failure, unspecified: Secondary | ICD-10-CM | POA: Diagnosis present

## 2021-11-27 DIAGNOSIS — K912 Postsurgical malabsorption, not elsewhere classified: Secondary | ICD-10-CM | POA: Diagnosis present

## 2021-11-27 DIAGNOSIS — T827XXA Infection and inflammatory reaction due to other cardiac and vascular devices, implants and grafts, initial encounter: Secondary | ICD-10-CM | POA: Diagnosis not present

## 2021-11-27 DIAGNOSIS — D61818 Other pancytopenia: Secondary | ICD-10-CM | POA: Diagnosis present

## 2021-11-27 DIAGNOSIS — Z888 Allergy status to other drugs, medicaments and biological substances status: Secondary | ICD-10-CM

## 2021-11-27 DIAGNOSIS — R1110 Vomiting, unspecified: Secondary | ICD-10-CM

## 2021-11-27 DIAGNOSIS — R7881 Bacteremia: Secondary | ICD-10-CM | POA: Diagnosis present

## 2021-11-27 DIAGNOSIS — E440 Moderate protein-calorie malnutrition: Secondary | ICD-10-CM | POA: Diagnosis present

## 2021-11-27 DIAGNOSIS — K5090 Crohn's disease, unspecified, without complications: Secondary | ICD-10-CM | POA: Diagnosis present

## 2021-11-27 DIAGNOSIS — K529 Noninfective gastroenteritis and colitis, unspecified: Secondary | ICD-10-CM | POA: Diagnosis not present

## 2021-11-27 DIAGNOSIS — Z8249 Family history of ischemic heart disease and other diseases of the circulatory system: Secondary | ICD-10-CM

## 2021-11-27 DIAGNOSIS — Z95828 Presence of other vascular implants and grafts: Secondary | ICD-10-CM | POA: Diagnosis not present

## 2021-11-27 DIAGNOSIS — D509 Iron deficiency anemia, unspecified: Secondary | ICD-10-CM | POA: Diagnosis present

## 2021-11-27 DIAGNOSIS — Z452 Encounter for adjustment and management of vascular access device: Secondary | ICD-10-CM

## 2021-11-27 DIAGNOSIS — R6520 Severe sepsis without septic shock: Secondary | ICD-10-CM

## 2021-11-27 DIAGNOSIS — R109 Unspecified abdominal pain: Secondary | ICD-10-CM | POA: Diagnosis not present

## 2021-11-27 DIAGNOSIS — Q438 Other specified congenital malformations of intestine: Secondary | ICD-10-CM | POA: Diagnosis not present

## 2021-11-27 DIAGNOSIS — Z803 Family history of malignant neoplasm of breast: Secondary | ICD-10-CM

## 2021-11-27 DIAGNOSIS — G894 Chronic pain syndrome: Secondary | ICD-10-CM | POA: Diagnosis present

## 2021-11-27 DIAGNOSIS — F419 Anxiety disorder, unspecified: Secondary | ICD-10-CM | POA: Diagnosis present

## 2021-11-27 DIAGNOSIS — R112 Nausea with vomiting, unspecified: Secondary | ICD-10-CM | POA: Diagnosis present

## 2021-11-27 DIAGNOSIS — Z87891 Personal history of nicotine dependence: Secondary | ICD-10-CM

## 2021-11-27 DIAGNOSIS — T80211A Bloodstream infection due to central venous catheter, initial encounter: Secondary | ICD-10-CM | POA: Diagnosis present

## 2021-11-27 DIAGNOSIS — B377 Candidal sepsis: Secondary | ICD-10-CM | POA: Diagnosis present

## 2021-11-27 DIAGNOSIS — K5010 Crohn's disease of large intestine without complications: Secondary | ICD-10-CM | POA: Diagnosis not present

## 2021-11-27 DIAGNOSIS — I10 Essential (primary) hypertension: Secondary | ICD-10-CM | POA: Diagnosis present

## 2021-11-27 DIAGNOSIS — R6510 Systemic inflammatory response syndrome (SIRS) of non-infectious origin without acute organ dysfunction: Secondary | ICD-10-CM

## 2021-11-27 DIAGNOSIS — Z832 Family history of diseases of the blood and blood-forming organs and certain disorders involving the immune mechanism: Secondary | ICD-10-CM

## 2021-11-27 DIAGNOSIS — Z820 Family history of epilepsy and other diseases of the nervous system: Secondary | ICD-10-CM

## 2021-11-27 DIAGNOSIS — Z20822 Contact with and (suspected) exposure to covid-19: Secondary | ICD-10-CM | POA: Diagnosis present

## 2021-11-27 DIAGNOSIS — Y848 Other medical procedures as the cause of abnormal reaction of the patient, or of later complication, without mention of misadventure at the time of the procedure: Secondary | ICD-10-CM | POA: Diagnosis present

## 2021-11-27 DIAGNOSIS — E538 Deficiency of other specified B group vitamins: Secondary | ICD-10-CM | POA: Diagnosis present

## 2021-11-27 DIAGNOSIS — K50119 Crohn's disease of large intestine with unspecified complications: Secondary | ICD-10-CM | POA: Diagnosis not present

## 2021-11-27 DIAGNOSIS — T80219D Unspecified infection due to central venous catheter, subsequent encounter: Secondary | ICD-10-CM

## 2021-11-27 DIAGNOSIS — K219 Gastro-esophageal reflux disease without esophagitis: Secondary | ICD-10-CM | POA: Diagnosis present

## 2021-11-27 DIAGNOSIS — Z833 Family history of diabetes mellitus: Secondary | ICD-10-CM

## 2021-11-27 DIAGNOSIS — Z79891 Long term (current) use of opiate analgesic: Secondary | ICD-10-CM

## 2021-11-27 DIAGNOSIS — Z9049 Acquired absence of other specified parts of digestive tract: Secondary | ICD-10-CM

## 2021-11-27 DIAGNOSIS — R569 Unspecified convulsions: Secondary | ICD-10-CM

## 2021-11-27 DIAGNOSIS — I129 Hypertensive chronic kidney disease with stage 1 through stage 4 chronic kidney disease, or unspecified chronic kidney disease: Secondary | ICD-10-CM | POA: Diagnosis present

## 2021-11-27 DIAGNOSIS — N1832 Chronic kidney disease, stage 3b: Secondary | ICD-10-CM | POA: Diagnosis present

## 2021-11-27 DIAGNOSIS — D649 Anemia, unspecified: Secondary | ICD-10-CM | POA: Diagnosis present

## 2021-11-27 DIAGNOSIS — T827XXD Infection and inflammatory reaction due to other cardiac and vascular devices, implants and grafts, subsequent encounter: Secondary | ICD-10-CM | POA: Diagnosis not present

## 2021-11-27 DIAGNOSIS — Z800 Family history of malignant neoplasm of digestive organs: Secondary | ICD-10-CM

## 2021-11-27 DIAGNOSIS — R1084 Generalized abdominal pain: Secondary | ICD-10-CM

## 2021-11-27 DIAGNOSIS — A419 Sepsis, unspecified organism: Secondary | ICD-10-CM | POA: Diagnosis present

## 2021-11-27 DIAGNOSIS — K8689 Other specified diseases of pancreas: Secondary | ICD-10-CM | POA: Diagnosis present

## 2021-11-27 DIAGNOSIS — Z885 Allergy status to narcotic agent status: Secondary | ICD-10-CM

## 2021-11-27 LAB — URINALYSIS, ROUTINE W REFLEX MICROSCOPIC
Bilirubin Urine: NEGATIVE
Glucose, UA: NEGATIVE mg/dL
Hgb urine dipstick: NEGATIVE
Ketones, ur: NEGATIVE mg/dL
Nitrite: NEGATIVE
Protein, ur: 100 mg/dL — AB
Specific Gravity, Urine: 1.014 (ref 1.005–1.030)
pH: 7 (ref 5.0–8.0)

## 2021-11-27 LAB — CBC
HCT: 24 % — ABNORMAL LOW (ref 36.0–46.0)
HCT: 25.9 % — ABNORMAL LOW (ref 36.0–46.0)
Hemoglobin: 8 g/dL — ABNORMAL LOW (ref 12.0–15.0)
Hemoglobin: 8.6 g/dL — ABNORMAL LOW (ref 12.0–15.0)
MCH: 31.3 pg (ref 26.0–34.0)
MCH: 30.5 pg (ref 26.0–34.0)
MCHC: 33.3 g/dL (ref 30.0–36.0)
MCHC: 33.2 g/dL (ref 30.0–36.0)
MCV: 93.8 fL (ref 80.0–100.0)
MCV: 91.8 fL (ref 80.0–100.0)
Platelets: 147 10*3/uL — ABNORMAL LOW (ref 150–400)
Platelets: 158 10*3/uL (ref 150–400)
RBC: 2.56 MIL/uL — ABNORMAL LOW (ref 3.87–5.11)
RBC: 2.82 MIL/uL — ABNORMAL LOW (ref 3.87–5.11)
RDW: 13.5 % (ref 11.5–15.5)
RDW: 13.6 % (ref 11.5–15.5)
WBC: 2.9 10*3/uL — ABNORMAL LOW (ref 4.0–10.5)
WBC: 2.5 10*3/uL — ABNORMAL LOW (ref 4.0–10.5)
nRBC: 0 % (ref 0.0–0.2)
nRBC: 0 % (ref 0.0–0.2)

## 2021-11-27 LAB — COMPREHENSIVE METABOLIC PANEL
ALT: 450 U/L — ABNORMAL HIGH (ref 0–44)
AST: 226 U/L — ABNORMAL HIGH (ref 15–41)
Albumin: 2.8 g/dL — ABNORMAL LOW (ref 3.5–5.0)
Alkaline Phosphatase: 469 U/L — ABNORMAL HIGH (ref 38–126)
Anion gap: 5 (ref 5–15)
BUN: 46 mg/dL — ABNORMAL HIGH (ref 6–20)
CO2: 25 mmol/L (ref 22–32)
Calcium: 9.2 mg/dL (ref 8.9–10.3)
Chloride: 104 mmol/L (ref 98–111)
Creatinine, Ser: 2.29 mg/dL — ABNORMAL HIGH (ref 0.44–1.00)
GFR, Estimated: 24 mL/min — ABNORMAL LOW (ref >60)
Glucose, Bld: 87 mg/dL (ref 70–99)
Potassium: 4.7 mmol/L (ref 3.5–5.1)
Sodium: 134 mmol/L — ABNORMAL LOW (ref 135–145)
Total Bilirubin: 1 mg/dL (ref 0.3–1.2)
Total Protein: 6.5 g/dL (ref 6.5–8.1)

## 2021-11-27 LAB — LIPASE, BLOOD: Lipase: 24 U/L (ref 11–51)

## 2021-11-27 LAB — CREATININE, SERUM
Creatinine, Ser: 2.17 mg/dL — ABNORMAL HIGH (ref 0.44–1.00)
GFR, Estimated: 25 mL/min — ABNORMAL LOW (ref >60)

## 2021-11-27 MED ORDER — PANCRELIPASE (LIP-PROT-AMYL) 12000-38000 UNITS PO CPEP
36000.0000 [IU] | ORAL_CAPSULE | Freq: Three times a day (TID) | ORAL | Status: DC
  Administered 2021-11-28 – 2021-12-06 (×16): 36000 [IU] via ORAL
  Filled 2021-11-27 (×18): qty 3

## 2021-11-27 MED ORDER — POLYVINYL ALCOHOL 1.4 % OP SOLN
1.0000 [drp] | Freq: Every day | OPHTHALMIC | Status: DC | PRN
  Filled 2021-11-27: qty 15

## 2021-11-27 MED ORDER — HYDROMORPHONE HCL 1 MG/ML IJ SOLN
1.0000 mg | INTRAMUSCULAR | Status: AC | PRN
  Administered 2021-11-27 – 2021-11-29 (×3): 1 mg via INTRAVENOUS
  Filled 2021-11-27 (×3): qty 1

## 2021-11-27 MED ORDER — HYDRALAZINE HCL 50 MG PO TABS
100.0000 mg | ORAL_TABLET | Freq: Three times a day (TID) | ORAL | Status: DC
  Administered 2021-11-27 – 2021-12-06 (×26): 100 mg via ORAL
  Filled 2021-11-27 (×26): qty 2

## 2021-11-27 MED ORDER — BUPROPION HCL ER (SR) 100 MG PO TB12
200.0000 mg | ORAL_TABLET | Freq: Every day | ORAL | Status: DC
  Administered 2021-11-27 – 2021-12-06 (×10): 200 mg via ORAL
  Filled 2021-11-27 (×10): qty 2

## 2021-11-27 MED ORDER — ONDANSETRON 4 MG PO TBDP
4.0000 mg | ORAL_TABLET | Freq: Once | ORAL | Status: AC | PRN
  Administered 2021-11-27: 4 mg via ORAL
  Filled 2021-11-27: qty 1

## 2021-11-27 MED ORDER — DULOXETINE HCL 60 MG PO CPEP
120.0000 mg | ORAL_CAPSULE | Freq: Every day | ORAL | Status: DC
Start: 2021-11-27 — End: 2021-12-07
  Administered 2021-11-27 – 2021-12-06 (×10): 120 mg via ORAL
  Filled 2021-11-27 (×10): qty 2

## 2021-11-27 MED ORDER — HEPARIN SODIUM (PORCINE) 5000 UNIT/ML IJ SOLN
5000.0000 [IU] | Freq: Three times a day (TID) | INTRAMUSCULAR | Status: DC
  Administered 2021-11-28: 5000 [IU] via SUBCUTANEOUS
  Filled 2021-11-27 (×3): qty 1

## 2021-11-27 MED ORDER — CYCLOSPORINE 0.05 % OP EMUL
1.0000 [drp] | Freq: Two times a day (BID) | OPHTHALMIC | Status: DC
  Administered 2021-11-27 – 2021-12-06 (×18): 1 [drp] via OPHTHALMIC
  Filled 2021-11-27 (×19): qty 30

## 2021-11-27 MED ORDER — AMLODIPINE BESYLATE 10 MG PO TABS
10.0000 mg | ORAL_TABLET | Freq: Every day | ORAL | Status: DC
Start: 2021-11-27 — End: 2021-12-07
  Administered 2021-11-27 – 2021-12-06 (×10): 10 mg via ORAL
  Filled 2021-11-27 (×10): qty 1

## 2021-11-27 MED ORDER — HYDROMORPHONE HCL 1 MG/ML IJ SOLN
0.5000 mg | INTRAMUSCULAR | Status: DC | PRN
  Administered 2021-11-28 – 2021-12-06 (×42): 1 mg via INTRAVENOUS
  Filled 2021-11-27 (×43): qty 1

## 2021-11-27 MED ORDER — SUCRALFATE 1 GM/10ML PO SUSP
1.0000 g | Freq: Two times a day (BID) | ORAL | Status: DC
  Administered 2021-11-28 – 2021-12-06 (×17): 1 g via ORAL
  Filled 2021-11-27 (×17): qty 10

## 2021-11-27 MED ORDER — ADULT MULTIVITAMIN W/MINERALS CH
ORAL_TABLET | Freq: Every day | ORAL | Status: DC
  Administered 2021-11-30 – 2021-12-06 (×3): 1 via ORAL
  Filled 2021-11-27 (×10): qty 1

## 2021-11-27 MED ORDER — ONDANSETRON HCL 4 MG PO TABS: 4.0000 mg | ORAL_TABLET | Freq: Four times a day (QID) | ORAL | Status: DC | PRN

## 2021-11-27 MED ORDER — HYDROMORPHONE HCL 2 MG PO TABS
4.0000 mg | ORAL_TABLET | Freq: Four times a day (QID) | ORAL | Status: DC
  Administered 2021-11-27 – 2021-12-06 (×32): 4 mg via ORAL
  Filled 2021-11-27 (×36): qty 2

## 2021-11-27 MED ORDER — METHADONE HCL 10 MG PO TABS
5.0000 mg | ORAL_TABLET | Freq: Every day | ORAL | Status: DC
  Administered 2021-11-27 – 2021-12-06 (×40): 5 mg via ORAL
  Filled 2021-11-27 (×43): qty 1

## 2021-11-27 MED ORDER — ONDANSETRON HCL 4 MG/2ML IJ SOLN
4.0000 mg | Freq: Four times a day (QID) | INTRAMUSCULAR | Status: DC | PRN
  Administered 2021-11-27 – 2021-12-06 (×15): 4 mg via INTRAVENOUS
  Filled 2021-11-27 (×15): qty 2

## 2021-11-27 MED ORDER — LACTATED RINGERS IV BOLUS
500.0000 mL | Freq: Once | INTRAVENOUS | Status: AC
  Administered 2021-11-27: 500 mL via INTRAVENOUS

## 2021-11-27 MED ORDER — TEDUGLUTIDE (RDNA) 5 MG ~~LOC~~ KIT
1.5000 mg | PACK | Freq: Every day | SUBCUTANEOUS | Status: DC
  Administered 2021-11-28 – 2021-12-06 (×9): 1.5 mg via SUBCUTANEOUS
  Filled 2021-11-27: qty 1

## 2021-11-27 MED ORDER — CARVEDILOL 12.5 MG PO TABS
12.5000 mg | ORAL_TABLET | Freq: Two times a day (BID) | ORAL | Status: DC
  Administered 2021-11-28 – 2021-12-06 (×18): 12.5 mg via ORAL
  Filled 2021-11-27 (×18): qty 1

## 2021-11-27 MED ORDER — PANTOPRAZOLE SODIUM 40 MG IV SOLR
40.0000 mg | INTRAVENOUS | Status: DC
  Administered 2021-11-27 – 2021-11-29 (×3): 40 mg via INTRAVENOUS
  Filled 2021-11-27 (×3): qty 40

## 2021-11-27 MED ORDER — VITAMIN D 25 MCG (1000 UNIT) PO TABS
1000.0000 [IU] | ORAL_TABLET | Freq: Every day | ORAL | Status: DC
  Administered 2021-11-28 – 2021-12-01 (×3): 1000 [IU] via ORAL
  Filled 2021-11-27 (×11): qty 1

## 2021-11-27 MED ORDER — ESTRADIOL 1 MG PO TABS
2.0000 mg | ORAL_TABLET | Freq: Every day | ORAL | Status: DC
  Administered 2021-11-27 – 2021-12-06 (×10): 2 mg via ORAL
  Filled 2021-11-27 (×10): qty 2

## 2021-11-27 MED ORDER — LEVETIRACETAM 500 MG PO TABS
500.0000 mg | ORAL_TABLET | Freq: Two times a day (BID) | ORAL | Status: DC
  Administered 2021-11-27 – 2021-12-06 (×18): 500 mg via ORAL
  Filled 2021-11-27 (×18): qty 1

## 2021-11-27 MED ORDER — METHOCARBAMOL 500 MG PO TABS
500.0000 mg | ORAL_TABLET | Freq: Two times a day (BID) | ORAL | Status: DC
  Administered 2021-11-27 – 2021-12-05 (×16): 500 mg via ORAL
  Filled 2021-11-27 (×18): qty 1

## 2021-11-27 MED ORDER — IOHEXOL 9 MG/ML PO SOLN
1000.0000 mL | ORAL | Status: AC
  Administered 2021-11-27: 1000 mL via ORAL

## 2021-11-27 MED ORDER — LEVOFLOXACIN IN D5W 500 MG/100ML IV SOLN
500.0000 mg | Freq: Once | INTRAVENOUS | Status: AC
  Administered 2021-11-27: 500 mg via INTRAVENOUS
  Filled 2021-11-27: qty 100

## 2021-11-27 MED ORDER — PANTOPRAZOLE SODIUM 40 MG PO TBEC
40.0000 mg | DELAYED_RELEASE_TABLET | Freq: Every day | ORAL | Status: DC
  Administered 2021-11-27 – 2021-12-06 (×10): 40 mg via ORAL
  Filled 2021-11-27 (×10): qty 1

## 2021-11-27 MED ORDER — CALCIUM CARBONATE 1250 (500 CA) MG PO TABS
1250.0000 mg | ORAL_TABLET | Freq: Every day | ORAL | Status: DC
  Administered 2021-11-30 – 2021-12-01 (×2): 1250 mg via ORAL
  Filled 2021-11-27 (×9): qty 1

## 2021-11-27 MED ORDER — LACTATED RINGERS IV SOLN: INTRAVENOUS | Status: DC

## 2021-11-27 MED ORDER — VITAMIN B-12 1000 MCG PO TABS
1000.0000 ug | ORAL_TABLET | Freq: Every day | ORAL | Status: DC
  Administered 2021-11-28 – 2021-12-06 (×5): 1000 ug via ORAL
  Filled 2021-11-27 (×11): qty 1

## 2021-11-27 MED ORDER — HYDROMORPHONE HCL 1 MG/ML IJ SOLN
1.0000 mg | Freq: Once | INTRAMUSCULAR | Status: AC
  Administered 2021-11-27: 1 mg via INTRAVENOUS
  Filled 2021-11-27: qty 1

## 2021-11-27 NOTE — Assessment & Plan Note (Addendum)
Continue Keppra.

## 2021-11-27 NOTE — Assessment & Plan Note (Addendum)
-  Continue teduglutide -TPN held secondary to line holiday; restarting TPN pending PICC placement

## 2021-11-27 NOTE — Assessment & Plan Note (Addendum)
Hx crohn's with prior resection. No active flare.

## 2021-11-27 NOTE — Assessment & Plan Note (Addendum)
-  Continue B12 supplementation

## 2021-11-27 NOTE — H&P (Addendum)
History and Physical    [name] DSK:[id number] DOB: 1961/10/27 DOA: 11/27/2021  PCP: [name], [name], MD  Patient coming from: home  I have personally briefly reviewed patient's old medical records in [place]  Chief Complaint: abdominal pain, fever  HPI: Caitlin Peters is Caitlin Peters 61 y.o. female with medical history significant of crohn's disease s/p multiple resection c/b short gut syndrome and severe malnutrition on chronic TPN since 2003, poor peripheral access with right thigh picc line, stage IV CKD, anxiety and depression, seizure disorder, hypertension, recurrent CLABSI who presented with abdominal discomfort, vomiting, fever which is concerning to her for Jameela Michna crohn's flare.  Of note, she was recently hospitalized with fever and abdominal pain, found to have stenotrophomonas maltophilia and pseudomonas putida CLABSI.  She was discharged on levaqin and cipro intracatheter lock with plans to continue until 11/27/2021 (today).  She began to develop abdominal pain, nausea, vomiting (clear emesis) for the past 3 days.  She notes this feels similar to crohn's flares.  Noted fever to 100.7, chills, and headache.  Also noticed decreased appetite.  Denies CP or SOB.  Denies smoking, etoh.  Her primary gastroenterologist is at [facility], Dr. Domenica Fail.   ED Course: She was seen in the ED, noted to have AKI and colitis on imaging.  Admitted to hospitalist for further management.  Review of Systems: As per HPI otherwise all other systems reviewed and are negative.  Past Medical History:  Diagnosis Date   Acute pancreatitis 04/13/2020   Anasarca 10/2019   AVN (avascular necrosis of bone) ([name])    Cataract    Choledocholithiasis (sludge) s/p ERCP 10/2019 10/21/2020   Chronic pain syndrome    CKD (chronic kidney disease), stage III ([name])    Depression    Diverticulosis    GERD (gastroesophageal reflux disease)    HTN (hypertension)    IDA (iron deficiency anemia)    Malnutrition  ([place])    Mass in chest    Osteoporosis 12/24/2014   Pancreatitis    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; 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; jejunal to transverse colon anastomosis Treated at [facility] lengthening to 165cm Dr Alene Mires, [facility]   Vitamin B12 deficiency     Past Surgical History:  Procedure Laterality Date   ABDOMINAL ADHESION SURGERY  01/22/2018   APPENDECTOMY  1989   BILIARY DILATION  11/26/2019   Procedure: BILIARY DILATION;  Surgeon: Jackquline Denmark, MD;  Location: [facility];  Service: Endoscopy;;   BILIARY DILATION  03/08/2020   Procedure: BILIARY DILATION;  Surgeon: Irving Copas., MD;  Location: [place];  Service: Gastroenterology;;   BIOPSY  03/08/2020   Procedure: BIOPSY;  Surgeon: Irving Copas., MD;  Location: [facility] ENDOSCOPY;  Service: Gastroenterology;;   CHEST WALL RESECTION     right thoracotomy,resection of chest mass with anterior rib and reconstruction using prosthetic mesh and video arthroscopy   CHOLECYSTECTOMY  01/22/2018   COLONOSCOPY  2019   ENTEROSTOMY CLOSURE  04/1999   ERCP N/Nielle Duford 11/26/2019   Procedure: ENDOSCOPIC RETROGRADE CHOLANGIOPANCREATOGRAPHY (ERCP);  Surgeon: Jackquline Denmark, MD;  Location: [facility];  Service: Endoscopy;  Laterality: N/Anayelli Lai;   ERCP N/Burke Terry 03/08/2020   Procedure: ENDOSCOPIC RETROGRADE CHOLANGIOPANCREATOGRAPHY (ERCP);  Surgeon: Irving Copas., MD;  Location: [place];  Service: Gastroenterology;  Laterality: N/Tovia Kisner;   ESOPHAGOGASTRODUODENOSCOPY N/Marillyn Goren 03/08/2020   Procedure: ESOPHAGOGASTRODUODENOSCOPY (EGD);  Surgeon: Irving Copas., MD;  Location: [place];  Service: Gastroenterology;  Laterality: N/Mertie Haslem;   EUS N/Romelia Bromell 03/08/2020   Procedure: UPPER ENDOSCOPIC ULTRASOUND (EUS) LINEAR;  Surgeon: Irving Copas., MD;  Location: [place];  Service: Gastroenterology;  Laterality:  N/Staphanie Harbison;   ILEOCECETOMY  03/1999   ileocolon resection with abdominal stoma   ILEOSTOMY CLOSURE  2001   IR FLUORO GUIDE CV LINE LEFT  01/07/2020   IR FLUORO GUIDE CV LINE LEFT  03/09/2020   IR FLUORO GUIDE CV LINE LEFT  05/09/2020   IR FLUORO GUIDE CV LINE LEFT  07/20/2020   IR FLUORO GUIDE CV LINE RIGHT  08/05/2020   IR FLUORO GUIDE CV LINE RIGHT  04/03/2021   IR PTA VENOUS EXCEPT DIALYSIS CIRCUIT  01/07/2020   IR REMOVAL TUN CV CATH W/O FL  08/05/2020   IR REMOVAL TUN CV CATH W/O FL  07/11/2021   IR US GUIDE VASC ACCESS LEFT     x 2 06/17/19 and 09/14/2019   IR US GUIDE VASC ACCESS RIGHT  08/05/2020   KNEE SURGERY     right knee    LAPAROSCOPIC SMALL BOWEL RESECTION  2009   2000-2009.  SB resections for Crohns Disease - now with Short gut   OMENTECTOMY  01/22/2018   PARTIAL HYSTERECTOMY  1984   with LSO   REMOVAL OF STONES  11/26/2019   Procedure: REMOVAL OF STONES;  Surgeon: Jackquline Denmark, MD;  Location: [facility];  Service: Endoscopy;;   REMOVAL OF STONES  03/08/2020   Procedure: REMOVAL OF STONES;  Surgeon: Irving Copas., MD;  Location[facility] ENDOSCOPY;  Service: Gastroenterology;;   SALPINGOOPHORECTOMY Left 1984   SALPINGOOPHORECTOMY Right 1990   SERIAL TRANSVERSE ENTEROPLASTY (STEP) - SMALL BOWEL LENGTHENING  01/22/2018   Dr Alene Mires, [facility] - SB length from 120 to 165cm    SMALL INTESTINE SURGERY  2002   SMALL INTESTINE SURGERY  2003   SPHINCTEROTOMY  11/26/2019   Procedure: SPHINCTEROTOMY;  Surgeon: Jackquline Denmark, MD;  Location: [facility];  Service: Endoscopy;;   TOTAL ABDOMINAL HYSTERECTOMY  1990   with RSO   UPPER GASTROINTESTINAL ENDOSCOPY      Social History  reports that she has quit smoking. Her smoking use included cigarettes. She has Caitlin Peters 17.50 pack-year smoking history. She has never used smokeless tobacco. She reports that she does not currently use alcohol. She reports that she does not use drugs.  Allergies  Allergen Reactions   Meperidine Hives     Other reaction(s): GI Upset Due to [place]    Hyoscyamine Hives and Swelling    Legs swelling Disorientation   Cefepime Other (See Comments)    Neurotoxicity occurring in setting of AKI. Ceftriaxone tolerated during same admit   Gabapentin Other (See Comments)    unknown   Lyrica [Pregabalin] Other (See Comments)    Has chronic dehydration and made it worse   Topamax [Topiramate] Other (See Comments)    Has chronic dehydration and made it worse   Zosyn [Piperacillin Sod-Tazobactam So] Other (See Comments)    Patient reports it makes her vomit, her neck stiff, and her "heart feel funny" Affected kidneys   Fentanyl Rash    Pt is allergic to fentanyl patch related to the glue (gives her Loris Seelye rash) Pt states she is NOT allergic to fentanyl IV medicine   Morphine And Related Rash    Family History  Problem Relation Age of Onset   Seizures Mother    Glaucoma Mother  CAD Father    Heart disease Father    Hypertension Father    Breast cancer Sister    Multiple sclerosis Sister    Diabetes Sister    Lupus Sister    Colon cancer Other    Crohn's disease Other     Prior to Admission medications   Medication Sig Start Date End Date Taking? Authorizing Provider  acetaminophen (TYLENOL) 650 MG CR tablet Take 1,300 mg by mouth every 6 (six) hours.   Yes [provider]  amLODipine (NORVASC) 10 MG tablet Take 1 tablet by mouth once daily Patient taking differently: Take 10 mg by mouth daily. 08/22/21  Yes [name], [name], MD  buPROPion ER [facility] SR) 100 MG 12 hr tablet Take 2 tablets by mouth once daily Patient taking differently: Take 200 mg by mouth daily. 08/30/21  Yes [name], [name], MD  Calcium 500 MG tablet Take 500 mg by mouth daily.   Yes [provider]  carvedilol (COREG) 12.5 MG tablet Take 1 tablet (12.5 mg total) by mouth 2 (two) times daily with Jil Penland meal. 09/29/21  Yes Little Ishikawa, MD  cholecalciferol (VITAMIN D3) 25 MCG  (1000 UT) tablet Take 1,000 Units by mouth daily.    Yes [provider]  ciprofloxacin (CIPRO) 400 MG/200ML SOLN Inject 400 mg into the vein daily. 11/16/21  Yes [provider]  cycloSPORINE (RESTASIS) 0.05 % ophthalmic emulsion Place 1 drop into both eyes 2 (two) times daily.   Yes [provider]  DEXILANT 60 MG capsule Take 1 capsule by mouth once daily 11/21/21  Yes [name], [name], MD  diphenoxylate-atropine (LOMOTIL) 2.5-0.025 MG tablet Take 1 tablet by mouth 2 (two) times daily as needed for diarrhea or loose stools. 09/04/21  Yes [provider]  DULoxetine (CYMBALTA) 60 MG capsule Take 2 capsules (120 mg total) by mouth daily. 09/15/21  Yes [name], [name], MD  estradiol (ESTRACE) 2 MG tablet Take 1 tablet by mouth once daily Patient taking differently: Take 2 mg by mouth daily. 10/03/21  Yes [name], [name], MD  famotidine (PEPCID) 20 MG tablet Take 20 mg by mouth daily as needed for heartburn or indigestion.   Yes [provider]  GATTEX 5 MG KIT Inject 1.5 mg into the skin daily. 01/03/21  Yes [provider]  hydrALAZINE (APRESOLINE) 100 MG tablet Take 1 tablet (100 mg total) by mouth 3 (three) times daily. 11/07/21  Yes [name], [name], MD  HYDROmorphone (DILAUDID) 4 MG tablet Take 4 mg by mouth 4 (four) times daily. 04/18/21  Yes [provider]  levETIRAcetam (KEPPRA) 500 MG tablet Take 1 tablet (500 mg total) by mouth 2 (two) times daily. 08/28/21  Yes Cameron Sprang, MD  levofloxacin (LEVAQUIN) 500 MG/100ML SOLN Inject 100 mLs (500 mg total) into the vein daily for 12 days. Indication: Stenotrophomonas/Psuedomonas Bacteremia First Dose: Yes  Last Day of Therapy: 11/27/21 Labs - Once weekly: CBC/D and BMP Labs - Every other week: ESR and CRP  Method of administration: IV Push Method of administration may be changed at the discretion of home infusion pharmacist based upon  assessment of the patient and/or caregiver's ability to self-administer the medication ordered 11/16/21 11/28/21 Yes Bonnielee Haff, MD  lipase/protease/amylase (CREON) 36000 UNITS CPEP capsule Take 1 capsule (36,000 Units total) by mouth See admin instructions. 36000 units with meals Patient taking differently: Take 36,000 Units by mouth 3 (three) times daily with meals. 06/30/21  Yes Eugenie Filler, MD  loperamide (IMODIUM) 2 MG capsule Take 4 mg by mouth 2 (two) times daily as needed for diarrhea or loose stools. 09/26/21  Yes [provider]  methadone (DOLOPHINE) 5 MG tablet Take 1 tablet (5 mg total) by mouth 5 (five) times daily. 09/29/21  Yes Little Ishikawa, MD  methocarbamol (ROBAXIN) 500 MG tablet Take 1 tablet (500 mg total) by mouth in the morning and at bedtime. 06/13/21  Yes [name], [name], MD  Multiple Vitamins-Minerals (MULTIVITAMIN ADULT PO) Take 1 tablet by mouth daily.   Yes [provider]  NARCAN 4 MG/0.1ML LIQD nasal spray kit Place 1 spray into the nose once as needed (overdose). 10/14/20  Yes [provider]  ondansetron (ZOFRAN) 4 MG tablet Take 4 mg by mouth every 8 (eight) hours as needed for nausea or vomiting. 11/18/21  Yes [provider]  polyvinyl alcohol (LIQUIFILM TEARS) 1.4 % ophthalmic solution Place 1 drop into both eyes daily as needed for dry eyes.   Yes [provider]  PRESCRIPTION MEDICATION Inject 1 each into the vein daily. Home TPN . [facility] . 1 bag for 12 hours. [phone]   Yes [provider]  promethazine (PHENERGAN) 25 MG tablet TAKE 1 TABLET BY MOUTH EVERY 6 HOURS AS NEEDED FOR NAUSEA FOR VOMITING Patient taking differently: Take 25 mg by mouth every 8 (eight) hours as needed for vomiting or nausea. 11/16/21  Yes [name], [name], MD  sodium chloride 0.9 % infusion Inject 1,000 mLs into the vein daily. For chronic dehydration 10/16/20  Yes  [provider]  sucralfate (CARAFATE) 1 GM/10ML suspension Take 10 mLs (1 g total) by mouth 2 (two) times daily. 06/30/21  Yes Eugenie Filler, MD  Trace Minerals Cu-Mn-Se-Zn (TRALEMENT IV) Inject 1 mL into the vein See admin instructions. Used in TPN bag 4 times weekly   Yes [provider]  vitamin B-12 (CYANOCOBALAMIN) 1000 MCG tablet Take 1,000 mcg by mouth daily.    Yes [provider]  ciprofloxacin 0.2 mg/ml + heparin 5000 units/ml SOLN 2.5 mLs by Intracatheter route daily for 12 days. Dwell in PICC lumen #2 for Scottlyn Mchaney minimum of 2 hours daily when PICC not in use End date 11/27/21 Patient not taking: Reported on 11/27/2021 11/16/21 11/28/21  Bonnielee Haff, MD  ciprofloxacin 0.2 mg/ml + heparin 5000 units/ml SOLN 2.5 mLs by Intracatheter route daily for 11 days. Dwell in PICC lumen #1 for Ryian Lynde minimum of 2 hours daily when PICC not in use. End 11/27/21 Patient not taking: Reported on 11/27/2021 11/16/21 11/27/21  Bonnielee Haff, MD    Physical Exam: Vitals:   11/27/21 1338 11/27/21 1400 11/27/21 1430 11/27/21 1606  BP: (!) 179/74 (!) 197/78 (!) 162/89 (!) 175/67  Pulse: 66 72 70 68  Resp: 18 18 18 18   Temp: 98.5 F (36.9 C)   98.6 F (37 C)  TempSrc: Oral   Oral  SpO2: 100% 100% 99% 100%  Weight:      Height:        Constitutional: sitting up in stretcher, appears uncomfortable Vitals:   11/27/21 1338 11/27/21 1400 11/27/21 1430 11/27/21 1606  BP: (!) 179/74 (!) 197/78 (!) 162/89 (!) 175/67  Pulse: 66 72 70 68  Resp: 18 18 18 18   Temp: 98.5 F (36.9 C)   98.6 F (37 C)  TempSrc: Oral   Oral  SpO2: 100% 100% 99% 100%  Weight:  Height:       Eyes: PERRL, lids and conjunctivae normal ENMT: Mucous membranes are moist.   Neck: normal, supple Respiratory: unlabored Cardiovascular: RRR.  R thigh picc without notable signs of infection Abdomen: diffusely tender, voluntary guarding Musculoskeletal: no clubbing / cyanosis. No joint deformity upper and lower  extremities. Good ROM, no contractures. Normal muscle tone.  Skin: no rashes, lesions, ulcers. No induration Neurologic: CN 2-12 grossly intact. Moving all extremities Psychiatric: Normal judgment and insight. Alert and oriented x 3. Normal mood.   Labs on Admission: I have personally reviewed following labs and imaging studies  CBC: Recent Labs  Lab 11/27/21 1220  WBC 2.9*  HGB 8.0*  HCT 24.0*  MCV 93.8  PLT 147*    Basic Metabolic Panel: Recent Labs  Lab 11/27/21 1220  NA 134*  K 4.7  CL 104  CO2 25  GLUCOSE 87  BUN 46*  CREATININE 2.29*  CALCIUM 9.2    GFR: Estimated Creatinine Clearance: 25.2 mL/min (Lavoris Sparling) (by C-G formula based on SCr of 2.29 mg/dL (H)).  Liver Function Tests: Recent Labs  Lab 11/27/21 1220  AST 226*  ALT 450*  ALKPHOS 469*  BILITOT 1.0  PROT 6.5  ALBUMIN 2.8*    Urine analysis:    Component Value Date/Time   COLORURINE YELLOW 11/27/2021 1231   APPEARANCEUR HAZY (Jayro Mcmath) 11/27/2021 1231   LABSPEC 1.014 11/27/2021 1231   PHURINE 7.0 11/27/2021 1231   GLUCOSEU NEGATIVE 11/27/2021 1231   HGBUR NEGATIVE 11/27/2021 1231   [place] 11/27/2021 1231   KETONESUR NEGATIVE 11/27/2021 1231   PROTEINUR 100 (Attikus Bartoszek) 11/27/2021 1231   NITRITE NEGATIVE 11/27/2021 1231   LEUKOCYTESUR TRACE (Dasan Hardman) 11/27/2021 1231    Radiological Exams on Admission: CT ABDOMEN PELVIS WO CONTRAST  Result Date: 11/27/2021 CLINICAL DATA:  Generalized abdominal pain, history of Crohn disease, recent liver biopsy EXAM: CT ABDOMEN AND PELVIS WITHOUT CONTRAST TECHNIQUE: Multidetector CT imaging of the abdomen and pelvis was performed following the standard protocol without IV contrast. Unenhanced CT was performed per clinician order. Lack of IV contrast limits sensitivity and specificity, especially for evaluation of abdominal/pelvic solid viscera. COMPARISON:  11/11/2021, 11/11/2020 FINDINGS: Lower chest: No acute pleural or parenchymal lung disease. Hepatobiliary: Biliary  dilation and pneumobilia are again identified, with high attenuation material within the intrahepatic and extrahepatic bile ducts likely refluxed oral contrast suggesting prior sphincterotomy or incompetent sphincter. Otherwise unremarkable unenhanced appearance of the liver. Gallbladder is absent. Pancreas: Unremarkable unenhanced appearance. Spleen: Unremarkable unenhanced appearance. Adrenals/Urinary Tract: No urinary tract calculi or obstructive uropathy. The adrenals are difficult to appreciate on this unenhanced exam. Bladder is unremarkable. Stomach/Bowel: Evaluation is limited without IV contrast. Oral contrast has traversed into the colon by the time of imaging. Multiple enterotomy staple lines are again seen within the mid abdomen. There is wall thickening of the distal colon, extending from the splenic flexure through the rectosigmoid junction. Scattered colonic gas fluid levels are noted. Vascular/Lymphatic: Right femoral PICC again identified, tip at the level of the hepatic veins. No other significant vascular finding on this limited unenhanced exam. No discrete adenopathy. Reproductive: Status post hysterectomy. No adnexal masses. Other: No free fluid or free intraperitoneal gas. No abdominal wall hernia. Musculoskeletal: No acute or destructive bony lesions. Reconstructed images demonstrate no additional findings. IMPRESSION: 1. Distal colonic wall thickening consistent with inflammatory or infectious colitis. 2. No evidence of bowel obstruction or ileus. Scattered distal colonic gas fluid levels may reflect diarrhea or recent laxative/enema use. 3. Stable biliary duct  dilation, with increased pneumobilia and reflux of oral contrast into the biliary tree. This likely reflects previous sphincterotomy. 4. Right femoral PICC, tip at level of the hepatic veins. Electronically Signed   By: Randa Ngo M.D.   On: 11/27/2021 15:58   DG Chest Port 1 View  Result Date: 11/27/2021 CLINICAL DATA:  Sixty  fever EXAM: PORTABLE CHEST 1 VIEW COMPARISON:  October 07, 2021 FINDINGS: The heart size and mediastinal contours are stable. Both lungs are clear. The visualized skeletal structures are unremarkable. IMPRESSION: No active disease. Electronically Signed   By: Abelardo Diesel M.D.   On: 11/27/2021 14:14    EKG: Independently reviewed. pending  Assessment/Plan Principal Problem:   Colitis Active Problems:   AKI (acute kidney injury) ([place])   SIRS (systemic inflammatory response syndrome) ([name])   Abdominal pain   Bacteremia associated with intravascular line ([place])   PICC (peripherally inserted central catheter) in place   LFTs abnormal   Crohn's disease ([place])   Short gut syndrome   Anemia   Hypertension   Chronic pain syndrome   Vitamin B12 deficiency   Pancreatic insufficiency   [name]   Depression    * Colitis- (present on admission) Presented with sx concerning for crohn's flare CT with distal colonic wall thickening c/w inflammatory or infectious colitis Follow GI path panel/C diff She's been on levaquin outpatient for issues below, will hold on additional abx for now Will consult GI for additional assistance (holding on steroids for now with pending cultures, recent hx infection, they'll see in AM) She has Keisha Amer gastroenterologist at [facility] (Dr. Domenica Fail) who is her primary (last in person visit 09/19/2021 - care everywhere) - looks like she has been off budesonide since Summer 2022 by this note  SIRS (systemic inflammatory response syndrome) ([name]) Leukopenia + reported fever at home Concern for colitis on imaging, suspected crohn's flare Hx recurrent catheter associated infections, follow blood cultures C diff, gi path panel  AKI (acute kidney injury) ([place])- (present on admission) Likely prerenal related to poor PO intake with suspected crohn's flare CT without hydro  Follow with IVF Baseline creatinine around 1.5, presented with creatinine 2.29   Abdominal pain- (present  on admission) Due to above  Bacteremia associated with intravascular line ([place])- (present on admission) 1/4 blood cx from previous hospitalization with stenotrophomonas maltophilia and pseudomonas putida She was discharged with levaquin 500 mg daily and ciprofloxacin lock at least 2 hrs daily x2 weeks with end date 11/27/2021 (which is today) Will follow up with ID for additional abx recs, hold further abx at this time Follow repeat blood cx from today  PICC (peripherally inserted central catheter) in place Limited IV access RLE picc  LFTs abnormal- (present on admission) Had liver bx 12/20 with minimal to mild nonspecific portal inflammation and 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 -> per comments, ddx for pale intracytoplasmic inclusions includes mild drug induced injury, polypharmacotherapy, chronic hep B, T2DM, GVHD, and rare reports have shown slight inclusions in patients on chronic parenteral nutrition for short bowel syndrome LFT's up today, trend with IVF CT with biliary dilation and pneumobilia, absent gallbladder, unremarkable unenhanced appearance of liver  Short gut syndrome- (present on admission) gattex  Crohn's disease ([place]) Hx crohn's with prior resection  Chronic pain syndrome- (present on admission) Continue home meds (methadone, dilaudid PO) with prn IV breakthrough (PDMP reviewed)  Hypertension- (present on admission) Amlodipine, coreg, hydralazine  Vitamin B12 deficiency- (present on admission) b12 supplementation  Pancreatic insufficiency creon  Seizures (  [place]) keppra  Depression- (present on admission) Wellbutrin, cymbalta    DVT prophylaxis: heparin  Code Status:   full  Family Communication:  none  Disposition Plan:   Patient is from:  home  Anticipated DC to:  home  Anticipated DC date:  48-72 hrs  Anticipated DC barriers: Pending further improvement, GI, ID c/s  Consults called:  GI pending, will call ID in AM   Admission status:  inpatient   Severity of Illness: The appropriate patient status for this patient is INPATIENT. Inpatient status is judged to be reasonable and necessary in order to provide the required intensity of service to ensure the patient's safety. [name] presenting symptoms, physical exam findings, and initial radiographic and laboratory data in the context of their chronic comorbidities is felt to place them at high risk for further clinical deterioration. Furthermore, it is not anticipated that the patient will be medically stable for discharge from the hospital within 2 midnights of admission.   * I certify that at the point of admission it is my clinical judgment that the patient will require inpatient hospital care spanning beyond 2 midnights from the point of admission due to high intensity of service, high risk for further deterioration and high frequency of surveillance required.Fayrene Helper MD [facility]  How to contact the [facility] Attending or Consulting provider [place] or covering provider during after hours [place], for this patient?   Check the care team in [facility] and look for Adaleen Hulgan) attending/consulting [facility] provider listed and b) the [facility] team listed Log into [email or web address] and use [place]'s universal password to access. If you do not have the password, please contact the hospital operator. Locate the [facility] provider you are looking for under [facility] and page to Ariya Bohannon number that you can be directly reached. If you still have difficulty reaching the provider, please page the [facility] (Director on Call) for the Hospitalists listed on amion for assistance.  11/27/2021, 6:40 PM

## 2021-11-27 NOTE — ED Notes (Signed)
Requesting blood drawn with IV insertion when in a room

## 2021-11-27 NOTE — Assessment & Plan Note (Addendum)
-  Continue Amlodipine, Coreg and hydralazine

## 2021-11-27 NOTE — Assessment & Plan Note (Addendum)
Patient treated on previous admission and completed antibiotic course on 11/27/21. At that time, blood culture positive for stenotrophomonas maltophilia and pseudomonas putida. Repeat blood culture this admission shows treatment of bacteremia.

## 2021-11-27 NOTE — ED Notes (Signed)
Pt mild distress in bed, a/ox4. Pt states she has chronic UC, chrohns and short gut and has been having ABD pain again. Pt states she was just released a couple of days ago from this hospital. ABD soft and tender. Denies n/v, fever, CP, SOB.

## 2021-11-27 NOTE — ED Notes (Signed)
ED Provider at bedside. 

## 2021-11-27 NOTE — Progress Notes (Signed)
PHARMACY - TOTAL PARENTERAL NUTRITION CONSULT NOTE   Indication:  short bowel syndrome,  chronic TPN  Patient Measurements: Height: _0  (172.7 cm) Weight: 61.2 kg (135 lb) IBW/kg (Calculated) : 63.9 TPN AdjBW (KG): 61.2 Body mass index is 20.53 kg/m.  Assessment: 61 year old female presenting with abdominal pain. Recently admitted to [facility], found to have Stenotrophomonas and Pseudomonas putida bacteremia - discharged on 12/22 with IV Levaquin and Ciprofloxacin antibiotic lock solution.  PICC line change most recently on 12/15 with IR.  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  Pharmacy has been consulted to resume TPN.  Glucose / Insulin: BG < 100, patient has not required insulin during past admissions Electrolytes: Na 134, other elytes wnl Renal: Scr elevated above baseline at 2.29 (BL ~1.4), BUN elevated 46 Hepatic: LFTs elevated - AST 226, ALT 450, Alk phos 469 Intake / Output; MIVF: LR at 125/hr GI Imaging:  - 1/2 CT A/P: distal colonic wall thickening consistent with inflammatory or infectious colitis, no evidence of bowel obstruction or ileus GI Surgeries / Procedures: None thus far this admit  Central access: PICC  TPN start date: Continued from PTA - will resume 11/28/21  RD Assessment:  Pending  Current Nutrition:  NPO and TPN to start 1/3  Most Recent Home TPN (per [facility] Rx Note on 12/22):  - Cycle 1800 mL over 12 hours - Protein 85g, CHO 220g - 20% SMOF 60g 3x week on MWF  Plan:  TPN will be initiated on 1/3 as it is past cut-off time for TPN orders No D10 per discussion with MD Check TPN labs 1/3  Dimple Nanas, PharmD 11/27/2021 6:00 PM

## 2021-11-27 NOTE — Assessment & Plan Note (Deleted)
No obvious infectious etiology. Possibly inflammatory. infectio

## 2021-11-27 NOTE — Assessment & Plan Note (Addendum)
-  Continue Wellbutrin and Cymbalta

## 2021-11-27 NOTE — Assessment & Plan Note (Addendum)
-  Continue Creon

## 2021-11-27 NOTE — Assessment & Plan Note (Addendum)
Chronic. 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

## 2021-11-27 NOTE — Assessment & Plan Note (Addendum)
-  Continue home meds (methadone, dilaudid PO) with prn IV breakthrough

## 2021-11-27 NOTE — Assessment & Plan Note (Deleted)
Baseline creatinine of about 1.5-1.6. Creatinine of 2.29 no admission. Likely secondary to decreased oral intake and diarrhea with suspected Crohn flare. No hydronephrosis on CT imaging. Initially improved with IV fluids with slight bump today likely related to emesis over the weekend -Continue IV fluids while off of TPN -D5 1/2 NS @ 100 mL/hr

## 2021-11-27 NOTE — Assessment & Plan Note (Addendum)
Secondary to colitis. Negative C. Difficile/GI pathogen panel testing. Colonoscopy performed on 1/4 without evidence of active Crohn. Abdominal pain is improved and patient currently has her chronic pain.

## 2021-11-27 NOTE — ED Notes (Signed)
Dr. Jeanell Sparrow aware that only one set of blood cultures has been obtained. Pt difficult stick, will attempt to obtain second set.

## 2021-11-27 NOTE — ED Triage Notes (Signed)
Patient arrived via [facility] with complaints of generalized abdominal pain. States she was recently treated at [facility] for an infection in her PICC line, line still in place and last dose of antibiotics are due tomorrow. Hx of colitis and Chrons.

## 2021-11-27 NOTE — Assessment & Plan Note (Addendum)
Limited IV access. Initially with right femoral vein PICC line which was removed on 1/4 by IR secondary to candidemia. Plan for PICC reinsertion 1/9 but rescheduled to 1/10.

## 2021-11-27 NOTE — Assessment & Plan Note (Addendum)
Colonoscopy not consistent with Crohn flare. GI consulted. GI pathogen panel and C. Difficile ordered but no sample to date. Previously on budesonide but discontinued in 2022.

## 2021-11-27 NOTE — ED Provider Notes (Signed)
[facility] DEPT Provider Note   CSN: [id number] Arrival date & time: 11/27/21  0154     History  Chief Complaint  Patient presents with   Abdominal Pain    PHYLLISS STREGE is a 61 y.o. female.  HPI 61 yo female with right femoral double lumen with recent line infection d/c'd 12/23 on home antibiotics cipro intracatheter lock solution and levaquin, presents with last day of abx, now with abdominal pain began after Christmas was coming and going now constant.  Pain in lower abdomen increased on left, decreased po, nausea and vomiting last emesislast night.  Patient on tpn.  Chronic diarrhea near baseline per patient. Fever 100.7 today, positive chills, no uti sxs  Discharge summary from 1222 reviewed.  She had a recent blood cultures positive for skin stenotrophomona and Pseudomonas which were reviewed from October 17 and states they came from her femoral artery  She states that this line in her right femoral vein is her only and last IV access through which she receives antibiotics, fluids, and TPN She has had recurrent line infections. She has known Crohn's disease with short gut syndrome Additional illnesses include hypertension seizure disorder, CKD stage IV, pancreatic insufficiency Home Medications Prior to Admission medications   Medication Sig Start Date End Date Taking? Authorizing Provider  acetaminophen (TYLENOL) 650 MG CR tablet Take 1,300 mg by mouth every 6 (six) hours.    [provider]  amLODipine (NORVASC) 10 MG tablet Take 1 tablet by mouth once daily Patient taking differently: Take 10 mg by mouth daily. 08/22/21   [name], [name], MD  buPROPion [facility] SR) 100 MG 12 hr tablet Take 2 tablets by mouth once daily Patient taking differently: Take 200 mg by mouth daily. 08/30/21   [name], [name], MD  Calcium 500 MG tablet Take 500 mg by mouth daily.    [provider]  carvedilol (COREG)  12.5 MG tablet Take 1 tablet (12.5 mg total) by mouth 2 (two) times daily with a meal. 09/29/21   Little Ishikawa, MD  cholecalciferol (VITAMIN D3) 25 MCG (1000 UT) tablet Take 1,000 Units by mouth daily.     [provider]  ciprofloxacin (CIPRO) 400 MG/200ML SOLN Inject into the vein. 11/16/21   [provider]  ciprofloxacin 0.2 mg/ml + heparin 5000 units/ml SOLN 2.5 mLs by Intracatheter route daily for 12 days. Dwell in PICC lumen #2 for a minimum of 2 hours daily when PICC not in use End date 11/27/21 11/16/21 11/28/21  Bonnielee Haff, MD  ciprofloxacin 0.2 mg/ml + heparin 5000 units/ml SOLN 2.5 mLs by Intracatheter route daily for 11 days. Dwell in PICC lumen #1 for a minimum of 2 hours daily when PICC not in use. End 11/27/21 11/16/21 11/27/21  Bonnielee Haff, MD  cycloSPORINE (RESTASIS) 0.05 % ophthalmic emulsion Place 1 drop into both eyes 2 (two) times daily.    [provider]  DEXILANT 60 MG capsule Take 1 capsule by mouth once daily 11/21/21   [name], [name], MD  diphenoxylate-atropine (LOMOTIL) 2.5-0.025 MG tablet Take 1 tablet by mouth 2 (two) times daily as needed for diarrhea or loose stools. 09/04/21   [provider]  DULoxetine (CYMBALTA) 60 MG capsule Take 2 capsules (120 mg total) by mouth daily. 09/15/21   [name], [name], MD  estradiol (ESTRACE) 2 MG tablet Take 1 tablet by mouth once daily Patient taking differently: Take 2 mg by mouth daily. 10/03/21  [name], [name], MD  famotidine (PEPCID) 20 MG tablet Take 20 mg by mouth daily as needed for heartburn or indigestion.    [provider]  GATTEX 5 MG KIT Inject 1.5 mg into the skin daily. 01/03/21   [provider]  hydrALAZINE (APRESOLINE) 100 MG tablet Take 1 tablet (100 mg total) by mouth 3 (three) times daily. 11/07/21   [name], [name], MD  HYDROmorphone (DILAUDID) 4 MG tablet Take 4 mg by mouth 4 (four) times daily. 04/18/21    [provider]  levETIRAcetam (KEPPRA) 500 MG tablet Take 1 tablet (500 mg total) by mouth 2 (two) times daily. 08/28/21   Cameron Sprang, MD  levofloxacin (LEVAQUIN) 500 MG/100ML SOLN Inject 100 mLs (500 mg total) into the vein daily for 12 days. Indication: Stenotrophomonas/Psuedomonas Bacteremia First Dose: Yes  Last Day of Therapy: 11/27/21 Labs - Once weekly: CBC/D and BMP Labs - Every other week: ESR and CRP  Method of administration: IV Push Method of administration may be changed at the discretion of home infusion pharmacist based upon assessment of the patient and/or caregiver's ability to self-administer the medication ordered 11/16/21 11/28/21  Bonnielee Haff, MD  lipase/protease/amylase (CREON) 36000 UNITS CPEP capsule Take 1 capsule (36,000 Units total) by mouth See admin instructions. 36000 units with meals Patient taking differently: Take 36,000 Units by mouth 3 (three) times daily with meals. 06/30/21   Eugenie Filler, MD  loperamide (IMODIUM) 2 MG capsule Take 4 mg by mouth 2 (two) times daily as needed for diarrhea or loose stools. 09/26/21   [provider]  methadone (DOLOPHINE) 5 MG tablet Take 1 tablet (5 mg total) by mouth 5 (five) times daily. 09/29/21   Little Ishikawa, MD  methocarbamol (ROBAXIN) 500 MG tablet Take 1 tablet (500 mg total) by mouth in the morning and at bedtime. 06/13/21   [name], [name], MD  Multiple Vitamins-Minerals (MULTIVITAMIN ADULT PO) Take 1 tablet by mouth daily.    [provider]  NARCAN 4 MG/0.1ML LIQD nasal spray kit Place 1 spray into the nose once as needed (overdose). 10/14/20   [provider]  ondansetron (ZOFRAN) 4 MG tablet Take 4 mg by mouth every 8 (eight) hours as needed. 11/18/21   [provider]  polyvinyl alcohol (LIQUIFILM TEARS) 1.4 % ophthalmic solution Place 1 drop into both eyes as needed for dry eyes.    [provider]  PRESCRIPTION MEDICATION Inject 1 each  into the vein daily. Home TPN . [facility] [place] . 1 bag for 12 hours. [phone]    [provider]  promethazine (PHENERGAN) 25 MG tablet TAKE 1 TABLET BY MOUTH EVERY 6 HOURS AS NEEDED FOR NAUSEA FOR VOMITING 11/16/21   [name], [name], MD  sodium chloride 0.9 % infusion Inject 1,000 mLs into the vein daily. For chronic dehydration 10/16/20   [provider]  sucralfate (CARAFATE) 1 GM/10ML suspension Take 10 mLs (1 g total) by mouth 2 (two) times daily. 06/30/21   Eugenie Filler, MD  Trace Minerals Cu-Mn-Se-Zn (TRALEMENT IV) Inject 1 mL into the vein See admin instructions. Used in TPN bag 4 times weekly    [provider]  vitamin B-12 (CYANOCOBALAMIN) 1000 MCG tablet Take 1,000 mcg by mouth daily.     [provider]      Allergies    Meperidine, Hyoscyamine, Cefepime, Gabapentin, Lyrica [pregabalin], Topamax [topiramate], Zosyn [piperacillin sod-tazobactam so], Fentanyl, and  Morphine and related    Review of Systems   Review of Systems  All other systems reviewed and are negative.  Physical Exam Updated Vital Signs BP (!) 175/67 (BP Location: Left Arm)    Pulse 68    Temp 98.6 F (37 C) (Oral)    Resp 18    Ht 1.727 m (5' 8" )    Wt 61.2 kg    SpO2 100%    BMI 20.53 kg/m  Physical Exam Vitals and nursing note reviewed.  Constitutional:      General: She is not in acute distress.    Appearance: She is not ill-appearing.  HENT:     Head: Normocephalic.     Mouth/Throat:     Mouth: Mucous membranes are moist.  Eyes:     Extraocular Movements: Extraocular movements intact.  Cardiovascular:     Rate and Rhythm: Normal rate and regular rhythm.  Pulmonary:     Effort: Pulmonary effort is normal.     Breath sounds: Normal breath sounds.  Abdominal:     General: Abdomen is flat. Bowel sounds are normal.     Palpations: Abdomen is soft.     Tenderness: There is abdominal tenderness.     Comments: diffuse  tenderness to palpation  Skin:    General: Skin is warm and dry.     Capillary Refill: Capillary refill takes less than 2 seconds.  Neurological:     General: No focal deficit present.     Mental Status: She is alert.  Psychiatric:        Mood and Affect: Mood normal.        Behavior: Behavior normal.    ED Results / Procedures / Treatments   Labs (all labs ordered are listed, but only abnormal results are displayed) Labs Reviewed  COMPREHENSIVE METABOLIC PANEL - Abnormal; Notable for the following components:      Result Value   Sodium 134 (*)    BUN 46 (*)    Creatinine, Ser 2.29 (*)    Albumin 2.8 (*)    AST 226 (*)    ALT 450 (*)    Alkaline Phosphatase 469 (*)    GFR, Estimated 24 (*)    All other components within normal limits  CBC - Abnormal; Notable for the following components:   WBC 2.9 (*)    RBC 2.56 (*)    Hemoglobin 8.0 (*)    HCT 24.0 (*)    Platelets 147 (*)    All other components within normal limits  URINALYSIS, ROUTINE W REFLEX MICROSCOPIC - Abnormal; Notable for the following components:   APPearance HAZY (*)    Protein, ur 100 (*)    Leukocytes,Ua TRACE (*)    Bacteria, UA FEW (*)    All other components within normal limits  CULTURE, BLOOD (ROUTINE X 2)  CULTURE, BLOOD (ROUTINE X 2)  RESP PANEL BY RT-PCR (FLU A&B, COVID) ARPGX2  LIPASE, BLOOD    EKG None  Radiology CT ABDOMEN PELVIS WO CONTRAST  Result Date: 11/27/2021 CLINICAL DATA:  Generalized abdominal pain, history of Crohn disease, recent liver biopsy EXAM: CT ABDOMEN AND PELVIS WITHOUT CONTRAST TECHNIQUE: Multidetector CT imaging of the abdomen and pelvis was performed following the standard protocol without IV contrast. Unenhanced CT was performed per clinician order. Lack of IV contrast limits sensitivity and specificity, especially for evaluation of abdominal/pelvic solid viscera. COMPARISON:  11/11/2021, 11/11/2020 FINDINGS: Lower chest: No acute pleural or parenchymal lung  disease. Hepatobiliary: Biliary dilation and  pneumobilia are again identified, with high attenuation material within the intrahepatic and extrahepatic bile ducts likely refluxed oral contrast suggesting prior sphincterotomy or incompetent sphincter. Otherwise unremarkable unenhanced appearance of the liver. Gallbladder is absent. Pancreas: Unremarkable unenhanced appearance. Spleen: Unremarkable unenhanced appearance. Adrenals/Urinary Tract: No urinary tract calculi or obstructive uropathy. The adrenals are difficult to appreciate on this unenhanced exam. Bladder is unremarkable. Stomach/Bowel: Evaluation is limited without IV contrast. Oral contrast has traversed into the colon by the time of imaging. Multiple enterotomy staple lines are again seen within the mid abdomen. There is wall thickening of the distal colon, extending from the splenic flexure through the rectosigmoid junction. Scattered colonic gas fluid levels are noted. Vascular/Lymphatic: Right femoral PICC again identified, tip at the level of the hepatic veins. No other significant vascular finding on this limited unenhanced exam. No discrete adenopathy. Reproductive: Status post hysterectomy. No adnexal masses. Other: No free fluid or free intraperitoneal gas. No abdominal wall hernia. Musculoskeletal: No acute or destructive bony lesions. Reconstructed images demonstrate no additional findings. IMPRESSION: 1. Distal colonic wall thickening consistent with inflammatory or infectious colitis. 2. No evidence of bowel obstruction or ileus. Scattered distal colonic gas fluid levels may reflect diarrhea or recent laxative/enema use. 3. Stable biliary duct dilation, with increased pneumobilia and reflux of oral contrast into the biliary tree. This likely reflects previous sphincterotomy. 4. Right femoral PICC, tip at level of the hepatic veins. Electronically Signed   By: Randa Ngo M.D.   On: 11/27/2021 15:58   DG Chest Port 1 View  Result Date:  11/27/2021 CLINICAL DATA:  Sixty fever EXAM: PORTABLE CHEST 1 VIEW COMPARISON:  October 07, 2021 FINDINGS: The heart size and mediastinal contours are stable. Both lungs are clear. The visualized skeletal structures are unremarkable. IMPRESSION: No active disease. Electronically Signed   By: Abelardo Diesel M.D.   On: 11/27/2021 14:14    Procedures Procedures  Medications Ordered in ED Medications  HYDROmorphone (DILAUDID) injection 1 mg (has no administration in time range)  levofloxacin (LEVAQUIN) IVPB 500 mg (has no administration in time range)  ondansetron (ZOFRAN-ODT) disintegrating tablet 4 mg (4 mg Oral Given 11/27/21 0211)  lactated ringers bolus 500 mL (500 mLs Intravenous New Bag/Given 11/27/21 1400)  iohexol (OMNIPAQUE) 9 MG/ML oral solution 1,000 mL (1,000 mLs Oral Contrast Given 11/27/21 1442)    ED Course/ Medical Decision Making/ A&P                            This patient presents to the ED for concern of line sepsis intraabdominal pain, this involves an extensive number of treatment options, and is a complaint that carries with it a high risk of complications and morbidity.  The differential diagnosis includes sepsis, crohn's flare, other intra-abdominal sources of infection including urinary tract infection, abscess, other etiologies throughout the abdomen, line infection as well as the above-mentioned sources   Co morbidities that complicate the patient evaluation  Patient has right femoral catheter with no other access available not able to have his catheter removed.   Additional history obtained:  Additional history obtained from last discharge summary External records from outside source obtained and reviewed including    Lab Tests:  I Ordered, and personally interpreted labs.  The pertinent results include: CBC ordered and reviewed with leukopenia and anemia both present before but more pronounced now Worsening renal failure with creatinine increased to 2.29 from  first prior of 1.51 Mild hyponatremia otherwise  electrolytes appear within normal limits Evaded alk phos and transaminitis with AST at 226 and ALT at 458 Bilirubin normal   Imaging Studies ordered:  I ordered imaging studies including chest x-Jodelle Fausto and CT ordered and pending I independently visualized and interpreted imaging which showed ct abd with ct c.w. colitis and cxr clear I agree with the radiologist interpretation   Cardiac Monitoring:  The patient was maintained on a cardiac monitor.  I personally viewed and interpreted the cardiac monitored which showed an underlying rhythm of: Normal sinus rhythm   Medicines ordered and prescription drug management:  I ordered medication including levaquin given recent susceptibilities  for discussed with Dimple Nanas, Pharm D  Reevaluation of the patient after these medicines showed that the patient stayed the same I have reviewed the patients home medicines and have made adjustments as needed   Test Considered:  Considered fluids, considered pulling line-unable to pull line as patient has no other access and has reported no vascular access left   Critical Interventions:  Patient given IV fluids and blood cultures obtained Levaquin given IV   Consultations Obtained:  I requested consultation with the hospitalist,  and discussed lab and imaging findings as well as pertinent plan - they recommend: admission   Problem List / ED Course:  Line infection, Crohn's and abdominal pain   Reevaluation:  After the interventions noted above, I reevaluated the patient and found that they have :worsened   Social Determinants of Health:     Dispostion:  After consideration of the diagnostic results and the patients response to treatment, I feel that the patent would benefit from IV fluids, ongoing levaquin as discussed with pharmacy to cover line infection and crohn's exacerbation.  1- crohn's with flare 2-line  infection 3-aki 4-anemia- chronic and appears stable  Medical Decision Making Final Clinical Impression(s) / ED Diagnoses Final diagnoses:  Generalized abdominal pain  Crohn's disease of colon with other complication ([place])  Central venous line infection, subsequent encounter    Rx / DC Orders ED Discharge Orders     None         Pattricia Boss, MD 11/27/21 1846

## 2021-11-28 ENCOUNTER — Inpatient Hospital Stay: Payer: Medicare Other | Admitting: Internal Medicine

## 2021-11-28 DIAGNOSIS — K529 Noninfective gastroenteritis and colitis, unspecified: Secondary | ICD-10-CM | POA: Diagnosis not present

## 2021-11-28 DIAGNOSIS — T827XXA Infection and inflammatory reaction due to other cardiac and vascular devices, implants and grafts, initial encounter: Secondary | ICD-10-CM | POA: Diagnosis not present

## 2021-11-28 DIAGNOSIS — R109 Unspecified abdominal pain: Secondary | ICD-10-CM | POA: Diagnosis not present

## 2021-11-28 DIAGNOSIS — K50118 Crohn's disease of large intestine with other complication: Secondary | ICD-10-CM | POA: Diagnosis not present

## 2021-11-28 DIAGNOSIS — R7881 Bacteremia: Secondary | ICD-10-CM

## 2021-11-28 DIAGNOSIS — R7989 Other specified abnormal findings of blood chemistry: Secondary | ICD-10-CM | POA: Diagnosis not present

## 2021-11-28 LAB — GASTROINTESTINAL PANEL BY PCR, STOOL (REPLACES STOOL CULTURE)

## 2021-11-28 LAB — COMPREHENSIVE METABOLIC PANEL
ALT: 307 U/L — ABNORMAL HIGH (ref 0–44)
AST: 110 U/L — ABNORMAL HIGH (ref 15–41)
Albumin: 2.5 g/dL — ABNORMAL LOW (ref 3.5–5.0)
Alkaline Phosphatase: 386 U/L — ABNORMAL HIGH (ref 38–126)
Anion gap: 10 (ref 5–15)
BUN: 37 mg/dL — ABNORMAL HIGH (ref 6–20)
CO2: 24 mmol/L (ref 22–32)
Calcium: 9.2 mg/dL (ref 8.9–10.3)
Chloride: 102 mmol/L (ref 98–111)
Creatinine, Ser: 1.91 mg/dL — ABNORMAL HIGH (ref 0.44–1.00)
GFR, Estimated: 30 mL/min — ABNORMAL LOW (ref >60)
Glucose, Bld: 70 mg/dL (ref 70–99)
Potassium: 4.4 mmol/L (ref 3.5–5.1)
Sodium: 136 mmol/L (ref 135–145)
Total Bilirubin: 1 mg/dL (ref 0.3–1.2)
Total Protein: 5.9 g/dL — ABNORMAL LOW (ref 6.5–8.1)

## 2021-11-28 LAB — BLOOD CULTURE ID PANEL (REFLEXED) - BCID2

## 2021-11-28 LAB — CBC
HCT: 22.7 % — ABNORMAL LOW (ref 36.0–46.0)
Hemoglobin: 7.5 g/dL — ABNORMAL LOW (ref 12.0–15.0)
MCH: 31 pg (ref 26.0–34.0)
MCHC: 33 g/dL (ref 30.0–36.0)
MCV: 93.8 fL (ref 80.0–100.0)
Platelets: 138 10*3/uL — ABNORMAL LOW (ref 150–400)
RBC: 2.42 MIL/uL — ABNORMAL LOW (ref 3.87–5.11)
RDW: 13.4 % (ref 11.5–15.5)
WBC: 2.3 10*3/uL — ABNORMAL LOW (ref 4.0–10.5)
nRBC: 0 % (ref 0.0–0.2)

## 2021-11-28 LAB — TRIGLYCERIDES: Triglycerides: 126 mg/dL (ref <150)

## 2021-11-28 LAB — C DIFFICILE QUICK SCREEN W PCR REFLEX
C Diff antigen: NEGATIVE
C Diff interpretation: NOT DETECTED
C Diff toxin: NEGATIVE

## 2021-11-28 LAB — MAGNESIUM: Magnesium: 2.1 mg/dL (ref 1.7–2.4)

## 2021-11-28 LAB — PHOSPHORUS: Phosphorus: 3.9 mg/dL (ref 2.5–4.6)

## 2021-11-28 MED ORDER — CHLORHEXIDINE GLUCONATE CLOTH 2 % EX PADS
6.0000 | MEDICATED_PAD | Freq: Every day | CUTANEOUS | Status: DC
  Administered 2021-11-28 – 2021-12-06 (×8): 6 via TOPICAL

## 2021-11-28 MED ORDER — PEG-KCL-NACL-NASULF-NA ASC-C 100 G PO SOLR
0.5000 | Freq: Once | ORAL | Status: AC
  Administered 2021-11-28: 100 g via ORAL
  Filled 2021-11-28: qty 1

## 2021-11-28 MED ORDER — BOOST / RESOURCE BREEZE PO LIQD CUSTOM
1.0000 | Freq: Three times a day (TID) | ORAL | Status: DC
  Administered 2021-11-28 – 2021-12-06 (×15): 1 via ORAL

## 2021-11-28 MED ORDER — SODIUM CHLORIDE 0.9 % IV SOLN
100.0000 mg | INTRAVENOUS | Status: DC
  Administered 2021-11-29: 100 mg via INTRAVENOUS
  Filled 2021-11-28: qty 100

## 2021-11-28 MED ORDER — SODIUM CHLORIDE 0.9 % IV SOLN: INTRAVENOUS | Status: DC

## 2021-11-28 MED ORDER — SODIUM CHLORIDE 0.9 % IV SOLN
200.0000 mg | Freq: Once | INTRAVENOUS | Status: AC
  Administered 2021-11-28: 200 mg via INTRAVENOUS
  Filled 2021-11-28: qty 200

## 2021-11-28 MED ORDER — PEG-KCL-NACL-NASULF-NA ASC-C 100 G PO SOLR: 1.0000 | Freq: Once | ORAL | Status: DC

## 2021-11-28 MED ORDER — SODIUM CHLORIDE 0.9% FLUSH
10.0000 mL | Freq: Two times a day (BID) | INTRAVENOUS | Status: DC
  Administered 2021-11-29 – 2021-12-05 (×4): 10 mL

## 2021-11-28 MED ORDER — HEPARIN SODIUM (PORCINE) 5000 UNIT/ML IJ SOLN
5000.0000 [IU] | Freq: Three times a day (TID) | INTRAMUSCULAR | Status: DC
  Administered 2021-11-29 – 2021-12-06 (×16): 5000 [IU] via SUBCUTANEOUS
  Filled 2021-11-28 (×19): qty 1

## 2021-11-28 MED ORDER — HEPARIN SODIUM (PORCINE) 5000 UNIT/ML IJ SOLN
5000.0000 [IU] | Freq: Three times a day (TID) | INTRAMUSCULAR | Status: AC
  Administered 2021-11-28: 5000 [IU] via SUBCUTANEOUS
  Filled 2021-11-28: qty 1

## 2021-11-28 MED ORDER — LIDOCAINE HCL 1 % IJ SOLN
INTRAMUSCULAR | Status: AC
  Filled 2021-11-28: qty 20

## 2021-11-28 NOTE — Plan of Care (Signed)
°  Problem: Activity: Goal: Risk for activity intolerance will decrease Outcome: Progressing   Problem: Elimination: Goal: Will not experience complications related to urinary retention Outcome: Progressing   Problem: Pain Managment: Goal: General experience of comfort will improve Outcome: Progressing   Problem: Safety: Goal: Ability to remain free from injury will improve Outcome: Progressing

## 2021-11-28 NOTE — Progress Notes (Signed)
PHARMACY - PHYSICIAN COMMUNICATION CRITICAL VALUE ALERT - BLOOD CULTURE IDENTIFICATION (BCID)  Caitlin Peters is an 61 y.o. female who presented to [facility] on 11/27/2021 with a chief complaint of abdominal pain  Assessment:  Patient just completed antibiotic course on 11/27/20. Pt on chronic TPN with frequent admissions.   BCID + 1/3 yeast, no organism identified on panel  Name of physician (or Provider) Contacted: Dr. Rodena Piety. ID auto-consult generated for fungemia.   Changes to prescribed antibiotics recommended: None - Eraxis has already been ordered.   Results for orders placed or performed during the hospital encounter of 11/27/21  Blood Culture ID Panel (Reflexed) (Collected: 11/27/2021 12:20 PM)  Result Value Ref Range   Enterococcus faecalis NOT DETECTED NOT DETECTED   Enterococcus Faecium NOT DETECTED NOT DETECTED   Listeria monocytogenes NOT DETECTED NOT DETECTED   Staphylococcus species NOT DETECTED NOT DETECTED   Staphylococcus aureus (BCID) NOT DETECTED NOT DETECTED   Staphylococcus epidermidis NOT DETECTED NOT DETECTED   Staphylococcus lugdunensis NOT DETECTED NOT DETECTED   Streptococcus species NOT DETECTED NOT DETECTED   Streptococcus agalactiae NOT DETECTED NOT DETECTED   Streptococcus pneumoniae NOT DETECTED NOT DETECTED   Streptococcus pyogenes NOT DETECTED NOT DETECTED   A.calcoaceticus-baumannii NOT DETECTED NOT DETECTED   Bacteroides fragilis NOT DETECTED NOT DETECTED   Enterobacterales NOT DETECTED NOT DETECTED   Enterobacter cloacae complex NOT DETECTED NOT DETECTED   Escherichia coli NOT DETECTED NOT DETECTED   Klebsiella aerogenes NOT DETECTED NOT DETECTED   Klebsiella oxytoca NOT DETECTED NOT DETECTED   Klebsiella pneumoniae NOT DETECTED NOT DETECTED   Proteus species NOT DETECTED NOT DETECTED   Salmonella species NOT DETECTED NOT DETECTED   Serratia marcescens NOT DETECTED NOT DETECTED   Haemophilus influenzae NOT DETECTED NOT DETECTED   Neisseria  meningitidis NOT DETECTED NOT DETECTED   Pseudomonas aeruginosa NOT DETECTED NOT DETECTED   Stenotrophomonas maltophilia NOT DETECTED NOT DETECTED   Candida albicans NOT DETECTED NOT DETECTED   Candida auris NOT DETECTED NOT DETECTED   Candida glabrata NOT DETECTED NOT DETECTED   Candida krusei NOT DETECTED NOT DETECTED   Candida parapsilosis NOT DETECTED NOT DETECTED   Candida tropicalis NOT DETECTED NOT DETECTED   Cryptococcus neoformans/gattii NOT DETECTED NOT DETECTED    Lenis Noon, PharmD 11/28/2021  10:35 AM

## 2021-11-28 NOTE — Consult Note (Signed)
Consultation  Referring Provider: Dr. Zigmund Daniel    Primary Care Physician:  [name], [name], MD Primary Gastroenterologist: [facility] ([place] saw in the last 30 days at the hospital)        Reason for Consultation:   Abdominal pain and fever         HPI:   Caitlin Peters is a 61 y.o. 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    Of note see recent hospital admission with fever and abdominal pain found to have multiple forms of bacteria, discharged on Levaquin and Cipro intracatheter lock with plans to continue until 11/27/2021.    Today, patient explains that since she left the hospital a couple of weeks ago immediately upon returning home she felt like her abdomen was hard and and she could hear her intestines working, "which is usual for my flares of colitis".  Then she started with increasing abdominal pain, nausea and vomiting since Friday evening which continued and worsened and prompted her return to the ER.  Tells me she has continued with loose stools which is "normal for me with my short gut", this seems to be controlled well with her medicine and does not necessarily seem to be increased at all, no hematochezia.  She does tell me that she continues with abdominal pain but some of this is chronic ever since she had "my bowel cut out".  Tells me this is always worse on the left side and specifically after eating.  Patient was worried that she may have a catheter infection again due to symptoms.  Tells me since admission she has had no further vomiting and in fact feels slightly  better.  Asking if she can try to eat something today.  Associated symptoms include a fever of 100.7 with some chills and a headache as well as a decreased appetite.     Patient does tell me it is been a long time since that she has been on steroids for colitis because "it just stopped working"    Denies hematochezia, further nausea or vomiting or symptoms that awaken her from sleep.  ED course: AKI and CTAP with distal colonic wall thickening consistent with inflammatory or infectious colitis, no evidence of bowel obstruction or ileus, scattered distal colonic gas/fluid levels which may reflect diarrhea or recent laxative/enema use, stable biliary duct dilation with increased pneumobilia and reflux of oral contrast into the biliary tree which likely reflects previous sphincterotomy and right femoral PICC, tip at the level of hepatic veins  Hospital course: C. difficile negative, GI path panel in process, CMP with continually elevated LFTs  GI history: 11/14/2021 liver biopsies showed minimal to mild nonspecific portal inflammation and 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, findings were noted is nonspecific with a differential diagnosis to include mild drug-induced injury, probably pharmacotherapy, chronic hepatitis B, type 2 diabetes, graft-versus-host disease and also reports show slight conclusions in patients on chronic parenteral nutrition for short-bowel syndrome; at that time patient told to follow-up with her primary GI at [facility] 11/13/2021 our service signed off at that time patient was continue to feel better and liver biopsy was planned, elevated  LFTs thought related to sepsis versus Dili (hydralazine use) 11/11/2021-11/14/2021 hospital admission: We were consulted at that time due to presentation with chills, body aches and abdominal pain and a fever, abdominal pain was diffuse with some nausea, also elevated LFTs; CTAP without contrast 12/17 showed mild intrahepatic,  extrahepatic biliary prominence stable, surgical changes of right hemicolectomy, chronic dilation of the small bowel fluid levels in the lower abdomen possibly increased rectal wall thickening, body wall anasarca, cardiomegaly and new finding of 5 mm nodule in the left lower lobe with cystitis versus bladder to nondistention; ultrasound showed increased echogenicity of the right kidney suggesting medical renal disease 10/07/2021-10/16/21 hospital admission: For worsening diarrhea, abdominal pain, fever and anorexia, CT showed diffuse colitis, ileus versus enteritis and dilated loops of small bowel, developing P SBO not excluded, new pneumobilia, diagnosis Klebsiella and Acinetobacter infection treated with IV Levaquin/Cipro and line left in place, LFTs elevated but resolving by discharge; a CTAP that admission showed distended loops of small bowel similar to previous, changes of right hemicolectomy and large amount of stool in the residual colon, rectum, anasarca, mild intra and extrahepatic biliary ductal dilation diffuse dilation of the pancreatic duct unchanged from previous, PICC line placed on 12/9 10/27/2020 colonoscopy: No active Crohn's, CRP normal 10/25/2020 CTAP without contrast at [facility]-showed air-filled branching tubules in the left hepatic lobe favoring pneumobilia though portal venous gas difficult to exclude, CBD distended and filled with air, small amount of enteric contrast in the distal bile duct suggesting incompetent sphincter of Oddi, extensive postsurgical changes of the small and large bowel with scattered luminal narrowing/mural thickening in the colon 02/2020 EUS/ERCP-4 CBD, PD dilation at MRCP and elevated LFTs, J-shaped formula stomach with gastric erythema, angulation deformity of the duodenum, ERCP revealed prior sphincterotomy, sphincteroplasty performed, entire main bile duct dilated, biliary tree swept with nothing found, EUS showed mild dilation of the CBD but no stones or sludge,  pancreatic duct dilated in the region of the head body and tail, no significant pancreatic parenchymal disease, nonmalignant looking lymph nodes at the celiac, peripancreatic and porta hepatis region 10/2019 ERCP-for suspected ascending cholangitis, prior cholecystectomy, Dr. Lyndel Safe found benign-appearing biliary papillary stenosis and choledocholithiasis, underwent sphincterotomy and sphincteroplasty as well as balloon sweep of bile duct with no evidence of PSC    Past Medical History:  Diagnosis Date   Acute pancreatitis 04/13/2020   Anasarca 10/2019   AVN (avascular necrosis of bone) ([place])    Cataract    Choledocholithiasis (sludge) s/p ERCP 10/2019 10/21/2020   Chronic pain syndrome    CKD (chronic kidney disease), stage III ([name])    Depression    Diverticulosis    GERD (gastroesophageal reflux disease)    HTN (hypertension)    IDA (iron deficiency anemia)    Malnutrition ([place])    Mass in chest    Osteoporosis 12/24/2014   Pancreatitis    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; 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; jejunal to transverse colon anastomosis Treated at [place] SB lengthening to 165cm Dr Alene Mires, [place] [facility]   Vitamin B12 deficiency     Past Surgical History:  Procedure Laterality Date   ABDOMINAL ADHESION SURGERY  01/22/2018   APPENDECTOMY  1989   BILIARY DILATION  11/26/2019   Procedure: BILIARY DILATION;  Surgeon: Jackquline Denmark, MD;  Location: [facility];  Service: Endoscopy;;   BILIARY DILATION  03/08/2020   Procedure:  BILIARY DILATION;  Surgeon: Rush Landmark Telford Nab., MD;  Location: [place];  Service: Gastroenterology;;   BIOPSY  03/08/2020   Procedure: BIOPSY;  Surgeon: Irving Copas., MD;  Location: [place];  Service: Gastroenterology;;   CHEST WALL RESECTION     right thoracotomy,resection of chest mass with anterior rib and  reconstruction using prosthetic mesh and video arthroscopy   CHOLECYSTECTOMY  01/22/2018   COLONOSCOPY  2019   ENTEROSTOMY CLOSURE  04/1999   ERCP N/A 11/26/2019   Procedure: ENDOSCOPIC RETROGRADE CHOLANGIOPANCREATOGRAPHY (ERCP);  Surgeon: Jackquline Denmark, MD;  Location: [facility];  Service: Endoscopy;  Laterality: N/A;   ERCP N/A 03/08/2020   Procedure: ENDOSCOPIC RETROGRADE CHOLANGIOPANCREATOGRAPHY (ERCP);  Surgeon: Irving Copas., MD;  Location: [place];  Service: Gastroenterology;  Laterality: N/A;   ESOPHAGOGASTRODUODENOSCOPY N/A 03/08/2020   Procedure: ESOPHAGOGASTRODUODENOSCOPY (EGD);  Surgeon: Irving Copas., MD;  Location: [place];  Service: Gastroenterology;  Laterality: N/A;   EUS N/A 03/08/2020   Procedure: UPPER ENDOSCOPIC ULTRASOUND (EUS) LINEAR;  Surgeon: Irving Copas., MD;  Location: [place];  Service: Gastroenterology;  Laterality: N/A;   ILEOCECETOMY  03/1999   ileocolon resection with abdominal stoma   ILEOSTOMY CLOSURE  2001   IR FLUORO GUIDE CV LINE LEFT  01/07/2020   IR FLUORO GUIDE CV LINE LEFT  03/09/2020   IR FLUORO GUIDE CV LINE LEFT  05/09/2020   IR FLUORO GUIDE CV LINE LEFT  07/20/2020   IR FLUORO GUIDE CV LINE RIGHT  08/05/2020   IR FLUORO GUIDE CV LINE RIGHT  04/03/2021   IR PTA VENOUS EXCEPT DIALYSIS CIRCUIT  01/07/2020   IR REMOVAL TUN CV CATH W/O FL  08/05/2020   IR REMOVAL TUN CV CATH W/O FL  07/11/2021   IR US GUIDE VASC ACCESS LEFT     x 2 06/17/19 and 09/14/2019   IR US GUIDE VASC ACCESS RIGHT  08/05/2020   KNEE SURGERY     right knee    LAPAROSCOPIC SMALL BOWEL RESECTION  2009   2000-2009.  SB resections for Crohns Disease - now with Short gut   OMENTECTOMY  01/22/2018   PARTIAL HYSTERECTOMY  1984   with LSO   REMOVAL OF STONES  11/26/2019   Procedure: REMOVAL OF STONES;  Surgeon: Jackquline Denmark, MD;  Location: [facility];  Service: Endoscopy;;   REMOVAL OF STONES  03/08/2020   Procedure: REMOVAL OF STONES;   Surgeon: Irving Copas., MD;  Location: [facility];  Service: Gastroenterology;;   SALPINGOOPHORECTOMY Left 1984   SALPINGOOPHORECTOMY Right 1990   SERIAL TRANSVERSE ENTEROPLASTY (STEP) - SMALL BOWEL LENGTHENING  01/22/2018   Dr Alene Mires, [facility] - SB length from 120 to 165cm    SMALL INTESTINE SURGERY  2002   SMALL INTESTINE SURGERY  2003   SPHINCTEROTOMY  11/26/2019   Procedure: SPHINCTEROTOMY;  Surgeon: Jackquline Denmark, MD;  Location: [facility];  Service: Endoscopy;;   TOTAL ABDOMINAL HYSTERECTOMY  1990   with RSO   UPPER GASTROINTESTINAL ENDOSCOPY      Family History  Problem Relation Age of Onset   Seizures Mother    Glaucoma Mother    CAD Father    Heart disease Father    Hypertension Father    Breast cancer Sister    Multiple sclerosis Sister    Diabetes Sister    Lupus Sister    Colon cancer Other    Crohn's disease Other     Social History   Tobacco Use   Smoking status:  Former    Packs/day: 0.50    Years: 35.00    Pack years: 17.50    Types: Cigarettes   Smokeless tobacco: Never  Vaping Use   Vaping Use: Never used  Substance Use Topics   Alcohol use: Not Currently   Drug use: Never    Prior to Admission medications   Medication Sig Start Date End Date Taking? Authorizing Provider  acetaminophen (TYLENOL) 650 MG CR tablet Take 1,300 mg by mouth every 6 (six) hours.   Yes [provider]  amLODipine (NORVASC) 10 MG tablet Take 1 tablet by mouth once daily Patient taking differently: Take 10 mg by mouth daily. 08/22/21  Yes [name], [name], MD  buPROPion ER [facility] SR) 100 MG 12 hr tablet Take 2 tablets by mouth once daily Patient taking differently: Take 200 mg by mouth daily. 08/30/21  Yes [name], [name], MD  Calcium 500 MG tablet Take 500 mg by mouth daily.   Yes [provider]  carvedilol (COREG) 12.5 MG tablet Take 1 tablet (12.5 mg total) by mouth 2 (two) times daily with a meal.  09/29/21  Yes Little Ishikawa, MD  cholecalciferol (VITAMIN D3) 25 MCG (1000 UT) tablet Take 1,000 Units by mouth daily.    Yes [provider]  ciprofloxacin (CIPRO) 400 MG/200ML SOLN Inject 400 mg into the vein daily. 11/16/21  Yes [provider]  cycloSPORINE (RESTASIS) 0.05 % ophthalmic emulsion Place 1 drop into both eyes 2 (two) times daily.   Yes [provider]  DEXILANT 60 MG capsule Take 1 capsule by mouth once daily 11/21/21  Yes [name], [name], MD  diphenoxylate-atropine (LOMOTIL) 2.5-0.025 MG tablet Take 1 tablet by mouth 2 (two) times daily as needed for diarrhea or loose stools. 09/04/21  Yes [provider]  DULoxetine (CYMBALTA) 60 MG capsule Take 2 capsules (120 mg total) by mouth daily. 09/15/21  Yes [name], [name], MD  estradiol (ESTRACE) 2 MG tablet Take 1 tablet by mouth once daily Patient taking differently: Take 2 mg by mouth daily. 10/03/21  Yes [name], [name], MD  famotidine (PEPCID) 20 MG tablet Take 20 mg by mouth daily as needed for heartburn or indigestion.   Yes [provider]  GATTEX 5 MG KIT Inject 1.5 mg into the skin daily. 01/03/21  Yes [provider]  hydrALAZINE (APRESOLINE) 100 MG tablet Take 1 tablet (100 mg total) by mouth 3 (three) times daily. 11/07/21  Yes [name], [name], MD  HYDROmorphone (DILAUDID) 4 MG tablet Take 4 mg by mouth 4 (four) times daily. 04/18/21  Yes [provider]  levETIRAcetam (KEPPRA) 500 MG tablet Take 1 tablet (500 mg total) by mouth 2 (two) times daily. 08/28/21  Yes Cameron Sprang, MD  levofloxacin (LEVAQUIN) 500 MG/100ML SOLN Inject 100 mLs (500 mg total) into the vein daily for 12 days. Indication: Stenotrophomonas/Psuedomonas Bacteremia First Dose: Yes  Last Day of Therapy: 11/27/21 Labs - Once weekly: CBC/D and BMP Labs - Every other week: ESR and CRP  Method of administration: IV Push Method of administration may  be changed at the discretion of home infusion pharmacist based upon assessment of the patient and/or caregiver's ability to self-administer the medication ordered 11/16/21 11/28/21 Yes Bonnielee Haff, MD  lipase/protease/amylase (CREON) 36000 UNITS CPEP capsule Take 1 capsule (36,000 Units total) by mouth See admin instructions. 36000 units with meals Patient taking differently: Take 36,000 Units by mouth 3 (three) times daily  with meals. 06/30/21  Yes Eugenie Filler, MD  loperamide (IMODIUM) 2 MG capsule Take 4 mg by mouth 2 (two) times daily as needed for diarrhea or loose stools. 09/26/21  Yes [provider]  methadone (DOLOPHINE) 5 MG tablet Take 1 tablet (5 mg total) by mouth 5 (five) times daily. 09/29/21  Yes Little Ishikawa, MD  methocarbamol (ROBAXIN) 500 MG tablet Take 1 tablet (500 mg total) by mouth in the morning and at bedtime. 06/13/21  Yes [name], [name], MD  Multiple Vitamins-Minerals (MULTIVITAMIN ADULT PO) Take 1 tablet by mouth daily.   Yes [provider]  NARCAN 4 MG/0.1ML LIQD nasal spray kit Place 1 spray into the nose once as needed (overdose). 10/14/20  Yes [provider]  ondansetron (ZOFRAN) 4 MG tablet Take 4 mg by mouth every 8 (eight) hours as needed for nausea or vomiting. 11/18/21  Yes [provider]  polyvinyl alcohol (LIQUIFILM TEARS) 1.4 % ophthalmic solution Place 1 drop into both eyes daily as needed for dry eyes.   Yes [provider]  PRESCRIPTION MEDICATION Inject 1 each into the vein daily. Home TPN . [facility] . 1 bag for 12 hours. [phone]   Yes [provider]  promethazine (PHENERGAN) 25 MG tablet TAKE 1 TABLET BY MOUTH EVERY 6 HOURS AS NEEDED FOR NAUSEA FOR VOMITING Patient taking differently: Take 25 mg by mouth every 8 (eight) hours as needed for vomiting or nausea. 11/16/21  Yes [name], [name], MD  sodium chloride 0.9 % infusion Inject  1,000 mLs into the vein daily. For chronic dehydration 10/16/20  Yes [provider]  sucralfate (CARAFATE) 1 GM/10ML suspension Take 10 mLs (1 g total) by mouth 2 (two) times daily. 06/30/21  Yes Eugenie Filler, MD  Trace Minerals Cu-Mn-Se-Zn (TRALEMENT IV) Inject 1 mL into the vein See admin instructions. Used in TPN bag 4 times weekly   Yes [provider]  vitamin B-12 (CYANOCOBALAMIN) 1000 MCG tablet Take 1,000 mcg by mouth daily.    Yes [provider]  ciprofloxacin 0.2 mg/ml + heparin 5000 units/ml SOLN 2.5 mLs by Intracatheter route daily for 12 days. Dwell in PICC lumen #2 for a minimum of 2 hours daily when PICC not in use End date 11/27/21 Patient not taking: Reported on 11/27/2021 11/16/21 11/28/21  Bonnielee Haff, MD    Current Facility-Administered Medications  Medication Dose Route Frequency Provider Last Rate Last Admin   amLODipine (NORVASC) tablet 10 mg  10 mg Oral Daily Elodia Florence., MD   10 mg at 11/27/21 2212   buPROPion ER [facility] SR) 12 hr tablet 200 mg  200 mg Oral Daily Elodia Florence., MD   200 mg at 11/27/21 2215   calcium carbonate (OS-CAL - dosed in mg of elemental calcium) tablet 1,250 mg  1,250 mg Oral Q breakfast Elodia Florence., MD       carvedilol (COREG) tablet 12.5 mg  12.5 mg Oral BID WC Elodia Florence., MD       Chlorhexidine Gluconate Cloth 2 % PADS 6 each  6 each Topical Daily Elodia Florence., MD       cholecalciferol (VITAMIN D3) tablet 1,000 Units  1,000 Units Oral Daily Elodia Florence., MD       cycloSPORINE (RESTASIS) 0.05 % ophthalmic emulsion 1 drop  1 drop Both Eyes BID Elodia Florence., MD  1 drop at 11/27/21 2216   DULoxetine (CYMBALTA) DR capsule 120 mg  120 mg Oral Daily Elodia Florence., MD   120 mg at 11/27/21 2214   estradiol (ESTRACE) tablet 2 mg  2 mg Oral Daily Elodia Florence., MD   2 mg at 11/27/21 2215   heparin injection 5,000 Units  5,000 Units  Subcutaneous Q8H Elodia Florence., MD   5,000 Units at 11/28/21 0543   hydrALAZINE (APRESOLINE) tablet 100 mg  100 mg Oral TID Elodia Florence., MD   100 mg at 11/27/21 2213   HYDROmorphone (DILAUDID) injection 0.5-1 mg  0.5-1 mg Intravenous Q2H PRN Elodia Florence., MD   1 mg at 11/28/21 0700   HYDROmorphone (DILAUDID) injection 1 mg  1 mg Intravenous Q2H PRN Elodia Florence., MD   1 mg at 11/27/21 2231   HYDROmorphone (DILAUDID) tablet 4 mg  4 mg Oral QID Elodia Florence., MD   4 mg at 11/27/21 2213   lactated ringers infusion   Intravenous Continuous Elodia Florence., MD 125 mL/hr at 11/28/21 0542 New Bag at 11/28/21 0542   levETIRAcetam (KEPPRA) tablet 500 mg  500 mg Oral BID Elodia Florence., MD   500 mg at 11/27/21 2214   lipase/protease/amylase (CREON) capsule 36,000 Units  36,000 Units Oral TID with meals Elodia Florence., MD       methadone (DOLOPHINE) tablet 5 mg  5 mg Oral 5 X Daily Elodia Florence., MD   5 mg at 11/28/21 0546   methocarbamol (ROBAXIN) tablet 500 mg  500 mg Oral BID Elodia Florence., MD   500 mg at 11/27/21 2213   multivitamin with minerals tablet   Oral Daily Elodia Florence., MD       ondansetron [facility]) tablet 4 mg  4 mg Oral Q6H PRN Elodia Florence., MD       Or   ondansetron [facility]) injection 4 mg  4 mg Intravenous Q6H PRN Elodia Florence., MD   4 mg at 11/27/21 1814   pantoprazole (PROTONIX) EC tablet 40 mg  40 mg Oral Daily Elodia Florence., MD   40 mg at 11/27/21 2213   pantoprazole (PROTONIX) injection 40 mg  40 mg Intravenous Q24H Elodia Florence., MD   40 mg at 11/27/21 1751   polyvinyl alcohol (LIQUIFILM TEARS) 1.4 % ophthalmic solution 1 drop  1 drop Both Eyes Daily PRN Elodia Florence., MD       sodium chloride flush (NS) 0.9 % injection 10-40 mL  10-40 mL Intracatheter Q12H Elodia Florence., MD       sucralfate (CARAFATE) 1 GM/10ML suspension 1 g  1 g  Oral BID AC Elodia Florence., MD       Teduglutide (rDNA) KIT 1.5 mg  1.5 mg Subcutaneous Daily Elodia Florence., MD       vitamin B-12 (CYANOCOBALAMIN) tablet 1,000 mcg  1,000 mcg Oral Daily Elodia Florence., MD        Allergies as of 11/27/2021 - Review Complete 11/27/2021  Allergen Reaction Noted   Meperidine Hives 08/14/2011   Hyoscyamine Hives and Swelling 07/15/2014   Cefepime Other (See Comments) 11/27/2017   Gabapentin Other (See Comments) 10/13/2019   Lyrica [pregabalin] Other (See Comments) 10/13/2019   Topamax [topiramate] Other (See Comments) 10/13/2019   Zosyn [piperacillin sod-tazobactam so] Other (  See Comments) 08/03/2020   Fentanyl Rash 10/12/2019   Morphine and related Rash 10/12/2019     Review of Systems:    Constitutional: No weight loss Skin: No rash Cardiovascular: No chest pain Respiratory: No SOB  Gastrointestinal: See HPI and otherwise negative Genitourinary: No dysuria Neurological: No headache, dizziness or syncope Musculoskeletal: No new muscle or joint pain Hematologic: No bleeding Psychiatric: No history of depression or anxiety    Physical Exam:  Vital signs in last 24 hours: Temp:  [97.6 F (36.4 C)-98.8 F (37.1 C)] 97.7 F (36.5 C) (01/03 0542) Pulse Rate:  [59-74] 70 (01/03 0542) Resp:  [15-18] 18 (01/03 0542) BP: (130-197)/(60-89) 141/60 (01/03 0542) SpO2:  [97 %-100 %] 97 % (01/03 0542) Weight:  [61.2 kg] 61.2 kg (01/02 1225) Last BM Date: 11/27/21 General:   Pleasant AA female appears to be in NAD, Well developed, Well nourished, alert and cooperative Head:  Normocephalic and atraumatic. Eyes:   PEERL, EOMI. No icterus. Conjunctiva pink. Ears:  Normal auditory acuity. Neck:  Supple Throat: Oral cavity and pharynx without inflammation, swelling or lesion.  Lungs: Respirations even and unlabored. Lungs clear to auscultation bilaterally.   No wheezes, crackles, or rhonchi.  Heart: Normal S1, S2. No MRG. Regular  rate and rhythm. No peripheral edema, cyanosis or pallor.  Abdomen:  Soft, nondistended, moderate ttp with involuntary guarding on the left. Normal bowel sounds. No appreciable masses or hepatomegaly. Rectal:  Not performed.  Msk:  Symmetrical without gross deformities. Peripheral pulses intact. +right thigh PICC line Extremities:  Without edema, no deformity or joint abnormality.  Neurologic:  Alert and  oriented x4;  grossly normal neurologically.  Skin:   Dry and intact without significant lesions or rashes. Psychiatric: Demonstrates good judgement and reason without abnormal affect or behaviors.   LAB RESULTS: Recent Labs    11/27/21 1220 11/27/21 2126 11/28/21 0338  WBC 2.9* 2.5* 2.3*  HGB 8.0* 8.6* 7.5*  HCT 24.0* 25.9* 22.7*  PLT 147* 158 138*   BMET Recent Labs    11/27/21 1220 11/27/21 2126 11/28/21 0338  NA 134*  --  136  K 4.7  --  4.4  CL 104  --  102  CO2 25  --  24  GLUCOSE 87  --  70  BUN 46*  --  37*  CREATININE 2.29* 2.17* 1.91*  CALCIUM 9.2  --  9.2   LFT Recent Labs    11/28/21 0338  PROT 5.9*  ALBUMIN 2.5*  AST 110*  ALT 307*  ALKPHOS 386*  BILITOT 1.0    STUDIES: CT ABDOMEN PELVIS WO CONTRAST  Result Date: 11/27/2021 CLINICAL DATA:  Generalized abdominal pain, history of Crohn disease, recent liver biopsy EXAM: CT ABDOMEN AND PELVIS WITHOUT CONTRAST TECHNIQUE: Multidetector CT imaging of the abdomen and pelvis was performed following the standard protocol without IV contrast. Unenhanced CT was performed per clinician order. Lack of IV contrast limits sensitivity and specificity, especially for evaluation of abdominal/pelvic solid viscera. COMPARISON:  11/11/2021, 11/11/2020 FINDINGS: Lower chest: No acute pleural or parenchymal lung disease. Hepatobiliary: Biliary dilation and pneumobilia are again identified, with high attenuation material within the intrahepatic and extrahepatic bile ducts likely refluxed oral contrast suggesting prior  sphincterotomy or incompetent sphincter. Otherwise unremarkable unenhanced appearance of the liver. Gallbladder is absent. Pancreas: Unremarkable unenhanced appearance. Spleen: Unremarkable unenhanced appearance. Adrenals/Urinary Tract: No urinary tract calculi or obstructive uropathy. The adrenals are difficult to appreciate on this unenhanced exam. Bladder is unremarkable. Stomach/Bowel: Evaluation is limited without  IV contrast. Oral contrast has traversed into the colon by the time of imaging. Multiple enterotomy staple lines are again seen within the mid abdomen. There is wall thickening of the distal colon, extending from the splenic flexure through the rectosigmoid junction. Scattered colonic gas fluid levels are noted. Vascular/Lymphatic: Right femoral PICC again identified, tip at the level of the hepatic veins. No other significant vascular finding on this limited unenhanced exam. No discrete adenopathy. Reproductive: Status post hysterectomy. No adnexal masses. Other: No free fluid or free intraperitoneal gas. No abdominal wall hernia. Musculoskeletal: No acute or destructive bony lesions. Reconstructed images demonstrate no additional findings. IMPRESSION: 1. Distal colonic wall thickening consistent with inflammatory or infectious colitis. 2. No evidence of bowel obstruction or ileus. Scattered distal colonic gas fluid levels may reflect diarrhea or recent laxative/enema use. 3. Stable biliary duct dilation, with increased pneumobilia and reflux of oral contrast into the biliary tree. This likely reflects previous sphincterotomy. 4. Right femoral PICC, tip at level of the hepatic veins. Electronically Signed   By: Randa Ngo M.D.   On: 11/27/2021 15:58   DG Chest Port 1 View  Result Date: 11/27/2021 CLINICAL DATA:  Sixty fever EXAM: PORTABLE CHEST 1 VIEW COMPARISON:  October 07, 2021 FINDINGS: The heart size and mediastinal contours are stable. Both lungs are clear. The visualized skeletal  structures are unremarkable. IMPRESSION: No active disease. Electronically Signed   By: Abelardo Diesel M.D.   On: 11/27/2021 14:14     Impression / Plan:   Impression: 1.  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 difficile negative, GI path panel pending, has recently been on Levaquin outpatient and antibiotics are on hold for now, abdominal pain today is somewhat similar to her baseline, stools are almost at baseline as well 2.  Chronically elevated LFTs: Thought related to Dili versus possibly TPN, patient did undergo a liver biopsy at last admission, see results in HPI, these appear stable/decreased since recent admission 3.  SIRS: Leukopenia and reported fever at home, concern for colitis on imaging with suspected Crohn's flare, history of recurrent catheter associated infections, blood cultures pending 4.  AKI: Likely prerenal related to poor p.o. intake Baseline creatinine around 1.5 (presenting creatinine 2.29)--> 1.91 today  5.  Bacteremia associated with intravascular line:1/4 blood cx from previous hospitalization with stenotrophomonas maltophilia and pseudomonas putida, discharged on Levaquin 500 mg daily and Ciprofloxacin lock at least 2 hours daily x2 weeks with end date of 11/27/2021, ID has been consulted for additional recommendations 6.  PICC line in place 7.  Pancreatic insufficiency: On Creon  Plan: 1.  Await results from GI pathogen panel 2.  Agree with IDs input in regards to additional antibiotics/their recommendations 3.  Agree with holding steroids for now 4.  Encouraged the patient to eat her clear liquid diet, if she does well with this we can discuss  increasing 5.  Continue home medications 6.  Reviewed patient's recent liver biopsy results.  Likely minimal elevation now is still from the same.  Thank you for your kind consultation, we will continue to follow.  Lavone Nian [facility]  11/28/2021, 8:56 AM

## 2021-11-28 NOTE — Consult Note (Addendum)
[facility] for Infectious Disease    Date of Admission:  11/27/2021     Reason for Consult: fungemia    Referring Provider: Renae Fickle      Lines:  Old right femoral line  Abx: 1/03-c anidulafungin        Assessment: Yeast/fungemia Recurrent central line associated blood stream infection Crohn's disease Short gut syndrome  Abdominal pain/diarrhea Lft elevation   She presented with 2 days gi sx and fever. Has similar presentation with blood stream infection previously. GI follow as well to r/o crohn's flare  She just finished 2 weeks of abx lock and iv levofloxacin for pseudomonas putida and steno CLABSI, and just prior to that has guidewire catheter exchange of the right femoral line as it wasn't functioning, just after finishing another 2 weeks abx lock therapy/systemic abx for acinetobacter/klebsiella bsi  She is awared of progressive limitation of line access. At this time she continues to desire full treatment; she is functional/healthy otherwise.  Reviewed line care with her -- appears she is cooperating with [facility] and doing what she is told to do  Difficult situation   Of note, she recently has very high lft elevation, which is much better now. She had a liver biopsy by IR on 12/20 that showed mild nonspecific portal inflammation and nonspecific scattered intracytoplasmic inclusion similar to [place] bodies-like inclusions (ddx include crohns, drugs, hep b, gvhd, dm2 related), but no fibrosis  Plan: Will need eventual outpatient ophthalmology exam to r/o involvement of the eye by fungemia (no sx now) TTE Repeat blood cx tomorrow Needs the femoral line removed -- have discussed with primary team/IR ideally sooner better by tomorrow Will need a line holiday of 3 days from line removal/persistent repeat negative blood culture prior to placement of new line. 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 female here with fever and abd pain/n/diarrhea in setting of fungemia  She is well known to id service  She just finished 2 weeks abx lock/systemic iv abx for pseudomonas/acinetobacter clabsi. She has had multiple clabsi more frequently the last several months. This is despite line exchange. She has had difficult access with her lines. She was told before by [place] and different providers regarding her difficult situation  She has an 26 year old grandson that was having URI sx. She developed nausea/abd pain/dairrhea 2 days ago and then fever started, prompting her to seek care  She has no visual sx, new joint pain, or rash  She doesn't have recorded fever here. Blood culture on admission grew yeast by bcid. Gi following for her crohn's and abd pain/diarrhea(nonbloody) and ct mention of colonic thickening  She is feeling much better with the eraxis, but still have abd pain  Reviewed with her line care/HH instruction and she  admits doing what she is told and cooperative with [facility]      Family History  Problem Relation Age of Onset   Seizures Mother    Glaucoma Mother    CAD Father    Heart disease Father    Hypertension Father    Breast cancer Sister    Multiple sclerosis Sister    Diabetes Sister    Lupus Sister    Colon cancer Other    Crohn's disease  Other     Social History   Tobacco Use   Smoking status: Former    Packs/day: 0.50    Years: 35.00    Pack years: 17.50    Types: Cigarettes   Smokeless tobacco: Never  Vaping Use   Vaping Use: Never used  Substance Use Topics   Alcohol use: Not Currently   Drug use: Never    Allergies  Allergen Reactions   Meperidine Hives    Other reaction(s): GI Upset Due to [place]    Hyoscyamine Hives and Swelling    Legs swelling Disorientation   Cefepime Other (See Comments)    Neurotoxicity occurring in setting of AKI. Ceftriaxone tolerated during same admit   Gabapentin Other (See Comments)    unknown   Lyrica [Pregabalin] Other (See Comments)    Has chronic dehydration and made it worse   Topamax [Topiramate] Other (See Comments)    Has chronic dehydration and made it worse   Zosyn [Piperacillin Sod-Tazobactam So] Other (See Comments)    Patient reports it makes her vomit, her neck stiff, and her "heart feel funny" Affected kidneys   Fentanyl Rash    Pt is allergic to fentanyl patch related to the glue (gives her a rash) Pt states she is NOT allergic to fentanyl IV medicine   Morphine And Related Rash    Review of Systems: ROS All Other ROS was negative, except mentioned above   Past Medical History:  Diagnosis Date   Acute pancreatitis 04/13/2020   Anasarca 10/2019   AVN (avascular necrosis of bone) ([place])    Cataract    Choledocholithiasis (sludge) s/p ERCP 10/2019 10/21/2020   Chronic pain syndrome    CKD (chronic kidney disease), stage III ([name])    Depression    Diverticulosis    GERD (gastroesophageal reflux disease)    HTN (hypertension)    IDA (iron deficiency anemia)    Malnutrition ([place])    Mass in chest    Osteoporosis 12/24/2014   Pancreatitis    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; 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; jejunal to transverse colon anastomosis Treated at [facility] lengthening to 165cm Dr Alene Mires, [facility]   Vitamin B12 deficiency        Scheduled Meds:  amLODipine  10 mg Oral Daily   buPROPion ER  200 mg Oral Daily   calcium carbonate  1,250 mg Oral Q breakfast   carvedilol  12.5 mg Oral BID WC   Chlorhexidine Gluconate Cloth  6 each Topical Daily   cholecalciferol  1,000 Units Oral Daily   cycloSPORINE  1 drop Both Eyes BID   DULoxetine  120 mg Oral Daily   estradiol  2 mg Oral Daily   feeding supplement  1 Container Oral TID BM   heparin injection (subcutaneous)  5,000 Units Subcutaneous Q8H   [START ON 11/29/2021] heparin injection (subcutaneous)  5,000 Units Subcutaneous  Q8H   hydrALAZINE  100 mg Oral TID   HYDROmorphone  4 mg Oral QID   levETIRAcetam  500 mg Oral BID   lipase/protease/amylase  36,000 Units Oral TID with meals   methadone  5 mg Oral 5 X Daily   methocarbamol  500 mg Oral BID   multivitamin with minerals   Oral Daily   pantoprazole  40 mg Oral Daily   pantoprazole (PROTONIX) IV  40 mg Intravenous Q24H   peg 3350 powder  0.5 kit Oral Once   And   peg 3350 powder  0.5 kit Oral Once   sodium chloride flush  10-40 mL Intracatheter Q12H   sucralfate  1 g Oral BID AC   Teduglutide (rDNA)  1.5 mg Subcutaneous Daily   vitamin B-12  1,000 mcg Oral Daily   Continuous Infusions:  sodium chloride 125 mL/hr at 11/28/21 1055   [START ON 11/29/2021] anidulafungin     PRN Meds:.HYDROmorphone (DILAUDID) injection, HYDROmorphone (DILAUDID) injection, ondansetron **OR** ondansetron (ZOFRAN) IV, polyvinyl alcohol   OBJECTIVE: Blood pressure 127/66, pulse 71, temperature 98 F (36.7 C), temperature source Oral, resp. rate 20, height 5' 8"  (1.727 m), weight 61.2 kg, SpO2 97 %.  Physical Exam  General/constitutional: no distress, pleasant HEENT: Normocephalic, PER, Conj Clear, EOMI, Oropharynx clear Neck supple CV: rrr no mrg Lungs: clear to auscultation, normal respiratory  effort Abd: Soft, mildly tender; no rebound Ext: no edema Skin: No Rash Neuro: nonfocal MSK: no peripheral joint swelling/tenderness/warmth; back spines nontender   Central line presence: right fem line site no erythema/fluctuance/tenderness   Lab Results Lab Results  Component Value Date   WBC 2.3 (L) 11/28/2021   HGB 7.5 (L) 11/28/2021   HCT 22.7 (L) 11/28/2021   MCV 93.8 11/28/2021   PLT 138 (L) 11/28/2021    Lab Results  Component Value Date   CREATININE 1.91 (H) 11/28/2021   BUN 37 (H) 11/28/2021   NA 136 11/28/2021   K 4.4 11/28/2021   CL 102 11/28/2021   CO2 24 11/28/2021    Lab Results  Component Value Date   ALT 307 (H) 11/28/2021   AST 110 (H) 11/28/2021   ALKPHOS 386 (H) 11/28/2021   BILITOT 1.0 11/28/2021      Microbiology: Recent Results (from the past 240 hour(s))  Blood culture (routine x 2)     Status: Abnormal (Preliminary result)   Collection Time: 11/27/21 12:20 PM   Specimen: BLOOD  Result Value Ref Range Status   Specimen Description   Final    BLOOD RIGHT ANTECUBITAL Performed at [facility], [place] [address] [place], [place] [address]    Special Requests   Final    BOTTLES DRAWN AEROBIC AND ANAEROBIC Blood Culture adequate volume Performed at [facility] [place] [address], [place], [place] [address]    Culture  Setup Time (A)  Final    YEAST AEROBIC BOTTLE ONLY CRITICAL RESULT CALLED TO, READ BACK BY AND VERIFIED WITH: PHARM D M.Berline Lopes ON [id number] BY E.PARRISH Performed at [facility], [place] [address] [place], [place] [address]    Culture YEAST  Final   Report Status PENDING  Incomplete  Blood Culture ID Panel (Reflexed)     Status: None   Collection Time: 11/27/21 12:20 PM  Result Value Ref Range Status   Enterococcus faecalis NOT DETECTED NOT DETECTED Final   Enterococcus Faecium NOT DETECTED NOT DETECTED Final   Listeria monocytogenes NOT DETECTED NOT DETECTED Final   Staphylococcus  species NOT DETECTED NOT DETECTED Final   Staphylococcus aureus (BCID) NOT DETECTED NOT DETECTED Final   Staphylococcus epidermidis NOT DETECTED NOT DETECTED Final   Staphylococcus lugdunensis NOT DETECTED NOT DETECTED Final   Streptococcus species NOT DETECTED NOT DETECTED Final   Streptococcus agalactiae NOT DETECTED NOT DETECTED Final   Streptococcus pneumoniae NOT DETECTED NOT DETECTED Final   Streptococcus pyogenes NOT DETECTED NOT DETECTED Final   A.calcoaceticus-baumannii NOT DETECTED NOT DETECTED Final   Bacteroides fragilis NOT DETECTED NOT DETECTED Final   Enterobacterales NOT DETECTED NOT DETECTED Final   Enterobacter cloacae complex NOT DETECTED NOT DETECTED Final   Escherichia coli NOT DETECTED NOT DETECTED Final   Klebsiella aerogenes NOT DETECTED NOT DETECTED Final   Klebsiella oxytoca NOT DETECTED NOT DETECTED Final   Klebsiella pneumoniae NOT DETECTED NOT DETECTED Final   Proteus species NOT DETECTED NOT DETECTED Final   Salmonella species NOT DETECTED NOT DETECTED Final   Serratia marcescens NOT DETECTED NOT DETECTED Final   Haemophilus influenzae NOT DETECTED NOT DETECTED Final   Neisseria meningitidis NOT DETECTED NOT DETECTED Final   Pseudomonas aeruginosa NOT DETECTED NOT DETECTED Final   Stenotrophomonas maltophilia NOT DETECTED NOT DETECTED Final   Candida albicans NOT DETECTED NOT DETECTED Final   Candida auris NOT DETECTED NOT DETECTED Final   Candida glabrata NOT DETECTED NOT DETECTED Final   Candida krusei NOT DETECTED NOT DETECTED Final   Candida parapsilosis NOT DETECTED NOT DETECTED Final   Candida tropicalis NOT DETECTED NOT DETECTED Final   Cryptococcus neoformans/gattii NOT DETECTED NOT DETECTED Final    Comment: Performed at [facility], [address] [place], [place] [address]  C Difficile Quick Screen w PCR reflex     Status: None   Collection Time: 11/28/21  1:30 AM   Specimen: STOOL  Result Value Ref Range Status   C Diff antigen  NEGATIVE NEGATIVE Final   C Diff toxin NEGATIVE NEGATIVE Final   C Diff interpretation No C. difficile detected.  Final    Comment: Performed at [facility], [place] [address] [place], [place] [address]     Serology:    Imaging: If present, new imagings (plain films, ct scans, and mri) have been personally visualized and interpreted; radiology reports have been reviewed. Decision making incorporated into the Impression / Recommendations.  11/27/21 ct abd pelv without contrast 1. Distal colonic wall thickening consistent with inflammatory or infectious colitis. 2. No evidence of bowel obstruction or ileus. Scattered distal colonic gas fluid levels may reflect diarrhea or recent laxative/enema use. 3. Stable biliary duct dilation, with increased pneumobilia and reflux of oral contrast into the biliary tree. This likely reflects previous sphincterotomy. 4. Right femoral PICC, tip at level of the hepatic veins.   11/27/21 cxr No active disease  Jabier Mutton, MD [facility] [place] [phone] pager    11/28/2021, 2:31 PM

## 2021-11-28 NOTE — H&P (View-Only) (Signed)
Consultation  Referring Provider: Dr. Zigmund Daniel    Primary Care Physician:  [name], [name], MD Primary Gastroenterologist: [facility] (August saw in the last 30 days at the hospital)        Reason for Consultation:   Abdominal pain and fever         HPI:   Caitlin Peters is a 61 y.o. 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    Of note see recent hospital admission with fever and abdominal pain found to have multiple forms of bacteria, discharged on Levaquin and Cipro intracatheter lock with plans to continue until 11/27/2021.    Today, patient explains that since she left the hospital a couple of weeks ago immediately upon returning home she felt like her abdomen was hard and and she could hear her intestines working, "which is usual for my flares of colitis".  Then she started with increasing abdominal pain, nausea and vomiting since Friday evening which continued and worsened and prompted her return to the ER.  Tells me she has continued with loose stools which is "normal for me with my short gut", this seems to be controlled well with her medicine and does not necessarily seem to be increased at all, no hematochezia.  She does tell me that she continues with abdominal pain but some of this is chronic ever since she had "my bowel cut out".  Tells me this is always worse on the left side and specifically after eating.  Patient was worried that she may have a catheter infection again due to symptoms.  Tells me since admission she has had no further vomiting and in fact feels slightly  better.  Asking if she can try to eat something today.  Associated symptoms include a fever of 100.7 with some chills and a headache as well as a decreased appetite.     Patient does tell me it is been a long time since that she has been on steroids for colitis because "it just stopped working"    Denies hematochezia, further nausea or vomiting or symptoms that awaken her from sleep.  ED course: AKI and CTAP with distal colonic wall thickening consistent with inflammatory or infectious colitis, no evidence of bowel obstruction or ileus, scattered distal colonic gas/fluid levels which may reflect diarrhea or recent laxative/enema use, stable biliary duct dilation with increased pneumobilia and reflux of oral contrast into the biliary tree which likely reflects previous sphincterotomy and right femoral PICC, tip at the level of hepatic veins  Hospital course: C. difficile negative, GI path panel in process, CMP with continually elevated LFTs  GI history: 11/14/2021 liver biopsies showed minimal to mild nonspecific portal inflammation and 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, findings were noted is nonspecific with a differential diagnosis to include mild drug-induced injury, probably pharmacotherapy, chronic hepatitis B, type 2 diabetes, graft-versus-host disease and also reports show slight conclusions in patients on chronic parenteral nutrition for short-bowel syndrome; at that time patient told to follow-up with her primary GI at [facility] 11/13/2021 our service signed off at that time patient was continue to feel better and liver biopsy was planned, elevated  LFTs thought related to sepsis versus Dili (hydralazine use) 11/11/2021-11/14/2021 hospital admission: We were consulted at that time due to presentation with chills, body aches and abdominal pain and a fever, abdominal pain was diffuse with some nausea, also elevated LFTs; CTAP without contrast 12/17 showed mild intrahepatic,  extrahepatic biliary prominence stable, surgical changes of right hemicolectomy, chronic dilation of the small bowel fluid levels in the lower abdomen possibly increased rectal wall thickening, body wall anasarca, cardiomegaly and new finding of 5 mm nodule in the left lower lobe with cystitis versus bladder to nondistention; ultrasound showed increased echogenicity of the right kidney suggesting medical renal disease 10/07/2021-10/16/21 hospital admission: For worsening diarrhea, abdominal pain, fever and anorexia, CT showed diffuse colitis, ileus versus enteritis and dilated loops of small bowel, developing P SBO not excluded, new pneumobilia, diagnosis Klebsiella and Acinetobacter infection treated with IV Levaquin/Cipro and line left in place, LFTs elevated but resolving by discharge; a CTAP that admission showed distended loops of small bowel similar to previous, changes of right hemicolectomy and large amount of stool in the residual colon, rectum, anasarca, mild intra and extrahepatic biliary ductal dilation diffuse dilation of the pancreatic duct unchanged from previous, PICC line placed on 12/9 10/27/2020 colonoscopy: No active Crohn's, CRP normal 10/25/2020 CTAP without contrast at [facility]-showed air-filled branching tubules in the left hepatic lobe favoring pneumobilia though portal venous gas difficult to exclude, CBD distended and filled with air, small amount of enteric contrast in the distal bile duct suggesting incompetent sphincter of Oddi, extensive postsurgical changes of the small and large bowel with scattered luminal narrowing/mural thickening in the colon 02/2020 EUS/ERCP-4 CBD, PD dilation at MRCP and elevated LFTs, J-shaped formula stomach with gastric erythema, angulation deformity of the duodenum, ERCP revealed prior sphincterotomy, sphincteroplasty performed, entire main bile duct dilated, biliary tree swept with nothing found, EUS showed mild dilation of the CBD but no stones or sludge,  pancreatic duct dilated in the region of the head body and tail, no significant pancreatic parenchymal disease, nonmalignant looking lymph nodes at the celiac, peripancreatic and porta hepatis region 10/2019 ERCP-for suspected ascending cholangitis, prior cholecystectomy, Dr. Lyndel Safe found benign-appearing biliary papillary stenosis and choledocholithiasis, underwent sphincterotomy and sphincteroplasty as well as balloon sweep of bile duct with no evidence of PSC    Past Medical History:  Diagnosis Date   Acute pancreatitis 04/13/2020   Anasarca 10/2019   AVN (avascular necrosis of bone) ([place])    Cataract    Choledocholithiasis (sludge) s/p ERCP 10/2019 10/21/2020   Chronic pain syndrome    CKD (chronic kidney disease), stage III ([name])    Depression    Diverticulosis    GERD (gastroesophageal reflux disease)    HTN (hypertension)    IDA (iron deficiency anemia)    Malnutrition ([place])    Mass in chest    Osteoporosis 12/24/2014   Pancreatitis    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; 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; jejunal to transverse colon anastomosis Treated at [place] SB lengthening to 165cm Dr Alene Mires, [facility]   Vitamin B12 deficiency     Past Surgical History:  Procedure Laterality Date   ABDOMINAL ADHESION SURGERY  01/22/2018   APPENDECTOMY  1989   BILIARY DILATION  11/26/2019   Procedure: BILIARY DILATION;  Surgeon: Jackquline Denmark, MD;  Location: [facility];  Service: Endoscopy;;   BILIARY DILATION  03/08/2020   Procedure:  BILIARY DILATION;  Surgeon: Rush Landmark Telford Nab., MD;  Location: [place];  Service: Gastroenterology;;   BIOPSY  03/08/2020   Procedure: BIOPSY;  Surgeon: Irving Copas., MD;  Location: [place];  Service: Gastroenterology;;   CHEST WALL RESECTION     right thoracotomy,resection of chest mass with anterior rib and  reconstruction using prosthetic mesh and video arthroscopy   CHOLECYSTECTOMY  01/22/2018   COLONOSCOPY  2019   ENTEROSTOMY CLOSURE  04/1999   ERCP N/A 11/26/2019   Procedure: ENDOSCOPIC RETROGRADE CHOLANGIOPANCREATOGRAPHY (ERCP);  Surgeon: Jackquline Denmark, MD;  Location: [facility];  Service: Endoscopy;  Laterality: N/A;   ERCP N/A 03/08/2020   Procedure: ENDOSCOPIC RETROGRADE CHOLANGIOPANCREATOGRAPHY (ERCP);  Surgeon: Irving Copas., MD;  Location: [place];  Service: Gastroenterology;  Laterality: N/A;   ESOPHAGOGASTRODUODENOSCOPY N/A 03/08/2020   Procedure: ESOPHAGOGASTRODUODENOSCOPY (EGD);  Surgeon: Irving Copas., MD;  Location: [place];  Service: Gastroenterology;  Laterality: N/A;   EUS N/A 03/08/2020   Procedure: UPPER ENDOSCOPIC ULTRASOUND (EUS) LINEAR;  Surgeon: Irving Copas., MD;  Location: [place];  Service: Gastroenterology;  Laterality: N/A;   ILEOCECETOMY  03/1999   ileocolon resection with abdominal stoma   ILEOSTOMY CLOSURE  2001   IR FLUORO GUIDE CV LINE LEFT  01/07/2020   IR FLUORO GUIDE CV LINE LEFT  03/09/2020   IR FLUORO GUIDE CV LINE LEFT  05/09/2020   IR FLUORO GUIDE CV LINE LEFT  07/20/2020   IR FLUORO GUIDE CV LINE RIGHT  08/05/2020   IR FLUORO GUIDE CV LINE RIGHT  04/03/2021   IR PTA VENOUS EXCEPT DIALYSIS CIRCUIT  01/07/2020   IR REMOVAL TUN CV CATH W/O FL  08/05/2020   IR REMOVAL TUN CV CATH W/O FL  07/11/2021   IR US GUIDE VASC ACCESS LEFT     x 2 06/17/19 and 09/14/2019   IR US GUIDE VASC ACCESS RIGHT  08/05/2020   KNEE SURGERY     right knee    LAPAROSCOPIC SMALL BOWEL RESECTION  2009   2000-2009.  SB resections for Crohns Disease - now with Short gut   OMENTECTOMY  01/22/2018   PARTIAL HYSTERECTOMY  1984   with LSO   REMOVAL OF STONES  11/26/2019   Procedure: REMOVAL OF STONES;  Surgeon: Jackquline Denmark, MD;  Location: [facility];  Service: Endoscopy;;   REMOVAL OF STONES  03/08/2020   Procedure: REMOVAL OF STONES;   Surgeon: Irving Copas., MD;  Location: [facility];  Service: Gastroenterology;;   SALPINGOOPHORECTOMY Left 1984   SALPINGOOPHORECTOMY Right 1990   SERIAL TRANSVERSE ENTEROPLASTY (STEP) - SMALL BOWEL LENGTHENING  01/22/2018   Dr Alene Mires, [facility] - SB length from 120 to 165cm    SMALL INTESTINE SURGERY  2002   SMALL INTESTINE SURGERY  2003   SPHINCTEROTOMY  11/26/2019   Procedure: SPHINCTEROTOMY;  Surgeon: Jackquline Denmark, MD;  Location: [facility];  Service: Endoscopy;;   TOTAL ABDOMINAL HYSTERECTOMY  1990   with RSO   UPPER GASTROINTESTINAL ENDOSCOPY      Family History  Problem Relation Age of Onset   Seizures Mother    Glaucoma Mother    CAD Father    Heart disease Father    Hypertension Father    Breast cancer Sister    Multiple sclerosis Sister    Diabetes Sister    Lupus Sister    Colon cancer Other    Crohn's disease Other     Social History   Tobacco Use   Smoking status:  Former    Packs/day: 0.50    Years: 35.00    Pack years: 17.50    Types: Cigarettes   Smokeless tobacco: Never  Vaping Use   Vaping Use: Never used  Substance Use Topics   Alcohol use: Not Currently   Drug use: Never    Prior to Admission medications   Medication Sig Start Date End Date Taking? Authorizing Provider  acetaminophen (TYLENOL) 650 MG CR tablet Take 1,300 mg by mouth every 6 (six) hours.   Yes [provider]  amLODipine (NORVASC) 10 MG tablet Take 1 tablet by mouth once daily Patient taking differently: Take 10 mg by mouth daily. 08/22/21  Yes [name], [name], MD  buPROPion ER [facility] SR) 100 MG 12 hr tablet Take 2 tablets by mouth once daily Patient taking differently: Take 200 mg by mouth daily. 08/30/21  Yes [name], [name], MD  Calcium 500 MG tablet Take 500 mg by mouth daily.   Yes [provider]  carvedilol (COREG) 12.5 MG tablet Take 1 tablet (12.5 mg total) by mouth 2 (two) times daily with a meal.  09/29/21  Yes Little Ishikawa, MD  cholecalciferol (VITAMIN D3) 25 MCG (1000 UT) tablet Take 1,000 Units by mouth daily.    Yes [provider]  ciprofloxacin (CIPRO) 400 MG/200ML SOLN Inject 400 mg into the vein daily. 11/16/21  Yes [provider]  cycloSPORINE (RESTASIS) 0.05 % ophthalmic emulsion Place 1 drop into both eyes 2 (two) times daily.   Yes [provider]  DEXILANT 60 MG capsule Take 1 capsule by mouth once daily 11/21/21  Yes [name], [name], MD  diphenoxylate-atropine (LOMOTIL) 2.5-0.025 MG tablet Take 1 tablet by mouth 2 (two) times daily as needed for diarrhea or loose stools. 09/04/21  Yes [provider]  DULoxetine (CYMBALTA) 60 MG capsule Take 2 capsules (120 mg total) by mouth daily. 09/15/21  Yes [name], [name], MD  estradiol (ESTRACE) 2 MG tablet Take 1 tablet by mouth once daily Patient taking differently: Take 2 mg by mouth daily. 10/03/21  Yes [name], [name], MD  famotidine (PEPCID) 20 MG tablet Take 20 mg by mouth daily as needed for heartburn or indigestion.   Yes [provider]  GATTEX 5 MG KIT Inject 1.5 mg into the skin daily. 01/03/21  Yes [provider]  hydrALAZINE (APRESOLINE) 100 MG tablet Take 1 tablet (100 mg total) by mouth 3 (three) times daily. 11/07/21  Yes [name], [name], MD  HYDROmorphone (DILAUDID) 4 MG tablet Take 4 mg by mouth 4 (four) times daily. 04/18/21  Yes [provider]  levETIRAcetam (KEPPRA) 500 MG tablet Take 1 tablet (500 mg total) by mouth 2 (two) times daily. 08/28/21  Yes Cameron Sprang, MD  levofloxacin (LEVAQUIN) 500 MG/100ML SOLN Inject 100 mLs (500 mg total) into the vein daily for 12 days. Indication: Stenotrophomonas/Psuedomonas Bacteremia First Dose: Yes  Last Day of Therapy: 11/27/21 Labs - Once weekly: CBC/D and BMP Labs - Every other week: ESR and CRP  Method of administration: IV Push Method of administration may  be changed at the discretion of home infusion pharmacist based upon assessment of the patient and/or caregiver's ability to self-administer the medication ordered 11/16/21 11/28/21 Yes Bonnielee Haff, MD  lipase/protease/amylase (CREON) 36000 UNITS CPEP capsule Take 1 capsule (36,000 Units total) by mouth See admin instructions. 36000 units with meals Patient taking differently: Take 36,000 Units by mouth 3 (three) times daily  with meals. 06/30/21  Yes Eugenie Filler, MD  loperamide (IMODIUM) 2 MG capsule Take 4 mg by mouth 2 (two) times daily as needed for diarrhea or loose stools. 09/26/21  Yes [provider]  methadone (DOLOPHINE) 5 MG tablet Take 1 tablet (5 mg total) by mouth 5 (five) times daily. 09/29/21  Yes Little Ishikawa, MD  methocarbamol (ROBAXIN) 500 MG tablet Take 1 tablet (500 mg total) by mouth in the morning and at bedtime. 06/13/21  Yes [name], [name], MD  Multiple Vitamins-Minerals (MULTIVITAMIN ADULT PO) Take 1 tablet by mouth daily.   Yes [provider]  NARCAN 4 MG/0.1ML LIQD nasal spray kit Place 1 spray into the nose once as needed (overdose). 10/14/20  Yes [provider]  ondansetron (ZOFRAN) 4 MG tablet Take 4 mg by mouth every 8 (eight) hours as needed for nausea or vomiting. 11/18/21  Yes [provider]  polyvinyl alcohol (LIQUIFILM TEARS) 1.4 % ophthalmic solution Place 1 drop into both eyes daily as needed for dry eyes.   Yes [provider]  PRESCRIPTION MEDICATION Inject 1 each into the vein daily. Home TPN . [facility] . 1 bag for 12 hours. [phone]   Yes [provider]  promethazine (PHENERGAN) 25 MG tablet TAKE 1 TABLET BY MOUTH EVERY 6 HOURS AS NEEDED FOR NAUSEA FOR VOMITING Patient taking differently: Take 25 mg by mouth every 8 (eight) hours as needed for vomiting or nausea. 11/16/21  Yes [name], [name], MD  sodium chloride 0.9 % infusion Inject  1,000 mLs into the vein daily. For chronic dehydration 10/16/20  Yes [provider]  sucralfate (CARAFATE) 1 GM/10ML suspension Take 10 mLs (1 g total) by mouth 2 (two) times daily. 06/30/21  Yes Eugenie Filler, MD  Trace Minerals Cu-Mn-Se-Zn (TRALEMENT IV) Inject 1 mL into the vein See admin instructions. Used in TPN bag 4 times weekly   Yes [provider]  vitamin B-12 (CYANOCOBALAMIN) 1000 MCG tablet Take 1,000 mcg by mouth daily.    Yes [provider]  ciprofloxacin 0.2 mg/ml + heparin 5000 units/ml SOLN 2.5 mLs by Intracatheter route daily for 12 days. Dwell in PICC lumen #2 for a minimum of 2 hours daily when PICC not in use End date 11/27/21 Patient not taking: Reported on 11/27/2021 11/16/21 11/28/21  Bonnielee Haff, MD    Current Facility-Administered Medications  Medication Dose Route Frequency Provider Last Rate Last Admin   amLODipine (NORVASC) tablet 10 mg  10 mg Oral Daily Elodia Florence., MD   10 mg at 11/27/21 2212   buPROPion ER [facility] SR) 12 hr tablet 200 mg  200 mg Oral Daily Elodia Florence., MD   200 mg at 11/27/21 2215   calcium carbonate (OS-CAL - dosed in mg of elemental calcium) tablet 1,250 mg  1,250 mg Oral Q breakfast Elodia Florence., MD       carvedilol (COREG) tablet 12.5 mg  12.5 mg Oral BID WC Elodia Florence., MD       Chlorhexidine Gluconate Cloth 2 % PADS 6 each  6 each Topical Daily Elodia Florence., MD       cholecalciferol (VITAMIN D3) tablet 1,000 Units  1,000 Units Oral Daily Elodia Florence., MD       cycloSPORINE (RESTASIS) 0.05 % ophthalmic emulsion 1 drop  1 drop Both Eyes BID Elodia Florence., MD  1 drop at 11/27/21 2216   DULoxetine (CYMBALTA) DR capsule 120 mg  120 mg Oral Daily Elodia Florence., MD   120 mg at 11/27/21 2214   estradiol (ESTRACE) tablet 2 mg  2 mg Oral Daily Elodia Florence., MD   2 mg at 11/27/21 2215   heparin injection 5,000 Units  5,000 Units  Subcutaneous Q8H Elodia Florence., MD   5,000 Units at 11/28/21 0543   hydrALAZINE (APRESOLINE) tablet 100 mg  100 mg Oral TID Elodia Florence., MD   100 mg at 11/27/21 2213   HYDROmorphone (DILAUDID) injection 0.5-1 mg  0.5-1 mg Intravenous Q2H PRN Elodia Florence., MD   1 mg at 11/28/21 0700   HYDROmorphone (DILAUDID) injection 1 mg  1 mg Intravenous Q2H PRN Elodia Florence., MD   1 mg at 11/27/21 2231   HYDROmorphone (DILAUDID) tablet 4 mg  4 mg Oral QID Elodia Florence., MD   4 mg at 11/27/21 2213   lactated ringers infusion   Intravenous Continuous Elodia Florence., MD 125 mL/hr at 11/28/21 0542 New Bag at 11/28/21 0542   levETIRAcetam (KEPPRA) tablet 500 mg  500 mg Oral BID Elodia Florence., MD   500 mg at 11/27/21 2214   lipase/protease/amylase (CREON) capsule 36,000 Units  36,000 Units Oral TID with meals Elodia Florence., MD       methadone (DOLOPHINE) tablet 5 mg  5 mg Oral 5 X Daily Elodia Florence., MD   5 mg at 11/28/21 0546   methocarbamol (ROBAXIN) tablet 500 mg  500 mg Oral BID Elodia Florence., MD   500 mg at 11/27/21 2213   multivitamin with minerals tablet   Oral Daily Elodia Florence., MD       ondansetron [facility]) tablet 4 mg  4 mg Oral Q6H PRN Elodia Florence., MD       Or   ondansetron [facility]) injection 4 mg  4 mg Intravenous Q6H PRN Elodia Florence., MD   4 mg at 11/27/21 1814   pantoprazole (PROTONIX) EC tablet 40 mg  40 mg Oral Daily Elodia Florence., MD   40 mg at 11/27/21 2213   pantoprazole (PROTONIX) injection 40 mg  40 mg Intravenous Q24H Elodia Florence., MD   40 mg at 11/27/21 1751   polyvinyl alcohol (LIQUIFILM TEARS) 1.4 % ophthalmic solution 1 drop  1 drop Both Eyes Daily PRN Elodia Florence., MD       sodium chloride flush (NS) 0.9 % injection 10-40 mL  10-40 mL Intracatheter Q12H Elodia Florence., MD       sucralfate (CARAFATE) 1 GM/10ML suspension 1 g  1 g  Oral BID AC Elodia Florence., MD       Teduglutide (rDNA) KIT 1.5 mg  1.5 mg Subcutaneous Daily Elodia Florence., MD       vitamin B-12 (CYANOCOBALAMIN) tablet 1,000 mcg  1,000 mcg Oral Daily Elodia Florence., MD        Allergies as of 11/27/2021 - Review Complete 11/27/2021  Allergen Reaction Noted   Meperidine Hives 08/14/2011   Hyoscyamine Hives and Swelling 07/15/2014   Cefepime Other (See Comments) 11/27/2017   Gabapentin Other (See Comments) 10/13/2019   Lyrica [pregabalin] Other (See Comments) 10/13/2019   Topamax [topiramate] Other (See Comments) 10/13/2019   Zosyn [piperacillin sod-tazobactam so] Other (  See Comments) 08/03/2020   Fentanyl Rash 10/12/2019   Morphine and related Rash 10/12/2019     Review of Systems:    Constitutional: No weight loss Skin: No rash Cardiovascular: No chest pain Respiratory: No SOB  Gastrointestinal: See HPI and otherwise negative Genitourinary: No dysuria Neurological: No headache, dizziness or syncope Musculoskeletal: No new muscle or joint pain Hematologic: No bleeding Psychiatric: No history of depression or anxiety    Physical Exam:  Vital signs in last 24 hours: Temp:  [97.6 F (36.4 C)-98.8 F (37.1 C)] 97.7 F (36.5 C) (01/03 0542) Pulse Rate:  [59-74] 70 (01/03 0542) Resp:  [15-18] 18 (01/03 0542) BP: (130-197)/(60-89) 141/60 (01/03 0542) SpO2:  [97 %-100 %] 97 % (01/03 0542) Weight:  [61.2 kg] 61.2 kg (01/02 1225) Last BM Date: 11/27/21 General:   Pleasant AA female appears to be in NAD, Well developed, Well nourished, alert and cooperative Head:  Normocephalic and atraumatic. Eyes:   PEERL, EOMI. No icterus. Conjunctiva pink. Ears:  Normal auditory acuity. Neck:  Supple Throat: Oral cavity and pharynx without inflammation, swelling or lesion.  Lungs: Respirations even and unlabored. Lungs clear to auscultation bilaterally.   No wheezes, crackles, or rhonchi.  Heart: Normal S1, S2. No MRG. Regular  rate and rhythm. No peripheral edema, cyanosis or pallor.  Abdomen:  Soft, nondistended, moderate ttp with involuntary guarding on the left. Normal bowel sounds. No appreciable masses or hepatomegaly. Rectal:  Not performed.  Msk:  Symmetrical without gross deformities. Peripheral pulses intact. +right thigh PICC line Extremities:  Without edema, no deformity or joint abnormality.  Neurologic:  Alert and  oriented x4;  grossly normal neurologically.  Skin:   Dry and intact without significant lesions or rashes. Psychiatric: Demonstrates good judgement and reason without abnormal affect or behaviors.   LAB RESULTS: Recent Labs    11/27/21 1220 11/27/21 2126 11/28/21 0338  WBC 2.9* 2.5* 2.3*  HGB 8.0* 8.6* 7.5*  HCT 24.0* 25.9* 22.7*  PLT 147* 158 138*   BMET Recent Labs    11/27/21 1220 11/27/21 2126 11/28/21 0338  NA 134*  --  136  K 4.7  --  4.4  CL 104  --  102  CO2 25  --  24  GLUCOSE 87  --  70  BUN 46*  --  37*  CREATININE 2.29* 2.17* 1.91*  CALCIUM 9.2  --  9.2   LFT Recent Labs    11/28/21 0338  PROT 5.9*  ALBUMIN 2.5*  AST 110*  ALT 307*  ALKPHOS 386*  BILITOT 1.0    STUDIES: CT ABDOMEN PELVIS WO CONTRAST  Result Date: 11/27/2021 CLINICAL DATA:  Generalized abdominal pain, history of Crohn disease, recent liver biopsy EXAM: CT ABDOMEN AND PELVIS WITHOUT CONTRAST TECHNIQUE: Multidetector CT imaging of the abdomen and pelvis was performed following the standard protocol without IV contrast. Unenhanced CT was performed per clinician order. Lack of IV contrast limits sensitivity and specificity, especially for evaluation of abdominal/pelvic solid viscera. COMPARISON:  11/11/2021, 11/11/2020 FINDINGS: Lower chest: No acute pleural or parenchymal lung disease. Hepatobiliary: Biliary dilation and pneumobilia are again identified, with high attenuation material within the intrahepatic and extrahepatic bile ducts likely refluxed oral contrast suggesting prior  sphincterotomy or incompetent sphincter. Otherwise unremarkable unenhanced appearance of the liver. Gallbladder is absent. Pancreas: Unremarkable unenhanced appearance. Spleen: Unremarkable unenhanced appearance. Adrenals/Urinary Tract: No urinary tract calculi or obstructive uropathy. The adrenals are difficult to appreciate on this unenhanced exam. Bladder is unremarkable. Stomach/Bowel: Evaluation is limited without  IV contrast. Oral contrast has traversed into the colon by the time of imaging. Multiple enterotomy staple lines are again seen within the mid abdomen. There is wall thickening of the distal colon, extending from the splenic flexure through the rectosigmoid junction. Scattered colonic gas fluid levels are noted. Vascular/Lymphatic: Right femoral PICC again identified, tip at the level of the hepatic veins. No other significant vascular finding on this limited unenhanced exam. No discrete adenopathy. Reproductive: Status post hysterectomy. No adnexal masses. Other: No free fluid or free intraperitoneal gas. No abdominal wall hernia. Musculoskeletal: No acute or destructive bony lesions. Reconstructed images demonstrate no additional findings. IMPRESSION: 1. Distal colonic wall thickening consistent with inflammatory or infectious colitis. 2. No evidence of bowel obstruction or ileus. Scattered distal colonic gas fluid levels may reflect diarrhea or recent laxative/enema use. 3. Stable biliary duct dilation, with increased pneumobilia and reflux of oral contrast into the biliary tree. This likely reflects previous sphincterotomy. 4. Right femoral PICC, tip at level of the hepatic veins. Electronically Signed   By: Randa Ngo M.D.   On: 11/27/2021 15:58   DG Chest Port 1 View  Result Date: 11/27/2021 CLINICAL DATA:  Sixty fever EXAM: PORTABLE CHEST 1 VIEW COMPARISON:  October 07, 2021 FINDINGS: The heart size and mediastinal contours are stable. Both lungs are clear. The visualized skeletal  structures are unremarkable. IMPRESSION: No active disease. Electronically Signed   By: Abelardo Diesel M.D.   On: 11/27/2021 14:14     Impression / Plan:   Impression: 1.  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 difficile negative, GI path panel pending, has recently been on Levaquin outpatient and antibiotics are on hold for now, abdominal pain today is somewhat similar to her baseline, stools are almost at baseline as well 2.  Chronically elevated LFTs: Thought related to Dili versus possibly TPN, patient did undergo a liver biopsy at last admission, see results in HPI, these appear stable/decreased since recent admission 3.  SIRS: Leukopenia and reported fever at home, concern for colitis on imaging with suspected Crohn's flare, history of recurrent catheter associated infections, blood cultures pending 4.  AKI: Likely prerenal related to poor p.o. intake Baseline creatinine around 1.5 (presenting creatinine 2.29)--> 1.91 today  5.  Bacteremia associated with intravascular line:1/4 blood cx from previous hospitalization with stenotrophomonas maltophilia and pseudomonas putida, discharged on Levaquin 500 mg daily and Ciprofloxacin lock at least 2 hours daily x2 weeks with end date of 11/27/2021, ID has been consulted for additional recommendations 6.  PICC line in place 7.  Pancreatic insufficiency: On Creon  Plan: 1.  Await results from GI pathogen panel 2.  Agree with IDs input in regards to additional antibiotics/their recommendations 3.  Agree with holding steroids for now 4.  Encouraged the patient to eat her clear liquid diet, if she does well with this we can discuss  increasing 5.  Continue home medications 6.  Reviewed patient's recent liver biopsy results.  Likely minimal elevation now is still from the same.  Thank you for your kind consultation, we will continue to follow.  Lavone Nian [id number] [facility]  11/28/2021, 8:56 AM

## 2021-11-28 NOTE — TOC Initial Note (Signed)
Transition of Care [facility]) - Initial/Assessment Note    Patient Details  Name: Caitlin Peters MRN: [id number] Date of Birth: January 19, 1961  [facility]) CM/SW Contact:    Lennart Pall, LCSW Phone Number: 11/28/2021, 1:17 PM  Clinical Narrative:                 Met with pt to review readmission risk screening and discuss dc needs. Pt very pleasant and reports that she was active with [facility] and [facility] just PTA.  She is hopeful this will be brief hospitalization and will return home with her adult daughter, Darleene Cleaver.  Denies any concerns at this point.  Would like to resume [place] follow up with agencies noted.  TOC will follow along.  Expected Discharge Plan: [place] Barriers to Discharge: Continued Medical Work up   Patient Goals and CMS Choice Patient states their goals for this hospitalization and ongoing recovery are:: return home      Expected Discharge Plan and Services Expected Discharge Plan: [place] Acute Care Choice: [place] arrangements for the past 2 months: Apartment                                      Prior Living Arrangements/Services Living arrangements for the past 2 months: Apartment Lives with:: Adult Children Patient language and need for interpreter reviewed:: Yes Do you feel safe going back to the place where you live?: Yes      Need for Family Participation in Patient Care: Yes (Comment) Care giver support system in place?: Yes (comment) Current home services: DME, Home RN Criminal Activity/Legal Involvement Pertinent to Current Situation/Hospitalization: No - Comment as needed  Activities of Daily Living Home Assistive Devices/Equipment: Walker (specify type) ADL Screening (condition at time of admission) Patient's cognitive ability adequate to safely complete daily activities?: Yes Is the patient deaf or have difficulty hearing?: No Does the patient have difficulty  seeing, even when wearing glasses/contacts?: No Does the patient have difficulty concentrating, remembering, or making decisions?: No Patient able to express need for assistance with ADLs?: Yes Does the patient have difficulty dressing or bathing?: No Independently performs ADLs?: Yes (appropriate for developmental age) Does the patient have difficulty walking or climbing stairs?: No Weakness of Legs: None Weakness of Arms/Hands: None  Permission Sought/Granted Permission sought to share information with : Family Supports Permission granted to share information with : Yes, Verbal Permission Granted  Share Information with NAME: Rosendo Gros     Permission granted to share info w Relationship: daughter  Permission granted to share info w Contact Information: [phone]  Emotional Assessment Appearance:: Appears stated age Attitude/Demeanor/Rapport: Gracious, Engaged Affect (typically observed): Accepting Orientation: : Oriented to Self, Oriented to Place, Oriented to  Time, Oriented to Situation Alcohol / Substance Use: Not Applicable Psych Involvement: No (comment)  Admission diagnosis:  Colitis [K52.9] Generalized abdominal pain [R10.84] AKI (acute kidney injury) ([place]) [N17.9] Central venous line infection, subsequent encounter [T80.219D] Crohn's disease of colon with other complication [facility]) [B40.370] Patient Active Problem List   Diagnosis Date Noted   PICC (peripherally inserted central catheter) in place 11/27/2021   Pancreatic insufficiency 11/27/2021   SBO (small bowel obstruction) ([place]) 10/08/2021   Vertebral osteomyelitis ([place])    Bloodstream infection due to central venous catheter    Thrombocytopenia ([place])    Malnutrition of  moderate degree 07/06/2021   Transaminitis    Hypophosphatemia    SIRS (systemic inflammatory response syndrome) ([name]) 06/20/2021   Chronic idiopathic constipation    Left shoulder pain 05/23/2021   Elevated LFTs    Sepsis ([place])  05/06/2021   Acidosis 04/08/2021   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   Intractable nausea and vomiting 03/11/2021   Thoracic discitis 02/10/2021   Pressure injury of skin 12/18/2020   Bacteremia 12/17/2020   Seizures ([place]) 12/17/2020   Drug-seeking behavior 12/16/2020   Seizure ([place]) 12/15/2020   Stage 4 chronic kidney disease ([place]) 12/06/2020   COVID-19 virus infection 12/06/2020   Protein calorie malnutrition ([place]) 12/06/2020   Palpitations 11/22/2020   PAC (premature atrial contraction) 11/22/2020   History of central line-associated bloodstream infection ([facility]) 10/21/2020   Nausea & vomiting 10/21/2020   Choledocholithiasis (sludge) s/p ERCP 10/2019 10/21/2020   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   Abdominal pain 20/25/4270   Acute metabolic encephalopathy    Hypomagnesemia    Partial small bowel obstruction ([name])    Bacteremia associated with intravascular line ([place]) 08/04/2020   Enterobacter sepsis ([place]) 07/22/2020   Fever of unknown origin 06/27/2020   Anxiety 06/03/2020   Acute pancreatitis 04/13/2020   Infection due to Acinetobacter species 03/10/2020   Pancytopenia ([place]) 03/05/2020   Central line infection    Elevated liver enzymes 01/02/2020   Cholangitis    Anasarca 10/28/2019   Acute kidney injury superimposed on chronic kidney disease ([place]) 10/28/2019   Falls 10/28/2019   Malnutrition ([place])    Vitamin B12 deficiency    GERD (gastroesophageal reflux disease)    Chronic pain syndrome    Hypokalemia due to excessive gastrointestinal loss of potassium 10/13/2019   Fever    Hypokalemia 10/12/2019   Chronic diarrhea 10/12/2019   Dysuria 10/12/2019   Bilateral lower extremity edema 10/12/2019   Hypertension 10/12/2019   AKI (acute kidney injury) ([place]) 10/12/2019   Candidemia ([place]) 08/27/2019   IDA (iron deficiency anemia) 11/03/2018   Dilation of biliary tract 08/28/2018   Severe diarrhea 03/07/2018   LFTs abnormal 01/27/2018   GI tract  obstruction ([place]) 01/27/2018   Bacteremia due to Klebsiella pneumoniae 09/13/2017   HTN (hypertension), benign 12/02/2016   Intractable pain 12/02/2016   Anemia 12/02/2016   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   Avascular necrosis ([place]) 06/14/2015   Polyarthralgia 05/03/2015   On total parenteral nutrition (TPN) 12/30/2014   Osteoporosis 12/24/2014   Low back pain 12/16/2014   Vitamin D deficiency 12/16/2014   Short gut syndrome 07/15/2014   History of colonic diverticulitis 2014   Depression 07/24/2012   Gram-positive bacteremia 07/24/2012   Small bowel obstruction due to adhesions ([place]) 07/24/2012   Diarrhea 12/13/2011   Crohn's disease ([place]) 06/01/2011   Neuralgia and neuritis 06/01/2011   Myalgia and myositis 08/12/2003   Crohn's disease of both small and large intestine with other complication ([place]) 6237   PCP:  [name], [name], MD Pharmacy:   [facility], [place] [place] [address] Phone: [phone] Fax: [phone]     Social Determinants of Health (SDOH) Interventions    Readmission Risk Interventions Readmission Risk Prevention Plan 11/28/2021 09/26/2021 06/22/2021  Transportation Screening Complete Complete Complete  PCP or Specialist Appt within 3-5 Days - - -  Not Complete comments - - -  [place] or [place] for [place] Planning/Counseling - - -  SW consult  not completed comments - - -  Palliative Care Screening - - -  Medication Review (RN Care Manager) Complete Complete Complete  PCP or Specialist appointment within 3-5 days of discharge Complete - Complete  HRI or Home Care Consult Complete Complete Complete  SW Recovery Care/Counseling Consult - Complete Complete  Palliative Care Screening Not Applicable Not Applicable Not [place] Not Applicable Not Applicable Not Applicable

## 2021-11-28 NOTE — Progress Notes (Signed)
PHARMACY - TOTAL PARENTERAL NUTRITION CONSULT NOTE   Indication:  short bowel syndrome,  chronic TPN  Patient Measurements: Height: 5' 8"  (172.7 cm) Weight: 61.2 kg (135 lb) IBW/kg (Calculated) : 63.9 TPN AdjBW (KG): 61.2 Body mass index is 20.53 kg/m.  Assessment: 61 year old female presenting with abdominal pain. Recently admitted to [facility], found to have Stenotrophomonas and Pseudomonas putida bacteremia - discharged on 12/22 with IV Levaquin and Ciprofloxacin antibiotic lock solution, antibiotic course completed on 11/27/21.  PICC line change most recently on 12/15 with IR.  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  Pharmacy has been consulted to resume TPN.  Glucose / Insulin: No hx DM. BG < 100. Goal BG <150 -Patient has not required insulin during past admissions Electrolytes: CorrCa (10.4) slightly elevated; all other lytes WNL Renal: Scr & BUN both elevated but trending down (baseline SCr ~1.4) Hepatic: LFTs elevated but trending down - AST 110, ALT 307, Alk phos 386. T.bili WNL Intake / Output; MIVF: LR at 125/hr -Strict I/O not measured. UOP x2, stool x1 GI Imaging:  - 1/2 CT A/P: distal colonic wall thickening consistent with inflammatory or infectious colitis, no evidence of bowel obstruction or ileus GI Surgeries / Procedures: None thus far this admission  Central access: PICC  TPN start date: Continued from PTA - will resume 11/28/21  RD Assessment:  Pending  Current Nutrition:  NPO and TPN to start 1/3  Most Recent Home TPN (per [facility] Rx Note on 12/22):  - Cycle 1800 mL over 12 hours - Protein 85g, CHO 220g - 20% SMOF 60g 3x week on MWF   Plan:  Called [facility] [phone]) to request copy of most recent TPN formula. Awaiting fax  BCID + yeast. Will hold TPN at this time for fungemia. Discussed with MD. ID will be auto-consulted.  Pharmacy to sign off, please  re-consult once blood cultures have cleared and deemed safe to resume TPN.   Lenis Noon, PharmD 11/28/21 9:37 AM

## 2021-11-28 NOTE — Progress Notes (Addendum)
PROGRESS NOTE    Caitlin Peters  YQI:[id number] DOB: 1961/04/23 DOA: 11/27/2021 PCP: [name], [name], MD   Brief Narrative: Hpi per dr Arlester Marker is a 61 y.o. female with medical history significant of crohn's disease s/p multiple resection c/b short gut syndrome and severe malnutrition on chronic TPN since 2003, poor peripheral access with right thigh picc line, stage IV CKD, anxiety and depression, seizure disorder, hypertension, recurrent CLABSI who presented with abdominal discomfort, vomiting, fever which is concerning to her for a crohn's flare.   Of note, she was recently hospitalized with fever and abdominal pain, found to have stenotrophomonas maltophilia and pseudomonas putida CLABSI.  She was discharged on levaqin and cipro intracatheter lock with plans to continue until 11/27/2021 (today).  She began to develop abdominal pain, nausea, vomiting (clear emesis) for the past 3 days.  She notes this feels similar to crohn's flares.  Noted fever to 100.7, chills, and headache.  Also noticed decreased appetite.  Denies CP or SOB.  Denies smoking, etoh.  Her primary gastroenterologist is at [facility], Dr. Domenica Fail.  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 Domenica Fail.  Patient has had multiple small bowel resections long-term TPN multiple line infections.  She was recently hospitalized for line infection and now she is  admitted with nausea vomiting abdominal pain and diarrhea. Appreciate GI input.  Planning for colonoscopy in AM. Holding off on steroids at this time. 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 CT abdomen shows distal colonic wall thickening consistent with mild inflammatory changes.  No obstruction. C. difficile negative GI panel pending  #2 fungemia positive blood culture with yeast 11/27/2021 Positive blood culture from 11/11/2021 with stenotrophomonas multiple failure and Pseudomonas.  ID consulted. Started Eraxis. Individual radiology consulted to remove the line.  #3 AKI continue IV fluids her baseline creatinine is around 1.5. Creatinine on admission 2.29 down to 1.91  #4 right groin PICC line-patient gets TPN through that line which we are going to hold since blood cultures have been positive.  Await ID input.  #5 chronic pain syndrome on methadone and Dilaudid p.o. at home.  #6 hypertension on Coreg hydralazine and amlodipine  #7 pancreatic insufficiency on Creon  #8 abnormal LFTs patient had liver biopsy minimal to mild portal inflammation no fibrotic change. portal inflammation and 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 differential diagnosis drug-induced injury, probably pharmacotherapy, chronic hepatitis B, type 2 diabetes, TPN changes.  #9 depression anxiety on Wellbutrin and Cymbalta  #10 history of seizures on Keppra 500 twice daily  #11 pancytopenia-possibly secondary to multifactorial etiology as above  monitor closely on Lovenox platelets trending down   Nutrition Problem: Moderate Malnutrition Etiology: chronic illness (Crohn's with SBS on chronic home TPN)  Signs/Symptoms: mild fat depletion, mild muscle depletion, moderate muscle depletion  Interventions: Boost Breeze, MVI  Estimated body mass index is 20.53 kg/m  as calculated from the following:   Height as of this encounter: 5' 8"  (1.727 m).   Weight as of this  encounter: 61.2 kg.  DVT prophylaxis: lovenox Code Status: Full Family Communication: None at bedside Disposition Plan:  Status is: Inpatient  Remains inpatient appropriate because: Patient on IV fluids n.p.o. for colonoscopy  Consultants:  GI and infectious disease  Procedures: None Antimicrobials: Eraxis  Subjective: Patient is resting in bed she feels better than yesterday she has not had any nausea vomiting or diarrhea today She reports she lost 20 pounds in the past few weeks to month Remains on IV fluids as she is n.p.o. for colonoscopy and AKI  Objective: Vitals:   11/27/21 2207 11/28/21 0137 11/28/21 0542 11/28/21 0903  BP: (!) 182/76 130/60 (!) 141/60 (!) 155/73  Pulse: 62 74 70 79  Resp: 16 18 18 18   Temp: 97.6 F (36.4 C) 98 F (36.7 C) 97.7 F (36.5 C) 98.1 F (36.7 C)  TempSrc: Oral Oral Oral Oral  SpO2: 100% 98% 97% 100%  Weight:      Height:        Intake/Output Summary (Last 24 hours) at 11/28/2021 1248 Last data filed at 11/28/2021 0900 Gross per 24 hour  Intake 1226.6 ml  Output --  Net 1226.6 ml   Filed Weights   11/27/21 1225  Weight: 61.2 kg    Examination:  General exam: Appears frail chronically ill looking Respiratory system: Clear to auscultation. Respiratory effort normal. Cardiovascular system: S1 & S2 heard, RRR. No JVD, murmurs, rubs, gallops or clicks. No pedal edema. Gastrointestinal system: Abdomen is nondistended, soft and nontender. No organomegaly or masses felt. Normal bowel sounds heard. Central nervous system: Alert and oriented. No focal neurological deficits. Extremities: She has a  line in the right lower extremity right groin no erythema edema Skin: No rashes, lesions or ulcers Psychiatry: Judgement and insight appear normal. Mood & affect appropriate.     Data Reviewed: I have personally reviewed following labs and imaging studies  CBC: Recent Labs  Lab 11/27/21 1220 11/27/21 2126 11/28/21 0338  WBC 2.9* 2.5*  2.3*  HGB 8.0* 8.6* 7.5*  HCT 24.0* 25.9* 22.7*  MCV 93.8 91.8 93.8  PLT 147* 158 502*   Basic Metabolic Panel: Recent Labs  Lab 11/27/21 1220 11/27/21 2126 11/28/21 0338  NA 134*  --  136  K 4.7  --  4.4  CL 104  --  102  CO2 25  --  24  GLUCOSE 87  --  70  BUN 46*  --  37*  CREATININE 2.29* 2.17* 1.91*  CALCIUM 9.2  --  9.2  MG  --   --  2.1  PHOS  --   --  3.9   GFR: Estimated Creatinine Clearance: 30.3 mL/min (A) (by C-G formula based on SCr of 1.91 mg/dL (H)). Liver Function Tests: Recent Labs  Lab 11/27/21 1220 11/28/21 0338  AST 226* 110*  ALT 450* 307*  ALKPHOS 469* 386*  BILITOT 1.0 1.0  PROT 6.5 5.9*  ALBUMIN 2.8* 2.5*   Recent Labs  Lab 11/27/21 1220  LIPASE 24   No results for input(s): AMMONIA in the last 168 hours. Coagulation Profile: No results for input(s): INR, PROTIME in the last 168 hours. Cardiac Enzymes: No results for input(s): CKTOTAL, CKMB, CKMBINDEX, TROPONINI in the last 168 hours. BNP (last 3 results) No results for input(s): PROBNP in the last 8760 hours. HbA1C: No results for input(s): HGBA1C in the last 72  hours. CBG: No results for input(s): GLUCAP in the last 168 hours. Lipid Profile: Recent Labs    11/28/21 0338  TRIG 126   Thyroid Function Tests: No results for input(s): TSH, T4TOTAL, FREET4, T3FREE, THYROIDAB in the last 72 hours. Anemia Panel: No results for input(s): VITAMINB12, FOLATE, FERRITIN, TIBC, IRON, RETICCTPCT in the last 72 hours. Sepsis Labs: No results for input(s): PROCALCITON, LATICACIDVEN in the last 168 hours.  Recent Results (from the past 240 hour(s))  Blood culture (routine x 2)     Status: Abnormal (Preliminary result)   Collection Time: 11/27/21 12:20 PM   Specimen: BLOOD  Result Value Ref Range Status   Specimen Description   Final    BLOOD RIGHT ANTECUBITAL Performed at [facility] [address], [place], [place] [address]    Special Requests   Final     BOTTLES DRAWN AEROBIC AND ANAEROBIC Blood Culture adequate volume Performed at [facility] [address], [place], [place] [address]    Culture  Setup Time (A)  Final    YEAST AEROBIC BOTTLE ONLY CRITICAL RESULT CALLED TO, READ BACK BY AND VERIFIED WITH: PHARM D M.Berline Lopes ON [id number] BY E.PARRISH Performed at [facility], [place] [address], [place], [place] [address]    Culture YEAST  Final   Report Status PENDING  Incomplete  Blood Culture ID Panel (Reflexed)     Status: None   Collection Time: 11/27/21 12:20 PM  Result Value Ref Range Status   Enterococcus faecalis NOT DETECTED NOT DETECTED Final   Enterococcus Faecium NOT DETECTED NOT DETECTED Final   Listeria monocytogenes NOT DETECTED NOT DETECTED Final   Staphylococcus species NOT DETECTED NOT DETECTED Final   Staphylococcus aureus (BCID) NOT DETECTED NOT DETECTED Final   Staphylococcus epidermidis NOT DETECTED NOT DETECTED Final   Staphylococcus lugdunensis NOT DETECTED NOT DETECTED Final   Streptococcus species NOT DETECTED NOT DETECTED Final   Streptococcus agalactiae NOT DETECTED NOT DETECTED Final   Streptococcus pneumoniae NOT DETECTED NOT DETECTED Final   Streptococcus pyogenes NOT DETECTED NOT DETECTED Final   A.calcoaceticus-baumannii NOT DETECTED NOT DETECTED Final   Bacteroides fragilis NOT DETECTED NOT DETECTED Final   Enterobacterales NOT DETECTED NOT DETECTED Final   Enterobacter cloacae complex NOT DETECTED NOT DETECTED Final   Escherichia coli NOT DETECTED NOT DETECTED Final   Klebsiella aerogenes NOT DETECTED NOT DETECTED Final   Klebsiella oxytoca NOT DETECTED NOT DETECTED Final   Klebsiella pneumoniae NOT DETECTED NOT DETECTED Final   Proteus species NOT DETECTED NOT DETECTED Final   Salmonella species NOT DETECTED NOT DETECTED Final   Serratia marcescens NOT DETECTED NOT DETECTED Final   Haemophilus influenzae NOT DETECTED NOT DETECTED Final   Neisseria meningitidis NOT DETECTED  NOT DETECTED Final   Pseudomonas aeruginosa NOT DETECTED NOT DETECTED Final   Stenotrophomonas maltophilia NOT DETECTED NOT DETECTED Final   Candida albicans NOT DETECTED NOT DETECTED Final   Candida auris NOT DETECTED NOT DETECTED Final   Candida glabrata NOT DETECTED NOT DETECTED Final   Candida krusei NOT DETECTED NOT DETECTED Final   Candida parapsilosis NOT DETECTED NOT DETECTED Final   Candida tropicalis NOT DETECTED NOT DETECTED Final   Cryptococcus neoformans/gattii NOT DETECTED NOT DETECTED Final    Comment: Performed at [facility], [address] [place], [place] [address]  C Difficile Quick Screen w PCR reflex     Status: None   Collection Time: 11/28/21  1:30 AM   Specimen: STOOL  Result Value Ref Range Status   C Diff antigen NEGATIVE NEGATIVE Final   C Diff toxin NEGATIVE NEGATIVE Final   C Diff interpretation No C. difficile detected.  Final    Comment: Performed at [facility], [place] [address] [place], [place] [address]         Radiology Studies: CT ABDOMEN PELVIS WO CONTRAST  Result Date: 11/27/2021 CLINICAL DATA:  Generalized abdominal pain, history of Crohn disease, recent liver biopsy EXAM: CT ABDOMEN AND PELVIS WITHOUT CONTRAST TECHNIQUE: Multidetector CT imaging of the abdomen and pelvis was performed following the standard protocol without IV contrast. Unenhanced CT was performed per clinician order. Lack of IV contrast limits sensitivity and specificity, especially for evaluation of abdominal/pelvic solid viscera. COMPARISON:  11/11/2021, 11/11/2020 FINDINGS: Lower chest: No acute pleural or parenchymal lung disease. Hepatobiliary: Biliary dilation and pneumobilia are again identified, with high attenuation material within the intrahepatic and extrahepatic bile ducts likely refluxed oral contrast suggesting prior sphincterotomy or incompetent sphincter. Otherwise unremarkable unenhanced appearance of the liver. Gallbladder is absent.  Pancreas: Unremarkable unenhanced appearance. Spleen: Unremarkable unenhanced appearance. Adrenals/Urinary Tract: No urinary tract calculi or obstructive uropathy. The adrenals are difficult to appreciate on this unenhanced exam. Bladder is unremarkable. Stomach/Bowel: Evaluation is limited without IV contrast. Oral contrast has traversed into the colon by the time of imaging. Multiple enterotomy staple lines are again seen within the mid abdomen. There is wall thickening of the distal colon, extending from the splenic flexure through the rectosigmoid junction. Scattered colonic gas fluid levels are noted. Vascular/Lymphatic: Right femoral PICC again identified, tip at the level of the hepatic veins. No other significant vascular finding on this limited unenhanced exam. No discrete adenopathy. Reproductive: Status post hysterectomy. No adnexal masses. Other: No free fluid or free intraperitoneal gas. No abdominal wall hernia. Musculoskeletal: No acute or destructive bony lesions. Reconstructed images demonstrate no additional findings. IMPRESSION: 1. Distal colonic wall thickening consistent with inflammatory or infectious colitis. 2. No evidence of bowel obstruction or ileus. Scattered distal colonic gas fluid levels may reflect diarrhea or recent laxative/enema use. 3. Stable biliary duct dilation, with increased pneumobilia and reflux of oral contrast into the biliary tree. This likely reflects previous sphincterotomy. 4. Right femoral PICC, tip at level of the hepatic veins. Electronically Signed   By: Randa Ngo M.D.   On: 11/27/2021 15:58   DG Chest Port 1 View  Result Date: 11/27/2021 CLINICAL DATA:  Sixty fever EXAM: PORTABLE CHEST 1 VIEW COMPARISON:  October 07, 2021 FINDINGS: The heart size and mediastinal contours are stable. Both lungs are clear. The visualized skeletal structures are unremarkable. IMPRESSION: No active disease. Electronically Signed   By: Abelardo Diesel M.D.   On: 11/27/2021  14:14        Scheduled Meds:  amLODipine  10 mg Oral Daily   buPROPion ER  200 mg Oral Daily   calcium carbonate  1,250 mg Oral Q breakfast   carvedilol  12.5 mg Oral BID WC   Chlorhexidine Gluconate Cloth  6 each Topical Daily   cholecalciferol  1,000 Units Oral Daily   cycloSPORINE  1 drop Both Eyes BID   DULoxetine  120 mg Oral Daily   estradiol  2 mg Oral Daily   feeding supplement  1 Container Oral TID BM   heparin injection (subcutaneous)  5,000 Units Subcutaneous Q8H   hydrALAZINE  100 mg Oral TID   HYDROmorphone  4 mg Oral QID   levETIRAcetam  500 mg Oral BID  lipase/protease/amylase  36,000 Units Oral TID with meals   methadone  5 mg Oral 5 X Daily   methocarbamol  500 mg Oral BID   multivitamin with minerals   Oral Daily   pantoprazole  40 mg Oral Daily   pantoprazole (PROTONIX) IV  40 mg Intravenous Q24H   sodium chloride flush  10-40 mL Intracatheter Q12H   sucralfate  1 g Oral BID AC   Teduglutide (rDNA)  1.5 mg Subcutaneous Daily   vitamin B-12  1,000 mcg Oral Daily   Continuous Infusions:  sodium chloride 125 mL/hr at 11/28/21 1055   anidulafungin 200 mg (11/28/21 1042)   Followed by   Derrill Memo ON 11/29/2021] anidulafungin       LOS: 1 day    Time spent: 38 min  Georgette Shell, MD 11/28/2021, 12:48 PM

## 2021-11-28 NOTE — Progress Notes (Signed)
Nutrition Follow-up  DOCUMENTATION CODES:   Non-severe (moderate) malnutrition in context of chronic illness  INTERVENTION:  - will order Boost Breeze TID, each supplement provides 250 kcal and 9 grams of protein. - diet advancement as medically feasible. - TPN resumption when feasible. - weigh today.    NUTRITION DIAGNOSIS:   Moderate Malnutrition related to chronic illness (Crohn's with SBS on chronic home TPN) as evidenced by mild fat depletion, mild muscle depletion, moderate muscle depletion.  GOAL:   Patient will meet greater than or equal to 90% of their needs  MONITOR:   PO intake, Supplement acceptance, Diet advancement, Labs, Weight trends, Other (Comment) (ability to resume TPN)  REASON FOR ASSESSMENT:   Malnutrition Screening Tool, Consult New TPN/TNA  ASSESSMENT:   61 y.o. female with medical history of Crohn's disease s/p multiple resection c/b short gut syndrome and severe malnutrition on chronic TPN since 2003, poor peripheral access with R femoral PICC line, stage 4 CKD, anxiety, depression, seizure disorder, HTN, and recurrent CLABSI. She presented to the ED with abdominal discomfort, vomiting, and fever.  Patient is very well known to this RD from previous hospitalizations. She is currently on TPN holiday d/t fungemia. PICC last replaced at [facility] on 11/09/21.  She is on CLD and reports that she has had some juice this AM and asked MD about being changed to Soft diet. Patient requests Boost Breeze as she enjoys this supplement. The last solid food she had was on 12/31 or 1/1 when she had black eyed peas.   She has some nausea and abdominal discomfort this AM but it is improved from yesterday.   Patient shares that she was not weighed in the ED and no one asked her how much she weighed. She feels she is 120-125 lb currently. She is concerned because she has had several TPN holidays in recent weeks and has been losing weight d/t his.   She is on Gattex  at home and has brought it to the hospital with her; self administers.    Labs reviewed; BUN: 37 mg/dl, creatinine: 1.91 mg/dl, Alk Phos elevated and LFTs elevated, GFR: 30 ml/min.  Medications reviewed; 1250 mg os-cal/day, 1000 units cholecalciferol/day, 36000 units creon TID, 40 mg oral protonix/day, 1 tablet multivitamin with minerals/day, 1 g carafate BID, 1000 mcg oral cyanocobalamin/day.   IVF; NS @ 125 ml/hr.    NUTRITION - FOCUSED PHYSICAL EXAM:  Flowsheet Row Most Recent Value  Orbital Region Mild depletion  Upper Arm Region Moderate depletion  Thoracic and Lumbar Region Unable to assess  Buccal Region Mild depletion  Temple Region Mild depletion  Clavicle Bone Region Mild depletion  Clavicle and Acromion Bone Region Moderate depletion  Scapular Bone Region Mild depletion  Dorsal Hand No depletion  Patellar Region Moderate depletion  Anterior Thigh Region Moderate depletion  Posterior Calf Region Moderate depletion  Edema (RD Assessment) None  Hair Reviewed  Eyes Reviewed  Mouth Reviewed  Skin Reviewed  Nails Reviewed       Diet Order:   Diet Order             Diet clear liquid Room service appropriate? Yes; Fluid consistency: Thin  Diet effective now                   EDUCATION NEEDS:   Education needs have been addressed  Skin:  Skin Assessment: Skin Integrity Issues: Skin Integrity Issues:: Incisions Incisions: R abdomen (12/20)  Last BM:  1/3 (type 5)  Height:  Ht Readings from Last 1 Encounters:  11/27/21 _0  (1.727 m)    Weight:   Wt Readings from Last 1 Encounters:  11/27/21 61.2 kg     Estimated Nutritional Needs:  Kcal:  2000-2200 kcal Protein:  100-115 grams Fluid:  >/= 2 L/day     Jarome Matin, MS, RD, LDN Inpatient Clinical Dietitian RD pager # available in [place]  After hours/weekend pager # available in [facility]

## 2021-11-29 ENCOUNTER — Inpatient Hospital Stay (HOSPITAL_COMMUNITY): Payer: Medicare Other

## 2021-11-29 ENCOUNTER — Encounter (HOSPITAL_COMMUNITY): Payer: Self-pay | Admitting: Family Medicine

## 2021-11-29 ENCOUNTER — Encounter (HOSPITAL_COMMUNITY)
Admission: EM | Disposition: A | Discharge status: Home Health | Payer: Self-pay | Source: Home / Self Care | Attending: Family Medicine

## 2021-11-29 ENCOUNTER — Inpatient Hospital Stay (HOSPITAL_COMMUNITY): Payer: Medicare Other | Admitting: Certified Registered Nurse Anesthetist

## 2021-11-29 DIAGNOSIS — G894 Chronic pain syndrome: Secondary | ICD-10-CM

## 2021-11-29 DIAGNOSIS — R7881 Bacteremia: Secondary | ICD-10-CM

## 2021-11-29 DIAGNOSIS — K5010 Crohn's disease of large intestine without complications: Secondary | ICD-10-CM | POA: Diagnosis not present

## 2021-11-29 DIAGNOSIS — N179 Acute kidney failure, unspecified: Secondary | ICD-10-CM

## 2021-11-29 DIAGNOSIS — R7989 Other specified abnormal findings of blood chemistry: Secondary | ICD-10-CM

## 2021-11-29 DIAGNOSIS — K8689 Other specified diseases of pancreas: Secondary | ICD-10-CM

## 2021-11-29 DIAGNOSIS — Q438 Other specified congenital malformations of intestine: Secondary | ICD-10-CM

## 2021-11-29 DIAGNOSIS — B377 Candidal sepsis: Secondary | ICD-10-CM

## 2021-11-29 DIAGNOSIS — K50819 Crohn's disease of both small and large intestine with unspecified complications: Secondary | ICD-10-CM

## 2021-11-29 DIAGNOSIS — K50119 Crohn's disease of large intestine with unspecified complications: Secondary | ICD-10-CM

## 2021-11-29 HISTORY — PX: IR REMOVAL TUN CV CATH W/O FL: IMG2289

## 2021-11-29 HISTORY — PX: COLONOSCOPY WITH PROPOFOL: SHX5780

## 2021-11-29 LAB — ECHOCARDIOGRAM COMPLETE
AR max vel: 3.62 cm2
AV Area VTI: 3.55 cm2
AV Area mean vel: 3.41 cm2
AV Mean grad: 8 mmHg
AV Peak grad: 13 mmHg
Ao pk vel: 1.8 m/s
Area-P 1/2: 2.93 cm2
Calc EF: 74.3 %
Height: 68 in
S' Lateral: 2 cm
Single Plane A2C EF: 74.2 %
Single Plane A4C EF: 75 %
Weight: 2160 oz

## 2021-11-29 SURGERY — COLONOSCOPY WITH PROPOFOL
Anesthesia: Monitor Anesthesia Care

## 2021-11-29 MED ORDER — FLUCONAZOLE IN SODIUM CHLORIDE 400-0.9 MG/200ML-% IV SOLN
400.0000 mg | Freq: Once | INTRAVENOUS | Status: AC
  Administered 2021-11-30: 400 mg via INTRAVENOUS
  Filled 2021-11-29 (×2): qty 200

## 2021-11-29 MED ORDER — PROPOFOL 10 MG/ML IV BOLUS
INTRAVENOUS | Status: DC | PRN
  Administered 2021-11-29: 20 mg via INTRAVENOUS
  Administered 2021-11-29: 10 mg via INTRAVENOUS
  Administered 2021-11-29: 20 mg via INTRAVENOUS
  Administered 2021-11-29: 10 mg via INTRAVENOUS

## 2021-11-29 MED ORDER — LIDOCAINE HCL 1 % IJ SOLN
INTRAMUSCULAR | Status: AC
  Filled 2021-11-29: qty 20

## 2021-11-29 MED ORDER — ONDANSETRON HCL 4 MG/2ML IJ SOLN
INTRAMUSCULAR | Status: DC | PRN
  Administered 2021-11-29: 4 mg via INTRAVENOUS

## 2021-11-29 MED ORDER — LACTATED RINGERS IV SOLN: INTRAVENOUS | Status: DC

## 2021-11-29 MED ORDER — FLUCONAZOLE IN SODIUM CHLORIDE 200-0.9 MG/100ML-% IV SOLN
200.0000 mg | INTRAVENOUS | Status: DC
  Administered 2021-12-01 – 2021-12-06 (×6): 200 mg via INTRAVENOUS
  Filled 2021-11-29 (×7): qty 100

## 2021-11-29 MED ORDER — PROPOFOL 500 MG/50ML IV EMUL
INTRAVENOUS | Status: AC
  Filled 2021-11-29: qty 50

## 2021-11-29 MED ORDER — PROPOFOL 500 MG/50ML IV EMUL
INTRAVENOUS | Status: DC | PRN
  Administered 2021-11-29: 100 ug/kg/min via INTRAVENOUS

## 2021-11-29 SURGICAL SUPPLY — 22 items

## 2021-11-29 NOTE — Progress Notes (Signed)
PROGRESS NOTE    Caitlin Peters  HTM:[id number] DOB: 07/25/1961 DOA: 11/27/2021 PCP: [name], [name], MD   Brief Narrative: Caitlin Peters is a 61 y.o. 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 Patient presented secondary to abdominal pain, vomiting and fever with concern for Chron flare but found to have associated Candidemia. Fluconazole IV initiated. TPN held with plan for removal of PICC.   Assessment & Plan:   * 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[place]) Possible Crohn flare. GI consulted. GI pathogen panel and C. Difficile ordered but no sample to date. GI planning colonoscopy 1/4. Previously on budesonide but discontinued in 2022. 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 In setting of tunneled IV for chronic TPN. Initial blood cultures significant for Candida Lusitaniae. Infectious disease on board and patient started on fluconazole 400 mg x 1 dose tapering to fluconazole 200 mg daily. Transthoracic Echocardiogram (1/4) obtained and negative for evidence of valvular vegetations. Repeat blood culture (1/4) pending. -Infectious disease recommendations: pending today; continue fluconazole, follow-up blood cultures, remove tunneled venous access  PICC (peripherally inserted central catheter) in place Limited IV access. Currently with right femoral vein PICC line which needs to be removed secondary to candidemia. IR informed.  Sepsis ([place])- (present on admission) Present on admission. Secondary to Candidemia.  Anemia- (present on admission) Chronic issue. Hemoglobin drifting down. No overt bleeding noted. Previous iron panel in 6/22 was normal, although ferritin was on the low end of normal.  Associated normocytosis. Patient possibly with hemoconcentration on admission -Transfuse for hemoglobin <7  LFTs abnormal- (present on admission) Chronic. 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  AKI (acute kidney injury) ([place])- (present on admission) Creatinine of 2.29 no admission. Likely secondary to decreased oral intake and diarrhea with suspected Crohn flare. No hydronephrosis on CT imaging. Improving with IV fluids. -Continue IV fluids -BMP in AM   [name] -Continue Keppra  Abdominal pain- (present on admission) Secondary to colitis  Chronic pain syndrome- (present on admission) -Continue home meds (methadone, dilaudid PO) with prn IV breakthrough  Depression- (present on admission) -Continue Wellbutrin and Cymbalta  Crohn's disease ([place]) Hx crohn's with prior resection  Hypertension- (present on admission) -Continue Amlodipine, Coreg and hydralazine  Bacteremia associated with intravascular line ([name])-resolved as of 11/29/2021, (present on admission) Patient treated on previous admission and completed antibiotic course on 11/27/21. At that time, blood culture positive for stenotrophomonas maltophilia and pseudomonas putida. Repeat blood culture this admission shows treatment of bacteremia.  Pancreatic insufficiency -Continue Creon  Vitamin B12 deficiency- (present on admission) -Continue B12 supplementation  Short gut syndrome- (present on admission) -Continue teduglutide    DVT prophylaxis: Heparin subq Code Status:   Code Status: Full Code Family Communication: None at bedside Disposition Plan: Discharge likely in 5+ days pending cleared blood cultures, replacement of PICC for TPN   Consultants:  [facility] Infectious disease Interventional radiology  Procedures:  TRANSTHORACIC ECHOCARDIOGRAM  (11/29/2021) IMPRESSIONS     1. Left ventricular ejection fraction, by estimation, is 65 to 70%. Left  ventricular ejection fraction by 3D volume is 67 %. The left ventricle has  normal function. The left ventricle has no regional wall motion  abnormalities. There  is moderate left  ventricular hypertrophy. Left ventricular diastolic parameters are  consistent with Grade I diastolic dysfunction (impaired relaxation). The  average left ventricular global longitudinal strain is -24.9 %. The global  longitudinal strain is normal.   2. Right ventricular systolic function is normal. The right ventricular  size is normal. There is normal pulmonary artery systolic pressure.   3. Left atrial size was severely dilated.   4. The mitral valve is grossly normal. Mild mitral valve regurgitation.   5. The aortic valve is tricuspid. There is mild calcification of the  aortic valve. There is mild thickening of the aortic valve. Aortic valve  regurgitation is not visualized. Aortic valve sclerosis is present, with  no evidence of aortic valve stenosis.   6. The inferior vena cava is dilated in size with >50% respiratory  variability, suggesting right atrial pressure of 8 mmHg.   Antimicrobials: Fluconazole IV    Subjective: No issues overnight. Hoping to eat soon.   Objective: Vitals:   11/28/21 1355 11/28/21 2114 11/29/21 0528 11/29/21 0823  BP: 127/66 131/74 (!) 172/69 (!) 157/70  Pulse: 71 65 63 68  Resp: 20 16 15    Temp: 98 F (36.7 C) 97.9 F (36.6 C) 98.6 F (37 C)   TempSrc: Oral Oral    SpO2: 97% 97% 97%   Weight:      Height:        Intake/Output Summary (Last 24 hours) at 11/29/2021 1229 Last data filed at 11/29/2021 1056 Gross per 24 hour  Intake 3118.3 ml  Output 800 ml  Net 2318.3 ml   Filed Weights   11/27/21 1225  Weight: 61.2 kg    Examination:  General exam: Appears calm and comfortable Respiratory system: Clear to auscultation. Respiratory effort  normal. Cardiovascular system: S1 & S2 heard, RRR. 2/6 systolic murmur Gastrointestinal system: Abdomen is nondistended, soft and nontender. No organomegaly or masses felt. Normal bowel sounds heard. Central nervous system: Alert and oriented. No focal neurological deficits. Musculoskeletal: No edema. No calf tenderness Skin: No cyanosis. No rashes Psychiatry: Judgement and insight appear normal. Mood & affect appropriate.     Data Reviewed: I have personally reviewed following labs and imaging studies  CBC Lab Results  Component Value Date   WBC 2.3 (L) 11/28/2021   RBC 2.42 (L) 11/28/2021   HGB 7.5 (L) 11/28/2021   HCT 22.7 (L) 11/28/2021   MCV 93.8 11/28/2021   MCH 31.0 11/28/2021   PLT 138 (L) 11/28/2021   MCHC 33.0 11/28/2021   RDW 13.4 11/28/2021   LYMPHSABS 0.3 (L) 10/08/2021   MONOABS 0.2 10/08/2021   EOSABS 0.0 10/08/2021   BASOSABS 0.0 26/83/4196     Last metabolic panel Lab Results  Component Value Date   NA 136 11/28/2021   K 4.4 11/28/2021   CL 102 11/28/2021   CO2 24 11/28/2021   BUN 37 (H) 11/28/2021   CREATININE 1.91 (H) 11/28/2021   GLUCOSE 70 11/28/2021   GFRNONAA 30 (L) 11/28/2021   GFRAA 55 (L) 08/12/2020   CALCIUM 9.2 11/28/2021   PHOS 3.9 11/28/2021   PROT 5.9 (L) 11/28/2021   ALBUMIN 2.5 (L) 11/28/2021   BILITOT 1.0 11/28/2021   ALKPHOS 386 (H) 11/28/2021   AST 110 (H) 11/28/2021   ALT 307 (H) 11/28/2021   ANIONGAP 10 11/28/2021    CBG (last 3)  No results for input(s): GLUCAP in the last 72 hours.   GFR: Estimated Creatinine Clearance: 30.3 mL/min (A) (by C-G formula based on  SCr of 1.91 mg/dL (H)).  Coagulation Profile: No results for input(s): INR, PROTIME in the last 168 hours.  Recent Results (from the past 240 hour(s))  Blood culture (routine x 2)     Status: Abnormal (Preliminary result)   Collection Time: 11/27/21 12:20 PM   Specimen: BLOOD  Result Value Ref Range Status   Specimen Description   Final    BLOOD RIGHT  ANTECUBITAL Performed at [facility] [address], [place], [place] [address]    Special Requests   Final    BOTTLES DRAWN AEROBIC AND ANAEROBIC Blood Culture adequate volume Performed at [facility] [address] [place], [place] [address]    Culture  Setup Time (A)  Final    YEAST AEROBIC BOTTLE ONLY CRITICAL RESULT CALLED TO, READ BACK BY AND VERIFIED WITH: PHARM D M.TUCKER ON [id number] BY E.PARRISH    Culture (A)  Final    CANDIDA LUSITANIAE CULTURE REINCUBATED FOR BETTER GROWTH Performed at [facility], [place] [address], [place], [place] [address]    Report Status PENDING  Incomplete  Blood Culture ID Panel (Reflexed)     Status: None   Collection Time: 11/27/21 12:20 PM  Result Value Ref Range Status   Enterococcus faecalis NOT DETECTED NOT DETECTED Final   Enterococcus Faecium NOT DETECTED NOT DETECTED Final   Listeria monocytogenes NOT DETECTED NOT DETECTED Final   Staphylococcus species NOT DETECTED NOT DETECTED Final   Staphylococcus aureus (BCID) NOT DETECTED NOT DETECTED Final   Staphylococcus epidermidis NOT DETECTED NOT DETECTED Final   Staphylococcus lugdunensis NOT DETECTED NOT DETECTED Final   Streptococcus species NOT DETECTED NOT DETECTED Final   Streptococcus agalactiae NOT DETECTED NOT DETECTED Final   Streptococcus pneumoniae NOT DETECTED NOT DETECTED Final   Streptococcus pyogenes NOT DETECTED NOT DETECTED Final   A.calcoaceticus-baumannii NOT DETECTED NOT DETECTED Final   Bacteroides fragilis NOT DETECTED NOT DETECTED Final   Enterobacterales NOT DETECTED NOT DETECTED Final   Enterobacter cloacae complex NOT DETECTED NOT DETECTED Final   Escherichia coli NOT DETECTED NOT DETECTED Final   Klebsiella aerogenes NOT DETECTED NOT DETECTED Final   Klebsiella oxytoca NOT DETECTED NOT DETECTED Final   Klebsiella pneumoniae NOT DETECTED NOT DETECTED Final   Proteus species NOT DETECTED NOT DETECTED Final    Salmonella species NOT DETECTED NOT DETECTED Final   Serratia marcescens NOT DETECTED NOT DETECTED Final   Haemophilus influenzae NOT DETECTED NOT DETECTED Final   Neisseria meningitidis NOT DETECTED NOT DETECTED Final   Pseudomonas aeruginosa NOT DETECTED NOT DETECTED Final   Stenotrophomonas maltophilia NOT DETECTED NOT DETECTED Final   Candida albicans NOT DETECTED NOT DETECTED Final   Candida auris NOT DETECTED NOT DETECTED Final   Candida glabrata NOT DETECTED NOT DETECTED Final   Candida krusei NOT DETECTED NOT DETECTED Final   Candida parapsilosis NOT DETECTED NOT DETECTED Final   Candida tropicalis NOT DETECTED NOT DETECTED Final   Cryptococcus neoformans/gattii NOT DETECTED NOT DETECTED Final    Comment: Performed at [facility], [address], [place], [place] [address]  Blood culture (routine x 2)     Status: None (Preliminary result)   Collection Time: 11/27/21  9:26 PM   Specimen: BLOOD  Result Value Ref Range Status   Specimen Description   Final    BLOOD BLOOD LEFT HAND Performed at [facility] [address], [place],  [address]    Special Requests   Final  BOTTLES DRAWN AEROBIC ONLY Blood Culture adequate volume Performed at [facility] [address] [place], [place] [address]    Culture  Setup Time   Final    YEAST AEROBIC BOTTLE ONLY CRITICAL VALUE NOTED.  VALUE IS CONSISTENT WITH PREVIOUSLY REPORTED AND CALLED VALUE.    Culture   Final    NO GROWTH 1 DAY Performed at [facility], [place] [address], [place], [place] [address]    Report Status PENDING  Incomplete  Gastrointestinal Panel by PCR , Stool     Status: None   Collection Time: 11/28/21  1:30 AM   Specimen: STOOL  Result Value Ref Range Status   Campylobacter species NOT DETECTED NOT DETECTED Final   Plesimonas shigelloides NOT DETECTED NOT DETECTED Final   Salmonella species NOT DETECTED NOT DETECTED Final   Yersinia enterocolitica  NOT DETECTED NOT DETECTED Final   Vibrio species NOT DETECTED NOT DETECTED Final   Vibrio cholerae NOT DETECTED NOT DETECTED Final   Enteroaggregative E coli (EAEC) NOT DETECTED NOT DETECTED Final   Enteropathogenic E coli (EPEC) NOT DETECTED NOT DETECTED Final   Enterotoxigenic E coli (ETEC) NOT DETECTED NOT DETECTED Final   Shiga like toxin producing E coli (STEC) NOT DETECTED NOT DETECTED Final   Shigella/Enteroinvasive E coli (EIEC) NOT DETECTED NOT DETECTED Final   Cryptosporidium NOT DETECTED NOT DETECTED Final   Cyclospora cayetanensis NOT DETECTED NOT DETECTED Final   Entamoeba histolytica NOT DETECTED NOT DETECTED Final   Giardia lamblia NOT DETECTED NOT DETECTED Final   Adenovirus F40/41 NOT DETECTED NOT DETECTED Final   Astrovirus NOT DETECTED NOT DETECTED Final   Norovirus GI/GII NOT DETECTED NOT DETECTED Final   Rotavirus A NOT DETECTED NOT DETECTED Final   Sapovirus (I, II, IV, and V) NOT DETECTED NOT DETECTED Final    Comment: Performed at [facility], [address] [place], [place] [address]  C Difficile Quick Screen w PCR reflex     Status: None   Collection Time: 11/28/21  1:30 AM   Specimen: STOOL  Result Value Ref Range Status   C Diff antigen NEGATIVE NEGATIVE Final   C Diff toxin NEGATIVE NEGATIVE Final   C Diff interpretation No C. difficile detected.  Final    Comment: Performed at [facility], [place] [address], [place], [place] [address]        Radiology Studies: CT ABDOMEN PELVIS WO CONTRAST  Result Date: 11/27/2021 CLINICAL DATA:  Generalized abdominal pain, history of Crohn disease, recent liver biopsy EXAM: CT ABDOMEN AND PELVIS WITHOUT CONTRAST TECHNIQUE: Multidetector CT imaging of the abdomen and pelvis was performed following the standard protocol without IV contrast. Unenhanced CT was performed per clinician order. Lack of IV contrast limits sensitivity and specificity, especially for evaluation of abdominal/pelvic  solid viscera. COMPARISON:  11/11/2021, 11/11/2020 FINDINGS: Lower chest: No acute pleural or parenchymal lung disease. Hepatobiliary: Biliary dilation and pneumobilia are again identified, with high attenuation material within the intrahepatic and extrahepatic bile ducts likely refluxed oral contrast suggesting prior sphincterotomy or incompetent sphincter. Otherwise unremarkable unenhanced appearance of the liver. Gallbladder is absent. Pancreas: Unremarkable unenhanced appearance. Spleen: Unremarkable unenhanced appearance. Adrenals/Urinary Tract: No urinary tract calculi or obstructive uropathy. The adrenals are difficult to appreciate on this unenhanced exam. Bladder is unremarkable. Stomach/Bowel: Evaluation is limited without IV contrast. Oral contrast has traversed into the colon by the time of imaging. Multiple enterotomy staple lines are again seen within the mid abdomen. There is wall  thickening of the distal colon, extending from the splenic flexure through the rectosigmoid junction. Scattered colonic gas fluid levels are noted. Vascular/Lymphatic: Right femoral PICC again identified, tip at the level of the hepatic veins. No other significant vascular finding on this limited unenhanced exam. No discrete adenopathy. Reproductive: Status post hysterectomy. No adnexal masses. Other: No free fluid or free intraperitoneal gas. No abdominal wall hernia. Musculoskeletal: No acute or destructive bony lesions. Reconstructed images demonstrate no additional findings. IMPRESSION: 1. Distal colonic wall thickening consistent with inflammatory or infectious colitis. 2. No evidence of bowel obstruction or ileus. Scattered distal colonic gas fluid levels may reflect diarrhea or recent laxative/enema use. 3. Stable biliary duct dilation, with increased pneumobilia and reflux of oral contrast into the biliary tree. This likely reflects previous sphincterotomy. 4. Right femoral PICC, tip at level of the hepatic veins.  Electronically Signed   By: Randa Ngo M.D.   On: 11/27/2021 15:58   DG Chest Port 1 View  Result Date: 11/27/2021 CLINICAL DATA:  Sixty fever EXAM: PORTABLE CHEST 1 VIEW COMPARISON:  October 07, 2021 FINDINGS: The heart size and mediastinal contours are stable. Both lungs are clear. The visualized skeletal structures are unremarkable. IMPRESSION: No active disease. Electronically Signed   By: Abelardo Diesel M.D.   On: 11/27/2021 14:14   ECHOCARDIOGRAM COMPLETE  Result Date: 11/29/2021    ECHOCARDIOGRAM REPORT   Patient Name:   Caitlin Peters Date of Exam: 11/29/2021 Medical Rec #:  [id number]        Height:       68.0 in Accession #:    [id number]       Weight:       135.0 lb Date of Birth:  18-Mar-1961        BSA:          1.729 m Patient Age:    21 years         BP:           172/69 mmHg Patient Gender: F                HR:           65 bpm. Exam Location:  Inpatient Procedure: 2D Echo, 3D Echo, Cardiac Doppler, Color Doppler and Strain Analysis Indications:    Bacteremia  History:        Patient has prior history of Echocardiogram examinations, most                 recent 07/12/2021. Signs/Symptoms:Fever; Risk                 Factors:Hypertension.  Sonographer:    Roseanna Rainbow RDCS Referring Phys: [id number] [facility] T VU  Sonographer Comments: Global longitudinal strain was attempted. IMPRESSIONS  1. Left ventricular ejection fraction, by estimation, is 65 to 70%. Left ventricular ejection fraction by 3D volume is 67 %. The left ventricle has normal function. The left ventricle has no regional wall motion abnormalities. There is moderate left ventricular hypertrophy. Left ventricular diastolic parameters are consistent with Grade I diastolic dysfunction (impaired relaxation). The average left ventricular global longitudinal strain is -24.9 %. The global longitudinal strain is normal.  2. Right ventricular systolic function is normal. The right ventricular size is normal. There is normal pulmonary artery systolic  pressure.  3. Left atrial size was severely dilated.  4. The mitral valve is grossly normal. Mild mitral valve regurgitation.  5. The aortic valve is tricuspid. There is mild calcification of the  aortic valve. There is mild thickening of the aortic valve. Aortic valve regurgitation is not visualized. Aortic valve sclerosis is present, with no evidence of aortic valve stenosis.  6. The inferior vena cava is dilated in size with >50% respiratory variability, suggesting right atrial pressure of 8 mmHg. Comparison(s): Compared to prior TTE in 06/2021, there is no significant change. Conclusion(s)/Recommendation(s): No evidence of valvular vegetations on this transthoracic echocardiogram. Consider a transesophageal echocardiogram to exclude infective endocarditis if clinically indicated. FINDINGS  Left Ventricle: Left ventricular ejection fraction, by estimation, is 65 to 70%. Left ventricular ejection fraction by 3D volume is 67 %. The left ventricle has normal function. The left ventricle has no regional wall motion abnormalities. The average left ventricular global longitudinal strain is -24.9 %. The global longitudinal strain is normal. The left ventricular internal cavity size was normal in size. There is moderate left ventricular hypertrophy. Left ventricular diastolic parameters are consistent with Grade I diastolic dysfunction (impaired relaxation). Right Ventricle: The right ventricular size is normal. No increase in right ventricular wall thickness. Right ventricular systolic function is normal. There is normal pulmonary artery systolic pressure. The tricuspid regurgitant velocity is 2.45 m/s, and  with an assumed right atrial pressure of 3 mmHg, the estimated right ventricular systolic pressure is 46.8 mmHg. Left Atrium: Left atrial size was severely dilated. Right Atrium: Right atrial size was normal in size. Pericardium: There is no evidence of pericardial effusion. Mitral Valve: The mitral valve is grossly  normal. There is mild thickening of the mitral valve leaflet(s). Mild mitral annular calcification. Mild mitral valve regurgitation. Tricuspid Valve: The tricuspid valve is normal in structure. Tricuspid valve regurgitation is mild. Aortic Valve: The aortic valve is tricuspid. There is mild calcification of the aortic valve. There is mild thickening of the aortic valve. Aortic valve regurgitation is not visualized. Aortic valve sclerosis is present, with no evidence of aortic valve stenosis. Aortic valve mean gradient measures 8.0 mmHg. Aortic valve peak gradient measures 13.0 mmHg. Aortic valve area, by VTI measures 3.55 cm. Pulmonic Valve: The pulmonic valve was normal in structure. Pulmonic valve regurgitation is trivial. Aorta: The aortic root and ascending aorta are structurally normal, with no evidence of dilitation. Venous: The inferior vena cava is dilated in size with greater than 50% respiratory variability, suggesting right atrial pressure of 8 mmHg. IAS/Shunts: The atrial septum is grossly normal.  LEFT VENTRICLE PLAX 2D LVIDd:         4.15 cm         Diastology LVIDs:         2.00 cm         LV e' medial:    5.43 cm/s LV PW:         1.05 cm         LV E/e' medial:  16.8 LV IVS:        1.10 cm         LV e' lateral:   8.92 cm/s LVOT diam:     2.30 cm         LV E/e' lateral: 10.3 LV SV:         153 LV SV Index:   88              2D LVOT Area:     4.15 cm        Longitudinal  Strain                                2D Strain GLS  -24.9 % LV Volumes (MOD)               Avg: LV vol d, MOD    110.5 ml A2C:                           3D Volume EF LV vol d, MOD    81.7 ml       LV 3D EF:    Left A4C:                                        ventricul LV vol s, MOD    28.5 ml                    ar A2C:                                        ejection LV vol s, MOD    20.4 ml                    fraction A4C:                                        by 3D LV SV MOD A2C:   82.0 ml                     volume is LV SV MOD A4C:   81.7 ml                    67 %. LV SV MOD BP:    70.8 ml                                 3D Volume EF:                                3D EF:        67 %                                LV EDV:       173 ml                                LV ESV:       57 ml                                LV SV:        116 ml RIGHT VENTRICLE             IVC RV S prime:     10.50 cm/s  IVC diam: 2.30 cm TAPSE (M-mode): 2.0  cm LEFT ATRIUM             Index        RIGHT ATRIUM           Index LA diam:        4.40 cm 2.54 cm/m   RA Area:     13.90 cm LA Vol (A2C):   95.8 ml 55.40 ml/m  RA Volume:   33.30 ml  19.26 ml/m LA Vol (A4C):   78.3 ml 45.28 ml/m LA Biplane Vol: 91.4 ml 52.85 ml/m  AORTIC VALVE AV Area (Vmax):    3.62 cm AV Area (Vmean):   3.41 cm AV Area (VTI):     3.55 cm AV Vmax:           180.00 cm/s AV Vmean:          134.000 cm/s AV VTI:            0.431 m AV Peak Grad:      13.0 mmHg AV Mean Grad:      8.0 mmHg LVOT Vmax:         157.00 cm/s LVOT Vmean:        110.000 cm/s LVOT VTI:          0.368 m LVOT/AV VTI ratio: 0.85  AORTA Ao Root diam: 3.00 cm Ao Asc diam:  2.70 cm MITRAL VALVE                TRICUSPID VALVE MV Area (PHT): 2.93 cm     TR Peak grad:   24.0 mmHg MV Decel Time: 259 msec     TR Vmax:        245.00 cm/s MV E velocity: 91.45 cm/s MV A velocity: 106.00 cm/s  SHUNTS MV E/A ratio:  0.86         Systemic VTI:  0.37 m                             Systemic Diam: 2.30 cm Gwyndolyn Kaufman MD Electronically signed by Gwyndolyn Kaufman MD Signature Date/Time: 11/29/2021/11:59:23 AM    Final         Scheduled Meds:  [MAR Hold] amLODipine  10 mg Oral Daily   [MAR Hold] buPROPion ER  200 mg Oral Daily   [MAR Hold] calcium carbonate  1,250 mg Oral Q breakfast   [MAR Hold] carvedilol  12.5 mg Oral BID WC   [MAR Hold] Chlorhexidine Gluconate Cloth  6 each Topical Daily   [MAR Hold] cholecalciferol  1,000 Units Oral Daily   [MAR Hold] cycloSPORINE  1 drop Both Eyes  BID   [MAR Hold] DULoxetine  120 mg Oral Daily   [MAR Hold] estradiol  2 mg Oral Daily   [MAR Hold] feeding supplement  1 Container Oral TID BM   [MAR Hold] heparin injection (subcutaneous)  5,000 Units Subcutaneous Q8H   [MAR Hold] hydrALAZINE  100 mg Oral TID   [MAR Hold] HYDROmorphone  4 mg Oral QID   [MAR Hold] levETIRAcetam  500 mg Oral BID   [MAR Hold] lipase/protease/amylase  36,000 Units Oral TID with meals   [MAR Hold] methadone  5 mg Oral 5 X Daily   [MAR Hold] methocarbamol  500 mg Oral BID   [MAR Hold] multivitamin with minerals   Oral Daily   [MAR Hold] pantoprazole  40 mg Oral Daily   [MAR Hold] pantoprazole (PROTONIX) IV  40 mg Intravenous Q24H   [  MAR Hold] sodium chloride flush  10-40 mL Intracatheter Q12H   [MAR Hold] sucralfate  1 g Oral BID AC   [MAR Hold] Teduglutide (rDNA)  1.5 mg Subcutaneous Daily   [MAR Hold] vitamin B-12  1,000 mcg Oral Daily   Continuous Infusions:  sodium chloride 125 mL/hr at 11/29/21 1106   [MAR Hold] anidulafungin 100 mg (11/29/21 1108)     LOS: 2 days     Cordelia Poche, MD [facility] 11/29/2021, 12:29 PM  If 7PM-7AM, please contact night-coverage [email or web address]

## 2021-11-29 NOTE — Assessment & Plan Note (Signed)
Present on admission. Secondary to Candidemia.

## 2021-11-29 NOTE — Interval H&P Note (Signed)
History and Physical Interval Note: Patient tolerated bowel prep okay, here for colonoscopy this afternoon to clarify if active Crohn's disease or not. Pain seems improved from admission. I have discussed risks /benefits of colonoscopy and anesthesia with her and she wishes to proceed. Further recommendations pending the results.    11/29/2021 1:00 PM  Caitlin Peters  has presented today for surgery, with the diagnosis of Colitis.  The various methods of treatment have been discussed with the patient and family. After consideration of risks, benefits and other options for treatment, the patient has consented to  Procedure(s): COLONOSCOPY WITH PROPOFOL (N/A) as a surgical intervention.  [name] history has been reviewed, patient examined, no change in status, stable for surgery.  I have reviewed the patient's chart and labs.  Questions were answered to the patient's satisfaction.     [place]

## 2021-11-29 NOTE — Anesthesia Preprocedure Evaluation (Signed)
Anesthesia Evaluation  Patient identified by MRN, date of birth, ID band Patient awake    Reviewed: Allergy & Precautions, NPO status , Patient's Chart, lab work & pertinent test results  Airway Mallampati: II  TM Distance: >3 FB Neck ROM: Full    Dental no notable dental hx.    Pulmonary neg pulmonary ROS, former smoker,    Pulmonary exam normal        Cardiovascular hypertension, Pt. on medications and Pt. on home beta blockers  Rhythm:Regular Rate:Normal     Neuro/Psych Seizures -, Well Controlled,  Anxiety Depression    GI/Hepatic Neg liver ROS, GERD  ,Crohn's   Endo/Other  negative endocrine ROS  Renal/GU CRFRenal disease  negative genitourinary   Musculoskeletal  (+) Arthritis , Osteoarthritis,    Abdominal Normal abdominal exam  (+)   Peds  Hematology  (+) anemia ,   Anesthesia Other Findings   Reproductive/Obstetrics                             Anesthesia Physical Anesthesia Plan  ASA: 3  Anesthesia Plan: MAC   Post-op Pain Management:    Induction: Intravenous  PONV Risk Score and Plan: 2 and Propofol infusion and Treatment may vary due to age or medical condition  Airway Management Planned: Simple Face Mask, Natural Airway and Nasal Cannula  Additional Equipment: None  Intra-op Plan:   Post-operative Plan:   Informed Consent: I have reviewed the patients History and Physical, chart, labs and discussed the procedure including the risks, benefits and alternatives for the proposed anesthesia with the patient or authorized representative who has indicated his/her understanding and acceptance.     Dental advisory given  Plan Discussed with:   Anesthesia Plan Comments: (Lab Results      Component                Value               Date                      WBC                      2.3 (L)             11/28/2021                HGB                      7.5 (L)              11/28/2021                HCT                      22.7 (L)            11/28/2021                MCV                      93.8                11/28/2021                PLT                      138 (  L)             11/28/2021           Lab Results      Component                Value               Date                      NA                       136                 11/28/2021                K                        4.4                 11/28/2021                CO2                      24                  11/28/2021                GLUCOSE                  70                  11/28/2021                BUN                      37 (H)              11/28/2021                CREATININE               1.91 (H)            11/28/2021                CALCIUM                  9.2                 11/28/2021                GFRNONAA                 30 (L)              11/28/2021          )        Anesthesia Quick Evaluation

## 2021-11-29 NOTE — Op Note (Signed)
[facility] Patient Name: Caitlin Peters Procedure Date: 11/29/2021 MRN: [id number] Attending MD: Carlota Raspberry. Havery Moros , MD Date of Birth: 1961-02-28 CSN: [id number] Age: 61 Admit Type: Inpatient Procedure:                Colonoscopy Indications:              restage Crohn's disease - history of multiple small                            bowel resections, admitted with recurrent pain, CT                            suggested left sided colonic thickening. On no                            maintenance Crohn's therapy, last colonoscopy one                            year ago showed endoscopic remission. 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                            Colonoscopy to clarify if active Crohn's disease                            causing symptoms. Providers:                Carlota Raspberry. Havery Moros, MD, Mariana Arn, Cherylynn Ridges, Technician, Virgia Land, CRNA, Karis Juba, CRNA Referring MD:              Medicines:                Monitored Anesthesia Care Complications:            No immediate complications. Estimated blood loss:                            Minimal. Estimated Blood Loss:     Estimated blood loss was minimal. Procedure:                Pre-Anesthesia Assessment:                           - Prior to the procedure, a History and Physical                            was performed, and patient medications and  allergies were reviewed. [name] tolerance of                            previous anesthesia was also reviewed. The risks                            and benefits of the procedure and the sedation                            options and risks were discussed with the patient.                            All questions were answered, and informed consent                             was obtained. Prior Anticoagulants: The patient has                            taken no previous anticoagulant or antiplatelet                            agents. ASA Grade Assessment: III - A patient with                            severe systemic disease. After reviewing the risks                            and benefits, the patient was deemed in                            satisfactory condition to undergo the procedure.                           After obtaining informed consent, the colonoscope                            was passed under direct vision. Throughout the                            procedure, the patient's blood pressure, pulse, and                            oxygen saturations were monitored continuously. The                            PCF-HQ190L ([id number]) Olympus colonoscope was                            introduced through the anus and advanced to the the                            terminal ileum. The colonoscopy was performed  without difficulty. The patient tolerated the                            procedure well. The quality of the bowel                            preparation was adequate. The terminal ileum and                            the rectum were photographed. Scope In: 1:43:28 PM Scope Out: 2:17:06 PM Scope Withdrawal Time: 0 hours 22 minutes 16 seconds  Total Procedure Duration: 0 hours 33 minutes 38 seconds  Findings:      The perianal and digital rectal examinations were normal.      There was evidence of a prior end-to-side ileo-colonic anastomosis in       the suspected proximal transverse colon. This was patent and was       characterized by healthy appearing mucosa. The re was a lot of residual       stool in a blind pouch at the anatomosis which took a lot of time to       clear. Entrance to small intestine took some time to located given       residual stool but was widely patent without any  inflammatory changes.      The terminal ileum was intubated and appeared normal. No small bowel       inflammation.      The colon was tortuous.      A large amount of liquid stool was found in the entire colon, making       visualization difficult initially. Lavage of the colon was performed       using copious amounts of sterile water, resulting in clearance with       adequate visualization, this process took several minutes and prolonged       the exam.      Internal hemorrhoids were found during retroflexion. The hemorrhoids       were small.      The exam was otherwise without abnormality. No polyps. No colonic       inflammation Impression:               - Patent end-to-side ileo-colonic anastomosis,                            characterized by healthy appearing mucosa.                           - The examined portion of the ileum was normal.                           - Tortuous colon.                           - Stool in the entire examined colon which was                            lavaged.                           -  Internal hemorrhoids.                           - The examination was otherwise normal.                           No endoscopic evidence of any active Crohn's                            disease. In this light would not recommend any                            steroids / immunosuppression for Crohn's disease.                            Unclear if functional symptoms at this point.                            Patient noted to have "intolerance" listed to                            Levsin, consider trial of Bentyl or IB gard.                            Patient overall improved from admission symptoms at                            this time. On antifungal regimen per ID for                            candidemia / recurrent line infection - management                            per ID, line to be changed. Moderate Sedation:      No moderate sedation, case performed  with MAC Recommendation:           - Return patient to hospital ward for ongoing care.                           - Advance diet as tolerated.                           - Continue present medications.                           - No active Crohn's disease - advance diet as                            tolerated. No plans for steroids /                            immunosuppression at this time                           - Management of recurrent line  infection /                            candidemia per ID                           - Patient can follow up with [facility] clinic for                            short gut syndrome post discharge                           - GI signing off for now, please call with                            questions moving forward Procedure Code(s):        --- Professional ---                           [phone], Colonoscopy, flexible; diagnostic, including                            collection of specimen(s) by brushing or washing,                            when performed (separate procedure) Diagnosis Code(s):        --- Professional ---                           Z98.0, Intestinal bypass and anastomosis status                           K64.8, Other hemorrhoids                           K50.10, Crohn's disease of large intestine without                            complications                           Q43.8, Other specified congenital malformations of                            intestine CPT copyright 2019 [employer]. All rights reserved. The codes documented in this report are preliminary and upon coder review may  be revised to meet current compliance requirements. Remo Lipps P. Nuriya Stuck, MD 11/29/2021 2:33:10 PM This report has been signed electronically. Number of Addenda: 0

## 2021-11-29 NOTE — Progress Notes (Signed)
Patient ID: Caitlin Peters, female   DOB: 1960/12/04, 61 y.o.   MRN: [id number] Pt scheduled for rt fem tunneled CVC/PICC removal today secondary to fungemia. She has periph IV access now. Plan line holiday then placement of new line in few days. New order for this line will need to be placed when appropriate. Pt aware of plans. Bedside US yesterday revealed patency of left fem vein for new access.

## 2021-11-29 NOTE — Anesthesia Postprocedure Evaluation (Signed)
Anesthesia Post Note  Patient: Caitlin Peters  Procedure(s) Performed: COLONOSCOPY WITH PROPOFOL     Patient location during evaluation: Endoscopy Anesthesia Type: MAC Level of consciousness: awake and alert Pain management: pain level controlled Vital Signs Assessment: post-procedure vital signs reviewed and stable Respiratory status: spontaneous breathing, nonlabored ventilation, respiratory function stable and patient connected to nasal cannula oxygen Cardiovascular status: stable and blood pressure returned to baseline Postop Assessment: no apparent nausea or vomiting Anesthetic complications: no   No notable events documented.  Last Vitals:  Vitals:   11/29/21 1430 11/29/21 1440  BP:  (!) 149/50  Pulse: 65 65  Resp: 12 12  Temp:    SpO2: 100% 99%    Last Pain:  Vitals:   11/29/21 1440  TempSrc:   PainSc: 7                  Yvette Roark P Emelee Rodocker

## 2021-11-29 NOTE — Transfer of Care (Signed)
Immediate Anesthesia Transfer of Care Note  Patient: Caitlin Peters  Procedure(s) Performed: COLONOSCOPY WITH PROPOFOL  Patient Location: PACU and Endoscopy Unit  Anesthesia Type:MAC  Level of Consciousness: awake, alert  and oriented  Airway & Oxygen Therapy: Patient Spontanous Breathing and Patient connected to face mask oxygen  Post-op Assessment: Report given to RN and Post -op Vital signs reviewed and stable  Post vital signs: Reviewed and stable  Last Vitals:  Vitals Value Taken Time  BP    Temp    Pulse 66 11/29/21 1422  Resp 9 11/29/21 1422  SpO2 100 % 11/29/21 1422  Vitals shown include unvalidated device data.  Last Pain:  Vitals:   11/29/21 1234  TempSrc: Oral  PainSc: 8       Patients Stated Pain Goal: 2 (79/15/05 6979)  Complications: No notable events documented.

## 2021-11-29 NOTE — Assessment & Plan Note (Addendum)
Chronic issue. Hemoglobin drifted down. No overt bleeding noted. Previous iron panel in 6/22 was normal, although ferritin was on the low end of normal. Associated normocytosis. Patient possibly with hemoconcentration on admission. Hemoglobin stable. -Transfuse for hemoglobin <7

## 2021-11-29 NOTE — Assessment & Plan Note (Signed)
Baseline creatinine 1.5-1.6

## 2021-11-29 NOTE — Hospital Course (Addendum)
Caitlin Peters is a 61 y.o. 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 Patient presented secondary to abdominal pain, vomiting and fever with concern for Crohn flare but found to have associated Candidemia. Eraxis started with transition to Fluconazole IV. TPN held and PICC line removed on 1/4. Colonoscopy on 1/4 negative for evidence of Crohn flare. Repeat blood cultures no growth to date. Plan for PICC reinsertion 1/9.

## 2021-11-29 NOTE — Progress Notes (Signed)
Brief progress note  Patient at endoscopy. Not able to visit Bcx grew candida lusitaniae which should be sensitive to fluconazole  Pending right femoral picc removal Ok to change abx from echinocandin to IV fluconazole Discussed with ID pharmacy F/u tte result F/u repeat bcx

## 2021-11-29 NOTE — Plan of Care (Signed)

## 2021-11-29 NOTE — Plan of Care (Signed)
  Problem: Education: Goal: Knowledge of General Education information will improve Description: Including pain rating scale, medication(s)/side effects and non-pharmacologic comfort measures Outcome: Progressing   Problem: Pain Managment: Goal: General experience of comfort will improve Outcome: Progressing   Problem: Safety: Goal: Ability to remain free from injury will improve Outcome: Progressing   

## 2021-11-29 NOTE — Anesthesia Procedure Notes (Signed)
Procedure Name: MAC Date/Time: 11/29/2021 1:32 PM Performed by: Maxwell Caul, CRNA Pre-anesthesia Checklist: Patient identified, Emergency Drugs available, Suction available and Patient being monitored Patient Re-evaluated:Patient Re-evaluated prior to induction Oxygen Delivery Method: Simple face mask

## 2021-11-29 NOTE — Assessment & Plan Note (Addendum)
Recurrent issue. In setting of tunneled IV for chronic TPN. Initial blood cultures significant for Candida Lusitaniae. Patient empirically started on Eraxis. Infectious disease on board and patient transitioned to fluconazole 400 mg x 1 dose tapering to fluconazole 200 mg daily. Transthoracic Echocardiogram (1/4) obtained and negative for evidence of valvular vegetations. Repeat blood culture (1/4) with no growth to date. -Infectious disease recommendations (1/5): Continue fluconazole with plan for 2 weeks of treatment (1/4 - 1/18), outpatient ophthalmology followup, await susceptibilities, outpatient ID follow-up on 1/31 at 11:30 AM.

## 2021-11-29 NOTE — Progress Notes (Signed)
°  Echocardiogram 2D Echocardiogram has been performed.  Bobbye Charleston 11/29/2021, 9:32 AM

## 2021-11-30 LAB — COMPREHENSIVE METABOLIC PANEL
ALT: 156 U/L — ABNORMAL HIGH (ref 0–44)
AST: 32 U/L (ref 15–41)
Albumin: 2.3 g/dL — ABNORMAL LOW (ref 3.5–5.0)
Alkaline Phosphatase: 311 U/L — ABNORMAL HIGH (ref 38–126)
Anion gap: 5 (ref 5–15)
BUN: 20 mg/dL (ref 6–20)
CO2: 20 mmol/L — ABNORMAL LOW (ref 22–32)
Calcium: 8.7 mg/dL — ABNORMAL LOW (ref 8.9–10.3)
Chloride: 113 mmol/L — ABNORMAL HIGH (ref 98–111)
Creatinine, Ser: 1.77 mg/dL — ABNORMAL HIGH (ref 0.44–1.00)
GFR, Estimated: 33 mL/min — ABNORMAL LOW (ref >60)
Glucose, Bld: 128 mg/dL — ABNORMAL HIGH (ref 70–99)
Potassium: 3.6 mmol/L (ref 3.5–5.1)
Sodium: 138 mmol/L (ref 135–145)
Total Bilirubin: 0.7 mg/dL (ref 0.3–1.2)
Total Protein: 5.6 g/dL — ABNORMAL LOW (ref 6.5–8.1)

## 2021-11-30 LAB — CBC
HCT: 23 % — ABNORMAL LOW (ref 36.0–46.0)
Hemoglobin: 7.5 g/dL — ABNORMAL LOW (ref 12.0–15.0)
MCH: 31.4 pg (ref 26.0–34.0)
MCHC: 32.6 g/dL (ref 30.0–36.0)
MCV: 96.2 fL (ref 80.0–100.0)
Platelets: 156 10*3/uL (ref 150–400)
RBC: 2.39 MIL/uL — ABNORMAL LOW (ref 3.87–5.11)
RDW: 13.4 % (ref 11.5–15.5)
WBC: 2.5 10*3/uL — ABNORMAL LOW (ref 4.0–10.5)
nRBC: 0 % (ref 0.0–0.2)

## 2021-11-30 NOTE — Progress Notes (Addendum)
PROGRESS NOTE    Caitlin Peters  CHY:[id number] DOB: 12/04/1960 DOA: 11/27/2021 PCP: [name], [name], MD   Brief Narrative: Caitlin Peters is a 61 y.o. 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 Patient presented secondary to abdominal pain, vomiting and fever with concern for Crohn flare but found to have associated Candidemia. Fluconazole IV initiated. TPN held with plan for removal of PICC. Colonoscopy on 1/4 negative for evidence of Crohn flare.   Assessment & Plan:   * 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 Colonoscopy not consistent with Crohn flare. GI consulted. GI pathogen panel and C. Difficile ordered but no sample to date. Previously on budesonide but discontinued in 2022.  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 In setting of tunneled IV for chronic TPN. Initial blood cultures significant for Candida Lusitaniae. Patient empirically started on Eraxis. Infectious disease on board and patient transitioned to fluconazole 400 mg x 1 dose tapering to fluconazole 200 mg daily. Transthoracic Echocardiogram (1/4) obtained and negative for evidence of valvular vegetations. Repeat blood culture (1/4) with no growth to date. -Infectious disease recommendations: pending today; continue fluconazole, follow-up blood cultures  PICC (peripherally inserted central catheter) in place Limited IV access. Currently with right femoral vein PICC line which needs to be removed secondary to candidemia. IR informed and PICC line successfully removed on 1/4  [name]- (present on admission) Present on admission. Secondary to Candidemia.  Anemia- (present on admission) Chronic issue. Hemoglobin drifted down. No overt bleeding noted. Previous iron  panel in 6/22 was normal, although ferritin was on the low end of normal. Associated normocytosis. Patient possibly with hemoconcentration on admission -Transfuse for hemoglobin <7  LFTs abnormal- (present on admission) Chronic. 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  [name]- (present on admission) Creatinine of 2.29 no admission. Likely secondary to decreased oral intake and diarrhea with suspected Crohn flare. No hydronephrosis on CT imaging. Improving with IV fluids. -Continue IV fluids -BMP in AM   [name] -Continue Keppra  Chronic kidney disease, [name]- (present on admission) Baseline creatinine 1.5-1.6  Abdominal pain- (present on admission) Secondary to colitis. Negative C. Difficile/GI pathogen panel testing. Colonoscopy performed on 1/4 without evidence of active Crohn. Abdominal pain is improved and patient currently has her chronic pain.  Chronic pain syndrome- (present on admission) -Continue home meds (methadone, dilaudid PO) with prn IV breakthrough  Depression- (present on admission) -Continue Wellbutrin and Cymbalta  [name] Hx crohn's with prior resection. No active flare.  Hypertension- (present on admission) -Continue Amlodipine, Coreg and hydralazine  Bacteremia associated with intravascular line ([name])-resolved as of 11/29/2021, (present on admission) Patient treated on previous admission and completed antibiotic course on 11/27/21. At that time, blood culture positive for stenotrophomonas maltophilia and pseudomonas putida. Repeat blood culture this admission shows treatment of bacteremia.  Pancreatic insufficiency -Continue Creon  Vitamin B12 deficiency- (present on admission) -Continue B12 supplementation  Short gut syndrome- (present on admission) -Continue  teduglutide    DVT prophylaxis: Heparin subq Code Status:   Code Status: Full Code Family Communication: None at bedside Disposition Plan: Discharge likely in 5+ days pending cleared blood cultures, replacement of PICC for TPN   Consultants:  [facility] Infectious disease Interventional radiology  Procedures:  TRANSTHORACIC ECHOCARDIOGRAM (11/29/2021) IMPRESSIONS     1. Left ventricular ejection fraction, by estimation, is 65 to 70%. Left  ventricular ejection fraction by 3D volume is 67 %. The left ventricle has  normal function. The left ventricle has no regional wall motion  abnormalities. There is moderate left  ventricular hypertrophy. Left ventricular diastolic parameters are  consistent with Grade I diastolic dysfunction (impaired relaxation). The  average left ventricular global longitudinal strain is -24.9 %. The global  longitudinal strain is normal.   2. Right ventricular systolic function is normal. The right ventricular  size is normal. There is normal pulmonary artery systolic pressure.   3. Left atrial size was severely dilated.   4. The mitral valve is grossly normal. Mild mitral valve regurgitation.   5. The aortic valve is tricuspid. There is mild calcification of the  aortic valve. There is mild thickening of the aortic valve. Aortic valve  regurgitation is not visualized. Aortic valve sclerosis is present, with  no evidence of aortic valve stenosis.   6. The inferior vena cava is dilated in size with >50% respiratory  variability, suggesting right atrial pressure of 8 mmHg.   COLONOSCOPY (11/29/2021) Impression:               - Patent end-to-side ileo-colonic anastomosis,                            characterized by healthy appearing mucosa.                           - The examined portion of the ileum was normal.                           - Tortuous colon.                           - Stool in the entire examined colon which was                             lavaged.                           - Internal hemorrhoids.                           - The examination was otherwise normal.                           No endoscopic evidence of any active Crohn's                            disease. In this light would not recommend any                            steroids / immunosuppression for Crohn's disease.                            Unclear if functional symptoms at this point.  Patient noted to have "intolerance" listed to                            Levsin, consider trial of Bentyl or IB gard.                            Patient overall improved from admission symptoms at                            this time. On antifungal regimen per ID for                            candidemia / recurrent line infection - management                            per ID, line to be changed.  Recommendation:           - Return patient to hospital ward for ongoing care.                           - Advance diet as tolerated.                           - Continue present medications.                           - No active Crohn's disease - advance diet as                            tolerated. No plans for steroids /                            immunosuppression at this time                           - Management of recurrent line infection /                            candidemia per ID                           - Patient can follow up with [facility] for                            short gut syndrome post discharge                           - GI signing off for now, please call with                            questions moving forward   Antimicrobials: Levaquin IV Eraxis Fluconazole IV    Subjective: Abdominal pain is her chronic pain. No issues overnight. PICC line removed without issue yesterday.  Objective: Vitals:   11/29/21 2138 11/30/21 0551 11/30/21 0840 11/30/21 1114  BP: (!) 141/66 [name] Kitchen)  149/62 (!) 159/68 (!) 164/66   Pulse: 73 63 60   Resp: 18 18    Temp: 98.5 F (36.9 C) 98.4 F (36.9 C)    TempSrc: Oral Oral    SpO2: 98% 98%    Weight:      Height:        Intake/Output Summary (Last 24 hours) at 11/30/2021 1401 Last data filed at 11/30/2021 0653 Gross per 24 hour  Intake 2327.93 ml  Output 0 ml  Net 2327.93 ml    Filed Weights   11/27/21 1225 11/29/21 1234  Weight: 61.2 kg 62 kg    Examination:  General exam: Appears calm and comfortable Respiratory system: Clear to auscultation. Respiratory effort normal. Cardiovascular system: S1 & S2 heard, RRR. No murmurs, rubs, gallops or clicks. Gastrointestinal system: Abdomen is nondistended, soft and generally tender. No organomegaly or masses felt. Normal bowel sounds heard. Central nervous system: Alert and oriented. No focal neurological deficits. Musculoskeletal: No edema. No calf tenderness Skin: No cyanosis. No rashes Psychiatry: Judgement and insight appear normal. Mood & affect appropriate.     Data Reviewed: I have personally reviewed following labs and imaging studies  CBC Lab Results  Component Value Date   WBC 2.5 (L) 11/30/2021   RBC 2.39 (L) 11/30/2021   HGB 7.5 (L) 11/30/2021   HCT 23.0 (L) 11/30/2021   MCV 96.2 11/30/2021   MCH 31.4 11/30/2021   PLT 156 11/30/2021   MCHC 32.6 11/30/2021   RDW 13.4 11/30/2021   LYMPHSABS 0.3 (L) 10/08/2021   MONOABS 0.2 10/08/2021   EOSABS 0.0 10/08/2021   BASOSABS 0.0 21/19/4174     Last metabolic panel Lab Results  Component Value Date   NA 138 11/30/2021   K 3.6 11/30/2021   CL 113 (H) 11/30/2021   CO2 20 (L) 11/30/2021   BUN 20 11/30/2021   CREATININE 1.77 (H) 11/30/2021   GLUCOSE 128 (H) 11/30/2021   GFRNONAA 33 (L) 11/30/2021   GFRAA 55 (L) 08/12/2020   CALCIUM 8.7 (L) 11/30/2021   PHOS 3.9 11/28/2021   PROT 5.6 (L) 11/30/2021   ALBUMIN 2.3 (L) 11/30/2021   BILITOT 0.7 11/30/2021   ALKPHOS 311 (H) 11/30/2021   AST 32 11/30/2021   ALT 156 (H) 11/30/2021    ANIONGAP 5 11/30/2021    CBG (last 3)  No results for input(s): GLUCAP in the last 72 hours.   GFR: Estimated Creatinine Clearance: 33.1 mL/min (A) (by C-G formula based on SCr of 1.77 mg/dL (H)).  Coagulation Profile: No results for input(s): INR, PROTIME in the last 168 hours.  Recent Results (from the past 240 hour(s))  Blood culture (routine x 2)     Status: Abnormal (Preliminary result)   Collection Time: 11/27/21 12:20 PM   Specimen: BLOOD  Result Value Ref Range Status   Specimen Description   Final    BLOOD RIGHT ANTECUBITAL Performed at [facility] [address] [place], [place] [address]    Special Requests   Final    BOTTLES DRAWN AEROBIC AND ANAEROBIC Blood Culture adequate volume Performed at [facility] [address] [place], [place] [address]    Culture  Setup Time (A)  Final    YEAST AEROBIC BOTTLE ONLY CRITICAL RESULT CALLED TO, READ BACK BY AND VERIFIED WITH: PHARM D M.TUCKER ON [id number] BY E.PARRISH    Culture (A)  Final    CANDIDA LUSITANIAE Sent to [facility] for further susceptibility testing. Performed at [facility],  [address] [place], [place] [address]    Report Status PENDING  Incomplete  Blood Culture ID Panel (Reflexed)     Status: None   Collection Time: 11/27/21 12:20 PM  Result Value Ref Range Status   Enterococcus faecalis NOT DETECTED NOT DETECTED Final   Enterococcus Faecium NOT DETECTED NOT DETECTED Final   Listeria monocytogenes NOT DETECTED NOT DETECTED Final   Staphylococcus species NOT DETECTED NOT DETECTED Final   Staphylococcus aureus (BCID) NOT DETECTED NOT DETECTED Final   Staphylococcus epidermidis NOT DETECTED NOT DETECTED Final   Staphylococcus lugdunensis NOT DETECTED NOT DETECTED Final   Streptococcus species NOT DETECTED NOT DETECTED Final   Streptococcus agalactiae NOT DETECTED NOT DETECTED Final   Streptococcus pneumoniae NOT DETECTED NOT DETECTED Final    Streptococcus pyogenes NOT DETECTED NOT DETECTED Final   A.calcoaceticus-baumannii NOT DETECTED NOT DETECTED Final   Bacteroides fragilis NOT DETECTED NOT DETECTED Final   Enterobacterales NOT DETECTED NOT DETECTED Final   Enterobacter cloacae complex NOT DETECTED NOT DETECTED Final   Escherichia coli NOT DETECTED NOT DETECTED Final   Klebsiella aerogenes NOT DETECTED NOT DETECTED Final   Klebsiella oxytoca NOT DETECTED NOT DETECTED Final   Klebsiella pneumoniae NOT DETECTED NOT DETECTED Final   Proteus species NOT DETECTED NOT DETECTED Final   Salmonella species NOT DETECTED NOT DETECTED Final   Serratia marcescens NOT DETECTED NOT DETECTED Final   Haemophilus influenzae NOT DETECTED NOT DETECTED Final   Neisseria meningitidis NOT DETECTED NOT DETECTED Final   Pseudomonas aeruginosa NOT DETECTED NOT DETECTED Final   Stenotrophomonas maltophilia NOT DETECTED NOT DETECTED Final   Candida albicans NOT DETECTED NOT DETECTED Final   Candida auris NOT DETECTED NOT DETECTED Final   Candida glabrata NOT DETECTED NOT DETECTED Final   Candida krusei NOT DETECTED NOT DETECTED Final   Candida parapsilosis NOT DETECTED NOT DETECTED Final   Candida tropicalis NOT DETECTED NOT DETECTED Final   Cryptococcus neoformans/gattii NOT DETECTED NOT DETECTED Final    Comment: Performed at [facility], [address] [place] [address] [place], [place] [address]  Blood culture (routine x 2)     Status: None (Preliminary result)   Collection Time: 11/27/21  9:26 PM   Specimen: BLOOD  Result Value Ref Range Status   Specimen Description   Final    BLOOD BLOOD LEFT HAND Performed at [facility] [address] [place], [place] [address]    Special Requests   Final    BOTTLES DRAWN AEROBIC ONLY Blood Culture adequate volume Performed at [facility] [address] [name],  [address]    Culture  Setup Time   Final    YEAST AEROBIC BOTTLE ONLY CRITICAL VALUE NOTED.   VALUE IS CONSISTENT WITH PREVIOUSLY REPORTED AND CALLED VALUE.    Culture   Final    YEAST IDENTIFICATION TO FOLLOW Performed at [facility], [place] [address] [place],  [address]    Report Status PENDING  Incomplete  Gastrointestinal Panel by PCR , Stool     Status: None   Collection Time: 11/28/21  1:30 AM   Specimen: STOOL  Result Value Ref Range Status   Campylobacter species NOT DETECTED NOT DETECTED Final   Plesimonas shigelloides NOT DETECTED NOT DETECTED Final   Salmonella species NOT DETECTED NOT DETECTED Final   Yersinia enterocolitica NOT DETECTED NOT DETECTED Final   Vibrio species NOT DETECTED NOT DETECTED Final   Vibrio cholerae NOT DETECTED NOT DETECTED Final   Enteroaggregative  E coli (EAEC) NOT DETECTED NOT DETECTED Final   Enteropathogenic E coli (EPEC) NOT DETECTED NOT DETECTED Final   Enterotoxigenic E coli (ETEC) NOT DETECTED NOT DETECTED Final   Shiga like toxin producing E coli (STEC) NOT DETECTED NOT DETECTED Final   Shigella/Enteroinvasive E coli (EIEC) NOT DETECTED NOT DETECTED Final   Cryptosporidium NOT DETECTED NOT DETECTED Final   Cyclospora cayetanensis NOT DETECTED NOT DETECTED Final   Entamoeba histolytica NOT DETECTED NOT DETECTED Final   Giardia lamblia NOT DETECTED NOT DETECTED Final   Adenovirus F40/41 NOT DETECTED NOT DETECTED Final   Astrovirus NOT DETECTED NOT DETECTED Final   Norovirus GI/GII NOT DETECTED NOT DETECTED Final   Rotavirus A NOT DETECTED NOT DETECTED Final   Sapovirus (I, II, IV, and V) NOT DETECTED NOT DETECTED Final    Comment: Performed at [facility], [address] [place], [place] [address]  C Difficile Quick Screen w PCR reflex     Status: None   Collection Time: 11/28/21  1:30 AM   Specimen: STOOL  Result Value Ref Range Status   C Diff antigen NEGATIVE NEGATIVE Final   C Diff toxin NEGATIVE NEGATIVE Final   C Diff interpretation No C. difficile detected.  Final    Comment: Performed at  [facility], [place] [address], [place], [place] [address]  Culture, blood (routine x 2)     Status: None (Preliminary result)   Collection Time: 11/29/21  6:15 AM   Specimen: BLOOD RIGHT HAND  Result Value Ref Range Status   Specimen Description   Final    BLOOD RIGHT HAND Performed at [facility] [address] [place], [place] [address]    Special Requests   Final    BOTTLES DRAWN AEROBIC ONLY Blood Culture adequate volume Performed at [facility] [address] [place], [place] [address]    Culture   Final    NO GROWTH 1 DAY Performed at [facility], [place] [address] [place], [place] [address]    Report Status PENDING  Incomplete  Culture, blood (routine x 2)     Status: None (Preliminary result)   Collection Time: 11/29/21  6:16 AM   Specimen: BLOOD  Result Value Ref Range Status   Specimen Description   Final    BLOOD Performed at [facility] [address], [place], [place] [address]    Special Requests   Final    BOTTLES DRAWN AEROBIC ONLY Blood Culture results may not be optimal due to an inadequate volume of blood received in culture bottles Performed at [facility] [address], [place],  [address]    Culture   Final    NO GROWTH 1 DAY Performed at [facility], [place] [address] [place],  [address]    Report Status PENDING  Incomplete        Radiology Studies: IR Removal Tun Cv Cath W/O FL  Result Date: 11/30/2021 CLINICAL DATA:  Long-term indwelling right femoral venous catheter for TPN. Multiple upper extremity central venous obstructions. Fungemia. Catheter removal requested. EXAM: EXAM TUNNELED CENTRAL VENOUS CATHETER REMOVAL TECHNIQUE: The procedure, risks (including but not limited to bleeding, infection, organ damage ), benefits, and alternatives were explained to the patient. Questions regarding the procedure were encouraged and  answered. The patient understands and consents to the procedure. The dressing was removed. The 3 retention sutures were cut and removed in their entirety. The right femoral  tunneled venous catheter was removed intact. Hemostasis was achieved. Site covered with sterile dressing. The patient tolerated the procedure well. COMPLICATIONS: COMPLICATIONS None immediate IMPRESSION: 1. Technically successful tunneled right femoral venous catheter removal. Electronically Signed   By: Lucrezia Europe M.D.   On: 11/30/2021 11:03   ECHOCARDIOGRAM COMPLETE  Result Date: 11/29/2021    ECHOCARDIOGRAM REPORT   Patient Name:   Caitlin Peters Date of Exam: 11/29/2021 Medical Rec #:  [id number]        Height:       68.0 in Accession #:    [id number]       Weight:       135.0 lb Date of Birth:  04-Jul-1961        BSA:          1.729 m Patient Age:    50 years         BP:           172/69 mmHg Patient Gender: F                HR:           65 bpm. Exam Location:  Inpatient Procedure: 2D Echo, 3D Echo, Cardiac Doppler, Color Doppler and Strain Analysis Indications:    Bacteremia  History:        Patient has prior history of Echocardiogram examinations, most                 recent 07/12/2021. Signs/Symptoms:Fever; Risk                 Factors:Hypertension.  Sonographer:    Roseanna Rainbow RDCS Referring Phys: [id number] [facility] T VU  Sonographer Comments: Global longitudinal strain was attempted. IMPRESSIONS  1. Left ventricular ejection fraction, by estimation, is 65 to 70%. Left ventricular ejection fraction by 3D volume is 67 %. The left ventricle has normal function. The left ventricle has no regional wall motion abnormalities. There is moderate left ventricular hypertrophy. Left ventricular diastolic parameters are consistent with Grade I diastolic dysfunction (impaired relaxation). The average left ventricular global longitudinal strain is -24.9 %. The global longitudinal strain is normal.  2. Right ventricular systolic function is normal. The right  ventricular size is normal. There is normal pulmonary artery systolic pressure.  3. Left atrial size was severely dilated.  4. The mitral valve is grossly normal. Mild mitral valve regurgitation.  5. The aortic valve is tricuspid. There is mild calcification of the aortic valve. There is mild thickening of the aortic valve. Aortic valve regurgitation is not visualized. Aortic valve sclerosis is present, with no evidence of aortic valve stenosis.  6. The inferior vena cava is dilated in size with >50% respiratory variability, suggesting right atrial pressure of 8 mmHg. Comparison(s): Compared to prior TTE in 06/2021, there is no significant change. Conclusion(s)/Recommendation(s): No evidence of valvular vegetations on this transthoracic echocardiogram. Consider a transesophageal echocardiogram to exclude infective endocarditis if clinically indicated. FINDINGS  Left Ventricle: Left ventricular ejection fraction, by estimation, is 65 to 70%. Left ventricular ejection fraction by 3D volume is 67 %. The left ventricle has normal function. The left ventricle has no regional wall motion abnormalities. The average left ventricular global longitudinal strain is -24.9 %. The global longitudinal strain is normal. The left ventricular internal cavity size was normal in size. There is moderate left ventricular hypertrophy. Left ventricular diastolic parameters are consistent with Grade I diastolic dysfunction (impaired relaxation). Right Ventricle: The right ventricular size is normal. No increase  in right ventricular wall thickness. Right ventricular systolic function is normal. There is normal pulmonary artery systolic pressure. The tricuspid regurgitant velocity is 2.45 m/s, and  with an assumed right atrial pressure of 3 mmHg, the estimated right ventricular systolic pressure is 61.9 mmHg. Left Atrium: Left atrial size was severely dilated. Right Atrium: Right atrial size was normal in size. Pericardium: There is no  evidence of pericardial effusion. Mitral Valve: The mitral valve is grossly normal. There is mild thickening of the mitral valve leaflet(s). Mild mitral annular calcification. Mild mitral valve regurgitation. Tricuspid Valve: The tricuspid valve is normal in structure. Tricuspid valve regurgitation is mild. Aortic Valve: The aortic valve is tricuspid. There is mild calcification of the aortic valve. There is mild thickening of the aortic valve. Aortic valve regurgitation is not visualized. Aortic valve sclerosis is present, with no evidence of aortic valve stenosis. Aortic valve mean gradient measures 8.0 mmHg. Aortic valve peak gradient measures 13.0 mmHg. Aortic valve area, by VTI measures 3.55 cm. Pulmonic Valve: The pulmonic valve was normal in structure. Pulmonic valve regurgitation is trivial. Aorta: The aortic root and ascending aorta are structurally normal, with no evidence of dilitation. Venous: The inferior vena cava is dilated in size with greater than 50% respiratory variability, suggesting right atrial pressure of 8 mmHg. IAS/Shunts: The atrial septum is grossly normal.  LEFT VENTRICLE PLAX 2D LVIDd:         4.15 cm         Diastology LVIDs:         2.00 cm         LV e' medial:    5.43 cm/s LV PW:         1.05 cm         LV E/e' medial:  16.8 LV IVS:        1.10 cm         LV e' lateral:   8.92 cm/s LVOT diam:     2.30 cm         LV E/e' lateral: 10.3 LV SV:         153 LV SV Index:   88              2D LVOT Area:     4.15 cm        Longitudinal                                Strain                                2D Strain GLS  -24.9 % LV Volumes (MOD)               Avg: LV vol d, MOD    110.5 ml A2C:                           3D Volume EF LV vol d, MOD    81.7 ml       LV 3D EF:    Left A4C:                                        ventricul LV vol s, MOD    28.5 ml  ar A2C:                                        ejection LV vol s, MOD    20.4 ml                    fraction A4C:                                         by 3D LV SV MOD A2C:   82.0 ml                    volume is LV SV MOD A4C:   81.7 ml                    67 %. LV SV MOD BP:    70.8 ml                                 3D Volume EF:                                3D EF:        67 %                                LV EDV:       173 ml                                LV ESV:       57 ml                                LV SV:        116 ml RIGHT VENTRICLE             IVC RV S prime:     10.50 cm/s  IVC diam: 2.30 cm TAPSE (M-mode): 2.0 cm LEFT ATRIUM             Index        RIGHT ATRIUM           Index LA diam:        4.40 cm 2.54 cm/m   RA Area:     13.90 cm LA Vol (A2C):   95.8 ml 55.40 ml/m  RA Volume:   33.30 ml  19.26 ml/m LA Vol (A4C):   78.3 ml 45.28 ml/m LA Biplane Vol: 91.4 ml 52.85 ml/m  AORTIC VALVE AV Area (Vmax):    3.62 cm AV Area (Vmean):   3.41 cm AV Area (VTI):     3.55 cm AV Vmax:           180.00 cm/s AV Vmean:          134.000 cm/s AV VTI:            0.431 m AV Peak Grad:      13.0 mmHg AV Mean Grad:      8.0 mmHg LVOT Vmax:  157.00 cm/s LVOT Vmean:        110.000 cm/s LVOT VTI:          0.368 m LVOT/AV VTI ratio: 0.85  AORTA Ao Root diam: 3.00 cm Ao Asc diam:  2.70 cm MITRAL VALVE                TRICUSPID VALVE MV Area (PHT): 2.93 cm     TR Peak grad:   24.0 mmHg MV Decel Time: 259 msec     TR Vmax:        245.00 cm/s MV E velocity: 91.45 cm/s MV A velocity: 106.00 cm/s  SHUNTS MV E/A ratio:  0.86         Systemic VTI:  0.37 m                             Systemic Diam: 2.30 cm Gwyndolyn Kaufman MD Electronically signed by Gwyndolyn Kaufman MD Signature Date/Time: 11/29/2021/11:59:23 AM    Final         Scheduled Meds:  amLODipine  10 mg Oral Daily   buPROPion ER  200 mg Oral Daily   calcium carbonate  1,250 mg Oral Q breakfast   carvedilol  12.5 mg Oral BID WC   Chlorhexidine Gluconate Cloth  6 each Topical Daily   cholecalciferol  1,000 Units Oral Daily   cycloSPORINE  1 drop Both Eyes  BID   DULoxetine  120 mg Oral Daily   estradiol  2 mg Oral Daily   feeding supplement  1 Container Oral TID BM   heparin injection (subcutaneous)  5,000 Units Subcutaneous Q8H   hydrALAZINE  100 mg Oral TID   HYDROmorphone  4 mg Oral QID   levETIRAcetam  500 mg Oral BID   lipase/protease/amylase  36,000 Units Oral TID with meals   methadone  5 mg Oral 5 X Daily   methocarbamol  500 mg Oral BID   multivitamin with minerals   Oral Daily   pantoprazole  40 mg Oral Daily   sodium chloride flush  10-40 mL Intracatheter Q12H   sucralfate  1 g Oral BID AC   Teduglutide (rDNA)  1.5 mg Subcutaneous Daily   vitamin B-12  1,000 mcg Oral Daily   Continuous Infusions:  sodium chloride 125 mL/hr at 11/29/21 2031   fluconazole (DIFLUCAN) IV     Followed by   Derrill Memo ON 12/01/2021] fluconazole (DIFLUCAN) IV       LOS: 3 days     Cordelia Poche, MD [facility] 11/30/2021, 2:01 PM  If 7PM-7AM, please contact night-coverage [email or web address]

## 2021-11-30 NOTE — Plan of Care (Signed)

## 2021-11-30 NOTE — Progress Notes (Signed)
[facility] for Infectious Disease  Date of Admission:  11/27/2021     Lines:  Old right femoral line   Abx: 1/04-c iv fluconazole renally dose for equivalence 6 mg/kg/day  1/03-1/04 anidulafungin                                                      Assessment: Yeast/fungemia Recurrent central line associated blood stream infection [id number] Crohn's disease Short gut syndrome  Abdominal pain/diarrhea Lft elevation     She presented with 2 days gi sx and fever. Has similar presentation with blood stream infection previously. GI follow as well to r/o crohn's flare   She just finished 2 weeks of abx lock and iv levofloxacin for pseudomonas putida and steno CLABSI, and just prior to that has guidewire catheter exchange of the right femoral line as it wasn't functioning, just after finishing another 2 weeks abx lock therapy/systemic abx for acinetobacter/klebsiella bsi   She is awared of progressive limitation of line access. At this time she continues to desire full treatment; she is functional/healthy otherwise.   Reviewed line care with her -- appears she is cooperating with [facility] and doing what she is told to do   Difficult situation     Of note, she recently has very high lft elevation, which is much better now. She had a liver biopsy by IR on 12/20 that showed mild nonspecific portal inflammation and nonspecific scattered intracytoplasmic inclusion similar to [place] bodies-like inclusions (ddx include crohns, drugs, hep b, gvhd, dm2 related), but no fibrosis  ---------- 1/05 assessment S/p endoscopy 1/04 --> no sign active crohn's disease Right femoral line removed 1/04 Bcx from 1/04 ngtd Doing well. Afebrile Tolerating PO intake. Improving diarrhea/abd pain 1/04 tte no new changes from prior echo or evidence endocarditis Continues to have no problem with vision   Plan: Advised patient to setup outpatient ophthalmology eye exam within 1-2 weeks discharge to  r/o involvement of the eye by fungemia (no sx now) F/u repeat bcx Line holiday -- would be reasonable to place new line on Monday next week  Susceptibility for candida lusitaniae being checked -- continue fluconazole for now She needs 2 weeks IV fluconazole from 1/04-1/18/2023 Will follow up with her in id clinic 1/31 @ 1130 AM  @  [facility] [address], [place],  [address] Phone: [phone]   Principal Problem:   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[place]) Active Problems:   Hypertension   AKI (acute kidney injury) ([place])   Crohn's disease ([place])   Short gut syndrome   Vitamin B12 deficiency   Depression   Chronic pain syndrome   LFTs abnormal   Abdominal pain   Chronic kidney disease, stage 3b ([place])   Anemia   Candidemia ([place])   Seizures ([place])   Sepsis ([place])   PICC (peripherally inserted central catheter) in place   Pancreatic insufficiency   Allergies  Allergen Reactions   Meperidine Hives    Other reaction(s): GI Upset Due to [place]    Hyoscyamine Hives and Swelling    Legs swelling Disorientation   Cefepime Other (See Comments)    Neurotoxicity occurring in setting of AKI. Ceftriaxone tolerated during same admit   Gabapentin Other (See Comments)    unknown   Lyrica [Pregabalin] Other (See Comments)  Has chronic dehydration and made it worse   Topamax [Topiramate] Other (See Comments)    Has chronic dehydration and made it worse   Zosyn [Piperacillin Sod-Tazobactam So] Other (See Comments)    Patient reports it makes her vomit, her neck stiff, and her "heart feel funny" Affected kidneys   Fentanyl Rash    Pt is allergic to fentanyl patch related to the glue (gives her a rash) Pt states she is NOT allergic to fentanyl IV medicine   Morphine And Related Rash    Scheduled Meds:  amLODipine  10 mg Oral Daily   buPROPion ER  200 mg Oral Daily   calcium carbonate  1,250 mg Oral Q breakfast   carvedilol  12.5 mg Oral BID WC   Chlorhexidine Gluconate  Cloth  6 each Topical Daily   cholecalciferol  1,000 Units Oral Daily   cycloSPORINE  1 drop Both Eyes BID   DULoxetine  120 mg Oral Daily   estradiol  2 mg Oral Daily   feeding supplement  1 Container Oral TID BM   heparin injection (subcutaneous)  5,000 Units Subcutaneous Q8H   hydrALAZINE  100 mg Oral TID   HYDROmorphone  4 mg Oral QID   levETIRAcetam  500 mg Oral BID   lipase/protease/amylase  36,000 Units Oral TID with meals   methadone  5 mg Oral 5 X Daily   methocarbamol  500 mg Oral BID   multivitamin with minerals   Oral Daily   pantoprazole  40 mg Oral Daily   sodium chloride flush  10-40 mL Intracatheter Q12H   sucralfate  1 g Oral BID AC   Teduglutide (rDNA)  1.5 mg Subcutaneous Daily   vitamin B-12  1,000 mcg Oral Daily   Continuous Infusions:  sodium chloride 125 mL/hr at 11/29/21 2031   fluconazole (DIFLUCAN) IV     Followed by   Derrill Memo ON 12/01/2021] fluconazole (DIFLUCAN) IV     PRN Meds:.HYDROmorphone (DILAUDID) injection, ondansetron **OR** ondansetron (ZOFRAN) IV, polyvinyl alcohol   SUBJECTIVE: Doing well Endoscopy no active crohn's Improving gi sx Eating PO well Line removed 1/04 Repeat bcx in progress   Review of Systems: ROS All other ROS was negative, except mentioned above     OBJECTIVE: Vitals:   11/29/21 2138 11/30/21 0551 11/30/21 0840 11/30/21 1114  BP: (!) 141/66 (!) 149/62 (!) 159/68 (!) 164/66  Pulse: 73 63 60   Resp: 18 18    Temp: 98.5 F (36.9 C) 98.4 F (36.9 C)    TempSrc: Oral Oral    SpO2: 98% 98%    Weight:      Height:       Body mass index is 20.78 kg/m.  Physical Exam  General/constitutional: no distress, pleasant HEENT: Normocephalic, PER, Conj Clear, EOMI, Oropharynx clear Neck supple CV: rrr no mrg Lungs: clear to auscultation, normal respiratory effort Abd: Soft, Nontender Ext: no edema Skin: No Rash Neuro: nonfocal MSK: no peripheral joint swelling/tenderness/warmth; back spines  nontender   Central line presence: no -- previous right fem line site no fluctuance/tenderness  Lab Results Lab Results  Component Value Date   WBC 2.5 (L) 11/30/2021   HGB 7.5 (L) 11/30/2021   HCT 23.0 (L) 11/30/2021   MCV 96.2 11/30/2021   PLT 156 11/30/2021    Lab Results  Component Value Date   CREATININE 1.77 (H) 11/30/2021   BUN 20 11/30/2021   NA 138 11/30/2021   K 3.6 11/30/2021   CL 113 (H) 11/30/2021  CO2 20 (L) 11/30/2021    Lab Results  Component Value Date   ALT 156 (H) 11/30/2021   AST 32 11/30/2021   ALKPHOS 311 (H) 11/30/2021   BILITOT 0.7 11/30/2021      Microbiology: Recent Results (from the past 240 hour(s))  Blood culture (routine x 2)     Status: Abnormal (Preliminary result)   Collection Time: 11/27/21 12:20 PM   Specimen: BLOOD  Result Value Ref Range Status   Specimen Description   Final    BLOOD RIGHT ANTECUBITAL Performed at [facility] [address] [place], [place] [address]    Special Requests   Final    BOTTLES DRAWN AEROBIC AND ANAEROBIC Blood Culture adequate volume Performed at [facility] [address], [place], [place] [address]    Culture  Setup Time (A)  Final    YEAST AEROBIC BOTTLE ONLY CRITICAL RESULT CALLED TO, READ BACK BY AND VERIFIED WITH: PHARM D M.TUCKER ON [id number] BY E.PARRISH    Culture (A)  Final    CANDIDA LUSITANIAE Sent to [place] [place] for further susceptibility testing. Performed at [facility], [place] [address] [place], [place] [address]    Report Status PENDING  Incomplete  Blood Culture ID Panel (Reflexed)     Status: None   Collection Time: 11/27/21 12:20 PM  Result Value Ref Range Status   Enterococcus faecalis NOT DETECTED NOT DETECTED Final   Enterococcus Faecium NOT DETECTED NOT DETECTED Final   Listeria monocytogenes NOT DETECTED NOT DETECTED Final   Staphylococcus species NOT DETECTED NOT DETECTED Final   Staphylococcus aureus (BCID) NOT  DETECTED NOT DETECTED Final   Staphylococcus epidermidis NOT DETECTED NOT DETECTED Final   Staphylococcus lugdunensis NOT DETECTED NOT DETECTED Final   Streptococcus species NOT DETECTED NOT DETECTED Final   Streptococcus agalactiae NOT DETECTED NOT DETECTED Final   Streptococcus pneumoniae NOT DETECTED NOT DETECTED Final   Streptococcus pyogenes NOT DETECTED NOT DETECTED Final   A.calcoaceticus-baumannii NOT DETECTED NOT DETECTED Final   Bacteroides fragilis NOT DETECTED NOT DETECTED Final   Enterobacterales NOT DETECTED NOT DETECTED Final   Enterobacter cloacae complex NOT DETECTED NOT DETECTED Final   Escherichia coli NOT DETECTED NOT DETECTED Final   Klebsiella aerogenes NOT DETECTED NOT DETECTED Final   Klebsiella oxytoca NOT DETECTED NOT DETECTED Final   Klebsiella pneumoniae NOT DETECTED NOT DETECTED Final   Proteus species NOT DETECTED NOT DETECTED Final   Salmonella species NOT DETECTED NOT DETECTED Final   Serratia marcescens NOT DETECTED NOT DETECTED Final   Haemophilus influenzae NOT DETECTED NOT DETECTED Final   Neisseria meningitidis NOT DETECTED NOT DETECTED Final   Pseudomonas aeruginosa NOT DETECTED NOT DETECTED Final   Stenotrophomonas maltophilia NOT DETECTED NOT DETECTED Final   Candida albicans NOT DETECTED NOT DETECTED Final   Candida auris NOT DETECTED NOT DETECTED Final   Candida glabrata NOT DETECTED NOT DETECTED Final   Candida krusei NOT DETECTED NOT DETECTED Final   Candida parapsilosis NOT DETECTED NOT DETECTED Final   Candida tropicalis NOT DETECTED NOT DETECTED Final   Cryptococcus neoformans/gattii NOT DETECTED NOT DETECTED Final    Comment: Performed at [facility], [address] [place], [place] [address]  Blood culture (routine x 2)     Status: None (Preliminary result)   Collection Time: 11/27/21  9:26 PM   Specimen: BLOOD  Result Value Ref Range Status   Specimen Description   Final    BLOOD BLOOD LEFT HAND Performed at  [facility], [place] [address], [place], [place] [address]    Special Requests   Final    BOTTLES DRAWN AEROBIC ONLY Blood Culture adequate volume Performed at [facility] [address], [place], [place] [address]    Culture  Setup Time   Final    YEAST AEROBIC BOTTLE ONLY CRITICAL VALUE NOTED.  VALUE IS CONSISTENT WITH PREVIOUSLY REPORTED AND CALLED VALUE.    Culture   Final    YEAST IDENTIFICATION TO FOLLOW Performed at [facility], [place] [address] [place], [place] [address]    Report Status PENDING  Incomplete  Gastrointestinal Panel by PCR , Stool     Status: None   Collection Time: 11/28/21  1:30 AM   Specimen: STOOL  Result Value Ref Range Status   Campylobacter species NOT DETECTED NOT DETECTED Final   Plesimonas shigelloides NOT DETECTED NOT DETECTED Final   Salmonella species NOT DETECTED NOT DETECTED Final   Yersinia enterocolitica NOT DETECTED NOT DETECTED Final   Vibrio species NOT DETECTED NOT DETECTED Final   Vibrio cholerae NOT DETECTED NOT DETECTED Final   Enteroaggregative E coli (EAEC) NOT DETECTED NOT DETECTED Final   Enteropathogenic E coli (EPEC) NOT DETECTED NOT DETECTED Final   Enterotoxigenic E coli (ETEC) NOT DETECTED NOT DETECTED Final   Shiga like toxin producing E coli (STEC) NOT DETECTED NOT DETECTED Final   Shigella/Enteroinvasive E coli (EIEC) NOT DETECTED NOT DETECTED Final   Cryptosporidium NOT DETECTED NOT DETECTED Final   Cyclospora cayetanensis NOT DETECTED NOT DETECTED Final   Entamoeba histolytica NOT DETECTED NOT DETECTED Final   Giardia lamblia NOT DETECTED NOT DETECTED Final   Adenovirus F40/41 NOT DETECTED NOT DETECTED Final   Astrovirus NOT DETECTED NOT DETECTED Final   Norovirus GI/GII NOT DETECTED NOT DETECTED Final   Rotavirus A NOT DETECTED NOT DETECTED Final   Sapovirus (I, II, IV, and V) NOT DETECTED NOT DETECTED Final    Comment: Performed at [facility] [place]  [place], [place] [address]  C Difficile Quick Screen w PCR reflex     Status: None   Collection Time: 11/28/21  1:30 AM   Specimen: STOOL  Result Value Ref Range Status   C Diff antigen NEGATIVE NEGATIVE Final   C Diff toxin NEGATIVE NEGATIVE Final   C Diff interpretation No C. difficile detected.  Final    Comment: Performed at [facility], [place] [address], [place], [place] [address]  Culture, blood (routine x 2)     Status: None (Preliminary result)   Collection Time: 11/29/21  6:15 AM   Specimen: BLOOD RIGHT HAND  Result Value Ref Range Status   Specimen Description   Final    BLOOD RIGHT HAND Performed at [facility] [address], [place], [place] [address]    Special Requests   Final    BOTTLES DRAWN AEROBIC ONLY Blood Culture adequate volume Performed at [facility] [address], [place], [place] [address]    Culture   Final    NO GROWTH 1 DAY Performed at [facility], [place] [address], [place],  [address]    Report Status PENDING  Incomplete  Culture, blood (routine x 2)     Status: None (Preliminary result)   Collection Time: 11/29/21  6:16 AM   Specimen: BLOOD  Result Value Ref Range Status   Specimen Description   Final    BLOOD Performed at  [facility], [place] [address] [place], [place] [address]    Special Requests   Final    BOTTLES DRAWN AEROBIC ONLY Blood Culture results may not be optimal due to an inadequate volume of blood received in culture bottles Performed at [facility] [address], [place], [place] [address]    Culture   Final    NO GROWTH 1 DAY Performed at [facility], [place] [address], [place], [place] [address]    Report Status PENDING  Incomplete     Serology:   Imaging: If present, new imagings (plain films, ct scans, and mri) have been personally visualized and interpreted; radiology reports have been reviewed. Decision  making incorporated into the Impression / Recommendations.  1/4 tte  1. Left ventricular ejection fraction, by estimation, is 65 to 70%. Left  ventricular ejection fraction by 3D volume is 67 %. The left ventricle has  normal function. The left ventricle has no regional wall motion  abnormalities. There is moderate left  ventricular hypertrophy. Left ventricular diastolic parameters are  consistent with Grade I diastolic dysfunction (impaired relaxation). The  average left ventricular global longitudinal strain is -24.9 %. The global  longitudinal strain is normal.   2. Right ventricular systolic function is normal. The right ventricular  size is normal. There is normal pulmonary artery systolic pressure.   3. Left atrial size was severely dilated.   4. The mitral valve is grossly normal. Mild mitral valve regurgitation.   5. The aortic valve is tricuspid. There is mild calcification of the  aortic valve. There is mild thickening of the aortic valve. Aortic valve  regurgitation is not visualized. Aortic valve sclerosis is present, with  no evidence of aortic valve stenosis.   6. The inferior vena cava is dilated in size with >50% respiratory  variability, suggesting right atrial pressure of 8 mmHg.   Comparison(s): Compared to prior TTE in 06/2021, there is no significant  change.   Jabier Mutton, [name] for Infectious [place] [phone] pager    11/30/2021, 3:42 PM

## 2021-12-01 ENCOUNTER — Encounter (HOSPITAL_COMMUNITY): Payer: Self-pay | Admitting: Gastroenterology

## 2021-12-01 MED ORDER — SODIUM CHLORIDE 0.45 % IV SOLN: INTRAVENOUS | Status: DC

## 2021-12-01 NOTE — Care Management Important Message (Signed)
Important Message  Patient Details IM Letter given to the Patient Name: Caitlin Peters MRN: [id number] Date of Birth: December 18, 1960   Medicare Important Message Given:  Yes     [name] 12/01/2021, 11:35 AM

## 2021-12-01 NOTE — Progress Notes (Signed)
PROGRESS NOTE    Caitlin Peters  ZYS:[id number] DOB: [birth year] DOA: 11/27/2021 PCP: [name], [name], MD   Brief Narrative: Caitlin Peters is a 61 y.o. 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 Patient presented secondary to abdominal pain, vomiting and fever with concern for Crohn flare but found to have associated Candidemia. Eraxis started with transition to Fluconazole IV. TPN held and PICC line removed on 1/4. Colonoscopy on 1/4 negative for evidence of Crohn flare. Repeat blood cultures no growth to date. Plan for PICC replacement on 1/8 if blood cultures remain clear.   Assessment & Plan:   * 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[place]) Colonoscopy not consistent with Crohn flare. GI consulted. GI pathogen panel and C. Difficile ordered but no sample to date. Previously on budesonide but discontinued in 2022.  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 In setting of tunneled IV for chronic TPN. Initial blood cultures significant for Candida Lusitaniae. Patient empirically started on Eraxis. Infectious disease on board and patient transitioned to fluconazole 400 mg x 1 dose tapering to fluconazole 200 mg daily. Transthoracic Echocardiogram (1/4) obtained and negative for evidence of valvular vegetations. Repeat blood culture (1/4) with no growth to date. -Infectious disease recommendations (1/5): Continue fluconazole with plan for 2 weeks of treatment (1/4 - 1/18), outpatient ophthalmology followup, await susceptibilities, outpatient ID follow-up on 1/31 at 11:30 AM.  Sepsis ([place])- (present on admission) Present on admission. Secondary to Candidemia.  Anemia- (present on admission) Chronic issue. Hemoglobin drifted down. No overt bleeding  noted. Previous iron panel in 6/22 was normal, although ferritin was on the low end of normal. Associated normocytosis. Patient possibly with hemoconcentration on admission. Hemoglobin stable. -Transfuse for hemoglobin <7  LFTs abnormal- (present on admission) Chronic. 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  AKI (acute kidney injury) ([place])- (present on admission) Baseline creatinine of about 1.5-1.6. Creatinine of 2.29 no admission. Likely secondary to decreased oral intake and diarrhea with suspected Crohn flare. No hydronephrosis on CT imaging. Improving with IV fluids down to baseline. -Continue IV fluids while off of TPN; switch to 1/2 NS at 3/4 MIVF rate   PICC (peripherally inserted central catheter) in place Limited IV access. Initially with right femoral vein PICC line which was removed on 1/4 by IR secondary to candidemia.  Seizures ([place]) -Continue Keppra  Chronic kidney disease, stage 3b ([place])- (present on admission) Baseline creatinine 1.5-1.6  Abdominal pain- (present on admission) Secondary to colitis. Negative C. Difficile/GI pathogen panel testing. Colonoscopy performed on 1/4 without evidence of active Crohn. Abdominal pain is improved and patient currently has her chronic pain.  Chronic pain syndrome- (present on admission) -Continue home meds (methadone, dilaudid PO) with prn IV breakthrough  Depression- (present on admission) -Continue Wellbutrin and Cymbalta  Crohn's disease ([place]) Hx crohn's with prior resection. No active flare.  Hypertension- (present on admission) -Continue Amlodipine, Coreg and hydralazine  Bacteremia associated with intravascular line ([name])-resolved as of 11/29/2021, (present on admission) Patient treated on previous admission and completed antibiotic course on 11/27/21. At that time, blood culture positive for  stenotrophomonas maltophilia and pseudomonas putida. Repeat blood culture this admission shows treatment of bacteremia.  Pancreatic insufficiency -Continue Creon  Vitamin B12 deficiency- (present  on admission) -Continue B12 supplementation  Short gut syndrome- (present on admission) -Continue teduglutide    DVT prophylaxis: Heparin subq Code Status:   Code Status: Full Code Family Communication: None at bedside Disposition Plan: Discharge likely in 3-4 days pending cleared blood cultures, replacement of PICC for TPN   Consultants:  [facility] Infectious disease Interventional radiology  Procedures:  TRANSTHORACIC ECHOCARDIOGRAM (11/29/2021) IMPRESSIONS     1. Left ventricular ejection fraction, by estimation, is 65 to 70%. Left  ventricular ejection fraction by 3D volume is 67 %. The left ventricle has  normal function. The left ventricle has no regional wall motion  abnormalities. There is moderate left  ventricular hypertrophy. Left ventricular diastolic parameters are  consistent with Grade I diastolic dysfunction (impaired relaxation). The  average left ventricular global longitudinal strain is -24.9 %. The global  longitudinal strain is normal.   2. Right ventricular systolic function is normal. The right ventricular  size is normal. There is normal pulmonary artery systolic pressure.   3. Left atrial size was severely dilated.   4. The mitral valve is grossly normal. Mild mitral valve regurgitation.   5. The aortic valve is tricuspid. There is mild calcification of the  aortic valve. There is mild thickening of the aortic valve. Aortic valve  regurgitation is not visualized. Aortic valve sclerosis is present, with  no evidence of aortic valve stenosis.   6. The inferior vena cava is dilated in size with >50% respiratory  variability, suggesting right atrial pressure of 8 mmHg.   COLONOSCOPY (11/29/2021) Impression:               - Patent end-to-side  ileo-colonic anastomosis,                            characterized by healthy appearing mucosa.                           - The examined portion of the ileum was normal.                           - Tortuous colon.                           - Stool in the entire examined colon which was                            lavaged.                           - Internal hemorrhoids.                           - The examination was otherwise normal.                           No endoscopic evidence of any active Crohn's                            disease. In this light would not recommend any  steroids / immunosuppression for Crohn's disease.                            Unclear if functional symptoms at this point.                            Patient noted to have "intolerance" listed to                            Levsin, consider trial of Bentyl or IB gard.                            Patient overall improved from admission symptoms at                            this time. On antifungal regimen per ID for                            candidemia / recurrent line infection - management                            per ID, line to be changed.  Recommendation:           - Return patient to hospital ward for ongoing care.                           - Advance diet as tolerated.                           - Continue present medications.                           - No active Crohn's disease - advance diet as                            tolerated. No plans for steroids /                            immunosuppression at this time                           - Management of recurrent line infection /                            candidemia per ID                           - Patient can follow up with [facility] IBD clinic for                            short gut syndrome post discharge                           - GI signing off for now, please call with  questions moving  forward   Antimicrobials: Levaquin IV Eraxis Fluconazole IV    Subjective: Feels very good today. Feels better than on previous admissions.  Objective: Vitals:   11/30/21 1659 11/30/21 2142 12/01/21 0556 12/01/21 0946  BP: (!) 149/64 (!) 159/67 (!) 161/71 (!) 173/78  Pulse: 67 67 62 60  Resp:  16 16 17   Temp:  98.2 F (36.8 C) 98.1 F (36.7 C) 98.3 F (36.8 C)  TempSrc:  Oral Oral Oral  SpO2:  99% 96% 99%  Weight:      Height:        Intake/Output Summary (Last 24 hours) at 12/01/2021 1045 Last data filed at 12/01/2021 1505 Gross per 24 hour  Intake 200 ml  Output --  Net 200 ml    Filed Weights   11/27/21 1225 11/29/21 1234  Weight: 61.2 kg 62 kg    Examination:  General exam: Appears calm and comfortable Respiratory system: Clear to auscultation. Respiratory effort normal. Cardiovascular system: S1 & S2 heard, RRR. No murmurs, rubs, gallops or clicks. Gastrointestinal system: Abdomen is nondistended, soft and moderately-severely tender in left quadrant. Normal bowel sounds heard. Central nervous system: Alert and oriented. No focal neurological deficits. Musculoskeletal: No edema. No calf tenderness Skin: No cyanosis. No rashes Psychiatry: Judgement and insight appear normal. Mood & affect appropriate.     Data Reviewed: I have personally reviewed following labs and imaging studies  CBC Lab Results  Component Value Date   WBC 2.5 (L) 11/30/2021   RBC 2.39 (L) 11/30/2021   HGB 7.5 (L) 11/30/2021   HCT 23.0 (L) 11/30/2021   MCV 96.2 11/30/2021   MCH 31.4 11/30/2021   PLT 156 11/30/2021   MCHC 32.6 11/30/2021   RDW 13.4 11/30/2021   LYMPHSABS 0.3 (L) 10/08/2021   MONOABS 0.2 10/08/2021   EOSABS 0.0 10/08/2021   BASOSABS 0.0 69/79/4801     Last metabolic panel Lab Results  Component Value Date   NA 138 11/30/2021   K 3.6 11/30/2021   CL 113 (H) 11/30/2021   CO2 20 (L) 11/30/2021   BUN 20 11/30/2021   CREATININE 1.77 (H) 11/30/2021    GLUCOSE 128 (H) 11/30/2021   GFRNONAA 33 (L) 11/30/2021   GFRAA 55 (L) 08/12/2020   CALCIUM 8.7 (L) 11/30/2021   PHOS 3.9 11/28/2021   PROT 5.6 (L) 11/30/2021   ALBUMIN 2.3 (L) 11/30/2021   BILITOT 0.7 11/30/2021   ALKPHOS 311 (H) 11/30/2021   AST 32 11/30/2021   ALT 156 (H) 11/30/2021   ANIONGAP 5 11/30/2021    CBG (last 3)  No results for input(s): GLUCAP in the last 72 hours.   GFR: Estimated Creatinine Clearance: 33.1 mL/min (A) (by C-G formula based on SCr of 1.77 mg/dL (H)).  Coagulation Profile: No results for input(s): INR, PROTIME in the last 168 hours.  Recent Results (from the past 240 hour(s))  Blood culture (routine x 2)     Status: Abnormal (Preliminary result)   Collection Time: 11/27/21 12:20 PM   Specimen: BLOOD  Result Value Ref Range Status   Specimen Description   Final    BLOOD RIGHT ANTECUBITAL Performed at [facility] [address], [place], [place] [address]    Special Requests   Final    BOTTLES DRAWN AEROBIC AND ANAEROBIC Blood Culture adequate volume Performed at [facility] [address] [place], [place] [address]    Culture  Setup Time (A)  Final    YEAST AEROBIC BOTTLE  ONLY CRITICAL RESULT CALLED TO, READ BACK BY AND VERIFIED WITH: PHARM D M.TUCKER ON [id number] BY E.PARRISH    Culture (A)  Final    CANDIDA LUSITANIAE Sent to [facility] for further susceptibility testing. Performed at [facility], [place] [address], [place], [place] [address]    Report Status PENDING  Incomplete  Blood Culture ID Panel (Reflexed)     Status: None   Collection Time: 11/27/21 12:20 PM  Result Value Ref Range Status   Enterococcus faecalis NOT DETECTED NOT DETECTED Final   Enterococcus Faecium NOT DETECTED NOT DETECTED Final   Listeria monocytogenes NOT DETECTED NOT DETECTED Final   Staphylococcus species NOT DETECTED NOT DETECTED Final   Staphylococcus aureus (BCID) NOT DETECTED NOT DETECTED Final    Staphylococcus epidermidis NOT DETECTED NOT DETECTED Final   Staphylococcus lugdunensis NOT DETECTED NOT DETECTED Final   Streptococcus species NOT DETECTED NOT DETECTED Final   Streptococcus agalactiae NOT DETECTED NOT DETECTED Final   Streptococcus pneumoniae NOT DETECTED NOT DETECTED Final   Streptococcus pyogenes NOT DETECTED NOT DETECTED Final   A.calcoaceticus-baumannii NOT DETECTED NOT DETECTED Final   Bacteroides fragilis NOT DETECTED NOT DETECTED Final   Enterobacterales NOT DETECTED NOT DETECTED Final   Enterobacter cloacae complex NOT DETECTED NOT DETECTED Final   Escherichia coli NOT DETECTED NOT DETECTED Final   Klebsiella aerogenes NOT DETECTED NOT DETECTED Final   Klebsiella oxytoca NOT DETECTED NOT DETECTED Final   Klebsiella pneumoniae NOT DETECTED NOT DETECTED Final   Proteus species NOT DETECTED NOT DETECTED Final   Salmonella species NOT DETECTED NOT DETECTED Final   Serratia marcescens NOT DETECTED NOT DETECTED Final   Haemophilus influenzae NOT DETECTED NOT DETECTED Final   Neisseria meningitidis NOT DETECTED NOT DETECTED Final   Pseudomonas aeruginosa NOT DETECTED NOT DETECTED Final   Stenotrophomonas maltophilia NOT DETECTED NOT DETECTED Final   Candida albicans NOT DETECTED NOT DETECTED Final   Candida auris NOT DETECTED NOT DETECTED Final   Candida glabrata NOT DETECTED NOT DETECTED Final   Candida krusei NOT DETECTED NOT DETECTED Final   Candida parapsilosis NOT DETECTED NOT DETECTED Final   Candida tropicalis NOT DETECTED NOT DETECTED Final   Cryptococcus neoformans/gattii NOT DETECTED NOT DETECTED Final    Comment: Performed at [facility], [address] [place], [place] [address]  Blood culture (routine x 2)     Status: Abnormal (Preliminary result)   Collection Time: 11/27/21  9:26 PM   Specimen: BLOOD  Result Value Ref Range Status   Specimen Description   Final    BLOOD BLOOD LEFT HAND Performed at [facility]  [address] [place], [place] [address]    Special Requests   Final    BOTTLES DRAWN AEROBIC ONLY Blood Culture adequate volume Performed at [facility] [address] [place], [place] [address]    Culture  Setup Time   Final    YEAST AEROBIC BOTTLE ONLY CRITICAL VALUE NOTED.  VALUE IS CONSISTENT WITH PREVIOUSLY REPORTED AND CALLED VALUE. Performed at [facility], [place] [address], [place], [place] [address]    Culture CANDIDA LUSITANIAE (A)  Final   Report Status PENDING  Incomplete  Gastrointestinal Panel by PCR , Stool     Status: None   Collection Time: 11/28/21  1:30 AM   Specimen: STOOL  Result Value Ref Range Status   Campylobacter species NOT DETECTED NOT DETECTED Final   Plesimonas shigelloides NOT DETECTED NOT DETECTED Final  Salmonella species NOT DETECTED NOT DETECTED Final   Yersinia enterocolitica NOT DETECTED NOT DETECTED Final   Vibrio species NOT DETECTED NOT DETECTED Final   Vibrio cholerae NOT DETECTED NOT DETECTED Final   Enteroaggregative E coli (EAEC) NOT DETECTED NOT DETECTED Final   Enteropathogenic E coli (EPEC) NOT DETECTED NOT DETECTED Final   Enterotoxigenic E coli (ETEC) NOT DETECTED NOT DETECTED Final   Shiga like toxin producing E coli (STEC) NOT DETECTED NOT DETECTED Final   Shigella/Enteroinvasive E coli (EIEC) NOT DETECTED NOT DETECTED Final   Cryptosporidium NOT DETECTED NOT DETECTED Final   Cyclospora cayetanensis NOT DETECTED NOT DETECTED Final   Entamoeba histolytica NOT DETECTED NOT DETECTED Final   Giardia lamblia NOT DETECTED NOT DETECTED Final   Adenovirus F40/41 NOT DETECTED NOT DETECTED Final   Astrovirus NOT DETECTED NOT DETECTED Final   Norovirus GI/GII NOT DETECTED NOT DETECTED Final   Rotavirus A NOT DETECTED NOT DETECTED Final   Sapovirus (I, II, IV, and V) NOT DETECTED NOT DETECTED Final    Comment: Performed at [facility], [address] [place], [place] [address]  C Difficile Quick Screen w  PCR reflex     Status: None   Collection Time: 11/28/21  1:30 AM   Specimen: STOOL  Result Value Ref Range Status   C Diff antigen NEGATIVE NEGATIVE Final   C Diff toxin NEGATIVE NEGATIVE Final   C Diff interpretation No C. difficile detected.  Final    Comment: Performed at [facility], [place] [address], [place], [place] [address]  Culture, blood (routine x 2)     Status: None (Preliminary result)   Collection Time: 11/29/21  6:15 AM   Specimen: BLOOD RIGHT HAND  Result Value Ref Range Status   Specimen Description   Final    BLOOD RIGHT HAND Performed at [facility] [address], [place], [place] [address]    Special Requests   Final    BOTTLES DRAWN AEROBIC ONLY Blood Culture adequate volume Performed at [facility] [address] [place], [place] [address]    Culture   Final    NO GROWTH 2 DAYS Performed at [facility] [address], [place], [place] [address]    Report Status PENDING  Incomplete  Culture, blood (routine x 2)     Status: None (Preliminary result)   Collection Time: 11/29/21  6:16 AM   Specimen: BLOOD  Result Value Ref Range Status   Specimen Description   Final    BLOOD Performed at [facility] [address] [place], [place] [address]    Special Requests   Final    BOTTLES DRAWN AEROBIC ONLY Blood Culture results may not be optimal due to an inadequate volume of blood received in culture bottles Performed at [facility] [address] [place], [place] [address]    Culture   Final    NO GROWTH 2 DAYS Performed at [facility] [address], [place], [place] [address]    Report Status PENDING  Incomplete        Radiology Studies: IR Removal Tun Cv Cath W/O FL  Result Date: 11/30/2021 CLINICAL DATA:  Long-term indwelling right femoral venous catheter for TPN. Multiple upper extremity central venous obstructions. Fungemia.  Catheter removal requested. EXAM: EXAM TUNNELED CENTRAL VENOUS CATHETER REMOVAL TECHNIQUE: The procedure, risks (including but not limited to bleeding, infection, organ damage ), benefits, and alternatives were explained  to the patient. Questions regarding the procedure were encouraged and answered. The patient understands and consents to the procedure. The dressing was removed. The 3 retention sutures were cut and removed in their entirety. The right femoral tunneled venous catheter was removed intact. Hemostasis was achieved. Site covered with sterile dressing. The patient tolerated the procedure well. COMPLICATIONS: COMPLICATIONS None immediate IMPRESSION: 1. Technically successful tunneled right femoral venous catheter removal. Electronically Signed   By: Lucrezia Europe M.D.   On: 11/30/2021 11:03        Scheduled Meds:  amLODipine  10 mg Oral Daily   buPROPion ER  200 mg Oral Daily   calcium carbonate  1,250 mg Oral Q breakfast   carvedilol  12.5 mg Oral BID WC   Chlorhexidine Gluconate Cloth  6 each Topical Daily   cholecalciferol  1,000 Units Oral Daily   cycloSPORINE  1 drop Both Eyes BID   DULoxetine  120 mg Oral Daily   estradiol  2 mg Oral Daily   feeding supplement  1 Container Oral TID BM   heparin injection (subcutaneous)  5,000 Units Subcutaneous Q8H   hydrALAZINE  100 mg Oral TID   HYDROmorphone  4 mg Oral QID   levETIRAcetam  500 mg Oral BID   lipase/protease/amylase  36,000 Units Oral TID with meals   methadone  5 mg Oral 5 X Daily   methocarbamol  500 mg Oral BID   multivitamin with minerals   Oral Daily   pantoprazole  40 mg Oral Daily   sodium chloride flush  10-40 mL Intracatheter Q12H   sucralfate  1 g Oral BID AC   Teduglutide (rDNA)  1.5 mg Subcutaneous Daily   vitamin B-12  1,000 mcg Oral Daily   Continuous Infusions:  sodium chloride 125 mL/hr at 12/01/21 0309   fluconazole (DIFLUCAN) IV       LOS: 4 days     Cordelia Poche, MD [facility] 12/01/2021, 10:45 AM  If 7PM-7AM, please contact night-coverage [email or web address]

## 2021-12-02 ENCOUNTER — Inpatient Hospital Stay (HOSPITAL_COMMUNITY): Payer: Medicare Other

## 2021-12-02 DIAGNOSIS — B377 Candidal sepsis: Secondary | ICD-10-CM | POA: Diagnosis not present

## 2021-12-02 DIAGNOSIS — G894 Chronic pain syndrome: Secondary | ICD-10-CM | POA: Diagnosis not present

## 2021-12-02 DIAGNOSIS — N179 Acute kidney failure, unspecified: Secondary | ICD-10-CM | POA: Diagnosis not present

## 2021-12-02 DIAGNOSIS — K50119 Crohn's disease of large intestine with unspecified complications: Secondary | ICD-10-CM | POA: Diagnosis not present

## 2021-12-02 MED ORDER — SODIUM CHLORIDE 0.9 % IV BOLUS
1000.0000 mL | Freq: Once | INTRAVENOUS | Status: AC
  Administered 2021-12-02: 1000 mL via INTRAVENOUS

## 2021-12-02 MED ORDER — PROMETHAZINE HCL 25 MG/ML IJ SOLN
12.5000 mg | Freq: Once | INTRAMUSCULAR | Status: AC
  Administered 2021-12-02: 12.5 mg via INTRAVENOUS
  Filled 2021-12-02: qty 12.5

## 2021-12-02 MED ORDER — DEXTROSE-NACL 5-0.45 % IV SOLN: INTRAVENOUS | Status: DC

## 2021-12-02 NOTE — Assessment & Plan Note (Addendum)
Occurring 1/6. Unsure of etiology. Associated abdominal pain but this is also a chronic issue. Patient vomited food from the whole day. Abdominal x-ray with possible ileus. Now appears resolved. -Zofran prn

## 2021-12-02 NOTE — Plan of Care (Signed)

## 2021-12-02 NOTE — Progress Notes (Signed)
PROGRESS NOTE    Caitlin Peters  OFB:[id number] DOB: 1961/03/02 DOA: 11/27/2021 PCP: [name], [name], MD   Brief Narrative: Caitlin Peters is a 61 y.o. 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 Patient presented secondary to abdominal pain, vomiting and fever with concern for Crohn flare but found to have associated Candidemia. Eraxis started with transition to Fluconazole IV. TPN held and PICC line removed on 1/4. Colonoscopy on 1/4 negative for evidence of Crohn flare. Repeat blood cultures no growth to date. Plan for PICC replacement on 1/8 if blood cultures remain clear.   Assessment & Plan:   * 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[name]) Colonoscopy not consistent with Crohn flare. GI consulted. GI pathogen panel and C. Difficile ordered but no sample to date. Previously on budesonide but discontinued in 2022.  Nausea & vomiting- (present on admission) Occurring 1/6. Unsure of etiology. Associated abdominal pain but this is also a chronic issue. Patient vomited food from the whole day. 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 In setting of tunneled IV for chronic TPN. Initial blood cultures significant for Candida Lusitaniae. Patient empirically started on Eraxis. Infectious disease on board and patient transitioned to fluconazole 400 mg x 1 dose tapering to fluconazole 200 mg daily. Transthoracic Echocardiogram (1/4) obtained and negative for evidence of valvular vegetations. Repeat blood culture (1/4) with no growth to date. -Infectious disease recommendations (1/5): Continue fluconazole with plan for 2 weeks of treatment (1/4 - 1/18), outpatient ophthalmology  followup, await susceptibilities, outpatient ID follow-up on 1/31 at 11:30 AM.  Sepsis ([name])- (present on admission) Present on admission. Secondary to Candidemia.  Anemia- (present on admission) Chronic issue. Hemoglobin drifted down. No overt bleeding noted. Previous iron panel in 6/22 was normal, although ferritin was on the low end of normal. Associated normocytosis. Patient possibly with hemoconcentration on admission. Hemoglobin stable. -Transfuse for hemoglobin <7  LFTs abnormal- (present on admission) Chronic. 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  AKI (acute kidney injury) ([place])- (present on admission) Baseline creatinine of about 1.5-1.6. Creatinine of 2.29 no admission. Likely secondary to decreased oral intake and diarrhea with suspected Crohn flare. No hydronephrosis on CT imaging. Improving with IV fluids down to baseline. -Continue IV fluids while off of TPN; switch to 1/2 NS at 3/4 MIVF rate   PICC (peripherally inserted central catheter) in place Limited IV access. Initially with right femoral vein PICC line which was removed on 1/4 by IR secondary to candidemia.  Seizures ([name]) -Continue Keppra  Chronic kidney disease, stage 3b ([name])- (present on admission) Baseline creatinine 1.5-1.6  Abdominal pain- (present on admission) Secondary to colitis. Negative C. Difficile/GI pathogen panel testing. Colonoscopy performed on 1/4 without evidence of active Crohn. Abdominal pain is improved and patient currently has her chronic pain.  Chronic pain syndrome- (present on admission) -Continue home meds (methadone, dilaudid PO) with prn IV breakthrough  Depression- (present on admission) -Continue Wellbutrin and Cymbalta  Crohn's disease ([name]) Hx crohn's with prior resection. No active flare.  Hypertension- (present on admission) -Continue  Amlodipine, Coreg and hydralazine  Bacteremia associated with intravascular line ([name])-resolved as of 11/29/2021, (present on admission)  Patient treated on previous admission and completed antibiotic course on 11/27/21. At that time, blood culture positive for stenotrophomonas maltophilia and pseudomonas putida. Repeat blood culture this admission shows treatment of bacteremia.  Pancreatic insufficiency -Continue Creon  Vitamin B12 deficiency- (present on admission) -Continue B12 supplementation  Short gut syndrome- (present on admission) -Continue teduglutide    DVT prophylaxis: Heparin subq Code Status:   Code Status: Full Code Family Communication: None at bedside Disposition Plan: Discharge likely in 3-4 days pending cleared blood cultures, replacement of PICC for TPN   Consultants:  [facility] Infectious disease Interventional radiology  Procedures:  TRANSTHORACIC ECHOCARDIOGRAM (11/29/2021) IMPRESSIONS     1. Left ventricular ejection fraction, by estimation, is 65 to 70%. Left  ventricular ejection fraction by 3D volume is 67 %. The left ventricle has  normal function. The left ventricle has no regional wall motion  abnormalities. There is moderate left  ventricular hypertrophy. Left ventricular diastolic parameters are  consistent with Grade I diastolic dysfunction (impaired relaxation). The  average left ventricular global longitudinal strain is -24.9 %. The global  longitudinal strain is normal.   2. Right ventricular systolic function is normal. The right ventricular  size is normal. There is normal pulmonary artery systolic pressure.   3. Left atrial size was severely dilated.   4. The mitral valve is grossly normal. Mild mitral valve regurgitation.   5. The aortic valve is tricuspid. There is mild calcification of the  aortic valve. There is mild thickening of the aortic valve. Aortic valve  regurgitation is not visualized. Aortic valve sclerosis is  present, with  no evidence of aortic valve stenosis.   6. The inferior vena cava is dilated in size with >50% respiratory  variability, suggesting right atrial pressure of 8 mmHg.   COLONOSCOPY (11/29/2021) Impression:               - Patent end-to-side ileo-colonic anastomosis,                            characterized by healthy appearing mucosa.                           - The examined portion of the ileum was normal.                           - Tortuous colon.                           - Stool in the entire examined colon which was                            lavaged.                           - Internal hemorrhoids.                           - The examination was otherwise normal.                           No endoscopic evidence of any active Crohn's  disease. In this light would not recommend any                            steroids / immunosuppression for Crohn's disease.                            Unclear if functional symptoms at this point.                            Patient noted to have "intolerance" listed to                            Levsin, consider trial of Bentyl or IB gard.                            Patient overall improved from admission symptoms at                            this time. On antifungal regimen per ID for                            candidemia / recurrent line infection - management                            per ID, line to be changed.  Recommendation:           - Return patient to hospital ward for ongoing care.                           - Advance diet as tolerated.                           - Continue present medications.                           - No active Crohn's disease - advance diet as                            tolerated. No plans for steroids /                            immunosuppression at this time                           - Management of recurrent line infection /                            candidemia per ID                            - Patient can follow up with [facility] IBD clinic for                            short gut syndrome post discharge                           -  GI signing off for now, please call with                            questions moving forward   Antimicrobials: Levaquin IV Eraxis Fluconazole IV    Subjective: Rough night. Emesis overnight consisting of food from the day. Having bowel movements and abdominal pain is not worsened. Still nauseated this morning.  Objective: Vitals:   12/01/21 1446 12/01/21 2052 12/02/21 0633 12/02/21 1214  BP: (!) 149/77 140/71 (!) 174/70 (!) 179/73  Pulse: 61 62 63 66  Resp: 18 17  16   Temp: (!) 97.2 F (36.2 C) 97.9 F (36.6 C) 97.6 F (36.4 C) 98.5 F (36.9 C)  TempSrc: Oral Oral Oral Oral  SpO2: 99% 98% 100% 100%  Weight:      Height:        Intake/Output Summary (Last 24 hours) at 12/02/2021 1221 Last data filed at 12/02/2021 1133 Gross per 24 hour  Intake 2402.87 ml  Output 200 ml  Net 2202.87 ml    Filed Weights   11/27/21 1225 11/29/21 1234  Weight: 61.2 kg 62 kg    Examination:  General exam: Appears calm Respiratory system: Clear to auscultation. Respiratory effort normal. Cardiovascular system: S1 & S2 heard, RRR. No murmurs, rubs, gallops or clicks. Gastrointestinal system: Abdomen is nondistended, soft and tender. Decreased bowel sounds heard. Central nervous system: Alert and oriented. No focal neurological deficits. Musculoskeletal: No edema. No calf tenderness Skin: No cyanosis. No rashes Psychiatry: Judgement and insight appear normal. Mood & affect appropriate.     Data Reviewed: I have personally reviewed following labs and imaging studies  CBC Lab Results  Component Value Date   WBC 2.5 (L) 11/30/2021   RBC 2.39 (L) 11/30/2021   HGB 7.5 (L) 11/30/2021   HCT 23.0 (L) 11/30/2021   MCV 96.2 11/30/2021   MCH 31.4 11/30/2021   PLT 156 11/30/2021   MCHC 32.6 11/30/2021   RDW 13.4 11/30/2021    LYMPHSABS 0.3 (L) 10/08/2021   MONOABS 0.2 10/08/2021   EOSABS 0.0 10/08/2021   BASOSABS 0.0 77/93/9030     Last metabolic panel Lab Results  Component Value Date   NA 138 11/30/2021   K 3.6 11/30/2021   CL 113 (H) 11/30/2021   CO2 20 (L) 11/30/2021   BUN 20 11/30/2021   CREATININE 1.77 (H) 11/30/2021   GLUCOSE 128 (H) 11/30/2021   GFRNONAA 33 (L) 11/30/2021   GFRAA 55 (L) 08/12/2020   CALCIUM 8.7 (L) 11/30/2021   PHOS 3.9 11/28/2021   PROT 5.6 (L) 11/30/2021   ALBUMIN 2.3 (L) 11/30/2021   BILITOT 0.7 11/30/2021   ALKPHOS 311 (H) 11/30/2021   AST 32 11/30/2021   ALT 156 (H) 11/30/2021   ANIONGAP 5 11/30/2021    CBG (last 3)  No results for input(s): GLUCAP in the last 72 hours.   GFR: Estimated Creatinine Clearance: 33.1 mL/min (A) (by C-G formula based on SCr of 1.77 mg/dL (H)).  Coagulation Profile: No results for input(s): INR, PROTIME in the last 168 hours.  Recent Results (from the past 240 hour(s))  Blood culture (routine x 2)     Status: Abnormal (Preliminary result)   Collection Time: 11/27/21 12:20 PM   Specimen: BLOOD  Result Value Ref Range Status   Specimen Description   Final    BLOOD RIGHT ANTECUBITAL Performed at [facility] [address] [place], [place] [address]    Special  Requests   Final    BOTTLES DRAWN AEROBIC AND ANAEROBIC Blood Culture adequate volume Performed at [facility] [address], [place], [place] [address]    Culture  Setup Time (A)  Final    YEAST AEROBIC BOTTLE ONLY CRITICAL RESULT CALLED TO, READ BACK BY AND VERIFIED WITH: PHARM D M.TUCKER ON [id number] BY E.PARRISH    Culture (A)  Final    CANDIDA LUSITANIAE Sent to [place] for further susceptibility testing. Performed at [facility], [place] [address], [place], [place] [address]    Report Status PENDING  Incomplete  Blood Culture ID Panel (Reflexed)     Status: None   Collection Time: 11/27/21 12:20 PM  Result Value  Ref Range Status   Enterococcus faecalis NOT DETECTED NOT DETECTED Final   Enterococcus Faecium NOT DETECTED NOT DETECTED Final   Listeria monocytogenes NOT DETECTED NOT DETECTED Final   Staphylococcus species NOT DETECTED NOT DETECTED Final   Staphylococcus aureus (BCID) NOT DETECTED NOT DETECTED Final   Staphylococcus epidermidis NOT DETECTED NOT DETECTED Final   Staphylococcus lugdunensis NOT DETECTED NOT DETECTED Final   Streptococcus species NOT DETECTED NOT DETECTED Final   Streptococcus agalactiae NOT DETECTED NOT DETECTED Final   Streptococcus pneumoniae NOT DETECTED NOT DETECTED Final   Streptococcus pyogenes NOT DETECTED NOT DETECTED Final   A.calcoaceticus-baumannii NOT DETECTED NOT DETECTED Final   Bacteroides fragilis NOT DETECTED NOT DETECTED Final   Enterobacterales NOT DETECTED NOT DETECTED Final   Enterobacter cloacae complex NOT DETECTED NOT DETECTED Final   Escherichia coli NOT DETECTED NOT DETECTED Final   Klebsiella aerogenes NOT DETECTED NOT DETECTED Final   Klebsiella oxytoca NOT DETECTED NOT DETECTED Final   Klebsiella pneumoniae NOT DETECTED NOT DETECTED Final   Proteus species NOT DETECTED NOT DETECTED Final   Salmonella species NOT DETECTED NOT DETECTED Final   Serratia marcescens NOT DETECTED NOT DETECTED Final   Haemophilus influenzae NOT DETECTED NOT DETECTED Final   Neisseria meningitidis NOT DETECTED NOT DETECTED Final   Pseudomonas aeruginosa NOT DETECTED NOT DETECTED Final   Stenotrophomonas maltophilia NOT DETECTED NOT DETECTED Final   Candida albicans NOT DETECTED NOT DETECTED Final   Candida auris NOT DETECTED NOT DETECTED Final   Candida glabrata NOT DETECTED NOT DETECTED Final   Candida krusei NOT DETECTED NOT DETECTED Final   Candida parapsilosis NOT DETECTED NOT DETECTED Final   Candida tropicalis NOT DETECTED NOT DETECTED Final   Cryptococcus neoformans/gattii NOT DETECTED NOT DETECTED Final    Comment: Performed at [facility],  [address], [place], [place] [address]  Antifungal AST 9 Drug Panel     Status: None (Preliminary result)   Collection Time: 11/27/21 12:20 PM  Result Value Ref Range Status   Organism ID, Yeast Preliminary report  Final    Comment: (NOTE) Specimen has been received and testing has been initiated. Performed At: [facility] [place], [place] [id number] Rush Farmer MD FG:[id number]    Amphotericin B MIC PENDING  Incomplete   Anidulafungin MIC PENDING  Incomplete   Caspofungin MIC PENDING  Incomplete   Micafungin MIC PENDING  Incomplete   Posaconazole MIC PENDING  Incomplete   Fluconazole Islt MIC PENDING  Incomplete   Flucytosine MIC PENDING  Incomplete   Itraconazole MIC PENDING  Incomplete   Voriconazole MIC PENDING  Incomplete   Source   Final    [id number] C.LUSITANIAE 9 DRUGS PANEL ANTIFUNGAL BLOOD CULTURE    Comment: Performed at [facility], [place] [address], [place], [place] [address]  Blood culture (routine x 2)     Status: Abnormal (Preliminary result)   Collection Time: 11/27/21  9:26 PM   Specimen: BLOOD  Result Value Ref Range Status   Specimen Description   Final    BLOOD BLOOD LEFT HAND Performed at [facility] [address], [place], [place] [address]    Special Requests   Final    BOTTLES DRAWN AEROBIC ONLY Blood Culture adequate volume Performed at [facility] [address], [place], [place] [address]    Culture  Setup Time   Final    YEAST AEROBIC BOTTLE ONLY CRITICAL VALUE NOTED.  VALUE IS CONSISTENT WITH PREVIOUSLY REPORTED AND CALLED VALUE. Performed at [facility], [place] [address] [place], [place] [address]    Culture CANDIDA LUSITANIAE (A)  Final   Report Status PENDING  Incomplete  Gastrointestinal Panel by PCR , Stool     Status: None   Collection Time: 11/28/21  1:30 AM   Specimen: STOOL  Result Value Ref Range Status   Campylobacter species NOT DETECTED NOT DETECTED  Final   Plesimonas shigelloides NOT DETECTED NOT DETECTED Final   Salmonella species NOT DETECTED NOT DETECTED Final   Yersinia enterocolitica NOT DETECTED NOT DETECTED Final   Vibrio species NOT DETECTED NOT DETECTED Final   Vibrio cholerae NOT DETECTED NOT DETECTED Final   Enteroaggregative E coli (EAEC) NOT DETECTED NOT DETECTED Final   Enteropathogenic E coli (EPEC) NOT DETECTED NOT DETECTED Final   Enterotoxigenic E coli (ETEC) NOT DETECTED NOT DETECTED Final   Shiga like toxin producing E coli (STEC) NOT DETECTED NOT DETECTED Final   Shigella/Enteroinvasive E coli (EIEC) NOT DETECTED NOT DETECTED Final   Cryptosporidium NOT DETECTED NOT DETECTED Final   Cyclospora cayetanensis NOT DETECTED NOT DETECTED Final   Entamoeba histolytica NOT DETECTED NOT DETECTED Final   Giardia lamblia NOT DETECTED NOT DETECTED Final   Adenovirus F40/41 NOT DETECTED NOT DETECTED Final   Astrovirus NOT DETECTED NOT DETECTED Final   Norovirus GI/GII NOT DETECTED NOT DETECTED Final   Rotavirus A NOT DETECTED NOT DETECTED Final   Sapovirus (I, II, IV, and V) NOT DETECTED NOT DETECTED Final    Comment: Performed at [facility], [place] [place], [place] [address]  C Difficile Quick Screen w PCR reflex     Status: None   Collection Time: 11/28/21  1:30 AM   Specimen: STOOL  Result Value Ref Range Status   C Diff antigen NEGATIVE NEGATIVE Final   C Diff toxin NEGATIVE NEGATIVE Final   C Diff interpretation No C. difficile detected.  Final    Comment: Performed at [facility], [place] [address] [place], [place] [address]  Culture, blood (routine x 2)     Status: None (Preliminary result)   Collection Time: 11/29/21  6:15 AM   Specimen: BLOOD RIGHT HAND  Result Value Ref Range Status   Specimen Description   Final    BLOOD RIGHT HAND Performed at [facility] [address], [place], [place] [address]    Special Requests   Final    BOTTLES DRAWN  AEROBIC ONLY Blood Culture adequate volume Performed at [facility] [address], [place], [place] [address]    Culture   Final    NO GROWTH 3 DAYS Performed at [facility], [place] [address] [place], [place] [address]  Report Status PENDING  Incomplete  Culture, blood (routine x 2)     Status: None (Preliminary result)   Collection Time: 11/29/21  6:16 AM   Specimen: BLOOD  Result Value Ref Range Status   Specimen Description   Final    BLOOD Performed at [facility] [address] [place] [address], [place], [place] [address]    Special Requests   Final    BOTTLES DRAWN AEROBIC ONLY Blood Culture results may not be optimal due to an inadequate volume of blood received in culture bottles Performed at [facility] [address], [place], [place] [address]    Culture   Final    NO GROWTH 3 DAYS Performed at [facility], [place] [address] [place], [place] [address]    Report Status PENDING  Incomplete        Radiology Studies: No results found.      Scheduled Meds:  amLODipine  10 mg Oral Daily   buPROPion ER  200 mg Oral Daily   calcium carbonate  1,250 mg Oral Q breakfast   carvedilol  12.5 mg Oral BID WC   Chlorhexidine Gluconate Cloth  6 each Topical Daily   cholecalciferol  1,000 Units Oral Daily   cycloSPORINE  1 drop Both Eyes BID   DULoxetine  120 mg Oral Daily   estradiol  2 mg Oral Daily   feeding supplement  1 Container Oral TID BM   heparin injection (subcutaneous)  5,000 Units Subcutaneous Q8H   hydrALAZINE  100 mg Oral TID   HYDROmorphone  4 mg Oral QID   levETIRAcetam  500 mg Oral BID   lipase/protease/amylase  36,000 Units Oral TID with meals   methadone  5 mg Oral 5 X Daily   methocarbamol  500 mg Oral BID   multivitamin with minerals   Oral Daily   pantoprazole  40 mg Oral Daily   sodium chloride flush  10-40 mL Intracatheter Q12H   sucralfate  1 g Oral BID AC   Teduglutide (rDNA)   1.5 mg Subcutaneous Daily   vitamin B-12  1,000 mcg Oral Daily   Continuous Infusions:  dextrose 5 % and 0.45% NaCl     fluconazole (DIFLUCAN) IV 100 mL/hr at 12/02/21 1133     LOS: 5 days     Cordelia Poche, MD [facility] 12/02/2021, 12:21 PM  If 7PM-7AM, please contact night-coverage [email or web address]

## 2021-12-02 NOTE — Plan of Care (Signed)

## 2021-12-02 NOTE — Plan of Care (Signed)
°  Problem: Education: Goal: Knowledge of General Education information will improve Description: Including pain rating scale, medication(s)/side effects and non-pharmacologic comfort measures Outcome: Progressing   Problem: Clinical Measurements: Goal: Ability to maintain clinical measurements within normal limits will improve Outcome: Progressing   Problem: Coping: Goal: Level of anxiety will decrease Outcome: Progressing   Problem: Pain Managment: Goal: General experience of comfort will improve Outcome: Progressing

## 2021-12-03 DIAGNOSIS — B377 Candidal sepsis: Secondary | ICD-10-CM | POA: Diagnosis not present

## 2021-12-03 DIAGNOSIS — G894 Chronic pain syndrome: Secondary | ICD-10-CM | POA: Diagnosis not present

## 2021-12-03 DIAGNOSIS — N179 Acute kidney failure, unspecified: Secondary | ICD-10-CM | POA: Diagnosis not present

## 2021-12-03 DIAGNOSIS — K50119 Crohn's disease of large intestine with unspecified complications: Secondary | ICD-10-CM | POA: Diagnosis not present

## 2021-12-03 LAB — CBC
HCT: 23 % — ABNORMAL LOW (ref 36.0–46.0)
Hemoglobin: 7.5 g/dL — ABNORMAL LOW (ref 12.0–15.0)
MCH: 30.5 pg (ref 26.0–34.0)
MCHC: 32.6 g/dL (ref 30.0–36.0)
MCV: 93.5 fL (ref 80.0–100.0)
Platelets: 209 10*3/uL (ref 150–400)
RBC: 2.46 MIL/uL — ABNORMAL LOW (ref 3.87–5.11)
RDW: 13.1 % (ref 11.5–15.5)
WBC: 3.1 10*3/uL — ABNORMAL LOW (ref 4.0–10.5)
nRBC: 0 % (ref 0.0–0.2)

## 2021-12-03 LAB — BASIC METABOLIC PANEL
Anion gap: 9 (ref 5–15)
BUN: 15 mg/dL (ref 6–20)
CO2: 20 mmol/L — ABNORMAL LOW (ref 22–32)
Calcium: 9.1 mg/dL (ref 8.9–10.3)
Chloride: 108 mmol/L (ref 98–111)
Creatinine, Ser: 1.6 mg/dL — ABNORMAL HIGH (ref 0.44–1.00)
GFR, Estimated: 37 mL/min — ABNORMAL LOW (ref >60)
Glucose, Bld: 90 mg/dL (ref 70–99)
Potassium: 3.3 mmol/L — ABNORMAL LOW (ref 3.5–5.1)
Sodium: 137 mmol/L (ref 135–145)

## 2021-12-03 NOTE — Progress Notes (Signed)
PROGRESS NOTE    [name]  MWU:[id number] DOB: [birth year] DOA: 11/27/2021 PCP: [name], [name], MD   Brief Narrative: Caitlin ARMIJO is a 61 y.o. 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 Patient presented secondary to abdominal pain, vomiting and fever with concern for Crohn flare but found to have associated Candidemia. Eraxis started with transition to Fluconazole IV. TPN held and PICC line removed on 1/4. Colonoscopy on 1/4 negative for evidence of Crohn flare. Repeat blood cultures no growth to date. Plan for PICC replacement on 1/8 if blood cultures remain clear.   Assessment & Plan:   * 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[place]) Colonoscopy not consistent with Crohn flare. GI consulted. GI pathogen panel and C. Difficile ordered but no sample to date. Previously on budesonide but discontinued in 2022.  Nausea & vomiting- (present on admission) Occurring 1/6. Unsure of etiology. Associated abdominal pain but this is also a chronic issue. Patient vomited food from the whole day. Abdominal x-ray with possible ileus. Now appears resolved. 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 In setting of tunneled IV for chronic TPN. Initial blood cultures significant for Candida Lusitaniae. Patient empirically started on Eraxis. Infectious disease on board and patient transitioned to fluconazole 400 mg x 1 dose tapering to fluconazole 200 mg daily. Transthoracic Echocardiogram (1/4) obtained and negative for evidence of valvular vegetations. Repeat blood culture (1/4) with no growth to date. -Infectious disease recommendations (1/5): Continue fluconazole with plan for 2 weeks of treatment (1/4 - 1/18), outpatient ophthalmology followup,  await susceptibilities, outpatient ID follow-up on 1/31 at 11:30 AM.  Sepsis ([place])- (present on admission) Present on admission. Secondary to Candidemia.  Anemia- (present on admission) Chronic issue. Hemoglobin drifted down. No overt bleeding noted. Previous iron panel in 6/22 was normal, although ferritin was on the low end of normal. Associated normocytosis. Patient possibly with hemoconcentration on admission. Hemoglobin stable. -Transfuse for hemoglobin <7  LFTs abnormal- (present on admission) Chronic. 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  AKI (acute kidney injury) ([place])- (present on admission) Baseline creatinine of about 1.5-1.6. Creatinine of 2.29 no admission. Likely secondary to decreased oral intake and diarrhea with suspected Crohn flare. No hydronephrosis on CT imaging. Improving with IV fluids down to baseline. -Continue IV fluids while off of TPN; switch to 1/2 NS at 3/4 MIVF rate   PICC (peripherally inserted central catheter) in place Limited IV access. Initially with right femoral vein PICC line which was removed on 1/4 by IR secondary to candidemia.  Seizures ([place]) -Continue Keppra  Chronic kidney disease, stage 3b ([place])- (present on admission) Baseline creatinine 1.5-1.6  Abdominal pain- (present on admission) Secondary to colitis. Negative C. Difficile/GI pathogen panel testing. Colonoscopy performed on 1/4 without evidence of active Crohn. Abdominal pain is improved and patient currently has her chronic pain.  Chronic pain syndrome- (present on admission) -Continue home meds (methadone, dilaudid PO) with prn IV breakthrough  Depression- (present on admission) -Continue Wellbutrin and Cymbalta  Crohn's disease ([place]) Hx crohn's with prior resection. No active flare.  Hypertension- (present on admission) -Continue Amlodipine,  Coreg and hydralazine  Bacteremia associated with intravascular line ([name])-resolved as of 11/29/2021, (present on admission) Patient treated on  previous admission and completed antibiotic course on 11/27/21. At that time, blood culture positive for stenotrophomonas maltophilia and pseudomonas putida. Repeat blood culture this admission shows treatment of bacteremia.  Pancreatic insufficiency -Continue Creon  Vitamin B12 deficiency- (present on admission) -Continue B12 supplementation  Short gut syndrome- (present on admission) -Continue teduglutide -TPN held secondary to line holiday    DVT prophylaxis: Heparin subq Code Status:   Code Status: Full Code Family Communication: None at bedside Disposition Plan: Discharge likely in 1-2 days pending cleared blood cultures, replacement of PICC for TPN   Consultants:  [facility] Infectious disease Interventional radiology  Procedures:  TRANSTHORACIC ECHOCARDIOGRAM (11/29/2021) IMPRESSIONS     1. Left ventricular ejection fraction, by estimation, is 65 to 70%. Left  ventricular ejection fraction by 3D volume is 67 %. The left ventricle has  normal function. The left ventricle has no regional wall motion  abnormalities. There is moderate left  ventricular hypertrophy. Left ventricular diastolic parameters are  consistent with Grade I diastolic dysfunction (impaired relaxation). The  average left ventricular global longitudinal strain is -24.9 %. The global  longitudinal strain is normal.   2. Right ventricular systolic function is normal. The right ventricular  size is normal. There is normal pulmonary artery systolic pressure.   3. Left atrial size was severely dilated.   4. The mitral valve is grossly normal. Mild mitral valve regurgitation.   5. The aortic valve is tricuspid. There is mild calcification of the  aortic valve. There is mild thickening of the aortic valve. Aortic valve  regurgitation is not visualized.  Aortic valve sclerosis is present, with  no evidence of aortic valve stenosis.   6. The inferior vena cava is dilated in size with >50% respiratory  variability, suggesting right atrial pressure of 8 mmHg.   COLONOSCOPY (11/29/2021) Impression:               - Patent end-to-side ileo-colonic anastomosis,                            characterized by healthy appearing mucosa.                           - The examined portion of the ileum was normal.                           - Tortuous colon.                           - Stool in the entire examined colon which was                            lavaged.                           - Internal hemorrhoids.                           - The examination was otherwise normal.                           No endoscopic evidence of any active Crohn's  disease. In this light would not recommend any                            steroids / immunosuppression for Crohn's disease.                            Unclear if functional symptoms at this point.                            Patient noted to have "intolerance" listed to                            Levsin, consider trial of Bentyl or IB gard.                            Patient overall improved from admission symptoms at                            this time. On antifungal regimen per ID for                            candidemia / recurrent line infection - management                            per ID, line to be changed.  Recommendation:           - Return patient to hospital ward for ongoing care.                           - Advance diet as tolerated.                           - Continue present medications.                           - No active Crohn's disease - advance diet as                            tolerated. No plans for steroids /                            immunosuppression at this time                           - Management of recurrent line infection /                             candidemia per ID                           - Patient can follow up with [facility] for                            short gut syndrome post discharge                           -  GI signing off for now, please call with                            questions moving forward   Antimicrobials: Levaquin IV Eraxis Fluconazole IV    Subjective: Feeling much better today. No nausea or vomiting but has had some burping. No other issues.  Objective: Vitals:   12/02/21 1556 12/02/21 2126 12/03/21 0524 12/03/21 0916  BP: (!) 141/63 132/67 139/66 (!) 150/68  Pulse: 75 69 70 66  Resp: 15 17 20 16   Temp: 99.6 F (37.6 C) 98.8 F (37.1 C) 98.8 F (37.1 C) 98.7 F (37.1 C)  TempSrc: Oral Oral  Oral  SpO2: 99% 98% 99% 100%  Weight:      Height:        Intake/Output Summary (Last 24 hours) at 12/03/2021 1435 Last data filed at 12/03/2021 0951 Gross per 24 hour  Intake 1725.84 ml  Output 400 ml  Net 1325.84 ml    Filed Weights   11/27/21 1225 11/29/21 1234  Weight: 61.2 kg 62 kg    Examination:  General exam: Appears calm and comfortable Respiratory system: Clear to auscultation. Respiratory effort normal. Cardiovascular system: S1 & S2 heard, RRR. Gastrointestinal system: Abdomen is nondistended, soft and nontender. No organomegaly or masses felt. Normal bowel sounds heard. Central nervous system: Alert and oriented. No focal neurological deficits. Musculoskeletal: No edema. No calf tenderness Skin: No cyanosis. No rashes Psychiatry: Judgement and insight appear normal. Mood & affect appropriate.     Data Reviewed: I have personally reviewed following labs and imaging studies  CBC Lab Results  Component Value Date   WBC 3.1 (L) 12/03/2021   RBC 2.46 (L) 12/03/2021   HGB 7.5 (L) 12/03/2021   HCT 23.0 (L) 12/03/2021   MCV 93.5 12/03/2021   MCH 30.5 12/03/2021   PLT 209 12/03/2021   MCHC 32.6 12/03/2021   RDW 13.1 12/03/2021   LYMPHSABS 0.3 (L) 10/08/2021   MONOABS  0.2 10/08/2021   EOSABS 0.0 10/08/2021   BASOSABS 0.0 43/32/9518     Last metabolic panel Lab Results  Component Value Date   NA 137 12/03/2021   K 3.3 (L) 12/03/2021   CL 108 12/03/2021   CO2 20 (L) 12/03/2021   BUN 15 12/03/2021   CREATININE 1.60 (H) 12/03/2021   GLUCOSE 90 12/03/2021   GFRNONAA 37 (L) 12/03/2021   GFRAA 55 (L) 08/12/2020   CALCIUM 9.1 12/03/2021   PHOS 3.9 11/28/2021   PROT 5.6 (L) 11/30/2021   ALBUMIN 2.3 (L) 11/30/2021   BILITOT 0.7 11/30/2021   ALKPHOS 311 (H) 11/30/2021   AST 32 11/30/2021   ALT 156 (H) 11/30/2021   ANIONGAP 9 12/03/2021    CBG (last 3)  No results for input(s): GLUCAP in the last 72 hours.   GFR: Estimated Creatinine Clearance: 36.6 mL/min (A) (by C-G formula based on SCr of 1.6 mg/dL (H)).  Coagulation Profile: No results for input(s): INR, PROTIME in the last 168 hours.  Recent Results (from the past 240 hour(s))  Blood culture (routine x 2)     Status: Abnormal (Preliminary result)   Collection Time: 11/27/21 12:20 PM   Specimen: BLOOD  Result Value Ref Range Status   Specimen Description   Final    BLOOD RIGHT ANTECUBITAL Performed at [facility] [address] [place], [place] [address]    Special Requests   Final    BOTTLES DRAWN AEROBIC  AND ANAEROBIC Blood Culture adequate volume Performed at [facility] [address] [place], [place] [address]    Culture  Setup Time (A)  Final    YEAST AEROBIC BOTTLE ONLY CRITICAL RESULT CALLED TO, READ BACK BY AND VERIFIED WITH: PHARM D M.TUCKER ON [id number] BY E.PARRISH    Culture (A)  Final    CANDIDA LUSITANIAE Sent to [facility] for further susceptibility testing. Performed at [facility], [place] [address] [place], [place] [address]    Report Status PENDING  Incomplete  Blood Culture ID Panel (Reflexed)     Status: None   Collection Time: 11/27/21 12:20 PM  Result Value Ref Range Status   Enterococcus faecalis NOT  DETECTED NOT DETECTED Final   Enterococcus Faecium NOT DETECTED NOT DETECTED Final   Listeria monocytogenes NOT DETECTED NOT DETECTED Final   Staphylococcus species NOT DETECTED NOT DETECTED Final   Staphylococcus aureus (BCID) NOT DETECTED NOT DETECTED Final   Staphylococcus epidermidis NOT DETECTED NOT DETECTED Final   Staphylococcus lugdunensis NOT DETECTED NOT DETECTED Final   Streptococcus species NOT DETECTED NOT DETECTED Final   Streptococcus agalactiae NOT DETECTED NOT DETECTED Final   Streptococcus pneumoniae NOT DETECTED NOT DETECTED Final   Streptococcus pyogenes NOT DETECTED NOT DETECTED Final   A.calcoaceticus-baumannii NOT DETECTED NOT DETECTED Final   Bacteroides fragilis NOT DETECTED NOT DETECTED Final   Enterobacterales NOT DETECTED NOT DETECTED Final   Enterobacter cloacae complex NOT DETECTED NOT DETECTED Final   Escherichia coli NOT DETECTED NOT DETECTED Final   Klebsiella aerogenes NOT DETECTED NOT DETECTED Final   Klebsiella oxytoca NOT DETECTED NOT DETECTED Final   Klebsiella pneumoniae NOT DETECTED NOT DETECTED Final   Proteus species NOT DETECTED NOT DETECTED Final   Salmonella species NOT DETECTED NOT DETECTED Final   Serratia marcescens NOT DETECTED NOT DETECTED Final   Haemophilus influenzae NOT DETECTED NOT DETECTED Final   Neisseria meningitidis NOT DETECTED NOT DETECTED Final   Pseudomonas aeruginosa NOT DETECTED NOT DETECTED Final   Stenotrophomonas maltophilia NOT DETECTED NOT DETECTED Final   Candida albicans NOT DETECTED NOT DETECTED Final   Candida auris NOT DETECTED NOT DETECTED Final   Candida glabrata NOT DETECTED NOT DETECTED Final   Candida krusei NOT DETECTED NOT DETECTED Final   Candida parapsilosis NOT DETECTED NOT DETECTED Final   Candida tropicalis NOT DETECTED NOT DETECTED Final   Cryptococcus neoformans/gattii NOT DETECTED NOT DETECTED Final    Comment: Performed at [facility], [address], [place], [place] [address]   Antifungal AST 9 Drug Panel     Status: None (Preliminary result)   Collection Time: 11/27/21 12:20 PM  Result Value Ref Range Status   Organism ID, Yeast Preliminary report  Final    Comment: (NOTE) Specimen has been received and testing has been initiated. Performed At: [facility] [place], [place] [id number] Rush Farmer MD [id number]    Amphotericin B MIC PENDING  Incomplete   Anidulafungin MIC PENDING  Incomplete   Caspofungin MIC PENDING  Incomplete   Micafungin MIC PENDING  Incomplete   Posaconazole MIC PENDING  Incomplete   Fluconazole Islt MIC PENDING  Incomplete   Flucytosine MIC PENDING  Incomplete   Itraconazole MIC PENDING  Incomplete   Voriconazole MIC PENDING  Incomplete   Source   Final    195093 C.LUSITANIAE 9 DRUGS PANEL ANTIFUNGAL BLOOD CULTURE    Comment: Performed at [facility], [place] [address] [place], [place] [address]  Blood culture (routine x 2)     Status: Abnormal (Preliminary result)   Collection Time: 11/27/21  9:26 PM   Specimen: BLOOD  Result Value Ref Range Status   Specimen Description   Final    BLOOD BLOOD LEFT HAND Performed at [facility] [address], [place], [place] [address]    Special Requests   Final    BOTTLES DRAWN AEROBIC ONLY Blood Culture adequate volume Performed at [facility] [address], [place], [place] [address]    Culture  Setup Time   Final    YEAST AEROBIC BOTTLE ONLY CRITICAL VALUE NOTED.  VALUE IS CONSISTENT WITH PREVIOUSLY REPORTED AND CALLED VALUE. Performed at [facility], [place] [address] [place], [place] [address]    Culture CANDIDA LUSITANIAE (A)  Final   Report Status PENDING  Incomplete  Gastrointestinal Panel by PCR , Stool     Status: None   Collection Time: 11/28/21  1:30 AM   Specimen: STOOL  Result Value Ref Range Status   Campylobacter species NOT DETECTED NOT DETECTED Final   Plesimonas shigelloides NOT  DETECTED NOT DETECTED Final   Salmonella species NOT DETECTED NOT DETECTED Final   Yersinia enterocolitica NOT DETECTED NOT DETECTED Final   Vibrio species NOT DETECTED NOT DETECTED Final   Vibrio cholerae NOT DETECTED NOT DETECTED Final   Enteroaggregative E coli (EAEC) NOT DETECTED NOT DETECTED Final   Enteropathogenic E coli (EPEC) NOT DETECTED NOT DETECTED Final   Enterotoxigenic E coli (ETEC) NOT DETECTED NOT DETECTED Final   Shiga like toxin producing E coli (STEC) NOT DETECTED NOT DETECTED Final   Shigella/Enteroinvasive E coli (EIEC) NOT DETECTED NOT DETECTED Final   Cryptosporidium NOT DETECTED NOT DETECTED Final   Cyclospora cayetanensis NOT DETECTED NOT DETECTED Final   Entamoeba histolytica NOT DETECTED NOT DETECTED Final   Giardia lamblia NOT DETECTED NOT DETECTED Final   Adenovirus F40/41 NOT DETECTED NOT DETECTED Final   Astrovirus NOT DETECTED NOT DETECTED Final   Norovirus GI/GII NOT DETECTED NOT DETECTED Final   Rotavirus A NOT DETECTED NOT DETECTED Final   Sapovirus (I, II, IV, and V) NOT DETECTED NOT DETECTED Final    Comment: Performed at [facility], [place] [place], [place] [address]  C Difficile Quick Screen w PCR reflex     Status: None   Collection Time: 11/28/21  1:30 AM   Specimen: STOOL  Result Value Ref Range Status   C Diff antigen NEGATIVE NEGATIVE Final   C Diff toxin NEGATIVE NEGATIVE Final   C Diff interpretation No C. difficile detected.  Final    Comment: Performed at [facility], [place] [address], [place], [place] [address]  Culture, blood (routine x 2)     Status: None (Preliminary result)   Collection Time: 11/29/21  6:15 AM   Specimen: BLOOD RIGHT HAND  Result Value Ref Range Status   Specimen Description   Final    BLOOD RIGHT HAND Performed at [facility] [address], [place], [place] [address]    Special Requests   Final    BOTTLES DRAWN AEROBIC ONLY Blood Culture adequate  volume Performed at [facility] [address], [place], [place] [address]    Culture   Final    NO GROWTH 4 DAYS Performed at [facility], [place] [address] [place], [place] [address]    Report Status PENDING  Incomplete  Culture, blood (routine  x 2)     Status: None (Preliminary result)   Collection Time: 11/29/21  6:16 AM   Specimen: BLOOD  Result Value Ref Range Status   Specimen Description   Final    BLOOD Performed at [facility] [address], [place], [place] [address]    Special Requests   Final    BOTTLES DRAWN AEROBIC ONLY Blood Culture results may not be optimal due to an inadequate volume of blood received in culture bottles Performed at [facility] [address] [place], [place] [address]    Culture   Final    NO GROWTH 4 DAYS Performed at [facility], [place] [address] [place], [place] [address]    Report Status PENDING  Incomplete        Radiology Studies: DG Abd Portable 1V  Result Date: 12/02/2021 CLINICAL DATA:  Abdominal pain. Colonoscopy 3 days ago. History of Crohn's disease. EXAM: PORTABLE ABDOMEN - 1 VIEW COMPARISON:  07/08/2021 and older exams.  CT, 11/27/2021. FINDINGS: Mildly dilated small bowel in the central abdomen. Air is seen within normal caliber colon. Bowel anastomosis staples noted in the upper and left mid abdomen, stable. IMPRESSION: 1. Mildly dilated small bowel, nonspecific. This may reflect a mild adynamic ileus. Low-grade partial small bowel obstruction not excluded but felt less likely. Electronically Signed   By: Lajean Manes M.D.   On: 12/02/2021 14:53        Scheduled Meds:  amLODipine  10 mg Oral Daily   buPROPion ER  200 mg Oral Daily   calcium carbonate  1,250 mg Oral Q breakfast   carvedilol  12.5 mg Oral BID WC   Chlorhexidine Gluconate Cloth  6 each Topical Daily   cholecalciferol  1,000 Units Oral Daily   cycloSPORINE  1 drop Both Eyes BID    DULoxetine  120 mg Oral Daily   estradiol  2 mg Oral Daily   feeding supplement  1 Container Oral TID BM   heparin injection (subcutaneous)  5,000 Units Subcutaneous Q8H   hydrALAZINE  100 mg Oral TID   HYDROmorphone  4 mg Oral QID   levETIRAcetam  500 mg Oral BID   lipase/protease/amylase  36,000 Units Oral TID with meals   methadone  5 mg Oral 5 X Daily   methocarbamol  500 mg Oral BID   multivitamin with minerals   Oral Daily   pantoprazole  40 mg Oral Daily   sodium chloride flush  10-40 mL Intracatheter Q12H   sucralfate  1 g Oral BID AC   Teduglutide (rDNA)  1.5 mg Subcutaneous Daily   vitamin B-12  1,000 mcg Oral Daily   Continuous Infusions:  dextrose 5 % and 0.45% NaCl Stopped (12/03/21 0932)   fluconazole (DIFLUCAN) IV 100 mL/hr at 12/03/21 0951     LOS: 6 days     Cordelia Poche, MD [facility] 12/03/2021, 2:35 PM  If 7PM-7AM, please contact night-coverage [email or web address]

## 2021-12-03 NOTE — Plan of Care (Signed)

## 2021-12-04 ENCOUNTER — Inpatient Hospital Stay (HOSPITAL_COMMUNITY): Payer: Medicare Other

## 2021-12-04 DIAGNOSIS — K50119 Crohn's disease of large intestine with unspecified complications: Secondary | ICD-10-CM | POA: Diagnosis not present

## 2021-12-04 DIAGNOSIS — N1832 Chronic kidney disease, stage 3b: Secondary | ICD-10-CM

## 2021-12-04 DIAGNOSIS — G894 Chronic pain syndrome: Secondary | ICD-10-CM | POA: Diagnosis not present

## 2021-12-04 DIAGNOSIS — T827XXD Infection and inflammatory reaction due to other cardiac and vascular devices, implants and grafts, subsequent encounter: Secondary | ICD-10-CM

## 2021-12-04 DIAGNOSIS — K912 Postsurgical malabsorption, not elsewhere classified: Secondary | ICD-10-CM

## 2021-12-04 DIAGNOSIS — K50118 Crohn's disease of large intestine with other complication: Secondary | ICD-10-CM | POA: Diagnosis not present

## 2021-12-04 DIAGNOSIS — N179 Acute kidney failure, unspecified: Secondary | ICD-10-CM | POA: Diagnosis not present

## 2021-12-04 DIAGNOSIS — B377 Candidal sepsis: Secondary | ICD-10-CM | POA: Diagnosis not present

## 2021-12-04 LAB — CREATININE, SERUM
Creatinine, Ser: 1.98 mg/dL — ABNORMAL HIGH (ref 0.44–1.00)
GFR, Estimated: 28 mL/min — ABNORMAL LOW (ref >60)

## 2021-12-04 LAB — ANTIFUNGAL AST 9 DRUG PANEL
Amphotericin B MIC: 0.5
Anidulafungin MIC: 0.25
Caspofungin MIC: 0.5
Fluconazole Islt MIC: 0.5
Flucytosine MIC: 64
Itraconazole MIC: 0.25
Micafungin MIC: 0.12
Posaconazole MIC: 0.06
Source: 183119
Voriconazole MIC: 0.016

## 2021-12-04 LAB — CULTURE, BLOOD (ROUTINE X 2)
Culture: NO GROWTH
Culture: NO GROWTH
Special Requests: ADEQUATE

## 2021-12-04 MED ORDER — MIDAZOLAM HCL 2 MG/2ML IJ SOLN
INTRAMUSCULAR | Status: AC
  Filled 2021-12-04: qty 2

## 2021-12-04 MED ORDER — FENTANYL CITRATE (PF) 100 MCG/2ML IJ SOLN
INTRAMUSCULAR | Status: AC
  Filled 2021-12-04: qty 2

## 2021-12-04 NOTE — Progress Notes (Signed)
PHARMACY CONSULT NOTE FOR:  OUTPATIENT  PARENTERAL ANTIBIOTIC THERAPY (OPAT)  Indication: Candida lusitaniae fungemia Regimen: Fluconazole 200 mg IV every 24 hours End date: 12/13/21  IV antibiotic discharge orders are pended. To discharging provider:  please sign these orders via discharge navigator,  Select New Orders & click on the button choice - Manage This Unsigned Work.     Thank you for allowing pharmacy to be a part of this patient's care.  Lawson Radar 12/04/2021, 9:10 AM

## 2021-12-04 NOTE — Progress Notes (Signed)
[facility] for Infectious Disease  Date of Admission:  11/27/2021           Reason for visit: Follow up on fungemia  Current antibiotics: Fluconazole 1/5-present   Previous antibiotics: Eraxis 1/3-1/4   ASSESSMENT:    61 y.o. female admitted with:  Candida lusitaniae fungemia: S/P right femoral line removal on 11/29/21 and clearance of blood cultures that date as well (finalized no growth).  Awaiting IR replacement of new line either today or tomorrow. 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  She has outpatient follow up in place as well for 12/26/21 at 11:30am Plan for 2 weeks antifungals from date of line removal (and clearance of fungemia) through 12/13/21 Line replacement per IR schedule but okay to proceed See OPAT note below.   Diagnosis: Candida lusitaniae fungemia   Allergies  Allergen Reactions   Meperidine Hives    Other reaction(s): GI Upset Due to [place]    Hyoscyamine Hives and Swelling    Legs swelling Disorientation   Cefepime Other (See Comments)    Neurotoxicity occurring in setting of AKI. Ceftriaxone tolerated during same admit   Gabapentin Other (See Comments)    unknown   Lyrica [Pregabalin] Other (See Comments)    Has chronic dehydration and made it worse   Topamax [Topiramate] Other (See Comments)    Has chronic dehydration and made it worse   Zosyn [Piperacillin Sod-Tazobactam So] Other (See Comments)    Patient reports it makes her vomit, her neck stiff, and her "heart feel funny" Affected kidneys   Fentanyl Rash    Pt is allergic to fentanyl patch related to the glue (gives her a rash) Pt states she is NOT allergic to fentanyl IV medicine   Morphine And Related Rash    OPAT Orders Discharge antibiotics to be given via PICC line Discharge  antibiotics: Per pharmacy protocol fluconazole  Duration: 2 weeks End Date: 12/13/21  [facility] Care Per Protocol:  Home health RN for IV administration and teaching; PICC line care and labs.    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[place]) Active Problems:   Hypertension   AKI (acute kidney injury) ([place])   Crohn's disease ([place])   Short gut syndrome   Vitamin B12 deficiency   Depression   Chronic pain syndrome   LFTs abnormal   Abdominal pain   Chronic kidney disease, stage 3b ([name])   Anemia   Candidemia ([place])   Nausea & vomiting   Seizures ([place])   Sepsis ([place])   PICC (peripherally inserted central catheter) in place   Pancreatic insufficiency    MEDICATIONS:    Scheduled Meds:  amLODipine  10 mg Oral Daily   buPROPion ER  200 mg Oral Daily   calcium carbonate  1,250 mg Oral Q breakfast   carvedilol  12.5 mg Oral BID WC   Chlorhexidine Gluconate Cloth  6  each Topical Daily   cholecalciferol  1,000 Units Oral Daily   cycloSPORINE  1 drop Both Eyes BID   DULoxetine  120 mg Oral Daily   estradiol  2 mg Oral Daily   feeding supplement  1 Container Oral TID BM   heparin injection (subcutaneous)  5,000 Units Subcutaneous Q8H   hydrALAZINE  100 mg Oral TID   HYDROmorphone  4 mg Oral QID   levETIRAcetam  500 mg Oral BID   lipase/protease/amylase  36,000 Units Oral TID with meals   methadone  5 mg Oral 5 X Daily   methocarbamol  500 mg Oral BID   multivitamin with minerals   Oral Daily   pantoprazole  40 mg Oral Daily   sodium chloride flush  10-40 mL Intracatheter Q12H    sucralfate  1 g Oral BID AC   Teduglutide (rDNA)  1.5 mg Subcutaneous Daily   vitamin B-12  1,000 mcg Oral Daily   Continuous Infusions:  dextrose 5 % and 0.45% NaCl 100 mL/hr at 12/04/21 0322   fluconazole (DIFLUCAN) IV Stopped (12/03/21 1032)   PRN Meds:.HYDROmorphone (DILAUDID) injection, ondansetron **OR** ondansetron (ZOFRAN) IV, polyvinyl alcohol  SUBJECTIVE:     No new complaints Waiting on IR line placement Afebrile Up and out of bed.   Review of Systems  All other systems reviewed and are negative.    OBJECTIVE:   Blood pressure 132/66, pulse 66, temperature 98.7 F (37.1 C), temperature source Oral, resp. rate 16, height _0  (1.727 m), weight 62 kg, SpO2 100 %. Body mass index is 20.78 kg/m.  Physical Exam Constitutional:      General: She is not in acute distress.    Appearance: She is well-developed.  HENT:     Head: Normocephalic and atraumatic.  Eyes:     Extraocular Movements: Extraocular movements intact.     Conjunctiva/sclera: Conjunctivae normal.  Pulmonary:     Effort: Pulmonary effort is normal. No respiratory distress.  Musculoskeletal:        General: Normal range of motion.     Cervical back: Normal range of motion and neck supple.  Skin:    General: Skin is warm and dry.  Neurological:     General: No focal deficit present.     Mental Status: She is alert and oriented to person, place, and time.  Psychiatric:        Mood and Affect: Mood normal.        Behavior: Behavior normal.     Lab Results: Lab Results  Component Value Date   WBC 3.1 (L) 12/03/2021   HGB 7.5 (L) 12/03/2021   HCT 23.0 (L) 12/03/2021   MCV 93.5 12/03/2021   PLT 209 12/03/2021    Lab Results  Component Value Date   NA 137 12/03/2021   K 3.3 (L) 12/03/2021   CO2 20 (L) 12/03/2021   GLUCOSE 90 12/03/2021   BUN 15 12/03/2021   CREATININE 1.98 (H) 12/04/2021   CALCIUM 9.1 12/03/2021   GFRNONAA 28 (L) 12/04/2021   GFRAA 55 (L) 08/12/2020    Lab  Results  Component Value Date   ALT 156 (H) 11/30/2021   AST 32 11/30/2021   ALKPHOS 311 (H) 11/30/2021   BILITOT 0.7 11/30/2021       Component Value Date/Time   CRP 1.0 (H) 09/29/2021 0803       Component Value Date/Time   ESRSEDRATE 33 (H) 07/05/2021 2138     I have reviewed the micro and lab  results in [place].  Imaging: DG Abd Portable 1V  Result Date: 12/02/2021 CLINICAL DATA:  Abdominal pain. Colonoscopy 3 days ago. History of Crohn's disease. EXAM: PORTABLE ABDOMEN - 1 VIEW COMPARISON:  07/08/2021 and older exams.  CT, 11/27/2021. FINDINGS: Mildly dilated small bowel in the central abdomen. Air is seen within normal caliber colon. Bowel anastomosis staples noted in the upper and left mid abdomen, stable. IMPRESSION: 1. Mildly dilated small bowel, nonspecific. This may reflect a mild adynamic ileus. Low-grade partial small bowel obstruction not excluded but felt less likely. Electronically Signed   By: Lajean Manes M.D.   On: 12/02/2021 14:53     Imaging independently reviewed in Epic.    [name] [phone] pager 12/04/2021, 11:20 AM  I spent greater than 35 minutes with the patient including greater than 50% of time in face to face counsel of the patient and in coordination of their care.

## 2021-12-04 NOTE — Progress Notes (Signed)
Nutrition Follow-up  DOCUMENTATION CODES:   Non-severe (moderate) malnutrition in context of chronic illness  INTERVENTION:  - continue Boost Breeze BID. - TPN initiation and management per Pharmacist.   NUTRITION DIAGNOSIS:   Moderate Malnutrition related to chronic illness (Crohn's with SBS on chronic home TPN) as evidenced by mild fat depletion, mild muscle depletion, moderate muscle depletion. -ongoing  GOAL:   Patient will meet greater than or equal to 90% of their needs -unable to meet with oral intake alone.  MONITOR:   PO intake, Supplement acceptance, Labs, Other (Comment) (TPN regimen)   ASSESSMENT:   61 y.o. female with medical history of Crohn's disease s/p multiple resection c/b short gut syndrome and severe malnutrition on chronic TPN since 2003, poor peripheral access with R femoral PICC line, stage 4 CKD, anxiety, depression, seizure disorder, HTN, and recurrent CLABSI. She presented to the ED with abdominal discomfort, vomiting, and fever.  Diet advanced from CLD to Soft on 1/4 at 1439 and then changed to NPO today at 0944. Flow sheet documentation indicates she ate 75% of dinner on 1/4 and since that time all documented meal intakes were 0%.  Patient currently out of the room to IR for tunneled PICC placement.   Weight today is -12 lb compared to weight on 1/4. No information documented in the edema section of flow sheet this admission.  Patient is on home, cyclic TPN.     Labs reviewed; K: 3.3 mmol/l, creatinine: 1.98 mg/dl, alk phos elevated, GFR: 28 ml/min.  Medications reviewed; 1 tablet os-cal, 1000 units cholecalciferol/day, 36000 units creon TID, 1 tablet multivitamin with minerals/day, 40 mg oral protonix/day, 1 g carafate BID, 1000 mcg oral cyanocobalamin/day.   IVF; D5-1/2 NS @ 100 ml/hr (408 kcal/24 hrs).   Diet Order:   Diet Order             Diet NPO time specified Except for: Sips with Meds  Diet effective now                    EDUCATION NEEDS:   Education needs have been addressed  Skin:  Skin Assessment: Skin Integrity Issues: Skin Integrity Issues:: Incisions Incisions: R abdomen (12/20)  Last BM:  1/9 (type 6 x1)  Height:   Ht Readings from Last 1 Encounters:  11/29/21 5' 8"  (1.727 m)    Weight:   Wt Readings from Last 1 Encounters:  11/29/21 62 kg     Estimated Nutritional Needs:  Kcal:  2000-2200 kcal Protein:  100-115 grams Fluid:  >/= 2 L/day      Jarome Matin, MS, RD, LDN Inpatient Clinical Dietitian RD pager # available in [place]  After hours/weekend pager # available in [facility]

## 2021-12-04 NOTE — Progress Notes (Signed)
Referring Physician(s): Caitlin Peters  Supervising Physician: Caitlin Peters  Patient Status:  [name] - In-pt  Chief Complaint:  Poor venous access, recent fungemia  Subjective: Pt known to IR service from multiple vascular access procedures, latest being rt fem tunneled PICC removal on 1/4 due to hx fungemia. Pt also underwent random liver bx on 11/14/21. She has a history of crohn's disease s/p multiple resection c/b short gut syndrome and severe malnutrition on chronic TPN since 2003, poor peripheral access with right thigh picc line, stage IV CKD, anxiety and depression, seizure disorder, hypertension, recurrent CLABSI. Patient presented secondary to abdominal pain, vomiting and fever with concern for Crohn flare but found to have associated Candidemia. Eraxis started with transition to Fluconazole IV. TPN held and PICC line removed on 1/4. Colonoscopy on 1/4 negative for evidence of Crohn flare. Repeat blood cultures no growth to date. Request now received for new tunneled PICC insertion. She is afebrile, WBC 3.1, HGB 7.5, PLTS NL. She denies HA,CP,dyspnea, cough, back pain,N/V or visible bleeding. She does have some mild gen abd discomfort.   Past Medical History:  Diagnosis Date   Acute pancreatitis 04/13/2020   Anasarca 10/2019   AVN (avascular necrosis of bone) ([name])    Cataract    Choledocholithiasis (sludge) s/p ERCP 10/2019 10/21/2020   Chronic pain syndrome    CKD (chronic kidney disease), stage III ([name])    Depression    Diverticulosis    GERD (gastroesophageal reflux disease)    HTN (hypertension)    IDA (iron deficiency anemia)    Malnutrition ([place])    Mass in chest    Osteoporosis 12/24/2014   Pancreatitis    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; 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; jejunal to transverse colon anastomosis Treated at [place] SB lengthening to 165cm Dr Caitlin Peters, [facility]   Vitamin B12 deficiency    Past Surgical History:  Procedure Laterality Date   ABDOMINAL ADHESION SURGERY  01/22/2018   APPENDECTOMY  1989   BILIARY DILATION  11/26/2019   Procedure: BILIARY DILATION;  Surgeon: Caitlin Denmark, MD;  Location: [facility];  Service: Endoscopy;;   BILIARY DILATION  03/08/2020   Procedure: BILIARY DILATION;  Surgeon: Caitlin Copas., MD;  Location: [facility];  Service: Gastroenterology;;   BIOPSY  03/08/2020   Procedure: BIOPSY;  Surgeon: Caitlin Copas., MD;  Location: [facility] ENDOSCOPY;  Service: Gastroenterology;;   CHEST WALL RESECTION     right thoracotomy,resection of chest mass with anterior rib and reconstruction using prosthetic mesh and video arthroscopy   CHOLECYSTECTOMY  01/22/2018   COLONOSCOPY  2019   COLONOSCOPY WITH PROPOFOL N/A 11/29/2021   Procedure: COLONOSCOPY WITH PROPOFOL;  Surgeon: Caitlin Flock, MD;  Location: [facility];  Service: Gastroenterology;  Laterality: N/A;   ENTEROSTOMY CLOSURE  04/1999   ERCP N/A 11/26/2019   Procedure: ENDOSCOPIC RETROGRADE CHOLANGIOPANCREATOGRAPHY (ERCP);  Surgeon: Caitlin Denmark, MD;  Location: [facility];  Service: Endoscopy;  Laterality: N/A;   ERCP N/A 03/08/2020   Procedure: ENDOSCOPIC RETROGRADE CHOLANGIOPANCREATOGRAPHY (ERCP);  Surgeon: Caitlin Copas., MD;  Location: [facility];  Service: Gastroenterology;  Laterality: N/A;   ESOPHAGOGASTRODUODENOSCOPY N/A 03/08/2020   Procedure: ESOPHAGOGASTRODUODENOSCOPY (EGD);  Surgeon: Caitlin Copas., MD;  Location: [facility];  Service: Gastroenterology;  Laterality: N/A;   EUS N/A 03/08/2020   Procedure: UPPER ENDOSCOPIC ULTRASOUND (EUS) LINEAR;  Surgeon: Caitlin Britain  Brooke Bonito., MD;  Location: [place];  Service: Gastroenterology;  Laterality: N/A;   ILEOCECETOMY  03/1999   ileocolon resection with abdominal stoma   ILEOSTOMY CLOSURE  2001   IR FLUORO GUIDE CV LINE LEFT  01/07/2020    IR FLUORO GUIDE CV LINE LEFT  03/09/2020   IR FLUORO GUIDE CV LINE LEFT  05/09/2020   IR FLUORO GUIDE CV LINE LEFT  07/20/2020   IR FLUORO GUIDE CV LINE RIGHT  08/05/2020   IR FLUORO GUIDE CV LINE RIGHT  04/03/2021   IR PTA VENOUS EXCEPT DIALYSIS CIRCUIT  01/07/2020   IR REMOVAL TUN CV CATH W/O FL  08/05/2020   IR REMOVAL TUN CV CATH W/O FL  07/11/2021   IR REMOVAL TUN CV CATH W/O FL  11/29/2021   IR US GUIDE VASC ACCESS LEFT     x 2 06/17/19 and 09/14/2019   IR US GUIDE VASC ACCESS RIGHT  08/05/2020   KNEE SURGERY     right knee    LAPAROSCOPIC SMALL BOWEL RESECTION  2009   2000-2009.  SB resections for Crohns Disease - now with Short gut   OMENTECTOMY  01/22/2018   PARTIAL HYSTERECTOMY  1984   with LSO   REMOVAL OF STONES  11/26/2019   Procedure: REMOVAL OF STONES;  Surgeon: Caitlin Denmark, MD;  Location: [facility];  Service: Endoscopy;;   REMOVAL OF STONES  03/08/2020   Procedure: REMOVAL OF STONES;  Surgeon: Caitlin Copas., MD;  Location: [facility] ENDOSCOPY;  Service: Gastroenterology;;   SALPINGOOPHORECTOMY Left 1984   SALPINGOOPHORECTOMY Right 1990   SERIAL TRANSVERSE ENTEROPLASTY (STEP) - SMALL BOWEL LENGTHENING  01/22/2018   Dr Caitlin Peters, Procedure [facility] - SB length from 120 to 165cm    SMALL INTESTINE SURGERY  2002   SMALL INTESTINE SURGERY  2003   SPHINCTEROTOMY  11/26/2019   Procedure: SPHINCTEROTOMY;  Surgeon: Caitlin Denmark, MD;  Location: [facility];  Service: Endoscopy;;   TOTAL ABDOMINAL HYSTERECTOMY  1990   with RSO   UPPER GASTROINTESTINAL ENDOSCOPY        Allergies: Meperidine, Hyoscyamine, Cefepime, Gabapentin, Lyrica [pregabalin], Topamax [topiramate], Zosyn [piperacillin sod-tazobactam so], Fentanyl, and Morphine and related  Medications: Prior to Admission medications   Medication Sig Start Date End Date Taking? Authorizing Provider  acetaminophen (TYLENOL) 650 MG CR tablet Take 1,300 mg by mouth every 6 (six) hours.   Yes [provider]   amLODipine (NORVASC) 10 MG tablet Take 1 tablet by mouth once daily Patient taking differently: Take 10 mg by mouth daily. 08/22/21  Yes Caitlin Peters, Caitlin Halsted, MD  buPROPion ER [facility] SR) 100 MG 12 hr tablet Take 2 tablets by mouth once daily Patient taking differently: Take 200 mg by mouth daily. 08/30/21  Yes Caitlin Peters, Caitlin Halsted, MD  Calcium 500 MG tablet Take 500 mg by mouth daily.   Yes [provider]  carvedilol (COREG) 12.5 MG tablet Take 1 tablet (12.5 mg total) by mouth 2 (two) times daily with a meal. 09/29/21  Yes Little Ishikawa, MD  cholecalciferol (VITAMIN D3) 25 MCG (1000 UT) tablet Take 1,000 Units by mouth daily.    Yes [provider]  ciprofloxacin (CIPRO) 400 MG/200ML SOLN Inject 400 mg into the vein daily. 11/16/21  Yes [provider]  cycloSPORINE (RESTASIS) 0.05 % ophthalmic emulsion Place 1 drop into both eyes 2 (two) times daily.   Yes [provider]  DEXILANT 60 MG capsule Take 1 capsule by mouth once daily 11/21/21  Yes Caitlin Peters, Caitlin Halsted, MD  diphenoxylate-atropine (LOMOTIL) 2.5-0.025 MG tablet Take 1 tablet by mouth 2 (two) times daily as needed for diarrhea or loose stools. 09/04/21  Yes [provider]  DULoxetine (CYMBALTA) 60 MG capsule Take 2 capsules (120 mg total) by mouth daily. 09/15/21  Yes Caitlin Peters, Caitlin Halsted, MD  estradiol (ESTRACE) 2 MG tablet Take 1 tablet by mouth once daily Patient taking differently: Take 2 mg by mouth daily. 10/03/21  Yes Caitlin Peters, Caitlin Halsted, MD  famotidine (PEPCID) 20 MG tablet Take 20 mg by mouth daily as needed for heartburn or indigestion.   Yes [provider]  GATTEX 5 MG KIT Inject 1.5 mg into the skin daily. 01/03/21  Yes [provider]  hydrALAZINE (APRESOLINE) 100 MG tablet Take 1 tablet (100 mg total) by mouth 3 (three) times daily. 11/07/21  Yes Caitlin Peters, Caitlin Halsted, MD  HYDROmorphone (DILAUDID) 4 MG tablet Take 4  mg by mouth 4 (four) times daily. 04/18/21  Yes [provider]  levETIRAcetam (KEPPRA) 500 MG tablet Take 1 tablet (500 mg total) by mouth 2 (two) times daily. 08/28/21  Yes Cameron Sprang, MD  lipase/protease/amylase (CREON) 36000 UNITS CPEP capsule Take 1 capsule (36,000 Units total) by mouth See admin instructions. 36000 units with meals Patient taking differently: Take 36,000 Units by mouth 3 (three) times daily with meals. 06/30/21  Yes Eugenie Filler, MD  loperamide (IMODIUM) 2 MG capsule Take 4 mg by mouth 2 (two) times daily as needed for diarrhea or loose stools. 09/26/21  Yes [provider]  methadone (DOLOPHINE) 5 MG tablet Take 1 tablet (5 mg total) by mouth 5 (five) times daily. 09/29/21  Yes Little Ishikawa, MD  methocarbamol (ROBAXIN) 500 MG tablet Take 1 tablet (500 mg total) by mouth in the morning and at bedtime. 06/13/21  Yes Caitlin Peters, Caitlin Halsted, MD  Multiple Vitamins-Minerals (MULTIVITAMIN ADULT PO) Take 1 tablet by mouth daily.   Yes [provider]  NARCAN 4 MG/0.1ML LIQD nasal spray kit Place 1 spray into the nose once as needed (overdose). 10/14/20  Yes [provider]  ondansetron (ZOFRAN) 4 MG tablet Take 4 mg by mouth every 8 (eight) hours as needed for nausea or vomiting. 11/18/21  Yes [provider]  polyvinyl alcohol (LIQUIFILM TEARS) 1.4 % ophthalmic solution Place 1 drop into both eyes daily as needed for dry eyes.   Yes [provider]  PRESCRIPTION MEDICATION Inject 1 each into the vein daily. Home TPN . [facility] [place] . 1 bag for 12 hours. [phone]   Yes [provider]  promethazine (PHENERGAN) 25 MG tablet TAKE 1 TABLET BY MOUTH EVERY 6 HOURS AS NEEDED FOR NAUSEA FOR VOMITING Patient taking differently: Take 25 mg by mouth every 8 (eight) hours as needed for vomiting or nausea. 11/16/21  Yes Caitlin Peters, Caitlin Halsted, MD  sodium chloride 0.9 % infusion Inject  1,000 mLs into the vein daily. For chronic dehydration 10/16/20  Yes [provider]  sucralfate (CARAFATE) 1 GM/10ML suspension Take 10 mLs (1 g total) by mouth 2 (two) times daily. 06/30/21  Yes Eugenie Filler, MD  Trace Minerals Cu-Mn-Se-Zn (TRALEMENT IV) Inject 1 mL into the vein See admin instructions. Used in TPN bag 4 times weekly   Yes [provider]  vitamin B-12 (CYANOCOBALAMIN) 1000 MCG tablet Take 1,000 mcg by mouth daily.    Yes [provider]  Vital Signs: BP 132/66 (BP Location: Left Arm)    Pulse 66    Temp 98.7 F (37.1 C) (Oral)    Resp 16    Ht _0  (1.727 m)    Wt 136 lb 11 oz (62 kg)    SpO2 100%    BMI 20.78 kg/m   Physical Exam awake/alert; chest- CTA bilat; heart-RRR; abd- sl taut, few BS, mild gen tenderness to palpation; no LE edema.  Imaging: DG Abd Portable 1V  Result Date: 12/02/2021 CLINICAL DATA:  Abdominal pain. Colonoscopy 3 days ago. History of Crohn's disease. EXAM: PORTABLE ABDOMEN - 1 VIEW COMPARISON:  07/08/2021 and older exams.  CT, 11/27/2021. FINDINGS: Mildly dilated small bowel in the central abdomen. Air is seen within normal caliber colon. Bowel anastomosis staples noted in the upper and left mid abdomen, stable. IMPRESSION: 1. Mildly dilated small bowel, nonspecific. This may reflect a mild adynamic ileus. Low-grade partial small bowel obstruction not excluded but felt less likely. Electronically Signed   By: Lajean Manes M.D.   On: 12/02/2021 14:53    Labs:  CBC: Recent Labs    11/27/21 2126 11/28/21 0338 11/30/21 0323 12/03/21 0300  WBC 2.5* 2.3* 2.5* 3.1*  HGB 8.6* 7.5* 7.5* 7.5*  HCT 25.9* 22.7* 23.0* 23.0*  PLT 158 138* 156 209    COAGS: Recent Labs    05/06/21 2010 06/19/21 2145 07/10/21 1334 11/11/21 0356 11/14/21 0503  INR 0.9 1.0 1.1 1.0 1.1  APTT 25 30 36  --   --     BMP: Recent Labs    11/27/21 1220 11/27/21 2126 11/28/21 0338 11/30/21 0323 12/03/21 0300 12/04/21 0324  NA  134*  --  136 138 137  --   K 4.7  --  4.4 3.6 3.3*  --   CL 104  --  102 113* 108  --   CO2 25  --  24 20* 20*  --   GLUCOSE 87  --  70 128* 90  --   BUN 46*  --  37* 20 15  --   CALCIUM 9.2  --  9.2 8.7* 9.1  --   CREATININE 2.29*   < > 1.91* 1.77* 1.60* 1.98*  GFRNONAA 24*   < > 30* 33* 37* 28*   < > = values in this interval not displayed.    LIVER FUNCTION TESTS: Recent Labs    11/16/21 0343 11/27/21 1220 11/28/21 0338 11/30/21 0323  BILITOT 0.9 1.0 1.0 0.7  AST 33 226* 110* 32  ALT 242* 450* 307* 156*  ALKPHOS 361* 469* 386* 311*  PROT 5.6* 6.5 5.9* 5.6*  ALBUMIN 2.2* 2.8* 2.5* 2.3*    Assessment and Plan: Pt known to IR service from multiple vascular access procedures, latest being rt fem tunneled PICC removal on 1/4 due to hx fungemia. She has a hx of very poor venous access with fem vein utilization for prior access procedures. Pt also underwent random liver bx on 11/14/21. She has a history of crohn's disease s/p multiple resection c/b short gut syndrome and severe malnutrition on chronic TPN since 2003, poor peripheral access with right thigh picc line, stage IV CKD, anxiety and depression, seizure disorder, hypertension, recurrent CLABSI. Patient presented secondary to abdominal pain, vomiting and fever with concern for Crohn flare but found to have associated Candidemia. Eraxis started with transition to Fluconazole IV. TPN held and PICC line removed on 1/4. Colonoscopy on 1/4 negative for evidence of Crohn flare. Repeat blood cultures no  growth to date. Request now received for new tunneled PICC insertion. She is afebrile, WBC 3.1, HGB 7.5, PLTS NL. Left fem vein patent by bedside US last week. Details/risks of procedure, incl but not limited to, internal bleeding, infection, injury to adjacent structures d/w pt with her understanding and consent. Procedure tent scheduled for today or tomorrow. Pt has peripheral IV in place.    Electronically Signed: D. Rowe Robert,  PA-C 12/04/2021, 10:14 AM   I spent a total of 15 Minutes at the the patient's bedside AND on the patient's hospital floor or unit, greater than 50% of which was counseling/coordinating care for tunneled central venous catheter placement    Patient ID: Caitlin Peters, female   DOB: [birth year], 61 y.o.   MRN: [id number]

## 2021-12-04 NOTE — Progress Notes (Signed)
PROGRESS NOTE    Caitlin Peters  RCB:[id number] DOB: 10/10/1961 DOA: 11/27/2021 PCP: [name], [name], MD   Brief Narrative: Caitlin Peters is a 61 y.o. 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 Patient presented secondary to abdominal pain, vomiting and fever with concern for Crohn flare but found to have associated Candidemia. Eraxis started with transition to Fluconazole IV. TPN held and PICC line removed on 1/4. Colonoscopy on 1/4 negative for evidence of Crohn flare. Repeat blood cultures no growth to date. Plan for PICC reinsertion 1/9.   Assessment & Plan:   * 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[place]) Colonoscopy not consistent with Crohn flare. GI consulted. GI pathogen panel and C. Difficile ordered but no sample to date. Previously on budesonide but discontinued in 2022.  Nausea & vomiting- (present on admission) Occurring 1/6. Unsure of etiology. Associated abdominal pain but this is also a chronic issue. Patient vomited food from the whole day. Abdominal x-ray with possible ileus. Now appears resolved. 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 In setting of tunneled IV for chronic TPN. Initial blood cultures significant for Candida Lusitaniae. Patient empirically started on Eraxis. Infectious disease on board and patient transitioned to fluconazole 400 mg x 1 dose tapering to fluconazole 200 mg daily. Transthoracic Echocardiogram (1/4) obtained and negative for evidence of valvular vegetations. Repeat blood culture (1/4) with no growth to date. -Infectious disease recommendations (1/5): Continue fluconazole with plan for 2 weeks of treatment (1/4 - 1/18), outpatient ophthalmology followup, await susceptibilities,  outpatient ID follow-up on 1/31 at 11:30 AM.  Sepsis ([place])- (present on admission) Present on admission. Secondary to Candidemia.  Anemia- (present on admission) Chronic issue. Hemoglobin drifted down. No overt bleeding noted. Previous iron panel in 6/22 was normal, although ferritin was on the low end of normal. Associated normocytosis. Patient possibly with hemoconcentration on admission. Hemoglobin stable. -Transfuse for hemoglobin <7  LFTs abnormal- (present on admission) Chronic. 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  AKI (acute kidney injury) ([place])- (present on admission) Baseline creatinine of about 1.5-1.6. Creatinine of 2.29 no admission. Likely secondary to decreased oral intake and diarrhea with suspected Crohn flare. No hydronephrosis on CT imaging. Initially improved with IV fluids with slight bump today likely related to emesis over the weekend -Continue IV fluids while off of TPN -D5 1/2 NS @ 100 mL/hr  PICC (peripherally inserted central catheter) in place Limited IV access. Initially with right femoral vein PICC line which was removed on 1/4 by IR secondary to candidemia. Plan for PICC reinsertion 1/9.  Seizures ([place]) -Continue Keppra  Chronic kidney disease, stage 3b ([place])- (present on admission) Baseline creatinine 1.5-1.6  Abdominal pain- (present on admission) Secondary to colitis. Negative C. Difficile/GI pathogen panel testing. Colonoscopy performed on 1/4 without evidence of active Crohn. Abdominal pain is improved and patient currently has her chronic pain.  Chronic pain syndrome- (present on admission) -Continue home meds (methadone, dilaudid PO) with prn IV breakthrough  Depression- (present on admission) -Continue Wellbutrin and Cymbalta  Crohn's disease ([place]) Hx crohn's with prior resection. No active  flare.  Hypertension- (present on admission) -Continue Amlodipine, Coreg and hydralazine  Bacteremia associated with intravascular line ([name])-resolved as of 11/29/2021, (present  on admission) Patient treated on previous admission and completed antibiotic course on 11/27/21. At that time, blood culture positive for stenotrophomonas maltophilia and pseudomonas putida. Repeat blood culture this admission shows treatment of bacteremia.  Pancreatic insufficiency -Continue Creon  Vitamin B12 deficiency- (present on admission) -Continue B12 supplementation  Short gut syndrome- (present on admission) -Continue teduglutide -TPN held secondary to line holiday; restarting TPN pending PICC placement    DVT prophylaxis: Heparin subq Code Status:   Code Status: Full Code Family Communication: None at bedside Disposition Plan: Discharge likely in 1-2 days pending cleared blood cultures, replacement of PICC for TPN   Consultants:  [facility] Infectious disease Interventional radiology  Procedures:  TRANSTHORACIC ECHOCARDIOGRAM (11/29/2021) IMPRESSIONS     1. Left ventricular ejection fraction, by estimation, is 65 to 70%. Left  ventricular ejection fraction by 3D volume is 67 %. The left ventricle has  normal function. The left ventricle has no regional wall motion  abnormalities. There is moderate left  ventricular hypertrophy. Left ventricular diastolic parameters are  consistent with Grade I diastolic dysfunction (impaired relaxation). The  average left ventricular global longitudinal strain is -24.9 %. The global  longitudinal strain is normal.   2. Right ventricular systolic function is normal. The right ventricular  size is normal. There is normal pulmonary artery systolic pressure.   3. Left atrial size was severely dilated.   4. The mitral valve is grossly normal. Mild mitral valve regurgitation.   5. The aortic valve is tricuspid. There is mild calcification of the   aortic valve. There is mild thickening of the aortic valve. Aortic valve  regurgitation is not visualized. Aortic valve sclerosis is present, with  no evidence of aortic valve stenosis.   6. The inferior vena cava is dilated in size with >50% respiratory  variability, suggesting right atrial pressure of 8 mmHg.   COLONOSCOPY (11/29/2021) Impression:               - Patent end-to-side ileo-colonic anastomosis,                            characterized by healthy appearing mucosa.                           - The examined portion of the ileum was normal.                           - Tortuous colon.                           - Stool in the entire examined colon which was                            lavaged.                           - Internal hemorrhoids.                           - The examination was otherwise normal.                           No endoscopic evidence of any active Crohn's  disease. In this light would not recommend any                            steroids / immunosuppression for Crohn's disease.                            Unclear if functional symptoms at this point.                            Patient noted to have "intolerance" listed to                            Levsin, consider trial of Bentyl or IB gard.                            Patient overall improved from admission symptoms at                            this time. On antifungal regimen per ID for                            candidemia / recurrent line infection - management                            per ID, line to be changed.  Recommendation:           - Return patient to hospital ward for ongoing care.                           - Advance diet as tolerated.                           - Continue present medications.                           - No active Crohn's disease - advance diet as                            tolerated. No plans for steroids /                             immunosuppression at this time                           - Management of recurrent line infection /                            candidemia per ID                           - Patient can follow up with [facility] clinic for                            short gut syndrome post discharge                           -  GI signing off for now, please call with                            questions moving forward   Antimicrobials: Levaquin IV Eraxis Fluconazole IV    Subjective: Continues to feel well. Some intermittent left quadrant pain which is chronic.  Objective: Vitals:   12/03/21 0916 12/03/21 1533 12/03/21 1650 12/03/21 2215  BP: (!) 150/68 (!) 116/58 (!) 128/59 132/66  Pulse: 66 65 64 66  Resp: 16 16  16   Temp: 98.7 F (37.1 C) 98.5 F (36.9 C)  98.7 F (37.1 C)  TempSrc: Oral Oral  Oral  SpO2: 100% 100%  100%  Weight:      Height:        Intake/Output Summary (Last 24 hours) at 12/04/2021 0853 Last data filed at 12/04/2021 0600 Gross per 24 hour  Intake 2294.29 ml  Output --  Net 2294.29 ml    Filed Weights   11/27/21 1225 11/29/21 1234  Weight: 61.2 kg 62 kg    Examination:  General exam: Appears calm and comfortable Respiratory system: Clear to auscultation. Respiratory effort normal. Cardiovascular system: S1 & S2 heard, RRR. No murmurs, rubs, gallops or clicks. Gastrointestinal system: Abdomen is nondistended, soft and tender in left quadrant. No organomegaly or masses felt. Normal bowel sounds heard. Central nervous system: Alert and oriented. No focal neurological deficits. Musculoskeletal: No edema. No calf tenderness Skin: No cyanosis. No rashes Psychiatry: Judgement and insight appear normal. Mood & affect appropriate.     Data Reviewed: I have personally reviewed following labs and imaging studies  CBC Lab Results  Component Value Date   WBC 3.1 (L) 12/03/2021   RBC 2.46 (L) 12/03/2021   HGB 7.5 (L) 12/03/2021   HCT 23.0 (L) 12/03/2021   MCV 93.5  12/03/2021   MCH 30.5 12/03/2021   PLT 209 12/03/2021   MCHC 32.6 12/03/2021   RDW 13.1 12/03/2021   LYMPHSABS 0.3 (L) 10/08/2021   MONOABS 0.2 10/08/2021   EOSABS 0.0 10/08/2021   BASOSABS 0.0 34/19/6222     Last metabolic panel Lab Results  Component Value Date   NA 137 12/03/2021   K 3.3 (L) 12/03/2021   CL 108 12/03/2021   CO2 20 (L) 12/03/2021   BUN 15 12/03/2021   CREATININE 1.98 (H) 12/04/2021   GLUCOSE 90 12/03/2021   GFRNONAA 28 (L) 12/04/2021   GFRAA 55 (L) 08/12/2020   CALCIUM 9.1 12/03/2021   PHOS 3.9 11/28/2021   PROT 5.6 (L) 11/30/2021   ALBUMIN 2.3 (L) 11/30/2021   BILITOT 0.7 11/30/2021   ALKPHOS 311 (H) 11/30/2021   AST 32 11/30/2021   ALT 156 (H) 11/30/2021   ANIONGAP 9 12/03/2021    CBG (last 3)  No results for input(s): GLUCAP in the last 72 hours.   GFR: Estimated Creatinine Clearance: 29.6 mL/min (A) (by C-G formula based on SCr of 1.98 mg/dL (H)).  Coagulation Profile: No results for input(s): INR, PROTIME in the last 168 hours.  Recent Results (from the past 240 hour(s))  Blood culture (routine x 2)     Status: Abnormal (Preliminary result)   Collection Time: 11/27/21 12:20 PM   Specimen: BLOOD  Result Value Ref Range Status   Specimen Description   Final    BLOOD RIGHT ANTECUBITAL Performed at [facility] [address], [place], [place] [address]    Special Requests   Final    BOTTLES  DRAWN AEROBIC AND ANAEROBIC Blood Culture adequate volume Performed at [facility] [address], [place], [place] [address]    Culture  Setup Time (A)  Final    YEAST AEROBIC BOTTLE ONLY CRITICAL RESULT CALLED TO, READ BACK BY AND VERIFIED WITH: PHARM D M.TUCKER ON [id number] BY E.PARRISH    Culture (A)  Final    CANDIDA LUSITANIAE Sent to [place] for further susceptibility testing. Performed at [facility], [place] [address] [place], [place] [address]    Report Status PENDING  Incomplete  Blood  Culture ID Panel (Reflexed)     Status: None   Collection Time: 11/27/21 12:20 PM  Result Value Ref Range Status   Enterococcus faecalis NOT DETECTED NOT DETECTED Final   Enterococcus Faecium NOT DETECTED NOT DETECTED Final   Listeria monocytogenes NOT DETECTED NOT DETECTED Final   Staphylococcus species NOT DETECTED NOT DETECTED Final   Staphylococcus aureus (BCID) NOT DETECTED NOT DETECTED Final   Staphylococcus epidermidis NOT DETECTED NOT DETECTED Final   Staphylococcus lugdunensis NOT DETECTED NOT DETECTED Final   Streptococcus species NOT DETECTED NOT DETECTED Final   Streptococcus agalactiae NOT DETECTED NOT DETECTED Final   Streptococcus pneumoniae NOT DETECTED NOT DETECTED Final   Streptococcus pyogenes NOT DETECTED NOT DETECTED Final   A.calcoaceticus-baumannii NOT DETECTED NOT DETECTED Final   Bacteroides fragilis NOT DETECTED NOT DETECTED Final   Enterobacterales NOT DETECTED NOT DETECTED Final   Enterobacter cloacae complex NOT DETECTED NOT DETECTED Final   Escherichia coli NOT DETECTED NOT DETECTED Final   Klebsiella aerogenes NOT DETECTED NOT DETECTED Final   Klebsiella oxytoca NOT DETECTED NOT DETECTED Final   Klebsiella pneumoniae NOT DETECTED NOT DETECTED Final   Proteus species NOT DETECTED NOT DETECTED Final   Salmonella species NOT DETECTED NOT DETECTED Final   Serratia marcescens NOT DETECTED NOT DETECTED Final   Haemophilus influenzae NOT DETECTED NOT DETECTED Final   Neisseria meningitidis NOT DETECTED NOT DETECTED Final   Pseudomonas aeruginosa NOT DETECTED NOT DETECTED Final   Stenotrophomonas maltophilia NOT DETECTED NOT DETECTED Final   Candida albicans NOT DETECTED NOT DETECTED Final   Candida auris NOT DETECTED NOT DETECTED Final   Candida glabrata NOT DETECTED NOT DETECTED Final   Candida krusei NOT DETECTED NOT DETECTED Final   Candida parapsilosis NOT DETECTED NOT DETECTED Final   Candida tropicalis NOT DETECTED NOT DETECTED Final   Cryptococcus  neoformans/gattii NOT DETECTED NOT DETECTED Final    Comment: Performed at [facility], [address] [place], [place] [address]  Antifungal AST 9 Drug Panel     Status: None (Preliminary result)   Collection Time: 11/27/21 12:20 PM  Result Value Ref Range Status   Organism ID, Yeast Preliminary report  Final    Comment: (NOTE) Specimen has been received and testing has been initiated. Performed At: [facility] [place], [place] [id number] Rush Farmer MD DQ:[id number]    Amphotericin B MIC PENDING  Incomplete   Anidulafungin MIC PENDING  Incomplete   Caspofungin MIC PENDING  Incomplete   Micafungin MIC PENDING  Incomplete   Posaconazole MIC PENDING  Incomplete   Fluconazole Islt MIC PENDING  Incomplete   Flucytosine MIC PENDING  Incomplete   Itraconazole MIC PENDING  Incomplete   Voriconazole MIC PENDING  Incomplete   Source   Final    [id number] C.LUSITANIAE 9 DRUGS PANEL ANTIFUNGAL BLOOD CULTURE    Comment: Performed at [facility], [place] [address], [place], [place]  [address]  Blood culture (routine x 2)     Status: Abnormal (Preliminary result)   Collection Time: 11/27/21  9:26 PM   Specimen: BLOOD  Result Value Ref Range Status   Specimen Description   Final    BLOOD BLOOD LEFT HAND Performed at [facility] [address], [place], [place] [address]    Special Requests   Final    BOTTLES DRAWN AEROBIC ONLY Blood Culture adequate volume Performed at [facility] [address] [place], [place] [address]    Culture  Setup Time   Final    YEAST AEROBIC BOTTLE ONLY CRITICAL VALUE NOTED.  VALUE IS CONSISTENT WITH PREVIOUSLY REPORTED AND CALLED VALUE. Performed at [facility], [place] [address] [place], [name] [address]    Culture CANDIDA LUSITANIAE (A)  Final   Report Status PENDING  Incomplete  Gastrointestinal Panel by PCR , Stool     Status: None   Collection Time: 11/28/21  1:30 AM    Specimen: STOOL  Result Value Ref Range Status   Campylobacter species NOT DETECTED NOT DETECTED Final   Plesimonas shigelloides NOT DETECTED NOT DETECTED Final   Salmonella species NOT DETECTED NOT DETECTED Final   Yersinia enterocolitica NOT DETECTED NOT DETECTED Final   Vibrio species NOT DETECTED NOT DETECTED Final   Vibrio cholerae NOT DETECTED NOT DETECTED Final   Enteroaggregative E coli (EAEC) NOT DETECTED NOT DETECTED Final   Enteropathogenic E coli (EPEC) NOT DETECTED NOT DETECTED Final   Enterotoxigenic E coli (ETEC) NOT DETECTED NOT DETECTED Final   Shiga like toxin producing E coli (STEC) NOT DETECTED NOT DETECTED Final   Shigella/Enteroinvasive E coli (EIEC) NOT DETECTED NOT DETECTED Final   Cryptosporidium NOT DETECTED NOT DETECTED Final   Cyclospora cayetanensis NOT DETECTED NOT DETECTED Final   Entamoeba histolytica NOT DETECTED NOT DETECTED Final   Giardia lamblia NOT DETECTED NOT DETECTED Final   Adenovirus F40/41 NOT DETECTED NOT DETECTED Final   Astrovirus NOT DETECTED NOT DETECTED Final   Norovirus GI/GII NOT DETECTED NOT DETECTED Final   Rotavirus A NOT DETECTED NOT DETECTED Final   Sapovirus (I, II, IV, and V) NOT DETECTED NOT DETECTED Final    Comment: Performed at [facility], [place] [place], [place] [address]  C Difficile Quick Screen w PCR reflex     Status: None   Collection Time: 11/28/21  1:30 AM   Specimen: STOOL  Result Value Ref Range Status   C Diff antigen NEGATIVE NEGATIVE Final   C Diff toxin NEGATIVE NEGATIVE Final   C Diff interpretation No C. difficile detected.  Final    Comment: Performed at [facility], [place] [address], [place], [place] [address]  Culture, blood (routine x 2)     Status: None (Preliminary result)   Collection Time: 11/29/21  6:15 AM   Specimen: BLOOD RIGHT HAND  Result Value Ref Range Status   Specimen Description   Final    BLOOD RIGHT HAND Performed at [facility] [address], [place], [place] [address]    Special Requests   Final    BOTTLES DRAWN AEROBIC ONLY Blood Culture adequate volume Performed at [facility] [address], [place], [place] [address]    Culture   Final    NO GROWTH 4 DAYS Performed at [facility], [place] [address], [place], [place] [address]    Report Status PENDING  Incomplete  Culture,  blood (routine x 2)     Status: None (Preliminary result)   Collection Time: 11/29/21  6:16 AM   Specimen: BLOOD  Result Value Ref Range Status   Specimen Description   Final    BLOOD Performed at [facility] [address], [place], [place] [address]    Special Requests   Final    BOTTLES DRAWN AEROBIC ONLY Blood Culture results may not be optimal due to an inadequate volume of blood received in culture bottles Performed at [facility] [address] [place], [place] [address]    Culture   Final    NO GROWTH 4 DAYS Performed at [facility], [place] [address], [place], [place] [address]    Report Status PENDING  Incomplete        Radiology Studies: DG Abd Portable 1V  Result Date: 12/02/2021 CLINICAL DATA:  Abdominal pain. Colonoscopy 3 days ago. History of Crohn's disease. EXAM: PORTABLE ABDOMEN - 1 VIEW COMPARISON:  07/08/2021 and older exams.  CT, 11/27/2021. FINDINGS: Mildly dilated small bowel in the central abdomen. Air is seen within normal caliber colon. Bowel anastomosis staples noted in the upper and left mid abdomen, stable. IMPRESSION: 1. Mildly dilated small bowel, nonspecific. This may reflect a mild adynamic ileus. Low-grade partial small bowel obstruction not excluded but felt less likely. Electronically Signed   By: Lajean Manes M.D.   On: 12/02/2021 14:53        Scheduled Meds:  amLODipine  10 mg Oral Daily   buPROPion ER  200 mg Oral Daily   calcium carbonate  1,250 mg Oral Q breakfast   carvedilol  12.5 mg Oral BID WC    Chlorhexidine Gluconate Cloth  6 each Topical Daily   cholecalciferol  1,000 Units Oral Daily   cycloSPORINE  1 drop Both Eyes BID   DULoxetine  120 mg Oral Daily   estradiol  2 mg Oral Daily   feeding supplement  1 Container Oral TID BM   heparin injection (subcutaneous)  5,000 Units Subcutaneous Q8H   hydrALAZINE  100 mg Oral TID   HYDROmorphone  4 mg Oral QID   levETIRAcetam  500 mg Oral BID   lipase/protease/amylase  36,000 Units Oral TID with meals   methadone  5 mg Oral 5 X Daily   methocarbamol  500 mg Oral BID   multivitamin with minerals   Oral Daily   pantoprazole  40 mg Oral Daily   sodium chloride flush  10-40 mL Intracatheter Q12H   sucralfate  1 g Oral BID AC   Teduglutide (rDNA)  1.5 mg Subcutaneous Daily   vitamin B-12  1,000 mcg Oral Daily   Continuous Infusions:  dextrose 5 % and 0.45% NaCl 100 mL/hr at 12/04/21 0322   fluconazole (DIFLUCAN) IV Stopped (12/03/21 1032)     LOS: 7 days     Cordelia Poche, MD [facility] 12/04/2021, 8:53 AM  If 7PM-7AM, please contact night-coverage [email or web address]

## 2021-12-04 NOTE — Plan of Care (Signed)
  Problem: Health Behavior/Discharge Planning: Goal: Ability to manage health-related needs will improve Outcome: Progressing   Problem: Clinical Measurements: Goal: Ability to maintain clinical measurements within normal limits will improve Outcome: Progressing   Problem: Activity: Goal: Risk for activity intolerance will decrease Outcome: Progressing   Problem: Nutrition: Goal: Adequate nutrition will be maintained Outcome: Progressing   Problem: Elimination: Goal: Will not experience complications related to bowel motility Outcome: Progressing   Problem: Pain Managment: Goal: General experience of comfort will improve Outcome: Progressing   Problem: Safety: Goal: Ability to remain free from injury will improve Outcome: Progressing   Problem: Skin Integrity: Goal: Risk for impaired skin integrity will decrease Outcome: Progressing   

## 2021-12-05 ENCOUNTER — Inpatient Hospital Stay (HOSPITAL_COMMUNITY): Payer: Medicare Other

## 2021-12-05 HISTORY — PX: IR US GUIDE VASC ACCESS LEFT: IMG2389

## 2021-12-05 HISTORY — PX: IR PERC TUN PERIT CATH WO PORT S&I /IMAG: IMG2327

## 2021-12-05 MED ORDER — MIDAZOLAM HCL 2 MG/2ML IJ SOLN
INTRAMUSCULAR | Status: AC
  Filled 2021-12-05: qty 2

## 2021-12-05 MED ORDER — FENTANYL CITRATE (PF) 100 MCG/2ML IJ SOLN
INTRAMUSCULAR | Status: AC
  Filled 2021-12-05: qty 2

## 2021-12-05 MED ORDER — MIDAZOLAM HCL 2 MG/2ML IJ SOLN
INTRAMUSCULAR | Status: AC | PRN
Start: 2021-12-05 — End: 2021-12-05
  Administered 2021-12-05 (×2): 1 mg via INTRAVENOUS

## 2021-12-05 MED ORDER — SODIUM CHLORIDE 0.9% FLUSH: 10.0000 mL | Freq: Two times a day (BID) | INTRAVENOUS | Status: DC

## 2021-12-05 MED ORDER — SODIUM CHLORIDE 0.9% FLUSH: 10.0000 mL | INTRAVENOUS | Status: DC | PRN

## 2021-12-05 MED ORDER — LIDOCAINE-EPINEPHRINE 1 %-1:100000 IJ SOLN
INTRAMUSCULAR | Status: AC
  Filled 2021-12-05: qty 1

## 2021-12-05 MED ORDER — FENTANYL CITRATE (PF) 100 MCG/2ML IJ SOLN
INTRAMUSCULAR | Status: AC | PRN
  Administered 2021-12-05 (×2): 50 ug via INTRAVENOUS

## 2021-12-05 NOTE — Progress Notes (Deleted)
Pt noticed to have bright red, bloody sputum on his sheets and dried on his mouth and face. Pt also had an episode earlier in the shift of vomiting dark brown emesis. VSS. Pt does not appear to be in any distress at this time. Respirations regular and unlabored. Notified J. Olena Heckle NP. Verbal order to hold any tube feedings or supplements. All medications crushed and given in small amount of applesauce and pt tolerated well. Will continue to monitor.

## 2021-12-05 NOTE — Procedures (Signed)
Interventional Radiology Procedure:   Indications: Short gut syndrome, needs new tunneled central venous catheter  Procedure: Placement of tunneled central line  Findings: Left common femoral vein dual lumen Powerline, tip at IVC/RA junction, length 48 cm  Complications: No immediate complications noted.     EBL: Minimal  Plan: Central line is ready to use.     Mayre Bury R. Anselm Pancoast, MD  Pager: [phone]

## 2021-12-05 NOTE — Progress Notes (Signed)
PROGRESS NOTE    Caitlin Peters  SUP:[id number] DOB: 1961-03-22 DOA: 11/27/2021 PCP: [name], [name], MD   Brief Narrative: Caitlin Peters is a 61 y.o. 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 Caitlin Peters presented secondary to abdominal pain, vomiting and fever with concern for Crohn flare but found to have associated Candidemia. Eraxis started with transition to Fluconazole IV. TPN held and PICC line removed on 1/4. Colonoscopy on 1/4 negative for evidence of Crohn flare. Repeat blood cultures no growth to date. Plan for PICC reinsertion 1/9.   Assessment & Plan:   * 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[place]) Colonoscopy not consistent with Crohn flare. GI consulted. GI pathogen panel and C. Difficile ordered but no sample to date. Previously on budesonide but discontinued in 2022.  Nausea & vomiting- (present on admission) Occurring 1/6. Unsure of etiology. Associated abdominal pain but this is also a chronic issue. Caitlin Peters vomited food from the whole day. Abdominal x-ray with possible ileus. Now appears resolved. 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 In setting of tunneled IV for chronic TPN. Initial blood cultures significant for Candida Lusitaniae. Caitlin Peters empirically started on Eraxis. Infectious disease on board and Caitlin Peters transitioned to fluconazole 400 mg x 1 dose tapering to fluconazole 200 mg daily. Transthoracic Echocardiogram (1/4) obtained and negative for evidence of valvular vegetations. Repeat blood culture (1/4) with no growth to date. -Infectious disease recommendations (1/5): Continue fluconazole with plan for 2 weeks of treatment (1/4 - 1/18), outpatient ophthalmology followup, await susceptibilities,  outpatient ID follow-up on 1/31 at 11:30 AM.  Sepsis ([place])- (present on admission) Present on admission. Secondary to Candidemia.  Anemia- (present on admission) Chronic issue. Hemoglobin drifted down. No overt bleeding noted. Previous iron panel in 6/22 was normal, although ferritin was on the low end of normal. Associated normocytosis. Caitlin Peters possibly with hemoconcentration on admission. Hemoglobin stable. -Transfuse for hemoglobin <7  LFTs abnormal- (present on admission) Chronic. 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  AKI (acute kidney injury) ([place])- (present on admission) Baseline creatinine of about 1.5-1.6. Creatinine of 2.29 no admission. Likely secondary to decreased oral intake and diarrhea with suspected Crohn flare. No hydronephrosis on CT imaging. Initially improved with IV fluids with slight bump today likely related to emesis over the weekend -Continue IV fluids while off of TPN -D5 1/2 NS @ 100 mL/hr  PICC (peripherally inserted central catheter) in place Limited IV access. Initially with right femoral vein PICC line which was removed on 1/4 by IR secondary to candidemia. Plan for PICC reinsertion 1/9 but rescheduled to 1/10.  Seizures ([place]) -Continue Keppra  Chronic kidney disease, stage 3b ([place])- (present on admission) Baseline creatinine 1.5-1.6  Abdominal pain- (present on admission) Secondary to colitis. Negative C. Difficile/GI pathogen panel testing. Colonoscopy performed on 1/4 without evidence of active Crohn. Abdominal pain is improved and Caitlin Peters currently has her chronic pain.  Chronic pain syndrome- (present on admission) -Continue home meds (methadone, dilaudid PO) with prn IV breakthrough  Depression- (present on admission) -Continue Wellbutrin and Cymbalta  Crohn's disease ([place]) Hx crohn's with prior resection. No  active flare.  Hypertension- (present on admission) -Continue Amlodipine, Coreg and hydralazine  Bacteremia associated with intravascular line ([name])-resolved  as of 11/29/2021, (present on admission) Caitlin Peters treated on previous admission and completed antibiotic course on 11/27/21. At that time, blood culture positive for stenotrophomonas maltophilia and pseudomonas putida. Repeat blood culture this admission shows treatment of bacteremia.  Pancreatic insufficiency -Continue Creon  Vitamin B12 deficiency- (present on admission) -Continue B12 supplementation  Short gut syndrome- (present on admission) -Continue teduglutide -TPN held secondary to line holiday; restarting TPN pending PICC placement    DVT prophylaxis: Heparin subq Code Status:   Code Status: Full Code Family Communication: None at bedside Disposition Plan: Discharge likely in 1 day pending replacement of PICC and resumption of TPN.   Consultants:  [facility] Infectious disease Interventional radiology  Procedures:  TRANSTHORACIC ECHOCARDIOGRAM (11/29/2021) IMPRESSIONS     1. Left ventricular ejection fraction, by estimation, is 65 to 70%. Left  ventricular ejection fraction by 3D volume is 67 %. The left ventricle has  normal function. The left ventricle has no regional wall motion  abnormalities. There is moderate left  ventricular hypertrophy. Left ventricular diastolic parameters are  consistent with Grade I diastolic dysfunction (impaired relaxation). The  average left ventricular global longitudinal strain is -24.9 %. The global  longitudinal strain is normal.   2. Right ventricular systolic function is normal. The right ventricular  size is normal. There is normal pulmonary artery systolic pressure.   3. Left atrial size was severely dilated.   4. The mitral valve is grossly normal. Mild mitral valve regurgitation.   5. The aortic valve is tricuspid. There is mild calcification of the  aortic  valve. There is mild thickening of the aortic valve. Aortic valve  regurgitation is not visualized. Aortic valve sclerosis is present, with  no evidence of aortic valve stenosis.   6. The inferior vena cava is dilated in size with >50% respiratory  variability, suggesting right atrial pressure of 8 mmHg.   COLONOSCOPY (11/29/2021) Impression:               - Patent end-to-side ileo-colonic anastomosis,                            characterized by healthy appearing mucosa.                           - The examined portion of the ileum was normal.                           - Tortuous colon.                           - Stool in the entire examined colon which was                            lavaged.                           - Internal hemorrhoids.                           - The examination was otherwise normal.                           No endoscopic evidence of any active Crohn's  disease. In this light would not recommend any                            steroids / immunosuppression for Crohn's disease.                            Unclear if functional symptoms at this point.                            Caitlin Peters noted to have "intolerance" listed to                            Levsin, consider trial of Bentyl or IB gard.                            Caitlin Peters overall improved from admission symptoms at                            this time. On antifungal regimen per ID for                            candidemia / recurrent line infection - management                            per ID, line to be changed.  Recommendation:           - Return Caitlin Peters to hospital ward for ongoing care.                           - Advance diet as tolerated.                           - Continue present medications.                           - No active Crohn's disease - advance diet as                            tolerated. No plans for steroids /                            immunosuppression at  this time                           - Management of recurrent line infection /                            candidemia per ID                           - Caitlin Peters can follow up with [facility] clinic for                            short gut syndrome post discharge                           -  GI signing off for now, please call with                            questions moving forward   Antimicrobials: Levaquin IV Eraxis Fluconazole IV    Subjective: No issues this morning except for odd taste in her mouth  Objective: Vitals:   12/05/21 0449 12/05/21 1033 12/05/21 1045 12/05/21 1050  BP: 135/62 (!) 162/72 (!) 151/65 (!) 148/70  Pulse: 64 64 63 64  Resp: 16 13 13 12   Temp: 98 F (36.7 C)     TempSrc: Oral     SpO2: 99% 100% 100% 100%  Weight:      Height:        Intake/Output Summary (Last 24 hours) at 12/05/2021 1058 Last data filed at 12/05/2021 0200 Gross per 24 hour  Intake 2103.87 ml  Output --  Net 2103.87 ml    Filed Weights   11/27/21 1225 11/29/21 1234 12/04/21 1541  Weight: 61.2 kg 62 kg 56.8 kg    Examination:  General exam: Appears calm and comfortable  HEENT: no thrush Respiratory system: Clear to auscultation. Respiratory effort normal. Cardiovascular system: S1 & S2 heard, RRR. Gastrointestinal system: Abdomen is nondistended, soft. Normal bowel sounds heard. Central nervous system: Alert and oriented. No focal neurological deficits. Musculoskeletal: No edema. No calf tenderness Skin: No cyanosis. No rashes Psychiatry: Judgement and insight appear normal. Mood & affect appropriate.     Data Reviewed: I have personally reviewed following labs and imaging studies  CBC Lab Results  Component Value Date   WBC 3.1 (L) 12/03/2021   RBC 2.46 (L) 12/03/2021   HGB 7.5 (L) 12/03/2021   HCT 23.0 (L) 12/03/2021   MCV 93.5 12/03/2021   MCH 30.5 12/03/2021   PLT 209 12/03/2021   MCHC 32.6 12/03/2021   RDW 13.1 12/03/2021   LYMPHSABS 0.3 (L) 10/08/2021    MONOABS 0.2 10/08/2021   EOSABS 0.0 10/08/2021   BASOSABS 0.0 38/46/6599     Last metabolic panel Lab Results  Component Value Date   NA 137 12/03/2021   K 3.3 (L) 12/03/2021   CL 108 12/03/2021   CO2 20 (L) 12/03/2021   BUN 15 12/03/2021   CREATININE 1.98 (H) 12/04/2021   GLUCOSE 90 12/03/2021   GFRNONAA 28 (L) 12/04/2021   GFRAA 55 (L) 08/12/2020   CALCIUM 9.1 12/03/2021   PHOS 3.9 11/28/2021   PROT 5.6 (L) 11/30/2021   ALBUMIN 2.3 (L) 11/30/2021   BILITOT 0.7 11/30/2021   ALKPHOS 311 (H) 11/30/2021   AST 32 11/30/2021   ALT 156 (H) 11/30/2021   ANIONGAP 9 12/03/2021    CBG (last 3)  No results for input(s): GLUCAP in the last 72 hours.   GFR: Estimated Creatinine Clearance: 27.1 mL/min (A) (by C-G formula based on SCr of 1.98 mg/dL (H)).  Coagulation Profile: No results for input(s): INR, PROTIME in the last 168 hours.  Recent Results (from the past 240 hour(s))  Blood culture (routine x 2)     Status: Abnormal (Preliminary result)   Collection Time: 11/27/21 12:20 PM   Specimen: BLOOD  Result Value Ref Range Status   Specimen Description   Final    BLOOD RIGHT ANTECUBITAL Performed at [facility] [address], [place], [place] [address]    Special Requests   Final    BOTTLES DRAWN AEROBIC AND ANAEROBIC Blood Culture adequate volume Performed at [facility], [address] [place], [place] [address]    Culture  Setup Time (A)  Final    YEAST AEROBIC BOTTLE ONLY CRITICAL RESULT CALLED TO, READ BACK BY AND VERIFIED WITH: PHARM D M.TUCKER ON [id number] BY E.PARRISH    Culture (A)  Final    CANDIDA LUSITANIAE Sent to [place] for further susceptibility testing. Performed at [facility], [place] [address], [place], [place] [address]    Report Status PENDING  Incomplete  Blood Culture ID Panel (Reflexed)     Status: None   Collection Time: 11/27/21 12:20 PM  Result Value Ref Range Status   Enterococcus faecalis  NOT DETECTED NOT DETECTED Final   Enterococcus Faecium NOT DETECTED NOT DETECTED Final   Listeria monocytogenes NOT DETECTED NOT DETECTED Final   Staphylococcus species NOT DETECTED NOT DETECTED Final   Staphylococcus aureus (BCID) NOT DETECTED NOT DETECTED Final   Staphylococcus epidermidis NOT DETECTED NOT DETECTED Final   Staphylococcus lugdunensis NOT DETECTED NOT DETECTED Final   Streptococcus species NOT DETECTED NOT DETECTED Final   Streptococcus agalactiae NOT DETECTED NOT DETECTED Final   Streptococcus pneumoniae NOT DETECTED NOT DETECTED Final   Streptococcus pyogenes NOT DETECTED NOT DETECTED Final   A.calcoaceticus-baumannii NOT DETECTED NOT DETECTED Final   Bacteroides fragilis NOT DETECTED NOT DETECTED Final   Enterobacterales NOT DETECTED NOT DETECTED Final   Enterobacter cloacae complex NOT DETECTED NOT DETECTED Final   Escherichia coli NOT DETECTED NOT DETECTED Final   Klebsiella aerogenes NOT DETECTED NOT DETECTED Final   Klebsiella oxytoca NOT DETECTED NOT DETECTED Final   Klebsiella pneumoniae NOT DETECTED NOT DETECTED Final   Proteus species NOT DETECTED NOT DETECTED Final   Salmonella species NOT DETECTED NOT DETECTED Final   Serratia marcescens NOT DETECTED NOT DETECTED Final   Haemophilus influenzae NOT DETECTED NOT DETECTED Final   Neisseria meningitidis NOT DETECTED NOT DETECTED Final   Pseudomonas aeruginosa NOT DETECTED NOT DETECTED Final   Stenotrophomonas maltophilia NOT DETECTED NOT DETECTED Final   Candida albicans NOT DETECTED NOT DETECTED Final   Candida auris NOT DETECTED NOT DETECTED Final   Candida glabrata NOT DETECTED NOT DETECTED Final   Candida krusei NOT DETECTED NOT DETECTED Final   Candida parapsilosis NOT DETECTED NOT DETECTED Final   Candida tropicalis NOT DETECTED NOT DETECTED Final   Cryptococcus neoformans/gattii NOT DETECTED NOT DETECTED Final    Comment: Performed at [facility], [address], [place], [place] [address]   Antifungal AST 9 Drug Panel     Status: None   Collection Time: 11/27/21 12:20 PM  Result Value Ref Range Status   Organism ID, Yeast Candida lusitaniae  Corrected    Comment: (NOTE) Identification performed by account, not confirmed by this laboratory. CORRECTED ON 01/09 AT 1136: PREVIOUSLY REPORTED AS Preliminary report    Amphotericin B MIC 0.5 ug/mL  Final    Comment: (NOTE) Breakpoints have been established for only some organism-drug combinations as indicated. This test was developed and its performance characteristics determined by [facility]. It has not been cleared or approved by the Food and Drug Administration.    Anidulafungin MIC 0.25 ug/mL  Final    Comment: (NOTE) Breakpoints have been established for only some organism-drug combinations as indicated. This test was developed and its performance characteristics determined by [facility]. It has not been cleared or approved by the Food and Drug Administration.    Caspofungin MIC 0.5 ug/mL  Final    Comment: (NOTE) Breakpoints have been established for only some organism-drug  combinations as indicated. This test was developed and its performance characteristics determined by [facility]. It has not been cleared or approved by the Food and Drug Administration.    Micafungin MIC 0.12 ug/mL  Final    Comment: (NOTE) Breakpoints have been established for only some organism-drug combinations as indicated. This test was developed and its performance characteristics determined by [facility]. It has not been cleared or approved by the Food and Drug Administration.    Posaconazole MIC 0.06 ug/mL  Final    Comment: (NOTE) Breakpoints have been established for only some organism-drug combinations as indicated. This test was developed and its performance characteristics determined by [facility]. It has not been cleared or approved by the Food and Drug Administration.    Fluconazole Islt MIC 0.5 ug/mL  Final    Comment:  (NOTE) Breakpoints have been established for only some organism-drug combinations as indicated. This test was developed and its performance characteristics determined by [facility]. It has not been cleared or approved by the Food and Drug Administration.    Flucytosine MIC 64.0 ug/mL  Final    Comment: (NOTE) Breakpoints have been established for only some organism-drug combinations as indicated. This test was developed and its performance characteristics determined by [facility]. It has not been cleared or approved by the Food and Drug Administration.    Itraconazole MIC 0.25 ug/mL  Final    Comment: (NOTE) Breakpoints have been established for only some organism-drug combinations as indicated. This test was developed and its performance characteristics determined by [facility]. It has not been cleared or approved by the Food and Drug Administration.    Voriconazole MIC 0.016 ug/mL  Final    Comment: (NOTE) Breakpoints have been established for only some organism-drug combinations as indicated. This test was developed and its performance characteristics determined by [facility]. It has not been cleared or approved by the Food and Drug Administration. Performed At: [facility] [place], [place] [id number] Rush Farmer MD MV:[id number]    Source   Final    [phone] C.LUSITANIAE 9 DRUGS PANEL ANTIFUNGAL BLOOD CULTURE    Comment: Performed at [facility], [place] [address], [place], [place] [address]  Blood culture (routine x 2)     Status: Abnormal (Preliminary result)   Collection Time: 11/27/21  9:26 PM   Specimen: BLOOD  Result Value Ref Range Status   Specimen Description   Final    BLOOD BLOOD LEFT HAND Performed at [facility] [address] [place], [place] [address]    Special Requests   Final    BOTTLES DRAWN AEROBIC ONLY Blood Culture adequate volume Performed at [facility] [address],  [place],  [address]    Culture  Setup Time   Final    YEAST AEROBIC BOTTLE ONLY CRITICAL VALUE NOTED.  VALUE IS CONSISTENT WITH PREVIOUSLY REPORTED AND CALLED VALUE. Performed at [facility], [place] [address] [place],  [address]    Culture CANDIDA LUSITANIAE (A)  Final   Report Status PENDING  Incomplete  Gastrointestinal Panel by PCR , Stool     Status: None   Collection Time: 11/28/21  1:30 AM   Specimen: STOOL  Result Value Ref Range Status   Campylobacter species NOT DETECTED NOT DETECTED Final   Plesimonas shigelloides NOT DETECTED NOT DETECTED Final   Salmonella species NOT DETECTED NOT DETECTED Final   Yersinia enterocolitica NOT DETECTED NOT DETECTED Final   Vibrio species NOT DETECTED NOT DETECTED Final  Vibrio cholerae NOT DETECTED NOT DETECTED Final   Enteroaggregative E coli (EAEC) NOT DETECTED NOT DETECTED Final   Enteropathogenic E coli (EPEC) NOT DETECTED NOT DETECTED Final   Enterotoxigenic E coli (ETEC) NOT DETECTED NOT DETECTED Final   Shiga like toxin producing E coli (STEC) NOT DETECTED NOT DETECTED Final   Shigella/Enteroinvasive E coli (EIEC) NOT DETECTED NOT DETECTED Final   Cryptosporidium NOT DETECTED NOT DETECTED Final   Cyclospora cayetanensis NOT DETECTED NOT DETECTED Final   Entamoeba histolytica NOT DETECTED NOT DETECTED Final   Giardia lamblia NOT DETECTED NOT DETECTED Final   Adenovirus F40/41 NOT DETECTED NOT DETECTED Final   Astrovirus NOT DETECTED NOT DETECTED Final   Norovirus GI/GII NOT DETECTED NOT DETECTED Final   Rotavirus A NOT DETECTED NOT DETECTED Final   Sapovirus (I, II, IV, and V) NOT DETECTED NOT DETECTED Final    Comment: Performed at [facility], [place] [place], [place] [address]  C Difficile Quick Screen w PCR reflex     Status: None   Collection Time: 11/28/21  1:30 AM   Specimen: STOOL  Result Value Ref Range Status   C Diff antigen NEGATIVE NEGATIVE Final   C Diff toxin NEGATIVE NEGATIVE  Final   C Diff interpretation No C. difficile detected.  Final    Comment: Performed at [facility], [place] [address] [place], [place] [address]  Culture, blood (routine x 2)     Status: None   Collection Time: 11/29/21  6:15 AM   Specimen: BLOOD RIGHT HAND  Result Value Ref Range Status   Specimen Description   Final    BLOOD RIGHT HAND Performed at [facility] [address], [place], [place] [address]    Special Requests   Final    BOTTLES DRAWN AEROBIC ONLY Blood Culture adequate volume Performed at [facility] [address] [place], [place] [address]    Culture   Final    NO GROWTH 5 DAYS Performed at [facility], [place] [address] [place], [place] [address]    Report Status 12/04/2021 FINAL  Final  Culture, blood (routine x 2)     Status: None   Collection Time: 11/29/21  6:16 AM   Specimen: BLOOD  Result Value Ref Range Status   Specimen Description   Final    BLOOD Performed at [facility] [address] [place], [place] [address]    Special Requests   Final    BOTTLES DRAWN AEROBIC ONLY Blood Culture results may not be optimal due to an inadequate volume of blood received in culture bottles Performed at [facility] [address], [place], [place] [address]    Culture   Final    NO GROWTH 5 DAYS Performed at [facility], [place] [address], [place], [place] [address]    Report Status 12/04/2021 FINAL  Final        Radiology Studies: No results found.      Scheduled Meds:  amLODipine  10 mg Oral Daily   buPROPion ER  200 mg Oral Daily   calcium carbonate  1,250 mg Oral Q breakfast   carvedilol  12.5 mg Oral BID WC   Chlorhexidine Gluconate Cloth  6 each Topical Daily   cholecalciferol  1,000 Units Oral Daily   cycloSPORINE  1 drop Both Eyes BID   DULoxetine  120 mg Oral Daily   estradiol  2 mg Oral Daily  feeding supplement  1 Container  Oral TID BM   fentaNYL       heparin injection (subcutaneous)  5,000 Units Subcutaneous Q8H   hydrALAZINE  100 mg Oral TID   HYDROmorphone  4 mg Oral QID   levETIRAcetam  500 mg Oral BID   lidocaine-EPINEPHrine       lipase/protease/amylase  36,000 Units Oral TID with meals   methadone  5 mg Oral 5 X Daily   methocarbamol  500 mg Oral BID   midazolam       multivitamin with minerals   Oral Daily   pantoprazole  40 mg Oral Daily   sodium chloride flush  10-40 mL Intracatheter Q12H   sucralfate  1 g Oral BID AC   Teduglutide (rDNA)  1.5 mg Subcutaneous Daily   vitamin B-12  1,000 mcg Oral Daily   Continuous Infusions:  dextrose 5 % and 0.45% NaCl 100 mL/hr at 12/05/21 0133   fluconazole (DIFLUCAN) IV 200 mg (12/05/21 0940)     LOS: 8 days     Cordelia Poche, MD [facility] 12/05/2021, 10:58 AM  If 7PM-7AM, please contact night-coverage [email or web address]

## 2021-12-05 NOTE — Care Management Important Message (Signed)
Important Message  Patient Details IM Letter given to the Patient. Name: Caitlin Peters MRN: [id number] Date of Birth: 11-23-1961   Medicare Important Message Given:  Yes     [name] 12/05/2021, 11:53 AM

## 2021-12-06 MED ORDER — FLUCONAZOLE IV FOR PTA / DISCHARGE USE ONLY): 200.0000 mg | INTRAVENOUS | 0 refills | Status: AC

## 2021-12-06 MED ORDER — HEPARIN SOD (PORK) LOCK FLUSH 100 UNIT/ML IV SOLN
250.0000 [IU] | INTRAVENOUS | Status: AC | PRN
  Administered 2021-12-06: 250 [IU]
  Filled 2021-12-06: qty 2.5

## 2021-12-06 NOTE — Plan of Care (Signed)

## 2021-12-06 NOTE — TOC Transition Note (Signed)
Transition of Care [facility]) - CM/SW Discharge Note   Patient Details  Name: Caitlin Peters MRN: [id number] Date of Birth: June 13, 1961  Transition of Care [facility]) CM/SW Contact:  Lennart Pall, LCSW Phone Number: 12/06/2021, 1:26 PM   Clinical Narrative:    Pt cleared for dc home today.  All HH and TPN services in place with [facility] and [employer] resuming coverage.  Pt reports daughter, Darleene Cleaver, will be providing dc transportation.  No further TOC needs.   Final next level of care: [place] Barriers to Discharge: Barriers Resolved   Patient Goals and CMS Choice Patient states their goals for this hospitalization and ongoing recovery are:: return home      Discharge Placement                       Discharge Plan and Services     Post Acute Care Choice: [place] Arranged: RN, TPN HH Agency: Tour manager, Other - See comment ([facility] [place]) Date HH Agency Contacted: 12/06/21   Representative spoke with at [place]: [place] and [place] Determinants of Health (SDOH) Interventions     Readmission Risk Interventions Readmission Risk Prevention Plan 11/28/2021 09/26/2021 06/22/2021  Transportation Screening Complete Complete Complete  PCP or Specialist Appt within 3-5 Days - - -  Not Complete comments - - -  HRI or [place] Work Consult for [place] Planning/Counseling - - -  SW consult not completed comments - - -  Palliative Care Screening - - -  Medication Review Press photographer) Complete Complete Complete  PCP or Specialist appointment within 3-5 days of discharge Complete - Complete  HRI or Home Care Consult Complete Complete Complete  SW Recovery Care/Counseling Consult - Complete Complete  Palliative Care Screening Not Applicable Not Applicable Not [place] Not Applicable Not Applicable Not Applicable

## 2021-12-06 NOTE — Progress Notes (Signed)
Provided discharge education/instructions, all questions and concerns addressed, PICC capped by IV team. Pt to discharge home with belongings accompanied by her daughter.

## 2021-12-06 NOTE — Assessment & Plan Note (Signed)
Baseline creatinine of about 1.5-1.6. Creatinine of 2.29 no admission. Likely secondary to decreased oral intake and diarrhea with suspected Crohn flare. No hydronephrosis on CT imaging. Initially improved with IV fluids with slight bump today likely related to emesis over the weekend -Continue IV fluids while off of TPN -D5 1/2 NS @ 100 mL/hr

## 2021-12-07 ENCOUNTER — Telehealth: Payer: Self-pay

## 2021-12-07 LAB — CULTURE, BLOOD (ROUTINE X 2)
Special Requests: ADEQUATE
Special Requests: ADEQUATE

## 2021-12-07 NOTE — Telephone Encounter (Addendum)
Transition Care Management Unsuccessful Follow-up Telephone Call  Date of discharge and from where:TCM [place] 12-06-21 Dx: Sepsis due to candida    Attempts:  1st Attempt  Reason for unsuccessful TCM follow-up call:  Left voice message  Transition Care Management Unsuccessful Follow-up Telephone Call  Date of discharge and from where:  [place] 12-06-21 Dx: Sepsis due to candida  Attempts:  2nd Attempt  Reason for unsuccessful TCM follow-up call:  Left voice message  Transition Care Management Unsuccessful Follow-up Telephone Call  Date of discharge and from where:  [employer] [facility] 12-06-21 Dx: Sepsis due to candida  Attempts:  3rd Attempt  Reason for unsuccessful TCM follow-up call:  Left voice message- ok to schedule TCM appt if pt calls back

## 2021-12-07 NOTE — Telephone Encounter (Signed)
Called and spoke to Caitlin Peters. Verified end date of fluconazole of 1/18. She wanted to makes sure that the labs went to the TPN provider afterwards and I verified that was true. No other issues or questions.

## 2021-12-07 NOTE — Telephone Encounter (Incomplete Revision)
Transition Care Management Unsuccessful Follow-up Telephone Call  Date of discharge and from where:TCM [name] 12-06-21 Dx: Sepsis due to candida    Attempts:  1st Attempt  Reason for unsuccessful TCM follow-up call:  Left voice message  Transition Care Management Unsuccessful Follow-up Telephone Call  Date of discharge and from where:  Caitlin Peters 12-06-21 Dx: Sepsis due to candida  Attempts:  2nd Attempt  Reason for unsuccessful TCM follow-up call:  Left voice message

## 2021-12-07 NOTE — Telephone Encounter (Signed)
Tanya with [facility] called needing to clarify OPAT order. Routing to pharmacy team.   Beryle Flock, RN

## 2021-12-12 NOTE — Discharge Summary (Signed)
Physician Discharge Summary   Patient: Caitlin Peters MRN: [id number] DOB: 09/17/1961  Admit date:     11/27/2021  Discharge date: 12/06/2021  Discharge Physician: Berle Mull   PCP: [name], [name], MD   Recommendations at discharge:   Follow-up with ID and PCP as recommended.  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 Patient presented secondary to abdominal pain, vomiting and fever with concern for Crohn flare but found to have associated Candidemia. Eraxis started with transition to Fluconazole IV. TPN held and PICC line removed on 1/4. Colonoscopy on 1/4 negative for evidence of Crohn flare. Repeat blood cultures no growth to date.  PICC reinsertion 1/9.  * Sepsis due to Candida [name])- (present on admission) Recurrent issue. In setting of tunneled IV for chronic TPN. Initial blood cultures significant for Candida Lusitaniae. Patient empirically started on Eraxis. Infectious disease on board and patient transitioned to fluconazole 400 mg x 1 dose tapering to fluconazole 200 mg daily. Transthoracic Echocardiogram (1/4)  obtained and negative for evidence of valvular vegetations. Repeat blood culture (1/4) with no growth to date. -Infectious disease recommendations (1/5): Continue fluconazole with plan for 2 weeks of treatment (1/4 - 1/18), outpatient ophthalmology followup, await susceptibilities, outpatient ID follow-up on 1/31 at 11:30 AM.  PICC (peripherally inserted central catheter) in place Limited IV access. Initially with right femoral vein PICC line which was removed on 1/4 by IR secondary to candidemia. Planned for PICC reinsertion 1/9 but rescheduled to 1/10.  Sepsis ([place])- (present on admission) Present on admission. Secondary to Candidemia.  Pancreatic insufficiency- (present on admission) -Continue Creon  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[name])- (present on admission) Colonoscopy not consistent with Crohn flare. GI consulted. GI pathogen panel and C. Difficile ordered but no sample to date. Previously on budesonide but discontinued in 2022.  Nausea & vomiting- (present on admission) Occurring 1/6. Unsure of etiology. Associated abdominal pain but this is also a chronic issue. Patient vomited food from the whole day. Abdominal x-ray with possible ileus. Now appears resolved.  Abdominal pain- (present on admission) Secondary to colitis. Negative C. Difficile/GI pathogen panel testing. Colonoscopy performed on 1/4 without evidence of active Crohn. Abdominal pain is improved and patient currently has her chronic pain.  Short gut syndrome- (present on admission) -Continue teduglutide -TPN held secondary to line holiday; restarting TPN after PICC placement  Crohn's disease ([name])- (present on admission) Hx crohn's with prior resection. No active flare.  Chronic kidney disease, stage 3b ([name]) Baseline creatinine 1.5-1.6  Acute renal failure superimposed on stage 3b chronic kidney disease ([name])- (present on admission) Baseline creatinine of about 1.5-1.6. Creatinine of 2.29  no admission. Likely secondary to  decreased oral intake and diarrhea with suspected Crohn flare. No hydronephrosis on CT imaging. Initially improved with IV fluids with slight bump today likely related to emesis over the weekend  Hypertension- (present on admission) -Continue Amlodipine, Coreg and hydralazine  Seizures ([name]) -Continue Keppra  Anemia- (present on admission) Chronic issue. Hemoglobin drifted down. No overt bleeding noted. Previous iron panel in 6/22 was normal, although ferritin was on the low end of normal. Associated normocytosis. Patient possibly with hemoconcentration on admission. Hemoglobin stable.  Vitamin B12 deficiency- (present on admission) -Continue B12 supplementation  Chronic pain syndrome- (present on admission) -Continue home meds (methadone, dilaudid PO) with prn IV breakthrough  Depression- (present on admission) -Continue Wellbutrin and Cymbalta  LFTs abnormal- (present on admission) Chronic. 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  Bacteremia associated with intravascular line ([name])-resolved as of 11/29/2021, (present on admission) Patient treated on previous admission and completed antibiotic course on 11/27/21. At that time, blood culture positive for stenotrophomonas maltophilia and pseudomonas putida. Repeat blood culture this admission shows treatment of bacteremia.  Body mass index is 19.05 kg/m.   Malnutrition Type: Nutrition Problem: Moderate Malnutrition Etiology: chronic illness (Crohn's with SBS on chronic home TPN) Nutrition Interventions: Interventions: Boost Breeze, MVI, TPN  Consultants: Gastroenterology, ID Procedures performed:  IR guided right femoral tunneled central venous catheter Disposition: Home health Diet recommendation:   DISCHARGE MEDICATION: Allergies as of 12/06/2021       Reactions   Meperidine Hives   Other  reaction(s): GI Upset Due to [place]   Hyoscyamine Hives, Swelling   Legs swelling Disorientation   Cefepime Other (See Comments)   Neurotoxicity occurring in setting of AKI. Ceftriaxone tolerated during same admit   Gabapentin Other (See Comments)   unknown   Lyrica [pregabalin] Other (See Comments)   Has chronic dehydration and made it worse   Topamax [topiramate] Other (See Comments)   Has chronic dehydration and made it worse   Zosyn [piperacillin Sod-tazobactam So] Other (See Comments)   Patient reports it makes her vomit, her neck stiff, and her "heart feel funny" Affected kidneys   Fentanyl Rash   Pt is allergic to fentanyl patch related to the glue (gives her a rash) Pt states she is NOT allergic to fentanyl IV medicine   Morphine And Related Rash        Medication List     STOP taking these medications    ciprofloxacin 0.2 mg/ml + heparin 5000 units/ml Soln   ciprofloxacin 400 MG/200ML Soln Commonly known as: CIPRO   levofloxacin 500 MG/100ML Soln Commonly known as: LEVAQUIN       TAKE these medications    acetaminophen 650 MG CR tablet Commonly known as: TYLENOL Take 1,300 mg by mouth every 6 (six) hours.   amLODipine 10 MG tablet Commonly known as: NORVASC Take 1 tablet by mouth once daily   buPROPion ER 100 MG 12 hr tablet Commonly known as: WELLBUTRIN SR Take 2 tablets by mouth once daily   Calcium 500 MG tablet Take 500 mg by mouth daily. Notes to patient: Resume home regimen   carvedilol 12.5 MG tablet Commonly known as: COREG Take 1 tablet (12.5 mg total) by mouth 2 (two) times daily with a meal.   cholecalciferol 25 MCG (1000 UNIT) tablet Commonly known as: VITAMIN D3 Take 1,000 Units by mouth daily.   cycloSPORINE 0.05 % ophthalmic emulsion  Commonly known as: RESTASIS Place 1 drop into both eyes 2 (two) times daily.   Dexilant 60 MG capsule Generic drug: dexlansoprazole Take 1 capsule by mouth once daily Notes to patient:  Resume home regimen   diphenoxylate-atropine 2.5-0.025 MG tablet Commonly known as: LOMOTIL Take 1 tablet by mouth 2 (two) times daily as needed for diarrhea or loose stools. Notes to patient: Resume home regimen   DULoxetine 60 MG capsule Commonly known as: CYMBALTA Take 2 capsules (120 mg total) by mouth daily.   estradiol 2 MG tablet Commonly known as: ESTRACE Take 1 tablet by mouth once daily   famotidine 20 MG tablet Commonly known as: PEPCID Take 20 mg by mouth daily as needed for heartburn or indigestion. Notes to patient: Resume home regimen   fluconazole  IVPB Commonly known as: DIFLUCAN Inject 200 mg into the vein daily for 9 days. Indication:  Candida lusitaniae fungemia First Dose: Yes Last Day of Therapy:  12/13/21 Labs - Once weekly:  CBC/D and BMP, Labs - Every other week:  ESR and CRP Method of administration: Premix bag / Control-A-Flo (CAF) Method of administration may be changed at the discretion of the patient and/or caregiver's ability to self-administer the medication ordered.   Gattex 5 MG Kit Generic drug: Teduglutide (rDNA) Inject 1.5 mg into the skin daily.   hydrALAZINE 100 MG tablet Commonly known as: APRESOLINE Take 1 tablet (100 mg total) by mouth 3 (three) times daily.   HYDROmorphone 4 MG tablet Commonly known as: DILAUDID Take 4 mg by mouth 4 (four) times daily.   levETIRAcetam 500 MG tablet Commonly known as: KEPPRA Take 1 tablet (500 mg total) by mouth 2 (two) times daily.   lipase/protease/amylase 36000 UNITS Cpep capsule Commonly known as: Creon Take 1 capsule (36,000 Units total) by mouth See admin instructions. 36000 units with meals What changed:  when to take this additional instructions   loperamide 2 MG capsule Commonly known as: IMODIUM Take 4 mg by mouth 2 (two) times daily as needed for diarrhea or loose stools. Notes to patient: Resume home regimen   methadone 5 MG tablet Commonly known as: DOLOPHINE Take 1  tablet (5 mg total) by mouth 5 (five) times daily.   methocarbamol 500 MG tablet Commonly known as: Robaxin Take 1 tablet (500 mg total) by mouth in the morning and at bedtime.   MULTIVITAMIN ADULT PO Take 1 tablet by mouth daily.   Narcan 4 MG/0.1ML Liqd nasal spray kit Generic drug: naloxone Place 1 spray into the nose once as needed (overdose). Notes to patient: Resume home regimen   ondansetron 4 MG tablet Commonly known as: ZOFRAN Take 4 mg by mouth every 8 (eight) hours as needed for nausea or vomiting. Notes to patient: Last dose given 01/11 12:17pm   polyvinyl alcohol 1.4 % ophthalmic solution Commonly known as: LIQUIFILM TEARS Place 1 drop into both eyes daily as needed for dry eyes. Notes to patient: Resume home regimen   PRESCRIPTION MEDICATION Inject 1 each into the vein daily. Home TPN . [facility] [place] . 1 bag for 12 hours. [phone] Notes to patient: Resume home regimen   promethazine 25 MG tablet Commonly known as: PHENERGAN TAKE 1 TABLET BY MOUTH EVERY 6 HOURS AS NEEDED FOR NAUSEA FOR VOMITING What changed: See the new instructions.   sodium chloride 0.9 % infusion Inject 1,000 mLs into the vein daily. For chronic dehydration   sucralfate 1 GM/10ML suspension Commonly known as: Carafate Take  10 mLs (1 g total) by mouth 2 (two) times daily.   TRALEMENT IV Inject 1 mL into the vein See admin instructions. Used in TPN bag 4 times weekly   vitamin B-12 1000 MCG tablet Commonly known as: CYANOCOBALAMIN Take 1,000 mcg by mouth daily.               Discharge Care Instructions  (From admission, onward)           Start     Ordered   12/06/21 0000  Change dressing on IV access line weekly and PRN  (Home infusion instructions - [facility] )        12/06/21 1217            Follow-up Information     Prudencio Pair T, MD Follow up on 12/26/2021.   Specialty: Infectious Diseases Why: appointment at  11:30am Contact information: [address] [place] [address] [phone]         [name], [name], MD. Schedule an appointment as soon as possible for a visit in 1 week(s).   Specialty: Internal Medicine Contact information: [place] [address] [phone]                 Discharge Exam: [place] Weights   11/27/21 1225 11/29/21 1234 12/04/21 1541  Weight: 61.2 kg 62 kg 56.8 kg   Vitals:   12/06/21 1008 12/06/21 1355 12/06/21 1558 12/06/21 1839  BP: (!) 144/64 (!) 139/58 122/65 (!) 128/56  Pulse: 75 68 68 65  Resp: 17 18    Temp: 98.8 F (37.1 C) 98.7 F (37.1 C)    TempSrc: Oral Oral    SpO2: 100% 99%    Weight:      Height:        General: Appear in mild distress, no Rash; Oral Mucosa Clear, moist. no Abnormal Neck Mass Or lumps, Conjunctiva normal  Cardiovascular: S1 and S2 Present, no Murmur, Respiratory: good respiratory effort, Bilateral Air entry present and CTA, no Crackles, no wheezes Abdomen: Bowel Sound present, Soft and no tenderness Extremities: no Pedal edema Neurology: alert and oriented to time, place, and person affect appropriate. no new focal deficit Gait not checked due to patient safety concerns   Condition at discharge: good  The results of significant diagnostics from this hospitalization (including imaging, microbiology, ancillary and laboratory) are listed below for reference.   Imaging Studies: CT ABDOMEN PELVIS WO CONTRAST  Result Date: 11/27/2021 CLINICAL DATA:  Generalized abdominal pain, history of Crohn disease, recent liver biopsy EXAM: CT ABDOMEN AND PELVIS WITHOUT CONTRAST TECHNIQUE: Multidetector CT imaging of the abdomen and pelvis was performed following the standard protocol without IV contrast. Unenhanced CT was performed per clinician order. Lack of IV contrast limits sensitivity and specificity, especially for evaluation of abdominal/pelvic solid viscera. COMPARISON:   11/11/2021, 11/11/2020 FINDINGS: Lower chest: No acute pleural or parenchymal lung disease. Hepatobiliary: Biliary dilation and pneumobilia are again identified, with high attenuation material within the intrahepatic and extrahepatic bile ducts likely refluxed oral contrast suggesting prior sphincterotomy or incompetent sphincter. Otherwise unremarkable unenhanced appearance of the liver. Gallbladder is absent. Pancreas: Unremarkable unenhanced appearance. Spleen: Unremarkable unenhanced appearance. Adrenals/Urinary Tract: No urinary tract calculi or obstructive uropathy. The adrenals are difficult to appreciate on this unenhanced exam. Bladder is unremarkable. Stomach/Bowel: Evaluation is limited without IV contrast. Oral contrast has traversed into the colon by the time of imaging. Multiple enterotomy staple lines are again seen within  the mid abdomen. There is wall thickening of the distal colon, extending from the splenic flexure through the rectosigmoid junction. Scattered colonic gas fluid levels are noted. Vascular/Lymphatic: Right femoral PICC again identified, tip at the level of the hepatic veins. No other significant vascular finding on this limited unenhanced exam. No discrete adenopathy. Reproductive: Status post hysterectomy. No adnexal masses. Other: No free fluid or free intraperitoneal gas. No abdominal wall hernia. Musculoskeletal: No acute or destructive bony lesions. Reconstructed images demonstrate no additional findings. IMPRESSION: 1. Distal colonic wall thickening consistent with inflammatory or infectious colitis. 2. No evidence of bowel obstruction or ileus. Scattered distal colonic gas fluid levels may reflect diarrhea or recent laxative/enema use. 3. Stable biliary duct dilation, with increased pneumobilia and reflux of oral contrast into the biliary tree. This likely reflects previous sphincterotomy. 4. Right femoral PICC, tip at level of the hepatic veins. Electronically Signed   By:  Randa Ngo M.D.   On: 11/27/2021 15:58   US BIOPSY (LIVER)  Result Date: 11/14/2021 INDICATION: Elevated LFTs of uncertain etiology. Please perform ultrasound-guided liver biopsy for tissue diagnostic purposes. EXAM: ULTRASOUND GUIDED LIVER BIOPSY COMPARISON:  Right upper quadrant abdominal ultrasound-11/11/2021; CT abdomen and pelvis-11/11/2021 MEDICATIONS: None ANESTHESIA/SEDATION: Moderate (conscious) sedation was employed during this procedure as administered by the Interventional Radiology RN. A total of Versed 2 mg and Fentanyl 100 mcg was administered intravenously. Moderate Sedation Time: 12 minutes. [name] level of consciousness and vital signs were monitored continuously by radiology nursing throughout the procedure under my direct supervision. COMPLICATIONS: None immediate. PROCEDURE: Informed written consent was obtained from the patient after a discussion of the risks, benefits and alternatives to treatment. The patient understands and consents the procedure. A timeout was performed prior to the initiation of the procedure. Ultrasound scanning was performed of the right upper abdominal quadrant and the procedure was planned. The right upper abdomen was prepped and draped in the usual sterile fashion. The overlying soft tissues were anesthetized with 1% lidocaine with epinephrine. A 17 gauge, 6.8 cm co-axial needle was advanced into a peripheral aspect of the right lobe of the liver and 3 core biopsies were obtained with an 18 gauge core device under direct ultrasound guidance. The co-axial needle track was embolized with the administration of a Gel-Foam slurry. Superficial hemostasis was obtained with manual compression. Post procedural scanning was negative for definitive area of hemorrhage. A dressing was placed. The patient tolerated the procedure well without immediate post procedural complication. IMPRESSION: Technically successful ultrasound guided liver biopsy. Electronically  Signed   By: Sandi Mariscal M.D.   On: 11/14/2021 13:10   IR Removal Tun Cv Cath W/O FL  Result Date: 11/30/2021 CLINICAL DATA:  Long-term indwelling right femoral venous catheter for TPN. Multiple upper extremity central venous obstructions. Fungemia. Catheter removal requested. EXAM: EXAM TUNNELED CENTRAL VENOUS CATHETER REMOVAL TECHNIQUE: The procedure, risks (including but not limited to bleeding, infection, organ damage ), benefits, and alternatives were explained to the patient. Questions regarding the procedure were encouraged and answered. The patient understands and consents to the procedure. The dressing was removed. The 3 retention sutures were cut and removed in their entirety. The right femoral tunneled venous catheter was removed intact. Hemostasis was achieved. Site covered with sterile dressing. The patient tolerated the procedure well. COMPLICATIONS: COMPLICATIONS None immediate IMPRESSION: 1. Technically successful tunneled right femoral venous catheter removal. Electronically Signed   By: Lucrezia Europe M.D.   On: 11/30/2021 11:03   IR US Guide Vasc Access Left  Result Date: 12/05/2021 INDICATION: 61 year old with history of Crohn's disease and short gut syndrome. Patient had a right femoral tunneled central venous catheter recently removed due to fungemia. Patient has had a catheter holiday and needs a new tunneled central venous catheter. EXAM: FLUOROSCOPIC AND ULTRASOUND GUIDED PLACEMENT OF A TUNNELED CENTRAL VENOUS CATHETER Physician: Stephan Minister. Henn, MD FLUOROSCOPY TIME:  1 minute, 6 seconds, 3 mGy MEDICATIONS: Moderate sedation ANESTHESIA/SEDATION: Moderate (conscious) sedation was employed during this procedure. A total of Versed 2.37m and fentanyl 100 mcg was administered intravenously at the order of the provider performing the procedure. Total intra-service moderate sedation time: 40 minutes. Patient's level of consciousness and vital signs were monitored continuously by radiology nurse  throughout the procedure under the supervision of the provider performing the procedure. PROCEDURE: The procedure was explained to the patient. The risks and benefits of the procedure were discussed and the patient's questions were addressed. Informed consent was obtained from the patient. The patient was placed supine on the interventional table. Ultrasound confirmed a patent left common femoralvein. Ultrasound images were obtained for documentation. The left groin was prepped and draped in a sterile fashion. The left groin was anesthetized with 1% lidocaine. Maximal barrier sterile technique was utilized including caps, mask, sterile gowns, sterile gloves, sterile drape, hand hygiene and skin antiseptic. A small incision was made with #11 blade scalpel. A 21 gauge needle directed into the left common femoral vein with ultrasound guidance. A micropuncture dilator set was placed. A dual lumen Powerline catheter was selected. The skin in the anterior upper thigh was anesthetized and a small incision was made with an #11 blade scalpel. A subcutaneous tunnel was formed to the vein dermatotomy site. The catheter was brought through the tunnel. The vein dermatotomy site was dilated to accommodate a peel-away sheath. The catheter was placed through the peel-away sheath and directed into the central venous structures. The tip of the catheter was placed at the inferior cavoatrial junction with fluoroscopy. Fluoroscopic images were obtained for documentation. Both lumens were found to aspirate and flush well. Both lumens were flushed with saline. The vein dermatotomy site was closed using a single layer of absorbable suture and Dermabond. The catheter was secured to the skin using Prolene suture. FINDINGS: Catheter tip at the inferior cavoatrial junction. COMPLICATIONS: None IMPRESSION: Successful placement of a left femoral tunneled central venous catheter using ultrasound and fluoroscopic guidance. Electronically Signed    By: AMarkus DaftM.D.   On: 12/05/2021 12:15   DG Chest Port 1 View  Result Date: 11/27/2021 CLINICAL DATA:  Sixty fever EXAM: PORTABLE CHEST 1 VIEW COMPARISON:  October 07, 2021 FINDINGS: The heart size and mediastinal contours are stable. Both lungs are clear. The visualized skeletal structures are unremarkable. IMPRESSION: No active disease. Electronically Signed   By: WAbelardo DieselM.D.   On: 11/27/2021 14:14   DG Abd Portable 1V  Result Date: 12/02/2021 CLINICAL DATA:  Abdominal pain. Colonoscopy 3 days ago. History of Crohn's disease. EXAM: PORTABLE ABDOMEN - 1 VIEW COMPARISON:  07/08/2021 and older exams.  CT, 11/27/2021. FINDINGS: Mildly dilated small bowel in the central abdomen. Air is seen within normal caliber colon. Bowel anastomosis staples noted in the upper and left mid abdomen, stable. IMPRESSION: 1. Mildly dilated small bowel, nonspecific. This may reflect a mild adynamic ileus. Low-grade partial small bowel obstruction not excluded but felt less likely. Electronically Signed   By: DLajean ManesM.D.   On: 12/02/2021 14:53   ECHOCARDIOGRAM COMPLETE  Result Date:  11/29/2021    ECHOCARDIOGRAM REPORT   Patient Name:   Caitlin Peters Date of Exam: 11/29/2021 Medical Rec #:  [id number]        Height:       68.0 in Accession #:    [id number]       Weight:       135.0 lb Date of Birth:  [birth year]        BSA:          1.729 m Patient Age:    61 years         BP:           172/69 mmHg Patient Gender: F                HR:           65 bpm. Exam Location:  Inpatient Procedure: 2D Echo, 3D Echo, Cardiac Doppler, Color Doppler and Strain Analysis Indications:    Bacteremia  History:        Patient has prior history of Echocardiogram examinations, most                 recent 07/12/2021. Signs/Symptoms:Fever; Risk                 Factors:Hypertension.  Sonographer:    Roseanna Rainbow RDCS Referring Phys: [id number] [facility] T VU  Sonographer Comments: Global longitudinal strain was attempted. IMPRESSIONS  1. Left  ventricular ejection fraction, by estimation, is 65 to 70%. Left ventricular ejection fraction by 3D volume is 67 %. The left ventricle has normal function. The left ventricle has no regional wall motion abnormalities. There is moderate left ventricular hypertrophy. Left ventricular diastolic parameters are consistent with Grade I diastolic dysfunction (impaired relaxation). The average left ventricular global longitudinal strain is -24.9 %. The global longitudinal strain is normal.  2. Right ventricular systolic function is normal. The right ventricular size is normal. There is normal pulmonary artery systolic pressure.  3. Left atrial size was severely dilated.  4. The mitral valve is grossly normal. Mild mitral valve regurgitation.  5. The aortic valve is tricuspid. There is mild calcification of the aortic valve. There is mild thickening of the aortic valve. Aortic valve regurgitation is not visualized. Aortic valve sclerosis is present, with no evidence of aortic valve stenosis.  6. The inferior vena cava is dilated in size with >50% respiratory variability, suggesting right atrial pressure of 8 mmHg. Comparison(s): Compared to prior TTE in 06/2021, there is no significant change. Conclusion(s)/Recommendation(s): No evidence of valvular vegetations on this transthoracic echocardiogram. Consider a transesophageal echocardiogram to exclude infective endocarditis if clinically indicated. FINDINGS  Left Ventricle: Left ventricular ejection fraction, by estimation, is 65 to 70%. Left ventricular ejection fraction by 3D volume is 67 %. The left ventricle has normal function. The left ventricle has no regional wall motion abnormalities. The average left ventricular global longitudinal strain is -24.9 %. The global longitudinal strain is normal. The left ventricular internal cavity size was normal in size. There is moderate left ventricular hypertrophy. Left ventricular diastolic parameters are consistent with Grade I  diastolic dysfunction (impaired relaxation). Right Ventricle: The right ventricular size is normal. No increase in right ventricular wall thickness. Right ventricular systolic function is normal. There is normal pulmonary artery systolic pressure. The tricuspid regurgitant velocity is 2.45 m/s, and  with an assumed right atrial pressure of 3 mmHg, the estimated right ventricular systolic pressure is 54.6 mmHg. Left Atrium: Left atrial size was severely dilated. Right  Atrium: Right atrial size was normal in size. Pericardium: There is no evidence of pericardial effusion. Mitral Valve: The mitral valve is grossly normal. There is mild thickening of the mitral valve leaflet(s). Mild mitral annular calcification. Mild mitral valve regurgitation. Tricuspid Valve: The tricuspid valve is normal in structure. Tricuspid valve regurgitation is mild. Aortic Valve: The aortic valve is tricuspid. There is mild calcification of the aortic valve. There is mild thickening of the aortic valve. Aortic valve regurgitation is not visualized. Aortic valve sclerosis is present, with no evidence of aortic valve stenosis. Aortic valve mean gradient measures 8.0 mmHg. Aortic valve peak gradient measures 13.0 mmHg. Aortic valve area, by VTI measures 3.55 cm. Pulmonic Valve: The pulmonic valve was normal in structure. Pulmonic valve regurgitation is trivial. Aorta: The aortic root and ascending aorta are structurally normal, with no evidence of dilitation. Venous: The inferior vena cava is dilated in size with greater than 50% respiratory variability, suggesting right atrial pressure of 8 mmHg. IAS/Shunts: The atrial septum is grossly normal.  LEFT VENTRICLE PLAX 2D LVIDd:         4.15 cm         Diastology LVIDs:         2.00 cm         LV e' medial:    5.43 cm/s LV PW:         1.05 cm         LV E/e' medial:  16.8 LV IVS:        1.10 cm         LV e' lateral:   8.92 cm/s LVOT diam:     2.30 cm         LV E/e' lateral: 10.3 LV SV:          153 LV SV Index:   88              2D LVOT Area:     4.15 cm        Longitudinal                                Strain                                2D Strain GLS  -24.9 % LV Volumes (MOD)               Avg: LV vol d, MOD    110.5 ml A2C:                           3D Volume EF LV vol d, MOD    81.7 ml       LV 3D EF:    Left A4C:                                        ventricul LV vol s, MOD    28.5 ml                    ar A2C:  ejection LV vol s, MOD    20.4 ml                    fraction A4C:                                        by 3D LV SV MOD A2C:   82.0 ml                    volume is LV SV MOD A4C:   81.7 ml                    67 %. LV SV MOD BP:    70.8 ml                                 3D Volume EF:                                3D EF:        67 %                                LV EDV:       173 ml                                LV ESV:       57 ml                                LV SV:        116 ml RIGHT VENTRICLE             IVC RV S prime:     10.50 cm/s  IVC diam: 2.30 cm TAPSE (M-mode): 2.0 cm LEFT ATRIUM             Index        RIGHT ATRIUM           Index LA diam:        4.40 cm 2.54 cm/m   RA Area:     13.90 cm LA Vol (A2C):   95.8 ml 55.40 ml/m  RA Volume:   33.30 ml  19.26 ml/m LA Vol (A4C):   78.3 ml 45.28 ml/m LA Biplane Vol: 91.4 ml 52.85 ml/m  AORTIC VALVE AV Area (Vmax):    3.62 cm AV Area (Vmean):   3.41 cm AV Area (VTI):     3.55 cm AV Vmax:           180.00 cm/s AV Vmean:          134.000 cm/s AV VTI:            0.431 m AV Peak Grad:      13.0 mmHg AV Mean Grad:      8.0 mmHg LVOT Vmax:         157.00 cm/s LVOT Vmean:        110.000 cm/s LVOT VTI:          0.368 m LVOT/AV VTI ratio: 0.85  AORTA Ao Root diam:  3.00 cm Ao Asc diam:  2.70 cm MITRAL VALVE                TRICUSPID VALVE MV Area (PHT): 2.93 cm     TR Peak grad:   24.0 mmHg MV Decel Time: 259 msec     TR Vmax:        245.00 cm/s MV E velocity: 91.45 cm/s MV A velocity: 106.00  cm/s  SHUNTS MV E/A ratio:  0.86         Systemic VTI:  0.37 m                             Systemic Diam: 2.30 cm Gwyndolyn Kaufman MD Electronically signed by Gwyndolyn Kaufman MD Signature Date/Time: 11/29/2021/11:59:23 AM    Final    IR TUNNELED CENTRAL VENOUS CATHETER PLACEMENT  Result Date: 12/05/2021 INDICATION: 61 year old with history of Crohn's disease and short gut syndrome. Patient had a right femoral tunneled central venous catheter recently removed due to fungemia. Patient has had a catheter holiday and needs a new tunneled central venous catheter. EXAM: FLUOROSCOPIC AND ULTRASOUND GUIDED PLACEMENT OF A TUNNELED CENTRAL VENOUS CATHETER Physician: Stephan Minister. Henn, MD FLUOROSCOPY TIME:  1 minute, 6 seconds, 3 mGy MEDICATIONS: Moderate sedation ANESTHESIA/SEDATION: Moderate (conscious) sedation was employed during this procedure. A total of Versed 2.53m and fentanyl 100 mcg was administered intravenously at the order of the provider performing the procedure. Total intra-service moderate sedation time: 40 minutes. Patient's level of consciousness and vital signs were monitored continuously by radiology nurse throughout the procedure under the supervision of the provider performing the procedure. PROCEDURE: The procedure was explained to the patient. The risks and benefits of the procedure were discussed and the patient's questions were addressed. Informed consent was obtained from the patient. The patient was placed supine on the interventional table. Ultrasound confirmed a patent left common femoralvein. Ultrasound images were obtained for documentation. The left groin was prepped and draped in a sterile fashion. The left groin was anesthetized with 1% lidocaine. Maximal barrier sterile technique was utilized including caps, mask, sterile gowns, sterile gloves, sterile drape, hand hygiene and skin antiseptic. A small incision was made with #11 blade scalpel. A 21 gauge needle directed into the left common  femoral vein with ultrasound guidance. A micropuncture dilator set was placed. A dual lumen Powerline catheter was selected. The skin in the anterior upper thigh was anesthetized and a small incision was made with an #11 blade scalpel. A subcutaneous tunnel was formed to the vein dermatotomy site. The catheter was brought through the tunnel. The vein dermatotomy site was dilated to accommodate a peel-away sheath. The catheter was placed through the peel-away sheath and directed into the central venous structures. The tip of the catheter was placed at the inferior cavoatrial junction with fluoroscopy. Fluoroscopic images were obtained for documentation. Both lumens were found to aspirate and flush well. Both lumens were flushed with saline. The vein dermatotomy site was closed using a single layer of absorbable suture and Dermabond. The catheter was secured to the skin using Prolene suture. FINDINGS: Catheter tip at the inferior cavoatrial junction. COMPLICATIONS: None IMPRESSION: Successful placement of a left femoral tunneled central venous catheter using ultrasound and fluoroscopic guidance. Electronically Signed   By: AMarkus DaftM.D.   On: 12/05/2021 12:15    Microbiology: Results for orders placed or performed during the hospital encounter of 11/27/21  Blood culture (routine x 2)  Status: Abnormal   Collection Time: 11/27/21 12:20 PM   Specimen: BLOOD  Result Value Ref Range Status   Specimen Description   Final    BLOOD RIGHT ANTECUBITAL Performed at [facility] [address], [place], [place] [address]    Special Requests   Final    BOTTLES DRAWN AEROBIC AND ANAEROBIC Blood Culture adequate volume Performed at [facility] [address], [place], [place] [address]    Culture  Setup Time (A)  Final    YEAST AEROBIC BOTTLE ONLY CRITICAL RESULT CALLED TO, READ BACK BY AND VERIFIED WITH: PHARM D M.TUCKER ON [id number] BY E.PARRISH    Culture (A)   Final    CANDIDA LUSITANIAE SEE SEPARATE REPORT Performed at [facility], [place] [address], [place], [place] [address]    Report Status 12/07/2021 FINAL  Final  Blood Culture ID Panel (Reflexed)     Status: None   Collection Time: 11/27/21 12:20 PM  Result Value Ref Range Status   Enterococcus faecalis NOT DETECTED NOT DETECTED Final   Enterococcus Faecium NOT DETECTED NOT DETECTED Final   Listeria monocytogenes NOT DETECTED NOT DETECTED Final   Staphylococcus species NOT DETECTED NOT DETECTED Final   Staphylococcus aureus (BCID) NOT DETECTED NOT DETECTED Final   Staphylococcus epidermidis NOT DETECTED NOT DETECTED Final   Staphylococcus lugdunensis NOT DETECTED NOT DETECTED Final   Streptococcus species NOT DETECTED NOT DETECTED Final   Streptococcus agalactiae NOT DETECTED NOT DETECTED Final   Streptococcus pneumoniae NOT DETECTED NOT DETECTED Final   Streptococcus pyogenes NOT DETECTED NOT DETECTED Final   A.calcoaceticus-baumannii NOT DETECTED NOT DETECTED Final   Bacteroides fragilis NOT DETECTED NOT DETECTED Final   Enterobacterales NOT DETECTED NOT DETECTED Final   Enterobacter cloacae complex NOT DETECTED NOT DETECTED Final   Escherichia coli NOT DETECTED NOT DETECTED Final   Klebsiella aerogenes NOT DETECTED NOT DETECTED Final   Klebsiella oxytoca NOT DETECTED NOT DETECTED Final   Klebsiella pneumoniae NOT DETECTED NOT DETECTED Final   Proteus species NOT DETECTED NOT DETECTED Final   Salmonella species NOT DETECTED NOT DETECTED Final   Serratia marcescens NOT DETECTED NOT DETECTED Final   Haemophilus influenzae NOT DETECTED NOT DETECTED Final   Neisseria meningitidis NOT DETECTED NOT DETECTED Final   Pseudomonas aeruginosa NOT DETECTED NOT DETECTED Final   Stenotrophomonas maltophilia NOT DETECTED NOT DETECTED Final   Candida albicans NOT DETECTED NOT DETECTED Final   Candida auris NOT DETECTED NOT DETECTED Final   Candida glabrata NOT DETECTED NOT DETECTED Final    Candida krusei NOT DETECTED NOT DETECTED Final   Candida parapsilosis NOT DETECTED NOT DETECTED Final   Candida tropicalis NOT DETECTED NOT DETECTED Final   Cryptococcus neoformans/gattii NOT DETECTED NOT DETECTED Final    Comment: Performed at [facility], [address] [place], [place] [address]  Blood culture (routine x 2)     Status: Abnormal   Collection Time: 11/27/21  9:26 PM   Specimen: BLOOD  Result Value Ref Range Status   Specimen Description   Final    BLOOD BLOOD LEFT HAND Performed at [facility] [address] [place], [place] [address]    Special Requests   Final    BOTTLES DRAWN AEROBIC ONLY Blood Culture adequate volume Performed at [facility] [address], [place], [place] [address]    Culture  Setup Time   Final    YEAST AEROBIC BOTTLE ONLY CRITICAL VALUE NOTED.  VALUE IS CONSISTENT WITH PREVIOUSLY REPORTED AND CALLED VALUE.    Culture (A)  Final    CANDIDA LUSITANIAE SUSCEPTIBILITIES PERFORMED ON PREVIOUS CULTURE WITHIN THE LAST 5 DAYS. Performed at [facility], [place] [address] [place], [place] [address]    Report Status 12/07/2021 FINAL  Final  Gastrointestinal Panel by PCR , Stool     Status: None   Collection Time: 11/28/21  1:30 AM   Specimen: STOOL  Result Value Ref Range Status   Campylobacter species NOT DETECTED NOT DETECTED Final   Plesimonas shigelloides NOT DETECTED NOT DETECTED Final   Salmonella species NOT DETECTED NOT DETECTED Final   Yersinia enterocolitica NOT DETECTED NOT DETECTED Final   Vibrio species NOT DETECTED NOT DETECTED Final   Vibrio cholerae NOT DETECTED NOT DETECTED Final   Enteroaggregative E coli (EAEC) NOT DETECTED NOT DETECTED Final   Enteropathogenic E coli (EPEC) NOT DETECTED NOT DETECTED Final   Enterotoxigenic E coli (ETEC) NOT DETECTED NOT DETECTED Final   Shiga like toxin producing E coli (STEC) NOT DETECTED NOT DETECTED Final    Shigella/Enteroinvasive E coli (EIEC) NOT DETECTED NOT DETECTED Final   Cryptosporidium NOT DETECTED NOT DETECTED Final   Cyclospora cayetanensis NOT DETECTED NOT DETECTED Final   Entamoeba histolytica NOT DETECTED NOT DETECTED Final   Giardia lamblia NOT DETECTED NOT DETECTED Final   Adenovirus F40/41 NOT DETECTED NOT DETECTED Final   Astrovirus NOT DETECTED NOT DETECTED Final   Norovirus GI/GII NOT DETECTED NOT DETECTED Final   Rotavirus A NOT DETECTED NOT DETECTED Final   Sapovirus (I, II, IV, and V) NOT DETECTED NOT DETECTED Final    Comment: Performed at [facility], [address] [place], [place] [address]  C Difficile Quick Screen w PCR reflex     Status: None   Collection Time: 11/28/21  1:30 AM   Specimen: STOOL  Result Value Ref Range Status   C Diff antigen NEGATIVE NEGATIVE Final   C Diff toxin NEGATIVE NEGATIVE Final   C Diff interpretation No C. difficile detected.  Final    Comment: Performed at [facility], [name] [address] [place], [place] [address]  Culture, blood (routine x 2)     Status: None   Collection Time: 11/29/21  6:15 AM   Specimen: BLOOD RIGHT HAND  Result Value Ref Range Status   Specimen Description   Final    BLOOD RIGHT HAND Performed at [facility] [address], [place], [place] [address]    Special Requests   Final    BOTTLES DRAWN AEROBIC ONLY Blood Culture adequate volume Performed at [facility] [address], [place], [place] [address]    Culture   Final    NO GROWTH 5 DAYS Performed at [facility], [place] [address] [place], [place] [address]    Report Status 12/04/2021 FINAL  Final  Culture, blood (routine x 2)     Status: None   Collection Time: 11/29/21  6:16 AM   Specimen: BLOOD  Result Value Ref Range Status   Specimen Description   Final    BLOOD Performed at [facility] [address], [place], [place] [address]    Special  Requests   Final    BOTTLES DRAWN AEROBIC ONLY Blood Culture results may not be optimal due to an inadequate volume of blood received in culture bottles Performed at [facility] [address], [place], [place] [address]  Culture   Final    NO GROWTH 5 DAYS Performed at [facility], [place] [address], [place], [place] [address]    Report Status 12/04/2021 FINAL  Final    Labs: CBC: No results for input(s): WBC, NEUTROABS, HGB, HCT, MCV, PLT in the last 168 hours. Basic Metabolic Panel: No results for input(s): NA, K, CL, CO2, GLUCOSE, BUN, CREATININE, CALCIUM, MG, PHOS in the last 168 hours. Liver Function Tests: No results for input(s): AST, ALT, ALKPHOS, BILITOT, PROT, ALBUMIN in the last 168 hours. CBG: No results for input(s): GLUCAP in the last 168 hours.  Discharge time spent: greater than 30 minutes.  Signed: Berle Mull, MD [facility] 12/12/2021

## 2021-12-18 ENCOUNTER — Inpatient Hospital Stay: Payer: Medicare Other | Admitting: Internal Medicine

## 2021-12-20 ENCOUNTER — Encounter: Payer: Self-pay | Admitting: Internal Medicine

## 2021-12-20 ENCOUNTER — Ambulatory Visit (INDEPENDENT_AMBULATORY_CARE_PROVIDER_SITE_OTHER): Payer: Medicare Other | Admitting: Internal Medicine

## 2021-12-20 VITALS — BP 130/60 | HR 85 | Resp 20 | Ht 68.0 in | Wt 136.0 lb

## 2021-12-20 DIAGNOSIS — B377 Candidal sepsis: Secondary | ICD-10-CM

## 2021-12-20 DIAGNOSIS — M816 Localized osteoporosis [Lequesne]: Secondary | ICD-10-CM

## 2021-12-20 DIAGNOSIS — Z09 Encounter for follow-up examination after completed treatment for conditions other than malignant neoplasm: Secondary | ICD-10-CM

## 2021-12-20 DIAGNOSIS — K50818 Crohn's disease of both small and large intestine with other complication: Secondary | ICD-10-CM

## 2021-12-20 DIAGNOSIS — T80211D Bloodstream infection due to central venous catheter, subsequent encounter: Secondary | ICD-10-CM

## 2021-12-20 DIAGNOSIS — K912 Postsurgical malabsorption, not elsewhere classified: Secondary | ICD-10-CM

## 2021-12-20 DIAGNOSIS — M25512 Pain in left shoulder: Secondary | ICD-10-CM

## 2021-12-20 DIAGNOSIS — R6520 Severe sepsis without septic shock: Secondary | ICD-10-CM

## 2021-12-20 DIAGNOSIS — K90829 Short bowel syndrome, unspecified: Secondary | ICD-10-CM

## 2021-12-20 MED ORDER — DENOSUMAB 60 MG/ML ~~LOC~~ SOSY
60.0000 mg | PREFILLED_SYRINGE | Freq: Once | SUBCUTANEOUS | Status: AC
  Administered 2021-12-20: 14:00:00 60 mg via SUBCUTANEOUS

## 2021-12-20 MED ORDER — PROMETHAZINE HCL 25 MG PO TABS: 25.0000 mg | ORAL_TABLET | Freq: Four times a day (QID) | ORAL | 0 refills | Status: DC | PRN

## 2021-12-20 MED ORDER — METHOCARBAMOL 500 MG PO TABS: 500.0000 mg | ORAL_TABLET | Freq: Two times a day (BID) | ORAL | 1 refills | Status: AC

## 2021-12-20 NOTE — Progress Notes (Signed)
Established Patient Office Visit     This visit occurred during the SARS-CoV-2 public health emergency.  Safety protocols were in place, including screening questions prior to the visit, additional usage of staff PPE, and extensive cleaning of exam room while observing appropriate contact time as indicated for disinfecting solutions.    CC/Reason for Visit: Hospital follow-up  HPI: Caitlin Peters is a 61 y.o. female who is coming in today for the above mentioned reasons. 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  She was hospitalized from 1/2-1/09/2022.  This time she was found to have Candida sepsis.  She did have PICC line exchange this admission.  She was initially placed on Eraxis and then transitioned over to fluconazole which she completed on 1/18.  She has follow-up with infectious diseases and with cardiology on January 31 and with ophthalmology tomorrow.  She is feeling better than she has in a long time.  She is requesting refills of Robaxin and Phenergan today.  She has her COVID booster scheduled for January 31.  She missed her appointment for Prolia due to her hospitalization.  She would like to get it today if possible.   Past Medical/Surgical History: Past Medical History:  Diagnosis Date   Acute pancreatitis 04/13/2020   Anasarca 10/2019   AVN (avascular necrosis of bone) ([name])    Cataract    Choledocholithiasis (sludge) s/p ERCP 10/2019 10/21/2020   Chronic pain syndrome    CKD (chronic kidney disease), stage III ([name])    Depression    Diverticulosis    GERD (gastroesophageal reflux disease)    HTN (hypertension)    IDA (iron deficiency anemia)    Malnutrition ([place])    Mass in chest    Osteoporosis 12/24/2014   Pancreatitis    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    Multiple SBR for Crohn's  2000-2009; 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; jejunal to transverse colon anastomosis Treated at [place] [facility] lengthening to 165cm Dr Alene Mires, [facility]   Vitamin B12 deficiency     Past Surgical History:  Procedure Laterality Date   ABDOMINAL ADHESION SURGERY  01/22/2018   APPENDECTOMY  1989   BILIARY DILATION  11/26/2019   Procedure: BILIARY DILATION;  Surgeon: Jackquline Denmark, MD;  Location: [facility];  Service: Endoscopy;;   BILIARY DILATION  03/08/2020   Procedure: BILIARY DILATION;  Surgeon: Irving Copas., MD;  Location: [place];  Service: Gastroenterology;;   BIOPSY  03/08/2020   Procedure: BIOPSY;  Surgeon: Irving Copas., MD;  Location: [facility]OS[facility];  Service: Gastroenterology;;   CHEST WALL RESECTION     right thoracotomy,resection of chest mass with anterior rib and reconstruction using prosthetic mesh and video arthroscopy   CHOLECYSTECTOMY  01/22/2018   COLONOSCOPY  2019   COLONOSCOPY WITH PROPOFOL N/A 11/29/2021   Procedure: COLONOSCOPY WITH PROPOFOL;  Surgeon: Yetta Flock, MD;  Location: [facility];  Service: Gastroenterology;  Laterality: N/A;   ENTEROSTOMY CLOSURE  04/1999   ERCP N/A 11/26/2019   Procedure: ENDOSCOPIC RETROGRADE CHOLANGIOPANCREATOGRAPHY (ERCP);  Surgeon: Jackquline Denmark, MD;  Location: [facility];  Service: Endoscopy;  Laterality: N/A;   ERCP N/A 03/08/2020   Procedure: ENDOSCOPIC RETROGRADE CHOLANGIOPANCREATOGRAPHY (ERCP);  Surgeon: Irving Copas., MD;  Location: [place];  Service: Gastroenterology;  Laterality: N/A;   ESOPHAGOGASTRODUODENOSCOPY N/A 03/08/2020   Procedure: ESOPHAGOGASTRODUODENOSCOPY (EGD);  Surgeon: Irving Copas., MD;  Location: [place];  Service: Gastroenterology;  Laterality: N/A;   EUS N/A 03/08/2020   Procedure: UPPER ENDOSCOPIC ULTRASOUND (EUS) LINEAR;  Surgeon: Irving Copas., MD;  Location: [place];  Service: Gastroenterology;   Laterality: N/A;   ILEOCECETOMY  03/1999   ileocolon resection with abdominal stoma   ILEOSTOMY CLOSURE  2001   IR FLUORO GUIDE CV LINE LEFT  01/07/2020   IR FLUORO GUIDE CV LINE LEFT  03/09/2020   IR FLUORO GUIDE CV LINE LEFT  05/09/2020   IR FLUORO GUIDE CV LINE LEFT  07/20/2020   IR FLUORO GUIDE CV LINE RIGHT  08/05/2020   IR FLUORO GUIDE CV LINE RIGHT  04/03/2021   IR PERC TUN PERIT CATH WO PORT S&I /IMAG  12/05/2021   IR PTA VENOUS EXCEPT DIALYSIS CIRCUIT  01/07/2020   IR REMOVAL TUN CV CATH W/O FL  08/05/2020   IR REMOVAL TUN CV CATH W/O FL  07/11/2021   IR REMOVAL TUN CV CATH W/O FL  11/29/2021   IR US GUIDE VASC ACCESS LEFT     x 2 06/17/19 and 09/14/2019   IR US GUIDE VASC ACCESS LEFT  12/05/2021   IR US GUIDE VASC ACCESS RIGHT  08/05/2020   KNEE SURGERY     right knee    LAPAROSCOPIC SMALL BOWEL RESECTION  2009   2000-2009.  SB resections for Crohns Disease - now with Short gut   OMENTECTOMY  01/22/2018   PARTIAL HYSTERECTOMY  1984   with LSO   REMOVAL OF STONES  11/26/2019   Procedure: REMOVAL OF STONES;  Surgeon: Jackquline Denmark, MD;  Location: [facility];  Service: Endoscopy;;   REMOVAL OF STONES  03/08/2020   Procedure: REMOVAL OF STONES;  Surgeon: Irving Copas., MD;  Location: [facility] END[facility]COPY;  Service: Gastroenterology;;   SALPINGOOPHORECTOMY Left 1984   SALPINGOOPHORECTOMY Right 1990   SERIAL TRANSVERSE ENTEROPLASTY (STEP) - SMALL BOWEL LENGTHENING  01/22/2018   Dr Alene Mires, [facility] - SB length from 120 to 165cm    SMALL INTESTINE SURGERY  2002   SMALL INTESTINE SURGERY  2003   SPHINCTEROTOMY  11/26/2019   Procedure: SPHINCTEROTOMY;  Surgeon: Jackquline Denmark, MD;  Location: [facility];  Service: Endoscopy;;   TOTAL ABDOMINAL HYSTERECTOMY  1990   with RSO   UPPER GASTROINTESTINAL ENDOSCOPY      Social History:  reports that she has quit smoking. Her smoking use included cigarettes. She has a 17.50 pack-year smoking history. She has never used smokeless  tobacco. She reports that she does not currently use alcohol. She reports that she does not use drugs.  Allergies: Allergies  Allergen Reactions   Meperidine Hives    Other reaction(s): GI Upset Due to [place]    Hyoscyamine Hives and Swelling    Legs swelling Disorientation   Cefepime Other (See Comments)    Neurotoxicity occurring in setting of AKI. Ceftriaxone tolerated during same admit   Gabapentin Other (See Comments)    unknown   Lyrica [Pregabalin] Other (See Comments)    Has chronic dehydration and made it worse   Topamax [Topiramate] Other (See Comments)    Has chronic dehydration and made it worse   Zosyn [Piperacillin Sod-Tazobactam So] Other (See Comments)    Patient reports it makes her vomit, her neck stiff, and her "heart feel funny" Affected kidneys   Fentanyl Rash    Pt is allergic to fentanyl patch related to the glue (gives her a rash) Pt  states she is NOT allergic to fentanyl IV medicine   Morphine And Related Rash    Family History:  Family History  Problem Relation Age of Onset   Seizures Mother    Glaucoma Mother    CAD Father    Heart disease Father    Hypertension Father    Breast cancer Sister    Multiple sclerosis Sister    Diabetes Sister    Lupus Sister    Colon cancer Other    Crohn's disease Other      Current Outpatient Medications:    acetaminophen (TYLENOL) 650 MG CR tablet, Take 1,300 mg by mouth every 6 (six) hours., Disp: , Rfl:    amLODipine (NORVASC) 10 MG tablet, Take 1 tablet by mouth once daily (Patient taking differently: Take 10 mg by mouth daily.), Disp: 90 tablet, Rfl: 0   buPROPion ER (WELLBUTRIN SR) 100 MG 12 hr tablet, Take 2 tablets by mouth once daily (Patient taking differently: Take 200 mg by mouth daily.), Disp: 60 tablet, Rfl: 2   Calcium 500 MG tablet, Take 500 mg by mouth daily., Disp: , Rfl:    carvedilol (COREG) 12.5 MG tablet, Take 1 tablet (12.5 mg total) by mouth 2 (two) times daily with a meal., Disp:  60 tablet, Rfl: 0   cholecalciferol (VITAMIN D3) 25 MCG (1000 UT) tablet, Take 1,000 Units by mouth daily. , Disp: , Rfl:    cycloSPORINE (RESTASIS) 0.05 % ophthalmic emulsion, Place 1 drop into both eyes 2 (two) times daily., Disp: , Rfl:    DEXILANT 60 MG capsule, Take 1 capsule by mouth once daily, Disp: 90 capsule, Rfl: 0   diphenoxylate-atropine (LOMOTIL) 2.5-0.025 MG tablet, Take 1 tablet by mouth 2 (two) times daily as needed for diarrhea or loose stools., Disp: , Rfl:    DULoxetine (CYMBALTA) 60 MG capsule, Take 2 capsules (120 mg total) by mouth daily., Disp: 180 capsule, Rfl: 1   estradiol (ESTRACE) 2 MG tablet, Take 1 tablet by mouth once daily (Patient taking differently: Take 2 mg by mouth daily.), Disp: 90 tablet, Rfl: 0   famotidine (PEPCID) 20 MG tablet, Take 20 mg by mouth daily as needed for heartburn or indigestion., Disp: , Rfl:    GATTEX 5 MG KIT, Inject 1.5 mg into the skin daily., Disp: , Rfl:    hydrALAZINE (APRESOLINE) 100 MG tablet, Take 1 tablet (100 mg total) by mouth 3 (three) times daily., Disp: 90 tablet, Rfl: 1   HYDROmorphone (DILAUDID) 4 MG tablet, Take 4 mg by mouth 4 (four) times daily., Disp: , Rfl:    levETIRAcetam (KEPPRA) 500 MG tablet, Take 1 tablet (500 mg total) by mouth 2 (two) times daily., Disp: 60 tablet, Rfl: 11   lipase/protease/amylase (CREON) 36000 UNITS CPEP capsule, Take 1 capsule (36,000 Units total) by mouth See admin instructions. 36000 units with meals (Patient taking differently: Take 36,000 Units by mouth 3 (three) times daily with meals.), Disp: , Rfl:    loperamide (IMODIUM) 2 MG capsule, Take 4 mg by mouth 2 (two) times daily as needed for diarrhea or loose stools., Disp: , Rfl:    methadone (DOLOPHINE) 5 MG tablet, Take 1 tablet (5 mg total) by mouth 5 (five) times daily., Disp: 30 tablet, Rfl: 0   methocarbamol (ROBAXIN) 500 MG tablet, Take 1 tablet (500 mg total) by mouth in the morning and at bedtime., Disp: 60 tablet, Rfl: 1    Multiple Vitamins-Minerals (MULTIVITAMIN ADULT PO), Take 1 tablet by mouth daily., Disp: ,  Rfl:    ondansetron (ZOFRAN) 4 MG tablet, Take 4 mg by mouth every 8 (eight) hours as needed for nausea or vomiting., Disp: , Rfl:    polyvinyl alcohol (LIQUIFILM TEARS) 1.4 % ophthalmic solution, Place 1 drop into both eyes daily as needed for dry eyes., Disp: , Rfl:    PRESCRIPTION MEDICATION, Inject 1 each into the vein daily. Home TPN . [facility] in [facility] . 1 bag for 12 hours. [phone], Disp: , Rfl:    promethazine (PHENERGAN) 25 MG tablet, TAKE 1 TABLET BY MOUTH EVERY 6 HOURS AS NEEDED FOR NAUSEA FOR VOMITING (Patient taking differently: Take 25 mg by mouth every 8 (eight) hours as needed for vomiting or nausea.), Disp: 90 tablet, Rfl: 0   sodium chloride 0.9 % infusion, Inject 1,000 mLs into the vein daily. For chronic dehydration, Disp: , Rfl:    sucralfate (CARAFATE) 1 GM/10ML suspension, Take 10 mLs (1 g total) by mouth 2 (two) times daily., Disp: 420 mL, Rfl: 2   Trace Minerals Cu-Mn-Se-Zn (TRALEMENT IV), Inject 1 mL into the vein See admin instructions. Used in TPN bag 4 times weekly, Disp: , Rfl:    vitamin B-12 (CYANOCOBALAMIN) 1000 MCG tablet, Take 1,000 mcg by mouth daily. , Disp: , Rfl:    NARCAN 4 MG/0.1ML LIQD nasal spray kit, Place 1 spray into the nose once as needed (overdose). (Patient not taking: Reported on 12/20/2021), Disp: , Rfl:   Review of Systems:  Constitutional: Denies fever, chills, diaphoresis, appetite change and fatigue.  HEENT: Denies photophobia, eye pain, redness, hearing loss, ear pain, congestion, sore throat, rhinorrhea, sneezing, mouth sores, trouble swallowing, neck pain, neck stiffness and tinnitus.   Respiratory: Denies SOB, DOE, cough, chest tightness,  and wheezing.   Cardiovascular: Denies chest pain, palpitations and leg swelling.  Gastrointestinal: Denies nausea, vomiting, abdominal pain, diarrhea, constipation, blood in stool and  abdominal distention.  Genitourinary: Denies dysuria, urgency, frequency, hematuria, flank pain and difficulty urinating.  Endocrine: Denies: hot or cold intolerance, sweats, changes in hair or nails, polyuria, polydipsia. Musculoskeletal: Denies myalgias, back pain, joint swelling, arthralgias and gait problem.  Skin: Denies pallor, rash and wound.  Neurological: Denies dizziness, seizures, syncope, weakness, light-headedness, numbness and headaches.  Hematological: Denies adenopathy. Easy bruising, personal or family bleeding history  Psychiatric/Behavioral: Denies suicidal ideation, mood changes, confusion, nervousness, sleep disturbance and agitation    Physical Exam: Vitals:   12/20/21 1354  BP: 130/60  Pulse: 85  Resp: 20  SpO2: 99%  Weight: 136 lb (61.7 kg)  Height: _0  (1.727 m)    Body mass index is 20.68 kg/m.   Constitutional: NAD, calm, comfortable Eyes: PERRL, lids and conjunctivae normal, wears corrective lenses ENMT: Mucous membranes are moist.  Respiratory: clear to auscultation bilaterally, no wheezing, no crackles. Normal respiratory effort. No accessory muscle use.  Cardiovascular: Regular rate and rhythm, no murmurs / rubs / gallops. No extremity edema.  Neurologic: Grossly intact and nonfocal Psychiatric: Normal judgment and insight. Alert and oriented x 3. Normal mood.    Impression and Plan:  Hospital discharge follow-up  Localized osteoporosis without current pathological fracture - Plan: denosumab (PROLIA) injection 60 mg  Bloodstream infection associated with central venous catheter, subsequent encounter  Sepsis due to Candida species without acute organ dysfunction ([place])  -She looks and feels better than she has in a long time. -She has completed her course of fluconazole. -She has appointments for hospital follow-ups as recommended by discharging team. -Refills for Phenergan  and Robaxin have been provided. -She will receive her Prolia in  office today. [facility] charts have been reviewed in detail.  Time spent: 32 minutes reviewing chart, interviewing and examining patient and formulating plan of care.     [name], MD [facility]

## 2021-12-25 ENCOUNTER — Other Ambulatory Visit: Payer: Self-pay

## 2021-12-26 ENCOUNTER — Ambulatory Visit (INDEPENDENT_AMBULATORY_CARE_PROVIDER_SITE_OTHER): Payer: Medicare Other | Admitting: Internal Medicine

## 2021-12-26 ENCOUNTER — Ambulatory Visit (INDEPENDENT_AMBULATORY_CARE_PROVIDER_SITE_OTHER): Payer: Medicare Other

## 2021-12-26 ENCOUNTER — Other Ambulatory Visit: Payer: Self-pay

## 2021-12-26 ENCOUNTER — Encounter: Payer: Self-pay | Admitting: Internal Medicine

## 2021-12-26 VITALS — BP 160/76 | HR 75 | Ht 68.0 in | Wt 131.0 lb

## 2021-12-26 VITALS — BP 150/77 | HR 72 | Temp 98.1°F | Wt 131.0 lb

## 2021-12-26 DIAGNOSIS — Z8619 Personal history of other infectious and parasitic diseases: Secondary | ICD-10-CM | POA: Diagnosis present

## 2021-12-26 DIAGNOSIS — Z23 Encounter for immunization: Secondary | ICD-10-CM

## 2021-12-26 DIAGNOSIS — E43 Unspecified severe protein-calorie malnutrition: Secondary | ICD-10-CM

## 2021-12-26 DIAGNOSIS — K8689 Other specified diseases of pancreas: Secondary | ICD-10-CM | POA: Diagnosis not present

## 2021-12-26 DIAGNOSIS — B377 Candidal sepsis: Secondary | ICD-10-CM | POA: Diagnosis not present

## 2021-12-26 DIAGNOSIS — I10 Essential (primary) hypertension: Secondary | ICD-10-CM | POA: Diagnosis not present

## 2021-12-26 MED ORDER — CARVEDILOL 25 MG PO TABS: 25.0000 mg | ORAL_TABLET | Freq: Two times a day (BID) | ORAL | 3 refills | Status: AC

## 2021-12-26 NOTE — Patient Instructions (Signed)
Your infection was well treated.   Will give you the pfizer covid vaccine bivalent booster today  Continue good line care as previous

## 2021-12-26 NOTE — Progress Notes (Signed)
Cardiology Office Note:    Date:  12/26/2021   ID:  Caitlin Peters, DOB 17-Nov-1961, MRN [id number]  PCP:  [name], [name], MD  [facility] Cardiologist:  Rudean Haskell MD [place] Electrophysiologist:  None   Referring MD: [name], [place]*   CC: Follow up palpitations  History of Present Illness:    Caitlin Peters is a 61 y.o. female with a hx of HTN, CKD Stage III,  Aortic Atherosclerosis, short gut syndrome and Crohn's since age 84 with prior CLASBSI who presents for evaluation 11/22/20.  In interim of this visit, patient had negative ZioPatch.   Patient notes that she is just off of antibiotics.  Had another line infection. Was having BP issues on Coreg. For BP No evidence of infective endocarditis. Qtc 430 at least assessment. Crohn's is in remission but her short gut syndrome has been the issues.   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; 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; jejunal to transverse colon anastomosis Treated at [place] SB lengthening to 165cm Dr Alene Mires, [facility]   Vitamin B12 deficiency     Past Surgical History:  Procedure Laterality Date   ABDOMINAL ADHESION SURGERY  01/22/2018   APPENDECTOMY  1989   BILIARY DILATION  11/26/2019   Procedure: BILIARY DILATION;  Surgeon: Jackquline Denmark, MD;  Location: [facility];  Service:  Endoscopy;;   BILIARY DILATION  03/08/2020   Procedure: BILIARY DILATION;  Surgeon: Irving Copas., MD;  Location: [place];  Service: Gastroenterology;;   BIOPSY  03/08/2020   Procedure: BIOPSY;  Surgeon: Irving Copas., MD;  Location: [facility] ENDOSCOPY;  Service: Gastroenterology;;   CHEST WALL RESECTION     right thoracotomy,resection of chest mass with anterior rib and reconstruction using prosthetic mesh and video arthroscopy   CHOLECYSTECTOMY  01/22/2018   COLONOSCOPY  2019   COLONOSCOPY WITH PROPOFOL N/A 11/29/2021   Procedure: COLONOSCOPY WITH PROPOFOL;  Surgeon: Yetta Flock, MD;  Location: [facility];  Service: Gastroenterology;  Laterality: N/A;   ENTEROSTOMY CLOSURE  04/1999   ERCP N/A 11/26/2019   Procedure: ENDOSCOPIC RETROGRADE CHOLANGIOPANCREATOGRAPHY (ERCP);  Surgeon: Jackquline Denmark, MD;  Location: [facility];  Service: Endoscopy;  Laterality: N/A;   ERCP N/A 03/08/2020   Procedure: ENDOSCOPIC RETROGRADE CHOLANGIOPANCREATOGRAPHY (ERCP);  Surgeon: Irving Copas., MD;  Location: [place];  Service: Gastroenterology;  Laterality: N/A;   ESOPHAGOGASTRODUODENOSCOPY N/A 03/08/2020   Procedure: ESOPHAGOGASTRODUODENOSCOPY (EGD);  Surgeon: Irving Copas., MD;  Location: [place];  Service: Gastroenterology;  Laterality: N/A;   EUS N/A 03/08/2020   Procedure: UPPER ENDOSCOPIC ULTRASOUND (EUS) LINEAR;  Surgeon: Irving Copas., MD;  Location: [place];  Service: Gastroenterology;  Laterality: N/A;   ILEOCECETOMY  03/1999   ileocolon resection with abdominal stoma   ILEOSTOMY CLOSURE  2001  IR FLUORO GUIDE CV LINE LEFT  01/07/2020   IR FLUORO GUIDE CV LINE LEFT  03/09/2020   IR FLUORO GUIDE CV LINE LEFT  05/09/2020   IR FLUORO GUIDE CV LINE LEFT  07/20/2020   IR FLUORO GUIDE CV LINE RIGHT  08/05/2020   IR FLUORO GUIDE CV LINE RIGHT  04/03/2021   IR PERC TUN PERIT CATH WO PORT S&I /IMAG  12/05/2021   IR PTA VENOUS EXCEPT DIALYSIS  CIRCUIT  01/07/2020   IR REMOVAL TUN CV CATH W/O FL  08/05/2020   IR REMOVAL TUN CV CATH W/O FL  07/11/2021   IR REMOVAL TUN CV CATH W/O FL  11/29/2021   IR US GUIDE VASC ACCESS LEFT     x 2 06/17/19 and 09/14/2019   IR US GUIDE VASC ACCESS LEFT  12/05/2021   IR US GUIDE VASC ACCESS RIGHT  08/05/2020   KNEE SURGERY     right knee    LAPAROSCOPIC SMALL BOWEL RESECTION  2009   2000-2009.  SB resections for Crohns Disease - now with Short gut   OMENTECTOMY  01/22/2018   PARTIAL HYSTERECTOMY  1984   with LSO   REMOVAL OF STONES  11/26/2019   Procedure: REMOVAL OF STONES;  Surgeon: Jackquline Denmark, MD;  Location: [facility];  Service: Endoscopy;;   REMOVAL OF STONES  03/08/2020   Procedure: REMOVAL OF STONES;  Surgeon: Irving Copas., MD;  Location[facility];  Service: Gastroenterology;;   SALPINGOOPHORECTOMY Left 1984   SALPINGOOPHORECTOMY Right 1990   SERIAL TRANSVERSE ENTEROPLASTY (STEP) - SMALL BOWEL LENGTHENING  01/22/2018   Dr Alene Mires, [facility] - SB length from 120 to 165cm    [place]  2003   SPHINCTEROTOMY  11/26/2019   Procedure: SPHINCTEROTOMY;  Surgeon: Jackquline Denmark, MD;  Location: [facility];  Service: Endoscopy;;   TOTAL ABDOMINAL HYSTERECTOMY  1990   with RSO   UPPER GASTROINTESTINAL ENDOSCOPY      Current Medications: Current Meds  Medication Sig   acetaminophen (TYLENOL) 650 MG CR tablet Take 1,300 mg by mouth every 6 (six) hours.   amLODipine (NORVASC) 10 MG tablet Take 1 tablet by mouth once daily   buPROPion ER (WELLBUTRIN SR) 100 MG 12 hr tablet Take 2 tablets by mouth once daily   Calcium 500 MG tablet Take 500 mg by mouth daily.   carvedilol (COREG) 25 MG tablet Take 1 tablet (25 mg total) by mouth 2 (two) times daily.   cholecalciferol (VITAMIN D3) 25 MCG (1000 UT) tablet Take 1,000 Units by mouth daily.    cycloSPORINE (RESTASIS) 0.05 % ophthalmic emulsion Place 1 drop into both eyes 2 (two)  times daily.   DEXILANT 60 MG capsule Take 1 capsule by mouth once daily   diphenoxylate-atropine (LOMOTIL) 2.5-0.025 MG tablet Take 1 tablet by mouth 2 (two) times daily as needed for diarrhea or loose stools.   DULoxetine (CYMBALTA) 60 MG capsule Take 2 capsules (120 mg total) by mouth daily.   estradiol (ESTRACE) 2 MG tablet Take 1 tablet by mouth once daily   famotidine (PEPCID) 20 MG tablet Take 20 mg by mouth daily as needed for heartburn or indigestion.   furosemide (LASIX) 20 MG tablet Take 20 mg by mouth as needed. swelling   GATTEX 5 MG KIT Inject 1.5 mg into the skin daily.   hydrALAZINE (APRESOLINE) 100 MG tablet Take 1 tablet (100 mg total) by mouth 3 (three) times daily.  HYDROmorphone (DILAUDID) 4 MG tablet Take 4 mg by mouth 4 (four) times daily.   levETIRAcetam (KEPPRA) 500 MG tablet Take 1 tablet (500 mg total) by mouth 2 (two) times daily.   lipase/protease/amylase (CREON) 36000 UNITS CPEP capsule Take 1 capsule (36,000 Units total) by mouth See admin instructions. 36000 units with meals   loperamide (IMODIUM) 2 MG capsule Take 4 mg by mouth 2 (two) times daily as needed for diarrhea or loose stools.   methadone (DOLOPHINE) 5 MG tablet Take 1 tablet (5 mg total) by mouth 5 (five) times daily.   methocarbamol (ROBAXIN) 500 MG tablet Take 1 tablet (500 mg total) by mouth in the morning and at bedtime.   Multiple Vitamins-Minerals (MULTIVITAMIN ADULT PO) Take 1 tablet by mouth daily.   NARCAN 4 MG/0.1ML LIQD nasal spray kit Place 1 spray into the nose once as needed (overdose).   ondansetron (ZOFRAN) 4 MG tablet Take 4 mg by mouth every 8 (eight) hours as needed for nausea or vomiting.   polyvinyl alcohol (LIQUIFILM TEARS) 1.4 % ophthalmic solution Place 1 drop into both eyes daily as needed for dry eyes.   PRESCRIPTION MEDICATION Inject 1 each into the vein daily. Home TPN . [facility] . 1 bag for 12 hours. [phone]   promethazine (PHENERGAN) 25  MG tablet Take 1 tablet (25 mg total) by mouth every 6 (six) hours as needed for nausea or vomiting.   sodium chloride 0.9 % infusion Inject 1,000 mLs into the vein daily. For chronic dehydration   sucralfate (CARAFATE) 1 GM/10ML suspension Take 10 mLs (1 g total) by mouth 2 (two) times daily.   Trace Minerals Cu-Mn-Se-Zn (TRALEMENT IV) Inject 1 mL into the vein See admin instructions. Used in TPN bag 4 times weekly   vitamin B-12 (CYANOCOBALAMIN) 1000 MCG tablet Take 1,000 mcg by mouth daily.    [DISCONTINUED] carvedilol (COREG) 12.5 MG tablet Take 1 tablet (12.5 mg total) by mouth 2 (two) times daily with a meal.     Allergies:   Meperidine, Hyoscyamine, Cefepime, Gabapentin, Lyrica [pregabalin], Topamax [topiramate], Zosyn [piperacillin sod-tazobactam so], Fentanyl, and Morphine and related   Social History   Socioeconomic History   Marital status: Single    Spouse name: Not on file   Number of children: Not on file   Years of education: Not on file   Highest education level: Not on file  Occupational History   Occupation: disabled  Tobacco Use   Smoking status: Former    Packs/day: 0.50    Years: 35.00    Pack years: 17.50    Types: Cigarettes   Smokeless tobacco: Never  Vaping Use   Vaping Use: Never used  Substance and Sexual Activity   Alcohol use: Not Currently   Drug use: Never   Sexual activity: Yes  Other Topics Concern   Not on file  Social History Narrative   Right handed   One story apartment   No caffeine   Social Determinants of Health   Financial Resource Strain: Not on file  Food Insecurity: Not on file  Transportation Needs: Not on file  Physical Activity: Not on file  Stress: Not on file  Social Connections: Not on file    Family History: [name] family history includes Breast cancer in her sister; CAD in her father; Colon cancer in an other family member; Crohn's disease in an other family member; Diabetes in her sister; Glaucoma in her  mother; Heart disease in  her father; Hypertension in her father; Lupus in her sister; Multiple sclerosis in her sister; Seizures in her mother.  History of coronary artery disease notable for father.  ROS:   Please see the history of present illness.    All other systems reviewed and are negative.  EKGs/Labs/Other Studies Reviewed:    The following studies were reviewed today:  EKG:   11/11/20: Sinus Bradycardia with 1st Degree HB, rate 58 with rare PAC, borderline LVH  Cardiac Event Monitoring: Date: 12/27/20 Results: Patient had a minimum heart rate of 49 bpm, maximum heart rate of 120 bpm, and average heart rate of 73 bpm. Predominant underlying rhythm was sinus rhythm. Three run of nonsustained ventricular tachycardia occurred lasting 9 beats at longest with a max rate of 163 bpm at fastest. Isolated PACs were rare (<1.0%). Isolated PVCs were frequent (12.0%). No evidence of complete heart block or atrial fibrillation. Triggered and diary events associated with sinus rhythm, sinus arrhythmia, or PACs.   Frequent, occasionally symptomatic PACs.   Transthoracic Echocardiogram: Date:09/03/20 Results: 1. Left ventricular ejection fraction, by estimation, is 60 to 65%. The  left ventricle has normal function. The left ventricle has no regional  wall motion abnormalities. There is mild concentric left ventricular  hypertrophy. Left ventricular diastolic  parameters were normal.   2. Right ventricular systolic function is normal. The right ventricular  size is normal. There is normal pulmonary artery systolic pressure.   3. Left atrial size was moderately dilated.   4. The mitral valve is normal in structure. No evidence of mitral valve  regurgitation. No evidence of mitral stenosis.   5. The aortic valve is normal in structure. Aortic valve regurgitation is  not visualized. No aortic stenosis is present.   6. The inferior vena cava is normal in size with greater than 50%   respiratory variability, suggesting right atrial pressure of 3 mmHg.   Recent Labs: 05/06/2021: B Natriuretic Peptide 97.4 07/05/2021: TSH 0.285 11/28/2021: Magnesium 2.1 11/30/2021: ALT 156 12/03/2021: BUN 15; Hemoglobin 7.5; Platelets 209; Potassium 3.3; Sodium 137 12/04/2021: Creatinine, Ser 1.98  Recent Lipid Panel    Component Value Date/Time   TRIG 126 11/28/2021 0338     Physical Exam:    VS:  BP (!) 160/76    Pulse 75    Ht 5' 8"  (1.727 m)    Wt 59.4 kg    SpO2 99%    BMI 19.92 kg/m     Wt Readings from Last 3 Encounters:  12/26/21 59.4 kg  12/26/21 59.4 kg  12/20/21 61.7 kg    Gen: no distress  Neck: Prominent JVP Cardiac: No Rubs or Gallops, no Murmur, regular rhythm, +2 radial pulses Respiratory: Clear to auscultation bilaterally, normal effort, normal  respiratory rate GI: Soft, nontender, non-distended  MS: No  edema;  moves all extremities Integument: Skin feels warm Neuro:  At time of evaluation, alert and oriented to person/place/time/situation  Psych: Fatigued affect   ASSESSMENT:    1. Essential hypertension   2. Pancreatic insufficiency   3. Severe protein-calorie malnutrition ([place])     PLAN:    In order of problems listed above:  Essential Hypertension Short Gut syndrome PACs - continue home medications except will increase to coreg 25 mg PO BID - discussed diet (DASH/low sodium), and exercise/weight loss interventions - well compensated from a cardiac standpoint: should be abl to get transplant if needing (potentially at [facility])  Six months   Medication Adjustments/Labs and Tests Ordered: Current medicines are reviewed  at length with the patient today.  Concerns regarding medicines are outlined above.  No orders of the defined types were placed in this encounter.  Meds ordered this encounter  Medications   carvedilol (COREG) 25 MG tablet    Sig: Take 1 tablet (25 mg total) by mouth 2 (two) times daily.    Dispense:  180 tablet    Refill:   3    Patient Instructions  Medication Instructions:  Your physician has recommended you make the following change in your medication:  INCREASE: carvedilol (Coreg) to 25 mg by mouth twice daily  *If you need a refill on your cardiac medications before your next appointment, please call your pharmacy*   Lab Work: NONE If you have labs (blood work) drawn today and your tests are completely normal, you will receive your results only by: [place] (if you have MyChart) OR A paper copy in the mail If you have any lab test that is abnormal or we need to change your treatment, we will call you to review the results.   Testing/Procedures: NONE   Follow-Up: At [facility], you and your health needs are our priority.  As part of our continuing mission to provide you with exceptional heart care, we have created designated Provider Care Teams.  These Care Teams include your primary Cardiologist (physician) and Advanced Practice Providers (APPs -  Physician Assistants and Nurse Practitioners) who all work together to provide you with the care you need, when you need it.    Your next appointment:   6 month(s)  The format for your next appointment:   In Person  Provider:   Werner Lean, MD          Signed, Werner Lean, MD  12/26/2021 4:50 PM    [place]

## 2021-12-26 NOTE — Patient Instructions (Signed)
Medication Instructions:  Your physician has recommended you make the following change in your medication:  INCREASE: carvedilol (Coreg) to 25 mg by mouth twice daily  *If you need a refill on your cardiac medications before your next appointment, please call your pharmacy*   Lab Work: NONE If you have labs (blood work) drawn today and your tests are completely normal, you will receive your results only by: [place] (if you have MyChart) OR A paper copy in the mail If you have any lab test that is abnormal or we need to change your treatment, we will call you to review the results.   Testing/Procedures: NONE   Follow-Up: At [facility], you and your health needs are our priority.  As part of our continuing mission to provide you with exceptional heart care, we have created designated Provider Care Teams.  These Care Teams include your primary Cardiologist (physician) and Advanced Practice Providers (APPs -  Physician Assistants and Nurse Practitioners) who all work together to provide you with the care you need, when you need it.    Your next appointment:   6 month(s)  The format for your next appointment:   In Person  Provider:   Werner Lean, MD

## 2021-12-26 NOTE — Progress Notes (Signed)
[facility]  Patient Active Problem List   Diagnosis Date Noted   PICC (peripherally inserted central catheter) in place 11/27/2021   Pancreatic insufficiency 11/27/2021   SBO (small bowel obstruction) ([place]) 10/08/2021   Vertebral osteomyelitis ([place])    Bloodstream infection due to central venous catheter    Thrombocytopenia ([name])    Malnutrition of moderate degree 07/06/2021   Transaminitis    Hypophosphatemia    SIRS (systemic inflammatory response syndrome) ([name]) 06/20/2021   Chronic idiopathic constipation    Left shoulder pain 05/23/2021   Elevated LFTs    Sepsis ([place]) 05/06/2021   Acidosis 04/08/2021   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   Intractable nausea and vomiting 03/11/2021   Thoracic discitis 02/10/2021   Pressure injury of skin 12/18/2020   Bacteremia 12/17/2020   Seizures ([place]) 12/17/2020   Drug-seeking behavior 12/16/2020   Seizure ([place]) 12/15/2020   Stage 4 chronic kidney disease ([place]) 12/06/2020   COVID-19 virus infection 12/06/2020   Protein calorie malnutrition ([place]) 12/06/2020   Palpitations 11/22/2020   PAC (premature atrial contraction) 11/22/2020   History of central line-associated bloodstream infection (CLABSI) 10/21/2020   Nausea & vomiting 10/21/2020   Choledocholithiasis (sludge) s/p ERCP 10/2019 10/21/2020   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   Abdominal pain 34/19/6222   Acute metabolic encephalopathy    Hypomagnesemia    Partial small bowel obstruction ([name])    Enterobacter sepsis ([place]) 07/22/2020   Fever of unknown origin 06/27/2020   Anxiety 06/03/2020   Acute pancreatitis 04/13/2020   Infection due to Acinetobacter species 03/10/2020   Pancytopenia ([place]) 03/05/2020   Acute renal failure superimposed on stage 3b chronic kidney disease ([place]) 03/05/2020   Central line infection    Elevated liver enzymes 01/02/2020   Cholangitis    Anasarca 10/28/2019   Falls 10/28/2019   Malnutrition  ([place])    Vitamin B12 deficiency    GERD (gastroesophageal reflux disease)    Chronic pain syndrome    Hypokalemia due to excessive gastrointestinal loss of potassium 10/13/2019   Fever    Hypokalemia 10/12/2019   Chronic diarrhea 10/12/2019   Dysuria 10/12/2019   Bilateral lower extremity edema 10/12/2019   Hypertension 10/12/2019   Sepsis due to Candida ([place]) 08/27/2019   IDA (iron deficiency anemia) 11/03/2018   Dilation of biliary tract 08/28/2018   Severe diarrhea 03/07/2018   LFTs abnormal 01/27/2018   GI tract obstruction ([place]) 01/27/2018   Chronic kidney disease, stage 3b ([place]) 11/30/2017   Bacteremia due to Klebsiella pneumoniae 09/13/2017   HTN (hypertension), benign 12/02/2016   Intractable pain 12/02/2016   Anemia 12/02/2016   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   Avascular necrosis ([place]) 06/14/2015   Polyarthralgia 05/03/2015   On total parenteral nutrition (TPN) 12/30/2014   Osteoporosis 12/24/2014   Low back pain 12/16/2014   Vitamin D deficiency 12/16/2014   Short gut syndrome 07/15/2014   History of colonic diverticulitis 2014   Depression 07/24/2012   Gram-positive bacteremia 07/24/2012   Small bowel obstruction due to adhesions ([place]) 07/24/2012   Diarrhea 12/13/2011   Crohn's disease ([place]) 06/01/2011   Neuralgia and neuritis 06/01/2011   Myalgia and myositis 08/12/2003   Crohn's disease of both small and large intestine with other complication ([place]) 9798      Subjective:    Patient ID: Caitlin Peters, female    DOB: [birth year], 61 y.o.   MRN: [id number]  Chief Complaint  Patient presents with   Follow-up  Abdominal pain today,   ° ° °HPI: ° °Caitlin Peters is a 60 y.o. female crohn's s/p colectomy, short gut, recurrent line associated infection, here for f/u same ° °Most recent infection was candida lusitanea °Line removed on right femoral area and placed in left femoral area °Had lft elevation resolved °Tte no changes or concern of  endocarditis ° °Had ophthalmologic outpatient exam no concern for eye involvement with fungemia ° °Finished antibiotics ° °Chronic left abd pain not similar to prior tell-tale of bsi/sepsis ° °Feels well ° °Wants bivalent vaccine ° ° °Allergies  °Allergen Reactions  ° Meperidine Hives  °  Other reaction(s): GI Upset °Due to [place] °  ° Hyoscyamine Hives and Swelling  °  Legs swelling °Disorientation  ° Cefepime Other (See Comments)  °  Neurotoxicity occurring in setting of AKI. Ceftriaxone tolerated during same admit  ° Gabapentin Other (See Comments)  °  unknown  ° Lyrica [Pregabalin] Other (See Comments)  °  Has chronic dehydration and made it worse  ° Topamax [Topiramate] Other (See Comments)  °  Has chronic dehydration and made it worse  ° Zosyn [Piperacillin Sod-Tazobactam So] Other (See Comments)  °  Patient reports it makes her vomit, her neck stiff, and her "heart feel funny" °Affected kidneys  ° Fentanyl Rash  °  Pt is allergic to fentanyl patch related to the glue (gives her a rash) °Pt states she is NOT allergic to fentanyl IV medicine  ° Morphine And Related Rash  ° ° ° ° °Outpatient Medications Prior to Visit  °Medication Sig Dispense Refill  ° acetaminophen (TYLENOL) 650 MG CR tablet Take 1,300 mg by mouth every 6 (six) hours.    ° amLODipine (NORVASC) 10 MG tablet Take 1 tablet by mouth once daily (Patient taking differently: Take 10 mg by mouth daily.) 90 tablet 0  ° buPROPion ER (WELLBUTRIN SR) 100 MG 12 hr tablet Take 2 tablets by mouth once daily (Patient taking differently: Take 200 mg by mouth daily.) 60 tablet 2  ° Calcium 500 MG tablet Take 500 mg by mouth daily.    ° carvedilol (COREG) 12.5 MG tablet Take 1 tablet (12.5 mg total) by mouth 2 (two) times daily with a meal. 60 tablet 0  ° cholecalciferol (VITAMIN D3) 25 MCG (1000 UT) tablet Take 1,000 Units by mouth daily.     ° cycloSPORINE (RESTASIS) 0.05 % ophthalmic emulsion Place 1 drop into both eyes 2 (two) times daily.    ° DEXILANT 60  MG capsule Take 1 capsule by mouth once daily 90 capsule 0  ° diphenoxylate-atropine (LOMOTIL) 2.5-0.025 MG tablet Take 1 tablet by mouth 2 (two) times daily as needed for diarrhea or loose stools.    ° DULoxetine (CYMBALTA) 60 MG capsule Take 2 capsules (120 mg total) by mouth daily. 180 capsule 1  ° estradiol (ESTRACE) 2 MG tablet Take 1 tablet by mouth once daily (Patient taking differently: Take 2 mg by mouth daily.) 90 tablet 0  ° famotidine (PEPCID) 20 MG tablet Take 20 mg by mouth daily as needed for heartburn or indigestion.    ° GATTEX 5 MG KIT Inject 1.5 mg into the skin daily.    ° hydrALAZINE (APRESOLINE) 100 MG tablet Take 1 tablet (100 mg total) by mouth 3 (three) times daily. 90 tablet 1  ° HYDROmorphone (DILAUDID) 4 MG tablet Take 4 mg by mouth 4 (four) times daily.    ° levETIRAcetam (KEPPRA) 500 MG tablet Take 1 tablet (500 mg   total) by mouth 2 (two) times daily. 60 tablet 11  ° lipase/protease/amylase (CREON) 36000 UNITS CPEP capsule Take 1 capsule (36,000 Units total) by mouth See admin instructions. 36000 units with meals (Patient taking differently: Take 36,000 Units by mouth 3 (three) times daily with meals.)    ° loperamide (IMODIUM) 2 MG capsule Take 4 mg by mouth 2 (two) times daily as needed for diarrhea or loose stools.    ° methadone (DOLOPHINE) 5 MG tablet Take 1 tablet (5 mg total) by mouth 5 (five) times daily. 30 tablet 0  ° methocarbamol (ROBAXIN) 500 MG tablet Take 1 tablet (500 mg total) by mouth in the morning and at bedtime. 60 tablet 1  ° Multiple Vitamins-Minerals (MULTIVITAMIN ADULT PO) Take 1 tablet by mouth daily.    ° ondansetron (ZOFRAN) 4 MG tablet Take 4 mg by mouth every 8 (eight) hours as needed for nausea or vomiting.    ° polyvinyl alcohol (LIQUIFILM TEARS) 1.4 % ophthalmic solution Place 1 drop into both eyes daily as needed for dry eyes.    ° PRESCRIPTION MEDICATION Inject 1 each into the vein daily. Home TPN . [facility] in [place] . 1 bag for  12 hours. °[phone]    ° promethazine (PHENERGAN) 25 MG tablet Take 1 tablet (25 mg total) by mouth every 6 (six) hours as needed for nausea or vomiting. 90 tablet 0  ° sodium chloride 0.9 % infusion Inject 1,000 mLs into the vein daily. For chronic dehydration    ° sucralfate (CARAFATE) 1 GM/10ML suspension Take 10 mLs (1 g total) by mouth 2 (two) times daily. 420 mL 2  ° Trace Minerals Cu-Mn-Se-Zn (TRALEMENT IV) Inject 1 mL into the vein See admin instructions. Used in TPN bag 4 times weekly    ° vitamin B-12 (CYANOCOBALAMIN) 1000 MCG tablet Take 1,000 mcg by mouth daily.     ° NARCAN 4 MG/0.1ML LIQD nasal spray kit Place 1 spray into the nose once as needed (overdose). (Patient not taking: Reported on 12/20/2021)    ° °No facility-administered medications prior to visit.  ° ° ° °Social History  ° °Socioeconomic History  ° Marital status: Single  °  Spouse name: Not on file  ° Number of children: Not on file  ° Years of education: Not on file  ° Highest education level: Not on file  °Occupational History  ° Occupation: disabled  °Tobacco Use  ° Smoking status: Former  °  Packs/day: 0.50  °  Years: 35.00  °  Pack years: 17.50  °  Types: Cigarettes  ° Smokeless tobacco: Never  °Vaping Use  ° Vaping Use: Never used  °Substance and Sexual Activity  ° Alcohol use: Not Currently  ° Drug use: Never  ° Sexual activity: Yes  °Other Topics Concern  ° Not on file  °Social History Narrative  ° Right handed  ° One story apartment  ° No caffeine  ° °Social Determinants of Health  ° °Financial Resource Strain: Not on file  °Food Insecurity: Not on file  °Transportation Needs: Not on file  °Physical Activity: Not on file  °Stress: Not on file  °Social Connections: Not on file  °Intimate Partner Violence: Not on file  ° ° ° ° °Review of Systems °   °All other ROS negative ° °Objective:  °  °BP (!) 150/77    Pulse 72    Temp 98.1 °F (36.7 °C) (Oral)    Wt 131 lb (59.4 kg)      SpO2 98%    BMI 19.92 kg/m²  °Nursing note and vital  signs reviewed. ° °Physical Exam ° °   °General/constitutional: no distress, pleasant °HEENT: Normocephalic, PER, Conj Clear, EOMI, Oropharynx clear °Neck supple °CV: rrr no mrg °Lungs: clear to auscultation, normal respiratory effort °Abd: Soft, Nontender °Ext: no edema °Skin: No Rash °Neuro: nonfocal °MSK: no peripheral joint swelling/tenderness/warmth; back spines nontender ° ° °Left fem line site no erythema/tenderness/fluctuance ° °Labs: °Lab Results  °Component Value Date  ° WBC 3.1 (L) 12/03/2021  ° HGB 7.5 (L) 12/03/2021  ° HCT 23.0 (L) 12/03/2021  ° MCV 93.5 12/03/2021  ° PLT 209 12/03/2021  ° °Last metabolic panel °Lab Results  °Component Value Date  ° GLUCOSE 90 12/03/2021  ° NA 137 12/03/2021  ° K 3.3 (L) 12/03/2021  ° CL 108 12/03/2021  ° CO2 20 (L) 12/03/2021  ° BUN 15 12/03/2021  ° CREATININE 1.98 (H) 12/04/2021  ° GFRNONAA 28 (L) 12/04/2021  ° CALCIUM 9.1 12/03/2021  ° PHOS 3.9 11/28/2021  ° PROT 5.6 (L) 11/30/2021  ° ALBUMIN 2.3 (L) 11/30/2021  ° BILITOT 0.7 11/30/2021  ° ALKPHOS 311 (H) 11/30/2021  ° AST 32 11/30/2021  ° ALT 156 (H) 11/30/2021  ° ANIONGAP 9 12/03/2021  ° ° °Micro: ° °Serology: ° °Imaging: ° °Assessment & Plan:  ° °Problem List Items Addressed This Visit   ° °  ° Other  ° History of central line-associated bloodstream infection (CLABSI) - Primary  ° °Other Visit Diagnoses   ° ° Candidemia ([name])      ° °  ° ° ° ° °No orders of the defined types were placed in this encounter. 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 Has similar presentation with blood stream infection previously. GI follow as well to r/o crohn's flare °  °She just finished 2 weeks of  abx lock and iv levofloxacin for pseudomonas putida and steno CLABSI, and just prior to that has guidewire catheter exchange of the right femoral line as it wasn't functioning, just after finishing another 2 weeks abx lock therapy/systemic abx for acinetobacter/klebsiella bsi °  °She is awared of progressive limitation of line access. At this time she continues to desire full treatment; she is functional/healthy otherwise. °  °Reviewed line care with her -- appears she is cooperating with HH and doing what she is told to do °  °Difficult situation °  °  °Of note, she recently has very high lft elevation, which is much better now. She had a liver biopsy by IR on 12/20 that showed mild nonspecific portal inflammation and nonspecific scattered intracytoplasmic inclusion similar to [place] bodies-like inclusions (ddx include crohns, drugs, hep b, gvhd, dm2 related), but no fibrosis °  °---------- °1/05 assessment °S/p endoscopy 1/04 --> no sign active crohn's disease °Right femoral line removed 1/04 °Bcx from 1/04 ngtd °Doing well. Afebrile °Tolerating PO intake. Improving diarrhea/abd pain °1/04 tte no new changes from   prior echo or evidence endocarditis Continues to have no problem with vision   1/31 assessment No eye involvement Left femoral central line placed during early 11/2021 admission and no issue Finished antifungal 2 weeks prior - no evidence recrudescence of sepsis    Plan: -clinically no evidence infection at this time -ok to recommend bivalent covid booster -continue line care and hh care as previous -no need for id f/u  Follow-up: Return if symptoms worsen or fail to improve.      Jabier Mutton, [facility] for [place] [phone]  pager   [phone] cell 12/26/2021, 11:33 AM

## 2021-12-29 ENCOUNTER — Other Ambulatory Visit: Payer: Self-pay | Admitting: Internal Medicine

## 2022-01-13 ENCOUNTER — Emergency Department (HOSPITAL_COMMUNITY): Payer: Medicare Other

## 2022-01-13 ENCOUNTER — Encounter (HOSPITAL_COMMUNITY): Payer: Self-pay

## 2022-01-13 ENCOUNTER — Emergency Department (HOSPITAL_COMMUNITY)
Admission: EM | Admit: 2022-01-13 | Discharge: 2022-01-14 | Disposition: A | Discharge status: Home / Self Care | Payer: Medicare Other | Attending: Emergency Medicine | Admitting: Emergency Medicine

## 2022-01-13 ENCOUNTER — Other Ambulatory Visit: Payer: Self-pay

## 2022-01-13 DIAGNOSIS — I10 Essential (primary) hypertension: Secondary | ICD-10-CM | POA: Diagnosis not present

## 2022-01-13 DIAGNOSIS — K5090 Crohn's disease, unspecified, without complications: Secondary | ICD-10-CM | POA: Insufficient documentation

## 2022-01-13 DIAGNOSIS — R030 Elevated blood-pressure reading, without diagnosis of hypertension: Secondary | ICD-10-CM

## 2022-01-13 DIAGNOSIS — R770 Abnormality of albumin: Secondary | ICD-10-CM | POA: Insufficient documentation

## 2022-01-13 DIAGNOSIS — Z79899 Other long term (current) drug therapy: Secondary | ICD-10-CM | POA: Insufficient documentation

## 2022-01-13 DIAGNOSIS — Z20822 Contact with and (suspected) exposure to covid-19: Secondary | ICD-10-CM | POA: Insufficient documentation

## 2022-01-13 DIAGNOSIS — R7989 Other specified abnormal findings of blood chemistry: Secondary | ICD-10-CM | POA: Diagnosis not present

## 2022-01-13 DIAGNOSIS — K6389 Other specified diseases of intestine: Secondary | ICD-10-CM | POA: Insufficient documentation

## 2022-01-13 DIAGNOSIS — D530 Protein deficiency anemia: Secondary | ICD-10-CM | POA: Insufficient documentation

## 2022-01-13 DIAGNOSIS — D649 Anemia, unspecified: Secondary | ICD-10-CM | POA: Insufficient documentation

## 2022-01-13 DIAGNOSIS — M87052 Idiopathic aseptic necrosis of left femur: Secondary | ICD-10-CM | POA: Diagnosis not present

## 2022-01-13 DIAGNOSIS — D696 Thrombocytopenia, unspecified: Secondary | ICD-10-CM | POA: Insufficient documentation

## 2022-01-13 DIAGNOSIS — R112 Nausea with vomiting, unspecified: Secondary | ICD-10-CM | POA: Diagnosis present

## 2022-01-13 LAB — URINALYSIS, ROUTINE W REFLEX MICROSCOPIC
Bacteria, UA: NONE SEEN
Bilirubin Urine: NEGATIVE
Glucose, UA: NEGATIVE mg/dL
Hgb urine dipstick: NEGATIVE
Ketones, ur: NEGATIVE mg/dL
Leukocytes,Ua: NEGATIVE
Nitrite: NEGATIVE
Protein, ur: 100 mg/dL — AB
Specific Gravity, Urine: 1.01 (ref 1.005–1.030)
pH: 8 (ref 5.0–8.0)

## 2022-01-13 LAB — CBC WITH DIFFERENTIAL/PLATELET
Abs Immature Granulocytes: 0 10*3/uL (ref 0.00–0.07)
Basophils Absolute: 0 10*3/uL (ref 0.0–0.1)
Basophils Relative: 1 %
Eosinophils Absolute: 0.1 10*3/uL (ref 0.0–0.5)
Eosinophils Relative: 2 %
HCT: 24 % — ABNORMAL LOW (ref 36.0–46.0)
Hemoglobin: 7.8 g/dL — ABNORMAL LOW (ref 12.0–15.0)
Immature Granulocytes: 0 %
Lymphocytes Relative: 40 %
Lymphs Abs: 1.5 10*3/uL (ref 0.7–4.0)
MCH: 30.1 pg (ref 26.0–34.0)
MCHC: 32.5 g/dL (ref 30.0–36.0)
MCV: 92.7 fL (ref 80.0–100.0)
Monocytes Absolute: 0.3 10*3/uL (ref 0.1–1.0)
Monocytes Relative: 7 %
Neutro Abs: 1.9 10*3/uL (ref 1.7–7.7)
Neutrophils Relative %: 50 %
Platelets: 147 10*3/uL — ABNORMAL LOW (ref 150–400)
RBC: 2.59 MIL/uL — ABNORMAL LOW (ref 3.87–5.11)
RDW: 14.6 % (ref 11.5–15.5)
WBC: 3.8 10*3/uL — ABNORMAL LOW (ref 4.0–10.5)
nRBC: 0 % (ref 0.0–0.2)

## 2022-01-13 LAB — COMPREHENSIVE METABOLIC PANEL
ALT: 25 U/L (ref 0–44)
AST: 17 U/L (ref 15–41)
Albumin: 2.9 g/dL — ABNORMAL LOW (ref 3.5–5.0)
Alkaline Phosphatase: 82 U/L (ref 38–126)
Anion gap: 6 (ref 5–15)
BUN: 41 mg/dL — ABNORMAL HIGH (ref 6–20)
CO2: 24 mmol/L (ref 22–32)
Calcium: 8.5 mg/dL — ABNORMAL LOW (ref 8.9–10.3)
Chloride: 108 mmol/L (ref 98–111)
Creatinine, Ser: 1.67 mg/dL — ABNORMAL HIGH (ref 0.44–1.00)
GFR, Estimated: 35 mL/min — ABNORMAL LOW (ref >60)
Glucose, Bld: 91 mg/dL (ref 70–99)
Potassium: 3.8 mmol/L (ref 3.5–5.1)
Sodium: 138 mmol/L (ref 135–145)
Total Bilirubin: 0.4 mg/dL (ref 0.3–1.2)
Total Protein: 6.4 g/dL — ABNORMAL LOW (ref 6.5–8.1)

## 2022-01-13 LAB — RESP PANEL BY RT-PCR (FLU A&B, COVID) ARPGX2
Influenza A by PCR: NEGATIVE
Influenza B by PCR: NEGATIVE
SARS Coronavirus 2 by RT PCR: NEGATIVE

## 2022-01-13 MED ORDER — METOCLOPRAMIDE HCL 5 MG/ML IJ SOLN
10.0000 mg | Freq: Once | INTRAMUSCULAR | Status: AC
  Administered 2022-01-13: 10 mg via INTRAVENOUS
  Filled 2022-01-13: qty 2

## 2022-01-13 MED ORDER — HYDROMORPHONE HCL 1 MG/ML IJ SOLN
0.5000 mg | Freq: Once | INTRAMUSCULAR | Status: AC
  Administered 2022-01-13: 0.5 mg via INTRAVENOUS
  Filled 2022-01-13: qty 1

## 2022-01-13 MED ORDER — LACTATED RINGERS IV BOLUS
1000.0000 mL | Freq: Once | INTRAVENOUS | Status: AC
  Administered 2022-01-13: 1000 mL via INTRAVENOUS

## 2022-01-13 MED ORDER — ONDANSETRON HCL 4 MG/2ML IJ SOLN
4.0000 mg | Freq: Once | INTRAMUSCULAR | Status: AC
  Administered 2022-01-13: 4 mg via INTRAVENOUS
  Filled 2022-01-13: qty 2

## 2022-01-13 NOTE — ED Provider Triage Note (Signed)
Emergency Medicine Provider Triage Evaluation Note  Caitlin Peters , a 61 y.o. female  was evaluated in triage.  Pt complains of generalized chronic abdominal pain over the past couple days.  Patient has associated fever, nausea, vomiting.  Patient has a history of Crohn's. Denies chest pain, shortness of breath, constipation.  Patient took her p.o. Dilaudid at 3 AM.  Review of Systems  Positive: As per HPI above Negative: As per HPI above  Physical Exam  BP (!) 166/74 (BP Location: Left Arm)    Pulse 72    Temp 98 F (36.7 C) (Oral)    Resp 18    SpO2 97%  Gen:   Awake, uncomfortable appearing Resp:  Normal effort  MSK:   Moves extremities without difficulty  Other:  Port in place to left anterior thigh without erythema surrounding or dehiscence.  Generalized tenderness to palpation of abdomen.  Medical Decision Making  Medically screening exam initiated at 5:02 PM.  Appropriate orders placed.  Caitlin Peters was informed that the remainder of the evaluation will be completed by another provider, this initial triage assessment does not replace that evaluation, and the importance of remaining in the ED until their evaluation is complete.  5:18 PM - Discussed with RN that patient is in need of a room immediately. RN aware and working on room placement.    Tyrae Alcoser A, PA-C 01/13/22 1718

## 2022-01-13 NOTE — ED Triage Notes (Signed)
Pt BIB EMS from home. Pt endorses generalized chronic pain over the past few days. She also reports fever and N/V. Pts family reports similar symptoms in the past with TPN port infection. A&O to baseline.   CBG 109 BP 150/88 HR 72 RR 18 SpO2 99% RA

## 2022-01-13 NOTE — ED Provider Notes (Signed)
[place] DEPT Provider Note   CSN: [id number] Arrival date & time: 01/13/22  1610     History Chief Complaint  Patient presents with   Generalized Body Aches   Emesis    Caitlin Peters is a 61 y.o. 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  She reports she was seen about this issue at [facility] by her GI doctor on 2/14 and was told there was really nothing that he could do about it.  She reports she has had 7 abdominal surgeries in the past and is on the list for a small bowel transplant.  She reports she is afraid that she may have another abscess or small bowel obstruction.  She denies any hematochezia or melena.  Denies any hematemesis.  She reports subjective fever at home.  Denies any urinary symptoms.  Denies any chest pain or shortness of breath.  Patient reports she did not give herself fluids today with her TPN and is requesting some.   Emesis Associated symptoms: abdominal pain, diarrhea and fever   Associated symptoms: no chills and no myalgias       Home Medications Prior to Admission medications   Medication Sig Start Date End Date Taking? Authorizing Provider  acetaminophen (TYLENOL) 650 MG CR tablet Take 1,300 mg by mouth every 6 (six) hours.    [provider]  amLODipine (NORVASC) 10 MG tablet Take 1 tablet by mouth once daily 12/29/21   [name], [name], MD  buPROPion [facility] SR) 100 MG 12 hr tablet Take 2 tablets by mouth once daily 08/30/21   [name], [name], MD  Calcium 500 MG tablet Take 500 mg by mouth daily.    [provider]  carvedilol (COREG) 25 MG tablet Take 1 tablet (25 mg total) by mouth 2 (two) times daily. 12/26/21   Werner Lean, MD  cholecalciferol (VITAMIN D3) 25 MCG (1000 UT) tablet Take 1,000 Units by mouth daily.      [provider]  cycloSPORINE (RESTASIS) 0.05 % ophthalmic emulsion Place 1 drop into both eyes 2 (two) times daily.    [provider]  DEXILANT 60 MG capsule Take 1 capsule by mouth once daily 11/21/21   [name], [name], MD  diphenoxylate-atropine (LOMOTIL) 2.5-0.025 MG tablet Take 1 tablet by mouth 2 (two) times daily as needed for diarrhea or loose stools. 09/04/21   [provider]  DULoxetine (CYMBALTA) 60 MG capsule Take 2 capsules (120 mg total) by mouth daily. 09/15/21   [name], [name], MD  estradiol (ESTRACE) 2 MG tablet Take 1 tablet by mouth once daily 10/03/21   [name], [name], MD  famotidine (PEPCID) 20 MG tablet Take 20 mg by mouth daily as needed for heartburn or indigestion.    [provider]  furosemide (LASIX) 20 MG tablet Take 20 mg by mouth as needed. swelling 12/22/21   [provider]  GATTEX 5 MG KIT Inject 1.5 mg into the skin daily. 01/03/21   [provider]  hydrALAZINE (APRESOLINE) 100 MG tablet Take 1 tablet (100 mg total) by mouth 3 (three) times daily. 11/07/21   [name], [name], MD  HYDROmorphone (DILAUDID) 4 MG tablet Take 4 mg by mouth 4 (four) times daily. 04/18/21   [provider]  levETIRAcetam (KEPPRA) 500 MG tablet  Take 1 tablet (500 mg total) by mouth 2 (two) times daily. 08/28/21   Cameron Sprang, MD  lipase/protease/amylase (CREON) 36000 UNITS CPEP capsule Take 1 capsule (36,000 Units total) by mouth See admin instructions. 36000 units with meals 06/30/21   Eugenie Filler, MD  loperamide (IMODIUM) 2 MG capsule Take 4 mg by mouth 2 (two) times daily as needed for diarrhea or loose stools. 09/26/21   [provider]  methadone (DOLOPHINE) 5 MG tablet Take 1 tablet (5 mg total) by mouth 5 (five) times daily. 09/29/21   Little Ishikawa, MD  methocarbamol (ROBAXIN) 500 MG tablet Take 1 tablet (500 mg total) by mouth in the morning and at bedtime.  12/20/21   [name], [name], MD  Multiple Vitamins-Minerals (MULTIVITAMIN ADULT PO) Take 1 tablet by mouth daily.    [provider]  NARCAN 4 MG/0.1ML LIQD nasal spray kit Place 1 spray into the nose once as needed (overdose). 10/14/20   [provider]  ondansetron (ZOFRAN) 4 MG tablet Take 4 mg by mouth every 8 (eight) hours as needed for nausea or vomiting. 11/18/21   [provider]  polyvinyl alcohol (LIQUIFILM TEARS) 1.4 % ophthalmic solution Place 1 drop into both eyes daily as needed for dry eyes.    [provider]  PRESCRIPTION MEDICATION Inject 1 each into the vein daily. Home TPN . [facility] [place] . 1 bag for 12 hours. [phone]    [provider]  promethazine (PHENERGAN) 25 MG tablet Take 1 tablet (25 mg total) by mouth every 6 (six) hours as needed for nausea or vomiting. 12/20/21   [name], [name], MD  sodium chloride 0.9 % infusion Inject 1,000 mLs into the vein daily. For chronic dehydration 10/16/20   [provider]  sucralfate (CARAFATE) 1 GM/10ML suspension Take 10 mLs (1 g total) by mouth 2 (two) times daily. 06/30/21   Eugenie Filler, MD  Trace Minerals Cu-Mn-Se-Zn (TRALEMENT IV) Inject 1 mL into the vein See admin instructions. Used in TPN bag 4 times weekly    [provider]  vitamin B-12 (CYANOCOBALAMIN) 1000 MCG tablet Take 1,000 mcg by mouth daily.     [provider]      Allergies    Meperidine, Hyoscyamine, Cefepime, Gabapentin, Lyrica [pregabalin], Topamax [topiramate], Zosyn [piperacillin sod-tazobactam so], Fentanyl, and Morphine and related    Review of Systems   Review of Systems  Constitutional:  Positive for fever. Negative for chills.  Respiratory:  Negative for shortness of breath.   Cardiovascular:  Negative for chest pain.  Gastrointestinal:  Positive for abdominal distention, abdominal pain, diarrhea, nausea and vomiting. Negative  for anal bleeding and blood in stool.  Genitourinary:  Negative for dysuria and hematuria.  Musculoskeletal:  Negative for myalgias.   Physical Exam Updated Vital Signs BP (!) 184/69    Pulse 66    Temp 98 F (36.7 C) (Oral)    Resp 18    SpO2 99%  Physical Exam Vitals and nursing note reviewed.  Constitutional:      General: She is not in acute distress.    Appearance: Normal appearance. She is not ill-appearing or toxic-appearing.  HENT:     Head: Normocephalic and atraumatic.     Mouth/Throat:     Mouth: Mucous membranes are moist.     Pharynx: No oropharyngeal exudate or posterior oropharyngeal erythema.  Eyes:     General: No scleral icterus. Cardiovascular:  Rate and Rhythm: Normal rate and regular rhythm.     Pulses: Normal pulses.  Pulmonary:     Effort: Pulmonary effort is normal.     Breath sounds: Normal breath sounds.  Abdominal:     General: Abdomen is flat. Bowel sounds are normal. There is no distension.     Palpations: Abdomen is soft.     Tenderness: There is abdominal tenderness. There is no guarding or rebound.     Comments: Multiple faint surgical scars overlying abdomen.  Tenderness to the lower abdomen without any guarding or rebound.  Normal active bowel sounds. Non-distended.  Musculoskeletal:        General: No deformity.     Cervical back: Normal range of motion.  Skin:    General: Skin is warm and dry.     Comments: Catheter in place in the left anterior upper thigh without any erythema, induration, or fluctuance visualized or palpated.  No drainage noted.  Neurological:     General: No focal deficit present.     Mental Status: She is alert. Mental status is at baseline.    ED Results / Procedures / Treatments   Labs (all labs ordered are listed, but only abnormal results are displayed) Labs Reviewed  COMPREHENSIVE METABOLIC PANEL - Abnormal; Notable for the following components:      Result Value   BUN 41 (*)    Creatinine, Ser 1.67 (*)     Calcium 8.5 (*)    Total Protein 6.4 (*)    Albumin 2.9 (*)    GFR, Estimated 35 (*)    All other components within normal limits  CBC WITH DIFFERENTIAL/PLATELET - Abnormal; Notable for the following components:   WBC 3.8 (*)    RBC 2.59 (*)    Hemoglobin 7.8 (*)    HCT 24.0 (*)    Platelets 147 (*)    All other components within normal limits  URINALYSIS, ROUTINE W REFLEX MICROSCOPIC - Abnormal; Notable for the following components:   Protein, ur 100 (*)    All other components within normal limits  RESP PANEL BY RT-PCR (FLU A&B, COVID) ARPGX2    EKG None  Radiology CT ABDOMEN PELVIS WO CONTRAST  Result Date: 01/13/2022 CLINICAL DATA:  Acute on chronic abdominal pain. Bowel obstruction suspected. History of Crohn's disease. EXAM: CT ABDOMEN AND PELVIS WITHOUT CONTRAST TECHNIQUE: Multidetector CT imaging of the abdomen and pelvis was performed following the standard protocol without IV contrast. RADIATION DOSE REDUCTION: This exam was performed according to the departmental dose-optimization program which includes automated exposure control, adjustment of the mA and/or kV according to patient size and/or use of iterative reconstruction technique. COMPARISON:  Abdominopelvic CT 11/27/2021 FINDINGS: Lower chest: Mild cardiomegaly. No acute airspace disease or pleural effusion. Hepatobiliary: Previous pneumobilia has near completely resolved. High-density material within the biliary tree is also no longer seen. There may be mild periportal edema. No focal liver lesion is seen. Gallbladder is not visualized, presumably surgically absent. Pancreas: No inflammatory change. More detailed assessment is limited on this unenhanced exam as well as paucity of intra-abdominal fat. Spleen: Normal in size without focal abnormality. Adrenals/Urinary Tract: The adrenal glands are difficult to delineate. There is no hydronephrosis or renal calculi. No perinephric edema. Unremarkable urinary bladder.  Stomach/Bowel: Bowel assessment is limited in the absence of contrast and paucity of intra-abdominal fat. Multiple enteric sutures are present. There is wall thickening of the proximal sigmoid as well as rectosigmoid colon. Similar findings were seen on prior CT.  Suspected progression of colonic wall thickening to now involve the descending colon. Small bowel assessment is particularly limited, scattered fluid-filled small bowel loops in a nonobstructive pattern. No evidence of bowel obstruction. Vascular/Lymphatic: Left femoral venous catheter extends into the IVC terminating at the atrial IVC junction. Aortic atherosclerosis without aortic aneurysm. Limited assessment for adenopathy on this unenhanced exam, no obvious bulky enlarged lymph nodes. Reproductive: Hysterectomy.  No adnexal mass. Other: Generalized edema of the mesenteric and intra-abdominal fat. Otherwise paucity of subcutaneous body fat. No ascites, free air, evident focal fluid collection. Musculoskeletal: Avascular necrosis of left femoral head. Bone island in the left iliac bone. No acute osseous abnormalities. IMPRESSION: 1. Progressive colonic wall thickening from prior CT. Detailed bowel assessment is limited. Fluid-filled small bowel in a nonobstructive pattern. Multiple enteric sutures. 2. Previous pneumobilia has near completely resolved. High-density material within the biliary tree is also no longer seen. 3. Left femoral venous catheter extends into the IVC terminating at the atrial IVC junction. 4. Avascular necrosis of left femoral head. Aortic Atherosclerosis (ICD10-I70.0). Electronically Signed   By: Keith Rake M.D.   On: 01/13/2022 21:03    Procedures Procedures   Medications Ordered in ED Medications  lactated ringers bolus 1,000 mL (0 mLs Intravenous Stopped 01/14/22 0223)  ondansetron (ZOFRAN) injection 4 mg (4 mg Intravenous Given 01/13/22 2044)  HYDROmorphone (DILAUDID) injection 0.5 mg (0.5 mg Intravenous Given  01/13/22 2041)  HYDROmorphone (DILAUDID) injection 0.5 mg (0.5 mg Intravenous Given 01/13/22 2345)  metoCLOPramide (REGLAN) injection 10 mg (10 mg Intravenous Given 01/13/22 2345)  HYDROmorphone (DILAUDID) injection 0.5 mg (0.5 mg Intravenous Given 01/14/22 0149)    ED Course/ Medical Decision Making/ A&P                           Medical Decision Making Amount and/or Complexity of Data Reviewed Radiology: ordered.  Risk Prescription drug management.   61 year old female with history of Crohn's, short gut syndrome, on TPN presents emergency department for evaluation of lower abdominal pain with distention, nausea, vomiting, diarrhea as well as subjective fevers the past 3 days.  Differential diagnosis includes but is not limited to Crohn's flare, SBO, abscess, diverticulitis, malignancy.  Vital signs show hypertension.  Afebrile, normal pulse rate, satting well on room air.  Physical exam shows soft, nondistended abdomen with multiple faint surgical scars. Tenderness to the lower abdomen without any guarding or rebound.  Normal active bowel sounds.  Will order basic labs and CT abdomen to rule out any infection.  I independently reviewed and interpreted the patient's labs and imaging agree with radiologist interpretation.  CBC shows anemia with a hemoglobin of 7.8 which seems slightly higher than patient's baseline at 7.5 her recent CBCs.  Platelets at 147 which is around her baseline from some of her previous CBCs in the past month.  CMP shows no electrolyte abnormality although there is an elevated BUN and creatinine.  Creatinine at 1.67 which is improved from patient's last creatinine of 1.98 and seems to be consistent on previous CMP's around this level.  Hypocalcemia, low protein, low albumin.  Normal LFTs.  Urinalysis shows proteinuria without any nitrites leukocytes or infection seen.  CT abdomen pelvis shows:   1. Progressive colonic wall thickening from prior CT. Detailed bowel assessment  is limited. Fluid-filled small bowel in a nonobstructive pattern. Multiple enteric sutures.  2. Previous pneumobilia has near completely resolved. High-density material within the biliary tree is also no longer seen.  3. Left femoral venous catheter extends into the IVC terminating at the atrial IVC junction.  4. Avascular necrosis of left femoral head.  Given the patient's known history of Crohn's and progressive colonic wall thickening seen from prior CT on 11/27/21, will consult GI. Spoke with Dr. Watt Climes who recommended steroid taper. I consulted with the patient about this. She does not want to be on steroids as she has not been on them in years and they do not do much fr her per the patient. She declines the prescription. She is requesting another Dilaudid injection and would like to go home.   On reevaluation, Po challenge initiated and the patient passed.   During her stay, the patient received 1L of LR, Zofran, Reglan, and Dilaudid.  I considered admission, but the patient said her pain was under control after the Dilaudid and fluids.  She would like to go home rather than be admitted to the hospital.  She has been tolerating foods and fluids well in the emergency department and reports she is hungry and ready to go home to eat.  The patient is safe for discharge home with strict return precautions and outpatient follow-up.  I discussed the lab and imaging findings with the patient.  The patient still declines the prednisone.  I recommended that she follow-up with her GI doctor with her increase in pain.  She reports to me that she will send a message on Monday to him letting her know about her pain.  Strict return precautions are discussed.  The patient verbalizes understanding and agrees to the plan.  Patient is stable and being discharged home in good condition.  I discussed this case with my attending physician who cosigned this note including patient's presenting symptoms, physical exam, and  planned diagnostics and interventions. Attending physician stated agreement with plan or made changes to plan which were implemented.   Final Clinical Impression(s) / ED Diagnoses Final diagnoses:  Crohn's disease without complication, unspecified gastrointestinal tract location ([place])  Elevated blood pressure reading    Rx / DC Orders ED Discharge Orders     None         Sherrell Puller, Hershal Coria 01/16/22 2257    [name] Ferguson, MD 01/18/22 [phone]

## 2022-01-14 DIAGNOSIS — K5090 Crohn's disease, unspecified, without complications: Secondary | ICD-10-CM | POA: Diagnosis not present

## 2022-01-14 MED ORDER — HYDROMORPHONE HCL 1 MG/ML IJ SOLN
0.5000 mg | Freq: Once | INTRAMUSCULAR | Status: AC
  Administered 2022-01-14: 0.5 mg via INTRAVENOUS
  Filled 2022-01-14: qty 1

## 2022-01-14 NOTE — Discharge Instructions (Addendum)
You were seen here today for your abdominal pain. Your CT scan showed progressive colonic wall thickening from prior CT, but other things are improving. Our GI provider mentioned possibly using steroids for this, but you declined these. Please keep taking your other GI medications as prescribed and contact your GI specialist to let him know of your increased pain. If you have any worsening abdominal pain, nausea or vomiting uncontrolled by your medications, fever, blood in your stool, or dark/tarry stool, please return to the nearest ER for re-evaluation.   Your blood pressure is also elevated while in the ED, although this is likely due to pain, please follow up with your PCP about this.

## 2022-01-15 ENCOUNTER — Other Ambulatory Visit: Payer: Self-pay | Admitting: Internal Medicine

## 2022-01-15 DIAGNOSIS — K912 Postsurgical malabsorption, not elsewhere classified: Secondary | ICD-10-CM

## 2022-01-17 ENCOUNTER — Emergency Department (HOSPITAL_COMMUNITY): Payer: Medicare Other

## 2022-01-17 ENCOUNTER — Encounter (HOSPITAL_COMMUNITY): Payer: Self-pay

## 2022-01-17 ENCOUNTER — Inpatient Hospital Stay (HOSPITAL_COMMUNITY)
Admission: EM | Admit: 2022-01-17 | Discharge: 2022-01-19 | DRG: 386 | Disposition: A | Discharge status: Home / Self Care | Payer: Medicare Other | Attending: Internal Medicine | Admitting: Internal Medicine

## 2022-01-17 ENCOUNTER — Other Ambulatory Visit: Payer: Self-pay

## 2022-01-17 DIAGNOSIS — D61818 Other pancytopenia: Secondary | ICD-10-CM

## 2022-01-17 DIAGNOSIS — R933 Abnormal findings on diagnostic imaging of other parts of digestive tract: Secondary | ICD-10-CM

## 2022-01-17 DIAGNOSIS — G894 Chronic pain syndrome: Secondary | ICD-10-CM | POA: Diagnosis not present

## 2022-01-17 DIAGNOSIS — R1084 Generalized abdominal pain: Secondary | ICD-10-CM

## 2022-01-17 DIAGNOSIS — I10 Essential (primary) hypertension: Secondary | ICD-10-CM | POA: Diagnosis present

## 2022-01-17 DIAGNOSIS — N1832 Chronic kidney disease, stage 3b: Secondary | ICD-10-CM | POA: Diagnosis present

## 2022-01-17 DIAGNOSIS — Z83511 Family history of glaucoma: Secondary | ICD-10-CM

## 2022-01-17 DIAGNOSIS — Z8249 Family history of ischemic heart disease and other diseases of the circulatory system: Secondary | ICD-10-CM

## 2022-01-17 DIAGNOSIS — Z87891 Personal history of nicotine dependence: Secondary | ICD-10-CM

## 2022-01-17 DIAGNOSIS — E46 Unspecified protein-calorie malnutrition: Secondary | ICD-10-CM

## 2022-01-17 DIAGNOSIS — Z820 Family history of epilepsy and other diseases of the nervous system: Secondary | ICD-10-CM

## 2022-01-17 DIAGNOSIS — I129 Hypertensive chronic kidney disease with stage 1 through stage 4 chronic kidney disease, or unspecified chronic kidney disease: Secondary | ICD-10-CM | POA: Diagnosis present

## 2022-01-17 DIAGNOSIS — Z79899 Other long term (current) drug therapy: Secondary | ICD-10-CM

## 2022-01-17 DIAGNOSIS — K90829 Short bowel syndrome, unspecified: Secondary | ICD-10-CM | POA: Diagnosis present

## 2022-01-17 DIAGNOSIS — K219 Gastro-esophageal reflux disease without esophagitis: Secondary | ICD-10-CM | POA: Diagnosis present

## 2022-01-17 DIAGNOSIS — K529 Noninfective gastroenteritis and colitis, unspecified: Secondary | ICD-10-CM | POA: Diagnosis not present

## 2022-01-17 DIAGNOSIS — K5090 Crohn's disease, unspecified, without complications: Secondary | ICD-10-CM

## 2022-01-17 DIAGNOSIS — K8689 Other specified diseases of pancreas: Secondary | ICD-10-CM | POA: Diagnosis present

## 2022-01-17 DIAGNOSIS — Z20822 Contact with and (suspected) exposure to covid-19: Secondary | ICD-10-CM | POA: Diagnosis present

## 2022-01-17 DIAGNOSIS — Z789 Other specified health status: Secondary | ICD-10-CM | POA: Diagnosis not present

## 2022-01-17 DIAGNOSIS — M810 Age-related osteoporosis without current pathological fracture: Secondary | ICD-10-CM | POA: Diagnosis present

## 2022-01-17 DIAGNOSIS — R112 Nausea with vomiting, unspecified: Secondary | ICD-10-CM

## 2022-01-17 DIAGNOSIS — K5010 Crohn's disease of large intestine without complications: Principal | ICD-10-CM | POA: Diagnosis present

## 2022-01-17 DIAGNOSIS — Z833 Family history of diabetes mellitus: Secondary | ICD-10-CM

## 2022-01-17 DIAGNOSIS — K912 Postsurgical malabsorption, not elsewhere classified: Secondary | ICD-10-CM | POA: Diagnosis present

## 2022-01-17 DIAGNOSIS — Z881 Allergy status to other antibiotic agents status: Secondary | ICD-10-CM

## 2022-01-17 DIAGNOSIS — Z832 Family history of diseases of the blood and blood-forming organs and certain disorders involving the immune mechanism: Secondary | ICD-10-CM

## 2022-01-17 DIAGNOSIS — Z885 Allergy status to narcotic agent status: Secondary | ICD-10-CM

## 2022-01-17 DIAGNOSIS — Z803 Family history of malignant neoplasm of breast: Secondary | ICD-10-CM

## 2022-01-17 DIAGNOSIS — Z79891 Long term (current) use of opiate analgesic: Secondary | ICD-10-CM

## 2022-01-17 DIAGNOSIS — G8929 Other chronic pain: Secondary | ICD-10-CM

## 2022-01-17 LAB — LIPASE, BLOOD: Lipase: 39 U/L (ref 11–51)

## 2022-01-17 LAB — RESP PANEL BY RT-PCR (FLU A&B, COVID) ARPGX2
Influenza A by PCR: NEGATIVE
Influenza B by PCR: NEGATIVE
SARS Coronavirus 2 by RT PCR: NEGATIVE

## 2022-01-17 LAB — CBC WITH DIFFERENTIAL/PLATELET
Abs Immature Granulocytes: 0.01 10*3/uL (ref 0.00–0.07)
Basophils Absolute: 0 10*3/uL (ref 0.0–0.1)
Basophils Relative: 0 %
Eosinophils Absolute: 0.1 10*3/uL (ref 0.0–0.5)
Eosinophils Relative: 1 %
HCT: 26.3 % — ABNORMAL LOW (ref 36.0–46.0)
Hemoglobin: 8.7 g/dL — ABNORMAL LOW (ref 12.0–15.0)
Immature Granulocytes: 0 %
Lymphocytes Relative: 41 %
Lymphs Abs: 1.5 10*3/uL (ref 0.7–4.0)
MCH: 30.4 pg (ref 26.0–34.0)
MCHC: 33.1 g/dL (ref 30.0–36.0)
MCV: 92 fL (ref 80.0–100.0)
Monocytes Absolute: 0.1 10*3/uL (ref 0.1–1.0)
Monocytes Relative: 4 %
Neutro Abs: 1.9 10*3/uL (ref 1.7–7.7)
Neutrophils Relative %: 54 %
Platelets: 148 10*3/uL — ABNORMAL LOW (ref 150–400)
RBC: 2.86 MIL/uL — ABNORMAL LOW (ref 3.87–5.11)
RDW: 14.8 % (ref 11.5–15.5)
WBC: 3.6 10*3/uL — ABNORMAL LOW (ref 4.0–10.5)
nRBC: 0 % (ref 0.0–0.2)

## 2022-01-17 LAB — COMPREHENSIVE METABOLIC PANEL
ALT: 17 U/L (ref 0–44)
AST: 14 U/L — ABNORMAL LOW (ref 15–41)
Albumin: 2.7 g/dL — ABNORMAL LOW (ref 3.5–5.0)
Alkaline Phosphatase: 76 U/L (ref 38–126)
Anion gap: 4 — ABNORMAL LOW (ref 5–15)
BUN: 33 mg/dL — ABNORMAL HIGH (ref 6–20)
CO2: 24 mmol/L (ref 22–32)
Calcium: 7.7 mg/dL — ABNORMAL LOW (ref 8.9–10.3)
Chloride: 109 mmol/L (ref 98–111)
Creatinine, Ser: 1.4 mg/dL — ABNORMAL HIGH (ref 0.44–1.00)
GFR, Estimated: 43 mL/min — ABNORMAL LOW (ref >60)
Glucose, Bld: 105 mg/dL — ABNORMAL HIGH (ref 70–99)
Potassium: 4.2 mmol/L (ref 3.5–5.1)
Sodium: 137 mmol/L (ref 135–145)
Total Bilirubin: 0.4 mg/dL (ref 0.3–1.2)
Total Protein: 6.1 g/dL — ABNORMAL LOW (ref 6.5–8.1)

## 2022-01-17 MED ORDER — HYDROMORPHONE HCL 1 MG/ML IJ SOLN
1.0000 mg | INTRAMUSCULAR | Status: DC | PRN
  Filled 2022-01-17: qty 1

## 2022-01-17 MED ORDER — SODIUM CHLORIDE 0.9% FLUSH
10.0000 mL | Freq: Two times a day (BID) | INTRAVENOUS | Status: DC
  Administered 2022-01-18 – 2022-01-19 (×3): 10 mL

## 2022-01-17 MED ORDER — HYDROMORPHONE HCL 1 MG/ML IJ SOLN
0.5000 mg | Freq: Once | INTRAMUSCULAR | Status: DC
  Administered 2022-01-17: 0.5 mg via INTRAVENOUS

## 2022-01-17 MED ORDER — LACTATED RINGERS IV SOLN: INTRAVENOUS | Status: DC

## 2022-01-17 MED ORDER — METHYLPREDNISOLONE SODIUM SUCC 125 MG IJ SOLR
125.0000 mg | INTRAMUSCULAR | Status: AC
  Administered 2022-01-17: 125 mg via INTRAVENOUS
  Filled 2022-01-17: qty 2

## 2022-01-17 MED ORDER — DIPHENHYDRAMINE HCL 50 MG/ML IJ SOLN
25.0000 mg | Freq: Once | INTRAMUSCULAR | Status: AC
  Administered 2022-01-17: 25 mg via INTRAVENOUS
  Filled 2022-01-17: qty 1

## 2022-01-17 MED ORDER — HYDROMORPHONE HCL 1 MG/ML IJ SOLN: 1.0000 mg | INTRAMUSCULAR | Status: DC | PRN

## 2022-01-17 MED ORDER — HYDROMORPHONE HCL 1 MG/ML IJ SOLN
1.0000 mg | Freq: Once | INTRAMUSCULAR | Status: AC
  Administered 2022-01-17: 1 mg via INTRAVENOUS
  Filled 2022-01-17: qty 1

## 2022-01-17 MED ORDER — ONDANSETRON HCL 4 MG/2ML IJ SOLN
4.0000 mg | Freq: Once | INTRAMUSCULAR | Status: AC
  Administered 2022-01-17: 4 mg via INTRAVENOUS
  Filled 2022-01-17: qty 2

## 2022-01-17 MED ORDER — ACETAMINOPHEN 325 MG PO TABS: 650.0000 mg | ORAL_TABLET | Freq: Four times a day (QID) | ORAL | Status: DC | PRN

## 2022-01-17 MED ORDER — ONDANSETRON HCL 4 MG PO TABS: 4.0000 mg | ORAL_TABLET | Freq: Four times a day (QID) | ORAL | Status: DC | PRN

## 2022-01-17 MED ORDER — ONDANSETRON HCL 4 MG/2ML IJ SOLN
4.0000 mg | Freq: Four times a day (QID) | INTRAMUSCULAR | Status: DC
Start: 2022-01-18 — End: 2022-01-19
  Administered 2022-01-17 – 2022-01-19 (×6): 4 mg via INTRAVENOUS
  Filled 2022-01-17 (×6): qty 2

## 2022-01-17 MED ORDER — SODIUM CHLORIDE 0.9% FLUSH: 10.0000 mL | INTRAVENOUS | Status: DC | PRN

## 2022-01-17 MED ORDER — PANTOPRAZOLE SODIUM 40 MG IV SOLR
40.0000 mg | INTRAVENOUS | Status: DC
Start: 2022-01-17 — End: 2022-01-19
  Administered 2022-01-17 – 2022-01-18 (×2): 40 mg via INTRAVENOUS
  Filled 2022-01-17 (×2): qty 10

## 2022-01-17 MED ORDER — HYDROMORPHONE HCL 1 MG/ML IJ SOLN
1.0000 mg | INTRAMUSCULAR | Status: DC | PRN
  Administered 2022-01-17 – 2022-01-19 (×19): 1 mg via INTRAVENOUS
  Filled 2022-01-17 (×19): qty 1

## 2022-01-17 MED ORDER — ONDANSETRON HCL 4 MG/2ML IJ SOLN
4.0000 mg | Freq: Four times a day (QID) | INTRAMUSCULAR | Status: DC | PRN
  Administered 2022-01-17: 4 mg via INTRAVENOUS
  Filled 2022-01-17: qty 2

## 2022-01-17 MED ORDER — CHLORHEXIDINE GLUCONATE CLOTH 2 % EX PADS
6.0000 | MEDICATED_PAD | Freq: Every day | CUTANEOUS | Status: DC
  Administered 2022-01-17 – 2022-01-19 (×3): 6 via TOPICAL

## 2022-01-17 MED ORDER — PROCHLORPERAZINE EDISYLATE 10 MG/2ML IJ SOLN
5.0000 mg | INTRAMUSCULAR | Status: DC | PRN
Start: 2022-01-17 — End: 2022-01-19
  Administered 2022-01-17 – 2022-01-19 (×6): 5 mg via INTRAVENOUS
  Filled 2022-01-17 (×6): qty 2

## 2022-01-17 MED ORDER — LACTATED RINGERS IV BOLUS
1000.0000 mL | Freq: Once | INTRAVENOUS | Status: AC
  Administered 2022-01-17: 1000 mL via INTRAVENOUS

## 2022-01-17 MED ORDER — ONDANSETRON HCL 4 MG PO TABS: 4.0000 mg | ORAL_TABLET | Freq: Four times a day (QID) | ORAL | Status: DC

## 2022-01-17 MED ORDER — ACETAMINOPHEN 650 MG RE SUPP: 650.0000 mg | Freq: Four times a day (QID) | RECTAL | Status: DC | PRN

## 2022-01-17 NOTE — Consult Note (Addendum)
Consultation  Referring Provider: No ref. provider found Primary Care Physician:  [name], [name], MD Primary Gastroenterologist:  [facility] /Dr Domenica Fail  Reason for Consultation: Abdominal pain ,nausea /vomiting diarrhea, and concern with abnormal CT.  Long history of Crohn's disease inactive  HPI: Caitlin Peters is a 61 y.o. female, who was seen by our GI service during her last admission in January 2023.  She is cared for by [facility]/Dr. Domenica Fail, and has very complicated GI history with longstanding history of Crohn's disease diagnosed in her early 17s, she has had multiple small bowel resections and subsequent development of short gut syndrome.  She is felt to have about 165 cm of small bowel remaining and is currently being managed with Gattex, and requires chronic TPN nocturnally.  She has had issues with recurrent line infections. She has had chronic abdominal pain for many years and is followed by [facility] pain management, currently on a combination of methadone and oral Dilaudid. During her last admission in January there was question of distal colonic wall thickening and mild inflammatory changes on CT, no obstructive changes.  C. difficile was negative.  She underwent colonoscopy per Dr. Havery Moros which was negative, including examined portions of the neoterminal ileum. She was treated for sepsis during that admission, secondary to PICC line and currently has a PICC line in her left thigh.  Course also complicated by acute kidney injury which did resolve. She did have follow-up with [facility] on 01/09/2022 and no changes made to her regimen. She was then seen in the ER here on 01/13/2022 with complaints of body aches and vomiting as well as intermittent lower abdominal pain.  CT was done with oral contrast only, there was question of some progressive colonic wall thickening from prior CT but detailed bowel assessment limited.  Labs were reassuring and she was discharged home-a steroid taper  was recommended by GI on-call/[place],, patient declined.. She returns today with recurrent epigastric/upper abdominal pain nausea and vomiting onset yesterday.  She has not been eating well over the past couple of days though trying to keep down some p.o.'s.  She came in today after vomiting a large amount of fluid, and says she also vomited her medications.  She says with the Gattex she generally has 2-3 bowel movements per day but over the past couple of days has been having up to 10 diarrheal stools per day, no documented fever or chills.  Repeat CT imaging without any contrast today-suboptimal visualization however concerning for significant thickening of the colonic wall involving the descending colon to the rectum, thickening of the stomach wall, and some stranding in the abdominal fat, no obstruction.  WBC 3.6/hemoglobin 8.7/hematocrit 26.3 about her baseline BUN 33/creatinine 1.4, calcium 7.7/albumin 2.7 Respiratory panel negative.   Patient says she knows that her Crohn's has not been active and has not believe that any of these recent episodes have been related to active Crohn's disease however she gets depleted very easily at home when she is unable to keep down p.o.'s, and also feels that her meds are not getting absorbed at times. She is on chronic nocturnal TPN which is also managed by [facility].  She has lost about 20 pounds over the past year and a half gradually.    Past Medical History:  Diagnosis Date   Acute pancreatitis 04/13/2020   Anasarca 10/2019   AVN (avascular necrosis of bone) ([name])    Cataract    Choledocholithiasis (sludge) s/p ERCP 10/2019 10/21/2020  Chronic pain syndrome    CKD (chronic kidney disease), stage III ([name])    Depression    Diverticulosis    GERD (gastroesophageal reflux disease)    HTN (hypertension)    IDA (iron deficiency anemia)    Malnutrition ([place])    Mass in chest    Osteoporosis 12/24/2014   Pancreatitis    SGS (short gut syndrome) from  intestinal resections for Crohns Disease 07/15/2014    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; 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; jejunal to transverse colon anastomosis Treated at [place] SB lengthening to 165cm Dr Alene Mires, [place] [facility]   Vitamin B12 deficiency     Past Surgical History:  Procedure Laterality Date   ABDOMINAL ADHESION SURGERY  01/22/2018   [place]  11/26/2019   Procedure: BILIARY DILATION;  Surgeon: Jackquline Denmark, MD;  Location: [facility];  Service: Endoscopy;;   BILIARY DILATION  03/08/2020   Procedure: BILIARY DILATION;  Surgeon: Irving Copas., MD;  Location: [place];  Service: Gastroenterology;;   BIOPSY  03/08/2020   Procedure: BIOPSY;  Surgeon: Irving Copas., MD;  Location: [place];  Service: Gastroenterology;;   CHEST WALL RESECTION     right thoracotomy,resection of chest mass with anterior rib and reconstruction using prosthetic mesh and video arthroscopy   CHOLECYSTECTOMY  01/22/2018   COLONOSCOPY  2019   COLONOSCOPY WITH PROPOFOL N/A 11/29/2021   Procedure: COLONOSCOPY WITH PROPOFOL;  Surgeon: Yetta Flock, MD;  Location: [facility];  Service: Gastroenterology;  Laterality: N/A;   ENTEROSTOMY CLOSURE  04/1999   ERCP N/A 11/26/2019   Procedure: ENDOSCOPIC RETROGRADE CHOLANGIOPANCREATOGRAPHY (ERCP);  Surgeon: Jackquline Denmark, MD;  Location: [facility];  Service: Endoscopy;  Laterality: N/A;   ERCP N/A 03/08/2020   Procedure: ENDOSCOPIC RETROGRADE CHOLANGIOPANCREATOGRAPHY (ERCP);  Surgeon: Irving Copas., MD;  Location: [place];  Service: Gastroenterology;  Laterality: N/A;   ESOPHAGOGASTRODUODENOSCOPY N/A 03/08/2020   Procedure: ESOPHAGOGASTRODUODENOSCOPY (EGD);  Surgeon: Irving Copas., MD;  Location: [place];  Service: Gastroenterology;  Laterality: N/A;   EUS N/A 03/08/2020   Procedure: UPPER ENDOSCOPIC ULTRASOUND (EUS) LINEAR;  Surgeon:  Irving Copas., MD;  Location: [place];  Service: Gastroenterology;  Laterality: N/A;   ILEOCECETOMY  03/1999   ileocolon resection with abdominal stoma   ILEOSTOMY CLOSURE  2001   IR FLUORO GUIDE CV LINE LEFT  01/07/2020   IR FLUORO GUIDE CV LINE LEFT  03/09/2020   IR FLUORO GUIDE CV LINE LEFT  05/09/2020   IR FLUORO GUIDE CV LINE LEFT  07/20/2020   IR FLUORO GUIDE CV LINE RIGHT  08/05/2020   IR FLUORO GUIDE CV LINE RIGHT  04/03/2021   IR PERC TUN PERIT CATH WO PORT S&I /IMAG  12/05/2021   IR PTA VENOUS EXCEPT DIALYSIS CIRCUIT  01/07/2020   IR REMOVAL TUN CV CATH W/O FL  08/05/2020   IR REMOVAL TUN CV CATH W/O FL  07/11/2021   IR REMOVAL TUN CV CATH W/O FL  11/29/2021   IR US GUIDE VASC ACCESS LEFT     x 2 06/17/19 and 09/14/2019   IR US GUIDE VASC ACCESS LEFT  12/05/2021   IR US GUIDE VASC ACCESS RIGHT  08/05/2020   KNEE SURGERY     right knee    LAPAROSCOPIC SMALL BOWEL RESECTION  2009   2000-2009.  SB resections for Crohns Disease - now with Short gut   OMENTECTOMY  01/22/2018   PARTIAL  HYSTERECTOMY  1984   with LSO   REMOVAL OF STONES  11/26/2019   Procedure: REMOVAL OF STONES;  Surgeon: Jackquline Denmark, MD;  Location: [facility];  Service: Endoscopy;;   REMOVAL OF STONES  03/08/2020   Procedure: REMOVAL OF STONES;  Surgeon: Irving Copas., MD;  Location: [facility] END[facility];  Service: Gastroenterology;;   SALPINGOOPHORECTOMY Left 1984   SALPINGOOPHORECTOMY Right 1990   SERIAL TRANSVERSE ENTEROPLASTY (STEP) - SMALL BOWEL LENGTHENING  01/22/2018   Dr Alene Mires, [facility] - SB length from 120 to 165cm    SMALL INTESTINE SURGERY  2002   SMALL INTESTINE SURGERY  2003   SPHINCTEROTOMY  11/26/2019   Procedure: SPHINCTEROTOMY;  Surgeon: Jackquline Denmark, MD;  Location: [facility];  Service: Endoscopy;;   TOTAL ABDOMINAL HYSTERECTOMY  1990   with RSO   UPPER GASTROINTESTINAL ENDOSCOPY      Prior to Admission medications   Medication Sig Start Date End Date Taking?  Authorizing Provider  acetaminophen (TYLENOL) 650 MG CR tablet Take 1,300 mg by mouth every 6 (six) hours.   Yes [provider]  amLODipine (NORVASC) 10 MG tablet Take 1 tablet by mouth once daily Patient taking differently: Take 10 mg by mouth daily. 12/29/21  Yes [name], [name], MD  buPROPion ER [facility] SR) 100 MG 12 hr tablet Take 2 tablets by mouth once daily Patient taking differently: 200 mg daily. 08/30/21  Yes [name], [name], MD  Calcium 500 MG tablet Take 500 mg by mouth daily.   Yes [provider]  carvedilol (COREG) 25 MG tablet Take 1 tablet (25 mg total) by mouth 2 (two) times daily. 12/26/21  Yes Chandrasekhar, Mahesh A, MD  cholecalciferol (VITAMIN D3) 25 MCG (1000 UT) tablet Take 1,000 Units by mouth daily.    Yes [provider]  cycloSPORINE (RESTASIS) 0.05 % ophthalmic emulsion Place 1 drop into both eyes 2 (two) times daily.   Yes [provider]  DEXILANT 60 MG capsule Take 1 capsule by mouth once daily Patient taking differently: Take 60 mg by mouth daily. 11/21/21  Yes [name], [name], MD  diphenoxylate-atropine (LOMOTIL) 2.5-0.025 MG tablet Take 1 tablet by mouth 4 (four) times daily. 09/04/21  Yes [provider]  DULoxetine (CYMBALTA) 30 MG capsule Take 90 mg by mouth daily.   Yes [provider]  estradiol (ESTRACE) 2 MG tablet Take 1 tablet by mouth once daily Patient taking differently: Take 2 mg by mouth daily. 10/03/21  Yes [name], [name], MD  famotidine (PEPCID) 20 MG tablet Take 20 mg by mouth at bedtime.   Yes [provider]  furosemide (LASIX) 20 MG tablet Take 20 mg by mouth as needed. swelling 12/22/21  Yes [provider]  GATTEX 5 MG KIT Inject 1.5 mg into the skin daily. 01/03/21  Yes [provider]  hydrALAZINE (APRESOLINE) 100 MG tablet Take 1 tablet (100 mg total) by mouth 3 (three) times daily. 11/07/21  Yes [name],  [name], MD  HYDROmorphone (DILAUDID) 4 MG tablet Take 4 mg by mouth 4 (four) times daily. 04/18/21  Yes [provider]  levETIRAcetam (KEPPRA) 500 MG tablet Take 1 tablet (500 mg total) by mouth 2 (two) times daily. 08/28/21  Yes Cameron Sprang, MD  lipase/protease/amylase (CREON) 36000 UNITS CPEP capsule Take 1 capsule (36,000 Units total) by mouth See admin instructions. 36000 units with meals Patient taking differently: Take 36,000 Units by mouth 3 (three) times daily with  meals. 36000 units with meals 06/30/21  Yes Eugenie Filler, MD  loperamide (IMODIUM) 2 MG capsule Take 4 mg by mouth 2 (two) times daily as needed for diarrhea or loose stools. 09/26/21  Yes [provider]  methadone (DOLOPHINE) 5 MG tablet Take 1 tablet (5 mg total) by mouth 5 (five) times daily. 09/29/21  Yes Little Ishikawa, MD  methocarbamol (ROBAXIN) 500 MG tablet Take 1 tablet (500 mg total) by mouth in the morning and at bedtime. 12/20/21  Yes [name], [name], MD  metoprolol tartrate (LOPRESSOR) 100 MG tablet Take 100 mg by mouth 2 (two) times daily.   Yes [provider]  Multiple Vitamins-Minerals (MULTIVITAMIN ADULT PO) Take 1 tablet by mouth daily.   Yes [provider]  NARCAN 4 MG/0.1ML LIQD nasal spray kit Place 1 spray into the nose once as needed (overdose). 10/14/20  Yes [provider]  Omega-3 1400 MG CAPS Take 1,400 mg by mouth daily.   Yes [provider]  ondansetron (ZOFRAN) 4 MG tablet Take 4 mg by mouth every 8 (eight) hours as needed for nausea or vomiting. 11/18/21  Yes [provider]  polyvinyl alcohol (LIQUIFILM TEARS) 1.4 % ophthalmic solution Place 1 drop into both eyes daily as needed for dry eyes.   Yes [provider]  promethazine (PHENERGAN) 25 MG tablet TAKE 1 TABLET BY MOUTH EVERY 6 HOURS AS NEEDED FOR NAUSEA FOR VOMITING Patient taking differently: Take 25 mg by mouth every 6 (six) hours as needed for  nausea or vomiting. 01/15/22  Yes [name], [name], MD  sodium chloride 0.9 % infusion Inject 1,000 mLs into the vein daily. For chronic dehydration 10/16/20  Yes [provider]  Trace Minerals Cu-Mn-Se-Zn (TRALEMENT IV) Inject 1 mL into the vein See admin instructions. Used in TPN bag 4 times weekly   Yes [provider]  traZODone (DESYREL) 100 MG tablet Take 200 mg by mouth at bedtime.   Yes [provider]  vitamin B-12 (CYANOCOBALAMIN) 1000 MCG tablet Take 1,000 mcg by mouth daily.    Yes [provider]  PRESCRIPTION MEDICATION Inject 1 each into the vein daily. Home TPN . [facility] [place] . 1 bag for 12 hours. [phone]    [provider]    Current Facility-Administered Medications  Medication Dose Route Frequency Provider Last Rate Last Admin   acetaminophen (TYLENOL) tablet 650 mg  650 mg Oral Q6H PRN Reubin Milan, MD       Or   acetaminophen (TYLENOL) suppository 650 mg  650 mg Rectal Q6H PRN Reubin Milan, MD       HYDROmorphone (DILAUDID) injection 1 mg  1 mg Intravenous Q4H PRN Reubin Milan, MD       lactated ringers infusion   Intravenous Continuous Reubin Milan, MD 75 mL/hr at 01/17/22 1442 New Bag at 01/17/22 1442   ondansetron (ZOFRAN) tablet 4 mg  4 mg Oral Q6H PRN Reubin Milan, MD       Or   ondansetron [facility]) injection 4 mg  4 mg Intravenous Q6H PRN Reubin Milan, MD       pantoprazole (PROTONIX) injection 40 mg  40 mg Intravenous Q24H Reubin Milan, MD       Current Outpatient Medications  Medication Sig Dispense Refill   acetaminophen (TYLENOL) 650 MG CR tablet Take 1,300 mg by mouth every 6 (six) hours.     amLODipine (NORVASC)  10 MG tablet Take 1 tablet by mouth once daily (Patient taking differently: Take 10 mg by mouth daily.) 90 tablet 0   buPROPion ER (WELLBUTRIN SR) 100 MG 12 hr tablet Take 2 tablets by mouth once daily (Patient taking  differently: 200 mg daily.) 60 tablet 2   Calcium 500 MG tablet Take 500 mg by mouth daily.     carvedilol (COREG) 25 MG tablet Take 1 tablet (25 mg total) by mouth 2 (two) times daily. 180 tablet 3   cholecalciferol (VITAMIN D3) 25 MCG (1000 UT) tablet Take 1,000 Units by mouth daily.      cycloSPORINE (RESTASIS) 0.05 % ophthalmic emulsion Place 1 drop into both eyes 2 (two) times daily.     DEXILANT 60 MG capsule Take 1 capsule by mouth once daily (Patient taking differently: Take 60 mg by mouth daily.) 90 capsule 0   diphenoxylate-atropine (LOMOTIL) 2.5-0.025 MG tablet Take 1 tablet by mouth 4 (four) times daily.     DULoxetine (CYMBALTA) 30 MG capsule Take 90 mg by mouth daily.     estradiol (ESTRACE) 2 MG tablet Take 1 tablet by mouth once daily (Patient taking differently: Take 2 mg by mouth daily.) 90 tablet 0   famotidine (PEPCID) 20 MG tablet Take 20 mg by mouth at bedtime.     furosemide (LASIX) 20 MG tablet Take 20 mg by mouth as needed. swelling     GATTEX 5 MG KIT Inject 1.5 mg into the skin daily.     hydrALAZINE (APRESOLINE) 100 MG tablet Take 1 tablet (100 mg total) by mouth 3 (three) times daily. 90 tablet 1   HYDROmorphone (DILAUDID) 4 MG tablet Take 4 mg by mouth 4 (four) times daily.     levETIRAcetam (KEPPRA) 500 MG tablet Take 1 tablet (500 mg total) by mouth 2 (two) times daily. 60 tablet 11   lipase/protease/amylase (CREON) 36000 UNITS CPEP capsule Take 1 capsule (36,000 Units total) by mouth See admin instructions. 36000 units with meals (Patient taking differently: Take 36,000 Units by mouth 3 (three) times daily with meals. 36000 units with meals)     loperamide (IMODIUM) 2 MG capsule Take 4 mg by mouth 2 (two) times daily as needed for diarrhea or loose stools.     methadone (DOLOPHINE) 5 MG tablet Take 1 tablet (5 mg total) by mouth 5 (five) times daily. 30 tablet 0   methocarbamol (ROBAXIN) 500 MG tablet Take 1 tablet (500 mg total) by mouth in the morning and at  bedtime. 60 tablet 1   metoprolol tartrate (LOPRESSOR) 100 MG tablet Take 100 mg by mouth 2 (two) times daily.     Multiple Vitamins-Minerals (MULTIVITAMIN ADULT PO) Take 1 tablet by mouth daily.     NARCAN 4 MG/0.1ML LIQD nasal spray kit Place 1 spray into the nose once as needed (overdose).     Omega-3 1400 MG CAPS Take 1,400 mg by mouth daily.     ondansetron (ZOFRAN) 4 MG tablet Take 4 mg by mouth every 8 (eight) hours as needed for nausea or vomiting.     polyvinyl alcohol (LIQUIFILM TEARS) 1.4 % ophthalmic solution Place 1 drop into both eyes daily as needed for dry eyes.     promethazine (PHENERGAN) 25 MG tablet TAKE 1 TABLET BY MOUTH EVERY 6 HOURS AS NEEDED FOR NAUSEA FOR VOMITING (Patient taking differently: Take 25 mg by mouth every 6 (six) hours as needed for nausea or vomiting.) 90 tablet 0   sodium chloride 0.9 % infusion  Inject 1,000 mLs into the vein daily. For chronic dehydration     Trace Minerals Cu-Mn-Se-Zn (TRALEMENT IV) Inject 1 mL into the vein See admin instructions. Used in TPN bag 4 times weekly     traZODone (DESYREL) 100 MG tablet Take 200 mg by mouth at bedtime.     vitamin B-12 (CYANOCOBALAMIN) 1000 MCG tablet Take 1,000 mcg by mouth daily.      PRESCRIPTION MEDICATION Inject 1 each into the vein daily. Home TPN . [facility] in [facility] . 1 bag for 12 hours. [phone]      Allergies as of 01/17/2022 - Review Complete 01/17/2022  Allergen Reaction Noted   Meperidine Hives 08/14/2011   Hyoscyamine Hives and Swelling 07/15/2014   Cefepime Other (See Comments) 11/27/2017   Gabapentin Other (See Comments) 10/13/2019   Lyrica [pregabalin] Other (See Comments) 10/13/2019   Topamax [topiramate] Other (See Comments) 10/13/2019   Zosyn [piperacillin sod-tazobactam so] Other (See Comments) 08/03/2020   Fentanyl Rash 10/12/2019   Morphine and related Rash 10/12/2019    Family History  Problem Relation Age of Onset   Seizures Mother    Glaucoma  Mother    CAD Father    Heart disease Father    Hypertension Father    Breast cancer Sister    Multiple sclerosis Sister    Diabetes Sister    Lupus Sister    Colon cancer Other    Crohn's disease Other     Social History   Socioeconomic History   Marital status: Single    Spouse name: Not on file   Number of children: Not on file   Years of education: Not on file   Highest education level: Not on file  Occupational History   Occupation: disabled  Tobacco Use   Smoking status: Former    Packs/day: 0.50    Years: 35.00    Pack years: 17.50    Types: Cigarettes   Smokeless tobacco: Never  Vaping Use   Vaping Use: Never used  Substance and Sexual Activity   Alcohol use: Not Currently   Drug use: Never   Sexual activity: Yes  Other Topics Concern   Not on file  Social History Narrative   Right handed   One story apartment   No caffeine   Social Determinants of Health   Financial Resource Strain: Not on file  Food Insecurity: Not on file  Transportation Needs: Not on file  Physical Activity: Not on file  Stress: Not on file  Social Connections: Not on file  Intimate Partner Violence: Not on file    Review of Systems: Pertinent positive and negative review of systems were noted in the above HPI section.  All other review of systems was otherwise negative.   Physical Exam: Vital signs in last 24 hours: Temp:  [98 F (36.7 C)] 98 F (36.7 C) (02/22 1131) Pulse Rate:  [63-73] 63 (02/22 1430) Resp:  [16-20] 16 (02/22 1300) BP: (131-145)/(60-71) 145/71 (02/22 1430) SpO2:  [98 %-100 %] 99 % (02/22 1430) Weight:  [59.4 kg] 59.4 kg (02/22 1132)   General:   Alert,  Well-developed, chronically ill-appearing, frail appearing older African-American female pleasant and cooperative in NAD Head:  Normocephalic and atraumatic. Eyes:  Sclera clear, no icterus.   Conjunctiva pink. Ears:  Normal auditory acuity. Nose:  No deformity, discharge,  or lesions. Mouth:  No  deformity or lesions.   Neck:  Supple; no masses or thyromegaly. Lungs:  Clear throughout to auscultation.  No wheezes, crackles, or rhonchi.  Heart:  Regular rate and rhythm; no murmurs, clicks, rubs,  or gallops. Abdomen:  Soft, nondistended, bowel sounds are present, she has some diffuse generalized tenderness, no rebound, positive abdominal bruit Rectal: Not done Msk:  Symmetrical without gross deformities. . Pulses:  Normal pulses noted. Extremities:  Without clubbing or edema.-PICC line present in the left thigh Neurologic:  Alert and  oriented x4;  grossly normal neurologically. Skin:  Intact without significant lesions or rashes.. Psych:  Alert and cooperative. Normal mood and affect.  Intake/Output from previous day: No intake/output data recorded. Intake/Output this shift: No intake/output data recorded.  Lab Results: Recent Labs    01/17/22 1157  WBC 3.6*  HGB 8.7*  HCT 26.3*  PLT 148*   BMET Recent Labs    01/17/22 1157  NA 137  K 4.2  CL 109  CO2 24  GLUCOSE 105*  BUN 33*  CREATININE 1.40*  CALCIUM 7.7*   LFT Recent Labs    01/17/22 1157  PROT 6.1*  ALBUMIN 2.7*  AST 14*  ALT 17  ALKPHOS 76  BILITOT 0.4   PT/INR No results for input(s): LABPROT, INR in the last 72 hours. Hepatitis Panel No results for input(s): HEPBSAG, HCVAB, HEPAIGM, HEPBIGM in the last 72 hours.    IMPRESSION:  #60 61 year old African-American female with long history of complicated Crohn's disease initially diagnosed in her 42s resulting in multiple small bowel resections, colon resection and eventual diagnosis of short gut syndrome. Patient has not had any evidence of active Crohn's disease in the past few years, and had very recent colonoscopy during her last admission with no evidence of active Crohn's in the colon or terminal ileum.  She presents today with nausea vomiting and upper abdominal pain, unable to keep down p.o.'s including her meds.  She had a similar ER  visit a couple of days ago and was allowed discharged home  CT without any contrast today read as concerning for colonic wall thickening and gastric wall thickening as well as some mesenteric stranding.  Changes are not secondary to active Crohn's, she may have some bowel wall edema on imaging more likely secondary to hypoalbuminemia.  She has had increase in stools over the past several days up to 10 times per day over her baseline.  Will need to rule out C. Difficile  With chronic methadone use and Dilaudid use, patient has very little reserve for becoming acutely ill and/or having withdrawal symptoms with pain and vomiting if she is unable to keep her medications down and/or if she has variable absorption.  #2 chronic malnutrition/on chronic nocturnal TPN, persistent hypoalbuminemia #3 Short gut syndrome-on Gattex #4 chronic pain syndrome as outlined above managed with oral Dilaudid and methadone #5 chronic anemia #6 recurrent issues with bacteremia secondary to line sepsis-managed for sepsis during last admission January 2023 #7 history of pancreatic insufficiency on Creon  PLAN: Patient does not need to be on steroids, no evidence for active Crohn's Start full liquid diet We will benefit from short admission with IV fluids, and IV antiemetics and administration of her chronic narcotics IV until she can tolerate p.o.'s. Stool for C. difficile Ultimately she may need adjustment of her chronic pain regimen per her pain specialist as an outpatient. Please continue Gattex Continue nighttime TPN GI will follow with you She will return to the care of Dr. Solmon Ice GI on discharge   Antwyne Pingree [place] PA-C 01/17/2022, 3:15 PM

## 2022-01-17 NOTE — Plan of Care (Signed)
°  Problem: Clinical Measurements: Goal: Ability to maintain clinical measurements within normal limits will improve Outcome: Progressing   Problem: Nutrition: Goal: Adequate nutrition will be maintained Outcome: Progressing

## 2022-01-17 NOTE — ED Provider Notes (Signed)
[facility] DEPT Provider Note   CSN: [id number] Arrival date & time: 01/17/22  1122     History  Chief Complaint  Patient presents with   Nausea   Emesis    Caitlin Peters is a 61 y.o. female.  61 year old female presents with recurrent emesis as well as diffuse abdominal pain.  History of Crohn's disease and seen here 2 days ago for similar symptoms.  Concerned about possible obstruction had a CT scan at that time which did not show any acute process.  Patient felt to have a Crohn's exacerbation and was offered steroids after the provider spoke with the patient's GI doctor.  Patient declined the steroids.  This morning she started to develop bilious characterized as brown.  Denies any black stools.  Does not take any blood thinners.  Patient has been infusing hydration solution to her PICC line as well as TPN.  Called EMS and was transported here      Home Medications Prior to Admission medications   Medication Sig Start Date End Date Taking? Authorizing Provider  acetaminophen (TYLENOL) 650 MG CR tablet Take 1,300 mg by mouth every 6 (six) hours.    [provider]  amLODipine (NORVASC) 10 MG tablet Take 1 tablet by mouth once daily 12/29/21   [name], [name], MD  buPROPion [facility] SR) 100 MG 12 hr tablet Take 2 tablets by mouth once daily 08/30/21   [name], [name], MD  Calcium 500 MG tablet Take 500 mg by mouth daily.    [provider]  carvedilol (COREG) 25 MG tablet Take 1 tablet (25 mg total) by mouth 2 (two) times daily. 12/26/21   Werner Lean, MD  cholecalciferol (VITAMIN D3) 25 MCG (1000 UT) tablet Take 1,000 Units by mouth daily.     [provider]  cycloSPORINE (RESTASIS) 0.05 % ophthalmic emulsion Place 1 drop into both eyes 2 (two) times daily.    [provider]  DEXILANT 60 MG capsule Take 1 capsule by mouth once daily 11/21/21   [name], [name], MD   diphenoxylate-atropine (LOMOTIL) 2.5-0.025 MG tablet Take 1 tablet by mouth 2 (two) times daily as needed for diarrhea or loose stools. 09/04/21   [provider]  DULoxetine (CYMBALTA) 60 MG capsule Take 2 capsules (120 mg total) by mouth daily. 09/15/21   [name], [name], MD  estradiol (ESTRACE) 2 MG tablet Take 1 tablet by mouth once daily 10/03/21   [name], [name], MD  famotidine (PEPCID) 20 MG tablet Take 20 mg by mouth daily as needed for heartburn or indigestion.    [provider]  furosemide (LASIX) 20 MG tablet Take 20 mg by mouth as needed. swelling 12/22/21   [provider]  GATTEX 5 MG KIT Inject 1.5 mg into the skin daily. 01/03/21   [provider]  hydrALAZINE (APRESOLINE) 100 MG tablet Take 1 tablet (100 mg total) by mouth 3 (three) times daily. 11/07/21   [name], [name], MD  HYDROmorphone (DILAUDID) 4 MG tablet Take 4 mg by mouth 4 (four) times daily. 04/18/21   [provider]  levETIRAcetam (KEPPRA) 500 MG tablet Take 1 tablet (500 mg total) by mouth 2 (two) times daily. 08/28/21   Cameron Sprang, MD  lipase/protease/amylase (CREON) 36000 UNITS CPEP capsule Take 1 capsule (36,000 Units total) by mouth See admin instructions. 36000 units with meals 06/30/21   Eugenie Filler, MD  loperamide (IMODIUM)  2 MG capsule Take 4 mg by mouth 2 (two) times daily as needed for diarrhea or loose stools. 09/26/21   [provider]  methadone (DOLOPHINE) 5 MG tablet Take 1 tablet (5 mg total) by mouth 5 (five) times daily. 09/29/21   Little Ishikawa, MD  methocarbamol (ROBAXIN) 500 MG tablet Take 1 tablet (500 mg total) by mouth in the morning and at bedtime. 12/20/21   [name], [name], MD  Multiple Vitamins-Minerals (MULTIVITAMIN ADULT PO) Take 1 tablet by mouth daily.    [provider]  NARCAN 4 MG/0.1ML LIQD nasal spray kit Place 1 spray into the nose once as needed (overdose). 10/14/20    [provider]  ondansetron (ZOFRAN) 4 MG tablet Take 4 mg by mouth every 8 (eight) hours as needed for nausea or vomiting. 11/18/21   [provider]  polyvinyl alcohol (LIQUIFILM TEARS) 1.4 % ophthalmic solution Place 1 drop into both eyes daily as needed for dry eyes.    [provider]  PRESCRIPTION MEDICATION Inject 1 each into the vein daily. Home TPN . [facility] [place] . 1 bag for 12 hours. [phone]    [provider]  promethazine (PHENERGAN) 25 MG tablet TAKE 1 TABLET BY MOUTH EVERY 6 HOURS AS NEEDED FOR NAUSEA FOR VOMITING 01/15/22   [name], [name], MD  sodium chloride 0.9 % infusion Inject 1,000 mLs into the vein daily. For chronic dehydration 10/16/20   [provider]  sucralfate (CARAFATE) 1 GM/10ML suspension Take 10 mLs (1 g total) by mouth 2 (two) times daily. 06/30/21   Eugenie Filler, MD  Trace Minerals Cu-Mn-Se-Zn (TRALEMENT IV) Inject 1 mL into the vein See admin instructions. Used in TPN bag 4 times weekly    [provider]  vitamin B-12 (CYANOCOBALAMIN) 1000 MCG tablet Take 1,000 mcg by mouth daily.     [provider]      Allergies    Meperidine, Hyoscyamine, Cefepime, Gabapentin, Lyrica [pregabalin], Topamax [topiramate], Zosyn [piperacillin sod-tazobactam so], Fentanyl, and Morphine and related    Review of Systems   Review of Systems  All other systems reviewed and are negative.  Physical Exam Updated Vital Signs BP (!) 141/61 (BP Location: Left Arm)    Pulse 73    Temp 98 F (36.7 C) (Oral)    Resp 20    Ht 1.727 m (5' 8" )    Wt 59.4 kg    SpO2 100%    BMI 19.91 kg/m  Physical Exam Vitals and nursing note reviewed.  Constitutional:      General: She is not in acute distress.    Appearance: Normal appearance. She is well-developed. She is not toxic-appearing.  HENT:     Head: Normocephalic and atraumatic.  Eyes:     General: Lids are normal.      Conjunctiva/sclera: Conjunctivae normal.     Pupils: Pupils are equal, round, and reactive to light.  Neck:     Thyroid: No thyroid mass.     Trachea: No tracheal deviation.  Cardiovascular:     Rate and Rhythm: Normal rate and regular rhythm.     Heart sounds: Normal heart sounds. No murmur heard.   No gallop.  Pulmonary:     Effort: Pulmonary effort is normal. No respiratory distress.     Breath sounds: Normal breath sounds. No stridor. No decreased breath sounds, wheezing, rhonchi or rales.  Abdominal:     General: There is no  distension.     Palpations: Abdomen is soft.     Tenderness: There is generalized abdominal tenderness. There is no rebound.  Musculoskeletal:        General: No tenderness. Normal range of motion.     Cervical back: Normal range of motion and neck supple.  Skin:    General: Skin is warm and dry.     Findings: No abrasion or rash.  Neurological:     Mental Status: She is alert and oriented to person, place, and time. Mental status is at baseline.     GCS: GCS eye subscore is 4. GCS verbal subscore is 5. GCS motor subscore is 6.     Cranial Nerves: No cranial nerve deficit.     Sensory: No sensory deficit.     Motor: Motor function is intact.  Psychiatric:        Attention and Perception: Attention normal.        Speech: Speech normal.        Behavior: Behavior normal.    ED Results / Procedures / Treatments   Labs (all labs ordered are listed, but only abnormal results are displayed) Labs Reviewed - No data to display  EKG None  Radiology No results found.  Procedures Procedures    Medications Ordered in ED Medications  lactated ringers bolus 1,000 mL (has no administration in time range)  lactated ringers infusion (has no administration in time range)  HYDROmorphone (DILAUDID) injection 1 mg (has no administration in time range)  diphenhydrAMINE (BENADRYL) injection 25 mg (has no administration in time range)  ondansetron (ZOFRAN)  injection 4 mg (has no administration in time range)    ED Course/ Medical Decision Making/ A&P                           Medical Decision Making Amount and/or Complexity of Data Reviewed Labs: ordered. Radiology: ordered.  Risk Prescription drug management.  Old records from out of system reviewed Patient given IV fluids here as well as parenteral opiates.  Also given antiemetics here.  Concern for possible bowel obstruction and therefore abdominal CT performed.  CT scans looked at and agree with radiology interpretation of worsening colitis.  Patient started on IV Solu-Medrol.  Patient continues to note worsening discomfort and will be redosed with IV hydromorphone here.  She will require admission at this time        Final Clinical Impression(s) / ED Diagnoses Final diagnoses:  None    Rx / DC Orders ED Discharge Orders     None         Lacretia Leigh, MD 01/17/22 [phone]

## 2022-01-17 NOTE — ED Notes (Signed)
Pt assisted on and off the bedpan

## 2022-01-17 NOTE — H&P (Signed)
History and Physical    Patient: Caitlin Peters UOH:[id number] DOB: [birth year] DOA: 01/17/2022 DOS: the patient was seen and examined on 01/17/2022 PCP: [name], [name], MD  Patient coming from: Home  Chief Complaint:  Chief Complaint  Patient presents with   Nausea   Emesis    HPI: Caitlin Peters is a 61 y.o. female with medical history significant of acute pancreatitis, anasarca, mild osteonecrosis of the bone, cataract, choledocholithiasis, chronic pain syndrome, stage IIIb CKD, depression, GERD, iron deficiency anemia, multiple small bowel resection, short gut syndrome, malnutrition on TPN at bedtime, mass in the chest history, osteoporosis, history of pancreatitis episodes, vitamin B12 deficiency who is coming to the emergency department due to abdominal pain with subjective fever, persistent nausea and multiple episodes of emesis in the last few days.  Denied hematemesis, melena or hematochezia.  No flank pain, dysuria, frequency or hematuria.  She was seen at [facility] last month for similar symptoms.  She was seen in the ER on 01/13/2022.  No rhinorrhea, sore throat, productive cough, wheezing or hemoptysis.  No chest pain, palpitations, diaphoresis, PND, orthopnea, but occasionally gets lightheaded.  No polyuria, polydipsia, polyphagia or blurred vision.  ED course: Initial vital signs were temperature 98 F, pulse 73, respiration 20, BP 141/61 mmHg O2 sat 100% on room air.  The patient received a 1000 mL LR bolus, Benadryl 25 mg IVP, hydromorphone 1 mg IVP x2, methylprednisolone 125 mg IVP x1 and ondansetron 4 mg IVP x1.  Lab work: CBC is her white count 3.6, hemoglobin 8.7 g/dL platelets 148.  Lipase was normal.  CMP showed normal electrolytes when calcium was corrected to albumin level.  Glucose 105, BUN 33 and creatinine 1.40 mg/dL.  Total protein 6.1 and albumin 2.7 g/dL.  Alk phos, transaminases and total bilirubin were normal.  Imaging: CT abdomen/pelvis without contrast  shows significant thickening of the colonic wall primarily involving the descending colon through the rectum, which has progressed since the study done 4 days earlier with suspicions for Crohn's colitis.  There was also stomach wall thickening suspicious for gastritis.  Pneumobilia which may be related to her prior sphincterotomy.  There was no evidence of organized or drainable fluid collection in the abdomen or pelvis.  Please see images and full radiology report for further details.  Review of Systems: As mentioned in the history of present illness. All other systems reviewed and are negative. 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; 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; jejunal to transverse colon anastomosis Treated at [place] SB lengthening to 165cm Dr Alene Mires, [facility]   Vitamin B12 deficiency    Past Surgical History:  Procedure Laterality Date   ABDOMINAL ADHESION SURGERY  01/22/2018   APPENDECTOMY  1989   BILIARY DILATION  11/26/2019   Procedure: BILIARY  DILATION;  Surgeon: Jackquline Denmark, MD;  Location: [facility];  Service: Endoscopy;;   BILIARY DILATION  03/08/2020   Procedure: BILIARY DILATION;  Surgeon: Irving Copas., MD;  Location: [place];  Service: Gastroenterology;;   BIOPSY  03/08/2020   Procedure: BIOPSY;  Surgeon: Irving Copas., MD;  Location: [place];  Service: Gastroenterology;;   CHEST WALL RESECTION     right thoracotomy,resection of chest mass with anterior rib and reconstruction using prosthetic mesh and video arthroscopy   CHOLECYSTECTOMY  01/22/2018   COLONOSCOPY  2019   COLONOSCOPY WITH PROPOFOL N/A 11/29/2021   Procedure: COLONOSCOPY WITH PROPOFOL;  Surgeon: Yetta Flock, MD;  Location: [facility];  Service: Gastroenterology;  Laterality: N/A;   ENTEROSTOMY CLOSURE  04/1999   ERCP N/A 11/26/2019   Procedure: ENDOSCOPIC RETROGRADE CHOLANGIOPANCREATOGRAPHY (ERCP);  Surgeon: Jackquline Denmark, MD;  Location: [facility];  Service: Endoscopy;  Laterality: N/A;   ERCP N/A 03/08/2020   Procedure: ENDOSCOPIC RETROGRADE CHOLANGIOPANCREATOGRAPHY (ERCP);  Surgeon: Irving Copas., MD;  Location: [place];  Service: Gastroenterology;  Laterality: N/A;   ESOPHAGOGASTRODUODENOSCOPY N/A 03/08/2020   Procedure: ESOPHAGOGASTRODUODENOSCOPY (EGD);  Surgeon: Irving Copas., MD;  Location: [place];  Service: Gastroenterology;  Laterality: N/A;   EUS N/A 03/08/2020   Procedure: UPPER ENDOSCOPIC ULTRASOUND (EUS) LINEAR;  Surgeon: Irving Copas., MD;  Location: [place];  Service: Gastroenterology;  Laterality: N/A;   ILEOCECETOMY  03/1999   ileocolon resection with abdominal stoma   ILEOSTOMY CLOSURE  2001   IR FLUORO GUIDE CV LINE LEFT  01/07/2020   IR FLUORO GUIDE CV LINE LEFT  03/09/2020   IR FLUORO GUIDE CV LINE LEFT  05/09/2020   IR FLUORO GUIDE CV LINE LEFT  07/20/2020   IR FLUORO GUIDE CV LINE RIGHT  08/05/2020   IR FLUORO GUIDE CV LINE RIGHT  04/03/2021   IR PERC TUN PERIT CATH WO PORT S&I /IMAG  12/05/2021   IR PTA VENOUS EXCEPT DIALYSIS CIRCUIT  01/07/2020   IR REMOVAL TUN CV CATH W/O FL  08/05/2020   IR REMOVAL TUN CV CATH W/O FL  07/11/2021   IR REMOVAL TUN CV CATH W/O FL  11/29/2021   IR US GUIDE VASC ACCESS LEFT     x 2 06/17/19 and 09/14/2019   IR US GUIDE VASC ACCESS LEFT  12/05/2021   IR US  GUIDE VASC ACCESS RIGHT  08/05/2020   KNEE SURGERY     right knee    LAPAROSCOPIC SMALL BOWEL RESECTION  2009   2000-2009.  SB resections for Crohns Disease - now with Short gut   OMENTECTOMY  01/22/2018   PARTIAL HYSTERECTOMY  1984   with LSO   REMOVAL OF STONES  11/26/2019   Procedure: REMOVAL OF STONES;  Surgeon: Jackquline Denmark, MD;  Location: [facility];  Service: Endoscopy;;   REMOVAL OF STONES  03/08/2020   Procedure: REMOVAL OF STONES;  Surgeon: Irving Copas., MD;  Location: [facility];  Service: Gastroenterology;;   SALPINGOOPHORECTOMY Left 1984   SALPINGOOPHORECTOMY Right 1990   SERIAL TRANSVERSE ENTEROPLASTY (STEP) - SMALL BOWEL LENGTHENING  01/22/2018   Dr Alene Mires, [facility] - SB length from 120 to 165cm    SMALL INTESTINE SURGERY  2002   SMALL INTESTINE SURGERY  2003   SPHINCTEROTOMY  11/26/2019   Procedure: SPHINCTEROTOMY;  Surgeon: Jackquline Denmark, MD;  Location: [facility];  Service: Endoscopy;;   TOTAL ABDOMINAL HYSTERECTOMY  1990   with RSO   UPPER GASTROINTESTINAL ENDOSCOPY  Social History:  reports that she has quit smoking. Her smoking use included cigarettes. She has a 17.50 pack-year smoking history. She has never used smokeless tobacco. She reports that she does not currently use alcohol. She reports that she does not use drugs.  Allergies  Allergen Reactions   Meperidine Hives    Other reaction(s): GI Upset Due to [place]    Hyoscyamine Hives and Swelling    Legs swelling Disorientation   Cefepime Other (See Comments)    Neurotoxicity occurring in setting of AKI. Ceftriaxone tolerated during same admit   Gabapentin Other (See Comments)    unknown   Lyrica [Pregabalin] Other (See Comments)    Has chronic dehydration and made it worse   Topamax [Topiramate] Other (See Comments)    Has chronic dehydration and made it worse   Zosyn [Piperacillin Sod-Tazobactam So] Other (See Comments)    Patient reports it makes her vomit, her neck  stiff, and her "heart feel funny" Affected kidneys   Fentanyl Rash    Pt is allergic to fentanyl patch related to the glue (gives her a rash) Pt states she is NOT allergic to fentanyl IV medicine   Morphine And Related Rash    Family History  Problem Relation Age of Onset   Seizures Mother    Glaucoma Mother    CAD Father    Heart disease Father    Hypertension Father    Breast cancer Sister    Multiple sclerosis Sister    Diabetes Sister    Lupus Sister    Colon cancer Other    Crohn's disease Other     Prior to Admission medications   Medication Sig Start Date End Date Taking? Authorizing Provider  acetaminophen (TYLENOL) 650 MG CR tablet Take 1,300 mg by mouth every 6 (six) hours.   Yes [provider]  amLODipine (NORVASC) 10 MG tablet Take 1 tablet by mouth once daily Patient taking differently: Take 10 mg by mouth daily. 12/29/21  Yes [name], [name], MD  buPROPion ER [facility] SR) 100 MG 12 hr tablet Take 2 tablets by mouth once daily Patient taking differently: 200 mg daily. 08/30/21  Yes [name], [name], MD  Calcium 500 MG tablet Take 500 mg by mouth daily.   Yes [provider]  carvedilol (COREG) 25 MG tablet Take 1 tablet (25 mg total) by mouth 2 (two) times daily. 12/26/21  Yes Chandrasekhar, Mahesh A, MD  cholecalciferol (VITAMIN D3) 25 MCG (1000 UT) tablet Take 1,000 Units by mouth daily.    Yes [provider]  cycloSPORINE (RESTASIS) 0.05 % ophthalmic emulsion Place 1 drop into both eyes 2 (two) times daily.   Yes [provider]  DEXILANT 60 MG capsule Take 1 capsule by mouth once daily Patient taking differently: Take 60 mg by mouth daily. 11/21/21  Yes [name], [name], MD  diphenoxylate-atropine (LOMOTIL) 2.5-0.025 MG tablet Take 1 tablet by mouth 4 (four) times daily. 09/04/21  Yes [provider]  DULoxetine (CYMBALTA) 30 MG capsule Take 90 mg by mouth daily.   Yes [provider]  estradiol (ESTRACE) 2 MG tablet Take 1 tablet by mouth once daily Patient taking differently: Take 2 mg by mouth daily. 10/03/21  Yes [name], [name], MD  famotidine (PEPCID) 20 MG tablet Take 20 mg by mouth at bedtime.   Yes [provider]  furosemide (LASIX) 20 MG tablet Take 20 mg by mouth as needed. swelling 12/22/21  Yes  [provider]  GATTEX 5 MG KIT Inject 1.5 mg into the skin daily. 01/03/21  Yes [provider]  hydrALAZINE (APRESOLINE) 100 MG tablet Take 1 tablet (100 mg total) by mouth 3 (three) times daily. 11/07/21  Yes [name], [name], MD  HYDROmorphone (DILAUDID) 4 MG tablet Take 4 mg by mouth 4 (four) times daily. 04/18/21  Yes [provider]  levETIRAcetam (KEPPRA) 500 MG tablet Take 1 tablet (500 mg total) by mouth 2 (two) times daily. 08/28/21  Yes Cameron Sprang, MD  lipase/protease/amylase (CREON) 36000 UNITS CPEP capsule Take 1 capsule (36,000 Units total) by mouth See admin instructions. 36000 units with meals Patient taking differently: Take 36,000 Units by mouth 3 (three) times daily with meals. 36000 units with meals 06/30/21  Yes Eugenie Filler, MD  loperamide (IMODIUM) 2 MG capsule Take 4 mg by mouth 2 (two) times daily as needed for diarrhea or loose stools. 09/26/21  Yes [provider]  methadone (DOLOPHINE) 5 MG tablet Take 1 tablet (5 mg total) by mouth 5 (five) times daily. 09/29/21  Yes Little Ishikawa, MD  methocarbamol (ROBAXIN) 500 MG tablet Take 1 tablet (500 mg total) by mouth in the morning and at bedtime. 12/20/21  Yes [name], [name], MD  metoprolol tartrate (LOPRESSOR) 100 MG tablet Take 100 mg by mouth 2 (two) times daily.   Yes [provider]  Multiple Vitamins-Minerals (MULTIVITAMIN ADULT PO) Take 1 tablet by mouth daily.   Yes [provider]  NARCAN 4 MG/0.1ML LIQD nasal spray kit Place 1 spray into the nose once as needed (overdose).  10/14/20  Yes [provider]  Omega-3 1400 MG CAPS Take 1,400 mg by mouth daily.   Yes [provider]  ondansetron (ZOFRAN) 4 MG tablet Take 4 mg by mouth every 8 (eight) hours as needed for nausea or vomiting. 11/18/21  Yes [provider]  polyvinyl alcohol (LIQUIFILM TEARS) 1.4 % ophthalmic solution Place 1 drop into both eyes daily as needed for dry eyes.   Yes [provider]  promethazine (PHENERGAN) 25 MG tablet TAKE 1 TABLET BY MOUTH EVERY 6 HOURS AS NEEDED FOR NAUSEA FOR VOMITING Patient taking differently: Take 25 mg by mouth every 6 (six) hours as needed for nausea or vomiting. 01/15/22  Yes [name], [name], MD  sodium chloride 0.9 % infusion Inject 1,000 mLs into the vein daily. For chronic dehydration 10/16/20  Yes [provider]  Trace Minerals Cu-Mn-Se-Zn (TRALEMENT IV) Inject 1 mL into the vein See admin instructions. Used in TPN bag 4 times weekly   Yes [provider]  traZODone (DESYREL) 100 MG tablet Take 200 mg by mouth at bedtime.   Yes [provider]  vitamin B-12 (CYANOCOBALAMIN) 1000 MCG tablet Take 1,000 mcg by mouth daily.    Yes [provider]  PRESCRIPTION MEDICATION Inject 1 each into the vein daily. Home TPN . [facility] . 1 bag for 12 hours. [phone]    [provider]    Physical Exam: Vitals:   01/17/22 1300 01/17/22 1330 01/17/22 1400 01/17/22 1430  BP: 131/60 139/67 139/66 (!) 145/71  Pulse: 64 66  63  Resp: 16     Temp:      TempSrc:      SpO2: 98% 100%  99%  Weight:      Height:       Physical Exam Vitals and nursing note reviewed.  HENT:     Head: Normocephalic.     Mouth/Throat:     Mouth: Mucous membranes are dry.     Pharynx: No oropharyngeal exudate or posterior oropharyngeal erythema.  Eyes:     Pupils: Pupils are equal, round, and reactive to light.  Cardiovascular:     Rate and Rhythm: Normal rate and regular  rhythm.  Pulmonary:     Effort: Pulmonary effort is normal.     Breath sounds: Normal breath sounds.  Abdominal:     General: Abdomen is flat. There is no distension.     Palpations: Abdomen is soft.     Tenderness: There is generalized abdominal tenderness. There is no guarding or rebound.  Musculoskeletal:     Cervical back: Neck supple.     Right lower leg: No edema.     Left lower leg: No edema.  Skin:    General: Skin is dry.  Neurological:     General: No focal deficit present.     Mental Status: She is alert and oriented to person, place, and time.  Psychiatric:        Mood and Affect: Mood normal.   Data Reviewed:  There are no new results to review at this time.  Assessment and Plan: Principal Problem:   Chronic pain syndrome   Exacerbation of Crohn's disease ([place]) Observation/MedSurg. Continue IV fluids. Declining hydromorphone PCA. Continue hydromorphone 1 mg q 2 hr PRN. Antiemetics as needed. Continue pantoprazole 40 mg every 24 hours. No further glucocorticoids per GI. GI consult greatly appreciated.  Active Problems:   Essential hypertension Pain control. Parenteral antihypertensive as needed.    Short gut syndrome   On total parenteral nutrition (TPN) Pharmacy will begin TPN tomorrow.    GERD (gastroesophageal reflux disease) Currently on parenteral PPI.    Pancytopenia ([place]) In the setting along nutrition. Follow-up CBC closely.    Chronic kidney disease, stage 3b ([name]) Continue IV fluids.   Monitor renal function electrolytes.    Advance Care Planning:   Code Status: Full Code  Consults: [facility] Wilfrid Lund III. MD and Nicoletta Ba. PA-C.  Family Communication:   Severity of Illness: The appropriate patient status for this patient is OBSERVATION. Observation status is judged to be reasonable and necessary in order to provide the required intensity of service to ensure the patient's safety. [name] presenting symptoms,  physical exam findings, and initial radiographic and laboratory data in the context of their medical condition is felt to place them at decreased risk for further clinical deterioration. Furthermore, it is anticipated that the patient will be medically stable for discharge from the hospital within 2 midnights of admission.   Author: Reubin Milan, MD 01/17/2022 3:44 PM  For on call review [email or web address].   This document was prepared using Par[employer]ic and may contain some unintended transcription errors.

## 2022-01-17 NOTE — ED Triage Notes (Signed)
Pt BIB EMS from home. She has a hx of Crohns Disease and presents w/ intense abdominal pain with N/V. Emesis has a "dark bile" appearance.  She has been given fluids through her picc line in her left thigh.   140/80 bp 16 rr  100 hr 124 cbg

## 2022-01-18 ENCOUNTER — Other Ambulatory Visit: Payer: Self-pay

## 2022-01-18 DIAGNOSIS — K219 Gastro-esophageal reflux disease without esophagitis: Secondary | ICD-10-CM | POA: Diagnosis present

## 2022-01-18 DIAGNOSIS — K8689 Other specified diseases of pancreas: Secondary | ICD-10-CM | POA: Diagnosis present

## 2022-01-18 DIAGNOSIS — K912 Postsurgical malabsorption, not elsewhere classified: Secondary | ICD-10-CM

## 2022-01-18 DIAGNOSIS — M810 Age-related osteoporosis without current pathological fracture: Secondary | ICD-10-CM | POA: Diagnosis present

## 2022-01-18 DIAGNOSIS — R112 Nausea with vomiting, unspecified: Secondary | ICD-10-CM | POA: Diagnosis present

## 2022-01-18 DIAGNOSIS — Z79891 Long term (current) use of opiate analgesic: Secondary | ICD-10-CM | POA: Diagnosis not present

## 2022-01-18 DIAGNOSIS — E46 Unspecified protein-calorie malnutrition: Secondary | ICD-10-CM | POA: Diagnosis present

## 2022-01-18 DIAGNOSIS — Z8249 Family history of ischemic heart disease and other diseases of the circulatory system: Secondary | ICD-10-CM | POA: Diagnosis not present

## 2022-01-18 DIAGNOSIS — N1832 Chronic kidney disease, stage 3b: Secondary | ICD-10-CM | POA: Diagnosis present

## 2022-01-18 DIAGNOSIS — Z79899 Other long term (current) drug therapy: Secondary | ICD-10-CM | POA: Diagnosis not present

## 2022-01-18 DIAGNOSIS — G894 Chronic pain syndrome: Secondary | ICD-10-CM | POA: Diagnosis present

## 2022-01-18 DIAGNOSIS — K5010 Crohn's disease of large intestine without complications: Secondary | ICD-10-CM | POA: Diagnosis present

## 2022-01-18 DIAGNOSIS — Z832 Family history of diseases of the blood and blood-forming organs and certain disorders involving the immune mechanism: Secondary | ICD-10-CM | POA: Diagnosis not present

## 2022-01-18 DIAGNOSIS — Z820 Family history of epilepsy and other diseases of the nervous system: Secondary | ICD-10-CM | POA: Diagnosis not present

## 2022-01-18 DIAGNOSIS — Z20822 Contact with and (suspected) exposure to covid-19: Secondary | ICD-10-CM | POA: Diagnosis present

## 2022-01-18 DIAGNOSIS — Z87891 Personal history of nicotine dependence: Secondary | ICD-10-CM | POA: Diagnosis not present

## 2022-01-18 DIAGNOSIS — Z881 Allergy status to other antibiotic agents status: Secondary | ICD-10-CM | POA: Diagnosis not present

## 2022-01-18 DIAGNOSIS — Z803 Family history of malignant neoplasm of breast: Secondary | ICD-10-CM | POA: Diagnosis not present

## 2022-01-18 DIAGNOSIS — R1084 Generalized abdominal pain: Secondary | ICD-10-CM | POA: Diagnosis not present

## 2022-01-18 DIAGNOSIS — I129 Hypertensive chronic kidney disease with stage 1 through stage 4 chronic kidney disease, or unspecified chronic kidney disease: Secondary | ICD-10-CM | POA: Diagnosis present

## 2022-01-18 DIAGNOSIS — I10 Essential (primary) hypertension: Secondary | ICD-10-CM | POA: Diagnosis not present

## 2022-01-18 DIAGNOSIS — D61818 Other pancytopenia: Secondary | ICD-10-CM | POA: Diagnosis present

## 2022-01-18 DIAGNOSIS — Z833 Family history of diabetes mellitus: Secondary | ICD-10-CM | POA: Diagnosis not present

## 2022-01-18 DIAGNOSIS — Z83511 Family history of glaucoma: Secondary | ICD-10-CM | POA: Diagnosis not present

## 2022-01-18 DIAGNOSIS — Z885 Allergy status to narcotic agent status: Secondary | ICD-10-CM | POA: Diagnosis not present

## 2022-01-18 LAB — CBC
HCT: 23.6 % — ABNORMAL LOW (ref 36.0–46.0)
Hemoglobin: 7.9 g/dL — ABNORMAL LOW (ref 12.0–15.0)
MCH: 30.6 pg (ref 26.0–34.0)
MCHC: 33.5 g/dL (ref 30.0–36.0)
MCV: 91.5 fL (ref 80.0–100.0)
Platelets: 160 10*3/uL (ref 150–400)
RBC: 2.58 MIL/uL — ABNORMAL LOW (ref 3.87–5.11)
RDW: 14.6 % (ref 11.5–15.5)
WBC: 4.2 10*3/uL (ref 4.0–10.5)
nRBC: 0 % (ref 0.0–0.2)

## 2022-01-18 LAB — COMPREHENSIVE METABOLIC PANEL
ALT: 15 U/L (ref 0–44)
AST: 14 U/L — ABNORMAL LOW (ref 15–41)
Albumin: 2.6 g/dL — ABNORMAL LOW (ref 3.5–5.0)
Alkaline Phosphatase: 69 U/L (ref 38–126)
Anion gap: 4 — ABNORMAL LOW (ref 5–15)
BUN: 29 mg/dL — ABNORMAL HIGH (ref 6–20)
CO2: 23 mmol/L (ref 22–32)
Calcium: 7.6 mg/dL — ABNORMAL LOW (ref 8.9–10.3)
Chloride: 109 mmol/L (ref 98–111)
Creatinine, Ser: 1.5 mg/dL — ABNORMAL HIGH (ref 0.44–1.00)
GFR, Estimated: 40 mL/min — ABNORMAL LOW (ref >60)
Glucose, Bld: 120 mg/dL — ABNORMAL HIGH (ref 70–99)
Potassium: 4.2 mmol/L (ref 3.5–5.1)
Sodium: 136 mmol/L (ref 135–145)
Total Bilirubin: 0.5 mg/dL (ref 0.3–1.2)
Total Protein: 5.5 g/dL — ABNORMAL LOW (ref 6.5–8.1)

## 2022-01-18 LAB — C DIFFICILE QUICK SCREEN W PCR REFLEX
C Diff antigen: NEGATIVE
C Diff interpretation: NOT DETECTED
C Diff toxin: NEGATIVE

## 2022-01-18 LAB — GLUCOSE, CAPILLARY
Glucose-Capillary: 96 mg/dL (ref 70–99)
Glucose-Capillary: 131 mg/dL — ABNORMAL HIGH (ref 70–99)

## 2022-01-18 MED ORDER — PROMETHAZINE HCL 25 MG RE SUPP: RECTAL | 1 refills | Status: AC

## 2022-01-18 MED ORDER — BOOST / RESOURCE BREEZE PO LIQD CUSTOM
1.0000 | Freq: Three times a day (TID) | ORAL | Status: DC
  Administered 2022-01-18 – 2022-01-19 (×3): 1 via ORAL

## 2022-01-18 MED ORDER — ONDANSETRON 4 MG PO TBDP: ORAL_TABLET | ORAL | 1 refills | Status: AC

## 2022-01-18 MED ORDER — NON FORMULARY: 0.0500 mg/kg | Freq: Every day | Status: DC

## 2022-01-18 MED ORDER — INSULIN ASPART 100 UNIT/ML IJ SOLN: 0.0000 [IU] | Freq: Three times a day (TID) | INTRAMUSCULAR | Status: DC

## 2022-01-18 MED ORDER — TRAVASOL 10 % IV SOLN
INTRAVENOUS | Status: AC
  Filled 2022-01-18: qty 900

## 2022-01-18 MED ORDER — INSULIN ASPART 100 UNIT/ML IJ SOLN: 0.0000 [IU] | Freq: Every day | INTRAMUSCULAR | Status: DC

## 2022-01-18 MED ORDER — TEDUGLUTIDE (RDNA) 5 MG ~~LOC~~ KIT
5.0000 ug | PACK | Freq: Every day | SUBCUTANEOUS | Status: DC
  Administered 2022-01-18 – 2022-01-19 (×2): 5 ug via SUBCUTANEOUS

## 2022-01-18 MED ORDER — HYDRALAZINE HCL 20 MG/ML IJ SOLN
10.0000 mg | Freq: Four times a day (QID) | INTRAMUSCULAR | Status: DC | PRN
  Administered 2022-01-18 – 2022-01-19 (×3): 10 mg via INTRAVENOUS
  Filled 2022-01-18 (×3): qty 1

## 2022-01-18 NOTE — Assessment & Plan Note (Signed)
Hemoglobin of 7.9 at this time.  Platelet 160 and WBC of 4.2.

## 2022-01-18 NOTE — Hospital Course (Addendum)
Caitlin Peters is a 61 y.o. female with medical history significant of acute pancreatitis, anasarca, mild osteonecrosis of the bone, cataract, choledocholithiasis, chronic pain syndrome, stage IIIb CKD, depression, GERD, iron deficiency anemia, multiple small bowel resection, short gut syndrome, malnutrition on TPN at bedtime, mass in the chest, osteoporosis, history of pancreatitis episodes, vitamin B12 deficiency presented to hospital with abdominal pain and subjective fever nausea and multiple episodes of vomiting.  Patient was seen in the ER for similar symptoms on 01/13/2022.  In the ED, patient had stable vitals.  Labs showed WBC at 3.6 platelet 148.  Albumin was 2.7.  LFTs were within normal limits. CT abdomen/pelvis without contrast shows significant thickening of the colonic wall primarily involving the descending colon through the rectum, which has progressed since the study done 4 days earlier with suspicions for Crohn's colitis.  There was also stomach wall thickening suspicious for gastritis.  Pneumobilia which may be related to her prior sphincterotomy.  There was no evidence of organized or drainable fluid collection in the abdomen or pelvis. Patient received 1 L of Ringer lactate bolus hydromorphone Solu-Medrol Zofran and was admitted hospital for further evaluation and treatment.  GI was consulted as well.

## 2022-01-18 NOTE — Progress Notes (Signed)
PHARMACY - TOTAL PARENTERAL NUTRITION CONSULT NOTE   Indication: Short gut syndrome.  Patient Measurements: Height: 5' 8"  (172.7 cm) Weight: 59.4 kg (130 lb 15.3 oz) IBW/kg (Calculated) : 63.9 TPN AdjBW (KG): 59.4 Body mass index is 19.91 kg/m. Usual Weight:   Assessment:  Pt on chronic TPN PTA   Glucose / Insulin: WNL, no hx of DM Electrolytes: WNl, CorrCa is 9.2  Renal: Scr 1.50, BUN 29  Hepatic: LFTs WNL  Intake / Output; MIVF: LR at 75 ml/hr, 253 ml charted, likely incomplete  GI Imaging: 2/18 significant thickening of colonic wall seen  GI Surgeries / Procedures:   Central access: PTA  TPN start date: on TPN PTA   Nutritional Goals: pending Goal TPN rate is -- mL/hr (provides --- g of protein and --- kcals per day)  RD Assessment:  - pending     Current Nutrition:  Clear liquids  Plan:  Cyclic TPN   at 4287 Start rate at 82 mL/hr for 1 hours Increase rate to 164 mL/hr for 10 hours. Decrease rate to 82 mL/hr for 1 hours, then stop. Electrolytes in TPN: Na 745mq/L, K 326m/L, Ca 45m66mL, Mg 5mE35m, and Phos 7mmo63m. Cl:Ac 1:1 Add  trace elements to TPN Will add multivitamin MWF for to current shortage  Initiate Sensitive ACHS SSI and adjust as needed  DC MIVF after discussion with MD  Monitor TPN labs on Mon/Thurs   NikolRoyetta AsalrmD, BCPS 01/18/2022 10:40 AM

## 2022-01-18 NOTE — Plan of Care (Signed)
°  Problem: Health Behavior/Discharge Planning: Goal: Ability to manage health-related needs will improve Outcome: Progressing   Problem: Clinical Measurements: Goal: Cardiovascular complication will be avoided Outcome: Progressing   Problem: Activity: Goal: Risk for activity intolerance will decrease Outcome: Progressing   Problem: Nutrition: Goal: Adequate nutrition will be maintained Outcome: Progressing   Problem: Coping: Goal: Level of anxiety will decrease Outcome: Progressing

## 2022-01-18 NOTE — Progress Notes (Signed)
Initial Nutrition Assessment  DOCUMENTATION CODES:   Non-severe (moderate) malnutrition in context of chronic illness  INTERVENTION:  - TPN initiation and management per Pharmacist. - will order Boost Breeze TID, each supplement provides 250 kcal and 9 grams of protein.   NUTRITION DIAGNOSIS:   Moderate Malnutrition related to chronic illness (short gut syndrome) as evidenced by mild fat depletion, moderate muscle depletion, severe muscle depletion.  GOAL:   Patient will meet greater than or equal to 90% of their needs  MONITOR:   PO intake, Supplement acceptance, Labs, Weight trends, Other (Comment) (TPN regimen)  REASON FOR ASSESSMENT:   Malnutrition Screening Tool, Consult New TPN/TNA  ASSESSMENT:   61 y.o. female with medical history of acute pancreatitis, anasarca, mild osteonecrosis of the bone, cataract, choledocholithiasis, chronic pain syndrome, stage 3 CKD, depression, GERD, iron deficiency anemia, multiple small bowel resections with resultant short gut syndrome, malnutrition on TPN at bedtime, osteoporosis, and vitamin B12 deficiency. She presented to the ED due to abdominal pain, fever, and N/V. She was seen in the ED for similar symptoms on 2/18. CT abdomen/pelvis without contrast showed significant thickening of the colonic wall primarily involving the descending colon through the rectum, which has progressed since the study done 4 days earlier with suspicions for Crohn's colitis. There was also stomach wall thickening suspicious for gastritis. She was admitted for exacerbation of Crohns disease.  Patient laying in bed with no visitors present at the time of RD visit. Patient is well known to this RD from previous admissions. She has brought her Gattex from home.   Patient has tunneled double lumen CVC in L femoral. Plan for cyclic TPN (N36 hours/day) to start today.   Patient reports 1 week hx of worsening abdominal pain and N/V. Appetite and PO intakes were at  baseline prior to that. Symptoms are now well controlled and she was able to eat several clear and full liquid-type items for breakfast without issue. Diet has now been advanced to Regular.   She enjoys Librarian, academic and RNs have been providing them to her when she requests; patient agreeable to order for Boost Breeze TID.   Weight yesterday was 131 lb and weight on 1/25 was 136 lb. This indicates 5 lb weight loss (3.7% body weight) in the past 1 month; not significant for time frame.    Labs reviewed; BUN: 29 mg/dl, creatinine: 1.5 mg/dl, Ca: 7.6 mg/dl, GFR: 40 ml/min. Medications reviewed; 40 mg IV protonix/day. IVF; LR @ 75 ml/hr.    NUTRITION - FOCUSED PHYSICAL EXAM:  Flowsheet Row Most Recent Value  Orbital Region Mild depletion  Upper Arm Region Moderate depletion  Thoracic and Lumbar Region Unable to assess  Buccal Region Mild depletion  Temple Region Mild depletion  Clavicle Bone Region Moderate depletion  Clavicle and Acromion Bone Region Moderate depletion  Scapular Bone Region Moderate depletion  Dorsal Hand Mild depletion  Patellar Region Severe depletion  Anterior Thigh Region Severe depletion  Posterior Calf Region Moderate depletion  Edema (RD Assessment) None  Hair Reviewed  Eyes Reviewed  Mouth Reviewed  Skin Reviewed  Nails Reviewed       Diet Order:   Diet Order             Diet regular Room service appropriate? Yes; Fluid consistency: Thin  Diet effective now                   EDUCATION NEEDS:   Education needs have been addressed  Skin:  Skin  Assessment: Reviewed RN Assessment  Last BM:  2/26 (type 6)  Height:   Ht Readings from Last 1 Encounters:  01/17/22 5' 8"  (1.727 m)    Weight:   Wt Readings from Last 1 Encounters:  01/17/22 59.4 kg     BMI:  Body mass index is 19.91 kg/m.  Estimated Nutritional Needs:  Kcal:  2000-2200 kcal Protein:  100-115 grams Fluid:  >/= 2 L/day     Jarome Matin, MS, RD,  LDN Inpatient Clinical Dietitian RD pager # available in [place]  After hours/weekend pager # available in [facility]

## 2022-01-18 NOTE — Assessment & Plan Note (Addendum)
Continue to monitor BMP as outpatient.

## 2022-01-18 NOTE — Assessment & Plan Note (Addendum)
Seen by GI.  No indication for steroids at this time.  Pain control antiemetics, IV fluid hydration.  Has improved

## 2022-01-18 NOTE — Assessment & Plan Note (Addendum)
Improved after hydration.  Continue pain control as outpatient.

## 2022-01-18 NOTE — Assessment & Plan Note (Addendum)
Continue pain control,. On amlodipine and Coreg Lasix hydralazine and metoprolol at home.  Caitlin Peters

## 2022-01-18 NOTE — Progress Notes (Signed)
PROGRESS NOTE    NIOMIE ENGLERT  OIB:[id number] DOB: 1961-02-11 DOA: 01/17/2022 PCP: [name], [name], MD    Brief Narrative:  Caitlin Peters is a 61 y.o. female with medical history significant of acute pancreatitis, anasarca, mild osteonecrosis of the bone, cataract, choledocholithiasis, chronic pain syndrome, stage IIIb CKD, depression, GERD, iron deficiency anemia, multiple small bowel resection, short gut syndrome, malnutrition on TPN at bedtime, mass in the chest, osteoporosis, history of pancreatitis episodes, vitamin B12 deficiency presented to hospital with abdominal pain and subjective fever nausea and multiple episodes of vomiting.  Patient was seen in the ER for similar symptoms on 01/13/2022.  In the ED, patient had stable vitals.  Labs showed WBC at 3.6 platelet 148.  Albumin was 2.7.  LFTs were within normal limits. CT abdomen/pelvis without contrast shows significant thickening of the colonic wall primarily involving the descending colon through the rectum, which has progressed since the study done 4 days earlier with suspicions for Crohn's colitis.  There was also stomach wall thickening suspicious for gastritis.  Pneumobilia which may be related to her prior sphincterotomy.  There was no evidence of organized or drainable fluid collection in the abdomen or pelvis.  Please see images and full radiology report for further details. Patient received 1 L of Ringer lactate bolus hydromorphone Solu-Medrol Zofran and was admitted hospital for further evaluation and treatment.  GI was consulted as well.     Assessment and Plan: * Nausea and vomiting- (present on admission) Improved after hydration.  Continue pain control.  Antiemetics for now.  GI on board.  Exacerbation of Crohn's disease ([place])- (present on admission) Seen by GI.  No indication for steroids at this time.  Pain control antiemetics, IV fluid hydration.  Chronic kidney disease, stage 3b ([place])- (present on  admission) Continue to monitor BMP.  On total parenteral nutrition (TPN)- (present on admission) Long-term.  Taking orally as well.  Continue during hospitalization.  Chronic pain syndrome- (present on admission) On Dilaudid 4 mg 4 times daily at home.   Patient follows up with pain management as outpatient.  She does have an appointment tomorrow and strongly wishes to keep it if possible.  GERD (gastroesophageal reflux disease)- (present on admission) Continue PPI.  Short gut syndrome- (present on admission) On TPN.  Continue supportive care.  Essential hypertension- (present on admission) Continue pain control, IV antihypertensives including hydralazine for now.  Patient takes amlodipine and Coreg Lasix hydralazine and metoprolol at home.  Will resume orally as able.     DVT prophylaxis: SCDs Start: 01/17/22 1345   Code Status:     Code Status: Full Code  Disposition: Home  Status is: Observation  The patient will require care spanning > 2 midnights and should be moved to inpatient because: IV fluids, TPN, IV narcotics, symptomatic care.    Family Communication: Spoke with the patient at bedside.  Consultants:  GI  Procedures:  None  Antimicrobials:  None  Anti-infectives (From admission, onward)    None       Subjective: Today, patient was seen and examined at bedside.  Patient feels a little better with the nausea and vomiting today.  Has improved abdominal pain.  Objective: Vitals:   01/18/22 0507 01/18/22 0755 01/18/22 0956 01/18/22 1216  BP: (!) 185/91 (!) 170/74 (!) 186/70 (!) 171/73  Pulse: 68 66 66 70  Resp: 16 16 16 16   Temp: 98 F (36.7 C)     TempSrc: Oral     SpO2:  100% 100%    Weight:      Height:        Intake/Output Summary (Last 24 hours) at 01/18/2022 1251 Last data filed at 01/18/2022 1000 Gross per 24 hour  Intake 563.75 ml  Output --  Net 563.75 ml   Filed Weights   01/17/22 1132  Weight: 59.4 kg    Physical  Examination:  General:  Average built, not in obvious distress HENT:   No scleral pallor or icterus noted. Oral mucosa is mildly dry Chest:  Clear breath sounds.  Diminished breath sounds bilaterally. No crackles or wheezes.  CVS: S1 &S2 heard. No murmur.  Regular rate and rhythm. Abdomen: Soft, nonspecific tenderness, nondistended.  Bowel sounds are heard.   Extremities: No cyanosis, clubbing or edema.  Peripheral pulses are palpable.  Right groin with central catheter. Psych: Alert, awake and oriented, normal mood CNS:  No cranial nerve deficits.  Power equal in all extremities.   Skin: Warm and dry.  No rashes noted.  Data Reviewed:   CBC: Recent Labs  Lab 01/13/22 1800 01/17/22 1157 01/18/22 0417  WBC 3.8* 3.6* 4.2  NEUTROABS 1.9 1.9  --   HGB 7.8* 8.7* 7.9*  HCT 24.0* 26.3* 23.6*  MCV 92.7 92.0 91.5  PLT 147* 148* 710    Basic Metabolic Panel: Recent Labs  Lab 01/13/22 1800 01/17/22 1157 01/18/22 0417  NA 138 137 136  K 3.8 4.2 4.2  CL 108 109 109  CO2 24 24 23   GLUCOSE 91 105* 120*  BUN 41* 33* 29*  CREATININE 1.67* 1.40* 1.50*  CALCIUM 8.5* 7.7* 7.6*    Liver Function Tests: Recent Labs  Lab 01/13/22 1800 01/17/22 1157 01/18/22 0417  AST 17 14* 14*  ALT 25 17 15   ALKPHOS 82 76 69  BILITOT 0.4 0.4 0.5  PROT 6.4* 6.1* 5.5*  ALBUMIN 2.9* 2.7* 2.6*     Radiology Studies: CT Abdomen Pelvis Wo Contrast  Result Date: 01/17/2022 CLINICAL DATA:  History of Crohn's disease, presenting with abdominal pain, nausea, vomiting EXAM: CT ABDOMEN AND PELVIS WITHOUT CONTRAST TECHNIQUE: Multidetector CT imaging of the abdomen and pelvis was performed following the standard protocol without IV contrast. RADIATION DOSE REDUCTION: This exam was performed according to the departmental dose-optimization program which includes automated exposure control, adjustment of the mA and/or kV according to patient size and/or use of iterative reconstruction technique. COMPARISON:   CT abdomen/pelvis 01/13/2022 FINDINGS: Lower chest: The lung bases are clear. The imaged heart is unremarkable. Hepatobiliary: No focal liver lesion is seen, within the confines of noncontrast technique. The gallbladder is not identified, presumed surgically absent. Pneumobilia is again seen, likely reflecting previous sphincterotomy. Pancreas: Suboptimally assessed due to lack of intravenous contrast and paucity of intraabdominal fat. No definite abnormality is seen. Spleen: Unremarkable. Adrenals/Urinary Tract: No definite adrenal lesion is seen. The kidneys are mildly atrophic bilaterally. No definite focal lesions are seen, within the confines of noncontrast technique. No stones are seen. There is no hydronephrosis or hydroureter. The bladder is unremarkable. Stomach/Bowel: The bowel is suboptimally assessed due to lack of intravenous or enteric contrast. The stomach wall appears thickened, though suboptimally assessed. There is significant thickening of the colonic wall most notably involving the descending colon through the rectum, progressed since the prior study from 01/13/2022. Suture material is seen throughout the abdomen likely reflecting a history of bowel resection. Vascular/Lymphatic: There is mild calcified atherosclerotic plaque in the abdominal aorta. There is no evidence of aneurysm. A left femoral approach  venous catheter is in place terminating at the inferior cavoatrial junction, unchanged. There is no definite pathologic lymphadenopathy in the abdomen or pelvis. Reproductive: The uterus is surgically absent. There is no adnexal mass. Other: There is mild stranding throughout the abdominal fat, likely reactive/inflammatory. There is no definite organized or drainable fluid collection. There is no definite free intraperitoneal air. Musculoskeletal: There is no acute osseous abnormality or aggressive osseous lesion. IMPRESSION: 1. Significant thickening of the colonic wall primarily involving the  descending colon through the rectum, progressed since the study from 01/13/2022, likely reflecting colitis in the setting of Crohn's disease. There is also thickening of the stomach wall, which could reflect concomitant gastritis. 2. No definite evidence of organized or drainable fluid collection in the abdomen or pelvis. 3. Pneumobilia again seen which may be related to prior sphincterotomy. Electronically Signed   By: Valetta Mole M.D.   On: 01/17/2022 12:35      LOS: 0 days    Flora Lipps, MD [facility] 01/18/2022, 12:51 PM

## 2022-01-18 NOTE — Assessment & Plan Note (Addendum)
On Dilaudid 4 mg 4 times daily at home.   Patient follows up with pain management as outpatient.  She does have an appointment today

## 2022-01-18 NOTE — Assessment & Plan Note (Addendum)
Long-term.  Taking food orally as well.  Continue TPN discharge

## 2022-01-18 NOTE — Progress Notes (Signed)
Patient ID: Caitlin Peters, female   DOB: March 18, 1961, 61 y.o.   MRN: [id number]    Progress Note   Subjective   Day # 2 CC; abdominal pain nausea vomiting   TPN/nocturnal  WBC 4.2/hemoglobin 7.9/hematocrit 23.6 BUN 29/creatinine 1.5 C. difficile quick screen negative  Patient is feeling much better today, pretty much back to baseline, she is tolerating full liquids without difficulty and would like to try to advance her diet.  She has not had any nausea or vomiting and pain is under good control as she has been getting her regimen IV She is due for her Gattex injection which she does daily Thus far TPN not initiated She has outpatient follow-up with pain management on Friday, she says she will not be able to get refill on her chronic narcotics if she does not attend that appointment.  Diarrhea has slowed, stools are mushy which is her norm    Objective   Vital signs in last 24 hours: Temp:  [97.6 F (36.4 C)-98.8 F (37.1 C)] 98 F (36.7 C) (02/23 0507) Pulse Rate:  [60-73] 66 (02/23 0755) Resp:  [16-20] 16 (02/23 0755) BP: (131-185)/(60-91) 170/74 (02/23 0755) SpO2:  [98 %-100 %] 100 % (02/23 0755) Weight:  [59.4 kg] 59.4 kg (02/22 1132) Last BM Date : 01/18/22 General: Well-developed, thin chronically ill-appearing older African-American female in NAD, in good spirits today Heart:  Regular rate and rhythm; no murmurs Lungs: Respirations even and unlabored, lungs CTA bilaterally Abdomen:  Soft, mild rather generalized tenderness nondistended. Normal bowel sounds.  Long midline scar Extremities:  Without edema. Neurologic:  Alert and oriented,  grossly normal neurologically. Psych:  Cooperative. Normal mood and affect.  Intake/Output from previous day: 02/22 0701 - 02/23 0700 In: 253.8 [I.V.:253.8] Out: -  Intake/Output this shift: No intake/output data recorded.  Lab Results: Recent Labs    01/17/22 1157 01/18/22 0417  WBC 3.6* 4.2  HGB 8.7* 7.9*  HCT 26.3*  23.6*  PLT 148* 160   BMET Recent Labs    01/17/22 1157 01/18/22 0417  NA 137 136  K 4.2 4.2  CL 109 109  CO2 24 23  GLUCOSE 105* 120*  BUN 33* 29*  CREATININE 1.40* 1.50*  CALCIUM 7.7* 7.6*   LFT Recent Labs    01/18/22 0417  PROT 5.5*  ALBUMIN 2.6*  AST 14*  ALT 15  ALKPHOS 69  BILITOT 0.5   PT/INR No results for input(s): LABPROT, INR in the last 72 hours.  Studies/Results: CT Abdomen Pelvis Wo Contrast  Result Date: 01/17/2022 CLINICAL DATA:  History of Crohn's disease, presenting with abdominal pain, nausea, vomiting EXAM: CT ABDOMEN AND PELVIS WITHOUT CONTRAST TECHNIQUE: Multidetector CT imaging of the abdomen and pelvis was performed following the standard protocol without IV contrast. RADIATION DOSE REDUCTION: This exam was performed according to the departmental dose-optimization program which includes automated exposure control, adjustment of the mA and/or kV according to patient size and/or use of iterative reconstruction technique. COMPARISON:  CT abdomen/pelvis 01/13/2022 FINDINGS: Lower chest: The lung bases are clear. The imaged heart is unremarkable. Hepatobiliary: No focal liver lesion is seen, within the confines of noncontrast technique. The gallbladder is not identified, presumed surgically absent. Pneumobilia is again seen, likely reflecting previous sphincterotomy. Pancreas: Suboptimally assessed due to lack of intravenous contrast and paucity of intraabdominal fat. No definite abnormality is seen. Spleen: Unremarkable. Adrenals/Urinary Tract: No definite adrenal lesion is seen. The kidneys are mildly atrophic bilaterally. No definite focal lesions are seen, within  the confines of noncontrast technique. No stones are seen. There is no hydronephrosis or hydroureter. The bladder is unremarkable. Stomach/Bowel: The bowel is suboptimally assessed due to lack of intravenous or enteric contrast. The stomach wall appears thickened, though suboptimally assessed. There  is significant thickening of the colonic wall most notably involving the descending colon through the rectum, progressed since the prior study from 01/13/2022. Suture material is seen throughout the abdomen likely reflecting a history of bowel resection. Vascular/Lymphatic: There is mild calcified atherosclerotic plaque in the abdominal aorta. There is no evidence of aneurysm. A left femoral approach venous catheter is in place terminating at the inferior cavoatrial junction, unchanged. There is no definite pathologic lymphadenopathy in the abdomen or pelvis. Reproductive: The uterus is surgically absent. There is no adnexal mass. Other: There is mild stranding throughout the abdominal fat, likely reactive/inflammatory. There is no definite organized or drainable fluid collection. There is no definite free intraperitoneal air. Musculoskeletal: There is no acute osseous abnormality or aggressive osseous lesion. IMPRESSION: 1. Significant thickening of the colonic wall primarily involving the descending colon through the rectum, progressed since the study from 01/13/2022, likely reflecting colitis in the setting of Crohn's disease. There is also thickening of the stomach wall, which could reflect concomitant gastritis. 2. No definite evidence of organized or drainable fluid collection in the abdomen or pelvis. 3. Pneumobilia again seen which may be related to prior sphincterotomy. Electronically Signed   By: Valetta Mole M.D.   On: 01/17/2022 12:35       Assessment / Plan:    #77 next-year-old African-American female with very complicated past medical history in setting of long history of complicated Crohn's disease status post multiple small bowel resections with resultant short gut syndrome.  She has not had any evidence for active Crohn's disease in the past few years, very recent colonoscopy during her last admission with no evidence of active Crohn's in the colon or TI  C. difficile negative  She is on  chronic opioids, She has intermittent episodes of abdominal pain, followed by nausea and vomiting and had similar episode with brought her to the emergency room yesterday.  She has very little reserve at this point and if cannot keep down her meds orally gets into trouble with pain, probable component of withdrawal, and vomiting  She is back to her baseline today after hydration, and receiving IV antiemetics and IV opioids.  #2 chronic malnutrition secondary to above-on Gattex, and nocturnal TPN  #3 recurrent issues with bacteremia/sepsis secondary to line sepsis from need for for chronic PICC line #4 history of pancreatic insufficiency on Creon  Plan; advance to regular diet for lunch Hopefully pharmacy can dispense a dose of Gattex for her earlier this afternoon I think she can probably be discharged later today therefore she can get back on TPN at home tonight, and will be able to keep her outpatient appointment with pain management tomorrow  I will send a prescription for Zofran ODT 4 mg, and will also send a prescription for Phenergan suppositories to her pharmacy so that she can have something at home to take at onset of 1 of these episodes, this may help prevent repeated ER visits  She will return to [facility] for outpatient follow-up.       Principal Problem:   Exacerbation of Crohn's disease ([place]) Active Problems:   Essential hypertension   Short gut syndrome   GERD (gastroesophageal reflux disease)   Chronic pain syndrome   On total parenteral  nutrition (TPN)   Pancytopenia ([name])   Chronic kidney disease, stage 3b ([place])     LOS: 0 days   Amariz Flamenco  PA-C2/23/2023, 8:35 AM

## 2022-01-18 NOTE — Assessment & Plan Note (Addendum)
On TPN.  Continue on discharge

## 2022-01-18 NOTE — Assessment & Plan Note (Addendum)
Continue PPI from home

## 2022-01-18 NOTE — Progress Notes (Signed)
Transition of Care [facility]) Screening Note  Patient Details  Name: Caitlin Peters Date of Birth: October 12, 1961  Transition of Care [facility]) CM/SW Contact:    Sherie Don, LCSW Phone Number: 01/18/2022, 9:54 AM  Transition of Care Department [facility]) has reviewed patient and no TOC needs have been identified at this time. We will continue to monitor patient advancement through interdisciplinary progression rounds. If new patient transition needs arise, please place a TOC consult.

## 2022-01-19 LAB — CBC
HCT: 25 % — ABNORMAL LOW (ref 36.0–46.0)
Hemoglobin: 8.4 g/dL — ABNORMAL LOW (ref 12.0–15.0)
MCH: 30.9 pg (ref 26.0–34.0)
MCHC: 33.6 g/dL (ref 30.0–36.0)
MCV: 91.9 fL (ref 80.0–100.0)
Platelets: 137 10*3/uL — ABNORMAL LOW (ref 150–400)
RBC: 2.72 MIL/uL — ABNORMAL LOW (ref 3.87–5.11)
RDW: 14.7 % (ref 11.5–15.5)
WBC: 3.1 10*3/uL — ABNORMAL LOW (ref 4.0–10.5)
nRBC: 0 % (ref 0.0–0.2)

## 2022-01-19 LAB — GLUCOSE, CAPILLARY: Glucose-Capillary: 105 mg/dL — ABNORMAL HIGH (ref 70–99)

## 2022-01-19 LAB — PHOSPHORUS: Phosphorus: 2.5 mg/dL (ref 2.5–4.6)

## 2022-01-19 LAB — COMPREHENSIVE METABOLIC PANEL
ALT: 20 U/L (ref 0–44)
AST: 18 U/L (ref 15–41)
Albumin: 2.8 g/dL — ABNORMAL LOW (ref 3.5–5.0)
Alkaline Phosphatase: 67 U/L (ref 38–126)
Anion gap: 4 — ABNORMAL LOW (ref 5–15)
BUN: 31 mg/dL — ABNORMAL HIGH (ref 6–20)
CO2: 23 mmol/L (ref 22–32)
Calcium: 8.5 mg/dL — ABNORMAL LOW (ref 8.9–10.3)
Chloride: 111 mmol/L (ref 98–111)
Creatinine, Ser: 1.31 mg/dL — ABNORMAL HIGH (ref 0.44–1.00)
GFR, Estimated: 47 mL/min — ABNORMAL LOW (ref >60)
Glucose, Bld: 121 mg/dL — ABNORMAL HIGH (ref 70–99)
Potassium: 4.1 mmol/L (ref 3.5–5.1)
Sodium: 138 mmol/L (ref 135–145)
Total Bilirubin: 0.2 mg/dL — ABNORMAL LOW (ref 0.3–1.2)
Total Protein: 6.1 g/dL — ABNORMAL LOW (ref 6.5–8.1)

## 2022-01-19 LAB — TRIGLYCERIDES: Triglycerides: 48 mg/dL (ref <150)

## 2022-01-19 LAB — MAGNESIUM: Magnesium: 2.2 mg/dL (ref 1.7–2.4)

## 2022-01-19 NOTE — Progress Notes (Signed)
PHARMACY - TOTAL PARENTERAL NUTRITION CONSULT NOTE   Indication: Short gut syndrome.  Patient Measurements: Height: 5' 8"  (172.7 cm) Weight: 59.4 kg (130 lb 15.3 oz) IBW/kg (Calculated) : 63.9 TPN AdjBW (KG): 59.4 Body mass index is 19.91 kg/m. Usual Weight:   Assessment:  Pt on chronic TPN PTA   Glucose / Insulin: WNL, no hx of DM Electrolytes: WNl, CorrCa is 9.46.  Phos at low end at 2.5 Renal: Scr decreased to 1.3 Hepatic: LFTs WNL, Tbili low.  Albumin low at 2.8 Intake / Output; MIVF: IVF d/c.  I/O not recorded.  GI Imaging: 2/18 significant thickening of colonic wall seen  GI Surgeries / Procedures:   Central access: PTA  TPN start date: on TPN PTA   Nutritional Goals: pending Goal TPN rate is 1800 mL cycled over 12 hours to provide 90 g of protein and 1818 total kcals per day  RD Assessment: (2/23) Estimated Needs Total Energy Estimated Needs: 2000-2200 kcal Total Protein Estimated Needs: 100-115 grams Total Fluid Estimated Needs: >/= 2 L/day  Current Nutrition:  Regular diet  Plan:  Cyclic TPN  Start rate at 82 mL/hr for 1 hours Increase rate to 164 mL/hr for 10 hours. Decrease rate to 82 mL/hr for 1 hours, then stop. Electrolytes in TPN: Na 55mq/L, K 335m/L, Ca 38m338mL, Mg 5mE38m, and Phos 15 mmol/L. Cl:Ac 1:1 Add  trace elements to TPN daily Add multivitamin to TPN on MWF only ([employer] developerrently) Initiate Sensitive ACHS SSI and adjust as needed  DC MIVF after discussion with MD  Monitor TPN labs on Mon/Thurs   ChriGretta ArabrmD, BCPS Clinical Pharmacist [facility] main pharmacy [phone] 7:07 AM

## 2022-01-19 NOTE — Discharge Summary (Signed)
Physician Discharge Summary   Patient: Caitlin Peters MRN: [id number] DOB: Feb 25, 1961  Admit date:     01/17/2022  Discharge date: 01/19/22  Discharge Physician: Corrie Mckusick Emalie Mcwethy   PCP: [name], [name], MD   Recommendations at discharge:   Follow-up with your primary care physician in 1 week.   Follow-up with pain management as has been scheduled today.  Discharge Diagnoses: Principal Problem:   Nausea and vomiting Active Problems:   Essential hypertension   Short gut syndrome   GERD (gastroesophageal reflux disease)   Chronic pain syndrome   On total parenteral nutrition (TPN)   Chronic kidney disease, stage 3b ([name])   Exacerbation of Crohn's disease ([name])   Nausea & vomiting  Resolved Problems:   * No resolved hospital problems. *   Hospital Course: Caitlin Peters is a 61 y.o. female with medical history significant of acute pancreatitis, anasarca, mild osteonecrosis of the bone, cataract, choledocholithiasis, chronic pain syndrome, stage IIIb CKD, depression, GERD, iron deficiency anemia, multiple small bowel resection, short gut syndrome, malnutrition on TPN at bedtime, mass in the chest, osteoporosis, history of pancreatitis episodes, vitamin B12 deficiency presented to hospital with abdominal pain and subjective fever nausea and multiple episodes of vomiting.  Patient was seen in the ER for similar symptoms on 01/13/2022.  In the ED, patient had stable vitals.  Labs showed WBC at 3.6 platelet 148.  Albumin was 2.7.  LFTs were within normal limits. CT abdomen/pelvis without contrast shows significant thickening of the colonic wall primarily involving the descending colon through the rectum, which has progressed since the study done 4 days earlier with suspicions for Crohn's colitis.  There was also stomach wall thickening suspicious for gastritis.  Pneumobilia which may be related to her prior sphincterotomy.  There was no evidence of organized or drainable fluid  collection in the abdomen or pelvis. Patient received 1 L of Ringer lactate bolus hydromorphone Solu-Medrol Zofran and was admitted hospital for further evaluation and treatment.  GI was consulted as well.  Assessment and Plan: * Nausea and vomiting- (present on admission) Improved after hydration.  Continue pain control as outpatient.  Exacerbation of Crohn's disease ([place])- (present on admission) Seen by GI.  No indication for steroids at this time.  Pain control antiemetics, IV fluid hydration.  Has improved  Chronic kidney disease, stage 3b ([place])- (present on admission) Continue to monitor BMP as outpatient.  On total parenteral nutrition (TPN)- (present on admission) Long-term.  Taking food orally as well.  Continue TPN discharge  Chronic pain syndrome- (present on admission) On Dilaudid 4 mg 4 times daily at home.   Patient follows up with pain management as outpatient.  She does have an appointment today  GERD (gastroesophageal reflux disease)- (present on admission) Continue PPI from home  Short gut syndrome- (present on admission) On TPN.  Continue on discharge  Essential hypertension- (present on admission) Continue pain control,. On amlodipine and Coreg Lasix hydralazine and metoprolol at home.  .  Consultants: GI Procedures performed: None Disposition: Home Diet recommendation:  Discharge Diet Orders (From admission, onward)     Start     Ordered   01/19/22 0000  Diet general        01/19/22 0806           Regular diet  DISCHARGE MEDICATION: Allergies as of 01/19/2022       Reactions   Meperidine Hives   Other reaction(s): GI Upset Due to [place]   Hyoscyamine Hives, Swelling  Legs swelling Disorientation   Cefepime Other (See Comments)   Neurotoxicity occurring in setting of AKI. Ceftriaxone tolerated during same admit   Gabapentin Other (See Comments)   unknown   Lyrica [pregabalin] Other (See Comments)   Has chronic dehydration and made it  worse   Topamax [topiramate] Other (See Comments)   Has chronic dehydration and made it worse   Zosyn [piperacillin Sod-tazobactam So] Other (See Comments)   Patient reports it makes her vomit, her neck stiff, and her "heart feel funny" Affected kidneys   Fentanyl Rash   Pt is allergic to fentanyl patch related to the glue (gives her a rash) Pt states she is NOT allergic to fentanyl IV medicine   Morphine And Related Rash        Medication List     TAKE these medications    acetaminophen 650 MG CR tablet Commonly known as: TYLENOL Take 1,300 mg by mouth every 6 (six) hours.   amLODipine 10 MG tablet Commonly known as: NORVASC Take 1 tablet by mouth once daily   buPROPion ER 100 MG 12 hr tablet Commonly known as: WELLBUTRIN SR Take 2 tablets by mouth once daily What changed: how to take this   Calcium 500 MG tablet Take 500 mg by mouth daily.   carvedilol 25 MG tablet Commonly known as: COREG Take 1 tablet (25 mg total) by mouth 2 (two) times daily.   cholecalciferol 25 MCG (1000 UNIT) tablet Commonly known as: VITAMIN D3 Take 1,000 Units by mouth daily.   cycloSPORINE 0.05 % ophthalmic emulsion Commonly known as: RESTASIS Place 1 drop into both eyes 2 (two) times daily.   Dexilant 60 MG capsule Generic drug: dexlansoprazole Take 1 capsule by mouth once daily What changed: how much to take   diphenoxylate-atropine 2.5-0.025 MG tablet Commonly known as: LOMOTIL Take 1 tablet by mouth 4 (four) times daily.   DULoxetine 30 MG capsule Commonly known as: CYMBALTA Take 90 mg by mouth daily.   estradiol 2 MG tablet Commonly known as: ESTRACE Take 1 tablet by mouth once daily   famotidine 20 MG tablet Commonly known as: PEPCID Take 20 mg by mouth at bedtime.   furosemide 20 MG tablet Commonly known as: LASIX Take 20 mg by mouth as needed. swelling   Gattex 5 MG Kit Generic drug: Teduglutide (rDNA) Inject 1.5 mg into the skin daily.   hydrALAZINE 100  MG tablet Commonly known as: APRESOLINE Take 1 tablet (100 mg total) by mouth 3 (three) times daily.   HYDROmorphone 4 MG tablet Commonly known as: DILAUDID Take 4 mg by mouth 4 (four) times daily.   levETIRAcetam 500 MG tablet Commonly known as: KEPPRA Take 1 tablet (500 mg total) by mouth 2 (two) times daily.   lipase/protease/amylase 36000 UNITS Cpep capsule Commonly known as: Creon Take 1 capsule (36,000 Units total) by mouth See admin instructions. 36000 units with meals What changed: when to take this   loperamide 2 MG capsule Commonly known as: IMODIUM Take 4 mg by mouth 2 (two) times daily as needed for diarrhea or loose stools.   methadone 5 MG tablet Commonly known as: DOLOPHINE Take 1 tablet (5 mg total) by mouth 5 (five) times daily.   methocarbamol 500 MG tablet Commonly known as: Robaxin Take 1 tablet (500 mg total) by mouth in the morning and at bedtime.   metoprolol tartrate 100 MG tablet Commonly known as: LOPRESSOR Take 100 mg by mouth 2 (two) times daily.   MULTIVITAMIN ADULT  PO Take 1 tablet by mouth daily.   Narcan 4 MG/0.1ML Liqd nasal spray kit Generic drug: naloxone Place 1 spray into the nose once as needed (overdose).   Omega-3 1400 MG Caps Take 1,400 mg by mouth daily.   ondansetron 4 MG disintegrating tablet Commonly known as: ZOFRAN-ODT Dissolve under tongue every  6 hours PRN nausea and vomiting   ondansetron 4 MG tablet Commonly known as: ZOFRAN Take 4 mg by mouth every 8 (eight) hours as needed for nausea or vomiting.   polyvinyl alcohol 1.4 % ophthalmic solution Commonly known as: LIQUIFILM TEARS Place 1 drop into both eyes daily as needed for dry eyes.   PRESCRIPTION MEDICATION Inject 1 each into the vein daily. Home TPN . [facility] . 1 bag for 12 hours. [phone]   promethazine 25 MG tablet Commonly known as: PHENERGAN TAKE 1 TABLET BY MOUTH EVERY 6 HOURS AS NEEDED FOR NAUSEA FOR  VOMITING What changed: See the new instructions.   promethazine 25 MG suppository Commonly known as: PHENERGAN 1 per rectum every 6 to 8 hours PRN vomiting What changed: You were already taking a medication with the same name, and this prescription was added. Make sure you understand how and when to take each.   sodium chloride 0.9 % infusion Inject 1,000 mLs into the vein daily. For chronic dehydration   TRALEMENT IV Inject 1 mL into the vein See admin instructions. Used in TPN bag 4 times weekly   traZODone 100 MG tablet Commonly known as: DESYREL Take 200 mg by mouth at bedtime.   vitamin B-12 1000 MCG tablet Commonly known as: CYANOCOBALAMIN Take 1,000 mcg by mouth daily.         Subjective Physical patient was seen and examined patient feels better.  Wishes to go home.  Tolerating p.o.  Discharge Exam: Filed Weights   01/17/22 1132  Weight: 59.4 kg   Vitals with BMI 01/19/2022 01/18/2022 01/18/2022  Height - - -  Weight - - -  BMI - - -  Systolic 322 025 427  Diastolic 93 78 71  Pulse 72 - 75    General:  Average built, not in obvious distress HENT:   No scleral pallor or icterus noted. Oral mucosa is moist.  Chest:  Clear breath sounds.  Diminished breath sounds bilaterally. No crackles or wheezes.  CVS: S1 &S2 heard. No murmur.  Regular rate and rhythm. Abdomen: Soft, nontender, nondistended.  Bowel sounds are heard.   Extremities: No cyanosis, clubbing or edema.  Peripheral pulses are palpable.  Left groin with central catheter. Psych: Alert, awake and oriented, normal mood CNS:  No cranial nerve deficits.  Power equal in all extremities.   Skin: Warm and dry.  No rashes noted.   Condition at discharge: good  The results of significant diagnostics from this hospitalization (including imaging, microbiology, ancillary and laboratory) are listed below for reference.   Imaging Studies: CT Abdomen Pelvis Wo Contrast  Result Date: 01/17/2022 CLINICAL DATA:   History of Crohn's disease, presenting with abdominal pain, nausea, vomiting EXAM: CT ABDOMEN AND PELVIS WITHOUT CONTRAST TECHNIQUE: Multidetector CT imaging of the abdomen and pelvis was performed following the standard protocol without IV contrast. RADIATION DOSE REDUCTION: This exam was performed according to the departmental dose-optimization program which includes automated exposure control, adjustment of the mA and/or kV according to patient size and/or use of iterative reconstruction technique. COMPARISON:  CT abdomen/pelvis 01/13/2022 FINDINGS: Lower chest: The lung bases are clear.  The imaged heart is unremarkable. Hepatobiliary: No focal liver lesion is seen, within the confines of noncontrast technique. The gallbladder is not identified, presumed surgically absent. Pneumobilia is again seen, likely reflecting previous sphincterotomy. Pancreas: Suboptimally assessed due to lack of intravenous contrast and paucity of intraabdominal fat. No definite abnormality is seen. Spleen: Unremarkable. Adrenals/Urinary Tract: No definite adrenal lesion is seen. The kidneys are mildly atrophic bilaterally. No definite focal lesions are seen, within the confines of noncontrast technique. No stones are seen. There is no hydronephrosis or hydroureter. The bladder is unremarkable. Stomach/Bowel: The bowel is suboptimally assessed due to lack of intravenous or enteric contrast. The stomach wall appears thickened, though suboptimally assessed. There is significant thickening of the colonic wall most notably involving the descending colon through the rectum, progressed since the prior study from 01/13/2022. Suture material is seen throughout the abdomen likely reflecting a history of bowel resection. Vascular/Lymphatic: There is mild calcified atherosclerotic plaque in the abdominal aorta. There is no evidence of aneurysm. A left femoral approach venous catheter is in place terminating at the inferior cavoatrial junction,  unchanged. There is no definite pathologic lymphadenopathy in the abdomen or pelvis. Reproductive: The uterus is surgically absent. There is no adnexal mass. Other: There is mild stranding throughout the abdominal fat, likely reactive/inflammatory. There is no definite organized or drainable fluid collection. There is no definite free intraperitoneal air. Musculoskeletal: There is no acute osseous abnormality or aggressive osseous lesion. IMPRESSION: 1. Significant thickening of the colonic wall primarily involving the descending colon through the rectum, progressed since the study from 01/13/2022, likely reflecting colitis in the setting of Crohn's disease. There is also thickening of the stomach wall, which could reflect concomitant gastritis. 2. No definite evidence of organized or drainable fluid collection in the abdomen or pelvis. 3. Pneumobilia again seen which may be related to prior sphincterotomy. Electronically Signed   By: Valetta Mole M.D.   On: 01/17/2022 12:35   CT ABDOMEN PELVIS WO CONTRAST  Result Date: 01/13/2022 CLINICAL DATA:  Acute on chronic abdominal pain. Bowel obstruction suspected. History of Crohn's disease. EXAM: CT ABDOMEN AND PELVIS WITHOUT CONTRAST TECHNIQUE: Multidetector CT imaging of the abdomen and pelvis was performed following the standard protocol without IV contrast. RADIATION DOSE REDUCTION: This exam was performed according to the departmental dose-optimization program which includes automated exposure control, adjustment of the mA and/or kV according to patient size and/or use of iterative reconstruction technique. COMPARISON:  Abdominopelvic CT 11/27/2021 FINDINGS: Lower chest: Mild cardiomegaly. No acute airspace disease or pleural effusion. Hepatobiliary: Previous pneumobilia has near completely resolved. High-density material within the biliary tree is also no longer seen. There may be mild periportal edema. No focal liver lesion is seen. Gallbladder is not  visualized, presumably surgically absent. Pancreas: No inflammatory change. More detailed assessment is limited on this unenhanced exam as well as paucity of intra-abdominal fat. Spleen: Normal in size without focal abnormality. Adrenals/Urinary Tract: The adrenal glands are difficult to delineate. There is no hydronephrosis or renal calculi. No perinephric edema. Unremarkable urinary bladder. Stomach/Bowel: Bowel assessment is limited in the absence of contrast and paucity of intra-abdominal fat. Multiple enteric sutures are present. There is wall thickening of the proximal sigmoid as well as rectosigmoid colon. Similar findings were seen on prior CT. Suspected progression of colonic wall thickening to now involve the descending colon. Small bowel assessment is particularly limited, scattered fluid-filled small bowel loops in a nonobstructive pattern. No evidence of bowel obstruction. Vascular/Lymphatic: Left femoral venous catheter extends  into the IVC terminating at the atrial IVC junction. Aortic atherosclerosis without aortic aneurysm. Limited assessment for adenopathy on this unenhanced exam, no obvious bulky enlarged lymph nodes. Reproductive: Hysterectomy.  No adnexal mass. Other: Generalized edema of the mesenteric and intra-abdominal fat. Otherwise paucity of subcutaneous body fat. No ascites, free air, evident focal fluid collection. Musculoskeletal: Avascular necrosis of left femoral head. Bone island in the left iliac bone. No acute osseous abnormalities. IMPRESSION: 1. Progressive colonic wall thickening from prior CT. Detailed bowel assessment is limited. Fluid-filled small bowel in a nonobstructive pattern. Multiple enteric sutures. 2. Previous pneumobilia has near completely resolved. High-density material within the biliary tree is also no longer seen. 3. Left femoral venous catheter extends into the IVC terminating at the atrial IVC junction. 4. Avascular necrosis of left femoral head. Aortic  Atherosclerosis (ICD10-I70.0). Electronically Signed   By: Keith Rake M.D.   On: 01/13/2022 21:03    Microbiology: Results for orders placed or performed during the hospital encounter of 01/17/22  Resp Panel by RT-PCR (Flu A&B, Covid) Nasopharyngeal Swab     Status: None   Collection Time: 01/17/22  1:06 PM   Specimen: Nasopharyngeal Swab; Nasopharyngeal(NP) swabs in vial transport medium  Result Value Ref Range Status   SARS Coronavirus 2 by RT PCR NEGATIVE NEGATIVE Final    Comment: (NOTE) SARS-CoV-2 target nucleic acids are NOT DETECTED.  The SARS-CoV-2 RNA is generally detectable in upper respiratory specimens during the acute phase of infection. The lowest concentration of SARS-CoV-2 viral copies this assay can detect is 138 copies/mL. A negative result does not preclude SARS-Cov-2 infection and should not be used as the sole basis for treatment or other patient management decisions. A negative result may occur with  improper specimen collection/handling, submission of specimen other than nasopharyngeal swab, presence of viral mutation(s) within the areas targeted by this assay, and inadequate number of viral copies(<138 copies/mL). A negative result must be combined with clinical observations, patient history, and epidemiological information. The expected result is Negative.  Fact Sheet for Patients:  EntrepreneurPulse.com.au  Fact Sheet for Healthcare Providers:  IncredibleEmployment.be  This test is no t yet approved or cleared by the [place] FDA and  has been authorized for detection and/or diagnosis of SARS-CoV-2 by FDA under an Emergency Use Authorization (EUA). This EUA will remain  in effect (meaning this test can be used) for the duration of the COVID-19 declaration under Section 564(b)(1) of the Act, 21 U.S.C.section 360bbb-3(b)(1), unless the authorization is terminated  or revoked sooner.       Influenza A by PCR  NEGATIVE NEGATIVE Final   Influenza B by PCR NEGATIVE NEGATIVE Final    Comment: (NOTE) The Xpert Xpress SARS-CoV-2/FLU/RSV plus assay is intended as an aid in the diagnosis of influenza from Nasopharyngeal swab specimens and should not be used as a sole basis for treatment. Nasal washings and aspirates are unacceptable for Xpert Xpress SARS-CoV-2/FLU/RSV testing.  Fact Sheet for Patients: EntrepreneurPulse.com.au  Fact Sheet for Healthcare Providers: IncredibleEmployment.be  This test is not yet approved or cleared by the [place] FDA and has been authorized for detection and/or diagnosis of SARS-CoV-2 by FDA under an Emergency Use Authorization (EUA). This EUA will remain in effect (meaning this test can be used) for the duration of the COVID-19 declaration under Section 564(b)(1) of the Act, 21 U.S.C. section 360bbb-3(b)(1), unless the authorization is terminated or revoked.  Performed at [facility], [place] [address], [place], [place] [address]  C Difficile Quick Screen w PCR reflex     Status: None   Collection Time: 01/18/22  5:00 AM   Specimen: STOOL  Result Value Ref Range Status   C Diff antigen NEGATIVE NEGATIVE Final   C Diff toxin NEGATIVE NEGATIVE Final   C Diff interpretation No C. difficile detected.  Final    Comment: Performed at [facility], [place] [address], [place], [place] [address]    Labs: CBC: Recent Labs  Lab 01/13/22 1800 01/17/22 1157 01/18/22 0417 01/19/22 0500  WBC 3.8* 3.6* 4.2 3.1*  NEUTROABS 1.9 1.9  --   --   HGB 7.8* 8.7* 7.9* 8.4*  HCT 24.0* 26.3* 23.6* 25.0*  MCV 92.7 92.0 91.5 91.9  PLT 147* 148* 160 715*   Basic Metabolic Panel: Recent Labs  Lab 01/13/22 1800 01/17/22 1157 01/18/22 0417 01/19/22 0500  NA 138 137 136 138  K 3.8 4.2 4.2 4.1  CL 108 109 109 111  CO2 _0 GLUCOSE 91 105* 120* 121*  BUN 41* 33* 29* 31*  CREATININE 1.67*  1.40* 1.50* 1.31*  CALCIUM 8.5* 7.7* 7.6* 8.5*  MG  --   --   --  2.2  PHOS  --   --   --  2.5   Liver Function Tests: Recent Labs  Lab 01/13/22 1800 01/17/22 1157 01/18/22 0417 01/19/22 0500  AST 17 14* 14* 18  ALT _1 ALKPHOS 82 76 69 67  BILITOT 0.4 0.4 0.5 0.2*  PROT 6.4* 6.1* 5.5* 6.1*  ALBUMIN 2.9* 2.7* 2.6* 2.8*   CBG: Recent Labs  Lab 01/18/22 1604 01/18/22 2022 01/19/22 0736  GLUCAP 96 131* 105*    Discharge time spent: greater than 30 minutes.  Signed: Flora Lipps, MD [facility] 01/19/2022

## 2022-01-22 ENCOUNTER — Telehealth: Payer: Self-pay

## 2022-01-22 LAB — HEMOGLOBIN A1C
Hgb A1c MFr Bld: 4.7 % — ABNORMAL LOW (ref 4.8–5.6)
Mean Plasma Glucose: 88 mg/dL

## 2022-01-22 NOTE — Telephone Encounter (Addendum)
Transition Care Management Unsuccessful Follow-up Telephone Call  Date of discharge and from where:  [place] 01-19-22 Dx: N/V  Attempts:  1st Attempt  Reason for unsuccessful TCM follow-up call:  Unable to leave message  Transition Care Management Unsuccessful Follow-up Telephone Call  Date of discharge and from where:  [place] 01-19-22 Dx: N/V  Attempts:  2nd Attempt  Reason for unsuccessful TCM follow-up call:  Unable to leave message

## 2022-01-23 ENCOUNTER — Ambulatory Visit (INDEPENDENT_AMBULATORY_CARE_PROVIDER_SITE_OTHER): Payer: Medicare Other | Admitting: Internal Medicine

## 2022-01-23 VITALS — BP 150/67 | HR 67 | Temp 99.1°F | Wt 139.2 lb

## 2022-01-23 DIAGNOSIS — Z780 Asymptomatic menopausal state: Secondary | ICD-10-CM | POA: Diagnosis not present

## 2022-01-23 DIAGNOSIS — R112 Nausea with vomiting, unspecified: Secondary | ICD-10-CM | POA: Diagnosis not present

## 2022-01-23 DIAGNOSIS — F339 Major depressive disorder, recurrent, unspecified: Secondary | ICD-10-CM

## 2022-01-23 DIAGNOSIS — Z09 Encounter for follow-up examination after completed treatment for conditions other than malignant neoplasm: Secondary | ICD-10-CM

## 2022-01-23 MED ORDER — ESTRADIOL 2 MG PO TABS: 2.0000 mg | ORAL_TABLET | Freq: Every day | ORAL | 0 refills | Status: AC

## 2022-01-23 MED ORDER — BUPROPION HCL ER (SR) 100 MG PO TB12
200.0000 mg | ORAL_TABLET | Freq: Every day | ORAL | 2 refills | Status: AC
Start: 2022-01-23 — End: ?

## 2022-01-23 NOTE — Progress Notes (Signed)
Hospital follow-up visit     CC/Reason for Visit: Hospitalization follow-up  HPI: Caitlin Peters is a 61 y.o. female who is coming in today for the above mentioned reasons, specifically transitional care services face-to-face visit.    Dates hospitalized: 01/17/2022-01/19/2022 Days since discharge from hospital: 4 Patient was discharged from the hospital to: Home Reason for admission to hospital: Intractable nausea and vomiting Date of interactive phone contact with patient and/or caregiver: 01/22/2022  I have reviewed in detail patient's discharge summary plus pertinent specific notes, labs, and images from the hospitalization.  Yes  She was admitted due to uncontrolled nausea and vomiting, this is in a patient with multiple hospitalizations for Crohn's disease with short gut syndrome and multiple line infections.  No fever this admission, blood cultures were negative.  CT scan showed thickening of colonic wall primarily involving the descending colon through the rectum suspicious for Crohn's colitis.  There was no drainable fluid collection.  GI was consulted.  Nausea improved after hydration, she is being followed by pain management specialist.  Per GI no steroids were indicated.  She tells me today that she will be moving back to [place] with her daughter as she has more family available to help with her care.  She will be moving this week.  Medication reconciliation was done today and patient is taking meds as recommended by discharging hospitalist/specialist.  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; 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; jejunal to transverse colon anastomosis Treated at [facility] lengthening to 165cm Dr Alene Mires, [facility]   Vitamin B12 deficiency     Past Surgical History:  Procedure Laterality Date   ABDOMINAL ADHESION SURGERY  01/22/2018   APPENDECTOMY  1989   BILIARY DILATION  11/26/2019   Procedure: BILIARY DILATION;  Surgeon: Jackquline Denmark, MD;  Location: [facility];  Service: Endoscopy;;   BILIARY DILATION  03/08/2020   Procedure: BILIARY DILATION;  Surgeon: Irving Copas., MD;  Location: [facility];  Service: Gastroenterology;;   BIOPSY  03/08/2020   Procedure: BIOPSY;  Surgeon: Irving Copas., MD;  Location: [facility]OS[facility];  Service: Gastroenterology;;   CHEST WALL RESECTION     right thoracotomy,resection of chest mass with anterior rib and reconstruction using prosthetic mesh and video arthroscopy   CHOLECYSTECTOMY  01/22/2018   COLONOSCOPY  2019   COLONOSCOPY WITH PROPOFOL N/A 11/29/2021   Procedure: COLONOSCOPY WITH PROPOFOL;  Surgeon: Yetta Flock, MD;  Location: [facility];  Service: Gastroenterology;  Laterality: N/A;   ENTEROSTOMY CLOSURE  04/1999   ERCP N/A 11/26/2019   Procedure: ENDOSCOPIC RETROGRADE CHOLANGIOPANCREATOGRAPHY (ERCP);  Surgeon: Jackquline Denmark, MD;  Location: [facility];  Service: Endoscopy;  Laterality: N/A;   ERCP N/A 03/08/2020   Procedure: ENDOSCOPIC RETROGRADE CHOLANGIOPANCREATOGRAPHY (  ERCP);  Surgeon: Mansouraty, Telford Nab., MD;  Location: [place];  Service: Gastroenterology;  Laterality: N/A;   ESOPHAGOGASTRODUODENOSCOPY N/A 03/08/2020   Procedure: ESOPHAGOGASTRODUODENOSCOPY (EGD);  Surgeon: Irving Copas., MD;  Location: [place];  Service: Gastroenterology;  Laterality: N/A;   EUS N/A 03/08/2020   Procedure: UPPER ENDOSCOPIC ULTRASOUND (EUS) LINEAR;  Surgeon: Irving Copas., MD;  Location: [place];  Service: Gastroenterology;  Laterality: N/A;   ILEOCECETOMY  03/1999   ileocolon resection with abdominal stoma   ILEOSTOMY CLOSURE  2001   IR FLUORO GUIDE CV LINE LEFT  01/07/2020   IR FLUORO GUIDE CV LINE LEFT  03/09/2020   IR FLUORO GUIDE CV LINE LEFT  05/09/2020   IR FLUORO GUIDE CV LINE LEFT  07/20/2020   IR FLUORO GUIDE CV LINE RIGHT  08/05/2020   IR FLUORO GUIDE CV LINE RIGHT  04/03/2021   IR PERC TUN PERIT CATH WO PORT S&I /IMAG  12/05/2021   IR PTA VENOUS EXCEPT DIALYSIS CIRCUIT  01/07/2020   IR REMOVAL TUN CV CATH W/O FL  08/05/2020   IR REMOVAL TUN CV CATH W/O FL  07/11/2021   IR REMOVAL TUN CV CATH W/O FL  11/29/2021   IR US GUIDE VASC ACCESS LEFT     x 2 06/17/19 and 09/14/2019   IR US GUIDE VASC ACCESS LEFT  12/05/2021   IR US GUIDE VASC ACCESS RIGHT  08/05/2020   KNEE SURGERY     right knee    LAPAROSCOPIC SMALL BOWEL RESECTION  2009   2000-2009.  SB resections for Crohns Disease - now with Short gut   OMENTECTOMY  01/22/2018   PARTIAL HYSTERECTOMY  1984   with LSO   REMOVAL OF STONES  11/26/2019   Procedure: REMOVAL OF STONES;  Surgeon: Jackquline Denmark, MD;  Location: [facility];  Service: Endoscopy;;   REMOVAL OF STONES  03/08/2020   Procedure: REMOVAL OF STONES;  Surgeon: Irving Copas., MD;  Location: [facility] ENDOSCOPY;  Service: Gastroenterology;;   SALPINGOOPHORECTOMY Left 1984   SALPINGOOPHORECTOMY Right 1990   SERIAL TRANSVERSE ENTEROPLASTY (STEP) - SMALL BOWEL LENGTHENING  01/22/2018   Dr Alene Mires, [facility] - SB length from 120 to 165cm    SMALL INTESTINE SURGERY  2002   SMALL INTESTINE SURGERY  2003   SPHINCTEROTOMY  11/26/2019   Procedure: SPHINCTEROTOMY;  Surgeon: Jackquline Denmark, MD;  Location: [facility];  Service: Endoscopy;;   TOTAL ABDOMINAL HYSTERECTOMY   1990   with RSO   UPPER GASTROINTESTINAL ENDOSCOPY      Social History:  reports that she has quit smoking. Her smoking use included cigarettes. She has a 17.50 pack-year smoking history. She has never used smokeless tobacco. She reports that she does not currently use alcohol. She reports that she does not use drugs.  Allergies: Allergies  Allergen Reactions   Meperidine Hives    Other reaction(s): GI Upset Due to [place]    Hyoscyamine Hives and Swelling    Legs swelling Disorientation   Cefepime Other (See Comments)    Neurotoxicity occurring in setting of AKI. Ceftriaxone tolerated during same admit   Gabapentin Other (See Comments)    unknown   Lyrica [Pregabalin] Other (See Comments)    Has chronic dehydration and made it worse   Topamax [Topiramate] Other (See Comments)    Has chronic dehydration and made it worse   Zosyn [Piperacillin Sod-Tazobactam So] Other (See Comments)    Patient reports it makes her vomit, her neck stiff, and  her "heart feel funny" Affected kidneys   Fentanyl Rash    Pt is allergic to fentanyl patch related to the glue (gives her a rash) Pt states she is NOT allergic to fentanyl IV medicine   Morphine And Related Rash    Family History:  Family History  Problem Relation Age of Onset   Seizures Mother    Glaucoma Mother    CAD Father    Heart disease Father    Hypertension Father    Breast cancer Sister    Multiple sclerosis Sister    Diabetes Sister    Lupus Sister    Colon cancer Other    Crohn's disease Other      Current Outpatient Medications:    acetaminophen (TYLENOL) 650 MG CR tablet, Take 1,300 mg by mouth every 6 (six) hours., Disp: , Rfl:    amLODipine (NORVASC) 10 MG tablet, Take 1 tablet by mouth once daily (Patient taking differently: Take 10 mg by mouth daily.), Disp: 90 tablet, Rfl: 0   Calcium 500 MG tablet, Take 500 mg by mouth daily., Disp: , Rfl:    carvedilol (COREG) 25 MG tablet, Take 1 tablet (25 mg total) by  mouth 2 (two) times daily., Disp: 180 tablet, Rfl: 3   cholecalciferol (VITAMIN D3) 25 MCG (1000 UT) tablet, Take 1,000 Units by mouth daily. , Disp: , Rfl:    cycloSPORINE (RESTASIS) 0.05 % ophthalmic emulsion, Place 1 drop into both eyes 2 (two) times daily., Disp: , Rfl:    DEXILANT 60 MG capsule, Take 1 capsule by mouth once daily (Patient taking differently: Take 60 mg by mouth daily.), Disp: 90 capsule, Rfl: 0   diphenoxylate-atropine (LOMOTIL) 2.5-0.025 MG tablet, Take 1 tablet by mouth 4 (four) times daily., Disp: , Rfl:    DULoxetine (CYMBALTA) 30 MG capsule, Take 90 mg by mouth daily., Disp: , Rfl:    famotidine (PEPCID) 20 MG tablet, Take 20 mg by mouth at bedtime., Disp: , Rfl:    furosemide (LASIX) 20 MG tablet, Take 20 mg by mouth as needed. swelling, Disp: , Rfl:    GATTEX 5 MG KIT, Inject 1.5 mg into the skin daily., Disp: , Rfl:    hydrALAZINE (APRESOLINE) 100 MG tablet, Take 1 tablet (100 mg total) by mouth 3 (three) times daily., Disp: 90 tablet, Rfl: 1   HYDROmorphone (DILAUDID) 4 MG tablet, Take 4 mg by mouth 4 (four) times daily., Disp: , Rfl:    levETIRAcetam (KEPPRA) 500 MG tablet, Take 1 tablet (500 mg total) by mouth 2 (two) times daily., Disp: 60 tablet, Rfl: 11   lipase/protease/amylase (CREON) 36000 UNITS CPEP capsule, Take 1 capsule (36,000 Units total) by mouth See admin instructions. 36000 units with meals (Patient taking differently: Take 36,000 Units by mouth 3 (three) times daily with meals. 36000 units with meals), Disp: , Rfl:    loperamide (IMODIUM) 2 MG capsule, Take 4 mg by mouth 2 (two) times daily as needed for diarrhea or loose stools., Disp: , Rfl:    methadone (DOLOPHINE) 5 MG tablet, Take 1 tablet (5 mg total) by mouth 5 (five) times daily., Disp: 30 tablet, Rfl: 0   methocarbamol (ROBAXIN) 500 MG tablet, Take 1 tablet (500 mg total) by mouth in the morning and at bedtime., Disp: 60 tablet, Rfl: 1   metoprolol tartrate (LOPRESSOR) 100 MG tablet, Take 100  mg by mouth 2 (two) times daily., Disp: , Rfl:    Multiple Vitamins-Minerals (MULTIVITAMIN ADULT PO), Take 1 tablet by mouth  daily., Disp: , Rfl:    NARCAN 4 MG/0.1ML LIQD nasal spray kit, Place 1 spray into the nose once as needed (overdose)., Disp: , Rfl:    Omega-3 1400 MG CAPS, Take 1,400 mg by mouth daily., Disp: , Rfl:    ondansetron (ZOFRAN) 4 MG tablet, Take 4 mg by mouth every 8 (eight) hours as needed for nausea or vomiting., Disp: , Rfl:    ondansetron (ZOFRAN-ODT) 4 MG disintegrating tablet, Dissolve under tongue every  6 hours PRN nausea and vomiting, Disp: 40 tablet, Rfl: 1   polyvinyl alcohol (LIQUIFILM TEARS) 1.4 % ophthalmic solution, Place 1 drop into both eyes daily as needed for dry eyes., Disp: , Rfl:    PRESCRIPTION MEDICATION, Inject 1 each into the vein daily. Home TPN . [facility] . 1 bag for 12 hours. [phone], Disp: , Rfl:    promethazine (PHENERGAN) 25 MG suppository, 1 per rectum every 6 to 8 hours PRN vomiting, Disp: 12 each, Rfl: 1   promethazine (PHENERGAN) 25 MG tablet, TAKE 1 TABLET BY MOUTH EVERY 6 HOURS AS NEEDED FOR NAUSEA FOR VOMITING (Patient taking differently: Take 25 mg by mouth every 6 (six) hours as needed for nausea or vomiting.), Disp: 90 tablet, Rfl: 0   sodium chloride 0.9 % infusion, Inject 1,000 mLs into the vein daily. For chronic dehydration, Disp: , Rfl:    Trace Minerals Cu-Mn-Se-Zn (TRALEMENT IV), Inject 1 mL into the vein See admin instructions. Used in TPN bag 4 times weekly, Disp: , Rfl:    traZODone (DESYREL) 100 MG tablet, Take 200 mg by mouth at bedtime., Disp: , Rfl:    vitamin B-12 (CYANOCOBALAMIN) 1000 MCG tablet, Take 1,000 mcg by mouth daily. , Disp: , Rfl:    buPROPion ER (WELLBUTRIN SR) 100 MG 12 hr tablet, Take 2 tablets (200 mg total) by mouth daily., Disp: 60 tablet, Rfl: 2   estradiol (ESTRACE) 2 MG tablet, Take 1 tablet (2 mg total) by mouth daily., Disp: 90 tablet, Rfl: 0  Review of Systems:   Constitutional: Denies fever, chills, diaphoresis, appetite change. HEENT: Denies photophobia, eye pain, redness, hearing loss, ear pain, congestion, sore throat, rhinorrhea, sneezing, mouth sores, trouble swallowing, neck pain, neck stiffness and tinnitus.   Respiratory: Denies SOB, DOE, cough, chest tightness,  and wheezing.   Cardiovascular: Denies chest pain, palpitations and leg swelling.  Gastrointestinal: Denies  blood in stool. Genitourinary: Denies dysuria, urgency, frequency, hematuria, flank pain and difficulty urinating.  Endocrine: Denies: hot or cold intolerance, sweats, changes in hair or nails, polyuria, polydipsia. Musculoskeletal: Denies myalgias, back pain, joint swelling, arthralgias and gait problem.  Skin: Denies pallor, rash and wound.  Neurological: Denies dizziness, seizures, syncope, weakness, light-headedness, numbness and headaches.  Hematological: Denies adenopathy. Easy bruising, personal or family bleeding history  Psychiatric/Behavioral: Denies suicidal ideation, mood changes, confusion, nervousness, sleep disturbance and agitation    Physical Exam: Vitals:   01/23/22 1303  BP: (!) 150/67  Pulse: 67  Temp: 99.1 F (37.3 C)  TempSrc: Oral  SpO2: 98%  Weight: 139 lb 3.2 oz (63.1 kg)    Body mass index is 21.17 kg/m.   Constitutional: NAD, calm, comfortable Eyes: PERRL, lids and conjunctivae normal ENMT: Mucous membranes are moist.  Respiratory: clear to auscultation bilaterally, no wheezing, no crackles. Normal respiratory effort. No accessory muscle use.  Cardiovascular: Regular rate and rhythm, no murmurs / rubs / gallops. No extremity edema.  Psychiatric: Normal judgment and insight. Alert and oriented x  3. Normal mood.    Impression and Plan:  Hospital discharge follow-up  Intractable nausea and vomiting  Depression, recurrent ([place]) - Plan: buPROPion ER (WELLBUTRIN SR) 100 MG 12 hr tablet  Postmenopausal - Plan: estradiol (ESTRACE) 2  MG tablet  -Hospital charts have been reviewed in detail. -She has improved since discharge, no further pain or nausea above baseline. -As she will be moving we have discussed things that she needs to have in place prior to: including home health for her chronic TPN needs, she needs to secure PCP, pain management and GI care in [place].  We have discussed medication refills which I have provided today to last her until she can establish care.  Unfortunately I am unsure about her pain regimen, her pain management physician was only able to provide 1 month refills.  I will make medical records available for her to pick up to transfer to her new physician.  All questions have been answered.         [name], MD    Medical decision making of moderate complexity was utilized today.      [name], MD [place] [facility]

## 2022-01-24 ENCOUNTER — Other Ambulatory Visit: Payer: Self-pay | Admitting: Internal Medicine

## 2022-01-24 DIAGNOSIS — Z780 Asymptomatic menopausal state: Secondary | ICD-10-CM

## 2022-01-24 DIAGNOSIS — K219 Gastro-esophageal reflux disease without esophagitis: Secondary | ICD-10-CM

## 2022-01-24 DIAGNOSIS — F339 Major depressive disorder, recurrent, unspecified: Secondary | ICD-10-CM

## 2022-02-01 ENCOUNTER — Telehealth: Payer: Self-pay | Admitting: Pharmacist

## 2022-02-01 NOTE — Chronic Care Management (AMB) (Signed)
A user error has taken place: encounter opened in error, closed for administrative reasons.

## 2022-02-22 ENCOUNTER — Ambulatory Visit: Payer: Medicare Other | Admitting: Internal Medicine

## 2022-02-26 ENCOUNTER — Other Ambulatory Visit (HOSPITAL_BASED_OUTPATIENT_CLINIC_OR_DEPARTMENT_OTHER): Payer: Self-pay

## 2022-03-19 ENCOUNTER — Ambulatory Visit: Payer: Medicare Other | Admitting: Internal Medicine

## 2022-03-19 ENCOUNTER — Telehealth: Payer: Self-pay

## 2022-03-19 NOTE — Telephone Encounter (Signed)
Called patient to reschedule missed appointment. No answer. Left HIPAA-compliant voicemail requesting call back.  ? ?Binnie Kand, RN  ?

## 2022-03-23 ENCOUNTER — Ambulatory Visit: Payer: Medicare Other | Admitting: Neurology

## 2022-04-30 ENCOUNTER — Telehealth: Payer: Self-pay

## 2022-04-30 NOTE — Telephone Encounter (Signed)
Attempted to do TCM call - informed by daughter that pt now lives in [place], [place] & Dr Jerilee Hoh is no longer her provider. Provider removed from care team list.

## 2022-06-20 ENCOUNTER — Ambulatory Visit: Payer: Medicare Other | Admitting: Internal Medicine

## 2022-09-20 LAB — ANTIFUNGAL AST 9 DRUG PANEL

## 2022-10-12 IMAGING — MR MR THORACIC SPINE WO/W CM
8 of 9 series · 32 of 48 positions shown · IV contrast (gadavist)
Comparison: None.

CLINICAL DATA: Mid back pain, abnormal CT

EXAM:
MRI THORACIC WITHOUT AND WITH CONTRAST
TECHNIQUE: Multiplanar and multiecho pulse sequences of the thoracic spine were
obtained without and with intravenous contrast.
CONTRAST:  6mL GADAVIST GADOBUTROL 1 MMOL/ML IV SOLN

[Series 16: T1 · sagittal · 4.0mm · 1.72mm/px · 1 of 5 slices shown (1 of 3)]
[im 1/5]
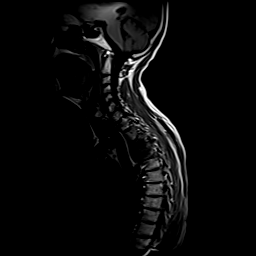

[Series 17: STIR · sagittal · 3.0mm · 1.00mm/px · 2 of 15 slices shown]
[im 1/15]
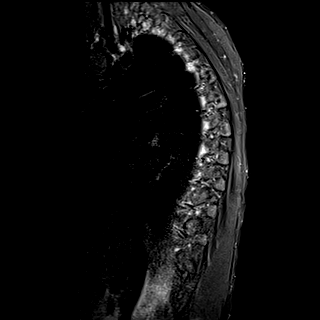
[im 15/15]
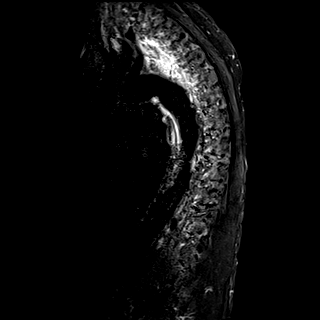

[Series 18: T1 · sagittal · 3.0mm · 1.00mm/px · 3 of 15 slices shown (2 of 3)]
[im 1/15]
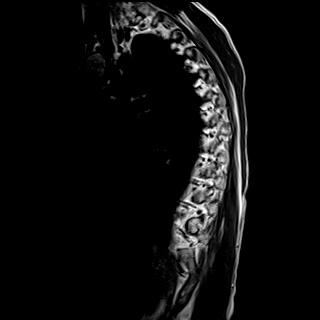
[im 8/15]
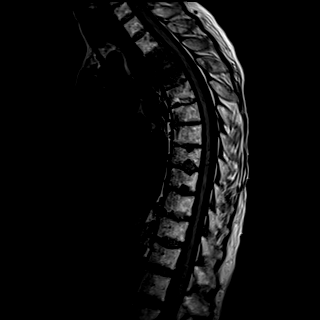
[im 15/15]
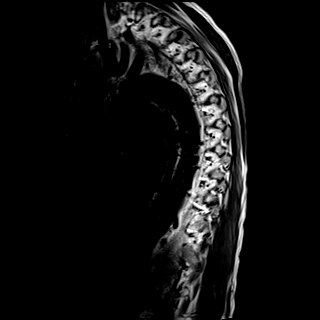

[Series 19: T2 · axial · 4.0mm · 0.78mm/px · z∈[-233,-28]mm · 9 of 43 slices shown]
[im 1/43]
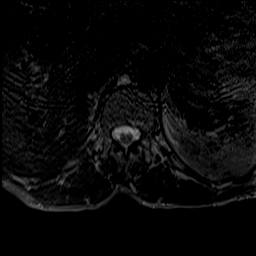
[im 6/43]
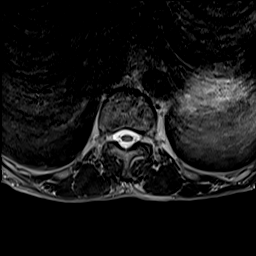
[im 11/43]
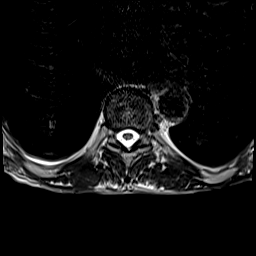
[im 16/43]
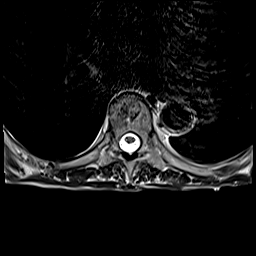
[im 22/43]
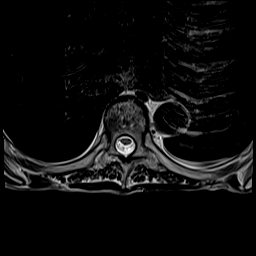
[im 27/43]
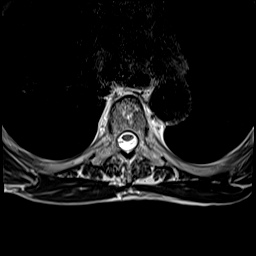
[im 32/43]
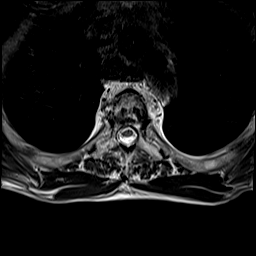
[im 37/43]
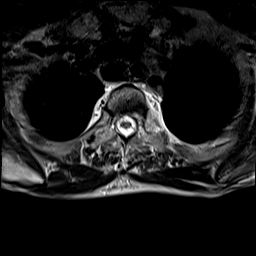
[im 43/43]
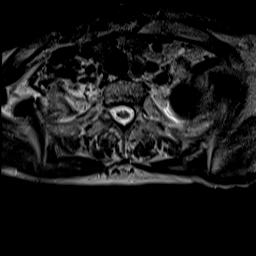

[Series 21: T1 · axial · 4.0mm · 0.39mm/px · z∈[-233,-28]mm · 8 of 43 slices shown (3 of 3)]
[im 1/43]
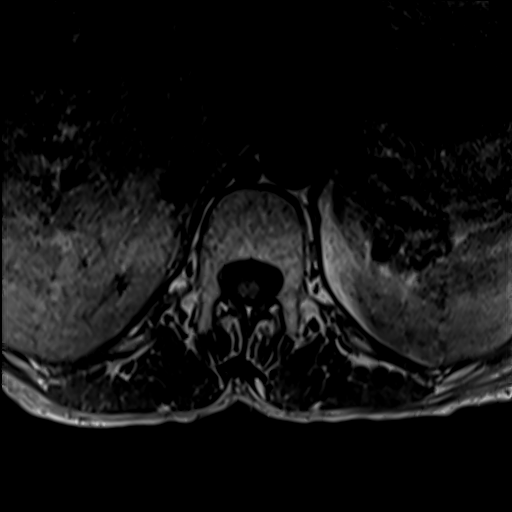
[im 6/43]
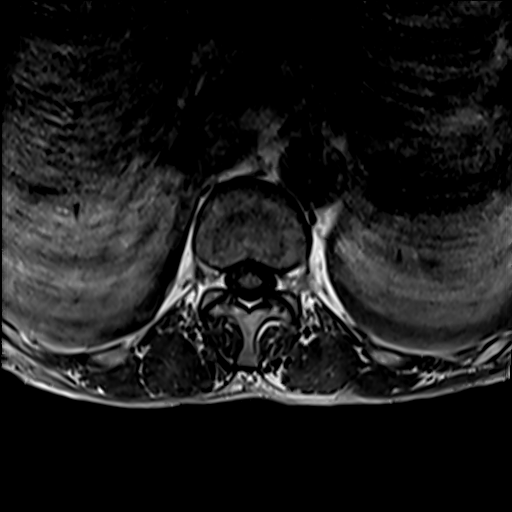
[im 11/43]
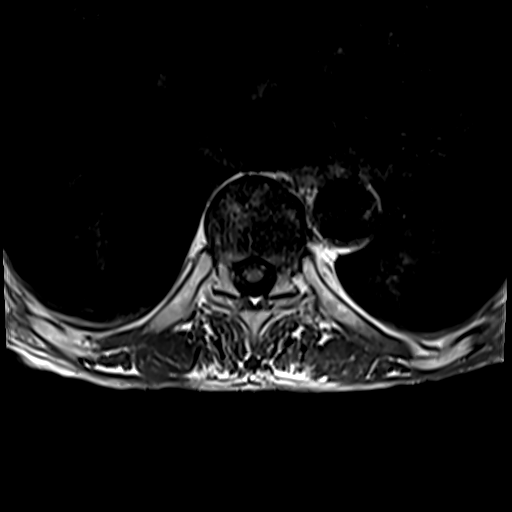
[im 16/43]
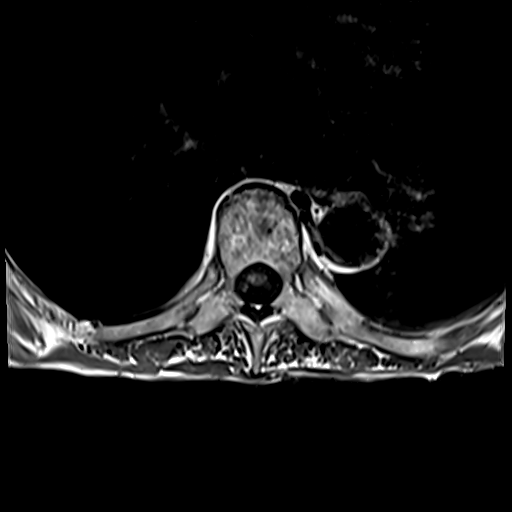
[im 27/43]
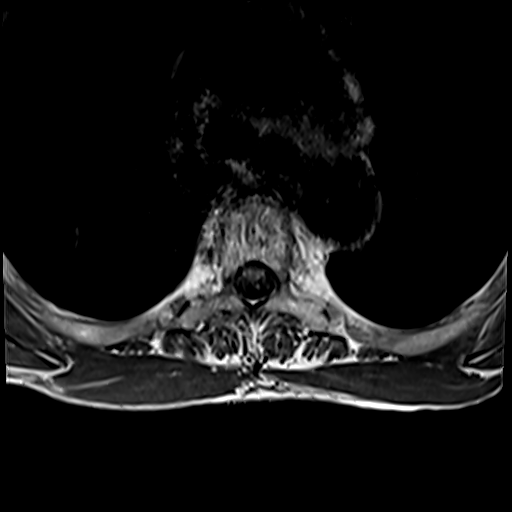
[im 32/43]
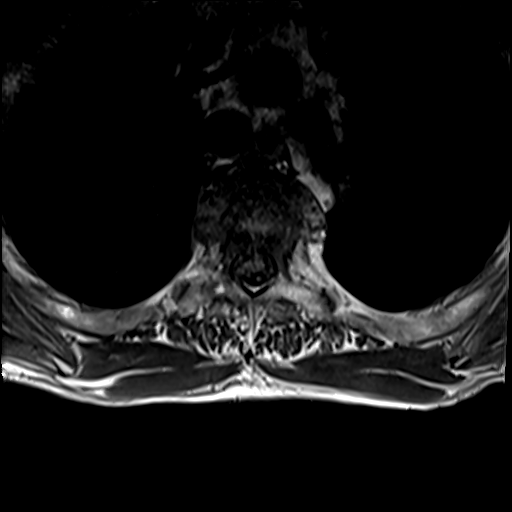
[im 37/43]
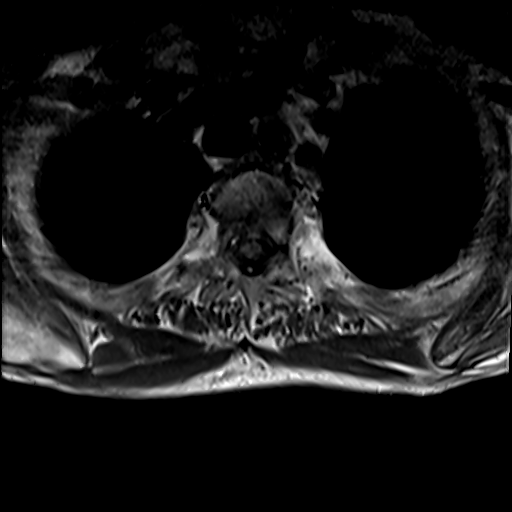
[im 43/43]
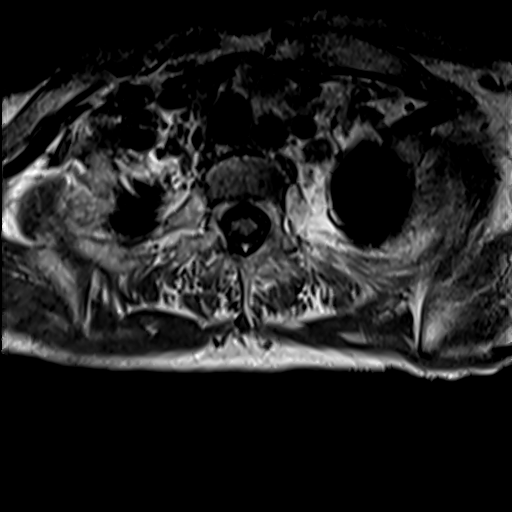

[Series 22: T2 post-contrast · sagittal · 3.0mm · 0.83mm/px · 3 of 15 slices shown]
[im 1/15]
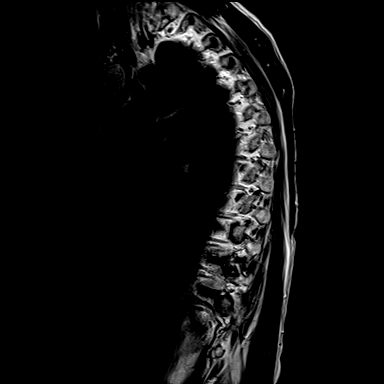
[im 8/15]
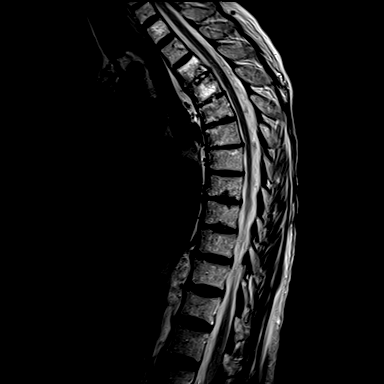
[im 15/15]
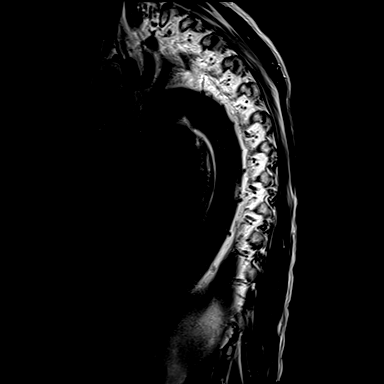

[Series 23: T1 fat-sat post-contrast · sagittal · 3.0mm · 1.00mm/px · 3 of 15 slices shown]
[im 1/15]
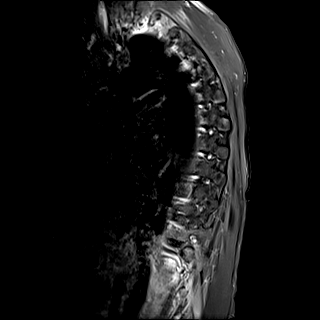
[im 8/15]
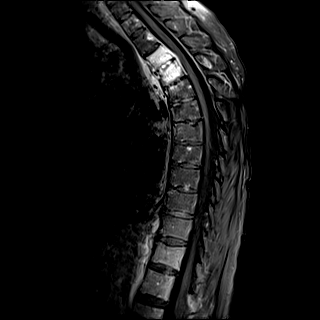
[im 15/15]
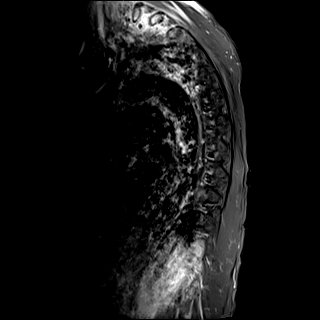

[Series 24: T1 post-contrast · axial · 4.0mm · 0.39mm/px · z∈[-233,-148]mm · 3 of 43 slices shown]
[im 1/43]
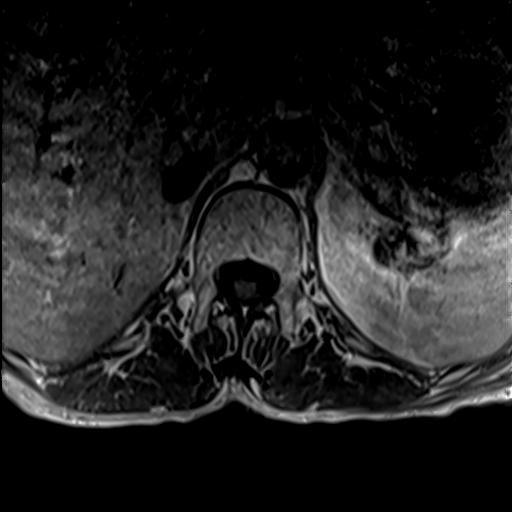
[im 6/43]
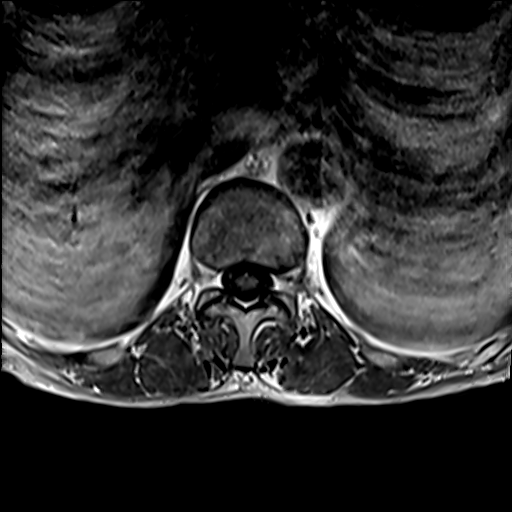
[im 11/43]
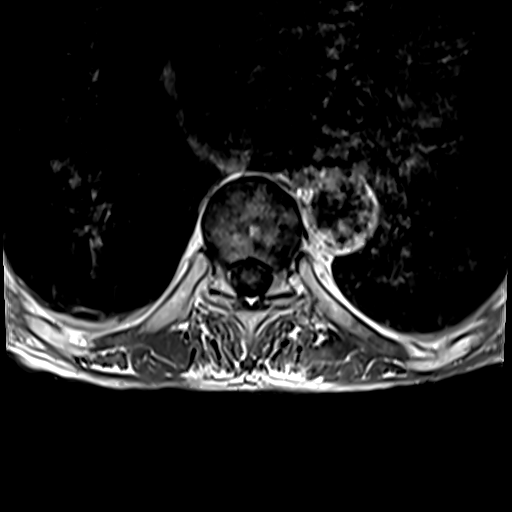

[32 of 48 positions shown; findings below may reference images not displayed]

FINDINGS: Alignment:  Preserved.

Vertebrae: There is abnormal marrow signal and enhancement at T3 and
T4 with endplate irregularity prior CT. There is also abnormal
signal at the posterior aspect of the T2 vertebral body and along
the superior T5 endplate.

Cord:  No abnormal signal.

Paraspinal and other soft tissues: Paraspinal edema and enhancement
centered at the above levels. No evidence of abscess.

Disc levels: Mild T2 hyperintensity and enhancement within the T3-T4
and T4-T5 disc spaces.

There is ventral epidural enhancement from T2-T3 to T4-T5 without
significant canal stenosis.
IMPRESSION: Evidence of discitis/osteomyelitis from T2-T5. Paravertebral and
ventral epidural phlegmon. No significant canal stenosis. No
abscess.

## 2022-10-16 IMAGING — DX DG ABD PORTABLE 1V
1 series · 1 of 1 positions shown · non-contrast
Comparison: 10/22/2020

CLINICAL DATA: Abdominal pain

EXAM:
PORTABLE ABDOMEN - 1 VIEW

[abdomen kub]
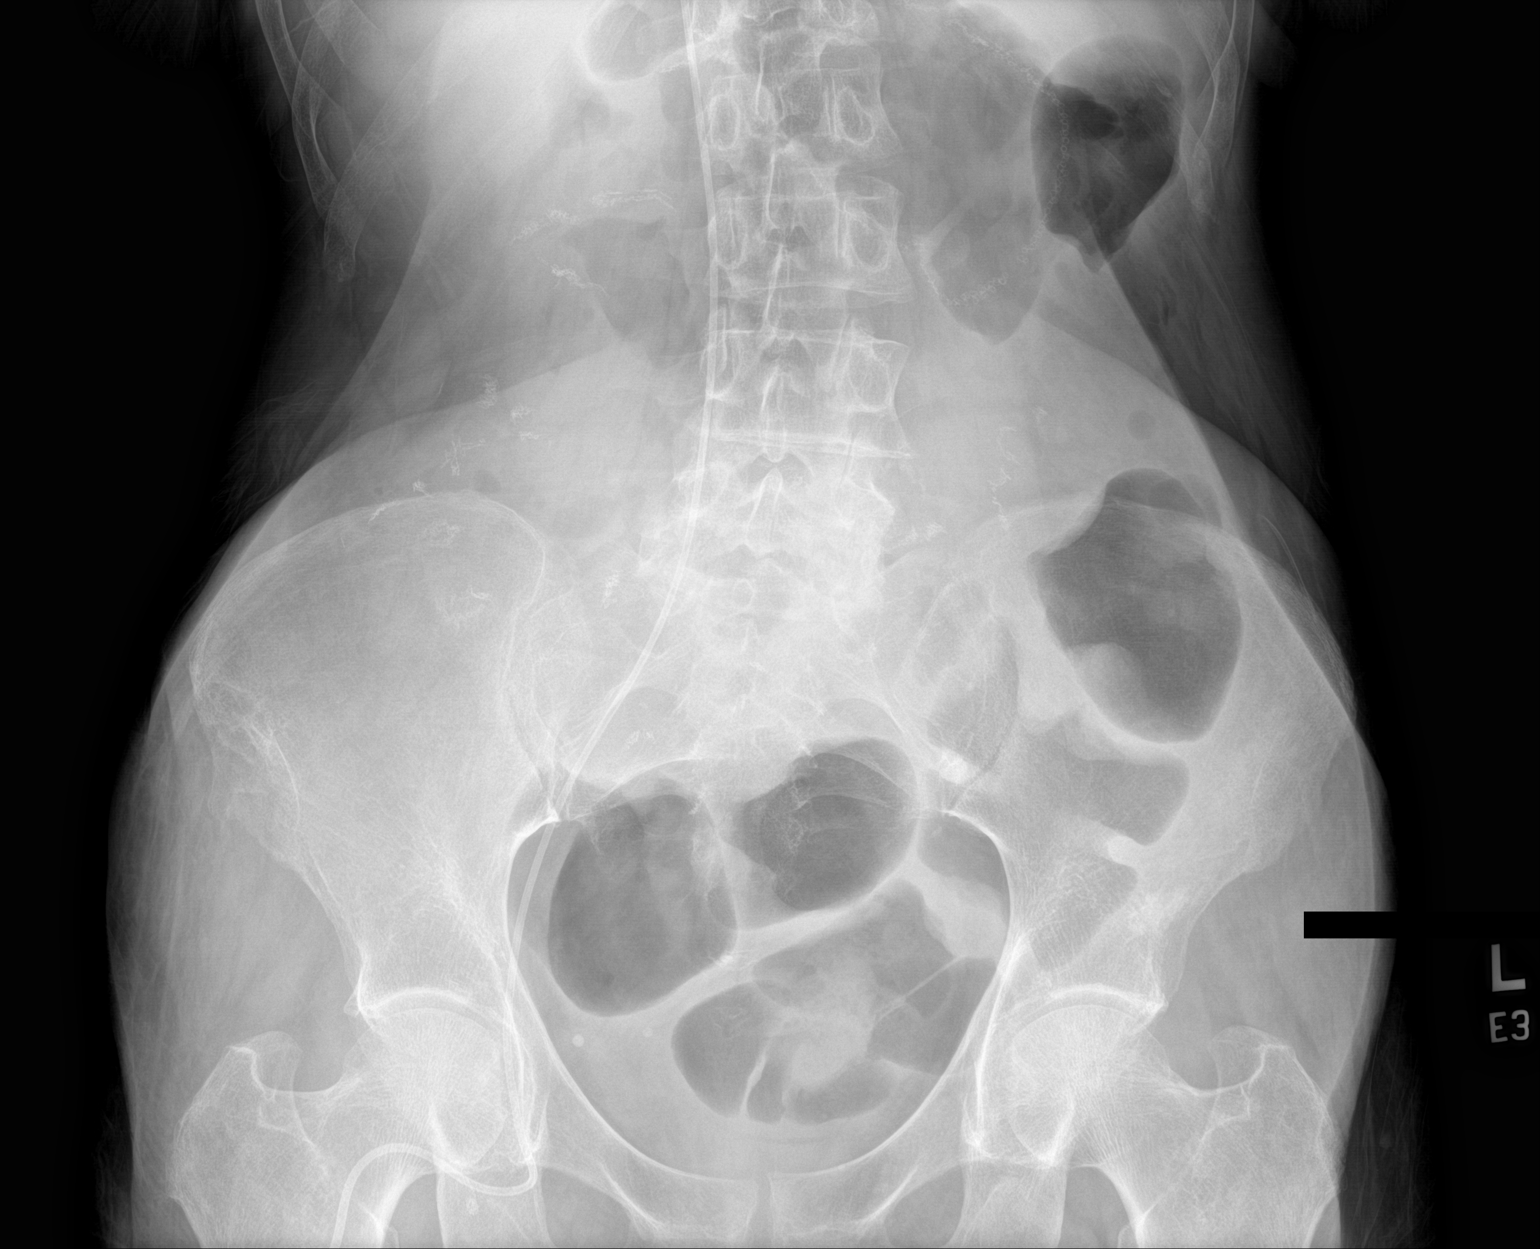

[1 of 1 positions shown; findings below may reference images not displayed]

FINDINGS: Scattered large and small bowel gas is noted. Postsurgical changes
are seen. Tunneled PICC line is noted from the right femoral
approach. No free air is seen. No obstructive changes are noted.
IMPRESSION: No acute abnormality seen.

## 2022-12-18 NOTE — Progress Notes (Signed)
This encounter was created in error - please disregard.

## 2023-07-28 DEATH — deceased
